# Patient Record
Sex: Male | Born: 1940 | Race: White | Hispanic: No | Marital: Married | Smoking: Never smoker
Health system: Southern US, Community
[De-identification: ages and names within clinical notes are randomized; demographics above are authoritative.]

## PROBLEM LIST (undated history)

## (undated) DIAGNOSIS — G4733 Obstructive sleep apnea (adult) (pediatric): Secondary | ICD-10-CM

## (undated) DIAGNOSIS — J4489 Other specified chronic obstructive pulmonary disease: Secondary | ICD-10-CM

## (undated) DIAGNOSIS — Z95810 Presence of automatic (implantable) cardiac defibrillator: Secondary | ICD-10-CM

## (undated) DIAGNOSIS — M109 Gout, unspecified: Secondary | ICD-10-CM

## (undated) DIAGNOSIS — J45909 Unspecified asthma, uncomplicated: Secondary | ICD-10-CM

## (undated) DIAGNOSIS — M47817 Spondylosis without myelopathy or radiculopathy, lumbosacral region: Secondary | ICD-10-CM

## (undated) DIAGNOSIS — R531 Weakness: Secondary | ICD-10-CM

## (undated) DIAGNOSIS — I214 Non-ST elevation (NSTEMI) myocardial infarction: Secondary | ICD-10-CM

## (undated) DIAGNOSIS — K449 Diaphragmatic hernia without obstruction or gangrene: Secondary | ICD-10-CM

## (undated) DIAGNOSIS — I350 Nonrheumatic aortic (valve) stenosis: Secondary | ICD-10-CM

## (undated) DIAGNOSIS — H409 Unspecified glaucoma: Secondary | ICD-10-CM

## (undated) DIAGNOSIS — I10 Essential (primary) hypertension: Secondary | ICD-10-CM

## (undated) DIAGNOSIS — K279 Peptic ulcer, site unspecified, unspecified as acute or chronic, without hemorrhage or perforation: Secondary | ICD-10-CM

## (undated) DIAGNOSIS — I951 Orthostatic hypotension: Secondary | ICD-10-CM

## (undated) DIAGNOSIS — E662 Morbid (severe) obesity with alveolar hypoventilation: Secondary | ICD-10-CM

## (undated) DIAGNOSIS — M170 Bilateral primary osteoarthritis of knee: Secondary | ICD-10-CM

## (undated) DIAGNOSIS — K219 Gastro-esophageal reflux disease without esophagitis: Secondary | ICD-10-CM

## (undated) DIAGNOSIS — I872 Venous insufficiency (chronic) (peripheral): Secondary | ICD-10-CM

## (undated) DIAGNOSIS — I495 Sick sinus syndrome: Secondary | ICD-10-CM

## (undated) DIAGNOSIS — E669 Obesity, unspecified: Secondary | ICD-10-CM

## (undated) DIAGNOSIS — M2010 Hallux valgus (acquired), unspecified foot: Secondary | ICD-10-CM

## (undated) DIAGNOSIS — G609 Hereditary and idiopathic neuropathy, unspecified: Secondary | ICD-10-CM

## (undated) DIAGNOSIS — Z789 Other specified health status: Secondary | ICD-10-CM

## (undated) DIAGNOSIS — D509 Iron deficiency anemia, unspecified: Secondary | ICD-10-CM

## (undated) DIAGNOSIS — R740 Nonspecific elevation of levels of transaminase and lactic acid dehydrogenase [LDH]: Secondary | ICD-10-CM

## (undated) DIAGNOSIS — Z86010 Personal history of colonic polyps: Secondary | ICD-10-CM

## (undated) DIAGNOSIS — K439 Ventral hernia without obstruction or gangrene: Secondary | ICD-10-CM

## (undated) DIAGNOSIS — G894 Chronic pain syndrome: Secondary | ICD-10-CM

## (undated) DIAGNOSIS — E291 Testicular hypofunction: Secondary | ICD-10-CM

## (undated) DIAGNOSIS — M1990 Unspecified osteoarthritis, unspecified site: Secondary | ICD-10-CM

## (undated) DIAGNOSIS — G629 Polyneuropathy, unspecified: Secondary | ICD-10-CM

## (undated) DIAGNOSIS — I2510 Atherosclerotic heart disease of native coronary artery without angina pectoris: Secondary | ICD-10-CM

## (undated) DIAGNOSIS — Z8669 Personal history of other diseases of the nervous system and sense organs: Secondary | ICD-10-CM

## (undated) DIAGNOSIS — E118 Type 2 diabetes mellitus with unspecified complications: Secondary | ICD-10-CM

## (undated) DIAGNOSIS — M5417 Radiculopathy, lumbosacral region: Secondary | ICD-10-CM

## (undated) DIAGNOSIS — N400 Enlarged prostate without lower urinary tract symptoms: Secondary | ICD-10-CM

## (undated) DIAGNOSIS — J449 Chronic obstructive pulmonary disease, unspecified: Secondary | ICD-10-CM

## (undated) DIAGNOSIS — Z8679 Personal history of other diseases of the circulatory system: Secondary | ICD-10-CM

## (undated) DIAGNOSIS — R5381 Other malaise: Secondary | ICD-10-CM

## (undated) DIAGNOSIS — J189 Pneumonia, unspecified organism: Secondary | ICD-10-CM

## (undated) DIAGNOSIS — N481 Balanitis: Secondary | ICD-10-CM

## (undated) DIAGNOSIS — D696 Thrombocytopenia, unspecified: Secondary | ICD-10-CM

## (undated) DIAGNOSIS — Z9989 Dependence on other enabling machines and devices: Secondary | ICD-10-CM

## (undated) DIAGNOSIS — I5042 Chronic combined systolic (congestive) and diastolic (congestive) heart failure: Secondary | ICD-10-CM

## (undated) DIAGNOSIS — R0602 Shortness of breath: Secondary | ICD-10-CM

## (undated) DIAGNOSIS — E7800 Pure hypercholesterolemia, unspecified: Secondary | ICD-10-CM

## (undated) DIAGNOSIS — D649 Anemia, unspecified: Secondary | ICD-10-CM

## (undated) DIAGNOSIS — I480 Paroxysmal atrial fibrillation: Secondary | ICD-10-CM

## (undated) DIAGNOSIS — K760 Fatty (change of) liver, not elsewhere classified: Secondary | ICD-10-CM

## (undated) HISTORY — PX: CARDIAC CATHETERIZATION: SHX172

## (undated) HISTORY — DX: Bilateral primary osteoarthritis of knee: M17.0

## (undated) HISTORY — DX: Testicular hypofunction: E29.1

## (undated) HISTORY — DX: Balanitis: N48.1

## (undated) HISTORY — DX: Atherosclerotic heart disease of native coronary artery without angina pectoris: I25.10

## (undated) HISTORY — DX: Peptic ulcer, site unspecified, unspecified as acute or chronic, without hemorrhage or perforation: K27.9

## (undated) HISTORY — DX: Other specified chronic obstructive pulmonary disease: J44.89

## (undated) HISTORY — DX: Anemia, unspecified: D64.9

## (undated) HISTORY — PX: LEG SURGERY: SHX1003

## (undated) HISTORY — DX: Orthostatic hypotension: I95.1

## (undated) HISTORY — DX: Obstructive sleep apnea (adult) (pediatric): G47.33

## (undated) HISTORY — PX: KNEE SURGERY: SHX244

## (undated) HISTORY — DX: Personal history of other diseases of the circulatory system: Z86.79

## (undated) HISTORY — DX: Dependence on other enabling machines and devices: Z99.89

## (undated) HISTORY — DX: Pure hypercholesterolemia, unspecified: E78.00

## (undated) HISTORY — DX: Sick sinus syndrome: I49.5

## (undated) HISTORY — DX: Morbid (severe) obesity with alveolar hypoventilation: E66.2

## (undated) HISTORY — DX: Iron deficiency anemia, unspecified: D50.9

## (undated) HISTORY — PX: OTHER SURGICAL HISTORY: SHX169

## (undated) HISTORY — DX: Personal history of other diseases of the nervous system and sense organs: Z86.69

## (undated) HISTORY — DX: Other specified health status: Z78.9

## (undated) HISTORY — DX: Weakness: R53.1

## (undated) HISTORY — DX: Morbid (severe) obesity due to excess calories: E66.01

## (undated) HISTORY — PX: CORONARY ANGIOPLASTY WITH STENT PLACEMENT: SHX49

## (undated) HISTORY — DX: Type 2 diabetes mellitus with unspecified complications: E11.8

## (undated) HISTORY — DX: Hereditary and idiopathic neuropathy, unspecified: G60.9

## (undated) HISTORY — DX: Ventral hernia without obstruction or gangrene: K43.9

## (undated) HISTORY — PX: TONSILLECTOMY: SUR1361

## (undated) HISTORY — PX: TRANSESOPHAGEAL ECHOCARDIOGRAM: SHX273

## (undated) HISTORY — PX: CARDIOVASCULAR STRESS TEST: SHX262

## (undated) HISTORY — DX: Other malaise: R53.81

## (undated) HISTORY — DX: Personal history of colonic polyps: Z86.010

## (undated) HISTORY — DX: Polyneuropathy, unspecified: G62.9

## (undated) HISTORY — PX: COLONOSCOPY: SHX174

## (undated) HISTORY — DX: Chronic combined systolic (congestive) and diastolic (congestive) heart failure: I50.42

## (undated) HISTORY — DX: Chronic pain syndrome: G89.4

## (undated) HISTORY — DX: Chronic obstructive pulmonary disease, unspecified: J44.9

## (undated) HISTORY — DX: Spondylosis without myelopathy or radiculopathy, lumbosacral region: M47.817

## (undated) HISTORY — DX: Unspecified glaucoma: H40.9

## (undated) HISTORY — DX: Obesity, unspecified: E66.9

## (undated) HISTORY — PX: INTRAOCULAR LENS INSERTION: SHX110

## (undated) HISTORY — DX: Radiculopathy, lumbosacral region: M54.17

## (undated) HISTORY — DX: Unspecified osteoarthritis, unspecified site: M19.90

## (undated) HISTORY — PX: EYE SURGERY: SHX253

## (undated) HISTORY — DX: Paroxysmal atrial fibrillation: I48.0

## (undated) HISTORY — PX: VITRECTOMY: SHX106

## (undated) HISTORY — DX: Thrombocytopenia, unspecified: D69.6

## (undated) HISTORY — PX: CHOLECYSTECTOMY: SHX55

## (undated) HISTORY — DX: Gout, unspecified: M10.9

## (undated) HISTORY — DX: Non-ST elevation (NSTEMI) myocardial infarction: I21.4

## (undated) HISTORY — DX: Essential (primary) hypertension: I10

## (undated) HISTORY — DX: Venous insufficiency (chronic) (peripheral): I87.2

## (undated) HISTORY — DX: Diaphragmatic hernia without obstruction or gangrene: K44.9

## (undated) HISTORY — DX: Nonrheumatic aortic (valve) stenosis: I35.0

## (undated) HISTORY — DX: Nonspecific elevation of levels of transaminase and lactic acid dehydrogenase (ldh): R74.0

## (undated) HISTORY — DX: Benign prostatic hyperplasia without lower urinary tract symptoms: N40.0

## (undated) HISTORY — PX: TOE AMPUTATION: SHX809

## (undated) HISTORY — DX: Presence of automatic (implantable) cardiac defibrillator: Z95.810

## (undated) HISTORY — DX: Hallux valgus (acquired), unspecified foot: M20.10

## (undated) HISTORY — PX: HEMORRHOID SURGERY: SHX153

## (undated) HISTORY — PX: TRANSTHORACIC ECHOCARDIOGRAM: SHX275

## (undated) HISTORY — DX: Fatty (change of) liver, not elsewhere classified: K76.0

## (undated) HISTORY — DX: Unspecified asthma, uncomplicated: J45.909

---

## 1998-04-06 ENCOUNTER — Encounter: Payer: Self-pay | Admitting: Emergency Medicine

## 1998-04-06 ENCOUNTER — Inpatient Hospital Stay (HOSPITAL_COMMUNITY): Admission: EM | Admit: 1998-04-06 | Discharge: 1998-04-07 | Payer: Self-pay | Admitting: Emergency Medicine

## 1998-09-21 ENCOUNTER — Encounter: Admission: RE | Admit: 1998-09-21 | Discharge: 1998-12-20 | Payer: Self-pay | Admitting: Anesthesiology

## 1998-11-12 ENCOUNTER — Ambulatory Visit (HOSPITAL_COMMUNITY): Admission: RE | Admit: 1998-11-12 | Discharge: 1998-11-12 | Payer: Self-pay | Admitting: Neurosurgery

## 1998-12-14 ENCOUNTER — Ambulatory Visit: Admission: RE | Admit: 1998-12-14 | Discharge: 1998-12-14 | Payer: Self-pay | Admitting: Internal Medicine

## 1999-01-24 ENCOUNTER — Inpatient Hospital Stay (HOSPITAL_COMMUNITY): Admission: EM | Admit: 1999-01-24 | Discharge: 1999-01-26 | Payer: Self-pay

## 1999-02-09 ENCOUNTER — Ambulatory Visit: Admission: RE | Admit: 1999-02-09 | Discharge: 1999-02-09 | Payer: Self-pay | Admitting: Internal Medicine

## 1999-02-09 ENCOUNTER — Encounter: Payer: Self-pay | Admitting: Internal Medicine

## 1999-02-21 ENCOUNTER — Encounter: Payer: Self-pay | Admitting: Internal Medicine

## 1999-02-21 ENCOUNTER — Ambulatory Visit (HOSPITAL_COMMUNITY): Admission: RE | Admit: 1999-02-21 | Discharge: 1999-02-21 | Payer: Self-pay | Admitting: Internal Medicine

## 1999-03-29 ENCOUNTER — Inpatient Hospital Stay (HOSPITAL_COMMUNITY): Admission: EM | Admit: 1999-03-29 | Discharge: 1999-03-30 | Payer: Self-pay | Admitting: Emergency Medicine

## 1999-03-29 ENCOUNTER — Encounter: Payer: Self-pay | Admitting: Emergency Medicine

## 1999-03-30 ENCOUNTER — Encounter: Payer: Self-pay | Admitting: Emergency Medicine

## 1999-04-11 ENCOUNTER — Encounter: Admission: RE | Admit: 1999-04-11 | Discharge: 1999-07-07 | Payer: Self-pay | Admitting: Anesthesiology

## 1999-06-29 ENCOUNTER — Encounter: Admission: RE | Admit: 1999-06-29 | Discharge: 1999-07-12 | Payer: Self-pay | Admitting: Anesthesiology

## 1999-07-07 ENCOUNTER — Encounter: Admission: RE | Admit: 1999-07-07 | Discharge: 1999-09-16 | Payer: Self-pay | Admitting: Anesthesiology

## 1999-08-17 ENCOUNTER — Ambulatory Visit (HOSPITAL_COMMUNITY): Admission: RE | Admit: 1999-08-17 | Discharge: 1999-08-17 | Payer: Self-pay | Admitting: Internal Medicine

## 1999-09-24 ENCOUNTER — Ambulatory Visit: Admission: RE | Admit: 1999-09-24 | Discharge: 1999-09-24 | Payer: Self-pay | Admitting: Internal Medicine

## 1999-11-30 ENCOUNTER — Observation Stay (HOSPITAL_COMMUNITY): Admission: EM | Admit: 1999-11-30 | Discharge: 1999-12-01 | Payer: Self-pay | Admitting: *Deleted

## 1999-12-14 ENCOUNTER — Encounter: Admission: RE | Admit: 1999-12-14 | Discharge: 2000-03-13 | Payer: Self-pay | Admitting: Anesthesiology

## 2000-04-05 ENCOUNTER — Encounter: Payer: Self-pay | Admitting: Orthopedic Surgery

## 2000-04-05 ENCOUNTER — Encounter: Admission: RE | Admit: 2000-04-05 | Discharge: 2000-04-05 | Payer: Self-pay | Admitting: Orthopedic Surgery

## 2000-04-19 ENCOUNTER — Encounter: Payer: Self-pay | Admitting: Orthopedic Surgery

## 2000-04-19 ENCOUNTER — Encounter: Admission: RE | Admit: 2000-04-19 | Discharge: 2000-04-19 | Payer: Self-pay | Admitting: Orthopedic Surgery

## 2000-04-23 ENCOUNTER — Ambulatory Visit (HOSPITAL_BASED_OUTPATIENT_CLINIC_OR_DEPARTMENT_OTHER): Admission: RE | Admit: 2000-04-23 | Discharge: 2000-04-23 | Payer: Self-pay | Admitting: Orthopedic Surgery

## 2000-06-14 ENCOUNTER — Ambulatory Visit (HOSPITAL_COMMUNITY): Admission: RE | Admit: 2000-06-14 | Discharge: 2000-06-14 | Payer: Self-pay | Admitting: Orthopedic Surgery

## 2000-11-09 ENCOUNTER — Ambulatory Visit (HOSPITAL_COMMUNITY): Admission: AD | Admit: 2000-11-09 | Discharge: 2000-11-10 | Payer: Self-pay | Admitting: Ophthalmology

## 2001-07-03 DIAGNOSIS — I480 Paroxysmal atrial fibrillation: Secondary | ICD-10-CM

## 2001-07-03 HISTORY — DX: Paroxysmal atrial fibrillation: I48.0

## 2001-11-08 ENCOUNTER — Inpatient Hospital Stay (HOSPITAL_COMMUNITY): Admission: EM | Admit: 2001-11-08 | Discharge: 2001-11-09 | Payer: Self-pay | Admitting: Emergency Medicine

## 2001-11-08 ENCOUNTER — Encounter: Payer: Self-pay | Admitting: Emergency Medicine

## 2003-01-12 ENCOUNTER — Encounter: Admission: RE | Admit: 2003-01-12 | Discharge: 2003-01-12 | Payer: Self-pay | Admitting: Orthopedic Surgery

## 2003-01-12 ENCOUNTER — Encounter: Payer: Self-pay | Admitting: Orthopedic Surgery

## 2004-05-10 ENCOUNTER — Ambulatory Visit: Payer: Self-pay | Admitting: Cardiology

## 2004-05-24 ENCOUNTER — Ambulatory Visit: Payer: Self-pay | Admitting: Internal Medicine

## 2004-05-31 ENCOUNTER — Ambulatory Visit: Payer: Self-pay | Admitting: Internal Medicine

## 2004-06-07 ENCOUNTER — Ambulatory Visit: Payer: Self-pay | Admitting: Cardiology

## 2004-06-14 ENCOUNTER — Ambulatory Visit: Payer: Self-pay | Admitting: Cardiology

## 2004-07-05 ENCOUNTER — Ambulatory Visit: Payer: Self-pay | Admitting: *Deleted

## 2004-10-12 ENCOUNTER — Encounter: Payer: Self-pay | Admitting: Cardiology

## 2004-10-25 ENCOUNTER — Ambulatory Visit: Payer: Self-pay | Admitting: Internal Medicine

## 2004-12-23 ENCOUNTER — Encounter: Payer: Self-pay | Admitting: Cardiology

## 2005-01-20 ENCOUNTER — Encounter: Payer: Self-pay | Admitting: Cardiology

## 2005-01-31 ENCOUNTER — Ambulatory Visit: Payer: Self-pay | Admitting: Internal Medicine

## 2005-02-01 ENCOUNTER — Ambulatory Visit: Payer: Self-pay | Admitting: Internal Medicine

## 2005-04-24 ENCOUNTER — Ambulatory Visit: Payer: Self-pay | Admitting: Internal Medicine

## 2005-04-28 ENCOUNTER — Ambulatory Visit: Payer: Self-pay | Admitting: Internal Medicine

## 2005-05-23 ENCOUNTER — Ambulatory Visit: Payer: Self-pay | Admitting: Internal Medicine

## 2005-06-01 ENCOUNTER — Ambulatory Visit: Payer: Self-pay

## 2005-07-03 DIAGNOSIS — K760 Fatty (change of) liver, not elsewhere classified: Secondary | ICD-10-CM

## 2005-07-03 HISTORY — DX: Fatty (change of) liver, not elsewhere classified: K76.0

## 2005-07-04 ENCOUNTER — Ambulatory Visit: Payer: Self-pay | Admitting: Internal Medicine

## 2005-07-11 ENCOUNTER — Ambulatory Visit: Payer: Self-pay | Admitting: Infectious Diseases

## 2005-07-11 ENCOUNTER — Inpatient Hospital Stay (HOSPITAL_COMMUNITY): Admission: EM | Admit: 2005-07-11 | Discharge: 2005-07-15 | Payer: Self-pay | Admitting: Emergency Medicine

## 2005-07-11 ENCOUNTER — Ambulatory Visit: Payer: Self-pay | Admitting: Internal Medicine

## 2005-07-24 ENCOUNTER — Ambulatory Visit: Payer: Self-pay | Admitting: Internal Medicine

## 2005-07-28 ENCOUNTER — Ambulatory Visit: Payer: Self-pay | Admitting: Infectious Diseases

## 2005-08-08 ENCOUNTER — Ambulatory Visit: Payer: Self-pay | Admitting: Infectious Diseases

## 2005-08-08 ENCOUNTER — Ambulatory Visit: Payer: Self-pay | Admitting: Endocrinology

## 2005-08-08 ENCOUNTER — Ambulatory Visit: Payer: Self-pay | Admitting: Cardiology

## 2005-08-08 ENCOUNTER — Inpatient Hospital Stay (HOSPITAL_COMMUNITY): Admission: EM | Admit: 2005-08-08 | Discharge: 2005-08-12 | Payer: Self-pay | Admitting: Emergency Medicine

## 2005-08-09 ENCOUNTER — Encounter: Payer: Self-pay | Admitting: Cardiology

## 2005-08-23 ENCOUNTER — Ambulatory Visit: Payer: Self-pay | Admitting: Internal Medicine

## 2005-08-31 ENCOUNTER — Ambulatory Visit: Payer: Self-pay | Admitting: Internal Medicine

## 2005-09-13 ENCOUNTER — Ambulatory Visit: Payer: Self-pay | Admitting: Internal Medicine

## 2006-02-01 ENCOUNTER — Ambulatory Visit: Payer: Self-pay | Admitting: Internal Medicine

## 2006-02-19 ENCOUNTER — Ambulatory Visit: Payer: Self-pay | Admitting: Internal Medicine

## 2006-04-02 ENCOUNTER — Encounter (INDEPENDENT_AMBULATORY_CARE_PROVIDER_SITE_OTHER): Payer: Self-pay | Admitting: *Deleted

## 2006-04-02 ENCOUNTER — Ambulatory Visit (HOSPITAL_COMMUNITY): Admission: RE | Admit: 2006-04-02 | Discharge: 2006-04-02 | Payer: Self-pay | Admitting: General Surgery

## 2006-04-12 ENCOUNTER — Encounter: Payer: Self-pay | Admitting: Cardiology

## 2006-05-16 ENCOUNTER — Ambulatory Visit (HOSPITAL_COMMUNITY): Admission: RE | Admit: 2006-05-16 | Discharge: 2006-05-17 | Payer: Self-pay | Admitting: Orthopedic Surgery

## 2006-05-25 ENCOUNTER — Ambulatory Visit: Payer: Self-pay | Admitting: Internal Medicine

## 2006-05-25 ENCOUNTER — Ambulatory Visit (HOSPITAL_COMMUNITY): Admission: RE | Admit: 2006-05-25 | Discharge: 2006-05-25 | Payer: Self-pay | Admitting: Internal Medicine

## 2006-05-25 LAB — CONVERTED CEMR LAB
Ketones, ur: NEGATIVE mg/dL
Leukocytes, UA: NEGATIVE
Nitrite: NEGATIVE
Nitrite: NEGATIVE
Specific Gravity, Urine: 1.02 (ref 1.000–1.03)
Total Protein, Urine: NEGATIVE mg/dL
Total Protein, Urine: NEGATIVE mg/dL
Urobilinogen, UA: 0.2 (ref 0.0–1.0)
Urobilinogen, UA: 0.2 (ref 0.0–1.0)
pH: 6.5 (ref 5.0–8.0)

## 2006-07-02 ENCOUNTER — Ambulatory Visit: Payer: Self-pay | Admitting: Internal Medicine

## 2006-08-08 ENCOUNTER — Ambulatory Visit (HOSPITAL_COMMUNITY): Admission: RE | Admit: 2006-08-08 | Discharge: 2006-08-09 | Payer: Self-pay | Admitting: Orthopedic Surgery

## 2006-10-25 ENCOUNTER — Inpatient Hospital Stay (HOSPITAL_COMMUNITY): Admission: EM | Admit: 2006-10-25 | Discharge: 2006-10-28 | Payer: Self-pay | Admitting: Emergency Medicine

## 2006-10-25 ENCOUNTER — Ambulatory Visit: Payer: Self-pay | Admitting: Internal Medicine

## 2006-11-01 ENCOUNTER — Ambulatory Visit: Payer: Self-pay | Admitting: Internal Medicine

## 2007-01-17 ENCOUNTER — Encounter: Payer: Self-pay | Admitting: Internal Medicine

## 2007-01-17 DIAGNOSIS — K219 Gastro-esophageal reflux disease without esophagitis: Secondary | ICD-10-CM | POA: Insufficient documentation

## 2007-01-17 DIAGNOSIS — E669 Obesity, unspecified: Secondary | ICD-10-CM | POA: Insufficient documentation

## 2007-01-17 DIAGNOSIS — I272 Pulmonary hypertension, unspecified: Secondary | ICD-10-CM | POA: Insufficient documentation

## 2007-01-17 DIAGNOSIS — G4733 Obstructive sleep apnea (adult) (pediatric): Secondary | ICD-10-CM | POA: Insufficient documentation

## 2007-01-17 DIAGNOSIS — Z8679 Personal history of other diseases of the circulatory system: Secondary | ICD-10-CM

## 2007-01-17 DIAGNOSIS — J45909 Unspecified asthma, uncomplicated: Secondary | ICD-10-CM | POA: Insufficient documentation

## 2007-01-17 DIAGNOSIS — I83009 Varicose veins of unspecified lower extremity with ulcer of unspecified site: Secondary | ICD-10-CM | POA: Insufficient documentation

## 2007-01-17 DIAGNOSIS — L97909 Non-pressure chronic ulcer of unspecified part of unspecified lower leg with unspecified severity: Secondary | ICD-10-CM | POA: Insufficient documentation

## 2007-01-17 DIAGNOSIS — M2010 Hallux valgus (acquired), unspecified foot: Secondary | ICD-10-CM | POA: Insufficient documentation

## 2007-07-23 ENCOUNTER — Encounter: Payer: Self-pay | Admitting: Internal Medicine

## 2007-10-02 ENCOUNTER — Encounter: Payer: Self-pay | Admitting: Internal Medicine

## 2008-01-09 ENCOUNTER — Inpatient Hospital Stay (HOSPITAL_COMMUNITY): Admission: AD | Admit: 2008-01-09 | Discharge: 2008-01-12 | Payer: Self-pay | Admitting: Orthopedic Surgery

## 2008-01-30 ENCOUNTER — Encounter: Payer: Self-pay | Admitting: Internal Medicine

## 2008-02-26 ENCOUNTER — Encounter: Payer: Self-pay | Admitting: Cardiology

## 2008-02-26 ENCOUNTER — Encounter: Payer: Self-pay | Admitting: Internal Medicine

## 2008-05-21 ENCOUNTER — Telehealth: Payer: Self-pay | Admitting: Internal Medicine

## 2008-05-21 ENCOUNTER — Ambulatory Visit: Payer: Self-pay | Admitting: Internal Medicine

## 2008-06-29 ENCOUNTER — Telehealth: Payer: Self-pay | Admitting: Internal Medicine

## 2008-07-01 ENCOUNTER — Telehealth: Payer: Self-pay | Admitting: Internal Medicine

## 2008-07-08 ENCOUNTER — Telehealth: Payer: Self-pay | Admitting: Internal Medicine

## 2008-07-29 ENCOUNTER — Telehealth: Payer: Self-pay | Admitting: Internal Medicine

## 2008-08-05 ENCOUNTER — Encounter: Payer: Self-pay | Admitting: Internal Medicine

## 2008-08-05 ENCOUNTER — Telehealth (INDEPENDENT_AMBULATORY_CARE_PROVIDER_SITE_OTHER): Payer: Self-pay | Admitting: *Deleted

## 2008-09-03 ENCOUNTER — Encounter: Payer: Self-pay | Admitting: Internal Medicine

## 2008-09-11 ENCOUNTER — Telehealth: Payer: Self-pay | Admitting: Internal Medicine

## 2008-09-14 ENCOUNTER — Encounter: Payer: Self-pay | Admitting: Cardiology

## 2008-09-16 ENCOUNTER — Telehealth: Payer: Self-pay | Admitting: Internal Medicine

## 2008-09-25 ENCOUNTER — Encounter: Payer: Self-pay | Admitting: Cardiology

## 2008-12-17 ENCOUNTER — Encounter: Payer: Self-pay | Admitting: Cardiology

## 2008-12-17 ENCOUNTER — Ambulatory Visit: Payer: Self-pay | Admitting: Internal Medicine

## 2008-12-24 ENCOUNTER — Encounter: Payer: Self-pay | Admitting: Internal Medicine

## 2008-12-25 ENCOUNTER — Telehealth: Payer: Self-pay | Admitting: Internal Medicine

## 2008-12-25 ENCOUNTER — Ambulatory Visit (HOSPITAL_COMMUNITY): Admission: EM | Admit: 2008-12-25 | Discharge: 2008-12-25 | Payer: Self-pay | Admitting: Emergency Medicine

## 2008-12-25 ENCOUNTER — Ambulatory Visit: Payer: Self-pay | Admitting: Internal Medicine

## 2008-12-25 ENCOUNTER — Telehealth (INDEPENDENT_AMBULATORY_CARE_PROVIDER_SITE_OTHER): Payer: Self-pay

## 2009-01-12 DIAGNOSIS — K439 Ventral hernia without obstruction or gangrene: Secondary | ICD-10-CM | POA: Insufficient documentation

## 2009-01-12 DIAGNOSIS — M1990 Unspecified osteoarthritis, unspecified site: Secondary | ICD-10-CM | POA: Insufficient documentation

## 2009-01-12 DIAGNOSIS — Z8679 Personal history of other diseases of the circulatory system: Secondary | ICD-10-CM | POA: Insufficient documentation

## 2009-01-12 DIAGNOSIS — M129 Arthropathy, unspecified: Secondary | ICD-10-CM | POA: Insufficient documentation

## 2009-01-12 DIAGNOSIS — H409 Unspecified glaucoma: Secondary | ICD-10-CM | POA: Insufficient documentation

## 2009-01-12 DIAGNOSIS — IMO0002 Reserved for concepts with insufficient information to code with codable children: Secondary | ICD-10-CM | POA: Insufficient documentation

## 2009-01-12 DIAGNOSIS — I872 Venous insufficiency (chronic) (peripheral): Secondary | ICD-10-CM | POA: Insufficient documentation

## 2009-01-12 DIAGNOSIS — M109 Gout, unspecified: Secondary | ICD-10-CM | POA: Insufficient documentation

## 2009-01-12 DIAGNOSIS — Z87898 Personal history of other specified conditions: Secondary | ICD-10-CM | POA: Insufficient documentation

## 2009-01-12 DIAGNOSIS — J449 Chronic obstructive pulmonary disease, unspecified: Secondary | ICD-10-CM | POA: Insufficient documentation

## 2009-01-12 DIAGNOSIS — Z8669 Personal history of other diseases of the nervous system and sense organs: Secondary | ICD-10-CM | POA: Insufficient documentation

## 2009-01-13 ENCOUNTER — Ambulatory Visit: Payer: Self-pay | Admitting: Cardiology

## 2009-01-13 ENCOUNTER — Encounter (INDEPENDENT_AMBULATORY_CARE_PROVIDER_SITE_OTHER): Payer: Self-pay | Admitting: *Deleted

## 2009-01-13 ENCOUNTER — Encounter: Payer: Self-pay | Admitting: Cardiology

## 2009-01-13 DIAGNOSIS — E7800 Pure hypercholesterolemia, unspecified: Secondary | ICD-10-CM | POA: Insufficient documentation

## 2009-01-13 DIAGNOSIS — R609 Edema, unspecified: Secondary | ICD-10-CM | POA: Insufficient documentation

## 2009-01-13 DIAGNOSIS — I10 Essential (primary) hypertension: Secondary | ICD-10-CM | POA: Insufficient documentation

## 2009-01-22 ENCOUNTER — Encounter: Payer: Self-pay | Admitting: Cardiology

## 2009-01-22 ENCOUNTER — Encounter: Payer: Self-pay | Admitting: Internal Medicine

## 2009-01-27 LAB — CONVERTED CEMR LAB: INR: 3.1 — ABNORMAL HIGH (ref 0.0–1.5)

## 2009-02-01 ENCOUNTER — Telehealth: Payer: Self-pay | Admitting: Cardiology

## 2009-02-22 ENCOUNTER — Telehealth: Payer: Self-pay | Admitting: Internal Medicine

## 2009-02-25 ENCOUNTER — Encounter: Payer: Self-pay | Admitting: Internal Medicine

## 2009-02-25 LAB — CONVERTED CEMR LAB: Prothrombin Time: 27 s — ABNORMAL HIGH (ref 11.6–15.2)

## 2009-03-01 ENCOUNTER — Telehealth: Payer: Self-pay | Admitting: Internal Medicine

## 2009-03-11 ENCOUNTER — Encounter: Payer: Self-pay | Admitting: Internal Medicine

## 2009-03-16 ENCOUNTER — Encounter: Payer: Self-pay | Admitting: Internal Medicine

## 2009-03-17 ENCOUNTER — Encounter: Payer: Self-pay | Admitting: Internal Medicine

## 2009-03-17 LAB — CONVERTED CEMR LAB: Prothrombin Time: 21.4 s — ABNORMAL HIGH (ref 11.6–15.2)

## 2009-03-18 ENCOUNTER — Telehealth: Payer: Self-pay | Admitting: Internal Medicine

## 2009-03-30 ENCOUNTER — Encounter: Payer: Self-pay | Admitting: Internal Medicine

## 2009-03-30 ENCOUNTER — Encounter: Payer: Self-pay | Admitting: Cardiology

## 2009-03-30 ENCOUNTER — Telehealth: Payer: Self-pay | Admitting: Internal Medicine

## 2009-03-30 LAB — CONVERTED CEMR LAB
ALT: 16 units/L (ref 0–53)
Alkaline Phosphatase: 53 units/L (ref 39–117)
Bilirubin, Direct: 0.2 mg/dL (ref 0.0–0.3)
Cholesterol: 112 mg/dL (ref 0–200)
LDL Cholesterol: 30 mg/dL (ref 0–99)
Prothrombin Time: 21.5 s — ABNORMAL HIGH (ref 10.2–14.0)
Total Protein: 6.2 g/dL (ref 6.0–8.3)

## 2009-03-31 ENCOUNTER — Encounter: Payer: Self-pay | Admitting: Internal Medicine

## 2009-04-01 ENCOUNTER — Encounter: Payer: Self-pay | Admitting: Internal Medicine

## 2009-04-27 ENCOUNTER — Encounter: Payer: Self-pay | Admitting: Internal Medicine

## 2009-04-27 LAB — CONVERTED CEMR LAB
INR: 2.42 — ABNORMAL HIGH (ref <1.50)
Prothrombin Time: 20.8 s — ABNORMAL HIGH (ref 10.2–14.0)

## 2009-05-05 ENCOUNTER — Encounter: Admission: RE | Admit: 2009-05-05 | Discharge: 2009-05-05 | Payer: Self-pay | Admitting: Orthopedic Surgery

## 2009-05-20 ENCOUNTER — Encounter: Payer: Self-pay | Admitting: Internal Medicine

## 2009-05-20 LAB — CONVERTED CEMR LAB: INR: 1.37 (ref <1.50)

## 2009-06-01 ENCOUNTER — Telehealth: Payer: Self-pay | Admitting: Internal Medicine

## 2009-06-02 ENCOUNTER — Encounter: Payer: Self-pay | Admitting: Internal Medicine

## 2009-06-02 LAB — CONVERTED CEMR LAB
INR: 2.91 — ABNORMAL HIGH (ref <1.50)
Prothrombin Time: 23.3 s — ABNORMAL HIGH (ref 10.2–14.0)

## 2009-06-05 ENCOUNTER — Telehealth: Payer: Self-pay | Admitting: Internal Medicine

## 2009-06-08 ENCOUNTER — Telehealth: Payer: Self-pay | Admitting: Internal Medicine

## 2009-06-30 ENCOUNTER — Telehealth: Payer: Self-pay | Admitting: Internal Medicine

## 2009-07-06 ENCOUNTER — Encounter: Payer: Self-pay | Admitting: Internal Medicine

## 2009-07-09 ENCOUNTER — Telehealth: Payer: Self-pay | Admitting: Internal Medicine

## 2009-07-17 ENCOUNTER — Encounter: Payer: Self-pay | Admitting: Internal Medicine

## 2009-08-04 ENCOUNTER — Encounter: Payer: Self-pay | Admitting: Internal Medicine

## 2009-08-04 LAB — CONVERTED CEMR LAB: INR: 2.91 — ABNORMAL HIGH (ref <1.50)

## 2009-08-31 ENCOUNTER — Telehealth: Payer: Self-pay | Admitting: Internal Medicine

## 2009-08-31 ENCOUNTER — Encounter: Payer: Self-pay | Admitting: Internal Medicine

## 2009-09-02 ENCOUNTER — Encounter: Payer: Self-pay | Admitting: Internal Medicine

## 2009-09-08 ENCOUNTER — Ambulatory Visit: Payer: Self-pay | Admitting: Internal Medicine

## 2009-09-08 DIAGNOSIS — R0600 Dyspnea, unspecified: Secondary | ICD-10-CM | POA: Insufficient documentation

## 2009-09-08 DIAGNOSIS — R0609 Other forms of dyspnea: Secondary | ICD-10-CM

## 2009-09-10 ENCOUNTER — Encounter: Payer: Self-pay | Admitting: Cardiology

## 2009-09-10 ENCOUNTER — Encounter: Payer: Self-pay | Admitting: Internal Medicine

## 2009-09-11 ENCOUNTER — Encounter: Payer: Self-pay | Admitting: Cardiology

## 2009-09-13 ENCOUNTER — Encounter: Payer: Self-pay | Admitting: Internal Medicine

## 2009-09-23 ENCOUNTER — Encounter: Payer: Self-pay | Admitting: Internal Medicine

## 2009-09-24 LAB — CONVERTED CEMR LAB
INR: 2.03 — ABNORMAL HIGH (ref <1.50)
Prothrombin Time: 18.7 s — ABNORMAL HIGH (ref 10.2–14.0)

## 2009-10-13 ENCOUNTER — Encounter: Payer: Self-pay | Admitting: Cardiology

## 2009-10-13 ENCOUNTER — Ambulatory Visit: Payer: Self-pay | Admitting: Cardiology

## 2009-10-14 ENCOUNTER — Telehealth (INDEPENDENT_AMBULATORY_CARE_PROVIDER_SITE_OTHER): Payer: Self-pay | Admitting: *Deleted

## 2009-10-27 ENCOUNTER — Encounter: Payer: Self-pay | Admitting: Internal Medicine

## 2009-10-27 ENCOUNTER — Telehealth: Payer: Self-pay | Admitting: Internal Medicine

## 2009-10-27 LAB — CONVERTED CEMR LAB
INR: 3.03 — ABNORMAL HIGH
Prothrombin Time: 23.9 s — ABNORMAL HIGH

## 2009-11-01 ENCOUNTER — Encounter (INDEPENDENT_AMBULATORY_CARE_PROVIDER_SITE_OTHER): Payer: Self-pay | Admitting: *Deleted

## 2009-11-25 ENCOUNTER — Encounter: Payer: Self-pay | Admitting: Internal Medicine

## 2009-11-26 ENCOUNTER — Telehealth: Payer: Self-pay | Admitting: Internal Medicine

## 2009-12-02 ENCOUNTER — Encounter: Payer: Self-pay | Admitting: Internal Medicine

## 2009-12-09 ENCOUNTER — Encounter: Payer: Self-pay | Admitting: Internal Medicine

## 2009-12-27 ENCOUNTER — Encounter: Payer: Self-pay | Admitting: Internal Medicine

## 2010-01-05 ENCOUNTER — Telehealth (INDEPENDENT_AMBULATORY_CARE_PROVIDER_SITE_OTHER): Payer: Self-pay | Admitting: *Deleted

## 2010-01-05 ENCOUNTER — Telehealth: Payer: Self-pay | Admitting: Internal Medicine

## 2010-01-05 LAB — CONVERTED CEMR LAB
INR: 1.99 — ABNORMAL HIGH (ref <1.50)
INR: 3.11 — ABNORMAL HIGH (ref <1.50)
Prothrombin Time: 22.3 s — ABNORMAL HIGH (ref 11.6–15.2)
Prothrombin Time: 31.4 s — ABNORMAL HIGH (ref 11.6–15.2)

## 2010-01-13 ENCOUNTER — Ambulatory Visit: Payer: Self-pay | Admitting: Internal Medicine

## 2010-01-13 LAB — CONVERTED CEMR LAB: POC INR: 4.2

## 2010-01-27 ENCOUNTER — Ambulatory Visit: Payer: Self-pay | Admitting: Internal Medicine

## 2010-02-08 ENCOUNTER — Telehealth: Payer: Self-pay | Admitting: Internal Medicine

## 2010-02-14 ENCOUNTER — Telehealth: Payer: Self-pay | Admitting: Internal Medicine

## 2010-02-15 ENCOUNTER — Telehealth: Payer: Self-pay | Admitting: Internal Medicine

## 2010-02-22 ENCOUNTER — Ambulatory Visit: Payer: Self-pay | Admitting: Cardiovascular Disease

## 2010-02-23 ENCOUNTER — Encounter
Admission: RE | Admit: 2010-02-23 | Discharge: 2010-05-24 | Payer: Self-pay | Admitting: Physical Medicine & Rehabilitation

## 2010-02-24 ENCOUNTER — Encounter: Admission: RE | Admit: 2010-02-24 | Discharge: 2010-02-24 | Payer: Self-pay | Admitting: Orthopedic Surgery

## 2010-03-01 ENCOUNTER — Encounter: Payer: Self-pay | Admitting: Cardiology

## 2010-03-01 ENCOUNTER — Ambulatory Visit: Payer: Self-pay | Admitting: Physical Medicine & Rehabilitation

## 2010-03-16 ENCOUNTER — Ambulatory Visit: Payer: Self-pay | Admitting: Internal Medicine

## 2010-03-16 LAB — CONVERTED CEMR LAB: POC INR: 1.4

## 2010-03-18 ENCOUNTER — Ambulatory Visit: Payer: Self-pay | Admitting: Physical Medicine & Rehabilitation

## 2010-03-18 ENCOUNTER — Ambulatory Visit: Payer: Self-pay | Admitting: Cardiology

## 2010-03-18 LAB — CONVERTED CEMR LAB: POC INR: 1

## 2010-03-23 ENCOUNTER — Ambulatory Visit: Payer: Self-pay | Admitting: Cardiology

## 2010-03-25 ENCOUNTER — Ambulatory Visit: Payer: Self-pay | Admitting: Internal Medicine

## 2010-04-06 ENCOUNTER — Ambulatory Visit: Payer: Self-pay | Admitting: Cardiovascular Disease

## 2010-04-07 ENCOUNTER — Ambulatory Visit: Payer: Self-pay | Admitting: Physical Medicine & Rehabilitation

## 2010-04-21 ENCOUNTER — Ambulatory Visit: Payer: Self-pay | Admitting: Physical Medicine & Rehabilitation

## 2010-04-21 ENCOUNTER — Ambulatory Visit: Payer: Self-pay | Admitting: Internal Medicine

## 2010-04-28 ENCOUNTER — Ambulatory Visit: Payer: Self-pay | Admitting: Internal Medicine

## 2010-05-03 ENCOUNTER — Emergency Department (HOSPITAL_COMMUNITY): Admission: EM | Admit: 2010-05-03 | Discharge: 2010-05-03 | Payer: Self-pay | Admitting: Emergency Medicine

## 2010-05-11 ENCOUNTER — Ambulatory Visit: Payer: Self-pay | Admitting: Cardiovascular Disease

## 2010-05-11 LAB — CONVERTED CEMR LAB: POC INR: 3.1

## 2010-05-16 ENCOUNTER — Telehealth: Payer: Self-pay | Admitting: Internal Medicine

## 2010-06-01 ENCOUNTER — Ambulatory Visit: Payer: Self-pay | Admitting: Internal Medicine

## 2010-06-01 ENCOUNTER — Encounter
Admission: RE | Admit: 2010-06-01 | Discharge: 2010-06-30 | Payer: Self-pay | Source: Home / Self Care | Attending: Physical Medicine & Rehabilitation | Admitting: Physical Medicine & Rehabilitation

## 2010-06-09 ENCOUNTER — Encounter: Payer: Self-pay | Admitting: Internal Medicine

## 2010-06-09 ENCOUNTER — Ambulatory Visit: Payer: Self-pay | Admitting: Physical Medicine & Rehabilitation

## 2010-06-22 ENCOUNTER — Ambulatory Visit: Payer: Self-pay | Admitting: Cardiology

## 2010-06-22 LAB — CONVERTED CEMR LAB: POC INR: 2.2

## 2010-06-23 ENCOUNTER — Encounter: Payer: Self-pay | Admitting: Cardiology

## 2010-06-23 ENCOUNTER — Encounter: Payer: Self-pay | Admitting: Internal Medicine

## 2010-07-05 ENCOUNTER — Other Ambulatory Visit: Payer: Self-pay | Admitting: Internal Medicine

## 2010-07-05 ENCOUNTER — Ambulatory Visit
Admission: RE | Admit: 2010-07-05 | Discharge: 2010-07-05 | Payer: Self-pay | Source: Home / Self Care | Attending: Internal Medicine | Admitting: Internal Medicine

## 2010-07-05 LAB — HEMOGLOBIN A1C: Hgb A1c MFr Bld: 5.8 % (ref 4.6–6.5)

## 2010-07-06 ENCOUNTER — Telehealth: Payer: Self-pay | Admitting: Internal Medicine

## 2010-07-08 ENCOUNTER — Encounter: Payer: Self-pay | Admitting: Cardiology

## 2010-07-08 ENCOUNTER — Encounter
Admission: RE | Admit: 2010-07-08 | Discharge: 2010-08-02 | Payer: Self-pay | Source: Home / Self Care | Attending: Physical Medicine & Rehabilitation | Admitting: Physical Medicine & Rehabilitation

## 2010-07-08 ENCOUNTER — Ambulatory Visit
Admission: RE | Admit: 2010-07-08 | Discharge: 2010-07-08 | Payer: Self-pay | Source: Home / Self Care | Attending: Physical Medicine & Rehabilitation | Admitting: Physical Medicine & Rehabilitation

## 2010-07-20 ENCOUNTER — Ambulatory Visit: Admission: RE | Admit: 2010-07-20 | Discharge: 2010-07-20 | Payer: Self-pay | Source: Home / Self Care

## 2010-07-20 LAB — CONVERTED CEMR LAB: POC INR: 3.3

## 2010-07-26 NOTE — Assessment & Plan Note (Addendum)
CHIEF COMPLAINT:  Lower extremity pain.  A 70 year old male who I have been seeing for his multiple chronic pain issues.  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  He has evidence of polyneuropathy as well.  Appears to be axonal in the upper mainly sensory and lower appears to be sensory motor, although limited study in the lowers due to severe edema.  Pain is worse with walking, bending, sitting, standing.  Improves with rest, medications, and injections.  He can walk 5 minutes at a time, can climb steps and drives.  REVIEW OF SYSTEMS:  Positive for limb swelling, numbness, shortness of breath, sleep apnea.  Other medical problems include hypertension, COPD, glaucoma.  SOCIAL HISTORY:  Married.  PHYSICAL EXAMINATION:  VITAL SIGNS:  Blood pressure 143/66, pulse 81, respirations 18, o2 sat 98% on room air. GENERAL:  Mood and affect are appropriate. MUSCULOSKELETAL:  His back has no tenderness to palpation.  He has negative straight leg raising.  He has bilaterally decreased S1 reflexes as well as decreased patellar reflexes.  IMPRESSION: 1. S1 radiculitis, set him up for repeat S1 injections, will need to     be off Coumadin.  Discussed with patient, agrees with plan. 2. Continue his current medications including Ultracet one p.o. t.i.d.     and gabapentin 400 t.i.d.     Charlett Blake, M.D. Electronically Signed    AEK/MedQ D:  07/08/2010 17:06:49  T:  07/09/2010 01:20:52  Job #:  [id number]

## 2010-08-02 NOTE — Progress Notes (Signed)
  Phone Note Refill Request Message from:  Fax from Pharmacy on February 08, 2010 4:40 PM  Refills Requested: Medication #1:  OMEPRAZOLE 10 MG CPDR Take 1 capsule by mouth once a day Initial call taken by: Ami Bullins CMA,  February 08, 2010 4:40 PM    Prescriptions: OMEPRAZOLE 10 MG CPDR (OMEPRAZOLE) Take 1 capsule by mouth once a day  #30 x 4   Entered by:   Ami Bullins CMA   Authorized by:   Neena Rhymes MD   Signed by:   Charlynne Cousins CMA on 02/08/2010   Method used:   Electronically to        [id number] (retail)       [address]       [place], [place]  [address]       Ph: [phone]       Fax: [phone]   RxID:   [id number]

## 2010-08-02 NOTE — Assessment & Plan Note (Signed)
Summary: [facility]   Visit Type:  Follow-up Primary Provider:  Neena Rhymes MD  CC:  Sob.  History of Present Illness: 70 yo male who I have seen in the past for paroxysmal atrial fibrillation, sleep apnea, and pedal edema. Note he did have a cardiac catheterization in March of 1998 that showed normal LV function and a 30-50% right coronary artery. A nuclear study was performed in May of 2004 that showed inferior thinning but no ischemia. His ejection fraction was 57%. Last echocardiogram in February of 2007 showed normal LV function, diastolic dysfunction, trivial mitral regurgitation, probable PFO. Following 2004 he transferred his care to [facility] for convenience; however he now prefers to be followed in [place]. I last saw him in July of 2010. Since then he was admitted to [facility] in March with bronchitis and respiratory insufficiency. He was having right-sided chest pain as well but enzymes were negative and an echocardiogram was said to be grossly normal. He was treated with antibiotics, steroids and bronchodilators with improvement. He now has dyspnea on exertion but much improved. It is relieved with rest. It is not associated with chest pain. There is no orthopnea or PND. He has chronic pedal edema which is unchanged. He is not having syncope. He has a rare "skip" but no sustained palpitations.  Current Medications (verified): 1)  Advair Diskus 250-50 Mcg/dose  Misc (Fluticasone-Salmeterol) .[name].. 1 Puff Two Times A Day 2)  Lasix 40 Mg  Tabs (Furosemide) .[name].. 1 Two Times A Day 3)  Allopurinol 300 Mg  Tabs (Allopurinol) .[name].. 1 Once Daily 4)  Omeprazole 10 Mg Cpdr (Omeprazole) .... Take 1 Capsule By Mouth Once A Day 5)  Crestor 40 Mg Tabs (Rosuvastatin Calcium) .... Take 1 Tablet By Mouth Once A Day 6)  Metoprolol Succinate 200 Mg  Tb24 (Metoprolol Succinate) .[name].. 1 Two Times A Day 7)  Cozaar 50 Mg  Tabs (Losartan Potassium) .[name].. 1 Two Times A Day 8)  Spiriva  Handihaler 18 Mcg  Caps (Tiotropium Bromide Monohydrate) .[name].. 1 Once Daily 9)  Niaspan 1000 Mg  Tbcr (Niacin (Antihyperlipidemic)) .... 2030m Daily 10)  Mobic 15 Mg  Tabs (Meloxicam) ..[name]. 1 Once Daily 11)  Oxycodone Hcl 10 Mg Tabs (Oxycodone Hcl) ..[name]. 1 By Mouth Q 6 As Needed 12)  Omega-3 350 Mg  Caps (Omega-3 Fatty Acids) .... Once Daily 13)  Gabapentin 100 Mg Caps (Gabapentin) .... Take 5 Two Times A Day 14)  Finasteride 5 Mg Tabs (Finasteride) ..[name]. 1 By Mouth Once Daily 15)  Multivitamins   Tabs (Multiple Vitamin) .... Take 1 Tablet By Mouth Once A Day 16)  Vitamin E 600 Unit  Caps (Vitamin E) .... Take 1 Capsule By Mouth Once A Day 17)  Vitamin C 500 Mg  Tabs (Ascorbic Acid) .... Take 1 Tablet By Mouth Once A Day 18)  Albuterol Sulfate (2.5 Mg/321m 0.083% Nebu (Albuterol Sulfate) ...[name] 1 - 2 Puffs As Needed 19)  Cpap .... At Bedtime 20)  Zetia 10 Mg Tabs (Ezetimibe) .... Take One Tablet By Mouth Daily. 21)  Flonase 50 Mcg/act Susp (Fluticasone Propionate) .... As Needed 22)  Furosemide 40 Mg Tabs (Furosemide) .... Take 1 Tablet By Mouth Two Times A Day  Allergies: 1)  ! Vioxx 2)  ! Celebrex 3)  ! Codeine 4)  ! * Meclizine 5)  ! * Temoptic 6)  ! * Alphagan 7)  ! * Colchicine 8)  ! * Xalatan 9)  ! * Azopt 10)  !  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  hemorrhoidectomy  vitrectomy, Length left and right calf muscle replace left eye lens implant  trimmed knee cartilage right knee,  implant lens left eye  traeculectomy left eye cellulitis in legs x 4 Tonsillectomy  Social History: Reviewed history from 01/13/2009 and no changes required. [name] -BS, [employer] MS-engineering science married '67 - 10years/divorced; married '80 - 3 years, divorced; '87 no children - one adopted daughter (estranged) work: retired Animal nutritionist. Tobacco Use - No.  Alcohol Use - no Retired   Review of Systems       Problems with arthralgias but no fevers or chills, productive cough, hemoptysis, dysphasia, odynophagia, melena, hematochezia, dysuria, hematuria, rash, seizure activity, orthopnea, PND,  claudication. Remaining systems are negative.   Vital Signs:  Patient profile:   70 year old male Height:      74 inches Weight:      375 pounds BMI:     48.32 Pulse rate:   102 / minute Pulse rhythm:   irregular Resp:     20 per minute BP sitting:   150 / 70  (right arm) Cuff size:   large  Vitals Entered By: Sidney Ace (October 13, 2009 4:33 PM)  Physical Exam  General:  Well-developed obese in no acute distress.  Skin is warm and dry.  HEENT is normal.  Neck is supple. No thyromegaly.  Chest is clear to auscultation with normal expansion.  Cardiovascular exam is regular rate and rhythm. 2/6 systolic murmur left sternal border. S2 is not diminished. Abdominal exam nontender or distended. No masses palpated. Extremities show 1+ edema. neuro grossly intact    EKG  Procedure date:  09/08/2008  Findings:      sinus rhythm with right bundle branch block  Impression & Recommendations:  Problem # 1:  COUMADIN THERAPY (ICD-V58.61) Managed by primary care. Goal INR 2-3.  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 Lipids and liver monitored by primary care. His updated medication list for this problem includes:    Crestor 40 Mg Tabs (Rosuvastatin calcium) .[name]... Take 1 tablet by mouth once a day    Niaspan 1000 Mg Tbcr (Niacin (antihyperlipidemic)) .[name]... 2032m daily    Zetia 10 Mg Tabs (Ezetimibe) ..[name].. Take one tablet by mouth daily.  Problem # 3:  ESSENTIAL HYPERTENSION, BENIGN (ICD-401.1) Continue present blood pressure medications. Renal function and potassium monitored by primary care. His updated medication list for this problem includes:    Lasix 40 Mg Tabs (Furosemide) ..[name].. 1 two times a day    Metoprolol Succinate 200 Mg Tb24 (Metoprolol succinate) ..[name].. 1 two times a day    Cozaar 50 Mg Tabs (Losartan potassium) ..[name].. 1 two times a day    Furosemide 40 Mg Tabs (Furosemide) ..[name].. Take 1 tablet by mouth two times a day  Problem # 4:  EDEMA (ICD-782.3) Continue present dose of diuretics.  Problem # 5:  ATRIAL FIBRILLATION (ICD-427.31) Patient in  sinus rhythm on physical examination. Continue beta blocker and Coumadin. His updated medication list for this problem includes:    Metoprolol Succinate 200 Mg Tb24 (Metoprolol succinate) .[name]... 1 two times a day  Problem # 6:  COPD (ICD-496)  His updated medication list for this problem includes:    Advair Diskus 250-50 Mcg/dose Misc (Fluticasone-salmeterol) .[name]... 1 puff two times a day    Spiriva Handihaler 18 Mcg Caps (Tiotropium bromide monohydrate) .[name]... 1 once daily    Albuterol Sulfate (2.5 Mg/74m) 0.083% Nebu (Albuterol sulfate) ..[name].. 1 - 2 puffs as needed  Problem # 7:  OBSTRUCTIVE SLEEP APNEA (ICD-327.23)  Problem # 8:  GERD (ICD-530.81)  His updated medication list for this problem includes:    Omeprazole 10 Mg Cpdr (Omeprazole) ..[name].. Take 1 capsule by mouth once a day  Patient Instructions: 1)  Your physician recommends that you schedule a follow-up appointment  in: one year

## 2010-08-02 NOTE — Progress Notes (Signed)
  Faxed ROI over to [facility] to [phone],Recieved records back today gave to [facility]  October 14, 2009 9:41 AM

## 2010-08-02 NOTE — Assessment & Plan Note (Signed)
Summary: discuss wt loss programs/cd   Vital Signs:  Patient profile:   70 year old male Height:      74 inches Weight:      286 pounds BMI:     36.85 O2 Sat:      98 % on Room air Temp:     98.7 degrees F oral Pulse rate:   87 / minute BP sitting:   128 / 70  (left arm) Cuff size:   large  Vitals Entered By: Estell Harpin CMA (March 25, 2010 1:19 PM)  O2 Flow:  Room air CC: Pt here to discuss weight loss program information/ request flu vaccine Is Patient Diabetic? No Pain Assessment Patient in pain? no       Does patient need assistance? Functional Status Self care Ambulation Normal   Primary Care Provider:  Neena Rhymes MD  CC:  Pt here to discuss weight loss program information/ request flu vaccine.  History of Present Illness: Patient presents to discuss wight managment. He has looked into "Opti-fast" and wanted medical counselling before moving ahead.  Current Medications (verified): 1)  Advair Diskus 250-50 Mcg/dose  Misc (Fluticasone-Salmeterol) .[name].. 1 Puff Two Times A Day 2)  Allopurinol 300 Mg  Tabs (Allopurinol) .[name].. 1 Once Daily 3)  Omeprazole 10 Mg Cpdr (Omeprazole) .... Take 1 Capsule By Mouth Once A Day 4)  Crestor 40 Mg Tabs (Rosuvastatin Calcium) .... Take 1 Tablet By Mouth Once A Day 5)  Metoprolol Succinate 200 Mg  Tb24 (Metoprolol Succinate) .[name].. 1 Two Times A Day 6)  Cozaar 50 Mg  Tabs (Losartan Potassium) .[name].. 1 Two Times A Day 7)  Spiriva Handihaler 18 Mcg  Caps (Tiotropium Bromide Monohydrate) .[name].. 1 Once Daily 8)  Niaspan 1000 Mg  Tbcr (Niacin (Antihyperlipidemic)) .... 2030m Daily 9)  Mobic 15 Mg  Tabs (Meloxicam) ..[name]. 1 Once Daily 10)  Oxycodone Hcl 10 Mg Tabs (Oxycodone Hcl) ..[name]. 1 By Mouth Q 6 As Needed 11)  Omega-3 350 Mg  Caps (Omega-3 Fatty Acids) .... Once Daily 12)  Gabapentin 100 Mg Caps (Gabapentin) .... Take 5 Two Times A Day 13)  Finasteride 5 Mg Tabs (Finasteride) ..[name]. 1 By Mouth Once Daily 14)  Multivitamins   Tabs  (Multiple Vitamin) .... Take 1 Tablet By Mouth Once A Day 15)  Vitamin E 600 Unit  Caps (Vitamin E) .... Take 1 Capsule By Mouth Once A Day 16)  Vitamin C 500 Mg  Tabs (Ascorbic Acid) .... Take 1 Tablet By Mouth Once A Day 17)  Albuterol Sulfate (2.5 Mg/339m 0.083% Nebu (Albuterol Sulfate) ...[name] 1 - 2 Puffs As Needed 18)  Cpap .... At Bedtime 19)  Zetia 10 Mg Tabs (Ezetimibe) .... Take One Tablet By Mouth Daily. 20)  Flonase 50 Mcg/act Susp (Fluticasone Propionate) .... As Needed 21)  Furosemide 40 Mg Tabs (Furosemide) .... Take 1 Tablet By Mouth Two Times A Day 22)  Coumadin 5 Mg Tabs (Warfarin Sodium) .... Take As Directed 23)  Tramadol-Acetaminophen 37.5-325 Mg Tabs (Tramadol-Acetaminophen) .... Take 1 Tablet By Mouth Three Times A Day  Allergies (verified): 1)  ! Vioxx 2)  ! Celebrex 3)  ! Codeine 4)  ! * Meclizine 5)  ! * Temoptic 6)  ! * Alphagan 7)  ! * Colchicine 8)  ! * Xalatan 9)  ! * Azopt 10)  ! 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) OBSTRUCTIVE SLEEP APNEA (ICD-327.23) OBESITY (ICD-278.00) GERD (ICD-530.81) ASTHMA (ICD-493.90) OSTEOARTHRITIS (ICD-715.90)  Past Surgical History: Last updated: 01/13/2009  hemorrhoidectomy  vitrectomy, Length left and right calf muscle replace left eye lens implant  trimmed knee cartilage right knee,  implant lens left eye  traeculectomy left eye cellulitis  in legs x 4 Tonsillectomy  Family History: Last updated: Jun 12, 2008 father-deceased @ 24: pulmonary fibrosis from asbetosis, Hypetriglyceridemia mother - deceased @ 28: CAD/MI, HTN Neg- colon or prostate cancer; DM  Social History: Last updated: 01/13/2009 [name]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 MS-engineering science married '67 - 10years/divorced; married '80 - 3 years, divorced; '87 no children - one adopted daughter (estranged) work: retired Animal nutritionist. Tobacco Use - No.  Alcohol Use - no Retired   Review of Systems       The patient complains of dyspnea on exertion and peripheral edema.  The patient denies anorexia, decreased hearing, chest pain, syncope, abdominal pain, severe indigestion/heartburn, muscle weakness, and depression.    Physical Exam  General:  obese white male in no distress Head:  Normocephalic and atraumatic without obvious abnormalities. No apparent alopecia or balding. Eyes:  pupils equal and pupils round.  C&S clear Lungs:  normal respiratory effort.   Heart:  normal rate and regular rhythm.   Abdomen:  obese Msk:  no joint swelling, no joint warmth, and no redness over joints.   Neurologic:  alert & oriented X3 and cranial nerves II-XII intact.   Skin:  turgor normal and color normal.   Psych:  Oriented X3, memory intact for recent and remote, normally interactive, and good eye contact.     Impression & Recommendations:  Problem # 1:  OBESITY (ICD-278.00) Patient presents to discuss weight management. Discussed weight managment program of smart food choices, PORTION SIZE CONTROL - along with meall of a 1000chews and multiple small feedings a day, and minimal exercise.   Plan - recommended against Opti-fast           [employer] - 300lbs; Goal - to loose 1 lb/month           may come by for weekly weigh-in.  (greater than 50% 25 min encounter on counseling and education)  Complete Medication List: 1)  Advair Diskus 250-50 Mcg/dose Misc  (Fluticasone-salmeterol) .[name].. 1 puff two times a day 2)  Allopurinol 300 Mg Tabs (Allopurinol) .[name].. 1 once daily 3)  Omeprazole 10 Mg Cpdr (Omeprazole) .... Take 1 capsule by mouth once a day 4)  Crestor 40 Mg Tabs (Rosuvastatin calcium) .... Take 1 tablet by mouth once a day 5)  Metoprolol Succinate 200 Mg Tb24 (Metoprolol succinate) .[name].. 1 two times a day 6)  Cozaar 50 Mg Tabs (Losartan potassium) .[name].. 1 two times a day 7)  Spiriva Handihaler 18 Mcg Caps (Tiotropium bromide monohydrate) .[name].. 1 once daily 8)  Niaspan 1000 Mg Tbcr (Niacin (antihyperlipidemic)) .... 2061m daily 9)  Mobic 15 Mg Tabs (Meloxicam) ..[name]. 1 once daily 10)  Oxycodone Hcl 10 Mg Tabs (Oxycodone hcl) ..[name]. 1 by mouth q 6 as needed 11)  Omega-3 350 Mg Caps (Omega-3 fatty acids) .... Once daily 12)  Gabapentin 100 Mg Caps (Gabapentin) .... Take 5 two times a day 13)  Finasteride 5 Mg Tabs (Finasteride) .[name].. 1 by mouth once daily 14)  Multivitamins Tabs (Multiple vitamin) .... Take 1 tablet by mouth once a day 15)  Vitamin E 600 Unit Caps (Vitamin e) .... Take 1 capsule by mouth once a day 16)  Vitamin C 500 Mg Tabs (Ascorbic acid) .... Take 1 tablet by mouth once a day 17)  Albuterol Sulfate (2.5 Mg/76m) 0.083% Nebu (Albuterol sulfate) ..[name]. 1 - 2 puffs as needed 18)  Cpap  .... At bedtime 19)  Zetia 10 Mg Tabs (Ezetimibe) .... Take one tablet by mouth daily. 20)  Flonase 50 Mcg/act Susp (Fluticasone propionate) .... As needed 21)  Furosemide 40 Mg Tabs (Furosemide) .... Take 1 tablet by mouth two times a day 22)  Coumadin 5 Mg Tabs (Warfarin sodium) .... Take as directed 23)  Tramadol-acetaminophen 37.5-325 Mg Tabs (Tramadol-acetaminophen) .... Take 1 tablet by mouth three times a day  Other Orders: Flu Vaccine 330yr+ MEDICARE PATIENTS (Q(M7867Administration Flu vaccine - MCR (G([id number] Vaccine Consent Questions     Do you have a history of severe allergic reactions to this vaccine? no    Any prior history of  allergic reactions to egg and/or gelatin? no    Do you have a sensitivity to the preservative Thimersol? no    Do you have a past history of Guillan-Barre Syndrome? no    Do you currently have an acute febrile illness? no    Have you ever had a severe reaction to latex? no    Vaccine information given and explained to patient? yes    Are you currently pregnant? no    Lot Number:[id number]   Exp Date:12/31/2010   Site Given  Left Deltoid IM Flu Vaccine 3y1yr MEDICARE PATIENTS (Q2(E1007dministration Flu vaccine - MCR (G0008)   .lbmedflu

## 2010-08-02 NOTE — Letter (Signed)
Summary: [facility]  [name]   Imported By: Phillis Knack 10/19/2009 09:20:41  _____________________________________________________________________  External Attachment:    Type:   Image     Comment:   External Document

## 2010-08-02 NOTE — Letter (Signed)
Summary: [facility] Medication Hold Clearance   [facility] Medication Hold Clearance   Imported By: Sallee Provencal 03/16/2010 10:55:09  _____________________________________________________________________  External Attachment:    Type:   Image     Comment:   External Document

## 2010-08-02 NOTE — Medication Information (Signed)
Summary: last seen by coumadin in 2006/jml  Anticoagulant Therapy  Managed by: Porfirio Oar, PharmD Referring MD: Stanford Breed MD, Aaron Edelman PCP: Neena Rhymes MD Supervising MD: Caryl Comes MD, Remo Lipps Indication 1: Atrial Fibrillation Lab Used: [facility] [place] Site: [place] INR POC 4.2 INR RANGE 2.0-3.0  Dietary changes: no    Health status changes: no    Bleeding/hemorrhagic complications: yes       Details: has a bleeding ulcer on toe that has been present for 5 years  Recent/future hospitalizations: no    Any changes in medication regimen? no    Recent/future dental: no  Any missed doses?: no       Is patient compliant with meds? yes      Comments: New patient to coumadin clinic.  Managed by another office prior to visit today.  Pt had no questions about coumadin.  Current Medications (verified): 1)  Advair Diskus 250-50 Mcg/dose  Misc (Fluticasone-Salmeterol) .[name].. 1 Puff Two Times A Day 2)  Allopurinol 300 Mg  Tabs (Allopurinol) .[name].. 1 Once Daily 3)  Omeprazole 10 Mg Cpdr (Omeprazole) .... Take 1 Capsule By Mouth Once A Day 4)  Crestor 40 Mg Tabs (Rosuvastatin Calcium) .... Take 1 Tablet By Mouth Once A Day 5)  Metoprolol Succinate 200 Mg  Tb24 (Metoprolol Succinate) .[name].. 1 Two Times A Day 6)  Cozaar 50 Mg  Tabs (Losartan Potassium) .[name].. 1 Two Times A Day 7)  Spiriva Handihaler 18 Mcg  Caps (Tiotropium Bromide Monohydrate) .[name].. 1 Once Daily 8)  Niaspan 1000 Mg  Tbcr (Niacin (Antihyperlipidemic)) .... 2077m Daily 9)  Mobic 15 Mg  Tabs (Meloxicam) ..[name]. 1 Once Daily 10)  Oxycodone Hcl 10 Mg Tabs (Oxycodone Hcl) ..[name]. 1 By Mouth Q 6 As Needed 11)  Omega-3 350 Mg  Caps (Omega-3 Fatty Acids) .... Once Daily 12)  Gabapentin 100 Mg Caps (Gabapentin) .... Take 5 Two Times A Day 13)  Finasteride 5 Mg Tabs (Finasteride) ..[name]. 1 By Mouth Once Daily 14)  Multivitamins   Tabs (Multiple Vitamin) .... Take 1 Tablet By Mouth Once A Day 15)  Vitamin E 600 Unit  Caps  (Vitamin E) .... Take 1 Capsule By Mouth Once A Day 16)  Vitamin C 500 Mg  Tabs (Ascorbic Acid) .... Take 1 Tablet By Mouth Once A Day 17)  Albuterol Sulfate (2.5 Mg/317m 0.083% Nebu (Albuterol Sulfate) ...[name] 1 - 2 Puffs As Needed 18)  Cpap .... At Bedtime 19)  Zetia 10 Mg Tabs (Ezetimibe) .... Take One Tablet By Mouth Daily. 20)  Flonase 50 Mcg/act Susp (Fluticasone Propionate) .... As Needed 21)  Furosemide 40 Mg Tabs (Furosemide) .... Take 1 Tablet By Mouth Two Times A Day 22)  Coumadin 5 Mg Tabs (Warfarin Sodium) .... Take As Directed  Allergies: 1)  ! Vioxx 2)  ! Celebrex 3)  ! Codeine 4)  ! * Meclizine 5)  ! * Temoptic 6)  ! * Alphagan 7)  ! * Colchicine 8)  ! * Xalatan 9)  ! * Azopt 10)  ! Vancomycin  Anticoagulation Management History:      The patient is taking warfarin and comes in today for a routine follow up visit.  Positive risk factors for bleeding include an age of 6552ears or older.  The bleeding index is 'intermediate risk'.  Positive CHADS2 values include History of HTN.  Negative CHADS2 values include Age > 7535ears old.  His last INR was 3.11.  Anticoagulation responsible provider: KlCaryl ComesD,  Remo Lipps.  INR POC: 4.2.  Cuvette Lot#: [id number].  Exp: 03/2011.    Anticoagulation Management Assessment/Plan:      The patient's current anticoagulation dose is Coumadin 5 mg tabs: take as directed.  The target INR is 2.0-3.0.  The next INR is due 01/27/2010.  Anticoagulation instructions were given to patient.  Results were reviewed/authorized by Porfirio Oar, PharmD.  He was notified by Lind Covert.         Current Anticoagulation Instructions: INR 4.2  Hold coumadin today.  Then change to 1 tab on Monday and 1/2 tab all other days.  Re-check INR in 2 weeks.

## 2010-08-02 NOTE — Medication Information (Signed)
Summary: rov/mw  Anticoagulant Therapy  Managed by: Porfirio Oar, PharmD Referring MD: Stanford Breed MD, Aaron Edelman PCP: Neena Rhymes MD Supervising MD: Olevia Perches MD, Forestine Macho Indication 1: Atrial Fibrillation Lab Used: [facility] [place] Site: [place] INR POC 1.9 INR RANGE 2.0-3.0  Dietary changes: no    Health status changes: no    Bleeding/hemorrhagic complications: no    Recent/future hospitalizations: no    Any changes in medication regimen? no    Recent/future dental: no  Any missed doses?: no       Is patient compliant with meds? yes       Allergies: 1)  ! Vioxx 2)  ! Celebrex 3)  ! Codeine 4)  ! * Meclizine 5)  ! * Temoptic 6)  ! * Alphagan 7)  ! * Colchicine 8)  ! * Xalatan 9)  ! * Azopt 10)  ! Vancomycin  Anticoagulation Management History:      The patient is taking warfarin and comes in today for a routine follow up visit.  Positive risk factors for bleeding include an age of 78 years or older.  The bleeding index is 'intermediate risk'.  Positive CHADS2 values include History of HTN.  Negative CHADS2 values include Age > 31 years old.  His last INR was 3.11.  Anticoagulation responsible provider: Olevia Perches MD, Darnell Level.  INR POC: 1.9.  Cuvette Lot#: [id number].  Exp: 04/2011.    Anticoagulation Management Assessment/Plan:      The patient's current anticoagulation dose is Coumadin 5 mg tabs: take as directed.  The target INR is 2.0-3.0.  The next INR is due 04/06/2010.  Anticoagulation instructions were given to patient.  Results were reviewed/authorized by Porfirio Oar, PharmD.  He was notified by Sanda Linger.         Prior Anticoagulation Instructions: INR 1.0  After your injection, take 1 1/2 tablets on the first day that your surgeon tells you can start the Coumadin. Then take 1 tablet on the 2nd day. After that, resume your regular schedule of 1/2 tablet everyday except for 1 tablet on Mondays. Re-check INR next week.   Current Anticoagulation  Instructions: INR 1.9  Take one tablet today, then resume one-half tablet every day except for one tablet on Monday.  Recheck in two weeks.

## 2010-08-02 NOTE — Progress Notes (Signed)
Summary: INR RESULTS  Phone Note From Other Clinic   Summary of Call: PT/ INR, Ok per Dr, repeat in 4 wks. left mess to call office back  Initial call taken by: Charlsie Quest, [place],  July 09, 2009 6:09 PM  Follow-up for Phone Call        Pt informed  Follow-up by: Charlsie Quest, [place],  July 12, 2009 9:32 AM

## 2010-08-02 NOTE — Medication Information (Signed)
Summary: rov/sp  Anticoagulant Therapy  Managed by: Tim [name], Pharmd Referring MD: Stanford Breed MD, Aaron Edelman PCP: Neena Rhymes MD Supervising MD: Tim Cancel MD, Tim [name] Indication 1: Atrial Fibrillation Lab Used: [facility] [place] Site: [place] INR POC 3.1 INR RANGE 2.0-3.0  Dietary changes: no    Health status changes: no    Bleeding/hemorrhagic complications: no    Recent/future hospitalizations: no    Any changes in medication regimen? yes       Details: Started doxycycline last week for severe leg laceration.   Recent/future dental: no  Any missed doses?: no       Is patient compliant with meds? yes       Allergies: 1)  ! Vioxx 2)  ! Celebrex 3)  ! Codeine 4)  ! * Meclizine 5)  ! * Temoptic 6)  ! * Alphagan 7)  ! * Colchicine 8)  ! * Xalatan 9)  ! * Azopt 10)  ! Vancomycin  Anticoagulation Management History:      The patient is taking warfarin and comes in today for a routine follow up visit.  Positive risk factors for bleeding include an age of 21 years or older.  The bleeding index is 'intermediate risk'.  Positive CHADS2 values include History of HTN.  Negative CHADS2 values include Age > 43 years old.  His last INR was 3.11.  Anticoagulation responsible provider: Johnsie Cancel MD, Tim [name].  INR POC: 3.1.  Cuvette Lot#: [id number].  Exp: 06/2011.    Anticoagulation Management Assessment/Plan:      The patient's current anticoagulation dose is Coumadin 5 mg tabs: take as directed.  The target INR is 2.0-3.0.  The next INR is due 06/01/2010.  Anticoagulation instructions were given to patient.  Results were reviewed/authorized by Tim [name], Pharmd.  He was notified by Tim [name] PharmD.         Prior Anticoagulation Instructions: INR 1.7  Take 1 tablet today then resume same dose of 1/2 tablet every day except 1 tablet on Monday.  Recheck INR in 2 weeks.   Current Anticoagulation Instructions: INR 3.1  Today, Wednesday, November 9th, do not take  Coumadin. Then, resume taking Coumadin 0.5 tab (2.5 mg) on all days except Coumadin 1 tab (5 mg) on Mondays. Return to clinic in 3 weeks.

## 2010-08-02 NOTE — Medication Information (Signed)
Summary: rov/cs  Anticoagulant Therapy  Managed by: Tula Nakayama, RN, BSN Referring MD: Stanford Breed MD, Aaron Edelman PCP: Neena Rhymes MD Supervising MD: Angelena Form MD, Harrell Gave Indication 1: Atrial Fibrillation Lab Used: [facility] [place] Site: [place] INR POC 2.7 INR RANGE 2.0-3.0  Dietary changes: no    Health status changes: no    Bleeding/hemorrhagic complications: no    Recent/future hospitalizations: yes       Details: 04/21/2010 - pending epidural injection.  Pt will be off coumadin for 5 days prior starting 04/15/10.    Any changes in medication regimen? no    Recent/future dental: no  Any missed doses?: no       Is patient compliant with meds? yes       Current Medications (verified): 1)  Advair Diskus 250-50 Mcg/dose  Misc (Fluticasone-Salmeterol) .[name].. 1 Puff Two Times A Day 2)  Allopurinol 300 Mg  Tabs (Allopurinol) .[name].. 1 Once Daily 3)  Omeprazole 10 Mg Cpdr (Omeprazole) .... Take 1 Capsule By Mouth Once A Day 4)  Crestor 40 Mg Tabs (Rosuvastatin Calcium) .... Take 1 Tablet By Mouth Once A Day 5)  Metoprolol Succinate 200 Mg  Tb24 (Metoprolol Succinate) .[name].. 1 Two Times A Day 6)  Cozaar 50 Mg  Tabs (Losartan Potassium) .[name].. 1 Two Times A Day 7)  Spiriva Handihaler 18 Mcg  Caps (Tiotropium Bromide Monohydrate) .[name].. 1 Once Daily 8)  Niaspan 1000 Mg  Tbcr (Niacin (Antihyperlipidemic)) .... 2063m Daily 9)  Mobic 15 Mg  Tabs (Meloxicam) ..[name]. 1 Once Daily 10)  Oxycodone Hcl 10 Mg Tabs (Oxycodone Hcl) ..[name]. 1 By Mouth Q 6 As Needed 11)  Omega-3 350 Mg  Caps (Omega-3 Fatty Acids) .... Once Daily 12)  Gabapentin 100 Mg Caps (Gabapentin) .... Take 5 Two Times A Day 13)  Finasteride 5 Mg Tabs (Finasteride) ..[name]. 1 By Mouth Once Daily 14)  Multivitamins   Tabs (Multiple Vitamin) .... Take 1 Tablet By Mouth Once A Day 15)  Vitamin E 600 Unit  Caps (Vitamin E) .... Take 1 Capsule By Mouth Once A Day 16)  Vitamin C 500 Mg  Tabs (Ascorbic Acid) .... Take 1  Tablet By Mouth Once A Day 17)  Albuterol Sulfate (2.5 Mg/362m 0.083% Nebu (Albuterol Sulfate) ...[name] 1 - 2 Puffs As Needed 18)  Cpap .... At Bedtime 19)  Zetia 10 Mg Tabs (Ezetimibe) .... Take One Tablet By Mouth Daily. 20)  Flonase 50 Mcg/act Susp (Fluticasone Propionate) .... As Needed 21)  Furosemide 40 Mg Tabs (Furosemide) .... Take 1 Tablet By Mouth Two Times A Day 22)  Coumadin 5 Mg Tabs (Warfarin Sodium) .... Take As Directed 23)  Tramadol-Acetaminophen 37.5-325 Mg Tabs (Tramadol-Acetaminophen) .... Take 1 Tablet By Mouth Three Times A Day  Allergies: 1)  ! Vioxx 2)  ! Celebrex 3)  ! Codeine 4)  ! * Meclizine 5)  ! * Temoptic 6)  ! * Alphagan 7)  ! * Colchicine 8)  ! * Xalatan 9)  ! * Azopt 10)  ! Vancomycin  Anticoagulation Management History:      The patient is taking warfarin and comes in today for a routine follow up visit.  Positive risk factors for bleeding include an age of [age] or older.  The bleeding index is 'intermediate risk'.  Positive CHADS2 values include History of HTN.  Negative CHADS2 values include Age > [age]ears old.  His last INR was 3.11.  Anticoagulation responsible provider: McAngelena FormD, ChHarrell Gave INR  POC: 2.7.  Cuvette Lot#: [id number].  Exp: 05/2011.    Anticoagulation Management Assessment/Plan:      The patient's current anticoagulation dose is Coumadin 5 mg tabs: take as directed.  The target INR is 2.0-3.0.  The next INR is due 04/21/2010.  Anticoagulation instructions were given to patient.  Results were reviewed/authorized by Tula Nakayama, RN, BSN.  He was notified by Ralene Bathe, PharmD Candidate.         Prior Anticoagulation Instructions: INR 1.9  Take one tablet today, then resume one-half tablet every day except for one tablet on Monday.  Recheck in two weeks.    Current Anticoagulation Instructions: INR 2.7  Continue Coumadin as scheduled: 1/2 tablet every day of the week except 1 tablet on Monday.  Take last dose of Coumadin  on 04/15/2010.  Return to Clinic on 04/21/2010.

## 2010-08-02 NOTE — Medication Information (Signed)
Summary: rov/ln  Anticoagulant Therapy  Managed by: Freddrick March, RN, BSN Referring MD: Stanford Breed MD, Aaron Edelman PCP: Neena Rhymes MD Supervising MD: Haroldine Laws MD, Quillian Quince Indication 1: Atrial Fibrillation Lab Used: [facility] [place] Site: [place] INR POC 2.8 INR RANGE 2.0-3.0  Dietary changes: no    Health status changes: no    Bleeding/hemorrhagic complications: no    Recent/future hospitalizations: no    Any changes in medication regimen? no    Recent/future dental: no  Any missed doses?: no       Is patient compliant with meds? yes       Allergies: 1)  ! Vioxx 2)  ! Celebrex 3)  ! Codeine 4)  ! * Meclizine 5)  ! * Temoptic 6)  ! * Alphagan 7)  ! * Colchicine 8)  ! * Xalatan 9)  ! * Azopt 10)  ! Vancomycin  Anticoagulation Management History:      The patient is taking warfarin and comes in today for a routine follow up visit.  Positive risk factors for bleeding include an age of 71 years or older.  The bleeding index is 'intermediate risk'.  Positive CHADS2 values include History of HTN.  Negative CHADS2 values include Age > 77 years old.  His last INR was 3.11.  Anticoagulation responsible provider: Bensimhon MD, Quillian Quince.  INR POC: 2.8.  Cuvette Lot#: [id number].  Exp: 04/2011.    Anticoagulation Management Assessment/Plan:      The patient's current anticoagulation dose is Coumadin 5 mg tabs: take as directed.  The target INR is 2.0-3.0.  The next INR is due 02/17/2010.  Anticoagulation instructions were given to patient.  Results were reviewed/authorized by Freddrick March, RN, BSN.  He was notified by Freddrick March RN.         Prior Anticoagulation Instructions: INR 4.2  Hold coumadin today.  Then change to 1 tab on Monday and 1/2 tab all other days.  Re-check INR in 2 weeks.  Current Anticoagulation Instructions: INR 2.8  Continue on same dosage 1/2 tablet daily except 1 tablet on Mondays.  Recheck in 3 weeks.

## 2010-08-02 NOTE — Progress Notes (Signed)
----   Converted from flag ---- ---- 01/05/2010 11:37 AM, Lorraine Lax wrote: appt 7/8 @ 11:30  ---- 01/05/2010 11:26 AM, Ophelia Charter wrote: Thanks  ---- 01/05/2010 11:13 AM, Rowe Clack MD wrote: The following orders have been entered for this patient and placed on Admin Hold:  Type:     Referral       Code:   Coumadin clinic Description:   [facility] Referral Order Date:   01/05/2010   Authorized By:   Rowe Clack MD Order #:   [id number] Clinical Notes:   eval and tx - appt this week please - see phone note 01/05/10 - thanks ------------------------------

## 2010-08-02 NOTE — Medication Information (Signed)
Summary: rov/eac  Anticoagulant Therapy  Managed by: Porfirio Oar, PharmD Referring MD: Stanford Breed MD, Aaron Edelman PCP: Neena Rhymes MD Supervising MD: Haroldine Laws MD, Quillian Quince Indication 1: Atrial Fibrillation Lab Used: [facility] [place] Site: [place] INR POC 1.7 INR RANGE 2.0-3.0  Dietary changes: no    Health status changes: no    Bleeding/hemorrhagic complications: no    Recent/future hospitalizations: no    Any changes in medication regimen? no    Recent/future dental: no  Any missed doses?: yes     Details: Restarted Coumadin 1 week ago after spinal injection   Is patient compliant with meds? yes       Allergies: 1)  ! Vioxx 2)  ! Celebrex 3)  ! Codeine 4)  ! * Meclizine 5)  ! * Temoptic 6)  ! * Alphagan 7)  ! * Colchicine 8)  ! * Xalatan 9)  ! * Azopt 10)  ! Vancomycin  Anticoagulation Management History:      The patient is taking warfarin and comes in today for a routine follow up visit.  Positive risk factors for bleeding include an age of 37 years or older.  The bleeding index is 'intermediate risk'.  Positive CHADS2 values include History of HTN.  Negative CHADS2 values include Age > 60 years old.  His last INR was 3.11.  Anticoagulation responsible provider: Bensimhon MD, Quillian Quince.  INR POC: 1.7.  Cuvette Lot#: [id number].  Exp: 06/2011.    Anticoagulation Management Assessment/Plan:      The patient's current anticoagulation dose is Coumadin 5 mg tabs: take as directed.  The target INR is 2.0-3.0.  The next INR is due 05/11/2010.  Anticoagulation instructions were given to patient.  Results were reviewed/authorized by Porfirio Oar, PharmD.  He was notified by Porfirio Oar PharmD.         Prior Anticoagulation Instructions: INR 1.2  After injection, take 1.5 tablets on day one, then take 1 tablet on day 2, then return to normal dosing schedule of 1/2 tablet all days, except 1 tablet on Mondays.  Return to clinic in 1 week.    Current  Anticoagulation Instructions: INR 1.7  Take 1 tablet today then resume same dose of 1/2 tablet every day except 1 tablet on Monday.  Recheck INR in 2 weeks.

## 2010-08-02 NOTE — Letter (Signed)
Summary: [facility]   Imported By: Bubba Hales 10/07/2009 08:42:11  _____________________________________________________________________  External Attachment:    Type:   Image     Comment:   External Document

## 2010-08-02 NOTE — Progress Notes (Signed)
Summary: RESULTS  Phone Note From Other Clinic   Summary of Call: Per MD, hold coumadin x 3 days then resume previous dose. Recheck in 2 wks. left mess to call office back. Initial call taken by: Charlsie Quest, CMA,  Nov 26, 2009 1:10 PM  Follow-up for Phone Call        left mess to call office back........Marland KitchenCharlsie Quest, CMA  Nov 30, 2009 9:40 AM   Additional Follow-up for Phone Call Additional follow up Details #1::        Pt informed  Additional Follow-up by: Charlsie Quest, CMA,  Nov 30, 2009 3:48 PM

## 2010-08-02 NOTE — Medication Information (Signed)
Summary: rov/ewj  Anticoagulant Therapy  Managed by: Porfirio Oar, PharmD Referring MD: Stanford Breed MD, Aaron Edelman PCP: Neena Rhymes MD Supervising MD: Angelena Form MD, Harrell Gave Indication 1: Atrial Fibrillation Lab Used: [facility] [place] Site: [place] INR POC 2.7 INR RANGE 2.0-3.0   Health status changes: yes       Details: infection of unknown origin 2 weeks ago, has appt. with Lugene Hitt next week to further investigate    Recent/future hospitalizations: no    Any changes in medication regimen? yes       Details: infection treated with doxycycline, will finish coure on Aug. 25  Recent/future dental: no  Any missed doses?: no       Is patient compliant with meds? yes       Allergies: 1)  ! Vioxx 2)  ! Celebrex 3)  ! Codeine 4)  ! * Meclizine 5)  ! * Temoptic 6)  ! * Alphagan 7)  ! * Colchicine 8)  ! * Xalatan 9)  ! * Azopt 10)  ! Vancomycin  Anticoagulation Management History:      The patient is taking warfarin and comes in today for a routine follow up visit.  Positive risk factors for bleeding include an age of 21 years or older.  The bleeding index is 'intermediate risk'.  Positive CHADS2 values include History of HTN.  Negative CHADS2 values include Age > 69 years old.  His last INR was 3.11.  Anticoagulation responsible Anaya Bovee: Angelena Form MD, Harrell Gave.  INR POC: 2.7.  Cuvette Lot#: [id number].  Exp: 04/2011.    Anticoagulation Management Assessment/Plan:      The patient's current anticoagulation dose is Coumadin 5 mg tabs: take as directed.  The target INR is 2.0-3.0.  The next INR is due 03/22/2010.  Anticoagulation instructions were given to patient.  Results were reviewed/authorized by Porfirio Oar, PharmD.  He was notified by Aubery Lapping, PharmD Candidate.         Prior Anticoagulation Instructions: INR 2.8  Continue on same dosage 1/2 tablet daily except 1 tablet on Mondays.  Recheck in 3 weeks.    Current Anticoagulation  Instructions: INR 2.7  Continue 1/2 tablet daily except for 1 tablet on Mondays.  Return to clinic in 4 weeks.

## 2010-08-02 NOTE — Consult Note (Signed)
Summary: [facility]  [facility]   Imported By: Marilynne Drivers 12/08/2009 14:49:49  _____________________________________________________________________  External Attachment:    Type:   Image     Comment:   External Document

## 2010-08-02 NOTE — Progress Notes (Signed)
  Phone Note Refill Request   Refills Requested: Medication #1:  MOBIC 15 MG  TABS 1 once daily Initial call taken by: Ami Bullins CMA,  February 14, 2010 1:28 PM    Prescriptions: MOBIC 15 MG  TABS (MELOXICAM) 1 once daily  #90 x 3   Entered by:   Ami Bullins CMA   Authorized by:   Neena Rhymes MD   Signed by:   Charlynne Cousins CMA on 02/14/2010   Method used:   Electronically to        [id number] (retail)       [address]       [place], [place]  [address]       Ph: [phone]       Fax: [phone]   RxID:   [id number]

## 2010-08-02 NOTE — Progress Notes (Signed)
  Phone Note Refill Request Message from:  Fax from Pharmacy on February 15, 2010 11:03 AM  Refills Requested: Medication #1:  OMEPRAZOLE 10 MG CPDR Take 1 capsule by mouth once a day Initial call taken by: Ami Bullins CMA,  February 15, 2010 11:03 AM    Prescriptions: OMEPRAZOLE 10 MG CPDR (OMEPRAZOLE) Take 1 capsule by mouth once a day  #30 x 6   Entered by:   Ami Bullins CMA   Authorized by:   Neena Rhymes MD   Signed by:   Charlynne Cousins CMA on 02/15/2010   Method used:   Electronically to        [id number] (retail)       [address]       [place], [place]  [address]       Ph: [phone]       Fax: [phone]   [place]:   [phone]

## 2010-08-02 NOTE — Progress Notes (Signed)
Summary: RESULTS  Phone Note Other Incoming   Caller: April from [facility] [place] [facility] Summary of Call: lab results: These two were high : PT 23.9 INR 3.03 Initial call taken by: [place],  October 27, 2009 2:55 PM  Follow-up for Phone Call        INR close to goal. Continue present dose coumadin. Repeat lab 3 weeks.  Follow-up by: Neena Rhymes MD,  October 27, 2009 3:31 PM  Additional Follow-up for Phone Call Additional follow up Details #1::        left message on machine to call back to office. Additional Follow-up by: Ernestene Mention,  October 27, 2009 3:52 PM    Additional Follow-up for Phone Call Additional follow up Details #2::    left mess to call office back....................Marland KitchenCharlsie Quest, CMA  October 28, 2009 3:52 PM   left mess to call office back, will mail letter today Follow-up by: Charlsie Quest, [place],  Nov 01, 2009 8:30 AM

## 2010-08-02 NOTE — Progress Notes (Signed)
  Phone Note Refill Request Message from:  Fax from Pharmacy on May 16, 2010 5:17 PM  Refills Requested: Medication #1:  COZAAR 50 MG  TABS 1 two times a day Initial call taken by: Ami Bullins CMA,  May 16, 2010 5:17 PM    Prescriptions: COZAAR 50 MG  TABS (LOSARTAN POTASSIUM) 1 two times a day  #30 x 4   Entered by:   Ami Bullins CMA   Authorized by:   Neena Rhymes MD   Signed by:   Charlynne Cousins CMA on 05/16/2010   Method used:   Electronically to        [id number] (retail)       [address]       [place], [place]  [address]       Ph: [phone]       Fax: [phone]   RxID:   [id number]

## 2010-08-02 NOTE — Letter (Signed)
Summary: Generic Letter  [facility]   [place], [place] [address]   Phone: [phone]  Fax: [phone]    11/01/2009  Tim [name] [address] [place], [place]  [address]  Dear [name],   Your recent INR was close to goal. Please continue your current dose of coumadin and repeat labs in 3 weeks. Feel free to contact our office with any questions.         Sincerely,   Charlsie Quest, CMA ([place]) for Dr Jerilynn Mages. [place]

## 2010-08-02 NOTE — Progress Notes (Signed)
Summary: RESULTS - INR  Phone Note Outgoing Call   Reason for Call: Discuss lab or test results Summary of Call: INR is slightly elevated at 3.2. Continue present meds. Recheck in 2 weeks. Initial call taken by: Neena Rhymes MD,  August 31, 2009 4:38 PM  Follow-up for Phone Call        left mess to call office back.......................Marland KitchenCharlsie Quest, CMA  August 31, 2009 5:47 PM   Additional Follow-up for Phone Call Additional follow up Details #1::        left message on machine to call back to office. Additional Follow-up by: Ernestene Mention,  September 01, 2009 10:46 AM    Additional Follow-up for Phone Call Additional follow up Details #2::    Pt informed  Follow-up by: Charlsie Quest, CMA,  September 02, 2009 5:34 PM

## 2010-08-02 NOTE — Assessment & Plan Note (Signed)
Summary: sob increased/seen by pulm recently/norins pt/SD   Vital Signs:  Patient profile:   70 year old male Height:      74 inches Weight:      394 pounds BMI:     50.77 O2 Sat:      97 % on Room air Temp:     96.5 degrees F oral Pulse rate:   86 / minute Pulse rhythm:   regular Resp:     16 per minute BP sitting:   118 / 84  (left arm) Cuff size:   large  Vitals Entered By: Charlynne Cousins CMA (September 08, 2009 9:44 AM)  Nutrition Counseling: Patient's BMI is greater than 25 and therefore counseled on weight management options.  O2 Flow:  Room air CC: pt here with complaint of SOB x 1 week/ ab   Primary Care Provider:  Neena Rhymes MD  CC:  pt here with complaint of SOB x 1 week/ ab.  History of Present Illness: New to me he complains of one week hx. of nasal congestion, sore throat, and gasping at night with an uncomfortable CPAP machine.  Asthma History    Initial Asthma Severity Rating:    Age range: 12+ years    Symptoms: >2 days/week; not daily    Nighttime Awakenings: 0-2/month    Interferes w/ normal activity: minor limitations    SABA use (not for EIB): >2 days/week but not >1X/day    Asthma Severity Assessment: Mild Persistent   Current Medications (verified): 1)  Advair Diskus 250-50 Mcg/dose  Misc (Fluticasone-Salmeterol) .[name].. 1 Puff Two Times A Day 2)  Lasix 40 Mg  Tabs (Furosemide) .[name].. 1 Two Times A Day 3)  Allopurinol 300 Mg  Tabs (Allopurinol) .[name].. 1 Once Daily 4)  Prilosec 20 Mg  Cpdr (Omeprazole) .[name].. 1 Once Daily 5)  Crestor 40 Mg Tabs (Rosuvastatin Calcium) .... Take 1 Tablet By Mouth Once A Day 6)  Metoprolol Succinate 200 Mg  Tb24 (Metoprolol Succinate) .[name].. 1 Two Times A Day 7)  Cozaar 50 Mg  Tabs (Losartan Potassium) .[name].. 1 Two Times A Day 8)  Spiriva Handihaler 18 Mcg  Caps (Tiotropium Bromide Monohydrate) .[name].. 1 Once Daily 9)  Niaspan 1000 Mg  Tbcr (Niacin (Antihyperlipidemic)) .... 2064m Daily 10)  Mobic 15 Mg  Tabs (Meloxicam) ..[name]. 1  Once Daily 11)  Oxycodone Hcl 10 Mg Tabs (Oxycodone Hcl) ..[name]. 1 By Mouth Q 6 As Needed 12)  Omega-3 350 Mg  Caps (Omega-3 Fatty Acids) .... Once Daily 13)  Gabapentin 600 Mg Tabs (Gabapentin) .... 2 Tablets Two Times A Day 14)  Finasteride 5 Mg Tabs (Finasteride) ..[name]. 1 By Mouth Once Daily 15)  Multivitamins   Tabs (Multiple Vitamin) .... Take 1 Tablet By Mouth Once A Day 16)  Vitamin E 600 Unit  Caps (Vitamin E) .... Take 1 Capsule By Mouth Once A Day 17)  Vitamin C 500 Mg  Tabs (Ascorbic Acid) .... Take 1 Tablet By Mouth Once A Day 18)  Albuterol Sulfate (2.5 Mg/313m 0.083% Nebu (Albuterol Sulfate) ...[name] 1 - 2 Puffs As Needed 19)  Cpap .... At Bedtime 20)  Zetia 10 Mg Tabs (Ezetimibe) .... Take One Tablet By Mouth Daily. 21)  Flonase 50 Mcg/act Susp (Fluticasone Propionate) .... As Needed  Allergies (verified): 1)  ! Vioxx 2)  ! Celebrex 3)  ! Codeine 4)  ! * Meclizine 5)  ! * Temoptic 6)  ! * Alphagan 7)  ! * Colchicine 8)  ! *  Xalatan 9)  ! * Azopt 10)  ! Vancomycin  Past History:  Past Medical History: Reviewed history from 01/13/2009 and no changes required. Current Problems:  CAD (ICD-414.00) Hypertension hyperlipidemia H/o PUD HEART MURMUR, HX OF (ICD-V12.50) ATRIAL FIBRILLATION (ICD-427.31) PULMONARY HYPERTENSION, HX OF (ICD-V12.59) COPD (ICD-496) VENOUS INSUFFICIENCY (ICD-459.81) VENTRAL HERNIA (ICD-553.20) SLEEP APNEA (ICD-780.57) BENIGN PROSTATIC HYPERTROPHY, HX OF (ICD-V13.8) PNEUMONIA (ICD-486) MONONUCLEOSIS (ICD-075) HIATAL HERNIA (ICD-553.3) GOUT (ICD-274.9) GLAUCOMA (ICD-365.9) DEGENERATIVE DISC DISEASE (ICD-722.6) CATARACT, HX OF (ICD-V12.40) BRONCHITIS (ICD-490) ARTHRITIS (ICD-716.90) CELLULITIS AND ABSCESS OF LEG EXCEPT FOOT (ICD-682.6) HALLUX VALGUS, 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  hemorrhoidectomy  vitrectomy, Length left and right calf muscle replace left eye lens implant  trimmed knee cartilage right knee,  implant lens left eye  traeculectomy left eye cellulitis in legs x 4 Tonsillectomy  Family History: Reviewed history from 05/21/2008 and no changes required. father-deceased @ 43: pulmonary fibrosis from asbetosis, Hypetriglyceridemia mother - deceased @ 24: CAD/MI, HTN Neg- colon or prostate cancer; DM  Social History: Reviewed history from 01/13/2009 and no changes required. [name] -BS, [employer] MS-engineering science married '67 - 10years/divorced; married '80 - 3 years, divorced; '87 no children - one adopted daughter (estranged) work: retired Animal nutritionist. Tobacco Use - No.  Alcohol Use - no Retired   Review of Systems       The patient complains of dyspnea on exertion.  The patient denies anorexia, fever, weight loss, vision loss, chest pain, syncope, peripheral edema, prolonged cough, headaches, hemoptysis, abdominal pain, and suspicious skin lesions.   CV:  Complains of difficulty breathing at night, difficulty breathing while lying down, and shortness of breath with exertion; denies bluish discoloration of lips or nails, chest pain or discomfort, fainting, leg cramps with exertion, lightheadness, near fainting, palpitations, swelling of feet, swelling of hands, and weight gain. Resp:  Complains of excessive snoring, hypersomnolence, and shortness of breath; denies chest discomfort, chest pain with inspiration, cough, coughing up blood, morning headaches, pleuritic, sputum productive, and wheezing.  Physical Exam  General:  alert, well-developed, well-nourished, well-hydrated, appropriate dress, cooperative to examination, good hygiene, and overweight-appearing.   Head:  normocephalic, atraumatic, no abnormalities  observed, and no abnormalities palpated.   Ears:  R ear normal and L ear normal.   Nose:  no airflow obstruction, no intranasal foreign body, no nasal polyps, no nasal mucosal lesions, no mucosal friability, no active bleeding or clots, no sinus percussion tenderness, no septum abnormalities, nasal dischargemucosal pallor, and mucosal edema.   Neck:  supple, full ROM, no masses, no thyromegaly, no JVD, and normal carotid upstroke.   Lungs:  normal respiratory effort, no intercostal retractions, no accessory muscle use, normal breath sounds, no dullness, no fremitus, no crackles, and no wheezes.   Heart:  normal rate and regular rhythm.  Grade  1/6 systolic ejection murmur.  no gallop, no rub, no JVD. Abdomen:  soft, non-tender, normal bowel sounds, no distention, no masses, no guarding, no rigidity, no rebound tenderness, no abdominal hernia, no inguinal hernia, no hepatomegaly, and no splenomegaly.   Msk:  normal ROM, no joint tenderness, no joint swelling, and no joint warmth.   Pulses:  R radial decreased, R femoral decreased, R popliteal decreased, R posterior tibial decreased, R dorsalis pedis decreased, L radial decreased, L femoral decreased, L popliteal decreased, L posterior tibial decreased, and  L dorsalis pedis decreased.   Extremities:  1+ left pedal edema and 1+ right pedal edema.   Neurologic:  No cranial nerve deficits noted. Station and gait are normal. Plantar reflexes are down-going bilaterally. DTRs are symmetrical throughout. Sensory, motor and coordinative functions appear intact. Skin:  turgor normal, color normal, no rashes, no suspicious lesions, no ecchymoses, no petechiae, no purpura, no ulcerations, and no edema.   Cervical Nodes:  no anterior cervical adenopathy and no posterior cervical adenopathy.   Axillary Nodes:  no R axillary adenopathy and no L axillary adenopathy.   Psych:  Cognition and judgment appear intact. Alert and cooperative with normal attention span and  concentration. No apparent delusions, illusions, hallucinations Additional Exam:  EKG shows NSR with RBBB that is unchanged from prior EKG's   Impression & Recommendations:  Problem # 1:  DYSPNEA/SHORTNESS OF BREATH (ICD-786.09) Assessment New he has an unchanged EKG today and a previous cath. that was negative for CAD so I don't think this is angina. his nasal mucosa looks allergic so will try systemic steroids. also, will look at Chest xray for pna, edema, mass, etc. i doubt this is pulmonary emboli with no pain or hemoptysis and he's on coumadin. His updated medication list for this problem includes:    Advair Diskus 250-50 Mcg/dose Misc (Fluticasone-salmeterol) .[name]... 1 puff two times a day    Lasix 40 Mg Tabs (Furosemide) .[name]... 1 two times a day    Metoprolol Succinate 200 Mg Tb24 (Metoprolol succinate) .[name]... 1 two times a day    Spiriva Handihaler 18 Mcg Caps (Tiotropium bromide monohydrate) .[name]... 1 once daily    Albuterol Sulfate (2.5 Mg/73m) 0.083% Nebu (Albuterol sulfate) ..[name].. 1 - 2 puffs as needed  Orders: T-2 View CXR (761950DT EKG w/ Interpretation (93000) Admin of Therapeutic Inj  intramuscular or subcutaneous ((26712 Depo- Medrol 456m(J1030) Depo- Medrol [id number] # 2:  ESSENTIAL HYPERTENSION, BENIGN (ICD-401.1) Assessment: Improved  His updated medication list for this problem includes:    Lasix 40 Mg Tabs (Furosemide) ....[name] 1 two times a day    Metoprolol Succinate 200 Mg Tb24 (Metoprolol succinate) ....[name] 1 two times a day    Cozaar 50 Mg Tabs (Losartan potassium) ....[name] 1 two times a day  BP today: 118/84 Prior BP: 140/82 (01/13/2009)  Labs Reviewed: Chol: 112 (03/30/2009)   HDL: 61 (03/30/2009)   LDL: 30 (03/30/2009)   TG: 103 (03/30/2009)  Problem # 3:  ASTHMA (ICD-493.90) Assessment: Unchanged  His updated medication list for this problem includes:    Advair Diskus 250-50 Mcg/dose Misc (Fluticasone-salmeterol) ....[name] 1 puff two times a day     Spiriva Handihaler 18 Mcg Caps (Tiotropium bromide monohydrate) ....[name] 1 once daily    Albuterol Sulfate (2.5 Mg/3ml22m.083% Nebu (Albuterol sulfate) .....[name]1 - 2 puffs as needed  Pulmonary Functions Reviewed: O2 sat: 97 (09/08/2009)  Problem # 4:  HEART MURMUR, HX OF (ICD-V12.50) Assessment: Unchanged i [facility] that shows [facility] of MR  Complete Medication List: 1)  Advair Diskus 250-50 Mcg/dose Misc (Fluticasone-salmeterol) .... [name] puff two times a day 2)  Lasix 40 Mg Tabs (Furosemide) .... [name] two times a day 3)  Allopurinol 300 Mg Tabs (Allopurinol) .... [name] once daily 4)  Prilosec 20 Mg Cpdr (Omeprazole) .... [name] once daily 5)  Crestor 40 Mg Tabs (Rosuvastatin calcium) .... Take 1 tablet by mouth once a day 6)  Metoprolol Succinate 200 Mg Tb24 (Metoprolol succinate) .... Marland KitchenMarland KitchenMarland Kitchen  two times a day 7)  Cozaar 50 Mg Tabs (Losartan potassium) .[name].. 1 two times a day 8)  Spiriva Handihaler 18 Mcg Caps (Tiotropium bromide monohydrate) .[name].. 1 once daily 9)  Niaspan 1000 Mg Tbcr (Niacin (antihyperlipidemic)) .... 2071m daily 10)  Mobic 15 Mg Tabs (Meloxicam) ..[name]. 1 once daily 11)  Oxycodone Hcl 10 Mg Tabs (Oxycodone hcl) ..[name]. 1 by mouth q 6 as needed 12)  Omega-3 350 Mg Caps (Omega-3 fatty acids) .... Once daily 13)  Gabapentin 600 Mg Tabs (Gabapentin) .... 2 tablets two times a day 14)  Finasteride 5 Mg Tabs (Finasteride) ..[name]. 1 by mouth once daily 15)  Multivitamins Tabs (Multiple vitamin) .... Take 1 tablet by mouth once a day 16)  Vitamin E 600 Unit Caps (Vitamin e) .... Take 1 capsule by mouth once a day 17)  Vitamin C 500 Mg Tabs (Ascorbic acid) .... Take 1 tablet by mouth once a day 18)  Albuterol Sulfate (2.5 Mg/348m 0.083% Nebu (Albuterol sulfate) ...[name] 1 - 2 puffs as needed 19)  Cpap  .... At bedtime 20)  Zetia 10 Mg Tabs (Ezetimibe) .... Take one tablet by mouth daily. 21)  Flonase 50 Mcg/act Susp (Fluticasone propionate) .... As needed  Other Orders: Tdap =>  7y100yrM (90(35361dmin 1st Vaccine (90(44315Patient Instructions: 1)  Please schedule a follow-up appointment in 2 weeks. 2)  You need to lose weight. Consider a lower calorie diet and regular exercise.  3)  Get plenty of rest, drink lots of clear liquids, and use Tylenol or Ibuprofen for fever and comfort. Return in 7-10 days if you're not better:sooner if you're feeling worse.   Preventive Care Screening  Last Flu Shot:    Date:  05/21/2009    Results:  given     Medication Administration  Injection # 1:    Medication: Depo- Medrol 71m57m Diagnosis: DYSPNEA/SHORTNESS OF BREATH (ICD-786.09)    Route: IM    Site: R deltoid    Exp Date: 09.2013    Lot #: [id number]    Mfr: Pharmacia    Patient tolerated injection without complications    Given by: SherFelipa Evenerrch  9, 2011 10:50 AM)  Injection # 2:    Medication: Depo- Medrol 40mg35mDiagnosis: DYSPNEA/SHORTNESS OF BREATH (ICD-786.09)    Route: IM    Site: R deltoid    Exp Date: 09.2013    Lot #: [id number]    Mfr: Pharmacia    Patient tolerated injection without complications    Given by: ShereFelipa Evenerch  9, 2011 10:51 AM)  Orders Added: 1)  T-2 View CXR [71020TC] 2)  EKG w/ Interpretation [93000] 3)  Admin of Therapeutic Inj  intramuscular or subcutaneous [96372] 4)  Depo- Medrol 40mg 38m30] 5)  Depo- Medrol 71mg [42m0] 6)  Tdap => 57yrs IM80yr715] [40086]in 1st Vaccine [90471] 8)  Est. Patient Level V [99215] [76195]nizations Administered:  Tetanus Vaccine:    Vaccine Type: Tdap    Site: left deltoid    Mfr: GlaxoSmithKline    Dose: 0.5 ml    Route: IM    Given by: Sherese Felipa Evener Date: 09/25/2011    Lot #: [id number]: 05/21/07 version given September 08, 2009.

## 2010-08-02 NOTE — Medication Information (Signed)
Summary: rov/sl  Anticoagulant Therapy  Managed by: Porfirio Oar, PharmD Referring MD: Stanford Breed MD, Aaron Edelman PCP: Neena Rhymes MD Supervising MD: Haroldine Laws MD, Quillian Quince Indication 1: Atrial Fibrillation Lab Used: [facility] [place] Site: [place] INR POC 2.5 INR RANGE 2.0-3.0  Dietary changes: no    Health status changes: no    Bleeding/hemorrhagic complications: no    Recent/future hospitalizations: no    Any changes in medication regimen? no    Recent/future dental: no  Any missed doses?: no       Is patient compliant with meds? yes       Allergies: 1)  ! Vioxx 2)  ! Celebrex 3)  ! Codeine 4)  ! * Meclizine 5)  ! * Temoptic 6)  ! * Alphagan 7)  ! * Colchicine 8)  ! * Xalatan 9)  ! * Azopt 10)  ! Vancomycin  Anticoagulation Management History:      The patient is taking warfarin and comes in today for a routine follow up visit.  Positive risk factors for bleeding include an age of 71 years or older.  The bleeding index is 'intermediate risk'.  Positive CHADS2 values include History of HTN.  Negative CHADS2 values include Age > 71 years old.  His last INR was 3.11.  Anticoagulation responsible provider: Bensimhon MD, Quillian Quince.  INR POC: 2.5.  Cuvette Lot#: [id number].  Exp: 06/2011.    Anticoagulation Management Assessment/Plan:      The patient's current anticoagulation dose is Coumadin 5 mg tabs: take as directed.  The target INR is 2.0-3.0.  The next INR is due 06/22/2010.  Anticoagulation instructions were given to patient.  Results were reviewed/authorized by Porfirio Oar, PharmD.  He was notified by Porfirio Oar PharmD.         Prior Anticoagulation Instructions: INR 3.1  Today, Wednesday, November 9th, do not take Coumadin. Then, resume taking Coumadin 0.5 tab (2.5 mg) on all days except Coumadin 1 tab (5 mg) on Mondays. Return to clinic in 3 weeks.   Current Anticoagulation Instructions: INR 2.5  Continue same dose of 1/2 tablet every day  except 1 tablet on Monday.  Recheck INR in 3 weeks.

## 2010-08-02 NOTE — Medication Information (Signed)
Summary: rov/sl  Anticoagulant Therapy  Managed by: Margaretha Sheffield, PharmD Referring MD: Stanford Breed MD, Aaron Edelman PCP: Neena Rhymes MD Supervising MD: Haroldine Laws MD, Quillian Quince Indication 1: Atrial Fibrillation Lab Used: [facility] [place] Site: [place] INR POC 1.2 INR RANGE 2.0-3.0  Dietary changes: no    Health status changes: no    Bleeding/hemorrhagic complications: no    Recent/future hospitalizations: no    Any changes in medication regimen? no    Recent/future dental: no  Any missed doses?: yes     Details: Pt has held for prev 5 days for epidural injection  Is patient compliant with meds? yes      Comments: Patient has an epidural scheduled for today and is in to ensure his INR is down to 1.2  or less.    Allergies: 1)  ! Vioxx 2)  ! Celebrex 3)  ! Codeine 4)  ! * Meclizine 5)  ! * Temoptic 6)  ! * Alphagan 7)  ! * Colchicine 8)  ! * Xalatan 9)  ! * Azopt 10)  ! Vancomycin  Anticoagulation Management History:      The patient is taking warfarin and comes in today for a routine follow up visit.  Positive risk factors for bleeding include an age of 56 years or older.  The bleeding index is 'intermediate risk'.  Positive CHADS2 values include History of HTN.  Negative CHADS2 values include Age > 74 years old.  His last INR was 3.11.  Anticoagulation responsible provider: Alesi Zachery MD, Quillian Quince.  INR POC: 1.2.  Cuvette Lot#: [id number].  Exp: 05/2011.    Anticoagulation Management Assessment/Plan:      The patient's current anticoagulation dose is Coumadin 5 mg tabs: take as directed.  The target INR is 2.0-3.0.  The next INR is due 04/28/2010.  Anticoagulation instructions were given to patient.  Results were reviewed/authorized by Margaretha Sheffield, PharmD.  He was notified by Margaretha Sheffield.         Prior Anticoagulation Instructions: INR 2.7  Continue Coumadin as scheduled: 1/2 tablet every day of the week except 1 tablet on Monday.  Take last  dose of Coumadin on 04/15/2010.  Return to Clinic on 04/21/2010.   Current Anticoagulation Instructions: INR 1.2  After injection, take 1.5 tablets on day one, then take 1 tablet on day 2, then return to normal dosing schedule of 1/2 tablet all days, except 1 tablet on Mondays.  Return to clinic in 1 week.

## 2010-08-02 NOTE — Letter (Signed)
Summary: [facility]  [facility]   Imported By: Phillis Knack 09/20/2009 08:14:12  _____________________________________________________________________  External Attachment:    Type:   Image     Comment:   External Document

## 2010-08-02 NOTE — Medication Information (Signed)
Summary: rov/sp  Anticoagulant Therapy  Managed by: Mammie Lorenzo, PharmD Referring MD: Stanford Breed MD, Aaron Edelman PCP: Neena Rhymes MD Supervising MD: Angelena Form MD, Harrell Gave Indication 1: Atrial Fibrillation Lab Used: [facility] Site: [place] INR POC 1.4 INR RANGE 2.0-3.0  Dietary changes: no    Health status changes: yes       Details: having spinal epidural injection tomorrow  Bleeding/hemorrhagic complications: no    Recent/future hospitalizations: no    Any changes in medication regimen? yes       Details: prescribed new pain med, doesnt know what. stopped coumadin 5 days ago.  Recent/future dental: no  Any missed doses?: no       Is patient compliant with meds? yes       Allergies: 1)  ! Vioxx 2)  ! Celebrex 3)  ! Codeine 4)  ! * Meclizine 5)  ! * Temoptic 6)  ! * Alphagan 7)  ! * Colchicine 8)  ! * Xalatan 9)  ! * Azopt 10)  ! Vancomycin  Anticoagulation Management History:      Positive risk factors for bleeding include an age of 39 years or older.  The bleeding index is 'intermediate risk'.  Positive CHADS2 values include History of HTN.  Negative CHADS2 values include Age > 16 years old.  His last INR was 3.11.  Anticoagulation responsible provider: Angelena Form MD, Harrell Gave.  INR POC: 1.4.  Exp: 04/2011.    Anticoagulation Management Assessment/Plan:      The patient's current anticoagulation dose is Coumadin 5 mg tabs: take as directed.  The target INR is 2.0-3.0.  The next INR is due 03/23/2010.  Anticoagulation instructions were given to patient.  Results were reviewed/authorized by Mammie Lorenzo, PharmD.         Prior Anticoagulation Instructions: INR 2.7  Continue 1/2 tablet daily except for 1 tablet on Mondays.  Return to clinic in 4 weeks.  Current Anticoagulation Instructions: INR 1.4  AFter your injection. Start back on regular dose when your surgeon tells you to go back on. We will see you back next week to see if  you're back in range.

## 2010-08-02 NOTE — Medication Information (Signed)
Summary: rov/sp  Anticoagulant Therapy  Managed by: Porfirio Oar, PharmD Referring MD: Stanford Breed MD, Aaron Edelman PCP: Neena Rhymes MD Supervising MD: Ron Parker MD, Dellis Filbert Indication 1: Atrial Fibrillation Lab Used: [facility] [place] Site: [place] INR POC 1.0 INR RANGE 2.0-3.0  Dietary changes: no    Health status changes: no    Bleeding/hemorrhagic complications: no    Recent/future hospitalizations: no    Any changes in medication regimen? no    Recent/future dental: no  Any missed doses?: yes     Details: Is having spinal epidural today, has been off of Coumadin x 1 week.    Comments: INR of 1.4 at last visit was not low enough to do injection. Patient returned today to see if INR was < 1.2 and will go to have injection today.   Allergies: 1)  ! Vioxx 2)  ! Celebrex 3)  ! Codeine 4)  ! * Meclizine 5)  ! * Temoptic 6)  ! * Alphagan 7)  ! * Colchicine 8)  ! * Xalatan 9)  ! * Azopt 10)  ! Vancomycin  Anticoagulation Management History:      The patient is taking warfarin and comes in today for a routine follow up visit.  Positive risk factors for bleeding include an age of 71 years or older.  The bleeding index is 'intermediate risk'.  Positive CHADS2 values include History of HTN.  Negative CHADS2 values include Age > 59 years old.  His last INR was 3.11.  Anticoagulation responsible provider: Ron Parker MD, Dellis Filbert.  INR POC: 1.0.  Cuvette Lot#: [id number].  Exp: 04/2011.    Anticoagulation Management Assessment/Plan:      The patient's current anticoagulation dose is Coumadin 5 mg tabs: take as directed.  The target INR is 2.0-3.0.  The next INR is due 03/23/2010.  Anticoagulation instructions were given to patient.  Results were reviewed/authorized by Porfirio Oar, PharmD.  He was notified by Sharol Harness, PharmD candidate.         Prior Anticoagulation Instructions: INR 1.4  AFter your injection. Start back on regular dose when your surgeon tells you to  go back on. We will see you back next week to see if you're back in range.  Current Anticoagulation Instructions: INR 1.0  After your injection, take 1 1/2 tablets on the first day that your surgeon tells you can start the Coumadin. Then take 1 tablet on the 2nd day. After that, resume your regular schedule of 1/2 tablet everyday except for 1 tablet on Mondays. Re-check INR next week.

## 2010-08-02 NOTE — Progress Notes (Signed)
Summary: Tim [name] pt - INR results  Phone Note Call from Patient   Summary of Call: Pt called b/c he went to [facility], in [place] for INR redraw as usual, but we had not called him back w/results. Per pt, he went to lab 6/9 and 6/27. No results in EMR so I called [facility]. They faxed results and are working on the problem regarding the cross over of info into the chart.  6/9 PT 22.3 INR 1.99  6/27 PT 31.4 INR 3.11  Patient currently takes coumadin 8m on monday and friday and 2.535mon all other days. Does he need to have another INR soon b/c last one was 10 days ago? Patient also would like referral to coumadin clinic. Initial call taken by: SaCharlsie QuestCMPastos January 05, 2010 10:28 AM  Follow-up for Phone Call        labs reviewed - very slightly elevated but i do not rec any dose change for now - referral to coumadin clinic done - should have appt there this week for further mgmt and they will check the repeat INR for [place] thanks Follow-up by: VaRowe ClackD,  January 05, 2010 11:11 AM  Additional Follow-up for Phone Call Additional follow up Details #1::        Pt informed, Referral is in process Additional Follow-up by: SaCharlsie QuestCMA,  January 05, 2010 12:27 PM

## 2010-08-04 ENCOUNTER — Ambulatory Visit: Admit: 2010-08-04 | Payer: Self-pay

## 2010-08-04 ENCOUNTER — Ambulatory Visit: Payer: MEDICARE

## 2010-08-04 ENCOUNTER — Ambulatory Visit: Admit: 2010-08-04 | Payer: Self-pay | Admitting: Physical Medicine & Rehabilitation

## 2010-08-04 ENCOUNTER — Encounter: Payer: MEDICARE | Admitting: Physical Medicine & Rehabilitation

## 2010-08-04 ENCOUNTER — Encounter: Payer: Self-pay | Admitting: Cardiology

## 2010-08-04 ENCOUNTER — Encounter (INDEPENDENT_AMBULATORY_CARE_PROVIDER_SITE_OTHER): Payer: MEDICARE

## 2010-08-04 DIAGNOSIS — Z7901 Long term (current) use of anticoagulants: Secondary | ICD-10-CM

## 2010-08-04 DIAGNOSIS — I4891 Unspecified atrial fibrillation: Secondary | ICD-10-CM

## 2010-08-04 DIAGNOSIS — IMO0002 Reserved for concepts with insufficient information to code with codable children: Secondary | ICD-10-CM

## 2010-08-04 DIAGNOSIS — M533 Sacrococcygeal disorders, not elsewhere classified: Secondary | ICD-10-CM | POA: Insufficient documentation

## 2010-08-04 LAB — CONVERTED CEMR LAB: POC INR: 1.2

## 2010-08-04 NOTE — Medication Information (Signed)
Summary: rov coumadin - [facility]  Anticoagulant Therapy  Managed by: Bonnita Nasuti, PharmD, BCPS, CPP Referring MD: Stanford Breed MD, Aaron Edelman PCP: Neena Rhymes MD Supervising MD: Caryl Comes MD, Remo Lipps Indication 1: Atrial Fibrillation Lab Used: [facility] [place] Site: [place] INR POC 3.3 INR RANGE 2.0-3.0  Dietary changes: no    Health status changes: no    Bleeding/hemorrhagic complications: no    Recent/future hospitalizations: yes       Details: spinal inj  Any changes in medication regimen? no    Recent/future dental: no  Any missed doses?: no       Is patient compliant with meds? yes      Comments: plan to stop Coumadin for spinal inj 2/2 - hold starts 5day prior  Current Medications (verified): 1)  Advair Diskus 250-50 Mcg/dose  Misc (Fluticasone-Salmeterol) .[name].. 1 Puff Two Times A Day 2)  Allopurinol 300 Mg  Tabs (Allopurinol) .[name].. 1 Once Daily 3)  Omeprazole 10 Mg Cpdr (Omeprazole) .... Take 1 Capsule By Mouth Once A Day 4)  Crestor 40 Mg Tabs (Rosuvastatin Calcium) .... Take 1 Tablet By Mouth Once A Day 5)  Metoprolol Succinate 200 Mg  Tb24 (Metoprolol Succinate) .[name].. 1 Two Times A Day 6)  Cozaar 50 Mg  Tabs (Losartan Potassium) .[name].. 1 Two Times A Day 7)  Spiriva Handihaler 18 Mcg  Caps (Tiotropium Bromide Monohydrate) .[name].. 1 Once Daily 8)  Niaspan 1000 Mg  Tbcr (Niacin (Antihyperlipidemic)) .... 2059m Daily 9)  Mobic 15 Mg  Tabs (Meloxicam) ..[name]. 1 Once Daily 10)  Oxycodone Hcl 10 Mg Tabs (Oxycodone Hcl) ..[name]. 1 By Mouth Q 6 As Needed 11)  Omega-3 350 Mg  Caps (Omega-3 Fatty Acids) .... Once Daily 12)  Gabapentin 100 Mg Caps (Gabapentin) .... Take 5 Two Times A Day 13)  Finasteride 5 Mg Tabs (Finasteride) ..[name]. 1 By Mouth Once Daily 14)  Multivitamins   Tabs (Multiple Vitamin) .... Take 1 Tablet By Mouth Once A Day 15)  Vitamin E 600 Unit  Caps (Vitamin E) .... Take 1 Capsule By Mouth Once A Day 16)  Vitamin C 500 Mg  Tabs (Ascorbic Acid) .... Take 1  Tablet By Mouth Once A Day 17)  Albuterol Sulfate (2.5 Mg/322m 0.083% Nebu (Albuterol Sulfate) ...[name] 1 - 2 Puffs As Needed 18)  Cpap .... At Bedtime 19)  Zetia 10 Mg Tabs (Ezetimibe) .... Take One Tablet By Mouth Daily. 20)  Flonase 50 Mcg/act Susp (Fluticasone Propionate) .... As Needed 21)  Furosemide 40 Mg Tabs (Furosemide) .... Take 1 Tablet By Mouth Two Times A Day 22)  Coumadin 5 Mg Tabs (Warfarin Sodium) .... Take As Directed 23)  Tramadol-Acetaminophen 37.5-325 Mg Tabs (Tramadol-Acetaminophen) .... Take 1 Tablet By Mouth Three Times A Day  Allergies: 1)  ! Vioxx 2)  ! Celebrex 3)  ! Codeine 4)  ! * Temoptic 5)  ! * Alphagan 6)  ! * Colchicine 7)  ! * Xalatan 8)  ! * Azopt 9)  ! Vancomycin 10)  ! * Diltiazem  Anticoagulation Management History:      The patient is taking warfarin and comes in today for a routine follow up visit.  Positive risk factors for bleeding include an age of [age]ears or older.  The bleeding index is 'intermediate risk'.  Positive CHADS2 values include History of HTN.  Negative CHADS2 values include Age > [age]ears old.  His last INR was 3.11.  Anticoagulation responsible provider: KlCaryl ComesD, StRemo Lipps  INR POC: 3.3.  Exp: 06/2011.    Anticoagulation Management Assessment/Plan:      The patient's current anticoagulation dose is Coumadin 5 mg tabs: take as directed.  The target INR is 2.0-3.0.  The next INR is due 08/04/2010.  Anticoagulation instructions were given to patient.  Results were reviewed/authorized by Bonnita Nasuti, PharmD, BCPS, CPP.         Prior Anticoagulation Instructions: INR 2.2  Coumadin 26m tabs - take 1/2 tab each day EXCEPT 1 tab on MON  Current Anticoagulation Instructions: INR 3.3  NO COUMADIN TODAY WED 1/18 Coumadin 536mtabs Take 1 tab on MON and 1/2 tab all other days Stop Coumadin 5 days prior to spinal injection

## 2010-08-04 NOTE — Progress Notes (Signed)
Summary: Patient call back informed about labs.  Phone Note Outgoing Call   Reason for Call: Discuss lab or test results Summary of Call: please call patient - A1C 5.8% - NORMAL Thanks Initial call taken by: Neena Rhymes MD,  July 06, 2010 8:43 AM  Follow-up for Phone Call        lmoam for pt to call back Follow-up by: Ami Bullins CMA,  July 06, 2010 8:45 AM  Additional Follow-up for Phone Call Additional follow up Details #1::        lmoam for pt to call back Additional Follow-up by: [employer],  July 08, 2010 8:43 AM    Additional Follow-up for Phone Call Additional follow up Details #2::    Patient returned call and was informed that his A1C is normal per MD. Follow-up by: Fleet Contras,  July 08, 2010 9:24 AM

## 2010-08-04 NOTE — Assessment & Plan Note (Signed)
Summary: PER PT OV--STC   Vital Signs:  Patient profile:   70 year old male Height:      74 inches Weight:      357 pounds BMI:     46.00 O2 Sat:      97 % on Room air Temp:     98.5 degrees F oral Pulse rate:   66 / minute BP sitting:   134 / 68  (left arm) Cuff size:   large  Vitals Entered By: Ami Bullins CMA (July 05, 2010 3:40 PM)  O2 Flow:  Room air CC: ov to discuss labs/ab   Primary Care Provider:  Neena Rhymes MD  CC:  ov to discuss labs/ab.  History of Present Illness: Tim [name] presents to discuss recent lab with elevated serum glucose at 136.  In the interval since his last visit he has lost 23 lbs through reduced calorie intake! He has also returned to the coumadin clinic for monitoring his INR>   He overall feels he has been doing well.   Current Medications (verified): 1)  Advair Diskus 250-50 Mcg/dose  Misc (Fluticasone-Salmeterol) .[name].. 1 Puff Two Times A Day 2)  Allopurinol 300 Mg  Tabs (Allopurinol) .[name].. 1 Once Daily 3)  Omeprazole 10 Mg Cpdr (Omeprazole) .... Take 1 Capsule By Mouth Once A Day 4)  Crestor 40 Mg Tabs (Rosuvastatin Calcium) .... Take 1 Tablet By Mouth Once A Day 5)  Metoprolol Succinate 200 Mg  Tb24 (Metoprolol Succinate) .[name].. 1 Two Times A Day 6)  Cozaar 50 Mg  Tabs (Losartan Potassium) .[name].. 1 Two Times A Day 7)  Spiriva Handihaler 18 Mcg  Caps (Tiotropium Bromide Monohydrate) .[name].. 1 Once Daily 8)  Niaspan 1000 Mg  Tbcr (Niacin (Antihyperlipidemic)) .... 2050m Daily 9)  Mobic 15 Mg  Tabs (Meloxicam) ..[name]. 1 Once Daily 10)  Oxycodone Hcl 10 Mg Tabs (Oxycodone Hcl) ..[name]. 1 By Mouth Q 6 As Needed 11)  Omega-3 350 Mg  Caps (Omega-3 Fatty Acids) .... Once Daily 12)  Gabapentin 100 Mg Caps (Gabapentin) .... Take 5 Two Times A Day 13)  Finasteride 5 Mg Tabs (Finasteride) ..[name]. 1 By Mouth Once Daily 14)  Multivitamins   Tabs (Multiple Vitamin) .... Take 1 Tablet By Mouth Once A Day 15)  Vitamin E 600 Unit  Caps (Vitamin E) .... Take 1  Capsule By Mouth Once A Day 16)  Vitamin C 500 Mg  Tabs (Ascorbic Acid) .... Take 1 Tablet By Mouth Once A Day 17)  Albuterol Sulfate (2.5 Mg/325m 0.083% Nebu (Albuterol Sulfate) ...[name] 1 - 2 Puffs As Needed 18)  Cpap .... At Bedtime 19)  Zetia 10 Mg Tabs (Ezetimibe) .... Take One Tablet By Mouth Daily. 20)  Flonase 50 Mcg/act Susp (Fluticasone Propionate) .... As Needed 21)  Furosemide 40 Mg Tabs (Furosemide) .... Take 1 Tablet By Mouth Two Times A Day 22)  Coumadin 5 Mg Tabs (Warfarin Sodium) .... Take As Directed 23)  Tramadol-Acetaminophen 37.5-325 Mg Tabs (Tramadol-Acetaminophen) .... Take 1 Tablet By Mouth Three Times A Day  Allergies (verified): 1)  ! Vioxx 2)  ! Celebrex 3)  ! Codeine 4)  ! * Temoptic 5)  ! * Alphagan 6)  ! * Colchicine 7)  ! * Xalatan 8)  ! * Azopt 9)  ! Vancomycin 10)  ! 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 Has had 2 private lessons from the [employer] on [employer]. He is currently playing in a band on a regular basis.  Tobacco Use - No.  Alcohol Use - no Retired   Review of Systems  The patient denies anorexia, fever, weight loss, weight gain, chest pain, syncope, peripheral edema, headaches, abdominal pain, incontinence, muscle weakness, depression, and angioedema.    Physical Exam  General:  overweight white male in no distress Head:  normocephalic and no abnormalities observed.   Eyes:  pupils equal and pupils round.   Lungs:  normal respiratory effort and normal breath sounds.   Heart:  normal rate and regular rhythm.   Abdomen:  soft, non-tender, and normal bowel sounds.   Neurologic:  alert & oriented X3, cranial nerves II-XII intact, and gait normal.   Skin:  turgor normal and color normal.   Psych:  Oriented X3, memory intact for recent and remote, good eye contact, and not anxious appearing.     Impression & Recommendations:  Problem # 1:  OTHER ABNORMAL BLOOD CHEMISTRY (ICD-790.6) Patient with srum glucose fsting of 136. He is concerned for diabetes.  Plan - A1C  Orders: TLB-A1C / Hgb A1C (Glycohemoglobin) (83036-A1C)  Addendum - A1C 5.8%  patiet to be called.   Complete Medication List: 1)  Advair Diskus 250-50 Mcg/dose Misc (Fluticasone-salmeterol) .[name].. 1 puff two times a day 2)  Allopurinol 300 Mg Tabs (Allopurinol) .[name].. 1 once daily 3)  Omeprazole 10 Mg Cpdr (Omeprazole) .... Take 1 capsule by mouth once a day 4)  Crestor 40 Mg Tabs (Rosuvastatin calcium) .... Take 1 tablet by mouth once a day 5)  Metoprolol Succinate 200 Mg Tb24 (Metoprolol succinate) .[name].. 1 two times a day 6)  Cozaar 50 Mg Tabs (Losartan potassium) .[name].. 1 two times a  day 7)  Spiriva Handihaler 18 Mcg Caps (Tiotropium bromide monohydrate) .[name].. 1 once daily 8)  Niaspan 1000 Mg Tbcr (Niacin (antihyperlipidemic)) .... 2077m daily 9)  Mobic 15 Mg Tabs (Meloxicam) ..[name]. 1 once daily 10)  Oxycodone Hcl 10 Mg Tabs (Oxycodone hcl) ..[name]. 1 by mouth q 6 as needed 11)  Omega-3 350 Mg Caps (Omega-3 fatty acids) .... Once daily 12)  Gabapentin 100 Mg Caps (Gabapentin) .... Take 5 two times a day 13)  Finasteride 5 Mg Tabs (Finasteride) ..[name]. 1 by mouth once daily 14)  Multivitamins Tabs (Multiple vitamin) .... Take 1 tablet by mouth once a day 15)  Vitamin E 600 Unit Caps (Vitamin e) .... Take 1 capsule by mouth once a day 16)  Vitamin C 500 Mg Tabs (Ascorbic acid) .... Take 1 tablet by mouth once a day 17)  Albuterol Sulfate (  2.5 Mg/12m) 0.083% Nebu (Albuterol sulfate) ..[name]. 1 - 2 puffs as needed 18)  Cpap  .... At bedtime 19)  Zetia 10 Mg Tabs (Ezetimibe) .... Take one tablet by mouth daily. 20)  Flonase 50 Mcg/act Susp (Fluticasone propionate) .... As needed 21)  Furosemide 40 Mg Tabs (Furosemide) .... Take 1 tablet by mouth two times a day 22)  Coumadin 5 Mg Tabs (Warfarin sodium) .... Take as directed 23)  Tramadol-acetaminophen 37.5-325 Mg Tabs (Tramadol-acetaminophen) .... Take 1 tablet by mouth three times a day   Orders Added: 1)  TLB-A1C / Hgb A1C (Glycohemoglobin) [83036-A1C] 2)  Est. Patient Level II [[84132]

## 2010-08-04 NOTE — Letter (Signed)
Summary: [facility]  [name]   Imported By: Phillis Knack 07/19/2010 10:11:52  _____________________________________________________________________  External Attachment:    Type:   Image     Comment:   External Document

## 2010-08-04 NOTE — Medication Information (Signed)
Summary: rov/sp  Anticoagulant Therapy  Managed by: Bonnita Nasuti, PharmD, BCPS, CPP Referring MD: Stanford Breed MD, Aaron Edelman PCP: Neena Rhymes MD Supervising MD: Aundra Dubin MD, Dalton Indication 1: Atrial Fibrillation Lab Used: [facility] [name] Site: [place] INR POC 2.2 INR RANGE 2.0-3.0  Dietary changes: no    Health status changes: no    Bleeding/hemorrhagic complications: no    Recent/future hospitalizations: no    Any changes in medication regimen? no    Recent/future dental: no  Any missed doses?: no       Is patient compliant with meds? yes       Current Medications (verified): 1)  Advair Diskus 250-50 Mcg/dose  Misc (Fluticasone-Salmeterol) .[name].. 1 Puff Two Times A Day 2)  Allopurinol 300 Mg  Tabs (Allopurinol) .[name].. 1 Once Daily 3)  Omeprazole 10 Mg Cpdr (Omeprazole) .... Take 1 Capsule By Mouth Once A Day 4)  Crestor 40 Mg Tabs (Rosuvastatin Calcium) .... Take 1 Tablet By Mouth Once A Day 5)  Metoprolol Succinate 200 Mg  Tb24 (Metoprolol Succinate) .[name].. 1 Two Times A Day 6)  Cozaar 50 Mg  Tabs (Losartan Potassium) .[name].. 1 Two Times A Day 7)  Spiriva Handihaler 18 Mcg  Caps (Tiotropium Bromide Monohydrate) .[name].. 1 Once Daily 8)  Niaspan 1000 Mg  Tbcr (Niacin (Antihyperlipidemic)) .... 2091m Daily 9)  Mobic 15 Mg  Tabs (Meloxicam) ..[name]. 1 Once Daily 10)  Oxycodone Hcl 10 Mg Tabs (Oxycodone Hcl) ..[name]. 1 By Mouth Q 6 As Needed 11)  Omega-3 350 Mg  Caps (Omega-3 Fatty Acids) .... Once Daily 12)  Gabapentin 100 Mg Caps (Gabapentin) .... Take 5 Two Times A Day 13)  Finasteride 5 Mg Tabs (Finasteride) ..[name]. 1 By Mouth Once Daily 14)  Multivitamins   Tabs (Multiple Vitamin) .... Take 1 Tablet By Mouth Once A Day 15)  Vitamin E 600 Unit  Caps (Vitamin E) .... Take 1 Capsule By Mouth Once A Day 16)  Vitamin C 500 Mg  Tabs (Ascorbic Acid) .... Take 1 Tablet By Mouth Once A Day 17)  Albuterol Sulfate (2.5 Mg/39m 0.083% Nebu (Albuterol Sulfate) ...[name] 1 - 2 Puffs As  Needed 18)  Cpap .... At Bedtime 19)  Zetia 10 Mg Tabs (Ezetimibe) .... Take One Tablet By Mouth Daily. 20)  Flonase 50 Mcg/act Susp (Fluticasone Propionate) .... As Needed 21)  Furosemide 40 Mg Tabs (Furosemide) .... Take 1 Tablet By Mouth Two Times A Day 22)  Coumadin 5 Mg Tabs (Warfarin Sodium) .... Take As Directed 23)  Tramadol-Acetaminophen 37.5-325 Mg Tabs (Tramadol-Acetaminophen) .... Take 1 Tablet By Mouth Three Times A Day  Allergies: 1)  ! Vioxx 2)  ! Celebrex 3)  ! Codeine 4)  ! * Temoptic 5)  ! * Alphagan 6)  ! * Colchicine 7)  ! * Xalatan 8)  ! * Azopt 9)  ! Vancomycin 10)  ! * Diltiazem  Anticoagulation Management History:      The patient is taking warfarin and comes in today for a routine follow up visit.  Positive risk factors for bleeding include an age of [age]ears or older.  The bleeding index is 'intermediate risk'.  Positive CHADS2 values include History of HTN.  Negative CHADS2 values include Age > 7543ears old.  His last INR was 3.11.  Anticoagulation responsible provider: McAundra DubinD, Dalton.  INR POC: 2.2.  Cuvette Lot#: [id number] Exp: 06/2011.    Anticoagulation Management Assessment/Plan:      The patient's current  anticoagulation dose is Coumadin 5 mg tabs: take as directed.  The target INR is 2.0-3.0.  The next INR is due 07/20/2010.  Anticoagulation instructions were given to patient.  Results were reviewed/authorized by Bonnita Nasuti, PharmD, BCPS, CPP.         Prior Anticoagulation Instructions: INR 2.5  Continue same dose of 1/2 tablet every day except 1 tablet on Monday.  Recheck INR in 3 weeks.   Current Anticoagulation Instructions: INR 2.2  Coumadin 81m tabs - take 1/2 tab each day EXCEPT 1 tab on MON

## 2010-08-10 ENCOUNTER — Encounter: Payer: Self-pay | Admitting: Internal Medicine

## 2010-08-10 ENCOUNTER — Encounter (INDEPENDENT_AMBULATORY_CARE_PROVIDER_SITE_OTHER): Payer: MEDICARE

## 2010-08-10 DIAGNOSIS — Z7901 Long term (current) use of anticoagulants: Secondary | ICD-10-CM

## 2010-08-10 DIAGNOSIS — I4891 Unspecified atrial fibrillation: Secondary | ICD-10-CM

## 2010-08-10 NOTE — Medication Information (Signed)
Summary: [facility]  Anticoagulant Therapy  Managed by: Porfirio Oar, PharmD Referring MD: Stanford Breed MD, Aaron Edelman PCP: Neena Rhymes MD Supervising MD: Aundra Dubin MD, Grizelda Piscopo Indication 1: Atrial Fibrillation Lab Used: [facility] [place] Site: [place] INR POC 1.2 INR RANGE 2.0-3.0  Dietary changes: no    Health status changes: yes       Details: had GI bug last week that consisted of N/V/D.  Has been back to normal for about 4 days.   Bleeding/hemorrhagic complications: no    Recent/future hospitalizations: no    Any changes in medication regimen? no    Recent/future dental: no  Any missed doses?: yes     Details: Held Coumadin for epidural injection today.   Is patient compliant with meds? yes       Allergies: 1)  ! Vioxx 2)  ! Celebrex 3)  ! Codeine 4)  ! * Temoptic 5)  ! * Alphagan 6)  ! * Colchicine 7)  ! * Xalatan 8)  ! * Azopt 9)  ! Vancomycin 10)  ! * Diltiazem  Anticoagulation Management History:      The patient is taking warfarin and comes in today for a routine follow up visit.  Positive risk factors for bleeding include an age of 71 years or older.  The bleeding index is 'intermediate risk'.  Positive CHADS2 values include History of HTN.  Negative CHADS2 values include Age > 47 years old.  His last INR was 3.11.  Anticoagulation responsible provider: Aundra Dubin MD, Johanthan Kneeland.  INR POC: 1.2.  Exp: 06/2011.    Anticoagulation Management Assessment/Plan:      The patient's current anticoagulation dose is Coumadin 5 mg tabs: take as directed.  The target INR is 2.0-3.0.  The next INR is due 08/10/2010.  Anticoagulation instructions were given to patient.  Results were reviewed/authorized by Porfirio Oar, PharmD.  He was notified by Porfirio Oar PharmD.         Prior Anticoagulation Instructions: INR 3.3  NO COUMADIN TODAY WED 1/18 Coumadin 74m tabs Take 1 tab on MON and 1/2 tab all other days Stop Coumadin 5 days prior to spinal injection   Current  Anticoagulation Instructions: INR 1.2  Take 1 tablet today and tomorrow then resume same dose of 1/2 tablet every day except 1 tablet on Monday.  Recheck INR in 1 week.

## 2010-08-11 ENCOUNTER — Ambulatory Visit: Payer: MEDICARE | Admitting: Physical Medicine & Rehabilitation

## 2010-08-11 ENCOUNTER — Encounter: Payer: MEDICARE | Attending: Physical Medicine & Rehabilitation

## 2010-08-16 DIAGNOSIS — I4891 Unspecified atrial fibrillation: Secondary | ICD-10-CM

## 2010-08-16 DIAGNOSIS — Z7901 Long term (current) use of anticoagulants: Secondary | ICD-10-CM

## 2010-08-18 NOTE — Letter (Signed)
Summary: [facility] Office Visit Note   [facility] Office Visit Note   Imported By: Sallee Provencal 08/08/2010 10:57:22  _____________________________________________________________________  External Attachment:    Type:   Image     Comment:   External Document

## 2010-08-18 NOTE — Medication Information (Signed)
Summary: rov/kh  Anticoagulant Therapy  Managed by: Doran Stabler, PharmD  Referring MD: Stanford Breed MD, Aaron Edelman PCP: Neena Rhymes MD Supervising MD: Caryl Comes MD, Remo Lipps Indication 1: Atrial Fibrillation Lab Used: [facility] [place] Site: [place] INR POC 2.3 INR RANGE 2.0-3.0  Dietary changes: no    Health status changes: no    Bleeding/hemorrhagic complications: no    Recent/future hospitalizations: no    Any changes in medication regimen? no    Recent/future dental: no  Any missed doses?: no       Is patient compliant with meds? yes      Comments: Started back on coumadin Thursday evening. Stopped for epidural injection.   Allergies: 1)  ! Vioxx 2)  ! Celebrex 3)  ! Codeine 4)  ! * Temoptic 5)  ! * Alphagan 6)  ! * Colchicine 7)  ! * Xalatan 8)  ! * Azopt 9)  ! Vancomycin 10)  ! * Diltiazem  Anticoagulation Management History:      The patient is taking warfarin and comes in today for a routine follow up visit.  Positive risk factors for bleeding include an age of 25 years or older.  The bleeding index is 'intermediate risk'.  Positive CHADS2 values include History of HTN.  Negative CHADS2 values include Age > 36 years old.  His last INR was 3.11.  Anticoagulation responsible provider: Caryl Comes MD, Remo Lipps.  INR POC: 2.3.  Cuvette Lot#: [id number].  Exp: 06/2011.    Anticoagulation Management Assessment/Plan:      The patient's current anticoagulation dose is Coumadin 5 mg tabs: take as directed.  The target INR is 2.0-3.0.  The next INR is due 08/31/2010.  Anticoagulation instructions were given to patient.  Results were reviewed/authorized by Doran Stabler, PharmD .         Prior Anticoagulation Instructions: INR 1.2  Take 1 tablet today and tomorrow then resume same dose of 1/2 tablet every day except 1 tablet on Monday.  Recheck INR in 1 week.   Current Anticoagulation Instructions: INR 2.3  Continue to take 1/2 tablet everday except 1  tablet on Mondays. Recheck INR in 3 weeks.

## 2010-08-24 NOTE — Letter (Signed)
Summary: [facility]   Imported By: Sallee Provencal 08/15/2010 16:23:50  _____________________________________________________________________  External Attachment:    Type:   Image     Comment:   External Document

## 2010-08-31 ENCOUNTER — Encounter (INDEPENDENT_AMBULATORY_CARE_PROVIDER_SITE_OTHER): Payer: MEDICARE

## 2010-08-31 ENCOUNTER — Encounter: Payer: Self-pay | Admitting: Cardiovascular Disease

## 2010-08-31 DIAGNOSIS — I4891 Unspecified atrial fibrillation: Secondary | ICD-10-CM

## 2010-08-31 DIAGNOSIS — Z7901 Long term (current) use of anticoagulants: Secondary | ICD-10-CM

## 2010-08-31 LAB — CONVERTED CEMR LAB: POC INR: 3

## 2010-09-08 NOTE — Medication Information (Signed)
Summary: rov/sp  Anticoagulant Therapy  Managed by: Porfirio Oar, PharmD Referring MD: Stanford Breed MD, Aaron Edelman PCP: Neena Rhymes MD Supervising MD: Burt Knack MD, Legrand Como Indication 1: Atrial Fibrillation Lab Used: [facility] [place] Site: [place] INR POC 3.0 INR RANGE 2.0-3.0  Dietary changes: no    Health status changes: no    Bleeding/hemorrhagic complications: no    Recent/future hospitalizations: no    Any changes in medication regimen? no    Recent/future dental: no  Any missed doses?: no       Is patient compliant with meds? yes       Allergies: 1)  ! Vioxx 2)  ! Celebrex 3)  ! Codeine 4)  ! * Temoptic 5)  ! * Alphagan 6)  ! * Colchicine 7)  ! * Xalatan 8)  ! * Azopt 9)  ! Vancomycin 10)  ! * Diltiazem  Anticoagulation Management History:      The patient is taking warfarin and comes in today for a routine follow up visit.  Positive risk factors for bleeding include an age of 70 years or older.  The bleeding index is 'intermediate risk'.  Positive CHADS2 values include History of HTN.  Negative CHADS2 values include Age > 24 years old.  His last INR was 3.11.  Anticoagulation responsible provider: Burt Knack MD, Legrand Como.  INR POC: 3.0.  Cuvette Lot#: [id number].  Exp: 07/2011.    Anticoagulation Management Assessment/Plan:      The patient's current anticoagulation dose is Coumadin 5 mg tabs: take as directed.  The target INR is 2.0-3.0.  The next INR is due 09/28/2010.  Anticoagulation instructions were given to patient.  Results were reviewed/authorized by Porfirio Oar, PharmD.  He was notified by Mliss Sax PharmD Candidate.         Prior Anticoagulation Instructions: INR 2.3  Continue to take 1/2 tablet everday except 1 tablet on Mondays. Recheck INR in 3 weeks.   Current Anticoagulation Instructions: INR 3.0  Continue to take 1/2 tablet daily except on Mondays when you take 1 tablet.  Recheck INR in 4 weeks.

## 2010-09-09 ENCOUNTER — Other Ambulatory Visit: Payer: Self-pay | Admitting: Orthopedic Surgery

## 2010-09-09 ENCOUNTER — Ambulatory Visit (HOSPITAL_COMMUNITY)
Admission: RE | Admit: 2010-09-09 | Discharge: 2010-09-09 | Disposition: A | Discharge status: Home / Self Care | Payer: Medicare Other | Source: Ambulatory Visit | Attending: Orthopedic Surgery | Admitting: Orthopedic Surgery

## 2010-09-09 DIAGNOSIS — N400 Enlarged prostate without lower urinary tract symptoms: Secondary | ICD-10-CM | POA: Insufficient documentation

## 2010-09-09 DIAGNOSIS — E669 Obesity, unspecified: Secondary | ICD-10-CM | POA: Insufficient documentation

## 2010-09-09 DIAGNOSIS — I10 Essential (primary) hypertension: Secondary | ICD-10-CM | POA: Insufficient documentation

## 2010-09-09 DIAGNOSIS — M869 Osteomyelitis, unspecified: Secondary | ICD-10-CM | POA: Insufficient documentation

## 2010-09-09 DIAGNOSIS — L97509 Non-pressure chronic ulcer of other part of unspecified foot with unspecified severity: Secondary | ICD-10-CM | POA: Insufficient documentation

## 2010-09-09 DIAGNOSIS — I4891 Unspecified atrial fibrillation: Secondary | ICD-10-CM | POA: Insufficient documentation

## 2010-09-09 DIAGNOSIS — L02619 Cutaneous abscess of unspecified foot: Secondary | ICD-10-CM | POA: Insufficient documentation

## 2010-09-09 DIAGNOSIS — J4489 Other specified chronic obstructive pulmonary disease: Secondary | ICD-10-CM | POA: Insufficient documentation

## 2010-09-09 DIAGNOSIS — G609 Hereditary and idiopathic neuropathy, unspecified: Secondary | ICD-10-CM | POA: Insufficient documentation

## 2010-09-09 DIAGNOSIS — G4733 Obstructive sleep apnea (adult) (pediatric): Secondary | ICD-10-CM | POA: Insufficient documentation

## 2010-09-09 DIAGNOSIS — M129 Arthropathy, unspecified: Secondary | ICD-10-CM | POA: Insufficient documentation

## 2010-09-09 DIAGNOSIS — L03039 Cellulitis of unspecified toe: Secondary | ICD-10-CM | POA: Insufficient documentation

## 2010-09-09 DIAGNOSIS — J449 Chronic obstructive pulmonary disease, unspecified: Secondary | ICD-10-CM | POA: Insufficient documentation

## 2010-09-09 LAB — CBC
Hemoglobin: 12.5 g/dL — ABNORMAL LOW (ref 13.0–17.0)
MCHC: 33.3 g/dL (ref 30.0–36.0)
WBC: 7.8 10*3/uL (ref 4.0–10.5)

## 2010-09-09 LAB — COMPREHENSIVE METABOLIC PANEL
BUN: 15 mg/dL (ref 6–23)
Calcium: 8.8 mg/dL (ref 8.4–10.5)
Glucose, Bld: 152 mg/dL — ABNORMAL HIGH (ref 70–99)
Sodium: 141 mEq/L (ref 135–145)
Total Protein: 6.4 g/dL (ref 6.0–8.3)

## 2010-09-09 LAB — PROTIME-INR
INR: 2.94 — ABNORMAL HIGH (ref 0.00–1.49)
Prothrombin Time: 30.7 seconds — ABNORMAL HIGH (ref 11.6–15.2)

## 2010-09-18 NOTE — Op Note (Signed)
  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  PROCEDURE:  Left great toe amputation of the MTP joint.  SURGEON:  [name], MD  ANESTHESIA:  General.  ESTIMATED BLOOD LOSS:  Minimal.  ANTIBIOTICS:  2 grams of Kefzol.  DRAINS:  None.  COMPLICATIONS:  None.  TOURNIQUET TIME:  None.  DISPOSITION:  To PACU in stable condition.  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  He has previously undergone a prolonged conservative treatment for salvage of the great toe.  The patient has developed increasing cellulitis, drainage, odor.  Radiographs show lytic changes of the IP joint consistent with osteomyelitis.  Due to progressive ulceration, drainage, and osteomyelitis of the proximal phalanx, the patient presents at this time after failure of conservative care for amputation of the left great toe of the MTP joint.  Risks and benefits were discussed including infection, neurovascular injury, nonhealing of the wound, need for higher-level amputation.  The patient states he understands and wished to proceed at this time.  DESCRIPTION OF PROCEDURE:  The patient was brought to OR room 4 and underwent general anesthetic.  After adequate level of anesthesia was obtained, the patient's left lower extremity was prepped using DuraPrep and draped into a sterile field.  The great toe underwent amputation to the MTP joint.  A fishmouth  incision was made distal to the MTP joint. This was carried down to the joint.  The toe was resected in one block of tissue.  Electrocautery was used for hemostasis.  The wound was irrigated with normal saline.  The incision was closed using 2-0 nylon with a modified vertical mattress suture.  The wound was covered with Adaptic orthopedic sponges, Kerlix and Coban.  A compressive wrap was also applied to his leg.  There was venous stasis insufficiency, brawny edema with pending ulceration.  The patient was extubated and taken to PACU in stable condition.  Prescription for Tylox for pain, postoperative shoe, minimize weightbearing on the left lower extremity. Follow up in the office in 1 week.     [name], MD     MVD/MEDQ  D:  09/09/2010  T:  09/10/2010  Job:  [id number]  Electronically Signed by Meridee Score MD on 09/18/2010 04:32:01 PM

## 2010-09-28 ENCOUNTER — Encounter: Payer: MEDICARE | Admitting: *Deleted

## 2010-09-29 ENCOUNTER — Other Ambulatory Visit: Payer: Self-pay | Admitting: Internal Medicine

## 2010-10-04 ENCOUNTER — Encounter: Payer: MEDICARE | Attending: Physical Medicine & Rehabilitation

## 2010-10-04 ENCOUNTER — Ambulatory Visit: Payer: MEDICARE | Admitting: Physical Medicine & Rehabilitation

## 2010-10-04 DIAGNOSIS — IMO0002 Reserved for concepts with insufficient information to code with codable children: Secondary | ICD-10-CM | POA: Insufficient documentation

## 2010-10-04 DIAGNOSIS — M533 Sacrococcygeal disorders, not elsewhere classified: Secondary | ICD-10-CM | POA: Insufficient documentation

## 2010-10-05 ENCOUNTER — Ambulatory Visit (INDEPENDENT_AMBULATORY_CARE_PROVIDER_SITE_OTHER): Payer: MEDICARE | Admitting: *Deleted

## 2010-10-05 DIAGNOSIS — I4891 Unspecified atrial fibrillation: Secondary | ICD-10-CM

## 2010-10-05 DIAGNOSIS — Z7901 Long term (current) use of anticoagulants: Secondary | ICD-10-CM

## 2010-10-05 LAB — POCT INR: INR: 4.3

## 2010-10-19 ENCOUNTER — Ambulatory Visit (INDEPENDENT_AMBULATORY_CARE_PROVIDER_SITE_OTHER): Payer: MEDICARE | Admitting: *Deleted

## 2010-10-19 DIAGNOSIS — Z7901 Long term (current) use of anticoagulants: Secondary | ICD-10-CM

## 2010-10-19 DIAGNOSIS — I4891 Unspecified atrial fibrillation: Secondary | ICD-10-CM

## 2010-10-19 LAB — POCT INR: INR: 3.1

## 2010-10-25 ENCOUNTER — Encounter: Payer: Self-pay | Admitting: Cardiology

## 2010-10-26 ENCOUNTER — Ambulatory Visit (INDEPENDENT_AMBULATORY_CARE_PROVIDER_SITE_OTHER): Payer: MEDICARE | Admitting: Cardiology

## 2010-10-26 ENCOUNTER — Encounter: Payer: Self-pay | Admitting: Cardiology

## 2010-10-26 VITALS — BP 150/84 | HR 94 | Ht 74.0 in | Wt 339.0 lb

## 2010-10-26 DIAGNOSIS — I4891 Unspecified atrial fibrillation: Secondary | ICD-10-CM

## 2010-10-26 DIAGNOSIS — R609 Edema, unspecified: Secondary | ICD-10-CM

## 2010-10-26 DIAGNOSIS — E7800 Pure hypercholesterolemia, unspecified: Secondary | ICD-10-CM

## 2010-10-26 DIAGNOSIS — Z8679 Personal history of other diseases of the circulatory system: Secondary | ICD-10-CM

## 2010-10-26 DIAGNOSIS — I10 Essential (primary) hypertension: Secondary | ICD-10-CM

## 2010-10-26 NOTE — Assessment & Plan Note (Signed)
Managed by primary care.

## 2010-10-26 NOTE — Assessment & Plan Note (Signed)
Continue diuretic.

## 2010-10-26 NOTE — Assessment & Plan Note (Signed)
Patient remains in sinus rhythm. Continue beta blocker and Coumadin with goal INR 2-3.

## 2010-10-26 NOTE — Assessment & Plan Note (Signed)
Patient sounds to have possible aortic stenosis. Repeat echocardiogram.

## 2010-10-26 NOTE — Patient Instructions (Signed)
Your physician recommends that you schedule a follow-up appointment in: [place] physician has requested that you have an echocardiogram. Echocardiography is a painless test that uses sound waves to create images of your heart. It provides your doctor with information about the size and shape of your heart and how well your heart's chambers and valves are working. This procedure takes approximately one hour. There are no restrictions for this procedure.

## 2010-10-26 NOTE — Progress Notes (Signed)
HPI: Pleasant male for FU of paroxysmal atrial fibrillation, sleep apnea, and pedal edema. Note he did have a cardiac catheterization in March of 1998 that showed normal LV function and a 30-50% right coronary artery. A nuclear study was performed in May of 2004 that showed inferior thinning but no ischemia. His ejection fraction was 57%. Last echocardiogram in March of 2010 showed grossly normal LV function (performed at [facility]). Note following 2004 he transferred his care to [facility] for convenience; but now prefers [facility]. Since I last saw him in April of 2011, the patient denies any dyspnea on exertion, orthopnea, PND,  palpitations, syncope or chest pain. He has chronic pedal edema which is unchanged.   Current Outpatient Prescriptions  Medication Sig Dispense Refill  . albuterol (PROVENTIL) (2.5 MG/3ML) 0.083% nebulizer solution Take 2.5 mg by nebulization every 6 (six) hours as needed.        [name] allopurinol (ZYLOPRIM) 300 MG tablet Take 300 mg by mouth daily.        . Ascorbic Acid (VITAMIN C) 500 MG tablet Take 500 mg by mouth daily.        [name] ezetimibe (ZETIA) 10 MG tablet Take 10 mg by mouth daily.        . finasteride (PROSCAR) 5 MG tablet Take 5 mg by mouth daily.        . fish oil-omega-3 fatty acids 1000 MG capsule Take 2 g by mouth daily.        . fluticasone (FLONASE) 50 MCG/ACT nasal spray 2 sprays by Nasal route as needed.        . Fluticasone-Salmeterol (ADVAIR DISKUS) 250-50 MCG/DOSE AEPB Inhale 1 puff into the lungs every 12 (twelve) hours.        . furosemide (LASIX) 40 MG tablet Take 40 mg by mouth 2 (two) times daily.        [name] gabapentin (NEURONTIN) 100 MG capsule Take 300 mg by mouth 3 (three) times daily.       [name] losartan (COZAAR) 50 MG tablet Take 50 mg by mouth 2 (two) times daily.       . meloxicam (MOBIC) 15 MG tablet Take 15 mg by mouth daily.        . metoprolol (TOPROL-XL) 200 MG 24 hr tablet Take 200 mg by mouth 2 (two) times daily.       . Multiple Vitamin  (MULTIVITAMIN) tablet Take 1 tablet by mouth daily.        . niacin (NIASPAN) 1000 MG CR tablet Take 2,000 mg by mouth at bedtime.        . NON FORMULARY CPAP ventilator at night       . Omega-3 350 MG CAPS Take by mouth daily.        [name] omeprazole (PRILOSEC) 10 MG capsule take 1 capsule by mouth once daily  30 capsule  6  . oxyCODONE (OXYCONTIN) 10 MG 12 hr tablet Take 10 mg by mouth as needed.        . rosuvastatin (CRESTOR) 40 MG tablet Take 40 mg by mouth daily.        [name] tiotropium (SPIRIVA) 18 MCG inhalation capsule Place 18 mcg into inhaler and inhale daily.        . traMADol-acetaminophen (ULTRACET) 37.5-325 MG per tablet Take 1 tablet by mouth as directed.        . vitamin E 200 UNIT capsule Take 200 Units by mouth daily.        [name] warfarin (COUMADIN) 5  MG tablet Take by mouth as directed.           Past Medical History  Diagnosis Date  . HYPERCHOLESTEROLEMIA-PURE   . GOUT   . OBSTRUCTIVE SLEEP APNEA   . Essential hypertension, benign   . VENOUS INSUFFICIENCY   . ASTHMA   . COPD   . VENTRAL HERNIA   . OSTEOARTHRITIS   . CATARACT, HX OF   . PULMONARY HYPERTENSION, HX OF   . Hallux valgus (acquired)   . Obesity, unspecified   . Osteoarthrosis, unspecified whether generalized or localized, unspecified site   . Unspecified glaucoma   . Heart murmur   . Atrial fibrillation     Past Surgical History  Procedure Date  . Hemorrhoid surgery   . Vitrectomy   . Intraocular lens insertion   . Leg surgery   . Knee surgery   . Traeculectomy     History   Social History  . Marital Status: Married    Spouse Name: [name]    Number of Children: [name]  . Years of Education: [name]   Occupational History  . Not on file.   Social History Main Topics  . Smoking status: Never Smoker   . Smokeless tobacco: Not on file  . Alcohol Use: Not on file  . Drug Use: Not on file  . Sexually Active: Not on file   Other Topics Concern  . Not on file   Social History Narrative  . No  narrative on file    ROS: no fevers or chills, productive cough, hemoptysis, dysphasia, odynophagia, melena, hematochezia, dysuria, hematuria, rash, seizure activity, orthopnea, PND, pedal edema, claudication. Remaining systems are negative.  Physical Exam: Well-developed obese in no acute distress.  Skin is warm and dry.  HEENT is normal.  Neck is supple. No thyromegaly.  Chest is clear to auscultation with normal expansion.  Cardiovascular exam is regular rate and rhythm.  Abdominal exam nontender or distended. No masses palpated. Extremities show 1+ edema with chronic skin changes. neuro grossly intact  ECG Sinus rhythm at a rate of 96. Right bundle branch block. Cannot rule out prior inferior infarct.

## 2010-10-26 NOTE — Assessment & Plan Note (Signed)
Blood pressure mildly elevated. I have asked him to follow this at home and he will contact us if his systolic is over 978 or diastolic over 85. We will add additional medicines as needed.

## 2010-10-28 ENCOUNTER — Other Ambulatory Visit: Payer: Self-pay | Admitting: Cardiology

## 2010-11-03 ENCOUNTER — Ambulatory Visit: Payer: MEDICARE | Admitting: Physical Medicine & Rehabilitation

## 2010-11-03 ENCOUNTER — Encounter: Payer: MEDICARE | Admitting: *Deleted

## 2010-11-07 ENCOUNTER — Ambulatory Visit: Payer: MEDICARE | Admitting: Physical Medicine & Rehabilitation

## 2010-11-10 ENCOUNTER — Encounter: Payer: Medicare Other | Attending: Physical Medicine & Rehabilitation

## 2010-11-10 ENCOUNTER — Ambulatory Visit (HOSPITAL_COMMUNITY): Payer: Medicare Other | Attending: Cardiology | Admitting: Radiology

## 2010-11-10 ENCOUNTER — Ambulatory Visit: Payer: Medicare Other | Admitting: Physical Medicine & Rehabilitation

## 2010-11-10 ENCOUNTER — Ambulatory Visit: Payer: MEDICARE | Admitting: Physical Medicine & Rehabilitation

## 2010-11-10 ENCOUNTER — Ambulatory Visit (INDEPENDENT_AMBULATORY_CARE_PROVIDER_SITE_OTHER): Payer: Medicare Other | Admitting: *Deleted

## 2010-11-10 DIAGNOSIS — IMO0002 Reserved for concepts with insufficient information to code with codable children: Secondary | ICD-10-CM

## 2010-11-10 DIAGNOSIS — I4891 Unspecified atrial fibrillation: Secondary | ICD-10-CM

## 2010-11-10 DIAGNOSIS — Z7901 Long term (current) use of anticoagulants: Secondary | ICD-10-CM

## 2010-11-10 DIAGNOSIS — R011 Cardiac murmur, unspecified: Secondary | ICD-10-CM

## 2010-11-10 DIAGNOSIS — M533 Sacrococcygeal disorders, not elsewhere classified: Secondary | ICD-10-CM | POA: Insufficient documentation

## 2010-11-10 MED ORDER — PERFLUTREN PROTEIN A MICROSPH IV SUSP
3.0000 mL | Freq: Once | INTRAVENOUS | Status: AC
  Administered 2010-11-10: 3 mL via INTRAVENOUS

## 2010-11-11 NOTE — Procedures (Signed)
NAME:  Tim [name], Tim [name] NO.:  [id number]  MEDICAL RECORD NO.:  [id number]           PATIENT TYPE:  O  LOCATION:  NINV                         FACILITY:  [facility]  PHYSICIAN:  Charlett Blake, M.D.DATE OF BIRTH:  03-28-1941  DATE OF PROCEDURE:  11/10/2010 DATE OF DISCHARGE:                              OPERATIVE REPORT  PROCEDURE:  Bilateral S1 transforaminal lumbar epidural steroid injection under fluoroscopic guidance.  INDICATIONS:  Lumbar pain.  Pain is only partially responsive to medication management and other conservative care.  Informed consent was obtained after describing risks and benefits of the procedure with the patient.  These include bleeding, bruising, and infection.  He elects to proceed and has given written consent. The patient was placed prone on fluoroscopy table.  Betadine prep, sterile drape.  His INR reviewed, it is 1.1 today.  He has been off Coumadin for 5 days.  A 25-gauge inch and half needle was used to anesthetize skin and subcu tissue with 1% lidocaine x2 mL at each of 2 sites and a 22-gauge, 5-inch spinal needle was inserted first at left S1 foramen under AP and lateral imaging.  Omnipaque 180 demonstrated good nerve root spread followed by injection of 1 mL of 10 mg/mL dexamethasone and 1 mL of 1% MPF lidocaine.  The same procedure was repeated on the right side using same needle injectate and technique.  The patient tolerated the procedure well.  Pre and post injection vitals stable.  Post injection instructions given.  I will see him back in 2 months in followup.     Charlett Blake, M.D. Electronically Signed    AEK/MEDQ  D:  11/10/2010 14:34:50  T:  11/11/2010 00:41:37  Job:  [id number]

## 2010-11-14 ENCOUNTER — Other Ambulatory Visit: Payer: Self-pay | Admitting: Internal Medicine

## 2010-11-16 ENCOUNTER — Encounter: Payer: Medicare Other | Admitting: *Deleted

## 2010-11-18 NOTE — Discharge Summary (Signed)
NAME:  Tim [name], Tim [name] NO.:  [id number]   MEDICAL RECORD NO.:  [id number]          PATIENT TYPE:  INP   LOCATION:  0102                         FACILITY:  [facility]   PHYSICIAN:  Marletta Lor, M.D. LHCDATE OF BIRTH:  Jul 13, 1940   DATE OF ADMISSION:  07/11/2005  DATE OF DISCHARGE:  07/15/2005                                 DISCHARGE SUMMARY   FINAL DIAGNOSIS:  Left lower extremity Streptococcal cellulitis.   ADDITIONAL DIAGNOSES:  1.  Bilateral venous stasis disease.  2.  Bilateral hallux valgus deformities.  3.  Hypertension.  4.  Hyperlipidemia.  5.  Obstructive sleep apnea with pulmonary hypertension.  6.  Morbid obesity.   DISCHARGE MEDICATIONS:  Augmentin 875 mg one b.i.d. for seven additional  days. The patient will resume his preadmission medications. Coumadin 5 mg  four times weekly, 2.5 mg three times weekly on Monday, Wednesday, and  Friday.   ADDITIONAL MEDICATIONS:  1.  Cozaar 50 mg b.i.d.  2.  Metoprolol 200 mg in the a.m. and 100 mg  in the p.m.  3.  Advair one puff b.i.d.  4.  Lasix 40 mg b.i.d.  5.  Allopurinol 200 mg daily.  6.  Aspirin 162 mg daily.  7.  Prilosec 40 mg daily.  8.  Pravachol 40 mg daily.  9.  Provigil 200 mg daily.  10. Niacin 2 gm at bedtime.  11. Proscar 5 mg daily.  12. Spiriva 18 mcg daily.   HISTORY OF PRESENT ILLNESS:  The patient is a 70 year old gentleman with  morbid obesity, chronic venous stasis disease, who presented with increasing  pain, redness, and swelling of the right lower extremity. This has been  associated with fever. The patient was admitted for further evaluation and  treatment of his right lower extremity cellulitis.   LABORATORY DATA/HOSPITAL COURSE:  The patient was admitted to the medical  floor where he was treated with IV Unasyn. After several days and  organization of his temperature and white count this was switched to p.o.  augmentin which he tolerated well. During his hospital  course INRs were  monitored. Initial white count was 19.6 and prior to his discharge this  improved to 7.4. His hospital course was marked by slow but steady  improvement of the discomfort. He had no progression of the area of  cellulitis, but had little improvement in the erythema. Laboratory studies  were unremarkable. Blood sugar was 126 and 117 on two occasions. Hemoglobin  A1c was 5.3. Abdominal ultrasound was unremarkable with mild fatty  infiltration of the liver, with mild hepatosplenomegaly, status post  cholecystectomy.   DISPOSITION:  The patient will be discharged today with office follow-up in  three days. He will be discharged complete with some additional days of  augmentin. He will be discharged on his preadmission medications as  mentioned above. In addition, he will be placed on K-Dur 20 mEq b.i.d. A  repeat CBC and BMET will be obtained at his next office visit.           ______________________________  Marletta Lor, M.D. [facility]  PFK/MEDQ  D:  07/15/2005  T:  07/16/2005  Job:  [id number]

## 2010-11-18 NOTE — Op Note (Signed)
NAME:  Tim [name], Tim [name]               ACCOUNT NO.:  [id number]   MEDICAL RECORD NO.:  [id number]          PATIENT TYPE:  AMB   LOCATION:  SDS                          FACILITY:  [facility]   PHYSICIAN:  [name], MD     DATE OF BIRTH:  03/25/1941   DATE OF PROCEDURE:  05/16/2006  DATE OF DISCHARGE:                                 OPERATIVE REPORT   PREOPERATIVE DIAGNOSES:  1. Clawing, right great toe.  2. Hallux rigidus, right great toe metatarsophalangeal joint.  3. 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   POSTOPERATIVE DIAGNOSES:  1. Clawing, right great toe.  2. Hallux rigidus, right great toe metatarsophalangeal joint.  3. 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   PROCEDURES:  1. Right gastrocnemius recession.  2. Cheilectomy, right great toe metatarsophalangeal joint.  3. Flexor hallux release, right great toe.   SURGEON.:  [name], MD   ANESTHESIA:  General.   ESTIMATED BLOOD LOSS:  Minimal.   ANTIBIOTICS:  1 g of Kefzol.   DRAINS:  None.   COMPLICATIONS:  None.   TOURNIQUET TIME:  None.   DISPOSITION:  To PACU in stable condition.   INDICATION FOR PROCEDURE:  The patient is a 70 year old gentleman with  diabetic insensate neuropathy with a chronic ulcer on the right and left  great toe, who has failed conservative care with pressure unloading and  wound care, ulceration secondary to FHL contracture with clawing of the  right great toe as well as hallux rigidus of the MTP joint and heel cord  contracture secondary to diabetic neuropathy.  Due to the failure of  conservative care, the patient presents at this time for surgical  intervention.  Risks and benefits were discussed including infection,  neurovascular injury, persistent pain, recurrence of the ulcer, nonhealing  of the wound, and need for additional surgery.  The patient states he  understands and wishes  to proceed at this time.   SURGICAL PROCEDURE:  The patient was brought to OR room #4 and underwent a  general anesthetic.  After an adequate level of anesthesia obtained, the  patient's right lower extremity was prepped using DuraPrep and draped into a  sterile field.  A longitudinal 2-inch incision was made 15 cm proximal to  the medial malleolus on the medial aspect of the calf.  This was carried  down to the fascia of the gastroc muscle.  Curved retractors were placed  deep and superficial to the gastrocnemius fascia and the fascia was released  with scissors.  This allowed the patient to achieve dorsiflexion of 30  degrees from dorsiflexion of 0 degrees.  The wound was irrigated with normal  saline and the skin was closed using modified vertical mattress suture with  2-0 nylon.  Attention was then focused over the great toe MTP joint.  The  patient had about 10 degrees of dorsiflexion.  A medial longitudinal  incision was made.  This was carried down to the joint.  A  cheilectomy was  performed at the right great toe metatarsal head.  This allowed 70 degrees  of dorsiflexion.  The wound was irrigated and the wound was closed using  modified vertical mattress with 2-0 nylon.  Attention was then focused on  plantar aspect of the great toe.  A small stab incision was made over the  FHL of the great toe.  A 90-degree retractor was inserted and the tendon was  delivered.  This was incised.  This improved the dorsiflexion of the great  toe.  The chronic ulcer was also debrided of skin and soft tissue.  The  wounds were irrigated with normal saline.  The plantar incision was also  closed with 2-0 nylon.  The wounds were covered with Adaptic orthopedic  sponges, sterile Webril and a Coban dressing from his toes to his knee.  The  patient was extubated and taken to PACU in stable condition.  Planned for  discharge to home.  Follow up in the office in 1 week.      [name], MD   Electronically Signed     MVD/MEDQ  D:  05/16/2006  T:  05/16/2006  Job:  [id number]

## 2010-11-18 NOTE — Op Note (Signed)
NAME:  Tim [name], Tim [name]               ACCOUNT NO.:  [id number]   MEDICAL RECORD NO.:  [id number]          PATIENT TYPE:  AMB   LOCATION:  DAY                          FACILITY:  [facility]   PHYSICIAN:  Timothy E. Rosana Hoes, M.D. DATE OF BIRTH:  Apr 05, 1941   DATE OF PROCEDURE:  04/02/2006  DATE OF DISCHARGE:                                 OPERATIVE REPORT   PREOPERATIVE DIAGNOSIS:  External hemorrhoids.   POSTOPERATIVE DIAGNOSIS:  External hemorrhoids.   PROCEDURE:  Removal external hemorrhoids.   SURGEON:  Timothy E. Rosana Hoes, M.D.   ANESTHESIA:  Local infiltration with IV sedation.   Mr. Brunkhorst is 20.  His current medical history is complicated by sleep  apnea, hypertension, atrial fib, diabetes, chronic obstructive pulmonary  disease and he has external hemorrhoids.  He has been treated in the office  for maximum benefit of conservative management regarding the external  hemorrhoids and the subsequent pruritus ani and the patient still complains.  In fact he insists that these hemorrhoids be removed as they do prevent his  exercise program and so at his request we scheduled this surgery.  He has  been carefully evaluated by anesthesia for his other medical conditions.  A  planned brief IV sedation was planned along with direct infiltration of  local anesthetic.  He agrees and understands.   The patient is taken to the operating room.  He positioned himself supine.  He was gently placed in lithotomy in the stirrups and IV sedation was given.  Perianal area was visualized with some difficulty because of the size of his  buttocks and the single bilobed external fibroepithelial polyp at the  posterior of the anus was injected with local, excised and its wound closed  with a running 3-0 chromic.  No other lesions or abnormalities were noted.  The wound was carefully observed for bleeding.  There was none.  A Gelfoam  gauze and dry sterile dressing applied.  He tolerated it well and anesthesia  was satisfied and the patient was stable.   He and his wife were given written and verbal instructions including  Darvocet for pain and he will be followed in the office.      Timothy E. Rosana Hoes, M.D.  Electronically Signed     TED/MEDQ  D:  04/02/2006  T:  04/03/2006  Job:  [id number]   cc:   Heinz Knuckles. Norins, MD  [address]  [place]  [place] [address]   Mayme Genta, M.D.  Fax: [phone]

## 2010-11-18 NOTE — Discharge Summary (Signed)
[name]  Patient:    Tim [name], Tim [name]                        MRN: [id number] Adm. Date:  [id number] Disc. Date: [id number] Attending:  Charlette Caffey Dictator:   Helayne Seminole, P.A. CC:         Heinz Knuckles. Norins, M.D. [facility]   Discharge Summary  DISCHARGE DIAGNOSES: 1. Heart palpitations. 2. Asthma.  BRIEF ADMISSION HISTORY:  The patient is a 70 year old white male who presented with several episodes of heart palpitations.  The duration was several seconds.  The patient had an episode of darkening vision, weakness, claminess, and sweatiness.  No nausea.  He did have some chest tightness.  No dyspnea, but he did seem to be presyncopal.  No loss of consciousness.  The patient had a similar episode four years ago in 1998.  At that time, he had a catheterization which showed a 30-50% RCA nonobstructive lesion.  The patient does have a history of paroxysmal tachycardia in his childhood and adolescence.  PAST MEDICAL HISTORY:  1. Hypertension.  2. Hypercholesterolemia.  3. Status post lens implant in the left eye on Nov 23, 1999.  4. Glaucoma.  5. 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  6. Hypertension.  7. OSA.  8. Asthma.  9. Gastroesophageal reflux disease. 10. Nonobstructive coronary artery disease, status post adenosine Cardiolite     on March 30, 1999, that was normal.  HOSPITAL COURSE:  CARDIOVASCULAR:  The patient presented with PVCs and heart palpitations with associated chest tightness.  Bigeminy was noted on the monitor.  Enzymes were negative for ischemia.  Cardiac work-up was in 1998 with a catheterization and Cardiolite in 1999 and 2000 that were normal.  The patient was seen by Bruce H. Swords, M.D., who noted that he had atypical symptoms.  The patient was ruled out for MI.  He was scheduled for an outpatient Cardiolite and was scheduled to follow up with Denice Bors. Stanford Breed, M.D., in the office.  DISCHARGE INSTRUCTIONS:   He was instructed to resume his medications as at home.  The patient was to follow up with Legrand Como E. Norins, M.D., and Denice Bors. Stanford Breed, M.D., as needed. DD:  11/14/00 TD:  11/14/00 Job: [id number]

## 2010-11-18 NOTE — Discharge Summary (Signed)
NAME:  Tim [name], Tim [name]               ACCOUNT NO.:  [id number]   MEDICAL RECORD NO.:  [id number]          PATIENT TYPE:  INP   LOCATION:  1536                         [facility]ACILITY[facility]   PHYSICIAN:  Sean A. Loanne Drilling, MD    DATE OF BIRTH:  21-Aug-1940   DATE OF ADMISSION:  10/25/2006  DATE OF DISCHARGE:  10/28/2006                               DISCHARGE SUMMARY   REASON FOR ADMISSION:  Cellulitis.   HISTORY OF PRESENT ILLNESS:  70 year old man admitted by Dr. Loanne Drilling on  October 25, 2006 with cellulitis of the left leg.  Please refer to his  dictated history and physical for details.   HOSPITAL COURSE:  The patient was admitted and treated with antibiotics.  He also was advised to keep his left leg elevated, which he did not do.  He steadily improved, and by October 28, 2006 he was alert, oriented,  ambulatory, eating his usual diet, and was thus discharged home.   His other chronic medical problems were not active during this  hospitalization.   DISCHARGE DIAGNOSES:  1. Cellulitis left leg, better.  2. Other chronic medical problems as noted in the history and      physical.   DISCHARGE MEDICATIONS:  1. Augmentin 875 one tablet twice daily for five more days.  2. Other medications as listed on the history and physical.   DIET:  A heart healthy diet is advised.   ACTIVITY:  Left leg elevation is again advised.   FOLLOW UP:  Follow up with Dr. Linda Hedges within one week and also Dr. Sharol Given  within two weeks.      Sean A. Loanne Drilling, MD  Electronically Signed     SAE/MEDQ  D:  10/28/2006  T:  10/28/2006  Job:  [id number]   cc:   Heinz Knuckles. Norins, MD  [address]  [place] [address]

## 2010-11-18 NOTE — Consult Note (Signed)
[name]  Patient:    Tim [name], Tim [name]                        MRN: [id number] Proc. Date: 12/15/99 Adm. Date:  [id number] Attending:  Nicholaus Bloom CC:         Heinz Knuckles. Norins, M.D. [facility]             [name] Larsson, M.D.                          Consultation Report  FOLLOW-UP EVALUATION  HISTORY OF PRESENT ILLNESS:  [name] comes in for follow-up evaluation of his chronic low back pain on the basis of lumbar degenerative disk disease. Since his previous evaluation, the patient has noted some increased lower back discomfort radiating out into the right buttock region. He states that it is not intolerable but he feels as though things are starting to come back and it has been since October since he had a series of epidural steroid injections and he is asking about a repeat. I advised that it may be a good time to do this. He notes that walking helps his back but sitting at a table or chair or lying in bed more than an hour, lifting and carrying will increase his discomfort.  MEDICATIONS:  The patient brings a very nice spreadsheet of his medications. He is on Cozaar 50 mg 1 a day, Provigil 200 mg per day, Prilosec 1 a day, glucosamine 1000 mg twice a day, furosemide 20 mg a day, allopurinol 300 mg per day, Pravachol 20 mg a day, Proscar 5 mg per day, Serevent and flovent inhalers. He takes Ultram p.r.n.  PHYSICAL EXAMINATION:  VITAL SIGNS:  Blood pressure is 152/55, heart rate 97, respiratory rate 18, O2 saturations 95% and pain level is 3/10.  The patient exhibited tenderness over his right SI joint and right flank region in the region of the facet joint area. Hyperextension and forward flexion did not increase his discomfort. Straight leg raise signs are negative. Deep tendon reflexes were symmetric.  IMPRESSION: 1. Low back pain on the basis of degenerative disk disease which seems to be    increased to a mild extent in severity. 2. Other medical  problems per Dr. Linda Hedges.  DISPOSITION: 1. Continue current medications. 2. Will reschedule in for a series of lumbar epidural steroid injections to    see if we can get him under a little bit better control. He tells me    that his foot ulcer is totally healed. I plan to see him back in followup    next week to begin a series of lumbar epidural steroid injections. DD:  12/15/99 TD:  12/15/99 Job: [id number]

## 2010-11-18 NOTE — H&P (Signed)
[place]. [facility]  Patient:    Tim [name], Tim [name]                        MRN: [id number] Adm. Date:  [id number] Disc. Date: [id number] Attending:  Nicholaus Bloom CC:         Heinz Knuckles. Norins, M.D. [facility] T. Gershon Crane, M.D.  Samuel L. Elonda Husky, M.D.   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  HISTORY OF PRESENT ILLNESS:  This patient had complicated cataract surgery performed on the right eye in December 2001 and in the left eye on Nov 22, 1999.  During the cataract procedure, the left posterior capsule was torn and broken and the cataract implant had to be repositioned.  Following this procedure, the patient has been troubled with chronic and recurrent inflammation with marked floaters and blurred vision.  The patient was seen by Dr. Phineas Douglas, associated with Dr. Rutherford Guys, and was found to have an inflammation and possible vitreous hemorrhage of the left eye.  Examination in my office revealed a visual acuity of 20/100 right eye, 20/60 left eye without correction and improved to 20/15 right eye, 20/20 left eye with correction. Slit lamp examination revealed vitreous touch syndrome with a vitreous strand extending from the posterior vitreous to the corneal incision around the posterior chamber intraocular lens implant, which has been decentered slightly downward and inward.  There was iris attachment also to this vitreous band. The posterior capsule was opened and the implant appeared to be residing in the ciliary sulcus.  Fundus examination revealed a relatively clear vitreous with vitreous fibrillar degeneration and what was felt to be a vitritis.  The retina was attached and no peripheral retinal tears or detachment areas were noted.  The patient was treated with topical  steroids but continued to have chronic uveitis.  This has not cleared despite frequent topical steroids.  It was elected therefore to proceed with a vitrectomy surgery to remove the vitreous adherent to the cornea and iris in hopes of reducing the tendency to intraocular inflammation.  The patient was given oral discussion and printed information concerning the procedure and possible complications.  He signed an informed consent and arrangements were made for his outpatient admission at this time.  PAST MEDICAL HISTORY:  The patient is under the care of Dr. Adella Hare and is said to be in rather poor general health.  The patient takes multiple medications including Cozaar, Prinivil, ______, Lasix, ______, metoprolol, Pravachol, Proscar, Serevent, Flovent, Advair, and aspirin.  There, however, did not appear to be any contraindication to the proposed surgery. Arrangements were therefore made for his outpatient admission.  ALLERGIES:  The patient has a history of glaucoma and was treated with a laser trabeculoplasty in the right eye in July 1999.  The patient stated that he had a reaction or allergy to a GLAUCOMA MEDICATION with asthma and irregular heartbeat.  This probably represented a beta-blocker type glaucoma drop.  PHYSICAL EXAMINATION:  VITAL SIGNS:  As recorded on admission.  Blood pressure 146/66, temperature 97.2, pulse 79, respirations 16.  GENERAL:  The patient is a pleasant, well-nourished, well-developed male in no acute distress.  HEENT:  Ocular exam as noted above.  CHEST:  Lungs clear to percussion and auscultation.  HEART:  Normal sinus rhythm.  No cardiomegaly, no murmurs.  ABDOMEN:  Negative.  EXTREMITIES:  Negative.  ADMISSION DIAGNOSES: 1. 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 2. Chronic uveitis/iritis left eye. 3. Pseudophakia left eye. 4. History of glaucoma with laser trabeculoplasty surgery.  SURGICAL PLAN:  Pars  plana vitrectomy with excision of the vitreous band attached to the iris cornea. DD:  11/09/00 TD:  11/10/00 Job: [id number]

## 2010-11-18 NOTE — Discharge Summary (Signed)
NAME:  Tim [name], Tim [name]               ACCOUNT NO.:  [id number]   MEDICAL RECORD NO.:  [id number]          PATIENT TYPE:  INP   LOCATION:  5409                         FACILITY:  [name]   PHYSICIAN:  Gwendolyn Grant, M.D. LHCDATE OF BIRTH:  01/07/1941   DATE OF ADMISSION:  08/08/2005  DATE OF DISCHARGE:  08/12/2005                                 DISCHARGE SUMMARY   DISCHARGE DIAGNOSIS:  1.  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  2.  Vancomycin drug reaction with fever and rash, resolved.  3.  Moderate obesity.  4.  Obstructive sleep apnea, with pulmonary hypertension. Continue      continuous positive airway pressure at night.  5.  Hypertension.  6.  Asthma.  7.  Paroxysmal atrial fibrillation, on chronic anticoagulation.  8.  History of gout.  9.  Dyslipidemia.  10. Gastroesophageal reflux disease.   DISCHARGE MEDICATIONS:  Augmentin 875 p.o. b.i.d. x10 days (prescription at  patient request, see below).   All other medications are as prior to admission without change, and include:  1.  Advair 500/50 inhalation b.i.d.  2.  Provigil 200 mg p.o. daily.  3.  Lasix 40 mg p.o. b.i.d..  4.  Allopurinol 3 mg p.o. daily.  5.  Proscar 5 mg p.o. daily.  6.  Prilosec 20 mg p.o. b.i.d.  7.  Coumadin, varying doses, continue as prior to admission.  8.  Pravachol 40 mg p.o. at bedtime.  9.  Cozaar 50 mg p.o. b.i.d.  10. Multivitamin plus E plus C each once daily.  11. Spiriva 1 inhalation .  12. Niaspan 2 g p.o. at bedtime.  13. Aspirin 162 mg p.o. daily.  14. Mobic 50 mg p.o. daily.  15. Potassium 20 mEq p.o. b.i.d.  16. Metoprolol 100 mg p.o. b.i.d.   DISPOSITION:  The patient is discharged home in medically stable condition.  Erythema of lower extremities improved.  Drug rash improved.   CONSULTATIONS:  Dr. Janice Coffin of infectious disease.   HOSPITAL FOLLOWUP:  Will be with primary M.D., to be arranged  within the  next 2 weeks by patient. Will return to the emergency room with fever,  increasing redness, or other problems prior to that time.   HOSPITAL COURSE BY PROBLEM:  1.  Recurrent cellulitis secondary to venous stasis. The patient is a      pleasant 70 year old gentleman with multiple medical problems include      obesity with venous stasis who has had recurrent hospitalizations due to      increasing erythema, swelling of pain of his lower extremities,      generally the right greater than left. He has chronic hemosiderin      changes in this case as before within a day or two of completing his      home antibiotic regimen. He had increasing swelling and erythema      concerning for infection. He was referred back to the hospital and again      seen by consultation of Dr. Janice Coffin, who  had seen him previously      at [facility].  He was initially started on Zosyn, and there was      question of contamination of blood culture, but meanwhile vancomycin was      added.  Addition of vancomycin only worsened the picture with fever and      rash, as the blood culture returned with 1/2 bacillus, likely      contaminant. A repeat culture is pending and likely to be negative. At      the time of this dictation,the patient has been off all antibiotics,      Zosyn and vancomycin, for greater than 48 hours, and has had improvement      in his cellulitis-type leg symptoms simply with rest and elevation. This      being said, the patient is reluctant to not take antibiotics, as he says      even at home the antibiotics seem to be what keeps bad erythema and      swelling away, and as such he was given a 10-day prescription for      Augmentin at his request, with  further followup with primary M.D.      __________ question chronic suppression, though this is not recommended      by infectious disease at this time. He was also seen in consultation by      CVTS, who recommended elevation  when possible, the wearing of      compression stockings while up and ambulatory, which the patient agrees      to, and of course weight loss to improve overall venous pressures.  2.  Other medical issues. The patient's other medical issues are as listed.      No changes were made in his regimen except as listed.      Gwendolyn Grant, M.D. [facility]  Electronically Signed     VL/MEDQ  D:  08/12/2005  T:  08/14/2005  Job:  [id number]

## 2010-11-18 NOTE — Procedures (Signed)
[place]. [facility]  Patient:    Tim [name], Tim [name]                        MRN: [id number] Proc. Date: 01/05/00 Adm. Date:  [id number] Attending:  Nicholaus Bloom                           Procedure Report  PROCEDURE:  Lumbar epidural steroid injection.  DIAGNOSIS:  Degenerative disk disease of the lumbar spine.  INTERVAL HISTORY:  The patient has noted marked improvement after his first two epidurals.  He is here for his third.  PHYSICAL EXAMINATION:  VITAL SIGNS:  Blood pressure 139/60, heart rate 77, respiratory rate 12, O2 saturation 97%, pain level is 1 out of 10, temperature is 98.2.  MUSCULOSKELETAL/NEUROLOGIC:  His back shows good healing from his previous injection site.  His neurologic exam is grossly unchanged.  DESCRIPTION OF PROCEDURE:  After informed consent was obtained, the patient was placed in a sitting position and monitored.  His back was prepped with Betadine x 3.  A skin wheal was raised at the L5-S1 interspace with 1% lidocaine.  A 20-gauge Tuohy needle was introduced to the lumbar epidural space to loss of resistance to preservative-free normal saline with great difficulty.  There was no CSF nor blood.  Medrol 80 ml in 7 ml of preservative-free normal saline was gently injected.  The needle was flushed with preservative-free normal saline and removed intact.  DISPOSITION: 1. Resume previous diet. 2. Limitation of activities per instruction sheet. 3. Continue on current medications. 4. Follow up with me in four weeks. DD:  01/05/00 TD:  01/05/00 Job: [id number]

## 2010-11-18 NOTE — Consult Note (Signed)
[facility]  Patient:    Tim [name], Tim [name]                        MRN: [id number] Proc. Date: 02/20/00 Adm. Date:  [id number] Attending:  Nicholaus Bloom CC:         Heinz Knuckles. Norins, M.D. [facility]                          Consultation Report  FOLLOWUP EVALUATION:  Tim [name] comes in for followup evaluation of his chronic low back pain on the basis of degenerative disk disease.  Since his last evaluation, the patient has had massage therapy by Veverly Fells over in [place] and has been to [facility] for physical therapy.  He has read Dr. Ruel Favors book on exercises and has started doing these and ______ massage therapy, he has improved fairly significantly.  He rates his pain at 1/10.  He cannot sit for more than five minutes without the occurrence of his pain and it intensifies as he sits.  Leaning forward increases the discomfort.  He has had some rectal bleeding, which was evaluated by Dr. Heinz Knuckles. Norins and found to be hemorrhoids by sigmoidoscopy.  He continues on several medications including Prilosec, Cozaar, furosemide, Provigil, metoprolol, Pravachol, allopurinol, Proscar, Flovent, Serevent and albuterol.  He is not using the Ultram very frequently.  EXAMINATION  VITAL SIGNS:  Blood pressure 159/69.  Heart rate is 85.  Respiratory rate is 16 with O2 saturation of 96%.  Pain level is 1/10.  NEUROLOGIC:  His neuro-exam revealed deep tendon reflexes to be symmetric in the lower extremities with negative straight leg raise signs.  He does have tenderness at the lumbosacral junction.  His pain is made worse by forward flexion and reduced by hyperextension.  He is developing some edema of his lower extremities and reddish discoloration of the right lower extremity.  IMPRESSION 1. Low back pain on the basis of degenerative disk disease, which is    improving. 2. Stasis dermatitis versus cellulitis to the right lower extremity, for which    he plans  to see Dr. Linda Hedges. 3. Other medical problems per Dr. Adella Hare.  DISPOSITION 1. Continue on current medical regimen. 2. Continue with McKenzie exercises. 3. Continue with physical therapy. 4. Continue with massage therapy p.r.n. 5. Follow up with me in three months or earlier if needed. DD:  02/20/00 TD:  02/21/00 Job: [id number]

## 2010-11-18 NOTE — H&P (Signed)
NAME:  Tim [name], Tim [name]               ACCOUNT NO.:  [id number]   MEDICAL RECORD NO.:  [id number]          PATIENT TYPE:  INP   LOCATION:  0102                         FACILITY:  [facility]   PHYSICIAN:  Sean A. Loanne Drilling, M.D. [facility] - B OF BIRTH:  [birth year]   DATE OF ADMISSION:  08/08/2005  DATE OF DISCHARGE:                                HISTORY & PHYSICAL   REASON FOR ADMISSION:  Cellulitis.   HISTORY OF PRESENT ILLNESS:  A 70 year old man who has been admitted on  three other occasions over the past six months with cellulitis of the legs.  He finished his Augmentin prescription several days ago.  He immediately  developed several days of moderate erythema of the right leg.  He has  associated warmth and pain there.   PAST MEDICAL HISTORY:  1.  Asthma.  2.  GERD.  3.  Hypertension.  4.  Sleep apnea.  5.  Bilateral venous stasis.  6.  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   MEDICATIONS:  1.  Advair 500/50, one puff twice daily.  2.  Provigil 200 mg daily.  3.  Lasix 40 mg twice daily.  4.  Allopurinol 300 mg daily.  5.  Proscar 5 mg daily.  6.  Prilosec 20 mg twice daily.  7.  Coumadin at a varying dosage.  8.  Pravachol 40 mg q.h.s.  9.  Lopressor 200 mg twice daily.  10. Cozaar 50 mg twice daily.  11. Spiriva one puff daily.  12. Niaspan 2,000 mg q.h.s.  13. Aspirin 162 mg daily.  14. Mobic 15 mg daily.  15. Potassium chloride 20 mEq twice daily.  16. Neurontin t.i.d., apparently 300 mg per dose.   SOCIAL HISTORY:  He is retired.  He is married.  His wife is with him today.   FAMILY HISTORY:  Negative for the above.   REVIEW OF SYSTEMS:  He has gained some weight recently, but he denies the  following:  Fever, sore throat, earache, syncope, chest pain, shortness of  breath, nausea, vomiting, rectal bleeding, hematuria, incontinence.   PHYSICAL EXAMINATION:  VITAL SIGNS:  Blood pressure 140/81, heart rate 84,  temperature  is 97.2, the weight is not addressed.  SKIN:  Not diaphoretic.  HEENT:  No proptosis.  No periorbital swelling.  Pharynx:  No erythema.  Mucous membranes are moist.  NECK:  Supple.  CHEST:  Clear to auscultation.  CARDIOVASCULAR:  No JVD.  Regular rate and rhythm.  There is a 3/6 systolic  murmur present.  Pedal pulses can not be appreciated because the patient has  bandages on his feet.  ABDOMEN:  Soft, obese, nontender.  No __________  mass.  No hernia.  The  obesity limits the examination.  EXTREMITIES:  He has chronic venous stasis changes in both legs.  The right  leg distal to the knee has moderate erythema, warmth, and tenderness.  Both  feet are chronically bandaged and these bandages are not removed at this  time .  NEUROLOGIC:  Alert, oriented, does not appear  anxious nor depressed.  Cranial nerves appear to be intact.  Gait is observed in the office to be  normal and he readily moves all four extremities.   IMPRESSION:  1.  Chronic venous insufficiency of the lower extremities.  2.  Recurrent cellulitis due to number one.  3.  Heart murmur is noted.  4.  Hypertension.  5.  Asthma.  6.  Gastroesophageal reflux disease.  7.  Hypertension.  8.  Sleep apnea.  9.  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   PLAN:  1.  Readmit to the hospital.  2.  Blood cultures.  3.  Resume Unasyn __________  .  4.  Consult CVTS.  5.  I discussed code status with the patient, and he requests full code.      However, he would not want to be started nor maintained on artifical      life support measures if there was not a reasonable chance of a      functional recovery.           ______________________________  Jacelyn Pi Loanne Drilling, M.D. [facility]     SAE/MEDQ  D:  08/08/2005  T:  08/08/2005  Job:  [id number]   cc:   Heinz Knuckles. Norins, M.D. [facility]  [address]  [place] [address]

## 2010-11-18 NOTE — Discharge Summary (Signed)
NAME:  Tim [name], Tim [name] NO.:  [id number]   MEDICAL RECORD NO.:  [id number]          PATIENT TYPE:  INP   LOCATION:  5006                         FACILITY:  [facility]   PHYSICIAN:  [name], MD     DATE OF BIRTH:  December 03, 1940   DATE OF ADMISSION:  01/09/2008  DATE OF DISCHARGE:  01/12/2008                               DISCHARGE SUMMARY   DIAGNOSIS:  Abscess cellulitis, left foot.   PROCEDURE:  Admission IV antibiotics and wound care.   Discharged to home in stable condition.   Follow up in the office in 1 week.   HISTORY OF PRESENT ILLNESS:  The patient is a 70 year old gentleman with  diabetic insensate neuropathy with cellulitis of his left foot with  ulceration of the third toe with Wagner grade 1 ulceration.  The patient  presented to the emergency room with the above-mentioned findings and  was admitted for IV antibiotics and possible surgical intervention.  The  patient's hospital course was essentially unremarkable.  He was  admitted, placed on IV Zosyn.  Over the course of his hospital stay, the  cellulitis had resolved in his foot, the ulceration improved, and the  patient was discharged to home on p.o. antibiotics with follow up in the  office in 1 week.   FINAL DIAGNOSIS:  Cellulitis and abscess, left foot with diabetic  insensate neuropathy.  Follow up in 1 week.      [name], MD  Electronically Signed     MVD/MEDQ  D:  02/18/2008  T:  02/19/2008  Job:  [id number]

## 2010-11-18 NOTE — H&P (Signed)
[place]. [facility]  Patient:    JED, KUTCH Visit Number: [id number] MRN: [id number]          Service Type: MED Location: [phone] Attending Physician:  Lily Lovings Dictated by:   Denice Bors. Stanford Breed, M.D. [facility] Admit Date:  11/08/2001 Discharge Date: 11/09/2001                           History and Physical  CHIEF COMPLAINT: This is a 70 year old gentleman, with past medical history of hypertension, hyperlipidemia, asthma, gastroesophageal reflux disease, who presents with new onset atrial fibrillation.  HISTORY OF PRESENT ILLNESS: The patient has had prior cardiac evaluations for chest pain.  He has had catheterization in 1997 that revealed nonobstructive disease.  He apparently had an echocardiogram in our office approximately two to three weeks ago secondary to exertional dyspnea.  The results are pending at the time of this dictation.  Yesterday he was seen at [facility] secondary to "dizziness."  He had an electrocardiogram and a CT of his head that apparently were normal, although the records are not available.  He was [facility] home with a diagnosis of vertigo and his symptoms have improved.  This a.m. the patient awoke with palpitations that were described as a "racing sensation."  There was no associated presyncope, syncope, chest pain, or shortness of breath.  The patient presented to the emergency room and was found to be in atrial fibrillation with rapid ventricular response, and we were asked to further evaluate.  He has had some dyspnea on exertion but he denies any orthopnea or PND.  He has chronic pedal edema.  There has been no exertional chest pain.  PAST MEDICAL HISTORY:  1. Hypertension.  2. Hyperlipidemia.  3. History of asthma.  4. Gastroesophageal reflux disease.  5. BPH.  6. History of gout.  There is no diabetes mellitus.  PAST SURGICAL HISTORY:  1. Status post cholecystectomy.  2.  Tonsillectomy.  3. Prior knee surgery.  4. Multiple eye surgeries.  SOCIAL HISTORY: He does not smoke, nor does not consume alcohol.  He does consume caffeine.  FAMILY HISTORY: Negative for coronary artery disease.  (His mother had a myocardial infarction at age 22).  PRESENT MEDICATIONS:  1. Advair.  2. Cozaar 50 mg p.o. q.d.  3. Provigil 200 mg p.o. q.d.  4. Lasix 40 mg p.o. q.d.  5. Allopurinol 300 mg p.o. q.d.  6. Metoprolol 75 mg p.o. b.i.d.  7. Pravachol 20 mg p.o. q.d.  8. Proscar 5 mg p.o. q.d.  9. Prilosec 40 mg p.o. q.d. 10. Aspirin. 11. Glucosamine chondroitin. 12. Antivert.  ALLERGIES:  1. VIOXX.  2. CELEBREX.  3. COLCHICINE.  4. CODEINE.  REVIEW OF SYSTEMS: He denies any headaches or fevers or chills.  There is no productive cough or hemoptysis.  There is no dysphagia, odynophagia, melena, or hematochezia.  There is no dysuria or hematuria.  There is no rash or seizure activity.  There is no orthopnea or PND, but there is pedal edema. The remainder of the systems are negative.  PHYSICAL EXAMINATION:  VITAL SIGNS: Blood pressure 140/100.  He is afebrile.  His pulse is in the 140 range.  Saturation 96% on room air.  GENERAL: He is well-developed and morbidly obese.  He is in no acute distress.  SKIN: Warm and dry.  He does have chronic skin changes on his lower extremities.  HEENT: Unremarkable, with normal  eyelids.  NECK: Supple with normal upstroke bilaterally and there are no bruits noted. There was no jugular venous distention, no thyromegaly noted.  CHEST: Clear to auscultation with normal expansion.  CARDIOVASCULAR: Tachycardic rate and irregular rhythm.  No murmurs, rubs, or gallops noted.  ABDOMEN: Examination difficulty due to the patients obesity.  There are no masses appreciated and no hepatosplenomegaly noted.  He has no abdominal bruit.  He has 2+ femoral pulses bilaterally and no bruits.  EXTREMITIES: Edema 2+ to the knees  bilaterally.  There are also chronic skin changes with mild erythema.  There are no cords palpated.  He has 2+ dorsalis pedis pulses bilaterally.  NEUROLOGIC: Grossly intact.  LABORATORY DATA: His laboratories are not available at the time of this dictation.  Chest x-ray shows no acute disease.  Electrocardiogram shows atrial fibrillation with rapid ventricular response.  DIAGNOSES:  1. Atrial fibrillation.  2. History of nonobstructive coronary artery disease by catheterization in     1997.  3. Hypertension.  4. Hyperlipidemia.  5. Chronic lower extremity edema.  6. Gastroesophageal reflux disease.  PLAN: The patient presents with atrial fibrillation with a rapid ventricular response of new onset.  It appears to have began in the past 12 hours.  We will admit and begin IV Cardizem in addition to his Lopressor for rate control.  If he does not convert overnight then it would be worthwhile to cardiovert the patient in the morning.  We will obtain records from [name] from his evaluation in the emergency room yesterday.  This will allow [name] to be sure of the timing of his atrial fibrillation as he did have an electrocardiogram and apparently was in sinus rhythm at that time.  We will discontinue his Cozaar with the addition of diltiazem.  We will begin IV heparin for anticoagulation and he will need Coumadin long-term as he does have embolic risk factors of hypertension.  He did have an echocardiogram in our office in the past two weeks and we will obtain those results.  We will also check a TSH. Dictated by:   Denice Bors. Stanford Breed, M.D. [facility] Attending Physician:  Lily Lovings DD:  11/08/01 TD:  11/10/01 Job: [id number]

## 2010-11-18 NOTE — H&P (Signed)
NAME:  Tim [name], DUNNAWAY NO.:  [id number]   MEDICAL RECORD NO.:  [id number]          PATIENT TYPE:  INP   LOCATION:  0101                         FACILITY:  [name]   PHYSICIAN:  Tommy Medal, MD      DATE OF BIRTH:  1941/04/11   DATE OF ADMISSION:  10/25/2006  DATE OF DISCHARGE:                              HISTORY & PHYSICAL   STAT HISTORY AND PHYSICAL   PRIMARY CARE Taryll Reichenberger:  Heinz Knuckles. Norins, MD.   CHIEF COMPLAINT:  Leg pain.   HISTORY OF PRESENT ILLNESS:  [name] is a 70 year old male with  multiple medical problems, who presented with worsening left leg and  foot pain and worsening redness.  He had recent surgery by Dr. Meridee Score at [name] involving left gastrocnemius recession and left great  toe cheilectomy.  He has had somewhat of a bumpy recovery since then,  with difficulty healing of the incision on the left great toe.  He has  been prescribed doxycycline for the past 10 days by Dr. Sharol Given.  Today,  the patient noted that the pain was becoming worse, and he felt that it  was developing significant redness.  The patient has had multiple cases  of cellulitis in the past.  He denies any fever.   Of note, he had an episode while in the emergency department of sharp  abdominal pain, which he had not had prior to his presentation.  He has  obtained relief after IV morphine.   PAST MEDICAL HISTORY:  1. Hypertension.  2. Paroxysmal atrial fibrillation.  3. Hyperlipidemia.  4. Nonobstructive CAD per clinic notes.  5. Asthma.  6. GERD.  7. Lower extremity venous insufficiency with history of cellulitis and      previous ulcers.  8. Osteoarthritis.  9. BPH.  10.Multiple eye surgeries.  11.Glaucoma.  12.Status post cholecystectomy.  13.Status post gastrocnemius recession surgery on both lower      extremities, most recently on the left in February of 2008.  14.Gout.   MEDICATIONS:  1. Coumadin 2.5 mg Monday, Wednesday, Friday, 5 mg on  other days.  2. Cozaar 50 mg p.o. b.i.d.  3. Metoprolol 250 mg p.o. b.i.d.  4. Advair 250/50 one puff b.i.d.  5. Albuterol MDI p.r.n.  6. Flomax 0.4 mg p.o. once a day.  7. Lasix 40 mg p.o. once a day.  8. Allopurinol 300 mg p.o. once a day.  9. Spiriva 18 mcg inhaled once a day.  10.Crestor 20 mg p.o. at bedtime.  11.Proscar 5 mg p.o. once a day.  12.Omeprazole 40 mg p.o. once a day.  13.CPAP at bedtime, 8 cm of pressure.   ALLERGIES AND DRUG INTOLERANCES:  Multiple, including:  1. VANCOMYCIN.  2. VIOXX.  3. CELEBREX.  4. CODEINE.  5. CARDIZEM-CD.  6. COLCHICINE.   SOCIAL HISTORY:  The patient is a nonsmoker and does not drink alcohol.   FAMILY HISTORY:  Mother died at age 52 from an MI, father died at age 21  from pulmonary fibrosis.   REVIEW OF SYSTEMS: Positive for neuropathy in feet, leg  and back pain.  Otherwise, 10 systems negative other than HPI.   PHYSICAL EXAMINATION:  VITAL SIGNS:  Temp 99.1, blood pressure 98/59,  pulse 106, oxygen saturation 97% on room air.  GENERAL:  The patient is breathing comfortably in no apparent distress  and was actually asleep when I initially went to examine him.  HEENT:  Sclerae are anicteric, oropharynx clear.  NECK:  Supple, no masses, no carotid bruits or JVD.  CARDIOVASCULAR:  Regular rate and rhythm, 2/6 systolic ejection murmur  at the right upper sternal border, normal S1 and S2.  CHEST:  Clear to auscultation bilaterally.  ABDOMEN:  Obese, mild discomfort to palpation, nondistended, normal  bowel sounds.  EXTREMITIES:  1+ edema bilaterally in the distal lower extremities.  Significant left lower extremity edema, erythema, and some degree of  warmth.  Bandage in place on the left great toe.  No pus expressed from  incision.  NEUROLOGIC:  Alert and oriented x3, cranial nerves grossly intact,  moving all extremities well.  SKIN:  Pink plaques noted on the right side of his scalp.  Distal left  lower extremity erythema as  noted above.  MUSCULOSKELETAL:  No joint effusions.   LABORATORY VALUES:  White blood cell count 12.6 (87% neutrophils),  hemoglobin 12.6, hematocrit 36, platelets 168.   Sodium 137, potassium 4.0, chloride 102, bicarb 25, BUN 14, creatinine  0.9, platelets 137, calcium 9.3, AST 22, ALT 23, alkaline-phosphatase  57, total bilirubin 1.2, lipase normal.   EKG, which I personally reviewed, shows sinus tachycardia at 102 beats  per minute, otherwise normal.   ASSESSMENT/PLAN:  A 70 year old male with multiple medical comorbidities  as outlined above presenting with left lower extremity cellulitis.  It  is of some concern that he had recent surgery in this region, but it  does not appear to be a postoperative related infection.  He is  hemodynamically stable, and does not have evidence of sepsis at this  time.  The transient abdominal pain that he experienced in the emergency  department is somewhat odd, and currently has improved with intravenous  narcotics.  The etiology is unclear, but his laboratory evaluation as  noted is normal.   1. We will admit to Dr. Adella Hare.  2. We will initiate IV antibiotic therapy with Zosyn, which has been      started in the emergency department.  He had two sets of blood      cultures drawn prior to starting the antibiotic therapy.  3. We will continue his extensive home medical regimen for his      underlying comorbidities, including hypertension, hyperlipidemia,      asthma, and sleep apnea.  We will hold his Coumadin for the time      being until we get his INR back and a definitive plan has been      established.  4. We will consult Dr. Sharol Given, who did his recent left lower extremity      surgery to have him evaluate the incision and weigh in with any      further recommendations.  5. Abdominal pain: transient, etiology unclear, labs normal.  If      recurs/persists, he might need a CT scan. 6. Prophylaxis:  The patient is on a PPI, and is on  anticoagulation      therapy so no further DVT prophylaxis is indicated.      Tommy Medal, MD  Electronically Signed     JJC/MEDQ  D:  10/25/2006  T:  10/26/2006  Job:  [id number]

## 2010-11-18 NOTE — Procedures (Signed)
[facility]  Patient:    Tim [name], Tim [name]                        MRN: [id number] Proc. Date: 12/29/99 Adm. Date:  [id number] Attending:  Nicholaus Bloom CC:         [name] Larsson, M.D.             Heinz Knuckles Norins, M.D. [facility]                           Procedure Report  DIAGNOSIS:  Degenerative disk disease of the lumbar spine.  NAME OF PROCEDURE:  Lumbar epidural steroid injection.  ANESTHESIOLOGIST:  Nicholaus Bloom, M.D.  INTERVAL HISTORY:  The patient has noted overall improvement after the first injection and rates his pain at 2/10.  PHYSICAL EXAMINATION:  VITAL SIGNS:  Blood pressure 148/68.  Heart rate is 88.  Respiratory rate is 20. O2 sat is 95%.  Temperature is 97.9.  GENERAL:  Pain level is 2/10.  BACK AND NEUROLOGIC:  His back shows good healing and his neurological exam is unchanged.  PROCEDURE:  After informed consent was obtained the patient was placed in the sitting position and monitored.  His back was prepped with Betadine times three.  A skin wheel was raised at the L4-5 interspace with 1% lidocaine.  A 20-gauge Tuohy needle was introduced in the lumbar epidural space with loss of resistance to preservative-free normal saline.  There was no CSF nor blood.  Eighty milligrams of Medrol and 10 ml of preservative-free normal saline were gently injected.  The needle was flushed with preservative-free normal saline and removed intact.  POSTPROCEDURE CONDITION:  Stable.  DISCHARGE INSTRUCTIONS: 1. Resume previous diet. 2. Limitation of activities per instruction sheet. 3. Continue on current medications. 4. Follow up with me in one week for repeat injection. DD:  12/29/99 TD:  12/31/99 Job: [id number]

## 2010-11-18 NOTE — Op Note (Signed)
NAME:  Tim [name], Tim [name]               ACCOUNT NO.:  [id number]   MEDICAL RECORD NO.:  [id number]          PATIENT TYPE:  AMB   LOCATION:  SDS                          FACILITY:  [facility]   PHYSICIAN:  [name], MD     DATE OF BIRTH:  1941-06-09   DATE OF PROCEDURE:  08/08/2006  DATE OF DISCHARGE:                               OPERATIVE REPORT   PREOPERATIVE DIAGNOSIS:  Heel cord contracture with hallux rigidus, left  foot.   POSTOPERATIVE DIAGNOSIS:  Heel cord contracture with hallux rigidus,  left foot.   PROCEDURE:  1. Left gastrocnemius recession.  2. Left great toe cheilectomy.   SURGEON:  [name], MD   ANESTHESIA:  General.   ESTIMATED BLOOD LOSS:  Minimal.   ANTIBIOTICS:  1 gram of Kefzol.   DRAINS:  None.   COMPLICATIONS:  None.   TOURNIQUET TIME:  None.   DISPOSITION:  To PACU in stable condition.   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  The patient has failed conservative  care and has persistent ulceration despite conservative treatment and  presents at this time for surgical intervention.  Risks and benefits  were discussed including infection, neurovascular injury, persistent  pain, need for additional surgery.  The patient states he understands  and wished proceed.   DESCRIPTION OF PROCEDURE:  The patient was brought to OR room 5 and  underwent a general anesthetic.  After adequate level of anesthesia  obtained, the patient's left lower extremity was prepped using DuraPrep  and draped into a sterile field.  Attention was first focused on the  gastrocnemius muscle fascial tissue.  A 2 cm incision was made centered  10 cm proximal to the medial malleolus.  Blunt dissection was carried  down to the gastrocnemius fascia.  The gastrocnemius fascia was  released.  This provided dorsiflexion with the knee extended  from  lacking 20 degrees to neutral dorsiflexion to having 20 degrees of  dorsiflexion with a total of 40 degrees of increased dorsiflexion.  The  wound was irrigated normal saline.  Hemostasis was obtained.  The skin  incision was closed using a modified vertical mattress suture with 2-0  nylon.  Attention was then focused on the great toe and medial  longitudinal incision was made over the MTP joint.  This was carried  down, the retinaculum was incised, elevated and a cheilectomy was  performed of the great toe first metatarsal head and the base of the  proximal phalanx.  The wound was irrigated with normal saline.  Hemostasis was obtained, the patient also had improvement of  approximately 40 degrees dorsiflexion of the great toe.  The incision  was closed using modified vertical mattress suture with 2-0 nylon.  The  wounds were covered with Adaptic orthopedic sponges, Webril and a Coban  dressing.  The patient was extubated, taken to PACU in stable condition.  Plan for 23-hour observation.      [name], MD  Electronically  Signed     MVD/MEDQ  D:  08/08/2006  T:  08/08/2006  Job:  [id number]

## 2010-11-18 NOTE — Consult Note (Signed)
NAME:  Tim [name], Tim [name]               ACCOUNT NO.:  [id number]   MEDICAL RECORD NO.:  [id number]          PATIENT TYPE:  OIB   LOCATION:  3707                         FACILITY:  [facility]   PHYSICIAN:  Sigmund I. Gaynelle Arabian, M.D.DATE OF BIRTH:  04/29/1941   DATE OF CONSULTATION:  DATE OF DISCHARGE:  05/17/2006                                   CONSULTATION   SUBJECTIVE:  This is a 70 year old obese white male, newly diagnosed insulin-  dependent diabetic, status post right great toe surgery, admitted for 23-  hour observation.  Postoperatively, the patient had two spontaneous voids,  both of which had gross blood, and the patient has severe dysuria.  His  urine has cleared now but he continues to have dysuria.   PAST HISTORY:  Significant for:  1. Elevated cholesterol.  2. Diabetes.  3. Sleep apnea.  4. Hypertension.   PAST SURGERIES:  1. Cholecystectomy.  2. Hemorrhoidectomy.  3. Tonsillectomy.   MEDICATIONS:  1. Omeprazole 20 mg per day.  2. Coumadin 2.5/5 alternating dosages.  3. Cozaar 30 mg per day.  4. Metoprolol 200 mg a day.  5. Advair 500/50 one puff each day.  6. Albuterol inhaler two puffs p.r.n..  7. Provigil 200 mg in the morning.  8. Lasix 40 mg per day.  9. Allopurinol 300 mg per day.  10.Cozaar 20 mg per day.  11.Niaspan 1000 mg twice a day.  12.Proscar 5 mg per day.   ALLERGIES:  1. VANCOMYCIN.  2. CODEINE.  3. VIOXX.  4. ARICEPT.  5. XALATAN.   SOCIAL HISTORY:  Patient is married x3.  His wife is 73 and he has no  children.   REVIEW OF SYSTEMS:  Significant for dysuria and gross hematuria x2  postoperatively.   PHYSICAL EXAM:  GENERAL:  Shows an obese white male, in no acute distress.  He has a sleep apnea breathing apparatus in place.  VITALS:  His blood pressure is 123/58, pulse 72, respiratory rate 16, weight  350 pounds.  NECK:  Supple, nontender.  CHEST:  Clear to P&A.  ABDOMEN:  Obese, positive bowel sounds.  No organomegaly or masses.  GENITOURINARY:  Shows normal male external genitalia.  The penis and urethra  are normal; the glans is normal.  EXTREMITIES:  No cyanosis or edema.  SKIN:  Normal to inspection and palpation.  PSYCHOLOGIC:  Shows normal orientation to time, person, and place.   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  He has had dysuria and gross  hematuria, and probably has hemorrhagic cystitis.  Note his creatinine is  0.4.  The patient will be given Cipro 500 mg b.i.d., and will return to the  office in two weeks for:  1. Re-evaluation of his urinalysis.  2. NMP-22  3. CT scan with contrast.  4. Flexible cystoscopy.      Sigmund I. Gaynelle Arabian, M.D.  Electronically Signed     SIT/MEDQ  D:  05/17/2006  T:  05/18/2006  Job:  [id number]   cc:   [name], MD

## 2010-11-18 NOTE — Op Note (Signed)
[place]. [facility]  Patient:    Tim [name], Tim [name]                        MRN: [id number] Proc. Date: 11/09/00 Attending:  Garey Ham, M.D. CC:         Heinz Knuckles. Norins, M.D. [name] T. Gershon Crane, M.D.  Samuel L. Elonda Husky, M.D.   Operative Report  PREOPERATIVE DIAGNOSIS:  Vitreous touch syndrome left eye, chronic uveitis/iritis.  POSTOPERATIVE DIAGNOSIS:  Vitreous touch syndrome left eye, chronic uveitis/iritis.  OPERATION:  Posterior vitrectomy through pars plana using vitreous infusion suction cutter, excision of vitreous bands attached to the cornea and iris, repositioning of intraocular lens implant.  SURGEON:  Garey Ham, M.D.  ASSISTANT:  Nurse.  ANESTHESIA:  General.  OPHTHALMOSCOPY:  As previously described.  OPERATIVE PROCEDURE:  After the patient was prepped and draped, a lid speculum was inserted in the left eye.  The eye was turned downward and a superior rectus traction suture placed.  A peritomy was performed adjacent to the limbus from the 9:30 to 4 oclock position.  Three sclerotomy sites were repaired 3.5 mm from the limbus using the MVR blade and 20-gauge needle.  The 4 mm vitreous infusion terminal was secured in place at the 4 oclock position with a 5-0 white mattress Dacron suture.  The tip could be seen projecting into the vitreous cavity.  The fiberoptic light pipe was inserted at the 2 oclock position and the handpiece, the vitreous infusion suction cutter, at the 10 oclock position.  Initially, the upper sclerotomy sites were plugged and attention paid to the anterior segment.  Using the MVR blade, incision was made at the corneal limbus at the 12 oclock position.  The blade was swept across the anterior chamber to the area of attachment of the vitreous band. The band itself was extremely elastic and when the knife was pulled nasally from the temporal incision the band was not initially severed and  stretched across the anterior chamber.  After multiple attempts, the band was severed. Attention was then paid to the posterior segment and the vitreous instruments inserted.  Slow vitreous infusion suction cutting was then begun from an anterior to posterior direction.  The vitreous was fibrinous, especially anterior behind the intraocular lens implant.  This was gradually severed. Using the handpiece, the vitreous infusion suction cutter, the probe was brought into the anterior chamber, and once again the capsule opened very slightly where it was adherent to the iris and all adhesions of the posterior or anterior capsule to the iris were removed.  The vitreous band was again checked to make sure that it was completely severed and excised from the cornea.  The fundus was inspected with the contact lens and with indirect ophthalmoscopy revealing a clear vitreous and attached retina with normal optic nerve, blood vessels, and macula.  Peripheral scleral depression was carried out revealing no peripheral retinal tears or detachment areas.  It was then elected to close.  The corneal incision site at the 12 oclock position was closed with a single 10-0 interrupted radial nylon suture.  The sclerotomy sites were closed with 7-0 interrupted Vicryl sutures.  The conjunctiva was brought forward and closed with a running 7-0 Vicryl suture.  Depo-Garamycin and dexamethasone were injected in the subtenon space inferiorly.  The patient, during the procedure, was given 125 mg of soluble Depo-Medrol to reduce the systemic intraocular inflammation.  Maxitrol ointment was instilled in  the conjunctival cul-de-sac and a light patch and protector shield applied. Duration of procedure one hour.  The patient tolerated the procedure well in general, left the operating room for the recovery room, and subsequently to the 23-hour observation unit. DD:  11/09/00 TD:  11/10/00 Job: [id number]

## 2010-11-18 NOTE — Discharge Summary (Signed)
[place]. [facility]  Patient:    Tim [name], Tim [name]                        MRN: [id number] Adm. Date:  [id number] Disc. 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  HOSPITAL COURSE:  The patient was evaluated preoperatively and felt to be in satisfactory condition for the proposed surgery.  He therefore was taken into the operating room where under general anesthesia a posterior vitrectomy was carried out through the pars plana with also anterior chamber severing and excision of the vitreous which had been adherent to the corneal surface.  the patient tolerated the hour under general anesthesia well and was taken to the recovery room and subsequently to the 23 hour observation unit.  The patient was seen on the evening of surgery and the following morning and felt to be progressing well.  Vital signs stabilized.  The operated eye appeared to have a clear cornea without the corneal touch vitreous adhesion.  The pupil was round and no longer peaked.  The anterior chamber was deep and clear, and the fundus appeared clear as seen through the undilated pupil.  On the following morning, it was felt that the patient had achieved maximal hospital benefit and could be discharged home to be followed in the office.  DISCHARGE INSTRUCTIONS:  The patient has been given a printed list of discharge instructions on the care and use of the operated eye.  DISCHARGE OCULAR MEDICATIONS:  Tobradex ophthalmic solution one drop four times a day and Maxitrol ointment at bedtime.  FOLLOW-UP:  Follow up appointment in my office in 5-7 days.  DISCHARGE CONDITION:  Improved.  DISCHARGE  DIAGNOSES: 1. Vitreous touch syndrome, left eye. 2. Chronic and recurrent iritis, uveitis, left eye. DD:  11/10/00 TD:  11/12/00 Job: [id number]

## 2010-11-18 NOTE — Op Note (Signed)
[place]. [facility]  Patient:    Tim [name], Tim [name]                        MRN: [id number] Proc. Date: 04/23/00 Adm. Date:  [id number] Disc. Date: [id number] Attending:  Nicholaus Bloom                           Operative Report  PREOPERATIVE DIAGNOSIS:  Right knee medial meniscus tear.  POSTOPERATIVE DIAGNOSIS:  Right knee medial and lateral meniscal tears.  Right knee tricompartmental grade III chondromalacia.  PROCEDURE:  Right knee EUA followed by arthroscopic partial medial and lateral meniscectomies.  Right knee tricompartmental chondroplasty.  SURGEON:  Audree Camel. Noemi Chapel, M.D.  ASSISTANT:  Kirstin Adelberger, P.A.  ANESTHESIA:  Local and MAC.  OPERATIVE TIME:  30 minutes.  COMPLICATIONS:  None.  INDICATIONS:  Mr. Spivak is a 70 year old gentleman who has had three to four months of increasing right knee pain with signs and symptoms on MRI documenting a medial meniscus tear that has failed conservative care and is now to undergo arthroscopy.  DESCRIPTION OF PROCEDURE:  Mr. Karge is brought to the operating room on April 23, 2000, after a block had been placed on the holding room.  He was placed on the operating table in the supine position.  His right knee was examined under anesthesia.  Range of motion from 0 to 125 degrees with 2 to 3+ crepitation.  Knee stable ligamentous examination.  There was some very mild erythema distally in his lower legs consistent with venous insufficiency which he takes a fluid pill for, although, he did not take this today.  Because of this, I gave him Ancef 1 gram IV preoperatively for prophylactically and will also treat him on Keflex postoperatively for prophylaxis to make sure he does not get a lower edematous cellulitis.  He has no evidence of cellulitis tody. His right leg was then prepped using sterile Betadine and draped using sterile technique.  Originally through an inferolateral portal, the arthroscope  with a pump attached placed and through an anterior perimedial portal, an arthroscopic probe was placed.  On initial inspection of medial compartment, he was found to have 75% grade III chondromalacia, medial femoral condyle which was thoroughly debrided, medial tibial plateau showed 50% grade III changes which was debrided.  He was found to have a split radial tear of the posterior horn of the medial meniscus of which 30 to 40% was resected back to a stable rim.  The rest of the medial meniscus was intact.  The intercondylar notch was inspected.  Anterior and posterior cruciate ligaments were found to be normal.  Lateral compartment inspected.  He had 20 to 30% grade III chondromalacia, the rest grade II changes.  This was debrided.  He had tearing of 25% of the inner surface of the posterior and lateral horn of the lateral meniscus which was resected back to a stable rim.  Patellofemoral joint inspected.  30 to 40% grade III and the rest grade II chondromalacia which was debrided.  The patella tracked normally.  Moderate synovitis of the medial and lateral gutters were debrided.  Otherwise they were free of pathology.  After this was done, it was felt that all pathology had been satisfactorily addressed.  The instruments were removed.  Portals were closed with 3-0 nylon suture and injected with 0.25% Marcaine with epinephrine and 5 mg of morphine. A sterile dressing  was applied.  The patient was awakened and taken to the recovery room in stable condition.  FOLLOW-UP:  Mr. Moede will be followed as an outpatient on Vicodin, Voltaren, and Keflex 500 mg t.i.d. for prophylaxis.  See him back in the office in a week for sutures out and follow-up. DD:  04/23/00 TD:  04/23/00 Job: [id number]

## 2010-11-18 NOTE — Procedures (Signed)
[name]  Patient:    Tim [name], Tim [name]                        MRN: [id number] Proc. Date: 12/19/99 Adm. Date:  [id number] Attending:  Nicholaus Bloom CC:         [name] Larsson, M.D.             Heinz Knuckles Norins, M.D. [facility]                           Procedure Report  PROCEDURE:  Lumbar epidural steroid injection.  DIAGNOSIS:  Degenerative disk disease of the lumbar spine.  INTERVAL HISTORY:  The patient was seen last week and we elected to go ahead and resume a series of lumbar epidural steroid injections. He presents with his pain 4/10. His neuro symptoms are unchanged.  PHYSICAL EXAMINATION:  VITAL SIGNS:  Blood pressure 152/74, heart rate 87, respiratory rate 16, O2 saturations 95%, pain level is 4/10 and temperature is 97.1.  NEUROLOGIC:  Unchanged from last week.  DESCRIPTION OF PROCEDURE:  After informed consent was obtained, the patient was placed in the sitting position and monitored. The patients back was prepped and draped with Betadine x 3. A skin wheal was raised at the L4-5 interspace with 1 percent lidocaine. A 20 gauge Tuohy needle was introduced to the lumbar epidural space to loss of resistance to preservative free normal saline. There was no cerebrospinal fluid nor blood. 80 mg of Medrol and 10 ml of preservative free normal saline was gently injected. The needle was flushed with preservative free normal saline and removed intact.  CONDITION POST PROCEDURE:  Stable.  DISCHARGE INSTRUCTIONS:  Resume previous diet. Limitations in activities per instruction sheet. Continue on current medications. Follow-up with me next week for a second in a series of lumbar epidural steroid injections. DD:  12/19/99 TD:  12/21/99 Job: [id number]

## 2010-11-18 NOTE — Assessment & Plan Note (Signed)
[place] OFFICE NOTE   NAME:Tim [name], Tim [name]                      MRN:          [id number]  DATE:02/19/2006                            DOB:          06-14-1941    Mr. Boeve is a 70 year old gentleman well-known to me and followed for  multiple medical problems including history of hypertension, nonobstructive  coronary artery disease, dyslipidemia, history of asthma, history of GERD,  history of BPH, history of gout, history of morbid obesity, history of lower  extremity venous insufficiency with recurrent stasis cellulitis ulcer,  history of osteoarthritis,  history of paroxysmal atrial fibrillation,  history of palpitations.   The patient presents today reporting that he has had a history of a UTI  several weeks ago.  This was confirmed by urinalysis and he was treated with  Ciprofloxacin 250 b.i.d. for seven days.  Urinary burning and incontinence  resolved but he has never felt fully well.  He continues to have some  weakness, occasional chills but no documented fevers.   Patient has been followed for palpitations.  He is followed at [place].  He reports he is having increased problems with  isolated PVCs as well as sustained irregular rhythms worse in the evening.  He has had no chest pain or chest discomfort.  He has had mild decreased  exercise tolerance which he attributes to fatigue and malaise secondary to  problem #1 and does not see this as associated with chest discomfort or  cardiovascular conditions.   Patient is followed for respiratory problems as noted.  He has had increased  shortness of breath but no wheezing.  He has had some burning in his lungs.  He has had no cough.  Patient had been participating in pulmonary rehab and  is under the care of [name].  He has had no productive  cough.   NEUROLOGIC:  Patient has had some problems with  disequilibrium and balance  problems.  These have not been severe.  He has had no falls.   CURRENT MEDICATIONS:  1. Advair 500/50 one inhalation b.i.d.  2. Provigil 200 mg daily.  3. Lasix 40 mg b.i.d.  4. Allopurinol 300 mg daily.  5. Proscar 5 mg daily.  6. Prilosec 20 mg b.i.d.  7. Coumadin as directed.  8. Crestor 20 mg q.p.m.  9. Metoprolol 200 mg b.i.d.  10.Cozaar 50 mg b.i.d.  11.Vitamin E.  12.Vitamin C.  13.Multivitamins.  14.Spiriva 18 mcg one inhalation daily.  15.Niaspan 2000 mg daily.  16.Mobic 15 mg daily.  17.Neurontin 600 mg t.i.d.  18.Flonase nasal spray as needed.   CHART REVIEW:  1. Last chest x-ray from August 08, 2005, with mild diffuse      parabronchial thickening and mild COPD.  2. Abdominal ultrasound from July 11, 2005, status post cholecystectomy      with fatty infiltration of the liver noted.  3. CTA pulmonary arteries January 20, 2005, by his pulmonologist showed      prominence of central pulmonary vasculature with peripheral tapering  likely due to pulmonary hypertension.  Patient did wear an event      recorder from [place] in May of 2005, which was      unremarkable.   REVIEW OF SYSTEMS:  Patient has had no documented fevers, had mild chills  but not rigors.  No __________ ENT complaints.  CARDIOVASCULAR:  As above.  RESPIRATORY:  Patient with cough and shortness of breath.  He has had some  thick sputum but not purulent in nature.   PHYSICAL EXAMINATION:  GENERAL APPEARANCE:  This is an obese Caucasian  gentleman who is in no acute distress with no increased work of breathing.  VITAL SIGNS:  Temperature was 97.7, blood pressure 133/69, pulse 77, no  weight recorded.  HEENT:  Grossly normal.  CHEST:  Patient is moving air well.  There are no wheezes, rhonchi or rales.  No accessory musculature is in use.  CARDIOVASCULAR:  There are 2+ radial pulses, precordium is quiet,  He had a  regular rate and rhythm.  I  did not appreciate any sustained arrhythmia.  I  did note occasional PVC.  ABDOMEN:  Obese.  EXTREMITIES:  Patient is wearing support stockings.  He does not seem to  have any significant peripheral edema.   A 12-lead electrocardiogram revealed the patient to have a normal sinus  rhythm with no acute injury.  A two-page rhythm strip was normal with no  PVCs or arrhythmias.   ASSESSMENT/PLAN:  1. Infectious disease:  The patient was treated with Cipro for urinary      tract infection.  He currently is without fever, no rigors but mild      chills.  No evidence of active infection at this time, either from a      genitourinary perspective or respiratory perspective.  Would not      institute antibiotics at this time.  2. Cardiovascular:  Patient with an unremarkable examination including      normal 12-lead electrocardiogram.  Suspect he is having some mild      increased irritability with increased palpitations.  I have no evidence      of any sustained arrhythmias.  No evidence of recurrent paroxysmal      atrial fibrillation.  Plan:  Patient was told he could participate in      all of his normal physical activities as long as he was not hindered by      either pain or significant lightheadedness, dizziness related to      irregular rhythms.  He should follow up with his cardiologist in regard      to any adjustment in medications for better control of his PVCs.  3. Pulmonary:  Patient with known chronic obstructive pulmonary disease      seemingly stable at this time with no abnormal findings on physical      examination.  4. Neurologic:  Patient with mild disequilibrium and balance problem.  His      exam is nonfocal.  There is no indication for CNS imaging.   SUMMARY:  This is a gentleman with multiple medical problems as outlined  above.  His examination today is unremarkable.  I have asked him to follow  up with his pulmonologist and with his cardiologist.                                   Heinz Knuckles. Norins, MD   MEN/MedQ  DD:  02/20/2006  DT:  02/20/2006  Job #:  [id number]   cc:   Holli Humbles, M.D.  Fuller Song, M.D.  Jacklynn Lewis

## 2010-11-18 NOTE — Discharge Summary (Signed)
[place]. [facility]  Patient:    Tim [name], Tim [name] Visit Number: [id number] MRN: [id number]          Service Type: MED Location: [phone] Attending Physician:  Lily Lovings Dictated by:   Davis Gourd, P.A. Admit Date:  11/08/2001 Disc. Date: 11/09/01   CC:         Heinz Knuckles. Norins, M.D. [facility]   Referring Physician Discharge [place]  DATE OF BIRTH:  [birth year]  PROCEDURES:  None.  HISTORY OF PRESENT ILLNESS:  Tim [name] is a 70 year old male with a history of nonobstructive coronary artery disease by catheterization in 1997 and negative stress test, who has a longstanding history of tachypalpitations.  He has no known documented arrhythmia, but he presented on Nov 08, 2001, with atrial fibrillation with a rate in the 150s-160s.  He was admitted for management of arrhythmia and further evaluation.  HOSPITAL COURSE:  The patient was started on a Cardizem drip after getting a bolus.  The patient converted to sinus rhythm and was in sinus rhythm by 2:30 p.m. on Nov 08, 2001. His symptoms had begun somewhere between 7:30 and 8 a.m. on the same day.  The patient was monitored overnight and remained in sinus rhythm.  His Cardizem was changed from IV to p.o.  He had been on Cozaar prior to admission for blood pressure and this was held because of the Cardizem.  The next day, the patient was still in sinus rhythm and had no shortness of breath or chest pain.  He had initially been started on heparin, but with the conversion to sinus rhythm in less than 24 hours and no other significant risk factors, he was stable to be maintained on an aspirin a day instead of Coumadin.  The patient was ambulatory without difficulty and considered stable for discharge on Nov 09, 2001.  CHEST X-RAY:  The heart size is enlarged, but stable and the lungs are clear of acute disease.  LABORATORY DATA:  Hemoglobin 12.2, hematocrit 34.8, WBC 7.3, platelets 171. PTT 69 on  heparin.  Sodium 140, potassium 4.1, chloride 106, CO2 24, BUN 13, creatinine 0.9, glucose 138.  CK-MB and troponin I negative for MI x 2. TSH 2.017.  UA negative.  ACTIVITY:  His activity level is to be as tolerated.  DIET:  He is to stick to a low-fat and low-salt diet.  FOLLOW-UP:  He is to follow up with Legrand Como E. Norins, M.D., as needed.  He is to follow up with Denice Bors. Stanford Breed, M.D., and call for an appointment and see him in a month.  DISCHARGE MEDICATIONS:  1. Lopressor 50 mg one-and-a-half tablets b.i.d. with one tablet additional     q.d. p.r.n. palpitations.  2. Cardizem CD 180 mg q.d.  3. Cozaar is on hold for now.  4. Advair b.i.d.  5. Aspirin 81 mg q.d.  6. Provigil 200 mg q.d.  7. Lasix 40 mg q.d.  8. Zyloprim 300 mg q.d.  9. Pravachol 20 mg two tablets q.d. 10. Proscar 5 mg q.d. Dictated by:   Davis Gourd, P.A. Attending Physician:  Lily Lovings DD:  11/09/01 TD:  11/09/01 Job: [id number]

## 2010-11-23 ENCOUNTER — Ambulatory Visit (INDEPENDENT_AMBULATORY_CARE_PROVIDER_SITE_OTHER): Payer: Medicare Other | Admitting: *Deleted

## 2010-11-23 DIAGNOSIS — Z7901 Long term (current) use of anticoagulants: Secondary | ICD-10-CM

## 2010-11-23 DIAGNOSIS — I4891 Unspecified atrial fibrillation: Secondary | ICD-10-CM

## 2010-11-23 LAB — POCT INR: INR: 1.7

## 2010-11-26 ENCOUNTER — Other Ambulatory Visit: Payer: Self-pay | Admitting: Internal Medicine

## 2010-11-30 ENCOUNTER — Ambulatory Visit (INDEPENDENT_AMBULATORY_CARE_PROVIDER_SITE_OTHER): Payer: Medicare Other | Admitting: *Deleted

## 2010-11-30 DIAGNOSIS — I4891 Unspecified atrial fibrillation: Secondary | ICD-10-CM

## 2010-11-30 DIAGNOSIS — Z7901 Long term (current) use of anticoagulants: Secondary | ICD-10-CM

## 2010-11-30 LAB — POCT INR: INR: 3.5

## 2010-12-06 ENCOUNTER — Ambulatory Visit: Payer: Medicare Other | Admitting: Physical Medicine & Rehabilitation

## 2010-12-06 ENCOUNTER — Encounter: Payer: Medicare Other | Attending: Physical Medicine & Rehabilitation

## 2010-12-06 DIAGNOSIS — IMO0002 Reserved for concepts with insufficient information to code with codable children: Secondary | ICD-10-CM | POA: Insufficient documentation

## 2010-12-06 DIAGNOSIS — E1142 Type 2 diabetes mellitus with diabetic polyneuropathy: Secondary | ICD-10-CM

## 2010-12-06 DIAGNOSIS — M533 Sacrococcygeal disorders, not elsewhere classified: Secondary | ICD-10-CM | POA: Insufficient documentation

## 2010-12-07 NOTE — Assessment & Plan Note (Signed)
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  He has had increasing lateral aspect of the foot pain in the left lower extremity. He had an S1 selective nerve root block on Nov 10, 2010, which resulted in improvement in his little toe pain, but he has noticed some increasing lateral border of the foot pain on the left side, now that he has resumed walking program.  He only walks about 6 minutes at a time at this point, but is trying to increase.  He has had no other new medical problems in the interval time.  He has had no fevers or chills.  Average pain is 5/10, uses oxycodone on a very occasional basis, maybe 10 oxycodone taken over the last 2 months.  His pain is worse with walking, bending, and standing in the left foot.  Improves with rest, medications, injections.  He was evaluated by Dr. Sharol Given who had the patient start some Achilles tendon stretching exercises and has a followup with Dr. Sharol Given next week.  REVIEW OF SYSTEMS:  Positive for numbness in bilateral feet below the knees and some in the hands, trouble walking, as well as limb swelling, shortness of breath, and sleep apnea.  SOCIAL HISTORY:  He is married, lives with his wife.  No alcohol use or illegal drug use.  Nonsmoker.  PHYSICAL EXAMINATION:  VITAL SIGNS:  Blood pressure 157/73, pulse 74, respirations 18, O2 sat 96% on room air. GENERAL:  No acute distress.  Mood and affect appropriate.  Gait is with a cane. MUSCULOSKELETAL:  His left foot has no erythema.  He has chronic bilateral lower extremity edema, wears compression hose.  He has evidence of left great toe amputation with well-healed surgical site. He has no tenderness to palpation over the lateral border of the foot. He has no tenderness of Achilles tendon.  He has good range of motion in the Achilles, although  not quite normal.  He has absent sensation below the knees in bilateral lower extremities to pinprick.  IMPRESSION:  Left lateral foot pain, complex multifactorial patient.  I do not think this represents worsening of the S1 radiculitis.  He still has some relief of his left little toe pain which he noticed after the block.  He certainly could have some progression of his diabetic neuropathy and this could be a symptom of that, however, I would like him to first see Dr. Sharol Given and see if there is any musculoskeletal aspect to this pain and get an x-ray at Dr. Jess Barters office.  If everything checks out well from that respect, we will assume this is a diabetic neuropathy pain and increase his gabapentin 600 t.i.d.  Discussed with patient, agrees with plan.     Charlett Blake, M.D. Electronically Signed    AEK/MedQ D:  12/06/2010 13:40:12  T:  12/07/2010 00:59:22  Job #:  [id number]  cc:   [name], MD Fax: [phone]

## 2010-12-14 ENCOUNTER — Ambulatory Visit (INDEPENDENT_AMBULATORY_CARE_PROVIDER_SITE_OTHER): Payer: Medicare Other | Admitting: *Deleted

## 2010-12-14 ENCOUNTER — Encounter (HOSPITAL_COMMUNITY): Payer: Self-pay | Admitting: Cardiology

## 2010-12-14 DIAGNOSIS — I4891 Unspecified atrial fibrillation: Secondary | ICD-10-CM

## 2010-12-14 DIAGNOSIS — Z7901 Long term (current) use of anticoagulants: Secondary | ICD-10-CM

## 2010-12-14 LAB — POCT INR: INR: 2.2

## 2011-01-11 ENCOUNTER — Ambulatory Visit (INDEPENDENT_AMBULATORY_CARE_PROVIDER_SITE_OTHER): Payer: Medicare Other | Admitting: *Deleted

## 2011-01-11 DIAGNOSIS — I4891 Unspecified atrial fibrillation: Secondary | ICD-10-CM

## 2011-01-11 DIAGNOSIS — Z7901 Long term (current) use of anticoagulants: Secondary | ICD-10-CM

## 2011-01-12 ENCOUNTER — Other Ambulatory Visit: Payer: Self-pay | Admitting: Cardiology

## 2011-02-08 ENCOUNTER — Ambulatory Visit (INDEPENDENT_AMBULATORY_CARE_PROVIDER_SITE_OTHER): Payer: Medicare Other | Admitting: *Deleted

## 2011-02-08 DIAGNOSIS — I4891 Unspecified atrial fibrillation: Secondary | ICD-10-CM

## 2011-02-08 DIAGNOSIS — Z7901 Long term (current) use of anticoagulants: Secondary | ICD-10-CM

## 2011-02-08 LAB — POCT INR: INR: 2.7

## 2011-02-09 ENCOUNTER — Other Ambulatory Visit: Payer: Self-pay | Admitting: Internal Medicine

## 2011-02-17 ENCOUNTER — Other Ambulatory Visit: Payer: Self-pay | Admitting: Internal Medicine

## 2011-02-24 ENCOUNTER — Other Ambulatory Visit: Payer: Self-pay | Admitting: Internal Medicine

## 2011-03-07 ENCOUNTER — Ambulatory Visit: Payer: Medicare Other | Admitting: Physical Medicine & Rehabilitation

## 2011-03-07 ENCOUNTER — Encounter: Payer: Medicare Other | Attending: Physical Medicine & Rehabilitation

## 2011-03-07 DIAGNOSIS — IMO0002 Reserved for concepts with insufficient information to code with codable children: Secondary | ICD-10-CM | POA: Insufficient documentation

## 2011-03-07 DIAGNOSIS — G609 Hereditary and idiopathic neuropathy, unspecified: Secondary | ICD-10-CM

## 2011-03-07 DIAGNOSIS — M47817 Spondylosis without myelopathy or radiculopathy, lumbosacral region: Secondary | ICD-10-CM

## 2011-03-07 DIAGNOSIS — M533 Sacrococcygeal disorders, not elsewhere classified: Secondary | ICD-10-CM | POA: Insufficient documentation

## 2011-03-07 DIAGNOSIS — E1142 Type 2 diabetes mellitus with diabetic polyneuropathy: Secondary | ICD-10-CM

## 2011-03-08 ENCOUNTER — Ambulatory Visit (INDEPENDENT_AMBULATORY_CARE_PROVIDER_SITE_OTHER): Payer: Medicare Other | Admitting: *Deleted

## 2011-03-08 DIAGNOSIS — I4891 Unspecified atrial fibrillation: Secondary | ICD-10-CM

## 2011-03-08 DIAGNOSIS — Z7901 Long term (current) use of anticoagulants: Secondary | ICD-10-CM

## 2011-03-10 ENCOUNTER — Other Ambulatory Visit: Payer: Self-pay | Admitting: Cardiology

## 2011-03-17 ENCOUNTER — Other Ambulatory Visit: Payer: Self-pay | Admitting: Internal Medicine

## 2011-03-22 ENCOUNTER — Ambulatory Visit: Payer: Medicare Other | Attending: Physical Medicine & Rehabilitation | Admitting: Physical Therapy

## 2011-03-22 DIAGNOSIS — R262 Difficulty in walking, not elsewhere classified: Secondary | ICD-10-CM | POA: Insufficient documentation

## 2011-03-22 DIAGNOSIS — R5381 Other malaise: Secondary | ICD-10-CM | POA: Insufficient documentation

## 2011-03-22 DIAGNOSIS — IMO0001 Reserved for inherently not codable concepts without codable children: Secondary | ICD-10-CM | POA: Insufficient documentation

## 2011-03-24 ENCOUNTER — Other Ambulatory Visit: Payer: Self-pay | Admitting: Cardiology

## 2011-03-27 ENCOUNTER — Ambulatory Visit: Payer: Medicare Other | Admitting: Physical Therapy

## 2011-03-30 ENCOUNTER — Ambulatory Visit: Payer: Medicare Other | Admitting: Physical Therapy

## 2011-03-30 LAB — DIFFERENTIAL
Basophils Absolute: 0
Basophils Relative: 0
Eosinophils Absolute: 0.1
Eosinophils Relative: 1
Lymphocytes Relative: 25
Lymphs Abs: 1.7
Monocytes Absolute: 0.7
Monocytes Relative: 10
Neutro Abs: 4.3
Neutrophils Relative %: 63

## 2011-03-30 LAB — CBC
HCT: 36.4 — ABNORMAL LOW
Hemoglobin: 12.5 — ABNORMAL LOW
MCHC: 34.3
MCV: 91.3
Platelets: 146 — ABNORMAL LOW
RBC: 3.99 — ABNORMAL LOW
RDW: 14.1
WBC: 6.8

## 2011-03-30 LAB — PROTIME-INR
INR: 1.6 — ABNORMAL HIGH
INR: 1.7 — ABNORMAL HIGH
Prothrombin Time: 20.2 — ABNORMAL HIGH
Prothrombin Time: 20.8 — ABNORMAL HIGH

## 2011-03-30 LAB — BASIC METABOLIC PANEL
BUN: 16
CO2: 27
Calcium: 9
Chloride: 107
Creatinine, Ser: 0.78
GFR calc Af Amer: >60
GFR calc non Af Amer: >60
Glucose, Bld: 133 — ABNORMAL HIGH
Potassium: 3.6
Sodium: 141

## 2011-04-03 ENCOUNTER — Other Ambulatory Visit (HOSPITAL_COMMUNITY): Payer: Self-pay | Admitting: Orthopedic Surgery

## 2011-04-03 ENCOUNTER — Encounter (HOSPITAL_COMMUNITY)
Admission: RE | Admit: 2011-04-03 | Discharge: 2011-04-03 | Disposition: A | Discharge status: Home / Self Care | Payer: Medicare Other | Source: Ambulatory Visit | Attending: Orthopedic Surgery | Admitting: Orthopedic Surgery

## 2011-04-03 ENCOUNTER — Telehealth: Payer: Self-pay | Admitting: Cardiology

## 2011-04-03 ENCOUNTER — Ambulatory Visit (INDEPENDENT_AMBULATORY_CARE_PROVIDER_SITE_OTHER): Payer: Medicare Other | Admitting: *Deleted

## 2011-04-03 DIAGNOSIS — M205X9 Other deformities of toe(s) (acquired), unspecified foot: Secondary | ICD-10-CM

## 2011-04-03 DIAGNOSIS — Z7901 Long term (current) use of anticoagulants: Secondary | ICD-10-CM

## 2011-04-03 DIAGNOSIS — I4891 Unspecified atrial fibrillation: Secondary | ICD-10-CM

## 2011-04-03 LAB — CBC
HCT: 36 % — ABNORMAL LOW (ref 39.0–52.0)
Hemoglobin: 12.2 g/dL — ABNORMAL LOW (ref 13.0–17.0)
MCH: 30.4 pg (ref 26.0–34.0)
MCHC: 33.9 g/dL (ref 30.0–36.0)

## 2011-04-03 LAB — BASIC METABOLIC PANEL
BUN: 24 mg/dL — ABNORMAL HIGH (ref 6–23)
Chloride: 103 mEq/L (ref 96–112)
GFR calc Af Amer: >90 mL/min (ref >90)
Glucose, Bld: 126 mg/dL — ABNORMAL HIGH (ref 70–99)
Potassium: 4.5 mEq/L (ref 3.5–5.1)

## 2011-04-03 LAB — PROTIME-INR: INR: 2.81 — ABNORMAL HIGH (ref 0.00–1.49)

## 2011-04-03 LAB — POCT INR: INR: 2.9

## 2011-04-03 NOTE — Telephone Encounter (Signed)
[place] faxed to Kristy/[facility] @ [phone]  04/03/11/km

## 2011-04-04 ENCOUNTER — Ambulatory Visit (HOSPITAL_COMMUNITY)
Admission: RE | Admit: 2011-04-04 | Discharge: 2011-04-04 | Disposition: A | Discharge status: Home / Self Care | Payer: Medicare Other | Source: Ambulatory Visit | Attending: Orthopedic Surgery | Admitting: Orthopedic Surgery

## 2011-04-04 ENCOUNTER — Ambulatory Visit: Payer: Medicare Other | Admitting: Physical Medicine & Rehabilitation

## 2011-04-04 DIAGNOSIS — I872 Venous insufficiency (chronic) (peripheral): Secondary | ICD-10-CM | POA: Insufficient documentation

## 2011-04-04 DIAGNOSIS — G4733 Obstructive sleep apnea (adult) (pediatric): Secondary | ICD-10-CM | POA: Insufficient documentation

## 2011-04-04 DIAGNOSIS — G5790 Unspecified mononeuropathy of unspecified lower limb: Secondary | ICD-10-CM | POA: Insufficient documentation

## 2011-04-04 DIAGNOSIS — L97509 Non-pressure chronic ulcer of other part of unspecified foot with unspecified severity: Secondary | ICD-10-CM | POA: Insufficient documentation

## 2011-04-04 DIAGNOSIS — Q667 Congenital pes cavus, unspecified foot: Secondary | ICD-10-CM | POA: Insufficient documentation

## 2011-04-04 DIAGNOSIS — I10 Essential (primary) hypertension: Secondary | ICD-10-CM | POA: Insufficient documentation

## 2011-04-04 DIAGNOSIS — Z01818 Encounter for other preprocedural examination: Secondary | ICD-10-CM | POA: Insufficient documentation

## 2011-04-04 LAB — PROTIME-INR: Prothrombin Time: 28.2 seconds — ABNORMAL HIGH (ref 11.6–15.2)

## 2011-04-05 ENCOUNTER — Encounter: Payer: Medicare Other | Admitting: *Deleted

## 2011-04-07 ENCOUNTER — Other Ambulatory Visit: Payer: Self-pay | Admitting: Cardiology

## 2011-04-11 ENCOUNTER — Ambulatory Visit: Payer: Medicare Other | Admitting: Physical Medicine & Rehabilitation

## 2011-04-19 NOTE — Op Note (Signed)
NAME:  Tim [name], BRANDIS NO.:  [id number]  MEDICAL RECORD NO.:  [id number]  LOCATION:  [place]                         FACILITY:  [facility]  PHYSICIAN:  [name], MD     DATE OF BIRTH:  [birth year]  DATE OF PROCEDURE:  04/04/2011 DATE OF DISCHARGE:  04/04/2011                              OPERATIVE REPORT   PREOPERATIVE DIAGNOSIS:  [name] grade 1 ulcer, insensate neuropathy with fixed clawing of the right great toe with a valgus deformity.  POSTOPERATIVE DIAGNOSIS:  Wagner grade 1 ulcer, insensate neuropathy with fixed clawing of the right great toe with a valgus deformity.  PROCEDURE: 1. Plantar fascial release to the great toe. 2. Fusion of the interphalangeal joint will be fixed clawing of the     right great toe. 3. Debridement of ulcer.  SURGEON:  [name], MD  ANESTHESIA:  General.  ESTIMATED BLOOD LOSS:  Minimal.  ANTIBIOTICS:  Kefzol 2 grams.  DRAINS:  None.  COMPLICATIONS:  None.  TOURNIQUET TIME:  None.  DISPOSITION:  To PACU in stable condition.  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  Due to the deformity and chronic ulceration, the patient wishes to proceed with fusion surgery to minimize his risk of potential amputation of the right great toe.  Risks and benefits were discussed including infection, neurovascular injury, persistent pain, persistent ulceration, and risk for amputation.  The patient states he understands and wished to proceed at this time.  DESCRIPTION OF PROCEDURE:  The patient was brought to OR room 10 and underwent a general anesthetic.  After adequate level of anesthesia was obtained, the patient's right lower extremity was prepped using DuraPrep and draped into a sterile field.  A medial  longitudinal incision was made from the base of the first metatarsal to the middle aspect of the distal phalanx of the great toe.  A valgus osteotomy was performed in an opening wedge to straighten the toe.  Articular cartilage was removed from both the proximal and distal phalanx of the IP joint.  The toe was straightened.  An incision was made proximally over the plantar fascia of the great toe and the plantar fascia was released to allow for dorsiflexion of the great toe.  Two 0.0625 K-wires were used to stabilize the fusion.  C-arm fluoroscopy verified reduction in both AP and lateral planes.  The wounds were irrigated with normal saline and closed with 2-0 nylon.  The wounds were covered with Adaptic orthopedic sponges, ABD dressing, Webril, Kerlix, and Coban.  The patient had a massive venous stasis swelling to the leg and a compressive wrap was applied to the right leg.  We will plan to followup with him in the office in 2 weeks, plan to discharge to home, and prescription for Percocet 10 mg tablets for pain.     [name], MD   MVD/MEDQ  D:  04/04/2011  T:  04/04/2011  Job:  [id number]  Electronically Signed by Beverely Low  Valerian Jewel MD on 04/19/2011 06:26:33 AM

## 2011-04-20 ENCOUNTER — Ambulatory Visit: Payer: Medicare Other | Admitting: Physical Medicine & Rehabilitation

## 2011-04-26 ENCOUNTER — Ambulatory Visit (INDEPENDENT_AMBULATORY_CARE_PROVIDER_SITE_OTHER): Payer: Medicare Other | Admitting: *Deleted

## 2011-04-26 DIAGNOSIS — Z7901 Long term (current) use of anticoagulants: Secondary | ICD-10-CM

## 2011-04-26 DIAGNOSIS — I4891 Unspecified atrial fibrillation: Secondary | ICD-10-CM

## 2011-04-28 ENCOUNTER — Other Ambulatory Visit: Payer: Self-pay | Admitting: Internal Medicine

## 2011-05-02 ENCOUNTER — Encounter: Payer: Medicare Other | Attending: Physical Medicine & Rehabilitation

## 2011-05-02 ENCOUNTER — Ambulatory Visit: Payer: Medicare Other | Admitting: Physical Medicine & Rehabilitation

## 2011-05-02 DIAGNOSIS — M533 Sacrococcygeal disorders, not elsewhere classified: Secondary | ICD-10-CM | POA: Insufficient documentation

## 2011-05-02 DIAGNOSIS — IMO0002 Reserved for concepts with insufficient information to code with codable children: Secondary | ICD-10-CM

## 2011-05-02 DIAGNOSIS — G609 Hereditary and idiopathic neuropathy, unspecified: Secondary | ICD-10-CM

## 2011-05-02 NOTE — Assessment & Plan Note (Signed)
REASON FOR VISIT:  Back pain and lower extremity pain.  HISTORY: Tim [name] returns today, he has a history of a hereditary peripheral neuropathy as well as lumbar spondylosis.  He has responded x1 to S1 transforaminal injection, however, subsequent repeats were not successful.  He has done some type core strengthening exercises, which have been helpful once he gets through the initial discomfort.  He has had no new interval medical history, other than coming out of the cast shoe tomorrow per Tim [name].  He has had no other new medical problems in the interval time.  His pain level currently is 4 up to a 6 at night mainly in the left lower extremity. Numbness, tingling ,weakness are on his review of systems.  PHYSICAL EXAMINATION:  VITAL SIGNS:  Blood pressure 165/68, pulse 80, respirations 16, O2 saturation 96% on room air. GENERAL:  No acute distress, mood affect appropriate.  His back has mild tenderness to palpation in the lumbar paraspinals this is just lateral to the erector spinae muscle group bilaterally, more in the region of the quadratus lumborum muscle.  Straight leg raising test is negative.  He has good quad strength.  His gait shows no evidence toe drag or knee instability.  Mood and affect are appropriate.  IMPRESSION: 1. There is a hereditary neuropathy with nocturnal pain primarily. 2. Lumbar degenerative disk with spondylosis.  PLAN: 1. We will continue his Ultracet 1 p.o. t.i.d.  He will continue     hydrocodone 10 mg at bedtime.  He will continue gabapentin 400 mg     t.i.d. 2. Discussed with the patient, agrees with plan.  He will see my mid-     level provider in 3 months and see me back in 6 months.  He will     resume his balance therapy.     Charlett Blake, M.D. Electronically Signed    AEK/MedQ D:  05/02/2011 14:45:58  T:  05/02/2011 15:41:46  Job #:  [id number]

## 2011-05-03 ENCOUNTER — Other Ambulatory Visit: Payer: Self-pay | Admitting: Internal Medicine

## 2011-05-17 ENCOUNTER — Ambulatory Visit (INDEPENDENT_AMBULATORY_CARE_PROVIDER_SITE_OTHER): Payer: Medicare Other | Admitting: *Deleted

## 2011-05-17 DIAGNOSIS — I4891 Unspecified atrial fibrillation: Secondary | ICD-10-CM

## 2011-05-17 DIAGNOSIS — Z7901 Long term (current) use of anticoagulants: Secondary | ICD-10-CM

## 2011-05-17 LAB — POCT INR: INR: 2.1

## 2011-06-14 ENCOUNTER — Ambulatory Visit (INDEPENDENT_AMBULATORY_CARE_PROVIDER_SITE_OTHER): Payer: Medicare Other | Admitting: *Deleted

## 2011-06-14 DIAGNOSIS — Z7901 Long term (current) use of anticoagulants: Secondary | ICD-10-CM

## 2011-06-14 DIAGNOSIS — I4891 Unspecified atrial fibrillation: Secondary | ICD-10-CM

## 2011-07-12 ENCOUNTER — Encounter: Payer: Medicare Other | Admitting: *Deleted

## 2011-07-19 ENCOUNTER — Encounter: Payer: Medicare Other | Attending: Neurosurgery | Admitting: Neurosurgery

## 2011-07-19 ENCOUNTER — Ambulatory Visit (INDEPENDENT_AMBULATORY_CARE_PROVIDER_SITE_OTHER): Payer: Medicare Other | Admitting: *Deleted

## 2011-07-19 DIAGNOSIS — M545 Low back pain, unspecified: Secondary | ICD-10-CM

## 2011-07-19 DIAGNOSIS — Z7901 Long term (current) use of anticoagulants: Secondary | ICD-10-CM

## 2011-07-19 DIAGNOSIS — I4891 Unspecified atrial fibrillation: Secondary | ICD-10-CM

## 2011-07-19 DIAGNOSIS — M51379 Other intervertebral disc degeneration, lumbosacral region without mention of lumbar back pain or lower extremity pain: Secondary | ICD-10-CM | POA: Insufficient documentation

## 2011-07-19 DIAGNOSIS — M47817 Spondylosis without myelopathy or radiculopathy, lumbosacral region: Secondary | ICD-10-CM

## 2011-07-19 DIAGNOSIS — G608 Other hereditary and idiopathic neuropathies: Secondary | ICD-10-CM | POA: Insufficient documentation

## 2011-07-19 DIAGNOSIS — M5137 Other intervertebral disc degeneration, lumbosacral region: Secondary | ICD-10-CM | POA: Insufficient documentation

## 2011-07-20 NOTE — Assessment & Plan Note (Signed)
HISTORY OF PRESENT ILLNESS:  This is a patient of Dr. Letta Pate, seen for hereditary peripheral neuropathy as well as lumbar spondylosis.  He reports no change in his pain.  He rates it is 2-7.  It is a sharp, dull pain.  General activity level is an 8.  Pain is worse at night.  Sleep patterns are fair.  Walking, bending, standing aggravate.  Rest and medication help.  He walks without assistance.  He uses a cane.  He does climb steps and drive.  Functionally, he is not employed.  Needs help with household duties.  REVIEW OF SYSTEMS:  Notable for difficulties described above as well as some fever, chills, weakness, trouble walking.  No suicidal thoughts or aberrant behaviors.  PAST MEDICAL HISTORY/SOCIAL HISTORY/FAMILY HISTORY:  Unchanged.  PHYSICAL EXAMINATION:  VITAL SIGNS:  His blood pressure is 148/67, pulse 73, respirations 16, O2 sats 96 on room air. NEUROLOGIC:  His motor strength and sensation is intact, although it is diminished in lower extremities.  Constitutionally, he is morbidly obese.  He is alert and oriented x3.  Affect is irritable and gait is unsteady with a cane.  IMPRESSION: 1. Hereditary neuropathy. 2. Lumbar degenerative disk disease, spondylosis.  PLAN:  He will continue his medications as prescribed.  No prescriptions were given today.  His questions were encouraged and answered.  He will come back to clinic in 3 months.     Tim [name] L. Tim [name] Electronically Signed    RLW/MedQ D:  07/19/2011 14:32:37  T:  07/20/2011 06:14:43  Job #:  [id number]

## 2011-08-03 ENCOUNTER — Other Ambulatory Visit: Payer: Self-pay

## 2011-08-03 MED ORDER — LOSARTAN POTASSIUM 50 MG PO TABS: 50.0000 mg | ORAL_TABLET | Freq: Two times a day (BID) | ORAL | Status: DC

## 2011-08-04 ENCOUNTER — Other Ambulatory Visit: Payer: Self-pay | Admitting: Orthopedic Surgery

## 2011-08-04 DIAGNOSIS — M25511 Pain in right shoulder: Secondary | ICD-10-CM

## 2011-08-08 ENCOUNTER — Telehealth: Payer: Self-pay

## 2011-08-08 ENCOUNTER — Other Ambulatory Visit: Payer: Self-pay | Admitting: *Deleted

## 2011-08-08 MED ORDER — ALLOPURINOL 300 MG PO TABS: 300.0000 mg | ORAL_TABLET | Freq: Every day | ORAL | Status: DC

## 2011-08-08 MED ORDER — FINASTERIDE 5 MG PO TABS: 5.0000 mg | ORAL_TABLET | Freq: Every day | ORAL | Status: DC

## 2011-08-08 NOTE — Telephone Encounter (Signed)
Refill request

## 2011-08-09 ENCOUNTER — Ambulatory Visit
Admission: RE | Admit: 2011-08-09 | Discharge: 2011-08-09 | Disposition: A | Discharge status: Home / Self Care | Payer: Medicare Other | Source: Ambulatory Visit | Attending: Orthopedic Surgery | Admitting: Orthopedic Surgery

## 2011-08-09 DIAGNOSIS — M25511 Pain in right shoulder: Secondary | ICD-10-CM

## 2011-08-13 ENCOUNTER — Ambulatory Visit
Admission: RE | Admit: 2011-08-13 | Discharge: 2011-08-13 | Disposition: A | Discharge status: Home / Self Care | Payer: Medicare Other | Source: Ambulatory Visit | Attending: Orthopedic Surgery | Admitting: Orthopedic Surgery

## 2011-08-16 ENCOUNTER — Ambulatory Visit (INDEPENDENT_AMBULATORY_CARE_PROVIDER_SITE_OTHER): Payer: Medicare Other | Admitting: *Deleted

## 2011-08-16 DIAGNOSIS — Z7901 Long term (current) use of anticoagulants: Secondary | ICD-10-CM

## 2011-08-16 DIAGNOSIS — I4891 Unspecified atrial fibrillation: Secondary | ICD-10-CM

## 2011-08-22 ENCOUNTER — Other Ambulatory Visit (HOSPITAL_COMMUNITY): Payer: Self-pay | Admitting: Orthopedic Surgery

## 2011-08-22 ENCOUNTER — Encounter (HOSPITAL_COMMUNITY): Payer: Self-pay | Admitting: Pharmacy Technician

## 2011-08-25 ENCOUNTER — Encounter (HOSPITAL_COMMUNITY): Payer: Self-pay

## 2011-08-25 ENCOUNTER — Ambulatory Visit (HOSPITAL_COMMUNITY)
Admission: RE | Admit: 2011-08-25 | Discharge: 2011-08-25 | Disposition: A | Discharge status: Home / Self Care | Payer: Medicare Other | Source: Ambulatory Visit | Attending: Anesthesiology | Admitting: Anesthesiology

## 2011-08-25 ENCOUNTER — Ambulatory Visit (HOSPITAL_COMMUNITY): Admission: RE | Admit: 2011-08-25 | Payer: Medicare Other | Source: Ambulatory Visit

## 2011-08-25 ENCOUNTER — Encounter (HOSPITAL_COMMUNITY)
Admission: RE | Admit: 2011-08-25 | Discharge: 2011-08-25 | Disposition: A | Discharge status: Home / Self Care | Payer: Medicare Other | Source: Ambulatory Visit | Attending: Orthopedic Surgery | Admitting: Orthopedic Surgery

## 2011-08-25 ENCOUNTER — Other Ambulatory Visit: Payer: Self-pay

## 2011-08-25 DIAGNOSIS — Z01818 Encounter for other preprocedural examination: Secondary | ICD-10-CM | POA: Insufficient documentation

## 2011-08-25 DIAGNOSIS — Z01812 Encounter for preprocedural laboratory examination: Secondary | ICD-10-CM | POA: Insufficient documentation

## 2011-08-25 DIAGNOSIS — I10 Essential (primary) hypertension: Secondary | ICD-10-CM | POA: Insufficient documentation

## 2011-08-25 DIAGNOSIS — Z01810 Encounter for preprocedural cardiovascular examination: Secondary | ICD-10-CM | POA: Insufficient documentation

## 2011-08-25 LAB — COMPREHENSIVE METABOLIC PANEL
ALT: 18 U/L (ref 0–53)
Alkaline Phosphatase: 57 U/L (ref 39–117)
CO2: 29 mEq/L (ref 19–32)
Chloride: 106 mEq/L (ref 96–112)
GFR calc Af Amer: >90 mL/min (ref >90)
Glucose, Bld: 128 mg/dL — ABNORMAL HIGH (ref 70–99)
Potassium: 3.8 mEq/L (ref 3.5–5.1)
Sodium: 142 mEq/L (ref 135–145)
Total Protein: 6.8 g/dL (ref 6.0–8.3)

## 2011-08-25 LAB — CBC
Hemoglobin: 12.7 g/dL — ABNORMAL LOW (ref 13.0–17.0)
MCHC: 34.7 g/dL (ref 30.0–36.0)
RBC: 4.15 MIL/uL — ABNORMAL LOW (ref 4.22–5.81)
WBC: 6.1 10*3/uL (ref 4.0–10.5)

## 2011-08-25 LAB — SURGICAL PCR SCREEN
MRSA, PCR: NEGATIVE
Staphylococcus aureus: NEGATIVE

## 2011-08-25 NOTE — Pre-Procedure Instructions (Addendum)
54 [address]  08/25/2011   Your procedure is scheduled on:  Wednesday 08/30/11   Report to [place] at 1050 AM.  Call this number if you have problems the morning of surgery: [phone]   Remember:   Do not eat food:After Midnight.  May have clear liquids: up to 4 Hours before arrival.  Clear liquids include soda, tea, black coffee, apple or grape juice, broth.  Take these medicines the morning of surgery with A SIP OF WATER: albuterol, allopurinol, proscar, flonase, advair,neurontin, pain pill  , metoprolol, spiriva    Do not wear jewelry, make-up or nail polish.  Do not wear lotions, powders, or perfumes. You may wear deodorant.  Do not shave 48 hours prior to surgery.  Do not bring valuables to the hospital.  Contacts, dentures or bridgework may not be worn into surgery.  Leave suitcase in the car. After surgery it may be brought to your room.  For patients admitted to the hospital, checkout time is 11:00 AM the day of discharge.   Patients discharged the day of surgery will not be allowed to drive home.  Name and phone number of your driver: Lelan Pons  [phone]  Special Instructions: CHG Shower Use Special Wash: 1/2 bottle night before surgery and 1/2 bottle morning of surgery.   Please read over the following fact sheets that you were given: Pain Booklet, MRSA Information and Surgical Site Infection Prevention

## 2011-08-25 NOTE — Progress Notes (Signed)
Pt stated during PAT instructed "not to discontinue coumadin"

## 2011-08-28 NOTE — Progress Notes (Signed)
PT, PTT to be done DOS, pt still taking coumadin

## 2011-08-29 MED ORDER — CEFAZOLIN SODIUM-DEXTROSE 2-3 GM-% IV SOLR
2.0000 g | INTRAVENOUS | Status: AC
  Administered 2011-08-30: 2 g via INTRAVENOUS
  Filled 2011-08-29: qty 50

## 2011-08-30 ENCOUNTER — Other Ambulatory Visit: Payer: Self-pay | Admitting: Internal Medicine

## 2011-08-30 ENCOUNTER — Other Ambulatory Visit: Payer: Self-pay | Admitting: Physical Medicine & Rehabilitation

## 2011-08-30 ENCOUNTER — Ambulatory Visit (HOSPITAL_COMMUNITY): Payer: Medicare Other | Admitting: Certified Registered"

## 2011-08-30 ENCOUNTER — Encounter (HOSPITAL_COMMUNITY): Payer: Self-pay | Admitting: Certified Registered"

## 2011-08-30 ENCOUNTER — Other Ambulatory Visit: Payer: Self-pay | Admitting: *Deleted

## 2011-08-30 ENCOUNTER — Encounter (HOSPITAL_COMMUNITY)
Admission: RE | Disposition: A | Discharge status: Home / Self Care | Payer: Self-pay | Source: Ambulatory Visit | Attending: Orthopedic Surgery

## 2011-08-30 ENCOUNTER — Other Ambulatory Visit: Payer: Self-pay | Admitting: Cardiology

## 2011-08-30 ENCOUNTER — Ambulatory Visit (HOSPITAL_COMMUNITY)
Admission: RE | Admit: 2011-08-30 | Discharge: 2011-08-30 | Disposition: A | Discharge status: Home / Self Care | Payer: Medicare Other | Source: Ambulatory Visit | Attending: Orthopedic Surgery | Admitting: Orthopedic Surgery

## 2011-08-30 ENCOUNTER — Encounter (HOSPITAL_COMMUNITY): Payer: Self-pay | Admitting: *Deleted

## 2011-08-30 DIAGNOSIS — G4733 Obstructive sleep apnea (adult) (pediatric): Secondary | ICD-10-CM | POA: Insufficient documentation

## 2011-08-30 DIAGNOSIS — H409 Unspecified glaucoma: Secondary | ICD-10-CM | POA: Insufficient documentation

## 2011-08-30 DIAGNOSIS — Z79899 Other long term (current) drug therapy: Secondary | ICD-10-CM | POA: Insufficient documentation

## 2011-08-30 DIAGNOSIS — J449 Chronic obstructive pulmonary disease, unspecified: Secondary | ICD-10-CM | POA: Insufficient documentation

## 2011-08-30 DIAGNOSIS — J4489 Other specified chronic obstructive pulmonary disease: Secondary | ICD-10-CM | POA: Insufficient documentation

## 2011-08-30 DIAGNOSIS — M249 Joint derangement, unspecified: Secondary | ICD-10-CM | POA: Insufficient documentation

## 2011-08-30 DIAGNOSIS — E7800 Pure hypercholesterolemia, unspecified: Secondary | ICD-10-CM | POA: Insufficient documentation

## 2011-08-30 DIAGNOSIS — M1990 Unspecified osteoarthritis, unspecified site: Secondary | ICD-10-CM | POA: Insufficient documentation

## 2011-08-30 DIAGNOSIS — I10 Essential (primary) hypertension: Secondary | ICD-10-CM | POA: Insufficient documentation

## 2011-08-30 DIAGNOSIS — I872 Venous insufficiency (chronic) (peripheral): Secondary | ICD-10-CM | POA: Insufficient documentation

## 2011-08-30 DIAGNOSIS — M67919 Unspecified disorder of synovium and tendon, unspecified shoulder: Secondary | ICD-10-CM | POA: Insufficient documentation

## 2011-08-30 DIAGNOSIS — E669 Obesity, unspecified: Secondary | ICD-10-CM | POA: Insufficient documentation

## 2011-08-30 DIAGNOSIS — I4891 Unspecified atrial fibrillation: Secondary | ICD-10-CM | POA: Insufficient documentation

## 2011-08-30 DIAGNOSIS — M109 Gout, unspecified: Secondary | ICD-10-CM | POA: Insufficient documentation

## 2011-08-30 DIAGNOSIS — M719 Bursopathy, unspecified: Secondary | ICD-10-CM | POA: Insufficient documentation

## 2011-08-30 DIAGNOSIS — X58XXXA Exposure to other specified factors, initial encounter: Secondary | ICD-10-CM | POA: Insufficient documentation

## 2011-08-30 DIAGNOSIS — S43439A Superior glenoid labrum lesion of unspecified shoulder, initial encounter: Secondary | ICD-10-CM | POA: Insufficient documentation

## 2011-08-30 DIAGNOSIS — M47817 Spondylosis without myelopathy or radiculopathy, lumbosacral region: Secondary | ICD-10-CM | POA: Insufficient documentation

## 2011-08-30 DIAGNOSIS — G609 Hereditary and idiopathic neuropathy, unspecified: Secondary | ICD-10-CM | POA: Insufficient documentation

## 2011-08-30 HISTORY — PX: SHOULDER ARTHROSCOPY: SHX128

## 2011-08-30 LAB — APTT: aPTT: 38 seconds — ABNORMAL HIGH (ref 24–37)

## 2011-08-30 SURGERY — ARTHROSCOPY, SHOULDER
Anesthesia: General | Site: Shoulder | Laterality: Right | Wound class: Clean

## 2011-08-30 MED ORDER — LACTATED RINGERS IV SOLN
INTRAVENOUS | Status: DC
  Administered 2011-08-30: 12:00:00 via INTRAVENOUS

## 2011-08-30 MED ORDER — BUPIVACAINE HCL (PF) 0.25 % IJ SOLN
INTRAMUSCULAR | Status: DC | PRN
  Administered 2011-08-30: 30 mL

## 2011-08-30 MED ORDER — PROMETHAZINE HCL 25 MG/ML IJ SOLN: 6.2500 mg | INTRAMUSCULAR | Status: DC | PRN

## 2011-08-30 MED ORDER — FENTANYL CITRATE 0.05 MG/ML IJ SOLN
INTRAMUSCULAR | Status: DC | PRN
  Administered 2011-08-30: 50 ug via INTRAVENOUS
  Administered 2011-08-30: 100 ug via INTRAVENOUS

## 2011-08-30 MED ORDER — HYDROMORPHONE HCL PF 1 MG/ML IJ SOLN
INTRAMUSCULAR | Status: AC
  Filled 2011-08-30: qty 1

## 2011-08-30 MED ORDER — OXYCODONE-ACETAMINOPHEN 5-325 MG PO TABS
ORAL_TABLET | ORAL | Status: AC
  Filled 2011-08-30: qty 2

## 2011-08-30 MED ORDER — LIDOCAINE HCL (CARDIAC) 20 MG/ML IV SOLN
INTRAVENOUS | Status: DC | PRN
  Administered 2011-08-30: 20 mg via INTRAVENOUS

## 2011-08-30 MED ORDER — ONDANSETRON HCL 4 MG/2ML IJ SOLN
INTRAMUSCULAR | Status: AC
  Filled 2011-08-30: qty 2

## 2011-08-30 MED ORDER — LACTATED RINGERS IV SOLN
INTRAVENOUS | Status: DC | PRN
  Administered 2011-08-30 (×2): via INTRAVENOUS

## 2011-08-30 MED ORDER — PROPOFOL 10 MG/ML IV EMUL
INTRAVENOUS | Status: DC | PRN
  Administered 2011-08-30: 200 mg via INTRAVENOUS

## 2011-08-30 MED ORDER — MIDAZOLAM HCL 2 MG/2ML IJ SOLN
INTRAMUSCULAR | Status: AC
  Filled 2011-08-30: qty 2

## 2011-08-30 MED ORDER — SODIUM CHLORIDE 0.9 % IR SOLN
Status: DC | PRN
  Administered 2011-08-30: 6000 mL

## 2011-08-30 MED ORDER — HYDROMORPHONE HCL PF 1 MG/ML IJ SOLN
0.2500 mg | INTRAMUSCULAR | Status: DC | PRN
  Administered 2011-08-30 (×4): 0.5 mg via INTRAVENOUS

## 2011-08-30 MED ORDER — SUCCINYLCHOLINE CHLORIDE 20 MG/ML IJ SOLN
INTRAMUSCULAR | Status: DC | PRN
  Administered 2011-08-30: 100 mg via INTRAVENOUS

## 2011-08-30 MED ORDER — OXYCODONE-ACETAMINOPHEN 5-325 MG PO TABS
2.0000 | ORAL_TABLET | Freq: Once | ORAL | Status: AC
  Administered 2011-08-30: 2 via ORAL

## 2011-08-30 MED ORDER — LOSARTAN POTASSIUM 50 MG PO TABS: 50.0000 mg | ORAL_TABLET | Freq: Two times a day (BID) | ORAL | Status: DC

## 2011-08-30 MED ORDER — MEPERIDINE HCL 25 MG/ML IJ SOLN: 6.2500 mg | INTRAMUSCULAR | Status: DC | PRN

## 2011-08-30 MED ORDER — MIDAZOLAM HCL 5 MG/5ML IJ SOLN
INTRAMUSCULAR | Status: DC | PRN
  Administered 2011-08-30: 2 mg via INTRAVENOUS

## 2011-08-30 MED ORDER — FENTANYL CITRATE 0.05 MG/ML IJ SOLN
INTRAMUSCULAR | Status: AC
  Filled 2011-08-30: qty 2

## 2011-08-30 SURGICAL SUPPLY — 34 items
BLADE GREAT WHITE 4.2 (BLADE) ×2 IMPLANT
BUR OVAL 6.0 (BURR) ×2 IMPLANT
CANNULA SHOULDER 7CM (CANNULA) ×2 IMPLANT
CLOTH BEACON ORANGE TIMEOUT ST (SAFETY) ×2 IMPLANT
COVER SURGICAL LIGHT HANDLE (MISCELLANEOUS) ×2 IMPLANT
DRAPE INCISE IOBAN 66X45 STRL (DRAPES) ×1 IMPLANT
DRAPE STERI 35X30 U-POUCH (DRAPES) ×2 IMPLANT
DRAPE U-SHAPE 47X51 STRL (DRAPES) ×2 IMPLANT
DRSG EMULSION OIL 3X3 NADH (GAUZE/BANDAGES/DRESSINGS) ×2 IMPLANT
DRSG PAD ABDOMINAL 8X10 ST (GAUZE/BANDAGES/DRESSINGS) ×4 IMPLANT
DURAPREP 26ML APPLICATOR (WOUND CARE) ×2 IMPLANT
GLOVE BIOGEL PI IND STRL 9 (GLOVE) ×1 IMPLANT
GLOVE BIOGEL PI INDICATOR 9 (GLOVE) ×1
GLOVE SURG ORTHO 9.0 STRL STRW (GLOVE) ×2 IMPLANT
GOWN PREVENTION PLUS XLARGE (GOWN DISPOSABLE) ×2 IMPLANT
GOWN SRG XL XLNG 56XLVL 4 (GOWN DISPOSABLE) ×1 IMPLANT
GOWN STRL NON-REIN XL XLG LVL4 (GOWN DISPOSABLE) ×2
KIT BASIN OR (CUSTOM PROCEDURE TRAY) ×2 IMPLANT
KIT ROOM TURNOVER OR (KITS) ×2 IMPLANT
MANIFOLD NEPTUNE II (INSTRUMENTS) ×2 IMPLANT
NDL SPNL 18GX3.5 QUINCKE PK (NEEDLE) ×1 IMPLANT
NEEDLE SPNL 18GX3.5 QUINCKE PK (NEEDLE) ×2 IMPLANT
NS IRRIG 1000ML POUR BTL (IV SOLUTION) ×2 IMPLANT
PACK SHOULDER (CUSTOM PROCEDURE TRAY) ×2 IMPLANT
PAD ARMBOARD 7.5X6 YLW CONV (MISCELLANEOUS) ×4 IMPLANT
SET ARTHROSCOPY TUBING (MISCELLANEOUS) ×2
SET ARTHROSCOPY TUBING LN (MISCELLANEOUS) ×1 IMPLANT
SLING ARM IMMOBILIZER XL (CAST SUPPLIES) ×2 IMPLANT
SPONGE GAUZE 4X4 12PLY (GAUZE/BANDAGES/DRESSINGS) ×2 IMPLANT
SPONGE LAP 4X18 X RAY DECT (DISPOSABLE) ×2 IMPLANT
SUT ETHILON 2 0 FS 18 (SUTURE) ×2 IMPLANT
TOWEL OR 17X24 6PK STRL BLUE (TOWEL DISPOSABLE) ×4 IMPLANT
WAND 90 DEG TURBOVAC W/CORD (SURGICAL WAND) ×1 IMPLANT
WATER STERILE IRR 1000ML POUR (IV SOLUTION) ×2 IMPLANT

## 2011-08-30 NOTE — Anesthesia Preprocedure Evaluation (Addendum)
Anesthesia Evaluation  Patient identified by MRN, date of birth, ID band Patient awake    Reviewed: Allergy & Precautions, H&P , NPO status , Patient's Chart, lab work & pertinent test results, reviewed documented beta blocker date and time   Airway Mallampati: III TM Distance: >3 FB Neck ROM: Full    Dental No notable dental hx. (+) Teeth Intact   Pulmonary sleep apnea and Continuous Positive Airway Pressure Ventilation , COPD clear to auscultation  Pulmonary exam normal       Cardiovascular hypertension, On Medications and On Home Beta Blockers + dysrhythmias Atrial Fibrillation + Valvular Problems/Murmurs AS Regular Normal+ Systolic murmurs    Neuro/Psych Negative Neurological ROS  Negative Psych ROS   GI/Hepatic Neg liver ROS, GERD-  Medicated and Controlled,  Endo/Other  Morbid obesity  Renal/GU negative Renal ROS  Genitourinary negative   Musculoskeletal   Abdominal (+) obese,   Peds  Hematology negative hematology ROS (+)   Anesthesia Other Findings   Reproductive/Obstetrics negative OB ROS                           Anesthesia Physical Anesthesia Plan  ASA: III  Anesthesia Plan: General   Post-op Pain Management:    Induction: Intravenous  Airway Management Planned: Oral ETT and Video Laryngoscope Planned  Additional Equipment:   Intra-op Plan:   Post-operative Plan: Extubation in OR  Informed Consent: I have reviewed the patients History and Physical, chart, labs and discussed the procedure including the risks, benefits and alternatives for the proposed anesthesia with the patient or authorized representative who has indicated his/her understanding and acceptance.     Plan Discussed with: CRNA and Surgeon  Anesthesia Plan Comments:        Anesthesia Quick Evaluation

## 2011-08-30 NOTE — Transfer of Care (Signed)
Immediate Anesthesia Transfer of Care Note  Patient: Tim [name]  Procedure(s) Performed: Procedure(s) (LRB): ARTHROSCOPY SHOULDER (Right)  Patient Location: PACU  Anesthesia Type: General  Level of Consciousness: awake  Airway & Oxygen Therapy: Patient Spontanous Breathing and Patient connected to nasal cannula oxygen  Post-op Assessment: Report given to PACU RN, Post -op Vital signs reviewed and stable and Patient moving all extremities  Post vital signs: Reviewed and stable  Complications: No apparent anesthesia complications

## 2011-08-30 NOTE — Discharge Instructions (Signed)
Sliding right arm. Okay to use ice on the shoulder. Discontinue dressing in 2 days at which time its okay to shower and get incision wet. Patient is start range of motion exercises tomorrow.

## 2011-08-30 NOTE — Anesthesia Postprocedure Evaluation (Signed)
  Anesthesia Post-op Note  Patient: Tim [name]  Procedure(s) Performed: Procedure(s) (LRB): ARTHROSCOPY SHOULDER (Right)  Patient Location: PACU  Anesthesia Type: General  Level of Consciousness: awake  Airway and Oxygen Therapy: Patient Spontanous Breathing and Patient connected to nasal cannula oxygen  Post-op Pain: mild  Post-op Assessment: Post-op Vital signs reviewed, Patient's Cardiovascular Status Stable, Respiratory Function Stable, Patent Airway and No signs of Nausea or vomiting  Post-op Vital Signs: Reviewed and stable  Complications: No apparent anesthesia complications

## 2011-08-30 NOTE — Progress Notes (Signed)
Pt. Nauseated just prior to d/c.  Sm. Amt of clear emesis.

## 2011-08-30 NOTE — H&P (Signed)
Tim [name] is an 71 y.o. male.   Chief Complaint: Right shoulder pain HPI: Patient is a 71 year old gentleman with chronic right shoulder pain he has pain with activities daily living has failed conservative care and presents at this time for surgical intervention.  Past Medical History  Diagnosis Date  . HYPERCHOLESTEROLEMIA-PURE   . GOUT   . OBSTRUCTIVE SLEEP APNEA   . Essential hypertension, benign   . VENOUS INSUFFICIENCY   . ASTHMA   . COPD   . VENTRAL HERNIA   . OSTEOARTHRITIS   . CATARACT, HX OF   . PULMONARY HYPERTENSION, HX OF   . Hallux valgus (acquired)   . Obesity, unspecified   . Osteoarthrosis, unspecified whether generalized or localized, unspecified site   . Unspecified glaucoma   . Heart murmur   . Atrial fibrillation   . Lumbosacral neuritis   . Unspecified hereditary and idiopathic peripheral neuropathy   . Lumbosacral spondylosis     Past Surgical History  Procedure Date  . Hemorrhoid surgery   . Vitrectomy   . Intraocular lens insertion   . Leg surgery   . Knee surgery   . Traeculectomy     Family History  Problem Relation Age of Onset  . Hypertension Mother   . Coronary artery disease Mother   . Heart attack Mother   . Pulmonary fibrosis Father    Social History:  reports that he has never smoked. He does not have any smokeless tobacco history on file. He reports that he uses illicit drugs (Oxycodone). He reports that he does not drink alcohol.  Allergies:  Allergies  Allergen Reactions  . Brimonidine Tartrate     REACTION: sob  . Brinzolamide     REACTION: sob  . Celecoxib     REACTION: confusion  . Codeine     REACTION: unk  . Colchicine     diarrhea  . Diltiazem     REACTION: leg swelling  . Latanoprost     REACTION: sob  . Rofecoxib     REACTION: leg swelling  . Timolol Maleate     REACTION: asthma exaserbation  . Vancomycin     REACTION: hives/blisters    Medications Prior to Admission  Medication Dose Route  Frequency Provider Last Rate Last Dose  . ceFAZolin (ANCEF) IVPB 2 g/50 mL premix  2 g Intravenous 60 min Pre-Op [name], MD      . lactated ringers infusion   Intravenous Continuous Soledad Gerlach, MD 50 mL/hr at 08/30/11 1205     Medications Prior to Admission  Medication Sig Dispense Refill  . allopurinol (ZYLOPRIM) 300 MG tablet Take 1 tablet (300 mg total) by mouth daily. Need follow up appt  30 tablet  0  . doxycycline (VIBRAMYCIN) 100 MG capsule Take 100 mg by mouth 2 (two) times daily. Take for 7 days. First dose was on 08/28/11      . finasteride (PROSCAR) 5 MG tablet Take 1 tablet (5 mg total) by mouth daily.  30 tablet  0  . furosemide (LASIX) 40 MG tablet take 1 tablet by mouth twice a day  60 tablet  5  . HYDROcodone-acetaminophen (NORCO) 10-325 MG per tablet Take 1 tablet by mouth at bedtime.      . meloxicam (MOBIC) 15 MG tablet TAKE 1 TABLET BY MOUTH DAILY WITH FOOD  90 tablet  3  . Multiple Vitamin (MULTIVITAMIN) tablet Take 1 tablet by mouth daily.        [name]  NIASPAN 1000 MG CR tablet TAKE 1 TABLET BY MOUTH TWICE A DAY  180 tablet  2  . omeprazole (PRILOSEC) 10 MG capsule take 1 capsule by mouth once daily  30 capsule  6  . oxyCODONE (OXYCONTIN) 10 MG 12 hr tablet Take 10 mg by mouth daily as needed. For breakthrough pain      . tiotropium (SPIRIVA) 18 MCG inhalation capsule Place 18 mcg into inhaler and inhale daily.        . vitamin E 200 UNIT capsule Take 200 Units by mouth daily.        [name] ZETIA 10 MG tablet take 1 tablet by mouth once daily  90 tablet  3  . fluticasone (FLONASE) 50 MCG/ACT nasal spray 2 sprays by Nasal route as needed.          Results for orders placed during the hospital encounter of 08/30/11 (from the past 48 hour(s))  PROTIME-INR     Status: Abnormal   Collection Time   08/30/11 10:44 AM      Component Value Range Comment   Prothrombin Time 25.7 (*) 11.6 - 15.2 (seconds)    INR 2.30 (*) 0.00 - 1.49    APTT     Status: Abnormal    Collection Time   08/30/11 10:44 AM      Component Value Range Comment   aPTT 38 (*) 24 - 37 (seconds)    No results found.  Review of Systems  All other systems reviewed and are negative.    Blood pressure 110/50, pulse 66, temperature 98.3 F (36.8 C), temperature source Oral, resp. rate 18, SpO2 94.00%. Physical Exam  On examination patient has pain with Neer and Hawkins impingement test the right shoulder. He has a positive drop arm test. 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  Plan: Plan for right shoulder arthroscopy subacromial decompression and debridement rotator cuff tear debridement SLAP lesion. Risks and benefits were discussed including infection neurovascular injury persistent pain need for additional surgery. Patient states he understands was pursued this time.  Aretha Levi V 08/30/2011, 12:42 PM

## 2011-08-30 NOTE — Preoperative (Signed)
Beta Blockers   Reason not to administer Beta Blockers:Metoprolol at 0900hrs 27 Feb 13

## 2011-08-30 NOTE — Op Note (Signed)
OPERATIVE REPORT  DATE OF SURGERY: 08/30/2011  PATIENT:  Tim [name],  71 y.o. male  PRE-OPERATIVE DIAGNOSIS:  Right shoulder rotator cuff tear  POST-OPERATIVE DIAGNOSIS:  right shoulder rotator cuff tear  PROCEDURE:  Procedure(s): ARTHROSCOPY SHOULDER Subacromial decompression. Debridement rotator cuff tear debridement SLAP lesion partial debridement of biceps tendon tear. Manipulation under anesthesia. Debridement of subscapularis adhesions.  SURGEON:  Surgeon(s): [name], MD  ANESTHESIA:   general  EBL:  Minimal ML  SPECIMEN:  No Specimen  TOURNIQUET:  * No tourniquets in log *  PROCEDURE DETAILS: Patient is a 71 year old gentleman with rotator cuff pathology of his right shoulder he has failed conservative care has pain with activities of daily living pain with trying to sleep at night and presents at this time for right shoulder arthroscopy. Risks and benefits were discussed including infection neurovascular injury persistent pain DVT pulmonary embolus need for additional surgery. Patient states he understands and wished to proceed at this time. Description of procedure patient was brought to or room tendon underwent a general anesthetic. After adequate levels of anesthesia were obtained patient's was placed in the beachchair position and the right upper extremity was prepped using DuraPrep and draped into a sterile field the scope was inserted in the posterior portal and an working portal was established with outside in technique an 18-gauge spinal. Visualization showed partial tearing of the rotator cuff but not no full-thickness tearing. Patient had a SLAP lesion and adhesions his subscapularis he underwent manipulation under anesthesia to take up the adhesions he underwent further debridement of the subscapularis adhesions debridement of the SLAP lesion and debridement of partial-thickness rotator cuff tear. The isthmus removed the scope was then inserted from the  posterior portal the subacromial space and in A portal was established. Visualization showed a type III acromion with significant bursitis. The bursa was resected there was no full-thickness rotator cuff tear he underwent subacromial decompression. The isthmus removed the poles were closed using 2-0 nylon the wounds were covered with Adaptic orthopedic sponges AB dressing and Hypafix tape. Patient was extubated taken to the PACU in stable condition patient requests discharge to home.  PLAN OF CARE: Discharge to home after PACU  PATIENT DISPOSITION:  PACU - hemodynamically stable.   [name], MD 08/30/2011 2:04 PM

## 2011-08-30 NOTE — Progress Notes (Signed)
Nausea has subsided.

## 2011-08-31 ENCOUNTER — Encounter (HOSPITAL_COMMUNITY): Payer: Self-pay | Admitting: Orthopedic Surgery

## 2011-08-31 MED FILL — Morphine Sulfate Inj 4 MG/ML: INTRAMUSCULAR | Qty: 1 | Status: AC

## 2011-09-04 ENCOUNTER — Other Ambulatory Visit: Payer: Self-pay | Admitting: Cardiology

## 2011-09-13 ENCOUNTER — Telehealth: Payer: Self-pay | Admitting: Physical Medicine & Rehabilitation

## 2011-09-13 ENCOUNTER — Other Ambulatory Visit: Payer: Self-pay | Admitting: Physical Medicine & Rehabilitation

## 2011-09-13 NOTE — Telephone Encounter (Signed)
Someone called to approve Norco.  Person calling in did not leave name.  Needs to make sure it was [employer] that called and needs name of person who called to make it legal.

## 2011-09-14 NOTE — Telephone Encounter (Signed)
[name] and left my information with them.

## 2011-09-27 ENCOUNTER — Ambulatory Visit (INDEPENDENT_AMBULATORY_CARE_PROVIDER_SITE_OTHER): Payer: Medicare Other | Admitting: *Deleted

## 2011-09-27 DIAGNOSIS — I4891 Unspecified atrial fibrillation: Secondary | ICD-10-CM

## 2011-09-27 DIAGNOSIS — Z7901 Long term (current) use of anticoagulants: Secondary | ICD-10-CM

## 2011-10-03 ENCOUNTER — Other Ambulatory Visit: Payer: Self-pay | Admitting: *Deleted

## 2011-10-03 MED ORDER — LOSARTAN POTASSIUM 50 MG PO TABS: 50.0000 mg | ORAL_TABLET | Freq: Two times a day (BID) | ORAL | Status: DC

## 2011-10-03 NOTE — Telephone Encounter (Signed)
30-day supply only; overdue for OV

## 2011-10-10 ENCOUNTER — Encounter: Payer: Self-pay | Admitting: Physical Medicine & Rehabilitation

## 2011-10-10 ENCOUNTER — Encounter: Payer: Medicare Other | Attending: Neurosurgery

## 2011-10-10 ENCOUNTER — Ambulatory Visit (HOSPITAL_BASED_OUTPATIENT_CLINIC_OR_DEPARTMENT_OTHER): Payer: Medicare Other | Admitting: Physical Medicine & Rehabilitation

## 2011-10-10 VITALS — BP 150/71 | HR 72 | Resp 16 | Ht 75.0 in | Wt 366.0 lb

## 2011-10-10 DIAGNOSIS — G609 Hereditary and idiopathic neuropathy, unspecified: Secondary | ICD-10-CM | POA: Insufficient documentation

## 2011-10-10 DIAGNOSIS — M5137 Other intervertebral disc degeneration, lumbosacral region: Secondary | ICD-10-CM | POA: Insufficient documentation

## 2011-10-10 DIAGNOSIS — M47817 Spondylosis without myelopathy or radiculopathy, lumbosacral region: Secondary | ICD-10-CM | POA: Insufficient documentation

## 2011-10-10 DIAGNOSIS — G608 Other hereditary and idiopathic neuropathies: Secondary | ICD-10-CM

## 2011-10-10 DIAGNOSIS — M51379 Other intervertebral disc degeneration, lumbosacral region without mention of lumbar back pain or lower extremity pain: Secondary | ICD-10-CM | POA: Insufficient documentation

## 2011-10-10 DIAGNOSIS — IMO0002 Reserved for concepts with insufficient information to code with codable children: Secondary | ICD-10-CM

## 2011-10-10 MED ORDER — HYDROCODONE-ACETAMINOPHEN 7.5-325 MG PO TABS: 2.0000 | ORAL_TABLET | Freq: Every day | ORAL | Status: DC

## 2011-10-10 MED ORDER — TRAMADOL-ACETAMINOPHEN 37.5-325 MG PO TABS: 1.0000 | ORAL_TABLET | Freq: Four times a day (QID) | ORAL | Status: DC | PRN

## 2011-10-10 NOTE — Progress Notes (Signed)
  Subjective:    Patient ID: Tim [name], male    DOB: 05-Nov-1940, 71 y.o.   MRN: [id number]  HPI   Review of Systems     Objective:   Physical Exam        Assessment & Plan:  1. Increase his Ultracet 4 times a day  2. Increase hydrocodone 15 mg at night  3. Return to clinic in 3 months.

## 2011-10-10 NOTE — Patient Instructions (Addendum)
Increase the Ultracet to 4 times a day The hydrocodone dose is changed to 7.5 but you will take 2 tablets at night for a total of 15 mg of hydrocodone

## 2011-10-10 NOTE — Progress Notes (Signed)
  Subjective:    Patient ID: Tim [name], male    DOB: [birth year], 71 y.o.   MRN: [id number]  HPI REASON FOR VISIT: Back pain and lower extremity pain.  HISTORY:  Mr. Chenier returns today, he has a history of a hereditary peripheral  neuropathy as well as lumbar spondylosis. He has responded x1 to S1  transforaminal injection, however, subsequent repeats were not  successful. He has done some type core strengthening exercises, which  have been helpful once he gets through the initial discomfort. The patient has undergone right rotator cuff surgery in February. Pain Inventory Average Pain 7 Pain Right Now 4 My pain is sharp, stabbing and aching  In the last 24 hours, has pain interfered with the following? General activity 6 Relation with others 7 Enjoyment of life 7 What TIME of day is your pain at its worst? Morning and Night Sleep (in general) Fair  Pain is worse with: walking, bending and standing Pain improves with: rest, pacing activities and medication Relief from Meds: 3  Mobility walk without assistance use a cane how many minutes can you walk? 5 ability to climb steps?  yes do you drive?  yes  Function I need assistance with the following:  bathing, toileting, household duties and shopping  Neuro/Psych weakness numbness tingling trouble walking  Prior Studies Any changes since last visit?  no  Physicians involved in your care Any changes since last visit?  no  Review of Systems  Constitutional: Positive for unexpected weight change.  HENT: Negative.   Eyes: Negative.   Respiratory: Positive for apnea and shortness of breath.   Cardiovascular: Negative.   Gastrointestinal: Negative.   Musculoskeletal: Negative.   Skin: Negative.   Neurological: Positive for weakness and numbness.  Hematological: Negative.   Psychiatric/Behavioral: Negative.        Objective:   Physical Exam        Assessment & Plan:

## 2011-10-11 ENCOUNTER — Encounter: Payer: Self-pay | Admitting: Physical Medicine & Rehabilitation

## 2011-10-13 ENCOUNTER — Ambulatory Visit: Payer: Medicare Other | Attending: Orthopedic Surgery | Admitting: Physical Therapy

## 2011-10-13 DIAGNOSIS — M6281 Muscle weakness (generalized): Secondary | ICD-10-CM | POA: Insufficient documentation

## 2011-10-13 DIAGNOSIS — M25519 Pain in unspecified shoulder: Secondary | ICD-10-CM | POA: Insufficient documentation

## 2011-10-13 DIAGNOSIS — IMO0001 Reserved for inherently not codable concepts without codable children: Secondary | ICD-10-CM | POA: Insufficient documentation

## 2011-10-13 DIAGNOSIS — M25619 Stiffness of unspecified shoulder, not elsewhere classified: Secondary | ICD-10-CM | POA: Insufficient documentation

## 2011-10-19 ENCOUNTER — Encounter: Payer: Medicare Other | Admitting: Physical Therapy

## 2011-10-20 ENCOUNTER — Ambulatory Visit: Payer: Medicare Other | Admitting: Physical Therapy

## 2011-10-23 ENCOUNTER — Ambulatory Visit: Payer: Medicare Other | Admitting: Physical Therapy

## 2011-10-26 ENCOUNTER — Ambulatory Visit: Payer: Medicare Other | Admitting: Physical Therapy

## 2011-10-30 ENCOUNTER — Ambulatory Visit: Payer: Medicare Other | Admitting: Physical Therapy

## 2011-10-31 ENCOUNTER — Other Ambulatory Visit: Payer: Self-pay | Admitting: Internal Medicine

## 2011-10-31 ENCOUNTER — Other Ambulatory Visit: Payer: Self-pay | Admitting: Physical Medicine & Rehabilitation

## 2011-11-01 ENCOUNTER — Other Ambulatory Visit: Payer: Self-pay

## 2011-11-01 MED ORDER — METOPROLOL SUCCINATE ER 200 MG PO TB24: 200.0000 mg | ORAL_TABLET | Freq: Two times a day (BID) | ORAL | Status: DC

## 2011-11-01 MED ORDER — LOSARTAN POTASSIUM 50 MG PO TABS: 50.0000 mg | ORAL_TABLET | Freq: Two times a day (BID) | ORAL | Status: DC

## 2011-11-02 ENCOUNTER — Encounter: Payer: Medicare Other | Admitting: Physical Therapy

## 2011-11-06 ENCOUNTER — Encounter: Payer: Medicare Other | Admitting: Physical Therapy

## 2011-11-08 ENCOUNTER — Ambulatory Visit (INDEPENDENT_AMBULATORY_CARE_PROVIDER_SITE_OTHER): Payer: Medicare Other | Admitting: *Deleted

## 2011-11-08 DIAGNOSIS — I4891 Unspecified atrial fibrillation: Secondary | ICD-10-CM

## 2011-11-08 DIAGNOSIS — Z7901 Long term (current) use of anticoagulants: Secondary | ICD-10-CM

## 2011-11-09 ENCOUNTER — Encounter: Payer: Medicare Other | Admitting: Physical Therapy

## 2011-11-09 ENCOUNTER — Other Ambulatory Visit: Payer: Self-pay | Admitting: Internal Medicine

## 2011-11-09 ENCOUNTER — Other Ambulatory Visit: Payer: Self-pay | Admitting: Physical Medicine & Rehabilitation

## 2011-11-13 NOTE — Telephone Encounter (Signed)
Rx error. Med called into pharmacy. sue

## 2011-11-29 ENCOUNTER — Other Ambulatory Visit: Payer: Self-pay | Admitting: Internal Medicine

## 2011-11-29 ENCOUNTER — Other Ambulatory Visit: Payer: Self-pay | Admitting: *Deleted

## 2011-11-29 ENCOUNTER — Other Ambulatory Visit: Payer: Self-pay | Admitting: Physical Medicine & Rehabilitation

## 2011-11-29 MED ORDER — GABAPENTIN 400 MG PO CAPS: 400.0000 mg | ORAL_CAPSULE | Freq: Three times a day (TID) | ORAL | Status: DC

## 2011-12-01 ENCOUNTER — Other Ambulatory Visit: Payer: Self-pay | Admitting: *Deleted

## 2011-12-01 MED ORDER — ALLOPURINOL 300 MG PO TABS: 300.0000 mg | ORAL_TABLET | Freq: Every day | ORAL | Status: DC

## 2011-12-01 MED ORDER — FINASTERIDE 5 MG PO TABS: 5.0000 mg | ORAL_TABLET | Freq: Every day | ORAL | Status: DC

## 2011-12-01 MED ORDER — OMEPRAZOLE 10 MG PO CPDR: 10.0000 mg | DELAYED_RELEASE_CAPSULE | Freq: Every day | ORAL | Status: DC

## 2011-12-01 MED ORDER — METOPROLOL SUCCINATE ER 200 MG PO TB24: 200.0000 mg | ORAL_TABLET | Freq: Two times a day (BID) | ORAL | Status: DC

## 2011-12-01 MED ORDER — LOSARTAN POTASSIUM 50 MG PO TABS: 50.0000 mg | ORAL_TABLET | Freq: Two times a day (BID) | ORAL | Status: DC

## 2011-12-01 NOTE — Telephone Encounter (Signed)
REFILLS ON MEDS SENT TO  PHARMACY

## 2011-12-06 ENCOUNTER — Ambulatory Visit (INDEPENDENT_AMBULATORY_CARE_PROVIDER_SITE_OTHER): Payer: Medicare Other | Admitting: *Deleted

## 2011-12-06 ENCOUNTER — Other Ambulatory Visit: Payer: Self-pay

## 2011-12-06 DIAGNOSIS — I4891 Unspecified atrial fibrillation: Secondary | ICD-10-CM

## 2011-12-06 DIAGNOSIS — Z7901 Long term (current) use of anticoagulants: Secondary | ICD-10-CM

## 2011-12-06 MED ORDER — HYDROCODONE-ACETAMINOPHEN 7.5-325 MG PO TABS: 2.0000 | ORAL_TABLET | Freq: Two times a day (BID) | ORAL | Status: DC | PRN

## 2011-12-30 ENCOUNTER — Other Ambulatory Visit: Payer: Self-pay | Admitting: Internal Medicine

## 2011-12-30 ENCOUNTER — Other Ambulatory Visit: Payer: Self-pay | Admitting: Physical Medicine & Rehabilitation

## 2011-12-30 ENCOUNTER — Other Ambulatory Visit: Payer: Self-pay | Admitting: Cardiology

## 2012-01-03 ENCOUNTER — Ambulatory Visit (INDEPENDENT_AMBULATORY_CARE_PROVIDER_SITE_OTHER): Payer: Medicare Other | Admitting: Pharmacist

## 2012-01-03 DIAGNOSIS — Z7901 Long term (current) use of anticoagulants: Secondary | ICD-10-CM

## 2012-01-03 DIAGNOSIS — I4891 Unspecified atrial fibrillation: Secondary | ICD-10-CM

## 2012-01-03 LAB — POCT INR: INR: 3.3

## 2012-01-05 ENCOUNTER — Other Ambulatory Visit: Payer: Self-pay | Admitting: Physical Medicine & Rehabilitation

## 2012-01-05 ENCOUNTER — Other Ambulatory Visit: Payer: Self-pay | Admitting: Cardiology

## 2012-01-09 ENCOUNTER — Encounter: Payer: Self-pay | Admitting: Physical Medicine and Rehabilitation

## 2012-01-09 ENCOUNTER — Encounter: Payer: Medicare Other | Attending: Physical Medicine & Rehabilitation | Admitting: Physical Medicine and Rehabilitation

## 2012-01-09 VITALS — BP 161/78 | HR 75 | Resp 14 | Ht 75.0 in | Wt 366.0 lb

## 2012-01-09 DIAGNOSIS — G8929 Other chronic pain: Secondary | ICD-10-CM

## 2012-01-09 DIAGNOSIS — M51379 Other intervertebral disc degeneration, lumbosacral region without mention of lumbar back pain or lower extremity pain: Secondary | ICD-10-CM | POA: Insufficient documentation

## 2012-01-09 DIAGNOSIS — M109 Gout, unspecified: Secondary | ICD-10-CM | POA: Insufficient documentation

## 2012-01-09 DIAGNOSIS — J4489 Other specified chronic obstructive pulmonary disease: Secondary | ICD-10-CM | POA: Insufficient documentation

## 2012-01-09 DIAGNOSIS — J449 Chronic obstructive pulmonary disease, unspecified: Secondary | ICD-10-CM | POA: Insufficient documentation

## 2012-01-09 DIAGNOSIS — M545 Low back pain, unspecified: Secondary | ICD-10-CM

## 2012-01-09 DIAGNOSIS — M5137 Other intervertebral disc degeneration, lumbosacral region: Secondary | ICD-10-CM | POA: Insufficient documentation

## 2012-01-09 DIAGNOSIS — G4733 Obstructive sleep apnea (adult) (pediatric): Secondary | ICD-10-CM | POA: Insufficient documentation

## 2012-01-09 DIAGNOSIS — E7800 Pure hypercholesterolemia, unspecified: Secondary | ICD-10-CM | POA: Insufficient documentation

## 2012-01-09 DIAGNOSIS — I10 Essential (primary) hypertension: Secondary | ICD-10-CM | POA: Insufficient documentation

## 2012-01-09 DIAGNOSIS — E669 Obesity, unspecified: Secondary | ICD-10-CM | POA: Insufficient documentation

## 2012-01-09 DIAGNOSIS — M1990 Unspecified osteoarthritis, unspecified site: Secondary | ICD-10-CM | POA: Insufficient documentation

## 2012-01-09 NOTE — Progress Notes (Addendum)
Subjective:    Patient ID: Tim [name], male    DOB: October 07, 1940, 71 y.o.   MRN: [id number]  HPI Tim [name] returns today, he has a history of a hereditary peripheral  neuropathy as well as lumbar spondylosis. He has responded x1 to S1  transforaminal injection, however, subsequent repeats were not  successful. He has done some type core strengthening exercises, which  have been helpful once he gets through the initial discomfort.  The patient has undergone right shoulder surgery in February. He reports that he is not able to walk for exercise right now, because he has open sores on his feet.  He states that he is interested in the TENS unit,which has helped him in the past to relief pain. Pain Inventory Average Pain 4 Pain Right Now 2 My pain is intermittent, sharp, dull, stabbing and aching  In the last 24 hours, has pain interfered with the following? General activity 8 Relation with others 8 Enjoyment of life 7 What TIME of day is your pain at its worst? night Sleep (in general) Poor  Pain is worse with: walking, bending, standing and some activites Pain improves with: rest, pacing activities and medication Relief from Meds: 6  Mobility walk without assistance walk with assistance use a cane how many minutes can you walk? 5 ability to climb steps?  no do you drive?  yes use a wheelchair transfers alone Do you have any goals in this area?  no  Function retired I need assistance with the following:  household duties and shopping Do you have any goals in this area?  no  Neuro/Psych weakness numbness trouble walking dizziness  Prior Studies Any changes since last visit?  no  Physicians involved in your care Any changes since last visit?  no   Family History  Problem Relation Age of Onset  . Hypertension Mother   . Coronary artery disease Mother   . Heart attack Mother   . Pulmonary fibrosis Father    History   Social History  . Marital Status:  Married    Spouse Name: [name]    Number of Children: [name]  . Years of Education: [name]   Social History Main Topics  . Smoking status: Never Smoker   . Smokeless tobacco: None  . Alcohol Use: No  . Drug Use: Yes    Special: Oxycodone  . Sexually Active: None   Other Topics Concern  . None   Social History Narrative  . None   Past Surgical History  Procedure Date  . Hemorrhoid surgery   . Vitrectomy   . Intraocular lens insertion   . Leg surgery   . Knee surgery   . Traeculectomy   . Shoulder arthroscopy 08/30/2011    Procedure: ARTHROSCOPY SHOULDER;  Surgeon: [name], MD;  Location: [facility];  Service: Orthopedics;  Laterality: Right;  Right Shoulder Arthroscopy, Debridement, and Decompression   Past Medical History  Diagnosis Date  . HYPERCHOLESTEROLEMIA-PURE   . GOUT   . OBSTRUCTIVE SLEEP APNEA   . Essential hypertension, benign   . VENOUS INSUFFICIENCY   . ASTHMA   . COPD   . VENTRAL HERNIA   . OSTEOARTHRITIS   . CATARACT, HX OF   . PULMONARY HYPERTENSION, HX OF   . Hallux valgus (acquired)   . Obesity, unspecified   . Osteoarthrosis, unspecified whether generalized or localized, unspecified site   . Unspecified glaucoma   . Heart murmur   . Atrial fibrillation   . Lumbosacral  neuritis   . Unspecified hereditary and idiopathic peripheral neuropathy   . Lumbosacral spondylosis    BP 161/78  Pulse 75  Resp 14  Ht 6' 3"  (1.905 m)  Wt 366 lb (166.017 kg)  BMI 45.75 kg/m2  SpO2 94%      Review of Systems  Constitutional: Positive for fever and chills.  Respiratory: Positive for apnea and shortness of breath.   Musculoskeletal: Positive for back pain, joint swelling and gait problem.  Neurological: Positive for dizziness, weakness and numbness.  Hematological: Bruises/bleeds easily.  All other systems reviewed and are negative.       Objective:   Physical Exam  Constitutional: He is oriented to person, place, and time. He appears well-developed.        Obese walks with a cane  HENT:  Head: Normocephalic.  Neck: Neck supple.  Musculoskeletal: He exhibits tenderness.  Neurological: He is alert and oriented to person, place, and time.  Skin: Skin is warm and dry.  Psychiatric: He has a normal mood and affect.   Symmetric normal motor tone is noted throughout. Normal muscle bulk. Muscle testing reveals 5/5 muscle strength of the upper extremity, and 5/5 of the lower extremity. Full range of motion in upper and lower extremities. ROM of spine is  restricted.  DTR in the upper and lower extremity are present and symmetric 2+. No clonus is noted.  Patient arises from chair with difficulty. Wide based gait with a cane.        Assessment & Plan:  This is a 71 year old  male with 1.DDD L-spine 2.Low back pain 3 s/p Right shoulder surgery  Plan :  Advised patient to strengthen his lower extremity muscles by riding the stationary bike, until he's able to walk again. He has some open sores on his left tibia and between his toes and therefore cannot walk right now.[facility] for this. Ordered a TENS unit with instruction by PT, the patient reported that this has helped him in the past with his pain. Patient was interested in acupuncture. Follow up in 3 month.

## 2012-01-09 NOTE — Patient Instructions (Signed)
Strengthen your leg muscles by riding the stationary bike, try to do a massage with an ice bottle.

## 2012-01-31 ENCOUNTER — Ambulatory Visit (INDEPENDENT_AMBULATORY_CARE_PROVIDER_SITE_OTHER): Payer: Medicare Other | Admitting: *Deleted

## 2012-01-31 DIAGNOSIS — I4891 Unspecified atrial fibrillation: Secondary | ICD-10-CM

## 2012-01-31 DIAGNOSIS — Z7901 Long term (current) use of anticoagulants: Secondary | ICD-10-CM

## 2012-01-31 LAB — POCT INR: INR: 2.5

## 2012-02-01 ENCOUNTER — Other Ambulatory Visit: Payer: Self-pay | Admitting: Physical Medicine & Rehabilitation

## 2012-02-01 ENCOUNTER — Other Ambulatory Visit: Payer: Self-pay | Admitting: Cardiology

## 2012-02-13 ENCOUNTER — Other Ambulatory Visit: Payer: Self-pay | Admitting: Internal Medicine

## 2012-02-29 ENCOUNTER — Ambulatory Visit (INDEPENDENT_AMBULATORY_CARE_PROVIDER_SITE_OTHER): Payer: Medicare Other | Admitting: Pharmacist

## 2012-02-29 DIAGNOSIS — I4891 Unspecified atrial fibrillation: Secondary | ICD-10-CM

## 2012-02-29 DIAGNOSIS — Z7901 Long term (current) use of anticoagulants: Secondary | ICD-10-CM

## 2012-02-29 LAB — POCT INR: INR: 3.4

## 2012-03-01 ENCOUNTER — Other Ambulatory Visit: Payer: Self-pay | Admitting: Physical Medicine & Rehabilitation

## 2012-03-01 ENCOUNTER — Other Ambulatory Visit: Payer: Self-pay | Admitting: Cardiology

## 2012-03-01 ENCOUNTER — Other Ambulatory Visit: Payer: Self-pay | Admitting: Internal Medicine

## 2012-03-01 NOTE — Telephone Encounter (Signed)
Fax Received. Refill Completed. Tim [name] (R.M.A)  Pt needs appointment then refill can be made

## 2012-03-14 ENCOUNTER — Other Ambulatory Visit: Payer: Self-pay | Admitting: Physical Medicine & Rehabilitation

## 2012-03-27 ENCOUNTER — Ambulatory Visit (INDEPENDENT_AMBULATORY_CARE_PROVIDER_SITE_OTHER): Payer: Medicare Other | Admitting: *Deleted

## 2012-03-27 DIAGNOSIS — I4891 Unspecified atrial fibrillation: Secondary | ICD-10-CM

## 2012-03-27 DIAGNOSIS — Z7901 Long term (current) use of anticoagulants: Secondary | ICD-10-CM

## 2012-03-29 ENCOUNTER — Other Ambulatory Visit: Payer: Self-pay | Admitting: Physical Medicine & Rehabilitation

## 2012-03-29 ENCOUNTER — Other Ambulatory Visit: Payer: Self-pay | Admitting: Cardiology

## 2012-03-29 ENCOUNTER — Other Ambulatory Visit: Payer: Self-pay | Admitting: Internal Medicine

## 2012-04-09 ENCOUNTER — Encounter: Payer: Self-pay | Admitting: Physical Medicine and Rehabilitation

## 2012-04-09 ENCOUNTER — Encounter
Payer: Medicare Other | Attending: Physical Medicine and Rehabilitation | Admitting: Physical Medicine and Rehabilitation

## 2012-04-09 ENCOUNTER — Telehealth: Payer: Self-pay | Admitting: Physical Medicine and Rehabilitation

## 2012-04-09 VITALS — BP 163/73 | HR 65 | Resp 18 | Ht 75.0 in | Wt 366.0 lb

## 2012-04-09 DIAGNOSIS — M545 Low back pain, unspecified: Secondary | ICD-10-CM | POA: Insufficient documentation

## 2012-04-09 DIAGNOSIS — G4733 Obstructive sleep apnea (adult) (pediatric): Secondary | ICD-10-CM | POA: Insufficient documentation

## 2012-04-09 DIAGNOSIS — Z9889 Other specified postprocedural states: Secondary | ICD-10-CM | POA: Insufficient documentation

## 2012-04-09 DIAGNOSIS — L989 Disorder of the skin and subcutaneous tissue, unspecified: Secondary | ICD-10-CM | POA: Insufficient documentation

## 2012-04-09 DIAGNOSIS — J4489 Other specified chronic obstructive pulmonary disease: Secondary | ICD-10-CM | POA: Insufficient documentation

## 2012-04-09 DIAGNOSIS — E669 Obesity, unspecified: Secondary | ICD-10-CM | POA: Insufficient documentation

## 2012-04-09 DIAGNOSIS — E7800 Pure hypercholesterolemia, unspecified: Secondary | ICD-10-CM | POA: Insufficient documentation

## 2012-04-09 DIAGNOSIS — J449 Chronic obstructive pulmonary disease, unspecified: Secondary | ICD-10-CM | POA: Insufficient documentation

## 2012-04-09 DIAGNOSIS — I10 Essential (primary) hypertension: Secondary | ICD-10-CM | POA: Insufficient documentation

## 2012-04-09 DIAGNOSIS — M109 Gout, unspecified: Secondary | ICD-10-CM | POA: Insufficient documentation

## 2012-04-09 DIAGNOSIS — M5137 Other intervertebral disc degeneration, lumbosacral region: Secondary | ICD-10-CM | POA: Insufficient documentation

## 2012-04-09 DIAGNOSIS — M549 Dorsalgia, unspecified: Secondary | ICD-10-CM

## 2012-04-09 DIAGNOSIS — G8929 Other chronic pain: Secondary | ICD-10-CM

## 2012-04-09 DIAGNOSIS — G608 Other hereditary and idiopathic neuropathies: Secondary | ICD-10-CM | POA: Insufficient documentation

## 2012-04-09 DIAGNOSIS — M51379 Other intervertebral disc degeneration, lumbosacral region without mention of lumbar back pain or lower extremity pain: Secondary | ICD-10-CM | POA: Insufficient documentation

## 2012-04-09 MED ORDER — OXYCODONE-ACETAMINOPHEN 10-325 MG PO TABS: 1.0000 | ORAL_TABLET | Freq: Three times a day (TID) | ORAL | Status: DC | PRN

## 2012-04-09 MED ORDER — HYDROCODONE-ACETAMINOPHEN 7.5-325 MG PO TABS: 1.0000 | ORAL_TABLET | Freq: Three times a day (TID) | ORAL | Status: DC | PRN

## 2012-04-09 NOTE — Telephone Encounter (Signed)
Patient will need another order for instruction on using Tens Unit in [place]

## 2012-04-09 NOTE — Patient Instructions (Signed)
Continue to ride the stationary bike, try to start walking as tolerated.

## 2012-04-09 NOTE — Progress Notes (Signed)
Subjective:    Patient ID: Tim [name], male    DOB: July 11, 1940, 71 y.o.   MRN: [id number]  HPI Tim [name] returns today, he has a history of a hereditary peripheral  neuropathy as well as lumbar spondylosis. He has responded x1 to S1  transforaminal injection, however, subsequent repeats were not  successful. He is not doing his core strengthening exercises, because they would aggrevate his back pain.  The patient has undergone right shoulder surgery in February. He reports that he is not able to walk for exercise right now, because he has open sores on his feet.  He states that he is still interested in the TENS unit,which has helped him in the past to relief pain, but he never got a call from PT after I ordered it at his last visit.  Pain Inventory Average Pain 8 Pain Right Now 2 My pain is intermittent, constant, sharp, dull and stabbing  In the last 24 hours, has pain interfered with the following? General activity 4 Relation with others 7 Enjoyment of life 7 What TIME of day is your pain at its worst? night Sleep (in general) Poor  Pain is worse with: walking, bending and standing Pain improves with: rest, pacing activities and medication Relief from Meds: 6  Mobility use a cane use a wheelchair transfers alone  Function retired I need assistance with the following:  meal prep, household duties and shopping  Neuro/Psych numbness tingling trouble walking  Prior Studies Any changes since last visit?  no  Physicians involved in your care Any changes since last visit?  no   Family History  Problem Relation Age of Onset  . Hypertension Mother   . Coronary artery disease Mother   . Heart attack Mother   . Pulmonary fibrosis Father    History   Social History  . Marital Status: Married    Spouse Name: [name]    Number of Children: [name]  . Years of Education: [name]   Social History Main Topics  . Smoking status: Never Smoker   . Smokeless tobacco: None    . Alcohol Use: No  . Drug Use: Yes    Special: Oxycodone  . Sexually Active: None   Other Topics Concern  . None   Social History Narrative  . None   Past Surgical History  Procedure Date  . Hemorrhoid surgery   . Vitrectomy   . Intraocular lens insertion   . Leg surgery   . Knee surgery   . Traeculectomy   . Shoulder arthroscopy 08/30/2011    Procedure: ARTHROSCOPY SHOULDER;  Surgeon: [name], MD;  Location: [facility];  Service: Orthopedics;  Laterality: Right;  Right Shoulder Arthroscopy, Debridement, and Decompression   Past Medical History  Diagnosis Date  . HYPERCHOLESTEROLEMIA-PURE   . GOUT   . OBSTRUCTIVE SLEEP APNEA   . Essential hypertension, benign   . VENOUS INSUFFICIENCY   . ASTHMA   . COPD   . VENTRAL HERNIA   . OSTEOARTHRITIS   . CATARACT, HX OF   . PULMONARY HYPERTENSION, HX OF   . Hallux valgus (acquired)   . Obesity, unspecified   . Osteoarthrosis, unspecified whether generalized or localized, unspecified site   . Unspecified glaucoma(365.9)   . Heart murmur   . Atrial fibrillation   . Lumbosacral neuritis   . Unspecified hereditary and idiopathic peripheral neuropathy   . Lumbosacral spondylosis    Ht 6' 3"  (1.905 m)  Wt 366 lb (166.017 kg)  BMI  45.75 kg/m2      Review of Systems  Constitutional: Negative.   HENT: Negative.   Eyes: Negative.   Respiratory: Negative.   Cardiovascular: Negative.   Gastrointestinal: Negative.   Genitourinary: Negative.   Musculoskeletal: Positive for myalgias, back pain, arthralgias and gait problem.  Skin: Negative.   Neurological: Positive for numbness.       Tingling  Hematological: Negative.   Psychiatric/Behavioral: Negative.        Objective:   Physical Exam Constitutional: He is oriented to person, place, and time. He appears well-developed.  Obese walks with a cane  HENT:  Head: Normocephalic.  Neck: Neck supple.  Musculoskeletal: He exhibits tenderness.  Neurological: He is alert  and oriented to person, place, and time.  Skin: Skin is warm and dry.  Psychiatric: He has a normal mood and affect.   Symmetric normal motor tone is noted throughout. Normal muscle bulk. Muscle testing reveals 5/5 muscle strength of the upper extremity, and 5/5 of the lower extremity. Full range of motion in upper and lower extremities. ROM of spine is restricted.  DTR in the upper and lower extremity are present and symmetric 2+. No clonus is noted.  Patient arises from chair with difficulty. Wide based gait with a cane.         Assessment & Plan:  This is a 71 year old male with  1.DDD L-spine  2.Low back pain  3 s/p Right shoulder surgery  Plan :  Advised patient to strengthen his lower extremity muscles by riding the stationary bike, until he's able to walk again. The open sore on his left tibia has healed , the one between his toes is still open, treated by Dr. Jeanett Schlein, and therefore cannot walk right now.Follows up wound care clinic for this. Ordered a TENS unit with instruction by PT, at his last visit, he never got a call back, informed front desk staff, if it is necessary I will order it again.  Refilled his hydrocodone 7.5 # 60, and Oxycodone 10 # 30, for severe breakthrough pain, which he uses very sparingly. Follow up in 3 month.

## 2012-04-10 NOTE — Telephone Encounter (Signed)
Lm with wife advising him to call us regarding his request.  More information needed.

## 2012-04-12 NOTE — Telephone Encounter (Signed)
Lm for patient to return call.

## 2012-04-15 ENCOUNTER — Other Ambulatory Visit: Payer: Self-pay | Admitting: *Deleted

## 2012-04-15 MED ORDER — LOSARTAN POTASSIUM 50 MG PO TABS: 50.0000 mg | ORAL_TABLET | Freq: Two times a day (BID) | ORAL | Status: DC

## 2012-04-15 NOTE — Telephone Encounter (Signed)
losaraten sent to [facility]. For 180 quanity 3 refills

## 2012-04-24 ENCOUNTER — Ambulatory Visit (INDEPENDENT_AMBULATORY_CARE_PROVIDER_SITE_OTHER): Payer: Medicare Other | Admitting: *Deleted

## 2012-04-24 DIAGNOSIS — I4891 Unspecified atrial fibrillation: Secondary | ICD-10-CM

## 2012-04-24 DIAGNOSIS — Z7901 Long term (current) use of anticoagulants: Secondary | ICD-10-CM

## 2012-04-24 LAB — POCT INR: INR: 2.8

## 2012-05-07 ENCOUNTER — Other Ambulatory Visit: Payer: Self-pay | Admitting: Internal Medicine

## 2012-05-14 ENCOUNTER — Other Ambulatory Visit: Payer: Self-pay | Admitting: Internal Medicine

## 2012-05-14 ENCOUNTER — Encounter: Payer: Self-pay | Admitting: Internal Medicine

## 2012-05-15 MED ORDER — METOPROLOL SUCCINATE ER 200 MG PO TB24: 200.0000 mg | ORAL_TABLET | Freq: Every day | ORAL | Status: DC

## 2012-05-16 ENCOUNTER — Other Ambulatory Visit: Payer: Self-pay | Admitting: Physical Medicine and Rehabilitation

## 2012-05-16 ENCOUNTER — Other Ambulatory Visit: Payer: Self-pay | Admitting: Internal Medicine

## 2012-05-16 ENCOUNTER — Other Ambulatory Visit: Payer: Self-pay | Admitting: Cardiology

## 2012-05-16 ENCOUNTER — Other Ambulatory Visit: Payer: Self-pay | Admitting: Physical Medicine & Rehabilitation

## 2012-05-16 ENCOUNTER — Telehealth: Payer: Self-pay

## 2012-05-16 NOTE — Telephone Encounter (Signed)
Clarise Cruz at [facility] called to make sure it was ok to fill hydrocodone since patient is on ultracet.

## 2012-05-16 NOTE — Telephone Encounter (Signed)
Ok to fill both.  Tim [name] informed.

## 2012-05-28 ENCOUNTER — Ambulatory Visit (INDEPENDENT_AMBULATORY_CARE_PROVIDER_SITE_OTHER): Payer: Medicare Other | Admitting: Pharmacist

## 2012-05-28 DIAGNOSIS — Z7901 Long term (current) use of anticoagulants: Secondary | ICD-10-CM

## 2012-05-28 DIAGNOSIS — I4891 Unspecified atrial fibrillation: Secondary | ICD-10-CM

## 2012-05-28 LAB — POCT INR: INR: 2.4

## 2012-06-04 ENCOUNTER — Other Ambulatory Visit: Payer: Self-pay | Admitting: Cardiology

## 2012-06-04 ENCOUNTER — Other Ambulatory Visit: Payer: Self-pay | Admitting: Physical Medicine & Rehabilitation

## 2012-06-05 ENCOUNTER — Other Ambulatory Visit: Payer: Self-pay | Admitting: *Deleted

## 2012-06-05 ENCOUNTER — Other Ambulatory Visit (INDEPENDENT_AMBULATORY_CARE_PROVIDER_SITE_OTHER): Payer: Medicare Other

## 2012-06-05 ENCOUNTER — Encounter: Payer: Self-pay | Admitting: Internal Medicine

## 2012-06-05 ENCOUNTER — Ambulatory Visit (INDEPENDENT_AMBULATORY_CARE_PROVIDER_SITE_OTHER): Payer: Medicare Other | Admitting: Internal Medicine

## 2012-06-05 VITALS — BP 128/64 | HR 93 | Temp 97.5°F | Resp 12 | Ht 73.0 in | Wt 353.1 lb

## 2012-06-05 DIAGNOSIS — G4733 Obstructive sleep apnea (adult) (pediatric): Secondary | ICD-10-CM

## 2012-06-05 DIAGNOSIS — E7800 Pure hypercholesterolemia, unspecified: Secondary | ICD-10-CM

## 2012-06-05 DIAGNOSIS — I10 Essential (primary) hypertension: Secondary | ICD-10-CM

## 2012-06-05 DIAGNOSIS — I872 Venous insufficiency (chronic) (peripheral): Secondary | ICD-10-CM

## 2012-06-05 DIAGNOSIS — G608 Other hereditary and idiopathic neuropathies: Secondary | ICD-10-CM

## 2012-06-05 DIAGNOSIS — I4891 Unspecified atrial fibrillation: Secondary | ICD-10-CM

## 2012-06-05 DIAGNOSIS — I83009 Varicose veins of unspecified lower extremity with ulcer of unspecified site: Secondary | ICD-10-CM

## 2012-06-05 DIAGNOSIS — E669 Obesity, unspecified: Secondary | ICD-10-CM

## 2012-06-05 DIAGNOSIS — M1990 Unspecified osteoarthritis, unspecified site: Secondary | ICD-10-CM

## 2012-06-05 DIAGNOSIS — Z0000 Encounter for general adult medical examination without abnormal findings: Secondary | ICD-10-CM

## 2012-06-05 DIAGNOSIS — M109 Gout, unspecified: Secondary | ICD-10-CM

## 2012-06-05 DIAGNOSIS — L97909 Non-pressure chronic ulcer of unspecified part of unspecified lower leg with unspecified severity: Secondary | ICD-10-CM

## 2012-06-05 LAB — HEPATIC FUNCTION PANEL
Albumin: 4.2 g/dL (ref 3.5–5.2)
Alkaline Phosphatase: 61 U/L (ref 39–117)
Total Bilirubin: 0.9 mg/dL (ref 0.3–1.2)

## 2012-06-05 LAB — LIPID PANEL
Cholesterol: 108 mg/dL (ref 0–200)
HDL: 52.3 mg/dL (ref >39.00)
Triglycerides: 94 mg/dL (ref 0.0–149.0)

## 2012-06-05 LAB — COMPREHENSIVE METABOLIC PANEL
Albumin: 4.2 g/dL (ref 3.5–5.2)
BUN: 14 mg/dL (ref 6–23)
CO2: 32 mEq/L (ref 19–32)
GFR: 99.41 mL/min (ref >60.00)
Glucose, Bld: 127 mg/dL — ABNORMAL HIGH (ref 70–99)
Sodium: 140 mEq/L (ref 135–145)
Total Bilirubin: 0.9 mg/dL (ref 0.3–1.2)
Total Protein: 7.6 g/dL (ref 6.0–8.3)

## 2012-06-05 LAB — HEMOGLOBIN A1C: Hgb A1c MFr Bld: 5.8 % (ref 4.6–6.5)

## 2012-06-05 LAB — TSH: TSH: 1.05 u[IU]/mL (ref 0.35–5.50)

## 2012-06-05 MED ORDER — FUROSEMIDE 40 MG PO TABS: 40.0000 mg | ORAL_TABLET | Freq: Every day | ORAL | Status: DC

## 2012-06-05 MED ORDER — FINASTERIDE 5 MG PO TABS: 5.0000 mg | ORAL_TABLET | Freq: Every day | ORAL | Status: DC

## 2012-06-05 MED ORDER — FUROSEMIDE 40 MG PO TABS: 40.0000 mg | ORAL_TABLET | Freq: Two times a day (BID) | ORAL | Status: DC

## 2012-06-05 MED ORDER — FLUCONAZOLE 100 MG PO TABS: 100.0000 mg | ORAL_TABLET | Freq: Every day | ORAL | Status: DC

## 2012-06-05 MED ORDER — OMEPRAZOLE 10 MG PO CPDR: 10.0000 mg | DELAYED_RELEASE_CAPSULE | Freq: Every day | ORAL | Status: DC

## 2012-06-05 NOTE — Patient Instructions (Addendum)
Good to see you. You seem to be doing pretty well, all things considered.  For yeast infection, including penile, will treat with diflucan 100 mg daily for 14 days and you may extend this an additional 14 days if needed.  Full labs today.  Full report to follow including labs. You may also check your Mychart account

## 2012-06-05 NOTE — Progress Notes (Signed)
Subjective:    Patient ID: Tim [name], male    DOB: 31-Jul-1940, 71 y.o.   MRN: [id number]  HPI The patient is here for annual Medicare wellness examination and management of other chronic and acute problems.  Interval history: arthroscopic surgery right shoulder for rotator cuff repair - doing exercise, improved ROM; doing well with CPAP; peripheral neuropathy persists but there is no pain; continues with rehab - Dr. Ella Bodo; lower extremity ulcers - healed.   He did have a fall today - no LOC, no injury.   The risk factors are reflected in the social history.  The roster of all physicians providing medical care to patient - is listed in the Snapshot section of the chart.  Activities of daily living:  The patient is dpendent in all ADLs for showering/bathing: independent dressing, toileting, feeding as well as independent mobility  Home safety : The patient has smoke detectors in the home. Falls - frequent falls; home is fall safe with grab bars in bathroom. They wear seatbelts.No firearms at home . There is no violence in the home.   There is no risks for hepatitis, STDs or HIV. There is no history of blood transfusion. They have no travel history to infectious disease endemic areas of the world.  The patient has seen their dentist in the last six month. They have seen their eye doctor in the last year. They deny any hearing difficulty and have not had audiologic testing in the last year.  They do not  have excessive sun exposure. Discussed the need for sun protection: hats, long sleeves and use of sunscreen if there is significant sun exposure.   Diet: the importance of a healthy diet is discussed. They do have a healthy diet.  The patient has no regular exercise program.  The benefits of regular aerobic exercise were discussed.  Depression screen: there are no signs or vegative symptoms of depression- irritability, anhedonia, worry about limitations, some self pity. Situational  depression appropriate for circumstance  Cognitive assessment: the patient manages all their financial and personal affairs and is actively engaged.   Past Medical History  Diagnosis Date  . HYPERCHOLESTEROLEMIA-PURE   . GOUT   . OBSTRUCTIVE SLEEP APNEA   . Essential hypertension, benign   . VENOUS INSUFFICIENCY   . ASTHMA   . COPD   . VENTRAL HERNIA   . OSTEOARTHRITIS   . CATARACT, HX OF   . PULMONARY HYPERTENSION, HX OF   . Hallux valgus (acquired)   . Obesity, unspecified   . Osteoarthrosis, unspecified whether generalized or localized, unspecified site   . Unspecified glaucoma(365.9)   . Heart murmur   . Atrial fibrillation   . Lumbosacral neuritis   . Unspecified hereditary and idiopathic peripheral neuropathy   . Lumbosacral spondylosis    Past Surgical History  Procedure Date  . Hemorrhoid surgery   . Vitrectomy   . Intraocular lens insertion   . Leg surgery   . Knee surgery   . Traeculectomy   . Shoulder arthroscopy 08/30/2011    Procedure: ARTHROSCOPY SHOULDER;  Surgeon: [name], MD;  Location: [facility];  Service: Orthopedics;  Laterality: Right;  Right Shoulder Arthroscopy, Debridement, and Decompression   Family History  Problem Relation Age of Onset  . Hypertension Mother   . Coronary artery disease Mother   . Heart attack Mother   . Pulmonary fibrosis Father    History   Social History  . Marital Status: Married    Spouse Name:  [name]    Number of Children: 0  . Years of Education: 20   Occupational History  . engineering     retired   Social History Main Topics  . Smoking status: Never Smoker   . Smokeless tobacco: Never Used  . Alcohol Use: No  . Drug Use: Yes    Special: Oxycodone  . Sexually Active: Not Currently   Other Topics Concern  . Not on file   Social History Narrative   HSG, [name] - BS, [name] - MS-engineering, 2 years on PhD - [employer] [place]. Married - '65 - 77yr/divorced; '76- 3 yrs/divorced; '92 . No  children. 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 ACP/Living Will - Yes CPR; long-term Mechanical ventilation as long as he was able to cognate; ok for long term artificial nutrition. Precondition being able to cognate and not to have too much pain.       Vision, hearing, body mass index were assessed and reviewed.   During the course of the visit the patient was educated and counseled about appropriate screening and preventive services including : fall prevention , diabetes screening, nutrition counseling, colorectal cancer screening, and recommended immunizations.    Review of Systems Constitutional:  Posittive for unexplained fevers but was of brief duration (8 hrs or less), activity change and unexpected weight change.  HEENT:  Negative for hearing loss, ear pain, congestion, neck stiffness and postnasal drip. Negative for sore throat or swallowing problems. Negative for dental complaints.   Eyes: Negative for vision loss or change in visual acuity.  Respiratory: Negative for chest tightness and wheezing. Negative for DOE.   Cardiovascular: Negative for chest pain or palpitations. No decreased exercise tolerance Gastrointestinal: No change in bowel habit. No bloating or gas. No reflux or indigestion. Genitourinary: Negative for urgency, frequency, flank pain and difficulty urinating.  Musculoskeletal: Positive for myalgias, back pain, arthralgias and gait problem.  Neurological: Negative for dizziness, tremors, weakness and headaches.  Hematological: Negative for adenopathy.  Psychiatric/Behavioral: Negative for behavioral problems and dysphoric mood.       Objective:   Physical Exam Filed Vitals:   06/05/12 1335  BP: 128/64  Pulse: 93  Temp: 97.5 F (36.4 C)  Resp: 12   Wt Readings from Last 3 Encounters:  06/05/12 353 lb 1.3 oz (160.156 kg)  04/09/12 366 lb (166.017 kg)  01/09/12 366 lb (166.017 kg)   Gen'l: Well nourished well developed white male  in no acute distress  HEENT: Head: Normocephalic and atraumatic. Right Ear: External ear normal. EAC/TM nl. Left Ear: External ear normal.  EAC/TM nl. Nose: Nose normal. Mouth/Throat: Oropharynx is clear and moist. Dentition - native, in good repair. No buccal or palatal lesions. Posterior pharynx clear. Eyes: Conjunctivae and sclera clear. EOM intact. Pupils are equal, round, and reactive to light. Right eye exhibits no discharge. Left eye exhibits no discharge. Neck: Normal range of motion. Neck supple. No JVD present. No tracheal deviation present. No thyromegaly present.  Cardiovascular: Normal rate, regular rhythm, no gallop, no friction rub, murmur heard RSB (aortic) and LSB (pulmonic).      Quiet precordium. 2+ radial and trace DP pulses . No carotid bruits Pulmonary/Chest: Effort normal. No respiratory distress or increased WOB, no wheezes, no rales. No chest wall deformity or CVAT. Abdomen: Soft. Bowel sounds are normal in all quadrants. He exhibits no distension, no tenderness, no rebound or guarding, No heptosplenomegaly  Genitourinary:  normal scrotum, micropenis-retracted - on exam mild erythema skeletal: Normal  range of motion except right shoulder with about 70% normal flexion, extension, 50% rotation. No redneess, synovial thickening or deformity. Full range of motion preserved about all small, median joints.  Lymphadenopathy:    He has no cervical or supraclavicular adenopathy.  Neurological: He is alert and oriented to person, place, and time. CN II-XII intact. DTRs 2+ and symmetrical biceps, radial and patellar tendons. Cerebellar function normal with no tremor, rigidity, normal gait and station. Sensation not tested with known dense peripheral neuropathy Skin: Skin is warm and dry. No rash noted. Hyperemia distal LE with thickening of skin due to chronic edema. No skin breakdown or ulcerations LE.  Psychiatric: He has a normal mood and affect. His behavior is normal. Thought content  normal.   Lab Results  Component Value Date   WBC 6.1 08/25/2011   HGB 12.7* 08/25/2011   HCT 36.6* 08/25/2011   PLT 147* 08/25/2011   GLUCOSE 127* 06/05/2012   CHOL 108 06/05/2012   TRIG 94.0 06/05/2012   HDL 52.30 06/05/2012   LDLCALC 37 06/05/2012   ALT 20 06/05/2012   ALT 20 06/05/2012   AST 22 06/05/2012   AST 22 06/05/2012   NA 140 06/05/2012   K 4.2 06/05/2012   CL 101 06/05/2012   CREATININE 0.8 06/05/2012   BUN 14 06/05/2012   CO2 32 06/05/2012   TSH 1.05 06/05/2012   INR 2.4 05/28/2012   HGBA1C 5.8 06/05/2012         Assessment & Plan:

## 2012-06-09 DIAGNOSIS — Z0000 Encounter for general adult medical examination without abnormal findings: Secondary | ICD-10-CM | POA: Insufficient documentation

## 2012-06-09 NOTE — Assessment & Plan Note (Signed)
Obesity remains a major health issue and he is aware of the role obesity plays in his other morbid condition.  Plan - weight management: smart food choices, PORTION SIZE CONTROL, regular aerobic calorie burn of some sort.  Target weight  -  280 lbs  Goal -  To loose 1-2 lbs per month (3-5 year project!)

## 2012-06-09 NOTE — Assessment & Plan Note (Signed)
Interval history as noted per HPI: shoulder work, continued management of chronic problems. Limited physical exam without marked abnormalities. Lab results reviewed and reasonable. No record available of colorectal cancer screening. He has aged out of routine prostate caner screening Per ACU 4/13 guidelines. Immunizations current except for shingles vaccine.  In summary - a very pleasant fellow with multiple medical issues but who does appear medically stable at today's exam. He will return in 6 months for routine follow-up.

## 2012-06-09 NOTE — Assessment & Plan Note (Signed)
Chronic skin changes with thickening and hyperemia but looking better than in the past. No evidence of skin breakdown.

## 2012-06-09 NOTE — Assessment & Plan Note (Signed)
Doing well with CPAP. He is followed on a regular basis by Dr. Parke Simmers in [place].

## 2012-06-09 NOTE — Assessment & Plan Note (Signed)
BP Readings from Last 3 Encounters:  06/05/12 128/64  04/09/12 163/73  01/09/12 161/78   Good control today on present regimen.

## 2012-06-09 NOTE — Assessment & Plan Note (Signed)
LDL is very low, HDL is well up into normal range. Liver functions are normal.  Plan  continue present regimen.

## 2012-06-09 NOTE — Assessment & Plan Note (Signed)
Multi-joint involvement. Recently with shoulder arthroscopy. He remains independent in mobility.

## 2012-06-09 NOTE — Assessment & Plan Note (Signed)
Stable rhythm on exam today. Management per Dr. Stanford Breed.

## 2012-06-09 NOTE — Assessment & Plan Note (Signed)
Resolved

## 2012-06-17 ENCOUNTER — Other Ambulatory Visit: Payer: Self-pay | Admitting: *Deleted

## 2012-06-17 MED ORDER — HYDROCODONE-ACETAMINOPHEN 7.5-325 MG PO TABS
1.0000 | ORAL_TABLET | Freq: Three times a day (TID) | ORAL | Status: DC | PRN
Start: 2012-06-17 — End: 2012-07-09

## 2012-06-18 ENCOUNTER — Other Ambulatory Visit: Payer: Self-pay | Admitting: *Deleted

## 2012-06-18 MED ORDER — METOPROLOL SUCCINATE ER 200 MG PO TB24: ORAL_TABLET | ORAL | Status: DC

## 2012-07-06 ENCOUNTER — Other Ambulatory Visit: Payer: Self-pay | Admitting: Internal Medicine

## 2012-07-06 ENCOUNTER — Other Ambulatory Visit: Payer: Self-pay | Admitting: Physical Medicine & Rehabilitation

## 2012-07-06 ENCOUNTER — Other Ambulatory Visit: Payer: Self-pay | Admitting: Cardiology

## 2012-07-08 ENCOUNTER — Other Ambulatory Visit: Payer: Self-pay | Admitting: *Deleted

## 2012-07-08 MED ORDER — FINASTERIDE 5 MG PO TABS: 5.0000 mg | ORAL_TABLET | Freq: Every day | ORAL | Status: DC

## 2012-07-08 MED ORDER — ALLOPURINOL 300 MG PO TABS: 300.0000 mg | ORAL_TABLET | Freq: Every day | ORAL | Status: DC

## 2012-07-09 ENCOUNTER — Encounter: Payer: Self-pay | Admitting: Physical Medicine and Rehabilitation

## 2012-07-09 ENCOUNTER — Encounter
Payer: Medicare Other | Attending: Physical Medicine and Rehabilitation | Admitting: Physical Medicine and Rehabilitation

## 2012-07-09 VITALS — BP 163/75 | HR 62 | Resp 14 | Ht 73.0 in | Wt 352.6 lb

## 2012-07-09 DIAGNOSIS — G8929 Other chronic pain: Secondary | ICD-10-CM

## 2012-07-09 DIAGNOSIS — M51379 Other intervertebral disc degeneration, lumbosacral region without mention of lumbar back pain or lower extremity pain: Secondary | ICD-10-CM | POA: Insufficient documentation

## 2012-07-09 DIAGNOSIS — M545 Low back pain, unspecified: Secondary | ICD-10-CM

## 2012-07-09 DIAGNOSIS — M5136 Other intervertebral disc degeneration, lumbar region: Secondary | ICD-10-CM

## 2012-07-09 DIAGNOSIS — G608 Other hereditary and idiopathic neuropathies: Secondary | ICD-10-CM | POA: Insufficient documentation

## 2012-07-09 DIAGNOSIS — M47817 Spondylosis without myelopathy or radiculopathy, lumbosacral region: Secondary | ICD-10-CM | POA: Insufficient documentation

## 2012-07-09 DIAGNOSIS — M5137 Other intervertebral disc degeneration, lumbosacral region: Secondary | ICD-10-CM

## 2012-07-09 MED ORDER — HYDROCODONE-ACETAMINOPHEN 7.5-325 MG PO TABS: 1.0000 | ORAL_TABLET | Freq: Three times a day (TID) | ORAL | Status: DC | PRN

## 2012-07-09 NOTE — Patient Instructions (Signed)
Continue with loosing weight, continue with the stationary bike and some strengthening exercises as pain permits.

## 2012-07-09 NOTE — Progress Notes (Signed)
Subjective:    Patient ID: Tim [name], male    DOB: February 09, 1941, 72 y.o.   MRN: [id number]  HPI Tim [name] returns today, he has a history of a hereditary peripheral  neuropathy as well as lumbar spondylosis. He has responded x1 to S1  transforaminal injection, however, subsequent repeats were not  successful. He is not doing his core strengthening exercises, because they would aggrevate his back pain.  The patient has undergone right shoulder surgery in February. He states, that he is doing much better concerning his PNP, his back pain has been stable. He would like to taper down his gabapentin.  Pain Inventory Average Pain 2 Pain Right Now 1 My pain is intermittent, sharp and aching  In the last 24 hours, has pain interfered with the following? General activity 7 Relation with others 7 Enjoyment of life 6 What TIME of day is your pain at its worst? all the time Sleep (in general) Fair  Pain is worse with: walking, standing and some activites Pain improves with: medication Relief from Meds: 7  Mobility walk without assistance use a cane how many minutes can you walk? 5 ability to climb steps?  yes do you drive?  yes Do you have any goals in this area?  yes  Function retired I need assistance with the following:  meal prep, household duties and shopping  Neuro/Psych weakness numbness trouble walking dizziness  Prior Studies Any changes since last visit?  no  Physicians involved in your care Any changes since last visit?  no   Family History  Problem Relation Age of Onset  . Hypertension Mother   . Coronary artery disease Mother   . Heart attack Mother   . Pulmonary fibrosis Father    History   Social History  . Marital Status: Married    Spouse Name: [name]    Number of Children: 0  . Years of Education: 20   Occupational History  . engineering     retired   Social History Main Topics  . Smoking status: Never Smoker   . Smokeless tobacco:  Never Used  . Alcohol Use: No  . Drug Use: Yes    Special: Oxycodone  . Sexually Active: Not Currently   Other Topics Concern  . None   Social History Narrative   HSG, [name] - BS, [name] - MS-engineering, 2 years on PhD - [name]. Married - '65 - 63yr/divorced; '76- 3 yrs/divorced; '92 . No children. 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 ACP/Living Will - Yes CPR; long-term Mechanical ventilation as long as he was able to cognate; ok for long term artificial nutrition. Precondition being able to cognate and not to have too much pain.    Past Surgical History  Procedure Date  . Hemorrhoid surgery   . Vitrectomy   . Intraocular lens insertion   . Leg surgery   . Knee surgery   . Traeculectomy   . Shoulder arthroscopy 08/30/2011    Procedure: ARTHROSCOPY SHOULDER;  Surgeon: [name] MD;  Location: [facility]  Service: Orthopedics;  Laterality: Right;  Right Shoulder Arthroscopy, Debridement, and Decompression   Past Medical History  Diagnosis Date  . HYPERCHOLESTEROLEMIA-PURE   . GOUT   . OBSTRUCTIVE SLEEP APNEA   . Essential hypertension, benign   . VENOUS INSUFFICIENCY   . ASTHMA   . COPD   . VENTRAL HERNIA   . OSTEOARTHRITIS   . CATARACT, HX OF   . PULMONARY  HYPERTENSION, HX OF   . Hallux valgus (acquired)   . Obesity, unspecified   . Osteoarthrosis, unspecified whether generalized or localized, unspecified site   . Unspecified glaucoma(365.9)   . Heart murmur   . Atrial fibrillation   . Lumbosacral neuritis   . Unspecified hereditary and idiopathic peripheral neuropathy   . Lumbosacral spondylosis    BP 163/75  Pulse 62  Resp 14  Ht 6' 1"  (1.854 m)  Wt 352 lb 9.6 oz (159.938 kg)  BMI 46.52 kg/m2  SpO2 95%    Review of Systems  Musculoskeletal: Positive for myalgias, back pain, arthralgias and gait problem.  Neurological: Positive for dizziness, weakness and numbness.  All other systems reviewed and are  negative.       Objective:   Physical Exam Constitutional: He is oriented to person, place, and time. He appears well-developed.  Obese walks with a cane  HENT:  Head: Normocephalic.  Neck: Neck supple.  Musculoskeletal: He exhibits tenderness.  Neurological: He is alert and oriented to person, place, and time.  Skin: Skin is warm and dry.  Psychiatric: He has a normal mood and affect.  Symmetric normal motor tone is noted throughout. Normal muscle bulk. Muscle testing reveals 5/5 muscle strength of the upper extremity, and 5/5 of the lower extremity. Full range of motion in upper and lower extremities. ROM of spine is restricted.  DTR in the upper and lower extremity are present and symmetric 2+. No clonus is noted.  Patient arises from chair with difficulty. Wide based gait with a cane.         Assessment & Plan:  This is a 72 year old male with  1.DDD L-spine  2.Low back pain  3 s/p Right shoulder surgery  4. PNP, showed patient some exercises to improve balance. Plan :  Advised patient to strengthen his lower extremity muscles by riding the stationary bike, until he's able to walk again. The open sore on his left tibia has healed , the one between his toes is still open, treated by Dr. Jeanett Schlein, and therefore cannot walk right now.Follows up wound care clinic for this.   Refilled his hydrocodone 7.5 # 60, he still has enough of his Oxycodone 10 , for severe breakthrough pain, which he uses very sparingly.  Patient wants to taper down his Gabapentin if possible, I advised him to start with taking the medication bid for 1 week, and then wean further down if tolerated, by 1 tablet less per week, next the tablet at am, and if at all d/c after another week. Follow up in 3 month.

## 2012-07-18 ENCOUNTER — Ambulatory Visit (INDEPENDENT_AMBULATORY_CARE_PROVIDER_SITE_OTHER): Payer: Medicare Other | Admitting: *Deleted

## 2012-07-18 DIAGNOSIS — I4891 Unspecified atrial fibrillation: Secondary | ICD-10-CM

## 2012-07-18 DIAGNOSIS — Z7901 Long term (current) use of anticoagulants: Secondary | ICD-10-CM

## 2012-07-23 ENCOUNTER — Other Ambulatory Visit: Payer: Self-pay | Admitting: Physical Medicine & Rehabilitation

## 2012-07-31 ENCOUNTER — Other Ambulatory Visit: Payer: Self-pay | Admitting: Cardiology

## 2012-08-01 ENCOUNTER — Ambulatory Visit (INDEPENDENT_AMBULATORY_CARE_PROVIDER_SITE_OTHER): Payer: Medicare Other | Admitting: *Deleted

## 2012-08-01 DIAGNOSIS — I4891 Unspecified atrial fibrillation: Secondary | ICD-10-CM

## 2012-08-01 DIAGNOSIS — Z7901 Long term (current) use of anticoagulants: Secondary | ICD-10-CM

## 2012-08-01 LAB — POCT INR: INR: 3.6

## 2012-08-22 ENCOUNTER — Ambulatory Visit (INDEPENDENT_AMBULATORY_CARE_PROVIDER_SITE_OTHER): Payer: Medicare Other | Admitting: *Deleted

## 2012-08-22 DIAGNOSIS — Z7901 Long term (current) use of anticoagulants: Secondary | ICD-10-CM

## 2012-08-22 DIAGNOSIS — I4891 Unspecified atrial fibrillation: Secondary | ICD-10-CM

## 2012-08-22 LAB — POCT INR: INR: 3.7

## 2012-08-26 ENCOUNTER — Other Ambulatory Visit: Payer: Self-pay | Admitting: Internal Medicine

## 2012-08-26 ENCOUNTER — Other Ambulatory Visit: Payer: Self-pay | Admitting: Physical Medicine and Rehabilitation

## 2012-08-26 ENCOUNTER — Other Ambulatory Visit: Payer: Self-pay | Admitting: Physical Medicine & Rehabilitation

## 2012-08-26 NOTE — Telephone Encounter (Signed)
Med filled.  

## 2012-09-04 ENCOUNTER — Encounter: Payer: Self-pay | Admitting: Internal Medicine

## 2012-09-04 ENCOUNTER — Ambulatory Visit (INDEPENDENT_AMBULATORY_CARE_PROVIDER_SITE_OTHER): Payer: Medicare Other | Admitting: Internal Medicine

## 2012-09-04 VITALS — BP 132/64 | HR 74 | Temp 98.0°F | Ht 73.0 in | Wt 325.5 lb

## 2012-09-04 DIAGNOSIS — R198 Other specified symptoms and signs involving the digestive system and abdomen: Secondary | ICD-10-CM

## 2012-09-04 NOTE — Progress Notes (Signed)
Subjective:    Patient ID: Tim [name], male    DOB: 04/23/1941, 72 y.o.   MRN: [id number]  HPI  Pt presents to the clinic today with c/o alternating constipation and diarrhea. This started 1-2 months ago. He gets in a cycle where he has diarrhea for 3-4 days, takes imodium, he has normal BM's for 2 days and then battles constipation for 3-4, in which he takes fiber supplements. He has no real abdominal pain with this. He has not seen any blood in his stool. He has not had any nausea or vomiting. He has never been told that he has diverticulosis. He has had a normal colonoscopy in the past. He is overdue for his colon screening.  Review of Systems      Past Medical History  Diagnosis Date  . HYPERCHOLESTEROLEMIA-PURE   . GOUT   . OBSTRUCTIVE SLEEP APNEA   . Essential hypertension, benign   . VENOUS INSUFFICIENCY   . ASTHMA   . COPD   . VENTRAL HERNIA   . OSTEOARTHRITIS   . CATARACT, HX OF   . PULMONARY HYPERTENSION, HX OF   . Hallux valgus (acquired)   . Obesity, unspecified   . Osteoarthrosis, unspecified whether generalized or localized, unspecified site   . Unspecified glaucoma(365.9)   . Heart murmur   . Atrial fibrillation   . Lumbosacral neuritis   . Unspecified hereditary and idiopathic peripheral neuropathy   . Lumbosacral spondylosis     Current Outpatient Prescriptions  Medication Sig Dispense Refill  . ADVAIR DISKUS 250-50 MCG/DOSE AEPB inhale 1 dose by mouth twice a day  180 each  0  . allopurinol (ZYLOPRIM) 300 MG tablet Take 1 tablet (300 mg total) by mouth daily.  30 tablet  1  . B Complex-C (B-COMPLEX WITH VITAMIN C) tablet Take 1 tablet by mouth daily.      . CRESTOR 40 MG tablet take 1 tablet by mouth once daily  90 each  4  . finasteride (PROSCAR) 5 MG tablet Take 1 tablet (5 mg total) by mouth daily.  30 tablet  5  . fluconazole (DIFLUCAN) 100 MG tablet Take 1 tablet (100 mg total) by mouth daily.  14 tablet  2  . fluticasone (FLONASE) 50 MCG/ACT  nasal spray 2 sprays by Nasal route as needed.        . furosemide (LASIX) 40 MG tablet take 1 tablet by mouth twice a day  60 tablet  2  . gabapentin (NEURONTIN) 400 MG capsule take 1 capsule by mouth three times a day  90 capsule  2  . HYDROcodone-acetaminophen (NORCO) 7.5-325 MG per tablet take 1 tablet by mouth every 8 hours if needed for pain  60 tablet  0  . losartan (COZAAR) 50 MG tablet Take 1 tablet (50 mg total) by mouth 2 (two) times daily.  180 tablet  3  . metoprolol (TOPROL-XL) 200 MG 24 hr tablet Take 2 tablets by mouth daily.  60 tablet  3  . Multiple Vitamin (MULTIVITAMIN) tablet Take 1 tablet by mouth daily.        [name] NIASPAN 1000 MG CR tablet TAKE 1 TABLET BY MOUTH TWICE A DAY  180 tablet  2  . OMEGA 3 1200 MG CAPS Take 2,400 mg by mouth 2 (two) times daily.      [name] omeprazole (PRILOSEC) 10 MG capsule Take 1 capsule (10 mg total) by mouth daily.  30 capsule  11  . oxyCODONE-acetaminophen (PERCOCET) 10-325 MG per tablet  Take 1 tablet by mouth every 8 (eight) hours as needed.  30 tablet  0  . tiotropium (SPIRIVA) 18 MCG inhalation capsule Place 18 mcg into inhaler and inhale daily.        . traMADol-acetaminophen (ULTRACET) 37.5-325 MG per tablet TAKE 1 TABLET BY MOUTH EVERY 6 HOURS AS NEEDED  120 tablet  2  . vitamin E 200 UNIT capsule Take 200 Units by mouth daily.        [name] warfarin (COUMADIN) 5 MG tablet take as directed  30 tablet  3  . ZETIA 10 MG tablet take 1 tablet by mouth once daily  30 each  12   No current facility-administered medications for this visit.    Allergies  Allergen Reactions  . Brimonidine Tartrate     REACTION: sob  . Brinzolamide     REACTION: sob  . Celecoxib     REACTION: confusion  . Codeine     REACTION: unk  . Colchicine     diarrhea  . Diltiazem     REACTION: leg swelling  . Latanoprost     REACTION: sob  . Rofecoxib     REACTION: leg swelling  . Timolol Maleate     REACTION: asthma exaserbation  . Vancomycin     REACTION:  hives/blisters    Family History  Problem Relation Age of Onset  . Hypertension Mother   . Coronary artery disease Mother   . Heart attack Mother   . Pulmonary fibrosis Father     History   Social History  . Marital Status: Married    Spouse Name: [name]    Number of Children: 0  . Years of Education: 20   Occupational History  . engineering     retired   Social History Main Topics  . Smoking status: Never Smoker   . Smokeless tobacco: Never Used  . Alcohol Use: No  . Drug Use: Yes    Special: Oxycodone  . Sexually Active: Not Currently   Other Topics Concern  . Not on file   Social History Narrative   HSG, [name] - BS, [name] - MS-engineering, 2 years on PhD - [employer]. Married - '65 - 57yr/divorced; '76- 3 yrs/divorced; '92 . No children. 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 ACP/Living Will - Yes CPR; long-term Mechanical ventilation as long as he was able to cognate; ok for long term artificial nutrition. Precondition being able to cognate and not to have too much pain.      Constitutional: Denies fever, malaise, fatigue, headache or abrupt weight changes. t.  Gastrointestinal: Pt reports constipation and diarrhea. Denies abdominal pain, bloating or blood in the stool.    No other specific complaints in a complete review of systems (except as listed in HPI above).  Objective:   Physical Exam  BP 132/64  Pulse 74  Temp(Src) 98 F (36.7 C) (Oral)  Ht 6' 1"  (1.854 m)  Wt 325 lb 8 oz (147.646 kg)  BMI 42.95 kg/m2  SpO2 96% Wt Readings from Last 3 Encounters:  09/04/12 325 lb 8 oz (147.646 kg)  07/09/12 352 lb 9.6 oz (159.938 kg)  06/05/12 353 lb 1.3 oz (160.156 kg)    General: Appears his stated age, obese but well developed, well nourished in NAD.  Cardiovascular: Normal rate and rhythm. S1,S2 noted.  No murmur, rubs or gallops noted. No JVD or BLE edema. No carotid bruits noted. Pulmonary/Chest: Normal effort and  positive vesicular breath  sounds. No respiratory distress. No wheezes, rales or ronchi noted.  Abdomen: Soft and nontender. Normal bowel sounds, no bruits noted. No distention or masses noted. Liver, spleen and kidneys non palpable.  BMET    Component Value Date/Time   NA 140 06/05/2012 1440   K 4.2 06/05/2012 1440   CL 101 06/05/2012 1440   CO2 32 06/05/2012 1440   GLUCOSE 127* 06/05/2012 1440   BUN 14 06/05/2012 1440   CREATININE 0.8 06/05/2012 1440   CALCIUM 9.3 06/05/2012 1440   GFRNONAA >90 08/25/2011 1556   GFRAA >90 08/25/2011 1556    Lipid Panel     Component Value Date/Time   CHOL 108 06/05/2012 1440   TRIG 94.0 06/05/2012 1440   HDL 52.30 06/05/2012 1440   CHOLHDL 2 06/05/2012 1440   VLDL 18.8 06/05/2012 1440   LDLCALC 37 06/05/2012 1440    CBC    Component Value Date/Time   WBC 6.1 08/25/2011 1556   RBC 4.15* 08/25/2011 1556   HGB 12.7* 08/25/2011 1556   HCT 36.6* 08/25/2011 1556   PLT 147* 08/25/2011 1556   MCV 88.2 08/25/2011 1556   MCH 30.6 08/25/2011 1556   MCHC 34.7 08/25/2011 1556   RDW 13.9 08/25/2011 1556   LYMPHSABS 1.7 01/09/2008 1706   MONOABS 0.7 01/09/2008 1706   EOSABS 0.1 01/09/2008 1706   BASOSABS 0.0 01/09/2008 1706    Hgb A1C Lab Results  Component Value Date   HGBA1C 5.8 06/05/2012         Assessment & Plan:   Alternating constipation and diarrhea, new onset with additional workup required:  Will refer to GI for repeat colonoscopy Pt insistent that this is not related to antidiarrheal over use. He states this occurred prior to using the imodium. Continue your fiber until you see GI  RTC if you develop sever abdominal pain or notice that you have blood  In your stool

## 2012-09-04 NOTE — Patient Instructions (Signed)
Constipation, Adult Constipation is when a person has fewer than 3 bowel movements a week; has difficulty having a bowel movement; or has stools that are dry, hard, or larger than normal. As people grow older, constipation is more common. If you try to fix constipation with medicines that make you have a bowel movement (laxatives), the problem may get worse. Long-term laxative use may cause the muscles of the colon to become weak. A low-fiber diet, not taking in enough fluids, and taking certain medicines may make constipation worse. CAUSES   Certain medicines, such as antidepressants, pain medicine, iron supplements, antacids, and water pills.   Certain diseases, such as diabetes, irritable bowel syndrome (IBS), thyroid disease, or depression.   Not drinking enough water.   Not eating enough fiber-rich foods.   Stress or travel.  Lack of physical activity or exercise.  Not going to the restroom when there is the urge to have a bowel movement.  Ignoring the urge to have a bowel movement.  Using laxatives too much. SYMPTOMS   Having fewer than 3 bowel movements a week.   Straining to have a bowel movement.   Having hard, dry, or larger than normal stools.   Feeling full or bloated.   Pain in the lower abdomen.  Not feeling relief after having a bowel movement. DIAGNOSIS  Your caregiver will take a medical history and perform a physical exam. Further testing may be done for severe constipation. Some tests may include:   A barium enema X-ray to examine your rectum, colon, and sometimes, your small intestine.  A sigmoidoscopy to examine your lower colon.  A colonoscopy to examine your entire colon. TREATMENT  Treatment will depend on the severity of your constipation and what is causing it. Some dietary treatments include drinking more fluids and eating more fiber-rich foods. Lifestyle treatments may include regular exercise. If these diet and lifestyle recommendations  do not help, your caregiver may recommend taking over-the-counter laxative medicines to help you have bowel movements. Prescription medicines may be prescribed if over-the-counter medicines do not work.  HOME CARE INSTRUCTIONS   Increase dietary fiber in your diet, such as fruits, vegetables, whole grains, and beans. Limit high-fat and processed sugars in your diet, such as [place] fries, hamburgers, cookies, candies, and soda.   A fiber supplement may be added to your diet if you cannot get enough fiber from foods.   Drink enough fluids to keep your urine clear or pale yellow.   Exercise regularly or as directed by your caregiver.   Go to the restroom when you have the urge to go. Do not hold it.  Only take medicines as directed by your caregiver. Do not take other medicines for constipation without talking to your caregiver first. [place] IF:   You have bright red blood in your stool.   Your constipation lasts for more than 4 days or gets worse.   You have abdominal or rectal pain.   You have thin, pencil-like stools.  You have unexplained weight loss. MAKE SURE YOU:   Understand these instructions.  Will watch your condition.  Will get help right away if you are not doing well or get worse. Document Released: 03/17/2004 Document Revised: 09/11/2011 Document Reviewed: 05/23/2011 [facility] Patient Information 2013 [place]. Constipation, Adult Constipation is when a person has fewer than 3 bowel movements a week; has difficulty having a bowel movement; or has stools that are dry, hard, or larger than normal. As people grow older,  constipation is more common. If you try to fix constipation with medicines that make you have a bowel movement (laxatives), the problem may get worse. Long-term laxative use may cause the muscles of the colon to become weak. A low-fiber diet, not taking in enough fluids, and taking certain medicines may make constipation  worse. CAUSES   Certain medicines, such as antidepressants, pain medicine, iron supplements, antacids, and water pills.   Certain diseases, such as diabetes, irritable bowel syndrome (IBS), thyroid disease, or depression.   Not drinking enough water.   Not eating enough fiber-rich foods.   Stress or travel.  Lack of physical activity or exercise.  Not going to the restroom when there is the urge to have a bowel movement.  Ignoring the urge to have a bowel movement.  Using laxatives too much. SYMPTOMS   Having fewer than 3 bowel movements a week.   Straining to have a bowel movement.   Having hard, dry, or larger than normal stools.   Feeling full or bloated.   Pain in the lower abdomen.  Not feeling relief after having a bowel movement. DIAGNOSIS  Your caregiver will take a medical history and perform a physical exam. Further testing may be done for severe constipation. Some tests may include:   A barium enema X-ray to examine your rectum, colon, and sometimes, your small intestine.  A sigmoidoscopy to examine your lower colon.  A colonoscopy to examine your entire colon. TREATMENT  Treatment will depend on the severity of your constipation and what is causing it. Some dietary treatments include drinking more fluids and eating more fiber-rich foods. Lifestyle treatments may include regular exercise. If these diet and lifestyle recommendations do not help, your caregiver may recommend taking over-the-counter laxative medicines to help you have bowel movements. Prescription medicines may be prescribed if over-the-counter medicines do not work.  HOME CARE INSTRUCTIONS   Increase dietary fiber in your diet, such as fruits, vegetables, whole grains, and beans. Limit high-fat and processed sugars in your diet, such as [place] fries, hamburgers, cookies, candies, and soda.   A fiber supplement may be added to your diet if you cannot get enough fiber from foods.    Drink enough fluids to keep your urine clear or pale yellow.   Exercise regularly or as directed by your caregiver.   Go to the restroom when you have the urge to go. Do not hold it.  Only take medicines as directed by your caregiver. Do not take other medicines for constipation without talking to your caregiver first. [place] IF:   You have bright red blood in your stool.   Your constipation lasts for more than 4 days or gets worse.   You have abdominal or rectal pain.   You have thin, pencil-like stools.  You have unexplained weight loss. MAKE SURE YOU:   Understand these instructions.  Will watch your condition.  Will get help right away if you are not doing well or get worse. Document Released: 03/17/2004 Document Revised: 09/11/2011 Document Reviewed: 05/23/2011 [name] Patient Information 2013 [place]. Diarrhea Infections caused by germs (bacterial) or a virus commonly cause diarrhea. Your caregiver has determined that with time, rest and fluids, the diarrhea should improve. In general, eat normally while drinking more water than usual. Although water may prevent dehydration, it does not contain salt and minerals (electrolytes). Broths, weak tea without caffeine and oral rehydration solutions (ORS) replace fluids and electrolytes. Small amounts of fluids should be taken frequently.  Large amounts at one time may not be tolerated. Plain water may be harmful in infants and the elderly. Oral rehydrating solutions (ORS) are available at pharmacies and grocery stores. ORS replace water and important electrolytes in proper proportions. Sports drinks are not as effective as ORS and may be harmful due to sugars worsening diarrhea.  ORS is especially recommended for use in children with diarrhea. As a general guideline for children, replace any new fluid losses from diarrhea and/or vomiting with ORS as follows:  If your child weighs 22 pounds or  under (10 kg or less), give 60-120 mL ( -  cup or 2 - 4 ounces) of ORS for each episode of diarrheal stool or vomiting episode.  If your child weighs more than 22 pounds (more than 10 kgs), give 120-240 mL ( - 1 cup or 4 - 8 ounces) of ORS for each diarrheal stool or episode of vomiting.  While correcting for dehydration, children should eat normally. However, foods high in sugar should be avoided because this may worsen diarrhea. Large amounts of carbonated soft drinks, juice, gelatin desserts and other highly sugared drinks should be avoided.  After correction of dehydration, other liquids that are appealing to the child may be added. Children should drink small amounts of fluids frequently and fluids should be increased as tolerated. Children should drink enough fluids to keep urine clear or pale yellow.  Adults should eat normally while drinking more fluids than usual. Drink small amounts of fluids frequently and increase as tolerated. Drink enough fluids to keep urine clear or pale yellow. Broths, weak decaffeinated tea, lemon lime soft drinks (allowed to go flat) and ORS replace fluids and electrolytes.  Avoid:  Carbonated drinks.  Juice.  Extremely hot or cold fluids.  Caffeine drinks.  Fatty, greasy foods.  Alcohol.  Tobacco.  Too much intake of anything at one time.  Gelatin desserts.  Probiotics are active cultures of beneficial bacteria. They may lessen the amount and number of diarrheal stools in adults. Probiotics can be found in yogurt with active cultures and in supplements.  Wash hands well to avoid spreading bacteria and virus.  Anti-diarrheal medications are not recommended for infants and children.  Only take over-the-counter or prescription medicines for pain, discomfort or fever as directed by your caregiver. Do not give aspirin to children because it may cause Reye's Syndrome.  For adults, ask your caregiver if you should continue all prescribed and  over-the-counter medicines.  If your caregiver has given you a follow-up appointment, it is very important to keep that appointment. Not keeping the appointment could result in a chronic or permanent injury, and disability. If there is any problem keeping the appointment, you must call back to this facility for assistance. SEEK IMMEDIATE MEDICAL CARE IF:   You or your child is unable to keep fluids down or other symptoms or problems become worse in spite of treatment.  Vomiting or diarrhea develops and becomes persistent.  There is vomiting of blood or bile (green material).  There is blood in the stool or the stools are black and tarry.  There is no urine output in 6-8 hours or there is only a small amount of very dark urine.  Abdominal pain develops, increases or localizes.  You have a fever.  Your baby is older than 3 months with a rectal temperature of 102 F (38.9 C) or higher.  Your baby is 62 months old or younger with a rectal temperature of 100.4 F (38 C)  or higher.  You or your child develops excessive weakness, dizziness, fainting or extreme thirst.  You or your child develops a rash, stiff neck, severe headache or become irritable or sleepy and difficult to awaken. MAKE SURE YOU:   Understand these instructions.  Will watch your condition.  Will get help right away if you are not doing well or get worse. Document Released: 06/09/2002 Document Revised: 09/11/2011 Document Reviewed: 04/26/2009 [facility] Patient Information 2013 [place].

## 2012-09-05 ENCOUNTER — Ambulatory Visit (INDEPENDENT_AMBULATORY_CARE_PROVIDER_SITE_OTHER): Payer: Medicare Other

## 2012-09-05 DIAGNOSIS — I4891 Unspecified atrial fibrillation: Secondary | ICD-10-CM

## 2012-09-05 DIAGNOSIS — Z7901 Long term (current) use of anticoagulants: Secondary | ICD-10-CM

## 2012-09-12 ENCOUNTER — Encounter: Payer: Self-pay | Admitting: Internal Medicine

## 2012-09-26 ENCOUNTER — Ambulatory Visit (INDEPENDENT_AMBULATORY_CARE_PROVIDER_SITE_OTHER): Payer: Medicare Other | Admitting: *Deleted

## 2012-09-26 DIAGNOSIS — I4891 Unspecified atrial fibrillation: Secondary | ICD-10-CM

## 2012-09-26 DIAGNOSIS — Z7901 Long term (current) use of anticoagulants: Secondary | ICD-10-CM

## 2012-09-26 LAB — POCT INR: INR: 3.2

## 2012-10-04 ENCOUNTER — Other Ambulatory Visit: Payer: Self-pay | Admitting: Cardiology

## 2012-10-04 ENCOUNTER — Other Ambulatory Visit: Payer: Self-pay | Admitting: Physical Medicine and Rehabilitation

## 2012-10-04 ENCOUNTER — Telehealth: Payer: Self-pay | Admitting: *Deleted

## 2012-10-04 ENCOUNTER — Ambulatory Visit (INDEPENDENT_AMBULATORY_CARE_PROVIDER_SITE_OTHER): Payer: Medicare Other | Admitting: Internal Medicine

## 2012-10-04 ENCOUNTER — Other Ambulatory Visit: Payer: Self-pay | Admitting: Internal Medicine

## 2012-10-04 ENCOUNTER — Encounter: Payer: Self-pay | Admitting: Internal Medicine

## 2012-10-04 VITALS — BP 134/68 | HR 82 | Ht 73.0 in | Wt 353.0 lb

## 2012-10-04 DIAGNOSIS — Z7901 Long term (current) use of anticoagulants: Secondary | ICD-10-CM

## 2012-10-04 DIAGNOSIS — I4891 Unspecified atrial fibrillation: Secondary | ICD-10-CM

## 2012-10-04 DIAGNOSIS — R194 Change in bowel habit: Secondary | ICD-10-CM

## 2012-10-04 DIAGNOSIS — R198 Other specified symptoms and signs involving the digestive system and abdomen: Secondary | ICD-10-CM

## 2012-10-04 MED ORDER — NA SULFATE-K SULFATE-MG SULF 17.5-3.13-1.6 GM/177ML PO SOLN: 1.0000 | ORAL | Status: DC

## 2012-10-04 NOTE — Patient Instructions (Addendum)
You have been scheduled for a colonoscopy. Please follow written instructions given to you at your visit today.  Please pick up your prep kit at the pharmacy within the next 1-3 days. If you use inhalers (even only as needed), please bring them with you on the day of your procedure.  You will be contaced by our office prior to your procedure for directions on holding your Coumadin/Warfarin.  If you do not hear from our office 1 week prior to your scheduled procedure, please call [phone] to discuss.  Thank you for choosing me and [facility].  [name], M.D., [facility]

## 2012-10-04 NOTE — Telephone Encounter (Signed)
10/04/2012 RE: EMMONS TOTH DOB: [birth year] MRN: [id number]  Dear Dr Stanford Breed,   We have scheduled the above patient for a colonoscopy procedure. Our records show that he is on anticoagulation therapy.   Please advise as to whether the patient may come off his therapy of Coumadin 5 days  prior to the procedure, which is scheduled for 10/30/12.  Please route back to Patti Martinique, [place].   Sincerely,  Patti Martinique

## 2012-10-04 NOTE — Progress Notes (Signed)
  Subjective:    Patient ID: Tim [name], male    DOB: 06-16-1941, 73 y.o.   MRN: [id number]  HPI This elderly wm presents w// a hx of diarrhea more than constipation that began in early 2014. It worsened and then improved. Would go several days with diarrhea and then not move bowels at times. Was regular prior to this. One time he had rectal bleeding with large hard stool.  He is intentionally losing weight by report.  Medications, allergies, past medical history, past surgical history, family history and social history are reviewed and updated in the EMR.    Review of Systems Recent URI with rhinorrhea and cough, weakness - improved but not yet resolved     Objective:   Physical Exam General:  Elderly, obese and in no acute distress Eyes:  anicteric. ENT:   Mouth and posterior pharynx free of lesions.  Neck:   supple w/o thyromegaly or mass.  Lungs: Clear to auscultation bilaterally. Heart:  S1S2, 2/6 HSM murmur,no gallops. Abdomen:  obese soft, non-tender, BS+.  Rectal: deferred Lymph:  no cervical or supraclavicular adenopathy. Extremities:   Brawny edema both LE's with brown pigmentation Skin   see ext, also intertrigo. Neuro:  A&O x 3.  Psych:  appropriate mood and  Affect.   Data Reviewed: Previous EGD PCP note 2007 colonoscopy Dr. Earlean Shawl - hemorrhoid injection and diverticulosis      Assessment & Plan:  Change in bowel habits  Warfarin anticoagulation  Atrial fibrillation  1. Schedule colonoscopy to evaluate 2. Needs to hold warfarin - will doublecheck w/ cardilology for ok -  The risks and benefits as well as alternatives of endoscopic procedure(s) have been discussed and reviewed. All questions answered. The patient agrees to proceed. Increased but rare risk of stroke off warfarin explained  TT:[id number] Norins, MD

## 2012-10-06 NOTE — Telephone Encounter (Signed)
Ok to hold coumadin 5 days prior to procedure and resume after. Kirk Ruths

## 2012-10-07 ENCOUNTER — Encounter: Payer: Self-pay | Admitting: Internal Medicine

## 2012-10-07 NOTE — Telephone Encounter (Signed)
Hydrocodone refilled per electronic request from pharmacy. Phoned in to pharmacy. Has an appt 10/08/12 with Santiago Glad.

## 2012-10-07 NOTE — Telephone Encounter (Signed)
LM for patient to call me back.

## 2012-10-08 ENCOUNTER — Encounter
Payer: Medicare Other | Attending: Physical Medicine and Rehabilitation | Admitting: Physical Medicine and Rehabilitation

## 2012-10-08 ENCOUNTER — Encounter: Payer: Self-pay | Admitting: Physical Medicine and Rehabilitation

## 2012-10-08 VITALS — BP 146/75 | HR 63 | Resp 14 | Ht 73.0 in | Wt 354.0 lb

## 2012-10-08 DIAGNOSIS — Z5181 Encounter for therapeutic drug level monitoring: Secondary | ICD-10-CM

## 2012-10-08 DIAGNOSIS — M545 Low back pain, unspecified: Secondary | ICD-10-CM

## 2012-10-08 DIAGNOSIS — Z79899 Other long term (current) drug therapy: Secondary | ICD-10-CM

## 2012-10-08 DIAGNOSIS — G8929 Other chronic pain: Secondary | ICD-10-CM

## 2012-10-08 DIAGNOSIS — M51379 Other intervertebral disc degeneration, lumbosacral region without mention of lumbar back pain or lower extremity pain: Secondary | ICD-10-CM

## 2012-10-08 DIAGNOSIS — M5137 Other intervertebral disc degeneration, lumbosacral region: Secondary | ICD-10-CM

## 2012-10-08 MED ORDER — OXYCODONE-ACETAMINOPHEN 10-325 MG PO TABS: 1.0000 | ORAL_TABLET | Freq: Three times a day (TID) | ORAL | Status: DC | PRN

## 2012-10-08 NOTE — Telephone Encounter (Signed)
Patient informed and verbalized understanding to hold coumadin 5 days prior to colonoscopy, Approved by Dr. Stanford Breed.

## 2012-10-08 NOTE — Progress Notes (Signed)
Subjective:    Patient ID: Tim [name], male    DOB: 1940-07-23, 72 y.o.   MRN: [id number]  HPI Tim [name] returns today, he has a history of a hereditary peripheral neuropathy as well as lumbar spondylosis. He has responded x1 to S1 transforaminal injection, however, subsequent repeats were not successful.  He is not doing his core strengthening exercises, because they would aggrevate his back pain.    He states, that he is doing much better concerning his PNP, also his back pain has been stable.  Pain Inventory Average Pain 7 Pain Right Now 1 My pain is intermittent, sharp and stabbing  In the last 24 hours, has pain interfered with the following? General activity 7 Relation with others 7 Enjoyment of life 5 What TIME of day is your pain at its worst? night Sleep (in general) Good  Pain is worse with: walking, bending and standing Pain improves with: rest, pacing activities and medication Relief from Meds: 7  Mobility walk without assistance how many minutes can you walk? 5 ability to climb steps?  yes do you drive?  yes  Function retired  Neuro/Psych bowel control problems numbness tingling trouble walking  Prior Studies Any changes since last visit?  no  Physicians involved in your care Any changes since last visit?  no   Family History  Problem Relation Age of Onset  . Hypertension Mother   . Coronary artery disease Mother   . Heart attack Mother   . Pulmonary fibrosis Father     asbestosis   History   Social History  . Marital Status: Married    Spouse Name: [name]    Number of Children: 0  . Years of Education: 20   Occupational History  . engineering     retired   Social History Main Topics  . Smoking status: Never Smoker   . Smokeless tobacco: Never Used  . Alcohol Use: No  . Drug Use: Yes    Special: Oxycodone  . Sexually Active: Not Currently   Other Topics Concern  . None   Social History Narrative   HSG, [name] -  BS, [name] - MS-engineering, 2 years on PhD - [name]. Married - '65 - 41yr/divorced; '76- 3 yrs/divorced; '92 . No children. 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 ACP/Living Will - Yes CPR; long-term Mechanical ventilation as long as he was able to cognate; ok for long term artificial nutrition. Precondition being able to cognate and not to have too much pain.    Past Surgical History  Procedure Laterality Date  . Hemorrhoid surgery    . Vitrectomy    . Intraocular lens insertion Bilateral   . Leg surgery Bilateral   . Knee surgery Right   . Traeculectomy Left     eye  . Shoulder arthroscopy  08/30/2011    Procedure: ARTHROSCOPY SHOULDER;  Surgeon: [name] MD;  [place] [facility]  Service: Orthopedics;  Laterality: Right;  Right Shoulder Arthroscopy, Debridement, and Decompression  . Tonsillectomy    . Colonoscopy     Past Medical History  Diagnosis Date  . HYPERCHOLESTEROLEMIA-PURE   . GOUT   . OBSTRUCTIVE SLEEP APNEA   . Essential hypertension, benign   . VENOUS INSUFFICIENCY   . ASTHMA   . COPD   . VENTRAL HERNIA   . OSTEOARTHRITIS   . CATARACT, HX OF   . PULMONARY HYPERTENSION, HX OF   . Hallux valgus (acquired)   . Obesity,  unspecified   . Osteoarthrosis, unspecified whether generalized or localized, unspecified site   . Unspecified glaucoma(365.9)   . Heart murmur   . Atrial fibrillation   . Lumbosacral neuritis   . Unspecified hereditary and idiopathic peripheral neuropathy   . Lumbosacral spondylosis   . CAD (coronary artery disease)   . PUD (peptic ulcer disease)   . BPH (benign prostatic hypertrophy)   . HH (hiatus hernia)   . Glaucoma   . Narcolepsy    BP 146/75  Pulse 63  Resp 14  Ht 6' 1"  (1.854 m)  Wt 354 lb (160.573 kg)  BMI 46.71 kg/m2  SpO2 97%     Review of Systems  Gastrointestinal: Positive for diarrhea and constipation.  Musculoskeletal: Positive for back pain and gait problem.  Neurological: Positive  for numbness.  All other systems reviewed and are negative.       Objective:   Physical Exam Constitutional: He is oriented to person, place, and time. He appears well-developed.  Obese walks with a cane  HENT:  Head: Normocephalic.  Neck: Neck supple.  Musculoskeletal: He exhibits tenderness.  Neurological: He is alert and oriented to person, place, and time.  Skin: Skin is warm and dry.  Psychiatric: He has a normal mood and affect.  Symmetric normal motor tone is noted throughout. Normal muscle bulk. Muscle testing reveals 5/5 muscle strength of the upper extremity, and 5/5 of the lower extremity. Full range of motion in upper and lower extremities. ROM of spine is restricted.  DTR in the upper and lower extremity are present and symmetric 2+. No clonus is noted.  Patient arises from chair with difficulty. Wide based gait with a cane.         Assessment & Plan:  This is a 72 year old male with  1.DDD L-spine  2.Low back pain  3 s/p Right shoulder surgery , Nov. 2012 4. PNP, showed patient some exercises to improve balance.  Plan :  Advised patient to strengthen his lower extremity muscles by riding the stationary bike, and also to walk as tolerated.  Refilled his Oxycodone 58m, # 30 , for severe breakthrough pain, which he uses very sparingly.  Hydrocodone was refilled yesterday Patient is back on  his Gabapentin, he tried to taper it down, but then his foot pain was to severe. Follow up in 3 month.

## 2012-10-08 NOTE — Patient Instructions (Signed)
Continue with your exercise program, and with your walking as tolerated.

## 2012-10-17 ENCOUNTER — Ambulatory Visit (INDEPENDENT_AMBULATORY_CARE_PROVIDER_SITE_OTHER): Payer: Medicare Other

## 2012-10-17 DIAGNOSIS — I4891 Unspecified atrial fibrillation: Secondary | ICD-10-CM

## 2012-10-17 DIAGNOSIS — Z7901 Long term (current) use of anticoagulants: Secondary | ICD-10-CM

## 2012-10-24 ENCOUNTER — Other Ambulatory Visit: Payer: Self-pay

## 2012-10-24 MED ORDER — METOPROLOL SUCCINATE ER 200 MG PO TB24: ORAL_TABLET | ORAL | Status: DC

## 2012-10-30 ENCOUNTER — Encounter (HOSPITAL_COMMUNITY): Payer: Self-pay | Admitting: *Deleted

## 2012-10-30 ENCOUNTER — Encounter (HOSPITAL_COMMUNITY)
Admission: RE | Disposition: A | Discharge status: Home / Self Care | Payer: Self-pay | Source: Ambulatory Visit | Attending: Internal Medicine

## 2012-10-30 ENCOUNTER — Ambulatory Visit (HOSPITAL_COMMUNITY)
Admission: RE | Admit: 2012-10-30 | Discharge: 2012-10-30 | Disposition: A | Discharge status: Home / Self Care | Payer: Medicare Other | Source: Ambulatory Visit | Attending: Internal Medicine | Admitting: Internal Medicine

## 2012-10-30 DIAGNOSIS — R198 Other specified symptoms and signs involving the digestive system and abdomen: Secondary | ICD-10-CM

## 2012-10-30 DIAGNOSIS — K5730 Diverticulosis of large intestine without perforation or abscess without bleeding: Secondary | ICD-10-CM | POA: Diagnosis present

## 2012-10-30 DIAGNOSIS — K648 Other hemorrhoids: Secondary | ICD-10-CM | POA: Diagnosis present

## 2012-10-30 DIAGNOSIS — D126 Benign neoplasm of colon, unspecified: Secondary | ICD-10-CM

## 2012-10-30 DIAGNOSIS — R197 Diarrhea, unspecified: Secondary | ICD-10-CM | POA: Insufficient documentation

## 2012-10-30 DIAGNOSIS — R194 Change in bowel habit: Secondary | ICD-10-CM | POA: Diagnosis present

## 2012-10-30 DIAGNOSIS — I4891 Unspecified atrial fibrillation: Secondary | ICD-10-CM | POA: Insufficient documentation

## 2012-10-30 DIAGNOSIS — Z860100 Personal history of colon polyps, unspecified: Secondary | ICD-10-CM

## 2012-10-30 DIAGNOSIS — Z7901 Long term (current) use of anticoagulants: Secondary | ICD-10-CM | POA: Insufficient documentation

## 2012-10-30 DIAGNOSIS — E669 Obesity, unspecified: Secondary | ICD-10-CM | POA: Insufficient documentation

## 2012-10-30 DIAGNOSIS — Z6841 Body Mass Index (BMI) 40.0 and over, adult: Secondary | ICD-10-CM | POA: Insufficient documentation

## 2012-10-30 HISTORY — PX: COLONOSCOPY: SHX5424

## 2012-10-30 HISTORY — DX: Personal history of colon polyps, unspecified: Z86.0100

## 2012-10-30 HISTORY — DX: Personal history of colonic polyps: Z86.010

## 2012-10-30 HISTORY — DX: Gastro-esophageal reflux disease without esophagitis: K21.9

## 2012-10-30 SURGERY — COLONOSCOPY
Anesthesia: Moderate Sedation

## 2012-10-30 MED ORDER — FENTANYL CITRATE 0.05 MG/ML IJ SOLN
INTRAMUSCULAR | Status: DC | PRN
  Administered 2012-10-30 (×3): 25 ug via INTRAVENOUS

## 2012-10-30 MED ORDER — MIDAZOLAM HCL 10 MG/2ML IJ SOLN
INTRAMUSCULAR | Status: AC
  Filled 2012-10-30: qty 2

## 2012-10-30 MED ORDER — FENTANYL CITRATE 0.05 MG/ML IJ SOLN
INTRAMUSCULAR | Status: AC
  Filled 2012-10-30: qty 2

## 2012-10-30 MED ORDER — MIDAZOLAM HCL 5 MG/5ML IJ SOLN
INTRAMUSCULAR | Status: DC | PRN
  Administered 2012-10-30 (×3): 2 mg via INTRAVENOUS

## 2012-10-30 NOTE — Op Note (Signed)
[facility] [place], [id number]   COLONOSCOPY PROCEDURE REPORT  PATIENT: Tim [name], Tim [name].  MR#: [id number] BIRTHDATE: 1941-02-27 , 71  yrs. old GENDER: Male ENDOSCOPIST: [name], MD, [facility] PROCEDURE DATE:  10/30/2012 PROCEDURE:   Colonoscopy with biopsy ASA CLASS:   Class III INDICATIONS:Change in bowel habits. MEDICATIONS: Fentanyl 75 mcg IV and Versed 6 mg IV  DESCRIPTION OF PROCEDURE:   After the risks benefits and alternatives of the procedure were thoroughly explained, informed consent was obtained.  A digital rectal exam revealed no abnormalities of the rectum.   The     endoscope was introduced through the anus and advanced to the cecum, which was identified by both the appendix and ileocecal valve. No adverse events experienced.   The quality of the prep was Suprep adequate  The instrument was then slowly withdrawn as the colon was fully examined.      COLON FINDINGS: A diminutive polypoid shaped sessile polyp was found in the transverse colon.  A polypectomy was performed with cold forceps.  The resection was complete and the polyp tissue was completely retrieved.   Moderate diverticulosis was noted in the sigmoid colon.   Small internal hemorrhoids were found. Otherwise normal colonoscopy. Retroflexed views revealed internal hemorrhoids. The time to cecum=7 minutes 0 seconds.  Withdrawal time=17 minutes 0 seconds.  The scope was withdrawn and the procedure completed. COMPLICATIONS: There were no complications.  ENDOSCOPIC IMPRESSION: 1.   Diminutive sessile polyp was found in the transverse colon; polypectomy was performed with cold forceps 2.   Moderate diverticulosis was noted in the sigmoid colon 3.   Small internal hemorrhoids -otherwise normal exam, adequate prep.  RECOMMENDATIONS: 1.  High fiber diet 2.  Timing of repeat colonoscopy will be determined by pathology findings. 3.   Resume warfarin   eSigned:   [name], MD, [facility] 10/30/2012 10:23 AM  cc: Neena Rhymes, MD and The Patient

## 2012-10-30 NOTE — Interval H&P Note (Signed)
History and Physical Interval Note:  10/30/2012 9:10 AM  Tim [name]  has presented today for surgery, with the diagnosis of Bowel habit changes [787.99]  The various methods of treatment have been discussed with the patient and family. After consideration of risks, benefits and other options for treatment, the patient has consented to  Procedure(s): COLONOSCOPY ([name]) as a surgical intervention .  The patient's history has been reviewed, patient examined, no change in status, stable for surgery.  I have reviewed the patient's chart and labs.  Questions were answered to the patient's satisfaction.     Silvano Rusk

## 2012-10-30 NOTE — H&P (View-Only) (Signed)
  Subjective:    Patient ID: Tim [name], male    DOB: [birth year], 72 y.o.   MRN: [id number]  HPI This elderly wm presents w// a hx of diarrhea more than constipation that began in early 2014. It worsened and then improved. Would go several days with diarrhea and then not move bowels at times. Was regular prior to this. One time he had rectal bleeding with large hard stool.  He is intentionally losing weight by report.  Medications, allergies, past medical history, past surgical history, family history and social history are reviewed and updated in the EMR.    Review of Systems Recent URI with rhinorrhea and cough, weakness - improved but not yet resolved     Objective:   Physical Exam General:  Elderly, obese and in no acute distress Eyes:  anicteric. ENT:   Mouth and posterior pharynx free of lesions.  Neck:   supple w/o thyromegaly or mass.  Lungs: Clear to auscultation bilaterally. Heart:  S1S2, 2/6 HSM murmur,no gallops. Abdomen:  obese soft, non-tender, BS+.  Rectal: deferred Lymph:  no cervical or supraclavicular adenopathy. Extremities:   Brawny edema both LE's with brown pigmentation Skin   see ext, also intertrigo. Neuro:  A&O x 3.  Psych:  appropriate mood and  Affect.   Data Reviewed: Previous EGD PCP note 2007 colonoscopy Dr. Earlean Shawl - hemorrhoid injection and diverticulosis      Assessment & Plan:  Change in bowel habits  Warfarin anticoagulation  Atrial fibrillation  1. Schedule colonoscopy to evaluate 2. Needs to hold warfarin - will doublecheck w/ cardilology for ok -  The risks and benefits as well as alternatives of endoscopic procedure(s) have been discussed and reviewed. All questions answered. The patient agrees to proceed. Increased but rare risk of stroke off warfarin explained  AX:ENMMHWK Norins, MD

## 2012-10-31 ENCOUNTER — Encounter: Payer: Self-pay | Admitting: Internal Medicine

## 2012-10-31 ENCOUNTER — Encounter (HOSPITAL_COMMUNITY): Payer: Self-pay | Admitting: Internal Medicine

## 2012-10-31 NOTE — Progress Notes (Signed)
Quick Note:  Diminutive adenoma Consider repeat colonoscopy 2019 ______

## 2012-11-01 ENCOUNTER — Other Ambulatory Visit: Payer: Self-pay | Admitting: Physical Medicine and Rehabilitation

## 2012-11-01 ENCOUNTER — Other Ambulatory Visit: Payer: Self-pay | Admitting: *Deleted

## 2012-11-01 ENCOUNTER — Other Ambulatory Visit: Payer: Self-pay | Admitting: Internal Medicine

## 2012-11-01 MED ORDER — TRAMADOL-ACETAMINOPHEN 37.5-325 MG PO TABS: 1.0000 | ORAL_TABLET | Freq: Four times a day (QID) | ORAL | Status: DC | PRN

## 2012-11-07 ENCOUNTER — Ambulatory Visit (INDEPENDENT_AMBULATORY_CARE_PROVIDER_SITE_OTHER): Payer: Medicare Other | Admitting: *Deleted

## 2012-11-07 DIAGNOSIS — I4891 Unspecified atrial fibrillation: Secondary | ICD-10-CM

## 2012-11-07 DIAGNOSIS — Z7901 Long term (current) use of anticoagulants: Secondary | ICD-10-CM

## 2012-11-07 LAB — POCT INR: INR: 1.2

## 2012-11-14 ENCOUNTER — Ambulatory Visit (INDEPENDENT_AMBULATORY_CARE_PROVIDER_SITE_OTHER): Payer: Medicare Other | Admitting: Pharmacist

## 2012-11-14 DIAGNOSIS — Z7901 Long term (current) use of anticoagulants: Secondary | ICD-10-CM

## 2012-11-14 DIAGNOSIS — I4891 Unspecified atrial fibrillation: Secondary | ICD-10-CM

## 2012-11-14 LAB — POCT INR: INR: 2

## 2012-11-27 ENCOUNTER — Other Ambulatory Visit: Payer: Self-pay | Admitting: Physical Medicine and Rehabilitation

## 2012-11-27 ENCOUNTER — Ambulatory Visit (INDEPENDENT_AMBULATORY_CARE_PROVIDER_SITE_OTHER): Payer: Medicare Other | Admitting: *Deleted

## 2012-11-27 DIAGNOSIS — I4891 Unspecified atrial fibrillation: Secondary | ICD-10-CM

## 2012-11-27 DIAGNOSIS — Z7901 Long term (current) use of anticoagulants: Secondary | ICD-10-CM

## 2012-11-28 ENCOUNTER — Other Ambulatory Visit: Payer: Self-pay | Admitting: *Deleted

## 2012-11-28 ENCOUNTER — Other Ambulatory Visit: Payer: Self-pay

## 2012-11-28 MED ORDER — FUROSEMIDE 40 MG PO TABS: 40.0000 mg | ORAL_TABLET | Freq: Two times a day (BID) | ORAL | Status: DC

## 2012-11-28 MED ORDER — GABAPENTIN 400 MG PO CAPS: 400.0000 mg | ORAL_CAPSULE | Freq: Three times a day (TID) | ORAL | Status: DC

## 2012-12-18 ENCOUNTER — Ambulatory Visit (INDEPENDENT_AMBULATORY_CARE_PROVIDER_SITE_OTHER): Payer: Medicare Other | Admitting: *Deleted

## 2012-12-18 DIAGNOSIS — Z7901 Long term (current) use of anticoagulants: Secondary | ICD-10-CM

## 2012-12-18 DIAGNOSIS — I4891 Unspecified atrial fibrillation: Secondary | ICD-10-CM

## 2012-12-18 LAB — POCT INR: INR: 2

## 2012-12-23 ENCOUNTER — Telehealth: Payer: Self-pay | Admitting: Internal Medicine

## 2012-12-23 ENCOUNTER — Encounter (HOSPITAL_COMMUNITY): Payer: Self-pay | Admitting: *Deleted

## 2012-12-23 ENCOUNTER — Emergency Department (HOSPITAL_COMMUNITY): Payer: Medicare Other

## 2012-12-23 ENCOUNTER — Telehealth: Payer: Self-pay | Admitting: Cardiology

## 2012-12-23 ENCOUNTER — Inpatient Hospital Stay (HOSPITAL_COMMUNITY)
Admission: EM | Admit: 2012-12-23 | Discharge: 2012-12-25 | DRG: 287 | Disposition: A | Discharge status: Home / Self Care | Payer: Medicare Other | Attending: Cardiology | Admitting: Cardiology

## 2012-12-23 DIAGNOSIS — Z8679 Personal history of other diseases of the circulatory system: Secondary | ICD-10-CM

## 2012-12-23 DIAGNOSIS — I200 Unstable angina: Secondary | ICD-10-CM

## 2012-12-23 DIAGNOSIS — E46 Unspecified protein-calorie malnutrition: Secondary | ICD-10-CM

## 2012-12-23 DIAGNOSIS — J449 Chronic obstructive pulmonary disease, unspecified: Secondary | ICD-10-CM | POA: Diagnosis present

## 2012-12-23 DIAGNOSIS — D649 Anemia, unspecified: Secondary | ICD-10-CM

## 2012-12-23 DIAGNOSIS — I350 Nonrheumatic aortic (valve) stenosis: Secondary | ICD-10-CM

## 2012-12-23 DIAGNOSIS — N400 Enlarged prostate without lower urinary tract symptoms: Secondary | ICD-10-CM | POA: Diagnosis present

## 2012-12-23 DIAGNOSIS — K279 Peptic ulcer, site unspecified, unspecified as acute or chronic, without hemorrhage or perforation: Secondary | ICD-10-CM | POA: Diagnosis present

## 2012-12-23 DIAGNOSIS — M1990 Unspecified osteoarthritis, unspecified site: Secondary | ICD-10-CM | POA: Diagnosis present

## 2012-12-23 DIAGNOSIS — E669 Obesity, unspecified: Secondary | ICD-10-CM

## 2012-12-23 DIAGNOSIS — I272 Pulmonary hypertension, unspecified: Secondary | ICD-10-CM

## 2012-12-23 DIAGNOSIS — I359 Nonrheumatic aortic valve disorder, unspecified: Secondary | ICD-10-CM | POA: Diagnosis present

## 2012-12-23 DIAGNOSIS — I4891 Unspecified atrial fibrillation: Secondary | ICD-10-CM

## 2012-12-23 DIAGNOSIS — I872 Venous insufficiency (chronic) (peripheral): Secondary | ICD-10-CM | POA: Diagnosis present

## 2012-12-23 DIAGNOSIS — I2789 Other specified pulmonary heart diseases: Secondary | ICD-10-CM | POA: Diagnosis present

## 2012-12-23 DIAGNOSIS — I10 Essential (primary) hypertension: Secondary | ICD-10-CM

## 2012-12-23 DIAGNOSIS — I2510 Atherosclerotic heart disease of native coronary artery without angina pectoris: Secondary | ICD-10-CM

## 2012-12-23 DIAGNOSIS — M109 Gout, unspecified: Secondary | ICD-10-CM | POA: Diagnosis present

## 2012-12-23 DIAGNOSIS — Z5181 Encounter for therapeutic drug level monitoring: Secondary | ICD-10-CM

## 2012-12-23 DIAGNOSIS — I480 Paroxysmal atrial fibrillation: Secondary | ICD-10-CM

## 2012-12-23 DIAGNOSIS — G608 Other hereditary and idiopathic neuropathies: Secondary | ICD-10-CM | POA: Diagnosis present

## 2012-12-23 DIAGNOSIS — Z7901 Long term (current) use of anticoagulants: Secondary | ICD-10-CM

## 2012-12-23 DIAGNOSIS — Z6841 Body Mass Index (BMI) 40.0 and over, adult: Secondary | ICD-10-CM

## 2012-12-23 DIAGNOSIS — Z9889 Other specified postprocedural states: Secondary | ICD-10-CM

## 2012-12-23 DIAGNOSIS — G47419 Narcolepsy without cataplexy: Secondary | ICD-10-CM | POA: Diagnosis present

## 2012-12-23 DIAGNOSIS — S98139A Complete traumatic amputation of one unspecified lesser toe, initial encounter: Secondary | ICD-10-CM

## 2012-12-23 DIAGNOSIS — G4733 Obstructive sleep apnea (adult) (pediatric): Secondary | ICD-10-CM | POA: Diagnosis present

## 2012-12-23 DIAGNOSIS — J4489 Other specified chronic obstructive pulmonary disease: Secondary | ICD-10-CM | POA: Diagnosis present

## 2012-12-23 DIAGNOSIS — K219 Gastro-esophageal reflux disease without esophagitis: Secondary | ICD-10-CM | POA: Diagnosis present

## 2012-12-23 DIAGNOSIS — E7800 Pure hypercholesterolemia, unspecified: Secondary | ICD-10-CM

## 2012-12-23 DIAGNOSIS — H409 Unspecified glaucoma: Secondary | ICD-10-CM | POA: Diagnosis present

## 2012-12-23 HISTORY — DX: Paroxysmal atrial fibrillation: I48.0

## 2012-12-23 LAB — BASIC METABOLIC PANEL
BUN: 15 mg/dL (ref 6–23)
Chloride: 104 mEq/L (ref 96–112)
Creatinine, Ser: 0.71 mg/dL (ref 0.50–1.35)
GFR calc Af Amer: >90 mL/min (ref >90)
GFR calc non Af Amer: >90 mL/min (ref >90)

## 2012-12-23 LAB — CBC
MCH: 30.2 pg (ref 26.0–34.0)
MCHC: 34.4 g/dL (ref 30.0–36.0)
MCV: 88 fL (ref 78.0–100.0)
MCV: 88.1 fL (ref 78.0–100.0)
Platelets: 164 10*3/uL (ref 150–400)
Platelets: 158 10*3/uL (ref 150–400)
RBC: 3.88 MIL/uL — ABNORMAL LOW (ref 4.22–5.81)
RDW: 13.8 % (ref 11.5–15.5)
RDW: 14 % (ref 11.5–15.5)
WBC: 6.7 10*3/uL (ref 4.0–10.5)

## 2012-12-23 LAB — APTT: aPTT: 33 seconds (ref 24–37)

## 2012-12-23 LAB — PROTIME-INR: Prothrombin Time: 19.6 seconds — ABNORMAL HIGH (ref 11.6–15.2)

## 2012-12-23 LAB — POCT I-STAT TROPONIN I

## 2012-12-23 LAB — CREATININE, SERUM: Creatinine, Ser: 0.73 mg/dL (ref 0.50–1.35)

## 2012-12-23 MED ORDER — ALBUTEROL SULFATE HFA 108 (90 BASE) MCG/ACT IN AERS: 2.0000 | INHALATION_SPRAY | Freq: Four times a day (QID) | RESPIRATORY_TRACT | Status: DC | PRN

## 2012-12-23 MED ORDER — EZETIMIBE 10 MG PO TABS
10.0000 mg | ORAL_TABLET | Freq: Every day | ORAL | Status: DC
  Administered 2012-12-24 – 2012-12-25 (×2): 10 mg via ORAL
  Filled 2012-12-23 (×2): qty 1

## 2012-12-23 MED ORDER — OXYCODONE-ACETAMINOPHEN 10-325 MG PO TABS: 1.0000 | ORAL_TABLET | ORAL | Status: DC | PRN

## 2012-12-23 MED ORDER — ENOXAPARIN SODIUM 40 MG/0.4ML ~~LOC~~ SOLN
40.0000 mg | SUBCUTANEOUS | Status: DC
  Administered 2012-12-23: 40 mg via SUBCUTANEOUS
  Filled 2012-12-23 (×2): qty 0.4

## 2012-12-23 MED ORDER — SODIUM CHLORIDE 0.9 % IJ SOLN
3.0000 mL | Freq: Two times a day (BID) | INTRAMUSCULAR | Status: DC
  Administered 2012-12-24 – 2012-12-25 (×2): 3 mL via INTRAVENOUS

## 2012-12-23 MED ORDER — SODIUM CHLORIDE 0.9 % IV SOLN: 250.0000 mL | INTRAVENOUS | Status: DC | PRN

## 2012-12-23 MED ORDER — SODIUM CHLORIDE 0.9 % IJ SOLN: 3.0000 mL | INTRAMUSCULAR | Status: DC | PRN

## 2012-12-23 MED ORDER — ASPIRIN 81 MG PO CHEW
324.0000 mg | CHEWABLE_TABLET | Freq: Once | ORAL | Status: AC
Start: 2012-12-23 — End: 2012-12-23
  Administered 2012-12-23: 324 mg via ORAL
  Filled 2012-12-23: qty 4

## 2012-12-23 MED ORDER — HYDROCODONE-ACETAMINOPHEN 5-325 MG PO TABS
1.0000 | ORAL_TABLET | Freq: Every day | ORAL | Status: DC
  Administered 2012-12-23 – 2012-12-24 (×2): 1 via ORAL
  Filled 2012-12-23 (×2): qty 1

## 2012-12-23 MED ORDER — LOSARTAN POTASSIUM 50 MG PO TABS
50.0000 mg | ORAL_TABLET | Freq: Two times a day (BID) | ORAL | Status: DC
  Filled 2012-12-23: qty 1

## 2012-12-23 MED ORDER — MOMETASONE FURO-FORMOTEROL FUM 100-5 MCG/ACT IN AERO
2.0000 | INHALATION_SPRAY | Freq: Two times a day (BID) | RESPIRATORY_TRACT | Status: DC
  Administered 2012-12-24 – 2012-12-25 (×3): 2 via RESPIRATORY_TRACT
  Filled 2012-12-23: qty 8.8

## 2012-12-23 MED ORDER — FUROSEMIDE 40 MG PO TABS
40.0000 mg | ORAL_TABLET | Freq: Two times a day (BID) | ORAL | Status: DC
  Administered 2012-12-23: 40 mg via ORAL
  Filled 2012-12-23 (×3): qty 1

## 2012-12-23 MED ORDER — SODIUM CHLORIDE 0.9 % IJ SOLN
3.0000 mL | Freq: Two times a day (BID) | INTRAMUSCULAR | Status: DC
  Administered 2012-12-23 – 2012-12-24 (×2): 3 mL via INTRAVENOUS

## 2012-12-23 MED ORDER — OXYCODONE HCL 5 MG PO TABS
5.0000 mg | ORAL_TABLET | ORAL | Status: DC | PRN
  Administered 2012-12-23 – 2012-12-25 (×5): 5 mg via ORAL
  Filled 2012-12-23 (×4): qty 1

## 2012-12-23 MED ORDER — ALLOPURINOL 300 MG PO TABS
300.0000 mg | ORAL_TABLET | Freq: Every day | ORAL | Status: DC
  Administered 2012-12-24 – 2012-12-25 (×2): 300 mg via ORAL
  Filled 2012-12-23 (×2): qty 1

## 2012-12-23 MED ORDER — OMEGA-3-ACID ETHYL ESTERS 1 G PO CAPS
1.0000 g | ORAL_CAPSULE | Freq: Two times a day (BID) | ORAL | Status: DC
  Administered 2012-12-23 – 2012-12-25 (×4): 1 g via ORAL
  Filled 2012-12-23 (×6): qty 1

## 2012-12-23 MED ORDER — OXYCODONE-ACETAMINOPHEN 5-325 MG PO TABS
1.0000 | ORAL_TABLET | ORAL | Status: DC | PRN
  Administered 2012-12-23 – 2012-12-25 (×5): 1 via ORAL
  Filled 2012-12-23 (×5): qty 1

## 2012-12-23 MED ORDER — ADULT MULTIVITAMIN W/MINERALS CH
1.0000 | ORAL_TABLET | Freq: Every day | ORAL | Status: DC
  Administered 2012-12-24 – 2012-12-25 (×2): 1 via ORAL
  Filled 2012-12-23 (×2): qty 1

## 2012-12-23 MED ORDER — LOSARTAN POTASSIUM 50 MG PO TABS
100.0000 mg | ORAL_TABLET | Freq: Two times a day (BID) | ORAL | Status: DC
  Administered 2012-12-23: 100 mg via ORAL
  Filled 2012-12-23 (×3): qty 2

## 2012-12-23 MED ORDER — POTASSIUM CHLORIDE CRYS ER 20 MEQ PO TBCR
40.0000 meq | EXTENDED_RELEASE_TABLET | Freq: Once | ORAL | Status: AC
  Administered 2012-12-23: 40 meq via ORAL
  Filled 2012-12-23: qty 2

## 2012-12-23 MED ORDER — NITROGLYCERIN 0.4 MG SL SUBL: 0.4000 mg | SUBLINGUAL_TABLET | SUBLINGUAL | Status: DC | PRN

## 2012-12-23 MED ORDER — ALPRAZOLAM 0.25 MG PO TABS: 0.2500 mg | ORAL_TABLET | Freq: Two times a day (BID) | ORAL | Status: DC | PRN

## 2012-12-23 MED ORDER — VITAMIN E 45 MG (100 UNIT) PO CAPS
200.0000 [IU] | ORAL_CAPSULE | Freq: Every day | ORAL | Status: DC
  Administered 2012-12-23 – 2012-12-24 (×2): 200 [IU] via ORAL
  Filled 2012-12-23 (×3): qty 2

## 2012-12-23 MED ORDER — NIACIN ER (ANTIHYPERLIPIDEMIC) 500 MG PO TBCR
1000.0000 mg | EXTENDED_RELEASE_TABLET | Freq: Two times a day (BID) | ORAL | Status: DC
  Administered 2012-12-23 – 2012-12-25 (×4): 1000 mg via ORAL
  Filled 2012-12-23 (×5): qty 2

## 2012-12-23 MED ORDER — ONDANSETRON HCL 4 MG/2ML IJ SOLN: 4.0000 mg | Freq: Four times a day (QID) | INTRAMUSCULAR | Status: DC | PRN

## 2012-12-23 MED ORDER — PHYTONADIONE 5 MG PO TABS
2.5000 mg | ORAL_TABLET | Freq: Once | ORAL | Status: AC
  Administered 2012-12-23: 2.5 mg via ORAL
  Filled 2012-12-23: qty 1

## 2012-12-23 MED ORDER — POTASSIUM CHLORIDE CRYS ER 20 MEQ PO TBCR
20.0000 meq | EXTENDED_RELEASE_TABLET | Freq: Every day | ORAL | Status: DC
  Administered 2012-12-24 – 2012-12-25 (×2): 20 meq via ORAL
  Filled 2012-12-23 (×4): qty 1

## 2012-12-23 MED ORDER — ATORVASTATIN CALCIUM 80 MG PO TABS
80.0000 mg | ORAL_TABLET | Freq: Every day | ORAL | Status: DC
  Administered 2012-12-24: 80 mg via ORAL
  Filled 2012-12-23 (×2): qty 1

## 2012-12-23 MED ORDER — DIAZEPAM 5 MG PO TABS
5.0000 mg | ORAL_TABLET | ORAL | Status: AC
  Administered 2012-12-24: 5 mg via ORAL
  Filled 2012-12-23: qty 1

## 2012-12-23 MED ORDER — METOPROLOL SUCCINATE ER 100 MG PO TB24
200.0000 mg | ORAL_TABLET | Freq: Two times a day (BID) | ORAL | Status: DC
  Administered 2012-12-23 – 2012-12-25 (×4): 200 mg via ORAL
  Filled 2012-12-23 (×5): qty 2

## 2012-12-23 MED ORDER — PANTOPRAZOLE SODIUM 40 MG PO TBEC
40.0000 mg | DELAYED_RELEASE_TABLET | Freq: Every day | ORAL | Status: DC
  Administered 2012-12-23 – 2012-12-25 (×3): 40 mg via ORAL
  Filled 2012-12-23 (×3): qty 1

## 2012-12-23 MED ORDER — B COMPLEX PO TABS: 1.0000 | ORAL_TABLET | Freq: Every day | ORAL | Status: DC

## 2012-12-23 MED ORDER — FINASTERIDE 5 MG PO TABS
5.0000 mg | ORAL_TABLET | Freq: Every day | ORAL | Status: DC
  Administered 2012-12-24 – 2012-12-25 (×2): 5 mg via ORAL
  Filled 2012-12-23 (×2): qty 1

## 2012-12-23 MED ORDER — ACETAMINOPHEN 325 MG PO TABS: 650.0000 mg | ORAL_TABLET | ORAL | Status: DC | PRN

## 2012-12-23 MED ORDER — ZOLPIDEM TARTRATE 5 MG PO TABS: 5.0000 mg | ORAL_TABLET | Freq: Every evening | ORAL | Status: DC | PRN

## 2012-12-23 MED ORDER — SODIUM CHLORIDE 0.9 % IV SOLN: INTRAVENOUS | Status: DC

## 2012-12-23 MED ORDER — ASPIRIN 81 MG PO CHEW
324.0000 mg | CHEWABLE_TABLET | ORAL | Status: AC
  Administered 2012-12-24: 324 mg via ORAL
  Filled 2012-12-23: qty 4

## 2012-12-23 MED ORDER — B COMPLEX-C PO TABS
1.0000 | ORAL_TABLET | Freq: Every day | ORAL | Status: DC
  Administered 2012-12-23 – 2012-12-24 (×2): 1 via ORAL
  Filled 2012-12-23 (×3): qty 1

## 2012-12-23 MED ORDER — VITAMIN C 500 MG PO TABS: 500.0000 mg | ORAL_TABLET | Freq: Every day | ORAL | Status: DC

## 2012-12-23 NOTE — ED Notes (Signed)
MD Eulis Foster at bedside.

## 2012-12-23 NOTE — H&P (Signed)
History and Physical   Patient ID: Tim [name] MRN: [id number], DOB/AGE: Oct 04, 1940 72 y.o. Date of Encounter: 12/23/2012  Primary Physician: Adella Hare, MD Primary Cardiologist: [facility]  Chief Complaint:  Chest pain  HPI: Tim [name] is a 72 y.o. male with a history of non-obstructive CAD by prior cath and PAF. He began having SSCP, described as a pressure with exertion, no radiation about 5 days ago. The symptoms resolve in about 5 minutes as long as he stops the exertion. There are no associated symptoms. He has not had resting symptoms. Today, he was at a rehearsal and had to walk up a hill. He had the same pain he has been having but it was a little worse, about a 4/10. The symptoms again resolved with stopping the exertion. In the ER, he is resting comfortably.  Past Medical History  Diagnosis Date  . HYPERCHOLESTEROLEMIA-PURE   . GOUT   . OBSTRUCTIVE SLEEP APNEA   . Essential hypertension, benign   . VENOUS INSUFFICIENCY   . ASTHMA   . COPD   . VENTRAL HERNIA   . OSTEOARTHRITIS   . CATARACT, HX OF   . PULMONARY HYPERTENSION, HX OF   . Hallux valgus (acquired)   . Obesity, unspecified   . Osteoarthrosis, unspecified whether generalized or localized, unspecified site   . Unspecified glaucoma(365.9)   . Heart murmur   . Atrial fibrillation 2003  . Lumbosacral neuritis   . Unspecified hereditary and idiopathic peripheral neuropathy   . Lumbosacral spondylosis   . CAD (coronary artery disease)   . PUD (peptic ulcer disease)   . BPH (benign prostatic hypertrophy)   . HH (hiatus hernia)   . Glaucoma   . Narcolepsy   . GERD (gastroesophageal reflux disease)   . Personal history of colonic adenoma 10/30/2012    Surgical History:  Past Surgical History  Procedure Laterality Date  . Hemorrhoid surgery    . Vitrectomy    . Intraocular lens insertion Bilateral   . Leg surgery Bilateral   . Knee surgery Right   . Traeculectomy Left     eye  . Shoulder  arthroscopy  08/30/2011    Procedure: ARTHROSCOPY SHOULDER;  Surgeon: [name], MD;  Location: [place];  Service: Orthopedics;  Laterality: Right;  Right Shoulder Arthroscopy, Debridement, and Decompression  . Tonsillectomy    . Colonoscopy    . Cholecystectomy    . Toe amputation    . Colonoscopy [name] 10/30/2012    Procedure: COLONOSCOPY;  Surgeon: [name], MD;  Location: [facility];  Service: Endoscopy;  Laterality: [name];  . Cardiac catheterization  1997    Non-obstructive disease    I have reviewed the patient's current medications. Prior to Admission medications   Medication Sig  albuterol (PROVENTIL HFA;VENTOLIN HFA) 108 (90 BASE) MCG/ACT inhaler Inhale 2 puffs into the lungs 4 (four) times daily as needed for wheezing or shortness of breath.  allopurinol (ZYLOPRIM) 300 MG tablet Take 300 mg by mouth daily.  Ascorbic Acid (VITAMIN C PO) Take 1 tablet by mouth at bedtime.  b complex vitamins tablet Take 1 tablet by mouth at bedtime.  B Complex-C (B-COMPLEX WITH VITAMIN C) tablet Take 1 tablet by mouth at bedtime.   ezetimibe (ZETIA) 10 MG tablet Take 10 mg by mouth daily.  finasteride (PROSCAR) 5 MG tablet Take 1 tablet (5 mg total) by mouth daily.  Fluticasone-Salmeterol (ADVAIR) 250-50 MCG/DOSE AEPB Inhale 1 puff into the lungs every 12 (twelve) hours.  furosemide (LASIX) 40 MG tablet Take 1 tablet (40 mg total) by mouth 2 (two) times daily.  HYDROcodone-acetaminophen (NORCO) 7.5-325 MG per tablet Take 1 tablet by mouth at bedtime.  losartan (COZAAR) 50 MG tablet Take 1 tablet (50 mg total) by mouth 2 (two) times daily.  metoprolol (TOPROL-XL) 200 MG 24 hr tablet Take 200 mg by mouth 2 (two) times daily.  Multiple Vitamin (MULTIVITAMIN WITH MINERALS) TABS Take 1 tablet by mouth daily.  niacin (NIASPAN) 1000 MG CR tablet Take 1,000 mg by mouth 2 (two) times daily.  Omega-3 Fatty Acids (FISH OIL) 1000 MG CAPS Take 2,000 mg by mouth 2 (two) times daily.  omeprazole (PRILOSEC)  10 MG capsule Take 1 capsule (10 mg total) by mouth daily.  oxyCODONE-acetaminophen (PERCOCET) 10-325 MG per tablet Take 1 tablet by mouth every 4 (four) hours as needed for pain.  rosuvastatin (CRESTOR) 40 MG tablet Take 40 mg by mouth at bedtime.  vitamin E 200 UNIT capsule Take 200 Units by mouth at bedtime.   warfarin (COUMADIN) 5 MG tablet Take 2.5 mg by mouth at bedtime.   Allergies:  Allergies  Allergen Reactions  . Brimonidine Tartrate     Shortness of breath  . Brinzolamide     Shortness of breath  . Celecoxib     confusion  . Codeine     Childhood reaction  . Colchicine     diarrhea  . Diltiazem      leg swelling  . Latanoprost     Shortness of breath  . Rofecoxib      leg swelling  . Timolol Maleate     Aggravated asthma  . Vancomycin     hives/blisters   History   Social History  . Marital Status: Married    Spouse Name: [name]    Number of Children: 0  . Years of Education: 20   Occupational History  . engineering     retired   Social History Main Topics  . Smoking status: Never Smoker   . Smokeless tobacco: Never Used  . Alcohol Use: No  . Drug Use: Yes    Special: Oxycodone  . Sexually Active: Not Currently   Other Topics Concern  . Not on file   Social History Narrative   HSG, [name] - BS, [name] - MS-engineering, 2 years on PhD - [place]. Married - '65 - 91yr/divorced; '76- 3 yrs/divorced; '92 . No children. 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 ACP/Living Will - Yes CPR; long-term Mechanical ventilation as long as he was able to cognate; ok for long term artificial nutrition. Precondition being able to cognate and not to have too much pain.     Family History  Problem Relation Age of Onset  . Hypertension Mother   . Coronary artery disease Mother   . Heart attack Mother   . Pulmonary fibrosis Father     asbestosis   Family Status  Relation Status Death Age  . Mother Deceased 776 . Father Deceased  873  Review of Systems: He sleeps in a recliner because of back problems. He has COPD and chronic DOE but this has not changed recently. He has chronic LE edema, a little better recently, possibly secondary to the change in sleeping situation. No recent fevers or chills. Murmur has been there for years. Full 14-point review of systems otherwise negative except as noted above.  Physical Exam: Blood pressure 131/86, pulse 92, temperature 98.3 F (36.8 C),  temperature source Oral, resp. rate 26, SpO2 99.00%. General: Well developed, well nourished,male in no acute distress. Head: Normocephalic, atraumatic, sclera non-icteric, no xanthomas, nares are without discharge. Dentition: good Neck: Bilateral carotid bruits, much more prominent on the left. JVD not definitely elevated but difficult to assess secondary to body habitus. No thyromegally Lungs: Good expansion bilaterally, without wheezes or rhonchi.  Heart: Regular rate and rhythm with S1; decreased intensity of A2.  No S3 or S4. 2-3/6 wheezing, mid-peaking, systolic murmur, best heard at the cardiac base; no rubs, or gallops appreciated. Abdomen: Obese, Soft, non-tender, non-distended with normoactive bowel sounds. No hepatomegaly. No rebound/guarding. No obvious abdominal masses. Msk:  Appears to have generalized weakness for age. S/P left great toe amputation; no spine or costo-vertebral angle tenderness. Extremities: No clubbing or cyanosis. 2+ bilateral edema R>L.  Distal pedal pulses are 2+ in 4 extrem; chronic stasis changes Neuro: Alert and oriented X 3. Moves all extremities spontaneously. No focal deficits noted. Psych:  Responds to questions appropriately with a normal affect. Skin: No rashes or lesions noted; chronic LE bilateral changes.  Labs:   Lab Results  Component Value Date   WBC 6.7 12/23/2012   HGB 12.4* 12/23/2012   HCT 36.0* 12/23/2012   MCV 88.0 12/23/2012   PLT 164 12/23/2012    Recent Labs  12/23/12 1801  INR  1.72*     Recent Labs Lab 12/23/12 1801  NA 143  K 3.4*  CL 104  CO2 29  BUN 15  CREATININE 0.71  CALCIUM 9.4  GLUCOSE 121*   Recent Labs  12/23/12 1817  TROPIPOC 0.01   Lab Results  Component Value Date   CHOL 108 06/05/2012   HDL 52 06/05/2012   LDLCALC 37 06/05/2012   TRIG 94.0 06/05/2012   Radiology/Studies: Dg Chest 12/23/2012   1.  No acute cardiopulmonary process demonstrated. 2.  Cannot exclude developing left lung nodule, although this could be secondary to an overlap of vascular and osseous structures.   Echo: 11/10/2010  mild LVH; nl EF; mild to moderate aortic stenosis; mild to moderate pulmonary hypertension  ECG: 23-Dec-2012 17:34:21 Sinus tachycardia; RBBB; consider prior IMI   ASSESSMENT AND PLAN:  Principal Problem:   Exertional angina - admit, cycle enzymes, cath am. Active Problems:   OBESITY - encourage heart health eating   OBSTRUCTIVE SLEEP APNEA - continue CPAP   Essential hypertension, benign - continue home meds.    Atrial fibrillation - currently SR, follow on telemetry.   VENOUS INSUFFICIENCY  - continue current therapies.   HEART MURMUR, HX OF - no echo in 2 years, no symptoms definitely attributable to valvular disease,    Long term current use of anticoagulant - Hold coumadin and start heparin for INR < 2.0. Recheck in am, hopefully will be low enough for cath in am.   Rosaria Ferries, PA-C 12/23/2012 8:21 PM Beeper [phone]  Cardiology Attending Patient interviewed and examined. Discussed with Rosaria Ferries, PA-C.  Above note annotated and modified based upon my findings.  Blood pressure control has been adequate in recent months, but systolics elevated since arrival in the emergency department. He may need adjustment of his antihypertensive medication. Since symptoms have only occurred with exertion, we will not treat for unstable angina. Symptoms are fairly classic, but he has had 2 catheterizations in the past, albeit with the most  recent occurring 7 years ago, apparently without significant obstructive disease. Risks and benefits of stress testing and cardiac catheterization discussed with patient and his  wife. They agree to proceed with cath, which we will attempt to take tomorrow. Low-dose oral vitamin K given-in the absence of active atrial fibrillation, risk of temporarily discontinuing anticoagulants is minimal.  Jacqulyn Ducking, MD 12/23/2012, 9:14 PM

## 2012-12-23 NOTE — Telephone Encounter (Signed)
New Prob    Pt wanted to let Dr. Stanford Breed know he is about to go to the ER for possible angina.

## 2012-12-23 NOTE — Telephone Encounter (Signed)
Left message for pt to call.

## 2012-12-23 NOTE — Plan of Care (Signed)
Problem: Phase I Progression Outcomes Goal: Other Phase I Outcomes/Goals Outcome: Completed/Met Date Met:  12/23/12 Cardiac catheterization education/pathway initiated

## 2012-12-23 NOTE — Progress Notes (Signed)
RT placed patient on cpap pressure of 8cmH20. Patient is tolerating cpap well at this time.

## 2012-12-23 NOTE — ED Provider Notes (Signed)
History    CSN: [id number] Arrival date & time 12/23/12  1729  First MD Initiated Contact with Tim [name] 12/23/12 1810     Chief Complaint  Tim [name] presents with  . Chest Pain   (Consider location/radiation/quality/duration/timing/severity/associated sxs/prior Treatment) HPI Comments: Tim [name] is a 72 y.o. Male who complains of midsternal chest pain, onset 3 days ago, only with exertion and relieved with rest. The discomfort is "pressure-like". He's never had this previously. He has chronic, ongoing, sweating, and shortness of breath with exertion; both of which are marginally worse with ambulation in the last several days. He denies other symptoms. There are no other modifying factors.  Tim [name] is a 72 y.o. male presenting with chest pain. The history is provided by the Tim [name].  Chest Pain  Past Medical History  Diagnosis Date  . HYPERCHOLESTEROLEMIA-PURE   . GOUT   . OBSTRUCTIVE SLEEP APNEA   . Essential hypertension, benign   . VENOUS INSUFFICIENCY   . ASTHMA   . COPD   . VENTRAL HERNIA   . OSTEOARTHRITIS   . CATARACT, HX OF   . PULMONARY HYPERTENSION, HX OF   . Hallux valgus (acquired)   . Obesity, unspecified   . Osteoarthrosis, unspecified whether generalized or localized, unspecified site   . Unspecified glaucoma(365.9)   . Heart murmur   . Atrial fibrillation 2003  . Lumbosacral neuritis   . Unspecified hereditary and idiopathic peripheral neuropathy   . Lumbosacral spondylosis   . CAD (coronary artery disease)   . PUD (peptic ulcer disease)   . BPH (benign prostatic hypertrophy)   . HH (hiatus hernia)   . Glaucoma   . Narcolepsy   . GERD (gastroesophageal reflux disease)   . Personal history of colonic adenoma 10/30/2012   Past Surgical History  Procedure Laterality Date  . Hemorrhoid surgery    . Vitrectomy    . Intraocular lens insertion Bilateral   . Leg surgery Bilateral   . Knee surgery Right   . Traeculectomy Left     eye  . Shoulder  arthroscopy  08/30/2011    Procedure: ARTHROSCOPY SHOULDER;  Surgeon: [name], MD;  Location: [place];  Service: Orthopedics;  Laterality: Right;  Right Shoulder Arthroscopy, Debridement, and Decompression  . Tonsillectomy    . Colonoscopy    . Cholecystectomy    . Toe amputation    . Colonoscopy [name] 10/30/2012    Procedure: COLONOSCOPY;  Surgeon: [name], MD;  Location: [facility];  Service: Endoscopy;  Laterality: [name];  . Cardiac catheterization  1997    Non-obstructive disease   Family History  Problem Relation Age of Onset  . Hypertension Mother   . Coronary artery disease Mother   . Heart attack Mother   . Pulmonary fibrosis Father     asbestosis   History  Substance Use Topics  . Smoking status: Never Smoker   . Smokeless tobacco: Never Used  . Alcohol Use: No    Review of Systems  Cardiovascular: Positive for chest pain.  All other systems reviewed and are negative.    Allergies  Brimonidine tartrate; Brinzolamide; Celecoxib; Codeine; Colchicine; Diltiazem; Latanoprost; Rofecoxib; Timolol maleate; and Vancomycin  Home Medications   No current outpatient prescriptions on file. BP 149/77  Pulse 91  Temp(Src) 98.3 F (36.8 C) (Oral)  Resp 18  Ht 6' 2"  (1.88 m)  Wt 360 lb 1.6 oz (163.34 kg)  BMI 46.21 kg/m2  SpO2 99% Physical Exam  Nursing note and vitals reviewed.  Constitutional: He is oriented to person, place, and time. He appears well-developed and well-nourished.  Obese elderly, male  HENT:  Head: Normocephalic and atraumatic.  Right Ear: External ear normal.  Left Ear: External ear normal.  Eyes: Conjunctivae and EOM are normal. Pupils are equal, round, and reactive to light.  Neck: Normal range of motion and phonation normal. Neck supple.  Cardiovascular: Normal rate, regular rhythm, normal heart sounds and intact distal pulses.   Pulmonary/Chest: Effort normal and breath sounds normal. He has no wheezes. He has no rales. He exhibits no  tenderness and no bony tenderness.  Abdominal: Soft. Normal appearance. There is no tenderness.  Musculoskeletal: Normal range of motion. He exhibits edema (Bilateral 3+).  Mild erythema both anterior legs; no areas of fluctuance or drainage.  Neurological: He is alert and oriented to person, place, and time. He has normal strength. No cranial nerve deficit or sensory deficit. He exhibits normal muscle tone. Coordination normal.  Skin: Skin is warm, dry and intact.  Psychiatric: He has a normal mood and affect. His behavior is normal. Judgment and thought content normal.    ED Course  Procedures (including critical care time)  Medications  nitroGLYCERIN (NITROSTAT) SL tablet 0.4 mg (not administered)  acetaminophen (TYLENOL) tablet 650 mg (not administered)  ondansetron (ZOFRAN) injection 4 mg (not administered)  ALPRAZolam (XANAX) tablet 0.25 mg (not administered)  sodium chloride 0.9 % injection 3 mL (0 mLs Intravenous Duplicate 4/31/54 0086)  sodium chloride 0.9 % injection 3 mL (not administered)  0.9 %  sodium chloride infusion (not administered)  enoxaparin (LOVENOX) injection 40 mg (40 mg Subcutaneous Given 12/23/12 2209)  zolpidem (AMBIEN) tablet 5 mg (not administered)  vitamin E capsule 200 Units (200 Units Oral Given 12/23/12 2210)  B-complex with vitamin C tablet 1 tablet (1 tablet Oral Given 12/23/12 2231)  pantoprazole (PROTONIX) EC tablet 40 mg (40 mg Oral Given 12/23/12 2210)  finasteride (PROSCAR) tablet 5 mg (not administered)  furosemide (LASIX) tablet 40 mg (40 mg Oral Given 12/23/12 2210)  mometasone-formoterol (DULERA) 100-5 MCG/ACT inhaler 2 puff (2 puffs Inhalation Not Given 12/23/12 2200)  albuterol (PROVENTIL HFA;VENTOLIN HFA) 108 (90 BASE) MCG/ACT inhaler 2 puff (not administered)  allopurinol (ZYLOPRIM) tablet 300 mg (not administered)  HYDROcodone-acetaminophen (NORCO/VICODIN) 5-325 MG per tablet 1 tablet (1 tablet Oral Given 12/23/12 2210)  metoprolol succinate  (TOPROL-XL) 24 hr tablet 200 mg (200 mg Oral Given 12/23/12 2210)  niacin (NIASPAN) CR tablet 1,000 mg (1,000 mg Oral Given 12/23/12 2231)  ezetimibe (ZETIA) tablet 10 mg (not administered)  multivitamin with minerals tablet 1 tablet (1 tablet Oral Not Given 12/23/12 2216)  omega-3 acid ethyl esters (LOVAZA) capsule 1 g (1 g Oral Given 12/23/12 2258)  atorvastatin (LIPITOR) tablet 80 mg (not administered)  sodium chloride 0.9 % injection 3 mL (3 mLs Intravenous Given 12/23/12 2259)  sodium chloride 0.9 % injection 3 mL (not administered)  0.9 %  sodium chloride infusion (not administered)  aspirin chewable tablet 324 mg (not administered)  diazepam (VALIUM) tablet 5 mg (not administered)  0.9 %  sodium chloride infusion (not administered)  oxyCODONE-acetaminophen (PERCOCET/ROXICET) 5-325 MG per tablet 1 tablet (1 tablet Oral Given 12/23/12 2302)    And  oxyCODONE (Oxy IR/ROXICODONE) immediate release tablet 5 mg (5 mg Oral Given 12/23/12 2302)  losartan (COZAAR) tablet 100 mg (100 mg Oral Given 12/23/12 2211)  potassium chloride SA (K-DUR,KLOR-CON) CR tablet 20 mEq (not administered)  aspirin chewable tablet 324 mg (324 mg Oral Given 12/23/12  1933)  phytonadione (VITAMIN K) tablet 2.5 mg (2.5 mg Oral Given 12/23/12 2210)  potassium chloride SA (K-DUR,KLOR-CON) CR tablet 40 mEq (40 mEq Oral Given 12/23/12 2210)    Tim [name] Vitals for the past 24 hrs:  BP Temp Temp src Pulse Resp SpO2 Height Weight  12/23/12 2153 149/77 mmHg 98.3 F (36.8 C) Oral 91 18 99 % 6' 2"  (1.88 m) 360 lb 1.6 oz (163.34 kg)  12/23/12 2100 160/71 mmHg - - 92 - 100 % - -  12/23/12 2032 162/71 mmHg 97.7 F (36.5 C) Axillary 91 14 98 % - -  12/23/12 2030 162/71 mmHg - - 90 - 98 % - -  12/23/12 1815 131/86 mmHg - - 92 26 99 % - -  12/23/12 1759 159/79 mmHg 98.3 F (36.8 C) Oral 92 17 99 % - -  12/23/12 1738 - 97.9 F (36.6 C) Oral - - - - -  12/23/12 1735 140/119 mmHg - - 102 20 98 % - -      Date: 12/23/12  Rate: 102   Rhythm: sinus tachycardia  QRS Axis: normal  PR and QT Intervals: normal  ST/T Wave abnormalities: normal  PR and QRS Conduction Disutrbances:right bundle branch block  Narrative Interpretation:   Old EKG Reviewed: unchanged    7:11 PM-Consult complete with Dr. Lattie Haw. Tim [name] case explained and discussed. He agrees to admit Tim [name] for further evaluation and treatment. Call ended at [place] Performed by: Daleen Bo L Total critical care time: 35 min. Critical care time was exclusive of separately billable procedures and treating other patients. Critical care was necessary to treat or prevent imminent or life-threatening deterioration. Critical care was time spent personally by me on the following activities: development of treatment plan with Tim [name] and/or surrogate as well as nursing, discussions with consultants, evaluation of Tim [name]'s response to treatment, examination of Tim [name], obtaining history from Tim [name] or surrogate, ordering and performing treatments and interventions, ordering and review of laboratory studies, ordering and review of radiographic studies, pulse oximetry and re-evaluation of Tim [name]'s condition.    Labs Reviewed  CBC - Abnormal; Notable for the following:    RBC 4.09 (*)    Hemoglobin 12.4 (*)    HCT 36.0 (*)    All other components within normal limits  BASIC METABOLIC PANEL - Abnormal; Notable for the following:    Potassium 3.4 (*)    Glucose, Bld 121 (*)    All other components within normal limits  PROTIME-INR - Abnormal; Notable for the following:    Prothrombin Time 19.6 (*)    INR 1.72 (*)    All other components within normal limits  CBC - Abnormal; Notable for the following:    RBC 3.88 (*)    Hemoglobin 11.7 (*)    HCT 34.2 (*)    All other components within normal limits  CREATININE, SERUM - Abnormal; Notable for the following:    GFR calc non Af Amer 90 (*)    All other components within normal limits  TROPONIN I   APTT  PROTIME-INR  TROPONIN I  TROPONIN I  COMPREHENSIVE METABOLIC PANEL  HEMOGLOBIN A1C  PROTIME-INR  POCT I-STAT TROPONIN I   Dg Chest 2 View  12/23/2012   *RADIOLOGY REPORT*  Clinical Data: Chest pain.  History of hypertension, atrial fibrillation and COPD.  CHEST - 2 VIEW  Comparison: 08/25/2011 and 04/03/2011.  Findings: The heart size and mediastinal contours are stable.  The lungs are mildly hyperinflated.  A small right  upper lobe granuloma appears unchanged.  There is a questionable new nodular density on the left at the confluence of the third and seventh ribs on the frontal examination.  This could be due to an overlap of vascular and osseous structures and is not visualized in the lateral projection.  There is no confluent airspace opacity or pleural effusion.  Mild degenerative changes in the spine are stable.  IMPRESSION:  1.  No acute cardiopulmonary process demonstrated. 2.  Cannot exclude developing left lung nodule, although this could be secondary to an overlap of vascular and osseous structures. This could be addressed with short-term radiographic follow-up. However, if clinically warranted based on risk factors for lung cancer, chest CT should be considered.   Original Report Authenticated By: Richardean Sale, M.D.   1. Unstable angina   2. Atrial fibrillation   3. Essential hypertension, benign     MDM  Evaluation is consistent with ACS.he is chest pain-free, in the ED, and the troponin is negative. His INR is elevated at 1.7. He was treated with aspirin in the ED. Consultation with cardiology to arrange admission    Nursing Notes Reviewed/ Care Coordinated, and agree without changes. Applicable Imaging Reviewed.  Interpretation of Laboratory Data incorporated into ED treatment   Plan: Admit   Richarda Blade, MD 12/24/12 [phone]

## 2012-12-23 NOTE — Telephone Encounter (Signed)
Patient called to inform Dr. Linda Hedges that he is going to ER.  Possible angina x 3-4 days per [facility] nurse.  [facility] nurse advised him to go to ER.

## 2012-12-23 NOTE — ED Notes (Signed)
Pt states on Thursday started having chest pain with ambulation intermittently.  Pt has never had chest pain with ambulation.  No increase in sob and has history of LE swelling

## 2012-12-23 NOTE — ED Notes (Signed)
Admitting MD at bedside.

## 2012-12-23 NOTE — ED Notes (Signed)
Attempted to call report x 1  

## 2012-12-24 ENCOUNTER — Encounter (HOSPITAL_COMMUNITY)
Admission: EM | Disposition: A | Discharge status: Home / Self Care | Payer: Self-pay | Source: Home / Self Care | Attending: Cardiology

## 2012-12-24 ENCOUNTER — Encounter (HOSPITAL_COMMUNITY): Payer: Self-pay | Admitting: General Practice

## 2012-12-24 DIAGNOSIS — I2510 Atherosclerotic heart disease of native coronary artery without angina pectoris: Secondary | ICD-10-CM

## 2012-12-24 DIAGNOSIS — I350 Nonrheumatic aortic (valve) stenosis: Secondary | ICD-10-CM | POA: Insufficient documentation

## 2012-12-24 HISTORY — PX: LEFT AND RIGHT HEART CATHETERIZATION WITH CORONARY ANGIOGRAM: SHX5449

## 2012-12-24 HISTORY — PX: CARDIAC CATHETERIZATION: SHX172

## 2012-12-24 LAB — HEMOGLOBIN A1C: Hgb A1c MFr Bld: 5.6 % (ref <5.7)

## 2012-12-24 LAB — CBC
HCT: 34.4 % — ABNORMAL LOW (ref 39.0–52.0)
Hemoglobin: 11.9 g/dL — ABNORMAL LOW (ref 13.0–17.0)
MCH: 30.3 pg (ref 26.0–34.0)
MCHC: 34.6 g/dL (ref 30.0–36.0)
MCV: 87.5 fL (ref 78.0–100.0)
Platelets: 138 10*3/uL — ABNORMAL LOW (ref 150–400)
RBC: 3.93 MIL/uL — ABNORMAL LOW (ref 4.22–5.81)
RDW: 13.8 % (ref 11.5–15.5)
WBC: 7.1 10*3/uL (ref 4.0–10.5)

## 2012-12-24 LAB — COMPREHENSIVE METABOLIC PANEL
BUN: 16 mg/dL (ref 6–23)
CO2: 25 mEq/L (ref 19–32)
Chloride: 105 mEq/L (ref 96–112)
Creatinine, Ser: 0.69 mg/dL (ref 0.50–1.35)
GFR calc Af Amer: >90 mL/min (ref >90)
GFR calc non Af Amer: >90 mL/min (ref >90)
Glucose, Bld: 118 mg/dL — ABNORMAL HIGH (ref 70–99)
Total Bilirubin: 0.5 mg/dL (ref 0.3–1.2)

## 2012-12-24 LAB — CREATININE, SERUM
Creatinine, Ser: 0.61 mg/dL (ref 0.50–1.35)
GFR calc Af Amer: >90 mL/min (ref >90)
GFR calc non Af Amer: >90 mL/min (ref >90)

## 2012-12-24 LAB — POCT I-STAT 3, ART BLOOD GAS (G3+)
Bicarbonate: 25.2 meq/L — ABNORMAL HIGH (ref 20.0–24.0)
O2 Saturation: 97 %
TCO2: 26 mmol/L (ref 0–100)
pCO2 arterial: 40.9 mmHg (ref 35.0–45.0)
pH, Arterial: 7.397 (ref 7.350–7.450)
pO2, Arterial: 87 mmHg (ref 80.0–100.0)

## 2012-12-24 LAB — POCT I-STAT 3, VENOUS BLOOD GAS (G3P V)
O2 Saturation: 69 %
pCO2, Ven: 45.7 mmHg (ref 45.0–50.0)
pH, Ven: 7.374 — ABNORMAL HIGH (ref 7.250–7.300)
pO2, Ven: 37 mmHg (ref 30.0–45.0)

## 2012-12-24 LAB — PROTIME-INR
INR: 1.65 — ABNORMAL HIGH (ref 0.00–1.49)
Prothrombin Time: 16.8 seconds — ABNORMAL HIGH (ref 11.6–15.2)

## 2012-12-24 LAB — POCT ACTIVATED CLOTTING TIME: Activated Clotting Time: 206 seconds

## 2012-12-24 SURGERY — LEFT AND RIGHT HEART CATHETERIZATION WITH CORONARY ANGIOGRAM
Anesthesia: LOCAL

## 2012-12-24 MED ORDER — LOSARTAN POTASSIUM 50 MG PO TABS
100.0000 mg | ORAL_TABLET | Freq: Every day | ORAL | Status: DC
  Administered 2012-12-24 – 2012-12-25 (×2): 100 mg via ORAL
  Filled 2012-12-24 (×2): qty 2

## 2012-12-24 MED ORDER — HEPARIN (PORCINE) IN NACL 2-0.9 UNIT/ML-% IJ SOLN
INTRAMUSCULAR | Status: AC
  Filled 2012-12-24: qty 1000

## 2012-12-24 MED ORDER — VERAPAMIL HCL 2.5 MG/ML IV SOLN
INTRAVENOUS | Status: AC
  Filled 2012-12-24: qty 2

## 2012-12-24 MED ORDER — MIDAZOLAM HCL 2 MG/2ML IJ SOLN
INTRAMUSCULAR | Status: AC
  Filled 2012-12-24: qty 2

## 2012-12-24 MED ORDER — FENTANYL CITRATE 0.05 MG/ML IJ SOLN
INTRAMUSCULAR | Status: AC
Start: 2012-12-24 — End: 2012-12-24
  Filled 2012-12-24: qty 2

## 2012-12-24 MED ORDER — OXYCODONE HCL 5 MG PO TABS
ORAL_TABLET | ORAL | Status: AC
  Filled 2012-12-24: qty 1

## 2012-12-24 MED ORDER — SODIUM CHLORIDE 0.9 % IV SOLN: INTRAVENOUS | Status: AC

## 2012-12-24 MED ORDER — ENOXAPARIN SODIUM 40 MG/0.4ML ~~LOC~~ SOLN
40.0000 mg | SUBCUTANEOUS | Status: DC
  Administered 2012-12-25: 40 mg via SUBCUTANEOUS
  Filled 2012-12-24 (×2): qty 0.4

## 2012-12-24 MED ORDER — NITROGLYCERIN 0.2 MG/ML ON CALL CATH LAB
INTRAVENOUS | Status: AC
  Filled 2012-12-24: qty 1

## 2012-12-24 NOTE — Progress Notes (Signed)
   Subjective:  Denies CP or dyspnea   Objective:  Filed Vitals:   12/23/12 2032 12/23/12 2100 12/23/12 2153 12/24/12 0444  BP: 162/71 160/71 149/77 134/62  Pulse: 91 92 91 67  Temp: 97.7 F (36.5 C)  98.3 F (36.8 C) 97.5 F (36.4 C)  TempSrc: Axillary  Oral Oral  Resp: 14  18 18   Height:   6' 2"  (1.88 m)   Weight:   360 lb 1.6 oz (163.34 kg)   SpO2: 98% 100% 99% 99%    Intake/Output from previous day:  Intake/Output Summary (Last 24 hours) at 12/24/12 0729 Last data filed at 12/24/12 0230  Gross per 24 hour  Intake      0 ml  Output    600 ml  Net   -600 ml    Physical Exam: Physical exam: Well-developed obese in no acute distress.  Skin is warm and dry.  HEENT is normal.  Neck is supple.  Chest is clear to auscultation with normal expansion.  Cardiovascular exam is regular rate and rhythm. 3/6 systolic murmur Abdominal exam nontender or distended. No masses palpated. Extremities show chronic skin changes and trace edema. neuro grossly intact    Lab Results: Basic Metabolic Panel:  Recent Labs  12/23/12 1801 12/23/12 2217  NA 143  --   K 3.4*  --   CL 104  --   CO2 29  --   GLUCOSE 121*  --   BUN 15  --   CREATININE 0.71 0.73  CALCIUM 9.4  --    CBC:  Recent Labs  12/23/12 1801 12/23/12 2217  WBC 6.7 6.0  HGB 12.4* 11.7*  HCT 36.0* 34.2*  MCV 88.0 88.1  PLT 164 158   Cardiac Enzymes:  Recent Labs  12/23/12 2216  TROPONINI <0.30     Assessment/Plan:  1 unstable angina-patient symptoms are consistent with new onset angina. Coumadin is on hold and patient received vitamin K yesterday. Await results of today's INR. If less than or equal to 1.6 proceed with cardiac catheterization. The risks and benefits were discussed and the patient agrees to proceed. Continue aspirin and statin. 2 aortic stenosis-patient's last echocardiogram in 2012 showed mild to moderate aortic stenosis. His exam is suggestive of more severe disease. Repeat  echocardiogram. 3 hypertension-change Cozaar to 100 mg by mouth daily. Continue beta blocker. Hold Lasix in anticipation of cardiac catheterization. Follow blood pressure and add Norvasc if needed. 4 paroxysmal atrial fibrillation-the patient remains in sinus rhythm. Continue beta blocker. Resume Coumadin following catheterization. 5 obstructive sleep apnea  Kirk Ruths 12/24/2012, 7:29 AM

## 2012-12-24 NOTE — Interval H&P Note (Signed)
History and Physical Interval Note:  12/24/2012 4:27 PM  Tim [name]  has presented today for surgery, with the diagnosis of chf  The various methods of treatment have been discussed with the patient and family. After consideration of risks, benefits and other options for treatment, the patient has consented to  Procedure(s): LEFT AND RIGHT HEART CATHETERIZATION WITH CORONARY ANGIOGRAM ([name]) as a surgical intervention .  The patient's history has been reviewed, patient examined, no change in status, stable for surgery.  I have reviewed the patient's chart and labs.  Questions were answered to the patient's satisfaction.     Collier Salina [facility] 12/24/2012 4:27 PM

## 2012-12-24 NOTE — Progress Notes (Addendum)
Called MD - pt. INR is 1.73. Plan was to go to Cath lab. Maintaining NPO status until further instructions.

## 2012-12-24 NOTE — Progress Notes (Signed)
Utilization review completed.  

## 2012-12-24 NOTE — CV Procedure (Signed)
   Cardiac Catheterization Procedure Note  Name: Tim [name] MRN: [id number] DOB: [birth year]  Procedure: Right Heart Cath, Left Heart Cath, Selective Coronary Angiography, LV angiography  Indication: 72 yo WM with history of mild to moderate aortic stenosis presents with symptoms of chest pain concerning for unstable angina.   Procedural Details: The right wrist was prepped, draped, and anesthetized with 1% lidocaine. Using the modified Seldinger technique a 5 French sheath was placed in the right radial artery and a 5 French sheath was placed in the right brachial vein. 3 mg IA verapamil was given via the right radial catheter. A Swan-Ganz catheter was used for the right heart catheterization. Standard protocol was followed for recording of right heart pressures and sampling of oxygen saturations. Fick cardiac output was calculated. Standard Judkins catheters were used for selective coronary angiography and left ventriculography. There were no immediate procedural complications. The patient was transferred to the post catheterization recovery area for further monitoring.  Procedural Findings: Hemodynamics RA 17/18 mean 13 mm Hg RV 56/15 mmm Hg PA 51/26 mean 37 mm Hg PCWP 25/27 mean 20 mm Hg LV 165/22 mm Hg AO 142/77 mean 107 mm Hg  Aortic valve mean gradient: 20 mm Hg Aortic valve area: 1.4 cm2, index .51  Oxygen saturations: PA 69% AO 97%  Cardiac Output (Fick) 8.3 L/min  Cardiac Index (Fick) 3.0 L/min/m2   Coronary angiography: Coronary dominance: right  Left mainstem: Normal  Left anterior descending (LAD): Normal.  Left circumflex (LCx): The circumflex gives rise to a moderate first OM then a larger branching OM2. There is mild disease less than 20%.  Right coronary artery (RCA): dominant. 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   Left ventriculography: Left ventricular systolic function is normal, LVEF is estimated at 55-65%, there  is no significant mitral regurgitation   Final Conclusions:   1. Mild nonobstructive CAD 2. Normal LV function. 3. Moderate pulmonary HTN. 4. Moderate aortic stenosis  Recommendations: Medical management.   Collier Salina [facility] 12/24/2012, 5:29 PM

## 2012-12-24 NOTE — H&P (View-Only) (Signed)
   Subjective:  Denies CP or dyspnea   Objective:  Filed Vitals:   12/23/12 2032 12/23/12 2100 12/23/12 2153 12/24/12 0444  BP: 162/71 160/71 149/77 134/62  Pulse: 91 92 91 67  Temp: 97.7 F (36.5 C)  98.3 F (36.8 C) 97.5 F (36.4 C)  TempSrc: Axillary  Oral Oral  Resp: 14  18 18   Height:   6' 2"  (1.88 m)   Weight:   360 lb 1.6 oz (163.34 kg)   SpO2: 98% 100% 99% 99%    Intake/Output from previous day:  Intake/Output Summary (Last 24 hours) at 12/24/12 0729 Last data filed at 12/24/12 0230  Gross per 24 hour  Intake      0 ml  Output    600 ml  Net   -600 ml    Physical Exam: Physical exam: Well-developed obese in no acute distress.  Skin is warm and dry.  HEENT is normal.  Neck is supple.  Chest is clear to auscultation with normal expansion.  Cardiovascular exam is regular rate and rhythm. 3/6 systolic murmur Abdominal exam nontender or distended. No masses palpated. Extremities show chronic skin changes and trace edema. neuro grossly intact    Lab Results: Basic Metabolic Panel:  Recent Labs  12/23/12 1801 12/23/12 2217  NA 143  --   K 3.4*  --   CL 104  --   CO2 29  --   GLUCOSE 121*  --   BUN 15  --   CREATININE 0.71 0.73  CALCIUM 9.4  --    CBC:  Recent Labs  12/23/12 1801 12/23/12 2217  WBC 6.7 6.0  HGB 12.4* 11.7*  HCT 36.0* 34.2*  MCV 88.0 88.1  PLT 164 158   Cardiac Enzymes:  Recent Labs  12/23/12 2216  TROPONINI <0.30     Assessment/Plan:  1 unstable angina-patient symptoms are consistent with new onset angina. Coumadin is on hold and patient received vitamin K yesterday. Await results of today's INR. If less than or equal to 1.6 proceed with cardiac catheterization. The risks and benefits were discussed and the patient agrees to proceed. Continue aspirin and statin. 2 aortic stenosis-patient's last echocardiogram in 2012 showed mild to moderate aortic stenosis. His exam is suggestive of more severe disease. Repeat  echocardiogram. 3 hypertension-change Cozaar to 100 mg by mouth daily. Continue beta blocker. Hold Lasix in anticipation of cardiac catheterization. Follow blood pressure and add Norvasc if needed. 4 paroxysmal atrial fibrillation-the patient remains in sinus rhythm. Continue beta blocker. Resume Coumadin following catheterization. 5 obstructive sleep apnea  Kirk Ruths 12/24/2012, 7:29 AM

## 2012-12-25 ENCOUNTER — Encounter (HOSPITAL_COMMUNITY): Payer: Self-pay

## 2012-12-25 ENCOUNTER — Observation Stay (HOSPITAL_COMMUNITY): Payer: Medicare Other

## 2012-12-25 DIAGNOSIS — R0989 Other specified symptoms and signs involving the circulatory and respiratory systems: Secondary | ICD-10-CM

## 2012-12-25 DIAGNOSIS — I359 Nonrheumatic aortic valve disorder, unspecified: Secondary | ICD-10-CM

## 2012-12-25 DIAGNOSIS — I350 Nonrheumatic aortic (valve) stenosis: Secondary | ICD-10-CM

## 2012-12-25 DIAGNOSIS — I2510 Atherosclerotic heart disease of native coronary artery without angina pectoris: Secondary | ICD-10-CM

## 2012-12-25 DIAGNOSIS — I480 Paroxysmal atrial fibrillation: Secondary | ICD-10-CM

## 2012-12-25 DIAGNOSIS — E46 Unspecified protein-calorie malnutrition: Secondary | ICD-10-CM

## 2012-12-25 LAB — CBC
HCT: 32.8 % — ABNORMAL LOW (ref 39.0–52.0)
Hemoglobin: 11.1 g/dL — ABNORMAL LOW (ref 13.0–17.0)
MCH: 29.9 pg (ref 26.0–34.0)
MCV: 88.4 fL (ref 78.0–100.0)
Platelets: 133 10*3/uL — ABNORMAL LOW (ref 150–400)
RBC: 3.71 MIL/uL — ABNORMAL LOW (ref 4.22–5.81)
WBC: 5.9 10*3/uL (ref 4.0–10.5)

## 2012-12-25 LAB — BASIC METABOLIC PANEL
CO2: 26 mEq/L (ref 19–32)
Chloride: 104 mEq/L (ref 96–112)
Glucose, Bld: 136 mg/dL — ABNORMAL HIGH (ref 70–99)
Potassium: 3.8 mEq/L (ref 3.5–5.1)
Sodium: 138 mEq/L (ref 135–145)

## 2012-12-25 MED ORDER — APIXABAN 5 MG PO TABS: 5.0000 mg | ORAL_TABLET | Freq: Two times a day (BID) | ORAL | Status: DC

## 2012-12-25 MED ORDER — HYDROMORPHONE HCL PF 1 MG/ML IJ SOLN
INTRAMUSCULAR | Status: AC
  Administered 2012-12-25: 1 mg via INTRAVENOUS
  Filled 2012-12-25: qty 1

## 2012-12-25 MED ORDER — LOSARTAN POTASSIUM 100 MG PO TABS: 100.0000 mg | ORAL_TABLET | Freq: Every day | ORAL | Status: DC

## 2012-12-25 MED ORDER — HYDROMORPHONE HCL PF 1 MG/ML IJ SOLN: 1.0000 mg | Freq: Once | INTRAMUSCULAR | Status: AC

## 2012-12-25 MED ORDER — FUROSEMIDE 40 MG PO TABS
40.0000 mg | ORAL_TABLET | Freq: Two times a day (BID) | ORAL | Status: DC
  Administered 2012-12-25: 40 mg via ORAL
  Filled 2012-12-25 (×3): qty 1

## 2012-12-25 MED ORDER — POTASSIUM CHLORIDE CRYS ER 20 MEQ PO TBCR
20.0000 meq | EXTENDED_RELEASE_TABLET | Freq: Every day | ORAL | Status: DC
Start: 2012-12-25 — End: 2013-05-05

## 2012-12-25 NOTE — Progress Notes (Signed)
Echo Lab  2D Echocardiogram completed.  Sher Shampine L Diannia Hogenson, RDCS 12/25/2012 10:40 AM

## 2012-12-25 NOTE — Care Management Note (Signed)
    Page 1 of 1   12/25/2012     4:04:33 PM   CARE MANAGEMENT NOTE 12/25/2012  Patient:  Tim [name], Tim [name]   Account Number:  [id number]  Date Initiated:  12/25/2012  Documentation initiated by:  Marvetta Gibbons  Subjective/Objective Assessment:   Pt admitted with c/p CHF s/p cath     Action/Plan:   PTA pt lived at home with spouse- independent   Anticipated DC Date:  12/25/2012   Anticipated DC Plan:  [place]  CM consult      Choice offered to / List presented to:             Status of service:  Completed, signed off Medicare Important Message given?   (If response is "NO", the following Medicare IM given date fields will be blank) Date Medicare IM given:   Date Additional Medicare IM given:    Discharge Disposition:  HOME/SELF CARE  Per UR Regulation:  Reviewed for med. necessity/level of care/duration of stay  If discussed at [place] of Stay Meetings, dates discussed:    Comments:  12/25/12- 1545- Marvetta Gibbons RN, BSN [phone] Pt for d/c today- to start Eliquis - benefits check completed and per [employer] pt does not have coverage for Eliquis- spoke with pt at bedside regarding potential cost of drug- pt states that he usually ends up in donut hole- offered free 30 day trial but declined and also discussed potential assistance through drug company but pt feels like he most likely would not qualify due to his income- pt declined assistance forms. Explained to pt that if cost of drug too much when his pharmacy runs script- he needs to call his MD to discuss options- pt states he feels like his insurance will cover it when his pharmacy runs the prescription through if not he will f/u with his MD to see about his options- at this time pt states he is prepared to pay out of pocket for drug if needed.

## 2012-12-25 NOTE — Progress Notes (Signed)
*  PRELIMINARY RESULTS* Vascular Ultrasound Carotid Duplex (Doppler) has been completed.   There is no obvious evidence of hemodynamically significant internal carotid artery stenosis >40%. Vertebral arteries are patent with antegrade flow.  12/25/2012 10:33 AM Maudry Mayhew, RVT, RDCS, RDMS

## 2012-12-25 NOTE — Telephone Encounter (Signed)
Pt is currently admitted

## 2012-12-25 NOTE — Progress Notes (Signed)
   Subjective:  Denies CP or dyspnea   Objective:  Filed Vitals:   12/24/12 1654 12/24/12 1949 12/24/12 2131 12/25/12 0610  BP:  127/54  148/65  Pulse: 69 64  62  Temp:  97.5 F (36.4 C)  97.8 F (36.6 C)  TempSrc:      Resp:  20  20  Height:      Weight:      SpO2:  98% 98% 98%    Intake/Output from previous day:  Intake/Output Summary (Last 24 hours) at 12/25/12 0653 Last data filed at 12/24/12 2200  Gross per 24 hour  Intake      0 ml  Output   1400 ml  Net  -1400 ml    Physical Exam: Physical exam: Well-developed obese in no acute distress.  Skin is warm and dry.  HEENT is normal.  Neck is supple.  Chest is clear to auscultation with normal expansion.  Cardiovascular exam is regular rate and rhythm. 3/6 systolic murmur Abdominal exam nontender or distended. No masses palpated. Extremities show chronic skin changes and trace edema. Radial cath site with no hematoma neuro grossly intact    Lab Results: Basic Metabolic Panel:  Recent Labs  12/24/12 0726 12/24/12 2044 12/25/12 0517  NA 140  --  138  K 3.6  --  3.8  CL 105  --  104  CO2 25  --  26  GLUCOSE 118*  --  136*  BUN 16  --  11  CREATININE 0.69 0.61 0.64  CALCIUM 8.5  --  8.5   CBC:  Recent Labs  12/24/12 2044 12/25/12 0517  WBC 7.1 5.9  HGB 11.9* 11.1*  HCT 34.4* 32.8*  MCV 87.5 88.4  PLT 138* 133*   Cardiac Enzymes:  Recent Labs  12/23/12 2216 12/24/12 0726  TROPONINI <0.30 <0.30     Assessment/Plan:  1 unstable angina-Cath results noted; nonobstructive CAD; plan medical therapy; no ASA given need for anticoagulation; continue statin. 2 aortic stenosis-patient's last echocardiogram in 2012 showed mild to moderate aortic stenosis. His exam is suggestive of more severe disease. Await repeat echocardiogram. AS moderate by cath. 3 hypertension-continue Cozaar 100 mg by mouth daily. Continue beta blocker. Resume lasix. Follow blood pressure and add Norvasc if needed. 4  paroxysmal atrial fibrillation-the patient remains in sinus rhythm. Continue beta blocker. DC coumadin. Begin apixaban 5 mg po BID tomorrow AM. Check CBC and BMET 4 weeks. 5 obstructive sleep apnea 6 abnormal chest xray - check noncontrast chest CT. If AS not severe on echo and chest CT ok, DC today; could have carotid dopplers as outpt. FU with me 4 weeks in [place]. FU anemia with primary care. > 30 min PA and physician time D2 Kirk Ruths 12/25/2012, 6:53 AM

## 2012-12-25 NOTE — Discharge Summary (Signed)
See progress notes Brian Crenshaw  

## 2012-12-25 NOTE — Discharge Summary (Signed)
Discharge Summary   Patient ID: Tim [name],  MRN: [id number], DOB/AGE: 73-11-1940 73 y.o.  Admit date: 12/23/2012 Discharge date: 12/25/2012  Primary Physician: Adella Hare, MD Primary Cardiologist: B. Stanford Breed, MD  Discharge Diagnoses Principal Problem:   Chest pain with high risk for cardiac etiology  - Symptoms c/w unstable angina on presentation  - Trop-I WNL x 3  - Diagnostic cardiac catheterization 12/24/12- mild < 20% LCx, prox 30% RCA; LVEF 55-65% , moderate pulmonary HTN, moderate AS  - Medical management recommended Active Problems:   CAD (coronary artery disease), native coronary artery  - Diffuse nonobstructive as noted above  - Two previous cardiac caths with similar findings   Moderate aortic stenosis  - 2D echo 12/25/12: moderate AS (VTI 1.08 cm^2, Vmax 1.11 cm ^2), mild LA dilatation   Abnormal chest x-ray  - Question of left pulmonary nodule  - Not evident on follow-up noncontrast chest CT   Paroxysmal atrial fibrillation  - Coumadin replaced with apixaban 5 mg PO BID to begin tomorrow (12/26/12)   OBESITY   OBSTRUCTIVE SLEEP APNEA  - On CPAP   Essential hypertension, benign  - Losartan increased to 140m daily  - Lasix, BB continued  - BP well-controlled at discharge  - Consider adding Norvasc if further BP control needed in the future (goal < 150/90 per new ACC guidelines)   VENOUS INSUFFICIENCY   GERD   COPD   PULMONARY HYPERTENSION, HX OF   Long term current use of anticoagulant   Protein malnutrition  - Albumin 2.1 on admission     Allergies Allergies  Allergen Reactions  . Brimonidine Tartrate     Shortness of breath  . Brinzolamide     Shortness of breath  . Celecoxib     confusion  . Codeine     Childhood reaction  . Colchicine     diarrhea  . Diltiazem      leg swelling  . Latanoprost     Shortness of breath  . Rofecoxib      leg swelling  . Timolol Maleate     Aggravated asthma  . Vancomycin     hives/blisters     Diagnostic Studies/Procedures  PA/LATERAL CHEST X-RAY - 12/23/12  IMPRESSION:  1. No acute cardiopulmonary process demonstrated.  2. Cannot exclude developing left lung nodule, although this could  be secondary to an overlap of vascular and osseous structures.  This could be addressed with short-term radiographic follow-up.  However, if clinically warranted based on risk factors for lung  cancer, chest CT should be considered.  CARDIAC CATHETERIZATION - 12/24/12  Hemodynamics  RA 17/18 mean 13 mm Hg  RV 56/15 mmm Hg  PA 51/26 mean 37 mm Hg  PCWP 25/27 mean 20 mm Hg  LV 165/22 mm Hg  AO 142/77 mean 107 mm Hg  Aortic valve mean gradient: 20 mm Hg  Aortic valve area: 1.4 cm2, index .51   Oxygen saturations:  PA 69%  AO 97%  Cardiac Output (Fick) 8.3 L/min  Cardiac Index (Fick) 3.0 L/min/m2  Coronary angiography:  Coronary dominance: right  Left mainstem: Normal  Left anterior descending (LAD): Normal.  Left circumflex (LCx): The circumflex gives rise to a moderate first OM then a larger branching OM2. There is mild disease less than 20%.  Right coronary artery (RCA): dominant. 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  Left ventriculography: Left ventricular systolic function is normal, LVEF is estimated at 55-65%, there is no  significant mitral regurgitation   Final Conclusions:  1. Mild nonobstructive CAD  2. Normal LV function.  3. Moderate pulmonary HTN.  4. Moderate aortic stenosis  NONCONTRAST CHEST CT - 12/25/12  IMPRESSION:  1. There are no suspicious pulmonary nodules or masses noted.  2. Prominent coronary artery calcifications.  TRANSTHORACIC ECHOCARDIOGRAM - 12/25/12  - Aortic valve: There was moderate stenosis. Valve area: 1.08cm^2(VTI). Valve area: 1.11cm^2 (Vmax). - Mitral valve: Calcified annulus. Mildly thickened leaflets . - Left atrium: The atrium was mildly dilated.  - Atrial septum: No defect or patent foramen  ovale was identified.  CAROTID DOPPLER ULTRASOUND- PRELIMINARY REPORT - 12/25/12  There is no obvious evidence of hemodynamically significant internal carotid artery stenosis >40%. Vertebral arteries are patent with antegrade flow.  History of Present Illness Tim [name] is a 72 y.o. male who was admitted to [name] on 12/23/12 with the above problem list.   He has a past medical history significant for nonobstructive CAD (per two prior caths, most recently 2007), PAF (on chronic Coumadin anticoagulation), HTN, HLD, obesity, COPD, OSA, resultant pulmonary HTN, GERD and PUD.   He reported experiencing substernal chest pressure without radation, aggravated by exertion five days leading up to admission. The discomfort would resolve after 5 minutes of rest. The day of admission, he was walking up a hill when he experienced a more severe episode (4/10). He thus [facility] ED.   There, EKG revealed NSR and no acute ischemic changes. Initial TnI returned WNL.CXR as above revealed no acute cardiopulmonary process, but did reveal a questionable left lung abnormality- pulmonary nodule could not be ruled out and follow-up chest CT was recommended to further evaluate. Cardiology was consulted and the decision was made to proceed with diagnostic cardiac catheterization the following day. INR was mildly subtherapeutic at 1.72. Coumadin was held, and the patient did receive a dose of vitamin K.   Hospital Course   Two subsequent sets of troponin-I returned WNL. INR the following day was 1.73. This was deemed to be an acceptable range, and the patient was informed, consented and prepped for cardiac catheterization. As above, this revealed nonobstructive CAD, moderate pulmonary HTN and moderate AS. LVEF 55-65%. He tolerated the proceudure well without complications. The decision was made to treat medically.   He remained stable overnight and was evaluated by Dr. Stanford Breed the following  morning. He was noted to have a prominent systolic murmur. Given the patient's history of AS and suspicion of progressive stenosis, a 2D echocardiogram was ordered to evaluate further. As above, this indicated moderate AS. Carotid doppler u/s revealed no hemodynamically significant flow-limiting stenoses. A follow-up noncontrast chest CT revealed no evidence of pulmonary nodules or masses and prominent coronary artery calcifications. The patient was deemed stable for discharge given the findings of these studies.   The patient will be discharged on the medication regimen outlined below. Cozaar was increased to 18m daily. Apixaban will be added to start tomorrow. He will follow-up with Dr. CStanford Breedas scheduled below. Labwork- CBC and BMET will be obtained 01/06/13 prior to follow-up. This information, including supplemental post-cath instructions and activity restrictions, has been clearly outlined in the discharge AVS.   Discharge Vitals:  Blood pressure 119/51, pulse 67, temperature 97.8 F (36.6 C), temperature source Oral, resp. rate 20, height 6' 2"  (1.88 m), weight 163.34 kg (360 lb 1.6 oz), SpO2 100.00%.   Labs: Recent Labs     12/24/12  2044  12/25/12  07619  WBC  7.1  5.9  HGB  11.9*  11.1*  HCT  34.4*  32.8*  MCV  87.5  88.4  PLT  138*  133*   Recent Labs Lab 12/23/12 1801  12/24/12 0726 12/24/12 2044 12/25/12 0517  NA 143  --  140  --  138  K 3.4*  --  3.6  --  3.8  CL 104  --  105  --  104  CO2 29  --  25  --  26  BUN 15  --  16  --  11  CREATININE 0.71  < > 0.69 0.61 0.64  CALCIUM 9.4  --  8.5  --  8.5  PROT  --   --  6.0  --   --   BILITOT  --   --  0.5  --   --   ALKPHOS  --   --  55  --   --   ALT  --   --  16  --   --   AST  --   --  19  --   --   GLUCOSE 121*  --  118*  --  136*  < > = values in this interval not displayed. Recent Labs     12/23/12  2217  HGBA1C  5.6   Recent Labs     12/23/12  2216  12/24/12  0726  TROPONINI  <0.30  <0.30    Disposition:  Discharge Orders   Future Appointments Provider Department Dept Phone   01/02/2013 12:45 PM [facility] [place] [phone]   01/02/2013 1:00 PM Charlett Blake, [facility] [phone]   01/06/2013 8:20 AM [facility] [place] [facility] [employer]) [phone]   01/08/2013 3:45 PM Lelon Perla, MD [facility] [place] [phone]   01/16/2013 4:00 PM [facility] [phone]   Future Orders Complete By Expires     Basic Metabolic Panel (BMET)  10/09/7024 12/25/2013    Questions:      Has the patient fasted?:      CBC  01/06/2013 12/25/2013    Diet - low sodium heart healthy  As directed     Increase activity slowly  As directed           Follow-up Information   Follow up with [facility] On 01/08/2013. (At 3:45 PM for follow-up with Dr. Kirk Ruths. )    Contact information:   [address] [place] [address]       Follow up with [facility] On 01/06/2013. (For labwork. )    Contact information:   [place] [phone]       Follow up with Adella Hare, MD In 1 week. (For post-hospital follow-up. )    Contact information:   [address] [place] [address] [phone]       Discharge Medications:    Medication List    STOP taking these medications       warfarin 5 MG tablet  Commonly known as:  COUMADIN      TAKE these medications       albuterol 108 (90 BASE) MCG/ACT inhaler  Commonly known as:  PROVENTIL HFA;VENTOLIN HFA  Inhale 2 puffs into the lungs 4 (four) times daily as needed for wheezing or shortness of breath.     allopurinol 300 MG tablet  Commonly known  as:  ZYLOPRIM  Take 300 mg by mouth daily.     apixaban 5 MG Tabs tablet  Commonly known as:  ELIQUIS  Take 1 tablet (5 mg total) by mouth 2 (two) times daily.   Start taking on:  12/26/2012     b complex vitamins tablet  Take 1 tablet by mouth at bedtime.     B-complex with vitamin C tablet  Take 1 tablet by mouth at bedtime.     ezetimibe 10 MG tablet  Commonly known as:  ZETIA  Take 10 mg by mouth daily.     finasteride 5 MG tablet  Commonly known as:  PROSCAR  Take 1 tablet (5 mg total) by mouth daily.     Fish Oil 1000 MG Caps  Take 2,000 mg by mouth 2 (two) times daily.     Fluticasone-Salmeterol 250-50 MCG/DOSE Aepb  Commonly known as:  ADVAIR  Inhale 1 puff into the lungs every 12 (twelve) hours.     furosemide 40 MG tablet  Commonly known as:  LASIX  Take 1 tablet (40 mg total) by mouth 2 (two) times daily.     HYDROcodone-acetaminophen 7.5-325 MG per tablet  Commonly known as:  NORCO  Take 1 tablet by mouth at bedtime.     losartan 100 MG tablet  Commonly known as:  COZAAR  Take 1 tablet (100 mg total) by mouth daily.     metoprolol 200 MG 24 hr tablet  Commonly known as:  TOPROL-XL  Take 200 mg by mouth 2 (two) times daily.     multivitamin with minerals Tabs  Take 1 tablet by mouth daily.     niacin 1000 MG CR tablet  Commonly known as:  NIASPAN  Take 1,000 mg by mouth 2 (two) times daily.     omeprazole 10 MG capsule  Commonly known as:  PRILOSEC  Take 1 capsule (10 mg total) by mouth daily.     oxyCODONE-acetaminophen 10-325 MG per tablet  Commonly known as:  PERCOCET  Take 1 tablet by mouth every 4 (four) hours as needed for pain.     potassium chloride SA 20 MEQ tablet  Commonly known as:  K-DUR,KLOR-CON  Take 1 tablet (20 mEq total) by mouth daily.     rosuvastatin 40 MG tablet  Commonly known as:  CRESTOR  Take 40 mg by mouth at bedtime.     VITAMIN C PO  Take 1 tablet by mouth at bedtime.     vitamin E 200 UNIT capsule  Take 200 Units by mouth at bedtime.       Outstanding Labs/Studies: BMET, CBC 01/06/13  Duration of Discharge Encounter: Greater than 30 minutes including physician  time.  Signed, R. Valeria Batman, PA-C 12/25/2012, 12:50 PM

## 2013-01-02 ENCOUNTER — Ambulatory Visit: Payer: Medicare Other | Admitting: Physical Medicine and Rehabilitation

## 2013-01-02 ENCOUNTER — Other Ambulatory Visit: Payer: Self-pay | Admitting: Physical Medicine and Rehabilitation

## 2013-01-02 ENCOUNTER — Ambulatory Visit (INDEPENDENT_AMBULATORY_CARE_PROVIDER_SITE_OTHER): Payer: Medicare Other | Admitting: Cardiology

## 2013-01-02 ENCOUNTER — Ambulatory Visit: Payer: Medicare Other | Admitting: Physical Medicine & Rehabilitation

## 2013-01-02 DIAGNOSIS — Z5181 Encounter for therapeutic drug level monitoring: Secondary | ICD-10-CM

## 2013-01-02 DIAGNOSIS — I4891 Unspecified atrial fibrillation: Secondary | ICD-10-CM

## 2013-01-02 DIAGNOSIS — D649 Anemia, unspecified: Secondary | ICD-10-CM

## 2013-01-02 LAB — CBC WITH DIFFERENTIAL/PLATELET
Eosinophils Relative: 0.7 % (ref 0.0–5.0)
HCT: 40.4 % (ref 39.0–52.0)
Lymphs Abs: 2 10*3/uL (ref 0.7–4.0)
Monocytes Relative: 10.5 % (ref 3.0–12.0)
Platelets: 249 10*3/uL (ref 150.0–400.0)
RBC: 4.38 Mil/uL (ref 4.22–5.81)
WBC: 8.8 10*3/uL (ref 4.5–10.5)

## 2013-01-02 LAB — BASIC METABOLIC PANEL
BUN: 23 mg/dL (ref 6–23)
GFR: 84.63 mL/min (ref >60.00)
Potassium: 4.5 mEq/L (ref 3.5–5.1)
Sodium: 142 mEq/L (ref 135–145)

## 2013-01-06 ENCOUNTER — Other Ambulatory Visit: Payer: Medicare Other

## 2013-01-06 ENCOUNTER — Ambulatory Visit (INDEPENDENT_AMBULATORY_CARE_PROVIDER_SITE_OTHER): Payer: Medicare Other | Admitting: Internal Medicine

## 2013-01-06 ENCOUNTER — Encounter: Payer: Self-pay | Admitting: Internal Medicine

## 2013-01-06 VITALS — BP 138/70 | HR 67 | Temp 97.9°F | Resp 8 | Ht 74.0 in | Wt 349.8 lb

## 2013-01-06 DIAGNOSIS — Z7901 Long term (current) use of anticoagulants: Secondary | ICD-10-CM

## 2013-01-06 DIAGNOSIS — R609 Edema, unspecified: Secondary | ICD-10-CM

## 2013-01-06 DIAGNOSIS — I10 Essential (primary) hypertension: Secondary | ICD-10-CM

## 2013-01-06 DIAGNOSIS — I2510 Atherosclerotic heart disease of native coronary artery without angina pectoris: Secondary | ICD-10-CM

## 2013-01-06 DIAGNOSIS — E669 Obesity, unspecified: Secondary | ICD-10-CM

## 2013-01-06 DIAGNOSIS — J45909 Unspecified asthma, uncomplicated: Secondary | ICD-10-CM

## 2013-01-06 NOTE — Assessment & Plan Note (Signed)
Tolerating oral agent well.   Plan Continue present medication

## 2013-01-06 NOTE — Assessment & Plan Note (Signed)
Body mass index is 44.89 kg/(m^2). See above- under cardiovascular risk modification - target weight 300 lbs a 24 month project.

## 2013-01-06 NOTE — Assessment & Plan Note (Signed)
LE edema is much improved.  Plan - continue with knee high support stockings - over the counter type.

## 2013-01-06 NOTE — Assessment & Plan Note (Signed)
BP Readings from Last 3 Encounters:  01/06/13 138/70  12/25/12 122/66  12/25/12 122/66   Good control on present medications - no changes at this time.

## 2013-01-06 NOTE — Progress Notes (Signed)
Subjective:    Patient ID: Tim [name], male    DOB: 1941-03-19, 72 y.o.   MRN: [id number]  HPI Mr. Heinz was hospitalized June 23-25th for typical chest pain. He had a Cardiac cath which was negative for obstructive disease. He had a 2 D echo which was not read as showing any wall motion abnormality. Cardiac enzymes were negative 3+ times. He was discharged in stable condition and has not had recurrent pain , although he has curtailed his activities until he sees Dr. Stanford Breed in the next several days.  On plain x-ray he did have a pulmonary nodule. Follow CT chest w/o contrast was negative for any nodules but he does have a granuloma which is deemed stable.   Risk modification: cholesterol total 108, HDL 52.3, LDL 37!!!. A1C 5.6%  Weight remains an issue.  Past Medical History  Diagnosis Date  . HYPERCHOLESTEROLEMIA-PURE   . GOUT   . OBSTRUCTIVE SLEEP APNEA   . Essential hypertension, benign   . VENOUS INSUFFICIENCY   . ASTHMA   . COPD   . VENTRAL HERNIA   . OSTEOARTHRITIS   . CATARACT, HX OF   . PULMONARY HYPERTENSION, HX OF   . Hallux valgus (acquired)   . Obesity, unspecified   . Osteoarthrosis, unspecified whether generalized or localized, unspecified site   . Unspecified glaucoma(365.9)   . Moderate aortic stenosis     Per 12/2012 echo- (VTI 1.08 cm^2, Vmax 1.11 cm ^2), mild LA dilatation  . Paroxysmal atrial fibrillation 2003  . Lumbosacral neuritis   . Unspecified hereditary and idiopathic peripheral neuropathy   . Lumbosacral spondylosis   . CAD (coronary artery disease)     Nonobstructive by 12/2012 cath  . PUD (peptic ulcer disease)   . BPH (benign prostatic hypertrophy)   . HH (hiatus hernia)   . Glaucoma   . Narcolepsy   . GERD (gastroesophageal reflux disease)   . Personal history of colonic adenoma 10/30/2012   Past Surgical History  Procedure Laterality Date  . Hemorrhoid surgery    . Vitrectomy    . Intraocular lens insertion Bilateral   . Leg  surgery Bilateral   . Knee surgery Right   . Traeculectomy Left     eye  . Shoulder arthroscopy  08/30/2011    Procedure: ARTHROSCOPY SHOULDER;  Surgeon: [name], MD;  Location: [facility];  Service: Orthopedics;  Laterality: Right;  Right Shoulder Arthroscopy, Debridement, and Decompression  . Tonsillectomy    . Colonoscopy    . Cholecystectomy    . Toe amputation    . Colonoscopy [name] 10/30/2012    Procedure: COLONOSCOPY;  Surgeon: [name], MD;  Location: [facility];  Service: Endoscopy;  Laterality: [name];  . Cardiac catheterization  1997    Non-obstructive disease  . Cardiac catheterization  12/24/2012    mild < 20% LCx, prox 30% RCA; LVEF 55-65% , moderate pulmonary HTN, moderate AS   Family History  Problem Relation Age of Onset  . Hypertension Mother   . Coronary artery disease Mother   . Heart attack Mother   . Pulmonary fibrosis Father     asbestosis   History   Social History  . Marital Status: Married    Spouse Name: [name]    Number of Children: 0  . Years of Education: 20   Occupational History  . engineering     retired   Social History Main Topics  . Smoking status: Never Smoker   . Smokeless tobacco:  Never Used  . Alcohol Use: No  . Drug Use: Yes    Special: Oxycodone  . Sexually Active: Not Currently   Other Topics Concern  . Not on file   Social History Narrative   HSG, [name] - BS, [name] - MS-engineering, 2 years on PhD - [place]. Married - '65 - 4yr/divorced; '76- 3 yrs/divorced; '92 . No children. 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 ACP/Living Will - Yes CPR; long-term Mechanical ventilation as long as he was able to cognate; ok for long term artificial nutrition. Precondition being able to cognate and not to have too much pain.     Current Outpatient Prescriptions on File Prior to Visit  Medication Sig Dispense Refill  . albuterol (PROVENTIL HFA;VENTOLIN HFA) 108 (90 BASE) MCG/ACT inhaler Inhale 2  puffs into the lungs 4 (four) times daily as needed for wheezing or shortness of breath.      . allopurinol (ZYLOPRIM) 300 MG tablet Take 300 mg by mouth daily.      .Marland Kitchenapixaban (ELIQUIS) 5 MG TABS tablet Take 1 tablet (5 mg total) by mouth 2 (two) times daily.  60 tablet  3  . Ascorbic Acid (VITAMIN C PO) Take 1 tablet by mouth at bedtime.      .Marland Kitchenb complex vitamins tablet Take 1 tablet by mouth at bedtime.      . B Complex-C (B-COMPLEX WITH VITAMIN C) tablet Take 1 tablet by mouth at bedtime.       .Marland Kitchenezetimibe (ZETIA) 10 MG tablet Take 10 mg by mouth daily.      . finasteride (PROSCAR) 5 MG tablet Take 1 tablet (5 mg total) by mouth daily.  30 tablet  5  . Fluticasone-Salmeterol (ADVAIR) 250-50 MCG/DOSE AEPB Inhale 1 puff into the lungs every 12 (twelve) hours.      . furosemide (LASIX) 40 MG tablet Take 1 tablet (40 mg total) by mouth 2 (two) times daily.  60 tablet  2  . HYDROcodone-acetaminophen (NORCO) 7.5-325 MG per tablet take 1 tablet by mouth every 8 hours if needed for pain *MUST LAST 30 DAYS PER MD  60 tablet  0  . losartan (COZAAR) 100 MG tablet Take 1 tablet (100 mg total) by mouth daily.  30 tablet  3  . metoprolol (TOPROL-XL) 200 MG 24 hr tablet Take 200 mg by mouth 2 (two) times daily.      . Multiple Vitamin (MULTIVITAMIN WITH MINERALS) TABS Take 1 tablet by mouth daily.      . niacin (NIASPAN) 1000 MG CR tablet Take 1,000 mg by mouth 2 (two) times daily.      . Omega-3 Fatty Acids (FISH OIL) 1000 MG CAPS Take 2,000 mg by mouth 2 (two) times daily.      .Marland Kitchenomeprazole (PRILOSEC) 10 MG capsule Take 1 capsule (10 mg total) by mouth daily.  30 capsule  11  . oxyCODONE-acetaminophen (PERCOCET) 10-325 MG per tablet Take 1 tablet by mouth every 4 (four) hours as needed for pain.      . potassium chloride SA (K-DUR,KLOR-CON) 20 MEQ tablet Take 1 tablet (20 mEq total) by mouth daily.  30 tablet  3  . rosuvastatin (CRESTOR) 40 MG tablet Take 20 mg by mouth 2 (two) times daily.       .  vitamin E 200 UNIT capsule Take 200 Units by mouth at bedtime.        No current facility-administered medications on file prior to visit.  Review of Systems System review is negative for any constitutional, cardiac, pulmonary, GI or neuro symptoms or complaints other than as described in the HPI.     Objective:   Physical Exam Filed Vitals:   01/06/13 1601  BP: 138/70  Pulse: 67  Temp: 97.9 F (36.6 C)  Resp: 8   Wt Readings from Last 3 Encounters:  01/06/13 349 lb 12.8 oz (158.668 kg)  12/23/12 360 lb 1.6 oz (163.34 kg)  12/23/12 360 lb 1.6 oz (163.34 kg)   BP Readings from Last 3 Encounters:  01/06/13 138/70  12/25/12 122/66  12/25/12 122/66   Gen'l- obese white man in no distress HEENT C&S clear Neck - supple Cor 2+ radial pulse, RRR, no murmurs appreciated Pulm - normal respiration, no rales or wheezes Ext - 1-2 pulse LE edema Derm - very mild venous stasis. Neuro - grossly normal       Assessment & Plan:

## 2013-01-06 NOTE — Assessment & Plan Note (Signed)
Stable with no wheezing at today's exam.

## 2013-01-06 NOTE — Assessment & Plan Note (Signed)
Ruled out for acute syndrome: cath with non-obstructive disease; valvular disease stable by echo.  Plan Continue risk modification including weight loss: Diet management: smart food choices, PORTION SIZE CONTROL, regular exercise. Goal - to loose 1-2 lbs.month. Target weight - 300 lbs  Follow up with Dr. Stanford Breed as scheduled.

## 2013-01-07 ENCOUNTER — Other Ambulatory Visit: Payer: Medicare Other | Admitting: Cardiology

## 2013-01-08 ENCOUNTER — Encounter: Payer: Self-pay | Admitting: Cardiology

## 2013-01-08 ENCOUNTER — Ambulatory Visit (INDEPENDENT_AMBULATORY_CARE_PROVIDER_SITE_OTHER): Payer: Medicare Other | Admitting: Cardiology

## 2013-01-08 VITALS — BP 140/80 | HR 68 | Wt 349.0 lb

## 2013-01-08 DIAGNOSIS — I350 Nonrheumatic aortic (valve) stenosis: Secondary | ICD-10-CM

## 2013-01-08 DIAGNOSIS — I10 Essential (primary) hypertension: Secondary | ICD-10-CM

## 2013-01-08 DIAGNOSIS — I359 Nonrheumatic aortic valve disorder, unspecified: Secondary | ICD-10-CM

## 2013-01-08 DIAGNOSIS — I480 Paroxysmal atrial fibrillation: Secondary | ICD-10-CM

## 2013-01-08 DIAGNOSIS — I4891 Unspecified atrial fibrillation: Secondary | ICD-10-CM

## 2013-01-08 NOTE — Assessment & Plan Note (Signed)
Patient remains in sinus rhythm. Continue Toprol. Continue apixaban.

## 2013-01-08 NOTE — Assessment & Plan Note (Signed)
Patient's aortic stenosis is moderate on recent catheterization and echocardiogram. Plan repeat echo in one year.

## 2013-01-08 NOTE — Progress Notes (Signed)
HPI: Pleasant male for FU of paroxysmal atrial fibrillation, sleep apnea, and pedal edema. Admitted with CP June of 2014 and ruled out. Carotid Dopplers in June of 2014 showed no significant stenosis. Cardiac catheterization in June of 2014 showed a pulmonary artery pressure of 51/26 and pulmonary capillary wedge pressure of 20. Aortic valve area of 1.4 cm. There was a 20% circumflex, 30% right coronary artery. There was no disease in the LAD. Ejection fraction 55-65%. Medical therapy recommended. Echocardiogram performed in June 2014 showed moderate aortic stenosis with a mean gradient of 27 mm of mercury. There was mild left atrial enlargement and trace mitral regurgitation. There is no mention in the report of LV function. Since he was [name], he has had one episode of chest pain. He has dyspnea on exertion which is unchanged. No orthopnea, PND or syncope. Chronic pedal edema which is actually improved.   Current Outpatient Prescriptions  Medication Sig Dispense Refill  . albuterol (PROVENTIL HFA;VENTOLIN HFA) 108 (90 BASE) MCG/ACT inhaler Inhale 2 puffs into the lungs 4 (four) times daily as needed for wheezing or shortness of breath.      . allopurinol (ZYLOPRIM) 300 MG tablet Take 300 mg by mouth daily.      [name] apixaban (ELIQUIS) 5 MG TABS tablet Take 1 tablet (5 mg total) by mouth 2 (two) times daily.  60 tablet  3  . Ascorbic Acid (VITAMIN C PO) Take 1 tablet by mouth at bedtime.      . B Complex-C (B-COMPLEX WITH VITAMIN C) tablet Take 1 tablet by mouth at bedtime.       [name] ezetimibe (ZETIA) 10 MG tablet Take 10 mg by mouth daily.      . finasteride (PROSCAR) 5 MG tablet Take 1 tablet (5 mg total) by mouth daily.  30 tablet  5  . Fluticasone-Salmeterol (ADVAIR) 250-50 MCG/DOSE AEPB Inhale 1 puff into the lungs every 12 (twelve) hours.      . furosemide (LASIX) 40 MG tablet Take 1 tablet (40 mg total) by mouth 2 (two) times daily.  60 tablet  2  . HYDROcodone-acetaminophen (NORCO) 7.5-325 MG  per tablet take 1 tablet by mouth every 8 hours if needed for pain *MUST LAST 30 DAYS PER MD  60 tablet  0  . losartan (COZAAR) 100 MG tablet Take 1 tablet (100 mg total) by mouth daily.  30 tablet  3  . metoprolol (TOPROL-XL) 200 MG 24 hr tablet Take 200 mg by mouth 2 (two) times daily.      . Multiple Vitamin (MULTIVITAMIN WITH MINERALS) TABS Take 1 tablet by mouth daily.      . niacin (NIASPAN) 1000 MG CR tablet Take 1,000 mg by mouth 2 (two) times daily.      . Omega-3 Fatty Acids (FISH OIL) 1000 MG CAPS Take 2,000 mg by mouth 2 (two) times daily.      [name] omeprazole (PRILOSEC) 10 MG capsule Take 1 capsule (10 mg total) by mouth daily.  30 capsule  11  . oxyCODONE-acetaminophen (PERCOCET) 10-325 MG per tablet Take 1 tablet by mouth every 4 (four) hours as needed for pain.      . potassium chloride SA (K-DUR,KLOR-CON) 20 MEQ tablet Take 1 tablet (20 mEq total) by mouth daily.  30 tablet  3  . rosuvastatin (CRESTOR) 40 MG tablet Take 20 mg by mouth 2 (two) times daily.       . vitamin E 200 UNIT capsule Take 200 Units by mouth at bedtime.  No current facility-administered medications for this visit.     Past Medical History  Diagnosis Date  . HYPERCHOLESTEROLEMIA-PURE   . GOUT   . OBSTRUCTIVE SLEEP APNEA   . Essential hypertension, benign   . VENOUS INSUFFICIENCY   . ASTHMA   . COPD   . VENTRAL HERNIA   . OSTEOARTHRITIS   . CATARACT, HX OF   . PULMONARY HYPERTENSION, HX OF   . Hallux valgus (acquired)   . Obesity, unspecified   . Osteoarthrosis, unspecified whether generalized or localized, unspecified site   . Unspecified glaucoma(365.9)   . Moderate aortic stenosis     Per 12/2012 echo- (VTI 1.08 cm^2, Vmax 1.11 cm ^2), mild LA dilatation  . Paroxysmal atrial fibrillation 2003  . Lumbosacral neuritis   . Unspecified hereditary and idiopathic peripheral neuropathy   . Lumbosacral spondylosis   . CAD (coronary artery disease)     Nonobstructive by 12/2012 cath  . PUD  (peptic ulcer disease)   . BPH (benign prostatic hypertrophy)   . HH (hiatus hernia)   . Glaucoma   . Narcolepsy   . GERD (gastroesophageal reflux disease)   . Personal history of colonic adenoma 10/30/2012    Past Surgical History  Procedure Laterality Date  . Hemorrhoid surgery    . Vitrectomy    . Intraocular lens insertion Bilateral   . Leg surgery Bilateral   . Knee surgery Right   . Traeculectomy Left     eye  . Shoulder arthroscopy  08/30/2011    Procedure: ARTHROSCOPY SHOULDER;  Surgeon: [name], MD;  Location: [place];  Service: Orthopedics;  Laterality: Right;  Right Shoulder Arthroscopy, Debridement, and Decompression  . Tonsillectomy    . Colonoscopy    . Cholecystectomy    . Toe amputation    . Colonoscopy [name] 10/30/2012    Procedure: COLONOSCOPY;  Surgeon: [name], MD;  Location: [facility];  Service: Endoscopy;  Laterality: [name];  . Cardiac catheterization  1997    Non-obstructive disease  . Cardiac catheterization  12/24/2012    mild < 20% LCx, prox 30% RCA; LVEF 55-65% , moderate pulmonary HTN, moderate AS    History   Social History  . Marital Status: Married    Spouse Name: [name]    Number of Children: 0  . Years of Education: 20   Occupational History  . engineering     retired   Social History Main Topics  . Smoking status: Never Smoker   . Smokeless tobacco: Never Used  . Alcohol Use: No  . Drug Use: Yes    Special: Oxycodone  . Sexually Active: Not Currently   Other Topics Concern  . Not on file   Social History Narrative   HSG, [name] - BS, [name] - MS-engineering, 2 years on PhD - [employer]. Married - '65 - 31yr/divorced; '76- 3 yrs/divorced; '92 . No children. 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 ACP/Living Will - Yes CPR; long-term Mechanical ventilation as long as he was able to cognate; ok for long term artificial nutrition. Precondition being able to cognate and not to have too much  pain.     ROS: no fevers or chills, productive cough, hemoptysis, dysphasia, odynophagia, melena, hematochezia, dysuria, hematuria, rash, seizure activity, orthopnea, PND,  claudication. Remaining systems are negative.  Physical Exam: Well-developed obese in no acute distress.  Skin is warm and dry.  HEENT is normal.  Neck is supple.  Chest is clear to  auscultation with normal expansion.  Cardiovascular exam is regular rate and rhythm. 2/6 systolic murmur left sternal border Abdominal exam nontender or distended. No masses palpated. Extremities show 1+ edema. Chronic skin changes. neuro grossly intact

## 2013-01-08 NOTE — Assessment & Plan Note (Signed)
Blood pressure controlled. Continue present medications. 

## 2013-01-08 NOTE — Patient Instructions (Addendum)
Your physician wants you to follow-up in: 6 MONTHS WITH DR CRENSHAW You will receive a reminder letter in the mail two months in advance. If you don't receive a letter, please call our office to schedule the follow-up appointment.  

## 2013-02-01 ENCOUNTER — Other Ambulatory Visit: Payer: Self-pay | Admitting: Physical Medicine and Rehabilitation

## 2013-02-04 NOTE — Telephone Encounter (Signed)
Hydrocodone refilled per pharmacy electronic request.

## 2013-02-11 ENCOUNTER — Encounter: Payer: Medicare Other | Attending: Physical Medicine & Rehabilitation

## 2013-02-11 ENCOUNTER — Encounter: Payer: Self-pay | Admitting: Physical Medicine & Rehabilitation

## 2013-02-11 ENCOUNTER — Ambulatory Visit (HOSPITAL_BASED_OUTPATIENT_CLINIC_OR_DEPARTMENT_OTHER): Payer: Medicare Other | Admitting: Physical Medicine & Rehabilitation

## 2013-02-11 VITALS — BP 145/74 | HR 78 | Resp 16 | Ht 74.0 in | Wt 351.0 lb

## 2013-02-11 DIAGNOSIS — G4733 Obstructive sleep apnea (adult) (pediatric): Secondary | ICD-10-CM | POA: Insufficient documentation

## 2013-02-11 DIAGNOSIS — I10 Essential (primary) hypertension: Secondary | ICD-10-CM | POA: Insufficient documentation

## 2013-02-11 DIAGNOSIS — Z79899 Other long term (current) drug therapy: Secondary | ICD-10-CM | POA: Insufficient documentation

## 2013-02-11 DIAGNOSIS — J4489 Other specified chronic obstructive pulmonary disease: Secondary | ICD-10-CM | POA: Insufficient documentation

## 2013-02-11 DIAGNOSIS — G608 Other hereditary and idiopathic neuropathies: Secondary | ICD-10-CM

## 2013-02-11 DIAGNOSIS — I2789 Other specified pulmonary heart diseases: Secondary | ICD-10-CM | POA: Insufficient documentation

## 2013-02-11 DIAGNOSIS — M47817 Spondylosis without myelopathy or radiculopathy, lumbosacral region: Secondary | ICD-10-CM | POA: Insufficient documentation

## 2013-02-11 DIAGNOSIS — J449 Chronic obstructive pulmonary disease, unspecified: Secondary | ICD-10-CM | POA: Insufficient documentation

## 2013-02-11 DIAGNOSIS — E7800 Pure hypercholesterolemia, unspecified: Secondary | ICD-10-CM | POA: Insufficient documentation

## 2013-02-11 DIAGNOSIS — K219 Gastro-esophageal reflux disease without esophagitis: Secondary | ICD-10-CM | POA: Insufficient documentation

## 2013-02-11 DIAGNOSIS — G609 Hereditary and idiopathic neuropathy, unspecified: Secondary | ICD-10-CM | POA: Insufficient documentation

## 2013-02-11 DIAGNOSIS — I2510 Atherosclerotic heart disease of native coronary artery without angina pectoris: Secondary | ICD-10-CM | POA: Insufficient documentation

## 2013-02-11 NOTE — Progress Notes (Signed)
Subjective:    Patient ID: Tim [name], male    DOB: 07-28-1940, 72 y.o.   MRN: [id number]  HPI Hospitalized for Chest pain in May.  Normal catherteriszation.  ECHO showed moderate aortic stenosis. No plans for surgery at this time but will monitor. [place] a few weeks ago on wet tile in the kitchen, no injury. Using hydrocodone 15 mg at night on a regular basis. Has had a few occasions when he is used oxycodone at night. Still has a fairly full prescription from June of 2014. Burning pain in foot resolved. Now off of gabapentin. Has a muscular type pain in the left foot which is improving with time. Mainly in  the lateral side of the left foot.  Pain Inventory Average Pain 5 Pain Right Now 2 My pain is intermittent, dull and aching  In the last 24 hours, has pain interfered with the following? General activity 7 Relation with others 5 Enjoyment of life 5 What TIME of day is your pain at its worst? day,evening Sleep (in general) Good  Pain is worse with: walking, bending and standing Pain improves with: rest, pacing activities and medication Relief from Meds: 5  Mobility use a cane ability to climb steps?  no do you drive?  yes Do you have any goals in this area?  no  Function not employed: date last employed na  Neuro/Psych No problems in this area  Prior Studies Any changes since last visit?  no  Physicians involved in your care Any changes since last visit?  no   Family History  Problem Relation Age of Onset  . Hypertension Mother   . Coronary artery disease Mother   . Heart attack Mother   . Pulmonary fibrosis Father     asbestosis   History   Social History  . Marital Status: Married    Spouse Name: [name]    Number of Children: 0  . Years of Education: 20   Occupational History  . engineering     retired   Social History Main Topics  . Smoking status: Never Smoker   . Smokeless tobacco: Never Used  . Alcohol Use: No  . Drug Use: Yes     Special: Oxycodone  . Sexually Active: Not Currently   Other Topics Concern  . None   Social History Narrative   HSG, [name] - BS, [name] - MS-engineering, 2 years on PhD - [name]. Married - '65 - 88yr/divorced; '76- 3 yrs/divorced; '92 . No children. 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 ACP/Living Will - Yes CPR; long-term Mechanical ventilation as long as he was able to cognate; ok for long term artificial nutrition. Precondition being able to cognate and not to have too much pain.    Past Surgical History  Procedure Laterality Date  . Hemorrhoid surgery    . Vitrectomy    . Intraocular lens insertion Bilateral   . Leg surgery Bilateral   . Knee surgery Right   . Traeculectomy Left     eye  . Shoulder arthroscopy  08/30/2011    Procedure: ARTHROSCOPY SHOULDER;  Surgeon: [name] MD;  [place]  Service: Orthopedics;  Laterality: Right;  Right Shoulder Arthroscopy, Debridement, and Decompression  . Tonsillectomy    . Colonoscopy    . Cholecystectomy    . Toe amputation    . Colonoscopy [name] 10/30/2012    Procedure: COLONOSCOPY;  Surgeon: CGatha Mayer MD;  Location: [facility];  Service: Endoscopy;  Laterality: [name];  . Cardiac catheterization  1997    Non-obstructive disease  . Cardiac catheterization  12/24/2012    mild < 20% LCx, prox 30% RCA; LVEF 55-65% , moderate pulmonary HTN, moderate AS   Past Medical History  Diagnosis Date  . HYPERCHOLESTEROLEMIA-PURE   . GOUT   . OBSTRUCTIVE SLEEP APNEA   . Essential hypertension, benign   . VENOUS INSUFFICIENCY   . ASTHMA   . COPD   . VENTRAL HERNIA   . OSTEOARTHRITIS   . CATARACT, HX OF   . PULMONARY HYPERTENSION, HX OF   . Hallux valgus (acquired)   . Obesity, unspecified   . Osteoarthrosis, unspecified whether generalized or localized, unspecified site   . Unspecified glaucoma(365.9)   . Moderate aortic stenosis     Per 12/2012 echo- (VTI 1.08 cm^2, Vmax 1.11 cm  ^2), mild LA dilatation  . Paroxysmal atrial fibrillation 2003  . Lumbosacral neuritis   . Unspecified hereditary and idiopathic peripheral neuropathy   . Lumbosacral spondylosis   . CAD (coronary artery disease)     Nonobstructive by 12/2012 cath  . PUD (peptic ulcer disease)   . BPH (benign prostatic hypertrophy)   . HH (hiatus hernia)   . Glaucoma   . Narcolepsy   . GERD (gastroesophageal reflux disease)   . Personal history of colonic adenoma 10/30/2012   BP 145/74  Pulse 78  Resp 16  Ht 6' 2"  (1.88 m)  Wt 351 lb (159.213 kg)  BMI 45.05 kg/m2  SpO2 96%     Review of Systems  All other systems reviewed and are negative.       Objective:   Physical Exam  Nursing note and vitals reviewed. Constitutional: He is oriented to person, place, and time. He appears well-developed and well-nourished.  Neurological: He is alert and oriented to person, place, and time. He has normal strength. Gait abnormal.  Reflex Scores:      Patellar reflexes are 2+ on the right side and 2+ on the left side.      Achilles reflexes are 0 on the right side and 0 on the left side. Uses cane to ambulate Absent sensation to pinprick below the knees bilaterally. Proprioception absent at the toes present at the ankles Feet inspected. No evidence of skin lesions. Status post left great toe amputation well healed  Psychiatric: He has a normal mood and affect.          Assessment & Plan:  1. Severe peripheral neuropathy however has become less painful despite coming off of gabapentin. Still at high-risk for skin breakdown. Educated patient regarding daily skin inspection. He understands. 2. Lumbar spondylosis. Recommend activity. He does not tolerate a lot of walking but has been able to do stationary bicycling. He is also forgotten some of the exercises that my PA went over with them. He will go over these with her next visit. Continue current medications hydrocodone 50 mg each bedtime no signs  of average drug behavior. In addition he takes occasional oxycodone. 1 prescription of 30 tablets lasts more than 3 months

## 2013-02-19 ENCOUNTER — Other Ambulatory Visit: Payer: Self-pay | Admitting: Physical Medicine and Rehabilitation

## 2013-03-20 ENCOUNTER — Other Ambulatory Visit: Payer: Self-pay | Admitting: Physical Medicine and Rehabilitation

## 2013-03-20 ENCOUNTER — Other Ambulatory Visit: Payer: Self-pay | Admitting: Internal Medicine

## 2013-04-09 ENCOUNTER — Other Ambulatory Visit (HOSPITAL_COMMUNITY): Payer: Self-pay | Admitting: Orthopedic Surgery

## 2013-04-09 NOTE — Pre-Procedure Instructions (Signed)
Tim [name]  04/09/2013   Your procedure is scheduled on:  Friday, October 10th  Report to Main Entrance "A" at 1045 AM.  Call this number if you have problems the morning of surgery: [phone]   Remember:   Do not eat food or drink liquids after midnight.   Take these medicines the morning of surgery with A SIP OF WATER: toprol, advair, prilosec, pain medication if needed, albuterol if needed   Do not wear jewelry.  Do not wear lotions, powders, or perfumes. You may wear deodorant.  Do not shave 48 hours prior to surgery. Men may shave face and neck.  Do not bring valuables to the hospital.  [facility] is not responsible   for any belongings or valuables.               Contacts, dentures or bridgework may not be worn into surgery.  Leave suitcase in the car. After surgery it may be brought to your room.  For patients admitted to the hospital, discharge time is determined by your treatment team.               Patients discharged the day of surgery will not be allowed to drive home.    Special Instructions: Shower using CHG 2 nights before surgery and the night before surgery.  If you shower the day of surgery use CHG.  Use special wash - you have one bottle of CHG for all showers.  You should use approximately 1/3 of the bottle for each shower.   Please read over the following fact sheets that you were given: Pain Booklet, Coughing and Deep Breathing and Surgical Site Infection Prevention

## 2013-04-10 ENCOUNTER — Encounter (HOSPITAL_COMMUNITY): Payer: Self-pay | Admitting: Pharmacy Technician

## 2013-04-10 ENCOUNTER — Encounter (HOSPITAL_COMMUNITY): Payer: Self-pay

## 2013-04-10 ENCOUNTER — Encounter (HOSPITAL_COMMUNITY)
Admission: RE | Admit: 2013-04-10 | Discharge: 2013-04-10 | Disposition: A | Discharge status: Home / Self Care | Payer: Medicare Other | Source: Ambulatory Visit | Attending: Orthopedic Surgery | Admitting: Orthopedic Surgery

## 2013-04-10 HISTORY — DX: Shortness of breath: R06.02

## 2013-04-10 LAB — CBC
HCT: 37.7 % — ABNORMAL LOW (ref 39.0–52.0)
MCH: 31.7 pg (ref 26.0–34.0)
MCHC: 35 g/dL (ref 30.0–36.0)
MCV: 90.6 fL (ref 78.0–100.0)
RDW: 14.2 % (ref 11.5–15.5)

## 2013-04-10 LAB — COMPREHENSIVE METABOLIC PANEL
Albumin: 3.9 g/dL (ref 3.5–5.2)
Alkaline Phosphatase: 59 U/L (ref 39–117)
BUN: 22 mg/dL (ref 6–23)
Calcium: 9.5 mg/dL (ref 8.4–10.5)
Creatinine, Ser: 0.71 mg/dL (ref 0.50–1.35)
GFR calc Af Amer: >90 mL/min (ref >90)
Potassium: 4.6 mEq/L (ref 3.5–5.1)
Total Protein: 7.1 g/dL (ref 6.0–8.3)

## 2013-04-10 LAB — PROTIME-INR
INR: 1.07 (ref 0.00–1.49)
Prothrombin Time: 13.7 seconds (ref 11.6–15.2)

## 2013-04-10 LAB — APTT: aPTT: 30 seconds (ref 24–37)

## 2013-04-10 MED ORDER — DEXTROSE 5 % IV SOLN
3.0000 g | INTRAVENOUS | Status: AC
  Administered 2013-04-11: 3 g via INTRAVENOUS
  Filled 2013-04-10: qty 3000

## 2013-04-11 ENCOUNTER — Encounter (HOSPITAL_COMMUNITY): Payer: Medicare Other | Admitting: Anesthesiology

## 2013-04-11 ENCOUNTER — Encounter (HOSPITAL_COMMUNITY): Payer: Self-pay | Admitting: Anesthesiology

## 2013-04-11 ENCOUNTER — Encounter (HOSPITAL_COMMUNITY)
Admission: RE | Disposition: A | Discharge status: Home / Self Care | Payer: Self-pay | Source: Ambulatory Visit | Attending: Orthopedic Surgery

## 2013-04-11 ENCOUNTER — Ambulatory Visit (HOSPITAL_COMMUNITY)
Admission: RE | Admit: 2013-04-11 | Discharge: 2013-04-11 | Disposition: A | Discharge status: Home / Self Care | Payer: Medicare Other | Source: Ambulatory Visit | Attending: Orthopedic Surgery | Admitting: Orthopedic Surgery

## 2013-04-11 ENCOUNTER — Ambulatory Visit (HOSPITAL_COMMUNITY): Payer: Medicare Other | Admitting: Anesthesiology

## 2013-04-11 DIAGNOSIS — M869 Osteomyelitis, unspecified: Secondary | ICD-10-CM | POA: Insufficient documentation

## 2013-04-11 DIAGNOSIS — E1149 Type 2 diabetes mellitus with other diabetic neurological complication: Secondary | ICD-10-CM | POA: Insufficient documentation

## 2013-04-11 DIAGNOSIS — E1142 Type 2 diabetes mellitus with diabetic polyneuropathy: Secondary | ICD-10-CM | POA: Insufficient documentation

## 2013-04-11 DIAGNOSIS — E1169 Type 2 diabetes mellitus with other specified complication: Secondary | ICD-10-CM | POA: Insufficient documentation

## 2013-04-11 DIAGNOSIS — Z79899 Other long term (current) drug therapy: Secondary | ICD-10-CM | POA: Insufficient documentation

## 2013-04-11 DIAGNOSIS — Z01812 Encounter for preprocedural laboratory examination: Secondary | ICD-10-CM | POA: Insufficient documentation

## 2013-04-11 DIAGNOSIS — I10 Essential (primary) hypertension: Secondary | ICD-10-CM | POA: Insufficient documentation

## 2013-04-11 DIAGNOSIS — J4489 Other specified chronic obstructive pulmonary disease: Secondary | ICD-10-CM | POA: Insufficient documentation

## 2013-04-11 DIAGNOSIS — M86172 Other acute osteomyelitis, left ankle and foot: Secondary | ICD-10-CM

## 2013-04-11 DIAGNOSIS — M9080 Osteopathy in diseases classified elsewhere, unspecified site: Secondary | ICD-10-CM | POA: Insufficient documentation

## 2013-04-11 DIAGNOSIS — I798 Other disorders of arteries, arterioles and capillaries in diseases classified elsewhere: Secondary | ICD-10-CM | POA: Insufficient documentation

## 2013-04-11 DIAGNOSIS — J449 Chronic obstructive pulmonary disease, unspecified: Secondary | ICD-10-CM | POA: Insufficient documentation

## 2013-04-11 DIAGNOSIS — E1159 Type 2 diabetes mellitus with other circulatory complications: Secondary | ICD-10-CM | POA: Insufficient documentation

## 2013-04-11 HISTORY — PX: AMPUTATION: SHX166

## 2013-04-11 SURGERY — AMPUTATION DIGIT
Anesthesia: General | Site: Toe | Laterality: Left | Wound class: Dirty or Infected

## 2013-04-11 MED ORDER — ONDANSETRON HCL 4 MG/2ML IJ SOLN
INTRAMUSCULAR | Status: DC | PRN
  Administered 2013-04-11: 4 mg via INTRAMUSCULAR

## 2013-04-11 MED ORDER — 0.9 % SODIUM CHLORIDE (POUR BTL) OPTIME
TOPICAL | Status: DC | PRN
  Administered 2013-04-11: 1000 mL

## 2013-04-11 MED ORDER — ARTIFICIAL TEARS OP OINT
TOPICAL_OINTMENT | OPHTHALMIC | Status: DC | PRN
  Administered 2013-04-11: 1 via OPHTHALMIC

## 2013-04-11 MED ORDER — LACTATED RINGERS IV SOLN
INTRAVENOUS | Status: DC | PRN
  Administered 2013-04-11: 12:00:00 via INTRAVENOUS

## 2013-04-11 MED ORDER — EPHEDRINE SULFATE 50 MG/ML IJ SOLN
INTRAMUSCULAR | Status: DC | PRN
  Administered 2013-04-11 (×3): 10 mg via INTRAVENOUS

## 2013-04-11 MED ORDER — FENTANYL CITRATE 0.05 MG/ML IJ SOLN
INTRAMUSCULAR | Status: DC | PRN
  Administered 2013-04-11: 25 ug via INTRAVENOUS
  Administered 2013-04-11: 50 ug via INTRAVENOUS

## 2013-04-11 MED ORDER — LACTATED RINGERS IV SOLN
INTRAVENOUS | Status: DC
  Administered 2013-04-11: 12:00:00 via INTRAVENOUS

## 2013-04-11 MED ORDER — PROPOFOL 10 MG/ML IV BOLUS
INTRAVENOUS | Status: DC | PRN
  Administered 2013-04-11: 200 mg via INTRAVENOUS

## 2013-04-11 MED ORDER — LIDOCAINE HCL (CARDIAC) 20 MG/ML IV SOLN
INTRAVENOUS | Status: DC | PRN
  Administered 2013-04-11: 80 mg via INTRAVENOUS

## 2013-04-11 SURGICAL SUPPLY — 40 items
BANDAGE GAUZE ELAST BULKY 4 IN (GAUZE/BANDAGES/DRESSINGS) ×1 IMPLANT
BNDG CMPR 9X4 STRL LF SNTH (GAUZE/BANDAGES/DRESSINGS)
BNDG COHESIVE 4X5 TAN STRL (GAUZE/BANDAGES/DRESSINGS) ×1 IMPLANT
BNDG ESMARK 4X9 LF (GAUZE/BANDAGES/DRESSINGS) IMPLANT
CLOTH BEACON ORANGE TIMEOUT ST (SAFETY) ×1 IMPLANT
COVER SURGICAL LIGHT HANDLE (MISCELLANEOUS) ×2 IMPLANT
DRAPE U-SHAPE 47X51 STRL (DRAPES) ×2 IMPLANT
DRSG ADAPTIC 3X8 NADH LF (GAUZE/BANDAGES/DRESSINGS) IMPLANT
DRSG EMULSION OIL 3X3 NADH (GAUZE/BANDAGES/DRESSINGS) ×1 IMPLANT
DURAPREP 26ML APPLICATOR (WOUND CARE) ×2 IMPLANT
ELECT REM PT RETURN 9FT ADLT (ELECTROSURGICAL) ×2
ELECTRODE REM PT RTRN 9FT ADLT (ELECTROSURGICAL) ×1 IMPLANT
GLOVE BIO SURGEON STRL SZ 6.5 (GLOVE) ×1 IMPLANT
GLOVE BIOGEL PI IND STRL 6.5 (GLOVE) IMPLANT
GLOVE BIOGEL PI IND STRL 7.5 (GLOVE) IMPLANT
GLOVE BIOGEL PI IND STRL 9 (GLOVE) ×1 IMPLANT
GLOVE BIOGEL PI INDICATOR 6.5 (GLOVE) ×1
GLOVE BIOGEL PI INDICATOR 7.5 (GLOVE) ×1
GLOVE BIOGEL PI INDICATOR 9 (GLOVE) ×1
GLOVE ECLIPSE 6.5 STRL STRAW (GLOVE) ×1 IMPLANT
GLOVE SURG ORTHO 9.0 STRL STRW (GLOVE) ×2 IMPLANT
GOWN PREVENTION PLUS XLARGE (GOWN DISPOSABLE) ×3 IMPLANT
GOWN SRG XL XLNG 56XLVL 4 (GOWN DISPOSABLE) ×1 IMPLANT
GOWN STRL NON-REIN LRG LVL3 (GOWN DISPOSABLE) ×1 IMPLANT
GOWN STRL NON-REIN XL XLG LVL4 (GOWN DISPOSABLE)
KIT BASIN OR (CUSTOM PROCEDURE TRAY) ×2 IMPLANT
KIT ROOM TURNOVER OR (KITS) ×2 IMPLANT
MANIFOLD NEPTUNE II (INSTRUMENTS) ×1 IMPLANT
NEEDLE 22X1 1/2 (OR ONLY) (NEEDLE) IMPLANT
NS IRRIG 1000ML POUR BTL (IV SOLUTION) ×2 IMPLANT
PACK ORTHO EXTREMITY (CUSTOM PROCEDURE TRAY) ×2 IMPLANT
PAD ARMBOARD 7.5X6 YLW CONV (MISCELLANEOUS) ×4 IMPLANT
SPONGE GAUZE 4X4 12PLY (GAUZE/BANDAGES/DRESSINGS) ×1 IMPLANT
SUCTION FRAZIER TIP 10 FR DISP (SUCTIONS) IMPLANT
SUT ETHILON 2 0 PSLX (SUTURE) ×1 IMPLANT
SUT ETHILON 3 0 FSL (SUTURE) ×1 IMPLANT
SYR CONTROL 10ML LL (SYRINGE) IMPLANT
TOWEL OR 17X24 6PK STRL BLUE (TOWEL DISPOSABLE) ×2 IMPLANT
TOWEL OR 17X26 10 PK STRL BLUE (TOWEL DISPOSABLE) ×2 IMPLANT
TUBE CONNECTING 12X1/4 (SUCTIONS) IMPLANT

## 2013-04-11 NOTE — Progress Notes (Signed)
Orthopedic Tech Progress Note Patient Details:  Tim [name] 08/19/1940 [id number]  Ortho Devices Type of Ortho Device: Postop shoe/boot Ortho Device/Splint Location: LLE Ortho Device/Splint Interventions: Application;Ordered   [name] 04/11/2013, 2:10 PM

## 2013-04-11 NOTE — Discharge Summary (Signed)
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 Discharge to home in stable condition no prescriptions. Surgical procedure left foot third toe amputation at the MTP joint. Follow up office in one week.

## 2013-04-11 NOTE — Anesthesia Procedure Notes (Signed)
Procedure Name: LMA Insertion Date/Time: 04/11/2013 12:20 PM Performed by: Erik Obey Pre-anesthesia Checklist: Patient identified and Timeout performed Patient Re-evaluated:Patient Re-evaluated prior to inductionOxygen Delivery Method: Circle system utilized Preoxygenation: Pre-oxygenation with 100% oxygen Intubation Type: IV induction Ventilation: Mask ventilation without difficulty, Two handed mask ventilation required and Oral airway inserted - appropriate to patient size LMA: LMA with gastric port inserted LMA Size: 5.0 Number of attempts: 1 Placement Confirmation: positive ETCO2 and breath sounds checked- equal and bilateral Tube secured with: Tape Dental Injury: Teeth and Oropharynx as per pre-operative assessment

## 2013-04-11 NOTE — Anesthesia Preprocedure Evaluation (Signed)
Anesthesia Evaluation  Patient identified by MRN, date of birth, ID band Patient awake    Airway Mallampati: I  Neck ROM: Full    Dental  (+) Teeth Intact   Pulmonary shortness of breath, asthma , sleep apnea , COPD breath sounds clear to auscultation        Cardiovascular hypertension, + CAD and + Peripheral Vascular Disease + dysrhythmias + Valvular Problems/Murmurs AS Rhythm:Regular Rate:Normal  Mod AS by ECHO   Neuro/Psych    GI/Hepatic hiatal hernia, PUD, GERD-  ,  Endo/Other  Morbid obesity  Renal/GU      Musculoskeletal   Abdominal (+) + obese,   Peds  Hematology   Anesthesia Other Findings   Reproductive/Obstetrics                           Anesthesia Physical Anesthesia Plan  ASA: III  Anesthesia Plan: General   Post-op Pain Management:    Induction: Intravenous  Airway Management Planned: LMA  Additional Equipment:   Intra-op Plan:   Post-operative Plan: Extubation in OR  Informed Consent: I have reviewed the patients History and Physical, chart, labs and discussed the procedure including the risks, benefits and alternatives for the proposed anesthesia with the patient or authorized representative who has indicated his/her understanding and acceptance.   Dental advisory given  Plan Discussed with: CRNA and Surgeon  Anesthesia Plan Comments:         Anesthesia Quick Evaluation

## 2013-04-11 NOTE — Preoperative (Addendum)
Beta Blockers   Reason not to administer Beta Blockers:Not Applicable 

## 2013-04-11 NOTE — Op Note (Signed)
OPERATIVE REPORT  DATE OF SURGERY: 04/11/2013  PATIENT:  Tim [name],  72 y.o. 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  SURGEON:  Surgeon(s): [name], MD  ANESTHESIA:   general  EBL:  Minimal ML  SPECIMEN:  Source of Specimen:  Left foot third toe  TOURNIQUET:  * No tourniquets in log *  PROCEDURE DETAILS: Patient is a 72 year old gentleman with diabetic insensate neuropathy with osteomyelitis abscess ulceration left foot third toe he has failed conservative care and presents at this time for surgical intervention. Risks and benefits were discussed including nonhealing of the wound persistent infection need for additional surgery. Patient states he understands and wished to proceed at this time. Description of procedure patient was brought to the operating room and underwent a general anesthetic. After adequate levels of anesthesia were obtained patient's left lower extremity was prepped using DuraPrep and draped into a sterile field. A fishmouth incision was made distal to the MTP joint of the left third toe the toe was amputated through the MTP joint. Electrocautery was used for hemostasis the skin was closed using 2-0 nylon the wound was irrigated with normal saline. The wound was covered with Adaptic orthopedic sponges ABDs dressing Kerlix and Coban. Patient was extubated taken to the PACU in stable condition plan for discharge to home.  PLAN OF CARE: Discharge to home after PACU  PATIENT DISPOSITION:  PACU - hemodynamically stable.   [name], MD 04/11/2013 12:37 PM

## 2013-04-11 NOTE — H&P (Signed)
Tim [name] is an 72 y.o. male.   Chief Complaint: Osteomyelitis abscess left foot third toe HPI: Patient is a 72 year old gentleman with diabetes and peripheral vascular disease. He has had progressive ulceration which has failed conservative care with osteomyelitis of the third toe  Past Medical History  Diagnosis Date  . HYPERCHOLESTEROLEMIA-PURE   . GOUT   . OBSTRUCTIVE SLEEP APNEA   . Essential hypertension, benign   . VENOUS INSUFFICIENCY   . ASTHMA   . COPD   . VENTRAL HERNIA   . OSTEOARTHRITIS   . CATARACT, HX OF   . PULMONARY HYPERTENSION, HX OF   . Hallux valgus (acquired)   . Obesity, unspecified   . Osteoarthrosis, unspecified whether generalized or localized, unspecified site   . Unspecified glaucoma(365.9)   . Moderate aortic stenosis     Per 12/2012 echo- (VTI 1.08 cm^2, Vmax 1.11 cm ^2), mild LA dilatation  . Paroxysmal atrial fibrillation 2003  . Lumbosacral neuritis   . Unspecified hereditary and idiopathic peripheral neuropathy   . Lumbosacral spondylosis   . CAD (coronary artery disease)     Nonobstructive by 12/2012 cath  . PUD (peptic ulcer disease)   . BPH (benign prostatic hypertrophy)   . HH (hiatus hernia)   . Glaucoma   . GERD (gastroesophageal reflux disease)   . Personal history of colonic adenoma 10/30/2012  . Shortness of breath     with exertion    Past Surgical History  Procedure Laterality Date  . Hemorrhoid surgery    . Vitrectomy    . Intraocular lens insertion Bilateral   . Leg surgery Bilateral   . Knee surgery Right   . Traeculectomy Left     eye  . Shoulder arthroscopy  08/30/2011    Procedure: ARTHROSCOPY SHOULDER;  Surgeon: [name], MD;  Location: [place];  Service: Orthopedics;  Laterality: Right;  Right Shoulder Arthroscopy, Debridement, and Decompression  . Tonsillectomy    . Colonoscopy    . Cholecystectomy    . Toe amputation    . Colonoscopy [name] 10/30/2012    Procedure: COLONOSCOPY;  Surgeon: [name],  MD;  Location: [facility];  Service: Endoscopy;  Laterality: [name];  . Cardiac catheterization  1997    Non-obstructive disease  . Cardiac catheterization  12/24/2012    mild < 20% LCx, prox 30% RCA; LVEF 55-65% , moderate pulmonary HTN, moderate AS  . Eye surgery Bilateral cataract    Family History  Problem Relation Age of Onset  . Hypertension Mother   . Coronary artery disease Mother   . Heart attack Mother   . Pulmonary fibrosis Father     asbestosis   Social History:  reports that he has never smoked. He has never used smokeless tobacco. He reports that he uses illicit drugs (Oxycodone). He reports that he does not drink alcohol.  Allergies:  Allergies  Allergen Reactions  . Brimonidine Tartrate     ALPHAGAN-Shortness of breath  . Brinzolamide     AZOPT- Shortness of breath  . Celecoxib     CELLBREX-confusion  . Codeine     Childhood reaction  . Colchicine     diarrhea  . Diltiazem      leg swelling  . Latanoprost     XALATAN- Shortness of breath  . Rofecoxib      VIOXX- leg swelling  . Timolol Maleate     TIMOPTIC- Aggravated asthma  . Vancomycin     hives/blisters    No  prescriptions prior to admission    Results for orders placed during the hospital encounter of 04/10/13 (from the past 48 hour(s))  APTT     Status: None   Collection Time    04/10/13 10:56 AM      Result Value Range   aPTT 30  24 - 37 seconds  CBC     Status: Abnormal   Collection Time    04/10/13 10:56 AM      Result Value Range   WBC 6.9  4.0 - 10.5 K/uL   RBC 4.16 (*) 4.22 - 5.81 MIL/uL   Hemoglobin 13.2  13.0 - 17.0 g/dL   HCT 37.7 (*) 39.0 - 52.0 %   MCV 90.6  78.0 - 100.0 fL   MCH 31.7  26.0 - 34.0 pg   MCHC 35.0  30.0 - 36.0 g/dL   RDW 14.2  11.5 - 15.5 %   Platelets 175  150 - 400 K/uL  COMPREHENSIVE METABOLIC PANEL     Status: Abnormal   Collection Time    04/10/13 10:56 AM      Result Value Range   Sodium 140  135 - 145 mEq/L   Potassium 4.6  3.5 - 5.1 mEq/L    Chloride 101  96 - 112 mEq/L   CO2 29  19 - 32 mEq/L   Glucose, Bld 124 (*) 70 - 99 mg/dL   BUN 22  6 - 23 mg/dL   Creatinine, Ser 0.71  0.50 - 1.35 mg/dL   Calcium 9.5  8.4 - 10.5 mg/dL   Total Protein 7.1  6.0 - 8.3 g/dL   Albumin 3.9  3.5 - 5.2 g/dL   AST 17  0 - 37 U/L   ALT 15  0 - 53 U/L   Alkaline Phosphatase 59  39 - 117 U/L   Total Bilirubin 0.6  0.3 - 1.2 mg/dL   GFR calc non Af Amer >90  >90 mL/min   GFR calc Af Amer >90  >90 mL/min   Comment: (NOTE)     The eGFR has been calculated using the CKD EPI equation.     This calculation has not been validated in all clinical situations.     eGFR's persistently <90 mL/min signify possible Chronic Kidney     Disease.  PROTIME-INR     Status: None   Collection Time    04/10/13 10:56 AM      Result Value Range   Prothrombin Time 13.7  11.6 - 15.2 seconds   INR 1.07  0.00 - 1.49   No results found.  Review of Systems  All other systems reviewed and are negative.    There were no vitals taken for this visit. Physical Exam    Examination patient has a palpable pulse. There is an ulcer which probes to bone with sausage digit swelling of the third toe radiographs confirm osteomyelitis. Assessment/Plan Assessment: Osteomyelitis left foot third toe.  Plan: We'll plan for third toe amputation. Risks and benefits were discussed including nonhealing and risk of infection. Patient states he understands and wished to proceed at this time.  Adele Milson V 04/11/2013, 6:27 AM

## 2013-04-11 NOTE — Transfer of Care (Signed)
Immediate Anesthesia Transfer of Care Note  Patient: Tim [name]  Procedure(s) Performed: Procedure(s) with comments: AMPUTATION DIGIT Left 3rd toe (Left) - Left 3rd toe amputation at MTP  Patient Location: PACU  Anesthesia Type:General  Level of Consciousness: awake, alert  and oriented  Airway & Oxygen Therapy: Patient Spontanous Breathing and Patient connected to nasal cannula oxygen  Post-op Assessment: Report given to PACU RN, Post -op Vital signs reviewed and stable and Patient moving all extremities X 4  Post vital signs: Reviewed and stable  Complications: No apparent anesthesia complications

## 2013-04-11 NOTE — Anesthesia Postprocedure Evaluation (Signed)
  Anesthesia Post-op Note  Patient: Tim [name]  Procedure(s) Performed: Procedure(s) with comments: AMPUTATION DIGIT Left 3rd toe (Left) - Left 3rd toe amputation at [facility]TP  Patient Location: PACU  Anesthesia Type:General  Level of Consciousness: awake  Airway and Oxygen Therapy: Patient Spontanous Breathing  Post-op Pain: none  Post-op Assessment: Post-op Vital signs reviewed  Post-op Vital Signs: stable  Complications: No apparent anesthesia complications

## 2013-04-14 ENCOUNTER — Other Ambulatory Visit: Payer: Self-pay | Admitting: Physical Medicine and Rehabilitation

## 2013-04-14 ENCOUNTER — Other Ambulatory Visit: Payer: Self-pay | Admitting: Internal Medicine

## 2013-04-15 ENCOUNTER — Other Ambulatory Visit: Payer: Self-pay

## 2013-04-15 ENCOUNTER — Encounter (HOSPITAL_COMMUNITY): Payer: Self-pay | Admitting: Orthopedic Surgery

## 2013-04-15 MED ORDER — TRAMADOL-ACETAMINOPHEN 37.5-325 MG PO TABS: ORAL_TABLET | ORAL | Status: DC

## 2013-04-28 ENCOUNTER — Other Ambulatory Visit: Payer: Self-pay | Admitting: Internal Medicine

## 2013-04-28 ENCOUNTER — Telehealth: Payer: Self-pay

## 2013-04-28 ENCOUNTER — Other Ambulatory Visit: Payer: Self-pay | Admitting: Physical Medicine and Rehabilitation

## 2013-04-28 NOTE — Telephone Encounter (Signed)
Pharmacy sent over a Refill request for Gabapentin. Last office note states that patient no longer takes Gabapentin. Denied refill and sent a note to pharmacy if patient needed medication again to make appt to be seen.

## 2013-05-05 ENCOUNTER — Other Ambulatory Visit: Payer: Self-pay

## 2013-05-05 MED ORDER — APIXABAN 5 MG PO TABS: 5.0000 mg | ORAL_TABLET | Freq: Two times a day (BID) | ORAL | Status: DC

## 2013-05-05 MED ORDER — POTASSIUM CHLORIDE CRYS ER 20 MEQ PO TBCR: 20.0000 meq | EXTENDED_RELEASE_TABLET | Freq: Every day | ORAL | Status: DC

## 2013-05-05 MED ORDER — LOSARTAN POTASSIUM 100 MG PO TABS: 50.0000 mg | ORAL_TABLET | Freq: Two times a day (BID) | ORAL | Status: DC

## 2013-05-06 ENCOUNTER — Encounter: Payer: Self-pay | Admitting: Physical Medicine and Rehabilitation

## 2013-05-06 ENCOUNTER — Encounter
Payer: Medicare Other | Attending: Physical Medicine and Rehabilitation | Admitting: Physical Medicine and Rehabilitation

## 2013-05-06 VITALS — BP 136/65 | HR 68 | Resp 16 | Ht 74.0 in | Wt 352.0 lb

## 2013-05-06 DIAGNOSIS — M545 Low back pain, unspecified: Secondary | ICD-10-CM

## 2013-05-06 DIAGNOSIS — M5136 Other intervertebral disc degeneration, lumbar region: Secondary | ICD-10-CM

## 2013-05-06 DIAGNOSIS — Z5181 Encounter for therapeutic drug level monitoring: Secondary | ICD-10-CM

## 2013-05-06 DIAGNOSIS — M79609 Pain in unspecified limb: Secondary | ICD-10-CM | POA: Insufficient documentation

## 2013-05-06 DIAGNOSIS — M51379 Other intervertebral disc degeneration, lumbosacral region without mention of lumbar back pain or lower extremity pain: Secondary | ICD-10-CM | POA: Insufficient documentation

## 2013-05-06 DIAGNOSIS — Z79899 Other long term (current) drug therapy: Secondary | ICD-10-CM

## 2013-05-06 DIAGNOSIS — G8929 Other chronic pain: Secondary | ICD-10-CM

## 2013-05-06 DIAGNOSIS — M5137 Other intervertebral disc degeneration, lumbosacral region: Secondary | ICD-10-CM

## 2013-05-06 MED ORDER — OXYCODONE-ACETAMINOPHEN 10-325 MG PO TABS: 1.0000 | ORAL_TABLET | Freq: Three times a day (TID) | ORAL | Status: DC | PRN

## 2013-05-06 MED ORDER — HYDROCODONE-ACETAMINOPHEN 7.5-325 MG PO TABS: 1.0000 | ORAL_TABLET | Freq: Two times a day (BID) | ORAL | Status: DC | PRN

## 2013-05-06 NOTE — Patient Instructions (Signed)
Continue with staying as active as tolerated

## 2013-05-06 NOTE — Progress Notes (Signed)
Subjective:    Patient ID: Tim [name], male    DOB: 02/27/1941, 72 y.o.   MRN: [id number]  HPI The patient is a 72 year old  male, who presents with lateral left foot pain . The symptoms started a week ago, after his 3rd toe on the left was amputated 04/11/13. The patient complains about moderate to severe pain . Patient denies any radiating symptoms.Taking medications , changing positions alleviate the symptoms. Prolonged walking aggrevates the symptoms. The patient grades his pain as a  8/10. Mr. Fulford  has a history of a hereditary peripheral neuropathy as well as lumbar spondylosis. He has responded x1 to S1 transforaminal injection, however, subsequent repeats were not successful.   He states, that he is doing much better concerning his PNP, also his back pain has been stable.  Pain Inventory Average Pain 2 Pain Right Now 8 My pain is intermittent, sharp, dull and stabbing  In the last 24 hours, has pain interfered with the following? General activity 7 Relation with others 7 Enjoyment of life 6 What TIME of day is your pain at its worst? evening Sleep (in general) Poor  Pain is worse with: walking, bending and standing Pain improves with: rest, pacing activities and medication Relief from Meds: 8  Mobility use a cane how many minutes can you walk? 5 ability to climb steps?  no do you drive?  yes  Function retired I need assistance with the following:  household duties and shopping  Neuro/Psych weakness numbness tingling trouble walking  Prior Studies x-rays  Physicians involved in your care Any changes since last visit?  no   Family History  Problem Relation Age of Onset  . Hypertension Mother   . Coronary artery disease Mother   . Heart attack Mother   . Pulmonary fibrosis Father     asbestosis   History   Social History  . Marital Status: Married    Spouse Name: [name]    Number of Children: 0  . Years of Education: 20   Occupational History   . engineering     retired   Social History Main Topics  . Smoking status: Never Smoker   . Smokeless tobacco: Never Used  . Alcohol Use: No  . Drug Use: Yes    Special: Oxycodone  . Sexual Activity: Not Currently   Other Topics Concern  . None   Social History Narrative   HSG, [name] - BS, [name] - MS-engineering, 2 years on PhD - [name]. Married - '65 - 69yr/divorced; '76- 3 yrs/divorced; '92 . No children. 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 ACP/Living Will - Yes CPR; long-term Mechanical ventilation as long as he was able to cognate; ok for long term artificial nutrition. Precondition being able to cognate and not to have too much pain.    Past Surgical History  Procedure Laterality Date  . Hemorrhoid surgery    . Vitrectomy    . Intraocular lens insertion Bilateral   . Leg surgery Bilateral   . Knee surgery Right   . Traeculectomy Left     eye  . Shoulder arthroscopy  08/30/2011    Procedure: ARTHROSCOPY SHOULDER;  Surgeon: [name] MD;  Location[place] [facility]  Service: Orthopedics;  Laterality: Right;  Right Shoulder Arthroscopy, Debridement, and Decompression  . Tonsillectomy    . Colonoscopy    . Cholecystectomy    . Toe amputation    . Colonoscopy [name] 10/30/2012  Procedure: COLONOSCOPY;  Surgeon: [name], MD;  Location: [facility];  Service: Endoscopy;  Laterality: [name];  . Cardiac catheterization  1997    Non-obstructive disease  . Cardiac catheterization  12/24/2012    mild < 20% LCx, prox 30% RCA; LVEF 55-65% , moderate pulmonary HTN, moderate AS  . Eye surgery Bilateral cataract  . Amputation Left 04/11/2013    Procedure: AMPUTATION DIGIT Left 3rd toe;  Surgeon: [name], MD;  Location: [place] ;  Service: Orthopedics;  Laterality: Left;  Left 3rd toe amputation at [facility]   Past Medical History  Diagnosis Date  . HYPERCHOLESTEROLEMIA-PURE   . GOUT   . OBSTRUCTIVE SLEEP APNEA   . Essential hypertension,  benign   . VENOUS INSUFFICIENCY   . ASTHMA   . COPD   . VENTRAL HERNIA   . OSTEOARTHRITIS   . CATARACT, HX OF   . PULMONARY HYPERTENSION, HX OF   . Hallux valgus (acquired)   . Obesity, unspecified   . Osteoarthrosis, unspecified whether generalized or localized, unspecified site   . Unspecified glaucoma(365.9)   . Moderate aortic stenosis     Per 12/2012 echo- (VTI 1.08 cm^2, Vmax 1.11 cm ^2), mild LA dilatation  . Paroxysmal atrial fibrillation 2003  . Lumbosacral neuritis   . Unspecified hereditary and idiopathic peripheral neuropathy   . Lumbosacral spondylosis   . CAD (coronary artery disease)     Nonobstructive by 12/2012 cath  . PUD (peptic ulcer disease)   . BPH (benign prostatic hypertrophy)   . HH (hiatus hernia)   . Glaucoma   . GERD (gastroesophageal reflux disease)   . Personal history of colonic adenoma 10/30/2012  . Shortness of breath     with exertion   BP 136/65  Pulse 68  Resp 16  Ht 6' 2"  (1.88 m)  Wt 352 lb (159.666 kg)  BMI 45.17 kg/m2  SpO2 97%     Review of Systems  Constitutional: Positive for fever and chills.  Respiratory: Positive for apnea and shortness of breath.   Gastrointestinal: Positive for constipation.  Musculoskeletal: Positive for back pain and gait problem.  Neurological: Positive for weakness and numbness.  All other systems reviewed and are negative.       Objective:   Physical Exam Constitutional: He is oriented to person, place, and time. He appears well-developed.  Obese walks with a cane  HENT:  Head: Normocephalic.  Neck: Neck supple.  Musculoskeletal: He exhibits tenderness.  Neurological: He is alert and oriented to person, place, and time.  Skin: Skin is warm and dry.  Psychiatric: He has a normal mood and affect.  Symmetric normal motor tone is noted throughout. Normal muscle bulk. Muscle testing reveals 5/5 muscle strength of the upper extremity, and 5/5 of the lower extremity. Full range of motion in  upper and lower extremities. ROM of spine is restricted.  DTR in the upper and lower extremity are present and symmetric 2+. No clonus is noted.  Patient arises from chair with difficulty. Wide based gait with a cane.         Assessment & Plan:  This is a 72 year old male with  1.DDD L-spine , showed patient some exercises to strengthen his core muscles without aggrevating his back. 2.Low back pain  3 s/p Right shoulder surgery , Nov. 2012  4. PNP, showed patient some exercises to improve balance.  5. Left foot pain , on top of his 4th and 5th metatarsale, patient walks slightly supinated,  he does not really roll over his for foot , his 1st toe also has been amputated in the past. The pain he is complaining about most likely is muscle skeletal pain from his walking technic . Recommended  Applying Arnika cream on the painful area, advised him not to apply this cream to any open wound !  Plan :  Advised patient to strengthen his lower extremity muscles by riding the stationary bike, and also to walk as tolerated.  Refilled his Oxycodone 22m, # 30 , for severe breakthrough pain, which he uses very sparingly. Refilled his Hydrocodone 10/325 bid, # 60 Patient is back on his Gabapentin 4047mtid, he tried to taper it down, but then his foot pain was to severe.  Follow up in 1 month.

## 2013-05-08 ENCOUNTER — Other Ambulatory Visit: Payer: Self-pay

## 2013-05-23 ENCOUNTER — Other Ambulatory Visit: Payer: Self-pay | Admitting: Internal Medicine

## 2013-06-03 ENCOUNTER — Encounter: Payer: Self-pay | Admitting: Physical Medicine & Rehabilitation

## 2013-06-03 ENCOUNTER — Encounter: Payer: Medicare Other | Attending: Physical Medicine & Rehabilitation

## 2013-06-03 ENCOUNTER — Ambulatory Visit (HOSPITAL_BASED_OUTPATIENT_CLINIC_OR_DEPARTMENT_OTHER): Payer: Medicare Other | Admitting: Physical Medicine & Rehabilitation

## 2013-06-03 VITALS — BP 144/71 | HR 81 | Resp 16 | Ht 74.0 in | Wt 349.0 lb

## 2013-06-03 DIAGNOSIS — M5137 Other intervertebral disc degeneration, lumbosacral region: Secondary | ICD-10-CM | POA: Insufficient documentation

## 2013-06-03 DIAGNOSIS — I70269 Atherosclerosis of native arteries of extremities with gangrene, unspecified extremity: Secondary | ICD-10-CM

## 2013-06-03 DIAGNOSIS — M51379 Other intervertebral disc degeneration, lumbosacral region without mention of lumbar back pain or lower extremity pain: Secondary | ICD-10-CM | POA: Insufficient documentation

## 2013-06-03 DIAGNOSIS — M79609 Pain in unspecified limb: Secondary | ICD-10-CM | POA: Insufficient documentation

## 2013-06-03 DIAGNOSIS — G608 Other hereditary and idiopathic neuropathies: Secondary | ICD-10-CM

## 2013-06-03 MED ORDER — OXYCODONE-ACETAMINOPHEN 10-325 MG PO TABS: 1.0000 | ORAL_TABLET | Freq: Three times a day (TID) | ORAL | Status: DC | PRN

## 2013-06-03 MED ORDER — GABAPENTIN 600 MG PO TABS: 600.0000 mg | ORAL_TABLET | Freq: Three times a day (TID) | ORAL | Status: DC

## 2013-06-03 NOTE — Progress Notes (Signed)
Subjective:    Patient ID: Tim [name], male    DOB: [birth year], 72 y.o.   MRN: [id number]  HPI Left 3rd toe amp,~80month ago Left 4th toe Has dorsal foot pain Has plantar foot pain Has severe intermittent pain Has not seen surgeon since stiches removed Pain Inventory Average Pain 6 Pain Right Now 2 My pain is intermittent, burning, dull and aching  In the last 24 hours, has pain interfered with the following? General activity 7 Relation with others 8 Enjoyment of life 8 What TIME of day is your pain at its worst? daytime Sleep (in general) Fair  Pain is worse with: walking, bending and standing Pain improves with: rest and medication Relief from Meds: n/a  Mobility use a cane how many minutes can you walk? 5 ability to climb steps?  no do you drive?  yes Do you have any goals in this area?  no  Function retired I need assistance with the following:  meal prep, household duties and shopping Do you have any goals in this area?  no  Neuro/Psych trouble walking  Prior Studies Any changes since last visit?  no  Physicians involved in your care Norins, DMammie Russian  Family History  Problem Relation Age of Onset  . Hypertension Mother   . Coronary artery disease Mother   . Heart attack Mother   . Pulmonary fibrosis Father     asbestosis   History   Social History  . Marital Status: Married    Spouse Name: [name]    Number of Children: 0  . Years of Education: 20   Occupational History  . engineering     retired   Social History Main Topics  . Smoking status: Never Smoker   . Smokeless tobacco: Never Used  . Alcohol Use: No  . Drug Use: Yes    Special: Oxycodone  . Sexual Activity: Not Currently   Other Topics Concern  . None   Social History Narrative   HSG, [name] - BS, [name] - MS-engineering, 2 years on PhD - [employer] Married - '65 - 841yrdivorced; '76- 3 yrs/divorced; '92 . No children. 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 ACP/Living Will - Yes CPR; long-term Mechanical ventilation as long as he was able to cognate; ok for long term artificial nutrition. Precondition being able to cognate and not to have too much pain.    Past Surgical History  Procedure Laterality Date  . Hemorrhoid surgery    . Vitrectomy    . Intraocular lens insertion Bilateral   . Leg surgery Bilateral   . Knee surgery Right   . Traeculectomy Left     eye  . Shoulder arthroscopy  08/30/2011    Procedure: ARTHROSCOPY SHOULDER;  Surgeon: MaNewt MinionMD;  [place] [facility] Service: [facility]opedics;  Laterality: Right;  Right Shoulder Arthroscopy, Debridement, and Decompression  . Tonsillectomy    . Colonoscopy    . Cholecystectomy    . Toe amputation    . Colonoscopy [name] 10/30/2012    Procedure: COLONOSCOPY;  Surgeon: CaGatha MayerMD;  Location: [facility];  Service: Endoscopy;  Laterality: [name];  . Cardiac catheterization  1997    Non-obstructive disease  . Cardiac catheterization  12/24/2012    mild < 20% LCx, prox 30% RCA; LVEF 55-65% , moderate pulmonary HTN, moderate AS  . Eye surgery Bilateral cataract  . Amputation Left 04/11/2013    Procedure: AMPUTATION DIGIT Left 3rd  toe;  Surgeon: [name], MD;  Location: [place];  Service: Orthopedics;  Laterality: Left;  Left 3rd toe amputation at [facility]TP   Past Medical History  Diagnosis Date  . HYPERCHOLESTEROLEMIA-PURE   . GOUT   . OBSTRUCTIVE SLEEP APNEA   . Essential hypertension, benign   . VENOUS INSUFFICIENCY   . ASTHMA   . COPD   . VENTRAL HERNIA   . OSTEOARTHRITIS   . CATARACT, HX OF   . PULMONARY HYPERTENSION, HX OF   . Hallux valgus (acquired)   . Obesity, unspecified   . Osteoarthrosis, unspecified whether generalized or localized, unspecified site   . Unspecified glaucoma(365.9)   . Moderate aortic stenosis     Per 12/2012 echo- (VTI 1.08 cm^2, Vmax 1.11 cm ^2), mild LA dilatation  . Paroxysmal atrial fibrillation 2003  .  Lumbosacral neuritis   . Unspecified hereditary and idiopathic peripheral neuropathy   . Lumbosacral spondylosis   . CAD (coronary artery disease)     Nonobstructive by 12/2012 cath  . PUD (peptic ulcer disease)   . BPH (benign prostatic hypertrophy)   . HH (hiatus hernia)   . Glaucoma   . GERD (gastroesophageal reflux disease)   . Personal history of colonic adenoma 10/30/2012  . Shortness of breath     with exertion   BP 144/71  Pulse 81  Resp 16  Ht 6' 2"  (1.88 m)  Wt 349 lb (158.305 kg)  BMI 44.79 kg/m2  SpO2 97%     Review of Systems  Respiratory: Positive for apnea and shortness of breath.   Cardiovascular: Positive for leg swelling.  Gastrointestinal: Positive for diarrhea and constipation.  Musculoskeletal: Positive for back pain and gait problem.  All other systems reviewed and are negative.       Objective:   Physical Exam Left fourth toe nail black and and a hard black and area at the distal tip of the left fourth toe. Nontender to palpation  Decreased sensation in the remaining toes of the left foot. Decreased sensation on the lateral border of the left foot. No skin area or skin lesions noted on the lateral foot or on the dorsum or plantar surface of the foot.       Assessment & Plan:  1.  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

## 2013-06-03 NOTE — Patient Instructions (Signed)
Follow up with orthopedic surgeon regarding left fourth toe

## 2013-06-05 ENCOUNTER — Ambulatory Visit: Payer: Medicare Other | Admitting: Physical Medicine and Rehabilitation

## 2013-06-06 ENCOUNTER — Other Ambulatory Visit: Payer: Self-pay | Admitting: Internal Medicine

## 2013-06-09 ENCOUNTER — Telehealth: Payer: Self-pay

## 2013-06-09 NOTE — Telephone Encounter (Signed)
Patient is concerned about his gabapentin.  His rx says qid but he thought it was only tid.  Please advise.  Ok to leave message

## 2013-06-09 NOTE — Telephone Encounter (Signed)
Left patient a voicemail with new instructions for taking Gabapentin per Dr. Letta Pate.

## 2013-06-09 NOTE — Telephone Encounter (Signed)
Start with 600 mg per os 3 times a day for 1 week and if pain still not under control may increase to 4 times a day

## 2013-06-25 ENCOUNTER — Other Ambulatory Visit: Payer: Self-pay | Admitting: Internal Medicine

## 2013-07-08 ENCOUNTER — Encounter: Payer: Self-pay | Admitting: Physical Medicine & Rehabilitation

## 2013-07-08 ENCOUNTER — Encounter: Payer: Medicare Other | Attending: Physical Medicine & Rehabilitation

## 2013-07-08 ENCOUNTER — Ambulatory Visit (HOSPITAL_BASED_OUTPATIENT_CLINIC_OR_DEPARTMENT_OTHER): Payer: Medicare Other | Admitting: Physical Medicine & Rehabilitation

## 2013-07-08 VITALS — BP 123/60 | HR 75 | Resp 14 | Ht 74.0 in | Wt 354.0 lb

## 2013-07-08 DIAGNOSIS — M5137 Other intervertebral disc degeneration, lumbosacral region: Secondary | ICD-10-CM | POA: Insufficient documentation

## 2013-07-08 DIAGNOSIS — G608 Other hereditary and idiopathic neuropathies: Secondary | ICD-10-CM

## 2013-07-08 DIAGNOSIS — M51379 Other intervertebral disc degeneration, lumbosacral region without mention of lumbar back pain or lower extremity pain: Secondary | ICD-10-CM | POA: Insufficient documentation

## 2013-07-08 DIAGNOSIS — M79609 Pain in unspecified limb: Secondary | ICD-10-CM | POA: Insufficient documentation

## 2013-07-08 MED ORDER — OXYCODONE-ACETAMINOPHEN 10-325 MG PO TABS: 1.0000 | ORAL_TABLET | Freq: Three times a day (TID) | ORAL | Status: DC | PRN

## 2013-07-08 NOTE — Patient Instructions (Signed)
Referral to physical therapy for balance program  Continue oxycodone 3 times a day Continue gabapentin 600 mg 3 times per day Follow up with orthopedics in regards to foot lesion

## 2013-07-08 NOTE — Progress Notes (Signed)
Subjective:    Patient ID: Tim [name], male    DOB: September 22, 1940, 73 y.o.   MRN: [id number]  HPI Left 3rd toe amp,~55month ago  Left 4th toe  Has dorsal foot pain  Has plantar foot pain  Has severe intermittent pain  Has not seen surgeon since stiches removed  Pain Inventory Average Pain 7 Pain Right Now 2 My pain is intermittent, sharp, dull and stabbing  In the last 24 hours, has pain interfered with the following? General activity 9 Relation with others 8 Enjoyment of life 8 What TIME of day is your pain at its worst? evening Sleep (in general) Fair  Pain is worse with: walking, bending and standing Pain improves with: rest, pacing activities, medication and injections Relief from Meds: 7  Mobility use a cane how many minutes can you walk? 5 ability to climb steps?  yes do you drive?  yes  Function retired I need assistance with the following:  meal prep, household duties and shopping  Neuro/Psych bladder control problems weakness numbness tingling trouble walking  Prior Studies Any changes since last visit?  no  Physicians involved in your care Any changes since last visit?  no   Family History  Problem Relation Age of Onset  . Hypertension Mother   . Coronary artery disease Mother   . Heart attack Mother   . Pulmonary fibrosis Father     asbestosis   History   Social History  . Marital Status: Married    Spouse Name: [name]    Number of Children: 0  . Years of Education: 20   Occupational History  . engineering     retired   Social History Main Topics  . Smoking status: Never Smoker   . Smokeless tobacco: Never Used  . Alcohol Use: No  . Drug Use: Yes    Special: Oxycodone  . Sexual Activity: Not Currently   Other Topics Concern  . None   Social History Narrative   HSG, [name] - BS, [name] - MS-engineering, 2 years on PhD - [employer] Married - '65 - 836yrdivorced; '76- 3 yrs/divorced; '92 . No children.  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 ACP/Living Will - Yes CPR; long-term Mechanical ventilation as long as he was able to cognate; ok for long term artificial nutrition. Precondition being able to cognate and not to have too much pain.    Past Surgical History  Procedure Laterality Date  . Hemorrhoid surgery    . Vitrectomy    . Intraocular lens insertion Bilateral   . Leg surgery Bilateral   . Knee surgery Right   . Traeculectomy Left     eye  . Shoulder arthroscopy  08/30/2011    Procedure: ARTHROSCOPY SHOULDER;  Surgeon: MaNewt MinionMD;  [place] [facility] Service: [place]ics;  Laterality: Right;  Right Shoulder Arthroscopy, Debridement, and Decompression  . Tonsillectomy    . Colonoscopy    . Cholecystectomy    . Toe amputation    . Colonoscopy [name] 10/30/2012    Procedure: COLONOSCOPY;  Surgeon: CaGatha MayerMD;  [facility];  Service: Endoscopy;  Laterality: [name];  . Cardiac catheterization  1997    Non-obstructive disease  . Cardiac catheterization  12/24/2012    mild < 20% LCx, prox 30% RCA; LVEF 55-65% , moderate pulmonary HTN, moderate AS  . Eye surgery Bilateral cataract  . Amputation Left 04/11/2013    Procedure: AMPUTATION DIGIT Left 3rd toe;  Surgeon: [name], MD;  Location: [place];  Service: Orthopedics;  Laterality: Left;  Left 3rd toe amputation at [facility]   Past Medical History  Diagnosis Date  . HYPERCHOLESTEROLEMIA-PURE   . GOUT   . OBSTRUCTIVE SLEEP APNEA   . Essential hypertension, benign   . VENOUS INSUFFICIENCY   . ASTHMA   . COPD   . VENTRAL HERNIA   . OSTEOARTHRITIS   . CATARACT, HX OF   . PULMONARY HYPERTENSION, HX OF   . Hallux valgus (acquired)   . Obesity, unspecified   . Osteoarthrosis, unspecified whether generalized or localized, unspecified site   . Unspecified glaucoma(365.9)   . Moderate aortic stenosis     Per 12/2012 echo- (VTI 1.08 cm^2, Vmax 1.11 cm ^2), mild LA dilatation  . Paroxysmal atrial  fibrillation 2003  . Lumbosacral neuritis   . Unspecified hereditary and idiopathic peripheral neuropathy   . Lumbosacral spondylosis   . CAD (coronary artery disease)     Nonobstructive by 12/2012 cath  . PUD (peptic ulcer disease)   . BPH (benign prostatic hypertrophy)   . HH (hiatus hernia)   . Glaucoma   . GERD (gastroesophageal reflux disease)   . Personal history of colonic adenoma 10/30/2012  . Shortness of breath     with exertion   BP 123/60  Pulse 75  Resp 14  Ht 6' 2"  (1.88 m)  Wt 354 lb (160.573 kg)  BMI 45.43 kg/m2  SpO2 97%     Review of Systems  Constitutional: Positive for fever and chills.  Respiratory: Positive for shortness of breath.   Gastrointestinal: Positive for nausea, vomiting and diarrhea.  Genitourinary: Positive for difficulty urinating.  Musculoskeletal: Positive for back pain and gait problem.  Neurological: Positive for weakness and numbness.  All other systems reviewed and are negative.       Objective:   Physical Exam  Absent vibratory sensation at bilateral feet and bilateral ankles to the upper leg area  Decreased proprioception in both feet Decreased light touch sensation both feet Left fourth toe with darkened nail no evidence of erythema or swelling no drainage       Assessment & Plan:  1. 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 Peripheral vascular disease with left fourth toe status post debridement, pt will follow up with his orthopedic foot surgeon. 3.  Reduced balance secondary to peripheral neuropathy refer to PT

## 2013-07-30 ENCOUNTER — Other Ambulatory Visit: Payer: Self-pay | Admitting: Internal Medicine

## 2013-07-30 ENCOUNTER — Other Ambulatory Visit: Payer: Self-pay | Admitting: Cardiology

## 2013-08-05 ENCOUNTER — Encounter: Payer: Self-pay | Admitting: Physical Medicine & Rehabilitation

## 2013-08-05 ENCOUNTER — Encounter: Payer: Medicare Other | Attending: Physical Medicine & Rehabilitation

## 2013-08-05 ENCOUNTER — Ambulatory Visit (HOSPITAL_BASED_OUTPATIENT_CLINIC_OR_DEPARTMENT_OTHER): Payer: Medicare Other | Admitting: Physical Medicine & Rehabilitation

## 2013-08-05 VITALS — BP 133/62 | HR 63 | Resp 16 | Ht 74.0 in | Wt 353.0 lb

## 2013-08-05 DIAGNOSIS — G608 Other hereditary and idiopathic neuropathies: Secondary | ICD-10-CM

## 2013-08-05 DIAGNOSIS — G894 Chronic pain syndrome: Secondary | ICD-10-CM

## 2013-08-05 DIAGNOSIS — M79609 Pain in unspecified limb: Secondary | ICD-10-CM | POA: Insufficient documentation

## 2013-08-05 DIAGNOSIS — M51379 Other intervertebral disc degeneration, lumbosacral region without mention of lumbar back pain or lower extremity pain: Secondary | ICD-10-CM | POA: Insufficient documentation

## 2013-08-05 DIAGNOSIS — M5137 Other intervertebral disc degeneration, lumbosacral region: Secondary | ICD-10-CM | POA: Insufficient documentation

## 2013-08-05 MED ORDER — GABAPENTIN 600 MG PO TABS: 600.0000 mg | ORAL_TABLET | Freq: Three times a day (TID) | ORAL | Status: DC

## 2013-08-05 MED ORDER — OXYCODONE-ACETAMINOPHEN 10-325 MG PO TABS: 1.0000 | ORAL_TABLET | Freq: Three times a day (TID) | ORAL | Status: DC | PRN

## 2013-08-05 MED ORDER — TRAMADOL-ACETAMINOPHEN 37.5-325 MG PO TABS: ORAL_TABLET | ORAL | Status: DC

## 2013-08-05 NOTE — Progress Notes (Signed)
Subjective:    Patient ID: Tim [name], male    DOB: 1940-09-24, 73 y.o.   MRN: [id number]  HPI Pain and reduced from a 01/02/2003 on average this visit. Increase gabapentin from 400-600 mg 3 times per day. Pain Inventory Average Pain 4 Pain Right Now 2 My pain is n/a  In the last 24 hours, has pain interfered with the following? General activity 7 Relation with others 7 Enjoyment of life 3 What TIME of day is your pain at its worst? night Sleep (in general) Fair  Pain is worse with: walking, bending and standing Pain improves with: rest, pacing activities and medication Relief from Meds: 7  Mobility use a cane how many minutes can you walk? 5 ability to climb steps?  no do you drive?  yes  Function retired I need assistance with the following:  household duties and shopping  Neuro/Psych bladder control problems weakness numbness trouble walking  Prior Studies Any changes since last visit?  no  Physicians involved in your care Any changes since last visit?  no   Family History  Problem Relation Age of Onset  . Hypertension Mother   . Coronary artery disease Mother   . Heart attack Mother   . Pulmonary fibrosis Father     asbestosis   History   Social History  . Marital Status: Married    Spouse Name: [name]    Number of Children: 0  . Years of Education: 20   Occupational History  . engineering     retired   Social History Main Topics  . Smoking status: Never Smoker   . Smokeless tobacco: Never Used  . Alcohol Use: No  . Drug Use: Yes    Special: Oxycodone  . Sexual Activity: Not Currently   Other Topics Concern  . None   Social History Narrative   HSG, [name] - BS, [name] - MS-engineering, 2 years on PhD - [name]. Married - '65 - 44yr/divorced; '76- 3 yrs/divorced; '92 . No children. 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 ACP/Living Will - Yes CPR; long-term Mechanical ventilation as long as he  was able to cognate; ok for long term artificial nutrition. Precondition being able to cognate and not to have too much pain.    Past Surgical History  Procedure Laterality Date  . Hemorrhoid surgery    . Vitrectomy    . Intraocular lens insertion Bilateral   . Leg surgery Bilateral   . Knee surgery Right   . Traeculectomy Left     eye  . Shoulder arthroscopy  08/30/2011    Procedure: ARTHROSCOPY SHOULDER;  Surgeon: [name] MD;  [place] [facility]  Service: Orthopedics;  Laterality: Right;  Right Shoulder Arthroscopy, Debridement, and Decompression  . Tonsillectomy    . Colonoscopy    . Cholecystectomy    . Toe amputation    . Colonoscopy [name] 10/30/2012    Procedure: COLONOSCOPY;  Surgeon: CGatha Mayer MD;  [facility];  Service: Endoscopy;  Laterality: [name];  . Cardiac catheterization  1997    Non-obstructive disease  . Cardiac catheterization  12/24/2012    mild < 20% LCx, prox 30% RCA; LVEF 55-65% , moderate pulmonary HTN, moderate AS  . Eye surgery Bilateral cataract  . Amputation Left 04/11/2013    Procedure: AMPUTATION DIGIT Left 3rd toe;  Surgeon: [name] MD;  [place] [facility]  Service: Orthopedics;  Laterality: Left;  Left 3rd toe amputation at  [facility]   Past Medical History  Diagnosis Date  . HYPERCHOLESTEROLEMIA-PURE   . GOUT   . OBSTRUCTIVE SLEEP APNEA   . Essential hypertension, benign   . VENOUS INSUFFICIENCY   . ASTHMA   . COPD   . VENTRAL HERNIA   . OSTEOARTHRITIS   . CATARACT, HX OF   . PULMONARY HYPERTENSION, HX OF   . Hallux valgus (acquired)   . Obesity, unspecified   . Osteoarthrosis, unspecified whether generalized or localized, unspecified site   . Unspecified glaucoma   . Moderate aortic stenosis     Per 12/2012 echo- (VTI 1.08 cm^2, Vmax 1.11 cm ^2), mild LA dilatation  . Paroxysmal atrial fibrillation 2003  . Lumbosacral neuritis   . Unspecified hereditary and idiopathic peripheral neuropathy   . Lumbosacral spondylosis   .  CAD (coronary artery disease)     Nonobstructive by 12/2012 cath  . PUD (peptic ulcer disease)   . BPH (benign prostatic hypertrophy)   . HH (hiatus hernia)   . Glaucoma   . GERD (gastroesophageal reflux disease)   . Personal history of colonic adenoma 10/30/2012  . Shortness of breath     with exertion   BP 133/62  Pulse 63  Resp 16  Ht 6' 2"  (1.88 m)  Wt 353 lb (160.12 kg)  BMI 45.30 kg/m2  SpO2 97%  Opioid Risk Score: 4 Fall Risk Score: High Fall Risk (>13 points) (patient educated handout declined)   Review of Systems  Constitutional: Positive for unexpected weight change.  Gastrointestinal: Positive for diarrhea.  Genitourinary: Positive for difficulty urinating.  Musculoskeletal: Positive for back pain and gait problem.  Neurological: Positive for weakness and numbness.  All other systems reviewed and are negative.       Objective:   Physical Exam  Absent vibratory sensation at bilateral feet and bilateral ankles to the upper leg area  Decreased proprioception in both feet  Decreased light touch sensation both feet  Left fourth toe with darkened nail no evidence of erythema or swelling  No evidence of skin breakdown in both lower extremities. Stasis dermatitis changes in both lower limbs. Sensation normalize is at the tibial tuberosity bilateral      Assessment & Plan:  1. 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 Peripheral vascular disease with left fourth toe status post debridement, pt will follow up with his orthopedic foot surgeon.  3. Reduced balance secondary to peripheral neuropathy refer to PT, waiting to schedule for first evaluation visit

## 2013-08-05 NOTE — Patient Instructions (Signed)
Next appointment in 2 months Oxycodone reduced from 90-75 tablets based on usage pattern Continue current dose of Neurontin 600 mg 4 times per day Continue Ultracet 1 tablet 4 times per day  Scheduled physical therapy evaluation Followup orthopedics as recommended

## 2013-08-06 DIAGNOSIS — G894 Chronic pain syndrome: Secondary | ICD-10-CM | POA: Insufficient documentation

## 2013-08-12 ENCOUNTER — Ambulatory Visit: Payer: Medicare Other | Admitting: Physical Therapy

## 2013-08-15 ENCOUNTER — Other Ambulatory Visit: Payer: Self-pay

## 2013-08-15 MED ORDER — NIACIN ER (ANTIHYPERLIPIDEMIC) 1000 MG PO TBCR: 1000.0000 mg | EXTENDED_RELEASE_TABLET | Freq: Two times a day (BID) | ORAL | Status: DC

## 2013-08-20 ENCOUNTER — Telehealth: Payer: Self-pay | Admitting: *Deleted

## 2013-08-20 NOTE — Telephone Encounter (Signed)
[facility]acin

## 2013-08-21 NOTE — Telephone Encounter (Signed)
Niacin generic does not have to have PA, niaspan brand does need PA, called patients pharmacy to dispense generic medication.

## 2013-08-26 ENCOUNTER — Other Ambulatory Visit: Payer: Self-pay | Admitting: Cardiology

## 2013-08-26 ENCOUNTER — Ambulatory Visit: Payer: Medicare Other | Admitting: Rehabilitative and Restorative Service Providers"

## 2013-09-02 ENCOUNTER — Ambulatory Visit: Payer: Medicare Other | Attending: Physical Medicine & Rehabilitation | Admitting: Physical Therapy

## 2013-09-02 DIAGNOSIS — IMO0001 Reserved for inherently not codable concepts without codable children: Secondary | ICD-10-CM | POA: Insufficient documentation

## 2013-09-02 DIAGNOSIS — R262 Difficulty in walking, not elsewhere classified: Secondary | ICD-10-CM | POA: Insufficient documentation

## 2013-09-02 DIAGNOSIS — M6281 Muscle weakness (generalized): Secondary | ICD-10-CM | POA: Insufficient documentation

## 2013-09-03 ENCOUNTER — Telehealth: Payer: Self-pay

## 2013-09-03 NOTE — Telephone Encounter (Signed)
Left patient a voicemail regarding pharmacy request that was sent to [name] from [facility] for Tramadol. Patient was given a written RX for Tramadol at his 2/3 OV with 3 add'l refills. Pharmacy does not have that RX. I was calling to see if the patient has it.

## 2013-09-09 ENCOUNTER — Ambulatory Visit: Payer: Medicare Other | Admitting: Physical Therapy

## 2013-09-10 ENCOUNTER — Other Ambulatory Visit (INDEPENDENT_AMBULATORY_CARE_PROVIDER_SITE_OTHER): Payer: Medicare Other

## 2013-09-10 ENCOUNTER — Ambulatory Visit (INDEPENDENT_AMBULATORY_CARE_PROVIDER_SITE_OTHER): Payer: Medicare Other | Admitting: Internal Medicine

## 2013-09-10 ENCOUNTER — Encounter: Payer: Self-pay | Admitting: Internal Medicine

## 2013-09-10 VITALS — BP 126/70 | HR 72 | Temp 97.9°F | Ht 74.0 in | Wt 357.6 lb

## 2013-09-10 DIAGNOSIS — I872 Venous insufficiency (chronic) (peripheral): Secondary | ICD-10-CM

## 2013-09-10 DIAGNOSIS — M109 Gout, unspecified: Secondary | ICD-10-CM

## 2013-09-10 DIAGNOSIS — E7800 Pure hypercholesterolemia, unspecified: Secondary | ICD-10-CM

## 2013-09-10 DIAGNOSIS — I2510 Atherosclerotic heart disease of native coronary artery without angina pectoris: Secondary | ICD-10-CM

## 2013-09-10 DIAGNOSIS — I10 Essential (primary) hypertension: Secondary | ICD-10-CM

## 2013-09-10 DIAGNOSIS — Z23 Encounter for immunization: Secondary | ICD-10-CM

## 2013-09-10 DIAGNOSIS — E669 Obesity, unspecified: Secondary | ICD-10-CM

## 2013-09-10 DIAGNOSIS — K219 Gastro-esophageal reflux disease without esophagitis: Secondary | ICD-10-CM

## 2013-09-10 DIAGNOSIS — G4733 Obstructive sleep apnea (adult) (pediatric): Secondary | ICD-10-CM

## 2013-09-10 DIAGNOSIS — Z0000 Encounter for general adult medical examination without abnormal findings: Secondary | ICD-10-CM

## 2013-09-10 LAB — LIPID PANEL
CHOL/HDL RATIO: 2
Cholesterol: 120 mg/dL (ref 0–200)
HDL: 60 mg/dL (ref >39.00)
LDL Cholesterol: 37 mg/dL (ref 0–99)
Triglycerides: 114 mg/dL (ref 0.0–149.0)
VLDL: 22.8 mg/dL (ref 0.0–40.0)

## 2013-09-10 LAB — URIC ACID: URIC ACID, SERUM: 3.5 mg/dL — AB (ref 4.0–7.8)

## 2013-09-10 NOTE — Progress Notes (Signed)
   Subjective:    Patient ID: Tim [name], male    DOB: October 31, 1940, 73 y.o.   MRN: [id number]  HPI The patient is here for annual Medicare wellness examination and management of other chronic and acute problems.  In the interval he had GI eval for irregular bowel habit with GI consult/colonoscopy per Dr. Carlean Purl April '14 ; he was hospitalized for chest pain: had a cath June '14 - no obstructive disease; had 2D eco June '14 - moderate,and progressive, aortic stenosis; amputation 3rd toe left foot 2/2 ulcer October '14. He is seeing Dr. Ella Bodo for rehab and pain management.  At today's visit the only complaint is the on-going diarrhea. He will be seeing Dr. Carlean Purl for follow up Thursday, March 12th.   The risk factors are reflected in the social history.  The roster of all physicians providing medical care to patient - is listed in the Snapshot section of the chart.  Activities of daily living:  The patient is 100% inedpendent in all ADLs: dressing, toileting, feeding as well as independent mobility  Home safety : The patient has smoke detectors in the home. They wear seatbelts. Has firearms at home - not used. There is no violence in the home.   There is no risks for hepatitis, STDs or HIV. There is no   history of blood transfusion. They have no travel history to infectious disease endemic areas of the world.  The patient has seen their dentist in the last six month. They have seen their eye doctor in the last year. They admit hearing difficulty and have not had audiologic testing in the last year.    They do not  have excessive sun exposure. Discussed the need for sun protection: hats, long sleeves and use of sunscreen if there is significant sun exposure.   Diet: the importance of a healthy diet is discussed. They do have a healthy diet.  The patient has a regular exercise program: enrolled in neuro/physical therapy program.  The benefits of regular aerobic exercise were  discussed.  Depression screen: there are no signs or vegative symptoms of depression- irritability, change in appetite, anhedonia, sadness/tearfullness.  Cognitive assessment: the patient manages all their financial and personal affairs and is actively engaged. They could relate day,date,year and events; recalled 3/3 objects at 3 minutes; performed clock-face test normally.  The following portions of the patient's history were reviewed and updated as appropriate: allergies, current medications, past family history, past medical history,  past surgical history, past social history  and problem list.  Vision, hearing, body mass index were assessed and reviewed.   During the course of the visit the patient was educated and counseled about appropriate screening and preventive services including : fall prevention , diabetes screening, nutrition counseling, colorectal cancer screening, and recommended immunizations.    Review of Systems     Objective:   Physical Exam        Assessment & Plan:

## 2013-09-10 NOTE — Progress Notes (Signed)
Pre visit review using our clinic review tool, if applicable. No additional management support is needed unless otherwise documented below in the visit note. 

## 2013-09-10 NOTE — Patient Instructions (Signed)
Thanks for being such a good patient over the years.  Your exam is ok but, as you know, loosing weight remains important. Remember it is all about the portion size.  Labs are current except for lipid and uric acid which will be done today with results posted to MyChart.  Your immunizations are up to date except Prevnar pneumonia vaccine which will be administered today.  You are current with colorectal cancer screening and have aged out of prostate cancer screening.  Dr. Carlean Purl will be in charge of the bowel issues.  Continuing care - Dr. Ricardo Jericho at the [name] office.

## 2013-09-11 ENCOUNTER — Ambulatory Visit (INDEPENDENT_AMBULATORY_CARE_PROVIDER_SITE_OTHER): Payer: Medicare Other | Admitting: Internal Medicine

## 2013-09-11 ENCOUNTER — Encounter: Payer: Self-pay | Admitting: Internal Medicine

## 2013-09-11 VITALS — BP 132/68 | HR 64 | Ht 74.0 in | Wt 355.6 lb

## 2013-09-11 DIAGNOSIS — K589 Irritable bowel syndrome without diarrhea: Secondary | ICD-10-CM | POA: Insufficient documentation

## 2013-09-11 DIAGNOSIS — R198 Other specified symptoms and signs involving the digestive system and abdomen: Secondary | ICD-10-CM

## 2013-09-11 DIAGNOSIS — R194 Change in bowel habit: Secondary | ICD-10-CM

## 2013-09-11 NOTE — Assessment & Plan Note (Signed)
Cause not entirely clear - but given negative colonoscopy and overall pattern suspect IBS. He will try adding a probiotoic to current regimen. If that fails consider round of metronidazole ? Intermittent problems with SIBO Does not seem to be medications

## 2013-09-11 NOTE — Progress Notes (Signed)
         Subjective:    Patient ID: Tim [name], male    DOB: 03/27/1941, 72 y.o.   MRN: [id number]  HPI This is an elderly white man with multiple medical problems seen by me last year with intermittent constipation and diarrhea type problems. Colonoscopy showed diverticulosis as the main finding. He was recommended to try a high-fiber diet. I thought he might be having problems with constipation and unloading of stools. Since that time he says high-fiber diet did not help, Benefiber has not helped. He is using 3 stool softeners a day no longer has constipation problems but he will have days where he starts with a relatively normal stool and then have multiple progressively looser stools. Are associated with explosive gas. Then he'll go 2 or 3 days without defecation. There is no real abdominal pain or cramping problems. No bleeding. He has about 3 or 4 spells of this a month, otherwise he has bowel movements about every 2-3 days. He denies milk or lactose intolerance, sugar substitutes. He does drink L. water which has magnesium sulfate in it. However he was on that before the symptoms began.  Medications, allergies, past medical history, past surgical history, family history and social history are reviewed and updated in the EMR.  Review of Systems As above    Objective:   Physical Exam Obese NAD abd obese soft and nontender    Assessment & Plan:  Change in bowel habits intermittent loose stools Cause not entirely clear - but given negative colonoscopy and overall pattern suspect IBS. He will try adding a probiotoic to current regimen. If that fails consider round of metronidazole ? Intermittent problems with SIBO Does not seem to be medications   IBS (irritable bowel syndrome) His gastrointestinal symptom complex is consistent with this.

## 2013-09-11 NOTE — Patient Instructions (Signed)
Please start a probiotic , take daily.  Follow up with [name] as needed.    I appreciate the opportunity to care for you.

## 2013-09-11 NOTE — Assessment & Plan Note (Signed)
His gastrointestinal symptom complex is consistent with this.

## 2013-09-12 ENCOUNTER — Ambulatory Visit: Payer: Medicare Other | Admitting: Physical Therapy

## 2013-09-13 NOTE — Assessment & Plan Note (Signed)
No recent flares.  Plan Continue allopurinol  Check Uric Acid level  Addendum: uric acid 3.5

## 2013-09-13 NOTE — Assessment & Plan Note (Signed)
No cardiac symptoms reported at today's visit.  Plan Continue present medications and risk reduction.

## 2013-09-13 NOTE — Assessment & Plan Note (Signed)
Discussed the importance of weight management (again) as it pertains to many of his medical problems. He has been working on [employer] issues but has trouble with regular exercise.  Plan Reemphasized need for portion size control and some form of exercise for calorie burn. Goal is to loose 1-2 lbs a month.

## 2013-09-13 NOTE — Assessment & Plan Note (Signed)
Doing better at this time w/o stasis ulcers. He does use support hose.  Plan Continued use of support hose  Weight management

## 2013-09-13 NOTE — Assessment & Plan Note (Signed)
Generally well controlled on present medications.  Plan Weight management  F/u Dr. Carlean Purl for GI

## 2013-09-13 NOTE — Assessment & Plan Note (Signed)
BP Readings from Last 3 Encounters:  09/11/13 132/68  09/10/13 126/70  08/05/13 133/62   Good control on present medications. Will continue the same.

## 2013-09-13 NOTE — Assessment & Plan Note (Signed)
Interval history - many medical problems but not acute exacerbations. Main complaint - loose stools with frequency. Physical exam notable for obesity. Lab reviewed  - October '14 Cmet, CBC normal; Lipids today - excellent with LDL 37. He will be seeing Dr. Carlean Purl who will determine need for colonoscopy. Immunizations - current and given Prevnar today. ACP - reviewed with him: remains full code including intubation if needed.  In summary A nice man with a complex set of medical problems. All told he is medically stable at this time except for issues with loose stools. He will need to return in 6 months for interim follow-up.

## 2013-09-13 NOTE — Assessment & Plan Note (Signed)
Followed in [place]. He is doing well per last correspondence from Dr. Neysa Bonito.

## 2013-09-16 ENCOUNTER — Ambulatory Visit: Payer: Medicare Other | Admitting: Physical Therapy

## 2013-09-17 ENCOUNTER — Telehealth: Payer: Self-pay | Admitting: Family Medicine

## 2013-09-17 NOTE — Telephone Encounter (Signed)
Message copied by Nickola Major on Wed Sep 17, 2013  9:45 AM ------      Message from: Tammi Sou      Created: Wed Sep 17, 2013  9:08 AM      Regarding: RE: Transfer from Dr. Linda Hedges       6 mo: 30 min visit-thx      ----- Message -----         From: Nickola Major         Sent: 09/16/2013   2:36 PM           To: Tammi Sou, MD      Subject: FW: Transfer from Dr. Patience Musca,      This patient is transferring his care to you. He had a medicare wellness visit 09/10/13. When would you like for me to schedule his next visit? Thanks, Diane.       ----- Message -----         From: Sherral Hammers         Sent: 09/11/2013   8:38 AM           To: Nickola Major      Subject: Transfer from Dr. Linda Hedges                                 This is the patient I spoke to you about.  He had his physical yesterday with Dr. Linda Hedges.             ------

## 2013-09-17 NOTE — Telephone Encounter (Signed)
Left message for patient to CB to schedule 6 month checkup with Dr. Anitra Lauth

## 2013-09-19 ENCOUNTER — Ambulatory Visit: Payer: Medicare Other | Admitting: Physical Therapy

## 2013-09-23 ENCOUNTER — Ambulatory Visit: Payer: Medicare Other | Admitting: Physical Therapy

## 2013-09-24 NOTE — Telephone Encounter (Signed)
LM for patient to schedule 6 month checkup in Sept.

## 2013-09-24 NOTE — Telephone Encounter (Signed)
Message copied by Nickola Major on Wed Sep 24, 2013 10:54 AM ------      Message from: Tammi Sou      Created: Wed Sep 17, 2013  9:08 AM      Regarding: RE: Transfer from Dr. Linda Hedges       6 mo: 30 min visit-thx      ----- Message -----         From: Nickola Major         Sent: 09/16/2013   2:36 PM           To: Tammi Sou, MD      Subject: FW: Transfer from Dr. Patience Musca,      This patient is transferring his care to you. He had a medicare wellness visit 09/10/13. When would you like for me to schedule his next visit? Thanks, Diane.       ----- Message -----         From: Sherral Hammers         Sent: 09/11/2013   8:38 AM           To: Nickola Major      Subject: Transfer from Dr. Linda Hedges                                 This is the patient I spoke to you about.  He had his physical yesterday with Dr. Linda Hedges.             ------

## 2013-09-25 ENCOUNTER — Ambulatory Visit: Payer: Medicare Other | Admitting: Physical Therapy

## 2013-09-25 ENCOUNTER — Encounter: Payer: Self-pay | Admitting: Family Medicine

## 2013-09-25 ENCOUNTER — Ambulatory Visit (INDEPENDENT_AMBULATORY_CARE_PROVIDER_SITE_OTHER): Payer: Medicare Other | Admitting: Family Medicine

## 2013-09-25 VITALS — BP 143/83 | HR 61 | Temp 97.4°F | Resp 18 | Ht 73.0 in | Wt 357.0 lb

## 2013-09-25 DIAGNOSIS — L304 Erythema intertrigo: Secondary | ICD-10-CM | POA: Insufficient documentation

## 2013-09-25 DIAGNOSIS — R310 Gross hematuria: Secondary | ICD-10-CM

## 2013-09-25 DIAGNOSIS — L538 Other specified erythematous conditions: Secondary | ICD-10-CM

## 2013-09-25 LAB — CBC WITH DIFFERENTIAL/PLATELET
BASOS ABS: 0 10*3/uL (ref 0.0–0.1)
Basophils Relative: 0.4 % (ref 0.0–3.0)
Eosinophils Absolute: 0.1 10*3/uL (ref 0.0–0.7)
Eosinophils Relative: 1.7 % (ref 0.0–5.0)
HCT: 37.5 % — ABNORMAL LOW (ref 39.0–52.0)
Hemoglobin: 12.5 g/dL — ABNORMAL LOW (ref 13.0–17.0)
LYMPHS ABS: 2.2 10*3/uL (ref 0.7–4.0)
Lymphocytes Relative: 27.2 % (ref 12.0–46.0)
MCHC: 33.5 g/dL (ref 30.0–36.0)
MCV: 92.9 fl (ref 78.0–100.0)
Monocytes Absolute: 0.6 10*3/uL (ref 0.1–1.0)
Monocytes Relative: 7.8 % (ref 3.0–12.0)
NEUTROS PCT: 62.9 % (ref 43.0–77.0)
Neutro Abs: 5 10*3/uL (ref 1.4–7.7)
PLATELETS: 196 10*3/uL (ref 150.0–400.0)
RBC: 4.03 Mil/uL — ABNORMAL LOW (ref 4.22–5.81)
RDW: 14.3 % (ref 11.5–14.6)
WBC: 7.9 10*3/uL (ref 4.5–10.5)

## 2013-09-25 LAB — POCT URINALYSIS DIPSTICK
BILIRUBIN UA: NEGATIVE
Glucose, UA: NEGATIVE
KETONES UA: NEGATIVE
Nitrite, UA: NEGATIVE
PROTEIN UA: NEGATIVE
Spec Grav, UA: >=1.03
Urobilinogen, UA: 0.2
pH, UA: 5.5

## 2013-09-25 LAB — BASIC METABOLIC PANEL
BUN: 15 mg/dL (ref 6–23)
CALCIUM: 9.3 mg/dL (ref 8.4–10.5)
CO2: 28 meq/L (ref 19–32)
CREATININE: 0.8 mg/dL (ref 0.4–1.5)
Chloride: 103 mEq/L (ref 96–112)
GFR: 94.98 mL/min (ref >60.00)
GLUCOSE: 118 mg/dL — AB (ref 70–99)
Potassium: 5 mEq/L (ref 3.5–5.1)
Sodium: 138 mEq/L (ref 135–145)

## 2013-09-25 LAB — PSA: PSA: 0.4 ng/mL (ref 0.10–4.00)

## 2013-09-25 MED ORDER — CLOTRIMAZOLE-BETAMETHASONE 1-0.05 % EX CREA: 1.0000 "application " | TOPICAL_CREAM | Freq: Two times a day (BID) | CUTANEOUS | Status: DC

## 2013-09-25 NOTE — Progress Notes (Signed)
Office Note 09/25/2013  CC:  Chief Complaint  Patient presents with  . Hematuria    two days    HPI:  Tim [name] is a 73 y.o. White male who is here as transfer pt from Dr. Bethann Goo discuss blood in urine.  (Other problems: recurrent rash in abd/breast skin folds getting worse lately. Also, hx of recurrent LE venous stasis with cellulitis--comes on quickly and progresses quickly but usually responds well to doxycycline.  In past, has had rx's prefilled for home use prn and we agreed I'd do this for him in the future as long as he knew to come in if not improving as expected--60 doxycycline usually lasts 1 yr).  CURRENT PROBLEM: Approx 1 yr hx of intermittent, brief gross hematuria--asymptomatic.   Most recently, 2 nights ago he had blood in urine with clots and he has had a few more episodes of gross hematuria since then.  No pain or fever last few days.  However, last week he had a few episodes of mild burning in penis and under scrotal area with urination ( but no blood).   No abd pain, no nausea, no back or flank pain.  Denies hx of prostate infection but has had BPH treated with proscar for approx 20+ yrs.  Denies hx of elevated PSA.  Describes a remote hx of gross hematuria while in hosp and was seen by Burman Freestone -says >15 yrs ago, doesn't recall why but was given a med that turned his urine blue and nothing more came of it.  Has been on eliquis bid lately, and coumadin prior to that for 10 yrs or so for paroxysmal a-fib.  Old records in [facility] EMR were reviewed prior to or during today's visit.  Past Medical History  Diagnosis Date  . HYPERCHOLESTEROLEMIA-PURE   . GOUT   . OBSTRUCTIVE SLEEP APNEA   . Essential hypertension, benign   . VENOUS INSUFFICIENCY   . ASTHMA   . COPD   . VENTRAL HERNIA   . OSTEOARTHRITIS   . CATARACT, HX OF   . PULMONARY HYPERTENSION, HX OF   . Hallux valgus (acquired)   . Obesity, unspecified   . Osteoarthrosis, unspecified whether  generalized or localized, unspecified site   . Unspecified glaucoma   . Moderate aortic stenosis     Per 12/2012 echo- (VTI 1.08 cm^2, Vmax 1.11 cm ^2), mild LA dilatation  . Paroxysmal atrial fibrillation 2003  . Lumbosacral neuritis   . Unspecified hereditary and idiopathic peripheral neuropathy   . Lumbosacral spondylosis   . CAD (coronary artery disease)     Nonobstructive by 12/2012 cath  . PUD (peptic ulcer disease)   . BPH (benign prostatic hypertrophy)   . HH (hiatus hernia)   . Glaucoma   . GERD (gastroesophageal reflux disease)   . Personal history of colonic adenoma 10/30/2012  . Shortness of breath     with exertion    Past Surgical History  Procedure Laterality Date  . Hemorrhoid surgery    . Vitrectomy    . Intraocular lens insertion Bilateral   . Leg surgery Bilateral   . Knee surgery Right   . Traeculectomy Left     eye  . Shoulder arthroscopy  08/30/2011    Procedure: ARTHROSCOPY SHOULDER;  Surgeon: [name], MD;  Location: [place];  Service: Orthopedics;  Laterality: Right;  Right Shoulder Arthroscopy, Debridement, and Decompression  . Tonsillectomy    . Colonoscopy    . Cholecystectomy    .  Toe amputation    . Colonoscopy [name] 10/30/2012    Procedure: COLONOSCOPY;  Surgeon: [name], MD;  Location: [facility];  Service: Endoscopy;  Laterality: [name];  . Cardiac catheterization  1997    Non-obstructive disease  . Cardiac catheterization  12/24/2012    mild < 20% LCx, prox 30% RCA; LVEF 55-65% , moderate pulmonary HTN, moderate AS  . Eye surgery Bilateral cataract  . Amputation Left 04/11/2013    Procedure: AMPUTATION DIGIT Left 3rd toe;  Surgeon: [name], MD;  Location: [place];  Service: Orthopedics;  Laterality: Left;  Left 3rd toe amputation at [facility]    Family History  Problem Relation Age of Onset  . Hypertension Mother   . Coronary artery disease Mother   . Heart attack Mother   . Pulmonary fibrosis Father     asbestosis    History    Social History  . Marital Status: Married    Spouse Name: [name]    Number of Children: 0  . Years of Education: 20   Occupational History  . engineering     retired   Social History Main Topics  . Smoking status: Never Smoker   . Smokeless tobacco: Never Used  . Alcohol Use: No  . Drug Use: Yes    Special: Oxycodone  . Sexual Activity: Not Currently   Other Topics Concern  . Not on file   Social History Narrative   HSG, [name] - BS, [name] - MS-engineering, 2 years on PhD - [employer]. Married - '65 - 44yr/divorced; '76- 3 yrs/divorced; '92 . No children. 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 ACP/Living Will - Yes CPR; long-term Mechanical ventilation as long as he was able to cognate; ok for long term artificial nutrition. Precondition being able to cognate and not to have too much pain.     Outpatient Encounter Prescriptions as of 09/25/2013  Medication Sig  . albuterol (PROVENTIL HFA;VENTOLIN HFA) 108 (90 BASE) MCG/ACT inhaler Inhale 2 puffs into the lungs 4 (four) times daily as needed for wheezing or shortness of breath.  . allopurinol (ZYLOPRIM) 300 MG tablet take 1 tablet by mouth once daily  . apixaban (ELIQUIS) 5 MG TABS tablet Take 1 tablet (5 mg total) by mouth 2 (two) times daily.  .Marland Kitchenezetimibe (ZETIA) 10 MG tablet Take 10 mg by mouth daily.  . finasteride (PROSCAR) 5 MG tablet take 1 tablet by mouth once daily  . Fluticasone-Salmeterol (ADVAIR) 250-50 MCG/DOSE AEPB Inhale 1 puff into the lungs daily as needed (for nasal congestion).   . gabapentin (NEURONTIN) 600 MG tablet Take 1 tablet (600 mg total) by mouth 4 (four) times daily - after meals and at bedtime.  .Marland Kitchenlosartan (COZAAR) 50 MG tablet take 1 tablet by mouth twice a day  . metoprolol (TOPROL-XL) 200 MG 24 hr tablet take 2 tablets by mouth once daily  . Multiple Vitamin (MULTIVITAMIN WITH MINERALS) TABS Take 1 tablet by mouth daily.  . niacin (NIASPAN) 1000 MG CR tablet  Take 1 tablet (1,000 mg total) by mouth 2 (two) times daily.  .Marland Kitchenomeprazole (PRILOSEC) 10 MG capsule take 1 capsule by mouth once daily  . OVER THE COUNTER MEDICATION OTC stool softner, takes 3 daily.  .Marland KitchenoxyCODONE-acetaminophen (PERCOCET) 10-325 MG per tablet Take 1 tablet by mouth every 8 (eight) hours as needed for pain.  . potassium chloride SA (K-DUR,KLOR-CON) 20 MEQ tablet Take 1 tablet (20 mEq total) by mouth daily.  .[name]  rosuvastatin (CRESTOR) 40 MG tablet Take 40 mg by mouth daily.   . Thiamine HCl (VITAMIN B-1) 250 MG tablet Take 250 mg by mouth every evening.  . traMADol-acetaminophen (ULTRACET) 37.5-325 MG per tablet take 1 tablet by mouth every 6 hours if needed for pain - MUST LAST 30 DAYS  . vitamin C (ASCORBIC ACID) 250 MG tablet Take 250 mg by mouth every evening.  . clotrimazole-betamethasone (LOTRISONE) cream Apply 1 application topically 2 (two) times daily.  . fluconazole (DIFLUCAN) 100 MG tablet   . [DISCONTINUED] tiotropium (SPIRIVA) 18 MCG inhalation capsule Place 18 mcg into inhaler and inhale daily.    Allergies  Allergen Reactions  . Brimonidine Tartrate     ALPHAGAN-Shortness of breath  . Brinzolamide     AZOPT- Shortness of breath  . Celecoxib     CELLBREX-confusion  . Codeine     Childhood reaction  . Colchicine     diarrhea  . Diltiazem      leg swelling  . Latanoprost     XALATAN- Shortness of breath  . Rofecoxib      VIOXX- leg swelling  . Timolol Maleate     TIMOPTIC- Aggravated asthma  . Vancomycin     hives/blisters    ROS See HPI.  Also, no CP, no SOB.  No blood in stool.  No nose bleeds. PE; Blood pressure 143/83, pulse 61, temperature 97.4 F (36.3 C), temperature source Temporal, resp. rate 18, height 6' 1"  (1.854 m), weight 357 lb (161.934 kg), SpO2 98.00%. Gen: Alert, well appearing, morbidly obese.  Patient is oriented to person, place, time, and situation. NID:POEU: no injection, icteris, swelling, or exudate.  EOMI, PERRLA. Mouth:  lips without lesion/swelling.  Oral mucosa pink and moist. Oropharynx without erythema, exudate, or swelling.  CV: RRR, soft systolic murmur, distant S1 and S2. Chest is clear, no wheezing or rales. Normal symmetric air entry throughout both lung fields. No chest wall deformities or tenderness. GU: no scrotal or testicular abnormalities.  I could not get his penis out from the suprapubic fat enough for inspection of the glans or shaft.   Rectal exam: negative without mass, lesions or tenderness, PROSTATE EXAM: smooth and symmetric without nodules or tenderness SKIN: diffuse, deep pink macular rash with well demarcated borders and scattered macerated regions --this is present under breast fatty tissue and under abdominal pannus and in groin creases.  Pertinent labs:  UA today: large blood, trace LEU  ASSESSMENT AND PLAN:   Transfer pt:  Gross hematuria No clear etiology from today's history or exam. Will send urine for culture but UTI is doubtful.  Will refer to urology for further evaluation. Check CBC and BMET today. As long as Hb is stable, will keep him on his eliquis.  Intertrigo Widespread, persistent/chronic per pt's report. Describes no significant success with past trials of oral fluconazole. Will rx lotrisone cream to apply bid prn.  Encouraged pt to try to keep the areas dry.   An After Visit Summary was printed and given to the patient.  Return if symptoms worsen or fail to improve.

## 2013-09-25 NOTE — Progress Notes (Signed)
Pre visit review using our clinic review tool, if applicable. No additional management support is needed unless otherwise documented below in the visit note. 

## 2013-09-25 NOTE — Assessment & Plan Note (Signed)
Widespread, persistent/chronic per pt's report. Describes no significant success with past trials of oral fluconazole. Will rx lotrisone cream to apply bid prn.  Encouraged pt to try to keep the areas dry.

## 2013-09-25 NOTE — Assessment & Plan Note (Signed)
No clear etiology from today's history or exam. Will send urine for culture but UTI is doubtful.  Will refer to urology for further evaluation. Check CBC and BMET today. As long as Hb is stable, will keep him on his eliquis.

## 2013-09-26 LAB — URINE CULTURE

## 2013-09-30 ENCOUNTER — Ambulatory Visit: Payer: Medicare Other | Admitting: Physical Therapy

## 2013-10-01 ENCOUNTER — Ambulatory Visit: Payer: Medicare Other | Admitting: Physical Therapy

## 2013-10-01 ENCOUNTER — Other Ambulatory Visit: Payer: Self-pay | Admitting: Cardiology

## 2013-10-01 ENCOUNTER — Other Ambulatory Visit (INDEPENDENT_AMBULATORY_CARE_PROVIDER_SITE_OTHER): Payer: Medicare Other

## 2013-10-01 DIAGNOSIS — D649 Anemia, unspecified: Secondary | ICD-10-CM

## 2013-10-02 ENCOUNTER — Encounter
Payer: Medicare Other | Attending: Physical Medicine and Rehabilitation | Admitting: Physical Medicine and Rehabilitation

## 2013-10-02 ENCOUNTER — Encounter: Payer: Self-pay | Admitting: Physical Medicine and Rehabilitation

## 2013-10-02 ENCOUNTER — Other Ambulatory Visit: Payer: Self-pay | Admitting: Family Medicine

## 2013-10-02 VITALS — BP 129/44 | HR 58 | Resp 14 | Ht 73.0 in | Wt 358.8 lb

## 2013-10-02 DIAGNOSIS — I4891 Unspecified atrial fibrillation: Secondary | ICD-10-CM | POA: Insufficient documentation

## 2013-10-02 DIAGNOSIS — J449 Chronic obstructive pulmonary disease, unspecified: Secondary | ICD-10-CM | POA: Insufficient documentation

## 2013-10-02 DIAGNOSIS — G894 Chronic pain syndrome: Secondary | ICD-10-CM | POA: Insufficient documentation

## 2013-10-02 DIAGNOSIS — M51379 Other intervertebral disc degeneration, lumbosacral region without mention of lumbar back pain or lower extremity pain: Secondary | ICD-10-CM | POA: Insufficient documentation

## 2013-10-02 DIAGNOSIS — E7800 Pure hypercholesterolemia, unspecified: Secondary | ICD-10-CM | POA: Insufficient documentation

## 2013-10-02 DIAGNOSIS — G608 Other hereditary and idiopathic neuropathies: Secondary | ICD-10-CM | POA: Insufficient documentation

## 2013-10-02 DIAGNOSIS — M5137 Other intervertebral disc degeneration, lumbosacral region: Secondary | ICD-10-CM | POA: Insufficient documentation

## 2013-10-02 DIAGNOSIS — J4489 Other specified chronic obstructive pulmonary disease: Secondary | ICD-10-CM | POA: Insufficient documentation

## 2013-10-02 DIAGNOSIS — I2510 Atherosclerotic heart disease of native coronary artery without angina pectoris: Secondary | ICD-10-CM | POA: Insufficient documentation

## 2013-10-02 DIAGNOSIS — M109 Gout, unspecified: Secondary | ICD-10-CM | POA: Insufficient documentation

## 2013-10-02 DIAGNOSIS — G4733 Obstructive sleep apnea (adult) (pediatric): Secondary | ICD-10-CM | POA: Insufficient documentation

## 2013-10-02 DIAGNOSIS — K219 Gastro-esophageal reflux disease without esophagitis: Secondary | ICD-10-CM | POA: Insufficient documentation

## 2013-10-02 DIAGNOSIS — Z79899 Other long term (current) drug therapy: Secondary | ICD-10-CM | POA: Insufficient documentation

## 2013-10-02 DIAGNOSIS — I10 Essential (primary) hypertension: Secondary | ICD-10-CM | POA: Insufficient documentation

## 2013-10-02 LAB — CBC WITH DIFFERENTIAL/PLATELET
Basophils Absolute: 0 10*3/uL (ref 0.0–0.1)
Basophils Relative: 0.4 % (ref 0.0–3.0)
EOS ABS: 0.1 10*3/uL (ref 0.0–0.7)
Eosinophils Relative: 1.9 % (ref 0.0–5.0)
HCT: 38 % — ABNORMAL LOW (ref 39.0–52.0)
Hemoglobin: 12.6 g/dL — ABNORMAL LOW (ref 13.0–17.0)
LYMPHS ABS: 2 10*3/uL (ref 0.7–4.0)
Lymphocytes Relative: 28.1 % (ref 12.0–46.0)
MCHC: 33.1 g/dL (ref 30.0–36.0)
MCV: 92.8 fl (ref 78.0–100.0)
MONO ABS: 0.7 10*3/uL (ref 0.1–1.0)
Monocytes Relative: 9.2 % (ref 3.0–12.0)
Neutro Abs: 4.4 10*3/uL (ref 1.4–7.7)
Neutrophils Relative %: 60.4 % (ref 43.0–77.0)
PLATELETS: 215 10*3/uL (ref 150.0–400.0)
RBC: 4.09 Mil/uL — ABNORMAL LOW (ref 4.22–5.81)
RDW: 14.2 % (ref 11.5–14.6)
WBC: 7.3 10*3/uL (ref 4.5–10.5)

## 2013-10-02 MED ORDER — OXYCODONE-ACETAMINOPHEN 10-325 MG PO TABS: 1.0000 | ORAL_TABLET | Freq: Three times a day (TID) | ORAL | Status: DC | PRN

## 2013-10-02 MED ORDER — AMITRIPTYLINE HCL 10 MG PO TABS: 10.0000 mg | ORAL_TABLET | Freq: Every day | ORAL | Status: DC

## 2013-10-02 MED ORDER — FINASTERIDE 5 MG PO TABS: ORAL_TABLET | ORAL | Status: DC

## 2013-10-02 MED ORDER — LOSARTAN POTASSIUM 50 MG PO TABS: ORAL_TABLET | ORAL | Status: DC

## 2013-10-02 NOTE — Progress Notes (Signed)
Subjective:    Patient ID: Tim [name], male    DOB: [birth year], 73 y.o.   MRN: [id number]  HPI [name] is a 73 year old male with history of hereditary peripheral neuropathy as well as lumbar neuropathy with bilateral foot pain. He denies any back pain at this time but reports that intermittent sharp shooting pain in his feet have worsened. This happens without warning about 4-5 times an hour during the day and lasts for a few seconds. He reports that pain is worse at nights and his wife has reported  him yelling and crying during his sleep. He is concerned about this situation and would like to discuss how to help decrease this pain.  Pain is worse with standing or walking therefore he is unable to exercise. Pressure from shoes causes discomfort and he walks barefoot at home   Pain Inventory Average Pain 9 Pain Right Now 0 My pain is intermittent, sharp and stabbing  In the last 24 hours, has pain interfered with the following? General activity 6 Relation with others 6 Enjoyment of life 6 What TIME of day is your pain at its worst? evening Sleep (in general) Fair  Pain is worse with: walking, bending, standing and some activites Pain improves with: rest, pacing activities, medication and injections Relief from Meds: 6  Mobility use a cane how many minutes can you walk? 5 ability to climb steps?  yes do you drive?  yes  Function retired I need assistance with the following:  household duties and shopping  Neuro/Psych weakness trouble walking  Prior Studies Any changes since last visit?  no  Physicians involved in your care Any changes since last visit?  no   Family History  Problem Relation Age of Onset  . Hypertension Mother   . Coronary artery disease Mother   . Heart attack Mother   . Pulmonary fibrosis Father     asbestosis   History   Social History  . Marital Status: Married    Spouse Name: [name]    Number of Children: 0  . Years of  Education: 20   Occupational History  . engineering     retired   Social History Main Topics  . Smoking status: Never Smoker   . Smokeless tobacco: Never Used  . Alcohol Use: No  . Drug Use: Yes    Special: Oxycodone  . Sexual Activity: Not Currently   Other Topics Concern  . None   Social History Narrative   HSG, [name] - BS, [name] - MS-engineering, 2 years on PhD - [name]. Married - '65 - 5yr/divorced; '76- 3 yrs/divorced; '92 . No children. 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 ACP/Living Will - Yes CPR; long-term Mechanical ventilation as long as he was able to cognate; ok for long term artificial nutrition. Precondition being able to cognate and not to have too much pain.    Past Surgical History  Procedure Laterality Date  . Hemorrhoid surgery    . Vitrectomy    . Intraocular lens insertion Bilateral   . Leg surgery Bilateral   . Knee surgery Right   . Traeculectomy Left     eye  . Shoulder arthroscopy  08/30/2011    Procedure: ARTHROSCOPY SHOULDER;  Surgeon: [name] MD;  [place] [facility]  Service: Orthopedics;  Laterality: Right;  Right Shoulder Arthroscopy, Debridement, and Decompression  . Tonsillectomy    . Colonoscopy    . Cholecystectomy    .  Toe amputation    . Colonoscopy [name] 10/30/2012    Procedure: COLONOSCOPY;  Surgeon: [name], MD;  [facility];  Service: Endoscopy;  Laterality: [name];  . Cardiac catheterization  1997    Non-obstructive disease  . Cardiac catheterization  12/24/2012    mild < 20% LCx, prox 30% RCA; LVEF 55-65% , moderate pulmonary HTN, moderate AS  . Eye surgery Bilateral cataract  . 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 HYPERCHOLESTEROLEMIA-PURE   . GOUT   . OBSTRUCTIVE SLEEP  APNEA   . Essential hypertension, benign   . VENOUS INSUFFICIENCY   . ASTHMA   . COPD   . VENTRAL HERNIA   . OSTEOARTHRITIS   . CATARACT, HX OF   . PULMONARY HYPERTENSION, HX OF   . Hallux valgus (acquired)   . Obesity, unspecified   . Osteoarthrosis, unspecified whether generalized or localized, unspecified site   . Unspecified glaucoma   . Moderate aortic stenosis     Per 12/2012 echo- (VTI 1.08 cm^2, Vmax 1.11 cm ^2), mild LA dilatation  . Paroxysmal atrial fibrillation 2003  . Lumbosacral neuritis   . Unspecified hereditary and idiopathic peripheral neuropathy   . Lumbosacral spondylosis   . CAD (coronary artery disease)     Nonobstructive by 12/2012 cath  . PUD (peptic ulcer disease)   . BPH (benign prostatic hypertrophy)   . HH (hiatus hernia)   . Glaucoma   . GERD (gastroesophageal reflux disease)   . Personal history of colonic adenoma 10/30/2012  . Shortness of breath     with exertion   BP 129/44  Pulse 58  Resp 14  Ht 6' 1"  (1.854 m)  Wt 358 lb 12.8 oz (162.751 kg)  BMI 47.35 kg/m2  SpO2 96%  Opioid Risk Score:   Fall Risk Score:  (educated and handout on fall prevention in the home given at previous appt) Review of Systems  Constitutional: Positive for fever, chills, diaphoresis and unexpected weight change.  Respiratory: Positive for apnea and shortness of breath.   Gastrointestinal: Positive for diarrhea.  Genitourinary: Positive for dysuria.  Musculoskeletal: Positive for gait problem.  All other systems reviewed and are negative.       Objective:   Physical Exam  Nursing note and vitals reviewed. Constitutional: He appears well-developed and well-nourished.  Morbidly obese  HENT:  Head: Normocephalic and atraumatic.  Eyes: Conjunctivae are normal. Pupils are equal, round, and reactive to light.  Neck: Normal range of motion. Neck supple.  Cardiovascular: Normal rate and regular rhythm.   Pulmonary/Chest: Effort normal and breath sounds  normal.  Abdominal: Soft. Bowel sounds are normal.  Musculoskeletal:  reveals 5/5 muscle strength of the upper extremity, and 5/5 of the lower extremity. Full range of motion in upper and lower extremities. ROM of spine is restricted.   Spine and paraspinals non-tender to touch. No clonus noted.    He  arises from chair with  difficulty and walks with wide based rolling gait. Uses cane for AD.      Neurological: He is alert.  Skin: Skin is warm and dry.  Psychiatric: His behavior is normal. Judgment normal. His mood appears anxious. Cognition and memory are normal.          Assessment & Plan:  1. 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 We discussed pressure relief wearing some type of shoes at home to decrease pressure on his feet and discussed consulting a prosthetist for input on good shoes with soft toe box.  Added low dose elavil to help with symptoms. Also recommended trial of  Capsicin creme to feet bid to tid. He is to continue current dose neurontin. He remembers using lyrica in the past but could recall details about it.  Refilled: Percocet 10/325 mg # 75 pills---use one every 8 hours as needed for pain.   2. DDD lumbar spine with LBP: Stable at this time. Will discuss exercise and activity level once pain in foot improved. Continue current pain regimen.

## 2013-10-02 NOTE — Patient Instructions (Addendum)
Zostrix cream(capscain) apply to feet twice a day   Try shoes with soft/microfiber top to decrease pressure on feet---[employer] or [employer]

## 2013-10-07 ENCOUNTER — Ambulatory Visit: Payer: Medicare Other | Admitting: Physical Therapy

## 2013-10-28 ENCOUNTER — Encounter: Payer: Self-pay | Admitting: Family Medicine

## 2013-11-04 ENCOUNTER — Encounter: Payer: Self-pay | Admitting: Registered Nurse

## 2013-11-04 ENCOUNTER — Encounter: Payer: Medicare Other | Attending: Registered Nurse | Admitting: Registered Nurse

## 2013-11-04 ENCOUNTER — Other Ambulatory Visit: Payer: Self-pay | Admitting: Physical Medicine & Rehabilitation

## 2013-11-04 VITALS — BP 136/64 | HR 64 | Resp 16 | Ht 73.0 in | Wt 358.0 lb

## 2013-11-04 DIAGNOSIS — G894 Chronic pain syndrome: Secondary | ICD-10-CM

## 2013-11-04 DIAGNOSIS — G608 Other hereditary and idiopathic neuropathies: Secondary | ICD-10-CM

## 2013-11-04 DIAGNOSIS — Z5181 Encounter for therapeutic drug level monitoring: Secondary | ICD-10-CM | POA: Insufficient documentation

## 2013-11-04 DIAGNOSIS — Z79899 Other long term (current) drug therapy: Secondary | ICD-10-CM

## 2013-11-04 DIAGNOSIS — G8929 Other chronic pain: Secondary | ICD-10-CM | POA: Insufficient documentation

## 2013-11-04 MED ORDER — OXYCODONE-ACETAMINOPHEN 10-325 MG PO TABS: 1.0000 | ORAL_TABLET | Freq: Three times a day (TID) | ORAL | Status: DC | PRN

## 2013-11-04 MED ORDER — GABAPENTIN 600 MG PO TABS: 600.0000 mg | ORAL_TABLET | Freq: Three times a day (TID) | ORAL | Status: DC

## 2013-11-04 MED ORDER — AMITRIPTYLINE HCL 25 MG PO TABS: 25.0000 mg | ORAL_TABLET | Freq: Every day | ORAL | Status: DC

## 2013-11-04 NOTE — Progress Notes (Signed)
Subjective:    Patient ID: Tim [name], male    DOB: Jan 11, 1941, 73 y.o.   MRN: [id number]  HPI:  Tim [name] is a 73 year old male who returns for follow up for chronic pain and medication refill.He says his pain is located in his left foot and at times lower back. He still having the shooting pains during the night, he says they are intermittent, but he did experience it last night. He still having episodes of awakening in the middle of night crying out for relief.He rates his pain 1. His current exercise regime is walking in the house and performing stretching exercises daily.  Last week Thursday on October 30, 2013 he fell on the floor. He doesn't quite remember all the details. He felt like the room was still and he was falling. He went to Orthopedic practice in [place] [facility] [place] on [address]. He is wearing a left knee brace and he received a cortisone injection. He will be following up with Dr. Sharol Given next week. Educated on falss prevention and he verbalizes understanding.   Pain Inventory Average Pain 6 Pain Right Now 1 My pain is sharp and stabbing  In the last 24 hours, has pain interfered with the following? General activity 6 Relation with others 6 Enjoyment of life 6 What TIME of day is your pain at its worst? evening Sleep (in general) Fair  Pain is worse with: walking, bending, standing and some activites Pain improves with: rest, pacing activities, medication and injections Relief from Meds: 6  Mobility use a cane how many minutes can you walk? 5 ability to climb steps?  yes do you drive?  yes  Function retired I need assistance with the following:  household duties and shopping  Neuro/Psych weakness trouble walking  Prior Studies Any changes since last visit?  no  Physicians involved in your care Any changes since last visit?  no   Family History  Problem Relation Age of Onset  . Hypertension Mother   . Coronary artery disease Mother   . Heart attack  Mother   . Pulmonary fibrosis Father     asbestosis   History   Social History  . Marital Status: Married    Spouse Name: [name]    Number of Children: 0  . Years of Education: 20   Occupational History  . engineering     retired   Social History Main Topics  . Smoking status: Never Smoker   . Smokeless tobacco: Never Used  . Alcohol Use: No  . Drug Use: Yes    Special: Oxycodone  . Sexual Activity: Not Currently   Other Topics Concern  . None   Social History Narrative   HSG, [name] - BS, [name] - MS-engineering, 2 years on PhD - [name]. Married - '65 - 7yr/divorced; '76- 3 yrs/divorced; '92 . No children. 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 ACP/Living Will - Yes CPR; long-term Mechanical ventilation as long as he was able to cognate; ok for long term artificial nutrition. Precondition being able to cognate and not to have too much pain.    Past Surgical History  Procedure Laterality Date  . Hemorrhoid surgery    . Vitrectomy    . Intraocular lens insertion Bilateral   . Leg surgery Bilateral   . Knee surgery Right   . Traeculectomy Left     eye  . Shoulder arthroscopy  08/30/2011    Procedure: ARTHROSCOPY SHOULDER;  Surgeon: [name], MD;  Location: [place];  Service: Orthopedics;  Laterality: Right;  Right Shoulder Arthroscopy, Debridement, and Decompression  . Tonsillectomy    . Colonoscopy    . Cholecystectomy    . Toe amputation    . Colonoscopy [name] 10/30/2012    Procedure: COLONOSCOPY;  Surgeon: [name], MD;  [facility];  Service: Endoscopy;  Laterality: [name];  . Cardiac catheterization  1997    Non-obstructive disease  . Cardiac catheterization  12/24/2012    mild < 20% LCx, prox 30% RCA; LVEF 55-65% , moderate pulmonary HTN, moderate AS  . Eye surgery Bilateral cataract  . 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 HYPERCHOLESTEROLEMIA-PURE   . GOUT   . OBSTRUCTIVE SLEEP APNEA   . Essential hypertension, benign   . VENOUS INSUFFICIENCY   . ASTHMA   . COPD   . VENTRAL HERNIA   . OSTEOARTHRITIS   . CATARACT, HX OF   . PULMONARY HYPERTENSION, HX OF   . Hallux valgus (acquired)   . Obesity, unspecified   . Osteoarthrosis, unspecified whether generalized or localized, unspecified site   . Unspecified glaucoma   . Moderate aortic stenosis     Per 12/2012 echo- (VTI 1.08 cm^2, Vmax 1.11 cm ^2), mild LA dilatation  . Paroxysmal atrial fibrillation 2003  . Lumbosacral neuritis   . Unspecified hereditary and idiopathic peripheral neuropathy   . Lumbosacral spondylosis   . CAD (coronary artery disease)     Nonobstructive by 12/2012 cath  . PUD (peptic ulcer disease)   . BPH (benign prostatic hypertrophy)   . HH (hiatus hernia)   . Glaucoma   . GERD (gastroesophageal reflux disease)   . Personal history of colonic adenoma 10/30/2012  . Shortness of breath     with exertion  . Peripheral neuropathy     Heredetary; with chronic neuropathic pain   BP 136/64  Pulse 64  Resp 16  Ht 6' 1"  (1.854 m)  Wt 358 lb (162.388 kg)  BMI 47.24 kg/m2  SpO2 98%  Opioid Risk Score:   Fall Risk Score: High Fall Risk (>13 points) (patient educated handout declined)   Review of Systems  Musculoskeletal: Positive for gait problem.  Neurological: Positive for weakness.  All other systems reviewed and are negative.      Objective:   Physical Exam  Nursing note and vitals reviewed. Constitutional: He is oriented to person, place, and time. He appears well-developed and well-nourished.  Obese  HENT:  Head: Normocephalic and atraumatic.  Neck: Normal range of motion. Neck supple.  Cardiovascular: Normal rate, regular rhythm and normal heart sounds.   Pulmonary/Chest: Effort normal and  breath sounds normal.  Musculoskeletal:  Normal Muscle Bulk: Muscle testing Reveals: Upper Extremities: Muscle Testing 5/5. Right arm decreased ROM 45 degrees. Left Arem Full ROM Back without spinal or paraspinal tenderness. Lower Extremities: Full ROM  Right Lower Extremity. Left Lower Extremity with knee brace intact. Decreased ROM. Left Leg flexion produces pain to the knee. Walks with a cane. Narrow gait. Arises from chair with ease.  Neurological: He is alert and oriented to person, place, and time.  Skin: Skin is warm and dry.  Psychiatric: He has a normal mood and affect.  Assessment & Plan:  1. 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 Will Continue to Monitor. Refilled: Percocet 10/325 mg # 75 pills---use one every 8 hours as needed for pain.  2. DDD lumbar spine with LBP: Stable at this time.  Continue current pain regimen.    20 Minutes of face to face patient care time was spent during this visit. All questions were encouraged and answered.  F/U in 1 month

## 2013-11-05 ENCOUNTER — Other Ambulatory Visit: Payer: Self-pay

## 2013-11-05 ENCOUNTER — Other Ambulatory Visit: Payer: Self-pay | Admitting: Cardiology

## 2013-11-05 MED ORDER — APIXABAN 5 MG PO TABS: 5.0000 mg | ORAL_TABLET | Freq: Two times a day (BID) | ORAL | Status: DC

## 2013-11-05 MED ORDER — METOPROLOL SUCCINATE ER 200 MG PO TB24: ORAL_TABLET | ORAL | Status: DC

## 2013-11-05 MED ORDER — POTASSIUM CHLORIDE CRYS ER 20 MEQ PO TBCR: 20.0000 meq | EXTENDED_RELEASE_TABLET | Freq: Every day | ORAL | Status: DC

## 2013-11-10 NOTE — Progress Notes (Signed)
Urine drug screen from 11/04/2013 was consistent.

## 2013-11-14 ENCOUNTER — Other Ambulatory Visit: Payer: Self-pay

## 2013-11-14 MED ORDER — ROSUVASTATIN CALCIUM 40 MG PO TABS: 40.0000 mg | ORAL_TABLET | Freq: Every day | ORAL | Status: DC

## 2013-11-20 ENCOUNTER — Other Ambulatory Visit: Payer: Self-pay | Admitting: Family Medicine

## 2013-11-20 MED ORDER — ALLOPURINOL 300 MG PO TABS: ORAL_TABLET | ORAL | Status: DC

## 2013-12-04 ENCOUNTER — Encounter: Payer: Medicare Other | Attending: Registered Nurse | Admitting: Registered Nurse

## 2013-12-04 ENCOUNTER — Encounter: Payer: Self-pay | Admitting: Registered Nurse

## 2013-12-04 VITALS — BP 145/67 | HR 88 | Resp 14 | Ht 73.0 in | Wt 362.0 lb

## 2013-12-04 DIAGNOSIS — G608 Other hereditary and idiopathic neuropathies: Secondary | ICD-10-CM

## 2013-12-04 DIAGNOSIS — G8929 Other chronic pain: Secondary | ICD-10-CM

## 2013-12-04 DIAGNOSIS — G894 Chronic pain syndrome: Secondary | ICD-10-CM

## 2013-12-04 DIAGNOSIS — Z5181 Encounter for therapeutic drug level monitoring: Secondary | ICD-10-CM | POA: Insufficient documentation

## 2013-12-04 DIAGNOSIS — Z79899 Other long term (current) drug therapy: Secondary | ICD-10-CM | POA: Insufficient documentation

## 2013-12-04 MED ORDER — TRAMADOL-ACETAMINOPHEN 37.5-325 MG PO TABS: ORAL_TABLET | ORAL | Status: DC

## 2013-12-04 MED ORDER — OXYCODONE-ACETAMINOPHEN 10-325 MG PO TABS: 1.0000 | ORAL_TABLET | Freq: Three times a day (TID) | ORAL | Status: DC | PRN

## 2013-12-04 NOTE — Progress Notes (Signed)
Subjective:    Patient ID: Tim [name], male    DOB: 1940/12/24, 73 y.o.   MRN: [id number]  HPI: Tim [name] is a 73 year old male who returns for follow up for chronic pain and medication refill. He says his pain is located in his lower back and left foot. He rates his pain 7. He doesn't follow any exercise routine at this time. He says he just received his card for silver sneakers he will start at the gym 2-3 times a week.  He had a bout of excruciating pain for 5 days, he does feel better today.   Pain Inventory Average Pain 7 Pain Right Now 7 My pain is intermittent, sharp, stabbing and aching  In the last 24 hours, has pain interfered with the following? General activity 4 Relation with others 7 Enjoyment of life 3 What TIME of day is your pain at its worst? evening Sleep (in general) Fair  Pain is worse with: walking, bending and standing Pain improves with: rest, pacing activities, medication and injections Relief from Meds: 7  Mobility walk with assistance use a cane how many minutes can you walk? 5 ability to climb steps?  no do you drive?  yes Do you have any goals in this area?  no  Function not employed: date last employed 2004 retired I need assistance with the following:  household duties and shopping Do you have any goals in this area?  no  Neuro/Psych bladder control problems weakness  Prior Studies Any changes since last visit?  no  Physicians involved in your care Any changes since last visit?  no   Family History  Problem Relation Age of Onset  . Hypertension Mother   . Coronary artery disease Mother   . Heart attack Mother   . Pulmonary fibrosis Father     asbestosis   History   Social History  . Marital Status: Married    Spouse Name: [name]    Number of Children: 0  . Years of Education: 20   Occupational History  . engineering     retired   Social History Main Topics  . Smoking status: Never Smoker   . Smokeless  tobacco: Never Used  . Alcohol Use: No  . Drug Use: Yes    Special: Oxycodone  . Sexual Activity: Not Currently   Other Topics Concern  . None   Social History Narrative   HSG, [name] - BS, [name] - MS-engineering, 2 years on PhD - [name]. Married - '65 - 72yr/divorced; '76- 3 yrs/divorced; '92 . No children. 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 ACP/Living Will - Yes CPR; long-term Mechanical ventilation as long as he was able to cognate; ok for long term artificial nutrition. Precondition being able to cognate and not to have too much pain.    Past Surgical History  Procedure Laterality Date  . Hemorrhoid surgery    . Vitrectomy    . Intraocular lens insertion Bilateral   . Leg surgery Bilateral   . Knee surgery Right   . Traeculectomy Left     eye  . Shoulder arthroscopy  08/30/2011    Procedure: ARTHROSCOPY SHOULDER;  Surgeon: [name] MD;  [place] [facility]  Service: Orthopedics;  Laterality: Right;  Right Shoulder Arthroscopy, Debridement, and Decompression  . Tonsillectomy    . Colonoscopy    . Cholecystectomy    . Toe amputation    . Colonoscopy [name] 10/30/2012  Procedure: COLONOSCOPY;  Surgeon: [name], MD;  [facility];  Service: Endoscopy;  Laterality: [name];  . Cardiac catheterization  1997    Non-obstructive disease  . Cardiac catheterization  12/24/2012    mild < 20% LCx, prox 30% RCA; LVEF 55-65% , moderate pulmonary HTN, moderate AS  . Eye surgery Bilateral cataract  . Amputation Left 04/11/2013    Procedure: AMPUTATION DIGIT Left 3rd toe;  Surgeon: [name], MD;  Location: [place];  Service: Orthopedics;  Laterality: Left;  Left 3rd toe amputation at [facility]   Past Medical History  Diagnosis Date  . HYPERCHOLESTEROLEMIA-PURE   . GOUT   . OBSTRUCTIVE SLEEP APNEA   . Essential hypertension, benign   . VENOUS INSUFFICIENCY   . ASTHMA   . COPD   . VENTRAL HERNIA   . OSTEOARTHRITIS   . CATARACT, HX  OF   . PULMONARY HYPERTENSION, HX OF   . Hallux valgus (acquired)   . Obesity, unspecified   . Osteoarthrosis, unspecified whether generalized or localized, unspecified site   . Unspecified glaucoma   . Moderate aortic stenosis     Per 12/2012 echo- (VTI 1.08 cm^2, Vmax 1.11 cm ^2), mild LA dilatation  . Paroxysmal atrial fibrillation 2003  . Lumbosacral neuritis   . Unspecified hereditary and idiopathic peripheral neuropathy   . Lumbosacral spondylosis   . CAD (coronary artery disease)     Nonobstructive by 12/2012 cath  . PUD (peptic ulcer disease)   . BPH (benign prostatic hypertrophy)   . HH (hiatus hernia)   . Glaucoma   . GERD (gastroesophageal reflux disease)   . Personal history of colonic adenoma 10/30/2012  . Shortness of breath     with exertion  . Peripheral neuropathy     Heredetary; with chronic neuropathic pain   BP 145/67  Pulse 88  Resp 14  Ht 6' 1"  (1.854 m)  Wt 362 lb (164.202 kg)  BMI 47.77 kg/m2  SpO2 98%  Opioid Risk Score:   Fall Risk Score: Moderate Fall Risk (6-13 points) (pt educated on fall risk, brochure given to pt previously)   Review of Systems  Constitutional: Positive for fever and chills.  Respiratory: Positive for apnea and shortness of breath.   Gastrointestinal: Positive for diarrhea and constipation.  Genitourinary:       Bladder control problems  Neurological: Positive for weakness.  Hematological: Bruises/bleeds easily.  All other systems reviewed and are negative.      Objective:   Physical Exam  Nursing note and vitals reviewed. Constitutional: He is oriented to person, place, and time. He appears well-developed and well-nourished.  HENT:  Head: Normocephalic and atraumatic.  Neck: Normal range of motion. Neck supple.  Cardiovascular: Normal rate and regular rhythm.   Pulmonary/Chest: Effort normal and breath sounds normal.  Musculoskeletal:  Normal Muscle Bulk and Muscle testing Reveals: Upper extremities: Right Arm  with decreased ROM 100 degrees and Muscle strength 5/5. Left Arm Full ROM and Muscle Strength 5/5. Back without spinal or paraspinal tenderness Lower Extremities: Full ROM and Muscle strength 5/5 Arises from chair with ease. Narrow Based Gait uses straight cane for support.   Neurological: He is alert and oriented to person, place, and time.  Skin: Skin is warm and dry.  Psychiatric: He has a normal mood and affect.          Assessment & Plan:  1. 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 Will Continue to Monitor.  Refilled: Percocet 10/325 mg # 75 pills---use one every 8 hours as needed for pain.  And Tramadol refilled 37.5/325mg one tablet every 6 hours as needed #120. Continue with Gabapentin 2. DDD lumbar spine with LBP: Stable at this time. Continue current pain regimen. Encourage increase activity and heat therapy. Start Silver Social research officer, government at [employer].  20 minutes of face to face patient care time was spent during this visit. All questions were encouraged and answered.  F/U in 1 month

## 2013-12-11 ENCOUNTER — Other Ambulatory Visit: Payer: Self-pay | Admitting: Cardiology

## 2013-12-18 ENCOUNTER — Other Ambulatory Visit: Payer: Self-pay

## 2013-12-18 MED ORDER — EZETIMIBE 10 MG PO TABS: ORAL_TABLET | ORAL | Status: DC

## 2014-01-01 ENCOUNTER — Encounter: Payer: Self-pay | Admitting: Registered Nurse

## 2014-01-01 ENCOUNTER — Encounter: Payer: Medicare Other | Attending: Registered Nurse | Admitting: Registered Nurse

## 2014-01-01 VITALS — BP 169/66 | HR 95 | Resp 14 | Ht 74.0 in | Wt 364.0 lb

## 2014-01-01 DIAGNOSIS — Z79899 Other long term (current) drug therapy: Secondary | ICD-10-CM | POA: Diagnosis present

## 2014-01-01 DIAGNOSIS — G8929 Other chronic pain: Secondary | ICD-10-CM

## 2014-01-01 DIAGNOSIS — G894 Chronic pain syndrome: Secondary | ICD-10-CM

## 2014-01-01 DIAGNOSIS — Z5181 Encounter for therapeutic drug level monitoring: Secondary | ICD-10-CM | POA: Diagnosis not present

## 2014-01-01 DIAGNOSIS — G608 Other hereditary and idiopathic neuropathies: Secondary | ICD-10-CM

## 2014-01-01 MED ORDER — AMITRIPTYLINE HCL 25 MG PO TABS: 25.0000 mg | ORAL_TABLET | Freq: Every day | ORAL | Status: DC

## 2014-01-01 MED ORDER — OXYCODONE-ACETAMINOPHEN 10-325 MG PO TABS: 1.0000 | ORAL_TABLET | Freq: Three times a day (TID) | ORAL | Status: DC | PRN

## 2014-01-01 NOTE — Progress Notes (Signed)
Subjective:    Patient ID: Tim [name], male    DOB: 13-Oct-1940, 73 y.o.   MRN: [id number]  HPI:Tim [name] is a 73 year old male who returns for follow up for chronic pain and medication refill. He says his pain is located in his lower back and left foot. He rates his pain 8. His current exercise regime is walking to the mailbox daily. He wasn't able to start the silver sneakers due to intensity of pain and his stamina. He has been encouraged to start chair exercises and this has been demonstrated he verbalizes understanding. He states he had two falls since last month. The first occurrences he doesn't recall the event. Last week he was sitting on a bench at church, he was getting up and lost his balance or footing and landed on his knees. He didn't seek medical attention.       Pain Inventory Average Pain 3 Pain Right Now 8 My pain is sharp, dull and stabbing  In the last 24 hours, has pain interfered with the following? General activity 8 Relation with others 8 Enjoyment of life 6 What TIME of day is your pain at its worst? evening, night Sleep (in general) Fair  Pain is worse with: walking, bending, standing and some activites Pain improves with: rest, pacing activities, medication and injections Relief from Meds: 6  Mobility walk with assistance use a cane how many minutes can you walk? 4 ability to climb steps?  yes do you drive?  yes  Function not employed: date last employed 2003 I need assistance with the following:  meal prep, household duties and shopping  Neuro/Psych bowel control problems weakness numbness tremor trouble walking  Prior Studies Any changes since last visit?  no  Physicians involved in your care Any changes since last visit?  no   Family History  Problem Relation Age of Onset  . Hypertension Mother   . Coronary artery disease Mother   . Heart attack Mother   . Pulmonary fibrosis Father     asbestosis   History    Social History  . Marital Status: Married    Spouse Name: [name]    Number of Children: 0  . Years of Education: 20   Occupational History  . engineering     retired   Social History Main Topics  . Smoking status: Never Smoker   . Smokeless tobacco: Never Used  . Alcohol Use: No  . Drug Use: Yes    Special: Oxycodone  . Sexual Activity: Not Currently   Other Topics Concern  . None   Social History Narrative   HSG, [name] - BS, [name] - MS-engineering, 2 years on PhD - [name]. Married - '65 - 23yr/divorced; '76- 3 yrs/divorced; '92 . No children. 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 ACP/Living Will - Yes CPR; long-term Mechanical ventilation as long as he was able to cognate; ok for long term artificial nutrition. Precondition being able to cognate and not to have too much pain.    Past Surgical History  Procedure Laterality Date  . Hemorrhoid surgery    . Vitrectomy    . Intraocular lens insertion Bilateral   . Leg surgery Bilateral   . Knee surgery Right   . Traeculectomy Left     eye  . Shoulder arthroscopy  08/30/2011    Procedure: ARTHROSCOPY SHOULDER;  Surgeon: [name] MD;  [place] [facility]  Service: Orthopedics;  Laterality:  Right;  Right Shoulder Arthroscopy, Debridement, and Decompression  . Tonsillectomy    . Colonoscopy    . Cholecystectomy    . Toe amputation    . Colonoscopy [name] 10/30/2012    Procedure: COLONOSCOPY;  Surgeon: [name], MD;  [facility];  Service: Endoscopy;  Laterality: [name];  . Cardiac catheterization  1997    Non-obstructive disease  . Cardiac catheterization  12/24/2012    mild < 20% LCx, prox 30% RCA; LVEF 55-65% , moderate pulmonary HTN, moderate AS  . Eye surgery Bilateral cataract  . 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 HYPERCHOLESTEROLEMIA-PURE   . GOUT   . OBSTRUCTIVE SLEEP APNEA   . Essential hypertension, benign   . VENOUS INSUFFICIENCY   . ASTHMA   . COPD   . VENTRAL HERNIA   . OSTEOARTHRITIS   . CATARACT, HX OF   . PULMONARY HYPERTENSION, HX OF   . Hallux valgus (acquired)   . Obesity, unspecified   . Osteoarthrosis, unspecified whether generalized or localized, unspecified site   . Unspecified glaucoma   . Moderate aortic stenosis     Per 12/2012 echo- (VTI 1.08 cm^2, Vmax 1.11 cm ^2), mild LA dilatation  . Paroxysmal atrial fibrillation 2003  . Lumbosacral neuritis   . Unspecified hereditary and idiopathic peripheral neuropathy   . Lumbosacral spondylosis   . CAD (coronary artery disease)     Nonobstructive by 12/2012 cath  . PUD (peptic ulcer disease)   . BPH (benign prostatic hypertrophy)   . HH (hiatus hernia)   . Glaucoma   . GERD (gastroesophageal reflux disease)   . Personal history of colonic adenoma 10/30/2012  . Shortness of breath     with exertion  . Peripheral neuropathy     Heredetary; with chronic neuropathic pain   BP 169/66  Pulse 95  Resp 14  Ht 6' 2"  (1.88 m)  Wt 364 lb (165.109 kg)  BMI 46.71 kg/m2  SpO2 95%  Opioid Risk Score:   Fall Risk Score: High Fall Risk (>13 points) (pt educated on fall risk, brochure given to pt previously)   Review of Systems  Constitutional: Positive for unexpected weight change.  Respiratory: Positive for shortness of breath.   Genitourinary:       Bladder control problems  Musculoskeletal: Positive for back pain and gait problem.  Skin: Positive for rash.  Neurological: Positive for tremors, weakness and numbness.  All other systems reviewed and are negative.      Objective:   Physical Exam  Nursing note and vitals reviewed. Constitutional: He is oriented to person, place, and time. He appears well-developed and well-nourished.  Obese  HENT:  Head: Normocephalic and  atraumatic.  Neck: Normal range of motion. Neck supple.  Cardiovascular: Normal rate and regular rhythm.   Pulmonary/Chest: Effort normal and breath sounds normal.  Musculoskeletal:  Normal Muscle Bulk and Muscle Testing Reveals: Upper extremities: Left Full ROM and Muscle Strength 5/5 Right Arm Decreased ROM 90 degrees Muscle Strength 5/5. Back without spinal or paraspinal tenderness noted Lower extremities: Full ROM and Muscle strength 5/5 Arises from Chair with ease Narrow Based gait Uses Straight cane for support   Neurological: He is alert and oriented to person, place, and time.  Skin: Skin is warm  and dry.  Psychiatric: He has a normal mood and affect.          Assessment & Plan:  1. 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 Will Continue to Monitor.  Refilled: Percocet 10/325 mg # 75 pills---use one every 8 hours as needed for pain and Tramadol  37.5/325mg one tablet every 6 hours as needed #120. Continue with Gabapentin. 2. DDD lumbar spine with LBP: Stable at this time. Continue current pain regimen. Encourage increase activity and heat therapy.  3. Insomnia: Elavil increased to 75 mg HS, trial period for a week. He was instructed to call if he was able to tolerate. Also educated on adverse reactions an was instructed to call. He verbalizes understanding.  20 minutes of face to face patient care time was spent during this visit. All questions were encouraged and answered.  F/U in 1 month

## 2014-01-01 NOTE — Patient Instructions (Signed)
Patient has been taking amitriptyline 25 mg tablets (2) for insomnia as discussed last month. Not effective. He will try three tablets at bedtime. He was instructed to call with any adverse reaction.

## 2014-01-05 ENCOUNTER — Telehealth: Payer: Self-pay | Admitting: Cardiology

## 2014-01-05 DIAGNOSIS — I359 Nonrheumatic aortic valve disorder, unspecified: Secondary | ICD-10-CM

## 2014-01-05 NOTE — Telephone Encounter (Signed)
Spoke with pt, for the last 3 weeks he has noticed increased in SOB. He reports he is not able to walk the same distance as before d/t SOB and leg pain. He reports it seems to be getting worse. He is concerned this maybe related to his aortic valve. Pt scheduled for echo and then follow up to discuss with dr Stanford Breed. Pt agreed with this plan.

## 2014-01-05 NOTE — Telephone Encounter (Signed)
New problem    Pt need to speak to nurse concerning his heart valve being replaced and he isn't feeling well. Please call pt.

## 2014-01-05 NOTE — Telephone Encounter (Signed)
New problem   Pt need to speak to nurse about his heart valve being replaced. Pt not feeling well. Please call pt.

## 2014-01-06 ENCOUNTER — Other Ambulatory Visit: Payer: Self-pay | Admitting: Cardiology

## 2014-01-06 ENCOUNTER — Telehealth: Payer: Self-pay | Admitting: Family Medicine

## 2014-01-06 MED ORDER — APIXABAN 5 MG PO TABS: 5.0000 mg | ORAL_TABLET | Freq: Two times a day (BID) | ORAL | Status: DC

## 2014-01-06 NOTE — Telephone Encounter (Signed)
Eliquis RF sent to pharmacy.

## 2014-01-06 NOTE — Telephone Encounter (Signed)
Patient requesting eliquis.  Patient only seen for acute visit.  Please advise refill.

## 2014-01-07 ENCOUNTER — Ambulatory Visit (HOSPITAL_COMMUNITY)
Admission: RE | Admit: 2014-01-07 | Discharge: 2014-01-07 | Disposition: A | Discharge status: Home / Self Care | Payer: Medicare Other | Source: Ambulatory Visit | Attending: Cardiology | Admitting: Cardiology

## 2014-01-07 DIAGNOSIS — I359 Nonrheumatic aortic valve disorder, unspecified: Secondary | ICD-10-CM | POA: Insufficient documentation

## 2014-01-07 NOTE — Progress Notes (Signed)
2D Echo Performed 01/07/2014    Marygrace Drought, RCS

## 2014-01-14 ENCOUNTER — Encounter: Payer: Self-pay | Admitting: Cardiology

## 2014-01-14 ENCOUNTER — Ambulatory Visit (INDEPENDENT_AMBULATORY_CARE_PROVIDER_SITE_OTHER): Payer: Medicare Other | Admitting: Cardiology

## 2014-01-14 VITALS — BP 130/84 | HR 79 | Wt 364.0 lb

## 2014-01-14 DIAGNOSIS — I2510 Atherosclerotic heart disease of native coronary artery without angina pectoris: Secondary | ICD-10-CM

## 2014-01-14 DIAGNOSIS — R0609 Other forms of dyspnea: Secondary | ICD-10-CM

## 2014-01-14 DIAGNOSIS — R0600 Dyspnea, unspecified: Secondary | ICD-10-CM

## 2014-01-14 DIAGNOSIS — R0989 Other specified symptoms and signs involving the circulatory and respiratory systems: Secondary | ICD-10-CM

## 2014-01-14 MED ORDER — FUROSEMIDE 20 MG PO TABS: ORAL_TABLET | ORAL | Status: DC

## 2014-01-14 NOTE — Progress Notes (Signed)
HPI: FU paroxysmal atrial fibrillation, sleep apnea, and pedal edema. Admitted with CP June of 2014 and ruled out. Carotid Dopplers in June of 2014 showed no significant stenosis. Cardiac catheterization in June of 2014 showed a pulmonary artery pressure of 51/26 and pulmonary capillary wedge pressure of 20. Aortic valve area of 1.4 cm. There was a 20% circumflex, 30% right coronary artery. There was no disease in the LAD. Ejection fraction 55-65%. Medical therapy recommended. Echocardiogram performed in June 2014 showed moderate aortic stenosis with a mean gradient of 27 mm of mercury. There was mild left atrial enlargement and trace mitral regurgitation. There is no mention in the report of LV function. Echocardiogram repeated in July 2015. Ejection fraction normal. Grade 1 diastolic dysfunction. Moderate aortic stenosis with a mean gradient of 28 mm of mercury. Trace aortic insufficiency. Mild biatrial enlargement and mild right ventricular enlargement. Since he was last seen He describes increased dyspnea on exertion for 1 month. No orthopnea, PND or increased pedal edema. No chest pain. He has fallen 3 times but denies syncope.   Current Outpatient Prescriptions  Medication Sig Dispense Refill  . albuterol (PROVENTIL HFA;VENTOLIN HFA) 108 (90 BASE) MCG/ACT inhaler Inhale 2 puffs into the lungs 4 (four) times daily as needed for wheezing or shortness of breath.      . allopurinol (ZYLOPRIM) 300 MG tablet take 1 tablet by mouth once daily  30 tablet  3  . amitriptyline (ELAVIL) 75 MG tablet Take 75 mg by mouth at bedtime.      [name] apixaban (ELIQUIS) 5 MG TABS tablet Take 1 tablet (5 mg total) by mouth 2 (two) times daily.  60 tablet  6  . clotrimazole-betamethasone (LOTRISONE) cream Apply 1 application topically 2 (two) times daily.  45 g  3  . finasteride (PROSCAR) 5 MG tablet take 1 tablet by mouth once daily  90 tablet  1  . fluconazole (DIFLUCAN) 100 MG tablet       .  Fluticasone-Salmeterol (ADVAIR) 250-50 MCG/DOSE AEPB Inhale 1 puff into the lungs daily as needed (for nasal congestion).       . gabapentin (NEURONTIN) 600 MG tablet Take 1 tablet (600 mg total) by mouth 4 (four) times daily - after meals and at bedtime.  120 tablet  2  . losartan (COZAAR) 50 MG tablet take 1 tablet by mouth twice a day  180 tablet  1  . metoprolol (TOPROL-XL) 200 MG 24 hr tablet take 2 tablets by mouth once daily  60 tablet  3  . Multiple Vitamin (MULTIVITAMIN WITH MINERALS) TABS Take 1 tablet by mouth daily.      . niacin (NIASPAN) 1000 MG CR tablet Take 1 tablet (1,000 mg total) by mouth 2 (two) times daily.  60 tablet  3  . Niacin CR 1000 MG TBCR take 1 tablet by mouth twice a day  15 tablet  0  . omeprazole (PRILOSEC) 10 MG capsule take 1 capsule by mouth once daily  30 capsule  11  . OVER THE COUNTER MEDICATION OTC stool softner, takes 3 daily.      [name] oxyCODONE-acetaminophen (PERCOCET) 10-325 MG per tablet Take 1 tablet by mouth every 8 (eight) hours as needed for pain.  75 tablet  0  . potassium chloride SA (K-DUR,KLOR-CON) 20 MEQ tablet Take 1 tablet (20 mEq total) by mouth daily.  30 tablet  2  . rosuvastatin (CRESTOR) 40 MG tablet Take 1 tablet (40 mg total) by mouth daily.  90 tablet  3  . Thiamine HCl (VITAMIN B-1) 250 MG tablet Take 250 mg by mouth every evening.      . traMADol-acetaminophen (ULTRACET) 37.5-325 MG per tablet take 1 tablet by mouth every 6 hours if needed for pain - MUST LAST 30 DAYS  120 tablet  3  . vitamin C (ASCORBIC ACID) 250 MG tablet Take 250 mg by mouth every evening.      [name] ZETIA 10 MG tablet take 1 tablet by mouth once daily *MUST HAVE APPOINTMENT FOR FURTHER REFILLS  15 tablet  0   No current facility-administered medications for this visit.     Past Medical History  Diagnosis Date  . HYPERCHOLESTEROLEMIA-PURE   . GOUT   . OBSTRUCTIVE SLEEP APNEA   . Essential hypertension, benign   . VENOUS INSUFFICIENCY   . ASTHMA   . COPD   .  VENTRAL HERNIA   . OSTEOARTHRITIS   . CATARACT, HX OF   . PULMONARY HYPERTENSION, HX OF   . Hallux valgus (acquired)   . Obesity, unspecified   . Osteoarthrosis, unspecified whether generalized or localized, unspecified site   . Unspecified glaucoma   . Moderate aortic stenosis     Per 12/2012 echo- (VTI 1.08 cm^2, Vmax 1.11 cm ^2), mild LA dilatation  . Paroxysmal atrial fibrillation 2003  . Lumbosacral neuritis   . Unspecified hereditary and idiopathic peripheral neuropathy   . Lumbosacral spondylosis   . CAD (coronary artery disease)     Nonobstructive by 12/2012 cath  . PUD (peptic ulcer disease)   . BPH (benign prostatic hypertrophy)   . HH (hiatus hernia)   . Glaucoma   . GERD (gastroesophageal reflux disease)   . Personal history of colonic adenoma 10/30/2012  . Shortness of breath     with exertion  . Peripheral neuropathy     Heredetary; with chronic neuropathic pain    Past Surgical History  Procedure Laterality Date  . Hemorrhoid surgery    . Vitrectomy    . Intraocular lens insertion Bilateral   . Leg surgery Bilateral   . Knee surgery Right   . Traeculectomy Left     eye  . Shoulder arthroscopy  08/30/2011    Procedure: ARTHROSCOPY SHOULDER;  Surgeon: [name], MD;  Location: [place];  Service: Orthopedics;  Laterality: Right;  Right Shoulder Arthroscopy, Debridement, and Decompression  . Tonsillectomy    . Colonoscopy    . Cholecystectomy    . Toe amputation    . Colonoscopy [name] 10/30/2012    Procedure: COLONOSCOPY;  Surgeon: [name], MD;  [facility];  Service: Endoscopy;  Laterality: [name];  . Cardiac catheterization  1997    Non-obstructive disease  . Cardiac catheterization  12/24/2012    mild < 20% LCx, prox 30% RCA; LVEF 55-65% , moderate pulmonary HTN, moderate AS  . Eye surgery Bilateral cataract  . 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 Marital Status: Married    Spouse Name: [name]    Number of Children: 0  . Years of Education: 20   Occupational History  . engineering     retired   Social History Main Topics  . Smoking status: Never Smoker   . Smokeless tobacco: Never Used  . Alcohol  Use: No  . Drug Use: Yes    Special: Oxycodone  . Sexual Activity: Not Currently   Other Topics Concern  . Not on file   Social History Narrative   HSG, [name] - BS, [name] - MS-engineering, 2 years on PhD - [place]. Married - '65 - 51yr/divorced; '76- 3 yrs/divorced; '92 . No children. 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 ACP/Living Will - Yes CPR; long-term Mechanical ventilation as long as he was able to cognate; ok for long term artificial nutrition. Precondition being able to cognate and not to have too much pain.     ROS: no fevers or chills, productive cough, hemoptysis, dysphasia, odynophagia, melena, hematochezia, dysuria, hematuria, rash, seizure activity, orthopnea, PND, pedal edema, claudication. Remaining systems are negative.  Physical Exam: Well-developed morbidly obese in no acute distress.  Skin is warm and dry.  HEENT is normal.  Neck is supple.  Chest is clear to auscultation with normal expansion.  Cardiovascular exam is regular rate and rhythm. 2/6 systolic mumur LSB Abdominal exam nontender or distended. No masses palpated. Extremities show trace edema; Chronic skin changes. neuro grossly intact  ECG Sinus rhythm at a rate of 79. Right bundle branch block.

## 2014-01-14 NOTE — Assessment & Plan Note (Signed)
Echocardiogram shows moderate aortic stenosis. Will repeat in one year.

## 2014-01-14 NOTE — Assessment & Plan Note (Signed)
Blood pressure controlled. Continue present medications. 

## 2014-01-14 NOTE — Assessment & Plan Note (Signed)
Patient has multiple reasons to be short of breath. These include deconditioning, obesity hypoventilation syndrome, obstructive sleep apnea and diastolic congestive heart failure. He discontinued his Lasix on his own 6 months ago. He is not particularly volume overloaded on examination but exam is difficult. His pulmonary capillary wedge pressure was increased at time of previous catheterization. Resume Lasix 60 mg daily. Check potassium, renal function and BNP today and again in one week. Continue CPAP. He is scheduled to see pulmonary in 2 days for further insight. Note his aortic stenosis remains moderate on echocardiogram and unchanged compared to one year ago.

## 2014-01-14 NOTE — Patient Instructions (Signed)
Your physician recommends that you schedule a follow-up appointment in: [place]  START FUROSEMIDE 20 MG TAKE 3 TABLETS ONCE DAILY  Your physician recommends that you return HAVE LAB [place]

## 2014-01-14 NOTE — Assessment & Plan Note (Signed)
Patient remains in sinus rhythm. Continue beta blocker. Continue apixaban. He has fallen recently. If he falls again I would favor discontinuing apixaban and treating with ASA. Check renal function and hemoglobin.

## 2014-01-15 ENCOUNTER — Other Ambulatory Visit: Payer: Self-pay | Admitting: *Deleted

## 2014-01-15 LAB — CBC
HEMATOCRIT: 36.9 % — AB (ref 39.0–52.0)
HEMOGLOBIN: 12.6 g/dL — AB (ref 13.0–17.0)
MCH: 31.2 pg (ref 26.0–34.0)
MCHC: 34.1 g/dL (ref 30.0–36.0)
MCV: 91.3 fL (ref 78.0–100.0)
Platelets: 153 10*3/uL (ref 150–400)
RBC: 4.04 MIL/uL — ABNORMAL LOW (ref 4.22–5.81)
RDW: 15.3 % (ref 11.5–15.5)
WBC: 7.6 10*3/uL (ref 4.0–10.5)

## 2014-01-15 LAB — BASIC METABOLIC PANEL WITH GFR
BUN: 21 mg/dL (ref 6–23)
CALCIUM: 9 mg/dL (ref 8.4–10.5)
CO2: 29 meq/L (ref 19–32)
Chloride: 102 mEq/L (ref 96–112)
Creat: 0.96 mg/dL (ref 0.50–1.35)
GFR, Est Non African American: 78 mL/min
GLUCOSE: 91 mg/dL (ref 70–99)
Potassium: 4 mEq/L (ref 3.5–5.3)
Sodium: 142 mEq/L (ref 135–145)

## 2014-01-15 LAB — BRAIN NATRIURETIC PEPTIDE: Brain Natriuretic Peptide: 32 pg/mL (ref 0.0–100.0)

## 2014-01-15 MED ORDER — POTASSIUM CHLORIDE CRYS ER 20 MEQ PO TBCR: 20.0000 meq | EXTENDED_RELEASE_TABLET | Freq: Every day | ORAL | Status: DC

## 2014-01-20 ENCOUNTER — Ambulatory Visit: Payer: Medicare Other | Admitting: Internal Medicine

## 2014-01-20 ENCOUNTER — Other Ambulatory Visit: Payer: Self-pay | Admitting: Cardiology

## 2014-01-22 ENCOUNTER — Ambulatory Visit (INDEPENDENT_AMBULATORY_CARE_PROVIDER_SITE_OTHER): Payer: Medicare Other | Admitting: Family Medicine

## 2014-01-22 ENCOUNTER — Encounter: Payer: Self-pay | Admitting: Family Medicine

## 2014-01-22 VITALS — BP 126/76 | HR 74 | Temp 96.7°F | Resp 18 | Ht 73.0 in | Wt 361.0 lb

## 2014-01-22 DIAGNOSIS — R1011 Right upper quadrant pain: Secondary | ICD-10-CM | POA: Insufficient documentation

## 2014-01-22 NOTE — Assessment & Plan Note (Signed)
Cannot do NSAIDs due to pt being on anticoagulant already. Encouraged rest, heat application, watchful waiting. Signs/symptoms to call or return for were reviewed and pt expressed understanding.

## 2014-01-22 NOTE — Progress Notes (Signed)
Pre visit review using our clinic review tool, if applicable. No additional management support is needed unless otherwise documented below in the visit note. 

## 2014-01-22 NOTE — Progress Notes (Signed)
OFFICE VISIT  01/22/2014   CC:  Chief Complaint  Patient presents with  . Abdominal Pain    R sided, started yesterday  . Fall    pt reports leg weakness causing falls ( if time allows )   HPI:    Patient is a 73 y.o. Caucasian male who presents for right upper abd pain that started yesterday soon after getting out of bed.  It has been constant since onset, gets worse with standing/bending , etc.  No nausea or bloating or change in bowel habits.  No bruising in the area.  No rash.  He recalls no trauma or strain or overuse.  The pain is sharp and intense. The pain does not radiate.  Still eating and drinking normally.  No urinary c/o.  NO fevers.   Has not had this kind of pain before.  He says he feels a slight bulge in the area of pain.  Past Medical History  Diagnosis Date  . HYPERCHOLESTEROLEMIA-PURE   . GOUT   . OBSTRUCTIVE SLEEP APNEA   . Essential hypertension, benign   . VENOUS INSUFFICIENCY   . ASTHMA   . COPD   . VENTRAL HERNIA   . OSTEOARTHRITIS   . CATARACT, HX OF   . PULMONARY HYPERTENSION, HX OF   . Hallux valgus (acquired)   . Obesity, unspecified   . Osteoarthrosis, unspecified whether generalized or localized, unspecified site   . Unspecified glaucoma   . Moderate aortic stenosis     Per 12/2012 echo- (VTI 1.08 cm^2, Vmax 1.11 cm ^2), mild LA dilatation  . Paroxysmal atrial fibrillation 2003  . Lumbosacral neuritis   . Unspecified hereditary and idiopathic peripheral neuropathy   . Lumbosacral spondylosis   . CAD (coronary artery disease)     Nonobstructive by 12/2012 cath  . PUD (peptic ulcer disease)   . BPH (benign prostatic hypertrophy)   . HH (hiatus hernia)   . Glaucoma   . GERD (gastroesophageal reflux disease)   . Personal history of colonic adenoma 10/30/2012  . Shortness of breath     with exertion  . Peripheral neuropathy     Heredetary; with chronic neuropathic pain    Past Surgical History  Procedure Laterality Date  . Hemorrhoid  surgery    . Vitrectomy    . Intraocular lens insertion Bilateral   . Leg surgery Bilateral   . Knee surgery Right   . Traeculectomy Left     eye  . Shoulder arthroscopy  08/30/2011    Procedure: ARTHROSCOPY SHOULDER;  Surgeon: [name], MD;  Location: [facility];  Service: Orthopedics;  Laterality: Right;  Right Shoulder Arthroscopy, Debridement, and Decompression  . Tonsillectomy    . Colonoscopy    . Cholecystectomy    . Toe amputation    . Colonoscopy [name] 10/30/2012    Procedure: COLONOSCOPY;  Surgeon: [name], MD;  Location: [facility];  Service: Endoscopy;  Laterality: [name];  . Cardiac catheterization  1997    Non-obstructive disease  . Cardiac catheterization  12/24/2012    mild < 20% LCx, prox 30% RCA; LVEF 55-65% , moderate pulmonary HTN, moderate AS  . Eye surgery Bilateral cataract  . Amputation Left 04/11/2013    Procedure: AMPUTATION DIGIT Left 3rd toe;  Surgeon: [name], MD;  Location: [facility];  Service: Orthopedics;  Laterality: Left;  Left 3rd toe amputation at [facility]    Outpatient Prescriptions Prior to Visit  Medication Sig Dispense Refill  . albuterol (PROVENTIL  HFA;VENTOLIN HFA) 108 (90 BASE) MCG/ACT inhaler Inhale 2 puffs into the lungs 4 (four) times daily as needed for wheezing or shortness of breath.      . allopurinol (ZYLOPRIM) 300 MG tablet take 1 tablet by mouth once daily  30 tablet  3  . amitriptyline (ELAVIL) 75 MG tablet Take 75 mg by mouth at bedtime.      [name] apixaban (ELIQUIS) 5 MG TABS tablet Take 1 tablet (5 mg total) by mouth 2 (two) times daily.  60 tablet  6  . clotrimazole-betamethasone (LOTRISONE) cream Apply 1 application topically 2 (two) times daily.  45 g  3  . finasteride (PROSCAR) 5 MG tablet take 1 tablet by mouth once daily  90 tablet  1  . Fluticasone-Salmeterol (ADVAIR) 250-50 MCG/DOSE AEPB Inhale 1 puff into the lungs daily as needed (for nasal congestion).       . furosemide (LASIX) 20 MG tablet Take 3 tablets once daily  90  tablet  12  . gabapentin (NEURONTIN) 600 MG tablet Take 1 tablet (600 mg total) by mouth 4 (four) times daily - after meals and at bedtime.  120 tablet  2  . losartan (COZAAR) 50 MG tablet take 1 tablet by mouth twice a day  180 tablet  1  . metoprolol (TOPROL-XL) 200 MG 24 hr tablet take 2 tablets by mouth once daily  60 tablet  3  . Multiple Vitamin (MULTIVITAMIN WITH MINERALS) TABS Take 1 tablet by mouth daily.      . niacin (NIASPAN) 1000 MG CR tablet Take 1 tablet (1,000 mg total) by mouth 2 (two) times daily.  60 tablet  3  . Niacin CR 1000 MG TBCR take 1 tablet by mouth twice a day  60 tablet  5  . omeprazole (PRILOSEC) 10 MG capsule take 1 capsule by mouth once daily  30 capsule  11  . OVER THE COUNTER MEDICATION OTC stool softner, takes 3 daily.      [name] oxyCODONE-acetaminophen (PERCOCET) 10-325 MG per tablet Take 1 tablet by mouth every 8 (eight) hours as needed for pain.  75 tablet  0  . potassium chloride SA (K-DUR,KLOR-CON) 20 MEQ tablet Take 1 tablet (20 mEq total) by mouth daily.  90 tablet  3  . rosuvastatin (CRESTOR) 40 MG tablet Take 1 tablet (40 mg total) by mouth daily.  90 tablet  3  . Thiamine HCl (VITAMIN B-1) 250 MG tablet Take 250 mg by mouth every evening.      . traMADol-acetaminophen (ULTRACET) 37.5-325 MG per tablet take 1 tablet by mouth every 6 hours if needed for pain - MUST LAST 30 DAYS  120 tablet  3  . vitamin C (ASCORBIC ACID) 250 MG tablet Take 250 mg by mouth every evening.      [name] ZETIA 10 MG tablet take 1 tablet by mouth once daily  30 tablet  5  . fluconazole (DIFLUCAN) 100 MG tablet        No facility-administered medications prior to visit.    Allergies  Allergen Reactions  . Brimonidine Tartrate     ALPHAGAN-Shortness of breath  . Brinzolamide     AZOPT- Shortness of breath  . Celecoxib     CELLBREX-confusion  . Codeine     Childhood reaction  . Colchicine     diarrhea  . Diltiazem      leg swelling  . Latanoprost     XALATAN- Shortness of  breath  . Rofecoxib  VIOXX- leg swelling  . Timolol Maleate     TIMOPTIC- Aggravated asthma  . Vancomycin     hives/blisters    ROS As per HPI  PE: Blood pressure 126/76, pulse 74, temperature 96.7 F (35.9 C), temperature source Temporal, resp. rate 18, height 6' 1"  (1.854 m), weight 361 lb (163.749 kg), SpO2 97.00%. Gen: Alert, well appearing, morbidly obese WM in NAD.  Patient is oriented to person, place, time, and situation. IZT:IWPY: no injection, icteris, swelling, or exudate.  EOMI, PERRLA. Mouth: lips without lesion/swelling.  Oral mucosa pink and moist. Oropharynx without erythema, exudate, or swelling.  CV: RRR, no m/r/g.   LUNGS: CTA bilat, nonlabored resps, good aeration in all lung fields. ABD: soft mild/mod TTP focally in right mid to right upper abd---his abd is very rotund but nondistended and it is soft and I cannot feel a mass or bulge.  Diastasis recti vs ventral hernia is noted with abd crunch maneuver.  The area of tenderness is also more tender when he does his abd crunch maneuver.  No bruit.  BS normal.   LABS:  None today  IMPRESSION AND PLAN:  Abdominal wall pain in right upper quadrant Cannot do NSAIDs due to pt being on anticoagulant already. Encouraged rest, heat application, watchful waiting. Signs/symptoms to call or return for were reviewed and pt expressed understanding.    He may f/u at his convenience to discuss recent problem with LE weakness and falls (says about 2 mo ago he noted that over a 2 wk period he had progressive trouble with feeling of bilateral leg weakness with ambulation as well as SOB with ambulation.  He says he has seen his pulm and cardiologist since this and they both could not find anything. He denies and new meds during this time.  He says this is a big change for him).  2 wks f/u abd wall pain.

## 2014-01-23 ENCOUNTER — Encounter: Payer: Self-pay | Admitting: Cardiology

## 2014-01-23 NOTE — Telephone Encounter (Signed)
Returned call to patient no answer.LMTC. 

## 2014-01-23 NOTE — Telephone Encounter (Signed)
Patient is returning call from [facility], please call and advise.

## 2014-01-28 ENCOUNTER — Encounter: Payer: Self-pay | Admitting: Family Medicine

## 2014-01-28 ENCOUNTER — Ambulatory Visit (INDEPENDENT_AMBULATORY_CARE_PROVIDER_SITE_OTHER): Payer: Medicare Other | Admitting: Family Medicine

## 2014-01-28 VITALS — BP 78/53 | HR 71 | Temp 97.8°F | Resp 20 | Ht 73.0 in | Wt 360.0 lb

## 2014-01-28 DIAGNOSIS — R209 Unspecified disturbances of skin sensation: Secondary | ICD-10-CM

## 2014-01-28 DIAGNOSIS — M79609 Pain in unspecified limb: Secondary | ICD-10-CM

## 2014-01-28 DIAGNOSIS — R531 Weakness: Principal | ICD-10-CM

## 2014-01-28 DIAGNOSIS — M79652 Pain in left thigh: Secondary | ICD-10-CM

## 2014-01-28 DIAGNOSIS — R5381 Other malaise: Secondary | ICD-10-CM

## 2014-01-28 DIAGNOSIS — I10 Essential (primary) hypertension: Secondary | ICD-10-CM

## 2014-01-28 DIAGNOSIS — M79651 Pain in right thigh: Secondary | ICD-10-CM

## 2014-01-28 DIAGNOSIS — R5383 Other fatigue: Secondary | ICD-10-CM

## 2014-01-28 DIAGNOSIS — R29898 Other symptoms and signs involving the musculoskeletal system: Secondary | ICD-10-CM

## 2014-01-28 DIAGNOSIS — R202 Paresthesia of skin: Secondary | ICD-10-CM

## 2014-01-28 NOTE — Progress Notes (Signed)
Pre visit review using our clinic review tool, if applicable. No additional management support is needed unless otherwise documented below in the visit note. 

## 2014-01-29 ENCOUNTER — Encounter: Payer: Self-pay | Admitting: Registered Nurse

## 2014-01-29 ENCOUNTER — Encounter (HOSPITAL_BASED_OUTPATIENT_CLINIC_OR_DEPARTMENT_OTHER): Payer: Medicare Other | Admitting: Registered Nurse

## 2014-01-29 VITALS — BP 131/40 | HR 77 | Resp 14 | Ht 74.0 in | Wt 364.0 lb

## 2014-01-29 DIAGNOSIS — G8929 Other chronic pain: Secondary | ICD-10-CM

## 2014-01-29 DIAGNOSIS — G608 Other hereditary and idiopathic neuropathies: Secondary | ICD-10-CM

## 2014-01-29 DIAGNOSIS — G894 Chronic pain syndrome: Secondary | ICD-10-CM

## 2014-01-29 DIAGNOSIS — Z5181 Encounter for therapeutic drug level monitoring: Secondary | ICD-10-CM | POA: Diagnosis not present

## 2014-01-29 DIAGNOSIS — Z79899 Other long term (current) drug therapy: Secondary | ICD-10-CM

## 2014-01-29 LAB — SEDIMENTATION RATE: Sed Rate: 36 mm/hr — ABNORMAL HIGH (ref 0–16)

## 2014-01-29 LAB — COMPREHENSIVE METABOLIC PANEL
ALBUMIN: 3.7 g/dL (ref 3.5–5.2)
ALK PHOS: 54 U/L (ref 39–117)
ALT: 18 U/L (ref 0–53)
AST: 23 U/L (ref 0–37)
BILIRUBIN TOTAL: 0.7 mg/dL (ref 0.2–1.2)
BUN: 15 mg/dL (ref 6–23)
CO2: 29 mEq/L (ref 19–32)
Calcium: 9.4 mg/dL (ref 8.4–10.5)
Chloride: 103 mEq/L (ref 96–112)
Creatinine, Ser: 0.8 mg/dL (ref 0.4–1.5)
GFR: 98.96 mL/min (ref >60.00)
Glucose, Bld: 130 mg/dL — ABNORMAL HIGH (ref 70–99)
POTASSIUM: 4.9 meq/L (ref 3.5–5.1)
Sodium: 138 mEq/L (ref 135–145)
TOTAL PROTEIN: 6.3 g/dL (ref 6.0–8.3)

## 2014-01-29 LAB — CK: Total CK: 58 U/L (ref 7–232)

## 2014-01-29 MED ORDER — OXYCODONE-ACETAMINOPHEN 10-325 MG PO TABS: 1.0000 | ORAL_TABLET | Freq: Three times a day (TID) | ORAL | Status: DC | PRN

## 2014-01-29 NOTE — Progress Notes (Signed)
OFFICE VISIT  01/30/2014  CC:  Chief Complaint  Patient presents with  . Extremity Weakness  . Dizziness   HPI:    Patient is a 73 y.o. Caucasian male who presents for recent problem with LE weakness and falls (says about 2 mo ago he noted that over a 2 wk period he had progressive trouble with the feeling of bilateral leg weakness with ambulation as well as SOB with ambulation. He says he has seen his pulm and cardiologist since this and they both could not find anything.  He denies and new meds during this time. He says this is a big change for him.  Of note, the abd wall pain he was here for 2 weeks ago has completely resolved.  I have reviewed his pulmonologists notes from his recent eval [facility], 01/16/14): PFTs unchanged from prior, CXR showed stable COPD changes with probable bullous dz in apices, with no acute dz but a small pulm nodule was noted.  A f/u CT of chest was done yesterday and it showed NO nodule, no acute abnormality.  Their assessment was stable COPD and OSA.. I reviewed his cardiologist's recent evaluation from 01/14/14: stable AS (moderate) on repeat echo, diastolic dysfxn, EKG showed NSR with RBBB, impression was also stable HTN and PAF.  No volume overload on exam but he was put back on lasix 65m qd to see if this helped and pt reports he could tolerate only 3d of this due to frequent urination causing incontinence issues--so he has stopped this med.  Denies feeling and shoulder girdle, neck, or truncal weakness. He reports an increase in his chronic LBP that coincided with the feeling of subjective LE weakness + quadriceps pain bilat --all with ambulation.  At rest, he has no leg pains.  Back pain and quad pain worse with standing/walking, not relieved by leaning over.  Denies gluteal or hamstring pain.  Denies new paresthesias.  He has chronic numbness in both feet, with some intermittent pain in feet, L>R, and this has long been attributed to a  previously diagnosed peripheral neuropathy ("hereditary").  Denies saddle anesthesia or loss of bowel or bladder control.   Currently reports that his SOB with walking has been less of a problem of late compared to the feeling of leg weakness.  Denies chest pain.  He does have some occ brief dizziness/presyncopal episodes upon standing up.  This, combined with the feeling of clumsiness in legs lately has led to at least 1 fall but w/out injury.   Normally, his bp runs >120s/70s and HR 80s.     Past Medical History  Diagnosis Date  . HYPERCHOLESTEROLEMIA-PURE   . GOUT   . OBSTRUCTIVE SLEEP APNEA   . Essential hypertension, benign   . VENOUS INSUFFICIENCY   . ASTHMA   . COPD   . VENTRAL HERNIA   . OSTEOARTHRITIS   . CATARACT, HX OF   . PULMONARY HYPERTENSION, HX OF   . Hallux valgus (acquired)   . Obesity, unspecified   . Osteoarthrosis, unspecified whether generalized or localized, unspecified site   . Unspecified glaucoma   . Moderate aortic stenosis     Per 12/2012 echo- (VTI 1.08 cm^2, Vmax 1.11 cm ^2), mild LA dilatation  . Paroxysmal atrial fibrillation 2003  . Lumbosacral neuritis   . Unspecified hereditary and idiopathic peripheral neuropathy approx age 73   bilat LE's, ? left arm, too.  Feet became progressively numb + left foot pain intermittently.  . Lumbosacral spondylosis   .  CAD (coronary artery disease)     Nonobstructive by 12/2012 cath  . PUD (peptic ulcer disease)   . BPH (benign prostatic hypertrophy)   . HH (hiatus hernia)   . Glaucoma   . GERD (gastroesophageal reflux disease)   . Personal history of colonic adenoma 10/30/2012  . Shortness of breath     with exertion  . Peripheral neuropathy     Heredetary; with chronic neuropathic pain    Past Surgical History  Procedure Laterality Date  . Hemorrhoid surgery    . Vitrectomy    . Intraocular lens insertion Bilateral   . Leg surgery Bilateral   . Knee surgery Right   . Traeculectomy Left     eye   . Shoulder arthroscopy  08/30/2011    Procedure: ARTHROSCOPY SHOULDER;  Surgeon: [name], MD;  Location: [place];  Service: Orthopedics;  Laterality: Right;  Right Shoulder Arthroscopy, Debridement, and Decompression  . Tonsillectomy    . Colonoscopy    . Cholecystectomy    . Toe amputation    . Colonoscopy [name] 10/30/2012    Procedure: COLONOSCOPY;  Surgeon: [name], MD;  Location: [facility];  Service: Endoscopy;  Laterality: [name];  . Cardiac catheterization  1997    Non-obstructive disease  . Cardiac catheterization  12/24/2012    mild < 20% LCx, prox 30% RCA; LVEF 55-65% , moderate pulmonary HTN, moderate AS  . Eye surgery Bilateral cataract  . Amputation Left 04/11/2013    Procedure: AMPUTATION DIGIT Left 3rd toe;  Surgeon: [name], MD;  Location: [place];  Service: Orthopedics;  Laterality: Left;  Left 3rd toe amputation at [facility]    Outpatient Prescriptions Prior to Visit  Medication Sig Dispense Refill  . albuterol (PROVENTIL HFA;VENTOLIN HFA) 108 (90 BASE) MCG/ACT inhaler Inhale 2 puffs into the lungs 4 (four) times daily as needed for wheezing or shortness of breath.      . allopurinol (ZYLOPRIM) 300 MG tablet take 1 tablet by mouth once daily  30 tablet  3  . amitriptyline (ELAVIL) 75 MG tablet Take 75 mg by mouth at bedtime.      [name] apixaban (ELIQUIS) 5 MG TABS tablet Take 1 tablet (5 mg total) by mouth 2 (two) times daily.  60 tablet  6  . clotrimazole-betamethasone (LOTRISONE) cream Apply 1 application topically 2 (two) times daily.  45 g  3  . finasteride (PROSCAR) 5 MG tablet take 1 tablet by mouth once daily  90 tablet  1  . Fluticasone-Salmeterol (ADVAIR) 250-50 MCG/DOSE AEPB Inhale 1 puff into the lungs daily as needed (for nasal congestion).       . furosemide (LASIX) 20 MG tablet Take 3 tablets once daily  90 tablet  12  . gabapentin (NEURONTIN) 600 MG tablet Take 1 tablet (600 mg total) by mouth 4 (four) times daily - after meals and at bedtime.  120 tablet   2  . Multiple Vitamin (MULTIVITAMIN WITH MINERALS) TABS Take 1 tablet by mouth daily.      . niacin (NIASPAN) 1000 MG CR tablet Take 1 tablet (1,000 mg total) by mouth 2 (two) times daily.  60 tablet  3  . Niacin CR 1000 MG TBCR take 1 tablet by mouth twice a day  60 tablet  5  . omeprazole (PRILOSEC) 10 MG capsule take 1 capsule by mouth once daily  30 capsule  11  . OVER THE COUNTER MEDICATION OTC stool softner, takes 3 daily.      [name]  potassium chloride SA (K-DUR,KLOR-CON) 20 MEQ tablet Take 1 tablet (20 mEq total) by mouth daily.  90 tablet  3  . rosuvastatin (CRESTOR) 40 MG tablet Take 1 tablet (40 mg total) by mouth daily.  90 tablet  3  . Thiamine HCl (VITAMIN B-1) 250 MG tablet Take 250 mg by mouth every evening.      . traMADol-acetaminophen (ULTRACET) 37.5-325 MG per tablet take 1 tablet by mouth every 6 hours if needed for pain - MUST LAST 30 DAYS  120 tablet  3  . vitamin C (ASCORBIC ACID) 250 MG tablet Take 250 mg by mouth every evening.      [name] ZETIA 10 MG tablet take 1 tablet by mouth once daily  30 tablet  5  . losartan (COZAAR) 50 MG tablet take 1 tablet by mouth twice a day  180 tablet  1  . metoprolol (TOPROL-XL) 200 MG 24 hr tablet take 2 tablets by mouth once daily  60 tablet  3  . oxyCODONE-acetaminophen (PERCOCET) 10-325 MG per tablet Take 1 tablet by mouth every 8 (eight) hours as needed for pain.  75 tablet  0   No facility-administered medications prior to visit.    Allergies  Allergen Reactions  . Brimonidine Tartrate     ALPHAGAN-Shortness of breath  . Brinzolamide     AZOPT- Shortness of breath  . Celecoxib     CELLBREX-confusion  . Codeine     Childhood reaction  . Colchicine     diarrhea  . Diltiazem      leg swelling  . Latanoprost     XALATAN- Shortness of breath  . Rofecoxib      VIOXX- leg swelling  . Timolol Maleate     TIMOPTIC- Aggravated asthma  . Vancomycin     hives/blisters    ROS As per HPI  PE: Blood pressure 78/53, pulse 71,  temperature 97.8 F (36.6 C), temperature source Temporal, resp. rate 20, height _0  (1.854 m), weight 360 lb (163.295 kg), SpO2 94.00%. Gen: Alert, well appearing.  Patient is oriented to person, place, time, and situation. He did not get dizzy when he stood up to get onto the exam table.  Had to use both arms to push up off of his chair to stand. He barely could step on exam table step and use leg to get himself onto step.  After our visit, he came out of the room walking normally--slow, then stated he felt dizzy and wobbled a bit.  He put his hand on the wall and we then sat him in a chair.  It took about 30 sec for him to feel back to normal.  No CP or palpitations or SOB during this time. AFFECT: a bit flat.  Lucid thought and speech. BHA:LPFX: no injection, icteris, swelling, or exudate.  EOMI, PERRLA. Mouth: lips without lesion/swelling.  Oral mucosa pink and moist. Oropharynx without erythema, exudate, or swelling.  Neck - No masses or thyromegaly or limitation in range of motion CV: RRR, no m/r/g.   LUNGS: CTA bilat, nonlabored resps, good aeration in all lung fields. ABD: soft, NT/ND EXT: no clubbing or cyanosis.  1-2 + pitting edema bilat. Neuro: CN 2-12 intact bilaterally, strength 5/5 in proximal and distal upper extremities and lower extremities bilaterally.  No sensory deficits.  No tremor.  No disdiadochokinesis.  I could not elicit any DTRs in UE's or LE's.  No pronator drift. Gait: slow, small steps.  LABS:  None today  IMPRESSION  AND PLAN:  Paresthesias with subjective weakness Plus thigh pain. This involves both legs, is subacute, and is in the context of a patient who already has a documented peripheral neuropathy. I think he will ultimately need to see a neurologist.  Exam did not have remarkable objective findings, although I did not do a sensory exam. I want to look at an MRI L spine w/out contrast to f/u his history of DDD/spondylosis with spinal nerve impingement.   His current symptoms could represent a progression of this condition to considerable spinal stenosis. Will also check CPK, CMET, ESR, Vit B12 level.  Essential hypertension, benign His bp is low today. He has had a couple of episodes of orthostatic dizziness. I will decrease his toprol XL to 112m qd and have him hold his losartan until further notice. No more diuretic at this time.   An After Visit Summary was printed and given to the patient.  FOLLOW UP: Return for pt already has appt--[place] [facility] [place] [facility] [place]

## 2014-01-29 NOTE — Progress Notes (Signed)
Subjective:    Patient ID: Tim [name], male    DOB: July 08, 1940, 73 y.o.   MRN: [id number]  HPI:Tim [name] is a 73 year old male who returns for follow up for chronic pain and medication refill. His pain usually is located in his lower back and left foot. At this time he is pain free He rates his pain 0. He is living a sedentary lifestyle. He states: he doesn't have the stamina and he is SOB with the least exertion. His PCP has sent him to the cardiologist and [place] he went to Dr. Ernestine Conrad office and he was hypotensive. His Cozaar was discontinued and metoprolol dose was changed. Dr. Ernestine Conrad following. He has been encouraged to start chair exercises and this has been demonstrated he verbalizes understanding.   Pain Inventory Average Pain 3 Pain Right Now 0 My pain is intermittent, sharp, dull, stabbing and aching  In the last 24 hours, has pain interfered with the following? General activity 7 Relation with others 9 Enjoyment of life 7 What TIME of day is your pain at its worst? evening, night Sleep (in general) Good  Pain is worse with: walking, standing and some activites Pain improves with: rest, medication and injections Relief from Meds: 7  Mobility walk with assistance use a cane how many minutes can you walk? 3-5 ability to climb steps?  yes do you drive?  yes use a wheelchair transfers alone  Function retired  Neuro/Psych bladder control problems tremor trouble walking dizziness  Prior Studies Any changes since last visit?  yes x-rays CT/MRI  Physicians involved in your care Any changes since last visit?  no   Family History  Problem Relation Age of Onset  . Hypertension Mother   . Coronary artery disease Mother   . Heart attack Mother   . Pulmonary fibrosis Father     asbestosis   History   Social History  . Marital Status: Married    Spouse Name: [name]    Number of Children: 0  . Years of Education: 20    Occupational History  . engineering     retired   Social History Main Topics  . Smoking status: Never Smoker   . Smokeless tobacco: Never Used  . Alcohol Use: No  . Drug Use: Yes    Special: Oxycodone  . Sexual Activity: Not Currently   Other Topics Concern  . None   Social History Narrative   HSG, [name] - BS, [name] - MS-engineering, 2 years on PhD - [name]. Married - '65 - 37yr/divorced; '76- 3 yrs/divorced; '92 . No children. 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 ACP/Living Will - Yes CPR; long-term Mechanical ventilation as long as he was able to cognate; ok for long term artificial nutrition. Precondition being able to cognate and not to have too much pain.    Past Surgical History  Procedure Laterality Date  . Hemorrhoid surgery    . Vitrectomy    . Intraocular lens insertion Bilateral   . Leg surgery Bilateral   . Knee surgery Right   . Traeculectomy Left     eye  . Shoulder arthroscopy  08/30/2011    Procedure: ARTHROSCOPY SHOULDER;  Surgeon: [name] MD;  Location: [facility]  Service: Orthopedics;  Laterality: Right;  Right Shoulder Arthroscopy, Debridement, and Decompression  . Tonsillectomy    . Colonoscopy    . Cholecystectomy    . Toe amputation    .  Colonoscopy [name] 10/30/2012    Procedure: COLONOSCOPY;  Surgeon: [name], MD;  Location: [facility];  Service: Endoscopy;  Laterality: [name];  . Cardiac catheterization  1997    Non-obstructive disease  . Cardiac catheterization  12/24/2012    mild < 20% LCx, prox 30% RCA; LVEF 55-65% , moderate pulmonary HTN, moderate AS  . Eye surgery Bilateral cataract  . Amputation Left 04/11/2013    Procedure: AMPUTATION DIGIT Left 3rd toe;  Surgeon: [name], MD;  Location: [facility];  Service: Orthopedics;  Laterality: Left;  Left 3rd toe amputation at [facility]   Past Medical History  Diagnosis Date  . HYPERCHOLESTEROLEMIA-PURE   . GOUT   . OBSTRUCTIVE SLEEP APNEA   .  Essential hypertension, benign   . VENOUS INSUFFICIENCY   . ASTHMA   . COPD   . VENTRAL HERNIA   . OSTEOARTHRITIS   . CATARACT, HX OF   . PULMONARY HYPERTENSION, HX OF   . Hallux valgus (acquired)   . Obesity, unspecified   . Osteoarthrosis, unspecified whether generalized or localized, unspecified site   . Unspecified glaucoma   . Moderate aortic stenosis     Per 12/2012 echo- (VTI 1.08 cm^2, Vmax 1.11 cm ^2), mild LA dilatation  . Paroxysmal atrial fibrillation 2003  . Lumbosacral neuritis   . Unspecified hereditary and idiopathic peripheral neuropathy approx age 44    bilat LE's, ? left arm, too.  Feet became progressively numb + left foot pain intermittently.  . Lumbosacral spondylosis   . CAD (coronary artery disease)     Nonobstructive by 12/2012 cath  . PUD (peptic ulcer disease)   . BPH (benign prostatic hypertrophy)   . HH (hiatus hernia)   . Glaucoma   . GERD (gastroesophageal reflux disease)   . Personal history of colonic adenoma 10/30/2012  . Shortness of breath     with exertion  . Peripheral neuropathy     Heredetary; with chronic neuropathic pain   BP 131/40  Pulse 77  Resp 14  Ht 6' 2"  (1.88 m)  Wt 364 lb (165.109 kg)  BMI 46.71 kg/m2  SpO2 96%  Opioid Risk Score:   Fall Risk Score: High Fall Risk (>13 points) (pt educated on fall risk, brochure given to pt previously)    Review of Systems  Constitutional: Positive for fever, chills, diaphoresis and unexpected weight change.  Respiratory: Positive for apnea and shortness of breath.   Gastrointestinal: Positive for abdominal pain, diarrhea and constipation.  Genitourinary:       Bladder control problems  Musculoskeletal: Positive for gait problem.  Skin: Positive for rash.  Neurological: Positive for dizziness and tremors.  Hematological: Bruises/bleeds easily.  All other systems reviewed and are negative.      Objective:   Physical Exam  Nursing note and vitals reviewed. Constitutional: He  is oriented to person, place, and time. He appears well-developed and well-nourished.  HENT:  Head: Normocephalic and atraumatic.  Neck: Normal range of motion. Neck supple.  Cardiovascular: Normal rate, regular rhythm and normal heart sounds.   Pulmonary/Chest: Effort normal and breath sounds normal.  Musculoskeletal:  Normal Muscle Bulk and Muscle testing Reveals: Upper Extremities: Decreased ROM on the Right 90 Degrees and muscle strength 5/5. Left Full ROM and Muscle Strength 5/5. Back without spinal or paraspinal tenderness. Lower Extremities: Full ROM and Muscle Strength 5/5 Arises from chair with ease  Narrow based gait   Neurological: He is alert and oriented to person, place, and time.  Skin: Skin is warm and dry.  Psychiatric: He has a normal mood and affect.          Assessment & Plan:  1. 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 Will Continue to Monitor.  Refilled: Percocet 10/325 mg # 75 pills---use one every 8 hours as needed for pain and Continue Tramadol 37.5/325mg one tablet every 6 hours as needed. Continue with Gabapentin.  2. DDD lumbar spine with LBP: Stable at this time. Continue current pain regimen. Encourage increase activity and heat therapy.  3. Insomnia: Continue Elavil.  20 minutes of face to face patient care time was spent during this visit. All questions were encouraged and answered.   F/U in 1 month

## 2014-01-30 DIAGNOSIS — R202 Paresthesia of skin: Secondary | ICD-10-CM | POA: Insufficient documentation

## 2014-01-30 DIAGNOSIS — R531 Weakness: Principal | ICD-10-CM

## 2014-01-30 DIAGNOSIS — M79651 Pain in right thigh: Secondary | ICD-10-CM | POA: Insufficient documentation

## 2014-01-30 DIAGNOSIS — M79652 Pain in left thigh: Secondary | ICD-10-CM

## 2014-01-30 LAB — TSH: TSH: 2.36 u[IU]/mL (ref 0.35–4.50)

## 2014-01-30 LAB — VITAMIN B12: Vitamin B-12: 680 pg/mL (ref 211–911)

## 2014-01-30 NOTE — Telephone Encounter (Signed)
This encounter was created in error - please disregard.

## 2014-01-30 NOTE — Assessment & Plan Note (Signed)
His bp is low today. He has had a couple of episodes of orthostatic dizziness. I will decrease his toprol XL to 179m qd and have him hold his losartan until further notice. No more diuretic at this time.

## 2014-01-30 NOTE — Assessment & Plan Note (Addendum)
Plus thigh pain. This involves both legs, is subacute, and is in the context of a patient who already has a documented peripheral neuropathy. I think he will ultimately need to see a neurologist.  Exam did not have remarkable objective findings, although I did not do a sensory exam. I want to look at an MRI L spine w/out contrast to f/u his history of DDD/spondylosis with spinal nerve impingement.  His current symptoms could represent a progression of this condition to considerable spinal stenosis. Will also check CPK, CMET, ESR, Vit B12 level.

## 2014-01-31 ENCOUNTER — Ambulatory Visit (HOSPITAL_BASED_OUTPATIENT_CLINIC_OR_DEPARTMENT_OTHER)
Admission: RE | Admit: 2014-01-31 | Discharge: 2014-01-31 | Disposition: A | Discharge status: Home / Self Care | Payer: Medicare Other | Source: Ambulatory Visit | Attending: Family Medicine | Admitting: Family Medicine

## 2014-01-31 DIAGNOSIS — M48061 Spinal stenosis, lumbar region without neurogenic claudication: Secondary | ICD-10-CM | POA: Insufficient documentation

## 2014-01-31 DIAGNOSIS — M545 Low back pain, unspecified: Secondary | ICD-10-CM | POA: Diagnosis present

## 2014-01-31 DIAGNOSIS — M79651 Pain in right thigh: Secondary | ICD-10-CM

## 2014-01-31 DIAGNOSIS — R209 Unspecified disturbances of skin sensation: Secondary | ICD-10-CM | POA: Diagnosis present

## 2014-01-31 DIAGNOSIS — M79609 Pain in unspecified limb: Secondary | ICD-10-CM | POA: Diagnosis present

## 2014-01-31 DIAGNOSIS — R202 Paresthesia of skin: Secondary | ICD-10-CM

## 2014-01-31 DIAGNOSIS — R29898 Other symptoms and signs involving the musculoskeletal system: Secondary | ICD-10-CM

## 2014-01-31 DIAGNOSIS — R531 Weakness: Secondary | ICD-10-CM

## 2014-01-31 DIAGNOSIS — M79652 Pain in left thigh: Secondary | ICD-10-CM

## 2014-02-05 ENCOUNTER — Ambulatory Visit: Payer: Medicare Other | Admitting: Family Medicine

## 2014-02-09 ENCOUNTER — Telehealth: Payer: Self-pay

## 2014-02-09 NOTE — Telephone Encounter (Signed)
Tim [name] called and left his blood pressure readings.   Sun am 120/72  Pm 138/75   Mon am 138/82  Tues am 123/68   Wed am 124/70  Thurs am 119/77    Pm 140/64  Fri am 131/70

## 2014-02-09 NOTE — Telephone Encounter (Signed)
These look great. I recommend he continue to stay off the losartan and continue taking only 100 mg of his toprol once a day-thx

## 2014-02-10 ENCOUNTER — Telehealth: Payer: Self-pay

## 2014-02-11 ENCOUNTER — Other Ambulatory Visit: Payer: Self-pay | Admitting: Registered Nurse

## 2014-02-11 ENCOUNTER — Other Ambulatory Visit: Payer: Self-pay | Admitting: Family Medicine

## 2014-02-11 MED ORDER — POTASSIUM CHLORIDE CRYS ER 20 MEQ PO TBCR: 20.0000 meq | EXTENDED_RELEASE_TABLET | Freq: Every day | ORAL | Status: DC

## 2014-02-11 MED ORDER — FINASTERIDE 5 MG PO TABS: ORAL_TABLET | ORAL | Status: DC

## 2014-02-13 NOTE — Telephone Encounter (Signed)
Spoke with pt, advised message from Dr Anitra Lauth. Pt understood.

## 2014-02-17 ENCOUNTER — Other Ambulatory Visit: Payer: Self-pay | Admitting: Family Medicine

## 2014-02-17 MED ORDER — POTASSIUM CHLORIDE CRYS ER 20 MEQ PO TBCR: 20.0000 meq | EXTENDED_RELEASE_TABLET | Freq: Every day | ORAL | Status: DC

## 2014-02-18 ENCOUNTER — Ambulatory Visit (INDEPENDENT_AMBULATORY_CARE_PROVIDER_SITE_OTHER): Payer: Medicare Other | Admitting: Family Medicine

## 2014-02-18 ENCOUNTER — Encounter: Payer: Self-pay | Admitting: Family Medicine

## 2014-02-18 ENCOUNTER — Telehealth: Payer: Self-pay | Admitting: Family Medicine

## 2014-02-18 VITALS — BP 165/83 | HR 67 | Temp 97.3°F | Ht 73.0 in | Wt 364.0 lb

## 2014-02-18 DIAGNOSIS — I951 Orthostatic hypotension: Secondary | ICD-10-CM

## 2014-02-18 DIAGNOSIS — I959 Hypotension, unspecified: Secondary | ICD-10-CM

## 2014-02-18 DIAGNOSIS — R202 Paresthesia of skin: Secondary | ICD-10-CM

## 2014-02-18 DIAGNOSIS — R5383 Other fatigue: Secondary | ICD-10-CM

## 2014-02-18 DIAGNOSIS — I9589 Other hypotension: Secondary | ICD-10-CM

## 2014-02-18 DIAGNOSIS — R5381 Other malaise: Secondary | ICD-10-CM

## 2014-02-18 DIAGNOSIS — R209 Unspecified disturbances of skin sensation: Secondary | ICD-10-CM

## 2014-02-18 DIAGNOSIS — R531 Weakness: Principal | ICD-10-CM

## 2014-02-18 DIAGNOSIS — I10 Essential (primary) hypertension: Secondary | ICD-10-CM

## 2014-02-18 NOTE — Progress Notes (Signed)
OFFICE NOTE  02/18/2014  CC:  Chief Complaint  Patient presents with  . Follow-up   HPI: Patient is a 73 y.o. Caucasian male who is here for 3 wk f/u bp--hx of low bp problems recently and I decreased his toprol XL to 131m qd and stopped his losartan.  A bit of confusion resulted in him d/c'ing his crestor for the last week instead of the losartan and during this week he felt weakness, dizziness/lightheadedness return.  Prior to that, when off of the correct meds, he says he had been feeling much improved regarding energy level and dizziness/lightheadedness.  He still has not been set up to see a neurologist, unfortunately.  Pertinent PMH:  Past medical, surgical, social, and family history reviewed and no changes are noted since last office visit.  MEDS: Not taking lasix currently Outpatient Prescriptions Prior to Visit  Medication Sig Dispense Refill  . albuterol (PROVENTIL HFA;VENTOLIN HFA) 108 (90 BASE) MCG/ACT inhaler Inhale 2 puffs into the lungs 4 (four) times daily as needed for wheezing or shortness of breath.      . allopurinol (ZYLOPRIM) 300 MG tablet take 1 tablet by mouth once daily  30 tablet  3  . amitriptyline (ELAVIL) 75 MG tablet Take 75 mg by mouth at bedtime.      .Marland Kitchenapixaban (ELIQUIS) 5 MG TABS tablet Take 1 tablet (5 mg total) by mouth 2 (two) times daily.  60 tablet  6  . clotrimazole-betamethasone (LOTRISONE) cream Apply 1 application topically 2 (two) times daily.  45 g  3  . finasteride (PROSCAR) 5 MG tablet take 1 tablet by mouth once daily  90 tablet  1  . Fluticasone-Salmeterol (ADVAIR) 250-50 MCG/DOSE AEPB Inhale 1 puff into the lungs daily as needed (for nasal congestion).       . furosemide (LASIX) 20 MG tablet Take 3 tablets once daily  90 tablet  12  . gabapentin (NEURONTIN) 600 MG tablet take 1 tablet by mouth four times a day AFTER MEALS AND AT BEDTIME  120 tablet  2  . metoprolol (TOPROL-XL) 200 MG 24 hr tablet take 1 tablet by mouth once daily      .  Multiple Vitamin (MULTIVITAMIN WITH MINERALS) TABS Take 1 tablet by mouth daily.      . niacin (NIASPAN) 1000 MG CR tablet Take 1 tablet (1,000 mg total) by mouth 2 (two) times daily.  60 tablet  3  . Niacin CR 1000 MG TBCR take 1 tablet by mouth twice a day  60 tablet  5  . omeprazole (PRILOSEC) 10 MG capsule take 1 capsule by mouth once daily  30 capsule  11  . OVER THE COUNTER MEDICATION OTC stool softner, takes 3 daily.      .Marland KitchenoxyCODONE-acetaminophen (PERCOCET) 10-325 MG per tablet Take 1 tablet by mouth every 8 (eight) hours as needed for pain.  75 tablet  0  . potassium chloride SA (K-DUR,KLOR-CON) 20 MEQ tablet Take 1 tablet (20 mEq total) by mouth daily.  90 tablet  1  . rosuvastatin (CRESTOR) 40 MG tablet Take 1 tablet (40 mg total) by mouth daily.  90 tablet  3  . Thiamine HCl (VITAMIN B-1) 250 MG tablet Take 250 mg by mouth every evening.      . traMADol-acetaminophen (ULTRACET) 37.5-325 MG per tablet take 1 tablet by mouth every 6 hours if needed for pain - MUST LAST 30 DAYS  120 tablet  3  . vitamin C (ASCORBIC ACID) 250 MG tablet  Take 250 mg by mouth every evening.      [name] ZETIA 10 MG tablet take 1 tablet by mouth once daily  30 tablet  5   No facility-administered medications prior to visit.    PE: Blood pressure 165/83, pulse 67, temperature 97.3 F (36.3 C), temperature source Temporal, height 6' 1"  (1.854 m), weight 364 lb (165.109 kg), SpO2 98.00%. Gen: Alert, well appearing.  Patient is oriented to person, place, time, and situation. CV: RRR, soft systolic murmur, S1 and S2 distant, rate about 70. LUNGS: CTA bilat,nonlabored resps.  Mild prolongation of exp phase but no coughing. EXT: puffy non pitting edema for the most part, flaky pinkish dermatitis but no erythema or tenderness. 1+ bilat pitting edema.  IMPRESSION AND PLAN:  1) HTN: recently having symptomatic low bp--this has been improved since lowering Toprol XL to 140m qd and stopping his losartan.  Recheck in  office in 1 mo to review bp's and HR's and see how he's feeling.  May have to add back low-dose losartan but will allow syst to be in 160s and diast to be in 90s.    2) LE weakness, diffuse: unclear etiology.  Seems more subjective than objective. Recent L spine MRI brings into question the possibility of atypical sx's of his lumbar spondylosis causing this. I neglected to enter neurology referral when I said I would after he got his MRI, so I have entered this referral today.  FOLLOW UP: 1 mo, f/u bp/hr/energy

## 2014-02-18 NOTE — Progress Notes (Signed)
Pre visit review using our clinic review tool, if applicable. No additional management support is needed unless otherwise documented below in the visit note. 

## 2014-02-18 NOTE — Telephone Encounter (Signed)
Relevant patient education assigned to patient using Emmi. ° °

## 2014-02-25 ENCOUNTER — Encounter: Payer: Self-pay | Admitting: Registered Nurse

## 2014-02-25 ENCOUNTER — Encounter: Payer: Medicare Other | Attending: Registered Nurse | Admitting: Registered Nurse

## 2014-02-25 VITALS — BP 159/60 | HR 89 | Resp 14 | Ht 74.0 in | Wt 360.0 lb

## 2014-02-25 DIAGNOSIS — Z5181 Encounter for therapeutic drug level monitoring: Secondary | ICD-10-CM | POA: Insufficient documentation

## 2014-02-25 DIAGNOSIS — G894 Chronic pain syndrome: Secondary | ICD-10-CM

## 2014-02-25 DIAGNOSIS — G8929 Other chronic pain: Secondary | ICD-10-CM | POA: Diagnosis present

## 2014-02-25 DIAGNOSIS — Z79899 Other long term (current) drug therapy: Secondary | ICD-10-CM | POA: Diagnosis present

## 2014-02-25 DIAGNOSIS — G608 Other hereditary and idiopathic neuropathies: Secondary | ICD-10-CM

## 2014-02-25 MED ORDER — AMITRIPTYLINE HCL 100 MG PO TABS: 100.0000 mg | ORAL_TABLET | Freq: Every day | ORAL | Status: DC

## 2014-02-25 MED ORDER — OXYCODONE-ACETAMINOPHEN 10-325 MG PO TABS: 1.0000 | ORAL_TABLET | Freq: Three times a day (TID) | ORAL | Status: DC | PRN

## 2014-02-25 NOTE — Progress Notes (Signed)
Subjective:    Patient ID: Tim [name], male    DOB: 1941-06-24, 73 y.o.   MRN: [id number]  HPI: [name] is a 73 year old male who returns for follow up for chronic pain and medication refill. His pain is located in his left foot. He rates his pain 3. His current exercise regime is walking daily.  He says most of his pain is during the night.Also complaining of Insomnia.   Pain Inventory Average Pain 8 Pain Right Now 3 My pain is sharp, stabbing and other  In the last 24 hours, has pain interfered with the following? General activity 7 Relation with others 8 Enjoyment of life 5 What TIME of day is your pain at its worst? night Sleep (in general) Poor  Pain is worse with: walking, bending, standing and some activites Pain improves with: rest, pacing activities and medication Relief from Meds: 6  Mobility walk with assistance use a cane how many minutes can you walk? 5 ability to climb steps?  no do you drive?  yes transfers alone  Function not employed: date last employed 2003 retired I need assistance with the following:  meal prep, household duties and shopping  Neuro/Psych weakness numbness trouble walking  Prior Studies Any changes since last visit?  yes x-rays CT/MRI  Physicians involved in your care Any changes since last visit?  yes Primary care Dr. Ricardo Jericho   Family History  Problem Relation Age of Onset  . Hypertension Mother   . Coronary artery disease Mother   . Heart attack Mother   . Pulmonary fibrosis Father     asbestosis   History   Social History  . Marital Status: Married    Spouse Name: [name]    Number of Children: 0  . Years of Education: 20   Occupational History  . engineering     retired   Social History Main Topics  . Smoking status: Never Smoker   . Smokeless tobacco: Never Used  . Alcohol Use: No  . Drug Use: Yes    Special: Oxycodone  . Sexual Activity: Not Currently   Other Topics Concern    . None   Social History Narrative   HSG, [name] - BS, [name] - MS-engineering, 2 years on PhD - [name]. Married - '65 - 70yr/divorced; '76- 3 yrs/divorced; '92 . No children. 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 ACP/Living Will - Yes CPR; long-term Mechanical ventilation as long as he was able to cognate; ok for long term artificial nutrition. Precondition being able to cognate and not to have too much pain.    Past Surgical History  Procedure Laterality Date  . Hemorrhoid surgery    . Vitrectomy    . Intraocular lens insertion Bilateral   . Leg surgery Bilateral   . Knee surgery Right   . Traeculectomy Left     eye  . Shoulder arthroscopy  08/30/2011    Procedure: ARTHROSCOPY SHOULDER;  Surgeon: [name] MD;  [facility]  Service: Orthopedics;  Laterality: Right;  Right Shoulder Arthroscopy, Debridement, and Decompression  . Tonsillectomy    . Colonoscopy    . Cholecystectomy    . Toe amputation    . Colonoscopy [name] 10/30/2012    Procedure: COLONOSCOPY;  Surgeon: CGatha Mayer MD;  Location: [facility];  Service: Endoscopy;  Laterality: [name];  . Cardiac catheterization  1997    Non-obstructive disease  . Cardiac catheterization  12/24/2012    mild < 20% LCx, prox 30% RCA; LVEF 55-65% , moderate pulmonary HTN, moderate AS  . Eye surgery Bilateral cataract  . 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 HYPERCHOLESTEROLEMIA-PURE   . GOUT   . OBSTRUCTIVE SLEEP APNEA   . Essential hypertension, benign   . VENOUS INSUFFICIENCY   . ASTHMA   . COPD   . VENTRAL HERNIA   . OSTEOARTHRITIS   . CATARACT, HX OF   . PULMONARY HYPERTENSION, HX OF   . Hallux valgus (acquired)   . Obesity, unspecified   . Osteoarthrosis, unspecified whether  generalized or localized, unspecified site   . Unspecified glaucoma   . Moderate aortic stenosis     Per 12/2012 echo- (VTI 1.08 cm^2, Vmax 1.11 cm ^2), mild LA dilatation  . Paroxysmal atrial fibrillation 2003  . Lumbosacral neuritis   . Unspecified hereditary and idiopathic peripheral neuropathy approx age 79    bilat LE's, ? left arm, too.  Feet became progressively numb + left foot pain intermittently.  . Lumbosacral spondylosis   . CAD (coronary artery disease)     Nonobstructive by 12/2012 cath  . PUD (peptic ulcer disease)   . BPH (benign prostatic hypertrophy)   . HH (hiatus hernia)   . Glaucoma   . GERD (gastroesophageal reflux disease)   . Personal history of colonic adenoma 10/30/2012  . Shortness of breath     with exertion  . Peripheral neuropathy     Heredetary; with chronic neuropathic pain   BP 159/60  Pulse 89  Resp 14  Ht 6' 2"  (1.88 m)  Wt 360 lb (163.295 kg)  BMI 46.20 kg/m2  SpO2 92%  Opioid Risk Score:   Fall Risk Score: Moderate Fall Risk (6-13 points) (pt educated declined handout)    Review of Systems  Constitutional: Positive for chills and unexpected weight change.  Respiratory: Positive for apnea and shortness of breath.   Gastrointestinal: Positive for vomiting and diarrhea.  Musculoskeletal: Positive for back pain and gait problem.  Neurological: Positive for weakness and numbness.  All other systems reviewed and are negative.      Objective:   Physical Exam  Nursing note and vitals reviewed. Constitutional: He is oriented to person, place, and time. He appears well-developed and well-nourished.  HENT:  Head: Normocephalic and atraumatic.  Neck: Normal range of motion. Neck supple.  Cardiovascular: Normal rate and regular rhythm.   Pulmonary/Chest: Effort normal and breath sounds normal.  Musculoskeletal:  Normal Muscle Bulk and Muscle testing Reveals:  Upper extremities: Full ROM and Muscle Strength 5/5 On the Right and Left  4/5 Lower Extremities: Full ROM and Muscle strength 5/5 Arises from chair with ease Narrow based gait Uses straight cane for support  Neurological: He is alert and oriented to person, place, and time.  Skin: Skin is warm and dry.  Psychiatric: He has a normal mood and affect.          Assessment & Plan:  1. 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 Will Continue to Monitor.  Refilled: Percocet 10/325 mg Increased to  # 80 pills---use one every 8 hours as needed for pain and Continue Tramadol 37.5/325mg one tablet every 6 hours as needed. Continue with Gabapentin.  2.  DDD lumbar spine with LBP: Stable at this time. Continue current pain regimen. Encourage increase activity and heat therapy.  3. Insomnia: Increased Elavil.   20 minutes of face to face patient care time was spent during this visit. All questions were encouraged and answered.   F/U in 1 month

## 2014-02-27 ENCOUNTER — Other Ambulatory Visit: Payer: Self-pay | Admitting: Family Medicine

## 2014-02-27 MED ORDER — LOSARTAN POTASSIUM 50 MG PO TABS: 50.0000 mg | ORAL_TABLET | Freq: Every day | ORAL | Status: DC

## 2014-02-27 MED ORDER — POTASSIUM CHLORIDE CRYS ER 20 MEQ PO TBCR: 20.0000 meq | EXTENDED_RELEASE_TABLET | Freq: Every day | ORAL | Status: DC

## 2014-03-04 ENCOUNTER — Other Ambulatory Visit: Payer: Self-pay | Admitting: Family Medicine

## 2014-03-04 DIAGNOSIS — R0600 Dyspnea, unspecified: Secondary | ICD-10-CM

## 2014-03-04 DIAGNOSIS — I2510 Atherosclerotic heart disease of native coronary artery without angina pectoris: Secondary | ICD-10-CM

## 2014-03-04 MED ORDER — POTASSIUM CHLORIDE CRYS ER 20 MEQ PO TBCR: 20.0000 meq | EXTENDED_RELEASE_TABLET | Freq: Every day | ORAL | Status: DC

## 2014-03-10 ENCOUNTER — Other Ambulatory Visit: Payer: Self-pay | Admitting: Family Medicine

## 2014-03-10 MED ORDER — POTASSIUM CHLORIDE CRYS ER 20 MEQ PO TBCR: 20.0000 meq | EXTENDED_RELEASE_TABLET | Freq: Every day | ORAL | Status: DC

## 2014-03-11 ENCOUNTER — Ambulatory Visit (INDEPENDENT_AMBULATORY_CARE_PROVIDER_SITE_OTHER): Payer: Medicare Other | Admitting: Cardiology

## 2014-03-11 ENCOUNTER — Encounter: Payer: Self-pay | Admitting: Cardiology

## 2014-03-11 VITALS — BP 110/60 | HR 83 | Ht 74.0 in | Wt 336.1 lb

## 2014-03-11 DIAGNOSIS — I350 Nonrheumatic aortic (valve) stenosis: Secondary | ICD-10-CM

## 2014-03-11 DIAGNOSIS — R0989 Other specified symptoms and signs involving the circulatory and respiratory systems: Secondary | ICD-10-CM

## 2014-03-11 DIAGNOSIS — R0609 Other forms of dyspnea: Secondary | ICD-10-CM

## 2014-03-11 DIAGNOSIS — I4891 Unspecified atrial fibrillation: Secondary | ICD-10-CM

## 2014-03-11 DIAGNOSIS — E7800 Pure hypercholesterolemia, unspecified: Secondary | ICD-10-CM

## 2014-03-11 DIAGNOSIS — I480 Paroxysmal atrial fibrillation: Secondary | ICD-10-CM

## 2014-03-11 DIAGNOSIS — I10 Essential (primary) hypertension: Secondary | ICD-10-CM

## 2014-03-11 DIAGNOSIS — I359 Nonrheumatic aortic valve disorder, unspecified: Secondary | ICD-10-CM

## 2014-03-11 MED ORDER — METOPROLOL SUCCINATE ER 100 MG PO TB24: 100.0000 mg | ORAL_TABLET | Freq: Every day | ORAL | Status: DC

## 2014-03-11 NOTE — Assessment & Plan Note (Addendum)
Patient is complaining of orthostatic symptoms. His blood pressure lying today is 133/78 with a pulse of 69. Standing he is 110/60 with a pulse of 83. Lasix has been discontinued. Decreased Toprol to 50 mg daily. Hopefully his symptoms will improve with the above measures.

## 2014-03-11 NOTE — Assessment & Plan Note (Signed)
Continue statin. Not on aspirin given need for anticoagulation.

## 2014-03-11 NOTE — Assessment & Plan Note (Signed)
Moderate on most recent echocardiogram.

## 2014-03-11 NOTE — Assessment & Plan Note (Signed)
Continue statin. 

## 2014-03-11 NOTE — Assessment & Plan Note (Signed)
Patient has multiple reasons to be short of breath. These include deconditioning, obesity hypoventilation syndrome, obstructive sleep apnea and diastolic congestive heart failure. He is not volume overloaded on examination. Continue off of Lasix given orthostatic symptoms. Continue CPAP. Note his aortic stenosis remains moderate on echocardiogram and unchanged compared to one year ago.

## 2014-03-11 NOTE — Patient Instructions (Signed)
DECREASE METOPROLOL TO 100 MG DAILY; NEW RX WAS SENT IN TODAY  PLEASE FOLLOW UP WITH DR. CRENSHAW ON 04/22/14 AT 3 PM

## 2014-03-11 NOTE — Assessment & Plan Note (Signed)
Continue beta blocker and apixaban.

## 2014-03-11 NOTE — Progress Notes (Signed)
HPI: FU paroxysmal atrial fibrillation, sleep apnea, and pedal edema. Admitted with CP June of 2014 and ruled out. Carotid Dopplers in June of 2014 showed no significant stenosis. Cardiac catheterization in June of 2014 showed a pulmonary artery pressure of 51/26 and pulmonary capillary wedge pressure of 20. Aortic valve area of 1.4 cm. There was a 20% circumflex, 30% right coronary artery. There was no disease in the LAD. Ejection fraction 55-65%. Medical therapy recommended. Echocardiogram performed in June 2014 showed moderate aortic stenosis with a mean gradient of 27 mm of mercury. There was mild left atrial enlargement and trace mitral regurgitation. There is no mention in the report of LV function. Echocardiogram repeated in July 2015. Ejection fraction normal. Grade 1 diastolic dysfunction. Moderate aortic stenosis with a mean gradient of 28 mm of mercury. Trace aortic insufficiency. Mild biatrial enlargement and mild right ventricular enlargement. Chest CT July 2015 showed no acute disease. At last office visit he was complaining of increased dyspnea. We resumed his Lasix. Since last seen, He initially was improving. However he discontinued his Lasix after 3 days because of incontinence. He apparently was seen at his primary care physician's office and was having low blood pressure with associated dizziness. His Toprol was decreased and his Cozaar was discontinued. He continues to describe spells of dizziness. These are predominantly with changing positions but occasionally without. Last 2 minutes to 10 minutes. No associated chest pain or palpitations. No syncope. He describes weakness on exertion and dyspnea on exertion.   Current Outpatient Prescriptions  Medication Sig Dispense Refill  . albuterol (PROVENTIL HFA;VENTOLIN HFA) 108 (90 BASE) MCG/ACT inhaler Inhale 2 puffs into the lungs 4 (four) times daily as needed for wheezing or shortness of breath.      . allopurinol (ZYLOPRIM) 300  MG tablet take 1 tablet by mouth once daily  30 tablet  3  . amitriptyline (ELAVIL) 100 MG tablet Take 1 tablet (100 mg total) by mouth at bedtime.  30 tablet  2  . apixaban (ELIQUIS) 5 MG TABS tablet Take 1 tablet (5 mg total) by mouth 2 (two) times daily.  60 tablet  6  . clotrimazole-betamethasone (LOTRISONE) cream Apply 1 application topically 2 (two) times daily.  45 g  3  . finasteride (PROSCAR) 5 MG tablet take 1 tablet by mouth once daily  90 tablet  1  . Fluticasone-Salmeterol (ADVAIR) 250-50 MCG/DOSE AEPB Inhale 1 puff into the lungs daily as needed (for nasal congestion).       . furosemide (LASIX) 20 MG tablet Take 3 tablets once daily  90 tablet  12  . gabapentin (NEURONTIN) 600 MG tablet take 1 tablet by mouth four times a day AFTER MEALS AND AT BEDTIME  120 tablet  2  . metoprolol (TOPROL-XL) 200 MG 24 hr tablet take 1 tablet by mouth once daily      . Multiple Vitamin (MULTIVITAMIN WITH MINERALS) TABS Take 1 tablet by mouth daily.      . niacin (NIASPAN) 1000 MG CR tablet Take 1 tablet (1,000 mg total) by mouth 2 (two) times daily.  60 tablet  3  . omeprazole (PRILOSEC) 10 MG capsule take 1 capsule by mouth once daily  30 capsule  11  . OVER THE COUNTER MEDICATION OTC stool softner, takes 3 daily.      [name] oxyCODONE-acetaminophen (PERCOCET) 10-325 MG per tablet Take 1 tablet by mouth every 8 (eight) hours as needed for pain.  80 tablet  0  .  potassium chloride SA (K-DUR,KLOR-CON) 20 MEQ tablet Take 1 tablet (20 mEq total) by mouth daily.  90 tablet  1  . rosuvastatin (CRESTOR) 40 MG tablet Take 1 tablet (40 mg total) by mouth daily.  90 tablet  3  . Thiamine HCl (VITAMIN B-1) 250 MG tablet Take 250 mg by mouth every evening.      . traMADol-acetaminophen (ULTRACET) 37.5-325 MG per tablet take 1 tablet by mouth every 6 hours if needed for pain - MUST LAST 30 DAYS  120 tablet  3  . vitamin C (ASCORBIC ACID) 250 MG tablet Take 250 mg by mouth every evening.      [name] ZETIA 10 MG tablet  take 1 tablet by mouth once daily  30 tablet  5   No current facility-administered medications for this visit.     Past Medical History  Diagnosis Date  . HYPERCHOLESTEROLEMIA-PURE   . GOUT   . OBSTRUCTIVE SLEEP APNEA   . Essential hypertension, benign   . VENOUS INSUFFICIENCY   . ASTHMA   . COPD   . VENTRAL HERNIA   . OSTEOARTHRITIS   . CATARACT, HX OF   . PULMONARY HYPERTENSION, HX OF   . Hallux valgus (acquired)   . Obesity, unspecified   . Osteoarthrosis, unspecified whether generalized or localized, unspecified site   . Unspecified glaucoma   . Moderate aortic stenosis     Per 12/2012 echo- (VTI 1.08 cm^2, Vmax 1.11 cm ^2), mild LA dilatation  . Paroxysmal atrial fibrillation 2003  . Lumbosacral neuritis   . Unspecified hereditary and idiopathic peripheral neuropathy approx age 73    bilat LE's, ? left arm, too.  Feet became progressively numb + left foot pain intermittently.  . Lumbosacral spondylosis   . CAD (coronary artery disease)     Nonobstructive by 12/2012 cath  . PUD (peptic ulcer disease)   . BPH (benign prostatic hypertrophy)   . HH (hiatus hernia)   . Glaucoma   . GERD (gastroesophageal reflux disease)   . Personal history of colonic adenoma 10/30/2012  . Shortness of breath     with exertion  . Peripheral neuropathy     Heredetary; with chronic neuropathic pain    Past Surgical History  Procedure Laterality Date  . Hemorrhoid surgery    . Vitrectomy    . Intraocular lens insertion Bilateral   . Leg surgery Bilateral   . Knee surgery Right   . Traeculectomy Left     eye  . Shoulder arthroscopy  08/30/2011    Procedure: ARTHROSCOPY SHOULDER;  Surgeon: [name], MD;  Location: [place];  Service: Orthopedics;  Laterality: Right;  Right Shoulder Arthroscopy, Debridement, and Decompression  . Tonsillectomy    . Colonoscopy    . Cholecystectomy    . Toe amputation    . Colonoscopy [name] 10/30/2012    Procedure: COLONOSCOPY;  Surgeon: [name], MD;  Location: [facility];  Service: Endoscopy;  Laterality: [name];  . Cardiac catheterization  1997    Non-obstructive disease  . Cardiac catheterization  12/24/2012    mild < 20% LCx, prox 30% RCA; LVEF 55-65% , moderate pulmonary HTN, moderate AS  . Eye surgery Bilateral cataract  . Amputation Left 04/11/2013    Procedure: AMPUTATION DIGIT Left 3rd toe;  Surgeon: [name], MD;  Location: [place];  Service: Orthopedics;  Laterality: Left;  Left 3rd toe amputation at [facility]    History   Social History  . Marital Status: Married  Spouse Name: [name]    Number of Children: 0  . Years of Education: 20   Occupational History  . engineering     retired   Social History Main Topics  . Smoking status: Never Smoker   . Smokeless tobacco: Never Used  . Alcohol Use: No  . Drug Use: Yes    Special: Oxycodone  . Sexual Activity: Not Currently   Other Topics Concern  . Not on file   Social History Narrative   HSG, [name] - BS, [name] - MS-engineering, 2 years on PhD - [employer]. Married - '65 - 72yr/divorced; '76- 3 yrs/divorced; '92 . No children. 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 ACP/Living Will - Yes CPR; long-term Mechanical ventilation as long as he was able to cognate; ok for long term artificial nutrition. Precondition being able to cognate and not to have too much pain.     ROS: no fevers or chills, productive cough, hemoptysis, dysphasia, odynophagia, melena, hematochezia, dysuria, hematuria, rash, seizure activity, orthopnea, PND, pedal edema, claudication. Remaining systems are negative.  Physical Exam: Well-developed morbidly obese in no acute distress.  Skin is warm and dry.  HEENT is normal.  Neck is supple.  Chest is clear to auscultation with normal expansion.  Cardiovascular exam is regular rate and rhythm. 2/6 systolic murmur Abdominal exam nontender or distended. No masses palpated. Extremities show no  edema. neuro grossly intact

## 2014-03-19 ENCOUNTER — Ambulatory Visit (INDEPENDENT_AMBULATORY_CARE_PROVIDER_SITE_OTHER): Payer: Medicare Other | Admitting: Family Medicine

## 2014-03-19 ENCOUNTER — Encounter: Payer: Self-pay | Admitting: Family Medicine

## 2014-03-19 VITALS — BP 121/78 | HR 82 | Temp 97.8°F | Resp 18 | Ht 73.0 in | Wt 355.0 lb

## 2014-03-19 DIAGNOSIS — Z23 Encounter for immunization: Secondary | ICD-10-CM

## 2014-03-19 MED ORDER — CLOTRIMAZOLE-BETAMETHASONE 1-0.05 % EX CREA: 1.0000 "application " | TOPICAL_CREAM | Freq: Two times a day (BID) | CUTANEOUS | Status: DC

## 2014-03-19 MED ORDER — TEMAZEPAM 15 MG PO CAPS: ORAL_CAPSULE | ORAL | Status: DC

## 2014-03-19 MED ORDER — METOPROLOL SUCCINATE ER 50 MG PO TB24: 50.0000 mg | ORAL_TABLET | Freq: Every day | ORAL | Status: DC

## 2014-03-19 NOTE — Progress Notes (Signed)
OFFICE NOTE  03/19/2014  CC:  Chief Complaint  Patient presents with  . Follow-up  . Dizziness     HPI: Patient is a 73 y.o. Caucasian male who is here for 1 mo f/u HTN, orthostatic sx's, generalized weakness in LE's--chronic. Last visit I referred him to a neurologist after I did an MRI and it showed lumbar spondylosis.  I wanted their opinion on whether or not his LE sx's could possibly be coming from this problem: this visit is set for October.  His heart MD recently decreased his toprol from 200 to 100 qd and pt says he doesn't think this has changed his dizziness sx's any.  Chronic night-time left foot pain for which he has taken oxycodone long term via Dr. Letta Pate.  Also on gabapentin and amitriptyline for this.  Says the left foot pain keeps him up at night.    Pertinent PMH:  Past medical, surgical, social, and family history reviewed and no changes are noted since last office visit.  MEDS:  Outpatient Prescriptions Prior to Visit  Medication Sig Dispense Refill  . albuterol (PROVENTIL HFA;VENTOLIN HFA) 108 (90 BASE) MCG/ACT inhaler Inhale 2 puffs into the lungs 4 (four) times daily as needed for wheezing or shortness of breath.      . allopurinol (ZYLOPRIM) 300 MG tablet take 1 tablet by mouth once daily  30 tablet  3  . amitriptyline (ELAVIL) 100 MG tablet Take 1 tablet (100 mg total) by mouth at bedtime.  30 tablet  2  . apixaban (ELIQUIS) 5 MG TABS tablet Take 1 tablet (5 mg total) by mouth 2 (two) times daily.  60 tablet  6  . clotrimazole-betamethasone (LOTRISONE) cream Apply 1 application topically 2 (two) times daily.  45 g  3  . finasteride (PROSCAR) 5 MG tablet take 1 tablet by mouth once daily  90 tablet  1  . Fluticasone-Salmeterol (ADVAIR) 250-50 MCG/DOSE AEPB Inhale 1 puff into the lungs daily as needed (for nasal congestion).       . gabapentin (NEURONTIN) 600 MG tablet take 1 tablet by mouth four times a day AFTER MEALS AND AT BEDTIME  120 tablet  2  .  metoprolol (TOPROL-XL) 100 MG 24 hr tablet Take 1 tablet (100 mg total) by mouth daily. take 1 tablet by mouth once daily  30 tablet  11  . Multiple Vitamin (MULTIVITAMIN WITH MINERALS) TABS Take 1 tablet by mouth daily.      . niacin (NIASPAN) 1000 MG CR tablet Take 1 tablet (1,000 mg total) by mouth 2 (two) times daily.  60 tablet  3  . omeprazole (PRILOSEC) 10 MG capsule take 1 capsule by mouth once daily  30 capsule  11  . OVER THE COUNTER MEDICATION OTC stool softner, takes 3 daily.      [name] oxyCODONE-acetaminophen (PERCOCET) 10-325 MG per tablet Take 1 tablet by mouth every 8 (eight) hours as needed for pain.  80 tablet  0  . potassium chloride SA (K-DUR,KLOR-CON) 20 MEQ tablet Take 1 tablet (20 mEq total) by mouth daily.  90 tablet  1  . rosuvastatin (CRESTOR) 40 MG tablet Take 1 tablet (40 mg total) by mouth daily.  90 tablet  3  . Thiamine HCl (VITAMIN B-1) 250 MG tablet Take 250 mg by mouth every evening.      . traMADol-acetaminophen (ULTRACET) 37.5-325 MG per tablet take 1 tablet by mouth every 6 hours if needed for pain - MUST LAST 30 DAYS  120 tablet  3  . vitamin C (ASCORBIC ACID) 250 MG tablet Take 250 mg by mouth every evening.      [name] ZETIA 10 MG tablet take 1 tablet by mouth once daily  30 tablet  5  . furosemide (LASIX) 20 MG tablet Take 3 tablets once daily  90 tablet  12   No facility-administered medications prior to visit.    PE: Blood pressure 121/78, pulse 82, temperature 97.8 F (36.6 C), temperature source Temporal, resp. rate 18, height 6' 1"  (1.854 m), weight 355 lb (161.027 kg), SpO2 98.00%. Gen: Alert, well appearing.  Patient is oriented to person, place, time, and situation. AFFECT: pleasant, lucid thought and speech. CV: RRR, distant S1 and S2, question of soft systolic murmur at base. Chest is clear, no wheezing or rales. Normal symmetric air entry throughout both lung fields. No chest wall deformities or tenderness. EXT: bilat doughy-type edema with chronic  venous stasis pigment changes in anterior tibial regions, 1 + pitting edema bilat.   IMPRESSION AND PLAN:  1) Dizziness, orthostatic.   Change Toprol XL from 130m qd to 530mqd.  2) Chronic LE generalized weakness: unchanged. Neuro eval pending.  3) Chronic pain syndrome: primarily left foot is giving him problems with insomnia: ambien caused bad dreams in the past.  No other sleep aid has been tried per pt. Decided on trial of restoril today: start low and titrate up for effect (start with 1/2 of 1535mab qhs and increase by 1/2 tab q3d until effect is appreciated--max of 2 tabs qhs).  Therapeutic expectations and side effect profile of medication discussed today.  Patient's questions answered. (Initial restoril rx shredded today b/c of error on dispensation #--I put 30 instead of 60).  4) Prev health care: Flu vaccine IM today.  FOLLOW UP:  2 mo, f/u dizziness and LE weakness

## 2014-03-19 NOTE — Patient Instructions (Signed)
Restoril start with 1/2 of 54m tab qhs and increase by 1/2 tab q3d until effect is appreciated--max of 2 tabs qhs).

## 2014-03-19 NOTE — Progress Notes (Signed)
Pre visit review using our clinic review tool, if applicable. No additional management support is needed unless otherwise documented below in the visit note. 

## 2014-03-27 ENCOUNTER — Ambulatory Visit (HOSPITAL_BASED_OUTPATIENT_CLINIC_OR_DEPARTMENT_OTHER): Payer: Medicare Other | Admitting: Physical Medicine & Rehabilitation

## 2014-03-27 ENCOUNTER — Emergency Department (HOSPITAL_COMMUNITY)
Admission: EM | Admit: 2014-03-27 | Discharge: 2014-03-27 | Disposition: A | Discharge status: Home / Self Care | Payer: Medicare Other | Attending: Emergency Medicine | Admitting: Emergency Medicine

## 2014-03-27 ENCOUNTER — Encounter (HOSPITAL_COMMUNITY): Payer: Self-pay | Admitting: Emergency Medicine

## 2014-03-27 ENCOUNTER — Encounter: Payer: Medicare Other | Attending: Registered Nurse

## 2014-03-27 ENCOUNTER — Encounter: Payer: Self-pay | Admitting: Physical Medicine & Rehabilitation

## 2014-03-27 VITALS — BP 151/72 | HR 96 | Resp 16 | Wt 355.0 lb

## 2014-03-27 DIAGNOSIS — Z8669 Personal history of other diseases of the nervous system and sense organs: Secondary | ICD-10-CM | POA: Insufficient documentation

## 2014-03-27 DIAGNOSIS — I2510 Atherosclerotic heart disease of native coronary artery without angina pectoris: Secondary | ICD-10-CM | POA: Insufficient documentation

## 2014-03-27 DIAGNOSIS — I951 Orthostatic hypotension: Secondary | ICD-10-CM | POA: Diagnosis not present

## 2014-03-27 DIAGNOSIS — S98139A Complete traumatic amputation of one unspecified lesser toe, initial encounter: Secondary | ICD-10-CM | POA: Insufficient documentation

## 2014-03-27 DIAGNOSIS — N400 Enlarged prostate without lower urinary tract symptoms: Secondary | ICD-10-CM | POA: Insufficient documentation

## 2014-03-27 DIAGNOSIS — G8929 Other chronic pain: Secondary | ICD-10-CM | POA: Diagnosis present

## 2014-03-27 DIAGNOSIS — Z5181 Encounter for therapeutic drug level monitoring: Secondary | ICD-10-CM | POA: Diagnosis not present

## 2014-03-27 DIAGNOSIS — M7989 Other specified soft tissue disorders: Secondary | ICD-10-CM | POA: Insufficient documentation

## 2014-03-27 DIAGNOSIS — Z8711 Personal history of peptic ulcer disease: Secondary | ICD-10-CM | POA: Insufficient documentation

## 2014-03-27 DIAGNOSIS — Z860100 Personal history of colon polyps, unspecified: Secondary | ICD-10-CM | POA: Insufficient documentation

## 2014-03-27 DIAGNOSIS — G4733 Obstructive sleep apnea (adult) (pediatric): Secondary | ICD-10-CM | POA: Insufficient documentation

## 2014-03-27 DIAGNOSIS — IMO0002 Reserved for concepts with insufficient information to code with codable children: Secondary | ICD-10-CM | POA: Diagnosis not present

## 2014-03-27 DIAGNOSIS — M1990 Unspecified osteoarthritis, unspecified site: Secondary | ICD-10-CM | POA: Insufficient documentation

## 2014-03-27 DIAGNOSIS — Z7902 Long term (current) use of antithrombotics/antiplatelets: Secondary | ICD-10-CM | POA: Insufficient documentation

## 2014-03-27 DIAGNOSIS — M5137 Other intervertebral disc degeneration, lumbosacral region: Secondary | ICD-10-CM

## 2014-03-27 DIAGNOSIS — I10 Essential (primary) hypertension: Secondary | ICD-10-CM | POA: Insufficient documentation

## 2014-03-27 DIAGNOSIS — I959 Hypotension, unspecified: Secondary | ICD-10-CM | POA: Insufficient documentation

## 2014-03-27 DIAGNOSIS — E669 Obesity, unspecified: Secondary | ICD-10-CM | POA: Diagnosis not present

## 2014-03-27 DIAGNOSIS — G609 Hereditary and idiopathic neuropathy, unspecified: Secondary | ICD-10-CM | POA: Diagnosis not present

## 2014-03-27 DIAGNOSIS — Z79899 Other long term (current) drug therapy: Secondary | ICD-10-CM | POA: Insufficient documentation

## 2014-03-27 DIAGNOSIS — E7800 Pure hypercholesterolemia, unspecified: Secondary | ICD-10-CM | POA: Insufficient documentation

## 2014-03-27 DIAGNOSIS — J4489 Other specified chronic obstructive pulmonary disease: Secondary | ICD-10-CM | POA: Insufficient documentation

## 2014-03-27 DIAGNOSIS — I4891 Unspecified atrial fibrillation: Secondary | ICD-10-CM | POA: Diagnosis not present

## 2014-03-27 DIAGNOSIS — R5383 Other fatigue: Secondary | ICD-10-CM | POA: Diagnosis not present

## 2014-03-27 DIAGNOSIS — M5136 Other intervertebral disc degeneration, lumbar region: Secondary | ICD-10-CM

## 2014-03-27 DIAGNOSIS — R011 Cardiac murmur, unspecified: Secondary | ICD-10-CM | POA: Insufficient documentation

## 2014-03-27 DIAGNOSIS — Z9889 Other specified postprocedural states: Secondary | ICD-10-CM | POA: Diagnosis not present

## 2014-03-27 DIAGNOSIS — M47817 Spondylosis without myelopathy or radiculopathy, lumbosacral region: Secondary | ICD-10-CM | POA: Insufficient documentation

## 2014-03-27 DIAGNOSIS — G608 Other hereditary and idiopathic neuropathies: Secondary | ICD-10-CM

## 2014-03-27 DIAGNOSIS — R5381 Other malaise: Secondary | ICD-10-CM | POA: Insufficient documentation

## 2014-03-27 DIAGNOSIS — J449 Chronic obstructive pulmonary disease, unspecified: Secondary | ICD-10-CM | POA: Insufficient documentation

## 2014-03-27 LAB — CBC WITH DIFFERENTIAL/PLATELET
BASOS PCT: 0 % (ref 0–1)
Basophils Absolute: 0 10*3/uL (ref 0.0–0.1)
EOS ABS: 0.1 10*3/uL (ref 0.0–0.7)
Eosinophils Relative: 1 % (ref 0–5)
HCT: 38.3 % — ABNORMAL LOW (ref 39.0–52.0)
Hemoglobin: 12.8 g/dL — ABNORMAL LOW (ref 13.0–17.0)
Lymphocytes Relative: 20 % (ref 12–46)
Lymphs Abs: 1.7 10*3/uL (ref 0.7–4.0)
MCH: 30.1 pg (ref 26.0–34.0)
MCHC: 33.4 g/dL (ref 30.0–36.0)
MCV: 90.1 fL (ref 78.0–100.0)
Monocytes Absolute: 1 10*3/uL (ref 0.1–1.0)
Monocytes Relative: 12 % (ref 3–12)
NEUTROS PCT: 67 % (ref 43–77)
Neutro Abs: 5.9 10*3/uL (ref 1.7–7.7)
Platelets: 160 10*3/uL (ref 150–400)
RBC: 4.25 MIL/uL (ref 4.22–5.81)
RDW: 13.3 % (ref 11.5–15.5)
WBC: 8.7 10*3/uL (ref 4.0–10.5)

## 2014-03-27 LAB — COMPREHENSIVE METABOLIC PANEL
ALBUMIN: 3.8 g/dL (ref 3.5–5.2)
ALT: 36 U/L (ref 0–53)
ANION GAP: 13 (ref 5–15)
AST: 31 U/L (ref 0–37)
Alkaline Phosphatase: 74 U/L (ref 39–117)
BUN: 18 mg/dL (ref 6–23)
CO2: 26 mEq/L (ref 19–32)
Calcium: 9.4 mg/dL (ref 8.4–10.5)
Chloride: 99 mEq/L (ref 96–112)
Creatinine, Ser: 0.81 mg/dL (ref 0.50–1.35)
GFR calc Af Amer: >90 mL/min (ref >90)
GFR calc non Af Amer: 85 mL/min — ABNORMAL LOW (ref >90)
Glucose, Bld: 122 mg/dL — ABNORMAL HIGH (ref 70–99)
POTASSIUM: 4.3 meq/L (ref 3.7–5.3)
Sodium: 138 mEq/L (ref 137–147)
Total Bilirubin: 0.5 mg/dL (ref 0.3–1.2)
Total Protein: 7.2 g/dL (ref 6.0–8.3)

## 2014-03-27 MED ORDER — TRAMADOL-ACETAMINOPHEN 37.5-325 MG PO TABS: ORAL_TABLET | ORAL | Status: DC

## 2014-03-27 MED ORDER — OXYCODONE-ACETAMINOPHEN 10-325 MG PO TABS: 1.0000 | ORAL_TABLET | Freq: Three times a day (TID) | ORAL | Status: DC | PRN

## 2014-03-27 NOTE — Progress Notes (Signed)
Subjective:    Patient ID: Tim [name], male    DOB: January 12, 1941, 73 y.o.   MRN: [id number]  HPI Feeling dizzy he has felt this way throughout the day. Has had intermittent problems over the last week or so. Only medication change recently was increasing amitriptyline to 100 mg about 4 weeks ago, had been on 75 mg prior to that time without side effects.  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 General activity 3 Relation with others 6 Enjoyment of life 5 What TIME of day is your pain at its worst? night Sleep (in general) Fair  Pain is worse with: walking, bending and standing Pain improves with: rest, pacing activities, medication and injections Relief from Meds: 6  Mobility use a cane use a walker how many minutes can you walk? 5 ability to climb steps?  no do you drive?  yes  Function I need assistance with the following:  meal prep, household duties and shopping  Neuro/Psych weakness tremor trouble walking dizziness confusion  Prior Studies Any changes since last visit?  no  Physicians involved in your care Any changes since last visit?  no   Family History  Problem Relation Age of Onset  . Hypertension Mother   . Coronary artery disease Mother   . Heart attack Mother   . Pulmonary fibrosis Father     asbestosis   History   Social History  . Marital Status: Married    Spouse Name: [name]    Number of Children: 0  . Years of Education: 20   Occupational History  . engineering     retired   Social History Main Topics  . Smoking status: Never Smoker   . Smokeless tobacco: Never Used  . Alcohol Use: No  . Drug Use: Yes    Special: Oxycodone  . Sexual  Activity: Not Currently   Other Topics Concern  . None   Social History Narrative   HSG, [name] - BS, [name] - MS-engineering, 2 years on PhD - [name]. Married - '65 - 35yr/divorced; '76- 3 yrs/divorced; '92 . No children. 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 ACP/Living Will - Yes CPR; long-term Mechanical ventilation as long as he was able to cognate; ok for long term artificial nutrition. Precondition being able to cognate and not to have too much pain.    Past Surgical History  Procedure Laterality Date  . Hemorrhoid surgery    . Vitrectomy    . Intraocular lens insertion Bilateral   . Leg surgery Bilateral   . Knee surgery Right   . Traeculectomy Left     eye  . Shoulder arthroscopy  08/30/2011    Procedure: ARTHROSCOPY SHOULDER;  Surgeon: [name] MD;  [place]  Service: Orthopedics;  Laterality: Right;  Right Shoulder Arthroscopy, Debridement, and Decompression  . Tonsillectomy    . Colonoscopy    . Cholecystectomy    . Toe amputation    . Colonoscopy [name] 10/30/2012    Procedure: COLONOSCOPY;  Surgeon: CGatha Mayer MD;  Location: [facility];  Service: Endoscopy;  Laterality: [name];  . Cardiac catheterization  1997    Non-obstructive disease  .  Cardiac catheterization  12/24/2012    mild < 20% LCx, prox 30% RCA; LVEF 55-65% , moderate pulmonary HTN, moderate AS  . Eye surgery Bilateral cataract  . 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 HYPERCHOLESTEROLEMIA-PURE   . GOUT   . OBSTRUCTIVE SLEEP APNEA   . Essential hypertension, benign   . VENOUS INSUFFICIENCY   . ASTHMA   . COPD   . VENTRAL HERNIA   . OSTEOARTHRITIS   . CATARACT, HX OF   . PULMONARY HYPERTENSION, HX OF   . Hallux valgus (acquired)   . Obesity,  unspecified   . Osteoarthrosis, unspecified whether generalized or localized, unspecified site   . Unspecified glaucoma   . Moderate aortic stenosis     Per 12/2012 echo- (VTI 1.08 cm^2, Vmax 1.11 cm ^2), mild LA dilatation  . Paroxysmal atrial fibrillation 2003  . Lumbosacral neuritis   . Unspecified hereditary and idiopathic peripheral neuropathy approx age 70    bilat LE's, ? left arm, too.  Feet became progressively numb + left foot pain intermittently.  . Lumbosacral spondylosis   . CAD (coronary artery disease)     Nonobstructive by 12/2012 cath  . PUD (peptic ulcer disease)   . BPH (benign prostatic hypertrophy)   . HH (hiatus hernia)   . Glaucoma   . GERD (gastroesophageal reflux disease)   . Personal history of colonic adenoma 10/30/2012  . Shortness of breath     with exertion  . Peripheral neuropathy     Heredetary; with chronic neuropathic pain   BP 151/72  Pulse 96  Resp 16  Wt 355 lb (161.027 kg)  SpO2 99%  Opioid Risk Score:   Fall Risk Score: Moderate Fall Risk (6-13 points) (previously educated and declined handout)  Review of Systems  Respiratory: Positive for apnea and shortness of breath.   Musculoskeletal: Positive for gait problem.  Neurological: Positive for dizziness, tremors and weakness.  Psychiatric/Behavioral: Positive for confusion.  All other systems reviewed and are negative.      Objective:   Physical Exam General no acute distress Mood and affect are appropriate When patient went from sit to stand he complained of dizziness and had to sit down again.  Lumbar spine has no tenderness palpation thoracic spine without tenderness palpation  Lower extremities strength 4/5 and hip flexors knee extensors 4 minus in the ankle dorsiflexor plantar flexor  Blood pressure sitting 144/70 down to 112/60 standing       Assessment & Plan:  In 1. 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 Do not think he should get behind the wheel  Will call for transport to [facility] emergency department for further evaluation. This may be a medication side effects, he has not had autonomic neuropathy as part of his hereditary neuropathy up to this point at least. No new blood pressure medications but has had increased dose of amitriptyline about a month ago

## 2014-03-27 NOTE — Discharge Instructions (Signed)
Orthostatic Hypotension Orthostatic hypotension is a sudden drop in blood pressure. It happens when you quickly stand up from a seated or lying position. You may feel dizzy or light-headed. This can last for just a few seconds or for up to a few minutes. It is usually not a serious problem. However, if this happens frequently or gets worse, it can be a sign of something more serious. CAUSES  Different things can cause orthostatic hypotension, including:   Loss of body fluids (dehydration).  Medicines that lower blood pressure.  Sudden changes in posture, such as standing up quickly after you have been sitting or lying down.  Taking too much of your medicine. SIGNS AND SYMPTOMS   Light-headedness or dizziness.   Fainting or near-fainting.   A fast heart rate.   Weakness.   Feeling tired (fatigue).  DIAGNOSIS  Your health care provider may do several things to help diagnose your condition and identify the cause. These may include:   Taking a medical history and doing a physical exam.  Checking your blood pressure. Your health care provider will check your blood pressure when you are:  Lying down.  Sitting.  Standing.  Using tilt table testing. In this test, you lie down on a table that moves from a lying position to a standing position. You will be strapped onto the table. This test monitors your blood pressure and heart rate when you are in different positions. TREATMENT  Treatment will vary depending on the cause. Possible treatments include:   Changing the dosage of your medicines.  Wearing compression stockings on your lower legs.  Standing up slowly after sitting or lying down.  Eating more salt.  Eating frequent, small meals.  In some cases, getting IV fluids.  Taking medicine to enhance fluid retention. HOME CARE INSTRUCTIONS  Only take over-the-counter or prescription medicines as directed by your health care provider.  Follow your health care  provider's instructions for changing the dosage of your current medicines.  Do not stop or adjust your medicine on your own.  Stand up slowly after sitting or lying down. This allows your body to adjust to the different position.  Wear compression stockings as directed.  Eat extra salt as directed.  Do not add extra salt to your diet unless directed to by your health care provider.  Eat frequent, small meals.  Avoid standing suddenly after eating.  Avoid hot showers or excessive heat as directed by your health care provider.  Keep all follow-up appointments. SEEK MEDICAL CARE IF:  You continue to feel dizzy or light-headed after standing.  You feel groggy or confused.  You feel cold, clammy, or sick to your stomach (nauseous).  You have blurred vision.  You feel short of breath. SEEK IMMEDIATE MEDICAL CARE IF:   You faint after standing.  You have chest pain.  You have difficulty breathing.   You lose feeling or movement in your arms or legs.   You have slurred speech or difficulty talking, or you are unable to talk.  MAKE SURE YOU:   Understand these instructions.  Will watch your condition.  Will get help right away if you are not doing well or get worse. Document Released: 06/09/2002 Document Revised: 06/24/2013 Document Reviewed: 04/11/2013 [facility] Patient Information 2015 [place], [place]. This information is not intended to replace advice given to you by your health care provider. Make sure you discuss any questions you have with your health care provider.  YOUR SYMPTOMS ARE LIKELY A SIDE EFFECT  OF YOUR MEDICATION FOR HIGH BLOOD PRESSURE. FOLLOW-UP WITH YOUR CARDIOLOGIST TO DISCUSS ADDITIONAL CHANGES TO YOUR CURRENT TREATMENT REGIMEN FOR HYPERTENSION.

## 2014-03-27 NOTE — ED Notes (Signed)
Pt unable to void at this time. 

## 2014-03-27 NOTE — ED Notes (Signed)
Per EMS-  From urgent care center. 2 weeks ago had BP medication changed-dose lowered d/t decreased BP. (2nd time BP meds were changed). Started having dizziness upon standing 2 weeks ago. BP 147/90 upon sitting, 112/60 upon standing. For EMS- BP 120/70 sitting and 100/60 standing. HR 68-95 regular RR 20. GCS 15. Denies dizziness at this time. No SOB, chest pain. Has hx weakness. No change in weakness recently, only increased dizziness.

## 2014-03-27 NOTE — ED Provider Notes (Signed)
CSN: [id number]     Arrival date & time 03/27/14  1632 History   First MD Initiated Contact with Patient 03/27/14 1712     Chief Complaint  Patient presents with  . Hypotensive      (Consider location/radiation/quality/duration/timing/severity/associated sxs/prior Treatment) HPI Comments: Patient reports long-standing issue with near-syncope, has been evaluated by his PCP and cardiology in the past.  Recent exacerbation of frequency of near-syncopal episodes.  Is current on toprol for hypertension, dosage has been reduced and is currently at 50 mg per day.    Patient seen at rehab today.  Due to orthostasis, patient sent to ED for further evaluation.  Patient is a 73 y.o. male presenting with near-syncope.  Near Syncope This is a recurrent problem. The current episode started more than 1 month ago. The problem occurs 2 to 4 times per day. The problem has been unchanged. Associated symptoms include weakness. Pertinent negatives include no abdominal pain, chest pain, headaches, nausea, visual change or vomiting. The symptoms are aggravated by standing and walking. He has tried lying down for the symptoms. The treatment provided mild relief.    Past Medical History  Diagnosis Date  . HYPERCHOLESTEROLEMIA-PURE   . GOUT   . OBSTRUCTIVE SLEEP APNEA   . Essential hypertension, benign   . VENOUS INSUFFICIENCY   . ASTHMA   . COPD   . VENTRAL HERNIA   . OSTEOARTHRITIS   . CATARACT, HX OF   . PULMONARY HYPERTENSION, HX OF   . Hallux valgus (acquired)   . Obesity, unspecified   . Osteoarthrosis, unspecified whether generalized or localized, unspecified site   . Unspecified glaucoma   . Moderate aortic stenosis     Per 12/2012 echo- (VTI 1.08 cm^2, Vmax 1.11 cm ^2), mild LA dilatation  . Paroxysmal atrial fibrillation 2003  . Lumbosacral neuritis   . Unspecified hereditary and idiopathic peripheral neuropathy approx age 44    bilat LE's, ? left arm, too.  Feet became progressively numb +  left foot pain intermittently.  . Lumbosacral spondylosis   . CAD (coronary artery disease)     Nonobstructive by 12/2012 cath  . PUD (peptic ulcer disease)   . BPH (benign prostatic hypertrophy)   . HH (hiatus hernia)   . Glaucoma   . GERD (gastroesophageal reflux disease)   . Personal history of colonic adenoma 10/30/2012  . Shortness of breath     with exertion  . Peripheral neuropathy     [place]red[place]; with chronic neuropathic pain   Past Surgical History  Procedure Laterality Date  . Hemorrhoid surgery    . Vitrectomy    . Intraocular lens insertion Bilateral   . Leg surgery Bilateral   . Knee surgery Right   . Traeculectomy Left     eye  . Shoulder arthroscopy  08/30/2011    Procedure: ARTHROSCOPY SHOULDER;  Surgeon: [name], MD;  Location: [facility];  Service: Orthopedics;  Laterality: Right;  Right Shoulder Arthroscopy, Debridement, and Decompression  . Tonsillectomy    . Colonoscopy    . Cholecystectomy    . Toe amputation    . Colonoscopy [name] 10/30/2012    Procedure: COLONOSCOPY;  Surgeon: [name], MD;  Location: [facility];  Service: Endoscopy;  Laterality: [name];  . Cardiac catheterization  1997    Non-obstructive disease  . Cardiac catheterization  12/24/2012    mild < 20% LCx, prox 30% RCA; LVEF 55-65% , moderate pulmonary HTN, moderate AS  . Eye surgery Bilateral cataract  .  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 Hypertension Mother   . Coronary artery disease Mother   . Heart attack Mother   . Pulmonary fibrosis Father     asbestosis   History  Substance Use Topics  . Smoking status: Never Smoker   . Smokeless tobacco: Never Used  . Alcohol Use: No    Review of Systems  Respiratory: Negative for shortness of breath.   Cardiovascular:  Positive for leg swelling and near-syncope. Negative for chest pain and palpitations.  Gastrointestinal: Negative for nausea, vomiting, abdominal pain and blood in stool.  Neurological: Positive for dizziness and weakness. Negative for headaches.  All other systems reviewed and are negative.     Allergies  Brimonidine tartrate; Brinzolamide; Celecoxib; Codeine; Colchicine; Diltiazem; Latanoprost; Rofecoxib; Timolol maleate; and Vancomycin  Home Medications   Prior to Admission medications   Medication Sig Start Date End Date Taking? Authorizing Provider  allopurinol (ZYLOPRIM) 300 MG tablet take 1 tablet by mouth once daily 11/20/13  Yes Tammi Sou, MD  amitriptyline (ELAVIL) 100 MG tablet Take 100 mg by mouth at bedtime.   Yes Historical Provider, MD  apixaban (ELIQUIS) 5 MG TABS tablet Take 1 tablet (5 mg total) by mouth 2 (two) times daily. 01/06/14  Yes Tammi Sou, MD  clotrimazole-betamethasone (LOTRISONE) cream Apply 1 application topically 2 (two) times daily. 03/19/14  Yes Tammi Sou, MD  finasteride (PROSCAR) 5 MG tablet take 1 tablet by mouth once daily 02/11/14  Yes Tammi Sou, MD  Fluticasone-Salmeterol (ADVAIR) 250-50 MCG/DOSE AEPB Inhale 1 puff into the lungs daily as needed (for nasal congestion).    Yes Historical Provider, MD  gabapentin (NEURONTIN) 600 MG tablet Take 600 mg by mouth 4 (four) times daily.   Yes Historical Provider, MD  metoprolol succinate (TOPROL-XL) 50 MG 24 hr tablet Take 1 tablet (50 mg total) by mouth daily. Take with or immediately following a meal. 03/19/14  Yes Tammi Sou, MD  Multiple Vitamin (MULTIVITAMIN WITH MINERALS) TABS Take 1 tablet by mouth daily.   Yes Historical Provider, MD  niacin (NIASPAN) 1000 MG CR tablet Take 1 tablet (1,000 mg total) by mouth 2 (two) times daily. 08/15/13  Yes Lelon Perla, MD  omeprazole (PRILOSEC) 10 MG capsule take 1 capsule by mouth once daily 06/25/13  Yes Neena Rhymes, MD  OVER  THE COUNTER MEDICATION 1 capsule 3 (three) times daily. Stool [place]   Yes Historical Provider, MD  oxyCODONE-acetaminophen (PERCOCET) 10-325 MG per tablet Take 1 tablet by mouth 2 (two) times daily.   Yes Historical Provider, MD  potassium chloride SA (K-DUR,KLOR-CON) 20 MEQ tablet Take 10 mEq by mouth 2 (two) times daily.   Yes Historical Provider, MD  rosuvastatin (CRESTOR) 40 MG tablet Take 1 tablet (40 mg total) by mouth daily. 11/14/13  Yes Lelon Perla, MD  Thiamine HCl (VITAMIN B-1) 250 MG tablet Take 250 mg by mouth every evening.   Yes Historical Provider, MD  traMADol-acetaminophen (ULTRACET) 37.5-325 MG per tablet Take 1 tablet by mouth every 6 (six) hours as needed for moderate pain or severe pain (back & foot pain).   Yes Historical Provider, MD  vitamin C (ASCORBIC ACID) 250 MG tablet Take 250 mg by mouth every evening.  Yes Historical Provider, MD  ZETIA 10 MG tablet take 1 tablet by mouth once daily   Yes Lelon Perla, MD  albuterol (PROVENTIL HFA;VENTOLIN HFA) 108 (90 BASE) MCG/ACT inhaler Inhale 2 puffs into the lungs 4 (four) times daily as needed for wheezing or shortness of breath.    Historical Provider, MD  temazepam (RESTORIL) 15 MG capsule 1-2 tabs po qhs prn insomnia 03/19/14   Tammi Sou, MD   BP 140/59  Pulse 85  Temp(Src) 98 F (36.7 C) (Oral)  Resp 14  SpO2 100% Physical Exam  Nursing note and vitals reviewed. Constitutional: He is oriented to person, place, and time. He appears well-developed and well-nourished.  HENT:  Head: Normocephalic.  Eyes: Pupils are equal, round, and reactive to light.  Neck: Neck supple.  Cardiovascular: Normal rate.   Murmur heard. Pulmonary/Chest: Effort normal and breath sounds normal. No respiratory distress.  Abdominal: Soft. Bowel sounds are normal.  Musculoskeletal: He exhibits edema. He exhibits no tenderness.  Lymphadenopathy:    He has no cervical adenopathy.  Neurological: He is alert and oriented to  person, place, and time. No cranial nerve deficit.  Skin: Skin is warm and dry.  Psychiatric: He has a normal mood and affect.    ED Course  Procedures (including critical care time) Labs Review Labs Reviewed  CBC WITH DIFFERENTIAL  COMPREHENSIVE METABOLIC PANEL  URINALYSIS, ROUTINE W REFLEX MICROSCOPIC    Imaging Review No results found.   EKG Interpretation None       Lab results reviewed.  Stable anemia.  Patient discussed with and seen by Dr. Wilson Singer.   MDM   Final diagnoses:  None    Postural hypotension, likely due to anti-hypertensive medication.  Recent reduction in toprol dose.  Patient to follow-up with cardiologist and PCP to discuss further reduction in blood pressure medication.    Norman Herrlich, NP 03/28/14 [phone]

## 2014-03-27 NOTE — ED Notes (Signed)
Pt states that he has had troubles with dizziness over the past several months. Pt states doctors have lowered his BP medications because of hypotensive episodes. Pt states that doctors lowered metoprolol from 52m to 231mabout a week ago. After lowering the medication, pt states that the dizziness has worsened. Pt denies syncopal episodes, but states that over the last few days, "several minutes after he stands up he has felt like passing out." Pt VSS, NAD, on bedside cardiac monitor.

## 2014-03-27 NOTE — Patient Instructions (Addendum)
Amitriptyline discontinue

## 2014-03-27 NOTE — ED Notes (Signed)
Bed: BH05 Expected date:  Expected time:  Means of arrival:  Comments: EMS-hypotensive

## 2014-04-02 ENCOUNTER — Encounter: Payer: Self-pay | Admitting: Neurology

## 2014-04-02 ENCOUNTER — Ambulatory Visit (INDEPENDENT_AMBULATORY_CARE_PROVIDER_SITE_OTHER): Payer: Medicare Other | Admitting: Neurology

## 2014-04-02 VITALS — BP 124/70 | HR 102 | Ht 74.0 in | Wt 354.3 lb

## 2014-04-02 DIAGNOSIS — M4806 Spinal stenosis, lumbar region: Secondary | ICD-10-CM

## 2014-04-02 DIAGNOSIS — I951 Orthostatic hypotension: Secondary | ICD-10-CM

## 2014-04-02 DIAGNOSIS — M545 Low back pain, unspecified: Secondary | ICD-10-CM

## 2014-04-02 DIAGNOSIS — G8929 Other chronic pain: Secondary | ICD-10-CM

## 2014-04-02 DIAGNOSIS — M48061 Spinal stenosis, lumbar region without neurogenic claudication: Secondary | ICD-10-CM

## 2014-04-02 DIAGNOSIS — G609 Hereditary and idiopathic neuropathy, unspecified: Secondary | ICD-10-CM

## 2014-04-02 NOTE — Patient Instructions (Addendum)
1.  Start physical therapy for leg strengthing and balance therapy 2.  Check blood work today 3.  Referral to [facility] 4.  Follow-up with your primary care doctor or cardiology regarding further blood pressure medication adjustment for lightheadedness 5.  Return to clinic in 72-month  [facility]  Preventing Falls in the [place] common, often dreaded events in the lives of older people. Aside from the obvious injuries and even death that may result, falls can cause wide-ranging consequences including loss of independence, mental decline, decreased activity, and mobility. Younger people are also at risk of falling, especially those with chronic illnesses and fatigue.  Ways to reduce the risk for falling:  * Examine diet and medications. Warm foods and alcohol dilate blood vessels, which can lead to dizziness when standing. Sleep aids, antidepressants, and pain medications can also increase the likelihood of a fall.  * Get a vison exam. Poor vision, cataracts, and glaucoma increase the chances of falling.  * Check foot gear. Shoes should fit snugly and have a sturdy, nonskid sole and broad, low heel.  * Participate in a physician-approved exercise program to build and maintain muscle strength and improve balance and coordination.  * Increase vitamin D intake. Vitamin D improves muscle strength and increases the amount of calcium the body is able to absorb and deposit in bones.  How to prevent falls from common hazards:  * Floors - Remove all loose wires, cords, and throw rugs. Minimize clutter. Make sure rugs are anchored and smooth. Keep furniture in its usual place.  * Chairs - Use chairs with straight backs, armrests, and firm seats. Add firm cushions to existing pieces to add height.  * Bathroom - Install grab bars and non-skid tape in the tub or shower. Use a bathtub transfer bench or a shower chair with a back support. Use an elevated toilet seat and/or safety rails  to assist standing from a low surface. Do not use towel racks or bathroom tissue holders to help you stand.  * Lighting - Make sure halls, stairways, and entrances are well-lit. Install a night light in your bathroom or hallway. Make sure there is a light switch at the top and bottom of the staircase. Turn lights on if you get up in the middle of the night. Make sure lamps or light switches are within reach of the bed if you have to get up during the night.  * Kitchen - Install non-skid rubber mats near the sink and stove. Clean spills immediately. Store frequently used utensils, pots, and pans between waist and eye level. This helps prevent reaching and bending. Sit when getting things out of the lower cupboards.  * Living room / BVidorfurniture with wide spaces in between, giving enough room to move around. Establish a route through the living room that gives you something to hold onto as you walk.  * Stairs - Make sure treads, rails, and rugs are secure. Install a rail on both sides of the stairs. If stairs are a threat, it might be helpful to arrange most of your activities on the lower level to reduce the number of times you must climb the stairs.  * Entrances and doorways - Install metal handles on the walls adjacent to the doorknobs of all doors to make it more secure as you travel through the doorway.  Tips for maintaining balance:  * Keep at least one hand free at all times Try using a backpack  or fanny pack to hold things rather than carrying them in your hands. Never carry objects in both hands when walking as this interferes with keeping your balance.  * Attempt to swing both arms from front to back while walking. This might require a conscious effort if Parkinson's disease has diminished your movement. It will, however, help you to maintain balance and posture, and reduce fatigue.  * Consciously lift your feet off the ground when walking. Shuffling and dragging of the feet is a  common culprit in losing your balance.  * When trying to navigate turns, use a "U" technique of facing forward and making a wide turn, rather than pivoting sharply.  * Try to stand with your feet shoulder-length apart. When your feet are close together for any length of time, you increase your risk of losing your balance and falling.  * Do one thing at a time. Do not try to walk and accomplish another task, such as reading or looking around. The decrease in your automatic reflexes complicates motor function, so the less distraction, the better.  * Do not wear rubber or gripping soled shoes, they might "catch" on the floor and cause tripping.  * Move slowly when changing positions. Use deliberate, concentrated movements and, if needed, use a grab bar or walking aid. Count fifteen (15) seconds after standing to begin walking.  * If balance is a continuous problem, you might want to consider a walking aid such as a cane, walking stick, or walker. Once you have mastered walking with help, you may be ready to try it again on your own.  This information is provided by [facility] and is not intended to replace the medical advice of your physician or other health care providers. Please consult your physician or other health care providers for advice regarding your specific medical condition.

## 2014-04-02 NOTE — Progress Notes (Signed)
[facility] Clinic Note - Initial Visit   Date: 04/02/2014  Tim [name] MRN: [id number] DOB: 07-May-1941   Dear Dr. Anitra Lauth:  Thank you for your kind referral of Tim [name] for consultation of bilateral leg weakness. Although his history is well known to you, please allow [name] to reiterate it for the purpose of our medical record. The patient was accompanied to the clinic by self.   History of Present Illness: Tim [name] is a 73 y.o. right-handed Caucasian male with history of paroxsymal atrial fibrillation (anticoagulation stopped in  due to falls), moderate aortic stenosis, OSA on CPAP,  IBS, CAD, hyperlipidemia, hypertension, chronic low back pain and idiopathic peripheral neuropathy presenting for evaluation of proximal leg weakness and falls.    Starting around early 2014, he started having fall which initially only occurred about four times per year but started falling monthly over the past 12-month.  He noticed that he was unable to stand or walk as much as he was previously able to.  He is limited by doing additional walking or standing because of a combination of lightheadedness, shortness of breath, and proximal leg weakness.  Symptoms have slowly been worsening especially over the past 2 months.  He has 4 steps to get into his house and has difficulty even climbing this.    Also during the past 664-month he has been having problems with low blood pressure and his medications are being tapered.  He was transferred to the emergency department from his appointment with Dr. KiDianna Limboor chronic pain because his blood pressure was low.  His PCP and cardiologist has been reducing his blood pressure medications (reduced toprol and stopped cozaar and lasix).  Lightheadedness is worse when making positional changes and lasts 77m66m- 3m55mnd occurs every other day.   He has had peripheral neuropathy for the past 25 years, since early 1990.  He has  burning pain and numbness of the which started in the toes and gradually involved the level of the knees and over the past 5 years, it has also involved his hands.  He stopped playing the clarinet because of inability to feel it.  He has tried a number of medications and now is seeing Pain Management.  Currently, he takes neurontin 600mg577m, oxyocodone, and ultram.  Amitriptyline was recently stopped due to concern of contributing to his blood pressure.   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  Of not, he has a long history of chronic low back pain and has received epidural injections with no lasting benefit and has been evaluated by neurosurgery for lumbar spondylosis and foraminal stenosis in the past but was not a surgical candidate.       Past Medical History  Diagnosis Date  . HYPERCHOLESTEROLEMIA-PURE   . GOUT   . OBSTRUCTIVE SLEEP APNEA   . Essential hypertension, benign   . VENOUS INSUFFICIENCY   . ASTHMA   . COPD   . VENTRAL HERNIA   . OSTEOARTHRITIS   . CATARACT, HX OF   . PULMONARY HYPERTENSION, HX OF   . Hallux valgus (acquired)   . Obesity, unspecified   . Osteoarthrosis, unspecified whether generalized or localized, unspecified site   . Unspecified glaucoma   . Moderate aortic stenosis     Per 12/2012 echo- (VTI 1.08 cm^2, Vmax 1.11 cm ^2), mild LA dilatation  . Paroxysmal atrial fibrillation 2003  . Lumbosacral neuritis   .  Unspecified hereditary and idiopathic peripheral neuropathy approx age 89    bilat LE's, ? left arm, too.  Feet became progressively numb + left foot pain intermittently.  . Lumbosacral spondylosis   . CAD (coronary artery disease)     Nonobstructive by 12/2012 cath  . PUD (peptic ulcer disease)   . BPH (benign prostatic hypertrophy)   . HH (hiatus hernia)   . Glaucoma   . GERD (gastroesophageal reflux disease)   . Personal history of colonic adenoma 10/30/2012  . Shortness of breath       with exertion  . Peripheral neuropathy     Heredetary; with chronic neuropathic pain    Past Surgical History  Procedure Laterality Date  . Hemorrhoid surgery    . Vitrectomy    . Intraocular lens insertion Bilateral   . Leg surgery Bilateral   . Knee surgery Right   . Traeculectomy Left     eye  . Shoulder arthroscopy  08/30/2011    Procedure: ARTHROSCOPY SHOULDER;  Surgeon: [name], MD;  Location: [facility];  Service: Orthopedics;  Laterality: Right;  Right Shoulder Arthroscopy, Debridement, and Decompression  . Tonsillectomy    . Colonoscopy    . Cholecystectomy    . Toe amputation    . Colonoscopy [name] 10/30/2012    Procedure: COLONOSCOPY;  Surgeon: [name], MD;  Location: [facility];  Service: Endoscopy;  Laterality: [name];  . Cardiac catheterization  1997    Non-obstructive disease  . Cardiac catheterization  12/24/2012    mild < 20% LCx, prox 30% RCA; LVEF 55-65% , moderate pulmonary HTN, moderate AS  . Eye surgery Bilateral cataract  . Amputation Left 04/11/2013    Procedure: AMPUTATION DIGIT Left 3rd toe;  Surgeon: [name], MD;  Location: [facility];  Service: Orthopedics;  Laterality: Left;  Left 3rd toe amputation at [facility]     Medications:  Current Outpatient Prescriptions on File Prior to Visit  Medication Sig Dispense Refill  . albuterol (PROVENTIL HFA;VENTOLIN HFA) 108 (90 BASE) MCG/ACT inhaler Inhale 2 puffs into the lungs 4 (four) times daily as needed for wheezing or shortness of breath.      . clotrimazole-betamethasone (LOTRISONE) cream Apply 1 application topically 2 (two) times daily.  45 g  6  . finasteride (PROSCAR) 5 MG tablet take 1 tablet by mouth once daily  90 tablet  1  . Fluticasone-Salmeterol (ADVAIR) 250-50 MCG/DOSE AEPB Inhale 1 puff into the lungs daily as needed (for nasal congestion).       . gabapentin (NEURONTIN) 600 MG tablet Take 600 mg by mouth 4 (four) times daily.      . metoprolol succinate (TOPROL-XL) 50 MG 24 hr tablet Take  1 tablet (50 mg total) by mouth daily. Take with or immediately following a meal.  30 tablet  3   No current facility-administered medications on file prior to visit.    Allergies:  Allergies  Allergen Reactions  . Brimonidine Tartrate     ALPHAGAN-Shortness of breath  . Brinzolamide     AZOPT- Shortness of breath  . Celecoxib     CELLBREX-confusion  . Codeine     Childhood reaction  . Colchicine     diarrhea  . Diltiazem      leg swelling  . Latanoprost     XALATAN- Shortness of breath  . Rofecoxib      VIOXX- leg swelling  . Timolol Maleate     TIMOPTIC- Aggravated asthma  . Vancomycin  hives/blisters    Family History: Family History  Problem Relation Age of Onset  . Hypertension Mother   . Coronary artery disease Mother   . Heart attack Mother   . Pulmonary fibrosis Father     asbestosis  . Neuropathy Mother     Social History: History   Social History  . Marital Status: Married    Spouse Name: [name]    Number of Children: 0  . Years of Education: 20   Occupational History  . engineering     retired   Social History Main Topics  . Smoking status: Never Smoker   . Smokeless tobacco: Never Used  . Alcohol Use: No  . Drug Use: No  . Sexual Activity: Not Currently   Other Topics Concern  . Not on file   Social History Narrative   HSG, [name] - BS, [name] - MS-engineering, 2 years on PhD - [place]. Married - '65 - 54yr/divorced; '76- 3 yrs/divorced; '92 . No children. 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 ACP/Living Will - Yes CPR; long-term Mechanical ventilation as long as he was able to cognate; ok for long term artificial nutrition. Precondition being able to cognate and not to have too much pain.     Review of Systems:  CONSTITUTIONAL: No fevers, chills, night sweats, or weight loss.   EYES: No visual changes or eye pain ENT: No hearing changes.  No history of nose bleeds.   RESPIRATORY: No cough,  wheezing +shortness of breath.   CARDIOVASCULAR: Negative for chest pain, and palpitations.   GI: Negative for abdominal discomfort, blood in stools or black stools.  No recent change in bowel habits.   GU:  No history of incontinence.   MUSCLOSKELETAL: +history of joint pain or swelling.  No myalgias.   SKIN: +for lesions, rash, and itching.   HEMATOLOGY/ONCOLOGY: Negative for prolonged bleeding, bruising easily, and swollen nodes.  .   ENDOCRINE: Negative for cold or heat intolerance, polydipsia or goiter.   PSYCH:  No depression or anxiety symptoms.   NEURO: As Above.   Vital Signs:  BP 124/70  Pulse 102  Ht 6' 2"  (1.88 m)  Wt 354 lb 5 oz (160.715 kg)  BMI 45.47 kg/m2  SpO2 95%       BP  HR Supine     138/88 87 Sitting     134/84 88 Standing  124/70 102   General Medical Exam:   General:  Well appearing, comfortable.   Eyes/ENT: see cranial nerve examination.   Neck: No masses appreciated.  Full range of motion without tenderness.  No carotid bruits. Respiratory:  Clear to auscultation, good air entry bilaterally.   Cardiac:  Regular rate and rhythm, no murmur.    Extremities:  No deformities, edema, or skin discoloration. Good capillary refill.   Skin:  Venostasis skin changes of the legs, ecchymosis noted over the left knee   Neurological Exam: MENTAL STATUS including orientation to time, place, person, recent and remote memory, attention span and concentration, language, and fund of knowledge is normal.  Speech is not dysarthric.  CRANIAL NERVES: II:  No visual field defects.  Unremarkable fundi.   III-IV-VI: Pupils equal round and reactive to light.  Normal conjugate, extra-ocular eye movements in all directions of gaze.  No nystagmus.  Left ptosis without worsening with upgaze.   V:  Normal facial sensation.     VII:  Normal facial symmetry and movements.   VIII:  Normal  hearing and vestibular function.   IX-X:  Normal palatal movement.   XI:  Normal shoulder shrug  and head rotation.   XII:  Normal tongue strength and range of motion, no deviation or fasciculation.  MOTOR:  No atrophy, fasciculations or abnormal movements.  No pronator drift.  Tone is normal.   Neck flexion 5/5   Right Upper Extremity:    Left Upper Extremity:    Deltoid  5/5   Deltoid  5/5   Biceps  5/5   Biceps  5/5   Triceps  5/5   Triceps  5/5   Wrist extensors  5/5   Wrist extensors  5/5   Wrist flexors  5/5   Wrist flexors  5/5   Finger extensors  5/5   Finger extensors  5/5   Finger flexors  5/5   Finger flexors  5/5   Dorsal interossei  5/5   Dorsal interossei  5/5   Abductor pollicis  5/5   Abductor pollicis  5/5   Tone (Ashworth scale)  0  Tone (Ashworth scale)  0   Right Lower Extremity:    Left Lower Extremity:    Hip flexors  5-/5   Hip flexors  5-/5   Hip extensors  5/5   Hip extensors  5/5   Knee flexors  5/5   Knee flexors  5/5   Knee extensors  5/5   Knee extensors  5/5   Dorsiflexors  5/5   Dorsiflexors  5/5   Plantarflexors  5/5   Plantarflexors  5/5   Toe extensors  5/5   Toe extensors  5/5   Toe flexors  5/5   Toe flexors  5/5   Tone (Ashworth scale)  0  Tone (Ashworth scale)  0   MSRs:  Right                                                                 Left brachioradialis 2+  brachioradialis 2+  biceps 2+  biceps 2+  triceps 2+  triceps 2+  patellar 0  Patellar 1+  ankle jerk 0  ankle jerk 0  Hoffman no  Hoffman no  plantar response mute  plantar response mute   SENSORY: Sensation is absent to all modalities distal to mid-calf bilaterally and temperature is diminished at the finger tips. Romberg's sign present.   COORDINATION/GAIT: Normal finger-to- nose-finger. Intact rapid alternating movements bilaterally. Unable to rise from a chair without using arms.  Gait is slightly ataxic and wide-based, but appears stable. Unable to perform tandem and stressed gait.   Data: MRI lumbar spine 01/31/2014; 1. Mild left subarticular and foraminal  narrowing at L2-3 is similar to the prior study.  2. Progressive mild to moderate so reticular and foraminal stenosis bilaterally at L3-4 has progressed, left greater than right.  3. Progressive subarticular stenosis at L4-5 with similar appearance of foraminal narrowing bilaterally. Stenosis is worse on the right.  4. Minimal right subarticular and foraminal narrowing on the right at L5-S1.   Lab Results  Component Value Date   TSH 2.36 01/28/2014   Lab Results  Component Value Date   VITAMINB12 680 01/28/2014   Lab Results  Component Value Date   HGBA1C 5.6 12/23/2012   Lab Results  Component  Value Date   CKTOTAL 58 01/28/2014   TROPONINI <0.30 12/24/2012      IMPRESSION: 1.  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  Reviewed MRI lumbar spine with patient and explained that there is evidence of foraminal stenosis, he reports previously seeking neurosurgical opinion about 20 years ago, but was not deemed a surgical candidate.  He is interested in having a second opinion. For completeness and given constellation of dyspnea and mild right ptosis on exam, will evaluate for myasthenia although my suspicion for this is low. Normal CK makes myopathy unlikely and I do not feel that NCS/EMG will add additional information In the meantime, I recommend physical therapy for leg strengthening  2.  Idiopathic peripheral neuropathy, moderately severe conforming to length-dependent pattern  - Extensive discussion regarding the pathogenesis, etiology, management, and natural course of neuropathy.   - Neuropathy tends to be slowly progressive, especially if a treatable etiology is not identified.    - Labs including vitamin B12, HbA1c, and TSH is normal.  I will screen for other treatable causes for neuropathy  - He is established with pain management for neuropathic pain.  Currently taking neurontin 632m QID and oxyocodone  3.   Orthostatic hypotension  - Most likely due to over-treatment with blood pressure, however dysautonomia from neuropathy is also likely contributing  - Recommended compression stockings, however patient reports do to skin ulcerations in the past he cannot use it  - There has been no alteration of consciousness or syncope and he has remained awake through all of his falls  4.  History of atrial fibrillation, off anticoagulation due to falls  PLAN/RECOMMENDATIONS:  1.  Check MG panel, copper, ceruloplasmin, SPEP/UPEP with [facility] 2.  Start physical therapy for leg strengthing and balance therapy 3.  [facility] 4.  Follow-up with your primary care doctor or cardiology regarding further blood pressure medication adjustment for lightheadedness 5.  Return to clinic in 333-month The duration of this appointment visit was 60 minutes of face-to-face time with the patient.  Greater than 50% of this time was spent in counseling, explanation of diagnosis, planning of further management, and coordination of care.   Thank you for allowing me to participate in patient's care.  If I can answer any additional questions, I would be pleased to do so.    Sincerely,    Audrena Talaga K. PaPosey ProntoDO

## 2014-04-02 NOTE — Telephone Encounter (Signed)
Close  

## 2014-04-02 NOTE — ED Provider Notes (Signed)
Medical screening examination/treatment/procedure(s) were conducted as a shared visit with non-physician practitioner(s) and myself.  I personally evaluated the patient during the encounter.   EKG Interpretation   Date/Time:  Friday March 27 2014 17:33:32 EDT Ventricular Rate:  82 PR Interval:  162 QRS Duration: 170 QT Interval:  407 QTC Calculation: 475 R Axis:   58 Text Interpretation:  Sinus rhythm Right bundle branch block ED PHYSICIAN  INTERPRETATION AVAILABLE IN [facility] Confirmed by TEST, Record  ([id number]) on 03/29/2014 8:43:22 AM     Patient with persistent near syncopal episodes. Etiology is not completely clear to me. He may potentially have some type of autonomic dysfunction. Workup today has been fairly unremarkable. I agree that he needs neurological evaluation and reports an upcoming appointment. Not convinced that this is purely medication related. I feel he is stable for discharge at this time. Reiterated precautions in terms of driving, being at height, quick movements etc.  Virgel Manifold, MD 04/02/14 [phone]

## 2014-04-04 LAB — COPPER, SERUM: Copper: 81 ug/dL (ref 70–175)

## 2014-04-06 LAB — SPEP & IFE WITH QIG
ALBUMIN ELP: 56.8 % (ref 55.8–66.1)
ALPHA-2-GLOBULIN: 11.2 % (ref 7.1–11.8)
Alpha-1-Globulin: 6.3 % — ABNORMAL HIGH (ref 2.9–4.9)
BETA 2: 5.9 % (ref 3.2–6.5)
Beta Globulin: 7.5 % — ABNORMAL HIGH (ref 4.7–7.2)
Gamma Globulin: 12.3 % (ref 11.1–18.8)
IgA: 378 mg/dL (ref 68–379)
IgG (Immunoglobin G), Serum: 750 mg/dL (ref 650–1600)
IgM, Serum: 35 mg/dL — ABNORMAL LOW (ref 41–251)
M-Spike, %: 0.18 g/dL
Total Protein, Serum Electrophoresis: 6.6 g/dL (ref 6.0–8.3)

## 2014-04-06 LAB — UIFE/LIGHT CHAINS/TP QN, 24-HR UR
ALBUMIN, U: DETECTED
Total Protein, Urine: 15 mg/dL (ref 5–25)

## 2014-04-06 LAB — CERULOPLASMIN: CERULOPLASMIN: 21 mg/dL (ref 18–36)

## 2014-04-09 LAB — MYASTHENIA GRAVIS PANEL 2
ACETYLCHOLINE REC MOD AB: 10 %
Acetylcholine Rec Binding: <0.3 nmol/L
Aceytlcholine Rec Bloc Ab: <15 % of inhibition (ref <15)

## 2014-04-14 ENCOUNTER — Telehealth: Payer: Self-pay | Admitting: Neurology

## 2014-04-14 NOTE — Telephone Encounter (Signed)
I attempted to contact patient via phone today regarding the results of his labs, however there was no answer so a message was left for the patient to return my call.   Tim Wigen K. Posey Pronto, DO

## 2014-04-21 ENCOUNTER — Telehealth: Payer: Self-pay | Admitting: Neurology

## 2014-04-21 NOTE — Telephone Encounter (Signed)
Pt returning a call to [place]. Please call back at [phone] / Gayleen Orem.

## 2014-04-22 ENCOUNTER — Ambulatory Visit (INDEPENDENT_AMBULATORY_CARE_PROVIDER_SITE_OTHER): Payer: Medicare Other | Admitting: Cardiology

## 2014-04-22 ENCOUNTER — Other Ambulatory Visit: Payer: Self-pay | Admitting: *Deleted

## 2014-04-22 ENCOUNTER — Encounter: Payer: Self-pay | Admitting: Cardiology

## 2014-04-22 VITALS — BP 148/80 | HR 91 | Ht 74.0 in | Wt 343.0 lb

## 2014-04-22 DIAGNOSIS — E7800 Pure hypercholesterolemia, unspecified: Secondary | ICD-10-CM

## 2014-04-22 DIAGNOSIS — I2510 Atherosclerotic heart disease of native coronary artery without angina pectoris: Secondary | ICD-10-CM

## 2014-04-22 DIAGNOSIS — E669 Obesity, unspecified: Secondary | ICD-10-CM

## 2014-04-22 DIAGNOSIS — I10 Essential (primary) hypertension: Secondary | ICD-10-CM

## 2014-04-22 DIAGNOSIS — R29898 Other symptoms and signs involving the musculoskeletal system: Secondary | ICD-10-CM

## 2014-04-22 DIAGNOSIS — I350 Nonrheumatic aortic (valve) stenosis: Secondary | ICD-10-CM

## 2014-04-22 DIAGNOSIS — I480 Paroxysmal atrial fibrillation: Secondary | ICD-10-CM

## 2014-04-22 NOTE — Assessment & Plan Note (Signed)
Blood pressure controlled. Continue present medications. His orthostatic symptoms have resolved with reduction of blood pressure medications.

## 2014-04-22 NOTE — Assessment & Plan Note (Signed)
Patient remains in sinus rhythm. Continue beta blocker. He discontinued apixaban because of frequent falls. I will add aspirin. We will reconsider this at next office visit if he does not have further falls in the future. He has fallen 4 times in the past 6 months.

## 2014-04-22 NOTE — Assessment & Plan Note (Signed)
Symptoms have improved. Patient has multiple reasons to be short of breath. These include deconditioning, obesity hypoventilation syndrome, obstructive sleep apnea and diastolic congestive heart failure. He is not volume overloaded on examination. Continue off of Lasix given orthostatic symptoms. Continue CPAP. Note his aortic stenosis remains moderate on echocardiogram and unchanged compared to one year ago.

## 2014-04-22 NOTE — Assessment & Plan Note (Signed)
Moderate on most recent echo.

## 2014-04-22 NOTE — Assessment & Plan Note (Signed)
Continue statin. 

## 2014-04-22 NOTE — Telephone Encounter (Signed)
Called patient back and left message that I will mail lab orders to him and he can come by and have labs done in November.

## 2014-04-22 NOTE — Patient Instructions (Signed)
Your physician wants you to follow-up in: 6 MONTHS WITH DR CRENSHAW You will receive a reminder letter in the mail two months in advance. If you don't receive a letter, please call our office to schedule the follow-up appointment.  

## 2014-04-22 NOTE — Assessment & Plan Note (Signed)
Add aspirin. Continue statin.

## 2014-04-22 NOTE — Assessment & Plan Note (Signed)
Patient counseled on Weight loss.

## 2014-04-22 NOTE — Progress Notes (Signed)
HPI: FU paroxysmal atrial fibrillation, sleep apnea, and pedal edema. Admitted with CP June of 2014 and ruled out. Carotid Dopplers in June of 2014 showed no significant stenosis. Cardiac catheterization in June of 2014 showed a pulmonary artery pressure of 51/26 and pulmonary capillary wedge pressure of 20. Aortic valve area of 1.4 cm. There was a 20% circumflex, 30% right coronary artery. There was no disease in the LAD. Ejection fraction 55-65%. Medical therapy recommended. Echocardiogram performed in June 2014 showed moderate aortic stenosis with a mean gradient of 27 mm of mercury. Echocardiogram repeated in July 2015. Ejection fraction normal. Grade 1 diastolic dysfunction. Moderate aortic stenosis with a mean gradient of 28 mm of mercury. Trace aortic insufficiency. Mild biatrial enlargement and mild right ventricular enlargement. Chest CT July 2015 showed no acute disease. At previous office visit he was complaining of increased dyspnea. We resumed his Lasix. He initially was improving. However he discontinued his Lasix after 3 days because of incontinence. He apparently was seen at his primary care physician's office and was having low blood pressure with associated dizziness. His Toprol was decreased and his Cozaar was discontinued. He continued to complain of orthostatic symptoms and at last office visit we decreased his Toprol further. Since that time, His dizziness with standing has resolved. He denies dyspnea, chest pain or syncope. He did fall twice and has discontinued his apixaban. He feels much better than previous.  Current Outpatient Prescriptions  Medication Sig Dispense Refill  . albuterol (PROVENTIL HFA;VENTOLIN HFA) 108 (90 BASE) MCG/ACT inhaler Inhale 2 puffs into the lungs 4 (four) times daily as needed for wheezing or shortness of breath.      . allopurinol (ZYLOPRIM) 300 MG tablet take 1 tablet by mouth with meals      . clotrimazole-betamethasone (LOTRISONE) cream Apply  1 application topically 2 (two) times daily.  45 g  6  . finasteride (PROSCAR) 5 MG tablet take 1 tablet by mouth once daily  90 tablet  1  . Fluticasone-Salmeterol (ADVAIR) 250-50 MCG/DOSE AEPB Inhale 1 puff into the lungs daily as needed (for nasal congestion).       . gabapentin (NEURONTIN) 600 MG tablet Take 600 mg by mouth 4 (four) times daily.      . metoprolol succinate (TOPROL-XL) 50 MG 24 hr tablet Take 1 tablet (50 mg total) by mouth daily. Take with or immediately following a meal.  30 tablet  3  . Niacin CR 1000 MG TBCR Take 1,000 mg by mouth 2 (two) times daily.       . Probiotic Product (PROBIOTIC DAILY PO) Take by mouth.       No current facility-administered medications for this visit.     Past Medical History  Diagnosis Date  . HYPERCHOLESTEROLEMIA-PURE   . GOUT   . OBSTRUCTIVE SLEEP APNEA   . Essential hypertension, benign   . VENOUS INSUFFICIENCY   . ASTHMA   . COPD   . VENTRAL HERNIA   . OSTEOARTHRITIS   . CATARACT, HX OF   . PULMONARY HYPERTENSION, HX OF   . Hallux valgus (acquired)   . Obesity, unspecified   . Osteoarthrosis, unspecified whether generalized or localized, unspecified site   . Unspecified glaucoma   . Moderate aortic stenosis     Per 12/2012 echo- (VTI 1.08 cm^2, Vmax 1.11 cm ^2), mild LA dilatation  . Paroxysmal atrial fibrillation 2003  . Lumbosacral neuritis   . Unspecified hereditary and idiopathic peripheral neuropathy approx age 41  bilat LE's, ? left arm, too.  Feet became progressively numb + left foot pain intermittently.  . Lumbosacral spondylosis   . CAD (coronary artery disease)     Nonobstructive by 12/2012 cath  . PUD (peptic ulcer disease)   . BPH (benign prostatic hypertrophy)   . HH (hiatus hernia)   . Glaucoma   . GERD (gastroesophageal reflux disease)   . Personal history of colonic adenoma 10/30/2012  . Shortness of breath     with exertion  . Peripheral neuropathy     Heredetary; with chronic neuropathic pain     Past Surgical History  Procedure Laterality Date  . Hemorrhoid surgery    . Vitrectomy    . Intraocular lens insertion Bilateral   . Leg surgery Bilateral   . Knee surgery Right   . Traeculectomy Left     eye  . Shoulder arthroscopy  08/30/2011    Procedure: ARTHROSCOPY SHOULDER;  Surgeon: [name], MD;  Location: [place];  Service: Orthopedics;  Laterality: Right;  Right Shoulder Arthroscopy, Debridement, and Decompression  . Tonsillectomy    . Colonoscopy    . Cholecystectomy    . Toe amputation    . Colonoscopy [name] 10/30/2012    Procedure: COLONOSCOPY;  Surgeon: [name], MD;  Location: [facility];  Service: Endoscopy;  Laterality: [name];  . Cardiac catheterization  1997    Non-obstructive disease  . Cardiac catheterization  12/24/2012    mild < 20% LCx, prox 30% RCA; LVEF 55-65% , moderate pulmonary HTN, moderate AS  . Eye surgery Bilateral cataract  . Amputation Left 04/11/2013    Procedure: AMPUTATION DIGIT Left 3rd toe;  Surgeon: [name], MD;  Location: [place];  Service: Orthopedics;  Laterality: Left;  Left 3rd toe amputation at [facility]    History   Social History  . Marital Status: Married    Spouse Name: [name]    Number of Children: 0  . Years of Education: 20   Occupational History  . engineering     retired   Social History Main Topics  . Smoking status: Never Smoker   . Smokeless tobacco: Never Used  . Alcohol Use: No  . Drug Use: No  . Sexual Activity: Not Currently   Other Topics Concern  . Not on file   Social History Narrative   HSG, [name] - BS, [name] - MS-engineering, 2 years on PhD - [place]. Married - '65 - 50yr/divorced; '76- 3 yrs/divorced; '92 . No children. 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 ACP/Living Will - Yes CPR; long-term Mechanical ventilation as long as he was able to cognate; ok for long term artificial nutrition. Precondition being able to cognate and not to have too much pain.      ROS: no fevers or chills, productive cough, hemoptysis, dysphasia, odynophagia, melena, hematochezia, dysuria, hematuria, rash, seizure activity, orthopnea, PND, claudication. Remaining systems are negative.  Physical Exam: Well-developed morbidly obese in no acute distress.  Skin is warm and dry.  HEENT is normal.  Neck is supple.  Chest is clear to auscultation with normal expansion.  Cardiovascular exam is regular rate and rhythm. 2/6 systolic murmur Abdominal exam nontender or distended. No masses palpated. Extremities show trace to 1+ edema. neuro grossly intact  ECG 03/27/2014-sinus rhythm, right bundle branch block.

## 2014-04-29 ENCOUNTER — Other Ambulatory Visit: Payer: Self-pay | Admitting: Family Medicine

## 2014-04-29 MED ORDER — ALLOPURINOL 300 MG PO TABS: 300.0000 mg | ORAL_TABLET | Freq: Every day | ORAL | Status: DC

## 2014-05-05 ENCOUNTER — Other Ambulatory Visit: Payer: Self-pay | Admitting: *Deleted

## 2014-05-05 DIAGNOSIS — G609 Hereditary and idiopathic neuropathy, unspecified: Secondary | ICD-10-CM

## 2014-05-05 DIAGNOSIS — R29898 Other symptoms and signs involving the musculoskeletal system: Secondary | ICD-10-CM

## 2014-05-15 ENCOUNTER — Ambulatory Visit (HOSPITAL_BASED_OUTPATIENT_CLINIC_OR_DEPARTMENT_OTHER): Payer: Medicare Other | Admitting: Physical Medicine & Rehabilitation

## 2014-05-15 ENCOUNTER — Encounter: Payer: Medicare Other | Attending: Registered Nurse

## 2014-05-15 ENCOUNTER — Encounter: Payer: Self-pay | Admitting: Physical Medicine & Rehabilitation

## 2014-05-15 VITALS — BP 164/71 | HR 82 | Resp 16 | Wt 355.0 lb

## 2014-05-15 DIAGNOSIS — M545 Low back pain, unspecified: Secondary | ICD-10-CM

## 2014-05-15 DIAGNOSIS — M109 Gout, unspecified: Secondary | ICD-10-CM | POA: Diagnosis not present

## 2014-05-15 DIAGNOSIS — I2510 Atherosclerotic heart disease of native coronary artery without angina pectoris: Secondary | ICD-10-CM | POA: Diagnosis not present

## 2014-05-15 DIAGNOSIS — M1990 Unspecified osteoarthritis, unspecified site: Secondary | ICD-10-CM | POA: Insufficient documentation

## 2014-05-15 DIAGNOSIS — K219 Gastro-esophageal reflux disease without esophagitis: Secondary | ICD-10-CM | POA: Insufficient documentation

## 2014-05-15 DIAGNOSIS — G8929 Other chronic pain: Secondary | ICD-10-CM

## 2014-05-15 DIAGNOSIS — E780 Pure hypercholesterolemia: Secondary | ICD-10-CM | POA: Insufficient documentation

## 2014-05-15 DIAGNOSIS — G608 Other hereditary and idiopathic neuropathies: Secondary | ICD-10-CM

## 2014-05-15 DIAGNOSIS — G609 Hereditary and idiopathic neuropathy, unspecified: Secondary | ICD-10-CM | POA: Diagnosis not present

## 2014-05-15 DIAGNOSIS — J449 Chronic obstructive pulmonary disease, unspecified: Secondary | ICD-10-CM | POA: Insufficient documentation

## 2014-05-15 DIAGNOSIS — M79672 Pain in left foot: Secondary | ICD-10-CM | POA: Diagnosis present

## 2014-05-15 MED ORDER — OXYCODONE-ACETAMINOPHEN 10-325 MG PO TABS: 1.0000 | ORAL_TABLET | Freq: Three times a day (TID) | ORAL | Status: DC | PRN

## 2014-05-15 NOTE — Patient Instructions (Signed)
May occasionally take up to 3 tablets per day

## 2014-05-15 NOTE — Progress Notes (Signed)
Subjective:    Patient ID: Tim [name], male    DOB: Jan 03, 1941, 73 y.o.   MRN: [id number]  HPI Sees neurology- Dr. Posey Pronto  Will see neurosurgery for evaluation Left foot pain went away for about 3-1/2 weeks but then returned more severe than before Pain Inventory Average Pain 9 Pain Right Now 2 My pain is intermittent, sharp and dull  In the last 24 hours, has pain interfered with the following? General activity 8 Relation with others 8 Enjoyment of life 6 What TIME of day is your pain at its worst? night Sleep (in general) Fair  Pain is worse with: walking, bending, standing and some activites Pain improves with: rest, pacing activities, medication and injections Relief from Meds: 5  Mobility use a cane how many minutes can you walk? 3-5 ability to climb steps?  yes do you drive?  yes  Function retired I need assistance with the following:  meal prep, household duties and shopping  Neuro/Psych weakness tremor trouble walking dizziness  Prior Studies Any changes since last visit?  no  Physicians involved in your care Any changes since last visit?  no   Family History  Problem Relation Age of Onset  . Hypertension Mother   . Coronary artery disease Mother   . Heart attack Mother   . Pulmonary fibrosis Father     asbestosis  . Neuropathy Mother    History   Social History  . Marital Status: Married    Spouse Name: [name]    Number of Children: 0  . Years of Education: 20   Occupational History  . engineering     retired   Social History Main Topics  . Smoking status: Never Smoker   . Smokeless tobacco: Never Used  . Alcohol Use: No  . Drug Use: No  . Sexual Activity: Not Currently   Other Topics Concern  . None   Social History Narrative   HSG, [name] - BS, [name] - MS-engineering, 2 years on PhD - [name]. Married - '65 - 13yr/divorced; '76- 3 yrs/divorced; '92 . No children. 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 ACP/Living Will - Yes CPR; long-term Mechanical ventilation as long as he was able to cognate; ok for long term artificial nutrition. Precondition being able to cognate and not to have too much pain.    Past Surgical History  Procedure Laterality Date  . Hemorrhoid surgery    . Vitrectomy    . Intraocular lens insertion Bilateral   . Leg surgery Bilateral   . Knee surgery Right   . Traeculectomy Left     eye  . Shoulder arthroscopy  08/30/2011    Procedure: ARTHROSCOPY SHOULDER;  Surgeon: [name] MD;  Location[place] [facility]  Service: Orthopedics;  Laterality: Right;  Right Shoulder Arthroscopy, Debridement, and Decompression  . Tonsillectomy    . Colonoscopy    . Cholecystectomy    . Toe amputation    . Colonoscopy [name] 10/30/2012    Procedure: COLONOSCOPY;  Surgeon: CGatha Mayer MD;  [facility];  Service: Endoscopy;  Laterality: [name];  . Cardiac catheterization  1997    Non-obstructive disease  . Cardiac catheterization  12/24/2012    mild < 20% LCx, prox 30% RCA; LVEF 55-65% , moderate pulmonary HTN, moderate AS  . Eye surgery Bilateral cataract  . Amputation Left 04/11/2013    Procedure: AMPUTATION DIGIT Left 3rd toe;  Surgeon: [name] MD;  [facility];  Service: Orthopedics;  Laterality: Left;  Left 3rd toe amputation at [facility]   Past Medical History  Diagnosis Date  . HYPERCHOLESTEROLEMIA-PURE   . GOUT   . OBSTRUCTIVE SLEEP APNEA   . Essential hypertension, benign   . VENOUS INSUFFICIENCY   . ASTHMA   . COPD   . VENTRAL HERNIA   . OSTEOARTHRITIS   . CATARACT, HX OF   . PULMONARY HYPERTENSION, HX OF   . Hallux valgus (acquired)   . Obesity, unspecified   . Osteoarthrosis, unspecified whether generalized or localized, unspecified site   . Unspecified glaucoma   . Moderate aortic stenosis     Per 12/2012 echo- (VTI 1.08 cm^2, Vmax 1.11 cm ^2), mild LA dilatation  . Paroxysmal atrial fibrillation 2003  . Lumbosacral neuritis   .  Unspecified hereditary and idiopathic peripheral neuropathy approx age 47    bilat LE's, ? left arm, too.  Feet became progressively numb + left foot pain intermittently.  . Lumbosacral spondylosis   . CAD (coronary artery disease)     Nonobstructive by 12/2012 cath  . PUD (peptic ulcer disease)   . BPH (benign prostatic hypertrophy)   . HH (hiatus hernia)   . Glaucoma   . GERD (gastroesophageal reflux disease)   . Personal history of colonic adenoma 10/30/2012  . Shortness of breath     with exertion  . Peripheral neuropathy     Heredetary; with chronic neuropathic pain   BP 164/71 mmHg  Pulse 82  Resp 16  Wt 355 lb (161.027 kg)  SpO2 99%  Opioid Risk Score:   Fall Risk Score: Moderate Fall Risk (6-13 points) (previoulsy educated and declined handout) Review of Systems  Respiratory: Positive for apnea and shortness of breath.   Musculoskeletal: Positive for gait problem.  Skin: Positive for rash.  Neurological: Positive for dizziness, tremors and weakness.  All other systems reviewed and are negative.      Objective:   Physical Exam  Constitutional: He is oriented to person, place, and time.  Neurological: He is alert and oriented to person, place, and time.  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 We'll continue his current dosage of oxycodone 10 mg twice a day with occasional 3 times a day dosing on bad days. Continue gabapentin 600 mg 4 times a day prescribed by another physician  Patient will follow up with neurosurgery in terms of his back. He previously had mixed results with S1 epidural injections, No clear-cut radicular symptoms. Multiple medical comorbidities  Return to clinic one month for nurse practitioner follow-up

## 2014-05-20 ENCOUNTER — Ambulatory Visit (INDEPENDENT_AMBULATORY_CARE_PROVIDER_SITE_OTHER): Payer: Medicare Other | Admitting: Family Medicine

## 2014-05-20 VITALS — BP 139/83 | HR 83 | Temp 97.8°F | Resp 18 | Ht 73.0 in | Wt 356.0 lb

## 2014-05-20 DIAGNOSIS — R202 Paresthesia of skin: Secondary | ICD-10-CM

## 2014-05-20 DIAGNOSIS — R531 Weakness: Principal | ICD-10-CM

## 2014-05-20 DIAGNOSIS — R42 Dizziness and giddiness: Secondary | ICD-10-CM

## 2014-05-20 NOTE — Progress Notes (Signed)
Pre visit review using our clinic review tool, if applicable. No additional management support is needed unless otherwise documented below in the visit note. 

## 2014-05-20 NOTE — Progress Notes (Signed)
OFFICE VISIT  05/24/2014   CC:  Chief Complaint  Patient presents with  . Follow-up   HPI:    Patient is a 73 y.o. Caucasian male who presents for 2 mo f/u bilat LE generalized weakness and recurrent orthostatic dizziness.  Review of bp/hr taken with wrist cuff from 11/6 to 11/18 show highly variable numbers but none higher than 008 systolic or 84 diastolic.  HR 60-91.   Avg syst 132, avg diast 65, avg HR 71.  Lowest bp 104/46.  He has not been bothered nearly as much with low bp/orthostatic sx's since I last saw him.  He was seen by neurologist and the impression was that his LE weakness was mostly from lumbar foraminal stenosis with nerve impingement (L2-S1).  Neurosurgery referral was made, PT referral was made, some labs were done.  His SPEP was mildly abnl and there is plan to repeat this later this month. He has appt with Dr. Trenton Gammon 06/02/14. He has no new complaints or questions today.    ROS: no wheezing (albut use x 2 since I last saw him)   Past Medical History  Diagnosis Date  . HYPERCHOLESTEROLEMIA-PURE   . GOUT   . OBSTRUCTIVE SLEEP APNEA   . Essential hypertension, benign   . VENOUS INSUFFICIENCY   . ASTHMA   . COPD   . VENTRAL HERNIA   . OSTEOARTHRITIS   . CATARACT, HX OF   . PULMONARY HYPERTENSION, HX OF   . Hallux valgus (acquired)   . Obesity, unspecified   . Osteoarthrosis, unspecified whether generalized or localized, unspecified site   . Unspecified glaucoma   . Moderate aortic stenosis     Per 12/2012 echo- (VTI 1.08 cm^2, Vmax 1.11 cm ^2), mild LA dilatation  . Paroxysmal atrial fibrillation 2003  . Lumbosacral neuritis   . Unspecified hereditary and idiopathic peripheral neuropathy approx age 27    bilat LE's, ? left arm, too.  Feet became progressively numb + left foot pain intermittently.  . Lumbosacral spondylosis   . CAD (coronary artery disease)     Nonobstructive by 12/2012 cath  . PUD (peptic ulcer disease)   . BPH (benign prostatic  hypertrophy)   . HH (hiatus hernia)   . Glaucoma   . GERD (gastroesophageal reflux disease)   . Personal history of colonic adenoma 10/30/2012  . Shortness of breath     with exertion  . Peripheral neuropathy     Heredetary; with chronic neuropathic pain    Past Surgical History  Procedure Laterality Date  . Hemorrhoid surgery    . Vitrectomy    . Intraocular lens insertion Bilateral   . Leg surgery Bilateral   . Knee surgery Right   . Traeculectomy Left     eye  . Shoulder arthroscopy  08/30/2011    Procedure: ARTHROSCOPY SHOULDER;  Surgeon: [name], MD;  Location: [facility];  Service: Orthopedics;  Laterality: Right;  Right Shoulder Arthroscopy, Debridement, and Decompression  . Tonsillectomy    . Colonoscopy    . Cholecystectomy    . Toe amputation    . Colonoscopy [name] 10/30/2012    Procedure: COLONOSCOPY;  Surgeon: [name], MD;  Location: [facility];  Service: Endoscopy;  Laterality: [name];  . Cardiac catheterization  1997    Non-obstructive disease  . Cardiac catheterization  12/24/2012    mild < 20% LCx, prox 30% RCA; LVEF 55-65% , moderate pulmonary HTN, moderate AS  . Eye surgery Bilateral cataract  . Amputation Left  04/11/2013    Procedure: AMPUTATION DIGIT Left 3rd toe;  Surgeon: [name], MD;  Location: [facility];  Service: Orthopedics;  Laterality: Left;  Left 3rd toe amputation at [facility]    Outpatient Prescriptions Prior to Visit  Medication Sig Dispense Refill  . albuterol (PROVENTIL HFA;VENTOLIN HFA) 108 (90 BASE) MCG/ACT inhaler Inhale 2 puffs into the lungs 4 (four) times daily as needed for wheezing or shortness of breath.    . allopurinol (ZYLOPRIM) 300 MG tablet Take 1 tablet (300 mg total) by mouth daily. take 1 tablet by mouth with meals 30 tablet 3  . clotrimazole-betamethasone (LOTRISONE) cream Apply 1 application topically 2 (two) times daily. 45 g 6  . finasteride (PROSCAR) 5 MG tablet take 1 tablet by mouth once daily 90 tablet 1  .  Fluticasone-Salmeterol (ADVAIR) 250-50 MCG/DOSE AEPB Inhale 1 puff into the lungs daily as needed (for nasal congestion).     . gabapentin (NEURONTIN) 600 MG tablet Take 600 mg by mouth 4 (four) times daily.    . metoprolol succinate (TOPROL-XL) 50 MG 24 hr tablet Take 1 tablet (50 mg total) by mouth daily. Take with or immediately following a meal. 30 tablet 3  . Niacin CR 1000 MG TBCR Take 1,000 mg by mouth 2 (two) times daily.     [name] oxyCODONE-acetaminophen (PERCOCET) 10-325 MG per tablet Take 1 tablet by mouth every 8 (eight) hours as needed for pain. 1 month supply 70 tablet 0  . Probiotic Product (PROBIOTIC DAILY PO) Take by mouth.    . rosuvastatin (CRESTOR) 40 MG tablet Take 40 mg by mouth daily.     No facility-administered medications prior to visit.    Allergies  Allergen Reactions  . Brimonidine Tartrate     ALPHAGAN-Shortness of breath  . Brinzolamide     AZOPT- Shortness of breath  . Celecoxib     CELLBREX-confusion  . Codeine     Childhood reaction  . Colchicine     diarrhea  . Diltiazem      leg swelling  . Latanoprost     XALATAN- Shortness of breath  . Rofecoxib      VIOXX- leg swelling  . Timolol Maleate     TIMOPTIC- Aggravated asthma  . Vancomycin     hives/blisters    ROS As per HPI  PE: Blood pressure 139/83, pulse 83, temperature 97.8 F (36.6 C), temperature source Temporal, resp. rate 18, height 6' 1"  (1.854 m), weight 356 lb (161.481 kg), SpO2 100 %. Gen: Alert, well appearing, morbidly obese-appearing.  Patient is oriented to person, place, time, and situation. TMY:TRZN: no injection, icteris, swelling, or exudate.  EOMI, PERRLA. Mouth: lips without lesion/swelling.  Oral mucosa pink and moist. Oropharynx without erythema, exudate, or swelling.  CV: RRR, S1 and S2 distant, soft systolic flow murmur. Chest is clear, no wheezing or rales. Normal symmetric air entry throughout both lung fields. No chest wall deformities or tenderness.  LABS:   None today Recent:   Chemistry      Component Value Date/Time   NA 138 03/27/2014 1729   K 4.3 03/27/2014 1729   CL 99 03/27/2014 1729   CO2 26 03/27/2014 1729   BUN 18 03/27/2014 1729   CREATININE 0.81 03/27/2014 1729   CREATININE 0.96 01/14/2014 1656      Component Value Date/Time   CALCIUM 9.4 03/27/2014 1729   ALKPHOS 74 03/27/2014 1729   AST 31 03/27/2014 1729   ALT 36 03/27/2014 1729   BILITOT 0.5  03/27/2014 1729       IMPRESSION AND PLAN:  Paresthesias with subjective weakness He has a multitude of neurologic symptoms, some coming from a hereditary peripheral neuropathy, some coming from lumbar spondylosis with nerve impingement.  He has appropriate pain mgmt in place for his PN and has neurosurgeon appt set to get further opinion on his lumbar spine situation.   Postural dizziness I think this has proven to be mostly a orthostatic hypotension problem, as it has gradually gotten to the point of nearly 90 % resolution as we've down titrated his bp meds (esp beta blocker).  Possibly may have some autonomic dysfunction as part of his hereditary PN. Reassured pt, no med changes today.   Spent 30 min with pt today, with >50% of this time spent in counseling and care coordination regarding the above problems.  An After Visit Summary was printed and given to the patient.  FOLLOW UP: Return in about 4 months (around 09/18/2014) for 30 min f/u chronic med illness/labs--morning appt, fasting.

## 2014-05-24 ENCOUNTER — Encounter: Payer: Self-pay | Admitting: Family Medicine

## 2014-05-24 DIAGNOSIS — R42 Dizziness and giddiness: Secondary | ICD-10-CM | POA: Insufficient documentation

## 2014-05-24 NOTE — Assessment & Plan Note (Signed)
I think this has proven to be mostly a orthostatic hypotension problem, as it has gradually gotten to the point of nearly 90 % resolution as we've down titrated his bp meds (esp beta blocker).  Possibly may have some autonomic dysfunction as part of his hereditary PN. Reassured pt, no med changes today.

## 2014-05-24 NOTE — Assessment & Plan Note (Signed)
He has a multitude of neurologic symptoms, some coming from a hereditary peripheral neuropathy, some coming from lumbar spondylosis with nerve impingement.  He has appropriate pain mgmt in place for his PN and has neurosurgeon appt set to get further opinion on his lumbar spine situation.

## 2014-06-02 ENCOUNTER — Other Ambulatory Visit: Payer: Self-pay | Admitting: Neurology

## 2014-06-03 ENCOUNTER — Other Ambulatory Visit: Payer: Self-pay | Admitting: Registered Nurse

## 2014-06-05 LAB — UIFE/LIGHT CHAINS/TP QN, 24-HR UR
Albumin, U: DETECTED
Alpha 1, Urine: DETECTED — AB
Alpha 2, Urine: DETECTED — AB
Beta, Urine: DETECTED — AB
Gamma Globulin, Urine: DETECTED — AB
Total Protein, Urine: 31 mg/dL — ABNORMAL HIGH (ref 5–25)

## 2014-06-08 ENCOUNTER — Telehealth: Payer: Self-pay | Admitting: Neurology

## 2014-06-08 ENCOUNTER — Other Ambulatory Visit: Payer: Self-pay | Admitting: Family Medicine

## 2014-06-08 MED ORDER — POTASSIUM CHLORIDE CRYS ER 20 MEQ PO TBCR: 20.0000 meq | EXTENDED_RELEASE_TABLET | Freq: Every day | ORAL | Status: DC

## 2014-06-08 NOTE — Telephone Encounter (Signed)
Pt called wanting to speak to a nurse regarding a referral to physical therapy. Please call pt # [phone]

## 2014-06-08 NOTE — Telephone Encounter (Signed)
Patient called checking on the status of his PT.  I called over to PT and they do have his referral but are behind.  I informed patient that I would have them call him soon.

## 2014-06-09 ENCOUNTER — Telehealth: Payer: Self-pay | Admitting: Neurology

## 2014-06-09 LAB — SPEP & IFE WITH QIG
ALBUMIN ELP: 57.3 % (ref 55.8–66.1)
ALPHA-1-GLOBULIN: 4.7 % (ref 2.9–4.9)
ALPHA-2-GLOBULIN: 11 % (ref 7.1–11.8)
BETA GLOBULIN: 6.7 % (ref 4.7–7.2)
Beta 2: 7.7 % — ABNORMAL HIGH (ref 3.2–6.5)
Gamma Globulin: 12.6 % (ref 11.1–18.8)
IGA: 402 mg/dL — AB (ref 68–379)
IgG (Immunoglobin G), Serum: 708 mg/dL (ref 650–1600)
IgM, Serum: 35 mg/dL — ABNORMAL LOW (ref 41–251)
M-Spike, %: 0.16 g/dL
TOTAL PROTEIN, SERUM ELECTROPHOR: 6.8 g/dL (ref 6.0–8.3)

## 2014-06-09 NOTE — Telephone Encounter (Signed)
Called to discuss SPEP/UPEP with IFE results, but there was no answer.  Message left to return my call.  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 There has been no interval change in his labs.  Low likelihood that this is causing neuropathy, but can recommend hematology evaluation to be sure a treatable neuropathy is not being missed.  Donika K. Posey Pronto, DO

## 2014-06-11 ENCOUNTER — Encounter (HOSPITAL_COMMUNITY): Payer: Self-pay | Admitting: Cardiology

## 2014-06-16 ENCOUNTER — Encounter: Payer: Medicare Other | Attending: Registered Nurse | Admitting: Registered Nurse

## 2014-06-16 DIAGNOSIS — M109 Gout, unspecified: Secondary | ICD-10-CM | POA: Insufficient documentation

## 2014-06-16 DIAGNOSIS — K219 Gastro-esophageal reflux disease without esophagitis: Secondary | ICD-10-CM | POA: Insufficient documentation

## 2014-06-16 DIAGNOSIS — E780 Pure hypercholesterolemia: Secondary | ICD-10-CM | POA: Insufficient documentation

## 2014-06-16 DIAGNOSIS — M1990 Unspecified osteoarthritis, unspecified site: Secondary | ICD-10-CM | POA: Insufficient documentation

## 2014-06-16 DIAGNOSIS — G609 Hereditary and idiopathic neuropathy, unspecified: Secondary | ICD-10-CM | POA: Insufficient documentation

## 2014-06-16 DIAGNOSIS — I2510 Atherosclerotic heart disease of native coronary artery without angina pectoris: Secondary | ICD-10-CM | POA: Insufficient documentation

## 2014-06-16 DIAGNOSIS — J449 Chronic obstructive pulmonary disease, unspecified: Secondary | ICD-10-CM | POA: Insufficient documentation

## 2014-07-08 ENCOUNTER — Encounter: Payer: Self-pay | Admitting: Family Medicine

## 2014-07-10 ENCOUNTER — Other Ambulatory Visit: Payer: Self-pay | Admitting: Registered Nurse

## 2014-07-10 ENCOUNTER — Other Ambulatory Visit: Payer: Self-pay | Admitting: Family Medicine

## 2014-07-10 MED ORDER — OMEPRAZOLE 10 MG PO CPDR: 10.0000 mg | DELAYED_RELEASE_CAPSULE | Freq: Every day | ORAL | Status: DC

## 2014-07-14 ENCOUNTER — Encounter: Payer: Self-pay | Admitting: Neurology

## 2014-07-14 ENCOUNTER — Ambulatory Visit (INDEPENDENT_AMBULATORY_CARE_PROVIDER_SITE_OTHER): Payer: Medicare Other | Admitting: Neurology

## 2014-07-14 VITALS — BP 148/80 | HR 82 | Ht 72.0 in | Wt 353.4 lb

## 2014-07-14 DIAGNOSIS — M792 Neuralgia and neuritis, unspecified: Secondary | ICD-10-CM

## 2014-07-14 DIAGNOSIS — M48061 Spinal stenosis, lumbar region without neurogenic claudication: Secondary | ICD-10-CM

## 2014-07-14 DIAGNOSIS — M4806 Spinal stenosis, lumbar region: Secondary | ICD-10-CM

## 2014-07-14 DIAGNOSIS — G609 Hereditary and idiopathic neuropathy, unspecified: Secondary | ICD-10-CM

## 2014-07-14 DIAGNOSIS — G3184 Mild cognitive impairment, so stated: Secondary | ICD-10-CM

## 2014-07-14 DIAGNOSIS — R29898 Other symptoms and signs involving the musculoskeletal system: Secondary | ICD-10-CM

## 2014-07-14 NOTE — Patient Instructions (Addendum)
1.  Referral to podiatry  2.  Consider adding cymbalta or venlafaxine 3.  Start physical therapy for leg strengthing and balance therapy 4. Neuropsychiatric testing 5.  Return to clinic in 62-month

## 2014-07-14 NOTE — Progress Notes (Signed)
Follow-up Visit   Date: 07/14/2014    Tim [name] MRN: [id number] DOB: [birth year]   Interim History: Tim [name] is a 74 y.o. right-handed Caucasian male with history of paroxsymal atrial fibrillation (anticoagulation stopped in due to falls), moderate aortic stenosis, OSA on CPAP, IBS, CAD, hyperlipidemia, hypertension, chronic low back pain and idiopathic peripheral neuropathy returning for evaluation of proximal leg weakness and neuropathy and new complaints of memory problems.   History of present illness: Starting around early 2014, he started having fall which initially only occurred about four times per year but started falling monthly over the past 76-month. He noticed that he was unable to stand or walk as much as he was previously able to. He is limited by doing additional walking or standing because of a combination of lightheadedness, shortness of breath, and proximal leg weakness. Symptoms have slowly been worsening especially over the past 2 months. He has 4 steps to get into his house and has difficulty even climbing this.   Also during the past 672-month he has been having problems with low blood pressure and his medications are being tapered. He was transferred to the emergency department from his appointment with Dr. KiDianna Limboor chronic pain because his blood pressure was low. His PCP and cardiologist has been reducing his blood pressure medications (reduced toprol and stopped cozaar and lasix). Lightheadedness is worse when making positional changes and lasts 24m17m- 12m84mnd occurs every other day.   He has had peripheral neuropathy for the past 25 years, since early 1990. He has burning pain and numbness of the which started in the toes and gradually involved the level of the knees and over the past 5 years, it has also involved his hands. He stopped playing the clarinet because of inability to feel it. He has tried a number of medications and now  is seeing Pain Management. Currently, he takes neurontin 600mg20m, oxyocodone, and ultram. Amitriptyline was recently stopped due to concern of contributing to his blood pressure. 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 Of not, he has a long history of chronic low back pain and has received epidural injections with no lasting benefit and has been evaluated by neurosurgery for lumbar spondylosis and foraminal stenosis in the past but was not a surgical candidate.   UPDATE 07/14/2014:  He was seen by Dr. PooleTrenton Gammonhe did not feel that he surgery was indicated.  His primary issue is his left foot pain is getting worse.  About 2-weeks ago, he had a spell of no pain at all.  Since then, he is taking more oxyocodone and unable to sleep at night.  His pain is sudden, sharp, and lancinating.  It is very brief lasting one a few seconds, but will reoccur.  Standing and sitting upright helps. No interval falls. Unfortunately, he did not hear back from PT, so has not had any gait training or leg strengthening.  He has noticed that he is able to walk farther without getting quadriceps soreness.    He is also complaining of short-term memory problems, word-finding problems, and processing has been more difficulty.  He not having difficulty with driving, no MVA, and no financial problems.   Medications:  Current Outpatient Prescriptions on File Prior to Visit  Medication Sig Dispense Refill  . albuterol (PROVENTIL HFA;VENTOLIN HFA) 108 (90 BASE) MCG/ACT inhaler Inhale 2 puffs into the lungs 4 (four) times daily  as needed for wheezing or shortness of breath.    . allopurinol (ZYLOPRIM) 300 MG tablet Take 1 tablet (300 mg total) by mouth daily. take 1 tablet by mouth with meals 30 tablet 3  . B Complex-C (B-COMPLEX WITH VITAMIN C) tablet Take 1 tablet by mouth daily.    . clotrimazole-betamethasone (LOTRISONE) cream Apply 1 application topically 2 (two)  times daily. 45 g 6  . finasteride (PROSCAR) 5 MG tablet take 1 tablet by mouth once daily 90 tablet 1  . Fluticasone-Salmeterol (ADVAIR) 250-50 MCG/DOSE AEPB Inhale 1 puff into the lungs daily as needed (for nasal congestion).     . gabapentin (NEURONTIN) 600 MG tablet Take 600 mg by mouth 4 (four) times daily.    [name] gabapentin (NEURONTIN) 600 MG tablet take 1 tablet by mouth four times a day after meals and at bedtime 120 tablet 0  . metoprolol succinate (TOPROL-XL) 50 MG 24 hr tablet Take 1 tablet (50 mg total) by mouth daily. Take with or immediately following a meal. 30 tablet 3  . Multiple Vitamin (MULTIVITAMIN) capsule Take 1 capsule by mouth daily.    . Niacin CR 1000 MG TBCR Take 1,000 mg by mouth 2 (two) times daily.     [name] omeprazole (PRILOSEC) 10 MG capsule Take 1 capsule (10 mg total) by mouth daily. 30 capsule 3  . oxyCODONE-acetaminophen (PERCOCET) 10-325 MG per tablet Take 1 tablet by mouth every 8 (eight) hours as needed for pain. 1 month supply 70 tablet 0  . potassium chloride SA (K-DUR,KLOR-CON) 20 MEQ tablet Take 1 tablet (20 mEq total) by mouth daily. 90 tablet 1  . Probiotic Product (PROBIOTIC DAILY PO) Take by mouth.    . rosuvastatin (CRESTOR) 40 MG tablet Take 40 mg by mouth daily.    . traMADol-acetaminophen (ULTRACET) 37.5-325 MG per tablet Take by mouth 4 (four) times daily.  0  . VITAMIN D, CHOLECALCIFEROL, PO Take by mouth.    [name] ZETIA 10 MG tablet Take 10 mg by mouth daily.  0   No current facility-administered medications on file prior to visit.    Allergies:  Allergies  Allergen Reactions  . Brimonidine Tartrate     ALPHAGAN-Shortness of breath  . Brinzolamide     AZOPT- Shortness of breath  . Celecoxib     CELLBREX-confusion  . Codeine     Childhood reaction  . Colchicine     diarrhea  . Diltiazem      leg swelling  . Latanoprost     XALATAN- Shortness of breath  . Rofecoxib      VIOXX- leg swelling  . Timolol Maleate     TIMOPTIC- Aggravated  asthma  . Vancomycin     hives/blisters    Review of Systems:  CONSTITUTIONAL: No fevers, chills, night sweats, or weight loss.  EYES: No visual changes or eye pain ENT: No hearing changes.  No history of nose bleeds.   RESPIRATORY: No cough, wheezing and shortness of breath.   CARDIOVASCULAR: Negative for chest pain, and palpitations.   GI: Negative for abdominal discomfort, blood in stools or black stools.  No recent change in bowel habits.   GU:  No history of incontinence.   MUSCLOSKELETAL: +history of joint pain or swelling.  No myalgias.   SKIN: Negative for lesions, rash, and itching.   ENDOCRINE: Negative for cold or heat intolerance, polydipsia or goiter.   PSYCH:  No depression or anxiety symptoms.   NEURO: As Above.   Vital Signs:  BP 148/80 mmHg  Pulse 82  Ht 6' (1.829 m)  Wt 353 lb 6 oz (160.29 kg)  BMI 47.92 kg/m2  SpO2 98%  Neurological Exam: Montreal Cognitive Assessment  07/14/2014  Visuospatial/ Executive (0/5) 4  Naming (0/3) 3  Attention: Read list of digits (0/2) 2  Attention: Read list of letters (0/1) 1  Attention: Serial 7 subtraction starting at 100 (0/3) 1  Language: Repeat phrase (0/2) 2  Language : Fluency (0/1) 1  Abstraction (0/2) 2  Delayed Recall (0/5) 1  Orientation (0/6) 5  Total 22  Adjusted Score (based on education) 22    CRANIAL NERVES:   Pupils equal round and reactive to light.  Normal conjugate, extra-ocular eye movements in all directions of gaze.  Left ptosis (old).  Face is symmetric. Palate elevates symmetrically.  Tongue is midline.  MOTOR:  Motor strength is 5/5 in all extremities, except 5-/5 RLE.   Tone is normal.    MSRs:  Reflexes are 2+/4 in the upper extremities, absent reflexes in the legs.   SENSORY:  Sensation is absent to all modalities distal to mid-calf bilaterally and temperature is diminished at the finger tips. Romberg's sign present.   COORDINATION/GAIT:  Gait is unsteady with scissoring  quality.   Data: MRI lumbar spine 01/31/2014; 1. Mild left subarticular and foraminal narrowing at L2-3 is similar to the prior study.  2. Progressive mild to moderate so reticular and foraminal stenosis bilaterally at L3-4 has progressed, left greater than right.  3. Progressive subarticular stenosis at L4-5 with similar appearance of foraminal narrowing bilaterally. Stenosis is worse on the right.  4. Minimal right subarticular and foraminal narrowing on the right at L5-S1.   Lab Results  Component Value Date   TSH 2.36 01/28/2014   Lab Results  Component Value Date   VITAMINB12 680 01/28/2014   Lab Results  Component Value Date   HGBA1C 5.6 12/23/2012   Lab Results  Component Value Date   CKTOTAL 58 01/28/2014   TROPONINI <0.30 12/24/2012   SPEP/UPEP with IFE 06/02/2014:  +monoclonal protein, IgA lambda Las 04/02/2014:  MG panel - negative  IMPRESSION: 1. 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   - Slightly improved  - Not a surgical candidate  - Conservative therapies with PT, pain management  2. Idiopathic peripheral neuropathy, moderately severe conforming to length-dependent pattern  - Follow-up with pain management    - Currently taking neurontin 64m QID and oxyocodone   - Options include adding Cymbalta or venlafaxine   - He has previously tried:  TCAs, topical ointments (lidocaine, capsaicin)  - Complains of left localized foot pain with was pain-free for 2 weeks and requesting further w/u since this is atypical for neuropathy, so will send referral to podiatry for structural evaluation of the foot  3. Mild cognitive impairment - new problem  - Possibly medication effect since he is on a number of medications with cognitive side effects vs early dementia  - MOCA 22/30 today  - Neuropsychiatry testing  4.  Orthostatic hypotension (medication induced) - resolved  5. History  of atrial fibrillation, off anticoagulation due to falls   PLAN/RECOMMENDATIONS:  1.  Referral to podiatry  2.  Consider adding cymbalta or venlafaxine - he would like to discuss with Dr. KLetta Pate3.  Start physical therapy for balance therapy 4. Neuropsychiatric testing 5.  Return to clinic in 311-month  The duration of this appointment visit was 40 minutes  of face-to-face time with the patient.  Greater than 50% of this time was spent in counseling, explanation of diagnosis, planning of further management, and coordination of care.   Thank you for allowing me to participate in patient's care.  If I can answer any additional questions, I would be pleased to do so.    Sincerely,    Donika K. Posey Pronto, DO

## 2014-07-16 ENCOUNTER — Ambulatory Visit (HOSPITAL_BASED_OUTPATIENT_CLINIC_OR_DEPARTMENT_OTHER): Payer: Medicare Other | Admitting: Physical Medicine & Rehabilitation

## 2014-07-16 ENCOUNTER — Encounter: Payer: Self-pay | Admitting: Physical Medicine & Rehabilitation

## 2014-07-16 ENCOUNTER — Other Ambulatory Visit: Payer: Self-pay | Admitting: Physical Medicine & Rehabilitation

## 2014-07-16 ENCOUNTER — Encounter: Payer: Medicare Other | Attending: Registered Nurse

## 2014-07-16 VITALS — BP 152/58 | HR 88 | Resp 14

## 2014-07-16 DIAGNOSIS — I2510 Atherosclerotic heart disease of native coronary artery without angina pectoris: Secondary | ICD-10-CM | POA: Insufficient documentation

## 2014-07-16 DIAGNOSIS — Z79899 Other long term (current) drug therapy: Secondary | ICD-10-CM

## 2014-07-16 DIAGNOSIS — K219 Gastro-esophageal reflux disease without esophagitis: Secondary | ICD-10-CM | POA: Insufficient documentation

## 2014-07-16 DIAGNOSIS — J449 Chronic obstructive pulmonary disease, unspecified: Secondary | ICD-10-CM | POA: Insufficient documentation

## 2014-07-16 DIAGNOSIS — E780 Pure hypercholesterolemia: Secondary | ICD-10-CM | POA: Diagnosis not present

## 2014-07-16 DIAGNOSIS — M545 Low back pain, unspecified: Secondary | ICD-10-CM

## 2014-07-16 DIAGNOSIS — G8929 Other chronic pain: Secondary | ICD-10-CM

## 2014-07-16 DIAGNOSIS — M48062 Spinal stenosis, lumbar region with neurogenic claudication: Secondary | ICD-10-CM

## 2014-07-16 DIAGNOSIS — G609 Hereditary and idiopathic neuropathy, unspecified: Secondary | ICD-10-CM | POA: Insufficient documentation

## 2014-07-16 DIAGNOSIS — M109 Gout, unspecified: Secondary | ICD-10-CM | POA: Diagnosis not present

## 2014-07-16 DIAGNOSIS — M1990 Unspecified osteoarthritis, unspecified site: Secondary | ICD-10-CM | POA: Diagnosis not present

## 2014-07-16 DIAGNOSIS — M79672 Pain in left foot: Secondary | ICD-10-CM | POA: Diagnosis present

## 2014-07-16 DIAGNOSIS — Z5181 Encounter for therapeutic drug level monitoring: Secondary | ICD-10-CM

## 2014-07-16 DIAGNOSIS — M4806 Spinal stenosis, lumbar region: Secondary | ICD-10-CM

## 2014-07-16 DIAGNOSIS — G608 Other hereditary and idiopathic neuropathies: Secondary | ICD-10-CM

## 2014-07-16 MED ORDER — OXYCODONE-ACETAMINOPHEN 10-325 MG PO TABS: 1.0000 | ORAL_TABLET | Freq: Three times a day (TID) | ORAL | Status: DC | PRN

## 2014-07-16 NOTE — Progress Notes (Signed)
Subjective:    Patient ID: Tim [name], male    DOB: 1941-04-30, 74 y.o.   MRN: [id number]  HPI The patient returns todayAfter last visit in November 2015. He is seeing a neurologist for cognitive impairment. Neurology felt that pain was likely lumbar stenosis related although also placed peripheral neuropathy in the problem list. This was because the patient has some waxing and waning of symptoms.Also the patient is undergoing neuropsychiatry evaluation. In addition neurology suggested a trial of Cymbalta or venlafaxine for the neuropathy related pain. We discussed with the patient that this would be in order to reduce either the  Gabapentin or oxycodone.  Patient had recent MRI lumbar spine showed mild to moderate multilevel stenosis. Symptoms are primarily left lower extremity in terms of pain to the little toe.  He previously had several epidurals left S1. The first one was helpful the other ones were not.  Pain Inventory Average Pain 9 Pain Right Now 1 My pain is intermittent, sharp and stabbing  In the last 24 hours, has pain interfered with the following? General activity 7 Relation with others 7 Enjoyment of life 5 What TIME of day is your pain at its worst? evening  And night Sleep (in general) Poor  Pain is worse with: walking, bending, standing and some activites Pain improves with: rest, pacing activities and medication Relief from Meds: 8  Mobility walk without assistance use a cane how many minutes can you walk? 7 ability to climb steps?  yes do you drive?  yes  Function disabled: date disabled .  Neuro/Psych weakness numbness tremor trouble walking dizziness  Prior Studies Any changes since last visit?  no  Physicians involved in your care Any changes since last visit?  no   Family History  Problem Relation Age of Onset  . Hypertension Mother   . Coronary artery disease Mother   . Heart attack Mother   . Pulmonary fibrosis Father    asbestosis  . Neuropathy Mother    History   Social History  . Marital Status: Married    Spouse Name: [name]    Number of Children: 0  . Years of Education: 20   Occupational History  . engineering     retired   Social History Main Topics  . Smoking status: Never Smoker   . Smokeless tobacco: Never Used  . Alcohol Use: No  . Drug Use: No  . Sexual Activity: Not Currently   Other Topics Concern  . None   Social History Narrative   HSG, [name] - BS, [name] - MS-engineering, 2 years on PhD - [name]. Married - '65 - 46yr/divorced; '76- 3 yrs/divorced; '92 . No children. 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 ACP/Living Will - Yes CPR; long-term Mechanical ventilation as long as he was able to cognate; ok for long term artificial nutrition. Precondition being able to cognate and not to have too much pain.    Past Surgical History  Procedure Laterality Date  . Hemorrhoid surgery    . Vitrectomy    . Intraocular lens insertion Bilateral   . Leg surgery Bilateral   . Knee surgery Right   . Traeculectomy Left     eye  . Shoulder arthroscopy  08/30/2011    Procedure: ARTHROSCOPY SHOULDER;  Surgeon: [name] MD;  [place] [facility]  Service: Orthopedics;  Laterality: Right;  Right Shoulder Arthroscopy, Debridement, and Decompression  . Tonsillectomy    . Colonoscopy    .  Cholecystectomy    . 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  R big toe surg due to osteoarth  . Colonoscopy [name] 10/30/2012    Procedure: COLONOSCOPY;  Surgeon: [name], MD;  Location: [facility];  Service: Endoscopy;  Laterality: [name];  . Cardiac catheterization  1997    Non-obstructive disease  . Cardiac catheterization  12/24/2012    mild < 20% LCx, prox 30% RCA; LVEF 55-65% , moderate pulmonary HTN, moderate AS  . Eye surgery Bilateral cataract  . Amputation Left 04/11/2013    Procedure: AMPUTATION DIGIT Left 3rd toe;  Surgeon: [name], MD;   Location: [facility];  Service: Orthopedics;  Laterality: Left;  Left 3rd toe amputation at [facility]  . Left and right heart catheterization with coronary angiogram [name] 12/24/2012    Procedure: LEFT AND RIGHT HEART CATHETERIZATION WITH CORONARY ANGIOGRAM;  Surgeon: [name], MD;  Location: [facility] C[facility];  Service: Cardiovascular;  Laterality: [name];   Past Medical History  Diagnosis Date  . HYPERCHOLESTEROLEMIA-PURE   . GOUT   . OSA on CPAP     8 cm H2O  . Essential hypertension, benign   . VENOUS INSUFFICIENCY   . ASTHMA   . COPD   . VENTRAL HERNIA   . OSTEOARTHRITIS   . CATARACT, HX OF   . PULMONARY HYPERTENSION, HX OF   . Hallux valgus (acquired)   . Obesity, unspecified   . Osteoarthrosis, unspecified whether generalized or localized, unspecified site   . Unspecified glaucoma   . Moderate aortic stenosis     Per 12/2012 echo- (VTI 1.08 cm^2, Vmax 1.11 cm ^2), mild LA dilatation  . Paroxysmal atrial fibrillation 2003  . Lumbosacral neuritis   . Unspecified hereditary and idiopathic peripheral neuropathy approx age 73    bilat LE's, ? left arm, too.  Feet became progressively numb + left foot pain intermittently.  . Lumbosacral spondylosis   . CAD (coronary artery disease)     Nonobstructive by 12/2012 cath  . PUD (peptic ulcer disease)   . BPH (benign prostatic hypertrophy)   . HH (hiatus hernia)   . Glaucoma   . GERD (gastroesophageal reflux disease)   . Personal history of colonic adenoma 10/30/2012  . Shortness of breath     with exertion  . Peripheral neuropathy     Heredetary; with chronic neuropathic pain   BP 152/58 mmHg  Pulse 88  Resp 14  SpO2 100%  Opioid Risk Score:   Fall Risk Score: Moderate Fall Risk (6-13 points)  Review of Systems  HENT: Negative.   Eyes: Negative.   Respiratory: Negative.   Cardiovascular: Negative.   Gastrointestinal: Negative.   Endocrine: Negative.   Genitourinary: Negative.   Musculoskeletal: Positive for myalgias, back pain and  arthralgias.  Skin: Negative.   Allergic/Immunologic: Negative.   Neurological: Positive for dizziness, tremors, weakness and numbness.       Trouble walking  Hematological: Negative.   Psychiatric/Behavioral: Negative.        Objective:   Physical Exam  Constitutional: He is oriented to person, place, and time.  Neurological: He is alert and oriented to person, place, and time.  Psychiatric: He has a normal mood and affect.  Nursing note and vitals reviewed.  Gen. Morbid obesity                        Extremities:Status post left great toe amputation well-healed. Has intrinsic atrophy at both  feet. No open skin lesions. Absent sensation below the ankle bilateral lower extremities. Has decreased left L3 and intact right L3 dermatomal sensory loss to pinprick. Lumbar spine has limited range of motion Mood and affect without evidence of lability or agitation Gait is with a cane     Assessment & Plan:  1. Lower extremity pain as primary complaint left little toe pain. Also has numbness in the distal legs bilaterally. 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 He also has pain that fluctuates with positioning which may be more related to his lumbar spinal stenosis. Radiographically has spinal stenosis does not appear to be severe. His most significant level radiographically is L3-L4 on the left side. The other levels are graded more in the mild range With more significant stenosis on the right side versus the left side. We'll start with a left L3-L4 transforaminal lumbar epidural injection.  Continuing on oxycodone10 mg by mouth 2-3 times a day Continuing on gabapentin 600 mg by mouth 4 times a day Both these medications may cause sedation and may contribute to his cognitive dysfunction. Will plan on adding either duloxetine 30 mg next visit or venlafaxine 37.5 mg next visit and  try reducing His oxycodone and/or gabapentin dose.  Discussed with patient agrees with plan

## 2014-07-16 NOTE — Patient Instructions (Signed)
We'll plan on left L3-L4 transforaminal injection. I believe that your pain is likely multifactorial.  We'll continue oxycodone as is for now. May be able to reduce this if you get a good relief with either Effexor or Cymbalta. Gabapentin can also cause cognitive impairment and that is another medication we can try reducing in the future

## 2014-07-17 LAB — PMP ALCOHOL METABOLITE (ETG): Ethyl Glucuronide (EtG): NEGATIVE ng/mL

## 2014-07-22 LAB — OXYCODONE, URINE (LC/MS-MS)
NOROXYCODONE, UR: 354 ng/mL (ref <50)
Oxycodone, ur: 61 ng/mL (ref <50)
Oxymorphone: 526 ng/mL (ref <50)

## 2014-07-22 LAB — TRAMADOL, URINE
N-DESMETHYL-CIS-TRAMADOL: 7107 ng/mL (ref <100)
Tramadol, Urine: 11085 ng/mL (ref <100)

## 2014-07-23 LAB — PRESCRIPTION MONITORING PROFILE (SOLSTAS)
Amphetamine/Meth: NEGATIVE ng/mL
BENZODIAZEPINE SCREEN, URINE: NEGATIVE ng/mL
Barbiturate Screen, Urine: NEGATIVE ng/mL
Buprenorphine, Urine: NEGATIVE ng/mL
CANNABINOID SCRN UR: NEGATIVE ng/mL
CARISOPRODOL, URINE: NEGATIVE ng/mL
COCAINE METABOLITES: NEGATIVE ng/mL
Creatinine, Urine: 208.82 mg/dL (ref >20.0)
ECSTASY: NEGATIVE ng/mL
FENTANYL URINE: NEGATIVE ng/mL
MEPERIDINE UR: NEGATIVE ng/mL
Methadone Screen, Urine: NEGATIVE ng/mL
NITRITES URINE, INITIAL: NEGATIVE ug/mL
Opiate Screen, Urine: NEGATIVE ng/mL
PH URINE, INITIAL: 6.6 pH (ref 4.5–8.9)
PROPOXYPHENE: NEGATIVE ng/mL
Tapentadol, urine: NEGATIVE ng/mL
ZOLPIDEM, URINE: NEGATIVE ng/mL

## 2014-07-27 NOTE — Progress Notes (Signed)
Urine drug screen for this encounter is consistent for prescribed medication.  He had run out of oxycodone and had taken tramadol from an old rx

## 2014-07-28 ENCOUNTER — Ambulatory Visit: Payer: Self-pay | Admitting: Physical Medicine & Rehabilitation

## 2014-08-03 DIAGNOSIS — Z789 Other specified health status: Secondary | ICD-10-CM

## 2014-08-03 HISTORY — DX: Other specified health status: Z78.9

## 2014-08-04 ENCOUNTER — Other Ambulatory Visit: Payer: Self-pay | Admitting: Family Medicine

## 2014-08-04 ENCOUNTER — Other Ambulatory Visit: Payer: Self-pay | Admitting: Registered Nurse

## 2014-08-04 MED ORDER — METOPROLOL SUCCINATE ER 50 MG PO TB24: 50.0000 mg | ORAL_TABLET | Freq: Every day | ORAL | Status: DC

## 2014-08-05 ENCOUNTER — Ambulatory Visit: Payer: Medicare Other

## 2014-08-13 ENCOUNTER — Ambulatory Visit: Payer: Medicare Other

## 2014-08-13 ENCOUNTER — Encounter: Payer: Medicare Other | Admitting: Registered Nurse

## 2014-08-13 ENCOUNTER — Other Ambulatory Visit: Payer: Self-pay

## 2014-08-13 MED ORDER — NIACIN ER 1000 MG PO TBCR: 1000.0000 mg | EXTENDED_RELEASE_TABLET | Freq: Two times a day (BID) | ORAL | Status: DC

## 2014-08-17 ENCOUNTER — Other Ambulatory Visit: Payer: Self-pay | Admitting: Nurse Practitioner

## 2014-08-17 MED ORDER — EZETIMIBE 10 MG PO TABS: 10.0000 mg | ORAL_TABLET | Freq: Every day | ORAL | Status: DC

## 2014-08-18 ENCOUNTER — Encounter: Payer: Medicare Other | Attending: Registered Nurse | Admitting: Registered Nurse

## 2014-08-18 ENCOUNTER — Ambulatory Visit: Payer: Self-pay | Admitting: Registered Nurse

## 2014-08-18 ENCOUNTER — Encounter: Payer: Self-pay | Admitting: Registered Nurse

## 2014-08-18 VITALS — BP 154/59 | HR 72 | Resp 14

## 2014-08-18 DIAGNOSIS — Z79899 Other long term (current) drug therapy: Secondary | ICD-10-CM

## 2014-08-18 DIAGNOSIS — I2510 Atherosclerotic heart disease of native coronary artery without angina pectoris: Secondary | ICD-10-CM | POA: Diagnosis not present

## 2014-08-18 DIAGNOSIS — G8929 Other chronic pain: Secondary | ICD-10-CM

## 2014-08-18 DIAGNOSIS — J449 Chronic obstructive pulmonary disease, unspecified: Secondary | ICD-10-CM | POA: Insufficient documentation

## 2014-08-18 DIAGNOSIS — Z5181 Encounter for therapeutic drug level monitoring: Secondary | ICD-10-CM

## 2014-08-18 DIAGNOSIS — G608 Other hereditary and idiopathic neuropathies: Secondary | ICD-10-CM

## 2014-08-18 DIAGNOSIS — K219 Gastro-esophageal reflux disease without esophagitis: Secondary | ICD-10-CM | POA: Insufficient documentation

## 2014-08-18 DIAGNOSIS — M545 Low back pain, unspecified: Secondary | ICD-10-CM

## 2014-08-18 DIAGNOSIS — M4806 Spinal stenosis, lumbar region: Secondary | ICD-10-CM

## 2014-08-18 DIAGNOSIS — M109 Gout, unspecified: Secondary | ICD-10-CM | POA: Insufficient documentation

## 2014-08-18 DIAGNOSIS — G609 Hereditary and idiopathic neuropathy, unspecified: Secondary | ICD-10-CM | POA: Diagnosis not present

## 2014-08-18 DIAGNOSIS — E780 Pure hypercholesterolemia: Secondary | ICD-10-CM | POA: Diagnosis not present

## 2014-08-18 DIAGNOSIS — M1990 Unspecified osteoarthritis, unspecified site: Secondary | ICD-10-CM | POA: Diagnosis not present

## 2014-08-18 DIAGNOSIS — M48062 Spinal stenosis, lumbar region with neurogenic claudication: Secondary | ICD-10-CM

## 2014-08-18 DIAGNOSIS — M79672 Pain in left foot: Secondary | ICD-10-CM | POA: Diagnosis present

## 2014-08-18 MED ORDER — OXYCODONE-ACETAMINOPHEN 10-325 MG PO TABS: 1.0000 | ORAL_TABLET | Freq: Three times a day (TID) | ORAL | Status: DC | PRN

## 2014-08-18 NOTE — Progress Notes (Signed)
Subjective:    Patient ID: Tim [name], male    DOB: 09-Jan-1941, 74 y.o.   MRN: [id number]  HPI: [name] is a 74 year old male who returns for follow up for chronic pain and medication refill. His pain is located in his lower back.He rates his pain 3. His current exercise regime is walking daily 10 minutes. He states" since he's using his TENS Unit nightly he's no longer having pain in his feet.  Since he's no longer having neuropathy pain, he doesn't want to start the Cymbalta or Effexor. Will continue with current medication regime and monitor. He verbalizes understanding. He went for Cognitive testing will find out  results on 08/20/14. His neurologist Dr. Posey Pronto referred him.  Pain Inventory Average Pain 3 Pain Right Now 3 My pain is sharp and stabbing  In the last 24 hours, has pain interfered with the following? General activity 8 Relation with others 7 Enjoyment of life 6 What TIME of day is your pain at its worst? evening, night Sleep (in general) Poor  Pain is worse with: walking, bending, standing and some activites Pain improves with: rest, pacing activities, medication and TENS Relief from Meds: 6  Mobility use a cane how many minutes can you walk? 10 ability to climb steps?  yes do you drive?  yes  Function not employed: date last employed . retired I need assistance with the following:  meal prep, household duties and shopping  Neuro/Psych weakness numbness tremor tingling trouble walking  Prior Studies Any changes since last visit?  no  Physicians involved in your care Any changes since last visit?  no   Family History  Problem Relation Age of Onset  . Hypertension Mother   . Coronary artery disease Mother   . Heart attack Mother   . Pulmonary fibrosis Father     asbestosis  . Neuropathy Mother    History   Social History  . Marital Status: Married    Spouse Name: [name]  . Number of Children: 0  . Years of Education: 20    Occupational History  . engineering     retired   Social History Main Topics  . Smoking status: Never Smoker   . Smokeless tobacco: Never Used  . Alcohol Use: No  . Drug Use: No  . Sexual Activity: Not Currently   Other Topics Concern  . None   Social History Narrative   HSG, [name] - BS, [name] - MS-engineering, 2 years on PhD - [name]. Married - '65 - 78yr/divorced; '76- 3 yrs/divorced; '92 . No children. 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 ACP/Living Will - Yes CPR; long-term Mechanical ventilation as long as he was able to cognate; ok for long term artificial nutrition. Precondition being able to cognate and not to have too much pain.    Past Surgical History  Procedure Laterality Date  . Hemorrhoid surgery    . Vitrectomy    . Intraocular lens insertion Bilateral   . Leg surgery Bilateral   . Knee surgery Right   . Traeculectomy Left     eye  . Shoulder arthroscopy  08/30/2011    Procedure: ARTHROSCOPY SHOULDER;  Surgeon: [name] MD;  Location: [facility]  Service: Orthopedics;  Laterality: Right;  Right Shoulder Arthroscopy, Debridement, and Decompression  . Tonsillectomy    . Colonoscopy    . Cholecystectomy    . Toe amputation Left     due  to osteomyelitis.  R big toe surg due to osteoarth  . Colonoscopy [name] 10/30/2012    Procedure: COLONOSCOPY;  Surgeon: [name], MD;  Location: [facility];  Service: Endoscopy;  Laterality: [name];  . Cardiac catheterization  1997    Non-obstructive disease  . Cardiac catheterization  12/24/2012    mild < 20% LCx, prox 30% RCA; LVEF 55-65% , moderate pulmonary HTN, moderate AS  . Eye surgery Bilateral cataract  . Amputation Left 04/11/2013    Procedure: AMPUTATION DIGIT Left 3rd toe;  Surgeon: [name], MD;  Location: [facility];  Service: Orthopedics;  Laterality: Left;  Left 3rd toe amputation at [facility]  . Left and right heart catheterization with coronary angiogram [name] 12/24/2012     Procedure: LEFT AND RIGHT HEART CATHETERIZATION WITH CORONARY ANGIOGRAM;  Surgeon: [name], MD;  [facility]: [facility] C[facility];  Service: Cardiovascular;  Laterality: [name];   Past Medical History  Diagnosis Date  . HYPERCHOLESTEROLEMIA-PURE   . GOUT   . OSA on CPAP     8 cm H2O  . Essential hypertension, benign   . VENOUS INSUFFICIENCY   . ASTHMA   . COPD   . VENTRAL HERNIA   . OSTEOARTHRITIS   . CATARACT, HX OF   . PULMONARY HYPERTENSION, HX OF   . Hallux valgus (acquired)   . Obesity, unspecified   . Osteoarthrosis, unspecified whether generalized or localized, unspecified site   . Unspecified glaucoma   . Moderate aortic stenosis     Per 12/2012 echo- (VTI 1.08 cm^2, Vmax 1.11 cm ^2), mild LA dilatation  . Paroxysmal atrial fibrillation 2003  . Lumbosacral neuritis   . Unspecified hereditary and idiopathic peripheral neuropathy approx age 6    bilat LE's, ? left arm, too.  Feet became progressively numb + left foot pain intermittently.  . Lumbosacral spondylosis   . CAD (coronary artery disease)     Nonobstructive by 12/2012 cath  . PUD (peptic ulcer disease)   . BPH (benign prostatic hypertrophy)   . HH (hiatus hernia)   . Glaucoma   . GERD (gastroesophageal reflux disease)   . Personal history of colonic adenoma 10/30/2012  . Shortness of breath     with exertion  . Peripheral neuropathy     Heredetary; with chronic neuropathic pain   BP 154/59 mmHg  Pulse 72  Resp 14  SpO2 96%  Opioid Risk Score:   Fall Risk Score: Moderate Fall Risk (6-13 points) (pt starts physical therapy in a couple of weeks, declined safety pamphlet during todays visit)  Review of Systems  Constitutional:       Weight gain  Respiratory: Positive for apnea and shortness of breath.   Cardiovascular: Positive for leg swelling.  Gastrointestinal: Positive for constipation.  Musculoskeletal: Positive for gait problem.  Neurological: Positive for tremors, weakness and numbness.        Tingling  All other systems reviewed and are negative.      Objective:   Physical Exam  Constitutional: He is oriented to person, place, and time. He appears well-developed and well-nourished.  HENT:  Head: Normocephalic and atraumatic.  Neck: Normal range of motion. Neck supple.  Cardiovascular: Normal rate and regular rhythm.   Pulmonary/Chest: Effort normal and breath sounds normal.  Musculoskeletal:  Normal Muscle Bulk and Muscle Testing Reveals: Upper Extremities: Right: Decreased ROM 45 Degrees and Muscle strength 5/5 Left: Full ROM and Muscle strength 5/5 Thoracic Paraspinal Tenderness: T-3- T-5 Lumbar Paraspinal tenderness: L-3- L-4 Lower  Extremities: Full ROM and Muscle strength 5/5 Arises from chair with ease/ Using straight cane for support  Neurological: He is alert and oriented to person, place, and time.  Skin: Skin is warm and dry.  Psychiatric: He has a normal mood and affect.  Nursing note and vitals reviewed.         Assessment & Plan:  1. 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  Refilled: Percocet 10/325 mg ---use one every 8 hours as needed for pain #70. Continue with Gabapentin.  2. DDD lumbar spine with LBP: Neurology Following. Continue with activity and heat therapy.   20 minutes of face to face patient care time was spent during this visit. All questions were encouraged and answered.   F/U in 1 month

## 2014-08-21 ENCOUNTER — Telehealth: Payer: Self-pay | Admitting: Cardiology

## 2014-08-21 MED ORDER — NIACIN ER 1000 MG PO TBCR: 1000.0000 mg | EXTENDED_RELEASE_TABLET | Freq: Two times a day (BID) | ORAL | Status: DC

## 2014-08-21 NOTE — Telephone Encounter (Signed)
Left message for patient, new refill sent to the pharmacy.

## 2014-08-21 NOTE — Telephone Encounter (Signed)
New message      Pt has been taking niaspan 1019m bid but his new presc was 10059mdaily.  Has the dosage changed?

## 2014-08-25 ENCOUNTER — Encounter: Payer: Self-pay | Admitting: Physical Therapy

## 2014-08-25 ENCOUNTER — Ambulatory Visit: Payer: Medicare Other | Attending: Family Medicine | Admitting: Physical Therapy

## 2014-08-25 DIAGNOSIS — Z7409 Other reduced mobility: Secondary | ICD-10-CM

## 2014-08-25 DIAGNOSIS — R6889 Other general symptoms and signs: Secondary | ICD-10-CM

## 2014-08-25 DIAGNOSIS — R269 Unspecified abnormalities of gait and mobility: Secondary | ICD-10-CM | POA: Insufficient documentation

## 2014-08-25 DIAGNOSIS — G629 Polyneuropathy, unspecified: Secondary | ICD-10-CM | POA: Insufficient documentation

## 2014-08-25 DIAGNOSIS — R29898 Other symptoms and signs involving the musculoskeletal system: Secondary | ICD-10-CM

## 2014-08-25 DIAGNOSIS — R531 Weakness: Secondary | ICD-10-CM

## 2014-08-26 ENCOUNTER — Encounter: Payer: Self-pay | Admitting: Physical Therapy

## 2014-08-26 NOTE — Therapy (Signed)
[facility] [address] [place], [place], [address] Phone: [phone]   Fax:  [phone]  Physical Therapy Evaluation  Patient Details  Name: Tim [name] MRN: [id number] Date of Birth: 1940/12/04 Referring Provider:  Tammi Sou, MD  Encounter Date: 08/25/2014      PT End of Session - 08/26/14 0745    Visit Number 1  G1   Number of Visits 9   Date for PT Re-Evaluation 09/24/14   Authorization Type [employer] Medicare   Authorization Time Period 08-25-14 - 10-24-14   PT Start Time 1405   PT Stop Time 1445   PT Time Calculation (min) 40 min      Past Medical History  Diagnosis Date  . HYPERCHOLESTEROLEMIA-PURE   . GOUT   . OSA on CPAP     8 cm H2O  . Essential hypertension, benign   . VENOUS INSUFFICIENCY   . ASTHMA   . COPD   . VENTRAL HERNIA   . OSTEOARTHRITIS   . CATARACT, HX OF   . PULMONARY HYPERTENSION, HX OF   . Hallux valgus (acquired)   . Obesity, unspecified   . Osteoarthrosis, unspecified whether generalized or localized, unspecified site   . Unspecified glaucoma   . Moderate aortic stenosis     Per 12/2012 echo- (VTI 1.08 cm^2, Vmax 1.11 cm ^2), mild LA dilatation  . Paroxysmal atrial fibrillation 2003  . Lumbosacral neuritis   . Unspecified hereditary and idiopathic peripheral neuropathy approx age 52    bilat LE's, ? left arm, too.  Feet became progressively numb + left foot pain intermittently.  . Lumbosacral spondylosis   . CAD (coronary artery disease)     Nonobstructive by 12/2012 cath  . PUD (peptic ulcer disease)   . BPH (benign prostatic hypertrophy)   . HH (hiatus hernia)   . Glaucoma   . GERD (gastroesophageal reflux disease)   . Personal history of colonic adenoma 10/30/2012  . Shortness of breath     with exertion  . Peripheral neuropathy     Heredetary; with chronic neuropathic pain    Past Surgical History  Procedure Laterality Date  . Hemorrhoid surgery    .  Vitrectomy    . Intraocular lens insertion Bilateral   . Leg surgery Bilateral   . Knee surgery Right   . Traeculectomy Left     eye  . Shoulder arthroscopy  08/30/2011    Procedure: ARTHROSCOPY SHOULDER;  Surgeon: [name], MD;  Location: [facility];  Service: Orthopedics;  Laterality: Right;  Right Shoulder Arthroscopy, Debridement, and Decompression  . Tonsillectomy    . Colonoscopy    . Cholecystectomy    . 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  R big toe surg due to osteoarth  . Colonoscopy [name] 10/30/2012    Procedure: COLONOSCOPY;  Surgeon: [name], MD;  Location: [facility];  Service: Endoscopy;  Laterality: [name];  . Cardiac catheterization  1997    Non-obstructive disease  . Cardiac catheterization  12/24/2012    mild < 20% LCx, prox 30% RCA; LVEF 55-65% , moderate pulmonary HTN, moderate AS  . Eye surgery Bilateral cataract  . Amputation Left 04/11/2013    Procedure: AMPUTATION DIGIT Left 3rd toe;  Surgeon: [name], MD;  Location: [facility];  Service: Orthopedics;  Laterality: Left;  Left 3rd toe amputation at [facility]  . Left and right heart catheterization with coronary angiogram [name] 12/24/2012  Procedure: LEFT AND RIGHT HEART CATHETERIZATION WITH CORONARY ANGIOGRAM;  Surgeon: [name], MD;  Location: [name] CATH LAB;  Service: Cardiovascular;  Laterality: [name];    There were no vitals taken for this visit.  Visit Diagnosis:  Abnormality of gait - Plan: PT plan of care cert/re-cert  Bilateral leg weakness - Plan: PT plan of care cert/re-cert  Decreased strength, endurance, and mobility - Plan: PT plan of care cert/re-cert      Subjective Assessment - 08/26/14 0739    Symptoms Pt. reports bil. LE weakness for about 2 years; reports bil. LE neuropathy diagnosed about 30 years ago but not symptomatic at that time; has noticed numbness in both feet about 10-15 years ago;    Pertinent History R shoulder sx 2 years ago;  back injury 40 yrs ago; has used cane  since about 10 yrs ago    How long can you stand comfortably? about 1 minute   How long can you walk comfortably? 10 minutes   Patient Stated Goals increase leg strength   Currently in Pain? No/denies          [facility] PT Assessment - 08/26/14 0001    Assessment   Medical Diagnosis Peripheral Neuropathy   Onset Date --  several years ago; June 2015 for decline in mobility   Balance Screen   Has the patient fallen in the past 6 months No   Has the patient had a decrease in activity level because of a fear of falling?  Yes   Prior Function   Level of Independence Independent with homemaking with ambulation   Leisure pt. plays in 2 bands   Ambulation/Gait   Ambulation/Gait Yes   Ambulation/Gait Assistance 6: Modified independent (Device/Increase time)   Ambulation Distance (Feet) 100 Feet   Assistive device None   Ambulation Surface Level   Gait velocity 2.54  with cane   Berg Balance Test   Sit to Stand Able to stand without using hands and stabilize independently   Standing Unsupported Able to stand safely 2 minutes   Sitting with Back Unsupported but Feet Supported on Floor or Stool Able to sit safely and securely 2 minutes   Stand to Sit Sits safely with minimal use of hands   Transfers Able to transfer safely, minor use of hands   Standing Unsupported with Eyes Closed Able to stand 10 seconds with supervision   Standing Ubsupported with Feet Together Able to place feet together independently and stand for 1 minute with supervision   From Standing, Reach Forward with Outstretched Arm Can reach confidently >25 cm (10")   From Standing Position, Pick up Object from Floor Unable to try/needs assist to keep balance   From Standing Position, Turn to Look Behind Over each Shoulder Turn sideways only but maintains balance   Turn 360 Degrees Able to turn 360 degrees safely but slowly   Standing Unsupported, Alternately Place Feet on Step/Stool Able to complete >2 steps/needs minimal  assist   Standing Unsupported, One Foot in Front Able to plae foot ahead of the other independently and hold 30 seconds   Standing on One Leg Able to lift leg independently and hold 5-10 seconds   Total Score 41   Timed Up and Go Test   Normal TUG (seconds) 11.87  with cane                            PT Short Term Goals - 08/26/14  0752    PT SHORT TERM GOAL #1   Title same as LTG's           PT Long Term Goals - 08-27-14 0804    PT LONG TERM GOAL #1   Title Pt. will increase Berg balance test score to >/= 47/56 to decr. fall risk.   Baseline 41/56            target date 09-24-14   Time 4   Period Weeks   Status New   PT LONG TERM GOAL #2   Title Increase endurance/activity tolerance so pt. able to ambulate 5" with cane without requiring a seated rest period.   Baseline 09-24-14   Time 4   Period Weeks   Status New   PT LONG TERM GOAL #3   Title Pt. 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 gait velocity to >/= 2.9 ft/sec without cane for incr. gait efficiency.   Baseline 09-24-14   Time 4   Period Weeks   Status New   PT LONG TERM GOAL #5   Title Independent in HEP for balance and strengthening.   Baseline 09-24-14   Time 4   Period Weeks   Status New               Plan - 2014/08/27 0804    Pt will benefit from skilled therapeutic intervention in order to improve on the following deficits Difficulty walking;Decreased activity tolerance;Decreased endurance;Impaired sensation;Obesity;Decreased balance;Decreased mobility;Decreased strength   Rehab Potential Good   PT Frequency 2x / week   PT Duration 4 weeks   PT Treatment/Interventions ADLs/Self Care Home Management;Therapeutic activities;Patient/family education;Therapeutic exercise;Gait training;Balance training;Stair training;Neuromuscular  re-education;Functional mobility training   PT Next Visit Plan HEP for bil. LE strengthening and balance exercises   Consulted and Agree with Plan of Care Patient          G-Codes - 08/27/2014 0819    Functional Assessment Tool Used TUG 11.87 with cane:  gait velocity 2.54 ft/sec with cane; Berg score 41/56   Functional Limitation Mobility: Walking and moving around   Mobility: Walking and Moving Around Current Status [phone]) At least 20 percent but less than 40 percent impaired, limited or restricted   Mobility: Walking and Moving Around Goal Status [phone]) At least 1 percent but less than 20 percent impaired, limited or restricted       Problem List Patient Active Problem List   Diagnosis Date Noted  . Postural dizziness 05/24/2014  . Paresthesias with subjective weakness 01/30/2014  . Bilateral thigh pain 01/30/2014  . Abdominal wall pain in right upper quadrant 01/22/2014  . Gross hematuria 09/25/2013  . Intertrigo 09/25/2013  . IBS (irritable bowel syndrome) 09/11/2013  . Chronic pain syndrome 08/06/2013  . CAD (coronary artery disease), native coronary artery 12/25/2012  . Paroxysmal atrial fibrillation 12/25/2012  . Moderate aortic stenosis 12/25/2012  . Aortic stenosis 12/24/2012  . Personal history of colonic adenoma 10/30/2012  . Diverticulosis of colon (without mention of hemorrhage) 10/30/2012  . Internal hemorrhoids 10/30/2012  . Change in bowel habits intermittent loose stools 10/30/2012  . Routine health maintenance 06/09/2012  . Hereditary peripheral neuropathy 10/10/2011  . Long term current use of anticoagulant - apixiban 08/16/2010  . DYSPNEA/SHORTNESS OF BREATH 09/08/2009  . HYPERCHOLESTEROLEMIA-PURE 01/13/2009  . Essential  hypertension, benign 01/13/2009  . EDEMA 01/13/2009  . GOUT 01/12/2009  . GLAUCOMA 01/12/2009  . VENOUS INSUFFICIENCY 01/12/2009  . COPD 01/12/2009  . VENTRAL HERNIA 01/12/2009  . OSTEOARTHRITIS 01/12/2009  . ARTHRITIS 01/12/2009   . DEGENERATIVE DISC DISEASE 01/12/2009  . CATARACT, HX OF 01/12/2009  . HEART MURMUR, HX OF 01/12/2009  . BENIGN PROSTATIC HYPERTROPHY, HX OF 01/12/2009  . Obesity 01/17/2007  . OBSTRUCTIVE SLEEP APNEA 01/17/2007  . ASTHMA 01/17/2007  . GERD 01/17/2007  . HALLUX VALGUS, ACQUIRED 01/17/2007  . PULMONARY HYPERTENSION, HX OF 01/17/2007    Alda Lea, PT 08/26/2014, 8:25 AM  [facility] [address] [place], [place], [address] Phone: [phone]   Fax:  [phone]

## 2014-08-27 ENCOUNTER — Encounter: Payer: Self-pay | Admitting: Family Medicine

## 2014-08-31 ENCOUNTER — Ambulatory Visit: Payer: Self-pay | Admitting: Cardiology

## 2014-08-31 DIAGNOSIS — Z7901 Long term (current) use of anticoagulants: Secondary | ICD-10-CM

## 2014-09-01 ENCOUNTER — Ambulatory Visit: Payer: Medicare Other | Attending: Family Medicine | Admitting: Physical Therapy

## 2014-09-01 DIAGNOSIS — G629 Polyneuropathy, unspecified: Secondary | ICD-10-CM | POA: Diagnosis not present

## 2014-09-01 DIAGNOSIS — R29898 Other symptoms and signs involving the musculoskeletal system: Secondary | ICD-10-CM

## 2014-09-01 DIAGNOSIS — R269 Unspecified abnormalities of gait and mobility: Secondary | ICD-10-CM | POA: Insufficient documentation

## 2014-09-01 NOTE — Patient Instructions (Addendum)
Backward Walking   Hold counter for support.  Walk backward, toes of each foot coming down first. Take long, even strides. Make sure you have a clear pathway with no obstructions when you do this.   Copyright  VHI. All rights reserved.  Strengthening: Knee Flexion (Standing)   With support, bend right knee as far as possible. Repeat 10 times per set. Do 2 sessions per day.  http://orth.exer.us/628   Copyright  VHI. All rights reserved.  Hip Extension (Standing)   Stand with support. Squeeze pelvic floor and hold. Move leg backward with straight knee.  Alternate legs. Repeat 10 times. Do 2 times a day.    Copyright  VHI. All rights reserved.  Side-Stepping   Hold counter for support.  Walk to left side. Take even steps, leading with same foot. Make sure each foot lifts off the floor. Repeat in opposite direction. Repeat for 8' x 6. Do 2 sessions per day.   Copyright  VHI. All rights reserved.  Tandem Stance   Hold counter for support.  Right foot in front of left, heel touching toe both feet "straight ahead". Stand on Foot Triangle of Support with both feet. Balance in this position 10 seconds. Do with left foot in front of right.  Repeat 3 times on each leg.  Copyright  VHI. All rights reserved.  SINGLE LIMB STANCE   Hold counter for support.  Stance: single leg on floor. Raise leg. Hold 10 seconds. Repeat with other leg. Repeat 3 times on each leg.  Do 2 sets per day.  Copyright  VHI. All rights reserved.  "I love a Psychiatrist for support.  March in place alternating legs as high as you can. Repeat 10 times. Do 2 sessions per day.  http://gt2.exer.us/344   Copyright  VHI. All rights reserved.  ABDUCTION: Standing (Active)   Stand, feet flat. Lift right leg out to side. Repeat on other leg.   Complete 10 repetitions. Perform 2 sessions per day.  http://gtsc.exer.us/110   Copyright  VHI. All rights reserved.

## 2014-09-01 NOTE — Therapy (Signed)
[facility] [address] [place], [place], [address] Phone: [phone]   Fax:  [phone]  Physical Therapy Treatment  Patient Details  Name: Tim [name] MRN: [id number] Date of Birth: 09/07/1940 Referring Provider:  Tammi Sou, MD  Encounter Date: 09/01/2014      PT End of Session - 09/01/14 1713    Visit Number 2   Number of Visits 9   Date for PT Re-Evaluation 09/24/14   Authorization Type [employer] Medicare   Authorization Time Period 08-25-14 - 10-24-14   PT Start Time 1405   PT Stop Time 1448   PT Time Calculation (min) 43 min   Activity Tolerance Patient limited by fatigue  needed several seated rest breaks   Behavior During Therapy [facility] for tasks assessed/performed      Past Medical History  Diagnosis Date  . HYPERCHOLESTEROLEMIA-PURE   . GOUT   . OSA on CPAP     8 cm H2O  . Essential hypertension, benign   . VENOUS INSUFFICIENCY   . ASTHMA   . COPD   . VENTRAL HERNIA   . OSTEOARTHRITIS   . CATARACT, HX OF   . PULMONARY HYPERTENSION, HX OF   . Hallux valgus (acquired)   . Obesity, unspecified   . Osteoarthrosis, unspecified whether generalized or localized, unspecified site   . Unspecified glaucoma   . Moderate aortic stenosis     Per 12/2012 echo- (VTI 1.08 cm^2, Vmax 1.11 cm ^2), mild LA dilatation  . Paroxysmal atrial fibrillation 2003  . Lumbosacral neuritis   . Unspecified hereditary and idiopathic peripheral neuropathy approx age 74    bilat LE's, ? left arm, too.  Feet became progressively numb + left foot pain intermittently.  . Lumbosacral spondylosis   . CAD (coronary artery disease)     Nonobstructive by 12/2012 cath  . PUD (peptic ulcer disease)   . BPH (benign prostatic hypertrophy)   . HH (hiatus hernia)   . Glaucoma   . GERD (gastroesophageal reflux disease)   . Personal history of colonic adenoma 10/30/73  . Shortness of breath     with exertion  . Peripheral  neuropathy     Heredetary; with chronic neuropathic pain  . Normal memory function 08/2014    Neuropsychological testing ([facility]): no cognitive impairment or sign of neurodegenerative disorder.  Likely has adjustment d/o with mixed anxiety/depressed features and may benefit from low dose SNRI.      Past Surgical History  Procedure Laterality Date  . Hemorrhoid surgery    . Vitrectomy    . Intraocular lens insertion Bilateral   . Leg surgery Bilateral   . Knee surgery Right   . Traeculectomy Left     eye  . Shoulder arthroscopy  08/30/2011    Procedure: ARTHROSCOPY SHOULDER;  Surgeon: [name], MD;  Location: [facility];  Service: Orthopedics;  Laterality: Right;  Right Shoulder Arthroscopy, Debridement, and Decompression  . Tonsillectomy    . Colonoscopy    . Cholecystectomy    . 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  R big toe surg due to osteoarth  . Colonoscopy [name] 10/30/2012    Procedure: COLONOSCOPY;  Surgeon: [name], MD;  Location: [facility];  Service: Endoscopy;  Laterality: [name];  . Cardiac catheterization  1997    Non-obstructive disease  . Cardiac catheterization  12/24/2012    mild < 20% LCx, prox 30% RCA; LVEF 55-65% , moderate pulmonary HTN,  moderate AS  . Eye surgery Bilateral cataract  . Amputation Left 04/11/2013    Procedure: AMPUTATION DIGIT Left 3rd toe;  Surgeon: [name], MD;  Location: [facility];  Service: Orthopedics;  Laterality: Left;  Left 3rd toe amputation at [facility]  . Left and right heart catheterization with coronary angiogram [name] 12/24/2012    Procedure: LEFT AND RIGHT HEART CATHETERIZATION WITH CORONARY ANGIOGRAM;  Surgeon: [name], MD;  Location: [facility] CATH LAB;  Service: Cardiovascular;  Laterality: [name];    There were no vitals taken for this visit.  Visit Diagnosis:  Bilateral leg weakness      Subjective Assessment - 09/01/14 1705    Symptoms Pt denies changes since last visit.  Does report that he has  episodes of orthostatic hypotension but will let us know when he feels it occuring.   Currently in Pain? No/denies                    [facility] Adult PT Treatment/Exercise - 09/01/14 0001    Knee/Hip Exercises: Standing   Other Standing Knee Exercises Standing at counter for bil hip abd, hip extension, marching, hamstring curl all x 10 reps alternating LE's   Other Standing Knee Exercises Standing at counter for side stepping, forward/backward walking, SLS, Tandem stance.                PT Education - 09/01/14 1713    Education provided Yes   Education Details HEP   Person(s) Educated Patient   Methods Explanation;Demonstration;Handout   Comprehension Verbalized understanding;Need further instruction          PT Short Term Goals - 08/26/14 0752    PT SHORT TERM GOAL #1   Title same as LTG's           PT Long Term Goals - 08/26/14 0804    PT LONG TERM GOAL #1   Title Pt. will increase Berg balance test score to >/= 47/56 to decr. fall risk.   Baseline 41/56            target date 09-24-14   Time 4   Period Weeks   Status New   PT LONG TERM GOAL #2   Title Increase endurance/activity tolerance so pt. able to ambulate 5" with cane without requiring a seated rest period.   Baseline 09-24-14   Time 4   Period Weeks   Status New   PT LONG TERM GOAL #3   Title Pt. 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 gait velocity to >/= 2.9 ft/sec without cane for incr. gait efficiency.   Baseline 09-24-14   Time 4   Period Weeks   Status New   PT LONG TERM GOAL #5   Title Independent in HEP for balance and strengthening.   Baseline 09-24-14   Time 4   Period Weeks   Status New               Plan - 09/01/14 1714    Clinical Impression Statement Initiated HEP.  Pt required 2 seated rest breaks during session but only near  end of session.  Continue PT per POC>   Pt will benefit from skilled therapeutic intervention in order to improve on the following deficits Difficulty walking;Decreased activity tolerance;Decreased endurance;Impaired sensation;Obesity;Decreased balance;Decreased  mobility;Decreased strength   Rehab Potential Good   PT Frequency 2x / week   PT Duration 4 weeks   PT Treatment/Interventions ADLs/Self Care Home Management;Therapeutic activities;Patient/family education;Therapeutic exercise;Gait training;Balance training;Stair training;Neuromuscular re-education;Functional mobility training   PT Next Visit Plan Review HEP.  Scifit for endurance.   Consulted and Agree with Plan of Care Patient        Problem List Patient Active Problem List   Diagnosis Date Noted  . Postural dizziness 05/24/2014  . Paresthesias with subjective weakness 01/30/2014  . Bilateral thigh pain 01/30/2014  . Abdominal wall pain in right upper quadrant 01/22/2014  . Gross hematuria 09/25/2013  . Intertrigo 09/25/2013  . IBS (irritable bowel syndrome) 09/11/2013  . Chronic pain syndrome 08/06/2013  . CAD (coronary artery disease), native coronary artery 12/25/2012  . Paroxysmal atrial fibrillation 12/25/2012  . Moderate aortic stenosis 12/25/2012  . Aortic stenosis 12/24/2012  . Personal history of colonic adenoma 10/30/2012  . Diverticulosis of colon (without mention of hemorrhage) 10/30/2012  . Internal hemorrhoids 10/30/2012  . Change in bowel habits intermittent loose stools 10/30/2012  . Routine health maintenance 06/09/2012  . Hereditary peripheral neuropathy 10/10/2011  . Long term current use of anticoagulant - apixiban 08/16/2010  . DYSPNEA/SHORTNESS OF BREATH 09/08/2009  . HYPERCHOLESTEROLEMIA-PURE 01/13/2009  . Essential hypertension, benign 01/13/2009  . EDEMA 01/13/2009  . GOUT 01/12/2009  . GLAUCOMA 01/12/2009  . VENOUS INSUFFICIENCY 01/12/2009  . COPD 01/12/2009  . VENTRAL HERNIA 01/12/2009   . OSTEOARTHRITIS 01/12/2009  . ARTHRITIS 01/12/2009  . DEGENERATIVE DISC DISEASE 01/12/2009  . CATARACT, HX OF 01/12/2009  . HEART MURMUR, HX OF 01/12/2009  . BENIGN PROSTATIC HYPERTROPHY, HX OF 01/12/2009  . Obesity 01/17/2007  . OBSTRUCTIVE SLEEP APNEA 01/17/2007  . ASTHMA 01/17/2007  . GERD 01/17/2007  . HALLUX VALGUS, ACQUIRED 01/17/2007  . PULMONARY HYPERTENSION, HX OF 01/17/2007    [place], [place] 09/01/2014, 5:19 PM  [facility] [address] [place], [place], [address] Phone: [phone]   Fax:  [place], [place] 09/01/2014 5:19 PM Phone: [phone] Fax: [phone]

## 2014-09-03 ENCOUNTER — Other Ambulatory Visit: Payer: Self-pay | Admitting: Family Medicine

## 2014-09-03 MED ORDER — ALLOPURINOL 300 MG PO TABS: 300.0000 mg | ORAL_TABLET | Freq: Every day | ORAL | Status: DC

## 2014-09-08 ENCOUNTER — Ambulatory Visit: Payer: Medicare Other | Admitting: Physical Therapy

## 2014-09-08 DIAGNOSIS — R269 Unspecified abnormalities of gait and mobility: Secondary | ICD-10-CM | POA: Diagnosis not present

## 2014-09-08 DIAGNOSIS — R29898 Other symptoms and signs involving the musculoskeletal system: Secondary | ICD-10-CM

## 2014-09-09 ENCOUNTER — Encounter: Payer: Self-pay | Admitting: Physical Therapy

## 2014-09-09 NOTE — Therapy (Signed)
[facility] [address] [place], [place], [address] Phone: [phone]   Fax:  [phone]  Physical Therapy Treatment  Patient Details  Name: Tim [name] MRN: [id number] Date of Birth: 1940-12-11 Referring Provider:  Tammi Sou, MD  Encounter Date: 09/08/2014      PT End of Session - 09/09/14 1640    Visit Number 3  G3   Number of Visits 9   Date for PT Re-Evaluation 09/24/14   Authorization Type [employer] Medicare   Authorization Time Period 08-25-14 - 10-24-14   PT Start Time 5400   PT Stop Time 1423   PT Time Calculation (min) 1368 min      Past Medical History  Diagnosis Date  . HYPERCHOLESTEROLEMIA-PURE   . GOUT   . OSA on CPAP     8 cm H2O  . Essential hypertension, benign   . VENOUS INSUFFICIENCY   . ASTHMA   . COPD   . VENTRAL HERNIA   . OSTEOARTHRITIS   . CATARACT, HX OF   . PULMONARY HYPERTENSION, HX OF   . Hallux valgus (acquired)   . Obesity, unspecified   . Osteoarthrosis, unspecified whether generalized or localized, unspecified site   . Unspecified glaucoma   . Moderate aortic stenosis     Per 12/2012 echo- (VTI 1.08 cm^2, Vmax 1.11 cm ^2), mild LA dilatation  . Paroxysmal atrial fibrillation 2003  . Lumbosacral neuritis   . Unspecified hereditary and idiopathic peripheral neuropathy approx age 74    bilat LE's, ? left arm, too.  Feet became progressively numb + left foot pain intermittently.  . Lumbosacral spondylosis   . CAD (coronary artery disease)     Nonobstructive by 12/2012 cath  . PUD (peptic ulcer disease)   . BPH (benign prostatic hypertrophy)   . HH (hiatus hernia)   . Glaucoma   . GERD (gastroesophageal reflux disease)   . Personal history of colonic adenoma 10/30/2012  . Shortness of breath     with exertion  . Peripheral neuropathy     Heredetary; with chronic neuropathic pain  . Normal memory function 75/2016    Neuropsychological testing ([facility]): no cognitive impairment or sign of neurodegenerative disorder.  Likely has adjustment d/o with mixed anxiety/depressed features and may benefit from low dose SNRI.      Past Surgical History  Procedure Laterality Date  . Hemorrhoid surgery    . Vitrectomy    . Intraocular lens insertion Bilateral   . Leg surgery Bilateral   . Knee surgery Right   . Traeculectomy Left     eye  . Shoulder arthroscopy  08/30/2011    Procedure: ARTHROSCOPY SHOULDER;  Surgeon: [name], MD;  Location: [facility];  Service: Orthopedics;  Laterality: Right;  Right Shoulder Arthroscopy, Debridement, and Decompression  . Tonsillectomy    . Colonoscopy    . Cholecystectomy    . 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  R big toe surg due to osteoarth  . Colonoscopy [name] 10/30/2012    Procedure: COLONOSCOPY;  Surgeon: [name], MD;  Location: [facility];  Service: Endoscopy;  Laterality: [name];  . Cardiac catheterization  1997    Non-obstructive disease  . Cardiac catheterization  12/24/2012    mild < 20% LCx, prox 30% RCA; LVEF 55-65% , moderate pulmonary HTN, moderate AS  . Eye surgery Bilateral cataract  . Amputation Left 04/11/2013    Procedure: AMPUTATION DIGIT Left 3rd  toe;  Surgeon: [name], MD;  Location: [facility];  Service: Orthopedics;  Laterality: Left;  Left 3rd toe amputation at [facility]  . Left and right heart catheterization with coronary angiogram [name] 12/24/2012    Procedure: LEFT AND RIGHT HEART CATHETERIZATION WITH CORONARY ANGIOGRAM;  Surgeon: [name], MD;  [facility] CATH LAB;  Service: Cardiovascular;  Laterality: [name];    There were no vitals taken for this visit.  Visit Diagnosis:  Bilateral leg weakness  Abnormality of gait      Subjective Assessment - 09/09/14 1553    Symptoms Pt. states he has been sore since last session and has not been able to do HEP twice a day yet; states he is slowly trying to work through it   Pertinent History R shoulder  sx 2 years ago;  back injury 40 yrs ago; has used cane since about 10 yrs ago    How long can you stand comfortably? about 1 minute   How long can you walk comfortably? 10 minutes   Patient Stated Goals increase leg strength   Currently in Pain? No/denies  soreness but no pain                    [facility] Adult PT Treatment/Exercise - 09/09/14 0001    Lumbar Exercises: Standing   Other Standing Lumbar Exercises 3 # weight used on bil. LE's - standing hip flexion, extension and abduction 10 reps  each; marching in place with 3#weigth on leg 10 reps each   Lumbar Exercises: Seated   Sit to Stand 10 reps   Knee/Hip Exercises: Standing   Functional Squat 10 reps  sanding by hi/low mat table   Ankle Exercises: Aerobic   Stationary Bike scifit level 2.3 x 10"   Ankle Exercises: Standing   Toe Raise 10 reps   Other Standing Ankle Exercises attempted heel raises in standing bil. LE's - pt. unable to do     TherEx: seated bil. Knee flexion with red theraband x 10 reps; LAQ seated bil. LE's with blue theraband x 10 reps each  NeuroRe-ed: single limb stance activities at counter with UE support prn - touching balance bubbles with CGA - straight Ahead and diagonal crossovers Standing on blue foam in corner without UE support with CGA - with head turns to challenge balance sidestepping inside bars 10' x 4 reps; stepping over and back of balance beam with UE support prn with SBA        PT Education - 09/09/14 1638    Education provided Yes   Education Details recommended pt. decrease #of reps with HEP and try to perform 2x/day to focus on endurance/improved activity tolerance   Person(s) Educated Patient   Methods Explanation   Comprehension Verbalized understanding          PT Short Term Goals - 08/26/14 0752    PT SHORT TERM GOAL #1   Title same as LTG's           PT Long Term Goals - 08/26/14 0804    PT LONG TERM GOAL #1   Title Pt. will increase Berg balance test  score to >/= 47/56 to decr. fall risk.   Baseline 41/56            target date 09-24-14   Time 4   Period Weeks   Status New   PT LONG TERM GOAL #2   Title Increase endurance/activity tolerance so pt. able to ambulate 5" with  cane without requiring a seated rest period.   Baseline 09-24-14   Time 4   Period Weeks   Status New   PT LONG TERM GOAL #3   Title Pt. 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 gait velocity to >/= 2.9 ft/sec without cane for incr. gait efficiency.   Baseline 09-24-14   Time 4   Period Weeks   Status New   PT LONG TERM GOAL #5   Title Independent in HEP for balance and strengthening.   Baseline 09-24-14   Time 4   Period Weeks   Status New               Plan - 09/09/14 1640    Clinical Impression Statement Pt. c/o R knee pain with R single limb stance with weight-bearing during LLE standing exercises; pt. able to rest in seated position and able to resume exercise; pt. has decr. endurance and activity tolerance, requiring frequent short seated rest breaks    Pt will benefit from skilled therapeutic intervention in order to improve on the following deficits Difficulty walking;Decreased activity tolerance;Decreased endurance;Impaired sensation;Obesity;Decreased balance;Decreased mobility;Decreased strength   Rehab Potential Good   PT Frequency 2x / week   PT Duration 4 weeks   PT Treatment/Interventions ADLs/Self Care Home Management;Therapeutic activities;Patient/family education;Therapeutic exercise;Gait training;Balance training;Stair training;Neuromuscular re-education;Functional mobility training   PT Next Visit Plan cont with LE strengthening exercises, scifit, and balance training   Consulted and Agree with Plan of Care Patient        Problem List Patient Active Problem List   Diagnosis Date Noted  .  Postural dizziness 05/24/2014  . Paresthesias with subjective weakness 01/30/2014  . Bilateral thigh pain 01/30/2014  . Abdominal wall pain in right upper quadrant 01/22/2014  . Gross hematuria 09/25/2013  . Intertrigo 09/25/2013  . IBS (irritable bowel syndrome) 09/11/2013  . Chronic pain syndrome 08/06/2013  . CAD (coronary artery disease), native coronary artery 12/25/2012  . Paroxysmal atrial fibrillation 12/25/2012  . Moderate aortic stenosis 12/25/2012  . Aortic stenosis 12/24/2012  . Personal history of colonic adenoma 10/30/2012  . Diverticulosis of colon (without mention of hemorrhage) 10/30/2012  . Internal hemorrhoids 10/30/2012  . Change in bowel habits intermittent loose stools 10/30/2012  . Routine health maintenance 06/09/2012  . Hereditary peripheral neuropathy 10/10/2011  . Long term current use of anticoagulant - apixiban 08/16/2010  . DYSPNEA/SHORTNESS OF BREATH 09/08/2009  . HYPERCHOLESTEROLEMIA-PURE 01/13/2009  . Essential hypertension, benign 01/13/2009  . EDEMA 01/13/2009  . GOUT 01/12/2009  . GLAUCOMA 01/12/2009  . VENOUS INSUFFICIENCY 01/12/2009  . COPD 01/12/2009  . VENTRAL HERNIA 01/12/2009  . OSTEOARTHRITIS 01/12/2009  . ARTHRITIS 01/12/2009  . DEGENERATIVE DISC DISEASE 01/12/2009  . CATARACT, HX OF 01/12/2009  . HEART MURMUR, HX OF 01/12/2009  . BENIGN PROSTATIC HYPERTROPHY, HX OF 01/12/2009  . Obesity 01/17/2007  . OBSTRUCTIVE SLEEP APNEA 01/17/2007  . ASTHMA 01/17/2007  . GERD 01/17/2007  . HALLUX VALGUS, ACQUIRED 01/17/2007  . PULMONARY HYPERTENSION, HX OF 01/17/2007    Alda Lea, PT 09/09/2014, 4:45 PM  [facility] [address] [place], [place], [address] Phone: [phone]   Fax:  [phone]

## 2014-09-10 ENCOUNTER — Ambulatory Visit: Payer: Medicare Other | Admitting: *Deleted

## 2014-09-15 ENCOUNTER — Ambulatory Visit: Payer: Medicare Other | Admitting: Physical Therapy

## 2014-09-15 DIAGNOSIS — R269 Unspecified abnormalities of gait and mobility: Secondary | ICD-10-CM

## 2014-09-15 DIAGNOSIS — R29898 Other symptoms and signs involving the musculoskeletal system: Secondary | ICD-10-CM

## 2014-09-16 ENCOUNTER — Encounter: Payer: Self-pay | Admitting: Registered Nurse

## 2014-09-16 ENCOUNTER — Encounter: Payer: Self-pay | Admitting: Physical Therapy

## 2014-09-16 ENCOUNTER — Encounter: Payer: Medicare Other | Attending: Registered Nurse | Admitting: Registered Nurse

## 2014-09-16 VITALS — BP 162/77 | HR 87 | Resp 14

## 2014-09-16 DIAGNOSIS — G609 Hereditary and idiopathic neuropathy, unspecified: Secondary | ICD-10-CM | POA: Diagnosis not present

## 2014-09-16 DIAGNOSIS — M79672 Pain in left foot: Secondary | ICD-10-CM | POA: Diagnosis present

## 2014-09-16 DIAGNOSIS — M1990 Unspecified osteoarthritis, unspecified site: Secondary | ICD-10-CM | POA: Insufficient documentation

## 2014-09-16 DIAGNOSIS — Z5181 Encounter for therapeutic drug level monitoring: Secondary | ICD-10-CM

## 2014-09-16 DIAGNOSIS — M545 Low back pain, unspecified: Secondary | ICD-10-CM

## 2014-09-16 DIAGNOSIS — J449 Chronic obstructive pulmonary disease, unspecified: Secondary | ICD-10-CM | POA: Insufficient documentation

## 2014-09-16 DIAGNOSIS — M109 Gout, unspecified: Secondary | ICD-10-CM | POA: Insufficient documentation

## 2014-09-16 DIAGNOSIS — M4806 Spinal stenosis, lumbar region: Secondary | ICD-10-CM

## 2014-09-16 DIAGNOSIS — I2510 Atherosclerotic heart disease of native coronary artery without angina pectoris: Secondary | ICD-10-CM | POA: Insufficient documentation

## 2014-09-16 DIAGNOSIS — G8929 Other chronic pain: Secondary | ICD-10-CM

## 2014-09-16 DIAGNOSIS — E780 Pure hypercholesterolemia: Secondary | ICD-10-CM | POA: Insufficient documentation

## 2014-09-16 DIAGNOSIS — G608 Other hereditary and idiopathic neuropathies: Secondary | ICD-10-CM

## 2014-09-16 DIAGNOSIS — K219 Gastro-esophageal reflux disease without esophagitis: Secondary | ICD-10-CM | POA: Insufficient documentation

## 2014-09-16 DIAGNOSIS — M48062 Spinal stenosis, lumbar region with neurogenic claudication: Secondary | ICD-10-CM

## 2014-09-16 DIAGNOSIS — Z79899 Other long term (current) drug therapy: Secondary | ICD-10-CM

## 2014-09-16 MED ORDER — OXYCODONE-ACETAMINOPHEN 10-325 MG PO TABS: 1.0000 | ORAL_TABLET | Freq: Three times a day (TID) | ORAL | Status: DC | PRN

## 2014-09-16 NOTE — Progress Notes (Signed)
Subjective:    Patient ID: Tim [name], male    DOB: 04/08/1941, 74 y.o.   MRN: [id number]  HPI: [name] is a 74 year old male who returns for follow up for chronic pain and medication refill. His pain is located in his lower back and bilateral feet.He rates his pain 3. His current exercise regime is attending physical therapy twice a week, also performing stretching exericises daily. He's using his TENS Unit nightly.[name] He arrived to office hypertensive blood pressure re-checked 141/66.  Pain Inventory Average Pain 4 Pain Right Now 3 My pain is intermittent, constant, sharp, dull and aching  In the last 24 hours, has pain interfered with the following? General activity 7 Relation with others 8 Enjoyment of life 5 What TIME of day is your pain at its worst? evening, night  Sleep (in general) Good  Pain is worse with: walking, bending, standing and some activites Pain improves with: rest, therapy/exercise, pacing activities, medication and TENS Relief from Meds: 6  Mobility walk with assistance use a cane how many minutes can you walk? 7 ability to climb steps?  yes do you drive?  yes  Function Do you have any goals in this area?  no  Neuro/Psych weakness numbness tremor trouble walking dizziness  Prior Studies Any changes since last visit?  no  Physicians involved in your care Any changes since last visit?  no   Family History  Problem Relation Age of Onset  . Hypertension Mother   . Coronary artery disease Mother   . Heart attack Mother   . Pulmonary fibrosis Father     asbestosis  . Neuropathy Mother    History   Social History  . Marital Status: Married    Spouse Name: [name]  . Number of Children: 0  . Years of Education: 20   Occupational History  . engineering     retired   Social History Main Topics  . Smoking status: Never Smoker   . Smokeless tobacco: Never Used  . Alcohol Use: No  . Drug Use: No  . Sexual Activity: Not  Currently   Other Topics Concern  . None   Social History Narrative   HSG, [name] - BS, [name] - MS-engineering, 2 years on PhD - [name]. Married - '65 - 98yr/divorced; '76- 3 yrs/divorced; '92 . No children. 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 ACP/Living Will - Yes CPR; long-term Mechanical ventilation as long as he was able to cognate; ok for long term artificial nutrition. Precondition being able to cognate and not to have too much pain.    Past Surgical History  Procedure Laterality Date  . Hemorrhoid surgery    . Vitrectomy    . Intraocular lens insertion Bilateral   . Leg surgery Bilateral   . Knee surgery Right   . Traeculectomy Left     eye  . Shoulder arthroscopy  08/30/2011    Procedure: ARTHROSCOPY SHOULDER;  Surgeon: [name] MD;  [facility] [facility]  Service: Orthopedics;  Laterality: Right;  Right Shoulder Arthroscopy, Debridement, and Decompression  . Tonsillectomy    . Colonoscopy    . Cholecystectomy    . 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  R big toe surg due to osteoarth  . Colonoscopy [name] 10/30/2012    Procedure: COLONOSCOPY;  Surgeon: CGatha Mayer MD;  Location: [facility];  Service: Endoscopy;  Laterality: [name];  . Cardiac catheterization  1997    Non-obstructive disease  . Cardiac catheterization  12/24/2012    mild < 20% LCx, prox 30% RCA; LVEF 55-65% , moderate pulmonary HTN, moderate AS  . Eye surgery Bilateral cataract  . Amputation Left 04/11/2013    Procedure: AMPUTATION DIGIT Left 3rd toe;  Surgeon: [name], MD;  Location: [facility];  Service: Orthopedics;  Laterality: Left;  Left 3rd toe amputation at [facility]  . Left and right heart catheterization with coronary angiogram [name] 12/24/2012    Procedure: LEFT AND RIGHT HEART CATHETERIZATION WITH CORONARY ANGIOGRAM;  Surgeon: [name], MD;  Location: [facility] CATH LAB;  Service: Cardiovascular;  Laterality: [name];   Past Medical History  Diagnosis Date    . HYPERCHOLESTEROLEMIA-PURE   . GOUT   . OSA on CPAP     8 cm H2O  . Essential hypertension, benign   . VENOUS INSUFFICIENCY   . ASTHMA   . COPD   . VENTRAL HERNIA   . OSTEOARTHRITIS   . CATARACT, HX OF   . PULMONARY HYPERTENSION, HX OF   . Hallux valgus (acquired)   . Obesity, unspecified   . Osteoarthrosis, unspecified whether generalized or localized, unspecified site   . Unspecified glaucoma   . Moderate aortic stenosis     Per 12/2012 echo- (VTI 1.08 cm^2, Vmax 1.11 cm ^2), mild LA dilatation  . Paroxysmal atrial fibrillation 2003  . Lumbosacral neuritis   . Unspecified hereditary and idiopathic peripheral neuropathy approx age 39    bilat LE's, ? left arm, too.  Feet became progressively numb + left foot pain intermittently.  . Lumbosacral spondylosis   . CAD (coronary artery disease)     Nonobstructive by 12/2012 cath  . PUD (peptic ulcer disease)   . BPH (benign prostatic hypertrophy)   . HH (hiatus hernia)   . Glaucoma   . GERD (gastroesophageal reflux disease)   . Personal history of colonic adenoma 10/30/2012  . Shortness of breath     with exertion  . Peripheral neuropathy     Heredetary; with chronic neuropathic pain  . Normal memory function 08/2014    Neuropsychological testing ([facility]): no cognitive impairment or sign of neurodegenerative disorder.  Likely has adjustment d/o with mixed anxiety/depressed features and may benefit from low dose SNRI.     There were no vitals taken for this visit.  Opioid Risk Score:   Fall Risk Score: Moderate Fall Risk (6-13 points)  Review of Systems  Musculoskeletal: Positive for gait problem.  Neurological: Positive for dizziness, tremors, weakness and numbness.  All other systems reviewed and are negative.      Objective:   Physical Exam  Constitutional: He is oriented to person, place, and time. He appears well-developed and well-nourished.  Morbid Obesity  HENT:  Head: Normocephalic and  atraumatic.  Neck: Normal range of motion. Neck supple.  Cardiovascular: Normal rate and regular rhythm.   Pulmonary/Chest: Effort normal and breath sounds normal.  Musculoskeletal:  Normal Muscle Bulk and Muscle Testing Reveals: Upper Extremities: Right: Decreased ROM 45 Degrees and  Muscle Strength 5/5 Left: Full ROM and Muscle strength 5/5 Back without spinal or paraspinal tenderness Lower Extremities: Full ROM and Muscle strength 5/5 Arises from chair with ease Narrow Based gait  Neurological: He is alert and oriented to person, place, and time.  Skin: Skin is warm and dry.  Psychiatric: He has a normal mood and affect.  Nursing note and vitals reviewed.  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  Refilled: Percocet 10/325 mg ---use one every 8 hours as needed for pain #70. Continue with Gabapentin.  2. DDD lumbar spine with LBP: Neurology Following. Continue with activity and heat therapy.   20 minutes of face to face patient care time was spent during this visit. All questions were encouraged and answered.   F/U in 1 month

## 2014-09-16 NOTE — Therapy (Signed)
[facility] [address] [place], [place], [address] Phone: [phone]   Fax:  [phone]  Physical Therapy Treatment  Patient Details  Name: Tim [name] MRN: [id number] Date of Birth: 15-Jul-1940 Referring Provider:  Tammi Sou, MD  Encounter Date: 09/15/2014      PT End of Session - 09/16/14 2318    Visit Number 4  G4   Number of Visits 9   Date for PT Re-Evaluation 09/24/14   Authorization Type [employer] Medicare   Authorization Time Period 08-25-14 - 10-24-14   PT Start Time 1607   PT Stop Time 1504   PT Time Calculation (min) 59 min      Past Medical History  Diagnosis Date  . HYPERCHOLESTEROLEMIA-PURE   . GOUT   . OSA on CPAP     8 cm H2O  . Essential hypertension, benign   . VENOUS INSUFFICIENCY   . ASTHMA   . COPD   . VENTRAL HERNIA   . OSTEOARTHRITIS   . CATARACT, HX OF   . PULMONARY HYPERTENSION, HX OF   . Hallux valgus (acquired)   . Obesity, unspecified   . Osteoarthrosis, unspecified whether generalized or localized, unspecified site   . Unspecified glaucoma   . Moderate aortic stenosis     Per 12/2012 echo- (VTI 1.08 cm^2, Vmax 1.11 cm ^2), mild LA dilatation  . Paroxysmal atrial fibrillation 2003  . Lumbosacral neuritis   . Unspecified hereditary and idiopathic peripheral neuropathy approx age 12    bilat LE's, ? left arm, too.  Feet became progressively numb + left foot pain intermittently.  . Lumbosacral spondylosis   . CAD (coronary artery disease)     Nonobstructive by 12/2012 cath  . PUD (peptic ulcer disease)   . BPH (benign prostatic hypertrophy)   . HH (hiatus hernia)   . Glaucoma   . GERD (gastroesophageal reflux disease)   . Personal history of colonic adenoma 10/30/2012  . Shortness of breath     with exertion  . Peripheral neuropathy     Heredetary; with chronic neuropathic pain  . Normal memory function 08/2014    Neuropsychological testing ([facility]): no cognitive impairment or sign of neurodegenerative disorder.  Likely has adjustment d/o with mixed anxiety/depressed features and may benefit from low dose SNRI.      Past Surgical History  Procedure Laterality Date  . Hemorrhoid surgery    . Vitrectomy    . Intraocular lens insertion Bilateral   . Leg surgery Bilateral   . Knee surgery Right   . Traeculectomy Left     eye  . Shoulder arthroscopy  08/30/2011    Procedure: ARTHROSCOPY SHOULDER;  Surgeon: [name], MD;  Location: [facility];  Service: Orthopedics;  Laterality: Right;  Right Shoulder Arthroscopy, Debridement, and Decompression  . Tonsillectomy    . Colonoscopy    . Cholecystectomy    . 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  R big toe surg due to osteoarth  . Colonoscopy [name] 10/30/2012    Procedure: COLONOSCOPY;  Surgeon: [name], MD;  Location: [facility];  Service: Endoscopy;  Laterality: [name];  . Cardiac catheterization  1997    Non-obstructive disease  . Cardiac catheterization  12/24/2012    mild < 20% LCx, prox 30% RCA; LVEF 55-65% , moderate pulmonary HTN, moderate AS  . Eye surgery Bilateral cataract  . Amputation Left 04/11/2013    Procedure: AMPUTATION DIGIT Left 3rd  toe;  Surgeon: [name], MD;  Location: [facility];  Service: Orthopedics;  Laterality: Left;  Left 3rd toe amputation at [facility]  . Left and right heart catheterization with coronary angiogram [name] 12/24/2012    Procedure: LEFT AND RIGHT HEART CATHETERIZATION WITH CORONARY ANGIOGRAM;  Surgeon: [name], MD;  Location: [facility] CATH LAB;  Service: Cardiovascular;  Laterality: [name];    There were no vitals filed for this visit.  Visit Diagnosis:  Bilateral leg weakness  Abnormality of gait      Subjective Assessment - 09/16/14 2314    Symptoms pt. states he is very weak today - but is going to try to do all exercises; has not done HEP 2x/day yet   Pertinent History R shoulder sx 2 years ago;  back injury 40 yrs  ago; has used cane since about 10 yrs ago    Patient Stated Goals increase leg strength   Currently in Pain? No/denies                       [facility] Adult PT Treatment/Exercise - 09/16/14 0001    Lumbar Exercises: Standing   Other Standing Lumbar Exercises 3 # weight used on bil. LE's - standing hip flexion, extension and abduction 10 reps  each; marching in place with 3#weigth on leg 10 reps each   Lumbar Exercises: Seated   Sit to Stand 10 reps   Knee/Hip Exercises: Standing   Heel Raises 10 reps   Knee Flexion Both;10 reps  green theraband - seated position   Functional Squat 10 reps  sanding by hi/low mat table   Ankle Exercises: Aerobic   Stationary Bike scifit level 2.4 x 20 "   Ankle Exercises: Standing   Other Standing Ankle Exercises attempted heel raises in standing bil. LE's - pt. unable to do     TherEx; LAQ's with green theraband x 10 reps each x 3 sec hold; marching with 3# weight on each leg with UE support  NeuroRe-ed: stepping over and back of balance beam with UE support x 10 reps each leg;  Alternate tap ups to 4" step X 10 reps each with UE support; sidestepping inside bars with UE support prn; standing unsupported inside bars  with CGA             PT Short Term Goals - 08/26/14 0752    PT SHORT TERM GOAL #1   Title same as LTG's           PT Long Term Goals - 08/26/14 0804    PT LONG TERM GOAL #1   Title Pt. will increase Berg balance test score to >/= 47/56 to decr. fall risk.   Baseline 41/56            target date 09-24-14   Time 4   Period Weeks   Status New   PT LONG TERM GOAL #2   Title Increase endurance/activity tolerance so pt. able to ambulate 5" with cane without requiring a seated rest period.   Baseline 09-24-14   Time 4   Period Weeks   Status New   PT LONG TERM GOAL #3   Title Pt. 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 gait velocity to >/= 2.9 ft/sec without cane for  incr. gait efficiency.   Baseline 09-24-14   Time 4   Period Weeks   Status New   PT LONG TERM GOAL #5   Title Independent in HEP for balance and strengthening.   Baseline 09-24-14   Time 4   Period Weeks   Status New               Plan - 09/16/14 2319    Clinical Impression Statement Pt. c/o R knee pain with R single limb stance activities - able to rest in seated position and cont. with exercise; activity tolerance is increasing   Pt will benefit from skilled therapeutic intervention in order to improve on the following deficits Difficulty walking;Decreased activity tolerance;Decreased endurance;Impaired sensation;Obesity;Decreased balance;Decreased mobility;Decreased strength   Rehab Potential Good   PT Frequency 2x / week   PT Duration 4 weeks   PT Treatment/Interventions ADLs/Self Care Home Management;Therapeutic activities;Patient/family education;Therapeutic exercise;Gait training;Balance training;Stair training;Neuromuscular re-education;Functional mobility training   PT Next Visit Plan cont with LE strengthening exercises, scifit, and balance training   PT Home Exercise Plan see above   Consulted and Agree with Plan of Care Patient        Problem List Patient Active Problem List   Diagnosis Date Noted  . Postural dizziness 05/24/2014  . Paresthesias with subjective weakness 01/30/2014  . Bilateral thigh pain 01/30/2014  . Abdominal wall pain in right upper quadrant 01/22/2014  . Gross hematuria 09/25/2013  . Intertrigo 09/25/2013  . IBS (irritable bowel syndrome) 09/11/2013  . Chronic pain syndrome 08/06/2013  . CAD (coronary artery disease), native coronary artery 12/25/2012  . Paroxysmal atrial fibrillation 12/25/2012  . Moderate aortic stenosis 12/25/2012  . Aortic stenosis 12/24/2012  . Personal history of colonic adenoma 10/30/2012  . Diverticulosis of  colon (without mention of hemorrhage) 10/30/2012  . Internal hemorrhoids 10/30/2012  . Change in bowel habits intermittent loose stools 10/30/2012  . Routine health maintenance 06/09/2012  . Hereditary peripheral neuropathy 10/10/2011  . Long term current use of anticoagulant - apixiban 08/16/2010  . DYSPNEA/SHORTNESS OF BREATH 09/08/2009  . HYPERCHOLESTEROLEMIA-PURE 01/13/2009  . Essential hypertension, benign 01/13/2009  . EDEMA 01/13/2009  . GOUT 01/12/2009  . GLAUCOMA 01/12/2009  . VENOUS INSUFFICIENCY 01/12/2009  . COPD 01/12/2009  . VENTRAL HERNIA 01/12/2009  . OSTEOARTHRITIS 01/12/2009  . ARTHRITIS 01/12/2009  . DEGENERATIVE DISC DISEASE 01/12/2009  . CATARACT, HX OF 01/12/2009  . HEART MURMUR, HX OF 01/12/2009  . BENIGN PROSTATIC HYPERTROPHY, HX OF 01/12/2009  . Obesity 01/17/2007  . OBSTRUCTIVE SLEEP APNEA 01/17/2007  . ASTHMA 01/17/2007  . GERD 01/17/2007  . HALLUX VALGUS, ACQUIRED 01/17/2007  . PULMONARY HYPERTENSION, HX OF 01/17/2007    Alda Lea, PT 09/16/2014, 11:23 PM  [facility] [address] [place], [place], [address] Phone: [phone]   Fax:  [phone]

## 2014-09-17 ENCOUNTER — Telehealth: Payer: Self-pay | Admitting: Family Medicine

## 2014-09-17 ENCOUNTER — Ambulatory Visit: Payer: Medicare Other | Admitting: Family Medicine

## 2014-09-17 NOTE — Telephone Encounter (Signed)
Patient called in to cancel his appointment here today due to an infected toe. He is very concerned as that he has had to have toes amputated in past due to infection. Patient asked me to call [facility] to make an emergency appointment for him. He will be going at 11:00 today to see Dr. Jacqulyn Bath. No further action is needed by our office since no referral was required.

## 2014-09-17 NOTE — Telephone Encounter (Signed)
Thanks, good job.

## 2014-09-17 NOTE — Telephone Encounter (Signed)
FYI

## 2014-09-22 ENCOUNTER — Ambulatory Visit: Payer: Medicare Other | Admitting: Physical Therapy

## 2014-09-22 DIAGNOSIS — R29898 Other symptoms and signs involving the musculoskeletal system: Secondary | ICD-10-CM

## 2014-09-22 DIAGNOSIS — R269 Unspecified abnormalities of gait and mobility: Secondary | ICD-10-CM | POA: Diagnosis not present

## 2014-09-23 ENCOUNTER — Encounter: Payer: Self-pay | Admitting: Physical Therapy

## 2014-09-23 NOTE — Therapy (Signed)
[facility] [address] [place], [place], [address] Phone: [phone]   Fax:  [phone]  Physical Therapy Treatment  Patient Details  Name: Tim [name] MRN: [id number] Date of Birth: 1940/07/13 Referring Provider:  Tammi Sou, MD  Encounter Date: 09/22/2014      PT End of Session - 09/23/14 1352    Visit Number 5  G5   Number of Visits 9   Date for PT Re-Evaluation 09/24/14   Authorization Type U[employer] Medicare   Authorization Time Period 08-25-14 - 10-24-14   PT Start Time 1401   PT Stop Time 1446   PT Time Calculation (min) 45 min      Past Medical History  Diagnosis Date  . HYPERCHOLESTEROLEMIA-PURE   . GOUT   . OSA on CPAP     8 cm H2O  . Essential hypertension, benign   . VENOUS INSUFFICIENCY   . ASTHMA   . COPD   . VENTRAL HERNIA   . OSTEOARTHRITIS   . CATARACT, HX OF   . PULMONARY HYPERTENSION, HX OF   . Hallux valgus (acquired)   . Obesity, unspecified   . Osteoarthrosis, unspecified whether generalized or localized, unspecified site   . Unspecified glaucoma   . Moderate aortic stenosis     Per 12/2012 echo- (VTI 1.08 cm^2, Vmax 1.11 cm ^2), mild LA dilatation  . Paroxysmal atrial fibrillation 2003  . Lumbosacral neuritis   . Unspecified hereditary and idiopathic peripheral neuropathy approx age 66    bilat LE's, ? left arm, too.  Feet became progressively numb + left foot pain intermittently.  . Lumbosacral spondylosis   . CAD (coronary artery disease)     Nonobstructive by 12/2012 cath  . PUD (peptic ulcer disease)   . BPH (benign prostatic hypertrophy)   . HH (hiatus hernia)   . Glaucoma   . GERD (gastroesophageal reflux disease)   . Personal history of colonic adenoma 10/30/2012  . Shortness of breath     with exertion  . Peripheral neuropathy     Heredetary; with chronic neuropathic pain  . Normal memory function 08/2014    Neuropsychological testing ([facility]): no cognitive impairment or sign of neurodegenerative disorder.  Likely has adjustment d/o with mixed anxiety/depressed features and may benefit from low dose SNRI.      Past Surgical History  Procedure Laterality Date  . Hemorrhoid surgery    . Vitrectomy    . Intraocular lens insertion Bilateral   . Leg surgery Bilateral   . Knee surgery Right   . Traeculectomy Left     eye  . Shoulder arthroscopy  08/30/2011    Procedure: ARTHROSCOPY SHOULDER;  Surgeon: [name], MD;  Location: [facility];  Service: Orthopedics;  Laterality: Right;  Right Shoulder Arthroscopy, Debridement, and Decompression  . Tonsillectomy    . Colonoscopy    . Cholecystectomy    . 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  R big toe surg due to osteoarth  . Colonoscopy [name] 10/30/2012    Procedure: COLONOSCOPY;  Surgeon: [name], MD;  Location: [facility];  Service: Endoscopy;  Laterality: [name];  . Cardiac catheterization  1997    Non-obstructive disease  . Cardiac catheterization  12/24/2012    mild < 20% LCx, prox 30% RCA; LVEF 55-65% , moderate pulmonary HTN, moderate AS  . Eye surgery Bilateral cataract  . Amputation Left 04/11/2013    Procedure: AMPUTATION DIGIT Left 3rd  toe;  Surgeon: [name], MD;  Location: [facility];  Service: Orthopedics;  Laterality: Left;  Left 3rd toe amputation at [facility]  . Left and right heart catheterization with coronary angiogram [name] 12/24/2012    Procedure: LEFT AND RIGHT HEART CATHETERIZATION WITH CORONARY ANGIOGRAM;  Surgeon: [name], MD;  Location: [facility] CATH LAB;  Service: Cardiovascular;  Laterality: [name];    There were no vitals filed for this visit.  Visit Diagnosis:  Bilateral leg weakness  Abnormality of gait      Subjective Assessment - 09/23/14 1345    Symptoms Pt. reports he is having pain in right hip (thinks it is bursitis as he has had this before); wants to modify exercises and decrease frequency until this pain resolves    Pertinent History R shoulder sx 2 years ago;  back injury 40 yrs ago; has used cane since about 10 yrs ago    How long can you stand comfortably? about 1 minute   How long can you walk comfortably? 10 minutes   Patient Stated Goals increase leg strength   Currently in Pain? Yes   Pain Score 5    Pain Location Hip   Pain Orientation Right   Pain Descriptors / Indicators Discomfort;Sharp;Throbbing;Tender   Pain Type Acute pain   Pain Onset In the past 7 days   Pain Frequency Intermittent                       [facility] Adult PT Treatment/Exercise - 09/23/14 0001    Lumbar Exercises: Aerobic   Stationary Bike Nustep level 6 x 5" with UE's and LE's   Lumbar Exercises: Supine   Bridge 10 reps   Straight Leg Raise 10 reps  RLE and LLE   Other Supine Lumbar Exercises pelvic tilt - 5 sec hold x 10 reps   Other Supine Lumbar Exercises sidelying hip abduction 10 reps no weight   Knee/Hip Exercises: Seated   Long Arc Quad AROM;Both;1 set;10 reps   Other Seated Knee Exercises seated marching with 3# weight on each leg   Knee/Hip Exercises: Supine   Bridges 10 reps   Ankle Exercises: Seated   Heel Raises 10 reps  bil. LE's wtih 3#weight   Toe Raise 10 reps  bil. LE's with 3# weight                  PT Short Term Goals - 08/26/14 0752    PT SHORT TERM GOAL #1   Title same as LTG's           PT Long Term Goals - 08/26/14 0804    PT LONG TERM GOAL #1   Title Pt. will increase Berg balance test score to >/= 47/56 to decr. fall risk.   Baseline 41/56            target date 09-24-14   Time 4   Period Weeks   Status New   PT LONG TERM GOAL #2   Title Increase endurance/activity tolerance so pt. able to ambulate 5" with cane without requiring a seated rest period.   Baseline 09-24-14   Time 4   Period Weeks   Status New   PT LONG TERM GOAL #3   Title Pt. 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 gait velocity to >/= 2.9 ft/sec without cane for incr. gait efficiency.   Baseline 09-24-14   Time 4   Period Weeks   Status New   PT LONG TERM GOAL #5   Title Independent in HEP for balance and strengthening.   Baseline 09-24-14   Time 4   Period Weeks   Status New               Plan - 09/23/14 1353    Clinical Impression Statement Pt. requested not to perform standing exercises today due to right hip pain which he thinks is bursitis; tolerated exercises in supine well   Pt will benefit from skilled therapeutic intervention in order to improve on the following deficits Difficulty walking;Decreased activity tolerance;Decreased endurance;Impaired sensation;Obesity;Decreased balance;Decreased mobility;Decreased strength   Rehab Potential Good   PT Frequency 2x / week   PT Duration 4 weeks   PT Treatment/Interventions ADLs/Self Care Home Management;Therapeutic activities;Patient/family education;Therapeutic exercise;Gait training;Balance training;Stair training;Neuromuscular re-education;Functional mobility training   PT Next Visit Plan cont with LE strengthening exercises, scifit, and balance training- depending on c/o hip pain which pt. thinks is bursitis   PT Home Exercise Plan see above   Consulted and Agree with Plan of Care Patient        Problem List Patient Active Problem List   Diagnosis Date Noted  . Postural dizziness 05/24/2014  . Paresthesias with subjective weakness 01/30/2014  . Bilateral thigh pain 01/30/2014  . Abdominal wall pain in right upper quadrant 01/22/2014  . Gross hematuria 09/25/2013  . Intertrigo 09/25/2013  . IBS (irritable bowel syndrome) 09/11/2013  . Chronic pain syndrome 08/06/2013  . CAD (coronary artery disease), native coronary artery 12/25/2012  . Paroxysmal atrial fibrillation 12/25/2012  . Moderate aortic stenosis 12/25/2012  . Aortic stenosis 12/24/2012  .  Personal history of colonic adenoma 10/30/2012  . Diverticulosis of colon (without mention of hemorrhage) 10/30/2012  . Internal hemorrhoids 10/30/2012  . Change in bowel habits intermittent loose stools 10/30/2012  . Routine health maintenance 06/09/2012  . Hereditary peripheral neuropathy 10/10/2011  . Long term current use of anticoagulant - apixiban 08/16/2010  . DYSPNEA/SHORTNESS OF BREATH 09/08/2009  . HYPERCHOLESTEROLEMIA-PURE 01/13/2009  . Essential hypertension, benign 01/13/2009  . EDEMA 01/13/2009  . GOUT 01/12/2009  . GLAUCOMA 01/12/2009  . VENOUS INSUFFICIENCY 01/12/2009  . COPD 01/12/2009  . VENTRAL HERNIA 01/12/2009  . OSTEOARTHRITIS 01/12/2009  . ARTHRITIS 01/12/2009  . DEGENERATIVE DISC DISEASE 01/12/2009  . CATARACT, HX OF 01/12/2009  . HEART MURMUR, HX OF 01/12/2009  . BENIGN PROSTATIC HYPERTROPHY, HX OF 01/12/2009  . Obesity 01/17/2007  . OBSTRUCTIVE SLEEP APNEA 01/17/2007  . ASTHMA 01/17/2007  . GERD 01/17/2007  . HALLUX VALGUS, ACQUIRED 01/17/2007  . PULMONARY HYPERTENSION, HX OF 01/17/2007    Alda Lea, PT 09/23/2014, 1:57 PM  [facility] [address] [place], [place], [address] Phone: [phone]   Fax:  [phone]

## 2014-09-24 ENCOUNTER — Ambulatory Visit: Payer: Medicare Other | Admitting: Physical Therapy

## 2014-09-29 ENCOUNTER — Ambulatory Visit: Payer: Medicare Other | Admitting: Physical Therapy

## 2014-10-01 ENCOUNTER — Ambulatory Visit: Payer: Medicare Other | Admitting: *Deleted

## 2014-10-06 ENCOUNTER — Ambulatory Visit: Payer: Medicare Other | Attending: Family Medicine | Admitting: Physical Therapy

## 2014-10-06 DIAGNOSIS — Z7409 Other reduced mobility: Secondary | ICD-10-CM

## 2014-10-06 DIAGNOSIS — R269 Unspecified abnormalities of gait and mobility: Secondary | ICD-10-CM | POA: Diagnosis not present

## 2014-10-06 DIAGNOSIS — G629 Polyneuropathy, unspecified: Secondary | ICD-10-CM | POA: Diagnosis not present

## 2014-10-06 DIAGNOSIS — R531 Weakness: Secondary | ICD-10-CM

## 2014-10-07 ENCOUNTER — Encounter: Payer: Self-pay | Admitting: Physical Therapy

## 2014-10-07 ENCOUNTER — Encounter: Payer: Self-pay | Admitting: Family Medicine

## 2014-10-07 ENCOUNTER — Ambulatory Visit (INDEPENDENT_AMBULATORY_CARE_PROVIDER_SITE_OTHER): Payer: Medicare Other | Admitting: Family Medicine

## 2014-10-07 VITALS — BP 120/74 | HR 65 | Temp 98.7°F | Ht 73.0 in | Wt 360.0 lb

## 2014-10-07 DIAGNOSIS — E662 Morbid (severe) obesity with alveolar hypoventilation: Secondary | ICD-10-CM

## 2014-10-07 DIAGNOSIS — Z125 Encounter for screening for malignant neoplasm of prostate: Secondary | ICD-10-CM | POA: Diagnosis not present

## 2014-10-07 DIAGNOSIS — F329 Major depressive disorder, single episode, unspecified: Secondary | ICD-10-CM

## 2014-10-07 DIAGNOSIS — R0609 Other forms of dyspnea: Secondary | ICD-10-CM | POA: Diagnosis not present

## 2014-10-07 DIAGNOSIS — Z9889 Other specified postprocedural states: Secondary | ICD-10-CM

## 2014-10-07 DIAGNOSIS — I10 Essential (primary) hypertension: Secondary | ICD-10-CM

## 2014-10-07 DIAGNOSIS — F418 Other specified anxiety disorders: Secondary | ICD-10-CM | POA: Diagnosis not present

## 2014-10-07 DIAGNOSIS — F419 Anxiety disorder, unspecified: Secondary | ICD-10-CM

## 2014-10-07 DIAGNOSIS — E782 Mixed hyperlipidemia: Secondary | ICD-10-CM

## 2014-10-07 DIAGNOSIS — F32A Depression, unspecified: Secondary | ICD-10-CM

## 2014-10-07 DIAGNOSIS — R0600 Dyspnea, unspecified: Secondary | ICD-10-CM

## 2014-10-07 LAB — COMPREHENSIVE METABOLIC PANEL
ALK PHOS: 66 U/L (ref 39–117)
ALT: 13 U/L (ref 0–53)
AST: 15 U/L (ref 0–37)
Albumin: 3.9 g/dL (ref 3.5–5.2)
BILIRUBIN TOTAL: 0.5 mg/dL (ref 0.2–1.2)
BUN: 18 mg/dL (ref 6–23)
CALCIUM: 9.1 mg/dL (ref 8.4–10.5)
CO2: 31 mEq/L (ref 19–32)
Chloride: 104 mEq/L (ref 96–112)
Creatinine, Ser: 0.73 mg/dL (ref 0.40–1.50)
GFR: 111.37 mL/min (ref >60.00)
Glucose, Bld: 121 mg/dL — ABNORMAL HIGH (ref 70–99)
Potassium: 4.5 mEq/L (ref 3.5–5.1)
Sodium: 141 mEq/L (ref 135–145)
TOTAL PROTEIN: 6.6 g/dL (ref 6.0–8.3)

## 2014-10-07 LAB — PSA, MEDICARE: PSA: 0.33 ng/ml (ref 0.10–4.00)

## 2014-10-07 LAB — LIPID PANEL
CHOLESTEROL: 115 mg/dL (ref 0–200)
HDL: 61.1 mg/dL (ref >39.00)
LDL Cholesterol: 39 mg/dL (ref 0–99)
NonHDL: 53.9
TRIGLYCERIDES: 75 mg/dL (ref 0.0–149.0)
Total CHOL/HDL Ratio: 2
VLDL: 15 mg/dL (ref 0.0–40.0)

## 2014-10-07 MED ORDER — DULOXETINE HCL 30 MG PO CPEP: 30.0000 mg | ORAL_CAPSULE | Freq: Every day | ORAL | Status: DC

## 2014-10-07 MED ORDER — DULOXETINE HCL 30 MG PO CPEP: ORAL_CAPSULE | ORAL | Status: DC

## 2014-10-07 NOTE — Therapy (Signed)
[facility] [address] [place], [place], [address] Phone: [phone]   Fax:  [phone]  Physical Therapy Treatment  Patient Details  Name: Tim [name] MRN: [id number] Date of Birth: [birth year] Referring Provider:  Tammi Sou, MD  Encounter Date: 10/06/2014      PT End of Session - 10/07/14 1141    Visit Number 6   Number of Visits 9   Date for PT Re-Evaluation 09/24/14   Authorization Type UHC Medicare   Authorization Time Period 08-25-14 - 10-24-14   PT Start Time 1406   PT Stop Time 1450   PT Time Calculation (min) 44 min   Activity Tolerance Patient limited by fatigue   Behavior During Therapy [facility] for tasks assessed/performed      Past Medical History  Diagnosis Date  . HYPERCHOLESTEROLEMIA-PURE   . GOUT   . OSA on CPAP     8 cm H2O  . Essential hypertension, benign   . VENOUS INSUFFICIENCY   . ASTHMA   . COPD   . VENTRAL HERNIA   . OSTEOARTHRITIS   . CATARACT, HX OF   . PULMONARY HYPERTENSION, HX OF   . Hallux valgus (acquired)   . Obesity, unspecified   . Osteoarthrosis, unspecified whether generalized or localized, unspecified site   . Unspecified glaucoma   . Moderate aortic stenosis     Per 12/2012 echo- (VTI 1.08 cm^2, Vmax 1.11 cm ^2), mild LA dilatation  . Paroxysmal atrial fibrillation 2003  . Lumbosacral neuritis   . Unspecified hereditary and idiopathic peripheral neuropathy approx age 65    bilat LE's, ? left arm, too.  Feet became progressively numb + left foot pain intermittently.  . Lumbosacral spondylosis   . CAD (coronary artery disease)     Nonobstructive by 12/2012 cath  . PUD (peptic ulcer disease)   . BPH (benign prostatic hypertrophy)   . HH (hiatus hernia)   . Glaucoma   . GERD (gastroesophageal reflux disease)   . Personal history of colonic adenoma 10/30/2012  . Shortness of breath     with exertion  . Peripheral neuropathy     Heredetary; with chronic  neuropathic pain  . Normal memory function 08/2014    Neuropsychological testing ([facility]): no cognitive impairment or sign of neurodegenerative disorder.  Likely has adjustment d/o with mixed anxiety/depressed features and may benefit from low dose SNRI.      Past Surgical History  Procedure Laterality Date  . Hemorrhoid surgery    . Vitrectomy    . Intraocular lens insertion Bilateral   . Leg surgery Bilateral   . Knee surgery Right   . Traeculectomy Left     eye  . Shoulder arthroscopy  08/30/2011    Procedure: ARTHROSCOPY SHOULDER;  Surgeon: [name], MD;  Location: [facility];  Service: Orthopedics;  Laterality: Right;  Right Shoulder Arthroscopy, Debridement, and Decompression  . Tonsillectomy    . Colonoscopy    . Cholecystectomy    . 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  R big toe surg due to osteoarth  . Colonoscopy [name] 10/30/2012    Procedure: COLONOSCOPY;  Surgeon: [name], MD;  Location: [facility];  Service: Endoscopy;  Laterality: [name];  . Cardiac catheterization  1997    Non-obstructive disease  . Cardiac catheterization  12/24/2012    mild < 20% LCx, prox 30% RCA; LVEF 55-65% , moderate pulmonary HTN, moderate AS  . Eye surgery  Bilateral cataract  . Amputation Left 04/11/2013    Procedure: AMPUTATION DIGIT Left 3rd toe;  Surgeon: [name], MD;  Location: [facility];  Service: Orthopedics;  Laterality: Left;  Left 3rd toe amputation at [facility]  . Left and right heart catheterization with coronary angiogram [name] 12/24/2012    Procedure: LEFT AND RIGHT HEART CATHETERIZATION WITH CORONARY ANGIOGRAM;  Surgeon: [name], MD;  Location: [name] CATH LAB;  Service: Cardiovascular;  Laterality: [name];    There were no vitals filed for this visit.  Visit Diagnosis:  Decreased strength, endurance, and mobility      Subjective Assessment - 10/07/14 1138    Subjective Pt reports pain in hip improved overall along with back pain.  Feels he is getting  stronger.   Pertinent History R shoulder sx 2 years ago;  back injury 40 yrs ago; has used cane since about 10 yrs ago    How long can you stand comfortably? about 1 minute   How long can you walk comfortably? 10 minutes   Patient Stated Goals increase leg strength   Currently in Pain? No/denies                       [facility] Adult PT Treatment/Exercise - 10/07/14 1139    Ambulation/Gait   Ambulation/Gait Yes   Ambulation/Gait Assistance 6: Modified independent (Device/Increase time)   Ambulation Distance (Feet) --  3 1/2 minutes   Assistive device None   Gait Pattern Wide base of support   Ambulation Surface Level;Indoor   Gait velocity 3.59 ft/sec no device   Berg Balance Test   Sit to Stand Able to stand without using hands and stabilize independently   Standing Unsupported Able to stand safely 2 minutes   Sitting with Back Unsupported but Feet Supported on Floor or Stool Able to sit safely and securely 2 minutes   Stand to Sit Sits safely with minimal use of hands   Transfers Able to transfer safely, minor use of hands   Standing Unsupported with Eyes Closed Able to stand 10 seconds safely   Standing Ubsupported with Feet Together Able to place feet together independently and stand 1 minute safely   From Standing, Reach Forward with Outstretched Arm Can reach confidently >25 cm (10")   From Standing Position, Pick up Object from Floor Able to pick up shoe safely and easily   From Standing Position, Turn to Look Behind Over each Shoulder Turn sideways only but maintains balance   Turn 360 Degrees Able to turn 360 degrees safely in 4 seconds or less   Standing Unsupported, Alternately Place Feet on Step/Stool Able to complete >2 steps/needs minimal assist   Standing Unsupported, One Foot in Front Able to plae foot ahead of the other independently and hold 30 seconds   Standing on One Leg Tries to lift leg/unable to hold 3 seconds but remains standing independently    Total Score 47   Lumbar Exercises: Supine   Bridge 10 reps;5 seconds   Bridge Limitations c/o inital pain and cramping but resolves after 3 reps   Other Supine Lumbar Exercises pelvic tilt - 5 sec hold x 10 reps                PT Education - 10/07/14 1140    Education provided Yes   Education Details Progress toward goals   Person(s) Educated Patient   Methods Explanation   Comprehension Verbalized understanding  PT Short Term Goals - 08/26/14 0752    PT SHORT TERM GOAL #1   Title same as LTG's           PT Long Term Goals - 08/26/14 0804    PT LONG TERM GOAL #1   Title Pt. will increase Berg balance test score to >/= 47/56 to decr. fall risk.   Baseline 41/56            target date 09-24-14   Time 4   Period Weeks   Status New   PT LONG TERM GOAL #2   Title Increase endurance/activity tolerance so pt. able to ambulate 5" with cane without requiring a seated rest period.   Baseline 09-24-14   Time 4   Period Weeks   Status New   PT LONG TERM GOAL #3   Title Pt. 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 gait velocity to >/= 2.9 ft/sec without cane for incr. gait efficiency.   Baseline 09-24-14   Time 4   Period Weeks   Status New   PT LONG TERM GOAL #5   Title Independent in HEP for balance and strengthening.   Baseline 09-24-14   Time 4   Period Weeks   Status New               Plan - 10/07/14 1141    Clinical Impression Statement Pt met goal #1,4,5.  Goal #2 unmet secondary to pain in hip.  Goal #3 n/a.  Per Guido Sander, PT, plan to renew pt for 4 more weeks of PT.   Pt will benefit from skilled therapeutic intervention in order to improve on the following deficits Difficulty walking;Decreased activity tolerance;Decreased endurance;Impaired sensation;Obesity;Decreased balance;Decreased  mobility;Decreased strength   Rehab Potential Good   PT Frequency 2x / week   PT Duration 4 weeks   PT Treatment/Interventions ADLs/Self Care Home Management;Therapeutic activities;Patient/family education;Therapeutic exercise;Gait training;Balance training;Stair training;Neuromuscular re-education;Functional mobility training   PT Next Visit Plan Renewal per [name], PT.  Continue strengthening, endurance and balance.   Consulted and Agree with Plan of Care Patient        Problem List Patient Active Problem List   Diagnosis Date Noted  . Postural dizziness 05/24/2014  . Paresthesias with subjective weakness 01/30/2014  . Bilateral thigh pain 01/30/2014  . Abdominal wall pain in right upper quadrant 01/22/2014  . Gross hematuria 09/25/2013  . Intertrigo 09/25/2013  . IBS (irritable bowel syndrome) 09/11/2013  . Chronic pain syndrome 08/06/2013  . CAD (coronary artery disease), native coronary artery 12/25/2012  . Paroxysmal atrial fibrillation 12/25/2012  . Moderate aortic stenosis 12/25/2012  . Aortic stenosis 12/24/2012  . Personal history of colonic adenoma 10/30/2012  . Diverticulosis of colon (without mention of hemorrhage) 10/30/2012  . Internal hemorrhoids 10/30/2012  . Change in bowel habits intermittent loose stools 10/30/2012  . Routine health maintenance 06/09/2012  . Hereditary peripheral neuropathy 10/10/2011  . Long term current use of anticoagulant - apixiban 08/16/2010  . Dyspnea on exertion 09/08/2009  . HYPERCHOLESTEROLEMIA-PURE 01/13/2009  . Essential hypertension, benign 01/13/2009  . EDEMA 01/13/2009  . GOUT 01/12/2009  . GLAUCOMA 01/12/2009  . VENOUS INSUFFICIENCY 01/12/2009  . COPD (chronic obstructive pulmonary disease) 01/12/2009  . VENTRAL HERNIA 01/12/2009  . OSTEOARTHRITIS 01/12/2009  . ARTHRITIS 01/12/2009  .  DEGENERATIVE DISC DISEASE 01/12/2009  . CATARACT, HX OF 01/12/2009  . HEART MURMUR, HX OF 01/12/2009  . BENIGN PROSTATIC  HYPERTROPHY, HX OF 01/12/2009  . Obesity 01/17/2007  . OBSTRUCTIVE SLEEP APNEA 01/17/2007  . ASTHMA 01/17/2007  . GERD 01/17/2007  . HALLUX VALGUS, ACQUIRED 01/17/2007  . PULMONARY HYPERTENSION, HX OF 01/17/2007    Narda Bonds 10/07/2014, 11:45 AM  [facility] [address] [place], [place], [address] Phone: [phone]   Fax:  [place], [place] 10/07/2014 11:45 AM Phone: [phone] Fax: [phone]

## 2014-10-07 NOTE — Progress Notes (Signed)
Pre visit review using our clinic review tool, if applicable. No additional management support is needed unless otherwise documented below in the visit note. 

## 2014-10-07 NOTE — Progress Notes (Signed)
OFFICE VISIT  10/07/2014   CC: Chronic probs f/u  HPI:    Patient is a 74 y.o. Caucasian male who presents for 4 mo f/u chronic DOE (multifactorial), long hx of orthostatic dizziness, long hx of bilat LE generalized weakness (multifactorial), and obesity hypoventilation syndrome.  Says breathing is stable, not having much in the way of orthostatic dizziness at all lately, and his LE generalized weakness and balance are improving with neuro PT.  Most recently he got neuro eval mainly for some cognitive/memory complaints and they suggested he get formal neuropsych testing. This was done and their impression was that he had no primary neurological memory/cognitive problem but his sx's were likely secondary to chronic anxiety and depression, a low dose of SNRI was suggested. I talked to him about this today and he is agreeable to trial of med.  He is happy to inform me that he has been using a TENS unit on low back and it has GREATLY reduced his left little toe chronic pain.  He tried cutting back on pain meds but was unable to, so he is happy with current combo of meds + TENS unit.  He still sees pain mgmt specialist. TENS unit also helping him sleep much better (12-14 hours at a time).  He continues with PT to build strength and balance in LE's and this is helping his back as well.  He is fasting for labs today, also wants PSA/DRE for prostate cancer screening. He also says he got some tendons cut on left foot toes yesterday by his orthopedist for hammertoes that were starting to get ulcers on dorsal surface again.  He was given a rx by ortho for specialized (wide/deep) shoes and he says I may have to complete some paperwork for this.  Past Medical History  Diagnosis Date  . HYPERCHOLESTEROLEMIA-PURE   . GOUT   . OSA on CPAP     8 cm H2O  . Essential hypertension, benign   . VENOUS INSUFFICIENCY   . ASTHMA   . COPD   . VENTRAL HERNIA   . OSTEOARTHRITIS   . CATARACT, HX OF   .  PULMONARY HYPERTENSION, HX OF   . Hallux valgus (acquired)   . Obesity, unspecified   . Osteoarthrosis, unspecified whether generalized or localized, unspecified site   . Unspecified glaucoma   . Moderate aortic stenosis     Per 12/2012 echo- (VTI 1.08 cm^2, Vmax 1.11 cm ^2), mild LA dilatation  . Paroxysmal atrial fibrillation 2003  . Lumbosacral neuritis   . Unspecified hereditary and idiopathic peripheral neuropathy approx age 67    bilat LE's, ? left arm, too.  Feet became progressively numb + left foot pain intermittently.  . Lumbosacral spondylosis   . CAD (coronary artery disease)     Nonobstructive by 12/2012 cath  . PUD (peptic ulcer disease)   . BPH (benign prostatic hypertrophy)   . HH (hiatus hernia)   . Glaucoma   . GERD (gastroesophageal reflux disease)   . Personal history of colonic adenoma 10/30/2012  . Shortness of breath     with exertion  . Peripheral neuropathy     Heredetary; with chronic neuropathic pain  . Normal memory function 08/2014    Neuropsychological testing ([facility]): no cognitive impairment or sign of neurodegenerative disorder.  Likely has adjustment d/o with mixed anxiety/depressed features and may benefit from low dose SNRI.      Past Surgical History  Procedure Laterality Date  . Hemorrhoid surgery    .  Vitrectomy    . Intraocular lens insertion Bilateral   . Leg surgery Bilateral   . Knee surgery Right   . Traeculectomy Left     eye  . Shoulder arthroscopy  08/30/2011    Procedure: ARTHROSCOPY SHOULDER;  Surgeon: [name], MD;  Location: [place];  Service: Orthopedics;  Laterality: Right;  Right Shoulder Arthroscopy, Debridement, and Decompression  . Tonsillectomy    . Colonoscopy    . Cholecystectomy    . 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  R big toe surg due to osteoarth  . Colonoscopy [name] 10/30/2012    Procedure: COLONOSCOPY;  Surgeon: [name], MD;  Location: [facility];  Service: Endoscopy;   Laterality: [name];  . Cardiac catheterization  1997    Non-obstructive disease  . Cardiac catheterization  12/24/2012    mild < 20% LCx, prox 30% RCA; LVEF 55-65% , moderate pulmonary HTN, moderate AS  . Eye surgery Bilateral cataract  . Amputation Left 04/11/2013    Procedure: AMPUTATION DIGIT Left 3rd toe;  Surgeon: [name], MD;  Location: [place];  Service: Orthopedics;  Laterality: Left;  Left 3rd toe amputation at [facility]  . Left and right heart catheterization with coronary angiogram [name] 12/24/2012    Procedure: LEFT AND RIGHT HEART CATHETERIZATION WITH CORONARY ANGIOGRAM;  Surgeon: [name], MD;  [facility];  Service: Cardiovascular;  Laterality: [name];    Outpatient Prescriptions Prior to Visit  Medication Sig Dispense Refill  . albuterol (PROVENTIL HFA;VENTOLIN HFA) 108 (90 BASE) MCG/ACT inhaler Inhale 2 puffs into the lungs 4 (four) times daily as needed for wheezing or shortness of breath.    . allopurinol (ZYLOPRIM) 300 MG tablet Take 1 tablet (300 mg total) by mouth daily. take 1 tablet by mouth with meals 30 tablet 3  . aspirin EC 81 MG tablet Take 81 mg by mouth daily.    . B Complex-C (B-COMPLEX WITH VITAMIN C) tablet Take 1 tablet by mouth daily.    . clotrimazole-betamethasone (LOTRISONE) cream Apply 1 application topically 2 (two) times daily. 45 g 6  . ezetimibe (ZETIA) 10 MG tablet Take 1 tablet (10 mg total) by mouth daily. 30 tablet 2  . finasteride (PROSCAR) 5 MG tablet take 1 tablet by mouth once daily 90 tablet 1  . Fluticasone-Salmeterol (ADVAIR) 250-50 MCG/DOSE AEPB Inhale 1 puff into the lungs daily as needed (for nasal congestion).     . gabapentin (NEURONTIN) 600 MG tablet take 1 tablet by mouth four times a day after meals and at bedtime 120 tablet 1  . metoprolol succinate (TOPROL-XL) 50 MG 24 hr tablet Take 1 tablet (50 mg total) by mouth daily. Take with or immediately following a meal. 30 tablet 3  . Multiple Vitamin (MULTIVITAMIN) capsule Take 1  capsule by mouth daily.    . Niacin CR 1000 MG TBCR Take 1 tablet (1,000 mg total) by mouth 2 (two) times daily. 180 each 3  . omeprazole (PRILOSEC) 10 MG capsule Take 1 capsule (10 mg total) by mouth daily. 30 capsule 3  . oxyCODONE-acetaminophen (PERCOCET) 10-325 MG per tablet Take 1 tablet by mouth every 8 (eight) hours as needed for pain. 1 month supply 70 tablet 0  . potassium chloride SA (K-DUR,KLOR-CON) 20 MEQ tablet Take 1 tablet (20 mEq total) by mouth daily. 90 tablet 1  . Probiotic Product (PROBIOTIC DAILY PO) Take by mouth.    . rosuvastatin (CRESTOR) 40 MG tablet Take  40 mg by mouth daily.    [name] TEMAZEPAM PO Take by mouth.    . traMADol-acetaminophen (ULTRACET) 37.5-325 MG per tablet Take by mouth 4 (four) times daily.  0  . VITAMIN D, CHOLECALCIFEROL, PO Take by mouth.     No facility-administered medications prior to visit.    Allergies  Allergen Reactions  . Brimonidine Tartrate     ALPHAGAN-Shortness of breath  . Brinzolamide     AZOPT- Shortness of breath  . Celecoxib     CELLBREX-confusion  . Codeine     Childhood reaction  . Colchicine     diarrhea  . Diltiazem      leg swelling  . Latanoprost     XALATAN- Shortness of breath  . Rofecoxib      VIOXX- leg swelling  . Timolol Maleate     TIMOPTIC- Aggravated asthma  . Vancomycin     hives/blisters    ROS As per HPI  PE: Blood pressure 120/74, pulse 65, temperature 98.7 F (37.1 C), temperature source Oral, height 6' 1"  (1.854 m), weight 360 lb (163.295 kg), SpO2 98 %. Gen: Alert, well appearing.  Patient is oriented to person, place, time, and situation. CV: RRR, soft systolic murmur, S1 and S2 intact, no diastolic murmur.  No rub or gallop. LUNGS: CTA bilat, nonlabored respsnegative without mass, lesions or tenderness, PROSTATE EXAM: smooth and symmetric without nodules or tendernessEXT: 1+ bilat pitting edema, with additional brawny, lymphedema type soft tissue swelling in both LLs.  Left foot is  wrapped in ACE bandage and he is wearing a post-op shoe. Rectal exam: negative without mass, lesions or tenderness, PROSTATE EXAM: smooth and symmetric without nodules or tenderness.  LABS:   Lab Results  Component Value Date   HGBA1C 5.6 12/23/2012    Lab Results  Component Value Date   TSH 2.36 01/28/2014   Lab Results  Component Value Date   WBC 8.7 03/27/2014   HGB 12.8* 03/27/2014   HCT 38.3* 03/27/2014   MCV 90.1 03/27/2014   PLT 160 03/27/2014   Lab Results  Component Value Date   CREATININE 0.81 03/27/2014   BUN 18 03/27/2014   NA 138 03/27/2014   K 4.3 03/27/2014   CL 99 03/27/2014   CO2 26 03/27/2014   Lab Results  Component Value Date   ALT 36 03/27/2014   AST 31 03/27/2014   ALKPHOS 74 03/27/2014   BILITOT 0.5 03/27/2014   Lab Results  Component Value Date   CHOL 120 09/10/2013   Lab Results  Component Value Date   HDL 60.00 09/10/2013   Lab Results  Component Value Date   LDLCALC 37 09/10/2013   Lab Results  Component Value Date   TRIG 114.0 09/10/2013   Lab Results  Component Value Date   CHOLHDL 2 09/10/2013   Lab Results  Component Value Date   PSA 0.40 09/25/2013    IMPRESSION AND PLAN:  1) DOE; chronic, stable. Multifactorial.  2) Chronic generalized LE weakness + balance problems: multifactorial. Improving with neuroPT.  3) Anxiety and depression, leading to mild memory/cognitive impairment. Reviewed his neuropsych testing results and recommendations. I have started cymbalta today at 53m qd x 2 wks, then increase to 619mqd.  4) Hyperlipidemia, mixed.  The current medical regimen is effective;  continue present plan and medications. FLP today, CMET today.  5) HTN; The current medical regimen is effective;  continue present plan and medications. Lytes/cr today.  6) Prostate ca screening: DRE normal today.  PSA drawn today.  7) Left foot/toes surgery yesterday: will do necessary paperwork so he can get the custom  shoes that the orthopedist rx'd--whenever I am sent the paperwork.  An After Visit Summary was printed and given to the patient.  FOLLOW UP: Return in about 4 weeks (around 11/04/2014) for f/u anxiety/depression.

## 2014-10-07 NOTE — Patient Instructions (Signed)
Take duloxetine (generic for cymbalta) 21m cap once a day for 14d, then increase to 2 caps once a day.

## 2014-10-09 ENCOUNTER — Encounter: Payer: Self-pay | Admitting: Family Medicine

## 2014-10-13 ENCOUNTER — Ambulatory Visit: Payer: Medicare Other | Admitting: Physical Therapy

## 2014-10-13 ENCOUNTER — Ambulatory Visit: Payer: Medicare Other | Admitting: Neurology

## 2014-10-13 DIAGNOSIS — R29898 Other symptoms and signs involving the musculoskeletal system: Secondary | ICD-10-CM

## 2014-10-13 DIAGNOSIS — R269 Unspecified abnormalities of gait and mobility: Secondary | ICD-10-CM

## 2014-10-13 DIAGNOSIS — R6889 Other general symptoms and signs: Secondary | ICD-10-CM

## 2014-10-13 DIAGNOSIS — Z029 Encounter for administrative examinations, unspecified: Secondary | ICD-10-CM

## 2014-10-13 DIAGNOSIS — Z7409 Other reduced mobility: Secondary | ICD-10-CM

## 2014-10-13 DIAGNOSIS — R531 Weakness: Secondary | ICD-10-CM

## 2014-10-14 ENCOUNTER — Other Ambulatory Visit: Payer: Self-pay | Admitting: Registered Nurse

## 2014-10-14 ENCOUNTER — Other Ambulatory Visit: Payer: Self-pay | Admitting: Physical Medicine & Rehabilitation

## 2014-10-14 ENCOUNTER — Encounter: Payer: Self-pay | Admitting: Registered Nurse

## 2014-10-14 ENCOUNTER — Encounter: Payer: Self-pay | Admitting: Neurology

## 2014-10-14 ENCOUNTER — Encounter: Payer: Self-pay | Admitting: Physical Therapy

## 2014-10-14 ENCOUNTER — Encounter: Payer: Medicare Other | Attending: Registered Nurse | Admitting: Registered Nurse

## 2014-10-14 DIAGNOSIS — M4806 Spinal stenosis, lumbar region: Secondary | ICD-10-CM | POA: Diagnosis not present

## 2014-10-14 DIAGNOSIS — M545 Low back pain, unspecified: Secondary | ICD-10-CM

## 2014-10-14 DIAGNOSIS — G8929 Other chronic pain: Secondary | ICD-10-CM

## 2014-10-14 DIAGNOSIS — K219 Gastro-esophageal reflux disease without esophagitis: Secondary | ICD-10-CM | POA: Diagnosis not present

## 2014-10-14 DIAGNOSIS — E780 Pure hypercholesterolemia: Secondary | ICD-10-CM | POA: Diagnosis not present

## 2014-10-14 DIAGNOSIS — M1990 Unspecified osteoarthritis, unspecified site: Secondary | ICD-10-CM | POA: Diagnosis not present

## 2014-10-14 DIAGNOSIS — G608 Other hereditary and idiopathic neuropathies: Secondary | ICD-10-CM

## 2014-10-14 DIAGNOSIS — M109 Gout, unspecified: Secondary | ICD-10-CM | POA: Insufficient documentation

## 2014-10-14 DIAGNOSIS — G609 Hereditary and idiopathic neuropathy, unspecified: Secondary | ICD-10-CM | POA: Diagnosis not present

## 2014-10-14 DIAGNOSIS — Z5181 Encounter for therapeutic drug level monitoring: Secondary | ICD-10-CM | POA: Diagnosis not present

## 2014-10-14 DIAGNOSIS — M79672 Pain in left foot: Secondary | ICD-10-CM | POA: Diagnosis present

## 2014-10-14 DIAGNOSIS — J449 Chronic obstructive pulmonary disease, unspecified: Secondary | ICD-10-CM | POA: Diagnosis not present

## 2014-10-14 DIAGNOSIS — I2510 Atherosclerotic heart disease of native coronary artery without angina pectoris: Secondary | ICD-10-CM | POA: Insufficient documentation

## 2014-10-14 DIAGNOSIS — Z79899 Other long term (current) drug therapy: Secondary | ICD-10-CM | POA: Diagnosis not present

## 2014-10-14 DIAGNOSIS — M48062 Spinal stenosis, lumbar region with neurogenic claudication: Secondary | ICD-10-CM

## 2014-10-14 MED ORDER — OXYCODONE-ACETAMINOPHEN 10-325 MG PO TABS: 1.0000 | ORAL_TABLET | Freq: Three times a day (TID) | ORAL | Status: DC | PRN

## 2014-10-14 NOTE — Progress Notes (Signed)
Subjective:    Patient ID: Tim [name], male    DOB: Jun 26, 1941, 74 y.o.   MRN: [id number]  HPI: [name] is a 74 year old male who returns for follow up for chronic pain and medication refill. His pain is located in his lower back.He rates his pain 3. His current exercise regime is attending physical therapy once a week, also performing stretching exericises daily. He's using his TENS Unit nightly, which has decreased his pain and has improved his insomnia.  He requested to decrease his percocet to 60 tablets which will be prescribed.  Pain Inventory Average Pain 2 Pain Right Now 3 My pain is intermittent, constant, sharp, burning, dull, stabbing and aching  In the last 24 hours, has pain interfered with the following? General activity 5 Relation with others 5 Enjoyment of life 4 What TIME of day is your pain at its worst? night Sleep (in general) Fair  Pain is worse with: walking, bending, standing and some activites Pain improves with: rest, pacing activities, medication, TENS and injections Relief from Meds: 7  Mobility walk with assistance use a cane how many minutes can you walk? 7 ability to climb steps?  yes do you drive?  yes Do you have any goals in this area?  no  Function retired I need assistance with the following:  household duties and shopping Do you have any goals in this area?  no  Neuro/Psych weakness numbness tremor trouble walking  Prior Studies Any changes since last visit?  no  Physicians involved in your care Any changes since last visit?  no   Family History  Problem Relation Age of Onset  . Hypertension Mother   . Coronary artery disease Mother   . Heart attack Mother   . Pulmonary fibrosis Father     asbestosis  . Neuropathy Mother    History   Social History  . Marital Status: Married    Spouse Name: [name]  . Number of Children: 0  . Years of Education: 20   Occupational History  . engineering     retired     Social History Main Topics  . Smoking status: Never Smoker   . Smokeless tobacco: Never Used  . Alcohol Use: No  . Drug Use: No  . Sexual Activity: Not Currently   Other Topics Concern  . None   Social History Narrative   HSG, [name] - BS, [name] - MS-engineering, 2 years on PhD - [name]. Married - '65 - 53yr/divorced; '76- 3 yrs/divorced; '92 . No children. 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 ACP/Living Will - Yes CPR; long-term Mechanical ventilation as long as he was able to cognate; ok for long term artificial nutrition. Precondition being able to cognate and not to have too much pain.    Past Surgical History  Procedure Laterality Date  . Hemorrhoid surgery    . Vitrectomy    . Intraocular lens insertion Bilateral   . Leg surgery Bilateral   . Knee surgery Right   . Traeculectomy Left     eye  . Shoulder arthroscopy  08/30/2011    Procedure: ARTHROSCOPY SHOULDER;  Surgeon: [name] MD;  [place]  Service: Orthopedics;  Laterality: Right;  Right Shoulder Arthroscopy, Debridement, and Decompression  . Tonsillectomy    . Colonoscopy    . Cholecystectomy    . 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  R big toe  surg due to osteoarth  . Colonoscopy [name] 10/30/2012    Procedure: COLONOSCOPY;  Surgeon: [name], MD;  Location: [facility];  Service: Endoscopy;  Laterality: [name];  . Cardiac catheterization  1997    Non-obstructive disease  . Cardiac catheterization  12/24/2012    mild < 20% LCx, prox 30% RCA; LVEF 55-65% , moderate pulmonary HTN, moderate AS  . Eye surgery Bilateral cataract  . Amputation Left 04/11/2013    Procedure: AMPUTATION DIGIT Left 3rd toe;  Surgeon: [name], MD;  Location: [place];  Service: Orthopedics;  Laterality: Left;  Left 3rd toe amputation at [facility]  . Left and right heart catheterization with coronary angiogram [name] 12/24/2012    Procedure: LEFT AND RIGHT HEART CATHETERIZATION WITH  CORONARY ANGIOGRAM;  Surgeon: [name], MD;  Location: [facility] CATH LAB;  Service: Cardiovascular;  Laterality: [name];   Past Medical History  Diagnosis Date  . HYPERCHOLESTEROLEMIA-PURE   . GOUT   . OSA on CPAP     8 cm H2O  . Essential hypertension, benign   . VENOUS INSUFFICIENCY   . ASTHMA   . COPD   . VENTRAL HERNIA   . OSTEOARTHRITIS   . CATARACT, HX OF   . PULMONARY HYPERTENSION, HX OF   . Hallux valgus (acquired)   . Obesity, unspecified   . Osteoarthrosis, unspecified whether generalized or localized, unspecified site   . Unspecified glaucoma   . Moderate aortic stenosis     Per 12/2012 echo- (VTI 1.08 cm^2, Vmax 1.11 cm ^2), mild LA dilatation  . Paroxysmal atrial fibrillation 2003  . Lumbosacral neuritis   . Unspecified hereditary and idiopathic peripheral neuropathy approx age 47    bilat LE's, ? left arm, too.  Feet became progressively numb + left foot pain intermittently.  . Lumbosacral spondylosis   . CAD (coronary artery disease)     Nonobstructive by 12/2012 cath  . PUD (peptic ulcer disease)   . BPH (benign prostatic hypertrophy)   . HH (hiatus hernia)   . Glaucoma   . GERD (gastroesophageal reflux disease)   . Personal history of colonic adenoma 10/30/2012  . Shortness of breath     with exertion  . Peripheral neuropathy     Heredetary; with chronic neuropathic pain  . Normal memory function 08/2014    Neuropsychological testing ([facility]): no cognitive impairment or sign of neurodegenerative disorder.  Likely has adjustment d/o with mixed anxiety/depressed features and may benefit from low dose SNRI.    . IFG (impaired fasting glucose)     needs HbA1c repeat at f/u 11/04/14   There were no vitals taken for this visit.  Opioid Risk Score:   Fall Risk Score: Moderate Fall Risk (6-13 points) (patient previously educated)`1  Depression screen PHQ 2/9  Depression screen PHQ 2/9 09/16/2014  Decreased Interest 1  Down, Depressed, Hopeless  0  PHQ - 2 Score 1  Altered sleeping 2  Tired, decreased energy 2  Change in appetite 1  Feeling bad or failure about yourself  0  Trouble concentrating 0  Moving slowly or fidgety/restless 0  Suicidal thoughts 0  PHQ-9 Score 6     Review of Systems  Respiratory: Positive for apnea and shortness of breath.   Cardiovascular: Positive for leg swelling.  Musculoskeletal: Positive for gait problem.  Neurological: Positive for tremors, weakness and numbness.  All other systems reviewed and are negative.      Objective:   Physical Exam  Constitutional: He is  oriented to person, place, and time. He appears well-developed and well-nourished.  HENT:  Head: Normocephalic and atraumatic.  Neck: Normal range of motion. Neck supple.  Cardiovascular: Normal rate and regular rhythm.   Pulmonary/Chest: Effort normal and breath sounds normal.  Musculoskeletal:  Normal Muscle Bulk and Muscle Testing Reveals: Upper Extremities: Right: Decreased ROM 45 degrees and Muscle strength 5/5 Left: Full ROM and Muscle Strength 5/5 Back without spinal or paraspinal tenderness Lower Extremities: Full ROM and Muscle Strength 5/5 Arises from chair with ease/ Using straight cane for support Narrow Based Gait   Neurological: He is alert and oriented to person, place, and time.  Skin: Skin is warm and dry.  Psychiatric: He has a normal mood and affect.  Nursing note and vitals reviewed.         Assessment & Plan:  1. 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  Refilled: Percocet 10/325 mg ---use one every 8 hours as needed for pain. Decreased to #60. Continue with Gabapentin.  2. DDD lumbar spine with LBP: Neurology Following. Continue with activity and heat therapy.   20 minutes of face to face patient care time was spent during this visit. All questions were encouraged and answered.   F/U in 1 month

## 2014-10-14 NOTE — Therapy (Signed)
[facility] [address] [place], [place], [address] Phone: [phone]   Fax:  [phone]  Physical Therapy Treatment  Patient Details  Name: Tim [name] MRN: [id number] Date of Birth: [birth year] Referring Provider:  Tammi Sou, MD  Encounter Date: 10/13/2014      PT End of Session - 10/14/14 0942    Visit Number 7  G7   Number of Visits 13   Date for PT Re-Evaluation 11/11/14   Authorization Type [employer] Medicare   Authorization Time Period 10-13-14 - 12-12-14   PT Start Time 1104   PT Stop Time 1148   PT Time Calculation (min) 44 min      Past Medical History  Diagnosis Date  . HYPERCHOLESTEROLEMIA-PURE   . GOUT   . OSA on CPAP     8 cm H2O  . Essential hypertension, benign   . VENOUS INSUFFICIENCY   . ASTHMA   . COPD   . VENTRAL HERNIA   . OSTEOARTHRITIS   . CATARACT, HX OF   . PULMONARY HYPERTENSION, HX OF   . Hallux valgus (acquired)   . Obesity, unspecified   . Osteoarthrosis, unspecified whether generalized or localized, unspecified site   . Unspecified glaucoma   . Moderate aortic stenosis     Per 12/2012 echo- (VTI 1.08 cm^2, Vmax 1.11 cm ^2), mild LA dilatation  . Paroxysmal atrial fibrillation 2003  . Lumbosacral neuritis   . Unspecified hereditary and idiopathic peripheral neuropathy approx age 74    bilat LE's, ? left arm, too.  Feet became progressively numb + left foot pain intermittently.  . Lumbosacral spondylosis   . CAD (coronary artery disease)     Nonobstructive by 12/2012 cath  . PUD (peptic ulcer disease)   . BPH (benign prostatic hypertrophy)   . HH (hiatus hernia)   . Glaucoma   . GERD (gastroesophageal reflux disease)   . Personal history of colonic adenoma 10/30/2012  . Shortness of breath     with exertion  . Peripheral neuropathy     Heredetary; with chronic neuropathic pain  . Normal memory function 08/2014    Neuropsychological testing ([facility]): no cognitive impairment or sign of neurodegenerative disorder.  Likely has adjustment d/o with mixed anxiety/depressed features and may benefit from low dose SNRI.    . IFG (impaired fasting glucose)     needs HbA1c repeat at f/u 11/04/14    Past Surgical History  Procedure Laterality Date  . Hemorrhoid surgery    . Vitrectomy    . Intraocular lens insertion Bilateral   . Leg surgery Bilateral   . Knee surgery Right   . Traeculectomy Left     eye  . Shoulder arthroscopy  08/30/2011    Procedure: ARTHROSCOPY SHOULDER;  Surgeon: [name], MD;  Location: [facility];  Service: Orthopedics;  Laterality: Right;  Right Shoulder Arthroscopy, Debridement, and Decompression  . Tonsillectomy    . Colonoscopy    . Cholecystectomy    . 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  R big toe surg due to osteoarth  . Colonoscopy [name] 10/30/2012    Procedure: COLONOSCOPY;  Surgeon: [name], MD;  Location: [facility];  Service: Endoscopy;  Laterality: [name];  . Cardiac catheterization  1997    Non-obstructive disease  . Cardiac catheterization  12/24/2012    mild < 20% LCx, prox 30% RCA; LVEF 55-65% , moderate pulmonary HTN, moderate AS  . Eye  surgery Bilateral cataract  . Amputation Left 04/11/2013    Procedure: AMPUTATION DIGIT Left 3rd toe;  Surgeon: [name], MD;  Location: [facility];  Service: Orthopedics;  Laterality: Left;  Left 3rd toe amputation at [facility]  . Left and right heart catheterization with coronary angiogram [name] 12/24/2012    Procedure: LEFT AND RIGHT HEART CATHETERIZATION WITH CORONARY ANGIOGRAM;  Surgeon: [name], MD;  Location: [name] CATH LAB;  Service: Cardiovascular;  Laterality: [name];    There were no vitals filed for this visit.  Visit Diagnosis:  Decreased strength, endurance, and mobility - Plan: PT plan of care cert/re-cert  Abnormality of gait - Plan: PT plan of care cert/re-cert  Bilateral leg weakness - Plan: PT plan of care cert/re-cert       Subjective Assessment - 10/14/14 0937    Subjective Pt. reports he had 5 days after 2 previous PT session that he could not walk well - states that bursitis resolved after that treatment but he was unable to walk steady   Pertinent History R shoulder sx 2 years ago;  back injury 40 yrs ago; has used cane since about 10 yrs ago    How long can you stand comfortably? about 1 minute   How long can you walk comfortably? 10 minutes   Patient Stated Goals increase leg strength   Currently in Pain? No/denies                       [facility] Adult PT Treatment/Exercise - 10/14/14 0001    Ambulation/Gait   Ambulation/Gait Yes   Ambulation/Gait Assistance 5: Supervision   Ambulation Distance (Feet) 240 Feet   Assistive device --  carried cane   Gait Pattern Wide base of support   Ambulation Surface Level;Indoor   Lumbar Exercises: Standing   Other Standing Lumbar Exercises 3#weigth used on bil. LE's for hip flexion, extension, and abduction x 10 reps each   Lumbar Exercises: Seated   Long Arc Quad on Chair Both;1 set;10 reps  3# used on bil. LE's   Knee/Hip Exercises: Aerobic   Stationary Bike scifit level 1.3 with UE's and LE's x 5"     Neuro Re-ed; single limb stance activities inside bars with UE support prn with SBA             PT Short Term Goals - 08/26/14 0752    PT SHORT TERM GOAL #1   Title same as LTG's           PT Long Term Goals - 10/14/14 0947    PT LONG TERM GOAL #1   Title Pt. will increase Berg balance test score to >/= 47/56 to decr. fall risk.   Baseline met 10-06-14 with score 47/56   Status Achieved   PT LONG TERM GOAL #2   Title Increase endurance/activity tolerance so pt. able to ambulate 5" with cane without requiring a seated rest period.   Baseline not met - pt. amb. 3 1/2 mins prior to needing seated rest period - 10-06-14   Time 4   Period Weeks   Status On-going   PT LONG TERM GOAL #3   Title Pt. 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 gait velocity to >/=  2.9 ft/sec without cane for incr. gait efficiency.   Status On-going   PT LONG TERM GOAL #5   Title Independent in HEP for balance and strengthening.   Status Achieved   Additional Long Term Goals   Additional Long Term Goals Yes   PT LONG TERM GOAL #6   Title Increase Berg score to >/= 50/56 to decr. fall risk   Baseline 47/56 on 10-06-14   Time 4   Period Weeks   Status New               Plan - 10/14/14 4431    Clinical Impression Statement Pt. slowly progressing towards goals -limited by activity tolerance and c/o R hip pain iwth prolonged standing   Pt will benefit from skilled therapeutic intervention in order to improve on the following deficits Difficulty walking;Decreased activity tolerance;Decreased endurance;Impaired sensation;Obesity;Decreased balance;Decreased mobility;Decreased strength   Rehab Potential Good   PT Frequency 2x / week   PT Duration 4 weeks   PT Treatment/Interventions ADLs/Self Care Home Management;Therapeutic activities;Patient/family education;Therapeutic exercise;Gait training;Balance training;Stair training;Neuromuscular re-education;Functional mobility training   PT Next Visit Plan cont gait and strengthening exercises   PT Home Exercise Plan see above   Consulted and Agree with Plan of Care Patient        Problem List Patient Active Problem List   Diagnosis Date Noted  . Postural dizziness 05/24/2014  . Paresthesias with subjective weakness 01/30/2014  . Bilateral thigh pain 01/30/2014  . Abdominal wall pain in right upper quadrant 01/22/2014  . Gross hematuria 09/25/2013  . Intertrigo 09/25/2013  . IBS (irritable bowel syndrome) 09/11/2013  . Chronic pain syndrome 08/06/2013  . CAD (coronary artery disease), native coronary  artery 12/25/2012  . Paroxysmal atrial fibrillation 12/25/2012  . Moderate aortic stenosis 12/25/2012  . Aortic stenosis 12/24/2012  . Personal history of colonic adenoma 10/30/2012  . Diverticulosis of colon (without mention of hemorrhage) 10/30/2012  . Internal hemorrhoids 10/30/2012  . Change in bowel habits intermittent loose stools 10/30/2012  . Routine health maintenance 06/09/2012  . Hereditary peripheral neuropathy 10/10/2011  . Long term current use of anticoagulant - apixiban 08/16/2010  . Dyspnea on exertion 09/08/2009  . HYPERCHOLESTEROLEMIA-PURE 01/13/2009  . Essential hypertension, benign 01/13/2009  . EDEMA 01/13/2009  . GOUT 01/12/2009  . GLAUCOMA 01/12/2009  . VENOUS INSUFFICIENCY 01/12/2009  . COPD (chronic obstructive pulmonary disease) 01/12/2009  . VENTRAL HERNIA 01/12/2009  . OSTEOARTHRITIS 01/12/2009  . ARTHRITIS 01/12/2009  . DEGENERATIVE DISC DISEASE 01/12/2009  . CATARACT, HX OF 01/12/2009  . HEART MURMUR, HX OF 01/12/2009  . BENIGN PROSTATIC HYPERTROPHY, HX OF 01/12/2009  . Obesity 01/17/2007  . OBSTRUCTIVE SLEEP APNEA 01/17/2007  . ASTHMA 01/17/2007  . GERD 01/17/2007  . HALLUX VALGUS, ACQUIRED 01/17/2007  . PULMONARY HYPERTENSION, HX OF 01/17/2007    Alda Lea, PT 10/14/2014, 9:59 AM  [facility] [address] [place], [place], [address] Phone: [phone]   Fax:  [phone]

## 2014-10-15 LAB — PMP ALCOHOL METABOLITE (ETG): ETGU: NEGATIVE ng/mL

## 2014-10-16 LAB — OPIATES/OPIOIDS (LC/MS-MS)
Codeine Urine: NEGATIVE ng/mL (ref <50)
HYDROCODONE: NEGATIVE ng/mL (ref <50)
Hydromorphone: NEGATIVE ng/mL (ref <50)
MORPHINE: NEGATIVE ng/mL (ref <50)
NORHYDROCODONE, UR: NEGATIVE ng/mL (ref <50)
Noroxycodone, Ur: 7425 ng/mL (ref <50)
Oxycodone, ur: 1534 ng/mL (ref <50)
Oxymorphone: 959 ng/mL (ref <50)

## 2014-10-16 LAB — TRAMADOL, URINE
N-DESMETHYL-CIS-TRAMADOL: 3287 ng/mL (ref <100)
Tramadol, Urine: 9762 ng/mL (ref <100)

## 2014-10-16 LAB — OXYCODONE, URINE (LC/MS-MS)
NOROXYCODONE, UR: 7425 ng/mL (ref <50)
Oxycodone, ur: 1534 ng/mL (ref <50)
Oxymorphone: 959 ng/mL (ref <50)

## 2014-10-17 LAB — PRESCRIPTION MONITORING PROFILE (SOLSTAS)
Amphetamine/Meth: NEGATIVE ng/mL
BENZODIAZEPINE SCREEN, URINE: NEGATIVE ng/mL
Barbiturate Screen, Urine: NEGATIVE ng/mL
Buprenorphine, Urine: NEGATIVE ng/mL
CANNABINOID SCRN UR: NEGATIVE ng/mL
CARISOPRODOL, URINE: NEGATIVE ng/mL
CREATININE, URINE: 231.8 mg/dL (ref >20.0)
Cocaine Metabolites: NEGATIVE ng/mL
Fentanyl, Ur: NEGATIVE ng/mL
MDMA URINE: NEGATIVE ng/mL
METHADONE SCREEN, URINE: NEGATIVE ng/mL
Meperidine, Ur: NEGATIVE ng/mL
NITRITES URINE, INITIAL: NEGATIVE ug/mL
PH URINE, INITIAL: 5.6 pH (ref 4.5–8.9)
Propoxyphene: NEGATIVE ng/mL
TAPENTADOLUR: NEGATIVE ng/mL
ZOLPIDEM, URINE: NEGATIVE ng/mL

## 2014-10-20 ENCOUNTER — Ambulatory Visit: Payer: Medicare Other | Admitting: Physical Therapy

## 2014-10-21 NOTE — Progress Notes (Signed)
Urine drug screen for this encounter is consistent for prescribed medication 

## 2014-10-27 ENCOUNTER — Ambulatory Visit: Payer: Medicare Other | Admitting: Physical Therapy

## 2014-11-03 ENCOUNTER — Ambulatory Visit: Payer: Medicare Other | Admitting: Physical Therapy

## 2014-11-04 ENCOUNTER — Encounter: Payer: Self-pay | Admitting: Family Medicine

## 2014-11-04 ENCOUNTER — Ambulatory Visit (INDEPENDENT_AMBULATORY_CARE_PROVIDER_SITE_OTHER): Payer: Medicare Other | Admitting: Family Medicine

## 2014-11-04 VITALS — BP 142/76 | HR 83 | Temp 98.4°F | Resp 16 | Wt 355.0 lb

## 2014-11-04 DIAGNOSIS — I10 Essential (primary) hypertension: Secondary | ICD-10-CM | POA: Diagnosis not present

## 2014-11-04 DIAGNOSIS — R7301 Impaired fasting glucose: Secondary | ICD-10-CM | POA: Diagnosis not present

## 2014-11-04 NOTE — Patient Instructions (Signed)
Take one 39m cymbalta (duloxetine) cap once a day for 7d, then take a 381mcap once every other day for 5 doses, then stop this med.

## 2014-11-04 NOTE — Progress Notes (Signed)
OFFICE NOTE  11/04/2014  CC:  Chief Complaint  Patient presents with  . Follow-up   HPI: Patient is a 74 y.o. Caucasian male who is here for 1 mo f/u depression and anxiety that is believed to be causing mild memory/cognitive impairment (neuropsych testing has been done). I started him on cymbalta last visit and he has titrated it as instructed to 7m qd dosing. Labs last visit were stable except glucose was 121 and we were unable to add on a hbA1c.  He is fasting for repeat blood work today.  Denies any change in mood/anxiety or cognitive function on cymbalta.  Has been at 618mqd dose for 3 wks now.  Question of mild nausea but this has only been the last 3 d.  No vomitng.  No diarrhea. Has drastic fluctuations in ability to sleep: sometimes 16-18 hours of sleep, then has to go a day or two with little sleep.  Appetite is "too good".  These things have been chronic/unchanged since getting on cymbalta.  He reports 100% compliance with his CPAP. Denies any past periods of euphoria or manic/hypomanic periods. No racing thoughts.  Denies feeling sad/down, denies feeling hopeless, denies irritability.  No SI or HI. Says he is usually cheerful, has a positive thought pattern most of the time.  No problems with concentration or focus.   He feels a bit skeptical of the neurologist's synopsis (and the neuropsych testing) of his problem: does not really feel like he has unusual anxiety or depression.  He is an introvert.   He says he has always been this way, has only a few friends and seems to be happy with this.  He does not feel like he is isolating himself.  He is a woArchitectural technologistenjoys playing a lot still.  We discussed this today and he opened up and loosened up with me in a way he has not done in the past.    Of note, he says his bp checks at home show average systolic in 13824Mdiastolics never >9>35  Pertinent PMH:  Past medical, surgical, social, and family history reviewed and no  changes are noted since last office visit.  MEDS:  Outpatient Prescriptions Prior to Visit  Medication Sig Dispense Refill  . albuterol (PROVENTIL HFA;VENTOLIN HFA) 108 (90 BASE) MCG/ACT inhaler Inhale 2 puffs into the lungs 4 (four) times daily as needed for wheezing or shortness of breath.    . allopurinol (ZYLOPRIM) 300 MG tablet Take 1 tablet (300 mg total) by mouth daily. take 1 tablet by mouth with meals 30 tablet 3  . aspirin EC 81 MG tablet Take 81 mg by mouth daily.    . B Complex-C (B-COMPLEX WITH VITAMIN C) tablet Take 1 tablet by mouth daily.    . DULoxetine (CYMBALTA) 30 MG capsule 2 caps po qd 60 capsule 1  . ezetimibe (ZETIA) 10 MG tablet Take 1 tablet (10 mg total) by mouth daily. 30 tablet 2  . finasteride (PROSCAR) 5 MG tablet take 1 tablet by mouth once daily 90 tablet 1  . Fluticasone-Salmeterol (ADVAIR) 250-50 MCG/DOSE AEPB Inhale 1 puff into the lungs daily as needed (for nasal congestion).     . gabapentin (NEURONTIN) 600 MG tablet take 1 tablet by mouth four times a day AFTER MEALS AND AT BEDTIME 120 tablet 1  . metoprolol succinate (TOPROL-XL) 50 MG 24 hr tablet Take 1 tablet (50 mg total) by mouth daily. Take with or immediately following a meal. 30 tablet  3  . Multiple Vitamin (MULTIVITAMIN) capsule Take 1 capsule by mouth daily.    . Niacin CR 1000 MG TBCR Take 1 tablet (1,000 mg total) by mouth 2 (two) times daily. 180 each 3  . omeprazole (PRILOSEC) 10 MG capsule Take 1 capsule (10 mg total) by mouth daily. 30 capsule 3  . oxyCODONE-acetaminophen (PERCOCET) 10-325 MG per tablet Take 1 tablet by mouth every 8 (eight) hours as needed for pain. 1 month supply 60 tablet 0  . potassium chloride SA (K-DUR,KLOR-CON) 20 MEQ tablet Take 1 tablet (20 mEq total) by mouth daily. 90 tablet 1  . Probiotic Product (PROBIOTIC DAILY PO) Take by mouth.    . rosuvastatin (CRESTOR) 40 MG tablet Take 40 mg by mouth daily.    [name] TEMAZEPAM PO Take by mouth.    . traMADol-acetaminophen  (ULTRACET) 37.5-325 MG per tablet take 1 tablet by mouth every 6 hours if needed for pain 120 tablet 5  . VITAMIN D, CHOLECALCIFEROL, PO Take by mouth.    . clotrimazole-betamethasone (LOTRISONE) cream Apply 1 application topically 2 (two) times daily. (Patient not taking: Reported on 11/04/2014) 45 g 6   No facility-administered medications prior to visit.    PE: Blood pressure 142/76, pulse 83, temperature 98.4 F (36.9 C), temperature source Temporal, resp. rate 16, weight 355 lb (161.027 kg), SpO2 96 %. Gen: Alert, well appearing.  Patient is oriented to person, place, time, and situation. AFFECT: pleasant, lucid thought and speech. No further exam today.   Lab Results  Component Value Date   HGBA1C 5.6 12/23/2012     Chemistry      Component Value Date/Time   NA 141 10/07/2014 1056   K 4.5 10/07/2014 1056   CL 104 10/07/2014 1056   CO2 31 10/07/2014 1056   BUN 18 10/07/2014 1056   CREATININE 0.73 10/07/2014 1056   CREATININE 0.96 01/14/2014 1656      Component Value Date/Time   CALCIUM 9.1 10/07/2014 1056   ALKPHOS 66 10/07/2014 1056   AST 15 10/07/2014 1056   ALT 13 10/07/2014 1056   BILITOT 0.5 10/07/2014 1056     Lab Results  Component Value Date   CHOL 115 10/07/2014   HDL 61.10 10/07/2014   LDLCALC 39 10/07/2014   TRIG 75.0 10/07/2014   CHOLHDL 2 10/07/2014    IMPRESSION AND PLAN:  1) Mild memory impairment/mild cognitive impairment: In retrospect, and in extensive discussion with him in the office today, I don't think Mr. Caraveo is suffering from either depression or anxiety. He has a quiet, introverted manner about him and reports being this way his entire life and does not feel like he has any problem from a psychological/mood standpoint.   Will ween him off cymbalta: 74m qd x 7d, then 352mqod x 5 doses, then stop. Call if any probs with this.  2) IFG; has had several year history of this.   Needs repeat of fasting glucose today AND will do HbA1c  today.  3) HTN; well controlled.  Continue current meds. Last lytes/cr stable 1 mo ago.  Spent 30 min with pt today, with >50% of this time spent in counseling and care coordination regarding the above problems.  An After Visit Summary was printed and given to the patient.  FOLLOW UP: 4 mo

## 2014-11-04 NOTE — Progress Notes (Signed)
Pre visit review using our clinic review tool, if applicable. No additional management support is needed unless otherwise documented below in the visit note. 

## 2014-11-05 ENCOUNTER — Ambulatory Visit: Payer: Medicare Other | Admitting: Physical Therapy

## 2014-11-05 LAB — BASIC METABOLIC PANEL
BUN: 16 mg/dL (ref 6–23)
CHLORIDE: 103 meq/L (ref 96–112)
CO2: 31 mEq/L (ref 19–32)
Calcium: 10 mg/dL (ref 8.4–10.5)
Creatinine, Ser: 0.75 mg/dL (ref 0.40–1.50)
GFR: 107.92 mL/min (ref >60.00)
Glucose, Bld: 123 mg/dL — ABNORMAL HIGH (ref 70–99)
Potassium: 4.6 mEq/L (ref 3.5–5.1)
SODIUM: 140 meq/L (ref 135–145)

## 2014-11-05 LAB — HEMOGLOBIN A1C: Hgb A1c MFr Bld: 5.7 % (ref 4.6–6.5)

## 2014-11-06 ENCOUNTER — Encounter: Payer: Self-pay | Admitting: Family Medicine

## 2014-11-10 ENCOUNTER — Ambulatory Visit: Payer: Medicare Other | Admitting: Physical Therapy

## 2014-11-11 ENCOUNTER — Encounter: Payer: Self-pay | Admitting: Registered Nurse

## 2014-11-11 ENCOUNTER — Encounter: Payer: Medicare Other | Attending: Registered Nurse | Admitting: Registered Nurse

## 2014-11-11 VITALS — BP 165/67 | HR 84 | Resp 14

## 2014-11-11 DIAGNOSIS — M109 Gout, unspecified: Secondary | ICD-10-CM | POA: Diagnosis not present

## 2014-11-11 DIAGNOSIS — E780 Pure hypercholesterolemia: Secondary | ICD-10-CM | POA: Insufficient documentation

## 2014-11-11 DIAGNOSIS — J449 Chronic obstructive pulmonary disease, unspecified: Secondary | ICD-10-CM | POA: Insufficient documentation

## 2014-11-11 DIAGNOSIS — M79672 Pain in left foot: Secondary | ICD-10-CM | POA: Diagnosis present

## 2014-11-11 DIAGNOSIS — M1990 Unspecified osteoarthritis, unspecified site: Secondary | ICD-10-CM | POA: Diagnosis not present

## 2014-11-11 DIAGNOSIS — K219 Gastro-esophageal reflux disease without esophagitis: Secondary | ICD-10-CM | POA: Diagnosis not present

## 2014-11-11 DIAGNOSIS — M545 Low back pain, unspecified: Secondary | ICD-10-CM

## 2014-11-11 DIAGNOSIS — G608 Other hereditary and idiopathic neuropathies: Secondary | ICD-10-CM

## 2014-11-11 DIAGNOSIS — I2510 Atherosclerotic heart disease of native coronary artery without angina pectoris: Secondary | ICD-10-CM | POA: Diagnosis not present

## 2014-11-11 DIAGNOSIS — Z5181 Encounter for therapeutic drug level monitoring: Secondary | ICD-10-CM | POA: Diagnosis not present

## 2014-11-11 DIAGNOSIS — Z79899 Other long term (current) drug therapy: Secondary | ICD-10-CM | POA: Diagnosis not present

## 2014-11-11 DIAGNOSIS — G609 Hereditary and idiopathic neuropathy, unspecified: Secondary | ICD-10-CM | POA: Insufficient documentation

## 2014-11-11 DIAGNOSIS — G8929 Other chronic pain: Secondary | ICD-10-CM

## 2014-11-11 MED ORDER — OXYCODONE-ACETAMINOPHEN 10-325 MG PO TABS: 1.0000 | ORAL_TABLET | Freq: Three times a day (TID) | ORAL | Status: DC | PRN

## 2014-11-11 MED ORDER — GABAPENTIN 600 MG PO TABS: ORAL_TABLET | ORAL | Status: DC

## 2014-11-11 NOTE — Progress Notes (Signed)
Subjective:    Patient ID: Tim [name], male    DOB: 22-Apr-1941, 74 y.o.   MRN: [id number]  HPI: Tim [name] is a 74 year old male who returns for follow up for chronic pain and medication refill. He says his pain is located in his lower back and left foot laterally.He rates his pain 2. His current exercise regime is walking daily for 8 minutes.He's using his TENS Unit nightly, which has decreased his pain and has improved his insomnia. Arrived hypertensive blood pressure checked 144/63, his PCP following. His anti-hypertensive's were decreased due to hypotension.  Pain Inventory Average Pain 5 Pain Right Now 2 My pain is intermittent, sharp, dull, stabbing and aching  In the last 24 hours, has pain interfered with the following? General activity 7 Relation with others 3 Enjoyment of life 6 What TIME of day is your pain at its worst? evening Sleep (in general) Poor  Pain is worse with: walking, bending, standing and some activites Pain improves with: rest, pacing activities, medication and TENS Relief from Meds: 6  Mobility walk with assistance use a cane how many minutes can you walk? 7 ability to climb steps?  yes do you drive?  yes  Function retired I need assistance with the following:  meal prep, household duties and shopping  Neuro/Psych weakness numbness tremor trouble walking  Prior Studies Any changes since last visit?  no  Physicians involved in your care Any changes since last visit?  no   Family History  Problem Relation Age of Onset  . Hypertension Mother   . Coronary artery disease Mother   . Heart attack Mother   . Pulmonary fibrosis Father     asbestosis  . Neuropathy Mother    History   Social History  . Marital Status: Married    Spouse Name: [name]  . Number of Children: 0  . Years of Education: 20   Occupational History  . engineering     retired   Social History Main Topics  . Smoking status: Never Smoker   .  Smokeless tobacco: Never Used  . Alcohol Use: No  . Drug Use: No  . Sexual Activity: Not Currently   Other Topics Concern  . None   Social History Narrative   HSG, [name] - BS, [name] - MS-engineering, 2 years on PhD - [name]. Married - '65 - 61yr/divorced; '76- 3 yrs/divorced; '92 . No children. 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 ACP/Living Will - Yes CPR; long-term Mechanical ventilation as long as he was able to cognate; ok for long term artificial nutrition. Precondition being able to cognate and not to have too much pain.    Past Surgical History  Procedure Laterality Date  . Hemorrhoid surgery    . Vitrectomy    . Intraocular lens insertion Bilateral   . Leg surgery Bilateral   . Knee surgery Right   . Traeculectomy Left     eye  . Shoulder arthroscopy  08/30/2011    Procedure: ARTHROSCOPY SHOULDER;  Surgeon: [name] MD;  Location: [facility]  Service: Orthopedics;  Laterality: Right;  Right Shoulder Arthroscopy, Debridement, and Decompression  . Tonsillectomy    . Colonoscopy    . Cholecystectomy    . 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  R big toe surg due to osteoarth  . Colonoscopy [name] 10/30/2012    Procedure: COLONOSCOPY;  Surgeon: CGatha Mayer MD;  Location: [facility];  Service: Endoscopy;  Laterality: [name];  . Cardiac catheterization  1997    Non-obstructive disease  . Cardiac catheterization  12/24/2012    mild < 20% LCx, prox 30% RCA; LVEF 55-65% , moderate pulmonary HTN, moderate AS  . Eye surgery Bilateral cataract  . Amputation Left 04/11/2013    Procedure: AMPUTATION DIGIT Left 3rd toe;  Surgeon: [name], MD;  Location: [facility];  Service: Orthopedics;  Laterality: Left;  Left 3rd toe amputation at [facility]  . Left and right heart catheterization with coronary angiogram [name] 12/24/2012    Procedure: LEFT AND RIGHT HEART CATHETERIZATION WITH CORONARY ANGIOGRAM;  Surgeon: [name], MD;  Location: [facility] CATH  LAB;  Service: Cardiovascular;  Laterality: [name];   Past Medical History  Diagnosis Date  . HYPERCHOLESTEROLEMIA-PURE   . GOUT   . OSA on CPAP     8 cm H2O  . Essential hypertension, benign   . VENOUS INSUFFICIENCY   . ASTHMA   . COPD   . VENTRAL HERNIA   . OSTEOARTHRITIS   . CATARACT, HX OF   . PULMONARY HYPERTENSION, HX OF   . Hallux valgus (acquired)   . Obesity, unspecified   . Osteoarthrosis, unspecified whether generalized or localized, unspecified site   . Unspecified glaucoma   . Moderate aortic stenosis     Per 12/2012 echo- (VTI 1.08 cm^2, Vmax 1.11 cm ^2), mild LA dilatation  . Paroxysmal atrial fibrillation 2003  . Lumbosacral neuritis   . Unspecified hereditary and idiopathic peripheral neuropathy approx age 62    bilat LE's, ? left arm, too.  Feet became progressively numb + left foot pain intermittently.  . Lumbosacral spondylosis   . CAD (coronary artery disease)     Nonobstructive by 12/2012 cath  . PUD (peptic ulcer disease)   . BPH (benign prostatic hypertrophy)   . HH (hiatus hernia)   . Glaucoma   . GERD (gastroesophageal reflux disease)   . Personal history of colonic adenoma 10/30/2012  . Shortness of breath     with exertion  . Peripheral neuropathy     Heredetary; with chronic neuropathic pain  . Normal memory function 08/2014    Neuropsychological testing ([facility]): no cognitive impairment or sign of neurodegenerative disorder.  Likely has adjustment d/o with mixed anxiety/depressed features and may benefit from low dose SNRI.    . IFG (impaired fasting glucose)     HbA1c's consistently <5.8%   BP 165/67 mmHg  Pulse 84  Resp 14  SpO2 96%  Opioid Risk Score:   Fall Risk Score: Moderate Fall Risk (6-13 points)`1  Depression screen PHQ 2/9  Depression screen PHQ 2/9 09/16/2014  Decreased Interest 1  Down, Depressed, Hopeless 0  PHQ - 2 Score 1  Altered sleeping 2  Tired, decreased energy 2  Change in appetite 1  Feeling  bad or failure about yourself  0  Trouble concentrating 0  Moving slowly or fidgety/restless 0  Suicidal thoughts 0  PHQ-9 Score 6     Review of Systems  Neurological: Positive for tremors, weakness and numbness.  All other systems reviewed and are negative.      Objective:   Physical Exam  Constitutional: He is oriented to person, place, and time. He appears well-developed and well-nourished.  HENT:  Head: Normocephalic and atraumatic.  Neck: Normal range of motion. Neck supple.  Pulmonary/Chest: Effort normal and breath sounds normal.  Musculoskeletal:  Normal Muscle Bulk and Muscle Testing Reveals: Upper  Extremities: Right: Decreased ROM 45 Degrees and Muscle strength 4/5 Left: Full ROM and Muscle strength 5/5 Right Spine of Scapula Tenderness Thoracic Paraspinal Tenderness: T-10- T-12 Lower Extremities: Full ROM and Muscle strength 5/5 Arises from chair with ease Narrow Based Gait  Neurological: He is alert and oriented to person, place, and time.  Skin: Skin is warm and dry.  Psychiatric: He has a normal mood and affect.  Nursing note and vitals reviewed.         Assessment & Plan:  1. 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  Refilled: Percocet 10/325 mg ---use one every 8 hours as needed for pain.#60. Continue with Gabapentin.  2. DDD lumbar spine with LBP: Neurology Following. Continue with activity and heat therapy.   20 minutes of face to face patient care time was spent during this visit. All questions were encouraged and answered.   F/U in 1 month

## 2014-11-12 ENCOUNTER — Ambulatory Visit: Payer: Medicare Other | Admitting: Physical Therapy

## 2014-11-17 ENCOUNTER — Ambulatory Visit: Payer: Medicare Other | Admitting: Physical Therapy

## 2014-11-19 ENCOUNTER — Other Ambulatory Visit: Payer: Self-pay | Admitting: *Deleted

## 2014-11-19 ENCOUNTER — Other Ambulatory Visit: Payer: Self-pay | Admitting: Cardiology

## 2014-11-19 ENCOUNTER — Ambulatory Visit: Payer: Medicare Other | Admitting: Physical Therapy

## 2014-11-19 MED ORDER — OMEPRAZOLE 10 MG PO CPDR
10.0000 mg | DELAYED_RELEASE_CAPSULE | Freq: Every day | ORAL | Status: DC
Start: 2014-11-19 — End: 2015-08-25

## 2014-11-19 MED ORDER — FINASTERIDE 5 MG PO TABS: ORAL_TABLET | ORAL | Status: DC

## 2014-11-19 NOTE — Telephone Encounter (Signed)
Fax from [facility]) requesting refill for Omeprazole 4m take 1 cap daily. LOV 11/04/14, up coming ov 03/10/15, last written: 07/10/14 w/3 #30. Rx sent for #30 w/ 6RF.

## 2014-11-19 NOTE — Telephone Encounter (Signed)
Fax from [facility]) requesting refill for finasteride 51m take 1 tab daily. LOV 11/04/14, up coming ov 03/10/15, last written:  02/09/15 w/ 1RF #90. Rx sent for #90 w/ 1 RF.

## 2014-12-02 ENCOUNTER — Ambulatory Visit (INDEPENDENT_AMBULATORY_CARE_PROVIDER_SITE_OTHER): Payer: Medicare Other | Admitting: Cardiology

## 2014-12-02 ENCOUNTER — Encounter: Payer: Self-pay | Admitting: Cardiology

## 2014-12-02 VITALS — BP 152/68 | HR 77 | Ht 73.0 in | Wt 340.1 lb

## 2014-12-02 DIAGNOSIS — I4891 Unspecified atrial fibrillation: Secondary | ICD-10-CM

## 2014-12-02 DIAGNOSIS — I359 Nonrheumatic aortic valve disorder, unspecified: Secondary | ICD-10-CM | POA: Diagnosis not present

## 2014-12-02 DIAGNOSIS — I2510 Atherosclerotic heart disease of native coronary artery without angina pectoris: Secondary | ICD-10-CM

## 2014-12-02 MED ORDER — METOPROLOL SUCCINATE ER 25 MG PO TB24: 25.0000 mg | ORAL_TABLET | Freq: Every day | ORAL | Status: DC

## 2014-12-02 MED ORDER — APIXABAN 5 MG PO TABS: 5.0000 mg | ORAL_TABLET | Freq: Two times a day (BID) | ORAL | Status: DC

## 2014-12-02 MED ORDER — METOPROLOL SUCCINATE ER 50 MG PO TB24: ORAL_TABLET | ORAL | Status: DC

## 2014-12-02 NOTE — Assessment & Plan Note (Signed)
Blood pressure elevated. Increase Toprol to 75 mg daily and follow.

## 2014-12-02 NOTE — Assessment & Plan Note (Signed)
Continue statin. 

## 2014-12-02 NOTE — Progress Notes (Signed)
HPI: FU paroxysmal atrial fibrillation, sleep apnea, and pedal edema. Carotid Dopplers in June of 2014 showed no significant stenosis. Cardiac catheterization in June of 2014 showed a pulmonary artery pressure of 51/26 and pulmonary capillary wedge pressure of 20. Aortic valve area of 1.4 cm. There was a 20% circumflex, 30% right coronary artery. There was no disease in the LAD. Ejection fraction 55-65%. Medical therapy recommended.  Echocardiogram repeated in July 2015. Ejection fraction normal. Grade 1 diastolic dysfunction. Moderate aortic stenosis with a mean gradient of 28 mm of mercury. Trace aortic insufficiency. Mild biatrial enlargement and mild right ventricular enlargement. Chest CT July 2015 showed no acute disease. Since I last saw him, he has mild dyspnea on exertion but much improved. No significant chest pain. No syncope. He has not had any further falls.  Current Outpatient Prescriptions  Medication Sig Dispense Refill  . albuterol (PROVENTIL HFA;VENTOLIN HFA) 108 (90 BASE) MCG/ACT inhaler Inhale 2 puffs into the lungs 4 (four) times daily as needed for wheezing or shortness of breath.    . allopurinol (ZYLOPRIM) 300 MG tablet Take 1 tablet (300 mg total) by mouth daily. take 1 tablet by mouth with meals 30 tablet 3  . B Complex-C (B-COMPLEX WITH VITAMIN C) tablet Take 1 tablet by mouth daily.    . clotrimazole-betamethasone (LOTRISONE) cream Apply 1 application topically 2 (two) times daily. 45 g 6  . CRESTOR 40 MG tablet take 1 tablet by mouth once daily 90 tablet 0  . ezetimibe (ZETIA) 10 MG tablet Take 1 tablet (10 mg total) by mouth daily. 30 tablet 2  . finasteride (PROSCAR) 5 MG tablet take 1 tablet by mouth once daily 90 tablet 1  . fluticasone (FLONASE) 50 MCG/ACT nasal spray Place 2 sprays into both nostrils as needed for allergies or rhinitis.    . Fluticasone-Salmeterol (ADVAIR) 250-50 MCG/DOSE AEPB Inhale 1 puff into the lungs daily as needed (for nasal  congestion).     . gabapentin (NEURONTIN) 600 MG tablet take 1 tablet by mouth four times a day AFTER MEALS AND AT BEDTIME 120 tablet 1  . metoprolol succinate (TOPROL-XL) 50 MG 24 hr tablet Take 1 tablet (50 mg total) by mouth daily. Take with or immediately following a meal. 30 tablet 3  . Multiple Vitamin (MULTIVITAMIN) capsule Take 1 capsule by mouth daily.    . Niacin CR 1000 MG TBCR Take 1 tablet (1,000 mg total) by mouth 2 (two) times daily. 180 each 3  . omeprazole (PRILOSEC) 10 MG capsule Take 1 capsule (10 mg total) by mouth daily. 30 capsule 6  . oxyCODONE-acetaminophen (PERCOCET) 10-325 MG per tablet Take 1 tablet by mouth every 8 (eight) hours as needed for pain. 1 month supply 60 tablet 0  . potassium chloride SA (K-DUR,KLOR-CON) 20 MEQ tablet Take 1 tablet (20 mEq total) by mouth daily. 90 tablet 1  . Probiotic Product (PROBIOTIC DAILY PO) Take by mouth as needed.     . rosuvastatin (CRESTOR) 40 MG tablet Take 40 mg by mouth daily.    [name] TEMAZEPAM PO Take 15 mg by mouth as needed.     . traMADol-acetaminophen (ULTRACET) 37.5-325 MG per tablet take 1 tablet by mouth every 6 hours if needed for pain 120 tablet 5  . VITAMIN D, CHOLECALCIFEROL, PO Take by mouth.    [name] aspirin EC 81 MG tablet Take 81 mg by mouth daily.     No current facility-administered medications for this visit.  Past Medical History  Diagnosis Date  . HYPERCHOLESTEROLEMIA-PURE   . GOUT   . OSA on CPAP     8 cm H2O  . Essential hypertension, benign   . VENOUS INSUFFICIENCY   . ASTHMA   . COPD   . VENTRAL HERNIA   . OSTEOARTHRITIS   . CATARACT, HX OF   . PULMONARY HYPERTENSION, HX OF   . Hallux valgus (acquired)   . Obesity, unspecified   . Osteoarthrosis, unspecified whether generalized or localized, unspecified site   . Unspecified glaucoma   . Moderate aortic stenosis     Per 12/2012 echo- (VTI 1.08 cm^2, Vmax 1.11 cm ^2), mild LA dilatation  . Paroxysmal atrial fibrillation 2003  .  Lumbosacral neuritis   . Unspecified hereditary and idiopathic peripheral neuropathy approx age 59    bilat LE's, ? left arm, too.  Feet became progressively numb + left foot pain intermittently.  . Lumbosacral spondylosis   . CAD (coronary artery disease)     Nonobstructive by 12/2012 cath  . PUD (peptic ulcer disease)   . BPH (benign prostatic hypertrophy)   . HH (hiatus hernia)   . Glaucoma   . GERD (gastroesophageal reflux disease)   . Personal history of colonic adenoma 10/30/2012  . Shortness of breath     with exertion  . Peripheral neuropathy     Heredetary; with chronic neuropathic pain  . Normal memory function 08/2014    Neuropsychological testing ([facility]): no cognitive impairment or sign of neurodegenerative disorder.  Likely has adjustment d/o with mixed anxiety/depressed features and may benefit from low dose SNRI.    . IFG (impaired fasting glucose)     HbA1c's consistently <5.8%    Past Surgical History  Procedure Laterality Date  . Hemorrhoid surgery    . Vitrectomy    . Intraocular lens insertion Bilateral   . Leg surgery Bilateral   . Knee surgery Right   . Traeculectomy Left     eye  . Shoulder arthroscopy  08/30/2011    Procedure: ARTHROSCOPY SHOULDER;  Surgeon: [name], MD;  Location: [facility];  Service: Orthopedics;  Laterality: Right;  Right Shoulder Arthroscopy, Debridement, and Decompression  . Tonsillectomy    . Colonoscopy    . Cholecystectomy    . 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  R big toe surg due to osteoarth  . Colonoscopy [name] 10/30/2012    Procedure: COLONOSCOPY;  Surgeon: [name], MD;  Location: [facility];  Service: Endoscopy;  Laterality: [name];  . Cardiac catheterization  1997    Non-obstructive disease  . Cardiac catheterization  12/24/2012    mild < 20% LCx, prox 30% RCA; LVEF 55-65% , moderate pulmonary HTN, moderate AS  . Eye surgery Bilateral cataract  . Amputation Left 04/11/2013     Procedure: AMPUTATION DIGIT Left 3rd toe;  Surgeon: [name], MD;  Location: [facility];  Service: Orthopedics;  Laterality: Left;  Left 3rd toe amputation at [facility]  . Left and right heart catheterization with coronary angiogram [name] 12/24/2012    Procedure: LEFT AND RIGHT HEART CATHETERIZATION WITH CORONARY ANGIOGRAM;  Surgeon: [name], MD;  Location: [facility] CATH LAB;  Service: Cardiovascular;  Laterality: [name];    History   Social History  . Marital Status: Married    Spouse Name: [name]  . Number of Children: 0  . Years of Education: 20   Occupational History  . engineering     retired  Social History Main Topics  . Smoking status: Never Smoker   . Smokeless tobacco: Never Used  . Alcohol Use: No  . Drug Use: No  . Sexual Activity: Not Currently   Other Topics Concern  . Not on file   Social History Narrative   HSG, [name] - BS, [name] - MS-engineering, 2 years on PhD - [place]. Married - '65 - 94yr/divorced; '76- 3 yrs/divorced; '92 . No children. 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 ACP/Living Will - Yes CPR; long-term Mechanical ventilation as long as he was able to cognate; ok for long term artificial nutrition. Precondition being able to cognate and not to have too much pain.     ROS: no fevers or chills, productive cough, hemoptysis, dysphasia, odynophagia, melena, hematochezia, dysuria, hematuria, rash, seizure activity, orthopnea, PND, pedal edema, claudication. Remaining systems are negative.  Physical Exam: Well-developed obese in no acute distress.  Skin is warm and dry.  HEENT is normal.  Neck is supple.  Chest is clear to auscultation with normal expansion.  Cardiovascular exam is regular rate and rhythm. 3/6 systolic murmur left sternal border. Abdominal exam nontender or distended. No masses palpated. Extremities show trace edema and chronic skin changes. neuro grossly intact  ECG sinus rhythm at a rate of 77, right  bundle branch block.

## 2014-12-02 NOTE — Assessment & Plan Note (Signed)
Repeat echocardiogram in July.

## 2014-12-02 NOTE — Assessment & Plan Note (Signed)
Symptoms have improved. Patient has multiple reasons to be short of breath. These include deconditioning, obesity hypoventilation syndrome, obstructive sleep apnea and diastolic congestive heart failure. He is not volume overloaded on examination. Continue off of Lasix given orthostatic symptoms. Continue CPAP.

## 2014-12-02 NOTE — Assessment & Plan Note (Signed)
Patient remains in sinus rhythm. Continue beta blocker.he has not had any further falls. Discontinue aspirin. Begin apixaban 5 mg BID. Check hemoglobin and renal function in 4 weeks.

## 2014-12-02 NOTE — Patient Instructions (Signed)
Your physician wants you to follow-up in: [place] will receive a reminder letter in the mail two months in advance. If you don't receive a letter, please call our office to schedule the follow-up appointment.   STOP ASPIRIN  START ELIQUIS 5 MG TWICE DAILY  Your physician recommends that you return for lab work in: [place] METOPROLOL SUCC TO 75 MG ONCE DAILY= 1 OF THE 50 MG TABLETS AND 1 OF THE 25 MG TABLETS ONCE DAILY  Your physician has requested that you have an echocardiogram. Echocardiography is a painless test that uses sound waves to create images of your heart. It provides your doctor with information about the size and shape of your heart and how well your heart's chambers and valves are working. This procedure takes approximately one hour. There are no restrictions for this procedure.

## 2014-12-04 ENCOUNTER — Encounter: Payer: Self-pay | Admitting: Family Medicine

## 2014-12-09 ENCOUNTER — Encounter: Payer: Self-pay | Admitting: Registered Nurse

## 2014-12-09 ENCOUNTER — Encounter: Payer: Medicare Other | Attending: Registered Nurse | Admitting: Registered Nurse

## 2014-12-09 VITALS — BP 138/56 | HR 87 | Resp 16

## 2014-12-09 DIAGNOSIS — G608 Other hereditary and idiopathic neuropathies: Secondary | ICD-10-CM

## 2014-12-09 DIAGNOSIS — J449 Chronic obstructive pulmonary disease, unspecified: Secondary | ICD-10-CM | POA: Diagnosis not present

## 2014-12-09 DIAGNOSIS — K219 Gastro-esophageal reflux disease without esophagitis: Secondary | ICD-10-CM | POA: Diagnosis not present

## 2014-12-09 DIAGNOSIS — M545 Low back pain, unspecified: Secondary | ICD-10-CM

## 2014-12-09 DIAGNOSIS — Z79899 Other long term (current) drug therapy: Secondary | ICD-10-CM | POA: Diagnosis not present

## 2014-12-09 DIAGNOSIS — G609 Hereditary and idiopathic neuropathy, unspecified: Secondary | ICD-10-CM

## 2014-12-09 DIAGNOSIS — E780 Pure hypercholesterolemia: Secondary | ICD-10-CM | POA: Diagnosis not present

## 2014-12-09 DIAGNOSIS — M1990 Unspecified osteoarthritis, unspecified site: Secondary | ICD-10-CM | POA: Insufficient documentation

## 2014-12-09 DIAGNOSIS — Z5181 Encounter for therapeutic drug level monitoring: Secondary | ICD-10-CM

## 2014-12-09 DIAGNOSIS — G8929 Other chronic pain: Secondary | ICD-10-CM

## 2014-12-09 DIAGNOSIS — M79672 Pain in left foot: Secondary | ICD-10-CM | POA: Diagnosis present

## 2014-12-09 DIAGNOSIS — I2510 Atherosclerotic heart disease of native coronary artery without angina pectoris: Secondary | ICD-10-CM | POA: Insufficient documentation

## 2014-12-09 DIAGNOSIS — M109 Gout, unspecified: Secondary | ICD-10-CM | POA: Diagnosis not present

## 2014-12-09 MED ORDER — OXYCODONE-ACETAMINOPHEN 10-325 MG PO TABS: 1.0000 | ORAL_TABLET | Freq: Three times a day (TID) | ORAL | Status: DC | PRN

## 2014-12-09 NOTE — Progress Notes (Signed)
Subjective:    Patient ID: Tim [name], male    DOB: 1941-02-06, 74 y.o.   MRN: [id number]  HPI: [name] is a 74 year old male who returns for follow up for chronic pain and medication refill. He says his pain is located in his lower back and bilateral feet left greater than right.He rates his pain yesterday as 0. His current exercise regime is walking daily for 8 minutes and performing leg strengthening exercises .He's using his TENS Unit nightly, which has not been as effective with  decreasing his pain.  Pain Inventory Average Pain 5 Pain Right Now 0 My pain is sharp, dull, stabbing, tingling and aching  In the last 24 hours, has pain interfered with the following? General activity 8 Relation with others 6 Enjoyment of life 5 What TIME of day is your pain at its worst? night Sleep (in general) NA  Pain is worse with: walking, bending, standing and some activites Pain improves with: rest, pacing activities, medication and TENS Relief from Meds: 6  Mobility walk without assistance use a cane use a walker how many minutes can you walk? 6 ability to climb steps?  yes do you drive?  yes  Function I need assistance with the following:  meal prep, household duties and shopping  Neuro/Psych weakness numbness tremor tingling trouble walking spasms  Prior Studies Any changes since last visit?  no  Physicians involved in your care Any changes since last visit?  no   Family History  Problem Relation Age of Onset  . Hypertension Mother   . Coronary artery disease Mother   . Heart attack Mother   . Pulmonary fibrosis Father     asbestosis  . Neuropathy Mother    History   Social History  . Marital Status: Married    Spouse Name: [name]  . Number of Children: 0  . Years of Education: 20   Occupational History  . engineering     retired   Social History Main Topics  . Smoking status: Never Smoker   . Smokeless tobacco: Never Used  . Alcohol  Use: No  . Drug Use: No  . Sexual Activity: Not Currently   Other Topics Concern  . 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 Married - '65 - 26yr/divorced; '76- 3 yrs/divorced; '92 . No children. 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 ACP/Living Will - Yes CPR; long-term Mechanical ventilation as long as he was able to cognate; ok for long term artificial nutrition. Precondition being able to cognate and not to have too much pain.    Past Surgical History  Procedure Laterality Date  . Hemorrhoid surgery    . Vitrectomy    . Intraocular lens insertion Bilateral   . Leg surgery Bilateral   . Knee surgery Right   . Traeculectomy Left     eye  . Shoulder arthroscopy  08/30/2011    Procedure: ARTHROSCOPY SHOULDER;  Surgeon: Tim Minion MD;  Location: [facility]  Service: Orthopedics;  Laterality: Right;  Right Shoulder Arthroscopy, Debridement, and Decompression  . Tonsillectomy    . Colonoscopy    . Cholecystectomy    . 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  R big toe surg due to osteoarth  . Colonoscopy [name] 10/30/2012    Procedure: COLONOSCOPY;  Surgeon: CGatha Mayer MD;  Location: [facility];  Service: Endoscopy;  Laterality: [name];  . Cardiac catheterization  1997    Non-obstructive disease  . Cardiac catheterization  12/24/2012    mild < 20% LCx, prox 30% RCA; LVEF 55-65% , moderate pulmonary HTN, moderate AS  . Eye surgery Bilateral cataract  . Amputation Left 04/11/2013    Procedure: AMPUTATION DIGIT Left 3rd toe;  Surgeon: Tim Minion, MD;  Location: [facility];  Service: Orthopedics;  Laterality: Left;  Left 3rd toe amputation at [facility]  . Left and right heart catheterization with coronary angiogram [name] 12/24/2012    Procedure: LEFT AND RIGHT HEART CATHETERIZATION WITH CORONARY ANGIOGRAM;  Surgeon: Tim M Martinique, MD;  Location: [facility] C[facility] LAB;  Service: Cardiovascular;   Laterality: [name];   Past Medical History  Diagnosis Date  . HYPERCHOLESTEROLEMIA-PURE   . GOUT   . OSA on CPAP     8 cm H2O  . Essential hypertension, benign   . VENOUS INSUFFICIENCY   . ASTHMA   . COPD   . VENTRAL HERNIA   . OSTEOARTHRITIS   . CATARACT, HX OF   . PULMONARY HYPERTENSION, HX OF   . Hallux valgus (acquired)   . Obesity, unspecified   . Osteoarthrosis, unspecified whether generalized or localized, unspecified site   . Unspecified glaucoma   . Moderate aortic stenosis     Per 12/2012 echo- (VTI 1.08 cm^2, Vmax 1.11 cm ^2), mild LA dilatation  . Paroxysmal atrial fibrillation 2003    Off anticoag for a while due to falls.  Then apixaban started 12/2014.  . Lumbosacral neuritis   . Unspecified hereditary and idiopathic peripheral neuropathy approx age 7    bilat LE's, ? left arm, too.  Feet became progressively numb + left foot pain intermittently.  . Lumbosacral spondylosis   . CAD (coronary artery disease)     Nonobstructive by 12/2012 cath  . PUD (peptic ulcer disease)   . BPH (benign prostatic hypertrophy)   . HH (hiatus hernia)   . Glaucoma   . GERD (gastroesophageal reflux disease)   . Personal history of colonic adenoma 10/30/2012  . Shortness of breath     with exertion  . Peripheral neuropathy     Heredetary; with chronic neuropathic pain  . Normal memory function 08/2014    Neuropsychological testing ([facility]): no cognitive impairment or sign of neurodegenerative disorder.  Likely has adjustment d/o with mixed anxiety/depressed features and may benefit from low dose SNRI.    . IFG (impaired fasting glucose)     HbA1c's consistently <5.8%   BP 138/56 mmHg  Pulse 87  Resp 16  SpO2 97%  Opioid Risk Score:   Fall Risk Score: Moderate Fall Risk (6-13 points) (previously educated and given handout)`1  Depression screen PHQ 2/9  Depression screen PHQ 2/9 09/16/2014  Decreased Interest 1  Down, Depressed, Hopeless 0  PHQ - 2 Score 1   Altered sleeping 2  Tired, decreased energy 2  Change in appetite 1  Feeling bad or failure about yourself  0  Trouble concentrating 0  Moving slowly or fidgety/restless 0  Suicidal thoughts 0  PHQ-9 Score 6     Review of Systems  Musculoskeletal: Positive for gait problem.       Spasms  Neurological: Positive for tremors, weakness and numbness.  All other systems reviewed and are negative.      Objective:   Physical Exam  Constitutional: He is oriented to person, place, and time. He appears well-developed and well-nourished.  HENT:  Head: Normocephalic and atraumatic.  Neck: Normal range of motion. Neck supple.  Cardiovascular: Normal rate and regular rhythm.   Pulmonary/Chest: Effort normal and breath sounds normal.  Musculoskeletal:  Normal Muscle Bulk and Muscle Testing Reveals: Upper Extremities: Right Decreased ROM 45 Degrees and Muscle Strength 5/5 Left: Full ROM and Muscle Strength 5/5 Back without spinal or paraspinal tenderness Lower Extremities: Full ROM and Muscle Strength 5/5 Arises from chair with ease/ Using Cadillac walker for support    Neurological: He is alert and oriented to person, place, and time.  Skin: Skin is warm and dry.  Psychiatric: He has a normal mood and affect.  Nursing note and vitals reviewed.         Assessment & Plan:  1. 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  Refilled: Percocet 10/325 mg ---use one every 8 hours as needed for pain.#60. Continue with Gabapentin.  2. DDD lumbar spine with LBP: Neurology Following. Continue with activity and heat therapy.   20 minutes of face to face patient care time was spent during this visit. All questions were encouraged and answered.   F/U in 1 month

## 2014-12-22 ENCOUNTER — Other Ambulatory Visit: Payer: Self-pay | Admitting: Physical Medicine & Rehabilitation

## 2014-12-23 ENCOUNTER — Other Ambulatory Visit: Payer: Self-pay | Admitting: *Deleted

## 2014-12-23 MED ORDER — EZETIMIBE 10 MG PO TABS: 10.0000 mg | ORAL_TABLET | Freq: Every day | ORAL | Status: DC

## 2014-12-28 ENCOUNTER — Encounter: Payer: Self-pay | Admitting: Nurse Practitioner

## 2014-12-28 ENCOUNTER — Ambulatory Visit (INDEPENDENT_AMBULATORY_CARE_PROVIDER_SITE_OTHER): Payer: Medicare Other | Admitting: Nurse Practitioner

## 2014-12-28 VITALS — BP 145/78 | HR 74 | Temp 97.4°F | Resp 18 | Ht 73.0 in | Wt 343.0 lb

## 2014-12-28 DIAGNOSIS — B370 Candidal stomatitis: Secondary | ICD-10-CM

## 2014-12-28 LAB — CBC
HEMATOCRIT: 38.8 % — AB (ref 39.0–52.0)
HEMOGLOBIN: 12.7 g/dL — AB (ref 13.0–17.0)
MCH: 28.5 pg (ref 26.0–34.0)
MCHC: 32.7 g/dL (ref 30.0–36.0)
MCV: 87 fL (ref 78.0–100.0)
MPV: 9.2 fL (ref 8.6–12.4)
PLATELETS: 179 10*3/uL (ref 150–400)
RBC: 4.46 MIL/uL (ref 4.22–5.81)
RDW: 14.8 % (ref 11.5–15.5)
WBC: 11 10*3/uL — ABNORMAL HIGH (ref 4.0–10.5)

## 2014-12-28 MED ORDER — NYSTATIN 100000 UNIT/ML MT SUSP: 5.0000 mL | Freq: Four times a day (QID) | OROMUCOSAL | Status: DC

## 2014-12-28 NOTE — Progress Notes (Signed)
Pre visit review using our clinic review tool, if applicable. No additional management support is needed unless otherwise documented below in the visit note. 

## 2014-12-28 NOTE — Patient Instructions (Signed)
Start oral medicine. Hold in mouth for 1 minute if able, before swallowing. Use as directed for 7 days. Continue to rinse mouth after using inhalers. Use benzocine throat lozenge for comfort if desires. Start probiotic. Take daily for 3 months. This can help prevent yeast infections.  Get Align, culterelle, or phillips brands.   Thrush, Adult  Ritta Slot, also called oral candidiasis, is a fungal infection that develops in the mouth and throat and on the tongue. It causes white patches to form on the mouth and tongue. Ritta Slot is most common in older adults, but it can occur at any age.  Many cases of thrush are mild, but this infection can also be more serious. Ritta Slot can be a recurring problem for people who have chronic illnesses or who take medicines that limit the body's ability to fight infection. Because these people have difficulty fighting infections, the fungus that causes thrush can spread throughout the body. This can cause life-threatening blood or organ infections. CAUSES  Ritta Slot is usually caused by a yeast called Candida albicans. This fungus is normally present in small amounts in the mouth and on other mucous membranes. It usually causes no harm. However, when conditions are present that allow the fungus to grow uncontrolled, it invades surrounding tissues and becomes an infection. Less often, other Candida species can also lead to thrush.  RISK FACTORS Ritta Slot is more likely to develop in the following people:  People with an impaired ability to fight infection (weakened immune system).   Older adults.   People with HIV.   People with diabetes.   People with dry mouth (xerostomia).   Pregnant women.   People with poor dental care, especially those who have false teeth.   People who use antibiotic medicines.  SIGNS AND SYMPTOMS  Ritta Slot can be a mild infection that causes no symptoms. If symptoms develop, they may include:   A burning feeling in the mouth and throat.  This can occur at the start of a thrush infection.   White patches that adhere to the mouth and tongue. The tissue around the patches may be red, raw, and painful. If rubbed (during tooth brushing, for example), the patches and the tissue of the mouth may bleed easily.   A bad taste in the mouth or difficulty tasting foods.   Cottony feeling in the mouth.   Pain during eating and swallowing. DIAGNOSIS  Your health care provider can usually diagnose thrush by looking in your mouth and asking you questions about your health.  TREATMENT  Medicines that help prevent the growth of fungi (antifungals) are the standard treatment for thrush. These medicines are either applied directly to the affected area (topical) or swallowed (oral). The treatment will depend on the severity of the condition.  Mild Thrush Mild cases of thrush may clear up with the use of an antifungal mouth rinse or lozenges. Treatment usually lasts about 14 days.  Moderate to Severe Thrush  More severe thrush infections that have spread to the esophagus are treated with an oral antifungal medicine. A topical antifungal medicine may also be used.   For some severe infections, a treatment period longer than 14 days may be needed.   Oral antifungal medicines are almost never used during pregnancy because the fetus may be harmed. However, if a pregnant woman has a rare, severe thrush infection that has spread to her blood, oral antifungal medicines may be used. In this case, the risk of harm to the mother and fetus from the  severe thrush infection may be greater than the risk posed by the use of antifungal medicines.  Persistent or Recurrent Thrush For cases of thrush that do not go away or keep coming back, treatment may involve the following:   Treatment may be needed twice as long as the symptoms last.   Treatment will include both oral and topical antifungal medicines.   People with weakened immune systems can take  an antifungal medicine on a continuous basis to prevent thrush infections.  It is important to treat conditions that make you more likely to get thrush, such as diabetes or HIV.  HOME CARE INSTRUCTIONS   Only take over-the-counter or prescription medicine as directed by your health care provider. Talk to your health care provider about an over-the-counter medicine called gentian violet, which kills bacteria and fungi.   Eat plain, unflavored yogurt as directed by your health care provider. Check the label to make sure the yogurt contains live cultures. This yogurt can help healthy bacteria grow in the mouth that can stop the growth of the fungus that causes thrush.   Try these measures to help reduce the discomfort of thrush:   Drink cold liquids such as water or iced tea.   Try flavored ice treats or frozen juices.   Eat foods that are easy to swallow, such as gelatin, ice cream, or custard.   If the patches in your mouth are painful, try drinking from a straw.   Rinse your mouth several times a day with a warm saltwater rinse. You can make the saltwater mixture with 1 tsp (6 g) of salt in 8 fl oz (0.2 L) of warm water.   If you wear dentures, remove the dentures before going to bed, brush them vigorously, and soak them in a cleaning solution as directed by your health care provider.   Women who are breastfeeding should clean their nipples with an antifungal medicine as directed by their health care provider. Dry the nipples after breastfeeding. Applying lanolin-containing body lotion may help relieve nipple soreness.  SEEK MEDICAL CARE IF:  Your symptoms are getting worse or are not improving within 7 days of starting treatment.   You have symptoms of spreading infection, such as white patches on the skin outside of the mouth.   You are nursing and you have redness, burning, or pain in the nipples that is not relieved with treatment.  MAKE SURE YOU:  Understand these  instructions.  Will watch your condition.  Will get help right away if you are not doing well or get worse. Document Released: 03/14/2004 Document Revised: 04/09/2013 Document Reviewed: 01/20/2013 [facility] Patient Information 2015 [place], [place]. This information is not intended to replace advice given to you by your health care provider. Make sure you discuss any questions you have with your health care provider.

## 2014-12-28 NOTE — Progress Notes (Signed)
   Subjective:    Patient ID: Tim [name], male    DOB: [birth year], 74 y.o.   MRN: [id number]  Sore Throat  This is a new problem. The current episode started in the past 7 days. The problem has been unchanged. Neither side of throat is experiencing more pain than the other. There has been no fever. The pain is moderate. Associated symptoms include trouble swallowing ("uncomfortable to swallow). Pertinent negatives include no abdominal pain, congestion, coughing, diarrhea, ear pain, headaches, hoarse voice or shortness of breath.      Review of Systems  HENT: Positive for trouble swallowing ("uncomfortable to swallow). Negative for congestion, ear pain and hoarse voice.   Respiratory: Negative for cough and shortness of breath.   Gastrointestinal: Negative for abdominal pain and diarrhea.  Neurological: Negative for headaches.       Objective:   Physical Exam  Constitutional: He is oriented to person, place, and time. He appears well-developed and well-nourished. No distress.  HENT:  Head: Normocephalic and atraumatic.  Right Ear: External ear normal.  Left Ear: External ear normal.  Uvula swollen, white exudate on posterior side of uvula. Tonsils absent.  Eyes: Conjunctivae are normal. Right eye exhibits no discharge. Left eye exhibits no discharge.  Neck: Normal range of motion. Neck supple. No thyromegaly present.  Cardiovascular: Normal rate.   Pulmonary/Chest: Effort normal.  Lymphadenopathy:    He has no cervical adenopathy.  Neurological: He is alert and oriented to person, place, and time.  Skin: Skin is warm and dry.  Psychiatric: He has a normal mood and affect. His behavior is normal. Thought content normal.  Vitals reviewed.         Assessment & Plan:  1. Oral thrush Likely due to inhaled steroids. DD: viral pharyngitis - nystatin (MYCOSTATIN) 100000 UNIT/ML suspension; Take 5 mLs (500,000 Units total) by mouth 4 (four) times daily.  Dispense: 180 mL;  Refill: 0 See intrsutctions F/u prn

## 2014-12-29 LAB — BASIC METABOLIC PANEL WITH GFR
BUN: 18 mg/dL (ref 6–23)
CALCIUM: 9.5 mg/dL (ref 8.4–10.5)
CO2: 28 meq/L (ref 19–32)
Chloride: 105 mEq/L (ref 96–112)
Creat: 0.79 mg/dL (ref 0.50–1.35)
GFR, Est African American: >89 mL/min
GFR, Est Non African American: 88 mL/min
Glucose, Bld: 128 mg/dL — ABNORMAL HIGH (ref 70–99)
Potassium: 4.6 mEq/L (ref 3.5–5.3)
Sodium: 139 mEq/L (ref 135–145)

## 2015-01-12 ENCOUNTER — Ambulatory Visit (HOSPITAL_BASED_OUTPATIENT_CLINIC_OR_DEPARTMENT_OTHER): Payer: Medicare Other | Admitting: Physical Medicine & Rehabilitation

## 2015-01-12 ENCOUNTER — Encounter: Payer: Medicare Other | Attending: Registered Nurse

## 2015-01-12 ENCOUNTER — Encounter: Payer: Self-pay | Admitting: Physical Medicine & Rehabilitation

## 2015-01-12 VITALS — BP 144/84 | HR 74 | Resp 14

## 2015-01-12 DIAGNOSIS — J449 Chronic obstructive pulmonary disease, unspecified: Secondary | ICD-10-CM | POA: Diagnosis not present

## 2015-01-12 DIAGNOSIS — I2510 Atherosclerotic heart disease of native coronary artery without angina pectoris: Secondary | ICD-10-CM | POA: Insufficient documentation

## 2015-01-12 DIAGNOSIS — G609 Hereditary and idiopathic neuropathy, unspecified: Secondary | ICD-10-CM | POA: Insufficient documentation

## 2015-01-12 DIAGNOSIS — G894 Chronic pain syndrome: Secondary | ICD-10-CM

## 2015-01-12 DIAGNOSIS — K219 Gastro-esophageal reflux disease without esophagitis: Secondary | ICD-10-CM | POA: Insufficient documentation

## 2015-01-12 DIAGNOSIS — M4806 Spinal stenosis, lumbar region: Secondary | ICD-10-CM | POA: Diagnosis not present

## 2015-01-12 DIAGNOSIS — E780 Pure hypercholesterolemia: Secondary | ICD-10-CM | POA: Diagnosis not present

## 2015-01-12 DIAGNOSIS — M109 Gout, unspecified: Secondary | ICD-10-CM | POA: Diagnosis not present

## 2015-01-12 DIAGNOSIS — M48062 Spinal stenosis, lumbar region with neurogenic claudication: Secondary | ICD-10-CM

## 2015-01-12 DIAGNOSIS — M1990 Unspecified osteoarthritis, unspecified site: Secondary | ICD-10-CM | POA: Insufficient documentation

## 2015-01-12 DIAGNOSIS — M79672 Pain in left foot: Secondary | ICD-10-CM | POA: Diagnosis present

## 2015-01-12 MED ORDER — MORPHINE SULFATE ER 15 MG PO TBCR: 15.0000 mg | EXTENDED_RELEASE_TABLET | Freq: Two times a day (BID) | ORAL | Status: DC

## 2015-01-12 MED ORDER — GABAPENTIN 600 MG PO TABS: ORAL_TABLET | ORAL | Status: DC

## 2015-01-12 NOTE — Patient Instructions (Signed)
Morphine in place of oxycodone

## 2015-01-12 NOTE — Progress Notes (Signed)
Subjective:    Patient ID: Tim [name], male    DOB: [birth year], 74 y.o.   MRN: [id number]  HPI  F/u Neuropsych testing without dementia Severe problems with fine motor dexterity ( has hx of peripheral neuropathy)  No falls Lower ext discomfort, Worse in the feet than in the legs or thighs No upper extremity pain.  Ambulates with a cane, independent with self-care but does have trouble getting his socks and shoes on and off Pain Inventory Average Pain 3 Pain Right Now 1 My pain is constant, sharp, stabbing and tingling  In the last 24 hours, has pain interfered with the following? General activity 7 Relation with others 7 Enjoyment of life 6 What TIME of day is your pain at its worst? night Sleep (in general) Poor  Pain is worse with: walking, bending, standing and some activites Pain improves with: rest, pacing activities, medication, TENS and injections Relief from Meds: 5  Mobility walk without assistance use a cane how many minutes can you walk? 5 ability to climb steps?  yes do you drive?  no  Function retired  Neuro/Psych weakness numbness tremor tingling trouble walking  Prior Studies Any changes since last visit?  no  Physicians involved in your care Any changes since last visit?  no   Family History  Problem Relation Age of Onset  . Hypertension Mother   . Coronary artery disease Mother   . Heart attack Mother   . Pulmonary fibrosis Father     asbestosis  . Neuropathy Mother    History   Social History  . Marital Status: Married    Spouse Name: [name]  . Number of Children: 0  . Years of Education: 20   Occupational History  . engineering     retired   Social History Main Topics  . Smoking status: Never Smoker   . Smokeless tobacco: Never Used  . Alcohol Use: No  . Drug Use: No  . Sexual Activity: Not Currently   Other Topics Concern  . None   Social History Narrative   HSG, [name] - BS, [name] -  MS-engineering, 2 years on PhD - [name]. Married - '65 - 81yr/divorced; '76- 3 yrs/divorced; '92 . No children. 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 ACP/Living Will - Yes CPR; long-term Mechanical ventilation as long as he was able to cognate; ok for long term artificial nutrition. Precondition being able to cognate and not to have too much pain.    Past Surgical History  Procedure Laterality Date  . Hemorrhoid surgery    . Vitrectomy    . Intraocular lens insertion Bilateral   . Leg surgery Bilateral   . Knee surgery Right   . Traeculectomy Left     eye  . Shoulder arthroscopy  08/30/2011    Procedure: ARTHROSCOPY SHOULDER;  Surgeon: [name] MD;  [place] [facility]  Service: Orthopedics;  Laterality: Right;  Right Shoulder Arthroscopy, Debridement, and Decompression  . Tonsillectomy    . Colonoscopy    . Cholecystectomy    . 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  R big toe surg due to osteoarth  . Colonoscopy [name] 10/30/2012    Procedure: COLONOSCOPY;  Surgeon: CGatha Mayer MD;  Location: [facility];  Service: Endoscopy;  Laterality: [name];  . Cardiac catheterization  1997    Non-obstructive disease  . Cardiac catheterization  12/24/2012    mild < 20% LCx, prox 30%  RCA; LVEF 55-65% , moderate pulmonary HTN, moderate AS  . Eye surgery Bilateral cataract  . Amputation Left 04/11/2013    Procedure: AMPUTATION DIGIT Left 3rd toe;  Surgeon: [name], MD;  Location: [facility];  Service: Orthopedics;  Laterality: Left;  Left 3rd toe amputation at [facility]  . Left and right heart catheterization with coronary angiogram [name] 12/24/2012    Procedure: LEFT AND RIGHT HEART CATHETERIZATION WITH CORONARY ANGIOGRAM;  Surgeon: [name], MD;  Location: [facility] C[facility] LAB;  Service: Cardiovascular;  Laterality: [name];   Past Medical History  Diagnosis Date  . HYPERCHOLESTEROLEMIA-PURE   . GOUT   . OSA on CPAP     8 cm H2O  . Essential hypertension, benign   .  VENOUS INSUFFICIENCY   . ASTHMA   . COPD   . VENTRAL HERNIA   . OSTEOARTHRITIS   . CATARACT, HX OF   . PULMONARY HYPERTENSION, HX OF   . Hallux valgus (acquired)   . Obesity, unspecified   . Osteoarthrosis, unspecified whether generalized or localized, unspecified site   . Unspecified glaucoma   . Moderate aortic stenosis     Per 12/2012 echo- (VTI 1.08 cm^2, Vmax 1.11 cm ^2), mild LA dilatation  . Paroxysmal atrial fibrillation 2003    Off anticoag for a while due to falls.  Then apixaban started 12/2014.  . Lumbosacral neuritis   . Unspecified hereditary and idiopathic peripheral neuropathy approx age 37    bilat LE's, ? left arm, too.  Feet became progressively numb + left foot pain intermittently.  . Lumbosacral spondylosis   . CAD (coronary artery disease)     Nonobstructive by 12/2012 cath  . PUD (peptic ulcer disease)   . BPH (benign prostatic hypertrophy)   . HH (hiatus hernia)   . Glaucoma   . GERD (gastroesophageal reflux disease)   . Personal history of colonic adenoma 10/30/2012  . Shortness of breath     with exertion  . Peripheral neuropathy     Heredetary; with chronic neuropathic pain  . Normal memory function 08/2014    Neuropsychological testing ([facility]): no cognitive impairment or sign of neurodegenerative disorder.  Likely has adjustment d/o with mixed anxiety/depressed features and may benefit from low dose SNRI.    . IFG (impaired fasting glucose)     HbA1c's consistently <5.8%   BP 144/84 mmHg  Pulse 74  Resp 14  SpO2 97%  Opioid Risk Score:   Fall Risk Score: Moderate Fall Risk (6-13 points)`1  Depression screen PHQ 2/9  Depression screen PHQ 2/9 09/16/2014  Decreased Interest 1  Down, Depressed, Hopeless 0  PHQ - 2 Score 1  Altered sleeping 2  Tired, decreased energy 2  Change in appetite 1  Feeling bad or failure about yourself  0  Trouble concentrating 0  Moving slowly or fidgety/restless 0  Suicidal thoughts 0  PHQ-9  Score 6      Review of Systems  Constitutional:       Night sweats  HENT: Negative.   Eyes: Negative.   Respiratory: Positive for shortness of breath.        Sleep apnea W/CPAP  Cardiovascular: Positive for leg swelling.  Gastrointestinal: Negative.   Endocrine: Negative.   Genitourinary: Negative.   Skin: Positive for rash.  Allergic/Immunologic: Negative.   Neurological: Positive for tremors, weakness and numbness.       Tingling, trouble walking  Hematological: Negative.   Psychiatric/Behavioral: Negative.  Objective:   Physical Exam  Constitutional: He is oriented to person, place, and time. He appears well-developed and well-nourished.  HENT:  Head: Normocephalic and atraumatic.  Eyes: Conjunctivae and EOM are normal. Pupils are equal, round, and reactive to light.  Neurological: He is alert and oriented to person, place, and time.  Psychiatric: He has a normal mood and affect. His behavior is normal.  Nursing note and vitals reviewed.   Morbidly obese male in no acute distress Sensation is absent to pinprick below the knees bilaterally  Absent proprioception at the toes bilaterally Intact proprioception at the ankle bilaterally Musculoskeletal: Status post left hallux amputation 3 minus ankle dorsiflexion and plantar flexion as well as toe flexion and extension bilaterally 5/5 knee extension 4/5 hip extension bilaterally Knees show no evidence of effusion Hands have normal sensation to pinprick he is able to do finger to thumb opposition with good dexterity.      Assessment & Plan:  1. Lower extremity pain as primary complaint left little toe pain. Also has numbness in the distal legs bilaterally. His numbness is most likely related to his peripheral neuropathy , His pain is likely multifactorial combination between the peripheral neuropathy as well as the spinal stenosis Overall I feel the peripheral neuropathy is more severe as compared with spinal  stenosis therefore I do not think any further interventional procedures are likely to be helpful at this time.  Continuing on oxycodone10 mg by mouth 2 times a day Continuing on gabapentin 600 mg by mouth 4 times a day Tramadol 37.5 mg QID  Discussed with patient agrees with plan Monthly visits with nurse practitioner. Every six-month visits with M.D.

## 2015-01-14 ENCOUNTER — Ambulatory Visit (HOSPITAL_COMMUNITY)
Admission: RE | Admit: 2015-01-14 | Discharge: 2015-01-14 | Disposition: A | Discharge status: Home / Self Care | Payer: Medicare Other | Source: Ambulatory Visit | Attending: Cardiology | Admitting: Cardiology

## 2015-01-14 DIAGNOSIS — I517 Cardiomegaly: Secondary | ICD-10-CM | POA: Insufficient documentation

## 2015-01-14 DIAGNOSIS — I700 Atherosclerosis of aorta: Secondary | ICD-10-CM | POA: Insufficient documentation

## 2015-01-14 DIAGNOSIS — I340 Nonrheumatic mitral (valve) insufficiency: Secondary | ICD-10-CM | POA: Insufficient documentation

## 2015-01-14 DIAGNOSIS — I359 Nonrheumatic aortic valve disorder, unspecified: Secondary | ICD-10-CM

## 2015-01-14 DIAGNOSIS — I358 Other nonrheumatic aortic valve disorders: Secondary | ICD-10-CM | POA: Diagnosis present

## 2015-01-22 ENCOUNTER — Other Ambulatory Visit: Payer: Self-pay | Admitting: *Deleted

## 2015-01-22 MED ORDER — ALLOPURINOL 300 MG PO TABS: 300.0000 mg | ORAL_TABLET | Freq: Every day | ORAL | Status: DC

## 2015-01-22 NOTE — Telephone Encounter (Signed)
RF request for allopurinol.  LOV: 11/04/14 Next ov: 03/10/15 Last written: 09/03/14 #30 w/ 3RF

## 2015-02-11 ENCOUNTER — Ambulatory Visit: Payer: Medicare Other | Admitting: Physical Medicine & Rehabilitation

## 2015-02-16 ENCOUNTER — Ambulatory Visit (HOSPITAL_BASED_OUTPATIENT_CLINIC_OR_DEPARTMENT_OTHER): Payer: Medicare Other | Admitting: Physical Medicine & Rehabilitation

## 2015-02-16 ENCOUNTER — Encounter: Payer: Self-pay | Admitting: Physical Medicine & Rehabilitation

## 2015-02-16 ENCOUNTER — Encounter: Payer: Medicare Other | Attending: Registered Nurse

## 2015-02-16 VITALS — BP 166/68 | HR 101

## 2015-02-16 DIAGNOSIS — G894 Chronic pain syndrome: Secondary | ICD-10-CM | POA: Diagnosis not present

## 2015-02-16 DIAGNOSIS — M109 Gout, unspecified: Secondary | ICD-10-CM | POA: Insufficient documentation

## 2015-02-16 DIAGNOSIS — E780 Pure hypercholesterolemia: Secondary | ICD-10-CM | POA: Diagnosis not present

## 2015-02-16 DIAGNOSIS — Z5181 Encounter for therapeutic drug level monitoring: Secondary | ICD-10-CM

## 2015-02-16 DIAGNOSIS — I2510 Atherosclerotic heart disease of native coronary artery without angina pectoris: Secondary | ICD-10-CM | POA: Diagnosis not present

## 2015-02-16 DIAGNOSIS — M79672 Pain in left foot: Secondary | ICD-10-CM | POA: Diagnosis present

## 2015-02-16 DIAGNOSIS — J449 Chronic obstructive pulmonary disease, unspecified: Secondary | ICD-10-CM | POA: Insufficient documentation

## 2015-02-16 DIAGNOSIS — M4806 Spinal stenosis, lumbar region: Secondary | ICD-10-CM

## 2015-02-16 DIAGNOSIS — Z79899 Other long term (current) drug therapy: Secondary | ICD-10-CM | POA: Diagnosis not present

## 2015-02-16 DIAGNOSIS — K219 Gastro-esophageal reflux disease without esophagitis: Secondary | ICD-10-CM | POA: Diagnosis not present

## 2015-02-16 DIAGNOSIS — M1990 Unspecified osteoarthritis, unspecified site: Secondary | ICD-10-CM | POA: Diagnosis not present

## 2015-02-16 DIAGNOSIS — G609 Hereditary and idiopathic neuropathy, unspecified: Secondary | ICD-10-CM | POA: Diagnosis not present

## 2015-02-16 DIAGNOSIS — M48061 Spinal stenosis, lumbar region without neurogenic claudication: Secondary | ICD-10-CM | POA: Insufficient documentation

## 2015-02-16 DIAGNOSIS — M48062 Spinal stenosis, lumbar region with neurogenic claudication: Secondary | ICD-10-CM

## 2015-02-16 MED ORDER — OXYCODONE HCL 10 MG PO TABS: 10.0000 mg | ORAL_TABLET | Freq: Three times a day (TID) | ORAL | Status: DC

## 2015-02-16 MED ORDER — GABAPENTIN 600 MG PO TABS: ORAL_TABLET | ORAL | Status: DC

## 2015-02-16 NOTE — Patient Instructions (Signed)
Spinal Cord Stimulation

## 2015-02-16 NOTE — Progress Notes (Signed)
Subjective:    Patient ID: Tim [name], male    DOB: 01/09/1941, 74 y.o.   MRN: [id number]  HPI Chief complaint is Worsening left lower extremity pain. 74 year old male with history of idiopathic peripheral neuropathy. He has insensate feet. He has had a left great toe amputation. He states that he is awakened at night by pain.No new trauma. No skin lesions. Because of this he was switched from oxycodone 10 g twice a day 2 morphine sulfate extended release 15 mg twice a day he states that this medication was not helpful and he's had a bad month. He does not feel he has any new issues going on just to his Chronic painHas not been related to the same degree as with oxycodone. He remains on gabapentin 600 mg 4 times a day Pain Inventory Average Pain 9 Pain Right Now 3 My pain is sharp, dull, stabbing and aching  In the last 24 hours, has pain interfered with the following? General activity 7 Relation with others 7 Enjoyment of life 4 What TIME of day is your pain at its worst? night Sleep (in general) NA  Pain is worse with: walking, bending, standing and some activites Pain improves with: rest, pacing activities and medication Relief from Meds: 3  Mobility walk without assistance use a cane use a walker how many minutes can you walk? 5 min ability to climb steps?  yes do you drive?  yes  Function retired  Neuro/Psych weakness numbness tremor trouble walking  Prior Studies Any changes since last visit?  no na  Physicians involved in your care Any changes since last visit?  no   Family History  Problem Relation Age of Onset  . Hypertension Mother   . Coronary artery disease Mother   . Heart attack Mother   . Pulmonary fibrosis Father     asbestosis  . Neuropathy Mother    Social History   Social History  . Marital Status: Married    Spouse Name: [name]  . Number of Children: 0  . Years of Education: 20   Occupational History  . engineering    retired   Social History Main Topics  . Smoking status: Never Smoker   . Smokeless tobacco: Never Used  . Alcohol Use: No  . Drug Use: No  . Sexual Activity: Not Currently   Other Topics Concern  . None   Social History Narrative   HSG, [name] - BS, [name] - MS-engineering, 2 years on PhD - [name]. Married - '65 - 32yr/divorced; '76- 3 yrs/divorced; '92 . No children. 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 ACP/Living Will - Yes CPR; long-term Mechanical ventilation as long as he was able to cognate; ok for long term artificial nutrition. Precondition being able to cognate and not to have too much pain.    Past Surgical History  Procedure Laterality Date  . Hemorrhoid surgery    . Vitrectomy    . Intraocular lens insertion Bilateral   . Leg surgery Bilateral   . Knee surgery Right   . Traeculectomy Left     eye  . Shoulder arthroscopy  08/30/2011    Procedure: ARTHROSCOPY SHOULDER;  Surgeon: [name] MD;  [place] [facility]  Service: Orthopedics;  Laterality: Right;  Right Shoulder Arthroscopy, Debridement, and Decompression  . Tonsillectomy    . Colonoscopy    . Cholecystectomy    . 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  R big toe surg due to osteoarth  . Colonoscopy [name] 10/30/2012    Procedure: COLONOSCOPY;  Surgeon: [name], MD;  Location: [facility];  Service: Endoscopy;  Laterality: [name];  . Cardiac catheterization  1997    Non-obstructive disease  . Cardiac catheterization  12/24/2012    mild < 20% LCx, prox 30% RCA; LVEF 55-65% , moderate pulmonary HTN, moderate AS  . Eye surgery Bilateral cataract  . Amputation Left 04/11/2013    Procedure: AMPUTATION DIGIT Left 3rd toe;  Surgeon: [name], MD;  Location: [place];  Service: Orthopedics;  Laterality: Left;  Left 3rd toe amputation at [facility]  . Left and right heart catheterization with coronary angiogram [name] 12/24/2012    Procedure: LEFT AND RIGHT HEART  CATHETERIZATION WITH CORONARY ANGIOGRAM;  Surgeon: [name], MD;  Location[name] C[facility] LAB;  Service: Cardiovascular;  Laterality: [name];   Past Medical History  Diagnosis Date  . HYPERCHOLESTEROLEMIA-PURE   . GOUT   . OSA on CPAP     8 cm H2O  . Essential hypertension, benign   . VENOUS INSUFFICIENCY   . ASTHMA   . COPD   . VENTRAL HERNIA   . OSTEOARTHRITIS   . CATARACT, HX OF   . PULMONARY HYPERTENSION, HX OF   . Hallux valgus (acquired)   . Obesity, unspecified   . Osteoarthrosis, unspecified whether generalized or localized, unspecified site   . Unspecified glaucoma   . Moderate aortic stenosis     Per 12/2012 echo- (VTI 1.08 cm^2, Vmax 1.11 cm ^2), mild LA dilatation  . Paroxysmal atrial fibrillation 2003    Off anticoag for a while due to falls.  Then apixaban started 12/2014.  . Lumbosacral neuritis   . Unspecified hereditary and idiopathic peripheral neuropathy approx age 38    bilat LE's, ? left arm, too.  Feet became progressively numb + left foot pain intermittently.  . Lumbosacral spondylosis   . CAD (coronary artery disease)     Nonobstructive by 12/2012 cath  . PUD (peptic ulcer disease)   . BPH (benign prostatic hypertrophy)   . HH (hiatus hernia)   . Glaucoma   . GERD (gastroesophageal reflux disease)   . Personal history of colonic adenoma 10/30/2012  . Shortness of breath     with exertion  . Peripheral neuropathy     Heredetary; with chronic neuropathic pain  . Normal memory function 08/2014    Neuropsychological testing ([facility]): no cognitive impairment or sign of neurodegenerative disorder.  Likely has adjustment d/o with mixed anxiety/depressed features and may benefit from low dose SNRI.    . IFG (impaired fasting glucose)     HbA1c's consistently <5.8%   BP 166/68 mmHg  Pulse 101  SpO2 96%  Opioid Risk Score:   Fall Risk Score:  `1  Depression screen PHQ 2/9  Depression screen [facility] 2/9 02/16/2015 09/16/2014  Decreased  Interest 0 1  Down, Depressed, Hopeless 0 0  PHQ - 2 Score 0 1  Altered sleeping - 2  Tired, decreased energy - 2  Change in appetite - 1  Feeling bad or failure about yourself  - 0  Trouble concentrating - 0  Moving slowly or fidgety/restless - 0  Suicidal thoughts - 0  PHQ-9 Score - 6     Review of Systems  Respiratory: Positive for apnea and shortness of breath.   Gastrointestinal: Positive for constipation.  Skin: Positive for rash.       Night sweats  Objective:   Physical Exam  Constitutional: He is oriented to person, place, and time. He appears well-developed and well-nourished.  obese  HENT:  Head: Normocephalic and atraumatic.  Eyes: Conjunctivae and EOM are normal. Pupils are equal, round, and reactive to light.  Neurological: He is alert and oriented to person, place, and time. He has normal strength. Gait abnormal.  Ambulates with a cane no evidence of total drag or knee instability  He has no sensation to proprioception in the toes.. He does have proprioceptive sense at the ankle He has 4/5 strength in bilateral ankle dorsiflexors 5 at the knee flexors and knee extensors. He has reduced sensation to light touch as well as pinprick below the knees bilaterally Left foot has evidence of great toe amputation. No other skin breakdown noted  Psychiatric: He has a normal mood and affect.  Nursing note and vitals reviewed.         Assessment & Plan:  1. Lower extremity pain as primary complaint left little toe pain. We discussed other treatment options including nerve blocks as well as spinal cord stimulation. We discussed that nerve blocks can be done this clinic however spinal cord stimulation would require a referral His numbness is most likely related to his peripheral neuropathy , His pain is likely multifactorial combination between the peripheral neuropathy as well as the spinal stenosis Overall I feel the peripheral neuropathy is more severe as  compared with spinal stenosis therefore I do not think any further interventional procedures are likely to be helpful at this time.  Will discontinue morphine sulfate and go back to oxycodone10 mg by mouth But increased to 3 times a day,  This would be an increase of 30 tablets per month. We discussed this and the patient then stated that he thinks he needs more than that. We discussed that this will be the next increment. We'll need to get a urine drug screen as well. Patient has concerns that his wife is going to the hospital and [place] sleeping in a hotel while she gets her treatment and he wants more medicine for that time. He will call us when a surgery date is established. He can also let us know how the 3 time a day dosing is working for him. Continuing on gabapentin 600 mg by mouth 4 times a day  Continue opioid monitoring program. This consists of regular clinic visits, examinations, urine drug screen, pill counts as well as use of [place] controlled substance reporting System.Patient has not had any signs of aberrant drug behavior however today he did ask for more medications even after increased dose was offered. Therefore UDS will be performed  Over half of the 25 min visit was spent counseling and coordinating care.   Monthly visits with nurse practitioner. Every six-month visits with M.D.

## 2015-02-17 ENCOUNTER — Other Ambulatory Visit: Payer: Self-pay | Admitting: Physical Medicine & Rehabilitation

## 2015-02-18 LAB — PMP ALCOHOL METABOLITE (ETG): Ethyl Glucuronide (EtG): NEGATIVE ng/mL

## 2015-02-20 LAB — OPIATES/OPIOIDS (LC/MS-MS)
Codeine Urine: NEGATIVE ng/mL (ref <50)
HYDROCODONE: NEGATIVE ng/mL (ref <50)
Hydromorphone: NEGATIVE ng/mL — AB (ref <50)
Morphine Urine: 11378 ng/mL (ref <50)
NORHYDROCODONE, UR: NEGATIVE ng/mL (ref <50)
NOROXYCODONE, UR: 2230 ng/mL (ref <50)
OXYCODONE, UR: 584 ng/mL (ref <50)
Oxymorphone: 673 ng/mL (ref <50)

## 2015-02-20 LAB — TRAMADOL, URINE
N-DESMETHYL-CIS-TRAMADOL: 3358 ng/mL — AB (ref <100)
Tramadol, Urine: 17991 ng/mL — AB (ref <100)

## 2015-02-20 LAB — OXYCODONE, URINE (LC/MS-MS)
Noroxycodone, Ur: 2230 ng/mL (ref <50)
OXYCODONE, UR: 584 ng/mL (ref <50)
Oxymorphone: 673 ng/mL (ref <50)

## 2015-02-22 LAB — PRESCRIPTION MONITORING PROFILE (SOLSTAS)
AMPHETAMINE/METH: NEGATIVE ng/mL
BARBITURATE SCREEN, URINE: NEGATIVE ng/mL
BUPRENORPHINE, URINE: NEGATIVE ng/mL
Benzodiazepine Screen, Urine: NEGATIVE ng/mL
CANNABINOID SCRN UR: NEGATIVE ng/mL
Carisoprodol, Urine: NEGATIVE ng/mL
Cocaine Metabolites: NEGATIVE ng/mL
Creatinine, Urine: 120.96 mg/dL (ref >20.0)
FENTANYL URINE: NEGATIVE ng/mL
MDMA URINE: NEGATIVE ng/mL
Meperidine, Ur: NEGATIVE ng/mL
Methadone Screen, Urine: NEGATIVE ng/mL
NITRITES URINE, INITIAL: NEGATIVE ug/mL
PROPOXYPHENE: NEGATIVE ng/mL
TAPENTADOLUR: NEGATIVE ng/mL
ZOLPIDEM, URINE: NEGATIVE ng/mL
pH, Initial: 5.4 pH (ref 4.5–8.9)

## 2015-03-03 ENCOUNTER — Other Ambulatory Visit: Payer: Self-pay | Admitting: Physical Medicine & Rehabilitation

## 2015-03-03 ENCOUNTER — Other Ambulatory Visit: Payer: Self-pay | Admitting: Cardiology

## 2015-03-10 ENCOUNTER — Ambulatory Visit: Payer: Medicare Other | Admitting: Family Medicine

## 2015-03-10 NOTE — Progress Notes (Signed)
Urine drug screen for this encounter is consistent for prescribed medication 

## 2015-03-16 ENCOUNTER — Encounter: Payer: Medicare Other | Attending: Registered Nurse | Admitting: Registered Nurse

## 2015-03-16 ENCOUNTER — Encounter: Payer: Self-pay | Admitting: Registered Nurse

## 2015-03-16 VITALS — BP 133/62 | HR 80

## 2015-03-16 DIAGNOSIS — M109 Gout, unspecified: Secondary | ICD-10-CM | POA: Insufficient documentation

## 2015-03-16 DIAGNOSIS — G609 Hereditary and idiopathic neuropathy, unspecified: Secondary | ICD-10-CM | POA: Diagnosis not present

## 2015-03-16 DIAGNOSIS — M79672 Pain in left foot: Secondary | ICD-10-CM | POA: Diagnosis present

## 2015-03-16 DIAGNOSIS — Z5181 Encounter for therapeutic drug level monitoring: Secondary | ICD-10-CM

## 2015-03-16 DIAGNOSIS — K219 Gastro-esophageal reflux disease without esophagitis: Secondary | ICD-10-CM | POA: Insufficient documentation

## 2015-03-16 DIAGNOSIS — G894 Chronic pain syndrome: Secondary | ICD-10-CM | POA: Diagnosis not present

## 2015-03-16 DIAGNOSIS — I2510 Atherosclerotic heart disease of native coronary artery without angina pectoris: Secondary | ICD-10-CM | POA: Diagnosis not present

## 2015-03-16 DIAGNOSIS — M1990 Unspecified osteoarthritis, unspecified site: Secondary | ICD-10-CM | POA: Diagnosis not present

## 2015-03-16 DIAGNOSIS — Z79899 Other long term (current) drug therapy: Secondary | ICD-10-CM

## 2015-03-16 DIAGNOSIS — M48062 Spinal stenosis, lumbar region with neurogenic claudication: Secondary | ICD-10-CM

## 2015-03-16 DIAGNOSIS — K5909 Other constipation: Secondary | ICD-10-CM

## 2015-03-16 DIAGNOSIS — J449 Chronic obstructive pulmonary disease, unspecified: Secondary | ICD-10-CM | POA: Diagnosis not present

## 2015-03-16 DIAGNOSIS — E780 Pure hypercholesterolemia: Secondary | ICD-10-CM | POA: Diagnosis not present

## 2015-03-16 DIAGNOSIS — M4806 Spinal stenosis, lumbar region: Secondary | ICD-10-CM | POA: Diagnosis not present

## 2015-03-16 MED ORDER — OXYCODONE HCL 10 MG PO TABS: 10.0000 mg | ORAL_TABLET | Freq: Three times a day (TID) | ORAL | Status: DC

## 2015-03-16 MED ORDER — BISACODYL 5 MG PO TBEC: 5.0000 mg | DELAYED_RELEASE_TABLET | Freq: Every day | ORAL | Status: DC | PRN

## 2015-03-16 NOTE — Progress Notes (Signed)
Subjective:    Patient ID: Tim [name], male    DOB: [birth year], 74 y.o.   MRN: [id number]  HPI: Tim [name] is a 74 year old male who returns for follow up for chronic pain and medication refill. He says his pain is located in his lower back and left foot.He rates his pain 2. His current exercise regime is walking daily for 5-10 minutes daily. He agrees to have Spinal Cord Stimulation referral being placed to Dr. [name] at [facility].   Pain Inventory Average Pain 9 Pain Right Now 2 My pain is sharp, dull, stabbing and aching  In the last 24 hours, has pain interfered with the following? General activity 8 Relation with others 8 Enjoyment of life 3 What TIME of day is your pain at its worst? evening, night Sleep (in general) Poor  Pain is worse with: walking, bending, standing and some activites Pain improves with: rest, pacing activities and medication Relief from Meds: 7  Mobility walk without assistance use a cane how many minutes can you walk? 5 ability to climb steps?  yes do you drive?  yes  Function not employed: date last employed 2002 retired I need assistance with the following:  meal prep, household duties and shopping  Neuro/Psych weakness numbness tremor trouble walking  Prior Studies Any changes since last visit?  no  Physicians involved in your care Any changes since last visit?  no   Family History  Problem Relation Age of Onset  . Hypertension Mother   . Coronary artery disease Mother   . Heart attack Mother   . Pulmonary fibrosis Father     asbestosis  . Neuropathy Mother    Social History   Social History  . Marital Status: Married    Spouse Name: [name]  . Number of Children: 0  . Years of Education: 20   Occupational History  . engineering     retired   Social History Main Topics  . Smoking status: Never Smoker   . Smokeless tobacco: Never Used  . Alcohol Use: No  . Drug Use: No  . Sexual Activity: Not  Currently   Other Topics Concern  . None   Social History Narrative   HSG, [name] - BS, [name] - MS-engineering, 2 years on PhD - [name]. Married - '65 - 3yr/divorced; '76- 3 yrs/divorced; '92 . No children. 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 ACP/Living Will - Yes CPR; long-term Mechanical ventilation as long as he was able to cognate; ok for long term artificial nutrition. Precondition being able to cognate and not to have too much pain.    Past Surgical History  Procedure Laterality Date  . Hemorrhoid surgery    . Vitrectomy    . Intraocular lens insertion Bilateral   . Leg surgery Bilateral   . Knee surgery Right   . Traeculectomy Left     eye  . Shoulder arthroscopy  08/30/2011    Procedure: ARTHROSCOPY SHOULDER;  Surgeon: [name] MD;  [place]  Service: Orthopedics;  Laterality: Right;  Right Shoulder Arthroscopy, Debridement, and Decompression  . Tonsillectomy    . Colonoscopy    . Cholecystectomy    . 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  R big toe surg due to osteoarth  . Colonoscopy [name] 10/30/2012    Procedure: COLONOSCOPY;  Surgeon: CGatha Mayer MD;  Location: [facility];  Service: Endoscopy;  Laterality:  [name];  . Cardiac catheterization  1997    Non-obstructive disease  . Cardiac catheterization  12/24/2012    mild < 20% LCx, prox 30% RCA; LVEF 55-65% , moderate pulmonary HTN, moderate AS  . Eye surgery Bilateral cataract  . Amputation Left 04/11/2013    Procedure: AMPUTATION DIGIT Left 3rd toe;  Surgeon: [name], MD;  Location: [place];  Service: Orthopedics;  Laterality: Left;  Left 3rd toe amputation at [facility]  . Left and right heart catheterization with coronary angiogram [name] 12/24/2012    Procedure: LEFT AND RIGHT HEART CATHETERIZATION WITH CORONARY ANGIOGRAM;  Surgeon: [name], MD;  Location: [facility] CATH LAB;  Service: Cardiovascular;  Laterality: [name];   Past Medical History  Diagnosis Date    . HYPERCHOLESTEROLEMIA-PURE   . GOUT   . OSA on CPAP     8 cm H2O  . Essential hypertension, benign   . VENOUS INSUFFICIENCY   . ASTHMA   . COPD   . VENTRAL HERNIA   . OSTEOARTHRITIS   . CATARACT, HX OF   . PULMONARY HYPERTENSION, HX OF   . Hallux valgus (acquired)   . Obesity, unspecified   . Osteoarthrosis, unspecified whether generalized or localized, unspecified site   . Unspecified glaucoma   . Moderate aortic stenosis     Per 12/2012 echo- (VTI 1.08 cm^2, Vmax 1.11 cm ^2), mild LA dilatation  . Paroxysmal atrial fibrillation 2003    Off anticoag for a while due to falls.  Then apixaban started 12/2014.  . Lumbosacral neuritis   . Unspecified hereditary and idiopathic peripheral neuropathy approx age 89    bilat LE's, ? left arm, too.  Feet became progressively numb + left foot pain intermittently.  . Lumbosacral spondylosis   . CAD (coronary artery disease)     Nonobstructive by 12/2012 cath  . PUD (peptic ulcer disease)   . BPH (benign prostatic hypertrophy)   . HH (hiatus hernia)   . Glaucoma   . GERD (gastroesophageal reflux disease)   . Personal history of colonic adenoma 10/30/2012  . Shortness of breath     with exertion  . Peripheral neuropathy     Heredetary; with chronic neuropathic pain  . Normal memory function 08/2014    Neuropsychological testing ([facility]): no cognitive impairment or sign of neurodegenerative disorder.  Likely has adjustment d/o with mixed anxiety/depressed features and may benefit from low dose SNRI.    . IFG (impaired fasting glucose)     HbA1c's consistently <5.8%   BP 133/62 mmHg  Pulse 80  SpO2 96%  Opioid Risk Score:   Fall Risk Score:  `1  Depression screen PHQ 2/9  Depression screen [facility] 2/9 03/16/2015 02/16/2015 09/16/2014  Decreased Interest 0 0 1  Down, Depressed, Hopeless 0 0 0  PHQ - 2 Score 0 0 1  Altered sleeping - - 2  Tired, decreased energy - - 2  Change in appetite - - 1  Feeling bad or  failure about yourself  - - 0  Trouble concentrating - - 0  Moving slowly or fidgety/restless - - 0  Suicidal thoughts - - 0  PHQ-9 Score - - 6     Review of Systems  Constitutional: Positive for fever and chills.       Night sweats  Respiratory: Positive for apnea and shortness of breath.   Gastrointestinal: Positive for constipation.  All other systems reviewed and are negative.      Objective:   Physical Exam  Constitutional: He is oriented to person, place, and time. He appears well-developed and well-nourished.  HENT:  Head: Normocephalic and atraumatic.  Neck: Normal range of motion. Neck supple.  Cardiovascular: Normal rate and regular rhythm.   Pulmonary/Chest: Effort normal and breath sounds normal.  Musculoskeletal:  Normal Muscle Bulk and Muscle Testing Reveals: Upper Extremities: Right: Decreased ROM 45 Degrees and Muscle Strength 5/5 Left: Full ROM and Muscle Strength 5/5 Back without spinal or (paraspinal tenderness Lower Extremities: Full ROM and Muscle Strength 5/5 Arises from chair slowly using straight cane for support Narrow Based Gait  Neurological: He is alert and oriented to person, place, and time.  Skin: Skin is warm and dry.  Psychiatric: He has a normal mood and affect.  Nursing note and vitals reviewed.         Assessment & Plan:  1. 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  Refilled:  Oxycodone 10 mg ---use one every 8 hours as needed for pain.#90. Continue with Gabapentin.  RX: Referral for Spinal Cord Stimulation. Dr. [facility] 2. DDD lumbar spine with LBP: Neurology Following. Continue with activity and heat therapy.   20 minutes of face to face patient care time was spent during this visit. All questions were encouraged and answered.   F/U in 1 month

## 2015-03-22 ENCOUNTER — Encounter: Payer: Self-pay | Admitting: Family Medicine

## 2015-03-22 ENCOUNTER — Ambulatory Visit (INDEPENDENT_AMBULATORY_CARE_PROVIDER_SITE_OTHER): Payer: Medicare Other | Admitting: Family Medicine

## 2015-03-22 ENCOUNTER — Encounter: Payer: Medicare Other | Admitting: Family Medicine

## 2015-03-22 VITALS — BP 138/84 | HR 62 | Temp 98.1°F | Resp 16 | Wt 363.0 lb

## 2015-03-22 DIAGNOSIS — R7301 Impaired fasting glucose: Secondary | ICD-10-CM

## 2015-03-22 DIAGNOSIS — G3184 Mild cognitive impairment, so stated: Secondary | ICD-10-CM

## 2015-03-22 DIAGNOSIS — I10 Essential (primary) hypertension: Secondary | ICD-10-CM | POA: Diagnosis not present

## 2015-03-22 DIAGNOSIS — Z23 Encounter for immunization: Secondary | ICD-10-CM

## 2015-03-22 LAB — POCT GLYCOSYLATED HEMOGLOBIN (HGB A1C): HEMOGLOBIN A1C: 6.1

## 2015-03-22 MED ORDER — METFORMIN HCL 500 MG PO TABS
500.0000 mg | ORAL_TABLET | Freq: Two times a day (BID) | ORAL | Status: DC
Start: 2015-03-22 — End: 2015-09-30

## 2015-03-22 NOTE — Progress Notes (Signed)
OFFICE VISIT  03/22/2015   CC:  Chief Complaint  Patient presents with  . Follow-up   HPI:    Patient is a 74 y.o. Caucasian male who presents for 4 mo f/u HTN, IFG, and mild memory/cognitive impairment. He is eating less carbs since last visit. Home bp average 140/74 last 1 week and this is same as he gets when he checks it more sporadically.  HR avg 68.  This is despite being in quite a bit of pain in feet due to his PN.  He feels like his memory and cognition is the same as when I last saw him 4 mo ago.  He had no trouble weening off of the cymbalta we had him on at that time.  ROS: no fevers, no cough, no URI sx's, no melena or hematochezia    Past Medical History  Diagnosis Date  . HYPERCHOLESTEROLEMIA-PURE   . GOUT   . OSA on CPAP     8 cm H2O  . Essential hypertension, benign   . VENOUS INSUFFICIENCY   . ASTHMA   . COPD   . VENTRAL HERNIA   . OSTEOARTHRITIS   . CATARACT, HX OF   . PULMONARY HYPERTENSION, HX OF   . Hallux valgus (acquired)   . Obesity, unspecified   . Osteoarthrosis, unspecified whether generalized or localized, unspecified site   . Unspecified glaucoma   . Moderate aortic stenosis     Per 12/2012 echo- (VTI 1.08 cm^2, Vmax 1.11 cm ^2), mild LA dilatation  . Paroxysmal atrial fibrillation 2003    Off anticoag for a while due to falls.  Then apixaban started 12/2014.  . Lumbosacral neuritis   . Unspecified hereditary and idiopathic peripheral neuropathy approx age 74    bilat LE's, ? left arm, too.  Feet became progressively numb + left foot pain intermittently.  . Lumbosacral spondylosis   . CAD (coronary artery disease)     Nonobstructive by 12/2012 cath  . PUD (peptic ulcer disease)   . BPH (benign prostatic hypertrophy)   . HH (hiatus hernia)   . Glaucoma   . GERD (gastroesophageal reflux disease)   . Personal history of colonic adenoma 10/30/2012  . Shortness of breath     with exertion  . Peripheral neuropathy     Heredetary; with  chronic neuropathic pain (Dr. Ella Bodo)  . Normal memory function 08/2014    Neuropsychological testing ([facility]): no cognitive impairment or sign of neurodegenerative disorder.  Likely has adjustment d/o with mixed anxiety/depressed features and may benefit from low dose SNRI.    . IFG (impaired fasting glucose)     HbA1c's consistently <5.8%    Past Surgical History  Procedure Laterality Date  . Hemorrhoid surgery    . Vitrectomy    . Intraocular lens insertion Bilateral   . Leg surgery Bilateral   . Knee surgery Right   . Traeculectomy Left     eye  . Shoulder arthroscopy  08/30/2011    Procedure: ARTHROSCOPY SHOULDER;  Surgeon: [name], MD;  Location: [place];  Service: Orthopedics;  Laterality: Right;  Right Shoulder Arthroscopy, Debridement, and Decompression  . Tonsillectomy    . Colonoscopy    . Cholecystectomy    . 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  R big toe surg due to osteoarth  . Colonoscopy [name] 10/30/2012    Procedure: COLONOSCOPY;  Surgeon: [name], MD;  Location: [facility];  Service: Endoscopy;  Laterality: [name];  .  Cardiac catheterization  1997    Non-obstructive disease  . Cardiac catheterization  12/24/2012    mild < 20% LCx, prox 30% RCA; LVEF 55-65% , moderate pulmonary HTN, moderate AS  . Eye surgery Bilateral cataract  . Amputation Left 04/11/2013    Procedure: AMPUTATION DIGIT Left 3rd toe;  Surgeon: [name], MD;  Location: [place];  Service: Orthopedics;  Laterality: Left;  Left 3rd toe amputation at [facility]  . Left and right heart catheterization with coronary angiogram [name] 12/24/2012    Procedure: LEFT AND RIGHT HEART CATHETERIZATION WITH CORONARY ANGIOGRAM;  Surgeon: [name], MD;  Location[facility] CATH LAB;  Service: Cardiovascular;  Laterality: [name];  . Transthoracic echocardiogram  01/2015    No signif change in aortic stenosis (moderate).  Severe LVH w/small LV cavity, EF 60-65%, grade I diast dysfxn.     Outpatient Prescriptions Prior to Visit  Medication Sig Dispense Refill  . albuterol (PROVENTIL HFA;VENTOLIN HFA) 108 (90 BASE) MCG/ACT inhaler Inhale 2 puffs into the lungs 4 (four) times daily as needed for wheezing or shortness of breath.    . allopurinol (ZYLOPRIM) 300 MG tablet Take 1 tablet (300 mg total) by mouth daily. take 1 tablet by mouth with meals 30 tablet 6  . apixaban (ELIQUIS) 5 MG TABS tablet Take 1 tablet (5 mg total) by mouth 2 (two) times daily. 90 tablet 2  . B Complex-C (B-COMPLEX WITH VITAMIN C) tablet Take 1 tablet by mouth daily.    . bisacodyl (BISACODYL) 5 MG EC tablet Take 1 tablet (5 mg total) by mouth daily as needed for moderate constipation. 30 tablet 0  . clotrimazole-betamethasone (LOTRISONE) cream Apply 1 application topically 2 (two) times daily. 45 g 6  . CRESTOR 40 MG tablet take 1 tablet by mouth once daily 90 tablet 0  . ezetimibe (ZETIA) 10 MG tablet Take 1 tablet (10 mg total) by mouth daily. 30 tablet 4  . finasteride (PROSCAR) 5 MG tablet take 1 tablet by mouth once daily 90 tablet 1  . fluticasone (FLONASE) 50 MCG/ACT nasal spray Place 2 sprays into both nostrils as needed for allergies or rhinitis.    . Fluticasone-Salmeterol (ADVAIR) 250-50 MCG/DOSE AEPB Inhale 1 puff into the lungs daily as needed (for nasal congestion).     . gabapentin (NEURONTIN) 600 MG tablet take 1 tablet by mouth four times a day AFTER MEALS AND AT BEDTIME 120 tablet 1  . metoprolol succinate (TOPROL XL) 25 MG 24 hr tablet Take 1 tablet (25 mg total) by mouth daily. 90 tablet 3  . metoprolol succinate (TOPROL-XL) 50 MG 24 hr tablet Take with or immediately following a meal. 90 tablet 3  . Multiple Vitamin (MULTIVITAMIN) capsule Take 1 capsule by mouth daily.    . Niacin CR 1000 MG TBCR Take 1 tablet (1,000 mg total) by mouth 2 (two) times daily. 180 each 3  . nystatin (MYCOSTATIN) 100000 UNIT/ML suspension Take 5 mLs (500,000 Units total) by mouth 4 (four) times daily.  180 mL 0  . omeprazole (PRILOSEC) 10 MG capsule Take 1 capsule (10 mg total) by mouth daily. 30 capsule 6  . Oxycodone HCl 10 MG TABS Take 1 tablet (10 mg total) by mouth 3 (three) times daily. 90 tablet 0  . potassium chloride SA (K-DUR,KLOR-CON) 20 MEQ tablet Take 1 tablet (20 mEq total) by mouth daily. 90 tablet 1  . Probiotic Product (PROBIOTIC DAILY PO) Take by mouth as needed.     . rosuvastatin (CRESTOR)  40 MG tablet Take 40 mg by mouth daily.    [name] TEMAZEPAM PO Take 15 mg by mouth as needed.     . traMADol-acetaminophen (ULTRACET) 37.5-325 MG per tablet take 1 tablet by mouth every 6 hours if needed for pain 120 tablet 1  . VITAMIN D, CHOLECALCIFEROL, PO Take by mouth.     No facility-administered medications prior to visit.    Allergies  Allergen Reactions  . Brimonidine Tartrate     ALPHAGAN-Shortness of breath  . Brinzolamide     AZOPT- Shortness of breath  . Celecoxib     CELLBREX-confusion  . Codeine     Childhood reaction  . Colchicine     diarrhea  . Diltiazem      leg swelling  . Latanoprost     XALATAN- Shortness of breath  . Rofecoxib      VIOXX- leg swelling  . Timolol Maleate     TIMOPTIC- Aggravated asthma  . Vancomycin     hives/blisters    ROS As per HPI  PE: Blood pressure 138/84, pulse 62, temperature 98.1 F (36.7 C), temperature source Oral, resp. rate 16, weight 363 lb (164.656 kg), SpO2 96 %. repeat bp today 138/84 Gen: Alert, well appearing.  Patient is oriented to person, place, time, and situation. AFFECT: pleasant, lucid thought and speech. No further exam today.  LABS:  Lab Results  Component Value Date   HGBA1C 6.1 03/22/2015     Chemistry      Component Value Date/Time   NA 139 12/28/2014 1129   K 4.6 12/28/2014 1129   CL 105 12/28/2014 1129   CO2 28 12/28/2014 1129   BUN 18 12/28/2014 1129   CREATININE 0.79 12/28/2014 1129   CREATININE 0.75 11/04/2014 1527      Component Value Date/Time   CALCIUM 9.5 12/28/2014 1129    ALKPHOS 66 10/07/2014 1056   AST 15 10/07/2014 1056   ALT 13 10/07/2014 1056   BILITOT 0.5 10/07/2014 1056     Lab Results  Component Value Date   CHOL 115 10/07/2014   HDL 61.10 10/07/2014   LDLCALC 39 10/07/2014   TRIG 75.0 10/07/2014   CHOLHDL 2 10/07/2014   Lab Results  Component Value Date   WBC 11.0* 12/28/2014   HGB 12.7* 12/28/2014   HCT 38.8* 12/28/2014   MCV 87.0 12/28/2014   PLT 179 12/28/2014    IMPRESSION AND PLAN:  1) IFG: HbA1c POCT today was 6.1%, up from 5.7% 4 mo ago. Discussed options with pt: he declines nutritionist, saying he has done this before and he is aware of what he needs to do but just can't do it. He is in favor of starting metformin 524m bid so i rx'd this today (his GFR is in the 80s).  2) HTN;The current medical regimen is effective;  continue present plan and medications.  3) Mild cognitive impairment: this is stable.  No sign of depression.   Did well with ween off of cymbalta.  Continue to monitor for decline.  No meds at this time.  4) Prev health care: High dose flu vaccine IM today.  An After Visit Summary was printed and given to the patient.  FOLLOW UP: Return in about 4 months (around 07/22/2015) for routine chronic illness f/u (30 min).

## 2015-03-22 NOTE — Progress Notes (Deleted)
OFFICE VISIT  03/22/2015   CC: No chief complaint on file.  HPI:    Patient is a 74 y.o. Caucasian male who presents for 4 mo f/u HTN, IFG, hyperlipidemia. His Toprol was increased to 25m qd about 3 months ago by his cardiologist. He was also started on apixaban 549mbid at that time for his PAF (ASA was d/c'd).  Past Medical History  Diagnosis Date  . HYPERCHOLESTEROLEMIA-PURE   . GOUT   . OSA on CPAP     8 cm H2O  . Essential hypertension, benign   . VENOUS INSUFFICIENCY   . ASTHMA   . COPD   . VENTRAL HERNIA   . OSTEOARTHRITIS   . CATARACT, HX OF   . PULMONARY HYPERTENSION, HX OF   . Hallux valgus (acquired)   . Obesity, unspecified   . Osteoarthrosis, unspecified whether generalized or localized, unspecified site   . Unspecified glaucoma   . Moderate aortic stenosis     Per 12/2012 echo- (VTI 1.08 cm^2, Vmax 1.11 cm ^2), mild LA dilatation  . Paroxysmal atrial fibrillation 2003    Off anticoag for a while due to falls.  Then apixaban started 12/2014.  . Lumbosacral neuritis   . Unspecified hereditary and idiopathic peripheral neuropathy approx age 74  bilat LE's, ? left arm, too.  Feet became progressively numb + left foot pain intermittently.  . Lumbosacral spondylosis   . CAD (coronary artery disease)     Nonobstructive by 12/2012 cath  . PUD (peptic ulcer disease)   . BPH (benign prostatic hypertrophy)   . HH (hiatus hernia)   . Glaucoma   . GERD (gastroesophageal reflux disease)   . Personal history of colonic adenoma 10/30/2012  . Shortness of breath     with exertion  . Peripheral neuropathy     Heredetary; with chronic neuropathic pain  . Normal memory function 08/2014    Neuropsychological testing ([facility]): no cognitive impairment or sign of neurodegenerative disorder.  Likely has adjustment d/o with mixed anxiety/depressed features and may benefit from low dose SNRI.    . IFG (impaired fasting glucose)     HbA1c's consistently <5.8%     Past Surgical History  Procedure Laterality Date  . Hemorrhoid surgery    . Vitrectomy    . Intraocular lens insertion Bilateral   . Leg surgery Bilateral   . Knee surgery Right   . Traeculectomy Left     eye  . Shoulder arthroscopy  08/30/2011    Procedure: ARTHROSCOPY SHOULDER;  Surgeon: MaNewt MinionMD;  [facility] [facility] Service: [place]ics;  Laterality: Right;  Right Shoulder Arthroscopy, Debridement, and Decompression  . Tonsillectomy    . Colonoscopy    . Cholecystectomy    . 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  R big toe surg due to osteoarth  . Colonoscopy [name] 10/30/2012    Procedure: COLONOSCOPY;  Surgeon: CaGatha MayerMD;  Location: [facility];  Service: Endoscopy;  Laterality: [name];  . Cardiac catheterization  1997    Non-obstructive disease  . Cardiac catheterization  12/24/2012    mild < 20% LCx, prox 30% RCA; LVEF 55-65% , moderate pulmonary HTN, moderate AS  . Eye surgery Bilateral cataract  . Amputation Left 04/11/2013    Procedure: AMPUTATION DIGIT Left 3rd toe;  Surgeon: MaNewt MinionMD;  [facility]: [facility];  Laterality: Left;  Left 3rd toe amputation at [facility]  . Left and  right heart catheterization with coronary angiogram [name] 12/24/2012    Procedure: LEFT AND RIGHT HEART CATHETERIZATION WITH CORONARY ANGIOGRAM;  Surgeon: [name], MD;  Location: [facility] CATH LAB;  Service: Cardiovascular;  Laterality: [name];    Outpatient Prescriptions Prior to Visit  Medication Sig Dispense Refill  . albuterol (PROVENTIL HFA;VENTOLIN HFA) 108 (90 BASE) MCG/ACT inhaler Inhale 2 puffs into the lungs 4 (four) times daily as needed for wheezing or shortness of breath.    . allopurinol (ZYLOPRIM) 300 MG tablet Take 1 tablet (300 mg total) by mouth daily. take 1 tablet by mouth with meals 30 tablet 6  . apixaban (ELIQUIS) 5 MG TABS tablet Take 1 tablet (5 mg total) by mouth 2 (two) times daily. 90 tablet 2  . B Complex-C (B-COMPLEX WITH VITAMIN C)  tablet Take 1 tablet by mouth daily.    . bisacodyl (BISACODYL) 5 MG EC tablet Take 1 tablet (5 mg total) by mouth daily as needed for moderate constipation. 30 tablet 0  . clotrimazole-betamethasone (LOTRISONE) cream Apply 1 application topically 2 (two) times daily. 45 g 6  . CRESTOR 40 MG tablet take 1 tablet by mouth once daily 90 tablet 0  . ezetimibe (ZETIA) 10 MG tablet Take 1 tablet (10 mg total) by mouth daily. 30 tablet 4  . finasteride (PROSCAR) 5 MG tablet take 1 tablet by mouth once daily 90 tablet 1  . fluticasone (FLONASE) 50 MCG/ACT nasal spray Place 2 sprays into both nostrils as needed for allergies or rhinitis.    . Fluticasone-Salmeterol (ADVAIR) 250-50 MCG/DOSE AEPB Inhale 1 puff into the lungs daily as needed (for nasal congestion).     . gabapentin (NEURONTIN) 600 MG tablet take 1 tablet by mouth four times a day AFTER MEALS AND AT BEDTIME 120 tablet 1  . metoprolol succinate (TOPROL XL) 25 MG 24 hr tablet Take 1 tablet (25 mg total) by mouth daily. 90 tablet 3  . metoprolol succinate (TOPROL-XL) 50 MG 24 hr tablet Take with or immediately following a meal. 90 tablet 3  . Multiple Vitamin (MULTIVITAMIN) capsule Take 1 capsule by mouth daily.    . Niacin CR 1000 MG TBCR Take 1 tablet (1,000 mg total) by mouth 2 (two) times daily. 180 each 3  . nystatin (MYCOSTATIN) 100000 UNIT/ML suspension Take 5 mLs (500,000 Units total) by mouth 4 (four) times daily. 180 mL 0  . omeprazole (PRILOSEC) 10 MG capsule Take 1 capsule (10 mg total) by mouth daily. 30 capsule 6  . Oxycodone HCl 10 MG TABS Take 1 tablet (10 mg total) by mouth 3 (three) times daily. 90 tablet 0  . potassium chloride SA (K-DUR,KLOR-CON) 20 MEQ tablet Take 1 tablet (20 mEq total) by mouth daily. 90 tablet 1  . Probiotic Product (PROBIOTIC DAILY PO) Take by mouth as needed.     . rosuvastatin (CRESTOR) 40 MG tablet Take 40 mg by mouth daily.    [name] TEMAZEPAM PO Take 15 mg by mouth as needed.     .  traMADol-acetaminophen (ULTRACET) 37.5-325 MG per tablet take 1 tablet by mouth every 6 hours if needed for pain 120 tablet 1  . VITAMIN D, CHOLECALCIFEROL, PO Take by mouth.     No facility-administered medications prior to visit.    Allergies  Allergen Reactions  . Brimonidine Tartrate     ALPHAGAN-Shortness of breath  . Brinzolamide     AZOPT- Shortness of breath  . Celecoxib     CELLBREX-confusion  . Codeine  Childhood reaction  . Colchicine     diarrhea  . Diltiazem      leg swelling  . Latanoprost     XALATAN- Shortness of breath  . Rofecoxib      VIOXX- leg swelling  . Timolol Maleate     TIMOPTIC- Aggravated asthma  . Vancomycin     hives/blisters    ROS As per HPI  PE: There were no vitals taken for this visit. ***  LABS:  Lab Results  Component Value Date   HGBA1C 5.7 11/04/2014   Lab Results  Component Value Date   CHOL 115 10/07/2014   HDL 61.10 10/07/2014   LDLCALC 39 10/07/2014   TRIG 75.0 10/07/2014   CHOLHDL 2 10/07/2014     Chemistry      Component Value Date/Time   NA 139 12/28/2014 1129   K 4.6 12/28/2014 1129   CL 105 12/28/2014 1129   CO2 28 12/28/2014 1129   BUN 18 12/28/2014 1129   CREATININE 0.79 12/28/2014 1129   CREATININE 0.75 11/04/2014 1527      Component Value Date/Time   CALCIUM 9.5 12/28/2014 1129   ALKPHOS 66 10/07/2014 1056   AST 15 10/07/2014 1056   ALT 13 10/07/2014 1056   BILITOT 0.5 10/07/2014 1056     Lab Results  Component Value Date   TSH 2.36 01/28/2014   Lab Results  Component Value Date   WBC 11.0* 12/28/2014   HGB 12.7* 12/28/2014   HCT 38.8* 12/28/2014   MCV 87.0 12/28/2014   PLT 179 12/28/2014     IMPRESSION AND PLAN:  No problem-specific assessment & plan notes found for this encounter.   FOLLOW UP: No Follow-up on file.

## 2015-03-22 NOTE — Progress Notes (Signed)
Pre visit review using our clinic review tool, if applicable. No additional management support is needed unless otherwise documented below in the visit note. 

## 2015-04-06 ENCOUNTER — Other Ambulatory Visit: Payer: Self-pay | Admitting: *Deleted

## 2015-04-06 ENCOUNTER — Other Ambulatory Visit: Payer: Self-pay | Admitting: Physical Medicine & Rehabilitation

## 2015-04-06 DIAGNOSIS — I4891 Unspecified atrial fibrillation: Secondary | ICD-10-CM

## 2015-04-06 MED ORDER — APIXABAN 5 MG PO TABS: 5.0000 mg | ORAL_TABLET | Freq: Two times a day (BID) | ORAL | Status: DC

## 2015-04-06 MED ORDER — POTASSIUM CHLORIDE CRYS ER 20 MEQ PO TBCR
20.0000 meq | EXTENDED_RELEASE_TABLET | Freq: Every day | ORAL | Status: DC
Start: 2015-04-06 — End: 2015-10-10

## 2015-04-06 MED ORDER — NIACIN ER 1000 MG PO TBCR: 1000.0000 mg | EXTENDED_RELEASE_TABLET | Freq: Two times a day (BID) | ORAL | Status: DC

## 2015-04-06 NOTE — Telephone Encounter (Signed)
RF request for nystatin LOV: 03/22/15 Next ov: 07/22/15 Last written: 12/28/14 #180 w/ 0RF Please advise. Thanks.

## 2015-04-06 NOTE — Telephone Encounter (Signed)
RF request for potassium LOV: 03/22/15 Next ov: 07/22/15 Last written: 06/08/14 #90 w/ 1RF

## 2015-04-13 ENCOUNTER — Encounter: Payer: Self-pay | Admitting: Registered Nurse

## 2015-04-13 ENCOUNTER — Encounter: Payer: Medicare Other | Attending: Registered Nurse | Admitting: Registered Nurse

## 2015-04-13 VITALS — BP 153/59 | HR 66

## 2015-04-13 DIAGNOSIS — M79672 Pain in left foot: Secondary | ICD-10-CM | POA: Diagnosis present

## 2015-04-13 DIAGNOSIS — M1990 Unspecified osteoarthritis, unspecified site: Secondary | ICD-10-CM | POA: Insufficient documentation

## 2015-04-13 DIAGNOSIS — Z5181 Encounter for therapeutic drug level monitoring: Secondary | ICD-10-CM | POA: Diagnosis not present

## 2015-04-13 DIAGNOSIS — I2510 Atherosclerotic heart disease of native coronary artery without angina pectoris: Secondary | ICD-10-CM | POA: Diagnosis not present

## 2015-04-13 DIAGNOSIS — G894 Chronic pain syndrome: Secondary | ICD-10-CM | POA: Diagnosis not present

## 2015-04-13 DIAGNOSIS — J449 Chronic obstructive pulmonary disease, unspecified: Secondary | ICD-10-CM | POA: Diagnosis not present

## 2015-04-13 DIAGNOSIS — M4806 Spinal stenosis, lumbar region: Secondary | ICD-10-CM | POA: Diagnosis not present

## 2015-04-13 DIAGNOSIS — M48062 Spinal stenosis, lumbar region with neurogenic claudication: Secondary | ICD-10-CM

## 2015-04-13 DIAGNOSIS — E7800 Pure hypercholesterolemia, unspecified: Secondary | ICD-10-CM | POA: Insufficient documentation

## 2015-04-13 DIAGNOSIS — K219 Gastro-esophageal reflux disease without esophagitis: Secondary | ICD-10-CM | POA: Diagnosis not present

## 2015-04-13 DIAGNOSIS — M109 Gout, unspecified: Secondary | ICD-10-CM | POA: Diagnosis not present

## 2015-04-13 DIAGNOSIS — G609 Hereditary and idiopathic neuropathy, unspecified: Secondary | ICD-10-CM | POA: Diagnosis not present

## 2015-04-13 DIAGNOSIS — Z79899 Other long term (current) drug therapy: Secondary | ICD-10-CM

## 2015-04-13 MED ORDER — OXYCODONE HCL 10 MG PO TABS: 10.0000 mg | ORAL_TABLET | Freq: Three times a day (TID) | ORAL | Status: DC

## 2015-04-13 MED ORDER — TRAMADOL-ACETAMINOPHEN 37.5-325 MG PO TABS: ORAL_TABLET | ORAL | Status: DC

## 2015-04-13 NOTE — Progress Notes (Signed)
Subjective:    Patient ID: Tim [name], male    DOB: [birth year], 74 y.o.   MRN: [id number]  HPI: Mr. BRYTON ROMAGNOLI is a 74 year old male who returns for follow up for chronic pain and medication refill. He says his pain is located in his lower back radiating into his right lower extremity anteriorly.He rates his pain 3. His current exercise regime is walking daily for 5-10 minutes daily. Also states he's doing more chores in the home since his wife had surgery. He asked for Tramadol 90 day supply this was given, other Tramadol prescription discarded.  Pain Inventory Average Pain 7 Pain Right Now 3 My pain is intermittent, sharp, dull, stabbing and aching  In the last 24 hours, has pain interfered with the following? General activity 8 Relation with others 5 Enjoyment of life 8 What TIME of day is your pain at its worst? evening and night Sleep (in general) Poor  Pain is worse with: walking, bending, standing and some activites Pain improves with: rest, pacing activities, medication, TENS and injections Relief from Meds: 6  Mobility walk without assistance use a cane how many minutes can you walk? 5 ability to climb steps?  no do you drive?  yes  Function retired I need assistance with the following:  household duties and shopping  Neuro/Psych weakness numbness tremor tingling trouble walking  Prior Studies Any changes since last visit?  no  Physicians involved in your care Any changes since last visit?  no   Family History  Problem Relation Age of Onset  . Hypertension Mother   . Coronary artery disease Mother   . Heart attack Mother   . Pulmonary fibrosis Father     asbestosis  . Neuropathy Mother    Social History   Social History  . Marital Status: Married    Spouse Name: [name]  . Number of Children: 0  . Years of Education: 20   Occupational History  . engineering     retired   Social History Main Topics  . Smoking status: Never Smoker     . Smokeless tobacco: Never Used  . Alcohol Use: No  . Drug Use: No  . Sexual Activity: Not Currently   Other Topics Concern  . None   Social History Narrative   HSG, [name] - BS, [name] - MS-engineering, 2 years on PhD - [name]. Married - '65 - 15yr/divorced; '76- 3 yrs/divorced; '92 . No children. 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 ACP/Living Will - Yes CPR; long-term Mechanical ventilation as long as he was able to cognate; ok for long term artificial nutrition. Precondition being able to cognate and not to have too much pain.    Past Surgical History  Procedure Laterality Date  . Hemorrhoid surgery    . Vitrectomy    . Intraocular lens insertion Bilateral   . Leg surgery Bilateral   . Knee surgery Right   . Traeculectomy Left     eye  . Shoulder arthroscopy  08/30/2011    Procedure: ARTHROSCOPY SHOULDER;  Surgeon: [name] MD;  Location[place] [facility]  Service: Orthopedics;  Laterality: Right;  Right Shoulder Arthroscopy, Debridement, and Decompression  . Tonsillectomy    . Colonoscopy    . Cholecystectomy    . 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  R big toe surg due to osteoarth  . Colonoscopy [name] 10/30/2012    Procedure: COLONOSCOPY;  Surgeon:  [name], MD;  Location: [facility];  Service: Endoscopy;  Laterality: [name];  . Cardiac catheterization  1997    Non-obstructive disease  . Cardiac catheterization  12/24/2012    mild < 20% LCx, prox 30% RCA; LVEF 55-65% , moderate pulmonary HTN, moderate AS  . Eye surgery Bilateral cataract  . Amputation Left 04/11/2013    Procedure: AMPUTATION DIGIT Left 3rd toe;  Surgeon: [name], MD;  Location: [place];  Service: Orthopedics;  Laterality: Left;  Left 3rd toe amputation at [facility]  . Left and right heart catheterization with coronary angiogram [name] 12/24/2012    Procedure: LEFT AND RIGHT HEART CATHETERIZATION WITH CORONARY ANGIOGRAM;  Surgeon: [name], MD;  Location: [facility]  CATH LAB;  Service: Cardiovascular;  Laterality: [name];  . Transthoracic echocardiogram  01/2015    No signif change in aortic stenosis (moderate).  Severe LVH w/small LV cavity, EF 60-65%, grade I diast dysfxn.   Past Medical History  Diagnosis Date  . HYPERCHOLESTEROLEMIA-PURE   . GOUT   . OSA on CPAP     8 cm H2O  . Essential hypertension, benign   . VENOUS INSUFFICIENCY   . ASTHMA   . COPD   . VENTRAL HERNIA   . OSTEOARTHRITIS   . CATARACT, HX OF   . PULMONARY HYPERTENSION, HX OF   . Hallux valgus (acquired)   . Obesity, unspecified   . Osteoarthrosis, unspecified whether generalized or localized, unspecified site   . Unspecified glaucoma   . Moderate aortic stenosis     Per 12/2012 echo- (VTI 1.08 cm^2, Vmax 1.11 cm ^2), mild LA dilatation  . Paroxysmal atrial fibrillation ([place]) 2003    Off anticoag for a while due to falls.  Then apixaban started 12/2014.  . Lumbosacral neuritis   . Unspecified hereditary and idiopathic peripheral neuropathy approx age 30    bilat LE's, ? left arm, too.  Feet became progressively numb + left foot pain intermittently.  . Lumbosacral spondylosis   . CAD (coronary artery disease)     Nonobstructive by 12/2012 cath  . PUD (peptic ulcer disease)   . BPH (benign prostatic hypertrophy)   . HH (hiatus hernia)   . Glaucoma   . GERD (gastroesophageal reflux disease)   . Personal history of colonic adenoma 10/30/2012  . Shortness of breath     with exertion  . Peripheral neuropathy ([place])     Heredetary; with chronic neuropathic pain (Dr. Ella Bodo)  . Normal memory function 08/2014    Neuropsychological testing ([facility]): no cognitive impairment or sign of neurodegenerative disorder.  Likely has adjustment d/o with mixed anxiety/depressed features and may benefit from low dose SNRI.    . IFG (impaired fasting glucose)     HbA1c jumped from 5.7% to 6.1% 03/2015---started metformin at that time.   BP 153/59 mmHg  Pulse 66  SpO2  98%  Opioid Risk Score:   Fall Risk Score:  `1  Depression screen PHQ 2/9  Depression screen [name] 2/9 03/16/2015 02/16/2015 09/16/2014  Decreased Interest 0 0 1  Down, Depressed, Hopeless 0 0 0  PHQ - 2 Score 0 0 1  Altered sleeping - - 2  Tired, decreased energy - - 2  Change in appetite - - 1  Feeling bad or failure about yourself  - - 0  Trouble concentrating - - 0  Moving slowly or fidgety/restless - - 0  Suicidal thoughts - - 0  PHQ-9 Score - - 6  Review of Systems  Constitutional: Positive for chills and diaphoresis.  Respiratory: Positive for apnea and shortness of breath.   Cardiovascular: Positive for leg swelling.  Gastrointestinal: Positive for nausea.  Musculoskeletal: Positive for gait problem.  Skin: Positive for rash.  Neurological: Positive for tremors, weakness and numbness.       Tingling  All other systems reviewed and are negative.      Objective:   Physical Exam  Constitutional: He is oriented to person, place, and time. He appears well-developed and well-nourished.  HENT:  Head: Normocephalic and atraumatic.  Neck: Normal range of motion. Neck supple.  Cardiovascular: Normal rate and regular rhythm.   Pulmonary/Chest: Effort normal and breath sounds normal.  Musculoskeletal:  Normal Muscle Bulk and Muscle Testing Reveals:  Upper Extremities: Right: Decreased ROM 90 Degrees and Muscle Strength 5/5 Left: Full ROM and Muscle Strength 5/5 Lumbar Paraspinal Tenderness: L-4- L-5 Mainly Right Side Lower Extremities: Full ROM and Muscle Strength 5/5 Arises from chair slowly using straight cane for support Narrow Based Gait  Neurological: He is alert and oriented to person, place, and time.  Skin: Skin is warm and dry.  Psychiatric: He has a normal mood and affect.  Nursing note and vitals reviewed.         Assessment & Plan:  1. 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  Refilled:  Oxycodone 10 mg ---use one every 8 hours as needed for pain.#90. Continue with Gabapentin.  2. DDD lumbar spine with LBP: Neurology Following. Continue with activity and heat therapy.   20 minutes of face to face patient care time was spent during this visit. All questions were encouraged and answered.   F/U in 1 month

## 2015-05-11 ENCOUNTER — Encounter: Payer: Self-pay | Admitting: Physical Medicine & Rehabilitation

## 2015-05-11 ENCOUNTER — Ambulatory Visit (HOSPITAL_BASED_OUTPATIENT_CLINIC_OR_DEPARTMENT_OTHER): Payer: Medicare Other | Admitting: Physical Medicine & Rehabilitation

## 2015-05-11 ENCOUNTER — Encounter: Payer: Medicare Other | Attending: Registered Nurse

## 2015-05-11 VITALS — BP 142/69 | HR 85

## 2015-05-11 DIAGNOSIS — G609 Hereditary and idiopathic neuropathy, unspecified: Secondary | ICD-10-CM | POA: Insufficient documentation

## 2015-05-11 DIAGNOSIS — K219 Gastro-esophageal reflux disease without esophagitis: Secondary | ICD-10-CM | POA: Diagnosis not present

## 2015-05-11 DIAGNOSIS — M4806 Spinal stenosis, lumbar region: Secondary | ICD-10-CM | POA: Diagnosis not present

## 2015-05-11 DIAGNOSIS — I2510 Atherosclerotic heart disease of native coronary artery without angina pectoris: Secondary | ICD-10-CM | POA: Insufficient documentation

## 2015-05-11 DIAGNOSIS — J449 Chronic obstructive pulmonary disease, unspecified: Secondary | ICD-10-CM | POA: Insufficient documentation

## 2015-05-11 DIAGNOSIS — M1990 Unspecified osteoarthritis, unspecified site: Secondary | ICD-10-CM | POA: Insufficient documentation

## 2015-05-11 DIAGNOSIS — E7800 Pure hypercholesterolemia, unspecified: Secondary | ICD-10-CM | POA: Insufficient documentation

## 2015-05-11 DIAGNOSIS — M79672 Pain in left foot: Secondary | ICD-10-CM | POA: Diagnosis present

## 2015-05-11 DIAGNOSIS — M109 Gout, unspecified: Secondary | ICD-10-CM | POA: Insufficient documentation

## 2015-05-11 DIAGNOSIS — M48061 Spinal stenosis, lumbar region without neurogenic claudication: Secondary | ICD-10-CM

## 2015-05-11 MED ORDER — OXYCODONE HCL 10 MG PO TABS: 10.0000 mg | ORAL_TABLET | Freq: Three times a day (TID) | ORAL | Status: DC

## 2015-05-11 NOTE — Progress Notes (Signed)
Subjective:    Patient ID: Tim [name], male    DOB: Aug 28, 1940, 74 y.o.   MRN: [id number]  HPI Patient has been seen by Dr. [name] to evaluate for spinal cord stimulator trial. He is awaiting psychology evaluation. Patient has been more active recently. His wife has had spine surgery and he is doing more around the house. He is having back pain as well as pain into his Left lower extremity, left foot, lateral aspect.  He has noted as he has become more active that his back pain is more of an issue. His wife was doing a lot of things for him which he is now forced to do. He has been increased to 3 times a day and oxycodone in August and still has periods during the day where his pain is uncontrolled.  He is still taking gabapentin 600 g 4 times per day. He has not had any problems with sedation   Pain Inventory Average Pain 7 Pain Right Now 8 My pain is intermittent, constant, sharp, dull, stabbing and aching  In the last 24 hours, has pain interfered with the following? General activity 8 Relation with others 9 Enjoyment of life 6 What TIME of day is your pain at its worst? night Sleep (in general) Poor  Pain is worse with: walking, bending, standing and some activites Pain improves with: rest, pacing activities, medication and TENS Relief from Meds: 6  Mobility walk with assistance use a cane use a walker how many minutes can you walk? 3 ability to climb steps?  yes do you drive?  yes  Function retired I need assistance with the following:  meal prep, household duties and shopping  Neuro/Psych weakness numbness tremor tingling trouble walking  Prior Studies Any changes since last visit?  no  Physicians involved in your care Any changes since last visit?  yes   Family History  Problem Relation Age of Onset  . Hypertension Mother   . Coronary artery disease Mother   . Heart attack Mother   . Pulmonary fibrosis Father     asbestosis  . Neuropathy  Mother    Social History   Social History  . Marital Status: Married    Spouse Name: [name]  . Number of Children: 0  . Years of Education: 20   Occupational History  . engineering     retired   Social History Main Topics  . Smoking status: Never Smoker   . Smokeless tobacco: Never Used  . Alcohol Use: No  . Drug Use: No  . Sexual Activity: Not Currently   Other Topics Concern  . None   Social History Narrative   HSG, [name] - BS, [name] - MS-engineering, 2 years on PhD - [name]. Married - '65 - 46yr/divorced; '76- 3 yrs/divorced; '92 . No children. 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 ACP/Living Will - Yes CPR; long-term Mechanical ventilation as long as he was able to cognate; ok for long term artificial nutrition. Precondition being able to cognate and not to have too much pain.    Past Surgical History  Procedure Laterality Date  . Hemorrhoid surgery    . Vitrectomy    . Intraocular lens insertion Bilateral   . Leg surgery Bilateral   . Knee surgery Right   . Traeculectomy Left     eye  . Shoulder arthroscopy  08/30/2011    Procedure: ARTHROSCOPY SHOULDER;  Surgeon: [name] MD;  [place] [facility]  Service: Orthopedics;  Laterality: Right;  Right Shoulder Arthroscopy, Debridement, and Decompression  . Tonsillectomy    . Colonoscopy    . Cholecystectomy    . 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  R big toe surg due to osteoarth  . Colonoscopy [name] 10/30/2012    Procedure: COLONOSCOPY;  Surgeon: [name], MD;  [facility];  Service: Endoscopy;  Laterality: [name];  . Cardiac catheterization  1997    Non-obstructive disease  . Cardiac catheterization  12/24/2012    mild < 20% LCx, prox 30% RCA; LVEF 55-65% , moderate pulmonary HTN, moderate AS  . Eye surgery Bilateral cataract  . Amputation Left 04/11/2013    Procedure: AMPUTATION DIGIT Left 3rd toe;  Surgeon: [name], MD;  Location: [place];   Service: Orthopedics;  Laterality: Left;  Left 3rd toe amputation at [facility]  . Left and right heart catheterization with coronary angiogram [name] 12/24/2012    Procedure: LEFT AND RIGHT HEART CATHETERIZATION WITH CORONARY ANGIOGRAM;  Surgeon: [name], MD;  Location: [facility] CATH LAB;  Service: Cardiovascular;  Laterality: [name];  . Transthoracic echocardiogram  01/2015    No signif change in aortic stenosis (moderate).  Severe LVH w/small LV cavity, EF 60-65%, grade I diast dysfxn.   Past Medical History  Diagnosis Date  . HYPERCHOLESTEROLEMIA-PURE   . GOUT   . OSA on CPAP     8 cm H2O  . Essential hypertension, benign   . VENOUS INSUFFICIENCY   . ASTHMA   . COPD   . VENTRAL HERNIA   . OSTEOARTHRITIS   . CATARACT, HX OF   . PULMONARY HYPERTENSION, HX OF   . Hallux valgus (acquired)   . Obesity, unspecified   . Osteoarthrosis, unspecified whether generalized or localized, unspecified site   . Unspecified glaucoma   . Moderate aortic stenosis     Per 12/2012 echo- (VTI 1.08 cm^2, Vmax 1.11 cm ^2), mild LA dilatation  . Paroxysmal atrial fibrillation ([place]) 2003    Off anticoag for a while due to falls.  Then apixaban started 12/2014.  . Lumbosacral neuritis   . Unspecified hereditary and idiopathic peripheral neuropathy approx age 52    bilat LE's, ? left arm, too.  Feet became progressively numb + left foot pain intermittently.  . Lumbosacral spondylosis   . CAD (coronary artery disease)     Nonobstructive by 12/2012 cath  . PUD (peptic ulcer disease)   . BPH (benign prostatic hypertrophy)   . HH (hiatus hernia)   . Glaucoma   . GERD (gastroesophageal reflux disease)   . Personal history of colonic adenoma 10/30/2012  . Shortness of breath     with exertion  . Peripheral neuropathy ([place])     Heredetary; with chronic neuropathic pain (Dr. Ella Bodo)  . Normal memory function 08/2014    Neuropsychological testing ([facility]): no cognitive impairment or sign of  neurodegenerative disorder.  Likely has adjustment d/o with mixed anxiety/depressed features and may benefit from low dose SNRI.    . IFG (impaired fasting glucose)     HbA1c jumped from 5.7% to 6.1% 03/2015---started metformin at that time.   BP 142/69 mmHg  Pulse 85  SpO2 96%  Opioid Risk Score:   Fall Risk Score:  `1  Depression screen PHQ 2/9  Depression screen [facility] 2/9 03/16/2015 02/16/2015 09/16/2014  Decreased Interest 0 0 1  Down, Depressed, Hopeless 0 0 0  PHQ - 2 Score 0 0 1  Altered sleeping - - 2  Tired, decreased energy - - 2  Change in appetite - - 1  Feeling bad or failure about yourself  - - 0  Trouble concentrating - - 0  Moving slowly or fidgety/restless - - 0  Suicidal thoughts - - 0  PHQ-9 Score - - 6     Review of Systems  Constitutional: Positive for fever, chills and diaphoresis.  Respiratory: Positive for apnea and shortness of breath.   Skin: Positive for rash.  Neurological: Positive for tremors and numbness.       Tingling Gait Instability  All other systems reviewed and are negative.      Objective:   Physical Exam  Constitutional: He appears well-developed.  MO  HENT:  Head: Normocephalic and atraumatic.  Eyes: Conjunctivae and EOM are normal. Pupils are equal, round, and reactive to light.  Neurological:  Decreased vibratory sensation to the knees in both lower limbs Intact vibratory sensation bilateral thumbs Motor strength is 5/5 bilateral hip flexion and knee extension and ankle dorsiflexion Ambulation is with a cane  Nursing note and vitals reviewed.  Gait is slow and wide-based with short step length       Assessment & Plan:  1. Lumbar spinal stenosis as well as Idiopathic peripheral neuropathy. He has fairly consistent left S1 radicular discomfort although his symptoms very in intensity day-to-day. He will be undergoing spinal cord stimulation trial  For his back pain will increase oxycodone 10 mg to 4 times a day, Once his  wife recovers from surgery the plan would be to start weaning back down to 3 times a day or even twice a day Continue gabapentin 600 mg 4 times a day Nurse practitioner visit one month Continue opioid monitoring program. This consists of regular clinic visits, examinations, urine drug screen, pill counts as well as use of [place] controlled substance reporting System. Over half of the 25 min visit was spent counseling and coordinating care.

## 2015-05-19 ENCOUNTER — Ambulatory Visit (INDEPENDENT_AMBULATORY_CARE_PROVIDER_SITE_OTHER): Payer: Medicare Other | Admitting: Family Medicine

## 2015-05-19 ENCOUNTER — Encounter: Payer: Self-pay | Admitting: Family Medicine

## 2015-05-19 VITALS — BP 145/76 | HR 79 | Temp 98.3°F | Resp 16 | Wt 361.0 lb

## 2015-05-19 DIAGNOSIS — J069 Acute upper respiratory infection, unspecified: Secondary | ICD-10-CM

## 2015-05-19 DIAGNOSIS — B309 Viral conjunctivitis, unspecified: Secondary | ICD-10-CM | POA: Diagnosis not present

## 2015-05-19 NOTE — Progress Notes (Signed)
OFFICE NOTE  05/19/2015  CC:  Chief Complaint  Patient presents with  . URI    x 10 days   HPI: Patient is a 74 y.o. Caucasian male who is here for respiratory symptoms.   Onset about 10d ago, nasal congestion/mucous clear/yellow/green, soon with PND and some ST. Had a couple days of temp up to 100.1 but none in the last week or so.  No cough.  No wheezing. He sense of chest discomfort when walking "similar to breathing cold air".    Eyes crusted over yesterday upon awakening.  Also noted again today. His URI sx's are otherwise improved lately.  Some appetite loss.  No HA or face pain.  Zycam and nasalcrom started and ST went away.  Pertinent PMH:  Past medical, surgical, social, and family history reviewed and no changes are noted since last office visit.  MEDS:  Outpatient Prescriptions Prior to Visit  Medication Sig Dispense Refill  . albuterol (PROVENTIL HFA;VENTOLIN HFA) 108 (90 BASE) MCG/ACT inhaler Inhale 2 puffs into the lungs 4 (four) times daily as needed for wheezing or shortness of breath.    . allopurinol (ZYLOPRIM) 300 MG tablet Take 1 tablet (300 mg total) by mouth daily. take 1 tablet by mouth with meals 30 tablet 6  . apixaban (ELIQUIS) 5 MG TABS tablet Take 1 tablet (5 mg total) by mouth 2 (two) times daily. 90 tablet 1  . B Complex-C (B-COMPLEX WITH VITAMIN C) tablet Take 1 tablet by mouth daily.    . clotrimazole-betamethasone (LOTRISONE) cream Apply 1 application topically 2 (two) times daily. 45 g 6  . CRESTOR 40 MG tablet take 1 tablet by mouth once daily 90 tablet 0  . ezetimibe (ZETIA) 10 MG tablet Take 1 tablet (10 mg total) by mouth daily. 30 tablet 4  . finasteride (PROSCAR) 5 MG tablet take 1 tablet by mouth once daily 90 tablet 1  . fluticasone (FLONASE) 50 MCG/ACT nasal spray Place 2 sprays into both nostrils as needed for allergies or rhinitis.    . Fluticasone-Salmeterol (ADVAIR) 250-50 MCG/DOSE AEPB Inhale 1 puff into the lungs daily as needed  (for nasal congestion).     . gabapentin (NEURONTIN) 600 MG tablet take 1 tablet by mouth four times a day with meals 120 tablet 3  . metFORMIN (GLUCOPHAGE) 500 MG tablet Take 1 tablet (500 mg total) by mouth 2 (two) times daily with a meal. 60 tablet 4  . metoprolol succinate (TOPROL XL) 25 MG 24 hr tablet Take 1 tablet (25 mg total) by mouth daily. 90 tablet 3  . metoprolol succinate (TOPROL-XL) 50 MG 24 hr tablet Take with or immediately following a meal. 90 tablet 3  . Multiple Vitamin (MULTIVITAMIN) capsule Take 1 capsule by mouth daily.    . Niacin CR 1000 MG TBCR Take 1 tablet (1,000 mg total) by mouth 2 (two) times daily. 180 each 3  . omeprazole (PRILOSEC) 10 MG capsule Take 1 capsule (10 mg total) by mouth daily. 30 capsule 6  . Oxycodone HCl 10 MG TABS Take 1 tablet (10 mg total) by mouth 3 (three) times daily. 120 tablet 0  . potassium chloride SA (K-DUR,KLOR-CON) 20 MEQ tablet Take 1 tablet (20 mEq total) by mouth daily. 90 tablet 1  . TEMAZEPAM PO Take 15 mg by mouth as needed.     . traMADol-acetaminophen (ULTRACET) 37.5-325 MG tablet take 1 tablet by mouth every 6 hours if needed for pain 360 tablet 1  . VITAMIN D, CHOLECALCIFEROL,  PO Take by mouth.    . Probiotic Product (PROBIOTIC DAILY PO) Take by mouth as needed.      No facility-administered medications prior to visit.    PE: Blood pressure 145/76, pulse 79, temperature 98.3 F (36.8 C), temperature source Oral, resp. rate 16, weight 361 lb (163.749 kg), SpO2 93 %. VS: noted--normal. Gen: alert, NAD, NONTOXIC APPEARING. HEENT: eyes with minimal L>R bulbar conjunctival  Injection.  A few dry yellowish crusts are noted around lid egdes.  No swelling or active drainage/exudate.  No injection of the palpebral conjunctiva.  No hordeolum or chalazion.  Ears: EACs clear, TMs with normal light reflex and landmarks.  Nose: Clear rhinorrhea, with some dried, crusty exudate adherent to mildly injected mucosa.  No purulent d/c.  No  paranasal sinus TTP.  No facial swelling.  Throat and mouth without focal lesion.  No pharyngial swelling, erythema, or exudate.   Neck: supple, no LAD.   LUNGS: CTA bilat, nonlabored resps.   CV: RRR, no m/r/g. EXT: no c/c/e SKIN: no rash    IMPRESSION AND PLAN: Viral URI with viral conjunctivitis. Discussed dx's with pt, reassured him of the requirement of only symptomatic care--lid soaks and/or warm compresses to eyes 1-2 times a day.  No antibiotic drops or other meds necessary. Signs/symptoms to call or return for were reviewed and pt expressed understanding.  An After Visit Summary was printed and given to the patient.  FOLLOW UP: prn

## 2015-05-19 NOTE — Progress Notes (Signed)
Pre visit review using our clinic review tool, if applicable. No additional management support is needed unless otherwise documented below in the visit note. 

## 2015-05-19 NOTE — Patient Instructions (Signed)
Viral Conjunctivitis Viral conjunctivitis is an inflammation of the clear membrane that covers the white part of your eye and the inner surface of your eyelid (conjunctiva). The inflammation is caused by a viral infection. The blood vessels in the conjunctiva become inflamed, causing the eye to become red or pink, and often itchy. Viral conjunctivitis can easily be passed from one person to another (contagious). CAUSES  Viral conjunctivitis is caused by a virus. A virus is a type of contagious germ. It can be spread by touching objects that have been contaminated with the virus, such as doorknobs or towels.  SYMPTOMS  Symptoms of viral conjunctivitis may include:   Eye redness.  Tearing or watery eyes.  Itchy eyes.  Burning feeling in the eyes.  Clear drainage from the eye.  Swollen eyelids.  A gritty feeling in the eye.  Light sensitivity. DIAGNOSIS  Viral conjunctivitis may be diagnosed with a medical history and physical exam. If you have discharge from your eye, the discharge may be tested to rule out other causes of conjunctivitis.  TREATMENT  Viral conjunctivitis does not respond to medicines that kill bacteria (antibiotics). Treatment for viral conjunctivitis is directed at stopping a bacterial infection from developing in addition to the viral infection. Treatment also aims to relieve your symptoms, such as itching. This may be done with antihistamine drops or other eye medicines. HOME CARE INSTRUCTIONS  Take medicines only as directed by your health care provider.  Avoid touching or rubbing your eyes.  Apply a warm, clean washcloth to your eye for 10-20 minutes, 3-4 times per day.  If you wear contact lenses, do not wear them until the inflammation is gone and your health care provider says it is safe to wear them again. Ask your health care provider how to sterilize or replace your contact lenses before using them again. Wear glasses until you can resume wearing  contacts.  Avoid wearing eye makeup until the inflammation is gone. Throw away any old eye cosmetics that may be contaminated.  Change or wash your pillowcase every day.  Do not share towels or washcloths. This may spread the infection.  Wash your hands often with soap and water. Use paper towels to dry your hands.  Gently wipe away any drainage from your eye with a warm, wet washcloth or a cotton ball.  Be very careful to avoid touching the edge of the eyelid with the eye drop bottle or ointment tube when applying medicines to the affected eye. This will stop you from spreading the infection to the other eye or to other people. SEEK MEDICAL CARE IF:   Your symptoms do not improve with treatment.  You have increased pain.  Your vision becomes blurry.  You have a fever.  You have facial pain, redness, or swelling.  You have new symptoms.  Your symptoms get worse.   This information is not intended to replace advice given to you by your health care provider. Make sure you discuss any questions you have with your health care provider.   Document Released: 09/09/2002 Document Revised: 12/11/2005 Document Reviewed: 03/31/2014 Elsevier Interactive Patient Education 2016 Elsevier Inc.  

## 2015-05-26 ENCOUNTER — Telehealth: Payer: Self-pay | Admitting: *Deleted

## 2015-05-26 MED ORDER — DOXYCYCLINE HYCLATE 100 MG PO TABS: 100.0000 mg | ORAL_TABLET | Freq: Two times a day (BID) | ORAL | Status: DC

## 2015-05-26 NOTE — Telephone Encounter (Signed)
Pt LMOM on 05/26/15 at 2:19pm. He stated that he needs a Rx for Doxy 138m BID sent to his pharmacy for cellulitis. He stated that he has had this a few times and was told by Dr. MAnitra Lauththat he could just call for refill. Please advise. Thanks.

## 2015-05-26 NOTE — Telephone Encounter (Signed)
Pt advised and voiced understanding.   

## 2015-05-26 NOTE — Telephone Encounter (Signed)
OK, doxy rx sent as per pt request.

## 2015-06-08 ENCOUNTER — Encounter: Payer: Medicare Other | Attending: Registered Nurse

## 2015-06-08 ENCOUNTER — Encounter: Payer: Self-pay | Admitting: Physical Medicine & Rehabilitation

## 2015-06-08 ENCOUNTER — Ambulatory Visit (HOSPITAL_BASED_OUTPATIENT_CLINIC_OR_DEPARTMENT_OTHER): Payer: Medicare Other | Admitting: Physical Medicine & Rehabilitation

## 2015-06-08 VITALS — BP 150/72 | HR 67

## 2015-06-08 DIAGNOSIS — E7800 Pure hypercholesterolemia, unspecified: Secondary | ICD-10-CM | POA: Diagnosis not present

## 2015-06-08 DIAGNOSIS — G894 Chronic pain syndrome: Secondary | ICD-10-CM

## 2015-06-08 DIAGNOSIS — M109 Gout, unspecified: Secondary | ICD-10-CM | POA: Diagnosis not present

## 2015-06-08 DIAGNOSIS — G609 Hereditary and idiopathic neuropathy, unspecified: Secondary | ICD-10-CM | POA: Diagnosis not present

## 2015-06-08 DIAGNOSIS — M4806 Spinal stenosis, lumbar region: Secondary | ICD-10-CM | POA: Diagnosis not present

## 2015-06-08 DIAGNOSIS — M1990 Unspecified osteoarthritis, unspecified site: Secondary | ICD-10-CM | POA: Diagnosis not present

## 2015-06-08 DIAGNOSIS — Z89412 Acquired absence of left great toe: Secondary | ICD-10-CM | POA: Diagnosis not present

## 2015-06-08 DIAGNOSIS — Z5181 Encounter for therapeutic drug level monitoring: Secondary | ICD-10-CM

## 2015-06-08 DIAGNOSIS — M48061 Spinal stenosis, lumbar region without neurogenic claudication: Secondary | ICD-10-CM

## 2015-06-08 DIAGNOSIS — I2510 Atherosclerotic heart disease of native coronary artery without angina pectoris: Secondary | ICD-10-CM | POA: Insufficient documentation

## 2015-06-08 DIAGNOSIS — K219 Gastro-esophageal reflux disease without esophagitis: Secondary | ICD-10-CM | POA: Diagnosis not present

## 2015-06-08 DIAGNOSIS — J449 Chronic obstructive pulmonary disease, unspecified: Secondary | ICD-10-CM | POA: Insufficient documentation

## 2015-06-08 DIAGNOSIS — M79672 Pain in left foot: Secondary | ICD-10-CM | POA: Diagnosis present

## 2015-06-08 MED ORDER — OXYCODONE HCL 10 MG PO TABS: 10.0000 mg | ORAL_TABLET | Freq: Three times a day (TID) | ORAL | Status: DC

## 2015-06-08 NOTE — Progress Notes (Signed)
Subjective:    Patient ID: Tim [name], male    DOB: [birth year], 74 y.o.   MRN: [id number]  HPI Patient is going to see psychology tomorrow morning as a prescreening for the spinal cord stimulation trial. Not doing as much physical assistance for his wife. She is now recovering from surgery. Patient was bumped up from 3 oxycodone 10 mg tablets per day to 4 oxycodone 10 mg tablets per day last month. He is now reduced his dose to 3 tablets a day once again. His pain is back to his usual baseline. Patient is wearing a Velcro cast shoe on his left foot. This does help with his neuropathy pain.  Pain Inventory Average Pain 3 Pain Right Now 3 My pain is intermittent, sharp, dull, stabbing and aching  In the last 24 hours, has pain interfered with the following? General activity 8 Relation with others 6 Enjoyment of life 6 What TIME of day is your pain at its worst? night Sleep (in general) Poor  Pain is worse with: walking, bending, standing and some activites Pain improves with: rest, pacing activities, medication and TENS Relief from Meds: NA  Mobility walk with assistance use a cane how many minutes can you walk? 5 ability to climb steps?  yes do you drive?  yes  Function retired I need assistance with the following:  household duties and shopping  Neuro/Psych weakness numbness tremor tingling trouble walking  Prior Studies Any changes since last visit?  no  Physicians involved in your care Any changes since last visit?  no   Family History  Problem Relation Age of Onset  . Hypertension Mother   . Coronary artery disease Mother   . Heart attack Mother   . Pulmonary fibrosis Father     asbestosis  . Neuropathy Mother    Social History   Social History  . Marital Status: Married    Spouse Name: [name]  . Number of Children: 0  . Years of Education: 20   Occupational History  . engineering     retired   Social History Main Topics  . Smoking  status: Never Smoker   . Smokeless tobacco: Never Used  . Alcohol Use: No  . Drug Use: No  . Sexual Activity: Not Currently   Other Topics Concern  . None   Social History Narrative   HSG, [name] - BS, [name] - MS-engineering, 2 years on PhD - [name]. Married - '65 - 47yr/divorced; '76- 3 yrs/divorced; '92 . No children. 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 ACP/Living Will - Yes CPR; long-term Mechanical ventilation as long as he was able to cognate; ok for long term artificial nutrition. Precondition being able to cognate and not to have too much pain.    Past Surgical History  Procedure Laterality Date  . Hemorrhoid surgery    . Vitrectomy    . Intraocular lens insertion Bilateral   . Leg surgery Bilateral   . Knee surgery Right   . Traeculectomy Left     eye  . Shoulder arthroscopy  08/30/2011    Procedure: ARTHROSCOPY SHOULDER;  Surgeon: [name] MD;  [place] [facility]  Service: Orthopedics;  Laterality: Right;  Right Shoulder Arthroscopy, Debridement, and Decompression  . Tonsillectomy    . Colonoscopy    . Cholecystectomy    . 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  R big toe surg due to osteoarth  . Colonoscopy [name]  10/30/2012    Procedure: COLONOSCOPY;  Surgeon: [name], MD;  [facility];  Service: Endoscopy;  Laterality: [name];  . Cardiac catheterization  1997    Non-obstructive disease  . Cardiac catheterization  12/24/2012    mild < 20% LCx, prox 30% RCA; LVEF 55-65% , moderate pulmonary HTN, moderate AS  . Eye surgery Bilateral cataract  . Amputation Left 04/11/2013    Procedure: AMPUTATION DIGIT Left 3rd toe;  Surgeon: [name], MD;  Location: [place];  Service: Orthopedics;  Laterality: Left;  Left 3rd toe amputation at [facility]  . Left and right heart catheterization with coronary angiogram [name] 12/24/2012    Procedure: LEFT AND RIGHT HEART CATHETERIZATION WITH CORONARY ANGIOGRAM;  Surgeon: Peter M  Martinique, MD;  Location: [facility] CATH LAB;  Service: Cardiovascular;  Laterality: [name];  . Transthoracic echocardiogram  01/2015    No signif change in aortic stenosis (moderate).  Severe LVH w/small LV cavity, EF 60-65%, grade I diast dysfxn.   Past Medical History  Diagnosis Date  . HYPERCHOLESTEROLEMIA-PURE   . GOUT   . OSA on CPAP     8 cm H2O  . Essential hypertension, benign   . VENOUS INSUFFICIENCY   . ASTHMA   . COPD   . VENTRAL HERNIA   . OSTEOARTHRITIS   . CATARACT, HX OF   . PULMONARY HYPERTENSION, HX OF   . Hallux valgus (acquired)   . Obesity, unspecified   . Osteoarthrosis, unspecified whether generalized or localized, unspecified site   . Unspecified glaucoma   . Moderate aortic stenosis     Per 12/2012 echo- (VTI 1.08 cm^2, Vmax 1.11 cm ^2), mild LA dilatation  . Paroxysmal atrial fibrillation ([place]) 2003    Off anticoag for a while due to falls.  Then apixaban started 12/2014.  . Lumbosacral neuritis   . Unspecified hereditary and idiopathic peripheral neuropathy approx age 48    bilat LE's, ? left arm, too.  Feet became progressively numb + left foot pain intermittently.  Pt may be trying a spinal stimulator (as of 05/2015)  . Lumbosacral spondylosis   . CAD (coronary artery disease)     Nonobstructive by 12/2012 cath  . PUD (peptic ulcer disease)   . BPH (benign prostatic hypertrophy)   . HH (hiatus hernia)   . Glaucoma   . GERD (gastroesophageal reflux disease)   . Personal history of colonic adenoma 10/30/2012  . Shortness of breath     with exertion  . Peripheral neuropathy ([place])     Heredetary; with chronic neuropathic pain (Dr. Ella Bodo)  . Normal memory function 08/2014    Neuropsychological testing ([facility]): no cognitive impairment or sign of neurodegenerative disorder.  Likely has adjustment d/o with mixed anxiety/depressed features and may benefit from low dose SNRI.    . IFG (impaired fasting glucose)     HbA1c jumped from 5.7% to 6.1%  03/2015---started metformin at that time.  . Obesity hypoventilation syndrome ([facility])    BP 150/72 mmHg  Pulse 67  SpO2 98%  Opioid Risk Score:   Fall Risk Score:  `1  Depression screen PHQ 2/9  Depression screen [facility] 2/9 03/16/2015 02/16/2015 09/16/2014  Decreased Interest 0 0 1  Down, Depressed, Hopeless 0 0 0  PHQ - 2 Score 0 0 1  Altered sleeping - - 2  Tired, decreased energy - - 2  Change in appetite - - 1  Feeling bad or failure about yourself  - - 0  Trouble concentrating - -  0  Moving slowly or fidgety/restless - - 0  Suicidal thoughts - - 0  PHQ-9 Score - - 6     Review of Systems  Constitutional: Positive for unexpected weight change.  Musculoskeletal: Positive for gait problem.  Skin: Positive for rash.  Neurological: Positive for tremors, weakness and numbness.       Tingling  All other systems reviewed and are negative.      Objective:   Physical Exam Lumbar spine with reduced range of motion 50% flexion next 25% extension and lateral bending.  No tenderness palpation lumbar paraspinal muscles. Negative straight leg raising Right shoe is diabetic shoe  Patient has good strength bilateral hip flexors knee extensors ankle dorsiflexor and plantar flexor  Left leg with stasis dermatitis changes 2+ edema. 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 Neuropathic pain as well as chronic lumbar pain we'll continue oxycodone 10 mg 3 times per day as well as Gabapentin 661m. 4 times per day Will follow up with Dr. NErnestina Patchesfor spinal cord stimulator trial depending on result of psychology evaluation.  Follow-up with nurse practitioner one month.

## 2015-06-08 NOTE — Addendum Note (Signed)
Addended by: Gerald Leitz A on: 06/08/2015 04:05 PM   Modules accepted: Orders

## 2015-06-09 ENCOUNTER — Other Ambulatory Visit: Payer: Self-pay | Admitting: Physical Medicine & Rehabilitation

## 2015-06-10 LAB — PMP ALCOHOL METABOLITE (ETG): ETGU: NEGATIVE ng/mL

## 2015-06-11 ENCOUNTER — Other Ambulatory Visit: Payer: Self-pay | Admitting: Cardiology

## 2015-06-11 ENCOUNTER — Other Ambulatory Visit: Payer: Self-pay | Admitting: *Deleted

## 2015-06-11 MED ORDER — FINASTERIDE 5 MG PO TABS: ORAL_TABLET | ORAL | Status: DC

## 2015-06-11 NOTE — Telephone Encounter (Signed)
RF request for finasteride LOV: 03/22/15 Next ov: 07/22/15 Last written: 11/19/14 #90 w/ 1RF

## 2015-06-13 LAB — TRAMADOL, URINE
N-DESMETHYL-CIS-TRAMADOL: 12068 ng/mL (ref <100)
Tramadol, Urine: 34072 ng/mL (ref <100)

## 2015-06-13 LAB — OPIATES/OPIOIDS (LC/MS-MS)
Codeine Urine: NEGATIVE ng/mL (ref <50)
Hydrocodone: NEGATIVE ng/mL (ref <50)
Hydromorphone: NEGATIVE ng/mL (ref <50)
Morphine Urine: NEGATIVE ng/mL (ref <50)
Norhydrocodone, Ur: NEGATIVE ng/mL (ref <50)
Noroxycodone, Ur: 4779 ng/mL (ref <50)
OXYCODONE, UR: 5997 ng/mL (ref <50)
OXYMORPHONE, URINE: 3750 ng/mL (ref <50)

## 2015-06-13 LAB — OXYCODONE, URINE (LC/MS-MS)
NOROXYCODONE, UR: 4779 ng/mL (ref <50)
OXYCODONE, UR: 5997 ng/mL (ref <50)
OXYMORPHONE, URINE: 3750 ng/mL (ref <50)

## 2015-06-15 LAB — PRESCRIPTION MONITORING PROFILE (SOLSTAS)
Amphetamine/Meth: NEGATIVE ng/mL
BENZODIAZEPINE SCREEN, URINE: NEGATIVE ng/mL
Barbiturate Screen, Urine: NEGATIVE ng/mL
Buprenorphine, Urine: NEGATIVE ng/mL
CANNABINOID SCRN UR: NEGATIVE ng/mL
COCAINE METABOLITES: NEGATIVE ng/mL
CREATININE, URINE: 171.17 mg/dL (ref >20.0)
Carisoprodol, Urine: NEGATIVE ng/mL
FENTANYL URINE: NEGATIVE ng/mL
MDMA URINE: NEGATIVE ng/mL
MEPERIDINE UR: NEGATIVE ng/mL
METHADONE SCREEN, URINE: NEGATIVE ng/mL
Nitrites, Initial: NEGATIVE ug/mL
Propoxyphene: NEGATIVE ng/mL
Tapentadol, urine: NEGATIVE ng/mL
Zolpidem, Urine: NEGATIVE ng/mL
pH, Initial: 5.3 pH (ref 4.5–8.9)

## 2015-06-16 NOTE — Progress Notes (Signed)
Urine drug screen for this encounter is consistent for prescribed medication 

## 2015-07-04 DIAGNOSIS — E291 Testicular hypofunction: Secondary | ICD-10-CM

## 2015-07-04 HISTORY — DX: Testicular hypofunction: E29.1

## 2015-07-06 ENCOUNTER — Encounter: Payer: Medicare Other | Admitting: Registered Nurse

## 2015-07-10 ENCOUNTER — Other Ambulatory Visit: Payer: Self-pay | Admitting: Family Medicine

## 2015-07-13 ENCOUNTER — Encounter: Payer: Self-pay | Admitting: Cardiology

## 2015-07-15 ENCOUNTER — Encounter: Payer: Medicare Other | Attending: Registered Nurse | Admitting: Registered Nurse

## 2015-07-15 ENCOUNTER — Encounter: Payer: Self-pay | Admitting: Registered Nurse

## 2015-07-15 VITALS — BP 153/57 | HR 87

## 2015-07-15 DIAGNOSIS — M109 Gout, unspecified: Secondary | ICD-10-CM | POA: Diagnosis not present

## 2015-07-15 DIAGNOSIS — G894 Chronic pain syndrome: Secondary | ICD-10-CM | POA: Diagnosis not present

## 2015-07-15 DIAGNOSIS — E7800 Pure hypercholesterolemia, unspecified: Secondary | ICD-10-CM | POA: Insufficient documentation

## 2015-07-15 DIAGNOSIS — I2510 Atherosclerotic heart disease of native coronary artery without angina pectoris: Secondary | ICD-10-CM | POA: Diagnosis not present

## 2015-07-15 DIAGNOSIS — M48061 Spinal stenosis, lumbar region without neurogenic claudication: Secondary | ICD-10-CM

## 2015-07-15 DIAGNOSIS — M25562 Pain in left knee: Secondary | ICD-10-CM

## 2015-07-15 DIAGNOSIS — J449 Chronic obstructive pulmonary disease, unspecified: Secondary | ICD-10-CM | POA: Diagnosis not present

## 2015-07-15 DIAGNOSIS — M1990 Unspecified osteoarthritis, unspecified site: Secondary | ICD-10-CM | POA: Diagnosis not present

## 2015-07-15 DIAGNOSIS — M545 Low back pain, unspecified: Secondary | ICD-10-CM

## 2015-07-15 DIAGNOSIS — M4806 Spinal stenosis, lumbar region: Secondary | ICD-10-CM

## 2015-07-15 DIAGNOSIS — M25561 Pain in right knee: Secondary | ICD-10-CM

## 2015-07-15 DIAGNOSIS — K219 Gastro-esophageal reflux disease without esophagitis: Secondary | ICD-10-CM | POA: Diagnosis not present

## 2015-07-15 DIAGNOSIS — G609 Hereditary and idiopathic neuropathy, unspecified: Secondary | ICD-10-CM | POA: Diagnosis not present

## 2015-07-15 DIAGNOSIS — M79672 Pain in left foot: Secondary | ICD-10-CM | POA: Diagnosis present

## 2015-07-15 DIAGNOSIS — G8929 Other chronic pain: Secondary | ICD-10-CM

## 2015-07-15 MED ORDER — OXYCODONE HCL 10 MG PO TABS: 10.0000 mg | ORAL_TABLET | Freq: Four times a day (QID) | ORAL | Status: DC | PRN

## 2015-07-15 NOTE — Progress Notes (Signed)
Subjective:    Patient ID: Tim [name], male    DOB: 1940-08-02, 75 y.o.   MRN: [id number]  HPI: Tim [name] is a 75 year old male who returns for follow up for chronic pain and medication refill. He says his pain is located in his lower back radiating into his right lower extremity anteriorly and bilateral knees left greater than right. Also states his pain has intensified in his left foot we will increase his oxycodone frequency this month and re-evaluated next month he verbalizes understanding.He rates his pain 7. His current exercise regime is walking daily for 5-10 minutes daily.  Also states he fell on 07/04/2015 he was walking in his home when his right leg gave out and he landed on his right side. He was able to pick himself up.  Pain Inventory Average Pain 7 Pain Right Now 7 My pain is intermittent, sharp, dull, stabbing, tingling and aching  In the last 24 hours, has pain interfered with the following? General activity 8 Relation with others 6 Enjoyment of life 7 What TIME of day is your pain at its worst? evening Sleep (in general) Poor  Pain is worse with: walking, bending, standing and some activites Pain improves with: rest, pacing activities and medication Relief from Meds: 4  Mobility use a cane how many minutes can you walk? 5 ability to climb steps?  yes do you drive?  yes  Function retired I need assistance with the following:  household duties and shopping  Neuro/Psych weakness numbness tremor tingling trouble walking  Prior Studies Any changes since last visit?  no  Physicians involved in your care Any changes since last visit?  no   Family History  Problem Relation Age of Onset  . Hypertension Mother   . Coronary artery disease Mother   . Heart attack Mother   . Pulmonary fibrosis Father     asbestosis  . Neuropathy Mother    Social History   Social History  . Marital Status: Married    Spouse Name: [name]  . Number of  Children: 0  . Years of Education: 20   Occupational History  . engineering     retired   Social History Main Topics  . Smoking status: Never Smoker   . Smokeless tobacco: Never Used  . Alcohol Use: No  . Drug Use: No  . Sexual Activity: Not Currently   Other Topics Concern  . None   Social History Narrative   HSG, [name] - BS, [name] - MS-engineering, 2 years on PhD - [name]. Married - '65 - 8yr/divorced; '76- 3 yrs/divorced; '92 . No children. 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 ACP/Living Will - Yes CPR; long-term Mechanical ventilation as long as he was able to cognate; ok for long term artificial nutrition. Precondition being able to cognate and not to have too much pain.    Past Surgical History  Procedure Laterality Date  . Hemorrhoid surgery    . Vitrectomy    . Intraocular lens insertion Bilateral   . Leg surgery Bilateral   . Knee surgery Right   . Traeculectomy Left     eye  . Shoulder arthroscopy  08/30/2011    Procedure: ARTHROSCOPY SHOULDER;  Surgeon: [name] MD;  Location: [facility]  Service: Orthopedics;  Laterality: Right;  Right Shoulder Arthroscopy, Debridement, and Decompression  . Tonsillectomy    . Colonoscopy    . Cholecystectomy    .  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  R big toe surg due to osteoarth  . Colonoscopy [name] 10/30/2012    Procedure: COLONOSCOPY;  Surgeon: [name], MD;  [facility]: [facility];  Service: Endoscopy;  Laterality: [name];  . Cardiac catheterization  1997    Non-obstructive disease  . Cardiac catheterization  12/24/2012    mild < 20% LCx, prox 30% RCA; LVEF 55-65% , moderate pulmonary HTN, moderate AS  . Eye surgery Bilateral cataract  . Amputation Left 04/11/2013    Procedure: AMPUTATION DIGIT Left 3rd toe;  Surgeon: [name], MD;  Location: [place];  Service: Orthopedics;  Laterality: Left;  Left 3rd toe amputation at [facility]  . Left and right heart catheterization  with coronary angiogram [name] 12/24/2012    Procedure: LEFT AND RIGHT HEART CATHETERIZATION WITH CORONARY ANGIOGRAM;  Surgeon: [name], MD;  Location: [facility] CATH LAB;  Service: Cardiovascular;  Laterality: [name];  . Transthoracic echocardiogram  01/2015    No signif change in aortic stenosis (moderate).  Severe LVH w/small LV cavity, EF 60-65%, grade I diast dysfxn.   Past Medical History  Diagnosis Date  . HYPERCHOLESTEROLEMIA-PURE   . GOUT   . OSA on CPAP     8 cm H2O  . Essential hypertension, benign   . VENOUS INSUFFICIENCY   . ASTHMA   . COPD   . VENTRAL HERNIA   . OSTEOARTHRITIS   . CATARACT, HX OF   . PULMONARY HYPERTENSION, HX OF   . Hallux valgus (acquired)   . Obesity, unspecified   . Osteoarthrosis, unspecified whether generalized or localized, unspecified site   . Unspecified glaucoma   . Moderate aortic stenosis     Per 12/2012 echo- (VTI 1.08 cm^2, Vmax 1.11 cm ^2), mild LA dilatation  . Paroxysmal atrial fibrillation ([place]) 2003    Off anticoag for a while due to falls.  Then apixaban started 12/2014.  . Lumbosacral neuritis   . Unspecified hereditary and idiopathic peripheral neuropathy approx age 58    bilat LE's, ? left arm, too.  Feet became progressively numb + left foot pain intermittently.  Pt may be trying a spinal stimulator (as of 05/2015)  . Lumbosacral spondylosis   . CAD (coronary artery disease)     Nonobstructive by 12/2012 cath  . PUD (peptic ulcer disease)   . BPH (benign prostatic hypertrophy)   . HH (hiatus hernia)   . Glaucoma   . GERD (gastroesophageal reflux disease)   . Personal history of colonic adenoma 10/30/2012  . Shortness of breath     with exertion  . Peripheral neuropathy ([place])     Heredetary; with chronic neuropathic pain (Dr. Ella Bodo)  . Normal memory function 08/2014    Neuropsychological testing ([facility]): no cognitive impairment or sign of neurodegenerative disorder.  Likely has adjustment d/o with mixed  anxiety/depressed features and may benefit from low dose SNRI.    . IFG (impaired fasting glucose)     HbA1c jumped from 5.7% to 6.1% 03/2015---started metformin at that time.  . Obesity hypoventilation syndrome (H[facility])    BP 153/57 mmHg  Pulse 87  SpO2 95%  Opioid Risk Score:   Fall Risk Score:  `1  Depression screen PHQ 2/9  Depression screen [facility] 2/9 07/15/2015 03/16/2015 02/16/2015 09/16/2014  Decreased Interest 0 0 0 1  Down, Depressed, Hopeless 0 0 0 0  PHQ - 2 Score 0 0 0 1  Altered sleeping - - - 2  Tired, decreased energy - - - 2  Change in appetite - - - 1  Feeling bad or failure about yourself  - - - 0  Trouble concentrating - - - 0  Moving slowly or fidgety/restless - - - 0  Suicidal thoughts - - - 0  PHQ-9 Score - - - 6     Review of Systems  Constitutional: Positive for unexpected weight change.  Respiratory: Positive for apnea and shortness of breath.        URI  Cardiovascular: Positive for leg swelling.  Skin: Positive for rash.  All other systems reviewed and are negative.      Objective:   Physical Exam  Constitutional: He is oriented to person, place, and time. He appears well-developed and well-nourished.  HENT:  Head: Normocephalic and atraumatic.  Neck: Normal range of motion. Neck supple.  Cardiovascular: Normal rate and regular rhythm.   Pulmonary/Chest: Effort normal and breath sounds normal.  Musculoskeletal:  Normal Muscle Bulk and Muscle Testing Reveals: Upper Extremities: Right:Decreased ROM 45 Degrees and Muscle Strength 5/5 Left: Full ROM and Muscle Strength 5/5 Back without spinal or paraspinal tenderness Lower Extremities: Full ROM and Muscle Strength 5/5 Left Patella with resolving ecchymosis Tenderness with Palpation WearingLeft open shoe Arises from chair slowly Antalgic Gait  Neurological: He is alert and oriented to person, place, and time.  Skin: Skin is warm and dry.  Psychiatric: He has a normal mood and affect.  Nursing  note and vitals reviewed.         Assessment & Plan:  1. 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  Refilled: Increased:Oxycodone 10 mg ---use one every 6 hours as needed for pain.#120 will re-evaluate next month. Continue with Gabapentin.  2. DDD lumbar spine with LBP: Neurology Following. Continue with activity and heat therapy.   20 minutes of face to face patient care time was spent during this visit. All questions were encouraged and answered.   F/U in 1 month

## 2015-07-22 ENCOUNTER — Ambulatory Visit (INDEPENDENT_AMBULATORY_CARE_PROVIDER_SITE_OTHER): Payer: Medicare Other | Admitting: Family Medicine

## 2015-07-22 ENCOUNTER — Encounter: Payer: Self-pay | Admitting: Family Medicine

## 2015-07-22 ENCOUNTER — Other Ambulatory Visit: Payer: Self-pay | Admitting: Cardiology

## 2015-07-22 ENCOUNTER — Other Ambulatory Visit: Payer: Self-pay | Admitting: Family Medicine

## 2015-07-22 VITALS — BP 120/75 | HR 68 | Temp 98.0°F | Resp 16 | Wt 357.8 lb

## 2015-07-22 DIAGNOSIS — J069 Acute upper respiratory infection, unspecified: Secondary | ICD-10-CM | POA: Diagnosis not present

## 2015-07-22 DIAGNOSIS — I10 Essential (primary) hypertension: Secondary | ICD-10-CM | POA: Diagnosis not present

## 2015-07-22 DIAGNOSIS — R0600 Dyspnea, unspecified: Secondary | ICD-10-CM

## 2015-07-22 DIAGNOSIS — E8881 Metabolic syndrome: Secondary | ICD-10-CM

## 2015-07-22 DIAGNOSIS — R0609 Other forms of dyspnea: Secondary | ICD-10-CM

## 2015-07-22 DIAGNOSIS — B9789 Other viral agents as the cause of diseases classified elsewhere: Secondary | ICD-10-CM

## 2015-07-22 NOTE — Progress Notes (Signed)
Pre visit review using our clinic review tool, if applicable. No additional management support is needed unless otherwise documented below in the visit note. 

## 2015-07-22 NOTE — Telephone Encounter (Signed)
Rx request sent to pharmacy.  

## 2015-07-22 NOTE — Progress Notes (Signed)
OFFICE VISIT  07/22/2015   CC:  Chief Complaint  Patient presents with  . Follow-up    Pt is not fasting.    HPI:    Patient is a 75 y.o. Caucasian male who presents for 4 mo f/u chronic/active medical issues, primarily: insulin resistance, chronic multifactorial DOE, HTN. Still a lot of nasal congestion with PND, cough/mucous/phlegm since he says he got a URI a couple months ago.  All sx's improving but very gradually.  No fevers. No change in his baseline DOE.  R knee briefly gave way around new year's and he fell onto floor in his home, hurt L knee some and twisted R ankle.  In past few days his walking is returning to normal, not requiring a walker for assistance--so his injuries seem to be resolving.  He has only been able to take the metformin about 5-10% of the days since last visit b/c of trouble with timing the med with meals, now taking irregardless of meals and is consistently taking it but only in the last few days.  Having trouble with sleeping due to chronic pain in foot--neuropathy.  Looking forward to trial of spinal stimulator in the next couple of weeks.  Sleep aids don't help.  He tends to undertreat his pain/underuse oxycodone b/c he is afraid of it, but this results in sub-optimal pain control and subsequent sleep problems.  Compliant with bp/hr meds, no home bp monitoring.  Past Medical History  Diagnosis Date  . HYPERCHOLESTEROLEMIA-PURE   . GOUT   . OSA on CPAP     8 cm H2O  . Essential hypertension, benign   . VENOUS INSUFFICIENCY   . ASTHMA   . COPD   . VENTRAL HERNIA   . OSTEOARTHRITIS   . CATARACT, HX OF   . PULMONARY HYPERTENSION, HX OF   . Hallux valgus (acquired)   . Obesity, unspecified   . Osteoarthrosis, unspecified whether generalized or localized, unspecified site   . Unspecified glaucoma   . Moderate aortic stenosis     Per 12/2012 echo- (VTI 1.08 cm^2, Vmax 1.11 cm ^2), mild LA dilatation  . Paroxysmal atrial fibrillation ([place]) 2003   Off anticoag for a while due to falls.  Then apixaban started 12/2014.  . Lumbosacral neuritis   . Unspecified hereditary and idiopathic peripheral neuropathy approx age 24    bilat LE's, ? left arm, too.  Feet became progressively numb + left foot pain intermittently.  Pt may be trying a spinal stimulator (as of 05/2015)  . Lumbosacral spondylosis   . CAD (coronary artery disease)     Nonobstructive by 12/2012 cath  . PUD (peptic ulcer disease)   . BPH (benign prostatic hypertrophy)   . HH (hiatus hernia)   . Glaucoma   . GERD (gastroesophageal reflux disease)   . Personal history of colonic adenoma 10/30/2012  . Shortness of breath     with exertion  . Peripheral neuropathy ([place])     Heredetary; with chronic neuropathic pain (Dr. Ella Bodo)  . Normal memory function 08/2014    Neuropsychological testing ([facility]): no cognitive impairment or sign of neurodegenerative disorder.  Likely has adjustment d/o with mixed anxiety/depressed features and may benefit from low dose SNRI.    . IFG (impaired fasting glucose)     HbA1c jumped from 5.7% to 6.1% 03/2015---started metformin at that time.  . Obesity hypoventilation syndrome [facility])     Past Surgical History  Procedure Laterality Date  . Hemorrhoid surgery    .  Vitrectomy    . Intraocular lens insertion Bilateral   . Leg surgery Bilateral   . Knee surgery Right   . Traeculectomy Left     eye  . Shoulder arthroscopy  08/30/2011    Procedure: ARTHROSCOPY SHOULDER;  Surgeon: [name], MD;  Location: [place];  Service: Orthopedics;  Laterality: Right;  Right Shoulder Arthroscopy, Debridement, and Decompression  . Tonsillectomy    . Colonoscopy    . Cholecystectomy    . 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  R big toe surg due to osteoarth  . Colonoscopy [name] 10/30/2012    Procedure: COLONOSCOPY;  Surgeon: [name], MD;  [facility];  Service: Endoscopy;  Laterality: [name];  . Cardiac  catheterization  1997    Non-obstructive disease  . Cardiac catheterization  12/24/2012    mild < 20% LCx, prox 30% RCA; LVEF 55-65% , moderate pulmonary HTN, moderate AS  . Eye surgery Bilateral cataract  . Amputation Left 04/11/2013    Procedure: AMPUTATION DIGIT Left 3rd toe;  Surgeon: [name], MD;  Location: [place];  Service: Orthopedics;  Laterality: Left;  Left 3rd toe amputation at [facility]  . Left and right heart catheterization with coronary angiogram [name] 12/24/2012    Procedure: LEFT AND RIGHT HEART CATHETERIZATION WITH CORONARY ANGIOGRAM;  Surgeon: [name], MD;  Location: [name] CATH LAB;  Service: Cardiovascular;  Laterality: [name];  . Transthoracic echocardiogram  01/2015    No signif change in aortic stenosis (moderate).  Severe LVH w/small LV cavity, EF 60-65%, grade I diast dysfxn.    Outpatient Prescriptions Prior to Visit  Medication Sig Dispense Refill  . albuterol (PROVENTIL HFA;VENTOLIN HFA) 108 (90 BASE) MCG/ACT inhaler Inhale 2 puffs into the lungs 4 (four) times daily as needed for wheezing or shortness of breath.    . allopurinol (ZYLOPRIM) 300 MG tablet Take 1 tablet (300 mg total) by mouth daily. take 1 tablet by mouth with meals 30 tablet 6  . apixaban (ELIQUIS) 5 MG TABS tablet Take 1 tablet (5 mg total) by mouth 2 (two) times daily. 90 tablet 1  . B Complex-C (B-COMPLEX WITH VITAMIN C) tablet Take 1 tablet by mouth daily.    . clotrimazole-betamethasone (LOTRISONE) cream Apply 1 application topically 2 (two) times daily. 45 g 6  . finasteride (PROSCAR) 5 MG tablet take 1 tablet by mouth once daily 90 tablet 1  . fluticasone (FLONASE) 50 MCG/ACT nasal spray Place 2 sprays into both nostrils as needed for allergies or rhinitis.    . Fluticasone-Salmeterol (ADVAIR) 250-50 MCG/DOSE AEPB Inhale 1 puff into the lungs daily as needed (for nasal congestion).     . gabapentin (NEURONTIN) 600 MG tablet take 1 tablet by mouth four times a day with meals 120 tablet 3  .  metFORMIN (GLUCOPHAGE) 500 MG tablet Take 1 tablet (500 mg total) by mouth 2 (two) times daily with a meal. 60 tablet 4  . metoprolol succinate (TOPROL XL) 25 MG 24 hr tablet Take 1 tablet (25 mg total) by mouth daily. 90 tablet 3  . metoprolol succinate (TOPROL-XL) 50 MG 24 hr tablet Take with or immediately following a meal. 90 tablet 3  . Multiple Vitamin (MULTIVITAMIN) capsule Take 1 capsule by mouth daily.    . Niacin CR 1000 MG TBCR Take 1 tablet (1,000 mg total) by mouth 2 (two) times daily. 180 each 3  . omeprazole (PRILOSEC) 10 MG capsule Take 1 capsule (10 mg  total) by mouth daily. 30 capsule 6  . Oxycodone HCl 10 MG TABS Take 1 tablet (10 mg total) by mouth 4 (four) times daily as needed. 120 tablet 0  . potassium chloride SA (K-DUR,KLOR-CON) 20 MEQ tablet Take 1 tablet (20 mEq total) by mouth daily. 90 tablet 1  . rosuvastatin (CRESTOR) 40 MG tablet take 1 tablet by mouth once daily 90 tablet 0  . TEMAZEPAM PO Take 15 mg by mouth as needed.     . traMADol-acetaminophen (ULTRACET) 37.5-325 MG tablet take 1 tablet by mouth every 6 hours if needed for pain 360 tablet 1  . VITAMIN D, CHOLECALCIFEROL, PO Take by mouth.    [name] ZETIA 10 MG tablet take 1 tablet by mouth once daily 30 tablet 0  . doxycycline (VIBRA-TABS) 100 MG tablet Take 1 tablet (100 mg total) by mouth 2 (two) times daily. (Patient not taking: Reported on 07/22/2015) 20 tablet 0  . niacin (NIASPAN) 1000 MG CR tablet Take 1 tablet by mouth as directed. Reported on 07/22/2015  0   No facility-administered medications prior to visit.    Allergies  Allergen Reactions  . Brimonidine Tartrate     ALPHAGAN-Shortness of breath  . Brinzolamide     AZOPT- Shortness of breath  . Celecoxib     CELLBREX-confusion  . Codeine     Childhood reaction  . Colchicine     diarrhea  . Diltiazem      leg swelling  . Latanoprost     XALATAN- Shortness of breath  . Rofecoxib      VIOXX- leg swelling  . Timolol Maleate     TIMOPTIC-  Aggravated asthma  . Vancomycin     hives/blisters    ROS As per HPI  PE: Blood pressure 120/75, pulse 68, temperature 98 F (36.7 C), temperature source Oral, resp. rate 16, weight 357 lb 12 oz (162.274 kg), SpO2 97 %. Gen: Alert, well appearing.  Patient is oriented to person, place, time, and situation. FKC:LEXN: no injection, icteris, swelling, or exudate.  EOMI, PERRLA. Mouth: lips without lesion/swelling.  Oral mucosa pink and moist. Oropharynx without erythema, exudate, or swelling.  CV: RRR, 2/6 systolic murmur, no diastolic murmur, no rub or gallop. Chest is clear, no wheezing or rales. Normal symmetric air entry throughout both lung fields. No chest wall deformities or tenderness. EXT: trace to 1+ edema RLL, and 1-2+ pitting edema in LLL.  LABS:  Lab Results  Component Value Date   TSH 2.36 01/28/2014   Lab Results  Component Value Date   WBC 11.0* 12/28/2014   HGB 12.7* 12/28/2014   HCT 38.8* 12/28/2014   MCV 87.0 12/28/2014   PLT 179 12/28/2014   Lab Results  Component Value Date   CREATININE 0.79 12/28/2014   BUN 18 12/28/2014   NA 139 12/28/2014   K 4.6 12/28/2014   CL 105 12/28/2014   CO2 28 12/28/2014   Lab Results  Component Value Date   ALT 13 10/07/2014   AST 15 10/07/2014   ALKPHOS 66 10/07/2014   BILITOT 0.5 10/07/2014   Lab Results  Component Value Date   CHOL 115 10/07/2014   Lab Results  Component Value Date   HDL 61.10 10/07/2014   Lab Results  Component Value Date   LDLCALC 39 10/07/2014   Lab Results  Component Value Date   TRIG 75.0 10/07/2014   Lab Results  Component Value Date   CHOLHDL 2 10/07/2014   Lab Results  Component Value  Date   PSA 0.33 10/07/2014   PSA 0.40 09/25/2013   Lab Results  Component Value Date   HGBA1C 6.1 03/22/2015   IMPRESSION AND PLAN:  1) HTN; The current medical regimen is effective;  continue present plan and medications.  2) Insulin resistance: not taking metformin with any  regularity until just recently. We'll recheck A1c next o/v in 4 mo.  3) Chronic multifactorial DOE (deconditioning, OHS, OSA, chronic diastolic CHF): stable.  Continue all cardiac and pulm meds + CPAP and wt loss efforts. Emphasized dieting/portion control as esp important for wt loss since his chronic pain will not allow him to exercise any.  4) URI with cough/acute bronchitis: slowly resolving.  No meds needed at this time. This plan was discussed with pt and he agrees.  In fact, he is not even wanting to take any symptomatic care at this time for his respiratory symptoms. Signs/symptoms to call or return for were reviewed and pt expressed understanding.  An After Visit Summary was printed and given to the patient.  FOLLOW UP: Return in about 4 months (around 11/19/2015) for annual CPE (fasting).

## 2015-07-23 ENCOUNTER — Telehealth: Payer: Self-pay | Admitting: Cardiology

## 2015-07-23 NOTE — Telephone Encounter (Signed)
Spoke with pt, script is fine, he misunderstood what they told him.

## 2015-07-23 NOTE — Telephone Encounter (Signed)
Pt called in stating that when he went to go pick up his Zetia yesterday, the pharmacy told him that they could not refill this due to the doctor. Please advise pt and what the issue is.   Thanks

## 2015-07-31 ENCOUNTER — Other Ambulatory Visit: Payer: Self-pay | Admitting: Registered Nurse

## 2015-08-09 ENCOUNTER — Telehealth: Payer: Self-pay | Admitting: *Deleted

## 2015-08-09 NOTE — Telephone Encounter (Signed)
Tim [name] is calling about his Ultram being rejected by our office.  I explained to him that our records indicate he was given a 3 month supply on 04/15/15 with one additional refill which should be good through March.  He will check on this.

## 2015-08-10 ENCOUNTER — Telehealth: Payer: Self-pay | Admitting: *Deleted

## 2015-08-10 NOTE — Telephone Encounter (Signed)
Pt is having a temporary placement of spinal cord stimulator and they want okay for pt to hold his eliquis for 2 days prior to procedure. Will forward for dr Stanford Breed review

## 2015-08-10 NOTE — Telephone Encounter (Signed)
Ok to hold eliquis 2 days prior to procedure and resume after when ok with surgeons. Kirk Ruths

## 2015-08-12 NOTE — Telephone Encounter (Signed)
This note was faxed to the number provided

## 2015-08-17 ENCOUNTER — Ambulatory Visit: Payer: Medicare Other | Admitting: Registered Nurse

## 2015-08-18 ENCOUNTER — Encounter: Payer: Medicare Other | Admitting: Registered Nurse

## 2015-08-19 ENCOUNTER — Encounter: Payer: Medicare Other | Attending: Registered Nurse | Admitting: Registered Nurse

## 2015-08-19 ENCOUNTER — Encounter: Payer: Self-pay | Admitting: Registered Nurse

## 2015-08-19 VITALS — BP 162/76 | HR 73 | Resp 14

## 2015-08-19 DIAGNOSIS — M109 Gout, unspecified: Secondary | ICD-10-CM | POA: Insufficient documentation

## 2015-08-19 DIAGNOSIS — G894 Chronic pain syndrome: Secondary | ICD-10-CM | POA: Diagnosis not present

## 2015-08-19 DIAGNOSIS — Z79899 Other long term (current) drug therapy: Secondary | ICD-10-CM

## 2015-08-19 DIAGNOSIS — Z5181 Encounter for therapeutic drug level monitoring: Secondary | ICD-10-CM

## 2015-08-19 DIAGNOSIS — M48061 Spinal stenosis, lumbar region without neurogenic claudication: Secondary | ICD-10-CM

## 2015-08-19 DIAGNOSIS — G609 Hereditary and idiopathic neuropathy, unspecified: Secondary | ICD-10-CM | POA: Insufficient documentation

## 2015-08-19 DIAGNOSIS — E7800 Pure hypercholesterolemia, unspecified: Secondary | ICD-10-CM | POA: Diagnosis not present

## 2015-08-19 DIAGNOSIS — I2510 Atherosclerotic heart disease of native coronary artery without angina pectoris: Secondary | ICD-10-CM | POA: Insufficient documentation

## 2015-08-19 DIAGNOSIS — K219 Gastro-esophageal reflux disease without esophagitis: Secondary | ICD-10-CM | POA: Insufficient documentation

## 2015-08-19 DIAGNOSIS — M79672 Pain in left foot: Secondary | ICD-10-CM | POA: Diagnosis present

## 2015-08-19 DIAGNOSIS — M1990 Unspecified osteoarthritis, unspecified site: Secondary | ICD-10-CM | POA: Diagnosis not present

## 2015-08-19 DIAGNOSIS — J449 Chronic obstructive pulmonary disease, unspecified: Secondary | ICD-10-CM | POA: Insufficient documentation

## 2015-08-19 DIAGNOSIS — M4806 Spinal stenosis, lumbar region: Secondary | ICD-10-CM

## 2015-08-19 MED ORDER — OXYCODONE HCL 10 MG PO TABS: 10.0000 mg | ORAL_TABLET | Freq: Four times a day (QID) | ORAL | Status: DC | PRN

## 2015-08-19 NOTE — Progress Notes (Signed)
Subjective:    Patient ID: Tim [name], male    DOB: 1941/05/13, 75 y.o.   MRN: [id number]  HPI: Tim [name] is a 75 year old male who returns for follow up for chronic pain and medication refill. He says his pain is located in his lower back He rates his pain 4. His current exercise regime is walking daily for 5-10 minutes daily.  S/P Trial Spinal Stimulator Placed on 08/18/15 by Dr. [name]. Steri-strips intact.Tim [name] states "he had a good night's sleep and was pain free.  Pain Inventory Average Pain 8 Pain Right Now 4 My pain is intermittent, sharp, dull, stabbing, tingling and aching  In the last 24 hours, has pain interfered with the following? General activity 8 Relation with others 8 Enjoyment of life 4 What TIME of day is your pain at its worst? night Sleep (in general) Poor  Pain is worse with: walking, bending, standing and some activites Pain improves with: rest, pacing activities, medication and TENS Relief from Meds: 7  Mobility walk without assistance walk with assistance use a cane how many minutes can you walk? 5 ability to climb steps?  no do you drive?  yes  Function retired I need assistance with the following:  household duties and shopping  Neuro/Psych weakness numbness tremor tingling trouble walking  Prior Studies Any changes since last visit?  no  Physicians involved in your care Any changes since last visit?  no   Family History  Problem Relation Age of Onset  . Hypertension Mother   . Coronary artery disease Mother   . Heart attack Mother   . Pulmonary fibrosis Father     asbestosis  . Neuropathy Mother    Social History   Social History  . Marital Status: Married    Spouse Name: [name]  . Number of Children: 0  . Years of Education: 20   Occupational History  . engineering     retired   Social History Main Topics  . Smoking status: Never Smoker   . Smokeless tobacco: Never Used  . Alcohol Use: No  . Drug  Use: No  . Sexual Activity: Not Currently   Other Topics Concern  . None   Social History Narrative   HSG, [name] - BS, [name] - MS-engineering, 2 years on PhD - [name]. Married - '65 - 53yr/divorced; '76- 3 yrs/divorced; '92 . No children. 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 ACP/Living Will - Yes CPR; long-term Mechanical ventilation as long as he was able to cognate; ok for long term artificial nutrition. Precondition being able to cognate and not to have too much pain.    Past Surgical History  Procedure Laterality Date  . Hemorrhoid surgery    . Vitrectomy    . Intraocular lens insertion Bilateral   . Leg surgery Bilateral   . Knee surgery Right   . Traeculectomy Left     eye  . Shoulder arthroscopy  08/30/2011    Procedure: ARTHROSCOPY SHOULDER;  Surgeon: [name] MD;  Location: [facility]  Service: Orthopedics;  Laterality: Right;  Right Shoulder Arthroscopy, Debridement, and Decompression  . Tonsillectomy    . Colonoscopy    . Cholecystectomy    . 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  R big toe surg due to osteoarth  . Colonoscopy [name] 10/30/2012    Procedure: COLONOSCOPY;  Surgeon: CGatha Mayer MD;  Location: [facility];  Service: Endoscopy;  Laterality: [name];  . Cardiac catheterization  1997    Non-obstructive disease  . Cardiac catheterization  12/24/2012    mild < 20% LCx, prox 30% RCA; LVEF 55-65% , moderate pulmonary HTN, moderate AS  . Eye surgery Bilateral cataract  . Amputation Left 04/11/2013    Procedure: AMPUTATION DIGIT Left 3rd toe;  Surgeon: [name], MD;  Location: [place];  Service: Orthopedics;  Laterality: Left;  Left 3rd toe amputation at [facility]  . Left and right heart catheterization with coronary angiogram [name] 12/24/2012    Procedure: LEFT AND RIGHT HEART CATHETERIZATION WITH CORONARY ANGIOGRAM;  Surgeon: [name], MD;  Location: [facility] CATH [facility];  Service: Cardiovascular;  Laterality: [name];  .  Transthoracic echocardiogram  01/2015    No signif change in aortic stenosis (moderate).  Severe LVH w/small LV cavity, EF 60-65%, grade I diast dysfxn.   Past Medical History  Diagnosis Date  . HYPERCHOLESTEROLEMIA-PURE   . GOUT   . OSA on CPAP     8 cm H2O  . Essential hypertension, benign   . VENOUS INSUFFICIENCY   . ASTHMA   . COPD   . VENTRAL HERNIA   . OSTEOARTHRITIS   . CATARACT, HX OF   . PULMONARY HYPERTENSION, HX OF   . Hallux valgus (acquired)   . Obesity, unspecified   . Osteoarthrosis, unspecified whether generalized or localized, unspecified site   . Unspecified glaucoma   . Moderate aortic stenosis     Per 12/2012 echo- (VTI 1.08 cm^2, Vmax 1.11 cm ^2), mild LA dilatation  . Paroxysmal atrial fibrillation ([place]) 2003    Off anticoag for a while due to falls.  Then apixaban started 12/2014.  . Lumbosacral neuritis   . Unspecified hereditary and idiopathic peripheral neuropathy approx age 35    bilat LE's, ? left arm, too.  Feet became progressively numb + left foot pain intermittently.  Pt may be trying a spinal stimulator (as of 05/2015)  . Lumbosacral spondylosis   . CAD (coronary artery disease)     Nonobstructive by 12/2012 cath  . PUD (peptic ulcer disease)   . BPH (benign prostatic hypertrophy)   . HH (hiatus hernia)   . Glaucoma   . GERD (gastroesophageal reflux disease)   . Personal history of colonic adenoma 10/30/2012  . Shortness of breath     with exertion  . Peripheral neuropathy ([place])     [place]redetary; with chronic neuropathic pain (Dr. Ella Bodo)  . Normal memory function 08/2014    Neuropsychological testing ([facility]): no cognitive impairment or sign of neurodegenerative disorder.  Likely has adjustment d/o with mixed anxiety/depressed features and may benefit from low dose SNRI.    . IFG (impaired fasting glucose)     HbA1c jumped from 5.7% to 6.1% 03/2015---started metformin at that time.  . Obesity hypoventilation syndrome (H[facility])      BP 162/76 mmHg  Pulse 73  Resp 14  SpO2 97%  Opioid Risk Score:   Fall Risk Score:  `1  Depression screen PHQ 2/9  Depression screen [facility] 2/9 07/15/2015 03/16/2015 02/16/2015 09/16/2014  Decreased Interest 0 0 0 1  Down, Depressed, Hopeless 0 0 0 0  PHQ - 2 Score 0 0 0 1  Altered sleeping - - - 2  Tired, decreased energy - - - 2  Change in appetite - - - 1  Feeling bad or failure about yourself  - - - 0  Trouble concentrating - - - 0  Moving slowly or fidgety/restless - - - 0  Suicidal thoughts - - - 0  PHQ-9 Score - - - 6     Review of Systems  Constitutional: Positive for diaphoresis.  Respiratory: Positive for apnea and shortness of breath.   Cardiovascular: Positive for leg swelling.  Gastrointestinal: Positive for nausea.  Endocrine:       High blood sugar  All other systems reviewed and are negative.      Objective:   Physical Exam  Constitutional: He is oriented to person, place, and time. He appears well-developed and well-nourished.  HENT:  Head: Normocephalic and atraumatic.  Neck: Normal range of motion. Neck supple.  Cardiovascular: Normal rate and regular rhythm.   Pulmonary/Chest: Effort normal and breath sounds normal.  Musculoskeletal:  Normal Muscle Bulk and Muscle Testing Reveals: Upper Extremities: Right:Decreased ROM 45 Degrees and Muscle Strength 5/5 Left: Full ROM and Muscle Strength 5/5 Back without spinal or paraspinal tenderness Lower Extremities: Full ROM and Muscle Strength 5/5 Arises from chair with ease Narrow Based gait   Neurological: He is alert and oriented to person, place, and time.  Skin: Skin is warm and dry.  Psychiatric: He has a normal mood and affect.  Nursing note and vitals reviewed.         Assessment & Plan:  1. 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  Refilled:Oxycodone 10 mg ---use one every 6 hours as needed for pain.#120. Continue with  Gabapentin.  2. DDD lumbar spine with LBP: Neurology Following. Continue with activity and heat therapy.  S/P Trial Spinal Stimulator Placed 08/18/15 via Dr. [name].  20 minutes of face to face patient care time was spent during this visit. All questions were encouraged and answered.   F/U in 1 month

## 2015-08-25 ENCOUNTER — Other Ambulatory Visit: Payer: Self-pay | Admitting: Family Medicine

## 2015-08-26 ENCOUNTER — Other Ambulatory Visit: Payer: Self-pay | Admitting: Anesthesiology

## 2015-08-26 NOTE — Telephone Encounter (Signed)
RF request for omeprazole LOV: 07/22/15 Next ov: 11/19/15 Last written: 11/19/15 #30 w/ 6RF

## 2015-08-31 ENCOUNTER — Other Ambulatory Visit: Payer: Self-pay | Admitting: Physical Medicine & Rehabilitation

## 2015-08-31 ENCOUNTER — Other Ambulatory Visit: Payer: Self-pay | Admitting: Family Medicine

## 2015-09-01 NOTE — Telephone Encounter (Signed)
RF request for allopurinol LOV: 07/22/15 Next ov: 11/19/15 Last written: 01/22/15 #30 w/ 6RF  Please advise. Thanks.

## 2015-09-02 ENCOUNTER — Other Ambulatory Visit: Payer: Self-pay | Admitting: Pharmacist Clinician (PhC)/ Clinical Pharmacy Specialist

## 2015-09-02 DIAGNOSIS — I4891 Unspecified atrial fibrillation: Secondary | ICD-10-CM

## 2015-09-02 MED ORDER — APIXABAN 5 MG PO TABS: 5.0000 mg | ORAL_TABLET | Freq: Two times a day (BID) | ORAL | Status: DC

## 2015-09-03 ENCOUNTER — Encounter (HOSPITAL_COMMUNITY): Payer: Self-pay

## 2015-09-03 ENCOUNTER — Encounter (HOSPITAL_COMMUNITY)
Admission: RE | Admit: 2015-09-03 | Discharge: 2015-09-03 | Disposition: A | Discharge status: Home / Self Care | Payer: Medicare Other | Source: Ambulatory Visit | Attending: Anesthesiology | Admitting: Anesthesiology

## 2015-09-03 DIAGNOSIS — H409 Unspecified glaucoma: Secondary | ICD-10-CM | POA: Diagnosis not present

## 2015-09-03 DIAGNOSIS — I272 Other secondary pulmonary hypertension: Secondary | ICD-10-CM | POA: Diagnosis not present

## 2015-09-03 DIAGNOSIS — K219 Gastro-esophageal reflux disease without esophagitis: Secondary | ICD-10-CM | POA: Insufficient documentation

## 2015-09-03 DIAGNOSIS — G8929 Other chronic pain: Secondary | ICD-10-CM | POA: Insufficient documentation

## 2015-09-03 DIAGNOSIS — Z79899 Other long term (current) drug therapy: Secondary | ICD-10-CM | POA: Diagnosis not present

## 2015-09-03 DIAGNOSIS — I480 Paroxysmal atrial fibrillation: Secondary | ICD-10-CM | POA: Diagnosis not present

## 2015-09-03 DIAGNOSIS — Z01812 Encounter for preprocedural laboratory examination: Secondary | ICD-10-CM | POA: Insufficient documentation

## 2015-09-03 DIAGNOSIS — M545 Low back pain: Secondary | ICD-10-CM | POA: Diagnosis not present

## 2015-09-03 DIAGNOSIS — I2510 Atherosclerotic heart disease of native coronary artery without angina pectoris: Secondary | ICD-10-CM | POA: Diagnosis not present

## 2015-09-03 DIAGNOSIS — I350 Nonrheumatic aortic (valve) stenosis: Secondary | ICD-10-CM | POA: Diagnosis not present

## 2015-09-03 DIAGNOSIS — Z01818 Encounter for other preprocedural examination: Secondary | ICD-10-CM | POA: Insufficient documentation

## 2015-09-03 DIAGNOSIS — J45909 Unspecified asthma, uncomplicated: Secondary | ICD-10-CM | POA: Insufficient documentation

## 2015-09-03 DIAGNOSIS — Z7902 Long term (current) use of antithrombotics/antiplatelets: Secondary | ICD-10-CM | POA: Diagnosis not present

## 2015-09-03 DIAGNOSIS — E785 Hyperlipidemia, unspecified: Secondary | ICD-10-CM | POA: Insufficient documentation

## 2015-09-03 DIAGNOSIS — I10 Essential (primary) hypertension: Secondary | ICD-10-CM | POA: Insufficient documentation

## 2015-09-03 DIAGNOSIS — G4733 Obstructive sleep apnea (adult) (pediatric): Secondary | ICD-10-CM | POA: Diagnosis not present

## 2015-09-03 DIAGNOSIS — Z7984 Long term (current) use of oral hypoglycemic drugs: Secondary | ICD-10-CM | POA: Insufficient documentation

## 2015-09-03 DIAGNOSIS — Z89422 Acquired absence of other left toe(s): Secondary | ICD-10-CM | POA: Diagnosis not present

## 2015-09-03 LAB — CBC
HCT: 37.1 % — ABNORMAL LOW (ref 39.0–52.0)
Hemoglobin: 12.6 g/dL — ABNORMAL LOW (ref 13.0–17.0)
MCH: 30 pg (ref 26.0–34.0)
MCHC: 34 g/dL (ref 30.0–36.0)
MCV: 88.3 fL (ref 78.0–100.0)
Platelets: 146 K/uL — ABNORMAL LOW (ref 150–400)
RBC: 4.2 MIL/uL — ABNORMAL LOW (ref 4.22–5.81)
RDW: 14.3 % (ref 11.5–15.5)
WBC: 8.2 K/uL (ref 4.0–10.5)

## 2015-09-03 LAB — SURGICAL PCR SCREEN
MRSA, PCR: NEGATIVE
Staphylococcus aureus: POSITIVE — AB

## 2015-09-03 NOTE — Progress Notes (Signed)
Mupirocin Ointment Rx called into [facility] in [place] for positive PCR of Staph. Pt notified and voiced understanding.

## 2015-09-03 NOTE — Progress Notes (Signed)
Sleep study over 10 yrs ago in  [place].  Setting is 8  Will be stopping his Eloquis on March 7th per MD"s order.  Dr. Stanford Breed is cardio.  Currently denies any cardiac issues. PCP is Dr. Ernestine [facility] in [place]. Pulmonary is Dr. Parke Simmers in [place] Doesn't check his blood sugar.  Has no machine.  But HGB A1C back in Sept was 6.1

## 2015-09-03 NOTE — Pre-Procedure Instructions (Signed)
Skip Litke Reller  09/03/2015      [facility], [place] [address] Phone: [phone] Fax: [phone]    Your procedure is scheduled on March 10th, Friday   Report to [facility] Admitting at 7:30 AM             (Posted surgery time 9:37 am - 11:13 am)   Call this number if you have problems the morning of surgery:  [phone]   Remember:  Do not eat food or drink liquids after midnight Thursday.   Take these medicines the morning of surgery with A SIP OF WATER : Proscar, Gabapentin, Metoprolol, Omeprazole, pain medication.              Please use your inhalers the morning of surgery.               4-5 days prior to surgery, STOP taking any herbal medications, vitamins, supplements, anti-inflammatories.              DO NOT TAKE your diabetes medication the MORNING OF SURGERY   Do not wear jewelry - NO rings or watches.  Do not wear lotions or colognes.    You may NOT wear deodorant the day of surgery.              Men may shave face and neck.  Do not bring valuables to the hospital.  [name] is not responsible for any belongings or valuables.  Contacts, dentures or bridgework may not be worn into surgery.  Leave your suitcase in the car.  After surgery it may be brought to your room. For patients admitted to the hospital, discharge time will be determined by your treatment team.  Patients discharged the day of surgery will not be allowed to drive home.   Name and phone number of your driver:     Please read over the following fact sheets that you were given. Pain Booklet and Surgical Site Infection Prevention

## 2015-09-04 LAB — HEMOGLOBIN A1C
HEMOGLOBIN A1C: 6 % — AB (ref 4.8–5.6)
Mean Plasma Glucose: 126 mg/dL

## 2015-09-07 NOTE — Progress Notes (Addendum)
Anesthesia Chart Review:  Pt is a 75 year old male scheduled for lumbar spinal cord stimulator insertion on 09/10/2015 with Dr. Maryjean Ka.   Cardiologist is Dr. Kirk Ruths who is aware of upcoming procedure and ok'd stopping eliquis. PCP is Dr. Shawnie Dapper.   PMH includes:  CAD (nonobstructive on 2014 cath), PAF, aortic stenosis, pulmonary HTN, OSA (on CPAP), hyperlipidemia, HTN, asthma, glaucoma, impaired fasting glucose, GERD. Never smoker. BMI 47. 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 S/p R shoulder arthroscopy 08/30/11.   Medications include: albuterol, eliquis, zetia, advair, metformin, metoprolol, prilosec, potassium, crestor. Pt to stop eliquis 09/07/15.   Preoperative labs reviewed.  HgbA1c 6.0. CMET not obtained for some reason. Will get DOS.   EKG 12/02/14: NSR. RBBB.   Echo 01/14/15:  - Left ventricle: The cavity size is small. Wall thickness was increased in a pattern of severe LVH. Systolic function was normal. The estimated ejection fraction was in the range of 60% to 65%. Doppler parameters are consistent with abnormal left ventricular relaxation (grade 1 diastolic dysfunction). The E/e&' ratio is >15, suggesting elevated LV filling pressure. - Aortic valve: Heavily calcified aortic valve -the is at least moderate to severe aortic stenosis. Mean gradient (S): 28 mm Hg. Peak gradient (S): 47 mm Hg. Valve area (VTI): 1.01 cm^2. Valve area (Vmax): 1.05 cm^2. Valve area (Vmean): 0.96 cm^2. - Aorta: Aortic root dimension: 40 mm (ED). - Aortic root: The aortic root is mildly dilated. - Mitral valve: Moderately calcified annulus. Mild regurgitation. Valve area by pressure half-time: 1.62 cm^2. Valve area by continuity equation (using LVOT flow): 1.78 cm^2. - Left atrium: Moderately dilated at 44 ml/m2. - Right atrium: The atrium was mildly dilated. - Inferior vena cava: The vessel was normal in size. The respirophasic diameter changes were in the normal range (= 50%), consistent with normal  central venous pressure. - Impressions: Compared to the prior study in 2015, there is little change inthe degree of aortic stenosis which is moderate.  Cardiac cath 12/24/12:  1. Mild nonobstructive CAD (Om 20%, RCA tandem 30%) 2. Normal LV function. 3. Moderate pulmonary HTN. 4. Moderate aortic stenosis  If labs acceptable DOS, I anticipate pt can proceed with surgery as scheduled.   Willeen Cass, FNP-BC [name]/Anesthesiology Phone: [phone] 09/07/2015 2:20 PM

## 2015-09-08 ENCOUNTER — Other Ambulatory Visit: Payer: Self-pay | Admitting: Registered Nurse

## 2015-09-09 MED ORDER — DEXTROSE 5 % IV SOLN
3.0000 g | INTRAVENOUS | Status: AC
  Administered 2015-09-10: 3 g via INTRAVENOUS
  Filled 2015-09-09: qty 3000

## 2015-09-09 NOTE — H&P (Signed)
Tim [name] is an 75 y.o. male.   Chief Complaint: back pain, diabetic peripheral neuropathy HPI: 75 year old right-handed gentleman, referred to me by Dr. Nonnie Done for consideration of permanent spinal cord stem later implant.  [name] reports a long-standing history of diabetic neuropathy, but currently is quite painful in both his feet, left worse than right, and radiates to about the level of his tibia tubercle.  He is starting to get some neuropathic symptoms in his fingertips.  He does not check his blood sugars every day, but reports when it does get checked it's usually at the high end of normal.  He reports his A1c is typically within more normal limits.  Other notable medical history includes paroxysmal A. fib for which he is currently anticoagulated with L Quist.  He is morbidly obese, has obstructive sleep apnea.  He uses C Papp without any oxygen bleed in, AP at 8.   Secondary pain sources is back, without constant radiculopathy but some intermittent radicular symptoms.  Both his peripheral neuropathy, and his back pain of the evaluated by a neurologist, who referred him for neurosurgical consultation.  He saw my partner Dr. Annette Stable in 2015, and with multilevel, moderate disease was not deemed to be a good surgical candidate.  In terms of managing his back pain, patient is trialed some physical therapy, injections, medication management.  He ultimately trialed a spinal cord stimulator.  He reports near complete relief of his peripheral neuropathic symptoms, and only modest relief of his back pain.  He reports that with some lifestyle modifications he feels like he could tolerate his back pain but felt a great deal of relief from his peripheral neuropathic symptoms.  With a total overview of better than 50% pain relief, the patient's considered to be a good candidate for permanent implantation, and after [name] is referred him to me for such.   Past Medical History  Diagnosis Date   . HYPERCHOLESTEROLEMIA-PURE   . GOUT   . OSA on CPAP     8 cm H2O  . Essential hypertension, benign   . VENOUS INSUFFICIENCY   . ASTHMA   . COPD   . VENTRAL HERNIA   . OSTEOARTHRITIS   . CATARACT, HX OF   . PULMONARY HYPERTENSION, HX OF   . Hallux valgus (acquired)   . Obesity, unspecified   . Osteoarthrosis, unspecified whether generalized or localized, unspecified site   . Unspecified glaucoma   . Moderate aortic stenosis     Per 12/2012 echo- (VTI 1.08 cm^2, Vmax 1.11 cm ^2), mild LA dilatation  . Paroxysmal atrial fibrillation ([place]) 2003    Off anticoag for a while due to falls.  Then apixaban started 12/2014.  . Lumbosacral neuritis   . Unspecified hereditary and idiopathic peripheral neuropathy approx age 89    bilat LE's, ? left arm, too.  Feet became progressively numb + left foot pain intermittently.  Pt may be trying a spinal stimulator (as of 05/2015)  . Lumbosacral spondylosis   . CAD (coronary artery disease)     Nonobstructive by 12/2012 cath  . PUD (peptic ulcer disease)   . BPH (benign prostatic hypertrophy)   . HH (hiatus hernia)   . Glaucoma   . GERD (gastroesophageal reflux disease)   . Personal history of colonic adenoma 10/30/2012  . Shortness of breath     with exertion  . Peripheral neuropathy ([place])     [place]; with chronic neuropathic pain (Dr. Ella Bodo)  . Normal memory function  08/2014    Neuropsychological testing ([facility]): no cognitive impairment or sign of neurodegenerative disorder.  Likely has adjustment d/o with mixed anxiety/depressed features and may benefit from low dose SNRI.    . IFG (impaired fasting glucose)     HbA1c jumped from 5.7% to 6.1% 03/2015---started metformin at that time.  . Obesity hypoventilation syndrome [facility])     Past Surgical History  Procedure Laterality Date  . Hemorrhoid surgery    . Vitrectomy    . Intraocular lens insertion Bilateral   . Leg surgery Bilateral     lenghtening   . Knee  surgery Right   . Traeculectomy Left     eye  . Shoulder arthroscopy  08/30/2011    Procedure: ARTHROSCOPY SHOULDER;  Surgeon: [name], MD;  Location: [facility];  Service: Orthopedics;  Laterality: Right;  Right Shoulder Arthroscopy, Debridement, and Decompression  . Tonsillectomy    . Colonoscopy    . Cholecystectomy    . 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  R big toe surg due to osteoarth  . Colonoscopy [name] 10/30/2012    Procedure: COLONOSCOPY;  Surgeon: [name], MD;  Location: [facility];  Service: Endoscopy;  Laterality: [name];  . Cardiac catheterization  1997    Non-obstructive disease  . Cardiac catheterization  12/24/2012    mild < 20% LCx, prox 30% RCA; LVEF 55-65% , moderate pulmonary HTN, moderate AS  . Eye surgery Bilateral cataract  . Amputation Left 04/11/2013    Procedure: AMPUTATION DIGIT Left 3rd toe;  Surgeon: [name], MD;  Location: [facility];  Service: Orthopedics;  Laterality: Left;  Left 3rd toe amputation at [facility]  . Left and right heart catheterization with coronary angiogram [name] 12/24/2012    Procedure: LEFT AND RIGHT HEART CATHETERIZATION WITH CORONARY ANGIOGRAM;  Surgeon: [name], MD;  Location: [facility] C[facility] LAB;  Service: Cardiovascular;  Laterality: [name];  . Transthoracic echocardiogram  01/2015    No signif change in aortic stenosis (moderate).  Severe LVH w/small LV cavity, EF 60-65%, grade I diast dysfxn.    Family History  Problem Relation Age of Onset  . Hypertension Mother   . Coronary artery disease Mother   . Heart attack Mother   . Pulmonary fibrosis Father     asbestosis  . Neuropathy Mother    Social History:  reports that he has never smoked. He has never used smokeless tobacco. He reports that he does not drink alcohol or use illicit drugs.  Allergies:  Allergies  Allergen Reactions  . Brimonidine Tartrate Shortness Of Breath    Alphagan-Shortness of breath  . Brinzolamide Shortness Of Breath    AZOPT- Shortness of  breath  . Latanoprost Shortness Of Breath    XALATAN- Shortness of breath  . Timolol Maleate Shortness Of Breath and Other (See Comments)    TIMOPTIC- Aggravated asthma  . Diltiazem Swelling     leg swelling  . Rofecoxib Swelling     VIOXX- leg swelling  . Vancomycin Hives and Other (See Comments)    Possible "Red Man Syndrome"? > hives/blisters  . Codeine Other (See Comments)    Childhood reaction  . Celecoxib Other (See Comments)    CELLBREX-confusion  . Colchicine Diarrhea    diarrhea    Medications Prior to Admission  Medication Sig Dispense Refill  . albuterol (PROVENTIL HFA;VENTOLIN HFA) 108 (90 BASE) MCG/ACT inhaler Inhale 2 puffs into the lungs 4 (four) times daily as needed for  wheezing or shortness of breath.    . allopurinol (ZYLOPRIM) 300 MG tablet take 1 tablet by mouth once daily with food 30 tablet 11  . apixaban (ELIQUIS) 5 MG TABS tablet Take 1 tablet (5 mg total) by mouth 2 (two) times daily. 180 tablet 1  . B Complex-C (B-COMPLEX WITH VITAMIN C) tablet Take 1 tablet by mouth daily.    . clotrimazole-betamethasone (LOTRISONE) cream Apply 1 application topically 2 (two) times daily. 45 g 6  . ezetimibe (ZETIA) 10 MG tablet Take 1 tablet (10 mg total) by mouth daily. Please schedule appointment for refills. 30 tablet 0  . finasteride (PROSCAR) 5 MG tablet take 1 tablet by mouth once daily 90 tablet 1  . fluticasone (FLONASE) 50 MCG/ACT nasal spray Place 2 sprays into both nostrils as needed for allergies or rhinitis.    . Fluticasone-Salmeterol (ADVAIR) 250-50 MCG/DOSE AEPB Inhale 1 puff into the lungs daily as needed (for nasal congestion).     . gabapentin (NEURONTIN) 600 MG tablet take 1 tablet by mouth four times a day 120 tablet 3  . metFORMIN (GLUCOPHAGE) 500 MG tablet Take 1 tablet (500 mg total) by mouth 2 (two) times daily with a meal. 60 tablet 4  . metoprolol succinate (TOPROL XL) 25 MG 24 hr tablet Take 1 tablet (25 mg total) by mouth daily. 90 tablet 3  .  Multiple Vitamin (MULTIVITAMIN) capsule Take 1 capsule by mouth daily.    . Niacin CR 1000 MG TBCR Take 1 tablet (1,000 mg total) by mouth 2 (two) times daily. 180 each 3  . omeprazole (PRILOSEC) 10 MG capsule take 1 capsule by mouth once daily 30 capsule 11  . Oxycodone HCl 10 MG TABS Take 1 tablet (10 mg total) by mouth 4 (four) times daily as needed. 120 tablet 0  . potassium chloride SA (K-DUR,KLOR-CON) 20 MEQ tablet Take 1 tablet (20 mEq total) by mouth daily. 90 tablet 1  . rosuvastatin (CRESTOR) 40 MG tablet take 1 tablet by mouth once daily 90 tablet 0  . traMADol-acetaminophen (ULTRACET) 37.5-325 MG tablet take 1 tablet by mouth every 6 hours if needed for pain 120 tablet 0  . VITAMIN D, CHOLECALCIFEROL, PO Take 1 tablet by mouth daily.     . metoprolol succinate (TOPROL-XL) 50 MG 24 hr tablet Take with or immediately following a meal. (Patient not taking: Reported on 09/02/2015) 90 tablet 3  . temazepam (RESTORIL) 15 MG capsule Take 15 mg by mouth at bedtime as needed for sleep.      Results for orders placed or performed during the hospital encounter of 09/10/15 (from the past 48 hour(s))  Comprehensive metabolic panel     Status: Abnormal   Collection Time: 09/10/15  7:35 AM  Result Value Ref Range   Sodium 140 135 - 145 mmol/L   Potassium 4.3 3.5 - 5.1 mmol/L   Chloride 102 101 - 111 mmol/L   CO2 23 22 - 32 mmol/L   Glucose, Bld 124 (H) 65 - 99 mg/dL   BUN 19 6 - 20 mg/dL   Creatinine, Ser 0.77 0.61 - 1.24 mg/dL   Calcium 9.6 8.9 - 10.3 mg/dL   Total Protein 6.7 6.5 - 8.1 g/dL   Albumin 3.9 3.5 - 5.0 g/dL   AST 23 15 - 41 U/L   ALT 21 17 - 63 U/L   Alkaline Phosphatase 64 38 - 126 U/L   Total Bilirubin 0.8 0.3 - 1.2 mg/dL   GFR calc non Af  Amer >60 >60 mL/min   GFR calc Af Amer >60 >60 mL/min    Comment: (NOTE) The eGFR has been calculated using the CKD EPI equation. This calculation has not been validated in all clinical situations. eGFR's persistently <60 mL/min  signify possible Chronic Kidney Disease.    Anion gap 15 5 - 15  Glucose, capillary     Status: Abnormal   Collection Time: 09/10/15  7:52 AM  Result Value Ref Range   Glucose-Capillary 116 (H) 65 - 99 mg/dL   No results found.  Review of Systems  Constitutional: Negative.   HENT: Negative.   Eyes: Negative.   Respiratory: Negative.   Cardiovascular: Negative.   Gastrointestinal: Negative.   Genitourinary: Negative.   Musculoskeletal: Positive for back pain and joint pain. Negative for myalgias, falls and neck pain.  Skin: Negative.   Neurological: Negative.   Endo/Heme/Allergies: Negative.   Psychiatric/Behavioral: Negative.     Blood pressure 133/42, pulse 71, temperature 98.4 F (36.9 C), temperature source Oral, resp. rate 18, height 6' 1"  (1.854 m), weight 158.759 kg (350 lb), SpO2 96 %. Physical Exam  Constitutional: He is oriented to person, place, and time. He appears well-developed and well-nourished.  HENT:  Head: Normocephalic and atraumatic.  Eyes: Conjunctivae and EOM are normal. Pupils are equal, round, and reactive to light.  Cardiovascular: Normal rate and regular rhythm.   Respiratory: Effort normal.  Musculoskeletal: Normal range of motion.  Neurological: He is alert and oriented to person, place, and time.  Skin: Skin is warm and dry.  Psychiatric: He has a normal mood and affect. Thought content normal.     Assessment/Plan Diabetic peripheral neuropathy Lumbar spondylosis, associated back pain Chronic pain  PLAN: SCS permanent, [employer]  Bonna Gains, MD 09/10/2015, 9:26 AM

## 2015-09-10 ENCOUNTER — Ambulatory Visit (HOSPITAL_COMMUNITY): Payer: Medicare Other | Admitting: Emergency Medicine

## 2015-09-10 ENCOUNTER — Ambulatory Visit (HOSPITAL_COMMUNITY): Payer: Medicare Other

## 2015-09-10 ENCOUNTER — Ambulatory Visit (HOSPITAL_COMMUNITY)
Admission: RE | Admit: 2015-09-10 | Discharge: 2015-09-10 | Disposition: A | Discharge status: Home / Self Care | Payer: Medicare Other | Source: Ambulatory Visit | Attending: Anesthesiology | Admitting: Anesthesiology

## 2015-09-10 ENCOUNTER — Ambulatory Visit (HOSPITAL_COMMUNITY): Payer: Medicare Other | Admitting: Anesthesiology

## 2015-09-10 ENCOUNTER — Encounter (HOSPITAL_COMMUNITY): Payer: Self-pay | Admitting: *Deleted

## 2015-09-10 ENCOUNTER — Encounter (HOSPITAL_COMMUNITY)
Admission: RE | Disposition: A | Discharge status: Home / Self Care | Payer: Self-pay | Source: Ambulatory Visit | Attending: Anesthesiology

## 2015-09-10 DIAGNOSIS — Z7901 Long term (current) use of anticoagulants: Secondary | ICD-10-CM | POA: Insufficient documentation

## 2015-09-10 DIAGNOSIS — G8929 Other chronic pain: Secondary | ICD-10-CM | POA: Insufficient documentation

## 2015-09-10 DIAGNOSIS — Z6841 Body Mass Index (BMI) 40.0 and over, adult: Secondary | ICD-10-CM | POA: Diagnosis not present

## 2015-09-10 DIAGNOSIS — Z89411 Acquired absence of right great toe: Secondary | ICD-10-CM | POA: Insufficient documentation

## 2015-09-10 DIAGNOSIS — Z89422 Acquired absence of other left toe(s): Secondary | ICD-10-CM | POA: Diagnosis not present

## 2015-09-10 DIAGNOSIS — M545 Low back pain: Secondary | ICD-10-CM | POA: Insufficient documentation

## 2015-09-10 DIAGNOSIS — Z7984 Long term (current) use of oral hypoglycemic drugs: Secondary | ICD-10-CM | POA: Diagnosis not present

## 2015-09-10 DIAGNOSIS — M4726 Other spondylosis with radiculopathy, lumbar region: Secondary | ICD-10-CM | POA: Insufficient documentation

## 2015-09-10 DIAGNOSIS — Z79899 Other long term (current) drug therapy: Secondary | ICD-10-CM | POA: Diagnosis not present

## 2015-09-10 DIAGNOSIS — E662 Morbid (severe) obesity with alveolar hypoventilation: Secondary | ICD-10-CM | POA: Insufficient documentation

## 2015-09-10 DIAGNOSIS — E7800 Pure hypercholesterolemia, unspecified: Secondary | ICD-10-CM | POA: Insufficient documentation

## 2015-09-10 DIAGNOSIS — I2510 Atherosclerotic heart disease of native coronary artery without angina pectoris: Secondary | ICD-10-CM | POA: Insufficient documentation

## 2015-09-10 DIAGNOSIS — I480 Paroxysmal atrial fibrillation: Secondary | ICD-10-CM | POA: Diagnosis not present

## 2015-09-10 DIAGNOSIS — M109 Gout, unspecified: Secondary | ICD-10-CM | POA: Diagnosis not present

## 2015-09-10 DIAGNOSIS — K219 Gastro-esophageal reflux disease without esophagitis: Secondary | ICD-10-CM | POA: Diagnosis not present

## 2015-09-10 DIAGNOSIS — I10 Essential (primary) hypertension: Secondary | ICD-10-CM | POA: Diagnosis not present

## 2015-09-10 DIAGNOSIS — N400 Enlarged prostate without lower urinary tract symptoms: Secondary | ICD-10-CM | POA: Diagnosis not present

## 2015-09-10 DIAGNOSIS — Z419 Encounter for procedure for purposes other than remedying health state, unspecified: Secondary | ICD-10-CM

## 2015-09-10 DIAGNOSIS — E1142 Type 2 diabetes mellitus with diabetic polyneuropathy: Secondary | ICD-10-CM | POA: Insufficient documentation

## 2015-09-10 DIAGNOSIS — J45909 Unspecified asthma, uncomplicated: Secondary | ICD-10-CM | POA: Insufficient documentation

## 2015-09-10 DIAGNOSIS — J449 Chronic obstructive pulmonary disease, unspecified: Secondary | ICD-10-CM | POA: Insufficient documentation

## 2015-09-10 DIAGNOSIS — I4891 Unspecified atrial fibrillation: Secondary | ICD-10-CM

## 2015-09-10 HISTORY — PX: SPINAL CORD STIMULATOR INSERTION: SHX5378

## 2015-09-10 LAB — GLUCOSE, CAPILLARY
Glucose-Capillary: 116 mg/dL — ABNORMAL HIGH (ref 65–99)
Glucose-Capillary: 117 mg/dL — ABNORMAL HIGH (ref 65–99)

## 2015-09-10 LAB — COMPREHENSIVE METABOLIC PANEL
ALBUMIN: 3.9 g/dL (ref 3.5–5.0)
ALT: 21 U/L (ref 17–63)
AST: 23 U/L (ref 15–41)
Alkaline Phosphatase: 64 U/L (ref 38–126)
Anion gap: 15 (ref 5–15)
BUN: 19 mg/dL (ref 6–20)
CHLORIDE: 102 mmol/L (ref 101–111)
CO2: 23 mmol/L (ref 22–32)
CREATININE: 0.77 mg/dL (ref 0.61–1.24)
Calcium: 9.6 mg/dL (ref 8.9–10.3)
GFR calc non Af Amer: >60 mL/min (ref >60)
GLUCOSE: 124 mg/dL — AB (ref 65–99)
Potassium: 4.3 mmol/L (ref 3.5–5.1)
SODIUM: 140 mmol/L (ref 135–145)
Total Bilirubin: 0.8 mg/dL (ref 0.3–1.2)
Total Protein: 6.7 g/dL (ref 6.5–8.1)

## 2015-09-10 SURGERY — INSERTION, SPINAL CORD STIMULATOR, LUMBAR
Anesthesia: Monitor Anesthesia Care

## 2015-09-10 MED ORDER — OXYCODONE HCL 10 MG PO TABS: 10.0000 mg | ORAL_TABLET | ORAL | Status: DC | PRN

## 2015-09-10 MED ORDER — LACTATED RINGERS IV SOLN
INTRAVENOUS | Status: DC | PRN
  Administered 2015-09-10 (×2): via INTRAVENOUS

## 2015-09-10 MED ORDER — 0.9 % SODIUM CHLORIDE (POUR BTL) OPTIME
TOPICAL | Status: DC | PRN
  Administered 2015-09-10: 1000 mL

## 2015-09-10 MED ORDER — FENTANYL CITRATE (PF) 250 MCG/5ML IJ SOLN
INTRAMUSCULAR | Status: DC | PRN
  Administered 2015-09-10 (×2): 50 ug via INTRAVENOUS

## 2015-09-10 MED ORDER — PROPOFOL 500 MG/50ML IV EMUL
INTRAVENOUS | Status: DC | PRN
  Administered 2015-09-10: 75 ug/kg/min via INTRAVENOUS

## 2015-09-10 MED ORDER — CEPHALEXIN 500 MG PO CAPS: 500.0000 mg | ORAL_CAPSULE | Freq: Three times a day (TID) | ORAL | Status: DC

## 2015-09-10 MED ORDER — APIXABAN 5 MG PO TABS: 5.0000 mg | ORAL_TABLET | Freq: Two times a day (BID) | ORAL | Status: DC

## 2015-09-10 MED ORDER — SODIUM CHLORIDE 0.9 % IR SOLN
Status: DC | PRN
  Administered 2015-09-10: 10:00:00

## 2015-09-10 MED ORDER — FENTANYL CITRATE (PF) 250 MCG/5ML IJ SOLN
INTRAMUSCULAR | Status: AC
  Filled 2015-09-10: qty 5

## 2015-09-10 MED ORDER — PROPOFOL 10 MG/ML IV BOLUS
INTRAVENOUS | Status: DC | PRN
  Administered 2015-09-10 (×2): 10 mg via INTRAVENOUS

## 2015-09-10 MED ORDER — BUPIVACAINE-EPINEPHRINE (PF) 0.5% -1:200000 IJ SOLN
INTRAMUSCULAR | Status: DC | PRN
  Administered 2015-09-10: 20 mL via PERINEURAL
  Administered 2015-09-10: 30 mL via PERINEURAL

## 2015-09-10 MED ORDER — PROPOFOL 10 MG/ML IV BOLUS
INTRAVENOUS | Status: AC
  Filled 2015-09-10: qty 20

## 2015-09-10 SURGICAL SUPPLY — 69 items
ADH SKN CLS APL DERMABOND .7 (GAUZE/BANDAGES/DRESSINGS)
ANCH LD 4 SETX2 CLIK X (Stimulator) ×1 IMPLANT
ANCHOR CLIK X NEURO (Stimulator) ×1 IMPLANT
APL SKNCLS STERI-STRIP NONHPOA (GAUZE/BANDAGES/DRESSINGS)
BAG DECANTER FOR FLEXI CONT (MISCELLANEOUS) ×2 IMPLANT
BENZOIN TINCTURE PRP APPL 2/3 (GAUZE/BANDAGES/DRESSINGS) IMPLANT
BINDER ABDOMINAL 12 ML 46-62 (SOFTGOODS) ×2 IMPLANT
BLADE CLIPPER SURG (BLADE) IMPLANT
CABLE/EXTENSION OR 1X16 61 (CABLE) ×2 IMPLANT
CHLORAPREP W/TINT 26ML (MISCELLANEOUS) ×2 IMPLANT
CLIP TI WIDE RED SMALL 6 (CLIP) IMPLANT
DERMABOND ADVANCED (GAUZE/BANDAGES/DRESSINGS)
DERMABOND ADVANCED .7 DNX12 (GAUZE/BANDAGES/DRESSINGS) IMPLANT
DRAPE C-ARM 42X72 X-RAY (DRAPES) ×2 IMPLANT
DRAPE C-ARMOR (DRAPES) ×2 IMPLANT
DRAPE LAPAROTOMY 100X72X124 (DRAPES) ×2 IMPLANT
DRAPE POUCH INSTRU U-SHP 10X18 (DRAPES) ×2 IMPLANT
DRAPE SURG 17X23 STRL (DRAPES) ×2 IMPLANT
DRSG OPSITE POSTOP 3X4 (GAUZE/BANDAGES/DRESSINGS) ×1 IMPLANT
DRSG OPSITE POSTOP 4X6 (GAUZE/BANDAGES/DRESSINGS) ×1 IMPLANT
ELECT REM PT RETURN 9FT ADLT (ELECTROSURGICAL) ×2
ELECTRODE REM PT RTRN 9FT ADLT (ELECTROSURGICAL) ×1 IMPLANT
GAUZE SPONGE 4X4 16PLY XRAY LF (GAUZE/BANDAGES/DRESSINGS) ×2 IMPLANT
GLOVE BIOGEL PI IND STRL 7.5 (GLOVE) ×1 IMPLANT
GLOVE BIOGEL PI INDICATOR 7.5 (GLOVE) ×1
GLOVE ECLIPSE 7.5 STRL STRAW (GLOVE) ×2 IMPLANT
GLOVE EXAM NITRILE LRG STRL (GLOVE) IMPLANT
GLOVE EXAM NITRILE MD LF STRL (GLOVE) IMPLANT
GLOVE EXAM NITRILE XL STR (GLOVE) IMPLANT
GLOVE EXAM NITRILE XS STR PU (GLOVE) IMPLANT
GLOVE INDICATOR 7.0 STRL GRN (GLOVE) ×2 IMPLANT
GOWN STRL REUS W/ TWL LRG LVL3 (GOWN DISPOSABLE) IMPLANT
GOWN STRL REUS W/ TWL XL LVL3 (GOWN DISPOSABLE) IMPLANT
GOWN STRL REUS W/TWL 2XL LVL3 (GOWN DISPOSABLE) IMPLANT
GOWN STRL REUS W/TWL LRG LVL3 (GOWN DISPOSABLE) ×2
GOWN STRL REUS W/TWL XL LVL3 (GOWN DISPOSABLE)
IPG PRECISION SPECTRA (Stimulator) ×1 IMPLANT
KIT BASIN OR (CUSTOM PROCEDURE TRAY) ×2 IMPLANT
KIT CHARGING (KITS) ×1
KIT CHARGING PRECISION NEURO (KITS) IMPLANT
KIT PAT PROGRAM FREELINK (KITS) IMPLANT
KIT ROOM TURNOVER OR (KITS) ×2 IMPLANT
KIT SPLITTER 30CM 2X8 (Stimulator) ×2 IMPLANT
LEAD KIT CONTACT INFINION 16 (Stimulator) ×2 IMPLANT
LIQUID BAND (GAUZE/BANDAGES/DRESSINGS) ×3 IMPLANT
NDL 18GX1X1/2 (RX/OR ONLY) (NEEDLE) IMPLANT
NDL HYPO 25X1 1.5 SAFETY (NEEDLE) ×1 IMPLANT
NEEDLE 18GX1X1/2 (RX/OR ONLY) (NEEDLE) IMPLANT
NEEDLE HYPO 25X1 1.5 SAFETY (NEEDLE) ×2 IMPLANT
NS IRRIG 1000ML POUR BTL (IV SOLUTION) ×2 IMPLANT
PACK LAMINECTOMY NEURO (CUSTOM PROCEDURE TRAY) ×2 IMPLANT
PAD ARMBOARD 7.5X6 YLW CONV (MISCELLANEOUS) ×2 IMPLANT
REMOTE CONTROL KIT (KITS) ×2
SPONGE LAP 4X18 X RAY DECT (DISPOSABLE) ×2 IMPLANT
SPONGE SURGIFOAM ABS GEL SZ50 (HEMOSTASIS) IMPLANT
STAPLER SKIN PROX WIDE 3.9 (STAPLE) ×2 IMPLANT
STRIP CLOSURE SKIN 1/2X4 (GAUZE/BANDAGES/DRESSINGS) IMPLANT
SUT MNCRL AB 4-0 PS2 18 (SUTURE) ×1 IMPLANT
SUT SILK 0 (SUTURE) ×2
SUT SILK 0 MO-6 18XCR BRD 8 (SUTURE) ×1 IMPLANT
SUT SILK 0 TIES 10X30 (SUTURE) IMPLANT
SUT SILK 2 0 TIES 10X30 (SUTURE) IMPLANT
SUT VIC AB 2-0 CP2 18 (SUTURE) ×4 IMPLANT
SYR EPIDURAL 5ML GLASS (SYRINGE) ×2 IMPLANT
SYRINGE 10CC LL (SYRINGE) ×1 IMPLANT
TOWEL OR 17X24 6PK STRL BLUE (TOWEL DISPOSABLE) ×2 IMPLANT
TOWEL OR 17X26 10 PK STRL BLUE (TOWEL DISPOSABLE) ×2 IMPLANT
WATER STERILE IRR 1000ML POUR (IV SOLUTION) ×2 IMPLANT
YANKAUER SUCT BULB TIP NO VENT (SUCTIONS) ×2 IMPLANT

## 2015-09-10 NOTE — Anesthesia Preprocedure Evaluation (Signed)
Anesthesia Evaluation    Reviewed: Allergy & Precautions, NPO status , Patient's Chart, lab work & pertinent test results  Airway Mallampati: I   Neck ROM: Full    Dental  (+) Teeth Intact   Pulmonary shortness of breath, asthma , sleep apnea , COPD,    breath sounds clear to auscultation       Cardiovascular hypertension, Pt. on medications and Pt. on home beta blockers + CAD and + Peripheral Vascular Disease  + dysrhythmias Atrial Fibrillation + Valvular Problems/Murmurs AS  Rhythm:Regular Rate:Normal  Mod AS by ECHO Study Conclusions  - Left ventricle: The cavity size is small. Wall thickness was increased in a pattern of severe LVH. Systolic function was normal. The estimated ejection fraction was in the range of 60% to 65%. Doppler parameters are consistent with abnormal left ventricular relaxation (grade 1 diastolic dysfunction). The E/e&' ratio is >15, suggesting elevated LV filling pressure. - Aortic valve: Heavily calcified aortic valve -the is at least moderate to severe aortic stenosis. Mean gradient (S): 28 mm Hg. Peak gradient (S): 47 mm Hg. Valve area (VTI): 1.01 cm^2. Valve area (Vmax): 1.05 cm^2. Valve area (Vmean): 0.96 cm^2. - Aorta: Aortic root dimension: 40 mm (ED). - Aortic root: The aortic root is mildly dilated. - Mitral valve: Moderately calcified annulus. Mild regurgitation. Valve area by pressure half-time: 1.62 cm^2. Valve area by continuity equation (using LVOT flow): 1.78 cm^2.    Neuro/Psych negative psych ROS   GI/Hepatic Neg liver ROS, hiatal hernia, PUD, GERD  ,  Endo/Other  Morbid obesity  Renal/GU negative Renal ROS     Musculoskeletal Osteo   Abdominal (+) - obese,   Peds  Hematology   Anesthesia Other Findings   Reproductive/Obstetrics                             Anesthesia Physical  Anesthesia Plan  ASA: III  Anesthesia Plan: MAC   Post-op  Pain Management:    Induction:   Airway Management Planned: Simple Face Mask  Additional Equipment:   Intra-op Plan:   Post-operative Plan:   Informed Consent:   Plan Discussed with: CRNA and Surgeon  Anesthesia Plan Comments:         Anesthesia Quick Evaluation

## 2015-09-10 NOTE — Transfer of Care (Signed)
Immediate Anesthesia Transfer of Care Note  Patient: Tim [name]  Procedure(s) Performed: Procedure(s): LUMBAR SPINAL CORD STIMULATOR INSERTION ([name])  Patient Location: PACU  Anesthesia Type:MAC  Level of Consciousness: awake, alert  and oriented  Airway & Oxygen Therapy: Patient Spontanous Breathing and Patient connected to face mask oxygen  Post-op Assessment: Report given to RN, Post -op Vital signs reviewed and stable and Patient moving all extremities X 4  Post vital signs: Reviewed and stable  Last Vitals:  Filed Vitals:   09/10/15 0756  BP: 133/42  Pulse: 71  Temp: 36.9 C  Resp: 18    Complications: No apparent anesthesia complications

## 2015-09-10 NOTE — Op Note (Signed)
PREOP DX: 1) lumbago related to multilevel spondylosis  2) lumbar radiculopathy  3) peripheral neuropathy  4) chronic pain  POSTOP DX: 1) lumbago related to multilevel spondylosis  2) lumbar radiculopathy  3) diabetic peripheral neuropathy  4) chronic pain PROCEDURES PERFORMED:1) intraop fluoro 2) placement of 2 16 contact [employer] Infinion leads 3) placement of Spectra SCS generator  SURGEON:Gemini Beaumier  ASSISTANT: NONE  ANESTHESIA: MAC  EBL: <20cc  DESCRIPTION OF PROCEDURE: After a discussion of risks, benefits and alternatives, informed consent was obtained. The patient was taken to the OR, turned prone onto a Jackson table, all pressure points padded, SCD's placed, and an adequate plane of anesthesia induced. A timeout was taken to verify the correct patient, position, personnel, availability of appropriate equipment, and administration of perioperative antibiotics.  The thoracic and lumbar areas were widely prepped with chloraprep and draped into a sterile field. Fluoroscopy was used to plan a right paramedian incision at the L1-L3 levels, and an incision made with a 10 blade and carried down to the dorsolumbar fascia with the bovie and blunt dissection. Retractors were placed and a 14g [employer] tuohy needle placed into the epidural space at the T12-L1 interspace using biplanar fluoro and loss-of-resistance technique. The needle was aspirated without any return of fluid. A [employer] INFINION lead was introduced and under live AP fluoro advanced until the distal-most contact overlay the mid portion of the  T8 vertebral body shadow with the rest of the contacts distributed over the T9 and T10 vertebral bodies in a position just right of anatomic midline. A second Infinion lead was placed just left of anatomic midline in the same levels using the  same technique. The patient was awakened and the leads tested; impedances were good, and the patient reported good coverage with amplitudes in the 6-9 mA range. 0 silk sutures were placed in the fascia adjacent to the needles. The needles and stylets were removed under fluoroscopy with no lead migration noted. Leads were then fixed to the fascia by chest tube-type fixation into position with the sutures; repeat images were obtained to verify that there had been no lead migration.  The incision was inspected and hemostasis obtained with the bipolar cautery.  Attention was then turned to creation of a subcutaneous pocket. At the right flank, a 3 cm incision was made with a 10 blade and using the bovie and blunt dissection a pocket of size appropriate to place a SCS generator. The pocket was trialed, and found to be of adequate size. The pocket was inspected for hemostasis, which was found to be excellent. Using reverse seldinger technique, the leads were tunneled to the pocket site, and the leads inserted into the SCS generator. Impedances were checked, and all found to be excellent. The leads were then all fixed into position with a self-torquing wrench. The wiring was all carefully coiled, placed behind the generator and placed in the pocket.  Both incisions were copiously irrigated with bacitracin-containing irrigation. The lumbar incision was closed in 2 deep layers of interrupted 2-0 vicryl and the skin closed with staples. The pocket incision was closed with a deeper layer of 2-0 vicryl interrupted sutures, and the skin closed with staples. Sterile dressings were applied. Needle, sponge, and instrument counts were correct x2 at the end of the case.  The patient was then carefully awakened from anesthesia, turned supine, an abdominal binder placed, and the patient taken to the recovery room where he underwent complex spinal cord stimulator programming.  COMPLICATIONS:  NONE  CONDITION: Stable  throughout the course of the procedure and immediately afterward  DISPOSITION: discharge to home, with antibiotics and pain medicine. Discussed care with the patient and family member. Followup in clinic will be scheduled in 10-14 days.

## 2015-09-10 NOTE — Discharge Instructions (Signed)
Dr. Maryjean Ka Post-Op Orders   Ice Pack - 20 minutes on (in a pillow case), and 20 minutes off. Wear the ice pack UNDER the binder.  Follow up in office, they will call you for an appointment in 10 days to 2 weeks.  Increase activity gradually.    No lifting anything heavier than a gallon of milk (10 pounds) until seen in the office.  Advance diet slowly as tolerated.  Dressing care:  Keep dressing dry for 3 days, and on Post-op day 4, may shower.  Call for fever, drainage, and redness.  No swimming or bathing in a bathtub (do not get into standing water).  RESTART ELIQUIS  09/15/2015

## 2015-09-10 NOTE — Anesthesia Postprocedure Evaluation (Signed)
Anesthesia Post Note  Patient: Tim [name]  Procedure(s) Performed: Procedure(s) (LRB): LUMBAR SPINAL CORD STIMULATOR INSERTION ([name])  Patient location during evaluation: PACU Anesthesia Type: MAC Level of consciousness: awake and alert Pain management: pain level controlled Vital Signs Assessment: post-procedure vital signs reviewed and stable Respiratory status: spontaneous breathing and respiratory function stable Cardiovascular status: stable Anesthetic complications: no    Last Vitals:  Filed Vitals:   09/10/15 1147 09/10/15 1201  BP: 141/60 147/62  Pulse: 67 70  Temp: 36.4 C   Resp: 21 8    Last Pain: There were no vitals filed for this visit.               Elah Avellino DANIEL

## 2015-09-13 ENCOUNTER — Encounter (HOSPITAL_COMMUNITY): Payer: Self-pay | Admitting: Anesthesiology

## 2015-09-16 ENCOUNTER — Encounter: Payer: Medicare Other | Admitting: Registered Nurse

## 2015-09-20 ENCOUNTER — Other Ambulatory Visit: Payer: Self-pay

## 2015-09-20 MED ORDER — ROSUVASTATIN CALCIUM 40 MG PO TABS: 40.0000 mg | ORAL_TABLET | Freq: Every day | ORAL | Status: DC

## 2015-09-23 ENCOUNTER — Encounter: Payer: Self-pay | Admitting: Registered Nurse

## 2015-09-23 ENCOUNTER — Encounter: Payer: Medicare Other | Attending: Registered Nurse | Admitting: Registered Nurse

## 2015-09-23 VITALS — BP 155/69 | HR 73

## 2015-09-23 DIAGNOSIS — G8929 Other chronic pain: Secondary | ICD-10-CM

## 2015-09-23 DIAGNOSIS — K219 Gastro-esophageal reflux disease without esophagitis: Secondary | ICD-10-CM | POA: Diagnosis not present

## 2015-09-23 DIAGNOSIS — M109 Gout, unspecified: Secondary | ICD-10-CM | POA: Insufficient documentation

## 2015-09-23 DIAGNOSIS — G894 Chronic pain syndrome: Secondary | ICD-10-CM | POA: Diagnosis not present

## 2015-09-23 DIAGNOSIS — Z5181 Encounter for therapeutic drug level monitoring: Secondary | ICD-10-CM

## 2015-09-23 DIAGNOSIS — M545 Low back pain, unspecified: Secondary | ICD-10-CM

## 2015-09-23 DIAGNOSIS — E7800 Pure hypercholesterolemia, unspecified: Secondary | ICD-10-CM | POA: Diagnosis not present

## 2015-09-23 DIAGNOSIS — M79672 Pain in left foot: Secondary | ICD-10-CM | POA: Diagnosis present

## 2015-09-23 DIAGNOSIS — I2510 Atherosclerotic heart disease of native coronary artery without angina pectoris: Secondary | ICD-10-CM | POA: Diagnosis not present

## 2015-09-23 DIAGNOSIS — M4806 Spinal stenosis, lumbar region: Secondary | ICD-10-CM

## 2015-09-23 DIAGNOSIS — G609 Hereditary and idiopathic neuropathy, unspecified: Secondary | ICD-10-CM | POA: Diagnosis not present

## 2015-09-23 DIAGNOSIS — J449 Chronic obstructive pulmonary disease, unspecified: Secondary | ICD-10-CM | POA: Insufficient documentation

## 2015-09-23 DIAGNOSIS — M48061 Spinal stenosis, lumbar region without neurogenic claudication: Secondary | ICD-10-CM

## 2015-09-23 DIAGNOSIS — Z79899 Other long term (current) drug therapy: Secondary | ICD-10-CM

## 2015-09-23 DIAGNOSIS — M1990 Unspecified osteoarthritis, unspecified site: Secondary | ICD-10-CM | POA: Insufficient documentation

## 2015-09-23 MED ORDER — OXYCODONE HCL 10 MG PO TABS: 10.0000 mg | ORAL_TABLET | Freq: Four times a day (QID) | ORAL | Status: DC | PRN

## 2015-09-23 NOTE — Progress Notes (Signed)
Subjective:    Patient ID: Tim [name], male    DOB: May 13, 1941, 75 y.o.   MRN: [id number]  HPI: [name] is a 75 year old male who returns for follow up for chronic pain and medication refill. He states his pain is located in his lower back and left foot He rates his pain 2. His current exercise regime is walking daily for 3- 5 minutes daily.   S/P Permanent Spinal Stimulator Placed on 09/10/15 by Dr. Maryjean Ka, he's receiving moderate relief of pain he states.  Pain Inventory Average Pain 5 Pain Right Now 2 My pain is intermittent, sharp, dull, stabbing, tingling and aching  In the last 24 hours, has pain interfered with the following? General activity 4 Relation with others 7 Enjoyment of life 3 What TIME of day is your pain at its worst? night Sleep (in general) Fair  Pain is worse with: walking, bending, standing and some activites Pain improves with: rest, pacing activities, medication, TENS, injections and spinal cord stimulator Relief from Meds: na  Mobility use a cane how many minutes can you walk? 5 ability to climb steps?  yes do you drive?  yes  Function retired I need assistance with the following:  household duties and shopping  Neuro/Psych weakness tremor tingling trouble walking  Prior Studies Any changes since last visit?  no  Physicians involved in your care Any changes since last visit?  yes Neurosurgeon Harkins---placed spinal cord stimulator 2 weeks ago   Family History  Problem Relation Age of Onset  . Hypertension Mother   . Coronary artery disease Mother   . Heart attack Mother   . Pulmonary fibrosis Father     asbestosis  . Neuropathy Mother    Social History   Social History  . Marital Status: Married    Spouse Name: [name]  . Number of Children: 0  . Years of Education: 20   Occupational History  . engineering     retired   Social History Main Topics  . Smoking status: Never Smoker   . Smokeless tobacco:  Never Used  . Alcohol Use: No  . Drug Use: No  . Sexual Activity: Not Currently   Other Topics Concern  . None   Social History Narrative   HSG, [name] - BS, [name] - MS-engineering, 2 years on PhD - [name]. Married - '65 - 23yr/divorced; '76- 3 yrs/divorced; '92 . No children. 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 ACP/Living Will - Yes CPR; long-term Mechanical ventilation as long as he was able to cognate; ok for long term artificial nutrition. Precondition being able to cognate and not to have too much pain.    Past Surgical History  Procedure Laterality Date  . Hemorrhoid surgery    . Vitrectomy    . Intraocular lens insertion Bilateral   . Leg surgery Bilateral     lenghtening   . Knee surgery Right   . Traeculectomy Left     eye  . Shoulder arthroscopy  08/30/2011    Procedure: ARTHROSCOPY SHOULDER;  Surgeon: [name] MD;  [place] [facility]  Service: Orthopedics;  Laterality: Right;  Right Shoulder Arthroscopy, Debridement, and Decompression  . Tonsillectomy    . Colonoscopy    . Cholecystectomy    . 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  R big toe surg due to osteoarth  . Colonoscopy [name] 10/30/2012    Procedure: COLONOSCOPY;  Surgeon:  [name], MD;  Location: [facility];  Service: Endoscopy;  Laterality: [name];  . Cardiac catheterization  1997    Non-obstructive disease  . Cardiac catheterization  12/24/2012    mild < 20% LCx, prox 30% RCA; LVEF 55-65% , moderate pulmonary HTN, moderate AS  . Eye surgery Bilateral cataract  . Amputation Left 04/11/2013    Procedure: AMPUTATION DIGIT Left 3rd toe;  Surgeon: [name], MD;  Location: [facility];  Service: Orthopedics;  Laterality: Left;  Left 3rd toe amputation at [facility]  . Left and right heart catheterization with coronary angiogram [name] 12/24/2012    Procedure: LEFT AND RIGHT HEART CATHETERIZATION WITH CORONARY ANGIOGRAM;  Surgeon: [name], MD;  Location: [facility] CATH  LAB;  Service: Cardiovascular;  Laterality: [name];  . Transthoracic echocardiogram  01/2015    No signif change in aortic stenosis (moderate).  Severe LVH w/small LV cavity, EF 60-65%, grade I diast dysfxn.  [name] Spinal cord stimulator insertion [name] 09/10/2015    Procedure: LUMBAR SPINAL CORD STIMULATOR INSERTION;  Surgeon: [name], MD;  Location: [facility];  Service: Neurosurgery;  Laterality: [name];   Past Medical History  Diagnosis Date  . HYPERCHOLESTEROLEMIA-PURE   . GOUT   . OSA on CPAP     8 cm H2O  . Essential hypertension, benign   . VENOUS INSUFFICIENCY   . ASTHMA   . COPD   . VENTRAL HERNIA   . OSTEOARTHRITIS   . CATARACT, HX OF   . PULMONARY HYPERTENSION, HX OF   . Hallux valgus (acquired)   . Obesity, unspecified   . Osteoarthrosis, unspecified whether generalized or localized, unspecified site   . Unspecified glaucoma   . Moderate aortic stenosis     Per 12/2012 echo- (VTI 1.08 cm^2, Vmax 1.11 cm ^2), mild LA dilatation  . Paroxysmal atrial fibrillation ([place]) 2003    Off anticoag for a while due to falls.  Then apixaban started 12/2014.  . Lumbosacral neuritis   . Unspecified hereditary and idiopathic peripheral neuropathy approx age 80    bilat LE's, ? left arm, too.  Feet became progressively numb + left foot pain intermittently.  Pt may be trying a spinal stimulator (as of 05/2015)  . Lumbosacral spondylosis   . CAD (coronary artery disease)     Nonobstructive by 12/2012 cath  . PUD (peptic ulcer disease)   . BPH (benign prostatic hypertrophy)   . HH (hiatus hernia)   . Glaucoma   . GERD (gastroesophageal reflux disease)   . Personal history of colonic adenoma 10/30/2012  . Shortness of breath     with exertion  . Peripheral neuropathy ([place])     Heredetary; with chronic neuropathic pain (Dr. Ella Bodo)  . Normal memory function 08/2014    Neuropsychological testing ([facility]): no cognitive impairment or sign of neurodegenerative disorder.   Likely has adjustment d/o with mixed anxiety/depressed features and may benefit from low dose SNRI.    . IFG (impaired fasting glucose)     HbA1c jumped from 5.7% to 6.1% 03/2015---started metformin at that time.  . Obesity hypoventilation syndrome (HCC)    BP 155/69 mmHg  Pulse 73  SpO2 96%  Opioid Risk Score:   Fall Risk Score:  `1  Depression screen PHQ 2/9  Depression screen [facility] 2/9 09/23/2015 07/15/2015 03/16/2015 02/16/2015 09/16/2014  Decreased Interest 0 0 0 0 1  Down, Depressed, Hopeless 0 0 0 0 0  PHQ - 2 Score 0 0 0 0 1  Altered sleeping - - - - 2  Tired, decreased energy - - - - 2  Change in appetite - - - - 1  Feeling bad or failure about yourself  - - - - 0  Trouble concentrating - - - - 0  Moving slowly or fidgety/restless - - - - 0  Suicidal thoughts - - - - 0  PHQ-9 Score - - - - 6     Review of Systems  Respiratory: Positive for apnea and shortness of breath.   Cardiovascular: Positive for leg swelling.  Gastrointestinal: Positive for constipation.  Genitourinary: Positive for difficulty urinating.  All other systems reviewed and are negative.      Objective:   Physical Exam  Constitutional: He is oriented to person, place, and time. He appears well-developed and well-nourished.  HENT:  Head: Normocephalic and atraumatic.  Neck: Normal range of motion. Neck supple.  Cardiovascular: Normal rate and regular rhythm.   Pulmonary/Chest: Effort normal and breath sounds normal.  Musculoskeletal:  Normal Muscle Bulk and Muscle Testing Reveals: Upper Extremities: Right: Decreased ROM 90 Degrees and Muscle Strength 4/5 Left: Full ROM and Muscle Strength 5/5 Back without spinal tenderness Lower Extremities: Full ROM and Muscle Strength 5/5 Arises from chair slowly using straight cane for support Narrow Based gait  Neurological: He is alert and oriented to person, place, and time.  Skin: Skin is warm and dry.  Psychiatric: He has a normal mood and affect.    Nursing note and vitals reviewed.         Assessment & Plan:  1. 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  Refilled:Oxycodone 10 mg ---use one every 6 hours as needed for pain.#120. Continue with Gabapentin.  2. DDD lumbar spine with LBP: Neurology Following. Continue with activity and heat therapy.  S/P Permanent  Spinal Stimulator Placed 09/10/15 via Dr. Maryjean Ka.  20 minutes of face to face patient care time was spent during this visit. All questions were encouraged and answered.   F/U in 1 month

## 2015-09-30 ENCOUNTER — Other Ambulatory Visit: Payer: Self-pay | Admitting: Family Medicine

## 2015-09-30 NOTE — Telephone Encounter (Signed)
RF request for metformin LOV: 07/22/15 Next ov: 11/19/15 Last written: 03/22/15 #60 w/ 4RF

## 2015-10-10 ENCOUNTER — Other Ambulatory Visit: Payer: Self-pay | Admitting: Family Medicine

## 2015-10-10 ENCOUNTER — Other Ambulatory Visit: Payer: Self-pay | Admitting: Physical Medicine & Rehabilitation

## 2015-10-11 ENCOUNTER — Other Ambulatory Visit: Payer: Self-pay

## 2015-10-11 MED ORDER — EZETIMIBE 10 MG PO TABS: 10.0000 mg | ORAL_TABLET | Freq: Every day | ORAL | Status: DC

## 2015-10-11 NOTE — Telephone Encounter (Signed)
RF request for potassium LOV: 07/22/15 Next ov: 11/19/15 Last written: 04/06/15 #90 w/ 1RF

## 2015-10-21 ENCOUNTER — Other Ambulatory Visit: Payer: Self-pay

## 2015-10-21 MED ORDER — ROSUVASTATIN CALCIUM 40 MG PO TABS: 40.0000 mg | ORAL_TABLET | Freq: Every day | ORAL | Status: DC

## 2015-10-28 ENCOUNTER — Encounter: Payer: Medicare Other | Attending: Registered Nurse | Admitting: Registered Nurse

## 2015-10-28 ENCOUNTER — Encounter: Payer: Self-pay | Admitting: Registered Nurse

## 2015-10-28 VITALS — BP 135/53 | HR 63 | Resp 14

## 2015-10-28 DIAGNOSIS — E7800 Pure hypercholesterolemia, unspecified: Secondary | ICD-10-CM | POA: Insufficient documentation

## 2015-10-28 DIAGNOSIS — M109 Gout, unspecified: Secondary | ICD-10-CM | POA: Insufficient documentation

## 2015-10-28 DIAGNOSIS — K219 Gastro-esophageal reflux disease without esophagitis: Secondary | ICD-10-CM | POA: Insufficient documentation

## 2015-10-28 DIAGNOSIS — I2510 Atherosclerotic heart disease of native coronary artery without angina pectoris: Secondary | ICD-10-CM | POA: Insufficient documentation

## 2015-10-28 DIAGNOSIS — G609 Hereditary and idiopathic neuropathy, unspecified: Secondary | ICD-10-CM

## 2015-10-28 DIAGNOSIS — Z5181 Encounter for therapeutic drug level monitoring: Secondary | ICD-10-CM | POA: Diagnosis not present

## 2015-10-28 DIAGNOSIS — G894 Chronic pain syndrome: Secondary | ICD-10-CM

## 2015-10-28 DIAGNOSIS — Z79899 Other long term (current) drug therapy: Secondary | ICD-10-CM

## 2015-10-28 DIAGNOSIS — M4806 Spinal stenosis, lumbar region: Secondary | ICD-10-CM

## 2015-10-28 DIAGNOSIS — J449 Chronic obstructive pulmonary disease, unspecified: Secondary | ICD-10-CM | POA: Diagnosis not present

## 2015-10-28 DIAGNOSIS — M1990 Unspecified osteoarthritis, unspecified site: Secondary | ICD-10-CM | POA: Insufficient documentation

## 2015-10-28 DIAGNOSIS — M48061 Spinal stenosis, lumbar region without neurogenic claudication: Secondary | ICD-10-CM

## 2015-10-28 DIAGNOSIS — M79672 Pain in left foot: Secondary | ICD-10-CM | POA: Diagnosis present

## 2015-10-28 MED ORDER — OXYCODONE HCL 10 MG PO TABS: 10.0000 mg | ORAL_TABLET | Freq: Four times a day (QID) | ORAL | Status: DC | PRN

## 2015-10-28 NOTE — Progress Notes (Signed)
Subjective:    Patient ID: Tim [name], male    DOB: [birth year], 75 y.o.   MRN: [id number]  HPI: [name] is a 75 year old male who returns for follow up for chronic pain and medication refill. He states his pain is located in his lower back and left foot. He rates his pain 1. His current exercise regime is walking daily for 5 minutes daily.   S/P Permanent Spinal Stimulator Placed on 09/10/15 by Dr. Maryjean Ka, he's receiving moderate relief of pain he states.  Pain Inventory Average Pain 6 Pain Right Now 1 My pain is intermittent, sharp, dull, stabbing, tingling and aching  In the last 24 hours, has pain interfered with the following? General activity 4 Relation with others 4 Enjoyment of life 3 What TIME of day is your pain at its worst? evening, night  Sleep (in general) Fair  Pain is worse with: walking, bending, standing and some activites Pain improves with: rest, pacing activities, medication, TENS and injections Relief from Meds: 8  Mobility walk with assistance use a cane how many minutes can you walk? 5 ability to climb steps?  yes do you drive?  yes  Function retired I need assistance with the following:  household duties and shopping  Neuro/Psych weakness numbness tremor tingling trouble walking  Prior Studies Any changes since last visit?  no  Physicians involved in your care Any changes since last visit?  no   Family History  Problem Relation Age of Onset  . Hypertension Mother   . Coronary artery disease Mother   . Heart attack Mother   . Pulmonary fibrosis Father     asbestosis  . Neuropathy Mother    Social History   Social History  . Marital Status: Married    Spouse Name: [name]  . Number of Children: 0  . Years of Education: 20   Occupational History  . engineering     retired   Social History Main Topics  . Smoking status: Never Smoker   . Smokeless tobacco: Never Used  . Alcohol Use: No  . Drug Use: No  .  Sexual Activity: Not Currently   Other Topics Concern  . None   Social History Narrative   HSG, [name] - BS, [name] - MS-engineering, 2 years on PhD - [name]. Married - '65 - 82yr/divorced; '76- 3 yrs/divorced; '92 . No children. 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 ACP/Living Will - Yes CPR; long-term Mechanical ventilation as long as he was able to cognate; ok for long term artificial nutrition. Precondition being able to cognate and not to have too much pain.    Past Surgical History  Procedure Laterality Date  . Hemorrhoid surgery    . Vitrectomy    . Intraocular lens insertion Bilateral   . Leg surgery Bilateral     lenghtening   . Knee surgery Right   . Traeculectomy Left     eye  . Shoulder arthroscopy  08/30/2011    Procedure: ARTHROSCOPY SHOULDER;  Surgeon: [name] MD;  [place] [facility]  Service: Orthopedics;  Laterality: Right;  Right Shoulder Arthroscopy, Debridement, and Decompression  . Tonsillectomy    . Colonoscopy    . Cholecystectomy    . 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  R big toe surg due to osteoarth  . Colonoscopy [name] 10/30/2012    Procedure: COLONOSCOPY;  Surgeon: CGatha Mayer MD;  Location:  [facility];  Service: Endoscopy;  Laterality: [name];  . Cardiac catheterization  1997    Non-obstructive disease  . Cardiac catheterization  12/24/2012    mild < 20% LCx, prox 30% RCA; LVEF 55-65% , moderate pulmonary HTN, moderate AS  . Eye surgery Bilateral cataract  . Amputation Left 04/11/2013    Procedure: AMPUTATION DIGIT Left 3rd toe;  Surgeon: [name], MD;  Location: [facility];  Service: Orthopedics;  Laterality: Left;  Left 3rd toe amputation at [facility]  . Left and right heart catheterization with coronary angiogram [name] 12/24/2012    Procedure: LEFT AND RIGHT HEART CATHETERIZATION WITH CORONARY ANGIOGRAM;  Surgeon: [name], MD;  [facility] C[facility] LAB;  Service: Cardiovascular;  Laterality: [name];  .  Transthoracic echocardiogram  01/2015    No signif change in aortic stenosis (moderate).  Severe LVH w/small LV cavity, EF 60-65%, grade I diast dysfxn.  [name] Spinal cord stimulator insertion [name] 09/10/2015    Procedure: LUMBAR SPINAL CORD STIMULATOR INSERTION;  Surgeon: [name], MD;  Location: [facility];  Service: Neurosurgery;  Laterality: [name];   Past Medical History  Diagnosis Date  . HYPERCHOLESTEROLEMIA-PURE   . GOUT   . OSA on CPAP     8 cm H2O  . Essential hypertension, benign   . VENOUS INSUFFICIENCY   . ASTHMA   . COPD   . VENTRAL HERNIA   . OSTEOARTHRITIS   . CATARACT, HX OF   . PULMONARY HYPERTENSION, HX OF   . Hallux valgus (acquired)   . Obesity, unspecified   . Osteoarthrosis, unspecified whether generalized or localized, unspecified site   . Unspecified glaucoma   . Moderate aortic stenosis     Per 12/2012 echo- (VTI 1.08 cm^2, Vmax 1.11 cm ^2), mild LA dilatation  . Paroxysmal atrial fibrillation ([place]) 2003    Off anticoag for a while due to falls.  Then apixaban started 12/2014.  . Lumbosacral neuritis   . Unspecified hereditary and idiopathic peripheral neuropathy approx age 31    bilat LE's, ? left arm, too.  Feet became progressively numb + left foot pain intermittently.  Pt may be trying a spinal stimulator (as of 05/2015)  . Lumbosacral spondylosis   . CAD (coronary artery disease)     Nonobstructive by 12/2012 cath  . PUD (peptic ulcer disease)   . BPH (benign prostatic hypertrophy)   . HH (hiatus hernia)   . Glaucoma   . GERD (gastroesophageal reflux disease)   . Personal history of colonic adenoma 10/30/2012  . Shortness of breath     with exertion  . Peripheral neuropathy ([place])     Heredetary; with chronic neuropathic pain (Dr. Ella Bodo)  . Normal memory function 08/2014    Neuropsychological testing ([facility]): no cognitive impairment or sign of neurodegenerative disorder.  Likely has adjustment d/o with mixed anxiety/depressed  features and may benefit from low dose SNRI.    . IFG (impaired fasting glucose)     HbA1c jumped from 5.7% to 6.1% 03/2015---started metformin at that time.  . Obesity hypoventilation syndrome (HCC)    BP 175/72 mmHg  Pulse 68  Resp 14  SpO2 98%  Opioid Risk Score:   Fall Risk Score:  `1  Depression screen PHQ 2/9  Depression screen [name] 2/9 09/23/2015 07/15/2015 03/16/2015 02/16/2015 09/16/2014  Decreased Interest 0 0 0 0 1  Down, Depressed, Hopeless 0 0 0 0 0  PHQ - 2 Score 0 0 0 0 1  Altered sleeping - - - -  2  Tired, decreased energy - - - - 2  Change in appetite - - - - 1  Feeling bad or failure about yourself  - - - - 0  Trouble concentrating - - - - 0  Moving slowly or fidgety/restless - - - - 0  Suicidal thoughts - - - - 0  PHQ-9 Score - - - - 6     Review of Systems  Respiratory: Positive for apnea and shortness of breath.   Cardiovascular: Positive for leg swelling.  All other systems reviewed and are negative.      Objective:   Physical Exam  Constitutional: He is oriented to person, place, and time. He appears well-developed and well-nourished.  HENT:  Head: Normocephalic and atraumatic.  Neck: Normal range of motion. Neck supple.  Cardiovascular: Normal rate and regular rhythm.   Pulmonary/Chest: Effort normal and breath sounds normal.  Musculoskeletal:  Normal Muscle Bulk and Muscle Testing Reveals:  Upper Extremities: Right: Decreased ROM: 45 Degrees and Muscle Strength 5/5 Left: Full ROM and Muscle Strength 5/5 Back without spinal tenderness Lower Extremities: Full ROM and Muscle Strength 5/5 Arises from chair with ease using straight cane for support Narrow Based gait    Neurological: He is alert and oriented to person, place, and time.  Skin: Skin is warm and dry.  Psychiatric: He has a normal mood and affect.  Nursing note and vitals reviewed.         Assessment & Plan:  1. 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  Refilled:Oxycodone 10 mg ---use one every 6 hours as needed for pain.#120. Continue with Gabapentin We will continue the opioid monitoring program, this consists of regular clinic visits, examinations, urine drug screen, pill counts as well as use of [place] Controlled Substance Reporting System.  Mr. Hammitt spinal stimulator being adjusted, he's not ready to decrease his oxycodone at this time.  We will re-evaluate next month. 2. DDD lumbar spine with LBP: Neurology Following. Continue with activity and heat therapy.  S/P Permanent Spinal Stimulator Placed 09/10/15 via Dr. Maryjean Ka.  20 minutes of face to face patient care time was spent during this visit. All questions were encouraged and answered.   F/U in 1 month

## 2015-11-03 LAB — TOXASSURE SELECT,+ANTIDEPR,UR

## 2015-11-04 ENCOUNTER — Other Ambulatory Visit: Payer: Self-pay | Admitting: Family Medicine

## 2015-11-04 NOTE — Telephone Encounter (Signed)
Will authorize RF of lotrisone cream.

## 2015-11-04 NOTE — Telephone Encounter (Signed)
RF request for lotrisone LOV: 07/22/15 Next ov: 11/19/15 Last written: 03/19/14 #45g w/ 6Rf  Please advise. Thanks.

## 2015-11-09 NOTE — Progress Notes (Signed)
Urine drug screen for this encounter is consistent for prescribed medication 

## 2015-11-15 ENCOUNTER — Other Ambulatory Visit: Payer: Self-pay | Admitting: *Deleted

## 2015-11-15 MED ORDER — EZETIMIBE 10 MG PO TABS: 10.0000 mg | ORAL_TABLET | Freq: Every day | ORAL | Status: DC

## 2015-11-17 ENCOUNTER — Telehealth: Payer: Self-pay | Admitting: Cardiology

## 2015-11-17 MED ORDER — ROSUVASTATIN CALCIUM 40 MG PO TABS: 40.0000 mg | ORAL_TABLET | Freq: Every day | ORAL | Status: DC

## 2015-11-17 MED ORDER — EZETIMIBE 10 MG PO TABS: 10.0000 mg | ORAL_TABLET | Freq: Every day | ORAL | Status: DC

## 2015-11-17 NOTE — Telephone Encounter (Signed)
Refill sent to the pharmacy electronically.  

## 2015-11-17 NOTE — Telephone Encounter (Signed)
New Message   *STAT* If patient is at the pharmacy, call can be transferred to refill team.   1. Which medications need to be refilled? (please list name of each medication and dose if known)   ezetimibe (ZETIA) 10 MG tablet Crestor   2. Which pharmacy/location (including street and city if local pharmacy) is medication to be sent to? [facility] [address]   3. Do they need a 30 day or 90 day supply? Not sure  Appt made for 01/05/2016 please refill

## 2015-11-19 ENCOUNTER — Encounter: Payer: Medicare Other | Admitting: Family Medicine

## 2015-11-23 ENCOUNTER — Encounter: Payer: Self-pay | Admitting: Registered Nurse

## 2015-11-23 ENCOUNTER — Ambulatory Visit (INDEPENDENT_AMBULATORY_CARE_PROVIDER_SITE_OTHER): Payer: Medicare Other | Admitting: Family Medicine

## 2015-11-23 ENCOUNTER — Encounter: Payer: Self-pay | Admitting: Family Medicine

## 2015-11-23 ENCOUNTER — Encounter: Payer: Medicare Other | Attending: Registered Nurse | Admitting: Registered Nurse

## 2015-11-23 VITALS — BP 154/67 | HR 77 | Resp 14

## 2015-11-23 VITALS — BP 129/66 | HR 76 | Temp 97.9°F | Resp 16 | Ht 72.5 in | Wt 351.5 lb

## 2015-11-23 DIAGNOSIS — G894 Chronic pain syndrome: Secondary | ICD-10-CM

## 2015-11-23 DIAGNOSIS — Z0000 Encounter for general adult medical examination without abnormal findings: Secondary | ICD-10-CM

## 2015-11-23 DIAGNOSIS — R7301 Impaired fasting glucose: Secondary | ICD-10-CM | POA: Diagnosis not present

## 2015-11-23 DIAGNOSIS — J449 Chronic obstructive pulmonary disease, unspecified: Secondary | ICD-10-CM | POA: Insufficient documentation

## 2015-11-23 DIAGNOSIS — I2510 Atherosclerotic heart disease of native coronary artery without angina pectoris: Secondary | ICD-10-CM | POA: Insufficient documentation

## 2015-11-23 DIAGNOSIS — M79672 Pain in left foot: Secondary | ICD-10-CM | POA: Diagnosis present

## 2015-11-23 DIAGNOSIS — E7800 Pure hypercholesterolemia, unspecified: Secondary | ICD-10-CM | POA: Diagnosis not present

## 2015-11-23 DIAGNOSIS — M4806 Spinal stenosis, lumbar region: Secondary | ICD-10-CM

## 2015-11-23 DIAGNOSIS — M1990 Unspecified osteoarthritis, unspecified site: Secondary | ICD-10-CM | POA: Insufficient documentation

## 2015-11-23 DIAGNOSIS — K219 Gastro-esophageal reflux disease without esophagitis: Secondary | ICD-10-CM | POA: Diagnosis not present

## 2015-11-23 DIAGNOSIS — M545 Low back pain, unspecified: Secondary | ICD-10-CM

## 2015-11-23 DIAGNOSIS — H9193 Unspecified hearing loss, bilateral: Secondary | ICD-10-CM | POA: Diagnosis not present

## 2015-11-23 DIAGNOSIS — G609 Hereditary and idiopathic neuropathy, unspecified: Secondary | ICD-10-CM | POA: Diagnosis not present

## 2015-11-23 DIAGNOSIS — Z79899 Other long term (current) drug therapy: Secondary | ICD-10-CM

## 2015-11-23 DIAGNOSIS — Z125 Encounter for screening for malignant neoplasm of prostate: Secondary | ICD-10-CM

## 2015-11-23 DIAGNOSIS — M109 Gout, unspecified: Secondary | ICD-10-CM | POA: Insufficient documentation

## 2015-11-23 DIAGNOSIS — M48061 Spinal stenosis, lumbar region without neurogenic claudication: Secondary | ICD-10-CM

## 2015-11-23 DIAGNOSIS — Z5181 Encounter for therapeutic drug level monitoring: Secondary | ICD-10-CM

## 2015-11-23 DIAGNOSIS — G8929 Other chronic pain: Secondary | ICD-10-CM

## 2015-11-23 LAB — LIPID PANEL
Cholesterol: 120 mg/dL (ref 0–200)
HDL: 49.1 mg/dL (ref >39.00)
LDL Cholesterol: 51 mg/dL (ref 0–99)
NONHDL: 70.69
Total CHOL/HDL Ratio: 2
Triglycerides: 100 mg/dL (ref 0.0–149.0)
VLDL: 20 mg/dL (ref 0.0–40.0)

## 2015-11-23 LAB — COMPREHENSIVE METABOLIC PANEL
ALK PHOS: 55 U/L (ref 39–117)
ALT: 18 U/L (ref 0–53)
AST: 18 U/L (ref 0–37)
Albumin: 4.3 g/dL (ref 3.5–5.2)
BILIRUBIN TOTAL: 0.6 mg/dL (ref 0.2–1.2)
BUN: 18 mg/dL (ref 6–23)
CO2: 27 mEq/L (ref 19–32)
CREATININE: 0.66 mg/dL (ref 0.40–1.50)
Calcium: 9.6 mg/dL (ref 8.4–10.5)
Chloride: 105 mEq/L (ref 96–112)
GFR: 124.73 mL/min (ref >60.00)
GLUCOSE: 117 mg/dL — AB (ref 70–99)
Potassium: 4.3 mEq/L (ref 3.5–5.1)
SODIUM: 140 meq/L (ref 135–145)
TOTAL PROTEIN: 6.6 g/dL (ref 6.0–8.3)

## 2015-11-23 LAB — CBC WITH DIFFERENTIAL/PLATELET
BASOS ABS: 0 10*3/uL (ref 0.0–0.1)
BASOS PCT: 0.4 % (ref 0.0–3.0)
Eosinophils Absolute: 0.1 10*3/uL (ref 0.0–0.7)
Eosinophils Relative: 1.2 % (ref 0.0–5.0)
HCT: 39.6 % (ref 39.0–52.0)
Hemoglobin: 13.1 g/dL (ref 13.0–17.0)
LYMPHS ABS: 2.3 10*3/uL (ref 0.7–4.0)
Lymphocytes Relative: 30.8 % (ref 12.0–46.0)
MCHC: 33 g/dL (ref 30.0–36.0)
MCV: 89.7 fl (ref 78.0–100.0)
MONOS PCT: 8.5 % (ref 3.0–12.0)
Monocytes Absolute: 0.6 10*3/uL (ref 0.1–1.0)
NEUTROS ABS: 4.4 10*3/uL (ref 1.4–7.7)
NEUTROS PCT: 59.1 % (ref 43.0–77.0)
PLATELETS: 168 10*3/uL (ref 150.0–400.0)
RBC: 4.42 Mil/uL (ref 4.22–5.81)
RDW: 15.3 % (ref 11.5–15.5)
WBC: 7.4 10*3/uL (ref 4.0–10.5)

## 2015-11-23 LAB — PSA, MEDICARE: PSA: 0.23 ng/ml (ref 0.10–4.00)

## 2015-11-23 MED ORDER — OXYCODONE HCL 10 MG PO TABS: 10.0000 mg | ORAL_TABLET | Freq: Four times a day (QID) | ORAL | Status: DC | PRN

## 2015-11-23 NOTE — Progress Notes (Signed)
Pre visit review using our clinic review tool, if applicable. No additional management support is needed unless otherwise documented below in the visit note. 

## 2015-11-23 NOTE — Progress Notes (Signed)
Subjective:    Patient ID: Tim [name], male    DOB: December 13, 1940, 75 y.o.   MRN: [id number]  HPI: [name] is a 75 year old male who returns for follow up for chronic pain and medication refill. He states his pain is located in his lower back and left foot. He rates his pain 3. Also states the spinal stimulator has helped his pain with some relief, he still requiring to take the oxycodone QID at this time. We will continue current medication this month and re-assessed next month with Dr. Letta Pate. He verbalizes understanding. His current exercise regime is walking daily for 5 minutes daily.   S/P Permanent Spinal Stimulator Placed on 09/10/15 by Dr. Maryjean Ka, he's receiving moderate relief of pain he states.  Pain Inventory Average Pain 1 Pain Right Now 3 My pain is intermittent, sharp, dull, stabbing, tingling and aching  In the last 24 hours, has pain interfered with the following? General activity 8 Relation with others 8 Enjoyment of life 4 What TIME of day is your pain at its worst? night Sleep (in general) Poor  Pain is worse with: walking, bending, standing and some activites Pain improves with: rest, pacing activities, medication and TENS Relief from Meds: 7  Mobility use a cane how many minutes can you walk? 5 ability to climb steps?  yes do you drive?  yes  Function retired I need assistance with the following:  meal prep, household duties and shopping  Neuro/Psych weakness numbness tremor tingling trouble walking dizziness  Prior Studies Any changes since last visit?  no  Physicians involved in your care Any changes since last visit?  no   Family History  Problem Relation Age of Onset  . Hypertension Mother   . Coronary artery disease Mother   . Heart attack Mother   . Pulmonary fibrosis Father     asbestosis  . Neuropathy Mother    Social History   Social History  . Marital Status: Married    Spouse Name: [name]  . Number of  Children: 0  . Years of Education: 20   Occupational History  . engineering     retired   Social History Main Topics  . Smoking status: Never Smoker   . Smokeless tobacco: Never Used  . Alcohol Use: No  . Drug Use: No  . Sexual Activity: Not Currently   Other Topics Concern  . None   Social History Narrative   HSG, [name] - BS, [name] - MS-engineering, 2 years on PhD - [name]. Married - '65 - 61yr/divorced; '76- 3 yrs/divorced; '92 . No children. 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 ACP/Living Will - Yes CPR; long-term Mechanical ventilation as long as he was able to cognate; ok for long term artificial nutrition. Precondition being able to cognate and not to have too much pain.    Past Surgical History  Procedure Laterality Date  . Hemorrhoid surgery    . Vitrectomy    . Intraocular lens insertion Bilateral   . Leg surgery Bilateral     lenghtening   . Knee surgery Right   . Traeculectomy Left     eye  . Shoulder arthroscopy  08/30/2011    Procedure: ARTHROSCOPY SHOULDER;  Surgeon: [name] MD;  [facility]  Service: Orthopedics;  Laterality: Right;  Right Shoulder Arthroscopy, Debridement, and Decompression  . Tonsillectomy    . Colonoscopy    . Cholecystectomy    .  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  R big toe surg due to osteoarth  . Colonoscopy [name] 10/30/2012    Procedure: COLONOSCOPY;  Surgeon: [name], MD;  [facility]: [facility];  Service: Endoscopy;  Laterality: [name];  . Cardiac catheterization  1997    Non-obstructive disease  . Cardiac catheterization  12/24/2012    mild < 20% LCx, prox 30% RCA; LVEF 55-65% , moderate pulmonary HTN, moderate AS  . Eye surgery Bilateral cataract  . Amputation Left 04/11/2013    Procedure: AMPUTATION DIGIT Left 3rd toe;  Surgeon: [name], MD;  Location: [facility];  Service: Orthopedics;  Laterality: Left;  Left 3rd toe amputation at [facility]  . Left and right heart  catheterization with coronary angiogram [name] 12/24/2012    Procedure: LEFT AND RIGHT HEART CATHETERIZATION WITH CORONARY ANGIOGRAM;  Surgeon: [name], MD;  Location: [name] CATH LAB;  Service: Cardiovascular;  Laterality: [name];  . Transthoracic echocardiogram  01/2015    No signif change in aortic stenosis (moderate).  Severe LVH w/small LV cavity, EF 60-65%, grade I diast dysfxn.  [name] Spinal cord stimulator insertion [name] 09/10/2015    Procedure: LUMBAR SPINAL CORD STIMULATOR INSERTION;  Surgeon: [name], MD;  Location: [place] NEURO ORS;  Service: Neurosurgery;  Laterality: [name];   Past Medical History  Diagnosis Date  . HYPERCHOLESTEROLEMIA-PURE   . GOUT   . OSA on CPAP     8 cm H2O  . Essential hypertension, benign   . VENOUS INSUFFICIENCY   . ASTHMA   . COPD   . VENTRAL HERNIA   . OSTEOARTHRITIS   . CATARACT, HX OF   . PULMONARY HYPERTENSION, HX OF   . Hallux valgus (acquired)   . Obesity, unspecified   . Osteoarthrosis, unspecified whether generalized or localized, unspecified site   . Unspecified glaucoma   . Moderate aortic stenosis     Per 12/2012 echo- (VTI 1.08 cm^2, Vmax 1.11 cm ^2), mild LA dilatation  . Paroxysmal atrial fibrillation ([place]) 2003    Off anticoag for a while due to falls.  Then apixaban started 12/2014.  . Lumbosacral neuritis   . Unspecified hereditary and idiopathic peripheral neuropathy approx age 58    bilat LE's, ? left arm, too.  Feet became progressively numb + left foot pain intermittently.  Pt may be trying a spinal stimulator (as of 05/2015)  . Lumbosacral spondylosis   . CAD (coronary artery disease)     Nonobstructive by 12/2012 cath  . PUD (peptic ulcer disease)   . BPH (benign prostatic hypertrophy)   . HH (hiatus hernia)   . Glaucoma   . GERD (gastroesophageal reflux disease)   . Personal history of colonic adenoma 10/30/2012    Diminutive adenoma, consider repeat 2019 per GI  . Shortness of breath     with exertion  . Peripheral  neuropathy ([place])     Heredetary; with chronic neuropathic pain (Dr. Ella Bodo)  . Normal memory function 08/2014    Neuropsychological testing ([facility]): no cognitive impairment or sign of neurodegenerative disorder.  Likely has adjustment d/o with mixed anxiety/depressed features and may benefit from low dose SNRI.    . IFG (impaired fasting glucose)     HbA1c jumped from 5.7% to 6.1% 03/2015---started metformin at that time.  . Obesity hypoventilation syndrome (HCC)    BP 154/67 mmHg  Pulse 77  Resp 14  SpO2 98%  Opioid Risk Score:   Fall Risk Score:  `1  Depression screen PHQ 2/9  Depression screen [facility] 2/9 09/23/2015 07/15/2015 03/16/2015 02/16/2015 09/16/2014  Decreased Interest 0 0 0 0 1  Down, Depressed, Hopeless 0 0 0 0 0  PHQ - 2 Score 0 0 0 0 1  Altered sleeping - - - - 2  Tired, decreased energy - - - - 2  Change in appetite - - - - 1  Feeling bad or failure about yourself  - - - - 0  Trouble concentrating - - - - 0  Moving slowly or fidgety/restless - - - - 0  Suicidal thoughts - - - - 0  PHQ-9 Score - - - - 6     Review of Systems  Musculoskeletal: Positive for gait problem.  Neurological: Positive for dizziness, tremors, weakness and numbness.       Tingling   All other systems reviewed and are negative.      Objective:   Physical Exam  Constitutional: He is oriented to person, place, and time. He appears well-developed and well-nourished.  HENT:  Head: Normocephalic and atraumatic.  Neck: Normal range of motion. Neck supple.  Cardiovascular: Normal rate and regular rhythm.   Pulmonary/Chest: Effort normal and breath sounds normal.  Musculoskeletal:  Normal Muscle Bulk and Muscle Testing Reveals: Upper Extremities: Right: Decreased ROM 45 Degrees and Muscle Strength 5/5 Left: Full ROM and Muscle Strength 5/5 Thoracic paraspinal Tenderness: Right: T-1- T-3 Back without spinal tenderness Lower Extremities: Full ROM and Muscle Strength  5/5 Arises from chair slowly using straight cane for support Narrow Based Gait   Neurological: He is alert and oriented to person, place, and time.  Skin: Skin is warm and dry.  Psychiatric: He has a normal mood and affect.  Nursing note and vitals reviewed.         Assessment & Plan:  1. 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  Refilled:Oxycodone 10 mg ---use one every 6 hours as needed for pain.#120. Continue with Gabapentin We will continue the opioid monitoring program, this consists of regular clinic visits, examinations, urine drug screen, pill counts as well as use of [place] Controlled Substance Reporting System. 2. DDD lumbar spine with LBP: Neurology Following. Continue with activity and heat therapy.  S/P Permanent Spinal Stimulator Placed 09/10/15 via Dr. Maryjean Ka.  20 minutes of face to face patient care time was spent during this visit. All questions were encouraged and answered.   F/U in 1 month

## 2015-11-23 NOTE — Progress Notes (Signed)
Office Note 11/23/2015  CC:  Chief Complaint  Patient presents with  . Annual Exam    Pt is fasting.    HPI:  Tim [name] is a 75 y.o. White male who is here for annual health maintenance exam. Spinal cord stimulator in now, is getting some things fine tuned and keeps following up with his pain md: results have been mixed.  He says he has to turn the stimulator up so far that it somewhat weakens his legs.  We discussed prostate cancer screening and decided this will be his final screening. I reviewed his home bp log today and avg syst is 115-120, avg diast about 60.  Has been having more probs with hearing the last year, understanding discussions on television. 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 He is a Therapist, nutritional and wants good hearing aids but is looking at 7-8 thousand dollars. He asks for referral to another audiology office for a second opinion.   Past Medical History  Diagnosis Date  . HYPERCHOLESTEROLEMIA-PURE   . GOUT   . OSA on CPAP     8 cm H2O  . Essential hypertension, benign   . VENOUS INSUFFICIENCY   . ASTHMA   . COPD   . VENTRAL HERNIA   . OSTEOARTHRITIS   . CATARACT, HX OF   . PULMONARY HYPERTENSION, HX OF   . Hallux valgus (acquired)   . Obesity, unspecified   . Osteoarthrosis, unspecified whether generalized or localized, unspecified site   . Unspecified glaucoma   . Moderate aortic stenosis     Per 12/2012 echo- (VTI 1.08 cm^2, Vmax 1.11 cm ^2), mild LA dilatation  . Paroxysmal atrial fibrillation ([place]) 2003    Off anticoag for a while due to falls.  Then apixaban started 12/2014.  . Lumbosacral neuritis   . Unspecified hereditary and idiopathic peripheral neuropathy approx age 28    bilat LE's, ? left arm, too.  Feet became progressively numb + left foot pain intermittently.  Pt may be trying a spinal stimulator (as of 05/2015)  . Lumbosacral spondylosis   . CAD (coronary artery disease)      Nonobstructive by 12/2012 cath  . PUD (peptic ulcer disease)   . BPH (benign prostatic hypertrophy)   . HH (hiatus hernia)   . Glaucoma   . GERD (gastroesophageal reflux disease)   . Personal history of colonic adenoma 10/30/2012    Diminutive adenoma, consider repeat 2019 per GI  . Shortness of breath     with exertion  . Peripheral neuropathy ([place])     Heredetary; with chronic neuropathic pain (Dr. Ella Bodo)  . Normal memory function 08/2014    Neuropsychological testing ([facility]): no cognitive impairment or sign of neurodegenerative disorder.  Likely has adjustment d/o with mixed anxiety/depressed features and may benefit from low dose SNRI.    . IFG (impaired fasting glucose)     HbA1c jumped from 5.7% to 6.1% 03/2015---started metformin at that time.  . Obesity hypoventilation syndrome [facility])     Past Surgical History  Procedure Laterality Date  . Hemorrhoid surgery    . Vitrectomy    . Intraocular lens insertion Bilateral   . Leg surgery Bilateral     lenghtening   . Knee surgery Right   . Traeculectomy Left     eye  . Shoulder arthroscopy  08/30/2011    Procedure: ARTHROSCOPY SHOULDER;  Surgeon: [name], MD;  Location: [place];  Service: Orthopedics;  Laterality: Right;  Right Shoulder Arthroscopy, Debridement, and Decompression  . Tonsillectomy    . Colonoscopy    . Cholecystectomy    . 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  R big toe surg due to osteoarth  . Colonoscopy [name] 10/30/2012    Procedure: COLONOSCOPY;  Surgeon: [name], MD;  [facility];  Service: Endoscopy;  Laterality: [name];  . Cardiac catheterization  1997    Non-obstructive disease  . Cardiac catheterization  12/24/2012    mild < 20% LCx, prox 30% RCA; LVEF 55-65% , moderate pulmonary HTN, moderate AS  . Eye surgery Bilateral cataract  . Amputation Left 04/11/2013    Procedure: AMPUTATION DIGIT Left 3rd toe;  Surgeon: [name], MD;  Location: [place];   Service: Orthopedics;  Laterality: Left;  Left 3rd toe amputation at [facility]  . Left and right heart catheterization with coronary angiogram [name] 12/24/2012    Procedure: LEFT AND RIGHT HEART CATHETERIZATION WITH CORONARY ANGIOGRAM;  Surgeon: [name], MD;  Location: [facility] CATH LAB;  Service: Cardiovascular;  Laterality: [name];  . Transthoracic echocardiogram  01/2015    No signif change in aortic stenosis (moderate).  Severe LVH w/small LV cavity, EF 60-65%, grade I diast dysfxn.  [name] Spinal cord stimulator insertion [name] 09/10/2015    Procedure: LUMBAR SPINAL CORD STIMULATOR INSERTION;  Surgeon: [name], MD;  Location: [place] NEUR[facility];  Service: Neurosurgery;  Laterality: [name];    Family History  Problem Relation Age of Onset  . Hypertension Mother   . Coronary artery disease Mother   . Heart attack Mother   . Pulmonary fibrosis Father     asbestosis  . Neuropathy Mother     Social History   Social History  . Marital Status: Married    Spouse Name: [name]  . Number of Children: 0  . Years of Education: 20   Occupational History  . engineering     retired   Social History Main Topics  . Smoking status: Never Smoker   . Smokeless tobacco: Never Used  . Alcohol Use: No  . Drug Use: No  . Sexual Activity: Not Currently   Other Topics Concern  . Not on file   Social History Narrative   HSG, [name] - BS, [name] - MS-engineering, 2 years on PhD - [employer]. Married - '65 - 88yr/divorced; '76- 3 yrs/divorced; '92 . No children. 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 ACP/Living Will - Yes CPR; long-term Mechanical ventilation as long as he was able to cognate; ok for long term artificial nutrition. Precondition being able to cognate and not to have too much pain.     Outpatient Prescriptions Prior to Visit  Medication Sig Dispense Refill  . albuterol (PROVENTIL HFA;VENTOLIN HFA) 108 (90 BASE) MCG/ACT inhaler Inhale 2 puffs into the lungs 4 (four) times  daily as needed for wheezing or shortness of breath.    . allopurinol (ZYLOPRIM) 300 MG tablet take 1 tablet by mouth once daily with food 30 tablet 11  . apixaban (ELIQUIS) 5 MG TABS tablet Take 1 tablet (5 mg total) by mouth 2 (two) times daily. 180 tablet 1  . B Complex-C (B-COMPLEX WITH VITAMIN C) tablet Take 1 tablet by mouth daily.    . clotrimazole-betamethasone (LOTRISONE) cream apply to affected area twice a day 45 g 5  . ezetimibe (ZETIA) 10 MG tablet Take 1 tablet (10 mg total) by mouth  daily. 90 tablet 0  . finasteride (PROSCAR) 5 MG tablet take 1 tablet by mouth once daily 90 tablet 1  . fluticasone (FLONASE) 50 MCG/ACT nasal spray Place 2 sprays into both nostrils as needed for allergies or rhinitis.    . Fluticasone-Salmeterol (ADVAIR) 250-50 MCG/DOSE AEPB Inhale 1 puff into the lungs daily as needed (for nasal congestion).     . gabapentin (NEURONTIN) 600 MG tablet take 1 tablet by mouth four times a day 120 tablet 3  . metFORMIN (GLUCOPHAGE) 500 MG tablet take 1 tablet by mouth twice a day with food 60 tablet 6  . metoprolol succinate (TOPROL XL) 25 MG 24 hr tablet Take 1 tablet (25 mg total) by mouth daily. (Patient taking differently: Take 25 mg by mouth daily. To be taken with 50 mg for total of 75 mg daily) 90 tablet 3  . metoprolol succinate (TOPROL-XL) 50 MG 24 hr tablet Take 1 tablet by mouth daily. To be taken with 25 mg for total of 75 mg daily  0  . Multiple Vitamin (MULTIVITAMIN) capsule Take 1 capsule by mouth daily.    . Niacin CR 1000 MG TBCR Take 1 tablet (1,000 mg total) by mouth 2 (two) times daily. 180 each 3  . omeprazole (PRILOSEC) 10 MG capsule take 1 capsule by mouth once daily 30 capsule 11  . Oxycodone HCl 10 MG TABS Take 1 tablet (10 mg total) by mouth every 6 (six) hours as needed. 120 tablet 0  . potassium chloride SA (K-DUR,KLOR-CON) 20 MEQ tablet take 1 tablet by mouth once daily 90 tablet 1  . rosuvastatin (CRESTOR) 40 MG tablet Take 1 tablet (40 mg  total) by mouth daily. 90 tablet 0  . traMADol-acetaminophen (ULTRACET) 37.5-325 MG tablet take 1 tablet by mouth every 6 hours if needed for pain 120 tablet 3  . VITAMIN D, CHOLECALCIFEROL, PO Take 1 tablet by mouth daily.      No facility-administered medications prior to visit.    Allergies  Allergen Reactions  . Brimonidine Tartrate Shortness Of Breath    Alphagan-Shortness of breath  . Brinzolamide Shortness Of Breath    AZOPT- Shortness of breath  . Latanoprost Shortness Of Breath    XALATAN- Shortness of breath  . Timolol Maleate Shortness Of Breath and Other (See Comments)    TIMOPTIC- Aggravated asthma  . Diltiazem Swelling     leg swelling  . Rofecoxib Swelling     VIOXX- leg swelling  . Vancomycin Hives and Other (See Comments)    Possible "Red Man Syndrome"? > hives/blisters  . Codeine Other (See Comments)    Childhood reaction  . Celecoxib Other (See Comments)    CELLBREX-confusion  . Colchicine Diarrhea    diarrhea    ROS Review of Systems  Constitutional: Negative for fever, chills, appetite change and fatigue.  HENT: Positive for hearing loss (chronic). Negative for congestion, dental problem, ear pain and sore throat.   Eyes: Negative for discharge, redness and visual disturbance.  Respiratory: Positive for shortness of breath (chronic DOE). Negative for cough, chest tightness and wheezing.   Cardiovascular: Negative for chest pain, palpitations and leg swelling.  Gastrointestinal: Negative for nausea, vomiting, abdominal pain, diarrhea and blood in stool.  Genitourinary: Negative for dysuria, urgency, frequency, hematuria, flank pain and difficulty urinating.  Musculoskeletal: Negative for myalgias, back pain, joint swelling, arthralgias and neck stiffness.  Skin: Negative for pallor and rash.  Neurological: Negative for dizziness, speech difficulty, weakness and headaches.  Chronic burning feet pain from his periph neuropathy  Hematological:  Negative for adenopathy. Does not bruise/bleed easily.  Psychiatric/Behavioral: Negative for confusion and sleep disturbance. The patient is not nervous/anxious.     PE; Blood pressure 129/66, pulse 76, temperature 97.9 F (36.6 C), temperature source Oral, resp. rate 16, height 6' 0.5" (1.842 m), weight 351 lb 8 oz (159.439 kg), SpO2 97 %. Gen: Alert, well appearing.  Patient is oriented to person, place, time, and situation. AFFECT: pleasant, lucid thought and speech. ENT: Ears: EACs clear, normal epithelium.  TMs with good light reflex and landmarks bilaterally.  Eyes: no injection, icteris, swelling, or exudate.  EOMI, PERRLA. Nose: no drainage or turbinate edema/swelling.  No injection or focal lesion.  Mouth: lips without lesion/swelling.  Oral mucosa pink and moist.  Dentition intact and without obvious caries or gingival swelling.  Oropharynx without erythema, exudate, or swelling.  Neck: supple/nontender.  No LAD, mass, or TM.  Carotid pulses 2+ bilaterally, without bruits.  I can hear his heart murmur transmitting up into the carotids bilat. CV: RRR, 7-0/6 systolic murmur.  No r/g.   LUNGS: CTA bilat, nonlabored resps, good aeration in all lung fields. ABD: soft, NT, ND, BS normal.  No hepatospenomegaly or mass.  No bruits. EXT: no clubbing or cyanosis.  2+ pitting edema bilat, with pretibial flaking but no erythema or skin breakdown. Musculoskeletal: no joint swelling, erythema, warmth, or tenderness.  ROM of all joints intact. Skin - no sores or suspicious lesions or rashes or color changes Rectal exam: negative without mass, lesions or tenderness, PROSTATE EXAM: smooth and symmetric without nodules or tenderness.   Pertinent labs:  Lab Results  Component Value Date   TSH 2.36 01/28/2014   Lab Results  Component Value Date   WBC 8.2 09/03/2015   HGB 12.6* 09/03/2015   HCT 37.1* 09/03/2015   MCV 88.3 09/03/2015   PLT 146* 09/03/2015   Lab Results  Component Value Date    CREATININE 0.77 09/10/2015   BUN 19 09/10/2015   NA 140 09/10/2015   K 4.3 09/10/2015   CL 102 09/10/2015   CO2 23 09/10/2015   Lab Results  Component Value Date   ALT 21 09/10/2015   AST 23 09/10/2015   ALKPHOS 64 09/10/2015   BILITOT 0.8 09/10/2015   Lab Results  Component Value Date   CHOL 115 10/07/2014   Lab Results  Component Value Date   HDL 61.10 10/07/2014   Lab Results  Component Value Date   LDLCALC 39 10/07/2014   Lab Results  Component Value Date   TRIG 75.0 10/07/2014   Lab Results  Component Value Date   CHOLHDL 2 10/07/2014   Lab Results  Component Value Date   PSA 0.33 10/07/2014   PSA 0.40 09/25/2013   Lab Results  Component Value Date   HGBA1C 6.0* 09/03/2015   Lab Results  Component Value Date   TSH 2.36 01/28/2014   ASSESSMENT AND PLAN:   Health maintenance exam: Reviewed age and gender appropriate health maintenance issues (prudent diet, regular exercise, health risks of tobacco and excessive alcohol, use of seatbelts, fire alarms in home, use of sunscreen).  Also reviewed age and gender appropriate health screening as well as vaccine recommendations. No vaccines due. Colon cancer screening: consider repeat colonoscopy 2019--per GI MD recommendations. Protate cancer screening: after today, pt chooses to do no further prostate cancer screening. Hearing impairment: referred pt to [facility] for second opinion on hearing and hearing aids.  FOLLOW UP:  Return in about 4 months (around 03/25/2016) for routine chronic illness f/u (30 min): also, lab visit for POCT HbA1c after 12/04/15.  Signed:  Crissie Sickles, MD           11/23/2015

## 2015-12-01 ENCOUNTER — Telehealth: Payer: Self-pay

## 2015-12-05 ENCOUNTER — Other Ambulatory Visit: Payer: Self-pay | Admitting: Family Medicine

## 2015-12-07 ENCOUNTER — Other Ambulatory Visit: Payer: Self-pay

## 2015-12-07 DIAGNOSIS — I4891 Unspecified atrial fibrillation: Secondary | ICD-10-CM

## 2015-12-07 MED ORDER — METOPROLOL SUCCINATE ER 25 MG PO TB24: 25.0000 mg | ORAL_TABLET | Freq: Every day | ORAL | Status: DC

## 2015-12-14 ENCOUNTER — Other Ambulatory Visit (INDEPENDENT_AMBULATORY_CARE_PROVIDER_SITE_OTHER): Payer: Medicare Other

## 2015-12-14 DIAGNOSIS — R7301 Impaired fasting glucose: Secondary | ICD-10-CM

## 2015-12-14 LAB — POCT GLYCOSYLATED HEMOGLOBIN (HGB A1C): HEMOGLOBIN A1C: 5.4

## 2015-12-23 ENCOUNTER — Encounter: Payer: Self-pay | Admitting: Physical Medicine & Rehabilitation

## 2015-12-23 ENCOUNTER — Ambulatory Visit (HOSPITAL_BASED_OUTPATIENT_CLINIC_OR_DEPARTMENT_OTHER): Payer: Medicare Other | Admitting: Physical Medicine & Rehabilitation

## 2015-12-23 ENCOUNTER — Encounter: Payer: Medicare Other | Attending: Registered Nurse

## 2015-12-23 VITALS — BP 121/77 | HR 65

## 2015-12-23 DIAGNOSIS — G609 Hereditary and idiopathic neuropathy, unspecified: Secondary | ICD-10-CM | POA: Insufficient documentation

## 2015-12-23 DIAGNOSIS — M109 Gout, unspecified: Secondary | ICD-10-CM | POA: Insufficient documentation

## 2015-12-23 DIAGNOSIS — I2510 Atherosclerotic heart disease of native coronary artery without angina pectoris: Secondary | ICD-10-CM | POA: Insufficient documentation

## 2015-12-23 DIAGNOSIS — K219 Gastro-esophageal reflux disease without esophagitis: Secondary | ICD-10-CM | POA: Diagnosis not present

## 2015-12-23 DIAGNOSIS — M1990 Unspecified osteoarthritis, unspecified site: Secondary | ICD-10-CM | POA: Diagnosis not present

## 2015-12-23 DIAGNOSIS — J449 Chronic obstructive pulmonary disease, unspecified: Secondary | ICD-10-CM | POA: Insufficient documentation

## 2015-12-23 DIAGNOSIS — E7800 Pure hypercholesterolemia, unspecified: Secondary | ICD-10-CM | POA: Diagnosis not present

## 2015-12-23 DIAGNOSIS — M79672 Pain in left foot: Secondary | ICD-10-CM | POA: Diagnosis present

## 2015-12-23 MED ORDER — TAPENTADOL HCL 75 MG PO TABS: 75.0000 mg | ORAL_TABLET | Freq: Four times a day (QID) | ORAL | Status: DC | PRN

## 2015-12-23 NOTE — Patient Instructions (Addendum)
Tapentadol extended-release tablets What is this medicine? TAPENTADOL (ta PEN ta dol) is a pain reliever. It is used to treat moderate to severe pain that lasts for more than a few days. It is also used to treat nerve pain caused by diabetes. This medicine may be used for other purposes; ask your health care provider or pharmacist if you have questions. What should I tell my health care provider before I take this medicine? They need to know if you have any of these conditions: -Addison's disease -gallbladder disease -head injury -history of a drug or alcohol abuse problem -if you often drink alcohol -kidney disease -liver disease -low blood pressure -lung or breathing disease, like asthma -mental illness -prostate disease -seizures -stomach or intestine problems -thyroid disease -an unusual or allergic reaction to tapentadol, other medicines, foods, dyes, or preservatives -pregnant or trying to get pregnant -breast-feeding How should I use this medicine? Take this medicine by mouth with a glass of water. Do not cut, crush, or chew this medicine. Do not take a tablet that is not whole. A broken or crushed tablet can be very dangerous. You may get too much medicine. Swallow only one tablet at a time. Do not wet, soak, or lick the tablet before you take it. You can take it with or without food. If it upsets your stomach, take it with food. Follow the directions on the prescription label. Take your medicine at regular intervals. Do not take it more often than directed. Do not stop taking except on your doctor's advice. A special MedGuide will be given to you by the pharmacist with each prescription and refill. Be sure to read this information carefully each time. Talk to your pediatrician regarding the use of this medicine in children. Special care may be needed. Overdosage: If you think you have taken too much of this medicine contact a poison control center or emergency room at once. NOTE:  This medicine is only for you. Do not share this medicine with others. What if I miss a dose? If you miss a dose, take it as soon as you can. If it is almost time for your next dose, take only that dose. Do not take double or extra doses. What may interact with this medicine? Do not take this medicine with any of the following medications: -alcohol or any product that contains alcohol -MAOIs like Carbex, Eldepryl, Marplan, Nardil, and Parnate This medicine may also interact with the following medications: -antihistamines for allergy, cough, and cold -atropine -buprenorphine -certain medicines for bladder problems like oxybutynin, tolterodine -certain medicines for depression, anxiety, or psychotic disturbances -certain medicines for migraine headache like almotriptan, eletriptan, frovatriptan, naratriptan, rizatriptan, sumatriptan, zolmitriptan -certain medicines for Parkinson's disease like benztropine, trihexyphenidyl -certain medicines for stomach problems like dicyclomine, hyoscyamine -certain medicines for travel sickness like scopolamine -ipratropium -medicines for seizures like carbamazepine, phenobarbital, phenytoin -medicines for sleep -muscle relaxants -narcotic medicines (opiates) for pain -phenothiazines like chlorpromazine, mesoridazine, prochlorperazine, thioridazine This list may not describe all possible interactions. Give your health care provider a list of all the medicines, herbs, non-prescription drugs, or dietary supplements you use. Also tell them if you smoke, drink alcohol, or use illegal drugs. Some items may interact with your medicine. What should I watch for while using this medicine? Tell your doctor or health care professional if your pain does not go away, if it gets worse, or if you have new or a different type of pain. You may develop tolerance to the medicine. Tolerance means that  you will need a higher dose of the medicine for pain relief. Tolerance is  normal and is expected if you take the medicine for a long time. Do not suddenly stop taking your medicine because you may develop a severe reaction. Your body becomes used to the medicine. This does NOT mean you are addicted. Addiction is a behavior related to getting and using a drug for a non-medical reason. If you have pain, you have a medical reason to take pain medicine. Your doctor will tell you how much medicine to take. If your doctor wants you to stop the medicine, the dose will be slowly lowered over time to avoid any side effects. You may get drowsy or dizzy. Do not drive, use machinery, or do anything that needs mental alertness until you know how this medicine affects you. Do not stand or sit up quickly, especially if you are an older patient. This reduces the risk of dizzy or fainting spells. Do not drink, eat, or take anything that may contain alcohol. Alcohol makes this medicine work incorrectly. You may have very dangerous side effects if you take the medicine with any alcohol. There are different types of narcotic medicines (opiates) for pain. If you take more than one type at the same time, you may have more side effects. Give your health care provider a list of all medicines you use. Your doctor will tell you how much medicine to take. Do not take more medicine than directed. Call emergency for help if you have problems breathing. This medicine will cause constipation. Try to have a bowel movement at least every 2 to 3 days. If you do not have a bowel movement for 3 days, call your doctor or health care professional. What side effects may I notice from receiving this medicine? Side effects that you should report to your doctor or health care professional as soon as possible: -allergic reactions like skin rash, itching or hives, swelling of the face, lips, or tongue -anxious -breathing problems -changes in vision -confusion -fast, irregular heartbeat -feeling faint or lightheaded,  falls -hallucinations -muscle spasms -seizures -trouble passing urine or change in the amount of urine -unusually weak or tired Side effects that usually do not require medical attention (Report these to your doctor or health care professional if they continue or are bothersome.): -changes in emotions or moods -constipation -headache -nausea, vomiting -sweating -upset stomach This list may not describe all possible side effects. Call your doctor for medical advice about side effects. You may report side effects to FDA at 1-800-FDA-1088. Where should I keep my medicine? Keep out of the reach of children. This medicine can be abused. Keep your medicine in a safe place to protect it from theft. Do not share this medicine with anyone. Selling or giving away this medicine is dangerous and against the law. Store at room temperature between 15 and 30 degrees C (59 and 86 degrees F). Protect from moisture. This medicine may cause accidental overdose and death if it is taken by other adults, children, or pets. Flush any unused medicine down the toilet to reduce the chance of harm. Do not use the medicine after the expiration date. NOTE: This sheet is a summary. It may not cover all possible information. If you have questions about this medicine, talk to your doctor, pharmacist, or health care provider.    2016, Elsevier/Gold Standard. (2013-01-15 16:38:44)  We discussed stopping the tramadol while taking the Nucynta

## 2015-12-23 NOTE — Progress Notes (Signed)
Subjective:    Patient ID: Tim [name], male    DOB: [birth year], 75 y.o.   MRN: [id number]  HPI   Has undergone spinal cord stimulation trial as well as permanent placement in March 2017. Patient had very good results with the trial stimulation. Overall doing better after the permanent however it is not as effective in relieving the sharp pain that interferes with sleep as the trial stimulation. Has had follow-up with [facility].  Pain Inventory Average Pain 8 Pain Right Now 4 My pain is sharp, dull, stabbing, tingling and aching  In the last 24 hours, has pain interfered with the following? General activity 8 Relation with others 5 Enjoyment of life 6 What TIME of day is your pain at its worst? night and evening Sleep (in general) Poor  Pain is worse with: walking, bending, standing and some activites Pain improves with: rest, pacing activities, medication and injections Relief from Meds: 6  Mobility use a cane how many minutes can you walk? 3 ability to climb steps?  no do you drive?  yes  Function retired I need assistance with the following:  household duties and shopping  Neuro/Psych weakness numbness tremor tingling trouble walking  Prior Studies Any changes since last visit?  no  Physicians involved in your care Any changes since last visit?  no   Family History  Problem Relation Age of Onset  . Hypertension Mother   . Coronary artery disease Mother   . Heart attack Mother   . Pulmonary fibrosis Father     asbestosis  . Neuropathy Mother    Social History   Social History  . Marital Status: Married    Spouse Name: [name]  . Number of Children: 0  . Years of Education: 20   Occupational History  . engineering     retired   Social History Main Topics  . Smoking status: Never Smoker   . Smokeless tobacco: Never Used  . Alcohol Use: No  . Drug Use: No  . Sexual Activity: Not Currently   Other Topics Concern  .  None   Social History Narrative   HSG, [name] - BS, [name] - MS-engineering, 2 years on PhD - [name]. Married - '65 - 24yr/divorced; '76- 3 yrs/divorced; '92 . No children. 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 ACP/Living Will - Yes CPR; long-term Mechanical ventilation as long as he was able to cognate; ok for long term artificial nutrition. Precondition being able to cognate and not to have too much pain.    Past Surgical History  Procedure Laterality Date  . Hemorrhoid surgery    . Vitrectomy    . Intraocular lens insertion Bilateral   . Leg surgery Bilateral     lenghtening   . Knee surgery Right   . Traeculectomy Left     eye  . Shoulder arthroscopy  08/30/2011    Procedure: ARTHROSCOPY SHOULDER;  Surgeon: [name] MD;  [place] [facility]  Service: Orthopedics;  Laterality: Right;  Right Shoulder Arthroscopy, Debridement, and Decompression  . Tonsillectomy    . Colonoscopy    . Cholecystectomy    . 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  R big toe surg due to osteoarth  . Colonoscopy [name] 10/30/2012    Procedure: COLONOSCOPY;  Surgeon: CGatha Mayer MD;  Location: [facility];  Service: Endoscopy;  Laterality: [name];  . Cardiac catheterization  1997    Non-obstructive  disease  . Cardiac catheterization  12/24/2012    mild < 20% LCx, prox 30% RCA; LVEF 55-65% , moderate pulmonary HTN, moderate AS  . Eye surgery Bilateral cataract  . Amputation Left 04/11/2013    Procedure: AMPUTATION DIGIT Left 3rd toe;  Surgeon: [name], MD;  Location: [place];  Service: Orthopedics;  Laterality: Left;  Left 3rd toe amputation at [facility]  . Left and right heart catheterization with coronary angiogram [name] 12/24/2012    Procedure: LEFT AND RIGHT HEART CATHETERIZATION WITH CORONARY ANGIOGRAM;  Surgeon: [name], MD;  Location: [facility];  Service: Cardiovascular;  Laterality: [name];  . Transthoracic echocardiogram  01/2015    No signif change in  aortic stenosis (moderate).  Severe LVH w/small LV cavity, EF 60-65%, grade I diast dysfxn.  [name] Spinal cord stimulator insertion [name] 09/10/2015    Procedure: LUMBAR SPINAL CORD STIMULATOR INSERTION;  Surgeon: [name], MD;  Location: [facility];  Service: Neurosurgery;  Laterality: [name];   Past Medical History  Diagnosis Date  . HYPERCHOLESTEROLEMIA-PURE   . GOUT   . OSA on CPAP     8 cm H2O  . Essential hypertension, benign   . VENOUS INSUFFICIENCY   . ASTHMA   . COPD   . VENTRAL HERNIA   . OSTEOARTHRITIS   . CATARACT, HX OF   . PULMONARY HYPERTENSION, HX OF   . Hallux valgus (acquired)   . Obesity, unspecified   . Osteoarthrosis, unspecified whether generalized or localized, unspecified site   . Unspecified glaucoma   . Moderate aortic stenosis     Per 12/2012 echo- (VTI 1.08 cm^2, Vmax 1.11 cm ^2), mild LA dilatation  . Paroxysmal atrial fibrillation ([place]) 2003    Off anticoag for a while due to falls.  Then apixaban started 12/2014.  . Lumbosacral neuritis   . Unspecified hereditary and idiopathic peripheral neuropathy approx age 32    bilat LE's, ? left arm, too.  Feet became progressively numb + left foot pain intermittently.  Pt may be trying a spinal stimulator (as of 05/2015)  . Lumbosacral spondylosis   . CAD (coronary artery disease)     Nonobstructive by 12/2012 cath  . PUD (peptic ulcer disease)   . BPH (benign prostatic hypertrophy)   . HH (hiatus hernia)   . Glaucoma   . GERD (gastroesophageal reflux disease)   . Personal history of colonic adenoma 10/30/2012    Diminutive adenoma, consider repeat 2019 per GI  . Shortness of breath     with exertion  . Peripheral neuropathy ([place])     [facility]; with chronic neuropathic pain (Dr. Ella Bodo)  . Normal memory function 08/2014    Neuropsychological testing ([facility]): no cognitive impairment or sign of neurodegenerative disorder.  Likely has adjustment d/o with mixed anxiety/depressed features  and may benefit from low dose SNRI.    . IFG (impaired fasting glucose)     HbA1c jumped from 5.7% to 6.1% 03/2015---started metformin at that time.  . Obesity hypoventilation syndrome (H[facility])    BP 121/77 mmHg  Pulse 65  SpO2 96%  Opioid Risk Score:   Fall Risk Score:  `1  Depression screen PHQ 2/9  Depression screen [name] 2/9 09/23/2015 07/15/2015 03/16/2015 02/16/2015 09/16/2014  Decreased Interest 0 0 0 0 1  Down, Depressed, Hopeless 0 0 0 0 0  PHQ - 2 Score 0 0 0 0 1  Altered sleeping - - - - 2  Tired, decreased energy - - - -  2  Change in appetite - - - - 1  Feeling bad or failure about yourself  - - - - 0  Trouble concentrating - - - - 0  Moving slowly or fidgety/restless - - - - 0  Suicidal thoughts - - - - 0  PHQ-9 Score - - - - 6     Review of Systems  HENT: Negative.   Eyes: Negative.   Respiratory: Negative.   Cardiovascular: Negative.   Gastrointestinal: Negative.   Endocrine: Negative.   Genitourinary: Negative.   Musculoskeletal: Positive for back pain, arthralgias and gait problem.  Skin: Negative.   Allergic/Immunologic: Negative.   Neurological: Positive for weakness and numbness.  Hematological: Negative.   Psychiatric/Behavioral: Negative.        Objective:   Physical Exam  Constitutional: He appears well-developed and well-nourished.  HENT:  Head: Normocephalic and atraumatic.  Eyes: Conjunctivae are normal. Pupils are equal, round, and reactive to light.  Neck: Normal range of motion.  Musculoskeletal:       Lumbar back: He exhibits deformity. He exhibits no tenderness.  Strength is 4+ bilateral hip flexor and extensor ankle dorsiflexors Spinal cord stimulator battery at the gluteus medius area right  Iliac crest  Neurological: He displays no atrophy. Gait abnormal.  Negative straight leg raising test  Gait is using a cane evidence of true drag or knee instability  Psychiatric: His speech is normal. Judgment and thought content normal. His  affect is blunt. He is slowed. Cognition and memory are normal.  Nursing note and vitals reviewed.         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 Given the primary problem is neuropathic and he is already on  A large dose of gabapentin would recommend  Stopping oxycodone  10 mg 4 times a day and starting Nucynta 75 mg 4 times a day. We discussed the low probability of  Withdrawal reaction since we are substituting one opioid from another. He had questions about addiction versus dependence and we discussed this. He had an episode where he could not take his pain medicine for 24 hours and had some withdrawal symptoms. We discussed that this does not imply addiction but dependence.  2. Chronic low back pain as well as some radicular discomfort. He's undergone spinal cord stimulation which was helpful for his neuropathic pain  Particularly during the trial phase but with the permanent implant does not seem to be as effective. He will follow up with the [employer] and try to work out stimulation parameters. He will follow up with the implanter Dr. Maryjean Ka  Return to clinic one month with nurse practitioner it is possible his Nucynta may need to be increased to 100 mg Because he tends to take only 1 tablet of pain medicine in the morning and 3 tablets at night would hesitate in switching to extended release uunless the dosage can be adjusted such that he takes only 1 extended release tablet in the morning and 2 and the evening  Over half of the 25 min visit was spent counseling and coordinating care.

## 2015-12-24 NOTE — Telephone Encounter (Signed)
Patient is on the list for [employer] 2017 and may be a good candidate for an AWV in 2017. Please let me know if/when appt is scheduled.   

## 2015-12-26 ENCOUNTER — Other Ambulatory Visit: Payer: Self-pay | Admitting: Physical Medicine & Rehabilitation

## 2015-12-27 ENCOUNTER — Other Ambulatory Visit: Payer: Self-pay

## 2015-12-27 MED ORDER — METOPROLOL SUCCINATE ER 50 MG PO TB24: 50.0000 mg | ORAL_TABLET | Freq: Every day | ORAL | Status: DC

## 2015-12-28 NOTE — Progress Notes (Signed)
HPI:  FU paroxysmal atrial fibrillation, sleep apnea, and pedal edema. Carotid Dopplers in June of 2014 showed no significant stenosis. Cardiac catheterization in June of 2014 showed pulmonary artery pressure of 51/26 and pulmonary capillary wedge pressure of 20. Aortic valve area of 1.4 cm. There was a 20% circumflex, 30% right coronary artery. There was no disease in the LAD. Ejection fraction 55-65%. Medical therapy recommended. Chest CT July 2015 showed no acute disease. Last echocardiogram July 2016 showed normal LV systolic function, severe left ventricular hypertrophy, moderate to severe aortic stenosis with mean gradient 28 mmHg, mild mitral regurgitation and biatrial enlargement. Since I last saw him, He does have dyspnea on exertion but no orthopnea or PND. No chest pain or syncope. Chronic mild pedal edema unchanged.  Current Outpatient Prescriptions  Medication Sig Dispense Refill  . albuterol (PROVENTIL HFA;VENTOLIN HFA) 108 (90 BASE) MCG/ACT inhaler Inhale 2 puffs into the lungs 4 (four) times daily as needed for wheezing or shortness of breath.    . allopurinol (ZYLOPRIM) 300 MG tablet take 1 tablet by mouth once daily with food 30 tablet 11  . apixaban (ELIQUIS) 5 MG TABS tablet Take 1 tablet (5 mg total) by mouth 2 (two) times daily. 180 tablet 1  . B Complex-C (B-COMPLEX WITH VITAMIN C) tablet Take 1 tablet by mouth daily.    . clotrimazole-betamethasone (LOTRISONE) cream apply to affected area twice a day 45 g 5  . ezetimibe (ZETIA) 10 MG tablet Take 1 tablet (10 mg total) by mouth daily. 90 tablet 0  . finasteride (PROSCAR) 5 MG tablet take 1 tablet by mouth once daily 90 tablet 1  . fluticasone (FLONASE) 50 MCG/ACT nasal spray Place 2 sprays into both nostrils as needed for allergies or rhinitis.    . Fluticasone-Salmeterol (ADVAIR) 250-50 MCG/DOSE AEPB Inhale 1 puff into the lungs daily as needed (for nasal congestion).     . gabapentin (NEURONTIN) 600 MG tablet take 1  tablet by mouth four times a day 120 tablet 3  . metFORMIN (GLUCOPHAGE) 500 MG tablet take 1 tablet by mouth twice a day with food 60 tablet 6  . metoprolol succinate (TOPROL XL) 25 MG 24 hr tablet Take 1 tablet (25 mg total) by mouth daily. To be taken with 50 mg for total of 75 mg daily 90 tablet 0  . metoprolol succinate (TOPROL-XL) 50 MG 24 hr tablet Take 1 tablet (50 mg total) by mouth daily. To be taken with 25 mg for total of 75 mg daily 30 tablet 6  . Multiple Vitamin (MULTIVITAMIN) capsule Take 1 capsule by mouth daily.    . Niacin CR 1000 MG TBCR Take 1 tablet (1,000 mg total) by mouth 2 (two) times daily. 180 each 3  . omeprazole (PRILOSEC) 10 MG capsule take 1 capsule by mouth once daily 30 capsule 11  . Oxycodone HCl 10 MG TABS Take 1 tablet (10 mg total) by mouth every 6 (six) hours as needed. 120 tablet 0  . potassium chloride SA (K-DUR,KLOR-CON) 20 MEQ tablet take 1 tablet by mouth once daily 90 tablet 1  . rosuvastatin (CRESTOR) 40 MG tablet Take 1 tablet (40 mg total) by mouth daily. 90 tablet 0  . traMADol-acetaminophen (ULTRACET) 37.5-325 MG tablet take 1 tablet by mouth every 6 hours if needed for pain 120 tablet 3  . VITAMIN D, CHOLECALCIFEROL, PO Take 1 tablet by mouth daily.      No current facility-administered medications for this visit.  Past Medical History  Diagnosis Date  . HYPERCHOLESTEROLEMIA-PURE   . GOUT   . OSA on CPAP     8 cm H2O  . Essential hypertension, benign   . VENOUS INSUFFICIENCY   . ASTHMA   . COPD   . VENTRAL HERNIA   . OSTEOARTHRITIS   . CATARACT, HX OF   . PULMONARY HYPERTENSION, HX OF   . Hallux valgus (acquired)   . Obesity, unspecified   . Osteoarthrosis, unspecified whether generalized or localized, unspecified site   . Unspecified glaucoma   . Moderate aortic stenosis     Per 12/2012 echo- (VTI 1.08 cm^2, Vmax 1.11 cm ^2), mild LA dilatation  . Paroxysmal atrial fibrillation ([place]) 2003    Off anticoag for a while due to  falls.  Then apixaban started 12/2014.  . Lumbosacral neuritis   . Unspecified hereditary and idiopathic peripheral neuropathy approx age 19    bilat LE's, ? left arm, too.  Feet became progressively numb + left foot pain intermittently.  Pt may be trying a spinal stimulator (as of 05/2015)  . Lumbosacral spondylosis   . CAD (coronary artery disease)     Nonobstructive by 12/2012 cath  . PUD (peptic ulcer disease)   . BPH (benign prostatic hypertrophy)   . HH (hiatus hernia)   . Glaucoma   . GERD (gastroesophageal reflux disease)   . Personal history of colonic adenoma 10/30/2012    Diminutive adenoma, consider repeat 2019 per GI  . Shortness of breath     with exertion  . Peripheral neuropathy ([place])     Heredetary; with chronic neuropathic pain (Dr. Ella Bodo)  . Normal memory function 08/2014    Neuropsychological testing ([facility]): no cognitive impairment or sign of neurodegenerative disorder.  Likely has adjustment d/o with mixed anxiety/depressed features and may benefit from low dose SNRI.    . IFG (impaired fasting glucose)     HbA1c jumped from 5.7% to 6.1% 03/2015---started metformin at that time.  . Obesity hypoventilation syndrome [facility])     Past Surgical History  Procedure Laterality Date  . Hemorrhoid surgery    . Vitrectomy    . Intraocular lens insertion Bilateral   . Leg surgery Bilateral     lenghtening   . Knee surgery Right   . Traeculectomy Left     eye  . Shoulder arthroscopy  08/30/2011    Procedure: ARTHROSCOPY SHOULDER;  Surgeon: [name], MD;  Location: [place];  Service: Orthopedics;  Laterality: Right;  Right Shoulder Arthroscopy, Debridement, and Decompression  . Tonsillectomy    . Colonoscopy    . Cholecystectomy    . 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  R big toe surg due to osteoarth  . Colonoscopy [name] 10/30/2012    Procedure: COLONOSCOPY;  Surgeon: [name], MD;  Location: [facility];  Service: Endoscopy;   Laterality: [name];  . Cardiac catheterization  1997    Non-obstructive disease  . Cardiac catheterization  12/24/2012    mild < 20% LCx, prox 30% RCA; LVEF 55-65% , moderate pulmonary HTN, moderate AS  . Eye surgery Bilateral cataract  . Amputation Left 04/11/2013    Procedure: AMPUTATION DIGIT Left 3rd toe;  Surgeon: [name], MD;  Location: [place];  Service: Orthopedics;  Laterality: Left;  Left 3rd toe amputation at [facility]  . Left and right heart catheterization with coronary angiogram [name] 12/24/2012    Procedure: LEFT AND RIGHT HEART CATHETERIZATION WITH CORONARY  Cyril Loosen;  Surgeon: [name], MD;  Location: [name] CATH LAB;  Service: Cardiovascular;  Laterality: [name];  . Transthoracic echocardiogram  01/2015    No signif change in aortic stenosis (moderate).  Severe LVH w/small LV cavity, EF 60-65%, grade I diast dysfxn.  [name] Spinal cord stimulator insertion [name] 09/10/2015    Procedure: LUMBAR SPINAL CORD STIMULATOR INSERTION;  Surgeon: [name], MD;  Location: [place] NEURO ORS;  Service: Neurosurgery;  Laterality: [name];    Social History   Social History  . Marital Status: Married    Spouse Name: [name]  . Number of Children: 0  . Years of Education: 20   Occupational History  . engineering     retired   Social History Main Topics  . Smoking status: Never Smoker   . Smokeless tobacco: Never Used  . Alcohol Use: No  . Drug Use: No  . Sexual Activity: Not Currently   Other Topics Concern  . Not on file   Social History Narrative   HSG, [name] - BS, [name] - MS-engineering, 2 years on PhD - [employer]. Married - '65 - 7yr/divorced; '76- 3 yrs/divorced; '92 . No children. 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 ACP/Living Will - Yes CPR; long-term Mechanical ventilation as long as he was able to cognate; ok for long term artificial nutrition. Precondition being able to cognate and not to have too much pain.     Family History  Problem Relation  Age of Onset  . Hypertension Mother   . Coronary artery disease Mother   . Heart attack Mother   . Pulmonary fibrosis Father     asbestosis  . Neuropathy Mother     ROS: Chronic back pain and peripheral neuropathy but no fevers or chills, productive cough, hemoptysis, dysphasia, odynophagia, melena, hematochezia, dysuria, hematuria, rash, seizure activity, orthopnea, PND, claudication. Remaining systems are negative.  Physical Exam: Well-developed obese in no acute distress.  Skin is warm and dry.  HEENT is normal.  Neck is supple.  Chest is clear to auscultation with normal expansion.  Cardiovascular exam is regular rate and rhythm. 3/6 systolic murmur left sternal border. S2 is preserved. Abdominal exam nontender or distended. No masses palpated. Extremities show 1+ edema and chronic skin changes neuro grossly intact  ECG Sinus rhythm at a rate of 70. Right bundle branch block.  Assessment and plan 1 Aortic stenosis-repeat echocardiogram.  2 paroxysmal atrial fibrillation-continue metoprolol. Continue apixaban. 3 hyperlipidemia-continue statin. 4 hypertension-blood pressure controlled. Continue present medications. 5 coronary artery disease-continue statin. No aspirin given need for anticoagulation. 6 dyspnea-multifactorial including deconditioning, obesity hypoventilation syndrome,Obstructive sleep apnea and diastolic congestive heart failure.  BKirk Ruths MD

## 2015-12-31 ENCOUNTER — Telehealth: Payer: Self-pay | Admitting: *Deleted

## 2015-12-31 MED ORDER — OXYCODONE HCL 10 MG PO TABS: 10.0000 mg | ORAL_TABLET | Freq: Four times a day (QID) | ORAL | Status: DC | PRN

## 2015-12-31 NOTE — Telephone Encounter (Signed)
Nucynta discontinued due to cost. Resume Oxycodone. Accordoing to [place] Oxycodone was picked up on 12/09/15. [name] will pick up his prescription. He has a follow up appointment on 01/20/16 Spoke with [name] he is aware and verbalizes understanding.

## 2015-12-31 NOTE — Telephone Encounter (Signed)
Patient left a message stating that he cannot afford to pay for Nucynta 75 mg. I ran a prior auth to see if that was the problem. Prior authorization was approved however patients cost is still $291.00 a month.  Dr. Letta Pate is on vacation, so what should we do....Marland KitchenMarland Kitchen

## 2016-01-05 ENCOUNTER — Encounter: Payer: Self-pay | Admitting: Cardiology

## 2016-01-05 ENCOUNTER — Ambulatory Visit (INDEPENDENT_AMBULATORY_CARE_PROVIDER_SITE_OTHER): Payer: Medicare Other | Admitting: Cardiology

## 2016-01-05 VITALS — BP 132/68 | HR 70 | Ht 72.5 in | Wt 339.4 lb

## 2016-01-05 DIAGNOSIS — I5032 Chronic diastolic (congestive) heart failure: Secondary | ICD-10-CM

## 2016-01-05 DIAGNOSIS — I350 Nonrheumatic aortic (valve) stenosis: Secondary | ICD-10-CM

## 2016-01-05 DIAGNOSIS — I10 Essential (primary) hypertension: Secondary | ICD-10-CM

## 2016-01-05 DIAGNOSIS — E785 Hyperlipidemia, unspecified: Secondary | ICD-10-CM | POA: Diagnosis not present

## 2016-01-05 DIAGNOSIS — I2510 Atherosclerotic heart disease of native coronary artery without angina pectoris: Secondary | ICD-10-CM

## 2016-01-05 DIAGNOSIS — I480 Paroxysmal atrial fibrillation: Secondary | ICD-10-CM | POA: Diagnosis not present

## 2016-01-05 DIAGNOSIS — I359 Nonrheumatic aortic valve disorder, unspecified: Secondary | ICD-10-CM

## 2016-01-05 NOTE — Patient Instructions (Signed)
Medication Instructions:   NO CHANGE  Testing/Procedures:  Your physician has requested that you have an echocardiogram. Echocardiography is a painless test that uses sound waves to create images of your heart. It provides your doctor with information about the size and shape of your heart and how well your heart's chambers and valves are working. This procedure takes approximately one hour. There are no restrictions for this procedure.    Follow-Up:  Your physician wants you to follow-up in: [place] will receive a reminder letter in the mail two months in advance. If you don't receive a letter, please call our office to schedule the follow-up appointment.

## 2016-01-19 ENCOUNTER — Ambulatory Visit (HOSPITAL_BASED_OUTPATIENT_CLINIC_OR_DEPARTMENT_OTHER): Payer: Medicare Other

## 2016-01-20 ENCOUNTER — Ambulatory Visit: Payer: Medicare Other | Admitting: Registered Nurse

## 2016-01-27 ENCOUNTER — Ambulatory Visit (HOSPITAL_BASED_OUTPATIENT_CLINIC_OR_DEPARTMENT_OTHER)
Admission: RE | Admit: 2016-01-27 | Discharge: 2016-01-27 | Disposition: A | Discharge status: Home / Self Care | Payer: Medicare Other | Source: Ambulatory Visit | Attending: Cardiology | Admitting: Cardiology

## 2016-01-27 DIAGNOSIS — I350 Nonrheumatic aortic (valve) stenosis: Secondary | ICD-10-CM | POA: Diagnosis not present

## 2016-01-27 DIAGNOSIS — I050 Rheumatic mitral stenosis: Secondary | ICD-10-CM | POA: Diagnosis not present

## 2016-01-27 DIAGNOSIS — I059 Rheumatic mitral valve disease, unspecified: Secondary | ICD-10-CM | POA: Insufficient documentation

## 2016-01-27 DIAGNOSIS — I517 Cardiomegaly: Secondary | ICD-10-CM | POA: Diagnosis not present

## 2016-01-27 DIAGNOSIS — I352 Nonrheumatic aortic (valve) stenosis with insufficiency: Secondary | ICD-10-CM | POA: Insufficient documentation

## 2016-01-27 MED ORDER — PERFLUTREN LIPID MICROSPHERE
1.0000 mL | INTRAVENOUS | Status: AC | PRN
  Administered 2016-01-27: 2 mL via INTRAVENOUS
  Filled 2016-01-27: qty 10

## 2016-01-27 MED FILL — Perflutren Lipid Microsphere IV Susp 1.1 MG/ML: INTRAVENOUS | Qty: 10 | Status: AC

## 2016-01-27 NOTE — Progress Notes (Signed)
Echocardiogram 2D Echocardiogram has been performed with definity.  Aggie Cosier 01/27/2016, 3:26 PM

## 2016-01-27 NOTE — ED Notes (Signed)
Pt tolerated procedure well.  No adverse reactions noted to [facility].

## 2016-01-28 ENCOUNTER — Encounter: Payer: Medicare Other | Attending: Registered Nurse | Admitting: Registered Nurse

## 2016-01-28 ENCOUNTER — Encounter: Payer: Self-pay | Admitting: Registered Nurse

## 2016-01-28 VITALS — BP 162/92 | HR 76

## 2016-01-28 DIAGNOSIS — Z79899 Other long term (current) drug therapy: Secondary | ICD-10-CM

## 2016-01-28 DIAGNOSIS — G894 Chronic pain syndrome: Secondary | ICD-10-CM | POA: Diagnosis not present

## 2016-01-28 DIAGNOSIS — G609 Hereditary and idiopathic neuropathy, unspecified: Secondary | ICD-10-CM | POA: Diagnosis not present

## 2016-01-28 DIAGNOSIS — M545 Low back pain, unspecified: Secondary | ICD-10-CM

## 2016-01-28 DIAGNOSIS — M109 Gout, unspecified: Secondary | ICD-10-CM | POA: Insufficient documentation

## 2016-01-28 DIAGNOSIS — M1990 Unspecified osteoarthritis, unspecified site: Secondary | ICD-10-CM | POA: Diagnosis not present

## 2016-01-28 DIAGNOSIS — J449 Chronic obstructive pulmonary disease, unspecified: Secondary | ICD-10-CM | POA: Diagnosis not present

## 2016-01-28 DIAGNOSIS — M4806 Spinal stenosis, lumbar region: Secondary | ICD-10-CM | POA: Diagnosis not present

## 2016-01-28 DIAGNOSIS — G8929 Other chronic pain: Secondary | ICD-10-CM

## 2016-01-28 DIAGNOSIS — E7800 Pure hypercholesterolemia, unspecified: Secondary | ICD-10-CM | POA: Diagnosis not present

## 2016-01-28 DIAGNOSIS — M48061 Spinal stenosis, lumbar region without neurogenic claudication: Secondary | ICD-10-CM

## 2016-01-28 DIAGNOSIS — Z5181 Encounter for therapeutic drug level monitoring: Secondary | ICD-10-CM

## 2016-01-28 DIAGNOSIS — I2510 Atherosclerotic heart disease of native coronary artery without angina pectoris: Secondary | ICD-10-CM | POA: Insufficient documentation

## 2016-01-28 DIAGNOSIS — K219 Gastro-esophageal reflux disease without esophagitis: Secondary | ICD-10-CM | POA: Insufficient documentation

## 2016-01-28 DIAGNOSIS — M79672 Pain in left foot: Secondary | ICD-10-CM | POA: Diagnosis present

## 2016-01-28 MED ORDER — TAPENTADOL HCL 100 MG PO TABS: 100.0000 mg | ORAL_TABLET | Freq: Four times a day (QID) | ORAL | 0 refills | Status: DC | PRN

## 2016-01-28 NOTE — Progress Notes (Signed)
Subjective:    Patient ID: Tim [name], male    DOB: 1941-06-09, 75 y.o.   MRN: [id number]  HPI:  Tim [name] is a 76 year old male who returns for follow up for chronic pain and medication refill. He states his pain is located in his lower back and left foot. He rates his pain 0. His current exercise regime is walking daily for 5 minutes daily. Also states the Nucynta is not relieving his pain, we will increase the Nucynta to 100 mg per Dr. Letta Pate recommendations, he verbalizes understanding. Instructed to complete his prescriptions of Nucynta 75 mg, he verbalizes understanding.   S/P Permanent Spinal Stimulator Placed on 09/10/15 by Dr. Maryjean Ka, he's receiving moderate relief of pain he states.   Pain Inventory Average Pain 8 Pain Right Now 0 My pain is intermittent, sharp, stabbing and tingling  In the last 24 hours, has pain interfered with the following? General activity 7 Relation with others 7 Enjoyment of life 3 What TIME of day is your pain at its worst? night Sleep (in general) Poor  Pain is worse with: walking, bending, standing and some activites Pain improves with: rest, pacing activities and medication Relief from Meds: ?  Mobility walk with assistance use a cane how many minutes can you walk? 5 ability to climb steps?  yes do you drive?  yes  Function retired I need assistance with the following:  household duties and shopping  Neuro/Psych weakness numbness tremor tingling trouble walking dizziness  Prior Studies Any changes since last visit?  no  Physicians involved in your care Any changes since last visit?  no   Family History  Problem Relation Age of Onset  . Hypertension Mother   . Coronary artery disease Mother   . Heart attack Mother   . Pulmonary fibrosis Father     asbestosis  . Neuropathy Mother    Social History   Social History  . Marital status: Married    Spouse name: [name]  . Number of children: 0  . Years  of education: 20   Occupational History  . engineering     retired   Social History Main Topics  . Smoking status: Never Smoker  . Smokeless tobacco: Never Used  . Alcohol use No  . Drug use: No  . Sexual activity: Not Currently   Other Topics Concern  . None   Social History Narrative   HSG, [name] - BS, [name] - MS-engineering, 2 years on PhD - [name]. Married - '65 - 33yr/divorced; '76- 3 yrs/divorced; '92 . No children. 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 ACP/Living Will - Yes CPR; long-term Mechanical ventilation as long as he was able to cognate; ok for long term artificial nutrition. Precondition being able to cognate and not to have too much pain.    Past Surgical History:  Procedure Laterality Date  . AMPUTATION Left 04/11/2013   Procedure: AMPUTATION DIGIT Left 3rd toe;  Surgeon: [name] MD;  [facility] [facility]  Service: Orthopedics;  Laterality: Left;  Left 3rd toe amputation at [facility]  . CARDIAC CATHETERIZATION  1997   Non-obstructive disease  . CARDIAC CATHETERIZATION  12/24/2012   mild < 20% LCx, prox 30% RCA; LVEF 55-65% , moderate pulmonary HTN, moderate AS  . CHOLECYSTECTOMY    . COLONOSCOPY    . COLONOSCOPY [name] 10/30/2012   Procedure: COLONOSCOPY;  Surgeon: CGatha Mayer MD;  [facility];  Service:  Endoscopy;  Laterality: [name];  . EYE SURGERY Bilateral cataract  . HEMORRHOID SURGERY    . INTRAOCULAR LENS INSERTION Bilateral   . KNEE SURGERY Right   . LEFT AND RIGHT HEART CATHETERIZATION WITH CORONARY ANGIOGRAM [name] 12/24/2012   Procedure: LEFT AND RIGHT HEART CATHETERIZATION WITH CORONARY ANGIOGRAM;  Surgeon: [name], MD;  Location: [facility] CATH LAB;  Service: Cardiovascular;  Laterality: [name];  . LEG SURGERY Bilateral    lenghtening   . SHOULDER ARTHROSCOPY  08/30/2011   Procedure: ARTHROSCOPY SHOULDER;  Surgeon: [name], MD;  Location: [place];  Service: Orthopedics;  Laterality: Right;  Right  Shoulder Arthroscopy, Debridement, and Decompression  . SPINAL CORD STIMULATOR INSERTION [name] 09/10/2015   Procedure: LUMBAR SPINAL CORD STIMULATOR INSERTION;  Surgeon: [name], MD;  Location: [place] NEURO [facility]S;  Service: Neurosurgery;  Laterality: [name];  . 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  R big toe surg due to osteoarth  . TONSILLECTOMY    . traeculectomy Left    eye  . TRANSTHORACIC ECHOCARDIOGRAM  01/2015   No signif change in aortic stenosis (moderate).  Severe LVH w/small LV cavity, EF 60-65%, grade I diast dysfxn.  [name] VITRECTOMY     Past Medical History:  Diagnosis Date  . ASTHMA   . BPH (benign prostatic hypertrophy)   . CAD (coronary artery disease)    Nonobstructive by 12/2012 cath  . CATARACT, HX OF   . COPD   . Essential hypertension, benign   . GERD (gastroesophageal reflux disease)   . Glaucoma   . GOUT   . Hallux valgus (acquired)   . HH (hiatus hernia)   . HYPERCHOLESTEROLEMIA-PURE   . IFG (impaired fasting glucose)    HbA1c jumped from 5.7% to 6.1% 03/2015---started metformin at that time.  . Lumbosacral neuritis   . Lumbosacral spondylosis   . Moderate aortic stenosis    Per 12/2012 echo- (VTI 1.08 cm^2, Vmax 1.11 cm ^2), mild LA dilatation  . Normal memory function 08/2014   Neuropsychological testing ([facility]): no cognitive impairment or sign of neurodegenerative disorder.  Likely has adjustment d/o with mixed anxiety/depressed features and may benefit from low dose SNRI.    [name] Obesity hypoventilation syndrome ([place])   . Obesity, unspecified   . OSA on CPAP    8 cm H2O  . OSTEOARTHRITIS   . Osteoarthrosis, unspecified whether generalized or localized, unspecified site   . Paroxysmal atrial fibrillation ([place]) 2003   Off anticoag for a while due to falls.  Then apixaban started 12/2014.  [name] Peripheral neuropathy ([place])    Heredetary; with chronic neuropathic pain (Dr. Ella Bodo)  . Personal history of colonic adenoma 10/30/2012    Diminutive adenoma, consider repeat 2019 per GI  . PUD (peptic ulcer disease)   . PULMONARY HYPERTENSION, HX OF   . Shortness of breath    with exertion  . Unspecified glaucoma   . Unspecified hereditary and idiopathic peripheral neuropathy approx age 57   bilat LE's, ? left arm, too.  Feet became progressively numb + left foot pain intermittently.  Pt may be trying a spinal stimulator (as of 05/2015)  . VENOUS INSUFFICIENCY   . VENTRAL HERNIA    BP (!) 162/92 (BP Location: Right Wrist, Patient Position: Sitting, Cuff Size: Normal)   Pulse 76   SpO2 96%   Opioid Risk Score:   Fall Risk Score:  `1  Depression screen [name] 2/9  Depression screen [name] 2/9 09/23/2015 07/15/2015 03/16/2015 02/16/2015 09/16/2014  Decreased Interest 0 0 0 0 1  Down, Depressed, Hopeless 0 0 0 0 0  PHQ - 2 Score 0 0 0 0 1  Altered sleeping - - - - 2  Tired, decreased energy - - - - 2  Change in appetite - - - - 1  Feeling bad or failure about yourself  - - - - 0  Trouble concentrating - - - - 0  Moving slowly or fidgety/restless - - - - 0  Suicidal thoughts - - - - 0  PHQ-9 Score - - - - 6  Some recent data might be hidden    Review of Systems  Constitutional: Positive for unexpected weight change.  Eyes: Negative.   Respiratory: Positive for apnea and shortness of breath.   Cardiovascular: Positive for leg swelling.  Gastrointestinal: Positive for constipation.  Endocrine:       High blood sugar  Genitourinary: Negative.   Musculoskeletal: Positive for back pain and gait problem.  Skin: Positive for rash.  Allergic/Immunologic: Negative.   Neurological: Positive for dizziness, tremors, weakness and numbness.       Tingling  Psychiatric/Behavioral: Negative.   All other systems reviewed and are negative.      Objective:   Physical Exam  Constitutional: He is oriented to person, place, and time. He appears well-developed and well-nourished.  HENT:  Head: Normocephalic and atraumatic.  Neck:  Normal range of motion. Neck supple.  Cardiovascular: Normal rate and regular rhythm.   Pulmonary/Chest: Effort normal and breath sounds normal.  Musculoskeletal:  Normal Muscle Bulk and Muscle Testing Reveals: Upper Extremities: Right Decreased ROM 45 Degrees and Muscle Strength 5/5 Left: Full ROM and Muscle Strength 5/5 Lumbar Paraspinal Tenderness L-3-L-4 Mainly Right Side Lower Extremities: Full ROM and Muscle Strength 5/5 Arises from table slowly, using straight cane Narrow Based Gait  Neurological: He is alert and oriented to person, place, and time.  Skin: Skin is warm and dry.  Psychiatric: He has a normal mood and affect.  Nursing note and vitals reviewed.         Assessment & Plan:  1. 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  Refilled: Increased Nucynta 100 mg ---use one every 6 hours as needed for pain.#120. Continue with Gabapentin We will continue the opioid monitoring program, this consists of regular clinic visits, examinations, urine drug screen, pill counts as well as use of [place] Controlled Substance Reporting System. 2. DDD lumbar spine with LBP: Neurology Following. Continue with activity and heat therapy.  S/P Permanent Spinal Stimulator Placed 09/10/15 via Dr. Maryjean Ka.  20 minutes of face to face patient care time was spent during this visit. All questions were encouraged and answered.   F/U in 1 month

## 2016-01-31 ENCOUNTER — Ambulatory Visit: Payer: Medicare Other | Admitting: Family Medicine

## 2016-02-09 ENCOUNTER — Encounter: Payer: Self-pay | Admitting: Family Medicine

## 2016-02-09 ENCOUNTER — Ambulatory Visit (INDEPENDENT_AMBULATORY_CARE_PROVIDER_SITE_OTHER): Payer: Medicare Other | Admitting: Family Medicine

## 2016-02-09 ENCOUNTER — Telehealth: Payer: Self-pay | Admitting: Physical Medicine & Rehabilitation

## 2016-02-09 VITALS — BP 141/72 | HR 75 | Temp 98.2°F | Resp 16 | Ht 72.5 in | Wt 343.5 lb

## 2016-02-09 DIAGNOSIS — F329 Major depressive disorder, single episode, unspecified: Secondary | ICD-10-CM

## 2016-02-09 DIAGNOSIS — R5383 Other fatigue: Secondary | ICD-10-CM

## 2016-02-09 DIAGNOSIS — L03116 Cellulitis of left lower limb: Secondary | ICD-10-CM

## 2016-02-09 DIAGNOSIS — F32A Depression, unspecified: Secondary | ICD-10-CM

## 2016-02-09 NOTE — Progress Notes (Signed)
OFFICE VISIT  02/09/2016   CC:  Chief Complaint  Patient presents with  . Hospitalization Follow-up    HPI:    Patient is a 75 y.o. Caucasian male who presents for f/u hospitalization for left leg cellulitis (from foot up to mid thigh) at [facility].  Reviewed hosp records today. Admitted 7/17, discharged to home 7/19 on clindamycin and finished about 2 wk course. He saw his orthopedist one day after d/c and then again a week later.  He improved very nicely. He says he feels fine now.    Describes a vague feeling of "not feeling right" for about the last month or so.  Says it started before a change in pain med to nucynta.  He had some SOB with dose escalation attempt on nucynta so he went back to oxycodone and tramadol about 6 d/a.  He describes generalized weakness, poor sleep.  Compliant with CPAP.  Recent pulm f/u was reassuring per pt report today, as was cardiology f/u (echo stable).  Pain often inhibits his ability to sleep.  Feels a bit "jangly" or restlessness in body almost like he is on caffeine, as well as slightly worsening UE tremor.  Mild HA a couple of times last week. He does not check his glucoses at home any.  His visual acuity is worse ONLY in R eye. Denies depressed mood but then admits he cried on 3 occasions during arguments with his wife recently, which he has never done, also gets tearful when he thinks about his medical situation and how it is unlikely to improve any.  He admits to feeling like any excess burden.  Denies palpitations.  Some mild cognitive mistakes like forgetting to write down when he took a med (?).   He reports that he monitors his oxygen at periods throughout the day and it is staying above 95%.   Past Medical History:  Diagnosis Date  . ASTHMA   . BPH (benign prostatic hypertrophy)   . CAD (coronary artery disease)    Nonobstructive by 12/2012 cath  . CATARACT, HX OF   . COPD   . Essential hypertension, benign    . GERD (gastroesophageal reflux disease)   . Glaucoma   . GOUT   . Hallux valgus (acquired)   . HH (hiatus hernia)   . HYPERCHOLESTEROLEMIA-PURE   . IFG (impaired fasting glucose)    HbA1c jumped from 5.7% to 6.1% 03/2015---started metformin at that time.  . Lumbosacral neuritis   . Lumbosacral spondylosis   . Moderate aortic stenosis    Per 12/2012 echo- (VTI 1.08 cm^2, Vmax 1.11 cm ^2), mild LA dilatation  . Normal memory function 08/2014   Neuropsychological testing ([facility]): no cognitive impairment or sign of neurodegenerative disorder.  Likely has adjustment d/o with mixed anxiety/depressed features and may benefit from low dose SNRI.    [name] Obesity hypoventilation syndrome ([place])   . Obesity, unspecified   . OSA on CPAP    8 cm H2O  . OSTEOARTHRITIS   . Osteoarthrosis, unspecified whether generalized or localized, unspecified site   . Paroxysmal atrial fibrillation ([place]) 2003   Off anticoag for a while due to falls.  Then apixaban started 12/2014.  [name] Peripheral neuropathy ([place])    Heredetary; with chronic neuropathic pain (Dr. Ella Bodo)  . Personal history of colonic adenoma 10/30/2012   Diminutive adenoma, consider repeat 2019 per GI  . PUD (peptic ulcer disease)   . PULMONARY HYPERTENSION, HX OF   . Shortness  of breath    with exertion  . Unspecified glaucoma   . Unspecified hereditary and idiopathic peripheral neuropathy approx age 54   bilat LE's, ? left arm, too.  Feet became progressively numb + left foot pain intermittently.  Pt may be trying a spinal stimulator (as of 05/2015)  . VENOUS INSUFFICIENCY   . VENTRAL HERNIA     Past Surgical History:  Procedure Laterality Date  . AMPUTATION Left 04/11/2013   Procedure: AMPUTATION DIGIT Left 3rd toe;  Surgeon: [name], MD;  Location: [place];  Service: Orthopedics;  Laterality: Left;  Left 3rd toe amputation at [facility]  . CARDIAC CATHETERIZATION  1997   Non-obstructive disease  . CARDIAC  CATHETERIZATION  12/24/2012   mild < 20% LCx, prox 30% RCA; LVEF 55-65% , moderate pulmonary HTN, moderate AS  . CHOLECYSTECTOMY    . COLONOSCOPY    . COLONOSCOPY [name] 10/30/2012   Procedure: COLONOSCOPY;  Surgeon: [name], MD;  Location: [facility];  Service: Endoscopy;  Laterality: [name];  . EYE SURGERY Bilateral cataract  . HEMORRHOID SURGERY    . INTRAOCULAR LENS INSERTION Bilateral   . KNEE SURGERY Right   . LEFT AND RIGHT HEART CATHETERIZATION WITH CORONARY ANGIOGRAM [name] 12/24/2012   Procedure: LEFT AND RIGHT HEART CATHETERIZATION WITH CORONARY ANGIOGRAM;  Surgeon: [name], MD;  Location: [facility] CATH LAB;  Service: Cardiovascular;  Laterality: [name];  . LEG SURGERY Bilateral    lenghtening   . SHOULDER ARTHROSCOPY  08/30/2011   Procedure: ARTHROSCOPY SHOULDER;  Surgeon: [name], MD;  Location: [place];  Service: Orthopedics;  Laterality: Right;  Right Shoulder Arthroscopy, Debridement, and Decompression  . SPINAL CORD STIMULATOR INSERTION [name] 09/10/2015   Procedure: LUMBAR SPINAL CORD STIMULATOR INSERTION;  Surgeon: [name], MD;  Location: [place] [facility];  Service: Neurosurgery;  Laterality: [name];  . 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  R big toe surg due to osteoarth  . TONSILLECTOMY    . traeculectomy Left    eye  . TRANSTHORACIC ECHOCARDIOGRAM  01/2015   No signif change in aortic stenosis (moderate).  Severe LVH w/small LV cavity, EF 60-65%, grade I diast dysfxn.  [name] VITRECTOMY      Outpatient Medications Prior to Visit  Medication Sig Dispense Refill  . albuterol (PROVENTIL HFA;VENTOLIN HFA) 108 (90 BASE) MCG/ACT inhaler Inhale 2 puffs into the lungs 4 (four) times daily as needed for wheezing or shortness of breath.    . allopurinol (ZYLOPRIM) 300 MG tablet take 1 tablet by mouth once daily with food 30 tablet 11  . apixaban (ELIQUIS) 5 MG TABS tablet Take 1 tablet (5 mg total) by mouth 2 (two) times daily. 180 tablet 1  . B Complex-C (B-COMPLEX WITH  VITAMIN C) tablet Take 1 tablet by mouth daily.    . clotrimazole-betamethasone (LOTRISONE) cream apply to affected area twice a day 45 g 5  . ezetimibe (ZETIA) 10 MG tablet Take 1 tablet (10 mg total) by mouth daily. 90 tablet 0  . finasteride (PROSCAR) 5 MG tablet take 1 tablet by mouth once daily 90 tablet 1  . fluticasone (FLONASE) 50 MCG/ACT nasal spray Place 2 sprays into both nostrils as needed for allergies or rhinitis.    . Fluticasone-Salmeterol (ADVAIR) 250-50 MCG/DOSE AEPB Inhale 1 puff into the lungs daily as needed (for nasal congestion).     . gabapentin (NEURONTIN) 600 MG tablet take 1 tablet by mouth four times a day 120 tablet 3  .  metFORMIN (GLUCOPHAGE) 500 MG tablet take 1 tablet by mouth twice a day with food 60 tablet 6  . metoprolol succinate (TOPROL XL) 25 MG 24 hr tablet Take 1 tablet (25 mg total) by mouth daily. To be taken with 50 mg for total of 75 mg daily 90 tablet 0  . metoprolol succinate (TOPROL-XL) 50 MG 24 hr tablet Take 1 tablet (50 mg total) by mouth daily. To be taken with 25 mg for total of 75 mg daily 30 tablet 6  . Multiple Vitamin (MULTIVITAMIN) capsule Take 1 capsule by mouth daily.    . Niacin CR 1000 MG TBCR Take 1 tablet (1,000 mg total) by mouth 2 (two) times daily. 180 each 3  . omeprazole (PRILOSEC) 10 MG capsule take 1 capsule by mouth once daily 30 capsule 11  . potassium chloride SA (K-DUR,KLOR-CON) 20 MEQ tablet take 1 tablet by mouth once daily 90 tablet 1  . rosuvastatin (CRESTOR) 40 MG tablet Take 1 tablet (40 mg total) by mouth daily. 90 tablet 0  . VITAMIN D, CHOLECALCIFEROL, PO Take 1 tablet by mouth daily.     . Tapentadol HCl 100 MG TABS Take 1 tablet (100 mg total) by mouth every 6 (six) hours as needed for moderate pain. (Patient not taking: Reported on 02/09/2016) 120 tablet 0   No facility-administered medications prior to visit.     Allergies  Allergen Reactions  . Brimonidine Tartrate Shortness Of Breath    Alphagan-Shortness  of breath  . Brinzolamide Shortness Of Breath    AZOPT- Shortness of breath  . Latanoprost Shortness Of Breath    XALATAN- Shortness of breath  . Nucynta [Tapentadol] Shortness Of Breath  . Timolol Maleate Shortness Of Breath and Other (See Comments)    TIMOPTIC- Aggravated asthma  . Diltiazem Swelling     leg swelling  . Rofecoxib Swelling     VIOXX- leg swelling  . Vancomycin Hives and Other (See Comments)    Possible "Red Man Syndrome"? > hives/blisters  . Codeine Other (See Comments)    Childhood reaction  . Celecoxib Other (See Comments)    CELLBREX-confusion  . Colchicine Diarrhea    diarrhea    ROS As per HPI  PE: Blood pressure (!) 141/72, pulse 75, temperature 98.2 F (36.8 C), temperature source Oral, resp. rate 16, height 6' 0.5" (1.842 m), weight (!) 343 lb 8 oz (155.8 kg), SpO2 97 %. Gen: Alert, well appearing.  Patient is oriented to person, place, time, and situation. AFFECT: pleasant, lucid thought and speech. WYO:VZCH: no injection, icteris, swelling, or exudate.  EOMI, PERRLA. Mouth: lips without lesion/swelling.  Oral mucosa pink and moist. Oropharynx without erythema, exudate, or swelling.  CV: RRR, soft systolic murmur.  No rub or gallop. Chest is clear, no wheezing or rales. Normal symmetric air entry throughout both lung fields. No chest wall deformities or tenderness. Neuro: CN 2-12 intact bilaterally, strength 5/5 in proximal and distal upper extremities and lower extremities bilaterally.    No tremor.  FNF normal bilat.  No resting tremor.  No ataxia.  Upper extremity and lower extremity DTRs symmetric.  Visual fields intact bilaterally.   LABS:  Lab Results  Component Value Date   VITAMINB12 680 01/28/2014    Lab Results  Component Value Date   TSH 2.36 01/28/2014   Lab Results  Component Value Date   WBC 7.4 11/23/2015   HGB 13.1 11/23/2015   HCT 39.6 11/23/2015   MCV 89.7 11/23/2015   PLT 168.0  11/23/2015   Lab Results  Component  Value Date   CREATININE 0.66 11/23/2015   BUN 18 11/23/2015   NA 140 11/23/2015   K 4.3 11/23/2015   CL 105 11/23/2015   CO2 27 11/23/2015   Lab Results  Component Value Date   ALT 18 11/23/2015   AST 18 11/23/2015   ALKPHOS 55 11/23/2015   BILITOT 0.6 11/23/2015   Lab Results  Component Value Date   CHOL 120 11/23/2015   Lab Results  Component Value Date   HDL 49.10 11/23/2015   Lab Results  Component Value Date   LDLCALC 51 11/23/2015   Lab Results  Component Value Date   TRIG 100.0 11/23/2015   Lab Results  Component Value Date   CHOLHDL 2 11/23/2015   Lab Results  Component Value Date   PSA 0.23 11/23/2015   PSA 0.33 10/07/2014   PSA 0.40 09/25/2013   Lab Results  Component Value Date   HGBA1C 5.4 12/14/2015    IMPRESSION AND PLAN:  1) Cellulitis, L leg--resolved.  2) Fatigue; with some vague physical symptoms, + some emotional sx's suggestive of depression. Exam unremarkable.  Recent f/u with cardiologist and pulmonologist reassuring per pt report. Will do basic lab w/u and if nothing significant turns up to explain his symptoms then he said he is open to a trial of an antidepressant.  Spent 45 min with pt today, with >50% of this time spent in counseling and care coordination regarding the above problems.  FOLLOW UP: Return in about 4 weeks (around 03/08/2016) for f/u fatigue.  Signed:  Crissie Sickles, MD           02/09/2016

## 2016-02-09 NOTE — Telephone Encounter (Signed)
Patient has stopped taking Nucynta because the change from 75 mg 4 x day to 100 mg 4 x day made it difficult for him to breathe, he ha mild COPD.  He would like to go back to 10 mg of Oxycodone 4 x day.  Please call patient if he can come pick up a prescription.

## 2016-02-09 NOTE — Addendum Note (Signed)
Addended by: Onalee Hua on: 02/09/2016 04:52 PM   Modules accepted: Orders

## 2016-02-09 NOTE — Progress Notes (Signed)
Pre visit review using our clinic review tool, if applicable. No additional management support is needed unless otherwise documented below in the visit note. 

## 2016-02-09 NOTE — Telephone Encounter (Signed)
May change back to Oxycodone 56m QID

## 2016-02-10 ENCOUNTER — Telehealth: Payer: Self-pay | Admitting: *Deleted

## 2016-02-10 MED ORDER — OXYCODONE HCL 10 MG PO TABS: 10.0000 mg | ORAL_TABLET | Freq: Four times a day (QID) | ORAL | 0 refills | Status: DC | PRN

## 2016-02-10 NOTE — Telephone Encounter (Signed)
Tim [name] notified by name identified VM  that his oxycodone Rx is available to pick up, however he will need to bring back the Nucynta for [name] to destroy when he picks up the new Rx.

## 2016-02-10 NOTE — Telephone Encounter (Signed)
Reviewed Dr. Letta Pate recommendation, Oxycodone will be prescribed and Nucynta discontinued. Placed a call to Tim [name], no answer. Left message to return the call.

## 2016-02-10 NOTE — Telephone Encounter (Signed)
error 

## 2016-02-11 LAB — CBC WITH DIFFERENTIAL/PLATELET
Basophils Absolute: 0 cells/uL (ref 0–200)
Basophils Relative: 0 %
EOS PCT: 5 %
Eosinophils Absolute: 430 cells/uL (ref 15–500)
HCT: 40.5 % (ref 38.5–50.0)
HEMOGLOBIN: 13.3 g/dL (ref 13.2–17.1)
LYMPHS ABS: 2236 {cells}/uL (ref 850–3900)
Lymphocytes Relative: 26 %
MCH: 29.5 pg (ref 27.0–33.0)
MCHC: 32.8 g/dL (ref 32.0–36.0)
MCV: 89.8 fL (ref 80.0–100.0)
MONOS PCT: 8 %
MPV: 9.5 fL (ref 7.5–12.5)
Monocytes Absolute: 688 cells/uL (ref 200–950)
NEUTROS ABS: 5246 {cells}/uL (ref 1500–7800)
Neutrophils Relative %: 61 %
PLATELETS: 190 10*3/uL (ref 140–400)
RBC: 4.51 MIL/uL (ref 4.20–5.80)
RDW: 14.8 % (ref 11.0–15.0)
WBC: 8.6 10*3/uL (ref 3.8–10.8)

## 2016-02-11 LAB — HEPATITIS C ANTIBODY: HCV AB: NEGATIVE

## 2016-02-11 LAB — TSH: TSH: 1.67 m[IU]/L (ref 0.40–4.50)

## 2016-02-11 LAB — COMPREHENSIVE METABOLIC PANEL
ALK PHOS: 109 U/L (ref 40–115)
ALT: 124 U/L — AB (ref 9–46)
AST: 91 U/L — AB (ref 10–35)
Albumin: 3.9 g/dL (ref 3.6–5.1)
BUN: 18 mg/dL (ref 7–25)
CALCIUM: 9.2 mg/dL (ref 8.6–10.3)
CO2: 25 mmol/L (ref 20–31)
Chloride: 106 mmol/L (ref 98–110)
Creat: 0.71 mg/dL (ref 0.70–1.18)
GLUCOSE: 97 mg/dL (ref 65–99)
POTASSIUM: 4.8 mmol/L (ref 3.5–5.3)
Sodium: 142 mmol/L (ref 135–146)
Total Bilirubin: 0.5 mg/dL (ref 0.2–1.2)
Total Protein: 6.5 g/dL (ref 6.1–8.1)

## 2016-02-11 LAB — SEDIMENTATION RATE: SED RATE: 20 mm/h (ref 0–20)

## 2016-02-11 LAB — TESTOSTERONE TOTAL,FREE,BIO, MALES
Albumin: 3.9 g/dL (ref 3.6–5.1)
SEX HORMONE BINDING: 75 nmol/L (ref 22–77)
TESTOSTERONE FREE: 6.9 pg/mL — AB (ref 47.0–244.0)
Testosterone, Bioavailable: 12.3 ng/dL — ABNORMAL LOW (ref 130.5–681.7)
Testosterone: 113 ng/dL — ABNORMAL LOW (ref 250–827)

## 2016-02-11 LAB — HEPATITIS B SURFACE ANTIGEN: HEP B S AG: NEGATIVE

## 2016-02-13 ENCOUNTER — Encounter: Payer: Self-pay | Admitting: Family Medicine

## 2016-02-13 ENCOUNTER — Other Ambulatory Visit: Payer: Self-pay | Admitting: Family Medicine

## 2016-02-13 DIAGNOSIS — E291 Testicular hypofunction: Secondary | ICD-10-CM

## 2016-02-15 ENCOUNTER — Other Ambulatory Visit: Payer: Self-pay | Admitting: *Deleted

## 2016-02-15 DIAGNOSIS — E291 Testicular hypofunction: Secondary | ICD-10-CM

## 2016-02-22 ENCOUNTER — Other Ambulatory Visit: Payer: Self-pay | Admitting: Family Medicine

## 2016-02-23 LAB — TESTOSTERONE TOTAL,FREE,BIO, MALES
Albumin: 3.8 g/dL (ref 3.6–5.1)
SEX HORMONE BINDING: 79 nmol/L — AB (ref 22–77)
TESTOSTERONE BIOAVAILABLE: 11 ng/dL — AB (ref 130.5–681.7)
Testosterone, Free: 6.3 pg/mL — ABNORMAL LOW (ref 47.0–244.0)
Testosterone: 108 ng/dL — ABNORMAL LOW (ref 250–827)

## 2016-02-23 LAB — PROLACTIN: PROLACTIN: 15.2 ng/mL (ref 2.0–18.0)

## 2016-02-23 LAB — LUTEINIZING HORMONE: LH: 28.2 m[IU]/mL — AB (ref 1.6–15.2)

## 2016-02-23 LAB — CORTISOL: CORTISOL PLASMA: 14.4 ug/dL

## 2016-02-24 ENCOUNTER — Encounter: Payer: Self-pay | Admitting: Family Medicine

## 2016-02-28 ENCOUNTER — Encounter: Payer: Self-pay | Admitting: Family Medicine

## 2016-02-28 ENCOUNTER — Other Ambulatory Visit: Payer: Self-pay | Admitting: Family Medicine

## 2016-02-28 MED ORDER — TESTOSTERONE CYPIONATE 200 MG/ML IM SOLN: 200.0000 mg | INTRAMUSCULAR | 0 refills | Status: DC

## 2016-03-02 ENCOUNTER — Encounter: Payer: Medicare Other | Attending: Registered Nurse | Admitting: Registered Nurse

## 2016-03-02 ENCOUNTER — Encounter: Payer: Self-pay | Admitting: Registered Nurse

## 2016-03-02 ENCOUNTER — Other Ambulatory Visit: Payer: Self-pay | Admitting: Cardiology

## 2016-03-02 VITALS — BP 128/75 | HR 62 | Resp 17

## 2016-03-02 DIAGNOSIS — J449 Chronic obstructive pulmonary disease, unspecified: Secondary | ICD-10-CM | POA: Insufficient documentation

## 2016-03-02 DIAGNOSIS — M545 Low back pain, unspecified: Secondary | ICD-10-CM

## 2016-03-02 DIAGNOSIS — M1990 Unspecified osteoarthritis, unspecified site: Secondary | ICD-10-CM | POA: Insufficient documentation

## 2016-03-02 DIAGNOSIS — M109 Gout, unspecified: Secondary | ICD-10-CM | POA: Diagnosis not present

## 2016-03-02 DIAGNOSIS — Z79899 Other long term (current) drug therapy: Secondary | ICD-10-CM

## 2016-03-02 DIAGNOSIS — M48061 Spinal stenosis, lumbar region without neurogenic claudication: Secondary | ICD-10-CM

## 2016-03-02 DIAGNOSIS — E7800 Pure hypercholesterolemia, unspecified: Secondary | ICD-10-CM | POA: Diagnosis not present

## 2016-03-02 DIAGNOSIS — G609 Hereditary and idiopathic neuropathy, unspecified: Secondary | ICD-10-CM | POA: Diagnosis not present

## 2016-03-02 DIAGNOSIS — I2510 Atherosclerotic heart disease of native coronary artery without angina pectoris: Secondary | ICD-10-CM | POA: Insufficient documentation

## 2016-03-02 DIAGNOSIS — G8929 Other chronic pain: Secondary | ICD-10-CM

## 2016-03-02 DIAGNOSIS — M4806 Spinal stenosis, lumbar region: Secondary | ICD-10-CM | POA: Diagnosis not present

## 2016-03-02 DIAGNOSIS — I4891 Unspecified atrial fibrillation: Secondary | ICD-10-CM

## 2016-03-02 DIAGNOSIS — G894 Chronic pain syndrome: Secondary | ICD-10-CM

## 2016-03-02 DIAGNOSIS — K219 Gastro-esophageal reflux disease without esophagitis: Secondary | ICD-10-CM | POA: Diagnosis not present

## 2016-03-02 DIAGNOSIS — Z5181 Encounter for therapeutic drug level monitoring: Secondary | ICD-10-CM

## 2016-03-02 DIAGNOSIS — M79672 Pain in left foot: Secondary | ICD-10-CM | POA: Diagnosis present

## 2016-03-02 MED ORDER — OXYCODONE HCL 10 MG PO TABS: 10.0000 mg | ORAL_TABLET | Freq: Four times a day (QID) | ORAL | 0 refills | Status: DC | PRN

## 2016-03-02 NOTE — Addendum Note (Signed)
Addended by: Caro Hight on: 03/02/2016 03:43 PM   Modules accepted: Orders

## 2016-03-02 NOTE — Progress Notes (Signed)
Subjective:    Patient ID: Tim [name], male    DOB: [birth year], 75 y.o.   MRN: [id number]  HPI: Tim [name] is a 75 year old male who returns for follow up for chronic pain and medication refill. He states his pain is located in his lower back and left foot. He rates his pain 2. His current exercise regime is walking daily for 5 minutes daily.  [name] returned the [place], it was destroyed per office policy.  S/P Permanent Spinal Stimulator Placed on 09/10/15 by Dr. Maryjean Ka, he's receiving moderate relief of pain he states.   Pain Inventory Average Pain 8 Pain Right Now 2 My pain is sharp, stabbing and tingling  In the last 24 hours, has pain interfered with the following? General activity 7 Relation with others 7 Enjoyment of life 3 What TIME of day is your pain at its worst? night Sleep (in general) Poor  Pain is worse with: walking, bending and standing Pain improves with: rest, pacing activities, medication, TENS and injections Relief from Meds: 7  Mobility use a cane how many minutes can you walk? 3 ability to climb steps?  yes do you drive?  yes  Function retired I need assistance with the following:  household duties and shopping  Neuro/Psych weakness numbness tremor tingling trouble walking  Prior Studies Any changes since last visit?  no  Physicians involved in your care Any changes since last visit?  no   Family History  Problem Relation Age of Onset  . Hypertension Mother   . Coronary artery disease Mother   . Heart attack Mother   . Neuropathy Mother   . Pulmonary fibrosis Father     asbestosis   Social History   Social History  . Marital status: Married    Spouse name: [name]  . Number of children: 0  . Years of education: 20   Occupational History  . engineering     retired   Social History Main Topics  . Smoking status: Never Smoker  . Smokeless tobacco: Never Used  . Alcohol use No  . Drug use: No  . Sexual  activity: Not Currently   Other Topics Concern  . None   Social History Narrative   HSG, [name] - BS, [name] - MS-engineering, 2 years on PhD - [name]. Married - '65 - 33yr/divorced; '76- 3 yrs/divorced; '92 . No children. 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 ACP/Living Will - Yes CPR; long-term Mechanical ventilation as long as he was able to cognate; ok for long term artificial nutrition. Precondition being able to cognate and not to have too much pain.    Past Surgical History:  Procedure Laterality Date  . AMPUTATION Left 04/11/2013   Procedure: AMPUTATION DIGIT Left 3rd toe;  Surgeon: [name] MD;  [place]  Service: Orthopedics;  Laterality: Left;  Left 3rd toe amputation at [facility]  . CARDIAC CATHETERIZATION  1997   Non-obstructive disease  . CARDIAC CATHETERIZATION  12/24/2012   mild < 20% LCx, prox 30% RCA; LVEF 55-65% , moderate pulmonary HTN, moderate AS  . CHOLECYSTECTOMY    . COLONOSCOPY    . COLONOSCOPY [name] 10/30/2012   Procedure: COLONOSCOPY;  Surgeon: CGatha Mayer MD;  Location: [facility];  Service: Endoscopy;  Laterality: [name];  . EYE SURGERY Bilateral cataract  . HEMORRHOID SURGERY    . INTRAOCULAR LENS INSERTION Bilateral   . KNEE SURGERY Right   .  LEFT AND RIGHT HEART CATHETERIZATION WITH CORONARY ANGIOGRAM [name] 12/24/2012   Procedure: LEFT AND RIGHT HEART CATHETERIZATION WITH CORONARY ANGIOGRAM;  Surgeon: [name], MD;  [facility];  Service: Cardiovascular;  Laterality: [name];  . LEG SURGERY Bilateral    lenghtening   . SHOULDER ARTHROSCOPY  08/30/2011   Procedure: ARTHROSCOPY SHOULDER;  Surgeon: [name], MD;  Location: [name];  Service: Orthopedics;  Laterality: Right;  Right Shoulder Arthroscopy, Debridement, and Decompression  . SPINAL CORD STIMULATOR INSERTION [name] 09/10/2015   Procedure: LUMBAR SPINAL CORD STIMULATOR INSERTION;  Surgeon: [name], MD;  Location: [place] [facility];  Service:  Neurosurgery;  Laterality: [name];  . 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  R big toe surg due to osteoarth  . TONSILLECTOMY    . traeculectomy Left    eye  . TRANSTHORACIC ECHOCARDIOGRAM  01/2015; 01/2016   No signif change in aortic stenosis (moderate).  Severe LVH w/small LV cavity, EF 60-65%, grade I diast dysfxn.  Tim [name] VITRECTOMY     Past Medical History:  Diagnosis Date  . ASTHMA   . BPH (benign prostatic hypertrophy)   . CAD (coronary artery disease)    Nonobstructive by 12/2012 cath  . CATARACT, HX OF   . COPD   . Elevated transaminase level 02/2016   Suspect due to fatty liver (documented on u/s 2007). Plan repeat labs 03/2016.  Tim [name] Essential hypertension, benign   . Fatty liver 2007   u/s showed fatty liver with hepatosplenomegaly  . GERD (gastroesophageal reflux disease)   . Glaucoma   . GOUT   . Hallux valgus (acquired)   . HH (hiatus hernia)   . HYPERCHOLESTEROLEMIA-PURE   . Hypogonadism male   . IFG (impaired fasting glucose)    HbA1c jumped from 5.7% to 6.1% 03/2015---started metformin at that time.  . Lumbosacral neuritis   . Lumbosacral spondylosis   . Moderate aortic stenosis    Per 12/2012 echo- (VTI 1.08 cm^2, Vmax 1.11 cm ^2), mild LA dilatation  . Normal memory function 08/2014   Neuropsychological testing ([facility]): no cognitive impairment or sign of neurodegenerative disorder.  Likely has adjustment d/o with mixed anxiety/depressed features and may benefit from low dose SNRI.    Tim [name] Obesity hypoventilation syndrome ([place])   . Obesity, unspecified   . OSA on CPAP    8 cm H2O  . OSTEOARTHRITIS   . Osteoarthrosis, unspecified whether generalized or localized, unspecified site   . Paroxysmal atrial fibrillation ([name]) 2003   Off anticoag for a while due to falls.  Then apixaban started 12/2014.  Tim [name] Peripheral neuropathy ([place])    Heredetary; with chronic neuropathic pain (Dr. Ella [name])  . Personal history of colonic adenoma 10/30/2012    Diminutive adenoma, consider repeat 2019 per GI  . PUD (peptic ulcer disease)   . PULMONARY HYPERTENSION, HX OF   . Secondary male hypogonadism 2017  . Shortness of breath    with exertion  . Unspecified glaucoma   . Unspecified hereditary and idiopathic peripheral neuropathy approx age 60   bilat LE's, ? left arm, too.  Feet became progressively numb + left foot pain intermittently.  Pt may be trying a spinal stimulator (as of 05/2015)  . VENOUS INSUFFICIENCY   . VENTRAL HERNIA    There were no vitals taken for this visit.  Opioid Risk Score:   Fall Risk Score:  `1  Depression screen [facility] 2/9  Depression screen [facility] 2/9 09/23/2015 07/15/2015 03/16/2015 02/16/2015  09/16/2014  Decreased Interest 0 0 0 0 1  Down, Depressed, Hopeless 0 0 0 0 0  PHQ - 2 Score 0 0 0 0 1  Altered sleeping - - - - 2  Tired, decreased energy - - - - 2  Change in appetite - - - - 1  Feeling bad or failure about yourself  - - - - 0  Trouble concentrating - - - - 0  Moving slowly or fidgety/restless - - - - 0  Suicidal thoughts - - - - 0  PHQ-9 Score - - - - 6  Some recent data might be hidden    Review of Systems  Musculoskeletal: Positive for gait problem.  Neurological: Positive for tremors, weakness and numbness.       Tingling   All other systems reviewed and are negative.      Objective:   Physical Exam  Constitutional: He is oriented to person, place, and time. He appears well-developed and well-nourished.  HENT:  Head: Normocephalic and atraumatic.  Neck: Normal range of motion. Neck supple.  Cardiovascular: Normal rate and regular rhythm.   Pulmonary/Chest: Effort normal and breath sounds normal.  Musculoskeletal:  Normal Muscle Bulk and Muscle Testing Reveals: Upper Extremities: Right: Decreased ROM 45 Degrees and Muscle Strength 5/5  Left: Full ROM and Muscle Strength 5/5 Back without spinal tenderness noted Lower Extremities: Full ROM and Muscle Strength 5/5 Arises  From chair with  ease narrow based gait   Neurological: He is alert and oriented to person, place, and time.  Skin: Skin is warm and dry.  Psychiatric: He has a normal mood and affect.  Nursing note and vitals reviewed.         Assessment & Plan:  1. 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 Continue Gabapentin Refilled: Oxycodone 10 mg one tablet QID#120.  We will continue the opioid monitoring program, this consists of regular clinic visits, examinations, urine drug screen, pill counts as well as use of [place] Controlled Substance Reporting System. 2. DDD lumbar spine with LBP: Neurology Following. Continue with activity and heat therapy.  S/P Permanent Spinal Stimulator Placed 09/10/15 via Dr. Maryjean Ka.  20 minutes of face to face patient care time was spent during this visit. All questions were encouraged and answered.   F/U in 1 month

## 2016-03-03 ENCOUNTER — Ambulatory Visit (INDEPENDENT_AMBULATORY_CARE_PROVIDER_SITE_OTHER): Payer: Medicare Other | Admitting: Family Medicine

## 2016-03-03 DIAGNOSIS — E291 Testicular hypofunction: Secondary | ICD-10-CM

## 2016-03-03 DIAGNOSIS — D649 Anemia, unspecified: Secondary | ICD-10-CM

## 2016-03-03 HISTORY — DX: Anemia, unspecified: D64.9

## 2016-03-03 MED ORDER — TESTOSTERONE CYPIONATE 200 MG/ML IM SOLN
200.0000 mg | INTRAMUSCULAR | Status: DC
  Administered 2016-03-03 – 2016-03-20 (×2): 200 mg via INTRAMUSCULAR

## 2016-03-09 HISTORY — PX: TRANSESOPHAGEAL ECHOCARDIOGRAM: SHX273

## 2016-03-09 HISTORY — PX: OTHER SURGICAL HISTORY: SHX169

## 2016-03-12 LAB — TOXASSURE SELECT,+ANTIDEPR,UR

## 2016-03-14 ENCOUNTER — Telehealth: Payer: Self-pay | Admitting: Physical Medicine & Rehabilitation

## 2016-03-14 MED ORDER — TRAMADOL-ACETAMINOPHEN 37.5-325 MG PO TABS: 1.0000 | ORAL_TABLET | Freq: Four times a day (QID) | ORAL | 0 refills | Status: DC | PRN

## 2016-03-14 NOTE — Telephone Encounter (Signed)
Patient needs a refill on Tramadol.  Please call patient when this is done.

## 2016-03-14 NOTE — Telephone Encounter (Signed)
rx refilled.

## 2016-03-14 NOTE — Progress Notes (Signed)
Urine drug screen for this encounter is consistent for prescribed medications.   

## 2016-03-17 ENCOUNTER — Other Ambulatory Visit: Payer: Self-pay | Admitting: *Deleted

## 2016-03-17 ENCOUNTER — Ambulatory Visit: Payer: Medicare Other

## 2016-03-17 MED ORDER — TRAMADOL-ACETAMINOPHEN 37.5-325 MG PO TABS: 1.0000 | ORAL_TABLET | Freq: Four times a day (QID) | ORAL | 1 refills | Status: DC | PRN

## 2016-03-20 ENCOUNTER — Encounter: Payer: Self-pay | Admitting: Family Medicine

## 2016-03-20 ENCOUNTER — Ambulatory Visit (INDEPENDENT_AMBULATORY_CARE_PROVIDER_SITE_OTHER): Payer: Medicare Other | Admitting: Family Medicine

## 2016-03-20 VITALS — BP 136/81 | HR 74 | Temp 98.0°F | Resp 18 | Wt 317.0 lb

## 2016-03-20 DIAGNOSIS — I214 Non-ST elevation (NSTEMI) myocardial infarction: Secondary | ICD-10-CM

## 2016-03-20 DIAGNOSIS — R7301 Impaired fasting glucose: Secondary | ICD-10-CM | POA: Diagnosis not present

## 2016-03-20 DIAGNOSIS — E291 Testicular hypofunction: Secondary | ICD-10-CM | POA: Diagnosis not present

## 2016-03-20 DIAGNOSIS — I2510 Atherosclerotic heart disease of native coronary artery without angina pectoris: Secondary | ICD-10-CM

## 2016-03-20 DIAGNOSIS — R0609 Other forms of dyspnea: Secondary | ICD-10-CM

## 2016-03-20 DIAGNOSIS — I480 Paroxysmal atrial fibrillation: Secondary | ICD-10-CM

## 2016-03-20 DIAGNOSIS — R0600 Dyspnea, unspecified: Secondary | ICD-10-CM

## 2016-03-20 DIAGNOSIS — Z79899 Other long term (current) drug therapy: Secondary | ICD-10-CM | POA: Diagnosis not present

## 2016-03-20 DIAGNOSIS — I350 Nonrheumatic aortic (valve) stenosis: Secondary | ICD-10-CM

## 2016-03-20 DIAGNOSIS — R7401 Elevation of levels of liver transaminase levels: Secondary | ICD-10-CM

## 2016-03-20 DIAGNOSIS — R740 Nonspecific elevation of levels of transaminase and lactic acid dehydrogenase [LDH]: Secondary | ICD-10-CM

## 2016-03-20 HISTORY — DX: Non-ST elevation (NSTEMI) myocardial infarction: I21.4

## 2016-03-20 NOTE — Progress Notes (Signed)
Pre visit review using our clinic review tool, if applicable. No additional management support is needed unless otherwise documented below in the visit note. 

## 2016-03-20 NOTE — Progress Notes (Signed)
OFFICE VISIT  03/20/2016   CC:  Chief Complaint  Patient presents with  . Hospitalization Follow-up    stent placed   HPI:    Patient is a 75 y.o. Caucasian male who presents for hospital f/u.  I have no records at this time. Discharge date was 03/11/16. Had chest pain, ended up having to get a stent placed. Has plans to go back into hosp [name]) in a couple weeks to get noninvasive aortic valve repair done.  Brylinta was started bid. He remains on eliquis.  He says his chronic DOE is unchanged. No CP since getting stent placed.  He says he will be changing cardiologists to the one of the ones he currently sees via [facility]--this is due to convenience.  Past Medical History:  Diagnosis Date  . ASTHMA   . BPH (benign prostatic hypertrophy)   . CAD (coronary artery disease)    Nonobstructive by 12/2012 cath  . CATARACT, HX OF   . COPD   . Elevated transaminase level 02/2016   Suspect due to fatty liver (documented on u/s 2007). Plan repeat labs 03/2016.  [name] Essential hypertension, benign   . Fatty liver 2007   u/s showed fatty liver with hepatosplenomegaly  . GERD (gastroesophageal reflux disease)   . Glaucoma   . GOUT   . Hallux valgus (acquired)   . HH (hiatus hernia)   . HYPERCHOLESTEROLEMIA-PURE   . Hypogonadism male   . IFG (impaired fasting glucose)    HbA1c jumped from 5.7% to 6.1% 03/2015---started metformin at that time.  . Lumbosacral neuritis   . Lumbosacral spondylosis   . Moderate aortic stenosis    Per 12/2012 echo- (VTI 1.08 cm^2, Vmax 1.11 cm ^2), mild LA dilatation  . Normal memory function 08/2014   Neuropsychological testing ([facility]): no cognitive impairment or sign of neurodegenerative disorder.  Likely has adjustment d/o with mixed anxiety/depressed features and may benefit from low dose SNRI.    [name] Obesity hypoventilation syndrome ([place])   . Obesity, unspecified   . OSA on CPAP    8 cm H2O  . OSTEOARTHRITIS   . Osteoarthrosis,  unspecified whether generalized or localized, unspecified site   . Paroxysmal atrial fibrillation ([place]) 2003   Off anticoag for a while due to falls.  Then apixaban started 12/2014.  [name] Peripheral neuropathy ([place])    Heredetary; with chronic neuropathic pain (Dr. Ella Bodo)  . Personal history of colonic adenoma 10/30/2012   Diminutive adenoma, consider repeat 2019 per GI  . PUD (peptic ulcer disease)   . PULMONARY HYPERTENSION, HX OF   . Secondary male hypogonadism 2017  . Shortness of breath    with exertion  . Unspecified glaucoma   . Unspecified hereditary and idiopathic peripheral neuropathy approx age 64   bilat LE's, ? left arm, too.  Feet became progressively numb + left foot pain intermittently.  Pt may be trying a spinal stimulator (as of 05/2015)  . VENOUS INSUFFICIENCY   . VENTRAL HERNIA     Past Surgical History:  Procedure Laterality Date  . AMPUTATION Left 04/11/2013   Procedure: AMPUTATION DIGIT Left 3rd toe;  Surgeon: [name], MD;  Location: [place];  Service: Orthopedics;  Laterality: Left;  Left 3rd toe amputation at [facility]  . CARDIAC CATHETERIZATION  1997   Non-obstructive disease  . CARDIAC CATHETERIZATION  12/24/2012   mild < 20% LCx, prox 30% RCA; LVEF 55-65% , moderate pulmonary HTN, moderate AS  . CHOLECYSTECTOMY    . COLONOSCOPY    .  COLONOSCOPY [name] 10/30/2012   Procedure: COLONOSCOPY;  Surgeon: [name], MD;  [facility];  Service: Endoscopy;  Laterality: [name];  . EYE SURGERY Bilateral cataract  . HEMORRHOID SURGERY    . INTRAOCULAR LENS INSERTION Bilateral   . KNEE SURGERY Right   . LEFT AND RIGHT HEART CATHETERIZATION WITH CORONARY ANGIOGRAM [name] 12/24/2012   Procedure: LEFT AND RIGHT HEART CATHETERIZATION WITH CORONARY ANGIOGRAM;  Surgeon: [name], MD;  Location: [facility] CATH LAB;  Service: Cardiovascular;  Laterality: [name];  . LEG SURGERY Bilateral    lenghtening   . SHOULDER ARTHROSCOPY  08/30/2011   Procedure: ARTHROSCOPY SHOULDER;   Surgeon: [name], MD;  Location: [place];  Service: Orthopedics;  Laterality: Right;  Right Shoulder Arthroscopy, Debridement, and Decompression  . SPINAL CORD STIMULATOR INSERTION [name] 09/10/2015   Procedure: LUMBAR SPINAL CORD STIMULATOR INSERTION;  Surgeon: [name], MD;  Location: [place] NEURO ORS;  Service: Neurosurgery;  Laterality: [name];  . 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  R big toe surg due to osteoarth  . TONSILLECTOMY    . traeculectomy Left    eye  . TRANSTHORACIC ECHOCARDIOGRAM  01/2015; 01/2016   No signif change in aortic stenosis (moderate).  Severe LVH w/small LV cavity, EF 60-65%, grade I diast dysfxn.  [name] VITRECTOMY      Outpatient Medications Prior to Visit  Medication Sig Dispense Refill  . albuterol (PROVENTIL HFA;VENTOLIN HFA) 108 (90 BASE) MCG/ACT inhaler Inhale 2 puffs into the lungs 4 (four) times daily as needed for wheezing or shortness of breath.    . allopurinol (ZYLOPRIM) 300 MG tablet take 1 tablet by mouth once daily with food 30 tablet 11  . apixaban (ELIQUIS) 5 MG TABS tablet Take 1 tablet (5 mg total) by mouth 2 (two) times daily. 180 tablet 1  . B Complex-C (B-COMPLEX WITH VITAMIN C) tablet Take 1 tablet by mouth daily.    . clotrimazole-betamethasone (LOTRISONE) cream apply to affected area twice a day 45 g 5  . ezetimibe (ZETIA) 10 MG tablet Take 1 tablet (10 mg total) by mouth daily. 90 tablet 1  . finasteride (PROSCAR) 5 MG tablet take 1 tablet by mouth once daily 90 tablet 1  . fluticasone (FLONASE) 50 MCG/ACT nasal spray Place 2 sprays into both nostrils as needed for allergies or rhinitis.    . Fluticasone-Salmeterol (ADVAIR) 250-50 MCG/DOSE AEPB Inhale 1 puff into the lungs daily as needed (for nasal congestion).     . gabapentin (NEURONTIN) 600 MG tablet take 1 tablet by mouth four times a day 120 tablet 3  . metFORMIN (GLUCOPHAGE) 500 MG tablet take 1 tablet by mouth twice a day with food 60 tablet 6  . metoprolol succinate  (TOPROL-XL) 25 MG 24 hr tablet Take 1 tablet (25 mg total) by mouth daily. 90 tablet 1  . metoprolol succinate (TOPROL-XL) 50 MG 24 hr tablet Take 1 tablet (50 mg total) by mouth daily. To be taken with 25 mg for total of 75 mg daily 30 tablet 6  . Multiple Vitamin (MULTIVITAMIN) capsule Take 1 capsule by mouth daily.    . Niacin CR 1000 MG TBCR Take 1 tablet (1,000 mg total) by mouth 2 (two) times daily. 180 each 3  . omeprazole (PRILOSEC) 10 MG capsule take 1 capsule by mouth once daily 30 capsule 11  . Oxycodone HCl 10 MG TABS Take 1 tablet (10 mg total) by mouth 4 (four) times daily as needed. 120 tablet  0  . potassium chloride SA (K-DUR,KLOR-CON) 20 MEQ tablet take 1 tablet by mouth once daily 90 tablet 1  . rosuvastatin (CRESTOR) 40 MG tablet Take 1 tablet (40 mg total) by mouth daily. 90 tablet 1  . testosterone cypionate (DEPOTESTOSTERONE CYPIONATE) 200 MG/ML injection Inject 1 mL (200 mg total) into the muscle every 14 (fourteen) days. 10 mL 0  . traMADol-acetaminophen (ULTRACET) 37.5-325 MG tablet Take 1 tablet by mouth 4 (four) times daily as needed. 120 tablet 1  . VITAMIN D, CHOLECALCIFEROL, PO Take 1 tablet by mouth daily.      Facility-Administered Medications Prior to Visit  Medication Dose Route Frequency Provider Last Rate Last Dose  . testosterone cypionate (DEPOTESTOSTERONE CYPIONATE) injection 200 mg  200 mg Intramuscular Q14 Days Tammi Sou, MD   200 mg at 03/20/16 1652    Allergies  Allergen Reactions  . Brimonidine Tartrate Shortness Of Breath    Alphagan-Shortness of breath  . Brimonidine Tartrate Palpitations  . Brinzolamide Shortness Of Breath    AZOPT- Shortness of breath  . Latanoprost Shortness Of Breath    XALATAN- Shortness of breath  . Nucynta [Tapentadol] Shortness Of Breath  . Sulfa Antibiotics Palpitations  . Timolol Maleate Shortness Of Breath and Other (See Comments)    TIMOPTIC- Aggravated asthma  . Cardizem  [Diltiazem Hcl] Swelling  .  Diltiazem Swelling     leg swelling  . Rofecoxib Swelling     VIOXX- leg swelling  . Vancomycin Hives and Other (See Comments)    Possible "Red Man Syndrome"? > hives/blisters  . Codeine Other (See Comments)    Childhood reaction  . Celecoxib Other (See Comments)    CELLBREX-confusion  . Colchicine Diarrhea    diarrhea    ROS As per HPI  PE: Blood pressure 136/81, pulse 74, temperature 98 F (36.7 C), temperature source Oral, resp. rate 18, weight (!) 317 lb (143.8 kg), SpO2 96 %. Gen: Alert, well appearing.  Patient is oriented to person, place, time, and situation. AFFECT: pleasant, lucid thought and speech. CV: RRR, 2/6 systolic murmur heard best at cardiac base.  No r/g.   LUNGS: CTA bilat, nonlabored resps, good aeration in all lung fields.   LABS:    Chemistry      Component Value Date/Time   NA 142 02/09/2016 1333   K 4.8 02/09/2016 1333   CL 106 02/09/2016 1333   CO2 25 02/09/2016 1333   BUN 18 02/09/2016 1333   CREATININE 0.71 02/09/2016 1333      Component Value Date/Time   CALCIUM 9.2 02/09/2016 1333   ALKPHOS 109 02/09/2016 1333   AST 91 (H) 02/09/2016 1333   ALT 124 (H) 02/09/2016 1333   BILITOT 0.5 02/09/2016 1333     Lab Results  Component Value Date   WBC 8.6 02/09/2016   HGB 13.3 02/09/2016   HCT 40.5 02/09/2016   MCV 89.8 02/09/2016   PLT 190 02/09/2016   Lab Results  Component Value Date   HGBA1C 5.4 12/14/2015   Lab Results  Component Value Date   TSH 1.67 02/09/2016    IMPRESSION AND PLAN:  1) CAD; s/p recent hospitalization for CP, got stent placed and was d/c'd home on Brilinta 60m bid. Will obtain records.  Pt doing well since; no CP. Check CBC baseline since he's now on brilinta AND eliquis.  2) Aortic stenosis; hx of moderate severity on last echo 01/2016.  He is set up for noninvasive aortic valve repair per his report  today.  Need to get hosp records.  He'll be changing over to [facility] for convenience.  3)  Chronic DOE: multifactorial.  Hopefully getting aortic valve repair will help this some.  4) Hypogonadism: just started testosterone replacement recently--due for injection today so nurse gave this to him while he was here: 226m IM. Hopefully his chronic fatigue will improve over time on this med.  5) IFG: last A1c 3 mo ago was 5.4%.  Will wait another 3 mo before getting another A1c. Continue metformin.  6) PAF: on eliquis 5102mbid.  No sign of bleeding.  7) Elevated LFTs: 02/09/16.  Viral Hep screen neg.  Repeat hepatic panel today to follow.  An After Visit Summary was printed and given to the patient.  FOLLOW UP: Return in about 3 months (around 06/19/2016) for routine chronic illness f/u (30 min).  Signed:  PhCrissie SicklesMD           03/20/2016

## 2016-03-21 ENCOUNTER — Encounter: Payer: Self-pay | Admitting: Family Medicine

## 2016-03-21 LAB — CBC WITH DIFFERENTIAL/PLATELET
BASOS PCT: 0.6 % (ref 0.0–3.0)
Basophils Absolute: 0 10*3/uL (ref 0.0–0.1)
EOS PCT: 1.3 % (ref 0.0–5.0)
Eosinophils Absolute: 0.1 10*3/uL (ref 0.0–0.7)
HCT: 37.6 % — ABNORMAL LOW (ref 39.0–52.0)
HEMOGLOBIN: 12.3 g/dL — AB (ref 13.0–17.0)
LYMPHS ABS: 1.7 10*3/uL (ref 0.7–4.0)
Lymphocytes Relative: 22.1 % (ref 12.0–46.0)
MCHC: 32.7 g/dL (ref 30.0–36.0)
MCV: 91.4 fl (ref 78.0–100.0)
MONOS PCT: 10.2 % (ref 3.0–12.0)
Monocytes Absolute: 0.8 10*3/uL (ref 0.1–1.0)
Neutro Abs: 5 10*3/uL (ref 1.4–7.7)
Neutrophils Relative %: 65.8 % (ref 43.0–77.0)
Platelets: 268 10*3/uL (ref 150.0–400.0)
RBC: 4.11 Mil/uL — ABNORMAL LOW (ref 4.22–5.81)
RDW: 16.2 % — ABNORMAL HIGH (ref 11.5–15.5)
WBC: 7.6 10*3/uL (ref 4.0–10.5)

## 2016-03-21 LAB — HEPATIC FUNCTION PANEL
ALBUMIN: 3.8 g/dL (ref 3.5–5.2)
ALT: 15 U/L (ref 0–53)
AST: 14 U/L (ref 0–37)
Alkaline Phosphatase: 83 U/L (ref 39–117)
Bilirubin, Direct: 0.2 mg/dL (ref 0.0–0.3)
Total Bilirubin: 0.6 mg/dL (ref 0.2–1.2)
Total Protein: 6.7 g/dL (ref 6.0–8.3)

## 2016-03-22 ENCOUNTER — Encounter: Payer: Self-pay | Admitting: Family Medicine

## 2016-03-22 ENCOUNTER — Other Ambulatory Visit (INDEPENDENT_AMBULATORY_CARE_PROVIDER_SITE_OTHER): Payer: Medicare Other

## 2016-03-22 DIAGNOSIS — D649 Anemia, unspecified: Secondary | ICD-10-CM

## 2016-03-22 LAB — IBC PANEL
Iron: 85 ug/dL (ref 42–165)
SATURATION RATIOS: 31.3 % (ref 20.0–50.0)
Transferrin: 194 mg/dL — ABNORMAL LOW (ref 212.0–360.0)

## 2016-03-23 ENCOUNTER — Encounter: Payer: Self-pay | Admitting: Family Medicine

## 2016-03-24 ENCOUNTER — Ambulatory Visit: Payer: Medicare Other | Admitting: Family Medicine

## 2016-03-27 ENCOUNTER — Telehealth: Payer: Self-pay | Admitting: Family Medicine

## 2016-03-27 NOTE — Telephone Encounter (Signed)
Patient checking to see when he needs his A1C checked. He is not sure if they ran it for his recent hospital admit to [name].

## 2016-03-27 NOTE — Telephone Encounter (Signed)
Appt made for lab.

## 2016-03-29 ENCOUNTER — Telehealth: Payer: Self-pay

## 2016-03-29 MED ORDER — DULOXETINE HCL 30 MG PO CPEP
30.0000 mg | ORAL_CAPSULE | Freq: Every day | ORAL | 1 refills | Status: DC
Start: 2016-03-29 — End: 2016-05-24

## 2016-03-29 NOTE — Telephone Encounter (Signed)
Generic cymbalta eRx'd for pt. F/u depression appt 4-6 wks.-thx

## 2016-03-29 NOTE — Telephone Encounter (Signed)
Patient called inquiring about an antidepressant following his lab results.  He mentioned that it was discussed at last visit and was wondering if something would be called in for him?

## 2016-03-30 ENCOUNTER — Encounter: Payer: Medicare Other | Attending: Registered Nurse | Admitting: Registered Nurse

## 2016-03-30 ENCOUNTER — Encounter: Payer: Self-pay | Admitting: Registered Nurse

## 2016-03-30 VITALS — BP 145/71 | HR 66

## 2016-03-30 DIAGNOSIS — G8929 Other chronic pain: Secondary | ICD-10-CM

## 2016-03-30 DIAGNOSIS — M4806 Spinal stenosis, lumbar region: Secondary | ICD-10-CM | POA: Diagnosis not present

## 2016-03-30 DIAGNOSIS — M109 Gout, unspecified: Secondary | ICD-10-CM | POA: Insufficient documentation

## 2016-03-30 DIAGNOSIS — G894 Chronic pain syndrome: Secondary | ICD-10-CM | POA: Diagnosis not present

## 2016-03-30 DIAGNOSIS — M545 Low back pain, unspecified: Secondary | ICD-10-CM

## 2016-03-30 DIAGNOSIS — E7800 Pure hypercholesterolemia, unspecified: Secondary | ICD-10-CM | POA: Insufficient documentation

## 2016-03-30 DIAGNOSIS — M1990 Unspecified osteoarthritis, unspecified site: Secondary | ICD-10-CM | POA: Diagnosis not present

## 2016-03-30 DIAGNOSIS — K219 Gastro-esophageal reflux disease without esophagitis: Secondary | ICD-10-CM | POA: Insufficient documentation

## 2016-03-30 DIAGNOSIS — Z5181 Encounter for therapeutic drug level monitoring: Secondary | ICD-10-CM

## 2016-03-30 DIAGNOSIS — M48061 Spinal stenosis, lumbar region without neurogenic claudication: Secondary | ICD-10-CM

## 2016-03-30 DIAGNOSIS — J449 Chronic obstructive pulmonary disease, unspecified: Secondary | ICD-10-CM | POA: Diagnosis not present

## 2016-03-30 DIAGNOSIS — Z79899 Other long term (current) drug therapy: Secondary | ICD-10-CM

## 2016-03-30 DIAGNOSIS — G609 Hereditary and idiopathic neuropathy, unspecified: Secondary | ICD-10-CM

## 2016-03-30 DIAGNOSIS — M79672 Pain in left foot: Secondary | ICD-10-CM | POA: Diagnosis present

## 2016-03-30 DIAGNOSIS — I2510 Atherosclerotic heart disease of native coronary artery without angina pectoris: Secondary | ICD-10-CM | POA: Insufficient documentation

## 2016-03-30 MED ORDER — OXYCODONE HCL 10 MG PO TABS: 10.0000 mg | ORAL_TABLET | Freq: Four times a day (QID) | ORAL | 0 refills | Status: DC | PRN

## 2016-03-30 NOTE — Progress Notes (Signed)
Subjective:    Patient ID: Tim [name], male    DOB: 04-28-1941, 75 y.o.   MRN: [id number]  HPI: Tim [name] is a 75year old male who returns for follow up for chronic pain and medication refill. He states his pain is located in his lower back. He rates his pain 5. His current exercise regime is walking daily for 5 minutes daily.   Tim [name] was admitted to [facility] for chest pain from 03/06/2016 and discharge 03/11/2016. He had positive cardiac enzymes. Seen by cardiology and underwent cardiac catheterization. Severe AS and single vessel disease of RCA. He was seen by cardiothoracic surgery for further evaluation Ultimately cardiology performed PCI of RCA with bare metal stent. He was placed on Brilinta. Also states he is schedule for AVR on 04/11/2016.  Pain Inventory Average Pain 6 Pain Right Now 5 My pain is intermittent, sharp, stabbing, tingling and aching  In the last 24 hours, has pain interfered with the following? General activity 7 Relation with others 7 Enjoyment of life 4 What TIME of day is your pain at its worst? night Sleep (in general) Poor  Pain is worse with: walking, bending, standing and some activites Pain improves with: rest, pacing activities, medication, TENS and injections Relief from Meds: 6  Mobility use a cane how many minutes can you walk? 0 ability to climb steps?  no do you drive?  yes Do you have any goals in this area?  no  Function retired I need assistance with the following:  household duties and shopping Do you have any goals in this area?  no  Neuro/Psych weakness numbness tremor tingling depression  Prior Studies Any changes since last visit?  no  Physicians involved in your care Any changes since last visit?  no   Family History  Problem Relation Age of Onset  . Hypertension Mother   . Coronary artery disease Mother   . Heart attack Mother   . Neuropathy Mother   . Pulmonary  fibrosis Father     asbestosis   Social History   Social History  . Marital status: Married    Spouse name: [name]  . Number of children: 0  . Years of education: 20   Occupational History  . engineering     retired   Social History Main Topics  . Smoking status: Never Smoker  . Smokeless tobacco: Never Used  . Alcohol use No  . Drug use: No  . Sexual activity: Not Currently   Other Topics Concern  . Not on file   Social History Narrative   HSG, [name] - BS, [name] - MS-engineering, 2 years on PhD - [employer]. Married - '65 - 11yr/divorced; '76- 3 yrs/divorced; '92 . No children. 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 ACP/Living Will - Yes CPR; long-term Mechanical ventilation as long as he was able to cognate; ok for long term artificial nutrition. Precondition being able to cognate and not to have too much pain.    Past Surgical History:  Procedure Laterality Date  . AMPUTATION Left 04/11/2013   Procedure: AMPUTATION DIGIT Left 3rd toe;  Surgeon: [name] MD;  [place]  Service: Orthopedics;  Laterality: Left;  Left 3rd toe amputation at [facility]  . CARDIAC CATHETERIZATION  1997   Non-obstructive disease  . CARDIAC CATHETERIZATION  12/24/2012   mild < 20% LCx, prox 30% RCA; LVEF 55-65% , moderate pulmonary HTN, moderate AS  .  Carotid dopplers  03/09/2016   [facility]: no hemodynamically significant stenosis on either side.  . CHOLECYSTECTOMY    . COLONOSCOPY    . COLONOSCOPY [name] 10/30/2012   Procedure: COLONOSCOPY;  Surgeon: [name], MD;  [facility];  Service: Endoscopy;  Laterality: [name];  . CORONARY ANGIOPLASTY WITH STENT PLACEMENT  03/2016   [facility]: BMS to RCA-pt was placed on Brilinta.  [name] EYE SURGERY Bilateral cataract  . HEMORRHOID SURGERY    . INTRAOCULAR LENS INSERTION Bilateral   . KNEE SURGERY Right   . LEFT AND RIGHT HEART CATHETERIZATION WITH CORONARY ANGIOGRAM [name] 12/24/2012   Procedure: LEFT AND  RIGHT HEART CATHETERIZATION WITH CORONARY ANGIOGRAM;  Surgeon: [name], MD;  Location: [name] CATH LAB;  Service: Cardiovascular;  Laterality: [name];  . LEG SURGERY Bilateral    lenghtening   . SHOULDER ARTHROSCOPY  08/30/2011   Procedure: ARTHROSCOPY SHOULDER;  Surgeon: [name], MD;  Location: [place];  Service: Orthopedics;  Laterality: Right;  Right Shoulder Arthroscopy, Debridement, and Decompression  . SPINAL CORD STIMULATOR INSERTION [name] 09/10/2015   Procedure: LUMBAR SPINAL CORD STIMULATOR INSERTION;  Surgeon: [name], MD;  Location: [place] NEURO ORS;  Service: Neurosurgery;  Laterality: [name];  . 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  R big toe surg due to osteoarth  . TONSILLECTOMY    . traeculectomy Left    eye  . TRANSESOPHAGEAL ECHOCARDIOGRAM  03/09/2016   [facility]: EF 55-60%, PFO seen with bi-directional shunting, no thrombus in appendage.  . TRANSTHORACIC ECHOCARDIOGRAM  01/2015; 01/2016   No signif change in aortic stenosis (moderate).  Severe LVH w/small LV cavity, EF 60-65%, grade I diast dysfxn.  [name] VITRECTOMY     Past Medical History:  Diagnosis Date  . ASTHMA   . BPH (benign prostatic hypertrophy)   . CAD (coronary artery disease)    Nonobstructive by 12/2012 cath  . CATARACT, HX OF   . COPD   . Elevated transaminase level 02/2016   Suspect due to fatty liver (documented on u/s 2007). Plan repeat labs 03/2016.  [name] Essential hypertension, benign   . Fatty liver 2007   u/s showed fatty liver with hepatosplenomegaly  . GERD (gastroesophageal reflux disease)   . Glaucoma   . GOUT   . Hallux valgus (acquired)   . HH (hiatus hernia)   . HYPERCHOLESTEROLEMIA-PURE   . Hypogonadism male   . IFG (impaired fasting glucose)    HbA1c jumped from 5.7% to 6.1% 03/2015---started metformin at that time.  . Lumbosacral neuritis   . Lumbosacral spondylosis   . Moderate aortic stenosis    Per 12/2012 echo- (VTI 1.08 cm^2, Vmax 1.11 cm ^2), mild LA dilatation  . Normal  memory function 08/2014   Neuropsychological testing ([facility]): no cognitive impairment or sign of neurodegenerative disorder.  Likely has adjustment d/o with mixed anxiety/depressed features and may benefit from low dose SNRI.    [name] Normocytic anemia 03/2016   Mild-pt needs ferritin and vit B12 level checked (as of 03/22/16)  . Obesity hypoventilation syndrome ([place])   . Obesity, unspecified   . OSA on CPAP    8 cm H2O  . OSTEOARTHRITIS   . Osteoarthrosis, unspecified whether generalized or localized, unspecified site   . Paroxysmal atrial fibrillation ([place]) 2003   Off anticoag for a while due to falls.  Then apixaban started 12/2014.  [name] Peripheral neuropathy ([place])    Heredetary; with chronic neuropathic pain (Dr. Ella Bodo)  . Personal history  of colonic adenoma 10/30/2012   Diminutive adenoma, consider repeat 2019 per GI  . PUD (peptic ulcer disease)   . PULMONARY HYPERTENSION, HX OF   . Secondary male hypogonadism 2017  . Severe aortic stenosis    by cath by [facility] 03/2016; CT surgery saw him and plans are in place for TAVR as of 03/20/16.  [name] Shortness of breath    with exertion  . Unspecified glaucoma   . Unspecified hereditary and idiopathic peripheral neuropathy approx age 31   bilat LE's, ? left arm, too.  Feet became progressively numb + left foot pain intermittently.  Pt may be trying a spinal stimulator (as of 05/2015)  . VENOUS INSUFFICIENCY   . VENTRAL HERNIA    There were no vitals taken for this visit.  Opioid Risk Score:   Fall Risk Score:  `1  Depression screen PHQ 2/9  Depression screen [facility] 2/9 09/23/2015 07/15/2015 03/16/2015 02/16/2015 09/16/2014  Decreased Interest 0 0 0 0 1  Down, Depressed, Hopeless 0 0 0 0 0  PHQ - 2 Score 0 0 0 0 1  Altered sleeping - - - - 2  Tired, decreased energy - - - - 2  Change in appetite - - - - 1  Feeling bad or failure about yourself  - - - - 0  Trouble concentrating - - - - 0  Moving slowly or fidgety/restless - -  - - 0  Suicidal thoughts - - - - 0  PHQ-9 Score - - - - 6  Some recent data might be hidden     Review of Systems  Constitutional: Positive for unexpected weight change.  Respiratory: Positive for apnea and shortness of breath.   Musculoskeletal: Positive for gait problem and joint swelling.  Skin: Positive for color change and rash.  Neurological: Positive for tremors, weakness and numbness.       Tingling  Hematological: Bruises/bleeds easily.  All other systems reviewed and are negative.      Objective:   Physical Exam  Constitutional: He is oriented to person, place, and time. He appears well-developed and well-nourished.  HENT:  Head: Normocephalic and atraumatic.  Neck: Normal range of motion. Neck supple.  Cardiovascular: Normal rate and regular rhythm.   Pulmonary/Chest: Effort normal and breath sounds normal.  Musculoskeletal:  Normal Muscle Bulk and Muscle Testing Reveals: Upper Extremities: Left:Full ROM and Muscle Strength 5/5 Right: Decreased ROM 90 Degrees and Muscle Strength 5/5 Back without spinal tenderness Lower Extremities: Full ROM and Muscle Strength 5/5 Arises from table slowly Narrow Based Gait   Neurological: He is alert and oriented to person, place, and time.  Skin: Skin is warm and dry.  Psychiatric: He has a normal mood and affect.  Nursing note and vitals reviewed.         Assessment & Plan:  1. 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 Continue Gabapentin Refilled:Oxycodone 10 mg one tablet QID#120.  We will continue the opioid monitoring program, this consists of regular clinic visits, examinations, urine drug screen, pill counts as well as use of [place] Controlled Substance Reporting System. 2. DDD lumbar spine with LBP: Neurology Following. Continue with activity and heat therapy.  S/P Permanent Spinal Stimulator Placed 09/10/15 via Dr. Maryjean Ka.  20 minutes of  face to face patient care time was spent during this visit. All questions were encouraged and answered.   F/U in 1 month

## 2016-03-30 NOTE — Telephone Encounter (Signed)
Patient notified and will schedule follow up appointment.

## 2016-03-31 ENCOUNTER — Ambulatory Visit: Payer: Medicare Other

## 2016-03-31 ENCOUNTER — Ambulatory Visit (INDEPENDENT_AMBULATORY_CARE_PROVIDER_SITE_OTHER): Payer: Medicare Other

## 2016-03-31 ENCOUNTER — Ambulatory Visit (INDEPENDENT_AMBULATORY_CARE_PROVIDER_SITE_OTHER): Payer: Medicare Other | Admitting: Family Medicine

## 2016-03-31 VITALS — BP 145/80 | HR 85 | Temp 97.5°F | Resp 20

## 2016-03-31 DIAGNOSIS — R0602 Shortness of breath: Secondary | ICD-10-CM | POA: Diagnosis not present

## 2016-03-31 DIAGNOSIS — I2510 Atherosclerotic heart disease of native coronary artery without angina pectoris: Secondary | ICD-10-CM

## 2016-03-31 DIAGNOSIS — Z23 Encounter for immunization: Secondary | ICD-10-CM

## 2016-03-31 DIAGNOSIS — E291 Testicular hypofunction: Secondary | ICD-10-CM

## 2016-03-31 DIAGNOSIS — I2583 Coronary atherosclerosis due to lipid rich plaque: Secondary | ICD-10-CM

## 2016-03-31 MED ORDER — TESTOSTERONE CYPIONATE 200 MG/ML IM SOLN
200.0000 mg | INTRAMUSCULAR | Status: DC
  Administered 2016-03-31: 200 mg via INTRAMUSCULAR

## 2016-03-31 NOTE — Progress Notes (Signed)
Pt came in for testost shot today and nurse noted he was SOB. He says he has felt a significant increase in his chronic SOB since last night.  Denies chest tightness or pain. Denies change in LE's lately.  Past Medical History:  Diagnosis Date  . ASTHMA   . BPH (benign prostatic hypertrophy)   . CAD (coronary artery disease)    Nonobstructive by 12/2012 cath  . CATARACT, HX OF   . COPD   . Elevated transaminase level 02/2016   Suspect due to fatty liver (documented on u/s 2007). Plan repeat labs 03/2016.  [name] Essential hypertension, benign   . Fatty liver 2007   u/s showed fatty liver with hepatosplenomegaly  . GERD (gastroesophageal reflux disease)   . Glaucoma   . GOUT   . Hallux valgus (acquired)   . HH (hiatus hernia)   . HYPERCHOLESTEROLEMIA-PURE   . Hypogonadism male   . IFG (impaired fasting glucose)    HbA1c jumped from 5.7% to 6.1% 03/2015---started metformin at that time.  . Lumbosacral neuritis   . Lumbosacral spondylosis   . Moderate aortic stenosis    Per 12/2012 echo- (VTI 1.08 cm^2, Vmax 1.11 cm ^2), mild LA dilatation  . Normal memory function 08/2014   Neuropsychological testing ([facility]): no cognitive impairment or sign of neurodegenerative disorder.  Likely has adjustment d/o with mixed anxiety/depressed features and may benefit from low dose SNRI.    [name] Normocytic anemia 03/2016   Mild-pt needs ferritin and vit B12 level checked (as of 03/22/16)  . Obesity hypoventilation syndrome ([place])   . Obesity, unspecified   . OSA on CPAP    8 cm H2O  . OSTEOARTHRITIS   . Osteoarthrosis, unspecified whether generalized or localized, unspecified site   . Paroxysmal atrial fibrillation ([place]) 2003   Off anticoag for a while due to falls.  Then apixaban started 12/2014.  [name] Peripheral neuropathy ([place])    Heredetary; with chronic neuropathic pain (Dr. Ella Bodo)  . Personal history of colonic adenoma 10/30/2012   Diminutive adenoma, consider repeat 2019 per GI   . PUD (peptic ulcer disease)   . PULMONARY HYPERTENSION, HX OF   . Secondary male hypogonadism 2017  . Severe aortic stenosis    by cath by [facility] 03/2016; CT surgery saw him and plans are in place for TAVR as of 03/20/16.  [name] Shortness of breath    with exertion  . Unspecified glaucoma   . Unspecified hereditary and idiopathic peripheral neuropathy approx age 64   bilat LE's, ? left arm, too.  Feet became progressively numb + left foot pain intermittently.  Pt may be trying a spinal stimulator (as of 05/2015)  . VENOUS INSUFFICIENCY   . VENTRAL HERNIA    Past Surgical History:  Procedure Laterality Date  . AMPUTATION Left 04/11/2013   Procedure: AMPUTATION DIGIT Left 3rd toe;  Surgeon: [name], MD;  Location: [place];  Service: Orthopedics;  Laterality: Left;  Left 3rd toe amputation at [facility]  . CARDIAC CATHETERIZATION  1997   Non-obstructive disease  . CARDIAC CATHETERIZATION  12/24/2012   mild < 20% LCx, prox 30% RCA; LVEF 55-65% , moderate pulmonary HTN, moderate AS  . Carotid dopplers  03/09/2016   [facility]: no hemodynamically significant stenosis on either side.  . CHOLECYSTECTOMY    . COLONOSCOPY    . COLONOSCOPY [name] 10/30/2012   Procedure: COLONOSCOPY;  Surgeon: [name], MD;  Location: [facility];  Service: Endoscopy;  Laterality: [name];  . CORONARY  ANGIOPLASTY WITH STENT PLACEMENT  03/2016   [facility]: BMS to RCA-pt was placed on Brilinta.  [name] EYE SURGERY Bilateral cataract  . HEMORRHOID SURGERY    . INTRAOCULAR LENS INSERTION Bilateral   . KNEE SURGERY Right   . LEFT AND RIGHT HEART CATHETERIZATION WITH CORONARY ANGIOGRAM [name] 12/24/2012   Procedure: LEFT AND RIGHT HEART CATHETERIZATION WITH CORONARY ANGIOGRAM;  Surgeon: [name], MD;  [facility];  Service: Cardiovascular;  Laterality: [name];  . LEG SURGERY Bilateral    lenghtening   . SHOULDER ARTHROSCOPY  08/30/2011   Procedure: ARTHROSCOPY SHOULDER;  Surgeon: [name], MD;  Location: [place];   Service: Orthopedics;  Laterality: Right;  Right Shoulder Arthroscopy, Debridement, and Decompression  . SPINAL CORD STIMULATOR INSERTION [name] 09/10/2015   Procedure: LUMBAR SPINAL CORD STIMULATOR INSERTION;  Surgeon: [name], MD;  Location: [place] [facility];  Service: Neurosurgery;  Laterality: [name];  . 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  R big toe surg due to osteoarth  . TONSILLECTOMY    . traeculectomy Left    eye  . TRANSESOPHAGEAL ECHOCARDIOGRAM  03/09/2016   [facility]: EF 55-60%, PFO seen with bi-directional shunting, no thrombus in appendage.  . TRANSTHORACIC ECHOCARDIOGRAM  01/2015; 01/2016   No signif change in aortic stenosis (moderate).  Severe LVH w/small LV cavity, EF 60-65%, grade I diast dysfxn.  [name] VITRECTOMY      BP (!) 145/80 (BP Location: Left Arm, Patient Position: Sitting, Cuff Size: Large)   Pulse 85   Temp 97.5 F (36.4 C) (Oral)   Resp 20   SpO2 98%  Gen: alert, oriented x 4, mild resp distress. CV: RRR, no m/r/g.   LUNGS: CTA bilat, nonlabored resps, good aeration in all lung fields.  A/P: Acute on chronic SOB. Pt has CAD with preserved EF, with a stent placed 03/2016. He did not drive himself here today. I recommended he have his wife take him straight to the nearest ED for further evaluation.  An After Visit Summary was printed and given to the patient.  Signed:  Crissie Sickles, MD           03/31/2016  F/u prn.

## 2016-03-31 NOTE — Progress Notes (Signed)
Pre visit review using our clinic review tool, if applicable. No additional management support is needed unless otherwise documented below in the visit note. 

## 2016-04-11 HISTORY — PX: TRANSCATHETER AORTIC VALVE REPLACEMENT, TRANSFEMORAL: SHX6400

## 2016-04-13 HISTORY — PX: PACEMAKER PLACEMENT: SHX43

## 2016-04-25 ENCOUNTER — Other Ambulatory Visit: Payer: Self-pay | Admitting: Family Medicine

## 2016-05-01 ENCOUNTER — Ambulatory Visit (INDEPENDENT_AMBULATORY_CARE_PROVIDER_SITE_OTHER): Payer: Medicare Other | Admitting: Family Medicine

## 2016-05-01 ENCOUNTER — Telehealth: Payer: Self-pay

## 2016-05-01 ENCOUNTER — Encounter: Payer: Self-pay | Admitting: Family Medicine

## 2016-05-01 VITALS — BP 129/70 | HR 64 | Temp 96.9°F | Resp 18 | Wt 338.4 lb

## 2016-05-01 DIAGNOSIS — E876 Hypokalemia: Secondary | ICD-10-CM | POA: Diagnosis not present

## 2016-05-01 DIAGNOSIS — D696 Thrombocytopenia, unspecified: Secondary | ICD-10-CM | POA: Diagnosis not present

## 2016-05-01 DIAGNOSIS — Z950 Presence of cardiac pacemaker: Secondary | ICD-10-CM

## 2016-05-01 DIAGNOSIS — I447 Left bundle-branch block, unspecified: Secondary | ICD-10-CM

## 2016-05-01 DIAGNOSIS — R002 Palpitations: Secondary | ICD-10-CM

## 2016-05-01 DIAGNOSIS — Z953 Presence of xenogenic heart valve: Secondary | ICD-10-CM

## 2016-05-01 DIAGNOSIS — I480 Paroxysmal atrial fibrillation: Secondary | ICD-10-CM

## 2016-05-01 DIAGNOSIS — I440 Atrioventricular block, first degree: Secondary | ICD-10-CM

## 2016-05-01 DIAGNOSIS — I2510 Atherosclerotic heart disease of native coronary artery without angina pectoris: Secondary | ICD-10-CM

## 2016-05-01 DIAGNOSIS — E291 Testicular hypofunction: Secondary | ICD-10-CM

## 2016-05-01 LAB — CBC WITH DIFFERENTIAL/PLATELET
BASOS PCT: 0.5 % (ref 0.0–3.0)
Basophils Absolute: 0 10*3/uL (ref 0.0–0.1)
EOS ABS: 0.3 10*3/uL (ref 0.0–0.7)
EOS PCT: 3.5 % (ref 0.0–5.0)
HEMATOCRIT: 40 % (ref 39.0–52.0)
HEMOGLOBIN: 13.1 g/dL (ref 13.0–17.0)
LYMPHS PCT: 23.2 % (ref 12.0–46.0)
Lymphs Abs: 2.1 10*3/uL (ref 0.7–4.0)
MCHC: 32.7 g/dL (ref 30.0–36.0)
MCV: 90.1 fl (ref 78.0–100.0)
MONO ABS: 0.9 10*3/uL (ref 0.1–1.0)
Monocytes Relative: 9.9 % (ref 3.0–12.0)
NEUTROS ABS: 5.7 10*3/uL (ref 1.4–7.7)
Neutrophils Relative %: 62.9 % (ref 43.0–77.0)
PLATELETS: 214 10*3/uL (ref 150.0–400.0)
RBC: 4.44 Mil/uL (ref 4.22–5.81)
RDW: 16.2 % — AB (ref 11.5–15.5)
WBC: 9.1 10*3/uL (ref 4.0–10.5)

## 2016-05-01 LAB — BASIC METABOLIC PANEL
BUN: 16 mg/dL (ref 6–23)
CALCIUM: 9.5 mg/dL (ref 8.4–10.5)
CO2: 34 mEq/L — ABNORMAL HIGH (ref 19–32)
Chloride: 102 mEq/L (ref 96–112)
Creatinine, Ser: 0.82 mg/dL (ref 0.40–1.50)
GFR: 96.98 mL/min (ref >60.00)
GLUCOSE: 101 mg/dL — AB (ref 70–99)
POTASSIUM: 4.1 meq/L (ref 3.5–5.1)
Sodium: 146 mEq/L — ABNORMAL HIGH (ref 135–145)

## 2016-05-01 MED ORDER — TESTOSTERONE CYPIONATE 200 MG/ML IM SOLN
200.0000 mg | Freq: Once | INTRAMUSCULAR | Status: AC
  Administered 2016-05-01: 200 mg via INTRAMUSCULAR

## 2016-05-01 NOTE — Progress Notes (Signed)
Pre visit review using our clinic review tool, if applicable. No additional management support is needed unless otherwise documented below in the visit note. 

## 2016-05-01 NOTE — Progress Notes (Signed)
OFFICE VISIT  05/02/2016   CC:  Chief Complaint  Patient presents with  . Hospitalization Follow-up    Heart valve replace , pacemaker placed   HPI:    Patient is a 75 y.o. Caucasian male who presents for f/u recent hospitalization/procedures at [name]. About 3 weeks ago he was admitted for TAVR--bioprosthetic.  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   In review of records it appears his post op course was mildly complicated by some decompensated diastolic HF, hypokalemia, and very mild thrombocytopenia.  He is doing some rehab at home guided by self knowledge from past pulm rehab---he says he is physically unable to make it to the rehab place at the hospital.  He declines any HH referral at this time for home health PT.   His surgery has helped his DOE: he finds he can walk further distances now before getting SOB, and it takes shorter time to recover when SOB.  He has only occ palpitations that are short lived and denies sensation of heart racing.  No CP, presyncope, or syncope. He was d/c'd home on ASA and eliquis (continued) but his brilinta was d/c'd. He is eating and drinking well.    Today after he got put in the exam room he began to feel hot and clammy but when I opened the door and let circulation in this went away.  He denies nausea, CP, palpitations, SOB, or tremulousness. He last ate about 5 hours ago--two pieces of toast.  He is due for his q 2 week testosterone injection today.  He says he cannot tell if this med is helping any in the time he has been taking it (a couple of months now).   Past Medical History:  Diagnosis Date  . ASTHMA   . BPH (benign prostatic hypertrophy)   . CAD (coronary artery disease)    Nonobstructive by 12/2012 cath  . CATARACT, HX OF   . COPD   . Elevated transaminase level 02/2016   Suspect due to fatty liver (documented on u/s 2007). Plan  repeat labs 03/2016.  [name] Essential hypertension, benign   . Fatty liver 2007   u/s showed fatty liver with hepatosplenomegaly  . GERD (gastroesophageal reflux disease)   . Glaucoma   . GOUT   . Hallux valgus (acquired)   . HH (hiatus hernia)   . HYPERCHOLESTEROLEMIA-PURE   . Hypogonadism male   . IFG (impaired fasting glucose)    HbA1c jumped from 5.7% to 6.1% 03/2015---started metformin at that time.  . Lumbosacral neuritis   . Lumbosacral spondylosis   . Moderate aortic stenosis    Per 12/2012 echo- (VTI 1.08 cm^2, Vmax 1.11 cm ^2), mild LA dilatation  . Normal memory function 08/2014   Neuropsychological testing ([facility]): no cognitive impairment or sign of neurodegenerative disorder.  Likely has adjustment d/o with mixed anxiety/depressed features and may benefit from low dose SNRI.    [name] Normocytic anemia 03/2016   Mild-pt needs ferritin and vit B12 level checked (as of 03/22/16)  . Obesity hypoventilation syndrome ([place])   . Obesity, unspecified   . OSA on CPAP    8 cm H2O  . OSTEOARTHRITIS   . Osteoarthrosis, unspecified whether generalized or localized, unspecified site   . Paroxysmal atrial fibrillation ([place]) 2003   Off anticoag for a while due to falls.  Then apixaban started 12/2014.  [name] Peripheral neuropathy ([place])  Heredetary; with chronic neuropathic pain (Dr. Ella Bodo)  . Personal history of colonic adenoma 10/30/2012   Diminutive adenoma, consider repeat 2019 per GI  . PUD (peptic ulcer disease)   . PULMONARY HYPERTENSION, HX OF   . Secondary male hypogonadism 2017  . Severe aortic stenosis    by cath by [facility] 03/2016; CT surgery saw him and plans are in place for TAVR as of 03/20/16.  [name] Shortness of breath    with exertion  . Unspecified glaucoma(365.9)   . Unspecified hereditary and idiopathic peripheral neuropathy approx age 62   bilat LE's, ? left arm, too.  Feet became progressively numb + left foot pain intermittently.  Pt may be trying a spinal  stimulator (as of 05/2015)  . VENOUS INSUFFICIENCY   . VENTRAL HERNIA     Past Surgical History:  Procedure Laterality Date  . AMPUTATION Left 04/11/2013   Procedure: AMPUTATION DIGIT Left 3rd toe;  Surgeon: [name], MD;  Location: [place];  Service: Orthopedics;  Laterality: Left;  Left 3rd toe amputation at [facility]  . CARDIAC CATHETERIZATION  1997   Non-obstructive disease  . CARDIAC CATHETERIZATION  12/24/2012   mild < 20% LCx, prox 30% RCA; LVEF 55-65% , moderate pulmonary HTN, moderate AS  . CARDIAC VALVE REPLACEMENT    . Carotid dopplers  03/09/2016   [facility]: no hemodynamically significant stenosis on either side.  . CHOLECYSTECTOMY    . COLONOSCOPY    . COLONOSCOPY [name] 10/30/2012   Procedure: COLONOSCOPY;  Surgeon: [name], MD;  [facility];  Service: Endoscopy;  Laterality: [name];  . CORONARY ANGIOPLASTY WITH STENT PLACEMENT  03/2016   [facility]: BMS to RCA-pt was placed on Brilinta.  [name] EYE SURGERY Bilateral cataract  . HEMORRHOID SURGERY    . INTRAOCULAR LENS INSERTION Bilateral   . KNEE SURGERY Right   . LEFT AND RIGHT HEART CATHETERIZATION WITH CORONARY ANGIOGRAM [name] 12/24/2012   Procedure: LEFT AND RIGHT HEART CATHETERIZATION WITH CORONARY ANGIOGRAM;  Surgeon: [name], MD;  Location: [facility] CATH LAB;  Service: Cardiovascular;  Laterality: [name];  . LEG SURGERY Bilateral    lenghtening   . PACEMAKER PLACEMENT    . SHOULDER ARTHROSCOPY  08/30/2011   Procedure: ARTHROSCOPY SHOULDER;  Surgeon: [name], MD;  Location: [place];  Service: Orthopedics;  Laterality: Right;  Right Shoulder Arthroscopy, Debridement, and Decompression  . SPINAL CORD STIMULATOR INSERTION [name] 09/10/2015   Procedure: LUMBAR SPINAL CORD STIMULATOR INSERTION;  Surgeon: [name], MD;  Location: [place] [facility];  Service: Neurosurgery;  Laterality: [name];  . 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  R big toe surg due to osteoarth  . TONSILLECTOMY    . traeculectomy Left    eye  .  TRANSESOPHAGEAL ECHOCARDIOGRAM  03/09/2016   [facility]: EF 55-60%, PFO seen with bi-directional shunting, no thrombus in appendage.  . TRANSTHORACIC ECHOCARDIOGRAM  01/2015; 01/2016   No signif change in aortic stenosis (moderate).  Severe LVH w/small LV cavity, EF 60-65%, grade I diast dysfxn.  [name] VITRECTOMY      Outpatient Medications Prior to Visit  Medication Sig Dispense Refill  . albuterol (PROVENTIL HFA;VENTOLIN HFA) 108 (90 BASE) MCG/ACT inhaler Inhale 2 puffs into the lungs 4 (four) times daily as needed for wheezing or shortness of breath.    . allopurinol (ZYLOPRIM) 300 MG tablet take 1 tablet by mouth once daily with food 30 tablet 11  . apixaban (ELIQUIS) 5 MG TABS tablet Take 1 tablet (  5 mg total) by mouth 2 (two) times daily. 180 tablet 1  . B Complex-C (B-COMPLEX WITH VITAMIN C) tablet Take 1 tablet by mouth daily.    . clotrimazole-betamethasone (LOTRISONE) cream apply to affected area twice a day 45 g 5  . DULoxetine (CYMBALTA) 30 MG capsule Take 1 capsule (30 mg total) by mouth daily. 30 capsule 1  . ezetimibe (ZETIA) 10 MG tablet Take 1 tablet (10 mg total) by mouth daily. 90 tablet 1  . finasteride (PROSCAR) 5 MG tablet take 1 tablet by mouth once daily 90 tablet 1  . fluticasone (FLONASE) 50 MCG/ACT nasal spray Place 2 sprays into both nostrils as needed for allergies or rhinitis.    . Fluticasone-Salmeterol (ADVAIR) 250-50 MCG/DOSE AEPB Inhale 1 puff into the lungs daily as needed (for nasal congestion).     . gabapentin (NEURONTIN) 600 MG tablet take 1 tablet by mouth four times a day 120 tablet 3  . metFORMIN (GLUCOPHAGE) 500 MG tablet take 1 tablet by mouth twice a day with food 60 tablet 6  . metoprolol succinate (TOPROL-XL) 25 MG 24 hr tablet Take 1 tablet (25 mg total) by mouth daily. 90 tablet 1  . metoprolol succinate (TOPROL-XL) 50 MG 24 hr tablet Take 1 tablet (50 mg total) by mouth daily. To be taken with 25 mg for total of 75 mg daily 30 tablet 6  . Multiple  Vitamin (MULTIVITAMIN) capsule Take 1 capsule by mouth daily.    . Niacin CR 1000 MG TBCR Take 1 tablet (1,000 mg total) by mouth 2 (two) times daily. 180 each 3  . omeprazole (PRILOSEC) 10 MG capsule take 1 capsule by mouth once daily 30 capsule 11  . Oxycodone HCl 10 MG TABS Take 1 tablet (10 mg total) by mouth 4 (four) times daily as needed. 120 tablet 0  . potassium chloride SA (K-DUR,KLOR-CON) 20 MEQ tablet take 1 tablet by mouth once daily 90 tablet 1  . rosuvastatin (CRESTOR) 40 MG tablet Take 1 tablet (40 mg total) by mouth daily. 90 tablet 1  . traMADol-acetaminophen (ULTRACET) 37.5-325 MG tablet Take 1 tablet by mouth 4 (four) times daily as needed. 120 tablet 1  . VITAMIN D, CHOLECALCIFEROL, PO Take 1 tablet by mouth daily.     . niacin (NIASPAN) 1000 MG CR tablet take 1 tablet by mouth twice a day 180 tablet 3  . testosterone cypionate (DEPOTESTOSTERONE CYPIONATE) 200 MG/ML injection Inject 1 mL (200 mg total) into the muscle every 14 (fourteen) days. 10 mL 0  . ticagrelor (BRILINTA) 90 MG TABS tablet Take by mouth.     Facility-Administered Medications Prior to Visit  Medication Dose Route Frequency Provider Last Rate Last Dose  . testosterone cypionate (DEPOTESTOSTERONE CYPIONATE) injection 200 mg  200 mg Intramuscular Q14 Days Tammi Sou, MD   200 mg at 03/31/16 1336  . testosterone cypionate (DEPOTESTOSTERONE CYPIONATE) injection 200 mg  200 mg Intramuscular Q14 Days Tammi Sou, MD   200 mg at 03/20/16 1652    Allergies  Allergen Reactions  . Brimonidine Tartrate Shortness Of Breath    Alphagan-Shortness of breath  . Brimonidine Tartrate Palpitations  . Brinzolamide Shortness Of Breath    AZOPT- Shortness of breath  . Latanoprost Shortness Of Breath    XALATAN- Shortness of breath  . Nucynta [Tapentadol] Shortness Of Breath  . Sulfa Antibiotics Palpitations  . Timolol Maleate Shortness Of Breath and Other (See Comments)    TIMOPTIC- Aggravated asthma  .  Cardizem  [  Diltiazem Hcl] Swelling  . Diltiazem Swelling     leg swelling  . Rofecoxib Swelling     VIOXX- leg swelling  . Vancomycin Hives and Other (See Comments)    Possible "Red Man Syndrome"? > hives/blisters  . Codeine Other (See Comments)    Childhood reaction  . Celecoxib Other (See Comments)    CELLBREX-confusion  . Colchicine Diarrhea    diarrhea    ROS As per HPI  PE: Blood pressure 129/70, pulse 64, temperature (!) 96.9 F (36.1 C), resp. rate 18, weight (!) 338 lb 6.4 oz (153.5 kg), SpO2 97 %. Gen: Alert, well appearing.  Patient is oriented to person, place, time, and situation. AFFECT: pleasant, lucid thought and speech. CV: RRR, distant S1 and S2, occ ectopy can be heard.  I don't apprciate any m/r/g. Chest is clear, no wheezing or rales. Normal symmetric air entry throughout both lung fields. No chest wall deformities or tenderness. ABD: soft, NT/ND EXT: 1+ bilat LE pitting edema (R>L slightly)  LABS:  12 lead EKG: sinus rhythm, 1st deg AV block, LBBB, he has a paced beat when he has a pause.  IMPRESSION AND PLAN:  1) s/p TAVR: last echo in hosp showed prosthetic aortic valve peak and mean gradients normal.  Bioprosthetic valve in aortic position appeared well seated and functioned normally. He has symptomatic improvement in his DOE since getting the valve.  Continue home self-PT.  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     3) PAF; He still feels occ, fleeting palpitations that are clinically inconsequential at this time.  Continue toprol xl at 71m per day total + eliquis.  4) CAD: he is s/p BMS to RAD in recent past.   Brilinta has been d/c'd.  Continue ASA 834mqd, crestor, zetia, toprol xl, niacin. Working on [employer] 5) Hypogonadism: unclear if any benefit from  testost at this time.   Continue this med for now. 200 mg IM given in office today.  6)  Thrombocytopenia: in recent hospitalization his platelets got into the low 100s---follow today with CBC.  7) Hypokalemia: noted in hospitalization: check BMET today.  An After Visit Summary was printed and given to the patient.  FOLLOW UP: Return in about 6 weeks (around 06/12/2016) for routine chronic illness f/u.  Signed:  PhCrissie SicklesMD           05/02/2016

## 2016-05-01 NOTE — Telephone Encounter (Signed)
Patient called on 10/27 requesting a new appointment the appointment he had on 96/8 conflicts with his cardiologist appointment, he recently got a heart valve replaced a week ago and would like to talk with Zella Ball. His appointment has been cancelled and patient has been notified to give [name] a call back to reschdule.

## 2016-05-02 ENCOUNTER — Encounter: Payer: Self-pay | Admitting: Family Medicine

## 2016-05-03 ENCOUNTER — Telehealth: Payer: Self-pay | Admitting: Registered Nurse

## 2016-05-03 ENCOUNTER — Encounter: Payer: Medicare Other | Admitting: Registered Nurse

## 2016-05-03 DIAGNOSIS — I5042 Chronic combined systolic (congestive) and diastolic (congestive) heart failure: Secondary | ICD-10-CM

## 2016-05-03 HISTORY — DX: Chronic combined systolic (congestive) and diastolic (congestive) heart failure: I50.42

## 2016-05-03 NOTE — Telephone Encounter (Signed)
Return Tim [name] call, He was discharged from hospital on 04/17/2016 S/P TAVR and valve replacement.  He picked up his Oxycodone on 04/26/2016, according to [facility]. He will be scheduled with Dr. Letta Pate on 05/22/16 at 1:00 p.m. He verbalizes understanding.

## 2016-05-05 ENCOUNTER — Telehealth: Payer: Self-pay | Admitting: Family Medicine

## 2016-05-05 NOTE — Telephone Encounter (Signed)
Patient scheduled a hospital fu 05/15/16. They did not find anything wrong with him, he had become so weak at home that he fell back in to a chair. Patient states he feels better now. No CB requested. I will request records from [facility].

## 2016-05-06 NOTE — Telephone Encounter (Signed)
Noted  

## 2016-05-15 ENCOUNTER — Ambulatory Visit: Payer: Medicare Other | Admitting: Family Medicine

## 2016-05-18 ENCOUNTER — Encounter: Payer: Self-pay | Admitting: Family Medicine

## 2016-05-19 ENCOUNTER — Telehealth: Payer: Self-pay | Admitting: Family Medicine

## 2016-05-19 NOTE — Telephone Encounter (Signed)
Noted  

## 2016-05-19 NOTE — Telephone Encounter (Signed)
States she went to patient's home per referral from hospital for home health care. Patient denied services due to his problems stemming from back pain not CHL.

## 2016-05-22 ENCOUNTER — Encounter: Payer: Medicare Other | Attending: Registered Nurse

## 2016-05-22 ENCOUNTER — Encounter: Payer: Self-pay | Admitting: Physical Medicine & Rehabilitation

## 2016-05-22 ENCOUNTER — Ambulatory Visit (HOSPITAL_BASED_OUTPATIENT_CLINIC_OR_DEPARTMENT_OTHER): Payer: Medicare Other | Admitting: Physical Medicine & Rehabilitation

## 2016-05-22 VITALS — BP 146/79 | HR 78 | Resp 16

## 2016-05-22 DIAGNOSIS — K219 Gastro-esophageal reflux disease without esophagitis: Secondary | ICD-10-CM | POA: Diagnosis not present

## 2016-05-22 DIAGNOSIS — J449 Chronic obstructive pulmonary disease, unspecified: Secondary | ICD-10-CM | POA: Insufficient documentation

## 2016-05-22 DIAGNOSIS — M48062 Spinal stenosis, lumbar region with neurogenic claudication: Secondary | ICD-10-CM | POA: Diagnosis not present

## 2016-05-22 DIAGNOSIS — G609 Hereditary and idiopathic neuropathy, unspecified: Secondary | ICD-10-CM | POA: Insufficient documentation

## 2016-05-22 DIAGNOSIS — I2510 Atherosclerotic heart disease of native coronary artery without angina pectoris: Secondary | ICD-10-CM | POA: Diagnosis not present

## 2016-05-22 DIAGNOSIS — Z89412 Acquired absence of left great toe: Secondary | ICD-10-CM

## 2016-05-22 DIAGNOSIS — M109 Gout, unspecified: Secondary | ICD-10-CM | POA: Insufficient documentation

## 2016-05-22 DIAGNOSIS — M79672 Pain in left foot: Secondary | ICD-10-CM | POA: Diagnosis present

## 2016-05-22 DIAGNOSIS — M1990 Unspecified osteoarthritis, unspecified site: Secondary | ICD-10-CM | POA: Insufficient documentation

## 2016-05-22 DIAGNOSIS — E7800 Pure hypercholesterolemia, unspecified: Secondary | ICD-10-CM | POA: Insufficient documentation

## 2016-05-22 MED ORDER — TRAMADOL-ACETAMINOPHEN 37.5-325 MG PO TABS: 1.0000 | ORAL_TABLET | Freq: Four times a day (QID) | ORAL | 5 refills | Status: DC | PRN

## 2016-05-22 MED ORDER — OXYCODONE HCL 10 MG PO TABS: 10.0000 mg | ORAL_TABLET | Freq: Four times a day (QID) | ORAL | 0 refills | Status: DC | PRN

## 2016-05-22 NOTE — Progress Notes (Signed)
Subjective:    Patient ID:  Tim [name], male    DOB: [birth year], 75 y.o.   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  During trial had 100% pain relief, 3 d trial Permanent lead not quite as effective  Still on oxycodone 81m QID Gabapentin6020mQID Ultracet 1 po QID   Pain Inventory Average Pain 4 Pain Right Now 1 My pain is intermittent, sharp, dull, stabbing, tingling and aching  In the last 24 hours, has pain interfered with the following? General activity 7 Relation with others 5 Enjoyment of life 3 What TIME of day is your pain at its worst? night Sleep (in general) Fair  Pain is worse with: walking, bending, standing and some activites Pain improves with: rest, pacing activities, medication, TENS and injections Relief from Meds: n/a  Mobility use a cane how many minutes can you walk? 8 ability to climb steps?  yes do you drive?  yes  Function retired  Neuro/Psych weakness numbness tremor tingling trouble walking  Prior Studies Any changes since last visit?  yes  Physicians involved in your care Any changes since last visit?  no   Family History  Problem Relation Age of Onset  . Hypertension Mother   . Coronary artery disease Mother   . Heart attack Mother   . Neuropathy Mother   . Pulmonary fibrosis Father     asbestosis   Social History   Social History  . Marital status: Married    Spouse name: [name]  . Number of children: 0  . Years of education: 20   Occupational History  . engineering     retired   Social History Main Topics  . Smoking status: Never Smoker  . Smokeless tobacco: Never Used  . Alcohol use No  . Drug use: No  . Sexual activity: Not Currently   Other Topics Concern  . None   Social  History Narrative   HSG, [name] - BS, [name] - MS-engineering, 2 years on PhD [place] - '[place] - 8y32yrivorced; '76- 3 yrs/divorced; '92 . No children. 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 ACP/Living Will - Yes CPR; long-term Mechanical ventilation as long as he was able to cognate; ok for long term artificial nutrition. Precondition being able to cognate and not to have too much pain.    Past Surgical History:  Procedure Laterality Date  . AMPUTATION Left 04/11/2013   Procedure: AMPUTATION DIGIT Left 3rd toe;  Surgeon: MarNewt MinionD;  [facility] [place];  Laterality: Left;  Left 3rd toe amputation at [facility]  . CARDIAC CATHETERIZATION  1997; 03/10/16   1997 Non-obstructive disease.  03/2016 BMS to RCA, with 25% pDiag dz, o/w normal cors per cath 03/07/16.  . CMarland KitchenRDIAC CATHETERIZATION  12/24/2012   mild < 20% LCx, prox 30% RCA; LVEF 55-65% , moderate pulmonary HTN, moderate AS  . CARDIAC VALVE REPLACEMENT    . Carotid dopplers  03/09/2016   [facility]: no hemodynamically significant stenosis on either side.  . CHOLECYSTECTOMY    . COLONOSCOPY    . COLONOSCOPY [name] 10/30/2012   Procedure: COLONOSCOPY;  Surgeon: CarGatha MayerD;  [facility];  Service: Endoscopy;  Laterality: [name];  . CORONARY  ANGIOPLASTY WITH STENT PLACEMENT  03/2016   [facility]: BMS to RCA-pt was placed on Brilinta.  [name] EYE SURGERY Bilateral cataract  . HEMORRHOID SURGERY    . INTRAOCULAR LENS INSERTION Bilateral   . KNEE SURGERY Right   . LEFT AND RIGHT HEART CATHETERIZATION WITH CORONARY ANGIOGRAM [name] 12/24/2012   Procedure: LEFT AND RIGHT HEART CATHETERIZATION WITH CORONARY ANGIOGRAM;  Surgeon: [name], MD;  Location: [facility] C[facility] LAB;  Service: Cardiovascular;  Laterality: [name];  . LEG SURGERY Bilateral    lenghtening   . PACEMAKER PLACEMENT    . SHOULDER ARTHROSCOPY  08/30/2011   Procedure: ARTHROSCOPY SHOULDER;  Surgeon: [name], MD;  Location:  [place];  Service: Orthopedics;  Laterality: Right;  Right Shoulder Arthroscopy, Debridement, and Decompression  . SPINAL CORD STIMULATOR INSERTION [name] 09/10/2015   Procedure: LUMBAR SPINAL CORD STIMULATOR INSERTION;  Surgeon: [name], MD;  Location: [place] [facility];  Service: Neurosurgery;  Laterality: [name];  . 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  R big toe surg due to osteoarth  . TONSILLECTOMY    . traeculectomy Left    eye  . TRANSESOPHAGEAL ECHOCARDIOGRAM  03/09/2016   [facility]: EF 55-60%, PFO seen with bi-directional shunting, no thrombus in appendage.  . TRANSTHORACIC ECHOCARDIOGRAM  01/2015; 01/2016   No signif change in aortic stenosis (moderate).  Severe LVH w/small LV cavity, EF 60-65%, grade I diast dysfxn.  [name] VITRECTOMY     Past Medical History:  Diagnosis Date  . ASTHMA   . BPH (benign prostatic hypertrophy)   . CAD (coronary artery disease)    Nonobstructive by 12/2012 cath; then 03/2016 he required BMS to RCA ([facility])  . CATARACT, HX OF   . COPD   . Elevated transaminase level 02/2016   Suspect due to fatty liver (documented on u/s 2007). Plan repeat labs 03/2016.  [name] Essential hypertension, benign   . Fatty liver 2007   u/s showed fatty liver with hepatosplenomegaly  . GERD (gastroesophageal reflux disease)   . Glaucoma   . GOUT   . Hallux valgus (acquired)   . HH (hiatus hernia)   . HYPERCHOLESTEROLEMIA-PURE   . Hypogonadism male   . IFG (impaired fasting glucose)    HbA1c jumped from 5.7% to 6.1% 03/2015---started metformin at that time.  . Lumbosacral neuritis   . Lumbosacral spondylosis   . Normal memory function 08/2014   Neuropsychological testing ([facility]): no cognitive impairment or sign of neurodegenerative disorder.  Likely has adjustment d/o with mixed anxiety/depressed features and may benefit from low dose SNRI.    [name] Normocytic anemia 03/2016   Mild-pt needs ferritin and vit B12 level checked (as of 03/22/16)  . NSTEMI (non-ST  elevated myocardial infarction) ([place]) 03/20/2016   BMS to RCA  . Obesity hypoventilation syndrome ([place])   . Obesity, unspecified   . OSA on CPAP    8 cm H2O  . OSTEOARTHRITIS   . Osteoarthrosis, unspecified whether generalized or localized, unspecified site   . Paroxysmal atrial fibrillation ([place]) 2003   Off anticoag for a while due to falls.  Then apixaban started 12/2014.  [name] Peripheral neuropathy ([place])    [place]; with chronic neuropathic pain (Dr. Ella Bodo)  . Personal history of colonic adenoma 10/30/2012   Diminutive adenoma, consider repeat 2019 per GI  . PUD (peptic ulcer disease)   . PULMONARY HYPERTENSION, HX OF   . Secondary male hypogonadism 2017  . Severe aortic stenosis    by cath by [facility]  03/2016; CT surgery saw him and did TAVR 04/11/16 ([facility])  . Shortness of breath    with exertion  . Unspecified glaucoma(365.9)   . Unspecified hereditary and idiopathic peripheral neuropathy approx age 1   bilat LE's, ? left arm, too.  Feet became progressively numb + left foot pain intermittently.  Pt may be trying a spinal stimulator (as of 05/2015)  . VENOUS INSUFFICIENCY   . VENTRAL HERNIA    BP (!) 146/79   Pulse 78   Resp 16   SpO2 93%   Opioid Risk Score:   Fall Risk Score:  `1  Depression screen PHQ 2/9  Depression screen [facility] 2/9 09/23/2015 07/15/2015 03/16/2015 02/16/2015 09/16/2014  Decreased Interest 0 0 0 0 1  Down, Depressed, Hopeless 0 0 0 0 0  PHQ - 2 Score 0 0 0 0 1  Altered sleeping - - - - 2  Tired, decreased energy - - - - 2  Change in appetite - - - - 1  Feeling bad or failure about yourself  - - - - 0  Trouble concentrating - - - - 0  Moving slowly or fidgety/restless - - - - 0  Suicidal thoughts - - - - 0  PHQ-9 Score - - - - 6  Some recent data might be hidden    Review of Systems  Constitutional: Positive for unexpected weight change.  HENT: Negative.   Eyes: Negative.   Respiratory: Positive for apnea, shortness of breath and wheezing.     Cardiovascular: Negative.   Gastrointestinal: Negative.   Endocrine: Negative.   Genitourinary: Positive for decreased urine volume.  Musculoskeletal:       Limb swelling   Skin: Positive for rash.  Allergic/Immunologic: Negative.   Neurological: Negative.   Hematological: Bruises/bleeds easily.  Psychiatric/Behavioral: Negative.   All other systems reviewed and are negative.      Objective:   Physical Exam  Constitutional: He is oriented to person, place, and time. He appears well-developed.  Obese  HENT:  Head: Normocephalic and atraumatic.  Eyes: Conjunctivae and EOM are normal. Pupils are equal, round, and reactive to light.  Neurological: He is alert and oriented to person, place, and time.  Psychiatric: He has a normal mood and affect.  Nursing note and vitals reviewed. Negative straight leg raising bilaterally. 5/5 strength bilateral hip flexor, knee extensor, ankle dorsiflexor. Gait gait is with a cane. He has increased base of support. His back has limited range of motion 25% flexion, extension, lateral bending and rotation.          Assessment & Plan:  1. Lumbar spinal stenosis, with neurogenic claudication as well as a heredity neuropathy. He responded well to spinal cord stimulator placement Hopefully, he can wean down on his narcotic analgesics, which is one of his goals. Currently he is on 10 mg of oxycodone 4 times per day. Next goal would be to reduce to 3 times per day. Follow-up with nurse practitioner, 1 month

## 2016-05-24 ENCOUNTER — Encounter: Payer: Self-pay | Admitting: Family Medicine

## 2016-05-24 ENCOUNTER — Ambulatory Visit (INDEPENDENT_AMBULATORY_CARE_PROVIDER_SITE_OTHER): Payer: Medicare Other | Admitting: Family Medicine

## 2016-05-24 VITALS — BP 143/83 | HR 89 | Temp 98.4°F | Resp 16 | Wt 334.2 lb

## 2016-05-24 DIAGNOSIS — R7303 Prediabetes: Secondary | ICD-10-CM | POA: Diagnosis not present

## 2016-05-24 DIAGNOSIS — I5042 Chronic combined systolic (congestive) and diastolic (congestive) heart failure: Secondary | ICD-10-CM

## 2016-05-24 DIAGNOSIS — Z953 Presence of xenogenic heart valve: Secondary | ICD-10-CM | POA: Diagnosis not present

## 2016-05-24 DIAGNOSIS — D649 Anemia, unspecified: Secondary | ICD-10-CM | POA: Diagnosis not present

## 2016-05-24 LAB — VITAMIN B12: Vitamin B-12: 429 pg/mL (ref 211–911)

## 2016-05-24 LAB — IRON: Iron: 59 ug/dL (ref 42–165)

## 2016-05-24 LAB — HEMOGLOBIN A1C: Hgb A1c MFr Bld: 5.4 % (ref 4.6–6.5)

## 2016-05-24 LAB — FERRITIN: Ferritin: 44.8 ng/mL (ref 22.0–322.0)

## 2016-05-24 NOTE — Progress Notes (Signed)
OFFICE VISIT  05/24/2016   CC:  Chief Complaint  Patient presents with  . Hospitalization Follow-up   HPI:    Patient is a 75 y.o. Caucasian male who presents for follow up IFG, normocytic anemia, obesity. He has CAD, PAF, and is s/p TAVR and is followed by [facility].  He also has obesity hypoventilation syndrome. His primary limitation over the course of the last year has certainly been his DOE, as well as his chronic pain syndrome.  His DOE improved s/p TAVR about 1 mo ago.  However, it appears he had to be re-hospitalized since that time (no records available yet)---and I have an echo report from 05/18/16 that showed LVEF 50-55%, septal motion c/w conduction abnormality, grade I DD, bioprosthetic AV well seated and functioning normally.  He is being treated medically for mild chronic CHF (demadex 40 mg qd), limiting sodium, trying to lose wt.  A myocardial perfusion scan 05/11/16 showed no ischemia, a scar at apex, with global hypokinesis and EF 36%.  He feels like his DOE is MUCH improved compared to pre TAVR: he estimates he can walk 12 X the amount he could pre-operatively.  He tried the cymbalta 50m qd for at least 6 weeks.   He felt no improvement in his mild/intermittent depressive sx's.    Past Medical History:  Diagnosis Date  . ASTHMA   . BPH (benign prostatic hypertrophy)   . CAD (coronary artery disease)    Nonobstructive by 12/2012 cath; then 03/2016 he required BMS to RCA ([facility])  . CATARACT, HX OF   . COPD   . Elevated transaminase level 02/2016   Suspect due to fatty liver (documented on u/s 2007). Plan repeat labs 03/2016.  .Marland KitchenEssential hypertension, benign   . Fatty liver 2007   u/s showed fatty liver with hepatosplenomegaly  . GERD (gastroesophageal reflux disease)   . Glaucoma   . GOUT   . Hallux valgus (acquired)   . HH (hiatus hernia)   . HYPERCHOLESTEROLEMIA-PURE   . Hypogonadism male   . IFG (impaired fasting glucose)    HbA1c jumped from 5.7%  to 6.1% 03/2015---started metformin at that time.  . Lumbosacral neuritis   . Lumbosacral spondylosis    Lumbar spinal stenosis with neurogenic claudication--contributes to his chronic pain syndrome  . Normal memory function 08/2014   Neuropsychological testing ([facility]): no cognitive impairment or sign of neurodegenerative disorder.  Likely has adjustment d/o with mixed anxiety/depressed features and may benefit from low dose SNRI.    .Marland KitchenNormocytic anemia 03/2016   Mild-pt needs ferritin and vit B12 level checked (as of 03/22/16)  . [place]STEMI (non-ST elevated [place] infarction) [place] 03/20[place]   BMS to RCA  . [place]   . Obesity, unspecified   . OSA on CPAP    8 cm H2O  . OSTEOARTHRITIS   . Osteoarthrosis, unspecified whether generalized or localized, unspecified site   . [place]   Off anticoag for a while due to falls.  Then apixaban started 12/2014.  .Mar[place]Peripheral neuropathy ([place]    [place]; with chronic neuropathic pain (Dr. KElla Bodo  . Personal history of colonic adenoma 10/30/2012   Diminutive adenoma, consider repeat 2019 per GI  . PUD (peptic ulcer disease)   . PULMONARY HYPERTENSION, HX OF   . Secondary male hypogonadism 2017  . 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 Shortness  of breath    with exertion  . Unspecified glaucoma(365.9)   . Unspecified hereditary and idiopathic peripheral neuropathy approx age 51   bilat LE's, ? left arm, too.  Feet became progressively numb + left foot pain intermittently.  Pt may be trying a spinal stimulator (as of 05/2015)  . VENOUS INSUFFICIENCY   . VENTRAL HERNIA     Past Surgical History:  Procedure Laterality Date  . AMPUTATION Left 04/11/2013   Procedure: AMPUTATION DIGIT Left 3rd toe;  Surgeon: [name], MD;  Location: [place];  Service: Orthopedics;  Laterality: Left;  Left 3rd  toe amputation at [facility]  . CARDIAC CATHETERIZATION  1997; 03/10/16   1997 Non-obstructive disease.  03/2016 BMS to RCA, with 25% pDiag dz, o/w normal cors per cath 03/07/16.  [name] CARDIAC CATHETERIZATION  12/24/2012   mild < 20% LCx, prox 30% RCA; LVEF 55-65% , moderate pulmonary HTN, moderate AS  . CARDIAC VALVE REPLACEMENT    . Carotid dopplers  03/09/2016   [facility]: no hemodynamically significant stenosis on either side.  . CHOLECYSTECTOMY    . COLONOSCOPY    . COLONOSCOPY [name] 10/30/2012   Procedure: COLONOSCOPY;  Surgeon: [name], MD;  [facility];  Service: Endoscopy;  Laterality: [name];  . CORONARY ANGIOPLASTY WITH STENT PLACEMENT  03/2016   [facility]: BMS to RCA-pt was placed on Brilinta.  [name] EYE SURGERY Bilateral cataract  . HEMORRHOID SURGERY    . INTRAOCULAR LENS INSERTION Bilateral   . KNEE SURGERY Right   . LEFT AND RIGHT HEART CATHETERIZATION WITH CORONARY ANGIOGRAM [name] 12/24/2012   Procedure: LEFT AND RIGHT HEART CATHETERIZATION WITH CORONARY ANGIOGRAM;  Surgeon: [name], MD;  [facility] LAB;  Service: Cardiovascular;  Laterality: [name];  . LEG SURGERY Bilateral    lenghtening   . PACEMAKER PLACEMENT    . SHOULDER ARTHROSCOPY  08/30/2011   Procedure: ARTHROSCOPY SHOULDER;  Surgeon: [name], MD;  Location: [place];  Service: Orthopedics;  Laterality: Right;  Right Shoulder Arthroscopy, Debridement, and Decompression  . SPINAL CORD STIMULATOR INSERTION [name] 09/10/2015   Procedure: LUMBAR SPINAL CORD STIMULATOR INSERTION;  Surgeon: [name], MD;  Location: [place] [facility];  Service: Neurosurgery;  Laterality: [name];  . 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  R big toe surg due to osteoarth  . TONSILLECTOMY    . traeculectomy Left    eye  . TRANSESOPHAGEAL ECHOCARDIOGRAM  03/09/2016   [facility]: EF 55-60%, PFO seen with bi-directional shunting, no thrombus in appendage.  . TRANSTHORACIC ECHOCARDIOGRAM  01/2015; 01/2016   No signif change in aortic stenosis  (moderate).  Severe LVH w/small LV cavity, EF 60-65%, grade I diast dysfxn.  [name] VITRECTOMY      Outpatient Medications Prior to Visit  Medication Sig Dispense Refill  . albuterol (PROVENTIL HFA;VENTOLIN HFA) 108 (90 BASE) MCG/ACT inhaler Inhale 2 puffs into the lungs 4 (four) times daily as needed for wheezing or shortness of breath.    . allopurinol (ZYLOPRIM) 300 MG tablet take 1 tablet by mouth once daily with food 30 tablet 11  . apixaban (ELIQUIS) 5 MG TABS tablet Take 1 tablet (5 mg total) by mouth 2 (two) times daily. 180 tablet 1  . aspirin EC 81 MG tablet Take by mouth.    . B Complex-C (B-COMPLEX WITH VITAMIN C) tablet Take 1 tablet by mouth daily.    . clotrimazole-betamethasone (LOTRISONE) cream apply to affected area twice a day 45 g 5  .  ezetimibe (ZETIA) 10 MG tablet Take 1 tablet (10 mg total) by mouth daily. 90 tablet 1  . finasteride (PROSCAR) 5 MG tablet take 1 tablet by mouth once daily 90 tablet 1  . fluticasone (FLONASE) 50 MCG/ACT nasal spray Place 2 sprays into both nostrils as needed for allergies or rhinitis.    . Fluticasone-Salmeterol (ADVAIR) 250-50 MCG/DOSE AEPB Inhale 1 puff into the lungs daily as needed (for nasal congestion).     . gabapentin (NEURONTIN) 600 MG tablet take 1 tablet by mouth four times a day 120 tablet 3  . metFORMIN (GLUCOPHAGE) 500 MG tablet take 1 tablet by mouth twice a day with food 60 tablet 6  . metoprolol succinate (TOPROL-XL) 25 MG 24 hr tablet Take 1 tablet (25 mg total) by mouth daily. 90 tablet 1  . metoprolol succinate (TOPROL-XL) 50 MG 24 hr tablet Take 1 tablet (50 mg total) by mouth daily. To be taken with 25 mg for total of 75 mg daily 30 tablet 6  . Multiple Vitamin (MULTIVITAMIN) capsule Take 1 capsule by mouth daily.    . Niacin CR 1000 MG TBCR Take 1 tablet (1,000 mg total) by mouth 2 (two) times daily. 180 each 3  . omeprazole (PRILOSEC) 10 MG capsule take 1 capsule by mouth once daily 30 capsule 11  . Oxycodone HCl 10 MG  TABS Take 1 tablet (10 mg total) by mouth 4 (four) times daily as needed. 120 tablet 0  . potassium chloride SA (K-DUR,KLOR-CON) 20 MEQ tablet take 1 tablet by mouth once daily 90 tablet 1  . rosuvastatin (CRESTOR) 40 MG tablet Take 1 tablet (40 mg total) by mouth daily. 90 tablet 1  . torsemide (DEMADEX) 20 MG tablet Take 40 mg by mouth daily.  0  . traMADol-acetaminophen (ULTRACET) 37.5-325 MG tablet Take 1 tablet by mouth 4 (four) times daily as needed. 120 tablet 5  . VITAMIN D, CHOLECALCIFEROL, PO Take 1 tablet by mouth daily.     . DULoxetine (CYMBALTA) 30 MG capsule Take 1 capsule (30 mg total) by mouth daily. (Patient not taking: Reported on 05/24/2016) 30 capsule 1  . furosemide (LASIX) 40 MG tablet Take 40 mg by mouth daily.  0  . testosterone cypionate (DEPOTESTOSTERONE CYPIONATE) injection 200 mg      No facility-administered medications prior to visit.     Allergies  Allergen Reactions  . Brimonidine Tartrate Shortness Of Breath    Alphagan-Shortness of breath  . Brimonidine Tartrate Palpitations  . Brinzolamide Shortness Of Breath    AZOPT- Shortness of breath  . Latanoprost Shortness Of Breath    XALATAN- Shortness of breath  . Nucynta [Tapentadol] Shortness Of Breath  . Sulfa Antibiotics Palpitations  . Timolol Maleate Shortness Of Breath and Other (See Comments)    TIMOPTIC- Aggravated asthma  . Cardizem  [Diltiazem Hcl] Swelling  . Diltiazem Swelling     leg swelling  . Rofecoxib Swelling     VIOXX- leg swelling  . Vancomycin Hives and Other (See Comments)    Possible "Red Man Syndrome"? > hives/blisters  . Codeine Other (See Comments)    Childhood reaction  . Celecoxib Other (See Comments)    CELLBREX-confusion  . Colchicine Diarrhea    diarrhea  . Tape Rash    ROS As per HPI  PE: Blood pressure (!) 143/83, pulse 89, temperature 98.4 F (36.9 C), temperature source Oral, resp. rate 16, weight (!) 334 lb 4 oz (151.6 kg), SpO2 97 %. Gen: Alert, well  appearing.  Patient is oriented to person, place, time, and situation. AFFECT: pleasant, lucid thought and speech. CV: RRR, distant S1 and S2, no audible m/r/g Chest is clear, no wheezing or rales. Normal symmetric air entry throughout both lung fields. No chest wall deformities or tenderness. EXT: pt had graded compression stockings on so I could not assess LE edema today  LABS:  Lab Results  Component Value Date   VITAMINB12 680 01/28/2014    Lab Results  Component Value Date   HGBA1C 5.4 12/14/2015     Chemistry      Component Value Date/Time   NA 146 (H) 05/01/2016 1209   K 4.1 05/01/2016 1209   CL 102 05/01/2016 1209   CO2 34 (H) 05/01/2016 1209   BUN 16 05/01/2016 1209   CREATININE 0.82 05/01/2016 1209   CREATININE 0.71 02/09/2016 1333      Component Value Date/Time   CALCIUM 9.5 05/01/2016 1209   ALKPHOS 83 03/20/2016 1615   AST 14 03/20/2016 1615   ALT 15 03/20/2016 1615   BILITOT 0.6 03/20/2016 1615     Lab Results  Component Value Date   WBC 9.1 05/01/2016   HGB 13.1 05/01/2016   HCT 40.0 05/01/2016   MCV 90.1 05/01/2016   PLT 214.0 05/01/2016   Lab Results  Component Value Date   CHOL 120 11/23/2015   HDL 49.10 11/23/2015   LDLCALC 51 11/23/2015   TRIG 100.0 11/23/2015   CHOLHDL 2 11/23/2015    IMPRESSION AND PLAN:  1) Mild chronic systolic and diastolic CHF: no sign of fluid overload today. The current medical regimen is effective;  continue present plan and medications. Recent check of lytes/cr 05/14/16 via [facility] were reviewed and were normal.  2) s/p TAVR: recent echo showed this to be well seated and functioning normally, normal gradients. He is followed by cardiologist with [place].  3) Prediabetes: on metformin.  Recheck HbA1c today.  4) Normocytic anemia, mild: could be from frequent blood draws over the last year. Will check ferritin, iron panel, and vit B12 level. Last CBC with [facility] showed Hb 11.5 with normal MCV on  05/14/16.  An After Visit Summary was printed and given to the patient.  FOLLOW UP: Return in about 3 months (around 08/24/2016) for routine chronic illness f/u (30 min).  Signed:  Crissie Sickles, MD           05/24/2016

## 2016-05-24 NOTE — Progress Notes (Signed)
Pre visit review using our clinic review tool, if applicable. No additional management support is needed unless otherwise documented below in the visit note. 

## 2016-05-25 LAB — IRON AND TIBC
%SAT: 16 % (ref 15–60)
Iron: 54 ug/dL (ref 50–180)
TIBC: 334 ug/dL (ref 250–425)
UIBC: 280 ug/dL (ref 125–400)

## 2016-05-29 ENCOUNTER — Other Ambulatory Visit: Payer: Self-pay | Admitting: Registered Nurse

## 2016-06-09 ENCOUNTER — Other Ambulatory Visit: Payer: Self-pay | Admitting: Family Medicine

## 2016-06-12 ENCOUNTER — Other Ambulatory Visit: Payer: Medicare Other

## 2016-06-12 NOTE — Telephone Encounter (Signed)
RF request for metformin LOV: 05/24/16 Next ov: 06/12/16 Last written: 09/30/15 #60 w/ 6RF  Pt reported on 05/04/16 that he was no longer taking the duloxetine.

## 2016-06-19 ENCOUNTER — Other Ambulatory Visit: Payer: Self-pay | Admitting: Family Medicine

## 2016-06-19 NOTE — Telephone Encounter (Signed)
RF request for torsemide LOV: 05/24/16 Next ov: 08/24/16 Last written: unknown

## 2016-06-20 ENCOUNTER — Encounter: Payer: Self-pay | Admitting: Physical Medicine & Rehabilitation

## 2016-06-20 ENCOUNTER — Encounter: Payer: Medicare Other | Attending: Registered Nurse

## 2016-06-20 ENCOUNTER — Ambulatory Visit (HOSPITAL_BASED_OUTPATIENT_CLINIC_OR_DEPARTMENT_OTHER): Payer: Medicare Other | Admitting: Physical Medicine & Rehabilitation

## 2016-06-20 VITALS — BP 140/82 | HR 69

## 2016-06-20 DIAGNOSIS — G5792 Unspecified mononeuropathy of left lower limb: Secondary | ICD-10-CM

## 2016-06-20 DIAGNOSIS — K219 Gastro-esophageal reflux disease without esophagitis: Secondary | ICD-10-CM | POA: Insufficient documentation

## 2016-06-20 DIAGNOSIS — E7800 Pure hypercholesterolemia, unspecified: Secondary | ICD-10-CM | POA: Diagnosis not present

## 2016-06-20 DIAGNOSIS — I2510 Atherosclerotic heart disease of native coronary artery without angina pectoris: Secondary | ICD-10-CM | POA: Insufficient documentation

## 2016-06-20 DIAGNOSIS — J449 Chronic obstructive pulmonary disease, unspecified: Secondary | ICD-10-CM | POA: Diagnosis not present

## 2016-06-20 DIAGNOSIS — M1990 Unspecified osteoarthritis, unspecified site: Secondary | ICD-10-CM | POA: Insufficient documentation

## 2016-06-20 DIAGNOSIS — M109 Gout, unspecified: Secondary | ICD-10-CM | POA: Diagnosis not present

## 2016-06-20 DIAGNOSIS — M48062 Spinal stenosis, lumbar region with neurogenic claudication: Secondary | ICD-10-CM

## 2016-06-20 DIAGNOSIS — G609 Hereditary and idiopathic neuropathy, unspecified: Secondary | ICD-10-CM | POA: Insufficient documentation

## 2016-06-20 DIAGNOSIS — M79672 Pain in left foot: Secondary | ICD-10-CM | POA: Diagnosis present

## 2016-06-20 DIAGNOSIS — Z5181 Encounter for therapeutic drug level monitoring: Secondary | ICD-10-CM

## 2016-06-20 DIAGNOSIS — M792 Neuralgia and neuritis, unspecified: Secondary | ICD-10-CM

## 2016-06-20 DIAGNOSIS — M48061 Spinal stenosis, lumbar region without neurogenic claudication: Secondary | ICD-10-CM | POA: Diagnosis not present

## 2016-06-20 DIAGNOSIS — Z79899 Other long term (current) drug therapy: Secondary | ICD-10-CM

## 2016-06-20 MED ORDER — OXYCODONE HCL 10 MG PO TABS: 10.0000 mg | ORAL_TABLET | Freq: Four times a day (QID) | ORAL | 0 refills | Status: DC

## 2016-06-20 NOTE — Patient Instructions (Signed)
Please try stopping the Ultracet, if he had significant increase in your pain, you may resume. Would drop one dose per day, wait several days and drop another dose if there is no significant increase in pain

## 2016-06-20 NOTE — Progress Notes (Signed)
Subjective:    Patient ID: Tim [name], male    DOB: 02-24-1941, 75 y.o.   MRN: [id number]  HPI Still using SCS, optimizing stimulation patterns, [place] scientific rep is assisting Current medications Oxycodone 10 mg 4 times a day Gabapentin 600 mg 4 times a day Ultracet 1 tablet 4 times a day These are all taken at the same time. About half the days. The patient can skip the 5 AM dose, patient has not tried holding the Ultracet  Patient's back pain is the main limiting factor with mobility during the day. Patient's neuropathic foot pain is keeping him up at night, left foot only  Patient does stationary bicycle every other day for 20 minutes for cardio. Patient alternates bicycle exercise with walking 1400 feet every other day  Uses cane both inside and outside the house. Cooking, limited by standing tolerance, can sit and do meal preparation  Pain Inventory Average Pain 5 Pain Right Now 2 My pain is intermittent, sharp, dull and aching  In the last 24 hours, has pain interfered with the following? General activity 3 Relation with others 3 Enjoyment of life 3 What TIME of day is your pain at its worst? all Sleep (in general) Fair  Pain is worse with: standing Pain improves with: rest and medication Relief from Meds: 6  Mobility walk without assistance use a cane ability to climb steps?  yes do you drive?  yes  Function retired I need assistance with the following:  dressing, bathing, meal prep, household duties and shopping  Neuro/Psych weakness numbness trouble walking  Prior Studies Any changes since last visit?  no  Physicians involved in your care Any changes since last visit?  no   Family History  Problem Relation Age of Onset  . Hypertension Mother   . Coronary artery disease Mother   . Heart attack Mother   . Neuropathy Mother   . Pulmonary fibrosis Father     asbestosis   Social History   Social History  . Marital status: Married      Spouse name: [name]  . Number of children: 0  . Years of education: 20   Occupational History  . engineering     retired   Social History Main Topics  . Smoking status: Never Smoker  . Smokeless tobacco: Never Used  . Alcohol use No  . Drug use: No  . Sexual activity: Not Currently   Other Topics Concern  . Not on file   Social History Narrative   HSG, [name] - BS, [name] - MS-engineering, 2 years on PhD - [employer]. Married - '65 - 8yr/divorced; '76- 3 yrs/divorced; '92 . No children. 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 ACP/Living Will - Yes CPR; long-term Mechanical ventilation as long as he was able to cognate; ok for long term artificial nutrition. Precondition being able to cognate and not to have too much pain.    Past Surgical History:  Procedure Laterality Date  . AMPUTATION Left 04/11/2013   Procedure: AMPUTATION DIGIT Left 3rd toe;  Surgeon: [name] MD;  [place] [facility]  Service: Orthopedics;  Laterality: Left;  Left 3rd toe amputation at [facility]  . CARDIAC CATHETERIZATION  1997; 03/10/16   1997 Non-obstructive disease.  03/2016 BMS to RCA, with 25% pDiag dz, o/w normal cors per cath 03/07/16.  .Marland KitchenCARDIAC CATHETERIZATION  12/24/2012   mild < 20% LCx, prox 30% RCA; LVEF 55-65% , moderate pulmonary HTN, moderate AS  .  CARDIAC VALVE REPLACEMENT    . CARDIOVASCULAR STRESS TEST  05/11/16 ([facility])   Myocardial perfusion imaging:  No ischemia; scar in apex, global hypokinesis, EF 36%.  . Carotid dopplers  03/09/2016   [facility]: no hemodynamically significant stenosis on either side.  . CHOLECYSTECTOMY    . COLONOSCOPY    . COLONOSCOPY [name] 10/30/2012   Procedure: COLONOSCOPY;  Surgeon: [name], MD;  [facility];  Service: Endoscopy;  Laterality: [name];  . CORONARY ANGIOPLASTY WITH STENT PLACEMENT  03/2016   [facility]: BMS to RCA-pt was placed on Brilinta.  [name] EYE SURGERY Bilateral cataract  . HEMORRHOID SURGERY    .  INTRAOCULAR LENS INSERTION Bilateral   . KNEE SURGERY Right   . LEFT AND RIGHT HEART CATHETERIZATION WITH CORONARY ANGIOGRAM [name] 12/24/2012   Procedure: LEFT AND RIGHT HEART CATHETERIZATION WITH CORONARY ANGIOGRAM;  Surgeon: [name], MD;  Location: [facility] LAB;  Service: Cardiovascular;  Laterality: [name];  . LEG SURGERY Bilateral    lenghtening   . PACEMAKER PLACEMENT    . SHOULDER ARTHROSCOPY  08/30/2011   Procedure: ARTHROSCOPY SHOULDER;  Surgeon: [name], MD;  Location: [place];  Service: Orthopedics;  Laterality: Right;  Right Shoulder Arthroscopy, Debridement, and Decompression  . SPINAL CORD STIMULATOR INSERTION [name] 09/10/2015   Procedure: LUMBAR SPINAL CORD STIMULATOR INSERTION;  Surgeon: [name], MD;  Location: [place] [facility];  Service: Neurosurgery;  Laterality: [name];  . 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  R big toe surg due to osteoarth  . TONSILLECTOMY    . traeculectomy Left    eye  . TRANSESOPHAGEAL ECHOCARDIOGRAM  03/09/2016   [facility]: EF 55-60%, PFO seen with bi-directional shunting, no thrombus in appendage.  . TRANSTHORACIC ECHOCARDIOGRAM  01/2015; 01/2016; 05/18/16   01/2015 No signif change in aortic stenosis (moderate).  01/2016 Severe LVH w/small LV cavity, EF 60-65%, grade I diast dysfxn.  05/2016 (s/p TAVR): EF 50-55%, grd I DD, biopros AV good.  [name] VITRECTOMY     Past Medical History:  Diagnosis Date  . ASTHMA   . BPH (benign prostatic hypertrophy)   . CAD (coronary artery disease)    Nonobstructive by 12/2012 cath; then 03/2016 he required BMS to RCA ([facility])  . CATARACT, HX OF   . Chronic combined systolic and diastolic CHF (congestive heart failure) ([place]) 05/2016   Mild; [facility]  . COPD   . Elevated transaminase level 02/2016   Suspect due to fatty liver (documented on u/s 2007). Plan repeat labs 03/2016.  .Marland KitchenEssential hypertension, benign   . Fatty liver 2007   u/s showed fatty liver with  hepatosplenomegaly  . GERD (gastroesophageal reflux disease)   . Glaucoma   . GOUT   . Hallux valgus (acquired)   . HH (hiatus hernia)   . HYPERCHOLESTEROLEMIA-PURE   . Hypogonadism male   . IFG (impaired fasting glucose)    HbA1c jumped from 5.7% to 6.1% 03/2015---started metformin at that time.  . Lumbosacral neuritis   . Lumbosacral spondylosis    Lumbar spinal stenosis with neurogenic claudication--contributes to his chronic pain syndrome  . Normal memory function 08/2014   Neuropsychological testing ([facility]): no cognitive impairment or sign of neurodegenerative disorder.  Likely has adjustment d/o with mixed anxiety/depressed features and may benefit from low dose SNRI.    .Marland KitchenNormocytic anemia 03/2016   Mild-pt needs ferritin and vit B12 level checked (as of 03/22/16)  . NSTEMI (non-ST  elevated myocardial infarction) ([place]) 03/20/2016   BMS to RCA  . Obesity hypoventilation syndrome ([place])   . Obesity, unspecified   . OSA on CPAP    8 cm H2O  . OSTEOARTHRITIS   . Osteoarthrosis, unspecified whether generalized or localized, unspecified site   . Paroxysmal atrial fibrillation ([place]) 2003   Off anticoag for a while due to falls.  Then apixaban started 12/2014.  [name] Peripheral neuropathy ([place])    Heredetary; with chronic neuropathic pain (Dr. Ella Bodo)  . Personal history of colonic adenoma 10/30/2012   Diminutive adenoma, consider repeat 2019 per GI  . PUD (peptic ulcer disease)   . PULMONARY HYPERTENSION, HX OF   . Secondary male hypogonadism 2017  . 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 Shortness of breath    with exertion: much improved s/p TAVR and treatment for CHF.  [name] Unspecified glaucoma(365.9)   . Unspecified hereditary and idiopathic peripheral neuropathy approx age 47   bilat LE's, ? left arm, too.  Feet became progressively numb + left foot pain intermittently.  Pt may be trying a spinal  stimulator (as of 05/2015)  . VENOUS INSUFFICIENCY   . VENTRAL HERNIA    There were no vitals taken for this visit.  Opioid Risk Score:   Fall Risk Score:  `1  Depression screen PHQ 2/9  Depression screen [facility] 2/9 09/23/2015 07/15/2015 03/16/2015 02/16/2015 09/16/2014  Decreased Interest 0 0 0 0 1  Down, Depressed, Hopeless 0 0 0 0 0  PHQ - 2 Score 0 0 0 0 1  Altered sleeping - - - - 2  Tired, decreased energy - - - - 2  Change in appetite - - - - 1  Feeling bad or failure about yourself  - - - - 0  Trouble concentrating - - - - 0  Moving slowly or fidgety/restless - - - - 0  Suicidal thoughts - - - - 0  PHQ-9 Score - - - - 6  Some recent data might be hidden   Review of Systems  HENT: Negative.   Eyes: Negative.   Respiratory: Negative.   Cardiovascular: Negative.   Gastrointestinal: Negative.   Endocrine: Negative.   Genitourinary: Negative.   Musculoskeletal: Positive for back pain and gait problem.  Skin: Negative.   Allergic/Immunologic: Negative.   Neurological: Positive for weakness and numbness.  Hematological: Negative.   Psychiatric/Behavioral: Negative.   All other systems reviewed and are negative.      Objective:   Physical Exam  Constitutional: He is oriented to person, place, and time. He appears well-developed.  obese  HENT:  Head: Normocephalic and atraumatic.  Eyes: Conjunctivae and EOM are normal. Pupils are equal, round, and reactive to light.  Neurological: He is alert and oriented to person, place, and time.  Skin: Skin is warm and dry.  Psychiatric: He has a normal mood and affect.  Nursing note and vitals reviewed.  tenderness around the L4 paraspinal area. Lumbar spine range of motion limited to approximately 50% flexion, 25% extension, 25%, lateral bending Left foot, status post left great toe amputation, sensation to light touch is absent below the knee. Skin over left foot shows no evidence of breakdown. No hypersensitivity to  touch       Assessment & Plan:  1.  Lumbar spinal stenosis  Neuropathic foot pain on the left. Decreased sensation below the knee. Improvement in overall pain after  spinal cord stimulation placement. We will asked patient to wean the Ultracet and monitor pain response. If there is no significant increase in pain, next month may be able to reduce on the number of oxycodone tablets per month. Follow-up with nurse practitioner, 1 month.  2. Neuropathic pain left foot, he also has diabetic neuropathy causing numbness in both feet. Continue gabapentin 600 mg 4 times a day

## 2016-06-28 LAB — TOXASSURE SELECT,+ANTIDEPR,UR

## 2016-06-29 ENCOUNTER — Encounter: Payer: Self-pay | Admitting: Family Medicine

## 2016-07-03 DIAGNOSIS — D696 Thrombocytopenia, unspecified: Secondary | ICD-10-CM

## 2016-07-03 HISTORY — DX: Thrombocytopenia, unspecified: D69.6

## 2016-07-04 NOTE — Progress Notes (Signed)
Urine drug screen for this encounter is consistent for prescribed medication 

## 2016-07-07 ENCOUNTER — Other Ambulatory Visit: Payer: Self-pay | Admitting: Family Medicine

## 2016-07-07 NOTE — Telephone Encounter (Signed)
RF request for finasteride LOV: 05/24/17 Next ov: 08/24/16  Last written: 12/06/15 #90 w/ 1RF

## 2016-07-18 ENCOUNTER — Encounter: Payer: Medicare Other | Attending: Registered Nurse | Admitting: Registered Nurse

## 2016-07-18 ENCOUNTER — Encounter: Payer: Self-pay | Admitting: Registered Nurse

## 2016-07-18 VITALS — BP 125/71 | HR 72 | Resp 14

## 2016-07-18 DIAGNOSIS — E7800 Pure hypercholesterolemia, unspecified: Secondary | ICD-10-CM | POA: Diagnosis not present

## 2016-07-18 DIAGNOSIS — G5792 Unspecified mononeuropathy of left lower limb: Secondary | ICD-10-CM

## 2016-07-18 DIAGNOSIS — M1990 Unspecified osteoarthritis, unspecified site: Secondary | ICD-10-CM | POA: Diagnosis not present

## 2016-07-18 DIAGNOSIS — K219 Gastro-esophageal reflux disease without esophagitis: Secondary | ICD-10-CM | POA: Diagnosis not present

## 2016-07-18 DIAGNOSIS — G894 Chronic pain syndrome: Secondary | ICD-10-CM | POA: Diagnosis not present

## 2016-07-18 DIAGNOSIS — M109 Gout, unspecified: Secondary | ICD-10-CM | POA: Insufficient documentation

## 2016-07-18 DIAGNOSIS — M48062 Spinal stenosis, lumbar region with neurogenic claudication: Secondary | ICD-10-CM

## 2016-07-18 DIAGNOSIS — I2510 Atherosclerotic heart disease of native coronary artery without angina pectoris: Secondary | ICD-10-CM | POA: Diagnosis not present

## 2016-07-18 DIAGNOSIS — Z5181 Encounter for therapeutic drug level monitoring: Secondary | ICD-10-CM

## 2016-07-18 DIAGNOSIS — M792 Neuralgia and neuritis, unspecified: Secondary | ICD-10-CM

## 2016-07-18 DIAGNOSIS — Z79899 Other long term (current) drug therapy: Secondary | ICD-10-CM

## 2016-07-18 DIAGNOSIS — G609 Hereditary and idiopathic neuropathy, unspecified: Secondary | ICD-10-CM | POA: Diagnosis not present

## 2016-07-18 DIAGNOSIS — J449 Chronic obstructive pulmonary disease, unspecified: Secondary | ICD-10-CM | POA: Insufficient documentation

## 2016-07-18 DIAGNOSIS — M79672 Pain in left foot: Secondary | ICD-10-CM | POA: Diagnosis present

## 2016-07-18 MED ORDER — OXYCODONE HCL 10 MG PO TABS: 10.0000 mg | ORAL_TABLET | Freq: Four times a day (QID) | ORAL | 0 refills | Status: DC

## 2016-07-18 NOTE — Progress Notes (Signed)
Subjective:    Patient ID: Tim [name], male    DOB: 06-27-1941, 76 y.o.   MRN: [id number]  HPI: Tim [name] is a 76year old male who returns for follow up appointment for chronic pain and medication refill. He states his pain is located in his lower back and bilateral buttocks. He rates his pain 5. His current exercise regime is walking daily for 5 minutes daily.  Also states on January 2nd, he walking in his bathroom and his right knee gave out and he fell and landed against the bath tub, his wife was able to assist him in getting up. He didn't seek medical attention. Prior to the fall he was able to decrease the Ultracet to two tablets a day, since the fall he had to resume taking the Ultracet 4 times a day. He will resume the titration after his acute pain is relieved he states.   Tim [name] forgot his medication, according to [facility] he picked up his Oxycodone 06/27/16, educated on the narcotic policy he verbalizes understanding.   Pain Inventory Average Pain 3 Pain Right Now 5 My pain is sharp, dull, stabbing and aching  In the last 24 hours, has pain interfered with the following? General activity 8 Relation with others 8 Enjoyment of life 5 What TIME of day is your pain at its worst? night Sleep (in general) Fair  Pain is worse with: walking, bending, standing and some activites Pain improves with: rest, pacing activities, medication, TENS and injections Relief from Meds: 6  Mobility walk with assistance use a cane do you drive?  yes  Function retired I need assistance with the following:  meal prep, household duties and shopping  Neuro/Psych weakness numbness tremor tingling trouble walking  Prior Studies Any changes since last visit?  no  Physicians involved in your care Any changes since last visit?  no   Family History  Problem Relation Age of Onset  . Hypertension Mother   . Coronary artery disease Mother   . Heart attack Mother   .  Neuropathy Mother   . Pulmonary fibrosis Father     asbestosis   Social History   Social History  . Marital status: Married    Spouse name: [name]  . Number of children: 0  . Years of education: 20   Occupational History  . engineering     retired   Social History Main Topics  . Smoking status: Never Smoker  . Smokeless tobacco: Never Used  . Alcohol use No  . Drug use: No  . Sexual activity: Not Currently   Other Topics Concern  . None   Social History Narrative   HSG, [name] - BS, [name] - MS-engineering, 2 years on PhD - [name]. Married - '65 - 71yr/divorced; '76- 3 yrs/divorced; '92 . No children. 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 ACP/Living Will - Yes CPR; long-term Mechanical ventilation as long as he was able to cognate; ok for long term artificial nutrition. Precondition being able to cognate and not to have too much pain.    Past Surgical History:  Procedure Laterality Date  . 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 CARDIAC CATHETERIZATION  1997; 03/10/16   1997 Non-obstructive disease.  03/2016 BMS to RCA, with 25% pDiag dz, o/w normal  cors per cath 03/07/16.  [name] CARDIAC CATHETERIZATION  12/24/2012   mild < 20% LCx, prox 30% RCA; LVEF 55-65% , moderate pulmonary HTN, moderate AS  . CARDIOVASCULAR STRESS TEST  05/11/16 ([facility])   Myocardial perfusion imaging:  No ischemia; scar in apex, global hypokinesis, EF 36%.  . Carotid dopplers  03/09/2016   [facility]: no hemodynamically significant stenosis on either side.  . CHOLECYSTECTOMY    . COLONOSCOPY    . COLONOSCOPY [name] 10/30/2012   Procedure: COLONOSCOPY;  Surgeon: [name], MD;  [facility];  Service: Endoscopy;  Laterality: [name];  . CORONARY ANGIOPLASTY WITH STENT PLACEMENT  03/2016   [facility]: BMS to RCA-pt was  placed on Brilinta.  [name] EYE SURGERY Bilateral cataract  . HEMORRHOID SURGERY    . INTRAOCULAR LENS INSERTION Bilateral   . KNEE SURGERY Right   . LEFT AND RIGHT HEART CATHETERIZATION WITH CORONARY ANGIOGRAM [name] 12/24/2012   Procedure: LEFT AND RIGHT HEART CATHETERIZATION WITH CORONARY ANGIOGRAM;  Surgeon: [name], MD;  [facility];  Service: Cardiovascular;  Laterality: [name];  . LEG SURGERY Bilateral    lenghtening   . PACEMAKER PLACEMENT  04/13/2016   2nd deg HB after TAVR, pt had DC MDT PPM placed.  [name] SHOULDER ARTHROSCOPY  08/30/2011   Procedure: ARTHROSCOPY SHOULDER;  Surgeon: [name], MD;  Location: [place];  Service: Orthopedics;  Laterality: Right;  Right Shoulder Arthroscopy, Debridement, and Decompression  . SPINAL CORD STIMULATOR INSERTION [name] 09/10/2015   Procedure: LUMBAR SPINAL CORD STIMULATOR INSERTION;  Surgeon: [name], MD;  Location: [place] [facility];  Service: Neurosurgery;  Laterality: [name];  . 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  R big toe surg due to osteoarth  . TONSILLECTOMY    . traeculectomy Left    eye  . TRANSCATHETER AORTIC VALVE REPLACEMENT, TRANSFEMORAL  04/11/2016  . TRANSESOPHAGEAL ECHOCARDIOGRAM  03/09/2016   [facility]: EF 55-60%, PFO seen with bi-directional shunting, no thrombus in appendage.  . TRANSTHORACIC ECHOCARDIOGRAM  01/2015; 01/2016; 05/18/16   01/2015 No signif change in aortic stenosis (moderate).  01/2016 Severe LVH w/small LV cavity, EF 60-65%, grade I diast dysfxn.  05/2016 (s/p TAVR): EF 50-55%, grd I DD, biopros AV good.  [name] VITRECTOMY     Past Medical History:  Diagnosis Date  . ASTHMA   . BPH (benign prostatic hypertrophy)   . CAD (coronary artery disease)    Nonobstructive by 12/2012 cath; then 03/2016 he required BMS to RCA ([facility])  . CATARACT, HX OF   . Chronic combined systolic and diastolic CHF (congestive heart failure) ([place]) 05/2016   Mild; [facility]  . COPD   .  Elevated transaminase level 02/2016   Suspect due to fatty liver (documented on u/s 2007). Plan repeat labs 03/2016.  .Marland KitchenEssential hypertension, benign   . Fatty liver 2007   u/s showed fatty liver with hepatosplenomegaly  . GERD (gastroesophageal reflux disease)   . Glaucoma   . GOUT   . Hallux valgus (acquired)   . HH (hiatus hernia)   . HYPERCHOLESTEROLEMIA-PURE   . Hypogonadism male   . IFG (impaired fasting glucose)    HbA1c jumped from 5.7% to 6.1% 03/2015---started metformin at that time.  . Lumbosacral neuritis   . Lumbosacral spondylosis    Lumbar spinal stenosis with neurogenic claudication--contributes to his chronic pain syndrome  . Normal memory function 08/2014   Neuropsychological testing ([facility]): no cognitive impairment or  sign of neurodegenerative disorder.  Likely has adjustment d/o with mixed anxiety/depressed features and may benefit from low dose SNRI.    [name] Normocytic anemia 03/2016   Mild-pt needs ferritin and vit B12 level checked (as of 03/22/16)  . NSTEMI (non-ST elevated myocardial infarction) ([place]) 03/20/2016   BMS to RCA  . Obesity hypoventilation syndrome ([place])   . Obesity, unspecified   . OSA on CPAP    8 cm H2O  . OSTEOARTHRITIS   . Osteoarthrosis, unspecified whether generalized or localized, unspecified site   . Paroxysmal atrial fibrillation ([place]) 2003    (? chronic?) Off anticoag for a while due to falls.  Then apixaban started 12/2014.  [name] Peripheral neuropathy ([place])    Heredetary; with chronic neuropathic pain (Dr. Ella Bodo)  . Personal history of colonic adenoma 10/30/2012   Diminutive adenoma, consider repeat 2019 per GI  . PUD (peptic ulcer disease)   . PULMONARY HYPERTENSION, HX OF   . Secondary male hypogonadism 2017  . 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 Shortness of breath    with exertion: much improved s/p TAVR and treatment for CHF.  [name] Unspecified  glaucoma(365.9)   . Unspecified hereditary and idiopathic peripheral neuropathy approx age 37   bilat LE's, ? left arm, too.  Feet became progressively numb + left foot pain intermittently.  Pt may be trying a spinal stimulator (as of 05/2015)  . VENOUS INSUFFICIENCY   . VENTRAL HERNIA    BP 125/71 (BP Location: Left Wrist, Patient Position: Sitting, Cuff Size: Large)   Pulse 72   Resp 14   SpO2 95%   Opioid Risk Score:   Fall Risk Score:  `1  Depression screen PHQ 2/9  Depression screen [name] 2/9 09/23/2015 07/15/2015 03/16/2015 02/16/2015 09/16/2014  Decreased Interest 0 0 0 0 1  Down, Depressed, Hopeless 0 0 0 0 0  PHQ - 2 Score 0 0 0 0 1  Altered sleeping - - - - 2  Tired, decreased energy - - - - 2  Change in appetite - - - - 1  Feeling bad or failure about yourself  - - - - 0  Trouble concentrating - - - - 0  Moving slowly or fidgety/restless - - - - 0  Suicidal thoughts - - - - 0  PHQ-9 Score - - - - 6  Some recent data might be hidden    Review of Systems  HENT: Negative.   Eyes: Negative.   Respiratory: Negative.   Cardiovascular: Negative.   Gastrointestinal: Negative.   Endocrine: Negative.   Genitourinary: Negative.   Musculoskeletal: Positive for back pain and gait problem.  Skin: Negative.   Allergic/Immunologic: Negative.   Neurological: Positive for tremors, weakness and numbness.       Tingling  Hematological: Negative.   Psychiatric/Behavioral: Negative.   All other systems reviewed and are negative.      Objective:   Physical Exam  Constitutional: He is oriented to person, place, and time. He appears well-developed and well-nourished.  HENT:  Head: Normocephalic and atraumatic.  Neck: Normal range of motion. Neck supple.  Cardiovascular: Normal rate and regular rhythm.   Pulmonary/Chest: Effort normal and breath sounds normal.  Musculoskeletal:  Normal Muscle Bulk and Muscle Testing Reveals: Upper Extremities: Right: Decreased ROM 45 Degrees and  Muscle Strength 5/5 Left: Full ROM and Muscle Strength 5/5 Thoracic Paraspinal Tenderness: T-7-T-9 Lower Extremities: Full ROM  and Muscle Strength 5/5 Arises from Chair slowly using straight cane for support Narrow Based Gait  Neurological: He is alert and oriented to person, place, and time.  Skin: Skin is warm and dry.  Psychiatric: He has a normal mood and affect.  Nursing note and vitals reviewed.         Assessment & Plan:  1. 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 Continue Gabapentin Refilled:Oxycodone 10 mg one tablet QID#120.  We will continue the opioid monitoring program, this consists of regular clinic visits, examinations, urine drug screen, pill counts as well as use of [place] Controlled Substance Reporting System. 2. DDD lumbar spine with LBP: Neurology Following. Continue with activity and heat therapy.  S/P Permanent Spinal Stimulator Placed 09/10/15 via Dr. Maryjean Ka, with improvement of pain.  20 minutes of face to face patient care time was spent during this visit. All questions were encouraged and answered.   F/U in 1 month

## 2016-08-09 ENCOUNTER — Other Ambulatory Visit: Payer: Self-pay | Admitting: Family Medicine

## 2016-08-22 ENCOUNTER — Encounter: Payer: Self-pay | Admitting: Registered Nurse

## 2016-08-22 ENCOUNTER — Telehealth: Payer: Self-pay | Admitting: Registered Nurse

## 2016-08-22 ENCOUNTER — Encounter: Payer: Medicare Other | Attending: Registered Nurse | Admitting: Registered Nurse

## 2016-08-22 VITALS — BP 127/78 | HR 67

## 2016-08-22 DIAGNOSIS — G609 Hereditary and idiopathic neuropathy, unspecified: Secondary | ICD-10-CM | POA: Diagnosis not present

## 2016-08-22 DIAGNOSIS — G894 Chronic pain syndrome: Secondary | ICD-10-CM | POA: Diagnosis not present

## 2016-08-22 DIAGNOSIS — Z5181 Encounter for therapeutic drug level monitoring: Secondary | ICD-10-CM

## 2016-08-22 DIAGNOSIS — M79672 Pain in left foot: Secondary | ICD-10-CM | POA: Diagnosis present

## 2016-08-22 DIAGNOSIS — M1990 Unspecified osteoarthritis, unspecified site: Secondary | ICD-10-CM | POA: Diagnosis not present

## 2016-08-22 DIAGNOSIS — M109 Gout, unspecified: Secondary | ICD-10-CM | POA: Diagnosis not present

## 2016-08-22 DIAGNOSIS — Z79899 Other long term (current) drug therapy: Secondary | ICD-10-CM

## 2016-08-22 DIAGNOSIS — J449 Chronic obstructive pulmonary disease, unspecified: Secondary | ICD-10-CM | POA: Insufficient documentation

## 2016-08-22 DIAGNOSIS — G5792 Unspecified mononeuropathy of left lower limb: Secondary | ICD-10-CM | POA: Diagnosis not present

## 2016-08-22 DIAGNOSIS — G5791 Unspecified mononeuropathy of right lower limb: Secondary | ICD-10-CM

## 2016-08-22 DIAGNOSIS — E7800 Pure hypercholesterolemia, unspecified: Secondary | ICD-10-CM | POA: Diagnosis not present

## 2016-08-22 DIAGNOSIS — M48062 Spinal stenosis, lumbar region with neurogenic claudication: Secondary | ICD-10-CM

## 2016-08-22 DIAGNOSIS — K219 Gastro-esophageal reflux disease without esophagitis: Secondary | ICD-10-CM | POA: Insufficient documentation

## 2016-08-22 DIAGNOSIS — M792 Neuralgia and neuritis, unspecified: Secondary | ICD-10-CM

## 2016-08-22 DIAGNOSIS — I2510 Atherosclerotic heart disease of native coronary artery without angina pectoris: Secondary | ICD-10-CM | POA: Diagnosis not present

## 2016-08-22 MED ORDER — OXYCODONE HCL 10 MG PO TABS: 10.0000 mg | ORAL_TABLET | Freq: Four times a day (QID) | ORAL | 0 refills | Status: DC

## 2016-08-22 NOTE — Progress Notes (Signed)
Subjective:    Patient ID: Tim [name], male    DOB: [birth year], 76 y.o.   MRN: [id number]  HPI:  Tim [name] is a 76year old male who returns for follow up appointment for chronic pain and medication refill. He states his pain is located in his lower back and bilateral feet L>R. Also states the last few days his pain has increased in tensity in his bilateral feet, with tingling pain. He rates his pain 2. His current exercise regime is walking daily for 5 minutes daily.  Continue weaning of Tramadol, he verbalizes understanding.  Pain Inventory Average Pain 7 Pain Right Now 2 My pain is intermittent, sharp, burning, dull, stabbing, tingling and aching  In the last 24 hours, has pain interfered with the following? General activity 8 Relation with others 4 Enjoyment of life 7 What TIME of day is your pain at its worst? all Sleep (in general) Poor  Pain is worse with: walking, bending, standing and some activites Pain improves with: rest, pacing activities, medication, TENS, injections and spinal cord stimulators  Relief from Meds: 5  Mobility use a cane ability to climb steps?  yes do you drive?  yes  Function retired I need assistance with the following:  household duties and shopping  Neuro/Psych weakness numbness tremor tingling trouble walking dizziness  Prior Studies Any changes since last visit?  no  Physicians involved in your care Any changes since last visit?  no   Family History  Problem Relation Age of Onset  . Hypertension Mother   . Coronary artery disease Mother   . Heart attack Mother   . Neuropathy Mother   . Pulmonary fibrosis Father     asbestosis   Social History   Social History  . Marital status: Married    Spouse name: [name]  . Number of children: 0  . Years of education: 20   Occupational History  . engineering     retired   Social History Main Topics  . Smoking status: Never Smoker  . Smokeless tobacco: Never  Used  . Alcohol use No  . Drug use: No  . Sexual activity: Not Currently   Other Topics Concern  . Not on file   Social History Narrative   HSG, [name] - BS, [name] - MS-engineering, 2 years on PhD - [place]. Married - '65 - 94yr/divorced; '76- 3 yrs/divorced; '92 . No children. 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 ACP/Living Will - Yes CPR; long-term Mechanical ventilation as long as he was able to cognate; ok for long term artificial nutrition. Precondition being able to cognate and not to have too much pain.    Past Surgical History:  Procedure Laterality Date  . AMPUTATION Left 04/11/2013   Procedure: AMPUTATION DIGIT Left 3rd toe;  Surgeon: [name] MD;  [place] [facility]  Service: Orthopedics;  Laterality: Left;  Left 3rd toe amputation at [facility]  . CARDIAC CATHETERIZATION  1997; 03/10/16   1997 Non-obstructive disease.  03/2016 BMS to RCA, with 25% pDiag dz, o/w normal cors per cath 03/07/16.  .Marland KitchenCARDIAC CATHETERIZATION  12/24/2012   mild < 20% LCx, prox 30% RCA; LVEF 55-65% , moderate pulmonary HTN, moderate AS  . CARDIOVASCULAR STRESS TEST  05/11/16 ([facility])   Myocardial perfusion imaging:  No ischemia; scar in apex, global hypokinesis, EF 36%.  . Carotid dopplers  03/09/2016   [facility]: no hemodynamically significant stenosis on either side.  . CHOLECYSTECTOMY    .  COLONOSCOPY    . COLONOSCOPY [name] 10/30/2012   Procedure: COLONOSCOPY;  Surgeon: [name], MD;  [facility];  Service: Endoscopy;  Laterality: [name];  . CORONARY ANGIOPLASTY WITH STENT PLACEMENT  03/2016   [facility]: BMS to RCA-pt was placed on Brilinta.  [name] EYE SURGERY Bilateral cataract  . HEMORRHOID SURGERY    . INTRAOCULAR LENS INSERTION Bilateral   . KNEE SURGERY Right   . LEFT AND RIGHT HEART CATHETERIZATION WITH CORONARY ANGIOGRAM [name] 12/24/2012   Procedure: LEFT AND RIGHT HEART CATHETERIZATION WITH CORONARY ANGIOGRAM;  Surgeon: [name], MD;  Location: [facility]  CATH LAB;  Service: Cardiovascular;  Laterality: [name];  . LEG SURGERY Bilateral    lenghtening   . PACEMAKER PLACEMENT  04/13/2016   2nd deg HB after TAVR, pt had DC MDT PPM placed.  [name] SHOULDER ARTHROSCOPY  08/30/2011   Procedure: ARTHROSCOPY SHOULDER;  Surgeon: [name], MD;  Location: [facility];  Service: Orthopedics;  Laterality: Right;  Right Shoulder Arthroscopy, Debridement, and Decompression  . SPINAL CORD STIMULATOR INSERTION [name] 09/10/2015   Procedure: LUMBAR SPINAL CORD STIMULATOR INSERTION;  Surgeon: [name], MD;  Location: [facility];  Service: Neurosurgery;  Laterality: [name];  . 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  R big toe surg due to osteoarth  . TONSILLECTOMY    . traeculectomy Left    eye  . TRANSCATHETER AORTIC VALVE REPLACEMENT, TRANSFEMORAL  04/11/2016  . TRANSESOPHAGEAL ECHOCARDIOGRAM  03/09/2016   [facility]: EF 55-60%, PFO seen with bi-directional shunting, no thrombus in appendage.  . TRANSTHORACIC ECHOCARDIOGRAM  01/2015; 01/2016; 05/18/16   01/2015 No signif change in aortic stenosis (moderate).  01/2016 Severe LVH w/small LV cavity, EF 60-65%, grade I diast dysfxn.  05/2016 (s/p TAVR): EF 50-55%, grd I DD, biopros AV good.  [name] VITRECTOMY     Past Medical History:  Diagnosis Date  . ASTHMA   . BPH (benign prostatic hypertrophy)   . CAD (coronary artery disease)    Nonobstructive by 12/2012 cath; then 03/2016 he required BMS to RCA ([facility])  . CATARACT, HX OF   . Chronic combined systolic and diastolic CHF (congestive heart failure) ([place]) 05/2016   Mild; [facility]  . COPD   . Elevated transaminase level 02/2016   Suspect due to fatty liver (documented on u/s 2007). Plan repeat labs 03/2016.  .Marland KitchenEssential hypertension, benign   . Fatty liver 2007   u/s showed fatty liver with hepatosplenomegaly  . GERD (gastroesophageal reflux disease)   . Glaucoma   . GOUT   . Hallux valgus (acquired)   . HH (hiatus hernia)    . HYPERCHOLESTEROLEMIA-PURE   . Hypogonadism male   . IFG (impaired fasting glucose)    HbA1c jumped from 5.7% to 6.1% 03/2015---started metformin at that time.  . Lumbosacral neuritis   . Lumbosacral spondylosis    Lumbar spinal stenosis with neurogenic claudication--contributes to his chronic pain syndrome  . Normal memory function 08/2014   Neuropsychological testing ([facility]): no cognitive impairment or sign of neurodegenerative disorder.  Likely has adjustment d/o with mixed anxiety/depressed features and may benefit from low dose SNRI.    .Marland KitchenNormocytic anemia 03/2016   Mild-pt needs ferritin and vit B12 level checked (as of 03/22/16)  . NSTEMI (non-ST elevated myocardial infarction) ([facility] 03/20/2016   BMS to RCA  . Obesity hypoventilation syndrome ([place]   . Obesity, unspecified   . OSA on CPAP  8 cm H2O  . OSTEOARTHRITIS   . Osteoarthrosis, unspecified whether generalized or localized, unspecified site   . Paroxysmal atrial fibrillation ([place]) 2003    (? chronic?) Off anticoag for a while due to falls.  Then apixaban started 12/2014.  [name] Peripheral neuropathy ([place])    [place]; with chronic neuropathic pain (Dr. Ella Bodo)  . Personal history of colonic adenoma 10/30/2012   Diminutive adenoma, consider repeat 2019 per GI  . PUD (peptic ulcer disease)   . PULMONARY HYPERTENSION, HX OF   . Secondary male hypogonadism 2017  . 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 Shortness of breath    with exertion: much improved s/p TAVR and treatment for CHF.  [name] Unspecified glaucoma(365.9)   . Unspecified hereditary and idiopathic peripheral neuropathy approx age 65   bilat LE's, ? left arm, too.  Feet became progressively numb + left foot pain intermittently.  Pt may be trying a spinal stimulator (as of 05/2015)  . VENOUS INSUFFICIENCY   . VENTRAL HERNIA    There were no vitals taken for this  visit.  Opioid Risk Score:   Fall Risk Score:  `1  Depression screen PHQ 2/9  Depression screen [facility] 2/9 09/23/2015 07/15/2015 03/16/2015 02/16/2015 09/16/2014  Decreased Interest 0 0 0 0 1  Down, Depressed, Hopeless 0 0 0 0 0  PHQ - 2 Score 0 0 0 0 1  Altered sleeping - - - - 2  Tired, decreased energy - - - - 2  Change in appetite - - - - 1  Feeling bad or failure about yourself  - - - - 0  Trouble concentrating - - - - 0  Moving slowly or fidgety/restless - - - - 0  Suicidal thoughts - - - - 0  PHQ-9 Score - - - - 6  Some recent data might be hidden    Review of Systems  Constitutional: Negative.   HENT: Negative.   Eyes: Negative.   Respiratory: Positive for apnea and shortness of breath.   Cardiovascular: Negative.   Gastrointestinal: Positive for constipation.  Endocrine: Negative.   Genitourinary: Negative.   Musculoskeletal: Positive for gait problem.  Skin: Positive for rash.  Allergic/Immunologic: Negative.   Hematological: Bruises/bleeds easily.  Psychiatric/Behavioral: Positive for confusion.  All other systems reviewed and are negative.      Objective:   Physical Exam  Constitutional: He is oriented to person, place, and time. He appears well-developed and well-nourished.  HENT:  Head: Normocephalic and atraumatic.  Neck: Normal range of motion. Neck supple.  Cardiovascular: Normal rate and regular rhythm.   Pulmonary/Chest: Effort normal and breath sounds normal.  Musculoskeletal:  Normal Muscle Bulk and Muscle Testing Reveals: Upper Extremities: Right: Decreased ROM 45 Degrees and Muscle Strength 4/5 Left: Full ROM and Muscle Strength 5/5 Lower Extremities: Full ROM and Muscle Strength 5/5 Arises from chair slowly using cane for support Narrow Based Gait  Neurological: He is alert and oriented to person, place, and time.  Skin: Skin is warm and dry.  Psychiatric: He has a normal mood and affect.  Nursing note and vitals reviewed.          Assessment & Plan:  1. 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 Continue Gabapentin. O2/20/2018 Refilled:Oxycodone 10 mg one tablet QID#120.  We will continue the opioid monitoring program, this consists of regular clinic visits, examinations, urine  drug screen, pill counts as well as use of [place] Controlled Substance Reporting System. 2. DDD lumbar spine with LBP: Neurology Following. Continue with activity and heat therapy. 08/22/2016 S/P Permanent Spinal Stimulator Placed 09/10/15 via Dr. Maryjean Ka, with improvement of pain. 09/19/2016  20 minutes of face to face patient care time was spent during this visit. All questions were encouraged and answered.  F/U in 1 month

## 2016-08-22 NOTE — Telephone Encounter (Signed)
On 08/23/2015 the [place] was reviewed no conflict was seen on the [place] with multiple prescribers. [name] has a signed narcotic contract with our office. If there were any discrepancies this would have been reported to his physician.

## 2016-08-23 ENCOUNTER — Other Ambulatory Visit: Payer: Self-pay | Admitting: Cardiology

## 2016-08-23 ENCOUNTER — Other Ambulatory Visit: Payer: Self-pay | Admitting: Family Medicine

## 2016-08-24 ENCOUNTER — Ambulatory Visit: Payer: Medicare Other | Admitting: Family Medicine

## 2016-08-24 NOTE — Telephone Encounter (Signed)
[facility] [name]  RF request for metoprolol LOV: 05/24/16 Next ov: 08/25/16 Last written: 12/26/15 #30 w/ 6RF

## 2016-08-25 ENCOUNTER — Ambulatory Visit (INDEPENDENT_AMBULATORY_CARE_PROVIDER_SITE_OTHER): Payer: Medicare Other | Admitting: Family Medicine

## 2016-08-25 ENCOUNTER — Encounter: Payer: Self-pay | Admitting: Family Medicine

## 2016-08-25 VITALS — BP 128/76 | HR 94 | Temp 97.8°F | Resp 16 | Wt 312.0 lb

## 2016-08-25 DIAGNOSIS — Z7901 Long term (current) use of anticoagulants: Secondary | ICD-10-CM | POA: Diagnosis not present

## 2016-08-25 DIAGNOSIS — E662 Morbid (severe) obesity with alveolar hypoventilation: Secondary | ICD-10-CM | POA: Diagnosis not present

## 2016-08-25 DIAGNOSIS — R42 Dizziness and giddiness: Secondary | ICD-10-CM | POA: Diagnosis not present

## 2016-08-25 DIAGNOSIS — I10 Essential (primary) hypertension: Secondary | ICD-10-CM

## 2016-08-25 DIAGNOSIS — R509 Fever, unspecified: Secondary | ICD-10-CM | POA: Diagnosis not present

## 2016-08-25 DIAGNOSIS — R55 Syncope and collapse: Secondary | ICD-10-CM | POA: Diagnosis not present

## 2016-08-25 DIAGNOSIS — R350 Frequency of micturition: Secondary | ICD-10-CM

## 2016-08-25 DIAGNOSIS — R7303 Prediabetes: Secondary | ICD-10-CM | POA: Diagnosis not present

## 2016-08-25 LAB — CBC WITH DIFFERENTIAL/PLATELET
Basophils Absolute: 0 10*3/uL (ref 0.0–0.1)
Basophils Relative: 0.4 % (ref 0.0–3.0)
EOS PCT: 2 % (ref 0.0–5.0)
Eosinophils Absolute: 0.2 10*3/uL (ref 0.0–0.7)
HCT: 38.9 % — ABNORMAL LOW (ref 39.0–52.0)
Hemoglobin: 13.4 g/dL (ref 13.0–17.0)
LYMPHS ABS: 2.2 10*3/uL (ref 0.7–4.0)
Lymphocytes Relative: 24.6 % (ref 12.0–46.0)
MCHC: 34.5 g/dL (ref 30.0–36.0)
MCV: 89.1 fl (ref 78.0–100.0)
MONO ABS: 0.9 10*3/uL (ref 0.1–1.0)
MONOS PCT: 9.7 % (ref 3.0–12.0)
NEUTROS ABS: 5.6 10*3/uL (ref 1.4–7.7)
NEUTROS PCT: 63.3 % (ref 43.0–77.0)
PLATELETS: 149 10*3/uL — AB (ref 150.0–400.0)
RBC: 4.37 Mil/uL (ref 4.22–5.81)
RDW: 15.5 % (ref 11.5–15.5)
WBC: 8.8 10*3/uL (ref 4.0–10.5)

## 2016-08-25 LAB — POCT URINALYSIS DIPSTICK
BILIRUBIN UA: NEGATIVE
Glucose, UA: NEGATIVE
KETONES UA: NEGATIVE
Leukocytes, UA: NEGATIVE
Nitrite, UA: NEGATIVE
PH UA: 5
Protein, UA: NEGATIVE
RBC UA: NEGATIVE
SPEC GRAV UA: 1.01
Urobilinogen, UA: 0.2

## 2016-08-25 LAB — BASIC METABOLIC PANEL
BUN: 30 mg/dL — AB (ref 6–23)
CHLORIDE: 100 meq/L (ref 96–112)
CO2: 34 mEq/L — ABNORMAL HIGH (ref 19–32)
CREATININE: 1.07 mg/dL (ref 0.40–1.50)
Calcium: 9.6 mg/dL (ref 8.4–10.5)
GFR: 71.27 mL/min (ref >60.00)
GLUCOSE: 163 mg/dL — AB (ref 70–99)
Potassium: 4.2 mEq/L (ref 3.5–5.1)
Sodium: 142 mEq/L (ref 135–145)

## 2016-08-25 LAB — HEMOGLOBIN A1C: Hgb A1c MFr Bld: 6.1 % (ref 4.6–6.5)

## 2016-08-25 LAB — GLUCOSE, POCT (MANUAL RESULT ENTRY): POC GLUCOSE: 173 mg/dL — AB (ref 70–99)

## 2016-08-25 MED ORDER — CADEXOMER IODINE 0.9 % EX GEL: 1.0000 "application " | Freq: Every day | CUTANEOUS | 3 refills | Status: DC | PRN

## 2016-08-25 NOTE — Progress Notes (Addendum)
OFFICE VISIT  08/25/2016   CC:  Chief Complaint  Patient presents with  . Follow-up   HPI:    Patient is a 76 y.o. Caucasian male who presents for 3 mo f/u IFG, obesity hypoventilation syndrome, HTN. 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  Has lost 50 lbs purposefully since 07/2015.  Prediabetes:  No home glucose monitoring.  No polyuria or polydipsia.  Metformin--compliant.  HTN: No recent monitoring.  Occ bp check 120/70s.  Obesity hypoventilation: hasn't been as active lately due to having increase in dizziness/presyncope with walking distances more than 20 feet or so.  When tried to restart, he got a small blister on L great toe.   When walking he denies SOB when he begins to feel lightheaded, also denies CP or feeling palpitations. Says at a recent cardiology f/u 07/2016) his pacer was reprogrammed in some way but I could not understand his description of how today.  ROS: 2 d/a he got fever to 101 and felt like he was urinating more frequently than his usual.  He then says the next day he felt back to normal.  No cough.  No CP.  No melena or hematochezia or gross hematuria.   Past Medical History:  Diagnosis Date  . ASTHMA   . BPH (benign prostatic hypertrophy)   . CAD (coronary artery disease)    Nonobstructive by 12/2012 cath; then 03/2016 he required BMS to RCA ([facility])  . CATARACT, HX OF   . Chronic combined systolic and diastolic CHF (congestive heart failure) ([place]) 05/2016   Mild; [facility]ade[facility] 35m [facility]d [facility]  . COPD   . Elevated transaminase level 02/2016   Suspect due to fatty liver (documented on u/s 2007). Plan repeat labs 03/2016.  .Marland KitchenEssential hypertension, benign   . Fatty liver 2007   u/s showed fatty liver with hepatosplenomegaly  . GERD (gastroesophageal reflux disease)   . Glaucoma   . GOUT   . Hallux valgus (acquired)   . HH (hiatus hernia)   . HYPERCHOLESTEROLEMIA-PURE   . Hypogonadism  male   . IFG (impaired fasting glucose)    HbA1c jumped from 5.7% to 6.1% 03/2015---started metformin at that time.  . Lumbosacral neuritis   . Lumbosacral spondylosis    Lumbar spinal stenosis with neurogenic claudication--contributes to his chronic pain syndrome  . Normal memory function 08/2014   Neuropsychological testing ([facility]): no cognitive impairment or sign of neurodegenerative disorder.  Likely has adjustment d/o with mixed anxiety/depressed features and may benefit from low dose SNRI.    .Marland KitchenNormocytic anemia 03/2016   Mild-pt needs ferritin and vit B12 level checked (as of 03/22/16)  . [place]STEMI (non[place]ST elevated myocardial infarction) [place] 03/20[place]2016   BMS to RCA  . Ob[place] hypoventilation syndrome ([place]   . Obesity, unspecified   . OSA on CPAP    8 cm H2O  . OSTEOARTHRITIS   . Osteoarthrosis, unspecified whether generalized or localized, unspecified site   . Paroxysmal atrial fibrillation ([place]    (? chronic?) Off anticoag for a while due to falls.  Then apixaban started 12/2014.  .Marland KitchenPeripheral neuropathy ([place]    [place]; with chronic neuropathic pain (Dr. KElla Bodo  . Personal history of colonic adenoma 10/30/2012   Diminutive adenoma, consider repeat 2019 per GI  . PUD (peptic ulcer disease)   . PULMONARY HYPERTENSION, HX OF   . Secondary male hypogonadism 2017  . Severe aortic stenosis  by cath by [facility] 03/2016; CT surgery saw him and did TAVR 04/11/16 ([facility])  . Shortness of breath    with exertion: much improved s/p TAVR and treatment for CHF.  [name] Unspecified glaucoma(365.9)   . Unspecified hereditary and idiopathic peripheral neuropathy approx age 68   bilat LE's, ? left arm, too.  Feet became progressively numb + left foot pain intermittently.  Pt may be trying a spinal stimulator (as of 05/2015)  . VENOUS INSUFFICIENCY   . VENTRAL HERNIA     Past Surgical History:  Procedure Laterality Date  . AMPUTATION Left 04/11/2013   Procedure:  AMPUTATION DIGIT Left 3rd toe;  Surgeon: [name], MD;  Location: [place];  Service: Orthopedics;  Laterality: Left;  Left 3rd toe amputation at [facility]  . CARDIAC CATHETERIZATION  1997; 03/10/16   1997 Non-obstructive disease.  03/2016 BMS to RCA, with 25% pDiag dz, o/w normal cors per cath 03/07/16.  [name] CARDIAC CATHETERIZATION  12/24/2012   mild < 20% LCx, prox 30% RCA; LVEF 55-65% , moderate pulmonary HTN, moderate AS  . CARDIOVASCULAR STRESS TEST  05/11/16 ([facility])   Myocardial perfusion imaging:  No ischemia; scar in apex, global hypokinesis, EF 36%.  . Carotid dopplers  03/09/2016   [facility]: no hemodynamically significant stenosis on either side.  . CHOLECYSTECTOMY    . COLONOSCOPY    . COLONOSCOPY [name] 10/30/2012   Procedure: COLONOSCOPY;  Surgeon: [name], MD;  Location: [facility];  Service: Endoscopy;  Laterality: [name];  . CORONARY ANGIOPLASTY WITH STENT PLACEMENT  03/2016   [facility]: BMS to RCA-pt was placed on Brilinta.  [name] EYE SURGERY Bilateral cataract  . HEMORRHOID SURGERY    . INTRAOCULAR LENS INSERTION Bilateral   . KNEE SURGERY Right   . LEFT AND RIGHT HEART CATHETERIZATION WITH CORONARY ANGIOGRAM [name] 12/24/2012   Procedure: LEFT AND RIGHT HEART CATHETERIZATION WITH CORONARY ANGIOGRAM;  Surgeon: [name], MD;  [name] C[facility] LAB;  Service: Cardiovascular;  Laterality: [name];  . LEG SURGERY Bilateral    lenghtening   . PACEMAKER PLACEMENT  04/13/2016   2nd deg HB after TAVR, pt had DC MDT PPM placed.  [name] SHOULDER ARTHROSCOPY  08/30/2011   Procedure: ARTHROSCOPY SHOULDER;  Surgeon: [name], MD;  Location: [place];  Service: Orthopedics;  Laterality: Right;  Right Shoulder Arthroscopy, Debridement, and Decompression  . SPINAL CORD STIMULATOR INSERTION [name] 09/10/2015   Procedure: LUMBAR SPINAL CORD STIMULATOR INSERTION;  Surgeon: [name], MD;  Location: [place] [facility];  Service: Neurosurgery;  Laterality: [name];  . 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  R big toe  surg due to osteoarth  . TONSILLECTOMY    . traeculectomy Left    eye  . TRANSCATHETER AORTIC VALVE REPLACEMENT, TRANSFEMORAL  04/11/2016  . TRANSESOPHAGEAL ECHOCARDIOGRAM  03/09/2016   [facility]: EF 55-60%, PFO seen with bi-directional shunting, no thrombus in appendage.  . TRANSTHORACIC ECHOCARDIOGRAM  01/2015; 01/2016; 05/18/16   01/2015 No signif change in aortic stenosis (moderate).  01/2016 Severe LVH w/small LV cavity, EF 60-65%, grade I diast dysfxn.  05/2016 (s/p TAVR): EF 50-55%, grd I DD, biopros AV good.  [name] VITRECTOMY      Outpatient Medications Prior to Visit  Medication Sig Dispense Refill  . albuterol (PROVENTIL HFA;VENTOLIN HFA) 108 (90 BASE) MCG/ACT inhaler Inhale 2 puffs into the lungs 4 (four) times daily as needed for wheezing or shortness of breath.    . allopurinol (ZYLOPRIM) 300 MG tablet take 1  tablet by mouth once daily with food 30 tablet 11  . apixaban (ELIQUIS) 5 MG TABS tablet Take 1 tablet (5 mg total) by mouth 2 (two) times daily. 180 tablet 1  . aspirin EC 81 MG tablet Take by mouth.    . B Complex-C (B-COMPLEX WITH VITAMIN C) tablet Take 1 tablet by mouth daily.    . clotrimazole-betamethasone (LOTRISONE) cream apply to affected area twice a day 45 g 5  . ezetimibe (ZETIA) 10 MG tablet Take 1 tablet (10 mg total) by mouth daily. 90 tablet 1  . finasteride (PROSCAR) 5 MG tablet take 1 tablet by mouth once daily 90 tablet 1  . fluticasone (FLONASE) 50 MCG/ACT nasal spray Place 2 sprays into both nostrils as needed for allergies or rhinitis.    . Fluticasone-Salmeterol (ADVAIR) 250-50 MCG/DOSE AEPB Inhale 1 puff into the lungs daily as needed (for nasal congestion).     . gabapentin (NEURONTIN) 600 MG tablet take 1 tablet by mouth four times a day 120 tablet 3  . metFORMIN (GLUCOPHAGE) 500 MG tablet take 1 tablet by mouth twice a day with food 60 tablet 6  . metoprolol succinate (TOPROL-XL) 25 MG 24 hr tablet Take 1 tablet (25 mg total) by mouth daily. 90 tablet 1  .  metoprolol succinate (TOPROL-XL) 50 MG 24 hr tablet take 1 tablet by mouth once daily ALONG WITH 25 MG TO EQUAL 75MG 30 tablet 6  . Multiple Vitamin (MULTIVITAMIN) capsule Take 1 capsule by mouth daily.    . Niacin CR 1000 MG TBCR Take 1 tablet (1,000 mg total) by mouth 2 (two) times daily. 180 each 3  . omeprazole (PRILOSEC) 10 MG capsule take 1 capsule by mouth once daily 30 capsule 11  . Oxycodone HCl 10 MG TABS Take 1 tablet (10 mg total) by mouth every 6 (six) hours. 120 tablet 0  . potassium chloride SA (K-DUR,KLOR-CON) 20 MEQ tablet take 1 tablet by mouth once daily 90 tablet 1  . rosuvastatin (CRESTOR) 40 MG tablet Take 1 tablet (40 mg total) by mouth daily. 90 tablet 1  . torsemide (DEMADEX) 20 MG tablet take 2 tablets by mouth once daily 60 tablet 3  . traMADol-acetaminophen (ULTRACET) 37.5-325 MG tablet Take 1 tablet by mouth 4 (four) times daily as needed. (Patient taking differently: Take 1 tablet by mouth 2 (two) times daily. ) 120 tablet 5  . VITAMIN D, CHOLECALCIFEROL, PO Take 1 tablet by mouth daily.      No facility-administered medications prior to visit.     Allergies  Allergen Reactions  . Brimonidine Tartrate Shortness Of Breath    Alphagan-Shortness of breath  . Brimonidine Tartrate Palpitations  . Brinzolamide Shortness Of Breath    AZOPT- Shortness of breath  . Latanoprost Shortness Of Breath    XALATAN- Shortness of breath  . Nucynta [Tapentadol] Shortness Of Breath  . Sulfa Antibiotics Palpitations  . Timolol Maleate Shortness Of Breath and Other (See Comments)    TIMOPTIC- Aggravated asthma  . Cardizem  [Diltiazem Hcl] Swelling  . Diltiazem Swelling     leg swelling  . Rofecoxib Swelling     VIOXX- leg swelling  . Vancomycin Hives and Other (See Comments)    Possible "Red Man Syndrome"? > hives/blisters  . Codeine Other (See Comments)    Childhood reaction  . Celecoxib Other (See Comments)    CELLBREX-confusion  . Colchicine Diarrhea    diarrhea  .  Tape Rash    ROS As per HPI  PE: Blood pressure 128/76, pulse 94, temperature 97.8 F (36.6 C), temperature source Oral, resp. rate 16, weight (!) 312 lb (141.5 kg), SpO2 97 %. Gen: Alert, well appearing.  Patient is oriented to person, place, time, and situation. AFFECT: pleasant, lucid thought and speech. CV: S1 and S2 slightly distant, regular, rate around 95-100 at rest.  No rub or gallop.  He did have a soft systolic murmur. LUNGS: CTA bilat, nonlabored resps. EXT: 1+ pitting edema bilat. Right great toe: medial aspect with dry, pink pressure sore, non-tender and no surrounding erythema.  No swelling.  No fluctuance.  LABS:   CC UA today: NORMAL.  Lab Results  Component Value Date   TSH 1.67 02/09/2016   Lab Results  Component Value Date   WBC 9.1 05/01/2016   HGB 13.1 05/01/2016   HCT 40.0 05/01/2016   MCV 90.1 05/01/2016   PLT 214.0 05/01/2016   Lab Results  Component Value Date   CREATININE 0.82 05/01/2016   BUN 16 05/01/2016   NA 146 (H) 05/01/2016   K 4.1 05/01/2016   CL 102 05/01/2016   CO2 34 (H) 05/01/2016   Lab Results  Component Value Date   ALT 15 03/20/2016   AST 14 03/20/2016   ALKPHOS 83 03/20/2016   BILITOT 0.6 03/20/2016   Lab Results  Component Value Date   CHOL 120 11/23/2015   Lab Results  Component Value Date   HDL 49.10 11/23/2015   Lab Results  Component Value Date   LDLCALC 51 11/23/2015   Lab Results  Component Value Date   TRIG 100.0 11/23/2015   Lab Results  Component Value Date   CHOLHDL 2 11/23/2015   Lab Results  Component Value Date   PSA 0.23 11/23/2015   PSA 0.33 10/07/2014   PSA 0.40 09/25/2013   Lab Results  Component Value Date   HGBA1C 5.4 05/24/2016   IMPRESSION AND PLAN:  1) Dizziness/presyncope with ambulation: today in office when walking the 30 feet from exam room to lab he felt lightheaded and slightly presyncopal.  This sensation passed with 30 seconds of rest.  Glucose was 173 at that  time.  HR 100-105 and rhythm regular.  He denied CP or SOB.  I then walked with him another 25 feet or so with pulse ox attached to him and his oxygen stayed 96-98% and HR 110 max.  He did not seem SOB but he did get lightheaded again.  He rested briefly and sx's resolved.  We discussed the option of calling EMS for transport to ED for further evaluation, but he said he would rather go home and see how he felt the rest of the afternoon and if he didn't feel improved any then he'd call [id number]/[email or web address].  Since he felt absolutely no dizziness when sitting still, I felt ok with him driving himself home.  His wife was called and alerted that she would need to help him with wheelchair when he gets home.   No change in meds recommended today. Check CBC, BMET, and HbA1c today.  2) IFG/Prediabetes: great control last 2 HbA1c's.  Recheck this today, and decrease metformin to 518m qd, and recheck A1c in 3-6 mo.  3) HTN: The current medical regimen is effective;  continue present plan and medications.  4) Obesity hypoventilation syndrome: no hypoxia at rest or with ambulation here today. Wt loss is good but he says he still doesn't think this has helped his chronic DOE.  5) Recent 1d fever and increased  urinary frequency: UA today normal.  Will send urine for c/s for completeness. If WBC count comes back elevated, will likely start abx for possible prostatitis. Suspect he more likely has a viral syndrome, and this actually may be contributing to the dizziness with walking that he's been having worse lately.  An After Visit Summary was printed and given to the patient.  Spent 50 min with pt today, with >50% of this time spent in counseling and care coordination regarding the above problems, particularly #1.[name]  FOLLOW UP: Return for 10-14d (30 min) f/u dizziness/presyncope.  Signed:  Crissie Sickles, MD           08/25/2016  ADDENDUM 08/25/16 at 4:45 pm:  HbA1c 6.1%, so I advised him to continue metformin 500 mg BID  instead of cutting back to 1 tab per day. Also, BUN/Cr up; recommended he decrease his torsemide (demadex) to ONE 32m tab once daily. Recheck BMET at next f/u in 10-14 d.--PM

## 2016-08-27 LAB — URINE CULTURE: ORGANISM ID, BACTERIA: NO GROWTH

## 2016-09-01 ENCOUNTER — Ambulatory Visit (INDEPENDENT_AMBULATORY_CARE_PROVIDER_SITE_OTHER): Payer: Medicare Other | Admitting: Family Medicine

## 2016-09-01 ENCOUNTER — Encounter: Payer: Self-pay | Admitting: Family Medicine

## 2016-09-01 VITALS — BP 144/74 | HR 71 | Temp 98.2°F | Resp 16 | Ht 71.0 in | Wt 317.2 lb

## 2016-09-01 DIAGNOSIS — R7989 Other specified abnormal findings of blood chemistry: Secondary | ICD-10-CM

## 2016-09-01 DIAGNOSIS — E878 Other disorders of electrolyte and fluid balance, not elsewhere classified: Secondary | ICD-10-CM

## 2016-09-01 LAB — BASIC METABOLIC PANEL
BUN: 24 mg/dL — AB (ref 6–23)
CO2: 28 mEq/L (ref 19–32)
CREATININE: 0.93 mg/dL (ref 0.40–1.50)
Calcium: 9.5 mg/dL (ref 8.4–10.5)
Chloride: 102 mEq/L (ref 96–112)
GFR: 83.79 mL/min (ref >60.00)
GLUCOSE: 140 mg/dL — AB (ref 70–99)
Potassium: 4.8 mEq/L (ref 3.5–5.1)
Sodium: 139 mEq/L (ref 135–145)

## 2016-09-01 NOTE — Progress Notes (Signed)
OFFICE VISIT  09/01/2016   CC:  Chief Complaint  Patient presents with  . Follow-up    Dizziness/presyncope   HPI:    Patient is a 76 y.o. Caucasian male who presents for 1 week f/u postural dizziness with presyncope that he was having last visit. His labs indicated he may have been mildly dehydrated (BUN/Cr elevated above his baseline).  I decreased his demadex to one 19m tab qd instead of two.  He did reduce his demadex to 20 mg qd.   He feels slightly dizzy and lightheaded now.  No further significant worsening/severe episodes like he had in the office last week.  Things improved when he went home that day.  No vertigo.  No new hearing problems. Currently he feels like he is at his baseline for these sx's: it was much worse prior to his TAVR last fall.   Sitting in room now: mildly lightheaded, when he gets up and walks it gets worse.  No falls. He has stopped driving.  He has used his stationary bike daily and only felt like he had to rest extra one day. No palpitations  No chest pain. No headaches.  No SOB.  ROS: No focal weakness, no tremors, no vision abnormality, no slurred speech or swallowing problems. No paresthesias.  +Generalized weakness (chronic).   Past Medical History:  Diagnosis Date  . ASTHMA   . BPH (benign prostatic hypertrophy)   . CAD (coronary artery disease)    Nonobstructive by 12/2012 cath; then 03/2016 he required BMS to RCA ([facility])  . CATARACT, HX OF   . Chronic combined systolic and diastolic CHF (congestive heart failure) ([place] 05/2016   Mild; [facility]  . COPD   . Elevated transaminase level 02/2016   Suspect due to fatty liver (documented on u/s 2007). Plan repeat labs 03/2016.  . Marland Kitchenssential hypertension, benign   . Fatty liver 2007   u/s showed fatty liver with hepatosplenomegaly  . GERD (gastroesophageal reflux disease)   . Glaucoma   . GOUT   . Hallux valgus (acquired)   . HH (hiatus hernia)    . HYPERCHOLESTEROLEMIA-PURE   . Hypogonadism male   . IFG (impaired fasting glucose)    HbA1c jumped from 5.7% to 6.1% 03/2015---started metformin at that time.  . Lumbosacral neuritis   . Lumbosacral spondylosis    Lumbar spinal stenosis with neurogenic claudication--contributes to his chronic pain syndrome  . Normal memory function 08/2014   Neuropsychological testing ([facility]): no cognitive impairment or sign of neurodegenerative disorder.  Likely has adjustment d/o with mixed anxiety/depressed features and may benefit from low dose SNRI.    . Marland Kitchenormocytic anemia 03/2016   Mild-pt needs ferritin and vit B12 level checked (as of 03/22/16)  . NSTEMI (non-ST elevated myocardial infarction) ([place]/18/2017   BMS to RCA  . Obesity hypoventilation syndrome ([place]  . Obesity, unspecified   . OSA on CPAP    8 cm H2O  . OSTEOARTHRITIS   . Osteoarthrosis, unspecified whether generalized or localized, unspecified site   . Paroxysmal atrial fibrillation ([place]    (? chronic?) Off anticoag for a while due to falls.  Then apixaban started 12/2014.  . Marland Kitcheneripheral neuropathy ([facility]   [facility]; with chronic neuropathic pain (Dr. KiElla Bodo . Personal history of colonic adenoma 10/30/2012   Diminutive adenoma, consider repeat 2019 per GI  . PUD (peptic ulcer disease)   . PULMONARY HYPERTENSION, HX OF   .  Secondary male hypogonadism 2017  . 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 Shortness of breath    with exertion: much improved s/p TAVR and treatment for CHF.  [name] Unspecified glaucoma(365.9)   . Unspecified hereditary and idiopathic peripheral neuropathy approx age 26   bilat LE's, ? left arm, too.  Feet became progressively numb + left foot pain intermittently.  Pt may be trying a spinal stimulator (as of 05/2015)  . VENOUS INSUFFICIENCY   . VENTRAL HERNIA     Past Surgical History:  Procedure Laterality  Date  . AMPUTATION Left 04/11/2013   Procedure: AMPUTATION DIGIT Left 3rd toe;  Surgeon: [name], MD;  [facility];  Service: Orthopedics;  Laterality: Left;  Left 3rd toe amputation at [facility]  . CARDIAC CATHETERIZATION  1997; 03/10/16   1997 Non-obstructive disease.  03/2016 BMS to RCA, with 25% pDiag dz, o/w normal cors per cath 03/07/16.  [name] CARDIAC CATHETERIZATION  12/24/2012   mild < 20% LCx, prox 30% RCA; LVEF 55-65% , moderate pulmonary HTN, moderate AS  . CARDIOVASCULAR STRESS TEST  05/11/16 ([facility])   Myocardial perfusion imaging:  No ischemia; scar in apex, global hypokinesis, EF 36%.  . Carotid dopplers  03/09/2016   [facility]: no hemodynamically significant stenosis on either side.  . CHOLECYSTECTOMY    . COLONOSCOPY    . COLONOSCOPY [name] 10/30/2012   Procedure: COLONOSCOPY;  Surgeon: [name], MD;  Location: [facility];  Service: Endoscopy;  Laterality: [name];  . CORONARY ANGIOPLASTY WITH STENT PLACEMENT  03/2016   [facility]: BMS to RCA-pt was placed on Brilinta.  [name] EYE SURGERY Bilateral cataract  . HEMORRHOID SURGERY    . INTRAOCULAR LENS INSERTION Bilateral   . KNEE SURGERY Right   . LEFT AND RIGHT HEART CATHETERIZATION WITH CORONARY ANGIOGRAM [name] 12/24/2012   Procedure: LEFT AND RIGHT HEART CATHETERIZATION WITH CORONARY ANGIOGRAM;  Surgeon: [name], MD;  Location: [facility] CATH LAB;  Service: Cardiovascular;  Laterality: [name];  . LEG SURGERY Bilateral    lenghtening   . PACEMAKER PLACEMENT  04/13/2016   2nd deg HB after TAVR, pt had DC MDT PPM placed.  [name] SHOULDER ARTHROSCOPY  08/30/2011   Procedure: ARTHROSCOPY SHOULDER;  Surgeon: [name], MD;  Location: [place];  Service: Orthopedics;  Laterality: Right;  Right Shoulder Arthroscopy, Debridement, and Decompression  . SPINAL CORD STIMULATOR INSERTION [name] 09/10/2015   Procedure: LUMBAR SPINAL CORD STIMULATOR INSERTION;  Surgeon: [name], MD;  Location: [place] [facility];  Service: Neurosurgery;  Laterality: [name];  . 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  R big toe surg due to osteoarth  . TONSILLECTOMY    . traeculectomy Left    eye  . TRANSCATHETER AORTIC VALVE REPLACEMENT, TRANSFEMORAL  04/11/2016  . TRANSESOPHAGEAL ECHOCARDIOGRAM  03/09/2016   [facility]: EF 55-60%, PFO seen with bi-directional shunting, no thrombus in appendage.  . TRANSTHORACIC ECHOCARDIOGRAM  01/2015; 01/2016; 05/18/16   01/2015 No signif change in aortic stenosis (moderate).  01/2016 Severe LVH w/small LV cavity, EF 60-65%, grade I diast dysfxn.  05/2016 (s/p TAVR): EF 50-55%, grd I DD, biopros AV good.  [name] VITRECTOMY      Outpatient Medications Prior to Visit  Medication Sig Dispense Refill  . albuterol (PROVENTIL HFA;VENTOLIN HFA) 108 (90 BASE) MCG/ACT inhaler Inhale 2 puffs into the lungs 4 (four) times daily as needed for wheezing or shortness of  breath.    . allopurinol (ZYLOPRIM) 300 MG tablet take 1 tablet by mouth once daily with food 30 tablet 11  . apixaban (ELIQUIS) 5 MG TABS tablet Take 1 tablet (5 mg total) by mouth 2 (two) times daily. 180 tablet 1  . aspirin EC 81 MG tablet Take by mouth.    . B Complex-C (B-COMPLEX WITH VITAMIN C) tablet Take 1 tablet by mouth daily.    . cadexomer iodine (IODOSORB) 0.9 % gel Apply 1 application topically daily as needed for wound care. 10 g 3  . clotrimazole-betamethasone (LOTRISONE) cream apply to affected area twice a day 45 g 5  . ezetimibe (ZETIA) 10 MG tablet Take 1 tablet (10 mg total) by mouth daily. 90 tablet 1  . finasteride (PROSCAR) 5 MG tablet take 1 tablet by mouth once daily 90 tablet 1  . fluticasone (FLONASE) 50 MCG/ACT nasal spray Place 2 sprays into both nostrils as needed for allergies or rhinitis.    . Fluticasone-Salmeterol (ADVAIR) 250-50 MCG/DOSE AEPB Inhale 1 puff into the lungs daily as needed (for nasal congestion).     . gabapentin (NEURONTIN) 600 MG tablet take 1 tablet by mouth four times a day 120 tablet 3  . metFORMIN (GLUCOPHAGE) 500 MG tablet take 1  tablet by mouth twice a day with food 60 tablet 6  . metoprolol succinate (TOPROL-XL) 25 MG 24 hr tablet Take 1 tablet (25 mg total) by mouth daily. 90 tablet 1  . metoprolol succinate (TOPROL-XL) 50 MG 24 hr tablet take 1 tablet by mouth once daily ALONG WITH 25 MG TO EQUAL 75MG 30 tablet 6  . Multiple Vitamin (MULTIVITAMIN) capsule Take 1 capsule by mouth daily.    . Niacin CR 1000 MG TBCR Take 1 tablet (1,000 mg total) by mouth 2 (two) times daily. 180 each 3  . omeprazole (PRILOSEC) 10 MG capsule take 1 capsule by mouth once daily 30 capsule 11  . Oxycodone HCl 10 MG TABS Take 1 tablet (10 mg total) by mouth every 6 (six) hours. 120 tablet 0  . potassium chloride SA (K-DUR,KLOR-CON) 20 MEQ tablet take 1 tablet by mouth once daily 90 tablet 1  . rosuvastatin (CRESTOR) 40 MG tablet Take 1 tablet (40 mg total) by mouth daily. 90 tablet 1  . torsemide (DEMADEX) 20 MG tablet take 2 tablets by mouth once daily 60 tablet 3  . traMADol-acetaminophen (ULTRACET) 37.5-325 MG tablet Take 1 tablet by mouth 4 (four) times daily as needed. (Patient taking differently: Take 1 tablet by mouth 2 (two) times daily. ) 120 tablet 5  . VITAMIN D, CHOLECALCIFEROL, PO Take 1 tablet by mouth daily.      No facility-administered medications prior to visit.     Allergies  Allergen Reactions  . Brimonidine Tartrate Shortness Of Breath    Alphagan-Shortness of breath  . Brimonidine Tartrate Palpitations  . Brinzolamide Shortness Of Breath    AZOPT- Shortness of breath  . Latanoprost Shortness Of Breath    XALATAN- Shortness of breath  . Nucynta [Tapentadol] Shortness Of Breath  . Sulfa Antibiotics Palpitations  . Timolol Maleate Shortness Of Breath and Other (See Comments)    TIMOPTIC- Aggravated asthma  . Cardizem  [Diltiazem Hcl] Swelling  . Diltiazem Swelling     leg swelling  . Rofecoxib Swelling     VIOXX- leg swelling  . Vancomycin Hives and Other (See Comments)    Possible "Red Man Syndrome"? >  hives/blisters  . Codeine Other (See Comments)  Childhood reaction  . Celecoxib Other (See Comments)    CELLBREX-confusion  . Colchicine Diarrhea    diarrhea  . Tape Rash    ROS As per HPI  PE: Blood pressure (!) 144/74, pulse 71, temperature 98.2 F (36.8 C), temperature source Oral, resp. rate 16, height 5' 11"  (1.803 m), weight (!) 317 lb 4 oz (143.9 kg), SpO2 97 %.Room air.  Body mass index is 44.25 kg/m. Repeat bp sitting 99/60, P 78.  Standing after 1 min: 116/67, P 78. Gen: Alert, well appearing.  Patient is oriented to person, place, time, and situation. AFFECT: pleasant but flat affect, lucid thought and speech. CV: RRR, 80 by me, soft systolic murmur that is loudest at aortic area.  No rub or gallop. LUNGS: CTA bilat, nonlabored resps.  Exp phase is mildly prolonged. EXT: no clubbing or cyanosis, with trace to 1+ pitting edema in both LL's. Neuro: CN 2-12 intact bilaterally, strength 5/5 in proximal and distal upper extremities and lower extremities bilaterally.  No tremor.  FNF normal  No pronator drift.   LABS:    Chemistry      Component Value Date/Time   NA 139 09/01/2016 1354   K 4.8 09/01/2016 1354   CL 102 09/01/2016 1354   CO2 28 09/01/2016 1354   BUN 24 (H) 09/01/2016 1354   CREATININE 0.93 09/01/2016 1354   CREATININE 0.71 02/09/2016 1333      Component Value Date/Time   CALCIUM 9.5 09/01/2016 1354   ALKPHOS 83 03/20/2016 1615   AST 14 03/20/2016 1615   ALT 15 03/20/2016 1615   BILITOT 0.6 03/20/2016 1615     Lab Results  Component Value Date   WBC 8.8 08/25/2016   HGB 13.4 08/25/2016   HCT 38.9 (L) 08/25/2016   MCV 89.1 08/25/2016   PLT 149.0 (L) 08/25/2016   IMPRESSION AND PLAN:  Chronic disequilibrium sensation: worse with ambulation. He does not have anything acute.  He is back at his "baseline".  He may have had some benefit from decreasing his demadex from 40 to 19m qd. No clear acute CV or pulmonary problem.  Last visit I walked  with him in the halls as much as he could tolerate and he did not desaturate.  Today his orthostatics were negative--did not correlate with his symptoms.   Will ask neurologist to see him for further evaluation and management ([facility], so we'll try to arrange for him to get back to see her).  An After Visit Summary was printed and given to the patient.  Spent 35 min with pt today, with >50% of this time spent in counseling and care coordination regarding the above problems.  FOLLOW UP: Return in about 2 months (around 11/01/2016) for routine chronic illness f/u.  Signed:  PCrissie Sickles MD           09/01/2016

## 2016-09-01 NOTE — Progress Notes (Signed)
Pre visit review using our clinic review tool, if applicable. No additional management support is needed unless otherwise documented below in the visit note. 

## 2016-09-08 ENCOUNTER — Telehealth: Payer: Self-pay | Admitting: Family Medicine

## 2016-09-08 MED ORDER — CADEXOMER IODINE 0.9 % EX GEL: 1.0000 "application " | Freq: Every day | CUTANEOUS | 3 refills | Status: DC | PRN

## 2016-09-08 NOTE — Telephone Encounter (Signed)
Patient is requesting a new prescription be placed for cadexomer iodine (IODOSORB) 0.9 % gel.  There are no more refills.  He would also like to know if his referral has been put in to see Dr. Posey Pronto for Neurology.  (strong preference for early afternoon just in case [facility] will schedule the neurology appt for him.)  Thank you,  -LL

## 2016-09-08 NOTE — Telephone Encounter (Signed)
After speaking to pharmacy.  Rx was labeled as "decline" because patient did not contact pharmacy until 09/06/16 (which is why the pharmacy didn't even realize he had an [facility] tech took a lot of time even locating this RX ) and patient will need new RX in order for them to fill it, unfortunately, this is not a medication they have in stock and it will have to be ordered and will be at the pharmacy 09/11/16 after noon at the earliest.   Also, spoke to [facility] and was told that since patient was seen 02/19/2015 that he is still considered a patient there and would not need a new referral.  That being said, receptionist did tell me that Dr Posey Pronto is now scheduling for early afternoons, as requested by patient, in July.    Left detailed message on pt's phone regarding above documented information, okay per DPR.  I also advised him to just contact [facility] neuro to schedule his next appointment.

## 2016-09-08 NOTE — Telephone Encounter (Signed)
OK.  I eRx'd this med to his pharmacy again so they could order it for him.-thx

## 2016-09-25 ENCOUNTER — Ambulatory Visit (INDEPENDENT_AMBULATORY_CARE_PROVIDER_SITE_OTHER): Payer: Medicare Other | Admitting: Family

## 2016-09-25 ENCOUNTER — Encounter (INDEPENDENT_AMBULATORY_CARE_PROVIDER_SITE_OTHER): Payer: Self-pay | Admitting: Orthopedic Surgery

## 2016-09-25 DIAGNOSIS — I872 Venous insufficiency (chronic) (peripheral): Secondary | ICD-10-CM

## 2016-09-25 DIAGNOSIS — S81811A Laceration without foreign body, right lower leg, initial encounter: Secondary | ICD-10-CM | POA: Diagnosis not present

## 2016-09-25 NOTE — Progress Notes (Signed)
Office Visit Note   Patient: Tim [name]           Date of Birth: [birth year]           MRN: [id number] Visit Date: 09/25/2016              Requested by: Tammi Sou, MD [id number] [place], [place] [address] PCP: Tammi Sou, MD  Chief Complaint  Patient presents with  . Right Leg - Pain    HPI: The patient is a 76 sexual gentleman who presents today for evaluation of a skin tear to his right leg. He had cut his anterior shin on a piece of metal. Initially presents to the know about emergency room in [place]. There he had Steri-Strips applied did have a tetanus shot. Was given a 10 day course of doxycycline as well. Today he states he is cleansing daily and applying Bactroban dressing. Does wear his CircAid compression over top of this. Does have some bloody drainage with changing the dressings  Assessment & Plan: Visit Diagnoses:  1. Skin tear of right lower leg without complication, initial encounter   2. Venous (peripheral) insufficiency     Plan: Will have him continue with his daily wound care. Continue with compression garments. He'll follow-up in 2 more weeks to check the progress of his wound healing. Stated that don't feel he needs a refill on his doxycycline at this time  Follow-Up Instructions: Return in about 2 weeks (around 10/09/2016).   Ortho Exam Physical Exam  Constitutional: Appears well-developed.  Head: Normocephalic.  Eyes: EOM are normal.  Neck: Normal range of motion.  Cardiovascular: Normal rate.   Pulmonary/Chest: Effort normal.  Neurological: Is alert.  Skin: Skin is warm.  Psychiatric: Has a normal mood and affect. Right lower extremity with brawny skin color changes. Does have pitting edema despite CircAid wear. There is no cellulitis does have some erythema. Steri-Strips remain intact we'll leave these until they fall off on their own. There is scant bloody drainage no purulence no odor no surrounding maceration no  surrounding cellulitis.   Imaging: No results found.  Labs: Lab Results  Component Value Date   HGBA1C 6.1 08/25/2016   HGBA1C 5.4 05/24/2016   HGBA1C 5.4 12/14/2015   ESRSEDRATE 20 02/09/2016   ESRSEDRATE 36 (H) 01/28/2014   LABURIC 3.5 (L) 09/10/2013   LABORGA NO GROWTH 08/25/2016    Orders:  No orders of the defined types were placed in this encounter.  No orders of the defined types were placed in this encounter.    Procedures: No procedures performed  Clinical Data: No additional findings.  ROS: Review of Systems  Constitutional: Negative for chills and fever.  Cardiovascular: Positive for leg swelling.  Skin: Positive for wound. Negative for color change and rash.    Objective: Vital Signs: There were no vitals taken for this visit.  Specialty Comments:  No specialty comments available.  PMFS History: Patient Active Problem List   Diagnosis Date Noted  . Neuropathic pain of foot, left 06/20/2016  . 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 Status post amputation of left great toe ([place]) 06/08/2015  . Spinal stenosis of lumbar region 02/16/2015  . Hereditary and idiopathic peripheral neuropathy 01/12/2015  . Oral thrush 12/28/2014  . Postural dizziness 05/24/2014  . Paresthesias with subjective weakness 01/30/2014  . Bilateral thigh pain 01/30/2014  . Abdominal wall pain in right upper quadrant 01/22/2014  . Gross hematuria 09/25/2013  . Intertrigo 09/25/2013  .  IBS (irritable bowel syndrome) 09/11/2013  . Chronic pain syndrome 08/06/2013  . CAD (coronary artery disease), native coronary artery 12/25/2012  . Paroxysmal atrial fibrillation ([place]) 12/25/2012  . Moderate aortic stenosis 12/25/2012  . Aortic stenosis 12/24/2012  . Personal history of colonic adenoma 10/30/2012  . Diverticulosis of colon (without mention of hemorrhage) 10/30/2012  . Internal hemorrhoids 10/30/2012  . Change in bowel habits intermittent loose stools 10/30/2012  . Routine health  maintenance 06/09/2012  . Hereditary peripheral neuropathy 10/10/2011  . Long term current use of anticoagulant - apixiban 08/16/2010  . Dyspnea on exertion 09/08/2009  . HYPERCHOLESTEROLEMIA-PURE 01/13/2009  . Essential hypertension, benign 01/13/2009  . EDEMA 01/13/2009  . GOUT 01/12/2009  . GLAUCOMA 01/12/2009  . Venous (peripheral) insufficiency 01/12/2009  . COPD (chronic obstructive pulmonary disease) ([place]) 01/12/2009  . VENTRAL HERNIA 01/12/2009  . OSTEOARTHRITIS 01/12/2009  . ARTHRITIS 01/12/2009  . DEGENERATIVE DISC DISEASE 01/12/2009  . CATARACT, HX OF 01/12/2009  . HEART MURMUR, HX OF 01/12/2009  . BENIGN PROSTATIC HYPERTROPHY, HX OF 01/12/2009  . Obesity 01/17/2007  . OBSTRUCTIVE SLEEP APNEA 01/17/2007  . ASTHMA 01/17/2007  . GERD 01/17/2007  . HALLUX VALGUS, ACQUIRED 01/17/2007  . PULMONARY HYPERTENSION, HX OF 01/17/2007   Past Medical History:  Diagnosis Date  . ASTHMA   . BPH (benign prostatic hypertrophy)   . CAD (coronary artery disease)    Nonobstructive by 12/2012 cath; then 03/2016 he required BMS to RCA ([facility])  . CATARACT, HX OF   . Chronic combined systolic and diastolic CHF (congestive heart failure) ([place]) 05/2016   Mild; [facility]  . COPD   . Elevated transaminase level 02/2016   Suspect due to fatty liver (documented on u/s 2007). Plan repeat labs 03/2016.  .Marland KitchenEssential hypertension, benign   . Fatty liver 2007   u/s showed fatty liver with hepatosplenomegaly  . GERD (gastroesophageal reflux disease)   . Glaucoma   . GOUT   . Hallux valgus (acquired)   . HH (hiatus hernia)   . HYPERCHOLESTEROLEMIA-PURE   . Hypogonadism male   . IFG (impaired fasting glucose)    HbA1c jumped from 5.7% to 6.1% 03/2015---started metformin at that time.  . Lumbosacral neuritis   . Lumbosacral spondylosis    Lumbar spinal stenosis with neurogenic claudication--contributes to his chronic pain syndrome  . Normal memory  function 08/2014   Neuropsychological testing ([facility]): no cognitive impairment or sign of neurodegenerative disorder.  Likely has adjustment d/o with mixed anxiety/depressed features and may benefit from low dose SNRI.    .Marland KitchenNormocytic anemia 03/2016   Mild-pt needs ferritin and vit B12 level checked (as of 03/22/16)  . NSTEMI (non-ST elevated myocardial infarction) ([place] 03/20/2016   BMS to RCA  . Obesity hypoventilation syndrome ([place]   . Obesity, unspecified   . OSA on CPAP    8 cm H2O  . OSTEOARTHRITIS   . Osteoarthrosis, unspecified whether generalized or localized, unspecified site   . Paroxysmal atrial fibrillation ([place] 2003    (? chronic?) Off anticoag for a while due to falls.  Then apixaban started 12/2014.  .Marland KitchenPeripheral neuropathy ([place]    Heredetary; with chronic neuropathic pain (Dr. KElla Bodo  . Personal history of colonic adenoma 10/30/2012   Diminutive adenoma, consider repeat 2019 per GI  . PUD (peptic ulcer disease)   . PULMONARY HYPERTENSION, HX OF   . Secondary male hypogonadism 2017  . Severe aortic stenosis    by cath by  [facility] 03/2016; CT surgery saw him and did TAVR 04/11/16 ([facility])  . Shortness of breath    with exertion: much improved s/p TAVR and treatment for CHF.  [name] Unspecified glaucoma(365.9)   . Unspecified hereditary and idiopathic peripheral neuropathy approx age 73   bilat LE's, ? left arm, too.  Feet became progressively numb + left foot pain intermittently.  Pt may be trying a spinal stimulator (as of 05/2015)  . VENOUS INSUFFICIENCY   . VENTRAL HERNIA     Family History  Problem Relation Age of Onset  . Hypertension Mother   . Coronary artery disease Mother   . Heart attack Mother   . Neuropathy Mother   . Pulmonary fibrosis Father     asbestosis    Past Surgical History:  Procedure Laterality Date  . AMPUTATION Left 04/11/2013   Procedure: AMPUTATION DIGIT Left 3rd toe;  Surgeon: [name], MD;  Location: [place];   Service: Orthopedics;  Laterality: Left;  Left 3rd toe amputation at [facility]  . CARDIAC CATHETERIZATION  1997; 03/10/16   1997 Non-obstructive disease.  03/2016 BMS to RCA, with 25% pDiag dz, o/w normal cors per cath 03/07/16.  [name] CARDIAC CATHETERIZATION  12/24/2012   mild < 20% LCx, prox 30% RCA; LVEF 55-65% , moderate pulmonary HTN, moderate AS  . CARDIOVASCULAR STRESS TEST  05/11/16 ([facility])   Myocardial perfusion imaging:  No ischemia; scar in apex, global hypokinesis, EF 36%.  . Carotid dopplers  03/09/2016   [facility]: no hemodynamically significant stenosis on either side.  . CHOLECYSTECTOMY    . COLONOSCOPY    . COLONOSCOPY [name] 10/30/2012   Procedure: COLONOSCOPY;  Surgeon: [name], MD;  [facility];  Service: Endoscopy;  Laterality: [name];  . CORONARY ANGIOPLASTY WITH STENT PLACEMENT  03/2016   [facility]: BMS to RCA-pt was placed on Brilinta.  [name] EYE SURGERY Bilateral cataract  . HEMORRHOID SURGERY    . INTRAOCULAR LENS INSERTION Bilateral   . KNEE SURGERY Right   . LEFT AND RIGHT HEART CATHETERIZATION WITH CORONARY ANGIOGRAM [name] 12/24/2012   Procedure: LEFT AND RIGHT HEART CATHETERIZATION WITH CORONARY ANGIOGRAM;  Surgeon: [name], MD;  Location[name] CATH LAB;  Service: Cardiovascular;  Laterality: [name];  . LEG SURGERY Bilateral    lenghtening   . PACEMAKER PLACEMENT  04/13/2016   2nd deg HB after TAVR, pt had DC MDT PPM placed.  [name] SHOULDER ARTHROSCOPY  08/30/2011   Procedure: ARTHROSCOPY SHOULDER;  Surgeon: [name], MD;  Location: [place];  Service: Orthopedics;  Laterality: Right;  Right Shoulder Arthroscopy, Debridement, and Decompression  . SPINAL CORD STIMULATOR INSERTION [name] 09/10/2015   Procedure: LUMBAR SPINAL CORD STIMULATOR INSERTION;  Surgeon: [name], MD;  Location: [place] NEURO ORS;  Service: Neurosurgery;  Laterality: [name];  . 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  R big toe surg due to osteoarth  . TONSILLECTOMY    . traeculectomy Left    eye  .  TRANSCATHETER AORTIC VALVE REPLACEMENT, TRANSFEMORAL  04/11/2016  . TRANSESOPHAGEAL ECHOCARDIOGRAM  03/09/2016   [facility]: EF 55-60%, PFO seen with bi-directional shunting, no thrombus in appendage.  . TRANSTHORACIC ECHOCARDIOGRAM  01/2015; 01/2016; 05/18/16   01/2015 No signif change in aortic stenosis (moderate).  01/2016 Severe LVH w/small LV cavity, EF 60-65%, grade I diast dysfxn.  05/2016 (s/p TAVR): EF 50-55%, grd I DD, biopros AV good.  [name] VITRECTOMY     Social History   Occupational History  . Engineer, production  retired   Social History Main Topics  . Smoking status: Never Smoker  . Smokeless tobacco: Never Used  . Alcohol use No  . Drug use: No  . Sexual activity: Not Currently

## 2016-09-26 ENCOUNTER — Encounter: Payer: Self-pay | Admitting: Registered Nurse

## 2016-09-26 ENCOUNTER — Encounter: Payer: Medicare Other | Attending: Registered Nurse | Admitting: Registered Nurse

## 2016-09-26 VITALS — BP 159/76 | HR 67 | Resp 14

## 2016-09-26 DIAGNOSIS — G894 Chronic pain syndrome: Secondary | ICD-10-CM

## 2016-09-26 DIAGNOSIS — M109 Gout, unspecified: Secondary | ICD-10-CM | POA: Insufficient documentation

## 2016-09-26 DIAGNOSIS — J449 Chronic obstructive pulmonary disease, unspecified: Secondary | ICD-10-CM | POA: Diagnosis not present

## 2016-09-26 DIAGNOSIS — G609 Hereditary and idiopathic neuropathy, unspecified: Secondary | ICD-10-CM | POA: Diagnosis not present

## 2016-09-26 DIAGNOSIS — Z79899 Other long term (current) drug therapy: Secondary | ICD-10-CM | POA: Diagnosis not present

## 2016-09-26 DIAGNOSIS — M1990 Unspecified osteoarthritis, unspecified site: Secondary | ICD-10-CM | POA: Insufficient documentation

## 2016-09-26 DIAGNOSIS — M48062 Spinal stenosis, lumbar region with neurogenic claudication: Secondary | ICD-10-CM | POA: Diagnosis not present

## 2016-09-26 DIAGNOSIS — E7800 Pure hypercholesterolemia, unspecified: Secondary | ICD-10-CM | POA: Insufficient documentation

## 2016-09-26 DIAGNOSIS — K219 Gastro-esophageal reflux disease without esophagitis: Secondary | ICD-10-CM | POA: Insufficient documentation

## 2016-09-26 DIAGNOSIS — Z5181 Encounter for therapeutic drug level monitoring: Secondary | ICD-10-CM | POA: Diagnosis not present

## 2016-09-26 DIAGNOSIS — M79672 Pain in left foot: Secondary | ICD-10-CM | POA: Diagnosis present

## 2016-09-26 DIAGNOSIS — M792 Neuralgia and neuritis, unspecified: Secondary | ICD-10-CM

## 2016-09-26 DIAGNOSIS — G5791 Unspecified mononeuropathy of right lower limb: Secondary | ICD-10-CM | POA: Diagnosis not present

## 2016-09-26 DIAGNOSIS — I2510 Atherosclerotic heart disease of native coronary artery without angina pectoris: Secondary | ICD-10-CM | POA: Diagnosis not present

## 2016-09-26 DIAGNOSIS — G5792 Unspecified mononeuropathy of left lower limb: Secondary | ICD-10-CM

## 2016-09-26 MED ORDER — OXYCODONE HCL 10 MG PO TABS: 10.0000 mg | ORAL_TABLET | Freq: Four times a day (QID) | ORAL | 0 refills | Status: DC

## 2016-09-26 NOTE — Progress Notes (Signed)
Subjective:    Patient ID: Tim [name], male    DOB: 09-17-1940, 76 y.o.   MRN: [id number]  HPI: [name] is a 76year old male who returns for follow upappointmentfor chronic pain and medication refill. He states his pain is located in his lower back and left foot. He rates his pain 4. His current exercise regime is walking daily for 5 minutes daily and using statinary recumbent bicycle for 20 minutes. [name] was [facility] to [facility] on 09/15/2016 and discharged on 09/18/2016 for chest pain, had a stress test and prescribed Isosorbide.    Pain Inventory Average Pain 5 Pain Right Now 4 My pain is intermittent, sharp, burning, dull, stabbing, tingling and aching  In the last 24 hours, has pain interfered with the following? General activity 7 Relation with others 3 Enjoyment of life 5 What TIME of day is your pain at its worst? evening Sleep (in general) Poor  Pain is worse with: walking, bending, standing and some activites Pain improves with: rest, pacing activities, medication, TENS, injections and spinal cord stimulator Relief from Meds: 7  Mobility walk with assistance use a cane how many minutes can you walk? 5  ability to climb steps?  yes do you drive?  yes  Function retired I need assistance with the following:  household duties and shopping  Neuro/Psych weakness numbness tremor trouble walking dizziness  Prior Studies Any changes since last visit?  no  Physicians involved in your care Any changes since last visit?  no   Family History  Problem Relation Age of Onset  . Hypertension Mother   . Coronary artery disease Mother   . Heart attack Mother   . Neuropathy Mother   . Pulmonary fibrosis Father     asbestosis   Social History   Social History  . Marital status: Married    Spouse name: [name]  . Number of children: 0  . Years of education: 20   Occupational History  . engineering     retired   Social History Main  Topics  . Smoking status: Never Smoker  . Smokeless tobacco: Never Used  . Alcohol use No  . Drug use: No  . Sexual activity: Not Currently   Other Topics Concern  . None   Social History Narrative   HSG, [name] - BS, [name] - MS-engineering, 2 years on PhD - [name]. Married - '65 - 56yr/divorced; '76- 3 yrs/divorced; '92 . No children. 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 ACP/Living Will - Yes CPR; long-term Mechanical ventilation as long as he was able to cognate; ok for long term artificial nutrition. Precondition being able to cognate and not to have too much pain.    Past Surgical History:  Procedure Laterality Date  . AMPUTATION Left 04/11/2013   Procedure: AMPUTATION DIGIT Left 3rd toe;  Surgeon: [name] MD;  [place]  Service: Orthopedics;  Laterality: Left;  Left 3rd toe amputation at [facility]  . CARDIAC CATHETERIZATION  1997; 03/10/16   1997 Non-obstructive disease.  03/2016 BMS to RCA, with 25% pDiag dz, o/w normal cors per cath 03/07/16.  .Marland KitchenCARDIAC CATHETERIZATION  12/24/2012   mild < 20% LCx, prox 30% RCA; LVEF 55-65% , moderate pulmonary HTN, moderate AS  . CARDIOVASCULAR STRESS TEST  05/11/16 ([facility])   Myocardial perfusion imaging:  No ischemia; scar in apex, global hypokinesis, EF 36%.  . Carotid dopplers  03/09/2016   [facility]: no  hemodynamically significant stenosis on either side.  . CHOLECYSTECTOMY    . COLONOSCOPY    . COLONOSCOPY [name] 10/30/2012   Procedure: COLONOSCOPY;  Surgeon: [name], MD;  [facility];  Service: Endoscopy;  Laterality: [name];  . CORONARY ANGIOPLASTY WITH STENT PLACEMENT  03/2016   [facility]: BMS to RCA-pt was placed on Brilinta.  [name] EYE SURGERY Bilateral cataract  . HEMORRHOID SURGERY    . INTRAOCULAR LENS INSERTION Bilateral   . KNEE SURGERY Right   . LEFT AND RIGHT HEART CATHETERIZATION WITH CORONARY ANGIOGRAM [name] 12/24/2012   Procedure: LEFT AND RIGHT HEART CATHETERIZATION WITH  CORONARY ANGIOGRAM;  Surgeon: [name], MD;  Location[facility] LAB;  Service: Cardiovascular;  Laterality: [name];  . LEG SURGERY Bilateral    lenghtening   . PACEMAKER PLACEMENT  04/13/2016   2nd deg HB after TAVR, pt had DC MDT PPM placed.  [name] SHOULDER ARTHROSCOPY  08/30/2011   Procedure: ARTHROSCOPY SHOULDER;  Surgeon: [name], MD;  Location: [place];  Service: Orthopedics;  Laterality: Right;  Right Shoulder Arthroscopy, Debridement, and Decompression  . SPINAL CORD STIMULATOR INSERTION [name] 09/10/2015   Procedure: LUMBAR SPINAL CORD STIMULATOR INSERTION;  Surgeon: [name], MD;  Location: [place] NEURO ORS;  Service: Neurosurgery;  Laterality: [name];  . 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  R big toe surg due to osteoarth  . TONSILLECTOMY    . traeculectomy Left    eye  . TRANSCATHETER AORTIC VALVE REPLACEMENT, TRANSFEMORAL  04/11/2016  . TRANSESOPHAGEAL ECHOCARDIOGRAM  03/09/2016   [facility]: EF 55-60%, PFO seen with bi-directional shunting, no thrombus in appendage.  . TRANSTHORACIC ECHOCARDIOGRAM  01/2015; 01/2016; 05/18/16   01/2015 No signif change in aortic stenosis (moderate).  01/2016 Severe LVH w/small LV cavity, EF 60-65%, grade I diast dysfxn.  05/2016 (s/p TAVR): EF 50-55%, grd I DD, biopros AV good.  [name] VITRECTOMY     Past Medical History:  Diagnosis Date  . ASTHMA   . BPH (benign prostatic hypertrophy)   . CAD (coronary artery disease)    Nonobstructive by 12/2012 cath; then 03/2016 he required BMS to RCA ([facility])  . CATARACT, HX OF   . Chronic combined systolic and diastolic CHF (congestive heart failure) ([place]) 05/2016   Mild; [facility] 68[name]  . COPD   . Elevated transaminase level 02/2016   Suspect due to fatty liver (documented on u/s 2007). Plan repeat labs 03/2016.  .Marland KitchenEssential hypertension, benign   . Fatty liver 2007   u/s showed fatty liver with hepatosplenomegaly  . GERD (gastroesophageal reflux disease)   . Glaucoma    . GOUT   . Hallux valgus (acquired)   . HH (hiatus hernia)   . HYPERCHOLESTEROLEMIA-PURE   . Hypogonadism male   . IFG (impaired fasting glucose)    HbA1c jumped from 5.7% to 6.1% 03/2015---started metformin at that time.  . Lumbosacral neuritis   . Lumbosacral spondylosis    Lumbar spinal stenosis with neurogenic claudication--contributes to his chronic pain syndrome  . Normal memory function 08/2014   Neuropsychological testing ([facility]): no cognitive impairment or sign of neurodegenerative disorder.  Likely has adjustment d/o with mixed anxiety/depressed features and may benefit from low dose SNRI.    .Marland KitchenNormocytic anemia 03/2016   Mild-pt needs ferritin and vit B12 level checked (as of 03/22/16)  . NSTEMI (non-ST elevated myocardial infarction) ([place] 03/20/2016   BMS to RCA  . Obesity hypoventilation syndrome ([place]   .  Obesity, unspecified   . OSA on CPAP    8 cm H2O  . OSTEOARTHRITIS   . Osteoarthrosis, unspecified whether generalized or localized, unspecified site   . Paroxysmal atrial fibrillation ([place]) 2003    (? chronic?) Off anticoag for a while due to falls.  Then apixaban started 12/2014.  [name] Peripheral neuropathy ([place])    Heredetary; with chronic neuropathic pain (Dr. Ella Bodo)  . Personal history of colonic adenoma 10/30/2012   Diminutive adenoma, consider repeat 2019 per GI  . PUD (peptic ulcer disease)   . PULMONARY HYPERTENSION, HX OF   . Secondary male hypogonadism 2017  . 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 Shortness of breath    with exertion: much improved s/p TAVR and treatment for CHF.  [name] Unspecified glaucoma(365.9)   . Unspecified hereditary and idiopathic peripheral neuropathy approx age 83   bilat LE's, ? left arm, too.  Feet became progressively numb + left foot pain intermittently.  Pt may be trying a spinal stimulator (as of 05/2015)  . VENOUS INSUFFICIENCY   . VENTRAL  HERNIA    BP (!) 159/76 (BP Location: Left Wrist, Patient Position: Sitting, Cuff Size: Normal)   Pulse 67   Resp 14   SpO2 96%   Opioid Risk Score:   Fall Risk Score:  `1  Depression screen PHQ 2/9  Depression screen [facility] 2/9 09/23/2015 07/15/2015 03/16/2015 02/16/2015 09/16/2014  Decreased Interest 0 0 0 0 1  Down, Depressed, Hopeless 0 0 0 0 0  PHQ - 2 Score 0 0 0 0 1  Altered sleeping - - - - 2  Tired, decreased energy - - - - 2  Change in appetite - - - - 1  Feeling bad or failure about yourself  - - - - 0  Trouble concentrating - - - - 0  Moving slowly or fidgety/restless - - - - 0  Suicidal thoughts - - - - 0  PHQ-9 Score - - - - 6  Some recent data might be hidden   2 Review of Systems  HENT: Negative.   Eyes: Negative.   Respiratory: Negative.   Cardiovascular: Negative.   Gastrointestinal: Negative.   Endocrine: Negative.   Genitourinary: Negative.   Musculoskeletal: Positive for back pain and gait problem.  Skin: Negative.   Allergic/Immunologic: Negative.   Neurological: Positive for dizziness, tremors, weakness and numbness.  Hematological: Negative.   Psychiatric/Behavioral: Negative.   All other systems reviewed and are negative.      Objective:   Physical Exam  Constitutional: He is oriented to person, place, and time. He appears well-developed and well-nourished.  HENT:  Head: Normocephalic and atraumatic.  Neck: Normal range of motion. Neck supple.  Cardiovascular: Normal rate and regular rhythm.   Pulmonary/Chest: Effort normal.  Musculoskeletal:  Normal Muscle Bulk and Muscle Testing Reveals: Upper Extremities: Right: Decreased ROM 45 Degrees and Muscle Strength 5/5 Left: Full ROM and Muscle Strength 5/5 Thoracic Paraspinal: T-3-T-5 Lower Extremities: Full ROM and Muscle Strength 5/5 Arises from chair slowly uses straight cane for support Antalgic Gait   Neurological: He is alert and oriented to person, place, and time.  Skin: Skin is warm  and dry.  Psychiatric: He has a normal mood and affect.  Nursing note and vitals reviewed.         Assessment & Plan:  1. Hereditary peripheral neuropathy with chronic neuropathic pain: Using Tens Unit:  Continue to Monitor. Continue Gabapentin. O3/27/2018 Refilled:Oxycodone 10 mg one tablet QID#120.  We will continue the opioid monitoring program, this consists of regular clinic visits, examinations, urine drug screen, pill counts as well as use of [place] Controlled Substance Reporting System. 2. DDD lumbar spine with LBP: Neurology Following. Continue with activity and heat therapy. 09/26/2016 S/P Permanent Spinal Stimulator Placed 09/10/15 via Dr. Maryjean Ka, with improvement of pain. 09/26/2016  20 minutes of face to face patient care time was spent during this visit. All questions were encouraged and answered.  F/U in 1 month

## 2016-09-29 ENCOUNTER — Other Ambulatory Visit: Payer: Self-pay | Admitting: Family Medicine

## 2016-09-29 ENCOUNTER — Other Ambulatory Visit: Payer: Self-pay | Admitting: Cardiology

## 2016-09-29 DIAGNOSIS — I4891 Unspecified atrial fibrillation: Secondary | ICD-10-CM

## 2016-10-04 ENCOUNTER — Ambulatory Visit (INDEPENDENT_AMBULATORY_CARE_PROVIDER_SITE_OTHER): Payer: Medicare Other | Admitting: Neurology

## 2016-10-04 ENCOUNTER — Encounter: Payer: Self-pay | Admitting: Neurology

## 2016-10-04 VITALS — BP 140/78 | HR 86 | Ht 71.0 in | Wt 325.2 lb

## 2016-10-04 DIAGNOSIS — R42 Dizziness and giddiness: Secondary | ICD-10-CM | POA: Diagnosis not present

## 2016-10-04 NOTE — Patient Instructions (Addendum)
Please continue to use compression stockings Discuss with your cardiologist about management options for orthostasis as we have to carefully balance which medications can be used with heart failure

## 2016-10-04 NOTE — Progress Notes (Signed)
Follow-up Visit   Date: 10/04/16    Tim [name] MRN: [id number] DOB: May 11, 1941   Interim History: Tim [name] is a 76 y.o. right-handed Caucasian male with history of paroxsymal atrial fibrillation (anticoagulation stopped in due to falls), moderate aortic stenosis, OSA on CPAP, IBS, CAD, hyperlipidemia, hypertension, chronic low back pain and idiopathic peripheral neuropathy returning for evaluation of episodic lightheadedness.  He was last seen in January 2016 for proximal leg weakness, neuropathy, and memory problems.   History of present illness: Starting around early 2014, he started having fall which initially only occurred about four times per year but started falling monthly over the past 67-month. He noticed that he was unable to stand or walk as much as he was previously able to. He is limited by doing additional walking or standing because of a combination of lightheadedness, shortness of breath, and proximal leg weakness. Symptoms have slowly been worsening especially over the past 2 months. He has 4 steps to get into his house and has difficulty even climbing this.   Also during the past 667-month he has been having problems with low blood pressure and his medications are being tapered. He was transferred to the emergency department from his appointment with Dr. KiDianna Limboor chronic pain because his blood pressure was low. His PCP and cardiologist has been reducing his blood pressure medications (reduced toprol and stopped cozaar and lasix). Lightheadedness is worse when making positional changes and lasts 72m35m- 61m75mnd occurs every other day.   He has had peripheral neuropathy for the past 25 years, since early 1990. He has burning pain and numbness of the which started in the toes and gradually involved the level of the knees and over the past 5 years, it has also involved his hands. He stopped playing the clarinet because of inability to feel it. He  has tried a number of medications and now is seeing Pain Management. Currently, he takes neurontin 600mg56m, oxyocodone, and ultram. Amitriptyline was recently stopped due to concern of contributing to his blood pressure. 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 Of not, he has a long history of chronic low back pain and has received epidural injections with no lasting benefit and has been evaluated by neurosurgery for lumbar spondylosis and foraminal stenosis in the past but was not a surgical candidate.   UPDATE 07/14/2014:  He was seen by Dr. PooleTrenton Gammonhe did not feel that he surgery was indicated.  His primary issue is his left foot pain is getting worse.  About 2-weeks ago, he had a spell of no pain at all.  Since then, he is taking more oxyocodone and unable to sleep at night.  His pain is sudden, sharp, and lancinating.  It is very brief lasting one a few seconds, but will reoccur.  Standing and sitting upright helps.  He has noticed that he is able to walk farther without getting quadriceps soreness.    He is also complaining of short-term memory problems, word-finding problems, and processing has been more difficulty.  He not having difficulty with driving, no MVA, and no financial problems.  UPDATE 10/04/2016:  He is here today because of complaints of lightheadedness which started in September 2017.  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 Since this time, his lightheadedness and  chest pain improved.  However, about a 1-2 months ago, he again started having lightheadedness and exertional dyspnea.  His torsemide was reduced to 60m which helped some of his lightheadedness.  Symptoms are similar to what he experienced in 2014 which was attributed to orthostatic hypotension and improved with medication changes.   He also has  spinal cord stimulator in 207 which has helped his neuropathic pain.  He denies any significant worsening of neuropathy or leg weakness.  He is able to was unassisted and uses a cane only as needed.    Medications:  Current Outpatient Prescriptions on File Prior to Visit  Medication Sig Dispense Refill  . albuterol (PROVENTIL HFA;VENTOLIN HFA) 108 (90 BASE) MCG/ACT inhaler Inhale 2 puffs into the lungs 4 (four) times daily as needed for wheezing or shortness of breath.    . allopurinol (ZYLOPRIM) 300 MG tablet take 1 tablet by mouth once daily with food 30 tablet 11  . apixaban (ELIQUIS) 5 MG TABS tablet Take 1 tablet by mouth twice daily, need MD appt for further refills 180 tablet 0  . aspirin EC 81 MG tablet Take by mouth.    . B Complex-C (B-COMPLEX WITH VITAMIN C) tablet Take 1 tablet by mouth daily.    . cadexomer iodine (IODOSORB) 0.9 % gel Apply 1 application topically daily as needed for wound care. 10 g 3  . clotrimazole-betamethasone (LOTRISONE) cream apply to affected area twice a day 45 g 5  . ezetimibe (ZETIA) 10 MG tablet Take 1 tablet (10 mg total) by mouth daily. 90 tablet 1  . finasteride (PROSCAR) 5 MG tablet take 1 tablet by mouth once daily 90 tablet 1  . fluticasone (FLONASE) 50 MCG/ACT nasal spray Place 2 sprays into both nostrils as needed for allergies or rhinitis.    . Fluticasone-Salmeterol (ADVAIR) 250-50 MCG/DOSE AEPB Inhale 1 puff into the lungs daily as needed (for nasal congestion).     . gabapentin (NEURONTIN) 600 MG tablet take 1 tablet by mouth four times a day 120 tablet 3  . metFORMIN (GLUCOPHAGE) 500 MG tablet take 1 tablet by mouth twice a day with food 60 tablet 6  . metoprolol succinate (TOPROL-XL) 25 MG 24 hr tablet Take 1 tablet (25 mg total) by mouth daily. 90 tablet 1  . metoprolol succinate (TOPROL-XL) 50 MG 24 hr tablet take 1 tablet by mouth once daily ALONG WITH 25 MG TO EQUAL 75MG 30 tablet 6  . Multiple Vitamin (MULTIVITAMIN) capsule Take 1  capsule by mouth daily.    . Niacin CR 1000 MG TBCR Take 1 tablet (1,000 mg total) by mouth 2 (two) times daily. 180 each 3  . omeprazole (PRILOSEC) 10 MG capsule take 1 capsule by mouth once daily 30 capsule 11  . Oxycodone HCl 10 MG TABS Take 1 tablet (10 mg total) by mouth every 6 (six) hours. 120 tablet 0  . potassium chloride SA (K-DUR,KLOR-CON) 20 MEQ tablet take 1 tablet by mouth once daily 90 tablet 1  . rosuvastatin (CRESTOR) 40 MG tablet Take 1 tablet (40 mg total) by mouth daily. 90 tablet 0  . torsemide (DEMADEX) 20 MG tablet take 2 tablets by mouth once daily 60 tablet 3  . traMADol-acetaminophen (ULTRACET) 37.5-325 MG tablet Take 1 tablet by mouth 4 (four) times daily as needed. (Patient taking differently: Take 1 tablet by mouth 2 (two) times daily. ) 120 tablet 5  . VITAMIN D, CHOLECALCIFEROL, PO Take 1 tablet by mouth daily.  No current facility-administered medications on file prior to visit.     Allergies:  Allergies  Allergen Reactions  . Brimonidine Tartrate Shortness Of Breath    Alphagan-Shortness of breath  . Brimonidine Tartrate Palpitations  . Brinzolamide Shortness Of Breath    AZOPT- Shortness of breath  . Latanoprost Shortness Of Breath    XALATAN- Shortness of breath  . Nucynta [Tapentadol] Shortness Of Breath  . Sulfa Antibiotics Palpitations  . Timolol Maleate Shortness Of Breath and Other (See Comments)    TIMOPTIC- Aggravated asthma  . Cardizem  [Diltiazem Hcl] Swelling  . Diltiazem Swelling     leg swelling  . Rofecoxib Swelling     VIOXX- leg swelling  . Vancomycin Hives and Other (See Comments)    Possible "Red Man Syndrome"? > hives/blisters  . Codeine Other (See Comments)    Childhood reaction  . Celecoxib Other (See Comments)    CELLBREX-confusion  . Colchicine Diarrhea    diarrhea  . Tape Rash    Review of Systems:  CONSTITUTIONAL: No fevers, chills, night sweats, or weight loss.  EYES: No visual changes or eye pain ENT: No  hearing changes.  No history of nose bleeds.   RESPIRATORY: No cough, wheezing and shortness of breath.   CARDIOVASCULAR: Negative for chest pain, and palpitations.   GI: Negative for abdominal discomfort, blood in stools or black stools.  No recent change in bowel habits.   GU:  No history of incontinence.   MUSCLOSKELETAL: +history of joint pain or swelling.  No myalgias.   SKIN: Negative for lesions, rash, and itching.   ENDOCRINE: Negative for cold or heat intolerance, polydipsia or goiter.   PSYCH:  No depression or anxiety symptoms.   NEURO: As Above.   Vital Signs:  BP 140/78   Pulse 86   Ht 5' 11"  (1.803 m)   Wt (!) 325 lb 3 oz (147.5 kg)   BMI 45.35 kg/m  Orthostatic VS for the past 24 hrs:  BP- Lying Pulse- Lying BP- Sitting Pulse- Sitting BP- Standing at 0 minutes Pulse- Standing at 0 minutes  10/04/16 1154 150/80 83 140/78 86 130/76 93   CRANIAL NERVES:   Pupils equal round and reactive to light.  Normal conjugate, extra-ocular eye movements in all directions of gaze.  Left ptosis (old).  Face is symmetric. Palate elevates symmetrically.  Tongue is midline.  MOTOR:  Motor strength is 5/5 in all extremities.   Tone is normal.    MSRs:  Reflexes are 2+/4 in the upper extremities, absent reflexes in the legs.  SENSORY:  Sensation is absent to vibration distal to knees bilaterally. Romberg's sign present.   COORDINATION/GAIT:  Gait is wide-based due to body habitus, unassisted and stable.    Data: MRI lumbar spine 01/31/2014; 1. Mild left subarticular and foraminal narrowing at L2-3 is similar to the prior study.  2. Progressive mild to moderate so reticular and foraminal stenosis bilaterally at L3-4 has progressed, left greater than right.  3. Progressive subarticular stenosis at L4-5 with similar appearance of foraminal narrowing bilaterally. Stenosis is worse on the right.  4. Minimal right subarticular and foraminal narrowing on the right at L5-S1.   Lab Results    Component Value Date   TSH 1.67 02/09/2016   Lab Results  Component Value Date   HGBA1C 6.1 08/25/2016   SPEP/UPEP with IFE 06/02/2014:  +monoclonal protein, IgA lambda Las 04/02/2014:  MG panel - negative  IMPRESSION: 1. Orthostatic hypotension causing lightheadedness.  There was  a 20 point drop in SBP on his orthostatic vital signs. Symptoms are most likely medication-induced and less likely due dysautonomia especially as there is a normal compensatory increase in heart rate. With his cardiac comorbidities, I would like him to see his cardiologist to determine what medication options are safest.  This is complex case as florinef would cause volume retention and is not recommended with CHF and midodrine has the risk of supine hypertension. Mestinon is a safer, but often less effective, alternative. If this symptoms can be controlled with adjusting his current medications, we may be able to avoid treating his orthostasis with medications all together.   In the meantime, I encouraged him to be compliant with his compression stockings and to make positional changes slowly.   2.  Multilevel lumbosacral foraminal stenosis - stable.  He is not a surgical candidate and clinically does not show evidence of weakness.  3. Idiopathic peripheral neuropathy, moderately severe conforming to length-dependent pattern, followed by pain management. s/p spinal cord stimulator.    The duration of this appointment visit was 25 minutes of face-to-face time with the patient.  Greater than 50% of this time was spent in counseling, explanation of diagnosis, planning of further management, and coordination of care.   Thank you for allowing me to participate in patient's care.  If I can answer any additional questions, I would be pleased to do so.    Sincerely,    Collyn Selk K. Posey Pronto, DO

## 2016-10-09 ENCOUNTER — Encounter (INDEPENDENT_AMBULATORY_CARE_PROVIDER_SITE_OTHER): Payer: Self-pay | Admitting: Orthopedic Surgery

## 2016-10-09 ENCOUNTER — Ambulatory Visit (INDEPENDENT_AMBULATORY_CARE_PROVIDER_SITE_OTHER): Payer: Medicare Other | Admitting: Orthopedic Surgery

## 2016-10-09 DIAGNOSIS — I87331 Chronic venous hypertension (idiopathic) with ulcer and inflammation of right lower extremity: Secondary | ICD-10-CM | POA: Diagnosis not present

## 2016-10-09 DIAGNOSIS — L97919 Non-pressure chronic ulcer of unspecified part of right lower leg with unspecified severity: Principal | ICD-10-CM

## 2016-10-09 MED ORDER — MUPIROCIN 2 % EX OINT: 1.0000 "application " | TOPICAL_OINTMENT | Freq: Two times a day (BID) | CUTANEOUS | 3 refills | Status: DC

## 2016-10-09 NOTE — Progress Notes (Signed)
Office Visit Note   Patient: Tim [name]           Date of Birth: 04-06-1941           MRN: [id number] Visit Date: 10/09/2016              Requested by: Tammi Sou, MD [id number] [place], [place] [address] PCP: Tammi Sou, MD  Chief Complaint  Patient presents with  . Right Leg - Follow-up      HPI: Patient is 28 showed gentleman with venous stasis insufficiency ulceration right lower extremity. Patient stop wearing the CircAid compression wrap he states that even on the looses setting it was uncomfortable proximally. Patient is been using Bactroban but states that he has run out.  Assessment & Plan: Visit Diagnoses:  1. 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   Ortho Exam  [name], oriented, no adenopathy, well-dressed, normal affect, normal respiratory effort. Patient has an antalgic gait. Examination he has brawny skin color changes to the entire right lower extremity pitting edema up to the tibial tubercle he has to venous stasis ulcers anteriorly that are possibly 10 mm in diameter 1 mm deep there is good healthy granulation tissue no cellulitis no odor no drainage no signs of infection.  Imaging: No results found.  Labs: Lab Results  Component Value Date   HGBA1C 6.1 08/25/2016   HGBA1C 5.4 05/24/2016   HGBA1C 5.4 12/14/2015   ESRSEDRATE 20 02/09/2016   ESRSEDRATE 36 (H) 01/28/2014   LABURIC 3.5 (L) 09/10/2013   LABORGA NO GROWTH 08/25/2016    Orders:  No orders of the defined types were placed in this encounter.  Meds ordered this encounter  Medications  . mupirocin ointment (BACTROBAN) 2 %    Sig: Apply 1 application topically 2 (two) times daily. Apply to the affected area 2 times a day   Dispense:  22 g    Refill:  3     Procedures: No procedures performed  Clinical Data: No additional findings.  ROS:  All other systems negative, except as noted in the HPI. Review of Systems  Objective: Vital Signs: There were no vitals taken for this visit.  Specialty Comments:  No specialty comments available.  PMFS History: Patient Active Problem List   Diagnosis Date Noted  . Neuropathic pain of foot, left 06/20/2016  . 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 Status post amputation of left great toe ([place]) 06/08/2015  . Spinal stenosis of lumbar region 02/16/2015  . Hereditary and idiopathic peripheral neuropathy 01/12/2015  . Oral thrush 12/28/2014  . Postural dizziness 05/24/2014  . Paresthesias with subjective weakness 01/30/2014  . Bilateral thigh pain 01/30/2014  . Abdominal wall pain in right upper quadrant 01/22/2014  . Gross hematuria 09/25/2013  . Intertrigo 09/25/2013  . IBS (irritable bowel syndrome) 09/11/2013  . Chronic pain syndrome 08/06/2013  . CAD (coronary artery disease), native coronary artery 12/25/2012  . Paroxysmal atrial fibrillation ([place]) 12/25/2012  . Moderate aortic stenosis 12/25/2012  . Aortic stenosis 12/24/2012  . Personal history of colonic adenoma 10/30/2012  . Diverticulosis of colon (without mention of hemorrhage) 10/30/2012  . Internal hemorrhoids 10/30/2012  . Change in bowel habits intermittent loose stools 10/30/2012  . Routine health maintenance 06/09/2012  .  Hereditary peripheral neuropathy 10/10/2011  . Long term current use of anticoagulant - apixiban 08/16/2010  . Dyspnea on exertion 09/08/2009  . HYPERCHOLESTEROLEMIA-PURE 01/13/2009  . Essential hypertension, benign 01/13/2009  . EDEMA 01/13/2009  . GOUT 01/12/2009  . GLAUCOMA 01/12/2009  . Venous (peripheral) insufficiency 01/12/2009  . COPD (chronic obstructive pulmonary disease) ([place]) 01/12/2009  . VENTRAL HERNIA 01/12/2009  . OSTEOARTHRITIS 01/12/2009  . ARTHRITIS  01/12/2009  . DEGENERATIVE DISC DISEASE 01/12/2009  . CATARACT, HX OF 01/12/2009  . HEART MURMUR, HX OF 01/12/2009  . BENIGN PROSTATIC HYPERTROPHY, HX OF 01/12/2009  . Obesity 01/17/2007  . OBSTRUCTIVE SLEEP APNEA 01/17/2007  . ASTHMA 01/17/2007  . GERD 01/17/2007  . HALLUX VALGUS, ACQUIRED 01/17/2007  . PULMONARY HYPERTENSION, HX OF 01/17/2007   Past Medical History:  Diagnosis Date  . ASTHMA   . BPH (benign prostatic hypertrophy)   . CAD (coronary artery disease)    Nonobstructive by 12/2012 cath; then 03/2016 he required BMS to RCA ([facility])  . CATARACT, HX OF   . Chronic combined systolic and diastolic CHF (congestive heart failure) ([place]) 05/2016   Mild; [facility]  . COPD   . Elevated transaminase level 02/2016   Suspect due to fatty liver (documented on u/s 2007). Plan repeat labs 03/2016.  .Marland KitchenEssential hypertension, benign   . Fatty liver 2007   u/s showed fatty liver with hepatosplenomegaly  . GERD (gastroesophageal reflux disease)   . Glaucoma   . GOUT   . Hallux valgus (acquired)   . HH (hiatus hernia)   . HYPERCHOLESTEROLEMIA-PURE   . Hypogonadism male   . IFG (impaired fasting glucose)    HbA1c jumped from 5.7% to 6.1% 03/2015---started metformin at that time.  . Lumbosacral neuritis   . Lumbosacral spondylosis    Lumbar spinal stenosis with neurogenic claudication--contributes to his chronic pain syndrome  . Normal memory function 08/2014   Neuropsychological testing ([facility]): no cognitive impairment or sign of neurodegenerative disorder.  Likely has adjustment d/o with mixed anxiety/depressed features and may benefit from low dose SNRI.    .Marland KitchenNormocytic anemia 03/2016   Mild-pt needs ferritin and vit B12 level checked (as of 03/22/16)  . NSTEMI (non-ST elevated myocardial infarction) ([place] 03/20/2016   BMS to RCA  . Obesity hypoventilation syndrome ([place]   . Obesity, unspecified   . Orthostatic  hypotension   . OSA on CPAP    8 cm H2O  . OSTEOARTHRITIS   . Osteoarthrosis, unspecified whether generalized or localized, unspecified site   . Paroxysmal atrial fibrillation ([place] 2003    (? chronic?) Off anticoag for a while due to falls.  Then apixaban started 12/2014.  .Marland KitchenPeripheral neuropathy ([place]    [place]red[place]; with chronic neuropathic pain (Dr. KElla Bodo  . Personal history of colonic adenoma 10/30/2012   Diminutive adenoma, consider repeat 2019 per GI  . PUD (peptic ulcer disease)   . PULMONARY HYPERTENSION, HX OF   . Secondary male hypogonadism 2017  . 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 Shortness of breath    with exertion: much improved s/p TAVR and treatment for CHF.  .Marland KitchenUnspecified glaucoma(365.9)   . Unspecified hereditary and idiopathic peripheral neuropathy approx age 89  bilat LE's, ? left arm, too.  Feet became progressively numb + left foot pain intermittently.  Pt may be trying a spinal stimulator (as of 05/2015)  .  VENOUS INSUFFICIENCY   . VENTRAL HERNIA     Family History  Problem Relation Age of Onset  . Hypertension Mother   . Coronary artery disease Mother   . Heart attack Mother   . Neuropathy Mother   . Pulmonary fibrosis Father     asbestosis    Past Surgical History:  Procedure Laterality Date  . AMPUTATION Left 04/11/2013   Procedure: AMPUTATION DIGIT Left 3rd toe;  Surgeon: [name], MD;  Location: [place];  Service: Orthopedics;  Laterality: Left;  Left 3rd toe amputation at [facility]  . CARDIAC CATHETERIZATION  1997; 03/10/16   1997 Non-obstructive disease.  03/2016 BMS to RCA, with 25% pDiag dz, o/w normal cors per cath 03/07/16.  [name] CARDIAC CATHETERIZATION  12/24/2012   mild < 20% LCx, prox 30% RCA; LVEF 55-65% , moderate pulmonary HTN, moderate AS  . CARDIOVASCULAR STRESS TEST  05/11/16 ([facility])   Myocardial perfusion imaging:  No ischemia; scar in apex, global hypokinesis, EF 36%.    . Carotid dopplers  03/09/2016   [facility]: no hemodynamically significant stenosis on either side.  . CHOLECYSTECTOMY    . COLONOSCOPY    . COLONOSCOPY [name] 10/30/2012   Procedure: COLONOSCOPY;  Surgeon: [name], MD;  Location: [facility];  Service: Endoscopy;  Laterality: [name];  . CORONARY ANGIOPLASTY WITH STENT PLACEMENT  03/2016   [facility]: BMS to RCA-pt was placed on Brilinta.  [name] EYE SURGERY Bilateral cataract  . HEMORRHOID SURGERY    . INTRAOCULAR LENS INSERTION Bilateral   . KNEE SURGERY Right   . LEFT AND RIGHT HEART CATHETERIZATION WITH CORONARY ANGIOGRAM [name] 12/24/2012   Procedure: LEFT AND RIGHT HEART CATHETERIZATION WITH CORONARY ANGIOGRAM;  Surgeon: [name], MD;  Location[facility] CATH LAB;  Service: Cardiovascular;  Laterality: [name];  . LEG SURGERY Bilateral    lenghtening   . PACEMAKER PLACEMENT  04/13/2016   2nd deg HB after TAVR, pt had DC MDT PPM placed.  [name] SHOULDER ARTHROSCOPY  08/30/2011   Procedure: ARTHROSCOPY SHOULDER;  Surgeon: [name], MD;  Location: [facility];  Service: Orthopedics;  Laterality: Right;  Right Shoulder Arthroscopy, Debridement, and Decompression  . SPINAL CORD STIMULATOR INSERTION [name] 09/10/2015   Procedure: LUMBAR SPINAL CORD STIMULATOR INSERTION;  Surgeon: [name], MD;  Location: [place] NEURO ORS;  Service: Neurosurgery;  Laterality: [name];  . 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  R big toe surg due to osteoarth  . TONSILLECTOMY    . traeculectomy Left    eye  . TRANSCATHETER AORTIC VALVE REPLACEMENT, TRANSFEMORAL  04/11/2016  . TRANSESOPHAGEAL ECHOCARDIOGRAM  03/09/2016   [facility]: EF 55-60%, PFO seen with bi-directional shunting, no thrombus in appendage.  . TRANSTHORACIC ECHOCARDIOGRAM  01/2015; 01/2016; 05/18/16   01/2015 No signif change in aortic stenosis (moderate).  01/2016 Severe LVH w/small LV cavity, EF 60-65%, grade I diast dysfxn.  05/2016 (s/p TAVR): EF 50-55%, grd I DD, biopros AV good.  [name] VITRECTOMY     Social History    Occupational History  . engineering     retired   Social History Main Topics  . Smoking status: Never Smoker  . Smokeless tobacco: Never Used  . Alcohol use No  . Drug use: No  . Sexual activity: Not Currently

## 2016-10-11 ENCOUNTER — Encounter: Payer: Self-pay | Admitting: Family Medicine

## 2016-10-12 ENCOUNTER — Other Ambulatory Visit: Payer: Self-pay | Admitting: Cardiology

## 2016-10-12 DIAGNOSIS — I4891 Unspecified atrial fibrillation: Secondary | ICD-10-CM

## 2016-10-17 ENCOUNTER — Ambulatory Visit (INDEPENDENT_AMBULATORY_CARE_PROVIDER_SITE_OTHER): Payer: Medicare Other | Admitting: Orthopedic Surgery

## 2016-10-17 ENCOUNTER — Encounter (INDEPENDENT_AMBULATORY_CARE_PROVIDER_SITE_OTHER): Payer: Self-pay | Admitting: Orthopedic Surgery

## 2016-10-17 VITALS — Ht 71.0 in | Wt 325.0 lb

## 2016-10-17 DIAGNOSIS — I87331 Chronic venous hypertension (idiopathic) with ulcer and inflammation of right lower extremity: Secondary | ICD-10-CM | POA: Diagnosis not present

## 2016-10-17 DIAGNOSIS — L97919 Non-pressure chronic ulcer of unspecified part of right lower leg with unspecified severity: Principal | ICD-10-CM

## 2016-10-17 NOTE — Progress Notes (Signed)
Office Visit Note   Patient: Tim [name]           Date of Birth: 08-17-1940           MRN: [id number] Visit Date: 10/17/2016              Requested by: Tammi Sou, MD [address] [place], [place] [address] PCP: Tammi Sou, MD  Chief Complaint  Patient presents with  . Right Leg - Follow-up      HPI: Patient is a 76 year old gentleman with venous stasis insufficiency right lower extremity with ulceration and dermatitis. Patient has been using a CircAid compression wrap. Patient presents complaining of increasing discoloration and bruising in his leg. Patient denies any fever or chills denies an odor  Assessment & Plan: Visit Diagnoses:  1. 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 Patient will continue with elevation follow-up on Monday to evaluate for placing him back in the compression garment.  Follow-Up Instructions: Return in about 1 week (around 10/24/2016).   Ortho Exam  [name], oriented, no adenopathy, well-dressed, normal affect, normal respiratory effort. Patient has an antalgic gait. Examination patient does have venous stasis swelling in the right lower extremity. The ulcers are superficial there is significant amount of brawny skin color changes and appears to be a subdermal small hematoma. The skin is slightly fluctuant there is no pain to palpation there is no cellulitis. The calf is nontender to palpation no evidence of a DVT dorsiflexion of the ankle is nonpainful. There is no odor no drainage.  Imaging: No results found.  Labs: Lab Results  Component Value Date   HGBA1C 6.1 08/25/2016   HGBA1C 5.4 05/24/2016   HGBA1C 5.4 12/14/2015   ESRSEDRATE 20 02/09/2016   ESRSEDRATE 36 (H) 01/28/2014   LABURIC 3.5 (L) 09/10/2013   LABORGA NO GROWTH 08/25/2016    Orders:  No orders of the defined types were placed in this  encounter.  No orders of the defined types were placed in this encounter.    Procedures: No procedures performed  Clinical Data: No additional findings.  ROS:  All other systems negative, except as noted in the HPI. Review of Systems  Objective: Vital Signs: Ht 5' 11"  (1.803 m)   Wt (!) 325 lb (147.4 kg)   BMI 45.33 kg/m   Specialty Comments:  No specialty comments available.  PMFS History: Patient Active Problem List   Diagnosis Date Noted  . Neuropathic pain of foot, left 06/20/2016  . 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 Status post amputation of left great toe ([place]) 06/08/2015  . Spinal stenosis of lumbar region 02/16/2015  . Hereditary and idiopathic peripheral neuropathy 01/12/2015  . Oral thrush 12/28/2014  . Postural dizziness 05/24/2014  . Paresthesias with subjective weakness 01/30/2014  . Bilateral thigh pain 01/30/2014  . Abdominal wall pain in right upper quadrant 01/22/2014  . Gross hematuria 09/25/2013  . Intertrigo 09/25/2013  . IBS (irritable bowel syndrome) 09/11/2013  . Chronic pain syndrome 08/06/2013  . CAD (coronary artery disease), native coronary artery 12/25/2012  . Paroxysmal atrial fibrillation ([place]) 12/25/2012  . Moderate aortic stenosis 12/25/2012  . Aortic stenosis 12/24/2012  . Personal history of colonic adenoma 10/30/2012  . Diverticulosis of colon (without mention of hemorrhage) 10/30/2012  . Internal hemorrhoids 10/30/2012  . Change in bowel habits intermittent loose stools 10/30/2012  . Routine health maintenance  06/09/2012  . Hereditary peripheral neuropathy 10/10/2011  . Long term current use of anticoagulant - apixiban 08/16/2010  . Dyspnea on exertion 09/08/2009  . HYPERCHOLESTEROLEMIA-PURE 01/13/2009  . Essential hypertension, benign 01/13/2009  . EDEMA 01/13/2009  . GOUT 01/12/2009  . GLAUCOMA 01/12/2009  . Venous (peripheral) insufficiency 01/12/2009  . COPD (chronic obstructive pulmonary disease) ([place]) 01/12/2009  .  VENTRAL HERNIA 01/12/2009  . OSTEOARTHRITIS 01/12/2009  . ARTHRITIS 01/12/2009  . DEGENERATIVE DISC DISEASE 01/12/2009  . CATARACT, HX OF 01/12/2009  . HEART MURMUR, HX OF 01/12/2009  . BENIGN PROSTATIC HYPERTROPHY, HX OF 01/12/2009  . Obesity 01/17/2007  . OBSTRUCTIVE SLEEP APNEA 01/17/2007  . ASTHMA 01/17/2007  . GERD 01/17/2007  . HALLUX VALGUS, ACQUIRED 01/17/2007  . PULMONARY HYPERTENSION, HX OF 01/17/2007   Past Medical History:  Diagnosis Date  . ASTHMA   . BPH (benign prostatic hypertrophy)   . CAD (coronary artery disease)    Nonobstructive by 12/2012 cath; then 03/2016 he required BMS to RCA ([facility])  . CATARACT, HX OF   . Chronic combined systolic and diastolic CHF (congestive heart failure) ([place]) 05/2016   Mild; [facility]  . COPD   . Elevated transaminase level 02/2016   Suspect due to fatty liver (documented on u/s 2007). Plan repeat labs 03/2016.  .Marland KitchenEssential hypertension, benign   . Fatty liver 2007   u/s showed fatty liver with hepatosplenomegaly  . GERD (gastroesophageal reflux disease)   . Glaucoma   . GOUT   . Hallux valgus (acquired)   . HH (hiatus hernia)   . HYPERCHOLESTEROLEMIA-PURE   . Hypogonadism male   . IFG (impaired fasting glucose)    HbA1c jumped from 5.7% to 6.1% 03/2015---started metformin at that time.  . Lumbosacral neuritis   . Lumbosacral spondylosis    Lumbar spinal stenosis with neurogenic claudication--contributes to his chronic pain syndrome  . Normal memory function 08/2014   Neuropsychological testing ([facility]): no cognitive impairment or sign of neurodegenerative disorder.  Likely has adjustment d/o with mixed anxiety/depressed features and may benefit from low dose SNRI.    .Marland KitchenNormocytic anemia 03/2016   Mild-pt needs ferritin and vit B12 level checked (as of 03/22/16)  . NSTEMI (non-ST elevated myocardial infarction) ([place] 03/20/2016   BMS to RCA  . Obesity  hypoventilation syndrome ([place]   . Obesity, unspecified   . Orthostatic hypotension   . OSA on CPAP    8 cm H2O  . OSTEOARTHRITIS   . Osteoarthrosis, unspecified whether generalized or localized, unspecified site   . Paroxysmal atrial fibrillation ([place] 2003    (? chronic?) Off anticoag for a while due to falls.  Then apixaban started 12/2014.  .Marland KitchenPeripheral neuropathy    Heredetary; with chronic neuropathic pain (Dr. KElla Bodo  . Personal history of colonic adenoma 10/30/2012   Diminutive adenoma, consider repeat 2019 per GI  . PUD (peptic ulcer disease)   . PULMONARY HYPERTENSION, HX OF   . Secondary male hypogonadism 2017  . 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 Shortness of breath    with exertion: much improved s/p TAVR and treatment for CHF.  .Marland KitchenUnspecified glaucoma(365.9)   . Unspecified hereditary and idiopathic peripheral neuropathy approx age 76  bilat LE's, ? left arm, too.  Feet became progressively numb + left foot pain intermittently.  Pt may be trying a spinal stimulator (as of  05/2015)  . VENOUS INSUFFICIENCY    Being followed by Dr. Sharol Given as of 10/2016 for two R LL venous stasis ulcers  . VENTRAL HERNIA     Family History  Problem Relation Age of Onset  . Hypertension Mother   . Coronary artery disease Mother   . Heart attack Mother   . Neuropathy Mother   . Pulmonary fibrosis Father     asbestosis    Past Surgical History:  Procedure Laterality Date  . AMPUTATION Left 04/11/2013   Procedure: AMPUTATION DIGIT Left 3rd toe;  Surgeon: [name], MD;  Location: [facility];  Service: Orthopedics;  Laterality: Left;  Left 3rd toe amputation at [facility]  . CARDIAC CATHETERIZATION  1997; 03/10/16   1997 Non-obstructive disease.  03/2016 BMS to RCA, with 25% pDiag dz, o/w normal cors per cath 03/07/16.  [name] CARDIAC CATHETERIZATION  12/24/2012   mild < 20% LCx, prox 30% RCA; LVEF 55-65% , moderate pulmonary HTN,  moderate AS  . CARDIOVASCULAR STRESS TEST  05/11/16 ([facility])   Myocardial perfusion imaging:  No ischemia; scar in apex, global hypokinesis, EF 36%.  . Carotid dopplers  03/09/2016   [facility]: no hemodynamically significant stenosis on either side.  . CHOLECYSTECTOMY    . COLONOSCOPY    . COLONOSCOPY [name] 10/30/2012   Procedure: COLONOSCOPY;  Surgeon: [name], MD;  [facility];  Service: Endoscopy;  Laterality: [name];  . CORONARY ANGIOPLASTY WITH STENT PLACEMENT  03/2016   [facility]: BMS to RCA-pt was placed on Brilinta.  [name] EYE SURGERY Bilateral cataract  . HEMORRHOID SURGERY    . INTRAOCULAR LENS INSERTION Bilateral   . KNEE SURGERY Right   . LEFT AND RIGHT HEART CATHETERIZATION WITH CORONARY ANGIOGRAM [name] 12/24/2012   Procedure: LEFT AND RIGHT HEART CATHETERIZATION WITH CORONARY ANGIOGRAM;  Surgeon: [name], MD;  [facility];  Service: Cardiovascular;  Laterality: [name];  . LEG SURGERY Bilateral    lenghtening   . PACEMAKER PLACEMENT  04/13/2016   2nd deg HB after TAVR, pt had DC MDT PPM placed.  [name] SHOULDER ARTHROSCOPY  08/30/2011   Procedure: ARTHROSCOPY SHOULDER;  Surgeon: [name], MD;  Location: [facility];  Service: Orthopedics;  Laterality: Right;  Right Shoulder Arthroscopy, Debridement, and Decompression  . SPINAL CORD STIMULATOR INSERTION [name] 09/10/2015   Procedure: LUMBAR SPINAL CORD STIMULATOR INSERTION;  Surgeon: [name], MD;  Location: [facility]URO [facility]S;  Service: Neurosurgery;  Laterality: [name];  . 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  R big toe surg due to osteoarth  . TONSILLECTOMY    . traeculectomy Left    eye  . TRANSCATHETER AORTIC VALVE REPLACEMENT, TRANSFEMORAL  04/11/2016  . TRANSESOPHAGEAL ECHOCARDIOGRAM  03/09/2016   [facility]: EF 55-60%, PFO seen with bi-directional shunting, no thrombus in appendage.  . TRANSTHORACIC ECHOCARDIOGRAM  01/2015; 01/2016; 05/18/16   01/2015 No signif change in aortic stenosis (moderate).  01/2016 Severe  LVH w/small LV cavity, EF 60-65%, grade I diast dysfxn.  05/2016 (s/p TAVR): EF 50-55%, grd I DD, biopros AV good.  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 Smoking status: Never Smoker  . Smokeless tobacco: Never Used  . Alcohol use No  . Drug use: No  . Sexual activity: Not Currently

## 2016-10-19 ENCOUNTER — Other Ambulatory Visit: Payer: Self-pay | Admitting: Cardiology

## 2016-10-19 NOTE — Telephone Encounter (Signed)
REFILL 

## 2016-10-23 ENCOUNTER — Ambulatory Visit (INDEPENDENT_AMBULATORY_CARE_PROVIDER_SITE_OTHER): Payer: Medicare Other | Admitting: Orthopedic Surgery

## 2016-10-23 ENCOUNTER — Encounter (INDEPENDENT_AMBULATORY_CARE_PROVIDER_SITE_OTHER): Payer: Self-pay | Admitting: Orthopedic Surgery

## 2016-10-23 VITALS — Ht 71.0 in | Wt 325.0 lb

## 2016-10-23 DIAGNOSIS — S81812D Laceration without foreign body, left lower leg, subsequent encounter: Secondary | ICD-10-CM

## 2016-10-23 DIAGNOSIS — I872 Venous insufficiency (chronic) (peripheral): Secondary | ICD-10-CM

## 2016-10-23 NOTE — Progress Notes (Signed)
Office Visit Note   Patient: Tim [name]           Date of Birth: 05/14/1941           MRN: [id number] Visit Date: 10/23/2016              Requested by: Tammi Sou, MD [address] [place], [place] [address] PCP: Tammi Sou, MD  Chief Complaint  Patient presents with  . Right Leg - Follow-up    Calf ulceration and compression wrap application       HPI: The patient is a 76 year old gentleman Seen in follow-up for venous stasis swelling and skin tear to the right lower extremity. Has been in a Dynaflex wrap with wrestling gauze of the ulcer for the last week. She has grade wrinkling of the skin. Is pleased with his progress.   Assessment & Plan: Visit Diagnoses:  1. Venous (peripheral) insufficiency   2. Noninfected skin tear of left lower extremity, subsequent encounter     Plan:  Will reapply the Dynaflex and vaseline gauze. Follow up in one more week. Anticipate return to compression garments in 1 more week.  Follow-Up Instructions: Return in about 1 week (around 10/30/2016).   Ortho Exam  [name], oriented, no adenopathy, well-dressed, normal affect, normal respiratory effort. Right lower extremity with brawny skin color changes. Does have a proximal shin skin tear. This is 15 mm in diameter. There is no drainage. Do eshave good wrinkling of skin right lower extremity. No erythema. No weeping. Scattered ecchymosis.   Imaging: No results found.  Labs: Lab Results  Component Value Date   HGBA1C 6.1 08/25/2016   HGBA1C 5.4 05/24/2016   HGBA1C 5.4 12/14/2015   ESRSEDRATE 20 02/09/2016   ESRSEDRATE 36 (H) 01/28/2014   LABURIC 3.5 (L) 09/10/2013   LABORGA NO GROWTH 08/25/2016    Orders:  No orders of the defined types were placed in this encounter.  No orders of the defined types were placed in this encounter.    Procedures: No procedures performed  Clinical Data: No additional findings.  ROS:  All other systems negative,  except as noted in the HPI. Review of Systems  Constitutional: Negative for chills and fever.  Cardiovascular: Positive for leg swelling.  Skin: Positive for wound.    Objective: Vital Signs: Ht 5' 11"  (1.803 m)   Wt (!) 325 lb (147.4 kg)   BMI 45.33 kg/m   Specialty Comments:  No specialty comments available.  PMFS History: Patient Active Problem List   Diagnosis Date Noted  . Neuropathic pain of foot, left 06/20/2016  . 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 Status post amputation of left great toe ([place]) 06/08/2015  . Spinal stenosis of lumbar region 02/16/2015  . Hereditary and idiopathic peripheral neuropathy 01/12/2015  . Oral thrush 12/28/2014  . Postural dizziness 05/24/2014  . Paresthesias with subjective weakness 01/30/2014  . Bilateral thigh pain 01/30/2014  . Abdominal wall pain in right upper quadrant 01/22/2014  . Gross hematuria 09/25/2013  . Intertrigo 09/25/2013  . IBS (irritable bowel syndrome) 09/11/2013  . Chronic pain syndrome 08/06/2013  . CAD (coronary artery disease), native coronary artery 12/25/2012  . Paroxysmal atrial fibrillation ([place]) 12/25/2012  . Moderate aortic stenosis 12/25/2012  . Aortic stenosis 12/24/2012  . Personal history of colonic adenoma 10/30/2012  . Diverticulosis of colon (without mention of hemorrhage) 10/30/2012  . Internal hemorrhoids 10/30/2012  . Change in bowel habits intermittent loose stools 10/30/2012  .  Routine health maintenance 06/09/2012  . Hereditary peripheral neuropathy 10/10/2011  . Long term current use of anticoagulant - apixiban 08/16/2010  . Dyspnea on exertion 09/08/2009  . HYPERCHOLESTEROLEMIA-PURE 01/13/2009  . Essential hypertension, benign 01/13/2009  . EDEMA 01/13/2009  . GOUT 01/12/2009  . GLAUCOMA 01/12/2009  . Venous (peripheral) insufficiency 01/12/2009  . COPD (chronic obstructive pulmonary disease) ([place]) 01/12/2009  . VENTRAL HERNIA 01/12/2009  . OSTEOARTHRITIS 01/12/2009  . ARTHRITIS  01/12/2009  . DEGENERATIVE DISC DISEASE 01/12/2009  . CATARACT, HX OF 01/12/2009  . HEART MURMUR, HX OF 01/12/2009  . BENIGN PROSTATIC HYPERTROPHY, HX OF 01/12/2009  . Obesity 01/17/2007  . OBSTRUCTIVE SLEEP APNEA 01/17/2007  . ASTHMA 01/17/2007  . GERD 01/17/2007  . HALLUX VALGUS, ACQUIRED 01/17/2007  . PULMONARY HYPERTENSION, HX OF 01/17/2007   Past Medical History:  Diagnosis Date  . ASTHMA   . BPH (benign prostatic hypertrophy)   . CAD (coronary artery disease)    Nonobstructive by 12/2012 cath; then 03/2016 he required BMS to RCA ([facility])  . CATARACT, HX OF   . Chronic combined systolic and diastolic CHF (congestive heart failure) ([place]) 05/2016   Mild; [facility]  . COPD   . Elevated transaminase level 02/2016   Suspect due to fatty liver (documented on u/s 2007). Plan repeat labs 03/2016.  .Marland KitchenEssential hypertension, benign   . Fatty liver 2007   u/s showed fatty liver with hepatosplenomegaly  . GERD (gastroesophageal reflux disease)   . Glaucoma   . GOUT   . Hallux valgus (acquired)   . HH (hiatus hernia)   . HYPERCHOLESTEROLEMIA-PURE   . Hypogonadism male   . IFG (impaired fasting glucose)    HbA1c jumped from 5.7% to 6.1% 03/2015---started metformin at that time.  . Lumbosacral neuritis   . Lumbosacral spondylosis    Lumbar spinal stenosis with neurogenic claudication--contributes to his chronic pain syndrome  . Normal memory function 08/2014   Neuropsychological testing ([facility]): no cognitive impairment or sign of neurodegenerative disorder.  Likely has adjustment d/o with mixed anxiety/depressed features and may benefit from low dose SNRI.    .Marland KitchenNormocytic anemia 03/2016   Mild-pt needs ferritin and vit B12 level checked (as of 03/22/16)  . NSTEMI (non-ST elevated myocardial infarction) ([place] 03/20/2016   BMS to RCA  . Obesity hypoventilation syndrome ([place]   . Obesity, unspecified   . Orthostatic  hypotension   . OSA on CPAP    8 cm H2O  . OSTEOARTHRITIS   . Osteoarthrosis, unspecified whether generalized or localized, unspecified site   . Paroxysmal atrial fibrillation ([place] 2003    (? chronic?) Off anticoag for a while due to falls.  Then apixaban started 12/2014.  .Marland KitchenPeripheral neuropathy    Heredetary; with chronic neuropathic pain (Dr. KElla Bodo  . Personal history of colonic adenoma 10/30/2012   Diminutive adenoma, consider repeat 2019 per GI  . PUD (peptic ulcer disease)   . PULMONARY HYPERTENSION, HX OF   . Secondary male hypogonadism 2017  . 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 Shortness of breath    with exertion: much improved s/p TAVR and treatment for CHF.  .Marland KitchenUnspecified glaucoma(365.9)   . Unspecified hereditary and idiopathic peripheral neuropathy approx age 89  bilat LE's, ? left arm, too.  Feet became progressively numb + left foot pain intermittently.  Pt may be trying a spinal  stimulator (as of 05/2015)  . VENOUS INSUFFICIENCY    Being followed by Dr. Sharol Given as of 10/2016 for two R LL venous stasis ulcers  . VENTRAL HERNIA     Family History  Problem Relation Age of Onset  . Hypertension Mother   . Coronary artery disease Mother   . Heart attack Mother   . Neuropathy Mother   . Pulmonary fibrosis Father     asbestosis    Past Surgical History:  Procedure Laterality Date  . AMPUTATION Left 04/11/2013   Procedure: AMPUTATION DIGIT Left 3rd toe;  Surgeon: [name], MD;  Location: [facility];  Service: Orthopedics;  Laterality: Left;  Left 3rd toe amputation at [facility]  . CARDIAC CATHETERIZATION  1997; 03/10/16   1997 Non-obstructive disease.  03/2016 BMS to RCA, with 25% pDiag dz, o/w normal cors per cath 03/07/16.  [name] CARDIAC CATHETERIZATION  12/24/2012   mild < 20% LCx, prox 30% RCA; LVEF 55-65% , moderate pulmonary HTN, moderate AS  . CARDIOVASCULAR STRESS TEST  05/11/16 ([facility])   Myocardial  perfusion imaging:  No ischemia; scar in apex, global hypokinesis, EF 36%.  . Carotid dopplers  03/09/2016   [facility]: no hemodynamically significant stenosis on either side.  . CHOLECYSTECTOMY    . COLONOSCOPY    . COLONOSCOPY [name] 10/30/2012   Procedure: COLONOSCOPY;  Surgeon: [name], MD;  [facility];  Service: Endoscopy;  Laterality: [name];  . CORONARY ANGIOPLASTY WITH STENT PLACEMENT  03/2016   [facility]: BMS to RCA-pt was placed on Brilinta.  [name] EYE SURGERY Bilateral cataract  . HEMORRHOID SURGERY    . INTRAOCULAR LENS INSERTION Bilateral   . KNEE SURGERY Right   . LEFT AND RIGHT HEART CATHETERIZATION WITH CORONARY ANGIOGRAM [name] 12/24/2012   Procedure: LEFT AND RIGHT HEART CATHETERIZATION WITH CORONARY ANGIOGRAM;  Surgeon: [name], MD;  Location: [facility] CATH LAB;  Service: Cardiovascular;  Laterality: [name];  . LEG SURGERY Bilateral    lenghtening   . PACEMAKER PLACEMENT  04/13/2016   2nd deg HB after TAVR, pt had DC MDT PPM placed.  [name] SHOULDER ARTHROSCOPY  08/30/2011   Procedure: ARTHROSCOPY SHOULDER;  Surgeon: [name], MD;  Location: [facility];  Service: Orthopedics;  Laterality: Right;  Right Shoulder Arthroscopy, Debridement, and Decompression  . SPINAL CORD STIMULATOR INSERTION [name] 09/10/2015   Procedure: LUMBAR SPINAL CORD STIMULATOR INSERTION;  Surgeon: [name], MD;  Location: [facility];  Service: Neurosurgery;  Laterality: [name];  . 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  R big toe surg due to osteoarth  . TONSILLECTOMY    . traeculectomy Left    eye  . TRANSCATHETER AORTIC VALVE REPLACEMENT, TRANSFEMORAL  04/11/2016  . TRANSESOPHAGEAL ECHOCARDIOGRAM  03/09/2016   [facility]: EF 55-60%, PFO seen with bi-directional shunting, no thrombus in appendage.  . TRANSTHORACIC ECHOCARDIOGRAM  01/2015; 01/2016; 05/18/16   01/2015 No signif change in aortic stenosis (moderate).  01/2016 Severe LVH w/small LV cavity, EF 60-65%, grade I diast dysfxn.  05/2016 (s/p TAVR):  EF 50-55%, grd I DD, biopros AV good.  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 Smoking status: Never Smoker  . Smokeless tobacco: Never Used  . Alcohol use No  . Drug use: No  . Sexual activity: Not Currently

## 2016-10-24 ENCOUNTER — Encounter: Payer: Medicare Other | Attending: Registered Nurse | Admitting: Registered Nurse

## 2016-10-24 ENCOUNTER — Encounter: Payer: Self-pay | Admitting: Registered Nurse

## 2016-10-24 VITALS — BP 143/82 | HR 85

## 2016-10-24 DIAGNOSIS — K219 Gastro-esophageal reflux disease without esophagitis: Secondary | ICD-10-CM | POA: Diagnosis not present

## 2016-10-24 DIAGNOSIS — J449 Chronic obstructive pulmonary disease, unspecified: Secondary | ICD-10-CM | POA: Diagnosis not present

## 2016-10-24 DIAGNOSIS — M792 Neuralgia and neuritis, unspecified: Secondary | ICD-10-CM

## 2016-10-24 DIAGNOSIS — Z5181 Encounter for therapeutic drug level monitoring: Secondary | ICD-10-CM

## 2016-10-24 DIAGNOSIS — G609 Hereditary and idiopathic neuropathy, unspecified: Secondary | ICD-10-CM | POA: Diagnosis not present

## 2016-10-24 DIAGNOSIS — M109 Gout, unspecified: Secondary | ICD-10-CM | POA: Diagnosis not present

## 2016-10-24 DIAGNOSIS — G894 Chronic pain syndrome: Secondary | ICD-10-CM

## 2016-10-24 DIAGNOSIS — M1990 Unspecified osteoarthritis, unspecified site: Secondary | ICD-10-CM | POA: Insufficient documentation

## 2016-10-24 DIAGNOSIS — M48062 Spinal stenosis, lumbar region with neurogenic claudication: Secondary | ICD-10-CM | POA: Diagnosis not present

## 2016-10-24 DIAGNOSIS — I2510 Atherosclerotic heart disease of native coronary artery without angina pectoris: Secondary | ICD-10-CM | POA: Insufficient documentation

## 2016-10-24 DIAGNOSIS — M79672 Pain in left foot: Secondary | ICD-10-CM | POA: Diagnosis present

## 2016-10-24 DIAGNOSIS — G5792 Unspecified mononeuropathy of left lower limb: Secondary | ICD-10-CM

## 2016-10-24 DIAGNOSIS — Z79899 Other long term (current) drug therapy: Secondary | ICD-10-CM | POA: Diagnosis not present

## 2016-10-24 DIAGNOSIS — E7800 Pure hypercholesterolemia, unspecified: Secondary | ICD-10-CM | POA: Insufficient documentation

## 2016-10-24 MED ORDER — OXYCODONE HCL 10 MG PO TABS: 10.0000 mg | ORAL_TABLET | Freq: Four times a day (QID) | ORAL | 0 refills | Status: DC

## 2016-10-24 NOTE — Progress Notes (Signed)
Subjective:    Patient ID: Tim [name], male    DOB: 1940/10/17, 76 y.o.   MRN: [id number]  HPI: Tim [name] is a 76year old male who returns for follow upappointmentfor chronic pain and medication refill. He states his pain is located in his lower back and left foot. He rates his pain 7. His current exercise regime is walking daily for 3 minutes daily. Tim [name] states he walked into a piece of metal three weeks ago, Dr. Sharol Given following, he has a right lower extremity Dynaflex wrap.  His last UDS was on 06/20/16, it was consistent, UDS ordered today.   Pain Inventory Average Pain 4 Pain Right Now 7 My pain is intermittent, sharp, burning, dull, stabbing, tingling and aching  In the last 24 hours, has pain interfered with the following? General activity 7 Relation with others 9 Enjoyment of life 5 What TIME of day is your pain at its worst? night Sleep (in general) Poor  Pain is worse with: walking, bending, standing and some activites Pain improves with: rest, pacing activities, medication, TENS and injections Relief from Meds: 7  Mobility use a cane how many minutes can you walk? 3 ability to climb steps?  yes do you drive?  yes  Function retired I need assistance with the following:  household duties and shopping  Neuro/Psych weakness numbness tremor tingling trouble walking dizziness  Prior Studies Any changes since last visit?  no  Physicians involved in your care Any changes since last visit?  no   Family History  Problem Relation Age of Onset  . Hypertension Mother   . Coronary artery disease Mother   . Heart attack Mother   . Neuropathy Mother   . Pulmonary fibrosis Father     asbestosis   Social History   Social History  . Marital status: Married    Spouse name: [name]  . Number of children: 0  . Years of education: 20   Occupational History  . engineering     retired   Social History Main Topics  . Smoking status: Never  Smoker  . Smokeless tobacco: Never Used  . Alcohol use No  . Drug use: No  . Sexual activity: Not Currently   Other Topics Concern  . None   Social History Narrative   HSG, [name] - BS, [name] - MS-engineering, 2 years on PhD - [name]. Married - '65 - 59yr/divorced; '76- 3 yrs/divorced; '92 . No children. 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 ACP/Living Will - Yes CPR; long-term Mechanical ventilation as long as he was able to cognate; ok for long term artificial nutrition. Precondition being able to cognate and not to have too much pain.    Past Surgical History:  Procedure Laterality Date  . AMPUTATION Left 04/11/2013   Procedure: AMPUTATION DIGIT Left 3rd toe;  Surgeon: [name] MD;  [place]  Service: Orthopedics;  Laterality: Left;  Left 3rd toe amputation at [facility]  . CARDIAC CATHETERIZATION  1997; 03/10/16   1997 Non-obstructive disease.  03/2016 BMS to RCA, with 25% pDiag dz, o/w normal cors per cath 03/07/16.  .Marland KitchenCARDIAC CATHETERIZATION  12/24/2012   mild < 20% LCx, prox 30% RCA; LVEF 55-65% , moderate pulmonary HTN, moderate AS  . CARDIOVASCULAR STRESS TEST  05/11/16 ([facility])   Myocardial perfusion imaging:  No ischemia; scar in apex, global hypokinesis, EF 36%.  . Carotid dopplers  03/09/2016   [facility]: no hemodynamically  significant stenosis on either side.  . CHOLECYSTECTOMY    . COLONOSCOPY    . COLONOSCOPY [name] 10/30/2012   Procedure: COLONOSCOPY;  Surgeon: [name], MD;  [facility];  Service: Endoscopy;  Laterality: [name];  . CORONARY ANGIOPLASTY WITH STENT PLACEMENT  03/2016   [facility]: BMS to RCA-pt was placed on Brilinta.  [name] EYE SURGERY Bilateral cataract  . HEMORRHOID SURGERY    . INTRAOCULAR LENS INSERTION Bilateral   . KNEE SURGERY Right   . LEFT AND RIGHT HEART CATHETERIZATION WITH CORONARY ANGIOGRAM [name] 12/24/2012   Procedure: LEFT AND RIGHT HEART CATHETERIZATION WITH CORONARY ANGIOGRAM;  Surgeon: Peter M  Martinique, MD;  Location[name] C[facility] LAB;  Service: Cardiovascular;  Laterality: [name];  . LEG SURGERY Bilateral    lenghtening   . PACEMAKER PLACEMENT  04/13/2016   2nd deg HB after TAVR, pt had DC MDT PPM placed.  [name] SHOULDER ARTHROSCOPY  08/30/2011   Procedure: ARTHROSCOPY SHOULDER;  Surgeon: [name], MD;  Location: [place];  Service: Orthopedics;  Laterality: Right;  Right Shoulder Arthroscopy, Debridement, and Decompression  . SPINAL CORD STIMULATOR INSERTION [name] 09/10/2015   Procedure: LUMBAR SPINAL CORD STIMULATOR INSERTION;  Surgeon: [name], MD;  Location: [place] [facility];  Service: Neurosurgery;  Laterality: [name];  . 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  R big toe surg due to osteoarth  . TONSILLECTOMY    . traeculectomy Left    eye  . TRANSCATHETER AORTIC VALVE REPLACEMENT, TRANSFEMORAL  04/11/2016  . TRANSESOPHAGEAL ECHOCARDIOGRAM  03/09/2016   [facility]: EF 55-60%, PFO seen with bi-directional shunting, no thrombus in appendage.  . TRANSTHORACIC ECHOCARDIOGRAM  01/2015; 01/2016; 05/18/16   01/2015 No signif change in aortic stenosis (moderate).  01/2016 Severe LVH w/small LV cavity, EF 60-65%, grade I diast dysfxn.  05/2016 (s/p TAVR): EF 50-55%, grd I DD, biopros AV good.  [name] VITRECTOMY     Past Medical History:  Diagnosis Date  . ASTHMA   . BPH (benign prostatic hypertrophy)   . CAD (coronary artery disease)    Nonobstructive by 12/2012 cath; then 03/2016 he required BMS to RCA ([facility])  . CATARACT, HX OF   . Chronic combined systolic and diastolic CHF (congestive heart failure) ([place]) 05/2016   Mild; [facility]  . COPD   . Elevated transaminase level 02/2016   Suspect due to fatty liver (documented on u/s 2007). Plan repeat labs 03/2016.  .Marland KitchenEssential hypertension, benign   . Fatty liver 2007   u/s showed fatty liver with hepatosplenomegaly  . GERD (gastroesophageal reflux disease)   . Glaucoma   . GOUT   . Hallux valgus  (acquired)   . HH (hiatus hernia)   . HYPERCHOLESTEROLEMIA-PURE   . Hypogonadism male   . IFG (impaired fasting glucose)    HbA1c jumped from 5.7% to 6.1% 03/2015---started metformin at that time.  . Lumbosacral neuritis   . Lumbosacral spondylosis    Lumbar spinal stenosis with neurogenic claudication--contributes to his chronic pain syndrome  . Normal memory function 08/2014   Neuropsychological testing ([facility]): no cognitive impairment or sign of neurodegenerative disorder.  Likely has adjustment d/o with mixed anxiety/depressed features and may benefit from low dose SNRI.    .Marland KitchenNormocytic anemia 03/2016   Mild-pt needs ferritin and vit B12 level checked (as of 03/22/16)  . NSTEMI (non-ST elevated myocardial infarction) ([place] 03/20/2016   BMS to RCA  . Obesity hypoventilation syndrome ([place]   .  Obesity, unspecified   . Orthostatic hypotension   . OSA on CPAP    8 cm H2O  . OSTEOARTHRITIS   . Osteoarthrosis, unspecified whether generalized or localized, unspecified site   . Paroxysmal atrial fibrillation ([place]) 2003    (? chronic?) Off anticoag for a while due to falls.  Then apixaban started 12/2014.  [name] Peripheral neuropathy    Heredetary; with chronic neuropathic pain (Dr. Ella Bodo)  . Personal history of colonic adenoma 10/30/2012   Diminutive adenoma, consider repeat 2019 per GI  . PUD (peptic ulcer disease)   . PULMONARY HYPERTENSION, HX OF   . Secondary male hypogonadism 2017  . 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 Shortness of breath    with exertion: much improved s/p TAVR and treatment for CHF.  [name] Unspecified glaucoma(365.9)   . Unspecified hereditary and idiopathic peripheral neuropathy approx age 79   bilat LE's, ? left arm, too.  Feet became progressively numb + left foot pain intermittently.  Pt may be trying a spinal stimulator (as of 05/2015)  . VENOUS INSUFFICIENCY    Being  followed by Dr. Sharol Given as of 10/2016 for two R LL venous stasis ulcers  . VENTRAL HERNIA    BP (!) 143/82 (BP Location: Left Arm, Patient Position: Sitting, Cuff Size: Large)   Pulse 85   SpO2 96%   Opioid Risk Score:   Fall Risk Score:  `1  Depression screen PHQ 2/9  Depression screen [name] 2/9 09/23/2015 07/15/2015 03/16/2015 02/16/2015 09/16/2014  Decreased Interest 0 0 0 0 1  Down, Depressed, Hopeless 0 0 0 0 0  PHQ - 2 Score 0 0 0 0 1  Altered sleeping - - - - 2  Tired, decreased energy - - - - 2  Change in appetite - - - - 1  Feeling bad or failure about yourself  - - - - 0  Trouble concentrating - - - - 0  Moving slowly or fidgety/restless - - - - 0  Suicidal thoughts - - - - 0  PHQ-9 Score - - - - 6  Some recent data might be hidden    Review of Systems  HENT: Negative.   Eyes: Negative.   Respiratory: Negative.   Cardiovascular: Negative.   Gastrointestinal: Negative.   Endocrine: Negative.   Genitourinary: Negative.   Musculoskeletal: Positive for gait problem.  Skin: Negative.   Neurological: Positive for dizziness, tremors, weakness and numbness.       Tingling  Hematological: Negative.   Psychiatric/Behavioral: Negative.   All other systems reviewed and are negative.      Objective:   Physical Exam  Constitutional: He is oriented to person, place, and time. He appears well-developed and well-nourished.  HENT:  Head: Normocephalic and atraumatic.  Neck: Normal range of motion. Neck supple.  Cardiovascular: Normal rate and regular rhythm.   Pulmonary/Chest: Effort normal and breath sounds normal.  Musculoskeletal:  Normal Muscle Bulk and Muscle Testing reveals: Upper Extremities: Right: Decreased ROM 90 Degrees and Muscle Strength 4/5 Left: Full ROM and Muscle Strength 5/5 Thoracic Paraspinal Tenderness: T-3-T-7 Mainly Right Side Lower Extremities: Full ROM and Muscle Strength 5/5 Right Leg Dressing  Neurological: He is alert and oriented to person, place,  and time.  Skin: Skin is warm and dry.  Psychiatric: He has a normal mood and affect.  Nursing note and vitals reviewed.  Assessment & Plan:  1. 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 Continue Gabapentin. O4/24/2018 Refilled:Oxycodone 10 mg one tablet QID#120.  We will continue the opioid monitoring program, this consists of regular clinic visits, examinations, urine drug screen, pill counts as well as use of [place] Controlled Substance Reporting System. 2. DDD lumbar spine with LBP: Neurology Following. Continue with activity and heat therapy. 10/24/2016 S/P Permanent Spinal Stimulator Placed 09/10/15 via Dr. Maryjean Ka, with improvement of pain. 10/24/2016  20 minutes of face to face patient care time was spent during this visit. All questions were encouraged and answered.  F/U in 1 month

## 2016-10-25 ENCOUNTER — Other Ambulatory Visit: Payer: Self-pay | Admitting: Physical Medicine & Rehabilitation

## 2016-10-27 ENCOUNTER — Telehealth: Payer: Self-pay | Admitting: *Deleted

## 2016-10-27 LAB — TOXASSURE SELECT,+ANTIDEPR,UR

## 2016-10-27 NOTE — Telephone Encounter (Signed)
Urine drug screen for this encounter is consistent for prescribed medication. Oxycodone and tramadol are both prescribed and expected to be present.

## 2016-10-30 ENCOUNTER — Encounter (INDEPENDENT_AMBULATORY_CARE_PROVIDER_SITE_OTHER): Payer: Self-pay | Admitting: Orthopedic Surgery

## 2016-10-30 ENCOUNTER — Ambulatory Visit (INDEPENDENT_AMBULATORY_CARE_PROVIDER_SITE_OTHER): Payer: Medicare Other | Admitting: Family

## 2016-10-30 VITALS — Ht 71.0 in | Wt 325.0 lb

## 2016-10-30 DIAGNOSIS — S81811D Laceration without foreign body, right lower leg, subsequent encounter: Secondary | ICD-10-CM

## 2016-10-30 DIAGNOSIS — I872 Venous insufficiency (chronic) (peripheral): Secondary | ICD-10-CM

## 2016-10-30 NOTE — Progress Notes (Signed)
Office Visit Note   Patient: Tim [name]           Date of Birth: 02/08/1941           MRN: [id number] Visit Date: 10/30/2016              Requested by: Tammi Sou, MD [id number] [place], [place] [address] PCP: Tammi Sou, MD  Chief Complaint  Patient presents with  . Right Leg - Follow-up    VSI RLE      HPI: The patient is a 76 year old gentleman seen in follow-up for venous stasis swelling and skin tear to the right lower extremity. Has been in a Dynaflex wrap with Vaseline gauze over the ulcer for the last week. He has great wrinkling of the skin. Is pleased with his progress.   Assessment & Plan: Visit Diagnoses:  1. Venous (peripheral) insufficiency   2. Noninfected skin tear of right lower extremity, subsequent encounter     Plan:  He will return to his Circaid compression wraps. Follow up in office as needed.  Follow-Up Instructions: Return if symptoms worsen or fail to improve.   Ortho Exam  [name], oriented, no adenopathy, well-dressed, normal affect, normal respiratory effort. Right lower extremity with brawny skin color changes. Proximal shin skin tear is well healed. There is no drainage. Does have good wrinkling of skin right lower extremity. No erythema. No weeping. Scattered ecchymosis.   Imaging: No results found.  Labs: Lab Results  Component Value Date   HGBA1C 6.1 08/25/2016   HGBA1C 5.4 05/24/2016   HGBA1C 5.4 12/14/2015   ESRSEDRATE 20 02/09/2016   ESRSEDRATE 36 (H) 01/28/2014   LABURIC 3.5 (L) 09/10/2013   LABORGA NO GROWTH 08/25/2016    Orders:  No orders of the defined types were placed in this encounter.  No orders of the defined types were placed in this encounter.    Procedures: No procedures performed  Clinical Data: No additional findings.  ROS:  All other systems negative, except as noted in the HPI. Review of Systems  Constitutional: Negative for chills and fever.  Cardiovascular:  Positive for leg swelling.  Skin: Positive for color change. Negative for wound.    Objective: Vital Signs: Ht 5' 11"  (1.803 m)   Wt (!) 325 lb (147.4 kg)   BMI 45.33 kg/m   Specialty Comments:  No specialty comments available.  PMFS History: Patient Active Problem List   Diagnosis Date Noted  . Neuropathic pain of foot, left 06/20/2016  . 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 Status post amputation of left great toe ([place]) 06/08/2015  . Spinal stenosis of lumbar region 02/16/2015  . Hereditary and idiopathic peripheral neuropathy 01/12/2015  . Oral thrush 12/28/2014  . Postural dizziness 05/24/2014  . Paresthesias with subjective weakness 01/30/2014  . Bilateral thigh pain 01/30/2014  . Abdominal wall pain in right upper quadrant 01/22/2014  . Gross hematuria 09/25/2013  . Intertrigo 09/25/2013  . IBS (irritable bowel syndrome) 09/11/2013  . Chronic pain syndrome 08/06/2013  . CAD (coronary artery disease), native coronary artery 12/25/2012  . Paroxysmal atrial fibrillation ([place]) 12/25/2012  . Moderate aortic stenosis 12/25/2012  . Aortic stenosis 12/24/2012  . Personal history of colonic adenoma 10/30/2012  . Diverticulosis of colon (without mention of hemorrhage) 10/30/2012  . Internal hemorrhoids 10/30/2012  . Change in bowel habits intermittent loose stools 10/30/2012  . Routine health maintenance 06/09/2012  . Hereditary peripheral neuropathy 10/10/2011  . Long  term current use of anticoagulant - apixiban 08/16/2010  . Dyspnea on exertion 09/08/2009  . HYPERCHOLESTEROLEMIA-PURE 01/13/2009  . Essential hypertension, benign 01/13/2009  . EDEMA 01/13/2009  . GOUT 01/12/2009  . GLAUCOMA 01/12/2009  . Venous (peripheral) insufficiency 01/12/2009  . COPD (chronic obstructive pulmonary disease) ([place]) 01/12/2009  . VENTRAL HERNIA 01/12/2009  . OSTEOARTHRITIS 01/12/2009  . ARTHRITIS 01/12/2009  . DEGENERATIVE DISC DISEASE 01/12/2009  . CATARACT, HX OF 01/12/2009  . HEART  MURMUR, HX OF 01/12/2009  . BENIGN PROSTATIC HYPERTROPHY, HX OF 01/12/2009  . Obesity 01/17/2007  . OBSTRUCTIVE SLEEP APNEA 01/17/2007  . ASTHMA 01/17/2007  . GERD 01/17/2007  . HALLUX VALGUS, ACQUIRED 01/17/2007  . PULMONARY HYPERTENSION, HX OF 01/17/2007   Past Medical History:  Diagnosis Date  . ASTHMA   . BPH (benign prostatic hypertrophy)   . CAD (coronary artery disease)    Nonobstructive by 12/2012 cath; then 03/2016 he required BMS to RCA ([facility])  . CATARACT, HX OF   . Chronic combined systolic and diastolic CHF (congestive heart failure) ([place]) 05/2016   Mild; [facility]ade[facility] 39m [facility]d [facility]  . COPD   . Elevated transaminase level 02/2016   Suspect due to fatty liver (documented on u/s 2007). Plan repeat labs 03/2016.  .Marland KitchenEssential hypertension, benign   . Fatty liver 2007   u/s showed fatty liver with hepatosplenomegaly  . GERD (gastroesophageal reflux disease)   . Glaucoma   . GOUT   . Hallux valgus (acquired)   . HH (hiatus hernia)   . HYPERCHOLESTEROLEMIA-PURE   . Hypogonadism male   . IFG (impaired fasting glucose)    HbA1c jumped from 5.7% to 6.1% 03/2015---started metformin at that time.  . Lumbosacral neuritis   . Lumbosacral spondylosis    Lumbar spinal stenosis with neurogenic claudication--contributes to his chronic pain syndrome  . Normal memory function 08/2014   Neuropsychological testing ([facility]): no cognitive impairment or sign of neurodegenerative disorder.  Likely has adjustment d/o with mixed anxiety/depressed features and may benefit from low dose SNRI.    .Marland KitchenNormocytic anemia 03/2016   Mild-pt needs ferritin and vit B12 level checked (as of 03/22/16)  . NSTEMI (non-ST elevated myocardial infarction) ([place] 03/20/2016   BMS to RCA  . Obesity hypoventilation syndrome ([place]   . Obesity, unspecified   . Orthostatic hypotension   . OSA on CPAP    8 cm H2O  . OSTEOARTHRITIS   . Osteoarthrosis, unspecified whether  generalized or localized, unspecified site   . Paroxysmal atrial fibrillation ([place] 2003    (? chronic?) Off anticoag for a while due to falls.  Then apixaban started 12/2014.  .Marland KitchenPeripheral neuropathy    Heredetary; with chronic neuropathic pain (Dr. KElla Bodo  . Personal history of colonic adenoma 10/30/2012   Diminutive adenoma, consider repeat 2019 per GI  . PUD (peptic ulcer disease)   . PULMONARY HYPERTENSION, HX OF   . Secondary male hypogonadism 2017  . 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 Shortness of breath    with exertion: much improved s/p TAVR and treatment for CHF.  .Marland KitchenUnspecified glaucoma(365.9)   . Unspecified hereditary and idiopathic peripheral neuropathy approx age 76  bilat LE's, ? left arm, too.  Feet became progressively numb + left foot pain intermittently.  Pt may be trying a spinal stimulator (as of 05/2015)  . VENOUS INSUFFICIENCY    Being followed  by Dr. Sharol Given as of 10/2016 for two R LL venous stasis ulcers  . VENTRAL HERNIA     Family History  Problem Relation Age of Onset  . Hypertension Mother   . Coronary artery disease Mother   . Heart attack Mother   . Neuropathy Mother   . Pulmonary fibrosis Father     asbestosis    Past Surgical History:  Procedure Laterality Date  . AMPUTATION Left 04/11/2013   Procedure: AMPUTATION DIGIT Left 3rd toe;  Surgeon: [name], MD;  Location: [place];  Service: Orthopedics;  Laterality: Left;  Left 3rd toe amputation at [facility]  . CARDIAC CATHETERIZATION  1997; 03/10/16   1997 Non-obstructive disease.  03/2016 BMS to RCA, with 25% pDiag dz, o/w normal cors per cath 03/07/16.  [name] CARDIAC CATHETERIZATION  12/24/2012   mild < 20% LCx, prox 30% RCA; LVEF 55-65% , moderate pulmonary HTN, moderate AS  . CARDIOVASCULAR STRESS TEST  05/11/16 ([facility])   Myocardial perfusion imaging:  No ischemia; scar in apex, global hypokinesis, EF 36%.  . Carotid dopplers  03/09/2016    [facility]: no hemodynamically significant stenosis on either side.  . CHOLECYSTECTOMY    . COLONOSCOPY    . COLONOSCOPY [name] 10/30/2012   Procedure: COLONOSCOPY;  Surgeon: [name], MD;  [facility];  Service: Endoscopy;  Laterality: [name];  . CORONARY ANGIOPLASTY WITH STENT PLACEMENT  03/2016   [facility]: BMS to RCA-pt was placed on Brilinta.  [name] EYE SURGERY Bilateral cataract  . HEMORRHOID SURGERY    . INTRAOCULAR LENS INSERTION Bilateral   . KNEE SURGERY Right   . LEFT AND RIGHT HEART CATHETERIZATION WITH CORONARY ANGIOGRAM [name] 12/24/2012   Procedure: LEFT AND RIGHT HEART CATHETERIZATION WITH CORONARY ANGIOGRAM;  Surgeon: [name], MD;  Location[facility] C[facility] LAB;  Service: Cardiovascular;  Laterality: [name];  . LEG SURGERY Bilateral    lenghtening   . PACEMAKER PLACEMENT  04/13/2016   2nd deg HB after TAVR, pt had DC MDT PPM placed.  [name] SHOULDER ARTHROSCOPY  08/30/2011   Procedure: ARTHROSCOPY SHOULDER;  Surgeon: [name], MD;  Location[place];  Service: Orthopedics;  Laterality: Right;  Right Shoulder Arthroscopy, Debridement, and Decompression  . SPINAL CORD STIMULATOR INSERTION [name] 09/10/2015   Procedure: LUMBAR SPINAL CORD STIMULATOR INSERTION;  Surgeon: [name], MD;  Location: [facility];  Service: Neurosurgery;  Laterality: [name];  . 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  R big toe surg due to osteoarth  . TONSILLECTOMY    . traeculectomy Left    eye  . TRANSCATHETER AORTIC VALVE REPLACEMENT, TRANSFEMORAL  04/11/2016  . TRANSESOPHAGEAL ECHOCARDIOGRAM  03/09/2016   [facility]: EF 55-60%, PFO seen with bi-directional shunting, no thrombus in appendage.  . TRANSTHORACIC ECHOCARDIOGRAM  01/2015; 01/2016; 05/18/16   01/2015 No signif change in aortic stenosis (moderate).  01/2016 Severe LVH w/small LV cavity, EF 60-65%, grade I diast dysfxn.  05/2016 (s/p TAVR): EF 50-55%, grd I DD, biopros AV good.  [name] VITRECTOMY     Social History   Occupational History  .  engineering     retired   Social History Main Topics  . Smoking status: Never Smoker  . Smokeless tobacco: Never Used  . Alcohol use No  . Drug use: No  . Sexual activity: Not Currently

## 2016-10-31 ENCOUNTER — Encounter: Payer: Self-pay | Admitting: Family Medicine

## 2016-11-24 ENCOUNTER — Telehealth: Payer: Self-pay | Admitting: Registered Nurse

## 2016-11-24 NOTE — Telephone Encounter (Signed)
On 11/24/2016 the  [place] was reviewed no conflict was seen on the [place] with multiple prescribers. [name] has a signed narcotic contract with our office. If there were any discrepancies this would have been reported to his physician.

## 2016-11-28 ENCOUNTER — Encounter: Payer: Self-pay | Admitting: Registered Nurse

## 2016-11-28 ENCOUNTER — Encounter: Payer: Medicare Other | Attending: Registered Nurse | Admitting: Registered Nurse

## 2016-11-28 VITALS — BP 146/83 | HR 88 | Resp 14

## 2016-11-28 DIAGNOSIS — K219 Gastro-esophageal reflux disease without esophagitis: Secondary | ICD-10-CM | POA: Insufficient documentation

## 2016-11-28 DIAGNOSIS — G5792 Unspecified mononeuropathy of left lower limb: Secondary | ICD-10-CM

## 2016-11-28 DIAGNOSIS — Z79899 Other long term (current) drug therapy: Secondary | ICD-10-CM | POA: Diagnosis not present

## 2016-11-28 DIAGNOSIS — I2510 Atherosclerotic heart disease of native coronary artery without angina pectoris: Secondary | ICD-10-CM | POA: Diagnosis not present

## 2016-11-28 DIAGNOSIS — M79672 Pain in left foot: Secondary | ICD-10-CM | POA: Diagnosis present

## 2016-11-28 DIAGNOSIS — M48062 Spinal stenosis, lumbar region with neurogenic claudication: Secondary | ICD-10-CM

## 2016-11-28 DIAGNOSIS — E7800 Pure hypercholesterolemia, unspecified: Secondary | ICD-10-CM | POA: Diagnosis not present

## 2016-11-28 DIAGNOSIS — G894 Chronic pain syndrome: Secondary | ICD-10-CM | POA: Diagnosis not present

## 2016-11-28 DIAGNOSIS — G609 Hereditary and idiopathic neuropathy, unspecified: Secondary | ICD-10-CM | POA: Diagnosis not present

## 2016-11-28 DIAGNOSIS — Z5181 Encounter for therapeutic drug level monitoring: Secondary | ICD-10-CM

## 2016-11-28 DIAGNOSIS — M109 Gout, unspecified: Secondary | ICD-10-CM | POA: Diagnosis not present

## 2016-11-28 DIAGNOSIS — J449 Chronic obstructive pulmonary disease, unspecified: Secondary | ICD-10-CM | POA: Insufficient documentation

## 2016-11-28 DIAGNOSIS — M1990 Unspecified osteoarthritis, unspecified site: Secondary | ICD-10-CM | POA: Insufficient documentation

## 2016-11-28 DIAGNOSIS — M792 Neuralgia and neuritis, unspecified: Secondary | ICD-10-CM

## 2016-11-28 MED ORDER — TRAMADOL-ACETAMINOPHEN 37.5-325 MG PO TABS: 1.0000 | ORAL_TABLET | Freq: Three times a day (TID) | ORAL | 3 refills | Status: DC | PRN

## 2016-11-28 MED ORDER — OXYCODONE HCL 10 MG PO TABS: 10.0000 mg | ORAL_TABLET | Freq: Four times a day (QID) | ORAL | 0 refills | Status: DC

## 2016-11-28 NOTE — Progress Notes (Signed)
Subjective:    Patient ID: Tim [name], male    DOB: 12-17-1940, 76 y.o.   MRN: [id number]  HPI: Tim [name] is a 76year old male who returns for follow upappointmentfor chronic pain and medication refill. He states his pain is located in his mid- back and left foot.He rates his pain 1. His current exercise regime is walking.  Tim [name] has titrated Tramadol to TID Prn as needed, instructed to try to decrease Oxycodone to TID as needed, he verbalizes understanding. Instructed to monitor his pain, he verbalizes understanding.   His last UDS was on 10/24/2016 it was consistent.   Pain Inventory Average Pain 6 Pain Right Now 1 My pain is intermittent, sharp, burning, dull, stabbing, tingling and aching  In the last 24 hours, has pain interfered with the following? General activity 7 Relation with others 7 Enjoyment of life 3 What TIME of day is your pain at its worst? night Sleep (in general) Poor  Pain is worse with: walking, bending, standing and some activites Pain improves with: rest, pacing activities and medication Relief from Meds: 6  Mobility walk with assistance use a cane how many minutes can you walk? 3 do you drive?  yes  Function retired I need assistance with the following:  household duties and shopping  Neuro/Psych weakness numbness tremor trouble walking  Prior Studies Any changes since last visit?  no  Physicians involved in your care Any changes since last visit?  no   Family History  Problem Relation Age of Onset  . Hypertension Mother   . Coronary artery disease Mother   . Heart attack Mother   . Neuropathy Mother   . Pulmonary fibrosis Father        asbestosis   Social History   Social History  . Marital status: Married    Spouse name: [name]  . Number of children: 0  . Years of education: 20   Occupational History  . engineering     retired   Social History Main Topics  . Smoking status: Never Smoker  .  Smokeless tobacco: Never Used  . Alcohol use No  . Drug use: No  . Sexual activity: Not Currently   Other Topics Concern  . None   Social History Narrative   HSG, [name] - BS, [name] - MS-engineering, 2 years on PhD - [name]. Married - '65 - 73yr/divorced; '76- 3 yrs/divorced; '92 . No children. 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 ACP/Living Will - Yes CPR; long-term Mechanical ventilation as long as he was able to cognate; ok for long term artificial nutrition. Precondition being able to cognate and not to have too much pain.    Past Surgical History:  Procedure Laterality Date  . AMPUTATION Left 04/11/2013   Procedure: AMPUTATION DIGIT Left 3rd toe;  Surgeon: [name] MD;  [place]  Service: Orthopedics;  Laterality: Left;  Left 3rd toe amputation at [facility]  . CARDIAC CATHETERIZATION  1997; 03/10/16   1997 Non-obstructive disease.  03/2016 BMS to RCA, with 25% pDiag dz, o/w normal cors per cath 03/07/16.  .Marland KitchenCARDIAC CATHETERIZATION  12/24/2012   mild < 20% LCx, prox 30% RCA; LVEF 55-65% , moderate pulmonary HTN, moderate AS  . CARDIOVASCULAR STRESS TEST  05/11/16 ([facility])   Myocardial perfusion imaging:  No ischemia; scar in apex, global hypokinesis, EF 36%.  . Carotid dopplers  03/09/2016   [facility]: no hemodynamically significant stenosis on either  side.  . CHOLECYSTECTOMY    . COLONOSCOPY    . COLONOSCOPY [name] 10/30/2012   Procedure: COLONOSCOPY;  Surgeon: [name], MD;  [facility];  Service: Endoscopy;  Laterality: [name];  . CORONARY ANGIOPLASTY WITH STENT PLACEMENT  03/2016   [facility]: BMS to RCA-pt was placed on Brilinta.  [name] EYE SURGERY Bilateral cataract  . HEMORRHOID SURGERY    . INTRAOCULAR LENS INSERTION Bilateral   . KNEE SURGERY Right   . LEFT AND RIGHT HEART CATHETERIZATION WITH CORONARY ANGIOGRAM [name] 12/24/2012   Procedure: LEFT AND RIGHT HEART CATHETERIZATION WITH CORONARY ANGIOGRAM;  Surgeon: [name],  MD;  Location[facility] CATH LAB;  Service: Cardiovascular;  Laterality: [name];  . LEG SURGERY Bilateral    lenghtening   . PACEMAKER PLACEMENT  04/13/2016   2nd deg HB after TAVR, pt had DC MDT PPM placed.  [name] SHOULDER ARTHROSCOPY  08/30/2011   Procedure: ARTHROSCOPY SHOULDER;  Surgeon: [name], MD;  Location: [place];  Service: Orthopedics;  Laterality: Right;  Right Shoulder Arthroscopy, Debridement, and Decompression  . SPINAL CORD STIMULATOR INSERTION [name] 09/10/2015   Procedure: LUMBAR SPINAL CORD STIMULATOR INSERTION;  Surgeon: [name], MD;  Location: [place] [facility]URO [facility]S;  Service: Neurosurgery;  Laterality: [name];  . 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  R big toe surg due to osteoarth  . TONSILLECTOMY    . traeculectomy Left    eye  . TRANSCATHETER AORTIC VALVE REPLACEMENT, TRANSFEMORAL  04/11/2016  . TRANSESOPHAGEAL ECHOCARDIOGRAM  03/09/2016   [facility]: EF 55-60%, PFO seen with bi-directional shunting, no thrombus in appendage.  . TRANSTHORACIC ECHOCARDIOGRAM  01/2015; 01/2016; 05/18/16   01/2015 No signif change in aortic stenosis (moderate).  01/2016 Severe LVH w/small LV cavity, EF 60-65%, grade I diast dysfxn.  05/2016 (s/p TAVR): EF 50-55%, grd I DD, biopros AV good.  [name] VITRECTOMY     Past Medical History:  Diagnosis Date  . ASTHMA   . BPH (benign prostatic hypertrophy)   . CAD (coronary artery disease)    Nonobstructive by 12/2012 cath; then 03/2016 he required BMS to RCA ([facility])  . CATARACT, HX OF   . Chronic combined systolic and diastolic CHF (congestive heart failure) ([place]) 05/2016   Mild; [facility] 61[facility]  . COPD   . Elevated transaminase level 02/2016   Suspect due to fatty liver (documented on u/s 2007). Plan repeat labs 03/2016.  .Marland KitchenEssential hypertension, benign   . Fatty liver 2007   u/s showed fatty liver with hepatosplenomegaly  . GERD (gastroesophageal reflux disease)   . Glaucoma   . GOUT   . Hallux valgus (acquired)   .  HH (hiatus hernia)   . HYPERCHOLESTEROLEMIA-PURE   . Hypogonadism male   . IFG (impaired fasting glucose)    HbA1c jumped from 5.7% to 6.1% 03/2015---started metformin at that time.  . Lumbosacral neuritis   . Lumbosacral spondylosis    Lumbar spinal stenosis with neurogenic claudication--contributes to his chronic pain syndrome  . Normal memory function 08/2014   Neuropsychological testing ([facility]): no cognitive impairment or sign of neurodegenerative disorder.  Likely has adjustment d/o with mixed anxiety/depressed features and may benefit from low dose SNRI.    .Marland KitchenNormocytic anemia 03/2016   Mild-pt needs ferritin and vit B12 level checked (as of 03/22/16)  . NSTEMI (non-ST elevated myocardial infarction) ([facility] 03/20/2016   BMS to RCA  . Obesity hypoventilation syndrome ([facility]   . Obesity, unspecified   .  Orthostatic hypotension   . OSA on CPAP    8 cm H2O  . OSTEOARTHRITIS   . Osteoarthrosis, unspecified whether generalized or localized, unspecified site   . Paroxysmal atrial fibrillation ([place]) 2003    (? chronic?) Off anticoag for a while due to falls.  Then apixaban started 12/2014.  [name] Peripheral neuropathy    Heredetary; with chronic neuropathic pain (Dr. Ella Bodo)  . Personal history of colonic adenoma 10/30/2012   Diminutive adenoma, consider repeat 2019 per GI  . PUD (peptic ulcer disease)   . PULMONARY HYPERTENSION, HX OF   . Secondary male hypogonadism 2017  . 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 Shortness of breath    with exertion: much improved s/p TAVR and treatment for CHF.  [name] Unspecified glaucoma(365.9)   . Unspecified hereditary and idiopathic peripheral neuropathy approx age 89   bilat LE's, ? left arm, too.  Feet became progressively numb + left foot pain intermittently.  Pt may be trying a spinal stimulator (as of 05/2015)  . VENOUS INSUFFICIENCY    Being followed by Dr. Sharol Given as  of 10/2016 for two R LL venous stasis ulcers/skin tears.  Healed as of 10/30/16 f/u with Dr. Sharol Given.  . VENTRAL HERNIA    BP (!) 146/83 (BP Location: Left Arm, Patient Position: Sitting, Cuff Size: Large)   Pulse 88   Resp 14   SpO2 94%   Opioid Risk Score:   Fall Risk Score:  `1  Depression screen PHQ 2/9  Depression screen [facility] 2/9 09/23/2015 07/15/2015 03/16/2015 02/16/2015 09/16/2014  Decreased Interest 0 0 0 0 1  Down, Depressed, Hopeless 0 0 0 0 0  PHQ - 2 Score 0 0 0 0 1  Altered sleeping - - - - 2  Tired, decreased energy - - - - 2  Change in appetite - - - - 1  Feeling bad or failure about yourself  - - - - 0  Trouble concentrating - - - - 0  Moving slowly or fidgety/restless - - - - 0  Suicidal thoughts - - - - 0  PHQ-9 Score - - - - 6  Some recent data might be hidden    Review of Systems  HENT: Negative.   Eyes: Negative.   Respiratory: Negative.   Cardiovascular: Negative.   Gastrointestinal: Negative.   Endocrine: Negative.   Genitourinary: Negative.   Musculoskeletal: Positive for back pain and gait problem.  Skin: Negative.   Allergic/Immunologic: Negative.   Neurological: Positive for tremors, weakness and numbness.  Hematological: Negative.   Psychiatric/Behavioral: Negative.   All other systems reviewed and are negative.      Objective:   Physical Exam  Constitutional: He is oriented to person, place, and time. He appears well-developed and well-nourished.  HENT:  Head: Normocephalic and atraumatic.  Neck: Normal range of motion. Neck supple.  Cardiovascular: Normal rate and regular rhythm.   Pulmonary/Chest: Effort normal and breath sounds normal.  Musculoskeletal:  Normal Muscle Bulk and Muscle Testing Reveals: Upper Extremities:Right: Decreased ROM  45 Degrees and Muscle Strength 5/5 Left:  Full ROM and Muscle Strength 5/5 Back without spinal tenderness noted Lower Extremities: Full ROM and Muscle Strength 5/5 Bilateral Velcro Wraps  Intact Arises from Table Slowly using Straight cane for Support Narrow Based Gait  Neurological: He is alert and oriented to person, place, and time.  Skin: Skin is warm and dry.  Psychiatric:  He has a normal mood and affect.  Nursing note and vitals reviewed.         Assessment & Plan:  1. 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 Continue Gabapentin and Tramadol. 11/28/2016 Refilled:Oxycodone 10 mg one tablet QID#120.  We will continue the opioid monitoring program, this consists of regular clinic visits, examinations, urine drug screen, pill counts as well as use of [place] Controlled Substance Reporting System. 2. DDD lumbar spine with LBP: Neurology Following. Continue with activity and heat therapy. 11/28/2016 S/P Permanent Spinal Stimulator Placed 09/10/15 via Dr. Maryjean Ka, with improvement of pain. 11/28/2016  20 minutes of face to face patient care time was spent during this visit. All questions were encouraged and answered.   F/U in 1 month

## 2016-12-17 ENCOUNTER — Other Ambulatory Visit: Payer: Self-pay | Admitting: Family Medicine

## 2016-12-18 NOTE — Telephone Encounter (Signed)
[facility] [address]  RF request for isosorbide LOV: 08/25/16 Next ov: None Last written: unknown  Please advise. Thanks.

## 2016-12-26 ENCOUNTER — Encounter: Payer: Medicare Other | Attending: Registered Nurse

## 2016-12-26 ENCOUNTER — Encounter: Payer: Self-pay | Admitting: Physical Medicine & Rehabilitation

## 2016-12-26 ENCOUNTER — Ambulatory Visit (HOSPITAL_BASED_OUTPATIENT_CLINIC_OR_DEPARTMENT_OTHER): Payer: Medicare Other | Admitting: Physical Medicine & Rehabilitation

## 2016-12-26 ENCOUNTER — Other Ambulatory Visit: Payer: Self-pay | Admitting: Family Medicine

## 2016-12-26 VITALS — BP 144/72 | HR 69 | Resp 14

## 2016-12-26 DIAGNOSIS — I2510 Atherosclerotic heart disease of native coronary artery without angina pectoris: Secondary | ICD-10-CM | POA: Diagnosis not present

## 2016-12-26 DIAGNOSIS — Z79899 Other long term (current) drug therapy: Secondary | ICD-10-CM

## 2016-12-26 DIAGNOSIS — J449 Chronic obstructive pulmonary disease, unspecified: Secondary | ICD-10-CM | POA: Diagnosis not present

## 2016-12-26 DIAGNOSIS — K219 Gastro-esophageal reflux disease without esophagitis: Secondary | ICD-10-CM | POA: Insufficient documentation

## 2016-12-26 DIAGNOSIS — Z5181 Encounter for therapeutic drug level monitoring: Secondary | ICD-10-CM | POA: Diagnosis not present

## 2016-12-26 DIAGNOSIS — M1990 Unspecified osteoarthritis, unspecified site: Secondary | ICD-10-CM | POA: Diagnosis not present

## 2016-12-26 DIAGNOSIS — E1140 Type 2 diabetes mellitus with diabetic neuropathy, unspecified: Secondary | ICD-10-CM

## 2016-12-26 DIAGNOSIS — G894 Chronic pain syndrome: Secondary | ICD-10-CM

## 2016-12-26 DIAGNOSIS — G609 Hereditary and idiopathic neuropathy, unspecified: Secondary | ICD-10-CM | POA: Diagnosis not present

## 2016-12-26 DIAGNOSIS — E7800 Pure hypercholesterolemia, unspecified: Secondary | ICD-10-CM | POA: Diagnosis not present

## 2016-12-26 DIAGNOSIS — M79672 Pain in left foot: Secondary | ICD-10-CM | POA: Diagnosis present

## 2016-12-26 DIAGNOSIS — M109 Gout, unspecified: Secondary | ICD-10-CM | POA: Diagnosis not present

## 2016-12-26 MED ORDER — TRAMADOL-ACETAMINOPHEN 37.5-325 MG PO TABS: 1.0000 | ORAL_TABLET | Freq: Three times a day (TID) | ORAL | 3 refills | Status: DC

## 2016-12-26 MED ORDER — OXYCODONE HCL 10 MG PO TABS: 10.0000 mg | ORAL_TABLET | Freq: Four times a day (QID) | ORAL | 0 refills | Status: DC

## 2016-12-26 MED ORDER — TORSEMIDE 20 MG PO TABS: 20.0000 mg | ORAL_TABLET | Freq: Every day | ORAL | 3 refills | Status: DC

## 2016-12-26 NOTE — Telephone Encounter (Signed)
[facility] [place].  RF request for torsemide LOV: 08/25/16 Next ov: None Last written: 06/19/16 #60 w/ 3RF  It was documented in pts lov to decrease to one 21m tablet torsemide daily. Will change sig to reflect this change.

## 2016-12-26 NOTE — Progress Notes (Signed)
Subjective:    Patient ID: Tim [name], male    DOB: 1941-02-08, 76 y.o.   MRN: [id number]  HPI  76 year old male with history of  peripheral neuropathy as well as lumbar spinal stenosis who has been complaining of chronic lower extremity pain for several years. Has undergone spinal cord stimulator placement by Dr. Lovenia Shuck.   Neuropathy pain in feet taking Oxycodone and gabapentin 3 times a day for the last 3 weeks  Was on 4 tablets a day prior to that time  Pacemaker visit one month ago with cardiology   TAVR Apr 11, 2016  Pain Inventory Average Pain 6 Pain Right Now 3 My pain is intermittent, constant, sharp, burning, dull, stabbing, tingling and aching  In the last 24 hours, has pain interfered with the following? General activity 8 Relation with others 6 Enjoyment of life 7 What TIME of day is your pain at its worst? night Sleep (in general) Fair  Pain is worse with: walking, bending, standing and some activites Pain improves with: rest, pacing activities, medication, TENS and injections Relief from Meds: 6  Mobility walk with assistance use a cane how many minutes can you walk? 5 ability to climb steps?  yes do you drive?  yes  Function retired I need assistance with the following:  meal prep, household duties and shopping Do you have any goals in this area?  no  Neuro/Psych weakness numbness tremor tingling trouble walking  Prior Studies Any changes since last visit?  no  Physicians involved in your care Any changes since last visit?  no   Family History  Problem Relation Age of Onset  . Hypertension Mother   . Coronary artery disease Mother   . Heart attack Mother   . Neuropathy Mother   . Pulmonary fibrosis Father        asbestosis   Social History   Social History  . Marital status: Married    Spouse name: [name]  . Number of children: 0  . Years of education: 20   Occupational History  . engineering     retired   Social History  Main Topics  . Smoking status: Never Smoker  . Smokeless tobacco: Never Used  . Alcohol use No  . Drug use: No  . Sexual activity: Not Currently   Other Topics Concern  . None   Social History Narrative   HSG, [name] - BS, [name] - MS-engineering, 2 years on PhD - [name]. Married - '65 - 81yr/divorced; '76- 3 yrs/divorced; '92 . No children. 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 ACP/Living Will - Yes CPR; long-term Mechanical ventilation as long as he was able to cognate; ok for long term artificial nutrition. Precondition being able to cognate and not to have too much pain.    Past Surgical History:  Procedure Laterality Date  . AMPUTATION Left 04/11/2013   Procedure: AMPUTATION DIGIT Left 3rd toe;  Surgeon: [name] MD;  [place]: [place]  Service: Orthopedics;  Laterality: Left;  Left 3rd toe amputation at [facility]  . CARDIAC CATHETERIZATION  1997; 03/10/16   1997 Non-obstructive disease.  03/2016 BMS to RCA, with 25% pDiag dz, o/w normal cors per cath 03/07/16.  .Marland KitchenCARDIAC CATHETERIZATION  12/24/2012   mild < 20% LCx, prox 30% RCA; LVEF 55-65% , moderate pulmonary HTN, moderate AS  . CARDIOVASCULAR STRESS TEST  05/11/16 ([facility])   Myocardial perfusion imaging:  No ischemia; scar in apex, global hypokinesis, EF  36%.  . Carotid dopplers  03/09/2016   [facility]: no hemodynamically significant stenosis on either side.  . CHOLECYSTECTOMY    . COLONOSCOPY    . COLONOSCOPY [name] 10/30/2012   Procedure: COLONOSCOPY;  Surgeon: [name], MD;  [facility];  Service: Endoscopy;  Laterality: [name];  . CORONARY ANGIOPLASTY WITH STENT PLACEMENT  03/2016   [facility]: BMS to RCA-pt was placed on Brilinta.  [name] EYE SURGERY Bilateral cataract  . HEMORRHOID SURGERY    . INTRAOCULAR LENS INSERTION Bilateral   . KNEE SURGERY Right   . LEFT AND RIGHT HEART CATHETERIZATION WITH CORONARY ANGIOGRAM [name] 12/24/2012   Procedure: LEFT AND RIGHT HEART CATHETERIZATION WITH  CORONARY ANGIOGRAM;  Surgeon: [name], MD;  Location: [facility] C[facility] LAB;  Service: Cardiovascular;  Laterality: [name];  . LEG SURGERY Bilateral    lenghtening   . PACEMAKER PLACEMENT  04/13/2016   2nd deg HB after TAVR, pt had DC MDT PPM placed.  [name] SHOULDER ARTHROSCOPY  08/30/2011   Procedure: ARTHROSCOPY SHOULDER;  Surgeon: [name], MD;  Location: [place];  Service: Orthopedics;  Laterality: Right;  Right Shoulder Arthroscopy, Debridement, and Decompression  . SPINAL CORD STIMULATOR INSERTION [name] 09/10/2015   Procedure: LUMBAR SPINAL CORD STIMULATOR INSERTION;  Surgeon: [name], MD;  Location: [place] [facility]O [facility];  Service: Neurosurgery;  Laterality: [name];  . 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  R big toe surg due to osteoarth  . TONSILLECTOMY    . traeculectomy Left    eye  . TRANSCATHETER AORTIC VALVE REPLACEMENT, TRANSFEMORAL  04/11/2016  . TRANSESOPHAGEAL ECHOCARDIOGRAM  03/09/2016   [facility]: EF 55-60%, PFO seen with bi-directional shunting, no thrombus in appendage.  . TRANSTHORACIC ECHOCARDIOGRAM  01/2015; 01/2016; 05/18/16   01/2015 No signif change in aortic stenosis (moderate).  01/2016 Severe LVH w/small LV cavity, EF 60-65%, grade I diast dysfxn.  05/2016 (s/p TAVR): EF 50-55%, grd I DD, biopros AV good.  [name] VITRECTOMY     Past Medical History:  Diagnosis Date  . ASTHMA   . BPH (benign prostatic hypertrophy)   . CAD (coronary artery disease)    Nonobstructive by 12/2012 cath; then 03/2016 he required BMS to RCA ([facility])  . CATARACT, HX OF   . Chronic combined systolic and diastolic CHF (congestive heart failure) ([place]) 05/2016   Mild; [facility]  . COPD   . Elevated transaminase level 02/2016   Suspect due to fatty liver (documented on u/s 2007). Plan repeat labs 03/2016.  .Marland KitchenEssential hypertension, benign   . Fatty liver 2007   u/s showed fatty liver with hepatosplenomegaly  . GERD (gastroesophageal reflux disease)   . Glaucoma    . GOUT   . Hallux valgus (acquired)   . HH (hiatus hernia)   . HYPERCHOLESTEROLEMIA-PURE   . Hypogonadism male   . IFG (impaired fasting glucose)    HbA1c jumped from 5.7% to 6.1% 03/2015---started metformin at that time.  . Lumbosacral neuritis   . Lumbosacral spondylosis    Lumbar spinal stenosis with neurogenic claudication--contributes to his chronic pain syndrome  . Normal memory function 08/2014   Neuropsychological testing ([facility]): no cognitive impairment or sign of neurodegenerative disorder.  Likely has adjustment d/o with mixed anxiety/depressed features and may benefit from low dose SNRI.    .Marland KitchenNormocytic anemia 03/2016   Mild-pt needs ferritin and vit B12 level checked (as of 03/22/16)  . NSTEMI (non-ST elevated myocardial infarction) ([facility] 03/20/2016  BMS to RCA  . Obesity hypoventilation syndrome ([place])   . Obesity, unspecified   . Orthostatic hypotension   . OSA on CPAP    8 cm H2O  . OSTEOARTHRITIS   . Osteoarthrosis, unspecified whether generalized or localized, unspecified site   . Paroxysmal atrial fibrillation ([place]) 2003    (? chronic?) Off anticoag for a while due to falls.  Then apixaban started 12/2014.  [name] Peripheral neuropathy    Heredetary; with chronic neuropathic pain (Dr. Ella Bodo)  . Personal history of colonic adenoma 10/30/2012   Diminutive adenoma, consider repeat 2019 per GI  . PUD (peptic ulcer disease)   . PULMONARY HYPERTENSION, HX OF   . Secondary male hypogonadism 2017  . 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 Shortness of breath    with exertion: much improved s/p TAVR and treatment for CHF.  [name] Unspecified glaucoma(365.9)   . Unspecified hereditary and idiopathic peripheral neuropathy approx age 55   bilat LE's, ? left arm, too.  Feet became progressively numb + left foot pain intermittently.  Pt may be trying a spinal stimulator (as of 05/2015)  . VENOUS  INSUFFICIENCY    Being followed by Dr. Sharol Given as of 10/2016 for two R LL venous stasis ulcers/skin tears.  Healed as of 10/30/16 f/u with Dr. Sharol Given.  . VENTRAL HERNIA    BP (!) 144/72 (BP Location: Right Arm, Patient Position: Sitting, Cuff Size: Large)   Pulse 69   Resp 14   SpO2 96%   Opioid Risk Score:   Fall Risk Score:  `1  Depression screen PHQ 2/9  Depression screen [facility] 2/9 09/23/2015 07/15/2015 03/16/2015 02/16/2015 09/16/2014  Decreased Interest 0 0 0 0 1  Down, Depressed, Hopeless 0 0 0 0 0  PHQ - 2 Score 0 0 0 0 1  Altered sleeping - - - - 2  Tired, decreased energy - - - - 2  Change in appetite - - - - 1  Feeling bad or failure about yourself  - - - - 0  Trouble concentrating - - - - 0  Moving slowly or fidgety/restless - - - - 0  Suicidal thoughts - - - - 0  PHQ-9 Score - - - - 6  Some recent data might be hidden    Review of Systems  HENT: Negative.   Eyes: Negative.   Respiratory: Negative.   Cardiovascular: Negative.   Gastrointestinal: Negative.   Endocrine: Negative.   Genitourinary: Negative.   Musculoskeletal: Positive for back pain and gait problem.  Skin: Negative.   Allergic/Immunologic: Negative.   Neurological: Positive for tremors, weakness and numbness.       Tingling  Hematological: Negative.   Psychiatric/Behavioral: Negative.        Objective:   Physical Exam  Constitutional: He is oriented to person, place, and time. He appears well-developed and well-nourished.  HENT:  Head: Normocephalic and atraumatic.  Eyes: Conjunctivae and EOM are normal. Pupils are equal, round, and reactive to light.  Musculoskeletal:  Lumbar range of motion 45 flexion, degrees extension, 10 lateral bending. Mild tenderness palpation lumbar paraspinal muscles  Neurological: He is alert and oriented to person, place, and time.  Psychiatric: He has a normal mood and affect.  Nursing note and vitals reviewed.  Status post great toe amputation, left side, skin  shows no evidence of breakdown in the feet. There is stasis dermatitis changes bilateral pretibial  area. No pain with hip or knee range of motion. He has proprioception at the ankles and knees but not at the toes Absent light touch and pinprick sensation below the knees bilaterally       Assessment & Plan:  1. Chronic neuropathic pain, bilateral lower extremities. Continue gabapentin 600 mg 4 times per day Oxycodone 10 mg 4 times per day  Continue opioid monitoring program. This consists of regular clinic visits, examinations, urine drug screen, pill counts as well as use of [place] controlled substance reporting System. Last UDS 10/24/2016. Oxycodone and metabolites listed as unexpected. However, this is a chronic medication.  Nurse practitioner. Visit 1 month

## 2017-01-02 ENCOUNTER — Other Ambulatory Visit: Payer: Self-pay

## 2017-01-02 ENCOUNTER — Other Ambulatory Visit: Payer: Self-pay | Admitting: Cardiology

## 2017-01-02 DIAGNOSIS — I4891 Unspecified atrial fibrillation: Secondary | ICD-10-CM

## 2017-01-02 LAB — DRUG TOX MONITOR 1 W/CONF, ORAL FLD
Amphetamines: NEGATIVE ng/mL (ref <10)
BENZODIAZEPINES: NEGATIVE ng/mL (ref <0.50)
Barbiturates: NEGATIVE ng/mL (ref <10)
Buprenorphine: NEGATIVE ng/mL (ref <0.025)
COCAINE: NEGATIVE ng/mL (ref <2.5)
Codeine: NEGATIVE ng/mL (ref <2.5)
DIHYDROCODEINE: NEGATIVE ng/mL (ref <2.5)
FENTANYL: NEGATIVE ng/mL (ref <0.10)
HYDROCODONE: NEGATIVE ng/mL (ref <2.5)
Heroin Metabolite: NEGATIVE ng/mL (ref <1.0)
Hydromorphone: NEGATIVE ng/mL (ref <2.5)
MARIJUANA: NEGATIVE ng/mL (ref <2.5)
MDMA: NEGATIVE ng/mL (ref <10)
MEPROBAMATE: NEGATIVE ng/mL (ref <2.5)
METHADONE: NEGATIVE ng/mL (ref <5.0)
MORPHINE: NEGATIVE ng/mL (ref <2.5)
Meperidine: NEGATIVE ng/mL (ref <5.0)
NORHYDROCODONE: NEGATIVE ng/mL (ref <2.5)
Nicotine Metabolite: NEGATIVE ng/mL (ref <5.0)
Noroxycodone: 11 ng/mL — ABNORMAL HIGH (ref <2.5)
Opiates: POSITIVE ng/mL — AB (ref <2.5)
Oxycodone: 52 ng/mL — ABNORMAL HIGH (ref <2.5)
Oxymorphone: NEGATIVE ng/mL (ref <2.5)
PROPOXYPHENE: NEGATIVE ng/mL (ref <5.0)
Phencyclidine: NEGATIVE ng/mL (ref <10)
TAPENTADOL: NEGATIVE ng/mL (ref <5.0)
TRAMADOL: POSITIVE ng/mL — AB (ref <5.0)
Tramadol: >500 ng/mL — ABNORMAL HIGH (ref <5.0)
Zolpidem: NEGATIVE ng/mL (ref <5.0)

## 2017-01-02 LAB — DRUG TOX ALC METAB W/CON, ORAL FLD: ALCOHOL METABOLITE: NEGATIVE ng/mL (ref <25)

## 2017-01-09 ENCOUNTER — Telehealth: Payer: Self-pay | Admitting: *Deleted

## 2017-01-09 NOTE — Telephone Encounter (Signed)
Oral swab drug screen for this encounter is consistent for prescribed medications.

## 2017-01-18 ENCOUNTER — Other Ambulatory Visit: Payer: Self-pay | Admitting: Cardiology

## 2017-01-18 ENCOUNTER — Other Ambulatory Visit: Payer: Self-pay | Admitting: Family Medicine

## 2017-01-18 NOTE — Telephone Encounter (Signed)
REFILL 

## 2017-01-19 NOTE — Telephone Encounter (Signed)
[name]   RF request for finasteride LOV: 08/25/16 Next ov: None Last written: 07/07/16 #90 w/ 1RF  Will send Rx for #90 w/ 0RF. Pt is due for f/u [facility], needs office visit for more refills.

## 2017-01-22 NOTE — Telephone Encounter (Signed)
Left message for pt to call back  °

## 2017-01-22 NOTE — Telephone Encounter (Signed)
Patient notified and verbalized understanding. He scheduled an appointment with Diane for follow up and wellness visit with Health coach.

## 2017-01-23 ENCOUNTER — Other Ambulatory Visit: Payer: Self-pay | Admitting: Registered Nurse

## 2017-01-24 NOTE — Telephone Encounter (Signed)
Pulled [facility] for patient, last fill date and last script writing date different, called patient to verify if he had additional paper script, no answer, left message for him to call us back

## 2017-01-24 NOTE — Telephone Encounter (Signed)
Called a second time to speak with Tim [name] but no answer. Left message for him to call us to clarify why he needs rx if the 12/26/16 Rx does not show filled in [place].

## 2017-01-25 ENCOUNTER — Encounter: Payer: Medicare Other | Attending: Registered Nurse | Admitting: Registered Nurse

## 2017-01-25 ENCOUNTER — Encounter: Payer: Self-pay | Admitting: Registered Nurse

## 2017-01-25 VITALS — BP 141/86 | HR 82

## 2017-01-25 DIAGNOSIS — G609 Hereditary and idiopathic neuropathy, unspecified: Secondary | ICD-10-CM | POA: Insufficient documentation

## 2017-01-25 DIAGNOSIS — E1140 Type 2 diabetes mellitus with diabetic neuropathy, unspecified: Secondary | ICD-10-CM

## 2017-01-25 DIAGNOSIS — Z5181 Encounter for therapeutic drug level monitoring: Secondary | ICD-10-CM

## 2017-01-25 DIAGNOSIS — G894 Chronic pain syndrome: Secondary | ICD-10-CM

## 2017-01-25 DIAGNOSIS — I2510 Atherosclerotic heart disease of native coronary artery without angina pectoris: Secondary | ICD-10-CM | POA: Diagnosis not present

## 2017-01-25 DIAGNOSIS — K219 Gastro-esophageal reflux disease without esophagitis: Secondary | ICD-10-CM | POA: Insufficient documentation

## 2017-01-25 DIAGNOSIS — M1990 Unspecified osteoarthritis, unspecified site: Secondary | ICD-10-CM | POA: Insufficient documentation

## 2017-01-25 DIAGNOSIS — M48062 Spinal stenosis, lumbar region with neurogenic claudication: Secondary | ICD-10-CM | POA: Diagnosis not present

## 2017-01-25 DIAGNOSIS — Z79899 Other long term (current) drug therapy: Secondary | ICD-10-CM

## 2017-01-25 DIAGNOSIS — E7800 Pure hypercholesterolemia, unspecified: Secondary | ICD-10-CM | POA: Insufficient documentation

## 2017-01-25 DIAGNOSIS — M79672 Pain in left foot: Secondary | ICD-10-CM | POA: Diagnosis present

## 2017-01-25 DIAGNOSIS — J449 Chronic obstructive pulmonary disease, unspecified: Secondary | ICD-10-CM | POA: Insufficient documentation

## 2017-01-25 DIAGNOSIS — M109 Gout, unspecified: Secondary | ICD-10-CM | POA: Insufficient documentation

## 2017-01-25 MED ORDER — GABAPENTIN 600 MG PO TABS: 600.0000 mg | ORAL_TABLET | Freq: Four times a day (QID) | ORAL | 3 refills | Status: DC

## 2017-01-25 NOTE — Telephone Encounter (Signed)
Patient took already written script to pharmacy today

## 2017-01-25 NOTE — Progress Notes (Signed)
Subjective:    Patient ID: Tim [name], male    DOB: 09-15-1940, 76 y.o.   MRN: [id number]  HPI: Tim [name] is a 76year old male who returns for follow upappointmentfor chronic pain and medication refill. He states his pain is located in his lower back and left foot.He rates his pain 4. His current exercise regime is walking and stationary bike for 30 minutes every other day.   His last UDS was on 12/26/2016 it was consistent.   Pain Inventory Average Pain 6 Pain Right Now 4 My pain is intermittent, sharp, dull, stabbing, tingling and aching  In the last 24 hours, has pain interfered with the following? General activity 3 Relation with others 7 Enjoyment of life 7 What TIME of day is your pain at its worst? night Sleep (in general) Poor  Pain is worse with: walking, bending, standing and some activites Pain improves with: rest, pacing activities, medication, TENS and injections Relief from Meds: 4  Mobility use a cane how many minutes can you walk? 3 ability to climb steps?  yes do you drive?  yes  Function retired I need assistance with the following:  household duties and shopping  Neuro/Psych weakness numbness tremor tingling trouble walking  Prior Studies Any changes since last visit?  no  Physicians involved in your care Any changes since last visit?  no   Family History  Problem Relation Age of Onset  . Hypertension Mother   . Coronary artery disease Mother   . Heart attack Mother   . Neuropathy Mother   . Pulmonary fibrosis Father        asbestosis   Social History   Social History  . Marital status: Married    Spouse name: [name]  . Number of children: 0  . Years of education: 20   Occupational History  . engineering     retired   Social History Main Topics  . Smoking status: Never Smoker  . Smokeless tobacco: Never Used  . Alcohol use No  . Drug use: No  . Sexual activity: Not Currently   Other Topics Concern  .  None   Social History Narrative   HSG, [name] - BS, [name] - MS-engineering, 2 years on PhD - [name]. Married - '65 - 2yr/divorced; '76- 3 yrs/divorced; '92 . No children. 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 ACP/Living Will - Yes CPR; long-term Mechanical ventilation as long as he was able to cognate; ok for long term artificial nutrition. Precondition being able to cognate and not to have too much pain.    Past Surgical History:  Procedure Laterality Date  . AMPUTATION Left 04/11/2013   Procedure: AMPUTATION DIGIT Left 3rd toe;  Surgeon: [name] MD;  [facility]  Service: Orthopedics;  Laterality: Left;  Left 3rd toe amputation at [facility]  . CARDIAC CATHETERIZATION  1997; 03/10/16   1997 Non-obstructive disease.  03/2016 BMS to RCA, with 25% pDiag dz, o/w normal cors per cath 03/07/16.  .Marland KitchenCARDIAC CATHETERIZATION  12/24/2012   mild < 20% LCx, prox 30% RCA; LVEF 55-65% , moderate pulmonary HTN, moderate AS  . CARDIOVASCULAR STRESS TEST  05/11/16 ([facility])   Myocardial perfusion imaging:  No ischemia; scar in apex, global hypokinesis, EF 36%.  . Carotid dopplers  03/09/2016   [facility]: no hemodynamically significant stenosis on either side.  . CHOLECYSTECTOMY    . COLONOSCOPY    . COLONOSCOPY [name] 10/30/2012  Procedure: COLONOSCOPY;  Surgeon: [name], MD;  [facility];  Service: Endoscopy;  Laterality: [name];  . CORONARY ANGIOPLASTY WITH STENT PLACEMENT  03/2016   [facility]: BMS to RCA-pt was placed on Brilinta.  [name] EYE SURGERY Bilateral cataract  . HEMORRHOID SURGERY    . INTRAOCULAR LENS INSERTION Bilateral   . KNEE SURGERY Right   . LEFT AND RIGHT HEART CATHETERIZATION WITH CORONARY ANGIOGRAM [name] 12/24/2012   Procedure: LEFT AND RIGHT HEART CATHETERIZATION WITH CORONARY ANGIOGRAM;  Surgeon: [name], MD;  Location: [facility] C[facility] LAB;  Service: Cardiovascular;  Laterality: [name];  . LEG SURGERY Bilateral    lenghtening   . PACEMAKER  PLACEMENT  04/13/2016   2nd deg HB after TAVR, pt had DC MDT PPM placed.  [name] SHOULDER ARTHROSCOPY  08/30/2011   Procedure: ARTHROSCOPY SHOULDER;  Surgeon: [name], MD;  Location: [place];  Service: Orthopedics;  Laterality: Right;  Right Shoulder Arthroscopy, Debridement, and Decompression  . SPINAL CORD STIMULATOR INSERTION [name] 09/10/2015   Procedure: LUMBAR SPINAL CORD STIMULATOR INSERTION;  Surgeon: [name], MD;  Location: [place] NEURO ORS;  Service: Neurosurgery;  Laterality: [name];  . 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  R big toe surg due to osteoarth  . TONSILLECTOMY    . traeculectomy Left    eye  . TRANSCATHETER AORTIC VALVE REPLACEMENT, TRANSFEMORAL  04/11/2016  . TRANSESOPHAGEAL ECHOCARDIOGRAM  03/09/2016   [facility]: EF 55-60%, PFO seen with bi-directional shunting, no thrombus in appendage.  . TRANSTHORACIC ECHOCARDIOGRAM  01/2015; 01/2016; 05/18/16   01/2015 No signif change in aortic stenosis (moderate).  01/2016 Severe LVH w/small LV cavity, EF 60-65%, grade I diast dysfxn.  05/2016 (s/p TAVR): EF 50-55%, grd I DD, biopros AV good.  [name] VITRECTOMY     Past Medical History:  Diagnosis Date  . ASTHMA   . BPH (benign prostatic hypertrophy)   . CAD (coronary artery disease)    Nonobstructive by 12/2012 cath; then 03/2016 he required BMS to RCA ([facility])  . CATARACT, HX OF   . Chronic combined systolic and diastolic CHF (congestive heart failure) ([place]) 05/2016   Mild; [facility]  . COPD   . Elevated transaminase level 02/2016   Suspect due to fatty liver (documented on u/s 2007). Plan repeat labs 03/2016.  .Marland KitchenEssential hypertension, benign   . Fatty liver 2007   u/s showed fatty liver with hepatosplenomegaly  . GERD (gastroesophageal reflux disease)   . Glaucoma   . GOUT   . Hallux valgus (acquired)   . HH (hiatus hernia)   . HYPERCHOLESTEROLEMIA-PURE   . Hypogonadism male   . IFG (impaired fasting glucose)    HbA1c jumped from  5.7% to 6.1% 03/2015---started metformin at that time.  . Lumbosacral neuritis   . Lumbosacral spondylosis    Lumbar spinal stenosis with neurogenic claudication--contributes to his chronic pain syndrome  . Normal memory function 08/2014   Neuropsychological testing ([facility]): no cognitive impairment or sign of neurodegenerative disorder.  Likely has adjustment d/o with mixed anxiety/depressed features and may benefit from low dose SNRI.    .Marland KitchenNormocytic anemia 03/2016   Mild-pt needs ferritin and vit B12 level checked (as of 03/22/16)  . NSTEMI (non-ST elevated myocardial infarction) ([place] 03/20/2016   BMS to RCA  . Obesity hypoventilation syndrome ([place]   . Obesity, unspecified   . Orthostatic hypotension   . OSA on CPAP    8 cm H2O  .  OSTEOARTHRITIS   . Osteoarthrosis, unspecified whether generalized or localized, unspecified site   . Paroxysmal atrial fibrillation ([place]) 2003    (? chronic?) Off anticoag for a while due to falls.  Then apixaban started 12/2014.  [name] Peripheral neuropathy    Heredetary; with chronic neuropathic pain (Dr. Ella Bodo)  . Personal history of colonic adenoma 10/30/2012   Diminutive adenoma, consider repeat 2019 per GI  . PUD (peptic ulcer disease)   . PULMONARY HYPERTENSION, HX OF   . Secondary male hypogonadism 2017  . 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 Shortness of breath    with exertion: much improved s/p TAVR and treatment for CHF.  [name] Unspecified glaucoma(365.9)   . Unspecified hereditary and idiopathic peripheral neuropathy approx age 50   bilat LE's, ? left arm, too.  Feet became progressively numb + left foot pain intermittently.  Pt may be trying a spinal stimulator (as of 05/2015)  . VENOUS INSUFFICIENCY    Being followed by Dr. Sharol Given as of 10/2016 for two R LL venous stasis ulcers/skin tears.  Healed as of 10/30/16 f/u with Dr. Sharol Given.  . VENTRAL HERNIA    BP (!)  141/86   Pulse 82   SpO2 96%   Opioid Risk Score:  1 Fall Risk Score:  `1  Depression screen PHQ 2/9  Depression screen [facility] 2/9 01/25/2017 09/23/2015 07/15/2015 03/16/2015 02/16/2015 09/16/2014  Decreased Interest 0 0 0 0 0 1  Down, Depressed, Hopeless 0 0 0 0 0 0  PHQ - 2 Score 0 0 0 0 0 1  Altered sleeping - - - - - 2  Tired, decreased energy - - - - - 2  Change in appetite - - - - - 1  Feeling bad or failure about yourself  - - - - - 0  Trouble concentrating - - - - - 0  Moving slowly or fidgety/restless - - - - - 0  Suicidal thoughts - - - - - 0  PHQ-9 Score - - - - - 6  Some recent data might be hidden    Review of Systems  HENT: Negative.   Eyes: Negative.   Respiratory: Negative.   Cardiovascular: Negative.   Gastrointestinal: Negative.   Endocrine: Negative.   Genitourinary: Negative.   Musculoskeletal: Positive for gait problem.  Skin: Negative.   Neurological: Positive for tremors, weakness and numbness.       Tingling  Hematological: Negative.   Psychiatric/Behavioral: Negative.   All other systems reviewed and are negative.      Objective:   Physical Exam  Constitutional: He is oriented to person, place, and time. He appears well-developed and well-nourished.  HENT:  Head: Normocephalic and atraumatic.  Neck: Normal range of motion. Neck supple.  Cardiovascular: Normal rate and regular rhythm.   Pulmonary/Chest: Effort normal and breath sounds normal.  Musculoskeletal:  Normal Muscle Bulk and Muscle Testing Reveals: Upper Extremities: Right: Decreased ROM 45 Degrees and Muscle Strength 5/5 Left: Full ROM and Muscle Strength 5/5 Thoracic Paraspinal Tenderness: T-7-T-9 Mainly Left Side Lower Extremities: Full ROM and Muscle Strength 5/5 Arises from chair slowly using straight cane for support Narrow Based Gait  Neurological: He is alert and oriented to person, place, and time.  Skin: Skin is warm and dry.  Psychiatric: He has a normal mood and affect.    Nursing note and vitals reviewed.  Assessment & Plan:  1. 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 Continue Gabapentin and Tramadol. 01/25/2017 Continue:Oxycodone 10 mg one tablet QID#120.  We will continue the opioid monitoring program, this consists of regular clinic visits, examinations, urine drug screen, pill counts as well as use of [place] Controlled Substance Reporting System. 2. DDD lumbar spine with LBP: Neurology Following. Continue with activity and heat therapy. 01/25/2017 S/P Permanent Spinal Stimulator Placed 09/10/15 via Dr. Maryjean Ka, with improvement of pain.  20 minutes of face to face patient care time was spent during this visit. All questions were encouraged and answered.   F/U in 1 month

## 2017-02-03 ENCOUNTER — Other Ambulatory Visit: Payer: Self-pay | Admitting: Cardiology

## 2017-02-05 NOTE — Telephone Encounter (Signed)
REFILL 

## 2017-02-05 NOTE — Progress Notes (Deleted)
Subjective:   Tim [name] is a 76 y.o. male who presents for Medicare Annual/Subsequent preventive examination.  Review of Systems:  No ROS.  Medicare Wellness Visit. Additional risk factors are reflected in the social history.    Sleep patterns:  Home Safety/Smoke Alarms: Feels safe in home. Smoke alarms in place.  Living environment; residence and Firearm Safety:  [place] Safety/Bike Helmet: Wears seat belt.   Counseling:   Eye Exam-  Dental-  Male:   CCS-Colonoscopy 10/30/2012, polyp. Recall 5 years.     PSA-  Lab Results  Component Value Date   PSA 0.23 11/23/2015   PSA 0.33 10/07/2014   PSA 0.40 09/25/2013       Objective:    Vitals: There were no vitals taken for this visit.  There is no height or weight on file to calculate BMI.  Tobacco History  Smoking Status  . Never Smoker  Smokeless Tobacco  . Never Used     Counseling given: Not Answered   Past Medical History:  Diagnosis Date  . ASTHMA   . BPH (benign prostatic hypertrophy)   . CAD (coronary artery disease)    Nonobstructive by 12/2012 cath; then 03/2016 he required BMS to RCA ([facility])  . CATARACT, HX OF   . Chronic combined systolic and diastolic CHF (congestive heart failure) ([place]) 05/2016   Mild; [facility]  . COPD   . Elevated transaminase level 02/2016   Suspect due to fatty liver (documented on u/s 2007). Plan repeat labs 03/2016.  .Marland KitchenEssential hypertension, benign   . Fatty liver 2007   u/s showed fatty liver with hepatosplenomegaly  . GERD (gastroesophageal reflux disease)   . Glaucoma   . GOUT   . Hallux valgus (acquired)   . HH (hiatus hernia)   . HYPERCHOLESTEROLEMIA-PURE   . Hypogonadism male   . IFG (impaired fasting glucose)    HbA1c jumped from 5.7% to 6.1% 03/2015---started metformin at that time.  . Lumbosacral neuritis   . Lumbosacral spondylosis    Lumbar spinal stenosis with neurogenic claudication--contributes to his  chronic pain syndrome  . Normal memory function 08/2014   Neuropsychological testing ([facility]): no cognitive impairment or sign of neurodegenerative disorder.  Likely has adjustment d/o with mixed anxiety/depressed features and may benefit from low dose SNRI.    .Marland KitchenNormocytic anemia 03/2016   Mild-pt needs ferritin and vit B12 level checked (as of 03/22/16)  . NSTEMI (non-ST elevated myocardial infarction) ([place] 03/20/2016   BMS to RCA  . Obesity hypoventilation syndrome ([place]   . Obesity, unspecified   . Orthostatic hypotension   . OSA on CPAP    8 cm H2O  . OSTEOARTHRITIS   . Osteoarthrosis, unspecified whether generalized or localized, unspecified site   . Paroxysmal atrial fibrillation ([place] 2003    (? chronic?) Off anticoag for a while due to falls.  Then apixaban started 12/2014.  .Marland KitchenPeripheral neuropathy    Heredetary; with chronic neuropathic pain (Dr. KElla Bodo  . Personal history of colonic adenoma 10/30/2012   Diminutive adenoma, consider repeat 2019 per GI  . PUD (peptic ulcer disease)   . PULMONARY HYPERTENSION, HX OF   . Secondary male hypogonadism 2017  . 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 Shortness of breath    with exertion: much improved s/p TAVR and treatment for CHF.  .Marland KitchenUnspecified glaucoma(365.9)   .  Unspecified hereditary and idiopathic peripheral neuropathy approx age 7   bilat LE's, ? left arm, too.  Feet became progressively numb + left foot pain intermittently.  Pt may be trying a spinal stimulator (as of 05/2015)  . VENOUS INSUFFICIENCY    Being followed by Dr. Sharol Given as of 10/2016 for two R LL venous stasis ulcers/skin tears.  Healed as of 10/30/16 f/u with Dr. Sharol Given.  . VENTRAL HERNIA    Past Surgical History:  Procedure Laterality Date  . AMPUTATION Left 04/11/2013   Procedure: AMPUTATION DIGIT Left 3rd toe;  Surgeon: [name], MD;  Location: [place];  Service:  Orthopedics;  Laterality: Left;  Left 3rd toe amputation at [facility]  . CARDIAC CATHETERIZATION  1997; 03/10/16   1997 Non-obstructive disease.  03/2016 BMS to RCA, with 25% pDiag dz, o/w normal cors per cath 03/07/16.  [name] CARDIAC CATHETERIZATION  12/24/2012   mild < 20% LCx, prox 30% RCA; LVEF 55-65% , moderate pulmonary HTN, moderate AS  . CARDIOVASCULAR STRESS TEST  05/11/16 ([facility])   Myocardial perfusion imaging:  No ischemia; scar in apex, global hypokinesis, EF 36%.  . Carotid dopplers  03/09/2016   [facility]: no hemodynamically significant stenosis on either side.  . CHOLECYSTECTOMY    . COLONOSCOPY    . COLONOSCOPY [name] 10/30/2012   Procedure: COLONOSCOPY;  Surgeon: [name], MD;  [facility];  Service: Endoscopy;  Laterality: [name];  . CORONARY ANGIOPLASTY WITH STENT PLACEMENT  03/2016   [facility]: BMS to RCA-pt was placed on Brilinta.  [name] EYE SURGERY Bilateral cataract  . HEMORRHOID SURGERY    . INTRAOCULAR LENS INSERTION Bilateral   . KNEE SURGERY Right   . LEFT AND RIGHT HEART CATHETERIZATION WITH CORONARY ANGIOGRAM [name] 12/24/2012   Procedure: LEFT AND RIGHT HEART CATHETERIZATION WITH CORONARY ANGIOGRAM;  Surgeon: [name], MD;  Location: [facility] CATH LAB;  Service: Cardiovascular;  Laterality: [name];  . LEG SURGERY Bilateral    lenghtening   . PACEMAKER PLACEMENT  04/13/2016   2nd deg HB after TAVR, pt had DC MDT PPM placed.  [name] SHOULDER ARTHROSCOPY  08/30/2011   Procedure: ARTHROSCOPY SHOULDER;  Surgeon: [name], MD;  Location: [place];  Service: Orthopedics;  Laterality: Right;  Right Shoulder Arthroscopy, Debridement, and Decompression  . SPINAL CORD STIMULATOR INSERTION [name] 09/10/2015   Procedure: LUMBAR SPINAL CORD STIMULATOR INSERTION;  Surgeon: [name], MD;  Location: [place] [facility]S;  Service: Neurosurgery;  Laterality: [name];  . 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  R big toe surg due to osteoarth  . TONSILLECTOMY    . traeculectomy Left    eye  .  TRANSCATHETER AORTIC VALVE REPLACEMENT, TRANSFEMORAL  04/11/2016  . TRANSESOPHAGEAL ECHOCARDIOGRAM  03/09/2016   [facility]: EF 55-60%, PFO seen with bi-directional shunting, no thrombus in appendage.  . TRANSTHORACIC ECHOCARDIOGRAM  01/2015; 01/2016; 05/18/16   01/2015 No signif change in aortic stenosis (moderate).  01/2016 Severe LVH w/small LV cavity, EF 60-65%, grade I diast dysfxn.  05/2016 (s/p TAVR): EF 50-55%, grd I DD, biopros AV good.  [name] VITRECTOMY     Family History  Problem Relation Age of Onset  . Hypertension Mother   . Coronary artery disease Mother   . Heart attack Mother   . Neuropathy Mother   . Pulmonary fibrosis Father        asbestosis   History  Sexual Activity  . Sexual activity: Not Currently    Outpatient Encounter Prescriptions as of  02/06/2017  Medication Sig  . albuterol (PROVENTIL HFA;VENTOLIN HFA) 108 (90 BASE) MCG/ACT inhaler Inhale 2 puffs into the lungs 4 (four) times daily as needed for wheezing or shortness of breath.  . allopurinol (ZYLOPRIM) 300 MG tablet take 1 tablet by mouth once daily with food  . apixaban (ELIQUIS) 5 MG TABS tablet Take 1 tablet (5 mg total) by mouth 2 (two) times daily.  [name] aspirin EC 81 MG tablet Take by mouth.  . B Complex-C (B-COMPLEX WITH VITAMIN C) tablet Take 1 tablet by mouth daily.  . cadexomer iodine (IODOSORB) 0.9 % gel Apply 1 application topically daily as needed for wound care.  . clotrimazole-betamethasone (LOTRISONE) cream apply to affected area twice a day  . ezetimibe (ZETIA) 10 MG tablet take 1 tablet by mouth once daily  . finasteride (PROSCAR) 5 MG tablet take 1 tablet by mouth once daily  . fluticasone (FLONASE) 50 MCG/ACT nasal spray Place 2 sprays into both nostrils as needed for allergies or rhinitis.  . Fluticasone-Salmeterol (ADVAIR) 250-50 MCG/DOSE AEPB Inhale 1 puff into the lungs daily as needed (for nasal congestion).   . gabapentin (NEURONTIN) 600 MG tablet Take 1 tablet (600 mg total) by mouth 4 (four)  times daily.  . isosorbide mononitrate (IMDUR) 60 MG 24 hr tablet take 1 tablet by mouth once daily  . metFORMIN (GLUCOPHAGE) 500 MG tablet take 1 tablet by mouth twice a day with food  . metoprolol succinate (TOPROL-XL) 25 MG 24 hr tablet take 1 tablet by mouth daily  . metoprolol succinate (TOPROL-XL) 50 MG 24 hr tablet take 1 tablet by mouth once daily ALONG WITH 25 MG TO EQUAL 75MG  . Multiple Vitamin (MULTIVITAMIN) capsule Take 1 capsule by mouth daily.  . mupirocin ointment (BACTROBAN) 2 % Apply 1 application topically 2 (two) times daily. Apply to the affected area 2 times a day  . Niacin CR 1000 MG TBCR Take 1 tablet (1,000 mg total) by mouth 2 (two) times daily.  [name] omeprazole (PRILOSEC) 10 MG capsule take 1 capsule by mouth once daily  . Oxycodone HCl 10 MG TABS Take 1 tablet (10 mg total) by mouth every 6 (six) hours.  . potassium chloride SA (K-DUR,KLOR-CON) 20 MEQ tablet take 1 tablet by mouth once daily  . rosuvastatin (CRESTOR) 40 MG tablet take 1 tablet by mouth once daily  . torsemide (DEMADEX) 20 MG tablet Take 1 tablet (20 mg total) by mouth daily.  . traMADol-acetaminophen (ULTRACET) 37.5-325 MG tablet Take 1 tablet by mouth 3 (three) times daily.  [name] VITAMIN D, CHOLECALCIFEROL, PO Take 1 tablet by mouth daily.    No facility-administered encounter medications on file as of 02/06/2017.     Activities of Daily Living No flowsheet data found.  Patient Care Team: Tammi Sou, MD as PCP - General (Family Medicine) [name], MD (Orthopedic Surgery) Letta Pate Luanna Salk, MD (Physical Medicine and Rehabilitation) Holli Humbles, MD (Pulmonary Disease) Bond, Tracie Harrier, MD (Ophthalmology) Alda Berthold, DO as Consulting Physician (Neurology)   Assessment:    Physical assessment deferred to PCP.  Exercise Activities and Dietary recommendations   Diet (meal preparation, eat out, water intake, caffeinated beverages, dairy products, fruits and vegetables):    Breakfast: Lunch:  Dinner:      Goals    None     Fall Risk Fall Risk  01/25/2017 12/26/2016 11/28/2016 10/04/2016 09/26/2016  Falls in the past year? No No No No No  Comment - - - - -  Number falls in past yr: - - - - -  Injury with Fall? - - - - -  [place] for fall due to : - - - - -  Follow up - - - - -  Comment - - - - -   Depression Screen PHQ 2/9 Scores 01/25/2017 09/23/2015 07/15/2015 03/16/2015  PHQ - 2 Score 0 0 0 0  PHQ- 9 Score - - - -    Cognitive Function   Montreal Cognitive Assessment  07/14/2014  Visuospatial/ Executive (0/5) 4  Naming (0/3) 3  Attention: Read list of digits (0/2) 2  Attention: Read list of letters (0/1) 1  Attention: Serial 7 subtraction starting at 100 (0/3) 1  Language: Repeat phrase (0/2) 2  Language : Fluency (0/1) 1  Abstraction (0/2) 2  Delayed Recall (0/5) 1  Orientation (0/6) 5  Total 22  Adjusted Score (based on education) 22      Immunization History  Administered Date(s) Administered  . Influenza Whole 05/21/2008, 05/21/2009, 03/25/2010, 05/17/2012  . Influenza, High Dose Seasonal PF 03/22/2015, 03/31/2016  . Influenza,inj,Quad PF,36+ Mos 03/19/2014  . Pneumococcal Conjugate-13 09/10/2013  . Pneumococcal Polysaccharide-23 06/06/2004, 01/03/2008  . Td 08/06/1995, 09/08/2009  . Tdap 09/03/2009  . Zoster 05/17/2012   Screening Tests Health Maintenance  Topic Date Due  . FOOT EXAM  11/24/1950  . OPHTHALMOLOGY EXAM  11/24/1950  . URINE MICROALBUMIN  11/24/1950  . INFLUENZA VACCINE  01/31/2017  . HEMOGLOBIN A1C  02/22/2017  . TETANUS/TDAP  09/09/2019  . PNA vac Low Risk Adult  Completed      Plan:     I have personally reviewed and noted the following in the patient's chart:   . Medical and social history . Use of alcohol, tobacco or illicit drugs  . Current medications and supplements . Functional ability and status . Nutritional status . Physical activity . Advanced directives . List of  other physicians . Hospitalizations, surgeries, and ER visits in previous 12 months . Vitals . Screenings to include cognitive, depression, and falls . Referrals and appointments  In addition, I have reviewed and discussed with patient certain preventive protocols, quality metrics, and best practice recommendations. A written personalized care plan for preventive services as well as general preventive health recommendations were provided to patient.     Gerilyn Nestle, RN  02/05/2017

## 2017-02-06 ENCOUNTER — Encounter: Payer: Self-pay | Admitting: Family Medicine

## 2017-02-06 ENCOUNTER — Ambulatory Visit: Payer: Medicare Other

## 2017-02-06 ENCOUNTER — Ambulatory Visit (INDEPENDENT_AMBULATORY_CARE_PROVIDER_SITE_OTHER): Payer: Medicare Other | Admitting: Family Medicine

## 2017-02-06 VITALS — BP 135/71 | HR 82 | Temp 98.2°F | Resp 16 | Wt 314.8 lb

## 2017-02-06 DIAGNOSIS — I10 Essential (primary) hypertension: Secondary | ICD-10-CM | POA: Diagnosis not present

## 2017-02-06 DIAGNOSIS — Z0000 Encounter for general adult medical examination without abnormal findings: Secondary | ICD-10-CM

## 2017-02-06 DIAGNOSIS — Z23 Encounter for immunization: Secondary | ICD-10-CM

## 2017-02-06 DIAGNOSIS — E7800 Pure hypercholesterolemia, unspecified: Secondary | ICD-10-CM | POA: Diagnosis not present

## 2017-02-06 DIAGNOSIS — R7303 Prediabetes: Secondary | ICD-10-CM

## 2017-02-06 MED ORDER — ZOSTER VAC RECOMB ADJUVANTED 50 MCG/0.5ML IM SUSR: 0.5000 mL | Freq: Once | INTRAMUSCULAR | 1 refills | Status: AC

## 2017-02-06 NOTE — Progress Notes (Signed)
Subjective:   Tim [name] is a 76 y.o. male who presents for Medicare Annual/Subsequent preventive examination.  Review of Systems:  No ROS.  Medicare Wellness Visit. Additional risk factors are reflected in the social history.  Cardiac Risk Factors include: obesity (BMI >30kg/m2);sedentary lifestyle;family history of premature cardiovascular disease;advanced age (>43mn, >>26women);male gender;hypertension;dyslipidemia   Sleep patterns: Sleeps 8 hours, uses CPAP. States that 2-3 nights/week not sleeping d/t pain.   Home Safety/Smoke Alarms: Feels safe in home. Smoke alarms in place.  Living environment; residence and Firearm Safety: Lives with wife in 2 story home.  Seat Belt Safety/Bike Helmet: Wears seat belt.   Counseling:   Eye Exam-Last exam 2016, pt reports glaucoma. Dr. BEdilia [name] Dental-Last exam 12/2016, every 6 months. [place]    Male:   CCS-Colonoscopy 2014, polyps. Recall 5 years.   PSA-  Lab Results  Component Value Date   PSA 0.23 11/23/2015   PSA 0.33 10/07/2014   PSA 0.40 09/25/2013       Objective:    Vitals: BP 135/71 (BP Location: Left Arm, Patient Position: Sitting, Cuff Size: Large)   Pulse 82   Temp 98.2 F (36.8 C) (Oral)   Resp 16   Wt (!) 314 lb 12 oz (142.8 kg)   SpO2 97%   BMI 43.90 kg/m   Body mass index is 43.9 kg/m.  Tobacco History  Smoking Status  . Never Smoker  Smokeless Tobacco  . Never Used     Counseling [name]: Not Answered   Past Medical History:  Diagnosis Date  . ASTHMA   . BPH (benign prostatic hypertrophy)   . CAD (coronary artery disease)    Nonobstructive by 12/2012 cath; then 03/2016 he required BMS to RCA ([facility]).  In-stent restenosis on cath 11/01/16, baloon angioplasty successful--pt to take plavix (no ASA), + eliquis (for PAF).  .Marland KitchenCATARACT, HX OF   . Chronic combined systolic and diastolic CHF (congestive heart failure) ([place] 05/2016   Mild; [facility]  [facility]  . COPD   . Elevated transaminase level 02/2016   Suspect due to fatty liver (documented on u/s 2007). Plan repeat labs 03/2016.  . Marland Kitchenssential hypertension, benign   . Fatty liver 2007   u/s showed fatty liver with hepatosplenomegaly  . GERD (gastroesophageal reflux disease)   . Glaucoma   . GOUT   . Hallux valgus (acquired)   . HH (hiatus hernia)   . HYPERCHOLESTEROLEMIA-PURE   . Hypogonadism male   . IFG (impaired fasting glucose)    HbA1c jumped from 5.7% to 6.1% 03/2015---started metformin at that time.  . Lumbosacral neuritis   . Lumbosacral spondylosis    Lumbar spinal stenosis with neurogenic claudication--contributes to his chronic pain syndrome  . Normal memory function 08/2014   Neuropsychological testing ([facility]): no cognitive impairment or sign of neurodegenerative disorder.  Likely has adjustment d/o with mixed anxiety/depressed features and may benefit from low dose SNRI.    . Marland Kitchenormocytic anemia 03/2016   Mild-pt needs ferritin and vit B12 level checked (as of 03/22/16)  . NSTEMI (non-ST elevated myocardial infarction) ([place]/18/2017   BMS to RCA  . Obesity hypoventilation syndrome ([place]  . Obesity, unspecified   . Orthostatic hypotension   . OSA on CPAP    8 cm H2O  . OSTEOARTHRITIS   . Osteoarthrosis, unspecified whether generalized or localized, unspecified site   . Paroxysmal atrial fibrillation ([place]    (? chronic?) Off  anticoag for a while due to falls.  Then apixaban started 12/2014.  [name] Peripheral neuropathy    [facility]; with chronic neuropathic pain (Dr. Ella [name])  . Personal history of colonic adenoma 10/30/2012   Diminutive adenoma, consider repeat 2019 per GI  . PUD (peptic ulcer disease)   . PULMONARY HYPERTENSION, HX OF   . Secondary male hypogonadism 2017  . 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 Shortness of breath    with exertion: much improved s/p  TAVR and treatment for CHF.  [name] Unspecified glaucoma(365.9)   . Unspecified hereditary and idiopathic peripheral neuropathy approx age 13   bilat LE's, ? left arm, too.  Feet became progressively numb + left foot pain intermittently.  Pt may be trying a spinal stimulator (as of 05/2015)  . VENOUS INSUFFICIENCY    Being followed by Dr. Sharol [name] as of 10/2016 for two R LL venous stasis ulcers/skin tears.  Healed as of 10/30/16 f/u with Dr. Sharol [name].  . VENTRAL HERNIA    Past Surgical History:  Procedure Laterality Date  . AMPUTATION Left 04/11/2013   Procedure: AMPUTATION DIGIT Left 3rd toe;  Surgeon: Tim Minion, MD;  Location: [facility];  Service: Orthopedics;  Laterality: Left;  Left 3rd toe amputation at [facility]  . CARDIAC CATHETERIZATION  1997; 03/10/16   1997 Non-obstructive disease.  03/2016 BMS to RCA, with 25% pDiag dz, o/w normal cors per cath 03/07/16.  Cath 11/01/16: in stent restenosis, successful baloon angioplasty.  [name] CARDIAC CATHETERIZATION  12/24/2012   mild < 20% LCx, prox 30% RCA; LVEF 55-65% , moderate pulmonary HTN, moderate AS  . CARDIOVASCULAR STRESS TEST  05/11/16 ([facility])   Myocardial perfusion imaging:  No ischemia; scar in apex, global hypokinesis, EF 36%.  . Carotid dopplers  03/09/2016   [facility]: no hemodynamically significant stenosis on either side.  . CHOLECYSTECTOMY    . COLONOSCOPY    . COLONOSCOPY [name] 10/30/2012   Procedure: COLONOSCOPY;  Surgeon: Tim Mayer, MD;  Location: [facility];  Service: Endoscopy;  Laterality: [name];  . CORONARY ANGIOPLASTY WITH STENT PLACEMENT  03/2016   [facility]: BMS to RCA-pt was placed on Brilinta.  [name] EYE SURGERY Bilateral cataract  . HEMORRHOID SURGERY    . INTRAOCULAR LENS INSERTION Bilateral   . KNEE SURGERY Right   . LEFT AND RIGHT HEART CATHETERIZATION WITH CORONARY ANGIOGRAM [name] 12/24/2012   Procedure: LEFT AND RIGHT HEART CATHETERIZATION WITH CORONARY ANGIOGRAM;  Surgeon: Tim M Martinique, MD;  Location[facility] C[facility];  Service: Cardiovascular;   Laterality: [name];  . LEG SURGERY Bilateral    lenghtening   . PACEMAKER PLACEMENT  04/13/2016   2nd deg HB after TAVR, pt had DC MDT PPM placed.  [name] SHOULDER ARTHROSCOPY  08/30/2011   Procedure: ARTHROSCOPY SHOULDER;  Surgeon: Tim Minion, MD;  Location: [facility];  Service: Orthopedics;  Laterality: Right;  Right Shoulder Arthroscopy, Debridement, and Decompression  . SPINAL CORD STIMULATOR INSERTION [name] 09/10/2015   Procedure: LUMBAR SPINAL CORD STIMULATOR INSERTION;  Surgeon: [name], MD;  Location: [facility];  Service: Neurosurgery;  Laterality: [name];  . 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  R big toe surg due to osteoarth  . TONSILLECTOMY    . traeculectomy Left    eye  . TRANSCATHETER AORTIC VALVE REPLACEMENT, TRANSFEMORAL  04/11/2016  . TRANSESOPHAGEAL ECHOCARDIOGRAM  03/09/2016   [facility]: EF 55-60%, PFO seen with bi-directional  shunting, no thrombus in appendage.  . TRANSTHORACIC ECHOCARDIOGRAM  01/2015; 01/2016; 05/18/16   01/2015 No signif change in aortic stenosis (moderate).  01/2016 Severe LVH w/small LV cavity, EF 60-65%, grade I diast dysfxn.  05/2016 (s/p TAVR): EF 50-55%, grd I DD, biopros AV good.  [name] VITRECTOMY     Family History  Problem Relation Age of Onset  . Hypertension Mother   . Coronary artery disease Mother   . Heart attack Mother   . Neuropathy Mother   . Pulmonary fibrosis Father        asbestosis   History  Sexual Activity  . Sexual activity: Not Currently    Outpatient Encounter Prescriptions as of 02/06/2017  Medication Sig  . albuterol (PROVENTIL HFA;VENTOLIN HFA) 108 (90 BASE) MCG/ACT inhaler Inhale 2 puffs into the lungs 4 (four) times daily as needed for wheezing or shortness of breath.  . allopurinol (ZYLOPRIM) 300 MG tablet take 1 tablet by mouth once daily with food  . apixaban (ELIQUIS) 5 MG TABS tablet Take 1 tablet (5 mg total) by mouth 2 (two) times daily.  [name] aspirin EC 81 MG tablet Take by mouth.  . B Complex-C (B-COMPLEX WITH  VITAMIN C) tablet Take 1 tablet by mouth daily.  . cadexomer iodine (IODOSORB) 0.9 % gel Apply 1 application topically daily as needed for wound care.  . clotrimazole-betamethasone (LOTRISONE) cream apply to affected area twice a day  . ezetimibe (ZETIA) 10 MG tablet take 1 tablet by mouth once daily  . finasteride (PROSCAR) 5 MG tablet take 1 tablet by mouth once daily  . fluticasone (FLONASE) 50 MCG/ACT nasal spray Place 2 sprays into both nostrils as needed for allergies or rhinitis.  . Fluticasone-Salmeterol (ADVAIR) 250-50 MCG/DOSE AEPB Inhale 1 puff into the lungs daily as needed (for nasal congestion).   . gabapentin (NEURONTIN) 600 MG tablet Take 1 tablet (600 mg total) by mouth 4 (four) times daily.  . isosorbide mononitrate (IMDUR) 60 MG 24 hr tablet take 1 tablet by mouth once daily  . metFORMIN (GLUCOPHAGE) 500 MG tablet take 1 tablet by mouth twice a day with food  . metoprolol succinate (TOPROL-XL) 25 MG 24 hr tablet take 1 tablet by mouth daily  . metoprolol succinate (TOPROL-XL) 50 MG 24 hr tablet take 1 tablet by mouth once daily ALONG WITH 25 MG TO EQUAL 75MG  . Multiple Vitamin (MULTIVITAMIN) capsule Take 1 capsule by mouth daily.  . mupirocin ointment (BACTROBAN) 2 % Apply 1 application topically 2 (two) times daily. Apply to the affected area 2 times a day  . Niacin CR 1000 MG TBCR Take 1 tablet (1,000 mg total) by mouth 2 (two) times daily.  [name] omeprazole (PRILOSEC) 10 MG capsule take 1 capsule by mouth once daily  . Oxycodone HCl 10 MG TABS Take 1 tablet (10 mg total) by mouth every 6 (six) hours.  . potassium chloride SA (K-DUR,KLOR-CON) 20 MEQ tablet take 1 tablet by mouth once daily  . rosuvastatin (CRESTOR) 40 MG tablet take 1 tablet by mouth once daily  . torsemide (DEMADEX) 20 MG tablet Take 1 tablet (20 mg total) by mouth daily.  . traMADol-acetaminophen (ULTRACET) 37.5-325 MG tablet Take 1 tablet by mouth 3 (three) times daily.  [name] VITAMIN D, CHOLECALCIFEROL, PO Take  1 tablet by mouth daily.   [name] Zoster Vac Recomb Adjuvanted [facility]) injection Inject 0.5 mLs into the muscle once.   No facility-administered encounter medications on file as of 02/06/2017.     Activities  of Daily Living In your present state of health, do you have any difficulty performing the following activities: 02/06/2017  Hearing? N  Vision? N  Difficulty concentrating or making decisions? N  Walking or climbing stairs? Y  Comment Uses rail; walks with cane majority of time  Dressing or bathing? N  Doing errands, shopping? Y  Preparing Food and eating ? N  Using the Toilet? N  In the past six months, have you accidently leaked urine? N  Do you have problems with loss of bowel control? N  Managing your Medications? N  Managing your Finances? N  Housekeeping or managing your Housekeeping? N  Some recent data might be hidden    Patient Care Team: Tammi Sou, MD as PCP - General (Family Medicine) Tim Minion, MD (Orthopedic Surgery) Letta Pate Luanna Salk, MD (Physical Medicine and Rehabilitation) Holli Humbles, MD (Pulmonary Disease) Bond, Tracie Harrier, MD (Ophthalmology) Alda Berthold, DO as Consulting Physician (Neurology) Crissie Reese., MD as Consulting Physician (Cardiology) Hart Carwin, MD as Referring Physician (Cardiology)   Assessment:    Physical assessment deferred to PCP.  Exercise Activities and Dietary recommendations Current Exercise Habits: The patient does not participate in regular exercise at present, Exercise limited by: orthopedic condition(s);neurologic condition(s)   Diet (meal preparation, eat out, water intake, caffeinated beverages, dairy products, fruits and vegetables): Drinks water.   Breakfast/Lunch: Oatmeal with raisins Dinner: sandwich, frozen dinners.   Goals      Patient Stated   . Patient states (pt-stated)          Maintain current weight.       Fall Risk Fall Risk  02/06/2017 01/25/2017 12/26/2016 11/28/2016  10/04/2016  Falls in the past year? No No No No No  Comment - - - - -  Number falls in past yr: - - - - -  Injury with Fall? - - - - -  Comment - - - - -  Risk for fall due to : - - - - -  Follow up - - - - -  Comment - - - - -   Depression Screen PHQ 2/9 Scores 02/06/2017 01/25/2017 09/23/2015 07/15/2015  PHQ - 2 Score 0 0 0 0  PHQ- 9 Score - - - -    Cognitive Function MMSE - Mini Mental State Exam 02/06/2017  Orientation to time 4  Orientation to Place 5  Registration 3  Attention/ Calculation 3  Recall 3  Language- name 2 objects 2  Language- repeat 1  Language- follow 3 step command 3  Language- read & follow direction 1  Write a sentence 1  Copy design 1  Total score 27   Montreal Cognitive Assessment  07/14/2014  Visuospatial/ Executive (0/5) 4  Naming (0/3) 3  Attention: Read list of digits (0/2) 2  Attention: Read list of letters (0/1) 1  Attention: Serial 7 subtraction starting at 100 (0/3) 1  Language: Repeat phrase (0/2) 2  Language : Fluency (0/1) 1  Abstraction (0/2) 2  Delayed Recall (0/5) 1  Orientation (0/6) 5  Total 22  Adjusted Score (based on education) 22       Immunization History  Administered Date(s) Administered  . Influenza Whole 05/21/2008, 05/21/2009, 03/25/2010, 05/17/2012  . Influenza, High Dose Seasonal PF 03/22/2015, 03/31/2016  . Influenza,inj,Quad PF,36+ Mos 03/19/2014  . Pneumococcal Conjugate-13 09/10/2013  . Pneumococcal Polysaccharide-23 06/06/2004, 01/03/2008  . Td 08/06/1995, 09/08/2009  . Tdap 09/03/2009  . Zoster 05/17/2012   Shingrix Vaccine Rx  sent to pharmacy  Screening Tests Health Maintenance  Topic Date Due  . FOOT EXAM  11/24/1950  . OPHTHALMOLOGY EXAM  11/24/1950  . URINE MICROALBUMIN  11/24/1950  . INFLUENZA VACCINE  01/31/2017  . HEMOGLOBIN A1C  02/22/2017  . TETANUS/TDAP  09/09/2019  . PNA vac Low Risk Adult  Completed      Plan:    Bring a copy of your advance directives to your next office visit.     Continue doing brain stimulating activities (puzzles, reading, adult coloring books, staying active) to keep memory sharp.   Shingrix vaccine at pharmacy.    I have personally reviewed and noted the following in the patient's chart:   . Medical and social history . Use of alcohol, tobacco or illicit drugs  . Current medications and supplements . Functional ability and status . Nutritional status . Physical activity . Advanced directives . List of other physicians . Hospitalizations, surgeries, and ER visits in previous 12 months . Vitals . Screenings to include cognitive, depression, and falls . Referrals and appointments  In addition, I have reviewed and discussed with patient certain preventive protocols, quality metrics, and best practice recommendations. A written personalized care plan for preventive services as well as general preventive health recommendations were provided to patient.     Gerilyn Nestle, RN  02/06/2017

## 2017-02-06 NOTE — Progress Notes (Signed)
AWV reviewed and agree.  Signed:  Crissie Sickles, MD           02/06/2017

## 2017-02-06 NOTE — Progress Notes (Signed)
OFFICE VISIT  02/06/2017   CC:  Chief Complaint  Patient presents with  . Follow-up    RCI, pt is not fasting.   . Medicare Wellness   HPI:    Patient is a 76 y.o. Caucasian male who presents for f/u prediabetes, HTN, HLD.  No home glucose checks, compliant with metformin.  Trying to make some dietary changes c/w diabetic diet. Reports long hx of complete numbness in feet from toes to half way up calf. Also, burning pain: hx of hereditary PN---chronic pain mgmt.  HTN: no recent home monitoring. At MD offices 130s/70s.  HLD; taking crestor daily, no side effects.  ROS: no polyuria or polydipsia, no palpitations.  He has chronic, stable DOE.   NO myalgias. Past Medical History:  Diagnosis Date  . ASTHMA   . BPH (benign prostatic hypertrophy)   . CAD (coronary artery disease)    Nonobstructive by 12/2012 cath; then 03/2016 he required BMS to RCA ([facility]).  In-stent restenosis on cath 11/01/16, baloon angioplasty successful--pt to take plavix (no ASA), + eliquis (for PAF).  [name] CATARACT, HX OF   . Chronic combined systolic and diastolic CHF (congestive heart failure) ([place]) 05/2016   Mild; [facility]  . COPD   . Elevated transaminase level 02/2016   Suspect due to fatty liver (documented on u/s 2007). Plan repeat labs 03/2016.  .Marland KitchenEssential hypertension, benign   . Fatty liver 2007   u/s showed fatty liver with hepatosplenomegaly  . GERD (gastroesophageal reflux disease)   . Glaucoma   . GOUT   . Hallux valgus (acquired)   . HH (hiatus hernia)   . HYPERCHOLESTEROLEMIA-PURE   . Hypogonadism male   . IFG (impaired fasting glucose)    HbA1c jumped from 5.7% to 6.1% 03/2015---started metformin at that time.  . Lumbosacral neuritis   . Lumbosacral spondylosis    Lumbar spinal stenosis with neurogenic claudication--contributes to his chronic pain syndrome  . Normal memory function 08/2014   Neuropsychological testing ([facility]): no cognitive impairment or sign of neurodegenerative disorder.  Likely has adjustment d/o with mixed anxiety/depressed features and may benefit from low dose SNRI.    .Marland KitchenNormocytic anemia 03/2016   Mild-pt needs ferritin and vit B12 level checked (as of 03/22/16)  . NSTEMI (non-ST elevated myocardial infarction) ([place] 03/20/2016   BMS to RCA  . 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 Obesity, unspecified   . Orthostatic hypotension   . OSA on CPAP    8 cm H2O  . OSTEOARTHRITIS   . Osteoarthrosis, unspecified whether generalized or localized, unspecified site   . Paroxysmal atrial fibrillation ([place]    (? chronic?) Off anticoag for a while due to falls.  Then apixaban started 12/2014.  .Marland KitchenPeripheral neuropathy    Heredetary; with chronic neuropathic pain (Dr. KElla Bodo  . Personal history of colonic adenoma 10/30/2012   Diminutive adenoma, consider repeat 2019 per GI  . PUD (peptic ulcer disease)   . PULMONARY HYPERTENSION, HX OF   . Secondary male hypogonadism 2017  . 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 Shortness of breath    with exertion: much improved s/p TAVR and treatment for CHF.  .Marland KitchenUnspecified glaucoma(365.9)   . Unspecified hereditary and idiopathic peripheral neuropathy approx age 76  bilat LE's, ? left arm, too.  Feet became progressively numb + left foot pain  intermittently.  Pt may be trying a spinal stimulator (as of 05/2015)  . VENOUS INSUFFICIENCY    Being followed by Dr. Sharol Given as of 10/2016 for two R LL venous stasis ulcers/skin tears.  Healed as of 10/30/16 f/u with Dr. Sharol Given.  . VENTRAL HERNIA     Past Surgical History:  Procedure Laterality Date  . AMPUTATION Left 04/11/2013   Procedure: AMPUTATION DIGIT Left 3rd toe;  Surgeon: [name], MD;  Location: [place];  Service: Orthopedics;  Laterality: Left;  Left 3rd toe amputation at [facility]  . CARDIAC CATHETERIZATION  1997;  03/10/16   1997 Non-obstructive disease.  03/2016 BMS to RCA, with 25% pDiag dz, o/w normal cors per cath 03/07/16.  Cath 11/01/16: in stent restenosis, successful baloon angioplasty.  [name] CARDIAC CATHETERIZATION  12/24/2012   mild < 20% LCx, prox 30% RCA; LVEF 55-65% , moderate pulmonary HTN, moderate AS  . CARDIOVASCULAR STRESS TEST  05/11/16 ([facility])   Myocardial perfusion imaging:  No ischemia; scar in apex, global hypokinesis, EF 36%.  . Carotid dopplers  03/09/2016   [facility]: no hemodynamically significant stenosis on either side.  . CHOLECYSTECTOMY    . COLONOSCOPY    . COLONOSCOPY [name] 10/30/2012   Procedure: COLONOSCOPY;  Surgeon: [name], MD;  Location: [facility];  Service: Endoscopy;  Laterality: [name];  . CORONARY ANGIOPLASTY WITH STENT PLACEMENT  03/2016   [facility]: BMS to RCA-pt was placed on Brilinta.  [name] EYE SURGERY Bilateral cataract  . HEMORRHOID SURGERY    . INTRAOCULAR LENS INSERTION Bilateral   . KNEE SURGERY Right   . LEFT AND RIGHT HEART CATHETERIZATION WITH CORONARY ANGIOGRAM [name] 12/24/2012   Procedure: LEFT AND RIGHT HEART CATHETERIZATION WITH CORONARY ANGIOGRAM;  Surgeon: [name], MD;  Location: [facility] CATH [facility];  Service: Cardiovascular;  Laterality: [name];  . LEG SURGERY Bilateral    lenghtening   . PACEMAKER PLACEMENT  04/13/2016   2nd deg HB after TAVR, pt had DC MDT PPM placed.  [name] SHOULDER ARTHROSCOPY  08/30/2011   Procedure: ARTHROSCOPY SHOULDER;  Surgeon: [name], MD;  Location: [place];  Service: Orthopedics;  Laterality: Right;  Right Shoulder Arthroscopy, Debridement, and Decompression  . SPINAL CORD STIMULATOR INSERTION [name] 09/10/2015   Procedure: LUMBAR SPINAL CORD STIMULATOR INSERTION;  Surgeon: [name], MD;  Location: [place] [facility];  Service: Neurosurgery;  Laterality: [name];  . 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  R big toe surg due to osteoarth  . TONSILLECTOMY    . traeculectomy Left    eye  . TRANSCATHETER AORTIC VALVE REPLACEMENT,  TRANSFEMORAL  04/11/2016  . TRANSESOPHAGEAL ECHOCARDIOGRAM  03/09/2016   [facility]: EF 55-60%, PFO seen with bi-directional shunting, no thrombus in appendage.  . TRANSTHORACIC ECHOCARDIOGRAM  01/2015; 01/2016; 05/18/16   01/2015 No signif change in aortic stenosis (moderate).  01/2016 Severe LVH w/small LV cavity, EF 60-65%, grade I diast dysfxn.  05/2016 (s/p TAVR): EF 50-55%, grd I DD, biopros AV good.  [name] VITRECTOMY      Outpatient Medications Prior to Visit  Medication Sig Dispense Refill  . albuterol (PROVENTIL HFA;VENTOLIN HFA) 108 (90 BASE) MCG/ACT inhaler Inhale 2 puffs into the lungs 4 (four) times daily as needed for wheezing or shortness of breath.    . allopurinol (ZYLOPRIM) 300 MG tablet take 1 tablet by mouth once daily with food 30 tablet 11  . apixaban (ELIQUIS) 5 MG TABS tablet Take 1 tablet (5 mg total) by mouth 2 (two) times  daily. 60 tablet 0  . aspirin EC 81 MG tablet Take by mouth.    . B Complex-C (B-COMPLEX WITH VITAMIN C) tablet Take 1 tablet by mouth daily.    . cadexomer iodine (IODOSORB) 0.9 % gel Apply 1 application topically daily as needed for wound care. 10 g 3  . clotrimazole-betamethasone (LOTRISONE) cream apply to affected area twice a day 45 g 5  . ezetimibe (ZETIA) 10 MG tablet take 1 tablet by mouth once daily 90 tablet 3  . finasteride (PROSCAR) 5 MG tablet take 1 tablet by mouth once daily 90 tablet 0  . fluticasone (FLONASE) 50 MCG/ACT nasal spray Place 2 sprays into both nostrils as needed for allergies or rhinitis.    . Fluticasone-Salmeterol (ADVAIR) 250-50 MCG/DOSE AEPB Inhale 1 puff into the lungs daily as needed (for nasal congestion).     . gabapentin (NEURONTIN) 600 MG tablet Take 1 tablet (600 mg total) by mouth 4 (four) times daily. 120 tablet 3  . isosorbide mononitrate (IMDUR) 60 MG 24 hr tablet take 1 tablet by mouth once daily 30 tablet 6  . metFORMIN (GLUCOPHAGE) 500 MG tablet take 1 tablet by mouth twice a day with food 60 tablet 6  .  metoprolol succinate (TOPROL-XL) 25 MG 24 hr tablet take 1 tablet by mouth daily 90 tablet 1  . metoprolol succinate (TOPROL-XL) 50 MG 24 hr tablet take 1 tablet by mouth once daily ALONG WITH 25 MG TO EQUAL 75MG 30 tablet 6  . Multiple Vitamin (MULTIVITAMIN) capsule Take 1 capsule by mouth daily.    . mupirocin ointment (BACTROBAN) 2 % Apply 1 application topically 2 (two) times daily. Apply to the affected area 2 times a day 22 g 3  . Niacin CR 1000 MG TBCR Take 1 tablet (1,000 mg total) by mouth 2 (two) times daily. 180 each 3  . omeprazole (PRILOSEC) 10 MG capsule take 1 capsule by mouth once daily 30 capsule 11  . Oxycodone HCl 10 MG TABS Take 1 tablet (10 mg total) by mouth every 6 (six) hours. 120 tablet 0  . potassium chloride SA (K-DUR,KLOR-CON) 20 MEQ tablet take 1 tablet by mouth once daily 90 tablet 1  . rosuvastatin (CRESTOR) 40 MG tablet take 1 tablet by mouth once daily 15 tablet 0  . torsemide (DEMADEX) 20 MG tablet Take 1 tablet (20 mg total) by mouth daily. 30 tablet 3  . traMADol-acetaminophen (ULTRACET) 37.5-325 MG tablet Take 1 tablet by mouth 3 (three) times daily. 90 tablet 3  . VITAMIN D, CHOLECALCIFEROL, PO Take 1 tablet by mouth daily.      No facility-administered medications prior to visit.     Allergies  Allergen Reactions  . Brimonidine Tartrate Shortness Of Breath    Alphagan-Shortness of breath  . Brimonidine Tartrate Palpitations  . Brinzolamide Shortness Of Breath    AZOPT- Shortness of breath  . Latanoprost Shortness Of Breath    XALATAN- Shortness of breath  . Nucynta [Tapentadol] Shortness Of Breath  . Sulfa Antibiotics Palpitations  . Timolol Maleate Shortness Of Breath and Other (See Comments)    TIMOPTIC- Aggravated asthma  . Cardizem  [Diltiazem Hcl] Swelling  . Diltiazem Swelling     leg swelling  . Rofecoxib Swelling     VIOXX- leg swelling  . Vancomycin Hives and Other (See Comments)    Possible "Red Man Syndrome"? > hives/blisters  .  Codeine Other (See Comments)    Childhood reaction  . Celecoxib Other (See Comments)  CELLBREX-confusion  . Colchicine Diarrhea    diarrhea  . Tape Rash    ROS As per HPI  PE: Blood pressure 135/71, pulse 82, temperature 98.2 F (36.8 C), temperature source Oral, resp. rate 16, weight (!) 314 lb 12 oz (142.8 kg), SpO2 97 %. Gen: Alert, well appearing.  Patient is oriented to person, place, time, and situation. AFFECT: pleasant, lucid thought and speech. CV: irreg irreg, distant S1 and S2, ? Systolic murmur-soft.  No rub. LUNGS: CTA bilat, nonlabored resps. EXT; bilat 1+ pitting edema from ankles to midway up LL's.  No erythema or tenderness. Foot exam -  no swelling, tenderness or vascular lesions. Some scattered thin callouses.  Color and temperature is normal. He has NO sensation to light touch and monofilament testing on either foot.  Peripheral pulses are not palpable due to excessive fatty tissue and nonpitting edema in ankles and dorsum of foot. Toenails are mildly thickened.  He is s/p ambutation of two toes on L foot.   LABS:  Lab Results  Component Value Date   TSH 1.67 02/09/2016   Lab Results  Component Value Date   WBC 8.8 08/25/2016   HGB 13.4 08/25/2016   HCT 38.9 (L) 08/25/2016   MCV 89.1 08/25/2016   PLT 149.0 (L) 08/25/2016   Lab Results  Component Value Date   CREATININE 0.93 09/01/2016   BUN 24 (H) 09/01/2016   NA 139 09/01/2016   K 4.8 09/01/2016   CL 102 09/01/2016   CO2 28 09/01/2016   Lab Results  Component Value Date   ALT 15 03/20/2016   AST 14 03/20/2016   ALKPHOS 83 03/20/2016   BILITOT 0.6 03/20/2016   Lab Results  Component Value Date   CHOL 120 11/23/2015   Lab Results  Component Value Date   HDL 49.10 11/23/2015   Lab Results  Component Value Date   LDLCALC 51 11/23/2015   Lab Results  Component Value Date   TRIG 100.0 11/23/2015   Lab Results  Component Value Date   CHOLHDL 2 11/23/2015   Lab Results  Component  Value Date   PSA 0.23 11/23/2015   PSA 0.33 10/07/2014   PSA 0.40 09/25/2013   Lab Results  Component Value Date   HGBA1C 6.1 08/25/2016    IMPRESSION AND PLAN:  1) Prediabetes: doing well on metformin 500 mg bid. Check HbA1c when he returns for fasting labs. Feet exam today: no sensation due to his hereditary PN.   Will get urine microalb/cr when he returns for fasting labs--he is unable to provide a urine specimen today.  2) HTN: The current medical regimen is effective;  continue present plan and medications. Lytes/cr when returns for fasting labs.  3) HLD: tolerating statin.  Return for FLP at earliest convenience.  An After Visit Summary was printed and given to the patient.  FOLLOW UP: Return in about 6 months (around 08/09/2017) for annual CPE (fasting).  Also, needs fasting lab visit at his earliest convenience..  Signed:  Crissie Sickles, MD           02/06/2017

## 2017-02-06 NOTE — Patient Instructions (Signed)
Bring a copy of your advance directives to your next office visit.   Continue doing brain stimulating activities (puzzles, reading, adult coloring books, staying active) to keep memory sharp.   Shingrix vaccine at pharmacy.    Health Maintenance, Male A healthy lifestyle and preventive care is important for your health and wellness. Ask your health care provider about what schedule of regular examinations is right for you. What should I know about weight and diet? Eat a Healthy Diet  Eat plenty of vegetables, fruits, whole grains, low-fat dairy products, and lean protein.  Do not eat a lot of foods high in solid fats, added sugars, or salt.  Maintain a Healthy Weight Regular exercise can help you achieve or maintain a healthy weight. You should:  Do at least 150 minutes of exercise each week. The exercise should increase your heart rate and make you sweat (moderate-intensity exercise).  Do strength-training exercises at least twice a week.  Watch Your Levels of Cholesterol and Blood Lipids  Have your blood tested for lipids and cholesterol every 5 years starting at 77 years of age. If you are at high risk for heart disease, you should start having your blood tested when you are 77 years old. You may need to have your cholesterol levels checked more often if: ? Your lipid or cholesterol levels are high. ? You are older than 77 years of age. ? You are at high risk for heart disease.  What should I know about cancer screening? Many types of cancers can be detected early and may often be prevented. Lung Cancer  You should be screened every year for lung cancer if: ? You are a current smoker who has smoked for at least 30 years. ? You are a former smoker who has quit within the past 15 years.  Talk to your health care provider about your screening options, when you should start screening, and how often you should be screened.  Colorectal Cancer  Routine colorectal cancer  screening usually begins at 77 years of age and should be repeated every 5-10 years until you are 76 years old. You may need to be screened more often if early forms of precancerous polyps or small growths are found. Your health care provider may recommend screening at an earlier age if you have risk factors for colon cancer.  Your health care provider may recommend using home test kits to check for hidden blood in the stool.  A small camera at the end of a tube can be used to examine your colon (sigmoidoscopy or colonoscopy). This checks for the earliest forms of colorectal cancer.  Prostate and Testicular Cancer  Depending on your age and overall health, your health care provider may do certain tests to screen for prostate and testicular cancer.  Talk to your health care provider about any symptoms or concerns you have about testicular or prostate cancer.  Skin Cancer  Check your skin from head to toe regularly.  Tell your health care provider about any new moles or changes in moles, especially if: ? There is a change in a mole's size, shape, or color. ? You have a mole that is larger than a pencil eraser.  Always use sunscreen. Apply sunscreen liberally and repeat throughout the day.  Protect yourself by wearing long sleeves, pants, a wide-brimmed hat, and sunglasses when outside.  What should I know about heart disease, diabetes, and high blood pressure?  If you are 78-76 years of age, have your blood pressure  checked every 3-5 years. If you are 63 years of age or older, have your blood pressure checked every year. You should have your blood pressure measured twice-once when you are at a hospital or clinic, and once when you are not at a hospital or clinic. Record the average of the two measurements. To check your blood pressure when you are not at a hospital or clinic, you can use: ? An automated blood pressure machine at a pharmacy. ? A home blood pressure monitor.  Talk to your  health care provider about your target blood pressure.  If you are between 5-32 years old, ask your health care provider if you should take aspirin to prevent heart disease.  Have regular diabetes screenings by checking your fasting blood sugar level. ? If you are at a normal weight and have a low risk for diabetes, have this test once every three years after the age of 63. ? If you are overweight and have a high risk for diabetes, consider being tested at a younger age or more often.  A one-time screening for abdominal aortic aneurysm (AAA) by ultrasound is recommended for men aged 22-75 years who are current or former smokers. What should I know about preventing infection? Hepatitis B If you have a higher risk for hepatitis B, you should be screened for this virus. Talk with your health care provider to find out if you are at risk for hepatitis B infection. Hepatitis C Blood testing is recommended for:  Everyone born from [birth year] through 1966.  Anyone with known risk factors for hepatitis C.  Sexually Transmitted Diseases (STDs)  You should be screened each year for STDs including gonorrhea and chlamydia if: ? You are sexually active and are younger than 77 years of age. ? You are older than 77 years of age and your health care provider tells you that you are at risk for this type of infection. ? Your sexual activity has changed since you were last screened and you are at an increased risk for chlamydia or gonorrhea. Ask your health care provider if you are at risk.  Talk with your health care provider about whether you are at high risk of being infected with HIV. Your health care provider may recommend a prescription medicine to help prevent HIV infection.  What else can I do?  Schedule regular health, dental, and eye exams.  Stay current with your vaccines (immunizations).  Do not use any tobacco products, such as cigarettes, chewing tobacco, and e-cigarettes. If you need help  quitting, ask your health care provider.  Limit alcohol intake to no more than 2 drinks per day. One drink equals 12 ounces of beer, 5 ounces of wine, or 1 ounces of hard liquor.  Do not use street drugs.  Do not share needles.  Ask your health care provider for help if you need support or information about quitting drugs.  Tell your health care provider if you often feel depressed.  Tell your health care provider if you have ever been abused or do not feel safe at home. This information is not intended to replace advice given to you by your health care provider. Make sure you discuss any questions you have with your health care provider. Document Released: 12/16/2007 Document Revised: 02/16/2016 Document Reviewed: 03/23/2015 Elsevier Interactive Patient Education  [employer].

## 2017-02-07 ENCOUNTER — Other Ambulatory Visit: Payer: Self-pay | Admitting: Cardiology

## 2017-02-07 DIAGNOSIS — I4891 Unspecified atrial fibrillation: Secondary | ICD-10-CM

## 2017-02-08 ENCOUNTER — Encounter: Payer: Self-pay | Admitting: Pharmacist

## 2017-02-08 NOTE — Telephone Encounter (Signed)
This encounter was created in error - please disregard.

## 2017-02-09 ENCOUNTER — Other Ambulatory Visit (INDEPENDENT_AMBULATORY_CARE_PROVIDER_SITE_OTHER): Payer: Medicare Other

## 2017-02-09 DIAGNOSIS — I10 Essential (primary) hypertension: Secondary | ICD-10-CM

## 2017-02-09 DIAGNOSIS — R7303 Prediabetes: Secondary | ICD-10-CM

## 2017-02-09 DIAGNOSIS — E7800 Pure hypercholesterolemia, unspecified: Secondary | ICD-10-CM

## 2017-02-09 LAB — BASIC METABOLIC PANEL
BUN: 25 mg/dL — ABNORMAL HIGH (ref 6–23)
CHLORIDE: 103 meq/L (ref 96–112)
CO2: 34 meq/L — AB (ref 19–32)
Calcium: 9.4 mg/dL (ref 8.4–10.5)
Creatinine, Ser: 0.92 mg/dL (ref 0.40–1.50)
GFR: 84.74 mL/min (ref >60.00)
Glucose, Bld: 106 mg/dL — ABNORMAL HIGH (ref 70–99)
Potassium: 4.4 mEq/L (ref 3.5–5.1)
SODIUM: 143 meq/L (ref 135–145)

## 2017-02-09 LAB — LIPID PANEL
Cholesterol: 122 mg/dL (ref 0–200)
HDL: 55.3 mg/dL (ref >39.00)
LDL Cholesterol: 38 mg/dL (ref 0–99)
NONHDL: 66.51
Total CHOL/HDL Ratio: 2
Triglycerides: 142 mg/dL (ref 0.0–149.0)
VLDL: 28.4 mg/dL (ref 0.0–40.0)

## 2017-02-09 LAB — HEMOGLOBIN A1C: Hgb A1c MFr Bld: 6 % (ref 4.6–6.5)

## 2017-02-09 LAB — MICROALBUMIN / CREATININE URINE RATIO
CREATININE, U: 80.5 mg/dL
MICROALB/CREAT RATIO: 0.9 mg/g (ref 0.0–30.0)
Microalb, Ur: <0.7 mg/dL (ref 0.0–1.9)

## 2017-02-18 ENCOUNTER — Other Ambulatory Visit: Payer: Self-pay | Admitting: Family Medicine

## 2017-02-19 NOTE — Telephone Encounter (Signed)
[place].

## 2017-02-22 ENCOUNTER — Encounter: Payer: Medicare Other | Attending: Registered Nurse | Admitting: Registered Nurse

## 2017-02-22 ENCOUNTER — Encounter: Payer: Self-pay | Admitting: Registered Nurse

## 2017-02-22 ENCOUNTER — Other Ambulatory Visit: Payer: Self-pay | Admitting: Family Medicine

## 2017-02-22 VITALS — BP 121/64 | HR 73

## 2017-02-22 DIAGNOSIS — M109 Gout, unspecified: Secondary | ICD-10-CM | POA: Insufficient documentation

## 2017-02-22 DIAGNOSIS — Z79899 Other long term (current) drug therapy: Secondary | ICD-10-CM

## 2017-02-22 DIAGNOSIS — E7800 Pure hypercholesterolemia, unspecified: Secondary | ICD-10-CM | POA: Diagnosis not present

## 2017-02-22 DIAGNOSIS — K219 Gastro-esophageal reflux disease without esophagitis: Secondary | ICD-10-CM | POA: Insufficient documentation

## 2017-02-22 DIAGNOSIS — G894 Chronic pain syndrome: Secondary | ICD-10-CM | POA: Diagnosis not present

## 2017-02-22 DIAGNOSIS — E1140 Type 2 diabetes mellitus with diabetic neuropathy, unspecified: Secondary | ICD-10-CM

## 2017-02-22 DIAGNOSIS — I2510 Atherosclerotic heart disease of native coronary artery without angina pectoris: Secondary | ICD-10-CM | POA: Insufficient documentation

## 2017-02-22 DIAGNOSIS — M48062 Spinal stenosis, lumbar region with neurogenic claudication: Secondary | ICD-10-CM

## 2017-02-22 DIAGNOSIS — M1990 Unspecified osteoarthritis, unspecified site: Secondary | ICD-10-CM | POA: Diagnosis not present

## 2017-02-22 DIAGNOSIS — Z5181 Encounter for therapeutic drug level monitoring: Secondary | ICD-10-CM

## 2017-02-22 DIAGNOSIS — G609 Hereditary and idiopathic neuropathy, unspecified: Secondary | ICD-10-CM | POA: Insufficient documentation

## 2017-02-22 DIAGNOSIS — J449 Chronic obstructive pulmonary disease, unspecified: Secondary | ICD-10-CM | POA: Diagnosis not present

## 2017-02-22 DIAGNOSIS — M79672 Pain in left foot: Secondary | ICD-10-CM | POA: Diagnosis present

## 2017-02-22 MED ORDER — OXYCODONE HCL 10 MG PO TABS: 10.0000 mg | ORAL_TABLET | Freq: Four times a day (QID) | ORAL | 0 refills | Status: DC

## 2017-02-22 NOTE — Progress Notes (Signed)
Subjective:    Patient ID: Tim [name], male    DOB: January 10, 1941, 76 y.o.   MRN: [id number]  HPI:  [name] is a 76year old male who returns for follow upappointmentfor chronic pain and medication refill. He states his pain is located in his lower back and left foot.He rates his pain 8. His current exercise regime is walking and using the stationary bike for 30 minutes every other day.   His last UDS was on 12/26/2016 it was consistent.   Pain Inventory Average Pain 4 Pain Right Now 8 My pain is sharp, dull, stabbing, tingling and aching  In the last 24 hours, has pain interfered with the following? General activity 8 Relation with others 3 Enjoyment of life 6 What TIME of day is your pain at its worst? night Sleep (in general) Poor  Pain is worse with: walking, bending, inactivity, standing and some activites Pain improves with: rest, pacing activities, medication, TENS and injections Relief from Meds: 6  Mobility walk without assistance use a cane  Function retired  Neuro/Psych weakness numbness tremor tingling trouble walking spasms  Prior Studies Any changes since last visit?  no  Physicians involved in your care Any changes since last visit?  no   Family History  Problem Relation Age of Onset  . Hypertension Mother   . Coronary artery disease Mother   . Heart attack Mother   . Neuropathy Mother   . Pulmonary fibrosis Father        asbestosis   Social History   Social History  . Marital status: Married    Spouse name: [name]  . Number of children: 0  . Years of education: 20   Occupational History  . engineering     retired   Social History Main Topics  . Smoking status: Never Smoker  . Smokeless tobacco: Never Used  . Alcohol use No  . Drug use: No  . Sexual activity: Not Currently   Other Topics Concern  . Not on file   Social History Narrative   HSG, [name] - BS, [name] - MS-engineering, 2 years on PhD -  [place] [place]. Married - '65 - 61yr/divorced; '76- 3 yrs/divorced; '92 . No children. 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 ACP/Living Will - Yes CPR; long-term Mechanical ventilation as long as he was able to cognate; ok for long term artificial nutrition. Precondition being able to cognate and not to have too much pain.    Past Surgical History:  Procedure Laterality Date  . AMPUTATION Left 04/11/2013   Procedure: AMPUTATION DIGIT Left 3rd toe;  Surgeon: [name] MD;  [facility] [facility]  Service: Orthopedics;  Laterality: Left;  Left 3rd toe amputation at [facility]  . CARDIAC CATHETERIZATION  1997; 03/10/16   1997 Non-obstructive disease.  03/2016 BMS to RCA, with 25% pDiag dz, o/w normal cors per cath 03/07/16.  Cath 11/01/16: in stent restenosis, successful baloon angioplasty.  .Marland KitchenCARDIAC CATHETERIZATION  12/24/2012   mild < 20% LCx, prox 30% RCA; LVEF 55-65% , moderate pulmonary HTN, moderate AS  . CARDIOVASCULAR STRESS TEST  05/11/16 ([facility])   Myocardial perfusion imaging:  No ischemia; scar in apex, global hypokinesis, EF 36%.  . Carotid dopplers  03/09/2016   [facility]: no hemodynamically significant stenosis on either side.  . CHOLECYSTECTOMY    . COLONOSCOPY    . COLONOSCOPY [name] 10/30/2012   Procedure: COLONOSCOPY;  Surgeon: CGatha Mayer MD;  [facility];  Service: Endoscopy;  Laterality: [name];  . CORONARY ANGIOPLASTY WITH STENT PLACEMENT  03/2016   [facility]: BMS to RCA-pt was placed on Brilinta.  [name] EYE SURGERY Bilateral cataract  . HEMORRHOID SURGERY    . INTRAOCULAR LENS INSERTION Bilateral   . KNEE SURGERY Right   . LEFT AND RIGHT HEART CATHETERIZATION WITH CORONARY ANGIOGRAM [name] 12/24/2012   Procedure: LEFT AND RIGHT HEART CATHETERIZATION WITH CORONARY ANGIOGRAM;  Surgeon: [name], MD;  Location: [facility] CATH LAB;  Service: Cardiovascular;  Laterality: [name];  . LEG SURGERY Bilateral    lenghtening   . PACEMAKER PLACEMENT  04/13/2016   2nd deg HB after  TAVR, pt had DC MDT PPM placed.  [name] SHOULDER ARTHROSCOPY  08/30/2011   Procedure: ARTHROSCOPY SHOULDER;  Surgeon: [name], MD;  Location: [place];  Service: Orthopedics;  Laterality: Right;  Right Shoulder Arthroscopy, Debridement, and Decompression  . SPINAL CORD STIMULATOR INSERTION [name] 09/10/2015   Procedure: LUMBAR SPINAL CORD STIMULATOR INSERTION;  Surgeon: [name], MD;  Location: [place] [facility]URO ORS;  Service: Neurosurgery;  Laterality: [name];  . 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  R big toe surg due to osteoarth  . TONSILLECTOMY    . traeculectomy Left    eye  . TRANSCATHETER AORTIC VALVE REPLACEMENT, TRANSFEMORAL  04/11/2016  . TRANSESOPHAGEAL ECHOCARDIOGRAM  03/09/2016   [facility]: EF 55-60%, PFO seen with bi-directional shunting, no thrombus in appendage.  . TRANSTHORACIC ECHOCARDIOGRAM  01/2015; 01/2016; 05/18/16   01/2015 No signif change in aortic stenosis (moderate).  01/2016 Severe LVH w/small LV cavity, EF 60-65%, grade I diast dysfxn.  05/2016 (s/p TAVR): EF 50-55%, grd I DD, biopros AV good.  [name] VITRECTOMY     Past Medical History:  Diagnosis Date  . ASTHMA   . BPH (benign prostatic hypertrophy)   . CAD (coronary artery disease)    Nonobstructive by 12/2012 cath; then 03/2016 he required BMS to RCA ([facility]).  In-stent restenosis on cath 11/01/16, baloon angioplasty successful--pt to take plavix (no ASA), + eliquis (for PAF).  [name] CATARACT, HX OF   . Chronic combined systolic and diastolic CHF (congestive heart failure) ([place]) 05/2016   Mild; [facility]ade[facility] 64m [name]  . COPD   . Elevated transaminase level 02/2016   Suspect due to fatty liver (documented on u/s 2007). Plan repeat labs 03/2016.  .Marland KitchenEssential hypertension, benign   . Fatty liver 2007   u/s showed fatty liver with hepatosplenomegaly  . GERD (gastroesophageal reflux disease)   . Glaucoma   . GOUT   . Hallux valgus (acquired)   . HH (hiatus hernia)   . HYPERCHOLESTEROLEMIA-PURE   .  Hypogonadism male   . IFG (impaired fasting glucose)    HbA1c jumped from 5.7% to 6.1% 03/2015---started metformin at that time.  . Lumbosacral neuritis   . Lumbosacral spondylosis    Lumbar spinal stenosis with neurogenic claudication--contributes to his chronic pain syndrome  . Normal memory function 08/2014   Neuropsychological testing ([facility]): no cognitive impairment or sign of neurodegenerative disorder.  Likely has adjustment d/o with mixed anxiety/depressed features and may benefit from low dose SNRI.    .Marland KitchenNormocytic anemia 03/2016   Mild-pt needs ferritin and vit B12 level checked (as of 03/22/16)  . NSTEMI (non-ST elevated myocardial infarction) ([facility] 03/20/2016   BMS to RCA  . Obesity hypoventilation syndrome ([facility]   . Obesity, unspecified   . Orthostatic hypotension   . OSA on CPAP  8 cm H2O  . OSTEOARTHRITIS   . Osteoarthrosis, unspecified whether generalized or localized, unspecified site   . Paroxysmal atrial fibrillation ([place]) 2003    (? chronic?) Off anticoag for a while due to falls.  Then apixaban started 12/2014.  [name] Peripheral neuropathy    Heredetary; with chronic neuropathic pain (Dr. Ella Bodo)  . Personal history of colonic adenoma 10/30/2012   Diminutive adenoma, consider repeat 2019 per GI  . PUD (peptic ulcer disease)   . PULMONARY HYPERTENSION, HX OF   . Secondary male hypogonadism 2017  . 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 Shortness of breath    with exertion: much improved s/p TAVR and treatment for CHF.  [name] Unspecified glaucoma(365.9)   . Unspecified hereditary and idiopathic peripheral neuropathy approx age 58   bilat LE's, ? left arm, too.  Feet became progressively numb + left foot pain intermittently.  Pt may be trying a spinal stimulator (as of 05/2015)  . VENOUS INSUFFICIENCY    Being followed by Dr. Sharol Given as of 10/2016 for two R LL venous stasis ulcers/skin  tears.  Healed as of 10/30/16 f/u with Dr. Sharol Given.  . VENTRAL HERNIA    There were no vitals taken for this visit.  Opioid Risk Score:   Fall Risk Score:  `1  Depression screen PHQ 2/9  Depression screen [facility] 2/9 02/06/2017 01/25/2017 09/23/2015 07/15/2015 03/16/2015 02/16/2015 09/16/2014  Decreased Interest 0 0 0 0 0 0 1  Down, Depressed, Hopeless 0 0 0 0 0 0 0  PHQ - 2 Score 0 0 0 0 0 0 1  Altered sleeping - - - - - - 2  Tired, decreased energy - - - - - - 2  Change in appetite - - - - - - 1  Feeling bad or failure about yourself  - - - - - - 0  Trouble concentrating - - - - - - 0  Moving slowly or fidgety/restless - - - - - - 0  Suicidal thoughts - - - - - - 0  PHQ-9 Score - - - - - - 6  Some recent data might be hidden     Review of Systems  Constitutional: Negative.   HENT: Negative.   Eyes: Negative.   Respiratory: Positive for apnea and shortness of breath.   Cardiovascular: Negative.   Gastrointestinal: Positive for constipation.  Endocrine: Negative.   Genitourinary: Negative.   Musculoskeletal: Positive for joint swelling.  Skin: Negative.   Allergic/Immunologic: Negative.   Neurological: Negative.   Hematological: Negative.   Psychiatric/Behavioral: Negative.   All other systems reviewed and are negative.      Objective:   Physical Exam  Constitutional: He is oriented to person, place, and time. He appears well-developed and well-nourished.  HENT:  Head: Normocephalic and atraumatic.  Neck: Normal range of motion. Neck supple.  Cardiovascular: Normal rate and regular rhythm.   Pulmonary/Chest: Effort normal and breath sounds normal.  Musculoskeletal:  Normal Muscle Bulk and Muscle Testing Reveals:  Upper Extremities: Right: Decreased ROM 45 Degrees and Muscle Strength 4/5 Left: Full ROM and Muscle Strength 5/5 Back without spinal tenderness noted Lower Extremities: Full ROM and Muscle Strength 5/5 Arises from chair slowly using straight cane for  support Narrow Based gait    Neurological: He is alert and oriented to person, place, and time.  Skin: Skin is warm and dry.  Psychiatric: He has  a normal mood and affect.  Nursing note and vitals reviewed.         Assessment & Plan:  1. 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 Continue Gabapentin and Tramadol. 02/22/2017 Continue:Oxycodone 10 mg one tablet QID#120.  We will continue the opioid monitoring program, this consists of regular clinic visits, examinations, urine drug screen, pill counts as well as use of [place] Controlled Substance Reporting System. 2. DDD lumbar spine with LBP: Neurology Following. Continue with activity and heat therapy. 02/22/2017 S/P Permanent Spinal Stimulator Placed 09/10/15 via Dr. Maryjean Ka.  20 minutes of face to face patient care time was spent during this visit. All questions were encouraged and answered.  F/U in 1 month

## 2017-02-23 ENCOUNTER — Telehealth: Payer: Self-pay | Admitting: Registered Nurse

## 2017-02-23 NOTE — Telephone Encounter (Signed)
On 02/23/2017 the  [place] was reviewed no conflict was seen on the [place] with multiple prescribers. Tim [name] has a signed narcotic contract with our office. If there were any discrepancies this would have been reported to his physician.

## 2017-02-23 NOTE — Telephone Encounter (Signed)
[address]

## 2017-03-01 LAB — DRUG TOX MONITOR 1 W/CONF, ORAL FLD
AMPHETAMINES: NEGATIVE ng/mL (ref <10)
BARBITURATES: NEGATIVE ng/mL (ref <10)
Benzodiazepines: NEGATIVE ng/mL (ref <0.50)
Buprenorphine: NEGATIVE ng/mL (ref <0.025)
CODEINE: NEGATIVE ng/mL (ref <2.5)
Cocaine: NEGATIVE ng/mL (ref <2.5)
Dihydrocodeine: NEGATIVE ng/mL (ref <2.5)
Fentanyl: NEGATIVE ng/mL (ref <0.10)
HEROIN METABOLITE: NEGATIVE ng/mL (ref <1.0)
Hydrocodone: NEGATIVE ng/mL (ref <2.5)
Hydromorphone: NEGATIVE ng/mL (ref <2.5)
MDMA: NEGATIVE ng/mL (ref <10)
MEPERIDINE: NEGATIVE ng/mL (ref <5.0)
Marijuana: NEGATIVE ng/mL (ref <2.5)
Meprobamate: NEGATIVE ng/mL (ref <2.5)
Methadone: NEGATIVE ng/mL (ref <5.0)
Morphine: NEGATIVE ng/mL (ref <2.5)
NICOTINE METABOLITE: NEGATIVE ng/mL (ref <5.0)
Norhydrocodone: NEGATIVE ng/mL (ref <2.5)
Noroxycodone: 28.5 ng/mL — ABNORMAL HIGH (ref <2.5)
OPIATES: POSITIVE ng/mL — AB (ref <2.5)
OXYCODONE: 126.4 ng/mL — AB (ref <2.5)
OXYMORPHONE: NEGATIVE ng/mL (ref <2.5)
PHENCYCLIDINE: NEGATIVE ng/mL (ref <10)
Propoxyphene: NEGATIVE ng/mL (ref <5.0)
Tapentadol: NEGATIVE ng/mL (ref <5.0)
Tramadol: >500 ng/mL — ABNORMAL HIGH (ref <5.0)
Tramadol: POSITIVE ng/mL — AB (ref <5.0)
ZOLPIDEM: NEGATIVE ng/mL (ref <5.0)

## 2017-03-01 LAB — DRUG TOX ALC METAB W/CON, ORAL FLD: Alcohol Metabolite: NEGATIVE ng/mL (ref <25)

## 2017-03-06 ENCOUNTER — Telehealth: Payer: Self-pay | Admitting: *Deleted

## 2017-03-06 NOTE — Telephone Encounter (Signed)
Oral swab drug screen was consistent for prescribed medications.  ?

## 2017-03-20 ENCOUNTER — Other Ambulatory Visit: Payer: Self-pay | Admitting: Physical Medicine & Rehabilitation

## 2017-03-22 ENCOUNTER — Encounter: Payer: Self-pay | Admitting: Registered Nurse

## 2017-03-22 ENCOUNTER — Encounter: Payer: Medicare Other | Attending: Registered Nurse | Admitting: Registered Nurse

## 2017-03-22 VITALS — BP 115/75 | HR 76

## 2017-03-22 DIAGNOSIS — J449 Chronic obstructive pulmonary disease, unspecified: Secondary | ICD-10-CM | POA: Diagnosis not present

## 2017-03-22 DIAGNOSIS — E7800 Pure hypercholesterolemia, unspecified: Secondary | ICD-10-CM | POA: Insufficient documentation

## 2017-03-22 DIAGNOSIS — I2510 Atherosclerotic heart disease of native coronary artery without angina pectoris: Secondary | ICD-10-CM | POA: Diagnosis not present

## 2017-03-22 DIAGNOSIS — E1140 Type 2 diabetes mellitus with diabetic neuropathy, unspecified: Secondary | ICD-10-CM

## 2017-03-22 DIAGNOSIS — Z5181 Encounter for therapeutic drug level monitoring: Secondary | ICD-10-CM | POA: Diagnosis not present

## 2017-03-22 DIAGNOSIS — M1990 Unspecified osteoarthritis, unspecified site: Secondary | ICD-10-CM | POA: Diagnosis not present

## 2017-03-22 DIAGNOSIS — G894 Chronic pain syndrome: Secondary | ICD-10-CM | POA: Diagnosis not present

## 2017-03-22 DIAGNOSIS — K219 Gastro-esophageal reflux disease without esophagitis: Secondary | ICD-10-CM | POA: Diagnosis not present

## 2017-03-22 DIAGNOSIS — Z79899 Other long term (current) drug therapy: Secondary | ICD-10-CM

## 2017-03-22 DIAGNOSIS — G609 Hereditary and idiopathic neuropathy, unspecified: Secondary | ICD-10-CM | POA: Insufficient documentation

## 2017-03-22 DIAGNOSIS — M109 Gout, unspecified: Secondary | ICD-10-CM | POA: Diagnosis not present

## 2017-03-22 DIAGNOSIS — M79672 Pain in left foot: Secondary | ICD-10-CM | POA: Diagnosis present

## 2017-03-22 MED ORDER — OXYCODONE HCL 10 MG PO TABS: 10.0000 mg | ORAL_TABLET | Freq: Four times a day (QID) | ORAL | 0 refills | Status: DC

## 2017-03-22 MED ORDER — TRAMADOL-ACETAMINOPHEN 37.5-325 MG PO TABS: 1.0000 | ORAL_TABLET | Freq: Three times a day (TID) | ORAL | 3 refills | Status: DC | PRN

## 2017-03-22 NOTE — Progress Notes (Signed)
Subjective:    Patient ID: Tim [name], male    DOB: 01/04/1941, 76 y.o.   MRN: [id number]  HPI:  [name] is a 76year old male who returns for follow upappointmentfor chronic pain and medication refill. He states his pain is located in his lower back and left footHe rates his pain 4. His current exercise regime is walking and using the stationary bike for 30 minutes three days a week.   Oral Swab was on 02/22/2017 it was consistent.   Pain Inventory Average Pain 7 Pain Right Now 4 My pain is sharp, burning, dull, stabbing, tingling and aching  In the last 24 hours, has pain interfered with the following? General activity 7 Relation with others 7 Enjoyment of life 3 What TIME of day is your pain at its worst? night Sleep (in general) Fair  Pain is worse with: walking, bending, standing and some activites Pain improves with: rest, pacing activities, medication, TENS and injections Relief from Meds: 6  Mobility walk with assistance use a cane how many minutes can you walk? 5 ability to climb steps?  yes do you drive?  yes  Function retired I need assistance with the following:  household duties and shopping  Neuro/Psych weakness numbness tremor tingling trouble walking  Prior Studies Any changes since last visit?  no  Physicians involved in your care Any changes since last visit?  no   Family History  Problem Relation Age of Onset  . Hypertension Mother   . Coronary artery disease Mother   . Heart attack Mother   . Neuropathy Mother   . Pulmonary fibrosis Father        asbestosis   Social History   Social History  . Marital status: Married    Spouse name: [name]  . Number of children: 0  . Years of education: 20   Occupational History  . engineering     retired   Social History Main Topics  . Smoking status: Never Smoker  . Smokeless tobacco: Never Used  . Alcohol use No  . Drug use: No  . Sexual activity: Not Currently    Other Topics Concern  . None   Social History Narrative   HSG, [name] - BS, [name] - MS-engineering, 2 years on PhD - [name]. Married - '65 - 57yr/divorced; '76- 3 yrs/divorced; '92 . No children. 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 ACP/Living Will - Yes CPR; long-term Mechanical ventilation as long as he was able to cognate; ok for long term artificial nutrition. Precondition being able to cognate and not to have too much pain.    Past Surgical History:  Procedure Laterality Date  . AMPUTATION Left 04/11/2013   Procedure: AMPUTATION DIGIT Left 3rd toe;  Surgeon: [name] MD;  [facility] [facility]  Service: Orthopedics;  Laterality: Left;  Left 3rd toe amputation at [facility]  . CARDIAC CATHETERIZATION  1997; 03/10/16   1997 Non-obstructive disease.  03/2016 BMS to RCA, with 25% pDiag dz, o/w normal cors per cath 03/07/16.  Cath 11/01/16: in stent restenosis, successful baloon angioplasty.  .Marland KitchenCARDIAC CATHETERIZATION  12/24/2012   mild < 20% LCx, prox 30% RCA; LVEF 55-65% , moderate pulmonary HTN, moderate AS  . CARDIOVASCULAR STRESS TEST  05/11/16 ([facility])   Myocardial perfusion imaging:  No ischemia; scar in apex, global hypokinesis, EF 36%.  . Carotid dopplers  03/09/2016   [facility]: no hemodynamically significant stenosis on either side.  .[name]  CHOLECYSTECTOMY    . COLONOSCOPY    . COLONOSCOPY [name] 10/30/2012   Procedure: COLONOSCOPY;  Surgeon: [name], MD;  Location[facility];  Service: Endoscopy;  Laterality: [name];  . CORONARY ANGIOPLASTY WITH STENT PLACEMENT  03/2016   [facility]: BMS to RCA-pt was placed on Brilinta.  [name] EYE SURGERY Bilateral cataract  . HEMORRHOID SURGERY    . INTRAOCULAR LENS INSERTION Bilateral   . KNEE SURGERY Right   . LEFT AND RIGHT HEART CATHETERIZATION WITH CORONARY ANGIOGRAM [name] 12/24/2012   Procedure: LEFT AND RIGHT HEART CATHETERIZATION WITH CORONARY ANGIOGRAM;  Surgeon: [name], MD;  Location: [facility] CATH LAB;  Service:  Cardiovascular;  Laterality: [name];  . LEG SURGERY Bilateral    lenghtening   . PACEMAKER PLACEMENT  04/13/2016   2nd deg HB after TAVR, pt had DC MDT PPM placed.  [name] SHOULDER ARTHROSCOPY  08/30/2011   Procedure: ARTHROSCOPY SHOULDER;  Surgeon: [name], MD;  Location: [employer];  Service: Orthopedics;  Laterality: Right;  Right Shoulder Arthroscopy, Debridement, and Decompression  . SPINAL CORD STIMULATOR INSERTION [name] 09/10/2015   Procedure: LUMBAR SPINAL CORD STIMULATOR INSERTION;  Surgeon: [name], MD;  Location: [place] NEURO ORS;  Service: Neurosurgery;  Laterality: [name];  . 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  R big toe surg due to osteoarth  . TONSILLECTOMY    . traeculectomy Left    eye  . TRANSCATHETER AORTIC VALVE REPLACEMENT, TRANSFEMORAL  04/11/2016  . TRANSESOPHAGEAL ECHOCARDIOGRAM  03/09/2016   [facility]: EF 55-60%, PFO seen with bi-directional shunting, no thrombus in appendage.  . TRANSTHORACIC ECHOCARDIOGRAM  01/2015; 01/2016; 05/18/16   01/2015 No signif change in aortic stenosis (moderate).  01/2016 Severe LVH w/small LV cavity, EF 60-65%, grade I diast dysfxn.  05/2016 (s/p TAVR): EF 50-55%, grd I DD, biopros AV good.  [name] VITRECTOMY     Past Medical History:  Diagnosis Date  . ASTHMA   . BPH (benign prostatic hypertrophy)   . CAD (coronary artery disease)    Nonobstructive by 12/2012 cath; then 03/2016 he required BMS to RCA ([facility]).  In-stent restenosis on cath 11/01/16, baloon angioplasty successful--pt to take plavix (no ASA), + eliquis (for PAF).  [name] CATARACT, HX OF   . Chronic combined systolic and diastolic CHF (congestive heart failure) ([place]) 05/2016   Mild; [facility]  . COPD   . Elevated transaminase level 02/2016   Suspect due to fatty liver (documented on u/s 2007). Plan repeat labs 03/2016.  .Marland KitchenEssential hypertension, benign   . Fatty liver 2007   u/s showed fatty liver with hepatosplenomegaly  . GERD  (gastroesophageal reflux disease)   . Glaucoma   . GOUT   . Hallux valgus (acquired)   . HH (hiatus hernia)   . HYPERCHOLESTEROLEMIA-PURE   . Hypogonadism male   . IFG (impaired fasting glucose)    HbA1c jumped from 5.7% to 6.1% 03/2015---started metformin at that time.  . Lumbosacral neuritis   . Lumbosacral spondylosis    Lumbar spinal stenosis with neurogenic claudication--contributes to his chronic pain syndrome  . Normal memory function 08/2014   Neuropsychological testing ([facility]): no cognitive impairment or sign of neurodegenerative disorder.  Likely has adjustment d/o with mixed anxiety/depressed features and may benefit from low dose SNRI.    .Marland KitchenNormocytic anemia 03/2016   Mild-pt needs ferritin and vit B12 level checked (as of 03/22/16)  . NSTEMI (non-ST elevated myocardial infarction) ([facility] 03/20/2016  BMS to RCA  . Obesity hypoventilation syndrome ([place])   . Obesity, unspecified   . Orthostatic hypotension   . OSA on CPAP    8 cm H2O  . OSTEOARTHRITIS   . Osteoarthrosis, unspecified whether generalized or localized, unspecified site   . Paroxysmal atrial fibrillation ([place]) 2003    (? chronic?) Off anticoag for a while due to falls.  Then apixaban started 12/2014.  [name] Peripheral neuropathy    Heredetary; with chronic neuropathic pain (Dr. Ella Bodo)  . Personal history of colonic adenoma 10/30/2012   Diminutive adenoma, consider repeat 2019 per GI  . PUD (peptic ulcer disease)   . PULMONARY HYPERTENSION, HX OF   . Secondary male hypogonadism 2017  . 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 Shortness of breath    with exertion: much improved s/p TAVR and treatment for CHF.  [name] Unspecified glaucoma(365.9)   . Unspecified hereditary and idiopathic peripheral neuropathy approx age 68   bilat LE's, ? left arm, too.  Feet became progressively numb + left foot pain intermittently.  Pt may be trying a  spinal stimulator (as of 05/2015)  . VENOUS INSUFFICIENCY    Being followed by Dr. Sharol Given as of 10/2016 for two R LL venous stasis ulcers/skin tears.  Healed as of 10/30/16 f/u with Dr. Sharol Given.  . VENTRAL HERNIA    BP 115/75 (BP Location: Left Arm, Patient Position: Sitting, Cuff Size: Large)   Pulse 76   SpO2 96%   Opioid Risk Score:   Fall Risk Score:  `1  Depression screen PHQ 2/9  Depression screen [name] 2/9 02/06/2017 01/25/2017 09/23/2015 07/15/2015 03/16/2015 02/16/2015 09/16/2014  Decreased Interest 0 0 0 0 0 0 1  Down, Depressed, Hopeless 0 0 0 0 0 0 0  PHQ - 2 Score 0 0 0 0 0 0 1  Altered sleeping - - - - - - 2  Tired, decreased energy - - - - - - 2  Change in appetite - - - - - - 1  Feeling bad or failure about yourself  - - - - - - 0  Trouble concentrating - - - - - - 0  Moving slowly or fidgety/restless - - - - - - 0  Suicidal thoughts - - - - - - 0  PHQ-9 Score - - - - - - 6  Some recent data might be hidden     Review of Systems  HENT: Negative.   Eyes: Negative.   Respiratory: Positive for apnea and shortness of breath.   Cardiovascular: Negative.   Gastrointestinal: Positive for constipation.  Endocrine: Negative.   Genitourinary: Negative.   Musculoskeletal: Positive for arthralgias, back pain, gait problem and joint swelling.  Skin: Negative.   Allergic/Immunologic: Negative.   Neurological: Positive for tremors, weakness and numbness.       Tingling  Hematological: Negative.   Psychiatric/Behavioral: Negative.   All other systems reviewed and are negative.      Objective:   Physical Exam  Constitutional: He is oriented to person, place, and time. He appears well-developed and well-nourished.  HENT:  Head: Normocephalic and atraumatic.  Neck: Normal range of motion. Neck supple.  Cardiovascular: Normal rate and regular rhythm.   Pulmonary/Chest: Effort normal and breath sounds normal.  Musculoskeletal:  Normal Muscle Bulk and Muscle Testing Reveals:    Upper Extremities: Right: Decreased ROM 45 Degrees and Muscle Strength 4/5 Left:Full  ROM and Muscle Strength 5/5 Back without spinal tenderness noted Lower Extremities: Full ROM and Muscle Strength 5/5 Arises from chair slowly using straight cane for support Narrow Based gait    Neurological: He is alert and oriented to person, place, and time.  Skin: Skin is warm and dry.  Psychiatric: He has a normal mood and affect.  Nursing note and vitals reviewed.         Assessment & Plan:  1. Diabetic neuropathy with chronic neuropathic pain: Using Tens Unit: Continue to Monitor. Continue Gabapentin and encouraged to keep pain journal and start weaning  Tramadol to BID as needed for pain. 03/22/2017 Continue:Oxycodone 10 mg one tablet QID#120.  We will continue the opioid monitoring program, this consists of regular clinic visits, examinations, urine drug screen, pill counts as well as use of [place] Controlled Substance Reporting System. 2. DDD lumbar spine with LBP: Neurology Following. No complaints of back pain today. Continue to increase activity as tolerated and heat therapy. 03/22/2017 S/P Permanent Spinal Stimulator Placed 09/10/15 via Dr. Maryjean Ka.  20 minutes of face to face patient care time was spent during this visit. All questions were encouraged and answered.  F/U in 1 month

## 2017-04-04 ENCOUNTER — Other Ambulatory Visit: Payer: Self-pay | Admitting: Family Medicine

## 2017-04-16 ENCOUNTER — Encounter: Payer: Self-pay | Admitting: Family Medicine

## 2017-04-16 ENCOUNTER — Telehealth: Payer: Self-pay | Admitting: Family Medicine

## 2017-04-16 MED ORDER — METOPROLOL SUCCINATE ER 50 MG PO TB24: ORAL_TABLET | ORAL | 1 refills | Status: DC

## 2017-04-16 NOTE — Telephone Encounter (Signed)
Patient states pharmacy has been attempting to get refill of metoprolol succinate (TOPROL-XL) 50 MG 24 hr tablet since last week.  He is requesting a call back with status of refill request.  He also needs to know if he has had his flu vaccine this year or not.  Pharmacy:  [facility] [place], [address] [place] [phone] (Phone) [phone] (Fax)

## 2017-04-16 NOTE — Telephone Encounter (Signed)
SW pt and Rx sent to [facility]. Advised pt that he has not had flu shot this year. He stated that he will get on at the pharmacy.

## 2017-04-25 ENCOUNTER — Other Ambulatory Visit: Payer: Self-pay | Admitting: Family Medicine

## 2017-04-26 ENCOUNTER — Encounter: Payer: Self-pay | Admitting: Registered Nurse

## 2017-04-26 ENCOUNTER — Encounter: Payer: Medicare Other | Attending: Registered Nurse | Admitting: Registered Nurse

## 2017-04-26 VITALS — BP 125/75 | HR 69 | Resp 14

## 2017-04-26 DIAGNOSIS — G609 Hereditary and idiopathic neuropathy, unspecified: Secondary | ICD-10-CM | POA: Insufficient documentation

## 2017-04-26 DIAGNOSIS — M48062 Spinal stenosis, lumbar region with neurogenic claudication: Secondary | ICD-10-CM

## 2017-04-26 DIAGNOSIS — E7800 Pure hypercholesterolemia, unspecified: Secondary | ICD-10-CM | POA: Insufficient documentation

## 2017-04-26 DIAGNOSIS — K219 Gastro-esophageal reflux disease without esophagitis: Secondary | ICD-10-CM | POA: Diagnosis not present

## 2017-04-26 DIAGNOSIS — M792 Neuralgia and neuritis, unspecified: Secondary | ICD-10-CM

## 2017-04-26 DIAGNOSIS — M1990 Unspecified osteoarthritis, unspecified site: Secondary | ICD-10-CM | POA: Diagnosis not present

## 2017-04-26 DIAGNOSIS — G894 Chronic pain syndrome: Secondary | ICD-10-CM

## 2017-04-26 DIAGNOSIS — G5792 Unspecified mononeuropathy of left lower limb: Secondary | ICD-10-CM

## 2017-04-26 DIAGNOSIS — Z5181 Encounter for therapeutic drug level monitoring: Secondary | ICD-10-CM | POA: Diagnosis not present

## 2017-04-26 DIAGNOSIS — J449 Chronic obstructive pulmonary disease, unspecified: Secondary | ICD-10-CM | POA: Insufficient documentation

## 2017-04-26 DIAGNOSIS — I2510 Atherosclerotic heart disease of native coronary artery without angina pectoris: Secondary | ICD-10-CM | POA: Diagnosis not present

## 2017-04-26 DIAGNOSIS — M109 Gout, unspecified: Secondary | ICD-10-CM | POA: Insufficient documentation

## 2017-04-26 DIAGNOSIS — E1140 Type 2 diabetes mellitus with diabetic neuropathy, unspecified: Secondary | ICD-10-CM

## 2017-04-26 DIAGNOSIS — M79672 Pain in left foot: Secondary | ICD-10-CM | POA: Diagnosis present

## 2017-04-26 DIAGNOSIS — Z79899 Other long term (current) drug therapy: Secondary | ICD-10-CM

## 2017-04-26 MED ORDER — OXYCODONE HCL 10 MG PO TABS: 10.0000 mg | ORAL_TABLET | Freq: Three times a day (TID) | ORAL | 0 refills | Status: DC | PRN

## 2017-04-26 NOTE — Progress Notes (Signed)
Subjective:    Patient ID: Tim [name], male    DOB: [birth year], 76 y.o.   MRN: [id number]  HPI:  [name] is a 76year old male who returns for follow upappointmentfor chronic pain and medication refill. He states his pain is located in his lower back and left footHe rates his pain 5. His current exercise regime is walking and using the stationary bike for 30 minutes QOD.   [name] was able to decrease his Oxycodone to TID as needed for the month of September. We will continue with weaning, Oxycodone will be decrease from 120 tablets to 100 tablets, he voiced concern about having pain during the night. If his pain is controlled we will decrease to 90 tablets next month. He verbalizes understanding. Encouraged to continue with weaning of Tramadol goal BID as needed, he verbalizes understanding.   [name] Morphine equivalent is  71.25 MME.    [name] is scheduled for Left Herat Catherization at [facility] with Dr. Radford Pax on 04/27/2017.   Oral Swab was on 02/22/2017 it was consistent. Oral Swab performed today.   Pain Inventory Average Pain 3 Pain Right Now 5 My pain is sharp, burning, dull, stabbing, tingling and aching  In the last 24 hours, has pain interfered with the following? General activity 7 Relation with others 8 Enjoyment of life 3 What TIME of day is your pain at its worst? night Sleep (in general) Fair  Pain is worse with: walking, bending, standing and some activites Pain improves with: rest, pacing activities, medication, TENS and injections Relief from Meds: 7  Mobility walk with assistance use a cane how many minutes can you walk? 5 ability to climb steps?  no do you drive?  yes  Function retired I need assistance with the following:  household duties and shopping  Neuro/Psych weakness numbness tremor tingling trouble walking spasms  Prior Studies Any changes since last visit?  no  Physicians involved in your care Any  changes since last visit?  no   Family History  Problem Relation Age of Onset  . Hypertension Mother   . Coronary artery disease Mother   . Heart attack Mother   . Neuropathy Mother   . Pulmonary fibrosis Father        asbestosis   Social History   Social History  . Marital status: Married    Spouse name: [name]  . Number of children: 0  . Years of education: 20   Occupational History  . engineering     retired   Social History Main Topics  . Smoking status: Never Smoker  . Smokeless tobacco: Never Used  . Alcohol use No  . Drug use: No  . Sexual activity: Not Currently   Other Topics Concern  . None   Social History Narrative   HSG, [name] - BS, [name] - MS-engineering, 2 years on PhD - [name]. Married - '65 - 34yr/divorced; '76- 3 yrs/divorced; '92 . No children. 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 ACP/Living Will - Yes CPR; long-term Mechanical ventilation as long as he was able to cognate; ok for long term artificial nutrition. Precondition being able to cognate and not to have too much pain.    Past Surgical History:  Procedure Laterality Date  . AMPUTATION Left 04/11/2013   Procedure: AMPUTATION DIGIT Left 3rd toe;  Surgeon: [name] MD;  [place]  Service: Orthopedics;  Laterality: Left;  Left 3rd  toe amputation at [facility]  . CARDIAC CATHETERIZATION  1997; 03/10/16   1997 Non-obstructive disease.  03/2016 BMS to RCA, with 25% pDiag dz, o/w normal cors per cath 03/07/16.  Cath 11/01/16: in stent restenosis, successful baloon angioplasty.  [name] CARDIAC CATHETERIZATION  12/24/2012   mild < 20% LCx, prox 30% RCA; LVEF 55-65% , moderate pulmonary HTN, moderate AS  . CARDIOVASCULAR STRESS TEST  05/11/16 ([facility])   Myocardial perfusion imaging:  No ischemia; scar in apex, global hypokinesis, EF 36%.  . Carotid dopplers  03/09/2016   [facility]: no hemodynamically significant stenosis on either side.  . CHOLECYSTECTOMY    .  COLONOSCOPY    . COLONOSCOPY [name] 10/30/2012   Procedure: COLONOSCOPY;  Surgeon: [name], MD;  Location: [facility];  Service: Endoscopy;  Laterality: [name];  . CORONARY ANGIOPLASTY WITH STENT PLACEMENT  03/2016   [facility]: BMS to RCA-pt was placed on Brilinta.  [name] EYE SURGERY Bilateral cataract  . HEMORRHOID SURGERY    . INTRAOCULAR LENS INSERTION Bilateral   . KNEE SURGERY Right   . LEFT AND RIGHT HEART CATHETERIZATION WITH CORONARY ANGIOGRAM [name] 12/24/2012   Procedure: LEFT AND RIGHT HEART CATHETERIZATION WITH CORONARY ANGIOGRAM;  Surgeon: [name], MD;  Location: [facility] CATH LAB;  Service: Cardiovascular;  Laterality: [name];  . LEG SURGERY Bilateral    lenghtening   . PACEMAKER PLACEMENT  04/13/2016   2nd deg HB after TAVR, pt had DC MDT PPM placed.  [name] SHOULDER ARTHROSCOPY  08/30/2011   Procedure: ARTHROSCOPY SHOULDER;  Surgeon: [name], MD;  Location: [place];  Service: Orthopedics;  Laterality: Right;  Right Shoulder Arthroscopy, Debridement, and Decompression  . SPINAL CORD STIMULATOR INSERTION [name] 09/10/2015   Procedure: LUMBAR SPINAL CORD STIMULATOR INSERTION;  Surgeon: [name], MD;  Location: [place] NEURO ORS;  Service: Neurosurgery;  Laterality: [name];  . 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  R big toe surg due to osteoarth  . TONSILLECTOMY    . traeculectomy Left    eye  . TRANSCATHETER AORTIC VALVE REPLACEMENT, TRANSFEMORAL  04/11/2016  . TRANSESOPHAGEAL ECHOCARDIOGRAM  03/09/2016   [facility]: EF 55-60%, PFO seen with bi-directional shunting, no thrombus in appendage.  . TRANSTHORACIC ECHOCARDIOGRAM  01/2015; 01/2016; 05/18/16   01/2015 No signif change in aortic stenosis (moderate).  01/2016 Severe LVH w/small LV cavity, EF 60-65%, grade I diast dysfxn.  05/2016 (s/p TAVR): EF 50-55%, grd I DD, biopros AV good.  [name] VITRECTOMY     Past Medical History:  Diagnosis Date  . ASTHMA   . BPH (benign prostatic hypertrophy)   . CAD (coronary artery disease)     Nonobstructive by 12/2012 cath; then 03/2016 he required BMS to RCA ([facility]).  In-stent restenosis on cath 11/01/16, baloon angioplasty successful--pt to take plavix (no ASA), + eliquis (for PAF).  [name] CATARACT, HX OF   . Chronic combined systolic and diastolic CHF (congestive heart failure) ([place]) 05/2016   Mild; [facility]  . COPD   . Elevated transaminase level 02/2016   Suspect due to fatty liver (documented on u/s 2007). Plan repeat labs 03/2016.  .Marland KitchenEssential hypertension, benign   . Fatty liver 2007   u/s showed fatty liver with hepatosplenomegaly  . GERD (gastroesophageal reflux disease)   . Glaucoma   . GOUT   . Hallux valgus (acquired)   . HH (hiatus hernia)   . HYPERCHOLESTEROLEMIA-PURE   . Hypogonadism male   . IFG (impaired fasting  glucose)    HbA1c jumped from 5.7% to 6.1% 03/2015---started metformin at that time.  . Lumbosacral neuritis   . Lumbosacral spondylosis    Lumbar spinal stenosis with neurogenic claudication--contributes to his chronic pain syndrome  . Normal memory function 08/2014   Neuropsychological testing ([facility]): no cognitive impairment or sign of neurodegenerative disorder.  Likely has adjustment d/o with mixed anxiety/depressed features and may benefit from low dose SNRI.    [name] Normocytic anemia 03/2016   Mild-pt needs ferritin and vit B12 level checked (as of 03/22/16)  . NSTEMI (non-ST elevated myocardial infarction) ([place]) 03/20/2016   BMS to RCA  . Obesity hypoventilation syndrome ([place])   . Obesity, unspecified   . Orthostatic hypotension   . OSA on CPAP    8 cm H2O  . OSTEOARTHRITIS   . Osteoarthrosis, unspecified whether generalized or localized, unspecified site   . Paroxysmal atrial fibrillation ([place]) 2003    (? chronic?) Off anticoag for a while due to falls.  Then apixaban started 12/2014.  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 Ella Bodo)  .  Personal history of colonic adenoma 10/30/2012   Diminutive adenoma, consider repeat 2019 per GI  . PUD (peptic ulcer disease)   . PULMONARY HYPERTENSION, HX OF   . Secondary male hypogonadism 2017  . 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 Shortness of breath    with exertion: much improved s/p TAVR and treatment for CHF.  [name] Unspecified glaucoma(365.9)   . Unspecified hereditary and idiopathic peripheral neuropathy approx age 49   bilat LE's, ? left arm, too.  Feet became progressively numb + left foot pain intermittently.  Pt may be trying a spinal stimulator (as of 05/2015)  . VENOUS INSUFFICIENCY    Being followed by Dr. Sharol Given as of 10/2016 for two R LL venous stasis ulcers/skin tears.  Healed as of 10/30/16 f/u with Dr. Sharol Given.  . VENTRAL HERNIA    BP 125/75 (BP Location: Left Wrist, Patient Position: Sitting, Cuff Size: Normal)   Pulse 69   Resp 14   SpO2 94%   Opioid Risk Score:   Fall Risk Score:  `1  Depression screen PHQ 2/9  Depression screen [facility] 2/9 02/06/2017 01/25/2017 09/23/2015 07/15/2015 03/16/2015 02/16/2015 09/16/2014  Decreased Interest 0 0 0 0 0 0 1  Down, Depressed, Hopeless 0 0 0 0 0 0 0  PHQ - 2 Score 0 0 0 0 0 0 1  Altered sleeping - - - - - - 2  Tired, decreased energy - - - - - - 2  Change in appetite - - - - - - 1  Feeling bad or failure about yourself  - - - - - - 0  Trouble concentrating - - - - - - 0  Moving slowly or fidgety/restless - - - - - - 0  Suicidal thoughts - - - - - - 0  PHQ-9 Score - - - - - - 6  Some recent data might be hidden     Review of Systems  HENT: Negative.   Eyes: Negative.   Respiratory: Positive for apnea and shortness of breath.   Cardiovascular: Positive for leg swelling.  Gastrointestinal: Positive for constipation.  Endocrine: Negative.        High blood sugar  Genitourinary: Negative.   Musculoskeletal: Positive for arthralgias, back pain, gait problem and  joint swelling.  Skin: Negative.   Allergic/Immunologic: Negative.   Neurological: Positive for tremors, weakness and numbness.       Tingling  Hematological: Bruises/bleeds easily.  Psychiatric/Behavioral: Negative.   All other systems reviewed and are negative.      Objective:   Physical Exam  Constitutional: He is oriented to person, place, and time. He appears well-developed and well-nourished.  HENT:  Head: Normocephalic and atraumatic.  Neck: Normal range of motion. Neck supple.  Cardiovascular: Normal rate and regular rhythm.   Pulmonary/Chest: Effort normal and breath sounds normal.  Musculoskeletal:  Normal Muscle Bulk and Muscle Testing Reveals:  Upper Extremities: Right: Decreased ROM 45 Degrees and Muscle Strength 4/5 Left:Full  ROM and Muscle Strength 5/5 Back without spinal tenderness noted Lower Extremities: Full ROM and Muscle Strength 5/5 Arises from chair slowly using straight cane for support Narrow Based gait    Neurological: He is alert and oriented to person, place, and time.  Skin: Skin is warm and dry.  Psychiatric: He has a normal mood and affect.  Nursing note and vitals reviewed.         Assessment & Plan:  1. Diabetic neuropathy with chronic neuropathic pain: Using Tens Unit: Continue to Monitor. Continue Gabapentin and encouraged to keep pain journal and start weaning  Tramadol to BID as needed for pain. 04/26/2017 Continue:Oxycodone 10 mg one tablet QID#120.  We will continue the opioid monitoring program, this consists of regular clinic visits, examinations, urine drug screen, pill counts as well as use of [place] Controlled Substance Reporting System. 2. DDD lumbar spine with LBP: Continue to increase activity as tolerated and heat therapy. 04/26/2017 S/P Permanent Spinal Stimulator Placed 09/10/15 via Dr. Maryjean Ka.  20 minutes of face to face patient care time was spent during this visit. All questions were encouraged and  answered.  F/U in 1 month

## 2017-04-28 LAB — BASIC METABOLIC PANEL
BUN: 17 (ref 4–21)
CREATININE: 0.7 (ref 0.6–1.3)
Glucose: 130
POTASSIUM: 4.2 (ref 3.4–5.3)
Sodium: 145 (ref 137–147)

## 2017-04-28 LAB — HEPATIC FUNCTION PANEL
ALT: 22 (ref 10–40)
AST: 24 (ref 14–40)
Alkaline Phosphatase: 64 (ref 25–125)
Bilirubin, Total: 1.2

## 2017-04-28 LAB — CBC AND DIFFERENTIAL
HCT: 34 — AB (ref 41–53)
HEMOGLOBIN: 11.1 — AB (ref 13.5–17.5)
Platelets: 89 — AB (ref 150–399)
WBC: 5.1

## 2017-05-01 ENCOUNTER — Other Ambulatory Visit: Payer: Self-pay | Admitting: Cardiology

## 2017-05-01 DIAGNOSIS — I4891 Unspecified atrial fibrillation: Secondary | ICD-10-CM

## 2017-05-01 LAB — DRUG TOX MONITOR 1 W/CONF, ORAL FLD
AMPHETAMINES: NEGATIVE ng/mL (ref <10)
BARBITURATES: NEGATIVE ng/mL (ref <10)
BENZODIAZEPINES: NEGATIVE ng/mL (ref <0.50)
Buprenorphine: NEGATIVE ng/mL (ref <0.10)
COCAINE: NEGATIVE ng/mL (ref <5.0)
Codeine: NEGATIVE ng/mL (ref <2.5)
Dihydrocodeine: NEGATIVE ng/mL (ref <2.5)
Fentanyl: NEGATIVE ng/mL (ref <0.10)
HYDROCODONE: NEGATIVE ng/mL (ref <2.5)
HYDROMORPHONE: NEGATIVE ng/mL (ref <2.5)
Heroin Metabolite: NEGATIVE ng/mL (ref <1.0)
MARIJUANA: NEGATIVE ng/mL (ref <2.5)
MDMA: NEGATIVE ng/mL (ref <10)
MEPROBAMATE: NEGATIVE ng/mL (ref <2.5)
MORPHINE: NEGATIVE ng/mL (ref <2.5)
Methadone: NEGATIVE ng/mL (ref <5.0)
NOROXYCODONE: NEGATIVE ng/mL (ref <2.5)
Nicotine Metabolite: NEGATIVE ng/mL (ref <5.0)
Norhydrocodone: NEGATIVE ng/mL (ref <2.5)
OPIATES: POSITIVE ng/mL — AB (ref <2.5)
Oxycodone: 18.8 ng/mL — ABNORMAL HIGH (ref <2.5)
Oxymorphone: NEGATIVE ng/mL (ref <2.5)
PHENCYCLIDINE: NEGATIVE ng/mL (ref <10)
TAPENTADOL: NEGATIVE ng/mL (ref <5.0)
TRAMADOL: 139.6 ng/mL — AB (ref <5.0)
Tramadol: POSITIVE ng/mL — AB (ref <5.0)
ZOLPIDEM: NEGATIVE ng/mL (ref <5.0)

## 2017-05-01 LAB — DRUG TOX ALC METAB W/CON, ORAL FLD: Alcohol Metabolite: NEGATIVE ng/mL (ref <25)

## 2017-05-03 ENCOUNTER — Telehealth: Payer: Self-pay | Admitting: *Deleted

## 2017-05-03 NOTE — Telephone Encounter (Signed)
Oral swab drug screen was consistent for prescribed medications.  ?

## 2017-05-06 ENCOUNTER — Other Ambulatory Visit: Payer: Self-pay | Admitting: Cardiology

## 2017-05-06 DIAGNOSIS — I4891 Unspecified atrial fibrillation: Secondary | ICD-10-CM

## 2017-05-07 ENCOUNTER — Other Ambulatory Visit: Payer: Self-pay | Admitting: Cardiology

## 2017-05-07 DIAGNOSIS — I4891 Unspecified atrial fibrillation: Secondary | ICD-10-CM

## 2017-05-22 ENCOUNTER — Encounter: Payer: Medicare Other | Attending: Registered Nurse

## 2017-05-22 ENCOUNTER — Ambulatory Visit (HOSPITAL_BASED_OUTPATIENT_CLINIC_OR_DEPARTMENT_OTHER): Payer: Medicare Other | Admitting: Physical Medicine & Rehabilitation

## 2017-05-22 ENCOUNTER — Other Ambulatory Visit: Payer: Self-pay

## 2017-05-22 ENCOUNTER — Encounter: Payer: Self-pay | Admitting: Physical Medicine & Rehabilitation

## 2017-05-22 VITALS — BP 117/78 | HR 72

## 2017-05-22 DIAGNOSIS — M79672 Pain in left foot: Secondary | ICD-10-CM | POA: Diagnosis present

## 2017-05-22 DIAGNOSIS — M109 Gout, unspecified: Secondary | ICD-10-CM | POA: Insufficient documentation

## 2017-05-22 DIAGNOSIS — G609 Hereditary and idiopathic neuropathy, unspecified: Secondary | ICD-10-CM | POA: Insufficient documentation

## 2017-05-22 DIAGNOSIS — J449 Chronic obstructive pulmonary disease, unspecified: Secondary | ICD-10-CM | POA: Diagnosis not present

## 2017-05-22 DIAGNOSIS — E7800 Pure hypercholesterolemia, unspecified: Secondary | ICD-10-CM | POA: Insufficient documentation

## 2017-05-22 DIAGNOSIS — I2510 Atherosclerotic heart disease of native coronary artery without angina pectoris: Secondary | ICD-10-CM | POA: Diagnosis not present

## 2017-05-22 DIAGNOSIS — M48062 Spinal stenosis, lumbar region with neurogenic claudication: Secondary | ICD-10-CM

## 2017-05-22 DIAGNOSIS — M1990 Unspecified osteoarthritis, unspecified site: Secondary | ICD-10-CM | POA: Insufficient documentation

## 2017-05-22 DIAGNOSIS — K219 Gastro-esophageal reflux disease without esophagitis: Secondary | ICD-10-CM | POA: Diagnosis not present

## 2017-05-22 MED ORDER — OXYCODONE HCL 10 MG PO TABS: 10.0000 mg | ORAL_TABLET | Freq: Three times a day (TID) | ORAL | 0 refills | Status: DC | PRN

## 2017-05-22 NOTE — Patient Instructions (Signed)
If you are consistently taking 3 tabs a day of oxy IR, may reduce to 90 tabs per month

## 2017-05-22 NOTE — Progress Notes (Signed)
Subjective:    Patient ID: Tim [name], male    DOB: 12-30-1940, 77 y.o.   MRN: [id number]  HPI  Chief complaint remains left greater than right foot pain as well as back pain.  Overall he is doing a little bit better and is trying to cut back on his oxycodone.  He has been taking 10 mg 4 times per day and now has cut down to 3 times a day.  Interval medical history hospitalization in October for angina which required a placement of a right coronary artery drug-eluting stent  SCS 15% pain reduced Gabapentin 680m QID Oxycodone 1106mTID  Mod I dressing and mobility with cane outside the home only  Pain improved, wt loss of 60lb in 1 year Walks only in home Recumbent stationary bike 30 min every other day Reviewed PMP aware website including Narc score and overdose risk. Pain Inventory Average Pain 6 Pain Right Now 4 My pain is sharp, burning, dull, stabbing, tingling and aching  In the last 24 hours, has pain interfered with the following? General activity 7 Relation with others 7 Enjoyment of life 4 What TIME of day is your pain at its worst? night Sleep (in general) Poor  Pain is worse with: walking, bending, standing and some activites Pain improves with: rest, pacing activities, medication and TENS Relief from Meds: 6  Mobility use a cane ability to climb steps?  no do you drive?  yes  Function retired I need assistance with the following:  household duties and shopping  Neuro/Psych No problems in this area  Prior Studies Any changes since last visit?  no  Physicians involved in your care Any changes since last visit?  no   Family History  Problem Relation Age of Onset  . Hypertension Mother   . Coronary artery disease Mother   . Heart attack Mother   . Neuropathy Mother   . Pulmonary fibrosis Father        asbestosis   Social History   Socioeconomic History  . Marital status: Married    Spouse name: Not on file  . Number of children: 0    . Years of education: 2018. Highest education level: Not on file  Social Needs  . Financial resource strain: Not on file  . Food insecurity - worry: Not on file  . Food insecurity - inability: Not on file  . Transportation needs - medical: Not on file  . Transportation needs - non-medical: Not on file  Occupational History  . Occupation: enEngineer, production  Comment: retired  Tobacco Use  . Smoking status: Never Smoker  . Smokeless tobacco: Never Used  Substance and Sexual Activity  . Alcohol use: No    Alcohol/week: 0.0 oz  . Drug use: No  . Sexual activity: Not Currently  Other Topics Concern  . Not on file  Social History Narrative   HSG, [name] - BS, [name] - MS-engineering, 2 years on PhD [place] - [place] - 8y33yrivorced; '76- 3 yrs/divorced; '92 . No children. 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 ACP/Living Will - Yes CPR; long-term Mechanical ventilation as long as he was able to cognate; ok for long term artificial nutrition. Precondition being able to cognate and not to have too much pain.    Past Surgical History:  Procedure Laterality Date  . AMPUTATION DIGIT Left 3rd toe Left 04/11/2013   Performed by DudNewt MinionD at [facility]  ARTHROSCOPY SHOULDER Right 08/30/2011   Performed by [name], MD at [facility]  . CARDIAC CATHETERIZATION  1997; 03/10/16   1997 Non-obstructive disease.  03/2016 BMS to RCA, with 25% pDiag dz, o/w normal cors per cath 03/07/16.  Cath 11/01/16: in stent restenosis, successful baloon angioplasty.  [name] CARDIAC CATHETERIZATION  12/24/2012   mild < 20% LCx, prox 30% RCA; LVEF 55-65% , moderate pulmonary HTN, moderate AS  . CARDIOVASCULAR STRESS TEST  05/11/16 ([facility])   Myocardial perfusion imaging:  No ischemia; scar in apex, global hypokinesis, EF 36%.  . Carotid dopplers  03/09/2016   [facility]: no hemodynamically significant stenosis on either side.  . CHOLECYSTECTOMY    . COLONOSCOPY    . COLONOSCOPY [name]  10/30/2012   Performed by [name], MD at [facility]  . CORONARY ANGIOPLASTY WITH STENT PLACEMENT  03/2016   [facility]: BMS to RCA-pt was placed on Brilinta.  [name] EYE SURGERY Bilateral cataract  . HEMORRHOID SURGERY    . INTRAOCULAR LENS INSERTION Bilateral   . KNEE SURGERY Right   . LEFT AND RIGHT HEART CATHETERIZATION WITH CORONARY ANGIOGRAM [name] 12/24/2012   Performed by Martinique, Peter M, MD at [facility] CATH LAB  . LEG SURGERY Bilateral    lenghtening   . LUMBAR SPINAL CORD STIMULATOR INSERTION [name] 09/10/2015   Performed by [name], MD at [name] NEURO ORS  . PACEMAKER PLACEMENT  04/13/2016   2nd deg HB after TAVR, pt had DC MDT PPM placed.  . 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  R big toe surg due to osteoarth  . TONSILLECTOMY    . traeculectomy Left    eye  . TRANSCATHETER AORTIC VALVE REPLACEMENT, TRANSFEMORAL  04/11/2016  . TRANSESOPHAGEAL ECHOCARDIOGRAM  03/09/2016   [facility]: EF 55-60%, PFO seen with bi-directional shunting, no thrombus in appendage.  . TRANSTHORACIC ECHOCARDIOGRAM  01/2015; 01/2016; 05/18/16   01/2015 No signif change in aortic stenosis (moderate).  01/2016 Severe LVH w/small LV cavity, EF 60-65%, grade I diast dysfxn.  05/2016 (s/p TAVR): EF 50-55%, grd I DD, biopros AV good.  [name] VITRECTOMY     Past Medical History:  Diagnosis Date  . ASTHMA   . BPH (benign prostatic hypertrophy)   . CAD (coronary artery disease)    Nonobstructive by 12/2012 cath; then 03/2016 he required BMS to RCA ([facility]).  In-stent restenosis on cath 11/01/16, baloon angioplasty successful--pt to take plavix (no ASA), + eliquis (for PAF).  [name] CATARACT, HX OF   . Chronic combined systolic and diastolic CHF (congestive heart failure) ([place]) 05/2016   Mild; [facility]  . COPD   . Elevated transaminase level 02/2016   Suspect due to fatty liver (documented on u/s 2007). Plan repeat labs 03/2016.  .Marland KitchenEssential hypertension, benign   . Fatty liver 2007     u/s showed fatty liver with hepatosplenomegaly  . GERD (gastroesophageal reflux disease)   . Glaucoma   . GOUT   . Hallux valgus (acquired)   . HH (hiatus hernia)   . HYPERCHOLESTEROLEMIA-PURE   . Hypogonadism male   . IFG (impaired fasting glucose)    HbA1c jumped from 5.7% to 6.1% 03/2015---started metformin at that time.  . Lumbosacral neuritis   . Lumbosacral spondylosis    Lumbar spinal stenosis with neurogenic claudication--contributes to his chronic pain syndrome  . Normal memory function 08/2014   Neuropsychological testing ([facility]): no cognitive impairment or sign of neurodegenerative disorder.  Likely has adjustment d/o with mixed anxiety/depressed features and may benefit from low dose SNRI.    [name] Normocytic anemia 03/2016   Mild-pt needs ferritin and vit B12 level checked (as of 03/22/16)  . NSTEMI (non-ST elevated myocardial infarction) ([place]) 03/20/2016   BMS to RCA  . Obesity hypoventilation syndrome ([place])   . Obesity, unspecified   . Orthostatic hypotension   . OSA on CPAP    8 cm H2O  . OSTEOARTHRITIS   . Osteoarthrosis, unspecified whether generalized or localized, unspecified site   . Paroxysmal atrial fibrillation ([place]) 2003    (? chronic?) Off anticoag for a while due to falls.  Then apixaban started 12/2014.  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 Ella Bodo)  . Personal history of colonic adenoma 10/30/2012   Diminutive adenoma, consider repeat 2019 per GI  . PUD (peptic ulcer disease)   . PULMONARY HYPERTENSION, HX OF   . Secondary male hypogonadism 2017  . 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 Shortness of breath    with exertion: much improved s/p TAVR and treatment for CHF.  [name] Unspecified glaucoma(365.9)   . Unspecified hereditary and idiopathic peripheral neuropathy approx age 69   bilat LE's, ? left arm, too.  Feet became  progressively numb + left foot pain intermittently.  Pt may be trying a spinal stimulator (as of 05/2015)  . VENOUS INSUFFICIENCY    Being followed by Dr. Sharol Given as of 10/2016 for two R LL venous stasis ulcers/skin tears.  Healed as of 10/30/16 f/u with Dr. Sharol Given.  . VENTRAL HERNIA    There were no vitals taken for this visit.  Opioid Risk Score:   Fall Risk Score:  `1  Depression screen PHQ 2/9  Depression screen [facility] 2/9 05/22/2017 02/06/2017 01/25/2017 09/23/2015 07/15/2015 03/16/2015 02/16/2015  Decreased Interest 0 0 0 0 0 0 0  Down, Depressed, Hopeless 0 0 0 0 0 0 0  PHQ - 2 Score 0 0 0 0 0 0 0  Altered sleeping - - - - - - -  Tired, decreased energy - - - - - - -  Change in appetite - - - - - - -  Feeling bad or failure about yourself  - - - - - - -  Trouble concentrating - - - - - - -  Moving slowly or fidgety/restless - - - - - - -  Suicidal thoughts - - - - - - -  PHQ-9 Score - - - - - - -  Some recent data might be hidden     Review of Systems  Constitutional: Negative.   HENT: Negative.   Eyes: Negative.   Respiratory: Negative.   Cardiovascular: Negative.   Gastrointestinal: Negative.   Endocrine: Negative.   Genitourinary: Negative.   Musculoskeletal: Negative.   Skin: Negative.   Allergic/Immunologic: Negative.   Neurological: Negative.   Hematological: Negative.   Psychiatric/Behavioral: Negative.        Objective:   Physical Exam  Constitutional: He is oriented to person, place, and time. He appears well-developed and well-nourished. No distress.  HENT:  Head: Normocephalic and atraumatic.  Eyes: Conjunctivae and EOM are normal. Pupils are equal, round, and reactive to light.  Neck: Normal range of motion.  Musculoskeletal:  No pain with knee or ankle range of motion He has limited range of motion of the lumbar spine with  flexion extension lateral bending and rotation Ambulates with a cane short step length wide base support.  Neurological: He is alert and  oriented to person, place, and time.  Motor strength is 5/5 bilateral hip flexor knee extensor  Skin: Skin is warm and dry. He is not diaphoretic.  Psychiatric: Judgment and thought content normal. His affect is blunt. His speech is delayed. He is slowed. Cognition and memory are normal.  Nursing note and vitals reviewed.  Extremities demonstrate stasis dermatitis bilateral pretibial area minimal pretibial edema       Assessment & Plan:  1.  Lumbar spinal stenosis he has chronic radicular symptoms. He has chronic low back pain Continue gabapentin 600 mg 4 times daily Continue oxycodone 10 mg 3 times daily, if he is able to consistently use 10 mg 3 times daily I would reduce his next prescription to 90 tablets/month.  He will follow-up with nurse practitioner next month. He will follow-up with Dr. Maryjean Ka regarding spinal cord stimulation

## 2017-05-27 ENCOUNTER — Encounter: Payer: Self-pay | Admitting: Family Medicine

## 2017-06-01 ENCOUNTER — Other Ambulatory Visit: Payer: Self-pay | Admitting: Family Medicine

## 2017-06-18 ENCOUNTER — Other Ambulatory Visit: Payer: Self-pay | Admitting: Family Medicine

## 2017-06-22 ENCOUNTER — Encounter: Payer: Medicare Other | Attending: Registered Nurse | Admitting: Registered Nurse

## 2017-06-22 ENCOUNTER — Encounter: Payer: Self-pay | Admitting: Registered Nurse

## 2017-06-22 VITALS — BP 119/76 | HR 74

## 2017-06-22 DIAGNOSIS — M48062 Spinal stenosis, lumbar region with neurogenic claudication: Secondary | ICD-10-CM

## 2017-06-22 DIAGNOSIS — J449 Chronic obstructive pulmonary disease, unspecified: Secondary | ICD-10-CM | POA: Diagnosis not present

## 2017-06-22 DIAGNOSIS — G894 Chronic pain syndrome: Secondary | ICD-10-CM

## 2017-06-22 DIAGNOSIS — M109 Gout, unspecified: Secondary | ICD-10-CM | POA: Diagnosis not present

## 2017-06-22 DIAGNOSIS — E7800 Pure hypercholesterolemia, unspecified: Secondary | ICD-10-CM | POA: Diagnosis not present

## 2017-06-22 DIAGNOSIS — G609 Hereditary and idiopathic neuropathy, unspecified: Secondary | ICD-10-CM | POA: Diagnosis not present

## 2017-06-22 DIAGNOSIS — M1990 Unspecified osteoarthritis, unspecified site: Secondary | ICD-10-CM | POA: Insufficient documentation

## 2017-06-22 DIAGNOSIS — Z79899 Other long term (current) drug therapy: Secondary | ICD-10-CM

## 2017-06-22 DIAGNOSIS — I2510 Atherosclerotic heart disease of native coronary artery without angina pectoris: Secondary | ICD-10-CM | POA: Insufficient documentation

## 2017-06-22 DIAGNOSIS — K219 Gastro-esophageal reflux disease without esophagitis: Secondary | ICD-10-CM | POA: Insufficient documentation

## 2017-06-22 DIAGNOSIS — Z5181 Encounter for therapeutic drug level monitoring: Secondary | ICD-10-CM

## 2017-06-22 DIAGNOSIS — M79672 Pain in left foot: Secondary | ICD-10-CM | POA: Diagnosis present

## 2017-06-22 MED ORDER — OXYCODONE HCL 10 MG PO TABS: 10.0000 mg | ORAL_TABLET | Freq: Three times a day (TID) | ORAL | 0 refills | Status: DC | PRN

## 2017-06-22 NOTE — Patient Instructions (Signed)
Increase Gabapentin to 5 times a day and call office in a week, to evaluate medication changes.  [phone]

## 2017-06-22 NOTE — Progress Notes (Signed)
Subjective:    Patient ID: Tim [name], male    DOB: 04-Apr-1941, 76 y.o.   MRN: [id number]  HPI:  Tim [name] is a 76year old male who returns for follow upappointmentfor chronic pain and medication refill. He states his pain is located in his lower back and left footHe rates his pain 4. His current exercise regime is walking and using the stationary bike for 30 minutes QOD.   Tim [name] reports his pain increases in intensity  during the night, we will increase his gabapentin. He was instructed to call office in a week to evaluate medication changes, he verbalizes understanding.   Tim [name] Morphine equivalent is 45.45  MME.     Oral Swab was performed on 04/26/2017 it was consistent.   Pain Inventory Average Pain 8 Pain Right Now 4 My pain is intermittent, sharp, burning, dull, stabbing, tingling and aching  In the last 24 hours, has pain interfered with the following? General activity 8 Relation with others 3 Enjoyment of life 5 What TIME of day is your pain at its worst? night Sleep (in general) Poor  Pain is worse with: walking, bending, standing and some activites Pain improves with: rest, pacing activities, medication, TENS and injections Relief from Meds: 6  Mobility walk with assistance use a cane how many minutes can you walk? 5 ability to climb steps?  no do you drive?  yes  Function retired I need assistance with the following:  household duties and shopping  Neuro/Psych weakness numbness tremor tingling trouble walking  Prior Studies Any changes since last visit?  no  Physicians involved in your care Any changes since last visit?  no   Family History  Problem Relation Age of Onset  . Hypertension Mother   . Coronary artery disease Mother   . Heart attack Mother   . Neuropathy Mother   . Pulmonary fibrosis Father        asbestosis   Social History   Socioeconomic History  . Marital status: Married    Spouse name: Not on  file  . Number of children: 0  . Years of education: 51  . Highest education level: Not on file  Social Needs  . Financial resource strain: Not on file  . Food insecurity - worry: Not on file  . Food insecurity - inability: Not on file  . Transportation needs - medical: Not on file  . Transportation needs - non-medical: Not on file  Occupational History  . Occupation: Engineer, production    Comment: retired  Tobacco Use  . Smoking status: Never Smoker  . Smokeless tobacco: Never Used  Substance and Sexual Activity  . Alcohol use: No    Alcohol/week: 0.0 oz  . Drug use: No  . Sexual activity: Not Currently  Other Topics Concern  . Not on file  Social History Narrative   HSG, [name] - BS, [name] - MS-engineering, 2 years on PhD - [place]. Married - '65 - 30yr/divorced; '76- 3 yrs/divorced; '92 . No children. 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 ACP/Living Will - Yes CPR; long-term Mechanical ventilation as long as he was able to cognate; ok for long term artificial nutrition. Precondition being able to cognate and not to have too much pain.    Past Surgical History:  Procedure Laterality Date  . AMPUTATION Left 04/11/2013   Procedure: AMPUTATION DIGIT Left 3rd toe;  Surgeon: [name] MD;  [place] [facility]  Service:  Orthopedics;  Laterality: Left;  Left 3rd toe amputation at [facility]  . CARDIAC CATHETERIZATION  1997; 03/10/16   1997 Non-obstructive disease.  03/2016 BMS to RCA, with 25% pDiag dz, o/w normal cors per cath 03/07/16.  Cath 11/01/16: in stent restenosis, successful baloon angioplasty.  [name] CARDIAC CATHETERIZATION  12/24/2012   mild < 20% LCx, prox 30% RCA; LVEF 55-65% , moderate pulmonary HTN, moderate AS  . CARDIOVASCULAR STRESS TEST  05/11/16 ([facility])   Myocardial perfusion imaging:  No ischemia; scar in apex, global hypokinesis, EF 36%.  . Carotid dopplers  03/09/2016   [facility]: no hemodynamically significant stenosis on either side.  .  CHOLECYSTECTOMY    . COLONOSCOPY    . COLONOSCOPY [name] 10/30/2012   Procedure: COLONOSCOPY;  Surgeon: [name], MD;  Location: [facility];  Service: Endoscopy;  Laterality: [name];  . CORONARY ANGIOPLASTY WITH STENT PLACEMENT  03/2016; 04/2017   2017-[facility]: BMS to RCA-pt was placed on Brilinta.  04/2017: DES to RCA.  [name] EYE SURGERY Bilateral cataract  . HEMORRHOID SURGERY    . INTRAOCULAR LENS INSERTION Bilateral   . KNEE SURGERY Right   . LEFT AND RIGHT HEART CATHETERIZATION WITH CORONARY ANGIOGRAM [name] 12/24/2012   Procedure: LEFT AND RIGHT HEART CATHETERIZATION WITH CORONARY ANGIOGRAM;  Surgeon: [name], MD;  Location: [facility] CATH LAB;  Service: Cardiovascular;  Laterality: [name];  . LEG SURGERY Bilateral    lenghtening   . PACEMAKER PLACEMENT  04/13/2016   2nd deg HB after TAVR, pt had DC MDT PPM placed.  [name] SHOULDER ARTHROSCOPY  08/30/2011   Procedure: ARTHROSCOPY SHOULDER;  Surgeon: [name], MD;  Location: [place];  Service: Orthopedics;  Laterality: Right;  Right Shoulder Arthroscopy, Debridement, and Decompression  . SPINAL CORD STIMULATOR INSERTION [name] 09/10/2015   Procedure: LUMBAR SPINAL CORD STIMULATOR INSERTION;  Surgeon: [name], MD;  Location: [place] NEURO ORS;  Service: Neurosurgery;  Laterality: [name];  . 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  R big toe surg due to osteoarth  . TONSILLECTOMY    . traeculectomy Left    eye  . TRANSCATHETER AORTIC VALVE REPLACEMENT, TRANSFEMORAL  04/11/2016  . TRANSESOPHAGEAL ECHOCARDIOGRAM  03/09/2016   [facility]: EF 55-60%, PFO seen with bi-directional shunting, no thrombus in appendage.  . TRANSTHORACIC ECHOCARDIOGRAM  01/2015; 01/2016; 05/18/16   01/2015 No signif change in aortic stenosis (moderate).  01/2016 Severe LVH w/small LV cavity, EF 60-65%, grade I diast dysfxn.  05/2016 (s/p TAVR): EF 50-55%, grd I DD, biopros AV good.  [name] VITRECTOMY     Past Medical History:  Diagnosis Date  . ASTHMA   . BPH (benign prostatic  hypertrophy)   . CAD (coronary artery disease)    Nonobstructive by 12/2012 cath; then 03/2016 he required BMS to RCA ([facility]).  In-stent restenosis on cath 11/01/16, baloon angioplasty successful--pt to take plavix (no ASA), + eliquis (for PAF).  [name] CATARACT, HX OF   . Chronic combined systolic and diastolic CHF (congestive heart failure) ([place]) 05/2016   Mild; [facility]  . COPD   . Elevated transaminase level 02/2016   Suspect due to fatty liver (documented on u/s 2007). Plan repeat labs 03/2016.  .Marland KitchenEssential hypertension, benign   . Fatty liver 2007   u/s showed fatty liver with hepatosplenomegaly  . GERD (gastroesophageal reflux disease)   . Glaucoma   . GOUT   . Hallux valgus (acquired)   . HH (hiatus hernia)   .  HYPERCHOLESTEROLEMIA-PURE   . Hypogonadism male   . IFG (impaired fasting glucose)    HbA1c jumped from 5.7% to 6.1% 03/2015---started metformin at that time.  . Lumbosacral neuritis   . Lumbosacral spondylosis    Lumbar spinal stenosis with neurogenic claudication--contributes to his chronic pain syndrome  . Normal memory function 08/2014   Neuropsychological testing ([facility]): no cognitive impairment or sign of neurodegenerative disorder.  Likely has adjustment d/o with mixed anxiety/depressed features and may benefit from low dose SNRI.    [name] Normocytic anemia 03/2016   Mild-pt needs ferritin and vit B12 level checked (as of 03/22/16)  . NSTEMI (non-ST elevated myocardial infarction) ([place]) 03/20/2016   BMS to RCA  . Obesity hypoventilation syndrome ([place])   . Obesity, unspecified   . Orthostatic hypotension   . OSA on CPAP    8 cm H2O  . OSTEOARTHRITIS   . Osteoarthrosis, unspecified whether generalized or localized, unspecified site   . Paroxysmal atrial fibrillation ([place]) 2003    (? chronic?) Off anticoag for a while due to falls.  Then apixaban started 12/2014.  [name] Peripheral neuropathy    DPN (+[facility];  with chronic neuropathic pain--Dr. Ella Bodo)  . Personal history of colonic adenoma 10/30/2012   Diminutive adenoma, consider repeat 2019 per GI  . PUD (peptic ulcer disease)   . PULMONARY HYPERTENSION, HX OF   . Secondary male hypogonadism 2017  . 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 Shortness of breath    with exertion: much improved s/p TAVR and treatment for CHF.  [name] Unspecified glaucoma(365.9)   . Unspecified hereditary and idiopathic peripheral neuropathy approx age 16   bilat LE's, ? left arm, too.  Feet became progressively numb + left foot pain intermittently.  Pt may be trying a spinal stimulator (as of 05/2015)  . VENOUS INSUFFICIENCY    Being followed by Dr. Sharol Given as of 10/2016 for two R LL venous stasis ulcers/skin tears.  Healed as of 10/30/16 f/u with Dr. Sharol Given.  . VENTRAL HERNIA    There were no vitals taken for this visit.  Opioid Risk Score:   Fall Risk Score:  `1  Depression screen PHQ 2/9  Depression screen [facility] 2/9 05/22/2017 02/06/2017 01/25/2017 09/23/2015 07/15/2015 03/16/2015 02/16/2015  Decreased Interest 0 0 0 0 0 0 0  Down, Depressed, Hopeless 0 0 0 0 0 0 0  PHQ - 2 Score 0 0 0 0 0 0 0  Altered sleeping - - - - - - -  Tired, decreased energy - - - - - - -  Change in appetite - - - - - - -  Feeling bad or failure about yourself  - - - - - - -  Trouble concentrating - - - - - - -  Moving slowly or fidgety/restless - - - - - - -  Suicidal thoughts - - - - - - -  PHQ-9 Score - - - - - - -  Some recent data might be hidden     Review of Systems  HENT: Negative.   Eyes: Negative.   Respiratory: Positive for apnea and shortness of breath.   Cardiovascular: Positive for leg swelling.  Endocrine: Negative.        High blood sugar  Genitourinary: Negative.   Musculoskeletal: Positive for arthralgias, back pain, gait problem and joint swelling.  Skin: Negative.   Allergic/Immunologic: Negative.  Neurological: Positive for tremors, weakness and numbness.       Tingling  Hematological: Bruises/bleeds easily.  Psychiatric/Behavioral: Negative.   All other systems reviewed and are negative.      Objective:   Physical Exam  Constitutional: He is oriented to person, place, and time. He appears well-developed and well-nourished.  HENT:  Head: Normocephalic and atraumatic.  Neck: Normal range of motion. Neck supple.  Cardiovascular: Normal rate and regular rhythm.  Pulmonary/Chest: Effort normal and breath sounds normal.  Musculoskeletal:  Normal Muscle Bulk and Muscle Testing Reveals:  Upper Extremities: Right: Decreased ROM 45 Degrees and Muscle Strength 4/5 Left:Full  ROM and Muscle Strength 5/5 Lumbar Paraspinal Tenderness: L-3-L-5 Lower Extremities: Full ROM and Muscle Strength 5/5 Arises from chair slowly using straight cane for support Narrow Based gait    Neurological: He is alert and oriented to person, place, and time.  Skin: Skin is warm and dry.  Psychiatric: He has a normal mood and affect.  Nursing note and vitals reviewed.         Assessment & Plan:  1. Diabetic neuropathy with chronic neuropathic pain: Using Tens Unit: Continue to Monitor. Increase Gabapentin, call office in a week to evaluate medication change. Continue with weaning of Tramadol and encouraged to keep pain journal. 06/22/2017 Continue:Oxycodone 10 mg one tablet every 8 hours as needed , may take an extra tablet when pain is severe #100.  We will continue the opioid monitoring program, this consists of regular clinic visits, examinations, urine drug screen, pill counts as well as use of [place] Controlled Substance Reporting System. 2. DDD lumbar spine with LBP: Continue to increase activity as tolerated and heat therapy. 06/22/2017 S/P Permanent Spinal Stimulator Placed 09/10/15 via Dr. Maryjean Ka.  20 minutes of face to face patient care time was spent during this visit. All  questions were encouraged and answered.  F/U in 1 month

## 2017-06-24 ENCOUNTER — Encounter: Payer: Self-pay | Admitting: Family Medicine

## 2017-07-13 ENCOUNTER — Encounter: Payer: Self-pay | Admitting: Family Medicine

## 2017-07-16 ENCOUNTER — Other Ambulatory Visit: Payer: Self-pay | Admitting: Physical Medicine & Rehabilitation

## 2017-07-17 ENCOUNTER — Encounter: Payer: Self-pay | Admitting: Family Medicine

## 2017-07-18 ENCOUNTER — Encounter: Payer: Self-pay | Admitting: Registered Nurse

## 2017-07-18 ENCOUNTER — Encounter: Payer: Medicare Other | Attending: Registered Nurse | Admitting: Registered Nurse

## 2017-07-18 ENCOUNTER — Other Ambulatory Visit: Payer: Self-pay

## 2017-07-18 VITALS — BP 135/78 | HR 72

## 2017-07-18 DIAGNOSIS — M25561 Pain in right knee: Secondary | ICD-10-CM

## 2017-07-18 DIAGNOSIS — M109 Gout, unspecified: Secondary | ICD-10-CM | POA: Diagnosis not present

## 2017-07-18 DIAGNOSIS — E7800 Pure hypercholesterolemia, unspecified: Secondary | ICD-10-CM | POA: Insufficient documentation

## 2017-07-18 DIAGNOSIS — J449 Chronic obstructive pulmonary disease, unspecified: Secondary | ICD-10-CM | POA: Insufficient documentation

## 2017-07-18 DIAGNOSIS — G894 Chronic pain syndrome: Secondary | ICD-10-CM

## 2017-07-18 DIAGNOSIS — G8929 Other chronic pain: Secondary | ICD-10-CM | POA: Diagnosis not present

## 2017-07-18 DIAGNOSIS — Z79899 Other long term (current) drug therapy: Secondary | ICD-10-CM

## 2017-07-18 DIAGNOSIS — K219 Gastro-esophageal reflux disease without esophagitis: Secondary | ICD-10-CM | POA: Insufficient documentation

## 2017-07-18 DIAGNOSIS — M79672 Pain in left foot: Secondary | ICD-10-CM | POA: Diagnosis present

## 2017-07-18 DIAGNOSIS — M48062 Spinal stenosis, lumbar region with neurogenic claudication: Secondary | ICD-10-CM | POA: Diagnosis not present

## 2017-07-18 DIAGNOSIS — M1990 Unspecified osteoarthritis, unspecified site: Secondary | ICD-10-CM | POA: Diagnosis not present

## 2017-07-18 DIAGNOSIS — Z5181 Encounter for therapeutic drug level monitoring: Secondary | ICD-10-CM | POA: Diagnosis not present

## 2017-07-18 DIAGNOSIS — G609 Hereditary and idiopathic neuropathy, unspecified: Secondary | ICD-10-CM | POA: Diagnosis not present

## 2017-07-18 DIAGNOSIS — M25562 Pain in left knee: Secondary | ICD-10-CM | POA: Diagnosis not present

## 2017-07-18 DIAGNOSIS — I2510 Atherosclerotic heart disease of native coronary artery without angina pectoris: Secondary | ICD-10-CM | POA: Insufficient documentation

## 2017-07-18 MED ORDER — OXYCODONE HCL 10 MG PO TABS: 10.0000 mg | ORAL_TABLET | Freq: Three times a day (TID) | ORAL | 0 refills | Status: DC | PRN

## 2017-07-18 MED ORDER — GABAPENTIN 800 MG PO TABS: 800.0000 mg | ORAL_TABLET | Freq: Four times a day (QID) | ORAL | 0 refills | Status: DC

## 2017-07-18 MED ORDER — GABAPENTIN 600 MG PO TABS: 600.0000 mg | ORAL_TABLET | Freq: Four times a day (QID) | ORAL | 2 refills | Status: DC

## 2017-07-18 NOTE — Progress Notes (Signed)
Subjective:    Patient ID: Tim [name], male    DOB: [birth year], 77 y.o.   MRN: [id number]  HPI:  [name] is a 77year old male who returns for follow upappointmentfor chronic pain and medication refill. He states his pain is located in his lower back, bilateral knees L>R with activity and left foot pain. [name] reports increase intensity of nerve pain in left foot, he increased his gabapentin to 5 times with no relief. We will prescribe gabapentin 800 mg QID, he verbalizes understanding.He rates his pain 4. His current exercise regime is walking, also states he is not using the stationary bike due to increase intensity of knee pain he reports.    [name] Morphine equivalent is 57.54  MME.     Oral Swab was performed on 04/26/2017 it was consistent.   Pain Inventory Average Pain 7 Pain Right Now 4 My pain is intermittent, sharp, burning, dull, stabbing, tingling and aching  In the last 24 hours, has pain interfered with the following? General activity 7 Relation with others 9 Enjoyment of life 4 What TIME of day is your pain at its worst? night Sleep (in general) Poor  Pain is worse with: walking, bending, standing and some activites Pain improves with: rest, pacing activities, medication, TENS and injections Relief from Meds: 6  Mobility walk with assistance use a cane how many minutes can you walk? [name] ability to climb steps?  no do you drive?  yes  Function retired I need assistance with the following:  household duties and shopping  Neuro/Psych weakness numbness tremor tingling spasms  Prior Studies Any changes since last visit?  no  Physicians involved in your care Any changes since last visit?  no   Family History  Problem Relation Age of Onset  . Hypertension Mother   . Coronary artery disease Mother   . Heart attack Mother   . Neuropathy Mother   . Pulmonary fibrosis Father        asbestosis   Social History    Socioeconomic History  . Marital status: Married    Spouse name: None  . Number of children: 0  . Years of education: 21  . Highest education level: None  Social Needs  . Financial resource strain: None  . Food insecurity - worry: None  . Food insecurity - inability: None  . Transportation needs - medical: None  . Transportation needs - non-medical: None  Occupational History  . Occupation: Engineer, production    Comment: retired  Tobacco Use  . Smoking status: Never Smoker  . Smokeless tobacco: Never Used  Substance and Sexual Activity  . Alcohol use: No    Alcohol/week: 0.0 oz  . Drug use: No  . Sexual activity: Not Currently  Other Topics Concern  . None  Social History Narrative   HSG, [name] - BS, [name] - MS-engineering, 2 years on PhD - [name]. Married - '65 - 34yr/divorced; '76- 3 yrs/divorced; '92 . No children. 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 ACP/Living Will - Yes CPR; long-term Mechanical ventilation as long as he was able to cognate; ok for long term artificial nutrition. Precondition being able to cognate and not to have too much pain.    Past Surgical History:  Procedure Laterality Date  . AMPUTATION Left 04/11/2013   Procedure: AMPUTATION DIGIT Left 3rd toe;  Surgeon: [name] MD;  Location: [facility]  Service: Orthopedics;  Laterality: Left;  Left 3rd toe amputation at [facility]  . CARDIAC CATHETERIZATION  1997; 03/10/16   1997 Non-obstructive disease.  03/2016 BMS to RCA, with 25% pDiag dz, o/w normal cors per cath 03/07/16.  Cath 11/01/16: in stent restenosis, successful baloon angioplasty.  [name] CARDIAC CATHETERIZATION  12/24/2012   mild < 20% LCx, prox 30% RCA; LVEF 55-65% , moderate pulmonary HTN, moderate AS  . CARDIOVASCULAR STRESS TEST  05/11/16 ([facility])   Myocardial perfusion imaging:  No ischemia; scar in apex, global hypokinesis, EF 36%.  . Carotid dopplers  03/09/2016   [facility]: no hemodynamically significant stenosis on  either side.  . CHOLECYSTECTOMY    . COLONOSCOPY    . COLONOSCOPY [name] 10/30/2012   Procedure: COLONOSCOPY;  Surgeon: [name], MD;  Location: [facility];  Service: Endoscopy;  Laterality: [name];  . CORONARY ANGIOPLASTY WITH STENT PLACEMENT  03/2016; 04/2017   2017-[facility]: BMS to RCA-pt was placed on Brilinta.  04/2017: DES to RCA.  [name] EYE SURGERY Bilateral cataract  . HEMORRHOID SURGERY    . INTRAOCULAR LENS INSERTION Bilateral   . KNEE SURGERY Right   . LEFT AND RIGHT HEART CATHETERIZATION WITH CORONARY ANGIOGRAM [name] 12/24/2012   Procedure: LEFT AND RIGHT HEART CATHETERIZATION WITH CORONARY ANGIOGRAM;  Surgeon: [name], MD;  Location: [facility];  Service: Cardiovascular;  Laterality: [name];  . LEG SURGERY Bilateral    lenghtening   . PACEMAKER PLACEMENT  04/13/2016   2nd deg HB after TAVR, pt had DC MDT PPM placed.  [name] SHOULDER ARTHROSCOPY  08/30/2011   Procedure: ARTHROSCOPY SHOULDER;  Surgeon: [name], MD;  Location: [facility];  Service: Orthopedics;  Laterality: Right;  Right Shoulder Arthroscopy, Debridement, and Decompression  . SPINAL CORD STIMULATOR INSERTION [name] 09/10/2015   Procedure: LUMBAR SPINAL CORD STIMULATOR INSERTION;  Surgeon: [name], MD;  Location: [facility];  Service: Neurosurgery;  Laterality: [name];  . 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  R big toe surg due to osteoarth  . TONSILLECTOMY    . traeculectomy Left    eye  . TRANSCATHETER AORTIC VALVE REPLACEMENT, TRANSFEMORAL  04/11/2016  . TRANSESOPHAGEAL ECHOCARDIOGRAM  03/09/2016   [facility]: EF 55-60%, PFO seen with bi-directional shunting, no thrombus in appendage.  . TRANSTHORACIC ECHOCARDIOGRAM  01/2015; 01/2016; 05/18/16; 09/18/16   01/2015 No signif change in aortic stenosis (moderate).  01/2016 Severe LVH w/small LV cavity, EF 60-65%, grade I diast dysfxn.  05/2016 (s/p TAVR): EF 50-55%, grd I DD, biopros AV good.  08/2016--EF 50-55%, LV septal motion c/w conduction abnl, grd I DD,mild  MS,bioprosth aortic valve well seated, w/trace aortic regurg  . VITRECTOMY     Past Medical History:  Diagnosis Date  . ASTHMA   . BPH (benign prostatic hypertrophy)   . CAD (coronary artery disease)    Nonobstructive by 12/2012 cath; then 03/2016 he required BMS to RCA ([facility]).  In-stent restenosis on cath 11/01/16, baloon angioplasty successful--pt to take plavix (no ASA), + eliquis (for PAF).  [name] CATARACT, HX OF   . Chronic combined systolic and diastolic CHF (congestive heart failure) ([place]) 05/2016   Mild; [facility] 68[facility]  . Chronic pain syndrome    Lumbar DDD; chronic neuropathic pain (DM); has spinal stimulator and sees pain mgmt MD  . COPD   . Diabetes mellitus type 2 with complications (HCC)    [id number] from 5.7% to 6.1% 03/2015---started metformin at that time.  DM 2 dx by fasting gluc  criteria 2018.  Has chronic neuropath pain  . Elevated transaminase level 02/2016   Suspect due to fatty liver (documented on u/s 2007). Plan repeat labs 03/2016.  [name] Essential hypertension, benign   . Fatty liver 2007   u/s showed fatty liver with hepatosplenomegaly  . GERD (gastroesophageal reflux disease)   . Glaucoma   . GOUT   . Hallux valgus (acquired)   . HH (hiatus hernia)   . HYPERCHOLESTEROLEMIA-PURE   . Hypogonadism male   . Lumbosacral neuritis   . Lumbosacral spondylosis    Lumbar spinal stenosis with neurogenic claudication--contributes to his chronic pain syndrome  . Normal memory function 08/2014   Neuropsychological testing ([facility]): no cognitive impairment or sign of neurodegenerative disorder.  Likely has adjustment d/o with mixed anxiety/depressed features and may benefit from low dose SNRI.    [name] Normocytic anemia 03/2016   Mild-pt needs ferritin and vit B12 level checked (as of 03/22/16)  . NSTEMI (non-ST elevated myocardial infarction) ([place]) 03/20/2016   BMS to RCA  . Obesity hypoventilation syndrome ([place])   .  Obesity, unspecified   . Orthostatic hypotension   . OSA on CPAP    8 cm H2O  . OSTEOARTHRITIS   . Osteoarthrosis, unspecified whether generalized or localized, unspecified site   . Paroxysmal atrial fibrillation ([place]) 2003    (? chronic?) Off anticoag for a while due to falls.  Then apixaban started 12/2014.  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 Ella Bodo)  . Personal history of colonic adenoma 10/30/2012   Diminutive adenoma, consider repeat 2019 per GI  . PUD (peptic ulcer disease)   . PULMONARY HYPERTENSION, HX OF   . Secondary male hypogonadism 2017  . 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 Shortness of breath    with exertion: much improved s/p TAVR and treatment for CHF.  [name] Unspecified glaucoma(365.9)   . Unspecified hereditary and idiopathic peripheral neuropathy approx age 77   bilat LE's, ? left arm, too.  Feet became progressively numb + left foot pain intermittently.  Pt may be trying a spinal stimulator (as of 05/2015)  . VENOUS INSUFFICIENCY    Being followed by Dr. Sharol Given as of 10/2016 for two R LL venous stasis ulcers/skin tears.  Healed as of 10/30/16 f/u with Dr. Sharol Given.  . VENTRAL HERNIA    BP 135/78   Pulse 72   SpO2 97%   Opioid Risk Score:  1 Fall Risk Score:  `1  Depression screen PHQ 2/9  Depression screen [facility] 2/9 07/18/2017 05/22/2017 02/06/2017 01/25/2017 09/23/2015 07/15/2015 03/16/2015  Decreased Interest 0 0 0 0 0 0 0  Down, Depressed, Hopeless 0 0 0 0 0 0 0  PHQ - 2 Score 0 0 0 0 0 0 0  Altered sleeping - - - - - - -  Tired, decreased energy - - - - - - -  Change in appetite - - - - - - -  Feeling bad or failure about yourself  - - - - - - -  Trouble concentrating - - - - - - -  Moving slowly or fidgety/restless - - - - - - -  Suicidal thoughts - - - - - - -  PHQ-9 Score - - - - - - -  Some recent data might be hidden     Review of Systems    Constitutional: Positive  for diaphoresis.  HENT: Negative.   Eyes: Negative.   Respiratory: Positive for apnea and shortness of breath.   Cardiovascular: Positive for leg swelling.  Gastrointestinal: Positive for constipation.  Endocrine: Negative.        High blood sugar  Genitourinary: Negative.   Musculoskeletal: Positive for arthralgias, back pain, gait problem and joint swelling.  Skin: Positive for rash.  Allergic/Immunologic: Negative.   Neurological: Positive for tremors, weakness and numbness.       Tingling  Hematological: Bruises/bleeds easily.  Psychiatric/Behavioral: Negative.   All other systems reviewed and are negative.      Objective:   Physical Exam  Constitutional: He is oriented to person, place, and time. He appears well-developed and well-nourished.  HENT:  Head: Normocephalic and atraumatic.  Neck: Normal range of motion. Neck supple.  Cardiovascular: Normal rate and regular rhythm.  Pulmonary/Chest: Effort normal and breath sounds normal.  Musculoskeletal:  Normal Muscle Bulk and Muscle testing reveals: Upper Extremities: Right: Decreased ROM 45 Degrees and Muscle Strength 4/5  Left: Full ROM and Muscle Strength 5/5 Thoracic Paraspinal Tenderness: T-7-T-9 Lumbar Paraspinal Tenderness: L-3-L-5 Lower Extremities: Full ROM and Muscle Strength 5/5 Left Lower Extremity Flexion Produces Pain into Patella Arises from chair slowly using straight cane for support Antalgic gait    Neurological: He is alert and oriented to person, place, and time.  Skin: Skin is warm and dry.  Psychiatric: He has a normal mood and affect.  Nursing note and vitals reviewed.         Assessment & Plan:  1. Diabetic neuropathy with chronic neuropathic pain: Using Tens Unit: Increase Gabapentin 800 mg QID. 07/18/2017 Continue:Oxycodone 10 mg one tablet every 8 hours as needed , may take an extra tablet when pain is severe #100.  We will continue the opioid monitoring  program, this consists of regular clinic visits, examinations, urine drug screen, pill counts as well as use of [place] Controlled Substance Reporting System. 2. DDD lumbar spine with LBP: Continue to increase activity as tolerated and heat therapy. 07/18/2017 S/P Permanent Spinal Stimulator Placed 09/10/15 via Dr. Maryjean Ka. 3. Bilateral Chronic  Knee Pain: Continue to Monitor. 07/18/2017.  20 minutes of face to face patient care time was spent during this visit. All questions were encouraged and answered.  F/U in 1 month

## 2017-07-19 ENCOUNTER — Ambulatory Visit: Payer: Medicare Other | Admitting: Registered Nurse

## 2017-08-08 ENCOUNTER — Other Ambulatory Visit: Payer: Self-pay | Admitting: Family Medicine

## 2017-08-09 ENCOUNTER — Ambulatory Visit (INDEPENDENT_AMBULATORY_CARE_PROVIDER_SITE_OTHER): Payer: Medicare Other | Admitting: Family Medicine

## 2017-08-09 ENCOUNTER — Encounter: Payer: Self-pay | Admitting: Family Medicine

## 2017-08-09 ENCOUNTER — Other Ambulatory Visit: Payer: Self-pay | Admitting: Family Medicine

## 2017-08-09 VITALS — BP 92/56 | HR 70 | Temp 98.1°F | Resp 16 | Ht 71.5 in | Wt 323.8 lb

## 2017-08-09 DIAGNOSIS — L989 Disorder of the skin and subcutaneous tissue, unspecified: Secondary | ICD-10-CM | POA: Diagnosis not present

## 2017-08-09 DIAGNOSIS — I10 Essential (primary) hypertension: Secondary | ICD-10-CM

## 2017-08-09 DIAGNOSIS — E118 Type 2 diabetes mellitus with unspecified complications: Secondary | ICD-10-CM

## 2017-08-09 DIAGNOSIS — E7800 Pure hypercholesterolemia, unspecified: Secondary | ICD-10-CM | POA: Diagnosis not present

## 2017-08-09 DIAGNOSIS — D696 Thrombocytopenia, unspecified: Secondary | ICD-10-CM

## 2017-08-09 DIAGNOSIS — Z0001 Encounter for general adult medical examination with abnormal findings: Secondary | ICD-10-CM

## 2017-08-09 DIAGNOSIS — D649 Anemia, unspecified: Secondary | ICD-10-CM | POA: Diagnosis not present

## 2017-08-09 DIAGNOSIS — I5022 Chronic systolic (congestive) heart failure: Secondary | ICD-10-CM

## 2017-08-09 DIAGNOSIS — Z0000 Encounter for general adult medical examination without abnormal findings: Secondary | ICD-10-CM

## 2017-08-09 LAB — CBC WITH DIFFERENTIAL/PLATELET
Basophils Absolute: 0 10*3/uL (ref 0.0–0.1)
Basophils Relative: 0.7 % (ref 0.0–3.0)
Eosinophils Absolute: 0.1 10*3/uL (ref 0.0–0.7)
Eosinophils Relative: 2 % (ref 0.0–5.0)
HCT: 36.5 % — ABNORMAL LOW (ref 39.0–52.0)
HEMOGLOBIN: 12.4 g/dL — AB (ref 13.0–17.0)
LYMPHS ABS: 2 10*3/uL (ref 0.7–4.0)
Lymphocytes Relative: 32.6 % (ref 12.0–46.0)
MCHC: 33.9 g/dL (ref 30.0–36.0)
MCV: 91.4 fl (ref 78.0–100.0)
MONO ABS: 0.8 10*3/uL (ref 0.1–1.0)
Monocytes Relative: 13.3 % — ABNORMAL HIGH (ref 3.0–12.0)
NEUTROS PCT: 51.4 % (ref 43.0–77.0)
Neutro Abs: 3.1 10*3/uL (ref 1.4–7.7)
Platelets: 116 10*3/uL — ABNORMAL LOW (ref 150.0–400.0)
RBC: 3.99 Mil/uL — AB (ref 4.22–5.81)
RDW: 15.1 % (ref 11.5–15.5)
WBC: 6 10*3/uL (ref 4.0–10.5)

## 2017-08-09 LAB — LIPID PANEL
CHOLESTEROL: 108 mg/dL (ref 0–200)
HDL: 56.3 mg/dL (ref >39.00)
LDL CALC: 39 mg/dL (ref 0–99)
NonHDL: 51.36
Total CHOL/HDL Ratio: 2
Triglycerides: 63 mg/dL (ref 0.0–149.0)
VLDL: 12.6 mg/dL (ref 0.0–40.0)

## 2017-08-09 LAB — COMPREHENSIVE METABOLIC PANEL
ALBUMIN: 3.8 g/dL (ref 3.5–5.2)
ALK PHOS: 57 U/L (ref 39–117)
ALT: 23 U/L (ref 0–53)
AST: 23 U/L (ref 0–37)
BUN: 23 mg/dL (ref 6–23)
CALCIUM: 8.9 mg/dL (ref 8.4–10.5)
CHLORIDE: 102 meq/L (ref 96–112)
CO2: 29 mEq/L (ref 19–32)
CREATININE: 0.82 mg/dL (ref 0.40–1.50)
GFR: 96.65 mL/min (ref >60.00)
Glucose, Bld: 133 mg/dL — ABNORMAL HIGH (ref 70–99)
POTASSIUM: 4.2 meq/L (ref 3.5–5.1)
Sodium: 139 mEq/L (ref 135–145)
TOTAL PROTEIN: 6.2 g/dL (ref 6.0–8.3)
Total Bilirubin: 0.6 mg/dL (ref 0.2–1.2)

## 2017-08-09 LAB — HEMOGLOBIN A1C: HEMOGLOBIN A1C: 6.1 % (ref 4.6–6.5)

## 2017-08-09 NOTE — Patient Instructions (Signed)

## 2017-08-09 NOTE — Progress Notes (Signed)
Office Note 08/09/2017  CC:  Chief Complaint  Patient presents with  . Annual Exam    Pt is fasting.     HPI:  Tim [name] is a 77 y.o. White male who is here for annual health maintenance exam. Having some lightheadedness and a bit of fatigue from not drinking ANY fluids since last night---b/c fasting for labs today.  He did not know it was ok to drink water.  He has continued to take all meds as usual.  Pain/neuropathy update: improved pain control of late, gets regular f/u with phys med and rehab provider.  Cardiology update: reviewed most recent f/u note in EMR (07/19/17). NYHA class III CHF, Nov 2018 TTE showed EF 35%---down. He was taken off lisinopril and put on entresto-- started on 07/22/17.  Plan for f/u echo March 2019. No CP in last few months.  CAD stable.  S/p TAVR--stable.  Pulm update: good 07/2017, spirometry improved.  Has skin c/o: 6 mo hx of round pink lesion about the size of a penny, a bit of superficial crustiness. Every now and then itches a lot, but most of the time doesn't. Clotrimazole/betamethasone applications has not helped x 2 weeks. No tenderness/pain at the area.  Has a couple of similar, smaller skin lesions on upper chest and R leg.  Past Medical History:  Diagnosis Date  . ASTHMA   . BPH (benign prostatic hypertrophy)   . CAD (coronary artery disease)    Nonobstructive by 12/2012 cath; then 03/2016 he required BMS to RCA ([facility]).  In-stent restenosis on cath 11/01/16, baloon angioplasty successful--pt to take plavix (no ASA), + eliquis (for PAF).  [name] CATARACT, HX OF   . Chronic combined systolic and diastolic CHF (congestive heart failure) ([place]) 05/2016   Mild; [facility]  . Chronic pain syndrome    Lumbar DDD; chronic neuropathic pain (DM); has spinal stimulator and sees pain mgmt MD  . COPD   . Diabetes mellitus type 2 with complications (HCC)    [id number] from 5.7% to 6.1% 03/2015---started  metformin at that time.  DM 2 dx by fasting gluc criteria 2018.  Has chronic neuropath pain  . Elevated transaminase level 02/2016   Suspect due to fatty liver (documented on u/s 2007). Plan repeat labs 03/2016.  .Marland KitchenEssential hypertension, benign   . Fatty liver 2007   u/s showed fatty liver with hepatosplenomegaly  . GERD (gastroesophageal reflux disease)   . Glaucoma   . GOUT   . Hallux valgus (acquired)   . HH (hiatus hernia)   . HYPERCHOLESTEROLEMIA-PURE   . Hypogonadism male   . Lumbosacral neuritis   . Lumbosacral spondylosis    Lumbar spinal stenosis with neurogenic claudication--contributes to his chronic pain syndrome  . Normal memory function 08/2014   Neuropsychological testing ([facility]): no cognitive impairment or sign of neurodegenerative disorder.  Likely has adjustment d/o with mixed anxiety/depressed features and may benefit from low dose SNRI.    .Marland KitchenNormocytic anemia 03/2016   Mild-pt needs ferritin and vit B12 level checked (as of 03/22/16)  . NSTEMI (non-ST elevated myocardial infarction) ([place] 03/20/2016   BMS to RCA  . Obesity hypoventilation syndrome ([facility]   . Obesity, unspecified   . Orthostatic hypotension   . OSA on CPAP    8 cm H2O  . OSTEOARTHRITIS   . Osteoarthrosis, unspecified whether generalized or localized, unspecified site   . Paroxysmal atrial fibrillation ([place]    (? chronic?)  Off anticoag for a while due to falls.  Then apixaban started 12/2014.  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 Ella Bodo)  . Personal history of colonic adenoma 10/30/2012   Diminutive adenoma, consider repeat 2019 per GI  . PUD (peptic ulcer disease)   . PULMONARY HYPERTENSION, HX OF   . Secondary male hypogonadism 2017  . 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 Shortness of breath    with exertion: much improved s/p TAVR and treatment for CHF.  [name]  Unspecified glaucoma(365.9)   . Unspecified hereditary and idiopathic peripheral neuropathy approx age 86   bilat LE's, ? left arm, too.  Feet became progressively numb + left foot pain intermittently.  Pt may be trying a spinal stimulator (as of 05/2015)  . VENOUS INSUFFICIENCY    Being followed by Dr. Sharol Given as of 10/2016 for two R LL venous stasis ulcers/skin tears.  Healed as of 10/30/16 f/u with Dr. Sharol Given.  . VENTRAL HERNIA     Past Surgical History:  Procedure Laterality Date  . AMPUTATION Left 04/11/2013   Procedure: AMPUTATION DIGIT Left 3rd toe;  Surgeon: [name], MD;  Location: [facility];  Service: Orthopedics;  Laterality: Left;  Left 3rd toe amputation at [facility]  . CARDIAC CATHETERIZATION  1997; 03/10/16   1997 Non-obstructive disease.  03/2016 BMS to RCA, with 25% pDiag dz, o/w normal cors per cath 03/07/16.  Cath 11/01/16: in stent restenosis, successful baloon angioplasty.  [name] CARDIAC CATHETERIZATION  12/24/2012   mild < 20% LCx, prox 30% RCA; LVEF 55-65% , moderate pulmonary HTN, moderate AS  . CARDIOVASCULAR STRESS TEST  05/11/16 ([facility])   Myocardial perfusion imaging:  No ischemia; scar in apex, global hypokinesis, EF 36%.  . Carotid dopplers  03/09/2016   [facility]: no hemodynamically significant stenosis on either side.  . CHOLECYSTECTOMY    . COLONOSCOPY    . COLONOSCOPY [name] 10/30/2012   Procedure: COLONOSCOPY;  Surgeon: [name], MD;  Location: [facility];  Service: Endoscopy;  Laterality: [name];  . CORONARY ANGIOPLASTY WITH STENT PLACEMENT  03/2016; 04/2017   2017-[facility]: BMS to RCA-pt was placed on Brilinta.  04/2017: DES to RCA.  [name] EYE SURGERY Bilateral cataract  . HEMORRHOID SURGERY    . INTRAOCULAR LENS INSERTION Bilateral   . KNEE SURGERY Right   . LEFT AND RIGHT HEART CATHETERIZATION WITH CORONARY ANGIOGRAM [name] 12/24/2012   Procedure: LEFT AND RIGHT HEART CATHETERIZATION WITH CORONARY ANGIOGRAM;  Surgeon: [name], MD;  [facility];  Service:  Cardiovascular;  Laterality: [name];  . LEG SURGERY Bilateral    lenghtening   . PACEMAKER PLACEMENT  04/13/2016   2nd deg HB after TAVR, pt had DC MDT PPM placed.  [name] SHOULDER ARTHROSCOPY  08/30/2011   Procedure: ARTHROSCOPY SHOULDER;  Surgeon: [name], MD;  Location: [facility];  Service: Orthopedics;  Laterality: Right;  Right Shoulder Arthroscopy, Debridement, and Decompression  . SPINAL CORD STIMULATOR INSERTION [name] 09/10/2015   Procedure: LUMBAR SPINAL CORD STIMULATOR INSERTION;  Surgeon: [name], MD;  Location: [facility];  Service: Neurosurgery;  Laterality: [name];  . 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  R big toe surg due to osteoarth  . TONSILLECTOMY    . traeculectomy Left    eye  . TRANSCATHETER AORTIC VALVE REPLACEMENT, TRANSFEMORAL  04/11/2016  . TRANSESOPHAGEAL ECHOCARDIOGRAM  03/09/2016  [facility]: EF 55-60%, PFO seen with bi-directional shunting, no thrombus in appendage.  . TRANSTHORACIC ECHOCARDIOGRAM  01/2015; 01/2016; 05/18/16; 09/18/16, 05/2017   01/2015 No signif change in aortic stenosis (moderate).  01/2016 Severe LVH w/small LV cavity, EF 60-65%, grade I diast dysfxn.  05/2016 (s/p TAVR): EF 50-55%, grd I DD, biopros AV good.  08/2016--EF 50-55%, LV septal motion c/w conduction abnl, grd I DD,mild MS,bioprosth aortic valve well seated, w/trace aortic regurg. 05/2017 TTE EF 35%.  [name] VITRECTOMY      Family History  Problem Relation Age of Onset  . Hypertension Mother   . Coronary artery disease Mother   . Heart attack Mother   . Neuropathy Mother   . Pulmonary fibrosis Father        asbestosis    Social History   Socioeconomic History  . Marital status: Married    Spouse name: Not on file  . Number of children: 0  . Years of education: 54  . Highest education level: Not on file  Social Needs  . Financial resource strain: Not on file  . Food insecurity - worry: Not on file  . Food insecurity - inability: Not on file  . Transportation needs - medical:  Not on file  . Transportation needs - non-medical: Not on file  Occupational History  . Occupation: Engineer, production    Comment: retired  Tobacco Use  . Smoking status: Never Smoker  . Smokeless tobacco: Never Used  Substance and Sexual Activity  . Alcohol use: No    Alcohol/week: 0.0 oz  . Drug use: No  . Sexual activity: Not Currently  Other Topics Concern  . 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 Married - '65 - 25yr/divorced; '76- 3 yrs/divorced; '92 . No children. 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 ACP/Living Will - Yes CPR; long-term Mechanical ventilation as long as he was able to cognate; ok for long term artificial nutrition. Precondition being able to cognate and not to have too much pain.     Outpatient Medications Prior to Visit  Medication Sig Dispense Refill  . albuterol (PROVENTIL HFA;VENTOLIN HFA) 108 (90 BASE) MCG/ACT inhaler Inhale 2 puffs into the lungs 4 (four) times daily as needed for wheezing or shortness of breath.    . allopurinol (ZYLOPRIM) 300 MG tablet take 1 tablet by mouth once daily with food 30 tablet 11  . apixaban (ELIQUIS) 5 MG TABS tablet Take 1 tablet (5 mg total) by mouth 2 (two) times daily. 60 tablet 0  . B Complex-C (B-COMPLEX WITH VITAMIN C) tablet Take 1 tablet by mouth daily.    . cadexomer iodine (IODOSORB) 0.9 % gel Apply 1 application topically daily as needed for wound care. 10 g 3  . clotrimazole-betamethasone (LOTRISONE) cream apply to affected area twice a day 45 g 5  . ezetimibe (ZETIA) 10 MG tablet take 1 tablet by mouth once daily 90 tablet 3  . finasteride (PROSCAR) 5 MG tablet TAKE 1 TABLET BY MOUTH ONCE DAILY 90 tablet 1  . fluticasone (FLONASE) 50 MCG/ACT nasal spray Place 2 sprays into both nostrils as needed for allergies or rhinitis.    . Fluticasone-Salmeterol (ADVAIR) 250-50 MCG/DOSE AEPB Inhale 1 puff into the lungs  daily as needed (for nasal congestion).     . gabapentin (NEURONTIN) 800 MG tablet Take 1 tablet (800 mg total) by mouth 4 (four) times  daily. 120 tablet 0  . metFORMIN (GLUCOPHAGE) 500 MG tablet TAKE 1 TABLET BY MOUTH TWICE DAILY WITH FOOD 60 tablet 5  . metoprolol succinate (TOPROL-XL) 25 MG 24 hr tablet TAKE 1 TABLET BY MOUTH DAILY. 90 tablet 0  . metoprolol succinate (TOPROL-XL) 50 MG 24 hr tablet take 1 tablet by mouth once daily ALONG WITH 25 MG TO EQUAL 75MG 90 tablet 1  . Multiple Vitamin (MULTIVITAMIN) capsule Take 1 capsule by mouth daily.    . mupirocin ointment (BACTROBAN) 2 % Apply 1 application topically 2 (two) times daily. Apply to the affected area 2 times a day 22 g 3  . niacin (NIASPAN) 1000 MG CR tablet TAKE 1 TABLET BY MOUTH TWICE DAILY 180 tablet 1  . omeprazole (PRILOSEC) 10 MG capsule take 1 capsule by mouth once daily 30 capsule 11  . Oxycodone HCl 10 MG TABS Take 1 tablet (10 mg total) by mouth every 8 (eight) hours as needed. May take an extra tablet when pain is severe 100 tablet 0  . potassium chloride SA (K-DUR,KLOR-CON) 20 MEQ tablet TAKE 1 TABLET BY MOUTH ONCE DAILY 90 tablet 0  . rosuvastatin (CRESTOR) 40 MG tablet take 1 tablet by mouth once daily 15 tablet 0  . sacubitril-valsartan (ENTRESTO) 24-26 MG Take 1 tablet by mouth 2 (two) times daily.    [name] torsemide (DEMADEX) 20 MG tablet TAKE 1 TABLET BY MOUTH ONCE DAILY 30 tablet 5  . traMADol-acetaminophen (ULTRACET) 37.5-325 MG tablet Take 1 tablet by mouth 3 (three) times daily as needed. 90 tablet 3  . VITAMIN D, CHOLECALCIFEROL, PO Take 1 tablet by mouth daily.     . isosorbide mononitrate (IMDUR) 60 MG 24 hr tablet take 1 tablet by mouth once daily 30 tablet 6  . Niacin CR 1000 MG TBCR Take 1 tablet (1,000 mg total) by mouth 2 (two) times daily. (Patient not taking: Reported on 08/09/2017) 180 each 3   No facility-administered medications prior to visit.     Allergies  Allergen Reactions  . Brimonidine  Tartrate Shortness Of Breath    Alphagan-Shortness of breath  . Brimonidine Tartrate Palpitations  . Brinzolamide Shortness Of Breath    AZOPT- Shortness of breath  . Latanoprost Shortness Of Breath    XALATAN- Shortness of breath  . Nucynta [Tapentadol] Shortness Of Breath  . Sulfa Antibiotics Palpitations  . Timolol Maleate Shortness Of Breath and Other (See Comments)    TIMOPTIC- Aggravated asthma  . Cardizem  [Diltiazem Hcl] Swelling  . Diltiazem Swelling     leg swelling  . Rofecoxib Swelling     VIOXX- leg swelling  . Vancomycin Hives and Other (See Comments)    Possible "Red Man Syndrome"? > hives/blisters  . Codeine Other (See Comments)    Childhood reaction  . Celecoxib Other (See Comments)    CELLBREX-confusion  . Colchicine Diarrhea    diarrhea  . Tape Rash    ROS Review of Systems  Constitutional: Negative for appetite change, chills, fatigue and fever.  HENT: Negative for congestion, dental problem, ear pain and sore throat.   Eyes: Negative for discharge, redness and visual disturbance.  Respiratory: Positive for shortness of breath (chronic, with exertion). Negative for cough, chest tightness and wheezing.   Cardiovascular: Negative for chest pain, palpitations and leg swelling.  Gastrointestinal: Negative for abdominal pain, blood in stool, diarrhea, nausea and vomiting.  Genitourinary: Negative for difficulty urinating, dysuria, flank pain, frequency, hematuria and urgency.  Musculoskeletal: Positive for back pain (chronic  LBP) and myalgias (both knees-"I'm gonna have to get a recheck with my orthopedist"). Negative for arthralgias, joint swelling and neck stiffness.  Skin: Negative for pallor and rash.  Neurological: Positive for light-headedness (intermittent today). Negative for dizziness, speech difficulty, weakness and headaches.  Hematological: Negative for adenopathy. Does not bruise/bleed easily.  Psychiatric/Behavioral: Negative for confusion and  sleep disturbance. The patient is not nervous/anxious.     PE; Blood pressure (!) 92/56, pulse 70, temperature 98.1 F (36.7 C), temperature source Oral, resp. rate 16, height 5' 11.5" (1.816 m), weight (!) 323 lb 12 oz (146.9 kg), SpO2 96 %. Body mass index is 44.52 kg/m.  Gen: Alert, well appearing, morbidly obese WM in NAD.  Patient is oriented to person, place, time, and situation.   Has significant difficulty getting onto exam table.   AFFECT: pleasant, lucid thought and speech. ENT: Ears: EACs clear, normal epithelium.  TMs with good light reflex and landmarks bilaterally.  Eyes: no injection, icteris, swelling, or exudate.  EOMI, PERRLA. Nose: no drainage or turbinate edema/swelling.  No injection or focal lesion.  Mouth: lips without lesion/swelling.  Oral mucosa pink and moist.  Dentition intact and without obvious caries or gingival swelling.  Oropharynx without erythema, exudate, or swelling.  Neck: supple/nontender.  No LAD, mass, or TM.  Carotid pulses 2+ bilaterally, without bruits. CV: RRR (S1 and S2 distant), soft systolic murmur, no r/g.   LUNGS: CTA bilat, nonlabored resps, good aeration in all lung fields. ABD: soft, NT, ND, BS normal.  No hepatospenomegaly or mass.  No bruits. EXT: no clubbing or cyanosis.  Trace to 1+ pitting edema bilat LLs. Musculoskeletal: no joint swelling, erythema, warmth, or tenderness.  Skin - no pallor or jaundice. Left wrist medial aspect with pink, round, macule with well demarcated borders, slightly irregular surface/superficial desquamation.  +Blanchable with pressure. Remainder of skin not examined due to pt's difficulty getting into a gown and moving in general, + acute back spasms while in exam room.   Pertinent labs:  Lab Results  Component Value Date   TSH 1.67 02/09/2016   Lab Results  Component Value Date   WBC 5.1 04/28/2017   HGB 11.1 (A) 04/28/2017   HCT 34 (A) 04/28/2017   MCV 89.1 08/25/2016   PLT 89 (A) 04/28/2017    Lab Results  Component Value Date   IRON 59 05/24/2016   IRON 54 05/24/2016   TIBC 334 05/24/2016   FERRITIN 44.8 05/24/2016   Lab Results  Component Value Date   CREATININE 0.7 04/28/2017   BUN 17 04/28/2017   NA 145 04/28/2017   K 4.2 04/28/2017   CL 103 02/09/2017   CO2 34 (H) 02/09/2017   Lab Results  Component Value Date   ALT 22 04/28/2017   AST 24 04/28/2017   ALKPHOS 64 04/28/2017   BILITOT 0.6 03/20/2016   Lab Results  Component Value Date   CHOL 122 02/09/2017   Lab Results  Component Value Date   HDL 55.30 02/09/2017   Lab Results  Component Value Date   LDLCALC 38 02/09/2017   Lab Results  Component Value Date   TRIG 142.0 02/09/2017   Lab Results  Component Value Date   CHOLHDL 2 02/09/2017   Lab Results  Component Value Date   PSA 0.23 11/23/2015   PSA 0.33 10/07/2014   PSA 0.40 09/25/2013   Lab Results  Component Value Date   HGBA1C 6.0 02/09/2017    ASSESSMENT AND PLAN:   1) Health  maintenance exam: Reviewed age and gender appropriate health maintenance issues (prudent diet, regular exercise, health risks of tobacco and excessive alcohol, use of seatbelts, fire alarms in home, use of sunscreen).  Also reviewed age and gender appropriate health screening as well as vaccine recommendations. Vaccines: Flu--UTD (04/02/18)    Shingrix--rx printed for pt today. Labs: CMET, FLP, A1c, CBC. Prostate ca screening: n/a anymore due to pt age. Colon ca screening: next colonoscopy due 2024, if pt chooses to pursue this.   2) Chronic systolic HF, NYHA class III sx's: cardiologist recently switched him from lisinopril to entresto. He'll f/u with cards around 08/19/17 and get BMET checked. F/u echo planned 08/2017.   Per cardiologist's note, if he continues to have left ventricular dysfunction then he will require upgrade to a Biventricular pacemaker and ICD.  3) Skin lesions: ? Tinea corporis (also looks somewhat like a fixed drug eruption--but the  only recent med started was entresto, and these skin lesions were present well before starting entresto).   Recommended he d/c clotrimazole-betamethasone cream and switch to otc lamisil cream bid. If not improved in two weeks at office recheck, consider derm referral vs biopsy (he is on eliquis and plavix).  An After Visit Summary was printed and given to the patient.   FOLLOW UP:  Return in about 3 months (around 11/06/2017) for routine chronic illness f/u (fasting not necessary).  Signed:  Crissie Sickles, MD           08/09/2017

## 2017-08-10 ENCOUNTER — Encounter: Payer: Self-pay | Admitting: Family Medicine

## 2017-08-14 ENCOUNTER — Other Ambulatory Visit: Payer: Self-pay | Admitting: Family Medicine

## 2017-08-14 ENCOUNTER — Other Ambulatory Visit: Payer: Self-pay | Admitting: Registered Nurse

## 2017-08-14 DIAGNOSIS — K760 Fatty (change of) liver, not elsewhere classified: Secondary | ICD-10-CM

## 2017-08-14 DIAGNOSIS — R162 Hepatomegaly with splenomegaly, not elsewhere classified: Secondary | ICD-10-CM

## 2017-08-20 ENCOUNTER — Encounter: Payer: Medicare Other | Attending: Registered Nurse | Admitting: Registered Nurse

## 2017-08-20 ENCOUNTER — Encounter: Payer: Self-pay | Admitting: Registered Nurse

## 2017-08-20 VITALS — BP 133/76 | HR 90

## 2017-08-20 DIAGNOSIS — I2510 Atherosclerotic heart disease of native coronary artery without angina pectoris: Secondary | ICD-10-CM | POA: Diagnosis not present

## 2017-08-20 DIAGNOSIS — M48062 Spinal stenosis, lumbar region with neurogenic claudication: Secondary | ICD-10-CM | POA: Diagnosis not present

## 2017-08-20 DIAGNOSIS — G609 Hereditary and idiopathic neuropathy, unspecified: Secondary | ICD-10-CM | POA: Diagnosis not present

## 2017-08-20 DIAGNOSIS — Z5181 Encounter for therapeutic drug level monitoring: Secondary | ICD-10-CM | POA: Diagnosis not present

## 2017-08-20 DIAGNOSIS — E7800 Pure hypercholesterolemia, unspecified: Secondary | ICD-10-CM | POA: Insufficient documentation

## 2017-08-20 DIAGNOSIS — M109 Gout, unspecified: Secondary | ICD-10-CM | POA: Diagnosis not present

## 2017-08-20 DIAGNOSIS — M1990 Unspecified osteoarthritis, unspecified site: Secondary | ICD-10-CM | POA: Insufficient documentation

## 2017-08-20 DIAGNOSIS — K219 Gastro-esophageal reflux disease without esophagitis: Secondary | ICD-10-CM | POA: Insufficient documentation

## 2017-08-20 DIAGNOSIS — Z79899 Other long term (current) drug therapy: Secondary | ICD-10-CM | POA: Diagnosis not present

## 2017-08-20 DIAGNOSIS — J449 Chronic obstructive pulmonary disease, unspecified: Secondary | ICD-10-CM | POA: Insufficient documentation

## 2017-08-20 DIAGNOSIS — G894 Chronic pain syndrome: Secondary | ICD-10-CM | POA: Diagnosis not present

## 2017-08-20 DIAGNOSIS — M79672 Pain in left foot: Secondary | ICD-10-CM | POA: Diagnosis present

## 2017-08-20 MED ORDER — OXYCODONE HCL 10 MG PO TABS: 10.0000 mg | ORAL_TABLET | Freq: Three times a day (TID) | ORAL | 0 refills | Status: DC | PRN

## 2017-08-20 MED ORDER — TRAMADOL-ACETAMINOPHEN 37.5-325 MG PO TABS: 1.0000 | ORAL_TABLET | Freq: Three times a day (TID) | ORAL | 0 refills | Status: DC | PRN

## 2017-08-20 MED ORDER — GABAPENTIN 800 MG PO TABS: 800.0000 mg | ORAL_TABLET | Freq: Four times a day (QID) | ORAL | 0 refills | Status: DC

## 2017-08-20 NOTE — Progress Notes (Signed)
Subjective:    Patient ID: Tim [name], male    DOB: 15-Aug-1940, 77 y.o.   MRN: [id number]  HPI:  Tim [name] is a 77year old male who returns for follow upappointmentfor chronic pain and medication refill. He states his pain is located in his lower back, bilateral knees L>R and left foot. Also reports the increase in gabapentin has helped his neuropathic pain. He rates his pain 6. His current exercise regime is walking. He hasn't been able to follow his usual exercise regime due to increase intensity of knee pain, also states he will be following up with his orthopedist.  Tim [name] Morphine equivalent is 55.08  MME.     Oral Swab was performed on 04/26/2017 it was consistent.   Pain Inventory Average Pain 4 Pain Right Now 6 My pain is sharp, burning, dull, stabbing, tingling and aching  In the last 24 hours, has pain interfered with the following? General activity 8 Relation with others 10 Enjoyment of life 3 What TIME of day is your pain at its worst? night Sleep (in general) Poor  Pain is worse with: walking, bending, standing and some activites Pain improves with: rest, pacing activities, medication, TENS and injections Relief from Meds: 7  Mobility walk with assistance use a cane how many minutes can you walk? 3 ability to climb steps?  no do you drive?  yes  Function retired I need assistance with the following:  meal prep, household duties and shopping  Neuro/Psych weakness numbness tremor tingling trouble walking spasms dizziness  Prior Studies Any changes since last visit?  no  Physicians involved in your care Any changes since last visit?  no   Family History  Problem Relation Age of Onset  . Hypertension Mother   . Coronary artery disease Mother   . Heart attack Mother   . Neuropathy Mother   . Pulmonary fibrosis Father        asbestosis   Social History   Socioeconomic History  . Marital status: Married    Spouse name:  None  . Number of children: 0  . Years of education: 78  . Highest education level: None  Social Needs  . Financial resource strain: None  . Food insecurity - worry: None  . Food insecurity - inability: None  . Transportation needs - medical: None  . Transportation needs - non-medical: None  Occupational History  . Occupation: Engineer, production    Comment: retired  Tobacco Use  . Smoking status: Never Smoker  . Smokeless tobacco: Never Used  Substance and Sexual Activity  . Alcohol use: No    Alcohol/week: 0.0 oz  . Drug use: No  . Sexual activity: Not Currently  Other Topics Concern  . None  Social History Narrative   HSG, [name] - BS, [name] - MS-engineering, 2 years on PhD - [name]. Married - '65 - 48yr/divorced; '76- 3 yrs/divorced; '92 . No children. 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 ACP/Living Will - Yes CPR; long-term Mechanical ventilation as long as he was able to cognate; ok for long term artificial nutrition. Precondition being able to cognate and not to have too much pain.    Past Surgical History:  Procedure Laterality Date  . AMPUTATION Left 04/11/2013   Procedure: AMPUTATION DIGIT Left 3rd toe;  Surgeon: [name] MD;  [place]  Service: Orthopedics;  Laterality: Left;  Left 3rd toe amputation at [facility]  . CARDIAC CATHETERIZATION  1997; 03/10/16   1997 Non-obstructive disease.  03/2016 BMS to RCA, with 25% pDiag dz, o/w normal cors per cath 03/07/16.  Cath 11/01/16: in stent restenosis, successful baloon angioplasty.  [name] CARDIAC CATHETERIZATION  12/24/2012   mild < 20% LCx, prox 30% RCA; LVEF 55-65% , moderate pulmonary HTN, moderate AS  . CARDIOVASCULAR STRESS TEST  05/11/16 ([facility])   Myocardial perfusion imaging:  No ischemia; scar in apex, global hypokinesis, EF 36%.  . Carotid dopplers  03/09/2016   [facility]: no hemodynamically significant stenosis on either side.  . CHOLECYSTECTOMY    . COLONOSCOPY    . COLONOSCOPY [name]  10/30/2012   Procedure: COLONOSCOPY;  Surgeon: [name], MD;  Location: [facility];  Service: Endoscopy;  Laterality: [name];  . CORONARY ANGIOPLASTY WITH STENT PLACEMENT  03/2016; 04/2017   2017-[facility]: BMS to RCA-pt was placed on Brilinta.  04/2017: DES to RCA.  [name] EYE SURGERY Bilateral cataract  . HEMORRHOID SURGERY    . INTRAOCULAR LENS INSERTION Bilateral   . KNEE SURGERY Right   . LEFT AND RIGHT HEART CATHETERIZATION WITH CORONARY ANGIOGRAM [name] 12/24/2012   Procedure: LEFT AND RIGHT HEART CATHETERIZATION WITH CORONARY ANGIOGRAM;  Surgeon: [name], MD;  Location: [facility] CATH LAB;  Service: Cardiovascular;  Laterality: [name];  . LEG SURGERY Bilateral    lenghtening   . PACEMAKER PLACEMENT  04/13/2016   2nd deg HB after TAVR, pt had DC MDT PPM placed.  [name] SHOULDER ARTHROSCOPY  08/30/2011   Procedure: ARTHROSCOPY SHOULDER;  Surgeon: [name], MD;  Location: [place];  Service: Orthopedics;  Laterality: Right;  Right Shoulder Arthroscopy, Debridement, and Decompression  . SPINAL CORD STIMULATOR INSERTION [name] 09/10/2015   Procedure: LUMBAR SPINAL CORD STIMULATOR INSERTION;  Surgeon: [name], MD;  Location: [place] [facility];  Service: Neurosurgery;  Laterality: [name];  . 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  R big toe surg due to osteoarth  . TONSILLECTOMY    . traeculectomy Left    eye  . TRANSCATHETER AORTIC VALVE REPLACEMENT, TRANSFEMORAL  04/11/2016  . TRANSESOPHAGEAL ECHOCARDIOGRAM  03/09/2016   [facility]: EF 55-60%, PFO seen with bi-directional shunting, no thrombus in appendage.  . TRANSTHORACIC ECHOCARDIOGRAM  01/2015; 01/2016; 05/18/16; 09/18/16, 05/2017   01/2015 No signif change in aortic stenosis (moderate).  01/2016 Severe LVH w/small LV cavity, EF 60-65%, grade I diast dysfxn.  05/2016 (s/p TAVR): EF 50-55%, grd I DD, biopros AV good.  08/2016--EF 50-55%, LV septal motion c/w conduction abnl, grd I DD,mild MS,bioprosth aortic valve well seated, w/trace aortic regurg. 05/2017  TTE EF 35%.  [name] VITRECTOMY     Past Medical History:  Diagnosis Date  . ASTHMA   . BPH (benign prostatic hypertrophy)   . CAD (coronary artery disease)    Nonobstructive by 12/2012 cath; then 03/2016 he required BMS to RCA ([facility]).  In-stent restenosis on cath 11/01/16, baloon angioplasty successful--pt to take plavix (no ASA), + eliquis (for PAF).  [name] CATARACT, HX OF   . Chronic combined systolic and diastolic CHF (congestive heart failure) ([place]) 05/2016   Mild; [facility]  . Chronic pain syndrome    Lumbar DDD; chronic neuropathic pain (DM); has spinal stimulator and sees pain mgmt MD  . COPD   . Diabetes mellitus type 2 with complications (HCC)    HBTD1Vjumped from 5.7% to 6.1% 03/2015---started metformin at that time.  DM 2 dx by fasting gluc criteria 2018.  Has chronic neuropath  pain  . Elevated transaminase level 02/2016   Suspect due to fatty liver (documented on u/s 2007). Plan repeat labs 03/2016.  [name] Essential hypertension, benign   . Fatty liver 2007   u/s showed fatty liver with hepatosplenomegaly  . GERD (gastroesophageal reflux disease)   . Glaucoma   . GOUT   . Hallux valgus (acquired)   . HH (hiatus hernia)   . HYPERCHOLESTEROLEMIA-PURE   . Hypogonadism male   . Lumbosacral neuritis   . Lumbosacral spondylosis    Lumbar spinal stenosis with neurogenic claudication--contributes to his chronic pain syndrome  . Normal memory function 08/2014   Neuropsychological testing ([facility]): no cognitive impairment or sign of neurodegenerative disorder.  Likely has adjustment d/o with mixed anxiety/depressed features and may benefit from low dose SNRI.    [name] Normocytic anemia 03/2016   Mild-pt needs ferritin and vit B12 level checked (as of 03/22/16)  . NSTEMI (non-ST elevated myocardial infarction) ([place]) 03/20/2016   BMS to RCA  . Obesity hypoventilation syndrome ([place])   . Obesity, unspecified   . Orthostatic hypotension     . OSA on CPAP    8 cm H2O  . OSTEOARTHRITIS   . Osteoarthrosis, unspecified whether generalized or localized, unspecified site   . Paroxysmal atrial fibrillation ([place]) 2003    (? chronic?) Off anticoag for a while due to falls.  Then apixaban started 12/2014.  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 Ella Bodo)  . Personal history of colonic adenoma 10/30/2012   Diminutive adenoma, consider repeat 2019 per GI  . PUD (peptic ulcer disease)   . PULMONARY HYPERTENSION, HX OF   . Secondary male hypogonadism 2017  . 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 Shortness of breath    with exertion: much improved s/p TAVR and treatment for CHF.  [name] Thrombocytopenia ([place]) 2018   HSM on 2007 abd u/s---suspect some mild splenic sequestration chronically.  [name] Unspecified glaucoma(365.9)   . Unspecified hereditary and idiopathic peripheral neuropathy approx age 45   bilat LE's, ? left arm, too.  Feet became progressively numb + left foot pain intermittently.  Pt may be trying a spinal stimulator (as of 05/2015)  . VENOUS INSUFFICIENCY    Being followed by Dr. Sharol Given as of 10/2016 for two R LL venous stasis ulcers/skin tears.  Healed as of 10/30/16 f/u with Dr. Sharol Given.  . VENTRAL HERNIA    BP 133/76   Pulse 90   SpO2 97%   Opioid Risk Score:  1 Fall Risk Score:  `1  Depression screen PHQ 2/9  Depression screen [facility] 2/9 08/20/2017 07/18/2017 05/22/2017 02/06/2017 01/25/2017 09/23/2015 07/15/2015  Decreased Interest 0 0 0 0 0 0 0  Down, Depressed, Hopeless 0 0 0 0 0 0 0  PHQ - 2 Score 0 0 0 0 0 0 0  Altered sleeping - - - - - - -  Tired, decreased energy - - - - - - -  Change in appetite - - - - - - -  Feeling bad or failure about yourself  - - - - - - -  Trouble concentrating - - - - - - -  Moving slowly or fidgety/restless - - - - - - -  Suicidal thoughts - - - - - - -  PHQ-9 Score - - - - - - -  Some  recent data might be  hidden     Review of Systems  Constitutional: Positive for diaphoresis.  HENT: Negative.   Eyes: Negative.   Respiratory: Positive for apnea and shortness of breath.   Cardiovascular: Positive for leg swelling.  Gastrointestinal: Positive for constipation.  Endocrine: Negative.        High blood sugar  Genitourinary: Negative.   Musculoskeletal: Positive for arthralgias, back pain, gait problem and joint swelling.  Skin: Positive for rash.  Allergic/Immunologic: Negative.   Neurological: Positive for tremors, weakness and numbness.       Tingling  Hematological: Bruises/bleeds easily.  Psychiatric/Behavioral: Negative.   All other systems reviewed and are negative.      Objective:   Physical Exam  Constitutional: He is oriented to person, place, and time. He appears well-developed and well-nourished.  HENT:  Head: Normocephalic and atraumatic.  Neck: Normal range of motion. Neck supple.  Cardiovascular: Normal rate and regular rhythm.  Pulmonary/Chest: Effort normal and breath sounds normal.  Musculoskeletal:  Normal Muscle Bulk and Muscle Testing reveals: Upper Extremities: Right: Decreased ROM 45 Degrees and Muscle Strength 4/5 Left: Full ROM and Muscle Strength 5/5 Thoracic Paraspinal Tenderness: T-1-T-3 T-7-T-9 Back without Paraspinal Tenderness Lower Extremities: Full l ROM and Muscle Strength 5/5 Arises from chair slowly using straight cane for support Narrow Based  Gait    Neurological: He is alert and oriented to person, place, and time.  Skin: Skin is warm and dry.  Psychiatric: He has a normal mood and affect.  Vitals reviewed.         Assessment & Plan:  1. Diabetic neuropathy with chronic neuropathic pain: Using Tens Unit: Continue  Gabapentin 800 mg QID. 08/20/2017 Refilled:Oxycodone 10 mg one tablet every 8 hours as needed , may take an extra tablet when pain is severe #100 and Tramadol 50 mg TID as needed.  We will continue  with the slow weaning of Tramadol, he verbalizes understanding, encouraged to continue with Pain Journal.  We will continue the opioid monitoring program, this consists of regular clinic visits, examinations, urine drug screen, pill counts as well as use of [place] Controlled Substance Reporting System. 2. DDD lumbar spine with LBP: Continue to increase activity as tolerated and heat therapy. 08/20/2017 S/P Permanent Spinal Stimulator Placed 09/10/15 via Dr. Maryjean Ka. 3. Bilateral Chronic  Knee Pain: Orthopedist Following.Continue to Monitor. 08/20/2017.  20 minutes of face to face patient care time was spent during this visit. All questions were encouraged and answered.  F/U in 1 month

## 2017-08-21 ENCOUNTER — Other Ambulatory Visit: Payer: Self-pay | Admitting: Cardiology

## 2017-08-21 DIAGNOSIS — I4891 Unspecified atrial fibrillation: Secondary | ICD-10-CM

## 2017-08-21 NOTE — Telephone Encounter (Signed)
Called patient and he states he sees Dr. Clovis Riley with [facility] and has notified his pharmacy of this. Called [facility] pharmacy and advised they change the following Rx(s) to this MD: crestor, metoprolol succinate, zetia, eliquis. Pharmacy staff can only change his metoprolol succinate to Dr. Radford Pax as this is the only medication needing to be refilled. They suggested that patient contact his new cardiologist and ask him to prescribe these medications.   Left message for patient that he should contact Dr. Radford Pax and request that he refill the stated Rx(s)

## 2017-08-22 ENCOUNTER — Encounter: Payer: Self-pay | Admitting: Family Medicine

## 2017-08-22 ENCOUNTER — Ambulatory Visit (INDEPENDENT_AMBULATORY_CARE_PROVIDER_SITE_OTHER): Payer: Medicare Other

## 2017-08-22 DIAGNOSIS — Z9049 Acquired absence of other specified parts of digestive tract: Secondary | ICD-10-CM

## 2017-08-22 DIAGNOSIS — K760 Fatty (change of) liver, not elsewhere classified: Secondary | ICD-10-CM

## 2017-08-22 DIAGNOSIS — R162 Hepatomegaly with splenomegaly, not elsewhere classified: Secondary | ICD-10-CM

## 2017-08-23 ENCOUNTER — Ambulatory Visit: Payer: Medicare Other | Admitting: Family Medicine

## 2017-08-29 ENCOUNTER — Ambulatory Visit: Payer: Medicare Other | Admitting: Family Medicine

## 2017-09-03 ENCOUNTER — Encounter: Payer: Self-pay | Admitting: Family Medicine

## 2017-09-04 ENCOUNTER — Ambulatory Visit: Payer: Medicare Other | Admitting: Family Medicine

## 2017-09-07 ENCOUNTER — Ambulatory Visit: Payer: Medicare Other | Admitting: Family Medicine

## 2017-09-07 ENCOUNTER — Encounter: Payer: Self-pay | Admitting: Family Medicine

## 2017-09-07 VITALS — BP 139/81 | HR 73 | Temp 97.7°F | Resp 16 | Ht 71.0 in | Wt 322.0 lb

## 2017-09-07 DIAGNOSIS — R21 Rash and other nonspecific skin eruption: Secondary | ICD-10-CM | POA: Diagnosis not present

## 2017-09-07 NOTE — Progress Notes (Signed)
OFFICE VISIT  09/07/2017   CC:  Chief Complaint  Patient presents with  . Abrasion    left forearm   HPI:    Patient is a 77 y.o. Caucasian male who presents for 1 mo f/u rash. Applying lamisil once daily for 1 mo now---L forearm and smaller leg lesions (x 2) have improved minimally.  Occ itch but no pain.  Past Medical History:  Diagnosis Date  . ASTHMA   . BPH (benign prostatic hypertrophy)   . CAD (coronary artery disease)    Nonobstructive by 12/2012 cath; then 03/2016 he required BMS to RCA ([facility]).  In-stent restenosis on cath 11/01/16, baloon angioplasty successful--pt to take plavix (no ASA), + eliquis (for PAF).  [name] CATARACT, HX OF   . Chronic combined systolic and diastolic CHF (congestive heart failure) ([place]) 05/2016   Mild; [facility]  . Chronic pain syndrome    Lumbar DDD; chronic neuropathic pain (DM); has spinal stimulator and sees pain mgmt MD  . COPD   . Diabetes mellitus type 2 with complications (HCC)    [id number] from 5.7% to 6.1% 03/2015---started metformin at that time.  DM 2 dx by fasting gluc criteria 2018.  Has chronic neuropath pain  . Elevated transaminase level 02/2016   Suspect due to fatty liver (documented on u/s 2007). Plan repeat labs 03/2016.  .Marland KitchenEssential hypertension, benign   . Fatty liver 2007   2007 u/s showed fatty liver with hepatosplenomegaly.  2019 repeat u/s->fatty liver but no cirrhosis or hepatosplenomegaly.  .Marland KitchenGERD (gastroesophageal reflux disease)   . Glaucoma   . GOUT   . Hallux valgus (acquired)   . HH (hiatus hernia)   . HYPERCHOLESTEROLEMIA-PURE   . Hypogonadism male   . Lumbosacral neuritis   . Lumbosacral spondylosis    Lumbar spinal stenosis with neurogenic claudication--contributes to his chronic pain syndrome  . Normal memory function 08/2014   Neuropsychological testing ([facility]): no cognitive impairment or sign of neurodegenerative disorder.  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    .Marland KitchenNormocytic anemia 03/2016   Mild-pt needs ferritin and vit B12 level checked (as of 03/22/16)  . NSTEMI (non-ST elevated myocardial infarction) ([place] 03/[place]/2016   BMS to RCA  . Obesity hypoventilation syndrome ([facility] [place]   . Obesity, unspecified   . Orthostatic hypotension   . OSA on CPAP    8 cm H2O  . OSTEOARTHRITIS   . Paroxysmal atrial fibrillation ([place] 2003    (? chronic?) Off anticoag for a while due to falls.  Then apixaban started 12/2014.  .Marland KitchenPeripheral neuropathy    DPN (+[facility]; with chronic neuropathic pain--Dr. KElla Bodo  . Personal history of colonic adenoma 10/30/2012   Diminutive adenoma, consider repeat 2019 per GI  . Primary osteoarthritis of both knees    Bone on bone of medial compartments, + signif patellofemoral arth bilat.  . PUD (peptic ulcer disease)   . PULMONARY HYPERTENSION, HX OF   . Secondary male hypogonadism 2017  . 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 Shortness of breath    with exertion: much improved s/p TAVR and treatment for CHF.  .Marland KitchenThrombocytopenia ([place] 2018   HSM on 2007 abd u/s---suspect some mild splenic sequestration chronically.  .Marland KitchenUnspecified glaucoma(365.9)   . Unspecified hereditary and idiopathic peripheral neuropathy approx age 77  bilat LE's, ? left arm, too.  Feet became progressively numb + left foot pain intermittently.  Pt may be trying a spinal stimulator (as of 05/2015)  . VENOUS INSUFFICIENCY    Being followed by Dr. Sharol Given as of 10/2016 for two R LL venous stasis ulcers/skin tears.  Healed as of 10/30/16 f/u with Dr. Sharol Given.  . VENTRAL HERNIA     Past Surgical History:  Procedure Laterality Date  . AMPUTATION Left 04/11/2013   Procedure: AMPUTATION DIGIT Left 3rd toe;  Surgeon: [name], MD;  Location: [facility];  Service: Orthopedics;  Laterality: Left;  Left 3rd toe  amputation at [facility]  . CARDIAC CATHETERIZATION  1997; 03/10/16   1997 Non-obstructive disease.  03/2016 BMS to RCA, with 25% pDiag dz, o/w normal cors per cath 03/07/16.  Cath 11/01/16: in stent restenosis, successful baloon angioplasty.  [name] CARDIAC CATHETERIZATION  12/24/2012   mild < 20% LCx, prox 30% RCA; LVEF 55-65% , moderate pulmonary HTN, moderate AS  . CARDIOVASCULAR STRESS TEST  05/11/16 ([facility])   Myocardial perfusion imaging:  No ischemia; scar in apex, global hypokinesis, EF 36%.  . Carotid dopplers  03/09/2016   [facility]: no hemodynamically significant stenosis on either side.  . CHOLECYSTECTOMY    . COLONOSCOPY    . COLONOSCOPY [name] 10/30/2012   Procedure: COLONOSCOPY;  Surgeon: [name], MD;  Location: [facility];  Service: Endoscopy;  Laterality: [name];  . CORONARY ANGIOPLASTY WITH STENT PLACEMENT  03/2016; 04/2017   2017-[facility]: BMS to RCA-pt was placed on Brilinta.  04/2017: DES to RCA.  [name] EYE SURGERY Bilateral cataract  . HEMORRHOID SURGERY    . INTRAOCULAR LENS INSERTION Bilateral   . KNEE SURGERY Right   . LEFT AND RIGHT HEART CATHETERIZATION WITH CORONARY ANGIOGRAM [name] 12/24/2012   Procedure: LEFT AND RIGHT HEART CATHETERIZATION WITH CORONARY ANGIOGRAM;  Surgeon: [name], MD;  Location: [name] CATH LAB;  Service: Cardiovascular;  Laterality: [name];  . LEG SURGERY Bilateral    lenghtening   . PACEMAKER PLACEMENT  04/13/2016   2nd deg HB after TAVR, pt had DC MDT PPM placed.  [name] SHOULDER ARTHROSCOPY  08/30/2011   Procedure: ARTHROSCOPY SHOULDER;  Surgeon: [name], MD;  Location: [facility];  Service: Orthopedics;  Laterality: Right;  Right Shoulder Arthroscopy, Debridement, and Decompression  . SPINAL CORD STIMULATOR INSERTION [name] 09/10/2015   Procedure: LUMBAR SPINAL CORD STIMULATOR INSERTION;  Surgeon: [name], MD;  Location: [facility];  Service: Neurosurgery;  Laterality: [name];  . 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  R big toe surg due to osteoarth  .  TONSILLECTOMY    . traeculectomy Left    eye  . TRANSCATHETER AORTIC VALVE REPLACEMENT, TRANSFEMORAL  04/11/2016  . TRANSESOPHAGEAL ECHOCARDIOGRAM  03/09/2016   [facility]: EF 55-60%, PFO seen with bi-directional shunting, no thrombus in appendage.  . TRANSTHORACIC ECHOCARDIOGRAM  01/2015; 01/2016; 05/18/16; 09/18/16, 05/2017   01/2015 No signif change in aortic stenosis (moderate).  01/2016 Severe LVH w/small LV cavity, EF 60-65%, grade I diast dysfxn.  05/2016 (s/p TAVR): EF 50-55%, grd I DD, biopros AV good.  08/2016--EF 50-55%, LV septal motion c/w conduction abnl, grd I DD,mild MS,bioprosth aortic valve well seated, w/trace aortic regurg. 05/2017 TTE EF 35%.  [name] VITRECTOMY      Outpatient Medications Prior to Visit  Medication Sig Dispense Refill  . albuterol (PROVENTIL HFA;VENTOLIN HFA) 108 (90 BASE) MCG/ACT inhaler Inhale 2 puffs into the lungs 4 (four) times daily as  needed for wheezing or shortness of breath.    . allopurinol (ZYLOPRIM) 300 MG tablet take 1 tablet by mouth once daily with food 30 tablet 11  . apixaban (ELIQUIS) 5 MG TABS tablet Take 1 tablet (5 mg total) by mouth 2 (two) times daily. 60 tablet 0  . B Complex-C (B-COMPLEX WITH VITAMIN C) tablet Take 1 tablet by mouth daily.    . cadexomer iodine (IODOSORB) 0.9 % gel Apply 1 application topically daily as needed for wound care. 10 g 3  . clotrimazole-betamethasone (LOTRISONE) cream apply to affected area twice a day 45 g 5  . ezetimibe (ZETIA) 10 MG tablet take 1 tablet by mouth once daily 90 tablet 3  . finasteride (PROSCAR) 5 MG tablet TAKE 1 TABLET BY MOUTH ONCE DAILY 90 tablet 1  . fluticasone (FLONASE) 50 MCG/ACT nasal spray Place 2 sprays into both nostrils as needed for allergies or rhinitis.    . Fluticasone-Salmeterol (ADVAIR) 250-50 MCG/DOSE AEPB Inhale 1 puff into the lungs daily as needed (for nasal congestion).     . gabapentin (NEURONTIN) 800 MG tablet Take 1 tablet (800 mg total) by mouth 4 (four) times daily. 360  tablet 0  . metFORMIN (GLUCOPHAGE) 500 MG tablet TAKE 1 TABLET BY MOUTH TWICE DAILY WITH FOOD 60 tablet 5  . metoprolol succinate (TOPROL-XL) 50 MG 24 hr tablet take 1 tablet by mouth once daily ALONG WITH 25 MG TO EQUAL 75MG 90 tablet 1  . Multiple Vitamin (MULTIVITAMIN) capsule Take 1 capsule by mouth daily.    . mupirocin ointment (BACTROBAN) 2 % Apply 1 application topically 2 (two) times daily. Apply to the affected area 2 times a day 22 g 3  . niacin (NIASPAN) 1000 MG CR tablet TAKE 1 TABLET BY MOUTH TWICE DAILY 180 tablet 1  . nitroGLYCERIN (NITROSTAT) 0.4 MG SL tablet     . omeprazole (PRILOSEC) 10 MG capsule take 1 capsule by mouth once daily 30 capsule 11  . Oxycodone HCl 10 MG TABS Take 1 tablet (10 mg total) by mouth every 8 (eight) hours as needed. May take an extra tablet when pain is severe 100 tablet 0  . potassium chloride SA (K-DUR,KLOR-CON) 20 MEQ tablet TAKE 1 TABLET BY MOUTH ONCE DAILY 90 tablet 0  . potassium chloride SA (K-DUR,KLOR-CON) 20 MEQ tablet TAKE 1 TABLET BY MOUTH ONCE DAILY 90 tablet 1  . rosuvastatin (CRESTOR) 40 MG tablet take 1 tablet by mouth once daily 15 tablet 0  . sacubitril-valsartan (ENTRESTO) 24-26 MG Take 1 tablet by mouth 2 (two) times daily.    [name] torsemide (DEMADEX) 20 MG tablet TAKE 1 TABLET BY MOUTH ONCE DAILY 30 tablet 5  . traMADol-acetaminophen (ULTRACET) 37.5-325 MG tablet Take 1 tablet by mouth 3 (three) times daily as needed. 90 tablet 0  . VITAMIN D, CHOLECALCIFEROL, PO Take 1 tablet by mouth daily.     . metoprolol succinate (TOPROL-XL) 25 MG 24 hr tablet TAKE 1 TABLET BY MOUTH DAILY. (Patient not taking: Reported on 09/07/2017) 90 tablet 0   No facility-administered medications prior to visit.     Allergies  Allergen Reactions  . Brimonidine Tartrate Shortness Of Breath    Alphagan-Shortness of breath  . Brimonidine Tartrate Palpitations  . Brinzolamide Shortness Of Breath    AZOPT- Shortness of breath  . Latanoprost Shortness Of  Breath    XALATAN- Shortness of breath  . Nucynta [Tapentadol] Shortness Of Breath  . Sulfa Antibiotics Palpitations  . Timolol Maleate Shortness Of  Breath and Other (See Comments)    TIMOPTIC- Aggravated asthma  . Cardizem  [Diltiazem Hcl] Swelling  . Diltiazem Swelling     leg swelling  . Rofecoxib Swelling     VIOXX- leg swelling  . Vancomycin Hives and Other (See Comments)    Possible "Red Man Syndrome"? > hives/blisters  . Codeine Other (See Comments)    Childhood reaction  . Celecoxib Other (See Comments)    CELLBREX-confusion  . Colchicine Diarrhea    diarrhea  . Tape Rash    ROS As per HPI  PE: Blood pressure 139/81, pulse 73, temperature 97.7 F (36.5 C), temperature source Oral, resp. rate 16, height 5' 11"  (1.803 m), weight (!) 322 lb (146.1 kg), SpO2 100 %. Gen: Alert, well appearing.  Patient is oriented to person, place, time, and situation. AFFECT: pleasant, lucid thought and speech. SKIN: left wrist volar surface with dime-sized deep-pink, round macule with superficial flaking of borders.  No tenderness.  No nodularity.  LABS:  none  IMPRESSION AND PLAN:  Skin lesion: left arm, unknown diagnosis.  This did not respond sufficiently to lamisil otc cream for the last 1 mo. At this point it really does not resemble tinea much at all. Question fixed drug eruption, ? Atypical psoriasis lesion.   I would do punch biopsy but am very hesitant b/c of the location of the lesion AND pt is on eliquis. Pt to call with dermatologist info to refer to.  An After Visit Summary was printed and given to the patient.  FOLLOW UP: Return for keep appt already scheduled for 10/2017..  Signed:  Crissie Sickles, MD           09/07/2017

## 2017-09-07 NOTE — Patient Instructions (Signed)
Call our office to give specific information about which dermatologist you would like to be referred to.

## 2017-09-17 ENCOUNTER — Encounter: Payer: Self-pay | Admitting: Registered Nurse

## 2017-09-17 ENCOUNTER — Encounter: Payer: Medicare Other | Attending: Registered Nurse | Admitting: Registered Nurse

## 2017-09-17 ENCOUNTER — Other Ambulatory Visit: Payer: Self-pay

## 2017-09-17 VITALS — BP 124/75 | HR 81

## 2017-09-17 DIAGNOSIS — E7800 Pure hypercholesterolemia, unspecified: Secondary | ICD-10-CM | POA: Insufficient documentation

## 2017-09-17 DIAGNOSIS — G609 Hereditary and idiopathic neuropathy, unspecified: Secondary | ICD-10-CM

## 2017-09-17 DIAGNOSIS — G8929 Other chronic pain: Secondary | ICD-10-CM

## 2017-09-17 DIAGNOSIS — K219 Gastro-esophageal reflux disease without esophagitis: Secondary | ICD-10-CM | POA: Diagnosis not present

## 2017-09-17 DIAGNOSIS — Z79891 Long term (current) use of opiate analgesic: Secondary | ICD-10-CM | POA: Diagnosis not present

## 2017-09-17 DIAGNOSIS — M25561 Pain in right knee: Secondary | ICD-10-CM | POA: Diagnosis not present

## 2017-09-17 DIAGNOSIS — M79672 Pain in left foot: Secondary | ICD-10-CM | POA: Diagnosis present

## 2017-09-17 DIAGNOSIS — M48062 Spinal stenosis, lumbar region with neurogenic claudication: Secondary | ICD-10-CM | POA: Diagnosis not present

## 2017-09-17 DIAGNOSIS — M109 Gout, unspecified: Secondary | ICD-10-CM | POA: Insufficient documentation

## 2017-09-17 DIAGNOSIS — I2510 Atherosclerotic heart disease of native coronary artery without angina pectoris: Secondary | ICD-10-CM | POA: Insufficient documentation

## 2017-09-17 DIAGNOSIS — M25562 Pain in left knee: Secondary | ICD-10-CM

## 2017-09-17 DIAGNOSIS — M1990 Unspecified osteoarthritis, unspecified site: Secondary | ICD-10-CM | POA: Insufficient documentation

## 2017-09-17 DIAGNOSIS — G894 Chronic pain syndrome: Secondary | ICD-10-CM

## 2017-09-17 DIAGNOSIS — Z79899 Other long term (current) drug therapy: Secondary | ICD-10-CM | POA: Diagnosis not present

## 2017-09-17 DIAGNOSIS — Z5181 Encounter for therapeutic drug level monitoring: Secondary | ICD-10-CM | POA: Diagnosis not present

## 2017-09-17 DIAGNOSIS — J449 Chronic obstructive pulmonary disease, unspecified: Secondary | ICD-10-CM | POA: Diagnosis not present

## 2017-09-17 MED ORDER — TRAMADOL-ACETAMINOPHEN 37.5-325 MG PO TABS: 1.0000 | ORAL_TABLET | Freq: Three times a day (TID) | ORAL | 3 refills | Status: DC | PRN

## 2017-09-17 MED ORDER — OXYCODONE HCL 10 MG PO TABS: 10.0000 mg | ORAL_TABLET | Freq: Three times a day (TID) | ORAL | 0 refills | Status: DC | PRN

## 2017-09-17 NOTE — Progress Notes (Signed)
Subjective:    Patient ID: Tim [name], male    DOB: Jun 14, 1941, 77 y.o.   MRN: [id number]  HPI:  [name] is a 77year old male who returns for follow upappointmentfor chronic pain and medication refill. He states his pain is located in his lower back, bilateral knee pain and left foot with tingling and burning pain. He rates his pain 6. His current exercise regime is walking.  Tim [name] Morphine equivalent is 54.89 MME.     Oral Swab was performed on 04/26/2017 it was consistent. UDS ordered today.   Pain Inventory Average Pain 4 Pain Right Now 6 My pain is sharp, burning, dull, stabbing, tingling and aching  In the last 24 hours, has pain interfered with the following? General activity 3 Relation with others 3 Enjoyment of life 2 What TIME of day is your pain at its worst? night Sleep (in general) Poor  Pain is worse with: walking, bending, standing and some activites Pain improves with: rest, pacing activities, medication, TENS and injections Relief from Meds: 7  Mobility walk with assistance use a cane how many minutes can you walk? 5 ability to climb steps?  no do you drive?  yes  Function retired I need assistance with the following:  meal prep, household duties and shopping  Neuro/Psych weakness numbness tremor tingling trouble walking spasms dizziness  Prior Studies Any changes since last visit?  no  Physicians involved in your care Any changes since last visit?  no   Family History  Problem Relation Age of Onset  . Hypertension Mother   . Coronary artery disease Mother   . Heart attack Mother   . Neuropathy Mother   . Pulmonary fibrosis Father        asbestosis   Social History   Socioeconomic History  . Marital status: Married    Spouse name: Not on file  . Number of children: 0  . Years of education: 8  . Highest education level: Not on file  Social Needs  . Financial resource strain: Not on file  . Food  insecurity - worry: Not on file  . Food insecurity - inability: Not on file  . Transportation needs - medical: Not on file  . Transportation needs - non-medical: Not on file  Occupational History  . Occupation: Engineer, production    Comment: retired  Tobacco Use  . Smoking status: Never Smoker  . Smokeless tobacco: Never Used  Substance and Sexual Activity  . Alcohol use: No    Alcohol/week: 0.0 oz  . Drug use: No  . Sexual activity: Not Currently  Other Topics Concern  . Not on file  Social History Narrative   HSG, [name] - BS, [name] - MS-engineering, 2 years on PhD - [employer]. Married - '65 - 8yr/divorced; '76- 3 yrs/divorced; '92 . No children. 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 ACP/Living Will - Yes CPR; long-term Mechanical ventilation as long as he was able to cognate; ok for long term artificial nutrition. Precondition being able to cognate and not to have too much pain.    Past Surgical History:  Procedure Laterality Date  . AMPUTATION Left 04/11/2013   Procedure: AMPUTATION DIGIT Left 3rd toe;  Surgeon: [name] MD;  Location: [facility]  Service: Orthopedics;  Laterality: Left;  Left 3rd toe amputation at [facility]  . CARDIAC CATHETERIZATION  1997; 03/10/16   1997 Non-obstructive disease.  03/2016 BMS to RCA, with 25% pDiag  dz, o/w normal cors per cath 03/07/16.  Cath 11/01/16: in stent restenosis, successful baloon angioplasty.  [name] CARDIAC CATHETERIZATION  12/24/2012   mild < 20% LCx, prox 30% RCA; LVEF 55-65% , moderate pulmonary HTN, moderate AS  . CARDIOVASCULAR STRESS TEST  05/11/16 ([facility])   Myocardial perfusion imaging:  No ischemia; scar in apex, global hypokinesis, EF 36%.  . Carotid dopplers  03/09/2016   [facility]: no hemodynamically significant stenosis on either side.  . CHOLECYSTECTOMY    . COLONOSCOPY    . COLONOSCOPY [name] 10/30/2012   Procedure: COLONOSCOPY;  Surgeon: [name], MD;  Location: [facility];  Service: Endoscopy;   Laterality: [name];  . CORONARY ANGIOPLASTY WITH STENT PLACEMENT  03/2016; 04/2017   2017-[facility]: BMS to RCA-pt was placed on Brilinta.  04/2017: DES to RCA.  [name] EYE SURGERY Bilateral cataract  . HEMORRHOID SURGERY    . INTRAOCULAR LENS INSERTION Bilateral   . KNEE SURGERY Right   . LEFT AND RIGHT HEART CATHETERIZATION WITH CORONARY ANGIOGRAM [name] 12/24/2012   Procedure: LEFT AND RIGHT HEART CATHETERIZATION WITH CORONARY ANGIOGRAM;  Surgeon: [name], MD;  Location: [name] CATH LAB;  Service: Cardiovascular;  Laterality: [name];  . LEG SURGERY Bilateral    lenghtening   . PACEMAKER PLACEMENT  04/13/2016   2nd deg HB after TAVR, pt had DC MDT PPM placed.  [name] SHOULDER ARTHROSCOPY  08/30/2011   Procedure: ARTHROSCOPY SHOULDER;  Surgeon: [name], MD;  Location: [place];  Service: Orthopedics;  Laterality: Right;  Right Shoulder Arthroscopy, Debridement, and Decompression  . SPINAL CORD STIMULATOR INSERTION [name] 09/10/2015   Procedure: LUMBAR SPINAL CORD STIMULATOR INSERTION;  Surgeon: [name], MD;  Location: [place] [facility];  Service: Neurosurgery;  Laterality: [name];  . 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  R big toe surg due to osteoarth  . TONSILLECTOMY    . traeculectomy Left    eye  . TRANSCATHETER AORTIC VALVE REPLACEMENT, TRANSFEMORAL  04/11/2016  . TRANSESOPHAGEAL ECHOCARDIOGRAM  03/09/2016   [facility]: EF 55-60%, PFO seen with bi-directional shunting, no thrombus in appendage.  . TRANSTHORACIC ECHOCARDIOGRAM  01/2015; 01/2016; 05/18/16; 09/18/16, 05/2017   01/2015 No signif change in aortic stenosis (moderate).  01/2016 Severe LVH w/small LV cavity, EF 60-65%, grade I diast dysfxn.  05/2016 (s/p TAVR): EF 50-55%, grd I DD, biopros AV good.  08/2016--EF 50-55%, LV septal motion c/w conduction abnl, grd I DD,mild MS,bioprosth aortic valve well seated, w/trace aortic regurg. 05/2017 TTE EF 35%.  [name] VITRECTOMY     Past Medical History:  Diagnosis Date  . ASTHMA   . BPH (benign prostatic  hypertrophy)   . CAD (coronary artery disease)    Nonobstructive by 12/2012 cath; then 03/2016 he required BMS to RCA ([facility]).  In-stent restenosis on cath 11/01/16, baloon angioplasty successful--pt to take plavix (no ASA), + eliquis (for PAF).  [name] CATARACT, HX OF   . Chronic combined systolic and diastolic CHF (congestive heart failure) ([place]) 05/2016   Mild[facility]  . Chronic pain syndrome    Lumbar DDD; chronic neuropathic pain (DM); has spinal stimulator and sees pain mgmt MD  . COPD   . Diabetes mellitus type 2 with complications (HCC)    [id number] from 5.7% to 6.1% 03/2015---started metformin at that time.  DM 2 dx by fasting gluc criteria 2018.  Has chronic neuropath pain  . Elevated transaminase level 02/2016   Suspect due to fatty liver (documented  on u/s 2007). Plan repeat labs 03/2016.  [name] Essential hypertension, benign   . Fatty liver 2007   2007 u/s showed fatty liver with hepatosplenomegaly.  2019 repeat u/s->fatty liver but no cirrhosis or hepatosplenomegaly.  [name] GERD (gastroesophageal reflux disease)   . Glaucoma   . GOUT   . Hallux valgus (acquired)   . HH (hiatus hernia)   . HYPERCHOLESTEROLEMIA-PURE   . Hypogonadism male   . Lumbosacral neuritis   . Lumbosacral spondylosis    Lumbar spinal stenosis with neurogenic claudication--contributes to his chronic pain syndrome  . Normal memory function 08/2014   Neuropsychological testing ([facility]): no cognitive impairment or sign of neurodegenerative disorder.  Likely has adjustment d/o with mixed anxiety/depressed features and may benefit from low dose SNRI.    [name] Normocytic anemia 03/2016   Mild-pt needs ferritin and vit B12 level checked (as of 03/22/16)  . NSTEMI (non-ST elevated myocardial infarction) ([place]) 03/20/2016   BMS to RCA  . Obesity hypoventilation syndrome ([place])   . Obesity, unspecified   . Orthostatic hypotension   . OSA on CPAP    8 cm H2O  .  OSTEOARTHRITIS   . Paroxysmal atrial fibrillation ([place]) 2003    (? chronic?) Off anticoag for a while due to falls.  Then apixaban started 12/2014.  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 Ella Bodo)  . Personal history of colonic adenoma 10/30/2012   Diminutive adenoma, consider repeat 2019 per GI  . Primary osteoarthritis of both knees    Bone on bone of medial compartments, + signif patellofemoral arth bilat.  . PUD (peptic ulcer disease)   . PULMONARY HYPERTENSION, HX OF   . Secondary male hypogonadism 2017  . 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 Shortness of breath    with exertion: much improved s/p TAVR and treatment for CHF.  [name] Thrombocytopenia ([place]) 2018   HSM on 2007 abd u/s---suspect some mild splenic sequestration chronically.  [name] Unspecified glaucoma(365.9)   . Unspecified hereditary and idiopathic peripheral neuropathy approx age 70   bilat LE's, ? left arm, too.  Feet became progressively numb + left foot pain intermittently.  Pt may be trying a spinal stimulator (as of 05/2015)  . VENOUS INSUFFICIENCY    Being followed by Dr. Sharol Given as of 10/2016 for two R LL venous stasis ulcers/skin tears.  Healed as of 10/30/16 f/u with Dr. Sharol Given.  . VENTRAL HERNIA    BP 124/75   Pulse 81   SpO2 94%   Opioid Risk Score:  1 Fall Risk Score:  `1  Depression screen PHQ 2/9  Depression screen [facility] 2/9 09/17/2017 08/20/2017 07/18/2017 05/22/2017 02/06/2017 01/25/2017 09/23/2015  Decreased Interest 0 0 0 0 0 0 0  Down, Depressed, Hopeless 0 0 0 0 0 0 0  PHQ - 2 Score 0 0 0 0 0 0 0  Altered sleeping - - - - - - -  Tired, decreased energy - - - - - - -  Change in appetite - - - - - - -  Feeling bad or failure about yourself  - - - - - - -  Trouble concentrating - - - - - - -  Moving slowly or fidgety/restless - - - - - - -  Suicidal thoughts - - - - - - -  PHQ-9 Score - - - - - - -  Some  recent data might be hidden     Review of Systems  HENT: Negative.   Eyes: Negative.   Respiratory: Negative.   Cardiovascular: Negative.   Gastrointestinal: Negative.   Endocrine: Negative.        High blood sugar  Genitourinary: Negative.   Musculoskeletal: Positive for arthralgias, back pain, gait problem and joint swelling.  Skin: Negative.   Allergic/Immunologic: Negative.   Neurological: Positive for tremors, weakness and numbness.       Tingling  Hematological: Bruises/bleeds easily.  Psychiatric/Behavioral: Negative.   All other systems reviewed and are negative.      Objective:   Physical Exam  Constitutional: He is oriented to person, place, and time. He appears well-developed and well-nourished.  HENT:  Head: Normocephalic and atraumatic.  Neck: Normal range of motion. Neck supple.  Cardiovascular: Normal rate and regular rhythm.  Pulmonary/Chest: Effort normal and breath sounds normal.  Musculoskeletal:  Normal Muscle Bulk and Muscle Testing reveals: Upper Extremities: Right: DecreasedROM 45 Degrees and Muscle Strength 4/5 Left: Full ROM and Muscle Strength 5/5 Thoracic Paraspinal Tenderness: T-1-T-3 Lower Extremities: Full ROM and Muscle Strength 5/5 Arises from chair slowly using straight cane for support Narrow Based  Gait    Neurological: He is alert and oriented to person, place, and time.  Skin: Skin is warm and dry.  Psychiatric: He has a normal mood and affect.  Nursing note and vitals reviewed.         Assessment & Plan:  1. Diabetic neuropathy with chronic neuropathic pain: Using Tens Unit: Continue  Gabapentin 800 mg QID. 09/17/2017 Refilled:Oxycodone 10 mg one tablet every 8 hours as needed , may take an extra tablet when pain is severe #100 and Tramadol 37.5/325 mg TID as needed.  We will continue with the slow weaning of Tramadol, he verbalizes understanding, encouraged to continue with Pain Journal.  We will continue the opioid  monitoring program, this consists of regular clinic visits, examinations, urine drug screen, pill counts as well as use of [place] Controlled Substance Reporting System. 2. DDD lumbar spine with LBP: Continue to increase activity as tolerated and heat therapy. 09/17/2017 S/P Permanent Spinal Stimulator Placed 09/10/15 via Dr. Maryjean Ka. 3. Bilateral Chronic  Knee Pain: Orthopedist Following.Continue to Monitor. 09/17/2017.  20 minutes of face to face patient care time was spent during this visit. All questions were encouraged and answered.  F/U in 1 month

## 2017-09-21 ENCOUNTER — Encounter: Payer: Self-pay | Admitting: Family Medicine

## 2017-09-22 LAB — TOXASSURE SELECT,+ANTIDEPR,UR

## 2017-09-26 ENCOUNTER — Telehealth: Payer: Self-pay | Admitting: *Deleted

## 2017-09-26 NOTE — Telephone Encounter (Signed)
Urine drug screen for this encounter is consistent for prescribed medication 

## 2017-10-02 ENCOUNTER — Encounter: Payer: Self-pay | Admitting: Family Medicine

## 2017-10-06 ENCOUNTER — Other Ambulatory Visit: Payer: Self-pay | Admitting: Family Medicine

## 2017-10-16 ENCOUNTER — Encounter: Payer: Self-pay | Admitting: Registered Nurse

## 2017-10-16 ENCOUNTER — Encounter: Payer: Medicare Other | Attending: Registered Nurse | Admitting: Registered Nurse

## 2017-10-16 VITALS — BP 96/63 | HR 67 | Ht 72.0 in | Wt 322.0 lb

## 2017-10-16 DIAGNOSIS — M48062 Spinal stenosis, lumbar region with neurogenic claudication: Secondary | ICD-10-CM

## 2017-10-16 DIAGNOSIS — J449 Chronic obstructive pulmonary disease, unspecified: Secondary | ICD-10-CM | POA: Diagnosis not present

## 2017-10-16 DIAGNOSIS — E7800 Pure hypercholesterolemia, unspecified: Secondary | ICD-10-CM | POA: Insufficient documentation

## 2017-10-16 DIAGNOSIS — G8929 Other chronic pain: Secondary | ICD-10-CM

## 2017-10-16 DIAGNOSIS — K219 Gastro-esophageal reflux disease without esophagitis: Secondary | ICD-10-CM | POA: Insufficient documentation

## 2017-10-16 DIAGNOSIS — I2510 Atherosclerotic heart disease of native coronary artery without angina pectoris: Secondary | ICD-10-CM | POA: Insufficient documentation

## 2017-10-16 DIAGNOSIS — Z5181 Encounter for therapeutic drug level monitoring: Secondary | ICD-10-CM | POA: Diagnosis not present

## 2017-10-16 DIAGNOSIS — M25562 Pain in left knee: Secondary | ICD-10-CM | POA: Diagnosis not present

## 2017-10-16 DIAGNOSIS — G609 Hereditary and idiopathic neuropathy, unspecified: Secondary | ICD-10-CM

## 2017-10-16 DIAGNOSIS — M25561 Pain in right knee: Secondary | ICD-10-CM

## 2017-10-16 DIAGNOSIS — Z79891 Long term (current) use of opiate analgesic: Secondary | ICD-10-CM

## 2017-10-16 DIAGNOSIS — M109 Gout, unspecified: Secondary | ICD-10-CM | POA: Diagnosis not present

## 2017-10-16 DIAGNOSIS — G894 Chronic pain syndrome: Secondary | ICD-10-CM | POA: Diagnosis not present

## 2017-10-16 DIAGNOSIS — M1990 Unspecified osteoarthritis, unspecified site: Secondary | ICD-10-CM | POA: Insufficient documentation

## 2017-10-16 DIAGNOSIS — M79672 Pain in left foot: Secondary | ICD-10-CM | POA: Diagnosis present

## 2017-10-16 MED ORDER — GABAPENTIN 800 MG PO TABS: 800.0000 mg | ORAL_TABLET | Freq: Four times a day (QID) | ORAL | 2 refills | Status: DC

## 2017-10-16 MED ORDER — OXYCODONE HCL 10 MG PO TABS: 10.0000 mg | ORAL_TABLET | Freq: Three times a day (TID) | ORAL | 0 refills | Status: DC | PRN

## 2017-10-16 NOTE — Progress Notes (Signed)
Subjective:    Patient ID: Tim [name], male    DOB: 09-Jan-1941, 76 y.o.   MRN: [id number]  HPI: [name] is a 77 year old male who returns for follow up appointment for chronic pain and medication refill. He states his pain is located in his lower back. He rates his pain 3. His current exercise regime is walking.   [name] states he's scheduled to have a defibrillator placed on Nov 07, 2017 at [facility].   [name] Morphine equivalent is 61.64 MME.  His last UDS was Performed on 09/17/2017 it was consistent.   Pain Inventory Average Pain 6 Pain Right Now 3 My pain is intermittent, sharp, burning, dull, stabbing, tingling and aching  In the last 24 hours, has pain interfered with the following? General activity 4 Relation with others 7 Enjoyment of life 3 What TIME of day is your pain at its worst? . Sleep (in general) Poor  Pain is worse with: walking, bending, standing and some activites Pain improves with: rest, pacing activities, medication, TENS and injections Relief from Meds: 7  Mobility use a cane ability to climb steps?  no do you drive?  yes  Function retired  Neuro/Psych weakness numbness tremor tingling trouble walking  Prior Studies Any changes since last visit?  no  Physicians involved in your care Any changes since last visit?  no   Family History  Problem Relation Age of Onset  . Hypertension Mother   . Coronary artery disease Mother   . Heart attack Mother   . Neuropathy Mother   . Pulmonary fibrosis Father        asbestosis   Social History   Socioeconomic History  . Marital status: Married    Spouse name: Not on file  . Number of children: 0  . Years of education: 75  . Highest education level: Not on file  Occupational History  . Occupation: Engineer, production    Comment: retired  Scientific laboratory technician  . Financial resource strain: Not on file  . Food insecurity:    Worry: Not on file    Inability: Not on  file  . Transportation needs:    Medical: Not on file    Non-medical: Not on file  Tobacco Use  . Smoking status: Never Smoker  . Smokeless tobacco: Never Used  Substance and Sexual Activity  . Alcohol use: No    Alcohol/week: 0.0 oz  . Drug use: No    Types: Oxycodone  . Sexual activity: Not Currently  Lifestyle  . Physical activity:    Days per week: Not on file    Minutes per session: Not on file  . Stress: Not on file  Relationships  . Social connections:    Talks on phone: Not on file    Gets together: Not on file    Attends religious service: Not on file    Active member of club or organization: Not on file    Attends meetings of clubs or organizations: Not on file    Relationship status: Not on file  Other Topics Concern  . Not on file  Social History Narrative   HSG, [name] - BS, [name] - MS-engineering, 2 years on PhD - [place]. Married - '65 - 28yr/divorced; '76- 3 yrs/divorced; '92 . No children. 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 ACP/Living Will - Yes CPR; long-term Mechanical ventilation as long as he was able to cognate; ok for long  term artificial nutrition. Precondition being able to cognate and not to have too much pain.    Past Surgical History:  Procedure Laterality Date  . AMPUTATION Left 04/11/2013   Procedure: AMPUTATION DIGIT Left 3rd toe;  Surgeon: [name], MD;  Location: [place];  Service: Orthopedics;  Laterality: Left;  Left 3rd toe amputation at [facility]  . CARDIAC CATHETERIZATION  1997; 03/10/16   1997 Non-obstructive disease.  03/2016 BMS to RCA, with 25% pDiag dz, o/w normal cors per cath 03/07/16.  Cath 11/01/16: in stent restenosis, successful baloon angioplasty.  [name] CARDIAC CATHETERIZATION  12/24/2012   mild < 20% LCx, prox 30% RCA; LVEF 55-65% , moderate pulmonary HTN, moderate AS  . CARDIOVASCULAR STRESS TEST  05/11/16 ([facility])   Myocardial perfusion imaging:  No ischemia; scar in apex, global hypokinesis, EF  36%.  . Carotid dopplers  03/09/2016   [facility]: no hemodynamically significant stenosis on either side.  . CHOLECYSTECTOMY    . COLONOSCOPY    . COLONOSCOPY [name] 10/30/2012   Procedure: COLONOSCOPY;  Surgeon: [name], MD;  Location: [facility];  Service: Endoscopy;  Laterality: [name];  . CORONARY ANGIOPLASTY WITH STENT PLACEMENT  03/2016; 04/2017   2017-[facility]: BMS to RCA-pt was placed on Brilinta.  04/2017: DES to RCA.  [name] EYE SURGERY Bilateral cataract  . HEMORRHOID SURGERY    . INTRAOCULAR LENS INSERTION Bilateral   . KNEE SURGERY Right   . LEFT AND RIGHT HEART CATHETERIZATION WITH CORONARY ANGIOGRAM [name] 12/24/2012   Procedure: LEFT AND RIGHT HEART CATHETERIZATION WITH CORONARY ANGIOGRAM;  Surgeon: [name], MD;  Location: [facility] CATH LAB;  Service: Cardiovascular;  Laterality: [name];  . LEG SURGERY Bilateral    lenghtening   . PACEMAKER PLACEMENT  04/13/2016   2nd deg HB after TAVR, pt had DC MDT PPM placed.  [name] SHOULDER ARTHROSCOPY  08/30/2011   Procedure: ARTHROSCOPY SHOULDER;  Surgeon: [name], MD;  Location: [place];  Service: Orthopedics;  Laterality: Right;  Right Shoulder Arthroscopy, Debridement, and Decompression  . SPINAL CORD STIMULATOR INSERTION [name] 09/10/2015   Procedure: LUMBAR SPINAL CORD STIMULATOR INSERTION;  Surgeon: [name], MD;  Location: [place] NEURO ORS;  Service: Neurosurgery;  Laterality: [name];  . 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  R big toe surg due to osteoarth  . TONSILLECTOMY    . traeculectomy Left    eye  . TRANSCATHETER AORTIC VALVE REPLACEMENT, TRANSFEMORAL  04/11/2016  . TRANSESOPHAGEAL ECHOCARDIOGRAM  03/09/2016   [facility]: EF 55-60%, PFO seen with bi-directional shunting, no thrombus in appendage.  . TRANSTHORACIC ECHOCARDIOGRAM  01/2015; 01/2016; 05/18/16; 09/18/16, 05/2017, 08/2017   01/2015 No signif change in aortic stenosis (moderate).  01/2016 Severe LVH w/small LV cavity, EF 60-65%, grade I diast dysfxn.  05/2016 (s/p TAVR): EF  50-55%, grd I DD, biopros AV good.  08/2016--EF 50-55%, LV septal motion c/w conduction abnl, grd I DD,mild MS,bioprosth aortic valve well seated, w/trace AR. 05/2017 TTE EF 35%. 08/2017-EF 35%, mod diff hypokin LV, grd I DD, biopros AV good.  [name] VITRECTOMY     Past Medical History:  Diagnosis Date  . ASTHMA   . BPH (benign prostatic hypertrophy)   . CAD (coronary artery disease)    Nonobstructive by 12/2012 cath; then 03/2016 he required BMS to RCA ([facility]).  In-stent restenosis on cath 11/01/16, baloon angioplasty successful--pt to take plavix (no ASA), + eliquis (for PAF).  [name] CATARACT, HX OF   . Chronic combined systolic and diastolic CHF (  congestive heart failure) ([place]) 05/2016   Ischemic CM.  [name] Chronic pain syndrome    Lumbar DDD; chronic neuropathic pain (DM); has spinal stimulator and sees pain mgmt MD  . COPD   . Diabetes mellitus type 2 with complications (HCC)    ZOX0R jumped from 5.7% to 6.1% 03/2015---started metformin at that time.  DM 2 dx by fasting gluc criteria 2018.  Has chronic neuropath pain  . Elevated transaminase level 02/2016   Suspect due to fatty liver (documented on u/s 2007). Plan repeat labs 03/2016.  [name] Essential hypertension, benign   . Fatty liver 2007   2007 u/s showed fatty liver with hepatosplenomegaly.  2019 repeat u/s->fatty liver but no cirrhosis or hepatosplenomegaly.  [name] GERD (gastroesophageal reflux disease)   . Glaucoma   . GOUT   . Hallux valgus (acquired)   . HH (hiatus hernia)   . HYPERCHOLESTEROLEMIA-PURE   . Hypogonadism male   . Lumbosacral neuritis   . Lumbosacral spondylosis    Lumbar spinal stenosis with neurogenic claudication--contributes to his chronic pain syndrome  . Normal memory function 08/2014   Neuropsychological testing ([facility]): no cognitive impairment or sign of neurodegenerative disorder.  Likely has adjustment d/o with mixed anxiety/depressed features and may benefit from low dose SNRI.    [name] Normocytic anemia  03/2016   Mild-pt needs ferritin and vit B12 level checked (as of 03/22/16)  . NSTEMI (non-ST elevated myocardial infarction) ([place]) 03/20/2016   BMS to RCA  . Obesity hypoventilation syndrome ([place])   . Obesity, unspecified   . Orthostatic hypotension   . OSA on CPAP    8 cm H2O  . OSTEOARTHRITIS   . Paroxysmal atrial fibrillation ([place]) 2003    (? chronic?) Off anticoag for a while due to falls.  Then apixaban started 12/2014.  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 Ella Bodo)  . Personal history of colonic adenoma 10/30/2012   Diminutive adenoma, consider repeat 2019 per GI  . Primary osteoarthritis of both knees    Bone on bone of medial compartments, + signif patellofemoral arth bilat.--supartz inj series started 09/12/17  . PUD (peptic ulcer disease)   . PULMONARY HYPERTENSION, HX OF   . Secondary male hypogonadism 2017  . 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 Shortness of breath    with exertion: much improved s/p TAVR and treatment for CHF.  . Sick sinus syndrome (HCC)    PPM placed  . Thrombocytopenia ([place]) 2018   HSM on 2007 abd u/s---suspect some mild splenic sequestration chronically.  [name] Unspecified glaucoma(365.9)   . Unspecified hereditary and idiopathic peripheral neuropathy approx age 58   bilat LE's, ? left arm, too.  Feet became progressively numb + left foot pain intermittently.  Pt may be trying a spinal stimulator (as of 05/2015)  . VENOUS INSUFFICIENCY    Being followed by Dr. Sharol Given as of 10/2016 for two R LL venous stasis ulcers/skin tears.  Healed as of 10/30/16 f/u with Dr. Sharol Given.  . VENTRAL HERNIA    There were no vitals taken for this visit.  Opioid Risk Score:   Fall Risk Score:  `1  Depression screen PHQ 2/9  Depression screen [facility] 2/9 09/17/2017 08/20/2017 07/18/2017 05/22/2017 02/06/2017 01/25/2017 09/23/2015  Decreased Interest 0 0 0 0 0 0 0  Down,  Depressed, Hopeless 0 0 0 0 0 0 0  PHQ - 2  Score 0 0 0 0 0 0 0  Altered sleeping - - - - - - -  Tired, decreased energy - - - - - - -  Change in appetite - - - - - - -  Feeling bad or failure about yourself  - - - - - - -  Trouble concentrating - - - - - - -  Moving slowly or fidgety/restless - - - - - - -  Suicidal thoughts - - - - - - -  PHQ-9 Score - - - - - - -  Some recent data might be hidden     Review of Systems  Constitutional: Negative.   HENT: Negative.   Eyes: Negative.   Respiratory: Positive for apnea and shortness of breath.   Cardiovascular: Positive for leg swelling.  Gastrointestinal: Negative.   Endocrine: Negative.   Genitourinary: Negative.   Musculoskeletal: Positive for gait problem and joint swelling.  Skin: Positive for rash.  Allergic/Immunologic: Negative.   Hematological: Bruises/bleeds easily.  Psychiatric/Behavioral: Negative.   All other systems reviewed and are negative.      Objective:   Physical Exam  Constitutional: He is oriented to person, place, and time. He appears well-developed and well-nourished.  HENT:  Head: Normocephalic and atraumatic.  Neck: Normal range of motion. Neck supple.  Cardiovascular: Normal rate and regular rhythm.  Pulmonary/Chest: Effort normal and breath sounds normal.  Musculoskeletal:  Normal Muscle Bulk and Muscle Testing Reveals: Upper Extremities: Right: Decreased ROM 45 Degrees and Muscle Strength 4/5 Left: Full ROM and Muscle Strength 5/5 Bilateral AC Joint Tenderness Thoracic Paraspinal Tenderness: T-3-T-4 T-7-T-9 Lower Extremities: Full ROM and Muscle Strength 5/5 Arises from Chair slowly using straight cane for support Narrow Based Gait  Neurological: He is alert and oriented to person, place, and time.  Skin: Skin is warm and dry.  Psychiatric: He has a normal mood and affect.  Nursing note and vitals reviewed.         Assessment & Plan:  1. Diabetic neuropathy with chronic neuropathic  pain: Using Tens Unit: Continue current medication with  Gabapentin 800 mg QID. 10/16/2017 Refilled:Oxycodone 10 mg one tablet every 8 hours as needed , may take an extra tablet when pain is severe #100 and Tramadol 37.5/325 mg TID as needed.  We will continue with the slow weaning of Tramadol, he verbalizes understanding, encouraged to continue with Pain Journal.  We will continue the opioid monitoring program, this consists of regular clinic visits, examinations, urine drug screen, pill counts as well as use of [place] Controlled Substance Reporting System. 2. DDD lumbar spine with LBP: Continue to increase activity as tolerated and heat therapy. 10/16/2017 S/P Permanent Spinal Stimulator Placed 09/10/15 via Dr. Maryjean Ka. 3. Bilateral Chronic  Knee Pain: Orthopedist Following.Continue to Monitor. 10/16/2017.  20 minutes of face to face patient care time was spent during this visit. All questions were encouraged and answered.  F/U in 1 month

## 2017-10-18 ENCOUNTER — Other Ambulatory Visit: Payer: Self-pay | Admitting: Family Medicine

## 2017-10-22 ENCOUNTER — Other Ambulatory Visit: Payer: Self-pay | Admitting: Family Medicine

## 2017-10-25 ENCOUNTER — Encounter: Payer: Self-pay | Admitting: Internal Medicine

## 2017-11-03 ENCOUNTER — Other Ambulatory Visit: Payer: Self-pay | Admitting: Family Medicine

## 2017-11-07 ENCOUNTER — Ambulatory Visit: Payer: Medicare Other | Admitting: Family Medicine

## 2017-11-07 DIAGNOSIS — Z95810 Presence of automatic (implantable) cardiac defibrillator: Secondary | ICD-10-CM

## 2017-11-07 HISTORY — PX: CARDIAC DEFIBRILLATOR PLACEMENT: SHX171

## 2017-11-07 HISTORY — DX: Presence of automatic (implantable) cardiac defibrillator: Z95.810

## 2017-11-14 ENCOUNTER — Telehealth: Payer: Self-pay | Admitting: *Deleted

## 2017-11-14 NOTE — Telephone Encounter (Signed)
Copied from [place] [phone]. Topic: Inquiry >> Nov 13, 2017  1:41 PM Arletha Grippe wrote: Reason for CRM: pt called -  Pt needs to have another handicap form filled out - his card is expiring.  He needs to have a form printed out, he has lost the form that came in the mail.  Cb is [phone]  >> Nov 14, 2017 10:40 AM Onalee Hua, CMA wrote: Done. Put in mail to pt (per pts request). Copy made for chart. >> Nov 14, 2017 10:40 AM Onalee Hua, CMA wrote: Pt advised and voiced understanding.

## 2017-11-19 ENCOUNTER — Other Ambulatory Visit: Payer: Self-pay | Admitting: Family Medicine

## 2017-11-19 NOTE — Telephone Encounter (Signed)
Copied from [place] [id number]. Topic: Quick Communication - See Telephone Encounter >> Nov 19, 2017  2:33 PM Conception Chancy, NT wrote: CRM for notification. See Telephone encounter for: 11/19/17.  Patient is needing a refill on potassium chloride SA (K-DUR,KLOR-CON) 20 MEQ tablet. He states he needs a new RX he is switching [facility] [place] [id number]: [phone]: [phone]

## 2017-11-20 MED ORDER — POTASSIUM CHLORIDE CRYS ER 20 MEQ PO TBCR: 20.0000 meq | EXTENDED_RELEASE_TABLET | Freq: Every day | ORAL | 1 refills | Status: DC

## 2017-11-20 NOTE — Telephone Encounter (Signed)
Potassium  LOV: 08/09/17 Last Refill: PCP: Dr Ricardo Jericho Pharmacy: [facility] [address]

## 2017-11-20 NOTE — Telephone Encounter (Signed)
Please advise. Thanks.  

## 2017-11-22 ENCOUNTER — Encounter: Payer: Self-pay | Admitting: Registered Nurse

## 2017-11-22 ENCOUNTER — Encounter: Payer: Medicare Other | Attending: Registered Nurse | Admitting: Registered Nurse

## 2017-11-22 VITALS — BP 135/65 | HR 61 | Resp 14 | Ht 72.0 in | Wt 329.0 lb

## 2017-11-22 DIAGNOSIS — J449 Chronic obstructive pulmonary disease, unspecified: Secondary | ICD-10-CM | POA: Diagnosis not present

## 2017-11-22 DIAGNOSIS — M48062 Spinal stenosis, lumbar region with neurogenic claudication: Secondary | ICD-10-CM

## 2017-11-22 DIAGNOSIS — E7800 Pure hypercholesterolemia, unspecified: Secondary | ICD-10-CM | POA: Insufficient documentation

## 2017-11-22 DIAGNOSIS — G8929 Other chronic pain: Secondary | ICD-10-CM

## 2017-11-22 DIAGNOSIS — K219 Gastro-esophageal reflux disease without esophagitis: Secondary | ICD-10-CM | POA: Diagnosis not present

## 2017-11-22 DIAGNOSIS — M792 Neuralgia and neuritis, unspecified: Secondary | ICD-10-CM

## 2017-11-22 DIAGNOSIS — M25562 Pain in left knee: Secondary | ICD-10-CM | POA: Diagnosis not present

## 2017-11-22 DIAGNOSIS — M1990 Unspecified osteoarthritis, unspecified site: Secondary | ICD-10-CM | POA: Diagnosis not present

## 2017-11-22 DIAGNOSIS — G609 Hereditary and idiopathic neuropathy, unspecified: Secondary | ICD-10-CM | POA: Diagnosis not present

## 2017-11-22 DIAGNOSIS — Z79891 Long term (current) use of opiate analgesic: Secondary | ICD-10-CM | POA: Diagnosis not present

## 2017-11-22 DIAGNOSIS — M109 Gout, unspecified: Secondary | ICD-10-CM | POA: Diagnosis not present

## 2017-11-22 DIAGNOSIS — M25561 Pain in right knee: Secondary | ICD-10-CM

## 2017-11-22 DIAGNOSIS — Z5181 Encounter for therapeutic drug level monitoring: Secondary | ICD-10-CM

## 2017-11-22 DIAGNOSIS — M79672 Pain in left foot: Secondary | ICD-10-CM | POA: Diagnosis present

## 2017-11-22 DIAGNOSIS — I2510 Atherosclerotic heart disease of native coronary artery without angina pectoris: Secondary | ICD-10-CM | POA: Diagnosis not present

## 2017-11-22 DIAGNOSIS — G894 Chronic pain syndrome: Secondary | ICD-10-CM | POA: Diagnosis not present

## 2017-11-22 MED ORDER — TRAMADOL-ACETAMINOPHEN 37.5-325 MG PO TABS: 1.0000 | ORAL_TABLET | Freq: Three times a day (TID) | ORAL | 3 refills | Status: DC | PRN

## 2017-11-22 MED ORDER — GABAPENTIN 800 MG PO TABS: 800.0000 mg | ORAL_TABLET | Freq: Four times a day (QID) | ORAL | 2 refills | Status: DC

## 2017-11-22 NOTE — Patient Instructions (Signed)
Please call office after June 5th: To Discuss Pain Log.   Keep a Log on Pain medication

## 2017-11-22 NOTE — Progress Notes (Signed)
Subjective:    Patient ID: Tim [name], male    DOB: [birth year], 77 y.o.   MRN: [id number]  HPI: Tim [name] is a 77 year old male who returns for follow up appointment for chronic pain and medication refill. He states his pain is located in his lower back and left foot. Also states the last few weeks his pain has increased in intensity in his left foot. He rates his pain 4. His current exercise regime is walking.   Tim [name] was instructed to keep a pain and medication log, and call office in two weeks, he verbalizes understanding.   Tim [name] is 60.00 MME.  Last UDS was Performed on 09/17/2017, it was consistent.    Pain Inventory Average Pain 8 Pain Right Now 4 My pain is intermittent, sharp, dull, stabbing, tingling and aching  In the last 24 hours, has pain interfered with the following? General activity 8 Relation with others 2 Enjoyment of life 8 What TIME of day is your pain at its worst? night Sleep (in general) Poor  Pain is worse with: walking, bending, standing and some activites Pain improves with: rest, pacing activities and medication Relief from Meds: 4  Mobility walk with assistance use a cane how many minutes can you walk? 3 ability to climb steps?  no  Function retired I need assistance with the following:  meal prep, household duties and shopping  Neuro/Psych weakness numbness tremor tingling  Prior Studies Any changes since last visit?  no  Physicians involved in your care Any changes since last visit?  no   Family History  Problem Relation Age of Onset  . Hypertension Mother   . Coronary artery disease Mother   . Heart attack Mother   . Neuropathy Mother   . Pulmonary fibrosis Father        asbestosis   Social History   Socioeconomic History  . Marital status: Married    Spouse name: Not on file  . Number of children: 0  . Years of education: 66  . Highest education level: Not on file    Occupational History  . Occupation: Engineer, production    Comment: retired  Scientific laboratory technician  . Financial resource strain: Not on file  . Food insecurity:    Worry: Not on file    Inability: Not on file  . Transportation needs:    Medical: Not on file    Non-medical: Not on file  Tobacco Use  . Smoking status: Never Smoker  . Smokeless tobacco: Never Used  Substance and Sexual Activity  . Alcohol use: No    Alcohol/week: 0.0 oz  . Drug use: No    Types: Oxycodone  . Sexual activity: Not Currently  Lifestyle  . Physical activity:    Days per week: Not on file    Minutes per session: Not on file  . Stress: Not on file  Relationships  . Social connections:    Talks on phone: Not on file    Gets together: Not on file    Attends religious service: Not on file    Active member of club or organization: Not on file    Attends meetings of clubs or organizations: Not on file    Relationship status: Not on file  Other Topics Concern  . Not on file  Social History Narrative   HSG, [name] - BS, [name] - MS-engineering, 2 years on PhD - [employer]. Married - '65 -  29yr/divorced; '76- 3 yrs/divorced; '92 . No children. 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 ACP/Living Will - Yes CPR; long-term Mechanical ventilation as long as he was able to cognate; ok for long term artificial nutrition. Precondition being able to cognate and not to have too much pain.    Past Surgical History:  Procedure Laterality Date  . AMPUTATION Left 04/11/2013   Procedure: AMPUTATION DIGIT Left 3rd toe;  Surgeon: [name] MD;  [facility]  Service: Orthopedics;  Laterality: Left;  Left 3rd toe amputation at [facility]  . CARDIAC CATHETERIZATION  1997; 03/10/16   1997 Non-obstructive disease.  03/2016 BMS to RCA, with 25% pDiag dz, o/w normal cors per cath 03/07/16.  Cath 11/01/16: in stent restenosis, successful baloon angioplasty.  .Marland KitchenCARDIAC CATHETERIZATION  12/24/2012   mild < 20% LCx,  prox 30% RCA; LVEF 55-65% , moderate pulmonary HTN, moderate AS  . CARDIOVASCULAR STRESS TEST  05/11/16 ([facility])   Myocardial perfusion imaging:  No ischemia; scar in apex, global hypokinesis, EF 36%.  . Carotid dopplers  03/09/2016   [facility]: no hemodynamically significant stenosis on either side.  . CHOLECYSTECTOMY    . COLONOSCOPY    . COLONOSCOPY [name] 10/30/2012   Procedure: COLONOSCOPY;  Surgeon: CGatha Mayer MD;  Location: [facility];  Service: Endoscopy;  Laterality: [name];  . CORONARY ANGIOPLASTY WITH STENT PLACEMENT  03/2016; 04/2017   2017-[facility]: BMS to RCA-pt was placed on Brilinta.  04/2017: DES to RCA.  .Marland KitchenEYE SURGERY Bilateral cataract  . HEMORRHOID SURGERY    . INTRAOCULAR LENS INSERTION Bilateral   . KNEE SURGERY Right   . LEFT AND RIGHT HEART CATHETERIZATION WITH CORONARY ANGIOGRAM [name] 12/24/2012   Procedure: LEFT AND RIGHT HEART CATHETERIZATION WITH CORONARY ANGIOGRAM;  Surgeon: Peter M JMartinique MD;  [facility];  Service: Cardiovascular;  Laterality: [name];  . LEG SURGERY Bilateral    lenghtening   . PACEMAKER PLACEMENT  04/13/2016   2nd deg HB after TAVR, pt had DC MDT PPM placed.  .Marland KitchenSHOULDER ARTHROSCOPY  08/30/2011   Procedure: ARTHROSCOPY SHOULDER;  Surgeon: [name] MD;  [place] [facility]  Service: Orthopedics;  Laterality: Right;  Right Shoulder Arthroscopy, Debridement, and Decompression  . SPINAL CORD STIMULATOR INSERTION [name] 09/10/2015   Procedure: LUMBAR SPINAL CORD STIMULATOR INSERTION;  Surgeon: PClydell Hakim MD;  [facility];  Service: Neurosurgery;  Laterality: [name];  . 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  R big toe surg due to osteoarth  . TONSILLECTOMY    . traeculectomy Left    eye  . TRANSCATHETER AORTIC VALVE REPLACEMENT, TRANSFEMORAL  04/11/2016  . TRANSESOPHAGEAL ECHOCARDIOGRAM  03/09/2016   [facility]: EF 55-60%, PFO seen with bi-directional shunting, no thrombus in appendage.  . TRANSTHORACIC ECHOCARDIOGRAM  01/2015;  01/2016; 05/18/16; 09/18/16, 05/2017, 08/2017   01/2015 No signif change in aortic stenosis (moderate).  01/2016 Severe LVH w/small LV cavity, EF 60-65%, grade I diast dysfxn.  05/2016 (s/p TAVR): EF 50-55%, grd I DD, biopros AV good.  08/2016--EF 50-55%, LV septal motion c/w conduction abnl, grd I DD,mild MS,bioprosth aortic valve well seated, w/trace AR. 05/2017 TTE EF 35%. 08/2017-EF 35%, mod diff hypokin LV, grd I DD, biopros AV good.  .Marland KitchenVITRECTOMY     Past Medical History:  Diagnosis Date  . ASTHMA   . BPH (benign prostatic hypertrophy)   . CAD (coronary artery disease)    Nonobstructive by 12/2012 cath; then 03/2016 he required  BMS to RCA ([facility]).  In-stent restenosis on cath 11/01/16, baloon angioplasty successful--pt to take plavix (no ASA), + eliquis (for PAF).  [name] CATARACT, HX OF   . Chronic combined systolic and diastolic CHF (congestive heart failure) ([place]) 05/2016   Ischemic CM.  [name] Chronic pain syndrome    Lumbar DDD; chronic neuropathic pain (DM); has spinal stimulator and sees pain mgmt MD  . COPD   . Diabetes mellitus type 2 with complications (HCC)    DQQ2W jumped from 5.7% to 6.1% 03/2015---started metformin at that time.  DM 2 dx by fasting gluc criteria 2018.  Has chronic neuropath pain  . Elevated transaminase level 02/2016   Suspect due to fatty liver (documented on u/s 2007). Plan repeat labs 03/2016.  [name] Essential hypertension, benign   . Fatty liver 2007   2007 u/s showed fatty liver with hepatosplenomegaly.  2019 repeat u/s->fatty liver but no cirrhosis or hepatosplenomegaly.  [name] GERD (gastroesophageal reflux disease)   . Glaucoma   . GOUT   . Hallux valgus (acquired)   . HH (hiatus hernia)   . HYPERCHOLESTEROLEMIA-PURE   . Hypogonadism male   . Lumbosacral neuritis   . Lumbosacral spondylosis    Lumbar spinal stenosis with neurogenic claudication--contributes to his chronic pain syndrome  . Normal memory function 08/2014   Neuropsychological testing ([facility]): no cognitive impairment or sign of neurodegenerative disorder.  Likely has adjustment d/o with mixed anxiety/depressed features and may benefit from low dose SNRI.    [name] Normocytic anemia 03/2016   Mild-pt needs ferritin and vit B12 level checked (as of 03/22/16)  . NSTEMI (non-ST elevated myocardial infarction) ([place]) 03/20/2016   BMS to RCA  . Obesity hypoventilation syndrome ([place])   . Obesity, unspecified   . Orthostatic hypotension   . OSA on CPAP    8 cm H2O  . OSTEOARTHRITIS   . Paroxysmal atrial fibrillation ([place]) 2003    (? chronic?) Off anticoag for a while due to falls.  Then apixaban started 12/2014.  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 Ella Bodo)  . Personal history of colonic adenoma 10/30/2012   Diminutive adenoma, consider repeat 2019 per GI  . Primary osteoarthritis of both knees    Bone on bone of medial compartments, + signif patellofemoral arth bilat.--supartz inj series started 09/12/17  . PUD (peptic ulcer disease)   . PULMONARY HYPERTENSION, HX OF   . Secondary male hypogonadism 2017  . 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 Shortness of breath    with exertion: much improved s/p TAVR and treatment for CHF.  . Sick sinus syndrome (HCC)    PPM placed  . Thrombocytopenia ([place]) 2018   HSM on 2007 abd u/s---suspect some mild splenic sequestration chronically.  [name] Unspecified glaucoma(365.9)   . Unspecified hereditary and idiopathic peripheral neuropathy approx age 68   bilat LE's, ? left arm, too.  Feet became progressively numb + left foot pain intermittently.  Pt may be trying a spinal stimulator (as of 05/2015)  . VENOUS INSUFFICIENCY    Being followed by Dr. Sharol Given as of 10/2016 for two R LL venous stasis ulcers/skin tears.  Healed as of 10/30/16 f/u with Dr. Sharol Given.  . VENTRAL HERNIA    BP 135/65 (BP Location: Left Wrist, Patient Position:  Sitting, Cuff Size: Normal)   Pulse 61   Resp 14   Ht 6' (1.829  m)   Wt (!) 329 lb (149.2 kg)   SpO2 97%   BMI 44.62 kg/m   Opioid Risk Score:   Fall Risk Score:  `1  Depression screen PHQ 2/9  Depression screen [facility] 2/9 09/17/2017 08/20/2017 07/18/2017 05/22/2017 02/06/2017 01/25/2017 09/23/2015  Decreased Interest 0 0 0 0 0 0 0  Down, Depressed, Hopeless 0 0 0 0 0 0 0  PHQ - 2 Score 0 0 0 0 0 0 0  Altered sleeping - - - - - - -  Tired, decreased energy - - - - - - -  Change in appetite - - - - - - -  Feeling bad or failure about yourself  - - - - - - -  Trouble concentrating - - - - - - -  Moving slowly or fidgety/restless - - - - - - -  Suicidal thoughts - - - - - - -  PHQ-9 Score - - - - - - -  Some recent data might be hidden    Review of Systems  Constitutional: Positive for diaphoresis.  HENT: Negative.   Eyes: Negative.   Respiratory: Positive for apnea.   Cardiovascular: Negative.   Gastrointestinal: Negative.   Endocrine: Negative.   Musculoskeletal: Negative.   Skin: Positive for rash.  Allergic/Immunologic: Negative.   Neurological: Positive for tremors, weakness and numbness.       Tingling   Hematological: Bruises/bleeds easily.  Psychiatric/Behavioral: Negative.        Objective:   Physical Exam  Constitutional: He is oriented to person, place, and time. He appears well-developed and well-nourished.  HENT:  Head: Normocephalic and atraumatic.  Neck: Normal range of motion. Neck supple.  Musculoskeletal:  Normal Muscle Bulk and Muscle Testing Reveals: Upper Extremities: Right: Decreased ROM: 90 Degrees and Muscle Strength 4/5 Left: S/P Pacer was instructed not to raise his arm above heart level Muscle Strength 5/5 Back without spinal tenderness noted Lower Extremities: Full ROM and Muscle Strength 5/5 Arises from chair slowly using cane for support Narrow Based Gait   Neurological: He is alert and oriented to person, place, and time.  Skin: Skin  is warm and dry.  Psychiatric: He has a normal mood and affect.  Nursing note and vitals reviewed.         Assessment & Plan:  1. Diabetic neuropathy with chronic neuropathic pain: Using Tens Unit: Continue current medication with Gabapentin 800 mg QID. 11/22/2017 No Script Given: Oxycodone 10 mg one tablet every 8 hours as needed , may take an extra tablet when pain is severe #100 and Tramadol 37.5/325mg TID as needed.  We will continue with the slow weaning of Tramadol, he verbalizes understanding, encouraged to continue with Pain Journal.  We will continue the opioid monitoring program, this consists of regular clinic visits, examinations, urine drug screen, pill counts as well as use of [place] Controlled Substance Reporting System. 2. DDD lumbar spine with LBP: Continue to increase activity as tolerated and heat therapy. 11/22/2017 S/P Permanent Spinal Stimulator Placed 09/10/15 via Dr. Maryjean Ka. 3. Bilateral Chronic Knee Pain: Orthopedist Following.Continue to Monitor. 11/22/2017.  20 minutes of face to face patient care time was spent during this visit. All questions were encouraged and answered.  F/U in 1 month

## 2017-11-28 ENCOUNTER — Encounter: Payer: Self-pay | Admitting: Family Medicine

## 2017-11-28 ENCOUNTER — Ambulatory Visit: Payer: Medicare Other | Admitting: Family Medicine

## 2017-11-28 VITALS — BP 96/61 | HR 61 | Temp 99.0°F | Resp 16 | Ht 72.0 in | Wt 325.1 lb

## 2017-11-28 DIAGNOSIS — I10 Essential (primary) hypertension: Secondary | ICD-10-CM

## 2017-11-28 DIAGNOSIS — E118 Type 2 diabetes mellitus with unspecified complications: Secondary | ICD-10-CM | POA: Diagnosis not present

## 2017-11-28 DIAGNOSIS — R5382 Chronic fatigue, unspecified: Secondary | ICD-10-CM

## 2017-11-28 DIAGNOSIS — I5042 Chronic combined systolic (congestive) and diastolic (congestive) heart failure: Secondary | ICD-10-CM

## 2017-11-28 DIAGNOSIS — D693 Immune thrombocytopenic purpura: Secondary | ICD-10-CM

## 2017-11-28 DIAGNOSIS — Z7901 Long term (current) use of anticoagulants: Secondary | ICD-10-CM | POA: Diagnosis not present

## 2017-11-28 LAB — BASIC METABOLIC PANEL
BUN: 28 mg/dL — AB (ref 6–23)
CALCIUM: 9 mg/dL (ref 8.4–10.5)
CO2: 31 mEq/L (ref 19–32)
CREATININE: 0.9 mg/dL (ref 0.40–1.50)
Chloride: 102 mEq/L (ref 96–112)
GFR: 86.74 mL/min (ref >60.00)
GLUCOSE: 140 mg/dL — AB (ref 70–99)
Potassium: 4.2 mEq/L (ref 3.5–5.1)
Sodium: 140 mEq/L (ref 135–145)

## 2017-11-28 LAB — CBC
HCT: 36.5 % — ABNORMAL LOW (ref 39.0–52.0)
HEMOGLOBIN: 12.4 g/dL — AB (ref 13.0–17.0)
MCHC: 33.9 g/dL (ref 30.0–36.0)
MCV: 93.2 fl (ref 78.0–100.0)
Platelets: 116 10*3/uL — ABNORMAL LOW (ref 150.0–400.0)
RBC: 3.92 Mil/uL — ABNORMAL LOW (ref 4.22–5.81)
RDW: 14.4 % (ref 11.5–15.5)
WBC: 7.4 10*3/uL (ref 4.0–10.5)

## 2017-11-28 LAB — HEMOGLOBIN A1C: Hgb A1c MFr Bld: 5.7 % (ref 4.6–6.5)

## 2017-11-28 LAB — CK: CK TOTAL: 30 U/L (ref 7–232)

## 2017-11-28 NOTE — Progress Notes (Signed)
OFFICE VISIT  11/28/2017   CC:  Chief Complaint  Patient presents with  . Follow-up    RCI, pt is not fasting.   HPI:    Patient is a 77 y.o. Caucasian male who presents for 3 mo f/u DM 2, HTN, HLD.  Of note, he has CAD with hx of MI -->with chronic combined syst and diast HF, has Biv pacer/IC for low EF and sick sinus syndrome, is on eliquis for PAF--->followed by [facility] cardiology.  He has cardiology f/u 12/31/17. Also has COPD, obesity hypoventilation syndrome, and OSA.   He is chronically fatigued due to these dx's, polypharmacy, and morbid obesity/deconditioning. He is using his CPAP.  He has to get up q2h at night to urinate.  Chronic neuropathic pain has him up all night unable to sleep until about 8 AM.  Denies depressed mood.  DM 2: eats decent diabetic diet.  No home glucose monitoring.  Compliant with metformin.  HTN: usually 100-120s over 8s.  HR usually 60. Rare dizziness (brief) upon going from sitting to standing position.  No recent falls.  HLD: taking 60m rosuva and 136mzetia.    ROS: no fevers, no cough.  No CP.  Eating and drinking fine.  No melena or hematochezia or nose bleeds or gross hematuria. LL swelling "pretty good".  Past Medical History:  Diagnosis Date  . ASTHMA   . BPH (benign prostatic hypertrophy)   . CAD (coronary artery disease)    Nonobstructive by 12/2012 cath; then 03/2016 he required BMS to RCA ([facility]).  In-stent restenosis on cath 11/01/16, baloon angioplasty successful--pt to take plavix (no ASA), + eliquis (for PAF).  . Marland KitchenATARACT, HX OF   . Chronic combined systolic and diastolic CHF (congestive heart failure) (HC[facility]11/2017   Ischemic CM.  . Marland Kitchenhronic pain syndrome    Lumbar DDD; chronic neuropathic pain (DM); has spinal stimulator and sees pain mgmt MD  . COPD   . Diabetes mellitus type 2 with complications (HCC)    HbSPQ3Rumped from 5.7% to 6.1% 03/2015---started metformin at that time.  DM 2 dx by fasting gluc criteria 2018.   Has chronic neuropath pain  . Elevated transaminase level 02/2016   Suspect due to fatty liver (documented on u/s 2007). Plan repeat labs 03/2016.  . Marland Kitchenssential hypertension, benign   . Fatty liver 2007   2007 u/s showed fatty liver with hepatosplenomegaly.  2019 repeat u/s->fatty liver but no cirrhosis or hepatosplenomegaly.  . Marland KitchenERD (gastroesophageal reflux disease)   . Glaucoma   . GOUT   . Hallux valgus (acquired)   . HH (hiatus hernia)   . HYPERCHOLESTEROLEMIA-PURE   . Hypogonadism male   . Lumbosacral neuritis   . Lumbosacral spondylosis    Lumbar spinal stenosis with neurogenic claudication--contributes to his chronic pain syndrome  . Normal memory function 08/2014   Neuropsychological testing ([facility]): no cognitive impairment or sign of neurodegenerative disorder.  Likely has adjustment d/o with mixed anxiety/depressed features and may benefit from low dose SNRI.    . Marland Kitchenormocytic anemia 03/2016   Mild-pt needs ferritin and vit B12 level checked (as of 03/22/16)  . NSTEMI (non-ST elevated myocardial infarction) ([facility]CK[facility]09/18/2017   BMS to RCA  . Obesity hypoventilation syndrome (HCD[place]  . Obesity, unspecified   . Orthostatic hypotension   . OSA on CPAP    8 cm H2O  . OSTEOARTHRITIS   . Paroxysmal atrial fibrillation (HCAbbeville2003    (? chronic?) Off anticoag for a while  due to falls.  Then apixaban started 12/2014.  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 Ella Bodo)  . Personal history of colonic adenoma 10/30/2012   Diminutive adenoma, consider repeat 2019 per GI  . Primary osteoarthritis of both knees    Bone on bone of medial compartments, + signif patellofemoral arth bilat.--supartz inj series started 09/12/17  . PUD (peptic ulcer disease)   . PULMONARY HYPERTENSION, HX OF   . Secondary male hypogonadism 2017  . 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  Shortness of breath    with exertion: much improved s/p TAVR and treatment for CHF.  . Sick sinus syndrome (HCC)    PPM placed  . Thrombocytopenia ([place]) 2018   HSM on 2007 abd u/s---suspect some mild splenic sequestration chronically.  [name] Unspecified glaucoma(365.9)   . Unspecified hereditary and idiopathic peripheral neuropathy approx age 25   bilat LE's, ? left arm, too.  Feet became progressively numb + left foot pain intermittently.  Pt may be trying a spinal stimulator (as of 05/2015)  . VENOUS INSUFFICIENCY    Being followed by Dr. Sharol Given as of 10/2016 for two R LL venous stasis ulcers/skin tears.  Healed as of 10/30/16 f/u with Dr. Sharol Given.  . VENTRAL HERNIA     Past Surgical History:  Procedure Laterality Date  . AMPUTATION Left 04/11/2013   Procedure: AMPUTATION DIGIT Left 3rd toe;  Surgeon: [name], MD;  Location: [place];  Service: Orthopedics;  Laterality: Left;  Left 3rd toe amputation at [facility]  . CARDIAC CATHETERIZATION  1997; 03/10/16   1997 Non-obstructive disease.  03/2016 BMS to RCA, with 25% pDiag dz, o/w normal cors per cath 03/07/16.  Cath 11/01/16: in stent restenosis, successful baloon angioplasty.  [name] CARDIAC CATHETERIZATION  12/24/2012   mild < 20% LCx, prox 30% RCA; LVEF 55-65% , moderate pulmonary HTN, moderate AS  . CARDIOVASCULAR STRESS TEST  05/11/16 ([facility])   Myocardial perfusion imaging:  No ischemia; scar in apex, global hypokinesis, EF 36%.  . Carotid dopplers  03/09/2016   [facility]: no hemodynamically significant stenosis on either side.  . CHOLECYSTECTOMY    . COLONOSCOPY    . COLONOSCOPY [name] 10/30/2012   Procedure: COLONOSCOPY;  Surgeon: [name], MD;  Location: [facility];  Service: Endoscopy;  Laterality: [name];  . CORONARY ANGIOPLASTY WITH STENT PLACEMENT  03/2016; 04/2017   2017-[facility]: BMS to RCA-pt was placed on Brilinta.  04/2017: DES to RCA.  [name] EYE SURGERY Bilateral cataract  . HEMORRHOID SURGERY    . INTRAOCULAR LENS INSERTION Bilateral   . KNEE  SURGERY Right   . LEFT AND RIGHT HEART CATHETERIZATION WITH CORONARY ANGIOGRAM [name] 12/24/2012   Procedure: LEFT AND RIGHT HEART CATHETERIZATION WITH CORONARY ANGIOGRAM;  Surgeon: [name], MD;  Location: [facility] CATH LAB;  Service: Cardiovascular;  Laterality: [name];  . LEG SURGERY Bilateral    lenghtening   . PACEMAKER PLACEMENT  04/13/2016   2nd deg HB after TAVR, pt had DC MDT PPM placed.  [name] SHOULDER ARTHROSCOPY  08/30/2011   Procedure: ARTHROSCOPY SHOULDER;  Surgeon: [name], MD;  Location: [place];  Service: Orthopedics;  Laterality: Right;  Right Shoulder Arthroscopy, Debridement, and Decompression  . SPINAL CORD STIMULATOR INSERTION [name] 09/10/2015   Procedure: LUMBAR SPINAL CORD STIMULATOR INSERTION;  Surgeon: [name], MD;  Location: [place] NEURO ORS;  Service: Neurosurgery;  Laterality: [name];  .  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  R big toe surg due to osteoarth  . TONSILLECTOMY    . traeculectomy Left    eye  . TRANSCATHETER AORTIC VALVE REPLACEMENT, TRANSFEMORAL  04/11/2016  . TRANSESOPHAGEAL ECHOCARDIOGRAM  03/09/2016   [facility]: EF 55-60%, PFO seen with bi-directional shunting, no thrombus in appendage.  . TRANSTHORACIC ECHOCARDIOGRAM  01/2015; 01/2016; 05/18/16; 09/18/16, 05/2017, 08/2017   01/2015 No signif change in aortic stenosis (moderate).  01/2016 Severe LVH w/small LV cavity, EF 60-65%, grade I diast dysfxn.  05/2016 (s/p TAVR): EF 50-55%, grd I DD, biopros AV good.  08/2016--EF 50-55%, LV septal motion c/w conduction abnl, grd I DD,mild MS,bioprosth aortic valve well seated, w/trace AR. 05/2017 TTE EF 35%. 08/2017-EF 35%, mod diff hypokin LV, grd I DD, biopros AV good.  [name] VITRECTOMY      Outpatient Medications Prior to Visit  Medication Sig Dispense Refill  . albuterol (PROVENTIL HFA;VENTOLIN HFA) 108 (90 BASE) MCG/ACT inhaler Inhale 2 puffs into the lungs 4 (four) times daily as needed for wheezing or shortness of breath.    . allopurinol (ZYLOPRIM) 300 MG tablet  TAKE 1 TABLET BY MOUTH ONCE DAILY WITH FOOD 30 tablet 0  . apixaban (ELIQUIS) 5 MG TABS tablet Take 1 tablet (5 mg total) by mouth 2 (two) times daily. 60 tablet 0  . aspirin EC 81 MG tablet Take 1 tablet by mouth daily.    . B Complex-C (B-COMPLEX WITH VITAMIN C) tablet Take 1 tablet by mouth daily.    . cadexomer iodine (IODOSORB) 0.9 % gel Apply 1 application topically daily as needed for wound care. 10 g 3  . clotrimazole-betamethasone (LOTRISONE) cream apply to affected area twice a day 45 g 5  . ezetimibe (ZETIA) 10 MG tablet take 1 tablet by mouth once daily 90 tablet 3  . finasteride (PROSCAR) 5 MG tablet TAKE 1 TABLET BY MOUTH ONCE DAILY 90 tablet 1  . fluticasone (FLONASE) 50 MCG/ACT nasal spray Place 2 sprays into both nostrils as needed for allergies or rhinitis.    . Fluticasone-Salmeterol (ADVAIR) 250-50 MCG/DOSE AEPB Inhale 1 puff into the lungs daily as needed (for nasal congestion).     . gabapentin (NEURONTIN) 800 MG tablet Take 1 tablet (800 mg total) by mouth 4 (four) times daily. 360 tablet 2  . metFORMIN (GLUCOPHAGE) 500 MG tablet TAKE 1 TABLET BY MOUTH TWICE DAILY WITH FOOD 60 tablet 5  . metoprolol succinate (TOPROL-XL) 25 MG 24 hr tablet Take 1 tablet by mouth daily. Along w/ 37m to equal 730mdaily    . metoprolol succinate (TOPROL-XL) 50 MG 24 hr tablet Take 50 mg by mouth daily. Take with or immediately following a meal.    . Multiple Vitamin (MULTIVITAMIN) capsule Take 1 capsule by mouth daily.    . mupirocin ointment (BACTROBAN) 2 % Apply 1 application topically 2 (two) times daily. Apply to the affected area 2 times a day 22 g 3  . niacin (NIASPAN) 1000 MG CR tablet TAKE 1 TABLET BY MOUTH TWICE DAILY 180 tablet 1  . nitroGLYCERIN (NITROSTAT) 0.4 MG SL tablet     . omeprazole (PRILOSEC) 10 MG capsule TAKE 1 CAPSULE BY MOUTH ONCE DAILY 30 capsule 5  . Oxycodone HCl 10 MG TABS Take 1 tablet (10 mg total) by mouth every 8 (eight) hours as needed. May take an extra  tablet when pain is severe 100 tablet 0  . potassium chloride SA (K-DUR,KLOR-CON) 20 MEQ tablet Take  1 tablet (20 mEq total) by mouth daily. 90 tablet 1  . rosuvastatin (CRESTOR) 40 MG tablet take 1 tablet by mouth once daily 15 tablet 0  . sacubitril-valsartan (ENTRESTO) 24-26 MG Take 1 tablet by mouth 2 (two) times daily.    [name] torsemide (DEMADEX) 20 MG tablet TAKE 1 TABLET BY MOUTH ONCE DAILY (Patient taking differently: TAKE 2 TABLETS  BY MOUTH ONCE DAILY) 30 tablet 5  . traMADol-acetaminophen (ULTRACET) 37.5-325 MG tablet Take 1 tablet by mouth 3 (three) times daily as needed. 90 tablet 3  . VITAMIN D, CHOLECALCIFEROL, PO Take 1 tablet by mouth daily.      No facility-administered medications prior to visit.     Allergies  Allergen Reactions  . Brimonidine Tartrate Shortness Of Breath    Alphagan-Shortness of breath  . Brimonidine Tartrate Palpitations  . Brinzolamide Shortness Of Breath    AZOPT- Shortness of breath  . Latanoprost Shortness Of Breath    XALATAN- Shortness of breath  . Nucynta [Tapentadol] Shortness Of Breath  . Sulfa Antibiotics Palpitations  . Timolol Maleate Shortness Of Breath and Other (See Comments)    TIMOPTIC- Aggravated asthma  . Cardizem  [Diltiazem Hcl] Swelling  . Diltiazem Swelling     leg swelling  . Rofecoxib Swelling     VIOXX- leg swelling  . Vancomycin Hives and Other (See Comments)    Possible "Red Man Syndrome"? > hives/blisters  . Codeine Other (See Comments)    Childhood reaction  . Celecoxib Other (See Comments)    CELLBREX-confusion  . Colchicine Diarrhea    diarrhea  . Tape Rash    ROS As per HPI  PE: Blood pressure 96/61, pulse 61, temperature 99 F (37.2 C), temperature source Oral, resp. rate 16, height 6' (1.829 m), weight (!) 325 lb 2 oz (147.5 kg), SpO2 97 %. Gen: Alert, well appearing.  Patient is oriented to person, place, time, and situation. AFFECT: pleasant but flat affect, lucid thought and speech. CV: RRR,  distant S1 and S2, no audible murmur or rub. Chest is clear, no wheezing or rales. Normal symmetric air entry throughout both lung fields. No chest wall deformities or tenderness. EXT: trace pitting edema.  He has brawny discoloration with fibrotic skin changes in anterior tibial regions bilat.   LABS:   Lab Results  Component Value Date   LABURIC 3.5 (L) 09/10/2013   Lab Results  Component Value Date   TSH 1.67 02/09/2016   Lab Results  Component Value Date   WBC 6.0 08/09/2017   HGB 12.4 (L) 08/09/2017   HCT 36.5 (L) 08/09/2017   MCV 91.4 08/09/2017   PLT 116.0 (L) 08/09/2017   Lab Results  Component Value Date   IRON 59 05/24/2016   IRON 54 05/24/2016   TIBC 334 05/24/2016   FERRITIN 44.8 05/24/2016    Lab Results  Component Value Date   CREATININE 0.82 08/09/2017   BUN 23 08/09/2017   NA 139 08/09/2017   K 4.2 08/09/2017   CL 102 08/09/2017   CO2 29 08/09/2017   Lab Results  Component Value Date   ALT 23 08/09/2017   AST 23 08/09/2017   ALKPHOS 57 08/09/2017   BILITOT 0.6 08/09/2017   Lab Results  Component Value Date   CHOL 108 08/09/2017   Lab Results  Component Value Date   HDL 56.30 08/09/2017   Lab Results  Component Value Date   LDLCALC 39 08/09/2017   Lab Results  Component Value Date  TRIG 63.0 08/09/2017   Lab Results  Component Value Date   CHOLHDL 2 08/09/2017   Lab Results  Component Value Date   PSA 0.23 11/23/2015   PSA 0.33 10/07/2014   PSA 0.40 09/25/2013   Lab Results  Component Value Date   HGBA1C 6.1 08/09/2017    IMPRESSION AND PLAN:  1) Chronic fatigue: deconditioning, chronic poor sleep, multiple cv/pulm comorbidities + chronic pain syndrome, + polypharmacy.  Will monitor CBC, BMET, CPK, and a1c today.  2) DM 2: historically well controlled. HbA1c today.  If A1c still < 6.5%, will have him d/c metformin.  3) HTN: bp low normal, but he is essentially asymptomatic, and he needs to be on the current cv meds  to maximize function of his heart.  4) HLD: tolerating high dose statin + zetia.  Lipid panel 3 mo ago excellent.  Will repeat this in 3 mo.  An After Visit Summary was printed and given to the patient.  FOLLOW UP: Return in about 3 months (around 02/28/2018) for routine chronic illness f/u.  Signed:  Crissie Sickles, MD           11/28/2017

## 2017-12-05 ENCOUNTER — Telehealth: Payer: Self-pay | Admitting: Family Medicine

## 2017-12-05 NOTE — Telephone Encounter (Signed)
Copied from [place] [id number]. Topic: Quick Communication - Rx Refill/Question >> Dec 05, 2017 10:31 AM Boyd Kerbs wrote: Medication:   allopurinol (ZYLOPRIM) 300 MG tablet         Has the patient contacted their pharmacy? No. (Agent: If no, request that the patient contact the pharmacy for the refill.) (Agent: If yes, when and what did the pharmacy advise?)  Preferred Pharmacy (with phone number or street name):  [id number] [address] [place]Phone: [phone]: [phone] a 24 hour pharmacy; exact hours not known    Agent: Please be advised that RX refills may take up to 3 business days. We ask that you follow-up with your pharmacy.

## 2017-12-06 MED ORDER — ALLOPURINOL 300 MG PO TABS: ORAL_TABLET | ORAL | 0 refills | Status: DC

## 2017-12-13 ENCOUNTER — Telehealth: Payer: Self-pay

## 2017-12-13 NOTE — Telephone Encounter (Signed)
Patient called with a message for Zella Ball, called him back and he stated only wanted to speak with Zella Ball and will call back next week when she returns.

## 2017-12-17 NOTE — Telephone Encounter (Signed)
Return Mr. Mapel call, no answer. Left message to return the call.

## 2017-12-20 ENCOUNTER — Other Ambulatory Visit: Payer: Self-pay | Admitting: Family Medicine

## 2017-12-20 ENCOUNTER — Encounter: Payer: Self-pay | Admitting: Registered Nurse

## 2017-12-20 ENCOUNTER — Encounter: Payer: Medicare Other | Attending: Registered Nurse | Admitting: Registered Nurse

## 2017-12-20 VITALS — BP 137/72 | HR 61 | Resp 14 | Ht 72.0 in | Wt 325.0 lb

## 2017-12-20 DIAGNOSIS — Z79891 Long term (current) use of opiate analgesic: Secondary | ICD-10-CM | POA: Diagnosis not present

## 2017-12-20 DIAGNOSIS — G609 Hereditary and idiopathic neuropathy, unspecified: Secondary | ICD-10-CM | POA: Diagnosis not present

## 2017-12-20 DIAGNOSIS — G8929 Other chronic pain: Secondary | ICD-10-CM | POA: Diagnosis not present

## 2017-12-20 DIAGNOSIS — J449 Chronic obstructive pulmonary disease, unspecified: Secondary | ICD-10-CM | POA: Insufficient documentation

## 2017-12-20 DIAGNOSIS — M109 Gout, unspecified: Secondary | ICD-10-CM | POA: Insufficient documentation

## 2017-12-20 DIAGNOSIS — M79672 Pain in left foot: Secondary | ICD-10-CM | POA: Diagnosis present

## 2017-12-20 DIAGNOSIS — E7800 Pure hypercholesterolemia, unspecified: Secondary | ICD-10-CM | POA: Insufficient documentation

## 2017-12-20 DIAGNOSIS — M792 Neuralgia and neuritis, unspecified: Secondary | ICD-10-CM

## 2017-12-20 DIAGNOSIS — I2510 Atherosclerotic heart disease of native coronary artery without angina pectoris: Secondary | ICD-10-CM | POA: Diagnosis not present

## 2017-12-20 DIAGNOSIS — M48062 Spinal stenosis, lumbar region with neurogenic claudication: Secondary | ICD-10-CM | POA: Diagnosis not present

## 2017-12-20 DIAGNOSIS — K219 Gastro-esophageal reflux disease without esophagitis: Secondary | ICD-10-CM | POA: Insufficient documentation

## 2017-12-20 DIAGNOSIS — M25562 Pain in left knee: Secondary | ICD-10-CM

## 2017-12-20 DIAGNOSIS — M1990 Unspecified osteoarthritis, unspecified site: Secondary | ICD-10-CM | POA: Diagnosis not present

## 2017-12-20 DIAGNOSIS — G894 Chronic pain syndrome: Secondary | ICD-10-CM | POA: Diagnosis not present

## 2017-12-20 DIAGNOSIS — Z5181 Encounter for therapeutic drug level monitoring: Secondary | ICD-10-CM

## 2017-12-20 DIAGNOSIS — M25561 Pain in right knee: Secondary | ICD-10-CM

## 2017-12-20 MED ORDER — OXYCODONE HCL 10 MG PO TABS: 10.0000 mg | ORAL_TABLET | Freq: Three times a day (TID) | ORAL | 0 refills | Status: DC | PRN

## 2017-12-20 MED ORDER — NIACIN ER (ANTIHYPERLIPIDEMIC) 1000 MG PO TBCR: 1000.0000 mg | EXTENDED_RELEASE_TABLET | Freq: Two times a day (BID) | ORAL | 1 refills | Status: DC

## 2017-12-20 NOTE — Telephone Encounter (Signed)
LOV  11/28/17 Dr. Anitra Lauth Last refill  06/04/17

## 2017-12-20 NOTE — Progress Notes (Signed)
Subjective:    Patient ID: Tim [name], male    DOB: 04/27/1941, 77 y.o.   MRN: [id number]  HPI: Tim [name] is a 77 year old male who returns for follow up appointment for chronic pain and medication refill. He states his pain is located in his lower back and left foot pain with tingling and numbness.Tim [name] reports his neuropathic pain increases in intensity at night. In the past he was prescribed Nucynta had to be discontinued due to cost.  He rates his pain 3. His current exercise regime is walking.   Tim [name] Morphine Equivalent 61.25 MME. Last UDS was Performed on 09/17/2017, it was consistent.    Pain Inventory Average Pain 4 Pain Right Now 3 My pain is sharp, burning, dull, stabbing, tingling and aching  In the last 24 hours, has pain interfered with the following? General activity 3 Relation with others 9 Enjoyment of life 3 What TIME of day is your pain at its worst? night Sleep (in general) Poor  Pain is worse with: walking, bending, standing and some activites Pain improves with: rest, pacing activities and medication Relief from Meds: 8  Mobility walk with assistance use a cane ability to climb steps?  no do you drive?  yes  Function retired I need assistance with the following:  household duties and shopping  Neuro/Psych weakness numbness tremor trouble walking  Prior Studies Any changes since last visit?  no  Physicians involved in your care Any changes since last visit?  no   Family History  Problem Relation Age of Onset  . Hypertension Mother   . Coronary artery disease Mother   . Heart attack Mother   . Neuropathy Mother   . Pulmonary fibrosis Father        asbestosis   Social History   Socioeconomic History  . Marital status: Married    Spouse name: Not on file  . Number of children: 0  . Years of education: 64  . Highest education level: Not on file  Occupational History  . Occupation: Engineer, production    Comment:  retired  Scientific laboratory technician  . Financial resource strain: Not on file  . Food insecurity:    Worry: Not on file    Inability: Not on file  . Transportation needs:    Medical: Not on file    Non-medical: Not on file  Tobacco Use  . Smoking status: Never Smoker  . Smokeless tobacco: Never Used  Substance and Sexual Activity  . Alcohol use: No    Alcohol/week: 0.0 oz  . Drug use: No    Types: Oxycodone  . Sexual activity: Not Currently  Lifestyle  . Physical activity:    Days per week: Not on file    Minutes per session: Not on file  . Stress: Not on file  Relationships  . Social connections:    Talks on phone: Not on file    Gets together: Not on file    Attends religious service: Not on file    Active member of club or organization: Not on file    Attends meetings of clubs or organizations: Not on file    Relationship status: Not on file  Other Topics Concern  . Not on file  Social History Narrative   HSG, [name] - BS, [name] - MS-engineering, 2 years on PhD - [place]. Married - '65 - 35yr/divorced; '76- 3 yrs/divorced; '92 . No children. 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 ACP/Living Will - Yes CPR; long-term Mechanical ventilation as long as he was able to cognate; ok for long term artificial nutrition. Precondition being able to cognate and not to have too much pain.    Past Surgical History:  Procedure Laterality Date  . AMPUTATION Left 04/11/2013   Procedure: AMPUTATION DIGIT Left 3rd toe;  Surgeon: [name], MD;  Location: [facility];  Service: Orthopedics;  Laterality: Left;  Left 3rd toe amputation at [facility]  . CARDIAC CATHETERIZATION  1997; 03/10/16   1997 Non-obstructive disease.  03/2016 BMS to RCA, with 25% pDiag dz, o/w normal cors per cath 03/07/16.  Cath 11/01/16: in stent restenosis, successful baloon angioplasty.  [name] CARDIAC CATHETERIZATION  12/24/2012   mild < 20% LCx, prox 30% RCA; LVEF 55-65% , moderate pulmonary HTN, moderate AS    . CARDIOVASCULAR STRESS TEST  05/11/16 ([facility])   Myocardial perfusion imaging:  No ischemia; scar in apex, global hypokinesis, EF 36%.  . Carotid dopplers  03/09/2016   [facility]: no hemodynamically significant stenosis on either side.  . CHOLECYSTECTOMY    . COLONOSCOPY    . COLONOSCOPY [name] 10/30/2012   Procedure: COLONOSCOPY;  Surgeon: [name], MD;  Location: [facility];  Service: Endoscopy;  Laterality: [name];  . CORONARY ANGIOPLASTY WITH STENT PLACEMENT  03/2016; 04/2017   2017-[facility]: BMS to RCA-pt was placed on Brilinta.  04/2017: DES to RCA.  [name] EYE SURGERY Bilateral cataract  . HEMORRHOID SURGERY    . INTRAOCULAR LENS INSERTION Bilateral   . KNEE SURGERY Right   . LEFT AND RIGHT HEART CATHETERIZATION WITH CORONARY ANGIOGRAM [name] 12/24/2012   Procedure: LEFT AND RIGHT HEART CATHETERIZATION WITH CORONARY ANGIOGRAM;  Surgeon: [name], MD;  Location: [facility] CATH LAB;  Service: Cardiovascular;  Laterality: [name];  . LEG SURGERY Bilateral    lenghtening   . PACEMAKER PLACEMENT  04/13/2016   2nd deg HB after TAVR, pt had DC MDT PPM placed.  [name] SHOULDER ARTHROSCOPY  08/30/2011   Procedure: ARTHROSCOPY SHOULDER;  Surgeon: [name], MD;  Location: [facility];  Service: Orthopedics;  Laterality: Right;  Right Shoulder Arthroscopy, Debridement, and Decompression  . SPINAL CORD STIMULATOR INSERTION [name] 09/10/2015   Procedure: LUMBAR SPINAL CORD STIMULATOR INSERTION;  Surgeon: [name], MD;  Location: [place] [facility]UR[facility];  Service: Neurosurgery;  Laterality: [name];  . 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  R big toe surg due to osteoarth  . TONSILLECTOMY    . traeculectomy Left    eye  . TRANSCATHETER AORTIC VALVE REPLACEMENT, TRANSFEMORAL  04/11/2016  . TRANSESOPHAGEAL ECHOCARDIOGRAM  03/09/2016   [facility]: EF 55-60%, PFO seen with bi-directional shunting, no thrombus in appendage.  . TRANSTHORACIC ECHOCARDIOGRAM  01/2015; 01/2016; 05/18/16; 09/18/16, 05/2017, 08/2017   01/2015 No signif  change in aortic stenosis (moderate).  01/2016 Severe LVH w/small LV cavity, EF 60-65%, grade I diast dysfxn.  05/2016 (s/p TAVR): EF 50-55%, grd I DD, biopros AV good.  08/2016--EF 50-55%, LV septal motion c/w conduction abnl, grd I DD,mild MS,bioprosth aortic valve well seated, w/trace AR. 05/2017 TTE EF 35%. 08/2017-EF 35%, mod diff hypokin LV, grd I DD, biopros AV good.  [name] VITRECTOMY     Past Medical History:  Diagnosis Date  . ASTHMA   . BPH (benign prostatic hypertrophy)   . CAD (coronary artery disease)    Nonobstructive by 12/2012 cath; then 03/2016 he required BMS to RCA ([facility]).  In-stent restenosis on cath 11/01/16, baloon angioplasty successful--pt  to take plavix (no ASA), + eliquis (for PAF).  [name] CATARACT, HX OF   . Chronic combined systolic and diastolic CHF (congestive heart failure) ([place]) 05/2016   Ischemic CM.  [name] Chronic pain syndrome    Lumbar DDD; chronic neuropathic pain (DM); has spinal stimulator and sees pain mgmt MD  . COPD   . Diabetes mellitus type 2 with complications (HCC)    YWV3X jumped from 5.7% to 6.1% 03/2015---started metformin at that time.  DM 2 dx by fasting gluc criteria 2018.  Has chronic neuropath pain  . Elevated transaminase level 02/2016   Suspect due to fatty liver (documented on u/s 2007). Plan repeat labs 03/2016.  [name] Essential hypertension, benign   . Fatty liver 2007   2007 u/s showed fatty liver with hepatosplenomegaly.  2019 repeat u/s->fatty liver but no cirrhosis or hepatosplenomegaly.  [name] GERD (gastroesophageal reflux disease)   . Glaucoma   . GOUT   . Hallux valgus (acquired)   . HH (hiatus hernia)   . HYPERCHOLESTEROLEMIA-PURE   . Hypogonadism male   . Lumbosacral neuritis   . Lumbosacral spondylosis    Lumbar spinal stenosis with neurogenic claudication--contributes to his chronic pain syndrome  . Normal memory function 08/2014   Neuropsychological testing ([facility]): no cognitive impairment or sign of neurodegenerative  disorder.  Likely has adjustment d/o with mixed anxiety/depressed features and may benefit from low dose SNRI.    [name] Normocytic anemia 03/2016   Mild-pt needs ferritin and vit B12 level checked (as of 03/22/16)  . NSTEMI (non-ST elevated myocardial infarction) ([place]) 03/20/2016   BMS to RCA  . Obesity hypoventilation syndrome ([place])   . Obesity, unspecified   . Orthostatic hypotension   . OSA on CPAP    8 cm H2O  . OSTEOARTHRITIS   . Paroxysmal atrial fibrillation ([place]) 2003    (? chronic?) Off anticoag for a while due to falls.  Then apixaban started 12/2014.  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 Ella Bodo)  . Personal history of colonic adenoma 10/30/2012   Diminutive adenoma, consider repeat 2019 per GI  . Primary osteoarthritis of both knees    Bone on bone of medial compartments, + signif patellofemoral arth bilat.--supartz inj series started 09/12/17  . PUD (peptic ulcer disease)   . PULMONARY HYPERTENSION, HX OF   . Secondary male hypogonadism 2017  . 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 Shortness of breath    with exertion: much improved s/p TAVR and treatment for CHF.  . Sick sinus syndrome (HCC)    PPM placed  . Thrombocytopenia ([place]) 2018   HSM on 2007 abd u/s---suspect some mild splenic sequestration chronically.  [name] Unspecified glaucoma(365.9)   . Unspecified hereditary and idiopathic peripheral neuropathy approx age 55   bilat LE's, ? left arm, too.  Feet became progressively numb + left foot pain intermittently.  Pt may be trying a spinal stimulator (as of 05/2015)  . VENOUS INSUFFICIENCY    Being followed by Dr. Sharol Given as of 10/2016 for two R LL venous stasis ulcers/skin tears.  Healed as of 10/30/16 f/u with Dr. Sharol Given.  . VENTRAL HERNIA    BP 137/72 (BP Location: Left Wrist, Patient Position: Sitting, Cuff Size: Large)   Pulse 61   Resp 14   Ht 6' (1.829 m)   Wt  (!) 325 lb (147.4 kg)   SpO2 96%  BMI 44.08 kg/m   Opioid Risk Score:   Fall Risk Score:  `1  Depression screen PHQ 2/9  Depression screen [facility] 2/9 09/17/2017 08/20/2017 07/18/2017 05/22/2017 02/06/2017 01/25/2017 09/23/2015  Decreased Interest 0 0 0 0 0 0 0  Down, Depressed, Hopeless 0 0 0 0 0 0 0  PHQ - 2 Score 0 0 0 0 0 0 0  Altered sleeping - - - - - - -  Tired, decreased energy - - - - - - -  Change in appetite - - - - - - -  Feeling bad or failure about yourself  - - - - - - -  Trouble concentrating - - - - - - -  Moving slowly or fidgety/restless - - - - - - -  Suicidal thoughts - - - - - - -  PHQ-9 Score - - - - - - -  Some recent data might be hidden    Review of Systems  Constitutional: Negative.   HENT: Negative.   Eyes: Negative.   Respiratory: Positive for apnea.   Cardiovascular: Positive for leg swelling.  Gastrointestinal: Positive for constipation.  Endocrine: Negative.   Musculoskeletal: Positive for back pain and gait problem.  Skin: Positive for rash.  Allergic/Immunologic: Negative.   Neurological: Positive for tremors, weakness and numbness.  Hematological: Bruises/bleeds easily.  All other systems reviewed and are negative.      Objective:   Physical Exam  Constitutional: He is oriented to person, place, and time. He appears well-developed and well-nourished.  HENT:  Head: Normocephalic and atraumatic.  Neck: Normal range of motion. Neck supple.  Cervical Paraspinal Tenderness: C-5-C-6  Cardiovascular: Normal rate and regular rhythm.  Pulmonary/Chest: Effort normal and breath sounds normal.  Musculoskeletal:  Normal Muscle Bulk and Muscle Testing Reveals: Upper Extremities: Right: Decreased ROM 45 Degrees and Muscle Strength 5/5 Left: Full ROM and Muscle Strength 5.5 Thoracic Paraspinal Tenderness: T-1-T-1 Lower Extremities: Full ROM and Muscle Strength 5/5 Arises from Table with ease Narrow Based gait   Neurological: He is alert and  oriented to person, place, and time.  Skin: Skin is warm and dry.  Psychiatric: He has a normal mood and affect.  Nursing note and vitals reviewed.         Assessment & Plan:  1. Diabetic neuropathy with chronic neuropathic pain: Using Tens Unit: Continuecurrent medication withGabapentin 800 mg QID. 12/20/2017 Refilled: Oxycodone 10 mg one tablet every 8 hours as needed , may take an extra tablet when pain is severe #100 and Tramadol 37.5/325mg TID as needed.  We will continue with the slow weaning of Tramadol, he verbalizes understanding, encouraged to continue with Pain Journal.  We will continue the opioid monitoring program, this consists of regular clinic visits, examinations, urine drug screen, pill counts as well as use of [place] Controlled Substance Reporting System. 2. DDD lumbar spine with LBP: Continue to increase activity as tolerated and heat therapy. 12/20/2017 S/P Permanent Spinal Stimulator Placed 09/10/15 via Dr. Maryjean Ka. 3. Bilateral Chronic Knee Pain: Orthopedist Following.Continue to Monitor. 12/20/2017.  20 minutes of face to face patient care time was spent during this visit. All questions were encouraged and answered.  F/U in 1 month

## 2017-12-20 NOTE — Telephone Encounter (Signed)
Copied from [place] [id number]. Topic: Quick Communication - Rx Refill/Question >> Dec 20, 2017  8:33 AM Alfredia Ferguson R wrote: Medication: niacin (NIASPAN) 1000 MG CR tablet   Has the patient contacted their pharmacy?Yes (Agent: If no, request that the patient contact the pharmacy for the refill.) (Agent: If yes, when and what did the pharmacy advise?)  Preferred Pharmacy (with phone number or street name): [id number] [id number]: [phone]: [phone]   Agent: Please be advised that RX refills may take up to 3 business days. We ask that you follow-up with your pharmacy.

## 2018-01-03 ENCOUNTER — Other Ambulatory Visit: Payer: Self-pay | Admitting: Family Medicine

## 2018-01-09 NOTE — Telephone Encounter (Signed)
Erx'd this med just now.

## 2018-01-11 ENCOUNTER — Telehealth: Payer: Self-pay | Admitting: Family Medicine

## 2018-01-11 NOTE — Telephone Encounter (Signed)
Call to patient- he states he actually has 2 refills on his bottle- he is going to talk to pharmacy. I am going to forward a message to the office because this medication is not on his list and it does need to be on the list. *Patient states Dr Anitra Lauth filled the last Rx- Imdur 60 mg 24 hr tablet once daily. (Needs to be on med list.)*

## 2018-01-11 NOTE — Telephone Encounter (Signed)
Okay to add Imdur back to pts medication list?   Please advise. Thanks.

## 2018-01-11 NOTE — Telephone Encounter (Signed)
Copied from [place] [id number]. Topic: Quick Communication - See Telephone Encounter >> Jan 11, 2018 10:30 AM Mylinda Latina, NT wrote: CRM for notification. See Telephone encounter for: 01/11/18. Patient called and states he needs a refill of his isosorbide mononitrate (IMDUR) 60 MG 24 hr tablet. This med is not on is current medication list   [phone](Phone) [phone](Fax)

## 2018-01-11 NOTE — Telephone Encounter (Signed)
Yes

## 2018-01-11 NOTE — Telephone Encounter (Signed)
Medication has been added to pts med list.

## 2018-01-14 ENCOUNTER — Other Ambulatory Visit: Payer: Self-pay | Admitting: *Deleted

## 2018-01-14 MED ORDER — ISOSORBIDE MONONITRATE ER 60 MG PO TB24: 60.0000 mg | ORAL_TABLET | Freq: Every day | ORAL | 1 refills | Status: DC

## 2018-01-14 NOTE — Telephone Encounter (Signed)
Received RF request for 90 day supply.

## 2018-01-15 ENCOUNTER — Ambulatory Visit (HOSPITAL_BASED_OUTPATIENT_CLINIC_OR_DEPARTMENT_OTHER): Payer: Medicare Other | Admitting: Physical Medicine & Rehabilitation

## 2018-01-15 ENCOUNTER — Other Ambulatory Visit: Payer: Self-pay

## 2018-01-15 ENCOUNTER — Encounter: Payer: Medicare Other | Attending: Registered Nurse

## 2018-01-15 VITALS — BP 134/73 | HR 74 | Ht 72.0 in | Wt 343.4 lb

## 2018-01-15 DIAGNOSIS — M109 Gout, unspecified: Secondary | ICD-10-CM | POA: Insufficient documentation

## 2018-01-15 DIAGNOSIS — J449 Chronic obstructive pulmonary disease, unspecified: Secondary | ICD-10-CM | POA: Insufficient documentation

## 2018-01-15 DIAGNOSIS — I2510 Atherosclerotic heart disease of native coronary artery without angina pectoris: Secondary | ICD-10-CM | POA: Insufficient documentation

## 2018-01-15 DIAGNOSIS — E7800 Pure hypercholesterolemia, unspecified: Secondary | ICD-10-CM | POA: Diagnosis not present

## 2018-01-15 DIAGNOSIS — M79672 Pain in left foot: Secondary | ICD-10-CM | POA: Diagnosis present

## 2018-01-15 DIAGNOSIS — M792 Neuralgia and neuritis, unspecified: Secondary | ICD-10-CM

## 2018-01-15 DIAGNOSIS — M1990 Unspecified osteoarthritis, unspecified site: Secondary | ICD-10-CM | POA: Diagnosis not present

## 2018-01-15 DIAGNOSIS — K219 Gastro-esophageal reflux disease without esophagitis: Secondary | ICD-10-CM | POA: Insufficient documentation

## 2018-01-15 DIAGNOSIS — G609 Hereditary and idiopathic neuropathy, unspecified: Secondary | ICD-10-CM | POA: Diagnosis not present

## 2018-01-15 MED ORDER — OXYCODONE HCL ER 30 MG PO T12A: 30.0000 mg | EXTENDED_RELEASE_TABLET | Freq: Every day | ORAL | 0 refills | Status: DC

## 2018-01-15 NOTE — Patient Instructions (Signed)
Please take tramadol if the oxycontin is not effective

## 2018-01-15 NOTE — Progress Notes (Signed)
Subjective:    Patient ID: Tim [name], male    DOB: 11-05-1940, 77 y.o.   MRN: [id number]  HPI 77 year old male with history of chronic left lower extremity pain.  He has had some improvement after S1 injections but these were short-lived.  In addition he is been diagnosed with peripheral neuropathy.  He is prediabetic according to patient however looking at his chart at one point he was on metformin.  Most recent hemoglobin A1c was 6.1. He has undergone spinal cord stimulation trial which was excellent for relieving his pain however permanent stimulator has given him minimal relief and no longer uses it. He wakes up at night twice to take pain medication.  He states up much of the night because of his left lower extremity pain. He has tried any symptom in the past but did not tolerate from a shortness of breath standpoint.  Also at 100 mg 4 times daily it was not helping his pain much. He continues to take gabapentin 800 mg 4 times per day  Pain Inventory Average Pain 8 Pain Right Now 3 My pain is sharp, burning, dull, stabbing, tingling and aching  In the last 24 hours, has pain interfered with the following? General activity 7 Relation with others 8 Enjoyment of life 3 What TIME of day is your pain at its worst? night Sleep (in general) Poor  Pain is worse with: walking, bending, standing and some activites Pain improves with: rest, pacing activities and medication Relief from Meds: 6  Mobility use a cane how many minutes can you walk? 3 ability to climb steps?  no do you drive?  yes  Function retired I need assistance with the following:  household duties and shopping  Neuro/Psych weakness numbness tremor tingling  Prior Studies Any changes since last visit?  no  Physicians involved in your care Any changes since last visit?  no   Family History  Problem Relation Age of Onset  . Hypertension Mother   . Coronary artery disease Mother   . Heart attack  Mother   . Neuropathy Mother   . Pulmonary fibrosis Father        asbestosis   Social History   Socioeconomic History  . Marital status: Married    Spouse name: Not on file  . Number of children: 0  . Years of education: 26  . Highest education level: Not on file  Occupational History  . Occupation: Engineer, production    Comment: retired  Scientific laboratory technician  . Financial resource strain: Not on file  . Food insecurity:    Worry: Not on file    Inability: Not on file  . Transportation needs:    Medical: Not on file    Non-medical: Not on file  Tobacco Use  . Smoking status: Never Smoker  . Smokeless tobacco: Never Used  Substance and Sexual Activity  . Alcohol use: No    Alcohol/week: 0.0 oz  . Drug use: No    Types: Oxycodone  . Sexual activity: Not Currently  Lifestyle  . Physical activity:    Days per week: Not on file    Minutes per session: Not on file  . Stress: Not on file  Relationships  . Social connections:    Talks on phone: Not on file    Gets together: Not on file    Attends religious service: Not on file    Active member of club or organization: Not on file    Attends meetings of  clubs or organizations: Not on file    Relationship status: Not on file  Other Topics Concern  . Not on file  Social History Narrative   HSG, [name] - BS, [name] - MS-engineering, 2 years on PhD - [place]. Married - '65 - 1yr/divorced; '76- 3 yrs/divorced; '92 . No children. 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 ACP/Living Will - Yes CPR; long-term Mechanical ventilation as long as he was able to cognate; ok for long term artificial nutrition. Precondition being able to cognate and not to have too much pain.    Past Surgical History:  Procedure Laterality Date  . AMPUTATION Left 04/11/2013   Procedure: AMPUTATION DIGIT Left 3rd toe;  Surgeon: [name] MD;  [facility]  Service: Orthopedics;  Laterality: Left;  Left 3rd toe amputation at  [facility]  . CARDIAC CATHETERIZATION  1997; 03/10/16   1997 Non-obstructive disease.  03/2016 BMS to RCA, with 25% pDiag dz, o/w normal cors per cath 03/07/16.  Cath 11/01/16: in stent restenosis, successful baloon angioplasty.  .Marland KitchenCARDIAC CATHETERIZATION  12/24/2012   mild < 20% LCx, prox 30% RCA; LVEF 55-65% , moderate pulmonary HTN, moderate AS  . CARDIOVASCULAR STRESS TEST  05/11/16 ([facility])   Myocardial perfusion imaging:  No ischemia; scar in apex, global hypokinesis, EF 36%.  . Carotid dopplers  03/09/2016   [facility]: no hemodynamically significant stenosis on either side.  . CHOLECYSTECTOMY    . COLONOSCOPY    . COLONOSCOPY [name] 10/30/2012   Procedure: COLONOSCOPY;  Surgeon: CGatha Mayer MD;  Location: [facility];  Service: Endoscopy;  Laterality: [name];  . CORONARY ANGIOPLASTY WITH STENT PLACEMENT  03/2016; 04/2017   2017-[facility]: BMS to RCA-pt was placed on Brilinta.  04/2017: DES to RCA.  .Marland KitchenEYE SURGERY Bilateral cataract  . HEMORRHOID SURGERY    . INTRAOCULAR LENS INSERTION Bilateral   . KNEE SURGERY Right   . LEFT AND RIGHT HEART CATHETERIZATION WITH CORONARY ANGIOGRAM [name] 12/24/2012   Procedure: LEFT AND RIGHT HEART CATHETERIZATION WITH CORONARY ANGIOGRAM;  Surgeon: Peter M JMartinique MD;  [facility];  Service: Cardiovascular;  Laterality: [name];  . LEG SURGERY Bilateral    lenghtening   . PACEMAKER PLACEMENT  04/13/2016   2nd deg HB after TAVR, pt had DC MDT PPM placed.  .Marland KitchenSHOULDER ARTHROSCOPY  08/30/2011   Procedure: ARTHROSCOPY SHOULDER;  Surgeon: [name] MD;  [facility]  Service: Orthopedics;  Laterality: Right;  Right Shoulder Arthroscopy, Debridement, and Decompression  . SPINAL CORD STIMULATOR INSERTION [name] 09/10/2015   Procedure: LUMBAR SPINAL CORD STIMULATOR INSERTION;  Surgeon: PClydell Hakim MD;  [facility];  Service: Neurosurgery;  Laterality: [name];  . 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  R big toe surg due to osteoarth  . TONSILLECTOMY    .  traeculectomy Left    eye  . TRANSCATHETER AORTIC VALVE REPLACEMENT, TRANSFEMORAL  04/11/2016  . TRANSESOPHAGEAL ECHOCARDIOGRAM  03/09/2016   [facility]: EF 55-60%, PFO seen with bi-directional shunting, no thrombus in appendage.  . TRANSTHORACIC ECHOCARDIOGRAM  01/2015; 01/2016; 05/18/16; 09/18/16, 05/2017, 08/2017   01/2015 No signif change in aortic stenosis (moderate).  01/2016 Severe LVH w/small LV cavity, EF 60-65%, grade I diast dysfxn.  05/2016 (s/p TAVR): EF 50-55%, grd I DD, biopros AV good.  08/2016--EF 50-55%, LV septal motion c/w conduction abnl, grd I DD,mild MS,bioprosth aortic valve well seated, w/trace AR. 05/2017 TTE EF 35%. 08/2017-EF 35%, mod diff  hypokin LV, grd I DD, biopros AV good.  [name] VITRECTOMY     Past Medical History:  Diagnosis Date  . ASTHMA   . BPH (benign prostatic hypertrophy)   . CAD (coronary artery disease)    Nonobstructive by 12/2012 cath; then 03/2016 he required BMS to RCA ([facility]).  In-stent restenosis on cath 11/01/16, baloon angioplasty successful--pt to take plavix (no ASA), + eliquis (for PAF).  [name] CATARACT, HX OF   . Chronic combined systolic and diastolic CHF (congestive heart failure) ([place]) 05/2016   Ischemic CM.  [name] Chronic pain syndrome    Lumbar DDD; chronic neuropathic pain (DM); has spinal stimulator and sees pain mgmt MD  . COPD   . Diabetes mellitus type 2 with complications (HCC)    QJF3L jumped from 5.7% to 6.1% 03/2015---started metformin at that time.  DM 2 dx by fasting gluc criteria 2018.  Has chronic neuropath pain  . Elevated transaminase level 02/2016   Suspect due to fatty liver (documented on u/s 2007). Plan repeat labs 03/2016.  [name] Essential hypertension, benign   . Fatty liver 2007   2007 u/s showed fatty liver with hepatosplenomegaly.  2019 repeat u/s->fatty liver but no cirrhosis or hepatosplenomegaly.  [name] GERD (gastroesophageal reflux disease)   . Glaucoma   . GOUT   . Hallux valgus (acquired)   . HH (hiatus hernia)   .  HYPERCHOLESTEROLEMIA-PURE   . Hypogonadism male   . Lumbosacral neuritis   . Lumbosacral spondylosis    Lumbar spinal stenosis with neurogenic claudication--contributes to his chronic pain syndrome  . Normal memory function 08/2014   Neuropsychological testing ([facility]): no cognitive impairment or sign of neurodegenerative disorder.  Likely has adjustment d/o with mixed anxiety/depressed features and may benefit from low dose SNRI.    [name] Normocytic anemia 03/2016   Mild-pt needs ferritin and vit B12 level checked (as of 03/22/16)  . NSTEMI (non-ST elevated myocardial infarction) ([place]) 03/20/2016   BMS to RCA  . Obesity hypoventilation syndrome ([place])   . Obesity, unspecified   . Orthostatic hypotension   . OSA on CPAP    8 cm H2O  . OSTEOARTHRITIS   . Paroxysmal atrial fibrillation ([place]) 2003    (? chronic?) Off anticoag for a while due to falls.  Then apixaban started 12/2014.  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 Ella Bodo)  . Personal history of colonic adenoma 10/30/2012   Diminutive adenoma, consider repeat 2019 per GI  . Primary osteoarthritis of both knees    Bone on bone of medial compartments, + signif patellofemoral arth bilat.--supartz inj series started 09/12/17  . PUD (peptic ulcer disease)   . PULMONARY HYPERTENSION, HX OF   . Secondary male hypogonadism 2017  . 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 Shortness of breath    with exertion: much improved s/p TAVR and treatment for CHF.  . Sick sinus syndrome (HCC)    PPM placed  . Thrombocytopenia ([place]) 2018   HSM on 2007 abd u/s---suspect some mild splenic sequestration chronically.  [name] Unspecified glaucoma(365.9)   . Unspecified hereditary and idiopathic peripheral neuropathy approx age 8   bilat LE's, ? left arm, too.  Feet became progressively numb + left foot pain intermittently.  Pt may be  trying a spinal stimulator (as of 05/2015)  . VENOUS INSUFFICIENCY    Being followed by Dr. Sharol Given as of 10/2016  for two R LL venous stasis ulcers/skin tears.  Healed as of 10/30/16 f/u with Dr. Sharol Given.  . VENTRAL HERNIA    BP 134/73   Pulse 74   Ht 6' (1.829 m)   Wt (!) 343 lb 6.4 oz (155.8 kg)   SpO2 95%   BMI 46.57 kg/m   Opioid Risk Score:   Fall Risk Score:  `1  Depression screen PHQ 2/9  Depression screen [facility] 2/9 01/15/2018 09/17/2017 08/20/2017 07/18/2017 05/22/2017 02/06/2017 01/25/2017  Decreased Interest 0 0 0 0 0 0 0  Down, Depressed, Hopeless 0 0 0 0 0 0 0  PHQ - 2 Score 0 0 0 0 0 0 0  Altered sleeping - - - - - - -  Tired, decreased energy - - - - - - -  Change in appetite - - - - - - -  Feeling bad or failure about yourself  - - - - - - -  Trouble concentrating - - - - - - -  Moving slowly or fidgety/restless - - - - - - -  Suicidal thoughts - - - - - - -  PHQ-9 Score - - - - - - -  Some recent data might be hidden    Review of Systems  Constitutional: Negative.   HENT: Negative.   Eyes: Negative.   Respiratory: Negative.   Cardiovascular: Negative.   Gastrointestinal: Negative.   Endocrine: Negative.   Genitourinary: Negative.   Musculoskeletal: Negative.   Skin: Negative.   Allergic/Immunologic: Negative.   Neurological: Negative.   Hematological: Negative.   Psychiatric/Behavioral: Negative.   All other systems reviewed and are negative.      Objective:   Physical Exam  Constitutional: He is oriented to person, place, and time. He appears well-developed.  HENT:  Head: Normocephalic and atraumatic.  Eyes: Pupils are equal, round, and reactive to light. EOM are normal.  Neck: Normal range of motion. Neck supple.  Neurological: He is alert and oriented to person, place, and time.  Skin: Skin is warm and dry.  Psychiatric: He has a normal mood and affect.  Nursing note and vitals reviewed.  Obese male no acute distress Mood and affect are  appropriate Lower extremity strength is 5/5 bilateral hip flexor knee extensor ankle dorsiflexor His back is no tenderness palpation in the lumbar paraspinals. He is limited lumbar spine range of motion of approximately 25% flexion extension lateral bending and rotation.  His gait is without evidence of toe drag or knee instability.  No limitation of the range of motion or with ankle range of motion.     Assessment & Plan:  1.  Chronic neuropathic pain which has been difficult to treat.  He has tried Nucynta.  He is on maximum dose of gabapentin.  He is failed spinal cord stimulator trials. His pain does wake him up at night.  We have discussed alternatives to his current regimen.  Will trial OxyContin 30 mg nightly.  If he wakes up he can use the tramadol. Other options would be second opinion for spinal cord stimulation such as [facility] in [place] Dr. Mechele Dawley or Dr. Darral Dash May consider buprenorphine as an alternative to oxycodone

## 2018-01-25 ENCOUNTER — Encounter: Payer: Self-pay | Admitting: Family Medicine

## 2018-02-07 ENCOUNTER — Encounter: Payer: Self-pay | Admitting: Registered Nurse

## 2018-02-07 ENCOUNTER — Encounter: Payer: Medicare Other | Attending: Registered Nurse | Admitting: Registered Nurse

## 2018-02-07 VITALS — BP 136/77 | HR 76 | Resp 14

## 2018-02-07 DIAGNOSIS — M109 Gout, unspecified: Secondary | ICD-10-CM | POA: Insufficient documentation

## 2018-02-07 DIAGNOSIS — M79672 Pain in left foot: Secondary | ICD-10-CM | POA: Diagnosis present

## 2018-02-07 DIAGNOSIS — I2510 Atherosclerotic heart disease of native coronary artery without angina pectoris: Secondary | ICD-10-CM | POA: Insufficient documentation

## 2018-02-07 DIAGNOSIS — E1140 Type 2 diabetes mellitus with diabetic neuropathy, unspecified: Secondary | ICD-10-CM

## 2018-02-07 DIAGNOSIS — Z5181 Encounter for therapeutic drug level monitoring: Secondary | ICD-10-CM | POA: Diagnosis not present

## 2018-02-07 DIAGNOSIS — K219 Gastro-esophageal reflux disease without esophagitis: Secondary | ICD-10-CM | POA: Insufficient documentation

## 2018-02-07 DIAGNOSIS — J449 Chronic obstructive pulmonary disease, unspecified: Secondary | ICD-10-CM | POA: Insufficient documentation

## 2018-02-07 DIAGNOSIS — M48062 Spinal stenosis, lumbar region with neurogenic claudication: Secondary | ICD-10-CM | POA: Diagnosis not present

## 2018-02-07 DIAGNOSIS — E7800 Pure hypercholesterolemia, unspecified: Secondary | ICD-10-CM | POA: Insufficient documentation

## 2018-02-07 DIAGNOSIS — G609 Hereditary and idiopathic neuropathy, unspecified: Secondary | ICD-10-CM | POA: Insufficient documentation

## 2018-02-07 DIAGNOSIS — M1990 Unspecified osteoarthritis, unspecified site: Secondary | ICD-10-CM | POA: Diagnosis not present

## 2018-02-07 DIAGNOSIS — G894 Chronic pain syndrome: Secondary | ICD-10-CM | POA: Diagnosis not present

## 2018-02-07 DIAGNOSIS — Z79891 Long term (current) use of opiate analgesic: Secondary | ICD-10-CM

## 2018-02-07 MED ORDER — OXYCODONE HCL ER 30 MG PO T12A: 30.0000 mg | EXTENDED_RELEASE_TABLET | Freq: Every day | ORAL | 0 refills | Status: DC

## 2018-02-07 MED ORDER — TRAMADOL-ACETAMINOPHEN 37.5-325 MG PO TABS: 1.0000 | ORAL_TABLET | Freq: Three times a day (TID) | ORAL | 3 refills | Status: DC | PRN

## 2018-02-07 NOTE — Progress Notes (Signed)
Subjective:    Patient ID: Tim [name], male    DOB: 22-Apr-1941, 77 y.o.   MRN: [id number]  HPI: Tim [name] is a 77 year old male who returns for follow up appointment for chronic pain and medication refill. He states his pain is located in his lower back and left foot. He rates his pain 6. His current exercise regime is walking.  Mr. Goerner states his pain has been managed with the Oxycontin and Tramadol for break through pain.   Mr. Ganoe Morphine Equivalent is 56.25 MME. Last UDS was Performed on 09/17/2017, it was consistent.   Pain Inventory Average Pain 3 Pain Right Now 6 My pain is sharp, burning, dull, stabbing, tingling and aching  In the last 24 hours, has pain interfered with the following? General activity 8 Relation with others 8 Enjoyment of life 3 What TIME of day is your pain at its worst? night Sleep (in general) Poor  Pain is worse with: walking, bending, standing and unsure Pain improves with: rest, pacing activities and medication Relief from Meds: 7  Mobility walk with assistance use a cane ability to climb steps?  no do you drive?  yes  Function retired I need assistance with the following:  meal prep, household duties and shopping  Neuro/Psych weakness numbness tremor tingling trouble walking spasms  Prior Studies Any changes since last visit?  no  Physicians involved in your care Any changes since last visit?  no   Family History  Problem Relation Age of Onset  . Hypertension Mother   . Coronary artery disease Mother   . Heart attack Mother   . Neuropathy Mother   . Pulmonary fibrosis Father        asbestosis   Social History   Socioeconomic History  . Marital status: Married    Spouse name: Not on file  . Number of children: 0  . Years of education: 68  . Highest education level: Not on file  Occupational History  . Occupation: Engineer, production    Comment: retired  Scientific laboratory technician  . Financial resource strain: Not  on file  . Food insecurity:    Worry: Not on file    Inability: Not on file  . Transportation needs:    Medical: Not on file    Non-medical: Not on file  Tobacco Use  . Smoking status: Never Smoker  . Smokeless tobacco: Never Used  Substance and Sexual Activity  . Alcohol use: No    Alcohol/week: 0.0 standard drinks  . Drug use: No    Types: Oxycodone  . Sexual activity: Not Currently  Lifestyle  . Physical activity:    Days per week: Not on file    Minutes per session: Not on file  . Stress: Not on file  Relationships  . Social connections:    Talks on phone: Not on file    Gets together: Not on file    Attends religious service: Not on file    Active member of club or organization: Not on file    Attends meetings of clubs or organizations: Not on file    Relationship status: Not on file  Other Topics Concern  . Not on file  Social History Narrative   HSG, [name] - BS, [name] - MS-engineering, 2 years on PhD - [employer]. Married - '65 - 76yr/divorced; '76- 3 yrs/divorced; '92 . No children. 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 ACP/Living Will - Yes CPR;  long-term Mechanical ventilation as long as he was able to cognate; ok for long term artificial nutrition. Precondition being able to cognate and not to have too much pain.    Past Surgical History:  Procedure Laterality Date  . AMPUTATION Left 04/11/2013   Procedure: AMPUTATION DIGIT Left 3rd toe;  Surgeon: [name], MD;  Location: [facility];  Service: Orthopedics;  Laterality: Left;  Left 3rd toe amputation at [facility]  . CARDIAC CATHETERIZATION  1997; 03/10/16   1997 Non-obstructive disease.  03/2016 BMS to RCA, with 25% pDiag dz, o/w normal cors per cath 03/07/16.  Cath 11/01/16: in stent restenosis, successful baloon angioplasty.  [name] CARDIAC CATHETERIZATION  12/24/2012   mild < 20% LCx, prox 30% RCA; LVEF 55-65% , moderate pulmonary HTN, moderate AS  . CARDIOVASCULAR STRESS TEST  05/11/16  ([facility])   Myocardial perfusion imaging:  No ischemia; scar in apex, global hypokinesis, EF 36%.  . Carotid dopplers  03/09/2016   [facility]: no hemodynamically significant stenosis on either side.  . CHOLECYSTECTOMY    . COLONOSCOPY    . COLONOSCOPY [name] 10/30/2012   Procedure: COLONOSCOPY;  Surgeon: [name], MD;  Location: [facility];  Service: Endoscopy;  Laterality: [name];  . CORONARY ANGIOPLASTY WITH STENT PLACEMENT  03/2016; 04/2017   2017-[facility]: BMS to RCA-pt was placed on Brilinta.  04/2017: DES to RCA.  [name] EYE SURGERY Bilateral cataract  . HEMORRHOID SURGERY    . INTRAOCULAR LENS INSERTION Bilateral   . KNEE SURGERY Right   . LEFT AND RIGHT HEART CATHETERIZATION WITH CORONARY ANGIOGRAM [name] 12/24/2012   Procedure: LEFT AND RIGHT HEART CATHETERIZATION WITH CORONARY ANGIOGRAM;  Surgeon: [name], MD;  Location: [facility] CATH LAB;  Service: Cardiovascular;  Laterality: [name];  . LEG SURGERY Bilateral    lenghtening   . PACEMAKER PLACEMENT  04/13/2016   2nd deg HB after TAVR, pt had DC MDT PPM placed.  [name] SHOULDER ARTHROSCOPY  08/30/2011   Procedure: ARTHROSCOPY SHOULDER;  Surgeon: [name], MD;  Location: [facility];  Service: Orthopedics;  Laterality: Right;  Right Shoulder Arthroscopy, Debridement, and Decompression  . SPINAL CORD STIMULATOR INSERTION [name] 09/10/2015   Procedure: LUMBAR SPINAL CORD STIMULATOR INSERTION;  Surgeon: [name], MD;  Location: [facility];  Service: Neurosurgery;  Laterality: [name];  . 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  R big toe surg due to osteoarth  . TONSILLECTOMY    . traeculectomy Left    eye  . TRANSCATHETER AORTIC VALVE REPLACEMENT, TRANSFEMORAL  04/11/2016  . TRANSESOPHAGEAL ECHOCARDIOGRAM  03/09/2016   [facility]: EF 55-60%, PFO seen with bi-directional shunting, no thrombus in appendage.  . TRANSTHORACIC ECHOCARDIOGRAM  01/2015; 01/2016; 05/18/16; 09/18/16, 05/2017, 08/2017   01/2015 No signif change in aortic stenosis (moderate).  01/2016  Severe LVH w/small LV cavity, EF 60-65%, grade I diast dysfxn.  05/2016 (s/p TAVR): EF 50-55%, grd I DD, biopros AV good.  08/2016--EF 50-55%, LV septal motion c/w conduction abnl, grd I DD,mild MS,bioprosth aortic valve well seated, w/trace AR. 05/2017 TTE EF 35%. 08/2017-EF 35%, mod diff hypokin LV, grd I DD, biopros AV good.  [name] VITRECTOMY     Past Medical History:  Diagnosis Date  . ASTHMA   . BPH (benign prostatic hypertrophy)   . CAD (coronary artery disease)    Nonobstructive by 12/2012 cath; then 03/2016 he required BMS to RCA ([facility]).  In-stent restenosis on cath 11/01/16, baloon angioplasty successful--pt to take plavix (no ASA), + eliquis (for PAF).  [name]  CATARACT, HX OF   . Chronic combined systolic and diastolic CHF (congestive heart failure) ([place]) 05/2016   Ischemic CM.  [name] Chronic pain syndrome    Lumbar DDD; chronic neuropathic pain (DM); has spinal stimulator and sees pain mgmt MD  . COPD   . Diabetes mellitus type 2 with complications (HCC)    HAL9F jumped from 5.7% to 6.1% 03/2015---started metformin at that time.  DM 2 dx by fasting gluc criteria 2018.  Has chronic neuropath pain  . Elevated transaminase level 02/2016   Suspect due to fatty liver (documented on u/s 2007). Plan repeat labs 03/2016.  [name] Essential hypertension, benign   . Fatty liver 2007   2007 u/s showed fatty liver with hepatosplenomegaly.  2019 repeat u/s->fatty liver but no cirrhosis or hepatosplenomegaly.  [name] GERD (gastroesophageal reflux disease)   . Glaucoma   . GOUT   . Hallux valgus (acquired)   . HH (hiatus hernia)   . HYPERCHOLESTEROLEMIA-PURE   . Hypogonadism male   . Lumbosacral neuritis   . Lumbosacral spondylosis    Lumbar spinal stenosis with neurogenic claudication--contributes to his chronic pain syndrome  . Normal memory function 08/2014   Neuropsychological testing ([facility]): no cognitive impairment or sign of neurodegenerative disorder.  Likely has adjustment d/o with mixed  anxiety/depressed features and may benefit from low dose SNRI.    [name] Normocytic anemia 03/2016   Mild-pt needs ferritin and vit B12 level checked (as of 03/22/16)  . NSTEMI (non-ST elevated myocardial infarction) ([place]) 03/20/2016   BMS to RCA  . Obesity hypoventilation syndrome ([place])   . Obesity, unspecified   . Orthostatic hypotension   . OSA on CPAP    8 cm H2O  . OSTEOARTHRITIS   . Paroxysmal atrial fibrillation ([place]) 2003    (? chronic?) Off anticoag for a while due to falls.  Then apixaban started 12/2014.  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 Ella Bodo): neuropathic pain->diff to treat, failed nucynta, failed spinal stimul trial, oxycontin hs + tramadol + gabap as of 12/2017 f/u Dr. Letta Pate.  . Personal history of colonic adenoma 10/30/2012   Diminutive adenoma, consider repeat 2019 per GI  . Primary osteoarthritis of both knees    Bone on bone of medial compartments, + signif patellofemoral arth bilat.--supartz inj series started 09/12/17  . PUD (peptic ulcer disease)   . PULMONARY HYPERTENSION, HX OF   . Secondary male hypogonadism 2017  . 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 Shortness of breath    with exertion: much improved s/p TAVR and treatment for CHF.  . Sick sinus syndrome (HCC)    PPM placed  . Thrombocytopenia ([place]) 2018   HSM on 2007 abd u/s---suspect some mild splenic sequestration chronically.  [name] Unspecified glaucoma(365.9)   . Unspecified hereditary and idiopathic peripheral neuropathy approx age 33   bilat LE's, ? left arm, too.  Feet became progressively numb + left foot pain intermittently.  Pt may be trying a spinal stimulator (as of 05/2015)  . VENOUS INSUFFICIENCY    Being followed by Dr. Sharol Given as of 10/2016 for two R LL venous stasis ulcers/skin tears.  Healed as of 10/30/16 f/u with Dr. Sharol Given.  . VENTRAL HERNIA    BP 136/77 (BP Location: Left Arm,  Patient Position: Sitting, Cuff Size: Large)   Pulse 76   Resp 14   SpO2 96%   Opioid  Risk Score:   Fall Risk Score:  `1  Depression screen PHQ 2/9  Depression screen [facility] 2/9 01/15/2018 09/17/2017 08/20/2017 07/18/2017 05/22/2017 02/06/2017 01/25/2017  Decreased Interest 0 0 0 0 0 0 0  Down, Depressed, Hopeless 0 0 0 0 0 0 0  PHQ - 2 Score 0 0 0 0 0 0 0  Altered sleeping - - - - - - -  Tired, decreased energy - - - - - - -  Change in appetite - - - - - - -  Feeling bad or failure about yourself  - - - - - - -  Trouble concentrating - - - - - - -  Moving slowly or fidgety/restless - - - - - - -  Suicidal thoughts - - - - - - -  PHQ-9 Score - - - - - - -  Some recent data might be hidden    Review of Systems  Constitutional: Negative.   HENT: Negative.   Eyes: Negative.   Respiratory: Positive for apnea.   Cardiovascular: Positive for leg swelling.  Gastrointestinal: Negative.   Endocrine: Negative.   Genitourinary: Negative.   Musculoskeletal: Positive for arthralgias, back pain and gait problem.       Foot pain Spasms   Skin: Negative.   Allergic/Immunologic: Negative.   Neurological: Positive for tremors and numbness.       Tingling  Hematological: Bruises/bleeds easily.  Psychiatric/Behavioral: Negative.   All other systems reviewed and are negative.      Objective:   Physical Exam  Constitutional: He is oriented to person, place, and time. He appears well-developed and well-nourished.  HENT:  Head: Normocephalic and atraumatic.  Neck: Normal range of motion. Neck supple.  Cardiovascular: Normal rate and regular rhythm.  Pulmonary/Chest: Effort normal and breath sounds normal.  Musculoskeletal:  Normal Muscle Bulk and Muscle Testing Reveals: Upper Extremities:Right: Decreased ROM 45 Degrees and Muscle Strength 5/5 Left: Full ROM and Muscle Strength 5/5 Lower Extremities: Full ROM and Muscle Strength 5/5 Arises From chair slowly, using cane for support Narrow  Based Gait   Neurological: He is alert and oriented to person, place, and time.  Skin: Skin is warm and dry.  Psychiatric: He has a normal mood and affect. His behavior is normal.  Nursing note and vitals reviewed.         Assessment & Plan:  1. Diabetic neuropathy with chronic neuropathic pain: Using Tens Unit: Continuecurrent medication withGabapentin 800 mg QID. 02/07/2018 Refilled:Oxycontin 30 mg one tablet at bedtime #30  and Tramadol 37.5/325mg TID as needed.  We will continue the opioid monitoring program, this consists of regular clinic visits, examinations, urine drug screen, pill counts as well as use of [place] Controlled Substance Reporting System. 2. DDD lumbar spine with LBP: Continue to increase activity as tolerated and heat therapy. 02/07/2018 S/P Permanent Spinal Stimulator Placed 09/10/15 via Dr. Maryjean Ka. 3. Bilateral Chronic Knee Pain: No complaints Today. Orthopedist Following.Continue to Monitor. 02/07/2018.  20 minutes of face to face patient care time was spent during this visit. All questions were encouraged and answered.  F/U in 1 month

## 2018-02-07 NOTE — Patient Instructions (Signed)
Please Call Zella Ball on 08/19th or 08/20 regarding medication  Change.

## 2018-02-11 ENCOUNTER — Ambulatory Visit: Payer: Medicare Other

## 2018-02-12 ENCOUNTER — Ambulatory Visit (INDEPENDENT_AMBULATORY_CARE_PROVIDER_SITE_OTHER): Payer: Medicare Other

## 2018-02-12 ENCOUNTER — Encounter: Payer: Self-pay | Admitting: Family Medicine

## 2018-02-12 ENCOUNTER — Ambulatory Visit: Payer: Medicare Other | Admitting: Family Medicine

## 2018-02-12 VITALS — BP 126/76 | HR 79 | Temp 98.2°F | Resp 16 | Ht 72.0 in | Wt 351.0 lb

## 2018-02-12 DIAGNOSIS — Z0000 Encounter for general adult medical examination without abnormal findings: Secondary | ICD-10-CM | POA: Diagnosis not present

## 2018-02-12 DIAGNOSIS — R309 Painful micturition, unspecified: Secondary | ICD-10-CM

## 2018-02-12 DIAGNOSIS — R8290 Unspecified abnormal findings in urine: Secondary | ICD-10-CM

## 2018-02-12 LAB — POCT URINALYSIS DIPSTICK
Bilirubin, UA: NEGATIVE
Glucose, UA: NEGATIVE
KETONES UA: NEGATIVE
Nitrite, UA: NEGATIVE
Protein, UA: POSITIVE — AB
Spec Grav, UA: >=1.03 — AB (ref 1.010–1.025)
Urobilinogen, UA: 0.2 E.U./dL
pH, UA: 6 (ref 5.0–8.0)

## 2018-02-12 MED ORDER — CEPHALEXIN 500 MG PO CAPS: 500.0000 mg | ORAL_CAPSULE | Freq: Four times a day (QID) | ORAL | 0 refills | Status: DC

## 2018-02-12 NOTE — Progress Notes (Signed)
Tim [name] , 07-06-1940, 77 y.o., male MRN: [id number] Patient Care Team    Relationship Specialty Notifications Start End  Anitra Lauth, Adrian Blackwater, MD PCP - General Family Medicine  09/25/13    Comment: Dr. Linda Hedges transfer  [name], MD  Orthopedic Surgery  06/05/12   Charlett Blake, MD  Physical Medicine and Rehabilitation  06/05/12   Holli Humbles, MD Consulting Physician Pulmonary Disease  06/05/12    Comment: [facility]  Alda Berthold, DO Consulting Physician Neurology  09/01/16   Crissie Reese., MD Consulting Physician Cardiology  02/06/17   Hart Carwin, MD Referring Physician Cardiology  02/06/17   Almedia Balls, MD Consulting Physician Orthopedic Surgery  09/03/17   Rondel Oh, MD Referring Physician Ophthalmology  11/28/17     Chief Complaint  Patient presents with  . Dysuria     Subjective: Pt presents for an OV with complaints of dysuria of 1 week duration.  Associated symptoms include burning with urination, mild urinary incontinence, and frequency. He also endorses mild nausea that started today. He denies fever, chills, or vomit. He is eating and drinking his normal. He has never had a UTI before or kidney stones. He denies flank pain.   Depression screen [facility] 2/9 01/15/2018 09/17/2017 08/20/2017 07/18/2017 05/22/2017  Decreased Interest 0 0 0 0 0  Down, Depressed, Hopeless 0 0 0 0 0  PHQ - 2 Score 0 0 0 0 0  Altered sleeping - - - - -  Tired, decreased energy - - - - -  Change in appetite - - - - -  Feeling bad or failure about yourself  - - - - -  Trouble concentrating - - - - -  Moving slowly or fidgety/restless - - - - -  Suicidal thoughts - - - - -  PHQ-9 Score - - - - -  Some recent data might be hidden    Allergies  Allergen Reactions  . Brimonidine Tartrate Shortness Of Breath    Alphagan-Shortness of breath  . Brimonidine Tartrate Palpitations  . Brinzolamide Shortness Of Breath    AZOPT- Shortness of breath  .  Latanoprost Shortness Of Breath    XALATAN- Shortness of breath  . Nucynta [Tapentadol] Shortness Of Breath  . Sulfa Antibiotics Palpitations  . Timolol Maleate Shortness Of Breath and Other (See Comments)    TIMOPTIC- Aggravated asthma  . Cardizem  [Diltiazem Hcl] Swelling  . Diltiazem Swelling     leg swelling  . Rofecoxib Swelling     VIOXX- leg swelling  . Vancomycin Hives and Other (See Comments)    Possible "Red Man Syndrome"? > hives/blisters  . Codeine Other (See Comments)    Childhood reaction  . Celecoxib Other (See Comments)    CELLBREX-confusion  . Colchicine Diarrhea    diarrhea  . Tape Rash   Social History   Tobacco Use  . Smoking status: Never Smoker  . Smokeless tobacco: Never Used  Substance Use Topics  . Alcohol use: No    Alcohol/week: 0.0 standard drinks   Past Medical History:  Diagnosis Date  . ASTHMA   . BPH (benign prostatic hypertrophy)   . CAD (coronary artery disease)    Nonobstructive by 12/2012 cath; then 03/2016 he required BMS to RCA ([facility]).  In-stent restenosis on cath 11/01/16, baloon angioplasty successful--pt to take plavix (no ASA), + eliquis (for PAF).  [name] CATARACT, HX OF   . Chronic combined systolic and diastolic CHF (  congestive heart failure) ([place]) 05/2016   Ischemic CM.  [name] Chronic pain syndrome    Lumbar DDD; chronic neuropathic pain (DM); has spinal stimulator and sees pain mgmt MD  . COPD   . Diabetes mellitus type 2 with complications (HCC)    GUR4Y jumped from 5.7% to 6.1% 03/2015---started metformin at that time.  DM 2 dx by fasting gluc criteria 2018.  Has chronic neuropath pain  . Elevated transaminase level 02/2016   Suspect due to fatty liver (documented on u/s 2007). Plan repeat labs 03/2016.  [name] Essential hypertension, benign   . Fatty liver 2007   2007 u/s showed fatty liver with hepatosplenomegaly.  2019 repeat u/s->fatty liver but no cirrhosis or hepatosplenomegaly.  [name] GERD (gastroesophageal reflux disease)   .  Glaucoma   . GOUT   . Hallux valgus (acquired)   . HH (hiatus hernia)   . HYPERCHOLESTEROLEMIA-PURE   . Hypogonadism male   . Lumbosacral neuritis   . Lumbosacral spondylosis    Lumbar spinal stenosis with neurogenic claudication--contributes to his chronic pain syndrome  . Normal memory function 08/2014   Neuropsychological testing ([facility]): no cognitive impairment or sign of neurodegenerative disorder.  Likely has adjustment d/o with mixed anxiety/depressed features and may benefit from low dose SNRI.    [name] Normocytic anemia 03/2016   Mild-pt needs ferritin and vit B12 level checked (as of 03/22/16)  . NSTEMI (non-ST elevated myocardial infarction) ([place]) 03/20/2016   BMS to RCA  . Obesity hypoventilation syndrome ([place])   . Obesity, unspecified   . Orthostatic hypotension   . OSA on CPAP    8 cm H2O  . OSTEOARTHRITIS   . Paroxysmal atrial fibrillation ([place]) 2003    (? chronic?) Off anticoag for a while due to falls.  Then apixaban started 12/2014.  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 Ella Bodo): neuropathic pain->diff to treat, failed nucynta, failed spinal stimul trial, oxycontin hs + tramadol + gabap as of 12/2017 f/u Dr. Letta Pate.  . Personal history of colonic adenoma 10/30/2012   Diminutive adenoma, consider repeat 2019 per GI  . Primary osteoarthritis of both knees    Bone on bone of medial compartments, + signif patellofemoral arth bilat.--supartz inj series started 09/12/17  . PUD (peptic ulcer disease)   . PULMONARY HYPERTENSION, HX OF   . Secondary male hypogonadism 2017  . 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 Shortness of breath    with exertion: much improved s/p TAVR and treatment for CHF.  . Sick sinus syndrome (HCC)    PPM placed  . Thrombocytopenia ([place]) 2018   HSM on 2007 abd u/s---suspect some mild splenic sequestration chronically.  [name]  Unspecified glaucoma(365.9)   . Unspecified hereditary and idiopathic peripheral neuropathy approx age 53   bilat LE's, ? left arm, too.  Feet became progressively numb + left foot pain intermittently.  Pt may be trying a spinal stimulator (as of 05/2015)  . VENOUS INSUFFICIENCY    Being followed by Dr. Sharol Given as of 10/2016 for two R LL venous stasis ulcers/skin tears.  Healed as of 10/30/16 f/u with Dr. Sharol Given.  . VENTRAL HERNIA    Past Surgical History:  Procedure Laterality Date  . AMPUTATION Left 04/11/2013   Procedure: AMPUTATION DIGIT Left 3rd toe;  Surgeon: [name], MD;  Location: [place];  Service: Orthopedics;  Laterality: Left;  Left 3rd  toe amputation at [facility]  . CARDIAC CATHETERIZATION  1997; 03/10/16   1997 Non-obstructive disease.  03/2016 BMS to RCA, with 25% pDiag dz, o/w normal cors per cath 03/07/16.  Cath 11/01/16: in stent restenosis, successful baloon angioplasty.  [name] CARDIAC CATHETERIZATION  12/24/2012   mild < 20% LCx, prox 30% RCA; LVEF 55-65% , moderate pulmonary HTN, moderate AS  . CARDIOVASCULAR STRESS TEST  05/11/16 ([facility])   Myocardial perfusion imaging:  No ischemia; scar in apex, global hypokinesis, EF 36%.  . Carotid dopplers  03/09/2016   [facility]: no hemodynamically significant stenosis on either side.  . CHOLECYSTECTOMY    . COLONOSCOPY    . COLONOSCOPY [name] 10/30/2012   Procedure: COLONOSCOPY;  Surgeon: [name], MD;  Location: [facility];  Service: Endoscopy;  Laterality: [name];  . CORONARY ANGIOPLASTY WITH STENT PLACEMENT  03/2016; 04/2017   2017-[facility]: BMS to RCA-pt was placed on Brilinta.  04/2017: DES to RCA.  [name] EYE SURGERY Bilateral cataract  . HEMORRHOID SURGERY    . INTRAOCULAR LENS INSERTION Bilateral   . KNEE SURGERY Right   . LEFT AND RIGHT HEART CATHETERIZATION WITH CORONARY ANGIOGRAM [name] 12/24/2012   Procedure: LEFT AND RIGHT HEART CATHETERIZATION WITH CORONARY ANGIOGRAM;  Surgeon: [name], MD;  Location: [name] C[facility];  Service:  Cardiovascular;  Laterality: [name];  . LEG SURGERY Bilateral    lenghtening   . PACEMAKER PLACEMENT  04/13/2016   2nd deg HB after TAVR, pt had DC MDT PPM placed.  [name] SHOULDER ARTHROSCOPY  08/30/2011   Procedure: ARTHROSCOPY SHOULDER;  Surgeon: [name], MD;  Location: [place];  Service: Orthopedics;  Laterality: Right;  Right Shoulder Arthroscopy, Debridement, and Decompression  . SPINAL CORD STIMULATOR INSERTION [name] 09/10/2015   Procedure: LUMBAR SPINAL CORD STIMULATOR INSERTION;  Surgeon: [name], MD;  Location: [place]URO [facility]S;  Service: Neurosurgery;  Laterality: [name];  . 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  R big toe surg due to osteoarth  . TONSILLECTOMY    . traeculectomy Left    eye  . TRANSCATHETER AORTIC VALVE REPLACEMENT, TRANSFEMORAL  04/11/2016  . TRANSESOPHAGEAL ECHOCARDIOGRAM  03/09/2016   [facility]: EF 55-60%, PFO seen with bi-directional shunting, no thrombus in appendage.  . TRANSTHORACIC ECHOCARDIOGRAM  01/2015; 01/2016; 05/18/16; 09/18/16, 05/2017, 08/2017   01/2015 No signif change in aortic stenosis (moderate).  01/2016 Severe LVH w/small LV cavity, EF 60-65%, grade I diast dysfxn.  05/2016 (s/p TAVR): EF 50-55%, grd I DD, biopros AV good.  08/2016--EF 50-55%, LV septal motion c/w conduction abnl, grd I DD,mild MS,bioprosth aortic valve well seated, w/trace AR. 05/2017 TTE EF 35%. 08/2017-EF 35%, mod diff hypokin LV, grd I DD, biopros AV good.  [name] VITRECTOMY     Family History  Problem Relation Age of Onset  . Hypertension Mother   . Coronary artery disease Mother   . Heart attack Mother   . Neuropathy Mother   . Pulmonary fibrosis Father        asbestosis   Allergies as of 02/12/2018      Reactions   Brimonidine Tartrate Shortness Of Breath   Alphagan-Shortness of breath   Brimonidine Tartrate Palpitations   Brinzolamide Shortness Of Breath   AZOPT- Shortness of breath   Latanoprost Shortness Of Breath   XALATAN- Shortness of breath   Nucynta [tapentadol]  Shortness Of Breath   Sulfa Antibiotics Palpitations   Timolol Maleate Shortness Of Breath, Other (See Comments)   TIMOPTIC- Aggravated asthma   Cardizem  [  diltiazem Hcl] Swelling   Diltiazem Swelling    leg swelling   Rofecoxib Swelling    VIOXX- leg swelling   Vancomycin Hives, Other (See Comments)   Possible "Red Man Syndrome"? > hives/blisters   Codeine Other (See Comments)   Childhood reaction   Celecoxib Other (See Comments)   CELLBREX-confusion   Colchicine Diarrhea   diarrhea   Tape Rash      Medication List        Accurate as of 02/12/18  1:37 PM. Always use your most recent med list.          albuterol 108 (90 Base) MCG/ACT inhaler Commonly known as:  PROVENTIL HFA;VENTOLIN HFA Inhale 2 puffs into the lungs 4 (four) times daily as needed for wheezing or shortness of breath.   allopurinol 300 MG tablet Commonly known as:  ZYLOPRIM TAKE 1 TABLET BY MOUTH ONCE DAILY WITH FOOD   apixaban 5 MG Tabs tablet Commonly known as:  ELIQUIS Take 1 tablet (5 mg total) by mouth 2 (two) times daily.   aspirin EC 81 MG tablet Take 1 tablet by mouth daily.   B-complex with vitamin C tablet Take 1 tablet by mouth daily.   cadexomer iodine 0.9 % gel Commonly known as:  IODOSORB Apply 1 application topically daily as needed for wound care.   clotrimazole-betamethasone cream Commonly known as:  LOTRISONE apply to affected area twice a day   ezetimibe 10 MG tablet Commonly known as:  ZETIA take 1 tablet by mouth once daily   finasteride 5 MG tablet Commonly known as:  PROSCAR TAKE 1 TABLET BY MOUTH ONCE DAILY   fluticasone 50 MCG/ACT nasal spray Commonly known as:  FLONASE Place 2 sprays into both nostrils as needed for allergies or rhinitis.   Fluticasone-Salmeterol 250-50 MCG/DOSE Aepb Commonly known as:  ADVAIR Inhale 1 puff into the lungs daily as needed (for nasal congestion).   gabapentin 800 MG tablet Commonly known as:  NEURONTIN Take 1 tablet (800 mg  total) by mouth 4 (four) times daily.   isosorbide mononitrate 60 MG 24 hr tablet Commonly known as:  IMDUR Take 1 tablet (60 mg total) by mouth daily.   metFORMIN 500 MG tablet Commonly known as:  GLUCOPHAGE TAKE 1 TABLET BY MOUTH TWICE DAILY WITH FOOD   metoprolol succinate 50 MG 24 hr tablet Commonly known as:  TOPROL-XL Take 50 mg by mouth daily. Take with or immediately following a meal.   metoprolol succinate 25 MG 24 hr tablet Commonly known as:  TOPROL-XL Take 1 tablet by mouth daily. Along w/ 62m to equal 749mdaily   multivitamin capsule Take 1 capsule by mouth daily.   mupirocin ointment 2 % Commonly known as:  BACTROBAN Apply 1 application topically 2 (two) times daily. Apply to the affected area 2 times a day   niacin 1000 MG CR tablet Commonly known as:  NIASPAN Take 1 tablet (1,000 mg total) by mouth 2 (two) times daily.   nitroGLYCERIN 0.4 MG SL tablet Commonly known as:  NITROSTAT   omeprazole 10 MG capsule Commonly known as:  PRILOSEC TAKE 1 CAPSULE BY MOUTH ONCE DAILY   oxyCODONE 30 MG 12 hr tablet Take 1 tablet (30 mg total) by mouth at bedtime.   potassium chloride SA 20 MEQ tablet Commonly known as:  K-DUR,KLOR-CON Take 1 tablet (20 mEq total) by mouth daily.   rosuvastatin 40 MG tablet Commonly known as:  CRESTOR take 1 tablet by mouth once daily   sacubitril-valsartan  24-26 MG Commonly known as:  ENTRESTO Take 1 tablet by mouth 2 (two) times daily.   torsemide 20 MG tablet Commonly known as:  DEMADEX TAKE 1 TABLET BY MOUTH ONCE DAILY   traMADol-acetaminophen 37.5-325 MG tablet Commonly known as:  ULTRACET Take 1 tablet by mouth 3 (three) times daily as needed.   VITAMIN D (CHOLECALCIFEROL) PO Take 1 tablet by mouth daily.       All past medical history, surgical history, allergies, family history, immunizations andmedications were updated in the EMR today and reviewed under the history and medication portions of their EMR.      ROS: Negative, with the exception of above mentioned in HPI   Objective:  BP 126/76 (BP Location: Left Arm, Patient Position: Sitting, Cuff Size: Large)   Pulse 79   Temp 98.2 F (36.8 C) (Oral)   Resp 16   Ht 6' (1.829 m)   Wt (!) 351 lb (159.2 kg)   SpO2 96%   BMI 47.60 kg/m  Body mass index is 47.6 kg/m. Gen: Afebrile. No acute distress. Nontoxic in appearance, well developed, well nourished. Obese, wheel chaired male.  HENT: AT. Declo.MMM Eyes:Pupils Equal Round Reactive to light, Extraocular movements intact,  Conjunctiva without redness, discharge or icterus. CV: RRR 2/6 SM present Chest: CTAB, no wheeze or crackles.  Abd: Soft. obese. NTND. BS present.  MSK: no cva tenderness bilateral.  Skin: no rashes, purpura or petechiae.  Neuro:PERLA. EOMi. Alert. Oriented x3   No exam data present No results found. Results for orders placed or performed in visit on 02/12/18 (from the past 24 hour(s))  POCT urinalysis dipstick     Status: Abnormal   Collection Time: 02/12/18  1:35 PM  Result Value Ref Range   Color, UA yellow    Clarity, UA sl. cloudy    Glucose, UA Negative Negative   Bilirubin, UA Negative    Ketones, UA Negative    Spec Grav, UA >=1.030 (A) 1.010 - 1.025   Blood, UA 2+    pH, UA 6.0 5.0 - 8.0   Protein, UA Positive (A) Negative   Urobilinogen, UA 0.2 0.2 or 1.0 E.U./dL   Nitrite, UA Negative    Leukocytes, UA Large (3+) (A) Negative   Appearance     Odor      Assessment/Plan: JADORE VEALS is a 77 y.o. male present for OV for  Painful urination/abnormal urine.  - urine appears infected today + leuks and blood. Sent for culture.  - Hydrate. Start keflex qid- prescribed.  - POCT urinalysis dipstick - Urine Culture - f/u 1 week if not improved, sooner if worsening. ED/emergent symptoms discussed.    Reviewed expectations re: course of current medical issues.  Discussed self-management of symptoms.  Outlined signs and symptoms indicating  need for more acute intervention.  Patient verbalized understanding and all questions were answered.  Patient received an After-Visit Summary.  mod 25 used since he is seeing health coach after provider acute visit   Orders Placed This Encounter  Procedures  . POCT urinalysis dipstick     Note is dictated utilizing voice recognition software. Although note has been proof read prior to signing, occasional typographical errors still can be missed. If any questions arise, please do not hesitate to call for verification.   electronically signed by:  Howard Pouch, DO  [place]

## 2018-02-12 NOTE — Patient Instructions (Addendum)
Hydrate.  Start keflex today, and take every 6 hours for 7 days  We will call you with urine culture results once resolved in a few days.   The urine collected today, does appear infectious.  If you develop fever or so nauseated you can not take antibiotic--> go to ED, this would me a more serious infection.    Urinary Tract Infection, Adult A urinary tract infection (UTI) is an infection of any part of the urinary tract. The urinary tract includes the:  Kidneys.  Ureters.  Bladder.  Urethra.  These organs make, store, and get rid of pee (urine) in the body. Follow these instructions at home:  Take over-the-counter and prescription medicines only as told by your doctor.  If you were prescribed an antibiotic medicine, take it as told by your doctor. Do not stop taking the antibiotic even if you start to feel better.  Avoid the following drinks: ? Alcohol. ? Caffeine. ? Tea. ? Carbonated drinks.  Drink enough fluid to keep your pee clear or pale yellow.  Keep all follow-up visits as told by your doctor. This is important.  Make sure to: ? Empty your bladder often and completely. Do not to hold pee for long periods of time. ? Empty your bladder before and after sex. ? Wipe from front to back after a bowel movement if you are male. Use each tissue one time when you wipe. Contact a doctor if:  You have back pain.  You have a fever.  You feel sick to your stomach (nauseous).  You throw up (vomit).  Your symptoms do not get better after 3 days.  Your symptoms go away and then come back. Get help right away if:  You have very bad back pain.  You have very bad lower belly (abdominal) pain.  You are throwing up and cannot keep down any medicines or water. This information is not intended to replace advice given to you by your health care provider. Make sure you discuss any questions you have with your health care provider. Document Released: 12/06/2007 Document  Revised: 11/25/2015 Document Reviewed: 05/10/2015 Elsevier Interactive Patient Education  [employer].

## 2018-02-12 NOTE — Progress Notes (Addendum)
Subjective:   Tim [name] is a 77 y.o. male who presents for Medicare Annual/Subsequent preventive examination.  Review of Systems:  No ROS.  Medicare Wellness Visit. Additional risk factors are reflected in the social history.  Cardiac Risk Factors include: advanced age (>77mn, >>2women);diabetes mellitus;dyslipidemia;hypertension;male gender;obesity (BMI >30kg/m2);family history of premature cardiovascular disease;sedentary lifestyle   Sleep patterns: Sleeps poorly d/t neuropathy pain. Uses CPAP. Home Safety/Smoke Alarms: Feels safe in home. Smoke alarms in place.  Living environment; residence and Firearm Safety:Lives with wife in 2 story home. Does not access upstairs.  Seat Belt Safety/Bike Helmet: Wears seat belt.   Male:   CCS-Colonoscopy 2014, polyps. Recall 5 years.      PSA-  Lab Results  Component Value Date   PSA 0.23 11/23/2015   PSA 0.33 10/07/2014   PSA 0.40 09/25/2013       Objective:    Vitals: BP 126/76 (BP Location: Left Arm, Patient Position: Sitting, Cuff Size: Large) Comment: VS obtained by SEtheleen Sia LPN  Pulse 79   Temp 98.2 F (36.8 C) (Oral)   Resp 16   Ht 6' (1.829 m)   Wt (!) 351 lb (159.2 kg)   SpO2 96%   BMI 47.60 kg/m   Body mass index is 47.6 kg/m.  Advanced Directives 02/12/2018 04/26/2017 03/22/2017 02/22/2017 02/06/2017 01/25/2017 12/26/2016  Does Patient Have a Medical Advance Directive? Yes Yes Yes Yes Yes Yes Yes  Type of Advance Directive Living will;Healthcare Power of [place]HaynesLiving will HTexolaLiving will [place]ountain ViewLiving will HSt. HelenaLiving will [place]overLiving will HCoaltonLiving will  Does patient want to make changes to medical advance directive? - - - - - - -  Copy of HF[place]xtonin Chart? Yes No - copy requested No - copy requested No - copy requested No - copy  requested No - copy requested -  Would patient like information on creating a medical advance directive? - - - - - - -    Tobacco Social History   Tobacco Use  Smoking Status Never Smoker  Smokeless Tobacco Never Used     Counseling given: Not Answered    Past Medical History:  Diagnosis Date  . ASTHMA   . BPH (benign prostatic hypertrophy)   . CAD (coronary artery disease)    Nonobstructive by 12/2012 cath; then 03/2016 he required BMS to RCA ([facility]).  In-stent restenosis on cath 11/01/16, baloon angioplasty successful--pt to take plavix (no ASA), + eliquis (for PAF).  .Marland KitchenCATARACT, HX OF   . Chronic combined systolic and diastolic CHF (congestive heart failure) ([place] 05/2016   Ischemic CM.  .Marland KitchenChronic pain syndrome    Lumbar DDD; chronic neuropathic pain (DM); has spinal stimulator and sees pain mgmt MD  . COPD   . Diabetes mellitus type 2 with complications (HCC)    [id number] from 5.7% to 6.1% 03/2015---started metformin at that time.  DM 2 dx by fasting gluc criteria 2018.  Has chronic neuropath pain  . Elevated transaminase level 02/2016   Suspect due to fatty liver (documented on u/s 2007). Plan repeat labs 03/2016.  .Marland KitchenEssential hypertension, benign   . Fatty liver 2007   2007 u/s showed fatty liver with hepatosplenomegaly.  2019 repeat u/s->fatty liver but no cirrhosis or hepatosplenomegaly.  .Marland KitchenGERD (gastroesophageal reflux disease)   . Glaucoma   . GOUT   . Hallux valgus (acquired)   .  HH (hiatus hernia)   . HYPERCHOLESTEROLEMIA-PURE   . Hypogonadism male   . Lumbosacral neuritis   . Lumbosacral spondylosis    Lumbar spinal stenosis with neurogenic claudication--contributes to his chronic pain syndrome  . Normal memory function 08/2014   Neuropsychological testing ([facility]): no cognitive impairment or sign of neurodegenerative disorder.  Likely has adjustment d/o with mixed anxiety/depressed features and may benefit from low dose SNRI.    [name]  Normocytic anemia 03/2016   Mild-pt needs ferritin and vit B12 level checked (as of 03/22/16)  . NSTEMI (non-ST elevated myocardial infarction) ([place]) 03/20/2016   BMS to RCA  . Obesity hypoventilation syndrome ([place])   . Obesity, unspecified   . Orthostatic hypotension   . OSA on CPAP    8 cm H2O  . OSTEOARTHRITIS   . Paroxysmal atrial fibrillation ([place]) 2003    (? chronic?) Off anticoag for a while due to falls.  Then apixaban started 12/2014.  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 Ella Bodo): neuropathic pain->diff to treat, failed nucynta, failed spinal stimul trial, oxycontin hs + tramadol + gabap as of 12/2017 f/u Dr. Letta Pate.  . Personal history of colonic adenoma 10/30/2012   Diminutive adenoma, consider repeat 2019 per GI  . Presence of cardiac defibrillator 11/07/2017  . Primary osteoarthritis of both knees    Bone on bone of medial compartments, + signif patellofemoral arth bilat.--supartz inj series started 09/12/17  . PUD (peptic ulcer disease)   . PULMONARY HYPERTENSION, HX OF   . Secondary male hypogonadism 2017  . 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 Shortness of breath    with exertion: much improved s/p TAVR and treatment for CHF.  . Sick sinus syndrome (HCC)    PPM placed  . Thrombocytopenia ([place]) 2018   HSM on 2007 abd u/s---suspect some mild splenic sequestration chronically.  [name] Unspecified glaucoma(365.9)   . Unspecified hereditary and idiopathic peripheral neuropathy approx age 20   bilat LE's, ? left arm, too.  Feet became progressively numb + left foot pain intermittently.  Pt may be trying a spinal stimulator (as of 05/2015)  . VENOUS INSUFFICIENCY    Being followed by Dr. Sharol Given as of 10/2016 for two R LL venous stasis ulcers/skin tears.  Healed as of 10/30/16 f/u with Dr. Sharol Given.  . VENTRAL HERNIA    Past Surgical History:  Procedure Laterality Date  .  AMPUTATION Left 04/11/2013   Procedure: AMPUTATION DIGIT Left 3rd toe;  Surgeon: [name], MD;  Location: [facility];  Service: Orthopedics;  Laterality: Left;  Left 3rd toe amputation at [facility]  . CARDIAC CATHETERIZATION  1997; 03/10/16   1997 Non-obstructive disease.  03/2016 BMS to RCA, with 25% pDiag dz, o/w normal cors per cath 03/07/16.  Cath 11/01/16: in stent restenosis, successful baloon angioplasty.  [name] CARDIAC CATHETERIZATION  12/24/2012   mild < 20% LCx, prox 30% RCA; LVEF 55-65% , moderate pulmonary HTN, moderate AS  . CARDIAC DEFIBRILLATOR PLACEMENT  11/07/2017   [facility] MRI Quad CRT defibrillator  . CARDIOVASCULAR STRESS TEST  05/11/16 ([facility])   Myocardial perfusion imaging:  No ischemia; scar in apex, global hypokinesis, EF 36%.  . Carotid dopplers  03/09/2016   [facility]: no hemodynamically significant stenosis on either side.  . CHOLECYSTECTOMY    . COLONOSCOPY    . COLONOSCOPY [name] 10/30/2012   Procedure: COLONOSCOPY;  Surgeon: Ofilia Neas  Carlean Purl, MD;  Location: [facility];  Service: Endoscopy;  Laterality: [name];  . CORONARY ANGIOPLASTY WITH STENT PLACEMENT  03/2016; 04/2017   2017-[facility]: BMS to RCA-pt was placed on Brilinta.  04/2017: DES to RCA.  [name] EYE SURGERY Bilateral cataract  . HEMORRHOID SURGERY    . INTRAOCULAR LENS INSERTION Bilateral   . KNEE SURGERY Right   . LEFT AND RIGHT HEART CATHETERIZATION WITH CORONARY ANGIOGRAM [name] 12/24/2012   Procedure: LEFT AND RIGHT HEART CATHETERIZATION WITH CORONARY ANGIOGRAM;  Surgeon: [name], MD;  Location: [facility];  Service: Cardiovascular;  Laterality: [name];  . LEG SURGERY Bilateral    lenghtening   . PACEMAKER PLACEMENT  04/13/2016   2nd deg HB after TAVR, pt had DC MDT PPM placed.  [name] SHOULDER ARTHROSCOPY  08/30/2011   Procedure: ARTHROSCOPY SHOULDER;  Surgeon: [name], MD;  Location: [facility];  Service: Orthopedics;  Laterality: Right;  Right Shoulder Arthroscopy, Debridement, and Decompression  . SPINAL CORD STIMULATOR  INSERTION [name] 09/10/2015   Procedure: LUMBAR SPINAL CORD STIMULATOR INSERTION;  Surgeon: [name], MD;  Location: [facility];  Service: Neurosurgery;  Laterality: [name];  . 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  R big toe surg due to osteoarth  . TONSILLECTOMY    . traeculectomy Left    eye  . TRANSCATHETER AORTIC VALVE REPLACEMENT, TRANSFEMORAL  04/11/2016  . TRANSESOPHAGEAL ECHOCARDIOGRAM  03/09/2016   [facility]: EF 55-60%, PFO seen with bi-directional shunting, no thrombus in appendage.  . TRANSTHORACIC ECHOCARDIOGRAM  01/2015; 01/2016; 05/18/16; 09/18/16, 05/2017, 08/2017   01/2015 No signif change in aortic stenosis (moderate).  01/2016 Severe LVH w/small LV cavity, EF 60-65%, grade I diast dysfxn.  05/2016 (s/p TAVR): EF 50-55%, grd I DD, biopros AV good.  08/2016--EF 50-55%, LV septal motion c/w conduction abnl, grd I DD,mild MS,bioprosth aortic valve well seated, w/trace AR. 05/2017 TTE EF 35%. 08/2017-EF 35%, mod diff hypokin LV, grd I DD, biopros AV good.  [name] VITRECTOMY     Family History  Problem Relation Age of Onset  . Hypertension Mother   . Coronary artery disease Mother   . Heart attack Mother   . Neuropathy Mother   . Pulmonary fibrosis Father        asbestosis   Social History   Socioeconomic History  . Marital status: Married    Spouse name: Not on file  . Number of children: 0  . Years of education: 17  . Highest education level: Not on file  Occupational History  . Occupation: Engineer, production    Comment: retired  Scientific laboratory technician  . Financial resource strain: Not on file  . Food insecurity:    Worry: Not on file    Inability: Not on file  . Transportation needs:    Medical: Not on file    Non-medical: Not on file  Tobacco Use  . Smoking status: Never Smoker  . Smokeless tobacco: Never Used  Substance and Sexual Activity  . Alcohol use: No    Alcohol/week: 0.0 standard drinks  . Drug use: No    Types: Oxycodone  . Sexual activity: Not Currently  Lifestyle    . Physical activity:    Days per week: Not on file    Minutes per session: Not on file  . Stress: Not on file  Relationships  . Social connections:    Talks on phone: Not on file    Gets together: Not on file    Attends religious service:  Not on file    Active member of club or organization: Not on file    Attends meetings of clubs or organizations: Not on file    Relationship status: Not on file  Other Topics Concern  . Not on file  Social History Narrative   HSG, [name] - BS, [name] - MS-engineering, 2 years on PhD - [place]. Married - '65 - 42yr/divorced; '76- 3 yrs/divorced; '92 . No children. 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 ACP/Living Will - Yes CPR; long-term Mechanical ventilation as long as he was able to cognate; ok for long term artificial nutrition. Precondition being able to cognate and not to have too much pain.     Outpatient Encounter Medications as of 02/12/2018  Medication Sig  . albuterol (PROVENTIL HFA;VENTOLIN HFA) 108 (90 BASE) MCG/ACT inhaler Inhale 2 puffs into the lungs 4 (four) times daily as needed for wheezing or shortness of breath.  . allopurinol (ZYLOPRIM) 300 MG tablet TAKE 1 TABLET BY MOUTH ONCE DAILY WITH FOOD  . apixaban (ELIQUIS) 5 MG TABS tablet Take 1 tablet (5 mg total) by mouth 2 (two) times daily.  .Marland Kitchenaspirin EC 81 MG tablet Take 1 tablet by mouth daily.  . B Complex-C (B-COMPLEX WITH VITAMIN C) tablet Take 1 tablet by mouth daily.  . cadexomer iodine (IODOSORB) 0.9 % gel Apply 1 application topically daily as needed for wound care.  . clotrimazole-betamethasone (LOTRISONE) cream apply to affected area twice a day  . ezetimibe (ZETIA) 10 MG tablet take 1 tablet by mouth once daily  . finasteride (PROSCAR) 5 MG tablet TAKE 1 TABLET BY MOUTH ONCE DAILY  . fluticasone (FLONASE) 50 MCG/ACT nasal spray Place 2 sprays into both nostrils as needed for allergies or rhinitis.  . Fluticasone-Salmeterol (ADVAIR)  250-50 MCG/DOSE AEPB Inhale 1 puff into the lungs daily as needed (for nasal congestion).   . gabapentin (NEURONTIN) 800 MG tablet Take 1 tablet (800 mg total) by mouth 4 (four) times daily.  . isosorbide mononitrate (IMDUR) 60 MG 24 hr tablet Take 1 tablet (60 mg total) by mouth daily.  . metFORMIN (GLUCOPHAGE) 500 MG tablet TAKE 1 TABLET BY MOUTH TWICE DAILY WITH FOOD  . metoprolol succinate (TOPROL-XL) 25 MG 24 hr tablet Take 1 tablet by mouth daily. Along w/ 536mto equal 7512maily  . metoprolol succinate (TOPROL-XL) 50 MG 24 hr tablet Take 50 mg by mouth daily. Take with or immediately following a meal.  . Multiple Vitamin (MULTIVITAMIN) capsule Take 1 capsule by mouth daily.  . mupirocin ointment (BACTROBAN) 2 % Apply 1 application topically 2 (two) times daily. Apply to the affected area 2 times a day  . niacin (NIASPAN) 1000 MG CR tablet Take 1 tablet (1,000 mg total) by mouth 2 (two) times daily.  . nitroGLYCERIN (NITROSTAT) 0.4 MG SL tablet   . omeprazole (PRILOSEC) 10 MG capsule TAKE 1 CAPSULE BY MOUTH ONCE DAILY  . oxyCODONE (OXYCONTIN) 30 MG 12 hr tablet Take 1 tablet (30 mg total) by mouth at bedtime.  . potassium chloride SA (K-DUR,KLOR-CON) 20 MEQ tablet Take 1 tablet (20 mEq total) by mouth daily.  . rosuvastatin (CRESTOR) 40 MG tablet take 1 tablet by mouth once daily  . sacubitril-valsartan (ENTRESTO) 24-26 MG Take 1 tablet by mouth 2 (two) times daily.  . tMarland Kitchenrsemide (DEMADEX) 20 MG tablet TAKE 1 TABLET BY MOUTH ONCE DAILY (Patient taking differently: TAKE 2 TABLETS  BY MOUTH ONCE DAILY)  . traMADol-acetaminophen (ULTRACET)  37.5-325 MG tablet Take 1 tablet by mouth 3 (three) times daily as needed.  [name] VITAMIN D, CHOLECALCIFEROL, PO Take 1 tablet by mouth daily.    No facility-administered encounter medications on file as of 02/12/2018.     Activities of Daily Living In your present state of health, do you have any difficulty performing the following activities: 02/12/2018    Hearing? N  Vision? N  Difficulty concentrating or making decisions? N  Walking or climbing stairs? Y  Dressing or bathing? N  Doing errands, shopping? N  Preparing Food and eating ? N  Using the Toilet? N  In the past six months, have you accidently leaked urine? N  Do you have problems with loss of bowel control? N  Managing your Medications? N  Managing your Finances? N  Housekeeping or managing your Housekeeping? N  Some recent data might be hidden    Patient Care Team: Tammi Sou, MD as PCP - General (Family Medicine) [name], MD (Orthopedic Surgery) Kirsteins, Luanna Salk, MD (Physical Medicine and Rehabilitation) Holli Humbles, MD as Consulting Physician (Pulmonary Disease) Alda Berthold, DO as Consulting Physician (Neurology) Crissie Reese., MD as Consulting Physician (Cardiology) Hart Carwin, MD as Referring Physician (Cardiology) Almedia Balls, MD as Consulting Physician (Orthopedic Surgery) Bond, Tracie Harrier, MD as Referring Physician (Ophthalmology)   Assessment:   This is a routine wellness examination for Tim [name].  Exercise Activities and Dietary recommendations Current Exercise Habits: The patient does not participate in regular exercise at present, Exercise limited by: orthopedic condition(s);neurologic condition(s)   Diet (meal preparation, eat out, water intake, caffeinated beverages, dairy products, fruits and vegetables): Drinks water and diet coke.   Breakfast: oatmeal and fruit Lunch: sandwich (chicken salad) Dinner: bread/butter; protein; vegetables Plenty of fruit/vegetables.        Chair/Bed exercises emailed to patient via EMMI portal.   Goals      Patient Stated   . Patient states (pt-stated)     Maintain current weight.       Other   . Patient Stated     Survive.        Fall Risk Fall Risk  02/12/2018 02/07/2018 01/15/2018 12/20/2017 11/22/2017  Falls in the past year? No No No No No  Comment - - - - -  Number  falls in past yr: - - - - -  Injury with Fall? - - - - -  Comment - - - - -  Risk for fall due to : - - - - -  Follow up - - - - -  Comment - - - - -    Depression Screen PHQ 2/9 Scores 02/12/2018 01/15/2018 09/17/2017 08/20/2017  PHQ - 2 Score 0 0 0 0  PHQ- 9 Score - - - -    Cognitive Function MMSE - Mini Mental State Exam 02/12/2018 02/06/2017  Not completed: Refused -  Orientation to time - 4  Orientation to Place - 5  Registration - 3  Attention/ Calculation - 3  Recall - 3  Language- name 2 objects - 2  Language- repeat - 1  Language- follow 3 step command - 3  Language- read & follow direction - 1  Write a sentence - 1  Copy design - 1  Total score - 27   Montreal Cognitive Assessment  07/14/2014  Visuospatial/ Executive (0/5) 4  Naming (0/3) 3  Attention: Read list of digits (0/2) 2  Attention: Read list of letters (0/1) 1  Attention: Serial 7 subtraction starting at 100 (0/3) 1  Language: Repeat phrase (0/2) 2  Language : Fluency (0/1) 1  Abstraction (0/2) 2  Delayed Recall (0/5) 1  Orientation (0/6) 5  Total 22  Adjusted Score (based on education) 22      Immunization History  Administered Date(s) Administered  . Influenza Whole 05/21/2008, 05/21/2009, 03/25/2010, 05/17/2012  . Influenza, High Dose Seasonal PF 03/22/2015, 03/31/2016, 04/28/2017  . Influenza, Seasonal, Injecte, Preservative Fre 03/19/2014  . Influenza,inj,Quad PF,6+ Mos 03/19/2014  . Pneumococcal Conjugate-13 09/10/2013  . Pneumococcal Polysaccharide-23 06/06/2004, 01/03/2008  . Td 08/06/1995, 09/08/2009  . Tdap 09/03/2009, 09/19/2016  . Zoster 05/17/2012    Screening Tests Health Maintenance  Topic Date Due  . INFLUENZA VACCINE  01/31/2018  . URINE MICROALBUMIN  02/09/2018  . HEMOGLOBIN A1C  05/31/2018  . OPHTHALMOLOGY EXAM  07/03/2018  . FOOT EXAM  01/16/2019  . TETANUS/TDAP  09/20/2026  . PNA vac Low Risk Adult  Completed        Plan:    Continue doing brain stimulating  activities (puzzles, reading, adult coloring books, staying active) to keep memory sharp.   I have personally reviewed and noted the following in the patient's chart:   . Medical and social history . Use of alcohol, tobacco or illicit drugs  . Current medications and supplements . Functional ability and status . Nutritional status . Physical activity . Advanced directives . List of other physicians . Hospitalizations, surgeries, and ER visits in previous 12 months . Vitals . Screenings to include cognitive, depression, and falls . Referrals and appointments  In addition, I have reviewed and discussed with patient certain preventive protocols, quality metrics, and best practice recommendations. A written personalized care plan for preventive services as well as general preventive health recommendations were provided to patient.     Gerilyn Nestle, RN  02/12/2018  Pt will call for F/U with PCP  Medical screening examination/treatment/procedure(s) were performed by non-physician practitioner and as supervising physician I was immediately available for consultation/collaboration.  I agree with above assessment and plan.  Electronically Signed by: Howard Pouch, DO  primary [place]

## 2018-02-12 NOTE — Patient Instructions (Addendum)
Continue doing brain stimulating activities (puzzles, reading, adult coloring books, staying active) to keep memory sharp.   Health Maintenance, Male A healthy lifestyle and preventive care is important for your health and wellness. Ask your health care provider about what schedule of regular examinations is right for you. What should I know about weight and diet? Eat a Healthy Diet  Eat plenty of vegetables, fruits, whole grains, low-fat dairy products, and lean protein.  Do not eat a lot of foods high in solid fats, added sugars, or salt.  Maintain a Healthy Weight Regular exercise can help you achieve or maintain a healthy weight. You should:  Do at least 150 minutes of exercise each week. The exercise should increase your heart rate and make you sweat (moderate-intensity exercise).  Do strength-training exercises at least twice a week.  Watch Your Levels of Cholesterol and Blood Lipids  Have your blood tested for lipids and cholesterol every 5 years starting at 78 years of age. If you are at high risk for heart disease, you should start having your blood tested when you are 78 years old. You may need to have your cholesterol levels checked more often if: ? Your lipid or cholesterol levels are high. ? You are older than 78 years of age. ? You are at high risk for heart disease.  What should I know about cancer screening? Many types of cancers can be detected early and may often be prevented. Lung Cancer  You should be screened every year for lung cancer if: ? You are a current smoker who has smoked for at least 30 years. ? You are a former smoker who has quit within the past 15 years.  Talk to your health care provider about your screening options, when you should start screening, and how often you should be screened.  Colorectal Cancer  Routine colorectal cancer screening usually begins at 78 years of age and should be repeated every 5-10 years until you are 78 years old. You  may need to be screened more often if early forms of precancerous polyps or small growths are found. Your health care provider may recommend screening at an earlier age if you have risk factors for colon cancer.  Your health care provider may recommend using home test kits to check for hidden blood in the stool.  A small camera at the end of a tube can be used to examine your colon (sigmoidoscopy or colonoscopy). This checks for the earliest forms of colorectal cancer.  Prostate and Testicular Cancer  Depending on your age and overall health, your health care provider may do certain tests to screen for prostate and testicular cancer.  Talk to your health care provider about any symptoms or concerns you have about testicular or prostate cancer.  Skin Cancer  Check your skin from head to toe regularly.  Tell your health care provider about any new moles or changes in moles, especially if: ? There is a change in a mole's size, shape, or color. ? You have a mole that is larger than a pencil eraser.  Always use sunscreen. Apply sunscreen liberally and repeat throughout the day.  Protect yourself by wearing long sleeves, pants, a wide-brimmed hat, and sunglasses when outside.  What should I know about heart disease, diabetes, and high blood pressure?  If you are 36-55 years of age, have your blood pressure checked every 3-5 years. If you are 31 years of age or older, have your blood pressure checked every year. You  should have your blood pressure measured twice-once when you are at a hospital or clinic, and once when you are not at a hospital or clinic. Record the average of the two measurements. To check your blood pressure when you are not at a hospital or clinic, you can use: ? An automated blood pressure machine at a pharmacy. ? A home blood pressure monitor.  Talk to your health care provider about your target blood pressure.  If you are between 7-78 years old, ask your health care  provider if you should take aspirin to prevent heart disease.  Have regular diabetes screenings by checking your fasting blood sugar level. ? If you are at a normal weight and have a low risk for diabetes, have this test once every three years after the age of 78. ? If you are overweight and have a high risk for diabetes, consider being tested at a younger age or more often.  A one-time screening for abdominal aortic aneurysm (AAA) by ultrasound is recommended for men aged 34-75 years who are current or former smokers. What should I know about preventing infection? Hepatitis B If you have a higher risk for hepatitis B, you should be screened for this virus. Talk with your health care provider to find out if you are at risk for hepatitis B infection. Hepatitis C Blood testing is recommended for:  Everyone born from [birth year] through 1965.  Anyone with known risk factors for hepatitis C.  Sexually Transmitted Diseases (STDs)  You should be screened each year for STDs including gonorrhea and chlamydia if: ? You are sexually active and are younger than 78 years of age. ? You are older than 78 years of age and your health care provider tells you that you are at risk for this type of infection. ? Your sexual activity has changed since you were last screened and you are at an increased risk for chlamydia or gonorrhea. Ask your health care provider if you are at risk.  Talk with your health care provider about whether you are at high risk of being infected with HIV. Your health care provider may recommend a prescription medicine to help prevent HIV infection.  What else can I do?  Schedule regular health, dental, and eye exams.  Stay current with your vaccines (immunizations).  Do not use any tobacco products, such as cigarettes, chewing tobacco, and e-cigarettes. If you need help quitting, ask your health care provider.  Limit alcohol intake to no more than 2 drinks per day. One drink equals 12  ounces of beer, 5 ounces of wine, or 1 ounces of hard liquor.  Do not use street drugs.  Do not share needles.  Ask your health care provider for help if you need support or information about quitting drugs.  Tell your health care provider if you often feel depressed.  Tell your health care provider if you have ever been abused or do not feel safe at home. This information is not intended to replace advice given to you by your health care provider. Make sure you discuss any questions you have with your health care provider. Document Released: 12/16/2007 Document Revised: 02/16/2016 Document Reviewed: 03/23/2015 Elsevier Interactive Patient Education  [employer].

## 2018-02-13 NOTE — Progress Notes (Signed)
AWV reviewed and agree. Signed:  Crissie Sickles, MD           02/13/2018

## 2018-02-14 LAB — URINE CULTURE
MICRO NUMBER:: 90959787
SPECIMEN QUALITY: ADEQUATE

## 2018-02-15 ENCOUNTER — Encounter: Payer: Medicare Other | Admitting: Family Medicine

## 2018-03-06 ENCOUNTER — Other Ambulatory Visit: Payer: Self-pay | Admitting: Family Medicine

## 2018-03-18 ENCOUNTER — Other Ambulatory Visit: Payer: Self-pay | Admitting: *Deleted

## 2018-03-18 MED ORDER — FINASTERIDE 5 MG PO TABS: 5.0000 mg | ORAL_TABLET | Freq: Every day | ORAL | 0 refills | Status: DC

## 2018-03-20 ENCOUNTER — Other Ambulatory Visit: Payer: Self-pay

## 2018-03-20 ENCOUNTER — Encounter: Payer: Medicare Other | Attending: Registered Nurse | Admitting: Registered Nurse

## 2018-03-20 ENCOUNTER — Encounter: Payer: Self-pay | Admitting: Registered Nurse

## 2018-03-20 ENCOUNTER — Other Ambulatory Visit: Payer: Self-pay | Admitting: Registered Nurse

## 2018-03-20 VITALS — BP 121/79 | HR 62 | Ht 72.0 in | Wt 344.6 lb

## 2018-03-20 DIAGNOSIS — Z79891 Long term (current) use of opiate analgesic: Secondary | ICD-10-CM | POA: Diagnosis not present

## 2018-03-20 DIAGNOSIS — I2510 Atherosclerotic heart disease of native coronary artery without angina pectoris: Secondary | ICD-10-CM | POA: Diagnosis not present

## 2018-03-20 DIAGNOSIS — M109 Gout, unspecified: Secondary | ICD-10-CM | POA: Diagnosis not present

## 2018-03-20 DIAGNOSIS — M792 Neuralgia and neuritis, unspecified: Secondary | ICD-10-CM

## 2018-03-20 DIAGNOSIS — K219 Gastro-esophageal reflux disease without esophagitis: Secondary | ICD-10-CM | POA: Insufficient documentation

## 2018-03-20 DIAGNOSIS — G894 Chronic pain syndrome: Secondary | ICD-10-CM | POA: Diagnosis not present

## 2018-03-20 DIAGNOSIS — G609 Hereditary and idiopathic neuropathy, unspecified: Secondary | ICD-10-CM | POA: Insufficient documentation

## 2018-03-20 DIAGNOSIS — E7800 Pure hypercholesterolemia, unspecified: Secondary | ICD-10-CM | POA: Diagnosis not present

## 2018-03-20 DIAGNOSIS — Z5181 Encounter for therapeutic drug level monitoring: Secondary | ICD-10-CM | POA: Diagnosis not present

## 2018-03-20 DIAGNOSIS — M48062 Spinal stenosis, lumbar region with neurogenic claudication: Secondary | ICD-10-CM | POA: Diagnosis not present

## 2018-03-20 DIAGNOSIS — M1990 Unspecified osteoarthritis, unspecified site: Secondary | ICD-10-CM | POA: Diagnosis not present

## 2018-03-20 DIAGNOSIS — J449 Chronic obstructive pulmonary disease, unspecified: Secondary | ICD-10-CM | POA: Insufficient documentation

## 2018-03-20 DIAGNOSIS — M79672 Pain in left foot: Secondary | ICD-10-CM | POA: Diagnosis present

## 2018-03-20 MED ORDER — OXYCODONE HCL ER 30 MG PO T12A: 30.0000 mg | EXTENDED_RELEASE_TABLET | Freq: Every day | ORAL | 0 refills | Status: DC

## 2018-03-20 NOTE — Progress Notes (Signed)
Subjective:    Patient ID: Tim [name], male    DOB: 1941-06-12, 77 y.o.   MRN: [id number]  HPI: Tim [name] is a 77 year old male who returns for follow up appointment for chronic pain and medication refill. He states his pain is located in his lower back and bilateral feet L>R. He rates his pain 3. His current exercise regime is walking.   Mr. Masden Morphine Equivalent is 56.25 MME. Last UDS was Performed on 09/17/2017, it was consistent. UDS ordered today.   Pain Inventory Average Pain 7 Pain Right Now 3 My pain is sharp, burning, dull, stabbing, tingling and aching  In the last 24 hours, has pain interfered with the following? General activity 8 Relation with others 5 Enjoyment of life 3 What TIME of day is your pain at its worst? night Sleep (in general) Poor  Pain is worse with: walking, bending, standing and some activites Pain improves with: rest, pacing activities, medication and injections Relief from Meds: 7  Mobility use a cane how many minutes can you walk? 3 ability to climb steps?  no do you drive?  yes  Function retired I need assistance with the following:  meal prep, household duties and shopping  Neuro/Psych weakness numbness tremor tingling trouble walking  Prior Studies Any changes since last visit?  no  Physicians involved in your care Any changes since last visit?  no   Family History  Problem Relation Age of Onset  . Hypertension Mother   . Coronary artery disease Mother   . Heart attack Mother   . Neuropathy Mother   . Pulmonary fibrosis Father        asbestosis   Social History   Socioeconomic History  . Marital status: Married    Spouse name: Not on file  . Number of children: 0  . Years of education: 44  . Highest education level: Not on file  Occupational History  . Occupation: Engineer, production    Comment: retired  Scientific laboratory technician  . Financial resource strain: Not on file  . Food insecurity:    Worry: Not on  file    Inability: Not on file  . Transportation needs:    Medical: Not on file    Non-medical: Not on file  Tobacco Use  . Smoking status: Never Smoker  . Smokeless tobacco: Never Used  Substance and Sexual Activity  . Alcohol use: No    Alcohol/week: 0.0 standard drinks  . Drug use: No    Types: Oxycodone  . Sexual activity: Not Currently  Lifestyle  . Physical activity:    Days per week: Not on file    Minutes per session: Not on file  . Stress: Not on file  Relationships  . Social connections:    Talks on phone: Not on file    Gets together: Not on file    Attends religious service: Not on file    Active member of club or organization: Not on file    Attends meetings of clubs or organizations: Not on file    Relationship status: Not on file  Other Topics Concern  . Not on file  Social History Narrative   HSG, [name] - BS, [name] - MS-engineering, 2 years on PhD - [place]. Married - '65 - 49yr/divorced; '76- 3 yrs/divorced; '92 . No children. 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 ACP/Living Will - Yes CPR; long-term Mechanical ventilation as long as he was able  to cognate; ok for long term artificial nutrition. Precondition being able to cognate and not to have too much pain.    Past Surgical History:  Procedure Laterality Date  . AMPUTATION Left 04/11/2013   Procedure: AMPUTATION DIGIT Left 3rd toe;  Surgeon: [name], MD;  Location: [facility];  Service: Orthopedics;  Laterality: Left;  Left 3rd toe amputation at [facility]  . CARDIAC CATHETERIZATION  1997; 03/10/16   1997 Non-obstructive disease.  03/2016 BMS to RCA, with 25% pDiag dz, o/w normal cors per cath 03/07/16.  Cath 11/01/16: in stent restenosis, successful baloon angioplasty.  [name] CARDIAC CATHETERIZATION  12/24/2012   mild < 20% LCx, prox 30% RCA; LVEF 55-65% , moderate pulmonary HTN, moderate AS  . CARDIAC DEFIBRILLATOR PLACEMENT  11/07/2017   [facility] MRI Quad CRT defibrillator  .  CARDIOVASCULAR STRESS TEST  05/11/16 ([facility])   Myocardial perfusion imaging:  No ischemia; scar in apex, global hypokinesis, EF 36%.  . Carotid dopplers  03/09/2016   [facility]: no hemodynamically significant stenosis on either side.  . CHOLECYSTECTOMY    . COLONOSCOPY    . COLONOSCOPY [name] 10/30/2012   Procedure: COLONOSCOPY;  Surgeon: [name], MD;  Location: [facility];  Service: Endoscopy;  Laterality: [name];  . CORONARY ANGIOPLASTY WITH STENT PLACEMENT  03/2016; 04/2017   2017-[facility]: BMS to RCA-pt was placed on Brilinta.  04/2017: DES to RCA.  [name] EYE SURGERY Bilateral cataract  . HEMORRHOID SURGERY    . INTRAOCULAR LENS INSERTION Bilateral   . KNEE SURGERY Right   . LEFT AND RIGHT HEART CATHETERIZATION WITH CORONARY ANGIOGRAM [name] 12/24/2012   Procedure: LEFT AND RIGHT HEART CATHETERIZATION WITH CORONARY ANGIOGRAM;  Surgeon: [name], MD;  Location: [facility] CATH LAB;  Service: Cardiovascular;  Laterality: [name];  . LEG SURGERY Bilateral    lenghtening   . PACEMAKER PLACEMENT  04/13/2016   2nd deg HB after TAVR, pt had DC MDT PPM placed.  [name] SHOULDER ARTHROSCOPY  08/30/2011   Procedure: ARTHROSCOPY SHOULDER;  Surgeon: [name], MD;  Location: [facility];  Service: Orthopedics;  Laterality: Right;  Right Shoulder Arthroscopy, Debridement, and Decompression  . SPINAL CORD STIMULATOR INSERTION [name] 09/10/2015   Procedure: LUMBAR SPINAL CORD STIMULATOR INSERTION;  Surgeon: [name], MD;  Location: [facility];  Service: Neurosurgery;  Laterality: [name];  . 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  R big toe surg due to osteoarth  . TONSILLECTOMY    . traeculectomy Left    eye  . TRANSCATHETER AORTIC VALVE REPLACEMENT, TRANSFEMORAL  04/11/2016  . TRANSESOPHAGEAL ECHOCARDIOGRAM  03/09/2016   [facility]: EF 55-60%, PFO seen with bi-directional shunting, no thrombus in appendage.  . TRANSTHORACIC ECHOCARDIOGRAM  01/2015; 01/2016; 05/18/16; 09/18/16, 05/2017, 08/2017   01/2015 No signif change in  aortic stenosis (moderate).  01/2016 Severe LVH w/small LV cavity, EF 60-65%, grade I diast dysfxn.  05/2016 (s/p TAVR): EF 50-55%, grd I DD, biopros AV good.  08/2016--EF 50-55%, LV septal motion c/w conduction abnl, grd I DD,mild MS,bioprosth aortic valve well seated, w/trace AR. 05/2017 TTE EF 35%. 08/2017-EF 35%, mod diff hypokin LV, grd I DD, biopros AV good.  [name] VITRECTOMY     Past Medical History:  Diagnosis Date  . ASTHMA   . BPH (benign prostatic hypertrophy)   . CAD (coronary artery disease)    Nonobstructive by 12/2012 cath; then 03/2016 he required BMS to RCA ([facility]).  In-stent restenosis on cath 11/01/16, baloon angioplasty successful--pt to take plavix (no  ASA), + eliquis (for PAF).  [name] CATARACT, HX OF   . Chronic combined systolic and diastolic CHF (congestive heart failure) ([place]) 05/2016   Ischemic CM.  [name] Chronic pain syndrome    Lumbar DDD; chronic neuropathic pain (DM); has spinal stimulator and sees pain mgmt MD  . COPD   . Diabetes mellitus type 2 with complications (HCC)    ZOX0R jumped from 5.7% to 6.1% 03/2015---started metformin at that time.  DM 2 dx by fasting gluc criteria 2018.  Has chronic neuropath pain  . Elevated transaminase level 02/2016   Suspect due to fatty liver (documented on u/s 2007). Plan repeat labs 03/2016.  [name] Essential hypertension, benign   . Fatty liver 2007   2007 u/s showed fatty liver with hepatosplenomegaly.  2019 repeat u/s->fatty liver but no cirrhosis or hepatosplenomegaly.  [name] GERD (gastroesophageal reflux disease)   . Glaucoma   . GOUT   . Hallux valgus (acquired)   . HH (hiatus hernia)   . HYPERCHOLESTEROLEMIA-PURE   . Hypogonadism male   . Lumbosacral neuritis   . Lumbosacral spondylosis    Lumbar spinal stenosis with neurogenic claudication--contributes to his chronic pain syndrome  . Normal memory function 08/2014   Neuropsychological testing ([facility]): no cognitive impairment or sign of neurodegenerative disorder.   Likely has adjustment d/o with mixed anxiety/depressed features and may benefit from low dose SNRI.    [name] Normocytic anemia 03/2016   Mild-pt needs ferritin and vit B12 level checked (as of 03/22/16)  . NSTEMI (non-ST elevated myocardial infarction) ([place]) 03/20/2016   BMS to RCA  . Obesity hypoventilation syndrome ([place])   . Obesity, unspecified   . Orthostatic hypotension   . OSA on CPAP    8 cm H2O  . OSTEOARTHRITIS   . Paroxysmal atrial fibrillation ([place]) 2003    (? chronic?) Off anticoag for a while due to falls.  Then apixaban started 12/2014.  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 Ella Bodo): neuropathic pain->diff to treat, failed nucynta, failed spinal stimul trial, oxycontin hs + tramadol + gabap as of 12/2017 f/u Dr. Letta Pate.  . Personal history of colonic adenoma 10/30/2012   Diminutive adenoma, consider repeat 2019 per GI  . Presence of cardiac defibrillator 11/07/2017  . Primary osteoarthritis of both knees    Bone on bone of medial compartments, + signif patellofemoral arth bilat.--supartz inj series started 09/12/17  . PUD (peptic ulcer disease)   . PULMONARY HYPERTENSION, HX OF   . Secondary male hypogonadism 2017  . 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 Shortness of breath    with exertion: much improved s/p TAVR and treatment for CHF.  . Sick sinus syndrome (HCC)    PPM placed  . Thrombocytopenia ([place]) 2018   HSM on 2007 abd u/s---suspect some mild splenic sequestration chronically.  [name] Unspecified glaucoma(365.9)   . Unspecified hereditary and idiopathic peripheral neuropathy approx age 24   bilat LE's, ? left arm, too.  Feet became progressively numb + left foot pain intermittently.  Pt may be trying a spinal stimulator (as of 05/2015)  . VENOUS INSUFFICIENCY    Being followed by Dr. Sharol Given as of 10/2016 for two R LL venous stasis ulcers/skin tears.  Healed as of  10/30/16 f/u with Dr. Sharol Given.  . VENTRAL HERNIA    BP 121/79   Pulse 62   Ht 6' (1.829 m)  Wt (!) 344 lb 9.6 oz (156.3 kg)   SpO2 96%   BMI 46.74 kg/m   Opioid Risk Score:   Fall Risk Score:  `1  Depression screen PHQ 2/9  Depression screen [facility] 2/9 03/20/2018 02/12/2018 01/15/2018 09/17/2017 08/20/2017 07/18/2017 05/22/2017  Decreased Interest 0 0 0 0 0 0 0  Down, Depressed, Hopeless 0 0 0 0 0 0 0  PHQ - 2 Score 0 0 0 0 0 0 0  Altered sleeping - - - - - - -  Tired, decreased energy - - - - - - -  Change in appetite - - - - - - -  Feeling bad or failure about yourself  - - - - - - -  Trouble concentrating - - - - - - -  Moving slowly or fidgety/restless - - - - - - -  Suicidal thoughts - - - - - - -  PHQ-9 Score - - - - - - -  Some recent data might be hidden    Review of Systems  Constitutional: Positive for unexpected weight change.  HENT: Negative.   Eyes: Negative.   Respiratory: Positive for apnea and shortness of breath.   Cardiovascular: Positive for leg swelling.  Endocrine: Negative.   Genitourinary: Positive for dysuria.  Musculoskeletal: Positive for gait problem.  Skin: Negative.   Allergic/Immunologic: Negative.   Neurological: Positive for tremors, weakness and numbness.       Tingling  Hematological: Bruises/bleeds easily.  Psychiatric/Behavioral: Negative.   All other systems reviewed and are negative.      Objective:   Physical Exam  Constitutional: He is oriented to person, place, and time. He appears well-developed and well-nourished.  HENT:  Head: Normocephalic and atraumatic.  Neck: Normal range of motion. Neck supple.  Cardiovascular: Normal rate and regular rhythm.  Pulmonary/Chest: Effort normal and breath sounds normal.  Musculoskeletal:  Normal Muscle Bulk and Muscle Testing Reveals: Upper Extremities: Right: Decreased ROM 45 Degrees and Muscle Strength 4/5 Lower Extremities: Full ROM and Muscle Strength 5/5 Arises from Chair slowly using  cane for support Narrow Based Gait  Neurological: He is alert and oriented to person, place, and time.  Skin: Skin is warm and dry.  Psychiatric: He has a normal mood and affect. His behavior is normal.  Nursing note and vitals reviewed.         Assessment & Plan:  1. Diabetic neuropathy with chronic neuropathic pain: Using Tens Unit: Continuecurrent medication withGabapentin 800 mg QID. 03/20/2018 Refilled:Oxycontin 30 mg one tablet at bedtime #30  and Continue:  Tramadol 37.5/325mg TID as needed.  We will continue the opioid monitoring program, this consists of regular clinic visits, examinations, urine drug screen, pill counts as well as use of [place] Controlled Substance Reporting System. 2. DDD lumbar spine with LBP: Continue to increase activity as tolerated and heat therapy. 03/20/2018 S/P Permanent Spinal Stimulator Placed 09/10/15 via Dr. Maryjean Ka. 3. Bilateral Chronic Knee Pain: No complaints Today. Orthopedist Following.Continue to Monitor. 03/20/2018.  20 minutes of face to face patient care time was spent during this visit. All questions were encouraged and answered.  F/U in 1 month

## 2018-03-25 LAB — TOXASSURE SELECT,+ANTIDEPR,UR

## 2018-04-02 ENCOUNTER — Telehealth: Payer: Self-pay | Admitting: *Deleted

## 2018-04-02 NOTE — Telephone Encounter (Signed)
Urine drug screen for this encounter is consistent for prescribed medication.  Last dose of tramadol was several days before the test.

## 2018-04-17 ENCOUNTER — Encounter: Payer: Self-pay | Admitting: Registered Nurse

## 2018-04-17 ENCOUNTER — Encounter: Payer: Medicare Other | Attending: Registered Nurse | Admitting: Registered Nurse

## 2018-04-17 VITALS — BP 135/70 | HR 64 | Resp 14 | Ht 72.0 in | Wt 358.0 lb

## 2018-04-17 DIAGNOSIS — I2510 Atherosclerotic heart disease of native coronary artery without angina pectoris: Secondary | ICD-10-CM | POA: Diagnosis not present

## 2018-04-17 DIAGNOSIS — M1990 Unspecified osteoarthritis, unspecified site: Secondary | ICD-10-CM | POA: Insufficient documentation

## 2018-04-17 DIAGNOSIS — K219 Gastro-esophageal reflux disease without esophagitis: Secondary | ICD-10-CM | POA: Diagnosis not present

## 2018-04-17 DIAGNOSIS — M109 Gout, unspecified: Secondary | ICD-10-CM | POA: Diagnosis not present

## 2018-04-17 DIAGNOSIS — G894 Chronic pain syndrome: Secondary | ICD-10-CM | POA: Diagnosis not present

## 2018-04-17 DIAGNOSIS — Z5181 Encounter for therapeutic drug level monitoring: Secondary | ICD-10-CM | POA: Diagnosis not present

## 2018-04-17 DIAGNOSIS — M48062 Spinal stenosis, lumbar region with neurogenic claudication: Secondary | ICD-10-CM

## 2018-04-17 DIAGNOSIS — G8929 Other chronic pain: Secondary | ICD-10-CM

## 2018-04-17 DIAGNOSIS — G609 Hereditary and idiopathic neuropathy, unspecified: Secondary | ICD-10-CM | POA: Diagnosis not present

## 2018-04-17 DIAGNOSIS — M79672 Pain in left foot: Secondary | ICD-10-CM | POA: Diagnosis present

## 2018-04-17 DIAGNOSIS — E7800 Pure hypercholesterolemia, unspecified: Secondary | ICD-10-CM | POA: Insufficient documentation

## 2018-04-17 DIAGNOSIS — M25561 Pain in right knee: Secondary | ICD-10-CM

## 2018-04-17 DIAGNOSIS — J449 Chronic obstructive pulmonary disease, unspecified: Secondary | ICD-10-CM | POA: Diagnosis not present

## 2018-04-17 DIAGNOSIS — L97919 Non-pressure chronic ulcer of unspecified part of right lower leg with unspecified severity: Secondary | ICD-10-CM

## 2018-04-17 DIAGNOSIS — M25562 Pain in left knee: Secondary | ICD-10-CM

## 2018-04-17 DIAGNOSIS — I87331 Chronic venous hypertension (idiopathic) with ulcer and inflammation of right lower extremity: Secondary | ICD-10-CM | POA: Insufficient documentation

## 2018-04-17 DIAGNOSIS — Z79891 Long term (current) use of opiate analgesic: Secondary | ICD-10-CM

## 2018-04-17 MED ORDER — OXYCODONE HCL ER 30 MG PO T12A: 30.0000 mg | EXTENDED_RELEASE_TABLET | Freq: Every day | ORAL | 0 refills | Status: DC

## 2018-04-17 NOTE — Progress Notes (Signed)
Subjective:    Patient ID: Tim [name], male    DOB: [birth year], 77 y.o.   MRN: [id number]  HPI: Tim [name] is a 77 year old male who returns for follow up appointment for chronic pain and medication refill. He states his pain is located in his lower back, bilateral knees and  bilateral feet L>R. He rates his pain  4. His current exercise regime is walking.   Mr. [place] Morphine equivalent is 45.00 MME. Last UDS was Performed on 03/20/2018, it was consistent.    Pain Inventory Average Pain 7 Pain Right Now 4 My pain is intermittent, sharp, burning, dull, stabbing, tingling and aching  In the last 24 hours, has pain interfered with the following? General activity 7 Relation with others 6 Enjoyment of life 5 What TIME of day is your pain at its worst? night Sleep (in general) Poor  Pain is worse with: walking, bending, standing and some activites Pain improves with: rest, pacing activities and medication Relief from Meds: 8  Mobility walk with assistance use a cane how many minutes can you walk? 3 ability to climb steps?  no do you drive?  yes  Function retired I need assistance with the following:  meal prep, household duties and shopping  Neuro/Psych weakness numbness tremor tingling trouble walking  Prior Studies Any changes since last visit?  no  Physicians involved in your care Any changes since last visit?  no   Family History  Problem Relation Age of Onset  . Hypertension Mother   . Coronary artery disease Mother   . Heart attack Mother   . Neuropathy Mother   . Pulmonary fibrosis Father        asbestosis   Social History   Socioeconomic History  . Marital status: Married    Spouse name: Not on file  . Number of children: 0  . Years of education: 76  . Highest education level: Not on file  Occupational History  . Occupation: Engineer, production    Comment: retired  Scientific laboratory technician  . Financial resource strain: Not on file  . Food  insecurity:    Worry: Not on file    Inability: Not on file  . Transportation needs:    Medical: Not on file    Non-medical: Not on file  Tobacco Use  . Smoking status: Never Smoker  . Smokeless tobacco: Never Used  Substance and Sexual Activity  . Alcohol use: No    Alcohol/week: 0.0 standard drinks  . Drug use: No    Types: Oxycodone  . Sexual activity: Not Currently  Lifestyle  . Physical activity:    Days per week: Not on file    Minutes per session: Not on file  . Stress: Not on file  Relationships  . Social connections:    Talks on phone: Not on file    Gets together: Not on file    Attends religious service: Not on file    Active member of club or organization: Not on file    Attends meetings of clubs or organizations: Not on file    Relationship status: Not on file  Other Topics Concern  . Not on file  Social History Narrative   HSG, [name] - BS, [name] - MS-engineering, 2 years on PhD - [place]. Married - '65 - 72yr/divorced; '76- 3 yrs/divorced; '92 . No children. 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 ACP/Living Will - Yes CPR; long-term Mechanical ventilation as  long as he was able to cognate; ok for long term artificial nutrition. Precondition being able to cognate and not to have too much pain.    Past Surgical History:  Procedure Laterality Date  . AMPUTATION Left 04/11/2013   Procedure: AMPUTATION DIGIT Left 3rd toe;  Surgeon: [name], MD;  Location: [facility];  Service: Orthopedics;  Laterality: Left;  Left 3rd toe amputation at [facility]  . CARDIAC CATHETERIZATION  1997; 03/10/16   1997 Non-obstructive disease.  03/2016 BMS to RCA, with 25% pDiag dz, o/w normal cors per cath 03/07/16.  Cath 11/01/16: in stent restenosis, successful baloon angioplasty.  [name] CARDIAC CATHETERIZATION  12/24/2012   mild < 20% LCx, prox 30% RCA; LVEF 55-65% , moderate pulmonary HTN, moderate AS  . CARDIAC DEFIBRILLATOR PLACEMENT  11/07/2017   [facility]  Quad CRT defibrillator  . CARDIOVASCULAR STRESS TEST  05/11/16 ([facility])   Myocardial perfusion imaging:  No ischemia; scar in apex, global hypokinesis, EF 36%.  . Carotid dopplers  03/09/2016   [facility]: no hemodynamically significant stenosis on either side.  . CHOLECYSTECTOMY    . COLONOSCOPY    . COLONOSCOPY [name] 10/30/2012   Procedure: COLONOSCOPY;  Surgeon: [name], MD;  Location: [facility];  Service: Endoscopy;  Laterality: [name];  . CORONARY ANGIOPLASTY WITH STENT PLACEMENT  03/2016; 04/2017   2017-[facility]: BMS to RCA-pt was placed on Brilinta.  04/2017: DES to RCA.  [name] EYE SURGERY Bilateral cataract  . HEMORRHOID SURGERY    . INTRAOCULAR LENS INSERTION Bilateral   . KNEE SURGERY Right   . LEFT AND RIGHT HEART CATHETERIZATION WITH CORONARY ANGIOGRAM [name] 12/24/2012   Procedure: LEFT AND RIGHT HEART CATHETERIZATION WITH CORONARY ANGIOGRAM;  Surgeon: [name], MD;  Location: [facility] CATH LAB;  Service: Cardiovascular;  Laterality: [name];  . LEG SURGERY Bilateral    lenghtening   . PACEMAKER PLACEMENT  04/13/2016   2nd deg HB after TAVR, pt had DC MDT PPM placed.  [name] SHOULDER ARTHROSCOPY  08/30/2011   Procedure: ARTHROSCOPY SHOULDER;  Surgeon: [name], MD;  Location: [place] [facility];  Service: Orthopedics;  Laterality: Right;  Right Shoulder Arthroscopy, Debridement, and Decompression  . SPINAL CORD STIMULATOR INSERTION [name] 09/10/2015   Procedure: LUMBAR SPINAL CORD STIMULATOR INSERTION;  Surgeon: [name], MD;  Location: [place] [facility];  Service: Neurosurgery;  Laterality: [name];  . 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  R big toe surg due to osteoarth  . TONSILLECTOMY    . traeculectomy Left    eye  . TRANSCATHETER AORTIC VALVE REPLACEMENT, TRANSFEMORAL  04/11/2016  . TRANSESOPHAGEAL ECHOCARDIOGRAM  03/09/2016   [facility]: EF 55-60%, PFO seen with bi-directional shunting, no thrombus in appendage.  . TRANSTHORACIC ECHOCARDIOGRAM  01/2015; 01/2016; 05/18/16; 09/18/16, 05/2017, 08/2017    01/2015 No signif change in aortic stenosis (moderate).  01/2016 Severe LVH w/small LV cavity, EF 60-65%, grade I diast dysfxn.  05/2016 (s/p TAVR): EF 50-55%, grd I DD, biopros AV good.  08/2016--EF 50-55%, LV septal motion c/w conduction abnl, grd I DD,mild MS,bioprosth aortic valve well seated, w/trace AR. 05/2017 TTE EF 35%. 08/2017-EF 35%, mod diff hypokin LV, grd I DD, biopros AV good.  [name] VITRECTOMY     Past Medical History:  Diagnosis Date  . ASTHMA   . BPH (benign prostatic hypertrophy)   . CAD (coronary artery disease)    Nonobstructive by 12/2012 cath; then 03/2016 he required BMS to RCA ([facility]).  In-stent restenosis on cath 11/01/16, baloon angioplasty  successful--pt to take plavix (no ASA), + eliquis (for PAF).  [name] CATARACT, HX OF   . Chronic combined systolic and diastolic CHF (congestive heart failure) ([place]) 05/2016   Ischemic CM.  [name] Chronic pain syndrome    Lumbar DDD; chronic neuropathic pain (DM); has spinal stimulator and sees pain mgmt MD  . COPD   . Diabetes mellitus type 2 with complications (HCC)    IWP8K jumped from 5.7% to 6.1% 03/2015---started metformin at that time.  DM 2 dx by fasting gluc criteria 2018.  Has chronic neuropath pain  . Elevated transaminase level 02/2016   Suspect due to fatty liver (documented on u/s 2007). Plan repeat labs 03/2016.  [name] Essential hypertension, benign   . Fatty liver 2007   2007 u/s showed fatty liver with hepatosplenomegaly.  2019 repeat u/s->fatty liver but no cirrhosis or hepatosplenomegaly.  [name] GERD (gastroesophageal reflux disease)   . Glaucoma   . GOUT   . Hallux valgus (acquired)   . HH (hiatus hernia)   . HYPERCHOLESTEROLEMIA-PURE   . Hypogonadism male   . Lumbosacral neuritis   . Lumbosacral spondylosis    Lumbar spinal stenosis with neurogenic claudication--contributes to his chronic pain syndrome  . Normal memory function 08/2014   Neuropsychological testing ([facility]): no cognitive impairment or sign of  neurodegenerative disorder.  Likely has adjustment d/o with mixed anxiety/depressed features and may benefit from low dose SNRI.    [name] Normocytic anemia 03/2016   Mild-pt needs ferritin and vit B12 level checked (as of 03/22/16)  . NSTEMI (non-ST elevated myocardial infarction) ([place]) 03/20/2016   BMS to RCA  . Obesity hypoventilation syndrome ([place])   . Obesity, unspecified   . Orthostatic hypotension   . OSA on CPAP    8 cm H2O  . OSTEOARTHRITIS   . Paroxysmal atrial fibrillation ([place]) 2003    (? chronic?) Off anticoag for a while due to falls.  Then apixaban started 12/2014.  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 Ella Bodo): neuropathic pain->diff to treat, failed nucynta, failed spinal stimul trial, oxycontin hs + tramadol + gabap as of 12/2017 f/u Dr. Letta Pate.  . Personal history of colonic adenoma 10/30/2012   Diminutive adenoma, consider repeat 2019 per GI  . Presence of cardiac defibrillator 11/07/2017  . Primary osteoarthritis of both knees    Bone on bone of medial compartments, + signif patellofemoral arth bilat.--supartz inj series started 09/12/17  . PUD (peptic ulcer disease)   . PULMONARY HYPERTENSION, HX OF   . Secondary male hypogonadism 2017  . 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 Shortness of breath    with exertion: much improved s/p TAVR and treatment for CHF.  . Sick sinus syndrome (HCC)    PPM placed  . Thrombocytopenia ([place]) 2018   HSM on 2007 abd u/s---suspect some mild splenic sequestration chronically.  [name] Unspecified glaucoma(365.9)   . Unspecified hereditary and idiopathic peripheral neuropathy approx age 48   bilat LE's, ? left arm, too.  Feet became progressively numb + left foot pain intermittently.  Pt may be trying a spinal stimulator (as of 05/2015)  . VENOUS INSUFFICIENCY    Being followed by Dr. Sharol Given as of 10/2016 for two R LL venous stasis  ulcers/skin tears.  Healed as of 10/30/16 f/u with Dr. Sharol Given.  . VENTRAL HERNIA    BP 135/70 (BP Location: Right Wrist, Patient Position:  Sitting, Cuff Size: Normal)   Pulse 64   Resp 14   Ht 6' (1.829 m)   Wt (!) 358 lb (162.4 kg)   SpO2 97%   BMI 48.55 kg/m   Opioid Risk Score:   Fall Risk Score:  `1  Depression screen PHQ 2/9  Depression screen [facility] 2/9 03/20/2018 02/12/2018 01/15/2018 09/17/2017 08/20/2017 07/18/2017 05/22/2017  Decreased Interest 0 0 0 0 0 0 0  Down, Depressed, Hopeless 0 0 0 0 0 0 0  PHQ - 2 Score 0 0 0 0 0 0 0  Altered sleeping - - - - - - -  Tired, decreased energy - - - - - - -  Change in appetite - - - - - - -  Feeling bad or failure about yourself  - - - - - - -  Trouble concentrating - - - - - - -  Moving slowly or fidgety/restless - - - - - - -  Suicidal thoughts - - - - - - -  PHQ-9 Score - - - - - - -  Some recent data might be hidden    Review of Systems  Constitutional: Negative.   HENT: Negative.   Eyes: Negative.   Respiratory: Negative.   Cardiovascular: Negative.   Gastrointestinal: Negative.   Endocrine: Negative.   Genitourinary: Negative.   Musculoskeletal: Positive for back pain and gait problem.  Skin: Positive for wound.  Allergic/Immunologic: Negative.   Neurological: Positive for tremors, weakness and numbness.       Tingling  Hematological: Bruises/bleeds easily.  All other systems reviewed and are negative.      Objective:   Physical Exam  Constitutional: He is oriented to person, place, and time. He appears well-developed and well-nourished.  Morbid Obesity  HENT:  Head: Normocephalic and atraumatic.  Neck: Normal range of motion. Neck supple.  Cardiovascular: Normal rate and regular rhythm.  Pulmonary/Chest: Effort normal and breath sounds normal.  Musculoskeletal:  Normal Muscle Bulk and Muscle Testing Reveals: Upper Extremities: Right: Decreased ROM 45 Degrees and Muscle Strength 5/5 Left: Full ROM and Muscle  Strength 5/5 Back without spinal tenderness Lower Extremities: Full ROM and Muscle Strength 5/5 Arises from chair slowly Narrow Based gait  Neurological: He is alert and oriented to person, place, and time.  Skin: Skin is warm and dry.  Nursing note and vitals reviewed.         Assessment & Plan:  1. Diabetic neuropathy with chronic neuropathic pain: Using Tens Unit: Continuecurrent medication withGabapentin 800 mg QID. 04/17/2018 Refilled:Oxycontin 30 mg one tabletat bedtime #30and Continue:  Tramadol 37.5/325mg TID as needed.  We will continue the opioid monitoring program, this consists of regular clinic visits, examinations, urine drug screen, pill counts as well as use of [place] Controlled Substance Reporting System. 2. DDD lumbar spine with LBP: Continue to increase activity as tolerated and heat therapy. 04/17/2018 S/P Permanent Spinal Stimulator Placed 09/10/15 via Dr. Maryjean Ka. 3. Bilateral Chronic Knee Pain:Orthopedist Following.Continue to Monitor. 04/18/2018.  20 minutes of face to face patient care time was spent during this visit. All questions were encouraged and answered.  F/U in 1 month

## 2018-04-18 ENCOUNTER — Encounter: Payer: Self-pay | Admitting: Family Medicine

## 2018-05-20 ENCOUNTER — Encounter: Payer: Medicare Other | Attending: Registered Nurse

## 2018-05-20 ENCOUNTER — Encounter: Payer: Self-pay | Admitting: Physical Medicine & Rehabilitation

## 2018-05-20 ENCOUNTER — Ambulatory Visit (HOSPITAL_BASED_OUTPATIENT_CLINIC_OR_DEPARTMENT_OTHER): Payer: Medicare Other | Admitting: Physical Medicine & Rehabilitation

## 2018-05-20 VITALS — BP 121/72 | HR 68 | Ht 72.0 in | Wt 340.0 lb

## 2018-05-20 DIAGNOSIS — M109 Gout, unspecified: Secondary | ICD-10-CM | POA: Diagnosis not present

## 2018-05-20 DIAGNOSIS — G609 Hereditary and idiopathic neuropathy, unspecified: Secondary | ICD-10-CM

## 2018-05-20 DIAGNOSIS — I2510 Atherosclerotic heart disease of native coronary artery without angina pectoris: Secondary | ICD-10-CM | POA: Diagnosis not present

## 2018-05-20 DIAGNOSIS — K219 Gastro-esophageal reflux disease without esophagitis: Secondary | ICD-10-CM | POA: Insufficient documentation

## 2018-05-20 DIAGNOSIS — M1990 Unspecified osteoarthritis, unspecified site: Secondary | ICD-10-CM | POA: Insufficient documentation

## 2018-05-20 DIAGNOSIS — J449 Chronic obstructive pulmonary disease, unspecified: Secondary | ICD-10-CM | POA: Diagnosis not present

## 2018-05-20 DIAGNOSIS — M48062 Spinal stenosis, lumbar region with neurogenic claudication: Secondary | ICD-10-CM

## 2018-05-20 DIAGNOSIS — E7800 Pure hypercholesterolemia, unspecified: Secondary | ICD-10-CM | POA: Insufficient documentation

## 2018-05-20 DIAGNOSIS — M79672 Pain in left foot: Secondary | ICD-10-CM | POA: Diagnosis present

## 2018-05-20 MED ORDER — TRAMADOL-ACETAMINOPHEN 37.5-325 MG PO TABS: 1.0000 | ORAL_TABLET | Freq: Three times a day (TID) | ORAL | 3 refills | Status: DC | PRN

## 2018-05-20 MED ORDER — NALOXONE HCL 4 MG/0.1ML NA LIQD: NASAL | 1 refills | Status: DC

## 2018-05-20 MED ORDER — OXYCODONE HCL ER 30 MG PO T12A: 30.0000 mg | EXTENDED_RELEASE_TABLET | Freq: Every day | ORAL | 0 refills | Status: DC

## 2018-05-20 NOTE — Patient Instructions (Addendum)
BELBUCA (Buprenorphine film) mcg buccal film one at night

## 2018-05-20 NOTE — Progress Notes (Addendum)
Subjective:    Patient ID: Tim [name], male    DOB: 1940-12-17, 77 y.o.   MRN: [id number]  HPI Right lateral flank pain started after an exercise where he was lying prone doing leg curls Patient states that he also has pain when he lifts up his legs and lift up his arms this pain is at the lower aspect of his rib cage on the right side. He has no new numbness or tingling in his arms or legs.  No neck pain. He continues to have his neuropathy pain.  He continues on gabapentin high dose as well as oxycodone extended release 30 mg at night and Ultracet at 1 tablet 3 times per day We discussed other medication options including buprenorphine  Pain Inventory Average Pain 4 Pain Right Now 5 My pain is intermittent, sharp, burning, dull, stabbing, tingling and aching  In the last 24 hours, has pain interfered with the following? General activity 8 Relation with others 3 Enjoyment of life 5 What TIME of day is your pain at its worst? night Sleep (in general) Poor  Pain is worse with: walking, bending, standing and some activites Pain improves with: rest, pacing activities and medication Relief from Meds: 5  Mobility Do you have any goals in this area?  no  Function Do you have any goals in this area?  no  Neuro/Psych weakness numbness tremor tingling trouble walking spasms  Prior Studies Any changes since last visit?  no  Physicians involved in your care Any changes since last visit?  no   Family History  Problem Relation Age of Onset  . Hypertension Mother   . Coronary artery disease Mother   . Heart attack Mother   . Neuropathy Mother   . Pulmonary fibrosis Father        asbestosis   Social History   Socioeconomic History  . Marital status: Married    Spouse name: Not on file  . Number of children: 0  . Years of education: 69  . Highest education level: Not on file  Occupational History  . Occupation: Engineer, production    Comment: retired  Scientific laboratory technician    . Financial resource strain: Not on file  . Food insecurity:    Worry: Not on file    Inability: Not on file  . Transportation needs:    Medical: Not on file    Non-medical: Not on file  Tobacco Use  . Smoking status: Never Smoker  . Smokeless tobacco: Never Used  Substance and Sexual Activity  . Alcohol use: No    Alcohol/week: 0.0 standard drinks  . Drug use: No    Types: Oxycodone  . Sexual activity: Not Currently  Lifestyle  . Physical activity:    Days per week: Not on file    Minutes per session: Not on file  . Stress: Not on file  Relationships  . Social connections:    Talks on phone: Not on file    Gets together: Not on file    Attends religious service: Not on file    Active member of club or organization: Not on file    Attends meetings of clubs or organizations: Not on file    Relationship status: Not on file  Other Topics Concern  . Not on file  Social History Narrative   HSG, [name] - BS, [name] - MS-engineering, 2 years on PhD - [place]. Married - '65 - 12yr/divorced; '76- 3 yrs/divorced; '92 . No children.  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 Lives with wife. ACP/Living Will - Yes CPR; long-term Mechanical ventilation as long as he was able to cognate; ok for long term artificial nutrition. Precondition being able to cognate and not to have too much pain.    Past Surgical History:  Procedure Laterality Date  . AMPUTATION Left 04/11/2013   Procedure: AMPUTATION DIGIT Left 3rd toe;  Surgeon: [name], MD;  Location: [place];  Service: Orthopedics;  Laterality: Left;  Left 3rd toe amputation at [facility]  . CARDIAC CATHETERIZATION  1997; 03/10/16   1997 Non-obstructive disease.  03/2016 BMS to RCA, with 25% pDiag dz, o/w normal cors per cath 03/07/16.  Cath 11/01/16: in stent restenosis, successful baloon angioplasty.  [name] CARDIAC CATHETERIZATION  12/24/2012   mild < 20% LCx, prox 30% RCA; LVEF 55-65% , moderate pulmonary HTN, moderate AS  . CARDIAC  DEFIBRILLATOR PLACEMENT  11/07/2017   [facility] MRI Quad CRT defibrillator  . CARDIOVASCULAR STRESS TEST  05/11/16 ([facility])   Myocardial perfusion imaging:  No ischemia; scar in apex, global hypokinesis, EF 36%.  . Carotid dopplers  03/09/2016   [facility]: no hemodynamically significant stenosis on either side.  . CHOLECYSTECTOMY    . COLONOSCOPY    . COLONOSCOPY [name] 10/30/2012   Procedure: COLONOSCOPY;  Surgeon: [name], MD;  Location: [facility];  Service: Endoscopy;  Laterality: [name];  . CORONARY ANGIOPLASTY WITH STENT PLACEMENT  03/2016; 04/2017   2017-[facility]: BMS to RCA-pt was placed on Brilinta.  04/2017: DES to RCA.  [name] EYE SURGERY Bilateral cataract  . HEMORRHOID SURGERY    . INTRAOCULAR LENS INSERTION Bilateral   . KNEE SURGERY Right   . LEFT AND RIGHT HEART CATHETERIZATION WITH CORONARY ANGIOGRAM [name] 12/24/2012   Procedure: LEFT AND RIGHT HEART CATHETERIZATION WITH CORONARY ANGIOGRAM;  Surgeon: [name], MD;  Location[facility] CATH LAB;  Service: Cardiovascular;  Laterality: [name];  . LEG SURGERY Bilateral    lenghtening   . PACEMAKER PLACEMENT  04/13/2016   2nd deg HB after TAVR, pt had DC MDT PPM placed.  [name] SHOULDER ARTHROSCOPY  08/30/2011   Procedure: ARTHROSCOPY SHOULDER;  Surgeon: [name], MD;  Location: [place];  Service: Orthopedics;  Laterality: Right;  Right Shoulder Arthroscopy, Debridement, and Decompression  . SPINAL CORD STIMULATOR INSERTION [name] 09/10/2015   Procedure: LUMBAR SPINAL CORD STIMULATOR INSERTION;  Surgeon: [name], MD;  Location: [place] [facility];  Service: Neurosurgery;  Laterality: [name];  . 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  R big toe surg due to osteoarth  . TONSILLECTOMY    . traeculectomy Left    eye  . TRANSCATHETER AORTIC VALVE REPLACEMENT, TRANSFEMORAL  04/11/2016  . TRANSESOPHAGEAL ECHOCARDIOGRAM  03/09/2016   [facility]: EF 55-60%, PFO seen with bi-directional shunting, no thrombus in appendage.  . TRANSTHORACIC ECHOCARDIOGRAM   01/2015; 01/2016; 05/18/16; 09/18/16, 05/2017, 08/2017   01/2015 No signif change in aortic stenosis (moderate).  01/2016 Severe LVH w/small LV cavity, EF 60-65%, grade I diast dysfxn.  05/2016 (s/p TAVR): EF 50-55%, grd I DD, biopros AV good.  08/2016--EF 50-55%, LV septal motion c/w conduction abnl, grd I DD,mild MS,bioprosth aortic valve well seated, w/trace AR. 05/2017 TTE EF 35%. 08/2017-EF 35%, mod diff hypokin LV, grd I DD, biopros AV good.  [name] VITRECTOMY     Past Medical History:  Diagnosis Date  . ASTHMA   . BPH (benign prostatic hypertrophy)   . CAD (coronary artery disease)    Nonobstructive by  12/2012 cath; then 03/2016 he required BMS to RCA ([facility]).  In-stent restenosis on cath 11/01/16, baloon angioplasty successful--pt to take plavix (no ASA), + eliquis (for PAF).  [name] CATARACT, HX OF   . Chronic combined systolic and diastolic CHF (congestive heart failure) ([place]) 05/2016   Ischemic CM.  [name] Chronic pain syndrome    Lumbar DDD; chronic neuropathic pain (DM); has spinal stimulator and sees pain mgmt MD  . COPD   . Diabetes mellitus type 2 with complications (HCC)    BHA1P jumped from 5.7% to 6.1% 03/2015---started metformin at that time.  DM 2 dx by fasting gluc criteria 2018.  Has chronic neuropath pain  . Elevated transaminase level 02/2016   Suspect due to fatty liver (documented on u/s 2007). Plan repeat labs 03/2016.  [name] Essential hypertension, benign   . Fatty liver 2007   2007 u/s showed fatty liver with hepatosplenomegaly.  2019 repeat u/s->fatty liver but no cirrhosis or hepatosplenomegaly.  [name] GERD (gastroesophageal reflux disease)   . Glaucoma   . GOUT   . Hallux valgus (acquired)   . HH (hiatus hernia)   . HYPERCHOLESTEROLEMIA-PURE   . Hypogonadism male   . Lumbosacral neuritis   . Lumbosacral spondylosis    Lumbar spinal stenosis with neurogenic claudication--contributes to his chronic pain syndrome  . Normal memory function 08/2014   Neuropsychological testing ([place]  Neuro[facility]): no cognitive impairment or sign of neurodegenerative disorder.  Likely has adjustment d/o with mixed anxiety/depressed features and may benefit from low dose SNRI.    [name] Normocytic anemia 03/2016   Mild-pt needs ferritin and vit B12 level checked (as of 03/22/16)  . NSTEMI (non-ST elevated myocardial infarction) ([place]) 03/20/2016   BMS to RCA  . Obesity hypoventilation syndrome ([place])   . Obesity, unspecified   . Orthostatic hypotension   . OSA on CPAP    8 cm H2O  . OSTEOARTHRITIS   . Paroxysmal atrial fibrillation ([place]) 2003    (? chronic?) Off anticoag for a while due to falls.  Then apixaban started 12/2014.  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 Ella Bodo): neuropathic pain->diff to treat, failed nucynta, failed spinal stimul trial, oxycontin hs + tramadol + gabap as of 12/2017 f/u Dr. Letta Pate.  . Personal history of colonic adenoma 10/30/2012   Diminutive adenoma, consider repeat 2019 per GI  . Presence of cardiac defibrillator 11/07/2017  . Primary osteoarthritis of both knees    Bone on bone of medial compartments, + signif patellofemoral arth bilat.--supartz inj series started 09/12/17  . PUD (peptic ulcer disease)   . PULMONARY HYPERTENSION, HX OF   . Secondary male hypogonadism 2017  . 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 Shortness of breath    with exertion: much improved s/p TAVR and treatment for CHF.  . Sick sinus syndrome (HCC)    PPM placed  . Thrombocytopenia ([place]) 2018   HSM on 2007 abd u/s---suspect some mild splenic sequestration chronically.  [name] Unspecified glaucoma(365.9)   . Unspecified hereditary and idiopathic peripheral neuropathy approx age 71   bilat LE's, ? left arm, too.  Feet became progressively numb + left foot pain intermittently.  Pt may be trying a spinal stimulator (as of 05/2015)  . VENOUS INSUFFICIENCY    Being followed by  Dr. Sharol Given as of 10/2016 for two R LL venous stasis ulcers/skin tears.  Healed as of 10/30/16 f/u  with Dr. Sharol Given.  . VENTRAL HERNIA    There were no vitals taken for this visit.  Opioid Risk Score:   Fall Risk Score:  `1  Depression screen PHQ 2/9  Depression screen [facility] 2/9 03/20/2018 02/12/2018 01/15/2018 09/17/2017 08/20/2017 07/18/2017 05/22/2017  Decreased Interest 0 0 0 0 0 0 0  Down, Depressed, Hopeless 0 0 0 0 0 0 0  PHQ - 2 Score 0 0 0 0 0 0 0  Altered sleeping - - - - - - -  Tired, decreased energy - - - - - - -  Change in appetite - - - - - - -  Feeling bad or failure about yourself  - - - - - - -  Trouble concentrating - - - - - - -  Moving slowly or fidgety/restless - - - - - - -  Suicidal thoughts - - - - - - -  PHQ-9 Score - - - - - - -  Some recent data might be hidden     Review of Systems  Constitutional: Negative.   HENT: Negative.   Eyes: Negative.   Respiratory: Negative.   Cardiovascular: Negative.   Gastrointestinal: Negative.   Endocrine: Negative.   Genitourinary: Negative.   Musculoskeletal: Positive for arthralgias, gait problem and myalgias.  Skin: Negative.   Allergic/Immunologic: Negative.   Neurological: Positive for tremors, weakness and numbness.  Hematological: Negative.   Psychiatric/Behavioral: Negative.   All other systems reviewed and are negative.      Objective:   Physical Exam Nursing notes reviewed   obese male in no acute distress Lumbar spine has no tenderness palpation he is decreased lumbar range of motion 25% flexion extension lateral bending and rotation Negative straight leg raising Motor strength is 4/5 bilateral hip flexor knee extensor ankle dorsiflexors bilaterally. Mood and affect are flat Ambulates with a cane no evidence of toe drag or knee instability.      Assessment & Plan:  1.  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  He has had some improvement with S1 ESI however this has been temporary.  He underwent  spinal cord stimulation trial which was very successful however with implantation he did not achieve the same success and now he is no longer using the system.  He states that he is tried multiple adjustments to his neuromodulation but to no avail.  We discussed that he can seek a second opinion and potentially may get more relief with another system. He declines this at the current time We discussed other long-acting narcotic analgesics for his pain.  We specifically discussed buprenorphine transbuccal film, appropriate dosing would be somewhere between 150 and 450 mcg/day.  He can in fact use this twice a day and likely be able to get rid of the Ultracet assuming that he tolerates it from a side effects profile.  He would like to speak to his pharmacist about the potential costs of this treatment  Indication for chronic opioid: see above Medication and dose: Oxycontin 53m Qhs, Ultracet  1 po TID # pills per month: 30, .90 Last UDS date: 03/20/18 appropriate Opioid Treatment Agreement signed (Y/N): y Opioid Treatment Agreement last reviewed with patient:Oct 2019   NWawonareviewed this encounter (include red flags):

## 2018-05-22 ENCOUNTER — Encounter: Payer: Self-pay | Admitting: Family Medicine

## 2018-05-28 ENCOUNTER — Other Ambulatory Visit: Payer: Self-pay | Admitting: Family Medicine

## 2018-05-28 NOTE — Telephone Encounter (Signed)
RF request for niacin LOV: 02/12/18 Next ov: 06/06/18 Last written: 12/20/17 #180 w/ 1RF  Please advise. Thanks.

## 2018-05-29 ENCOUNTER — Other Ambulatory Visit: Payer: Self-pay | Admitting: Registered Nurse

## 2018-06-06 ENCOUNTER — Encounter: Payer: Self-pay | Admitting: Family Medicine

## 2018-06-06 ENCOUNTER — Ambulatory Visit: Payer: Medicare Other | Admitting: Family Medicine

## 2018-06-06 VITALS — BP 118/68 | HR 70 | Temp 98.2°F | Resp 16 | Ht 72.0 in | Wt 360.0 lb

## 2018-06-06 DIAGNOSIS — E118 Type 2 diabetes mellitus with unspecified complications: Secondary | ICD-10-CM

## 2018-06-06 DIAGNOSIS — K7581 Nonalcoholic steatohepatitis (NASH): Secondary | ICD-10-CM | POA: Diagnosis not present

## 2018-06-06 DIAGNOSIS — D696 Thrombocytopenia, unspecified: Secondary | ICD-10-CM

## 2018-06-06 DIAGNOSIS — D649 Anemia, unspecified: Secondary | ICD-10-CM

## 2018-06-06 DIAGNOSIS — R300 Dysuria: Secondary | ICD-10-CM

## 2018-06-06 MED ORDER — CEPHALEXIN 500 MG PO CAPS: 500.0000 mg | ORAL_CAPSULE | Freq: Four times a day (QID) | ORAL | 0 refills | Status: DC

## 2018-06-06 NOTE — Progress Notes (Signed)
OFFICE VISIT  06/06/2018   CC:  Chief Complaint  Tim [name] presents with  . Follow-up    RCI, pt is not fasting.     HPI:    Tim [name] is a 77 y.o. Caucasian male who presents for 6 mo f/u DM 2, NASH, and chronic normocytic anemia.  He has an extensive cardiac history, for which he is followed by [facility] (Dr. Radford Pax) [place]-->CAD w/stents, PAF/sick sinus syndrome, chronic combined systolic and diastolic HF, and hx of severe aortic stenosis (now s/p TAVR). Also has OHS, OSA, and COPD, for which he is followed by [facility] [place].  He has had a longterm history of morbid obesity, chronic pain syndrome, orthostatic hypotension/dizziness, and peripheral edema.   Interim Hx:  Started getting dysuria 10d ago, +urgency more than usual.  No frequency.  No hematuria.  No urine odor. No abd or flank pain.  No nausea or fever.  Had UTI about 6 mo ago and he says symptoms resolved very well when he was rx'd keflex.  DM; doesn;t monitor glucose at home. I took him off metformin last time I saw him.  He admits to poor diet lately b/c of inability to fix food for himself (too much pain in legs/back lately so he can't stand for long periods) very well so he is forced to eat a lot of takeout and canned food/frozen dinners, etc. No polyuria or polydipsia.  EDEMA: swelling in legs and feet is "better than it has been for years" as is the DOE. This is ever since his pacer was changed to biventricular with ICD.  Was doing resistance training and the next morning he felt upper R abd wall pain.  It is gradually improving. Icy hot, lidocaine patch, and hot/cold no help.  Already taking narcotics for other pain.  ROS: no CP, no dizziness, no palpitations, no focal weakness.   He walks lately using a walker but often times has periods where he can walk with only a cane.  Past Medical History:  Diagnosis Date  . ASTHMA   . BPH (benign prostatic hypertrophy)   . CAD (coronary  artery disease)    Nonobstructive by 12/2012 cath; then 03/2016 he required BMS to RCA ([facility]).  In-stent restenosis on cath 11/01/16, baloon angioplasty successful--pt to take plavix (no ASA), + eliquis (for PAF).  [name] CATARACT, HX OF   . Chronic combined systolic and diastolic CHF (congestive heart failure) ([place]) 05/2016   Ischemic CM.  04/2018 EF 40-45%, grd I DD.  [name] Chronic pain syndrome    Lumbar DDD; chronic neuropathic pain (DM); has spinal stimulator and sees pain mgmt MD  . COPD   . Diabetes mellitus type 2 with complications (HCC)    TKP5W jumped from 5.7% to 6.1% 03/2015---started metformin at that time.  DM 2 dx by fasting gluc criteria 2018.  Has chronic neuropath pain  . Elevated transaminase level 02/2016   Suspect due to fatty liver (documented on u/s 2007). Plan repeat labs 03/2016.  [name] Essential hypertension, benign   . Fatty liver 2007   2007 u/s showed fatty liver with hepatosplenomegaly.  2019 repeat u/s->fatty liver but no cirrhosis or hepatosplenomegaly.  [name] GERD (gastroesophageal reflux disease)   . Glaucoma   . GOUT   . Hallux valgus (acquired)   . HH (hiatus hernia)   . HYPERCHOLESTEROLEMIA-PURE   . Hypogonadism male   . Lumbosacral neuritis   . Lumbosacral spondylosis    Lumbar spinal stenosis with neurogenic claudication--contributes  to his chronic pain syndrome  . Normal memory function 08/2014   Neuropsychological testing ([facility]): no cognitive impairment or sign of neurodegenerative disorder.  Likely has adjustment d/o with mixed anxiety/depressed features and may benefit from low dose SNRI.    [name] Normocytic anemia 03/2016   Mild-pt needs ferritin and vit B12 level checked (as of 03/22/16)  . NSTEMI (non-ST elevated myocardial infarction) ([place]) 03/20/2016   BMS to RCA  . Obesity hypoventilation syndrome ([place])   . Obesity, unspecified   . Orthostatic hypotension   . OSA on CPAP    8 cm H2O  . OSTEOARTHRITIS   . Paroxysmal atrial fibrillation  ([place]) 2003    (? chronic?) Off anticoag for a while due to falls.  Then apixaban started 12/2014.  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 Ella Bodo): neuropathic pain->diff to treat, failed nucynta, failed spinal stimul trial, oxycontin hs + tramadol + gabap as of 12/2017 f/u Dr. Letta Pate.  . Personal history of colonic adenoma 10/30/2012   Diminutive adenoma, consider repeat 2019 per GI  . Presence of cardiac defibrillator 11/07/2017  . Primary osteoarthritis of both knees    Bone on bone of medial compartments, + signif patellofemoral arth bilat.--supartz inj series started 09/12/17  . PUD (peptic ulcer disease)   . PULMONARY HYPERTENSION, HX OF   . Secondary male hypogonadism 2017  . 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 Shortness of breath    with exertion: much improved s/p TAVR and treatment for CHF.  . Sick sinus syndrome (HCC)    PPM placed  . Thrombocytopenia ([place]) 2018   HSM on 2007 abd u/s---suspect some mild splenic sequestration chronically.  [name] Unspecified glaucoma(365.9)   . Unspecified hereditary and idiopathic peripheral neuropathy approx age 56   bilat LE's, ? left arm, too.  Feet became progressively numb + left foot pain intermittently.  Pt may be trying a spinal stimulator (as of 05/2015)  . VENOUS INSUFFICIENCY    Being followed by Dr. Sharol Given as of 10/2016 for two R LL venous stasis ulcers/skin tears.  Healed as of 10/30/16 f/u with Dr. Sharol Given.  . VENTRAL HERNIA     Past Surgical History:  Procedure Laterality Date  . AMPUTATION Left 04/11/2013   Procedure: AMPUTATION DIGIT Left 3rd toe;  Surgeon: [name], MD;  Location: [place];  Service: Orthopedics;  Laterality: Left;  Left 3rd toe amputation at [facility]  . CARDIAC CATHETERIZATION  1997; 03/10/16   1997 Non-obstructive disease.  03/2016 BMS to RCA, with 25% pDiag dz, o/w normal cors per cath 03/07/16.  Cath 11/01/16: in  stent restenosis, successful baloon angioplasty.  [name] CARDIAC CATHETERIZATION  12/24/2012   mild < 20% LCx, prox 30% RCA; LVEF 55-65% , moderate pulmonary HTN, moderate AS  . CARDIAC DEFIBRILLATOR PLACEMENT  11/07/2017   [facility] MRI Quad CRT defibrillator  . CARDIOVASCULAR STRESS TEST  05/11/16 ([facility])   Myocardial perfusion imaging:  No ischemia; scar in apex, global hypokinesis, EF 36%.  . Carotid dopplers  03/09/2016   [facility]: no hemodynamically significant stenosis on either side.  . CHOLECYSTECTOMY    . COLONOSCOPY    . COLONOSCOPY [name] 10/30/2012   Procedure: COLONOSCOPY;  Surgeon: [name], MD;  Location: [facility];  Service: Endoscopy;  Laterality: [name];  . CORONARY ANGIOPLASTY WITH STENT PLACEMENT  03/2016; 04/2017   2017-[facility]: BMS to RCA-pt was placed  on Brilinta.  04/2017: DES to RCA.  [name] EYE SURGERY Bilateral cataract  . HEMORRHOID SURGERY    . INTRAOCULAR LENS INSERTION Bilateral   . KNEE SURGERY Right   . LEFT AND RIGHT HEART CATHETERIZATION WITH CORONARY ANGIOGRAM [name] 12/24/2012   Procedure: LEFT AND RIGHT HEART CATHETERIZATION WITH CORONARY ANGIOGRAM;  Surgeon: [name], MD;  Location: [facility];  Service: Cardiovascular;  Laterality: [name];  . LEG SURGERY Bilateral    lenghtening   . PACEMAKER PLACEMENT  04/13/2016   2nd deg HB after TAVR, pt had DC MDT PPM placed.  [name] SHOULDER ARTHROSCOPY  08/30/2011   Procedure: ARTHROSCOPY SHOULDER;  Surgeon: [name], MD;  Location: [place];  Service: Orthopedics;  Laterality: Right;  Right Shoulder Arthroscopy, Debridement, and Decompression  . SPINAL CORD STIMULATOR INSERTION [name] 09/10/2015   Procedure: LUMBAR SPINAL CORD STIMULATOR INSERTION;  Surgeon: [name], MD;  Location: [place] [facility];  Service: Neurosurgery;  Laterality: [name];  . 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  R big toe surg due to osteoarth  . TONSILLECTOMY    . traeculectomy Left    eye  . TRANSCATHETER AORTIC VALVE REPLACEMENT, TRANSFEMORAL   04/11/2016  . TRANSESOPHAGEAL ECHOCARDIOGRAM  03/09/2016   [facility]: EF 55-60%, PFO seen with bi-directional shunting, no thrombus in appendage.  . TRANSTHORACIC ECHOCARDIOGRAM  01/2015; 01/2016; 05/18/16; 09/18/16, 05/2017, 08/2017   01/2015 No signif change in aortic stenosis (moderate).  01/2016 Severe LVH w/small LV cavity, EF 60-65%, grade I diast dysfxn.  05/2016 (s/p TAVR): EF 50-55%, grd I DD, biopros AV good.  08/2016--EF 50-55%, LV septal motion c/w conduction abnl, grd I DD,mild MS,bioprosth aortic valve well seated, w/trace AR. 05/2017 TTE EF 35%. 08/2017-EF 35%, mod diff hypokin LV, grd I DD, biopros AV good.   . TRANSTHORACIC ECHOCARDIOGRAM     04/2018: EF 40-45%, mod diffuse LV hypokinesis, grd I DD, bioprosth AV well seated, no AS or AR.  [name] VITRECTOMY      Outpatient Medications Prior to Visit  Medication Sig Dispense Refill  . albuterol (PROVENTIL HFA;VENTOLIN HFA) 108 (90 BASE) MCG/ACT inhaler Inhale 2 puffs into the lungs 4 (four) times daily as needed for wheezing or shortness of breath.    . allopurinol (ZYLOPRIM) 300 MG tablet TAKE 1 TABLET BY MOUTH ONCE DAILY WITH FOOD 90 tablet 3  . apixaban (ELIQUIS) 5 MG TABS tablet Take 1 tablet (5 mg total) by mouth 2 (two) times daily. 60 tablet 0  . aspirin EC 81 MG tablet Take 1 tablet by mouth daily.    . B Complex-C (B-COMPLEX WITH VITAMIN C) tablet Take 1 tablet by mouth daily.    . clotrimazole-betamethasone (LOTRISONE) cream apply to affected area twice a day 45 g 5  . ezetimibe (ZETIA) 10 MG tablet take 1 tablet by mouth once daily 90 tablet 3  . finasteride (PROSCAR) 5 MG tablet Take 1 tablet (5 mg total) by mouth daily. Needs office visit prior to any additional refills 30 tablet 0  . fluticasone (FLONASE) 50 MCG/ACT nasal spray Place 2 sprays into both nostrils as needed for allergies or rhinitis.    . Fluticasone-Salmeterol (ADVAIR) 250-50 MCG/DOSE AEPB Inhale 1 puff into the lungs daily as needed (for nasal congestion).     .  gabapentin (NEURONTIN) 800 MG tablet Take 1 tablet (800 mg total) by mouth 4 (four) times daily. 360 tablet 2  . isosorbide mononitrate (IMDUR) 60 MG 24 hr tablet Take 1 tablet (60  mg total) by mouth daily. 90 tablet 1  . metoprolol succinate (TOPROL-XL) 25 MG 24 hr tablet Take 1 tablet by mouth daily. Along w/ 51m to equal 713mdaily    . metoprolol succinate (TOPROL-XL) 50 MG 24 hr tablet Take 50 mg by mouth daily. Take with or immediately following a meal.    . Multiple Vitamin (MULTIVITAMIN) capsule Take 1 capsule by mouth daily.    . mupirocin ointment (BACTROBAN) 2 % Apply 1 application topically 2 (two) times daily. Apply to the affected area 2 times a day 22 g 3  . naloxone (NARCAN) nasal spray 4 mg/0.1 mL Take for overdose of your oxycodone 1 kit 1  . niacin (NIASPAN) 1000 MG CR tablet TAKE 1 TABLET BY MOUTH TWICE A DAY 180 tablet 1  . nitroGLYCERIN (NITROSTAT) 0.4 MG SL tablet     . omeprazole (PRILOSEC) 10 MG capsule TAKE 1 CAPSULE BY MOUTH EVERY DAY 30 capsule 4  . oxyCODONE (OXYCONTIN) 30 MG 12 hr tablet Take 1 tablet (30 mg total) by mouth at bedtime. 30 each 0  . potassium chloride SA (K-DUR,KLOR-CON) 20 MEQ tablet Take 1 tablet (20 mEq total) by mouth daily. 90 tablet 1  . rosuvastatin (CRESTOR) 40 MG tablet take 1 tablet by mouth once daily 15 tablet 0  . sacubitril-valsartan (ENTRESTO) 24-26 MG Take 1 tablet by mouth 2 (two) times daily.    . traMADol-acetaminophen (ULTRACET) 37.5-325 MG tablet Take 1 tablet by mouth 3 (three) times daily as needed. 90 tablet 3  . VITAMIN D, CHOLECALCIFEROL, PO Take 1 tablet by mouth daily.     . cadexomer iodine (IODOSORB) 0.9 % gel Apply 1 application topically daily as needed for wound care. (Tim [name] not taking: Reported on 06/06/2018) 10 g 3  . cephALEXin (KEFLEX) 500 MG capsule Take 1 capsule (500 mg total) by mouth 4 (four) times daily. (Tim [name] not taking: Reported on 06/06/2018) 28 capsule 0  . metFORMIN (GLUCOPHAGE) 500 MG tablet TAKE 1  TABLET BY MOUTH TWICE DAILY WITH FOOD (Tim [name] not taking: Reported on 06/06/2018) 60 tablet 5  . torsemide (DEMADEX) 20 MG tablet TAKE 1 TABLET BY MOUTH ONCE DAILY (Tim [name] not taking: Reported on 06/06/2018) 30 tablet 5  . traMADol-acetaminophen (ULTRACET) 37.5-325 MG tablet TAKE 1 TABLET BY MOUTH THREE TIMES A DAY AS NEEDED (Tim [name] not taking: Reported on 06/06/2018) 90 tablet 0   No facility-administered medications prior to visit.     Allergies  Allergen Reactions  . Brimonidine Tartrate Shortness Of Breath    Alphagan-Shortness of breath  . Brimonidine Tartrate Palpitations  . Brinzolamide Shortness Of Breath    AZOPT- Shortness of breath  . Latanoprost Shortness Of Breath    XALATAN- Shortness of breath  . Nucynta [Tapentadol] Shortness Of Breath  . Sulfa Antibiotics Palpitations  . Timolol Maleate Shortness Of Breath and Other (See Comments)    TIMOPTIC- Aggravated asthma  . Cardizem  [Diltiazem Hcl] Swelling  . Diltiazem Swelling     leg swelling  . Rofecoxib Swelling     VIOXX- leg swelling  . Vancomycin Hives and Other (See Comments)    Possible "Red Man Syndrome"? > hives/blisters  . Codeine Other (See Comments)    Childhood reaction  . Celecoxib Other (See Comments)    CELLBREX-confusion  . Colchicine Diarrhea    diarrhea  . Tape Rash    ROS As per HPI  PE: Blood pressure 118/68, pulse 70, temperature 98.2 F (36.8 C), temperature source Oral, resp. rate 16,  height 6' (1.829 m), weight (!) 360 lb (163.3 kg), SpO2 97 %. Gen: Alert, well appearing.  Tim [name] is oriented to person, place, time, and situation. AFFECT: pleasant, lucid thought and speech. CV: RRR, no m/r/g.   LUNGS: CTA bilat, nonlabored resps, good aeration in all lung fields. EXT: no clubbing or cyanosis.  1-2+ bilat LL pitting edema.    LABS:  Lab Results  Component Value Date   TSH 1.67 02/09/2016   Lab Results  Component Value Date   WBC 7.4 11/28/2017   HGB 12.4 (L) 11/28/2017   HCT  36.5 (L) 11/28/2017   MCV 93.2 11/28/2017   PLT 116.0 (L) 11/28/2017   Lab Results  Component Value Date   IRON 59 05/24/2016   IRON 54 05/24/2016   TIBC 334 05/24/2016   FERRITIN 44.8 05/24/2016   Lab Results  Component Value Date   VITAMINB12 429 05/24/2016    Lab Results  Component Value Date   CREATININE 0.90 11/28/2017   BUN 28 (H) 11/28/2017   NA 140 11/28/2017   K 4.2 11/28/2017   CL 102 11/28/2017   CO2 31 11/28/2017   Lab Results  Component Value Date   ALT 23 08/09/2017   AST 23 08/09/2017   ALKPHOS 57 08/09/2017   BILITOT 0.6 08/09/2017   Lab Results  Component Value Date   CHOL 108 08/09/2017   Lab Results  Component Value Date   HDL 56.30 08/09/2017   Lab Results  Component Value Date   LDLCALC 39 08/09/2017   Lab Results  Component Value Date   TRIG 63.0 08/09/2017   Lab Results  Component Value Date   CHOLHDL 2 08/09/2017   Lab Results  Component Value Date   PSA 0.23 11/23/2015   PSA 0.33 10/07/2014   PSA 0.40 09/25/2013   Lab Results  Component Value Date   HGBA1C 5.7 11/28/2017   CC UA today:-->unfortunately this did not get done before pt left office  IMPRESSION AND PLAN:  1) DM 2;  Poor diet lately.  He is sedentary but this is not new. No home gluc monitoring. HbA1c today.  2) NASH and morbid obesity: unfortunately his diet is pretty poor lately AND due to chronic pain and obesity he has VERY minimal activity level.   Most recent abd u/s was this year and it showed no HSM or cirrhotic changes.   CMET today.    3) Chronic normocytic anemia: anemia of chronic dz plus a fair amount of phlebotomy on regular basis for lab tests. CBC today.  4) Chronic DOE: multifactorial, improved.  He associates this with getting his RV pacer removed and a biventricular pacer/ICD placed. His LE edema is also much improved.  5) UTI: send urine for c/s. Keflex 500 qid x 5d eRx'd.  Of note, wt gain of 10-15 lbs over the last 3 wks is  due to excessive calories and NOT due to volume overload.  An After Visit Summary was printed and given to the Tim [name].  FOLLOW UP: Return in about 6 months (around 12/06/2018) for annual CPE (fasting).  Signed:  Crissie Sickles, MD           06/06/2018

## 2018-06-07 LAB — COMPREHENSIVE METABOLIC PANEL
ALBUMIN: 4.1 g/dL (ref 3.5–5.2)
ALK PHOS: 84 U/L (ref 39–117)
ALT: 14 U/L (ref 0–53)
AST: 19 U/L (ref 0–37)
BILIRUBIN TOTAL: 0.5 mg/dL (ref 0.2–1.2)
BUN: 17 mg/dL (ref 6–23)
CALCIUM: 9.3 mg/dL (ref 8.4–10.5)
CO2: 28 mEq/L (ref 19–32)
Chloride: 107 mEq/L (ref 96–112)
Creatinine, Ser: 0.88 mg/dL (ref 0.40–1.50)
GFR: 88.9 mL/min (ref >60.00)
GLUCOSE: 122 mg/dL — AB (ref 70–99)
Potassium: 4.6 mEq/L (ref 3.5–5.1)
Sodium: 143 mEq/L (ref 135–145)
TOTAL PROTEIN: 6.5 g/dL (ref 6.0–8.3)

## 2018-06-07 LAB — URINE CULTURE
MICRO NUMBER:: 91459678
SPECIMEN QUALITY:: ADEQUATE

## 2018-06-07 LAB — CBC
HCT: 39.2 % (ref 39.0–52.0)
Hemoglobin: 13 g/dL (ref 13.0–17.0)
MCHC: 33.2 g/dL (ref 30.0–36.0)
MCV: 93.5 fl (ref 78.0–100.0)
PLATELETS: 122 10*3/uL — AB (ref 150.0–400.0)
RBC: 4.19 Mil/uL — AB (ref 4.22–5.81)
RDW: 15 % (ref 11.5–15.5)
WBC: 7.6 10*3/uL (ref 4.0–10.5)

## 2018-06-07 LAB — MICROALBUMIN / CREATININE URINE RATIO
Creatinine,U: 129.4 mg/dL
Microalb Creat Ratio: 4.4 mg/g (ref 0.0–30.0)
Microalb, Ur: 5.7 mg/dL — ABNORMAL HIGH (ref 0.0–1.9)

## 2018-06-07 LAB — HEMOGLOBIN A1C: Hgb A1c MFr Bld: 6 % (ref 4.6–6.5)

## 2018-06-19 ENCOUNTER — Encounter: Payer: Self-pay | Admitting: Registered Nurse

## 2018-06-19 ENCOUNTER — Encounter: Payer: Medicare Other | Attending: Registered Nurse | Admitting: Registered Nurse

## 2018-06-19 VITALS — BP 145/81 | HR 78 | Resp 14 | Ht 72.0 in | Wt 360.0 lb

## 2018-06-19 DIAGNOSIS — M109 Gout, unspecified: Secondary | ICD-10-CM | POA: Insufficient documentation

## 2018-06-19 DIAGNOSIS — M25561 Pain in right knee: Secondary | ICD-10-CM

## 2018-06-19 DIAGNOSIS — J449 Chronic obstructive pulmonary disease, unspecified: Secondary | ICD-10-CM | POA: Insufficient documentation

## 2018-06-19 DIAGNOSIS — E7800 Pure hypercholesterolemia, unspecified: Secondary | ICD-10-CM | POA: Insufficient documentation

## 2018-06-19 DIAGNOSIS — M48062 Spinal stenosis, lumbar region with neurogenic claudication: Secondary | ICD-10-CM

## 2018-06-19 DIAGNOSIS — G894 Chronic pain syndrome: Secondary | ICD-10-CM

## 2018-06-19 DIAGNOSIS — M1990 Unspecified osteoarthritis, unspecified site: Secondary | ICD-10-CM | POA: Insufficient documentation

## 2018-06-19 DIAGNOSIS — K219 Gastro-esophageal reflux disease without esophagitis: Secondary | ICD-10-CM | POA: Diagnosis not present

## 2018-06-19 DIAGNOSIS — I2510 Atherosclerotic heart disease of native coronary artery without angina pectoris: Secondary | ICD-10-CM | POA: Diagnosis not present

## 2018-06-19 DIAGNOSIS — Z5181 Encounter for therapeutic drug level monitoring: Secondary | ICD-10-CM | POA: Diagnosis not present

## 2018-06-19 DIAGNOSIS — G609 Hereditary and idiopathic neuropathy, unspecified: Secondary | ICD-10-CM | POA: Diagnosis not present

## 2018-06-19 DIAGNOSIS — G8929 Other chronic pain: Secondary | ICD-10-CM

## 2018-06-19 DIAGNOSIS — Z79891 Long term (current) use of opiate analgesic: Secondary | ICD-10-CM | POA: Diagnosis not present

## 2018-06-19 DIAGNOSIS — M25562 Pain in left knee: Secondary | ICD-10-CM

## 2018-06-19 DIAGNOSIS — M792 Neuralgia and neuritis, unspecified: Secondary | ICD-10-CM

## 2018-06-19 DIAGNOSIS — M79672 Pain in left foot: Secondary | ICD-10-CM | POA: Diagnosis present

## 2018-06-19 MED ORDER — OXYCODONE HCL ER 30 MG PO T12A: 30.0000 mg | EXTENDED_RELEASE_TABLET | Freq: Every day | ORAL | 0 refills | Status: DC

## 2018-06-19 NOTE — Progress Notes (Signed)
Subjective:    Patient ID: Tim [name], male    DOB: [birth year], 77 y.o.   MRN: [id number]  HPI: Tim [name] is a 77 y.o. male who returns for follow up appointment for chronic pain and medication refill. He states his pain is located in his lower back, bilateral knee pain and bilateral feet with numbness and tingling L>R. He rates his pain 4. His current exercise regime is walking.  Also reports a few weeks ago while exercising he had a muscle strian and it is now resolving, he states.  Tim [name] Morphine equivalent is 56.25 MME.  Last UDS was Performed on 03/20/2018, it was consistent.  Pain Inventory Average Pain 7 Pain Right Now 4 My pain is intermittent, sharp, burning, dull, stabbing, tingling and aching  In the last 24 hours, has pain interfered with the following? General activity 6 Relation with others 3 Enjoyment of life 4 What TIME of day is your pain at its worst? night Sleep (in general) Poor  Pain is worse with: walking and standing Pain improves with: medication Relief from Meds: 8  Mobility walk with assistance use a cane ability to climb steps?  no do you drive?  yes  Function retired  Neuro/Psych weakness numbness tremor tingling trouble walking  Prior Studies Any changes since last visit?  no  Physicians involved in your care Any changes since last visit?  no   Family History  Problem Relation Age of Onset  . Hypertension Mother   . Coronary artery disease Mother   . Heart attack Mother   . Neuropathy Mother   . Pulmonary fibrosis Father        asbestosis   Social History   Socioeconomic History  . Marital status: Married    Spouse name: Not on file  . Number of children: 0  . Years of education: 81  . Highest education level: Not on file  Occupational History  . Occupation: Engineer, production    Comment: retired  Scientific laboratory technician  . Financial resource strain: Not on file  . Food insecurity:    Worry: Not on file    Inability:  Not on file  . Transportation needs:    Medical: Not on file    Non-medical: Not on file  Tobacco Use  . Smoking status: Never Smoker  . Smokeless tobacco: Never Used  Substance and Sexual Activity  . Alcohol use: No    Alcohol/week: 0.0 standard drinks  . Drug use: No    Types: Oxycodone  . Sexual activity: Not Currently  Lifestyle  . Physical activity:    Days per week: Not on file    Minutes per session: Not on file  . Stress: Not on file  Relationships  . Social connections:    Talks on phone: Not on file    Gets together: Not on file    Attends religious service: Not on file    Active member of club or organization: Not on file    Attends meetings of clubs or organizations: Not on file    Relationship status: Not on file  Other Topics Concern  . Not on file  Social History Narrative   HSG, [name] - BS, [name] - MS-engineering, 2 years on PhD - [place]. Married - '65 - 92yr/divorced; '76- 3 yrs/divorced; '92 . No children. 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 ACP/Living Will - Yes CPR; long-term Mechanical ventilation as long as he was able to cognate;  ok for long term artificial nutrition. Precondition being able to cognate and not to have too much pain.    Past Surgical History:  Procedure Laterality Date  . AMPUTATION Left 04/11/2013   Procedure: AMPUTATION DIGIT Left 3rd toe;  Surgeon: [name], MD;  Location: [facility];  Service: Orthopedics;  Laterality: Left;  Left 3rd toe amputation at [facility]  . CARDIAC CATHETERIZATION  1997; 03/10/16   1997 Non-obstructive disease.  03/2016 BMS to RCA, with 25% pDiag dz, o/w normal cors per cath 03/07/16.  Cath 11/01/16: in stent restenosis, successful baloon angioplasty.  [name] CARDIAC CATHETERIZATION  12/24/2012   mild < 20% LCx, prox 30% RCA; LVEF 55-65% , moderate pulmonary HTN, moderate AS  . CARDIAC DEFIBRILLATOR PLACEMENT  11/07/2017   [facility] MRI Quad CRT defibrillator  . CARDIOVASCULAR STRESS  TEST  05/11/16 ([facility])   Myocardial perfusion imaging:  No ischemia; scar in apex, global hypokinesis, EF 36%.  . Carotid dopplers  03/09/2016   [facility]: no hemodynamically significant stenosis on either side.  . CHOLECYSTECTOMY    . COLONOSCOPY    . COLONOSCOPY [name] 10/30/2012   Procedure: COLONOSCOPY;  Surgeon: [name], MD;  Location: [facility];  Service: Endoscopy;  Laterality: [name];  . CORONARY ANGIOPLASTY WITH STENT PLACEMENT  03/2016; 04/2017   2017-[facility]: BMS to RCA-pt was placed on Brilinta.  04/2017: DES to RCA.  [name] EYE SURGERY Bilateral cataract  . HEMORRHOID SURGERY    . INTRAOCULAR LENS INSERTION Bilateral   . KNEE SURGERY Right   . LEFT AND RIGHT HEART CATHETERIZATION WITH CORONARY ANGIOGRAM [name] 12/24/2012   Procedure: LEFT AND RIGHT HEART CATHETERIZATION WITH CORONARY ANGIOGRAM;  Surgeon: [name], MD;  Location[facility];  Service: Cardiovascular;  Laterality: [name];  . LEG SURGERY Bilateral    lenghtening   . PACEMAKER PLACEMENT  04/13/2016   2nd deg HB after TAVR, pt had DC MDT PPM placed.  [name] SHOULDER ARTHROSCOPY  08/30/2011   Procedure: ARTHROSCOPY SHOULDER;  Surgeon: [name], MD;  Location: [facility];  Service: Orthopedics;  Laterality: Right;  Right Shoulder Arthroscopy, Debridement, and Decompression  . SPINAL CORD STIMULATOR INSERTION [name] 09/10/2015   Procedure: LUMBAR SPINAL CORD STIMULATOR INSERTION;  Surgeon: [name], MD;  Location: [facility];  Service: Neurosurgery;  Laterality: [name];  . 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  R big toe surg due to osteoarth  . TONSILLECTOMY    . traeculectomy Left    eye  . TRANSCATHETER AORTIC VALVE REPLACEMENT, TRANSFEMORAL  04/11/2016  . TRANSESOPHAGEAL ECHOCARDIOGRAM  03/09/2016   [facility]: EF 55-60%, PFO seen with bi-directional shunting, no thrombus in appendage.  . TRANSTHORACIC ECHOCARDIOGRAM  01/2015; 01/2016; 05/18/16; 09/18/16, 05/2017, 08/2017   01/2015 No signif change in aortic stenosis  (moderate).  01/2016 Severe LVH w/small LV cavity, EF 60-65%, grade I diast dysfxn.  05/2016 (s/p TAVR): EF 50-55%, grd I DD, biopros AV good.  08/2016--EF 50-55%, LV septal motion c/w conduction abnl, grd I DD,mild MS,bioprosth aortic valve well seated, w/trace AR. 05/2017 TTE EF 35%. 08/2017-EF 35%, mod diff hypokin LV, grd I DD, biopros AV good.   . TRANSTHORACIC ECHOCARDIOGRAM     04/2018: EF 40-45%, mod diffuse LV hypokinesis, grd I DD, bioprosth AV well seated, no AS or AR.  [name] VITRECTOMY     Past Medical History:  Diagnosis Date  . ASTHMA   . BPH (benign prostatic hypertrophy)   . CAD (coronary artery disease)  Nonobstructive by 12/2012 cath; then 03/2016 he required BMS to RCA ([facility]).  In-stent restenosis on cath 11/01/16, baloon angioplasty successful--pt to take plavix (no ASA), + eliquis (for PAF).  [name] CATARACT, HX OF   . Chronic combined systolic and diastolic CHF (congestive heart failure) ([place]) 05/2016   Ischemic CM.  04/2018 EF 40-45%, grd I DD.  [name] Chronic pain syndrome    Lumbar DDD; chronic neuropathic pain (DM); has spinal stimulator and sees pain mgmt MD  . COPD   . Diabetes mellitus type 2 with complications (HCC)    FBP1W jumped from 5.7% to 6.1% 03/2015---started metformin at that time.  DM 2 dx by fasting gluc criteria 2018.  Has chronic neuropath pain  . Elevated transaminase level 02/2016   Suspect due to fatty liver (documented on u/s 2007). Plan repeat labs 03/2016.  [name] Essential hypertension, benign   . Fatty liver 2007   2007 u/s showed fatty liver with hepatosplenomegaly.  2019 repeat u/s->fatty liver but no cirrhosis or hepatosplenomegaly.  [name] GERD (gastroesophageal reflux disease)   . Glaucoma   . GOUT   . Hallux valgus (acquired)   . HH (hiatus hernia)   . HYPERCHOLESTEROLEMIA-PURE   . Hypogonadism male   . Lumbosacral neuritis   . Lumbosacral spondylosis    Lumbar spinal stenosis with neurogenic claudication--contributes to his chronic pain syndrome  .  Normal memory function 08/2014   Neuropsychological testing ([facility]): no cognitive impairment or sign of neurodegenerative disorder.  Likely has adjustment d/o with mixed anxiety/depressed features and may benefit from low dose SNRI.    [name] Normocytic anemia 03/2016   Mild-pt needs ferritin and vit B12 level checked (as of 03/22/16)  . NSTEMI (non-ST elevated myocardial infarction) ([place]) 03/20/2016   BMS to RCA  . Obesity hypoventilation syndrome ([place])   . Obesity, unspecified   . Orthostatic hypotension   . OSA on CPAP    8 cm H2O  . OSTEOARTHRITIS   . Paroxysmal atrial fibrillation ([place]) 2003    (? chronic?) Off anticoag for a while due to falls.  Then apixaban started 12/2014.  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 Ella Bodo): neuropathic pain->diff to treat, failed nucynta, failed spinal stimul trial, oxycontin hs + tramadol + gabap as of 12/2017 f/u Dr. Letta Pate.  . Personal history of colonic adenoma 10/30/2012   Diminutive adenoma, consider repeat 2019 per GI  . Presence of cardiac defibrillator 11/07/2017  . Primary osteoarthritis of both knees    Bone on bone of medial compartments, + signif patellofemoral arth bilat.--supartz inj series started 09/12/17  . PUD (peptic ulcer disease)   . PULMONARY HYPERTENSION, HX OF   . Secondary male hypogonadism 2017  . 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 Shortness of breath    with exertion: much improved s/p TAVR and treatment for CHF.  . Sick sinus syndrome (HCC)    PPM placed  . Thrombocytopenia ([place]) 2018   HSM on 2007 abd u/s---suspect some mild splenic sequestration chronically.  [name] Unspecified glaucoma(365.9)   . Unspecified hereditary and idiopathic peripheral neuropathy approx age 26   bilat LE's, ? left arm, too.  Feet became progressively numb + left foot pain intermittently.  Pt may be trying a spinal  stimulator (as of 05/2015)  . VENOUS INSUFFICIENCY    Being followed by Dr. Sharol Given as of 10/2016 for two R LL venous  stasis ulcers/skin tears.  Healed as of 10/30/16 f/u with Dr. Sharol Given.  . VENTRAL HERNIA    BP (!) 145/81   Pulse 78   Resp 14   Ht 6' (1.829 m) Comment: previous  Wt (!) 360 lb (163.3 kg) Comment: previous  SpO2 98%   BMI 48.82 kg/m   Opioid Risk Score:   Fall Risk Score:  `1  Depression screen PHQ 2/9  Depression screen [facility] 2/9 03/20/2018 02/12/2018 01/15/2018 09/17/2017 08/20/2017 07/18/2017 05/22/2017  Decreased Interest 0 0 0 0 0 0 0  Down, Depressed, Hopeless 0 0 0 0 0 0 0  PHQ - 2 Score 0 0 0 0 0 0 0  Altered sleeping - - - - - - -  Tired, decreased energy - - - - - - -  Change in appetite - - - - - - -  Feeling bad or failure about yourself  - - - - - - -  Trouble concentrating - - - - - - -  Moving slowly or fidgety/restless - - - - - - -  Suicidal thoughts - - - - - - -  PHQ-9 Score - - - - - - -  Some recent data might be hidden    Review of Systems  Constitutional: Negative.   HENT: Negative.   Eyes: Negative.   Respiratory: Negative.   Cardiovascular: Negative.   Gastrointestinal: Positive for constipation.  Endocrine: Negative.   Genitourinary: Negative.   Musculoskeletal: Positive for arthralgias, back pain and gait problem.  Skin: Negative.   Allergic/Immunologic: Negative.   Neurological: Positive for tremors, weakness and numbness.       Tingling  Hematological: Negative.   Psychiatric/Behavioral: Negative.   All other systems reviewed and are negative.      Objective:   Physical Exam Vitals signs and nursing note reviewed.  Constitutional:      Appearance: Normal appearance. He is obese.  Neck:     Musculoskeletal: Normal range of motion and neck supple.  Cardiovascular:     Rate and Rhythm: Normal rate and regular rhythm.  Pulmonary:     Breath sounds: Normal breath sounds.  Musculoskeletal:     Comments: Normal Muscle Bulk and  Muscle Testing Reveals:  Upper Extremities: Decreased ROM 45 Degrees and Muscle Strength 5/5 Back without spinal tenderness noted   Lower Extremities: Full ROM and Muscle Strength 5/5 Arises from Table slowly using cane for support Narrow Based Gait   Neurological:     Mental Status: He is alert and oriented to person, place, and time.  Psychiatric:        Mood and Affect: Mood normal.        Behavior: Behavior normal.           Assessment & Plan:  1. Diabetic neuropathy with chronic neuropathic pain: Using Tens Unit: Continuecurrent medication withGabapentin 800 mg QID. 06/19/2018 Refilled:Oxycontin 30 mg one tabletat bedtime #30andContinue:Tramadol 37.5/325mg TID as needed.  We will continue the opioid monitoring program, this consists of regular clinic visits, examinations, urine drug screen, pill counts as well as use of [place] Controlled Substance Reporting System. 2. DDD lumbar spine with LBP: Continue to increase activity as tolerated and heat therapy. 06/19/2018 S/P Permanent Spinal Stimulator Placed 09/10/15 via Dr. Maryjean Ka. 3. Bilateral Chronic Knee Pain:Orthopedist Following.Continue to Monitor. 06/19/2018.  20 minutes of face to face patient care time was spent during this visit. All questions were encouraged and answered.  F/U in 1 month

## 2018-06-30 ENCOUNTER — Other Ambulatory Visit: Payer: Self-pay | Admitting: Family Medicine

## 2018-07-23 ENCOUNTER — Other Ambulatory Visit: Payer: Self-pay

## 2018-07-23 ENCOUNTER — Encounter: Payer: Self-pay | Admitting: Registered Nurse

## 2018-07-23 ENCOUNTER — Encounter: Payer: Medicare Other | Attending: Registered Nurse | Admitting: Registered Nurse

## 2018-07-23 VITALS — BP 114/72 | HR 65 | Ht 72.0 in | Wt 347.0 lb

## 2018-07-23 DIAGNOSIS — M792 Neuralgia and neuritis, unspecified: Secondary | ICD-10-CM

## 2018-07-23 DIAGNOSIS — J449 Chronic obstructive pulmonary disease, unspecified: Secondary | ICD-10-CM | POA: Insufficient documentation

## 2018-07-23 DIAGNOSIS — M109 Gout, unspecified: Secondary | ICD-10-CM | POA: Insufficient documentation

## 2018-07-23 DIAGNOSIS — I2510 Atherosclerotic heart disease of native coronary artery without angina pectoris: Secondary | ICD-10-CM | POA: Diagnosis not present

## 2018-07-23 DIAGNOSIS — M1990 Unspecified osteoarthritis, unspecified site: Secondary | ICD-10-CM | POA: Insufficient documentation

## 2018-07-23 DIAGNOSIS — G609 Hereditary and idiopathic neuropathy, unspecified: Secondary | ICD-10-CM | POA: Insufficient documentation

## 2018-07-23 DIAGNOSIS — Z5181 Encounter for therapeutic drug level monitoring: Secondary | ICD-10-CM

## 2018-07-23 DIAGNOSIS — E7800 Pure hypercholesterolemia, unspecified: Secondary | ICD-10-CM | POA: Diagnosis not present

## 2018-07-23 DIAGNOSIS — M79672 Pain in left foot: Secondary | ICD-10-CM | POA: Diagnosis present

## 2018-07-23 DIAGNOSIS — K219 Gastro-esophageal reflux disease without esophagitis: Secondary | ICD-10-CM | POA: Diagnosis not present

## 2018-07-23 DIAGNOSIS — M48062 Spinal stenosis, lumbar region with neurogenic claudication: Secondary | ICD-10-CM | POA: Diagnosis not present

## 2018-07-23 DIAGNOSIS — Z79891 Long term (current) use of opiate analgesic: Secondary | ICD-10-CM

## 2018-07-23 DIAGNOSIS — G894 Chronic pain syndrome: Secondary | ICD-10-CM | POA: Diagnosis not present

## 2018-07-23 MED ORDER — TRAMADOL-ACETAMINOPHEN 37.5-325 MG PO TABS: 1.0000 | ORAL_TABLET | Freq: Three times a day (TID) | ORAL | 3 refills | Status: DC | PRN

## 2018-07-23 MED ORDER — OXYCODONE HCL ER 30 MG PO T12A: 30.0000 mg | EXTENDED_RELEASE_TABLET | Freq: Every day | ORAL | 0 refills | Status: DC

## 2018-07-23 NOTE — Progress Notes (Signed)
Subjective:    Patient ID: Tim [name], male    DOB: [birth year], 78 y.o.   MRN: [id number]  HPI: Tim [name] is a 78 y.o. male who returns for follow up appointment for chronic pain and medication refill. He states his pain is located in his lower back and bilateral feet pain with tingling and burning L>R. He rates his  Pain 4. His current exercise regime is walking.  [name] equivalent is 56.25   MME. Last UDS was Performed on 03/20/2018, it was consistent. UDS ordered for today.   Pain Inventory Average Pain 7 Pain Right Now 4 My pain is sharp, burning, dull, stabbing, tingling and aching  In the last 24 hours, has pain interfered with the following? General activity 8 Relation with others 9 Enjoyment of life 3 What TIME of day is your pain at its worst? night Sleep (in general) Poor  Pain is worse with: walking, sitting, standing and some activites Pain improves with: rest, pacing activities and medication Relief from Meds: 5  Mobility use a cane how many minutes can you walk? 2 ability to climb steps?  no do you drive?  yes  Function retired I need assistance with the following:  bathing, meal prep, household duties and shopping  Neuro/Psych weakness numbness tremor tingling trouble walking spasms  Prior Studies Any changes since last visit?  no  Physicians involved in your care Any changes since last visit?  no   Family History  Problem Relation Age of Onset  . Hypertension Mother   . Coronary artery disease Mother   . Heart attack Mother   . Neuropathy Mother   . Pulmonary fibrosis Father        asbestosis   Social History   Socioeconomic History  . Marital status: Married    Spouse name: Not on file  . Number of children: 0  . Years of education: 47  . Highest education level: Not on file  Occupational History  . Occupation: Engineer, production    Comment: retired  Scientific laboratory technician  . Financial resource strain: Not on file  . Food  insecurity:    Worry: Not on file    Inability: Not on file  . Transportation needs:    Medical: Not on file    Non-medical: Not on file  Tobacco Use  . Smoking status: Never Smoker  . Smokeless tobacco: Never Used  Substance and Sexual Activity  . Alcohol use: No    Alcohol/week: 0.0 standard drinks  . Drug use: No    Types: Oxycodone  . Sexual activity: Not Currently  Lifestyle  . Physical activity:    Days per week: Not on file    Minutes per session: Not on file  . Stress: Not on file  Relationships  . Social connections:    Talks on phone: Not on file    Gets together: Not on file    Attends religious service: Not on file    Active member of club or organization: Not on file    Attends meetings of clubs or organizations: Not on file    Relationship status: Not on file  Other Topics Concern  . Not on file  Social History Narrative   HSG, [name] - BS, [name] - MS-engineering, 2 years on PhD - [employer]. Married - '65 - 42yr/divorced; '76- 3 yrs/divorced; '92 . No children. 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 ACP/Living Will - Yes CPR; long-term Mechanical ventilation  as long as he was able to cognate; ok for long term artificial nutrition. Precondition being able to cognate and not to have too much pain.    Past Surgical History:  Procedure Laterality Date  . AMPUTATION Left 04/11/2013   Procedure: AMPUTATION DIGIT Left 3rd toe;  Surgeon: [name], MD;  Location: [facility];  Service: Orthopedics;  Laterality: Left;  Left 3rd toe amputation at [facility]  . CARDIAC CATHETERIZATION  1997; 03/10/16   1997 Non-obstructive disease.  03/2016 BMS to RCA, with 25% pDiag dz, o/w normal cors per cath 03/07/16.  Cath 11/01/16: in stent restenosis, successful baloon angioplasty.  [name] CARDIAC CATHETERIZATION  12/24/2012   mild < 20% LCx, prox 30% RCA; LVEF 55-65% , moderate pulmonary HTN, moderate AS  . CARDIAC DEFIBRILLATOR PLACEMENT  11/07/2017   [facility]  Quad CRT defibrillator  . CARDIOVASCULAR STRESS TEST  05/11/16 ([facility])   Myocardial perfusion imaging:  No ischemia; scar in apex, global hypokinesis, EF 36%.  . Carotid dopplers  03/09/2016   [facility]: no hemodynamically significant stenosis on either side.  . CHOLECYSTECTOMY    . COLONOSCOPY    . COLONOSCOPY [name] 10/30/2012   Procedure: COLONOSCOPY;  Surgeon: [name], MD;  Location: [facility];  Service: Endoscopy;  Laterality: [name];  . CORONARY ANGIOPLASTY WITH STENT PLACEMENT  03/2016; 04/2017   2017-[facility]: BMS to RCA-pt was placed on Brilinta.  04/2017: DES to RCA.  [name] EYE SURGERY Bilateral cataract  . HEMORRHOID SURGERY    . INTRAOCULAR LENS INSERTION Bilateral   . KNEE SURGERY Right   . LEFT AND RIGHT HEART CATHETERIZATION WITH CORONARY ANGIOGRAM [name] 12/24/2012   Procedure: LEFT AND RIGHT HEART CATHETERIZATION WITH CORONARY ANGIOGRAM;  Surgeon: [name], MD;  Location: [facility] CATH LAB;  Service: Cardiovascular;  Laterality: [name];  . LEG SURGERY Bilateral    lenghtening   . PACEMAKER PLACEMENT  04/13/2016   2nd deg HB after TAVR, pt had DC MDT PPM placed.  [name] SHOULDER ARTHROSCOPY  08/30/2011   Procedure: ARTHROSCOPY SHOULDER;  Surgeon: [name], MD;  Location: [facility];  Service: Orthopedics;  Laterality: Right;  Right Shoulder Arthroscopy, Debridement, and Decompression  . SPINAL CORD STIMULATOR INSERTION [name] 09/10/2015   Procedure: LUMBAR SPINAL CORD STIMULATOR INSERTION;  Surgeon: [name], MD;  Location: [place] [facility];  Service: Neurosurgery;  Laterality: [name];  . 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  R big toe surg due to osteoarth  . TONSILLECTOMY    . traeculectomy Left    eye  . TRANSCATHETER AORTIC VALVE REPLACEMENT, TRANSFEMORAL  04/11/2016  . TRANSESOPHAGEAL ECHOCARDIOGRAM  03/09/2016   [facility]: EF 55-60%, PFO seen with bi-directional shunting, no thrombus in appendage.  . TRANSTHORACIC ECHOCARDIOGRAM  01/2015; 01/2016; 05/18/16; 09/18/16, 05/2017, 08/2017    01/2015 No signif change in aortic stenosis (moderate).  01/2016 Severe LVH w/small LV cavity, EF 60-65%, grade I diast dysfxn.  05/2016 (s/p TAVR): EF 50-55%, grd I DD, biopros AV good.  08/2016--EF 50-55%, LV septal motion c/w conduction abnl, grd I DD,mild MS,bioprosth aortic valve well seated, w/trace AR. 05/2017 TTE EF 35%. 08/2017-EF 35%, mod diff hypokin LV, grd I DD, biopros AV good.   . TRANSTHORACIC ECHOCARDIOGRAM     04/2018: EF 40-45%, mod diffuse LV hypokinesis, grd I DD, bioprosth AV well seated, no AS or AR.  [name] VITRECTOMY     Past Medical History:  Diagnosis Date  . ASTHMA   . BPH (benign prostatic hypertrophy)   .  CAD (coronary artery disease)    Nonobstructive by 12/2012 cath; then 03/2016 he required BMS to RCA ([facility]).  In-stent restenosis on cath 11/01/16, baloon angioplasty successful--pt to take plavix (no ASA), + eliquis (for PAF).  [name] CATARACT, HX OF   . Chronic combined systolic and diastolic CHF (congestive heart failure) ([place]) 05/2016   Ischemic CM.  04/2018 EF 40-45%, grd I DD.  [name] Chronic pain syndrome    Lumbar DDD; chronic neuropathic pain (DM); has spinal stimulator and sees pain mgmt MD  . COPD   . Diabetes mellitus type 2 with complications (HCC)    YQI3K jumped from 5.7% to 6.1% 03/2015---started metformin at that time.  DM 2 dx by fasting gluc criteria 2018.  Has chronic neuropath pain  . Elevated transaminase level 02/2016   Suspect due to fatty liver (documented on u/s 2007). Plan repeat labs 03/2016.  [name] Essential hypertension, benign   . Fatty liver 2007   2007 u/s showed fatty liver with hepatosplenomegaly.  2019 repeat u/s->fatty liver but no cirrhosis or hepatosplenomegaly.  [name] GERD (gastroesophageal reflux disease)   . Glaucoma   . GOUT   . Hallux valgus (acquired)   . HH (hiatus hernia)   . HYPERCHOLESTEROLEMIA-PURE   . Hypogonadism male   . Lumbosacral neuritis   . Lumbosacral spondylosis    Lumbar spinal stenosis with neurogenic  claudication--contributes to his chronic pain syndrome  . Normal memory function 08/2014   Neuropsychological testing ([facility]): no cognitive impairment or sign of neurodegenerative disorder.  Likely has adjustment d/o with mixed anxiety/depressed features and may benefit from low dose SNRI.    [name] Normocytic anemia 03/2016   Mild-pt needs ferritin and vit B12 level checked (as of 03/22/16)  . NSTEMI (non-ST elevated myocardial infarction) ([place]) 03/20/2016   BMS to RCA  . Obesity hypoventilation syndrome ([place])   . Obesity, unspecified   . Orthostatic hypotension   . OSA on CPAP    8 cm H2O  . OSTEOARTHRITIS   . Paroxysmal atrial fibrillation ([place]) 2003    (? chronic?) Off anticoag for a while due to falls.  Then apixaban started 12/2014.  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 Ella Bodo): neuropathic pain->diff to treat, failed nucynta, failed spinal stimul trial, oxycontin hs + tramadol + gabap as of 12/2017 f/u Dr. Letta Pate.  . Personal history of colonic adenoma 10/30/2012   Diminutive adenoma, consider repeat 2019 per GI  . Presence of cardiac defibrillator 11/07/2017  . Primary osteoarthritis of both knees    Bone on bone of medial compartments, + signif patellofemoral arth bilat.--supartz inj series started 09/12/17  . PUD (peptic ulcer disease)   . PULMONARY HYPERTENSION, HX OF   . Secondary male hypogonadism 2017  . 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 Shortness of breath    with exertion: much improved s/p TAVR and treatment for CHF.  . Sick sinus syndrome (HCC)    PPM placed  . Thrombocytopenia ([place]) 2018   HSM on 2007 abd u/s---suspect some mild splenic sequestration chronically.  [name] Unspecified glaucoma(365.9)   . Unspecified hereditary and idiopathic peripheral neuropathy approx age 57   bilat LE's, ? left arm, too.  Feet became progressively numb +  left foot pain intermittently.  Pt may be trying a spinal stimulator (as of 05/2015)  . VENOUS INSUFFICIENCY    Being followed by Dr. Sharol Given as  of 10/2016 for two R LL venous stasis ulcers/skin tears.  Healed as of 10/30/16 f/u with Dr. Sharol Given.  . VENTRAL HERNIA    BP 114/72   Pulse 65   Ht 6' (1.829 m)   Wt (!) 347 lb (157.4 kg)   SpO2 96%   BMI 47.06 kg/m   Opioid Risk Score:   Fall Risk Score:  `1  Depression screen PHQ 2/9  Depression screen [facility] 2/9 03/20/2018 02/12/2018 01/15/2018 09/17/2017 08/20/2017 07/18/2017 05/22/2017  Decreased Interest 0 0 0 0 0 0 0  Down, Depressed, Hopeless 0 0 0 0 0 0 0  PHQ - 2 Score 0 0 0 0 0 0 0  Altered sleeping - - - - - - -  Tired, decreased energy - - - - - - -  Change in appetite - - - - - - -  Feeling bad or failure about yourself  - - - - - - -  Trouble concentrating - - - - - - -  Moving slowly or fidgety/restless - - - - - - -  Suicidal thoughts - - - - - - -  PHQ-9 Score - - - - - - -  Some recent data might be hidden   Review of Systems  Constitutional: Positive for unexpected weight change.  HENT: Negative.   Eyes: Negative.   Respiratory: Negative.   Cardiovascular: Negative.   Gastrointestinal: Negative.   Endocrine: Negative.   Genitourinary: Negative.   Musculoskeletal: Negative.   Skin: Negative.   Allergic/Immunologic: Negative.   Neurological: Negative.   Hematological: Negative.   Psychiatric/Behavioral: Negative.   All other systems reviewed and are negative.      Objective:   Physical Exam Constitutional:      Appearance: Normal appearance. He is obese.  Neck:     Musculoskeletal: Normal range of motion and neck supple.  Cardiovascular:     Rate and Rhythm: Normal rate and regular rhythm.     Pulses: Normal pulses.     Heart sounds: Normal heart sounds.  Pulmonary:     Effort: Pulmonary effort is normal.     Breath sounds: Normal breath sounds.  Musculoskeletal:     Comments: Normal Muscle Bulk and Muscle  Testing Reveals:  Upper Extremities: Right: Decreased ROM 90 Degrees  and Muscle Strength 4/5  Left: Full ROM and Muscle Strength 5/5 Lumbar Paraspinal Tenderness: L-4-L-5 Mainly Left Side Lower Extremities: Full ROM and Muscle Strength 5/5 Arises from Table slowly using cane for support Narrow Based Gait   Skin:    General: Skin is warm and dry.  Neurological:     Mental Status: He is alert and oriented to person, place, and time.  Psychiatric:        Mood and Affect: Mood normal.        Behavior: Behavior normal.           Assessment & Plan:  1. Diabetic neuropathy with chronic neuropathic pain: Using Tens Unit: Continuecurrent medication withGabapentin 800 mg QID.07/23/2018 Refilled:Oxycontin 30 mg one tabletat bedtime #30andContinue:Tramadol 37.5/325mg TID as needed.  We will continue the opioid monitoring program, this consists of regular clinic visits, examinations, urine drug screen, pill counts as well as use of [place] Controlled Substance Reporting System. 2. DDD lumbar spine with LBP: Continue to increase activity as tolerated and heat therapy.07/23/2018 S/P Permanent Spinal Stimulator Placed 09/10/15 via Dr. Maryjean Ka. 3. Bilateral Chronic Knee Pain:Orthopedist Following.Continue to Monitor. 07/23/2018.  20 minutes of face to face patient  care time was spent during this visit. All questions were encouraged and answered.  F/U in 1 month

## 2018-07-28 LAB — TOXASSURE SELECT,+ANTIDEPR,UR

## 2018-07-30 ENCOUNTER — Telehealth: Payer: Self-pay | Admitting: *Deleted

## 2018-07-30 NOTE — Telephone Encounter (Signed)
Urine drug screen for this encounter is consistent for prescribed medication oxycodone.  Tramadol was reported taken the night before but it or its metabolites are not found in his urine test.

## 2018-08-20 ENCOUNTER — Encounter: Payer: Medicare Other | Attending: Registered Nurse | Admitting: Registered Nurse

## 2018-08-20 ENCOUNTER — Encounter: Payer: Self-pay | Admitting: Registered Nurse

## 2018-08-20 VITALS — BP 105/61 | HR 61 | Resp 14 | Ht 72.0 in | Wt 353.0 lb

## 2018-08-20 DIAGNOSIS — K219 Gastro-esophageal reflux disease without esophagitis: Secondary | ICD-10-CM | POA: Insufficient documentation

## 2018-08-20 DIAGNOSIS — M109 Gout, unspecified: Secondary | ICD-10-CM | POA: Diagnosis not present

## 2018-08-20 DIAGNOSIS — Z79891 Long term (current) use of opiate analgesic: Secondary | ICD-10-CM

## 2018-08-20 DIAGNOSIS — G609 Hereditary and idiopathic neuropathy, unspecified: Secondary | ICD-10-CM | POA: Diagnosis not present

## 2018-08-20 DIAGNOSIS — E7800 Pure hypercholesterolemia, unspecified: Secondary | ICD-10-CM | POA: Insufficient documentation

## 2018-08-20 DIAGNOSIS — M792 Neuralgia and neuritis, unspecified: Secondary | ICD-10-CM

## 2018-08-20 DIAGNOSIS — M79672 Pain in left foot: Secondary | ICD-10-CM | POA: Diagnosis present

## 2018-08-20 DIAGNOSIS — J449 Chronic obstructive pulmonary disease, unspecified: Secondary | ICD-10-CM | POA: Diagnosis not present

## 2018-08-20 DIAGNOSIS — Z5181 Encounter for therapeutic drug level monitoring: Secondary | ICD-10-CM | POA: Diagnosis not present

## 2018-08-20 DIAGNOSIS — G894 Chronic pain syndrome: Secondary | ICD-10-CM | POA: Diagnosis not present

## 2018-08-20 DIAGNOSIS — M1990 Unspecified osteoarthritis, unspecified site: Secondary | ICD-10-CM | POA: Diagnosis not present

## 2018-08-20 DIAGNOSIS — M48062 Spinal stenosis, lumbar region with neurogenic claudication: Secondary | ICD-10-CM

## 2018-08-20 DIAGNOSIS — I2510 Atherosclerotic heart disease of native coronary artery without angina pectoris: Secondary | ICD-10-CM | POA: Insufficient documentation

## 2018-08-20 MED ORDER — GABAPENTIN 800 MG PO TABS: 800.0000 mg | ORAL_TABLET | Freq: Four times a day (QID) | ORAL | 2 refills | Status: DC

## 2018-08-20 MED ORDER — OXYCODONE HCL 10 MG PO TABS: 10.0000 mg | ORAL_TABLET | Freq: Three times a day (TID) | ORAL | 0 refills | Status: DC | PRN

## 2018-08-20 NOTE — Progress Notes (Signed)
Subjective:    Patient ID: Tim [name], male    DOB: Oct 08, 1940, 78 y.o.   MRN: [id number]  HPI: Tim [name] is a 78 y.o. male who returns for follow up appointment for chronic pain and medication refill. He states his pain is located in his lower back and left foot. She rates her pain 6. Her current exercise regime is walking, performing stretching exercises and using his Bow-flex three days a week for 10 minutes.   Mr. Kantner reports increase breakthrough pain during the daytime, Mr. Lovie Macadamia asked to return to his Oxycodone. He will continue with Oxycontin at [facility] and we will resume Oxycodone next month, he verbalizes understanding.   Mr. Mormile Morphine equivalent is 56.25 MME. Last UDS was Performed on 07/23/2018, it was consistent.     Pain Inventory Average Pain 8 Pain Right Now 6 My pain is intermittent, sharp, burning, dull, stabbing, tingling and aching  In the last 24 hours, has pain interfered with the following? General activity 8 Relation with others 5 Enjoyment of life 7 What TIME of day is your pain at its worst? night Sleep (in general) Poor  Pain is worse with: walking, bending, standing and some activites Pain improves with: rest, pacing activities and medication Relief from Meds: 6  Mobility walk with assistance use a cane ability to climb steps?  no do you drive?  yes  Function retired I need assistance with the following:  bathing, household duties and shopping  Neuro/Psych weakness numbness tremor tingling trouble walking  Prior Studies Any changes since last visit?  no  Physicians involved in your care Any changes since last visit?  no   Family History  Problem Relation Age of Onset  . Hypertension Mother   . Coronary artery disease Mother   . Heart attack Mother   . Neuropathy Mother   . Pulmonary fibrosis Father        asbestosis   Social History   Socioeconomic History  . Marital status: Married    Spouse name: Not on  file  . Number of children: 0  . Years of education: 32  . Highest education level: Not on file  Occupational History  . Occupation: Engineer, production    Comment: retired  Scientific laboratory technician  . Financial resource strain: Not on file  . Food insecurity:    Worry: Not on file    Inability: Not on file  . Transportation needs:    Medical: Not on file    Non-medical: Not on file  Tobacco Use  . Smoking status: Never Smoker  . Smokeless tobacco: Never Used  Substance and Sexual Activity  . Alcohol use: No    Alcohol/week: 0.0 standard drinks  . Drug use: No    Types: Oxycodone  . Sexual activity: Not Currently  Lifestyle  . Physical activity:    Days per week: Not on file    Minutes per session: Not on file  . Stress: Not on file  Relationships  . Social connections:    Talks on phone: Not on file    Gets together: Not on file    Attends religious service: Not on file    Active member of club or organization: Not on file    Attends meetings of clubs or organizations: Not on file    Relationship status: Not on file  Other Topics Concern  . Not on file  Social History Narrative   HSG, [name] - BS, [place] - MS-engineering, 2  years on PhD - [employer]. Married - '65 - 76yr/divorced; '76- 3 yrs/divorced; '92 . No children. 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 ACP/Living Will - Yes CPR; long-term Mechanical ventilation as long as he was able to cognate; ok for long term artificial nutrition. Precondition being able to cognate and not to have too much pain.    Past Surgical History:  Procedure Laterality Date  . AMPUTATION Left 04/11/2013   Procedure: AMPUTATION DIGIT Left 3rd toe;  Surgeon: [name] MD;  [facility]  Service: Orthopedics;  Laterality: Left;  Left 3rd toe amputation at [facility]  . CARDIAC CATHETERIZATION  1997; 03/10/16   1997 Non-obstructive disease.  03/2016 BMS to RCA, with 25% pDiag dz, o/w normal cors per cath 03/07/16.  Cath 11/01/16:  in stent restenosis, successful baloon angioplasty.  .Marland KitchenCARDIAC CATHETERIZATION  12/24/2012   mild < 20% LCx, prox 30% RCA; LVEF 55-65% , moderate pulmonary HTN, moderate AS  . CARDIAC DEFIBRILLATOR PLACEMENT  11/07/2017   [facility] MRI Quad CRT defibrillator  . CARDIOVASCULAR STRESS TEST  05/11/16 ([facility])   Myocardial perfusion imaging:  No ischemia; scar in apex, global hypokinesis, EF 36%.  . Carotid dopplers  03/09/2016   [facility]: no hemodynamically significant stenosis on either side.  . CHOLECYSTECTOMY    . COLONOSCOPY    . COLONOSCOPY [name] 10/30/2012   Procedure: COLONOSCOPY;  Surgeon: CGatha Mayer MD;  Location: [facility];  Service: Endoscopy;  Laterality: [name];  . CORONARY ANGIOPLASTY WITH STENT PLACEMENT  03/2016; 04/2017   2017-[facility]: BMS to RCA-pt was placed on Brilinta.  04/2017: DES to RCA.  .Marland KitchenEYE SURGERY Bilateral cataract  . HEMORRHOID SURGERY    . INTRAOCULAR LENS INSERTION Bilateral   . KNEE SURGERY Right   . LEFT AND RIGHT HEART CATHETERIZATION WITH CORONARY ANGIOGRAM [name] 12/24/2012   Procedure: LEFT AND RIGHT HEART CATHETERIZATION WITH CORONARY ANGIOGRAM;  Surgeon: Peter M JMartinique MD;  [facility];  Service: Cardiovascular;  Laterality: [name];  . LEG SURGERY Bilateral    lenghtening   . PACEMAKER PLACEMENT  04/13/2016   2nd deg HB after TAVR, pt had DC MDT PPM placed.  .Marland KitchenSHOULDER ARTHROSCOPY  08/30/2011   Procedure: ARTHROSCOPY SHOULDER;  Surgeon: [name] MD;  [facility]  Service: Orthopedics;  Laterality: Right;  Right Shoulder Arthroscopy, Debridement, and Decompression  . SPINAL CORD STIMULATOR INSERTION [name] 09/10/2015   Procedure: LUMBAR SPINAL CORD STIMULATOR INSERTION;  Surgeon: PClydell Hakim MD;  [facility];  Service: Neurosurgery;  Laterality: [name];  . 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  R big toe surg due to osteoarth  . TONSILLECTOMY    . traeculectomy Left    eye  . TRANSCATHETER AORTIC VALVE REPLACEMENT,  TRANSFEMORAL  04/11/2016  . TRANSESOPHAGEAL ECHOCARDIOGRAM  03/09/2016   [facility]: EF 55-60%, PFO seen with bi-directional shunting, no thrombus in appendage.  . TRANSTHORACIC ECHOCARDIOGRAM  01/2015; 01/2016; 05/18/16; 09/18/16, 05/2017, 08/2017   01/2015 No signif change in aortic stenosis (moderate).  01/2016 Severe LVH w/small LV cavity, EF 60-65%, grade I diast dysfxn.  05/2016 (s/p TAVR): EF 50-55%, grd I DD, biopros AV good.  08/2016--EF 50-55%, LV septal motion c/w conduction abnl, grd I DD,mild MS,bioprosth aortic valve well seated, w/trace AR. 05/2017 TTE EF 35%. 08/2017-EF 35%, mod diff hypokin LV, grd I DD, biopros AV good.   . TRANSTHORACIC ECHOCARDIOGRAM     04/2018: EF 40-45%, mod diffuse LV hypokinesis, grd  I DD, bioprosth AV well seated, no AS or AR.  [name] VITRECTOMY     Past Medical History:  Diagnosis Date  . ASTHMA   . BPH (benign prostatic hypertrophy)   . CAD (coronary artery disease)    Nonobstructive by 12/2012 cath; then 03/2016 he required BMS to RCA ([facility]).  In-stent restenosis on cath 11/01/16, baloon angioplasty successful--pt to take plavix (no ASA), + eliquis (for PAF).  [name] CATARACT, HX OF   . Chronic combined systolic and diastolic CHF (congestive heart failure) ([place]) 05/2016   Ischemic CM.  04/2018 EF 40-45%, grd I DD.  [name] Chronic pain syndrome    Lumbar DDD; chronic neuropathic pain (DM); has spinal stimulator and sees pain mgmt MD  . COPD   . Diabetes mellitus type 2 with complications (HCC)    ONG2X jumped from 5.7% to 6.1% 03/2015---started metformin at that time.  DM 2 dx by fasting gluc criteria 2018.  Has chronic neuropath pain  . Elevated transaminase level 02/2016   Suspect due to fatty liver (documented on u/s 2007). Plan repeat labs 03/2016.  [name] Essential hypertension, benign   . Fatty liver 2007   2007 u/s showed fatty liver with hepatosplenomegaly.  2019 repeat u/s->fatty liver but no cirrhosis or hepatosplenomegaly.  [name] GERD (gastroesophageal reflux disease)     . Glaucoma   . GOUT   . Hallux valgus (acquired)   . HH (hiatus hernia)   . HYPERCHOLESTEROLEMIA-PURE   . Hypogonadism male   . Lumbosacral neuritis   . Lumbosacral spondylosis    Lumbar spinal stenosis with neurogenic claudication--contributes to his chronic pain syndrome  . Normal memory function 08/2014   Neuropsychological testing ([facility]): no cognitive impairment or sign of neurodegenerative disorder.  Likely has adjustment d/o with mixed anxiety/depressed features and may benefit from low dose SNRI.    [name] Normocytic anemia 03/2016   Mild-pt needs ferritin and vit B12 level checked (as of 03/22/16)  . NSTEMI (non-ST elevated myocardial infarction) ([place]) 03/20/2016   BMS to RCA  . Obesity hypoventilation syndrome ([place])   . Obesity, unspecified   . Orthostatic hypotension   . OSA on CPAP    8 cm H2O  . OSTEOARTHRITIS   . Paroxysmal atrial fibrillation ([place]) 2003    (? chronic?) Off anticoag for a while due to falls.  Then apixaban started 12/2014.  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 Ella Bodo): neuropathic pain->diff to treat, failed nucynta, failed spinal stimul trial, oxycontin hs + tramadol + gabap as of 12/2017 f/u Dr. Letta Pate.  . Personal history of colonic adenoma 10/30/2012   Diminutive adenoma, consider repeat 2019 per GI  . Presence of cardiac defibrillator 11/07/2017  . Primary osteoarthritis of both knees    Bone on bone of medial compartments, + signif patellofemoral arth bilat.--supartz inj series started 09/12/17  . PUD (peptic ulcer disease)   . PULMONARY HYPERTENSION, HX OF   . Secondary male hypogonadism 2017  . 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 Shortness of breath    with exertion: much improved s/p TAVR and treatment for CHF.  . Sick sinus syndrome (HCC)    PPM placed  . Thrombocytopenia ([place]) 2018   HSM on 2007 abd  u/s---suspect some mild splenic sequestration chronically.  [name] Unspecified glaucoma(365.9)   . Unspecified hereditary and idiopathic peripheral neuropathy approx age 27   bilat LE's, ?  left arm, too.  Feet became progressively numb + left foot pain intermittently.  Pt may be trying a spinal stimulator (as of 05/2015)  . VENOUS INSUFFICIENCY    Being followed by Dr. Sharol Given as of 10/2016 for two R LL venous stasis ulcers/skin tears.  Healed as of 10/30/16 f/u with Dr. Sharol Given.  . VENTRAL HERNIA    BP 105/61 (BP Location: Left Wrist, Patient Position: Sitting, Cuff Size: Normal)   Pulse 61   Resp 14   Ht 6' (1.829 m)   Wt (!) 353 lb (160.1 kg)   SpO2 97%   BMI 47.88 kg/m   Opioid Risk Score:   Fall Risk Score:  `1  Depression screen PHQ 2/9  Depression screen [facility] 2/9 03/20/2018 02/12/2018 01/15/2018 09/17/2017 08/20/2017 07/18/2017 05/22/2017  Decreased Interest 0 0 0 0 0 0 0  Down, Depressed, Hopeless 0 0 0 0 0 0 0  PHQ - 2 Score 0 0 0 0 0 0 0  Altered sleeping - - - - - - -  Tired, decreased energy - - - - - - -  Change in appetite - - - - - - -  Feeling bad or failure about yourself  - - - - - - -  Trouble concentrating - - - - - - -  Moving slowly or fidgety/restless - - - - - - -  Suicidal thoughts - - - - - - -  PHQ-9 Score - - - - - - -  Some recent data might be hidden    Review of Systems  Constitutional: Negative.   HENT: Negative.   Eyes: Negative.   Cardiovascular: Negative.   Gastrointestinal: Negative.   Endocrine: Negative.   Genitourinary: Negative.   Musculoskeletal: Positive for arthralgias, back pain and gait problem.  Skin: Negative.   Allergic/Immunologic: Negative.   Neurological: Positive for tremors, weakness and numbness.       Tingling  Psychiatric/Behavioral: Negative.   All other systems reviewed and are negative.      Objective:   Physical Exam Vitals signs and nursing note reviewed.  Constitutional:      Appearance: Normal appearance.  Neck:      Musculoskeletal: Normal range of motion and neck supple.  Cardiovascular:     Rate and Rhythm: Normal rate and regular rhythm.     Pulses: Normal pulses.     Heart sounds: Normal heart sounds.  Pulmonary:     Effort: Pulmonary effort is normal.     Breath sounds: Normal breath sounds.  Musculoskeletal:     Comments: Normal Muscle Bulk and Muscle Testing Reveals:  Upper Extremities: Full  ROM and Muscle Strength 5/5 Back without spinal tenderness noted Lower Extremities: Full ROM and Muscle Strength 5/5 Arises from Table slowly using cane for support Narrow Based Gait   Skin:    General: Skin is warm and dry.  Neurological:     Mental Status: He is alert and oriented to person, place, and time.  Psychiatric:        Mood and Affect: Mood normal.        Behavior: Behavior normal.           Assessment & Plan:  1. Diabetic neuropathy with chronic neuropathic pain: Using Tens Unit: Continuecurrent medication withGabapentin 800 mg QID.08/20/2017 ContinueOxycontin 30 mg one tabletat bedtime #30 this month. Next Month we will change therapy to:  RX: Oxycodone 10 mg one tablet three times daily as needed may take an extra tablet when  pain is sever only 10 days a month #100 andContinue:Tramadol 37.5/325mg TID as needed.  We will continue the opioid monitoring program, this consists of regular clinic visits, examinations, urine drug screen, pill counts as well as use of [place] Controlled Substance Reporting System. 2. DDD lumbar spine with LBP: Continue to increase activity as tolerated and heat therapy.08/20/2018. S/P Permanent Spinal Stimulator Placed 09/10/15 via Dr. Maryjean Ka. 3. Bilateral Chronic Knee Pain: No complaints Today. Orthopedist Following.Continue to Monitor. 08/20/2018.  20 minutes of face to face patient care time was spent during this visit. All questions were encouraged and answered.  F/U in 1 month

## 2018-08-21 ENCOUNTER — Other Ambulatory Visit: Payer: Self-pay | Admitting: Family Medicine

## 2018-08-21 NOTE — Telephone Encounter (Signed)
RF request for Potassium LOV: 06/06/18 Next ov: none Last written: 11/20/17 #90 w/ 1RF  Please advise, thanks.

## 2018-09-16 ENCOUNTER — Other Ambulatory Visit: Payer: Self-pay

## 2018-09-16 ENCOUNTER — Encounter: Payer: Self-pay | Admitting: Family Medicine

## 2018-09-16 ENCOUNTER — Ambulatory Visit: Payer: Medicare Other | Admitting: Family Medicine

## 2018-09-16 VITALS — BP 112/66 | HR 83 | Temp 97.8°F | Resp 14 | Ht 72.0 in | Wt 335.2 lb

## 2018-09-16 DIAGNOSIS — I5042 Chronic combined systolic (congestive) and diastolic (congestive) heart failure: Secondary | ICD-10-CM | POA: Diagnosis not present

## 2018-09-16 DIAGNOSIS — R6889 Other general symptoms and signs: Secondary | ICD-10-CM

## 2018-09-16 DIAGNOSIS — R42 Dizziness and giddiness: Secondary | ICD-10-CM

## 2018-09-16 DIAGNOSIS — E118 Type 2 diabetes mellitus with unspecified complications: Secondary | ICD-10-CM

## 2018-09-16 DIAGNOSIS — R609 Edema, unspecified: Secondary | ICD-10-CM

## 2018-09-16 DIAGNOSIS — E7800 Pure hypercholesterolemia, unspecified: Secondary | ICD-10-CM

## 2018-09-16 DIAGNOSIS — R600 Localized edema: Secondary | ICD-10-CM

## 2018-09-16 NOTE — Progress Notes (Signed)
OFFICE VISIT  09/16/2018   CC:  Chief Complaint  Patient presents with  . Follow-up    RCI, pt is fasting   HPI:    Patient is a 78 y.o. Caucasian male who presents for 3 mo f/u DM 2, HLD, and peripheral edema.  Of note, he has an extensive cardiac history, for which he is followed by [name] (Dr. Radford Pax) [place]-->CAD w/stents, PAF/, chronic combined systolic and diastolic HF, and hx of severe aortic stenosis (now s/p TAVR). He was recently upgraded to a biventricular ICD for primary prevention.   Also has OHS, OSA, and COPD, for which he is followed by [facility] [place].  He has had a longterm history of morbid obesity, chronic pain syndrome (has pain mgmt MD), orthostatic hypotension/dizziness, and peripheral edema.  INTERIM HX:  Was feeling fine up until the last couple days: having recurrent orthostatic dizziness, HA, cold all the time. No fever.  Past several weeks has been drinking much less than he used to, says he is not thirsty. Eating plenty of food.   He had been off his torsemide for a while at the direction of his cardiologist b/c fluid balance good. Increased LE swelling last 4-5 days so he restarted 67m torsemide bid.   No chest pain.  Having more difficulty breathing intermittently-for approx the last week or so.  Says pulse ox high 90s on RA lately. No melena/hematochezia.  Stools used to be hard/inconsistent but now brown, soft, formed and twice a day. No falls. According to our scales, he has lost 18 lbs in the last 26 days but his wt 26 days ago is suspected to be inaccurate.   DM: "fairly good" per pt, but he did not elaborate b/c he wanted to talk more about the problem above.  HLD: taking statin fine. Past Medical History:  Diagnosis Date  . ASTHMA   . BPH (benign prostatic hypertrophy)   . CAD (coronary artery disease)    Nonobstructive by 12/2012 cath; then 03/2016 he required BMS to RCA ([facility]).  In-stent restenosis on cath  11/01/16, baloon angioplasty successful--pt to take plavix (no ASA), + eliquis (for PAF).  .Marland KitchenCATARACT, HX OF   . Chronic combined systolic and diastolic CHF (congestive heart failure) ([place] 05/2016   Ischemic CM.  04/2018 EF 40-45%, grd I DD.  .Marland KitchenChronic pain syndrome    Lumbar DDD; chronic neuropathic pain (DM); has spinal stimulator and sees pain mgmt MD  . COPD   . Diabetes mellitus type 2 with complications (HCC)    [id number] from 5.7% to 6.1% 03/2015---started metformin at that time.  DM 2 dx by fasting gluc criteria 2018.  Has chronic neuropath pain  . Elevated transaminase level 02/2016   Suspect due to fatty liver (documented on u/s 2007). Plan repeat labs 03/2016.  .Marland KitchenEssential hypertension, benign   . Fatty liver 2007   2007 u/s showed fatty liver with hepatosplenomegaly.  2019 repeat u/s->fatty liver but no cirrhosis or hepatosplenomegaly.  .Marland KitchenGERD (gastroesophageal reflux disease)   . Glaucoma   . GOUT   . Hallux valgus (acquired)   . HH (hiatus hernia)   . HYPERCHOLESTEROLEMIA-PURE   . Hypogonadism male   . Lumbosacral neuritis   . Lumbosacral spondylosis    Lumbar spinal stenosis with neurogenic claudication--contributes to his chronic pain syndrome  . Normal memory function 08/2014   Neuropsychological testing ([facility]): no cognitive impairment or sign of neurodegenerative disorder.  Likely has adjustment d/o  with mixed anxiety/depressed features and may benefit from low dose SNRI.    [name] Normocytic anemia 03/2016   Mild-pt needs ferritin and vit B12 level checked (as of 03/22/16)  . NSTEMI (non-ST elevated myocardial infarction) ([place]) 03/20/2016   BMS to RCA  . Obesity hypoventilation syndrome ([place])   . Obesity, unspecified   . Orthostatic hypotension   . OSA on CPAP    8 cm H2O  . OSTEOARTHRITIS   . Paroxysmal atrial fibrillation ([place]) 2003    (? chronic?) Off anticoag for a while due to falls.  Then apixaban started 12/2014.  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 Ella Bodo): neuropathic pain->diff to treat, failed nucynta, failed spinal stimul trial, oxycontin hs + tramadol + gabap as of 12/2017 f/u Dr. Letta Pate.  . Personal history of colonic adenoma 10/30/2012   Diminutive adenoma, consider repeat 2019 per GI  . Presence of cardiac defibrillator 11/07/2017  . Primary osteoarthritis of both knees    Bone on bone of medial compartments, + signif patellofemoral arth bilat.--supartz inj series started 09/12/17  . PUD (peptic ulcer disease)   . PULMONARY HYPERTENSION, HX OF   . Secondary male hypogonadism 2017  . 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 Shortness of breath    with exertion: much improved s/p TAVR and treatment for CHF.  . Sick sinus syndrome (HCC)    PPM placed  . Thrombocytopenia ([place]) 2018   HSM on 2007 abd u/s---suspect some mild splenic sequestration chronically.  [name] Unspecified glaucoma(365.9)   . Unspecified hereditary and idiopathic peripheral neuropathy approx age 53   bilat LE's, ? left arm, too.  Feet became progressively numb + left foot pain intermittently.  Pt may be trying a spinal stimulator (as of 05/2015)  . VENOUS INSUFFICIENCY    Being followed by Dr. Sharol Given as of 10/2016 for two R LL venous stasis ulcers/skin tears.  Healed as of 10/30/16 f/u with Dr. Sharol Given.  . VENTRAL HERNIA     Past Surgical History:  Procedure Laterality Date  . AMPUTATION Left 04/11/2013   Procedure: AMPUTATION DIGIT Left 3rd toe;  Surgeon: [name], MD;  Location: [facility];  Service: Orthopedics;  Laterality: Left;  Left 3rd toe amputation at [facility]  . CARDIAC CATHETERIZATION  1997; 03/10/16   1997 Non-obstructive disease.  03/2016 BMS to RCA, with 25% pDiag dz, o/w normal cors per cath 03/07/16.  Cath 11/01/16: in stent restenosis, successful baloon angioplasty.  [name] CARDIAC CATHETERIZATION  12/24/2012   mild < 20% LCx, prox 30% RCA; LVEF  55-65% , moderate pulmonary HTN, moderate AS  . CARDIAC DEFIBRILLATOR PLACEMENT  11/07/2017   [facility] MRI Quad CRT defibrillator  . CARDIOVASCULAR STRESS TEST  05/11/16 ([facility])   Myocardial perfusion imaging:  No ischemia; scar in apex, global hypokinesis, EF 36%.  . Carotid dopplers  03/09/2016   [facility]: no hemodynamically significant stenosis on either side.  . CHOLECYSTECTOMY    . COLONOSCOPY    . COLONOSCOPY [name] 10/30/2012   Procedure: COLONOSCOPY;  Surgeon: [name], MD;  Location: [facility];  Service: Endoscopy;  Laterality: [name];  . CORONARY ANGIOPLASTY WITH STENT PLACEMENT  03/2016; 04/2017   2017-[facility]: BMS to RCA-pt was placed on Brilinta.  04/2017: DES to RCA.  [name] EYE SURGERY Bilateral cataract  . HEMORRHOID SURGERY    . INTRAOCULAR LENS INSERTION Bilateral   . KNEE SURGERY  Right   . LEFT AND RIGHT HEART CATHETERIZATION WITH CORONARY ANGIOGRAM [name] 12/24/2012   Procedure: LEFT AND RIGHT HEART CATHETERIZATION WITH CORONARY ANGIOGRAM;  Surgeon: [name], MD;  Location[name];  Service: Cardiovascular;  Laterality: [name];  . LEG SURGERY Bilateral    lenghtening   . PACEMAKER PLACEMENT  04/13/2016   2nd deg HB after TAVR, pt had DC MDT PPM placed.  [name] SHOULDER ARTHROSCOPY  08/30/2011   Procedure: ARTHROSCOPY SHOULDER;  Surgeon: [name], MD;  Location: [facility];  Service: Orthopedics;  Laterality: Right;  Right Shoulder Arthroscopy, Debridement, and Decompression  . SPINAL CORD STIMULATOR INSERTION [name] 09/10/2015   Procedure: LUMBAR SPINAL CORD STIMULATOR INSERTION;  Surgeon: [name], MD;  Location: [facility];  Service: Neurosurgery;  Laterality: [name];  . 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  R big toe surg due to osteoarth  . TONSILLECTOMY    . traeculectomy Left    eye  . TRANSCATHETER AORTIC VALVE REPLACEMENT, TRANSFEMORAL  04/11/2016  . TRANSESOPHAGEAL ECHOCARDIOGRAM  03/09/2016   [facility]: EF 55-60%, PFO seen with bi-directional shunting, no  thrombus in appendage.  . TRANSTHORACIC ECHOCARDIOGRAM  01/2015; 01/2016; 05/18/16; 09/18/16, 05/2017, 08/2017   01/2015 No signif change in aortic stenosis (moderate).  01/2016 Severe LVH w/small LV cavity, EF 60-65%, grade I diast dysfxn.  05/2016 (s/p TAVR): EF 50-55%, grd I DD, biopros AV good.  08/2016--EF 50-55%, LV septal motion c/w conduction abnl, grd I DD,mild MS,bioprosth aortic valve well seated, w/trace AR. 05/2017 TTE EF 35%. 08/2017-EF 35%, mod diff hypokin LV, grd I DD, biopros AV good.   . TRANSTHORACIC ECHOCARDIOGRAM     04/2018: EF 40-45%, mod diffuse LV hypokinesis, grd I DD, bioprosth AV well seated, no AS or AR.  [name] VITRECTOMY      Outpatient Medications Prior to Visit  Medication Sig Dispense Refill  . albuterol (PROVENTIL HFA;VENTOLIN HFA) 108 (90 BASE) MCG/ACT inhaler Inhale 2 puffs into the lungs 4 (four) times daily as needed for wheezing or shortness of breath.    . allopurinol (ZYLOPRIM) 300 MG tablet TAKE 1 TABLET BY MOUTH ONCE DAILY WITH FOOD 90 tablet 3  . apixaban (ELIQUIS) 5 MG TABS tablet Take 1 tablet (5 mg total) by mouth 2 (two) times daily. 60 tablet 0  . aspirin EC 81 MG tablet Take 1 tablet by mouth daily.    . B Complex-C (B-COMPLEX WITH VITAMIN C) tablet Take 1 tablet by mouth daily.    . clotrimazole-betamethasone (LOTRISONE) cream apply to affected area twice a day 45 g 5  . ezetimibe (ZETIA) 10 MG tablet take 1 tablet by mouth once daily 90 tablet 3  . finasteride (PROSCAR) 5 MG tablet Take 1 tablet (5 mg total) by mouth daily. 90 tablet 1  . fluticasone (FLONASE) 50 MCG/ACT nasal spray Place 2 sprays into both nostrils as needed for allergies or rhinitis.    . Fluticasone-Salmeterol (ADVAIR) 250-50 MCG/DOSE AEPB Inhale 1 puff into the lungs daily as needed (for nasal congestion).     . gabapentin (NEURONTIN) 800 MG tablet Take 1 tablet (800 mg total) by mouth 4 (four) times daily. 360 tablet 2  . isosorbide mononitrate (IMDUR) 60 MG 24 hr tablet TAKE 1  TABLET BY MOUTH EVERY DAY 90 tablet 1  . KLOR-CON M20 20 MEQ tablet TAKE 1 TABLET EVERY DAY 90 tablet 1  . losartan (COZAAR) 50 MG tablet     . metoprolol succinate (TOPROL-XL) 25 MG  24 hr tablet Take 1 tablet by mouth daily. Along w/ 68m to equal 717mdaily    . metoprolol succinate (TOPROL-XL) 50 MG 24 hr tablet Take 50 mg by mouth daily. Take with or immediately following a meal.    . Multiple Vitamin (MULTIVITAMIN) capsule Take 1 capsule by mouth daily.    . mupirocin ointment (BACTROBAN) 2 % Apply 1 application topically 2 (two) times daily. Apply to the affected area 2 times a day 22 g 3  . naloxone (NARCAN) nasal spray 4 mg/0.1 mL Take for overdose of your oxycodone 1 kit 1  . niacin (NIASPAN) 1000 MG CR tablet TAKE 1 TABLET BY MOUTH TWICE A DAY 180 tablet 1  . nitroGLYCERIN (NITROSTAT) 0.4 MG SL tablet     . omeprazole (PRILOSEC) 10 MG capsule TAKE 1 CAPSULE BY MOUTH EVERY DAY 90 capsule 1  . Oxycodone HCl 10 MG TABS Take 1 tablet (10 mg total) by mouth 3 (three) times daily as needed. May take an extra tablet when pain is severe 10 days out the month. 100 tablet 0  . rosuvastatin (CRESTOR) 40 MG tablet take 1 tablet by mouth once daily 15 tablet 0  . sacubitril-valsartan (ENTRESTO) 24-26 MG Take 1 tablet by mouth 2 (two) times daily.    . Marland Kitchenorsemide (DEMADEX) 20 MG tablet 20 mg daily as needed    . traMADol-acetaminophen (ULTRACET) 37.5-325 MG tablet Take 1 tablet by mouth 3 (three) times daily as needed. 90 tablet 3  . VITAMIN D, CHOLECALCIFEROL, PO Take 1 tablet by mouth daily.     . cephALEXin (KEFLEX) 500 MG capsule Take 1 capsule (500 mg total) by mouth 4 (four) times daily. (Patient not taking: Reported on 09/16/2018) 20 capsule 0   No facility-administered medications prior to visit.     Allergies  Allergen Reactions  . Brimonidine Tartrate Shortness Of Breath    Alphagan-Shortness of breath  . Brimonidine Tartrate Palpitations  . Brinzolamide Shortness Of Breath    AZOPT-  Shortness of breath  . Latanoprost Shortness Of Breath    XALATAN- Shortness of breath  . Nucynta [Tapentadol] Shortness Of Breath  . Sulfa Antibiotics Palpitations  . Timolol Maleate Shortness Of Breath and Other (See Comments)    TIMOPTIC- Aggravated asthma  . Cardizem  [Diltiazem Hcl] Swelling  . Diltiazem Swelling     leg swelling  . Rofecoxib Swelling     VIOXX- leg swelling  . Vancomycin Hives and Other (See Comments)    Possible "Red Man Syndrome"? > hives/blisters  . Codeine Other (See Comments)    Childhood reaction  . Celecoxib Other (See Comments)    CELLBREX-confusion  . Colchicine Diarrhea    diarrhea  . Tape Rash    ROS As per HPI  PE: Blood pressure 112/66, pulse 83, temperature 97.8 F (36.6 C), temperature source Oral, resp. rate 14, height 6' (1.829 m), weight (!) 335 lb 3.2 oz (152 kg), SpO2 97 %. Gen: Alert, tired-appearing.  Patient is oriented to person, place, time, and situation. AFFECT: pleasant, lucid thought and speech. ENZOX:WRUEno injection, icteris, swelling, or exudate.  EOMI, PERRLA. Mouth: lips without lesion/swelling.  Oral mucosa pink and moist. Oropharynx without erythema, exudate, or swelling.  NECK: no LAD, mass, or carotid bruit. CV: RRR, soft systolic murmur, without rub or gallop. Chest is clear, no wheezing or rales. Normal symmetric air entry throughout both lung fields. No chest wall deformities or tenderness. ABD: soft, NT/ND EXT: no clubbing or cyanosis.  1+  pitting edema in pretibial areas bilat.     LABS:  Lab Results  Component Value Date   TSH 1.67 02/09/2016   Lab Results  Component Value Date   WBC 7.6 06/06/2018   HGB 13.0 06/06/2018   HCT 39.2 06/06/2018   MCV 93.5 06/06/2018   PLT 122.0 (L) 06/06/2018   Lab Results  Component Value Date   CREATININE 0.88 06/06/2018   BUN 17 06/06/2018   NA 143 06/06/2018   K 4.6 06/06/2018   CL 107 06/06/2018   CO2 28 06/06/2018   Lab Results  Component Value Date    ALT 14 06/06/2018   AST 19 06/06/2018   ALKPHOS 84 06/06/2018   BILITOT 0.5 06/06/2018   Lab Results  Component Value Date   CHOL 108 08/09/2017   Lab Results  Component Value Date   HDL 56.30 08/09/2017   Lab Results  Component Value Date   LDLCALC 39 08/09/2017   Lab Results  Component Value Date   TRIG 63.0 08/09/2017   Lab Results  Component Value Date   CHOLHDL 2 08/09/2017   Lab Results  Component Value Date   PSA 0.23 11/23/2015   PSA 0.33 10/07/2014   PSA 0.40 09/25/2013   Lab Results  Component Value Date   HGBA1C 6.0 06/06/2018     IMPRESSION AND PLAN:  1) Orthostatic dizziness (very common/recurrent problem for him): likely hypovolemic (poor fluid intake lately, recent 5 days of taking torsemide, a bit of increased stooling for the last few weeks but NOT diarrhea until in the office today).   Plan: cut use of dulcolax in half, stop torsemide for now, increase fluid intake to approx 2.5 L per day (low Na and low sugar if possible), and check CBC, BMET, TSH, T3 total, and free T4.  2) DM 2: historically well controlled. HbA1c today. Lytes/Cr today.  3) HLD: tolerating statin.  Not fasting. An After Visit Summary was printed and given to the patient.  4) Peripheral edema: stable at this time.  An After Visit Summary was printed and given to the patient.  FOLLOW UP: Return in about 1 week (around 09/23/2018) for f/u dizziness/fatigue.  Signed:  Crissie Sickles, MD           09/16/2018

## 2018-09-17 LAB — T4, FREE: Free T4: 0.83 ng/dL (ref 0.60–1.60)

## 2018-09-17 LAB — CBC
HEMATOCRIT: 38.3 % — AB (ref 39.0–52.0)
Hemoglobin: 12.9 g/dL — ABNORMAL LOW (ref 13.0–17.0)
MCHC: 33.7 g/dL (ref 30.0–36.0)
MCV: 92.7 fl (ref 78.0–100.0)
Platelets: 126 10*3/uL — ABNORMAL LOW (ref 150.0–400.0)
RBC: 4.13 Mil/uL — ABNORMAL LOW (ref 4.22–5.81)
RDW: 14.9 % (ref 11.5–15.5)
WBC: 10 10*3/uL (ref 4.0–10.5)

## 2018-09-17 LAB — EXTRA LAV TOP TUBE

## 2018-09-17 LAB — BASIC METABOLIC PANEL
BUN: 16 mg/dL (ref 6–23)
CHLORIDE: 105 meq/L (ref 96–112)
CO2: 29 mEq/L (ref 19–32)
Calcium: 9.3 mg/dL (ref 8.4–10.5)
Creatinine, Ser: 0.86 mg/dL (ref 0.40–1.50)
GFR: 85.83 mL/min (ref >60.00)
Glucose, Bld: 137 mg/dL — ABNORMAL HIGH (ref 70–99)
Potassium: 3.9 mEq/L (ref 3.5–5.1)
Sodium: 144 mEq/L (ref 135–145)

## 2018-09-17 LAB — HEMOGLOBIN A1C: Hgb A1c MFr Bld: 6.1 % (ref 4.6–6.5)

## 2018-09-17 LAB — T3: T3, Total: 89 ng/dL (ref 76–181)

## 2018-09-17 LAB — TSH: TSH: 0.96 u[IU]/mL (ref 0.35–4.50)

## 2018-09-19 ENCOUNTER — Encounter: Payer: Self-pay | Admitting: *Deleted

## 2018-09-20 ENCOUNTER — Encounter: Payer: Medicare Other | Attending: Registered Nurse | Admitting: Registered Nurse

## 2018-09-20 ENCOUNTER — Encounter: Payer: Self-pay | Admitting: Registered Nurse

## 2018-09-20 ENCOUNTER — Other Ambulatory Visit: Payer: Self-pay

## 2018-09-20 VITALS — BP 114/73 | HR 68 | Temp 99.5°F | Ht 72.0 in | Wt 341.4 lb

## 2018-09-20 DIAGNOSIS — M792 Neuralgia and neuritis, unspecified: Secondary | ICD-10-CM

## 2018-09-20 DIAGNOSIS — J449 Chronic obstructive pulmonary disease, unspecified: Secondary | ICD-10-CM | POA: Diagnosis not present

## 2018-09-20 DIAGNOSIS — I2510 Atherosclerotic heart disease of native coronary artery without angina pectoris: Secondary | ICD-10-CM | POA: Insufficient documentation

## 2018-09-20 DIAGNOSIS — G609 Hereditary and idiopathic neuropathy, unspecified: Secondary | ICD-10-CM | POA: Insufficient documentation

## 2018-09-20 DIAGNOSIS — M109 Gout, unspecified: Secondary | ICD-10-CM | POA: Diagnosis not present

## 2018-09-20 DIAGNOSIS — G894 Chronic pain syndrome: Secondary | ICD-10-CM

## 2018-09-20 DIAGNOSIS — K219 Gastro-esophageal reflux disease without esophagitis: Secondary | ICD-10-CM | POA: Insufficient documentation

## 2018-09-20 DIAGNOSIS — Z79891 Long term (current) use of opiate analgesic: Secondary | ICD-10-CM

## 2018-09-20 DIAGNOSIS — M48062 Spinal stenosis, lumbar region with neurogenic claudication: Secondary | ICD-10-CM | POA: Diagnosis not present

## 2018-09-20 DIAGNOSIS — E7800 Pure hypercholesterolemia, unspecified: Secondary | ICD-10-CM | POA: Diagnosis not present

## 2018-09-20 DIAGNOSIS — Z5181 Encounter for therapeutic drug level monitoring: Secondary | ICD-10-CM

## 2018-09-20 DIAGNOSIS — M1990 Unspecified osteoarthritis, unspecified site: Secondary | ICD-10-CM | POA: Insufficient documentation

## 2018-09-20 DIAGNOSIS — M79672 Pain in left foot: Secondary | ICD-10-CM | POA: Diagnosis present

## 2018-09-20 MED ORDER — OXYCODONE HCL 10 MG PO TABS: 10.0000 mg | ORAL_TABLET | Freq: Three times a day (TID) | ORAL | 0 refills | Status: DC | PRN

## 2018-09-20 NOTE — Progress Notes (Signed)
Subjective:    Patient ID: Tim [name], male    DOB: [birth year], 78 y.o.   MRN: [id number]  HPI: Tim [name] is a 78 y.o. male who returns for follow up appointment for chronic pain and medication refill. He states his pain is located in his lower back and left foot. He rates his pain 6. His current exercise regime is walking.  Tim [name] Morphine equivalent is 50.00 MME. Last UDS was Performed on 07/23/2018, it was consistent.   Pain Inventory Average Pain 8 Pain Right Now 6 My pain is intermittent, sharp, burning, dull, stabbing, tingling and aching  In the last 24 hours, has pain interfered with the following? General activity 8 Relation with others 4 Enjoyment of life 6 What TIME of day is your pain at its worst? night Sleep (in general) Poor  Pain is worse with: walking, bending, standing and some activites Pain improves with: pacing activities and medication Relief from Meds: 6  Mobility use a cane use a walker how many minutes can you walk? 2 ability to climb steps?  no do you drive?  yes  Function retired I need assistance with the following:  meal prep, household duties and shopping  Neuro/Psych bladder control problems weakness numbness tremor tingling trouble walking dizziness  Prior Studies Any changes since last visit?  no  Physicians involved in your care Any changes since last visit?  no   Family History  Problem Relation Age of Onset  . Hypertension Mother   . Coronary artery disease Mother   . Heart attack Mother   . Neuropathy Mother   . Pulmonary fibrosis Father        asbestosis   Social History   Socioeconomic History  . Marital status: Married    Spouse name: Not on file  . Number of children: 0  . Years of education: 39  . Highest education level: Not on file  Occupational History  . Occupation: Engineer, production    Comment: retired  Scientific laboratory technician  . Financial resource strain: Not on file  . Food insecurity:    Worry:  Not on file    Inability: Not on file  . Transportation needs:    Medical: Not on file    Non-medical: Not on file  Tobacco Use  . Smoking status: Never Smoker  . Smokeless tobacco: Never Used  Substance and Sexual Activity  . Alcohol use: No    Alcohol/week: 0.0 standard drinks  . Drug use: No    Types: Oxycodone  . Sexual activity: Not Currently  Lifestyle  . Physical activity:    Days per week: Not on file    Minutes per session: Not on file  . Stress: Not on file  Relationships  . Social connections:    Talks on phone: Not on file    Gets together: Not on file    Attends religious service: Not on file    Active member of club or organization: Not on file    Attends meetings of clubs or organizations: Not on file    Relationship status: Not on file  Other Topics Concern  . Not on file  Social History Narrative   HSG, [name] - BS, [name] - MS-engineering, 2 years on PhD - [place]. Married - '65 - 55yr/divorced; '76- 3 yrs/divorced; '92 . No children. 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 ACP/Living Will - Yes CPR; long-term Mechanical ventilation as long as he was able to  cognate; ok for long term artificial nutrition. Precondition being able to cognate and not to have too much pain.    Past Surgical History:  Procedure Laterality Date  . AMPUTATION Left 04/11/2013   Procedure: AMPUTATION DIGIT Left 3rd toe;  Surgeon: [name], MD;  Location: [facility];  Service: Orthopedics;  Laterality: Left;  Left 3rd toe amputation at [facility]  . CARDIAC CATHETERIZATION  1997; 03/10/16   1997 Non-obstructive disease.  03/2016 BMS to RCA, with 25% pDiag dz, o/w normal cors per cath 03/07/16.  Cath 11/01/16: in stent restenosis, successful baloon angioplasty.  [name] CARDIAC CATHETERIZATION  12/24/2012   mild < 20% LCx, prox 30% RCA; LVEF 55-65% , moderate pulmonary HTN, moderate AS  . CARDIAC DEFIBRILLATOR PLACEMENT  11/07/2017   [facility] MRI Quad CRT defibrillator   . CARDIOVASCULAR STRESS TEST  05/11/16 ([facility])   Myocardial perfusion imaging:  No ischemia; scar in apex, global hypokinesis, EF 36%.  . Carotid dopplers  03/09/2016   [facility]: no hemodynamically significant stenosis on either side.  . CHOLECYSTECTOMY    . COLONOSCOPY    . COLONOSCOPY [name] 10/30/2012   Procedure: COLONOSCOPY;  Surgeon: [name], MD;  Location: [facility];  Service: Endoscopy;  Laterality: [name];  . CORONARY ANGIOPLASTY WITH STENT PLACEMENT  03/2016; 04/2017   2017-[facility]: BMS to RCA-pt was placed on Brilinta.  04/2017: DES to RCA.  [name] EYE SURGERY Bilateral cataract  . HEMORRHOID SURGERY    . INTRAOCULAR LENS INSERTION Bilateral   . KNEE SURGERY Right   . LEFT AND RIGHT HEART CATHETERIZATION WITH CORONARY ANGIOGRAM [name] 12/24/2012   Procedure: LEFT AND RIGHT HEART CATHETERIZATION WITH CORONARY ANGIOGRAM;  Surgeon: [name], MD;  Location: [facility] CATH LAB;  Service: Cardiovascular;  Laterality: [name];  . LEG SURGERY Bilateral    lenghtening   . PACEMAKER PLACEMENT  04/13/2016   2nd deg HB after TAVR, pt had DC MDT PPM placed.  [name] SHOULDER ARTHROSCOPY  08/30/2011   Procedure: ARTHROSCOPY SHOULDER;  Surgeon: [name], MD;  Location: [place];  Service: Orthopedics;  Laterality: Right;  Right Shoulder Arthroscopy, Debridement, and Decompression  . SPINAL CORD STIMULATOR INSERTION [name] 09/10/2015   Procedure: LUMBAR SPINAL CORD STIMULATOR INSERTION;  Surgeon: [name], MD;  Location: [place] [facility];  Service: Neurosurgery;  Laterality: [name];  . 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  R big toe surg due to osteoarth  . TONSILLECTOMY    . traeculectomy Left    eye  . TRANSCATHETER AORTIC VALVE REPLACEMENT, TRANSFEMORAL  04/11/2016  . TRANSESOPHAGEAL ECHOCARDIOGRAM  03/09/2016   [facility]: EF 55-60%, PFO seen with bi-directional shunting, no thrombus in appendage.  . TRANSTHORACIC ECHOCARDIOGRAM  01/2015; 01/2016; 05/18/16; 09/18/16, 05/2017, 08/2017   01/2015 No signif change  in aortic stenosis (moderate).  01/2016 Severe LVH w/small LV cavity, EF 60-65%, grade I diast dysfxn.  05/2016 (s/p TAVR): EF 50-55%, grd I DD, biopros AV good.  08/2016--EF 50-55%, LV septal motion c/w conduction abnl, grd I DD,mild MS,bioprosth aortic valve well seated, w/trace AR. 05/2017 TTE EF 35%. 08/2017-EF 35%, mod diff hypokin LV, grd I DD, biopros AV good.   . TRANSTHORACIC ECHOCARDIOGRAM     04/2018: EF 40-45%, mod diffuse LV hypokinesis, grd I DD, bioprosth AV well seated, no AS or AR.  [name] VITRECTOMY     Past Medical History:  Diagnosis Date  . ASTHMA   . BPH (benign prostatic hypertrophy)   . CAD (coronary artery disease)  Nonobstructive by 12/2012 cath; then 03/2016 he required BMS to RCA ([facility]).  In-stent restenosis on cath 11/01/16, baloon angioplasty successful--pt to take plavix (no ASA), + eliquis (for PAF).  [name] CATARACT, HX OF   . Chronic combined systolic and diastolic CHF (congestive heart failure) ([place]) 05/2016   Ischemic CM.  04/2018 EF 40-45%, grd I DD.  [name] Chronic pain syndrome    Lumbar DDD; chronic neuropathic pain (DM); has spinal stimulator and sees pain mgmt MD  . COPD   . Diabetes mellitus type 2 with complications (HCC)    GYB6L jumped from 5.7% to 6.1% 03/2015---started metformin at that time.  DM 2 dx by fasting gluc criteria 2018.  Has chronic neuropath pain  . Elevated transaminase level 02/2016   Suspect due to fatty liver (documented on u/s 2007). Plan repeat labs 03/2016.  [name] Essential hypertension, benign   . Fatty liver 2007   2007 u/s showed fatty liver with hepatosplenomegaly.  2019 repeat u/s->fatty liver but no cirrhosis or hepatosplenomegaly.  [name] GERD (gastroesophageal reflux disease)   . Glaucoma   . GOUT   . Hallux valgus (acquired)   . HH (hiatus hernia)   . HYPERCHOLESTEROLEMIA-PURE   . Hypogonadism male   . Lumbosacral neuritis   . Lumbosacral spondylosis    Lumbar spinal stenosis with neurogenic claudication--contributes to his chronic  pain syndrome  . Normal memory function 08/2014   Neuropsychological testing ([facility]): no cognitive impairment or sign of neurodegenerative disorder.  Likely has adjustment d/o with mixed anxiety/depressed features and may benefit from low dose SNRI.    [name] Normocytic anemia 03/2016   Mild-pt needs ferritin and vit B12 level checked (as of 03/22/16)  . NSTEMI (non-ST elevated myocardial infarction) ([place]) 03/20/2016   BMS to RCA  . Obesity hypoventilation syndrome ([place])   . Obesity, unspecified   . Orthostatic hypotension   . OSA on CPAP    8 cm H2O  . OSTEOARTHRITIS   . Paroxysmal atrial fibrillation ([place]) 2003    (? chronic?) Off anticoag for a while due to falls.  Then apixaban started 12/2014.  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 Ella Bodo): neuropathic pain->diff to treat, failed nucynta, failed spinal stimul trial, oxycontin hs + tramadol + gabap as of 12/2017 f/u Dr. Letta Pate.  . Personal history of colonic adenoma 10/30/2012   Diminutive adenoma, consider repeat 2019 per GI  . Presence of cardiac defibrillator 11/07/2017  . Primary osteoarthritis of both knees    Bone on bone of medial compartments, + signif patellofemoral arth bilat.--supartz inj series started 09/12/17  . PUD (peptic ulcer disease)   . PULMONARY HYPERTENSION, HX OF   . Secondary male hypogonadism 2017  . 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 Shortness of breath    with exertion: much improved s/p TAVR and treatment for CHF.  . Sick sinus syndrome (HCC)    PPM placed  . Thrombocytopenia ([place]) 2018   HSM on 2007 abd u/s---suspect some mild splenic sequestration chronically.  [name] Unspecified glaucoma(365.9)   . Unspecified hereditary and idiopathic peripheral neuropathy approx age 89   bilat LE's, ? left arm, too.  Feet became progressively numb + left foot pain intermittently.  Pt may be  trying a spinal stimulator (as of 05/2015)  . VENOUS INSUFFICIENCY    Being followed by Dr. Sharol Given as of 10/2016 for two R LL venous  stasis ulcers/skin tears.  Healed as of 10/30/16 f/u with Dr. Sharol Given.  . VENTRAL HERNIA    BP 114/73   Pulse 68   Ht 6' (1.829 m)   Wt (!) 341 lb 6.4 oz (154.9 kg)   SpO2 94%   BMI 46.30 kg/m   Opioid Risk Score:   Fall Risk Score:  `1  Depression screen PHQ 2/9  Depression screen [facility] 2/9 09/20/2018 03/20/2018 02/12/2018 01/15/2018 09/17/2017 08/20/2017 07/18/2017  Decreased Interest 0 0 0 0 0 0 0  Down, Depressed, Hopeless 0 0 0 0 0 0 0  PHQ - 2 Score 0 0 0 0 0 0 0  Altered sleeping - - - - - - -  Tired, decreased energy - - - - - - -  Change in appetite - - - - - - -  Feeling bad or failure about yourself  - - - - - - -  Trouble concentrating - - - - - - -  Moving slowly or fidgety/restless - - - - - - -  Suicidal thoughts - - - - - - -  PHQ-9 Score - - - - - - -  Some recent data might be hidden    Review of Systems  Constitutional: Positive for fever.  HENT: Negative.   Eyes: Negative.   Respiratory: Negative.   Cardiovascular: Negative.   Gastrointestinal: Positive for diarrhea.  Endocrine: Negative.   Genitourinary: Negative.   Musculoskeletal: Negative.   Skin: Negative.   Allergic/Immunologic: Negative.   Neurological: Negative.   Hematological: Negative.   Psychiatric/Behavioral: Negative.   All other systems reviewed and are negative.      Objective:   Physical Exam Constitutional:      Appearance: Normal appearance. He is obese.  Neck:     Musculoskeletal: Normal range of motion and neck supple.  Cardiovascular:     Rate and Rhythm: Normal rate and regular rhythm.     Pulses: Normal pulses.     Heart sounds: Normal heart sounds.  Pulmonary:     Effort: Pulmonary effort is normal.     Breath sounds: Normal breath sounds.  Musculoskeletal:     Comments: Normal Muscle Bulk and Muscle Testing Reveals:  Upper Extremities:  Right: Decreased ROM 90 Degrees and Muscle Strength 5/5 Left: Full ROM and Muscle Strength 5/5  Back without spinal tenderness noted  Lower Extremities: Full ROM and Muscle Strength 5/5 Arises from Table Slowly using walker for support Narrow Based Gait   Skin:    General: Skin is warm and dry.  Neurological:     Mental Status: He is alert and oriented to person, place, and time.  Psychiatric:        Mood and Affect: Mood normal.        Behavior: Behavior normal.           Assessment & Plan:  1. Diabetic neuropathy with chronic neuropathic pain: Using Tens Unit: Continuecurrent medication withGabapentin 800 mg QID.09/20/2018 Refilled: Oxycodone 10 mg one tablet three times daily as needed may take an extra tablet when pain is sever only 10 days a month #100 andContinue:Tramadol 37.5/325mg TID as needed.  We will continue the opioid monitoring program, this consists of regular clinic visits, examinations, urine drug screen, pill counts as well as use of [place] Controlled Substance Reporting System. 2. DDD lumbar spine with LBP: Continue to increase activity as tolerated and heat therapy.09/20/2018. S/P Permanent Spinal Stimulator Placed 09/10/15 via Dr. Maryjean Ka. 3. Bilateral Chronic Knee  Pain: No complaints Today. Orthopedist Following.Continue to Monitor. 09/20/2018.  20 minutes of face to face patient care time was spent during this visit. All questions were encouraged and answered.  F/U in 1 month

## 2018-09-23 ENCOUNTER — Ambulatory Visit: Payer: Medicare Other | Admitting: Family Medicine

## 2018-09-23 ENCOUNTER — Encounter: Payer: Self-pay | Admitting: Family Medicine

## 2018-09-23 ENCOUNTER — Other Ambulatory Visit: Payer: Self-pay

## 2018-09-23 VITALS — BP 102/64 | HR 79 | Temp 98.3°F | Resp 14 | Ht 72.0 in | Wt 342.0 lb

## 2018-09-23 DIAGNOSIS — R42 Dizziness and giddiness: Secondary | ICD-10-CM | POA: Diagnosis not present

## 2018-09-23 NOTE — Progress Notes (Signed)
OFFICE VISIT  09/23/2018   CC:  Chief Complaint  Patient presents with  . Follow-up    dizziness, fatigue   HPI:    Patient is a 78 y.o. Caucasian male who presents for 1 week follow up orthostatic dizziness. CBC, lytes/cr, thyroid labs, and A1c all normal last visit.  I advised him to stop taking the torsemide that he had restarted the previous 5d and encouraged good intake of clear fluids.  He followed the above instructions and feels better. We reviewed his labs done last visit. No bp monitoring at home since I last saw him.  Still with some brief periods of lightheadedness upon waking up.  Feels better quicker.  He did wake up one night with SOB and wheezing. He used the albuterol inhaler and this helped w/in 1/2 hour and it hasn't recurred. Temp at home is normal.    No new complaints.  Past Medical History:  Diagnosis Date  . ASTHMA   . BPH (benign prostatic hypertrophy)   . CAD (coronary artery disease)    Nonobstructive by 12/2012 cath; then 03/2016 he required BMS to RCA ([facility]).  In-stent restenosis on cath 11/01/16, baloon angioplasty successful--pt to take plavix (no ASA), + eliquis (for PAF).  [name] CATARACT, HX OF   . Chronic combined systolic and diastolic CHF (congestive heart failure) ([place]) 05/2016   Ischemic CM.  04/2018 EF 40-45%, grd I DD.  [name] Chronic pain syndrome    Lumbar DDD; chronic neuropathic pain (DM); has spinal stimulator and sees pain mgmt MD  . COPD   . Diabetes mellitus type 2 with complications (HCC)    GNF6O jumped from 5.7% to 6.1% 03/2015---started metformin at that time.  DM 2 dx by fasting gluc criteria 2018.  Has chronic neuropath pain  . Elevated transaminase level 02/2016   Suspect due to fatty liver (documented on u/s 2007). Plan repeat labs 03/2016.  [name] Essential hypertension, benign   . Fatty liver 2007   2007 u/s showed fatty liver with hepatosplenomegaly.  2019 repeat u/s->fatty liver but no cirrhosis or hepatosplenomegaly.  [name] GERD  (gastroesophageal reflux disease)   . Glaucoma   . GOUT   . Hallux valgus (acquired)   . HH (hiatus hernia)   . HYPERCHOLESTEROLEMIA-PURE   . Hypogonadism male   . Lumbosacral neuritis   . Lumbosacral spondylosis    Lumbar spinal stenosis with neurogenic claudication--contributes to his chronic pain syndrome  . Normal memory function 08/2014   Neuropsychological testing ([facility]): no cognitive impairment or sign of neurodegenerative disorder.  Likely has adjustment d/o with mixed anxiety/depressed features and may benefit from low dose SNRI.    [name] Normocytic anemia 03/2016   Mild-pt needs ferritin and vit B12 level checked (as of 03/22/16)  . NSTEMI (non-ST elevated myocardial infarction) ([place]) 03/20/2016   BMS to RCA  . Obesity hypoventilation syndrome ([place])   . Obesity, unspecified   . Orthostatic hypotension   . OSA on CPAP    8 cm H2O  . OSTEOARTHRITIS   . Paroxysmal atrial fibrillation ([place]) 2003    (? chronic?) Off anticoag for a while due to falls.  Then apixaban started 12/2014.  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 Ella Bodo): neuropathic pain->diff to treat, failed nucynta, failed spinal stimul trial, oxycontin hs + tramadol + gabap as of 12/2017 f/u Dr. Letta Pate.  . Personal history of colonic adenoma 10/30/2012   Diminutive adenoma, consider repeat 2019 per GI  . Presence of  cardiac defibrillator 11/07/2017  . Primary osteoarthritis of both knees    Bone on bone of medial compartments, + signif patellofemoral arth bilat.--supartz inj series started 09/12/17  . PUD (peptic ulcer disease)   . PULMONARY HYPERTENSION, HX OF   . Secondary male hypogonadism 2017  . 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 Shortness of breath    with exertion: much improved s/p TAVR and treatment for CHF.  . Sick sinus syndrome (HCC)    PPM placed  . Thrombocytopenia ([place])  2018   HSM on 2007 abd u/s---suspect some mild splenic sequestration chronically.  [name] Unspecified glaucoma(365.9)   . Unspecified hereditary and idiopathic peripheral neuropathy approx age 53   bilat LE's, ? left arm, too.  Feet became progressively numb + left foot pain intermittently.  Pt may be trying a spinal stimulator (as of 05/2015)  . VENOUS INSUFFICIENCY    Being followed by Dr. Sharol Given as of 10/2016 for two R LL venous stasis ulcers/skin tears.  Healed as of 10/30/16 f/u with Dr. Sharol Given.  . VENTRAL HERNIA     Past Surgical History:  Procedure Laterality Date  . AMPUTATION Left 04/11/2013   Procedure: AMPUTATION DIGIT Left 3rd toe;  Surgeon: [name], MD;  Location: [facility];  Service: Orthopedics;  Laterality: Left;  Left 3rd toe amputation at [facility]  . CARDIAC CATHETERIZATION  1997; 03/10/16   1997 Non-obstructive disease.  03/2016 BMS to RCA, with 25% pDiag dz, o/w normal cors per cath 03/07/16.  Cath 11/01/16: in stent restenosis, successful baloon angioplasty.  [name] CARDIAC CATHETERIZATION  12/24/2012   mild < 20% LCx, prox 30% RCA; LVEF 55-65% , moderate pulmonary HTN, moderate AS  . CARDIAC DEFIBRILLATOR PLACEMENT  11/07/2017   [facility] MRI Quad CRT defibrillator  . CARDIOVASCULAR STRESS TEST  05/11/16 ([facility])   Myocardial perfusion imaging:  No ischemia; scar in apex, global hypokinesis, EF 36%.  . Carotid dopplers  03/09/2016   [facility]: no hemodynamically significant stenosis on either side.  . CHOLECYSTECTOMY    . COLONOSCOPY    . COLONOSCOPY [name] 10/30/2012   Procedure: COLONOSCOPY;  Surgeon: [name], MD;  Location: [facility];  Service: Endoscopy;  Laterality: [name];  . CORONARY ANGIOPLASTY WITH STENT PLACEMENT  03/2016; 04/2017   2017-[facility]: BMS to RCA-pt was placed on Brilinta.  04/2017: DES to RCA.  [name] EYE SURGERY Bilateral cataract  . HEMORRHOID SURGERY    . INTRAOCULAR LENS INSERTION Bilateral   . KNEE SURGERY Right   . LEFT AND RIGHT HEART CATHETERIZATION WITH CORONARY  ANGIOGRAM [name] 12/24/2012   Procedure: LEFT AND RIGHT HEART CATHETERIZATION WITH CORONARY ANGIOGRAM;  Surgeon: [name], MD;  Location: [facility] CATH LAB;  Service: Cardiovascular;  Laterality: [name];  . LEG SURGERY Bilateral    lenghtening   . PACEMAKER PLACEMENT  04/13/2016   2nd deg HB after TAVR, pt had DC MDT PPM placed.  [name] SHOULDER ARTHROSCOPY  08/30/2011   Procedure: ARTHROSCOPY SHOULDER;  Surgeon: [name], MD;  Location: [facility];  Service: Orthopedics;  Laterality: Right;  Right Shoulder Arthroscopy, Debridement, and Decompression  . SPINAL CORD STIMULATOR INSERTION [name] 09/10/2015   Procedure: LUMBAR SPINAL CORD STIMULATOR INSERTION;  Surgeon: [name], MD;  Location: [facility];  Service: Neurosurgery;  Laterality: [name];  . 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  R big toe surg due to osteoarth  . TONSILLECTOMY    .  traeculectomy Left    eye  . TRANSCATHETER AORTIC VALVE REPLACEMENT, TRANSFEMORAL  04/11/2016  . TRANSESOPHAGEAL ECHOCARDIOGRAM  03/09/2016   [facility]: EF 55-60%, PFO seen with bi-directional shunting, no thrombus in appendage.  . TRANSTHORACIC ECHOCARDIOGRAM  01/2015; 01/2016; 05/18/16; 09/18/16, 05/2017, 08/2017   01/2015 No signif change in aortic stenosis (moderate).  01/2016 Severe LVH w/small LV cavity, EF 60-65%, grade I diast dysfxn.  05/2016 (s/p TAVR): EF 50-55%, grd I DD, biopros AV good.  08/2016--EF 50-55%, LV septal motion c/w conduction abnl, grd I DD,mild MS,bioprosth aortic valve well seated, w/trace AR. 05/2017 TTE EF 35%. 08/2017-EF 35%, mod diff hypokin LV, grd I DD, biopros AV good.   . TRANSTHORACIC ECHOCARDIOGRAM     04/2018: EF 40-45%, mod diffuse LV hypokinesis, grd I DD, bioprosth AV well seated, no AS or AR.  [name] VITRECTOMY      Outpatient Medications Prior to Visit  Medication Sig Dispense Refill  . albuterol (PROVENTIL HFA;VENTOLIN HFA) 108 (90 BASE) MCG/ACT inhaler Inhale 2 puffs into the lungs 4 (four) times daily as needed for wheezing or  shortness of breath.    . allopurinol (ZYLOPRIM) 300 MG tablet TAKE 1 TABLET BY MOUTH ONCE DAILY WITH FOOD 90 tablet 3  . apixaban (ELIQUIS) 5 MG TABS tablet Take 1 tablet (5 mg total) by mouth 2 (two) times daily. 60 tablet 0  . aspirin EC 81 MG tablet Take 1 tablet by mouth daily.    . B Complex-C (B-COMPLEX WITH VITAMIN C) tablet Take 1 tablet by mouth daily.    . clotrimazole-betamethasone (LOTRISONE) cream apply to affected area twice a day 45 g 5  . ezetimibe (ZETIA) 10 MG tablet take 1 tablet by mouth once daily 90 tablet 3  . finasteride (PROSCAR) 5 MG tablet Take 1 tablet (5 mg total) by mouth daily. 90 tablet 1  . fluticasone (FLONASE) 50 MCG/ACT nasal spray Place 2 sprays into both nostrils as needed for allergies or rhinitis.    . Fluticasone-Salmeterol (ADVAIR) 250-50 MCG/DOSE AEPB Inhale 1 puff into the lungs daily as needed (for nasal congestion).     . gabapentin (NEURONTIN) 800 MG tablet Take 1 tablet (800 mg total) by mouth 4 (four) times daily. 360 tablet 2  . isosorbide mononitrate (IMDUR) 60 MG 24 hr tablet TAKE 1 TABLET BY MOUTH EVERY DAY 90 tablet 1  . KLOR-CON M20 20 MEQ tablet TAKE 1 TABLET EVERY DAY 90 tablet 1  . losartan (COZAAR) 50 MG tablet     . metoprolol succinate (TOPROL-XL) 25 MG 24 hr tablet Take 1 tablet by mouth daily. Along w/ 56m to equal 770mdaily    . metoprolol succinate (TOPROL-XL) 50 MG 24 hr tablet Take 50 mg by mouth daily. Take with or immediately following a meal.    . Multiple Vitamin (MULTIVITAMIN) capsule Take 1 capsule by mouth daily.    . mupirocin ointment (BACTROBAN) 2 % Apply 1 application topically 2 (two) times daily. Apply to the affected area 2 times a day 22 g 3  . naloxone (NARCAN) nasal spray 4 mg/0.1 mL Take for overdose of your oxycodone 1 kit 1  . niacin (NIASPAN) 1000 MG CR tablet TAKE 1 TABLET BY MOUTH TWICE A DAY 180 tablet 1  . nitroGLYCERIN (NITROSTAT) 0.4 MG SL tablet     . omeprazole (PRILOSEC) 10 MG capsule TAKE 1  CAPSULE BY MOUTH EVERY DAY 90 capsule 1  . Oxycodone HCl 10 MG TABS Take 1 tablet (10 mg total) by  mouth 3 (three) times daily as needed. May take an extra tablet when pain is severe 10 days out the month. 100 tablet 0  . rosuvastatin (CRESTOR) 40 MG tablet take 1 tablet by mouth once daily 15 tablet 0  . sacubitril-valsartan (ENTRESTO) 24-26 MG Take 1 tablet by mouth 2 (two) times daily.    [name] torsemide (DEMADEX) 20 MG tablet 20 mg daily as needed    . traMADol-acetaminophen (ULTRACET) 37.5-325 MG tablet Take 1 tablet by mouth 3 (three) times daily as needed. 90 tablet 3  . VITAMIN D, CHOLECALCIFEROL, PO Take 1 tablet by mouth daily.     . cephALEXin (KEFLEX) 500 MG capsule Take 1 capsule (500 mg total) by mouth 4 (four) times daily. 20 capsule 0   No facility-administered medications prior to visit.     Allergies  Allergen Reactions  . Brimonidine Tartrate Shortness Of Breath    Alphagan-Shortness of breath  . Brimonidine Tartrate Palpitations  . Brinzolamide Shortness Of Breath    AZOPT- Shortness of breath  . Latanoprost Shortness Of Breath    XALATAN- Shortness of breath  . Nucynta [Tapentadol] Shortness Of Breath  . Sulfa Antibiotics Palpitations  . Timolol Maleate Shortness Of Breath and Other (See Comments)    TIMOPTIC- Aggravated asthma  . Cardizem  [Diltiazem Hcl] Swelling  . Diltiazem Swelling     leg swelling  . Rofecoxib Swelling     VIOXX- leg swelling  . Vancomycin Hives and Other (See Comments)    Possible "Red Man Syndrome"? > hives/blisters  . Codeine Other (See Comments)    Childhood reaction  . Celecoxib Other (See Comments)    CELLBREX-confusion  . Colchicine Diarrhea    diarrhea  . Tape Rash    ROS As per HPI  PE: Blood pressure 102/64, pulse 79, temperature 98.3 F (36.8 C), temperature source Oral, resp. rate 14, height 6' (1.829 m), weight (!) 342 lb (155.1 kg), SpO2 97 %. Gen: Alert, tired- appearing.  NAD/nontoxic.  Patient is oriented to  person, place, time, and situation. AFFECT: pleasant, lucid thought and speech. AXK:PVVZ: no injection, icteris, swelling, or exudate.  EOMI, PERRLA. Mouth: lips without lesion/swelling.  Oral mucosa pink and moist. Oropharynx without erythema, exudate, or swelling.  CV: RRR with some ectopy, soft systolic murmur.  No rub/gallop. EXT: diffuse doughy, nonpitting edema in entire LL bilat, with superficial peeling and some scattered scabs. 1+ Pitting edema in ankles and tops of feet.  LABS:   Lab Results  Component Value Date   WBC 10.0 09/16/2018   HGB 12.9 (L) 09/16/2018   HCT 38.3 (L) 09/16/2018   MCV 92.7 09/16/2018   PLT 126.0 (L) 09/16/2018   Lab Results  Component Value Date   CREATININE 0.86 09/16/2018   BUN 16 09/16/2018   NA 144 09/16/2018   K 3.9 09/16/2018   CL 105 09/16/2018   CO2 29 09/16/2018   Lab Results  Component Value Date   ALT 14 06/06/2018   AST 19 06/06/2018   ALKPHOS 84 06/06/2018   BILITOT 0.5 06/06/2018   Lab Results  Component Value Date   CHOL 108 08/09/2017   Lab Results  Component Value Date   HDL 56.30 08/09/2017   Lab Results  Component Value Date   LDLCALC 39 08/09/2017   Lab Results  Component Value Date   TRIG 63.0 08/09/2017   Lab Results  Component Value Date   CHOLHDL 2 08/09/2017   Lab Results  Component Value Date  PSA 0.23 11/23/2015   PSA 0.33 10/07/2014   PSA 0.40 09/25/2013   Lab Results  Component Value Date   TSH 0.96 09/16/2018   T3TOTAL 89 09/16/2018   Lab Results  Component Value Date   HGBA1C 6.1 09/16/2018    IMPRESSION AND PLAN:  1) Orthostatic dizziness, acute on chronic: he is doing better since getting a bit better hydrated. He is sitting at home at his computer a lot more in the last couple weeks and this has probably led to increased LL edema, and this had prompted him to take torsemide -->thereby drying him out some and causing a worsening of his chronic orthostatic dizziness. No changes  today.  An After Visit Summary was printed and given to the patient.  FOLLOW UP: Return in about 3 months (around 12/24/2018) for routine chronic illness f/u.  Signed:  Crissie Sickles, MD           09/23/2018

## 2018-10-01 ENCOUNTER — Telehealth: Payer: Medicare Other | Admitting: Physician Assistant

## 2018-10-01 DIAGNOSIS — R300 Dysuria: Secondary | ICD-10-CM

## 2018-10-01 NOTE — Progress Notes (Signed)
Based on what you shared with me, I feel your condition warrants further evaluation and I recommend that you be seen for a face to face office visit.     NOTE: If you entered your credit card information for this eVisit, you will not be charged. You may see a "hold" on your card for the $35 but that hold will drop off and you will not have a charge processed.  If you are having a true medical emergency please call 911.  If you need an urgent face to face visit, [facility] has four urgent care centers for your convenience.    PLEASE NOTE: THE [facility] LOCATIONS AND URGENT CARE CLINICS DO NOT HAVE THE TESTING FOR CORONAVIRUS COVID19 AVAILABLE.  IF YOU FEEL YOU NEED THIS TEST YOU MUST GO TO A TRIAGE LOCATION AT [facility] ?  [email or web address] to reserve your spot online an avoid wait times  [name] [address] [place], [place] [address] 8 am to 8 pm Monday-Friday 10 am to 4 pm Saturday-Sunday *Across the street from [employer]  [place], [address] 8 am to 5 pm Monday-Friday * In the [facility] on the [facility]   The following sites will take your insurance:  [facility] a Provider at this Location  [place], [place] [address] 10 am to 8 pm Monday-Friday 12 pm to 8 pm [facility] a Provider at this Location  [place], [place], [place] [address] 8 am to 8 pm Monday-Friday 9 am to 6 pm Saturday 11 am to 6 pm Sunday   [facility]  [phone] Get Driving Directions  [address] [place], [place] [address] 8 am to 8 pm Monday-Friday 8 am to 4 pm Saturday-Sunday   Your e-visit answers were reviewed by a board  certified advanced clinical practitioner to complete your personal care plan.  Thank you for using e-Visits.  ===View-only below this line===   ----- Message -----    From: Haynes Hoehn    Sent: 10/01/2018  3:59 PM EDT      To: E-Visit Mailing List Subject: E-Visit Submission: Urinary Problems  E-Visit Submission: Urinary Problems --------------------------------  Question: Which of the following are you experiencing? Answer:   Pain while passing urine  Question: When you have pain when passing urine, which of these apply? Answer:   I have a burning sensation  Question: Are you able to pass urine? Answer:   Yes, I can pass urine.  Question: How long have you had pain or difficulty passing urine? Answer:   More than one week  Question: Do you have a fever? Answer:   No, I do not have a fever  Question: Do you have any of the following? Answer:   I have none of these problems  Question: Do you have an exaggerated sensation of the need to pass urine? Answer:   No, the sensation is normal  Question: Do you have the urge to urinate more of less frequently than normal? Answer:   More frequently  Question: What does your urine look like? Answer:   It is clear  Question: Do you have any of the following? Answer:   None of the above  Question: Do you  have any of the following? Answer:   No discharge  Question: Do you have any sores on your genitals? Answer:   No  Question: Do you have any history of kidney dysfunction or kidney problems? Answer:   No  Question: Within the past 3 months, have you had any surgery on your kidneys or bladder, or have you had a tube inserted to collect your urine? Answer:   No, I have never had either  Question: Have you had similar symptoms in the past? Answer:   Yes, I have had similar symptoms more than once before  Question: If you had similar symptoms in the past, did any of the following work? Answer:   Pills for urine  infection  Question: Please list any additional comments  Answer:   This has also been the subject of 2 out of the last 4 visits to Tim [name] & partner Tim [name]?). Each time the antibiotic they prescribed took care of the symptoms within 24 hours.  Question: Please list your medication allergies that you may have ? (If 'none' , please list as 'none') Answer:   vancomycin, vioxx, celebrex, codeine, diltiazem ER, colchisine, nearly all meds tried for glaucoma (axopt, timoptic, xalatan, alphagan, though trusopt and alphagan P seem to be safe)

## 2018-10-03 ENCOUNTER — Other Ambulatory Visit: Payer: Self-pay

## 2018-10-03 ENCOUNTER — Ambulatory Visit (INDEPENDENT_AMBULATORY_CARE_PROVIDER_SITE_OTHER): Payer: Medicare Other | Admitting: Family Medicine

## 2018-10-03 ENCOUNTER — Encounter: Payer: Self-pay | Admitting: Family Medicine

## 2018-10-03 VITALS — BP 115/56 | HR 67 | Temp 97.8°F | Wt 333.0 lb

## 2018-10-03 DIAGNOSIS — R42 Dizziness and giddiness: Secondary | ICD-10-CM

## 2018-10-03 DIAGNOSIS — N3000 Acute cystitis without hematuria: Secondary | ICD-10-CM

## 2018-10-03 MED ORDER — CIPROFLOXACIN HCL 500 MG PO TABS: 500.0000 mg | ORAL_TABLET | Freq: Two times a day (BID) | ORAL | 0 refills | Status: AC

## 2018-10-03 NOTE — Progress Notes (Signed)
Virtual Visit via Video Note  I connected with pt on 10/03/18 at 11:15 AM EDT by telephone b/c video enabled telemedicine application had technical difficulties.  I verified that I am speaking with the correct person using two identifiers.  Location patient: home Location provider:work office Persons participating in the virtual visit: patient, provider  I discussed the limitations of evaluation and management by telemedicine and the availability of in person appointments. The patient expressed understanding and agreed to proceed.   HPI: "I think a urinary infection is back" Dysuria started about 1 week ago, was mild and now stronger pain with urination x 2d, hard to tell about urgency or frequency given his prn use of torsemide---he's not really sure. Stream drips longer than usual, as per his sx's with most recent two UTIs. No nausea or abd pain or CVA /flank pain. No gross blood in urine. No fevers.  He is circumcised.    ROS: See pertinent positives and negatives per HPI.  Past Medical History:  Diagnosis Date  . ASTHMA   . BPH (benign prostatic hypertrophy)   . CAD (coronary artery disease)    Nonobstructive by 12/2012 cath; then 03/2016 he required BMS to RCA ([facility]).  In-stent restenosis on cath 11/01/16, baloon angioplasty successful--pt to take plavix (no ASA), + eliquis (for PAF).  [name] CATARACT, HX OF   . Chronic combined systolic and diastolic CHF (congestive heart failure) ([place]) 05/2016   Ischemic CM.  04/2018 EF 40-45%, grd I DD.  [name] Chronic pain syndrome    Lumbar DDD; chronic neuropathic pain (DM); has spinal stimulator and sees pain mgmt MD  . COPD   . Diabetes mellitus type 2 with complications (HCC)    GNF6O jumped from 5.7% to 6.1% 03/2015---started metformin at that time.  DM 2 dx by fasting gluc criteria 2018.  Has chronic neuropath pain  . Elevated transaminase level 02/2016   Suspect due to fatty liver (documented on u/s 2007). Plan repeat labs 03/2016.  [name]  Essential hypertension, benign   . Fatty liver 2007   2007 u/s showed fatty liver with hepatosplenomegaly.  2019 repeat u/s->fatty liver but no cirrhosis or hepatosplenomegaly.  [name] GERD (gastroesophageal reflux disease)   . Glaucoma   . GOUT   . Hallux valgus (acquired)   . HH (hiatus hernia)   . HYPERCHOLESTEROLEMIA-PURE   . Hypogonadism male   . Lumbosacral neuritis   . Lumbosacral spondylosis    Lumbar spinal stenosis with neurogenic claudication--contributes to his chronic pain syndrome  . Normal memory function 08/2014   Neuropsychological testing ([facility]): no cognitive impairment or sign of neurodegenerative disorder.  Likely has adjustment d/o with mixed anxiety/depressed features and may benefit from low dose SNRI.    [name] Normocytic anemia 03/2016   Mild-pt needs ferritin and vit B12 level checked (as of 03/22/16)  . NSTEMI (non-ST elevated myocardial infarction) ([place]) 03/20/2016   BMS to RCA  . Obesity hypoventilation syndrome ([place])   . Obesity, unspecified   . Orthostatic hypotension   . OSA on CPAP    8 cm H2O  . OSTEOARTHRITIS   . Paroxysmal atrial fibrillation ([place]) 2003    (? chronic?) Off anticoag for a while due to falls.  Then apixaban started 12/2014.  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 Ella Bodo): neuropathic pain->diff to treat, failed nucynta, failed spinal stimul trial, oxycontin hs + tramadol + gabap as of 12/2017 f/u Dr. Letta Pate.  . Personal history of colonic adenoma 10/30/2012  Diminutive adenoma, consider repeat 2019 per GI  . Presence of cardiac defibrillator 11/07/2017  . Primary osteoarthritis of both knees    Bone on bone of medial compartments, + signif patellofemoral arth bilat.--supartz inj series started 09/12/17  . PUD (peptic ulcer disease)   . PULMONARY HYPERTENSION, HX OF   . Secondary male hypogonadism 2017  . Severe aortic stenosis    by cath by [facility] 03/2016; CT surgery saw him and  did TAVR 04/11/16 ([facility])  . Shortness of breath    with exertion: much improved s/p TAVR and treatment for CHF.  . Sick sinus syndrome (HCC)    PPM placed  . Thrombocytopenia ([place]) 2018   HSM on 2007 abd u/s---suspect some mild splenic sequestration chronically.  [name] Unspecified glaucoma(365.9)   . Unspecified hereditary and idiopathic peripheral neuropathy approx age 74   bilat LE's, ? left arm, too.  Feet became progressively numb + left foot pain intermittently.  Pt may be trying a spinal stimulator (as of 05/2015)  . VENOUS INSUFFICIENCY    Being followed by Dr. Sharol Given as of 10/2016 for two R LL venous stasis ulcers/skin tears.  Healed as of 10/30/16 f/u with Dr. Sharol Given.  . VENTRAL HERNIA     Past Surgical History:  Procedure Laterality Date  . AMPUTATION Left 04/11/2013   Procedure: AMPUTATION DIGIT Left 3rd toe;  Surgeon: [name], MD;  Location: [facility];  Service: Orthopedics;  Laterality: Left;  Left 3rd toe amputation at [facility]  . CARDIAC CATHETERIZATION  1997; 03/10/16   1997 Non-obstructive disease.  03/2016 BMS to RCA, with 25% pDiag dz, o/w normal cors per cath 03/07/16.  Cath 11/01/16: in stent restenosis, successful baloon angioplasty.  [name] CARDIAC CATHETERIZATION  12/24/2012   mild < 20% LCx, prox 30% RCA; LVEF 55-65% , moderate pulmonary HTN, moderate AS  . CARDIAC DEFIBRILLATOR PLACEMENT  11/07/2017   [facility] MRI Quad CRT defibrillator  . CARDIOVASCULAR STRESS TEST  05/11/16 ([facility])   Myocardial perfusion imaging:  No ischemia; scar in apex, global hypokinesis, EF 36%.  . Carotid dopplers  03/09/2016   [facility]: no hemodynamically significant stenosis on either side.  . CHOLECYSTECTOMY    . COLONOSCOPY    . COLONOSCOPY [name] 10/30/2012   Procedure: COLONOSCOPY;  Surgeon: [name], MD;  Location: [facility];  Service: Endoscopy;  Laterality: [name];  . CORONARY ANGIOPLASTY WITH STENT PLACEMENT  03/2016; 04/2017   2017-[facility]: BMS to RCA-pt was placed on Brilinta.  04/2017: DES to  RCA.  [name] EYE SURGERY Bilateral cataract  . HEMORRHOID SURGERY    . INTRAOCULAR LENS INSERTION Bilateral   . KNEE SURGERY Right   . LEFT AND RIGHT HEART CATHETERIZATION WITH CORONARY ANGIOGRAM [name] 12/24/2012   Procedure: LEFT AND RIGHT HEART CATHETERIZATION WITH CORONARY ANGIOGRAM;  Surgeon: [name], MD;  Location: [name] CATH LAB;  Service: Cardiovascular;  Laterality: [name];  . LEG SURGERY Bilateral    lenghtening   . PACEMAKER PLACEMENT  04/13/2016   2nd deg HB after TAVR, pt had DC MDT PPM placed.  [name] SHOULDER ARTHROSCOPY  08/30/2011   Procedure: ARTHROSCOPY SHOULDER;  Surgeon: [name], MD;  Location: [facility];  Service: Orthopedics;  Laterality: Right;  Right Shoulder Arthroscopy, Debridement, and Decompression  . SPINAL CORD STIMULATOR INSERTION [name] 09/10/2015   Procedure: LUMBAR SPINAL CORD STIMULATOR INSERTION;  Surgeon: [name], MD;  Location: [facility];  Service: Neurosurgery;  Laterality: [name];  . 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  R big toe surg due to osteoarth  . TONSILLECTOMY    . traeculectomy Left    eye  . TRANSCATHETER AORTIC VALVE REPLACEMENT, TRANSFEMORAL  04/11/2016  . TRANSESOPHAGEAL ECHOCARDIOGRAM  03/09/2016   [facility]: EF 55-60%, PFO seen with bi-directional shunting, no thrombus in appendage.  . TRANSTHORACIC ECHOCARDIOGRAM  01/2015; 01/2016; 05/18/16; 09/18/16, 05/2017, 08/2017   01/2015 No signif change in aortic stenosis (moderate).  01/2016 Severe LVH w/small LV cavity, EF 60-65%, grade I diast dysfxn.  05/2016 (s/p TAVR): EF 50-55%, grd I DD, biopros AV good.  08/2016--EF 50-55%, LV septal motion c/w conduction abnl, grd I DD,mild MS,bioprosth aortic valve well seated, w/trace AR. 05/2017 TTE EF 35%. 08/2017-EF 35%, mod diff hypokin LV, grd I DD, biopros AV good.   . TRANSTHORACIC ECHOCARDIOGRAM     04/2018: EF 40-45%, mod diffuse LV hypokinesis, grd I DD, bioprosth AV well seated, no AS or AR.  [name] VITRECTOMY       Current Outpatient Medications:   .  albuterol (PROVENTIL HFA;VENTOLIN HFA) 108 (90 BASE) MCG/ACT inhaler, Inhale 2 puffs into the lungs 4 (four) times daily as needed for wheezing or shortness of breath., Disp: , Rfl:  .  allopurinol (ZYLOPRIM) 300 MG tablet, TAKE 1 TABLET BY MOUTH ONCE DAILY WITH FOOD, Disp: 90 tablet, Rfl: 3 .  apixaban (ELIQUIS) 5 MG TABS tablet, Take 1 tablet (5 mg total) by mouth 2 (two) times daily., Disp: 60 tablet, Rfl: 0 .  aspirin EC 81 MG tablet, Take 1 tablet by mouth daily., Disp: , Rfl:  .  B Complex-C (B-COMPLEX WITH VITAMIN C) tablet, Take 1 tablet by mouth daily., Disp: , Rfl:  .  clotrimazole-betamethasone (LOTRISONE) cream, apply to affected area twice a day, Disp: 45 g, Rfl: 5 .  ezetimibe (ZETIA) 10 MG tablet, take 1 tablet by mouth once daily, Disp: 90 tablet, Rfl: 3 .  finasteride (PROSCAR) 5 MG tablet, Take 1 tablet (5 mg total) by mouth daily., Disp: 90 tablet, Rfl: 1 .  fluticasone (FLONASE) 50 MCG/ACT nasal spray, Place 2 sprays into both nostrils as needed for allergies or rhinitis., Disp: , Rfl:  .  Fluticasone-Salmeterol (ADVAIR) 250-50 MCG/DOSE AEPB, Inhale 1 puff into the lungs daily as needed (for nasal congestion). , Disp: , Rfl:  .  gabapentin (NEURONTIN) 800 MG tablet, Take 1 tablet (800 mg total) by mouth 4 (four) times daily., Disp: 360 tablet, Rfl: 2 .  isosorbide mononitrate (IMDUR) 60 MG 24 hr tablet, TAKE 1 TABLET BY MOUTH EVERY DAY (Patient taking differently: 2 (two) times daily. ** DO NOT CRUSH **), Disp: 90 tablet, Rfl: 1 .  KLOR-CON M20 20 MEQ tablet, TAKE 1 TABLET EVERY DAY, Disp: 90 tablet, Rfl: 1 .  losartan (COZAAR) 50 MG tablet, , Disp: , Rfl:  .  metoprolol succinate (TOPROL-XL) 25 MG 24 hr tablet, Take 1 tablet by mouth daily. Along w/ 65m to equal 753mdaily, Disp: , Rfl:  .  metoprolol succinate (TOPROL-XL) 50 MG 24 hr tablet, Take 50 mg by mouth daily. Take with or immediately following a meal., Disp: , Rfl:  .  Multiple Vitamin (MULTIVITAMIN) capsule, Take  1 capsule by mouth daily., Disp: , Rfl:  .  mupirocin ointment (BACTROBAN) 2 %, Apply 1 application topically 2 (two) times daily. Apply to the affected area 2 times a day, Disp: 22 g, Rfl: 3 .  niacin (NIASPAN) 1000 MG CR tablet, TAKE 1 TABLET BY MOUTH TWICE A DAY, Disp: 180 tablet, Rfl: 1 .  nitroGLYCERIN (NITROSTAT) 0.4 MG SL tablet, , Disp: , Rfl:  .  omeprazole (PRILOSEC) 10 MG capsule, TAKE 1 CAPSULE BY MOUTH EVERY DAY, Disp: 90 capsule, Rfl: 1 .  Oxycodone HCl 10 MG TABS, Take 1 tablet (10 mg total) by mouth 3 (three) times daily as needed. May take an extra tablet when pain is severe 10 days out the month., Disp: 100 tablet, Rfl: 0 .  rosuvastatin (CRESTOR) 40 MG tablet, take 1 tablet by mouth once daily, Disp: 15 tablet, Rfl: 0 .  sacubitril-valsartan (ENTRESTO) 24-26 MG, Take 1 tablet by mouth 2 (two) times daily., Disp: , Rfl:  .  torsemide (DEMADEX) 20 MG tablet, 20 mg daily as needed, Disp: , Rfl:  .  traMADol-acetaminophen (ULTRACET) 37.5-325 MG tablet, Take 1 tablet by mouth 3 (three) times daily as needed., Disp: 90 tablet, Rfl: 3 .  VITAMIN D, CHOLECALCIFEROL, PO, Take 1 tablet by mouth daily. , Disp: , Rfl:  .  naloxone (NARCAN) nasal spray 4 mg/0.1 mL, Take for overdose of your oxycodone (Patient not taking: Reported on 10/03/2018), Disp: 1 kit, Rfl: 1  EXAM:  VITALS per patient if applicable: BP (!) 497/53 (BP Location: Left Wrist, Patient Position: Sitting, Cuff Size: Large)   Pulse 67   Temp 97.8 F (36.6 C) (Oral)   Wt (!) 333 lb (151 kg)   SpO2 99%   BMI 45.16 kg/m   Gen: Alert, NAD by voice/interaction on phone.  Patient is oriented to person, place, time, and situation. Lucid thought and speech.  LAB: none  ASSESSMENT AND PLAN:  Discussed the following assessment and plan:  1) UTI sx's: will treat based on sx's (plus these sx's match exactly with sx's of his prior UTI's (has had 3 in approx the last 18 mo). Last clx we have showed e coli resistant to sulfa  and amox. Will tx today with cipro 500 mg bid x 5d. No urine specimen at this time b/c would like pt to NOT have to risk exposure to covid 19 by coming in to give urine specimen.  However, if not improving with cipro in a few days then he'll have to give urine sample to send for ua and c/s.  2) Orthostatic dizziness; soft bp's.  Of note, he gave updated report of orthostatic dizziness-->slightly improved since I saw him last, "not a problem". Home bp's ranging from 76-138 syst over 00-51 diastolic.   I discussed the assessment and treatment plan with the patient. The patient was provided an opportunity to ask questions and all were answered. The patient agreed with the plan and demonstrated an understanding of the instructions.   The patient was advised to call back or seek an in-person evaluation if the symptoms worsen or if the condition fails to improve as anticipated.  F/u: prn/if not improving  Signed:  Crissie Sickles, MD           10/03/2018

## 2018-10-09 ENCOUNTER — Telehealth: Payer: Self-pay | Admitting: Family Medicine

## 2018-10-09 DIAGNOSIS — N3001 Acute cystitis with hematuria: Secondary | ICD-10-CM

## 2018-10-09 NOTE — Telephone Encounter (Signed)
Patient states antibiotic Rx'd 10/03/18 is not helping. Please advise.

## 2018-10-10 ENCOUNTER — Other Ambulatory Visit: Payer: Medicare Other

## 2018-10-10 ENCOUNTER — Other Ambulatory Visit: Payer: Self-pay

## 2018-10-10 DIAGNOSIS — N3001 Acute cystitis with hematuria: Secondary | ICD-10-CM

## 2018-10-10 MED ORDER — CEFDINIR 300 MG PO CAPS: 300.0000 mg | ORAL_CAPSULE | Freq: Two times a day (BID) | ORAL | 0 refills | Status: DC

## 2018-10-10 NOTE — Telephone Encounter (Signed)
He needs to give a urine sample somewhere for UA and culture for dx of acute cystitis without hematuria. I'll send in new antibiotic now.

## 2018-10-10 NOTE — Telephone Encounter (Signed)
Called pt and he states he is still having pain with urination and cloudy urine but yellow. Denies fever. Pt still has another dose of abx to take tonight. Pt states he would like to go to [place] if he has to give a urine sample.   Please advise

## 2018-10-10 NOTE — Telephone Encounter (Signed)
Pt was called and given information. Per Estill Bamberg pt can go to [place] location to get urine test. Pt agreed. Orders placed.

## 2018-10-11 LAB — URINALYSIS W MICROSCOPIC + REFLEX CULTURE
Bacteria, UA: NONE SEEN /HPF
Bilirubin Urine: NEGATIVE
Glucose, UA: NEGATIVE
Hgb urine dipstick: NEGATIVE
Hyaline Cast: NONE SEEN /LPF
Leukocyte Esterase: NEGATIVE
Nitrites, Initial: NEGATIVE
Protein, ur: NEGATIVE
Specific Gravity, Urine: 1.03 (ref 1.001–1.03)
pH: 5.5 (ref 5.0–8.0)

## 2018-10-11 LAB — NO CULTURE INDICATED

## 2018-10-21 ENCOUNTER — Encounter: Payer: Self-pay | Admitting: Registered Nurse

## 2018-10-21 ENCOUNTER — Other Ambulatory Visit: Payer: Self-pay

## 2018-10-21 ENCOUNTER — Encounter: Payer: Medicare Other | Attending: Registered Nurse | Admitting: Registered Nurse

## 2018-10-21 VITALS — BP 111/56 | HR 72 | Temp 97.8°F | Ht 72.0 in | Wt 327.0 lb

## 2018-10-21 DIAGNOSIS — M25561 Pain in right knee: Secondary | ICD-10-CM | POA: Diagnosis not present

## 2018-10-21 DIAGNOSIS — G8929 Other chronic pain: Secondary | ICD-10-CM

## 2018-10-21 DIAGNOSIS — G609 Hereditary and idiopathic neuropathy, unspecified: Secondary | ICD-10-CM | POA: Insufficient documentation

## 2018-10-21 DIAGNOSIS — M792 Neuralgia and neuritis, unspecified: Secondary | ICD-10-CM

## 2018-10-21 DIAGNOSIS — M62830 Muscle spasm of back: Secondary | ICD-10-CM

## 2018-10-21 DIAGNOSIS — M48062 Spinal stenosis, lumbar region with neurogenic claudication: Secondary | ICD-10-CM

## 2018-10-21 DIAGNOSIS — E1140 Type 2 diabetes mellitus with diabetic neuropathy, unspecified: Secondary | ICD-10-CM | POA: Diagnosis not present

## 2018-10-21 DIAGNOSIS — J449 Chronic obstructive pulmonary disease, unspecified: Secondary | ICD-10-CM | POA: Insufficient documentation

## 2018-10-21 DIAGNOSIS — I2510 Atherosclerotic heart disease of native coronary artery without angina pectoris: Secondary | ICD-10-CM | POA: Insufficient documentation

## 2018-10-21 DIAGNOSIS — M109 Gout, unspecified: Secondary | ICD-10-CM | POA: Insufficient documentation

## 2018-10-21 DIAGNOSIS — K219 Gastro-esophageal reflux disease without esophagitis: Secondary | ICD-10-CM | POA: Insufficient documentation

## 2018-10-21 DIAGNOSIS — M25562 Pain in left knee: Secondary | ICD-10-CM

## 2018-10-21 DIAGNOSIS — E7800 Pure hypercholesterolemia, unspecified: Secondary | ICD-10-CM | POA: Insufficient documentation

## 2018-10-21 DIAGNOSIS — Z5181 Encounter for therapeutic drug level monitoring: Secondary | ICD-10-CM

## 2018-10-21 DIAGNOSIS — M1990 Unspecified osteoarthritis, unspecified site: Secondary | ICD-10-CM | POA: Insufficient documentation

## 2018-10-21 DIAGNOSIS — G894 Chronic pain syndrome: Secondary | ICD-10-CM

## 2018-10-21 MED ORDER — OXYCODONE HCL 10 MG PO TABS: 10.0000 mg | ORAL_TABLET | Freq: Three times a day (TID) | ORAL | 0 refills | Status: DC | PRN

## 2018-10-21 MED ORDER — CYCLOBENZAPRINE HCL 5 MG PO TABS: ORAL_TABLET | ORAL | 1 refills | Status: DC

## 2018-10-21 NOTE — Progress Notes (Signed)
Subjective:    Patient ID: Tim [name], male    DOB: [birth year], 78 y.o.   MRN: [id number]  HPI: Tim [name] is a 78 y.o. male his appointment was changed, due to national recommendations of social distancing due to [place] 19, an audio/video telehealth visit is felt to be most appropriate for this patient at this time.  See Chart message from today for the patient's consent to telehealth from [facility].     He states his pain is located in his lower back and left foot pain. Also reports increase intensity and frequency of muscle spasm in his mid-back, medication list reviewed. We will prescribed flexeril, he verbalizes understanding. Instructed to call office at the end of week to evaluate medication rates pain His  current exercise regime is walking short distances.   [name] Morphine equivalent is 61.25 MME.  Last UDS was Performed on 07/23/2018, it was consistent for oxycodone.   Jasmine December CMA asked the health and History Questions. This provider and Kennon Rounds  verified we were  speaking with the correct person using two identifiers.   Pain Inventory Average Pain 10 Pain Right Now 8 My pain is sharp, dull, tingling and aching  In the last 24 hours, has pain interfered with the following? General activity 8 Relation with others 8 Enjoyment of life 8 What TIME of day is your pain at its worst? all Sleep (in general) Poor  Pain is worse with: walking, bending, sitting and some activites Pain improves with: rest, pacing activities and medication Relief from Meds: 7  Mobility use a cane how many minutes can you walk? 3 ability to climb steps?  yes do you drive?  yes  Function retired I need assistance with the following:  household duties and shopping  Neuro/Psych bladder control problems weakness numbness tingling trouble walking spasms dizziness  Prior Studies Any changes since last visit?  no  Physicians involved  in your care Any changes since last visit?  no   Family History  Problem Relation Age of Onset  . Hypertension Mother   . Coronary artery disease Mother   . Heart attack Mother   . Neuropathy Mother   . Pulmonary fibrosis Father        asbestosis   Social History   Socioeconomic History  . Marital status: Married    Spouse name: Not on file  . Number of children: 0  . Years of education: 26  . Highest education level: Not on file  Occupational History  . Occupation: Engineer, production    Comment: retired  Scientific laboratory technician  . Financial resource strain: Not on file  . Food insecurity:    Worry: Not on file    Inability: Not on file  . Transportation needs:    Medical: Not on file    Non-medical: Not on file  Tobacco Use  . Smoking status: Never Smoker  . Smokeless tobacco: Never Used  Substance and Sexual Activity  . Alcohol use: No    Alcohol/week: 0.0 standard drinks  . Drug use: No    Types: Oxycodone  . Sexual activity: Not Currently  Lifestyle  . Physical activity:    Days per week: Not on file    Minutes per session: Not on file  . Stress: Not on file  Relationships  . Social connections:    Talks on phone: Not on file    Gets together: Not on file    Attends religious  service: Not on file    Active member of club or organization: Not on file    Attends meetings of clubs or organizations: Not on file    Relationship status: Not on file  Other Topics Concern  . Not on file  Social History Narrative   HSG, Tim [name] - BS, Tim [name] - MS-engineering, 2 years on PhD - Tim [name]. Married - '65 - 18yr/divorced; '76- 3 yrs/divorced; '92 . No children. 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 Tim [name]/Living Will - Yes CPR; long-term Mechanical ventilation as long as he was able to cognate; ok for long term artificial nutrition. Precondition being able to cognate and not to have too much pain.    Past Surgical History:  Procedure  Laterality Date  . AMPUTATION Left 04/11/2013   Procedure: AMPUTATION DIGIT Left 3rd toe;  Surgeon: [name] MD;  [facility]  Service: Orthopedics;  Laterality: Left;  Left 3rd toe amputation at [facility]  . CARDIAC CATHETERIZATION  1997; 03/10/16   1997 Non-obstructive disease.  03/2016 BMS to RCA, with 25% pDiag dz, o/w normal cors per cath 03/07/16.  Cath 11/01/16: in stent restenosis, successful baloon angioplasty.  .Tim KitchenCARDIAC CATHETERIZATION  12/24/2012   mild < 20% LCx, prox 30% RCA; LVEF 55-65% , moderate pulmonary HTN, moderate AS  . CARDIAC DEFIBRILLATOR PLACEMENT  11/07/2017   [facility] MRI Quad CRT defibrillator  . CARDIOVASCULAR STRESS TEST  05/11/16 ([facility])   Myocardial perfusion imaging:  No ischemia; scar in apex, global hypokinesis, EF 36%.  . Carotid dopplers  03/09/2016   [facility]: no hemodynamically significant stenosis on either side.  . CHOLECYSTECTOMY    . COLONOSCOPY    . COLONOSCOPY [name] 10/30/2012   Procedure: COLONOSCOPY;  Surgeon: CGatha Mayer MD;  Location: [facility];  Service: Endoscopy;  Laterality: [name];  . CORONARY ANGIOPLASTY WITH STENT PLACEMENT  03/2016; 04/2017   2017-[facility]: BMS to RCA-pt was placed on Brilinta.  04/2017: DES to RCA.  .Tim KitchenEYE SURGERY Bilateral cataract  . HEMORRHOID SURGERY    . INTRAOCULAR LENS INSERTION Bilateral   . KNEE SURGERY Right   . LEFT AND RIGHT HEART CATHETERIZATION WITH CORONARY ANGIOGRAM [name] 12/24/2012   Procedure: LEFT AND RIGHT HEART CATHETERIZATION WITH CORONARY ANGIOGRAM;  Surgeon: Peter M JMartinique MD;  [facility];  Service: Cardiovascular;  Laterality: [name];  . LEG SURGERY Bilateral    lenghtening   . PACEMAKER PLACEMENT  04/13/2016   2nd deg HB after TAVR, pt had DC MDT PPM placed.  .Tim KitchenSHOULDER ARTHROSCOPY  08/30/2011   Procedure: ARTHROSCOPY SHOULDER;  Surgeon: [name] MD;  [facility]  Service: Orthopedics;  Laterality: Right;  Right Shoulder Arthroscopy, Debridement, and Decompression  . SPINAL CORD  STIMULATOR INSERTION [name] 09/10/2015   Procedure: LUMBAR SPINAL CORD STIMULATOR INSERTION;  Surgeon: PClydell Hakim MD;  [facility];  Service: Neurosurgery;  Laterality: [name];  . 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  R big toe surg due to osteoarth  . TONSILLECTOMY    . traeculectomy Left    eye  . TRANSCATHETER AORTIC VALVE REPLACEMENT, TRANSFEMORAL  04/11/2016  . TRANSESOPHAGEAL ECHOCARDIOGRAM  03/09/2016   [facility]: EF 55-60%, PFO seen with bi-directional shunting, no thrombus in appendage.  . TRANSTHORACIC ECHOCARDIOGRAM  01/2015; 01/2016; 05/18/16; 09/18/16, 05/2017, 08/2017   01/2015 No signif change in aortic stenosis (moderate).  01/2016 Severe LVH w/small LV cavity, EF 60-65%, grade  I diast dysfxn.  05/2016 (s/p TAVR): EF 50-55%, grd I DD, biopros AV good.  08/2016--EF 50-55%, LV septal motion c/w conduction abnl, grd I DD,mild MS,bioprosth aortic valve well seated, w/trace AR. 05/2017 TTE EF 35%. 08/2017-EF 35%, mod diff hypokin LV, grd I DD, biopros AV good.   . TRANSTHORACIC ECHOCARDIOGRAM     04/2018: EF 40-45%, mod diffuse LV hypokinesis, grd I DD, bioprosth AV well seated, no AS or AR.  Tim [name] VITRECTOMY     Past Medical History:  Diagnosis Date  . ASTHMA   . BPH (benign prostatic hypertrophy)   . CAD (coronary artery disease)    Nonobstructive by 12/2012 cath; then 03/2016 he required BMS to RCA ([facility]).  In-stent restenosis on cath 11/01/16, baloon angioplasty successful--pt to take plavix (no ASA), + eliquis (for PAF).  Tim [name] CATARACT, HX OF   . Chronic combined systolic and diastolic CHF (congestive heart failure) ([place]) 05/2016   Ischemic CM.  04/2018 EF 40-45%, grd I DD.  Tim [name] Chronic pain syndrome    Lumbar DDD; chronic neuropathic pain (DM); has spinal stimulator and sees pain mgmt MD  . COPD   . Diabetes mellitus type 2 with complications (HCC)    BHA1P jumped from 5.7% to 6.1% 03/2015---started metformin at that time.  DM 2 dx by fasting gluc criteria 2018.  Has chronic  neuropath pain  . Elevated transaminase level 02/2016   Suspect due to fatty liver (documented on u/s 2007). Plan repeat labs 03/2016.  Tim [name] Essential hypertension, benign   . Fatty liver 2007   2007 u/s showed fatty liver with hepatosplenomegaly.  2019 repeat u/s->fatty liver but no cirrhosis or hepatosplenomegaly.  Tim [name] GERD (gastroesophageal reflux disease)   . Glaucoma   . GOUT   . Hallux valgus (acquired)   . HH (hiatus hernia)   . HYPERCHOLESTEROLEMIA-PURE   . Hypogonadism male   . Lumbosacral neuritis   . Lumbosacral spondylosis    Lumbar spinal stenosis with neurogenic claudication--contributes to his chronic pain syndrome  . Normal memory function 08/2014   Neuropsychological testing ([facility]): no cognitive impairment or sign of neurodegenerative disorder.  Likely has adjustment d/o with mixed anxiety/depressed features and may benefit from low dose SNRI.    Tim [name] Normocytic anemia 03/2016   Mild-pt needs ferritin and vit B12 level checked (as of 03/22/16)  . NSTEMI (non-ST elevated myocardial infarction) ([place]) 03/20/2016   BMS to RCA  . Obesity hypoventilation syndrome ([place])   . Obesity, unspecified   . Orthostatic hypotension   . OSA on CPAP    8 cm H2O  . OSTEOARTHRITIS   . Paroxysmal atrial fibrillation ([place]) 2003    (? chronic?) Off anticoag for a while due to falls.  Then apixaban started 12/2014.  Tim [name] Peripheral neuropathy    DPN (+[facility]; with chronic neuropathic pain--Dr. Ella Bodo): neuropathic pain->diff to treat, failed nucynta, failed spinal stimul trial, oxycontin hs + tramadol + gabap as of 12/2017 f/u Dr. Letta Pate.  . Personal history of colonic adenoma 10/30/2012   Diminutive adenoma, consider repeat 2019 per GI  . Presence of cardiac defibrillator 11/07/2017  . Primary osteoarthritis of both knees    Bone on bone of medial compartments, + signif patellofemoral arth bilat.--supartz inj series started 09/12/17  . PUD (peptic ulcer disease)   .  PULMONARY HYPERTENSION, HX OF   . Secondary male hypogonadism 2017  . 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  Shortness of breath    with exertion: much improved s/p TAVR and treatment for CHF.  . Sick sinus syndrome (HCC)    PPM placed  . Thrombocytopenia ([place]) 2018   HSM on 2007 abd u/s---suspect some mild splenic sequestration chronically.  Tim [name] Unspecified glaucoma(365.9)   . Unspecified hereditary and idiopathic peripheral neuropathy approx age 44   bilat LE's, ? left arm, too.  Feet became progressively numb + left foot pain intermittently.  Pt may be trying a spinal stimulator (as of 05/2015)  . VENOUS INSUFFICIENCY    Being followed by Dr. Sharol Given as of 10/2016 for two R LL venous stasis ulcers/skin tears.  Healed as of 10/30/16 f/u with Dr. Sharol Given.  . VENTRAL HERNIA    BP (!) 111/56   Pulse 72   Temp 97.8 F (36.6 C)   Ht 6' (1.829 m)   Wt (!) 327 lb (148.3 kg)   SpO2 95%   BMI 44.35 kg/m   Opioid Risk Score:   Fall Risk Score:  `1  Depression screen PHQ 2/9  Depression screen [facility] 2/9 09/20/2018 03/20/2018 02/12/2018 01/15/2018 09/17/2017 08/20/2017 07/18/2017  Decreased Interest 0 0 0 0 0 0 0  Down, Depressed, Hopeless 0 0 0 0 0 0 0  PHQ - 2 Score 0 0 0 0 0 0 0  Altered sleeping - - - - - - -  Tired, decreased energy - - - - - - -  Change in appetite - - - - - - -  Feeling bad or failure about yourself  - - - - - - -  Trouble concentrating - - - - - - -  Moving slowly or fidgety/restless - - - - - - -  Suicidal thoughts - - - - - - -  PHQ-9 Score - - - - - - -  Some recent data might be hidden    Review of Systems  Constitutional: Positive for unexpected weight change.  HENT: Negative.   Eyes: Negative.   Respiratory: Negative.   Cardiovascular: Negative.   Gastrointestinal: Negative.   Endocrine: Negative.   Genitourinary: Positive for dysuria.  Musculoskeletal: Positive for back pain and neck pain.   Skin: Negative.   Allergic/Immunologic: Negative.   Neurological: Positive for dizziness, tremors, weakness, numbness and headaches.       Tingling  Psychiatric/Behavioral: Negative.   All other systems reviewed and are negative.      Objective:   Physical Exam Vitals signs and nursing note reviewed.  Musculoskeletal:     Comments: No Physical Exam: Virtual Visit  Neurological:     Mental Status: He is oriented to person, place, and time.           Assessment & Plan:  1. Diabetic neuropathy with chronic neuropathic pain: Using Tens Unit: Continuecurrent medication withGabapentin 800 mg QID.10/21/2018 Refilled: Oxycodone 10 mg one tablet three times daily as needed may take an extra tablet when pain is sever only 10 days a month #100andContinue:Tramadol 37.5/325mg TID as needed.  We will continue the opioid monitoring program, this consists of regular clinic visits, examinations, urine drug screen, pill counts as well as use of [place] Controlled Substance Reporting System. 2. DDD lumbar spine with LBP: Continue to increase activity as tolerated and heat therapy.10/21/2018. S/P Permanent Spinal Stimulator Placed 09/10/15 via Dr. Maryjean Ka. 3. Bilateral Chronic Knee Pain: No complaints Today.Orthopedist Following.Continue to Monitor.10/21/2018.  F/U in 1 month  [place] Location of patient:In his Home Location of provider: Office Established patient  Time spent on call: 15 minutes

## 2018-10-31 ENCOUNTER — Other Ambulatory Visit: Payer: Self-pay

## 2018-10-31 MED ORDER — NIACIN ER (ANTIHYPERLIPIDEMIC) 1000 MG PO TBCR: 1000.0000 mg | EXTENDED_RELEASE_TABLET | Freq: Two times a day (BID) | ORAL | 1 refills | Status: DC

## 2018-11-18 ENCOUNTER — Encounter: Payer: Self-pay | Admitting: Registered Nurse

## 2018-11-18 ENCOUNTER — Encounter: Payer: Medicare Other | Attending: Registered Nurse | Admitting: Registered Nurse

## 2018-11-18 ENCOUNTER — Other Ambulatory Visit: Payer: Self-pay

## 2018-11-18 VITALS — BP 111/52 | HR 66 | Resp 14 | Ht 72.0 in | Wt 332.0 lb

## 2018-11-18 DIAGNOSIS — K219 Gastro-esophageal reflux disease without esophagitis: Secondary | ICD-10-CM | POA: Insufficient documentation

## 2018-11-18 DIAGNOSIS — G894 Chronic pain syndrome: Secondary | ICD-10-CM

## 2018-11-18 DIAGNOSIS — M48062 Spinal stenosis, lumbar region with neurogenic claudication: Secondary | ICD-10-CM

## 2018-11-18 DIAGNOSIS — J449 Chronic obstructive pulmonary disease, unspecified: Secondary | ICD-10-CM | POA: Insufficient documentation

## 2018-11-18 DIAGNOSIS — M792 Neuralgia and neuritis, unspecified: Secondary | ICD-10-CM

## 2018-11-18 DIAGNOSIS — I2510 Atherosclerotic heart disease of native coronary artery without angina pectoris: Secondary | ICD-10-CM | POA: Insufficient documentation

## 2018-11-18 DIAGNOSIS — E1140 Type 2 diabetes mellitus with diabetic neuropathy, unspecified: Secondary | ICD-10-CM | POA: Diagnosis not present

## 2018-11-18 DIAGNOSIS — G609 Hereditary and idiopathic neuropathy, unspecified: Secondary | ICD-10-CM | POA: Insufficient documentation

## 2018-11-18 DIAGNOSIS — M62830 Muscle spasm of back: Secondary | ICD-10-CM | POA: Diagnosis not present

## 2018-11-18 DIAGNOSIS — M109 Gout, unspecified: Secondary | ICD-10-CM | POA: Insufficient documentation

## 2018-11-18 DIAGNOSIS — M1990 Unspecified osteoarthritis, unspecified site: Secondary | ICD-10-CM | POA: Insufficient documentation

## 2018-11-18 DIAGNOSIS — Z5181 Encounter for therapeutic drug level monitoring: Secondary | ICD-10-CM

## 2018-11-18 DIAGNOSIS — E7800 Pure hypercholesterolemia, unspecified: Secondary | ICD-10-CM | POA: Insufficient documentation

## 2018-11-18 MED ORDER — OXYCODONE HCL 10 MG PO TABS: 10.0000 mg | ORAL_TABLET | Freq: Three times a day (TID) | ORAL | 0 refills | Status: DC | PRN

## 2018-11-18 MED ORDER — TRAMADOL-ACETAMINOPHEN 37.5-325 MG PO TABS: 1.0000 | ORAL_TABLET | Freq: Three times a day (TID) | ORAL | 3 refills | Status: DC | PRN

## 2018-11-18 NOTE — Progress Notes (Signed)
Subjective:    Patient ID: Tim [name], male    DOB: [birth year], 78 y.o.   MRN: [id number]  HPI: Tim [name] is a 78 y.o. male his appointment was changed, due to national recommendations of social distancing due to [place] 19, an audio/video telehealth visit is felt to be most appropriate for this patient at this time.  See Chart message from today for the patient's consent to telehealth from [place].     He  States his pain is located in his lower back and left foot. He rates his pain 3. His  current exercise regime is walking.  Mr. Bonsell Morphine equivalent is 50.00 MME.  Last UDS was Performed on 07/23/2018, it was consistent.   Delana Meyer CMA asked the Health and History Questions. This provider and Mancel Parsons verified we were  speaking with the correct person using two identifiers.  Pain Inventory Average Pain 8 Pain Right Now 3 My pain is intermittent, sharp, burning, dull, stabbing, tingling and aching  In the last 24 hours, has pain interfered with the following? General activity 4 Relation with others 7 Enjoyment of life  What TIME of day is your pain at its worst? daytime, night Sleep (in general) Poor  Pain is worse with: walking, inactivity, standing and some activites Pain improves with: rest, pacing activities and medication Relief from Meds: 7  Mobility walk with assistance use a cane how many minutes can you walk? 3 ability to climb steps?  no do you drive?  yes  Function retired  Neuro/Psych weakness numbness tremor tingling trouble walking  Prior Studies Any changes since last visit?  no  Physicians involved in your care Any changes since last visit?  no   Family History  Problem Relation Age of Onset  . Hypertension Mother   . Coronary artery disease Mother   . Heart attack Mother   . Neuropathy Mother   . Pulmonary fibrosis Father        asbestosis   Social History    Socioeconomic History  . Marital status: Married    Spouse name: Not on file  . Number of children: 0  . Years of education: 48  . Highest education level: Not on file  Occupational History  . Occupation: Engineer, production    Comment: retired  Scientific laboratory technician  . Financial resource strain: Not on file  . Food insecurity:    Worry: Not on file    Inability: Not on file  . Transportation needs:    Medical: Not on file    Non-medical: Not on file  Tobacco Use  . Smoking status: Never Smoker  . Smokeless tobacco: Never Used  Substance and Sexual Activity  . Alcohol use: No    Alcohol/week: 0.0 standard drinks  . Drug use: No    Types: Oxycodone  . Sexual activity: Not Currently  Lifestyle  . Physical activity:    Days per week: Not on file    Minutes per session: Not on file  . Stress: Not on file  Relationships  . Social connections:    Talks on phone: Not on file    Gets together: Not on file    Attends religious service: Not on file    Active member of club or organization: Not on file    Attends meetings of clubs or organizations: Not on file    Relationship status: Not on file  Other Topics Concern  . Not on file  Social History Narrative   HSG, Tim [name] - BS, [name] - MS-engineering, 2 years on PhD - [name]. Married - '65 - 81yr/divorced; '76- 3 yrs/divorced; '92 . No children. 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 ACP/Living Will - Yes CPR; long-term Mechanical ventilation as long as he was able to cognate; ok for long term artificial nutrition. Precondition being able to cognate and not to have too much pain.    Past Surgical History:  Procedure Laterality Date  . AMPUTATION Left 04/11/2013   Procedure: AMPUTATION DIGIT Left 3rd toe;  Surgeon: [name] MD;  Location: [facility]  Service: Orthopedics;  Laterality: Left;  Left 3rd toe amputation at [facility]  . CARDIAC CATHETERIZATION  1997; 03/10/16   1997 Non-obstructive  disease.  03/2016 BMS to RCA, with 25% pDiag dz, o/w normal cors per cath 03/07/16.  Cath 11/01/16: in stent restenosis, successful baloon angioplasty.  .Marland KitchenCARDIAC CATHETERIZATION  12/24/2012   mild < 20% LCx, prox 30% RCA; LVEF 55-65% , moderate pulmonary HTN, moderate AS  . CARDIAC DEFIBRILLATOR PLACEMENT  11/07/2017   [facility] MRI Quad CRT defibrillator  . CARDIOVASCULAR STRESS TEST  05/11/16 ([facility])   Myocardial perfusion imaging:  No ischemia; scar in apex, global hypokinesis, EF 36%.  . Carotid dopplers  03/09/2016   [facility]: no hemodynamically significant stenosis on either side.  . CHOLECYSTECTOMY    . COLONOSCOPY    . COLONOSCOPY [name] 10/30/2012   Procedure: COLONOSCOPY;  Surgeon: CGatha Mayer MD;  Location: [facility];  Service: Endoscopy;  Laterality: [name];  . CORONARY ANGIOPLASTY WITH STENT PLACEMENT  03/2016; 04/2017   2017-[facility]: BMS to RCA-pt was placed on Brilinta.  04/2017: DES to RCA.  .Marland KitchenEYE SURGERY Bilateral cataract  . HEMORRHOID SURGERY    . INTRAOCULAR LENS INSERTION Bilateral   . KNEE SURGERY Right   . LEFT AND RIGHT HEART CATHETERIZATION WITH CORONARY ANGIOGRAM [name] 12/24/2012   Procedure: LEFT AND RIGHT HEART CATHETERIZATION WITH CORONARY ANGIOGRAM;  Surgeon: Peter M JMartinique MD;  [facility];  Service: Cardiovascular;  Laterality: [name];  . LEG SURGERY Bilateral    lenghtening   . PACEMAKER PLACEMENT  04/13/2016   2nd deg HB after TAVR, pt had DC MDT PPM placed.  .Marland KitchenSHOULDER ARTHROSCOPY  08/30/2011   Procedure: ARTHROSCOPY SHOULDER;  Surgeon: [name] MD;  Location[facility]  Service: Orthopedics;  Laterality: Right;  Right Shoulder Arthroscopy, Debridement, and Decompression  . SPINAL CORD STIMULATOR INSERTION [name] 09/10/2015   Procedure: LUMBAR SPINAL CORD STIMULATOR INSERTION;  Surgeon: PClydell Hakim MD;  [facility];  Service: Neurosurgery;  Laterality: [name];  . 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  R big toe surg due to osteoarth  .  TONSILLECTOMY    . traeculectomy Left    eye  . TRANSCATHETER AORTIC VALVE REPLACEMENT, TRANSFEMORAL  04/11/2016  . TRANSESOPHAGEAL ECHOCARDIOGRAM  03/09/2016   [facility]: EF 55-60%, PFO seen with bi-directional shunting, no thrombus in appendage.  . TRANSTHORACIC ECHOCARDIOGRAM  01/2015; 01/2016; 05/18/16; 09/18/16, 05/2017, 08/2017   01/2015 No signif change in aortic stenosis (moderate).  01/2016 Severe LVH w/small LV cavity, EF 60-65%, grade I diast dysfxn.  05/2016 (s/p TAVR): EF 50-55%, grd I DD, biopros AV good.  08/2016--EF 50-55%, LV septal motion c/w conduction abnl, grd I DD,mild MS,bioprosth aortic valve well seated, w/trace AR. 05/2017 TTE EF 35%. 08/2017-EF 35%, mod diff hypokin LV, grd I  DD, biopros AV good.   . TRANSTHORACIC ECHOCARDIOGRAM     04/2018: EF 40-45%, mod diffuse LV hypokinesis, grd I DD, bioprosth AV well seated, no AS or AR.  [name] VITRECTOMY     Past Medical History:  Diagnosis Date  . ASTHMA   . BPH (benign prostatic hypertrophy)   . CAD (coronary artery disease)    Nonobstructive by 12/2012 cath; then 03/2016 he required BMS to RCA ([facility]).  In-stent restenosis on cath 11/01/16, baloon angioplasty successful--pt to take plavix (no ASA), + eliquis (for PAF).  [name] CATARACT, HX OF   . Chronic combined systolic and diastolic CHF (congestive heart failure) ([employer]) 05/2016   Ischemic CM.  04/2018 EF 40-45%, grd I DD.  [name] Chronic pain syndrome    Lumbar DDD; chronic neuropathic pain (DM); has spinal stimulator and sees pain mgmt MD  . COPD   . Diabetes mellitus type 2 with complications (HCC)    JTT0V jumped from 5.7% to 6.1% 03/2015---started metformin at that time.  DM 2 dx by fasting gluc criteria 2018.  Has chronic neuropath pain  . Elevated transaminase level 02/2016   Suspect due to fatty liver (documented on u/s 2007). Plan repeat labs 03/2016.  [name] Essential hypertension, benign   . Fatty liver 2007   2007 u/s showed fatty liver with hepatosplenomegaly.  2019 repeat u/s->fatty  liver but no cirrhosis or hepatosplenomegaly.  [name] GERD (gastroesophageal reflux disease)   . Glaucoma   . GOUT   . Hallux valgus (acquired)   . HH (hiatus hernia)   . HYPERCHOLESTEROLEMIA-PURE   . Hypogonadism male   . Lumbosacral neuritis   . Lumbosacral spondylosis    Lumbar spinal stenosis with neurogenic claudication--contributes to his chronic pain syndrome  . Normal memory function 08/2014   Neuropsychological testing ([facility]): no cognitive impairment or sign of neurodegenerative disorder.  Likely has adjustment d/o with mixed anxiety/depressed features and may benefit from low dose SNRI.    [name] Normocytic anemia 03/2016   Mild-pt needs ferritin and vit B12 level checked (as of 03/22/16)  . NSTEMI (non-ST elevated myocardial infarction) ([place]) 03/20/2016   BMS to RCA  . Obesity hypoventilation syndrome ([place])   . Obesity, unspecified   . Orthostatic hypotension   . OSA on CPAP    8 cm H2O  . OSTEOARTHRITIS   . Paroxysmal atrial fibrillation ([place]) 2003    (? chronic?) Off anticoag for a while due to falls.  Then apixaban started 12/2014.  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 Ella Bodo): neuropathic pain->diff to treat, failed nucynta, failed spinal stimul trial, oxycontin hs + tramadol + gabap as of 12/2017 f/u Dr. Letta Pate.  . Personal history of colonic adenoma 10/30/2012   Diminutive adenoma, consider repeat 2019 per GI  . Presence of cardiac defibrillator 11/07/2017  . Primary osteoarthritis of both knees    Bone on bone of medial compartments, + signif patellofemoral arth bilat.--supartz inj series started 09/12/17  . PUD (peptic ulcer disease)   . PULMONARY HYPERTENSION, HX OF   . Secondary male hypogonadism 2017  . 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 Shortness of breath    with exertion: much improved s/p TAVR and treatment for CHF.  . Sick sinus  syndrome (HCC)    PPM placed  . Thrombocytopenia ([place]) 2018   HSM on 2007 abd u/s---suspect some mild splenic sequestration chronically.  [name]  Unspecified glaucoma(365.9)   . Unspecified hereditary and idiopathic peripheral neuropathy approx age 37   bilat LE's, ? left arm, too.  Feet became progressively numb + left foot pain intermittently.  Pt may be trying a spinal stimulator (as of 05/2015)  . VENOUS INSUFFICIENCY    Being followed by Dr. Sharol Given as of 10/2016 for two R LL venous stasis ulcers/skin tears.  Healed as of 10/30/16 f/u with Dr. Sharol Given.  . VENTRAL HERNIA    BP (!) 111/52   Pulse 66   Resp 14   Ht 6' (1.829 m)   Wt (!) 332 lb (150.6 kg)   SpO2 97%   BMI 45.03 kg/m   Opioid Risk Score:   Fall Risk Score:  `1  Depression screen PHQ 2/9  Depression screen [facility] 2/9 09/20/2018 03/20/2018 02/12/2018 01/15/2018 09/17/2017 08/20/2017 07/18/2017  Decreased Interest 0 0 0 0 0 0 0  Down, Depressed, Hopeless 0 0 0 0 0 0 0  PHQ - 2 Score 0 0 0 0 0 0 0  Altered sleeping - - - - - - -  Tired, decreased energy - - - - - - -  Change in appetite - - - - - - -  Feeling bad or failure about yourself  - - - - - - -  Trouble concentrating - - - - - - -  Moving slowly or fidgety/restless - - - - - - -  Suicidal thoughts - - - - - - -  PHQ-9 Score - - - - - - -  Some recent data might be hidden    Review of Systems  Constitutional: Negative.   HENT: Negative.   Eyes: Negative.   Respiratory: Negative.   Cardiovascular: Negative.   Gastrointestinal: Negative.   Endocrine: Negative.   Genitourinary: Negative.   Musculoskeletal: Positive for arthralgias, back pain and gait problem.  Skin: Negative.   Allergic/Immunologic: Negative.   Neurological: Positive for tremors, weakness and numbness.       Tingling   Hematological: Negative.   Psychiatric/Behavioral: Negative.   All other systems reviewed and are negative.      Objective:   Physical Exam Vitals signs and nursing note  reviewed.  Musculoskeletal:     Comments: No Physical Exam Performed: Virtual Visit  Neurological:     Mental Status: He is oriented to person, place, and time.           Assessment & Plan:  1. Diabetic neuropathy with chronic neuropathic pain: Using Tens Unit: Continuecurrent medication withGabapentin 800 mg QID.11/18/2018 Refilled: Oxycodone 10 mg one tablet three times daily as needed may take an extra tablet when pain is sever only 10 days a month #100andContinue:Tramadol 37.5/325mg TID as needed.  We will continue the opioid monitoring program, this consists of regular clinic visits, examinations, urine drug screen, pill counts as well as use of [place] Controlled Substance Reporting System. 2. DDD lumbar spine with LBP: Continue to increase activity as tolerated and heat therapy.11/18/2018. S/P Permanent Spinal Stimulator Placed 09/10/15 via Dr. Maryjean Ka. 3. Bilateral Chronic Knee Pain: No complaints Today.Orthopedist Following.Continue to Monitor.11/18/2018.  F/U in 1 month  [employer] Location of patient: In his Home Location of provider: Office Established patient Time spent on call: 10 Minutes

## 2018-11-22 ENCOUNTER — Encounter: Payer: Self-pay | Admitting: Family Medicine

## 2018-12-12 ENCOUNTER — Other Ambulatory Visit: Payer: Self-pay | Admitting: Family Medicine

## 2018-12-17 ENCOUNTER — Other Ambulatory Visit: Payer: Self-pay | Admitting: Family Medicine

## 2018-12-19 ENCOUNTER — Encounter: Payer: Medicare Other | Attending: Registered Nurse | Admitting: Physical Medicine & Rehabilitation

## 2018-12-19 ENCOUNTER — Other Ambulatory Visit: Payer: Self-pay

## 2018-12-19 ENCOUNTER — Encounter: Payer: Self-pay | Admitting: Physical Medicine & Rehabilitation

## 2018-12-19 VITALS — BP 111/60 | HR 71 | Temp 98.2°F | Ht 72.0 in | Wt 322.0 lb

## 2018-12-19 DIAGNOSIS — M1990 Unspecified osteoarthritis, unspecified site: Secondary | ICD-10-CM | POA: Insufficient documentation

## 2018-12-19 DIAGNOSIS — M48062 Spinal stenosis, lumbar region with neurogenic claudication: Secondary | ICD-10-CM

## 2018-12-19 DIAGNOSIS — M109 Gout, unspecified: Secondary | ICD-10-CM | POA: Insufficient documentation

## 2018-12-19 DIAGNOSIS — G609 Hereditary and idiopathic neuropathy, unspecified: Secondary | ICD-10-CM | POA: Diagnosis not present

## 2018-12-19 DIAGNOSIS — E7800 Pure hypercholesterolemia, unspecified: Secondary | ICD-10-CM | POA: Insufficient documentation

## 2018-12-19 DIAGNOSIS — K219 Gastro-esophageal reflux disease without esophagitis: Secondary | ICD-10-CM | POA: Insufficient documentation

## 2018-12-19 DIAGNOSIS — J449 Chronic obstructive pulmonary disease, unspecified: Secondary | ICD-10-CM | POA: Insufficient documentation

## 2018-12-19 DIAGNOSIS — I2510 Atherosclerotic heart disease of native coronary artery without angina pectoris: Secondary | ICD-10-CM | POA: Insufficient documentation

## 2018-12-19 NOTE — Patient Instructions (Signed)
Please call us if you need a refill of the gabapentin.  Also please call us if there is any exacerbation of your low back pain.

## 2018-12-19 NOTE — Progress Notes (Signed)
Subjective:    Patient ID: Tim [name], male    DOB: 1940-08-30, 78 y.o.   MRN: [id number] Attempted WebEx visit but patient could not join the meeting. Instead performed phone visit due to COVID restrictions and the patient's elevated COVID 19 risk of complication score: 57 HPI 78 year old male with history of a hereditary peripheral  neuropathy as well as lumbar spondylosis. He has responded x1 to S1  transforaminal injection, however, subsequent repeats were not  successful. He has done some type core strengthening exercises but this has not been consistently performed and patient feels that sometimes exercise generally increases his back pain. The patient states that his back has actually been doing quite well and he has weaned himself off of both oxycodone and tramadol. He did this slowly and did not experience any withdrawal reaction. He feels like his back pain is about the same on or off these medications. He continues to have lower extremity pain. He has undergone spinal cord stimulation trial which was successful however the implantation of the permanent device was not.  We discussed that in the future he may want to consider another system and discussed this with Dr. Maryjean Ka.  The patient states that due to the COVID restrictions he would like to hold off on any type of invasive procedure. Pain Inventory Average Pain 6 Pain Right Now 7 My pain is intermittent, dull and aching  In the last 24 hours, has pain interfered with the following? General activity 0 Relation with others 0 Enjoyment of life 0 What TIME of day is your pain at its worst? night Sleep (in general) Poor  Pain is worse with: walking, bending, standing and some activites Pain improves with: rest, pacing activities and medication Relief from Meds: 3  Mobility use a cane ability to climb steps?  no do you drive?  yes  Function retired I need assistance with the following:  household duties and  shopping  Neuro/Psych weakness numbness tremor tingling trouble walking  Prior Studies Any changes since last visit?  no  Physicians involved in your care Any changes since last visit?  no   Family History  Problem Relation Age of Onset  . Hypertension Mother   . Coronary artery disease Mother   . Heart attack Mother   . Neuropathy Mother   . Pulmonary fibrosis Father        asbestosis   Social History   Socioeconomic History  . Marital status: Married    Spouse name: Not on file  . Number of children: 0  . Years of education: 62  . Highest education level: Not on file  Occupational History  . Occupation: Engineer, production    Comment: retired  Scientific laboratory technician  . Financial resource strain: Not on file  . Food insecurity    Worry: Not on file    Inability: Not on file  . Transportation needs    Medical: Not on file    Non-medical: Not on file  Tobacco Use  . Smoking status: Never Smoker  . Smokeless tobacco: Never Used  Substance and Sexual Activity  . Alcohol use: No    Alcohol/week: 0.0 standard drinks  . Drug use: No    Types: Oxycodone  . Sexual activity: Not Currently  Lifestyle  . Physical activity    Days per week: Not on file    Minutes per session: Not on file  . Stress: Not on file  Relationships  . Social Herbalist on  phone: Not on file    Gets together: Not on file    Attends religious service: Not on file    Active member of club or organization: Not on file    Attends meetings of clubs or organizations: Not on file    Relationship status: Not on file  Other Topics Concern  . Not on file  Social History Narrative   HSG, [name] - BS, [name] - MS-engineering, 2 years on PhD - [place]. Married - '65 - 35yr/divorced; '76- 3 yrs/divorced; '92 . No children. 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 ACP/Living Will - Yes CPR; long-term Mechanical ventilation as long as he was able to  cognate; ok for long term artificial nutrition. Precondition being able to cognate and not to have too much pain.    Past Surgical History:  Procedure Laterality Date  . AMPUTATION Left 04/11/2013   Procedure: AMPUTATION DIGIT Left 3rd toe;  Surgeon: [name] MD;  Location: [facility]  Service: Orthopedics;  Laterality: Left;  Left 3rd toe amputation at [facility]  . CARDIAC CATHETERIZATION  1997; 03/10/16   1997 Non-obstructive disease.  03/2016 BMS to RCA, with 25% pDiag dz, o/w normal cors per cath 03/07/16.  Cath 11/01/16: in stent restenosis, successful baloon angioplasty.  .Marland KitchenCARDIAC CATHETERIZATION  12/24/2012   mild < 20% LCx, prox 30% RCA; LVEF 55-65% , moderate pulmonary HTN, moderate AS  . CARDIAC DEFIBRILLATOR PLACEMENT  11/07/2017   [facility] MRI Quad CRT defibrillator  . CARDIOVASCULAR STRESS TEST  05/11/16 ([facility])   Myocardial perfusion imaging:  No ischemia; scar in apex, global hypokinesis, EF 36%.  . Carotid dopplers  03/09/2016   [facility]: no hemodynamically significant stenosis on either side.  . CHOLECYSTECTOMY    . COLONOSCOPY    . COLONOSCOPY [name] 10/30/2012   Procedure: COLONOSCOPY;  Surgeon: CGatha Mayer MD;  Location: [facility];  Service: Endoscopy;  Laterality: [name];  . CORONARY ANGIOPLASTY WITH STENT PLACEMENT  03/2016; 04/2017   2017-[facility]: BMS to RCA-pt was placed on Brilinta.  04/2017: DES to RCA.  .Marland KitchenEYE SURGERY Bilateral cataract  . HEMORRHOID SURGERY    . INTRAOCULAR LENS INSERTION Bilateral   . KNEE SURGERY Right   . LEFT AND RIGHT HEART CATHETERIZATION WITH CORONARY ANGIOGRAM [name] 12/24/2012   Procedure: LEFT AND RIGHT HEART CATHETERIZATION WITH CORONARY ANGIOGRAM;  Surgeon: Peter M JMartinique MD;  [facility];  Service: Cardiovascular;  Laterality: [name];  . LEG SURGERY Bilateral    lenghtening   . PACEMAKER PLACEMENT  04/13/2016   2nd deg HB after TAVR, pt had DC MDT PPM placed.  .Marland KitchenSHOULDER ARTHROSCOPY  08/30/2011   Procedure: ARTHROSCOPY SHOULDER;  Surgeon:  [name] MD;  [facility]  Service: Orthopedics;  Laterality: Right;  Right Shoulder Arthroscopy, Debridement, and Decompression  . SPINAL CORD STIMULATOR INSERTION [name] 09/10/2015   Procedure: LUMBAR SPINAL CORD STIMULATOR INSERTION;  Surgeon: PClydell Hakim MD;  [facility];  Service: Neurosurgery;  Laterality: [name];  . 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  R big toe surg due to osteoarth  . TONSILLECTOMY    . traeculectomy Left    eye  . TRANSCATHETER AORTIC VALVE REPLACEMENT, TRANSFEMORAL  04/11/2016  . TRANSESOPHAGEAL ECHOCARDIOGRAM  03/09/2016   [facility]: EF 55-60%, PFO seen with bi-directional shunting, no thrombus in appendage.  . TRANSTHORACIC ECHOCARDIOGRAM  01/2015; 01/2016; 05/18/16; 09/18/16, 05/2017, 08/2017   01/2015  No signif change in aortic stenosis (moderate).  01/2016 Severe LVH w/small LV cavity, EF 60-65%, grade I diast dysfxn.  05/2016 (s/p TAVR): EF 50-55%, grd I DD, biopros AV good.  08/2016--EF 50-55%, LV septal motion c/w conduction abnl, grd I DD,mild MS,bioprosth aortic valve well seated, w/trace AR. 05/2017 TTE EF 35%. 08/2017-EF 35%, mod diff hypokin LV, grd I DD, biopros AV good.   . TRANSTHORACIC ECHOCARDIOGRAM     04/2018: EF 40-45%, mod diffuse LV hypokinesis, grd I DD, bioprosth AV well seated, no AS or AR.  [name] VITRECTOMY     Past Medical History:  Diagnosis Date  . ASTHMA   . BPH (benign prostatic hypertrophy)   . CAD (coronary artery disease)    Nonobstructive by 12/2012 cath; then 03/2016 he required BMS to RCA ([facility]).  In-stent restenosis on cath 11/01/16, baloon angioplasty successful--pt to take plavix (no ASA), + eliquis (for PAF).  [name] CATARACT, HX OF   . Chronic combined systolic and diastolic CHF (congestive heart failure) ([place]) 05/2016   Ischemic CM.  04/2018 EF 40-45%, grd I DD.  [name] Chronic pain syndrome    Lumbar DDD; chronic neuropathic pain (DM); has spinal stimulator and sees pain mgmt MD  . COPD   . Diabetes mellitus type 2  with complications (HCC)    ZOX0R jumped from 5.7% to 6.1% 03/2015---started metformin at that time.  DM 2 dx by fasting gluc criteria 2018.  Has chronic neuropath pain  . Elevated transaminase level 02/2016   Suspect due to fatty liver (documented on u/s 2007). Plan repeat labs 03/2016.  [name] Essential hypertension, benign   . Fatty liver 2007   2007 u/s showed fatty liver with hepatosplenomegaly.  2019 repeat u/s->fatty liver but no cirrhosis or hepatosplenomegaly.  [name] GERD (gastroesophageal reflux disease)   . Glaucoma   . GOUT   . Hallux valgus (acquired)   . HH (hiatus hernia)   . HYPERCHOLESTEROLEMIA-PURE   . Hypogonadism male   . Lumbosacral neuritis   . Lumbosacral spondylosis    Lumbar spinal stenosis with neurogenic claudication--contributes to his chronic pain syndrome  . Normal memory function 08/2014   Neuropsychological testing ([facility]): no cognitive impairment or sign of neurodegenerative disorder.  Likely has adjustment d/o with mixed anxiety/depressed features and may benefit from low dose SNRI.    [name] Normocytic anemia 03/2016   Mild-pt needs ferritin and vit B12 level checked (as of 03/22/16)  . NSTEMI (non-ST elevated myocardial infarction) ([place]) 03/20/2016   BMS to RCA  . Obesity hypoventilation syndrome ([place])   . Obesity, unspecified   . Orthostatic hypotension   . OSA on CPAP    8 cm H2O  . OSTEOARTHRITIS   . Paroxysmal atrial fibrillation ([place]) 2003    (? chronic?) Off anticoag for a while due to falls.  Then apixaban started 12/2014.  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 Ella Bodo): neuropathic pain->diff to treat, failed nucynta, failed spinal stimul trial, oxycontin hs + tramadol + gabap as of 12/2017 f/u Dr. Letta Pate.  . Personal history of colonic adenoma 10/30/2012   Diminutive adenoma, consider repeat 2019 per GI  . Presence of cardiac defibrillator 11/07/2017  . Primary osteoarthritis of both knees     Bone on bone of medial compartments, + signif patellofemoral arth bilat.--supartz inj series started 09/12/17  . PUD (peptic ulcer disease)   . PULMONARY HYPERTENSION, HX OF   . Secondary male hypogonadism 2017  . Severe aortic stenosis  by cath by [facility] 03/2016; CT surgery saw him and did TAVR 04/11/16 ([facility])  . Shortness of breath    with exertion: much improved s/p TAVR and treatment for CHF.  . Sick sinus syndrome (HCC)    PPM placed  . Thrombocytopenia ([place]) 2018   HSM on 2007 abd u/s---suspect some mild splenic sequestration chronically.  [name] Unspecified glaucoma(365.9)   . Unspecified hereditary and idiopathic peripheral neuropathy approx age 8   bilat LE's, ? left arm, too.  Feet became progressively numb + left foot pain intermittently.  Pt may be trying a spinal stimulator (as of 05/2015)  . VENOUS INSUFFICIENCY    Being followed by Dr. Sharol Given as of 10/2016 for two R LL venous stasis ulcers/skin tears.  Healed as of 10/30/16 f/u with Dr. Sharol Given.  . VENTRAL HERNIA    There were no vitals taken for this visit.  Opioid Risk Score:   Fall Risk Score:  `1  Depression screen PHQ 2/9  Depression screen [facility] 2/9 09/20/2018 03/20/2018 02/12/2018 01/15/2018 09/17/2017 08/20/2017 07/18/2017  Decreased Interest 0 0 0 0 0 0 0  Down, Depressed, Hopeless 0 0 0 0 0 0 0  PHQ - 2 Score 0 0 0 0 0 0 0  Altered sleeping - - - - - - -  Tired, decreased energy - - - - - - -  Change in appetite - - - - - - -  Feeling bad or failure about yourself  - - - - - - -  Trouble concentrating - - - - - - -  Moving slowly or fidgety/restless - - - - - - -  Suicidal thoughts - - - - - - -  PHQ-9 Score - - - - - - -  Some recent data might be hidden     Review of Systems  Constitutional: Negative.   HENT: Negative.   Eyes: Negative.   Respiratory: Negative.   Cardiovascular: Negative.   Gastrointestinal: Negative.   Endocrine: Negative.   Genitourinary: Negative.   Musculoskeletal: Positive for  arthralgias, back pain, gait problem and myalgias.  Skin: Negative.   Allergic/Immunologic: Negative.   Neurological: Positive for tremors, weakness and numbness.  Hematological: Negative.   Psychiatric/Behavioral: Negative.   All other systems reviewed and are negative.      Objective:   Physical Exam Neurological:     Mental Status: He is alert and oriented to person, place, and time. Mental status is at baseline.     Cranial Nerves: No dysarthria.  Psychiatric:        Mood and Affect: Mood normal.        Behavior: Behavior normal.        Thought Content: Thought content normal.    Remainder examination limited secondary to phone visit       Assessment & Plan:  1.  Chronic low back pain with lumbar spondylosis.  He has done quite well and has weaned himself off his narcotic analgesics with no increase in his chronic low back pain He would like to stay off these medications as possible.  He does have both tramadol and oxycodone at home close to 1 month supply which she could use on a as needed basis but thus far has not taken any narcotic pain medications in 2 weeks.  2.  Chronic bilateral lower extremity pain he has a history of peripheral neuropathy of unknown etiology likely hereditary.  He has undergone permanent spinal cord stimulator implant after a successful trial  but did not have much success with his system.  He plans to follow-up with Dr. Maryjean Ka once coronavirus restrictions are lifted. In the meantime he will reduce his gabapentin from 800 4 times daily to 800 3 times daily. We discussed that we should still see him on a yearly basis to prescribe this medication and he can certainly call us on a as needed basis.  Duration of phone visit 13 minutes

## 2018-12-25 ENCOUNTER — Encounter: Payer: Self-pay | Admitting: Family Medicine

## 2018-12-25 ENCOUNTER — Ambulatory Visit: Payer: Medicare Other | Admitting: Family Medicine

## 2018-12-25 ENCOUNTER — Other Ambulatory Visit: Payer: Self-pay

## 2018-12-25 VITALS — BP 104/69 | HR 75 | Temp 97.3°F | Resp 14 | Ht 72.0 in | Wt 338.2 lb

## 2018-12-25 DIAGNOSIS — E7800 Pure hypercholesterolemia, unspecified: Secondary | ICD-10-CM

## 2018-12-25 DIAGNOSIS — G894 Chronic pain syndrome: Secondary | ICD-10-CM

## 2018-12-25 DIAGNOSIS — I5042 Chronic combined systolic (congestive) and diastolic (congestive) heart failure: Secondary | ICD-10-CM | POA: Diagnosis not present

## 2018-12-25 DIAGNOSIS — R635 Abnormal weight gain: Secondary | ICD-10-CM | POA: Diagnosis not present

## 2018-12-25 DIAGNOSIS — N3000 Acute cystitis without hematuria: Secondary | ICD-10-CM

## 2018-12-25 DIAGNOSIS — E119 Type 2 diabetes mellitus without complications: Secondary | ICD-10-CM

## 2018-12-25 LAB — LIPID PANEL
Cholesterol: 107 mg/dL (ref 0–200)
HDL: 62.7 mg/dL (ref >39.00)
LDL Cholesterol: 29 mg/dL (ref 0–99)
NonHDL: 43.84
Total CHOL/HDL Ratio: 2
Triglycerides: 72 mg/dL (ref 0.0–149.0)
VLDL: 14.4 mg/dL (ref 0.0–40.0)

## 2018-12-25 LAB — POCT URINALYSIS DIPSTICK
Bilirubin, UA: NEGATIVE
Blood, UA: NEGATIVE
Glucose, UA: NEGATIVE
Ketones, UA: NEGATIVE
Nitrite, UA: NEGATIVE
Protein, UA: POSITIVE — AB
Spec Grav, UA: 1.02 (ref 1.010–1.025)
Urobilinogen, UA: 0.2 E.U./dL
pH, UA: 6 (ref 5.0–8.0)

## 2018-12-25 LAB — BASIC METABOLIC PANEL
BUN: 16 mg/dL (ref 6–23)
CO2: 29 mEq/L (ref 19–32)
Calcium: 8.8 mg/dL (ref 8.4–10.5)
Chloride: 102 mEq/L (ref 96–112)
Creatinine, Ser: 0.75 mg/dL (ref 0.40–1.50)
GFR: 100.44 mL/min (ref >60.00)
Glucose, Bld: 115 mg/dL — ABNORMAL HIGH (ref 70–99)
Potassium: 4.3 mEq/L (ref 3.5–5.1)
Sodium: 140 mEq/L (ref 135–145)

## 2018-12-25 MED ORDER — CEFDINIR 300 MG PO CAPS: 300.0000 mg | ORAL_CAPSULE | Freq: Two times a day (BID) | ORAL | 0 refills | Status: AC

## 2018-12-25 NOTE — Progress Notes (Signed)
OFFICE VISIT  12/25/2018   CC:  Chief Complaint  Patient presents with  . Follow-up    RCI, pt is fasting   HPI:    Patient is a 78 y.o. Caucasian male who presents for 3 mo f/u DM 2, HLD, and peripheral edema.  Of note, he has an extensive cardiac history, for which he is followed by [facility] (Dr. Radford Pax) in [place]-->CAD w/stents, PAF/, chronic combined systolic and diastolic HF, and hx of severe aortic stenosis (now s/p TAVR). He was recently upgraded to a biventricular ICD for primary prevention.   Also has OHS, OSA, and COPD, for which he is followed by [name] [place].  He has had a longterm history of morbid obesity, chronic pain syndrome (has pain mgmt MD), orthostatic hypotension/dizziness, and peripheral edema.   Back not bad anymore so he has weened himself off of all opioids + tramadol and asks that this be mentioned in the chart and also these meds taken off his med list. Bilat LL PN pain progressive despite all treatments.  He plans on having a spinal cord stimulator replaced with one made by a different manufacturer in the future. I'll assume responsibility of gabapentin rx'ing from here on out.  Feeling fine otherwise.  Not checking any glucoses at home.  Sticking with diabetic diet about 50% of the time.  Takes his statin daily w/out side effect.  He has not seen his specialists since I last saw him for routine f/u 3 mo ago. He weighs himself at home, most recently 1 week ago (naked) and wt was 321 lbs. He does not feel like he is fluid overloaded.  Right leg a little swollen today and he intended on taking torsemide tonight.  He has not taken this med in about 2 months. Doesn't feel like abdomen is distended, feels no new DOE.  No SOB at rest. No coughing or wheezing or fevers.  He does have periods of weeks at a time in which he eats/drinks poorly, then follows this with weeks of eating and drinking.  The last 2 wks he has been eating and  drinking well. He does try to minimize sodium intake.  He does have UTI sx's: has had a few weeks of intermittent burning at tip of penis when he urinates.  Possibly some increased urgency.  Has trouble stopping his urine stream. Last 3-4 days worsening sx's.  He is circumcized. At baseline he has some decreased force of urinary stream but no problem starting urination, no significant nocturia but possibly some daytime frequency.  Denies prostate pain.  Feels normal/complete bladder emptying.  Past Medical History:  Diagnosis Date  . ASTHMA   . BPH (benign prostatic hypertrophy)   . CAD (coronary artery disease)    Nonobstructive by 12/2012 cath; then 03/2016 he required BMS to RCA ([facility]).  In-stent restenosis on cath 11/01/16, baloon angioplasty successful--pt to take plavix (no ASA), + eliquis (for PAF).  [name] CATARACT, HX OF   . Chronic combined systolic and diastolic CHF (congestive heart failure) ([place]) 05/2016   Ischemic CM.  04/2018 EF 40-45%, grd I DD.  [name] Chronic pain syndrome    Lumbar DDD; chronic neuropathic pain (DM); has spinal stimulator and sees pain mgmt MD  . COPD   . Diabetes mellitus type 2 with complications (HCC)    ERD4Y jumped from 5.7% to 6.1% 03/2015---started metformin at that time.  DM 2 dx by fasting gluc criteria 2018.  Has chronic neuropath pain  .  Elevated transaminase level 02/2016   Suspect due to fatty liver (documented on u/s 2007). Plan repeat labs 03/2016.  [name] Essential hypertension, benign   . Fatty liver 2007   2007 u/s showed fatty liver with hepatosplenomegaly.  2019 repeat u/s->fatty liver but no cirrhosis or hepatosplenomegaly.  [name] GERD (gastroesophageal reflux disease)   . Glaucoma   . GOUT   . Hallux valgus (acquired)   . HH (hiatus hernia)   . HYPERCHOLESTEROLEMIA-PURE   . Hypogonadism male   . Lumbosacral neuritis   . Lumbosacral spondylosis    Lumbar spinal stenosis with neurogenic claudication--contributes to his chronic pain syndrome  .  Normal memory function 08/2014   Neuropsychological testing ([facility]): no cognitive impairment or sign of neurodegenerative disorder.  Likely has adjustment d/o with mixed anxiety/depressed features and may benefit from low dose SNRI.    [name] Normocytic anemia 03/2016   Mild-pt needs ferritin and vit B12 level checked (as of 03/22/16)  . NSTEMI (non-ST elevated myocardial infarction) ([place]) 03/20/2016   BMS to RCA  . Obesity hypoventilation syndrome ([place])   . Obesity, unspecified   . Orthostatic hypotension   . OSA on CPAP    8 cm H2O  . OSTEOARTHRITIS   . Paroxysmal atrial fibrillation ([place]) 2003    (? chronic?) Off anticoag for a while due to falls.  Then apixaban started 12/2014.  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 Ella Bodo): neuropathic pain->diff to treat, failed nucynta, failed spinal stimul trial, oxycontin hs + tramadol + gabap as of 12/2017 f/u Dr. Letta Pate.  . Personal history of colonic adenoma 10/30/2012   Diminutive adenoma, consider repeat 2019 per GI  . Presence of cardiac defibrillator 11/07/2017  . Primary osteoarthritis of both knees    Bone on bone of medial compartments, + signif patellofemoral arth bilat.--supartz inj series started 09/12/17  . PUD (peptic ulcer disease)   . PULMONARY HYPERTENSION, HX OF   . Secondary male hypogonadism 2017  . 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 Shortness of breath    with exertion: much improved s/p TAVR and treatment for CHF.  . Sick sinus syndrome (HCC)    PPM placed  . Thrombocytopenia ([place]) 2018   HSM on 2007 abd u/s---suspect some mild splenic sequestration chronically.  [name] Unspecified glaucoma(365.9)   . Unspecified hereditary and idiopathic peripheral neuropathy approx age 55   bilat LE's, ? left arm, too.  Feet became progressively numb + left foot pain intermittently.  Pt may be trying a spinal  stimulator (as of 05/2015)  . VENOUS INSUFFICIENCY    Being followed by Dr. Sharol Given as of 10/2016 for two R LL venous stasis ulcers/skin tears.  Healed as of 10/30/16 f/u with Dr. Sharol Given.  . VENTRAL HERNIA     Past Surgical History:  Procedure Laterality Date  . AMPUTATION Left 04/11/2013   Procedure: AMPUTATION DIGIT Left 3rd toe;  Surgeon: [name], MD;  Location: [facility];  Service: Orthopedics;  Laterality: Left;  Left 3rd toe amputation at [facility]  . CARDIAC CATHETERIZATION  1997; 03/10/16   1997 Non-obstructive disease.  03/2016 BMS to RCA, with 25% pDiag dz, o/w normal cors per cath 03/07/16.  Cath 11/01/16: in stent restenosis, successful baloon angioplasty.  [name] CARDIAC CATHETERIZATION  12/24/2012   mild < 20% LCx, prox 30% RCA; LVEF 55-65% , moderate pulmonary HTN, moderate AS  . CARDIAC  DEFIBRILLATOR PLACEMENT  11/07/2017   [facility] MRI Quad CRT defibrillator  . CARDIOVASCULAR STRESS TEST  05/11/16 ([facility])   Myocardial perfusion imaging:  No ischemia; scar in apex, global hypokinesis, EF 36%.  . Carotid dopplers  03/09/2016   [facility]: no hemodynamically significant stenosis on either side.  . CHOLECYSTECTOMY    . COLONOSCOPY    . COLONOSCOPY [name] 10/30/2012   Procedure: COLONOSCOPY;  Surgeon: [name], MD;  Location: [facility];  Service: Endoscopy;  Laterality: [name];  . CORONARY ANGIOPLASTY WITH STENT PLACEMENT  03/2016; 04/2017   2017-[facility]: BMS to RCA-pt was placed on Brilinta.  04/2017: DES to RCA.  [name] EYE SURGERY Bilateral cataract  . HEMORRHOID SURGERY    . INTRAOCULAR LENS INSERTION Bilateral   . KNEE SURGERY Right   . LEFT AND RIGHT HEART CATHETERIZATION WITH CORONARY ANGIOGRAM [name] 12/24/2012   Procedure: LEFT AND RIGHT HEART CATHETERIZATION WITH CORONARY ANGIOGRAM;  Surgeon: [name], MD;  Location: [facility];  Service: Cardiovascular;  Laterality: [name];  . LEG SURGERY Bilateral    lenghtening   . PACEMAKER PLACEMENT  04/13/2016   2nd deg HB after TAVR, pt had DC MDT PPM  placed.  [name] SHOULDER ARTHROSCOPY  08/30/2011   Procedure: ARTHROSCOPY SHOULDER;  Surgeon: [name], MD;  Location: [facility];  Service: Orthopedics;  Laterality: Right;  Right Shoulder Arthroscopy, Debridement, and Decompression  . SPINAL CORD STIMULATOR INSERTION [name] 09/10/2015   Procedure: LUMBAR SPINAL CORD STIMULATOR INSERTION;  Surgeon: [name], MD;  Location: [facility];  Service: Neurosurgery;  Laterality: [name];  . 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  R big toe surg due to osteoarth  . TONSILLECTOMY    . traeculectomy Left    eye  . TRANSCATHETER AORTIC VALVE REPLACEMENT, TRANSFEMORAL  04/11/2016  . TRANSESOPHAGEAL ECHOCARDIOGRAM  03/09/2016   [facility]: EF 55-60%, PFO seen with bi-directional shunting, no thrombus in appendage.  . TRANSTHORACIC ECHOCARDIOGRAM  01/2015; 01/2016; 05/18/16; 09/18/16, 05/2017, 08/2017   01/2015 No signif change in aortic stenosis (moderate).  01/2016 Severe LVH w/small LV cavity, EF 60-65%, grade I diast dysfxn.  05/2016 (s/p TAVR): EF 50-55%, grd I DD, biopros AV good.  08/2016--EF 50-55%, LV septal motion c/w conduction abnl, grd I DD,mild MS,bioprosth aortic valve well seated, w/trace AR. 05/2017 TTE EF 35%. 08/2017-EF 35%, mod diff hypokin LV, grd I DD, biopros AV good.   . TRANSTHORACIC ECHOCARDIOGRAM     04/2018: EF 40-45%, mod diffuse LV hypokinesis, grd I DD, bioprosth AV well seated, no AS or AR.  [name] VITRECTOMY     Of note: pt is NOT taking losartan listed below Outpatient Medications Prior to Visit  Medication Sig Dispense Refill  . albuterol (PROVENTIL HFA;VENTOLIN HFA) 108 (90 BASE) MCG/ACT inhaler Inhale 2 puffs into the lungs 4 (four) times daily as needed for wheezing or shortness of breath.    . allopurinol (ZYLOPRIM) 300 MG tablet TAKE 1 TABLET BY MOUTH ONCE DAILY WITH FOOD 90 tablet 1  . apixaban (ELIQUIS) 5 MG TABS tablet Take 1 tablet (5 mg total) by mouth 2 (two) times daily. 60 tablet 0  . B Complex-C (B-COMPLEX WITH VITAMIN C)  tablet Take 1 tablet by mouth daily. Take 1 tablet daily, 240m    . clotrimazole-betamethasone (LOTRISONE) cream apply to affected area twice a day 45 g 5  . ezetimibe (ZETIA) 10 MG tablet take 1 tablet by mouth once daily 90 tablet 3  . finasteride (PROSCAR) 5 MG  tablet Take 1 tablet (5 mg total) by mouth daily. 90 tablet 1  . fluticasone (FLONASE) 50 MCG/ACT nasal spray Place 2 sprays into both nostrils as needed for allergies or rhinitis.    . Fluticasone-Salmeterol (ADVAIR) 250-50 MCG/DOSE AEPB Inhale 1 puff into the lungs daily as needed (for nasal congestion).     . gabapentin (NEURONTIN) 800 MG tablet Take 1 tablet (800 mg total) by mouth 4 (four) times daily. 360 tablet 2  . isosorbide mononitrate (IMDUR) 60 MG 24 hr tablet TAKE 1 TABLET BY MOUTH EVERY DAY (Patient taking differently: 2 (two) times daily. ** DO NOT CRUSH **) 90 tablet 1  . KLOR-CON M20 20 MEQ tablet TAKE 1 TABLET EVERY DAY 90 tablet 1  . metoprolol succinate (TOPROL-XL) 25 MG 24 hr tablet Take 1 tablet by mouth daily. Along w/ 74m to equal 740mdaily    . metoprolol succinate (TOPROL-XL) 50 MG 24 hr tablet Take 50 mg by mouth daily. Take with or immediately following a meal.    . Multiple Vitamin (MULTIVITAMIN) capsule Take 1 capsule by mouth daily.    . niacin (NIASPAN) 1000 MG CR tablet Take 1 tablet (1,000 mg total) by mouth 2 (two) times daily. 180 tablet 1  . omeprazole (PRILOSEC) 10 MG capsule TAKE 1 CAPSULE BY MOUTH EVERY DAY 90 capsule 1  . rosuvastatin (CRESTOR) 40 MG tablet take 1 tablet by mouth once daily 15 tablet 0  . sacubitril-valsartan (ENTRESTO) 24-26 MG Take 1 tablet by mouth 2 (two) times daily.    . Marland KitchenITAMIN D, CHOLECALCIFEROL, PO Take 1 tablet by mouth daily.     . cyclobenzaprine (FLEXERIL) 5 MG tablet Take 0.5 tablet to 1 tablet at bedtime  for muscle spasm. (Patient not taking: Reported on 12/25/2018) 30 tablet 1  . losartan (COZAAR) 50 MG tablet     . mupirocin ointment (BACTROBAN) 2 % Apply 1  application topically 2 (two) times daily. Apply to the affected area 2 times a day (Patient not taking: Reported on 12/25/2018) 22 g 3  . naloxone (NARCAN) nasal spray 4 mg/0.1 mL Take for overdose of your oxycodone (Patient not taking: Reported on 12/25/2018) 1 kit 1  . nitroGLYCERIN (NITROSTAT) 0.4 MG SL tablet     . Oxycodone HCl 10 MG TABS Take 1 tablet (10 mg total) by mouth 3 (three) times daily as needed. May take an extra tablet when pain is severe 10 days out the month. (Patient not taking: Reported on 12/25/2018) 100 tablet 0  . torsemide (DEMADEX) 20 MG tablet 20 mg daily as needed    . traMADol-acetaminophen (ULTRACET) 37.5-325 MG tablet Take 1 tablet by mouth 3 (three) times daily as needed. (Patient not taking: Reported on 12/25/2018) 90 tablet 3   No facility-administered medications prior to visit.     Allergies  Allergen Reactions  . Brimonidine Tartrate Shortness Of Breath    Alphagan-Shortness of breath  . Brimonidine Tartrate Palpitations  . Brinzolamide Shortness Of Breath    AZOPT- Shortness of breath  . Latanoprost Shortness Of Breath    XALATAN- Shortness of breath  . Nucynta [Tapentadol] Shortness Of Breath  . Sulfa Antibiotics Palpitations  . Timolol Maleate Shortness Of Breath and Other (See Comments)    TIMOPTIC- Aggravated asthma  . Cardizem  [Diltiazem Hcl] Swelling  . Diltiazem Swelling     leg swelling  . Rofecoxib Swelling     VIOXX- leg swelling  . Vancomycin Hives and Other (See Comments)    Possible "  Red Man Syndrome"? > hives/blisters  . Codeine Other (See Comments)    Childhood reaction  . Celecoxib Other (See Comments)    CELLBREX-confusion  . Colchicine Diarrhea    diarrhea  . Tape Rash    ROS As per HPI  PE: Blood pressure 104/69, pulse 75, temperature (!) 97.3 F (36.3 C), temperature source Temporal, resp. rate 14, height 6' (1.829 m), weight (!) 338 lb 3.2 oz (153.4 kg), SpO2 97 %. Gen: Alert, well appearing.  Patient is oriented  to person, place, time, and situation. AFFECT: pleasant, lucid thought and speech. SVX:BLTJ: no injection, icteris, swelling, or exudate.  EOMI, PERRLA. Mouth: lips without lesion/swelling.  Oral mucosa pink and moist. Oropharynx without erythema, exudate, or swelling.  CV: RRR, no m/r/g.   LUNGS: CTA bilat, nonlabored resps, good aeration in all lung fields. ABD: soft, NT EXT: 2+ pitting edema L LL and 3+ pitting edema R LL.  Skin intact. GU: redundant adipose tissue such that his penis was completely retracted and I could not palpate it or view it. LABS:  Lab Results  Component Value Date   TSH 0.96 09/16/2018     Chemistry      Component Value Date/Time   NA 144 09/16/2018 1607   NA 145 04/28/2017   K 3.9 09/16/2018 1607   CL 105 09/16/2018 1607   CO2 29 09/16/2018 1607   BUN 16 09/16/2018 1607   BUN 17 04/28/2017 0515   CREATININE 0.86 09/16/2018 1607   CREATININE 0.71 02/09/2016 1333   GLU 130 04/28/2017 0515      Component Value Date/Time   CALCIUM 9.3 09/16/2018 1607   ALKPHOS 84 06/06/2018 1459   AST 19 06/06/2018 1459   ALT 14 06/06/2018 1459   BILITOT 0.5 06/06/2018 1459     Lab Results  Component Value Date   HGBA1C 6.1 09/16/2018   Lab Results  Component Value Date   CHOL 108 08/09/2017   HDL 56.30 08/09/2017   LDLCALC 39 08/09/2017   TRIG 63.0 08/09/2017   CHOLHDL 2 08/09/2017   Lab Results  Component Value Date   WBC 10.0 09/16/2018   HGB 12.9 (L) 09/16/2018   HCT 38.3 (L) 09/16/2018   MCV 92.7 09/16/2018   PLT 126.0 (L) 09/16/2018   Dipstick CC UA today: 2+ leukocytes, otherwise normal  IMPRESSION AND PLAN:  1) UTI: mostly tip of the penis burning. Send urine for c/s. Start cefdinir 359m bid x 7d. No signif symptoms of BPH to suggest that obstructive sx's are causing increased risk of recurrent UTI.  2) Chronic combined systolic and diastolic HF: his wt suggests volume overload.  Fortunately he doesn't feel like he is retaining fluid.   Some of his wt gain lately has been nutritional/caloric. I recommended he take torsemide when he gets home today and again tomorrow. BMET today.  3) HLD: tolerating statin. FLP today.  4) DM 2, diet controlled.  Last a1c 3 mo ago 6.1%.  Plan repeat a1c next f/u in 3 mo.  5) Chronic pain syndrome: he has taken himself off of opioids b/c his back problem is "not that bad" anymore. He'll remain on gabapentin for his neuropathic pain, and I'll take over rx'ing responsibilities for this med.  An After Visit Summary was printed and given to the patient.  FOLLOW UP: Return in about 3 months (around 03/27/2019) for routine chronic illness f/u.  Signed:  PCrissie Sickles MD           12/25/2018

## 2018-12-25 NOTE — Patient Instructions (Signed)
Take 63m torsemide today and tomorrow evening as well.  I sent cefdinir antibiotic to your pharmacy.

## 2018-12-26 LAB — URINE CULTURE
MICRO NUMBER:: 604064
Result:: NO GROWTH
SPECIMEN QUALITY:: ADEQUATE

## 2018-12-30 ENCOUNTER — Telehealth: Payer: Self-pay | Admitting: Family Medicine

## 2018-12-30 NOTE — Telephone Encounter (Signed)
Patient forgot to mention during his last appt that he needs his A1C checked. Please enter order. Thanks

## 2018-12-30 NOTE — Telephone Encounter (Signed)
LM for pt to returncall

## 2019-01-01 NOTE — Telephone Encounter (Signed)
Pt was advised A1c will be checked at next follow up visit in September.

## 2019-01-10 ENCOUNTER — Other Ambulatory Visit: Payer: Self-pay

## 2019-01-10 MED ORDER — ISOSORBIDE MONONITRATE ER 60 MG PO TB24: 60.0000 mg | ORAL_TABLET | Freq: Every day | ORAL | 1 refills | Status: DC

## 2019-02-03 ENCOUNTER — Other Ambulatory Visit: Payer: Self-pay | Admitting: Family Medicine

## 2019-03-27 ENCOUNTER — Ambulatory Visit: Payer: Medicare Other | Admitting: Family Medicine

## 2019-03-27 ENCOUNTER — Encounter: Payer: Self-pay | Admitting: Family Medicine

## 2019-03-27 ENCOUNTER — Other Ambulatory Visit: Payer: Self-pay

## 2019-03-27 VITALS — BP 126/80 | HR 70 | Temp 97.9°F | Resp 16 | Ht 72.0 in | Wt 340.5 lb

## 2019-03-27 DIAGNOSIS — I480 Paroxysmal atrial fibrillation: Secondary | ICD-10-CM

## 2019-03-27 DIAGNOSIS — I5042 Chronic combined systolic (congestive) and diastolic (congestive) heart failure: Secondary | ICD-10-CM

## 2019-03-27 DIAGNOSIS — I10 Essential (primary) hypertension: Secondary | ICD-10-CM | POA: Diagnosis not present

## 2019-03-27 DIAGNOSIS — D649 Anemia, unspecified: Secondary | ICD-10-CM | POA: Diagnosis not present

## 2019-03-27 DIAGNOSIS — Z7901 Long term (current) use of anticoagulants: Secondary | ICD-10-CM | POA: Diagnosis not present

## 2019-03-27 DIAGNOSIS — E118 Type 2 diabetes mellitus with unspecified complications: Secondary | ICD-10-CM | POA: Diagnosis not present

## 2019-03-27 DIAGNOSIS — Z23 Encounter for immunization: Secondary | ICD-10-CM | POA: Diagnosis not present

## 2019-03-27 DIAGNOSIS — E782 Mixed hyperlipidemia: Secondary | ICD-10-CM

## 2019-03-27 DIAGNOSIS — D696 Thrombocytopenia, unspecified: Secondary | ICD-10-CM

## 2019-03-27 NOTE — Progress Notes (Signed)
OFFICE NOTE  03/27/2019  CC:  Chief Complaint  Patient presents with  . Follow-up    RCI    HPI:   Patient is a 78 y.o. Caucasian male who is here for 3 mo f/u DM 2, HLD, and peripheral edema.  Of note, he has an extensive cardiac history, for which he is followed by [facility] (Dr. Radford Pax) [place]-->CAD w/stents, PAF/, chronic combined systolic and diastolic HF, and hx of severe aortic stenosis (now s/p TAVR). He was recently upgraded to a biventricular ICD for primary prevention.   Also has OHS, OSA, and COPD, for which he is followed by [facility] [place].  He has had a longterm history of morbid obesity, chronic pain syndrome(has pain mgmt MD), orthostatic hypotension/dizziness, and peripheral edema.  Interim hx:  DM: not checking glucoses. His feet are completely numb, wife checks feet regularly.  NO footware in house. HTN: home bps normal at home.  HLD: rosuvastatin, zetia, niacin  LE edema:  Has had to take torsemide on occasion for increased LE swelling and Increased wt: 2 days of taking it then weeks in between. Has custom made compression wrap-around stockings on both legs today.  Unable to examine LL's or feet today.  ROS: no CP, no SOB, no wheezing, no cough, no dizziness, no HAs, no rashes, no melena/hematochezia.  No polyuria or polydipsia.  No myalgias or arthralgias.   Past Medical History:  Diagnosis Date  . ASTHMA   . BPH (benign prostatic hypertrophy)   . CAD (coronary artery disease)    Nonobstructive by 12/2012 cath; then 03/2016 he required BMS to RCA ([facility]).  In-stent restenosis on cath 11/01/16, baloon angioplasty successful--pt to take plavix (no ASA), + eliquis (for PAF).  [name] CATARACT, HX OF   . Chronic combined systolic and diastolic CHF (congestive heart failure) ([place]) 05/2016   Ischemic CM.  04/2018 EF 40-45%, grd I DD.  [name] Chronic pain syndrome    Lumbar DDD; chronic neuropathic pain (DM); has spinal stimulator and sees  pain mgmt MD  . COPD   . Diabetes mellitus type 2 with complications (HCC)    XTK2I jumped from 5.7% to 6.1% 03/2015---started metformin at that time.  DM 2 dx by fasting gluc criteria 2018.  Has chronic neuropath pain  . Elevated transaminase level 02/2016   Suspect due to fatty liver (documented on u/s 2007). Plan repeat labs 03/2016.  [name] Essential hypertension, benign   . Fatty liver 2007   2007 u/s showed fatty liver with hepatosplenomegaly.  2019 repeat u/s->fatty liver but no cirrhosis or hepatosplenomegaly.  [name] GERD (gastroesophageal reflux disease)   . Glaucoma   . GOUT   . Hallux valgus (acquired)   . HH (hiatus hernia)   . HYPERCHOLESTEROLEMIA-PURE   . Hypogonadism male   . Lumbosacral neuritis   . Lumbosacral spondylosis    Lumbar spinal stenosis with neurogenic claudication--contributes to his chronic pain syndrome  . Normal memory function 08/2014   Neuropsychological testing ([facility]): no cognitive impairment or sign of neurodegenerative disorder.  Likely has adjustment d/o with mixed anxiety/depressed features and may benefit from low dose SNRI.    [name] Normocytic anemia 03/2016   Mild-pt needs ferritin and vit B12 level checked (as of 03/22/16)  . NSTEMI (non-ST elevated myocardial infarction) ([place]) 03/20/2016   BMS to RCA  . Obesity hypoventilation syndrome ([place])   . Obesity, unspecified   . Orthostatic hypotension   . OSA on CPAP    8 cm  H2O  . OSTEOARTHRITIS   . Paroxysmal atrial fibrillation ([place]) 2003    (? chronic?) Off anticoag for a while due to falls.  Then apixaban started 12/2014.  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 Ella Bodo): neuropathic pain->diff to treat, failed nucynta, failed spinal stimul trial, oxycontin hs + tramadol + gabap as of 12/2017 f/u Dr. Letta Pate.  . Personal history of colonic adenoma 10/30/2012   Diminutive adenoma, consider repeat 2019 per GI  . Presence of cardiac defibrillator  11/07/2017  . Primary osteoarthritis of both knees    Bone on bone of medial compartments, + signif patellofemoral arth bilat.--supartz inj series started 09/12/17  . PUD (peptic ulcer disease)   . PULMONARY HYPERTENSION, HX OF   . Secondary male hypogonadism 2017  . 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 Shortness of breath    with exertion: much improved s/p TAVR and treatment for CHF.  . Sick sinus syndrome (HCC)    PPM placed  . Thrombocytopenia ([place]) 2018   HSM on 2007 abd u/s---suspect some mild splenic sequestration chronically.  [name] Unspecified glaucoma(365.9)   . Unspecified hereditary and idiopathic peripheral neuropathy approx age 38   bilat LE's, ? left arm, too.  Feet became progressively numb + left foot pain intermittently.  Pt may be trying a spinal stimulator (as of 05/2015)  . VENOUS INSUFFICIENCY    Being followed by Dr. Sharol Given as of 10/2016 for two R LL venous stasis ulcers/skin tears.  Healed as of 10/30/16 f/u with Dr. Sharol Given.  . VENTRAL HERNIA     Past Surgical History:  Procedure Laterality Date  . AMPUTATION Left 04/11/2013   Procedure: AMPUTATION DIGIT Left 3rd toe;  Surgeon: [name], MD;  Location: [facility];  Service: Orthopedics;  Laterality: Left;  Left 3rd toe amputation at [facility]  . CARDIAC CATHETERIZATION  1997; 03/10/16   1997 Non-obstructive disease.  03/2016 BMS to RCA, with 25% pDiag dz, o/w normal cors per cath 03/07/16.  Cath 11/01/16: in stent restenosis, successful baloon angioplasty.  [name] CARDIAC CATHETERIZATION  12/24/2012   mild < 20% LCx, prox 30% RCA; LVEF 55-65% , moderate pulmonary HTN, moderate AS  . CARDIAC DEFIBRILLATOR PLACEMENT  11/07/2017   [facility] MRI Quad CRT defibrillator  . CARDIOVASCULAR STRESS TEST  05/11/16 ([facility])   Myocardial perfusion imaging:  No ischemia; scar in apex, global hypokinesis, EF 36%.  . Carotid dopplers  03/09/2016   [facility]: no hemodynamically significant  stenosis on either side.  . CHOLECYSTECTOMY    . COLONOSCOPY    . COLONOSCOPY [name] 10/30/2012   Procedure: COLONOSCOPY;  Surgeon: [name], MD;  Location: [facility];  Service: Endoscopy;  Laterality: [name];  . CORONARY ANGIOPLASTY WITH STENT PLACEMENT  03/2016; 04/2017   2017-[facility]: BMS to RCA-pt was placed on Brilinta.  04/2017: DES to RCA.  [name] EYE SURGERY Bilateral cataract  . HEMORRHOID SURGERY    . INTRAOCULAR LENS INSERTION Bilateral   . KNEE SURGERY Right   . LEFT AND RIGHT HEART CATHETERIZATION WITH CORONARY ANGIOGRAM [name] 12/24/2012   Procedure: LEFT AND RIGHT HEART CATHETERIZATION WITH CORONARY ANGIOGRAM;  Surgeon: [name], MD;  Location: [facility];  Service: Cardiovascular;  Laterality: [name];  . LEG SURGERY Bilateral    lenghtening   . PACEMAKER PLACEMENT  04/13/2016   2nd deg HB after TAVR, pt had DC MDT PPM placed.  [name]  SHOULDER ARTHROSCOPY  08/30/2011   Procedure: ARTHROSCOPY SHOULDER;  Surgeon: [name], MD;  Location: [facility];  Service: Orthopedics;  Laterality: Right;  Right Shoulder Arthroscopy, Debridement, and Decompression  . SPINAL CORD STIMULATOR INSERTION [name] 09/10/2015   Procedure: LUMBAR SPINAL CORD STIMULATOR INSERTION;  Surgeon: [name], MD;  Location: [facility];  Service: Neurosurgery;  Laterality: [name];  . 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  R big toe surg due to osteoarth  . TONSILLECTOMY    . traeculectomy Left    eye  . TRANSCATHETER AORTIC VALVE REPLACEMENT, TRANSFEMORAL  04/11/2016  . TRANSESOPHAGEAL ECHOCARDIOGRAM  03/09/2016   [facility]: EF 55-60%, PFO seen with bi-directional shunting, no thrombus in appendage.  . TRANSTHORACIC ECHOCARDIOGRAM  01/2015; 01/2016; 05/18/16; 09/18/16, 05/2017, 08/2017   01/2015 No signif change in aortic stenosis (moderate).  01/2016 Severe LVH w/small LV cavity, EF 60-65%, grade I diast dysfxn.  05/2016 (s/p TAVR): EF 50-55%, grd I DD, biopros AV good.  08/2016--EF 50-55%, LV septal motion c/w  conduction abnl, grd I DD,mild MS,bioprosth aortic valve well seated, w/trace AR. 05/2017 TTE EF 35%. 08/2017-EF 35%, mod diff hypokin LV, grd I DD, biopros AV good.   . TRANSTHORACIC ECHOCARDIOGRAM     04/2018: EF 40-45%, mod diffuse LV hypokinesis, grd I DD, bioprosth AV well seated, no AS or AR.  [name] VITRECTOMY      MEDS;   Outpatient Medications Prior to Visit  Medication Sig Dispense Refill  . albuterol (PROVENTIL HFA;VENTOLIN HFA) 108 (90 BASE) MCG/ACT inhaler Inhale 2 puffs into the lungs 4 (four) times daily as needed for wheezing or shortness of breath.    . allopurinol (ZYLOPRIM) 300 MG tablet TAKE 1 TABLET BY MOUTH ONCE DAILY WITH FOOD 90 tablet 1  . apixaban (ELIQUIS) 5 MG TABS tablet Take 1 tablet (5 mg total) by mouth 2 (two) times daily. 60 tablet 0  . ASPIRIN LOW DOSE 81 MG EC tablet Take 81 mg by mouth daily.    . B Complex-C (B-COMPLEX WITH VITAMIN C) tablet Take 1 tablet by mouth daily. Take 1 tablet daily, 217m    . ezetimibe (ZETIA) 10 MG tablet take 1 tablet by mouth once daily 90 tablet 3  . finasteride (PROSCAR) 5 MG tablet TAKE 1 TABLET BY MOUTH EVERY DAY 90 tablet 0  . fluticasone (FLONASE) 50 MCG/ACT nasal spray Place 2 sprays into both nostrils as needed for allergies or rhinitis.    . Fluticasone-Salmeterol (ADVAIR DISKUS) 250-50 MCG/DOSE AEPB TAKE 1 PUFF BY MOUTH TWICE A DAY    . Fluticasone-Salmeterol (ADVAIR) 250-50 MCG/DOSE AEPB Inhale 1 puff into the lungs daily as needed (for nasal congestion).     . gabapentin (NEURONTIN) 800 MG tablet Take 1 tablet (800 mg total) by mouth 4 (four) times daily. 360 tablet 2  . isosorbide mononitrate (IMDUR) 60 MG 24 hr tablet Take 1 tablet (60 mg total) by mouth daily. 90 tablet 1  . KLOR-CON M20 20 MEQ tablet TAKE 1 TABLET BY MOUTH EVERY DAY 90 tablet 0  . metoprolol succinate (TOPROL-XL) 25 MG 24 hr tablet Take 1 tablet by mouth daily. Along w/ 536mto equal 7518maily    . metoprolol succinate (TOPROL-XL) 50 MG 24 hr tablet  Take 50 mg by mouth daily. Take with or immediately following a meal.    . Multiple Vitamin (MULTIVITAMIN) capsule Take 1 capsule by mouth daily.    . niacin (NIASPAN) 1000 MG CR tablet Take 1 tablet (  1,000 mg total) by mouth 2 (two) times daily. 180 tablet 1  . omeprazole (PRILOSEC) 10 MG capsule TAKE 1 CAPSULE BY MOUTH EVERY DAY 90 capsule 0  . rosuvastatin (CRESTOR) 40 MG tablet take 1 tablet by mouth once daily 15 tablet 0  . sacubitril-valsartan (ENTRESTO) 24-26 MG Take 1 tablet by mouth 2 (two) times daily.    [name] torsemide (DEMADEX) 20 MG tablet 20 mg daily as needed    . VITAMIN D, CHOLECALCIFEROL, PO Take 1 tablet by mouth daily.     . clotrimazole-betamethasone (LOTRISONE) cream apply to affected area twice a day (Patient not taking: Reported on 03/27/2019) 45 g 5  . cyclobenzaprine (FLEXERIL) 5 MG tablet Take 0.5 tablet to 1 tablet at bedtime  for muscle spasm. (Patient not taking: Reported on 12/25/2018) 30 tablet 1  . mupirocin ointment (BACTROBAN) 2 % Apply 1 application topically 2 (two) times daily. Apply to the affected area 2 times a day (Patient not taking: Reported on 12/25/2018) 22 g 3  . nitroGLYCERIN (NITROSTAT) 0.4 MG SL tablet      No facility-administered medications prior to visit.     PE: Blood pressure 126/80, pulse 70, temperature 97.9 F (36.6 C), temperature source Temporal, resp. rate 16, height 6' (1.829 m), weight (!) 340 lb 8 oz (154.4 kg), SpO2 97 %. Gen: Alert, well appearing, morbidly obese.  Uses a rolling walker with a seat..  Patient is oriented to person, place, time, and situation. AFFECT: pleasant but fairly blunt facial expression as per his usual., lucid thought and speech. CV: RRR, rate about 60, 1-2/6 syst murmur w/out rub/gallop.  Chest is clear, no wheezing or rales. Normal symmetric air entry throughout both lung fields. No chest wall deformities or tenderness. ABD: soft, NT, rotund but non-distented.   EXT: wrapped with compression stockings so  could not be examined. SkIN: no pallor, rash, or jaundice   LABS:  Lab Results  Component Value Date   VITAMINB12 429 05/24/2016    Lab Results  Component Value Date   TSH 0.96 09/16/2018   Lab Results  Component Value Date   WBC 10.0 09/16/2018   HGB 12.9 (L) 09/16/2018   HCT 38.3 (L) 09/16/2018   MCV 92.7 09/16/2018   PLT 126.0 (L) 09/16/2018   Lab Results  Component Value Date   IRON 59 05/24/2016   IRON 54 05/24/2016   TIBC 334 05/24/2016   FERRITIN 44.8 05/24/2016    Lab Results  Component Value Date   CREATININE 0.75 12/25/2018   BUN 16 12/25/2018   NA 140 12/25/2018   K 4.3 12/25/2018   CL 102 12/25/2018   CO2 29 12/25/2018   Lab Results  Component Value Date   ALT 14 06/06/2018   AST 19 06/06/2018   ALKPHOS 84 06/06/2018   BILITOT 0.5 06/06/2018   Lab Results  Component Value Date   CHOL 107 12/25/2018   Lab Results  Component Value Date   HDL 62.70 12/25/2018   Lab Results  Component Value Date   LDLCALC 29 12/25/2018   Lab Results  Component Value Date   TRIG 72.0 12/25/2018   Lab Results  Component Value Date   CHOLHDL 2 12/25/2018   Lab Results  Component Value Date   PSA 0.23 11/23/2015   PSA 0.33 10/07/2014   PSA 0.40 09/25/2013   Lab Results  Component Value Date   HGBA1C 6.1 09/16/2018   IMPRESSION AND PLAN:  1) DM 2, diet controlled. Due for A1c  today.  Lytes/cr today. Feet exam due but could not do this today b/c LL's and feet extensively wrapped with compression stockings. Flu shot today.  2) HTN: The current medical regimen is effective;  continue present plan and medications. BMET today.  3) HLD: tolerating statin, zetia, and niacin. FLP 3 mo ago EXCELLENT. Hepatic panel today. Plan recheck FLP at next f/u for CPE in 4 mo.  4) Chronic LE edema: combo of venous insufficiency, chronic combined syst/diast HF, and OHS. Could not examine this today but he is pleased with his compression wraps. Only  occasionally has to take torsemide.  5) PAF, has biventricular pacer/ICD-->sounds paced today.  He is on xarelto (as well as ASA for his CAD): also with mild hx of normocytic anemia and mild thrombocytopenia that is likely related to some splenic sequestration. Check CBC today.  An After Visit Summary was printed and given to the patient.  FOLLOW UP:  No follow-ups on file.  Signed:  Crissie Sickles, MD           03/27/2019

## 2019-03-28 LAB — COMPREHENSIVE METABOLIC PANEL
ALT: 15 U/L (ref 0–53)
AST: 20 U/L (ref 0–37)
Albumin: 3.6 g/dL (ref 3.5–5.2)
Alkaline Phosphatase: 61 U/L (ref 39–117)
BUN: 12 mg/dL (ref 6–23)
CO2: 26 mEq/L (ref 19–32)
Calcium: 9 mg/dL (ref 8.4–10.5)
Chloride: 105 mEq/L (ref 96–112)
Creatinine, Ser: 0.67 mg/dL (ref 0.40–1.50)
GFR: 114.33 mL/min (ref >60.00)
Glucose, Bld: 111 mg/dL — ABNORMAL HIGH (ref 70–99)
Potassium: 3.8 mEq/L (ref 3.5–5.1)
Sodium: 141 mEq/L (ref 135–145)
Total Bilirubin: 0.6 mg/dL (ref 0.2–1.2)
Total Protein: 5.7 g/dL — ABNORMAL LOW (ref 6.0–8.3)

## 2019-03-28 LAB — CBC
HCT: 35.4 % — ABNORMAL LOW (ref 39.0–52.0)
Hemoglobin: 12 g/dL — ABNORMAL LOW (ref 13.0–17.0)
MCHC: 33.8 g/dL (ref 30.0–36.0)
MCV: 89.6 fl (ref 78.0–100.0)
Platelets: 118 10*3/uL — ABNORMAL LOW (ref 150.0–400.0)
RBC: 3.95 Mil/uL — ABNORMAL LOW (ref 4.22–5.81)
RDW: 14.5 % (ref 11.5–15.5)
WBC: 7.8 10*3/uL (ref 4.0–10.5)

## 2019-03-28 LAB — HEMOGLOBIN A1C: Hgb A1c MFr Bld: 6.1 % (ref 4.6–6.5)

## 2019-03-31 ENCOUNTER — Telehealth: Payer: Self-pay | Admitting: Family Medicine

## 2019-03-31 NOTE — Telephone Encounter (Signed)
A user error has taken place: encounter opened in error, closed for administrative reasons.

## 2019-04-21 ENCOUNTER — Other Ambulatory Visit: Payer: Self-pay | Admitting: Family Medicine

## 2019-04-21 ENCOUNTER — Other Ambulatory Visit: Payer: Self-pay | Admitting: Registered Nurse

## 2019-04-21 NOTE — Telephone Encounter (Signed)
RF request for Niacin LOV:03/27/19 Next ov: advised to f/u 4 mo. Fasting CPE Last written: 10/31/18 (180,1)  Please advise, thanks.

## 2019-05-05 ENCOUNTER — Other Ambulatory Visit: Payer: Self-pay | Admitting: Family Medicine

## 2019-05-06 ENCOUNTER — Other Ambulatory Visit: Payer: Self-pay | Admitting: Family Medicine

## 2019-05-28 ENCOUNTER — Other Ambulatory Visit: Payer: Self-pay

## 2019-05-28 DIAGNOSIS — Z20822 Contact with and (suspected) exposure to covid-19: Secondary | ICD-10-CM

## 2019-05-30 LAB — NOVEL CORONAVIRUS, NAA: SARS-CoV-2, NAA: NOT DETECTED

## 2019-06-12 ENCOUNTER — Other Ambulatory Visit: Payer: Self-pay | Admitting: Family Medicine

## 2019-07-09 MED ORDER — ISOSORBIDE MONONITRATE ER 30 MG PO TB24
60.00 | ORAL_TABLET | ORAL | Status: DC
Start: 2019-07-10 — End: 2019-07-09

## 2019-07-09 MED ORDER — ALLOPURINOL 300 MG PO TABS
300.00 | ORAL_TABLET | ORAL | Status: DC
Start: 2019-07-09 — End: 2019-07-09

## 2019-07-09 MED ORDER — FLUTICASONE PROPIONATE 50 MCG/ACT NA SUSP
2.00 | NASAL | Status: DC
Start: ? — End: 2019-07-09

## 2019-07-09 MED ORDER — PHENAZOPYRIDINE HCL 100 MG PO TABS
100.00 | ORAL_TABLET | ORAL | Status: DC
Start: ? — End: 2019-07-09

## 2019-07-09 MED ORDER — ROSUVASTATIN CALCIUM 40 MG PO TABS
40.00 | ORAL_TABLET | ORAL | Status: DC
Start: 2019-07-10 — End: 2019-07-09

## 2019-07-09 MED ORDER — TRAMADOL HCL 50 MG PO TABS
50.00 | ORAL_TABLET | ORAL | Status: DC
Start: ? — End: 2019-07-09

## 2019-07-09 MED ORDER — EZETIMIBE 10 MG PO TABS
10.00 | ORAL_TABLET | ORAL | Status: DC
Start: 2019-07-09 — End: 2019-07-09

## 2019-07-09 MED ORDER — PANTOPRAZOLE SODIUM 20 MG PO TBEC
20.00 | DELAYED_RELEASE_TABLET | ORAL | Status: DC
Start: 2019-07-09 — End: 2019-07-09

## 2019-07-09 MED ORDER — NIACIN ER (ANTIHYPERLIPIDEMIC) 500 MG PO TBCR
500.00 | EXTENDED_RELEASE_TABLET | ORAL | Status: DC
Start: 2019-07-09 — End: 2019-07-09

## 2019-07-09 MED ORDER — ONDANSETRON HCL 4 MG/2ML IJ SOLN
4.00 | INTRAMUSCULAR | Status: DC
Start: ? — End: 2019-07-09

## 2019-07-09 MED ORDER — FINASTERIDE 5 MG PO TABS
5.00 | ORAL_TABLET | ORAL | Status: DC
Start: 2019-07-10 — End: 2019-07-09

## 2019-07-09 MED ORDER — GABAPENTIN 400 MG PO CAPS
800.00 | ORAL_CAPSULE | ORAL | Status: DC
Start: 2019-07-09 — End: 2019-07-09

## 2019-07-09 MED ORDER — CLOTRIMAZOLE 1 % EX CREA
TOPICAL_CREAM | CUTANEOUS | Status: DC
Start: ? — End: 2019-07-09

## 2019-07-09 MED ORDER — GENERIC EXTERNAL MEDICATION
2.00 | Status: DC
Start: 2019-07-10 — End: 2019-07-09

## 2019-07-09 MED ORDER — APIXABAN 5 MG PO TABS
5.00 | ORAL_TABLET | ORAL | Status: DC
Start: 2019-07-09 — End: 2019-07-09

## 2019-07-09 MED ORDER — ACETAMINOPHEN 325 MG PO TABS
650.00 | ORAL_TABLET | ORAL | Status: DC
Start: ? — End: 2019-07-09

## 2019-07-09 MED ORDER — GENERIC EXTERNAL MEDICATION
75.00 | Status: DC
Start: 2019-07-09 — End: 2019-07-09

## 2019-07-09 MED ORDER — GENERIC EXTERNAL MEDICATION
Status: DC
Start: ? — End: 2019-07-09

## 2019-07-09 MED ORDER — SACUBITRIL-VALSARTAN 24-26 MG PO TABS
1.00 | ORAL_TABLET | ORAL | Status: DC
Start: 2019-07-09 — End: 2019-07-09

## 2019-07-09 MED ORDER — ASPIRIN 81 MG PO TBEC
81.00 | DELAYED_RELEASE_TABLET | ORAL | Status: DC
Start: 2019-07-09 — End: 2019-07-09

## 2019-07-09 MED ORDER — CLOTRIMAZOLE-BETAMETHASONE 1-0.05 % EX CREA
1.00 | TOPICAL_CREAM | CUTANEOUS | Status: DC
Start: ? — End: 2019-07-09

## 2019-07-09 MED ORDER — NITROGLYCERIN 0.4 MG SL SUBL
0.40 | SUBLINGUAL_TABLET | SUBLINGUAL | Status: DC
Start: ? — End: 2019-07-09

## 2019-07-09 MED ORDER — ALPRAZOLAM 0.25 MG PO TABS
0.25 | ORAL_TABLET | ORAL | Status: DC
Start: ? — End: 2019-07-09

## 2019-07-09 MED ORDER — MORPHINE SULFATE (PF) 2 MG/ML IV SOLN
2.00 | INTRAVENOUS | Status: DC
Start: ? — End: 2019-07-09

## 2019-07-09 MED ORDER — POTASSIUM CHLORIDE CRYS ER 20 MEQ PO TBCR
20.00 | EXTENDED_RELEASE_TABLET | ORAL | Status: DC
Start: 2019-07-10 — End: 2019-07-09

## 2019-07-09 MED ORDER — BUDESONIDE-FORMOTEROL FUMARATE 80-4.5 MCG/ACT IN AERO
2.00 | INHALATION_SPRAY | RESPIRATORY_TRACT | Status: DC
Start: 2019-07-09 — End: 2019-07-09

## 2019-07-13 ENCOUNTER — Other Ambulatory Visit: Payer: Self-pay | Admitting: Family Medicine

## 2019-08-13 ENCOUNTER — Other Ambulatory Visit: Payer: Self-pay

## 2019-08-13 ENCOUNTER — Emergency Department (HOSPITAL_COMMUNITY)
Admission: EM | Admit: 2019-08-13 | Discharge: 2019-08-14 | Disposition: A | Discharge status: Home / Self Care | Payer: Medicare Other | Attending: Emergency Medicine | Admitting: Emergency Medicine

## 2019-08-13 ENCOUNTER — Encounter (HOSPITAL_COMMUNITY): Payer: Self-pay | Admitting: *Deleted

## 2019-08-13 ENCOUNTER — Emergency Department (HOSPITAL_COMMUNITY): Payer: Medicare Other

## 2019-08-13 DIAGNOSIS — J45909 Unspecified asthma, uncomplicated: Secondary | ICD-10-CM | POA: Insufficient documentation

## 2019-08-13 DIAGNOSIS — L97421 Non-pressure chronic ulcer of left heel and midfoot limited to breakdown of skin: Secondary | ICD-10-CM | POA: Insufficient documentation

## 2019-08-13 DIAGNOSIS — Z79899 Other long term (current) drug therapy: Secondary | ICD-10-CM | POA: Diagnosis not present

## 2019-08-13 DIAGNOSIS — E11621 Type 2 diabetes mellitus with foot ulcer: Secondary | ICD-10-CM | POA: Diagnosis not present

## 2019-08-13 DIAGNOSIS — E13621 Other specified diabetes mellitus with foot ulcer: Secondary | ICD-10-CM

## 2019-08-13 DIAGNOSIS — J449 Chronic obstructive pulmonary disease, unspecified: Secondary | ICD-10-CM | POA: Diagnosis not present

## 2019-08-13 DIAGNOSIS — Z7901 Long term (current) use of anticoagulants: Secondary | ICD-10-CM | POA: Insufficient documentation

## 2019-08-13 DIAGNOSIS — L988 Other specified disorders of the skin and subcutaneous tissue: Secondary | ICD-10-CM | POA: Diagnosis present

## 2019-08-13 LAB — CBC WITH DIFFERENTIAL/PLATELET
Abs Immature Granulocytes: 0.03 10*3/uL (ref 0.00–0.07)
Basophils Absolute: 0.1 10*3/uL (ref 0.0–0.1)
Basophils Relative: 1 %
Eosinophils Absolute: 0.1 10*3/uL (ref 0.0–0.5)
Eosinophils Relative: 1 %
HCT: 39.3 % (ref 39.0–52.0)
Hemoglobin: 13 g/dL (ref 13.0–17.0)
Immature Granulocytes: 0 %
Lymphocytes Relative: 19 %
Lymphs Abs: 1.5 10*3/uL (ref 0.7–4.0)
MCH: 30.1 pg (ref 26.0–34.0)
MCHC: 33.1 g/dL (ref 30.0–36.0)
MCV: 91 fL (ref 80.0–100.0)
Monocytes Absolute: 1 10*3/uL (ref 0.1–1.0)
Monocytes Relative: 13 %
Neutro Abs: 5.1 10*3/uL (ref 1.7–7.7)
Neutrophils Relative %: 66 %
Platelets: 170 10*3/uL (ref 150–400)
RBC: 4.32 MIL/uL (ref 4.22–5.81)
RDW: 13.5 % (ref 11.5–15.5)
WBC: 7.8 10*3/uL (ref 4.0–10.5)
nRBC: 0 % (ref 0.0–0.2)

## 2019-08-13 LAB — COMPREHENSIVE METABOLIC PANEL
ALT: 20 U/L (ref 0–44)
AST: 25 U/L (ref 15–41)
Albumin: 4 g/dL (ref 3.5–5.0)
Alkaline Phosphatase: 75 U/L (ref 38–126)
Anion gap: 12 (ref 5–15)
BUN: 22 mg/dL (ref 8–23)
CO2: 22 mmol/L (ref 22–32)
Calcium: 9.3 mg/dL (ref 8.9–10.3)
Chloride: 104 mmol/L (ref 98–111)
Creatinine, Ser: 0.76 mg/dL (ref 0.61–1.24)
GFR calc Af Amer: >60 mL/min (ref >60)
GFR calc non Af Amer: >60 mL/min (ref >60)
Glucose, Bld: 142 mg/dL — ABNORMAL HIGH (ref 70–99)
Potassium: 4.4 mmol/L (ref 3.5–5.1)
Sodium: 138 mmol/L (ref 135–145)
Total Bilirubin: 0.4 mg/dL (ref 0.3–1.2)
Total Protein: 7.5 g/dL (ref 6.5–8.1)

## 2019-08-13 MED ORDER — SODIUM CHLORIDE 0.9% FLUSH: 3.0000 mL | Freq: Once | INTRAVENOUS | Status: DC

## 2019-08-13 NOTE — ED Triage Notes (Signed)
Pt with wound to the bottom of his foot that he just noticed today. Hx of neuropathy. (Foot is warm to touch, area of necrosis to the plantar surface of the left foot)

## 2019-08-13 NOTE — ED Notes (Signed)
Please contact Kaye Mitro (Wife) when pt is roomed [phone].

## 2019-08-14 MED ORDER — SODIUM CHLORIDE 0.9 % IV SOLN
1.5000 g | Freq: Once | INTRAVENOUS | Status: DC
  Filled 2019-08-14: qty 4

## 2019-08-14 MED ORDER — DOXYCYCLINE HYCLATE 100 MG PO CAPS: 100.0000 mg | ORAL_CAPSULE | Freq: Two times a day (BID) | ORAL | 0 refills | Status: DC

## 2019-08-14 MED ORDER — DOXYCYCLINE HYCLATE 100 MG PO TABS
100.0000 mg | ORAL_TABLET | Freq: Once | ORAL | Status: AC
  Administered 2019-08-14: 100 mg via ORAL
  Filled 2019-08-14: qty 1

## 2019-08-14 MED ORDER — AMOXICILLIN-POT CLAVULANATE 875-125 MG PO TABS
1.0000 | ORAL_TABLET | Freq: Once | ORAL | Status: AC
  Administered 2019-08-14: 1 via ORAL
  Filled 2019-08-14: qty 1

## 2019-08-14 MED ORDER — AMOXICILLIN-POT CLAVULANATE 875-125 MG PO TABS: 1.0000 | ORAL_TABLET | Freq: Two times a day (BID) | ORAL | 0 refills | Status: DC

## 2019-08-14 NOTE — ED Notes (Signed)
Pt called out for restroom assistance.  Offered pt a urinal but pt advised he is incontinent and wears a diaper, which would make it difficult to use a urinal.  Pt asked to walk to a nearby restroom.  Placed a non-slip sock on pt's left foot (pt still has shoe and sock on his right foot) and pt was able to walk to restroom using his cane.  Pt's gait was steady.  Pt will obtain urine sample while using the restroom. Instructed pt to pull call-cord if he needs assistance, and he agreed.

## 2019-08-14 NOTE — ED Notes (Signed)
Attempted IV x3

## 2019-08-14 NOTE — ED Provider Notes (Signed)
[facility] DEPT Provider Note   CSN: [id number] Arrival date & time: 08/13/19  2111     History Chief Complaint  Patient presents with  . Wound Check    Tim [name] is a 79 y.o. male.  Patient presents to the emergency department for evaluation of a wound on the bottom of his foot.  Patient reports that he has a history of diabetic foot ulcers and infections, has had multiple toe amputations in the past.  He reports that he has not had problems for a couple of years but has not really been checking his feet.  He noticed the wound on the bottom portion of his left foot tonight.  There has not been any drainage.  No fever.        Past Medical History:  Diagnosis Date  . ASTHMA   . BPH (benign prostatic hypertrophy)   . CAD (coronary artery disease)    Nonobstructive by 12/2012 cath; then 03/2016 he required BMS to RCA ([facility]).  In-stent restenosis on cath 11/01/16, baloon angioplasty successful--pt to take plavix (no ASA), + eliquis (for PAF).  [name] CATARACT, HX OF   . Chronic combined systolic and diastolic CHF (congestive heart failure) ([place]) 05/2016   Ischemic CM.  04/2018 EF 40-45%, grd I DD.  [name] Chronic pain syndrome    Lumbar DDD; chronic neuropathic pain (DM); has spinal stimulator and sees pain mgmt MD  . COPD   . Diabetes mellitus type 2 with complications (HCC)    GYJ8H jumped from 5.7% to 6.1% 03/2015---started metformin at that time.  DM 2 dx by fasting gluc criteria 2018.  Has chronic neuropath pain  . Elevated transaminase level 02/2016   Suspect due to fatty liver (documented on u/s 2007). Plan repeat labs 03/2016.  [name] Essential hypertension, benign   . Fatty liver 2007   2007 u/s showed fatty liver with hepatosplenomegaly.  2019 repeat u/s->fatty liver but no cirrhosis or hepatosplenomegaly.  [name] GERD (gastroesophageal reflux disease)   . Glaucoma   . GOUT   . Hallux valgus (acquired)   . HH (hiatus hernia)   .  HYPERCHOLESTEROLEMIA-PURE   . Hypogonadism male   . Lumbosacral neuritis   . Lumbosacral spondylosis    Lumbar spinal stenosis with neurogenic claudication--contributes to his chronic pain syndrome  . Normal memory function 08/2014   Neuropsychological testing ([facility]): no cognitive impairment or sign of neurodegenerative disorder.  Likely has adjustment d/o with mixed anxiety/depressed features and may benefit from low dose SNRI.    [name] Normocytic anemia 03/2016   Mild-pt needs ferritin and vit B12 level checked (as of 03/22/16)  . NSTEMI (non-ST elevated myocardial infarction) ([place]) 03/20/2016   BMS to RCA  . Obesity hypoventilation syndrome ([place])   . Obesity, unspecified   . Orthostatic hypotension   . OSA on CPAP    8 cm H2O  . OSTEOARTHRITIS   . Paroxysmal atrial fibrillation ([place]) 2003    (? chronic?) Off anticoag for a while due to falls.  Then apixaban started 12/2014.  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 Ella Bodo): neuropathic pain->diff to treat, failed nucynta, failed spinal stimul trial, oxycontin hs + tramadol + gabap as of 12/2017 f/u Dr. Letta Pate.  . Personal history of colonic adenoma 10/30/2012   Diminutive adenoma, consider repeat 2019 per GI  . Presence of cardiac defibrillator 11/07/2017  . Primary osteoarthritis of both knees    Bone on bone of medial compartments, +  signif patellofemoral arth bilat.--supartz inj series started 09/12/17  . PUD (peptic ulcer disease)   . PULMONARY HYPERTENSION, HX OF   . Secondary male hypogonadism 2017  . 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 Shortness of breath    with exertion: much improved s/p TAVR and treatment for CHF.  . Sick sinus syndrome (HCC)    PPM placed  . Thrombocytopenia ([place]) 2018   HSM on 2007 abd u/s---suspect some mild splenic sequestration chronically.  [name] Unspecified glaucoma(365.9)    . Unspecified hereditary and idiopathic peripheral neuropathy approx age 7   bilat LE's, ? left arm, too.  Feet became progressively numb + left foot pain intermittently.  Pt may be trying a spinal stimulator (as of 05/2015)  . VENOUS INSUFFICIENCY    Being followed by Dr. Sharol Given as of 10/2016 for two R LL venous stasis ulcers/skin tears.  Healed as of 10/30/16 f/u with Dr. Sharol Given.  . VENTRAL HERNIA     Patient Active Problem List   Diagnosis Date Noted  . 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 Diabetic neuropathy, painful ([place]) 12/26/2016  . Neuropathic pain of foot, left 06/20/2016  . 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 Status post amputation of left great toe ([place]) 06/08/2015  . Spinal stenosis of lumbar region 02/16/2015  . Hereditary and idiopathic peripheral neuropathy 01/12/2015  . Oral thrush 12/28/2014  . Postural dizziness 05/24/2014  . Paresthesias with subjective weakness 01/30/2014  . Bilateral thigh pain 01/30/2014  . Abdominal wall pain in right upper quadrant 01/22/2014  . Gross hematuria 09/25/2013  . Intertrigo 09/25/2013  . IBS (irritable bowel syndrome) 09/11/2013  . Chronic pain syndrome 08/06/2013  . CAD (coronary artery disease), native coronary artery 12/25/2012  . Paroxysmal atrial fibrillation ([place]) 12/25/2012  . Moderate aortic stenosis 12/25/2012  . Aortic stenosis 12/24/2012  . Personal history of colonic adenoma 10/30/2012  . Diverticulosis of colon (without mention of hemorrhage) 10/30/2012  . Internal hemorrhoids 10/30/2012  . Change in bowel habits intermittent loose stools 10/30/2012  . Routine health maintenance 06/09/2012  . Hereditary peripheral neuropathy 10/10/2011  . Long term current use of anticoagulant - apixiban 08/16/2010  . Dyspnea on exertion 09/08/2009  . HYPERCHOLESTEROLEMIA-PURE 01/13/2009  . Essential hypertension, benign 01/13/2009  . EDEMA 01/13/2009  . GOUT  01/12/2009  . GLAUCOMA 01/12/2009  . Venous (peripheral) insufficiency 01/12/2009  . COPD (chronic obstructive pulmonary disease) ([place]) 01/12/2009  . VENTRAL HERNIA 01/12/2009  . OSTEOARTHRITIS 01/12/2009  . ARTHRITIS 01/12/2009  . DEGENERATIVE DISC DISEASE 01/12/2009  . CATARACT, HX OF 01/12/2009  . HEART MURMUR, HX OF 01/12/2009  . BENIGN PROSTATIC HYPERTROPHY, HX OF 01/12/2009  . Obesity 01/17/2007  . OBSTRUCTIVE SLEEP APNEA 01/17/2007  . ASTHMA 01/17/2007  . GERD 01/17/2007  . HALLUX VALGUS, ACQUIRED 01/17/2007  . PULMONARY HYPERTENSION, HX OF 01/17/2007    Past Surgical History:  Procedure Laterality Date  . AMPUTATION Left 04/11/2013   Procedure: AMPUTATION DIGIT Left 3rd toe;  Surgeon: [name], MD;  Location: [facility];  Service: Orthopedics;  Laterality: Left;  Left 3rd toe amputation at [facility]  . CARDIAC CATHETERIZATION  1997; 03/10/16   1997 Non-obstructive disease.  03/2016 BMS to RCA, with 25% pDiag dz, o/w normal cors per cath 03/07/16.  Cath 11/01/16: in stent restenosis, successful baloon angioplasty.  [name] CARDIAC CATHETERIZATION  12/24/2012   mild <  20% LCx, prox 30% RCA; LVEF 55-65% , moderate pulmonary HTN, moderate AS  . CARDIAC DEFIBRILLATOR PLACEMENT  11/07/2017   [facility] MRI Quad CRT defibrillator  . CARDIOVASCULAR STRESS TEST  05/11/16 ([facility])   Myocardial perfusion imaging:  No ischemia; scar in apex, global hypokinesis, EF 36%.  . Carotid dopplers  03/09/2016   [facility]: no hemodynamically significant stenosis on either side.  . CHOLECYSTECTOMY    . COLONOSCOPY    . COLONOSCOPY [name] 10/30/2012   Procedure: COLONOSCOPY;  Surgeon: [name], MD;  Location: [facility];  Service: Endoscopy;  Laterality: [name];  . CORONARY ANGIOPLASTY WITH STENT PLACEMENT  03/2016; 04/2017   2017-[facility]: BMS to RCA-pt was placed on Brilinta.  04/2017: DES to RCA.  [name] EYE SURGERY Bilateral cataract  . HEMORRHOID SURGERY    . INTRAOCULAR LENS INSERTION Bilateral   . KNEE SURGERY Right    . LEFT AND RIGHT HEART CATHETERIZATION WITH CORONARY ANGIOGRAM [name] 12/24/2012   Procedure: LEFT AND RIGHT HEART CATHETERIZATION WITH CORONARY ANGIOGRAM;  Surgeon: [name], MD;  Location: [facility] CATH LAB;  Service: Cardiovascular;  Laterality: [name];  . LEG SURGERY Bilateral    lenghtening   . PACEMAKER PLACEMENT  04/13/2016   2nd deg HB after TAVR, pt had DC MDT PPM placed.  [name] SHOULDER ARTHROSCOPY  08/30/2011   Procedure: ARTHROSCOPY SHOULDER;  Surgeon: [name], MD;  Location: [facility];  Service: Orthopedics;  Laterality: Right;  Right Shoulder Arthroscopy, Debridement, and Decompression  . SPINAL CORD STIMULATOR INSERTION [name] 09/10/2015   Procedure: LUMBAR SPINAL CORD STIMULATOR INSERTION;  Surgeon: [name], MD;  Location: [facility] NEURO ORS;  Service: Neurosurgery;  Laterality: [name];  . 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  R big toe surg due to osteoarth  . TONSILLECTOMY    . traeculectomy Left    eye  . TRANSCATHETER AORTIC VALVE REPLACEMENT, TRANSFEMORAL  04/11/2016  . TRANSESOPHAGEAL ECHOCARDIOGRAM  03/09/2016   [facility]: EF 55-60%, PFO seen with bi-directional shunting, no thrombus in appendage.  . TRANSTHORACIC ECHOCARDIOGRAM  01/2015; 01/2016; 05/18/16; 09/18/16, 05/2017, 08/2017   01/2015 No signif change in aortic stenosis (moderate).  01/2016 Severe LVH w/small LV cavity, EF 60-65%, grade I diast dysfxn.  05/2016 (s/p TAVR): EF 50-55%, grd I DD, biopros AV good.  08/2016--EF 50-55%, LV septal motion c/w conduction abnl, grd I DD,mild MS,bioprosth aortic valve well seated, w/trace AR. 05/2017 TTE EF 35%. 08/2017-EF 35%, mod diff hypokin LV, grd I DD, biopros AV good.   . TRANSTHORACIC ECHOCARDIOGRAM     04/2018: EF 40-45%, mod diffuse LV hypokinesis, grd I DD, bioprosth AV well seated, no AS or AR.  [name] VITRECTOMY         Family History  Problem Relation Age of Onset  . Hypertension Mother   . Coronary artery disease Mother   . Heart attack Mother   . Neuropathy Mother   .  Pulmonary fibrosis Father        asbestosis    Social History   Tobacco Use  . Smoking status: Never Smoker  . Smokeless tobacco: Never Used  Substance Use Topics  . Alcohol use: No    Alcohol/week: 0.0 standard drinks  . Drug use: No    Types: Oxycodone    Home Medications Prior to Admission medications   Medication Sig Start Date End Date Taking? Authorizing Provider  albuterol (PROVENTIL HFA;VENTOLIN HFA) 108 (90 BASE) MCG/ACT inhaler Inhale 2 puffs into the lungs 4 (four) times daily as  needed for wheezing or shortness of breath.   Yes [provider]  allopurinol (ZYLOPRIM) 300 MG tablet TAKE 1 TABLET BY MOUTH ONCE DAILY WITH FOOD Patient taking differently: Take 300 mg by mouth daily.  06/12/19  Yes McGowen, Adrian Blackwater, MD  apixaban (ELIQUIS) 5 MG TABS tablet Take 1 tablet (5 mg total) by mouth 2 (two) times daily. 01/02/17  Yes Lelon Perla, MD  ASPIRIN LOW DOSE 81 MG EC tablet Take 81 mg by mouth daily. 02/03/19  Yes [provider]  B Complex-C (B-COMPLEX WITH VITAMIN C) tablet Take 1 tablet by mouth daily. Take 1 tablet daily, 285m   Yes [provider]  clotrimazole-betamethasone (LOTRISONE) cream apply to affected area twice a day Patient taking differently: Apply 1 application topically 2 (two) times daily as needed (affected area).  11/04/15  Yes McGowen, PAdrian Blackwater MD  ezetimibe (ZETIA) 10 MG tablet take 1 tablet by mouth once daily 01/18/17  Yes Crenshaw, BDenice Bors MD  finasteride (PROSCAR) 5 MG tablet TAKE 1 TABLET BY MOUTH EVERY DAY 05/06/19  Yes McGowen, PAdrian Blackwater MD  fluticasone (FLONASE) 50 MCG/ACT nasal spray Place 2 sprays into both nostrils as needed for allergies or rhinitis.   Yes [provider]  Fluticasone-Salmeterol (ADVAIR DISKUS) 250-50 MCG/DOSE AEPB Inhale 1 puff into the lungs 2 (two) times daily.  03/19/19  Yes [provider]  gabapentin (NEURONTIN) 800 MG tablet TAKE 1 TABLET (800 MG TOTAL) BY MOUTH 4 (FOUR)  TIMES DAILY. 04/21/19  Yes Kirsteins, ALuanna Salk MD  isosorbide mononitrate (IMDUR) 60 MG 24 hr tablet TAKE 1 TABLET BY MOUTH EVERY DAY Patient taking differently: Take 60 mg by mouth 2 (two) times daily.  07/14/19  Yes McGowen, PAdrian Blackwater MD  metoprolol succinate (TOPROL-XL) 25 MG 24 hr tablet Take 1 tablet by mouth daily. Along w/ 563mto equal 7593maily   Yes [provider]  metoprolol succinate (TOPROL-XL) 50 MG 24 hr tablet Take 50 mg by mouth daily. Take with or immediately following a meal.   Yes [provider]  Multiple Vitamin (MULTIVITAMIN) capsule Take 1 capsule by mouth daily.   Yes [provider]  niacin (NIASPAN) 1000 MG CR tablet TAKE 1 TABLET BY MOUTH TWICE A DAY Patient taking differently: Take 1,000 mg by mouth 2 (two) times daily.  04/21/19  Yes McGowen, PhiAdrian BlackwaterD  nitroGLYCERIN (NITROSTAT) 0.4 MG SL tablet  09/04/17  Yes [provider]  omeprazole (PRILOSEC) 10 MG capsule TAKE 1 CAPSULE BY MOUTH EVERY DAY Patient taking differently: Take 10 mg by mouth daily.  05/05/19  Yes McGowen, PhiAdrian BlackwaterD  rosuvastatin (CRESTOR) 40 MG tablet take 1 tablet by mouth once daily 02/05/17  Yes Crenshaw, BriDenice BorsD  sacubitril-valsartan (ENTRESTO) 24-26 MG Take 1 tablet by mouth 2 (two) times daily. 07/19/17  Yes [provider]  torsemide (DEMADEX) 20 MG tablet Take 20 mg by mouth daily as needed (fluid).  07/26/18  Yes [provider]  VITAMIN D, CHOLECALCIFEROL, PO Take 1 tablet by mouth daily.    Yes [provider]  cyclobenzaprine (FLEXERIL) 5 MG tablet Take 0.5 tablet to 1 tablet at bedtime  for muscle spasm. Patient not taking: Reported on 12/25/2018 10/21/18   ThoBayard HuggerP  Fluticasone-Salmeterol (ADVAIR) 250-50 MCG/DOSE AEPB Inhale 1 puff into the lungs daily as needed (for nasal congestion).     [provider]  KLOR-CON M20 20 MEQ tablet TAKE 1 TABLET BY MOUTH  EVERY DAY Patient not taking: Reported on  08/14/2019 05/06/19   Tammi Sou, MD  mupirocin ointment (BACTROBAN) 2 % Apply 1 application topically 2 (two) times daily. Apply to the affected area 2 times a day Patient not taking: Reported on 12/25/2018 10/09/16   [name], MD    Allergies    Brimonidine tartrate, Brimonidine tartrate, Brinzolamide, Latanoprost, Nucynta [tapentadol], Sulfa antibiotics, Timolol maleate, Cardizem  [diltiazem hcl], Diltiazem, Rofecoxib, Vancomycin, Codeine, Celecoxib, Colchicine, and Tape  Review of Systems   Review of Systems  Skin: Positive for wound.  All other systems reviewed and are negative.   Physical Exam Updated Vital Signs BP 127/87   Pulse 73   Temp 98.2 F (36.8 C) (Oral)   Resp (!) 21   SpO2 99%   Physical Exam Vitals and nursing note reviewed.  Constitutional:      General: He is not in acute distress.    Appearance: Normal appearance. He is well-developed.  HENT:     Head: Normocephalic and atraumatic.     Right Ear: Hearing normal.     Left Ear: Hearing normal.     Nose: Nose normal.  Eyes:     Conjunctiva/sclera: Conjunctivae normal.     Pupils: Pupils are equal, round, and reactive to light.  Cardiovascular:     Rate and Rhythm: Regular rhythm.     Heart sounds: S1 normal and S2 normal. No murmur. No friction rub. No gallop.   Pulmonary:     Effort: Pulmonary effort is normal. No respiratory distress.     Breath sounds: Normal breath sounds.  Chest:     Chest wall: No tenderness.  Abdominal:     General: Bowel sounds are normal.     Palpations: Abdomen is soft.     Tenderness: There is no abdominal tenderness. There is no guarding or rebound. Negative signs include Murphy's sign and McBurney's sign.     Hernia: No hernia is present.  Musculoskeletal:        General: Normal range of motion.     Cervical back: Normal range of motion and neck supple.  Skin:    General: Skin is warm and dry.     Findings: Erythema present. No rash.     Comments: 1 cm  ulceration with surrounding callus formation plantar aspect of left foot over MTP joints, very slight surrounding erythema without induration.  No fluctuance.  No drainage.  Neurological:     Mental Status: He is alert and oriented to person, place, and time.     GCS: GCS eye subscore is 4. GCS verbal subscore is 5. GCS motor subscore is 6.     Cranial Nerves: No cranial nerve deficit.     Sensory: No sensory deficit.     Coordination: Coordination normal.  Psychiatric:        Speech: Speech normal.        Behavior: Behavior normal.        Thought Content: Thought content normal.     ED Results / Procedures / Treatments   Labs (all labs ordered are listed, but only abnormal results are displayed) Labs Reviewed  COMPREHENSIVE METABOLIC PANEL - Abnormal; Notable for the following components:      Result Value   Glucose, Bld 142 (*)    All other components within normal limits  CBC WITH DIFFERENTIAL/PLATELET    EKG None  Radiology DG Foot Complete Left  Result Date: 08/13/2019 CLINICAL DATA:  Wound to plantar surface of  foot. EXAM: LEFT FOOT - COMPLETE 3+ VIEW COMPARISON:  None. 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 No acute bony abnormality. Specifically, no fracture, subluxation, or dislocation. Soft tissue gas noted along the plantar distal surface. No radiographic changes of osteomyelitis. IMPRESSION: No radiographic changes of osteomyelitis. Electronically Signed   By: Rolm Baptise M.D.   On: 08/13/2019 21:45    Procedures Procedures (including critical care time)  Medications Ordered in ED Medications  sodium chloride flush (NS) 0.9 % injection 3 mL (has no administration in time range)    ED Course  I have reviewed the triage vital signs and the nursing notes.  Pertinent labs & imaging results that were available during my care of the patient were reviewed by me and considered in my medical decision making (see chart for  details).    MDM Rules/Calculators/A&P                      Patient presents with what appears to be a chronic ulceration of the foot.  He feels that the wound just popped up, but it appears to have been there for some time.  There is some very slight surrounding erythema but no drainage.  Lab work is normal.  No significant leukocytosis.  X-ray does not show any gas formation and no signs of osteomyelitis.  Patient is followed by Dr. Sharol Given.  Will initiate antibiotics and have him follow-up as an outpatient.  He does not appear to have any significant infection that would require hospitalization at this time.  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

## 2019-08-19 ENCOUNTER — Ambulatory Visit (INDEPENDENT_AMBULATORY_CARE_PROVIDER_SITE_OTHER): Payer: Medicare Other | Admitting: Physician Assistant

## 2019-08-19 ENCOUNTER — Encounter: Payer: Self-pay | Admitting: Physician Assistant

## 2019-08-19 ENCOUNTER — Other Ambulatory Visit: Payer: Self-pay

## 2019-08-19 VITALS — Ht 72.0 in | Wt 340.0 lb

## 2019-08-19 DIAGNOSIS — L97521 Non-pressure chronic ulcer of other part of left foot limited to breakdown of skin: Secondary | ICD-10-CM

## 2019-08-19 MED ORDER — CADEXOMER IODINE 0.9 % EX GEL: 1.0000 "application " | Freq: Every day | CUTANEOUS | 3 refills | Status: DC | PRN

## 2019-08-19 NOTE — Progress Notes (Signed)
Office Visit Note   Patient: Tim [name]           Date of Birth: May 02, 1941           MRN: [id number] Visit Date: 08/19/2019              Requested by: Tammi Sou, MD [address] [place],  [place] [address] PCP: Tammi Sou, MD  Chief Complaint  Patient presents with  . Left Foot - Wound Check      HPI: This is a pleasant 79 year old gentleman who presents today with a left forefoot ulcer.  He has a history of neuropathy but has a relatively low hemoglobin A1c.  He was seen in the emergency room recently because of a noticed nickel sized ulceration beneath the second metatarsal head.  X-rays were done and he was placed on doxycycline and Augmentin.  He also has been doing daily dressing changes.  Assessment & Plan: Visit Diagnoses: No diagnosis found.  Plan: I had a discussion with him he will continue to use Iodosorb dressings and should avoid placing weight in this area as much as possible.  I would like for him to come back and see Dr. Sharol Given next week and I suspect a more extensive debridement may be done at that time.  Follow-Up Instructions: No follow-ups on file.   Ortho Exam  [name], oriented, no adenopathy, well-dressed, normal affect, normal respiratory effort. He has a 3 x 3 round ulcer beneath the second metatarsal head there is some gray fibrinous tissue that I did clean out there is no surrounding cellulitis no fluctuance.  X-rays taken in the emergency room did not demonstrate any evidence of osteomyelitis there was some gas right in the area superficially of where the current ulcer is.  He did have a pulse that was weakly palpable and was biphasic by Doppler after obtaining verbal consent I did trim back this callused area around the ulcer to soft healthy tissue.  However I do think a more extensive debridement may be needed.  Patient discussed if he had to have further amputations what this might be.  I told him that I did not think  this was necessary at this time but he could discuss this with Dr. Sharol Given  Imaging: No results found. No images are attached to the encounter.  Labs: Lab Results  Component Value Date   HGBA1C 6.1 03/27/2019   HGBA1C 6.1 09/16/2018   HGBA1C 6.0 06/06/2018   ESRSEDRATE 20 02/09/2016   ESRSEDRATE 36 (H) 01/28/2014   LABURIC 3.5 (L) 09/10/2013   LABORGA NO GROWTH 08/25/2016     Lab Results  Component Value Date   ALBUMIN 4.0 08/13/2019   ALBUMIN 3.6 03/27/2019   ALBUMIN 4.1 06/06/2018   LABURIC 3.5 (L) 09/10/2013    No results found for: MG No results found for: VD25OH  No results found for: PREALBUMIN CBC EXTENDED Latest Ref Rng & Units 08/13/2019 03/27/2019 09/16/2018  WBC 4.0 - 10.5 K/uL 7.8 7.8 10.0  RBC 4.22 - 5.81 MIL/uL 4.32 3.95(L) 4.13(L)  HGB 13.0 - 17.0 g/dL 13.0 12.0(L) 12.9(L)  HCT 39.0 - 52.0 % 39.3 35.4(L) 38.3(L)  PLT 150 - 400 K/uL 170 118.0(L) 126.0(L)  NEUTROABS 1.7 - 7.7 K/uL 5.1 - -  LYMPHSABS 0.7 - 4.0 K/uL 1.5 - -     Body mass index is 46.11 kg/m.  Orders:  No orders of the defined types were placed in this encounter.  No orders of the defined types were placed in this encounter.    Procedures: No procedures performed  Clinical Data: No additional findings.  ROS:  All other systems negative, except as noted in the HPI. Review of Systems  Objective: Vital Signs: Ht 6' (1.829 m)   Wt (!) 340 lb (154.2 kg)   BMI 46.11 kg/m   Specialty Comments:  No specialty comments available.  PMFS History: Patient Active Problem List   Diagnosis Date Noted  . 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 Diabetic neuropathy, painful ([place]) 12/26/2016  . Neuropathic pain of foot, left 06/20/2016  . 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 Status post amputation of left great toe ([place]) 06/08/2015  . Spinal stenosis of lumbar region 02/16/2015  . Hereditary and idiopathic peripheral  neuropathy 01/12/2015  . Oral thrush 12/28/2014  . Postural dizziness 05/24/2014  . Paresthesias with subjective weakness 01/30/2014  . Bilateral thigh pain 01/30/2014  . Abdominal wall pain in right upper quadrant 01/22/2014  . Gross hematuria 09/25/2013  . Intertrigo 09/25/2013  . IBS (irritable bowel syndrome) 09/11/2013  . Chronic pain syndrome 08/06/2013  . CAD (coronary artery disease), native coronary artery 12/25/2012  . Paroxysmal atrial fibrillation ([place]) 12/25/2012  . Moderate aortic stenosis 12/25/2012  . Aortic stenosis 12/24/2012  . Personal history of colonic adenoma 10/30/2012  . Diverticulosis of colon (without mention of hemorrhage) 10/30/2012  . Internal hemorrhoids 10/30/2012  . Change in bowel habits intermittent loose stools 10/30/2012  . Routine health maintenance 06/09/2012  . Hereditary peripheral neuropathy 10/10/2011  . Long term current use of anticoagulant - apixiban 08/16/2010  . Dyspnea on exertion 09/08/2009  . HYPERCHOLESTEROLEMIA-PURE 01/13/2009  . Essential hypertension, benign 01/13/2009  . EDEMA 01/13/2009  . GOUT 01/12/2009  . GLAUCOMA 01/12/2009  . Venous (peripheral) insufficiency 01/12/2009  . COPD (chronic obstructive pulmonary disease) ([place]) 01/12/2009  . VENTRAL HERNIA 01/12/2009  . OSTEOARTHRITIS 01/12/2009  . ARTHRITIS 01/12/2009  . DEGENERATIVE DISC DISEASE 01/12/2009  . CATARACT, HX OF 01/12/2009  . HEART MURMUR, HX OF 01/12/2009  . BENIGN PROSTATIC HYPERTROPHY, HX OF 01/12/2009  . Obesity 01/17/2007  . OBSTRUCTIVE SLEEP APNEA 01/17/2007  . ASTHMA 01/17/2007  . GERD 01/17/2007  . HALLUX VALGUS, ACQUIRED 01/17/2007  . PULMONARY HYPERTENSION, HX OF 01/17/2007   Past Medical History:  Diagnosis Date  . ASTHMA   . BPH (benign prostatic hypertrophy)   . CAD (coronary artery disease)    Nonobstructive by 12/2012 cath; then 03/2016 he required BMS to RCA ([facility]).  In-stent restenosis on cath 11/01/16, baloon angioplasty  successful--pt to take plavix (no ASA), + eliquis (for PAF).  [name] CATARACT, HX OF   . Chronic combined systolic and diastolic CHF (congestive heart failure) ([place]) 05/2016   Ischemic CM.  04/2018 EF 40-45%, grd I DD.  [name] Chronic pain syndrome    Lumbar DDD; chronic neuropathic pain (DM); has spinal stimulator and sees pain mgmt MD  . COPD   . Diabetes mellitus type 2 with complications (HCC)    FWY6V jumped from 5.7% to 6.1% 03/2015---started metformin at that time.  DM 2 dx by fasting gluc criteria 2018.  Has chronic neuropath pain  . Elevated transaminase level 02/2016   Suspect due to fatty liver (documented on u/s 2007). Plan repeat labs 03/2016.  [name] Essential hypertension, benign   . Fatty liver 2007   2007 u/s showed fatty liver with hepatosplenomegaly.  2019 repeat u/s->fatty liver but no cirrhosis  or hepatosplenomegaly.  [name] GERD (gastroesophageal reflux disease)   . Glaucoma   . GOUT   . Hallux valgus (acquired)   . HH (hiatus hernia)   . HYPERCHOLESTEROLEMIA-PURE   . Hypogonadism male   . Lumbosacral neuritis   . Lumbosacral spondylosis    Lumbar spinal stenosis with neurogenic claudication--contributes to his chronic pain syndrome  . Normal memory function 08/2014   Neuropsychological testing ([facility]): no cognitive impairment or sign of neurodegenerative disorder.  Likely has adjustment d/o with mixed anxiety/depressed features and may benefit from low dose SNRI.    [name] Normocytic anemia 03/2016   Mild-pt needs ferritin and vit B12 level checked (as of 03/22/16)  . NSTEMI (non-ST elevated myocardial infarction) ([place]) 03/20/2016   BMS to RCA  . Obesity hypoventilation syndrome ([place])   . Obesity, unspecified   . Orthostatic hypotension   . OSA on CPAP    8 cm H2O  . OSTEOARTHRITIS   . Paroxysmal atrial fibrillation ([place]) 2003    (? chronic?) Off anticoag for a while due to falls.  Then apixaban started 12/2014.  [name] Peripheral neuropathy    DPN (+[facility]; with  chronic neuropathic pain--Dr. Ella Bodo): neuropathic pain->diff to treat, failed nucynta, failed spinal stimul trial, oxycontin hs + tramadol + gabap as of 12/2017 f/u Dr. Letta Pate.  . Personal history of colonic adenoma 10/30/2012   Diminutive adenoma, consider repeat 2019 per GI  . Presence of cardiac defibrillator 11/07/2017  . Primary osteoarthritis of both knees    Bone on bone of medial compartments, + signif patellofemoral arth bilat.--supartz inj series started 09/12/17  . PUD (peptic ulcer disease)   . PULMONARY HYPERTENSION, HX OF   . Secondary male hypogonadism 2017  . 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 Shortness of breath    with exertion: much improved s/p TAVR and treatment for CHF.  . Sick sinus syndrome (HCC)    PPM placed  . Thrombocytopenia ([place]) 2018   HSM on 2007 abd u/s---suspect some mild splenic sequestration chronically.  [name] Unspecified glaucoma(365.9)   . Unspecified hereditary and idiopathic peripheral neuropathy approx age 23   bilat LE's, ? left arm, too.  Feet became progressively numb + left foot pain intermittently.  Pt may be trying a spinal stimulator (as of 05/2015)  . VENOUS INSUFFICIENCY    Being followed by Dr. Sharol Given as of 10/2016 for two R LL venous stasis ulcers/skin tears.  Healed as of 10/30/16 f/u with Dr. Sharol Given.  . VENTRAL HERNIA     Family History  Problem Relation Age of Onset  . Hypertension Mother   . Coronary artery disease Mother   . Heart attack Mother   . Neuropathy Mother   . Pulmonary fibrosis Father        asbestosis    Past Surgical History:  Procedure Laterality Date  . AMPUTATION Left 04/11/2013   Procedure: AMPUTATION DIGIT Left 3rd toe;  Surgeon: [name], MD;  Location: [facility];  Service: Orthopedics;  Laterality: Left;  Left 3rd toe amputation at [facility]  . CARDIAC CATHETERIZATION  1997; 03/10/16   1997 Non-obstructive disease.  03/2016 BMS to RCA, with 25%  pDiag dz, o/w normal cors per cath 03/07/16.  Cath 11/01/16: in stent restenosis, successful baloon angioplasty.  [name] CARDIAC CATHETERIZATION  12/24/2012   mild < 20% LCx, prox 30% RCA; LVEF 55-65% , moderate pulmonary HTN, moderate AS  . CARDIAC  DEFIBRILLATOR PLACEMENT  11/07/2017   [facility] MRI Quad CRT defibrillator  . CARDIOVASCULAR STRESS TEST  05/11/16 ([facility])   Myocardial perfusion imaging:  No ischemia; scar in apex, global hypokinesis, EF 36%.  . Carotid dopplers  03/09/2016   [facility]: no hemodynamically significant stenosis on either side.  . CHOLECYSTECTOMY    . COLONOSCOPY    . COLONOSCOPY [name] 10/30/2012   Procedure: COLONOSCOPY;  Surgeon: [name], MD;  Location: [facility];  Service: Endoscopy;  Laterality: [name];  . CORONARY ANGIOPLASTY WITH STENT PLACEMENT  03/2016; 04/2017   2017-[facility]: BMS to RCA-pt was placed on Brilinta.  04/2017: DES to RCA.  [name] EYE SURGERY Bilateral cataract  . HEMORRHOID SURGERY    . INTRAOCULAR LENS INSERTION Bilateral   . KNEE SURGERY Right   . LEFT AND RIGHT HEART CATHETERIZATION WITH CORONARY ANGIOGRAM [name] 12/24/2012   Procedure: LEFT AND RIGHT HEART CATHETERIZATION WITH CORONARY ANGIOGRAM;  Surgeon: [name], MD;  Location[facility] CATH LAB;  Service: Cardiovascular;  Laterality: [name];  . LEG SURGERY Bilateral    lenghtening   . PACEMAKER PLACEMENT  04/13/2016   2nd deg HB after TAVR, pt had DC MDT PPM placed.  [name] SHOULDER ARTHROSCOPY  08/30/2011   Procedure: ARTHROSCOPY SHOULDER;  Surgeon: [name], MD;  Location: [facility];  Service: Orthopedics;  Laterality: Right;  Right Shoulder Arthroscopy, Debridement, and Decompression  . SPINAL CORD STIMULATOR INSERTION [name] 09/10/2015   Procedure: LUMBAR SPINAL CORD STIMULATOR INSERTION;  Surgeon: [name], MD;  Location: [facility];  Service: Neurosurgery;  Laterality: [name];  . 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  R big toe surg due to osteoarth  . TONSILLECTOMY    . traeculectomy Left     eye  . TRANSCATHETER AORTIC VALVE REPLACEMENT, TRANSFEMORAL  04/11/2016  . TRANSESOPHAGEAL ECHOCARDIOGRAM  03/09/2016   [facility]: EF 55-60%, PFO seen with bi-directional shunting, no thrombus in appendage.  . TRANSTHORACIC ECHOCARDIOGRAM  01/2015; 01/2016; 05/18/16; 09/18/16, 05/2017, 08/2017   01/2015 No signif change in aortic stenosis (moderate).  01/2016 Severe LVH w/small LV cavity, EF 60-65%, grade I diast dysfxn.  05/2016 (s/p TAVR): EF 50-55%, grd I DD, biopros AV good.  08/2016--EF 50-55%, LV septal motion c/w conduction abnl, grd I DD,mild MS,bioprosth aortic valve well seated, w/trace AR. 05/2017 TTE EF 35%. 08/2017-EF 35%, mod diff hypokin LV, grd I DD, biopros AV good.   . TRANSTHORACIC ECHOCARDIOGRAM     04/2018: EF 40-45%, mod diffuse LV hypokinesis, grd I DD, bioprosth AV well seated, no AS or AR.  [name] VITRECTOMY     Social History   Occupational History  . Occupation: Engineer, production    Comment: retired  Tobacco Use  . Smoking status: Never Smoker  . Smokeless tobacco: Never Used  Substance and Sexual Activity  . Alcohol use: No    Alcohol/week: 0.0 standard drinks  . Drug use: No    Types: Oxycodone  . Sexual activity: Not Currently

## 2019-08-26 ENCOUNTER — Other Ambulatory Visit: Payer: Self-pay

## 2019-08-26 ENCOUNTER — Ambulatory Visit (INDEPENDENT_AMBULATORY_CARE_PROVIDER_SITE_OTHER): Payer: Medicare Other | Admitting: Orthopedic Surgery

## 2019-08-26 ENCOUNTER — Ambulatory Visit: Payer: Self-pay

## 2019-08-26 ENCOUNTER — Encounter: Payer: Self-pay | Admitting: Orthopedic Surgery

## 2019-08-26 VITALS — Ht 72.0 in | Wt 340.0 lb

## 2019-08-26 DIAGNOSIS — L97521 Non-pressure chronic ulcer of other part of left foot limited to breakdown of skin: Secondary | ICD-10-CM | POA: Diagnosis not present

## 2019-08-26 DIAGNOSIS — L97524 Non-pressure chronic ulcer of other part of left foot with necrosis of bone: Secondary | ICD-10-CM

## 2019-08-26 DIAGNOSIS — M869 Osteomyelitis, unspecified: Secondary | ICD-10-CM

## 2019-08-27 ENCOUNTER — Other Ambulatory Visit: Payer: Self-pay | Admitting: Physician Assistant

## 2019-08-27 ENCOUNTER — Encounter: Payer: Self-pay | Admitting: Orthopedic Surgery

## 2019-08-27 NOTE — Progress Notes (Signed)
Office Visit Note   Patient: Tim [name]           Date of Birth: 1940-11-14           MRN: [id number] Visit Date: 08/26/2019              Requested by: Tammi Sou, MD [address] [place],  [place] [address] PCP: Tammi Sou, MD  Chief Complaint  [name]      HPI: Patient is a 79 year old gentleman who was seen for ulceration beneath the second metatarsal head left foot.  Patient complains of odor and drainage.  He is status post a great toe amputation.  Assessment & Plan: Visit Diagnoses:  1. Non-pressure chronic ulcer of other part of left foot limited to breakdown of skin ([place])   2. 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 Non-pressure chronic ulcer of other part of left foot with necrosis of bone ([place])     Plan: Discussed with the patient the fact that the ulcer probes to bone with involvement of the base of the proximal phalanx and the second metatarsal head and with previous great toe amputation his best option would be to proceed with a transmetatarsal amputation.  Risks and benefits were discussed including risk of the wound not healing need for additional surgery.  Patient states he understands wished to proceed at this time plan for outpatient left transmetatarsal amputation on Friday.  Follow-Up Instructions: Return in about 2 weeks (around 09/09/2019).   Ortho Exam  [name], oriented, no adenopathy, well-dressed, normal affect, normal respiratory effort. Examination patient has a good dorsalis pedis pulse he has a necrotic ulcer beneath the second metatarsal head.  There is no ascending cellulitis he does have venous swelling.  After informed consent a 10 blade knife was used to debride the skin and soft tissue back to bleeding viable granulation tissue silver nitrate was used for hemostasis the silver nitrate Q-tip probes all the way down to the MTP joint.  A sterile dressing was  applied.  Imaging: XR Foot Complete Left  Result Date: 08/27/2019 Three-view radiographs of the left foot shows silver nitrate that extends down to the MTP joint second toe with destructive bony changes  No images are attached to the encounter.  Labs: Lab Results  Component Value Date   HGBA1C 6.1 03/27/2019   HGBA1C 6.1 09/16/2018   HGBA1C 6.0 06/06/2018   ESRSEDRATE 20 02/09/2016   ESRSEDRATE 36 (H) 01/28/2014   LABURIC 3.5 (L) 09/10/2013   LABORGA NO GROWTH 08/25/2016     Lab Results  Component Value Date   ALBUMIN 4.0 08/13/2019   ALBUMIN 3.6 03/27/2019   ALBUMIN 4.1 06/06/2018   LABURIC 3.5 (L) 09/10/2013    No results found for: MG No results found for: VD25OH  No results found for: PREALBUMIN CBC EXTENDED Latest Ref Rng & Units 08/13/2019 03/27/2019 09/16/2018  WBC 4.0 - 10.5 K/uL 7.8 7.8 10.0  RBC 4.22 - 5.81 MIL/uL 4.32 3.95(L) 4.13(L)  HGB 13.0 - 17.0 g/dL 13.0 12.0(L) 12.9(L)  HCT 39.0 - 52.0 % 39.3 35.4(L) 38.3(L)  PLT 150 - 400 K/uL 170 118.0(L) 126.0(L)  NEUTROABS 1.7 - 7.7 K/uL 5.1 - -  LYMPHSABS 0.7 - 4.0 K/uL 1.5 - -     Body mass index is 46.11 kg/m.  Orders:  Orders Placed This Encounter  Procedures  . XR Foot Complete Left  No orders of the defined types were placed in this encounter.    Procedures: No procedures performed  Clinical Data: No additional findings.  ROS:  All other systems negative, except as noted in the HPI. Review of Systems  Objective: Vital Signs: Ht 6' (1.829 m)   Wt (!) 340 lb (154.2 kg)   BMI 46.11 kg/m   Specialty Comments:  No specialty comments available.  PMFS History: Patient Active Problem List   Diagnosis Date Noted  . 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 Diabetic neuropathy, painful ([place]) 12/26/2016  . Neuropathic pain of foot, left 06/20/2016  . 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 Status post amputation of left  great toe ([place]) 06/08/2015  . Spinal stenosis of lumbar region 02/16/2015  . Hereditary and idiopathic peripheral neuropathy 01/12/2015  . Oral thrush 12/28/2014  . Postural dizziness 05/24/2014  . Paresthesias with subjective weakness 01/30/2014  . Bilateral thigh pain 01/30/2014  . Abdominal wall pain in right upper quadrant 01/22/2014  . Gross hematuria 09/25/2013  . Intertrigo 09/25/2013  . IBS (irritable bowel syndrome) 09/11/2013  . Chronic pain syndrome 08/06/2013  . CAD (coronary artery disease), native coronary artery 12/25/2012  . Paroxysmal atrial fibrillation ([place]) 12/25/2012  . Moderate aortic stenosis 12/25/2012  . Aortic stenosis 12/24/2012  . Personal history of colonic adenoma 10/30/2012  . Diverticulosis of colon (without mention of hemorrhage) 10/30/2012  . Internal hemorrhoids 10/30/2012  . Change in bowel habits intermittent loose stools 10/30/2012  . Routine health maintenance 06/09/2012  . Hereditary peripheral neuropathy 10/10/2011  . Long term current use of anticoagulant - apixiban 08/16/2010  . Dyspnea on exertion 09/08/2009  . HYPERCHOLESTEROLEMIA-PURE 01/13/2009  . Essential hypertension, benign 01/13/2009  . EDEMA 01/13/2009  . GOUT 01/12/2009  . GLAUCOMA 01/12/2009  . Venous (peripheral) insufficiency 01/12/2009  . COPD (chronic obstructive pulmonary disease) ([place]) 01/12/2009  . VENTRAL HERNIA 01/12/2009  . OSTEOARTHRITIS 01/12/2009  . ARTHRITIS 01/12/2009  . DEGENERATIVE DISC DISEASE 01/12/2009  . CATARACT, HX OF 01/12/2009  . HEART MURMUR, HX OF 01/12/2009  . BENIGN PROSTATIC HYPERTROPHY, HX OF 01/12/2009  . Obesity 01/17/2007  . OBSTRUCTIVE SLEEP APNEA 01/17/2007  . ASTHMA 01/17/2007  . GERD 01/17/2007  . HALLUX VALGUS, ACQUIRED 01/17/2007  . PULMONARY HYPERTENSION, HX OF 01/17/2007   Past Medical History:  Diagnosis Date  . ASTHMA   . BPH (benign prostatic hypertrophy)   . CAD (coronary artery disease)    Nonobstructive by 12/2012  cath; then 03/2016 he required BMS to RCA ([facility]).  In-stent restenosis on cath 11/01/16, baloon angioplasty successful--pt to take plavix (no ASA), + eliquis (for PAF).  [name] CATARACT, HX OF   . Chronic combined systolic and diastolic CHF (congestive heart failure) ([place]) 05/2016   Ischemic CM.  04/2018 EF 40-45%, grd I DD.  [name] Chronic pain syndrome    Lumbar DDD; chronic neuropathic pain (DM); has spinal stimulator and sees pain mgmt MD  . COPD   . Diabetes mellitus type 2 with complications (HCC)    NGE9B jumped from 5.7% to 6.1% 03/2015---started metformin at that time.  DM 2 dx by fasting gluc criteria 2018.  Has chronic neuropath pain  . Elevated transaminase level 02/2016   Suspect due to fatty liver (documented on u/s 2007). Plan repeat labs 03/2016.  [name] Essential hypertension, benign   . Fatty liver 2007   2007 u/s showed fatty liver with hepatosplenomegaly.  2019 repeat u/s->fatty liver but no cirrhosis  or hepatosplenomegaly.  [name] GERD (gastroesophageal reflux disease)   . Glaucoma   . GOUT   . Hallux valgus (acquired)   . HH (hiatus hernia)   . HYPERCHOLESTEROLEMIA-PURE   . Hypogonadism male   . Lumbosacral neuritis   . Lumbosacral spondylosis    Lumbar spinal stenosis with neurogenic claudication--contributes to his chronic pain syndrome  . Normal memory function 08/2014   Neuropsychological testing ([facility]): no cognitive impairment or sign of neurodegenerative disorder.  Likely has adjustment d/o with mixed anxiety/depressed features and may benefit from low dose SNRI.    [name] Normocytic anemia 03/2016   Mild-pt needs ferritin and vit B12 level checked (as of 03/22/16)  . NSTEMI (non-ST elevated myocardial infarction) ([place]) 03/20/2016   BMS to RCA  . Obesity hypoventilation syndrome ([place])   . Obesity, unspecified   . Orthostatic hypotension   . OSA on CPAP    8 cm H2O  . OSTEOARTHRITIS   . Paroxysmal atrial fibrillation ([place]) 2003    (? chronic?) Off anticoag for a  while due to falls.  Then apixaban started 12/2014.  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 Ella Bodo): neuropathic pain->diff to treat, failed nucynta, failed spinal stimul trial, oxycontin hs + tramadol + gabap as of 12/2017 f/u Dr. Letta Pate.  . Personal history of colonic adenoma 10/30/2012   Diminutive adenoma, consider repeat 2019 per GI  . Presence of cardiac defibrillator 11/07/2017  . Primary osteoarthritis of both knees    Bone on bone of medial compartments, + signif patellofemoral arth bilat.--supartz inj series started 09/12/17  . PUD (peptic ulcer disease)   . PULMONARY HYPERTENSION, HX OF   . Secondary male hypogonadism 2017  . 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 Shortness of breath    with exertion: much improved s/p TAVR and treatment for CHF.  . Sick sinus syndrome (HCC)    PPM placed  . Thrombocytopenia ([place]) 2018   HSM on 2007 abd u/s---suspect some mild splenic sequestration chronically.  [name] Unspecified glaucoma(365.9)   . Unspecified hereditary and idiopathic peripheral neuropathy approx age 60   bilat LE's, ? left arm, too.  Feet became progressively numb + left foot pain intermittently.  Pt may be trying a spinal stimulator (as of 05/2015)  . VENOUS INSUFFICIENCY    Being followed by Dr. Sharol Given as of 10/2016 for two R LL venous stasis ulcers/skin tears.  Healed as of 10/30/16 f/u with Dr. Sharol Given.  . VENTRAL HERNIA     Family History  Problem Relation Age of Onset  . Hypertension Mother   . Coronary artery disease Mother   . Heart attack Mother   . Neuropathy Mother   . Pulmonary fibrosis Father        asbestosis    Past Surgical History:  Procedure Laterality Date  . AMPUTATION Left 04/11/2013   Procedure: AMPUTATION DIGIT Left 3rd toe;  Surgeon: [name], MD;  Location: [facility];  Service: Orthopedics;  Laterality: Left;  Left 3rd toe amputation at MTP  .  CARDIAC CATHETERIZATION  1997; 03/10/16   1997 Non-obstructive disease.  03/2016 BMS to RCA, with 25% pDiag dz, o/w normal cors per cath 03/07/16.  Cath 11/01/16: in stent restenosis, successful baloon angioplasty.  [name] CARDIAC CATHETERIZATION  12/24/2012   mild < 20% LCx, prox 30% RCA; LVEF 55-65% , moderate pulmonary HTN, moderate AS  . CARDIAC  DEFIBRILLATOR PLACEMENT  11/07/2017   [facility] MRI Quad CRT defibrillator  . CARDIOVASCULAR STRESS TEST  05/11/16 ([facility])   Myocardial perfusion imaging:  No ischemia; scar in apex, global hypokinesis, EF 36%.  . Carotid dopplers  03/09/2016   [facility]: no hemodynamically significant stenosis on either side.  . CHOLECYSTECTOMY    . COLONOSCOPY    . COLONOSCOPY [name] 10/30/2012   Procedure: COLONOSCOPY;  Surgeon: [name], MD;  Location: [facility];  Service: Endoscopy;  Laterality: [name];  . CORONARY ANGIOPLASTY WITH STENT PLACEMENT  03/2016; 04/2017   2017-[facility]: BMS to RCA-pt was placed on Brilinta.  04/2017: DES to RCA.  [name] EYE SURGERY Bilateral cataract  . HEMORRHOID SURGERY    . INTRAOCULAR LENS INSERTION Bilateral   . KNEE SURGERY Right   . LEFT AND RIGHT HEART CATHETERIZATION WITH CORONARY ANGIOGRAM [name] 12/24/2012   Procedure: LEFT AND RIGHT HEART CATHETERIZATION WITH CORONARY ANGIOGRAM;  Surgeon: [name], MD;  Location: [facility] C[facility];  Service: Cardiovascular;  Laterality: [name];  . LEG SURGERY Bilateral    lenghtening   . PACEMAKER PLACEMENT  04/13/2016   2nd deg HB after TAVR, pt had DC MDT PPM placed.  [name] SHOULDER ARTHROSCOPY  08/30/2011   Procedure: ARTHROSCOPY SHOULDER;  Surgeon: [name], MD;  Location: [facility];  Service: Orthopedics;  Laterality: Right;  Right Shoulder Arthroscopy, Debridement, and Decompression  . SPINAL CORD STIMULATOR INSERTION [name] 09/10/2015   Procedure: LUMBAR SPINAL CORD STIMULATOR INSERTION;  Surgeon: [name], MD;  Location: [facility];  Service: Neurosurgery;  Laterality: [name];  . TOE AMPUTATION Left    due  to osteomyelitis.  R big toe surg due to osteoarth  . TONSILLECTOMY    . traeculectomy Left    eye  . TRANSCATHETER AORTIC VALVE REPLACEMENT, TRANSFEMORAL  04/11/2016  . TRANSESOPHAGEAL ECHOCARDIOGRAM  03/09/2016   [facility]: EF 55-60%, PFO seen with bi-directional shunting, no thrombus in appendage.  . TRANSTHORACIC ECHOCARDIOGRAM  01/2015; 01/2016; 05/18/16; 09/18/16, 05/2017, 08/2017   01/2015 No signif change in aortic stenosis (moderate).  01/2016 Severe LVH w/small LV cavity, EF 60-65%, grade I diast dysfxn.  05/2016 (s/p TAVR): EF 50-55%, grd I DD, biopros AV good.  08/2016--EF 50-55%, LV septal motion c/w conduction abnl, grd I DD,mild MS,bioprosth aortic valve well seated, w/trace AR. 05/2017 TTE EF 35%. 08/2017-EF 35%, mod diff hypokin LV, grd I DD, biopros AV good.   . TRANSTHORACIC ECHOCARDIOGRAM     04/2018: EF 40-45%, mod diffuse LV hypokinesis, grd I DD, bioprosth AV well seated, no AS or AR.  [name] VITRECTOMY     Social History   Occupational History  . Occupation: Engineer, production    Comment: retired  Tobacco Use  . Smoking status: Never Smoker  . Smokeless tobacco: Never Used  Substance and Sexual Activity  . Alcohol use: No    Alcohol/week: 0.0 standard drinks  . Drug use: No    Types: Oxycodone  . Sexual activity: Not Currently

## 2019-08-28 ENCOUNTER — Other Ambulatory Visit (HOSPITAL_COMMUNITY)
Admission: RE | Admit: 2019-08-28 | Discharge: 2019-08-28 | Disposition: A | Discharge status: Home / Self Care | Payer: Medicare Other | Source: Ambulatory Visit | Attending: Orthopedic Surgery | Admitting: Orthopedic Surgery

## 2019-08-28 ENCOUNTER — Other Ambulatory Visit: Payer: Self-pay

## 2019-08-28 ENCOUNTER — Other Ambulatory Visit: Payer: Self-pay | Admitting: Family Medicine

## 2019-08-28 ENCOUNTER — Encounter (HOSPITAL_COMMUNITY): Payer: Self-pay | Admitting: Orthopedic Surgery

## 2019-08-28 DIAGNOSIS — Z20822 Contact with and (suspected) exposure to covid-19: Secondary | ICD-10-CM | POA: Diagnosis not present

## 2019-08-28 DIAGNOSIS — Z01812 Encounter for preprocedural laboratory examination: Secondary | ICD-10-CM | POA: Insufficient documentation

## 2019-08-28 LAB — SARS CORONAVIRUS 2 (TAT 6-24 HRS): SARS Coronavirus 2: NEGATIVE

## 2019-08-28 NOTE — Anesthesia Preprocedure Evaluation (Addendum)
Anesthesia Evaluation  Patient identified by MRN, date of birth, ID band Patient awake    Reviewed: Allergy & Precautions, NPO status , Patient's Chart, lab work & pertinent test results, reviewed documented beta blocker date and time   Airway Mallampati: II  TM Distance: >3 FB Neck ROM: Full    Dental  (+) Edentulous Upper, Dental Advisory Given   Pulmonary shortness of breath, asthma , sleep apnea and Continuous Positive Airway Pressure Ventilation , COPD,  COPD inhaler,  H/o pulm htn  PFTs 07/30/18 ([facility]): FEV1  2.47 liters Final  Comment: 86%  FVC  3.57 liters Final  Comment: 80%  FEV1/FVC  69 % Final  Comment: 69%  DLCO 32.9 ml/mmHg sec Final  Comment: 108%  Impression: Mild obstruction. Please see official interpretation   Pulmonary exam normal breath sounds clear to auscultation       Cardiovascular hypertension, Pt. on home beta blockers and Pt. on medications + CAD, + Past MI, + Cardiac Stents and +CHF  Normal cardiovascular exam+ dysrhythmias Atrial Fibrillation + pacemaker + Cardiac Defibrillator + Valvular Problems/Murmurs (s/p TAVR 2017) AS  Rhythm:Regular Rate:Normal  Nuclear stress test 07/09/19 ([facility]): IMPRESSION: No evidence of inducible ischemia. Calculated EF is 48%.   s/p BMS RCA 03/10/16; s/p balloon angioplasty RCA for ISR 11/01/16; s/p DES RCA for ISR 04/27/17   Neuro/Psych negative neurological ROS  negative psych ROS   GI/Hepatic Neg liver ROS, hiatal hernia, PUD, GERD  Medicated,  Endo/Other  diabetes, Type 2Morbid obesity (BMI 46)  Renal/GU negative Renal ROS  negative genitourinary   Musculoskeletal  (+) Arthritis ,   Abdominal   Peds  Hematology  (+) Blood dyscrasia (on eliquis), ,   Anesthesia Other Findings   Reproductive/Obstetrics                           Anesthesia Physical Anesthesia Plan  ASA: IV  Anesthesia Plan: MAC and Regional    Post-op Pain Management:  Regional for Post-op pain   Induction: Intravenous  PONV Risk Score and Plan: 1 and Propofol infusion and Treatment may vary due to age or medical condition  Airway Management Planned: Natural Airway  Additional Equipment:   Intra-op Plan:   Post-operative Plan:   Informed Consent: I have reviewed the patients History and Physical, chart, labs and discussed the procedure including the risks, benefits and alternatives for the proposed anesthesia with the patient or authorized representative who has indicated his/her understanding and acceptance.     Dental advisory given  Plan Discussed with: CRNA  Anesthesia Plan Comments: ( )      Anesthesia Quick Evaluation

## 2019-08-28 NOTE — Progress Notes (Signed)
Spoke with pt for pre-op call. Pt has an extensive cardiac history with CAD, Mitral valve repair, a-fib, sick sinus syndrome and has an ICD. Pt's cardiologist is Dr. Clovis Riley. Pt denies any recent chest pain or sob. Pt is a type 2 diabetic. Last A1C was 6.1 on 07/09/19. He states he does not have a meter to check his blood sugar, pt is on no medications for Diabetes.  Covid test done today. Pt states he's been in quarantine since the test was done and understands to continue to be in quarantine until he gets to the hospital in the AM.  Instructed pt to not eat food after midnight tonight, but may have clear liquids until 7:30 AM tomorrow. Pt to drink 10 ounces of water just before 7:30 AM. Pt voiced understanding.  Chart sent for review by Anesthesia PA

## 2019-08-28 NOTE — Progress Notes (Addendum)
Anesthesia Chart Review: SAME DAY WORK-UP    Case: [id number] Date/Time: 08/29/19 1020   Procedure: LEFT TRANSMETATARSAL AMPUTATION (Left )   Anesthesia type: Choice   Pre-op diagnosis: Osteomyelitis Left Foot   Location: [facility] OR ROOM 04 / [place] [facility]   Surgeons: [name], MD      DISCUSSION: Patient is a 79 year old male scheduled for the above procedure.  History includes never smoker, severe AS (s/p TAVR 04/11/16), chronic combined systolic and diastolic CHF, ischemic cardiomyopathy, exertional dyspnea (improved post-TAVR), CAD (s/p BMS RCA 03/10/16; s/p balloon angioplasty RCA for ISR 11/01/16; s/p DES RCA for ISR 04/27/17), PAF, sick sinus syndrome (s/p Medtronic PPM 04/13/16, upgraded to [employer] ICD Medtronic Model # O4399763 Serial # [id number] 11/07/17 for primary prevention due to ischemic cardiomyopathy), pulmonary hypertension, HTN (with history of orthostatic hypotension), hypercholesterolemia, OSA (CPAP), obesity hypoventilation syndrome, venous insufficiency, asthma, COPD, glaucoma, DM2, peripheral neuropathy, PAD (s/p great toe amputation), BPH, hiatal hernia, GERD, fatty liver/elevated transaminase (2017), thrombocytopenia, normocytic anemia, chronic pain syndrome, thoracic spinal cord stimulator.  08/28/19 preoperative COVID-19 test is in process. Anesthesia team to evaluate on the day of surgery. He is on Eliquis. He had a non-ischemic stress test, EF 48% last month. His last echo was in 2019 (TAVR 2017) and by notes showed EF of 40-45%, otherwise no additional echo findings documented. Fax and call request to [facility] for copy of echo report, but not yet received.    UPDATE 08/29/19 2:54 PM: Echo report just received from [facility]. See below.   VS:  Blood Pressure 148/82 08/15/2019 11:43 AM EST   Pulse 86 08/15/2019 11:43 AM EST   Weight 150.6 kg (332 lb) 08/15/2019 11:43 AM EST   Height 182.9 cm (6') 08/15/2019 11:43 AM EST   Body Mass Index 45.03 08/15/2019 11:43 AM  EST     PROVIDERS: Tammi Sou, MD his PCP Clovis Riley, MD is cardiologist ([facility]). Last visit 08/15/19 with Homero Fellers, NP following hospitalization for chest pain, ruled out ACS, with negative nuclear stress test. His PPM was upgraded by EP cardiologist Gabriel Carina, MD on 11/07/17. Tora Perches, MD is pulmonologist [facility], see [facility]). Last visit 07/30/18 with Augusto Gamble, PA-C.   LABS: Labs as of 08/13/19 include: Lab Results  Component Value Date   WBC 7.8 08/13/2019   HGB 13.0 08/13/2019   HCT 39.3 08/13/2019   PLT 170 08/13/2019   GLUCOSE 142 (H) 08/13/2019   ALT 20 08/13/2019   AST 25 08/13/2019   NA 138 08/13/2019   K 4.4 08/13/2019   CL 104 08/13/2019   CREATININE 0.76 08/13/2019   BUN 22 08/13/2019   CO2 22 08/13/2019   A1c 6.1% 03/27/19.   PFTs 07/30/18 ([facility]): FEV1  2.47 liters Final  Comment: 86%  FVC  3.57 liters Final  Comment: 80%  FEV1/FVC  69 % Final  Comment: 69%  DLCO 32.9 ml/mmHg sec Final  Comment: 108%  Impression: Mild obstruction. Please see official interpretation.    IMAGES: CTA Pulmonary 07/08/19 ([facility]): IMPRESSION: 1. No pulmonary embolism. 2. No acute findings. 3. Evaluation of small pulmonary vessels is not optimal due to respiratory motion artifact  CXR 07/08/19 ([facility]) FINDINGS: Left chest wall 3-lead AICD is grossly unchanged. Stable cardiac and mediastinal contours. No pleural effusion or pneumothorax. The lungs are clear. Prominent pulmonary vasculature centrally. Thoracic spinal cord stimulator. IMPRESSION: Mild vascular congrestion.   EKG: 07/08/19 [facility]): Sinus tachycardia at 113 bpm Right  superior axis deviation Nonspecific intraventricular block Cannot rule out Septal infarct , age undetermined Abnormal ECG When compared with ECG of 06-Nov-2017 12:28, Sinus rhythm has replaced Electronic ventricular pacemaker Vent. rate has increased BY 46 BPM Ciro Backer (1997) on 12/03/1306 6:57:84 PM certifies that he/she has reviewed the ECG tracing and confirms the independent interpretation is correct.   CV: Nuclear stress test 07/09/19 ([facility]): IMPRESSION: No evidence of inducible ischemia. Calculated EF is 48%.    Echo 04/18/18 ([facility]):  Summary: Study was technically difficult.  The study was technically limited. The left ventricle is normal in size. There is mild concentric left ventricular hypertrophy. There is moderate diffuse hypokinesis of the left ventricle. The left ventricular ejection fraction is moderately reduced (40-45%). Grade 1 mild diastolic dysfunction; abnormal relaxation pattern. There is a defibrillator lead in the right ventricle. The left atrium is mildly dilated. Bioprosthetic valve in the aortic position appears well-seated and functions normally. Prosthetic aortic valve peak and/or mean gradients are normal.   Cardiac cath 04/27/17 ([facility]): PROCEDURES PERFORMED:  1. Coronary angiography 2. Percutaneous coronary intervention of RCA FINDINGS: Coronary Angiography 1. Left Main - Normal 2. Left anterior descending artery - 25% mid 3. Diagonals - 50% proximal 4. Left Circumflex - Normal 5. Obtuse Marginals - Normal 6. Right Coronary Artery - 99% proximal in-stent restenosis, 50% mid, 50% distal 7. Posterior Descending Artery - Normal Additional comments on angiography: Right Dominance Hemodynamics 1. Aortic Pressure - 105/60 mmHg CONCLUSIONS:  1. Successful transradial cardiac catheterization 2. Obstructive one-vessel coronary artery disease involving the RCA with in-stent restenosis of the bare-metal stent, status post drug-eluting stent implantation with lesion reduction from 99% to 0% RECOMMENDATIONS: The patient will need to remain on plavix and eliquis at discharge.   Carotid US 03/08/16 ([facility]): IMPRESSION: 1. No hemodynamically significant stenosis on either side.  2. Both vertebral  arteries are patent with antegrade flow.   RHC (pre-TAVR) 03/07/16 ([facility]) Hemodynamic data AT REST: 1. Right atrial pressure mean of 5. 2. Right ventricular pressure 30/6. 3. Pulmonary artery pressure 32/15 with a mean of 20. 4. Pulmonary capillary wedge pressure 8. 5. Left ventricular pressure 151/15. 6. Aortic pressure 105/60.  7. Pulmonary artery saturation 70.2%. 8. Aortic saturation 94.7%. 9. Calculated cardiac output of 6.8 L/minute by Fick with an index of 2.5 L/minute/m2. 10. Aortic valve area 0.9 cm2. 11. Mean aortic valve gradient of 43 mmHg. 12. Comments on hemodynamic date - None    Past Medical History:  Diagnosis Date  . AICD (automatic cardioverter/defibrillator) present   . ASTHMA   . BPH (benign prostatic hypertrophy)   . CAD (coronary artery disease)    Nonobstructive by 12/2012 cath; then 03/2016 he required BMS to RCA ([facility]).  In-stent restenosis on cath 11/01/16, baloon angioplasty successful--pt to take plavix (no ASA), + eliquis (for PAF).  [name] CATARACT, HX OF   . Chronic combined systolic and diastolic CHF (congestive heart failure) ([place]) 05/2016   Ischemic CM.  04/2018 EF 40-45%, grd I DD.  [name] Chronic pain syndrome    Lumbar DDD; chronic neuropathic pain (DM); has spinal stimulator and sees pain mgmt MD  . COPD   . Diabetes mellitus type 2 with complications (HCC)    ONG2X jumped from 5.7% to 6.1% 03/2015---started metformin at that time.  DM 2 dx by fasting gluc criteria 2018.  Has chronic neuropath pain  . Elevated transaminase level 02/2016   Suspect due to fatty liver (documented on u/s 2007).  Plan repeat labs 03/2016.  [name] Essential hypertension, benign   . Fatty liver 2007   2007 u/s showed fatty liver with hepatosplenomegaly.  2019 repeat u/s->fatty liver but no cirrhosis or hepatosplenomegaly.  [name] GERD (gastroesophageal reflux disease)   . Glaucoma   . GOUT   . Hallux valgus (acquired)   . HH (hiatus hernia)   . HYPERCHOLESTEROLEMIA-PURE   .  Hypogonadism male   . Lumbosacral neuritis   . Lumbosacral spondylosis    Lumbar spinal stenosis with neurogenic claudication--contributes to his chronic pain syndrome  . Normal memory function 08/2014   Neuropsychological testing ([facility]): no cognitive impairment or sign of neurodegenerative disorder.  Likely has adjustment d/o with mixed anxiety/depressed features and may benefit from low dose SNRI.    [name] Normocytic anemia 03/2016   Mild-pt needs ferritin and vit B12 level checked (as of 03/22/16)  . NSTEMI (non-ST elevated myocardial infarction) ([place]) 03/20/2016   BMS to RCA  . Obesity hypoventilation syndrome ([place])   . Obesity, unspecified   . Orthostatic hypotension   . OSA on CPAP    8 cm H2O  . OSTEOARTHRITIS   . Paroxysmal atrial fibrillation ([place]) 2003    (? chronic?) Off anticoag for a while due to falls.  Then apixaban started 12/2014.  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 Ella Bodo): neuropathic pain->diff to treat, failed nucynta, failed spinal stimul trial, oxycontin hs + tramadol + gabap as of 12/2017 f/u Dr. Letta Pate.  . Personal history of colonic adenoma 10/30/2012   Diminutive adenoma, consider repeat 2019 per GI  . Presence of cardiac defibrillator 11/07/2017  . Primary osteoarthritis of both knees    Bone on bone of medial compartments, + signif patellofemoral arth bilat.--supartz inj series started 09/12/17  . PUD (peptic ulcer disease)   . PULMONARY HYPERTENSION, HX OF   . Secondary male hypogonadism 2017  . 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 Shortness of breath    with exertion: much improved s/p TAVR and treatment for CHF.  . Sick sinus syndrome (HCC)    PPM placed  . Thrombocytopenia ([place]) 2018   HSM on 2007 abd u/s---suspect some mild splenic sequestration chronically.  [name] Unspecified glaucoma(365.9)   . Unspecified hereditary and  idiopathic peripheral neuropathy approx age 21   bilat LE's, ? left arm, too.  Feet became progressively numb + left foot pain intermittently.  Pt may be trying a spinal stimulator (as of 05/2015)  . VENOUS INSUFFICIENCY    Being followed by Dr. Sharol Given as of 10/2016 for two R LL venous stasis ulcers/skin tears.  Healed as of 10/30/16 f/u with Dr. Sharol Given.  . VENTRAL HERNIA     Past Surgical History:  Procedure Laterality Date  . AMPUTATION Left 04/11/2013   Procedure: AMPUTATION DIGIT Left 3rd toe;  Surgeon: [name], MD;  Location: [place];  Service: Orthopedics;  Laterality: Left;  Left 3rd toe amputation at [facility]  . CARDIAC CATHETERIZATION  1997; 03/10/16   1997 Non-obstructive disease.  03/2016 BMS to RCA, with 25% pDiag dz, o/w normal cors per cath 03/07/16.  Cath 11/01/16: in stent restenosis, successful baloon angioplasty.  [name] CARDIAC CATHETERIZATION  12/24/2012   mild < 20% LCx, prox 30% RCA; LVEF 55-65% , moderate pulmonary HTN, moderate AS  . CARDIAC DEFIBRILLATOR PLACEMENT  11/07/2017   [place] MRI Quad CRT defibrillator  . CARDIOVASCULAR STRESS  TEST  05/11/16 ([facility])   Myocardial perfusion imaging:  No ischemia; scar in apex, global hypokinesis, EF 36%.  . Carotid dopplers  03/09/2016   [facility]: no hemodynamically significant stenosis on either side.  . CHOLECYSTECTOMY    . COLONOSCOPY    . COLONOSCOPY [name] 10/30/2012   Procedure: COLONOSCOPY;  Surgeon: [name], MD;  Location: [facility];  Service: Endoscopy;  Laterality: [name];  . CORONARY ANGIOPLASTY WITH STENT PLACEMENT  03/2016; 04/2017   2017-[facility]: BMS to RCA-pt was placed on Brilinta.  04/2017: DES to RCA.  [name] EYE SURGERY Bilateral cataract  . HEMORRHOID SURGERY    . INTRAOCULAR LENS INSERTION Bilateral   . KNEE SURGERY Right   . LEFT AND RIGHT HEART CATHETERIZATION WITH CORONARY ANGIOGRAM [name] 12/24/2012   Procedure: LEFT AND RIGHT HEART CATHETERIZATION WITH CORONARY ANGIOGRAM;  Surgeon: [name], MD;  Location: [facility] C[facility]  [facility];  Service: Cardiovascular;  Laterality: [name];  . LEG SURGERY Bilateral    lenghtening   . PACEMAKER PLACEMENT  04/13/2016   2nd deg HB after TAVR, pt had DC MDT PPM placed.  [name] SHOULDER ARTHROSCOPY  08/30/2011   Procedure: ARTHROSCOPY SHOULDER;  Surgeon: [name], MD;  Location: [place];  Service: Orthopedics;  Laterality: Right;  Right Shoulder Arthroscopy, Debridement, and Decompression  . SPINAL CORD STIMULATOR INSERTION [name] 09/10/2015   Procedure: LUMBAR SPINAL CORD STIMULATOR INSERTION;  Surgeon: [name], MD;  Location: [place] [facility];  Service: Neurosurgery;  Laterality: [name];  . 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  R big toe surg due to osteoarth  . TONSILLECTOMY    . traeculectomy Left    eye  . TRANSCATHETER AORTIC VALVE REPLACEMENT, TRANSFEMORAL  04/11/2016  . TRANSESOPHAGEAL ECHOCARDIOGRAM  03/09/2016   [facility]: EF 55-60%, PFO seen with bi-directional shunting, no thrombus in appendage.  . TRANSTHORACIC ECHOCARDIOGRAM  01/2015; 01/2016; 05/18/16; 09/18/16, 05/2017, 08/2017   01/2015 No signif change in aortic stenosis (moderate).  01/2016 Severe LVH w/small LV cavity, EF 60-65%, grade I diast dysfxn.  05/2016 (s/p TAVR): EF 50-55%, grd I DD, biopros AV good.  08/2016--EF 50-55%, LV septal motion c/w conduction abnl, grd I DD,mild MS,bioprosth aortic valve well seated, w/trace AR. 05/2017 TTE EF 35%. 08/2017-EF 35%, mod diff hypokin LV, grd I DD, biopros AV good.   . TRANSTHORACIC ECHOCARDIOGRAM     04/2018: EF 40-45%, mod diffuse LV hypokinesis, grd I DD, bioprosth AV well seated, no AS or AR.  [name] VITRECTOMY      MEDICATIONS: No current facility-administered medications for this encounter.   [name] albuterol (PROVENTIL HFA;VENTOLIN HFA) 108 (90 BASE) MCG/ACT inhaler  . allopurinol (ZYLOPRIM) 300 MG tablet  . amoxicillin-clavulanate (AUGMENTIN) 875-125 MG tablet  . apixaban (ELIQUIS) 5 MG TABS tablet  . ASPIRIN LOW DOSE 81 MG EC tablet  . B Complex-C (B-COMPLEX WITH VITAMIN  C) tablet  . cadexomer iodine (IODOSORB) 0.9 % gel  . clotrimazole-betamethasone (LOTRISONE) cream  . cyclobenzaprine (FLEXERIL) 5 MG tablet  . doxycycline (VIBRAMYCIN) 100 MG capsule  . ezetimibe (ZETIA) 10 MG tablet  . finasteride (PROSCAR) 5 MG tablet  . fluticasone (FLONASE) 50 MCG/ACT nasal spray  . Fluticasone-Salmeterol (ADVAIR DISKUS) 250-50 MCG/DOSE AEPB  . gabapentin (NEURONTIN) 800 MG tablet  . isosorbide mononitrate (IMDUR) 60 MG 24 hr tablet  . KLOR-CON M20 20 MEQ tablet  . metoprolol succinate (TOPROL-XL) 25 MG 24 hr tablet  . metoprolol succinate (TOPROL-XL) 50 MG 24 hr tablet  . Multiple Vitamin (MULTIVITAMIN)  capsule  . mupirocin ointment (BACTROBAN) 2 %  . niacin (NIASPAN) 1000 MG CR tablet  . nitroGLYCERIN (NITROSTAT) 0.4 MG SL tablet  . omeprazole (PRILOSEC) 10 MG capsule  . rosuvastatin (CRESTOR) 40 MG tablet  . sacubitril-valsartan (ENTRESTO) 24-26 MG  . torsemide (DEMADEX) 20 MG tablet  . VITAMIN D, CHOLECALCIFEROL, PO    Myra Gianotti, PA-C Surgical Short Stay/Anesthesiology [facility] Phone [phone] [facility] Phone [phone] 08/28/2019 4:53 PM

## 2019-08-28 NOTE — Telephone Encounter (Signed)
Pt was due for visit last month but is having surgery tomorrow, okay to fill medication?

## 2019-08-29 ENCOUNTER — Ambulatory Visit (HOSPITAL_COMMUNITY)
Admission: RE | Admit: 2019-08-29 | Discharge: 2019-08-29 | Disposition: A | Discharge status: Home / Self Care | Payer: Medicare Other | Attending: Orthopedic Surgery | Admitting: Orthopedic Surgery

## 2019-08-29 ENCOUNTER — Encounter (HOSPITAL_COMMUNITY): Payer: Self-pay | Admitting: Orthopedic Surgery

## 2019-08-29 ENCOUNTER — Encounter (HOSPITAL_COMMUNITY)
Admission: RE | Disposition: A | Discharge status: Home / Self Care | Payer: Self-pay | Source: Home / Self Care | Attending: Orthopedic Surgery

## 2019-08-29 ENCOUNTER — Ambulatory Visit (HOSPITAL_COMMUNITY): Payer: Medicare Other | Admitting: Vascular Surgery

## 2019-08-29 ENCOUNTER — Other Ambulatory Visit: Payer: Self-pay

## 2019-08-29 DIAGNOSIS — M109 Gout, unspecified: Secondary | ICD-10-CM | POA: Diagnosis not present

## 2019-08-29 DIAGNOSIS — Z6841 Body Mass Index (BMI) 40.0 and over, adult: Secondary | ICD-10-CM | POA: Diagnosis not present

## 2019-08-29 DIAGNOSIS — H42 Glaucoma in diseases classified elsewhere: Secondary | ICD-10-CM | POA: Diagnosis not present

## 2019-08-29 DIAGNOSIS — I480 Paroxysmal atrial fibrillation: Secondary | ICD-10-CM | POA: Diagnosis not present

## 2019-08-29 DIAGNOSIS — M86272 Subacute osteomyelitis, left ankle and foot: Secondary | ICD-10-CM | POA: Diagnosis not present

## 2019-08-29 DIAGNOSIS — Z881 Allergy status to other antibiotic agents status: Secondary | ICD-10-CM | POA: Insufficient documentation

## 2019-08-29 DIAGNOSIS — E1169 Type 2 diabetes mellitus with other specified complication: Secondary | ICD-10-CM | POA: Insufficient documentation

## 2019-08-29 DIAGNOSIS — Z95810 Presence of automatic (implantable) cardiac defibrillator: Secondary | ICD-10-CM | POA: Diagnosis not present

## 2019-08-29 DIAGNOSIS — M869 Osteomyelitis, unspecified: Secondary | ICD-10-CM | POA: Diagnosis not present

## 2019-08-29 DIAGNOSIS — G894 Chronic pain syndrome: Secondary | ICD-10-CM | POA: Diagnosis not present

## 2019-08-29 DIAGNOSIS — E662 Morbid (severe) obesity with alveolar hypoventilation: Secondary | ICD-10-CM | POA: Diagnosis not present

## 2019-08-29 DIAGNOSIS — Z882 Allergy status to sulfonamides status: Secondary | ICD-10-CM | POA: Insufficient documentation

## 2019-08-29 DIAGNOSIS — I5042 Chronic combined systolic (congestive) and diastolic (congestive) heart failure: Secondary | ICD-10-CM | POA: Insufficient documentation

## 2019-08-29 DIAGNOSIS — J449 Chronic obstructive pulmonary disease, unspecified: Secondary | ICD-10-CM | POA: Diagnosis not present

## 2019-08-29 DIAGNOSIS — Z888 Allergy status to other drugs, medicaments and biological substances status: Secondary | ICD-10-CM | POA: Insufficient documentation

## 2019-08-29 DIAGNOSIS — M170 Bilateral primary osteoarthritis of knee: Secondary | ICD-10-CM | POA: Insufficient documentation

## 2019-08-29 DIAGNOSIS — Z955 Presence of coronary angioplasty implant and graft: Secondary | ICD-10-CM | POA: Insufficient documentation

## 2019-08-29 DIAGNOSIS — Z89412 Acquired absence of left great toe: Secondary | ICD-10-CM | POA: Diagnosis not present

## 2019-08-29 DIAGNOSIS — I2510 Atherosclerotic heart disease of native coronary artery without angina pectoris: Secondary | ICD-10-CM | POA: Diagnosis not present

## 2019-08-29 DIAGNOSIS — I110 Hypertensive heart disease with heart failure: Secondary | ICD-10-CM | POA: Insufficient documentation

## 2019-08-29 DIAGNOSIS — I872 Venous insufficiency (chronic) (peripheral): Secondary | ICD-10-CM | POA: Insufficient documentation

## 2019-08-29 DIAGNOSIS — E1139 Type 2 diabetes mellitus with other diabetic ophthalmic complication: Secondary | ICD-10-CM | POA: Diagnosis not present

## 2019-08-29 DIAGNOSIS — K219 Gastro-esophageal reflux disease without esophagitis: Secondary | ICD-10-CM | POA: Insufficient documentation

## 2019-08-29 DIAGNOSIS — Z885 Allergy status to narcotic agent status: Secondary | ICD-10-CM | POA: Diagnosis not present

## 2019-08-29 DIAGNOSIS — E1142 Type 2 diabetes mellitus with diabetic polyneuropathy: Secondary | ICD-10-CM | POA: Diagnosis not present

## 2019-08-29 DIAGNOSIS — Z952 Presence of prosthetic heart valve: Secondary | ICD-10-CM | POA: Insufficient documentation

## 2019-08-29 DIAGNOSIS — I252 Old myocardial infarction: Secondary | ICD-10-CM | POA: Diagnosis not present

## 2019-08-29 DIAGNOSIS — N400 Enlarged prostate without lower urinary tract symptoms: Secondary | ICD-10-CM | POA: Insufficient documentation

## 2019-08-29 DIAGNOSIS — E7800 Pure hypercholesterolemia, unspecified: Secondary | ICD-10-CM | POA: Diagnosis not present

## 2019-08-29 HISTORY — DX: Presence of automatic (implantable) cardiac defibrillator: Z95.810

## 2019-08-29 HISTORY — DX: Pneumonia, unspecified organism: J18.9

## 2019-08-29 HISTORY — PX: AMPUTATION: SHX166

## 2019-08-29 HISTORY — DX: Unspecified asthma, uncomplicated: J45.909

## 2019-08-29 LAB — COMPREHENSIVE METABOLIC PANEL
ALT: 26 U/L (ref 0–44)
AST: 31 U/L (ref 15–41)
Albumin: 3.5 g/dL (ref 3.5–5.0)
Alkaline Phosphatase: 65 U/L (ref 38–126)
Anion gap: 12 (ref 5–15)
BUN: 20 mg/dL (ref 8–23)
CO2: 24 mmol/L (ref 22–32)
Calcium: 8.9 mg/dL (ref 8.9–10.3)
Chloride: 99 mmol/L (ref 98–111)
Creatinine, Ser: 1.07 mg/dL (ref 0.61–1.24)
GFR calc Af Amer: >60 mL/min (ref >60)
GFR calc non Af Amer: >60 mL/min (ref >60)
Glucose, Bld: 138 mg/dL — ABNORMAL HIGH (ref 70–99)
Potassium: 4 mmol/L (ref 3.5–5.1)
Sodium: 135 mmol/L (ref 135–145)
Total Bilirubin: 1.1 mg/dL (ref 0.3–1.2)
Total Protein: 6.7 g/dL (ref 6.5–8.1)

## 2019-08-29 LAB — PROTIME-INR
INR: 1.6 — ABNORMAL HIGH (ref 0.8–1.2)
Prothrombin Time: 19 seconds — ABNORMAL HIGH (ref 11.4–15.2)

## 2019-08-29 LAB — CBC
HCT: 38.5 % — ABNORMAL LOW (ref 39.0–52.0)
Hemoglobin: 12.3 g/dL — ABNORMAL LOW (ref 13.0–17.0)
MCH: 30.4 pg (ref 26.0–34.0)
MCHC: 31.9 g/dL (ref 30.0–36.0)
MCV: 95.3 fL (ref 80.0–100.0)
Platelets: 134 10*3/uL — ABNORMAL LOW (ref 150–400)
RBC: 4.04 MIL/uL — ABNORMAL LOW (ref 4.22–5.81)
RDW: 13.9 % (ref 11.5–15.5)
WBC: 11 10*3/uL — ABNORMAL HIGH (ref 4.0–10.5)
nRBC: 0 % (ref 0.0–0.2)

## 2019-08-29 LAB — GLUCOSE, CAPILLARY
Glucose-Capillary: 125 mg/dL — ABNORMAL HIGH (ref 70–99)
Glucose-Capillary: 139 mg/dL — ABNORMAL HIGH (ref 70–99)
Glucose-Capillary: 146 mg/dL — ABNORMAL HIGH (ref 70–99)

## 2019-08-29 SURGERY — AMPUTATION, FOOT, RAY
Anesthesia: Monitor Anesthesia Care | Laterality: Left

## 2019-08-29 MED ORDER — PROPOFOL 500 MG/50ML IV EMUL
INTRAVENOUS | Status: DC | PRN
  Administered 2019-08-29: 50 ug/kg/min via INTRAVENOUS

## 2019-08-29 MED ORDER — EPHEDRINE 5 MG/ML INJ
INTRAVENOUS | Status: AC
  Filled 2019-08-29: qty 20

## 2019-08-29 MED ORDER — MIDAZOLAM HCL 2 MG/2ML IJ SOLN: 1.0000 mg | Freq: Once | INTRAMUSCULAR | Status: AC

## 2019-08-29 MED ORDER — 0.9 % SODIUM CHLORIDE (POUR BTL) OPTIME
TOPICAL | Status: DC | PRN
  Administered 2019-08-29: 1000 mL

## 2019-08-29 MED ORDER — CHLORHEXIDINE GLUCONATE 4 % EX LIQD: 60.0000 mL | Freq: Once | CUTANEOUS | Status: DC

## 2019-08-29 MED ORDER — OXYCODONE-ACETAMINOPHEN 5-325 MG PO TABS: 1.0000 | ORAL_TABLET | ORAL | 0 refills | Status: DC | PRN

## 2019-08-29 MED ORDER — ROPIVACAINE HCL 5 MG/ML IJ SOLN
INTRAMUSCULAR | Status: DC | PRN
  Administered 2019-08-29: 30 mL via PERINEURAL

## 2019-08-29 MED ORDER — FENTANYL CITRATE (PF) 250 MCG/5ML IJ SOLN
INTRAMUSCULAR | Status: DC | PRN
  Administered 2019-08-29: 50 ug via INTRAVENOUS

## 2019-08-29 MED ORDER — ACETAMINOPHEN 500 MG PO TABS
1000.0000 mg | ORAL_TABLET | Freq: Once | ORAL | Status: AC
  Administered 2019-08-29: 11:00:00 1000 mg via ORAL
  Filled 2019-08-29: qty 2

## 2019-08-29 MED ORDER — LACTATED RINGERS IV SOLN: INTRAVENOUS | Status: DC

## 2019-08-29 MED ORDER — EPHEDRINE 5 MG/ML INJ
INTRAVENOUS | Status: AC
  Filled 2019-08-29: qty 10

## 2019-08-29 MED ORDER — DEXAMETHASONE SODIUM PHOSPHATE 10 MG/ML IJ SOLN
INTRAMUSCULAR | Status: DC | PRN
  Administered 2019-08-29: 5 mg via INTRAVENOUS

## 2019-08-29 MED ORDER — FENTANYL CITRATE (PF) 250 MCG/5ML IJ SOLN
INTRAMUSCULAR | Status: AC
  Filled 2019-08-29: qty 5

## 2019-08-29 MED ORDER — DEXTROSE 5 % IV SOLN
3.0000 g | INTRAVENOUS | Status: AC
  Administered 2019-08-29: 3 g via INTRAVENOUS
  Filled 2019-08-29 (×2): qty 3000

## 2019-08-29 MED ORDER — ONDANSETRON HCL 4 MG/2ML IJ SOLN
INTRAMUSCULAR | Status: DC | PRN
  Administered 2019-08-29: 4 mg via INTRAVENOUS

## 2019-08-29 MED ORDER — DEXAMETHASONE SODIUM PHOSPHATE 10 MG/ML IJ SOLN
INTRAMUSCULAR | Status: DC | PRN
  Administered 2019-08-29: 5 mg

## 2019-08-29 MED ORDER — FENTANYL CITRATE (PF) 100 MCG/2ML IJ SOLN: 25.0000 ug | Freq: Once | INTRAMUSCULAR | Status: AC

## 2019-08-29 MED ORDER — FENTANYL CITRATE (PF) 100 MCG/2ML IJ SOLN
INTRAMUSCULAR | Status: AC
  Administered 2019-08-29: 25 ug via INTRAVENOUS
  Filled 2019-08-29: qty 2

## 2019-08-29 MED ORDER — FENTANYL CITRATE (PF) 100 MCG/2ML IJ SOLN: 25.0000 ug | INTRAMUSCULAR | Status: DC | PRN

## 2019-08-29 MED ORDER — PROPOFOL 10 MG/ML IV BOLUS
INTRAVENOUS | Status: DC | PRN
  Administered 2019-08-29: 20 mg via INTRAVENOUS
  Administered 2019-08-29: 30 mg via INTRAVENOUS

## 2019-08-29 MED ORDER — MIDAZOLAM HCL 2 MG/2ML IJ SOLN
INTRAMUSCULAR | Status: AC
  Administered 2019-08-29: 1 mg via INTRAVENOUS
  Filled 2019-08-29: qty 2

## 2019-08-29 SURGICAL SUPPLY — 34 items
BLADE SAW SGTL MED 73X18.5 STR (BLADE) IMPLANT
BLADE SURG 21 STRL SS (BLADE) ×2 IMPLANT
BNDG COHESIVE 4X5 TAN STRL (GAUZE/BANDAGES/DRESSINGS) ×1 IMPLANT
BNDG COHESIVE 6X5 TAN NS LF (GAUZE/BANDAGES/DRESSINGS) ×1 IMPLANT
BNDG COHESIVE 6X5 TAN STRL LF (GAUZE/BANDAGES/DRESSINGS) ×1 IMPLANT
BNDG GAUZE ELAST 4 BULKY (GAUZE/BANDAGES/DRESSINGS) ×1 IMPLANT
COVER SURGICAL LIGHT HANDLE (MISCELLANEOUS) ×3 IMPLANT
COVER WAND RF STERILE (DRAPES) ×1 IMPLANT
DRAPE INCISE IOBAN 66X45 STRL (DRAPES) ×1 IMPLANT
DRAPE U-SHAPE 47X51 STRL (DRAPES) ×3 IMPLANT
DRSG ADAPTIC 3X8 NADH LF (GAUZE/BANDAGES/DRESSINGS) ×1 IMPLANT
DRSG PAD ABDOMINAL 8X10 ST (GAUZE/BANDAGES/DRESSINGS) ×2 IMPLANT
DURAPREP 26ML APPLICATOR (WOUND CARE) ×2 IMPLANT
ELECT REM PT RETURN 9FT ADLT (ELECTROSURGICAL) ×2
ELECTRODE REM PT RTRN 9FT ADLT (ELECTROSURGICAL) ×1 IMPLANT
GAUZE SPONGE 4X4 12PLY STRL (GAUZE/BANDAGES/DRESSINGS) ×1 IMPLANT
GLOVE BIOGEL PI IND STRL 9 (GLOVE) ×1 IMPLANT
GLOVE BIOGEL PI INDICATOR 9 (GLOVE) ×1
GLOVE SURG ORTHO 9.0 STRL STRW (GLOVE) ×2 IMPLANT
GOWN STRL REUS W/ TWL XL LVL3 (GOWN DISPOSABLE) ×2 IMPLANT
GOWN STRL REUS W/TWL XL LVL3 (GOWN DISPOSABLE) ×4
KIT BASIN OR (CUSTOM PROCEDURE TRAY) ×2 IMPLANT
KIT DRSG PREVENA PLUS 7DAY 125 (MISCELLANEOUS) ×1 IMPLANT
KIT PREVENA INCISION MGT 13 (CANNISTER) ×1 IMPLANT
KIT TURNOVER KIT B (KITS) ×2 IMPLANT
NS IRRIG 1000ML POUR BTL (IV SOLUTION) ×2 IMPLANT
PACK ORTHO EXTREMITY (CUSTOM PROCEDURE TRAY) ×2 IMPLANT
PAD ARMBOARD 7.5X6 YLW CONV (MISCELLANEOUS) ×4 IMPLANT
SPONGE LAP 18X18 RF (DISPOSABLE) ×1 IMPLANT
STOCKINETTE IMPERVIOUS LG (DRAPES) IMPLANT
SUT ETHILON 2 0 PSLX (SUTURE) ×4 IMPLANT
TOWEL GREEN STERILE (TOWEL DISPOSABLE) ×2 IMPLANT
TUBE CONNECTING 12X1/4 (SUCTIONS) ×2 IMPLANT
YANKAUER SUCT BULB TIP NO VENT (SUCTIONS) ×2 IMPLANT

## 2019-08-29 NOTE — Op Note (Signed)
08/29/2019  1:37 PM  PATIENT:  Tim [name]    PRE-OPERATIVE DIAGNOSIS:  Osteomyelitis Left Foot  POST-OPERATIVE DIAGNOSIS:  Same  PROCEDURE:  LEFT TRANSMETATARSAL AMPUTATION Application of Prevena wound VAC 13 cm  SURGEON:  [name], MD  PHYSICIAN ASSISTANT:None ANESTHESIA:   General  PREOPERATIVE INDICATIONS:  [name] is a  79 y.o. male with a diagnosis of Osteomyelitis Left Foot who failed conservative measures and elected for surgical management.    The risks benefits and alternatives were discussed with the patient preoperatively including but not limited to the risks of infection, bleeding, nerve injury, cardiopulmonary complications, the need for revision surgery, among others, and the patient was willing to proceed.  OPERATIVE IMPLANTS: Praveena wound VAC 13 cm  @ENCIMAGES @  OPERATIVE FINDINGS: Good petechial bleeding no infection at the level of amputation  OPERATIVE PROCEDURE: Patient was brought the operating room after undergoing a regional block after adequate levels anesthesia were obtained patient's left lower extremity was prepped using DuraPrep draped into a sterile field a timeout was called.  A fishmouth incision was made just proximal to the ulcerative tissue.  An oscillating saw was used to perform a transmetatarsal amputation.  Electrocautery was used for hemostasis the blood vessels were calcified.  The wound was irrigated with normal saline.  The incision was closed using 2-0 nylon.  A Prevena wound VAC was applied this had a good suction fit patient's leg was wrapped with compression Coban for the venous insufficiency.  Patient was taken the PACU in stable condition.   DISCHARGE PLANNING:  Antibiotic duration: Preoperative antibiotics  Weightbearing: Touchdown weightbearing on the left  Pain medication: Prescription for Percocet  Dressing care/ Wound VAC: Continue wound VAC for 1 week  Ambulatory devices: Walker or crutches  Discharge  to: Home.  Follow-up: In the office 1 week post operative.

## 2019-08-29 NOTE — Anesthesia Procedure Notes (Signed)
Anesthesia Regional Block: Popliteal block   Pre-Anesthetic Checklist: ,, timeout performed, Correct Patient, Correct Site, Correct Laterality, Correct Procedure, Correct Position, site marked, Risks and benefits discussed,  Surgical consent,  Pre-op evaluation,  At surgeon's request and post-op pain management  Laterality: Left  Prep: Maximum Sterile Barrier Precautions used, chloraprep       Needles:  Injection technique: Single-shot  Needle Type: Echogenic Stimulator Needle     Needle Length: 9cm  Needle Gauge: 22     Additional Needles:   Procedures:,,,, ultrasound used (permanent image in chart),,,,  Narrative:  Start time: 08/29/2019 11:03 AM End time: 08/29/2019 11:13 AM Injection made incrementally with aspirations every 5 mL.  Performed by: Personally  Anesthesiologist: Freddrick March, MD  Additional Notes: Monitors applied. No increased pain on injection. No increased resistance to injection. Injection made in 5cc increments. Good needle visualization. Patient tolerated procedure well.

## 2019-08-29 NOTE — Progress Notes (Signed)
Orthopedic Tech Progress Note Patient Details:  [name] [birth year] [id number]  Ortho Devices Type of Ortho Device: Postop shoe/boot       [place] Pink 08/29/2019, 2:21 PM

## 2019-08-29 NOTE — H&P (Signed)
Tim [name] is an 79 y.o. male.   Chief Complaint: Left Foot osteomyelitis HPI:  Patient is a 79 year old gentleman who was seen for ulceration beneath the second metatarsal head left foot.  Patient complains of odor and drainage.  He is status post a great toe amputation.   Past Medical History:  Diagnosis Date  . AICD (automatic cardioverter/defibrillator) present   . ASTHMA   . Asthma    as a child  . BPH (benign prostatic hypertrophy)   . CAD (coronary artery disease)    Nonobstructive by 12/2012 cath; then 03/2016 he required BMS to RCA ([facility]).  In-stent restenosis on cath 11/01/16, baloon angioplasty successful--pt to take plavix (no ASA), + eliquis (for PAF).  [name] CATARACT, HX OF   . Chronic combined systolic and diastolic CHF (congestive heart failure) ([place]) 05/2016   Ischemic CM.  04/2018 EF 40-45%, grd I DD.  [name] Chronic pain syndrome    Lumbar DDD; chronic neuropathic pain (DM); has spinal stimulator and sees pain mgmt MD  . COPD   . Diabetes mellitus type 2 with complications (HCC)    [id number] jumped from 5.7% to 6.1% 03/2015---started metformin at that time.  DM 2 dx by fasting gluc criteria 2018.  Has chronic neuropath pain  . Elevated transaminase level 02/2016   Suspect due to fatty liver (documented on u/s 2007). Plan repeat labs 03/2016.  [name] Essential hypertension, benign   . Fatty liver 2007   2007 u/s showed fatty liver with hepatosplenomegaly.  2019 repeat u/s->fatty liver but no cirrhosis or hepatosplenomegaly.  [name] GERD (gastroesophageal reflux disease)   . Glaucoma   . GOUT   . Hallux valgus (acquired)   . HH (hiatus hernia)   . HYPERCHOLESTEROLEMIA-PURE   . Hypogonadism male   . Lumbosacral neuritis   . Lumbosacral spondylosis    Lumbar spinal stenosis with neurogenic claudication--contributes to his chronic pain syndrome  . Normal memory function 08/2014   Neuropsychological testing ([facility]): no cognitive impairment or sign of  neurodegenerative disorder.  Likely has adjustment d/o with mixed anxiety/depressed features and may benefit from low dose SNRI.    [name] Normocytic anemia 03/2016   Mild-pt needs ferritin and vit B12 level checked (as of 03/22/16)  . NSTEMI (non-ST elevated myocardial infarction) ([place]) 03/20/2016   BMS to RCA  . Obesity hypoventilation syndrome ([place])   . Obesity, unspecified   . Orthostatic hypotension   . OSA on CPAP    8 cm H2O  . OSTEOARTHRITIS   . Paroxysmal atrial fibrillation ([place]) 2003    (? chronic?) Off anticoag for a while due to falls.  Then apixaban started 12/2014.  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 Ella Bodo): neuropathic pain->diff to treat, failed nucynta, failed spinal stimul trial, oxycontin hs + tramadol + gabap as of 12/2017 f/u Dr. Letta Pate.  . Personal history of colonic adenoma 10/30/2012   Diminutive adenoma, consider repeat 2019 per GI  . Pneumonia   . Presence of cardiac defibrillator 11/07/2017  . Primary osteoarthritis of both knees    Bone on bone of medial compartments, + signif patellofemoral arth bilat.--supartz inj series started 09/12/17  . PUD (peptic ulcer disease)   . PULMONARY HYPERTENSION, HX OF   . Secondary male hypogonadism 2017  . 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 Shortness of breath    with exertion: much improved s/p TAVR  and treatment for CHF.  . Sick sinus syndrome (HCC)    PPM placed  . Thrombocytopenia ([place]) 2018   HSM on 2007 abd u/s---suspect some mild splenic sequestration chronically.  [name] Unspecified glaucoma(365.9)   . Unspecified hereditary and idiopathic peripheral neuropathy approx age 82   bilat LE's, ? left arm, too.  Feet became progressively numb + left foot pain intermittently.  Pt may be trying a spinal stimulator (as of 05/2015)  . VENOUS INSUFFICIENCY    Being followed by Dr. Sharol Given as of 10/2016 for two R LL  venous stasis ulcers/skin tears.  Healed as of 10/30/16 f/u with Dr. Sharol Given.  . VENTRAL HERNIA     Past Surgical History:  Procedure Laterality Date  . AMPUTATION Left 04/11/2013   Procedure: AMPUTATION DIGIT Left 3rd toe;  Surgeon: [name], MD;  Location: [place];  Service: Orthopedics;  Laterality: Left;  Left 3rd toe amputation at [facility]  . CARDIAC CATHETERIZATION  1997; 03/10/16   1997 Non-obstructive disease.  03/2016 BMS to RCA, with 25% pDiag dz, o/w normal cors per cath 03/07/16.  Cath 11/01/16: in stent restenosis, successful baloon angioplasty.  [name] CARDIAC CATHETERIZATION  12/24/2012   mild < 20% LCx, prox 30% RCA; LVEF 55-65% , moderate pulmonary HTN, moderate AS  . CARDIAC DEFIBRILLATOR PLACEMENT  11/07/2017   [facility] MRI Quad CRT defibrillator  . CARDIOVASCULAR STRESS TEST  05/11/16 ([facility])   Myocardial perfusion imaging:  No ischemia; scar in apex, global hypokinesis, EF 36%.  . Carotid dopplers  03/09/2016   [facility]: no hemodynamically significant stenosis on either side.  . CHOLECYSTECTOMY    . COLONOSCOPY    . COLONOSCOPY [name] 10/30/2012   Procedure: COLONOSCOPY;  Surgeon: [name], MD;  Location: [facility];  Service: Endoscopy;  Laterality: [name];  . CORONARY ANGIOPLASTY WITH STENT PLACEMENT  03/2016; 04/2017   2017-[facility]: BMS to RCA-pt was placed on Brilinta.  04/2017: DES to RCA.  [name] EYE SURGERY Bilateral cataract  . HEMORRHOID SURGERY    . INTRAOCULAR LENS INSERTION Bilateral   . KNEE SURGERY Right   . LEFT AND RIGHT HEART CATHETERIZATION WITH CORONARY ANGIOGRAM [name] 12/24/2012   Procedure: LEFT AND RIGHT HEART CATHETERIZATION WITH CORONARY ANGIOGRAM;  Surgeon: [name], MD;  Location: [facility];  Service: Cardiovascular;  Laterality: [name];  . LEG SURGERY Bilateral    lenghtening   . PACEMAKER PLACEMENT  04/13/2016   2nd deg HB after TAVR, pt had DC MDT PPM placed.  [name] SHOULDER ARTHROSCOPY  08/30/2011   Procedure: ARTHROSCOPY SHOULDER;  Surgeon: [name], MD;   Location: [place];  Service: Orthopedics;  Laterality: Right;  Right Shoulder Arthroscopy, Debridement, and Decompression  . SPINAL CORD STIMULATOR INSERTION [name] 09/10/2015   Procedure: LUMBAR SPINAL CORD STIMULATOR INSERTION;  Surgeon: [name], MD;  Location: [facility] [facility];  Service: Neurosurgery;  Laterality: [name];  . 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  R big toe surg due to osteoarth  . TONSILLECTOMY    . traeculectomy Left    eye  . TRANSCATHETER AORTIC VALVE REPLACEMENT, TRANSFEMORAL  04/11/2016  . TRANSESOPHAGEAL ECHOCARDIOGRAM  03/09/2016   [facility]: EF 55-60%, PFO seen with bi-directional shunting, no thrombus in appendage.  . TRANSTHORACIC ECHOCARDIOGRAM  01/2015; 01/2016; 05/18/16; 09/18/16, 05/2017, 08/2017   01/2015 No signif change in aortic stenosis (moderate).  01/2016 Severe LVH w/small LV cavity, EF 60-65%, grade I diast dysfxn.  05/2016 (s/p TAVR): EF 50-55%, grd I DD, biopros AV  good.  08/2016--EF 50-55%, LV septal motion c/w conduction abnl, grd I DD,mild MS,bioprosth aortic valve well seated, w/trace AR. 05/2017 TTE EF 35%. 08/2017-EF 35%, mod diff hypokin LV, grd I DD, biopros AV good.   . TRANSTHORACIC ECHOCARDIOGRAM     04/2018: EF 40-45%, mod diffuse LV hypokinesis, grd I DD, bioprosth AV well seated, no AS or AR.  [name] VITRECTOMY      Family History  Problem Relation Age of Onset  . Hypertension Mother   . Coronary artery disease Mother   . Heart attack Mother   . Neuropathy Mother   . Pulmonary fibrosis Father        asbestosis   Social History:  reports that he has never smoked. He has never used smokeless tobacco. He reports that he does not drink alcohol or use drugs.  Allergies:  Allergies  Allergen Reactions  . Brimonidine Tartrate Shortness Of Breath    Alphagan-Shortness of breath  . Brimonidine Tartrate Palpitations  . Brinzolamide Shortness Of Breath    AZOPT- Shortness of breath  . Latanoprost Shortness Of Breath    XALATAN- Shortness of breath   . Nucynta [Tapentadol] Shortness Of Breath  . Sulfa Antibiotics Palpitations  . Timolol Maleate Shortness Of Breath and Other (See Comments)    TIMOPTIC- Aggravated asthma  . Cardizem  [Diltiazem Hcl] Swelling  . Diltiazem Swelling     leg swelling  . Rofecoxib Swelling     VIOXX- leg swelling  . Vancomycin Hives and Other (See Comments)    Possible "Red Man Syndrome"? > hives/blisters  . Codeine Other (See Comments)    Childhood reaction  . Celecoxib Other (See Comments)    CELLBREX-confusion  . Colchicine Diarrhea    diarrhea  . Tape Rash    No medications prior to admission.    Results for orders placed or performed during the hospital encounter of 08/28/19 (from the past 48 hour(s))  SARS CORONAVIRUS 2 (TAT 6-24 HRS) Nasopharyngeal Nasopharyngeal Swab     Status: None   Collection Time: 08/28/19  1:05 PM   Specimen: Nasopharyngeal Swab  Result Value Ref Range   SARS Coronavirus 2 NEGATIVE NEGATIVE    Comment: (NOTE) SARS-CoV-2 target nucleic acids are NOT DETECTED. The SARS-CoV-2 RNA is generally detectable in upper and lower respiratory specimens during the acute phase of infection. Negative results do not preclude SARS-CoV-2 infection, do not rule out co-infections with other pathogens, and should not be used as the sole basis for treatment or other patient management decisions. Negative results must be combined with clinical observations, patient history, and epidemiological information. The expected result is Negative. Fact Sheet for Patients: SugarRoll.be Fact Sheet for Healthcare Providers: https://www.woods-mathews.com/ This test is not yet approved or cleared by the [place] FDA and  has been authorized for detection and/or diagnosis of SARS-CoV-2 by FDA under an Emergency Use Authorization (EUA). This EUA will remain  in effect (meaning this test can be used) for the duration of the COVID-19 declaration under  Section 56 4(b)(1) of the Act, 21 U.S.C. section 360bbb-3(b)(1), unless the authorization is terminated or revoked sooner. Performed at [facility], [place] [address], [place], [place] [address]    No results found.  Review of Systems  All other systems reviewed and are negative.   There were no vitals taken for this visit. Physical Exam   [name], oriented, no adenopathy, well-dressed, normal affect, normal respiratory effort. Examination patient has a good dorsalis pedis pulse he has  a necrotic ulcer beneath the second metatarsal head.  There is no ascending cellulitis he does have venous swelling.  After informed consent a 10 blade knife was used to debride the skin and soft tissue back to bleeding viable granulation tissue silver nitrate was used for hemostasis the silver nitrate Q-tip probes all the way down to the MTP joint.  A sterile dressing was applied Lungs Clear. Cor: Regular rate and Rhythm Assessment/Plan Plan: Discussed with the patient the fact that the ulcer probes to bone with involvement of the base of the proximal phalanx and the second metatarsal head and with previous great toe amputation his best option would be to proceed with a transmetatarsal amputation.  Risks and benefits were discussed including risk of the wound not healing need for additional surgery.  Patient states he understands wished to proceed at this time plan for outpatient left transmetatarsal amputation on Friday.  Bevely Palmer Jaidin Ugarte, PA 08/29/2019, 6:49 AM

## 2019-08-29 NOTE — Transfer of Care (Signed)
Immediate Anesthesia Transfer of Care Note  Patient: Tim [name]  Procedure(s) Performed: LEFT TRANSMETATARSAL AMPUTATION (Left )  Patient Location: PACU  Anesthesia Type:MAC and Regional  Level of Consciousness: drowsy and patient cooperative  Airway & Oxygen Therapy: Patient Spontanous Breathing and Patient connected to face mask oxygen  Post-op Assessment: Report given to RN, Post -op Vital signs reviewed and stable and Patient moving all extremities X 4  Post vital signs: Reviewed and stable  Last Vitals:  Vitals Value Taken Time  BP 126/63 08/29/19 1356  Temp 36.3 C 08/29/19 1355  Pulse 72 08/29/19 1358  Resp 9 08/29/19 1358  SpO2 96 % 08/29/19 1358  Vitals shown include unvalidated device data.  Last Pain:  Vitals:   08/29/19 1120  TempSrc:   PainSc: 0-No pain         Complications: No apparent anesthesia complications

## 2019-08-30 ENCOUNTER — Encounter (HOSPITAL_COMMUNITY): Payer: Self-pay | Admitting: *Deleted

## 2019-08-30 ENCOUNTER — Emergency Department (HOSPITAL_COMMUNITY)
Admission: EM | Admit: 2019-08-30 | Discharge: 2019-08-30 | Disposition: A | Discharge status: Home / Self Care | Payer: Medicare Other | Attending: Emergency Medicine | Admitting: Emergency Medicine

## 2019-08-30 ENCOUNTER — Other Ambulatory Visit: Payer: Self-pay

## 2019-08-30 DIAGNOSIS — E119 Type 2 diabetes mellitus without complications: Secondary | ICD-10-CM | POA: Diagnosis not present

## 2019-08-30 DIAGNOSIS — Z95810 Presence of automatic (implantable) cardiac defibrillator: Secondary | ICD-10-CM | POA: Diagnosis not present

## 2019-08-30 DIAGNOSIS — Z5189 Encounter for other specified aftercare: Secondary | ICD-10-CM

## 2019-08-30 DIAGNOSIS — I10 Essential (primary) hypertension: Secondary | ICD-10-CM | POA: Insufficient documentation

## 2019-08-30 DIAGNOSIS — I2510 Atherosclerotic heart disease of native coronary artery without angina pectoris: Secondary | ICD-10-CM | POA: Diagnosis not present

## 2019-08-30 DIAGNOSIS — Z79899 Other long term (current) drug therapy: Secondary | ICD-10-CM | POA: Diagnosis not present

## 2019-08-30 DIAGNOSIS — Z4801 Encounter for change or removal of surgical wound dressing: Secondary | ICD-10-CM | POA: Diagnosis present

## 2019-08-30 DIAGNOSIS — Z7982 Long term (current) use of aspirin: Secondary | ICD-10-CM | POA: Diagnosis not present

## 2019-08-30 DIAGNOSIS — J449 Chronic obstructive pulmonary disease, unspecified: Secondary | ICD-10-CM | POA: Diagnosis not present

## 2019-08-30 DIAGNOSIS — Z7901 Long term (current) use of anticoagulants: Secondary | ICD-10-CM | POA: Insufficient documentation

## 2019-08-30 DIAGNOSIS — Z7984 Long term (current) use of oral hypoglycemic drugs: Secondary | ICD-10-CM | POA: Insufficient documentation

## 2019-08-30 NOTE — ED Notes (Signed)
5N RNs completed wound vac dressing change. New post-op shoe applied as previous one was saturated with blood.

## 2019-08-30 NOTE — ED Triage Notes (Signed)
The pt had ht lower portion of his  Lt foot including his toes this past Thursday by dr duda  He has had an ulcer  Today the foor has been bleeding

## 2019-08-30 NOTE — ED Notes (Signed)
Pt sitting in wheelchair. Refused to get in the bed.

## 2019-08-30 NOTE — ED Notes (Signed)
RN spoke w/ 5N charge nurse regarding wound vac dressing change. 5N charge stated they would call back after shift change.

## 2019-08-30 NOTE — Discharge Instructions (Signed)
Please be sure to follow-up with your physician.  Return here for concerning changes.

## 2019-08-30 NOTE — ED Provider Notes (Signed)
[facility] EMERGENCY DEPARTMENT Provider Note   CSN: [id number] Arrival date & time: 08/30/19  1614     History Chief Complaint  Patient presents with  . Wound Check    Tim [name] is a 79 y.o. male.  Patient presents for a wound check.  He recently had a transmetatarsal amputation done by Dr. Sharol Given and has a wound VAC in place.  He said that yesterday evening the wound VAC dressing ripped and came apart.  It is been oozing blood since then.  He denies any fevers no other known injuries.        Past Medical History:  Diagnosis Date  . AICD (automatic cardioverter/defibrillator) present   . ASTHMA   . Asthma    as a child  . BPH (benign prostatic hypertrophy)   . CAD (coronary artery disease)    Nonobstructive by 12/2012 cath; then 03/2016 he required BMS to RCA ([facility]).  In-stent restenosis on cath 11/01/16, baloon angioplasty successful--pt to take plavix (no ASA), + eliquis (for PAF).  [name] CATARACT, HX OF   . Chronic combined systolic and diastolic CHF (congestive heart failure) ([place]) 05/2016   Ischemic CM.  04/2018 EF 40-45%, grd I DD.  [name] Chronic pain syndrome    Lumbar DDD; chronic neuropathic pain (DM); has spinal stimulator and sees pain mgmt MD  . COPD   . Diabetes mellitus type 2 with complications (HCC)    LKJ1P jumped from 5.7% to 6.1% 03/2015---started metformin at that time.  DM 2 dx by fasting gluc criteria 2018.  Has chronic neuropath pain  . Elevated transaminase level 02/2016   Suspect due to fatty liver (documented on u/s 2007). Plan repeat labs 03/2016.  [name] Essential hypertension, benign   . Fatty liver 2007   2007 u/s showed fatty liver with hepatosplenomegaly.  2019 repeat u/s->fatty liver but no cirrhosis or hepatosplenomegaly.  [name] GERD (gastroesophageal reflux disease)   . Glaucoma   . GOUT   . Hallux valgus (acquired)   . HH (hiatus hernia)   . HYPERCHOLESTEROLEMIA-PURE   . Hypogonadism male   . Lumbosacral neuritis   .  Lumbosacral spondylosis    Lumbar spinal stenosis with neurogenic claudication--contributes to his chronic pain syndrome  . Normal memory function 08/2014   Neuropsychological testing ([facility]): no cognitive impairment or sign of neurodegenerative disorder.  Likely has adjustment d/o with mixed anxiety/depressed features and may benefit from low dose SNRI.    [name] Normocytic anemia 03/2016   Mild-pt needs ferritin and vit B12 level checked (as of 03/22/16)  . NSTEMI (non-ST elevated myocardial infarction) ([place]) 03/20/2016   BMS to RCA  . Obesity hypoventilation syndrome ([place])   . Obesity, unspecified   . Orthostatic hypotension   . OSA on CPAP    8 cm H2O  . OSTEOARTHRITIS   . Paroxysmal atrial fibrillation ([place]) 2003    (? chronic?) Off anticoag for a while due to falls.  Then apixaban started 12/2014.  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 Ella Bodo): neuropathic pain->diff to treat, failed nucynta, failed spinal stimul trial, oxycontin hs + tramadol + gabap as of 12/2017 f/u Dr. Letta Pate.  . Personal history of colonic adenoma 10/30/2012   Diminutive adenoma, consider repeat 2019 per GI  . Pneumonia   . Presence of cardiac defibrillator 11/07/2017  . Primary osteoarthritis of both knees    Bone on bone of medial compartments, + signif patellofemoral arth bilat.--supartz inj series started 09/12/17  .  PUD (peptic ulcer disease)   . PULMONARY HYPERTENSION, HX OF   . Secondary male hypogonadism 2017  . 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 Shortness of breath    with exertion: much improved s/p TAVR and treatment for CHF.  . Sick sinus syndrome (HCC)    PPM placed  . Thrombocytopenia ([place]) 2018   HSM on 2007 abd u/s---suspect some mild splenic sequestration chronically.  [name] Unspecified glaucoma(365.9)   . Unspecified hereditary and idiopathic peripheral neuropathy  approx age 68   bilat LE's, ? left arm, too.  Feet became progressively numb + left foot pain intermittently.  Pt may be trying a spinal stimulator (as of 05/2015)  . VENOUS INSUFFICIENCY    Being followed by Dr. Sharol Given as of 10/2016 for two R LL venous stasis ulcers/skin tears.  Healed as of 10/30/16 f/u with Dr. Sharol Given.  . VENTRAL HERNIA     Patient Active Problem List   Diagnosis Date Noted  . Subacute osteomyelitis, left ankle and foot ([place])   . 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 Diabetic neuropathy, painful ([place]) 12/26/2016  . Neuropathic pain of foot, left 06/20/2016  . 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 Status post amputation of left great toe ([place]) 06/08/2015  . Spinal stenosis of lumbar region 02/16/2015  . Hereditary and idiopathic peripheral neuropathy 01/12/2015  . Oral thrush 12/28/2014  . Postural dizziness 05/24/2014  . Paresthesias with subjective weakness 01/30/2014  . Bilateral thigh pain 01/30/2014  . Abdominal wall pain in right upper quadrant 01/22/2014  . Gross hematuria 09/25/2013  . Intertrigo 09/25/2013  . IBS (irritable bowel syndrome) 09/11/2013  . Chronic pain syndrome 08/06/2013  . CAD (coronary artery disease), native coronary artery 12/25/2012  . Paroxysmal atrial fibrillation ([place]) 12/25/2012  . Moderate aortic stenosis 12/25/2012  . Aortic stenosis 12/24/2012  . Personal history of colonic adenoma 10/30/2012  . Diverticulosis of colon (without mention of hemorrhage) 10/30/2012  . Internal hemorrhoids 10/30/2012  . Change in bowel habits intermittent loose stools 10/30/2012  . Routine health maintenance 06/09/2012  . Hereditary peripheral neuropathy 10/10/2011  . Long term current use of anticoagulant - apixiban 08/16/2010  . Dyspnea on exertion 09/08/2009  . HYPERCHOLESTEROLEMIA-PURE 01/13/2009  . Essential hypertension, benign 01/13/2009  . EDEMA 01/13/2009  . GOUT  01/12/2009  . GLAUCOMA 01/12/2009  . Venous (peripheral) insufficiency 01/12/2009  . COPD (chronic obstructive pulmonary disease) ([place]) 01/12/2009  . VENTRAL HERNIA 01/12/2009  . OSTEOARTHRITIS 01/12/2009  . ARTHRITIS 01/12/2009  . DEGENERATIVE DISC DISEASE 01/12/2009  . CATARACT, HX OF 01/12/2009  . HEART MURMUR, HX OF 01/12/2009  . BENIGN PROSTATIC HYPERTROPHY, HX OF 01/12/2009  . Obesity 01/17/2007  . OBSTRUCTIVE SLEEP APNEA 01/17/2007  . ASTHMA 01/17/2007  . GERD 01/17/2007  . HALLUX VALGUS, ACQUIRED 01/17/2007  . PULMONARY HYPERTENSION, HX OF 01/17/2007    Past Surgical History:  Procedure Laterality Date  . AMPUTATION Left 04/11/2013   Procedure: AMPUTATION DIGIT Left 3rd toe;  Surgeon: [name], MD;  Location: [facility];  Service: Orthopedics;  Laterality: Left;  Left 3rd toe amputation at [facility]  . CARDIAC CATHETERIZATION  1997; 03/10/16   1997 Non-obstructive disease.  03/2016 BMS to RCA, with 25% pDiag dz, o/w normal cors per cath 03/07/16.  Cath 11/01/16: in stent restenosis, successful baloon angioplasty.  [name] CARDIAC CATHETERIZATION  12/24/2012   mild <  20% LCx, prox 30% RCA; LVEF 55-65% , moderate pulmonary HTN, moderate AS  . CARDIAC DEFIBRILLATOR PLACEMENT  11/07/2017   [facility] MRI Quad CRT defibrillator  . CARDIOVASCULAR STRESS TEST  05/11/16 ([facility])   Myocardial perfusion imaging:  No ischemia; scar in apex, global hypokinesis, EF 36%.  . Carotid dopplers  03/09/2016   [facility]: no hemodynamically significant stenosis on either side.  . CHOLECYSTECTOMY    . COLONOSCOPY    . COLONOSCOPY [name] 10/30/2012   Procedure: COLONOSCOPY;  Surgeon: [name], MD;  Location: [facility];  Service: Endoscopy;  Laterality: [name];  . CORONARY ANGIOPLASTY WITH STENT PLACEMENT  03/2016; 04/2017   2017-[facility]: BMS to RCA-pt was placed on Brilinta.  04/2017: DES to RCA.  [name] EYE SURGERY Bilateral cataract  . HEMORRHOID SURGERY    . INTRAOCULAR LENS INSERTION Bilateral   . KNEE SURGERY Right    . LEFT AND RIGHT HEART CATHETERIZATION WITH CORONARY ANGIOGRAM [name] 12/24/2012   Procedure: LEFT AND RIGHT HEART CATHETERIZATION WITH CORONARY ANGIOGRAM;  Surgeon: [name], MD;  Location: [name] CATH LAB;  Service: Cardiovascular;  Laterality: [name];  . LEG SURGERY Bilateral    lenghtening   . PACEMAKER PLACEMENT  04/13/2016   2nd deg HB after TAVR, pt had DC MDT PPM placed.  [name] SHOULDER ARTHROSCOPY  08/30/2011   Procedure: ARTHROSCOPY SHOULDER;  Surgeon: [name], MD;  Location: [facility];  Service: Orthopedics;  Laterality: Right;  Right Shoulder Arthroscopy, Debridement, and Decompression  . SPINAL CORD STIMULATOR INSERTION [name] 09/10/2015   Procedure: LUMBAR SPINAL CORD STIMULATOR INSERTION;  Surgeon: [name], MD;  Location: [facility];  Service: Neurosurgery;  Laterality: [name];  . 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  R big toe surg due to osteoarth  . TONSILLECTOMY    . traeculectomy Left    eye  . TRANSCATHETER AORTIC VALVE REPLACEMENT, TRANSFEMORAL  04/11/2016  . TRANSESOPHAGEAL ECHOCARDIOGRAM  03/09/2016   [facility]: EF 55-60%, PFO seen with bi-directional shunting, no thrombus in appendage.  . TRANSTHORACIC ECHOCARDIOGRAM  01/2015; 01/2016; 05/18/16; 09/18/16, 05/2017, 08/2017   01/2015 No signif change in aortic stenosis (moderate).  01/2016 Severe LVH w/small LV cavity, EF 60-65%, grade I diast dysfxn.  05/2016 (s/p TAVR): EF 50-55%, grd I DD, biopros AV good.  08/2016--EF 50-55%, LV septal motion c/w conduction abnl, grd I DD,mild MS,bioprosth aortic valve well seated, w/trace AR. 05/2017 TTE EF 35%. 08/2017-EF 35%, mod diff hypokin LV, grd I DD, biopros AV good.   . TRANSTHORACIC ECHOCARDIOGRAM     04/2018: EF 40-45%, mod diffuse LV hypokinesis, grd I DD, bioprosth AV well seated, no AS or AR.  [name] VITRECTOMY         Family History  Problem Relation Age of Onset  . Hypertension Mother   . Coronary artery disease Mother   . Heart attack Mother   . Neuropathy Mother   .  Pulmonary fibrosis Father        asbestosis    Social History   Tobacco Use  . Smoking status: Never Smoker  . Smokeless tobacco: Never Used  Substance Use Topics  . Alcohol use: No    Alcohol/week: 0.0 standard drinks  . Drug use: No    Types: Oxycodone    Home Medications Prior to Admission medications   Medication Sig Start Date End Date Taking? Authorizing Provider  albuterol (PROVENTIL HFA;VENTOLIN HFA) 108 (90 BASE) MCG/ACT inhaler Inhale 2 puffs into the lungs 4 (four) times daily as  needed for wheezing or shortness of breath.    [provider]  allopurinol (ZYLOPRIM) 300 MG tablet TAKE 1 TABLET BY MOUTH ONCE DAILY WITH FOOD Patient taking differently: Take 300 mg by mouth daily.  06/12/19   McGowen, Adrian Blackwater, MD  apixaban (ELIQUIS) 5 MG TABS tablet Take 1 tablet (5 mg total) by mouth 2 (two) times daily. 01/02/17   Lelon Perla, MD  ASPIRIN LOW DOSE 81 MG EC tablet Take 81 mg by mouth daily. 02/03/19   [provider]  B Complex-C (B-COMPLEX WITH VITAMIN C) tablet Take 1 tablet by mouth daily. Take 1 tablet daily, 276m    [provider]  cadexomer iodine (IODOSORB) 0.9 % gel Apply 1 application topically daily as needed for wound care. Apply to the affected area daily plus dry dressing 08/19/19   Persons, MBevely Palmer PUtah clotrimazole-betamethasone (LOTRISONE) cream apply to affected area twice a day Patient taking differently: Apply 1 application topically 2 (two) times daily as needed (affected area).  11/04/15   McGowen, PAdrian Blackwater MD  cyclobenzaprine (FLEXERIL) 5 MG tablet Take 0.5 tablet to 1 tablet at bedtime  for muscle spasm. 10/21/18   TBayard Hugger NP  doxycycline (VIBRAMYCIN) 100 MG capsule Take 1 capsule (100 mg total) by mouth 2 (two) times daily. 08/14/19   POrpah Greek MD  ezetimibe (ZETIA) 10 MG tablet take 1 tablet by mouth once daily 01/18/17   CLelon Perla MD  finasteride (PROSCAR) 5 MG tablet TAKE 1 TABLET BY MOUTH  EVERY DAY 05/06/19   McGowen, PAdrian Blackwater MD  fluticasone (FLONASE) 50 MCG/ACT nasal spray Place 2 sprays into both nostrils as needed for allergies or rhinitis.    [provider]  Fluticasone-Salmeterol (ADVAIR DISKUS) 250-50 MCG/DOSE AEPB Inhale 1 puff into the lungs 2 (two) times daily.  03/19/19   [provider]  gabapentin (NEURONTIN) 800 MG tablet TAKE 1 TABLET (800 MG TOTAL) BY MOUTH 4 (FOUR) TIMES DAILY. 04/21/19   Kirsteins, ALuanna Salk MD  isosorbide mononitrate (IMDUR) 60 MG 24 hr tablet TAKE 1 TABLET BY MOUTH EVERY DAY 08/28/19   McGowen, PAdrian Blackwater MD  KLOR-CON M20 20 MEQ tablet TAKE 1 TABLET BY MOUTH EVERY DAY 05/06/19   McGowen, PAdrian Blackwater MD  metoprolol succinate (TOPROL-XL) 25 MG 24 hr tablet Take 25 mg by mouth daily. Along w/ 550mto equal 7549maily    [provider]  metoprolol succinate (TOPROL-XL) 50 MG 24 hr tablet Take 50 mg by mouth daily. Take with or immediately following a meal.    [provider]  Multiple Vitamin (MULTIVITAMIN) capsule Take 1 capsule by mouth daily.    [provider]  niacin (NIASPAN) 1000 MG CR tablet TAKE 1 TABLET BY MOUTH TWICE A DAY Patient taking differently: Take 1,000 mg by mouth 2 (two) times daily.  04/21/19   McGowen, PhiAdrian BlackwaterD  nitroGLYCERIN (NITROSTAT) 0.4 MG SL tablet Place 0.4 mg under the tongue every 5 (five) minutes as needed for chest pain.  09/04/17   [provider]  omeprazole (PRILOSEC) 10 MG capsule TAKE 1 CAPSULE BY MOUTH EVERY DAY Patient taking differently: Take 10 mg by mouth daily.  05/05/19   McGowen, PhiAdrian BlackwaterD  oxyCODONE-acetaminophen (PERCOCET) 5-325 MG tablet Take 1 tablet by mouth every 4 (four) hours as needed. 08/29/19 08/28/20  Persons, MarBevely PalmerA  rosuvastatin (CRESTOR) 40 MG tablet take 1 tablet by mouth once daily 02/05/17   CreKirk Ruths  S, MD  sacubitril-valsartan (ENTRESTO) 24-26 MG Take 1 tablet by mouth 2 (two) times daily. 07/19/17   [provider]   torsemide (DEMADEX) 20 MG tablet Take 20 mg by mouth daily as needed (fluid).  07/26/18   [provider]  VITAMIN D, CHOLECALCIFEROL, PO Take 1 tablet by mouth daily.     [provider]    Allergies    Brimonidine tartrate, Brimonidine tartrate, Brinzolamide, Latanoprost, Nucynta [tapentadol], Sulfa antibiotics, Timolol maleate, Cardizem  [diltiazem hcl], Diltiazem, Rofecoxib, Vancomycin, Codeine, Celecoxib, Colchicine, and Tape  Review of Systems   Review of Systems  Constitutional: Negative for fever.  Musculoskeletal: Positive for arthralgias.  Skin: Positive for wound.       Bleeding   All other systems reviewed and are negative.   Physical Exam Updated Vital Signs BP (!) 146/71 (BP Location: Right Wrist)   Pulse 70   Temp 98.6 F (37 C) (Skin)   Resp 20   Wt (!) 154.2 kg   SpO2 98%   BMI 46.11 kg/m   Physical Exam HENT:     Mouth/Throat:     Mouth: Mucous membranes are moist.  Cardiovascular:     Rate and Rhythm: Normal rate.  Pulmonary:     Effort: Pulmonary effort is normal.  Musculoskeletal:     Comments: Patient's wound VAC covering has torn and the wound VAC has come off.  There is some oozing of blood through the wound.  The sutures appear intact.  There does not appear to be any signs of infection.  Skin:    General: Skin is warm and dry.  Neurological:     General: No focal deficit present.     Mental Status: He is alert.     ED Results / Procedures / Treatments   Labs (all labs ordered are listed, but only abnormal results are displayed) Labs Reviewed - No data to display  EKG None  Radiology No results found.  Procedures Procedures (including critical care time)  Medications Ordered in ED Medications - No data to display  ED Course  I have reviewed the triage vital signs and the nursing notes.  Pertinent labs & imaging results that were available during my care of the patient were reviewed by me and considered in my  medical decision making (see chart for details).    MDM Rules/Calculators/A&P                      Patient presents after his wound VAC broke.  His wound looks okay with the sutures intact.  No active bleeding.  We are attempting to replace the wound VAC and he will be discharged following that. Final Clinical Impression(s) / ED Diagnoses Final diagnoses:  Visit for wound check    Rx / DC Orders ED Discharge Orders    None       Malvin Johns, MD 08/30/19 2059

## 2019-08-31 NOTE — Anesthesia Postprocedure Evaluation (Signed)
Anesthesia Post Note  Patient: Tim [name]  Procedure(s) Performed: LEFT TRANSMETATARSAL AMPUTATION (Left )     Patient location during evaluation: PACU Anesthesia Type: Regional Level of consciousness: awake and alert Pain management: pain level controlled Vital Signs Assessment: post-procedure vital signs reviewed and stable Respiratory status: spontaneous breathing, nonlabored ventilation, respiratory function stable and patient connected to nasal cannula oxygen Cardiovascular status: stable and blood pressure returned to baseline Postop Assessment: no apparent nausea or vomiting Anesthetic complications: no    Last Vitals:  Vitals:   08/29/19 1425 08/29/19 1440  BP: (!) 114/59 (!) 114/53  Pulse: 72 68  Resp: 11 16  Temp:    SpO2: 94% 95%    Last Pain:  Vitals:   08/29/19 1425  TempSrc:   PainSc: 0-No pain                 Joline Encalada L Arryanna Holquin

## 2019-09-01 ENCOUNTER — Ambulatory Visit (INDEPENDENT_AMBULATORY_CARE_PROVIDER_SITE_OTHER): Payer: Medicare Other | Admitting: Physician Assistant

## 2019-09-01 ENCOUNTER — Other Ambulatory Visit: Payer: Self-pay

## 2019-09-01 VITALS — Ht 72.0 in | Wt 339.0 lb

## 2019-09-01 DIAGNOSIS — N390 Urinary tract infection, site not specified: Secondary | ICD-10-CM

## 2019-09-01 DIAGNOSIS — M86272 Subacute osteomyelitis, left ankle and foot: Secondary | ICD-10-CM

## 2019-09-01 DIAGNOSIS — Q2112 Patent foramen ovale: Secondary | ICD-10-CM

## 2019-09-01 DIAGNOSIS — Q211 Atrial septal defect: Secondary | ICD-10-CM

## 2019-09-01 DIAGNOSIS — R7881 Bacteremia: Secondary | ICD-10-CM

## 2019-09-01 DIAGNOSIS — B952 Enterococcus as the cause of diseases classified elsewhere: Secondary | ICD-10-CM

## 2019-09-01 HISTORY — DX: Atrial septal defect: Q21.1

## 2019-09-01 HISTORY — DX: Patent foramen ovale: Q21.12

## 2019-09-01 HISTORY — DX: Bacteremia: R78.81

## 2019-09-01 HISTORY — DX: Enterococcus as the cause of diseases classified elsewhere: B95.2

## 2019-09-01 HISTORY — DX: Urinary tract infection, site not specified: N39.0

## 2019-09-01 NOTE — Progress Notes (Signed)
Office Visit Note   Patient: Tim [name]           Date of Birth: [birth year]           MRN: [id number] Visit Date: 09/01/2019              Requested by: Tammi Sou, MD [address] [place],  [place] [address] PCP: Tammi Sou, MD  Chief Complaint  Patient presents with  . Left Foot - Routine Post Op    08/29/19 left transmet amputation       HPI: This is a pleasant gentleman who is 3 days status post left transmetatarsal amputation.  He was seen in the emergency room over the weekend because the wound VAC had ripped off when he stumbled on it.  The emergency room tried to reapply it but this did not work.  It has been covered with a wrap he is also complaining that his current postop shoe does not seem to fit properly  Assessment & Plan: Visit Diagnoses: No diagnosis found.  Plan: He will start doing dry dressing changes.  He will follow-up in 1 week.  We will try to provide him with a better fitting shoe today.  Follow-Up Instructions: No follow-ups on file.   Ortho Exam  [name], oriented, no adenopathy, well-dressed, normal affect, normal respiratory effort. Focused examination demonstrates healing wound.  Well approximated wound edges moderate amount of soft tissue swelling there is good bloody drainage.  No signs of necrosis pulses are intact  Imaging: No results found. No images are attached to the encounter.  Labs: Lab Results  Component Value Date   HGBA1C 6.1 03/27/2019   HGBA1C 6.1 09/16/2018   HGBA1C 6.0 06/06/2018   ESRSEDRATE 20 02/09/2016   ESRSEDRATE 36 (H) 01/28/2014   LABURIC 3.5 (L) 09/10/2013   LABORGA NO GROWTH 08/25/2016     Lab Results  Component Value Date   ALBUMIN 3.5 08/29/2019   ALBUMIN 4.0 08/13/2019   ALBUMIN 3.6 03/27/2019   LABURIC 3.5 (L) 09/10/2013    No results found for: MG No results found for: VD25OH  No results found for: PREALBUMIN CBC EXTENDED Latest Ref Rng & Units 08/29/2019  08/13/2019 03/27/2019  WBC 4.0 - 10.5 K/uL 11.0(H) 7.8 7.8  RBC 4.22 - 5.81 MIL/uL 4.04(L) 4.32 3.95(L)  HGB 13.0 - 17.0 g/dL 12.3(L) 13.0 12.0(L)  HCT 39.0 - 52.0 % 38.5(L) 39.3 35.4(L)  PLT 150 - 400 K/uL 134(L) 170 118.0(L)  NEUTROABS 1.7 - 7.7 K/uL - 5.1 -  LYMPHSABS 0.7 - 4.0 K/uL - 1.5 -     Body mass index is 45.98 kg/m.  Orders:  No orders of the defined types were placed in this encounter.  No orders of the defined types were placed in this encounter.    Procedures: No procedures performed  Clinical Data: No additional findings.  ROS:  All other systems negative, except as noted in the HPI. Review of Systems  Objective: Vital Signs: Ht 6' (1.829 m)   Wt (!) 339 lb (153.8 kg)   BMI 45.98 kg/m   Specialty Comments:  No specialty comments available.  PMFS History: Patient Active Problem List   Diagnosis Date Noted  . Subacute osteomyelitis, left ankle and foot ([place])   . 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 Diabetic neuropathy, painful ([place]) 12/26/2016  . Neuropathic pain of foot, left 06/20/2016  . Insulin resistance  07/22/2015  . Status post amputation of left great toe ([place]) 06/08/2015  . Spinal stenosis of lumbar region 02/16/2015  . Hereditary and idiopathic peripheral neuropathy 01/12/2015  . Oral thrush 12/28/2014  . Postural dizziness 05/24/2014  . Paresthesias with subjective weakness 01/30/2014  . Bilateral thigh pain 01/30/2014  . Abdominal wall pain in right upper quadrant 01/22/2014  . Gross hematuria 09/25/2013  . Intertrigo 09/25/2013  . IBS (irritable bowel syndrome) 09/11/2013  . Chronic pain syndrome 08/06/2013  . CAD (coronary artery disease), native coronary artery 12/25/2012  . Paroxysmal atrial fibrillation ([place]) 12/25/2012  . Moderate aortic stenosis 12/25/2012  . Aortic stenosis 12/24/2012  . Personal history of colonic adenoma 10/30/2012  .  Diverticulosis of colon (without mention of hemorrhage) 10/30/2012  . Internal hemorrhoids 10/30/2012  . Change in bowel habits intermittent loose stools 10/30/2012  . Routine health maintenance 06/09/2012  . Hereditary peripheral neuropathy 10/10/2011  . Long term current use of anticoagulant - apixiban 08/16/2010  . Dyspnea on exertion 09/08/2009  . HYPERCHOLESTEROLEMIA-PURE 01/13/2009  . Essential hypertension, benign 01/13/2009  . EDEMA 01/13/2009  . GOUT 01/12/2009  . GLAUCOMA 01/12/2009  . Venous (peripheral) insufficiency 01/12/2009  . COPD (chronic obstructive pulmonary disease) ([place]) 01/12/2009  . VENTRAL HERNIA 01/12/2009  . OSTEOARTHRITIS 01/12/2009  . ARTHRITIS 01/12/2009  . DEGENERATIVE DISC DISEASE 01/12/2009  . CATARACT, HX OF 01/12/2009  . HEART MURMUR, HX OF 01/12/2009  . BENIGN PROSTATIC HYPERTROPHY, HX OF 01/12/2009  . Obesity 01/17/2007  . OBSTRUCTIVE SLEEP APNEA 01/17/2007  . ASTHMA 01/17/2007  . GERD 01/17/2007  . HALLUX VALGUS, ACQUIRED 01/17/2007  . PULMONARY HYPERTENSION, HX OF 01/17/2007   Past Medical History:  Diagnosis Date  . AICD (automatic cardioverter/defibrillator) present   . ASTHMA   . Asthma    as a child  . BPH (benign prostatic hypertrophy)   . CAD (coronary artery disease)    Nonobstructive by 12/2012 cath; then 03/2016 he required BMS to RCA ([facility]).  In-stent restenosis on cath 11/01/16, baloon angioplasty successful--pt to take plavix (no ASA), + eliquis (for PAF).  [name] CATARACT, HX OF   . Chronic combined systolic and diastolic CHF (congestive heart failure) ([place]) 05/2016   Ischemic CM.  04/2018 EF 40-45%, grd I DD.  [name] Chronic pain syndrome    Lumbar DDD; chronic neuropathic pain (DM); has spinal stimulator and sees pain mgmt MD  . COPD   . Diabetes mellitus type 2 with complications (HCC)    [id number] jumped from 5.7% to 6.1% 03/2015---started metformin at that time.  DM 2 dx by fasting gluc criteria 2018.  Has chronic neuropath pain  .  Elevated transaminase level 02/2016   Suspect due to fatty liver (documented on u/s 2007). Plan repeat labs 03/2016.  [name] Essential hypertension, benign   . Fatty liver 2007   2007 u/s showed fatty liver with hepatosplenomegaly.  2019 repeat u/s->fatty liver but no cirrhosis or hepatosplenomegaly.  [name] GERD (gastroesophageal reflux disease)   . Glaucoma   . GOUT   . Hallux valgus (acquired)   . HH (hiatus hernia)   . HYPERCHOLESTEROLEMIA-PURE   . Hypogonadism male   . Lumbosacral neuritis   . Lumbosacral spondylosis    Lumbar spinal stenosis with neurogenic claudication--contributes to his chronic pain syndrome  . Normal memory function 08/2014   Neuropsychological testing ([facility]): no cognitive impairment or sign of neurodegenerative disorder.  Likely has adjustment d/o with mixed anxiety/depressed features and may benefit from low dose SNRI.    [name]  Normocytic anemia 03/2016   Mild-pt needs ferritin and vit B12 level checked (as of 03/22/16)  . NSTEMI (non-ST elevated myocardial infarction) ([place]) 03/20/2016   BMS to RCA  . Obesity hypoventilation syndrome ([place])   . Obesity, unspecified   . Orthostatic hypotension   . OSA on CPAP    8 cm H2O  . OSTEOARTHRITIS   . Paroxysmal atrial fibrillation ([place]) 2003    (? chronic?) Off anticoag for a while due to falls.  Then apixaban started 12/2014.  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 Ella Bodo): neuropathic pain->diff to treat, failed nucynta, failed spinal stimul trial, oxycontin hs + tramadol + gabap as of 12/2017 f/u Dr. Letta Pate.  . Personal history of colonic adenoma 10/30/2012   Diminutive adenoma, consider repeat 2019 per GI  . Pneumonia   . Presence of cardiac defibrillator 11/07/2017  . Primary osteoarthritis of both knees    Bone on bone of medial compartments, + signif patellofemoral arth bilat.--supartz inj series started 09/12/17  . PUD (peptic ulcer disease)   . PULMONARY  HYPERTENSION, HX OF   . Secondary male hypogonadism 2017  . 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 Shortness of breath    with exertion: much improved s/p TAVR and treatment for CHF.  . Sick sinus syndrome (HCC)    PPM placed  . Thrombocytopenia ([place]) 2018   HSM on 2007 abd u/s---suspect some mild splenic sequestration chronically.  [name] Unspecified glaucoma(365.9)   . Unspecified hereditary and idiopathic peripheral neuropathy approx age 37   bilat LE's, ? left arm, too.  Feet became progressively numb + left foot pain intermittently.  Pt may be trying a spinal stimulator (as of 05/2015)  . VENOUS INSUFFICIENCY    Being followed by Dr. Sharol Given as of 10/2016 for two R LL venous stasis ulcers/skin tears.  Healed as of 10/30/16 f/u with Dr. Sharol Given.  . VENTRAL HERNIA     Family History  Problem Relation Age of Onset  . Hypertension Mother   . Coronary artery disease Mother   . Heart attack Mother   . Neuropathy Mother   . Pulmonary fibrosis Father        asbestosis    Past Surgical History:  Procedure Laterality Date  . AMPUTATION Left 04/11/2013   Procedure: AMPUTATION DIGIT Left 3rd toe;  Surgeon: [name], MD;  Location: [facility];  Service: Orthopedics;  Laterality: Left;  Left 3rd toe amputation at [facility]  . AMPUTATION Left 08/29/2019   Procedure: LEFT TRANSMETATARSAL AMPUTATION;  Surgeon: [name], MD;  Location: [facility];  Service: Orthopedics;  Laterality: Left;  . CARDIAC CATHETERIZATION  1997; 03/10/16   1997 Non-obstructive disease.  03/2016 BMS to RCA, with 25% pDiag dz, o/w normal cors per cath 03/07/16.  Cath 11/01/16: in stent restenosis, successful baloon angioplasty.  [name] CARDIAC CATHETERIZATION  12/24/2012   mild < 20% LCx, prox 30% RCA; LVEF 55-65% , moderate pulmonary HTN, moderate AS  . CARDIAC DEFIBRILLATOR PLACEMENT  11/07/2017   [facility] MRI Quad CRT defibrillator  . CARDIOVASCULAR STRESS TEST  05/11/16 ([facility])    Myocardial perfusion imaging:  No ischemia; scar in apex, global hypokinesis, EF 36%.  . Carotid dopplers  03/09/2016   [facility]: no hemodynamically significant stenosis on either side.  . CHOLECYSTECTOMY    . COLONOSCOPY    . COLONOSCOPY [name] 10/30/2012   Procedure: COLONOSCOPY;  Surgeon: [name], MD;  Location: [facility];  Service: Endoscopy;  Laterality: [name];  . CORONARY ANGIOPLASTY WITH STENT PLACEMENT  03/2016; 04/2017   2017-[facility]: BMS to RCA-pt was placed on Brilinta.  04/2017: DES to RCA.  [name] EYE SURGERY Bilateral cataract  . HEMORRHOID SURGERY    . INTRAOCULAR LENS INSERTION Bilateral   . KNEE SURGERY Right   . LEFT AND RIGHT HEART CATHETERIZATION WITH CORONARY ANGIOGRAM [name] 12/24/2012   Procedure: LEFT AND RIGHT HEART CATHETERIZATION WITH CORONARY ANGIOGRAM;  Surgeon: [name], MD;  Location: [facility];  Service: Cardiovascular;  Laterality: [name];  . LEG SURGERY Bilateral    lenghtening   . PACEMAKER PLACEMENT  04/13/2016   2nd deg HB after TAVR, pt had DC MDT PPM placed.  [name] SHOULDER ARTHROSCOPY  08/30/2011   Procedure: ARTHROSCOPY SHOULDER;  Surgeon: [name], MD;  Location: [facility];  Service: Orthopedics;  Laterality: Right;  Right Shoulder Arthroscopy, Debridement, and Decompression  . SPINAL CORD STIMULATOR INSERTION [name] 09/10/2015   Procedure: LUMBAR SPINAL CORD STIMULATOR INSERTION;  Surgeon: [name], MD;  Location: [facility];  Service: Neurosurgery;  Laterality: [name];  . 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  R big toe surg due to osteoarth  . TONSILLECTOMY    . traeculectomy Left    eye  . TRANSCATHETER AORTIC VALVE REPLACEMENT, TRANSFEMORAL  04/11/2016  . TRANSESOPHAGEAL ECHOCARDIOGRAM  03/09/2016   [facility]: EF 55-60%, PFO seen with bi-directional shunting, no thrombus in appendage.  . TRANSTHORACIC ECHOCARDIOGRAM  01/2015; 01/2016; 05/18/16; 09/18/16, 05/2017, 08/2017   01/2015 No signif change in aortic stenosis (moderate).  01/2016 Severe LVH  w/small LV cavity, EF 60-65%, grade I diast dysfxn.  05/2016 (s/p TAVR): EF 50-55%, grd I DD, biopros AV good.  08/2016--EF 50-55%, LV septal motion c/w conduction abnl, grd I DD,mild MS,bioprosth aortic valve well seated, w/trace AR. 05/2017 TTE EF 35%. 08/2017-EF 35%, mod diff hypokin LV, grd I DD, biopros AV good.   . TRANSTHORACIC ECHOCARDIOGRAM     04/2018: EF 40-45%, mod diffuse LV hypokinesis, grd I DD, bioprosth AV well seated, no AS or AR.  [name] VITRECTOMY     Social History   Occupational History  . Occupation: Engineer, production    Comment: retired  Tobacco Use  . Smoking status: Never Smoker  . Smokeless tobacco: Never Used  Substance and Sexual Activity  . Alcohol use: No    Alcohol/week: 0.0 standard drinks  . Drug use: No    Types: Oxycodone  . Sexual activity: Not Currently

## 2019-09-05 ENCOUNTER — Telehealth: Payer: Self-pay

## 2019-09-05 ENCOUNTER — Other Ambulatory Visit: Payer: Self-pay

## 2019-09-05 ENCOUNTER — Other Ambulatory Visit: Payer: Self-pay | Admitting: Family Medicine

## 2019-09-05 MED ORDER — FINASTERIDE 5 MG PO TABS: 5.0000 mg | ORAL_TABLET | Freq: Every day | ORAL | 0 refills | Status: DC

## 2019-09-05 NOTE — Telephone Encounter (Signed)
Rx sent. Left detailed message, okay per dpr

## 2019-09-05 NOTE — Telephone Encounter (Signed)
Patient request finasteride (PROSCAR) 5 MG tablet sent to [facility]

## 2019-09-08 ENCOUNTER — Inpatient Hospital Stay: Payer: Medicare Other | Admitting: Orthopedic Surgery

## 2019-09-09 ENCOUNTER — Other Ambulatory Visit: Payer: Self-pay

## 2019-09-09 ENCOUNTER — Encounter (HOSPITAL_COMMUNITY): Payer: Self-pay | Admitting: Emergency Medicine

## 2019-09-09 ENCOUNTER — Emergency Department (HOSPITAL_COMMUNITY)
Admission: EM | Admit: 2019-09-09 | Discharge: 2019-09-09 | Disposition: A | Discharge status: Home / Self Care | Payer: Medicare Other | Source: Home / Self Care | Attending: Emergency Medicine | Admitting: Emergency Medicine

## 2019-09-09 ENCOUNTER — Emergency Department (HOSPITAL_COMMUNITY): Payer: Medicare Other

## 2019-09-09 DIAGNOSIS — R339 Retention of urine, unspecified: Secondary | ICD-10-CM | POA: Insufficient documentation

## 2019-09-09 DIAGNOSIS — N401 Enlarged prostate with lower urinary tract symptoms: Secondary | ICD-10-CM | POA: Insufficient documentation

## 2019-09-09 DIAGNOSIS — N471 Phimosis: Secondary | ICD-10-CM | POA: Insufficient documentation

## 2019-09-09 DIAGNOSIS — Z79899 Other long term (current) drug therapy: Secondary | ICD-10-CM | POA: Insufficient documentation

## 2019-09-09 DIAGNOSIS — I2510 Atherosclerotic heart disease of native coronary artery without angina pectoris: Secondary | ICD-10-CM | POA: Insufficient documentation

## 2019-09-09 DIAGNOSIS — J449 Chronic obstructive pulmonary disease, unspecified: Secondary | ICD-10-CM | POA: Insufficient documentation

## 2019-09-09 DIAGNOSIS — I110 Hypertensive heart disease with heart failure: Secondary | ICD-10-CM | POA: Insufficient documentation

## 2019-09-09 DIAGNOSIS — T83518A Infection and inflammatory reaction due to other urinary catheter, initial encounter: Secondary | ICD-10-CM | POA: Diagnosis not present

## 2019-09-09 DIAGNOSIS — Z950 Presence of cardiac pacemaker: Secondary | ICD-10-CM | POA: Insufficient documentation

## 2019-09-09 DIAGNOSIS — Z7901 Long term (current) use of anticoagulants: Secondary | ICD-10-CM | POA: Insufficient documentation

## 2019-09-09 DIAGNOSIS — N481 Balanitis: Secondary | ICD-10-CM | POA: Insufficient documentation

## 2019-09-09 DIAGNOSIS — A419 Sepsis, unspecified organism: Secondary | ICD-10-CM | POA: Diagnosis not present

## 2019-09-09 DIAGNOSIS — I5042 Chronic combined systolic (congestive) and diastolic (congestive) heart failure: Secondary | ICD-10-CM | POA: Insufficient documentation

## 2019-09-09 DIAGNOSIS — Z95810 Presence of automatic (implantable) cardiac defibrillator: Secondary | ICD-10-CM | POA: Insufficient documentation

## 2019-09-09 LAB — URINALYSIS, ROUTINE W REFLEX MICROSCOPIC
Bilirubin Urine: NEGATIVE
Glucose, UA: NEGATIVE mg/dL
Ketones, ur: NEGATIVE mg/dL
Nitrite: NEGATIVE
Protein, ur: 100 mg/dL — AB
RBC / HPF: >50 RBC/hpf — ABNORMAL HIGH (ref 0–5)
Specific Gravity, Urine: 1.013 (ref 1.005–1.030)
WBC, UA: >50 WBC/hpf — ABNORMAL HIGH (ref 0–5)
pH: 6 (ref 5.0–8.0)

## 2019-09-09 LAB — BASIC METABOLIC PANEL
Anion gap: 10 (ref 5–15)
BUN: 15 mg/dL (ref 8–23)
CO2: 22 mmol/L (ref 22–32)
Calcium: 8.9 mg/dL (ref 8.9–10.3)
Chloride: 107 mmol/L (ref 98–111)
Creatinine, Ser: 1.07 mg/dL (ref 0.61–1.24)
GFR calc Af Amer: >60 mL/min (ref >60)
GFR calc non Af Amer: >60 mL/min (ref >60)
Glucose, Bld: 158 mg/dL — ABNORMAL HIGH (ref 70–99)
Potassium: 4.4 mmol/L (ref 3.5–5.1)
Sodium: 139 mmol/L (ref 135–145)

## 2019-09-09 LAB — CBC
HCT: 38.9 % — ABNORMAL LOW (ref 39.0–52.0)
Hemoglobin: 12.4 g/dL — ABNORMAL LOW (ref 13.0–17.0)
MCH: 30 pg (ref 26.0–34.0)
MCHC: 31.9 g/dL (ref 30.0–36.0)
MCV: 94 fL (ref 80.0–100.0)
Platelets: 155 10*3/uL (ref 150–400)
RBC: 4.14 MIL/uL — ABNORMAL LOW (ref 4.22–5.81)
RDW: 14.6 % (ref 11.5–15.5)
WBC: 9.4 10*3/uL (ref 4.0–10.5)
nRBC: 0 % (ref 0.0–0.2)

## 2019-09-09 MED ORDER — ONDANSETRON HCL 4 MG/2ML IJ SOLN
4.0000 mg | Freq: Once | INTRAMUSCULAR | Status: AC
  Administered 2019-09-09: 4 mg via INTRAVENOUS
  Filled 2019-09-09: qty 2

## 2019-09-09 MED ORDER — DICYCLOMINE HCL 10 MG/ML IM SOLN
20.0000 mg | Freq: Once | INTRAMUSCULAR | Status: AC
  Administered 2019-09-09: 20 mg via INTRAMUSCULAR
  Filled 2019-09-09: qty 2

## 2019-09-09 MED ORDER — HYDROMORPHONE HCL 1 MG/ML IJ SOLN
0.5000 mg | Freq: Once | INTRAMUSCULAR | Status: AC
  Administered 2019-09-09: 0.5 mg via INTRAVENOUS
  Filled 2019-09-09: qty 1

## 2019-09-09 MED ORDER — FLUCONAZOLE 100 MG PO TABS: 100.0000 mg | ORAL_TABLET | Freq: Two times a day (BID) | ORAL | 0 refills | Status: DC

## 2019-09-09 MED ORDER — NYSTATIN 100000 UNIT/GM EX POWD: 1.0000 "application " | Freq: Three times a day (TID) | CUTANEOUS | 0 refills | Status: DC

## 2019-09-09 MED ORDER — FLUCONAZOLE 200 MG PO TABS
200.0000 mg | ORAL_TABLET | Freq: Once | ORAL | Status: AC
  Administered 2019-09-09: 200 mg via ORAL
  Filled 2019-09-09: qty 1

## 2019-09-09 MED ORDER — FLUCONAZOLE 200 MG PO TABS: 200.0000 mg | ORAL_TABLET | Freq: Every day | ORAL | 0 refills | Status: DC

## 2019-09-09 MED ORDER — FLUCONAZOLE 100 MG PO TABS: 100.0000 mg | ORAL_TABLET | Freq: Once | ORAL | Status: DC

## 2019-09-09 MED ORDER — MORPHINE SULFATE (PF) 4 MG/ML IV SOLN: 4.0000 mg | Freq: Once | INTRAVENOUS | Status: DC

## 2019-09-09 MED ORDER — IOHEXOL 300 MG/ML  SOLN
100.0000 mL | Freq: Once | INTRAMUSCULAR | Status: AC | PRN
  Administered 2019-09-09: 100 mL via INTRAVENOUS

## 2019-09-09 MED ORDER — HYDROMORPHONE HCL 1 MG/ML IJ SOLN
1.0000 mg | Freq: Once | INTRAMUSCULAR | Status: AC
  Administered 2019-09-09: 1 mg via INTRAVENOUS
  Filled 2019-09-09: qty 1

## 2019-09-09 MED ORDER — NYSTATIN 100000 UNIT/GM EX POWD
Freq: Once | CUTANEOUS | Status: AC
  Filled 2019-09-09: qty 15

## 2019-09-09 MED ORDER — FENTANYL CITRATE (PF) 100 MCG/2ML IJ SOLN
100.0000 ug | Freq: Once | INTRAMUSCULAR | Status: AC
  Administered 2019-09-09: 100 ug via INTRAVENOUS
  Filled 2019-09-09: qty 2

## 2019-09-09 MED ORDER — LIDOCAINE HCL URETHRAL/MUCOSAL 2 % EX GEL
1.0000 "application " | Freq: Once | CUTANEOUS | Status: AC
  Administered 2019-09-09: 1 via TOPICAL
  Filled 2019-09-09: qty 20

## 2019-09-09 MED ORDER — OXYCODONE-ACETAMINOPHEN 5-325 MG PO TABS
1.0000 | ORAL_TABLET | Freq: Once | ORAL | Status: AC
  Administered 2019-09-09: 1 via ORAL
  Filled 2019-09-09 (×2): qty 1

## 2019-09-09 NOTE — ED Triage Notes (Signed)
Pt added that he has been blacking out when standing and almost passing out.

## 2019-09-09 NOTE — ED Notes (Signed)
D/C in/out cath and foley catheter d/t urology consult

## 2019-09-09 NOTE — ED Notes (Signed)
fluconazole given per [facility]. Name/DOB verified with pt. Pt switched to leg bag. Foley care reinforced

## 2019-09-09 NOTE — ED Notes (Signed)
Pt turned, cleaned, and repositioned. New linens provided

## 2019-09-09 NOTE — ED Notes (Signed)
Patient verbalizes understanding of discharge instructions and foley care. Pt verbalizes understanding of follow up care and prescription medications. Opportunity for questioning and answers were provided. All questions answered completely.  PIV removed, catheter intact. Site dressed with gauze and tape. Armband removed by staff, pt discharged from ED. Wheeled from ED and picked up by wife

## 2019-09-09 NOTE — ED Provider Notes (Signed)
Care assumed from [place] at shift change, please see his note for full details, but in brief Tim [name] is a 79 y.o. male who presents with severe penile pain that began this morning.  Patient is morbidly obese with buried penis with erythema evidence of surrounding candidal infection.  Lab work with hyperglycemia at 158, no other electrolyte derangements and no leukocytosis.  Unable to obtain urinalysis patient has not been able to urinate since 5 AM. CT ordered to rule out deeper space infection or obstructive uropathy, pending at shift change.  Physical Exam  BP 122/65   Pulse 84   Temp 98 F (36.7 C) (Oral)   Resp 14   Ht 6' (1.829 m)   Wt (!) 150.6 kg   SpO2 99%   BMI 45.03 kg/m   Physical Exam Vitals and nursing note reviewed. Exam conducted with a chaperone present.  Constitutional:      General: He is not in acute distress.    Appearance: He is well-developed. He is obese. He is not diaphoretic.     Comments: Morbidly obese, uncomfortable but in no acute distress.  HENT:     Head: Normocephalic and atraumatic.  Eyes:     General:        Right eye: No discharge.        Left eye: No discharge.  Pulmonary:     Effort: Pulmonary effort is normal. No respiratory distress.  Genitourinary:    Comments: On exam patient has erythema and skin breakdown of the scrotum and tissue surrounding the penis.  Patient with buried penis, unable to focally visualize the glans of the penis, there is a small opening it is unclear whether this is an opening in the foreskin or the urethra due to patient's anatomy and obesity.  There is white discharge present concerning for yeast infection.  The area is very tender to palpation.  No tenderness or swelling present over the scrotum. Skin:    General: Skin is warm and dry.  Neurological:     Mental Status: He is alert.     Coordination: Coordination normal.  Psychiatric:        Behavior: Behavior normal.     ED Course/Procedures    Labs Reviewed  BASIC METABOLIC PANEL - Abnormal; Notable for the following components:      Result Value   Glucose, Bld 158 (*)    All other components within normal limits  CBC - Abnormal; Notable for the following components:   RBC 4.14 (*)    Hemoglobin 12.4 (*)    HCT 38.9 (*)    All other components within normal limits  URINALYSIS, ROUTINE W REFLEX MICROSCOPIC - Abnormal; Notable for the following components:   APPearance TURBID (*)    Hgb urine dipstick LARGE (*)    Protein, ur 100 (*)    Leukocytes,Ua MODERATE (*)    RBC / HPF >50 (*)    WBC, UA >50 (*)    Bacteria, UA FEW (*)    All other components within normal limits  URINE CULTURE   CT Abdomen Pelvis W Contrast  Result Date: 09/09/2019 CLINICAL DATA:  Dysuria, acute generalized abdominal pain. EXAM: CT ABDOMEN AND PELVIS WITH CONTRAST TECHNIQUE: Multidetector CT imaging of the abdomen and pelvis was performed using the standard protocol following bolus administration of intravenous contrast. CONTRAST:  156m OMNIPAQUE IOHEXOL 300 MG/ML  SOLN COMPARISON:  None. FINDINGS: Lower chest: No acute abnormality. Hepatobiliary: No focal liver abnormality is seen.  Status post cholecystectomy. No biliary dilatation. Pancreas: Unremarkable. No pancreatic ductal dilatation or surrounding inflammatory changes. Spleen: Normal in size without focal abnormality. Adrenals/Urinary Tract: Adrenal glands are unremarkable. Kidneys are normal, without renal calculi, focal lesion, or hydronephrosis. Bladder is unremarkable. Stomach/Bowel: Stomach is within normal limits. Appendix appears normal. No evidence of bowel wall thickening, distention, or inflammatory changes. Sigmoid diverticulosis is noted without inflammation. Vascular/Lymphatic: Aortic atherosclerosis. No enlarged abdominal or pelvic lymph nodes. Reproductive: Prostate is unremarkable. Other: No abdominal wall hernia or abnormality. No abdominopelvic ascites. Musculoskeletal: No acute or  significant osseous findings. IMPRESSION: 1. Sigmoid diverticulosis without inflammation. 2. Aortic atherosclerosis. 3. No acute abnormality seen in the abdomen or pelvis. Aortic Atherosclerosis (ICD10-I70.0). Electronically Signed   By: Marijo Conception M.D.   On: 09/09/2019 16:13    Procedures  MDM   Patient with penile pain and evidence of likely candidal balanitis, patient has been unable to urinate throughout his ED stay.  CT scan with enlarged prostate but no evidence of obstructive uropathy, distended bladder noted on CT.  Pain medicine given to help with discomfort.   Nursing staff made multiple attempts to place catheter for urine, feel that patient would benefit from Foley catheter as I think balanitis is so severe that he will not be able to urinate until inflammation improves.  Nursing staff also tried coud.  Will consult urology.  Case discussed with Dr. Tresa Moore with urology who will come and see patient in place Foley request that urology cart be placed at bedside.  Discussed this plan with the patient who is in agreement.  Dr. Tresa Moore at bedside and after using flexible cystoscope able to visualize meatus in place 18 [place] catheter with bloody urine returned.  Patient is on Eliquis but bleeding is controlled he is asked the patient to hold his Eliquis over the next few days which he thinks is reasonable as he is only on this for prevention, no recent stroke or cardiac stent placement.  Patient has a buried penis with severe phimosis and will need elective redo circumcision, but in the meantime would like patient to be treated with 7 days of p.o. Diflucan for fungal balanitis, will leave Foley in place and he will arrange outpatient urology follow-up for the patient.  Patient reports now that Foley is in place his pain is significantly improved.  Diflucan prescribed and first dose given here in the ED we will also prescribe nystatin powder for surrounding erythematous skin  breakdown to help reduce moisture.   Called to bedside by nursing staff for some bleeding around the Foley, this has already clotted off there is no active bleeding, blood cleaned away.  Patient provided with gauze, and instructed that if he notes any bleeding around the catheter site to apply gauze and hold pressure.  This should improve as patient holds his blood thinner.  I discussed appropriate catheter care and strict return precautions with the patient he expresses understanding and agreement with plan.  Discharged home in good condition.  Final diagnoses:  Phimosis  Balanitis  Urinary retention    ED Discharge Orders         Ordered    fluconazole (DIFLUCAN) 100 MG tablet  2 times daily,   Status:  Discontinued     09/09/19 2029    nystatin (MYCOSTATIN/NYSTOP) powder  3 times daily     09/09/19 2029    fluconazole (DIFLUCAN) 200 MG tablet  Daily     09/09/19 2057  Jacqlyn Larsen, PA-C 09/10/19 0015    Quintella Reichert, MD 09/12/19 [phone]

## 2019-09-09 NOTE — ED Notes (Addendum)
Urology at bedside, able to insert coude foley. 200 cc Bloody urine output noted.

## 2019-09-09 NOTE — ED Provider Notes (Signed)
[facility] EMERGENCY DEPARTMENT Provider Note   CSN: [id number] Arrival date & time: 09/09/19  1038     History Chief Complaint  Patient presents with  . Urinary Tract Infection    Tim [name] is a 79 y.o. male.  HPI Patient presents to the emergency department with pain in the penis and groin region.  The patient states that it started at around 5 this morning but got worse throughout the day.  The patient states that movement and palpation makes the pain worse.  Patient states that urination is very uncomfortable.  The patient states that he has to wear diapers due to incontinence issues.  The patient denies chest pain, shortness of breath, headache,blurred vision, neck pain, fever, cough, weakness, numbness, dizziness, anorexia, edema, abdominal pain, nausea, vomiting, diarrhea, rash, back pain, dysuria, hematemesis, bloody stool, near syncope, or syncope.    Past Medical History:  Diagnosis Date  . AICD (automatic cardioverter/defibrillator) present   . ASTHMA   . Asthma    as a child  . BPH (benign prostatic hypertrophy)   . CAD (coronary artery disease)    Nonobstructive by 12/2012 cath; then 03/2016 he required BMS to RCA ([facility]).  In-stent restenosis on cath 11/01/16, baloon angioplasty successful--pt to take plavix (no ASA), + eliquis (for PAF).  [name] CATARACT, HX OF   . Chronic combined systolic and diastolic CHF (congestive heart failure) ([place]) 05/2016   Ischemic CM.  04/2018 EF 40-45%, grd I DD.  [name] Chronic pain syndrome    Lumbar DDD; chronic neuropathic pain (DM); has spinal stimulator and sees pain mgmt MD  . COPD   . Diabetes mellitus type 2 with complications (HCC)    NWG9F jumped from 5.7% to 6.1% 03/2015---started metformin at that time.  DM 2 dx by fasting gluc criteria 2018.  Has chronic neuropath pain  . Elevated transaminase level 02/2016   Suspect due to fatty liver (documented on u/s 2007). Plan repeat labs 03/2016.  [name] Essential  hypertension, benign   . Fatty liver 2007   2007 u/s showed fatty liver with hepatosplenomegaly.  2019 repeat u/s->fatty liver but no cirrhosis or hepatosplenomegaly.  [name] GERD (gastroesophageal reflux disease)   . Glaucoma   . GOUT   . Hallux valgus (acquired)   . HH (hiatus hernia)   . HYPERCHOLESTEROLEMIA-PURE   . Hypogonadism male   . Lumbosacral neuritis   . Lumbosacral spondylosis    Lumbar spinal stenosis with neurogenic claudication--contributes to his chronic pain syndrome  . Normal memory function 08/2014   Neuropsychological testing ([facility]): no cognitive impairment or sign of neurodegenerative disorder.  Likely has adjustment d/o with mixed anxiety/depressed features and may benefit from low dose SNRI.    [name] Normocytic anemia 03/2016   Mild-pt needs ferritin and vit B12 level checked (as of 03/22/16)  . NSTEMI (non-ST elevated myocardial infarction) ([place]) 03/20/2016   BMS to RCA  . Obesity hypoventilation syndrome ([place])   . Obesity, unspecified   . Orthostatic hypotension   . OSA on CPAP    8 cm H2O  . OSTEOARTHRITIS   . Paroxysmal atrial fibrillation ([place]) 2003    (? chronic?) Off anticoag for a while due to falls.  Then apixaban started 12/2014.  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 Ella Bodo): neuropathic pain->diff to treat, failed nucynta, failed spinal stimul trial, oxycontin hs + tramadol + gabap as of 12/2017 f/u Dr. Letta Pate.  . Personal history of colonic adenoma 10/30/2012  Diminutive adenoma, consider repeat 2019 per GI  . Pneumonia   . Presence of cardiac defibrillator 11/07/2017  . Primary osteoarthritis of both knees    Bone on bone of medial compartments, + signif patellofemoral arth bilat.--supartz inj series started 09/12/17  . PUD (peptic ulcer disease)   . PULMONARY HYPERTENSION, HX OF   . Secondary male hypogonadism 2017  . Severe aortic stenosis    by cath by [facility] 03/2016; CT surgery saw  him and did TAVR 04/11/16 ([facility])  . Shortness of breath    with exertion: much improved s/p TAVR and treatment for CHF.  . Sick sinus syndrome (HCC)    PPM placed  . Thrombocytopenia ([place]) 2018   HSM on 2007 abd u/s---suspect some mild splenic sequestration chronically.  [name] Unspecified glaucoma(365.9)   . Unspecified hereditary and idiopathic peripheral neuropathy approx age 64   bilat LE's, ? left arm, too.  Feet became progressively numb + left foot pain intermittently.  Pt may be trying a spinal stimulator (as of 05/2015)  . VENOUS INSUFFICIENCY    Being followed by Dr. Sharol Given as of 10/2016 for two R LL venous stasis ulcers/skin tears.  Healed as of 10/30/16 f/u with Dr. Sharol Given.  . VENTRAL HERNIA     Patient Active Problem List   Diagnosis Date Noted  . Subacute osteomyelitis, left ankle and foot ([place])   . 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 Diabetic neuropathy, painful ([place]) 12/26/2016  . Neuropathic pain of foot, left 06/20/2016  . 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 Status post amputation of left great toe ([place]) 06/08/2015  . Spinal stenosis of lumbar region 02/16/2015  . Hereditary and idiopathic peripheral neuropathy 01/12/2015  . Oral thrush 12/28/2014  . Postural dizziness 05/24/2014  . Paresthesias with subjective weakness 01/30/2014  . Bilateral thigh pain 01/30/2014  . Abdominal wall pain in right upper quadrant 01/22/2014  . Gross hematuria 09/25/2013  . Intertrigo 09/25/2013  . IBS (irritable bowel syndrome) 09/11/2013  . Chronic pain syndrome 08/06/2013  . CAD (coronary artery disease), native coronary artery 12/25/2012  . Paroxysmal atrial fibrillation ([place]) 12/25/2012  . Moderate aortic stenosis 12/25/2012  . Aortic stenosis 12/24/2012  . Personal history of colonic adenoma 10/30/2012  . Diverticulosis of colon (without mention of hemorrhage) 10/30/2012  . Internal hemorrhoids 10/30/2012  .  Change in bowel habits intermittent loose stools 10/30/2012  . Routine health maintenance 06/09/2012  . Hereditary peripheral neuropathy 10/10/2011  . Long term current use of anticoagulant - apixiban 08/16/2010  . Dyspnea on exertion 09/08/2009  . HYPERCHOLESTEROLEMIA-PURE 01/13/2009  . Essential hypertension, benign 01/13/2009  . EDEMA 01/13/2009  . GOUT 01/12/2009  . GLAUCOMA 01/12/2009  . Venous (peripheral) insufficiency 01/12/2009  . COPD (chronic obstructive pulmonary disease) ([place]) 01/12/2009  . VENTRAL HERNIA 01/12/2009  . OSTEOARTHRITIS 01/12/2009  . ARTHRITIS 01/12/2009  . DEGENERATIVE DISC DISEASE 01/12/2009  . CATARACT, HX OF 01/12/2009  . HEART MURMUR, HX OF 01/12/2009  . BENIGN PROSTATIC HYPERTROPHY, HX OF 01/12/2009  . Obesity 01/17/2007  . OBSTRUCTIVE SLEEP APNEA 01/17/2007  . ASTHMA 01/17/2007  . GERD 01/17/2007  . HALLUX VALGUS, ACQUIRED 01/17/2007  . PULMONARY HYPERTENSION, HX OF 01/17/2007    Past Surgical History:  Procedure Laterality Date  . AMPUTATION Left 04/11/2013   Procedure: AMPUTATION DIGIT Left 3rd toe;  Surgeon: [name], MD;  Location: [facility];  Service: Orthopedics;  Laterality: Left;  Left 3rd toe amputation at [facility]  .  AMPUTATION Left 08/29/2019   Procedure: LEFT TRANSMETATARSAL AMPUTATION;  Surgeon: [name], MD;  Location: [facility];  Service: Orthopedics;  Laterality: Left;  . CARDIAC CATHETERIZATION  1997; 03/10/16   1997 Non-obstructive disease.  03/2016 BMS to RCA, with 25% pDiag dz, o/w normal cors per cath 03/07/16.  Cath 11/01/16: in stent restenosis, successful baloon angioplasty.  [name] CARDIAC CATHETERIZATION  12/24/2012   mild < 20% LCx, prox 30% RCA; LVEF 55-65% , moderate pulmonary HTN, moderate AS  . CARDIAC DEFIBRILLATOR PLACEMENT  11/07/2017   [facility] MRI Quad CRT defibrillator  . CARDIOVASCULAR STRESS TEST  05/11/16 ([facility])   Myocardial perfusion imaging:  No ischemia; scar in apex, global hypokinesis, EF 36%.  . Carotid dopplers   03/09/2016   [facility]: no hemodynamically significant stenosis on either side.  . CHOLECYSTECTOMY    . COLONOSCOPY    . COLONOSCOPY [name] 10/30/2012   Procedure: COLONOSCOPY;  Surgeon: [name], MD;  Location: [facility];  Service: Endoscopy;  Laterality: [name];  . CORONARY ANGIOPLASTY WITH STENT PLACEMENT  03/2016; 04/2017   2017-[facility]: BMS to RCA-pt was placed on Brilinta.  04/2017: DES to RCA.  [name] EYE SURGERY Bilateral cataract  . HEMORRHOID SURGERY    . INTRAOCULAR LENS INSERTION Bilateral   . KNEE SURGERY Right   . LEFT AND RIGHT HEART CATHETERIZATION WITH CORONARY ANGIOGRAM [name] 12/24/2012   Procedure: LEFT AND RIGHT HEART CATHETERIZATION WITH CORONARY ANGIOGRAM;  Surgeon: [name], MD;  [facility];  Service: Cardiovascular;  Laterality: [name];  . LEG SURGERY Bilateral    lenghtening   . PACEMAKER PLACEMENT  04/13/2016   2nd deg HB after TAVR, pt had DC MDT PPM placed.  [name] SHOULDER ARTHROSCOPY  08/30/2011   Procedure: ARTHROSCOPY SHOULDER;  Surgeon: [name], MD;  Location: [facility];  Service: Orthopedics;  Laterality: Right;  Right Shoulder Arthroscopy, Debridement, and Decompression  . SPINAL CORD STIMULATOR INSERTION [name] 09/10/2015   Procedure: LUMBAR SPINAL CORD STIMULATOR INSERTION;  Surgeon: [name], MD;  Location: [facility];  Service: Neurosurgery;  Laterality: [name];  . 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  R big toe surg due to osteoarth  . TONSILLECTOMY    . traeculectomy Left    eye  . TRANSCATHETER AORTIC VALVE REPLACEMENT, TRANSFEMORAL  04/11/2016  . TRANSESOPHAGEAL ECHOCARDIOGRAM  03/09/2016   [facility]: EF 55-60%, PFO seen with bi-directional shunting, no thrombus in appendage.  . TRANSTHORACIC ECHOCARDIOGRAM  01/2015; 01/2016; 05/18/16; 09/18/16, 05/2017, 08/2017   01/2015 No signif change in aortic stenosis (moderate).  01/2016 Severe LVH w/small LV cavity, EF 60-65%, grade I diast dysfxn.  05/2016 (s/p TAVR): EF 50-55%, grd I DD, biopros AV  good.  08/2016--EF 50-55%, LV septal motion c/w conduction abnl, grd I DD,mild MS,bioprosth aortic valve well seated, w/trace AR. 05/2017 TTE EF 35%. 08/2017-EF 35%, mod diff hypokin LV, grd I DD, biopros AV good.   . TRANSTHORACIC ECHOCARDIOGRAM     04/2018: EF 40-45%, mod diffuse LV hypokinesis, grd I DD, bioprosth AV well seated, no AS or AR.  [name] VITRECTOMY         Family History  Problem Relation Age of Onset  . Hypertension Mother   . Coronary artery disease Mother   . Heart attack Mother   . Neuropathy Mother   . Pulmonary fibrosis Father        asbestosis    Social History   Tobacco Use  . Smoking status: Never Smoker  .  Smokeless tobacco: Never Used  Substance Use Topics  . Alcohol use: No    Alcohol/week: 0.0 standard drinks  . Drug use: No    Types: Oxycodone    Home Medications Prior to Admission medications   Medication Sig Start Date End Date Taking? Authorizing Provider  albuterol (PROVENTIL HFA;VENTOLIN HFA) 108 (90 BASE) MCG/ACT inhaler Inhale 2 puffs into the lungs 4 (four) times daily as needed for wheezing or shortness of breath.   Yes [provider]  allopurinol (ZYLOPRIM) 300 MG tablet TAKE 1 TABLET BY MOUTH ONCE DAILY WITH FOOD Patient taking differently: Take 300 mg by mouth daily.  06/12/19  Yes McGowen, Adrian Blackwater, MD  apixaban (ELIQUIS) 5 MG TABS tablet Take 1 tablet (5 mg total) by mouth 2 (two) times daily. 01/02/17  Yes Lelon Perla, MD  ASPIRIN LOW DOSE 81 MG EC tablet Take 81 mg by mouth daily. 02/03/19  Yes [provider]  B Complex-C (B-COMPLEX WITH VITAMIN C) tablet Take 1 tablet by mouth daily. Take 1 tablet daily, 247m   Yes [provider]  cyclobenzaprine (FLEXERIL) 5 MG tablet Take 0.5 tablet to 1 tablet at bedtime  for muscle spasm. 10/21/18  Yes TBayard Hugger NP  ezetimibe (ZETIA) 10 MG tablet take 1 tablet by mouth once daily 01/18/17  Yes Crenshaw, BDenice Bors MD  finasteride (PROSCAR) 5 MG tablet TAKE 1  TABLET BY MOUTH EVERY DAY Patient taking differently: Take 5 mg by mouth daily.  09/08/19  Yes McGowen, PAdrian Blackwater MD  fluticasone (FLONASE) 50 MCG/ACT nasal spray Place 2 sprays into both nostrils as needed for allergies or rhinitis.   Yes [provider]  gabapentin (NEURONTIN) 800 MG tablet TAKE 1 TABLET (800 MG TOTAL) BY MOUTH 4 (FOUR) TIMES DAILY. 04/21/19  Yes Kirsteins, ALuanna Salk MD  isosorbide mononitrate (IMDUR) 60 MG 24 hr tablet TAKE 1 TABLET BY MOUTH EVERY DAY Patient taking differently: Take 60 mg by mouth daily.  08/28/19  Yes McGowen, PAdrian Blackwater MD  metoprolol succinate (TOPROL-XL) 25 MG 24 hr tablet Take 25 mg by mouth daily. Along w/ 545mto equal 7532maily   Yes [provider]  metoprolol succinate (TOPROL-XL) 50 MG 24 hr tablet Take 50 mg by mouth daily. Take along with the 17m20mblet to make total dose of 75mg27mes [provider]  Multiple Vitamin (MULTIVITAMIN) capsule Take 1 capsule by mouth daily.   Yes [provider]  niacin (NIASPAN) 1000 MG CR tablet TAKE 1 TABLET BY MOUTH TWICE A DAY Patient taking differently: Take 1,000 mg by mouth 2 (two) times daily.  04/21/19  Yes McGowen, PhiliAdrian Blackwater nitroGLYCERIN (NITROSTAT) 0.4 MG SL tablet Place 0.4 mg under the tongue every 5 (five) minutes as needed for chest pain.  09/04/17  Yes [provider]  omeprazole (PRILOSEC) 10 MG capsule TAKE 1 CAPSULE BY MOUTH EVERY DAY Patient taking differently: Take 10 mg by mouth daily.  05/05/19  Yes McGowen, PhiliAdrian Blackwater rosuvastatin (CRESTOR) 40 MG tablet take 1 tablet by mouth once daily 02/05/17  Yes Crenshaw, BrianDenice Bors sacubitril-valsartan (ENTRESTO) 24-26 MG Take 1 tablet by mouth 2 (two) times daily. 07/19/17  Yes [provider]  VITAMIN D, CHOLECALCIFEROL, PO Take 1 tablet by mouth daily.    Yes [provider]  cadexomer iodine (IODOSORB) 0.9 % gel Apply 1 application topically daily as needed for wound care. Apply to the  affected area daily  plus dry dressing Patient not taking: Reported on 09/09/2019 08/19/19   Persons, Bevely Palmer, [place]  clotrimazole-betamethasone (LOTRISONE) cream apply to affected area twice a day Patient not taking: No sig reported 11/04/15   McGowen, Adrian Blackwater, MD  doxycycline (VIBRAMYCIN) 100 MG capsule Take 1 capsule (100 mg total) by mouth 2 (two) times daily. Patient not taking: Reported on 09/09/2019 08/14/19   Orpah Greek, MD  finasteride (PROSCAR) 5 MG tablet Take 1 tablet (5 mg total) by mouth daily. Patient not taking: Reported on 09/09/2019 09/05/19   Tammi Sou, MD  KLOR-CON M20 20 MEQ tablet TAKE 1 TABLET BY MOUTH EVERY DAY Patient not taking: Reported on 09/09/2019 09/08/19   Tammi Sou, MD  oxyCODONE-acetaminophen (PERCOCET) 5-325 MG tablet Take 1 tablet by mouth every 4 (four) hours as needed. Patient not taking: Reported on 09/09/2019 08/29/19 08/28/20  Persons, Bevely Palmer, [place]    Allergies    Brimonidine tartrate, Brimonidine tartrate, Brinzolamide, Latanoprost, Nucynta [tapentadol], Sulfa antibiotics, Timolol maleate, Cardizem  [diltiazem hcl], Diltiazem, Rofecoxib, Vancomycin, Codeine, Celecoxib, Colchicine, and Tape  Review of Systems   Review of Systems All other systems negative except as documented in the HPI. All pertinent positives and negatives as reviewed in the HPI. Physical Exam Updated Vital Signs BP 122/65   Pulse 84   Temp 98 F (36.7 C) (Oral)   Resp 14   Ht 6' (1.829 m)   Wt (!) 150.6 kg   SpO2 99%   BMI 45.03 kg/m   Physical Exam Vitals and nursing note reviewed.  Constitutional:      General: He is not in acute distress.    Appearance: He is well-developed.  HENT:     Head: Normocephalic and atraumatic.  Eyes:     Pupils: Pupils are equal, round, and reactive to light.  Cardiovascular:     Rate and Rhythm: Normal rate and regular rhythm.     Heart sounds: Normal heart sounds. No murmur. No friction rub. No gallop.   Pulmonary:      Effort: Pulmonary effort is normal. No respiratory distress.     Breath sounds: Normal breath sounds. No wheezing.  Abdominal:     General: Bowel sounds are normal. There is no distension.     Palpations: Abdomen is soft.     Tenderness: There is no abdominal tenderness.  Genitourinary:   Musculoskeletal:     Cervical back: Normal range of motion and neck supple.  Skin:    General: Skin is warm and dry.     Capillary Refill: Capillary refill takes less than 2 seconds.     Findings: No erythema or rash.  Neurological:     Mental Status: He is alert and oriented to person, place, and time.     Motor: No abnormal muscle tone.     Coordination: Coordination normal.  Psychiatric:        Behavior: Behavior normal.     ED Results / Procedures / Treatments   Labs (all labs ordered are listed, but only abnormal results are displayed) Labs Reviewed  BASIC METABOLIC PANEL - Abnormal; Notable for the following components:      Result Value   Glucose, Bld 158 (*)    All other components within normal limits  CBC - Abnormal; Notable for the following components:   RBC 4.14 (*)    Hemoglobin 12.4 (*)    HCT 38.9 (*)    All other components within normal limits  URINALYSIS, ROUTINE W REFLEX  MICROSCOPIC    EKG EKG Interpretation  Date/Time:  Tuesday September 09 2019 10:49:20 EST Ventricular Rate:  86 PR Interval:  144 QRS Duration: 146 QT Interval:  392 QTC Calculation: 469 R Axis:   -76 Text Interpretation: Atrial-sensed ventricular-paced rhythm Biventricular pacemaker detected Abnormal ECG Confirmed by Pattricia Boss [phone]) on 09/09/2019 12:37:43 PM   Radiology No results found.  Procedures Procedures (including critical care time)  Medications Ordered in ED Medications  nystatin (MYCOSTATIN/NYSTOP) topical powder (has no administration in time range)  lidocaine (XYLOCAINE) 2 % jelly 1 application (has no administration in time range)  ondansetron (ZOFRAN) injection 4 mg  (has no administration in time range)  fentaNYL (SUBLIMAZE) injection 100 mcg (has no administration in time range)  oxyCODONE-acetaminophen (PERCOCET/ROXICET) 5-325 MG per tablet 1 tablet (1 tablet Oral Given 09/09/19 1418)  iohexol (OMNIPAQUE) 300 MG/ML solution 100 mL (100 mLs Intravenous Contrast Given 09/09/19 1541)    ED Course  I have reviewed the triage vital signs and the nursing notes.  Pertinent labs & imaging results that were available during my care of the patient were reviewed by me and considered in my medical decision making (see chart for details).    MDM Rules/Calculators/A&P                      Patient will have CT scan imaging to further assess this area due to the fact that he is obese and does have diabetes to ensure there is no necrotizing process causing any issue.  There is a lot of irritation of the skin at the outside of the penile region. Final Clinical Impression(s) / ED Diagnoses Final diagnoses:  None    Rx / DC Orders ED Discharge Orders    None       Dalia Heading, PA-C 09/09/19 1616    Pattricia Boss, MD 09/10/19 1120

## 2019-09-09 NOTE — Consult Note (Addendum)
Reason for Consult:Severe Phimosis with Balanitis, Urinary Retention,  Referring Physician: Quintella Reichert MD  Tim [name] is an 79 y.o. male.   HPI:   1 - Severe Phimosis with Balanitis - morbidly obese borderline diabetic with h/o curc many years ago, but buried penis and non-visible glans x years. Dribbles urine into diapers at baseline. New skin erythema and groin-penile pain 3/20201.  2 - Urinary Retention / Large Prostate - Small dribbling urine x mos 09/2019. 100gm prostate by ellipsoid calculation 2021. 456m residual.   PMH sig for morbid obesity, borederline DM2, toe and partial foot amputation, HTN, Arrythmia / Defibrillator / Eliquus (follows Dr TRadford Paxwith [facility]). He is retired eArt gallery manager/ pScientist, research (physical sciences)having studies at [employer] Most of career with [place] then finally [place] before retirement. His PCP is Dr. MAnitra Lauth  Today "Tim [name] is seen for urgent consultation for above. No fevers. CT with large prostate and distended bladder. UA without significant infectious parameters.   Past Medical History:  Diagnosis Date  . AICD (automatic cardioverter/defibrillator) present   . ASTHMA   . Asthma    as a child  . BPH (benign prostatic hypertrophy)   . CAD (coronary artery disease)    Nonobstructive by 12/2012 cath; then 03/2016 he required BMS to RCA ([facility]).  In-stent restenosis on cath 11/01/16, baloon angioplasty successful--pt to take plavix (no ASA), + eliquis (for PAF).  .Marland KitchenCATARACT, HX OF   . Chronic combined systolic and diastolic CHF (congestive heart failure) ([place] 05/2016   Ischemic CM.  04/2018 EF 40-45%, grd I DD.  .Marland KitchenChronic pain syndrome    Lumbar DDD; chronic neuropathic pain (DM); has spinal stimulator and sees pain mgmt MD  . COPD   . Diabetes mellitus type 2 with complications (HCC)    [id number] from 5.7% to 6.1% 03/2015---started metformin at that time.  DM 2 dx by fasting gluc criteria 2018.  Has chronic  neuropath pain  . Elevated transaminase level 02/2016   Suspect due to fatty liver (documented on u/s 2007). Plan repeat labs 03/2016.  .Marland KitchenEssential hypertension, benign   . Fatty liver 2007   2007 u/s showed fatty liver with hepatosplenomegaly.  2019 repeat u/s->fatty liver but no cirrhosis or hepatosplenomegaly.  .Marland KitchenGERD (gastroesophageal reflux disease)   . Glaucoma   . GOUT   . Hallux valgus (acquired)   . HH (hiatus hernia)   . HYPERCHOLESTEROLEMIA-PURE   . Hypogonadism male   . Lumbosacral neuritis   . Lumbosacral spondylosis    Lumbar spinal stenosis with neurogenic claudication--contributes to his chronic pain syndrome  . Normal memory function 08/2014   Neuropsychological testing ([facility]): no cognitive impairment or sign of neurodegenerative disorder.  Likely has adjustment d/o with mixed anxiety/depressed features and may benefit from low dose SNRI.    .Marland KitchenNormocytic anemia 03/2016   Mild-pt needs ferritin and vit B12 level checked (as of 03/22/16)  . NSTEMI (non-ST elevated myocardial infarction) ([place] 03/20/2016   BMS to RCA  . Obesity hypoventilation syndrome ([place]   . Obesity, unspecified   . Orthostatic hypotension   . OSA on CPAP    8 cm H2O  . OSTEOARTHRITIS   . Paroxysmal atrial fibrillation ([place] 2003    (? chronic?) Off anticoag for a while due to falls.  Then apixaban started 12/2014.  .Marland KitchenPeripheral neuropathy    DPN (+[place]; with chronic neuropathic pain--Dr. KElla Bodo: neuropathic pain->diff to treat, failed nucynta, failed spinal stimul trial,  oxycontin hs + tramadol + gabap as of 12/2017 f/u Dr. Letta Pate.  . Personal history of colonic adenoma 10/30/2012   Diminutive adenoma, consider repeat 2019 per GI  . Pneumonia   . Presence of cardiac defibrillator 11/07/2017  . Primary osteoarthritis of both knees    Bone on bone of medial compartments, + signif patellofemoral arth bilat.--supartz inj series started 09/12/17  . PUD (peptic ulcer  disease)   . PULMONARY HYPERTENSION, HX OF   . Secondary male hypogonadism 2017  . 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 Shortness of breath    with exertion: much improved s/p TAVR and treatment for CHF.  . Sick sinus syndrome (HCC)    PPM placed  . Thrombocytopenia ([place]) 2018   HSM on 2007 abd u/s---suspect some mild splenic sequestration chronically.  [name] Unspecified glaucoma(365.9)   . Unspecified hereditary and idiopathic peripheral neuropathy approx age 40   bilat LE's, ? left arm, too.  Feet became progressively numb + left foot pain intermittently.  Pt may be trying a spinal stimulator (as of 05/2015)  . VENOUS INSUFFICIENCY    Being followed by Dr. Sharol Given as of 10/2016 for two R LL venous stasis ulcers/skin tears.  Healed as of 10/30/16 f/u with Dr. Sharol Given.  . VENTRAL HERNIA     Past Surgical History:  Procedure Laterality Date  . AMPUTATION Left 04/11/2013   Procedure: AMPUTATION DIGIT Left 3rd toe;  Surgeon: [name], MD;  Location: [facility];  Service: Orthopedics;  Laterality: Left;  Left 3rd toe amputation at [facility]  . AMPUTATION Left 08/29/2019   Procedure: LEFT TRANSMETATARSAL AMPUTATION;  Surgeon: [name], MD;  Location: [facility];  Service: Orthopedics;  Laterality: Left;  . CARDIAC CATHETERIZATION  1997; 03/10/16   1997 Non-obstructive disease.  03/2016 BMS to RCA, with 25% pDiag dz, o/w normal cors per cath 03/07/16.  Cath 11/01/16: in stent restenosis, successful baloon angioplasty.  [name] CARDIAC CATHETERIZATION  12/24/2012   mild < 20% LCx, prox 30% RCA; LVEF 55-65% , moderate pulmonary HTN, moderate AS  . CARDIAC DEFIBRILLATOR PLACEMENT  11/07/2017   [facility] MRI Quad CRT defibrillator  . CARDIOVASCULAR STRESS TEST  05/11/16 ([facility])   Myocardial perfusion imaging:  No ischemia; scar in apex, global hypokinesis, EF 36%.  . Carotid dopplers  03/09/2016   [facility]: no hemodynamically significant stenosis on either side.   . CHOLECYSTECTOMY    . COLONOSCOPY    . COLONOSCOPY [name] 10/30/2012   Procedure: COLONOSCOPY;  Surgeon: [name], MD;  Location: [facility];  Service: Endoscopy;  Laterality: [name];  . CORONARY ANGIOPLASTY WITH STENT PLACEMENT  03/2016; 04/2017   2017-[facility]: BMS to RCA-pt was placed on Brilinta.  04/2017: DES to RCA.  [name] EYE SURGERY Bilateral cataract  . HEMORRHOID SURGERY    . INTRAOCULAR LENS INSERTION Bilateral   . KNEE SURGERY Right   . LEFT AND RIGHT HEART CATHETERIZATION WITH CORONARY ANGIOGRAM [name] 12/24/2012   Procedure: LEFT AND RIGHT HEART CATHETERIZATION WITH CORONARY ANGIOGRAM;  Surgeon: [name], MD;  Location: [facility] CATH LAB;  Service: Cardiovascular;  Laterality: [name];  . LEG SURGERY Bilateral    lenghtening   . PACEMAKER PLACEMENT  04/13/2016   2nd deg HB after TAVR, pt had DC MDT PPM placed.  [name] SHOULDER ARTHROSCOPY  08/30/2011   Procedure: ARTHROSCOPY SHOULDER;  Surgeon: [name], MD;  Location: [facility];  Service: Orthopedics;  Laterality: Right;  Right Shoulder Arthroscopy, Debridement, and Decompression  . SPINAL CORD STIMULATOR INSERTION [name] 09/10/2015   Procedure: LUMBAR SPINAL CORD STIMULATOR INSERTION;  Surgeon: [name], MD;  Location: [place] NEURO ORS;  Service: Neurosurgery;  Laterality: [name];  . 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  R big toe surg due to osteoarth  . TONSILLECTOMY    . traeculectomy Left    eye  . TRANSCATHETER AORTIC VALVE REPLACEMENT, TRANSFEMORAL  04/11/2016  . TRANSESOPHAGEAL ECHOCARDIOGRAM  03/09/2016   [facility]: EF 55-60%, PFO seen with bi-directional shunting, no thrombus in appendage.  . TRANSTHORACIC ECHOCARDIOGRAM  01/2015; 01/2016; 05/18/16; 09/18/16, 05/2017, 08/2017   01/2015 No signif change in aortic stenosis (moderate).  01/2016 Severe LVH w/small LV cavity, EF 60-65%, grade I diast dysfxn.  05/2016 (s/p TAVR): EF 50-55%, grd I DD, biopros AV good.  08/2016--EF 50-55%, LV septal motion c/w conduction abnl, grd I DD,mild  MS,bioprosth aortic valve well seated, w/trace AR. 05/2017 TTE EF 35%. 08/2017-EF 35%, mod diff hypokin LV, grd I DD, biopros AV good.   . TRANSTHORACIC ECHOCARDIOGRAM     04/2018: EF 40-45%, mod diffuse LV hypokinesis, grd I DD, bioprosth AV well seated, no AS or AR.  [name] VITRECTOMY      Family History  Problem Relation Age of Onset  . Hypertension Mother   . Coronary artery disease Mother   . Heart attack Mother   . Neuropathy Mother   . Pulmonary fibrosis Father        asbestosis    Social History:  reports that he has never smoked. He has never used smokeless tobacco. He reports that he does not drink alcohol or use drugs.  Allergies:  Allergies  Allergen Reactions  . Brimonidine Tartrate Shortness Of Breath    Alphagan-Shortness of breath  . Brimonidine Tartrate Palpitations  . Brinzolamide Shortness Of Breath    AZOPT- Shortness of breath  . Latanoprost Shortness Of Breath    XALATAN- Shortness of breath  . Nucynta [Tapentadol] Shortness Of Breath  . Sulfa Antibiotics Palpitations  . Timolol Maleate Shortness Of Breath and Other (See Comments)    TIMOPTIC- Aggravated asthma  . Cardizem  [Diltiazem Hcl] Swelling  . Diltiazem Swelling     leg swelling  . Rofecoxib Swelling     VIOXX- leg swelling  . Vancomycin Hives and Other (See Comments)    Possible "Red Man Syndrome"? > hives/blisters  . Codeine Other (See Comments)    Childhood reaction  . Celecoxib Other (See Comments)    CELLBREX-confusion  . Colchicine Diarrhea    diarrhea  . Tape Rash    Medications: I have reviewed the patient's current medications.  Results for orders placed or performed during the hospital encounter of 09/09/19 (from the past 48 hour(s))  Basic metabolic panel     Status: Abnormal   Collection Time: 09/09/19 11:02 AM  Result Value Ref Range   Sodium 139 135 - 145 mmol/L   Potassium 4.4 3.5 - 5.1 mmol/L   Chloride 107 98 - 111 mmol/L   CO2 22 22 - 32 mmol/L   Glucose, Bld 158 (H)  70 - 99 mg/dL    Comment: Glucose reference range applies only to samples taken after fasting for at least 8 hours.   BUN 15 8 - 23 mg/dL   Creatinine, Ser 1.07 0.61 - 1.24 mg/dL   Calcium 8.9 8.9 - 10.3 mg/dL   GFR calc non Af Amer >60 >60 mL/min  GFR calc Af Amer >60 >60 mL/min   Anion gap 10 5 - 15    Comment: Performed at [facility] [address], [place], [place] [address]  CBC     Status: Abnormal   Collection Time: 09/09/19 11:02 AM  Result Value Ref Range   WBC 9.4 4.0 - 10.5 K/uL   RBC 4.14 (L) 4.22 - 5.81 MIL/uL   Hemoglobin 12.4 (L) 13.0 - 17.0 g/dL   HCT 38.9 (L) 39.0 - 52.0 %   MCV 94.0 80.0 - 100.0 fL   MCH 30.0 26.0 - 34.0 pg   MCHC 31.9 30.0 - 36.0 g/dL   RDW 14.6 11.5 - 15.5 %   Platelets 155 150 - 400 K/uL   nRBC 0.0 0.0 - 0.2 %    Comment: Performed at [facility], [place] [address], [place], [place] [address]  Urinalysis, Routine w reflex microscopic     Status: Abnormal   Collection Time: 09/09/19  5:44 PM  Result Value Ref Range   Color, Urine YELLOW YELLOW   APPearance TURBID (A) CLEAR   Specific Gravity, Urine 1.013 1.005 - 1.030   pH 6.0 5.0 - 8.0   Glucose, UA NEGATIVE NEGATIVE mg/dL   Hgb urine dipstick LARGE (A) NEGATIVE   Bilirubin Urine NEGATIVE NEGATIVE   Ketones, ur NEGATIVE NEGATIVE mg/dL   Protein, ur 100 (A) NEGATIVE mg/dL   Nitrite NEGATIVE NEGATIVE   Leukocytes,Ua MODERATE (A) NEGATIVE   RBC / HPF >50 (H) 0 - 5 RBC/hpf   WBC, UA >50 (H) 0 - 5 WBC/hpf   Bacteria, UA FEW (A) NONE SEEN   Squamous Epithelial / LPF 6-10 0 - 5   WBC Clumps PRESENT    Mucus PRESENT     Comment: Performed at [facility], [place] [address], [place], [place] [address]   *Note: Due to a large number of results and/or encounters for the requested time period, some results have not been displayed. A complete set of results can be found in Results Review.    CT Abdomen Pelvis W Contrast  Result Date: 09/09/2019 CLINICAL DATA:  Dysuria, acute  generalized abdominal pain. EXAM: CT ABDOMEN AND PELVIS WITH CONTRAST TECHNIQUE: Multidetector CT imaging of the abdomen and pelvis was performed using the standard protocol following bolus administration of intravenous contrast. CONTRAST:  170m OMNIPAQUE IOHEXOL 300 MG/ML  SOLN COMPARISON:  None. FINDINGS: Lower chest: No acute abnormality. Hepatobiliary: No focal liver abnormality is seen. Status post cholecystectomy. No biliary dilatation. Pancreas: Unremarkable. No pancreatic ductal dilatation or surrounding inflammatory changes. Spleen: Normal in size without focal abnormality. Adrenals/Urinary Tract: Adrenal glands are unremarkable. Kidneys are normal, without renal calculi, focal lesion, or hydronephrosis. Bladder is unremarkable. Stomach/Bowel: Stomach is within normal limits. Appendix appears normal. No evidence of bowel wall thickening, distention, or inflammatory changes. Sigmoid diverticulosis is noted without inflammation. Vascular/Lymphatic: Aortic atherosclerosis. No enlarged abdominal or pelvic lymph nodes. Reproductive: Prostate is unremarkable. Other: No abdominal wall hernia or abnormality. No abdominopelvic ascites. Musculoskeletal: No acute or significant osseous findings. IMPRESSION: 1. Sigmoid diverticulosis without inflammation. 2. Aortic atherosclerosis. 3. No acute abnormality seen in the abdomen or pelvis. Aortic Atherosclerosis (ICD10-I70.0). Electronically Signed   By: JMarijo ConceptionM.D.   On: 09/09/2019 16:13    Review of Systems  Constitutional: Positive for fatigue.  HENT: Negative.   Eyes: Negative.   Respiratory: Negative.   Cardiovascular: Negative.   Endocrine: Negative.   Genitourinary: Positive for difficulty urinating, discharge,  dysuria, frequency, penile pain and penile swelling.  Allergic/Immunologic: Negative.   Neurological: Negative.   Hematological: Negative.    Blood pressure (!) 123/97, pulse 86, temperature 98 F (36.7 C), temperature source Oral,  resp. rate 12, height 6' (1.829 m), weight (!) 150.6 kg, SpO2 95 %. Physical Exam  Constitutional: He appears well-developed.  Pleasant, morbidliy obese  HENT:  Head: Normocephalic.  Eyes: Pupils are equal, round, and reactive to light.  Cardiovascular: Normal rate.  Respiratory: Effort normal.  GI:  Morbid trncal obesity limits sensitivity of exam.   Genitourinary:    Genitourinary Comments: Pinpoint forskin opening, glans not visualized. Overlying sking erythema with satellite lesions c/w fungal balanitis.    Musculoskeletal:     Cervical back: Normal range of motion.     Comments: recnet LE partial foot amputation site with dressing in place.   Neurological: He is alert.  Skin: Skin is warm.   Using aseptic betadine prep of forskin, forceps used to gently spread to approx 53F. Attempt made to pass 18 coude catheter, but meatus not palpabted / visualized. Next 26F flexible cystoscope used and meatus visualized and 0.38 sensor wire advanced to presumed level or bladder. 53F council catheter then placed to bladder as verrified by 450 cc yellow urine. 10cc sterile water placed in   Assessment/Plan:  1 - Severe Phimosis with Balanitis - likely due to buried penis and cycles of balanitis causing scarring. Given blood thinner use, pt temporized with modest dilattion enough to see glans / place catheter only today. He would benefit from elective penile release / re-do circ in elective setting. Rec 7 days PO diflucan for fungal balanitis.   2 - Urinary Retention / Large Prostate - unclear if retention from mor phimosis v. Prostate level. Either way, catheter placed today. Rec leave in place until able to get operative circ / release. Likey start finasteride + tamsulosin in outpatinet setting.  He will likelyi have gross hematuria with catheter. He will hold blood thinners for now as this is for prevention only (no h/o CVA, no recnet DES per pt).  We will arrange follow up in outpatient  setting.   Alexis Frock 09/09/2019, 9:20 PM

## 2019-09-09 NOTE — ED Notes (Signed)
Pt noted to have bleeding around foley insertion site. PA notified and at bedside

## 2019-09-09 NOTE — ED Notes (Signed)
Attempted to place foley. Pt urethra is swollen and unable to advance catheter. Used 3 different sized without success. During procedure pt did produce a small amount of urine, urine dribbled out. Provider aware.

## 2019-09-09 NOTE — ED Triage Notes (Signed)
Pt reports extremely painful urination starting this morning. Recent toe amputation to left foot.

## 2019-09-09 NOTE — Discharge Instructions (Addendum)
Please take Diflucan twice daily as directed.  You can also use nystatin powder to help with red painful areas over the skin around your penis and scrotum.  Please read the information provided to help take care of your catheter.  Dr. Tresa Moore with urology will arrange follow-up for you in his office.  Return for worsening pain, fevers, urine not passing through the catheter or any new or concerning symptoms.

## 2019-09-11 ENCOUNTER — Inpatient Hospital Stay: Payer: Medicare Other | Admitting: Orthopedic Surgery

## 2019-09-11 ENCOUNTER — Other Ambulatory Visit: Payer: Self-pay

## 2019-09-11 ENCOUNTER — Inpatient Hospital Stay (HOSPITAL_COMMUNITY)
Admission: EM | Admit: 2019-09-11 | Discharge: 2019-09-17 | DRG: 698 | Disposition: A | Discharge status: Home Health | Payer: Medicare Other | Attending: Family Medicine | Admitting: Family Medicine

## 2019-09-11 ENCOUNTER — Emergency Department (HOSPITAL_COMMUNITY): Payer: Medicare Other

## 2019-09-11 DIAGNOSIS — R310 Gross hematuria: Secondary | ICD-10-CM | POA: Diagnosis present

## 2019-09-11 DIAGNOSIS — N481 Balanitis: Secondary | ICD-10-CM | POA: Diagnosis present

## 2019-09-11 DIAGNOSIS — Z95810 Presence of automatic (implantable) cardiac defibrillator: Secondary | ICD-10-CM | POA: Diagnosis present

## 2019-09-11 DIAGNOSIS — D6959 Other secondary thrombocytopenia: Secondary | ICD-10-CM | POA: Diagnosis present

## 2019-09-11 DIAGNOSIS — E669 Obesity, unspecified: Secondary | ICD-10-CM | POA: Diagnosis present

## 2019-09-11 DIAGNOSIS — Z885 Allergy status to narcotic agent status: Secondary | ICD-10-CM

## 2019-09-11 DIAGNOSIS — N3289 Other specified disorders of bladder: Secondary | ICD-10-CM | POA: Diagnosis not present

## 2019-09-11 DIAGNOSIS — E785 Hyperlipidemia, unspecified: Secondary | ICD-10-CM | POA: Diagnosis present

## 2019-09-11 DIAGNOSIS — N471 Phimosis: Secondary | ICD-10-CM | POA: Diagnosis present

## 2019-09-11 DIAGNOSIS — Z955 Presence of coronary angioplasty implant and graft: Secondary | ICD-10-CM

## 2019-09-11 DIAGNOSIS — E1142 Type 2 diabetes mellitus with diabetic polyneuropathy: Secondary | ICD-10-CM | POA: Diagnosis present

## 2019-09-11 DIAGNOSIS — R7881 Bacteremia: Secondary | ICD-10-CM | POA: Diagnosis present

## 2019-09-11 DIAGNOSIS — I110 Hypertensive heart disease with heart failure: Secondary | ICD-10-CM | POA: Diagnosis present

## 2019-09-11 DIAGNOSIS — Z6841 Body Mass Index (BMI) 40.0 and over, adult: Secondary | ICD-10-CM

## 2019-09-11 DIAGNOSIS — I2510 Atherosclerotic heart disease of native coronary artery without angina pectoris: Secondary | ICD-10-CM | POA: Diagnosis present

## 2019-09-11 DIAGNOSIS — K760 Fatty (change of) liver, not elsewhere classified: Secondary | ICD-10-CM | POA: Diagnosis present

## 2019-09-11 DIAGNOSIS — E662 Morbid (severe) obesity with alveolar hypoventilation: Secondary | ICD-10-CM | POA: Diagnosis present

## 2019-09-11 DIAGNOSIS — T83518A Infection and inflammatory reaction due to other urinary catheter, initial encounter: Principal | ICD-10-CM | POA: Diagnosis present

## 2019-09-11 DIAGNOSIS — I495 Sick sinus syndrome: Secondary | ICD-10-CM | POA: Diagnosis present

## 2019-09-11 DIAGNOSIS — Z9049 Acquired absence of other specified parts of digestive tract: Secondary | ICD-10-CM

## 2019-09-11 DIAGNOSIS — I878 Other specified disorders of veins: Secondary | ICD-10-CM | POA: Diagnosis present

## 2019-09-11 DIAGNOSIS — Z9109 Other allergy status, other than to drugs and biological substances: Secondary | ICD-10-CM

## 2019-09-11 DIAGNOSIS — Z89432 Acquired absence of left foot: Secondary | ICD-10-CM | POA: Diagnosis present

## 2019-09-11 DIAGNOSIS — R338 Other retention of urine: Secondary | ICD-10-CM | POA: Diagnosis present

## 2019-09-11 DIAGNOSIS — Z4502 Encounter for adjustment and management of automatic implantable cardiac defibrillator: Secondary | ICD-10-CM

## 2019-09-11 DIAGNOSIS — D649 Anemia, unspecified: Secondary | ICD-10-CM | POA: Diagnosis present

## 2019-09-11 DIAGNOSIS — Z882 Allergy status to sulfonamides status: Secondary | ICD-10-CM

## 2019-09-11 DIAGNOSIS — L03119 Cellulitis of unspecified part of limb: Secondary | ICD-10-CM | POA: Diagnosis present

## 2019-09-11 DIAGNOSIS — Y846 Urinary catheterization as the cause of abnormal reaction of the patient, or of later complication, without mention of misadventure at the time of the procedure: Secondary | ICD-10-CM | POA: Diagnosis present

## 2019-09-11 DIAGNOSIS — H9190 Unspecified hearing loss, unspecified ear: Secondary | ICD-10-CM | POA: Diagnosis present

## 2019-09-11 DIAGNOSIS — A419 Sepsis, unspecified organism: Secondary | ICD-10-CM | POA: Diagnosis present

## 2019-09-11 DIAGNOSIS — Z952 Presence of prosthetic heart valve: Secondary | ICD-10-CM

## 2019-09-11 DIAGNOSIS — Z20822 Contact with and (suspected) exposure to covid-19: Secondary | ICD-10-CM | POA: Diagnosis present

## 2019-09-11 DIAGNOSIS — E1140 Type 2 diabetes mellitus with diabetic neuropathy, unspecified: Secondary | ICD-10-CM | POA: Diagnosis present

## 2019-09-11 DIAGNOSIS — Z881 Allergy status to other antibiotic agents status: Secondary | ICD-10-CM

## 2019-09-11 DIAGNOSIS — G4733 Obstructive sleep apnea (adult) (pediatric): Secondary | ICD-10-CM | POA: Diagnosis present

## 2019-09-11 DIAGNOSIS — E7800 Pure hypercholesterolemia, unspecified: Secondary | ICD-10-CM | POA: Diagnosis present

## 2019-09-11 DIAGNOSIS — I350 Nonrheumatic aortic (valve) stenosis: Secondary | ICD-10-CM

## 2019-09-11 DIAGNOSIS — I872 Venous insufficiency (chronic) (peripheral): Secondary | ICD-10-CM | POA: Diagnosis present

## 2019-09-11 DIAGNOSIS — K219 Gastro-esophageal reflux disease without esophagitis: Secondary | ICD-10-CM | POA: Diagnosis present

## 2019-09-11 DIAGNOSIS — J449 Chronic obstructive pulmonary disease, unspecified: Secondary | ICD-10-CM | POA: Diagnosis present

## 2019-09-11 DIAGNOSIS — Z7982 Long term (current) use of aspirin: Secondary | ICD-10-CM

## 2019-09-11 DIAGNOSIS — G894 Chronic pain syndrome: Secondary | ICD-10-CM | POA: Diagnosis present

## 2019-09-11 DIAGNOSIS — Q5564 Hidden penis: Secondary | ICD-10-CM

## 2019-09-11 DIAGNOSIS — I5042 Chronic combined systolic (congestive) and diastolic (congestive) heart failure: Secondary | ICD-10-CM | POA: Diagnosis present

## 2019-09-11 DIAGNOSIS — Z91048 Other nonmedicinal substance allergy status: Secondary | ICD-10-CM

## 2019-09-11 DIAGNOSIS — I480 Paroxysmal atrial fibrillation: Secondary | ICD-10-CM | POA: Diagnosis present

## 2019-09-11 DIAGNOSIS — I10 Essential (primary) hypertension: Secondary | ICD-10-CM | POA: Diagnosis present

## 2019-09-11 DIAGNOSIS — L8990 Pressure ulcer of unspecified site, unspecified stage: Secondary | ICD-10-CM | POA: Diagnosis present

## 2019-09-11 DIAGNOSIS — A4181 Sepsis due to Enterococcus: Secondary | ICD-10-CM | POA: Diagnosis present

## 2019-09-11 DIAGNOSIS — Z950 Presence of cardiac pacemaker: Secondary | ICD-10-CM

## 2019-09-11 DIAGNOSIS — Z8249 Family history of ischemic heart disease and other diseases of the circulatory system: Secondary | ICD-10-CM

## 2019-09-11 DIAGNOSIS — E1136 Type 2 diabetes mellitus with diabetic cataract: Secondary | ICD-10-CM | POA: Diagnosis present

## 2019-09-11 DIAGNOSIS — B952 Enterococcus as the cause of diseases classified elsewhere: Secondary | ICD-10-CM | POA: Diagnosis present

## 2019-09-11 DIAGNOSIS — K5730 Diverticulosis of large intestine without perforation or abscess without bleeding: Secondary | ICD-10-CM | POA: Diagnosis present

## 2019-09-11 DIAGNOSIS — Z7901 Long term (current) use of anticoagulants: Secondary | ICD-10-CM

## 2019-09-11 DIAGNOSIS — R339 Retention of urine, unspecified: Secondary | ICD-10-CM | POA: Diagnosis present

## 2019-09-11 DIAGNOSIS — N401 Enlarged prostate with lower urinary tract symptoms: Secondary | ICD-10-CM | POA: Diagnosis present

## 2019-09-11 DIAGNOSIS — Z79899 Other long term (current) drug therapy: Secondary | ICD-10-CM

## 2019-09-11 DIAGNOSIS — M869 Osteomyelitis, unspecified: Secondary | ICD-10-CM | POA: Diagnosis present

## 2019-09-11 DIAGNOSIS — E876 Hypokalemia: Secondary | ICD-10-CM | POA: Diagnosis present

## 2019-09-11 DIAGNOSIS — B999 Unspecified infectious disease: Secondary | ICD-10-CM

## 2019-09-11 DIAGNOSIS — N390 Urinary tract infection, site not specified: Secondary | ICD-10-CM

## 2019-09-11 DIAGNOSIS — Z8711 Personal history of peptic ulcer disease: Secondary | ICD-10-CM

## 2019-09-11 DIAGNOSIS — Z888 Allergy status to other drugs, medicaments and biological substances status: Secondary | ICD-10-CM

## 2019-09-11 MED ORDER — PIPERACILLIN-TAZOBACTAM 3.375 G IVPB 30 MIN
3.3750 g | Freq: Once | INTRAVENOUS | Status: AC
  Administered 2019-09-12: 3.375 g via INTRAVENOUS
  Filled 2019-09-11: qty 50

## 2019-09-11 MED ORDER — SODIUM CHLORIDE 0.9 % IV SOLN
750.0000 mg | Freq: Once | INTRAVENOUS | Status: AC
  Administered 2019-09-12: 750 mg via INTRAVENOUS
  Filled 2019-09-11: qty 15

## 2019-09-11 MED ORDER — PIPERACILLIN-TAZOBACTAM 3.375 G IVPB
3.3750 g | Freq: Three times a day (TID) | INTRAVENOUS | Status: DC
  Administered 2019-09-12 – 2019-09-13 (×4): 3.375 g via INTRAVENOUS
  Filled 2019-09-11 (×4): qty 50

## 2019-09-11 NOTE — ED Triage Notes (Addendum)
PER EMS: Patient is coming from home with c/o medication error. Patient has been taking fluconazole instead of the antibiotic he was meant to be taking for the past two days. Primary care doctor told the patient to be seen here. Pt has a catheter bag with blood in it which is normal according to the wife. Neuropathy in feet causing pain.  EMS VITALS: BP 130/100 HR 87 SPO2 99% RA TEMP 98.5

## 2019-09-11 NOTE — Progress Notes (Signed)
A consult was received from an ED physician for Daptomycin/zosyn per pharmacy dosing.  The patient's profile has been reviewed for ht/wt/allergies/indication/available labs.   A one time order has been placed for Daptomycin 714m iv x1, Zosyn 3.375g IV Q8H infused over 4hrs.  Further antibiotics/pharmacy consults should be ordered by admitting physician if indicated.                       Thank you, GNani SkillernCrowford 09/11/2019  11:58 PM

## 2019-09-12 ENCOUNTER — Encounter (HOSPITAL_COMMUNITY): Payer: Self-pay | Admitting: Internal Medicine

## 2019-09-12 ENCOUNTER — Inpatient Hospital Stay (HOSPITAL_COMMUNITY): Payer: Medicare Other

## 2019-09-12 ENCOUNTER — Ambulatory Visit: Payer: Medicare Other | Admitting: Family

## 2019-09-12 DIAGNOSIS — M869 Osteomyelitis, unspecified: Secondary | ICD-10-CM | POA: Diagnosis present

## 2019-09-12 DIAGNOSIS — N390 Urinary tract infection, site not specified: Secondary | ICD-10-CM

## 2019-09-12 DIAGNOSIS — N481 Balanitis: Secondary | ICD-10-CM | POA: Diagnosis present

## 2019-09-12 DIAGNOSIS — Z881 Allergy status to other antibiotic agents status: Secondary | ICD-10-CM | POA: Diagnosis not present

## 2019-09-12 DIAGNOSIS — I480 Paroxysmal atrial fibrillation: Secondary | ICD-10-CM | POA: Diagnosis present

## 2019-09-12 DIAGNOSIS — I340 Nonrheumatic mitral (valve) insufficiency: Secondary | ICD-10-CM | POA: Diagnosis not present

## 2019-09-12 DIAGNOSIS — Z89432 Acquired absence of left foot: Secondary | ICD-10-CM | POA: Diagnosis not present

## 2019-09-12 DIAGNOSIS — E1136 Type 2 diabetes mellitus with diabetic cataract: Secondary | ICD-10-CM | POA: Diagnosis present

## 2019-09-12 DIAGNOSIS — R7881 Bacteremia: Secondary | ICD-10-CM | POA: Diagnosis not present

## 2019-09-12 DIAGNOSIS — Z952 Presence of prosthetic heart valve: Secondary | ICD-10-CM | POA: Diagnosis not present

## 2019-09-12 DIAGNOSIS — L03119 Cellulitis of unspecified part of limb: Secondary | ICD-10-CM | POA: Diagnosis present

## 2019-09-12 DIAGNOSIS — B952 Enterococcus as the cause of diseases classified elsewhere: Secondary | ICD-10-CM

## 2019-09-12 DIAGNOSIS — R6510 Systemic inflammatory response syndrome (SIRS) of non-infectious origin without acute organ dysfunction: Secondary | ICD-10-CM | POA: Insufficient documentation

## 2019-09-12 DIAGNOSIS — Z20822 Contact with and (suspected) exposure to covid-19: Secondary | ICD-10-CM | POA: Diagnosis present

## 2019-09-12 DIAGNOSIS — Z955 Presence of coronary angioplasty implant and graft: Secondary | ICD-10-CM | POA: Diagnosis not present

## 2019-09-12 DIAGNOSIS — R310 Gross hematuria: Secondary | ICD-10-CM | POA: Diagnosis not present

## 2019-09-12 DIAGNOSIS — E7800 Pure hypercholesterolemia, unspecified: Secondary | ICD-10-CM | POA: Diagnosis present

## 2019-09-12 DIAGNOSIS — T83518A Infection and inflammatory reaction due to other urinary catheter, initial encounter: Secondary | ICD-10-CM | POA: Diagnosis present

## 2019-09-12 DIAGNOSIS — D6959 Other secondary thrombocytopenia: Secondary | ICD-10-CM | POA: Diagnosis present

## 2019-09-12 DIAGNOSIS — A4181 Sepsis due to Enterococcus: Secondary | ICD-10-CM | POA: Diagnosis present

## 2019-09-12 DIAGNOSIS — N471 Phimosis: Secondary | ICD-10-CM | POA: Diagnosis present

## 2019-09-12 DIAGNOSIS — Z9049 Acquired absence of other specified parts of digestive tract: Secondary | ICD-10-CM | POA: Diagnosis not present

## 2019-09-12 DIAGNOSIS — D649 Anemia, unspecified: Secondary | ICD-10-CM | POA: Diagnosis present

## 2019-09-12 DIAGNOSIS — Z950 Presence of cardiac pacemaker: Secondary | ICD-10-CM | POA: Diagnosis not present

## 2019-09-12 DIAGNOSIS — A419 Sepsis, unspecified organism: Secondary | ICD-10-CM | POA: Diagnosis present

## 2019-09-12 DIAGNOSIS — E785 Hyperlipidemia, unspecified: Secondary | ICD-10-CM | POA: Diagnosis present

## 2019-09-12 DIAGNOSIS — R319 Hematuria, unspecified: Secondary | ICD-10-CM | POA: Diagnosis not present

## 2019-09-12 DIAGNOSIS — I2510 Atherosclerotic heart disease of native coronary artery without angina pectoris: Secondary | ICD-10-CM | POA: Diagnosis present

## 2019-09-12 DIAGNOSIS — H9190 Unspecified hearing loss, unspecified ear: Secondary | ICD-10-CM | POA: Diagnosis present

## 2019-09-12 DIAGNOSIS — E662 Morbid (severe) obesity with alveolar hypoventilation: Secondary | ICD-10-CM | POA: Diagnosis present

## 2019-09-12 DIAGNOSIS — Z6841 Body Mass Index (BMI) 40.0 and over, adult: Secondary | ICD-10-CM | POA: Diagnosis not present

## 2019-09-12 DIAGNOSIS — E1142 Type 2 diabetes mellitus with diabetic polyneuropathy: Secondary | ICD-10-CM | POA: Diagnosis present

## 2019-09-12 DIAGNOSIS — I872 Venous insufficiency (chronic) (peripheral): Secondary | ICD-10-CM | POA: Diagnosis not present

## 2019-09-12 DIAGNOSIS — Y846 Urinary catheterization as the cause of abnormal reaction of the patient, or of later complication, without mention of misadventure at the time of the procedure: Secondary | ICD-10-CM | POA: Diagnosis present

## 2019-09-12 DIAGNOSIS — I5042 Chronic combined systolic (congestive) and diastolic (congestive) heart failure: Secondary | ICD-10-CM | POA: Diagnosis present

## 2019-09-12 DIAGNOSIS — Z95810 Presence of automatic (implantable) cardiac defibrillator: Secondary | ICD-10-CM | POA: Diagnosis not present

## 2019-09-12 DIAGNOSIS — I10 Essential (primary) hypertension: Secondary | ICD-10-CM | POA: Diagnosis not present

## 2019-09-12 LAB — BLOOD CULTURE ID PANEL (REFLEXED)

## 2019-09-12 LAB — CBC WITH DIFFERENTIAL/PLATELET
Abs Immature Granulocytes: 0.05 10*3/uL (ref 0.00–0.07)
Abs Immature Granulocytes: 0.06 10*3/uL (ref 0.00–0.07)
Basophils Absolute: 0 10*3/uL (ref 0.0–0.1)
Basophils Absolute: 0 10*3/uL (ref 0.0–0.1)
Basophils Relative: 0 %
Basophils Relative: 0 %
Eosinophils Absolute: 0 10*3/uL (ref 0.0–0.5)
Eosinophils Absolute: 0 10*3/uL (ref 0.0–0.5)
Eosinophils Relative: 0 %
Eosinophils Relative: 0 %
HCT: 31.3 % — ABNORMAL LOW (ref 39.0–52.0)
HCT: 33.9 % — ABNORMAL LOW (ref 39.0–52.0)
Hemoglobin: 10.1 g/dL — ABNORMAL LOW (ref 13.0–17.0)
Hemoglobin: 10.8 g/dL — ABNORMAL LOW (ref 13.0–17.0)
Immature Granulocytes: 0 %
Immature Granulocytes: 0 %
Lymphocytes Relative: 5 %
Lymphocytes Relative: 6 %
Lymphs Abs: 0.7 10*3/uL (ref 0.7–4.0)
Lymphs Abs: 0.7 10*3/uL (ref 0.7–4.0)
MCH: 30.5 pg (ref 26.0–34.0)
MCH: 30.7 pg (ref 26.0–34.0)
MCHC: 31.9 g/dL (ref 30.0–36.0)
MCHC: 32.3 g/dL (ref 30.0–36.0)
MCV: 95.1 fL (ref 80.0–100.0)
MCV: 95.8 fL (ref 80.0–100.0)
Monocytes Absolute: 1.3 10*3/uL — ABNORMAL HIGH (ref 0.1–1.0)
Monocytes Absolute: 1.5 10*3/uL — ABNORMAL HIGH (ref 0.1–1.0)
Monocytes Relative: 11 %
Monocytes Relative: 11 %
Neutro Abs: 10 10*3/uL — ABNORMAL HIGH (ref 1.7–7.7)
Neutro Abs: 11.6 10*3/uL — ABNORMAL HIGH (ref 1.7–7.7)
Neutrophils Relative %: 83 %
Neutrophils Relative %: 84 %
Platelets: 117 10*3/uL — ABNORMAL LOW (ref 150–400)
Platelets: 127 10*3/uL — ABNORMAL LOW (ref 150–400)
RBC: 3.29 MIL/uL — ABNORMAL LOW (ref 4.22–5.81)
RBC: 3.54 MIL/uL — ABNORMAL LOW (ref 4.22–5.81)
RDW: 14.6 % (ref 11.5–15.5)
RDW: 14.7 % (ref 11.5–15.5)
WBC: 12 10*3/uL — ABNORMAL HIGH (ref 4.0–10.5)
WBC: 13.9 10*3/uL — ABNORMAL HIGH (ref 4.0–10.5)
nRBC: 0 % (ref 0.0–0.2)
nRBC: 0 % (ref 0.0–0.2)

## 2019-09-12 LAB — URINALYSIS, ROUTINE W REFLEX MICROSCOPIC
RBC / HPF: >50 RBC/hpf — ABNORMAL HIGH (ref 0–5)
WBC, UA: >50 WBC/hpf — ABNORMAL HIGH (ref 0–5)

## 2019-09-12 LAB — COMPREHENSIVE METABOLIC PANEL
ALT: 101 U/L — ABNORMAL HIGH (ref 0–44)
ALT: 90 U/L — ABNORMAL HIGH (ref 0–44)
AST: 110 U/L — ABNORMAL HIGH (ref 15–41)
AST: 127 U/L — ABNORMAL HIGH (ref 15–41)
Albumin: 2.8 g/dL — ABNORMAL LOW (ref 3.5–5.0)
Albumin: 3 g/dL — ABNORMAL LOW (ref 3.5–5.0)
Alkaline Phosphatase: 120 U/L (ref 38–126)
Alkaline Phosphatase: 133 U/L — ABNORMAL HIGH (ref 38–126)
Anion gap: 11 (ref 5–15)
Anion gap: 9 (ref 5–15)
BUN: 19 mg/dL (ref 8–23)
BUN: 19 mg/dL (ref 8–23)
CO2: 24 mmol/L (ref 22–32)
CO2: 27 mmol/L (ref 22–32)
Calcium: 8.2 mg/dL — ABNORMAL LOW (ref 8.9–10.3)
Calcium: 8.5 mg/dL — ABNORMAL LOW (ref 8.9–10.3)
Chloride: 102 mmol/L (ref 98–111)
Chloride: 103 mmol/L (ref 98–111)
Creatinine, Ser: 1.02 mg/dL (ref 0.61–1.24)
Creatinine, Ser: 1.08 mg/dL (ref 0.61–1.24)
GFR calc Af Amer: >60 mL/min (ref >60)
GFR calc Af Amer: >60 mL/min (ref >60)
GFR calc non Af Amer: >60 mL/min (ref >60)
GFR calc non Af Amer: >60 mL/min (ref >60)
Glucose, Bld: 141 mg/dL — ABNORMAL HIGH (ref 70–99)
Glucose, Bld: 146 mg/dL — ABNORMAL HIGH (ref 70–99)
Potassium: 3.7 mmol/L (ref 3.5–5.1)
Potassium: 4.2 mmol/L (ref 3.5–5.1)
Sodium: 138 mmol/L (ref 135–145)
Sodium: 138 mmol/L (ref 135–145)
Total Bilirubin: 1 mg/dL (ref 0.3–1.2)
Total Bilirubin: 1 mg/dL (ref 0.3–1.2)
Total Protein: 5.8 g/dL — ABNORMAL LOW (ref 6.5–8.1)
Total Protein: 6.3 g/dL — ABNORMAL LOW (ref 6.5–8.1)

## 2019-09-12 LAB — GLUCOSE, CAPILLARY: Glucose-Capillary: 106 mg/dL — ABNORMAL HIGH (ref 70–99)

## 2019-09-12 LAB — MRSA PCR SCREENING: MRSA by PCR: NEGATIVE

## 2019-09-12 LAB — LACTIC ACID, PLASMA
Lactic Acid, Venous: 0.7 mmol/L (ref 0.5–1.9)
Lactic Acid, Venous: 0.9 mmol/L (ref 0.5–1.9)
Lactic Acid, Venous: 1.3 mmol/L (ref 0.5–1.9)

## 2019-09-12 LAB — CBG MONITORING, ED
Glucose-Capillary: 102 mg/dL — ABNORMAL HIGH (ref 70–99)
Glucose-Capillary: 122 mg/dL — ABNORMAL HIGH (ref 70–99)
Glucose-Capillary: 113 mg/dL — ABNORMAL HIGH (ref 70–99)

## 2019-09-12 LAB — PROCALCITONIN: Procalcitonin: 0.21 ng/mL

## 2019-09-12 LAB — SARS CORONAVIRUS 2 (TAT 6-24 HRS): SARS Coronavirus 2: NEGATIVE

## 2019-09-12 LAB — URINE CULTURE: Culture: >=100000 — AB

## 2019-09-12 LAB — PROTIME-INR
INR: 1.2 (ref 0.8–1.2)
Prothrombin Time: 15.3 seconds — ABNORMAL HIGH (ref 11.4–15.2)

## 2019-09-12 LAB — APTT: aPTT: 31 seconds (ref 24–36)

## 2019-09-12 MED ORDER — ONDANSETRON HCL 4 MG/2ML IJ SOLN: 4.0000 mg | Freq: Four times a day (QID) | INTRAMUSCULAR | Status: DC | PRN

## 2019-09-12 MED ORDER — INSULIN ASPART 100 UNIT/ML ~~LOC~~ SOLN
0.0000 [IU] | Freq: Three times a day (TID) | SUBCUTANEOUS | Status: DC
  Administered 2019-09-12 – 2019-09-14 (×4): 1 [IU] via SUBCUTANEOUS
  Administered 2019-09-15: 2 [IU] via SUBCUTANEOUS
  Administered 2019-09-16 – 2019-09-17 (×2): 1 [IU] via SUBCUTANEOUS
  Filled 2019-09-12: qty 0.09

## 2019-09-12 MED ORDER — ACETAMINOPHEN 325 MG PO TABS
650.0000 mg | ORAL_TABLET | Freq: Once | ORAL | Status: AC
  Administered 2019-09-12: 650 mg via ORAL
  Filled 2019-09-12: qty 2

## 2019-09-12 MED ORDER — CYCLOBENZAPRINE HCL 5 MG PO TABS
5.0000 mg | ORAL_TABLET | Freq: Every day | ORAL | Status: DC
  Administered 2019-09-12 – 2019-09-16 (×5): 5 mg via ORAL
  Filled 2019-09-12 (×5): qty 1

## 2019-09-12 MED ORDER — MORPHINE SULFATE (PF) 4 MG/ML IV SOLN
4.0000 mg | Freq: Once | INTRAVENOUS | Status: AC
  Administered 2019-09-12: 4 mg via INTRAVENOUS
  Filled 2019-09-12: qty 1

## 2019-09-12 MED ORDER — GABAPENTIN 400 MG PO CAPS
800.0000 mg | ORAL_CAPSULE | Freq: Four times a day (QID) | ORAL | Status: DC
  Administered 2019-09-12 – 2019-09-17 (×22): 800 mg via ORAL
  Filled 2019-09-12 (×22): qty 2

## 2019-09-12 MED ORDER — ROSUVASTATIN CALCIUM 20 MG PO TABS
40.0000 mg | ORAL_TABLET | Freq: Every day | ORAL | Status: DC
  Administered 2019-09-12 – 2019-09-17 (×6): 40 mg via ORAL
  Filled 2019-09-12 (×6): qty 2

## 2019-09-12 MED ORDER — METOPROLOL SUCCINATE ER 50 MG PO TB24: 50.0000 mg | ORAL_TABLET | Freq: Every day | ORAL | Status: DC

## 2019-09-12 MED ORDER — FINASTERIDE 5 MG PO TABS
5.0000 mg | ORAL_TABLET | Freq: Every day | ORAL | Status: DC
  Administered 2019-09-12 – 2019-09-17 (×6): 5 mg via ORAL
  Filled 2019-09-12 (×6): qty 1

## 2019-09-12 MED ORDER — PANTOPRAZOLE SODIUM 40 MG PO TBEC
40.0000 mg | DELAYED_RELEASE_TABLET | Freq: Every day | ORAL | Status: DC
  Administered 2019-09-12 – 2019-09-17 (×6): 40 mg via ORAL
  Filled 2019-09-12 (×6): qty 1

## 2019-09-12 MED ORDER — OXYBUTYNIN CHLORIDE 5 MG PO TABS
5.0000 mg | ORAL_TABLET | Freq: Three times a day (TID) | ORAL | Status: DC | PRN
  Administered 2019-09-12: 5 mg via ORAL
  Filled 2019-09-12: qty 1

## 2019-09-12 MED ORDER — ACETAMINOPHEN 325 MG PO TABS
650.0000 mg | ORAL_TABLET | Freq: Four times a day (QID) | ORAL | Status: DC | PRN
  Administered 2019-09-12 – 2019-09-13 (×2): 650 mg via ORAL
  Filled 2019-09-12 (×2): qty 2

## 2019-09-12 MED ORDER — METOPROLOL SUCCINATE ER 25 MG PO TB24
75.0000 mg | ORAL_TABLET | Freq: Every day | ORAL | Status: DC
  Filled 2019-09-12: qty 3

## 2019-09-12 MED ORDER — SODIUM CHLORIDE 0.9 % IV BOLUS
1000.0000 mL | Freq: Once | INTRAVENOUS | Status: AC
  Administered 2019-09-12: 1000 mL via INTRAVENOUS

## 2019-09-12 MED ORDER — ONDANSETRON HCL 4 MG PO TABS: 4.0000 mg | ORAL_TABLET | Freq: Four times a day (QID) | ORAL | Status: DC | PRN

## 2019-09-12 MED ORDER — SODIUM CHLORIDE 0.9 % IV BOLUS
500.0000 mL | Freq: Once | INTRAVENOUS | Status: AC
  Administered 2019-09-12: 500 mL via INTRAVENOUS

## 2019-09-12 MED ORDER — NIACIN ER (ANTIHYPERLIPIDEMIC) 500 MG PO TBCR
1000.0000 mg | EXTENDED_RELEASE_TABLET | Freq: Two times a day (BID) | ORAL | Status: DC
  Administered 2019-09-12 – 2019-09-17 (×10): 1000 mg via ORAL
  Filled 2019-09-12 (×15): qty 2

## 2019-09-12 MED ORDER — ORAL CARE MOUTH RINSE: 15.0000 mL | Freq: Two times a day (BID) | OROMUCOSAL | Status: DC

## 2019-09-12 MED ORDER — ALLOPURINOL 300 MG PO TABS
300.0000 mg | ORAL_TABLET | Freq: Every day | ORAL | Status: DC
  Administered 2019-09-12 – 2019-09-17 (×6): 300 mg via ORAL
  Filled 2019-09-12 (×5): qty 1
  Filled 2019-09-12: qty 3

## 2019-09-12 MED ORDER — LINEZOLID 600 MG/300ML IV SOLN
600.0000 mg | Freq: Two times a day (BID) | INTRAVENOUS | Status: DC
  Administered 2019-09-13 (×2): 600 mg via INTRAVENOUS
  Filled 2019-09-12 (×2): qty 300

## 2019-09-12 MED ORDER — ALBUTEROL SULFATE HFA 108 (90 BASE) MCG/ACT IN AERS: 2.0000 | INHALATION_SPRAY | Freq: Four times a day (QID) | RESPIRATORY_TRACT | Status: DC | PRN

## 2019-09-12 MED ORDER — NITROGLYCERIN 0.4 MG SL SUBL: 0.4000 mg | SUBLINGUAL_TABLET | SUBLINGUAL | Status: DC | PRN

## 2019-09-12 MED ORDER — ALBUTEROL SULFATE (2.5 MG/3ML) 0.083% IN NEBU: 2.5000 mg | INHALATION_SOLUTION | Freq: Four times a day (QID) | RESPIRATORY_TRACT | Status: DC | PRN

## 2019-09-12 MED ORDER — CHLORHEXIDINE GLUCONATE CLOTH 2 % EX PADS
6.0000 | MEDICATED_PAD | Freq: Every day | CUTANEOUS | Status: DC
  Administered 2019-09-12 – 2019-09-16 (×5): 6 via TOPICAL

## 2019-09-12 MED ORDER — ACETAMINOPHEN 650 MG RE SUPP: 650.0000 mg | Freq: Four times a day (QID) | RECTAL | Status: DC | PRN

## 2019-09-12 MED ORDER — EZETIMIBE 10 MG PO TABS
10.0000 mg | ORAL_TABLET | Freq: Every day | ORAL | Status: DC
  Administered 2019-09-12 – 2019-09-17 (×6): 10 mg via ORAL
  Filled 2019-09-12 (×6): qty 1

## 2019-09-12 NOTE — ED Notes (Addendum)
"  Can we get an order to bolus pt base on his 30cc/kg>>... for sepsis protocol". Message from sepsis tracking relayed to Hal Hope, MD.

## 2019-09-12 NOTE — ED Notes (Signed)
Pt yelling out from pain. MD contacted and pain medication requested.

## 2019-09-12 NOTE — Progress Notes (Signed)
Renal bladder US reviewed. Tip of catheter was not in the bladder.  A 0.38 sensor wire was placed through the 18Fr council tip catheter was removed.  A 20Fr council tip foley was then placed over the wire and into the bladder without difficulty.  >500cc dark brown urine drained into drainage bag.  No clots seen.  -continue foley to gravity drainage -hold anticoagulation -urology to reasses in AM -may need hand irrigation by RN prn

## 2019-09-12 NOTE — Progress Notes (Signed)
PHARMACY - PHYSICIAN COMMUNICATION CRITICAL VALUE ALERT - BLOOD CULTURE IDENTIFICATION (BCID)  Tim [name] is an 79 y.o. male who presented to [facility] on 09/11/2019 with a chief complaint of fever  Assessment:  presents with urinary tract infection with venous stasis swelling of both lower extremities status post left transmetatarsal amputation 2-1/2 weeks ago.     Name of physician (or Provider) Contacted: Dr. Algis Liming  Current antibiotics: linezolid and Zosyn   Changes to prescribed antibiotics recommended:  Patient is on recommended antibiotics - No changes needed  No results found. However, due to the size of the patient record, not all encounters were searched. Please check Results Review for a complete set of results.    Royetta Asal, PharmD, BCPS 09/12/2019 5:41 PM

## 2019-09-12 NOTE — H&P (Signed)
History and Physical    Tim [name] YDX:[id number] DOB: 02-12-1941 DOA: 09/11/2019  PCP: Tammi Sou, MD  Patient coming from: Home.  Chief Complaint: Fever and medication issues.  HPI: Tim [name] is a 79 y.o. male with history of CAD status post stenting, cardiomyopathy, status post TAVR, diabetes mellitus had a left transmetatarsal amputation by Dr. Sharol Given on August 29, 2019 for osteomyelitis and also had come to the ER on September 09, 2019 with concerns of decreased urination and pain for which patient was seen by Dr. Tresa Moore urologist and was found to have balanitis and had urinary catheter placed and plan was to have further procedure done next week for which patient's apixaban is on hold was prescribed antibiotic and patient was taking fluconazole for last 2 days.  Per patient's wife patient started having fever and chills yesterday with temperature of 104 F and patient was confused.  Given the symptoms patient presents to the ER.  Denies any nausea vomiting diarrhea chest pain or shortness of breath.  ED Course: In the ER patient blood pressures in the low normal and lactic acid was normal.  Procalcitonin was 0.21 WBC 13.9 hemoglobin 10.8.  X-ray of the left foot does not show any osteomyelitis chest x-ray unremarkable.  UA is consistent with UTI and recent urine culture showed Enterococcus.  Patient was started on daptomycin since patient was allergic to vancomycin and also on Zosyn after blood cultures obtained.  On my exam patient is alert awake oriented to time place and person.  Stated blood pressure usually runs low.  Review of Systems: As per HPI, rest all negative.   Past Medical History:  Diagnosis Date  . AICD (automatic cardioverter/defibrillator) present   . ASTHMA   . Asthma    as a child  . BPH (benign prostatic hypertrophy)   . CAD (coronary artery disease)    Nonobstructive by 12/2012 cath; then 03/2016 he required BMS to RCA ([facility]).  In-stent restenosis on  cath 11/01/16, baloon angioplasty successful--pt to take plavix (no ASA), + eliquis (for PAF).  [name] CATARACT, HX OF   . Chronic combined systolic and diastolic CHF (congestive heart failure) ([place]) 05/2016   Ischemic CM.  04/2018 EF 40-45%, grd I DD.  [name] Chronic pain syndrome    Lumbar DDD; chronic neuropathic pain (DM); has spinal stimulator and sees pain mgmt MD  . COPD   . Diabetes mellitus type 2 with complications (HCC)    HMC9O jumped from 5.7% to 6.1% 03/2015---started metformin at that time.  DM 2 dx by fasting gluc criteria 2018.  Has chronic neuropath pain  . Elevated transaminase level 02/2016   Suspect due to fatty liver (documented on u/s 2007). Plan repeat labs 03/2016.  [name] Essential hypertension, benign   . Fatty liver 2007   2007 u/s showed fatty liver with hepatosplenomegaly.  2019 repeat u/s->fatty liver but no cirrhosis or hepatosplenomegaly.  [name] GERD (gastroesophageal reflux disease)   . Glaucoma   . GOUT   . Hallux valgus (acquired)   . HH (hiatus hernia)   . HYPERCHOLESTEROLEMIA-PURE   . Hypogonadism male   . Lumbosacral neuritis   . Lumbosacral spondylosis    Lumbar spinal stenosis with neurogenic claudication--contributes to his chronic pain syndrome  . Normal memory function 08/2014   Neuropsychological testing ([facility]): no cognitive impairment or sign of neurodegenerative disorder.  Likely has adjustment d/o with mixed anxiety/depressed features and may benefit from low dose SNRI.    [name] Normocytic anemia  03/2016   Mild-pt needs ferritin and vit B12 level checked (as of 03/22/16)  . NSTEMI (non-ST elevated myocardial infarction) ([place]) 03/20/2016   BMS to RCA  . Obesity hypoventilation syndrome ([place])   . Obesity, unspecified   . Orthostatic hypotension   . OSA on CPAP    8 cm H2O  . OSTEOARTHRITIS   . Paroxysmal atrial fibrillation ([place]) 2003    (? chronic?) Off anticoag for a while due to falls.  Then apixaban started 12/2014.  [name] Peripheral  neuropathy    DPN (+[facility]; with chronic neuropathic pain--Dr. Ella Bodo): neuropathic pain->diff to treat, failed nucynta, failed spinal stimul trial, oxycontin hs + tramadol + gabap as of 12/2017 f/u Dr. Letta Pate.  . Personal history of colonic adenoma 10/30/2012   Diminutive adenoma, consider repeat 2019 per GI  . Pneumonia   . Presence of cardiac defibrillator 11/07/2017  . Primary osteoarthritis of both knees    Bone on bone of medial compartments, + signif patellofemoral arth bilat.--supartz inj series started 09/12/17  . PUD (peptic ulcer disease)   . PULMONARY HYPERTENSION, HX OF   . Secondary male hypogonadism 2017  . 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 Shortness of breath    with exertion: much improved s/p TAVR and treatment for CHF.  . Sick sinus syndrome (HCC)    PPM placed  . Thrombocytopenia ([place]) 2018   HSM on 2007 abd u/s---suspect some mild splenic sequestration chronically.  [name] Unspecified glaucoma(365.9)   . Unspecified hereditary and idiopathic peripheral neuropathy approx age 32   bilat LE's, ? left arm, too.  Feet became progressively numb + left foot pain intermittently.  Pt may be trying a spinal stimulator (as of 05/2015)  . VENOUS INSUFFICIENCY    Being followed by Dr. Sharol Given as of 10/2016 for two R LL venous stasis ulcers/skin tears.  Healed as of 10/30/16 f/u with Dr. Sharol Given.  . VENTRAL HERNIA     Past Surgical History:  Procedure Laterality Date  . AMPUTATION Left 04/11/2013   Procedure: AMPUTATION DIGIT Left 3rd toe;  Surgeon: [name], MD;  Location: [place];  Service: Orthopedics;  Laterality: Left;  Left 3rd toe amputation at [facility]  . AMPUTATION Left 08/29/2019   Procedure: LEFT TRANSMETATARSAL AMPUTATION;  Surgeon: [name], MD;  Location: [place];  Service: Orthopedics;  Laterality: Left;  . CARDIAC CATHETERIZATION  1997; 03/10/16   1997 Non-obstructive disease.  03/2016 BMS to RCA,  with 25% pDiag dz, o/w normal cors per cath 03/07/16.  Cath 11/01/16: in stent restenosis, successful baloon angioplasty.  [name] CARDIAC CATHETERIZATION  12/24/2012   mild < 20% LCx, prox 30% RCA; LVEF 55-65% , moderate pulmonary HTN, moderate AS  . CARDIAC DEFIBRILLATOR PLACEMENT  11/07/2017   [facility] MRI Quad CRT defibrillator  . CARDIOVASCULAR STRESS TEST  05/11/16 ([facility])   Myocardial perfusion imaging:  No ischemia; scar in apex, global hypokinesis, EF 36%.  . Carotid dopplers  03/09/2016   [facility]: no hemodynamically significant stenosis on either side.  . CHOLECYSTECTOMY    . COLONOSCOPY    . COLONOSCOPY [name] 10/30/2012   Procedure: COLONOSCOPY;  Surgeon: [name], MD;  Location: [facility];  Service: Endoscopy;  Laterality: [name];  . CORONARY ANGIOPLASTY WITH STENT PLACEMENT  03/2016; 04/2017   2017-[facility]: BMS to RCA-pt was placed on Brilinta.  04/2017: DES to RCA.  [name] EYE SURGERY Bilateral cataract  . HEMORRHOID  SURGERY    . INTRAOCULAR LENS INSERTION Bilateral   . KNEE SURGERY Right   . LEFT AND RIGHT HEART CATHETERIZATION WITH CORONARY ANGIOGRAM [name] 12/24/2012   Procedure: LEFT AND RIGHT HEART CATHETERIZATION WITH CORONARY ANGIOGRAM;  Surgeon: [name], MD;  Location: [facility] CATH LAB;  Service: Cardiovascular;  Laterality: [name];  . LEG SURGERY Bilateral    lenghtening   . PACEMAKER PLACEMENT  04/13/2016   2nd deg HB after TAVR, pt had DC MDT PPM placed.  [name] SHOULDER ARTHROSCOPY  08/30/2011   Procedure: ARTHROSCOPY SHOULDER;  Surgeon: [name], MD;  Location: [place];  Service: Orthopedics;  Laterality: Right;  Right Shoulder Arthroscopy, Debridement, and Decompression  . SPINAL CORD STIMULATOR INSERTION [name] 09/10/2015   Procedure: LUMBAR SPINAL CORD STIMULATOR INSERTION;  Surgeon: [name], MD;  Location: [place] [facility];  Service: Neurosurgery;  Laterality: [name];  . 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  R big toe surg due to osteoarth  . TONSILLECTOMY    . traeculectomy  Left    eye  . TRANSCATHETER AORTIC VALVE REPLACEMENT, TRANSFEMORAL  04/11/2016  . TRANSESOPHAGEAL ECHOCARDIOGRAM  03/09/2016   [facility]: EF 55-60%, PFO seen with bi-directional shunting, no thrombus in appendage.  . TRANSTHORACIC ECHOCARDIOGRAM  01/2015; 01/2016; 05/18/16; 09/18/16, 05/2017, 08/2017   01/2015 No signif change in aortic stenosis (moderate).  01/2016 Severe LVH w/small LV cavity, EF 60-65%, grade I diast dysfxn.  05/2016 (s/p TAVR): EF 50-55%, grd I DD, biopros AV good.  08/2016--EF 50-55%, LV septal motion c/w conduction abnl, grd I DD,mild MS,bioprosth aortic valve well seated, w/trace AR. 05/2017 TTE EF 35%. 08/2017-EF 35%, mod diff hypokin LV, grd I DD, biopros AV good.   . TRANSTHORACIC ECHOCARDIOGRAM     04/2018: EF 40-45%, mod diffuse LV hypokinesis, grd I DD, bioprosth AV well seated, no AS or AR.  [name] VITRECTOMY       reports that he has never smoked. He has never used smokeless tobacco. He reports that he does not drink alcohol or use drugs.  Allergies  Allergen Reactions  . Brimonidine Tartrate Shortness Of Breath    Alphagan-Shortness of breath  . Brimonidine Tartrate Palpitations  . Brinzolamide Shortness Of Breath    AZOPT- Shortness of breath  . Latanoprost Shortness Of Breath    XALATAN- Shortness of breath  . Nucynta [Tapentadol] Shortness Of Breath  . Sulfa Antibiotics Palpitations  . Timolol Maleate Shortness Of Breath and Other (See Comments)    TIMOPTIC- Aggravated asthma  . Cardizem  [Diltiazem Hcl] Swelling  . Diltiazem Swelling     leg swelling  . Rofecoxib Swelling     VIOXX- leg swelling  . Vancomycin Hives and Other (See Comments)    Possible "Red Man Syndrome"? > hives/blisters  . Codeine Other (See Comments)    Childhood reaction  . Celecoxib Other (See Comments)    CELLBREX-confusion  . Colchicine Diarrhea    diarrhea  . Tape Rash    Family History  Problem Relation Age of Onset  . Hypertension Mother   . Coronary artery disease Mother    . Heart attack Mother   . Neuropathy Mother   . Pulmonary fibrosis Father        asbestosis    Prior to Admission medications   Medication Sig Start Date End Date Taking? Authorizing Provider  albuterol (PROVENTIL HFA;VENTOLIN HFA) 108 (90 BASE) MCG/ACT inhaler Inhale 2 puffs into the lungs 4 (four) times daily as needed for wheezing or  shortness of breath.   Yes [provider]  allopurinol (ZYLOPRIM) 300 MG tablet TAKE 1 TABLET BY MOUTH ONCE DAILY WITH FOOD Patient taking differently: Take 300 mg by mouth daily.  06/12/19  Yes McGowen, Adrian Blackwater, MD  apixaban (ELIQUIS) 5 MG TABS tablet Take 1 tablet (5 mg total) by mouth 2 (two) times daily. 01/02/17  Yes Lelon Perla, MD  ASPIRIN LOW DOSE 81 MG EC tablet Take 81 mg by mouth daily. 02/03/19  Yes [provider]  B Complex-C (B-COMPLEX WITH VITAMIN C) tablet Take 1 tablet by mouth daily. Take 1 tablet daily, 263m   Yes [provider]  cyclobenzaprine (FLEXERIL) 5 MG tablet Take 0.5 tablet to 1 tablet at bedtime  for muscle spasm. Patient taking differently: Take 5 mg by mouth at bedtime. Take 0.5 tablet to 1 tablet at bedtime  for muscle spasm. 10/21/18  Yes TBayard Hugger NP  ezetimibe (ZETIA) 10 MG tablet take 1 tablet by mouth once daily Patient taking differently: Take 10 mg by mouth daily.  01/18/17  Yes CLelon Perla MD  finasteride (PROSCAR) 5 MG tablet TAKE 1 TABLET BY MOUTH EVERY DAY Patient taking differently: Take 5 mg by mouth daily.  09/08/19  Yes McGowen, PAdrian Blackwater MD  fluconazole (DIFLUCAN) 200 MG tablet Take 1 tablet (200 mg total) by mouth daily for 7 days. 09/09/19 09/16/19 Yes FJacqlyn Larsen PA-C  fluticasone (FLONASE) 50 MCG/ACT nasal spray Place 2 sprays into both nostrils as needed for allergies or rhinitis.   Yes [provider]  gabapentin (NEURONTIN) 800 MG tablet TAKE 1 TABLET (800 MG TOTAL) BY MOUTH 4 (FOUR) TIMES DAILY. 04/21/19  Yes Kirsteins, ALuanna Salk MD  isosorbide  mononitrate (IMDUR) 60 MG 24 hr tablet TAKE 1 TABLET BY MOUTH EVERY DAY Patient taking differently: Take 60 mg by mouth daily.  08/28/19  Yes McGowen, PAdrian Blackwater MD  metoprolol succinate (TOPROL-XL) 25 MG 24 hr tablet Take 25 mg by mouth daily. Along w/ 553mto equal 7540maily   Yes [provider]  metoprolol succinate (TOPROL-XL) 50 MG 24 hr tablet Take 50 mg by mouth daily. Take along with the 36m49mblet to make total dose of 75mg2mes [provider]  Multiple Vitamin (MULTIVITAMIN) capsule Take 1 capsule by mouth daily.   Yes [provider]  niacin (NIASPAN) 1000 MG CR tablet TAKE 1 TABLET BY MOUTH TWICE A DAY Patient taking differently: Take 1,000 mg by mouth 2 (two) times daily.  04/21/19  Yes McGowen, PhiliAdrian Blackwater nitroGLYCERIN (NITROSTAT) 0.4 MG SL tablet Place 0.4 mg under the tongue every 5 (five) minutes as needed for chest pain.  09/04/17  Yes [provider]  nystatin (MYCOSTATIN/NYSTOP) powder Apply 1 application topically 3 (three) times daily. 09/09/19  Yes Ford,Jacqlyn LarsenC  omeprazole (PRILOSEC) 10 MG capsule TAKE 1 CAPSULE BY MOUTH EVERY DAY Patient taking differently: Take 10 mg by mouth daily.  05/05/19  Yes McGowen, PhiliAdrian Blackwater rosuvastatin (CRESTOR) 40 MG tablet take 1 tablet by mouth once daily Patient taking differently: Take 40 mg by mouth daily.  02/05/17  Yes CrensLelon Perla sacubitril-valsartan (ENTRESTO) 24-26 MG Take 1 tablet by mouth 2 (two) times daily. 07/19/17  Yes [provider]  VITAMIN D, CHOLECALCIFEROL, PO Take 1 tablet by mouth daily.    Yes [provider]  cadexomer iodine (IODOSORB) 0.9 % gel Apply 1 application topically daily as  needed for wound care. Apply to the affected area daily plus dry dressing Patient not taking: Reported on 09/09/2019 08/19/19   Persons, Bevely Palmer, PA  clotrimazole-betamethasone (LOTRISONE) cream apply to affected area twice a day Patient not taking: No sig reported  11/04/15   McGowen, Adrian Blackwater, MD  doxycycline (VIBRAMYCIN) 100 MG capsule Take 1 capsule (100 mg total) by mouth 2 (two) times daily. Patient not taking: Reported on 09/09/2019 08/14/19   Orpah Greek, MD  finasteride (PROSCAR) 5 MG tablet Take 1 tablet (5 mg total) by mouth daily. Patient not taking: Reported on 09/09/2019 09/05/19   Tammi Sou, MD  KLOR-CON M20 20 MEQ tablet TAKE 1 TABLET BY MOUTH EVERY DAY Patient not taking: Reported on 09/09/2019 09/08/19   Tammi Sou, MD  oxyCODONE-acetaminophen (PERCOCET) 5-325 MG tablet Take 1 tablet by mouth every 4 (four) hours as needed. Patient not taking: Reported on 09/09/2019 08/29/19 08/28/20  Persons, Bevely Palmer, [place]    Physical Exam: Constitutional: Moderately built and nourished. Vitals:   09/12/19 0330 09/12/19 0400 09/12/19 0426 09/12/19 0430  BP: (!) 104/46 98/61  (!) 107/55  Pulse: 97 94  93  Resp: 18 17  16   Temp:   100 F (37.8 C)   TempSrc:   Oral   SpO2: 95% 93%  95%  Weight:      Height:       Eyes: Anicteric no pallor. ENMT: No discharge from the ears eyes nose or mouth. Neck: No masses.  No neck rigidity. Respiratory: No rhonchi or crepitations. Cardiovascular: S1-S2 heard. Abdomen: Soft nontender bowel sound present. Musculoskeletal: No edema. Skin: No foot mild erythema. Neurologic: Alert awake oriented time place and person.  Moves all extremities. Psychiatric: Appears normal.   Labs on Admission: I have personally reviewed following labs and imaging studies  CBC: Recent Labs  Lab 09/09/19 1102 09/11/19 2343  WBC 9.4 13.9*  NEUTROABS  --  11.6*  HGB 12.4* 10.8*  HCT 38.9* 33.9*  MCV 94.0 95.8  PLT 155 709*   Basic Metabolic Panel: Recent Labs  Lab 09/09/19 1102 09/11/19 2343  NA 139 138  K 4.4 4.2  CL 107 102  CO2 22 27  GLUCOSE 158* 146*  BUN 15 19  CREATININE 1.07 1.08  CALCIUM 8.9 8.5*   GFR: Estimated Creatinine Clearance: 83.8 mL/min (by C-G formula based on SCr of 1.08  mg/dL). Liver Function Tests: Recent Labs  Lab 09/11/19 2343  AST 127*  ALT 101*  ALKPHOS 133*  BILITOT 1.0  PROT 6.3*  ALBUMIN 3.0*   No results for input(s): LIPASE, AMYLASE in the last 168 hours. No results for input(s): AMMONIA in the last 168 hours. Coagulation Profile: Recent Labs  Lab 09/11/19 2343  INR 1.2   Cardiac Enzymes: No results for input(s): CKTOTAL, CKMB, CKMBINDEX, TROPONINI in the last 168 hours. BNP (last 3 results) No results for input(s): PROBNP in the last 8760 hours. HbA1C: No results for input(s): HGBA1C in the last 72 hours. CBG: No results for input(s): GLUCAP in the last 168 hours. Lipid Profile: No results for input(s): CHOL, HDL, LDLCALC, TRIG, CHOLHDL, LDLDIRECT in the last 72 hours. Thyroid Function Tests: No results for input(s): TSH, T4TOTAL, FREET4, T3FREE, THYROIDAB in the last 72 hours. Anemia Panel: No results for input(s): VITAMINB12, FOLATE, FERRITIN, TIBC, IRON, RETICCTPCT in the last 72 hours. Urine analysis:    Component Value Date/Time   COLORURINE RED (A) 09/11/2019 2338   APPEARANCEUR TURBID (A) 09/11/2019 2338  LABSPEC  09/11/2019 2338    TEST NOT REPORTED DUE TO COLOR INTERFERENCE OF URINE PIGMENT   PHURINE  09/11/2019 2338    TEST NOT REPORTED DUE TO COLOR INTERFERENCE OF URINE PIGMENT   GLUCOSEU (A) 09/11/2019 2338    TEST NOT REPORTED DUE TO COLOR INTERFERENCE OF URINE PIGMENT   GLUCOSEU NEGATIVE 05/25/2006 1353   HGBUR (A) 09/11/2019 2338    TEST NOT REPORTED DUE TO COLOR INTERFERENCE OF URINE PIGMENT   BILIRUBINUR (A) 09/11/2019 2338    TEST NOT REPORTED DUE TO COLOR INTERFERENCE OF URINE PIGMENT   BILIRUBINUR negative 12/25/2018 1408   KETONESUR (A) 09/11/2019 2338    TEST NOT REPORTED DUE TO COLOR INTERFERENCE OF URINE PIGMENT   PROTEINUR (A) 09/11/2019 2338    TEST NOT REPORTED DUE TO COLOR INTERFERENCE OF URINE PIGMENT   UROBILINOGEN 0.2 12/25/2018 1408   UROBILINOGEN 0.2 mg/dL 05/25/2006 1353   NITRITE  (A) 09/11/2019 2338    TEST NOT REPORTED DUE TO COLOR INTERFERENCE OF URINE PIGMENT   LEUKOCYTESUR (A) 09/11/2019 2338    TEST NOT REPORTED DUE TO COLOR INTERFERENCE OF URINE PIGMENT   Sepsis Labs: @LABRCNTIP (procalcitonin:4,lacticidven:4) ) Recent Results (from the past 240 hour(s))  Urine culture     Status: Abnormal (Preliminary result)   Collection Time: 09/09/19  5:32 PM   Specimen: Urine, Clean Catch  Result Value Ref Range Status   Specimen Description URINE, CLEAN CATCH  Final   Special Requests NONE  Final   Culture (A)  Final    >=100,000 COLONIES/mL ENTEROCOCCUS FAECALIS CULTURE REINCUBATED FOR BETTER GROWTH Performed at [facility], [place] [address] [place], [place] [address]    Report Status PENDING  Incomplete   Organism ID, Bacteria ENTEROCOCCUS FAECALIS (A)  Final      Susceptibility   Enterococcus faecalis - MIC*    AMPICILLIN <=2 SENSITIVE Sensitive     NITROFURANTOIN <=16 SENSITIVE Sensitive     VANCOMYCIN 1 SENSITIVE Sensitive     * >=100,000 COLONIES/mL ENTEROCOCCUS FAECALIS     Radiological Exams on Admission: DG Chest Port 1 View  Result Date: 09/12/2019 CLINICAL DATA:  Fever. EXAM: PORTABLE CHEST 1 VIEW COMPARISON:  December 23, 2012 FINDINGS: The heart size is enlarged. 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 There is no pneumothorax. There is prominence of the perihilar regions bilaterally which is nonspecific. There is no focal infiltrate. IMPRESSION: 1. No definite acute cardiopulmonary process. 2. Prominent hila bilaterally. This is nonspecific but may be secondary to pulmonary artery hypertension. An outpatient two-view chest x-ray is recommended for further evaluation. Electronically Signed   By: Constance Holster M.D.   On: 09/12/2019 00:26   DG Foot Complete Left  Result Date: 09/12/2019 CLINICAL DATA:  Infection. EXAM: LEFT FOOT - COMPLETE 3+ VIEW COMPARISON:  08/26/2019 FINDINGS: The patient  is status post prior transmetatarsal amputation of the left foot. There is some surrounding soft tissue swelling. There is no radiographic evidence for osteomyelitis. There are no pockets of subcutaneous gas. There is a stable foreign body projecting within the plantar soft tissues at the level of the midfoot. There is a moderate-sized plantar calcaneal spur. IMPRESSION: 1. Postsurgical changes as detailed above without evidence for osteomyelitis. 2. Nonspecific soft tissue swelling is noted about the foot. 3. Stable foreign body in the plantar soft tissues as detailed above. Electronically Signed   By: Constance Holster M.D.   On: 09/12/2019 00:27  Assessment/Plan Principal Problem:   Sepsis ([place]) Active Problems:   Essential hypertension, benign   COPD (chronic obstructive pulmonary disease) (HCC)   CAD (coronary artery disease), native coronary artery   Paroxysmal atrial fibrillation (HCC)   Moderate aortic stenosis   Acute lower UTI    1. Sepsis secondary to UTI and possible cellulitis of left stump after transmetatarsal amputation for recent osteomyelitis.  Recent urine cultures showing Enterococcus.  Discussed with pharmacy since patient is allergic to vancomycin patient was placed on daptomycin further doses for which we need ID consult.  Patient is also on Zosyn.  Follow cultures.  Received fluids.  Further fluids will be cautiously given since patient has history of cardiomyopathy.  Lactic acid normal.  Patient has indwelling Foley catheter at presentation.  Since patient had confusion with fever with low normal blood pressure all consistent with septic picture.  If patient is left stump after transmetatarsal amputation shows worsening of the erythema or patient has persistent fever may have to do CT of the left stump. 2. History of cardiomyopathy last EF measured in 2019 per notes in care everywhere was 40 to 45%.  Status post pacemaker placement and ICD.  Presently holding Entresto  and Imdur due to blood pressure being in the low normal.  We will continue beta-blockers with holding parameters. 3. History of hypertension see number to be regarding antihypertensives. 4. Paroxysmal atrial fibrillation presently rate controlled.  On beta-blockers with holding parameters if blood pressures in the low normal.  Apixaban was on hold for possible procedure by urologist next week for his balanitis/phimosis. 5. Anemia appears to be chronic.  Note that patient does have mild hematuria since the Foley catheter was placed follow CBC. 6. Diabetes mellitus type 2 we will keep patient on sliding scale coverage. 7. Status post TAVR. 8. CAD status post stenting denies any chest pain.  Since patient does have some hematuria I am holding off aspirin for now and make sure there is no further worsening of hemoglobin.  Given the septic picture patient will need close monitoring and inpatient status.   DVT prophylaxis: SCDs for now since patient is planned to have procedure as outpatient avoiding anticoagulation. Code Status: Full code. Family Communication: Patient's wife. Disposition Plan: Home. Consults called: None. Admission status: Inpatient.   Rise Patience MD [facility] Pager [phone].  If 7PM-7AM, please contact night-coverage [email or web address] Password [facility]  09/12/2019, 4:55 AM

## 2019-09-12 NOTE — Consult Note (Signed)
ORTHOPAEDIC CONSULTATION  REQUESTING PHYSICIAN: Modena Jansky, MD  Chief Complaint: Urinary tract infection 2-1/2 weeks status post left transmetatarsal amputation.  HPI: Tim [name] is a 79 y.o. male who presents with urinary tract infection with venous stasis swelling of both lower extremities status post left transmetatarsal amputation 2-1/2 weeks ago.  Past Medical History:  Diagnosis Date  . AICD (automatic cardioverter/defibrillator) present   . ASTHMA   . Asthma    as a child  . BPH (benign prostatic hypertrophy)   . CAD (coronary artery disease)    Nonobstructive by 12/2012 cath; then 03/2016 he required BMS to RCA ([facility]).  In-stent restenosis on cath 11/01/16, baloon angioplasty successful--pt to take plavix (no ASA), + eliquis (for PAF).  [name] CATARACT, HX OF   . Chronic combined systolic and diastolic CHF (congestive heart failure) ([place]) 05/2016   Ischemic CM.  04/2018 EF 40-45%, grd I DD.  [name] Chronic pain syndrome    Lumbar DDD; chronic neuropathic pain (DM); has spinal stimulator and sees pain mgmt MD  . COPD   . Diabetes mellitus type 2 with complications (HCC)    [id number] jumped from 5.7% to 6.1% 03/2015---started metformin at that time.  DM 2 dx by fasting gluc criteria 2018.  Has chronic neuropath pain  . Elevated transaminase level 02/2016   Suspect due to fatty liver (documented on u/s 2007). Plan repeat labs 03/2016.  [name] Essential hypertension, benign   . Fatty liver 2007   2007 u/s showed fatty liver with hepatosplenomegaly.  2019 repeat u/s->fatty liver but no cirrhosis or hepatosplenomegaly.  [name] GERD (gastroesophageal reflux disease)   . Glaucoma   . GOUT   . Hallux valgus (acquired)   . HH (hiatus hernia)   . HYPERCHOLESTEROLEMIA-PURE   . Hypogonadism male   . Lumbosacral neuritis   . Lumbosacral spondylosis    Lumbar spinal stenosis with neurogenic claudication--contributes to his chronic pain syndrome  . Normal memory function 08/2014    Neuropsychological testing ([facility]): no cognitive impairment or sign of neurodegenerative disorder.  Likely has adjustment d/o with mixed anxiety/depressed features and may benefit from low dose SNRI.    [name] Normocytic anemia 03/2016   Mild-pt needs ferritin and vit B12 level checked (as of 03/22/16)  . NSTEMI (non-ST elevated myocardial infarction) ([place]) 03/20/2016   BMS to RCA  . Obesity hypoventilation syndrome ([place])   . Obesity, unspecified   . Orthostatic hypotension   . OSA on CPAP    8 cm H2O  . OSTEOARTHRITIS   . Paroxysmal atrial fibrillation ([place]) 2003    (? chronic?) Off anticoag for a while due to falls.  Then apixaban started 12/2014.  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 Ella Bodo): neuropathic pain->diff to treat, failed nucynta, failed spinal stimul trial, oxycontin hs + tramadol + gabap as of 12/2017 f/u Dr. Letta Pate.  . Personal history of colonic adenoma 10/30/2012   Diminutive adenoma, consider repeat 2019 per GI  . Pneumonia   . Presence of cardiac defibrillator 11/07/2017  . Primary osteoarthritis of both knees    Bone on bone of medial compartments, + signif patellofemoral arth bilat.--supartz inj series started 09/12/17  . PUD (peptic ulcer disease)   . PULMONARY HYPERTENSION, HX OF   . Secondary male hypogonadism 2017  . 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 Shortness of breath    with  exertion: much improved s/p TAVR and treatment for CHF.  . Sick sinus syndrome (HCC)    PPM placed  . Thrombocytopenia ([place]) 2018   HSM on 2007 abd u/s---suspect some mild splenic sequestration chronically.  [name] Unspecified glaucoma(365.9)   . Unspecified hereditary and idiopathic peripheral neuropathy approx age 22   bilat LE's, ? left arm, too.  Feet became progressively numb + left foot pain intermittently.  Pt may be trying a spinal stimulator (as of  05/2015)  . VENOUS INSUFFICIENCY    Being followed by Dr. Sharol Given as of 10/2016 for two R LL venous stasis ulcers/skin tears.  Healed as of 10/30/16 f/u with Dr. Sharol Given.  . VENTRAL HERNIA    Past Surgical History:  Procedure Laterality Date  . AMPUTATION Left 04/11/2013   Procedure: AMPUTATION DIGIT Left 3rd toe;  Surgeon: [name], MD;  Location: [facility];  Service: Orthopedics;  Laterality: Left;  Left 3rd toe amputation at [facility]  . AMPUTATION Left 08/29/2019   Procedure: LEFT TRANSMETATARSAL AMPUTATION;  Surgeon: [name], MD;  Location: [facility];  Service: Orthopedics;  Laterality: Left;  . CARDIAC CATHETERIZATION  1997; 03/10/16   1997 Non-obstructive disease.  03/2016 BMS to RCA, with 25% pDiag dz, o/w normal cors per cath 03/07/16.  Cath 11/01/16: in stent restenosis, successful baloon angioplasty.  [name] CARDIAC CATHETERIZATION  12/24/2012   mild < 20% LCx, prox 30% RCA; LVEF 55-65% , moderate pulmonary HTN, moderate AS  . CARDIAC DEFIBRILLATOR PLACEMENT  11/07/2017   [facility] MRI Quad CRT defibrillator  . CARDIOVASCULAR STRESS TEST  05/11/16 ([facility])   Myocardial perfusion imaging:  No ischemia; scar in apex, global hypokinesis, EF 36%.  . Carotid dopplers  03/09/2016   [facility]: no hemodynamically significant stenosis on either side.  . CHOLECYSTECTOMY    . COLONOSCOPY    . COLONOSCOPY [name] 10/30/2012   Procedure: COLONOSCOPY;  Surgeon: [name], MD;  Location: [facility];  Service: Endoscopy;  Laterality: [name];  . CORONARY ANGIOPLASTY WITH STENT PLACEMENT  03/2016; 04/2017   2017-[facility]: BMS to RCA-pt was placed on Brilinta.  04/2017: DES to RCA.  [name] EYE SURGERY Bilateral cataract  . HEMORRHOID SURGERY    . INTRAOCULAR LENS INSERTION Bilateral   . KNEE SURGERY Right   . LEFT AND RIGHT HEART CATHETERIZATION WITH CORONARY ANGIOGRAM [name] 12/24/2012   Procedure: LEFT AND RIGHT HEART CATHETERIZATION WITH CORONARY ANGIOGRAM;  Surgeon: [name], MD;  Location: [facility] CATH LAB;  Service:  Cardiovascular;  Laterality: [name];  . LEG SURGERY Bilateral    lenghtening   . PACEMAKER PLACEMENT  04/13/2016   2nd deg HB after TAVR, pt had DC MDT PPM placed.  [name] SHOULDER ARTHROSCOPY  08/30/2011   Procedure: ARTHROSCOPY SHOULDER;  Surgeon: [name], MD;  Location: [facility];  Service: Orthopedics;  Laterality: Right;  Right Shoulder Arthroscopy, Debridement, and Decompression  . SPINAL CORD STIMULATOR INSERTION [name] 09/10/2015   Procedure: LUMBAR SPINAL CORD STIMULATOR INSERTION;  Surgeon: [name], MD;  Location: [facility];  Service: Neurosurgery;  Laterality: [name];  . 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  R big toe surg due to osteoarth  . TONSILLECTOMY    . traeculectomy Left    eye  . TRANSCATHETER AORTIC VALVE REPLACEMENT, TRANSFEMORAL  04/11/2016  . TRANSESOPHAGEAL ECHOCARDIOGRAM  03/09/2016   [facility]: EF 55-60%, PFO seen with bi-directional shunting, no thrombus in appendage.  . TRANSTHORACIC ECHOCARDIOGRAM  01/2015; 01/2016; 05/18/16; 09/18/16, 05/2017, 08/2017   01/2015  No signif change in aortic stenosis (moderate).  01/2016 Severe LVH w/small LV cavity, EF 60-65%, grade I diast dysfxn.  05/2016 (s/p TAVR): EF 50-55%, grd I DD, biopros AV good.  08/2016--EF 50-55%, LV septal motion c/w conduction abnl, grd I DD,mild MS,bioprosth aortic valve well seated, w/trace AR. 05/2017 TTE EF 35%. 08/2017-EF 35%, mod diff hypokin LV, grd I DD, biopros AV good.   . TRANSTHORACIC ECHOCARDIOGRAM     04/2018: EF 40-45%, mod diffuse LV hypokinesis, grd I DD, bioprosth AV well seated, no AS or AR.  [name] VITRECTOMY     Social History   Socioeconomic History  . Marital status: Married    Spouse name: Not on file  . Number of children: 0  . Years of education: 4  . Highest education level: Not on file  Occupational History  . Occupation: Engineer, production    Comment: retired  Tobacco Use  . Smoking status: Never Smoker  . Smokeless tobacco: Never Used  Substance and Sexual Activity  . Alcohol  use: No    Alcohol/week: 0.0 standard drinks  . Drug use: No    Types: Oxycodone  . Sexual activity: Not Currently  Other Topics Concern  . Not on file  Social History Narrative   HSG, [name] - BS, [name] - MS-engineering, 2 years on PhD - [employer]. Married - '65 - 43yr/divorced; '76- 3 yrs/divorced; '92 . No children. 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 ACP/Living Will - Yes CPR; long-term Mechanical ventilation as long as he was able to cognate; ok for long term artificial nutrition. Precondition being able to cognate and not to have too much pain.    Social Determinants of Health   Financial Resource Strain:   . Difficulty of Paying Living Expenses:   Food Insecurity:   . Worried About RCharity fundraiserin the Last Year:   . RArboriculturistin the Last Year:   Transportation Needs:   . LFilm/video editor(Medical):   .Marland KitchenLack of Transportation (Non-Medical):   Physical Activity:   . Days of Exercise per Week:   . Minutes of Exercise per Session:   Stress:   . Feeling of Stress :   Social Connections:   . Frequency of Communication with Friends and Family:   . Frequency of Social Gatherings with Friends and Family:   . Attends Religious Services:   . Active Member of Clubs or Organizations:   . Attends CArchivistMeetings:   .Marland KitchenMarital Status:    Family History  Problem Relation Age of Onset  . Hypertension Mother   . Coronary artery disease Mother   . Heart attack Mother   . Neuropathy Mother   . Pulmonary fibrosis Father        asbestosis   - negative except otherwise stated in the family history section Allergies  Allergen Reactions  . Brimonidine Tartrate Shortness Of Breath    Alphagan-Shortness of breath  . Brimonidine Tartrate Palpitations  . Brinzolamide Shortness Of Breath    AZOPT- Shortness of breath  . Latanoprost Shortness Of Breath    XALATAN- Shortness of breath  . Nucynta  [Tapentadol] Shortness Of Breath  . Sulfa Antibiotics Palpitations  . Timolol Maleate Shortness Of Breath and Other (See Comments)    TIMOPTIC- Aggravated asthma  . Cardizem  [Diltiazem Hcl] Swelling  . Diltiazem Swelling     leg swelling  . Rofecoxib Swelling  VIOXX- leg swelling  . Vancomycin Hives and Other (See Comments)    Possible "Red Man Syndrome"? > hives/blisters  . Codeine Other (See Comments)    Childhood reaction  . Celecoxib Other (See Comments)    CELLBREX-confusion  . Colchicine Diarrhea    diarrhea  . Tape Rash   Prior to Admission medications   Medication Sig Start Date End Date Taking? Authorizing Provider  albuterol (PROVENTIL HFA;VENTOLIN HFA) 108 (90 BASE) MCG/ACT inhaler Inhale 2 puffs into the lungs 4 (four) times daily as needed for wheezing or shortness of breath.   Yes [provider]  allopurinol (ZYLOPRIM) 300 MG tablet TAKE 1 TABLET BY MOUTH ONCE DAILY WITH FOOD Patient taking differently: Take 300 mg by mouth daily.  06/12/19  Yes McGowen, Adrian Blackwater, MD  apixaban (ELIQUIS) 5 MG TABS tablet Take 1 tablet (5 mg total) by mouth 2 (two) times daily. 01/02/17  Yes Lelon Perla, MD  ASPIRIN LOW DOSE 81 MG EC tablet Take 81 mg by mouth daily. 02/03/19  Yes [provider]  B Complex-C (B-COMPLEX WITH VITAMIN C) tablet Take 1 tablet by mouth daily. Take 1 tablet daily, 258m   Yes [provider]  cyclobenzaprine (FLEXERIL) 5 MG tablet Take 0.5 tablet to 1 tablet at bedtime  for muscle spasm. Patient taking differently: Take 5 mg by mouth at bedtime. Take 0.5 tablet to 1 tablet at bedtime  for muscle spasm. 10/21/18  Yes TBayard Hugger NP  ezetimibe (ZETIA) 10 MG tablet take 1 tablet by mouth once daily Patient taking differently: Take 10 mg by mouth daily.  01/18/17  Yes CLelon Perla MD  finasteride (PROSCAR) 5 MG tablet TAKE 1 TABLET BY MOUTH EVERY DAY Patient taking differently: Take 5 mg by mouth daily.  09/08/19  Yes  McGowen, PAdrian Blackwater MD  fluconazole (DIFLUCAN) 200 MG tablet Take 1 tablet (200 mg total) by mouth daily for 7 days. 09/09/19 09/16/19 Yes FJacqlyn Larsen PA-C  fluticasone (FLONASE) 50 MCG/ACT nasal spray Place 2 sprays into both nostrils as needed for allergies or rhinitis.   Yes [provider]  gabapentin (NEURONTIN) 800 MG tablet TAKE 1 TABLET (800 MG TOTAL) BY MOUTH 4 (FOUR) TIMES DAILY. 04/21/19  Yes Kirsteins, ALuanna Salk MD  isosorbide mononitrate (IMDUR) 60 MG 24 hr tablet TAKE 1 TABLET BY MOUTH EVERY DAY Patient taking differently: Take 60 mg by mouth daily.  08/28/19  Yes McGowen, PAdrian Blackwater MD  metoprolol succinate (TOPROL-XL) 25 MG 24 hr tablet Take 25 mg by mouth daily. Along w/ 57mto equal 7578maily   Yes [provider]  metoprolol succinate (TOPROL-XL) 50 MG 24 hr tablet Take 50 mg by mouth daily. Take along with the 71m54mblet to make total dose of 75mg83mes [provider]  Multiple Vitamin (MULTIVITAMIN) capsule Take 1 capsule by mouth daily.   Yes [provider]  niacin (NIASPAN) 1000 MG CR tablet TAKE 1 TABLET BY MOUTH TWICE A DAY Patient taking differently: Take 1,000 mg by mouth 2 (two) times daily.  04/21/19  Yes McGowen, PhiliAdrian Blackwater nitroGLYCERIN (NITROSTAT) 0.4 MG SL tablet Place 0.4 mg under the tongue every 5 (five) minutes as needed for chest pain.  09/04/17  Yes [provider]  nystatin (MYCOSTATIN/NYSTOP) powder Apply 1 application topically 3 (three) times daily. 09/09/19  Yes Ford,Jacqlyn LarsenC  omeprazole (PRILOSEC) 10 MG capsule TAKE 1 CAPSULE BY MOUTH EVERY DAY Patient taking differently:  Take 10 mg by mouth daily.  05/05/19  Yes McGowen, Adrian Blackwater, MD  rosuvastatin (CRESTOR) 40 MG tablet take 1 tablet by mouth once daily Patient taking differently: Take 40 mg by mouth daily.  02/05/17  Yes Lelon Perla, MD  sacubitril-valsartan (ENTRESTO) 24-26 MG Take 1 tablet by mouth 2 (two) times daily. 07/19/17  Yes [provider]  VITAMIN D, CHOLECALCIFEROL, PO Take 1 tablet by mouth daily.    Yes [provider]  cadexomer iodine (IODOSORB) 0.9 % gel Apply 1 application topically daily as needed for wound care. Apply to the affected area daily plus dry dressing Patient not taking: Reported on 09/09/2019 08/19/19   Persons, Bevely Palmer, PA  clotrimazole-betamethasone (LOTRISONE) cream apply to affected area twice a day Patient not taking: No sig reported 11/04/15   McGowen, Adrian Blackwater, MD  doxycycline (VIBRAMYCIN) 100 MG capsule Take 1 capsule (100 mg total) by mouth 2 (two) times daily. Patient not taking: Reported on 09/09/2019 08/14/19   Orpah Greek, MD  finasteride (PROSCAR) 5 MG tablet Take 1 tablet (5 mg total) by mouth daily. Patient not taking: Reported on 09/09/2019 09/05/19   Tammi Sou, MD  KLOR-CON M20 20 MEQ tablet TAKE 1 TABLET BY MOUTH EVERY DAY Patient not taking: Reported on 09/09/2019 09/08/19   Tammi Sou, MD  oxyCODONE-acetaminophen (PERCOCET) 5-325 MG tablet Take 1 tablet by mouth every 4 (four) hours as needed. Patient not taking: Reported on 09/09/2019 08/29/19 08/28/20  Persons, Bevely Palmer, [place]   US RENAL  Result Date: 09/12/2019 CLINICAL DATA:  79 year old male with gross hematuria. EXAM: RENAL / URINARY TRACT ULTRASOUND COMPLETE COMPARISON:  09/09/2019 CT FINDINGS: Right Kidney: Renal measurements: 10.6 x 5.6 x 5.7 cm = volume: 177 mL . Echogenicity within normal limits. No mass or hydronephrosis visualized. Left Kidney: Renal measurements: 13.8 x 5.1 x 5.5 cm = volume: 202 mL. Echogenicity within normal limits. No mass or hydronephrosis visualized. Bladder: No gross bladder abnormalities are noted. Bladder volume is 566 cc. A Foley catheter tip is not identified within the bladder. Other: None. IMPRESSION: 1. Unremarkable kidneys. 2. Foley catheter tip is not identified within the bladder and tip may lie within the urethra. Correlate clinically. No gross bladder abnormality.  Electronically Signed   By: Margarette Canada M.D.   On: 09/12/2019 13:41   DG Chest Port 1 View  Result Date: 09/12/2019 CLINICAL DATA:  Fever. EXAM: PORTABLE CHEST 1 VIEW COMPARISON:  December 23, 2012 FINDINGS: The heart size is enlarged. 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 There is no pneumothorax. There is prominence of the perihilar regions bilaterally which is nonspecific. There is no focal infiltrate. IMPRESSION: 1. No definite acute cardiopulmonary process. 2. Prominent hila bilaterally. This is nonspecific but may be secondary to pulmonary artery hypertension. An outpatient two-view chest x-ray is recommended for further evaluation. Electronically Signed   By: Constance Holster M.D.   On: 09/12/2019 00:26   DG Foot Complete Left  Result Date: 09/12/2019 CLINICAL DATA:  Infection. EXAM: LEFT FOOT - COMPLETE 3+ VIEW COMPARISON:  08/26/2019 FINDINGS: The patient is status post prior transmetatarsal amputation of the left foot. There is some surrounding soft tissue swelling. There is no radiographic evidence for osteomyelitis. There are no pockets of subcutaneous gas. There is a stable foreign body projecting within the plantar soft tissues at the level of the midfoot. There is a moderate-sized plantar calcaneal spur. IMPRESSION: 1. Postsurgical  changes as detailed above without evidence for osteomyelitis. 2. Nonspecific soft tissue swelling is noted about the foot. 3. Stable foreign body in the plantar soft tissues as detailed above. Electronically Signed   By: Constance Holster M.D.   On: 09/12/2019 00:27   - pertinent xrays, CT, MRI studies were reviewed and independently interpreted  Positive ROS: All other systems have been reviewed and were otherwise negative with the exception of those mentioned in the HPI and as above.  Physical Exam: General: Alert, no acute distress Psychiatric: Patient is competent for consent with normal mood and  affect Lymphatic: No axillary or cervical lymphadenopathy Cardiovascular: No pedal edema Respiratory: No cyanosis, no use of accessory musculature GI: No organomegaly, abdomen is soft and non-tender    Images:  @ENCIMAGES @  Labs:  Lab Results  Component Value Date   HGBA1C 6.1 03/27/2019   HGBA1C 6.1 09/16/2018   HGBA1C 6.0 06/06/2018   ESRSEDRATE 20 02/09/2016   ESRSEDRATE 36 (H) 01/28/2014   LABURIC 3.5 (L) 09/10/2013   REPTSTATUS PENDING 09/11/2019   CULT PENDING 09/11/2019   LABORGA ENTEROCOCCUS FAECALIS (A) 09/09/2019    Lab Results  Component Value Date   ALBUMIN 2.8 (L) 09/12/2019   ALBUMIN 3.0 (L) 09/11/2019   ALBUMIN 3.5 08/29/2019   LABURIC 3.5 (L) 09/10/2013    Neurologic: Patient does not have protective sensation bilateral lower extremities.   MUSCULOSKELETAL:   Skin: Examination patient has brawny skin color changes in both lower extremities with pitting edema both legs are warm but there is no cellulitis no odor no drainage from the legs.  Examination of the left foot there is some mild ischemic changes along the incision of the transmetatarsal amputation however there is no cellulitis no drainage no wound dehiscence no signs of infection.  There is no tenderness to palpation in the foot or calf no clinical signs of DVT or infection.  Albumin 2.8  Assessment: Venous stasis insufficiency both legs without ulcers with healing left transmetatarsal amputation.  Plan: Plan: I will request a stump shrinker from [facility] to be applied to the left lower extremity and this should be worn around-the-clock as a sock.  Thank you for the consult and the opportunity to see Tim [name], [place] [phone] 3:26 PM

## 2019-09-12 NOTE — ED Notes (Signed)
Pt moved to a hospital bed and a recliner was placed in the room for his wife.

## 2019-09-12 NOTE — Sepsis Progress Note (Signed)
Notified bedside nurse of need to order fluid bolus.

## 2019-09-12 NOTE — Progress Notes (Addendum)
PROGRESS NOTE   Tim [name]  EXB:[id number]    DOB: [birth year]    DOA: 09/11/2019  PCP: Tammi Sou, MD   I have briefly reviewed patients previous medical records in [facility].  Chief Complaint:   Chief Complaint  Patient presents with  . Code Sepsis    Brief Narrative:  79 year old married male, ambulates with the help of a cane, extensive PMH including CAD status post PCI, cardiomyopathy, chronic combined CHF, TAVR, A. fib on Eliquis currently on hold for upcoming procedure, PPM/AICD, follows with cardiologist with [facility], type II DM with peripheral neuropathy, chronic pain for which he sees pain MD, OSA on nightly CPAP, COPD/asthma not on home oxygen, HTN, GERD, HLD, s/p left transmetatarsal amputation for osteomyelitis by Dr. Sharol Given on 08/29/2019, seen in ED on 09/09/2019 for decreased urination and pain, seen by Dr. Tresa Moore, urology, diagnosed with balanitis, urinary catheter placed and supposed to have a procedure next week presented to the ED with complaints of high fevers (104 F), chills and confusion. Admitted for sepsis due to suspected UTI, balanitis and left TMA stump infection/cellulitis. Treated with IV fluids and empiric antibiotics. Will consult orthopedics and urology.  Assessment & Plan:  Principal Problem:   Sepsis ([place]) Active Problems:   Essential hypertension, benign   Venous insufficiency of both lower extremities   COPD (chronic obstructive pulmonary disease) (HCC)   CAD (coronary artery disease), native coronary artery   Paroxysmal atrial fibrillation (HCC)   Moderate aortic stenosis   Acute lower UTI   Sepsis due to complicated UTI, balanitis and suspected left transmetatarsal amputation stump cellulitis: Recent urine culture confirms Enterococcus sensitive to vancomycin and ampicillin. Patient is allergic to vancomycin and hence he received a dose of daptomycin x1 in the ED and Zosyn. Cautiously bolused with IV fluids given history of CHF.  Soft blood pressures have improved. Lactate normal. Patient has indwelling urinary catheter placed 48 hours prior to this admission.  As per communication with pharmacy and infectious disease MD on call, changed antibiotics to linezolid instead of daptomycin, continue Zosyn.  Addendum: I just received a call from the pharmacy stating that 1 in 4 blood cultures positive for gram-positive cocci in chains.  Patient already on linezolid which will cover.  Acute urinary retention/Enterococcus complicated UTI/balanitis/phimosis: Patient and spouse were informed by urologist on call last night that "wrong medicines" were sent for him during ED visit. Spouse showed bottles of fluconazole and nystatin. Now on linezolid for Enterococcus UTI.  Urology consultation appreciated.  Communicated with MD multiple times.  She advised holding off on anticoagulation/IV heparin drip due to blood clots in Foley catheter and concern for bleeding.  Thereby IV heparin not started.  Left TMA stump cellulitis: Underwent TMA by Dr. Sharol Given on 08/29/2019. Was supposed to follow-up with him today but is hospitalized.  Dr. Farley Ly input appreciated.  Plans to order a strong shrinker.  No abscess and x-ray without osteomyelitis.  Chronic combined systolic and diastolic CHF/PPM/AICD/TAVR: Follows with Dr. Radford Pax, cardiology at [facility]. LVEF in 2019 40-45%. S/p PPM and AICD. Holding [place], Imdur and beta-blockers due to initial hypotension. Consider resuming beta-blockers when BP allows. Will discuss with urology regarding need for preop cardiac clearance.  Essential hypertension: Antihypertensives on hold due to initial hypotension.  Paroxysmal atrial fibrillation: Currently in sinus rhythm. Holding beta-blockers due to hypotension. Apixaban on hold since 09/09/2019 for upcoming urology procedure next week. Will discuss with urology. Start IV heparin infusion in the interim.  Anemia:  Suspect chronic disease.  Stable.  Thrombocytopenia: Due to acute infection. Follow CBCs daily.  Type II DM with peripheral neuropathy: Continue SSI and adjust insulins as needed. Continue gabapentin for significant neuropathy pain.  CAD s/p PCI: No anginal symptoms. Aspirin on hold due to some hematuria.  Body mass index is 45.03 kg/m. Morbid obesity  Mild transaminitis: Unclear etiology. Follow CMP in a.m.   DVT prophylaxis: SCDs.  Code Status: Full Family Communication: Discussed in detail with patient spouse at bedside. Disposition:  . Patient came from: Home.           . Anticipated d/c place: Home . Barriers to d/c: Sepsis   Consultants:   Urology Orthopedics  Procedures:   Has indwelling Foley catheter  Antimicrobials:   Daptomycin Zosyn   Subjective:  Significant peripheral neuropathy pain. Also some pain in his penis area. No chest pain or dyspnea. Patient and spouse received the first dose of Pfizer COVID-19 vaccine on 09/03/2019. Fevers better. Wants to eat something.  Objective:   Vitals:   09/12/19 1600 09/12/19 1630 09/12/19 1648 09/12/19 1700  BP: 114/60 (!) 120/57  (!) 105/45  Pulse: 85 81    Resp: 18 17  15   Temp:   98.8 F (37.1 C)   TempSrc:   Oral   SpO2: 94% 92%    Weight:      Height:        General exam: Elderly male, moderately built and morbidly obese lying uncomfortably supine in bed due to pain. No respiratory distress. Respiratory system: Clear to auscultation. Respiratory effort normal. Cardiovascular system: S1 & S2 heard, RRR. No JVD, murmurs, rubs, gallops or clicks. Trace bilateral ankle edema: Telemetry personally reviewed: Sinus rhythm with BBB morphology. V paced rhythm. Gastrointestinal system: Abdomen is nondistended/obese, soft and nontender. No organomegaly or masses felt. Normal bowel sounds heard. GU: Hesitant to allow exam due to pain. Mild erythema around his scrotal sac and penis which is difficult to visualize, buried in his scrotum, has  Foley catheter with minimal purulent drainage at tip of penis Central nervous system: Alert and oriented. No focal neurological deficits. Extremities: Symmetric 5 x 5 power. Trace bilateral leg edema. Chronic scarring and hyperpigmentation of bilateral lower legs. Left TMA site with minimal erythema and bloody serosanguineous drainage without purulent drainage or fluctuation or tenderness. Skin: No rashes, lesions or ulcers Psychiatry: Judgement and insight appear normal. Mood & affect appropriate.     Data Reviewed:   I have personally reviewed following labs and imaging studies   CBC: Recent Labs  Lab 09/09/19 1102 09/11/19 2343 09/12/19 0454  WBC 9.4 13.9* 12.0*  NEUTROABS  --  11.6* 10.0*  HGB 12.4* 10.8* 10.1*  HCT 38.9* 33.9* 31.3*  MCV 94.0 95.8 95.1  PLT 155 127* 117*    Basic Metabolic Panel: Recent Labs  Lab 09/09/19 1102 09/11/19 2343 09/12/19 0454  NA 139 138 138  K 4.4 4.2 3.7  CL 107 102 103  CO2 22 27 24   GLUCOSE 158* 146* 141*  BUN 15 19 19   CREATININE 1.07 1.08 1.02  CALCIUM 8.9 8.5* 8.2*    Liver Function Tests: Recent Labs  Lab 09/11/19 2343 09/12/19 0454  AST 127* 110*  ALT 101* 90*  ALKPHOS 133* 120  BILITOT 1.0 1.0  PROT 6.3* 5.8*  ALBUMIN 3.0* 2.8*    CBG: Recent Labs  Lab 09/12/19 0844 09/12/19 1210 09/12/19 1719  GLUCAP 102* 122* 113*    Microbiology Studies:   Recent Results (from  the past 240 hour(s))  Urine culture     Status: Abnormal   Collection Time: 09/09/19  5:32 PM   Specimen: Urine, Clean Catch  Result Value Ref Range Status   Specimen Description URINE, CLEAN CATCH  Final   Special Requests   Final    NONE Performed at [facility], [address] [place], [place] [address]    Culture (A)  Final    >=100,000 COLONIES/mL ENTEROCOCCUS FAECALIS >=100,000 COLONIES/mL AEROCOCCUS URINAE    Report Status 09/12/2019 FINAL  Final   Organism ID, Bacteria ENTEROCOCCUS FAECALIS (A)  Final       Susceptibility   Enterococcus faecalis - MIC*    AMPICILLIN <=2 SENSITIVE Sensitive     NITROFURANTOIN <=16 SENSITIVE Sensitive     VANCOMYCIN 1 SENSITIVE Sensitive     * >=100,000 COLONIES/mL ENTEROCOCCUS FAECALIS  Blood Culture (routine x 2)     Status: None (Preliminary result)   Collection Time: 09/11/19 11:38 PM   Specimen: BLOOD  Result Value Ref Range Status   Specimen Description   Final    BLOOD RIGHT ANTECUBITAL Performed at [facility] [address], [place], [place] [address]    Special Requests   Final    BOTTLES DRAWN AEROBIC AND ANAEROBIC Blood Culture adequate volume Performed at [facility] [address] [place], [place] [address]    Culture  Setup Time   Final    IN BOTH AEROBIC AND ANAEROBIC BOTTLES GRAM POSITIVE COCCI IN CHAINS CRITICAL RESULT CALLED TO, READ BACK BY AND VERIFIED WITHGuadlupe [facility] 09/12/19 1712 JDW Performed at [facility], [address], [place], [place] [address]    Culture PENDING  Incomplete   Report Status PENDING  Incomplete  Blood Culture (routine x 2)     Status: None (Preliminary result)   Collection Time: 09/11/19 11:43 PM   Specimen: BLOOD RIGHT FOREARM  Result Value Ref Range Status   Specimen Description   Final    BLOOD RIGHT FOREARM Performed at [facility], [place] [address] [place], [place] [address]    Special Requests   Final    BOTTLES DRAWN AEROBIC ONLY Blood Culture results may not be optimal due to an inadequate volume of blood received in culture bottles Performed at [facility] [address] [place], [place] [address]    Culture PENDING  Incomplete   Report Status PENDING  Incomplete  SARS CORONAVIRUS 2 (TAT 6-24 HRS) Nasopharyngeal Nasopharyngeal Swab     Status: None   Collection Time: 09/12/19  1:44 AM   Specimen: Nasopharyngeal Swab  Result Value Ref Range Status   SARS Coronavirus 2 NEGATIVE NEGATIVE Final    Comment:  (NOTE) SARS-CoV-2 target nucleic acids are NOT DETECTED. The SARS-CoV-2 RNA is generally detectable in upper and lower respiratory specimens during the acute phase of infection. Negative results do not preclude SARS-CoV-2 infection, do not rule out co-infections with other pathogens, and should not be used as the sole basis for treatment or other patient management decisions. Negative results must be combined with clinical observations, patient history, and epidemiological information. The expected result is Negative. Fact Sheet for Patients: SugarRoll.be Fact Sheet for Healthcare Providers: https://www.woods-mathews.com/ This test is not yet approved or cleared by the [place] FDA and  has been authorized for detection and/or diagnosis of SARS-CoV-2 by FDA under an Emergency Use Authorization (EUA). This EUA will remain  in effect (meaning this test  can be used) for the duration of the COVID-19 declaration under Section 56 4(b)(1) of the Act, 21 U.S.C. section 360bbb-3(b)(1), unless the authorization is terminated or revoked sooner. Performed at [facility], [place] [address] [place], [place] [address]      Radiology Studies:  US RENAL  Result Date: 2019/09/16 CLINICAL DATA:  79 year old male with gross hematuria. EXAM: RENAL / URINARY TRACT ULTRASOUND COMPLETE COMPARISON:  09/09/2019 CT FINDINGS: Right Kidney: Renal measurements: 10.6 x 5.6 x 5.7 cm = volume: 177 mL . Echogenicity within normal limits. No mass or hydronephrosis visualized. Left Kidney: Renal measurements: 13.8 x 5.1 x 5.5 cm = volume: 202 mL. Echogenicity within normal limits. No mass or hydronephrosis visualized. Bladder: No gross bladder abnormalities are noted. Bladder volume is 566 cc. A Foley catheter tip is not identified within the bladder. Other: None. IMPRESSION: 1. Unremarkable kidneys. 2. Foley catheter tip is not identified within the bladder and tip may  lie within the urethra. Correlate clinically. No gross bladder abnormality. Electronically Signed   By: Margarette Canada M.D.   On: 09/16/2019 13:41   DG Chest Port 1 View  Result Date: 09/16/19 CLINICAL DATA:  Fever. EXAM: PORTABLE CHEST 1 VIEW COMPARISON:  December 23, 2012 FINDINGS: The heart size is enlarged. 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 There is no pneumothorax. There is prominence of the perihilar regions bilaterally which is nonspecific. There is no focal infiltrate. IMPRESSION: 1. No definite acute cardiopulmonary process. 2. Prominent hila bilaterally. This is nonspecific but may be secondary to pulmonary artery hypertension. An outpatient two-view chest x-ray is recommended for further evaluation. Electronically Signed   By: Constance Holster M.D.   On: 09-16-19 00:26   DG Foot Complete Left  Result Date: September 16, 2019 CLINICAL DATA:  Infection. EXAM: LEFT FOOT - COMPLETE 3+ VIEW COMPARISON:  08/26/2019 FINDINGS: The patient is status post prior transmetatarsal amputation of the left foot. There is some surrounding soft tissue swelling. There is no radiographic evidence for osteomyelitis. There are no pockets of subcutaneous gas. There is a stable foreign body projecting within the plantar soft tissues at the level of the midfoot. There is a moderate-sized plantar calcaneal spur. IMPRESSION: 1. Postsurgical changes as detailed above without evidence for osteomyelitis. 2. Nonspecific soft tissue swelling is noted about the foot. 3. Stable foreign body in the plantar soft tissues as detailed above. Electronically Signed   By: Constance Holster M.D.   On: 09-16-19 00:27     Scheduled Meds:   . allopurinol  300 mg Oral Daily  . cyclobenzaprine  5 mg Oral QHS  . ezetimibe  10 mg Oral Daily  . finasteride  5 mg Oral Daily  . gabapentin  800 mg Oral QID  . insulin aspart  0-9 Units Subcutaneous TID WC  . niacin  1,000 mg Oral BID  .  pantoprazole  40 mg Oral Daily  . rosuvastatin  40 mg Oral Daily    Continuous Infusions:   . linezolid (ZYVOX) IV    . piperacillin-tazobactam (ZOSYN)  IV 3.375 g (Sep 16, 2019 1338)     LOS: 0 days     Vernell Leep, MD, [place], [facility]. [facility]    To contact the attending provider between 7A-7P or the covering provider during after hours 7P-7A, please log into the web site [email or web address] and access using universal [place] password for that web site. If you do not have the password, please call the  hospital operator.  09/12/2019, 5:39 PM

## 2019-09-12 NOTE — ED Notes (Signed)
Pt provided with meal tray. Pt requesting not to eat at this time. Pts wife provided with [place] sandwich, graham crackers, applesauce and diet coke.

## 2019-09-12 NOTE — ED Notes (Signed)
MD aware of pts pressures being low.

## 2019-09-12 NOTE — Consult Note (Signed)
I have been asked to see the patient by Dr. Vernell Leep, for evaluation and management of foley catheter and hematuria.  HPI: 79 yo man with morbid obesity and buried penis with recent ER encounter for severe phimosis/fungal balanitis which required foley placement over wire on 09/09/19 by Dr. Tresa Moore.  He now returns to ED with fever to 104 last night at home and hematuria.  Patient has an Corporate investment banker tip foley in place.  He has a large prostate by CT volume 100g.     Review of systems: A 12 point comprehensive review of systems was obtained and is negative unless otherwise stated in the history of present illness.  Patient Active Problem List   Diagnosis Date Noted  . SIRS (systemic inflammatory response syndrome) ([place]) 09/12/2019  . Sepsis ([place]) 09/12/2019  . Acute lower UTI 09/12/2019  . Cellulitis of foot   . Subacute osteomyelitis, left ankle and foot ([place])   . 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 Diabetic neuropathy, painful ([place]) 12/26/2016  . Neuropathic pain of foot, left 06/20/2016  . 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 Status post amputation of left great toe ([place]) 06/08/2015  . Spinal stenosis of lumbar region 02/16/2015  . Hereditary and idiopathic peripheral neuropathy 01/12/2015  . Oral thrush 12/28/2014  . Postural dizziness 05/24/2014  . Paresthesias with subjective weakness 01/30/2014  . Bilateral thigh pain 01/30/2014  . Abdominal wall pain in right upper quadrant 01/22/2014  . Gross hematuria 09/25/2013  . Intertrigo 09/25/2013  . IBS (irritable bowel syndrome) 09/11/2013  . Chronic pain syndrome 08/06/2013  . CAD (coronary artery disease), native coronary artery 12/25/2012  . Paroxysmal atrial fibrillation ([place]) 12/25/2012  . Moderate aortic stenosis 12/25/2012  . Aortic stenosis 12/24/2012  . Personal history of colonic adenoma 10/30/2012  . Diverticulosis of colon (without mention of  hemorrhage) 10/30/2012  . Internal hemorrhoids 10/30/2012  . Change in bowel habits intermittent loose stools 10/30/2012  . Routine health maintenance 06/09/2012  . Hereditary peripheral neuropathy 10/10/2011  . Long term current use of anticoagulant - apixiban 08/16/2010  . Dyspnea on exertion 09/08/2009  . HYPERCHOLESTEROLEMIA-PURE 01/13/2009  . Essential hypertension, benign 01/13/2009  . EDEMA 01/13/2009  . GOUT 01/12/2009  . GLAUCOMA 01/12/2009  . Venous (peripheral) insufficiency 01/12/2009  . COPD (chronic obstructive pulmonary disease) ([place]) 01/12/2009  . VENTRAL HERNIA 01/12/2009  . OSTEOARTHRITIS 01/12/2009  . ARTHRITIS 01/12/2009  . DEGENERATIVE DISC DISEASE 01/12/2009  . CATARACT, HX OF 01/12/2009  . HEART MURMUR, HX OF 01/12/2009  . BENIGN PROSTATIC HYPERTROPHY, HX OF 01/12/2009  . Obesity 01/17/2007  . OBSTRUCTIVE SLEEP APNEA 01/17/2007  . ASTHMA 01/17/2007  . GERD 01/17/2007  . HALLUX VALGUS, ACQUIRED 01/17/2007  . PULMONARY HYPERTENSION, HX OF 01/17/2007    No current facility-administered medications on file prior to encounter.   Current Outpatient Medications on File Prior to Encounter  Medication Sig Dispense Refill  . albuterol (PROVENTIL HFA;VENTOLIN HFA) 108 (90 BASE) MCG/ACT inhaler Inhale 2 puffs into the lungs 4 (four) times daily as needed for wheezing or shortness of breath.    . allopurinol (ZYLOPRIM) 300 MG tablet TAKE 1 TABLET BY MOUTH ONCE DAILY WITH FOOD (Patient taking differently: Take 300 mg by mouth daily. ) 90 tablet 1  . apixaban (ELIQUIS) 5 MG TABS tablet Take 1 tablet (5 mg total) by mouth 2 (two) times daily. 60 tablet 0  . ASPIRIN LOW DOSE 81 MG EC tablet Take 81 mg  by mouth daily.    . B Complex-C (B-COMPLEX WITH VITAMIN C) tablet Take 1 tablet by mouth daily. Take 1 tablet daily, 230m    . cyclobenzaprine (FLEXERIL) 5 MG tablet Take 0.5 tablet to 1 tablet at bedtime  for muscle spasm. (Patient taking differently: Take 5 mg by mouth at  bedtime. Take 0.5 tablet to 1 tablet at bedtime  for muscle spasm.) 30 tablet 1  . ezetimibe (ZETIA) 10 MG tablet take 1 tablet by mouth once daily (Patient taking differently: Take 10 mg by mouth daily. ) 90 tablet 3  . finasteride (PROSCAR) 5 MG tablet TAKE 1 TABLET BY MOUTH EVERY DAY (Patient taking differently: Take 5 mg by mouth daily. ) 90 tablet 0  . fluconazole (DIFLUCAN) 200 MG tablet Take 1 tablet (200 mg total) by mouth daily for 7 days. 7 tablet 0  . fluticasone (FLONASE) 50 MCG/ACT nasal spray Place 2 sprays into both nostrils as needed for allergies or rhinitis.    .Marland Kitchengabapentin (NEURONTIN) 800 MG tablet TAKE 1 TABLET (800 MG TOTAL) BY MOUTH 4 (FOUR) TIMES DAILY. 360 tablet 2  . isosorbide mononitrate (IMDUR) 60 MG 24 hr tablet TAKE 1 TABLET BY MOUTH EVERY DAY (Patient taking differently: Take 60 mg by mouth daily. ) 90 tablet 3  . metoprolol succinate (TOPROL-XL) 25 MG 24 hr tablet Take 25 mg by mouth daily. Along w/ 52mto equal 7571maily    . metoprolol succinate (TOPROL-XL) 50 MG 24 hr tablet Take 50 mg by mouth daily. Take along with the 26m17mblet to make total dose of 75mg36m. Multiple Vitamin (MULTIVITAMIN) capsule Take 1 capsule by mouth daily.    . niacin (NIASPAN) 1000 MG CR tablet TAKE 1 TABLET BY MOUTH TWICE A DAY (Patient taking differently: Take 1,000 mg by mouth 2 (two) times daily. ) 180 tablet 1  . nitroGLYCERIN (NITROSTAT) 0.4 MG SL tablet Place 0.4 mg under the tongue every 5 (five) minutes as needed for chest pain.     . nysMarland Kitchenatin (MYCOSTATIN/NYSTOP) powder Apply 1 application topically 3 (three) times daily. 15 g 0  . omeprazole (PRILOSEC) 10 MG capsule TAKE 1 CAPSULE BY MOUTH EVERY DAY (Patient taking differently: Take 10 mg by mouth daily. ) 90 capsule 0  . rosuvastatin (CRESTOR) 40 MG tablet take 1 tablet by mouth once daily (Patient taking differently: Take 40 mg by mouth daily. ) 15 tablet 0  . sacubitril-valsartan (ENTRESTO) 24-26 MG Take 1 tablet by mouth 2  (two) times daily.    . VITMarland KitchenMIN D, CHOLECALCIFEROL, PO Take 1 tablet by mouth daily.     . cadexomer iodine (IODOSORB) 0.9 % gel Apply 1 application topically daily as needed for wound care. Apply to the affected area daily plus dry dressing (Patient not taking: Reported on 09/09/2019) 40 g 3  . clotrimazole-betamethasone (LOTRISONE) cream apply to affected area twice a day (Patient not taking: No sig reported) 45 g 5  . doxycycline (VIBRAMYCIN) 100 MG capsule Take 1 capsule (100 mg total) by mouth 2 (two) times daily. (Patient not taking: Reported on 09/09/2019) 20 capsule 0  . finasteride (PROSCAR) 5 MG tablet Take 1 tablet (5 mg total) by mouth daily. (Patient not taking: Reported on 09/09/2019) 90 tablet 0  . KLOR-CON M20 20 MEQ tablet TAKE 1 TABLET BY MOUTH EVERY DAY (Patient not taking: Reported on 09/09/2019) 90 tablet 0  . oxyCODONE-acetaminophen (PERCOCET) 5-325 MG tablet Take 1 tablet by mouth every 4 (four)  hours as needed. (Patient not taking: Reported on 09/09/2019) 30 tablet 0    Past Medical History:  Diagnosis Date  . AICD (automatic cardioverter/defibrillator) present   . ASTHMA   . Asthma    as a child  . BPH (benign prostatic hypertrophy)   . CAD (coronary artery disease)    Nonobstructive by 12/2012 cath; then 03/2016 he required BMS to RCA ([facility]).  In-stent restenosis on cath 11/01/16, baloon angioplasty successful--pt to take plavix (no ASA), + eliquis (for PAF).  [name] CATARACT, HX OF   . Chronic combined systolic and diastolic CHF (congestive heart failure) ([place]) 05/2016   Ischemic CM.  04/2018 EF 40-45%, grd I DD.  [name] Chronic pain syndrome    Lumbar DDD; chronic neuropathic pain (DM); has spinal stimulator and sees pain mgmt MD  . COPD   . Diabetes mellitus type 2 with complications (HCC)    [id number] jumped from 5.7% to 6.1% 03/2015---started metformin at that time.  DM 2 dx by fasting gluc criteria 2018.  Has chronic neuropath pain  . Elevated transaminase level 02/2016   Suspect  due to fatty liver (documented on u/s 2007). Plan repeat labs 03/2016.  [name] Essential hypertension, benign   . Fatty liver 2007   2007 u/s showed fatty liver with hepatosplenomegaly.  2019 repeat u/s->fatty liver but no cirrhosis or hepatosplenomegaly.  [name] GERD (gastroesophageal reflux disease)   . Glaucoma   . GOUT   . Hallux valgus (acquired)   . HH (hiatus hernia)   . HYPERCHOLESTEROLEMIA-PURE   . Hypogonadism male   . Lumbosacral neuritis   . Lumbosacral spondylosis    Lumbar spinal stenosis with neurogenic claudication--contributes to his chronic pain syndrome  . Normal memory function 08/2014   Neuropsychological testing ([facility]): no cognitive impairment or sign of neurodegenerative disorder.  Likely has adjustment d/o with mixed anxiety/depressed features and may benefit from low dose SNRI.    [name] Normocytic anemia 03/2016   Mild-pt needs ferritin and vit B12 level checked (as of 03/22/16)  . NSTEMI (non-ST elevated myocardial infarction) ([place]) 03/20/2016   BMS to RCA  . Obesity hypoventilation syndrome ([place])   . Obesity, unspecified   . Orthostatic hypotension   . OSA on CPAP    8 cm H2O  . OSTEOARTHRITIS   . Paroxysmal atrial fibrillation ([place]) 2003    (? chronic?) Off anticoag for a while due to falls.  Then apixaban started 12/2014.  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 Ella Bodo): neuropathic pain->diff to treat, failed nucynta, failed spinal stimul trial, oxycontin hs + tramadol + gabap as of 12/2017 f/u Dr. Letta Pate.  . Personal history of colonic adenoma 10/30/2012   Diminutive adenoma, consider repeat 2019 per GI  . Pneumonia   . Presence of cardiac defibrillator 11/07/2017  . Primary osteoarthritis of both knees    Bone on bone of medial compartments, + signif patellofemoral arth bilat.--supartz inj series started 09/12/17  . PUD (peptic ulcer disease)   . PULMONARY HYPERTENSION, HX OF   . Secondary male  hypogonadism 2017  . 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 Shortness of breath    with exertion: much improved s/p TAVR and treatment for CHF.  . Sick sinus syndrome (HCC)    PPM placed  . Thrombocytopenia ([place]) 2018   HSM on 2007 abd u/s---suspect some mild splenic sequestration chronically.  [name] Unspecified glaucoma(365.9)   .  Unspecified hereditary and idiopathic peripheral neuropathy approx age 69   bilat LE's, ? left arm, too.  Feet became progressively numb + left foot pain intermittently.  Pt may be trying a spinal stimulator (as of 05/2015)  . VENOUS INSUFFICIENCY    Being followed by Dr. Sharol Given as of 10/2016 for two R LL venous stasis ulcers/skin tears.  Healed as of 10/30/16 f/u with Dr. Sharol Given.  . VENTRAL HERNIA     Past Surgical History:  Procedure Laterality Date  . AMPUTATION Left 04/11/2013   Procedure: AMPUTATION DIGIT Left 3rd toe;  Surgeon: [name], MD;  Location: [facility];  Service: Orthopedics;  Laterality: Left;  Left 3rd toe amputation at [facility]  . AMPUTATION Left 08/29/2019   Procedure: LEFT TRANSMETATARSAL AMPUTATION;  Surgeon: [name], MD;  Location: [facility];  Service: Orthopedics;  Laterality: Left;  . CARDIAC CATHETERIZATION  1997; 03/10/16   1997 Non-obstructive disease.  03/2016 BMS to RCA, with 25% pDiag dz, o/w normal cors per cath 03/07/16.  Cath 11/01/16: in stent restenosis, successful baloon angioplasty.  [name] CARDIAC CATHETERIZATION  12/24/2012   mild < 20% LCx, prox 30% RCA; LVEF 55-65% , moderate pulmonary HTN, moderate AS  . CARDIAC DEFIBRILLATOR PLACEMENT  11/07/2017   [facility] MRI Quad CRT defibrillator  . CARDIOVASCULAR STRESS TEST  05/11/16 ([facility])   Myocardial perfusion imaging:  No ischemia; scar in apex, global hypokinesis, EF 36%.  . Carotid dopplers  03/09/2016   [facility]: no hemodynamically significant stenosis on either side.  . CHOLECYSTECTOMY    . COLONOSCOPY    . COLONOSCOPY [name]  10/30/2012   Procedure: COLONOSCOPY;  Surgeon: [name], MD;  Location: [facility];  Service: Endoscopy;  Laterality: [name];  . CORONARY ANGIOPLASTY WITH STENT PLACEMENT  03/2016; 04/2017   2017-[facility]: BMS to RCA-pt was placed on Brilinta.  04/2017: DES to RCA.  [name] EYE SURGERY Bilateral cataract  . HEMORRHOID SURGERY    . INTRAOCULAR LENS INSERTION Bilateral   . KNEE SURGERY Right   . LEFT AND RIGHT HEART CATHETERIZATION WITH CORONARY ANGIOGRAM [name] 12/24/2012   Procedure: LEFT AND RIGHT HEART CATHETERIZATION WITH CORONARY ANGIOGRAM;  Surgeon: [name], MD;  Location: [facility];  Service: Cardiovascular;  Laterality: [name];  . LEG SURGERY Bilateral    lenghtening   . PACEMAKER PLACEMENT  04/13/2016   2nd deg HB after TAVR, pt had DC MDT PPM placed.  [name] SHOULDER ARTHROSCOPY  08/30/2011   Procedure: ARTHROSCOPY SHOULDER;  Surgeon: [name], MD;  Location: [facility];  Service: Orthopedics;  Laterality: Right;  Right Shoulder Arthroscopy, Debridement, and Decompression  . SPINAL CORD STIMULATOR INSERTION [name] 09/10/2015   Procedure: LUMBAR SPINAL CORD STIMULATOR INSERTION;  Surgeon: [name], MD;  Location: [facility];  Service: Neurosurgery;  Laterality: [name];  . 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  R big toe surg due to osteoarth  . TONSILLECTOMY    . traeculectomy Left    eye  . TRANSCATHETER AORTIC VALVE REPLACEMENT, TRANSFEMORAL  04/11/2016  . TRANSESOPHAGEAL ECHOCARDIOGRAM  03/09/2016   [facility]: EF 55-60%, PFO seen with bi-directional shunting, no thrombus in appendage.  . TRANSTHORACIC ECHOCARDIOGRAM  01/2015; 01/2016; 05/18/16; 09/18/16, 05/2017, 08/2017   01/2015 No signif change in aortic stenosis (moderate).  01/2016 Severe LVH w/small LV cavity, EF 60-65%, grade I diast dysfxn.  05/2016 (s/p TAVR): EF 50-55%, grd I DD, biopros AV good.  08/2016--EF 50-55%, LV septal motion c/w conduction abnl, grd I DD,mild  MS,bioprosth aortic valve well seated, w/trace AR. 05/2017 TTE  EF 35%. 08/2017-EF 35%, mod diff hypokin LV, grd I DD, biopros AV good.   . TRANSTHORACIC ECHOCARDIOGRAM     04/2018: EF 40-45%, mod diffuse LV hypokinesis, grd I DD, bioprosth AV well seated, no AS or AR.  [name] VITRECTOMY      Social History   Tobacco Use  . Smoking status: Never Smoker  . Smokeless tobacco: Never Used  Substance Use Topics  . Alcohol use: No    Alcohol/week: 0.0 standard drinks  . Drug use: No    Types: Oxycodone    Family History  Problem Relation Age of Onset  . Hypertension Mother   . Coronary artery disease Mother   . Heart attack Mother   . Neuropathy Mother   . Pulmonary fibrosis Father        asbestosis    PE: Vitals:   09/12/19 1000 09/12/19 1030 09/12/19 1100 09/12/19 1130  BP: (!) 104/54 (!) 116/96 (!) 108/47 (!) 108/58  Pulse: 91 (!) 56 95 (!) 102  Resp: 19 (!) 22 17 13   Temp:      TempSrc:      SpO2: 98% 92% 94% 100%  Weight:      Height:       Patient moaning in bed, morbidly obese Atraumatic normocephalic head No increased work of breathing Regular sinus rhythm/rate Abdomen is soft, nontender, nondistended GU buried penis, thick white discharge around foley, 18Fr council tip in place with clot at junction of foley and tubing, mild erythema of scrotal skin but no palpable fluctuance or induration, testis soft/descended b/l Grossly neurologically intact LE partial amputation with incision c/d/i   Recent Labs    09/11/19 2343 09/12/19 0454  WBC 13.9* 12.0*  HGB 10.8* 10.1*  HCT 33.9* 31.3*   Recent Labs    09/11/19 2343 09/12/19 0454  NA 138 138  K 4.2 3.7  CL 102 103  CO2 27 24  GLUCOSE 146* 141*  BUN 19 19  CREATININE 1.08 1.02  CALCIUM 8.5* 8.2*   Recent Labs    09/11/19 2343  INR 1.2    Imaging: CT Abd/Pelvis 09/09/19 CLINICAL DATA:  Dysuria, acute generalized abdominal pain.  EXAM: CT ABDOMEN AND PELVIS WITH CONTRAST  TECHNIQUE: Multidetector CT imaging of the abdomen and pelvis was performed using  the standard protocol following bolus administration of intravenous contrast.  CONTRAST:  165m OMNIPAQUE IOHEXOL 300 MG/ML  SOLN  COMPARISON:  None.  FINDINGS: Lower chest: No acute abnormality.  Hepatobiliary: No focal liver abnormality is seen. Status post cholecystectomy. No biliary dilatation.  Pancreas: Unremarkable. No pancreatic ductal dilatation or surrounding inflammatory changes.  Spleen: Normal in size without focal abnormality.  Adrenals/Urinary Tract: Adrenal glands are unremarkable. Kidneys are normal, without renal calculi, focal lesion, or hydronephrosis. Bladder is unremarkable.  Stomach/Bowel: Stomach is within normal limits. Appendix appears normal. No evidence of bowel wall thickening, distention, or inflammatory changes. Sigmoid diverticulosis is noted without inflammation.  Vascular/Lymphatic: Aortic atherosclerosis. No enlarged abdominal or pelvic lymph nodes.  Reproductive: Prostate is unremarkable.  Other: No abdominal wall hernia or abnormality. No abdominopelvic ascites.  Musculoskeletal: No acute or significant osseous findings.  IMPRESSION: 1. Sigmoid diverticulosis without inflammation. 2. Aortic atherosclerosis. 3. No acute abnormality seen in the abdomen or pelvis  A/P: 79yo man with buried penis resulting in phimosis and balanitis also with urinary retention likely secondary to BPH currently managed by 18Fr council tip foley.    -18Fr council  tip foley was irrigated and return of clear yellow urine was seen; however, it was poorly tolerated by patient due to bladder spasms -oxybutynin 72m PO TID PRN bladder cramping  -recommend renal bladder [place] evaluate for clot burden in bladder -hold anticoaguation for now as patient has clots in tubing and he is not tolerated irrigation very well   Thank you for involving me in this patient's care, I will continue to follow along. Please page with any further questions or  concerns. Shellsea Borunda D Miaisabella Bacorn

## 2019-09-12 NOTE — ED Notes (Signed)
Pts wife called out stating the pt was choking. Upon going into the room, pt was alert and oriented, in NAD. Pts o2 sat on 2L was 98% at the time. When asked about event pt states I dont know what happen I feel fine now. Pts wife states " I think he was sleeping and got choked because he lying flat" Pts HOB elevated. Pts linen changed, pericare performed, gown changed. Pt repositioned in bed.

## 2019-09-12 NOTE — ED Notes (Signed)
Called respiratory at this time for CPAP.

## 2019-09-12 NOTE — ED Notes (Signed)
Hal Hope, MD made aware of patients blood pressure.

## 2019-09-12 NOTE — ED Notes (Signed)
Pt placed on 2L o2 via [place]. RA sat dropping to 88%.

## 2019-09-12 NOTE — ED Notes (Signed)
Pt provided with meal tray at this time.

## 2019-09-12 NOTE — ED Provider Notes (Signed)
[facility] DEPT Provider Note   CSN: [id number] Arrival date & time: 09/11/19  2317     History Chief Complaint  Patient presents with  . Code Sepsis    Tim [name] is a 79 y.o. male.  Patient brought to the emergency department by EMS under unclear circumstances.  EMS report that they were called to the house because he had accidentally been taking fluconazole instead of an antibiotic for the last 2 days.  His primary doctor apparently told him to go to the ER.  Upon arrival, patient is mildly confused but this is apparently his baseline.  His only complaint is intermittent sharp stabbing pains in his feet from "neuropathy".        Past Medical History:  Diagnosis Date  . AICD (automatic cardioverter/defibrillator) present   . ASTHMA   . Asthma    as a child  . BPH (benign prostatic hypertrophy)   . CAD (coronary artery disease)    Nonobstructive by 12/2012 cath; then 03/2016 he required BMS to RCA ([facility]).  In-stent restenosis on cath 11/01/16, baloon angioplasty successful--pt to take plavix (no ASA), + eliquis (for PAF).  [name] CATARACT, HX OF   . Chronic combined systolic and diastolic CHF (congestive heart failure) ([place]) 05/2016   Ischemic CM.  04/2018 EF 40-45%, grd I DD.  [name] Chronic pain syndrome    Lumbar DDD; chronic neuropathic pain (DM); has spinal stimulator and sees pain mgmt MD  . COPD   . Diabetes mellitus type 2 with complications (HCC)    KKX3G jumped from 5.7% to 6.1% 03/2015---started metformin at that time.  DM 2 dx by fasting gluc criteria 2018.  Has chronic neuropath pain  . Elevated transaminase level 02/2016   Suspect due to fatty liver (documented on u/s 2007). Plan repeat labs 03/2016.  [name] Essential hypertension, benign   . Fatty liver 2007   2007 u/s showed fatty liver with hepatosplenomegaly.  2019 repeat u/s->fatty liver but no cirrhosis or hepatosplenomegaly.  [name] GERD (gastroesophageal reflux disease)   . Glaucoma    . GOUT   . Hallux valgus (acquired)   . HH (hiatus hernia)   . HYPERCHOLESTEROLEMIA-PURE   . Hypogonadism male   . Lumbosacral neuritis   . Lumbosacral spondylosis    Lumbar spinal stenosis with neurogenic claudication--contributes to his chronic pain syndrome  . Normal memory function 08/2014   Neuropsychological testing ([facility]): no cognitive impairment or sign of neurodegenerative disorder.  Likely has adjustment d/o with mixed anxiety/depressed features and may benefit from low dose SNRI.    [name] Normocytic anemia 03/2016   Mild-pt needs ferritin and vit B12 level checked (as of 03/22/16)  . NSTEMI (non-ST elevated myocardial infarction) ([place]) 03/20/2016   BMS to RCA  . Obesity hypoventilation syndrome ([place])   . Obesity, unspecified   . Orthostatic hypotension   . OSA on CPAP    8 cm H2O  . OSTEOARTHRITIS   . Paroxysmal atrial fibrillation ([place]) 2003    (? chronic?) Off anticoag for a while due to falls.  Then apixaban started 12/2014.  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 Ella Bodo): neuropathic pain->diff to treat, failed nucynta, failed spinal stimul trial, oxycontin hs + tramadol + gabap as of 12/2017 f/u Dr. Letta Pate.  . Personal history of colonic adenoma 10/30/2012   Diminutive adenoma, consider repeat 2019 per GI  . Pneumonia   . Presence of cardiac defibrillator 11/07/2017  . Primary osteoarthritis of  both knees    Bone on bone of medial compartments, + signif patellofemoral arth bilat.--supartz inj series started 09/12/17  . PUD (peptic ulcer disease)   . PULMONARY HYPERTENSION, HX OF   . Secondary male hypogonadism 2017  . 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 Shortness of breath    with exertion: much improved s/p TAVR and treatment for CHF.  . Sick sinus syndrome (HCC)    PPM placed  . Thrombocytopenia ([place]) 2018   HSM on 2007 abd  u/s---suspect some mild splenic sequestration chronically.  [name] Unspecified glaucoma(365.9)   . Unspecified hereditary and idiopathic peripheral neuropathy approx age 60   bilat LE's, ? left arm, too.  Feet became progressively numb + left foot pain intermittently.  Pt may be trying a spinal stimulator (as of 05/2015)  . VENOUS INSUFFICIENCY    Being followed by Dr. Sharol Given as of 10/2016 for two R LL venous stasis ulcers/skin tears.  Healed as of 10/30/16 f/u with Dr. Sharol Given.  . VENTRAL HERNIA     Patient Active Problem List   Diagnosis Date Noted  . Subacute osteomyelitis, left ankle and foot ([place])   . 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 Diabetic neuropathy, painful ([place]) 12/26/2016  . Neuropathic pain of foot, left 06/20/2016  . 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 Status post amputation of left great toe ([place]) 06/08/2015  . Spinal stenosis of lumbar region 02/16/2015  . Hereditary and idiopathic peripheral neuropathy 01/12/2015  . Oral thrush 12/28/2014  . Postural dizziness 05/24/2014  . Paresthesias with subjective weakness 01/30/2014  . Bilateral thigh pain 01/30/2014  . Abdominal wall pain in right upper quadrant 01/22/2014  . Gross hematuria 09/25/2013  . Intertrigo 09/25/2013  . IBS (irritable bowel syndrome) 09/11/2013  . Chronic pain syndrome 08/06/2013  . CAD (coronary artery disease), native coronary artery 12/25/2012  . Paroxysmal atrial fibrillation ([place]) 12/25/2012  . Moderate aortic stenosis 12/25/2012  . Aortic stenosis 12/24/2012  . Personal history of colonic adenoma 10/30/2012  . Diverticulosis of colon (without mention of hemorrhage) 10/30/2012  . Internal hemorrhoids 10/30/2012  . Change in bowel habits intermittent loose stools 10/30/2012  . Routine health maintenance 06/09/2012  . Hereditary peripheral neuropathy 10/10/2011  . Long term current use of anticoagulant - apixiban 08/16/2010  .  Dyspnea on exertion 09/08/2009  . HYPERCHOLESTEROLEMIA-PURE 01/13/2009  . Essential hypertension, benign 01/13/2009  . EDEMA 01/13/2009  . GOUT 01/12/2009  . GLAUCOMA 01/12/2009  . Venous (peripheral) insufficiency 01/12/2009  . COPD (chronic obstructive pulmonary disease) ([place]) 01/12/2009  . VENTRAL HERNIA 01/12/2009  . OSTEOARTHRITIS 01/12/2009  . ARTHRITIS 01/12/2009  . DEGENERATIVE DISC DISEASE 01/12/2009  . CATARACT, HX OF 01/12/2009  . HEART MURMUR, HX OF 01/12/2009  . BENIGN PROSTATIC HYPERTROPHY, HX OF 01/12/2009  . Obesity 01/17/2007  . OBSTRUCTIVE SLEEP APNEA 01/17/2007  . ASTHMA 01/17/2007  . GERD 01/17/2007  . HALLUX VALGUS, ACQUIRED 01/17/2007  . PULMONARY HYPERTENSION, HX OF 01/17/2007    Past Surgical History:  Procedure Laterality Date  . AMPUTATION Left 04/11/2013   Procedure: AMPUTATION DIGIT Left 3rd toe;  Surgeon: [name], MD;  Location: [facility];  Service: Orthopedics;  Laterality: Left;  Left 3rd toe amputation at [facility]  . AMPUTATION Left 08/29/2019   Procedure: LEFT TRANSMETATARSAL AMPUTATION;  Surgeon: [name], MD;  Location: [facility];  Service: Orthopedics;  Laterality: Left;  . CARDIAC CATHETERIZATION  1997; 03/10/16   1997 Non-obstructive disease.  03/2016 BMS to RCA, with 25% pDiag dz, o/w normal cors per cath 03/07/16.  Cath 11/01/16: in stent restenosis, successful baloon angioplasty.  [name] CARDIAC CATHETERIZATION  12/24/2012   mild < 20% LCx, prox 30% RCA; LVEF 55-65% , moderate pulmonary HTN, moderate AS  . CARDIAC DEFIBRILLATOR PLACEMENT  11/07/2017   [facility] MRI Quad CRT defibrillator  . CARDIOVASCULAR STRESS TEST  05/11/16 ([facility])   Myocardial perfusion imaging:  No ischemia; scar in apex, global hypokinesis, EF 36%.  . Carotid dopplers  03/09/2016   [facility]: no hemodynamically significant stenosis on either side.  . CHOLECYSTECTOMY    . COLONOSCOPY    . COLONOSCOPY [name] 10/30/2012   Procedure: COLONOSCOPY;  Surgeon: [name], MD;  Location: [facility]  ENDOSCOPY;  Service: Endoscopy;  Laterality: [name];  . CORONARY ANGIOPLASTY WITH STENT PLACEMENT  03/2016; 04/2017   2017-[facility]: BMS to RCA-pt was placed on Brilinta.  04/2017: DES to RCA.  [name] EYE SURGERY Bilateral cataract  . HEMORRHOID SURGERY    . INTRAOCULAR LENS INSERTION Bilateral   . KNEE SURGERY Right   . LEFT AND RIGHT HEART CATHETERIZATION WITH CORONARY ANGIOGRAM [name] 12/24/2012   Procedure: LEFT AND RIGHT HEART CATHETERIZATION WITH CORONARY ANGIOGRAM;  Surgeon: [name], MD;  Location[facility] CATH LAB;  Service: Cardiovascular;  Laterality: [name];  . LEG SURGERY Bilateral    lenghtening   . PACEMAKER PLACEMENT  04/13/2016   2nd deg HB after TAVR, pt had DC MDT PPM placed.  [name] SHOULDER ARTHROSCOPY  08/30/2011   Procedure: ARTHROSCOPY SHOULDER;  Surgeon: [name], MD;  Location: [facility];  Service: Orthopedics;  Laterality: Right;  Right Shoulder Arthroscopy, Debridement, and Decompression  . SPINAL CORD STIMULATOR INSERTION [name] 09/10/2015   Procedure: LUMBAR SPINAL CORD STIMULATOR INSERTION;  Surgeon: [name], MD;  Location: [facility] NEURO ORS;  Service: Neurosurgery;  Laterality: [name];  . 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  R big toe surg due to osteoarth  . TONSILLECTOMY    . traeculectomy Left    eye  . TRANSCATHETER AORTIC VALVE REPLACEMENT, TRANSFEMORAL  04/11/2016  . TRANSESOPHAGEAL ECHOCARDIOGRAM  03/09/2016   [facility]: EF 55-60%, PFO seen with bi-directional shunting, no thrombus in appendage.  . TRANSTHORACIC ECHOCARDIOGRAM  01/2015; 01/2016; 05/18/16; 09/18/16, 05/2017, 08/2017   01/2015 No signif change in aortic stenosis (moderate).  01/2016 Severe LVH w/small LV cavity, EF 60-65%, grade I diast dysfxn.  05/2016 (s/p TAVR): EF 50-55%, grd I DD, biopros AV good.  08/2016--EF 50-55%, LV septal motion c/w conduction abnl, grd I DD,mild MS,bioprosth aortic valve well seated, w/trace AR. 05/2017 TTE EF 35%. 08/2017-EF 35%, mod diff hypokin LV, grd I DD, biopros AV good.   .  TRANSTHORACIC ECHOCARDIOGRAM     04/2018: EF 40-45%, mod diffuse LV hypokinesis, grd I DD, bioprosth AV well seated, no AS or AR.  [name] VITRECTOMY         Family History  Problem Relation Age of Onset  . Hypertension Mother   . Coronary artery disease Mother   . Heart attack Mother   . Neuropathy Mother   . Pulmonary fibrosis Father        asbestosis    Social History   Tobacco Use  . Smoking status: Never Smoker  . Smokeless tobacco: Never Used  Substance Use Topics  . Alcohol use: No    Alcohol/week: 0.0 standard drinks  . Drug  use: No    Types: Oxycodone    Home Medications Prior to Admission medications   Medication Sig Start Date End Date Taking? Authorizing Provider  albuterol (PROVENTIL HFA;VENTOLIN HFA) 108 (90 BASE) MCG/ACT inhaler Inhale 2 puffs into the lungs 4 (four) times daily as needed for wheezing or shortness of breath.   Yes [provider]  allopurinol (ZYLOPRIM) 300 MG tablet TAKE 1 TABLET BY MOUTH ONCE DAILY WITH FOOD Patient taking differently: Take 300 mg by mouth daily.  06/12/19  Yes McGowen, Adrian Blackwater, MD  apixaban (ELIQUIS) 5 MG TABS tablet Take 1 tablet (5 mg total) by mouth 2 (two) times daily. 01/02/17  Yes Lelon Perla, MD  ASPIRIN LOW DOSE 81 MG EC tablet Take 81 mg by mouth daily. 02/03/19  Yes [provider]  B Complex-C (B-COMPLEX WITH VITAMIN C) tablet Take 1 tablet by mouth daily. Take 1 tablet daily, 230m   Yes [provider]  cyclobenzaprine (FLEXERIL) 5 MG tablet Take 0.5 tablet to 1 tablet at bedtime  for muscle spasm. Patient taking differently: Take 5 mg by mouth at bedtime. Take 0.5 tablet to 1 tablet at bedtime  for muscle spasm. 10/21/18  Yes TBayard Hugger NP  ezetimibe (ZETIA) 10 MG tablet take 1 tablet by mouth once daily Patient taking differently: Take 10 mg by mouth daily.  01/18/17  Yes CLelon Perla MD  finasteride (PROSCAR) 5 MG tablet TAKE 1 TABLET BY MOUTH EVERY DAY Patient taking  differently: Take 5 mg by mouth daily.  09/08/19  Yes McGowen, PAdrian Blackwater MD  fluconazole (DIFLUCAN) 200 MG tablet Take 1 tablet (200 mg total) by mouth daily for 7 days. 09/09/19 09/16/19 Yes FJacqlyn Larsen PA-C  fluticasone (FLONASE) 50 MCG/ACT nasal spray Place 2 sprays into both nostrils as needed for allergies or rhinitis.   Yes [provider]  gabapentin (NEURONTIN) 800 MG tablet TAKE 1 TABLET (800 MG TOTAL) BY MOUTH 4 (FOUR) TIMES DAILY. 04/21/19  Yes Kirsteins, ALuanna Salk MD  isosorbide mononitrate (IMDUR) 60 MG 24 hr tablet TAKE 1 TABLET BY MOUTH EVERY DAY Patient taking differently: Take 60 mg by mouth daily.  08/28/19  Yes McGowen, PAdrian Blackwater MD  metoprolol succinate (TOPROL-XL) 25 MG 24 hr tablet Take 25 mg by mouth daily. Along w/ 532mto equal 7547maily   Yes [provider]  metoprolol succinate (TOPROL-XL) 50 MG 24 hr tablet Take 50 mg by mouth daily. Take along with the 38m5mblet to make total dose of 75mg49mes [provider]  Multiple Vitamin (MULTIVITAMIN) capsule Take 1 capsule by mouth daily.   Yes [provider]  niacin (NIASPAN) 1000 MG CR tablet TAKE 1 TABLET BY MOUTH TWICE A DAY Patient taking differently: Take 1,000 mg by mouth 2 (two) times daily.  04/21/19  Yes McGowen, PhiliAdrian Blackwater nitroGLYCERIN (NITROSTAT) 0.4 MG SL tablet Place 0.4 mg under the tongue every 5 (five) minutes as needed for chest pain.  09/04/17  Yes [provider]  nystatin (MYCOSTATIN/NYSTOP) powder Apply 1 application topically 3 (three) times daily. 09/09/19  Yes Ford,Jacqlyn LarsenC  omeprazole (PRILOSEC) 10 MG capsule TAKE 1 CAPSULE BY MOUTH EVERY DAY Patient taking differently: Take 10 mg by mouth daily.  05/05/19  Yes McGowen, PhiliAdrian Blackwater rosuvastatin (CRESTOR) 40 MG tablet take 1 tablet by mouth once daily Patient taking differently: Take 40 mg by mouth daily.  02/05/17  Yes CrensLelon Perla  MD  sacubitril-valsartan (ENTRESTO) 24-26 MG Take 1 tablet by  mouth 2 (two) times daily. 07/19/17  Yes [provider]  VITAMIN D, CHOLECALCIFEROL, PO Take 1 tablet by mouth daily.    Yes [provider]  cadexomer iodine (IODOSORB) 0.9 % gel Apply 1 application topically daily as needed for wound care. Apply to the affected area daily plus dry dressing Patient not taking: Reported on 09/09/2019 08/19/19   Persons, Bevely Palmer, PA  clotrimazole-betamethasone (LOTRISONE) cream apply to affected area twice a day Patient not taking: No sig reported 11/04/15   McGowen, Adrian Blackwater, MD  doxycycline (VIBRAMYCIN) 100 MG capsule Take 1 capsule (100 mg total) by mouth 2 (two) times daily. Patient not taking: Reported on 09/09/2019 08/14/19   Orpah Greek, MD  finasteride (PROSCAR) 5 MG tablet Take 1 tablet (5 mg total) by mouth daily. Patient not taking: Reported on 09/09/2019 09/05/19   Tammi Sou, MD  KLOR-CON M20 20 MEQ tablet TAKE 1 TABLET BY MOUTH EVERY DAY Patient not taking: Reported on 09/09/2019 09/08/19   Tammi Sou, MD  oxyCODONE-acetaminophen (PERCOCET) 5-325 MG tablet Take 1 tablet by mouth every 4 (four) hours as needed. Patient not taking: Reported on 09/09/2019 08/29/19 08/28/20  Persons, Bevely Palmer, [place]    Allergies    Brimonidine tartrate, Brimonidine tartrate, Brinzolamide, Latanoprost, Nucynta [tapentadol], Sulfa antibiotics, Timolol maleate, Cardizem  [diltiazem hcl], Diltiazem, Rofecoxib, Vancomycin, Codeine, Celecoxib, Colchicine, and Tape  Review of Systems   Review of Systems  Unable to perform ROS: Mental status change    Physical Exam Updated Vital Signs BP (!) 117/59   Pulse 98   Temp (!) 103 F (39.4 C) (Oral)   Resp (!) 21   Ht 6' (1.829 m)   Wt (!) 150.6 kg   SpO2 99%   BMI 45.03 kg/m   Physical Exam Vitals and nursing note reviewed.  Constitutional:      General: He is not in acute distress.    Appearance: Normal appearance. He is well-developed. He is obese.  HENT:     Head: Normocephalic and  atraumatic.     Right Ear: Hearing normal.     Left Ear: Hearing normal.     Nose: Nose normal.  Eyes:     Conjunctiva/sclera: Conjunctivae normal.     Pupils: Pupils are equal, round, and reactive to light.  Cardiovascular:     Rate and Rhythm: Regular rhythm. Tachycardia present.     Heart sounds: S1 normal and S2 normal. No murmur. No friction rub. No gallop.   Pulmonary:     Effort: Pulmonary effort is normal. No respiratory distress.     Breath sounds: Normal breath sounds.  Chest:     Chest wall: No tenderness.  Abdominal:     General: Bowel sounds are normal.     Palpations: Abdomen is soft.     Tenderness: There is no abdominal tenderness. There is no guarding or rebound. Negative signs include Murphy's sign and McBurney's sign.     Hernia: No hernia is present.  Genitourinary:    Comments: Foley catheter in place, grossly bloody urine seen in Foley Musculoskeletal:        General: Normal range of motion.     Cervical back: Normal range of motion and neck supple.     Comments: Recent transmetatarsal amputation left foot with dressing in place -sutures intact and no drainage  Skin:    General: Skin is warm and dry.  Findings: Erythema (And warmth around surgical incision left foot) present. No rash.  Neurological:     Mental Status: He is alert.     GCS: GCS eye subscore is 4. GCS verbal subscore is 5. GCS motor subscore is 6.     Cranial Nerves: No cranial nerve deficit.     Sensory: No sensory deficit.     Coordination: Coordination normal.  Psychiatric:        Speech: Speech normal.        Behavior: Behavior normal.        Thought Content: Thought content normal.       ED Results / Procedures / Treatments   Labs (all labs ordered are listed, but only abnormal results are displayed) Labs Reviewed  CBC WITH DIFFERENTIAL/PLATELET - Abnormal; Notable for the following components:      Result Value   WBC 13.9 (*)    RBC 3.54 (*)    Hemoglobin 10.8 (*)     HCT 33.9 (*)    Platelets 127 (*)    Neutro Abs 11.6 (*)    Monocytes Absolute 1.5 (*)    All other components within normal limits  PROTIME-INR - Abnormal; Notable for the following components:   Prothrombin Time 15.3 (*)    All other components within normal limits  URINALYSIS, ROUTINE W REFLEX MICROSCOPIC - Abnormal; Notable for the following components:   Color, Urine RED (*)    APPearance TURBID (*)    Glucose, UA   (*)    Value: TEST NOT REPORTED DUE TO COLOR INTERFERENCE OF URINE PIGMENT   Hgb urine dipstick   (*)    Value: TEST NOT REPORTED DUE TO COLOR INTERFERENCE OF URINE PIGMENT   Bilirubin Urine   (*)    Value: TEST NOT REPORTED DUE TO COLOR INTERFERENCE OF URINE PIGMENT   Ketones, ur   (*)    Value: TEST NOT REPORTED DUE TO COLOR INTERFERENCE OF URINE PIGMENT   Protein, ur   (*)    Value: TEST NOT REPORTED DUE TO COLOR INTERFERENCE OF URINE PIGMENT   Nitrite   (*)    Value: TEST NOT REPORTED DUE TO COLOR INTERFERENCE OF URINE PIGMENT   Leukocytes,Ua   (*)    Value: TEST NOT REPORTED DUE TO COLOR INTERFERENCE OF URINE PIGMENT   RBC / HPF >50 (*)    WBC, UA >50 (*)    Bacteria, UA MANY (*)    All other components within normal limits  CULTURE, BLOOD (ROUTINE X 2)  CULTURE, BLOOD (ROUTINE X 2)  URINE CULTURE  APTT  LACTIC ACID, PLASMA  LACTIC ACID, PLASMA  COMPREHENSIVE METABOLIC PANEL  PROCALCITONIN    EKG None ED ECG REPORT   Date: 09/12/2019  Rate: 100  Rhythm: atrial sensed ventricular pacing   Old EKG Reviewed: unchanged  I have personally reviewed the EKG tracing and disagree with the computerized printout as noted.  Radiology DG Chest Port 1 View  Result Date: 09/12/2019 CLINICAL DATA:  Fever. EXAM: PORTABLE CHEST 1 VIEW COMPARISON:  December 23, 2012 FINDINGS: The heart size is enlarged. 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 There is no pneumothorax. There is prominence of the perihilar regions  bilaterally which is nonspecific. There is no focal infiltrate. IMPRESSION: 1. No definite acute cardiopulmonary process. 2. Prominent hila bilaterally. This is nonspecific but may be secondary to pulmonary artery hypertension. An outpatient two-view chest x-ray is recommended for further evaluation.  Electronically Signed   By: Constance Holster M.D.   On: 09/12/2019 00:26   DG Foot Complete Left  Result Date: 09/12/2019 CLINICAL DATA:  Infection. EXAM: LEFT FOOT - COMPLETE 3+ VIEW COMPARISON:  08/26/2019 FINDINGS: The patient is status post prior transmetatarsal amputation of the left foot. There is some surrounding soft tissue swelling. There is no radiographic evidence for osteomyelitis. There are no pockets of subcutaneous gas. There is a stable foreign body projecting within the plantar soft tissues at the level of the midfoot. There is a moderate-sized plantar calcaneal spur. IMPRESSION: 1. Postsurgical changes as detailed above without evidence for osteomyelitis. 2. Nonspecific soft tissue swelling is noted about the foot. 3. Stable foreign body in the plantar soft tissues as detailed above. Electronically Signed   By: Constance Holster M.D.   On: 09/12/2019 00:27    Procedures Procedures (including critical care time)  Medications Ordered in ED Medications  DAPTOmycin (CUBICIN) 750 mg in sodium chloride 0.9 % IVPB (750 mg Intravenous New Bag/Given 09/12/19 0038)  piperacillin-tazobactam (ZOSYN) IVPB 3.375 g (has no administration in time range)  piperacillin-tazobactam (ZOSYN) IVPB 3.375 g (3.375 g Intravenous New Bag/Given 09/12/19 0004)    ED Course  I have reviewed the triage vital signs and the nursing notes.  Pertinent labs & imaging results that were available during my care of the patient were reviewed by me and considered in my medical decision making (see chart for details).    MDM Rules/Calculators/A&P                      Patient presents in the ER with unclear complaints  initially.  He reportedly was told by his primary doctor to come to the ER because he had been mismanaging his medications.  Upon arrival to the ER, however, patient found to be febrile.  Reviewing his records reveals that he was diagnosed with UTI 2 days ago.  Culture has grown Enterococcus.  Additionally, his surgical incision site of the left foot has erythema without drainage.  Antibiotics per pharmacy consult (Zosyn and daptomycin) -patient with known vancomycin allergy.  CRITICAL CARE Performed by: Orpah Greek   Total critical care time: 35 minutes  Critical care time was exclusive of separately billable procedures and treating other patients.  Critical care was necessary to treat or prevent imminent or life-threatening deterioration.  Critical care was time spent personally by me on the following activities: development of treatment plan with patient and/or surrogate as well as nursing, discussions with consultants, evaluation of patient's response to treatment, examination of patient, obtaining history from patient or surrogate, ordering and performing treatments and interventions, ordering and review of laboratory studies, ordering and review of radiographic studies, pulse oximetry and re-evaluation of patient's condition.   Final Clinical Impression(s) / ED Diagnoses Final diagnoses:  Infection  Enterococcus UTI  Cellulitis of foot    Rx / DC Orders ED Discharge Orders    None       Karnisha Lefebre, Gwenyth Allegra, MD 09/12/19 0041

## 2019-09-13 ENCOUNTER — Inpatient Hospital Stay (HOSPITAL_COMMUNITY): Payer: Medicare Other

## 2019-09-13 DIAGNOSIS — Z885 Allergy status to narcotic agent status: Secondary | ICD-10-CM

## 2019-09-13 DIAGNOSIS — N390 Urinary tract infection, site not specified: Secondary | ICD-10-CM

## 2019-09-13 DIAGNOSIS — R310 Gross hematuria: Secondary | ICD-10-CM

## 2019-09-13 DIAGNOSIS — G4733 Obstructive sleep apnea (adult) (pediatric): Secondary | ICD-10-CM

## 2019-09-13 DIAGNOSIS — Z91048 Other nonmedicinal substance allergy status: Secondary | ICD-10-CM

## 2019-09-13 DIAGNOSIS — L8990 Pressure ulcer of unspecified site, unspecified stage: Secondary | ICD-10-CM | POA: Diagnosis present

## 2019-09-13 DIAGNOSIS — N471 Phimosis: Secondary | ICD-10-CM

## 2019-09-13 DIAGNOSIS — B952 Enterococcus as the cause of diseases classified elsewhere: Secondary | ICD-10-CM

## 2019-09-13 DIAGNOSIS — I480 Paroxysmal atrial fibrillation: Secondary | ICD-10-CM

## 2019-09-13 DIAGNOSIS — R7881 Bacteremia: Secondary | ICD-10-CM

## 2019-09-13 DIAGNOSIS — I10 Essential (primary) hypertension: Secondary | ICD-10-CM

## 2019-09-13 DIAGNOSIS — Z952 Presence of prosthetic heart valve: Secondary | ICD-10-CM

## 2019-09-13 DIAGNOSIS — Z89422 Acquired absence of other left toe(s): Secondary | ICD-10-CM

## 2019-09-13 DIAGNOSIS — Z95810 Presence of automatic (implantable) cardiac defibrillator: Secondary | ICD-10-CM

## 2019-09-13 DIAGNOSIS — Z888 Allergy status to other drugs, medicaments and biological substances status: Secondary | ICD-10-CM

## 2019-09-13 DIAGNOSIS — Z960 Presence of urogenital implants: Secondary | ICD-10-CM

## 2019-09-13 DIAGNOSIS — Z881 Allergy status to other antibiotic agents status: Secondary | ICD-10-CM

## 2019-09-13 DIAGNOSIS — I872 Venous insufficiency (chronic) (peripheral): Secondary | ICD-10-CM

## 2019-09-13 DIAGNOSIS — R339 Retention of urine, unspecified: Secondary | ICD-10-CM | POA: Diagnosis present

## 2019-09-13 DIAGNOSIS — Z89432 Acquired absence of left foot: Secondary | ICD-10-CM

## 2019-09-13 LAB — COMPREHENSIVE METABOLIC PANEL
ALT: 183 U/L — ABNORMAL HIGH (ref 0–44)
AST: 268 U/L — ABNORMAL HIGH (ref 15–41)
Albumin: 2.9 g/dL — ABNORMAL LOW (ref 3.5–5.0)
Alkaline Phosphatase: 181 U/L — ABNORMAL HIGH (ref 38–126)
Anion gap: 9 (ref 5–15)
BUN: 15 mg/dL (ref 8–23)
CO2: 25 mmol/L (ref 22–32)
Calcium: 8.4 mg/dL — ABNORMAL LOW (ref 8.9–10.3)
Chloride: 104 mmol/L (ref 98–111)
Creatinine, Ser: 0.8 mg/dL (ref 0.61–1.24)
GFR calc Af Amer: >60 mL/min (ref >60)
GFR calc non Af Amer: >60 mL/min (ref >60)
Glucose, Bld: 108 mg/dL — ABNORMAL HIGH (ref 70–99)
Potassium: 3.3 mmol/L — ABNORMAL LOW (ref 3.5–5.1)
Sodium: 138 mmol/L (ref 135–145)
Total Bilirubin: 1 mg/dL (ref 0.3–1.2)
Total Protein: 5.7 g/dL — ABNORMAL LOW (ref 6.5–8.1)

## 2019-09-13 LAB — CBC
HCT: 30.9 % — ABNORMAL LOW (ref 39.0–52.0)
Hemoglobin: 10.1 g/dL — ABNORMAL LOW (ref 13.0–17.0)
MCH: 30.5 pg (ref 26.0–34.0)
MCHC: 32.7 g/dL (ref 30.0–36.0)
MCV: 93.4 fL (ref 80.0–100.0)
Platelets: 109 10*3/uL — ABNORMAL LOW (ref 150–400)
RBC: 3.31 MIL/uL — ABNORMAL LOW (ref 4.22–5.81)
RDW: 14.4 % (ref 11.5–15.5)
WBC: 5.9 10*3/uL (ref 4.0–10.5)
nRBC: 0 % (ref 0.0–0.2)

## 2019-09-13 LAB — URINE CULTURE: Culture: >=100000 — AB

## 2019-09-13 LAB — GLUCOSE, CAPILLARY
Glucose-Capillary: 93 mg/dL (ref 70–99)
Glucose-Capillary: 109 mg/dL — ABNORMAL HIGH (ref 70–99)
Glucose-Capillary: 124 mg/dL — ABNORMAL HIGH (ref 70–99)
Glucose-Capillary: 122 mg/dL — ABNORMAL HIGH (ref 70–99)

## 2019-09-13 LAB — ECHOCARDIOGRAM COMPLETE
Height: 72 in
Weight: 5298.09 oz

## 2019-09-13 MED ORDER — POTASSIUM CHLORIDE CRYS ER 20 MEQ PO TBCR
40.0000 meq | EXTENDED_RELEASE_TABLET | Freq: Once | ORAL | Status: AC
  Administered 2019-09-13: 40 meq via ORAL
  Filled 2019-09-13: qty 2

## 2019-09-13 MED ORDER — PERFLUTREN LIPID MICROSPHERE
1.0000 mL | INTRAVENOUS | Status: AC | PRN
  Administered 2019-09-13: 4 mL via INTRAVENOUS
  Filled 2019-09-13: qty 10

## 2019-09-13 MED ORDER — SODIUM CHLORIDE 0.9 % IV SOLN
2.0000 g | INTRAVENOUS | Status: DC
  Administered 2019-09-13 – 2019-09-17 (×24): 2 g via INTRAVENOUS
  Filled 2019-09-13 (×9): qty 2
  Filled 2019-09-13: qty 2000
  Filled 2019-09-13 (×2): qty 2
  Filled 2019-09-13 (×5): qty 2000
  Filled 2019-09-13 (×2): qty 2
  Filled 2019-09-13 (×2): qty 2000
  Filled 2019-09-13 (×8): qty 2

## 2019-09-13 MED ORDER — FLUCONAZOLE 100 MG PO TABS
100.0000 mg | ORAL_TABLET | Freq: Every day | ORAL | Status: DC
  Administered 2019-09-13 – 2019-09-17 (×5): 100 mg via ORAL
  Filled 2019-09-13 (×5): qty 1

## 2019-09-13 MED ORDER — ORAL CARE MOUTH RINSE
15.0000 mL | Freq: Two times a day (BID) | OROMUCOSAL | Status: DC
  Administered 2019-09-13 – 2019-09-17 (×6): 15 mL via OROMUCOSAL

## 2019-09-13 MED ORDER — CHLORHEXIDINE GLUCONATE 0.12 % MT SOLN
15.0000 mL | Freq: Two times a day (BID) | OROMUCOSAL | Status: DC
  Administered 2019-09-13 – 2019-09-17 (×8): 15 mL via OROMUCOSAL
  Filled 2019-09-13 (×9): qty 15

## 2019-09-13 MED ORDER — METOPROLOL SUCCINATE ER 50 MG PO TB24
50.0000 mg | ORAL_TABLET | Freq: Every day | ORAL | Status: DC
  Administered 2019-09-13 – 2019-09-17 (×5): 50 mg via ORAL
  Filled 2019-09-13 (×4): qty 1
  Filled 2019-09-13: qty 2

## 2019-09-13 NOTE — Progress Notes (Signed)
Patient ID: OVILA LEPAGE, male   DOB: [birth year], 79 y.o.   MRN: [id number]         [facility]    Date of Admission:  09/11/2019   Total days of antibiotics 3        Day 1 ampicillin               Reason for Consult: Automatic consultation for enterococcal bacteremia     Assessment: He developed enterococcal bacteremia complicating enterococcal UTI and recent Foley catheter placement.  He is improving on broad empiric antibiotic therapy.  He is at increased risk of endocarditis given that he underwent TAVR in 2017 and also has an AICD.  I will obtain repeat blood cultures and a TTE.  Plan: 1. Ampicillin 2. Repeat blood cultures 3. Transthoracic echocardiogram 4. Dr. Tommy Medal will follow up on Monday, 09/15/2019  Principal Problem:   Enterococcal bacteremia Active Problems:   Sepsis ([place])   Acute lower UTI   AICD (automatic cardioverter/defibrillator) present   S/P TAVR (transcatheter aortic valve replacement)   Obesity   OBSTRUCTIVE SLEEP APNEA   Essential hypertension, benign   Venous insufficiency of both lower extremities   COPD (chronic obstructive pulmonary disease) (HCC)   CAD (coronary artery disease), native coronary artery   Paroxysmal atrial fibrillation (HCC)   Moderate aortic stenosis   Diabetic neuropathy, painful ([place])   History of transmetatarsal amputation of left foot (HCC)   Pressure injury of skin   Urinary retention   Phimosis   Scheduled Meds: . allopurinol  300 mg Oral Daily  . chlorhexidine  15 mL Mouth Rinse BID  . Chlorhexidine Gluconate Cloth  6 each Topical Daily  . cyclobenzaprine  5 mg Oral QHS  . ezetimibe  10 mg Oral Daily  . finasteride  5 mg Oral Daily  . gabapentin  800 mg Oral QID  . insulin aspart  0-9 Units Subcutaneous TID WC  . mouth rinse  15 mL Mouth Rinse q12n4p  . niacin  1,000 mg Oral BID  . pantoprazole  40 mg Oral Daily  . rosuvastatin  40 mg Oral Daily   Continuous Infusions: .  ampicillin (OMNIPEN) IV     PRN Meds:.acetaminophen **OR** acetaminophen, albuterol, nitroGLYCERIN, ondansetron **OR** ondansetron (ZOFRAN) IV, oxybutynin  HPI: KENLEE MALER is a 79 y.o. male with multiple medical problems who developed acute urinary retention and dysuria leading him to come to the hospital on 09/09/2019.  It was discovered that he had severe phimosis and probable fungal balanitis.  A Foley catheter was placed.  Urinalysis revealed pyuria and microscopic hematuria.  He was discharged on oral fluconazole.  Urine culture grew Enterococcus and Aerococcus.  He developed high fever and confusion leading to admission on 09/11/2019.  Urine and blood cultures are growing Enterococcus.   Review of Systems: Review of Systems  Constitutional: Positive for chills, fever and malaise/fatigue. Negative for diaphoresis and weight loss.  Respiratory: Negative for cough, sputum production and shortness of breath.   Cardiovascular: Negative for chest pain.  Gastrointestinal: Negative for abdominal pain, diarrhea, nausea and vomiting.  Genitourinary: Positive for dysuria.  Musculoskeletal: Positive for back pain. Negative for joint pain.  Skin: Negative for rash.  Neurological: Negative for headaches.  Psychiatric/Behavioral: Positive for hallucinations.    Past Medical History:  Diagnosis Date  . AICD (automatic cardioverter/defibrillator) present   . ASTHMA   . Asthma    as a child  . BPH (benign prostatic hypertrophy)   .  CAD (coronary artery disease)    Nonobstructive by 12/2012 cath; then 03/2016 he required BMS to RCA ([facility]).  In-stent restenosis on cath 11/01/16, baloon angioplasty successful--pt to take plavix (no ASA), + eliquis (for PAF).  [name] CATARACT, HX OF   . Chronic combined systolic and diastolic CHF (congestive heart failure) ([place]) 05/2016   Ischemic CM.  04/2018 EF 40-45%, grd I DD.  [name] Chronic pain syndrome    Lumbar DDD; chronic neuropathic pain (DM); has spinal  stimulator and sees pain mgmt MD  . COPD   . Diabetes mellitus type 2 with complications (HCC)    POE4M jumped from 5.7% to 6.1% 03/2015---started metformin at that time.  DM 2 dx by fasting gluc criteria 2018.  Has chronic neuropath pain  . Elevated transaminase level 02/2016   Suspect due to fatty liver (documented on u/s 2007). Plan repeat labs 03/2016.  [name] Essential hypertension, benign   . Fatty liver 2007   2007 u/s showed fatty liver with hepatosplenomegaly.  2019 repeat u/s->fatty liver but no cirrhosis or hepatosplenomegaly.  [name] GERD (gastroesophageal reflux disease)   . Glaucoma   . GOUT   . Hallux valgus (acquired)   . HH (hiatus hernia)   . HYPERCHOLESTEROLEMIA-PURE   . Hypogonadism male   . Lumbosacral neuritis   . Lumbosacral spondylosis    Lumbar spinal stenosis with neurogenic claudication--contributes to his chronic pain syndrome  . Normal memory function 08/2014   Neuropsychological testing ([facility]): no cognitive impairment or sign of neurodegenerative disorder.  Likely has adjustment d/o with mixed anxiety/depressed features and may benefit from low dose SNRI.    [name] Normocytic anemia 03/2016   Mild-pt needs ferritin and vit B12 level checked (as of 03/22/16)  . NSTEMI (non-ST elevated myocardial infarction) ([place]) 03/20/2016   BMS to RCA  . Obesity hypoventilation syndrome ([place])   . Obesity, unspecified   . Orthostatic hypotension   . OSA on CPAP    8 cm H2O  . OSTEOARTHRITIS   . Paroxysmal atrial fibrillation ([place]) 2003    (? chronic?) Off anticoag for a while due to falls.  Then apixaban started 12/2014.  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 Ella Bodo): neuropathic pain->diff to treat, failed nucynta, failed spinal stimul trial, oxycontin hs + tramadol + gabap as of 12/2017 f/u Dr. Letta Pate.  . Personal history of colonic adenoma 10/30/2012   Diminutive adenoma, consider repeat 2019 per GI  . Pneumonia   .  Presence of cardiac defibrillator 11/07/2017  . Primary osteoarthritis of both knees    Bone on bone of medial compartments, + signif patellofemoral arth bilat.--supartz inj series started 09/12/17  . PUD (peptic ulcer disease)   . PULMONARY HYPERTENSION, HX OF   . Secondary male hypogonadism 2017  . 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 Shortness of breath    with exertion: much improved s/p TAVR and treatment for CHF.  . Sick sinus syndrome (HCC)    PPM placed  . Thrombocytopenia ([place]) 2018   HSM on 2007 abd u/s---suspect some mild splenic sequestration chronically.  [name] Unspecified glaucoma(365.9)   . Unspecified hereditary and idiopathic peripheral neuropathy approx age 54   bilat LE's, ? left arm, too.  Feet became progressively numb + left foot pain intermittently.  Pt may be trying a spinal stimulator (as of 05/2015)  . VENOUS INSUFFICIENCY    Being followed  by Dr. Sharol Given as of 10/2016 for two R LL venous stasis ulcers/skin tears.  Healed as of 10/30/16 f/u with Dr. Sharol Given.  . VENTRAL HERNIA     Social History   Tobacco Use  . Smoking status: Never Smoker  . Smokeless tobacco: Never Used  Substance Use Topics  . Alcohol use: No    Alcohol/week: 0.0 standard drinks  . Drug use: No    Types: Oxycodone    Family History  Problem Relation Age of Onset  . Hypertension Mother   . Coronary artery disease Mother   . Heart attack Mother   . Neuropathy Mother   . Pulmonary fibrosis Father        asbestosis   Allergies  Allergen Reactions  . Brimonidine Tartrate Shortness Of Breath    Alphagan-Shortness of breath  . Brimonidine Tartrate Palpitations  . Brinzolamide Shortness Of Breath    AZOPT- Shortness of breath  . Latanoprost Shortness Of Breath    XALATAN- Shortness of breath  . Nucynta [Tapentadol] Shortness Of Breath  . Sulfa Antibiotics Palpitations  . Timolol Maleate Shortness Of Breath and Other (See  Comments)    TIMOPTIC- Aggravated asthma  . Cardizem  [Diltiazem Hcl] Swelling  . Diltiazem Swelling     leg swelling  . Rofecoxib Swelling     VIOXX- leg swelling  . Vancomycin Hives and Other (See Comments)    Possible "Red Man Syndrome"? > hives/blisters  . Codeine Other (See Comments)    Childhood reaction  . Celecoxib Other (See Comments)    CELLBREX-confusion  . Colchicine Diarrhea    diarrhea  . Tape Rash    OBJECTIVE: Blood pressure (!) 156/72, pulse 91, temperature 97.6 F (36.4 C), temperature source Axillary, resp. rate 18, height 6' (1.829 m), weight (!) 150.2 kg, SpO2 100 %.  Physical Exam Constitutional:      Comments: Is resting quietly in bed.  He says that he is feeling better "physically" but is bothered by bad dreams and hallucinations.  Eyes:     Conjunctiva/sclera: Conjunctivae normal.  Cardiovascular:     Rate and Rhythm: Normal rate and regular rhythm.     Heart sounds: No murmur.     Comments: He has very distant heart sounds. Pulmonary:     Effort: Pulmonary effort is normal.     Breath sounds: Normal breath sounds.  Chest:    Abdominal:     Palpations: Abdomen is soft.     Tenderness: There is no abdominal tenderness. There is no right CVA tenderness or left CVA tenderness.  Musculoskeletal:     Right lower leg: Edema present.     Left lower leg: Edema present.     Comments: He is status post left transmetatarsal amputation without evidence of active infection.  He has chronic venous stasis of his lower extremities.  Skin:    Findings: No rash.  Neurological:     General: No focal deficit present.  Psychiatric:        Mood and Affect: Mood normal.     Lab Results Lab Results  Component Value Date   WBC 5.9 09/13/2019   HGB 10.1 (L) 09/13/2019   HCT 30.9 (L) 09/13/2019   MCV 93.4 09/13/2019   PLT 109 (L) 09/13/2019    Lab Results  Component Value Date   CREATININE 0.80 09/13/2019   BUN 15 09/13/2019   NA 138 09/13/2019   K  3.3 (L) 09/13/2019   CL 104 09/13/2019   CO2 25 09/13/2019  Lab Results  Component Value Date   ALT 183 (H) 09/13/2019   AST 268 (H) 09/13/2019   ALKPHOS 181 (H) 09/13/2019   BILITOT 1.0 09/13/2019     Microbiology: Recent Results (from the past 240 hour(s))  Urine culture     Status: Abnormal   Collection Time: 09/09/19  5:32 PM   Specimen: Urine, Clean Catch  Result Value Ref Range Status   Specimen Description URINE, CLEAN CATCH  Final   Special Requests   Final    NONE Performed at [facility], [place] [address], [place], [place] [address]    Culture (A)  Final    >=100,000 COLONIES/mL ENTEROCOCCUS FAECALIS >=100,000 COLONIES/mL AEROCOCCUS URINAE    Report Status 09/12/2019 FINAL  Final   Organism ID, Bacteria ENTEROCOCCUS FAECALIS (A)  Final      Susceptibility   Enterococcus faecalis - MIC*    AMPICILLIN <=2 SENSITIVE Sensitive     NITROFURANTOIN <=16 SENSITIVE Sensitive     VANCOMYCIN 1 SENSITIVE Sensitive     * >=100,000 COLONIES/mL ENTEROCOCCUS FAECALIS  Blood Culture (routine x 2)     Status: None (Preliminary result)   Collection Time: 09/11/19 11:38 PM   Specimen: BLOOD  Result Value Ref Range Status   Specimen Description   Final    BLOOD RIGHT ANTECUBITAL Performed at [place] [address] [place], [place] [address]    Special Requests   Final    BOTTLES DRAWN AEROBIC AND ANAEROBIC Blood Culture adequate volume Performed at [place] [address] [place], [place] [address]    Culture  Setup Time   Final    IN BOTH AEROBIC AND ANAEROBIC BOTTLES GRAM POSITIVE COCCI IN CHAINS CRITICAL RESULT CALLED TO, READ BACK BY AND VERIFIED WITHGuadlupe [name] 09/12/19 1712 JDW Performed at [facility], [address] [place], [place] [address]    Culture GRAM POSITIVE COCCI  Final   Report Status PENDING  Incomplete  Urine culture     Status: Abnormal   Collection Time: 09/11/19 11:38 PM    Specimen: In/Out Cath Urine  Result Value Ref Range Status   Specimen Description   Final    IN/OUT CATH URINE Performed at [facility], [place] [address] [place], [place] [address]    Special Requests   Final    NONE Performed at [facility], [place] [address], [place], [place] [address]    Culture >=100,000 COLONIES/mL ENTEROCOCCUS FAECALIS (A)  Final   Report Status 09/13/2019 FINAL  Final   Organism ID, Bacteria ENTEROCOCCUS FAECALIS (A)  Final      Susceptibility   Enterococcus faecalis - MIC*    AMPICILLIN <=2 SENSITIVE Sensitive     NITROFURANTOIN <=16 SENSITIVE Sensitive     VANCOMYCIN 1 SENSITIVE Sensitive     * >=100,000 COLONIES/mL ENTEROCOCCUS FAECALIS  Blood Culture (routine x 2)     Status: None (Preliminary result)   Collection Time: 09/11/19 11:43 PM   Specimen: BLOOD RIGHT FOREARM  Result Value Ref Range Status   Specimen Description   Final    BLOOD RIGHT FOREARM Performed at [facility], [place] [address], [place],  [address]    Special Requests   Final    BOTTLES DRAWN AEROBIC ONLY Blood Culture results may not be optimal due to an inadequate volume of blood received in culture bottles Performed at [place] [address] [place], [place]  [address]    Culture  Setup Time   Final    AEROBIC BOTTLE ONLY GRAM POSITIVE COCCI IN CHAINS CRITICAL RESULT CALLED TO, READ BACK BY AND VERIFIED WITH: Guadlupe Spanish PHARMD 09/12/19 2100 Performed at [facility], [place] [address], [place], [place] [address]    Culture GRAM POSITIVE COCCI  Final   Report Status PENDING  Incomplete  Blood Culture ID Panel (Reflexed)     Status: Abnormal   Collection Time: 09/11/19 11:43 PM  Result Value Ref Range Status   Enterococcus species DETECTED (A) NOT DETECTED Final    Comment: CRITICAL RESULT CALLED TO, READ BACK BY AND VERIFIED WITH: N GLOGOVAC PHARMD 09/12/19 2100 JDW    Vancomycin resistance NOT DETECTED  NOT DETECTED Final   Listeria monocytogenes NOT DETECTED NOT DETECTED Final   Staphylococcus species NOT DETECTED NOT DETECTED Final   Staphylococcus aureus (BCID) NOT DETECTED NOT DETECTED Final   Streptococcus species NOT DETECTED NOT DETECTED Final   Streptococcus agalactiae NOT DETECTED NOT DETECTED Final   Streptococcus pneumoniae NOT DETECTED NOT DETECTED Final   Streptococcus pyogenes NOT DETECTED NOT DETECTED Final   Acinetobacter baumannii NOT DETECTED NOT DETECTED Final   Enterobacteriaceae species NOT DETECTED NOT DETECTED Final   Enterobacter cloacae complex NOT DETECTED NOT DETECTED Final   Escherichia coli NOT DETECTED NOT DETECTED Final   Klebsiella oxytoca NOT DETECTED NOT DETECTED Final   Klebsiella pneumoniae NOT DETECTED NOT DETECTED Final   Proteus species NOT DETECTED NOT DETECTED Final   Serratia marcescens NOT DETECTED NOT DETECTED Final   Haemophilus influenzae NOT DETECTED NOT DETECTED Final   Neisseria meningitidis NOT DETECTED NOT DETECTED Final   Pseudomonas aeruginosa NOT DETECTED NOT DETECTED Final   Candida albicans NOT DETECTED NOT DETECTED Final   Candida glabrata NOT DETECTED NOT DETECTED Final   Candida krusei NOT DETECTED NOT DETECTED Final   Candida parapsilosis NOT DETECTED NOT DETECTED Final   Candida tropicalis NOT DETECTED NOT DETECTED Final    Comment: Performed at [facility], [place]. [address] [place], [place] [address]  SARS CORONAVIRUS 2 (TAT 6-24 HRS) Nasopharyngeal Nasopharyngeal Swab     Status: None   Collection Time: 09/12/19  1:44 AM   Specimen: Nasopharyngeal Swab  Result Value Ref Range Status   SARS Coronavirus 2 NEGATIVE NEGATIVE Final    Comment: (NOTE) SARS-CoV-2 target nucleic acids are NOT DETECTED. The SARS-CoV-2 RNA is generally detectable in upper and lower respiratory specimens during the acute phase of infection. Negative results do not preclude SARS-CoV-2 infection, do not rule out co-infections with other  pathogens, and should not be used as the sole basis for treatment or other patient management decisions. Negative results must be combined with clinical observations, patient history, and epidemiological information. The expected result is Negative. Fact Sheet for Patients: SugarRoll.be Fact Sheet for Healthcare Providers: https://www.woods-mathews.com/ This test is not yet approved or cleared by the [place] FDA and  has been authorized for detection and/or diagnosis of SARS-CoV-2 by FDA under an Emergency Use Authorization (EUA). This EUA will remain  in effect (meaning this test can be used) for the duration of the COVID-19 declaration under Section 56 4(b)(1) of the Act, 21 U.S.C. section 360bbb-3(b)(1), unless the authorization is terminated or revoked sooner. Performed at [facility], [place] [address] [place], [place] [address]   MRSA PCR Screening     Status: None   Collection Time: 09/12/19 10:03 PM   Specimen: Nasal Mucosa; Nasopharyngeal  Result Value Ref Range Status   MRSA by PCR NEGATIVE NEGATIVE Final    Comment:        The GeneXpert MRSA Assay (FDA approved for NASAL specimens only), is one component of a comprehensive MRSA colonization surveillance program. It is not intended to diagnose MRSA infection nor to guide or monitor treatment for MRSA infections. Performed at [facility], [place] [address], [place], [place] [address]     Michel Bickers, [place] [facility] [place] [phone] pager   [phone] cell 09/13/2019, 11:30 AM

## 2019-09-13 NOTE — Plan of Care (Signed)
  Problem: Education: Goal: Knowledge of General Education information will improve Description: Including pain rating scale, medication(s)/side effects and non-pharmacologic comfort measures Outcome: Progressing   Problem: Clinical Measurements: Goal: Respiratory complications will improve Outcome: Progressing   Problem: Nutrition: Goal: Adequate nutrition will be maintained Outcome: Progressing   Problem: Coping: Goal: Level of anxiety will decrease Outcome: Progressing   Problem: Safety: Goal: Ability to remain free from injury will improve Outcome: Progressing

## 2019-09-13 NOTE — Progress Notes (Signed)
Urology Inpatient Progress Report  Infection [B99.9] Cellulitis of foot [T46.568] Hematuria, gross [R31.0] SIRS (systemic inflammatory response syndrome) ([place]) [R65.10] Enterococcus UTI [N39.0, B95.2] Sepsis ([place]) [A41.9]        Intv/Subj: Patient admitted to stepdown from ER last night.  Urine and blood cultures positive for Enterococcus.  Patient's Foley was changed over a wire yesterday and is draining well.  Principal Problem:   Sepsis ([place]) Active Problems:   Essential hypertension, benign   Venous insufficiency of both lower extremities   COPD (chronic obstructive pulmonary disease) (HCC)   CAD (coronary artery disease), native coronary artery   Paroxysmal atrial fibrillation (HCC)   Moderate aortic stenosis   Acute lower UTI   Pressure injury of skin  Current Facility-Administered Medications  Medication Dose Route Frequency Provider Last Rate Last Admin  . acetaminophen (TYLENOL) tablet 650 mg  650 mg Oral Q6H PRN Rise Patience, MD   650 mg at 09/13/19 1275   Or  . acetaminophen (TYLENOL) suppository 650 mg  650 mg Rectal Q6H PRN Rise Patience, MD      . albuterol (PROVENTIL) (2.5 MG/3ML) 0.083% nebulizer solution 2.5 mg  2.5 mg Nebulization Q6H PRN Rise Patience, MD      . allopurinol (ZYLOPRIM) tablet 300 mg  300 mg Oral Daily Rise Patience, MD   300 mg at 09/12/19 1008  . chlorhexidine (PERIDEX) 0.12 % solution 15 mL  15 mL Mouth Rinse BID Hongalgi, Anand D, MD      . Chlorhexidine Gluconate Cloth 2 % PADS 6 each  6 each Topical Daily Modena Jansky, MD   6 each at 09/12/19 2113  . cyclobenzaprine (FLEXERIL) tablet 5 mg  5 mg Oral QHS Rise Patience, MD   5 mg at 09/12/19 2132  . ezetimibe (ZETIA) tablet 10 mg  10 mg Oral Daily Rise Patience, MD   10 mg at 09/12/19 1008  . finasteride (PROSCAR) tablet 5 mg  5 mg Oral Daily Rise Patience, MD   5 mg at 09/12/19 1008  . gabapentin (NEURONTIN) capsule 800 mg  800 mg  Oral QID Rise Patience, MD   800 mg at 09/12/19 2132  . insulin aspart (novoLOG) injection 0-9 Units  0-9 Units Subcutaneous TID WC Rise Patience, MD   1 Units at 09/12/19 1340  . linezolid (ZYVOX) IVPB 600 mg  600 mg Intravenous Q12H Modena Jansky, MD   Stopped at 09/13/19 0247  . MEDLINE mouth rinse  15 mL Mouth Rinse q12n4p Hongalgi, Anand D, MD      . niacin (NIASPAN) CR tablet 1,000 mg  1,000 mg Oral BID Rise Patience, MD   Stopped at 09/12/19 2200  . nitroGLYCERIN (NITROSTAT) SL tablet 0.4 mg  0.4 mg Sublingual Q5 min PRN Rise Patience, MD      . ondansetron [facility]) tablet 4 mg  4 mg Oral Q6H PRN Rise Patience, MD       Or  . ondansetron [facility]) injection 4 mg  4 mg Intravenous Q6H PRN Rise Patience, MD      . oxybutynin (DITROPAN) tablet 5 mg  5 mg Oral Q8H PRN Jacalyn Lefevre D, MD   5 mg at 09/12/19 1156  . pantoprazole (PROTONIX) EC tablet 40 mg  40 mg Oral Daily Rise Patience, MD   40 mg at 09/12/19 1009  . piperacillin-tazobactam (ZOSYN) IVPB 3.375 g  3.375 g Intravenous Q8H Rise Patience, MD 12.5  mL/hr at 09/13/19 0612 Rate Verify at 09/13/19 0612  . rosuvastatin (CRESTOR) tablet 40 mg  40 mg Oral Daily Rise Patience, MD   40 mg at 09/12/19 1008     Objective: Vital: Vitals:   09/13/19 0400 09/13/19 0613 09/13/19 0700 09/13/19 0800  BP:   (!) 125/28   Pulse:      Resp:   19   Temp: (!) 101 F (38.3 C)   97.6 F (36.4 C)  TempSrc: Axillary   Axillary  SpO2:      Weight:  (!) 150.2 kg    Height:       I/Os: I/O last 3 completed shifts: In: 1776.3 [IV Piggyback:1776.3] Out: 9735 [Urine:1350]  Physical Exam:  General: Patient is in no apparent distress Lungs: Normal respiratory effort, chest expands symmetrically. GI: The abdomen is soft and nontender without mass. Foley: draining dark tea colored urine, no clots  Ext: lower extremities symmetric  Lab Results: Recent Labs    09/11/19 2343  09/12/19 0454 09/13/19 0633  WBC 13.9* 12.0* 5.9  HGB 10.8* 10.1* 10.1*  HCT 33.9* 31.3* 30.9*   Recent Labs    09/11/19 2343 09/12/19 0454 09/13/19 0633  NA 138 138 138  K 4.2 3.7 3.3*  CL 102 103 104  CO2 27 24 25   GLUCOSE 146* 141* 108*  BUN 19 19 15   CREATININE 1.08 1.02 0.80  CALCIUM 8.5* 8.2* 8.4*   Recent Labs    09/11/19 2343  INR 1.2   No results for input(s): LABURIN in the last 72 hours. Results for orders placed or performed during the hospital encounter of 09/11/19  Blood Culture (routine x 2)     Status: None (Preliminary result)   Collection Time: 09/11/19 11:38 PM   Specimen: BLOOD  Result Value Ref Range Status   Specimen Description   Final    BLOOD RIGHT ANTECUBITAL Performed at [facility] [address] [place], [place] [address]    Special Requests   Final    BOTTLES DRAWN AEROBIC AND ANAEROBIC Blood Culture adequate volume Performed at [facility] [address], [place], [place] [address]    Culture  Setup Time   Final    IN BOTH AEROBIC AND ANAEROBIC BOTTLES GRAM POSITIVE COCCI IN CHAINS CRITICAL RESULT CALLED TO, READ BACK BY AND VERIFIED WITHGuadlupe Spanish [facility] 09/12/19 1712 JDW Performed at [facility], [address] [place], [place] [address]    Culture GRAM POSITIVE COCCI  Final   Report Status PENDING  Incomplete  Urine culture     Status: Abnormal (Preliminary result)   Collection Time: 09/11/19 11:38 PM   Specimen: In/Out Cath Urine  Result Value Ref Range Status   Specimen Description   Final    IN/OUT CATH URINE Performed at [facility], [place] [address], [place], [place] [address]    Special Requests   Final    NONE Performed at [facility], [place] [address] [place], [place] [address]    Culture (A)  Final    >=100,000 COLONIES/mL GRAM POSITIVE COCCI IDENTIFICATION AND SUSCEPTIBILITIES TO FOLLOW Performed at [facility],  [place] [address], [place], [place] [address]    Report Status PENDING  Incomplete  Blood Culture (routine x 2)     Status: None (Preliminary result)   Collection Time: 09/11/19 11:43 PM   Specimen: BLOOD RIGHT FOREARM  Result Value Ref Range Status   Specimen Description  Final    BLOOD RIGHT FOREARM Performed at [facility], [place] [address] [place], [place] [address]    Special Requests   Final    BOTTLES DRAWN AEROBIC ONLY Blood Culture results may not be optimal due to an inadequate volume of blood received in culture bottles Performed at [facility] [address], [place], [place] [address]    Culture  Setup Time   Final    AEROBIC BOTTLE ONLY GRAM POSITIVE COCCI IN CHAINS CRITICAL RESULT CALLED TO, READ BACK BY AND VERIFIED WITHGuadlupe [facility] 09/12/19 2100 Performed at [facility], [place] [address], [place], [place] [address]    Culture GRAM POSITIVE COCCI  Final   Report Status PENDING  Incomplete  Blood Culture ID Panel (Reflexed)     Status: Abnormal   Collection Time: 09/11/19 11:43 PM  Result Value Ref Range Status   Enterococcus species DETECTED (A) NOT DETECTED Final    Comment: CRITICAL RESULT CALLED TO, READ BACK BY AND VERIFIED WITH: N GLOGOVAC PHARMD 09/12/19 2100 JDW    Vancomycin resistance NOT DETECTED NOT DETECTED Final   Listeria monocytogenes NOT DETECTED NOT DETECTED Final   Staphylococcus species NOT DETECTED NOT DETECTED Final   Staphylococcus aureus (BCID) NOT DETECTED NOT DETECTED Final   Streptococcus species NOT DETECTED NOT DETECTED Final   Streptococcus agalactiae NOT DETECTED NOT DETECTED Final   Streptococcus pneumoniae NOT DETECTED NOT DETECTED Final   Streptococcus pyogenes NOT DETECTED NOT DETECTED Final   Acinetobacter baumannii NOT DETECTED NOT DETECTED Final   Enterobacteriaceae species NOT DETECTED NOT DETECTED Final   Enterobacter cloacae complex NOT DETECTED NOT DETECTED Final   Escherichia coli NOT  DETECTED NOT DETECTED Final   Klebsiella oxytoca NOT DETECTED NOT DETECTED Final   Klebsiella pneumoniae NOT DETECTED NOT DETECTED Final   Proteus species NOT DETECTED NOT DETECTED Final   Serratia marcescens NOT DETECTED NOT DETECTED Final   Haemophilus influenzae NOT DETECTED NOT DETECTED Final   Neisseria meningitidis NOT DETECTED NOT DETECTED Final   Pseudomonas aeruginosa NOT DETECTED NOT DETECTED Final   Candida albicans NOT DETECTED NOT DETECTED Final   Candida glabrata NOT DETECTED NOT DETECTED Final   Candida krusei NOT DETECTED NOT DETECTED Final   Candida parapsilosis NOT DETECTED NOT DETECTED Final   Candida tropicalis NOT DETECTED NOT DETECTED Final    Comment: Performed at [facility], [place]. [address], [place], [place] [address]  SARS CORONAVIRUS 2 (TAT 6-24 HRS) Nasopharyngeal Nasopharyngeal Swab     Status: None   Collection Time: 09/12/19  1:44 AM   Specimen: Nasopharyngeal Swab  Result Value Ref Range Status   SARS Coronavirus 2 NEGATIVE NEGATIVE Final    Comment: (NOTE) SARS-CoV-2 target nucleic acids are NOT DETECTED. The SARS-CoV-2 RNA is generally detectable in upper and lower respiratory specimens during the acute phase of infection. Negative results do not preclude SARS-CoV-2 infection, do not rule out co-infections with other pathogens, and should not be used as the sole basis for treatment or other patient management decisions. Negative results must be combined with clinical observations, patient history, and epidemiological information. The expected result is Negative. Fact Sheet for Patients: SugarRoll.be Fact Sheet for Healthcare Providers: https://www.woods-mathews.com/ This test is not yet approved or cleared by the [place] FDA and  has been authorized for detection and/or diagnosis of SARS-CoV-2 by FDA under an Emergency Use Authorization (EUA). This EUA will remain  in effect (meaning this test  can be  used) for the duration of the COVID-19 declaration under Section 56 4(b)(1) of the Act, 21 U.S.C. section 360bbb-3(b)(1), unless the authorization is terminated or revoked sooner. Performed at [facility], [place] [address] [place], [place] [address]   MRSA PCR Screening     Status: None   Collection Time: 09/12/19 10:03 PM   Specimen: Nasal Mucosa; Nasopharyngeal  Result Value Ref Range Status   MRSA by PCR NEGATIVE NEGATIVE Final    Comment:        The GeneXpert MRSA Assay (FDA approved for NASAL specimens only), is one component of a comprehensive MRSA colonization surveillance program. It is not intended to diagnose MRSA infection nor to guide or monitor treatment for MRSA infections. Performed at [name], [place] [address] [place], [place] [address]    *Note: Due to a large number of results and/or encounters for the requested time period, some results have not been displayed. A complete set of results can be found in Results Review.    Studies/Results: US RENAL  Result Date: 09/12/2019 CLINICAL DATA:  79 year old male with gross hematuria. EXAM: RENAL / URINARY TRACT ULTRASOUND COMPLETE COMPARISON:  09/09/2019 CT FINDINGS: Right Kidney: Renal measurements: 10.6 x 5.6 x 5.7 cm = volume: 177 mL . Echogenicity within normal limits. No mass or hydronephrosis visualized. Left Kidney: Renal measurements: 13.8 x 5.1 x 5.5 cm = volume: 202 mL. Echogenicity within normal limits. No mass or hydronephrosis visualized. Bladder: No gross bladder abnormalities are noted. Bladder volume is 566 cc. A Foley catheter tip is not identified within the bladder. Other: None. IMPRESSION: 1. Unremarkable kidneys. 2. Foley catheter tip is not identified within the bladder and tip may lie within the urethra. Correlate clinically. No gross bladder abnormality. Electronically Signed   By: Margarette Canada M.D.   On: 09/12/2019 13:41   DG Chest Port 1 View  Result Date:  09/12/2019 CLINICAL DATA:  Fever. EXAM: PORTABLE CHEST 1 VIEW COMPARISON:  December 23, 2012 FINDINGS: The heart size is enlarged. 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 There is no pneumothorax. There is prominence of the perihilar regions bilaterally which is nonspecific. There is no focal infiltrate. IMPRESSION: 1. No definite acute cardiopulmonary process. 2. Prominent hila bilaterally. This is nonspecific but may be secondary to pulmonary artery hypertension. An outpatient two-view chest x-ray is recommended for further evaluation. Electronically Signed   By: Constance Holster M.D.   On: 09/12/2019 00:26   DG Foot Complete Left  Result Date: 09/12/2019 CLINICAL DATA:  Infection. EXAM: LEFT FOOT - COMPLETE 3+ VIEW COMPARISON:  08/26/2019 FINDINGS: The patient is status post prior transmetatarsal amputation of the left foot. There is some surrounding soft tissue swelling. There is no radiographic evidence for osteomyelitis. There are no pockets of subcutaneous gas. There is a stable foreign body projecting within the plantar soft tissues at the level of the midfoot. There is a moderate-sized plantar calcaneal spur. IMPRESSION: 1. Postsurgical changes as detailed above without evidence for osteomyelitis. 2. Nonspecific soft tissue swelling is noted about the foot. 3. Stable foreign body in the plantar soft tissues as detailed above. Electronically Signed   By: Constance Holster M.D.   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  Plan: -Patient to be treated for fungal balanitis with Diflucan and antibiotics tailored to Enterococcus  based on urine and blood cultures -Continue Foley catheter and patient to follow-up as an outpatient with Dr. Tresa Moore to discuss surgical revision -Hold anticoagulation and urology to reassess urine tomorrow   Jacalyn Lefevre, MD Urology 09/13/2019, 9:04 AM

## 2019-09-13 NOTE — Progress Notes (Addendum)
PROGRESS NOTE   Tim [name]  JTT:[id number]    DOB: 05/19/1941    DOA: 09/11/2019  PCP: Tammi Sou, MD   I have briefly reviewed patients previous medical records in [facility].  Chief Complaint:   Chief Complaint  Patient presents with  . Code Sepsis    Brief Narrative:  79 year old married male, ambulates with the help of a cane, extensive PMH including CAD status post PCI, cardiomyopathy, chronic combined CHF, TAVR, A. fib on Eliquis currently on hold for upcoming procedure, PPM/AICD, follows with cardiologist with [facility], type II DM with peripheral neuropathy, chronic pain for which he sees pain MD, OSA on nightly CPAP, COPD/asthma not on home oxygen, HTN, GERD, HLD, s/p left transmetatarsal amputation for osteomyelitis by Dr. Sharol Given on 08/29/2019, seen in ED on 09/09/2019 for decreased urination and pain, seen by Dr. Tresa Moore, urology, diagnosed with balanitis, urinary catheter placed and supposed to have a procedure next week presented to the ED with complaints of high fevers (104 F), chills and confusion. Admitted for sepsis due to enterococcal bacteremia complicating enterococcal UTI, balanitis and left TMA stump infection/cellulitis. Treated with IV fluids and empiric antibiotics.  Orthopedics, urology and ID consulted.  Improved.  Transferred from stepdown to telemetry on 3/13.  Assessment & Plan:  Principal Problem:   Enterococcal bacteremia Active Problems:   Obesity   OBSTRUCTIVE SLEEP APNEA   Essential hypertension, benign   Venous insufficiency of both lower extremities   COPD (chronic obstructive pulmonary disease) (HCC)   CAD (coronary artery disease), native coronary artery   Paroxysmal atrial fibrillation (HCC)   Moderate aortic stenosis   Diabetic neuropathy, painful (HCC)   Sepsis ([place])   Acute lower UTI   History of transmetatarsal amputation of left foot (HCC)   Pressure injury of skin   AICD (automatic cardioverter/defibrillator) present  S/P TAVR (transcatheter aortic valve replacement)   Urinary retention   Phimosis   Sepsis due to enterococcal bacteremia complicating enterococcal UTI and recent Foley catheter placement: Recent urine culture confirms Enterococcus sensitive to vancomycin and ampicillin.  Blood culture x2 and urine culture from 3/11 confirm Enterococcus faecalis.  Patient allergic to vancomycin.  This admission he received a dose of daptomycin x1 in ED, after communicating with ID he was then placed on linezolid and Zosyn.  Cautious with IV fluids on admission due to history of CHF.  Lactate normal.  Foley catheter was blocked and changed on admission by urology.  Based on automatic consultation for enterococcal bacteremia, ID seen and have narrowed antibiotics to IV ampicillin.  Given history of TAVR, follow TTE for vegetations and surveillance blood cultures from 3/13.  Acute urinary retention/fungal balanitis/phimosis: Urology consultation and follow-up appreciated.  Foley catheter changed this admission on 3/12.  Recommending fluconazole for balanitis.  Also recommending holding anticoagulation for an additional day due to hematuria.  S/p recent left TMA for osteomyelitis: Underwent TMA by Dr. Sharol Given on 08/29/2019. Dr. Farley Ly input appreciated.  Now has a stump shrinker.  No abscess and x-ray without osteomyelitis.  At this time after reviewing Ortho input and discussing with ID, this is not felt to be a source of infection or cellulitis.  Chronic combined systolic and diastolic CHF/PPM/AICD/TAVR: Follows with Dr. Radford Pax, cardiology at [facility]. LVEF in 2019 40-45%. S/p PPM and AICD. Holding [place], Imdur and beta-blockers due to initial hypotension.  Will resume beta-blockers now that his blood pressures are better and at times even high.  I discussed with neurologist at bedside  this morning and they recommended holding of anticoagulation for another 24 hours.  Essential hypertension: Resume  beta-blockers.  Hold the rest for now.  Paroxysmal atrial fibrillation: Currently in sinus rhythm.  Restart beta-blockers. Apixaban on hold since 09/09/2019 for upcoming urology procedure next week.  Anticoagulation discussion as above  Anemia: Suspect chronic disease. Stable.  Thrombocytopenia: Due to acute infection.  Slowly drifting down.  Follow CBCs daily.  Type II DM with peripheral neuropathy: Continue SSI and adjust insulins as needed. Continue gabapentin for significant neuropathy pain.  Reasonable inpatient control.  CAD s/p PCI: No anginal symptoms. Aspirin on hold due to some hematuria.  Body mass index is 44.91 kg/m. Morbid obesity  Mild transaminitis: Unclear etiology.  Slightly worse today.  No GI symptoms.  May be related to sepsis.  If worsen then may consider RUQ ultrasound.  Follow CMP in a.m.  OSA on CPAP: Continue.  ??  Hallucinations: History unclear.  Monitor closely  Bilateral lower extremity chronic venous stasis  Hypokalemia: Replace and follow.   DVT prophylaxis: SCDs.  Code Status: Full Family Communication: Discussed in detail with patient spouse at bedside on 3/12.  None at bedside today.  Unable to reach spouse via phone, left VM message. Disposition:  . Patient came from: Home.           . Anticipated d/c place: Home . Barriers to d/c: Sepsis   Consultants:   Urology Orthopedics ID  Procedures:   Has indwelling Foley catheter, changed by urology on 3/12  Antimicrobials:   Zosyn, linezolid-discontinued.  Daptomycin x1 dose. IV ampicillin.   Subjective:  Patient states that physically he feels much better.  Chronic low back pain without worsening.  No pain in legs reported.  Denies dyspnea or chest pain.  Indicates that he may have had some hallucinations versus dreaming, not sure.  As per RN, no acute issues noted.  Objective:   Vitals:   09/13/19 0700 09/13/19 0800 09/13/19 0900 09/13/19 1200  BP: (!) 125/28 (!) 121/35 (!) 156/72    Pulse:  80 91   Resp: 19 13 18    Temp:  97.6 F (36.4 C)  98.2 F (36.8 C)  TempSrc:  Axillary  Oral  SpO2:  100% 100%   Weight:      Height:        General exam: Elderly male, moderately built and morbidly obese lying comfortably propped up in bed with nasal CPAP on.  Looks much better than he did yesterday. Respiratory system: Clear to auscultation.  No increased work of breathing. Cardiovascular system: S1 & S2 heard, RRR. No JVD, murmurs, rubs, gallops or clicks. Trace bilateral ankle edema: Telemetry personally reviewed: Sinus rhythm/V paced. Gastrointestinal system: Abdomen is nondistended/obese, soft and nontender. No organomegaly or masses felt. Normal bowel sounds heard. GU: As examined on 3/12: Mild erythema around his scrotal sac and penis which is difficult to visualize, buried in his scrotum.  Now has new Foley catheter with dark urine in bag.  Central nervous system: Alert and oriented. No focal neurological deficits. Extremities: Symmetric 5 x 5 power. Trace bilateral leg edema. Chronic scarring and hyperpigmentation of bilateral lower legs consistent with chronic venous stasis. Left TMA site now has a stump shrinker Skin: No rashes, lesions or ulcers Psychiatry: Judgement and insight appear normal. Mood & affect appropriate.     Data Reviewed:   I have personally reviewed following labs and imaging studies   CBC: Recent Labs  Lab 09/11/19 2343 09/12/19 0454 09/13/19 9201  WBC 13.9* 12.0* 5.9  NEUTROABS 11.6* 10.0*  --   HGB 10.8* 10.1* 10.1*  HCT 33.9* 31.3* 30.9*  MCV 95.8 95.1 93.4  PLT 127* 117* 109*    Basic Metabolic Panel: Recent Labs  Lab 09/11/19 2343 09/12/19 0454 09/13/19 0633  NA 138 138 138  K 4.2 3.7 3.3*  CL 102 103 104  CO2 27 24 25   GLUCOSE 146* 141* 108*  BUN 19 19 15   CREATININE 1.08 1.02 0.80  CALCIUM 8.5* 8.2* 8.4*    Liver Function Tests: Recent Labs  Lab 09/11/19 2343 09/12/19 0454 09/13/19 0633  AST 127* 110* 268*   ALT 101* 90* 183*  ALKPHOS 133* 120 181*  BILITOT 1.0 1.0 1.0  PROT 6.3* 5.8* 5.7*  ALBUMIN 3.0* 2.8* 2.9*    CBG: Recent Labs  Lab 09/12/19 2157 09/13/19 0737 09/13/19 1122  GLUCAP 106* 93 109*    Microbiology Studies:   Recent Results (from the past 240 hour(s))  Urine culture     Status: Abnormal   Collection Time: 09/09/19  5:32 PM   Specimen: Urine, Clean Catch  Result Value Ref Range Status   Specimen Description URINE, CLEAN CATCH  Final   Special Requests   Final    NONE Performed at [facility], [place] [address], [place], [place] [address]    Culture (A)  Final    >=100,000 COLONIES/mL ENTEROCOCCUS FAECALIS >=100,000 COLONIES/mL AEROCOCCUS URINAE    Report Status 09/12/2019 FINAL  Final   Organism ID, Bacteria ENTEROCOCCUS FAECALIS (A)  Final      Susceptibility   Enterococcus faecalis - MIC*    AMPICILLIN <=2 SENSITIVE Sensitive     NITROFURANTOIN <=16 SENSITIVE Sensitive     VANCOMYCIN 1 SENSITIVE Sensitive     * >=100,000 COLONIES/mL ENTEROCOCCUS FAECALIS  Blood Culture (routine x 2)     Status: Abnormal (Preliminary result)   Collection Time: 09/11/19 11:38 PM   Specimen: BLOOD  Result Value Ref Range Status   Specimen Description   Final    BLOOD RIGHT ANTECUBITAL Performed at [facility] [address], [place], [place] [address]    Special Requests   Final    BOTTLES DRAWN AEROBIC AND ANAEROBIC Blood Culture adequate volume Performed at [place] [address] [place], [place] [address]    Culture  Setup Time   Final    IN BOTH AEROBIC AND ANAEROBIC BOTTLES GRAM POSITIVE COCCI IN CHAINS CRITICAL RESULT CALLED TO, READ BACK BY AND VERIFIED WITHGuadlupe [name] 09/12/19 1712 JDW Performed at [facility], [address] [place], [place] [address]    Culture ENTEROCOCCUS FAECALIS (A)  Final   Report Status PENDING  Incomplete  Urine culture     Status: Abnormal   Collection Time:  09/11/19 11:38 PM   Specimen: In/Out Cath Urine  Result Value Ref Range Status   Specimen Description   Final    IN/OUT CATH URINE Performed at [facility] [place] [address], [place], [place] [address]    Special Requests   Final    NONE Performed at [name], [place] [address] [place], [place] [address]    Culture >=100,000 COLONIES/mL ENTEROCOCCUS FAECALIS (A)  Final   Report Status 09/13/2019 FINAL  Final   Organism ID, Bacteria ENTEROCOCCUS FAECALIS (A)  Final      Susceptibility   Enterococcus faecalis - MIC*    AMPICILLIN <=2 SENSITIVE Sensitive  NITROFURANTOIN <=16 SENSITIVE Sensitive     VANCOMYCIN 1 SENSITIVE Sensitive     * >=100,000 COLONIES/mL ENTEROCOCCUS FAECALIS  Blood Culture (routine x 2)     Status: Abnormal (Preliminary result)   Collection Time: 09/11/19 11:43 PM   Specimen: BLOOD RIGHT FOREARM  Result Value Ref Range Status   Specimen Description   Final    BLOOD RIGHT FOREARM Performed at [facility], [place] [address], [place], [place] [address]    Special Requests   Final    BOTTLES DRAWN AEROBIC ONLY Blood Culture results may not be optimal due to an inadequate volume of blood received in culture bottles Performed at [facility] [address], [place], [place] [address]    Culture  Setup Time   Final    AEROBIC BOTTLE ONLY GRAM POSITIVE COCCI IN CHAINS CRITICAL RESULT CALLED TO, READ BACK BY AND VERIFIED WITHGuadlupe [facility] 09/12/19 2100 Performed at [facility], [place] [address], [place], [place] [address]    Culture ENTEROCOCCUS FAECALIS (A)  Final   Report Status PENDING  Incomplete  Blood Culture ID Panel (Reflexed)     Status: Abnormal   Collection Time: 09/11/19 11:43 PM  Result Value Ref Range Status   Enterococcus species DETECTED (A) NOT DETECTED Final    Comment: CRITICAL RESULT CALLED TO, READ BACK BY AND VERIFIED WITH: N GLOGOVAC PHARMD 09/12/19 2100 JDW     Vancomycin resistance NOT DETECTED NOT DETECTED Final   Listeria monocytogenes NOT DETECTED NOT DETECTED Final   Staphylococcus species NOT DETECTED NOT DETECTED Final   Staphylococcus aureus (BCID) NOT DETECTED NOT DETECTED Final   Streptococcus species NOT DETECTED NOT DETECTED Final   Streptococcus agalactiae NOT DETECTED NOT DETECTED Final   Streptococcus pneumoniae NOT DETECTED NOT DETECTED Final   Streptococcus pyogenes NOT DETECTED NOT DETECTED Final   Acinetobacter baumannii NOT DETECTED NOT DETECTED Final   Enterobacteriaceae species NOT DETECTED NOT DETECTED Final   Enterobacter cloacae complex NOT DETECTED NOT DETECTED Final   Escherichia coli NOT DETECTED NOT DETECTED Final   Klebsiella oxytoca NOT DETECTED NOT DETECTED Final   Klebsiella pneumoniae NOT DETECTED NOT DETECTED Final   Proteus species NOT DETECTED NOT DETECTED Final   Serratia marcescens NOT DETECTED NOT DETECTED Final   Haemophilus influenzae NOT DETECTED NOT DETECTED Final   Neisseria meningitidis NOT DETECTED NOT DETECTED Final   Pseudomonas aeruginosa NOT DETECTED NOT DETECTED Final   Candida albicans NOT DETECTED NOT DETECTED Final   Candida glabrata NOT DETECTED NOT DETECTED Final   Candida krusei NOT DETECTED NOT DETECTED Final   Candida parapsilosis NOT DETECTED NOT DETECTED Final   Candida tropicalis NOT DETECTED NOT DETECTED Final    Comment: Performed at [facility], [place]. [address] [place], [place] [address]  SARS CORONAVIRUS 2 (TAT 6-24 HRS) Nasopharyngeal Nasopharyngeal Swab     Status: None   Collection Time: 09/12/19  1:44 AM   Specimen: Nasopharyngeal Swab  Result Value Ref Range Status   SARS Coronavirus 2 NEGATIVE NEGATIVE Final    Comment: (NOTE) SARS-CoV-2 target nucleic acids are NOT DETECTED. The SARS-CoV-2 RNA is generally detectable in upper and lower respiratory specimens during the acute phase of infection. Negative results do not preclude SARS-CoV-2 infection, do not rule  out co-infections with other pathogens, and should not be used as the sole basis for treatment or other patient management decisions. Negative results must be combined with clinical observations, patient history, and epidemiological information.  The expected result is Negative. Fact Sheet for Patients: SugarRoll.be Fact Sheet for Healthcare Providers: https://www.woods-mathews.com/ This test is not yet approved or cleared by the [place] FDA and  has been authorized for detection and/or diagnosis of SARS-CoV-2 by FDA under an Emergency Use Authorization (EUA). This EUA will remain  in effect (meaning this test can be used) for the duration of the COVID-19 declaration under Section 56 4(b)(1) of the Act, 21 U.S.C. section 360bbb-3(b)(1), unless the authorization is terminated or revoked sooner. Performed at [facility], [place] [address], [place], [place] [address]   MRSA PCR Screening     Status: None   Collection Time: 09/12/19 10:03 PM   Specimen: Nasal Mucosa; Nasopharyngeal  Result Value Ref Range Status   MRSA by PCR NEGATIVE NEGATIVE Final    Comment:        The GeneXpert MRSA Assay (FDA approved for NASAL specimens only), is one component of a comprehensive MRSA colonization surveillance program. It is not intended to diagnose MRSA infection nor to guide or monitor treatment for MRSA infections. Performed at [facility], [place] [address] [place], [place] [address]      Radiology Studies:  No results found.   Scheduled Meds:   . allopurinol  300 mg Oral Daily  . chlorhexidine  15 mL Mouth Rinse BID  . Chlorhexidine Gluconate Cloth  6 each Topical Daily  . cyclobenzaprine  5 mg Oral QHS  . ezetimibe  10 mg Oral Daily  . finasteride  5 mg Oral Daily  . gabapentin  800 mg Oral QID  . insulin aspart  0-9 Units Subcutaneous TID WC  . mouth rinse  15 mL Mouth Rinse q12n4p  . niacin  1,000 mg  Oral BID  . pantoprazole  40 mg Oral Daily  . rosuvastatin  40 mg Oral Daily    Continuous Infusions:   . ampicillin (OMNIPEN) IV Stopped (09/13/19 1308)     LOS: 1 day     Vernell Leep, MD, [place], [facility]. [facility]    To contact the attending provider between 7A-7P or the covering provider during after hours 7P-7A, please log into the web site [email or web address] and access using universal [place] password for that web site. If you do not have the password, please call the hospital operator.  09/13/2019, 3:29 PM

## 2019-09-13 NOTE — Progress Notes (Signed)
Echocardiogram 2D Echocardiogram has been performed.  Oneal Deputy Ronie Fleeger 09/13/2019, 4:02 PM

## 2019-09-14 LAB — GLUCOSE, CAPILLARY
Glucose-Capillary: 102 mg/dL — ABNORMAL HIGH (ref 70–99)
Glucose-Capillary: 142 mg/dL — ABNORMAL HIGH (ref 70–99)
Glucose-Capillary: 125 mg/dL — ABNORMAL HIGH (ref 70–99)

## 2019-09-14 LAB — COMPREHENSIVE METABOLIC PANEL
ALT: 153 U/L — ABNORMAL HIGH (ref 0–44)
AST: 178 U/L — ABNORMAL HIGH (ref 15–41)
Albumin: 2.4 g/dL — ABNORMAL LOW (ref 3.5–5.0)
Alkaline Phosphatase: 137 U/L — ABNORMAL HIGH (ref 38–126)
Anion gap: 8 (ref 5–15)
BUN: 16 mg/dL (ref 8–23)
CO2: 26 mmol/L (ref 22–32)
Calcium: 8.1 mg/dL — ABNORMAL LOW (ref 8.9–10.3)
Chloride: 105 mmol/L (ref 98–111)
Creatinine, Ser: 0.8 mg/dL (ref 0.61–1.24)
GFR calc Af Amer: >60 mL/min (ref >60)
GFR calc non Af Amer: >60 mL/min (ref >60)
Glucose, Bld: 110 mg/dL — ABNORMAL HIGH (ref 70–99)
Potassium: 4.1 mmol/L (ref 3.5–5.1)
Sodium: 139 mmol/L (ref 135–145)
Total Bilirubin: 1.1 mg/dL (ref 0.3–1.2)
Total Protein: 5.3 g/dL — ABNORMAL LOW (ref 6.5–8.1)

## 2019-09-14 LAB — CBC
HCT: 28.8 % — ABNORMAL LOW (ref 39.0–52.0)
Hemoglobin: 9.8 g/dL — ABNORMAL LOW (ref 13.0–17.0)
MCH: 30.6 pg (ref 26.0–34.0)
MCHC: 34 g/dL (ref 30.0–36.0)
MCV: 90 fL (ref 80.0–100.0)
Platelets: 86 10*3/uL — ABNORMAL LOW (ref 150–400)
RBC: 3.2 MIL/uL — ABNORMAL LOW (ref 4.22–5.81)
RDW: 14.2 % (ref 11.5–15.5)
WBC: 6 10*3/uL (ref 4.0–10.5)
nRBC: 0 % (ref 0.0–0.2)

## 2019-09-14 MED ORDER — ISOSORBIDE MONONITRATE ER 60 MG PO TB24
60.0000 mg | ORAL_TABLET | Freq: Every day | ORAL | Status: DC
  Administered 2019-09-14 – 2019-09-17 (×4): 60 mg via ORAL
  Filled 2019-09-14 (×4): qty 1

## 2019-09-14 MED ORDER — APIXABAN 5 MG PO TABS
5.0000 mg | ORAL_TABLET | Freq: Two times a day (BID) | ORAL | Status: DC
  Administered 2019-09-14 – 2019-09-17 (×7): 5 mg via ORAL
  Filled 2019-09-14 (×7): qty 1

## 2019-09-14 MED ORDER — SACUBITRIL-VALSARTAN 24-26 MG PO TABS
1.0000 | ORAL_TABLET | Freq: Two times a day (BID) | ORAL | Status: DC
  Administered 2019-09-14 – 2019-09-17 (×7): 1 via ORAL
  Filled 2019-09-14 (×8): qty 1

## 2019-09-14 MED ORDER — ASPIRIN 81 MG PO TBEC
81.0000 mg | DELAYED_RELEASE_TABLET | Freq: Every day | ORAL | Status: DC
  Administered 2019-09-14: 81 mg via ORAL
  Filled 2019-09-14 (×3): qty 1

## 2019-09-14 NOTE — Progress Notes (Addendum)
PROGRESS NOTE   Tim [name]  QIH:[id number]    DOB: 27-Oct-1940    DOA: 09/11/2019  PCP: Tammi Sou, MD   I have briefly reviewed patients previous medical records in [facility].  Chief Complaint:   Chief Complaint  Patient presents with  . Code Sepsis    Brief Narrative:  79 year old married male, ambulates with the help of a cane, extensive PMH including CAD status post PCI, cardiomyopathy, chronic combined CHF, TAVR, A. fib on Eliquis currently on hold for upcoming procedure, PPM/AICD, follows with cardiologist with [facility], type II DM with peripheral neuropathy, chronic pain for which he sees pain MD, OSA on nightly CPAP, COPD/asthma not on home oxygen, HTN, GERD, HLD, s/p left transmetatarsal amputation for osteomyelitis by Dr. Sharol Given on 08/29/2019, seen in ED on 09/09/2019 for decreased urination and pain, seen by Dr. Tresa Moore, urology, diagnosed with balanitis, urinary catheter placed and supposed to have a procedure next week presented to the ED with complaints of high fevers (104 F), chills and confusion. Admitted for sepsis due to enterococcal bacteremia complicating enterococcal UTI, fungal balanitis. Treated with IV fluids and empiric antibiotics.  Orthopedics, urology and ID consulted.  Improved.  Transferred from stepdown to telemetry on 3/13.  Continues to improve.  Assessment & Plan:  Principal Problem:   Enterococcal bacteremia Active Problems:   Obesity   OBSTRUCTIVE SLEEP APNEA   Essential hypertension, benign   Venous insufficiency of both lower extremities   COPD (chronic obstructive pulmonary disease) (HCC)   CAD (coronary artery disease), native coronary artery   Paroxysmal atrial fibrillation (HCC)   Moderate aortic stenosis   Diabetic neuropathy, painful (HCC)   Sepsis ([place])   Acute lower UTI   History of transmetatarsal amputation of left foot (HCC)   Pressure injury of skin   AICD (automatic cardioverter/defibrillator) present   S/P TAVR  (transcatheter aortic valve replacement)   Urinary retention   Phimosis   Sepsis due to enterococcal bacteremia complicating enterococcal UTI and recent Foley catheter placement: Recent urine culture confirms Enterococcus sensitive to vancomycin and ampicillin.  Blood culture x2 and urine culture from 3/11 confirm Enterococcus faecalis.  Patient allergic to vancomycin.  This admission he received a dose of daptomycin x1 in ED, after communicating with ID he was then placed on linezolid and Zosyn.  Cautious with IV fluids on admission due to history of CHF.  Lactate normal.  Foley catheter was blocked and changed on admission by urology.  Based on automatic consultation for enterococcal bacteremia, ID seen and have narrowed antibiotics to IV ampicillin.  Given history of TAVR, follow TTE for vegetations and surveillance blood cultures from 3/13.  TTE: Poor quality to detect vegetations, recommended TEE-await ID follow-up if we need to do this versus treat empirically.  Low-grade fever/100 F overnight.  Surveillance blood cultures x2 from 3/13: Pending.  Sepsis physiology resolved.  Addendum: I discussed in detail with Dr. Megan Salon, ID on 3/14.  Given that patient has TAVR and AICD, he recommends proceeding with TEE to rule out vegetations.  Cardiology consulted.  Updated patient and spouse  Acute urinary retention/fungal balanitis/phimosis: Urology consultation and follow-up appreciated.  Foley catheter changed this admission on 3/12.  Recommending fluconazole for balanitis.  Anticoagulation was held until 3/14 due to hematuria and possible need for instrumentation.  As per urology follow-up today, okay to resume anticoagulation.  No gross hematuria in bag.  Outpatient follow-up with Dr. Tresa Moore, Urology.  Continue Proscar and Oxybutynin.  S/p recent left  TMA for osteomyelitis: Underwent TMA by Dr. Sharol Given on 08/29/2019. Dr. Farley Ly input appreciated.  Now has a stump shrinker.  No abscess and x-ray  without osteomyelitis.  At this time after reviewing Ortho input and discussing with ID, this is not felt to be a source of infection or cellulitis.  Chronic combined systolic and diastolic CHF/PPM/AICD/TAVR: Follows with Dr. Radford Pax, Cardiology at [facility]. LVEF in 2019 40-45%. S/p PPM and AICD.  Held [place], Imdur and beta-blockers due to initial hypotension.  Beta-blockers resumed yesterday.  Blood pressures okay, resume prior home dose of Entresto and Imdur.  Essential hypertension: Continue beta-blockers, Entresto and Imdur.  Paroxysmal atrial fibrillation: Currently in sinus rhythm.  Apixaban was on hold since 3/9.  As discussed with Urology, resumed apixaban 3/14.  Continue beta-blockers.  Anemia: Suspect chronic disease. Stable.  Thrombocytopenia: Due to acute infection.  Slowly drifting down.  Continue to follow CBC daily.  Type II DM with peripheral neuropathy: Continue SSI and adjust insulins as needed. Continue gabapentin for significant neuropathy pain.  Reasonable inpatient control.  CAD s/p PCI: No anginal symptoms. Aspirin was on hold due to hematuria, now that gross hematuria has resolved, resume.  Body mass index is 44.91 kg/m. Morbid obesity  Mild transaminitis: Unclear etiology.  Slightly worse today.  No GI symptoms.  May be related to sepsis.  If worsen then may consider RUQ ultrasound.  LFTs slightly better, continue to trend.  OSA on CPAP: Continue.  ??  Hallucinations: History unclear.  Monitor closely.  Seems to have resolved.  Bilateral lower extremity chronic venous stasis  Hypokalemia: Replaced.  Dyslipidemia: Continue statins and Niaspan.   DVT prophylaxis: SCDs.  Apixaban. Code Status: Full Family Communication: Discussed in detail with patient spouse at bedside on 3/14.  None at bedside today.  Unable to reach spouse via phone, left VM message. Disposition:  . Patient came from: Home.           . Anticipated d/c place: Home.  Requested PT  evaluation. . Barriers to d/c: Sepsis, improving.  Possible discharge in the next 48 hours.   Consultants:   Urology Orthopedics ID  Procedures:   Has indwelling Foley catheter, changed by urology on 3/12  Antimicrobials:   Zosyn, linezolid-discontinued.  Daptomycin x1 dose. IV ampicillin.   Subjective:  Feels better overall.  Low-grade fever overnight.  Has not been out of bed and wishes to get out of bed.  Denies pain, dyspnea, chest pain.  As per RN, no acute issues noted.  Objective:   Vitals:   09/13/19 1737 09/13/19 2029 09/13/19 2315 09/14/19 0515  BP: (!) 145/58 131/63  112/80  Pulse: 81 80 82 79  Resp:  18  18  Temp: 99 F (37.2 C) 100 F (37.8 C)  98.9 F (37.2 C)  TempSrc: Oral Oral  Oral  SpO2: 98% 96% 96% 98%  Weight:      Height:        General exam: Elderly male, moderately built and morbidly obese lying comfortably propped up in bed without distress.  Looks much improved compared to 48 hours ago. Respiratory system: Clear to auscultation.  No increased work of breathing. Cardiovascular system: S1 and S2 heard, RRR.  No JVD, murmurs or gallops.  Trace bilateral leg edema.  Telemetry personally reviewed: Appears to be in atrial sensed, V paced rhythm. Gastrointestinal system: Abdomen is nondistended/obese, soft and nontender. No organomegaly or masses felt. Normal bowel sounds heard. GU: As examined on 3/12: Mild erythema around his  scrotal sac and penis which is difficult to visualize, buried in his scrotum.  Now has new Foley catheter with clear straw-colored urine in the bag without gross hematuria. Central nervous system: Alert and oriented. No focal neurological deficits. Extremities: Symmetric 5 x 5 power. Trace bilateral leg edema. Chronic scarring and hyperpigmentation of bilateral lower legs consistent with chronic venous stasis. Left TMA site now has a stump shrinker Skin: No rashes, lesions or ulcers Psychiatry: Judgement and insight appear  normal. Mood & affect appropriate.     Data Reviewed:   I have personally reviewed following labs and imaging studies   CBC: Recent Labs  Lab 09/11/19 2343 09/11/19 2343 09/12/19 0454 09/13/19 0633 09/14/19 0529  WBC 13.9*   < > 12.0* 5.9 6.0  NEUTROABS 11.6*  --  10.0*  --   --   HGB 10.8*   < > 10.1* 10.1* 9.8*  HCT 33.9*   < > 31.3* 30.9* 28.8*  MCV 95.8   < > 95.1 93.4 90.0  PLT 127*   < > 117* 109* 86*   < > = values in this interval not displayed.    Basic Metabolic Panel: Recent Labs  Lab 09/12/19 0454 09/13/19 0633 09/14/19 0529  NA 138 138 139  K 3.7 3.3* 4.1  CL 103 104 105  CO2 24 25 26   GLUCOSE 141* 108* 110*  BUN 19 15 16   CREATININE 1.02 0.80 0.80  CALCIUM 8.2* 8.4* 8.1*    Liver Function Tests: Recent Labs  Lab 09/12/19 0454 09/13/19 0633 09/14/19 0529  AST 110* 268* 178*  ALT 90* 183* 153*  ALKPHOS 120 181* 137*  BILITOT 1.0 1.0 1.1  PROT 5.8* 5.7* 5.3*  ALBUMIN 2.8* 2.9* 2.4*    CBG: Recent Labs  Lab 09/13/19 2031 09/14/19 0729 09/14/19 1110  GLUCAP 122* 102* 142*    Microbiology Studies:   Recent Results (from the past 240 hour(s))  Urine culture     Status: Abnormal   Collection Time: 09/09/19  5:32 PM   Specimen: Urine, Clean Catch  Result Value Ref Range Status   Specimen Description URINE, CLEAN CATCH  Final   Special Requests   Final    NONE Performed at [facility], [place] [address] [place], [place] [address]    Culture (A)  Final    >=100,000 COLONIES/mL ENTEROCOCCUS FAECALIS >=100,000 COLONIES/mL AEROCOCCUS URINAE    Report Status 09/12/2019 FINAL  Final   Organism ID, Bacteria ENTEROCOCCUS FAECALIS (A)  Final      Susceptibility   Enterococcus faecalis - MIC*    AMPICILLIN <=2 SENSITIVE Sensitive     NITROFURANTOIN <=16 SENSITIVE Sensitive     VANCOMYCIN 1 SENSITIVE Sensitive     * >=100,000 COLONIES/mL ENTEROCOCCUS FAECALIS  Blood Culture (routine x 2)     Status: Abnormal (Preliminary result)    Collection Time: 09/11/19 11:38 PM   Specimen: BLOOD  Result Value Ref Range Status   Specimen Description   Final    BLOOD RIGHT ANTECUBITAL Performed at [place] [address], [place], [place] [address]    Special Requests   Final    BOTTLES DRAWN AEROBIC AND ANAEROBIC Blood Culture adequate volume Performed at [place] [address] [place], [place] [address]    Culture  Setup Time   Final    IN BOTH AEROBIC AND ANAEROBIC BOTTLES GRAM POSITIVE COCCI IN CHAINS CRITICAL RESULT CALLED TO, READ BACK BY AND VERIFIED WITH: N GLOGOVAC  [facility] 09/12/19 1712 JDW Performed at [place] [address] [place], [place] [address]    Culture ENTEROCOCCUS FAECALIS (A)  Final   Report Status PENDING  Incomplete  Urine culture     Status: Abnormal   Collection Time: 09/11/19 11:38 PM   Specimen: In/Out Cath Urine  Result Value Ref Range Status   Specimen Description   Final    IN/OUT CATH URINE Performed at [place] [address], [place], [place] [address]    Special Requests   Final    NONE Performed at [name], [place] [address] [place], [place] [address]    Culture >=100,000 COLONIES/mL ENTEROCOCCUS FAECALIS (A)  Final   Report Status 09/13/2019 FINAL  Final   Organism ID, Bacteria ENTEROCOCCUS FAECALIS (A)  Final      Susceptibility   Enterococcus faecalis - MIC*    AMPICILLIN <=2 SENSITIVE Sensitive     NITROFURANTOIN <=16 SENSITIVE Sensitive     VANCOMYCIN 1 SENSITIVE Sensitive     * >=100,000 COLONIES/mL ENTEROCOCCUS FAECALIS  Blood Culture (routine x 2)     Status: Abnormal (Preliminary result)   Collection Time: 09/11/19 11:43 PM   Specimen: BLOOD RIGHT FOREARM  Result Value Ref Range Status   Specimen Description   Final    BLOOD RIGHT FOREARM Performed at [facility], [place] [address] [place], [place] [address]    Special Requests   Final    BOTTLES DRAWN  AEROBIC ONLY Blood Culture results may not be optimal due to an inadequate volume of blood received in culture bottles Performed at [place] [address] [place], [place] [address]    Culture  Setup Time   Final    AEROBIC BOTTLE ONLY GRAM POSITIVE COCCI IN CHAINS CRITICAL RESULT CALLED TO, READ BACK BY AND VERIFIED WITHGuadlupe [name] 09/12/19 2100 Performed at [facility], [place] [address], [place], [place] [address]    Culture ENTEROCOCCUS FAECALIS (A)  Final   Report Status PENDING  Incomplete  Blood Culture ID Panel (Reflexed)     Status: Abnormal   Collection Time: 09/11/19 11:43 PM  Result Value Ref Range Status   Enterococcus species DETECTED (A) NOT DETECTED Final    Comment: CRITICAL RESULT CALLED TO, READ BACK BY AND VERIFIED WITH: N GLOGOVAC PHARMD 09/12/19 2100 JDW    Vancomycin resistance NOT DETECTED NOT DETECTED Final   Listeria monocytogenes NOT DETECTED NOT DETECTED Final   Staphylococcus species NOT DETECTED NOT DETECTED Final   Staphylococcus aureus (BCID) NOT DETECTED NOT DETECTED Final   Streptococcus species NOT DETECTED NOT DETECTED Final   Streptococcus agalactiae NOT DETECTED NOT DETECTED Final   Streptococcus pneumoniae NOT DETECTED NOT DETECTED Final   Streptococcus pyogenes NOT DETECTED NOT DETECTED Final   Acinetobacter baumannii NOT DETECTED NOT DETECTED Final   Enterobacteriaceae species NOT DETECTED NOT DETECTED Final   Enterobacter cloacae complex NOT DETECTED NOT DETECTED Final   Escherichia coli NOT DETECTED NOT DETECTED Final   Klebsiella oxytoca NOT DETECTED NOT DETECTED Final   Klebsiella pneumoniae NOT DETECTED NOT DETECTED Final   Proteus species NOT DETECTED NOT DETECTED Final   Serratia marcescens NOT DETECTED NOT DETECTED Final   Haemophilus influenzae NOT DETECTED NOT DETECTED Final   Neisseria meningitidis NOT DETECTED NOT DETECTED Final   Pseudomonas aeruginosa NOT DETECTED NOT DETECTED Final    Candida albicans NOT DETECTED NOT DETECTED Final   Candida glabrata NOT DETECTED  NOT DETECTED Final   Candida krusei NOT DETECTED NOT DETECTED Final   Candida parapsilosis NOT DETECTED NOT DETECTED Final   Candida tropicalis NOT DETECTED NOT DETECTED Final    Comment: Performed at [facility], [address], [place], [place] [address]  SARS CORONAVIRUS 2 (TAT 6-24 HRS) Nasopharyngeal Nasopharyngeal Swab     Status: None   Collection Time: 09/12/19  1:44 AM   Specimen: Nasopharyngeal Swab  Result Value Ref Range Status   SARS Coronavirus 2 NEGATIVE NEGATIVE Final    Comment: (NOTE) SARS-CoV-2 target nucleic acids are NOT DETECTED. The SARS-CoV-2 RNA is generally detectable in upper and lower respiratory specimens during the acute phase of infection. Negative results do not preclude SARS-CoV-2 infection, do not rule out co-infections with other pathogens, and should not be used as the sole basis for treatment or other patient management decisions. Negative results must be combined with clinical observations, patient history, and epidemiological information. The expected result is Negative. Fact Sheet for Patients: SugarRoll.be Fact Sheet for Healthcare Providers: https://www.woods-mathews.com/ This test is not yet approved or cleared by the [place] FDA and  has been authorized for detection and/or diagnosis of SARS-CoV-2 by FDA under an Emergency Use Authorization (EUA). This EUA will remain  in effect (meaning this test can be used) for the duration of the COVID-19 declaration under Section 56 4(b)(1) of the Act, 21 U.S.C. section 360bbb-3(b)(1), unless the authorization is terminated or revoked sooner. Performed at [facility], [place] [address] [place], [place] [address]   MRSA PCR Screening     Status: None   Collection Time: 09/12/19 10:03 PM   Specimen: Nasal Mucosa; Nasopharyngeal  Result Value Ref Range Status    MRSA by PCR NEGATIVE NEGATIVE Final    Comment:        The GeneXpert MRSA Assay (FDA approved for NASAL specimens only), is one component of a comprehensive MRSA colonization surveillance program. It is not intended to diagnose MRSA infection nor to guide or monitor treatment for MRSA infections. Performed at [name], [place] [address] [place], [place] [address]      Radiology Studies:  ECHOCARDIOGRAM COMPLETE  Result Date: 09/13/2019    ECHOCARDIOGRAM REPORT   Patient Name:   Tim [name] Date of Exam: 09/13/2019 Medical Rec #:  [id number]       Height:       72.0 in Accession #:    [id number]      Weight:       331.1 lb Date of Birth:  11/04/1940       BSA:          2.640 m Patient Age:    45 years        BP:           156/72 mmHg Patient Gender: M               HR:           76 bpm. Exam Location:  Inpatient Procedure: 2D Echo, Color Doppler, Cardiac Doppler and Intracardiac            Opacification Agent Indications:    Bacteremia R78.81  History:        Patient has prior history of Echocardiogram examinations, most                 recent 04/18/2018. CAD, Defibrillator, COPD, Arrythmias:Atrial  Fibrillation; Risk Factors:Sleep Apnea, Hypertension, Diabetes                 and Dyslipidemia. 16m Sapien S3 TAVR placed on 04/11/16.                 Aortic Valve: 29 mm Edwards Sapien prosthetic, stented (TAVR)                 valve is present in the aortic position. Procedure Date:                 04/11/16.  Sonographer:    ERaquel SarnaSenior RDCS Referring Phys: [name] CAMPBELL  Sonographer Comments: Technically difficult study due to poor echo windows, suboptimal parasternal window and suboptimal apical window. IMPRESSIONS  1. Image quality suboptimal for the detection of valvular vegetations. Recommend TEE if clinically indicated.  2. Left ventricular ejection fraction, by estimation, is 60 to 65%. The left ventricle has normal function. Left ventricular  endocardial border not optimally defined to evaluate regional wall motion. There is mild asymmetric left ventricular hypertrophy. Left ventricular diastolic parameters are consistent with Grade I diastolic dysfunction (impaired relaxation). Elevated left ventricular end-diastolic pressure.  3. Right ventricular systolic function was not well visualized. The right ventricular size is not well visualized. There is normal pulmonary artery systolic pressure.  4. The mitral valve is abnormal. Trivial mitral valve regurgitation. No evidence of mitral stenosis.  5. The aortic valve has been repaired/replaced. Aortic valve regurgitation is trivial. No aortic stenosis is present. There is a 29 mm Edwards Sapien prosthetic (TAVR) valve present in the aortic position. Procedure Date: 04/11/16. Aortic valve mean gradient measures 18.0 mmHg.  6. The inferior vena cava is normal in size with greater than 50% respiratory variability, suggesting right atrial pressure of 3 mmHg. FINDINGS  Left Ventricle: Left ventricular ejection fraction, by estimation, is 60 to 65%. The left ventricle has normal function. Left ventricular endocardial border not optimally defined to evaluate regional wall motion. The left ventricular internal cavity size was normal in size. There is mild asymmetric left ventricular hypertrophy. Left ventricular diastolic parameters are consistent with Grade I diastolic dysfunction (impaired relaxation). Elevated left ventricular end-diastolic pressure. Right Ventricle: The right ventricular size is not well visualized. Right vetricular wall thickness was not assessed. Right ventricular systolic function was not well visualized. There is normal pulmonary artery systolic pressure. The tricuspid regurgitant velocity is 1.94 m/s, and with an assumed right atrial pressure of 3 mmHg, the estimated right ventricular systolic pressure is 146.5mmHg. Left Atrium: Left atrial size was not well visualized. Right Atrium: Right  atrial size was not well visualized. Pericardium: There is no evidence of pericardial effusion. Mitral Valve: Mobile subvavular calcification not significantly changed from echocardiogram 01/27/2016 on side by side comparison. The mitral valve is abnormal. Normal mobility of the mitral valve leaflets. Moderate to severe mitral annular calcification.  Trivial mitral valve regurgitation. No evidence of mitral valve stenosis. MV peak gradient, 10.4 mmHg. The mean mitral valve gradient is 3.0 mmHg with average heart rate of 79 bpm. Tricuspid Valve: The tricuspid valve is not well visualized. Tricuspid valve regurgitation is not demonstrated. No evidence of tricuspid stenosis. Aortic Valve: The aortic valve has been repaired/replaced. Aortic valve regurgitation is trivial. No aortic stenosis is present. Aortic valve mean gradient measures 18.0 mmHg. Aortic valve peak gradient measures 30.0 mmHg. Aortic valve area, by VTI measures 1.12 cm. There is a 29 mm Edwards Sapien prosthetic, stented (TAVR) valve present in the  aortic position. Procedure Date: 04/11/16. Pulmonic Valve: The pulmonic valve was not well visualized. Pulmonic valve regurgitation is not visualized. No evidence of pulmonic stenosis. Aorta: The aortic root was not well visualized. Venous: The inferior vena cava is normal in size with greater than 50% respiratory variability, suggesting right atrial pressure of 3 mmHg. IAS/Shunts: No atrial level shunt detected by color flow Doppler.  LEFT VENTRICLE PLAX 2D LVIDd:         5.20 cm  Diastology LVIDs:         3.90 cm  LV e' lateral:   5.98 cm/s LV PW:         1.10 cm  LV E/e' lateral: 17.7 LV IVS:        0.80 cm  LV e' medial:    6.20 cm/s LVOT diam:     2.20 cm  LV E/e' medial:  17.1 LV SV:         56 LV SV Index:   21 LVOT Area:     3.80 cm  RIGHT VENTRICLE RV S prime:     10.80 cm/s TAPSE (M-mode): 2.0 cm LEFT ATRIUM            Index       RIGHT ATRIUM           Index LA diam:      4.20 cm  1.59 cm/m  RA  Area:     23.80 cm LA Vol (A4C): 141.0 ml 53.41 ml/m RA Volume:   73.50 ml  27.84 ml/m  AORTIC VALVE AV Area (Vmax):    1.10 cm AV Area (Vmean):   1.12 cm AV Area (VTI):     1.12 cm AV Vmax:           274.00 cm/s AV Vmean:          203.000 cm/s AV VTI:            0.500 m AV Peak Grad:      30.0 mmHg AV Mean Grad:      18.0 mmHg LVOT Vmax:         79.20 cm/s LVOT Vmean:        60.000 cm/s LVOT VTI:          0.147 m LVOT/AV VTI ratio: 0.29 MITRAL VALVE                TRICUSPID VALVE MV Area (PHT): 2.73 cm     TR Peak grad:   15.1 mmHg MV Peak grad:  10.4 mmHg    TR Vmax:        194.00 cm/s MV Mean grad:  3.0 mmHg MV Vmax:       1.61 m/s     SHUNTS MV Vmean:      104.0 cm/s   Systemic VTI:  0.15 m MV Decel Time: 278 msec     Systemic Diam: 2.20 cm MV E velocity: 106.00 cm/s MV A velocity: 149.00 cm/s MV E/A ratio:  0.71 Cherlynn Kaiser MD Electronically signed by Cherlynn Kaiser MD Signature Date/Time: 09/13/2019/9:18:49 PM    Final      Scheduled Meds:   . allopurinol  300 mg Oral Daily  . chlorhexidine  15 mL Mouth Rinse BID  . Chlorhexidine Gluconate Cloth  6 each Topical Daily  . cyclobenzaprine  5 mg Oral QHS  . ezetimibe  10 mg Oral Daily  . finasteride  5 mg Oral Daily  . fluconazole  100 mg Oral Daily  .  gabapentin  800 mg Oral QID  . insulin aspart  0-9 Units Subcutaneous TID WC  . mouth rinse  15 mL Mouth Rinse q12n4p  . metoprolol succinate  50 mg Oral Daily  . niacin  1,000 mg Oral BID  . pantoprazole  40 mg Oral Daily  . rosuvastatin  40 mg Oral Daily    Continuous Infusions:   . ampicillin (OMNIPEN) IV 2 g (09/14/19 1151)     LOS: 2 days     Vernell Leep, MD, [place], [facility]. [facility]    To contact the attending provider between 7A-7P or the covering provider during after hours 7P-7A, please log into the web site [email or web address] and access using universal [place] password for that web site. If you do not have the password, please call the hospital  operator.  09/14/2019, 12:08 PM

## 2019-09-14 NOTE — Progress Notes (Signed)
Urology Inpatient Progress Report  Infection [B99.9] Cellulitis of foot [A57.903] Hematuria, gross [R31.0] SIRS (systemic inflammatory response syndrome) (HCC) [R65.10] Enterococcus UTI [N39.0, B95.2] Sepsis ([place]) [A41.9]        Intv/Subj: No acute events overnight.  Patient feeling significantly better.  No issues with catheter overnight.  Principal Problem:   Enterococcal bacteremia Active Problems:   Obesity   OBSTRUCTIVE SLEEP APNEA   Essential hypertension, benign   Venous insufficiency of both lower extremities   COPD (chronic obstructive pulmonary disease) (HCC)   CAD (coronary artery disease), native coronary artery   Paroxysmal atrial fibrillation (HCC)   Moderate aortic stenosis   Diabetic neuropathy, painful (HCC)   Sepsis ([place])   Acute lower UTI   History of transmetatarsal amputation of left foot (HCC)   Pressure injury of skin   AICD (automatic cardioverter/defibrillator) present   S/P TAVR (transcatheter aortic valve replacement)   Urinary retention   Phimosis  Current Facility-Administered Medications  Medication Dose Route Frequency Provider Last Rate Last Admin  . acetaminophen (TYLENOL) tablet 650 mg  650 mg Oral Q6H PRN Modena Jansky, MD   650 mg at 09/13/19 8333   Or  . acetaminophen (TYLENOL) suppository 650 mg  650 mg Rectal Q6H PRN Hongalgi, Anand D, MD      . albuterol (PROVENTIL) (2.5 MG/3ML) 0.083% nebulizer solution 2.5 mg  2.5 mg Nebulization Q6H PRN Hongalgi, Anand D, MD      . allopurinol (ZYLOPRIM) tablet 300 mg  300 mg Oral Daily Vernell Leep D, MD   300 mg at 09/13/19 0930  . ampicillin (OMNIPEN) 2 g in sodium chloride 0.9 % 100 mL IVPB  2 g Intravenous Q4H Modena Jansky, MD 300 mL/hr at 09/14/19 0804 2 g at 09/14/19 0804  . chlorhexidine (PERIDEX) 0.12 % solution 15 mL  15 mL Mouth Rinse BID Modena Jansky, MD   15 mL at 09/13/19 2132  . Chlorhexidine Gluconate Cloth 2 % PADS 6 each  6 each Topical Daily Modena Jansky, MD   6 each at 09/13/19 0932  . cyclobenzaprine (FLEXERIL) tablet 5 mg  5 mg Oral QHS Modena Jansky, MD   5 mg at 09/13/19 2132  . ezetimibe (ZETIA) tablet 10 mg  10 mg Oral Daily Modena Jansky, MD   10 mg at 09/13/19 0930  . finasteride (PROSCAR) tablet 5 mg  5 mg Oral Daily Modena Jansky, MD   5 mg at 09/13/19 8329  . fluconazole (DIFLUCAN) tablet 100 mg  100 mg Oral Daily Modena Jansky, MD   100 mg at 09/13/19 1708  . gabapentin (NEURONTIN) capsule 800 mg  800 mg Oral QID Modena Jansky, MD   800 mg at 09/13/19 2131  . insulin aspart (novoLOG) injection 0-9 Units  0-9 Units Subcutaneous TID WC Modena Jansky, MD   1 Units at 09/13/19 1709  . MEDLINE mouth rinse  15 mL Mouth Rinse q12n4p Modena Jansky, MD   15 mL at 09/13/19 1249  . metoprolol succinate (TOPROL-XL) 24 hr tablet 50 mg  50 mg Oral Daily Modena Jansky, MD   50 mg at 09/13/19 1708  . niacin (NIASPAN) CR tablet 1,000 mg  1,000 mg Oral BID Modena Jansky, MD   1,000 mg at 09/13/19 2312  . nitroGLYCERIN (NITROSTAT) SL tablet 0.4 mg  0.4 mg Sublingual Q5 min PRN Hongalgi, Anand D, MD      . ondansetron (ZOFRAN) tablet 4 mg  4 mg Oral Q6H PRN Hongalgi, Lenis Dickinson, MD       Or  . ondansetron (ZOFRAN) injection 4 mg  4 mg Intravenous Q6H PRN Hongalgi, Anand D, MD      . oxybutynin (DITROPAN) tablet 5 mg  5 mg Oral Q8H PRN Modena Jansky, MD   5 mg at 09/12/19 1156  . pantoprazole (PROTONIX) EC tablet 40 mg  40 mg Oral Daily Modena Jansky, MD   40 mg at 09/13/19 0930  . rosuvastatin (CRESTOR) tablet 40 mg  40 mg Oral Daily Vernell Leep D, MD   40 mg at 09/13/19 0930     Objective: Vital: Vitals:   09/13/19 1737 09/13/19 2029 09/13/19 2315 09/14/19 0515  BP: (!) 145/58 131/63  112/80  Pulse: 81 80 82 79  Resp:  18  18  Temp: 99 F (37.2 C) 100 F (37.8 C)  98.9 F (37.2 C)  TempSrc: Oral Oral  Oral  SpO2: 98% 96% 96% 98%  Weight:      Height:       I/Os: I/O last 3 completed  shifts: In: 1605.8 [P.O.:360; IV Piggyback:1245.8] Out: 1125 [Urine:1125]  Physical Exam:  General: Patient is in no apparent distress; patient sitting up in bed eating and appears to be feeling significantly better than the last 2 days Lungs: Normal respiratory effort, chest expands symmetrically. GI:  The abdomen is soft and nontender without mass. Foley: 20Fr council tip foley draining clear yellow urine without clot Ext: lower extremities symmetric  Lab Results: Recent Labs    09/12/19 0454 09/13/19 0633 09/14/19 0529  WBC 12.0* 5.9 6.0  HGB 10.1* 10.1* 9.8*  HCT 31.3* 30.9* 28.8*   Recent Labs    09/12/19 0454 09/13/19 0633 09/14/19 0529  NA 138 138 139  K 3.7 3.3* 4.1  CL 103 104 105  CO2 24 25 26   GLUCOSE 141* 108* 110*  BUN 19 15 16   CREATININE 1.02 0.80 0.80  CALCIUM 8.2* 8.4* 8.1*   Recent Labs    09/11/19 2343  INR 1.2   No results for input(s): LABURIN in the last 72 hours. Results for orders placed or performed during the hospital encounter of 09/11/19  Blood Culture (routine x 2)     Status: Abnormal (Preliminary result)   Collection Time: 09/11/19 11:38 PM   Specimen: BLOOD  Result Value Ref Range Status   Specimen Description   Final    BLOOD RIGHT ANTECUBITAL Performed at [facility] [address] [place], [place] [address]    Special Requests   Final    BOTTLES DRAWN AEROBIC AND ANAEROBIC Blood Culture adequate volume Performed at [facility] [address] [place], [place] [address]    Culture  Setup Time   Final    IN BOTH AEROBIC AND ANAEROBIC BOTTLES GRAM POSITIVE COCCI IN CHAINS CRITICAL RESULT CALLED TO, READ BACK BY AND VERIFIED WITHGuadlupe Spanish [facility] 09/12/19 1712 JDW Performed at [facility], [address] [place], [place] [address]    Culture ENTEROCOCCUS FAECALIS (A)  Final   Report Status PENDING  Incomplete  Urine culture     Status: Abnormal   Collection Time: 09/11/19  11:38 PM   Specimen: In/Out Cath Urine  Result Value Ref Range Status   Specimen Description   Final    IN/OUT CATH URINE Performed at [facility] [address], [place], [place] [address]    Special Requests  Final    NONE Performed at [facility], [place] [address] [place], [place] [address]    Culture >=100,000 COLONIES/mL ENTEROCOCCUS FAECALIS (A)  Final   Report Status 09/13/2019 FINAL  Final   Organism ID, Bacteria ENTEROCOCCUS FAECALIS (A)  Final      Susceptibility   Enterococcus faecalis - MIC*    AMPICILLIN <=2 SENSITIVE Sensitive     NITROFURANTOIN <=16 SENSITIVE Sensitive     VANCOMYCIN 1 SENSITIVE Sensitive     * >=100,000 COLONIES/mL ENTEROCOCCUS FAECALIS  Blood Culture (routine x 2)     Status: Abnormal (Preliminary result)   Collection Time: 09/11/19 11:43 PM   Specimen: BLOOD RIGHT FOREARM  Result Value Ref Range Status   Specimen Description   Final    BLOOD RIGHT FOREARM Performed at [facility], [place] [address], [place], [place] [address]    Special Requests   Final    BOTTLES DRAWN AEROBIC ONLY Blood Culture results may not be optimal due to an inadequate volume of blood received in culture bottles Performed at [facility] [address] [place], [place] [address]    Culture  Setup Time   Final    AEROBIC BOTTLE ONLY GRAM POSITIVE COCCI IN CHAINS CRITICAL RESULT CALLED TO, READ BACK BY AND VERIFIED WITHGuadlupe Spanish [facility] 09/12/19 2100 Performed at [facility], [place] [address], [place], [place] [address]    Culture ENTEROCOCCUS FAECALIS (A)  Final   Report Status PENDING  Incomplete  Blood Culture ID Panel (Reflexed)     Status: Abnormal   Collection Time: 09/11/19 11:43 PM  Result Value Ref Range Status   Enterococcus species DETECTED (A) NOT DETECTED Final    Comment: CRITICAL RESULT CALLED TO, READ BACK BY AND VERIFIED WITH: N GLOGOVAC PHARMD 09/12/19 2100 JDW    Vancomycin  resistance NOT DETECTED NOT DETECTED Final   Listeria monocytogenes NOT DETECTED NOT DETECTED Final   Staphylococcus species NOT DETECTED NOT DETECTED Final   Staphylococcus aureus (BCID) NOT DETECTED NOT DETECTED Final   Streptococcus species NOT DETECTED NOT DETECTED Final   Streptococcus agalactiae NOT DETECTED NOT DETECTED Final   Streptococcus pneumoniae NOT DETECTED NOT DETECTED Final   Streptococcus pyogenes NOT DETECTED NOT DETECTED Final   Acinetobacter baumannii NOT DETECTED NOT DETECTED Final   Enterobacteriaceae species NOT DETECTED NOT DETECTED Final   Enterobacter cloacae complex NOT DETECTED NOT DETECTED Final   Escherichia coli NOT DETECTED NOT DETECTED Final   Klebsiella oxytoca NOT DETECTED NOT DETECTED Final   Klebsiella pneumoniae NOT DETECTED NOT DETECTED Final   Proteus species NOT DETECTED NOT DETECTED Final   Serratia marcescens NOT DETECTED NOT DETECTED Final   Haemophilus influenzae NOT DETECTED NOT DETECTED Final   Neisseria meningitidis NOT DETECTED NOT DETECTED Final   Pseudomonas aeruginosa NOT DETECTED NOT DETECTED Final   Candida albicans NOT DETECTED NOT DETECTED Final   Candida glabrata NOT DETECTED NOT DETECTED Final   Candida krusei NOT DETECTED NOT DETECTED Final   Candida parapsilosis NOT DETECTED NOT DETECTED Final   Candida tropicalis NOT DETECTED NOT DETECTED Final    Comment: Performed at [facility], [place]. [address], [place], [place] [address]  SARS CORONAVIRUS 2 (TAT 6-24 HRS) Nasopharyngeal Nasopharyngeal Swab     Status: None   Collection Time: 09/12/19  1:44 AM   Specimen: Nasopharyngeal Swab  Result Value Ref Range Status   SARS Coronavirus 2 NEGATIVE NEGATIVE Final    Comment: (NOTE)  SARS-CoV-2 target nucleic acids are NOT DETECTED. The SARS-CoV-2 RNA is generally detectable in upper and lower respiratory specimens during the acute phase of infection. Negative results do not preclude SARS-CoV-2 infection, do not rule  out co-infections with other pathogens, and should not be used as the sole basis for treatment or other patient management decisions. Negative results must be combined with clinical observations, patient history, and epidemiological information. The expected result is Negative. Fact Sheet for Patients: SugarRoll.be Fact Sheet for Healthcare Providers: https://www.woods-mathews.com/ This test is not yet approved or cleared by the [place] FDA and  has been authorized for detection and/or diagnosis of SARS-CoV-2 by FDA under an Emergency Use Authorization (EUA). This EUA will remain  in effect (meaning this test can be used) for the duration of the COVID-19 declaration under Section 56 4(b)(1) of the Act, 21 U.S.C. section 360bbb-3(b)(1), unless the authorization is terminated or revoked sooner. Performed at [facility], [place] [address] [place], [place] [address]   MRSA PCR Screening     Status: None   Collection Time: 09/12/19 10:03 PM   Specimen: Nasal Mucosa; Nasopharyngeal  Result Value Ref Range Status   MRSA by PCR NEGATIVE NEGATIVE Final    Comment:        The GeneXpert MRSA Assay (FDA approved for NASAL specimens only), is one component of a comprehensive MRSA colonization surveillance program. It is not intended to diagnose MRSA infection nor to guide or monitor treatment for MRSA infections. Performed at [facility], [place] [address], [place], [place] [address]    *Note: Due to a large number of results and/or encounters for the requested time period, some results have not been displayed. A complete set of results can be found in Results Review.    Studies/Results: US RENAL  Result Date: 09/12/2019 CLINICAL DATA:  79 year old male with gross hematuria. EXAM: RENAL / URINARY TRACT ULTRASOUND COMPLETE COMPARISON:  09/09/2019 CT FINDINGS: Right Kidney: Renal measurements: 10.6 x 5.6 x 5.7 cm =  volume: 177 mL . Echogenicity within normal limits. No mass or hydronephrosis visualized. Left Kidney: Renal measurements: 13.8 x 5.1 x 5.5 cm = volume: 202 mL. Echogenicity within normal limits. No mass or hydronephrosis visualized. Bladder: No gross bladder abnormalities are noted. Bladder volume is 566 cc. A Foley catheter tip is not identified within the bladder. Other: None. IMPRESSION: 1. Unremarkable kidneys. 2. Foley catheter tip is not identified within the bladder and tip may lie within the urethra. Correlate clinically. No gross bladder abnormality. Electronically Signed   By: Margarette Canada M.D.   On: 09/12/2019 13:41   ECHOCARDIOGRAM COMPLETE  Result Date: 09/13/2019    ECHOCARDIOGRAM REPORT   Patient Name:   Tim [name] Date of Exam: 09/13/2019 Medical Rec #:  [id number]       Height:       72.0 in Accession #:    [id number]      Weight:       331.1 lb Date of Birth:  02-21-1941       BSA:          2.640 m Patient Age:    36 years        BP:           156/72 mmHg Patient Gender: M               HR:           76 bpm. Exam Location:  Inpatient Procedure: 2D Echo,  Color Doppler, Cardiac Doppler and Intracardiac            Opacification Agent Indications:    Bacteremia R78.81  History:        Patient has prior history of Echocardiogram examinations, most                 recent 04/18/2018. CAD, Defibrillator, COPD, Arrythmias:Atrial                 Fibrillation; Risk Factors:Sleep Apnea, Hypertension, Diabetes                 and Dyslipidemia. 64m Sapien S3 TAVR placed on 04/11/16.                 Aortic Valve: 29 mm [place] Sapien prosthetic, stented (TAVR)                 valve is present in the aortic position. Procedure Date:                 04/11/16.  Sonographer:    ERaquel SarnaSenior RDCS Referring Phys: [name] CAMPBELL  Sonographer Comments: Technically difficult study due to poor echo windows, suboptimal parasternal window and suboptimal apical window. IMPRESSIONS  1. Image quality suboptimal for  the detection of valvular vegetations. Recommend TEE if clinically indicated.  2. Left ventricular ejection fraction, by estimation, is 60 to 65%. The left ventricle has normal function. Left ventricular endocardial border not optimally defined to evaluate regional wall motion. There is mild asymmetric left ventricular hypertrophy. Left ventricular diastolic parameters are consistent with Grade I diastolic dysfunction (impaired relaxation). Elevated left ventricular end-diastolic pressure.  3. Right ventricular systolic function was not well visualized. The right ventricular size is not well visualized. There is normal pulmonary artery systolic pressure.  4. The mitral valve is abnormal. Trivial mitral valve regurgitation. No evidence of mitral stenosis.  5. The aortic valve has been repaired/replaced. Aortic valve regurgitation is trivial. No aortic stenosis is present. There is a 29 mm [place] Sapien prosthetic (TAVR) valve present in the aortic position. Procedure Date: 04/11/16. Aortic valve mean gradient measures 18.0 mmHg.  6. The inferior vena cava is normal in size with greater than 50% respiratory variability, suggesting right atrial pressure of 3 mmHg. FINDINGS  Left Ventricle: Left ventricular ejection fraction, by estimation, is 60 to 65%. The left ventricle has normal function. Left ventricular endocardial border not optimally defined to evaluate regional wall motion. The left ventricular internal cavity size was normal in size. There is mild asymmetric left ventricular hypertrophy. Left ventricular diastolic parameters are consistent with Grade I diastolic dysfunction (impaired relaxation). Elevated left ventricular end-diastolic pressure. Right Ventricle: The right ventricular size is not well visualized. Right vetricular wall thickness was not assessed. Right ventricular systolic function was not well visualized. There is normal pulmonary artery systolic pressure. The tricuspid regurgitant velocity  is 1.94 m/s, and with an assumed right atrial pressure of 3 mmHg, the estimated right ventricular systolic pressure is 154.9mmHg. Left Atrium: Left atrial size was not well visualized. Right Atrium: Right atrial size was not well visualized. Pericardium: There is no evidence of pericardial effusion. Mitral Valve: Mobile subvavular calcification not significantly changed from echocardiogram 01/27/2016 on side by side comparison. The mitral valve is abnormal. Normal mobility of the mitral valve leaflets. Moderate to severe mitral annular calcification.  Trivial mitral valve regurgitation. No evidence of mitral valve stenosis. MV peak gradient, 10.4 mmHg. The mean mitral valve gradient is 3.0 mmHg with  average heart rate of 79 bpm. Tricuspid Valve: The tricuspid valve is not well visualized. Tricuspid valve regurgitation is not demonstrated. No evidence of tricuspid stenosis. Aortic Valve: The aortic valve has been repaired/replaced. Aortic valve regurgitation is trivial. No aortic stenosis is present. Aortic valve mean gradient measures 18.0 mmHg. Aortic valve peak gradient measures 30.0 mmHg. Aortic valve area, by VTI measures 1.12 cm. 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 Procedure Date: 04/11/16. Pulmonic Valve: The pulmonic valve was not well visualized. Pulmonic valve regurgitation is not visualized. No evidence of pulmonic stenosis. Aorta: The aortic root was not well visualized. Venous: The inferior vena cava is normal in size with greater than 50% respiratory variability, suggesting right atrial pressure of 3 mmHg. IAS/Shunts: No atrial level shunt detected by color flow Doppler.  LEFT VENTRICLE PLAX 2D LVIDd:         5.20 cm  Diastology LVIDs:         3.90 cm  LV e' lateral:   5.98 cm/s LV PW:         1.10 cm  LV E/e' lateral: 17.7 LV IVS:        0.80 cm  LV e' medial:    6.20 cm/s LVOT diam:     2.20 cm  LV E/e' medial:  17.1 LV SV:         56 LV SV  Index:   21 LVOT Area:     3.80 cm  RIGHT VENTRICLE RV S prime:     10.80 cm/s TAPSE (M-mode): 2.0 cm LEFT ATRIUM            Index       RIGHT ATRIUM           Index LA diam:      4.20 cm  1.59 cm/m  RA Area:     23.80 cm LA Vol (A4C): 141.0 ml 53.41 ml/m RA Volume:   73.50 ml  27.84 ml/m  AORTIC VALVE AV Area (Vmax):    1.10 cm AV Area (Vmean):   1.12 cm AV Area (VTI):     1.12 cm AV Vmax:           274.00 cm/s AV Vmean:          203.000 cm/s AV VTI:            0.500 m AV Peak Grad:      30.0 mmHg AV Mean Grad:      18.0 mmHg LVOT Vmax:         79.20 cm/s LVOT Vmean:        60.000 cm/s LVOT VTI:          0.147 m LVOT/AV VTI ratio: 0.29 MITRAL VALVE                TRICUSPID VALVE MV Area (PHT): 2.73 cm     TR Peak grad:   15.1 mmHg MV Peak grad:  10.4 mmHg    TR Vmax:        194.00 cm/s MV Mean grad:  3.0 mmHg MV Vmax:       1.61 m/s     SHUNTS MV Vmean:      104.0 cm/s   Systemic VTI:  0.15 m MV Decel Time: 278 msec     Systemic Diam: 2.20 cm MV E velocity: 106.00 cm/s MV A velocity: 149.00 cm/s MV E/A ratio:  0.71 Cherlynn Kaiser MD Electronically signed by  Cherlynn Kaiser MD Signature Date/Time: 09/13/2019/9:18:49 PM    Final     Assessment: 79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  Plan: -Patient to be treated for fungal balanitis with Diflucan and antibiotics tailored to Enterococcus based on urine and blood cultures -Continue Foley catheter and patient to follow-up as an outpatient with Dr. Tresa Moore to discuss surgical revision -Urine is clear and may resume anticoagulation  Tim Lefevre, MD Urology 09/14/2019, 9:17 AM

## 2019-09-14 NOTE — Progress Notes (Signed)
ANTICOAGULATION CONSULT NOTE - Initial Consult  Pharmacy Consult for Eliquis Indication: Afib  Allergies  Allergen Reactions  . Brimonidine Tartrate Shortness Of Breath    Alphagan-Shortness of breath  . Brimonidine Tartrate Palpitations  . Brinzolamide Shortness Of Breath    AZOPT- Shortness of breath  . Latanoprost Shortness Of Breath    XALATAN- Shortness of breath  . Nucynta [Tapentadol] Shortness Of Breath  . Sulfa Antibiotics Palpitations  . Timolol Maleate Shortness Of Breath and Other (See Comments)    TIMOPTIC- Aggravated asthma  . Cardizem  [Diltiazem Hcl] Swelling  . Diltiazem Swelling     leg swelling  . Rofecoxib Swelling     VIOXX- leg swelling  . Vancomycin Hives and Other (See Comments)    Possible "Red Man Syndrome"? > hives/blisters  . Codeine Other (See Comments)    Childhood reaction  . Celecoxib Other (See Comments)    CELLBREX-confusion  . Colchicine Diarrhea    diarrhea  . Tape Rash    Patient Measurements: Height: 6' (182.9 cm) Weight: (!) 331 lb 2.1 oz (150.2 kg) IBW/kg (Calculated) : 77.6  Vital Signs: Temp: 98.9 F (37.2 C) (03/14 0515) Temp Source: Oral (03/14 0515) BP: 112/80 (03/14 0515) Pulse Rate: 79 (03/14 0515)  Labs: Recent Labs    09/11/19 2343 09/11/19 2343 09/12/19 0454 09/12/19 0454 09/13/19 0633 09/14/19 0529  HGB 10.8*   < > 10.1*   < > 10.1* 9.8*  HCT 33.9*   < > 31.3*  --  30.9* 28.8*  PLT 127*   < > 117*  --  109* 86*  APTT 31  --   --   --   --   --   LABPROT 15.3*  --   --   --   --   --   INR 1.2  --   --   --   --   --   CREATININE 1.08   < > 1.02  --  0.80 0.80   < > = values in this interval not displayed.    Estimated Creatinine Clearance: 112.9 mL/min (by C-G formula based on SCr of 0.8 mg/dL).   Medical History: Past Medical History:  Diagnosis Date  . AICD (automatic cardioverter/defibrillator) present   . ASTHMA   . Asthma    as a child  . BPH (benign prostatic hypertrophy)   . CAD  (coronary artery disease)    Nonobstructive by 12/2012 cath; then 03/2016 he required BMS to RCA ([facility]).  In-stent restenosis on cath 11/01/16, baloon angioplasty successful--pt to take plavix (no ASA), + eliquis (for PAF).  [name] CATARACT, HX OF   . Chronic combined systolic and diastolic CHF (congestive heart failure) ([place]) 05/2016   Ischemic CM.  04/2018 EF 40-45%, grd I DD.  [name] Chronic pain syndrome    Lumbar DDD; chronic neuropathic pain (DM); has spinal stimulator and sees pain mgmt MD  . COPD   . Diabetes mellitus type 2 with complications (HCC)    IOX7D jumped from 5.7% to 6.1% 03/2015---started metformin at that time.  DM 2 dx by fasting gluc criteria 2018.  Has chronic neuropath pain  . Elevated transaminase level 02/2016   Suspect due to fatty liver (documented on u/s 2007). Plan repeat labs 03/2016.  [name] Essential hypertension, benign   . Fatty liver 2007   2007 u/s showed fatty liver with hepatosplenomegaly.  2019 repeat u/s->fatty liver but no cirrhosis or hepatosplenomegaly.  [name] GERD (gastroesophageal reflux disease)   . Glaucoma   .  GOUT   . Hallux valgus (acquired)   . HH (hiatus hernia)   . HYPERCHOLESTEROLEMIA-PURE   . Hypogonadism male   . Lumbosacral neuritis   . Lumbosacral spondylosis    Lumbar spinal stenosis with neurogenic claudication--contributes to his chronic pain syndrome  . Normal memory function 08/2014   Neuropsychological testing ([facility]): no cognitive impairment or sign of neurodegenerative disorder.  Likely has adjustment d/o with mixed anxiety/depressed features and may benefit from low dose SNRI.    [name] Normocytic anemia 03/2016   Mild-pt needs ferritin and vit B12 level checked (as of 03/22/16)  . NSTEMI (non-ST elevated myocardial infarction) ([place]) 03/20/2016   BMS to RCA  . Obesity hypoventilation syndrome ([place])   . Obesity, unspecified   . Orthostatic hypotension   . OSA on CPAP    8 cm H2O  . OSTEOARTHRITIS   . Paroxysmal atrial  fibrillation ([place]) 2003    (? chronic?) Off anticoag for a while due to falls.  Then apixaban started 12/2014.  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 Ella Bodo): neuropathic pain->diff to treat, failed nucynta, failed spinal stimul trial, oxycontin hs + tramadol + gabap as of 12/2017 f/u Dr. Letta Pate.  . Personal history of colonic adenoma 10/30/2012   Diminutive adenoma, consider repeat 2019 per GI  . Pneumonia   . Presence of cardiac defibrillator 11/07/2017  . Primary osteoarthritis of both knees    Bone on bone of medial compartments, + signif patellofemoral arth bilat.--supartz inj series started 09/12/17  . PUD (peptic ulcer disease)   . PULMONARY HYPERTENSION, HX OF   . Secondary male hypogonadism 2017  . 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 Shortness of breath    with exertion: much improved s/p TAVR and treatment for CHF.  . Sick sinus syndrome (HCC)    PPM placed  . Thrombocytopenia ([place]) 2018   HSM on 2007 abd u/s---suspect some mild splenic sequestration chronically.  [name] Unspecified glaucoma(365.9)   . Unspecified hereditary and idiopathic peripheral neuropathy approx age 57   bilat LE's, ? left arm, too.  Feet became progressively numb + left foot pain intermittently.  Pt may be trying a spinal stimulator (as of 05/2015)  . VENOUS INSUFFICIENCY    Being followed by Dr. Sharol Given as of 10/2016 for two R LL venous stasis ulcers/skin tears.  Healed as of 10/30/16 f/u with Dr. Sharol Given.  . VENTRAL HERNIA     Medications:  Medications Prior to Admission  Medication Sig Dispense Refill Last Dose  . albuterol (PROVENTIL HFA;VENTOLIN HFA) 108 (90 BASE) MCG/ACT inhaler Inhale 2 puffs into the lungs 4 (four) times daily as needed for wheezing or shortness of breath.   unk  . allopurinol (ZYLOPRIM) 300 MG tablet TAKE 1 TABLET BY MOUTH ONCE DAILY WITH FOOD (Patient taking differently: Take  300 mg by mouth daily. ) 90 tablet 1 09/11/2019 at Unknown time  . apixaban (ELIQUIS) 5 MG TABS tablet Take 1 tablet (5 mg total) by mouth 2 (two) times daily. 60 tablet 0 09/11/2019 at 2000  . ASPIRIN LOW DOSE 81 MG EC tablet Take 81 mg by mouth daily.   09/11/2019 at Unknown time  . B Complex-C (B-COMPLEX WITH VITAMIN C) tablet Take 1 tablet by mouth daily. Take 1 tablet daily, 232m   09/11/2019 at Unknown time  . cyclobenzaprine (FLEXERIL) 5 MG tablet Take 0.5 tablet to 1  tablet at bedtime  for muscle spasm. (Patient taking differently: Take 5 mg by mouth at bedtime. Take 0.5 tablet to 1 tablet at bedtime  for muscle spasm.) 30 tablet 1 09/10/2019  . ezetimibe (ZETIA) 10 MG tablet take 1 tablet by mouth once daily (Patient taking differently: Take 10 mg by mouth daily. ) 90 tablet 3 09/11/2019 at Unknown time  . finasteride (PROSCAR) 5 MG tablet TAKE 1 TABLET BY MOUTH EVERY DAY (Patient taking differently: Take 5 mg by mouth daily. ) 90 tablet 0 09/11/2019 at Unknown time  . fluconazole (DIFLUCAN) 200 MG tablet Take 1 tablet (200 mg total) by mouth daily for 7 days. 7 tablet 0 09/11/2019 at Unknown time  . fluticasone (FLONASE) 50 MCG/ACT nasal spray Place 2 sprays into both nostrils as needed for allergies or rhinitis.   Past Month at Unknown time  . gabapentin (NEURONTIN) 800 MG tablet TAKE 1 TABLET (800 MG TOTAL) BY MOUTH 4 (FOUR) TIMES DAILY. 360 tablet 2 09/11/2019 at Unknown time  . isosorbide mononitrate (IMDUR) 60 MG 24 hr tablet TAKE 1 TABLET BY MOUTH EVERY DAY (Patient taking differently: Take 60 mg by mouth daily. ) 90 tablet 3 09/11/2019 at Unknown time  . metoprolol succinate (TOPROL-XL) 25 MG 24 hr tablet Take 25 mg by mouth daily. Along w/ 31m to equal 732mdaily   09/11/2019 at 1100  . metoprolol succinate (TOPROL-XL) 50 MG 24 hr tablet Take 50 mg by mouth daily. Take along with the 2598mablet to make total dose of 57m16m3/05/2020 at 1100  . Multiple Vitamin (MULTIVITAMIN) capsule Take 1  capsule by mouth daily.   09/11/2019  . niacin (NIASPAN) 1000 MG CR tablet TAKE 1 TABLET BY MOUTH TWICE A DAY (Patient taking differently: Take 1,000 mg by mouth 2 (two) times daily. ) 180 tablet 1 09/11/2019 at Unknown time  . nitroGLYCERIN (NITROSTAT) 0.4 MG SL tablet Place 0.4 mg under the tongue every 5 (five) minutes as needed for chest pain.    unk  . nystatin (MYCOSTATIN/NYSTOP) powder Apply 1 application topically 3 (three) times daily. 15 g 0 09/11/2019 at Unknown time  . omeprazole (PRILOSEC) 10 MG capsule TAKE 1 CAPSULE BY MOUTH EVERY DAY (Patient taking differently: Take 10 mg by mouth daily. ) 90 capsule 0 09/11/2019 at Unknown time  . rosuvastatin (CRESTOR) 40 MG tablet take 1 tablet by mouth once daily (Patient taking differently: Take 40 mg by mouth daily. ) 15 tablet 0 09/11/2019 at Unknown time  . sacubitril-valsartan (ENTRESTO) 24-26 MG Take 1 tablet by mouth 2 (two) times daily.   09/11/2019 at Unknown time  . VITAMIN D, CHOLECALCIFEROL, PO Take 1 tablet by mouth daily.    09/11/2019 at Unknown time  . KLOR-CON M20 20 MEQ tablet TAKE 1 TABLET BY MOUTH EVERY DAY (Patient not taking: Reported on 09/09/2019) 90 tablet 0 Not Taking at Unknown time   Scheduled:  . allopurinol  300 mg Oral Daily  . apixaban  5 mg Oral BID  . aspirin  81 mg Oral Daily  . chlorhexidine  15 mL Mouth Rinse BID  . Chlorhexidine Gluconate Cloth  6 each Topical Daily  . cyclobenzaprine  5 mg Oral QHS  . ezetimibe  10 mg Oral Daily  . finasteride  5 mg Oral Daily  . fluconazole  100 mg Oral Daily  . gabapentin  800 mg Oral QID  . insulin aspart  0-9 Units Subcutaneous TID WC  . isosorbide mononitrate  60  mg Oral Daily  . mouth rinse  15 mL Mouth Rinse q12n4p  . metoprolol succinate  50 mg Oral Daily  . niacin  1,000 mg Oral BID  . pantoprazole  40 mg Oral Daily  . rosuvastatin  40 mg Oral Daily  . sacubitril-valsartan  1 tablet Oral BID   Infusions:  . ampicillin (OMNIPEN) IV 2 g (09/14/19 1151)     Assessment: 40 yoM with PMH PAF on Eliquis, CAD s/p PCI on ASA 81 mg, combined CHF, TAVR, ICD, DM2, admitted for urologic infection with hematuria. Eliquis held; last dose 3/11 per med rec. Pharmacy consulted to resume Eliquis 3/14.  Today, 09/14/2019:  Hgb & Plt both slightly low on admission, today continuing to drop  SCr stable back to baseline  No further hematuria  Goal of Therapy:  Prevention of thromboembolism   Plan:   Resume Eliquis 5 mg PO bid  Continue aspirin  Reuel Boom, PharmD, BCPS [phone] 09/14/2019, 12:48 PM

## 2019-09-15 ENCOUNTER — Encounter (HOSPITAL_COMMUNITY): Payer: Self-pay | Admitting: Internal Medicine

## 2019-09-15 DIAGNOSIS — Z89412 Acquired absence of left great toe: Secondary | ICD-10-CM

## 2019-09-15 DIAGNOSIS — Z4502 Encounter for adjustment and management of automatic implantable cardiac defibrillator: Secondary | ICD-10-CM

## 2019-09-15 LAB — CULTURE, BLOOD (ROUTINE X 2): Special Requests: ADEQUATE

## 2019-09-15 LAB — CBC
HCT: 29.3 % — ABNORMAL LOW (ref 39.0–52.0)
Hemoglobin: 9.4 g/dL — ABNORMAL LOW (ref 13.0–17.0)
MCH: 30 pg (ref 26.0–34.0)
MCHC: 32.1 g/dL (ref 30.0–36.0)
MCV: 93.6 fL (ref 80.0–100.0)
Platelets: 124 10*3/uL — ABNORMAL LOW (ref 150–400)
RBC: 3.13 MIL/uL — ABNORMAL LOW (ref 4.22–5.81)
RDW: 14.5 % (ref 11.5–15.5)
WBC: 4.4 10*3/uL (ref 4.0–10.5)
nRBC: 0 % (ref 0.0–0.2)

## 2019-09-15 LAB — COMPREHENSIVE METABOLIC PANEL
ALT: 123 U/L — ABNORMAL HIGH (ref 0–44)
AST: 105 U/L — ABNORMAL HIGH (ref 15–41)
Albumin: 2.6 g/dL — ABNORMAL LOW (ref 3.5–5.0)
Alkaline Phosphatase: 134 U/L — ABNORMAL HIGH (ref 38–126)
Anion gap: 9 (ref 5–15)
BUN: 16 mg/dL (ref 8–23)
CO2: 25 mmol/L (ref 22–32)
Calcium: 8.2 mg/dL — ABNORMAL LOW (ref 8.9–10.3)
Chloride: 104 mmol/L (ref 98–111)
Creatinine, Ser: 0.76 mg/dL (ref 0.61–1.24)
GFR calc Af Amer: >60 mL/min (ref >60)
GFR calc non Af Amer: >60 mL/min (ref >60)
Glucose, Bld: 132 mg/dL — ABNORMAL HIGH (ref 70–99)
Potassium: 3.3 mmol/L — ABNORMAL LOW (ref 3.5–5.1)
Sodium: 138 mmol/L (ref 135–145)
Total Bilirubin: 0.8 mg/dL (ref 0.3–1.2)
Total Protein: 5.5 g/dL — ABNORMAL LOW (ref 6.5–8.1)

## 2019-09-15 LAB — GLUCOSE, CAPILLARY
Glucose-Capillary: 105 mg/dL — ABNORMAL HIGH (ref 70–99)
Glucose-Capillary: 160 mg/dL — ABNORMAL HIGH (ref 70–99)
Glucose-Capillary: 118 mg/dL — ABNORMAL HIGH (ref 70–99)
Glucose-Capillary: 139 mg/dL — ABNORMAL HIGH (ref 70–99)

## 2019-09-15 MED ORDER — FLUTICASONE PROPIONATE 50 MCG/ACT NA SUSP
2.0000 | NASAL | Status: DC | PRN
  Administered 2019-09-15 – 2019-09-16 (×2): 2 via NASAL
  Filled 2019-09-15 (×2): qty 16

## 2019-09-15 MED ORDER — SODIUM CHLORIDE 0.9 % IV SOLN: INTRAVENOUS | Status: DC

## 2019-09-15 MED ORDER — ASPIRIN EC 81 MG PO TBEC
81.0000 mg | DELAYED_RELEASE_TABLET | Freq: Every day | ORAL | Status: DC
  Administered 2019-09-15 – 2019-09-17 (×3): 81 mg via ORAL
  Filled 2019-09-15 (×3): qty 1

## 2019-09-15 MED ORDER — POTASSIUM CHLORIDE 10 MEQ/100ML IV SOLN
10.0000 meq | INTRAVENOUS | Status: AC
  Administered 2019-09-15 (×3): 10 meq via INTRAVENOUS
  Filled 2019-09-15 (×3): qty 100

## 2019-09-15 NOTE — Progress Notes (Signed)
Subjective: No new complaints   Antibiotics:  Anti-infectives (From admission, onward)   Start     Dose/Rate Route Frequency Ordered Stop   09/13/19 1600  fluconazole (DIFLUCAN) tablet 100 mg     100 mg Oral Daily 09/13/19 1551 09/20/19 0959   09/13/19 1200  ampicillin (OMNIPEN) 2 g in sodium chloride 0.9 % 100 mL IVPB     2 g 300 mL/hr over 20 Minutes Intravenous Every 4 hours 09/13/19 1104     09/12/19 2200  linezolid (ZYVOX) IVPB 600 mg  Status:  Discontinued     600 mg 300 mL/hr over 60 Minutes Intravenous Every 12 hours 09/12/19 1214 09/13/19 1104   09/12/19 0600  piperacillin-tazobactam (ZOSYN) IVPB 3.375 g  Status:  Discontinued     3.375 g 12.5 mL/hr over 240 Minutes Intravenous Every 8 hours 09/11/19 2354 09/13/19 1104   09/12/19 0000  DAPTOmycin (CUBICIN) 750 mg in sodium chloride 0.9 % IVPB     750 mg 230 mL/hr over 30 Minutes Intravenous Once 09/11/19 2353 09/12/19 0131   09/12/19 0000  piperacillin-tazobactam (ZOSYN) IVPB 3.375 g     3.375 g 100 mL/hr over 30 Minutes Intravenous  Once 09/11/19 2354 09/12/19 0122      Medications: Scheduled Meds: . allopurinol  300 mg Oral Daily  . apixaban  5 mg Oral BID  . aspirin EC  81 mg Oral Daily  . chlorhexidine  15 mL Mouth Rinse BID  . Chlorhexidine Gluconate Cloth  6 each Topical Daily  . cyclobenzaprine  5 mg Oral QHS  . ezetimibe  10 mg Oral Daily  . finasteride  5 mg Oral Daily  . fluconazole  100 mg Oral Daily  . gabapentin  800 mg Oral QID  . insulin aspart  0-9 Units Subcutaneous TID WC  . isosorbide mononitrate  60 mg Oral Daily  . mouth rinse  15 mL Mouth Rinse q12n4p  . metoprolol succinate  50 mg Oral Daily  . niacin  1,000 mg Oral BID  . pantoprazole  40 mg Oral Daily  . rosuvastatin  40 mg Oral Daily  . sacubitril-valsartan  1 tablet Oral BID   Continuous Infusions: . [START ON 09/16/2019] sodium chloride    . ampicillin (OMNIPEN) IV 2 g (09/15/19 1357)   PRN Meds:.acetaminophen  **OR** acetaminophen, albuterol, nitroGLYCERIN, ondansetron **OR** ondansetron (ZOFRAN) IV, oxybutynin    Objective: Weight change:   Intake/Output Summary (Last 24 hours) at 09/15/2019 1445 Last data filed at 09/15/2019 0800 Gross per 24 hour  Intake 0 ml  Output 450 ml  Net -450 ml   Blood pressure (!) 164/60, pulse 75, temperature 98.2 F (36.8 C), temperature source Oral, resp. rate 18, height 6' (1.829 m), weight (!) 150.2 kg, SpO2 96 %. Temp:  [98.2 F (36.8 C)-98.6 F (37 C)] 98.2 F (36.8 C) (03/15 1233) Pulse Rate:  [66-77] 75 (03/15 1233) Resp:  [16-18] 18 (03/15 0459) BP: (112-164)/(55-62) 164/60 (03/15 1233) SpO2:  [96 %-97 %] 96 % (03/15 1233)  Physical Exam: General: Alert and awake, oriented x3, not in any acute distress. HEENT: anicteric sclera, EOMI CVS regular rate, normal  Pacer site is clean Chest: , no wheezing, no respiratory distress Abdomen: soft non-distended,  Extremities:   Left amputation site 09/15/2019:      Right foot 09/15/2019:       Neuro: nonfocal  CBC:    BMET Recent Labs    09/14/19 0529 09/15/19 0452  NA 139  138  K 4.1 3.3*  CL 105 104  CO2 26 25  GLUCOSE 110* 132*  BUN 16 16  CREATININE 0.80 0.76  CALCIUM 8.1* 8.2*     Liver Panel  Recent Labs    09/14/19 0529 09/15/19 0452  PROT 5.3* 5.5*  ALBUMIN 2.4* 2.6*  AST 178* 105*  ALT 153* 123*  ALKPHOS 137* 134*  BILITOT 1.1 0.8       Sedimentation Rate No results for input(s): ESRSEDRATE in the last 72 hours. C-Reactive Protein No results for input(s): CRP in the last 72 hours.  Micro Results: Recent Results (from the past 720 hour(s))  SARS CORONAVIRUS 2 (TAT 6-24 HRS) Nasopharyngeal Nasopharyngeal Swab     Status: None   Collection Time: 08/28/19  1:05 PM   Specimen: Nasopharyngeal Swab  Result Value Ref Range Status   SARS Coronavirus 2 NEGATIVE NEGATIVE Final    Comment: (NOTE) SARS-CoV-2 target nucleic acids are NOT DETECTED. The  SARS-CoV-2 RNA is generally detectable in upper and lower respiratory specimens during the acute phase of infection. Negative results do not preclude SARS-CoV-2 infection, do not rule out co-infections with other pathogens, and should not be used as the sole basis for treatment or other patient management decisions. Negative results must be combined with clinical observations, patient history, and epidemiological information. The expected result is Negative. Fact Sheet for Patients: SugarRoll.be Fact Sheet for Healthcare Providers: https://www.woods-mathews.com/ This test is not yet approved or cleared by the [place] FDA and  has been authorized for detection and/or diagnosis of SARS-CoV-2 by FDA under an Emergency Use Authorization (EUA). This EUA will remain  in effect (meaning this test can be used) for the duration of the COVID-19 declaration under Section 56 4(b)(1) of the Act, 21 U.S.C. section 360bbb-3(b)(1), unless the authorization is terminated or revoked sooner. Performed at [facility], [place] [address] [place], [place] [address]   Urine culture     Status: Abnormal   Collection Time: 09/09/19  5:32 PM   Specimen: Urine, Clean Catch  Result Value Ref Range Status   Specimen Description URINE, CLEAN CATCH  Final   Special Requests   Final    NONE Performed at [facility], [place] [address] [place], [place] [address]    Culture (A)  Final    >=100,000 COLONIES/mL ENTEROCOCCUS FAECALIS >=100,000 COLONIES/mL AEROCOCCUS URINAE    Report Status 09/12/2019 FINAL  Final   Organism ID, Bacteria ENTEROCOCCUS FAECALIS (A)  Final      Susceptibility   Enterococcus faecalis - MIC*    AMPICILLIN <=2 SENSITIVE Sensitive     NITROFURANTOIN <=16 SENSITIVE Sensitive     VANCOMYCIN 1 SENSITIVE Sensitive     * >=100,000 COLONIES/mL ENTEROCOCCUS FAECALIS  Blood Culture (routine x 2)     Status: Abnormal   Collection Time:  09/11/19 11:38 PM   Specimen: BLOOD  Result Value Ref Range Status   Specimen Description   Final    BLOOD RIGHT ANTECUBITAL Performed at [facility] [address] [place], [place] [address]    Special Requests   Final    BOTTLES DRAWN AEROBIC AND ANAEROBIC Blood Culture adequate volume Performed at [place] [address], [place], [place] [address]    Culture  Setup Time   Final    IN BOTH AEROBIC AND ANAEROBIC BOTTLES GRAM POSITIVE COCCI IN CHAINS CRITICAL RESULT CALLED TO, READ BACK BY AND VERIFIED WITH: N GLOGOVAC PHARMD 09/12/19 1712 JDW  Culture (A)  Final    ENTEROCOCCUS FAECALIS SUSCEPTIBILITIES PERFORMED ON PREVIOUS CULTURE WITHIN THE LAST 5 DAYS. Performed at [facility], [place] [address] [place], [place] [address]    Report Status 09/15/2019 FINAL  Final  Urine culture     Status: Abnormal   Collection Time: 09/11/19 11:38 PM   Specimen: In/Out Cath Urine  Result Value Ref Range Status   Specimen Description   Final    IN/OUT CATH URINE Performed at [facility] [address], [place], [place] [address]    Special Requests   Final    NONE Performed at [name], [place] [address], [place], [place] [address]    Culture >=100,000 COLONIES/mL ENTEROCOCCUS FAECALIS (A)  Final   Report Status 09/13/2019 FINAL  Final   Organism ID, Bacteria ENTEROCOCCUS FAECALIS (A)  Final      Susceptibility   Enterococcus faecalis - MIC*    AMPICILLIN <=2 SENSITIVE Sensitive     NITROFURANTOIN <=16 SENSITIVE Sensitive     VANCOMYCIN 1 SENSITIVE Sensitive     * >=100,000 COLONIES/mL ENTEROCOCCUS FAECALIS  Blood Culture (routine x 2)     Status: Abnormal   Collection Time: 09/11/19 11:43 PM   Specimen: BLOOD RIGHT FOREARM  Result Value Ref Range Status   Specimen Description   Final    BLOOD RIGHT FOREARM Performed at [facility], [place] [address], [place], [place] [address]     Special Requests   Final    BOTTLES DRAWN AEROBIC ONLY Blood Culture results may not be optimal due to an inadequate volume of blood received in culture bottles Performed at [facility] [address], [place], [place] [address]    Culture  Setup Time   Final    AEROBIC BOTTLE ONLY GRAM POSITIVE COCCI IN CHAINS CRITICAL RESULT CALLED TO, READ BACK BY AND VERIFIED WITHGuadlupe [facility] 09/12/19 2100 Performed at [facility], [place] [address] [place], [place] [address]    Culture ENTEROCOCCUS FAECALIS (A)  Final   Report Status 09/15/2019 FINAL  Final   Organism ID, Bacteria ENTEROCOCCUS FAECALIS  Final      Susceptibility   Enterococcus faecalis - MIC*    AMPICILLIN <=2 SENSITIVE Sensitive     VANCOMYCIN 1 SENSITIVE Sensitive     GENTAMICIN SYNERGY SENSITIVE Sensitive     * ENTEROCOCCUS FAECALIS  Blood Culture ID Panel (Reflexed)     Status: Abnormal   Collection Time: 09/11/19 11:43 PM  Result Value Ref Range Status   Enterococcus species DETECTED (A) NOT DETECTED Final    Comment: CRITICAL RESULT CALLED TO, READ BACK BY AND VERIFIED WITH: N GLOGOVAC PHARMD 09/12/19 2100 JDW    Vancomycin resistance NOT DETECTED NOT DETECTED Final   Listeria monocytogenes NOT DETECTED NOT DETECTED Final   Staphylococcus species NOT DETECTED NOT DETECTED Final   Staphylococcus aureus (BCID) NOT DETECTED NOT DETECTED Final   Streptococcus species NOT DETECTED NOT DETECTED Final   Streptococcus agalactiae NOT DETECTED NOT DETECTED Final   Streptococcus pneumoniae NOT DETECTED NOT DETECTED Final   Streptococcus pyogenes NOT DETECTED NOT DETECTED Final   Acinetobacter baumannii NOT DETECTED NOT DETECTED Final   Enterobacteriaceae species NOT DETECTED NOT DETECTED Final   Enterobacter cloacae complex NOT DETECTED NOT DETECTED Final   Escherichia coli NOT DETECTED NOT DETECTED Final   Klebsiella oxytoca NOT DETECTED NOT DETECTED Final   Klebsiella pneumoniae NOT DETECTED NOT  DETECTED Final  Proteus species NOT DETECTED NOT DETECTED Final   Serratia marcescens NOT DETECTED NOT DETECTED Final   Haemophilus influenzae NOT DETECTED NOT DETECTED Final   Neisseria meningitidis NOT DETECTED NOT DETECTED Final   Pseudomonas aeruginosa NOT DETECTED NOT DETECTED Final   Candida albicans NOT DETECTED NOT DETECTED Final   Candida glabrata NOT DETECTED NOT DETECTED Final   Candida krusei NOT DETECTED NOT DETECTED Final   Candida parapsilosis NOT DETECTED NOT DETECTED Final   Candida tropicalis NOT DETECTED NOT DETECTED Final    Comment: Performed at [facility], [place] [address], [place], [place] [address]  SARS CORONAVIRUS 2 (TAT 6-24 HRS) Nasopharyngeal Nasopharyngeal Swab     Status: None   Collection Time: 09/12/19  1:44 AM   Specimen: Nasopharyngeal Swab  Result Value Ref Range Status   SARS Coronavirus 2 NEGATIVE NEGATIVE Final    Comment: (NOTE) SARS-CoV-2 target nucleic acids are NOT DETECTED. The SARS-CoV-2 RNA is generally detectable in upper and lower respiratory specimens during the acute phase of infection. Negative results do not preclude SARS-CoV-2 infection, do not rule out co-infections with other pathogens, and should not be used as the sole basis for treatment or other patient management decisions. Negative results must be combined with clinical observations, patient history, and epidemiological information. The expected result is Negative. Fact Sheet for Patients: SugarRoll.be Fact Sheet for Healthcare Providers: https://www.woods-mathews.com/ This test is not yet approved or cleared by the [place] FDA and  has been authorized for detection and/or diagnosis of SARS-CoV-2 by FDA under an Emergency Use Authorization (EUA). This EUA will remain  in effect (meaning this test can be used) for the duration of the COVID-19 declaration under Section 56 4(b)(1) of the Act, 21 U.S.C. section  360bbb-3(b)(1), unless the authorization is terminated or revoked sooner. Performed at [facility], [place] [address], [place], [place] [address]   MRSA PCR Screening     Status: None   Collection Time: 09/12/19 10:03 PM   Specimen: Nasal Mucosa; Nasopharyngeal  Result Value Ref Range Status   MRSA by PCR NEGATIVE NEGATIVE Final    Comment:        The GeneXpert MRSA Assay (FDA approved for NASAL specimens only), is one component of a comprehensive MRSA colonization surveillance program. It is not intended to diagnose MRSA infection nor to guide or monitor treatment for MRSA infections. Performed at [facility], [place] [address], [place], [place] [address]   Culture, blood (routine x 2)     Status: None (Preliminary result)   Collection Time: 09/13/19 11:43 AM   Specimen: BLOOD  Result Value Ref Range Status   Specimen Description BLOOD RIGHT HAND  Final   Special Requests   Final    BOTTLES DRAWN AEROBIC AND ANAEROBIC Blood Culture adequate volume Performed at [facility] [address] [place], [place] [address]    Culture NO GROWTH 2 DAYS  Final   Report Status PENDING  Incomplete  Culture, blood (routine x 2)     Status: None (Preliminary result)   Collection Time: 09/13/19 11:55 AM   Specimen: BLOOD  Result Value Ref Range Status   Specimen Description BLOOD RIGHT ANTECUBITAL  Final   Special Requests   Final    BOTTLES DRAWN AEROBIC AND ANAEROBIC Blood Culture adequate volume Performed at [facility] [address], [place],  [address]    Culture NO GROWTH 2 DAYS  Final   Report Status PENDING  Incomplete    Studies/Results: ECHOCARDIOGRAM COMPLETE  Result Date: 09/13/2019    ECHOCARDIOGRAM REPORT   Patient Name:   Tim [name] Date of Exam: 09/13/2019 Medical Rec #:  [id number]       Height:       72.0 in Accession #:    [id number]      Weight:       331.1 lb Date of Birth:  [birth year]        BSA:          2.640 m Patient Age:    89 years        BP:           156/72 mmHg Patient Gender: M               HR:           76 bpm. Exam Location:  Inpatient Procedure: 2D Echo, Color Doppler, Cardiac Doppler and Intracardiac            Opacification Agent Indications:    Bacteremia R78.81  History:        Patient has prior history of Echocardiogram examinations, most                 recent 04/18/2018. CAD, Defibrillator, COPD, Arrythmias:Atrial                 Fibrillation; Risk Factors:Sleep Apnea, Hypertension, Diabetes                 and Dyslipidemia. 27m Sapien S3 TAVR placed on 04/11/16.                 Aortic Valve: 29 mm Edwards Sapien prosthetic, stented (TAVR)                 valve is present in the aortic position. Procedure Date:                 04/11/16.  Sonographer:    ERaquel SarnaSenior RDCS Referring Phys: [name] CAMPBELL  Sonographer Comments: Technically difficult study due to poor echo windows, suboptimal parasternal window and suboptimal apical window. IMPRESSIONS  1. Image quality suboptimal for the detection of valvular vegetations. Recommend TEE if clinically indicated.  2. Left ventricular ejection fraction, by estimation, is 60 to 65%. The left ventricle has normal function. Left ventricular endocardial border not optimally defined to evaluate regional wall motion. There is mild asymmetric left ventricular hypertrophy. Left ventricular diastolic parameters are consistent with Grade I diastolic dysfunction (impaired relaxation). Elevated left ventricular end-diastolic pressure.  3. Right ventricular systolic function was not well visualized. The right ventricular size is not well visualized. There is normal pulmonary artery systolic pressure.  4. The mitral valve is abnormal. Trivial mitral valve regurgitation. No evidence of mitral stenosis.  5. The aortic valve has been repaired/replaced. Aortic valve regurgitation is trivial. No aortic stenosis is present. There is a 29 mm Edwards  Sapien prosthetic (TAVR) valve present in the aortic position. Procedure Date: 04/11/16. Aortic valve mean gradient measures 18.0 mmHg.  6. The inferior vena cava is normal in size with greater than 50% respiratory variability, suggesting right atrial pressure of 3 mmHg. FINDINGS  Left Ventricle: Left ventricular ejection fraction, by estimation, is 60 to 65%. The left ventricle has normal function. Left ventricular endocardial border not optimally defined to evaluate regional wall motion. The left ventricular internal cavity size was normal in size. There is mild asymmetric left ventricular hypertrophy. Left ventricular diastolic parameters are  consistent with Grade I diastolic dysfunction (impaired relaxation). Elevated left ventricular end-diastolic pressure. Right Ventricle: The right ventricular size is not well visualized. Right vetricular wall thickness was not assessed. Right ventricular systolic function was not well visualized. There is normal pulmonary artery systolic pressure. The tricuspid regurgitant velocity is 1.94 m/s, and with an assumed right atrial pressure of 3 mmHg, the estimated right ventricular systolic pressure is 69.6 mmHg. Left Atrium: Left atrial size was not well visualized. Right Atrium: Right atrial size was not well visualized. Pericardium: There is no evidence of pericardial effusion. Mitral Valve: Mobile subvavular calcification not significantly changed from echocardiogram 01/27/2016 on side by side comparison. The mitral valve is abnormal. Normal mobility of the mitral valve leaflets. Moderate to severe mitral annular calcification.  Trivial mitral valve regurgitation. No evidence of mitral valve stenosis. MV peak gradient, 10.4 mmHg. The mean mitral valve gradient is 3.0 mmHg with average heart rate of 79 bpm. Tricuspid Valve: The tricuspid valve is not well visualized. Tricuspid valve regurgitation is not demonstrated. No evidence of tricuspid stenosis. Aortic Valve: The aortic  valve has been repaired/replaced. Aortic valve regurgitation is trivial. No aortic stenosis is present. Aortic valve mean gradient measures 18.0 mmHg. Aortic valve peak gradient measures 30.0 mmHg. Aortic valve area, by VTI measures 1.12 cm. 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 Procedure Date: 04/11/16. Pulmonic Valve: The pulmonic valve was not well visualized. Pulmonic valve regurgitation is not visualized. No evidence of pulmonic stenosis. Aorta: The aortic root was not well visualized. Venous: The inferior vena cava is normal in size with greater than 50% respiratory variability, suggesting right atrial pressure of 3 mmHg. IAS/Shunts: No atrial level shunt detected by color flow Doppler.  LEFT VENTRICLE PLAX 2D LVIDd:         5.20 cm  Diastology LVIDs:         3.90 cm  LV e' lateral:   5.98 cm/s LV PW:         1.10 cm  LV E/e' lateral: 17.7 LV IVS:        0.80 cm  LV e' medial:    6.20 cm/s LVOT diam:     2.20 cm  LV E/e' medial:  17.1 LV SV:         56 LV SV Index:   21 LVOT Area:     3.80 cm  RIGHT VENTRICLE RV S prime:     10.80 cm/s TAPSE (M-mode): 2.0 cm LEFT ATRIUM            Index       RIGHT ATRIUM           Index LA diam:      4.20 cm  1.59 cm/m  RA Area:     23.80 cm LA Vol (A4C): 141.0 ml 53.41 ml/m RA Volume:   73.50 ml  27.84 ml/m  AORTIC VALVE AV Area (Vmax):    1.10 cm AV Area (Vmean):   1.12 cm AV Area (VTI):     1.12 cm AV Vmax:           274.00 cm/s AV Vmean:          203.000 cm/s AV VTI:            0.500 m AV Peak Grad:      30.0 mmHg AV Mean Grad:      18.0 mmHg LVOT Vmax:  79.20 cm/s LVOT Vmean:        60.000 cm/s LVOT VTI:          0.147 m LVOT/AV VTI ratio: 0.29 MITRAL VALVE                TRICUSPID VALVE MV Area (PHT): 2.73 cm     TR Peak grad:   15.1 mmHg MV Peak grad:  10.4 mmHg    TR Vmax:        194.00 cm/s MV Mean grad:  3.0 mmHg MV Vmax:       1.61 m/s     SHUNTS MV Vmean:      104.0 cm/s   Systemic VTI:   0.15 m MV Decel Time: 278 msec     Systemic Diam: 2.20 cm MV E velocity: 106.00 cm/s MV A velocity: 149.00 cm/s MV E/A ratio:  0.71 Cherlynn Kaiser MD Electronically signed by Cherlynn Kaiser MD Signature Date/Time: 09/13/2019/9:18:49 PM    Final       Assessment/Plan:  INTERVAL HISTORY: TTE unrevealing   Principal Problem:   Enterococcal bacteremia Active Problems:   Obesity   OBSTRUCTIVE SLEEP APNEA   Essential hypertension, benign   Venous insufficiency of both lower extremities   COPD (chronic obstructive pulmonary disease) (HCC)   CAD (coronary artery disease), native coronary artery   Paroxysmal atrial fibrillation (HCC)   Moderate aortic stenosis   Diabetic neuropathy, painful (HCC)   Sepsis ([place])   Acute lower UTI   History of transmetatarsal amputation of left foot (HCC)   Pressure injury of skin   AICD (automatic cardioverter/defibrillator) present   S/P TAVR (transcatheter aortic valve replacement)   Urinary retention   Phimosis    Tim [name] is a 79 y.o. male with  AMP sensitive enterococcal bacteremia from complicated urinary tract infection who also has an AICD and a TAVR.  Transthoracic echocardiogram is unrevealing but he does need a transesophageal echocardiogram given his enterococcal bacteremia and the presence of his device and artificial valve  He also recently underwent transmetatarsal amputation by Dr. Sharol Given for severe infection in his left foot.  #1 ampicillin sensitive enterococcal bacteremia: I am glad he is getting a transesophageal echocardiogram and hopefully his leads are clean and no evidence of endocarditis.  If this is a case I would treat him in 2 weeks with IV ampicillin.  I would then bring him back to our clinic and check surveillance blood cultures  2.  Transmetatarsal amputation site: Is imperative that this be kept clean and that he manage infections prudently particularly to protect his AICD   LOS: 3 days   Alcide Evener 09/15/2019, 2:45 PM

## 2019-09-15 NOTE — Progress Notes (Signed)
RN instructed to contact [name] to get information on when patient is to get TEE done. Page sent.

## 2019-09-15 NOTE — Progress Notes (Signed)
    [facility] has been requested to perform a transesophageal echocardiogram on Tim [name] for bacteremia.  After careful review of history and examination, the risks and benefits of transesophageal echocardiogram have been explained including risks of esophageal damage, perforation (1:10,000 risk), bleeding, pharyngeal hematoma as well as other potential complications associated with conscious sedation including aspiration, arrhythmia, respiratory failure and death. Alternatives to treatment were discussed, questions were answered. Patient is willing to proceed.   Nancylee Gaines Ninfa Meeker, PA-C  09/15/2019 9:50 AM

## 2019-09-15 NOTE — Evaluation (Addendum)
Physical Therapy Evaluation Patient Details Name: Tim [name] MRN: [id number] DOB: June 03, 1941 Today's Date: 09/15/2019   History of Present Illness  79 yo male admitted with sepsis. Recent L foot transmet amputation by Dr Sharol Given 08/29/19. Hx of NSTEMI, AICD, CAD, COPD, SSS, pacemaker, DM, obesity, gout, chronic pain, Afib, osteomyelitis.  Clinical Impression  On eval, pt required Min assist to stand x 2 from recliner with RW. Pt stood ~15-20 seconds each time. He c/o UE fatigue-pt requested PT adjust walker to shorter height x 2. Educated pt on proper height adjustment for walker (for his height) but he insisted on having walker height lowered more. Unable to attempt any ambulation 2* pt did not have post op shoe in room. Per Dr Jess Barters op note d/c instructions, pt is to be TDWB with post op shoe and assistive device. Pt seemed unaware of weight bearing restrictions. He is 2 weeks post op and he has very likely been ambulating WBAT around his home. Educated pt on weightbearing precautions and demonstrated proper technique on today. Will continue to follow and progress activity as tolerated. Will update d/c recommendations as necessary.   Addendum: Received secure chat msg from Dr Sharol Given- pt can be WBAT now.     Follow Up Recommendations Home health PT;Supervision/Assistance - 24 hour    Equipment Recommendations  None recommended by PT    Recommendations for Other Services       Precautions / Restrictions Precautions Precautions: Fall Precaution Comments: post op shoe L foot Restrictions Weight Bearing Restrictions: Yes Other Position/Activity Restrictions: per Dr Sharol Given op note 08/29/19-TDWB with post op shoe and assistive device Addendum: Per Dr Sharol Given (secure chat), pt can be WBAT now      Mobility  Bed Mobility               General bed mobility comments: oob in recliner (pt chose to say in chair overnight)  Transfers Overall transfer level: Needs assistance Equipment used:  Rolling walker (2 wheeled) Transfers: Sit to/from Stand Sit to Stand: Min assist         General transfer comment: Sit to stand x 2. VCs for WBing precautions.  Ambulation/Gait             General Gait Details: Brief standing trials ~15-20 seconds. Deferred ambulation 2* pt did not have post op shoe in room. Performed pre-gait task: Standing with L hip flexion x 10. Pt c/o bil UE fatigue.  Stairs            Wheelchair Mobility    Modified Rankin (Stroke Patients Only)       Balance Overall balance assessment: Needs assistance         Standing balance support: Bilateral upper extremity supported Standing balance-Leahy Scale: Poor                               Pertinent Vitals/Pain Pain Assessment: No/denies pain    Home Living Family/patient expects to be discharged to:: Private residence Living Arrangements: Spouse/significant other   Type of Home: House Home Access: Stairs to enter Entrance Stairs-Rails: Right Entrance Stairs-Number of Steps: 4 Home Layout: Able to live on main level with bedroom/bathroom Home Equipment: Walker - 2 wheels;Wheelchair - manual;Cane - single point;Walker - 4 wheels      Prior Function Level of Independence: Needs assistance   Gait / Transfers Assistance Needed: using RW for ambulation     Comments: using RW since  most recent foot surgery 2/26     Hand Dominance        Extremity/Trunk Assessment   Upper Extremity Assessment Upper Extremity Assessment: Overall WFL for tasks assessed    Lower Extremity Assessment Lower Extremity Assessment: Generalized weakness    Cervical / Trunk Assessment Cervical / Trunk Assessment: Normal  Communication   Communication: HOH  Cognition Arousal/Alertness: Awake/alert Behavior During Therapy: WFL for tasks assessed/performed Overall Cognitive Status: Within Functional Limits for tasks assessed                                         General Comments      Exercises General Exercises - Lower Extremity Hip Flexion/Marching: AROM;Left;10 reps;Standing   Assessment/Plan    PT Assessment Patient needs continued PT services  PT Problem List Decreased strength;Decreased mobility;Decreased balance;Decreased activity tolerance;Decreased knowledge of use of DME;Decreased knowledge of precautions       PT Treatment Interventions DME instruction;Gait training;Therapeutic activities;Therapeutic exercise;Patient/family education;Balance training;Functional mobility training    PT Goals (Current goals can be found in the Care Plan section)  Acute Rehab PT Goals Patient Stated Goal: none stated PT Goal Formulation: With patient Time For Goal Achievement: 09/29/19 Potential to Achieve Goals: Good    Frequency Min 3X/week   Barriers to discharge        Co-evaluation               AM-PAC PT "6 Clicks" Mobility  Outcome Measure Help needed turning from your back to your side while in a flat bed without using bedrails?: A Lot Help needed moving from lying on your back to sitting on the side of a flat bed without using bedrails?: A Lot Help needed moving to and from a bed to a chair (including a wheelchair)?: A Little Help needed standing up from a chair using your arms (e.g., wheelchair or bedside chair)?: A Little Help needed to walk in hospital room?: A Lot Help needed climbing 3-5 steps with a railing? : A Lot 6 Click Score: 14    End of Session   Activity Tolerance: Patient tolerated treatment well Patient left: in chair;with call bell/phone within reach Nurse Communication: Weight bearing status(need for post op shoe for ambulation) PT Visit Diagnosis: Muscle weakness (generalized) (M62.81);Difficulty in walking, not elsewhere classified (R26.2)    Time: 8177-1165 PT Time Calculation (min) (ACUTE ONLY): 23 min   Charges:   PT Evaluation $PT Eval Moderate Complexity: 1 Mod             Hope Brandenburger  P, PT Acute Rehabilitation

## 2019-09-15 NOTE — TOC Progression Note (Signed)
Transition of Care [facility]) - Progression Note    Patient Details  Name: Tim [name] MRN: [id number] Date of Birth: 02-13-1941  [facility] CM/SW Contact  Nimah Uphoff, Juliann Pulse, RN Phone Number: 09/15/2019, 4:04 PM  Clinical Narrative: For tee in am-await recc from ID. May need long term iv abx. HHPT-will await if needs HHRN to check w/agencies.      Expected Discharge Plan: [place] Barriers to Discharge: Continued Medical Work up  Expected Discharge Plan and Services Expected Discharge Plan: [place]   Discharge Planning Services: CM Consult   Living arrangements for the past 2 months: Single Family Home Expected Discharge Date: (unknown)                                     Social Determinants of Health (SDOH) Interventions    Readmission Risk Interventions No flowsheet data found.

## 2019-09-15 NOTE — Progress Notes (Signed)
PROGRESS NOTE   Tim [name]  TFT:[id number]    DOB: 1940/09/20    DOA: 09/11/2019  PCP: Tammi Sou, MD   I have briefly reviewed patients previous medical records in [facility].  Chief Complaint:   Chief Complaint  Patient presents with  . Code Sepsis    Brief Narrative:  79 year old married male, ambulates with the help of a cane, extensive PMH including CAD status post PCI, cardiomyopathy, chronic combined CHF, TAVR, A. fib on Eliquis currently on hold for upcoming procedure, PPM/AICD, follows with cardiologist with [facility], type II DM with peripheral neuropathy, chronic pain for which he sees pain MD, OSA on nightly CPAP, COPD/asthma not on home oxygen, HTN, GERD, HLD, s/p left transmetatarsal amputation for osteomyelitis by Dr. Sharol Given on 08/29/2019, seen in ED on 09/09/2019 for decreased urination and pain, seen by Dr. Tresa Moore, urology, diagnosed with balanitis, urinary catheter placed and supposed to have a procedure next week presented to the ED with complaints of high fevers (104 F), chills and confusion. Admitted for sepsis due to enterococcal bacteremia complicating enterococcal UTI, fungal balanitis. Treated with IV fluids and empiric antibiotics.  Orthopedics, urology and ID consulted.  Improved.  Transferred from stepdown to telemetry on 3/13.  Continues to improve.  TEE hopefully for 3/16.  Assessment & Plan:  Principal Problem:   Enterococcal bacteremia Active Problems:   Obesity   OBSTRUCTIVE SLEEP APNEA   Essential hypertension, benign   Venous insufficiency of both lower extremities   COPD (chronic obstructive pulmonary disease) (HCC)   CAD (coronary artery disease), native coronary artery   Paroxysmal atrial fibrillation (HCC)   Moderate aortic stenosis   Diabetic neuropathy, painful (HCC)   Sepsis ([place])   Acute lower UTI   History of transmetatarsal amputation of left foot (HCC)   Pressure injury of skin   AICD (automatic  cardioverter/defibrillator) present   S/P TAVR (transcatheter aortic valve replacement)   Urinary retention   Phimosis   Sepsis due to enterococcal bacteremia complicating enterococcal UTI and recent Foley catheter placement: Recent urine culture confirms Enterococcus sensitive to vancomycin and ampicillin.  Blood culture x2 and urine culture from 3/11 confirm Enterococcus faecalis.  Patient allergic to vancomycin.  This admission he received a dose of daptomycin x1 in ED, after communicating with ID he was then placed on linezolid and Zosyn.  Cautious with IV fluids on admission due to history of CHF.  Lactate normal.  Foley catheter was blocked and changed on admission by urology.  Based on automatic consultation for enterococcal bacteremia, ID seen and have narrowed antibiotics to IV ampicillin.  Given history of TAVR, follow TTE for vegetations and surveillance blood cultures from 3/13.  TTE: Poor quality to detect vegetations, recommended TEE-await ID follow-up if we need to do this versus treat empirically.  Low-grade fever/100 F overnight.  Surveillance blood cultures x2 from 3/13: Negative to date.  Sepsis physiology resolved. I discussed in detail with Dr. Megan Salon, ID on 3/14.  Given that patient has TAVR and AICD, he recommends proceeding with TEE to rule out vegetations.  I had consulted cardiology yesterday for potential TEE today but for some reason it did not make it to their list.  Discussed with cardiology again and hopefully for TEE 3/16.  Overall doing better.  Sepsis resolved.  Acute urinary retention/fungal balanitis/phimosis: Urology consultation and follow-up appreciated.  Foley catheter changed this admission on 3/12.  Recommending fluconazole for balanitis.  Anticoagulation was held until 3/14 due to hematuria and  possible need for instrumentation.  After Urology clearance, resumed prior home dose of Eliquis on 3/14, mild hematuria in bag but monitor closely.  Outpatient follow-up  with Dr. Tresa Moore, Urology.  Continue Proscar and Oxybutynin.  S/p recent left TMA for osteomyelitis: Underwent TMA by Dr. Sharol Given on 08/29/2019. Dr. Farley Ly input appreciated.  Now has a stump shrinker.  No abscess and x-ray without osteomyelitis.  At this time after reviewing Ortho input and discussing with ID, this is not felt to be a source of infection or cellulitis.  PT evaluated and recommend home health PT at discharge.  Chronic combined systolic and diastolic CHF/PPM/AICD/TAVR: Follows with Dr. Radford Pax, Cardiology at [facility]. LVEF in 2019 40-45%. S/p PPM and AICD.  Continue beta-blockers, Entresto and Imdur.   Essential hypertension: Continue beta-blockers, Entresto and Imdur.  Paroxysmal atrial fibrillation: Currently in sinus rhythm.  Apixaban was on hold since 3/9.  , resumed apixaban 3/14.  Continue beta-blockers.  Anemia: Suspect chronic disease. Stable.  Thrombocytopenia: Due to acute infection.  Improving.  Continue to follow CBC daily.  Type II DM with peripheral neuropathy: Continue SSI and adjust insulins as needed. Continue gabapentin for significant neuropathy pain.  Reasonable inpatient control.  CAD s/p PCI: No anginal symptoms.  Continue aspirin.  Body mass index is 44.91 kg/m. Morbid obesity  Mild transaminitis: Unclear etiology.  No GI symptoms.  May be related to sepsis.  If worsen then may consider RUQ ultrasound.  LFTs slowly trending drawn, continue to follow.  OSA on CPAP: Continue.  ??  Hallucinations: History unclear.  Monitor closely.  Resolved without recurrence.  Bilateral lower extremity chronic venous stasis  Hypokalemia: Replaced today.  Follow BMP.  Dyslipidemia: Continue statins and Niaspan.   DVT prophylaxis: SCDs.  Apixaban. Code Status: Full Family Communication: Discussed in detail with patient spouse via phone on 3/14.  None at bedside today.   Disposition:  . Patient came from: Home.           . Anticipated d/c place: Home.   Requested PT evaluation. . Barriers to d/c: Pending TEE to rule out vegetations and final recommendations by ID regarding need for PICC line placement and prolonged IV antibiotics for enterococcal bacteremia.   Consultants:   Urology Orthopedics ID  Procedures:   Has indwelling Foley catheter, changed by urology on 3/12  Antimicrobials:   Zosyn, linezolid-discontinued.  Daptomycin x1 dose. IV ampicillin.   Subjective:  Slept on reclining chair all night.  Has a air overlay mattress which is uncomfortable and hence did not sleep in bed.  Asking for a regular bed.  Denies any other complaints.  No dyspnea or pain.  As per RN, no acute issues reported.  Objective:   Vitals:   09/15/19 0459 09/15/19 1050 09/15/19 1132 09/15/19 1233  BP: 128/62 (!) 154/60 (!) 154/60 (!) 164/60  Pulse: 71 77 76 75  Resp: 18     Temp: 98.2 F (36.8 C)   98.2 F (36.8 C)  TempSrc: Oral   Oral  SpO2: 96%   96%  Weight:      Height:        General exam: Elderly male, moderately built and morbidly obese sitting comfortably in reclining chair with nasal CPAP still on this morning. Respiratory system: Clear to auscultation.  No increased work of breathing. Cardiovascular system: S1 and S2 heard, RRR.  No JVD, murmurs or gallops.  Trace bilateral leg edema.  Telemetry personally reviewed: Appears to be in atrial sensed, V paced rhythm.  Stable without change. Gastrointestinal system: Abdomen is nondistended/obese, soft and nontender. No organomegaly or masses felt. Normal bowel sounds heard. GU: As examined on 3/12: Mild erythema around his scrotal sac and penis which is difficult to visualize, buried in his scrotum.  Now has new Foley catheter with somewhat brown-colored urine suggestive of some hematuria but urine in tubing is straw-colored without gross hematuria. Central nervous system: Alert and oriented. No focal neurological deficits. Extremities: Symmetric 5 x 5 power. Trace bilateral leg edema.  Chronic scarring and hyperpigmentation of bilateral lower legs consistent with chronic venous stasis. Left TMA site now has a stump shrinker Skin: No rashes, lesions or ulcers Psychiatry: Judgement and insight appear normal. Mood & affect appropriate.     Data Reviewed:   I have personally reviewed following labs and imaging studies   CBC: Recent Labs  Lab 09/11/19 2343 09/11/19 2343 09/12/19 0454 09/12/19 0454 09/13/19 0633 09/14/19 0529 09/15/19 0452  WBC 13.9*   < > 12.0*   < > 5.9 6.0 4.4  NEUTROABS 11.6*  --  10.0*  --   --   --   --   HGB 10.8*   < > 10.1*   < > 10.1* 9.8* 9.4*  HCT 33.9*   < > 31.3*   < > 30.9* 28.8* 29.3*  MCV 95.8   < > 95.1   < > 93.4 90.0 93.6  PLT 127*   < > 117*   < > 109* 86* 124*   < > = values in this interval not displayed.    Basic Metabolic Panel: Recent Labs  Lab 09/13/19 0633 09/14/19 0529 09/15/19 0452  NA 138 139 138  K 3.3* 4.1 3.3*  CL 104 105 104  CO2 25 26 25   GLUCOSE 108* 110* 132*  BUN 15 16 16   CREATININE 0.80 0.80 0.76  CALCIUM 8.4* 8.1* 8.2*    Liver Function Tests: Recent Labs  Lab 09/13/19 0633 09/14/19 0529 09/15/19 0452  AST 268* 178* 105*  ALT 183* 153* 123*  ALKPHOS 181* 137* 134*  BILITOT 1.0 1.1 0.8  PROT 5.7* 5.3* 5.5*  ALBUMIN 2.9* 2.4* 2.6*    CBG: Recent Labs  Lab 09/14/19 1630 09/15/19 0738 09/15/19 1155  GLUCAP 125* 105* 160*    Microbiology Studies:   Recent Results (from the past 240 hour(s))  Urine culture     Status: Abnormal   Collection Time: 09/09/19  5:32 PM   Specimen: Urine, Clean Catch  Result Value Ref Range Status   Specimen Description URINE, CLEAN CATCH  Final   Special Requests   Final    NONE Performed at [facility], [place] [address] [place], [place] [address]    Culture (A)  Final    >=100,000 COLONIES/mL ENTEROCOCCUS FAECALIS >=100,000 COLONIES/mL AEROCOCCUS URINAE    Report Status 09/12/2019 FINAL  Final   Organism ID, Bacteria ENTEROCOCCUS  FAECALIS (A)  Final      Susceptibility   Enterococcus faecalis - MIC*    AMPICILLIN <=2 SENSITIVE Sensitive     NITROFURANTOIN <=16 SENSITIVE Sensitive     VANCOMYCIN 1 SENSITIVE Sensitive     * >=100,000 COLONIES/mL ENTEROCOCCUS FAECALIS  Blood Culture (routine x 2)     Status: Abnormal (Preliminary result)   Collection Time: 09/11/19 11:38 PM   Specimen: BLOOD  Result Value Ref Range Status   Specimen Description   Final    BLOOD RIGHT ANTECUBITAL Performed at [facility] [address] [place],  [place] [address]    Special Requests   Final    BOTTLES DRAWN AEROBIC AND ANAEROBIC Blood Culture adequate volume Performed at [place] [address], [place], [place] [address]    Culture  Setup Time   Final    IN BOTH AEROBIC AND ANAEROBIC BOTTLES GRAM POSITIVE COCCI IN CHAINS CRITICAL RESULT CALLED TO, READ BACK BY AND VERIFIED WITHGuadlupe [place] [facility] 09/12/19 1712 JDW Performed at [facility], [address] [place], [place] [address]    Culture ENTEROCOCCUS FAECALIS (A)  Final   Report Status PENDING  Incomplete  Urine culture     Status: Abnormal   Collection Time: 09/11/19 11:38 PM   Specimen: In/Out Cath Urine  Result Value Ref Range Status   Specimen Description   Final    IN/OUT CATH URINE Performed at [place] [address], [place], [place] [address]    Special Requests   Final    NONE Performed at [facility], [place] [address] [place], [place] [address]    Culture >=100,000 COLONIES/mL ENTEROCOCCUS FAECALIS (A)  Final   Report Status 09/13/2019 FINAL  Final   Organism ID, Bacteria ENTEROCOCCUS FAECALIS (A)  Final      Susceptibility   Enterococcus faecalis - MIC*    AMPICILLIN <=2 SENSITIVE Sensitive     NITROFURANTOIN <=16 SENSITIVE Sensitive     VANCOMYCIN 1 SENSITIVE Sensitive     * >=100,000 COLONIES/mL ENTEROCOCCUS FAECALIS  Blood Culture (routine x 2)      Status: Abnormal (Preliminary result)   Collection Time: 09/11/19 11:43 PM   Specimen: BLOOD RIGHT FOREARM  Result Value Ref Range Status   Specimen Description   Final    BLOOD RIGHT FOREARM Performed at [facility], [place] [address], [place], [place] [address]    Special Requests   Final    BOTTLES DRAWN AEROBIC ONLY Blood Culture results may not be optimal due to an inadequate volume of blood received in culture bottles Performed at [place] [address] [place], [place] [address]    Culture  Setup Time   Final    AEROBIC BOTTLE ONLY GRAM POSITIVE COCCI IN CHAINS CRITICAL RESULT CALLED TO, READ BACK BY AND VERIFIED WITHGuadlupe Spanish [facility] 09/12/19 2100 Performed at [facility], [place] [address] [place],  [address]    Culture ENTEROCOCCUS FAECALIS (A)  Final   Report Status PENDING  Incomplete  Blood Culture ID Panel (Reflexed)     Status: Abnormal   Collection Time: 09/11/19 11:43 PM  Result Value Ref Range Status   Enterococcus species DETECTED (A) NOT DETECTED Final    Comment: CRITICAL RESULT CALLED TO, READ BACK BY AND VERIFIED WITH: N GLOGOVAC PHARMD 09/12/19 2100 JDW    Vancomycin resistance NOT DETECTED NOT DETECTED Final   Listeria monocytogenes NOT DETECTED NOT DETECTED Final   Staphylococcus species NOT DETECTED NOT DETECTED Final   Staphylococcus aureus (BCID) NOT DETECTED NOT DETECTED Final   Streptococcus species NOT DETECTED NOT DETECTED Final   Streptococcus agalactiae NOT DETECTED NOT DETECTED Final   Streptococcus pneumoniae NOT DETECTED NOT DETECTED Final   Streptococcus pyogenes NOT DETECTED NOT DETECTED Final   Acinetobacter baumannii NOT DETECTED NOT DETECTED Final   Enterobacteriaceae species NOT DETECTED NOT DETECTED Final   Enterobacter cloacae complex NOT DETECTED NOT DETECTED Final   Escherichia coli NOT DETECTED NOT DETECTED Final   Klebsiella oxytoca NOT DETECTED NOT DETECTED  Final   Klebsiella pneumoniae  NOT DETECTED NOT DETECTED Final   Proteus species NOT DETECTED NOT DETECTED Final   Serratia marcescens NOT DETECTED NOT DETECTED Final   Haemophilus influenzae NOT DETECTED NOT DETECTED Final   Neisseria meningitidis NOT DETECTED NOT DETECTED Final   Pseudomonas aeruginosa NOT DETECTED NOT DETECTED Final   Candida albicans NOT DETECTED NOT DETECTED Final   Candida glabrata NOT DETECTED NOT DETECTED Final   Candida krusei NOT DETECTED NOT DETECTED Final   Candida parapsilosis NOT DETECTED NOT DETECTED Final   Candida tropicalis NOT DETECTED NOT DETECTED Final    Comment: Performed at [facility], [place] [address], [place], [place] [address]  SARS CORONAVIRUS 2 (TAT 6-24 HRS) Nasopharyngeal Nasopharyngeal Swab     Status: None   Collection Time: 09/12/19  1:44 AM   Specimen: Nasopharyngeal Swab  Result Value Ref Range Status   SARS Coronavirus 2 NEGATIVE NEGATIVE Final    Comment: (NOTE) SARS-CoV-2 target nucleic acids are NOT DETECTED. The SARS-CoV-2 RNA is generally detectable in upper and lower respiratory specimens during the acute phase of infection. Negative results do not preclude SARS-CoV-2 infection, do not rule out co-infections with other pathogens, and should not be used as the sole basis for treatment or other patient management decisions. Negative results must be combined with clinical observations, patient history, and epidemiological information. The expected result is Negative. Fact Sheet for Patients: SugarRoll.be Fact Sheet for Healthcare Providers: https://www.woods-mathews.com/ This test is not yet approved or cleared by the [place] FDA and  has been authorized for detection and/or diagnosis of SARS-CoV-2 by FDA under an Emergency Use Authorization (EUA). This EUA will remain  in effect (meaning this test can be used) for the duration of the COVID-19 declaration under Section 56 4(b)(1) of the Act, 21  U.S.C. section 360bbb-3(b)(1), unless the authorization is terminated or revoked sooner. Performed at [facility], [place] [address], [place], [place] [address]   MRSA PCR Screening     Status: None   Collection Time: 09/12/19 10:03 PM   Specimen: Nasal Mucosa; Nasopharyngeal  Result Value Ref Range Status   MRSA by PCR NEGATIVE NEGATIVE Final    Comment:        The GeneXpert MRSA Assay (FDA approved for NASAL specimens only), is one component of a comprehensive MRSA colonization surveillance program. It is not intended to diagnose MRSA infection nor to guide or monitor treatment for MRSA infections. Performed at [facility], [place] [address], [place], [place] [address]   Culture, blood (routine x 2)     Status: None (Preliminary result)   Collection Time: 09/13/19 11:43 AM   Specimen: BLOOD  Result Value Ref Range Status   Specimen Description BLOOD RIGHT HAND  Final   Special Requests   Final    BOTTLES DRAWN AEROBIC AND ANAEROBIC Blood Culture adequate volume Performed at [place] [address] [place], [place] [address]    Culture NO GROWTH 2 DAYS  Final   Report Status PENDING  Incomplete  Culture, blood (routine x 2)     Status: None (Preliminary result)   Collection Time: 09/13/19 11:55 AM   Specimen: BLOOD  Result Value Ref Range Status   Specimen Description BLOOD RIGHT ANTECUBITAL  Final   Special Requests   Final    BOTTLES DRAWN AEROBIC AND ANAEROBIC Blood Culture adequate volume Performed at [facility] [address], [place], [place] [address]  Culture NO GROWTH 2 DAYS  Final   Report Status PENDING  Incomplete     Radiology Studies:  No results found.   Scheduled Meds:   . allopurinol  300 mg Oral Daily  . apixaban  5 mg Oral BID  . aspirin EC  81 mg Oral Daily  . chlorhexidine  15 mL Mouth Rinse BID  . Chlorhexidine Gluconate Cloth  6 each Topical Daily  . cyclobenzaprine   5 mg Oral QHS  . ezetimibe  10 mg Oral Daily  . finasteride  5 mg Oral Daily  . fluconazole  100 mg Oral Daily  . gabapentin  800 mg Oral QID  . insulin aspart  0-9 Units Subcutaneous TID WC  . isosorbide mononitrate  60 mg Oral Daily  . mouth rinse  15 mL Mouth Rinse q12n4p  . metoprolol succinate  50 mg Oral Daily  . niacin  1,000 mg Oral BID  . pantoprazole  40 mg Oral Daily  . rosuvastatin  40 mg Oral Daily  . sacubitril-valsartan  1 tablet Oral BID    Continuous Infusions:   . [START ON 09/16/2019] sodium chloride    . ampicillin (OMNIPEN) IV 2 g (09/15/19 0829)  . potassium chloride 10 mEq (09/15/19 1235)     LOS: 3 days     Vernell Leep, MD, [place], [facility]. [facility]    To contact the attending provider between 7A-7P or the covering provider during after hours 7P-7A, please log into the web site [email or web address] and access using universal [place] password for that web site. If you do not have the password, please call the hospital operator.  09/15/2019, 1:06 PM

## 2019-09-15 NOTE — Care Management Important Message (Signed)
Important Message  Patient Details IM Letter given to Dessa Phi RN Case Manager to present to the Patient Name: Tim [name] MRN: [id number] Date of Birth: 05-28-1941   Medicare Important Message Given:  Yes     Kerin Salen 09/15/2019, 12:30 PM

## 2019-09-16 ENCOUNTER — Inpatient Hospital Stay (HOSPITAL_COMMUNITY): Payer: Medicare Other

## 2019-09-16 ENCOUNTER — Encounter (HOSPITAL_COMMUNITY)
Admission: EM | Disposition: A | Discharge status: Home Health | Payer: Self-pay | Source: Home / Self Care | Attending: Internal Medicine

## 2019-09-16 ENCOUNTER — Inpatient Hospital Stay (HOSPITAL_COMMUNITY): Payer: Medicare Other | Admitting: Anesthesiology

## 2019-09-16 ENCOUNTER — Encounter (HOSPITAL_COMMUNITY): Payer: Self-pay | Admitting: Internal Medicine

## 2019-09-16 DIAGNOSIS — I340 Nonrheumatic mitral (valve) insufficiency: Secondary | ICD-10-CM

## 2019-09-16 DIAGNOSIS — R7881 Bacteremia: Secondary | ICD-10-CM

## 2019-09-16 HISTORY — PX: TEE WITHOUT CARDIOVERSION: SHX5443

## 2019-09-16 LAB — COMPREHENSIVE METABOLIC PANEL
ALT: 99 U/L — ABNORMAL HIGH (ref 0–44)
AST: 90 U/L — ABNORMAL HIGH (ref 15–41)
Albumin: 2.5 g/dL — ABNORMAL LOW (ref 3.5–5.0)
Alkaline Phosphatase: 111 U/L (ref 38–126)
Anion gap: 7 (ref 5–15)
BUN: 13 mg/dL (ref 8–23)
CO2: 23 mmol/L (ref 22–32)
Calcium: 8.2 mg/dL — ABNORMAL LOW (ref 8.9–10.3)
Chloride: 107 mmol/L (ref 98–111)
Creatinine, Ser: 0.81 mg/dL (ref 0.61–1.24)
GFR calc Af Amer: >60 mL/min (ref >60)
GFR calc non Af Amer: >60 mL/min (ref >60)
Glucose, Bld: 130 mg/dL — ABNORMAL HIGH (ref 70–99)
Potassium: 4.5 mmol/L (ref 3.5–5.1)
Sodium: 137 mmol/L (ref 135–145)
Total Bilirubin: 0.5 mg/dL (ref 0.3–1.2)
Total Protein: 5.3 g/dL — ABNORMAL LOW (ref 6.5–8.1)

## 2019-09-16 LAB — GLUCOSE, CAPILLARY
Glucose-Capillary: 128 mg/dL — ABNORMAL HIGH (ref 70–99)
Glucose-Capillary: 119 mg/dL — ABNORMAL HIGH (ref 70–99)
Glucose-Capillary: 119 mg/dL — ABNORMAL HIGH (ref 70–99)

## 2019-09-16 LAB — CBC
HCT: 23.6 % — ABNORMAL LOW (ref 39.0–52.0)
Hemoglobin: 8.4 g/dL — ABNORMAL LOW (ref 13.0–17.0)
MCH: 31 pg (ref 26.0–34.0)
MCHC: 35.6 g/dL (ref 30.0–36.0)
MCV: 87.1 fL (ref 80.0–100.0)
Platelets: 117 10*3/uL — ABNORMAL LOW (ref 150–400)
RBC: 2.71 MIL/uL — ABNORMAL LOW (ref 4.22–5.81)
RDW: 14.1 % (ref 11.5–15.5)
WBC: 4.1 10*3/uL (ref 4.0–10.5)
nRBC: 0 % (ref 0.0–0.2)

## 2019-09-16 SURGERY — ECHOCARDIOGRAM, TRANSESOPHAGEAL
Anesthesia: Monitor Anesthesia Care

## 2019-09-16 MED ORDER — DEXMEDETOMIDINE HCL 200 MCG/2ML IV SOLN
INTRAVENOUS | Status: DC | PRN
  Administered 2019-09-16: 8 ug via INTRAVENOUS
  Administered 2019-09-16: 12 ug via INTRAVENOUS

## 2019-09-16 MED ORDER — PHENYLEPHRINE 40 MCG/ML (10ML) SYRINGE FOR IV PUSH (FOR BLOOD PRESSURE SUPPORT)
PREFILLED_SYRINGE | INTRAVENOUS | Status: DC | PRN
  Administered 2019-09-16 (×5): 80 ug via INTRAVENOUS

## 2019-09-16 MED ORDER — PROPOFOL 10 MG/ML IV BOLUS
INTRAVENOUS | Status: DC | PRN
  Administered 2019-09-16: 75 ug/kg/min via INTRAVENOUS
  Administered 2019-09-16: 20 mg via INTRAVENOUS

## 2019-09-16 MED ORDER — BUTAMBEN-TETRACAINE-BENZOCAINE 2-2-14 % EX AERO
INHALATION_SPRAY | CUTANEOUS | Status: DC | PRN
  Administered 2019-09-16: 2 via TOPICAL

## 2019-09-16 NOTE — Progress Notes (Signed)
PROGRESS NOTE    Tim [name]  HMC:[id number] DOB: 1940-12-03 DOA: 09/11/2019 PCP: Tammi Sou, MD   Brief Narrative:  79 year old with history of CAD status post PCI, combined CHF, cardiomyopathy, TAVR, A. fib on Eliquis, AICD/pacemaker in place, DM 2 with peripheral neuropathy, chronic pain, OSA on CPAP, COPD/asthma, HTN, GERD, HLD status post recent left TMA for osteomyelitis 2/26 admitted to the ER for balanitis, CAUTI complicated by enterococcal bacteremia.  Orthopedic, neurology, cardiology and ID consulted.  TEE is negative for any evidence of endocarditis.   Assessment & Plan:   Principal Problem:   Enterococcal bacteremia Active Problems:   Obesity   OBSTRUCTIVE SLEEP APNEA   Essential hypertension, benign   Venous insufficiency of both lower extremities   COPD (chronic obstructive pulmonary disease) (HCC)   CAD (coronary artery disease), native coronary artery   Paroxysmal atrial fibrillation (HCC)   Moderate aortic stenosis   Diabetic neuropathy, painful (HCC)   Sepsis ([facility])   Enterococcus UTI   History of transmetatarsal amputation of left foot (HCC)   Pressure injury of skin   AICD (automatic cardioverter/defibrillator) present   S/P TAVR (transcatheter aortic valve replacement)   Urinary retention   Phimosis   ICD (implantable cardioverter-defibrillator) battery depletion  Sepsis secondary to enterococcal bacteremia CAUTI Fungal balanitis/phimosis with acute urinary retention -Continue to follow culture data.  Initial cultures grew Enterococcus faecalis. -Initially on daptomycin and then transition to linezolid and Zosyn, now on IV ampicillin.  If TEE is negative, will plan to continue IV ampicillin for at least 2 weeks. -Foley catheter changed. -Given cardiac history with TAVR/AICD -ID recommending TEE= Neg for Endocarditis  -Continue fluconazole for balanitis -Continue Proscar, oxybutynin -Outpatient urology follow-up.  Mild  transaminitis -Improving.  Holding off on RUQ ultrasound  Bilateral mild hearing deficit -Tells me this sounds a little muffled at times since coming to the hospital.  I reviewed his medication list and also discussed the case with pharmacist, no other medications standout that would cause hearing issues.  He is taking 81 mg aspirin daily but no overdose. Perhaps only to go to outpatient Audiologist or ENT.  Status post left TMA for osteomyelitis 08/29/2019 -Stump shrinker in place.  Ortho does not think this is the source of infection -Continue supportive care.  Seen by PT-home health  History of CAD status post PCI -Continue aspirin.  Currently chest pain-free.  Combined CHF, EF 45% status post PPM/AICD -Appears to be euvolemic at this time.  Follows with cardiology at [facility] isosorbide mononitrate, Entresto and beta-blocker  Paroxysmal atrial fibrillation -Eliquis on hold anticipation for procedure  DM 2 with peripheral neuropathy -On gabapentin.  Insulin sliding scale Accu-Chek  History of OSA on CPAP  Hyperlipidemia -Continue home regimen-Crestor.   DVT prophylaxis: Eliquis on hold Code Status: Full code Family Communication:  Wife at Bedside  Disposition Plan:   Patient From= Home   Patient Anticipated D/C place= Home   Barriers= discharge home once cleared by infectious disease   Subjective: Tolerated TEE.  Tells me since his hospital admission he has been having some hearing deficit where he has trouble understanding clear speech.  No other complaints.  No recent medication changes.  He has been on aspirin 81 mg daily for some time.  Review of Systems Otherwise negative except as per HPI, including: General: Denies fever, chills, night sweats or unintended weight loss. Resp: Denies cough, wheezing, shortness of breath. Cardiac: Denies chest pain, palpitations, orthopnea, paroxysmal nocturnal dyspnea. GI: Denies abdominal pain,  nausea, vomiting, diarrhea  or constipation GU: Denies dysuria, frequency, hesitancy or incontinence MS: Denies muscle aches, joint pain or swelling Neuro: Denies headache, neurologic deficits (focal weakness, numbness, tingling), abnormal gait Psych: Denies anxiety, depression, SI/HI/AVH Skin: Denies new rashes or lesions ID: Denies sick contacts, exotic exposures, travel  Examination:  General exam: Appears calm and comfortable  Respiratory system: Clear to auscultation. Respiratory effort normal. Cardiovascular system: S1 & S2 heard, RRR. No JVD, murmurs, rubs, gallops or clicks. No pedal edema. Gastrointestinal system: Abdomen is nondistended, soft and nontender. No organomegaly or masses felt. Normal bowel sounds heard. Central nervous system: Alert and oriented. No focal neurological deficits. Extremities: Symmetric 5 x 5 power. Skin: No rashes, lesions or ulcers Psychiatry: Judgement and insight appear normal. Mood & affect appropriate.     Objective: Vitals:   09/16/19 0157 09/16/19 0500 09/16/19 0616 09/16/19 0621  BP: 140/60   133/63  Pulse: 74   82  Resp: 18   18  Temp: 97.8 F (36.6 C)   98 F (36.7 C)  TempSrc: Oral   Oral  SpO2: 99%   100%  Weight:  (!) 155.1 kg (!) 154.4 kg   Height:        Intake/Output Summary (Last 24 hours) at 09/16/2019 0751 Last data filed at 09/16/2019 0616 Gross per 24 hour  Intake 1782.62 ml  Output 1500 ml  Net 282.62 ml   Filed Weights   09/13/19 0613 09/16/19 0500 09/16/19 0616  Weight: (!) 150.2 kg (!) 155.1 kg (!) 154.4 kg     Data Reviewed:   CBC: Recent Labs  Lab 09/11/19 2343 09/11/19 2343 09/12/19 0454 09/13/19 8295 09/14/19 0529 09/15/19 0452 09/16/19 0607  WBC 13.9*   < > 12.0* 5.9 6.0 4.4 4.1  NEUTROABS 11.6*  --  10.0*  --   --   --   --   HGB 10.8*   < > 10.1* 10.1* 9.8* 9.4* 8.4*  HCT 33.9*   < > 31.3* 30.9* 28.8* 29.3* 23.6*  MCV 95.8   < > 95.1 93.4 90.0 93.6 87.1  PLT 127*   < > 117* 109* 86* 124* 117*   < > = values in  this interval not displayed.   Basic Metabolic Panel: Recent Labs  Lab 09/12/19 0454 09/13/19 0633 09/14/19 0529 09/15/19 0452 09/16/19 0607  NA 138 138 139 138 137  K 3.7 3.3* 4.1 3.3* 4.5  CL 103 104 105 104 107  CO2 24 25 26 25 23   GLUCOSE 141* 108* 110* 132* 130*  BUN 19 15 16 16 13   CREATININE 1.02 0.80 0.80 0.76 0.81  CALCIUM 8.2* 8.4* 8.1* 8.2* 8.2*   GFR: Estimated Creatinine Clearance: 113.3 mL/min (by C-G formula based on SCr of 0.81 mg/dL). Liver Function Tests: Recent Labs  Lab 09/12/19 0454 09/13/19 6213 09/14/19 0529 09/15/19 0452 09/16/19 0607  AST 110* 268* 178* 105* 90*  ALT 90* 183* 153* 123* 99*  ALKPHOS 120 181* 137* 134* 111  BILITOT 1.0 1.0 1.1 0.8 0.5  PROT 5.8* 5.7* 5.3* 5.5* 5.3*  ALBUMIN 2.8* 2.9* 2.4* 2.6* 2.5*   No results for input(s): LIPASE, AMYLASE in the last 168 hours. No results for input(s): AMMONIA in the last 168 hours. Coagulation Profile: Recent Labs  Lab 09/11/19 2343  INR 1.2   Cardiac Enzymes: No results for input(s): CKTOTAL, CKMB, CKMBINDEX, TROPONINI in the last 168 hours. BNP (last 3 results) No results for input(s): PROBNP in the last 8760 hours. HbA1C: No  results for input(s): HGBA1C in the last 72 hours. CBG: Recent Labs  Lab 09/14/19 1630 09/15/19 0738 09/15/19 1155 09/15/19 1633 09/15/19 2124  GLUCAP 125* 105* 160* 118* 139*   Lipid Profile: No results for input(s): CHOL, HDL, LDLCALC, TRIG, CHOLHDL, LDLDIRECT in the last 72 hours. Thyroid Function Tests: No results for input(s): TSH, T4TOTAL, FREET4, T3FREE, THYROIDAB in the last 72 hours. Anemia Panel: No results for input(s): VITAMINB12, FOLATE, FERRITIN, TIBC, IRON, RETICCTPCT in the last 72 hours. Sepsis Labs: Recent Labs  Lab 09/11/19 2343 09/12/19 0001 09/12/19 0133 09/12/19 0508  PROCALCITON 0.21  --   --   --   LATICACIDVEN  --  1.3 0.9 0.7    Recent Results (from the past 240 hour(s))  Urine culture     Status: Abnormal    Collection Time: 09/09/19  5:32 PM   Specimen: Urine, Clean Catch  Result Value Ref Range Status   Specimen Description URINE, CLEAN CATCH  Final   Special Requests   Final    NONE Performed at [facility], [place] [address], [place], [place] [address]    Culture (A)  Final    >=100,000 COLONIES/mL ENTEROCOCCUS FAECALIS >=100,000 COLONIES/mL AEROCOCCUS URINAE    Report Status 09/12/2019 FINAL  Final   Organism ID, Bacteria ENTEROCOCCUS FAECALIS (A)  Final      Susceptibility   Enterococcus faecalis - MIC*    AMPICILLIN <=2 SENSITIVE Sensitive     NITROFURANTOIN <=16 SENSITIVE Sensitive     VANCOMYCIN 1 SENSITIVE Sensitive     * >=100,000 COLONIES/mL ENTEROCOCCUS FAECALIS  Blood Culture (routine x 2)     Status: Abnormal   Collection Time: 09/11/19 11:38 PM   Specimen: BLOOD  Result Value Ref Range Status   Specimen Description   Final    BLOOD RIGHT ANTECUBITAL Performed at [facility] [address] [place], [place] [address]    Special Requests   Final    BOTTLES DRAWN AEROBIC AND ANAEROBIC Blood Culture adequate volume Performed at [facility] [address] [place], [place] [address]    Culture  Setup Time   Final    IN BOTH AEROBIC AND ANAEROBIC BOTTLES GRAM POSITIVE COCCI IN CHAINS CRITICAL RESULT CALLED TO, READ BACK BY AND VERIFIED WITH: N GLOGOVAC PHARMD 09/12/19 1712 JDW    Culture (A)  Final    ENTEROCOCCUS FAECALIS SUSCEPTIBILITIES PERFORMED ON PREVIOUS CULTURE WITHIN THE LAST 5 DAYS. Performed at [facility], [place] [address], [place], [place] [address]    Report Status 09/15/2019 FINAL  Final  Urine culture     Status: Abnormal   Collection Time: 09/11/19 11:38 PM   Specimen: In/Out Cath Urine  Result Value Ref Range Status   Specimen Description   Final    IN/OUT CATH URINE Performed at [facility] [address], [place], [place] [address]    Special Requests   Final     NONE Performed at [name], [place] [address], [place],  [address]    Culture >=100,000 COLONIES/mL ENTEROCOCCUS FAECALIS (A)  Final   Report Status 09/13/2019 FINAL  Final   Organism ID, Bacteria ENTEROCOCCUS FAECALIS (A)  Final      Susceptibility   Enterococcus faecalis - MIC*    AMPICILLIN <=2 SENSITIVE Sensitive     NITROFURANTOIN <=16 SENSITIVE Sensitive     VANCOMYCIN 1 SENSITIVE Sensitive     * >=100,000 COLONIES/mL ENTEROCOCCUS FAECALIS  Blood Culture (  routine x 2)     Status: Abnormal   Collection Time: 09/11/19 11:43 PM   Specimen: BLOOD RIGHT FOREARM  Result Value Ref Range Status   Specimen Description   Final    BLOOD RIGHT FOREARM Performed at [facility], [address], [place], [place] [address]    Special Requests   Final    BOTTLES DRAWN AEROBIC ONLY Blood Culture results may not be optimal due to an inadequate volume of blood received in culture bottles Performed at [facility] [address], [place], [place] [address]    Culture  Setup Time   Final    AEROBIC BOTTLE ONLY GRAM POSITIVE COCCI IN CHAINS CRITICAL RESULT CALLED TO, READ BACK BY AND VERIFIED WITHGuadlupe [facility] 09/12/19 2100 Performed at [facility], [place] [address] [place],  [address]    Culture ENTEROCOCCUS FAECALIS (A)  Final   Report Status 09/15/2019 FINAL  Final   Organism ID, Bacteria ENTEROCOCCUS FAECALIS  Final      Susceptibility   Enterococcus faecalis - MIC*    AMPICILLIN <=2 SENSITIVE Sensitive     VANCOMYCIN 1 SENSITIVE Sensitive     GENTAMICIN SYNERGY SENSITIVE Sensitive     * ENTEROCOCCUS FAECALIS  Blood Culture ID Panel (Reflexed)     Status: Abnormal   Collection Time: 09/11/19 11:43 PM  Result Value Ref Range Status   Enterococcus species DETECTED (A) NOT DETECTED Final    Comment: CRITICAL RESULT CALLED TO, READ BACK BY AND VERIFIED WITH: N GLOGOVAC PHARMD 09/12/19 2100 JDW    Vancomycin resistance  NOT DETECTED NOT DETECTED Final   Listeria monocytogenes NOT DETECTED NOT DETECTED Final   Staphylococcus species NOT DETECTED NOT DETECTED Final   Staphylococcus aureus (BCID) NOT DETECTED NOT DETECTED Final   Streptococcus species NOT DETECTED NOT DETECTED Final   Streptococcus agalactiae NOT DETECTED NOT DETECTED Final   Streptococcus pneumoniae NOT DETECTED NOT DETECTED Final   Streptococcus pyogenes NOT DETECTED NOT DETECTED Final   Acinetobacter baumannii NOT DETECTED NOT DETECTED Final   Enterobacteriaceae species NOT DETECTED NOT DETECTED Final   Enterobacter cloacae complex NOT DETECTED NOT DETECTED Final   Escherichia coli NOT DETECTED NOT DETECTED Final   Klebsiella oxytoca NOT DETECTED NOT DETECTED Final   Klebsiella pneumoniae NOT DETECTED NOT DETECTED Final   Proteus species NOT DETECTED NOT DETECTED Final   Serratia marcescens NOT DETECTED NOT DETECTED Final   Haemophilus influenzae NOT DETECTED NOT DETECTED Final   Neisseria meningitidis NOT DETECTED NOT DETECTED Final   Pseudomonas aeruginosa NOT DETECTED NOT DETECTED Final   Candida albicans NOT DETECTED NOT DETECTED Final   Candida glabrata NOT DETECTED NOT DETECTED Final   Candida krusei NOT DETECTED NOT DETECTED Final   Candida parapsilosis NOT DETECTED NOT DETECTED Final   Candida tropicalis NOT DETECTED NOT DETECTED Final    Comment: Performed at [facility], [address] [place], [place] [address]  SARS CORONAVIRUS 2 (TAT 6-24 HRS) Nasopharyngeal Nasopharyngeal Swab     Status: None   Collection Time: 09/12/19  1:44 AM   Specimen: Nasopharyngeal Swab  Result Value Ref Range Status   SARS Coronavirus 2 NEGATIVE NEGATIVE Final    Comment: (NOTE) SARS-CoV-2 target nucleic acids are NOT DETECTED. The SARS-CoV-2 RNA is generally detectable in upper and lower respiratory specimens during the acute phase of infection. Negative results do not preclude SARS-CoV-2 infection, do not rule out co-infections  with other pathogens, and should not  be used as the sole basis for treatment or other patient management decisions. Negative results must be combined with clinical observations, patient history, and epidemiological information. The expected result is Negative. Fact Sheet for Patients: SugarRoll.be Fact Sheet for Healthcare Providers: https://www.woods-mathews.com/ This test is not yet approved or cleared by the [place] FDA and  has been authorized for detection and/or diagnosis of SARS-CoV-2 by FDA under an Emergency Use Authorization (EUA). This EUA will remain  in effect (meaning this test can be used) for the duration of the COVID-19 declaration under Section 56 4(b)(1) of the Act, 21 U.S.C. section 360bbb-3(b)(1), unless the authorization is terminated or revoked sooner. Performed at [facility], [place] [address], [place], [place] [address]   MRSA PCR Screening     Status: None   Collection Time: 09/12/19 10:03 PM   Specimen: Nasal Mucosa; Nasopharyngeal  Result Value Ref Range Status   MRSA by PCR NEGATIVE NEGATIVE Final    Comment:        The GeneXpert MRSA Assay (FDA approved for NASAL specimens only), is one component of a comprehensive MRSA colonization surveillance program. It is not intended to diagnose MRSA infection nor to guide or monitor treatment for MRSA infections. Performed at [facility], [place] [address] [place], [place] [address]   Culture, blood (routine x 2)     Status: None (Preliminary result)   Collection Time: 09/13/19 11:43 AM   Specimen: BLOOD  Result Value Ref Range Status   Specimen Description   Final    BLOOD RIGHT HAND Performed at [facility] [address], [place], [place] [address]    Special Requests   Final    BOTTLES DRAWN AEROBIC AND ANAEROBIC Blood Culture adequate volume Performed at [facility] [address]  [place], [place] [address]    Culture   Final    NO GROWTH 3 DAYS Performed at [facility], [place] [address] [place], [place] [address]    Report Status PENDING  Incomplete  Culture, blood (routine x 2)     Status: None (Preliminary result)   Collection Time: 09/13/19 11:55 AM   Specimen: BLOOD  Result Value Ref Range Status   Specimen Description   Final    BLOOD RIGHT ANTECUBITAL Performed at [facility] [address] [place], [place] [address]    Special Requests   Final    BOTTLES DRAWN AEROBIC AND ANAEROBIC Blood Culture adequate volume Performed at [facility] [address] [place], [place] [address]    Culture   Final    NO GROWTH 3 DAYS Performed at [facility], [place] [address], [place],  [address]    Report Status PENDING  Incomplete         Radiology Studies: No results found.      Scheduled Meds: . allopurinol  300 mg Oral Daily  . apixaban  5 mg Oral BID  . aspirin EC  81 mg Oral Daily  . chlorhexidine  15 mL Mouth Rinse BID  . Chlorhexidine Gluconate Cloth  6 each Topical Daily  . cyclobenzaprine  5 mg Oral QHS  . ezetimibe  10 mg Oral Daily  . finasteride  5 mg Oral Daily  . fluconazole  100 mg Oral Daily  . gabapentin  800 mg Oral QID  . insulin aspart  0-9 Units Subcutaneous TID WC  . isosorbide mononitrate  60 mg Oral Daily  .  mouth rinse  15 mL Mouth Rinse q12n4p  . metoprolol succinate  50 mg Oral Daily  . niacin  1,000 mg Oral BID  . pantoprazole  40 mg Oral Daily  . rosuvastatin  40 mg Oral Daily  . sacubitril-valsartan  1 tablet Oral BID   Continuous Infusions: . sodium chloride 20 mL/hr at 09/16/19 0200  . ampicillin (OMNIPEN) IV 2 g (09/16/19 0550)     LOS: 4 days   Time spent= 25 mins    Kahlen Morais Arsenio Loader, MD [facility]  If 7PM-7AM, please contact night-coverage  09/16/2019, 7:51 AM

## 2019-09-16 NOTE — Anesthesia Procedure Notes (Signed)
Procedure Name: MAC Date/Time: 09/16/2019 1:23 PM Performed by: Wilburn Cornelia, CRNA Pre-anesthesia Checklist: Patient identified, Emergency Drugs available, Suction available, Patient being monitored and Timeout performed Oxygen Delivery Method: Nasal cannula Placement Confirmation: positive ETCO2 and breath sounds checked- equal and bilateral Dental Injury: Teeth and Oropharynx as per pre-operative assessment

## 2019-09-16 NOTE — Transfer of Care (Signed)
Immediate Anesthesia Transfer of Care Note  Patient: Tim [name]  Procedure(s) Performed: TRANSESOPHAGEAL ECHOCARDIOGRAM (TEE) ([name] )  Patient Location: Endoscopy Unit  Anesthesia Type:MAC  Level of Consciousness: awake and alert   Airway & Oxygen Therapy: Patient Spontanous Breathing and Patient connected to nasal cannula oxygen  Post-op Assessment: Report given to RN and Post -op Vital signs reviewed and stable  Post vital signs: Reviewed and stable  Last Vitals:  Vitals Value Taken Time  BP 95/55 09/16/19 1411  Temp 36.9 C 09/16/19 1407  Pulse 65 09/16/19 1413  Resp 16 09/16/19 1413  SpO2 98 % 09/16/19 1413  Vitals shown include unvalidated device data.  Last Pain:  Vitals:   09/16/19 1407  TempSrc: Temporal  PainSc: Asleep         Complications: No apparent anesthesia complications

## 2019-09-16 NOTE — CV Procedure (Signed)
Brief TEE Notes  LVEF >55% Mild MR and TR TAVR present with mildly elevated gradients.  Mean gradient 14 mmHg, though the angle was sub-optimal for assessment.  Likely underestimated but appears stable from prior. Thickening of the anterior mitral valve leaflet unchanged from 2017.   Moderate mitral annular calcification noted. No LA/LAA thrombus Small PFO No evidence of endocarditis  For additional details see full report.  Tim [name] C. Oval Linsey, MD, [facility] 09/16/2019 2:06 PM

## 2019-09-16 NOTE — H&P (Signed)
Tim [name] is a 79 y.o. male who has presented today for surgery, with the diagnosis of bacteremia.  The various methods of treatment have been discussed with the patient and family. After consideration of risks, benefits and other options for treatment, the patient has consented to  Procedure(s): TRANSESOPHAGEAL ECHOCARDIOGRAM (TEE) ([name]) as a surgical intervention .  The patient's history has been reviewed, patient examined, no change in status, stable for surgery.  I have reviewed the patient's chart and labs.  Questions were answered to the patient's satisfaction.    [name] C. Oval Linsey, MD, [facility]  09/16/2019 12:48 PM

## 2019-09-16 NOTE — Anesthesia Preprocedure Evaluation (Signed)
Anesthesia Evaluation  Patient identified by MRN, date of birth, ID band Patient awake    Reviewed: Allergy & Precautions, NPO status , Patient's Chart, lab work & pertinent test results  History of Anesthesia Complications Negative for: history of anesthetic complications  Airway Mallampati: II  TM Distance: >3 FB Neck ROM: Full    Dental  (+) Teeth Intact   Pulmonary asthma , sleep apnea and Continuous Positive Airway Pressure Ventilation , COPD,    Pulmonary exam normal        Cardiovascular hypertension, + CAD, + Past MI, + Cardiac Stents and +CHF  Normal cardiovascular exam+ dysrhythmias Atrial Fibrillation + Cardiac Defibrillator + Valvular Problems/Murmurs (s/p TAVR 2017) AS      Neuro/Psych negative neurological ROS  negative psych ROS   GI/Hepatic Neg liver ROS, hiatal hernia, PUD, GERD  ,  Endo/Other  diabetes, Type 2Morbid obesity  Renal/GU negative Renal ROS  negative genitourinary   Musculoskeletal  (+) Arthritis ,   Abdominal (+) + obese,   Peds  Hematology  (+) anemia ,   Anesthesia Other Findings  bacteremia  Reproductive/Obstetrics                             Anesthesia Physical Anesthesia Plan  ASA: III  Anesthesia Plan: MAC   Post-op Pain Management:    Induction: Intravenous  PONV Risk Score and Plan: 1 and Propofol infusion, TIVA and Treatment may vary due to age or medical condition  Airway Management Planned: Natural Airway, Nasal Cannula and Simple Face Mask  Additional Equipment: None  Intra-op Plan:   Post-operative Plan:   Informed Consent: I have reviewed the patients History and Physical, chart, labs and discussed the procedure including the risks, benefits and alternatives for the proposed anesthesia with the patient or authorized representative who has indicated his/her understanding and acceptance.       Plan Discussed with:    Anesthesia Plan Comments:         Anesthesia Quick Evaluation

## 2019-09-16 NOTE — Progress Notes (Signed)
  Echocardiogram Echocardiogram Transesophageal has been performed.  Alson Mcpheeters A Jermya Dowding 09/16/2019, 2:22 PM

## 2019-09-16 NOTE — Anesthesia Postprocedure Evaluation (Signed)
Anesthesia Post Note  Patient: Tim [name]  Procedure(s) Performed: TRANSESOPHAGEAL ECHOCARDIOGRAM (TEE) ([name] )     Patient location during evaluation: Endoscopy Anesthesia Type: MAC Level of consciousness: awake and alert Pain management: pain level controlled Vital Signs Assessment: post-procedure vital signs reviewed and stable Respiratory status: spontaneous breathing, nonlabored ventilation and respiratory function stable Cardiovascular status: blood pressure returned to baseline and stable Postop Assessment: no apparent nausea or vomiting Anesthetic complications: no    Last Vitals:  Vitals:   09/16/19 1430 09/16/19 1506  BP: (!) 101/47 140/64  Pulse:  70  Resp:  18  Temp:  36.8 C  SpO2:  98%    Last Pain:  Vitals:   09/16/19 1506  TempSrc: Oral  PainSc:                  Lidia Collum

## 2019-09-17 ENCOUNTER — Inpatient Hospital Stay: Payer: Self-pay

## 2019-09-17 DIAGNOSIS — Z951 Presence of aortocoronary bypass graft: Secondary | ICD-10-CM

## 2019-09-17 DIAGNOSIS — R319 Hematuria, unspecified: Secondary | ICD-10-CM

## 2019-09-17 LAB — BASIC METABOLIC PANEL
Anion gap: 7 (ref 5–15)
BUN: 12 mg/dL (ref 8–23)
CO2: 27 mmol/L (ref 22–32)
Calcium: 8.4 mg/dL — ABNORMAL LOW (ref 8.9–10.3)
Chloride: 109 mmol/L (ref 98–111)
Creatinine, Ser: 0.72 mg/dL (ref 0.61–1.24)
GFR calc Af Amer: >60 mL/min (ref >60)
GFR calc non Af Amer: >60 mL/min (ref >60)
Glucose, Bld: 120 mg/dL — ABNORMAL HIGH (ref 70–99)
Potassium: 3.4 mmol/L — ABNORMAL LOW (ref 3.5–5.1)
Sodium: 143 mmol/L (ref 135–145)

## 2019-09-17 LAB — GLUCOSE, CAPILLARY
Glucose-Capillary: 114 mg/dL — ABNORMAL HIGH (ref 70–99)
Glucose-Capillary: 115 mg/dL — ABNORMAL HIGH (ref 70–99)
Glucose-Capillary: 113 mg/dL — ABNORMAL HIGH (ref 70–99)
Glucose-Capillary: 131 mg/dL — ABNORMAL HIGH (ref 70–99)

## 2019-09-17 LAB — CBC
HCT: 28.3 % — ABNORMAL LOW (ref 39.0–52.0)
Hemoglobin: 9.1 g/dL — ABNORMAL LOW (ref 13.0–17.0)
MCH: 29.9 pg (ref 26.0–34.0)
MCHC: 32.2 g/dL (ref 30.0–36.0)
MCV: 93.1 fL (ref 80.0–100.0)
Platelets: 132 10*3/uL — ABNORMAL LOW (ref 150–400)
RBC: 3.04 MIL/uL — ABNORMAL LOW (ref 4.22–5.81)
RDW: 14.2 % (ref 11.5–15.5)
WBC: 4.5 10*3/uL (ref 4.0–10.5)
nRBC: 0 % (ref 0.0–0.2)

## 2019-09-17 LAB — MAGNESIUM: Magnesium: 1.8 mg/dL (ref 1.7–2.4)

## 2019-09-17 MED ORDER — AMPICILLIN IV (FOR PTA / DISCHARGE USE ONLY): 12.0000 g | INTRAVENOUS | 0 refills | Status: AC

## 2019-09-17 MED ORDER — FLUCONAZOLE 100 MG PO TABS: 100.0000 mg | ORAL_TABLET | Freq: Every day | ORAL | 0 refills | Status: AC

## 2019-09-17 MED ORDER — SODIUM CHLORIDE 0.9% FLUSH: 10.0000 mL | INTRAVENOUS | Status: DC | PRN

## 2019-09-17 MED ORDER — SODIUM CHLORIDE 0.9 % IV SOLN
INTRAVENOUS | Status: DC | PRN
  Administered 2019-09-17: 250 mL via INTRAVENOUS

## 2019-09-17 NOTE — TOC Transition Note (Signed)
Transition of Care [facility]) - CM/SW Discharge Note   Patient Details  Name: Tim [name] MRN: [id number] Date of Birth: Dec 26, 1940  Transition of Care [facility]) CM/SW Contact:  Dessa Phi, RN Phone Number: 09/17/2019, 4:33 PM   Clinical Narrative: Spoke to patient/wpouse/[facility]am[facility] on phone about current d/c plan-all in agreement for d/c today-Pam w/iv abx initial instruction will come to hospital @ 5-5:30p for teaching-patient states he has used the pump before-therefore he & spouse is comforatble with no HHRN coming to home tomorrow they will visit on Friday, med to be ready by 8p for home administration. [facility] will make home visit 1x week for labs,picc flush,ongoing instruction if needed.  No further CM needs.    Final next level of care: [place] Barriers to Discharge: No Barriers Identified   Patient Goals and CMS Choice Patient states their goals for this hospitalization and ongoing recovery are:: go home CMS Medicare.gov Compare Post Acute Care list provided to:: Patient Choice offered to / list presented to : Patient  Discharge Placement                       Discharge Plan and Services   Discharge Planning Services: CM Consult                      HH Arranged: RN, IV Antibiotics HH Agency: [employer]([facility]-ongoing instruction iv abx.) Date HH Agency Contacted: 09/17/19 Time [place]: [id number] Representative spoke with at [place]: Pam-iv abx supplies,med  Social Determinants of Health ([place]) Interventions     Readmission Risk Interventions No flowsheet data found.

## 2019-09-17 NOTE — Progress Notes (Signed)
Subjective:  He is having some problems with hematuria today   Antibiotics:  Anti-infectives (From admission, onward)   Start     Dose/Rate Route Frequency Ordered Stop   09/13/19 1600  fluconazole (DIFLUCAN) tablet 100 mg     100 mg Oral Daily 09/13/19 1551 09/20/19 0959   09/13/19 1200  ampicillin (OMNIPEN) 2 g in sodium chloride 0.9 % 100 mL IVPB     2 g 300 mL/hr over 20 Minutes Intravenous Every 4 hours 09/13/19 1104     09/12/19 2200  linezolid (ZYVOX) IVPB 600 mg  Status:  Discontinued     600 mg 300 mL/hr over 60 Minutes Intravenous Every 12 hours 09/12/19 1214 09/13/19 1104   09/12/19 0600  piperacillin-tazobactam (ZOSYN) IVPB 3.375 g  Status:  Discontinued     3.375 g 12.5 mL/hr over 240 Minutes Intravenous Every 8 hours 09/11/19 2354 09/13/19 1104   09/12/19 0000  DAPTOmycin (CUBICIN) 750 mg in sodium chloride 0.9 % IVPB     750 mg 230 mL/hr over 30 Minutes Intravenous Once 09/11/19 2353 09/12/19 0131   09/12/19 0000  piperacillin-tazobactam (ZOSYN) IVPB 3.375 g     3.375 g 100 mL/hr over 30 Minutes Intravenous  Once 09/11/19 2354 09/12/19 0122      Medications: Scheduled Meds: . allopurinol  300 mg Oral Daily  . apixaban  5 mg Oral BID  . aspirin EC  81 mg Oral Daily  . chlorhexidine  15 mL Mouth Rinse BID  . Chlorhexidine Gluconate Cloth  6 each Topical Daily  . cyclobenzaprine  5 mg Oral QHS  . ezetimibe  10 mg Oral Daily  . finasteride  5 mg Oral Daily  . fluconazole  100 mg Oral Daily  . gabapentin  800 mg Oral QID  . insulin aspart  0-9 Units Subcutaneous TID WC  . isosorbide mononitrate  60 mg Oral Daily  . mouth rinse  15 mL Mouth Rinse q12n4p  . metoprolol succinate  50 mg Oral Daily  . niacin  1,000 mg Oral BID  . pantoprazole  40 mg Oral Daily  . rosuvastatin  40 mg Oral Daily  . sacubitril-valsartan  1 tablet Oral BID   Continuous Infusions: . sodium chloride 10 mL/hr at 09/17/19 0600  . ampicillin (OMNIPEN) IV 2 g (09/17/19  1006)   PRN Meds:.sodium chloride, acetaminophen **OR** acetaminophen, albuterol, fluticasone, nitroGLYCERIN, ondansetron **OR** ondansetron (ZOFRAN) IV, oxybutynin    Objective: Weight change: -1.7 kg  Intake/Output Summary (Last 24 hours) at 09/17/2019 1451 Last data filed at 09/17/2019 1312 Gross per 24 hour  Intake 534.93 ml  Output 1100 ml  Net -565.07 ml   Blood pressure 139/73, pulse 72, temperature 97.8 F (36.6 C), temperature source Oral, resp. rate 18, height 6' (1.829 m), weight (!) 153.4 kg, SpO2 98 %. Temp:  [97.6 F (36.4 C)-98.5 F (36.9 C)] 97.8 F (36.6 C) (03/17 0946) Pulse Rate:  [62-74] 72 (03/17 1311) Resp:  [18-22] 18 (03/17 1311) BP: (121-142)/(51-77) 139/73 (03/17 1311) SpO2:  [98 %-100 %] 98 % (03/17 1311) Weight:  [153.4 kg] 153.4 kg (03/17 0500)  Physical Exam: General: Alert and awake, oriented x3, not in any acute distress. HEENT: anicteric sclera, EOMI CVS regular rate, normal  Pacer site is clean Chest: , no wheezing, no respiratory distress Abdomen: soft non-distended,  Extremities:   Left amputation site 09/15/2019:      Right foot 09/15/2019:       Neuro: nonfocal  CBC:    BMET Recent Labs    09/16/19 0607 09/17/19 0456  NA 137 143  K 4.5 3.4*  CL 107 109  CO2 23 27  GLUCOSE 130* 120*  BUN 13 12  CREATININE 0.81 0.72  CALCIUM 8.2* 8.4*     Liver Panel  Recent Labs    09/15/19 0452 09/16/19 0607  PROT 5.5* 5.3*  ALBUMIN 2.6* 2.5*  AST 105* 90*  ALT 123* 99*  ALKPHOS 134* 111  BILITOT 0.8 0.5       Sedimentation Rate No results for input(s): ESRSEDRATE in the last 72 hours. C-Reactive Protein No results for input(s): CRP in the last 72 hours.  Micro Results: Recent Results (from the past 720 hour(s))  SARS CORONAVIRUS 2 (TAT 6-24 HRS) Nasopharyngeal Nasopharyngeal Swab     Status: None   Collection Time: 08/28/19  1:05 PM   Specimen: Nasopharyngeal Swab  Result Value Ref Range Status   SARS  Coronavirus 2 NEGATIVE NEGATIVE Final    Comment: (NOTE) SARS-CoV-2 target nucleic acids are NOT DETECTED. The SARS-CoV-2 RNA is generally detectable in upper and lower respiratory specimens during the acute phase of infection. Negative results do not preclude SARS-CoV-2 infection, do not rule out co-infections with other pathogens, and should not be used as the sole basis for treatment or other patient management decisions. Negative results must be combined with clinical observations, patient history, and epidemiological information. The expected result is Negative. Fact Sheet for Patients: SugarRoll.be Fact Sheet for Healthcare Providers: https://www.woods-mathews.com/ This test is not yet approved or cleared by the [place] FDA and  has been authorized for detection and/or diagnosis of SARS-CoV-2 by FDA under an Emergency Use Authorization (EUA). This EUA will remain  in effect (meaning this test can be used) for the duration of the COVID-19 declaration under Section 56 4(b)(1) of the Act, 21 U.S.C. section 360bbb-3(b)(1), unless the authorization is terminated or revoked sooner. Performed at [facility], [place] [address], [place], [place] [address]   Urine culture     Status: Abnormal   Collection Time: 09/09/19  5:32 PM   Specimen: Urine, Clean Catch  Result Value Ref Range Status   Specimen Description URINE, CLEAN CATCH  Final   Special Requests   Final    NONE Performed at [facility], [place] [address] [place], [place] [address]    Culture (A)  Final    >=100,000 COLONIES/mL ENTEROCOCCUS FAECALIS >=100,000 COLONIES/mL AEROCOCCUS URINAE    Report Status 09/12/2019 FINAL  Final   Organism ID, Bacteria ENTEROCOCCUS FAECALIS (A)  Final      Susceptibility   Enterococcus faecalis - MIC*    AMPICILLIN <=2 SENSITIVE Sensitive     NITROFURANTOIN <=16 SENSITIVE Sensitive     VANCOMYCIN 1 SENSITIVE Sensitive     *  >=100,000 COLONIES/mL ENTEROCOCCUS FAECALIS  Blood Culture (routine x 2)     Status: Abnormal   Collection Time: 09/11/19 11:38 PM   Specimen: BLOOD  Result Value Ref Range Status   Specimen Description   Final    BLOOD RIGHT ANTECUBITAL Performed at [facility] [address] [place], [place] [address]    Special Requests   Final    BOTTLES DRAWN AEROBIC AND ANAEROBIC Blood Culture adequate volume Performed at [facility] [address] [place], [place] [address]    Culture  Setup Time   Final    IN BOTH AEROBIC AND ANAEROBIC BOTTLES GRAM POSITIVE COCCI IN  CHAINS CRITICAL RESULT CALLED TO, READ BACK BY AND VERIFIED WITH: N GLOGOVAC PHARMD 09/12/19 1712 JDW    Culture (A)  Final    ENTEROCOCCUS FAECALIS SUSCEPTIBILITIES PERFORMED ON PREVIOUS CULTURE WITHIN THE LAST 5 DAYS. Performed at [facility], [place] [address], [place], [place] [address]    Report Status 09/15/2019 FINAL  Final  Urine culture     Status: Abnormal   Collection Time: 09/11/19 11:38 PM   Specimen: In/Out Cath Urine  Result Value Ref Range Status   Specimen Description   Final    IN/OUT CATH URINE Performed at [facility] [address] [place], [place] [address]    Special Requests   Final    NONE Performed at [facility], [place] [address], [place], [place] [address]    Culture >=100,000 COLONIES/mL ENTEROCOCCUS FAECALIS (A)  Final   Report Status 09/13/2019 FINAL  Final   Organism ID, Bacteria ENTEROCOCCUS FAECALIS (A)  Final      Susceptibility   Enterococcus faecalis - MIC*    AMPICILLIN <=2 SENSITIVE Sensitive     NITROFURANTOIN <=16 SENSITIVE Sensitive     VANCOMYCIN 1 SENSITIVE Sensitive     * >=100,000 COLONIES/mL ENTEROCOCCUS FAECALIS  Blood Culture (routine x 2)     Status: Abnormal   Collection Time: 09/11/19 11:43 PM   Specimen: BLOOD RIGHT FOREARM  Result Value Ref Range Status   Specimen Description    Final    BLOOD RIGHT FOREARM Performed at [facility], [place] [address] [place], [place] [address]    Special Requests   Final    BOTTLES DRAWN AEROBIC ONLY Blood Culture results may not be optimal due to an inadequate volume of blood received in culture bottles Performed at [facility] [address], [place], [place] [address]    Culture  Setup Time   Final    AEROBIC BOTTLE ONLY GRAM POSITIVE COCCI IN CHAINS CRITICAL RESULT CALLED TO, READ BACK BY AND VERIFIED WITHGuadlupe [facility] 09/12/19 2100 Performed at [facility], [place] [address], [place], [place] [address]    Culture ENTEROCOCCUS FAECALIS (A)  Final   Report Status 09/15/2019 FINAL  Final   Organism ID, Bacteria ENTEROCOCCUS FAECALIS  Final      Susceptibility   Enterococcus faecalis - MIC*    AMPICILLIN <=2 SENSITIVE Sensitive     VANCOMYCIN 1 SENSITIVE Sensitive     GENTAMICIN SYNERGY SENSITIVE Sensitive     * ENTEROCOCCUS FAECALIS  Blood Culture ID Panel (Reflexed)     Status: Abnormal   Collection Time: 09/11/19 11:43 PM  Result Value Ref Range Status   Enterococcus species DETECTED (A) NOT DETECTED Final    Comment: CRITICAL RESULT CALLED TO, READ BACK BY AND VERIFIED WITH: N GLOGOVAC PHARMD 09/12/19 2100 JDW    Vancomycin resistance NOT DETECTED NOT DETECTED Final   Listeria monocytogenes NOT DETECTED NOT DETECTED Final   Staphylococcus species NOT DETECTED NOT DETECTED Final   Staphylococcus aureus (BCID) NOT DETECTED NOT DETECTED Final   Streptococcus species NOT DETECTED NOT DETECTED Final   Streptococcus agalactiae NOT DETECTED NOT DETECTED Final   Streptococcus pneumoniae NOT DETECTED NOT DETECTED Final   Streptococcus pyogenes NOT DETECTED NOT DETECTED Final   Acinetobacter baumannii NOT DETECTED NOT DETECTED Final   Enterobacteriaceae species NOT DETECTED NOT DETECTED Final   Enterobacter cloacae complex NOT DETECTED NOT DETECTED Final   Escherichia coli NOT DETECTED NOT  DETECTED Final  Klebsiella oxytoca NOT DETECTED NOT DETECTED Final   Klebsiella pneumoniae NOT DETECTED NOT DETECTED Final   Proteus species NOT DETECTED NOT DETECTED Final   Serratia marcescens NOT DETECTED NOT DETECTED Final   Haemophilus influenzae NOT DETECTED NOT DETECTED Final   Neisseria meningitidis NOT DETECTED NOT DETECTED Final   Pseudomonas aeruginosa NOT DETECTED NOT DETECTED Final   Candida albicans NOT DETECTED NOT DETECTED Final   Candida glabrata NOT DETECTED NOT DETECTED Final   Candida krusei NOT DETECTED NOT DETECTED Final   Candida parapsilosis NOT DETECTED NOT DETECTED Final   Candida tropicalis NOT DETECTED NOT DETECTED Final    Comment: Performed at [facility], [place] [address], [place], [place] [address]  SARS CORONAVIRUS 2 (TAT 6-24 HRS) Nasopharyngeal Nasopharyngeal Swab     Status: None   Collection Time: 09/12/19  1:44 AM   Specimen: Nasopharyngeal Swab  Result Value Ref Range Status   SARS Coronavirus 2 NEGATIVE NEGATIVE Final    Comment: (NOTE) SARS-CoV-2 target nucleic acids are NOT DETECTED. The SARS-CoV-2 RNA is generally detectable in upper and lower respiratory specimens during the acute phase of infection. Negative results do not preclude SARS-CoV-2 infection, do not rule out co-infections with other pathogens, and should not be used as the sole basis for treatment or other patient management decisions. Negative results must be combined with clinical observations, patient history, and epidemiological information. The expected result is Negative. Fact Sheet for Patients: SugarRoll.be Fact Sheet for Healthcare Providers: https://www.woods-mathews.com/ This test is not yet approved or cleared by the [place] FDA and  has been authorized for detection and/or diagnosis of SARS-CoV-2 by FDA under an Emergency Use Authorization (EUA). This EUA will remain  in effect (meaning this test can be used)  for the duration of the COVID-19 declaration under Section 56 4(b)(1) of the Act, 21 U.S.C. section 360bbb-3(b)(1), unless the authorization is terminated or revoked sooner. Performed at [facility], [place] [address] [place], [place] [address]   MRSA PCR Screening     Status: None   Collection Time: 09/12/19 10:03 PM   Specimen: Nasal Mucosa; Nasopharyngeal  Result Value Ref Range Status   MRSA by PCR NEGATIVE NEGATIVE Final    Comment:        The GeneXpert MRSA Assay (FDA approved for NASAL specimens only), is one component of a comprehensive MRSA colonization surveillance program. It is not intended to diagnose MRSA infection nor to guide or monitor treatment for MRSA infections. Performed at [name], [place] [address], [place], [place] [address]   Culture, blood (routine x 2)     Status: None (Preliminary result)   Collection Time: 09/13/19 11:43 AM   Specimen: BLOOD  Result Value Ref Range Status   Specimen Description   Final    BLOOD RIGHT HAND Performed at [facility] [address] [place], [place] [address]    Special Requests   Final    BOTTLES DRAWN AEROBIC AND ANAEROBIC Blood Culture adequate volume Performed at [facility] [address] [place], [place] [address]    Culture   Final    NO GROWTH 4 DAYS Performed at [facility], [place] [address], [place], [place] [address]    Report Status PENDING  Incomplete  Culture, blood (routine x 2)     Status: None (Preliminary result)   Collection Time: 09/13/19 11:55 AM   Specimen: BLOOD  Result Value Ref Range Status   Specimen  Description   Final    BLOOD RIGHT ANTECUBITAL Performed at [facility] [address], [place], [place] [address]    Special Requests   Final    BOTTLES DRAWN AEROBIC AND ANAEROBIC Blood Culture adequate volume Performed at [place] [address] [place],  [place] [address]    Culture   Final    NO GROWTH 4 DAYS Performed at [facility], [place] [address], [place], [place] [address]    Report Status PENDING  Incomplete    Studies/Results: ECHO TEE  Result Date: 09/16/2019    TRANSESOPHOGEAL ECHO REPORT   Patient Name:   Tim [name] Date of Exam: 09/16/2019 Medical Rec #:  [id number]       Height:       72.0 in Accession #:    [id number]      Weight:       340.4 lb Date of Birth:  18-Dec-1940       BSA:          2.671 m Patient Age:    79 years        BP:           113/39 mmHg Patient Gender: M               HR:           64 bpm. Exam Location:  Inpatient Procedure: Transesophageal Echo, Cardiac Doppler and Color Doppler Indications:     Bacteremia  History:         Patient has prior history of Echocardiogram examinations, most                  recent 09/13/2019. CAD, AICD (automatic                  cardioverter/defibrillator) present, COPD, Moderate aortic                  stenosis, Arrythmias:Atrial Fibrillation; Risk                  Factors:Diabetes, Sleep Apnea and Hypertension. S/P TAVR                  (transcatheter aortic valve replacement).  Sonographer:     Vikki Ports Turrentine Referring Phys:  [id number] [place] Diagnosing Phys: Skeet Latch MD PROCEDURE: The transesophogeal probe was passed without difficulty through the esophogus of the patient. Sedation performed by different physician. The patient was monitored while under deep sedation. Anesthestetic sedation was provided intravenously by Anesthesiology: 436m of Propofol. The patient's vital signs; including heart rate, blood pressure, and oxygen saturation; remained stable throughout the procedure. The patient developed no complications during the procedure. IMPRESSIONS  1. Left ventricular ejection fraction, by estimation, is 60 to 65%. The left ventricle has normal function. The left ventricle has no regional wall motion abnormalities.  2. Right ventricular systolic function is  normal. The right ventricular size is normal.  3. No left atrial/left atrial appendage thrombus was detected.  4. The mitral valve is normal in structure. Mild mitral valve regurgitation. No evidence of mitral stenosis.  5. TAVR gradient mildly elevated. Mean gradient 14 mmHg. This is likely underestimated due to the angle of detection. TTE data is more accurate. The aortic valve has been repaired/replaced. Aortic valve regurgitation is not visualized. Aortic valve mean  gradient measures 14.0 mmHg. Aortic valve Vmax measures 2.49 m/s.  6. The inferior vena cava is normal in  size with greater than 50% respiratory variability, suggesting right atrial pressure of 3 mmHg.  7. Evidence of atrial level shunting detected by color flow Doppler. There is a small patent foramen ovale with predominantly left to right shunting across the atrial septum. Conclusion(s)/Recommendation(s): No evidence of vegetation/infective endocarditis on this transesophageal echocardiogram. Findings are concerning for an interatrial shunt as detailed above. FINDINGS  Left Ventricle: Left ventricular ejection fraction, by estimation, is 60 to 65%. The left ventricle has normal function. The left ventricle has no regional wall motion abnormalities. The left ventricular internal cavity size was normal in size. There is  no left ventricular hypertrophy. Right Ventricle: The right ventricular size is normal. No increase in right ventricular wall thickness. Right ventricular systolic function is normal. Left Atrium: Left atrial size was normal in size. No left atrial/left atrial appendage thrombus was detected. Right Atrium: Right atrial size was normal in size. Pericardium: There is no evidence of pericardial effusion. Mitral Valve: Mobile density noted on the anterior mitral valve. This is unchanged from echo 01/2016. The mitral valve is normal in structure. There is mild thickening of the mitral valve leaflet(s). There is mild calcification of the  mitral valve leaflet(s). Normal mobility of the mitral valve leaflets. Mild mitral annular calcification. Mild mitral valve regurgitation. No evidence of mitral valve stenosis. Tricuspid Valve: The tricuspid valve is normal in structure. Tricuspid valve regurgitation is not demonstrated. No evidence of tricuspid stenosis. Aortic Valve: TAVR gradient mildly elevated. Mean gradient 14 mmHg. This is likely underestimated due to the angle of detection. TTE data is more accurate. The aortic valve has been repaired/replaced. Aortic valve regurgitation is not visualized. Aortic valve mean gradient measures 14.0 mmHg. Aortic valve peak gradient measures 24.8 mmHg. 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 Pulmonic Valve: The pulmonic valve was normal in structure. Pulmonic valve regurgitation is not visualized. No evidence of pulmonic stenosis. Aorta: The aortic root is normal in size and structure. Venous: The inferior vena cava is normal in size with greater than 50% respiratory variability, suggesting right atrial pressure of 3 mmHg. IAS/Shunts: Evidence of atrial level shunting detected by color flow Doppler. A small patent foramen ovale is detected with predominantly left to right shunting across the atrial septum.  AORTIC VALVE AV Vmax:      249.00 cm/s AV Vmean:     169.000 cm/s AV VTI:       0.411 m AV Peak Grad: 24.8 mmHg AV Mean Grad: 14.0 mmHg Skeet Latch MD Electronically signed by Skeet Latch MD Signature Date/Time: 09/16/2019/5:07:05 PM    Final    [name] EKG SITE RITE  Result Date: 09/17/2019 If Site Rite image not attached, placement could not be confirmed due to current cardiac rhythm.     Assessment/Plan:  INTERVAL HISTORY: TEE negative   Principal Problem:   Enterococcal bacteremia Active Problems:   Obesity   OBSTRUCTIVE SLEEP APNEA   Essential hypertension, benign   Venous insufficiency of both lower extremities   COPD  (chronic obstructive pulmonary disease) (HCC)   CAD (coronary artery disease), native coronary artery   Paroxysmal atrial fibrillation (HCC)   Moderate aortic stenosis   Diabetic neuropathy, painful (HCC)   Sepsis ([place])   Enterococcus UTI   History of transmetatarsal amputation of left foot (HCC)   Pressure injury of skin   AICD (automatic cardioverter/defibrillator) present   S/P TAVR (transcatheter aortic valve replacement)   Urinary retention   Phimosis  ICD (implantable cardioverter-defibrillator) battery depletion    Tim [name] is a 79 y.o. male with  AMP sensitive enterococcal bacteremia from complicated urinary tract infection who also has an AICD and a TAVR.  Transthoracic echocardiogram is unrevealing but he does need a transesophageal echocardiogram given his enterococcal bacteremia and the presence of his device and artificial valve  He also recently underwent transmetatarsal amputation by Dr. Sharol Given for severe infection in his left foot.  #1 ampicillin sensitive enterococcal bacteremia.  Transesophageal echocardiogram is clean.  We will have him complete 2 weeks of ampicillin but convert to a continuous infusion fusion when he goes home.  I will then check surveillance cultures 2 or more weeks after he has completed antimicrobial therapy  2.  Transmetatarsal amputation site: Is imperative that this be kept clean and that he manage infections prudently particularly to protect his AICD  Diagnosis: Ampicillin sensitive enterococcal bacteremia  Culture Result: Enterococcus faecalis  Allergies  Allergen Reactions  . Brimonidine Tartrate Shortness Of Breath    Alphagan-Shortness of breath  . Brimonidine Tartrate Palpitations  . Brinzolamide Shortness Of Breath    AZOPT- Shortness of breath  . Latanoprost Shortness Of Breath    XALATAN- Shortness of breath  . Nucynta [Tapentadol] Shortness Of Breath  . Sulfa Antibiotics Palpitations  . Timolol Maleate Shortness Of  Breath and Other (See Comments)    TIMOPTIC- Aggravated asthma  . Cardizem  [Diltiazem Hcl] Swelling  . Diltiazem Swelling     leg swelling  . Rofecoxib Swelling     VIOXX- leg swelling  . Vancomycin Hives and Other (See Comments)    Possible "Red Man Syndrome"? > hives/blisters  . Codeine Other (See Comments)    Childhood reaction  . Celecoxib Other (See Comments)    CELLBREX-confusion  . Colchicine Diarrhea    diarrhea  . Tape Rash    OPAT Orders Discharge antibiotics:  Ampicillin via continuous infusion  Duration: 2 weeks End Date:  September 26, 2019  [facility] Care Per Protocol:    Labs  weekly while on IV antibiotics: _x_ CBC with differential _x_ BMP w GFR/CMP   _x_ Please pull PIC at completion of IV antibiotics __ Please leave PIC in place until doctor has seen patient or been notified  Fax weekly labs to [phone]   Haynes Hoehn has an appointment on April 21st, 2021 at 2:45 PM with Dr. Drucilla Schmidt  The [facility] is located in the [facility] at  [place] in [place].  [address], which is located to the left of the elevators.  Phone: [phone]  Fax: [phone]  [email or web address]  He should arrive 15 minutes prior to his appointment.  We will sign off for now please call with further questions.      LOS: 5 days   Alcide Evener 09/17/2019, 2:51 PM

## 2019-09-17 NOTE — Discharge Summary (Signed)
Physician Discharge Summary  Tim [name] MLY:[id number] DOB: 01/21/1941 DOA: 09/11/2019  PCP: Tammi Sou, MD  Admit date: 09/11/2019 Discharge date: 09/17/2019  Admitted From: Home Disposition: Home  Recommendations for Outpatient Follow-up:  1. Follow up with PCP in 1 week 2. Follow up with infectious disease for bacteremia 3. Follow up with urology for balantitis and foley catheter 4. Follow up with outpatient cardiology for abnormal Transesophageal Echocardiogram  5. Lab work per antibiotic protocol with home health 6. Please follow up on the following pending results: Final result for blood culture  Home Health: RN (patient declines PT) Equipment/Devices: Antibiotics  Discharge Condition: Stable CODE STATUS: Full code Diet recommendation: Heart healthy   Brief/Interim Summary:  Admission HPI written by Rise Patience, MD   Chief Complaint: Fever and medication issues.  HPI: Tim [name] is a 79 y.o. male with history of CAD status post stenting, cardiomyopathy, status post TAVR, diabetes mellitus had a left transmetatarsal amputation by Dr. Sharol Given on August 29, 2019 for osteomyelitis and also had come to the ER on September 09, 2019 with concerns of decreased urination and pain for which patient was seen by Dr. Tresa Moore urologist and was found to have balanitis and had urinary catheter placed and plan was to have further procedure done next week for which patient's apixaban is on hold was prescribed antibiotic and patient was taking fluconazole for last 2 days.  Per patient's wife patient started having fever and chills yesterday with temperature of 104 F and patient was confused.  Given the symptoms patient presents to the ER.  Denies any nausea vomiting diarrhea chest pain or shortness of breath.  ED Course: In the ER patient blood pressures in the low normal and lactic acid was normal.  Procalcitonin was 0.21 WBC 13.9 hemoglobin 10.8.  X-ray of the left foot  does not show any osteomyelitis chest x-ray unremarkable.  UA is consistent with UTI and recent urine culture showed Enterococcus.  Patient was started on daptomycin since patient was allergic to vancomycin and also on Zosyn after blood cultures obtained.  On my exam patient is alert awake oriented to time place and person.  Stated blood pressure usually runs low.   Hospital course:  Sepsis secondary to enterococcal bacteremia CAUTI Urine and blood cultures significant for enterococcus faecalis. Patient initially empirically treated with Zosyn/daptomycin/linezolid and was transitioned to Ampicillin. Infectious disease was consulted. Patient underwent Transthoracic Echocardiogram and Transesophageal Echocardiogram with no evidence of vegetations noted.   Fungal balanitis/phimosis with acute urinary retention Patient also seen by urology for fungal balanitis and started on fluconazole. Foley placed and patient will need to follow-up with urology outpatient.  Hematuria Secondary to above. Anticoagulation was held intermittently. Hematuria stopped prior to discharge.  Mild transaminitis Possibly secondary to sepsis. Improving prior to discharge.  Bilateral mild hearing deficit Recommend outpatient follow-up. Possibly ENT referral for hearing/audiology testing  Status post left TMA for osteomyelitis 08/29/2019 Stump shrinker in place.  History of CAD status post PCI Continue aspirin.  Currently chest pain-free.  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  Follows with cardiology at [facility]. Continue isosorbide mononitrate, Entresto and beta-blocker.  Paroxysmal atrial fibrillation Eliquis on hold anticipation for procedure  DM 2 with peripheral neuropathy On gabapentin.  Insulin sliding scale Accu-Chek  History of OSA on CPAP  Hyperlipidemia Continue Crestor.  Abnormal Transesophageal Echocardiogram finding Transesophageal  Echocardiogram suggests interatrial shunt. Outpatient cardiology follow-up  Discharge Diagnoses:  Principal Problem:   Enterococcal bacteremia Active Problems:   Obesity   OBSTRUCTIVE SLEEP APNEA   Essential hypertension, benign   Venous insufficiency of both lower extremities   COPD (chronic obstructive pulmonary disease) (HCC)   CAD (coronary artery disease), native coronary artery   Paroxysmal atrial fibrillation (HCC)   Moderate aortic stenosis   Diabetic neuropathy, painful (HCC)   Sepsis ([place])   Enterococcus UTI   History of transmetatarsal amputation of left foot (HCC)   Pressure injury of skin   AICD (automatic cardioverter/defibrillator) present   S/P TAVR (transcatheter aortic valve replacement)   Urinary retention   Phimosis   ICD (implantable cardioverter-defibrillator) battery depletion    Discharge Instructions  Discharge Instructions    Call MD for:  temperature >100.4   Complete by: As directed    Home infusion instructions   Complete by: As directed    Instructions: Flushing of vascular access device: 0.9% NaCl pre/post medication administration and prn patency; Heparin 100 u/ml, 27m for implanted ports and Heparin 10u/ml, 589mfor all other central venous catheters.   Increase activity slowly   Complete by: As directed      Allergies as of 09/17/2019      Reactions   Brimonidine Tartrate Shortness Of Breath   Alphagan-Shortness of breath   Brimonidine Tartrate Palpitations   Brinzolamide Shortness Of Breath   AZOPT- Shortness of breath   Latanoprost Shortness Of Breath   XALATAN- Shortness of breath   Nucynta [tapentadol] Shortness Of Breath   Sulfa Antibiotics Palpitations   Timolol Maleate Shortness Of Breath, Other (See Comments)   TIMOPTIC- Aggravated asthma   Cardizem  [diltiazem Hcl] Swelling   Diltiazem Swelling    leg swelling   Rofecoxib Swelling    VIOXX- leg swelling   Vancomycin Hives, Other (See Comments)   Possible "Red Man  Syndrome"? > hives/blisters   Codeine Other (See Comments)   Childhood reaction   Celecoxib Other (See Comments)   CELLBREX-confusion   Colchicine Diarrhea   diarrhea   Tape Rash      Medication List    STOP taking these medications   Klor-Con M20 20 MEQ tablet Generic drug: potassium chloride SA     TAKE these medications   albuterol 108 (90 Base) MCG/ACT inhaler Commonly known as: VENTOLIN HFA Inhale 2 puffs into the lungs 4 (four) times daily as needed for wheezing or shortness of breath.   allopurinol 300 MG tablet Commonly known as: ZYLOPRIM TAKE 1 TABLET BY MOUTH ONCE DAILY WITH FOOD What changed: See the new instructions.   ampicillin  IVPB Inject 12 g into the vein continuous for 9 days. Infuse 12gm daily over 24h as continuous infusion Indication: E. Faecalis bacteremia with ICD Last Day of Therapy: 09/26/2019 Labs - Once weekly:  CBC/D and BMP   apixaban 5 MG Tabs tablet Commonly known as: Eliquis Take 1 tablet (5 mg total) by mouth 2 (two) times daily.   Aspirin Low Dose 81 MG EC tablet Generic drug: aspirin Take 81 mg by mouth daily.   B-complex with vitamin C tablet Take 1 tablet by mouth daily. Take 1 tablet daily, 25074m cyclobenzaprine 5 MG tablet Commonly known as: FLEXERIL Take 0.5 tablet to 1 tablet at bedtime  for muscle spasm. What changed:   how much to take  how to take this  when to take this   ezetimibe 10 MG tablet Commonly known as: ZETIA take 1 tablet by mouth once daily   finasteride  5 MG tablet Commonly known as: PROSCAR TAKE 1 TABLET BY MOUTH EVERY DAY   fluconazole 100 MG tablet Commonly known as: DIFLUCAN Take 1 tablet (100 mg total) by mouth daily for 2 days. Start taking on: September 18, 2019 What changed:   medication strength  how much to take   fluticasone 50 MCG/ACT nasal spray Commonly known as: FLONASE Place 2 sprays into both nostrils as needed for allergies or rhinitis.   gabapentin 800 MG  tablet Commonly known as: NEURONTIN TAKE 1 TABLET (800 MG TOTAL) BY MOUTH 4 (FOUR) TIMES DAILY.   isosorbide mononitrate 60 MG 24 hr tablet Commonly known as: IMDUR TAKE 1 TABLET BY MOUTH EVERY DAY   metoprolol succinate 50 MG 24 hr tablet Commonly known as: TOPROL-XL Take 50 mg by mouth daily. Take along with the 62m tablet to make total dose of 751mWhat changed: Another medication with the same name was removed. Continue taking this medication, and follow the directions you see here.   multivitamin capsule Take 1 capsule by mouth daily.   niacin 1000 MG CR tablet Commonly known as: NIASPAN TAKE 1 TABLET BY MOUTH TWICE A DAY   nitroGLYCERIN 0.4 MG SL tablet Commonly known as: NITROSTAT Place 0.4 mg under the tongue every 5 (five) minutes as needed for chest pain.   nystatin powder Commonly known as: MYCOSTATIN/NYSTOP Apply 1 application topically 3 (three) times daily.   omeprazole 10 MG capsule Commonly known as: PRILOSEC TAKE 1 CAPSULE BY MOUTH EVERY DAY What changed: how much to take   rosuvastatin 40 MG tablet Commonly known as: CRESTOR take 1 tablet by mouth once daily   sacubitril-valsartan 24-26 MG Commonly known as: ENTRESTO Take 1 tablet by mouth 2 (two) times daily.   VITAMIN D (CHOLECALCIFEROL) PO Take 1 tablet by mouth daily.            Home Infusion Instuctions  (From admission, onward)         Start     Ordered   09/17/19 0000  Home infusion instructions    Question:  Instructions  Answer:  Flushing of vascular access device: 0.9% NaCl pre/post medication administration and prn patency; Heparin 100 u/ml, 47m31mor implanted ports and Heparin 10u/ml, 47ml97mr all other central venous catheters.   09/17/19 1558         Follow-up Information    [facility] Follow up.   Why: [id number] for [employer] ongoing instruction.         Allergies  Allergen Reactions  . Brimonidine Tartrate Shortness Of Breath     Alphagan-Shortness of breath  . Brimonidine Tartrate Palpitations  . Brinzolamide Shortness Of Breath    AZOPT- Shortness of breath  . Latanoprost Shortness Of Breath    XALATAN- Shortness of breath  . Nucynta [Tapentadol] Shortness Of Breath  . Sulfa Antibiotics Palpitations  . Timolol Maleate Shortness Of Breath and Other (See Comments)    TIMOPTIC- Aggravated asthma  . Cardizem  [Diltiazem Hcl] Swelling  . Diltiazem Swelling     leg swelling  . Rofecoxib Swelling     VIOXX- leg swelling  . Vancomycin Hives and Other (See Comments)    Possible "Red Man Syndrome"? > hives/blisters  . Codeine Other (See Comments)    Childhood reaction  . Celecoxib Other (See Comments)    CELLBREX-confusion  . Colchicine Diarrhea    diarrhea  . Tape Rash    Consultations:  Infectious disease  Urology  Cardiology   Procedures/Studies: CT Abdomen Pelvis W Contrast  Result Date: 09/09/2019 CLINICAL DATA:  Dysuria, acute generalized abdominal pain. EXAM: CT ABDOMEN AND PELVIS WITH CONTRAST TECHNIQUE: Multidetector CT imaging of the abdomen and pelvis was performed using the standard protocol following bolus administration of intravenous contrast. CONTRAST:  153m OMNIPAQUE IOHEXOL 300 MG/ML  SOLN COMPARISON:  None. FINDINGS: Lower chest: No acute abnormality. Hepatobiliary: No focal liver abnormality is seen. Status post cholecystectomy. No biliary dilatation. Pancreas: Unremarkable. No pancreatic ductal dilatation or surrounding inflammatory changes. Spleen: Normal in size without focal abnormality. Adrenals/Urinary Tract: Adrenal glands are unremarkable. Kidneys are normal, without renal calculi, focal lesion, or hydronephrosis. Bladder is unremarkable. Stomach/Bowel: Stomach is within normal limits. Appendix appears normal. No evidence of bowel wall thickening, distention, or inflammatory changes. Sigmoid diverticulosis is noted without inflammation. Vascular/Lymphatic: Aortic atherosclerosis.  No enlarged abdominal or pelvic lymph nodes. Reproductive: Prostate is unremarkable. Other: No abdominal wall hernia or abnormality. No abdominopelvic ascites. Musculoskeletal: No acute or significant osseous findings. IMPRESSION: 1. Sigmoid diverticulosis without inflammation. 2. Aortic atherosclerosis. 3. No acute abnormality seen in the abdomen or pelvis. Aortic Atherosclerosis (ICD10-I70.0). Electronically Signed   By: JMarijo ConceptionM.D.   On: 09/09/2019 16:13   [place]  Result Date: 09/12/2019 CLINICAL DATA:  79year old male with gross hematuria. EXAM: RENAL / URINARY TRACT ULTRASOUND COMPLETE COMPARISON:  09/09/2019 CT FINDINGS: Right Kidney: Renal measurements: 10.6 x 5.6 x 5.7 cm = volume: 177 mL . Echogenicity within normal limits. No mass or hydronephrosis visualized. Left Kidney: Renal measurements: 13.8 x 5.1 x 5.5 cm = volume: 202 mL. Echogenicity within normal limits. No mass or hydronephrosis visualized. Bladder: No gross bladder abnormalities are noted. Bladder volume is 566 cc. A Foley catheter tip is not identified within the bladder. Other: None. IMPRESSION: 1. Unremarkable kidneys. 2. Foley catheter tip is not identified within the bladder and tip may lie within the urethra. Correlate clinically. No gross bladder abnormality. Electronically Signed   By: JMargarette CanadaM.D.   On: 09/12/2019 13:41   DG Chest Port 1 View  Result Date: 09/12/2019 CLINICAL DATA:  Fever. EXAM: PORTABLE CHEST 1 VIEW COMPARISON:  December 23, 2012 FINDINGS: The heart size is enlarged. 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 There is no pneumothorax. There is prominence of the perihilar regions bilaterally which is nonspecific. There is no focal infiltrate. IMPRESSION: 1. No definite acute cardiopulmonary process. 2. Prominent hila bilaterally. This is nonspecific but may be secondary to pulmonary artery hypertension. An outpatient two-view chest x-ray is  recommended for further evaluation. Electronically Signed   By: CConstance HolsterM.D.   On: 09/12/2019 00:26   DG Foot Complete Left  Result Date: 09/12/2019 CLINICAL DATA:  Infection. EXAM: LEFT FOOT - COMPLETE 3+ VIEW COMPARISON:  08/26/2019 FINDINGS: The patient is status post prior transmetatarsal amputation of the left foot. There is some surrounding soft tissue swelling. There is no radiographic evidence for osteomyelitis. There are no pockets of subcutaneous gas. There is a stable foreign body projecting within the plantar soft tissues at the level of the midfoot. There is a moderate-sized plantar calcaneal spur. IMPRESSION: 1. Postsurgical changes as detailed above without evidence for osteomyelitis. 2. Nonspecific soft tissue swelling is noted about the foot. 3. Stable foreign body in the plantar soft tissues as detailed above. Electronically Signed   By: CConstance HolsterM.D.   On: 09/12/2019 00:27   ECHOCARDIOGRAM COMPLETE  Result Date: 09/13/2019    ECHOCARDIOGRAM REPORT   Patient Name:   Tim [name] Date of Exam: 09/13/2019 Medical Rec #:  [id number]       Height:       72.0 in Accession #:    [id number]      Weight:       331.1 lb Date of Birth:  1940/08/09       BSA:          2.640 m Patient Age:    86 years        BP:           156/72 mmHg Patient Gender: M               HR:           76 bpm. Exam Location:  Inpatient Procedure: 2D Echo, Color Doppler, Cardiac Doppler and Intracardiac            Opacification Agent Indications:    Bacteremia R78.81  History:        Patient has prior history of Echocardiogram examinations, most                 recent 04/18/2018. CAD, Defibrillator, COPD, Arrythmias:Atrial                 Fibrillation; Risk Factors:Sleep Apnea, Hypertension, Diabetes                 and Dyslipidemia. 63m Sapien S3 TAVR placed on 04/11/16.                 Aortic Valve: 29 mm Edwards Sapien prosthetic, stented (TAVR)                 valve is present in the aortic position.  Procedure Date:                 04/11/16.  Sonographer:    ERaquel SarnaSenior RDCS Referring Phys: [name] CAMPBELL  Sonographer Comments: Technically difficult study due to poor echo windows, suboptimal parasternal window and suboptimal apical window. IMPRESSIONS  1. Image quality suboptimal for the detection of valvular vegetations. Recommend TEE if clinically indicated.  2. Left ventricular ejection fraction, by estimation, is 60 to 65%. The left ventricle has normal function. Left ventricular endocardial border not optimally defined to evaluate regional wall motion. There is mild asymmetric left ventricular hypertrophy. Left ventricular diastolic parameters are consistent with Grade I diastolic dysfunction (impaired relaxation). Elevated left ventricular end-diastolic pressure.  3. Right ventricular systolic function was not well visualized. The right ventricular size is not well visualized. There is normal pulmonary artery systolic pressure.  4. The mitral valve is abnormal. Trivial mitral valve regurgitation. No evidence of mitral stenosis.  5. The aortic valve has been repaired/replaced. Aortic valve regurgitation is trivial. No aortic stenosis is present. There is a 29 mm Edwards Sapien prosthetic (TAVR) valve present in the aortic position. Procedure Date: 04/11/16. Aortic valve mean gradient measures 18.0 mmHg.  6. The inferior vena cava is normal in size with greater than 50% respiratory variability, suggesting right atrial pressure of 3 mmHg. FINDINGS  Left Ventricle: Left ventricular ejection fraction, by estimation, is 60 to 65%. The left ventricle has normal function. Left ventricular endocardial border not optimally defined to evaluate regional wall motion. The left ventricular internal cavity size was normal in size. There is mild asymmetric left ventricular hypertrophy. Left ventricular diastolic parameters are consistent with Grade I diastolic dysfunction (impaired relaxation). Elevated  left  ventricular end-diastolic pressure. Right Ventricle: The right ventricular size is not well visualized. Right vetricular wall thickness was not assessed. Right ventricular systolic function was not well visualized. There is normal pulmonary artery systolic pressure. The tricuspid regurgitant velocity is 1.94 m/s, and with an assumed right atrial pressure of 3 mmHg, the estimated right ventricular systolic pressure is 03.5 mmHg. Left Atrium: Left atrial size was not well visualized. Right Atrium: Right atrial size was not well visualized. Pericardium: There is no evidence of pericardial effusion. Mitral Valve: Mobile subvavular calcification not significantly changed from echocardiogram 01/27/2016 on side by side comparison. The mitral valve is abnormal. Normal mobility of the mitral valve leaflets. Moderate to severe mitral annular calcification.  Trivial mitral valve regurgitation. No evidence of mitral valve stenosis. MV peak gradient, 10.4 mmHg. The mean mitral valve gradient is 3.0 mmHg with average heart rate of 79 bpm. Tricuspid Valve: The tricuspid valve is not well visualized. Tricuspid valve regurgitation is not demonstrated. No evidence of tricuspid stenosis. Aortic Valve: The aortic valve has been repaired/replaced. Aortic valve regurgitation is trivial. No aortic stenosis is present. Aortic valve mean gradient measures 18.0 mmHg. Aortic valve peak gradient measures 30.0 mmHg. Aortic valve area, by VTI measures 1.12 cm. 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 Procedure Date: 04/11/16. Pulmonic Valve: The pulmonic valve was not well visualized. Pulmonic valve regurgitation is not visualized. No evidence of pulmonic stenosis. Aorta: The aortic root was not well visualized. Venous: The inferior vena cava is normal in size with greater than 50% respiratory variability, suggesting right atrial pressure of 3 mmHg. IAS/Shunts: No atrial level shunt  detected by color flow Doppler.  LEFT VENTRICLE PLAX 2D LVIDd:         5.20 cm  Diastology LVIDs:         3.90 cm  LV e' lateral:   5.98 cm/s LV PW:         1.10 cm  LV E/e' lateral: 17.7 LV IVS:        0.80 cm  LV e' medial:    6.20 cm/s LVOT diam:     2.20 cm  LV E/e' medial:  17.1 LV SV:         56 LV SV Index:   21 LVOT Area:     3.80 cm  RIGHT VENTRICLE RV S prime:     10.80 cm/s TAPSE (M-mode): 2.0 cm LEFT ATRIUM            Index       RIGHT ATRIUM           Index LA diam:      4.20 cm  1.59 cm/m  RA Area:     23.80 cm LA Vol (A4C): 141.0 ml 53.41 ml/m RA Volume:   73.50 ml  27.84 ml/m  AORTIC VALVE AV Area (Vmax):    1.10 cm AV Area (Vmean):   1.12 cm AV Area (VTI):     1.12 cm AV Vmax:           274.00 cm/s AV Vmean:          203.000 cm/s AV VTI:            0.500 m AV Peak Grad:      30.0 mmHg AV Mean Grad:      18.0 mmHg LVOT Vmax:         79.20 cm/s LVOT Vmean:  60.000 cm/s LVOT VTI:          0.147 m LVOT/AV VTI ratio: 0.29 MITRAL VALVE                TRICUSPID VALVE MV Area (PHT): 2.73 cm     TR Peak grad:   15.1 mmHg MV Peak grad:  10.4 mmHg    TR Vmax:        194.00 cm/s MV Mean grad:  3.0 mmHg MV Vmax:       1.61 m/s     SHUNTS MV Vmean:      104.0 cm/s   Systemic VTI:  0.15 m MV Decel Time: 278 msec     Systemic Diam: 2.20 cm MV E velocity: 106.00 cm/s MV A velocity: 149.00 cm/s MV E/A ratio:  0.71 Cherlynn Kaiser MD Electronically signed by Cherlynn Kaiser MD Signature Date/Time: 09/13/2019/9:18:49 PM    Final    ECHO TEE  Result Date: 09/16/2019    TRANSESOPHOGEAL ECHO REPORT   Patient Name:   Tim [name] Date of Exam: 09/16/2019 Medical Rec #:  [id number]       Height:       72.0 in Accession #:    [id number]      Weight:       340.4 lb Date of Birth:  1940-10-23       BSA:          2.671 m Patient Age:    35 years        BP:           113/39 mmHg Patient Gender: M               HR:           64 bpm. Exam Location:  Inpatient Procedure: Transesophageal Echo, Cardiac Doppler and  Color Doppler Indications:     Bacteremia  History:         Patient has prior history of Echocardiogram examinations, most                  recent 09/13/2019. CAD, AICD (automatic                  cardioverter/defibrillator) present, COPD, Moderate aortic                  stenosis, Arrythmias:Atrial Fibrillation; Risk                  Factors:Diabetes, Sleep Apnea and Hypertension. S/P TAVR                  (transcatheter aortic valve replacement).  Sonographer:     Vikki Ports Turrentine Referring Phys:  [id number] [place] Diagnosing Phys: Skeet Latch MD PROCEDURE: The transesophogeal probe was passed without difficulty through the esophogus of the patient. Sedation performed by different physician. The patient was monitored while under deep sedation. Anesthestetic sedation was provided intravenously by Anesthesiology: 477m of Propofol. The patient's vital signs; including heart rate, blood pressure, and oxygen saturation; remained stable throughout the procedure. The patient developed no complications during the procedure. IMPRESSIONS  1. Left ventricular ejection fraction, by estimation, is 60 to 65%. The left ventricle has normal function. The left ventricle has no regional wall motion abnormalities.  2. Right ventricular systolic function is normal. The right ventricular size is normal.  3. No left atrial/left atrial appendage thrombus was detected.  4. The mitral valve is normal in structure. Mild mitral valve regurgitation. No evidence  of mitral stenosis.  5. TAVR gradient mildly elevated. Mean gradient 14 mmHg. This is likely underestimated due to the angle of detection. TTE data is more accurate. The aortic valve has been repaired/replaced. Aortic valve regurgitation is not visualized. Aortic valve mean  gradient measures 14.0 mmHg. Aortic valve Vmax measures 2.49 m/s.  6. The inferior vena cava is normal in size with greater than 50% respiratory variability, suggesting right atrial pressure of 3  mmHg.  7. Evidence of atrial level shunting detected by color flow Doppler. There is a small patent foramen ovale with predominantly left to right shunting across the atrial septum. Conclusion(s)/Recommendation(s): No evidence of vegetation/infective endocarditis on this transesophageal echocardiogram. Findings are concerning for an interatrial shunt as detailed above. FINDINGS  Left Ventricle: Left ventricular ejection fraction, by estimation, is 60 to 65%. The left ventricle has normal function. The left ventricle has no regional wall motion abnormalities. The left ventricular internal cavity size was normal in size. There is  no left ventricular hypertrophy. Right Ventricle: The right ventricular size is normal. No increase in right ventricular wall thickness. Right ventricular systolic function is normal. Left Atrium: Left atrial size was normal in size. No left atrial/left atrial appendage thrombus was detected. Right Atrium: Right atrial size was normal in size. Pericardium: There is no evidence of pericardial effusion. Mitral Valve: Mobile density noted on the anterior mitral valve. This is unchanged from echo 01/2016. The mitral valve is normal in structure. There is mild thickening of the mitral valve leaflet(s). There is mild calcification of the mitral valve leaflet(s). Normal mobility of the mitral valve leaflets. Mild mitral annular calcification. Mild mitral valve regurgitation. No evidence of mitral valve stenosis. Tricuspid Valve: The tricuspid valve is normal in structure. Tricuspid valve regurgitation is not demonstrated. No evidence of tricuspid stenosis. Aortic Valve: TAVR gradient mildly elevated. Mean gradient 14 mmHg. This is likely underestimated due to the angle of detection. TTE data is more accurate. The aortic valve has been repaired/replaced. Aortic valve regurgitation is not visualized. Aortic valve mean gradient measures 14.0 mmHg. Aortic valve peak gradient measures 24.8 mmHg. 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 Pulmonic Valve: The pulmonic valve was normal in structure. Pulmonic valve regurgitation is not visualized. No evidence of pulmonic stenosis. Aorta: The aortic root is normal in size and structure. Venous: The inferior vena cava is normal in size with greater than 50% respiratory variability, suggesting right atrial pressure of 3 mmHg. IAS/Shunts: Evidence of atrial level shunting detected by color flow Doppler. A small patent foramen ovale is detected with predominantly left to right shunting across the atrial septum.  AORTIC VALVE AV Vmax:      249.00 cm/s AV Vmean:     169.000 cm/s AV VTI:       0.411 m AV Peak Grad: 24.8 mmHg AV Mean Grad: 14.0 mmHg Skeet Latch MD Electronically signed by Skeet Latch MD Signature Date/Time: 09/16/2019/5:07:05 PM    Final    XR Foot Complete Left  Result Date: 08/27/2019 Three-view radiographs of the left foot shows silver nitrate that extends down to the MTP joint second toe with destructive bony changes  [name] EKG SITE RITE  Result Date: 09/17/2019 If Site Rite image not attached, placement could not be confirmed due to current cardiac rhythm.    TRANSTHORACIC ECHOCARDIOGRAM (3/13) IMPRESSIONS    1. Image quality suboptimal for the detection of valvular vegetations.  Recommend TEE if clinically indicated.  2. Left ventricular ejection fraction, by estimation, is 60 to 65%. The  left ventricle has normal function. Left ventricular endocardial border  not optimally defined to evaluate regional wall motion. There is mild  asymmetric left ventricular  hypertrophy. Left ventricular diastolic parameters are consistent with  Grade I diastolic dysfunction (impaired relaxation). Elevated left  ventricular end-diastolic pressure.  3. Right ventricular systolic function was not well visualized. The right  ventricular size is not well visualized. There is normal  pulmonary artery  systolic pressure.  4. The mitral valve is abnormal. Trivial mitral valve regurgitation. No  evidence of mitral stenosis.  5. The aortic valve has been repaired/replaced. Aortic valve  regurgitation is trivial. No aortic stenosis is present. 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  Procedure Date: 04/11/16. Aortic valve mean  gradient measures 18.0 mmHg.  6. The inferior vena cava is normal in size with greater than 50%  respiratory variability, suggesting right atrial pressure of 3 mmHg.  TRANSESOPHAGEAL ECHOCARDIOGRAM (3/16) IMPRESSIONS    1. Left ventricular ejection fraction, by estimation, is 60 to 65%. The  left ventricle has normal function. The left ventricle has no regional  wall motion abnormalities.  2. Right ventricular systolic function is normal. The right ventricular  size is normal.  3. No left atrial/left atrial appendage thrombus was detected.  4. The mitral valve is normal in structure. Mild mitral valve  regurgitation. No evidence of mitral stenosis.  5. TAVR gradient mildly elevated. Mean gradient 14 mmHg. This is likely  underestimated due to the angle of detection. TTE data is more accurate.  The aortic valve has been repaired/replaced. Aortic valve regurgitation is  not visualized. Aortic valve mean  gradient measures 14.0 mmHg. Aortic valve Vmax measures 2.49 m/s.  6. The inferior vena cava is normal in size with greater than 50%  respiratory variability, suggesting right atrial pressure of 3 mmHg.  7. Evidence of atrial level shunting detected by color flow Doppler.  There is a small patent foramen ovale with predominantly left to right  shunting across the atrial septum.   Conclusion(s)/Recommendation(s): No evidence of vegetation/infective  endocarditis on this transesophageal  echocardiogram. 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     Subjective: No chest pain, dyspnea.  Discharge Exam: Vitals:   09/17/19 1045 09/17/19 1311  BP:  139/73  Pulse:  72  Resp:  18  Temp:    SpO2: 98% 98%   Vitals:   09/17/19 0541 09/17/19 0946 09/17/19 1045 09/17/19 1311  BP: (!) 127/54 (!) 134/59  139/73  Pulse: 62 74  72  Resp: 20 20  18   Temp: 98.4 F (36.9 C) 97.8 F (36.6 C)    TempSrc: Oral Oral    SpO2: 100%  98% 98%  Weight:      Height:        General: Pt is alert, awake, not in acute distress Cardiovascular: RRR, S1/S2 +, no rubs, no gallops. Murmur. Respiratory: CTA bilaterally, no wheezing, no rhonchi Abdominal: Soft, NT, ND, bowel sounds + Extremities: Left foot stump with no erythema. Some stitches have separated. No drainage noted.    The results of significant diagnostics from this hospitalization (including imaging, microbiology, ancillary and laboratory) are listed below for reference.     Microbiology: Recent Results (from the past 240 hour(s))  Urine culture     Status: Abnormal   Collection Time: 09/09/19  5:32 PM  Specimen: Urine, Clean Catch  Result Value Ref Range Status   Specimen Description URINE, CLEAN CATCH  Final   Special Requests   Final    NONE Performed at [facility], [address], [place], [place] [address]    Culture (A)  Final    >=100,000 COLONIES/mL ENTEROCOCCUS FAECALIS >=100,000 COLONIES/mL AEROCOCCUS URINAE    Report Status 09/12/2019 FINAL  Final   Organism ID, Bacteria ENTEROCOCCUS FAECALIS (A)  Final      Susceptibility   Enterococcus faecalis - MIC*    AMPICILLIN <=2 SENSITIVE Sensitive     NITROFURANTOIN <=16 SENSITIVE Sensitive     VANCOMYCIN 1 SENSITIVE Sensitive     * >=100,000 COLONIES/mL ENTEROCOCCUS FAECALIS  Blood Culture (routine x 2)     Status: Abnormal   Collection Time: 09/11/19 11:38 PM   Specimen: BLOOD  Result Value Ref Range Status   Specimen Description   Final    BLOOD RIGHT ANTECUBITAL Performed at [facility] [address] [place], [place] [address]    Special Requests   Final    BOTTLES DRAWN AEROBIC AND ANAEROBIC Blood Culture adequate volume Performed at [facility] [address] [place], [place] [address]    Culture  Setup Time   Final    IN BOTH AEROBIC AND ANAEROBIC BOTTLES GRAM POSITIVE COCCI IN CHAINS CRITICAL RESULT CALLED TO, READ BACK BY AND VERIFIED WITH: N GLOGOVAC PHARMD 09/12/19 1712 JDW    Culture (A)  Final    ENTEROCOCCUS FAECALIS SUSCEPTIBILITIES PERFORMED ON PREVIOUS CULTURE WITHIN THE LAST 5 DAYS. Performed at [facility], [place] [address] [place], [place] [address]    Report Status 09/15/2019 FINAL  Final  Urine culture     Status: Abnormal   Collection Time: 09/11/19 11:38 PM   Specimen: In/Out Cath Urine  Result Value Ref Range Status   Specimen Description   Final    IN/OUT CATH URINE Performed at [facility] [address], [place], [place] [address]    Special Requests   Final    NONE Performed at [facility], [place] [address] [place], [place] [address]    Culture >=100,000 COLONIES/mL ENTEROCOCCUS FAECALIS (A)  Final   Report Status 09/13/2019 FINAL  Final   Organism ID, Bacteria ENTEROCOCCUS FAECALIS (A)  Final      Susceptibility   Enterococcus faecalis - MIC*    AMPICILLIN <=2 SENSITIVE Sensitive     NITROFURANTOIN <=16 SENSITIVE Sensitive     VANCOMYCIN 1 SENSITIVE Sensitive     * >=100,000 COLONIES/mL ENTEROCOCCUS FAECALIS  Blood Culture (routine x 2)     Status: Abnormal   Collection Time: 09/11/19 11:43 PM   Specimen: BLOOD RIGHT FOREARM  Result Value Ref Range Status   Specimen Description   Final    BLOOD RIGHT FOREARM Performed at [facility], [place] [address], [place], [place] [address]    Special Requests   Final    BOTTLES DRAWN AEROBIC ONLY Blood Culture results may not be optimal due to an inadequate volume of blood received in culture  bottles Performed at [facility] [address] [place], [place] [address]    Culture  Setup Time   Final    AEROBIC BOTTLE ONLY GRAM POSITIVE COCCI IN CHAINS CRITICAL RESULT CALLED TO, READ BACK BY AND VERIFIED WITHGuadlupe Spanish [facility] 09/12/19 2100 Performed at [facility], [place],  [place] [address]    Culture ENTEROCOCCUS FAECALIS (A)  Final   Report Status 09/15/2019 FINAL  Final   Organism ID, Bacteria ENTEROCOCCUS FAECALIS  Final      Susceptibility   Enterococcus faecalis - MIC*    AMPICILLIN <=2 SENSITIVE Sensitive     VANCOMYCIN 1 SENSITIVE Sensitive     GENTAMICIN SYNERGY SENSITIVE Sensitive     * ENTEROCOCCUS FAECALIS  Blood Culture ID Panel (Reflexed)     Status: Abnormal   Collection Time: 09/11/19 11:43 PM  Result Value Ref Range Status   Enterococcus species DETECTED (A) NOT DETECTED Final    Comment: CRITICAL RESULT CALLED TO, READ BACK BY AND VERIFIED WITH: N GLOGOVAC PHARMD 09/12/19 2100 JDW    Vancomycin resistance NOT DETECTED NOT DETECTED Final   Listeria monocytogenes NOT DETECTED NOT DETECTED Final   Staphylococcus species NOT DETECTED NOT DETECTED Final   Staphylococcus aureus (BCID) NOT DETECTED NOT DETECTED Final   Streptococcus species NOT DETECTED NOT DETECTED Final   Streptococcus agalactiae NOT DETECTED NOT DETECTED Final   Streptococcus pneumoniae NOT DETECTED NOT DETECTED Final   Streptococcus pyogenes NOT DETECTED NOT DETECTED Final   Acinetobacter baumannii NOT DETECTED NOT DETECTED Final   Enterobacteriaceae species NOT DETECTED NOT DETECTED Final   Enterobacter cloacae complex NOT DETECTED NOT DETECTED Final   Escherichia coli NOT DETECTED NOT DETECTED Final   Klebsiella oxytoca NOT DETECTED NOT DETECTED Final   Klebsiella pneumoniae NOT DETECTED NOT DETECTED Final   Proteus species NOT DETECTED NOT DETECTED Final   Serratia marcescens NOT DETECTED NOT DETECTED Final   Haemophilus influenzae NOT  DETECTED NOT DETECTED Final   Neisseria meningitidis NOT DETECTED NOT DETECTED Final   Pseudomonas aeruginosa NOT DETECTED NOT DETECTED Final   Candida albicans NOT DETECTED NOT DETECTED Final   Candida glabrata NOT DETECTED NOT DETECTED Final   Candida krusei NOT DETECTED NOT DETECTED Final   Candida parapsilosis NOT DETECTED NOT DETECTED Final   Candida tropicalis NOT DETECTED NOT DETECTED Final    Comment: Performed at [facility], [address], [place], [place] [address]  SARS CORONAVIRUS 2 (TAT 6-24 HRS) Nasopharyngeal Nasopharyngeal Swab     Status: None   Collection Time: 09/12/19  1:44 AM   Specimen: Nasopharyngeal Swab  Result Value Ref Range Status   SARS Coronavirus 2 NEGATIVE NEGATIVE Final    Comment: (NOTE) SARS-CoV-2 target nucleic acids are NOT DETECTED. The SARS-CoV-2 RNA is generally detectable in upper and lower respiratory specimens during the acute phase of infection. Negative results do not preclude SARS-CoV-2 infection, do not rule out co-infections with other pathogens, and should not be used as the sole basis for treatment or other patient management decisions. Negative results must be combined with clinical observations, patient history, and epidemiological information. The expected result is Negative. Fact Sheet for Patients: SugarRoll.be Fact Sheet for Healthcare Providers: https://www.woods-mathews.com/ This test is not yet approved or cleared by the [place] FDA and  has been authorized for detection and/or diagnosis of SARS-CoV-2 by FDA under an Emergency Use Authorization (EUA). This EUA will remain  in effect (meaning this test can be used) for the duration of the COVID-19 declaration under Section 56 4(b)(1) of the Act, 21 U.S.C. section 360bbb-3(b)(1), unless the authorization is terminated or revoked sooner. Performed at [facility], [place] [address], [place], [place] [address]   MRSA  PCR Screening     Status: None   Collection Time: 09/12/19 10:03 PM   Specimen:  Nasal Mucosa; Nasopharyngeal  Result Value Ref Range Status   MRSA by PCR NEGATIVE NEGATIVE Final    Comment:        The GeneXpert MRSA Assay (FDA approved for NASAL specimens only), is one component of a comprehensive MRSA colonization surveillance program. It is not intended to diagnose MRSA infection nor to guide or monitor treatment for MRSA infections. Performed at [facility], [place] [address], [place], [place] [address]   Culture, blood (routine x 2)     Status: None (Preliminary result)   Collection Time: 09/13/19 11:43 AM   Specimen: BLOOD  Result Value Ref Range Status   Specimen Description   Final    BLOOD RIGHT HAND Performed at [facility] [address] [place], [place] [address]    Special Requests   Final    BOTTLES DRAWN AEROBIC AND ANAEROBIC Blood Culture adequate volume Performed at [facility] [address] [place], [place] [address]    Culture   Final    NO GROWTH 4 DAYS Performed at [facility], [place] [address] [place], [place] [address]    Report Status PENDING  Incomplete  Culture, blood (routine x 2)     Status: None (Preliminary result)   Collection Time: 09/13/19 11:55 AM   Specimen: BLOOD  Result Value Ref Range Status   Specimen Description   Final    BLOOD RIGHT ANTECUBITAL Performed at [facility] [address], [place], [place] [address]    Special Requests   Final    BOTTLES DRAWN AEROBIC AND ANAEROBIC Blood Culture adequate volume Performed at [facility] [address], [place], [place] [address]    Culture   Final    NO GROWTH 4 DAYS Performed at [facility], [place] [address] [place], [place] [address]    Report Status PENDING  Incomplete     Labs: BNP (last 3 results) No results for input(s): BNP in the last 8760 hours. Basic  Metabolic Panel: Recent Labs  Lab 09/13/19 0633 09/14/19 0529 09/15/19 0452 09/16/19 0607 09/17/19 0456  NA 138 139 138 137 143  K 3.3* 4.1 3.3* 4.5 3.4*  CL 104 105 104 107 109  CO2 25 26 25 23 27   GLUCOSE 108* 110* 132* 130* 120*  BUN 15 16 16 13 12   CREATININE 0.80 0.80 0.76 0.81 0.72  CALCIUM 8.4* 8.1* 8.2* 8.2* 8.4*  MG  --   --   --   --  1.8   Liver Function Tests: Recent Labs  Lab 09/12/19 0454 09/13/19 0633 09/14/19 0529 09/15/19 0452 09/16/19 0607  AST 110* 268* 178* 105* 90*  ALT 90* 183* 153* 123* 99*  ALKPHOS 120 181* 137* 134* 111  BILITOT 1.0 1.0 1.1 0.8 0.5  PROT 5.8* 5.7* 5.3* 5.5* 5.3*  ALBUMIN 2.8* 2.9* 2.4* 2.6* 2.5*   No results for input(s): LIPASE, AMYLASE in the last 168 hours. No results for input(s): AMMONIA in the last 168 hours. CBC: Recent Labs  Lab 09/11/19 2343 09/11/19 2343 09/12/19 0454 09/12/19 0454 09/13/19 0962 09/14/19 0529 09/15/19 0452 09/16/19 0607 09/17/19 0456  WBC 13.9*   < > 12.0*   < > 5.9 6.0 4.4 4.1 4.5  NEUTROABS 11.6*  --  10.0*  --   --   --   --   --   --   HGB 10.8*   < > 10.1*   < > 10.1* 9.8*  9.4* 8.4* 9.1*  HCT 33.9*   < > 31.3*   < > 30.9* 28.8* 29.3* 23.6* 28.3*  MCV 95.8   < > 95.1   < > 93.4 90.0 93.6 87.1 93.1  PLT 127*   < > 117*   < > 109* 86* 124* 117* 132*   < > = values in this interval not displayed.   Cardiac Enzymes: No results for input(s): CKTOTAL, CKMB, CKMBINDEX, TROPONINI in the last 168 hours. BNP: Invalid input(s): POCBNP CBG: Recent Labs  Lab 09/16/19 1420 09/16/19 1622 09/16/19 2202 09/17/19 0820 09/17/19 1210  GLUCAP 114* 119* 119* 115* 113*   D-Dimer No results for input(s): DDIMER in the last 72 hours. Hgb A1c No results for input(s): HGBA1C in the last 72 hours. Lipid Profile No results for input(s): CHOL, HDL, LDLCALC, TRIG, CHOLHDL, LDLDIRECT in the last 72 hours. Thyroid function studies No results for input(s): TSH, T4TOTAL, T3FREE, THYROIDAB in the last 72  hours.  Invalid input(s): FREET3 Anemia work up No results for input(s): VITAMINB12, FOLATE, FERRITIN, TIBC, IRON, RETICCTPCT in the last 72 hours. Urinalysis    Component Value Date/Time   COLORURINE RED (A) 09/11/2019 2338   APPEARANCEUR TURBID (A) 09/11/2019 2338   LABSPEC  09/11/2019 2338    TEST NOT REPORTED DUE TO COLOR INTERFERENCE OF URINE PIGMENT   PHURINE  09/11/2019 2338    TEST NOT REPORTED DUE TO COLOR INTERFERENCE OF URINE PIGMENT   GLUCOSEU (A) 09/11/2019 2338    TEST NOT REPORTED DUE TO COLOR INTERFERENCE OF URINE PIGMENT   GLUCOSEU NEGATIVE 05/25/2006 1353   HGBUR (A) 09/11/2019 2338    TEST NOT REPORTED DUE TO COLOR INTERFERENCE OF URINE PIGMENT   BILIRUBINUR (A) 09/11/2019 2338    TEST NOT REPORTED DUE TO COLOR INTERFERENCE OF URINE PIGMENT   BILIRUBINUR negative 12/25/2018 1408   KETONESUR (A) 09/11/2019 2338    TEST NOT REPORTED DUE TO COLOR INTERFERENCE OF URINE PIGMENT   PROTEINUR (A) 09/11/2019 2338    TEST NOT REPORTED DUE TO COLOR INTERFERENCE OF URINE PIGMENT   UROBILINOGEN 0.2 12/25/2018 1408   UROBILINOGEN 0.2 mg/dL 05/25/2006 1353   NITRITE (A) 09/11/2019 2338    TEST NOT REPORTED DUE TO COLOR INTERFERENCE OF URINE PIGMENT   LEUKOCYTESUR (A) 09/11/2019 2338    TEST NOT REPORTED DUE TO COLOR INTERFERENCE OF URINE PIGMENT   Sepsis Labs Invalid input(s): PROCALCITONIN,  WBC,  LACTICIDVEN Microbiology Recent Results (from the past 240 hour(s))  Urine culture     Status: Abnormal   Collection Time: 09/09/19  5:32 PM   Specimen: Urine, Clean Catch  Result Value Ref Range Status   Specimen Description URINE, CLEAN CATCH  Final   Special Requests   Final    NONE Performed at [facility], [place] [address] [place], [place] [address]    Culture (A)  Final    >=100,000 COLONIES/mL ENTEROCOCCUS FAECALIS >=100,000 COLONIES/mL AEROCOCCUS URINAE    Report Status 09/12/2019 FINAL  Final   Organism ID, Bacteria ENTEROCOCCUS FAECALIS (A)  Final       Susceptibility   Enterococcus faecalis - MIC*    AMPICILLIN <=2 SENSITIVE Sensitive     NITROFURANTOIN <=16 SENSITIVE Sensitive     VANCOMYCIN 1 SENSITIVE Sensitive     * >=100,000 COLONIES/mL ENTEROCOCCUS FAECALIS  Blood Culture (routine x 2)     Status: Abnormal   Collection Time: 09/11/19 11:38 PM   Specimen: BLOOD  Result Value Ref Range Status  Specimen Description   Final    BLOOD RIGHT ANTECUBITAL Performed at [facility] [address], [place], [place] [address]    Special Requests   Final    BOTTLES DRAWN AEROBIC AND ANAEROBIC Blood Culture adequate volume Performed at [facility] [address] [place], [place] [address]    Culture  Setup Time   Final    IN BOTH AEROBIC AND ANAEROBIC BOTTLES GRAM POSITIVE COCCI IN CHAINS CRITICAL RESULT CALLED TO, READ BACK BY AND VERIFIED WITH: N GLOGOVAC PHARMD 09/12/19 1712 JDW    Culture (A)  Final    ENTEROCOCCUS FAECALIS SUSCEPTIBILITIES PERFORMED ON PREVIOUS CULTURE WITHIN THE LAST 5 DAYS. Performed at [facility], [place] [address] [place], [place] [address]    Report Status 09/15/2019 FINAL  Final  Urine culture     Status: Abnormal   Collection Time: 09/11/19 11:38 PM   Specimen: In/Out Cath Urine  Result Value Ref Range Status   Specimen Description   Final    IN/OUT CATH URINE Performed at [facility] [address], [place], [place] [address]    Special Requests   Final    NONE Performed at [facility], [place] [address], [place], [place] [address]    Culture >=100,000 COLONIES/mL ENTEROCOCCUS FAECALIS (A)  Final   Report Status 09/13/2019 FINAL  Final   Organism ID, Bacteria ENTEROCOCCUS FAECALIS (A)  Final      Susceptibility   Enterococcus faecalis - MIC*    AMPICILLIN <=2 SENSITIVE Sensitive     NITROFURANTOIN <=16 SENSITIVE Sensitive     VANCOMYCIN 1 SENSITIVE Sensitive     * >=100,000 COLONIES/mL ENTEROCOCCUS FAECALIS   Blood Culture (routine x 2)     Status: Abnormal   Collection Time: 09/11/19 11:43 PM   Specimen: BLOOD RIGHT FOREARM  Result Value Ref Range Status   Specimen Description   Final    BLOOD RIGHT FOREARM Performed at [facility], [place] [address], [place], [place] [address]    Special Requests   Final    BOTTLES DRAWN AEROBIC ONLY Blood Culture results may not be optimal due to an inadequate volume of blood received in culture bottles Performed at [facility] [address], [place], [place] [address]    Culture  Setup Time   Final    AEROBIC BOTTLE ONLY GRAM POSITIVE COCCI IN CHAINS CRITICAL RESULT CALLED TO, READ BACK BY AND VERIFIED WITHGuadlupe Spanish [facility] 09/12/19 2100 Performed at [facility], [place] [address], [place], [place] [address]    Culture ENTEROCOCCUS FAECALIS (A)  Final   Report Status 09/15/2019 FINAL  Final   Organism ID, Bacteria ENTEROCOCCUS FAECALIS  Final      Susceptibility   Enterococcus faecalis - MIC*    AMPICILLIN <=2 SENSITIVE Sensitive     VANCOMYCIN 1 SENSITIVE Sensitive     GENTAMICIN SYNERGY SENSITIVE Sensitive     * ENTEROCOCCUS FAECALIS  Blood Culture ID Panel (Reflexed)     Status: Abnormal   Collection Time: 09/11/19 11:43 PM  Result Value Ref Range Status   Enterococcus species DETECTED (A) NOT DETECTED Final    Comment: CRITICAL RESULT CALLED TO, READ BACK BY AND VERIFIED WITH: N GLOGOVAC PHARMD 09/12/19 2100 JDW    Vancomycin resistance NOT DETECTED NOT DETECTED Final   Listeria monocytogenes NOT DETECTED NOT DETECTED Final   Staphylococcus species NOT DETECTED NOT DETECTED Final   Staphylococcus aureus (  BCID) NOT DETECTED NOT DETECTED Final   Streptococcus species NOT DETECTED NOT DETECTED Final   Streptococcus agalactiae NOT DETECTED NOT DETECTED Final   Streptococcus pneumoniae NOT DETECTED NOT DETECTED Final   Streptococcus pyogenes NOT DETECTED NOT DETECTED Final   Acinetobacter baumannii NOT DETECTED  NOT DETECTED Final   Enterobacteriaceae species NOT DETECTED NOT DETECTED Final   Enterobacter cloacae complex NOT DETECTED NOT DETECTED Final   Escherichia coli NOT DETECTED NOT DETECTED Final   Klebsiella oxytoca NOT DETECTED NOT DETECTED Final   Klebsiella pneumoniae NOT DETECTED NOT DETECTED Final   Proteus species NOT DETECTED NOT DETECTED Final   Serratia marcescens NOT DETECTED NOT DETECTED Final   Haemophilus influenzae NOT DETECTED NOT DETECTED Final   Neisseria meningitidis NOT DETECTED NOT DETECTED Final   Pseudomonas aeruginosa NOT DETECTED NOT DETECTED Final   Candida albicans NOT DETECTED NOT DETECTED Final   Candida glabrata NOT DETECTED NOT DETECTED Final   Candida krusei NOT DETECTED NOT DETECTED Final   Candida parapsilosis NOT DETECTED NOT DETECTED Final   Candida tropicalis NOT DETECTED NOT DETECTED Final    Comment: Performed at [facility], [place] [address] [place], [place] [address]  SARS CORONAVIRUS 2 (TAT 6-24 HRS) Nasopharyngeal Nasopharyngeal Swab     Status: None   Collection Time: 09/12/19  1:44 AM   Specimen: Nasopharyngeal Swab  Result Value Ref Range Status   SARS Coronavirus 2 NEGATIVE NEGATIVE Final    Comment: (NOTE) SARS-CoV-2 target nucleic acids are NOT DETECTED. The SARS-CoV-2 RNA is generally detectable in upper and lower respiratory specimens during the acute phase of infection. Negative results do not preclude SARS-CoV-2 infection, do not rule out co-infections with other pathogens, and should not be used as the sole basis for treatment or other patient management decisions. Negative results must be combined with clinical observations, patient history, and epidemiological information. The expected result is Negative. Fact Sheet for Patients: SugarRoll.be Fact Sheet for Healthcare Providers: https://www.woods-mathews.com/ This test is not yet approved or cleared by the [place] FDA and   has been authorized for detection and/or diagnosis of SARS-CoV-2 by FDA under an Emergency Use Authorization (EUA). This EUA will remain  in effect (meaning this test can be used) for the duration of the COVID-19 declaration under Section 56 4(b)(1) of the Act, 21 U.S.C. section 360bbb-3(b)(1), unless the authorization is terminated or revoked sooner. Performed at [facility], [place] [address] [place], [place] [address]   MRSA PCR Screening     Status: None   Collection Time: 09/12/19 10:03 PM   Specimen: Nasal Mucosa; Nasopharyngeal  Result Value Ref Range Status   MRSA by PCR NEGATIVE NEGATIVE Final    Comment:        The GeneXpert MRSA Assay (FDA approved for NASAL specimens only), is one component of a comprehensive MRSA colonization surveillance program. It is not intended to diagnose MRSA infection nor to guide or monitor treatment for MRSA infections. Performed at [name], [place] [address] [place], [place] [address]   Culture, blood (routine x 2)     Status: None (Preliminary result)   Collection Time: 09/13/19 11:43 AM   Specimen: BLOOD  Result Value Ref Range Status   Specimen Description   Final    BLOOD RIGHT HAND Performed at [place] [address] [place], [place] [address]    Special Requests   Final    BOTTLES DRAWN AEROBIC AND ANAEROBIC Blood Culture adequate volume  Performed at [name], [place] [address], [place], [place] [address]    Culture   Final    NO GROWTH 4 DAYS Performed at [facility], [place] [address], [place], [place] [address]    Report Status PENDING  Incomplete  Culture, blood (routine x 2)     Status: None (Preliminary result)   Collection Time: 09/13/19 11:55 AM   Specimen: BLOOD  Result Value Ref Range Status   Specimen Description   Final    BLOOD RIGHT ANTECUBITAL Performed at [place] [address] [place], [place]  [address]    Special Requests   Final    BOTTLES DRAWN AEROBIC AND ANAEROBIC Blood Culture adequate volume Performed at [place] [address] [place], [place] [address]    Culture   Final    NO GROWTH 4 DAYS Performed at [facility], [place] [address], [place],  [address]    Report Status PENDING  Incomplete     Time coordinating discharge: 30 minutes  SIGNED:   Cordelia Poche, MD [facility] 09/17/2019, 3:59 PM

## 2019-09-17 NOTE — Progress Notes (Signed)
Physical Therapy Treatment Patient Details Name: Tim [name] MRN: [id number] DOB: 05/31/1941 Today's Date: 09/17/2019    History of Present Illness 79 yo male admitted with sepsis. Recent L foot transmet amputation by Dr Sharol Given 08/29/19. Hx of NSTEMI, AICD, CAD, COPD, SSS, pacemaker, DM, obesity, gout, chronic pain, Afib, osteomyelitis.    PT Comments    Patient making steady progress with acute PT and was able to increase ambulation distance with RW. Pt required education and safe use of RW regarding height and proximity during gait, no overt LOB noted while mobilizing. Patient educated on WBAT status with use of post-op shoe on Lt LE. He will continue to benefit from skilled PT interventions in below setting. Acute PT will follow and progress as able.   Follow Up Recommendations  Home health PT;Supervision/Assistance - 24 hour     Equipment Recommendations  None recommended by PT    Recommendations for Other Services       Precautions / Restrictions Precautions Precautions: Fall Precaution Comments: post op shoe L foot Restrictions LLE Weight Bearing: Weight bearing as tolerated    Mobility  Bed Mobility Overal bed mobility: Needs Assistance Bed Mobility: Supine to Sit     Supine to sit: Min guard;HOB elevated     General bed mobility comments: pt using bed rail and HOB elevated, pt required extra time  Transfers Overall transfer level: Needs assistance Equipment used: Rolling walker (2 wheeled) Transfers: Sit to/from Stand Sit to Stand: Min assist;From elevated surface         General transfer comment: pt required elevated surface to initaite power up from EOB, min assist to complete rise and steady.   Ambulation/Gait Ambulation/Gait assistance: Min assist Gait Distance (Feet): 70 Feet Assistive device: Rolling walker (2 wheeled) Gait Pattern/deviations: Step-through pattern;Decreased step length - left;Decreased stance time - left;Decreased stride  length;Trunk flexed;Wide base of support Gait velocity: decreased   General Gait Details: cues for WBAT status on Lt foot, pt educated on appropriate walker height and cues to maintian safe proximity to RW. pt c/o biceps fatigue with walker at appropriate height but agreeable to ambulate with it set at such height. cues for posture requried throughout.   Stairs      Wheelchair Mobility    Modified Rankin (Stroke Patients Only)       Balance Overall balance assessment: Needs assistance Sitting-balance support: Feet supported Sitting balance-Leahy Scale: Good     Standing balance support: During functional activity;Bilateral upper extremity supported Standing balance-Leahy Scale: Poor               Cognition Arousal/Alertness: Awake/alert Behavior During Therapy: WFL for tasks assessed/performed Overall Cognitive Status: Within Functional Limits for tasks assessed           Exercises      General Comments        Pertinent Vitals/Pain Pain Assessment: No/denies pain           PT Goals (current goals can now be found in the care plan section) Acute Rehab PT Goals Patient Stated Goal: none stated PT Goal Formulation: With patient Time For Goal Achievement: 09/29/19 Potential to Achieve Goals: Good Progress towards PT goals: Progressing toward goals    Frequency    Min 3X/week      PT Plan Current plan remains appropriate    Co-evaluation              AM-PAC PT "6 Clicks" Mobility   Outcome Measure  Help needed turning from your  back to your side while in a flat bed without using bedrails?: A Little Help needed moving from lying on your back to sitting on the side of a flat bed without using bedrails?: A Little Help needed moving to and from a bed to a chair (including a wheelchair)?: A Little Help needed standing up from a chair using your arms (e.g., wheelchair or bedside chair)?: A Little Help needed to walk in hospital room?: A  Little Help needed climbing 3-5 steps with a railing? : A Lot 6 Click Score: 17    End of Session Equipment Utilized During Treatment: Gait belt Activity Tolerance: Patient tolerated treatment well Patient left: in chair;with call bell/phone within reach;with chair alarm set Nurse Communication: Mobility status PT Visit Diagnosis: Muscle weakness (generalized) (M62.81);Difficulty in walking, not elsewhere classified (R26.2)     Time: 8341-9622 PT Time Calculation (min) (ACUTE ONLY): 20 min  Charges:  $Gait Training: 8-22 mins                     Verner Mould, DPT Physical Therapist with [facility] [phone]  09/17/2019 5:19 PM

## 2019-09-17 NOTE — TOC Transition Note (Signed)
Transition of Care [facility] - CM/SW Discharge Note   Patient Details  Name: VIRAAJ VORNDRAN MRN: [id number] Date of Birth: [birth year]  Transition of Care [facility]) CM/SW Contact:  Dessa Phi, RN Phone Number: 09/17/2019, 1:49 PM   Clinical Narrative: Jeannene Patella w/[facility] following for home iv abx initial instruction, & supplies, [facility]HRN-ongoing med instruction,labs,picc flush per protocal. Awaiting for line placement.      Final next level of care: [place] Barriers to Discharge: Continued Medical Work up   Patient Goals and CMS Choice Patient states their goals for this hospitalization and ongoing recovery are:: go home CMS Medicare.gov Compare Post Acute Care list provided to:: Patient Choice offered to / list presented to : Patient  Discharge Placement                       Discharge Plan and Services   Discharge Planning Services: CM Consult                      HH Arranged: RN, IV Antibiotics HH Agency: [facility]HR[facility]-ongoing instruction iv abx.) Date HH Agency Contacted: 09/17/19 Time [place]: [id number] Representative spoke with at [place]: Pam-iv abx supplies,med  Social Determinants of Health ([place]) Interventions     Readmission Risk Interventions No flowsheet data found.

## 2019-09-17 NOTE — Progress Notes (Signed)
ANTICOAGULATION CONSULT NOTE - Follow Up Consult  Pharmacy Consult for Eliquis Indication: hx atrial fibrillation  Allergies  Allergen Reactions  . Brimonidine Tartrate Shortness Of Breath    Alphagan-Shortness of breath  . Brimonidine Tartrate Palpitations  . Brinzolamide Shortness Of Breath    AZOPT- Shortness of breath  . Latanoprost Shortness Of Breath    XALATAN- Shortness of breath  . Nucynta [Tapentadol] Shortness Of Breath  . Sulfa Antibiotics Palpitations  . Timolol Maleate Shortness Of Breath and Other (See Comments)    TIMOPTIC- Aggravated asthma  . Cardizem  [Diltiazem Hcl] Swelling  . Diltiazem Swelling     leg swelling  . Rofecoxib Swelling     VIOXX- leg swelling  . Vancomycin Hives and Other (See Comments)    Possible "Red Man Syndrome"? > hives/blisters  . Codeine Other (See Comments)    Childhood reaction  . Celecoxib Other (See Comments)    CELLBREX-confusion  . Colchicine Diarrhea    diarrhea  . Tape Rash    Patient Measurements: Height: 6' (182.9 cm) Weight: (!) 338 lb 3 oz (153.4 kg) IBW/kg (Calculated) : 77.6 Heparin Dosing Weight:   Vital Signs: Temp: 98.4 F (36.9 C) (03/17 0541) Temp Source: Oral (03/17 0541) BP: 127/54 (03/17 0541) Pulse Rate: 62 (03/17 0541)  Labs: Recent Labs    09/15/19 0452 09/15/19 0452 09/16/19 0607 09/17/19 0456  HGB 9.4*   < > 8.4* 9.1*  HCT 29.3*  --  23.6* 28.3*  PLT 124*  --  117* 132*  CREATININE 0.76  --  0.81 0.72   < > = values in this interval not displayed.    Estimated Creatinine Clearance: 114.3 mL/min (by C-G formula based on SCr of 0.72 mg/dL).   Medications:  - on Eliquis 5 mg bid PTA  Assessment: Patient is a 79 y.o M with hx s/p left transmetatarsal amputation for osteomyelitis (on Aug 29, 2019), balanitis, TAVR and afib on Eliquis PTA, presented to the ED on 3/11 with c/o fever and AMS.  He was subsequently found to have enterococcus UTI and bacteremia and is currently on  ampicillin IV.  TEE on 3/16 was negative for vegetation.  He's currently on his home Eliquis regimen of 4m bid.  Today, 09/17/2019: - cbc stable - no bleeding documented - scr stable   Plan:  - continue Eliquis 5 mg bid - monitor for s/sx bleeding  Rosy Estabrook P 09/17/2019,8:52 AM

## 2019-09-17 NOTE — Discharge Instructions (Signed)
Tim [name],  You were in the hospital with a few things:  Bacteremia: you have been treated with antibiotics and will continue treatment on discharge. You will require continued IV antibiotics and will need to follow-up with infectious disease  Fungal balantitis/urinary retention: you have a foley catheter. You will also be on fluconazole for a couple of days to continue treatment. Please follow-up with Dr. Tresa Moore, the Urologist for continued management.  Please follow-up with your cardiologist as your heart ultrasound was significant for a likely hole in your heart.

## 2019-09-17 NOTE — Progress Notes (Signed)
PHARMACY CONSULT NOTE FOR:  OUTPATIENT  PARENTERAL ANTIBIOTIC THERAPY (OPAT)  Indication: E. Faecalis bacteremia with ICD Regimen: ampicillin 12gm IV daily over 24h continuous infusion End date: 09/26/2019  IV antibiotic discharge orders are pended. To discharging provider:  please sign these orders via discharge navigator,  Select New Orders & click on the button choice - Manage This Unsigned Work.     Thank you for allowing pharmacy to be a part of this patient's care.  Doreene Eland, PharmD, BCPS.   Work Cell: [phone] 09/17/2019 1:26 PM

## 2019-09-17 NOTE — Progress Notes (Signed)
Peripherally Inserted Central Catheter/Midline Placement  The IV Nurse has discussed with the patient and/or persons authorized to consent for the patient, the purpose of this procedure and the potential benefits and risks involved with this procedure.  The benefits include less needle sticks, lab draws from the catheter, and the patient may be discharged home with the catheter. Risks include, but not limited to, infection, bleeding, blood clot (thrombus formation), and puncture of an artery; nerve damage and irregular heartbeat and possibility to perform a PICC exchange if needed/ordered by physician.  Alternatives to this procedure were also discussed.  Bard Power PICC patient education guide, fact sheet on infection prevention and patient information card has been provided to patient /or left at bedside.    PICC/Midline Placement Documentation  PICC Single Lumen 09/17/19 PICC Right Cephalic 44 cm 0 cm (Active)  Indication for Insertion or Continuance of Line Home intravenous therapies (PICC only) 09/17/19 1500  Exposed Catheter (cm) 0 cm 09/17/19 1500  Site Assessment Clean;Dry;Intact 09/17/19 1500  Line Status Flushed;Blood return noted 09/17/19 1500  Dressing Type Transparent 09/17/19 1500  Dressing Status Dry;Clean;Intact;Antimicrobial disc in place 09/17/19 1500  Dressing Intervention New dressing 09/17/19 1500  Dressing Change Due 09/24/19 09/17/19 1500       Christella Noa Albarece 09/17/2019, 3:21 PM

## 2019-09-18 ENCOUNTER — Telehealth: Payer: Self-pay | Admitting: Family Medicine

## 2019-09-18 LAB — CULTURE, BLOOD (ROUTINE X 2)
Culture: NO GROWTH
Culture: NO GROWTH
Special Requests: ADEQUATE
Special Requests: ADEQUATE

## 2019-09-18 NOTE — Telephone Encounter (Signed)
Attempted to contact patient to set up TCM hospital follow up after his discharge 09/17/19.   LMOM for patient to CB.

## 2019-09-19 ENCOUNTER — Encounter: Payer: Self-pay | Admitting: Family Medicine

## 2019-09-19 ENCOUNTER — Other Ambulatory Visit: Payer: Self-pay

## 2019-09-19 NOTE — Telephone Encounter (Signed)
LMOM for pt to CB to discuss recent hospital discharge and schedule to see PCP in one week.

## 2019-09-22 LAB — BASIC METABOLIC PANEL
BUN: 13 (ref 4–21)
CO2: 28 — AB (ref 13–22)
Chloride: 106 (ref 99–108)
Creatinine: 0.6 (ref 0.6–1.3)
Glucose: 138
Potassium: 4.1 (ref 3.4–5.3)
Sodium: 141 (ref 137–147)

## 2019-09-22 LAB — CBC AND DIFFERENTIAL
HCT: 32 — AB (ref 41–53)
Hemoglobin: 10.2 — AB (ref 13.5–17.5)
Neutrophils Absolute: 4
Platelets: 207 (ref 150–399)
WBC: 6.3

## 2019-09-22 LAB — COMPREHENSIVE METABOLIC PANEL
Calcium: 8.7 (ref 8.7–10.7)
GFR calc Af Amer: 108
GFR calc non Af Amer: 94

## 2019-09-22 LAB — CBC: RBC: 3.43 — AB (ref 3.87–5.11)

## 2019-09-23 ENCOUNTER — Other Ambulatory Visit: Payer: Self-pay

## 2019-09-23 ENCOUNTER — Encounter: Payer: Self-pay | Admitting: Family

## 2019-09-23 ENCOUNTER — Ambulatory Visit (INDEPENDENT_AMBULATORY_CARE_PROVIDER_SITE_OTHER): Payer: Medicare Other | Admitting: Physician Assistant

## 2019-09-23 ENCOUNTER — Encounter: Payer: Self-pay | Admitting: Family Medicine

## 2019-09-23 VITALS — Ht 72.0 in | Wt 338.0 lb

## 2019-09-23 DIAGNOSIS — M86272 Subacute osteomyelitis, left ankle and foot: Secondary | ICD-10-CM

## 2019-09-23 NOTE — Progress Notes (Signed)
Office Visit Note   Patient: Tim [name]           Date of Birth: 07-31-1940           MRN: [id number] Visit Date: 09/23/2019              Requested by: Tammi Sou, MD [address] [place],  [place] [address] PCP: Tammi Sou, MD  Chief Complaint  Patient presents with  . Left Foot - Routine Post Op    08/29/19 left transmet amputation       HPI: This is a pleasant gentleman who is now quite 4 weeks status post left transmetatarsal amputation.  He recently got out of the hospital after having an urinary tract infection.  He is currently getting ampicillin via PICC line.  He has no complaints.  He is wearing a shrinker over the stump size 2 XL  Assessment & Plan: Visit Diagnoses: No diagnosis found.  Plan: We will continue to elevate and use the shrinker.  He may wash this daily but should not soak his amputation stump.  Follow-up in about 1 week we will continue to watch for adequate healing to remove the sutures  Follow-Up Instructions: No follow-ups on file.   Ortho Exam  [name], oriented, no adenopathy, well-dressed, normal affect, normal respiratory effort. Focused examination demonstrates amputation stump with some superficial superficial necrosis and maceration of the skin.  There is no foul odor or no purulent drainage.  Mild erythema but no cellulitis.  Moderate amount of soft tissue swelling sutures are intact  Imaging: No results found. No images are attached to the encounter.  Labs: Lab Results  Component Value Date   HGBA1C 6.1 03/27/2019   HGBA1C 6.1 09/16/2018   HGBA1C 6.0 06/06/2018   ESRSEDRATE 20 02/09/2016   ESRSEDRATE 36 (H) 01/28/2014   LABURIC 3.5 (L) 09/10/2013   REPTSTATUS 09/18/2019 FINAL 09/13/2019   CULT  09/13/2019    NO GROWTH 5 DAYS Performed at [facility], [place] [address], [place], [place] [address]    LABORGA ENTEROCOCCUS FAECALIS 09/11/2019     Lab Results  Component Value Date   ALBUMIN  2.5 (L) 09/16/2019   ALBUMIN 2.6 (L) 09/15/2019   ALBUMIN 2.4 (L) 09/14/2019   LABURIC 3.5 (L) 09/10/2013    Lab Results  Component Value Date   MG 1.8 09/17/2019   No results found for: VD25OH  No results found for: PREALBUMIN CBC EXTENDED Latest Ref Rng & Units 09/22/2019 09/17/2019 09/16/2019  WBC - 6.3 4.5 4.1  RBC 3.87 - 5.11 3.43(A) 3.04(L) 2.71(L)  HGB 13.5 - 17.5 10.2(A) 9.1(L) 8.4(L)  HCT 41 - 53 32(A) 28.3(L) 23.6(L)  PLT 150 - 399 207 132(L) 117(L)  NEUTROABS - 4 - -  LYMPHSABS 0.7 - 4.0 K/uL - - -     Body mass index is 45.84 kg/m.  Orders:  No orders of the defined types were placed in this encounter.  No orders of the defined types were placed in this encounter.    Procedures: No procedures performed  Clinical Data: No additional findings.  ROS:  All other systems negative, except as noted in the HPI. Review of Systems  Objective: Vital Signs: Ht 6' (1.829 m)   Wt (!) 338 lb (153.3 kg)   BMI 45.84 kg/m   Specialty Comments:  No specialty comments available.  PMFS History: Patient Active Problem List   Diagnosis Date Noted  . ICD (implantable  cardioverter-defibrillator) battery depletion   . Pressure injury of skin 09/13/2019  . Enterococcal bacteremia 09/13/2019  . AICD (automatic cardioverter/defibrillator) present 09/13/2019  . 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 Urinary retention 09/13/2019  . Phimosis 09/13/2019  . SIRS (systemic inflammatory response syndrome) ([place]) 09/12/2019  . Sepsis ([place]) 09/12/2019  . Enterococcus UTI 09/12/2019  . Cellulitis of foot   . History of transmetatarsal amputation of left foot ([place])   . Subacute osteomyelitis, left ankle and foot ([place])   . 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 Diabetic neuropathy, painful ([place]) 12/26/2016  . Neuropathic pain of foot, left 06/20/2016  . Insulin resistance  07/22/2015  . Status post amputation of left great toe ([place]) 06/08/2015  . Spinal stenosis of lumbar region 02/16/2015  . Hereditary and idiopathic peripheral neuropathy 01/12/2015  . Oral thrush 12/28/2014  . Postural dizziness 05/24/2014  . Paresthesias with subjective weakness 01/30/2014  . Bilateral thigh pain 01/30/2014  . Abdominal wall pain in right upper quadrant 01/22/2014  . Gross hematuria 09/25/2013  . Intertrigo 09/25/2013  . IBS (irritable bowel syndrome) 09/11/2013  . Chronic pain syndrome 08/06/2013  . CAD (coronary artery disease), native coronary artery 12/25/2012  . Paroxysmal atrial fibrillation ([place]) 12/25/2012  . Moderate aortic stenosis 12/25/2012  . Aortic stenosis 12/24/2012  . Personal history of colonic adenoma 10/30/2012  . Diverticulosis of colon (without mention of hemorrhage) 10/30/2012  . Internal hemorrhoids 10/30/2012  . Change in bowel habits intermittent loose stools 10/30/2012  . Routine health maintenance 06/09/2012  . Hereditary peripheral neuropathy 10/10/2011  . Long term current use of anticoagulant - apixiban 08/16/2010  . Dyspnea on exertion 09/08/2009  . HYPERCHOLESTEROLEMIA-PURE 01/13/2009  . Essential hypertension, benign 01/13/2009  . EDEMA 01/13/2009  . GOUT 01/12/2009  . GLAUCOMA 01/12/2009  . Venous insufficiency of both lower extremities 01/12/2009  . COPD (chronic obstructive pulmonary disease) ([place]) 01/12/2009  . VENTRAL HERNIA 01/12/2009  . OSTEOARTHRITIS 01/12/2009  . ARTHRITIS 01/12/2009  . DEGENERATIVE DISC DISEASE 01/12/2009  . CATARACT, HX OF 01/12/2009  . HEART MURMUR, HX OF 01/12/2009  . BENIGN PROSTATIC HYPERTROPHY, HX OF 01/12/2009  . Obesity 01/17/2007  . OBSTRUCTIVE SLEEP APNEA 01/17/2007  . ASTHMA 01/17/2007  . GERD 01/17/2007  . HALLUX VALGUS, ACQUIRED 01/17/2007  . PULMONARY HYPERTENSION, HX OF 01/17/2007   Past Medical History:  Diagnosis Date  . AICD (automatic cardioverter/defibrillator) present     . ASTHMA   . Asthma    as a child  . BPH (benign prostatic hypertrophy)   . CAD (coronary artery disease)    Nonobstructive by 12/2012 cath; then 03/2016 he required BMS to RCA ([facility]).  In-stent restenosis on cath 11/01/16, baloon angioplasty successful--pt to take plavix (no ASA), + eliquis (for PAF).  [name] CATARACT, HX OF   . Chronic combined systolic and diastolic CHF (congestive heart failure) ([place]) 05/2016   Ischemic CM.  04/2018 EF 40-45%, grd I DD.  [name] Chronic pain syndrome    Lumbar DDD; chronic neuropathic pain (DM); has spinal stimulator and sees pain mgmt MD  . Complicated UTI (urinary tract infection) 09/2019   Phimoses, acute urinary retention, entoerococcus UTI, enterococc bacteremia.  F/u blood clx's neg x 5d.    [name] COPD   . Diabetes mellitus type 2 with complications (HCC)    DHR4B jumped from 5.7% to 6.1% 03/2015---started metformin at that time.  DM 2 dx by fasting gluc criteria 2018.  Has  chronic neuropath pain  . Elevated transaminase level 02/2016   Suspect due to fatty liver (documented on u/s 2007). Plan repeat labs 03/2016.  [name] Enterococcal bacteremia 09/2019   Phimoses, acute urinary retention, entoerococcus UTI, enterococc bacteremia.  F/u blood clx's neg x 5d.    . Essential hypertension, benign   . Fatty liver 2007   2007 u/s showed fatty liver with hepatosplenomegaly.  2019 repeat u/s->fatty liver but no cirrhosis or hepatosplenomegaly.  [name] GERD (gastroesophageal reflux disease)   . Glaucoma   . GOUT   . Hallux valgus (acquired)   . HH (hiatus hernia)   . HYPERCHOLESTEROLEMIA-PURE   . Hypogonadism male   . Lumbosacral neuritis   . Lumbosacral spondylosis    Lumbar spinal stenosis with neurogenic claudication--contributes to his chronic pain syndrome  . Normal memory function 08/2014   Neuropsychological testing ([facility]): no cognitive impairment or sign of neurodegenerative disorder.  Likely has adjustment d/o with mixed anxiety/depressed  features and may benefit from low dose SNRI.    [name] Normocytic anemia 03/2016   Mild-pt needs ferritin and vit B12 level checked (as of 03/22/16)  . NSTEMI (non-ST elevated myocardial infarction) ([place]) 03/20/2016   BMS to RCA  . Obesity hypoventilation syndrome ([place])   . Obesity, unspecified   . Orthostatic hypotension   . OSA on CPAP    8 cm H2O  . OSTEOARTHRITIS   . Paroxysmal atrial fibrillation ([place]) 2003    (? chronic?) Off anticoag for a while due to falls.  Then apixaban started 12/2014.  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 Ella Bodo): neuropathic pain->diff to treat, failed nucynta, failed spinal stimul trial, oxycontin hs + tramadol + gabap as of 12/2017 f/u Dr. Letta Pate.  . Personal history of colonic adenoma 10/30/2012   Diminutive adenoma, consider repeat 2019 per GI  . PFO (patent foramen ovale) 09/2019   small, with predominately L to R shunt  . Pneumonia   . Presence of cardiac defibrillator 11/07/2017  . Primary osteoarthritis of both knees    Bone on bone of medial compartments, + signif patellofemoral arth bilat.--supartz inj series started 09/12/17  . PUD (peptic ulcer disease)   . PULMONARY HYPERTENSION, HX OF   . Secondary male hypogonadism 2017  . 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 Shortness of breath    with exertion: much improved s/p TAVR and treatment for CHF.  . Sick sinus syndrome (HCC)    PPM placed  . Thrombocytopenia ([place]) 2018   HSM on 2007 abd u/s---suspect some mild splenic sequestration chronically.  [name] Unspecified glaucoma(365.9)   . Unspecified hereditary and idiopathic peripheral neuropathy approx age 69   bilat LE's, ? left arm, too.  Feet became progressively numb + left foot pain intermittently.  Pt may be trying a spinal stimulator (as of 05/2015)  . VENOUS INSUFFICIENCY    Being followed by Dr. Sharol Given as of 10/2016 for two R LL venous  stasis ulcers/skin tears.  Healed as of 10/30/16 f/u with Dr. Sharol Given.  . VENTRAL HERNIA     Family History  Problem Relation Age of Onset  . Hypertension Mother   . Coronary artery disease Mother   . Heart attack Mother   . Neuropathy Mother   . Pulmonary fibrosis Father        asbestosis    Past Surgical History:  Procedure Laterality Date  .  AMPUTATION Left 04/11/2013   Procedure: AMPUTATION DIGIT Left 3rd toe;  Surgeon: [name], MD;  Location: [facility];  Service: Orthopedics;  Laterality: Left;  Left 3rd toe amputation at [facility]  . AMPUTATION Left 08/29/2019   Procedure: LEFT TRANSMETATARSAL AMPUTATION;  Surgeon: [name], MD;  Location: [facility];  Service: Orthopedics;  Laterality: Left;  . CARDIAC CATHETERIZATION  1997; 03/10/16   1997 Non-obstructive disease.  03/2016 BMS to RCA, with 25% pDiag dz, o/w normal cors per cath 03/07/16.  Cath 11/01/16: in stent restenosis, successful baloon angioplasty.  [name] CARDIAC CATHETERIZATION  12/24/2012   mild < 20% LCx, prox 30% RCA; LVEF 55-65% , moderate pulmonary HTN, moderate AS  . CARDIAC DEFIBRILLATOR PLACEMENT  11/07/2017   [facility] MRI Quad CRT defibrillator  . CARDIOVASCULAR STRESS TEST  05/11/16 ([facility])   Myocardial perfusion imaging:  No ischemia; scar in apex, global hypokinesis, EF 36%.  . Carotid dopplers  03/09/2016   [facility]: no hemodynamically significant stenosis on either side.  . CHOLECYSTECTOMY    . COLONOSCOPY    . COLONOSCOPY [name] 10/30/2012   Procedure: COLONOSCOPY;  Surgeon: [name], MD;  Location: [facility];  Service: Endoscopy;  Laterality: [name];  . CORONARY ANGIOPLASTY WITH STENT PLACEMENT  03/2016; 04/2017   2017-[facility]: BMS to RCA-pt was placed on Brilinta.  04/2017: DES to RCA.  [name] EYE SURGERY Bilateral cataract  . HEMORRHOID SURGERY    . INTRAOCULAR LENS INSERTION Bilateral   . KNEE SURGERY Right   . LEFT AND RIGHT HEART CATHETERIZATION WITH CORONARY ANGIOGRAM [name] 12/24/2012   Procedure: LEFT AND RIGHT HEART  CATHETERIZATION WITH CORONARY ANGIOGRAM;  Surgeon: [name], MD;  Location: [facility] CATH LAB;  Service: Cardiovascular;  Laterality: [name];  . LEG SURGERY Bilateral    lenghtening   . PACEMAKER PLACEMENT  04/13/2016   2nd deg HB after TAVR, pt had DC MDT PPM placed.  [name] SHOULDER ARTHROSCOPY  08/30/2011   Procedure: ARTHROSCOPY SHOULDER;  Surgeon: [name], MD;  Location: [facility];  Service: Orthopedics;  Laterality: Right;  Right Shoulder Arthroscopy, Debridement, and Decompression  . SPINAL CORD STIMULATOR INSERTION [name] 09/10/2015   Procedure: LUMBAR SPINAL CORD STIMULATOR INSERTION;  Surgeon: [name], MD;  Location: [facility];  Service: Neurosurgery;  Laterality: [name];  . TEE WITHOUT CARDIOVERSION [name] 09/16/2019   Procedure: TRANSESOPHAGEAL ECHOCARDIOGRAM (TEE);  Surgeon: Skeet Latch, MD;  Location: [place];  Service: Cardiovascular;  Laterality: [name];  . 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  R big toe surg due to osteoarth  . TONSILLECTOMY    . traeculectomy Left    eye  . TRANSCATHETER AORTIC VALVE REPLACEMENT, TRANSFEMORAL  04/11/2016  . TRANSESOPHAGEAL ECHOCARDIOGRAM  03/09/2016; 09/2019   [facility]: EF 55-60%, PFO seen with bi-directional shunting, no thrombus in appendage.  09/2019 ->no valvular vegetations. Small patent foramen ovale with predominantly left to right shunting across the interatrial septum.  . TRANSTHORACIC ECHOCARDIOGRAM  01/2015; 01/2016; 05/18/16; 09/18/16, 05/2017, 08/2017   01/2015 No signif change in aortic stenosis (moderate).  01/2016 Severe LVH w/small LV cavity, EF 60-65%, grade I diast dysfxn.  05/2016 (s/p TAVR): EF 50-55%, grd I DD, biopros AV good.  08/2016--EF 50-55%, LV septal motion c/w conduction abnl, grd I DD,mild MS,bioprosth aortic valve well seated, w/trace AR. 05/2017 TTE EF 35%. 08/2017-EF 35%, mod diff hypokin LV, grd I DD, biopros AV good.   . TRANSTHORACIC ECHOCARDIOGRAM  04/2018; 09/2019   04/2018: EF 40-45%, mod diffuse LV  hypokinesis, grd I DD, bioprosth AV well seated, no AS or AR. 09/2019 EF 60-65%, grd I DD, valves fine, including bioprosth AV  . VITRECTOMY     Social History   Occupational History  . Occupation: Engineer, production    Comment: retired  Tobacco Use  . Smoking status: Never Smoker  . Smokeless tobacco: Never Used  Substance and Sexual Activity  . Alcohol use: No    Alcohol/week: 0.0 standard drinks  . Drug use: No    Types: Oxycodone  . Sexual activity: Not Currently

## 2019-09-24 ENCOUNTER — Encounter: Payer: Self-pay | Admitting: Family Medicine

## 2019-09-24 ENCOUNTER — Inpatient Hospital Stay: Payer: Medicare Other | Admitting: Family Medicine

## 2019-09-24 ENCOUNTER — Other Ambulatory Visit: Payer: Self-pay | Admitting: Family Medicine

## 2019-09-30 ENCOUNTER — Ambulatory Visit: Payer: Medicare Other | Admitting: Physician Assistant

## 2019-10-02 ENCOUNTER — Encounter: Payer: Self-pay | Admitting: Family Medicine

## 2019-10-02 ENCOUNTER — Ambulatory Visit: Payer: Medicare Other | Admitting: Family Medicine

## 2019-10-02 ENCOUNTER — Other Ambulatory Visit: Payer: Self-pay

## 2019-10-02 VITALS — BP 84/49 | HR 74 | Temp 97.9°F | Resp 16 | Ht 72.0 in

## 2019-10-02 DIAGNOSIS — D649 Anemia, unspecified: Secondary | ICD-10-CM

## 2019-10-02 DIAGNOSIS — Z7901 Long term (current) use of anticoagulants: Secondary | ICD-10-CM

## 2019-10-02 DIAGNOSIS — N390 Urinary tract infection, site not specified: Secondary | ICD-10-CM | POA: Diagnosis not present

## 2019-10-02 DIAGNOSIS — Z8619 Personal history of other infectious and parasitic diseases: Secondary | ICD-10-CM | POA: Diagnosis not present

## 2019-10-02 DIAGNOSIS — I5022 Chronic systolic (congestive) heart failure: Secondary | ICD-10-CM | POA: Diagnosis not present

## 2019-10-02 DIAGNOSIS — E119 Type 2 diabetes mellitus without complications: Secondary | ICD-10-CM | POA: Diagnosis not present

## 2019-10-02 DIAGNOSIS — E8881 Metabolic syndrome: Secondary | ICD-10-CM | POA: Diagnosis not present

## 2019-10-02 DIAGNOSIS — E118 Type 2 diabetes mellitus with unspecified complications: Secondary | ICD-10-CM

## 2019-10-02 DIAGNOSIS — E88819 Insulin resistance, unspecified: Secondary | ICD-10-CM

## 2019-10-02 DIAGNOSIS — R7401 Elevation of levels of liver transaminase levels: Secondary | ICD-10-CM

## 2019-10-02 DIAGNOSIS — R338 Other retention of urine: Secondary | ICD-10-CM

## 2019-10-02 DIAGNOSIS — R42 Dizziness and giddiness: Secondary | ICD-10-CM

## 2019-10-02 DIAGNOSIS — I959 Hypotension, unspecified: Secondary | ICD-10-CM

## 2019-10-02 LAB — CBC WITH DIFFERENTIAL/PLATELET
Basophils Absolute: 0.1 10*3/uL (ref 0.0–0.1)
Basophils Relative: 0.9 % (ref 0.0–3.0)
Eosinophils Absolute: 0.1 10*3/uL (ref 0.0–0.7)
Eosinophils Relative: 1.2 % (ref 0.0–5.0)
HCT: 35.8 % — ABNORMAL LOW (ref 39.0–52.0)
Hemoglobin: 12 g/dL — ABNORMAL LOW (ref 13.0–17.0)
Lymphocytes Relative: 15.2 % (ref 12.0–46.0)
Lymphs Abs: 1 10*3/uL (ref 0.7–4.0)
MCHC: 33.6 g/dL (ref 30.0–36.0)
MCV: 90.3 fl (ref 78.0–100.0)
Monocytes Absolute: 0.6 10*3/uL (ref 0.1–1.0)
Monocytes Relative: 9.1 % (ref 3.0–12.0)
Neutro Abs: 4.9 10*3/uL (ref 1.4–7.7)
Neutrophils Relative %: 73.6 % (ref 43.0–77.0)
Platelets: 181 10*3/uL (ref 150.0–400.0)
RBC: 3.96 Mil/uL — ABNORMAL LOW (ref 4.22–5.81)
RDW: 15.9 % — ABNORMAL HIGH (ref 11.5–15.5)
WBC: 6.7 10*3/uL (ref 4.0–10.5)

## 2019-10-02 LAB — HEMOGLOBIN A1C: Hgb A1c MFr Bld: 5.6 % (ref 4.6–6.5)

## 2019-10-02 NOTE — Progress Notes (Signed)
10/02/2019  CC:  Chief Complaint  Patient presents with  . Hospitalization Follow-up    Patient is a 79 y.o. Caucasian male who presents for  hospital follow up. Dates hospitalized: 3/11-3/17, 2021. Days since d/c from hospital: 15 days Patient was discharged from hospital to home. Reason for admission to hospital:  Fever/chills, altered mental status, found to have UTI (enterococcus) with sepsis.  I have reviewed patient's discharge summary plus pertinent specific notes, labs, and imaging from the hospitalization.    He finished ampicillin via [place], central line has been pulled. For at least a week now (hard to tell), every time he stands he gets signif lightheaded feeling, almost passes out.  He's hardly able to even use his walker.  Does not use his WC at home.   No fever or chills.  Has indwelling foley cath.  No ureteral pain.  Urine in bag sometimes reddish color, sometimes clear.  No clots in urine. Eating and drinking well.  No CP or SOB. Home bp's typically mid 44R diastolic, syst 154-008.  F/u with urol since hosp x 1, foley not d/c'd yet. Has f/u again in 1 week. Saw ortho 10d ago and has another f/u in 4d with him (Dr. Sharol Given). Has not seen his cardiologist yet since being out of the hospital.   Medication reconciliation was done today and patient is taking meds as recommended by discharging hospitalist/specialist.    PMH:  Past Medical History:  Diagnosis Date  . AICD (automatic cardioverter/defibrillator) present   . ASTHMA   . Asthma    as a child  . BPH (benign prostatic hypertrophy)   . CAD (coronary artery disease)    Nonobstructive by 12/2012 cath; then 03/2016 he required BMS to RCA ([facility]).  In-stent restenosis on cath 11/01/16, baloon angioplasty successful--pt to take plavix (no ASA), + eliquis (for PAF).  [name] CATARACT, HX OF   . Chronic combined systolic and diastolic CHF (congestive heart failure) ([place]) 05/2016   Ischemic CM.  04/2018 EF 40-45%, grd I  DD.  [name] Chronic pain syndrome    Lumbar DDD; chronic neuropathic pain (DM); has spinal stimulator and sees pain mgmt MD  . Complicated UTI (urinary tract infection) 09/2019   Phimoses, acute urinary retention, entoerococcus UTI, enterococc bacteremia.  F/u blood clx's neg x 5d.    [name] COPD   . Diabetes mellitus type 2 with complications (HCC)    QPY1P jumped from 5.7% to 6.1% 03/2015---started metformin at that time.  DM 2 dx by fasting gluc criteria 2018.  Has chronic neuropath pain  . Elevated transaminase level 02/2016   Suspect due to fatty liver (documented on u/s 2007). Plan repeat labs 03/2016.  [name] Enterococcal bacteremia 09/2019   Phimoses, acute urinary retention, entoerococcus UTI, enterococc bacteremia.  F/u blood clx's neg x 5d.    . Essential hypertension, benign   . Fatty liver 2007   2007 u/s showed fatty liver with hepatosplenomegaly.  2019 repeat u/s->fatty liver but no cirrhosis or hepatosplenomegaly.  [name] GERD (gastroesophageal reflux disease)   . Glaucoma   . GOUT   . Hallux valgus (acquired)   . HH (hiatus hernia)   . HYPERCHOLESTEROLEMIA-PURE   . Hypogonadism male   . Lumbosacral neuritis   . Lumbosacral spondylosis    Lumbar spinal stenosis with neurogenic claudication--contributes to his chronic pain syndrome  . Normal memory function 08/2014   Neuropsychological testing ([facility]): no cognitive impairment or sign of neurodegenerative disorder.  Likely has adjustment d/o with  mixed anxiety/depressed features and may benefit from low dose SNRI.    [name] Normocytic anemia 03/2016   Mild-pt needs ferritin and vit B12 level checked (as of 03/22/16). Hb stable 09/2019  . NSTEMI (non-ST elevated myocardial infarction) ([place]) 03/20/2016   BMS to RCA  . Obesity hypoventilation syndrome ([place])   . Obesity, unspecified   . Orthostatic hypotension   . OSA on CPAP    8 cm H2O  . OSTEOARTHRITIS   . Paroxysmal atrial fibrillation ([place]) 2003    (? chronic?) Off anticoag  for a while due to falls.  Then apixaban started 12/2014.  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 Ella Bodo): neuropathic pain->diff to treat, failed nucynta, failed spinal stimul trial, oxycontin hs + tramadol + gabap as of 12/2017 f/u Dr. Letta Pate.  . Personal history of colonic adenoma 10/30/2012   Diminutive adenoma, consider repeat 2019 per GI  . PFO (patent foramen ovale) 09/2019   small, with predominately L to R shunt  . Pneumonia   . Presence of cardiac defibrillator 11/07/2017  . Primary osteoarthritis of both knees    Bone on bone of medial compartments, + signif patellofemoral arth bilat.--supartz inj series started 09/12/17  . PUD (peptic ulcer disease)   . PULMONARY HYPERTENSION, HX OF   . Secondary male hypogonadism 2017  . 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 Shortness of breath    with exertion: much improved s/p TAVR and treatment for CHF.  . Sick sinus syndrome (HCC)    PPM placed  . Thrombocytopenia ([place]) 2018   HSM on 2007 abd u/s---suspect some mild splenic sequestration chronically.  [name] Unspecified glaucoma(365.9)   . Unspecified hereditary and idiopathic peripheral neuropathy approx age 50   bilat LE's, ? left arm, too.  Feet became progressively numb + left foot pain intermittently.  Pt may be trying a spinal stimulator (as of 05/2015)  . VENOUS INSUFFICIENCY    Being followed by Dr. Sharol Given as of 10/2016 for two R LL venous stasis ulcers/skin tears.  Healed as of 10/30/16 f/u with Dr. Sharol Given.  . VENTRAL HERNIA     PSH:  Past Surgical History:  Procedure Laterality Date  . AMPUTATION Left 04/11/2013   Procedure: AMPUTATION DIGIT Left 3rd toe;  Surgeon: [name], MD;  Location: [facility];  Service: Orthopedics;  Laterality: Left;  Left 3rd toe amputation at [facility]  . AMPUTATION Left 08/29/2019   Procedure: LEFT TRANSMETATARSAL AMPUTATION;  Surgeon: [name],  MD;  Location: [facility];  Service: Orthopedics;  Laterality: Left;  . CARDIAC CATHETERIZATION  1997; 03/10/16   1997 Non-obstructive disease.  03/2016 BMS to RCA, with 25% pDiag dz, o/w normal cors per cath 03/07/16.  Cath 11/01/16: in stent restenosis, successful baloon angioplasty.  [name] CARDIAC CATHETERIZATION  12/24/2012   mild < 20% LCx, prox 30% RCA; LVEF 55-65% , moderate pulmonary HTN, moderate AS  . CARDIAC DEFIBRILLATOR PLACEMENT  11/07/2017   [facility] MRI Quad CRT defibrillator  . CARDIOVASCULAR STRESS TEST  05/11/16 ([facility])   Myocardial perfusion imaging:  No ischemia; scar in apex, global hypokinesis, EF 36%.  . Carotid dopplers  03/09/2016   [facility]: no hemodynamically significant stenosis on either side.  . CHOLECYSTECTOMY    . COLONOSCOPY    . COLONOSCOPY [name] 10/30/2012   Procedure: COLONOSCOPY;  Surgeon: [name], MD;  Location: [facility];  Service: Endoscopy;  Laterality: [name];  . CORONARY ANGIOPLASTY WITH STENT PLACEMENT  03/2016; 04/2017   2017-[facility]: BMS to RCA-pt was placed on Brilinta.  04/2017: DES to RCA.  [name] EYE SURGERY Bilateral cataract  . HEMORRHOID SURGERY    . INTRAOCULAR LENS INSERTION Bilateral   . KNEE SURGERY Right   . LEFT AND RIGHT HEART CATHETERIZATION WITH CORONARY ANGIOGRAM [name] 12/24/2012   Procedure: LEFT AND RIGHT HEART CATHETERIZATION WITH CORONARY ANGIOGRAM;  Surgeon: [name], MD;  Location: [facility] CATH LAB;  Service: Cardiovascular;  Laterality: [name];  . LEG SURGERY Bilateral    lenghtening   . PACEMAKER PLACEMENT  04/13/2016   2nd deg HB after TAVR, pt had DC MDT PPM placed.  [name] SHOULDER ARTHROSCOPY  08/30/2011   Procedure: ARTHROSCOPY SHOULDER;  Surgeon: [name], MD;  Location: [facility];  Service: Orthopedics;  Laterality: Right;  Right Shoulder Arthroscopy, Debridement, and Decompression  . SPINAL CORD STIMULATOR INSERTION [name] 09/10/2015   Procedure: LUMBAR SPINAL CORD STIMULATOR INSERTION;  Surgeon: [name], MD;  Location: [facility] NEURO ORS;   Service: Neurosurgery;  Laterality: [name];  . TEE WITHOUT CARDIOVERSION [name] 09/16/2019   Procedure: TRANSESOPHAGEAL ECHOCARDIOGRAM (TEE);  Surgeon: Skeet Latch, MD;  Location: [facility];  Service: Cardiovascular;  Laterality: [name];  . 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  R big toe surg due to osteoarth  . TONSILLECTOMY    . traeculectomy Left    eye  . TRANSCATHETER AORTIC VALVE REPLACEMENT, TRANSFEMORAL  04/11/2016  . TRANSESOPHAGEAL ECHOCARDIOGRAM  03/09/2016; 09/2019   [facility]: EF 55-60%, PFO seen with bi-directional shunting, no thrombus in appendage.  09/2019 ->no valvular vegetations. Small patent foramen ovale with predominantly left to right shunting across the interatrial septum.  . TRANSTHORACIC ECHOCARDIOGRAM  01/2015; 01/2016; 05/18/16; 09/18/16, 05/2017, 08/2017   01/2015 No signif change in aortic stenosis (moderate).  01/2016 Severe LVH w/small LV cavity, EF 60-65%, grade I diast dysfxn.  05/2016 (s/p TAVR): EF 50-55%, grd I DD, biopros AV good.  08/2016--EF 50-55%, LV septal motion c/w conduction abnl, grd I DD,mild MS,bioprosth aortic valve well seated, w/trace AR. 05/2017 TTE EF 35%. 08/2017-EF 35%, mod diff hypokin LV, grd I DD, biopros AV good.   . TRANSTHORACIC ECHOCARDIOGRAM  04/2018; 09/2019   04/2018: EF 40-45%, mod diffuse LV hypokinesis, grd I DD, bioprosth AV well seated, no AS or AR. 09/2019 EF 60-65%, grd I DD, valves fine, including bioprosth AV  . VITRECTOMY      MEDS:  Outpatient Medications Prior to Visit  Medication Sig Dispense Refill  . albuterol (PROVENTIL HFA;VENTOLIN HFA) 108 (90 BASE) MCG/ACT inhaler Inhale 2 puffs into the lungs 4 (four) times daily as needed for wheezing or shortness of breath.    . allopurinol (ZYLOPRIM) 300 MG tablet TAKE 1 TABLET BY MOUTH ONCE DAILY WITH FOOD (Patient taking differently: Take 300 mg by mouth daily. ) 90 tablet 1  . apixaban (ELIQUIS) 5 MG TABS tablet Take 1 tablet (5 mg total) by mouth 2 (two) times daily. 60  tablet 0  . ASPIRIN LOW DOSE 81 MG EC tablet Take 81 mg by mouth daily.    . B Complex-C (B-COMPLEX WITH VITAMIN C) tablet Take 1 tablet by mouth daily. Take 1 tablet daily, 218m    . ezetimibe (ZETIA) 10 MG tablet take 1 tablet by mouth once daily (Patient taking differently: Take 10 mg by mouth daily. ) 90 tablet 3  . finasteride (PROSCAR) 5 MG tablet TAKE 1 TABLET BY MOUTH EVERY  DAY (Patient taking differently: Take 5 mg by mouth daily. ) 90 tablet 0  . fluticasone (FLONASE) 50 MCG/ACT nasal spray Place 2 sprays into both nostrils as needed for allergies or rhinitis.    [name] gabapentin (NEURONTIN) 800 MG tablet TAKE 1 TABLET (800 MG TOTAL) BY MOUTH 4 (FOUR) TIMES DAILY. 360 tablet 2  . isosorbide mononitrate (IMDUR) 60 MG 24 hr tablet TAKE 1 TABLET BY MOUTH EVERY DAY (Patient taking differently: Take 60 mg by mouth daily. ) 90 tablet 3  . metoprolol succinate (TOPROL-XL) 50 MG 24 hr tablet Take 50 mg by mouth daily. Take along with the 68m tablet to make total dose of 722m   . Multiple Vitamin (MULTIVITAMIN) capsule Take 1 capsule by mouth daily.    . niacin (NIASPAN) 1000 MG CR tablet TAKE 1 TABLET BY MOUTH TWICE A DAY (Patient taking differently: Take 1,000 mg by mouth 2 (two) times daily. ) 180 tablet 1  . nystatin (MYCOSTATIN/NYSTOP) powder Apply 1 application topically 3 (three) times daily. 15 g 0  . omeprazole (PRILOSEC) 10 MG capsule TAKE 1 CAPSULE BY MOUTH EVERY DAY 90 capsule 0  . rosuvastatin (CRESTOR) 40 MG tablet take 1 tablet by mouth once daily (Patient taking differently: Take 40 mg by mouth daily. ) 15 tablet 0  . sacubitril-valsartan (ENTRESTO) 24-26 MG Take 1 tablet by mouth 2 (two) times daily.    . Marland KitchenITAMIN D, CHOLECALCIFEROL, PO Take 1 tablet by mouth daily.     . cyclobenzaprine (FLEXERIL) 5 MG tablet Take 0.5 tablet to 1 tablet at bedtime  for muscle spasm. (Patient not taking: Reported on 10/02/2019) 30 tablet 1  . nitroGLYCERIN (NITROSTAT) 0.4 MG SL tablet Place 0.4 mg  under the tongue every 5 (five) minutes as needed for chest pain.     . Marland Kitchenmpicillin (OMNIPEN) 2 g injection      No facility-administered medications prior to visit.   EXAM: BP (!) 84/49 (BP Location: Left Arm, Patient Position: Sitting, Cuff Size: Large)   Pulse 74   Temp 97.9 F (36.6 C) (Temporal)   Resp 16   Ht 6' (1.829 m)   SpO2 99%   BMI 45.84 kg/m  Gen: alert and chronically ill-appearing, in NAD. AFFECT: pleasant, moderately flat affect as per his usual, lucid thought and speech. ENGBM:BOMQno injection, icteris, swelling, or exudate.  EOMI, PERRLA. Mouth: lips without lesion/swelling.  Oral mucosa pink and moist. Oropharynx without erythema, exudate, or swelling.  CV: RRR, soft systolic murmur, no diastolic murmur, no r/g.   LUNGS: CTA bilat, nonlabored resps, good aeration in all lung fields. ABD: soft, NT EXT: no clubbing or cyanosis.  Trace pitting edema bilat LLs, L foot in wrapping/gauze and post-op shoe.  No sign of leg cellulitis.   Pertinent labs/imaging   Chemistry      Component Value Date/Time   NA 141 09/22/2019 0000   K 4.1 09/22/2019 0000   CL 106 09/22/2019 0000   CO2 28 (A) 09/22/2019 0000   BUN 13 09/22/2019 0000   CREATININE 0.6 09/22/2019 0000   CREATININE 0.72 09/17/2019 0456   CREATININE 0.71 02/09/2016 1333   GLU 138 09/22/2019 0000      Component Value Date/Time   CALCIUM 8.7 09/22/2019 0000   ALKPHOS 111 09/16/2019 0607   AST 90 (H) 09/16/2019 0607   ALT 99 (H) 09/16/2019 0607   BILITOT 0.5 09/16/2019 0607     Lab Results  Component Value Date   HGBA1C 6.1 03/27/2019  ASSESSMENT/PLAN:  1) Urosepsis: much improved.  BPH with LUTS/acute urinary retention--> continuing indwelling foley cath and has approp urology f/u. CBC and CMET today.  2) Low bp/Orthostatic hypotension, likely related to HF meds and CV derangements in setting of recent sepsis. Want to minimize this as much as possible b/c pt high fall risk yet he is on DOAC for  SSS. Take your 54m toprol pill but stop your 25mpill. Take HALF of your entresto tab twice per day. Needs f/u with his cardiologist. CBC and CMET today.  3) Cardiac->CAD, hx of MI, ischemic CM (comb syst/diast HF), sick sinus syndrome, and s/p TAVR: has ICD, on max med mgmt.  No sign of fluid overload. Entresto and toprol dose changes as per #2 above. Lytes/cr today. Needs to arrange cardiologist f/u.  4) DM 2, with DPN. Hx of L foot transmetatarsal ambuptation 08/29/19. F/u with Dr. DuSharol Givenoon already set. A1c and lytes/cr today.  5) NASH: hepatic panel today.  FOLLOW UP:  [facility] f/u (but make f/u appt with me in 10-14d if unable to see your cardiologist by then)  Signed:  PhCrissie SicklesMD           10/02/2019

## 2019-10-02 NOTE — Patient Instructions (Signed)
Take your 62m toprol pill but stop your 267mpill. Take HALF of your entresto tab twice per day.

## 2019-10-03 LAB — COMPREHENSIVE METABOLIC PANEL
AG Ratio: 1.2 (calc) (ref 1.0–2.5)
ALT: 22 U/L (ref 9–46)
AST: 31 U/L (ref 10–35)
Albumin: 3.3 g/dL — ABNORMAL LOW (ref 3.6–5.1)
Alkaline phosphatase (APISO): 92 U/L (ref 35–144)
BUN: 19 mg/dL (ref 7–25)
CO2: 24 mmol/L (ref 20–32)
Calcium: 8.8 mg/dL (ref 8.6–10.3)
Chloride: 105 mmol/L (ref 98–110)
Creat: 0.88 mg/dL (ref 0.70–1.18)
Globulin: 2.7 g/dL (calc) (ref 1.9–3.7)
Glucose, Bld: 212 mg/dL — ABNORMAL HIGH (ref 65–99)
Potassium: 4.2 mmol/L (ref 3.5–5.3)
Sodium: 138 mmol/L (ref 135–146)
Total Bilirubin: 0.7 mg/dL (ref 0.2–1.2)
Total Protein: 6 g/dL — ABNORMAL LOW (ref 6.1–8.1)

## 2019-10-06 ENCOUNTER — Encounter: Payer: Self-pay | Admitting: Physician Assistant

## 2019-10-06 ENCOUNTER — Ambulatory Visit (INDEPENDENT_AMBULATORY_CARE_PROVIDER_SITE_OTHER): Payer: Medicare Other | Admitting: Orthopedic Surgery

## 2019-10-06 ENCOUNTER — Other Ambulatory Visit: Payer: Self-pay

## 2019-10-06 DIAGNOSIS — Q211 Atrial septal defect: Secondary | ICD-10-CM | POA: Insufficient documentation

## 2019-10-06 DIAGNOSIS — L97929 Non-pressure chronic ulcer of unspecified part of left lower leg with unspecified severity: Secondary | ICD-10-CM

## 2019-10-06 DIAGNOSIS — I87332 Chronic venous hypertension (idiopathic) with ulcer and inflammation of left lower extremity: Secondary | ICD-10-CM | POA: Diagnosis not present

## 2019-10-06 DIAGNOSIS — Z89432 Acquired absence of left foot: Secondary | ICD-10-CM

## 2019-10-06 DIAGNOSIS — Q2112 Patent foramen ovale: Secondary | ICD-10-CM | POA: Insufficient documentation

## 2019-10-07 ENCOUNTER — Encounter: Payer: Self-pay | Admitting: Orthopedic Surgery

## 2019-10-07 NOTE — Progress Notes (Signed)
Office Visit Note   Patient: Tim [name]           Date of Birth: 06-Nov-1940           MRN: [id number] Visit Date: 10/06/2019              Requested by: Tammi Sou, MD [id number] [place],  [place] [address] PCP: Tammi Sou, MD  Chief Complaint  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  Assessment & Plan: Visit Diagnoses:  1. Chronic venous hypertension (idiopathic) with ulcer and inflammation of left lower extremity (HCC)     Plan: We will continue with the compression wrap due to the massive venous stasis swelling and slight wound dehiscence.  Apply silver cell to the wound Dynaflex wrap  Follow-Up Instructions: Return in about 1 week (around 10/13/2019).   Ortho Exam  [name], oriented, no adenopathy, well-dressed, normal affect, normal respiratory effort. Examination the surgical incision is 50% healed 50% has healthy granulation tissue there is gaped open about 5 mm about 3 cm in length there is no exposed bone or tendon no drainage no cellulitis he has massive swelling in the left foot and left leg.  Imaging: No results found. No images are attached to the encounter.  Labs: Lab Results  Component Value Date   HGBA1C 5.6 10/02/2019   HGBA1C 6.1 03/27/2019   HGBA1C 6.1 09/16/2018   ESRSEDRATE 20 02/09/2016   ESRSEDRATE 36 (H) 01/28/2014   LABURIC 3.5 (L) 09/10/2013   REPTSTATUS 09/18/2019 FINAL 09/13/2019   CULT  09/13/2019    NO GROWTH 5 DAYS Performed at [facility], [place] [address], [place], [place] [address]    LABORGA  ENTEROCOCCUS FAECALIS 09/11/2019     Lab Results  Component Value Date   ALBUMIN 2.5 (L) 09/16/2019   ALBUMIN 2.6 (L) 09/15/2019   ALBUMIN 2.4 (L) 09/14/2019   LABURIC 3.5 (L) 09/10/2013    Lab Results  Component Value Date   MG 1.8 09/17/2019   No results found for: VD25OH  No results found for: PREALBUMIN CBC EXTENDED Latest Ref Rng & Units 10/02/2019 09/22/2019 09/17/2019  WBC 4.0 - 10.5 K/uL 6.7 6.3 4.5  RBC 4.22 - 5.81 Mil/uL 3.96(L) 3.43(A) 3.04(L)  HGB 13.0 - 17.0 g/dL 12.0(L) 10.2(A) 9.1(L)  HCT 39.0 - 52.0 % 35.8(L) 32(A) 28.3(L)  PLT 150.0 - 400.0 K/uL 181.0 207 132(L)  NEUTROABS 1.4 - 7.7 K/uL 4.9 4 -  LYMPHSABS 0.7 - 4.0 K/uL 1.0 - -     There is no height or weight on file to calculate BMI.  Orders:  No orders of the defined types were placed in this encounter.  No orders of the defined types were placed in this encounter.    Procedures: No procedures performed  Clinical Data: No additional findings.  ROS:  All other systems negative, except as noted in the HPI. Review of Systems  Objective: Vital Signs: There were no vitals taken for this visit.  Specialty Comments:  No specialty comments available.  PMFS History: Patient Active Problem List  Diagnosis Date Noted  . ICD (implantable cardioverter-defibrillator) battery depletion   . Pressure injury of skin 09/13/2019  . Enterococcal bacteremia 09/13/2019  . AICD (automatic cardioverter/defibrillator) present 09/13/2019  . 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 Urinary retention 09/13/2019  . Phimosis 09/13/2019  . SIRS (systemic inflammatory response syndrome) ([place]) 09/12/2019  . Sepsis ([place]) 09/12/2019  . Enterococcus UTI 09/12/2019  . Cellulitis of foot   . History of transmetatarsal amputation of left foot ([place])   . Subacute osteomyelitis, left ankle and foot ([place])   . 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  ([place]) 04/17/2018  . Diabetic neuropathy, painful ([place]) 12/26/2016  . Neuropathic pain of foot, left 06/20/2016  . 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 Status post amputation of left great toe ([place]) 06/08/2015  . Spinal stenosis of lumbar region 02/16/2015  . Hereditary and idiopathic peripheral neuropathy 01/12/2015  . Oral thrush 12/28/2014  . Postural dizziness 05/24/2014  . Paresthesias with subjective weakness 01/30/2014  . Bilateral thigh pain 01/30/2014  . Abdominal wall pain in right upper quadrant 01/22/2014  . Gross hematuria 09/25/2013  . Intertrigo 09/25/2013  . IBS (irritable bowel syndrome) 09/11/2013  . Chronic pain syndrome 08/06/2013  . CAD (coronary artery disease), native coronary artery 12/25/2012  . Paroxysmal atrial fibrillation ([place]) 12/25/2012  . Moderate aortic stenosis 12/25/2012  . Aortic stenosis 12/24/2012  . Personal history of colonic adenoma 10/30/2012  . Diverticulosis of colon (without mention of hemorrhage) 10/30/2012  . Internal hemorrhoids 10/30/2012  . Change in bowel habits intermittent loose stools 10/30/2012  . Routine health maintenance 06/09/2012  . Hereditary peripheral neuropathy 10/10/2011  . Long term current use of anticoagulant - apixiban 08/16/2010  . Dyspnea on exertion 09/08/2009  . HYPERCHOLESTEROLEMIA-PURE 01/13/2009  . Essential hypertension, benign 01/13/2009  . EDEMA 01/13/2009  . GOUT 01/12/2009  . GLAUCOMA 01/12/2009  . Venous insufficiency of both lower extremities 01/12/2009  . COPD (chronic obstructive pulmonary disease) ([place]) 01/12/2009  . VENTRAL HERNIA 01/12/2009  . OSTEOARTHRITIS 01/12/2009  . ARTHRITIS 01/12/2009  . DEGENERATIVE DISC DISEASE 01/12/2009  . CATARACT, HX OF 01/12/2009  . HEART MURMUR, HX OF 01/12/2009  . BENIGN PROSTATIC HYPERTROPHY, HX OF 01/12/2009  . Obesity 01/17/2007  . OBSTRUCTIVE SLEEP APNEA 01/17/2007  . ASTHMA 01/17/2007  . GERD 01/17/2007  . HALLUX VALGUS, ACQUIRED 01/17/2007  .  PULMONARY HYPERTENSION, HX OF 01/17/2007   Past Medical History:  Diagnosis Date  . AICD (automatic cardioverter/defibrillator) present   . ASTHMA   . Asthma    as a child  . BPH (benign prostatic hypertrophy)   . CAD (coronary artery disease)    Nonobstructive by 12/2012 cath; then 03/2016 he required BMS to RCA ([facility]).  In-stent restenosis on cath 11/01/16, baloon angioplasty successful--pt to take plavix (no ASA), + eliquis (for PAF).  [name] CATARACT, HX OF   . Chronic combined systolic and diastolic CHF (congestive heart failure) ([place]) 05/2016   Ischemic CM.  04/2018 EF 40-45%, grd I DD.  [name] Chronic pain syndrome    Lumbar DDD; chronic neuropathic pain (DM); has spinal stimulator and sees pain mgmt MD  . Complicated UTI (urinary tract infection) 09/2019   Phimoses, acute urinary retention, entoerococcus UTI, enterococc bacteremia.  F/u blood clx's neg x 5d.    [name] COPD   . Diabetes mellitus type 2 with complications (HCC)    BOF7P jumped from 5.7% to 6.1% 03/2015---started metformin at that time.  DM 2 dx by  fasting gluc criteria 2018.  Has chronic neuropath pain  . Elevated transaminase level 02/2016   Suspect due to fatty liver (documented on u/s 2007). Plan repeat labs 03/2016.  [name] Enterococcal bacteremia 09/2019   Phimoses, acute urinary retention, entoerococcus UTI, enterococc bacteremia.  F/u blood clx's neg x 5d.    . Essential hypertension, benign   . Fatty liver 2007   2007 u/s showed fatty liver with hepatosplenomegaly.  2019 repeat u/s->fatty liver but no cirrhosis or hepatosplenomegaly.  [name] GERD (gastroesophageal reflux disease)   . Glaucoma   . GOUT   . Hallux valgus (acquired)   . HH (hiatus hernia)   . HYPERCHOLESTEROLEMIA-PURE   . Hypogonadism male   . Lumbosacral neuritis   . Lumbosacral spondylosis    Lumbar spinal stenosis with neurogenic claudication--contributes to his chronic pain syndrome  . Normal memory function 08/2014   Neuropsychological testing ([place]  [facility]): no cognitive impairment or sign of neurodegenerative disorder.  Likely has adjustment d/o with mixed anxiety/depressed features and may benefit from low dose SNRI.    [name] Normocytic anemia 03/2016   Mild-pt needs ferritin and vit B12 level checked (as of 03/22/16). Hb stable 09/2019  . NSTEMI (non-ST elevated myocardial infarction) ([place]) 03/20/2016   BMS to RCA  . Obesity hypoventilation syndrome ([place])   . Obesity, unspecified   . Orthostatic hypotension   . OSA on CPAP    8 cm H2O  . OSTEOARTHRITIS   . Paroxysmal atrial fibrillation ([place]) 2003    (? chronic?) Off anticoag for a while due to falls.  Then apixaban started 12/2014.  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 Ella Bodo): neuropathic pain->diff to treat, failed nucynta, failed spinal stimul trial, oxycontin hs + tramadol + gabap as of 12/2017 f/u Dr. Letta Pate.  . Personal history of colonic adenoma 10/30/2012   Diminutive adenoma, consider repeat 2019 per GI  . PFO (patent foramen ovale) 09/2019   small, with predominately L to R shunt  . Pneumonia   . Presence of cardiac defibrillator 11/07/2017  . Primary osteoarthritis of both knees    Bone on bone of medial compartments, + signif patellofemoral arth bilat.--supartz inj series started 09/12/17  . PUD (peptic ulcer disease)   . PULMONARY HYPERTENSION, HX OF   . Secondary male hypogonadism 2017  . 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 Shortness of breath    with exertion: much improved s/p TAVR and treatment for CHF.  . Sick sinus syndrome (HCC)    PPM placed  . Thrombocytopenia ([place]) 2018   HSM on 2007 abd u/s---suspect some mild splenic sequestration chronically.  [name] Unspecified glaucoma(365.9)   . Unspecified hereditary and idiopathic peripheral neuropathy approx age 58   bilat LE's, ? left arm, too.  Feet became progressively numb + left foot pain  intermittently.  Pt may be trying a spinal stimulator (as of 05/2015)  . VENOUS INSUFFICIENCY    Being followed by Dr. Sharol Given as of 10/2016 for two R LL venous stasis ulcers/skin tears.  Healed as of 10/30/16 f/u with Dr. Sharol Given.  . VENTRAL HERNIA     Family History  Problem Relation Age of Onset  . Hypertension Mother   . Coronary artery disease Mother   . Heart attack Mother   . Neuropathy Mother   . Pulmonary fibrosis Father        asbestosis  Past Surgical History:  Procedure Laterality Date  . AMPUTATION Left 04/11/2013   Procedure: AMPUTATION DIGIT Left 3rd toe;  Surgeon: [name], MD;  Location: [facility];  Service: Orthopedics;  Laterality: Left;  Left 3rd toe amputation at [facility]  . AMPUTATION Left 08/29/2019   Procedure: LEFT TRANSMETATARSAL AMPUTATION;  Surgeon: [name], MD;  Location: [facility];  Service: Orthopedics;  Laterality: Left;  . CARDIAC CATHETERIZATION  1997; 03/10/16   1997 Non-obstructive disease.  03/2016 BMS to RCA, with 25% pDiag dz, o/w normal cors per cath 03/07/16.  Cath 11/01/16: in stent restenosis, successful baloon angioplasty.  [name] CARDIAC CATHETERIZATION  12/24/2012   mild < 20% LCx, prox 30% RCA; LVEF 55-65% , moderate pulmonary HTN, moderate AS  . CARDIAC DEFIBRILLATOR PLACEMENT  11/07/2017   [facility] MRI Quad CRT defibrillator  . CARDIOVASCULAR STRESS TEST  05/11/16 ([facility])   Myocardial perfusion imaging:  No ischemia; scar in apex, global hypokinesis, EF 36%.  . Carotid dopplers  03/09/2016   [facility]: no hemodynamically significant stenosis on either side.  . CHOLECYSTECTOMY    . COLONOSCOPY    . COLONOSCOPY [name] 10/30/2012   Procedure: COLONOSCOPY;  Surgeon: [name], MD;  Location: [facility];  Service: Endoscopy;  Laterality: [name];  . CORONARY ANGIOPLASTY WITH STENT PLACEMENT  03/2016; 04/2017   2017-[facility]: BMS to RCA-pt was placed on Brilinta.  04/2017: DES to RCA.  [name] EYE SURGERY Bilateral cataract  . HEMORRHOID SURGERY    . INTRAOCULAR LENS  INSERTION Bilateral   . KNEE SURGERY Right   . LEFT AND RIGHT HEART CATHETERIZATION WITH CORONARY ANGIOGRAM [name] 12/24/2012   Procedure: LEFT AND RIGHT HEART CATHETERIZATION WITH CORONARY ANGIOGRAM;  Surgeon: [name], MD;  Location: [facility];  Service: Cardiovascular;  Laterality: [name];  . LEG SURGERY Bilateral    lenghtening   . PACEMAKER PLACEMENT  04/13/2016   2nd deg HB after TAVR, pt had DC MDT PPM placed.  [name] SHOULDER ARTHROSCOPY  08/30/2011   Procedure: ARTHROSCOPY SHOULDER;  Surgeon: [name], MD;  Location: [facility];  Service: Orthopedics;  Laterality: Right;  Right Shoulder Arthroscopy, Debridement, and Decompression  . SPINAL CORD STIMULATOR INSERTION [name] 09/10/2015   Procedure: LUMBAR SPINAL CORD STIMULATOR INSERTION;  Surgeon: [name], MD;  Location: [facility];  Service: Neurosurgery;  Laterality: [name];  . TEE WITHOUT CARDIOVERSION [name] 09/16/2019   Procedure: TRANSESOPHAGEAL ECHOCARDIOGRAM (TEE);  Surgeon: Skeet Latch, MD;  Location: [facility];  Service: Cardiovascular;  Laterality: [name];  . 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  R big toe surg due to osteoarth  . TONSILLECTOMY    . traeculectomy Left    eye  . TRANSCATHETER AORTIC VALVE REPLACEMENT, TRANSFEMORAL  04/11/2016  . TRANSESOPHAGEAL ECHOCARDIOGRAM  03/09/2016; 09/2019   [facility]: EF 55-60%, PFO seen with bi-directional shunting, no thrombus in appendage.  09/2019 ->no valvular vegetations. Small patent foramen ovale with predominantly left to right shunting across the interatrial septum.  . TRANSTHORACIC ECHOCARDIOGRAM  01/2015; 01/2016; 05/18/16; 09/18/16, 05/2017, 08/2017   01/2015 No signif change in aortic stenosis (moderate).  01/2016 Severe LVH w/small LV cavity, EF 60-65%, grade I diast dysfxn.  05/2016 (s/p TAVR): EF 50-55%, grd I DD, biopros AV good.  08/2016--EF 50-55%, LV septal motion c/w conduction abnl, grd I DD,mild MS,bioprosth aortic valve well seated, w/trace AR. 05/2017 TTE EF 35%.  08/2017-EF 35%, mod diff hypokin LV, grd I DD, biopros AV good.   . TRANSTHORACIC ECHOCARDIOGRAM  04/2018; 09/2019   04/2018: EF 40-45%, mod diffuse LV hypokinesis, grd I DD, bioprosth AV well seated, no AS or AR. 09/2019 EF 60-65%, grd I DD, valves fine, including bioprosth AV  . VITRECTOMY     Social History   Occupational History  . Occupation: Engineer, production    Comment: retired  Tobacco Use  . Smoking status: Never Smoker  . Smokeless tobacco: Never Used  Substance and Sexual Activity  . Alcohol use: No    Alcohol/week: 0.0 standard drinks  . Drug use: No    Types: Oxycodone  . Sexual activity: Not Currently

## 2019-10-13 ENCOUNTER — Ambulatory Visit: Payer: Medicare Other | Admitting: Orthopedic Surgery

## 2019-10-13 ENCOUNTER — Other Ambulatory Visit: Payer: Self-pay | Admitting: Urology

## 2019-10-13 ENCOUNTER — Encounter: Payer: Self-pay | Admitting: Cardiology

## 2019-10-13 NOTE — Telephone Encounter (Signed)
Error

## 2019-10-16 ENCOUNTER — Ambulatory Visit: Payer: Medicare Other | Admitting: Orthopedic Surgery

## 2019-10-18 ENCOUNTER — Encounter: Payer: Self-pay | Admitting: Family Medicine

## 2019-10-20 ENCOUNTER — Encounter: Payer: Self-pay | Admitting: Physician Assistant

## 2019-10-20 ENCOUNTER — Other Ambulatory Visit: Payer: Self-pay

## 2019-10-20 ENCOUNTER — Ambulatory Visit (INDEPENDENT_AMBULATORY_CARE_PROVIDER_SITE_OTHER): Payer: Medicare Other | Admitting: Physician Assistant

## 2019-10-20 DIAGNOSIS — M86272 Subacute osteomyelitis, left ankle and foot: Secondary | ICD-10-CM

## 2019-10-20 MED ORDER — DOXYCYCLINE HYCLATE 100 MG PO TABS: 100.0000 mg | ORAL_TABLET | Freq: Two times a day (BID) | ORAL | 0 refills | Status: DC

## 2019-10-20 NOTE — Progress Notes (Signed)
Office Visit Note   Patient: Tim [name]           Date of Birth: [birth year]           MRN: [id number] Visit Date: 10/20/2019              Requested by: Tammi Sou, MD [address] [place],  [place] [address] PCP: Tammi Sou, MD  Chief Complaint  Patient presents with  . Left Foot - Pain, Follow-up      HPI: This is a pleasant gentleman who is 80 weeks status post left transmetatarsal amputation.  He has also been having his leg wrapped.  He unfortunately he could not tolerate the wraps and it was removed.  He denies any fever chills  Assessment & Plan: Visit Diagnoses: No diagnosis found.  Plan: I have called in some doxycycline for him.  He should take these once twice a day he should wash the foot daily and apply a new dry dressing.  Follow-up in a week or so  Follow-Up Instructions: No follow-ups on file.   Ortho Exam  [name], oriented, no adenopathy, well-dressed, normal affect, normal respiratory effort. Left foot: Palpable pulse.  The periphery of the wound is healed well.  In the central portion he does have skin maceration and some surrounding erythema.  There is wound dehiscence.  Mild foul odor.  There is some necrotic tissue as well.  Patient was seen today directly by Dr. Sharol Given  Imaging: No results found. No images are attached to the encounter.  Labs: Lab Results  Component Value Date   HGBA1C 5.6 10/02/2019   HGBA1C 6.1 03/27/2019   HGBA1C 6.1 09/16/2018   ESRSEDRATE 20 02/09/2016   ESRSEDRATE 36 (H) 01/28/2014   LABURIC 3.5 (L) 09/10/2013   REPTSTATUS 09/18/2019 FINAL 09/13/2019   CULT  09/13/2019    NO GROWTH 5 DAYS Performed at [facility], [place] [address], [place], [place] [address]    LABORGA ENTEROCOCCUS FAECALIS 09/11/2019     Lab Results  Component Value Date   ALBUMIN 2.5 (L) 09/16/2019   ALBUMIN 2.6 (L) 09/15/2019   ALBUMIN 2.4 (L) 09/14/2019   LABURIC 3.5 (L) 09/10/2013    Lab Results   Component Value Date   MG 1.8 09/17/2019   No results found for: VD25OH  No results found for: PREALBUMIN CBC EXTENDED Latest Ref Rng & Units 10/02/2019 09/22/2019 09/17/2019  WBC 4.0 - 10.5 K/uL 6.7 6.3 4.5  RBC 4.22 - 5.81 Mil/uL 3.96(L) 3.43(A) 3.04(L)  HGB 13.0 - 17.0 g/dL 12.0(L) 10.2(A) 9.1(L)  HCT 39.0 - 52.0 % 35.8(L) 32(A) 28.3(L)  PLT 150.0 - 400.0 K/uL 181.0 207 132(L)  NEUTROABS 1.4 - 7.7 K/uL 4.9 4 -  LYMPHSABS 0.7 - 4.0 K/uL 1.0 - -     There is no height or weight on file to calculate BMI.  Orders:  No orders of the defined types were placed in this encounter.  Meds ordered this encounter  Medications  . doxycycline (VIBRA-TABS) 100 MG tablet    Sig: Take 1 tablet (100 mg total) by mouth 2 (two) times daily.    Dispense:  60 tablet    Refill:  0     Procedures: No procedures performed  Clinical Data: No additional findings.  ROS:  All other systems negative, except as noted in the HPI. Review of Systems  Objective: Vital Signs: There were no vitals taken for this visit.  Specialty  Comments:  No specialty comments available.  PMFS History: Patient Active Problem List   Diagnosis Date Noted  . ICD (implantable cardioverter-defibrillator) battery depletion   . Pressure injury of skin 09/13/2019  . Enterococcal bacteremia 09/13/2019  . AICD (automatic cardioverter/defibrillator) present 09/13/2019  . 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 Urinary retention 09/13/2019  . Phimosis 09/13/2019  . SIRS (systemic inflammatory response syndrome) ([place]) 09/12/2019  . Sepsis ([place]) 09/12/2019  . Enterococcus UTI 09/12/2019  . Cellulitis of foot   . History of transmetatarsal amputation of left foot ([place])   . Subacute osteomyelitis, left ankle and foot ([place])   . 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 Diabetic neuropathy, painful ([place]) 12/26/2016  . Neuropathic  pain of foot, left 06/20/2016  . 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 Status post amputation of left great toe ([place]) 06/08/2015  . Spinal stenosis of lumbar region 02/16/2015  . Hereditary and idiopathic peripheral neuropathy 01/12/2015  . Oral thrush 12/28/2014  . Postural dizziness 05/24/2014  . Paresthesias with subjective weakness 01/30/2014  . Bilateral thigh pain 01/30/2014  . Abdominal wall pain in right upper quadrant 01/22/2014  . Gross hematuria 09/25/2013  . Intertrigo 09/25/2013  . IBS (irritable bowel syndrome) 09/11/2013  . Chronic pain syndrome 08/06/2013  . CAD (coronary artery disease), native coronary artery 12/25/2012  . Paroxysmal atrial fibrillation ([place]) 12/25/2012  . Moderate aortic stenosis 12/25/2012  . Aortic stenosis 12/24/2012  . Personal history of colonic adenoma 10/30/2012  . Diverticulosis of colon (without mention of hemorrhage) 10/30/2012  . Internal hemorrhoids 10/30/2012  . Change in bowel habits intermittent loose stools 10/30/2012  . Routine health maintenance 06/09/2012  . Hereditary peripheral neuropathy 10/10/2011  . Long term current use of anticoagulant - apixiban 08/16/2010  . Dyspnea on exertion 09/08/2009  . HYPERCHOLESTEROLEMIA-PURE 01/13/2009  . Essential hypertension, benign 01/13/2009  . EDEMA 01/13/2009  . GOUT 01/12/2009  . GLAUCOMA 01/12/2009  . Venous insufficiency of both lower extremities 01/12/2009  . COPD (chronic obstructive pulmonary disease) ([place]) 01/12/2009  . VENTRAL HERNIA 01/12/2009  . OSTEOARTHRITIS 01/12/2009  . ARTHRITIS 01/12/2009  . DEGENERATIVE DISC DISEASE 01/12/2009  . CATARACT, HX OF 01/12/2009  . HEART MURMUR, HX OF 01/12/2009  . BENIGN PROSTATIC HYPERTROPHY, HX OF 01/12/2009  . Obesity 01/17/2007  . OBSTRUCTIVE SLEEP APNEA 01/17/2007  . ASTHMA 01/17/2007  . GERD 01/17/2007  . HALLUX VALGUS, ACQUIRED 01/17/2007  . PULMONARY HYPERTENSION, HX OF 01/17/2007   Past Medical History:  Diagnosis Date  .  AICD (automatic cardioverter/defibrillator) present   . ASTHMA   . Asthma    as a child  . Balanitis    +severe phimosis; elective circ recommended by urol as of 10/2019  . BPH (benign prostatic hypertrophy)   . CAD (coronary artery disease)    Nonobstructive by 12/2012 cath; then 03/2016 he required BMS to RCA ([facility]).  In-stent restenosis on cath 11/01/16, baloon angioplasty successful--pt to take plavix (no ASA), + eliquis (for PAF).  [name] CATARACT, HX OF   . Chronic combined systolic and diastolic CHF (congestive heart failure) ([place]) 05/2016   Ischemic CM.  04/2018 EF 40-45%, grd I DD.  [name] Chronic pain syndrome    Lumbar DDD; chronic neuropathic pain (DM); has spinal stimulator and sees pain mgmt MD  . Complicated UTI (urinary tract infection) 09/2019   Phimoses, acute urinary retention, entoerococcus UTI, enterococc bacteremia.  F/u blood clx's neg x 5d.    [name]  COPD   . Diabetes mellitus type 2 with complications ([place])    XTG6Y jumped from 5.7% to 6.1% 03/2015---started metformin at that time.  DM 2 dx by fasting gluc criteria 2018.  Has chronic neuropath pain  . Elevated transaminase level 02/2016   Suspect due to fatty liver (documented on u/s 2007). Plan repeat labs 03/2016.  [name] Enterococcal bacteremia 09/2019   Phimoses, acute urinary retention, entoerococcus UTI, enterococc bacteremia.  F/u blood clx's neg x 5d.    . Essential hypertension, benign   . Fatty liver 2007   2007 u/s showed fatty liver with hepatosplenomegaly.  2019 repeat u/s->fatty liver but no cirrhosis or hepatosplenomegaly.  [name] GERD (gastroesophageal reflux disease)   . Glaucoma   . GOUT   . Hallux valgus (acquired)   . HH (hiatus hernia)   . HYPERCHOLESTEROLEMIA-PURE   . Hypogonadism male   . Lumbosacral neuritis   . Lumbosacral spondylosis    Lumbar spinal stenosis with neurogenic claudication--contributes to his chronic pain syndrome  . Normal memory function 08/2014   Neuropsychological testing ([place]  [facility]): no cognitive impairment or sign of neurodegenerative disorder.  Likely has adjustment d/o with mixed anxiety/depressed features and may benefit from low dose SNRI.    [name] Normocytic anemia 03/2016   Mild-pt needs ferritin and vit B12 level checked (as of 03/22/16). Hb stable 09/2019  . NSTEMI (non-ST elevated myocardial infarction) ([place]) 03/20/2016   BMS to RCA  . Obesity hypoventilation syndrome ([place])   . Obesity, unspecified   . Orthostatic hypotension   . OSA on CPAP    8 cm H2O  . OSTEOARTHRITIS   . Paroxysmal atrial fibrillation ([place]) 2003    (? chronic?) Off anticoag for a while due to falls.  Then apixaban started 12/2014.  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 Ella Bodo): neuropathic pain->diff to treat, failed nucynta, failed spinal stimul trial, oxycontin hs + tramadol + gabap as of 12/2017 f/u Dr. Letta Pate.  . Personal history of colonic adenoma 10/30/2012   Diminutive adenoma, consider repeat 2019 per GI  . PFO (patent foramen ovale) 09/2019   small, with predominately L to R shunt  . Pneumonia   . Presence of cardiac defibrillator 11/07/2017  . Primary osteoarthritis of both knees    Bone on bone of medial compartments, + signif patellofemoral arth bilat.--supartz inj series started 09/12/17  . PUD (peptic ulcer disease)   . PULMONARY HYPERTENSION, HX OF   . Secondary male hypogonadism 2017  . 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 Shortness of breath    with exertion: much improved s/p TAVR and treatment for CHF.  . Sick sinus syndrome ([facility])    PPM placed  . Thrombocytopenia ([place]) 2018   HSM on 2007 abd u/s---suspect some mild splenic sequestration chronically.  [name] Unspecified glaucoma(365.9)   . Unspecified hereditary and idiopathic peripheral neuropathy approx age 1   bilat LE's, ? left arm, too.  Feet became progressively numb + left foot pain  intermittently.  Pt may be trying a spinal stimulator (as of 05/2015)  . VENOUS INSUFFICIENCY    Being followed by Dr. Sharol Given as of 10/2016 for two R LL venous stasis ulcers/skin tears.  Healed as of 10/30/16 f/u with Dr. Sharol Given.  . VENTRAL HERNIA     Family History  Problem Relation Age of Onset  . Hypertension Mother   . Coronary artery  disease Mother   . Heart attack Mother   . Neuropathy Mother   . Pulmonary fibrosis Father        asbestosis    Past Surgical History:  Procedure Laterality Date  . AMPUTATION Left 04/11/2013   Procedure: AMPUTATION DIGIT Left 3rd toe;  Surgeon: [name], MD;  Location: [facility];  Service: Orthopedics;  Laterality: Left;  Left 3rd toe amputation at [facility]  . AMPUTATION Left 08/29/2019   Procedure: LEFT TRANSMETATARSAL AMPUTATION;  Surgeon: [name], MD;  Location: [facility];  Service: Orthopedics;  Laterality: Left;  . CARDIAC CATHETERIZATION  1997; 03/10/16   1997 Non-obstructive disease.  03/2016 BMS to RCA, with 25% pDiag dz, o/w normal cors per cath 03/07/16.  Cath 11/01/16: in stent restenosis, successful baloon angioplasty.  [name] CARDIAC CATHETERIZATION  12/24/2012   mild < 20% LCx, prox 30% RCA; LVEF 55-65% , moderate pulmonary HTN, moderate AS  . CARDIAC DEFIBRILLATOR PLACEMENT  11/07/2017   [facility] MRI Quad CRT defibrillator  . CARDIOVASCULAR STRESS TEST  05/11/16 ([facility])   Myocardial perfusion imaging:  No ischemia; scar in apex, global hypokinesis, EF 36%.  . Carotid dopplers  03/09/2016   [facility]: no hemodynamically significant stenosis on either side.  . CHOLECYSTECTOMY    . COLONOSCOPY    . COLONOSCOPY [name] 10/30/2012   Procedure: COLONOSCOPY;  Surgeon: [name], MD;  Location: [facility];  Service: Endoscopy;  Laterality: [name];  . CORONARY ANGIOPLASTY WITH STENT PLACEMENT  03/2016; 04/2017   2017-[facility]: BMS to RCA-pt was placed on Brilinta.  04/2017: DES to RCA.  [name] EYE SURGERY Bilateral cataract  . HEMORRHOID SURGERY    . INTRAOCULAR LENS  INSERTION Bilateral   . KNEE SURGERY Right   . LEFT AND RIGHT HEART CATHETERIZATION WITH CORONARY ANGIOGRAM [name] 12/24/2012   Procedure: LEFT AND RIGHT HEART CATHETERIZATION WITH CORONARY ANGIOGRAM;  Surgeon: [name], MD;  Location: [facility];  Service: Cardiovascular;  Laterality: [name];  . LEG SURGERY Bilateral    lenghtening   . PACEMAKER PLACEMENT  04/13/2016   2nd deg HB after TAVR, pt had DC MDT PPM placed.  [name] SHOULDER ARTHROSCOPY  08/30/2011   Procedure: ARTHROSCOPY SHOULDER;  Surgeon: [name], MD;  Location: [facility];  Service: Orthopedics;  Laterality: Right;  Right Shoulder Arthroscopy, Debridement, and Decompression  . SPINAL CORD STIMULATOR INSERTION [name] 09/10/2015   Procedure: LUMBAR SPINAL CORD STIMULATOR INSERTION;  Surgeon: [name], MD;  Location: [facility];  Service: Neurosurgery;  Laterality: [name];  . TEE WITHOUT CARDIOVERSION [name] 09/16/2019   Procedure: TRANSESOPHAGEAL ECHOCARDIOGRAM (TEE);  Surgeon: Skeet Latch, MD;  Location: [facility];  Service: Cardiovascular;  Laterality: [name];  . 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  R big toe surg due to osteoarth  . TONSILLECTOMY    . traeculectomy Left    eye  . TRANSCATHETER AORTIC VALVE REPLACEMENT, TRANSFEMORAL  04/11/2016  . TRANSESOPHAGEAL ECHOCARDIOGRAM  03/09/2016; 09/2019   [facility]: EF 55-60%, PFO seen with bi-directional shunting, no thrombus in appendage.  09/2019 ->no valvular vegetations. Small patent foramen ovale with predominantly left to right shunting across the interatrial septum.  . TRANSTHORACIC ECHOCARDIOGRAM  01/2015; 01/2016; 05/18/16; 09/18/16, 05/2017, 08/2017   01/2015 No signif change in aortic stenosis (moderate).  01/2016 Severe LVH w/small LV cavity, EF 60-65%, grade I diast dysfxn.  05/2016 (s/p TAVR): EF 50-55%, grd I DD, biopros AV good.  08/2016--EF 50-55%, LV septal motion c/w conduction abnl, grd I DD,mild  MS,bioprosth aortic valve well seated, w/trace AR. 05/2017 TTE EF 35%.  08/2017-EF 35%, mod diff hypokin LV, grd I DD, biopros AV good.   . TRANSTHORACIC ECHOCARDIOGRAM  04/2018; 09/2019   04/2018: EF 40-45%, mod diffuse LV hypokinesis, grd I DD, bioprosth AV well seated, no AS or AR. 09/2019 EF 60-65%, grd I DD, valves fine, including bioprosth AV  . VITRECTOMY     Social History   Occupational History  . Occupation: Engineer, production    Comment: retired  Tobacco Use  . Smoking status: Never Smoker  . Smokeless tobacco: Never Used  Substance and Sexual Activity  . Alcohol use: No    Alcohol/week: 0.0 standard drinks  . Drug use: No    Types: Oxycodone  . Sexual activity: Not Currently

## 2019-10-22 ENCOUNTER — Encounter (HOSPITAL_COMMUNITY)
Admission: RE | Admit: 2019-10-22 | Discharge: 2019-10-22 | Disposition: A | Discharge status: Home / Self Care | Payer: Medicare Other | Source: Ambulatory Visit | Attending: Urology | Admitting: Urology

## 2019-10-22 ENCOUNTER — Encounter (HOSPITAL_COMMUNITY): Payer: Self-pay

## 2019-10-22 ENCOUNTER — Ambulatory Visit (INDEPENDENT_AMBULATORY_CARE_PROVIDER_SITE_OTHER): Payer: Medicare Other | Admitting: Infectious Disease

## 2019-10-22 ENCOUNTER — Other Ambulatory Visit: Payer: Self-pay

## 2019-10-22 ENCOUNTER — Encounter: Payer: Self-pay | Admitting: Infectious Disease

## 2019-10-22 VITALS — BP 116/67 | HR 75 | Temp 98.5°F | Wt 322.0 lb

## 2019-10-22 DIAGNOSIS — K219 Gastro-esophageal reflux disease without esophagitis: Secondary | ICD-10-CM | POA: Insufficient documentation

## 2019-10-22 DIAGNOSIS — E1142 Type 2 diabetes mellitus with diabetic polyneuropathy: Secondary | ICD-10-CM | POA: Insufficient documentation

## 2019-10-22 DIAGNOSIS — Z7951 Long term (current) use of inhaled steroids: Secondary | ICD-10-CM | POA: Diagnosis not present

## 2019-10-22 DIAGNOSIS — N471 Phimosis: Secondary | ICD-10-CM | POA: Diagnosis not present

## 2019-10-22 DIAGNOSIS — I110 Hypertensive heart disease with heart failure: Secondary | ICD-10-CM | POA: Insufficient documentation

## 2019-10-22 DIAGNOSIS — I350 Nonrheumatic aortic (valve) stenosis: Secondary | ICD-10-CM | POA: Diagnosis not present

## 2019-10-22 DIAGNOSIS — M86272 Subacute osteomyelitis, left ankle and foot: Secondary | ICD-10-CM

## 2019-10-22 DIAGNOSIS — E662 Morbid (severe) obesity with alveolar hypoventilation: Secondary | ICD-10-CM | POA: Diagnosis not present

## 2019-10-22 DIAGNOSIS — Z6841 Body Mass Index (BMI) 40.0 and over, adult: Secondary | ICD-10-CM | POA: Diagnosis not present

## 2019-10-22 DIAGNOSIS — Z7982 Long term (current) use of aspirin: Secondary | ICD-10-CM | POA: Insufficient documentation

## 2019-10-22 DIAGNOSIS — I509 Heart failure, unspecified: Secondary | ICD-10-CM | POA: Diagnosis not present

## 2019-10-22 DIAGNOSIS — Z95810 Presence of automatic (implantable) cardiac defibrillator: Secondary | ICD-10-CM | POA: Insufficient documentation

## 2019-10-22 DIAGNOSIS — T8130XA Disruption of wound, unspecified, initial encounter: Secondary | ICD-10-CM

## 2019-10-22 DIAGNOSIS — Z7901 Long term (current) use of anticoagulants: Secondary | ICD-10-CM | POA: Diagnosis not present

## 2019-10-22 DIAGNOSIS — Z01818 Encounter for other preprocedural examination: Secondary | ICD-10-CM | POA: Insufficient documentation

## 2019-10-22 DIAGNOSIS — I480 Paroxysmal atrial fibrillation: Secondary | ICD-10-CM | POA: Insufficient documentation

## 2019-10-22 DIAGNOSIS — B952 Enterococcus as the cause of diseases classified elsewhere: Secondary | ICD-10-CM

## 2019-10-22 DIAGNOSIS — N390 Urinary tract infection, site not specified: Secondary | ICD-10-CM | POA: Diagnosis not present

## 2019-10-22 DIAGNOSIS — J449 Chronic obstructive pulmonary disease, unspecified: Secondary | ICD-10-CM | POA: Diagnosis not present

## 2019-10-22 DIAGNOSIS — G894 Chronic pain syndrome: Secondary | ICD-10-CM | POA: Insufficient documentation

## 2019-10-22 DIAGNOSIS — Z79899 Other long term (current) drug therapy: Secondary | ICD-10-CM | POA: Diagnosis not present

## 2019-10-22 DIAGNOSIS — M109 Gout, unspecified: Secondary | ICD-10-CM | POA: Insufficient documentation

## 2019-10-22 DIAGNOSIS — R7881 Bacteremia: Secondary | ICD-10-CM

## 2019-10-22 HISTORY — DX: Disruption of wound, unspecified, initial encounter: T81.30XA

## 2019-10-22 MED ORDER — LINEZOLID 600 MG PO TABS: 600.0000 mg | ORAL_TABLET | Freq: Two times a day (BID) | ORAL | 1 refills | Status: AC

## 2019-10-22 NOTE — Progress Notes (Signed)
Chief complaint is erythema of his amputation site with malaise and fevers to 101 Subjective:    Patient ID: Tim [name], male    DOB: 03-05-1941, 79 y.o.   MRN: [id number]  HPI  a 79 y.o. male with  AMP sensitive enterococcal bacteremia from complicated urinary tract infection who also has an AICD and a TAVR.  Transthoracic echocardiogram and transesophageal echocardiogram were both fortunately without evidence of infection of valves or device.He also recently underwent transmetatarsal amputation by Dr. Sharol Given for severe infection in his left foot.  I saw the patient as an inpatient at [facility].  We gave him a 2-week course of ampicillin IV which he completed several weeks ago.  He has been continue to follow with Dr. Sharol Given for his amputation site which is developed worsening erythema and drainage.  Dr. Sharol Given placed him on doxycycline but despite the doxycycline the patient has had systemic symptoms of malaise and fever to 101 degrees.  Areas also become more erythematous in the interim.    Past Medical History:  Diagnosis Date  . AICD (automatic cardioverter/defibrillator) present   . ASTHMA   . Asthma    as a child  . Balanitis    +severe phimosis; elective circ recommended by urol as of 10/2019  . BPH (benign prostatic hypertrophy)   . CAD (coronary artery disease)    Nonobstructive by 12/2012 cath; then 03/2016 he required BMS to RCA ([facility]).  In-stent restenosis on cath 11/01/16, baloon angioplasty successful--pt to take plavix (no ASA), + eliquis (for PAF).  [name] CATARACT, HX OF   . Chronic combined systolic and diastolic CHF (congestive heart failure) ([place]) 05/2016   Ischemic CM.  04/2018 EF 40-45%, grd I DD.  [name] Chronic pain syndrome    Lumbar DDD; chronic neuropathic pain (DM); has spinal stimulator and sees pain mgmt MD  . Complicated UTI (urinary tract infection) 09/2019   Phimoses, acute urinary retention, entoerococcus UTI, enterococc bacteremia.  F/u blood  clx's neg x 5d.    [name] COPD   . Diabetes mellitus type 2 with complications (HCC)    QQI2L jumped from 5.7% to 6.1% 03/2015---started metformin at that time.  DM 2 dx by fasting gluc criteria 2018.  Has chronic neuropath pain  . Elevated transaminase level 02/2016   Suspect due to fatty liver (documented on u/s 2007). Plan repeat labs 03/2016.  [name] Enterococcal bacteremia 09/2019   Phimoses, acute urinary retention, entoerococcus UTI, enterococc bacteremia.  F/u blood clx's neg x 5d.    . Essential hypertension, benign   . Fatty liver 2007   2007 u/s showed fatty liver with hepatosplenomegaly.  2019 repeat u/s->fatty liver but no cirrhosis or hepatosplenomegaly.  [name] GERD (gastroesophageal reflux disease)   . Glaucoma   . GOUT   . Hallux valgus (acquired)   . HH (hiatus hernia)   . HYPERCHOLESTEROLEMIA-PURE   . Hypogonadism male   . Lumbosacral neuritis   . Lumbosacral spondylosis    Lumbar spinal stenosis with neurogenic claudication--contributes to his chronic pain syndrome  . Normal memory function 08/2014   Neuropsychological testing ([facility]): no cognitive impairment or sign of neurodegenerative disorder.  Likely has adjustment d/o with mixed anxiety/depressed features and may benefit from low dose SNRI.    [name] Normocytic anemia 03/2016   Mild-pt needs ferritin and vit B12 level checked (as of 03/22/16). Hb stable 09/2019  . NSTEMI (non-ST elevated myocardial infarction) ([place]) 03/20/2016   BMS to RCA  . Obesity hypoventilation syndrome ([place])   .  Obesity, unspecified   . Orthostatic hypotension   . OSA on CPAP    8 cm H2O  . OSTEOARTHRITIS   . Paroxysmal atrial fibrillation ([place]) 2003    (? chronic?) Off anticoag for a while due to falls.  Then apixaban started 12/2014.  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 Ella Bodo): neuropathic pain->diff to treat, failed nucynta, failed spinal stimul trial, oxycontin hs + tramadol + gabap as of  12/2017 f/u Dr. Letta Pate.  . Personal history of colonic adenoma 10/30/2012   Diminutive adenoma, consider repeat 2019 per GI  . PFO (patent foramen ovale) 09/2019   small, with predominately L to R shunt  . Pneumonia   . Presence of cardiac defibrillator 11/07/2017  . Primary osteoarthritis of both knees    Bone on bone of medial compartments, + signif patellofemoral arth bilat.--supartz inj series started 09/12/17  . PUD (peptic ulcer disease)   . PULMONARY HYPERTENSION, HX OF   . Secondary male hypogonadism 2017  . 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 Shortness of breath    with exertion: much improved s/p TAVR and treatment for CHF.  . Sick sinus syndrome (HCC)    PPM placed  . Thrombocytopenia ([place]) 2018   HSM on 2007 abd u/s---suspect some mild splenic sequestration chronically.  [name] Unspecified glaucoma(365.9)   . Unspecified hereditary and idiopathic peripheral neuropathy approx age 63   bilat LE's, ? left arm, too.  Feet became progressively numb + left foot pain intermittently.  Pt may be trying a spinal stimulator (as of 05/2015)  . VENOUS INSUFFICIENCY    Being followed by Dr. Sharol Given as of 10/2016 for two R LL venous stasis ulcers/skin tears.  Healed as of 10/30/16 f/u with Dr. Sharol Given.  . VENTRAL HERNIA     Past Surgical History:  Procedure Laterality Date  . AMPUTATION Left 04/11/2013   Procedure: AMPUTATION DIGIT Left 3rd toe;  Surgeon: [name], MD;  Location: [facility];  Service: Orthopedics;  Laterality: Left;  Left 3rd toe amputation at [facility]  . AMPUTATION Left 08/29/2019   Procedure: LEFT TRANSMETATARSAL AMPUTATION;  Surgeon: [name], MD;  Location: [place];  Service: Orthopedics;  Laterality: Left;  . CARDIAC CATHETERIZATION  1997; 03/10/16   1997 Non-obstructive disease.  03/2016 BMS to RCA, with 25% pDiag dz, o/w normal cors per cath 03/07/16.  Cath 11/01/16: in stent restenosis, successful baloon  angioplasty.  [name] CARDIAC CATHETERIZATION  12/24/2012   mild < 20% LCx, prox 30% RCA; LVEF 55-65% , moderate pulmonary HTN, moderate AS  . CARDIAC DEFIBRILLATOR PLACEMENT  11/07/2017   [facility] MRI Quad CRT defibrillator  . CARDIOVASCULAR STRESS TEST  05/11/16 ([facility])   Myocardial perfusion imaging:  No ischemia; scar in apex, global hypokinesis, EF 36%.  . Carotid dopplers  03/09/2016   [facility]: no hemodynamically significant stenosis on either side.  . CHOLECYSTECTOMY    . COLONOSCOPY    . COLONOSCOPY [name] 10/30/2012   Procedure: COLONOSCOPY;  Surgeon: [name], MD;  Location: [facility];  Service: Endoscopy;  Laterality: [name];  . CORONARY ANGIOPLASTY WITH STENT PLACEMENT  03/2016; 04/2017   2017-[facility]: BMS to RCA-pt was placed on Brilinta.  04/2017: DES to RCA.  [name] EYE SURGERY Bilateral cataract  . HEMORRHOID SURGERY    . INTRAOCULAR LENS INSERTION Bilateral   . KNEE SURGERY Right   . LEFT AND RIGHT  HEART CATHETERIZATION WITH CORONARY ANGIOGRAM [name] 12/24/2012   Procedure: LEFT AND RIGHT HEART CATHETERIZATION WITH CORONARY ANGIOGRAM;  Surgeon: [name], MD;  Location: [facility] CATH LAB;  Service: Cardiovascular;  Laterality: [name];  . LEG SURGERY Bilateral    lenghtening   . PACEMAKER PLACEMENT  04/13/2016   2nd deg HB after TAVR, pt had DC MDT PPM placed.  [name] SHOULDER ARTHROSCOPY  08/30/2011   Procedure: ARTHROSCOPY SHOULDER;  Surgeon: [name], MD;  Location: [place];  Service: Orthopedics;  Laterality: Right;  Right Shoulder Arthroscopy, Debridement, and Decompression  . SPINAL CORD STIMULATOR INSERTION [name] 09/10/2015   Procedure: LUMBAR SPINAL CORD STIMULATOR INSERTION;  Surgeon: [name], MD;  Location: [facility] NEURO ORS;  Service: Neurosurgery;  Laterality: [name];  . TEE WITHOUT CARDIOVERSION [name] 09/16/2019   Procedure: TRANSESOPHAGEAL ECHOCARDIOGRAM (TEE);  Surgeon: Skeet Latch, MD;  Location: [place] [facility];  Service: Cardiovascular;  Laterality: [name];  . TOE AMPUTATION Left     due to osteomyelitis.  R big toe surg due to osteoarth  . TONSILLECTOMY    . traeculectomy Left    eye  . TRANSCATHETER AORTIC VALVE REPLACEMENT, TRANSFEMORAL  04/11/2016  . TRANSESOPHAGEAL ECHOCARDIOGRAM  03/09/2016; 09/2019   [facility]: EF 55-60%, PFO seen with bi-directional shunting, no thrombus in appendage.  09/2019 ->no valvular vegetations. Small patent foramen ovale with predominantly left to right shunting across the interatrial septum.  . TRANSTHORACIC ECHOCARDIOGRAM  01/2015; 01/2016; 05/18/16; 09/18/16, 05/2017, 08/2017   01/2015 No signif change in aortic stenosis (moderate).  01/2016 Severe LVH w/small LV cavity, EF 60-65%, grade I diast dysfxn.  05/2016 (s/p TAVR): EF 50-55%, grd I DD, biopros AV good.  08/2016--EF 50-55%, LV septal motion c/w conduction abnl, grd I DD,mild MS,bioprosth aortic valve well seated, w/trace AR. 05/2017 TTE EF 35%. 08/2017-EF 35%, mod diff hypokin LV, grd I DD, biopros AV good.   . TRANSTHORACIC ECHOCARDIOGRAM  04/2018; 09/2019   04/2018: EF 40-45%, mod diffuse LV hypokinesis, grd I DD, bioprosth AV well seated, no AS or AR. 09/2019 EF 60-65%, grd I DD, valves fine, including bioprosth AV  . VITRECTOMY      Family History  Problem Relation Age of Onset  . Hypertension Mother   . Coronary artery disease Mother   . Heart attack Mother   . Neuropathy Mother   . Pulmonary fibrosis Father        asbestosis      Social History   Socioeconomic History  . Marital status: Married    Spouse name: Not on file  . Number of children: 0  . Years of education: 58  . Highest education level: Not on file  Occupational History  . Occupation: Engineer, production    Comment: retired  Tobacco Use  . Smoking status: Never Smoker  . Smokeless tobacco: Never Used  Substance and Sexual Activity  . Alcohol use: No    Alcohol/week: 0.0 standard drinks  . Drug use: No    Types: Oxycodone  . Sexual activity: Not Currently  Other Topics Concern  . Not on file  Social History  Narrative   HSG, [name] - BS, [name] - MS-engineering, 2 years on PhD - [place]. Married - '65 - 48yr/divorced; '76- 3 yrs/divorced; '92 . No children. 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 ACP/Living Will - Yes CPR; long-term Mechanical ventilation as long as he was able to cognate; ok for long term artificial nutrition. Precondition being able  to cognate and not to have too much pain.    Social Determinants of Health   Financial Resource Strain:   . Difficulty of Paying Living Expenses:   Food Insecurity:   . Worried About Charity fundraiser in the Last Year:   . Arboriculturist in the Last Year:   Transportation Needs:   . Film/video editor (Medical):   [name] Lack of Transportation (Non-Medical):   Physical Activity:   . Days of Exercise per Week:   . Minutes of Exercise per Session:   Stress:   . Feeling of Stress :   Social Connections:   . Frequency of Communication with Friends and Family:   . Frequency of Social Gatherings with Friends and Family:   . Attends Religious Services:   . Active Member of Clubs or Organizations:   . Attends Archivist Meetings:   [name] Marital Status:     Allergies  Allergen Reactions  . Brimonidine Tartrate Shortness Of Breath    Alphagan-Shortness of breath  . Brimonidine Tartrate Palpitations  . Brinzolamide Shortness Of Breath    AZOPT- Shortness of breath  . Latanoprost Shortness Of Breath    XALATAN- Shortness of breath  . Nucynta [Tapentadol] Shortness Of Breath  . Sulfa Antibiotics Palpitations  . Timolol Maleate Shortness Of Breath and Other (See Comments)    TIMOPTIC- Aggravated asthma  . Cardizem  [Diltiazem Hcl] Swelling  . Diltiazem Swelling     leg swelling  . Rofecoxib Swelling     VIOXX- leg swelling  . Vancomycin Hives and Other (See Comments)    Possible "Red Man Syndrome"? > hives/blisters  . Codeine Other (See Comments)    Childhood reaction  .  Tamsulosin Other (See Comments)    Dizziness   . Celecoxib Other (See Comments)    CELLBREX-confusion  . Colchicine Diarrhea    diarrhea  . Tape Rash     Current Outpatient Medications:  .  albuterol (PROVENTIL HFA;VENTOLIN HFA) 108 (90 BASE) MCG/ACT inhaler, Inhale 2 puffs into the lungs 4 (four) times daily as needed for wheezing or shortness of breath., Disp: , Rfl:  .  allopurinol (ZYLOPRIM) 300 MG tablet, TAKE 1 TABLET BY MOUTH ONCE DAILY WITH FOOD (Patient taking differently: Take 300 mg by mouth daily. ), Disp: 90 tablet, Rfl: 1 .  apixaban (ELIQUIS) 5 MG TABS tablet, Take 1 tablet (5 mg total) by mouth 2 (two) times daily., Disp: 60 tablet, Rfl: 0 .  ASPIRIN LOW DOSE 81 MG EC tablet, Take 81 mg by mouth daily., Disp: , Rfl:  .  B Complex-C (B-COMPLEX WITH VITAMIN C) tablet, Take 1 tablet by mouth daily. Take 1 tablet daily, 257m, Disp: , Rfl:  .  doxycycline (VIBRA-TABS) 100 MG tablet, Take 1 tablet (100 mg total) by mouth 2 (two) times daily., Disp: 60 tablet, Rfl: 0 .  ezetimibe (ZETIA) 10 MG tablet, take 1 tablet by mouth once daily (Patient taking differently: Take 10 mg by mouth daily. ), Disp: 90 tablet, Rfl: 3 .  finasteride (PROSCAR) 5 MG tablet, TAKE 1 TABLET BY MOUTH EVERY DAY (Patient taking differently: Take 5 mg by mouth daily. ), Disp: 90 tablet, Rfl: 0 .  fluticasone (FLONASE) 50 MCG/ACT nasal spray, Place 2 sprays into both nostrils as needed for allergies or rhinitis., Disp: , Rfl:  .  Fluticasone-Salmeterol (ADVAIR) 250-50 MCG/DOSE AEPB, Inhale 1 puff into the lungs 2 (two) times daily., Disp: , Rfl:  .  gabapentin (NEURONTIN)  800 MG tablet, TAKE 1 TABLET (800 MG TOTAL) BY MOUTH 4 (FOUR) TIMES DAILY., Disp: 360 tablet, Rfl: 2 .  isosorbide mononitrate (IMDUR) 60 MG 24 hr tablet, TAKE 1 TABLET BY MOUTH EVERY DAY (Patient taking differently: Take 30 mg by mouth daily. ), Disp: 90 tablet, Rfl: 3 .  metoprolol succinate (TOPROL-XL) 50 MG 24 hr tablet, Take 50 mg by mouth  daily. Take along with the 4m tablet to make total dose of 757m Disp: , Rfl:  .  Multiple Vitamin (MULTIVITAMIN) capsule, Take 1 capsule by mouth daily., Disp: , Rfl:  .  niacin (NIASPAN) 1000 MG CR tablet, TAKE 1 TABLET BY MOUTH TWICE A DAY (Patient taking differently: Take 1,000 mg by mouth 2 (two) times daily. ), Disp: 180 tablet, Rfl: 1 .  nitroGLYCERIN (NITROSTAT) 0.4 MG SL tablet, Place 0.4 mg under the tongue every 5 (five) minutes as needed for chest pain. , Disp: , Rfl:  .  nystatin (MYCOSTATIN/NYSTOP) powder, Apply 1 application topically 3 (three) times daily. (Patient not taking: Reported on 10/21/2019), Disp: 15 g, Rfl: 0 .  omeprazole (PRILOSEC) 10 MG capsule, TAKE 1 CAPSULE BY MOUTH EVERY DAY, Disp: 90 capsule, Rfl: 0 .  rosuvastatin (CRESTOR) 40 MG tablet, take 1 tablet by mouth once daily (Patient taking differently: Take 40 mg by mouth daily. ), Disp: 15 tablet, Rfl: 0 .  sacubitril-valsartan (ENTRESTO) 24-26 MG, Take 0.5 tablets by mouth 2 (two) times daily. , Disp: , Rfl:  .  torsemide (DEMADEX) 20 MG tablet, Take 20 mg by mouth daily as needed (swelling)., Disp: , Rfl:  .  vitamin C (ASCORBIC ACID) 250 MG tablet, Take 250 mg by mouth daily., Disp: , Rfl:  .  VITAMIN D, CHOLECALCIFEROL, PO, Take 1 tablet by mouth daily. , Disp: , Rfl:    Review of Systems  Constitutional: Negative for activity change, appetite change, chills, diaphoresis, fatigue, fever and unexpected weight change.  HENT: Negative for congestion, rhinorrhea, sinus pressure, sneezing, sore throat and trouble swallowing.   Eyes: Negative for photophobia and visual disturbance.  Respiratory: Negative for cough, chest tightness, shortness of breath, wheezing and stridor.   Cardiovascular: Negative for chest pain, palpitations and leg swelling.  Gastrointestinal: Negative for abdominal distention, abdominal pain, anal bleeding, blood in stool, constipation, diarrhea, nausea and vomiting.  Genitourinary: Negative  for difficulty urinating, dysuria, flank pain and hematuria.  Musculoskeletal: Negative for arthralgias, back pain, gait problem, joint swelling and myalgias.  Skin: Positive for color change and wound. Negative for pallor and rash.  Neurological: Negative for dizziness, tremors, weakness and light-headedness.  Hematological: Negative for adenopathy. Does not bruise/bleed easily.  Psychiatric/Behavioral: Negative for agitation, behavioral problems, confusion, decreased concentration, dysphoric mood and sleep disturbance.       Objective:   Physical Exam Constitutional:      General: He is not in acute distress.    Appearance: Normal appearance. He is well-developed. He is not ill-appearing or diaphoretic.  HENT:     Head: Normocephalic and atraumatic.     Right Ear: Hearing and external ear normal.     Left Ear: Hearing and external ear normal.     Nose: No nasal deformity or rhinorrhea.  Eyes:     General: No scleral icterus.    Conjunctiva/sclera: Conjunctivae normal.     Right eye: Right conjunctiva is not injected.     Left eye: Left conjunctiva is not injected.     Pupils: Pupils are equal, round, and reactive to light.  Neck:     Vascular: No JVD.  Cardiovascular:     Rate and Rhythm: Normal rate and regular rhythm.     Heart sounds: Normal heart sounds, S1 normal and S2 normal. No murmur. No friction rub. No gallop.   Pulmonary:     Effort: Pulmonary effort is normal. No respiratory distress.     Breath sounds: Normal breath sounds. No stridor. No wheezing.  Abdominal:     General: Bowel sounds are normal. There is no distension.     Palpations: Abdomen is soft.  Musculoskeletal:        General: Normal range of motion.     Right shoulder: Normal.     Left shoulder: Normal.     Cervical back: Normal range of motion and neck supple.     Right hip: Normal.     Left hip: Normal.     Right knee: Normal.     Left knee: Normal.  Lymphadenopathy:     Head:     Right side  of head: No submandibular, preauricular or posterior auricular adenopathy.     Left side of head: No submandibular, preauricular or posterior auricular adenopathy.     Cervical: No cervical adenopathy.     Right cervical: No superficial or deep cervical adenopathy.    Left cervical: No superficial or deep cervical adenopathy.  Skin:    General: Skin is warm and dry.     Coloration: Skin is not pale.     Findings: No abrasion, bruising, ecchymosis, erythema, lesion or rash.     Nails: There is no clubbing.  Neurological:     General: No focal deficit present.     Mental Status: He is alert and oriented to person, place, and time.     Sensory: No sensory deficit.     Coordination: Coordination normal.     Gait: Gait normal.  Psychiatric:        Attention and Perception: He is attentive.        Mood and Affect: Mood normal.        Speech: Speech normal.        Behavior: Behavior normal. Behavior is cooperative.        Thought Content: Thought content normal.        Judgment: Judgment normal.     Amputation site while he was in the hospital in March:     Amputation site today October 22, 2019:          Assessment & Plan:  Infection at his amputation site: He is having worsening erythema and systemic symptoms despite doxycycline.  I will get blood cultures and labs today and change him from doxycycline to Zyvox.  Terri Piedra nurse practitioner will see him within the next 10 days and Dr. Sharol Given to see Monday  I would expect he may need further debridement/surgery and also would have low threshold for MRI  Enterococcal bacteremia: We will check blood cultures today  History of ICD and prosthetic valves: Is paramount that he has control of infection to prevent seeding of his device and his prosthetic valves.

## 2019-10-22 NOTE — Patient Instructions (Signed)
DUE TO COVID-19 ONLY ONE VISITOR IS ALLOWED TO COME WITH YOU AND STAY IN THE WAITING ROOM ONLY DURING PRE OP AND PROCEDURE DAY OF SURGERY. THE 1 VISITOR MAY VISIT WITH YOU AFTER SURGERY IN YOUR PRIVATE ROOM DURING VISITING HOURS ONLY!  YOU NEED TO HAVE A COVID 19 TEST ON: 10/25/19 @ 12:15 PM., THIS TEST MUST BE DONE BEFORE SURGERY, COME  [place], [place] , [address].  ([place]) ONCE YOUR COVID TEST IS COMPLETED, PLEASE BEGIN THE QUARANTINE INSTRUCTIONS AS OUTLINED IN YOUR HANDOUT.                Tim [name]    Your procedure is scheduled on: 10/29/19   Report to [facility] Main  Entrance   Report to admitting at: 11:30 AM     Call this number if you have problems the morning of surgery [phone]    Remember: Do not eat SOLID food :After Midnight. CLEAR LIQUIDS FROM MIDNIGHT UNTIL 10:30 AM.    CLEAR LIQUID DIET   Foods Allowed                                                                     Foods Excluded  Coffee and tea, regular and decaf                             liquids that you cannot  Plain Jell-O any favor except red or purple                                           see through such as: Fruit ices (not with fruit pulp)                                     milk, soups, orange juice  Iced Popsicles                                    All solid food Carbonated beverages, regular and diet                                    Cranberry, grape and apple juices Sports drinks like Gatorade Lightly seasoned clear broth or consume(fat free) Sugar, honey syrup  Sample Menu Breakfast                                Lunch                                     Supper Cranberry juice                    Beef broth  Chicken broth Jell-O                                     Grape juice                           Apple juice Coffee or tea                        Jell-O                                      Popsicle                                                 Coffee or tea                        Coffee or tea  _____________________________________________________________________  BRUSH YOUR TEETH MORNING OF SURGERY AND RINSE YOUR MOUTH OUT, NO CHEWING GUM CANDY OR MINTS.     Take these medicines the morning of surgery with A SIP OF WATER:   ALLOPURINOL,DOXYCYCLINE,EZETIMIBE,FINASTERIDE,GABAPENTIN,OMEPRAZOLE,ISOSORBIDE,TORSEMIDE,METOPROLOL,NIACIN.USE INHALERS AND FLONASE AS USUAL.                                 You may not have any metal on your body including hair pins and              piercings  Do not wear jewelry, lotions, powders or perfumes, deodorant             Men may shave face and neck.   Do not bring valuables to the hospital. [place].  Contacts, dentures or bridgework may not be worn into surgery.  Leave suitcase in the car. After surgery it may be brought to your room.     Patients discharged the day of surgery will not be allowed to drive home. IF YOU ARE HAVING SURGERY AND GOING HOME THE SAME DAY, YOU MUST HAVE AN ADULT TO DRIVE YOU HOME AND BE WITH YOU FOR 24 HOURS. YOU MAY GO HOME BY TAXI OR UBER OR ORTHERWISE, BUT AN ADULT MUST ACCOMPANY YOU HOME AND STAY WITH YOU FOR 24 HOURS.  Name and phone number of your driver:  Special Instructions: [name]              Please read over the following fact sheets you were given: _____________________________________________________________________             [facility] - Preparing for Surgery Before surgery, you can play an important role.  Because skin is not sterile, your skin needs to be as free of germs as possible.  You can reduce the number of germs on your skin by washing with CHG (chlorahexidine gluconate) soap before surgery.  CHG is an antiseptic cleaner which kills germs and bonds with the skin to continue killing germs even after washing. Please DO NOT use if you have an allergy to  CHG or antibacterial  soaps.  If your skin becomes reddened/irritated stop using the CHG and inform your nurse when you arrive at Short Stay. Do not shave (including legs and underarms) for at least 48 hours prior to the first CHG shower.  You may shave your face/neck. Please follow these instructions carefully:  1.  Shower with CHG Soap the night before surgery and the  morning of Surgery.  2.  If you choose to wash your hair, wash your hair first as usual with your  normal  shampoo.  3.  After you shampoo, rinse your hair and body thoroughly to remove the  shampoo.                           4.  Use CHG as you would any other liquid soap.  You can apply chg directly  to the skin and wash                       Gently with a scrungie or clean washcloth.  5.  Apply the CHG Soap to your body ONLY FROM THE NECK DOWN.   Do not use on face/ open                           Wound or open sores. Avoid contact with eyes, ears mouth and genitals (private parts).                       Wash face,  Genitals (private parts) with your normal soap.             6.  Wash thoroughly, paying special attention to the area where your surgery  will be performed.  7.  Thoroughly rinse your body with warm water from the neck down.  8.  DO NOT shower/wash with your normal soap after using and rinsing off  the CHG Soap.                9.  Pat yourself dry with a clean towel.            10.  Wear clean pajamas.            11.  Place clean sheets on your bed the night of your first shower and do not  sleep with pets. Day of Surgery : Do not apply any lotions/deodorants the morning of surgery.  Please wear clean clothes to the hospital/surgery center.  FAILURE TO FOLLOW THESE INSTRUCTIONS MAY RESULT IN THE CANCELLATION OF YOUR SURGERY PATIENT SIGNATURE_________________________________  NURSE SIGNATURE__________________________________  ________________________________________________________________________

## 2019-10-22 NOTE — Progress Notes (Signed)
Devised orders fax to Dr.Samuel J Turner.

## 2019-10-22 NOTE — Patient Instructions (Signed)
I would like you to see Terri Piedra NP in next 10 days  Also make appt with me in one month

## 2019-10-22 NOTE — Progress Notes (Signed)
PCP - dR. MCgOWEN. Lov: 10/20/19 Cardiologist -  Geralyn Flash. Lov: 10/07/19  Chest x-ray - 09/12/19 epic EKG - 09/11/19. epixc Stress Test -  ECHO - 09/16/19 epic Cardiac Cath -   Sleep Study - YES CPAP - YES  Fasting Blood Sugar -  Checks Blood Sugar _____ times a day  Blood Thinner Instructions: Aspirin Instructions: Last Dose:  Anesthesia review: Hx: CHF,NSTEMI,AFib,COPD,OSA,DIA. ICD. Pt. Has an incision on left leg post transmetatarsal amputation,as per pt. The incision is draining.  Patient denies shortness of breath, fever, cough and chest pain at PAT appointment   Patient verbalized understanding of instructions that were given to them at the PAT appointment. Patient was also instructed that they will need to review over the PAT instructions again at home before surgery.

## 2019-10-23 ENCOUNTER — Encounter (HOSPITAL_COMMUNITY)
Admission: RE | Admit: 2019-10-23 | Discharge: 2019-10-23 | Disposition: A | Discharge status: Home / Self Care | Payer: Medicare Other | Source: Ambulatory Visit | Attending: Urology | Admitting: Urology

## 2019-10-23 DIAGNOSIS — Z01818 Encounter for other preprocedural examination: Secondary | ICD-10-CM | POA: Diagnosis not present

## 2019-10-23 LAB — CBC
HCT: 34.5 % — ABNORMAL LOW (ref 39.0–52.0)
Hemoglobin: 11 g/dL — ABNORMAL LOW (ref 13.0–17.0)
MCH: 30.1 pg (ref 26.0–34.0)
MCHC: 31.9 g/dL (ref 30.0–36.0)
MCV: 94.5 fL (ref 80.0–100.0)
Platelets: 167 10*3/uL (ref 150–400)
RBC: 3.65 MIL/uL — ABNORMAL LOW (ref 4.22–5.81)
RDW: 14.4 % (ref 11.5–15.5)
WBC: 6.4 10*3/uL (ref 4.0–10.5)
nRBC: 0 % (ref 0.0–0.2)

## 2019-10-23 LAB — GLUCOSE, CAPILLARY: Glucose-Capillary: 151 mg/dL — ABNORMAL HIGH (ref 70–99)

## 2019-10-24 NOTE — Progress Notes (Signed)
Anesthesia Chart Review   Case: [id number] Date/Time: 10/29/19 1315   Procedure: DORSAL SLIT ([name] ) - 27 MINS   Anesthesia type: Choice   Pre-op diagnosis: SEVERE PHIMOSIS   Location: [place] / [facility]   Surgeons: Alexis Frock, MD      DISCUSSION:78 y.o. never smoker with h/o GERD, OSA on CPAP, HTN, COPD, asthma, PAF, s/p TAVR 2017, DM II, CHF, CAD s/p stenting, AICD in place, severe phimosis scheduled for above procedure 10/29/2019 with Dr. Alexis Frock.   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 Infection at amputation site, last seen 10/22/19 by ID.    Recent admission 3/11-3/17/21 due to UTI w/sepsis.  Last seen by PCP 10/02/2019 after recent hospitalization.  Last seen by cardiologist, Dr. Ezequiel Ganser, 10/07/2019.   Anticipate pt can proceed with planned procedure barring acute status change and after evaluation DOS.  VS: Ht 6' (1.829 m)   Wt (!) 146.1 kg   BMI 43.67 kg/m   PROVIDERS: [place], Adrian Blackwater, MD is PCP   Ezequiel Ganser, MD is Cardiologist  LABS: Labs reviewed: Acceptable for surgery. (all labs ordered are listed, but only abnormal results are displayed)  Labs Reviewed - No data to display   IMAGES:   EKG: 09/12/2019 Rate 100 bpm  CV: Echo 09/16/2019 IMPRESSIONS    1. Left ventricular ejection fraction, by estimation, is 60 to 65%. The  left ventricle has normal function. The left ventricle has no regional  wall motion abnormalities.  2. Right ventricular systolic function is normal. The right ventricular  size is normal.  3. No left atrial/left atrial appendage thrombus was detected.  4. The mitral valve is normal in structure. Mild mitral valve  regurgitation. No evidence of mitral stenosis.  5. TAVR gradient mildly elevated. Mean gradient 14 mmHg. This is likely  underestimated due to the angle of detection. TTE data is more accurate.  The aortic valve has been repaired/replaced. Aortic valve regurgitation is  not visualized.  Aortic valve mean  gradient measures 14.0 mmHg. Aortic valve Vmax measures 2.49 m/s.  6. The inferior vena cava is normal in size with greater than 50%  respiratory variability, suggesting right atrial pressure of 3 mmHg.  7. Evidence of atrial level shunting detected by color flow Doppler.  There is a small patent foramen ovale with predominantly left to right  shunting across the atrial septum.  Past Medical History:  Diagnosis Date  . AICD (automatic cardioverter/defibrillator) present   . ASTHMA   . Asthma    as a child  . Balanitis    +severe phimosis; elective circ recommended by urol as of 10/2019  . BPH (benign prostatic hypertrophy)   . CAD (coronary artery disease)    Nonobstructive by 12/2012 cath; then 03/2016 he required BMS to RCA ([facility]).  In-stent restenosis on cath 11/01/16, baloon angioplasty successful--pt to take plavix (no ASA), + eliquis (for PAF).  [name] CATARACT, HX OF   . Chronic combined systolic and diastolic CHF (congestive heart failure) ([place]) 05/2016   Ischemic CM.  04/2018 EF 40-45%, grd I DD.  [name] Chronic pain syndrome    Lumbar DDD; chronic neuropathic pain (DM); has spinal stimulator and sees pain mgmt MD  . Complicated UTI (urinary tract infection) 09/2019   Phimoses, acute urinary retention, entoerococcus UTI, enterococc bacteremia.  F/u blood clx's neg x 5d.    [name] COPD   . Diabetes mellitus type 2 with complications ([place])    EAV4U jumped from 5.7% to 6.1% 03/2015---started  metformin at that time.  DM 2 dx by fasting gluc criteria 2018.  Has chronic neuropath pain  . Elevated transaminase level 02/2016   Suspect due to fatty liver (documented on u/s 2007). Plan repeat labs 03/2016.  [name] Enterococcal bacteremia 09/2019   Phimoses, acute urinary retention, entoerococcus UTI, enterococc bacteremia.  F/u blood clx's neg x 5d.    . Essential hypertension, benign   . Fatty liver 2007   2007 u/s showed fatty liver with hepatosplenomegaly.  2019 repeat u/s->fatty  liver but no cirrhosis or hepatosplenomegaly.  [name] GERD (gastroesophageal reflux disease)   . Glaucoma   . GOUT   . Hallux valgus (acquired)   . HH (hiatus hernia)   . HYPERCHOLESTEROLEMIA-PURE   . Hypogonadism male   . Lumbosacral neuritis   . Lumbosacral spondylosis    Lumbar spinal stenosis with neurogenic claudication--contributes to his chronic pain syndrome  . Normal memory function 08/2014   Neuropsychological testing ([facility]): no cognitive impairment or sign of neurodegenerative disorder.  Likely has adjustment d/o with mixed anxiety/depressed features and may benefit from low dose SNRI.    [name] Normocytic anemia 03/2016   Mild-pt needs ferritin and vit B12 level checked (as of 03/22/16). Hb stable 09/2019  . NSTEMI (non-ST elevated myocardial infarction) ([place]) 03/20/2016   BMS to RCA  . Obesity hypoventilation syndrome ([place])   . Obesity, unspecified   . Orthostatic hypotension   . OSA on CPAP    8 cm H2O  . OSTEOARTHRITIS   . Paroxysmal atrial fibrillation ([place]) 2003    (? chronic?) Off anticoag for a while due to falls.  Then apixaban started 12/2014.  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 Ella Bodo): neuropathic pain->diff to treat, failed nucynta, failed spinal stimul trial, oxycontin hs + tramadol + gabap as of 12/2017 f/u Dr. Letta Pate.  . Personal history of colonic adenoma 10/30/2012   Diminutive adenoma, consider repeat 2019 per GI  . PFO (patent foramen ovale) 09/2019   small, with predominately L to R shunt  . Pneumonia   . Presence of cardiac defibrillator 11/07/2017  . Primary osteoarthritis of both knees    Bone on bone of medial compartments, + signif patellofemoral arth bilat.--supartz inj series started 09/12/17  . PUD (peptic ulcer disease)   . PULMONARY HYPERTENSION, HX OF   . Secondary male hypogonadism 2017  . Severe aortic stenosis    by cath by [facility] 03/2016; CT surgery saw him and did TAVR 04/11/16  ([facility])  . Shortness of breath    with exertion: much improved s/p TAVR and treatment for CHF.  . Sick sinus syndrome ([facility])    PPM placed  . Thrombocytopenia ([place]) 2018   HSM on 2007 abd u/s---suspect some mild splenic sequestration chronically.  [name] Unspecified glaucoma(365.9)   . Unspecified hereditary and idiopathic peripheral neuropathy approx age 1   bilat LE's, ? left arm, too.  Feet became progressively numb + left foot pain intermittently.  Pt may be trying a spinal stimulator (as of 05/2015)  . VENOUS INSUFFICIENCY    Being followed by Dr. Sharol Given as of 10/2016 for two R LL venous stasis ulcers/skin tears.  Healed as of 10/30/16 f/u with Dr. Sharol Given.  . VENTRAL HERNIA   . Wound dehiscence 10/22/2019    Past Surgical History:  Procedure Laterality Date  . AMPUTATION Left 04/11/2013   Procedure: AMPUTATION DIGIT Left 3rd toe;  Surgeon: [name], MD;  Location: [facility];  Service: Orthopedics;  Laterality: Left;  Left 3rd toe amputation at [facility]  . AMPUTATION Left 08/29/2019   Procedure: LEFT TRANSMETATARSAL AMPUTATION;  Surgeon: [name], MD;  Location: [facility];  Service: Orthopedics;  Laterality: Left;  . CARDIAC CATHETERIZATION  1997; 03/10/16   1997 Non-obstructive disease.  03/2016 BMS to RCA, with 25% pDiag dz, o/w normal cors per cath 03/07/16.  Cath 11/01/16: in stent restenosis, successful baloon angioplasty.  [name] CARDIAC CATHETERIZATION  12/24/2012   mild < 20% LCx, prox 30% RCA; LVEF 55-65% , moderate pulmonary HTN, moderate AS  . CARDIAC DEFIBRILLATOR PLACEMENT  11/07/2017   [facility] MRI Quad CRT defibrillator  . CARDIOVASCULAR STRESS TEST  05/11/16 ([facility])   Myocardial perfusion imaging:  No ischemia; scar in apex, global hypokinesis, EF 36%.  . Carotid dopplers  03/09/2016   [facility]: no hemodynamically significant stenosis on either side.  . CHOLECYSTECTOMY    . COLONOSCOPY    . COLONOSCOPY [name] 10/30/2012   Procedure: COLONOSCOPY;  Surgeon: [name], MD;  Location: [facility]  ENDOSCOPY;  Service: Endoscopy;  Laterality: [name];  . CORONARY ANGIOPLASTY WITH STENT PLACEMENT  03/2016; 04/2017   2017-[facility]: BMS to RCA-pt was placed on Brilinta.  04/2017: DES to RCA.  [name] EYE SURGERY Bilateral cataract  . HEMORRHOID SURGERY    . INTRAOCULAR LENS INSERTION Bilateral   . KNEE SURGERY Right   . LEFT AND RIGHT HEART CATHETERIZATION WITH CORONARY ANGIOGRAM [name] 12/24/2012   Procedure: LEFT AND RIGHT HEART CATHETERIZATION WITH CORONARY ANGIOGRAM;  Surgeon: [name], MD;  Location: [facility] CATH LAB;  Service: Cardiovascular;  Laterality: [name];  . LEG SURGERY Bilateral    lenghtening   . PACEMAKER PLACEMENT  04/13/2016   2nd deg HB after TAVR, pt had DC MDT PPM placed.  [name] SHOULDER ARTHROSCOPY  08/30/2011   Procedure: ARTHROSCOPY SHOULDER;  Surgeon: [name], MD;  Location: [place];  Service: Orthopedics;  Laterality: Right;  Right Shoulder Arthroscopy, Debridement, and Decompression  . SPINAL CORD STIMULATOR INSERTION [name] 09/10/2015   Procedure: LUMBAR SPINAL CORD STIMULATOR INSERTION;  Surgeon: [name], MD;  Location: [place] NEURO ORS;  Service: Neurosurgery;  Laterality: [name];  . TEE WITHOUT CARDIOVERSION [name] 09/16/2019   Procedure: TRANSESOPHAGEAL ECHOCARDIOGRAM (TEE);  Surgeon: Skeet Latch, MD;  Location: [facility];  Service: Cardiovascular;  Laterality: [name];  . 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  R big toe surg due to osteoarth  . TONSILLECTOMY    . traeculectomy Left    eye  . TRANSCATHETER AORTIC VALVE REPLACEMENT, TRANSFEMORAL  04/11/2016  . TRANSESOPHAGEAL ECHOCARDIOGRAM  03/09/2016; 09/2019   [facility]: EF 55-60%, PFO seen with bi-directional shunting, no thrombus in appendage.  09/2019 ->no valvular vegetations. Small patent foramen ovale with predominantly left to right shunting across the interatrial septum.  . TRANSTHORACIC ECHOCARDIOGRAM  01/2015; 01/2016; 05/18/16; 09/18/16, 05/2017, 08/2017   01/2015 No signif change in aortic stenosis (moderate).   01/2016 Severe LVH w/small LV cavity, EF 60-65%, grade I diast dysfxn.  05/2016 (s/p TAVR): EF 50-55%, grd I DD, biopros AV good.  08/2016--EF 50-55%, LV septal motion c/w conduction abnl, grd I DD,mild MS,bioprosth aortic valve well seated, w/trace AR. 05/2017 TTE EF 35%. 08/2017-EF 35%, mod diff hypokin LV, grd I DD, biopros AV good.   . TRANSTHORACIC ECHOCARDIOGRAM  04/2018; 09/2019   04/2018: EF 40-45%, mod diffuse LV hypokinesis, grd I DD, bioprosth AV well seated, no AS or AR. 09/2019 EF 60-65%, grd I DD, valves fine, including bioprosth AV  .  VITRECTOMY      MEDICATIONS: . albuterol (PROVENTIL HFA;VENTOLIN HFA) 108 (90 BASE) MCG/ACT inhaler  . allopurinol (ZYLOPRIM) 300 MG tablet  . apixaban (ELIQUIS) 5 MG TABS tablet  . ASPIRIN LOW DOSE 81 MG EC tablet  . B Complex-C (B-COMPLEX WITH VITAMIN C) tablet  . doxycycline (VIBRA-TABS) 100 MG tablet  . ezetimibe (ZETIA) 10 MG tablet  . finasteride (PROSCAR) 5 MG tablet  . fluticasone (FLONASE) 50 MCG/ACT nasal spray  . Fluticasone-Salmeterol (ADVAIR) 250-50 MCG/DOSE AEPB  . gabapentin (NEURONTIN) 800 MG tablet  . isosorbide mononitrate (IMDUR) 60 MG 24 hr tablet  . linezolid (ZYVOX) 600 MG tablet  . metoprolol succinate (TOPROL-XL) 50 MG 24 hr tablet  . Multiple Vitamin (MULTIVITAMIN) capsule  . niacin (NIASPAN) 1000 MG CR tablet  . nitroGLYCERIN (NITROSTAT) 0.4 MG SL tablet  . nystatin (MYCOSTATIN/NYSTOP) powder  . omeprazole (PRILOSEC) 10 MG capsule  . rosuvastatin (CRESTOR) 40 MG tablet  . sacubitril-valsartan (ENTRESTO) 24-26 MG  . torsemide (DEMADEX) 20 MG tablet  . vitamin C (ASCORBIC ACID) 250 MG tablet  . VITAMIN D, CHOLECALCIFEROL, PO   No current facility-administered medications for this encounter.    Maia Plan [facility] Pre-Surgical Testing [phone] 10/24/19  1:36 PM

## 2019-10-25 ENCOUNTER — Other Ambulatory Visit (HOSPITAL_COMMUNITY)
Admission: RE | Admit: 2019-10-25 | Discharge: 2019-10-25 | Disposition: A | Discharge status: Home / Self Care | Payer: Medicare Other | Source: Ambulatory Visit | Attending: Urology | Admitting: Urology

## 2019-10-25 DIAGNOSIS — Z20822 Contact with and (suspected) exposure to covid-19: Secondary | ICD-10-CM | POA: Insufficient documentation

## 2019-10-25 DIAGNOSIS — Z01812 Encounter for preprocedural laboratory examination: Secondary | ICD-10-CM | POA: Diagnosis present

## 2019-10-25 LAB — SARS CORONAVIRUS 2 (TAT 6-24 HRS): SARS Coronavirus 2: NEGATIVE

## 2019-10-27 ENCOUNTER — Encounter: Payer: Self-pay | Admitting: Orthopedic Surgery

## 2019-10-27 ENCOUNTER — Other Ambulatory Visit: Payer: Self-pay

## 2019-10-27 ENCOUNTER — Ambulatory Visit (INDEPENDENT_AMBULATORY_CARE_PROVIDER_SITE_OTHER): Payer: Medicare Other | Admitting: Orthopedic Surgery

## 2019-10-27 VITALS — Ht 72.0 in | Wt 318.0 lb

## 2019-10-27 DIAGNOSIS — Z89432 Acquired absence of left foot: Secondary | ICD-10-CM

## 2019-10-27 DIAGNOSIS — T8781 Dehiscence of amputation stump: Secondary | ICD-10-CM

## 2019-10-27 NOTE — Progress Notes (Signed)
Office Visit Note   Patient: Tim [name]           Date of Birth: Apr 08, 1941           MRN: [id number] Visit Date: 10/27/2019              Requested by: Tammi Sou, MD [address] [place],  [place] [address] PCP: Tammi Sou, MD  Chief Complaint  [name]    Left transmetatarsal amputation DOS 08/29/2019      HPI: Patient is a 79 year old gentleman who was seen in follow-up 2 months status post left transmetatarsal amputation patient states that the foot is healing quite well he states he still on his antibiotics.  Assessment & Plan: Visit Diagnoses:  1. History of transmetatarsal amputation of left foot ([place])   2. Dehiscence of amputation stump (HCC)     Plan: I reviewed the patient's cultures from last month which were all negative for blood cultures.  [place] sutures today patient will start Iodosorb dressing changes with dry dressing daily.  Follow-Up Instructions: Return in about 4 weeks (around 11/24/2019).   Ortho Exam  [name], oriented, no adenopathy, well-dressed, normal affect, normal respiratory effort. Examination patient has shown good interval healing of the left transmetatarsal amputation the cellulitis has resolved the swelling is resolving.  The incision is well-healed except for 1 area with granulation tissue about 1 cm diameter 1 mm deep no signs or symptoms of deep infection.  We will harvest the sutures today start Iodosorb dressing changes.  Imaging: No results found. No images are attached to the encounter.  Labs: Lab Results  Component Value Date   HGBA1C 5.6 10/02/2019   HGBA1C 6.1 03/27/2019   HGBA1C 6.1 09/16/2018   ESRSEDRATE 20 02/09/2016   ESRSEDRATE 36 (H) 01/28/2014   LABURIC 3.5 (L) 09/10/2013   REPTSTATUS 09/18/2019 FINAL 09/13/2019   CULT  09/13/2019    NO GROWTH 5 DAYS Performed at [facility], [place] [address] [place], [place] [address]    LABORGA  ENTEROCOCCUS FAECALIS 09/11/2019     Lab Results  Component Value Date   ALBUMIN 2.5 (L) 09/16/2019   ALBUMIN 2.6 (L) 09/15/2019   ALBUMIN 2.4 (L) 09/14/2019   LABURIC 3.5 (L) 09/10/2013    Lab Results  Component Value Date   MG 1.8 09/17/2019   No results found for: VD25OH  No results found for: PREALBUMIN CBC EXTENDED Latest Ref Rng & Units 10/23/2019 10/02/2019 09/22/2019  WBC 4.0 - 10.5 K/uL 6.4 6.7 6.3  RBC 4.22 - 5.81 MIL/uL 3.65(L) 3.96(L) 3.43(A)  HGB 13.0 - 17.0 g/dL 11.0(L) 12.0(L) 10.2(A)  HCT 39.0 - 52.0 % 34.5(L) 35.8(L) 32(A)  PLT 150 - 400 K/uL 167 181.0 207  NEUTROABS 1.4 - 7.7 K/uL - 4.9 4  LYMPHSABS 0.7 - 4.0 K/uL - 1.0 -     Body mass index is 43.13 kg/m.  Orders:  No orders of the defined types were placed in this encounter.  No orders of the defined types were placed in this encounter.    Procedures: No procedures performed  Clinical Data: No additional findings.  ROS:  All other systems negative, except as noted in the HPI. Review of Systems  Objective: Vital Signs: Ht 6' (1.829 m)   Wt (!) 318 lb (144.2 kg)   BMI 43.13 kg/m   Specialty Comments:  No specialty comments available.  PMFS History: Patient Active  Problem List   Diagnosis Date Noted  . Wound dehiscence 10/22/2019  . ICD (implantable cardioverter-defibrillator) battery depletion   . Pressure injury of skin 09/13/2019  . Enterococcal bacteremia 09/13/2019  . 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  . 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 Urinary retention 09/13/2019  . Phimosis 09/13/2019  . SIRS (systemic inflammatory response syndrome) ([place]) 09/12/2019  . Sepsis ([place]) 09/12/2019  . Enterococcus UTI 09/12/2019  . Cellulitis of foot   . History of transmetatarsal amputation of left foot ([place])   . Subacute osteomyelitis, left ankle and foot ([place])   . 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 Diabetic neuropathy, painful ([place]) 12/26/2016  . Neuropathic pain of foot, left 06/20/2016  . 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 Status post amputation of left great toe ([place]) 06/08/2015  . Spinal stenosis of lumbar region 02/16/2015  . Hereditary and idiopathic peripheral neuropathy 01/12/2015  . Oral thrush 12/28/2014  . Postural dizziness 05/24/2014  . Paresthesias with subjective weakness 01/30/2014  . Bilateral thigh pain 01/30/2014  . Abdominal wall pain in right upper quadrant 01/22/2014  . Gross hematuria 09/25/2013  . Intertrigo 09/25/2013  . IBS (irritable bowel syndrome) 09/11/2013  . Chronic pain syndrome 08/06/2013  . CAD (coronary artery disease), native coronary artery 12/25/2012  . Paroxysmal atrial fibrillation ([place]) 12/25/2012  . Moderate aortic stenosis 12/25/2012  . Aortic stenosis 12/24/2012  . Personal history of colonic adenoma 10/30/2012  . Diverticulosis of colon (without mention of hemorrhage) 10/30/2012  . Internal hemorrhoids 10/30/2012  . Change in bowel habits intermittent loose stools 10/30/2012  . Routine health maintenance 06/09/2012  . Hereditary peripheral neuropathy 10/10/2011  . Long term current use of anticoagulant - apixiban 08/16/2010  . Dyspnea on exertion 09/08/2009  . HYPERCHOLESTEROLEMIA-PURE 01/13/2009  . Essential hypertension, benign 01/13/2009  . EDEMA 01/13/2009  . GOUT 01/12/2009  . GLAUCOMA 01/12/2009  . Venous insufficiency of both lower extremities 01/12/2009  . COPD (chronic obstructive pulmonary disease) ([place]) 01/12/2009  . VENTRAL HERNIA 01/12/2009  . OSTEOARTHRITIS 01/12/2009  . ARTHRITIS 01/12/2009  . DEGENERATIVE DISC DISEASE 01/12/2009  . CATARACT, HX OF 01/12/2009  . HEART MURMUR, HX OF 01/12/2009  . BENIGN PROSTATIC HYPERTROPHY, HX OF 01/12/2009  . Obesity 01/17/2007  . OBSTRUCTIVE SLEEP APNEA 01/17/2007  . ASTHMA 01/17/2007  . GERD 01/17/2007  . HALLUX VALGUS,  ACQUIRED 01/17/2007  . PULMONARY HYPERTENSION, HX OF 01/17/2007   Past Medical History:  Diagnosis Date  . AICD (automatic cardioverter/defibrillator) present   . ASTHMA   . Asthma    as a child  . Balanitis    +severe phimosis; elective circ recommended by urol as of 10/2019  . BPH (benign prostatic hypertrophy)   . CAD (coronary artery disease)    Nonobstructive by 12/2012 cath; then 03/2016 he required BMS to RCA ([facility]).  In-stent restenosis on cath 11/01/16, baloon angioplasty successful--pt to take plavix (no ASA), + eliquis (for PAF).  [name] CATARACT, HX OF   . Chronic combined systolic and diastolic CHF (congestive heart failure) ([place]) 05/2016   Ischemic CM.  04/2018 EF 40-45%, grd I DD.  [name] Chronic pain syndrome    Lumbar DDD; chronic neuropathic pain (DM); has spinal stimulator and sees pain mgmt MD  . Complicated UTI (urinary tract infection) 09/2019   Phimoses, acute urinary retention, entoerococcus UTI, enterococc bacteremia.  F/u blood clx's neg x 5d.    [name] COPD   .  Diabetes mellitus type 2 with complications (HCC)    ZTI4P jumped from 5.7% to 6.1% 03/2015---started metformin at that time.  DM 2 dx by fasting gluc criteria 2018.  Has chronic neuropath pain  . Elevated transaminase level 02/2016   Suspect due to fatty liver (documented on u/s 2007). Plan repeat labs 03/2016.  [name] Enterococcal bacteremia 09/2019   Phimoses, acute urinary retention, entoerococcus UTI, enterococc bacteremia.  F/u blood clx's neg x 5d.    . Essential hypertension, benign   . Fatty liver 2007   2007 u/s showed fatty liver with hepatosplenomegaly.  2019 repeat u/s->fatty liver but no cirrhosis or hepatosplenomegaly.  [name] GERD (gastroesophageal reflux disease)   . Glaucoma   . GOUT   . Hallux valgus (acquired)   . HH (hiatus hernia)   . HYPERCHOLESTEROLEMIA-PURE   . Hypogonadism male   . Lumbosacral neuritis   . Lumbosacral spondylosis    Lumbar spinal stenosis with neurogenic claudication--contributes  to his chronic pain syndrome  . Normal memory function 08/2014   Neuropsychological testing ([facility]): no cognitive impairment or sign of neurodegenerative disorder.  Likely has adjustment d/o with mixed anxiety/depressed features and may benefit from low dose SNRI.    [name] Normocytic anemia 03/2016   Mild-pt needs ferritin and vit B12 level checked (as of 03/22/16). Hb stable 09/2019  . NSTEMI (non-ST elevated myocardial infarction) ([place]) 03/20/2016   BMS to RCA  . Obesity hypoventilation syndrome ([place])   . Obesity, unspecified   . Orthostatic hypotension   . OSA on CPAP    8 cm H2O  . OSTEOARTHRITIS   . Paroxysmal atrial fibrillation ([place]) 2003    (? chronic?) Off anticoag for a while due to falls.  Then apixaban started 12/2014.  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 Ella Bodo): neuropathic pain->diff to treat, failed nucynta, failed spinal stimul trial, oxycontin hs + tramadol + gabap as of 12/2017 f/u Dr. Letta Pate.  . Personal history of colonic adenoma 10/30/2012   Diminutive adenoma, consider repeat 2019 per GI  . PFO (patent foramen ovale) 09/2019   small, with predominately L to R shunt  . Pneumonia   . Presence of cardiac defibrillator 11/07/2017  . Primary osteoarthritis of both knees    Bone on bone of medial compartments, + signif patellofemoral arth bilat.--supartz inj series started 09/12/17  . PUD (peptic ulcer disease)   . PULMONARY HYPERTENSION, HX OF   . Secondary male hypogonadism 2017  . 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 Shortness of breath    with exertion: much improved s/p TAVR and treatment for CHF.  . Sick sinus syndrome (HCC)    PPM placed  . Thrombocytopenia ([place]) 2018   HSM on 2007 abd u/s---suspect some mild splenic sequestration chronically.  [name] Unspecified glaucoma(365.9)   . Unspecified hereditary and idiopathic peripheral  neuropathy approx age 12   bilat LE's, ? left arm, too.  Feet became progressively numb + left foot pain intermittently.  Pt may be trying a spinal stimulator (as of 05/2015)  . VENOUS INSUFFICIENCY    Being followed by Dr. Sharol Given as of 10/2016 for two R LL venous stasis ulcers/skin tears.  Healed as of 10/30/16 f/u with Dr. Sharol Given.  . VENTRAL HERNIA   . Wound dehiscence 10/22/2019    Family History  Problem Relation Age of Onset  . Hypertension Mother   . Coronary  artery disease Mother   . Heart attack Mother   . Neuropathy Mother   . Pulmonary fibrosis Father        asbestosis    Past Surgical History:  Procedure Laterality Date  . AMPUTATION Left 04/11/2013   Procedure: AMPUTATION DIGIT Left 3rd toe;  Surgeon: [name], MD;  Location: [place] [facility];  Service: Orthopedics;  Laterality: Left;  Left 3rd toe amputation at [facility]  . AMPUTATION Left 08/29/2019   Procedure: LEFT TRANSMETATARSAL AMPUTATION;  Surgeon: [name], MD;  Location: [place] [place];  Service: Orthopedics;  Laterality: Left;  . CARDIAC CATHETERIZATION  1997; 03/10/16   1997 Non-obstructive disease.  03/2016 BMS to RCA, with 25% pDiag dz, o/w normal cors per cath 03/07/16.  Cath 11/01/16: in stent restenosis, successful baloon angioplasty.  [name] CARDIAC CATHETERIZATION  12/24/2012   mild < 20% LCx, prox 30% RCA; LVEF 55-65% , moderate pulmonary HTN, moderate AS  . CARDIAC DEFIBRILLATOR PLACEMENT  11/07/2017   [facility] MRI Quad CRT defibrillator  . CARDIOVASCULAR STRESS TEST  05/11/16 ([facility])   Myocardial perfusion imaging:  No ischemia; scar in apex, global hypokinesis, EF 36%.  . Carotid dopplers  03/09/2016   [facility]: no hemodynamically significant stenosis on either side.  . CHOLECYSTECTOMY    . COLONOSCOPY    . COLONOSCOPY [name] 10/30/2012   Procedure: COLONOSCOPY;  Surgeon: [name], MD;  Location: [facility];  Service: Endoscopy;  Laterality: [name];  . CORONARY ANGIOPLASTY WITH STENT PLACEMENT  03/2016; 04/2017   2017-[facility]: BMS to  RCA-pt was placed on Brilinta.  04/2017: DES to RCA.  [name] EYE SURGERY Bilateral cataract  . HEMORRHOID SURGERY    . INTRAOCULAR LENS INSERTION Bilateral   . KNEE SURGERY Right   . LEFT AND RIGHT HEART CATHETERIZATION WITH CORONARY ANGIOGRAM [name] 12/24/2012   Procedure: LEFT AND RIGHT HEART CATHETERIZATION WITH CORONARY ANGIOGRAM;  Surgeon: [name], MD;  Location: [facility];  Service: Cardiovascular;  Laterality: [name];  . LEG SURGERY Bilateral    lenghtening   . PACEMAKER PLACEMENT  04/13/2016   2nd deg HB after TAVR, pt had DC MDT PPM placed.  [name] SHOULDER ARTHROSCOPY  08/30/2011   Procedure: ARTHROSCOPY SHOULDER;  Surgeon: [name], MD;  Location: [facility];  Service: Orthopedics;  Laterality: Right;  Right Shoulder Arthroscopy, Debridement, and Decompression  . SPINAL CORD STIMULATOR INSERTION [name] 09/10/2015   Procedure: LUMBAR SPINAL CORD STIMULATOR INSERTION;  Surgeon: [name], MD;  Location: [facility];  Service: Neurosurgery;  Laterality: [name];  . TEE WITHOUT CARDIOVERSION [name] 09/16/2019   Procedure: TRANSESOPHAGEAL ECHOCARDIOGRAM (TEE);  Surgeon: Skeet Latch, MD;  Location: [facility] [facility];  Service: Cardiovascular;  Laterality: [name];  . 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  R big toe surg due to osteoarth  . TONSILLECTOMY    . traeculectomy Left    eye  . TRANSCATHETER AORTIC VALVE REPLACEMENT, TRANSFEMORAL  04/11/2016  . TRANSESOPHAGEAL ECHOCARDIOGRAM  03/09/2016; 09/2019   [facility]: EF 55-60%, PFO seen with bi-directional shunting, no thrombus in appendage.  09/2019 ->no valvular vegetations. Small patent foramen ovale with predominantly left to right shunting across the interatrial septum.  . TRANSTHORACIC ECHOCARDIOGRAM  01/2015; 01/2016; 05/18/16; 09/18/16, 05/2017, 08/2017   01/2015 No signif change in aortic stenosis (moderate).  01/2016 Severe LVH w/small LV cavity, EF 60-65%, grade I diast dysfxn.  05/2016 (s/p TAVR): EF 50-55%, grd I DD, biopros AV good.  08/2016--EF  50-55%, LV septal motion c/w conduction abnl, grd  I DD,mild MS,bioprosth aortic valve well seated, w/trace AR. 05/2017 TTE EF 35%. 08/2017-EF 35%, mod diff hypokin LV, grd I DD, biopros AV good.   . TRANSTHORACIC ECHOCARDIOGRAM  04/2018; 09/2019   04/2018: EF 40-45%, mod diffuse LV hypokinesis, grd I DD, bioprosth AV well seated, no AS or AR. 09/2019 EF 60-65%, grd I DD, valves fine, including bioprosth AV  . VITRECTOMY     Social History   Occupational History  . Occupation: Engineer, production    Comment: retired  Tobacco Use  . Smoking status: Never Smoker  . Smokeless tobacco: Never Used  Substance and Sexual Activity  . Alcohol use: No    Alcohol/week: 0.0 standard drinks  . Drug use: No    Types: Oxycodone  . Sexual activity: Not Currently

## 2019-10-29 ENCOUNTER — Encounter (HOSPITAL_COMMUNITY): Payer: Self-pay | Admitting: Urology

## 2019-10-29 ENCOUNTER — Ambulatory Visit (HOSPITAL_COMMUNITY): Payer: Medicare Other | Admitting: Physician Assistant

## 2019-10-29 ENCOUNTER — Ambulatory Visit (HOSPITAL_COMMUNITY)
Admission: RE | Admit: 2019-10-29 | Discharge: 2019-10-29 | Disposition: A | Discharge status: Home / Self Care | Payer: Medicare Other | Attending: Urology | Admitting: Urology

## 2019-10-29 ENCOUNTER — Encounter (HOSPITAL_COMMUNITY)
Admission: RE | Disposition: A | Discharge status: Home / Self Care | Payer: Self-pay | Source: Home / Self Care | Attending: Urology

## 2019-10-29 ENCOUNTER — Other Ambulatory Visit: Payer: Self-pay

## 2019-10-29 ENCOUNTER — Ambulatory Visit (HOSPITAL_COMMUNITY): Payer: Medicare Other | Admitting: Certified Registered Nurse Anesthetist

## 2019-10-29 DIAGNOSIS — Z79899 Other long term (current) drug therapy: Secondary | ICD-10-CM | POA: Insufficient documentation

## 2019-10-29 DIAGNOSIS — N401 Enlarged prostate with lower urinary tract symptoms: Secondary | ICD-10-CM | POA: Diagnosis not present

## 2019-10-29 DIAGNOSIS — Z89432 Acquired absence of left foot: Secondary | ICD-10-CM | POA: Insufficient documentation

## 2019-10-29 DIAGNOSIS — Z7901 Long term (current) use of anticoagulants: Secondary | ICD-10-CM | POA: Insufficient documentation

## 2019-10-29 DIAGNOSIS — N471 Phimosis: Secondary | ICD-10-CM | POA: Diagnosis present

## 2019-10-29 DIAGNOSIS — I255 Ischemic cardiomyopathy: Secondary | ICD-10-CM | POA: Diagnosis not present

## 2019-10-29 DIAGNOSIS — I480 Paroxysmal atrial fibrillation: Secondary | ICD-10-CM | POA: Diagnosis not present

## 2019-10-29 DIAGNOSIS — I110 Hypertensive heart disease with heart failure: Secondary | ICD-10-CM | POA: Diagnosis not present

## 2019-10-29 DIAGNOSIS — R338 Other retention of urine: Secondary | ICD-10-CM | POA: Diagnosis not present

## 2019-10-29 DIAGNOSIS — I252 Old myocardial infarction: Secondary | ICD-10-CM | POA: Insufficient documentation

## 2019-10-29 DIAGNOSIS — I5042 Chronic combined systolic (congestive) and diastolic (congestive) heart failure: Secondary | ICD-10-CM | POA: Insufficient documentation

## 2019-10-29 DIAGNOSIS — R7303 Prediabetes: Secondary | ICD-10-CM | POA: Insufficient documentation

## 2019-10-29 DIAGNOSIS — Z9682 Presence of neurostimulator: Secondary | ICD-10-CM | POA: Diagnosis not present

## 2019-10-29 DIAGNOSIS — Q5564 Hidden penis: Secondary | ICD-10-CM | POA: Insufficient documentation

## 2019-10-29 DIAGNOSIS — Z89411 Acquired absence of right great toe: Secondary | ICD-10-CM | POA: Diagnosis not present

## 2019-10-29 DIAGNOSIS — J449 Chronic obstructive pulmonary disease, unspecified: Secondary | ICD-10-CM | POA: Diagnosis not present

## 2019-10-29 DIAGNOSIS — Z955 Presence of coronary angioplasty implant and graft: Secondary | ICD-10-CM | POA: Insufficient documentation

## 2019-10-29 DIAGNOSIS — Z95810 Presence of automatic (implantable) cardiac defibrillator: Secondary | ICD-10-CM | POA: Insufficient documentation

## 2019-10-29 DIAGNOSIS — Z6841 Body Mass Index (BMI) 40.0 and over, adult: Secondary | ICD-10-CM | POA: Insufficient documentation

## 2019-10-29 DIAGNOSIS — I2510 Atherosclerotic heart disease of native coronary artery without angina pectoris: Secondary | ICD-10-CM | POA: Insufficient documentation

## 2019-10-29 DIAGNOSIS — K219 Gastro-esophageal reflux disease without esophagitis: Secondary | ICD-10-CM | POA: Insufficient documentation

## 2019-10-29 DIAGNOSIS — G4733 Obstructive sleep apnea (adult) (pediatric): Secondary | ICD-10-CM | POA: Diagnosis not present

## 2019-10-29 DIAGNOSIS — E662 Morbid (severe) obesity with alveolar hypoventilation: Secondary | ICD-10-CM | POA: Insufficient documentation

## 2019-10-29 DIAGNOSIS — G894 Chronic pain syndrome: Secondary | ICD-10-CM | POA: Diagnosis not present

## 2019-10-29 HISTORY — PX: DORSAL SLIT: SHX6822

## 2019-10-29 LAB — GLUCOSE, CAPILLARY: Glucose-Capillary: 157 mg/dL — ABNORMAL HIGH (ref 70–99)

## 2019-10-29 SURGERY — DORSAL SLIT, PREPUCE
Anesthesia: General

## 2019-10-29 MED ORDER — LACTATED RINGERS IV SOLN: INTRAVENOUS | Status: DC

## 2019-10-29 MED ORDER — BACITRACIN ZINC 500 UNIT/GM EX OINT
TOPICAL_OINTMENT | CUTANEOUS | Status: DC | PRN
  Administered 2019-10-29: 1 via TOPICAL

## 2019-10-29 MED ORDER — DEXAMETHASONE SODIUM PHOSPHATE 10 MG/ML IJ SOLN
INTRAMUSCULAR | Status: AC
  Filled 2019-10-29: qty 1

## 2019-10-29 MED ORDER — FENTANYL CITRATE (PF) 250 MCG/5ML IJ SOLN
INTRAMUSCULAR | Status: DC | PRN
  Administered 2019-10-29 (×2): 50 ug via INTRAVENOUS

## 2019-10-29 MED ORDER — SENNOSIDES-DOCUSATE SODIUM 8.6-50 MG PO TABS
1.0000 | ORAL_TABLET | Freq: Two times a day (BID) | ORAL | 0 refills | Status: DC
Start: 2019-10-29 — End: 2020-04-29

## 2019-10-29 MED ORDER — ONDANSETRON HCL 4 MG/2ML IJ SOLN
INTRAMUSCULAR | Status: DC | PRN
Start: 2019-10-29 — End: 2019-10-29
  Administered 2019-10-29: 4 mg via INTRAVENOUS

## 2019-10-29 MED ORDER — ONDANSETRON HCL 4 MG/2ML IJ SOLN
INTRAMUSCULAR | Status: AC
  Filled 2019-10-29: qty 2

## 2019-10-29 MED ORDER — DEXAMETHASONE SODIUM PHOSPHATE 10 MG/ML IJ SOLN
INTRAMUSCULAR | Status: DC | PRN
Start: 2019-10-29 — End: 2019-10-29
  Administered 2019-10-29: 7 mg via INTRAVENOUS

## 2019-10-29 MED ORDER — OXYCODONE-ACETAMINOPHEN 5-325 MG PO TABS: 1.0000 | ORAL_TABLET | ORAL | 0 refills | Status: DC | PRN

## 2019-10-29 MED ORDER — PHENYLEPHRINE 40 MCG/ML (10ML) SYRINGE FOR IV PUSH (FOR BLOOD PRESSURE SUPPORT)
PREFILLED_SYRINGE | INTRAVENOUS | Status: DC | PRN
  Administered 2019-10-29: 120 ug via INTRAVENOUS

## 2019-10-29 MED ORDER — GABAPENTIN 400 MG PO CAPS
800.0000 mg | ORAL_CAPSULE | Freq: Once | ORAL | Status: AC
  Administered 2019-10-29: 800 mg via ORAL
  Filled 2019-10-29: qty 2

## 2019-10-29 MED ORDER — LIDOCAINE HCL (CARDIAC) PF 100 MG/5ML IV SOSY
PREFILLED_SYRINGE | INTRAVENOUS | Status: DC | PRN
  Administered 2019-10-29: 40 mg via INTRAVENOUS

## 2019-10-29 MED ORDER — FENTANYL CITRATE (PF) 100 MCG/2ML IJ SOLN
INTRAMUSCULAR | Status: AC
  Filled 2019-10-29: qty 2

## 2019-10-29 MED ORDER — STERILE WATER FOR IRRIGATION IR SOLN
Status: DC | PRN
  Administered 2019-10-29: 500 mL

## 2019-10-29 MED ORDER — CLINDAMYCIN PHOSPHATE 900 MG/50ML IV SOLN
900.0000 mg | INTRAVENOUS | Status: AC
  Administered 2019-10-29: 900 mg via INTRAVENOUS
  Filled 2019-10-29: qty 50

## 2019-10-29 MED ORDER — PHENYLEPHRINE 40 MCG/ML (10ML) SYRINGE FOR IV PUSH (FOR BLOOD PRESSURE SUPPORT)
PREFILLED_SYRINGE | INTRAVENOUS | Status: DC | PRN
  Administered 2019-10-29: 80 ug via INTRAVENOUS

## 2019-10-29 MED ORDER — FENTANYL CITRATE (PF) 100 MCG/2ML IJ SOLN: 25.0000 ug | INTRAMUSCULAR | Status: DC | PRN

## 2019-10-29 MED ORDER — PROPOFOL 10 MG/ML IV BOLUS
INTRAVENOUS | Status: AC
  Filled 2019-10-29: qty 20

## 2019-10-29 MED ORDER — BACITRACIN ZINC 500 UNIT/GM EX OINT
TOPICAL_OINTMENT | CUTANEOUS | Status: AC
  Filled 2019-10-29: qty 28.35

## 2019-10-29 MED ORDER — PROPOFOL 10 MG/ML IV BOLUS
INTRAVENOUS | Status: DC | PRN
  Administered 2019-10-29: 120 mg via INTRAVENOUS

## 2019-10-29 SURGICAL SUPPLY — 36 items
BAG DRN RND TRDRP ANRFLXCHMBR (UROLOGICAL SUPPLIES) ×1
BAG URINE DRAIN 2000ML AR STRL (UROLOGICAL SUPPLIES) ×1 IMPLANT
BLADE SURG 15 STRL LF DISP TIS (BLADE) ×1 IMPLANT
BLADE SURG 15 STRL SS (BLADE) ×2
BNDG COHESIVE 2X5 TAN STRL LF (GAUZE/BANDAGES/DRESSINGS) ×2 IMPLANT
BNDG CONFORM 2 STRL LF (GAUZE/BANDAGES/DRESSINGS) ×2 IMPLANT
CATH TIEMANN FOLEY 18FR 5CC (CATHETERS) ×1 IMPLANT
COVER SURGICAL LIGHT HANDLE (MISCELLANEOUS) ×1 IMPLANT
COVER WAND RF STERILE (DRAPES) IMPLANT
DECANTER SPIKE VIAL GLASS SM (MISCELLANEOUS) IMPLANT
DRAPE LAPAROTOMY T 98X78 PEDS (DRAPES) ×1 IMPLANT
ELECT NDL TIP 2.8 STRL (NEEDLE) ×1 IMPLANT
ELECT NEEDLE TIP 2.8 STRL (NEEDLE) IMPLANT
ELECT PENCIL ROCKER SW 15FT (MISCELLANEOUS) ×1 IMPLANT
ELECT REM PT RETURN 15FT ADLT (MISCELLANEOUS) ×2 IMPLANT
GAUZE 4X4 16PLY RFD (DISPOSABLE) ×2 IMPLANT
GAUZE SPONGE 4X4 12PLY STRL (GAUZE/BANDAGES/DRESSINGS) ×1 IMPLANT
GLOVE BIOGEL M STRL SZ7.5 (GLOVE) ×2 IMPLANT
GLOVE BIOGEL PI IND STRL 6.5 (GLOVE) IMPLANT
GLOVE BIOGEL PI IND STRL 7.0 (GLOVE) IMPLANT
GLOVE BIOGEL PI INDICATOR 6.5 (GLOVE) ×1
GLOVE BIOGEL PI INDICATOR 7.0 (GLOVE) ×1
GOWN STRL REUS W/TWL LRG LVL3 (GOWN DISPOSABLE) ×4 IMPLANT
KIT BASIN (CUSTOM PROCEDURE TRAY) ×2 IMPLANT
KIT TURNOVER KIT A (KITS) IMPLANT
NDL HYPO 25X1 1.5 SAFETY (NEEDLE) IMPLANT
NEEDLE HYPO 25X1 1.5 SAFETY (NEEDLE) IMPLANT
NS IRRIG 1000ML POUR BTL (IV SOLUTION) IMPLANT
PACK LITHOTOMY IV (CUSTOM PROCEDURE TRAY) ×1 IMPLANT
SUT CHROMIC 4 0 P 3 18 (SUTURE) ×2 IMPLANT
SUT CHROMIC 4 0 PS 2 18 (SUTURE) ×2 IMPLANT
SUT VIC AB 3-0 SH 27 (SUTURE) ×4
SUT VIC AB 3-0 SH 27X BRD (SUTURE) IMPLANT
SYR CONTROL 10ML LL (SYRINGE) IMPLANT
TOWEL OR 17X26 10 PK STRL BLUE (TOWEL DISPOSABLE) ×2 IMPLANT
WATER STERILE IRR 1000ML POUR (IV SOLUTION) IMPLANT

## 2019-10-29 NOTE — Transfer of Care (Signed)
Immediate Anesthesia Transfer of Care Note  Patient: Tim [name]  Procedure(s) Performed: DORSAL SLIT ([name] )  Patient Location: PACU  Anesthesia Type:General  Level of Consciousness: awake, alert  and patient cooperative  Airway & Oxygen Therapy: Patient Spontanous Breathing and Patient connected to face mask oxygen  Post-op Assessment: Report given to RN and Post -op Vital signs reviewed and stable  Post vital signs: Reviewed and stable  Last Vitals:  Vitals Value Taken Time  BP 145/65 10/29/19 1409  Temp    Pulse 73 10/29/19 1414  Resp 14 10/29/19 1414  SpO2 98 % 10/29/19 1414  Vitals shown include unvalidated device data.  Last Pain:  Vitals:   10/29/19 1206  TempSrc:   PainSc: 3       Patients Stated Pain Goal: 4 (95/63/87 5643)  Complications: No apparent anesthesia complications

## 2019-10-29 NOTE — H&P (Signed)
Tim [name] is an 79 y.o. male.    Chief Complaint: Pre-Op Dorsal Slit  HPI:   1 - Severe Phimosis with Balanitis - morbidly obese borderline diabetic with h/o curc many years ago, but buried penis and non-visible glans x years. Dribbles urine into diapers at baseline. New skin erythema and groin-penile pain 3/20201. Treated with PO Diflucan.   2 - Urinary Retention / Large Prostate - Small dribbling urine x mos 09/2019. 100gm prostate by ellipsoid calculation 2021. 423m residual. Started on finasteride 09/2019. S  3 - Urosepsis - Enterococcus urosepsis after episode retention 09/2019 sens ampicillin and treated with prolonged course.   PMH sig for morbid obesity, borederline DM2, toe and partial foot amputation, HTN, Arrythmia / Defibrillator / Eliquus (follows Dr TRadford Paxwith [facility]). He is retired eArt gallery manager/ pScientist, research (physical sciences)having studies at [employer] Most of career with [employer] then finally [employer] before retirement. His PCP is Dr. MAnitra Lauth   Today "Tim [name] is seen to proceed with penile dorsal slit for severe phimosis. NO interval fevers. He has held eliquus as cleared by his [facility] cardiologist.    Past Medical History:  Diagnosis Date  . AICD (automatic cardioverter/defibrillator) present   . ASTHMA   . Asthma    as a child  . Balanitis    +severe phimosis; elective circ recommended by urol as of 10/2019  . BPH (benign prostatic hypertrophy)   . CAD (coronary artery disease)    Nonobstructive by 12/2012 cath; then 03/2016 he required BMS to RCA ([facility]).  In-stent restenosis on cath 11/01/16, baloon angioplasty successful--pt to take plavix (no ASA), + eliquis (for PAF).  .Marland KitchenCATARACT, HX OF   . Chronic combined systolic and diastolic CHF (congestive heart failure) [place] 05/[place]   Ischemic CM.  04/2018 EF 40-45%, grd I DD.  .Marland KitchenChronic pain syndrome    Lumbar DDD; chronic neuropathic pain (DM); has spinal stimulator and sees pain mgmt MD  .  Complicated UTI (urinary tract infection) 09/2019   Phimoses, acute urinary retention, entoerococcus UTI, enterococc bacteremia.  F/u blood clx's neg x 5d.    .Marland KitchenCOPD   . Diabetes mellitus type 2 with complications (HCC)    [id number] from 5.7% to 6.1% 03/2015---started metformin at that time.  DM 2 dx by fasting gluc criteria 2018.  Has chronic neuropath pain  . Elevated transaminase level 02/2016   Suspect due to fatty liver (documented on u/s 2007). Plan repeat labs 03/2016.  .Marland KitchenEnterococcal bacteremia 09/2019   Phimoses, acute urinary retention, entoerococcus UTI, enterococc bacteremia.  F/u blood clx's neg x 5d.    . Essential hypertension, benign   . Fatty liver 2007   2007 u/s showed fatty liver with hepatosplenomegaly.  2019 repeat u/s->fatty liver but no cirrhosis or hepatosplenomegaly.  .Marland KitchenGERD (gastroesophageal reflux disease)   . Glaucoma   . GOUT   . Hallux valgus (acquired)   . HH (hiatus hernia)   . HYPERCHOLESTEROLEMIA-PURE   . Hypogonadism male   . Lumbosacral neuritis   . Lumbosacral spondylosis    Lumbar spinal stenosis with neurogenic claudication--contributes to his chronic pain syndrome  . Normal memory function 08/2014   Neuropsychological testing ([facility]): no cognitive impairment or sign of neurodegenerative disorder.  Likely has adjustment d/o with mixed anxiety/depressed features and may benefit from low dose SNRI.    .Marland KitchenNormocytic anemia 03/2016   Mild-pt needs ferritin and vit B12 level checked (as of 03/22/16). Hb stable 09/2019  .  NSTEMI (non-ST elevated myocardial infarction) ([place]) 03/20/2016   BMS to RCA  . Obesity hypoventilation syndrome ([place])   . Obesity, unspecified   . Orthostatic hypotension   . OSA on CPAP    8 cm H2O  . OSTEOARTHRITIS   . Paroxysmal atrial fibrillation ([place]) 2003    (? chronic?) Off anticoag for a while due to falls.  Then apixaban started 12/2014.  [name] Peripheral neuropathy    DPN (+[facility]; with chronic  neuropathic pain--Dr. Ella Bodo): neuropathic pain->diff to treat, failed nucynta, failed spinal stimul trial, oxycontin hs + tramadol + gabap as of 12/2017 f/u Dr. Letta Pate.  . Personal history of colonic adenoma 10/30/2012   Diminutive adenoma, consider repeat 2019 per GI  . PFO (patent foramen ovale) 09/2019   small, with predominately L to R shunt  . Pneumonia   . Presence of cardiac defibrillator 11/07/2017  . Primary osteoarthritis of both knees    Bone on bone of medial compartments, + signif patellofemoral arth bilat.--supartz inj series started 09/12/17  . PUD (peptic ulcer disease)   . PULMONARY HYPERTENSION, HX OF   . Secondary male hypogonadism 2017  . 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 Shortness of breath    with exertion: much improved s/p TAVR and treatment for CHF.  . Sick sinus syndrome (HCC)    PPM placed  . Thrombocytopenia ([place]) 2018   HSM on 2007 abd u/s---suspect some mild splenic sequestration chronically.  [name] Unspecified glaucoma(365.9)   . Unspecified hereditary and idiopathic peripheral neuropathy approx age 54   bilat LE's, ? left arm, too.  Feet became progressively numb + left foot pain intermittently.  Pt may be trying a spinal stimulator (as of 05/2015)  . VENOUS INSUFFICIENCY    Being followed by Dr. Sharol Given as of 10/2016 for two R LL venous stasis ulcers/skin tears.  Healed as of 10/30/16 f/u with Dr. Sharol Given.  . VENTRAL HERNIA   . Wound dehiscence 10/22/2019    Past Surgical History:  Procedure Laterality Date  . AMPUTATION Left 04/11/2013   Procedure: AMPUTATION DIGIT Left 3rd toe;  Surgeon: [name], MD;  Location: [facility];  Service: Orthopedics;  Laterality: Left;  Left 3rd toe amputation at [facility]  . AMPUTATION Left 08/29/2019   Procedure: LEFT TRANSMETATARSAL AMPUTATION;  Surgeon: [name], MD;  Location: [facility];  Service: Orthopedics;  Laterality: Left;  . CARDIAC CATHETERIZATION   1997; 03/10/16   1997 Non-obstructive disease.  03/2016 BMS to RCA, with 25% pDiag dz, o/w normal cors per cath 03/07/16.  Cath 11/01/16: in stent restenosis, successful baloon angioplasty.  [name] CARDIAC CATHETERIZATION  12/24/2012   mild < 20% LCx, prox 30% RCA; LVEF 55-65% , moderate pulmonary HTN, moderate AS  . CARDIAC DEFIBRILLATOR PLACEMENT  11/07/2017   [facility] MRI Quad CRT defibrillator  . CARDIOVASCULAR STRESS TEST  05/11/16 ([facility])   Myocardial perfusion imaging:  No ischemia; scar in apex, global hypokinesis, EF 36%.  . Carotid dopplers  03/09/2016   [facility]: no hemodynamically significant stenosis on either side.  . CHOLECYSTECTOMY    . COLONOSCOPY    . COLONOSCOPY [name] 10/30/2012   Procedure: COLONOSCOPY;  Surgeon: [name], MD;  Location: [facility];  Service: Endoscopy;  Laterality: [name];  . CORONARY ANGIOPLASTY WITH STENT PLACEMENT  03/2016; 04/2017   2017-[facility]: BMS to RCA-pt was placed on Brilinta.  04/2017: DES to RCA.  [name] EYE SURGERY  Bilateral cataract  . HEMORRHOID SURGERY    . INTRAOCULAR LENS INSERTION Bilateral   . KNEE SURGERY Right   . LEFT AND RIGHT HEART CATHETERIZATION WITH CORONARY ANGIOGRAM [name] 12/24/2012   Procedure: LEFT AND RIGHT HEART CATHETERIZATION WITH CORONARY ANGIOGRAM;  Surgeon: [name], MD;  Location: [facility];  Service: Cardiovascular;  Laterality: [name];  . LEG SURGERY Bilateral    lenghtening   . PACEMAKER PLACEMENT  04/13/2016   2nd deg HB after TAVR, pt had DC MDT PPM placed.  [name] SHOULDER ARTHROSCOPY  08/30/2011   Procedure: ARTHROSCOPY SHOULDER;  Surgeon: [name], MD;  Location: [facility];  Service: Orthopedics;  Laterality: Right;  Right Shoulder Arthroscopy, Debridement, and Decompression  . SPINAL CORD STIMULATOR INSERTION [name] 09/10/2015   Procedure: LUMBAR SPINAL CORD STIMULATOR INSERTION;  Surgeon: [name], MD;  Location: [facility];  Service: Neurosurgery;  Laterality: [name];  . TEE WITHOUT CARDIOVERSION [name] 09/16/2019    Procedure: TRANSESOPHAGEAL ECHOCARDIOGRAM (TEE);  Surgeon: Skeet Latch, MD;  Location: [facility];  Service: Cardiovascular;  Laterality: [name];  . 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  R big toe surg due to osteoarth  . TONSILLECTOMY    . traeculectomy Left    eye  . TRANSCATHETER AORTIC VALVE REPLACEMENT, TRANSFEMORAL  04/11/2016  . TRANSESOPHAGEAL ECHOCARDIOGRAM  03/09/2016; 09/2019   [facility]: EF 55-60%, PFO seen with bi-directional shunting, no thrombus in appendage.  09/2019 ->no valvular vegetations. Small patent foramen ovale with predominantly left to right shunting across the interatrial septum.  . TRANSTHORACIC ECHOCARDIOGRAM  01/2015; 01/2016; 05/18/16; 09/18/16, 05/2017, 08/2017   01/2015 No signif change in aortic stenosis (moderate).  01/2016 Severe LVH w/small LV cavity, EF 60-65%, grade I diast dysfxn.  05/2016 (s/p TAVR): EF 50-55%, grd I DD, biopros AV good.  08/2016--EF 50-55%, LV septal motion c/w conduction abnl, grd I DD,mild MS,bioprosth aortic valve well seated, w/trace AR. 05/2017 TTE EF 35%. 08/2017-EF 35%, mod diff hypokin LV, grd I DD, biopros AV good.   . TRANSTHORACIC ECHOCARDIOGRAM  04/2018; 09/2019   04/2018: EF 40-45%, mod diffuse LV hypokinesis, grd I DD, bioprosth AV well seated, no AS or AR. 09/2019 EF 60-65%, grd I DD, valves fine, including bioprosth AV  . VITRECTOMY      Family History  Problem Relation Age of Onset  . Hypertension Mother   . Coronary artery disease Mother   . Heart attack Mother   . Neuropathy Mother   . Pulmonary fibrosis Father        asbestosis   Social History:  reports that he has never smoked. He has never used smokeless tobacco. He reports that he does not drink alcohol or use drugs.  Allergies:  Allergies  Allergen Reactions  . Brimonidine Tartrate Shortness Of Breath    Alphagan-Shortness of breath  . Brimonidine Tartrate Palpitations  . Brinzolamide Shortness Of Breath    AZOPT- Shortness of breath  . Latanoprost  Shortness Of Breath    XALATAN- Shortness of breath  . Nucynta [Tapentadol] Shortness Of Breath  . Sulfa Antibiotics Palpitations  . Timolol Maleate Shortness Of Breath and Other (See Comments)    TIMOPTIC- Aggravated asthma  . Cardizem  [Diltiazem Hcl] Swelling  . Diltiazem Swelling     leg swelling  . Rofecoxib Swelling     VIOXX- leg swelling  . Vancomycin Hives and Other (See Comments)    Possible "Red Man Syndrome"? > hives/blisters  . Codeine Other (See Comments)  Childhood reaction  . Tamsulosin Other (See Comments)    Dizziness   . Celecoxib Other (See Comments)    CELLBREX-confusion  . Colchicine Diarrhea    diarrhea  . Tape Rash    No medications prior to admission.    No results found. However, due to the size of the patient record, not all encounters were searched. Please check Results Review for a complete set of results. No results found.  Review of Systems  Constitutional: Positive for fatigue. Negative for fever.  Genitourinary: Positive for difficulty urinating.  All other systems reviewed and are negative.   There were no vitals taken for this visit. Physical Exam  Constitutional: He appears well-developed.  Minimally ambulatory, at baseline.   Eyes: Pupils are equal, round, and reactive to light.  Cardiovascular: Normal rate.  Respiratory: Effort normal.  GI:  Morbid truncal obesity limits sensitiviety of exam.   Genitourinary:    Genitourinary Comments: Foley in place with non-foul urine and stable severe phimosis w/o active balanoposthitis.    Musculoskeletal:        General: Normal range of motion.     Cervical back: Normal range of motion.  Neurological: He is alert.  Skin: Skin is warm.     Assessment/Plan  Proceed as planned with penile dorsal slit. Risks, benefits, alternatives, expected peri-op course discussed previously and reiterated today. Will contiue foley post-op for office tiral of void.   Alexis Frock, MD 10/29/2019,  10:30 AM

## 2019-10-29 NOTE — Anesthesia Postprocedure Evaluation (Signed)
Anesthesia Post Note  Patient: Tim [name]  Procedure(s) Performed: DORSAL SLIT ([name] )     Patient location during evaluation: PACU Anesthesia Type: General Level of consciousness: awake Pain management: pain level controlled Vital Signs Assessment: post-procedure vital signs reviewed and stable Respiratory status: spontaneous breathing Cardiovascular status: stable Postop Assessment: no apparent nausea or vomiting Anesthetic complications: no    Last Vitals:  Vitals:   10/29/19 1526 10/29/19 1620  BP: (!) 148/59 138/60  Pulse: 73 76  Resp: 10 12  Temp: 36.4 C (!) 36.4 C  SpO2: 100% 96%    Last Pain:  Vitals:   10/29/19 1620  TempSrc:   PainSc: 0-No pain                 Yannet Rincon

## 2019-10-29 NOTE — Discharge Instructions (Signed)
1 - You may have urinary urgency (bladder spasms) and bloody urine on / off with catheter in place. This is normal.  2 - Skin stitches are dissolvable and will be absorbed in about 3 weeks.   3 - Restart Eliquus on Monday morning.   4 - Call MD or go to ER for fever >102, severe pain / nausea / vomiting not relieved by medications, or acute change in medical status

## 2019-10-29 NOTE — Op Note (Signed)
NAME: Tim [name], GERHOLD MEDICAL RECORD [id number] ACCOUNT [id number] DATE OF BIRTH:04-11-1941 FACILITY: [facility] LOCATION: [facility] David Stall, MD  OPERATIVE REPORT  DATE OF PROCEDURE:  10/29/2019  SURGEON:  Alexis Frock, MD  PREOPERATIVE DIAGNOSES:   1.  Severe phimosis. 2.  Buried penis.  PROCEDURE:  Dorsal slit.  ESTIMATED BLOOD LOSS:  Nil.  COMPLICATIONS:  None.  SPECIMENS:  None.  DRAINS:  18-French Foley catheter to straight drain.  INDICATIONS:  The patient is a very comorbid 79 year old man with recent history of urinary retention, likely multifactorial due to his severe phimosis, as well as prostatic hypertrophy.  He has been placed on finasteride to address his prostatic  hypertrophy.  In terms of his severe phimosis, options were discussed including observation versus surgical therapy.  Given his morbid obesity, it was felt that a dorsal slit would be the most ideal as circumcision would likely remove too much skin.   Informed consent was obtained and placed in the medical record.  DESCRIPTION OF PROCEDURE:  The patient being identified, the procedure being dorsal slit was confirmed.  Procedure timeout was performed.  Intravenous antibiotics administered.  General LMA anesthesia induced.  The patient was placed into a low lithotomy  position, sterile field was created prepping and draping his penis, perineum and proximal thighs using iodine.  Notably, his penis consisted of an inverted area of phimotic foreskin.  In situ Foley catheter was removed.   Dorsal slit was performed by  placing a straight hemostat at 12 o'clock position of the phimotic ring, holding for 90 seconds and then releasing and incised using straight scissors.  This was performed x3 segments, allowing the skin to be incised such that the glans was deliverable.   There were multiple glans and inner foreskin leaflet adhesions as anticipated.  The incision area was only approximately 2  inches.  This did allow the glans and urethral meatus to be visible which was the goal of surgery today.  Point hemostasis was  achieved using coagulation current, Bovie and the 2 skin edges were then reapproximated using running 3-0 Vicryl.  A new 18-French Foley catheter was placed free to straight drain and a mixture of bacitracin was applied to the wound edges and the  procedure was terminated.  The patient tolerated the procedure well.  No immediate complications.  The patient was taken to postanesthesia care unit in stable condition.  Plan for discharge home.  VN/NUANCE  D:10/29/2019 T:10/29/2019 JOB:[id number]

## 2019-10-29 NOTE — Brief Op Note (Signed)
10/29/2019  1:55 PM  PATIENT:  Tim [name]  79 y.o. male  PRE-OPERATIVE DIAGNOSIS:  SEVERE PHIMOSIS  POST-OPERATIVE DIAGNOSIS:  SEVERE PHIMOSIS  PROCEDURE:  Procedure(s) with comments: DORSAL SLIT ([name]) - 45 MINS  SURGEON:  Surgeon(s) and Role:    * Alexis Frock, MD - Primary  PHYSICIAN ASSISTANT:   ASSISTANTS: none   ANESTHESIA:   general  EBL:  5 mL   BLOOD ADMINISTERED:none  DRAINS: 35F foley to gravity   LOCAL MEDICATIONS USED:  NONE  SPECIMEN:  No Specimen  DISPOSITION OF SPECIMEN:  [name]  COUNTS:  YES  TOURNIQUET:  * No tourniquets in log *  DICTATION: .Other Dictation: Dictation Number [id number]  PLAN OF CARE: Discharge to home after PACU  PATIENT DISPOSITION:  PACU - hemodynamically stable.   Delay start of Pharmacological VTE agent (>24hrs) due to surgical blood loss or risk of bleeding: yes

## 2019-10-29 NOTE — Anesthesia Preprocedure Evaluation (Signed)
Anesthesia Evaluation  Patient identified by MRN, date of birth, ID band Patient awake    Reviewed: Allergy & Precautions, NPO status , Patient's Chart, lab work & pertinent test results, reviewed documented beta blocker date and time   Airway Mallampati: II  TM Distance: >3 FB Neck ROM: Full    Dental  (+) Edentulous Upper, Dental Advisory Given   Pulmonary neg pulmonary ROS, shortness of breath, asthma , Continuous Positive Airway Pressure Ventilation , COPD,  COPD inhaler,  H/o pulm htn  PFTs 07/30/18 ([facility]): FEV1 2.47 liters Final  Comment: 86%  FVC 3.57 liters Final  Comment: 80%  FEV1/FVC 69 % Final  Comment: 69%  DLCO 32.9 ml/mmHg sec Final  Comment: 108%  Impression: Mild obstruction.    Pulmonary exam normal breath sounds clear to auscultation       Cardiovascular hypertension, Pt. on home beta blockers and Pt. on medications + CAD, + Past MI, + Cardiac Stents and +CHF  negative cardio ROS Normal cardiovascular examAtrial Fibrillation + pacemaker + Cardiac Defibrillator + Valvular Problems/Murmurs (s/p TAVR 2017) AS  Rhythm:Regular Rate:Normal  TEE 09/2019 EF 60-65%, mild MR, TAVR gradient 31mHg  TTE 09/3019 EF 60-65%, G1DD, The aortic valve has been repaired/replaced. Aortic valve regurgitation is trivial. No aortic stenosis is present. 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 Procedure Date: 04/11/16. Aortic valve mean gradient measures 18.0 mmHg.  Nuclear stress test 07/09/19 ([facility]): IMPRESSION: No evidence of inducible ischemia. Calculated EF is 48%.   s/p BMS RCA 03/10/16; s/p balloon angioplasty RCA for ISR 11/01/16; s/p DES RCA for ISR 04/27/17   Neuro/Psych negative neurological ROS  negative psych ROS   GI/Hepatic negative GI ROS, Neg liver ROS, PUD, GERD  Medicated,  Endo/Other  diabetes, Type 2Morbid obesity (BMI 43)  Renal/GU negative Renal ROS   negative genitourinary   Musculoskeletal  (+) Arthritis ,   Abdominal   Peds  Hematology negative hematology ROS (+)   Anesthesia Other Findings   Reproductive/Obstetrics                             Anesthesia Physical  Anesthesia Plan  ASA: IV  Anesthesia Plan: General   Post-op Pain Management:  Regional for Post-op pain   Induction: Intravenous  PONV Risk Score and Plan: 2 and Propofol infusion, Treatment may vary due to age or medical condition and Ondansetron  Airway Management Planned: LMA  Additional Equipment:   Intra-op Plan:   Post-operative Plan: Extubation in OR  Informed Consent: I have reviewed the patients History and Physical, chart, labs and discussed the procedure including the risks, benefits and alternatives for the proposed anesthesia with the patient or authorized representative who has indicated his/her understanding and acceptance.     Dental advisory given  Plan Discussed with: CRNA  Anesthesia Plan Comments: ( )        Anesthesia Quick Evaluation

## 2019-10-29 NOTE — Anesthesia Procedure Notes (Addendum)
Procedure Name: LMA Insertion Date/Time: 10/29/2019 1:21 PM Performed by: West Pugh, CRNA Pre-anesthesia Checklist: Patient identified, Emergency Drugs available, Suction available, Patient being monitored and Timeout performed Patient Re-evaluated:Patient Re-evaluated prior to induction Oxygen Delivery Method: Circle system utilized Preoxygenation: Pre-oxygenation with 100% oxygen Induction Type: IV induction LMA: LMA with gastric port inserted LMA Size: 5.0 Number of attempts: 1 Placement Confirmation: positive ETCO2 and breath sounds checked- equal and bilateral Tube secured with: Tape Dental Injury: Teeth and Oropharynx as per pre-operative assessment

## 2019-11-05 ENCOUNTER — Ambulatory Visit (INDEPENDENT_AMBULATORY_CARE_PROVIDER_SITE_OTHER): Payer: Medicare Other | Admitting: Infectious Diseases

## 2019-11-05 ENCOUNTER — Encounter: Payer: Self-pay | Admitting: Infectious Diseases

## 2019-11-05 ENCOUNTER — Other Ambulatory Visit: Payer: Self-pay

## 2019-11-05 VITALS — BP 96/60 | HR 80 | Temp 98.6°F

## 2019-11-05 DIAGNOSIS — B952 Enterococcus as the cause of diseases classified elsewhere: Secondary | ICD-10-CM

## 2019-11-05 DIAGNOSIS — R7881 Bacteremia: Secondary | ICD-10-CM

## 2019-11-05 DIAGNOSIS — T8130XA Disruption of wound, unspecified, initial encounter: Secondary | ICD-10-CM

## 2019-11-05 DIAGNOSIS — Z4502 Encounter for adjustment and management of automatic implantable cardiac defibrillator: Secondary | ICD-10-CM

## 2019-11-05 DIAGNOSIS — R031 Nonspecific low blood-pressure reading: Secondary | ICD-10-CM

## 2019-11-05 DIAGNOSIS — Z952 Presence of prosthetic heart valve: Secondary | ICD-10-CM

## 2019-11-05 DIAGNOSIS — I959 Hypotension, unspecified: Secondary | ICD-10-CM | POA: Insufficient documentation

## 2019-11-05 NOTE — Assessment & Plan Note (Signed)
Wound looks to be healing in nicely today. He completed Linezolid yesterday and has follow up with Dr. Sharol Given in a few weeks. Will have him continue with current prescribed wound care and compression therapy as directed from Dr. Sharol Given and return in 2 weeks to follow closely with Dr. Tommy Medal.

## 2019-11-05 NOTE — Patient Instructions (Signed)
Nice to see your foot is healing up.   I want you to continue monitoring your foot for any signs of recurrent infections and call us with any changes. Please continue your wound care as ordered by Dr. Sharol Given.   Will have you back to see Dr. Tommy Medal in 2 weeks to check in off all antibiotics to ensure you are still doing well and repeat your blood work at that visit.   For your blood pressure - I worry you blood pressures have been too low lately and this is making you dizzy.  Please check your blood pressure this evening before you would normally take you next dose of blood pressure medication and I would like for you to hold it if you are still < 100 for the top number.   If you find you are still low tomorrow I would like for you to let you cardiology team know so they can give you further instruction.   Return in 2 weeks with Dr. Tommy Medal.

## 2019-11-05 NOTE — Progress Notes (Signed)
Patient: Tim [name]  DOB: Sep 02, 1940 MRN: [id number] PCP: Tim Sou, MD     Patient Active Problem List   Diagnosis Date Noted  . Low blood pressure reading 11/05/2019  . Wound dehiscence 10/22/2019  . ICD (implantable cardioverter-defibrillator) battery depletion   . Pressure injury of skin 09/13/2019  . Enterococcal bacteremia 09/13/2019  . 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  . 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 Urinary retention 09/13/2019  . Phimosis 09/13/2019  . SIRS (systemic inflammatory response syndrome) ([place]) 09/12/2019  . Sepsis ([place]) 09/12/2019  . Enterococcus UTI 09/12/2019  . Cellulitis of foot   . History of transmetatarsal amputation of left foot ([place])   . Subacute osteomyelitis, left ankle and foot ([place])   . 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 Diabetic neuropathy, painful ([place]) 12/26/2016  . Neuropathic pain of foot, left 06/20/2016  . 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 Status post amputation of left great toe ([place]) 06/08/2015  . Spinal stenosis of lumbar region 02/16/2015  . Hereditary and idiopathic peripheral neuropathy 01/12/2015  . Oral thrush 12/28/2014  . Postural dizziness 05/24/2014  . Paresthesias with subjective weakness 01/30/2014  . Bilateral thigh pain 01/30/2014  . Abdominal wall pain in right upper quadrant 01/22/2014  . Gross hematuria 09/25/2013  . Intertrigo 09/25/2013  . IBS (irritable bowel syndrome) 09/11/2013  . Chronic pain syndrome 08/06/2013  . CAD (coronary artery disease), native coronary artery 12/25/2012  . Paroxysmal atrial fibrillation ([place]) 12/25/2012  . Moderate aortic stenosis 12/25/2012  . Aortic stenosis 12/24/2012  . Personal history of colonic adenoma 10/30/2012  . Diverticulosis of colon (without mention of hemorrhage) 10/30/2012  . Internal hemorrhoids 10/30/2012    . Change in bowel habits intermittent loose stools 10/30/2012  . Routine health maintenance 06/09/2012  . Hereditary peripheral neuropathy 10/10/2011  . Long term current use of anticoagulant - apixiban 08/16/2010  . Dyspnea on exertion 09/08/2009  . HYPERCHOLESTEROLEMIA-PURE 01/13/2009  . Essential hypertension, benign 01/13/2009  . EDEMA 01/13/2009  . GOUT 01/12/2009  . GLAUCOMA 01/12/2009  . Venous insufficiency of both lower extremities 01/12/2009  . COPD (chronic obstructive pulmonary disease) ([place]) 01/12/2009  . VENTRAL HERNIA 01/12/2009  . OSTEOARTHRITIS 01/12/2009  . ARTHRITIS 01/12/2009  . DEGENERATIVE DISC DISEASE 01/12/2009  . CATARACT, HX OF 01/12/2009  . HEART MURMUR, HX OF 01/12/2009  . BENIGN PROSTATIC HYPERTROPHY, HX OF 01/12/2009  . Obesity 01/17/2007  . OBSTRUCTIVE SLEEP APNEA 01/17/2007  . ASTHMA 01/17/2007  . GERD 01/17/2007  . HALLUX VALGUS, ACQUIRED 01/17/2007  . PULMONARY HYPERTENSION, HX OF 01/17/2007     Subjective:  Tim [name] is a 79 y.o. male here for 2 week follow up of his left transmetatarsal amputation surgical incision.   He was recently hospitalized for enterococcus faecalis bacteremia related to UTI on 09/11/2019. He received a TEE to evaluate pacemaker and history of TAVR - these were both clean and there was no concern with any native valves either.  He was treated through March 26th with ampicillin via continuous infusion.   He reports that after he switched from Doxycycline to Linezolid his foot cleared up and improved within 48 hours. He had follow up with Tim [name] 4/26 which revealed improvement and no significant residual cellulitis to the foot. He completed the linezolid yesterday.  He is completely insensate. He has used compression therapy and foot elevation with iodosorb and frequent dressing changes to  the TM site to help heal the wound and reports it is much improved today. Usually only has some thin yellow drainage he thinks is  more related to dressing material. No fevers or chills. Last plain film was 3/12 of this foot that revealed no osteomyelitis.   He has continued to have trouble with dizziness and "tunnel vision" at times with movement. He had blood pressure medications adjusted previously and it initially helped him but he has continued to have dizziness more recently. He did have vomiting once and nausea on the current antibiotic but describes that his ability to eat/drink was normal for him.   He has no other wounds to the legs but did have a surgical intervention on the foreskin that he reports is healing very well without concern.    Review of Systems  Constitutional: Negative for chills, fever, malaise/fatigue and weight loss.  HENT: Negative for sore throat.        No dental problems  Respiratory: Negative for cough and sputum production.   Cardiovascular: Positive for leg swelling. Negative for chest pain.  Gastrointestinal: Negative for abdominal pain, diarrhea and vomiting.  Genitourinary: Negative for dysuria and flank pain.  Musculoskeletal: Negative for joint pain, myalgias and neck pain.  Skin: Negative for rash.  Neurological: Positive for dizziness and weakness. Negative for tingling and headaches.  Psychiatric/Behavioral: Negative for depression and substance abuse. The patient is not nervous/anxious and does not have insomnia.     Past Medical History:  Diagnosis Date  . AICD (automatic cardioverter/defibrillator) present   . ASTHMA   . Asthma    as a child  . Balanitis    +severe phimosis; elective circ recommended by urol as of 10/2019  . BPH (benign prostatic hypertrophy)   . CAD (coronary artery disease)    Nonobstructive by 12/2012 cath; then 03/2016 he required BMS to RCA ([facility]).  In-stent restenosis on cath 11/01/16, baloon angioplasty successful--pt to take plavix (no ASA), + eliquis (for PAF).  [name] CATARACT, HX OF   . Chronic combined systolic and diastolic CHF (congestive  heart failure) ([place]) 05/2016   Ischemic CM.  04/2018 EF 40-45%, grd I DD.  [name] Chronic pain syndrome    Lumbar DDD; chronic neuropathic pain (DM); has spinal stimulator and sees pain mgmt MD  . Complicated UTI (urinary tract infection) 09/2019   Phimoses, acute urinary retention, entoerococcus UTI, enterococc bacteremia.  F/u blood clx's neg x 5d.    [name] COPD   . Diabetes mellitus type 2 with complications (HCC)    KMQ2M jumped from 5.7% to 6.1% 03/2015---started metformin at that time.  DM 2 dx by fasting gluc criteria 2018.  Has chronic neuropath pain  . Elevated transaminase level 02/2016   Suspect due to fatty liver (documented on u/s 2007). Plan repeat labs 03/2016.  [name] Enterococcal bacteremia 09/2019   Phimoses, acute urinary retention, entoerococcus UTI, enterococc bacteremia.  F/u blood clx's neg x 5d.    . Essential hypertension, benign   . Fatty liver 2007   2007 u/s showed fatty liver with hepatosplenomegaly.  2019 repeat u/s->fatty liver but no cirrhosis or hepatosplenomegaly.  [name] GERD (gastroesophageal reflux disease)   . Glaucoma   . GOUT   . Hallux valgus (acquired)   . HH (hiatus hernia)   . HYPERCHOLESTEROLEMIA-PURE   . Hypogonadism male   . Lumbosacral neuritis   . Lumbosacral spondylosis    Lumbar spinal stenosis with neurogenic claudication--contributes to his chronic pain syndrome  . Normal memory function  08/2014   Neuropsychological testing ([facility]): no cognitive impairment or sign of neurodegenerative disorder.  Likely has adjustment d/o with mixed anxiety/depressed features and may benefit from low dose SNRI.    [name] Normocytic anemia 03/2016   Mild-pt needs ferritin and vit B12 level checked (as of 03/22/16). Hb stable 09/2019  . NSTEMI (non-ST elevated myocardial infarction) ([place]) 03/20/2016   BMS to RCA  . Obesity hypoventilation syndrome ([place])   . Obesity, unspecified   . Orthostatic hypotension   . OSA on CPAP    8 cm H2O  . OSTEOARTHRITIS   .  Paroxysmal atrial fibrillation ([place]) 2003    (? chronic?) Off anticoag for a while due to falls.  Then apixaban started 12/2014.  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 Ella Bodo): neuropathic pain->diff to treat, failed nucynta, failed spinal stimul trial, oxycontin hs + tramadol + gabap as of 12/2017 f/u Dr. Letta Pate.  . Personal history of colonic adenoma 10/30/2012   Diminutive adenoma, consider repeat 2019 per GI  . PFO (patent foramen ovale) 09/2019   small, with predominately L to R shunt  . Pneumonia   . Presence of cardiac defibrillator 11/07/2017  . Primary osteoarthritis of both knees    Bone on bone of medial compartments, + signif patellofemoral arth bilat.--supartz inj series started 09/12/17  . PUD (peptic ulcer disease)   . PULMONARY HYPERTENSION, HX OF   . Secondary male hypogonadism 2017  . 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 Shortness of breath    with exertion: much improved s/p TAVR and treatment for CHF.  . Sick sinus syndrome (HCC)    PPM placed  . Thrombocytopenia ([place]) 2018   HSM on 2007 abd u/s---suspect some mild splenic sequestration chronically.  [name] Unspecified glaucoma(365.9)   . Unspecified hereditary and idiopathic peripheral neuropathy approx age 63   bilat LE's, ? left arm, too.  Feet became progressively numb + left foot pain intermittently.  Pt may be trying a spinal stimulator (as of 05/2015)  . VENOUS INSUFFICIENCY    Being followed by Tim [name] as of 10/2016 for two R LL venous stasis ulcers/skin tears.  Healed as of 10/30/16 f/u with Tim [name].  . VENTRAL HERNIA   . Wound dehiscence 10/22/2019    Outpatient Medications Prior to Visit  Medication Sig Dispense Refill  . albuterol (PROVENTIL HFA;VENTOLIN HFA) 108 (90 BASE) MCG/ACT inhaler Inhale 2 puffs into the lungs 4 (four) times daily as needed for wheezing or shortness of breath.    .  allopurinol (ZYLOPRIM) 300 MG tablet TAKE 1 TABLET BY MOUTH ONCE DAILY WITH FOOD (Patient taking differently: Take 300 mg by mouth daily. ) 90 tablet 1  . ASPIRIN LOW DOSE 81 MG EC tablet Take 81 mg by mouth daily.    . B Complex-C (B-COMPLEX WITH VITAMIN C) tablet Take 1 tablet by mouth daily. Take 1 tablet daily, 237m    . ezetimibe (ZETIA) 10 MG tablet take 1 tablet by mouth once daily (Patient taking differently: Take 10 mg by mouth daily. ) 90 tablet 3  . finasteride (PROSCAR) 5 MG tablet TAKE 1 TABLET BY MOUTH EVERY DAY (Patient taking differently: Take 5 mg by mouth daily. ) 90 tablet 0  . fluticasone (FLONASE) 50 MCG/ACT nasal spray Place 2 sprays into both nostrils as needed for allergies or rhinitis.    . Fluticasone-Salmeterol (ADVAIR) 250-50  MCG/DOSE AEPB Inhale 1 puff into the lungs 2 (two) times daily.    [name] gabapentin (NEURONTIN) 800 MG tablet TAKE 1 TABLET (800 MG TOTAL) BY MOUTH 4 (FOUR) TIMES DAILY. 360 tablet 2  . isosorbide mononitrate (IMDUR) 60 MG 24 hr tablet TAKE 1 TABLET BY MOUTH EVERY DAY (Patient taking differently: Take 30 mg by mouth daily. ) 90 tablet 3  . linezolid (ZYVOX) 600 MG tablet Take 1 tablet (600 mg total) by mouth 2 (two) times daily for 14 days. 28 tablet 1  . metoprolol succinate (TOPROL-XL) 50 MG 24 hr tablet Take 50 mg by mouth daily. Take along with the 4m tablet to make total dose of 73m   . Multiple Vitamin (MULTIVITAMIN) capsule Take 1 capsule by mouth daily.    . niacin (NIASPAN) 1000 MG CR tablet TAKE 1 TABLET BY MOUTH TWICE A DAY (Patient taking differently: Take 1,000 mg by mouth 2 (two) times daily. ) 180 tablet 1  . nitroGLYCERIN (NITROSTAT) 0.4 MG SL tablet Place 0.4 mg under the tongue every 5 (five) minutes as needed for chest pain.     . Marland Kitchenmeprazole (PRILOSEC) 10 MG capsule TAKE 1 CAPSULE BY MOUTH EVERY DAY 90 capsule 0  . oxyCODONE-acetaminophen (PERCOCET) 5-325 MG tablet Take 1 tablet by mouth every 4 (four) hours as needed for moderate  pain or severe pain. Post-operatively 20 tablet 0  . rosuvastatin (CRESTOR) 40 MG tablet take 1 tablet by mouth once daily (Patient taking differently: Take 40 mg by mouth daily. ) 15 tablet 0  . sacubitril-valsartan (ENTRESTO) 24-26 MG Take 0.5 tablets by mouth 2 (two) times daily.     . Marland Kitchenenna-docusate (SENOKOT-S) 8.6-50 MG tablet Take 1 tablet by mouth 2 (two) times daily. While taking strong pain meds to prevent constipation. 10 tablet 0  . torsemide (DEMADEX) 20 MG tablet Take 20 mg by mouth daily as needed (swelling).    . vitamin C (ASCORBIC ACID) 250 MG tablet Take 250 mg by mouth daily.    . Marland KitchenITAMIN D, CHOLECALCIFEROL, PO Take 1 tablet by mouth daily.      No facility-administered medications prior to visit.     Allergies  Allergen Reactions  . Brimonidine Tartrate Shortness Of Breath    Alphagan-Shortness of breath  . Brimonidine Tartrate Palpitations  . Brinzolamide Shortness Of Breath    AZOPT- Shortness of breath  . Latanoprost Shortness Of Breath    XALATAN- Shortness of breath  . Nucynta [Tapentadol] Shortness Of Breath  . Sulfa Antibiotics Palpitations  . Timolol Maleate Shortness Of Breath and Other (See Comments)    TIMOPTIC- Aggravated asthma  . Cardizem  [Diltiazem Hcl] Swelling  . Diltiazem Swelling     leg swelling  . Rofecoxib Swelling     VIOXX- leg swelling  . Vancomycin Hives and Other (See Comments)    Possible "Red Man Syndrome"? > hives/blisters  . Codeine Other (See Comments)    Childhood reaction  . Tamsulosin Other (See Comments)    Dizziness   . Celecoxib Other (See Comments)    CELLBREX-confusion  . Colchicine Diarrhea    diarrhea  . Tape Rash    Social History   Tobacco Use  . Smoking status: Never Smoker  . Smokeless tobacco: Never Used  Substance Use Topics  . Alcohol use: No    Alcohol/week: 0.0 standard drinks  . Drug use: No    Types: Oxycodone    Objective:   Vitals:   11/05/19 1411  BP: 96/60  Pulse: 80  Temp: 98.6  F (37 C)  TempSrc: Oral   There is no height or weight on file to calculate BMI.   Physical Exam Vitals reviewed.  Constitutional:      Appearance: He is well-developed.     Comments: Seated comfortably in wheelchair without distress.   HENT:     Mouth/Throat:     Dentition: Normal dentition. No dental abscesses.  Cardiovascular:     Rate and Rhythm: Normal rate and regular rhythm.     Heart sounds: Normal heart sounds.  Pulmonary:     Effort: Pulmonary effort is normal.     Breath sounds: Normal breath sounds.  Abdominal:     General: There is no distension.     Palpations: Abdomen is soft.     Tenderness: There is no abdominal tenderness.  Musculoskeletal:     Comments: See foot below   Lymphadenopathy:     Cervical: No cervical adenopathy.  Skin:    General: Skin is warm and dry.     Findings: No rash.  Neurological:     Mental Status: He is alert and oriented to person, place, and time.  Psychiatric:        Judgment: Judgment normal.     Comments: In good spirits today and engaged in care discussion.     L foot incision as pictured below. Wound bed nearly 100% red. Clear thin drainage in wound bed but nothing expressed. No peri-wound erythema or induration.  Scab to left lateral portion of incision.      Lab Results: Lab Results  Component Value Date   WBC 6.4 10/23/2019   HGB 11.0 (L) 10/23/2019   HCT 34.5 (L) 10/23/2019   MCV 94.5 10/23/2019   PLT 167 10/23/2019    Lab Results  Component Value Date   CREATININE 0.88 10/02/2019   BUN 19 10/02/2019   NA 138 10/02/2019   K 4.2 10/02/2019   CL 105 10/02/2019   CO2 24 10/02/2019    Lab Results  Component Value Date   ALT 22 10/02/2019   AST 31 10/02/2019   ALKPHOS 111 09/16/2019   BILITOT 0.7 10/02/2019     Assessment & Plan:   Problem List Items Addressed This Visit      Unprioritized   Enterococcal bacteremia    It sounds like he has been on antibiotics pretty consistently since his  hospitalization. Would like to have him back for consideration of repeat blood cultures in 2 weeks to ensure his bacteremia is truly cleared [name] his risk with cardiac devices in place with higher risk pathogen.  He appreciated the close follow up.       S/P TAVR (transcatheter aortic valve replacement) - Primary   ICD (implantable cardioverter-defibrillator) battery depletion   Wound dehiscence    Wound looks to be healing in nicely today. He completed Linezolid yesterday and has follow up with Tim [name] in a few weeks. Will have him continue with current prescribed wound care and compression therapy as directed from Tim [name] and return in 2 weeks to follow closely with Dr. Tommy Medal.       Low blood pressure reading    BP Readings from Last 3 Encounters:  11/05/19 96/60  10/29/19 138/60  10/23/19 (!) 153/81    His blood pressure is significantly lower today than normal and he has been having dizziness associated with this. I recommended he hold his blood pressure medication this evening and repeat his blood pressure in  the morning. If still < 100 to call his cardiology team for further instructions.          Janene Madeira, MSN, NP-C [name] for Infectious [place] Pager: [phone] Office: [phone]  11/05/19  3:14 PM

## 2019-11-05 NOTE — Assessment & Plan Note (Signed)
BP Readings from Last 3 Encounters:  11/05/19 96/60  10/29/19 138/60  10/23/19 (!) 153/81    His blood pressure is significantly lower today than normal and he has been having dizziness associated with this. I recommended he hold his blood pressure medication this evening and repeat his blood pressure in the morning. If still < 100 to call his cardiology team for further instructions.

## 2019-11-05 NOTE — Assessment & Plan Note (Signed)
It sounds like he has been on antibiotics pretty consistently since his hospitalization. Would like to have him back for consideration of repeat blood cultures in 2 weeks to ensure his bacteremia is truly cleared given his risk with cardiac devices in place with higher risk pathogen.  He appreciated the close follow up.

## 2019-11-08 ENCOUNTER — Encounter: Payer: Self-pay | Admitting: Family Medicine

## 2019-11-09 ENCOUNTER — Other Ambulatory Visit: Payer: Self-pay | Admitting: Physician Assistant

## 2019-11-24 ENCOUNTER — Ambulatory Visit: Payer: Medicare Other | Admitting: Orthopedic Surgery

## 2019-11-26 ENCOUNTER — Ambulatory Visit (INDEPENDENT_AMBULATORY_CARE_PROVIDER_SITE_OTHER): Payer: Medicare Other | Admitting: Infectious Disease

## 2019-11-26 ENCOUNTER — Encounter: Payer: Self-pay | Admitting: Infectious Disease

## 2019-11-26 ENCOUNTER — Other Ambulatory Visit: Payer: Self-pay

## 2019-11-26 VITALS — BP 121/76 | HR 98 | Temp 98.9°F

## 2019-11-26 DIAGNOSIS — R7881 Bacteremia: Secondary | ICD-10-CM | POA: Diagnosis not present

## 2019-11-26 DIAGNOSIS — R42 Dizziness and giddiness: Secondary | ICD-10-CM

## 2019-11-26 DIAGNOSIS — R509 Fever, unspecified: Secondary | ICD-10-CM | POA: Diagnosis not present

## 2019-11-26 DIAGNOSIS — B952 Enterococcus as the cause of diseases classified elsewhere: Secondary | ICD-10-CM

## 2019-11-26 DIAGNOSIS — T8130XA Disruption of wound, unspecified, initial encounter: Secondary | ICD-10-CM

## 2019-11-26 DIAGNOSIS — Z952 Presence of prosthetic heart valve: Secondary | ICD-10-CM

## 2019-11-26 DIAGNOSIS — M8600 Acute hematogenous osteomyelitis, unspecified site: Secondary | ICD-10-CM

## 2019-11-26 DIAGNOSIS — M86272 Subacute osteomyelitis, left ankle and foot: Secondary | ICD-10-CM

## 2019-11-26 DIAGNOSIS — Z899 Acquired absence of limb, unspecified: Secondary | ICD-10-CM

## 2019-11-26 DIAGNOSIS — Z95810 Presence of automatic (implantable) cardiac defibrillator: Secondary | ICD-10-CM

## 2019-11-26 LAB — CBC WITH DIFFERENTIAL/PLATELET
Abs Immature Granulocytes: 0.03 10*3/uL (ref 0.00–0.07)
Basophils Absolute: 0 10*3/uL (ref 0.0–0.1)
Basophils Relative: 0 %
Eosinophils Absolute: 0 10*3/uL (ref 0.0–0.5)
Eosinophils Relative: 0 %
HCT: 35.6 % — ABNORMAL LOW (ref 39.0–52.0)
Hemoglobin: 11.1 g/dL — ABNORMAL LOW (ref 13.0–17.0)
Immature Granulocytes: 0 %
Lymphocytes Relative: 11 %
Lymphs Abs: 0.8 10*3/uL (ref 0.7–4.0)
MCH: 29.1 pg (ref 26.0–34.0)
MCHC: 31.2 g/dL (ref 30.0–36.0)
MCV: 93.2 fL (ref 80.0–100.0)
Monocytes Absolute: 0.9 10*3/uL (ref 0.1–1.0)
Monocytes Relative: 11 %
Neutro Abs: 5.9 10*3/uL (ref 1.7–7.7)
Neutrophils Relative %: 78 %
Platelets: 222 10*3/uL (ref 150–400)
RBC: 3.82 MIL/uL — ABNORMAL LOW (ref 4.22–5.81)
RDW: 15.4 % (ref 11.5–15.5)
WBC: 7.7 10*3/uL (ref 4.0–10.5)
nRBC: 0 % (ref 0.0–0.2)

## 2019-11-26 LAB — COMPREHENSIVE METABOLIC PANEL
ALT: 31 U/L (ref 0–44)
AST: 30 U/L (ref 15–41)
Albumin: 2.9 g/dL — ABNORMAL LOW (ref 3.5–5.0)
Alkaline Phosphatase: 67 U/L (ref 38–126)
Anion gap: 12 (ref 5–15)
BUN: 10 mg/dL (ref 8–23)
CO2: 24 mmol/L (ref 22–32)
Calcium: 8.8 mg/dL — ABNORMAL LOW (ref 8.9–10.3)
Chloride: 103 mmol/L (ref 98–111)
Creatinine, Ser: 0.78 mg/dL (ref 0.61–1.24)
GFR calc Af Amer: >60 mL/min (ref >60)
GFR calc non Af Amer: >60 mL/min (ref >60)
Glucose, Bld: 154 mg/dL — ABNORMAL HIGH (ref 70–99)
Potassium: 4.3 mmol/L (ref 3.5–5.1)
Sodium: 139 mmol/L (ref 135–145)
Total Bilirubin: 0.8 mg/dL (ref 0.3–1.2)
Total Protein: 6 g/dL — ABNORMAL LOW (ref 6.5–8.1)

## 2019-11-26 LAB — C-REACTIVE PROTEIN: CRP: 2.5 mg/dL — ABNORMAL HIGH (ref <1.0)

## 2019-11-26 MED ORDER — AMOXICILLIN-POT CLAVULANATE 875-125 MG PO TABS
1.0000 | ORAL_TABLET | Freq: Two times a day (BID) | ORAL | 1 refills | Status: DC
Start: 2019-11-26 — End: 2019-12-07

## 2019-11-26 NOTE — Progress Notes (Signed)
Chief complaint: fevers to 103 dizziness with  standing Subjective:    Patient ID: Tim [name], male    DOB: [birth year], 79 y.o.   MRN: [id number]  HPI  79 y.o. male with  AMP sensitive enterococcal bacteremia from complicated urinary tract infection who also has an AICD and a TAVR.  Transthoracic echocardiogram and transesophageal echocardiogram were both fortunately without evidence of infection of valves or device.He also recently underwent transmetatarsal amputation by Dr. Sharol Given for severe infection in his left foot.  I saw the patient as an inpatient at [facility].  We gave him a 2-week course of ampicillin IV which he completed several  Weeks before his last clinic meeting with me.  He has been following with Dr. Sharol Given for his amputation site which is developed worsening erythema and drainage.  Dr. Sharol Given placed him on doxycycline but despite the doxycycline the patient has had systemic symptoms of malaise and fever to 101 degrees.  Areas also become more erythematous in the interim.   I ot  blood cultures and labs today and change him from doxycycline to Zyvox.  He saw Janene Madeira in followup on the 5th of May. He  Reported significant imporvement in erythema  In  His foot in  24  Hours after starting zyvox. He completed zyvox prior to  Visit with Colletta [place].  He was continuing to have problems with dizziness and tunnel vision.    He did have a surgical  Intervention on his foreskin that I am only now reading about on more closely reading Stephanie's note.   He has been complaining on and off of fevers to 102 to 103  Every other day. These came on shortly after stopping his zyvox.  He has had worsening dizziness with standing despite stopping his metoprolol and his diuretic and taking half of an imdur.  When I  Asked him about wounds besides his amputation site he  Told me  He had none. He was having continued dizziness at that visit     Past Medical History:    Diagnosis Date  . AICD (automatic cardioverter/defibrillator) present   . ASTHMA   . Asthma    as a child  . Balanitis    +severe phimosis+ buried penis->circ not possible but dorsal slit done 10/2019  . BPH (benign prostatic hypertrophy)    with urinary retention  . CAD (coronary artery disease)    Nonobstructive by 12/2012 cath; then 03/2016 he required BMS to RCA ([facility]).  In-stent restenosis on cath 11/01/16, baloon angioplasty successful--pt to take plavix (no ASA), + eliquis (for PAF).  [name] CATARACT, HX OF   . Chronic combined systolic and diastolic CHF (congestive heart failure) ([place]) 05/2016   Ischemic CM.  04/2018 EF 40-45%, grd I DD.  [name] Chronic pain syndrome    Lumbar DDD; chronic neuropathic pain (DM); has spinal stimulator and sees pain mgmt MD  . Complicated UTI (urinary tract infection) 09/2019   Phimoses, acute urinary retention, entoerococcus UTI, enterococc bacteremia.  F/u blood clx's neg x 5d.    [name] COPD   . Diabetes mellitus type 2 with complications (HCC)    ACZ6S jumped from 5.7% to 6.1% 03/2015---started metformin at that time.  DM 2 dx by fasting gluc criteria 2018.  Has chronic neuropath pain  . Elevated transaminase level 02/2016   Suspect due to fatty liver (documented on u/s 2007). Plan repeat labs 03/2016.  [name] Enterococcal bacteremia 09/2019   Phimoses, acute urinary retention, entoerococcus UTI,  enterococc bacteremia.  F/u blood clx's neg x 5d.    . Essential hypertension, benign   . Fatty liver 2007   2007 u/s showed fatty liver with hepatosplenomegaly.  2019 repeat u/s->fatty liver but no cirrhosis or hepatosplenomegaly.  [name] GERD (gastroesophageal reflux disease)   . Glaucoma   . GOUT   . Hallux valgus (acquired)   . HH (hiatus hernia)   . HYPERCHOLESTEROLEMIA-PURE   . Hypogonadism male   . Lumbosacral neuritis   . Lumbosacral spondylosis    Lumbar spinal stenosis with neurogenic claudication--contributes to his chronic pain syndrome  . Normal memory  function 08/2014   Neuropsychological testing ([facility]): no cognitive impairment or sign of neurodegenerative disorder.  Likely has adjustment d/o with mixed anxiety/depressed features and may benefit from low dose SNRI.    [name] Normocytic anemia 03/2016   Mild-pt needs ferritin and vit B12 level checked (as of 03/22/16). Hb stable 09/2019  . NSTEMI (non-ST elevated myocardial infarction) ([place]) 03/20/2016   BMS to RCA  . Obesity hypoventilation syndrome ([place])   . Obesity, unspecified   . Orthostatic hypotension   . OSA on CPAP    8 cm H2O  . OSTEOARTHRITIS   . Paroxysmal atrial fibrillation ([place]) 2003    (? chronic?) Off anticoag for a while due to falls.  Then apixaban started 12/2014.  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 Ella Bodo): neuropathic pain->diff to treat, failed nucynta, failed spinal stimul trial, oxycontin hs + tramadol + gabap as of 12/2017 f/u Dr. Letta Pate.  . Personal history of colonic adenoma 10/30/2012   Diminutive adenoma, consider repeat 2019 per GI  . PFO (patent foramen ovale) 09/2019   small, with predominately L to R shunt  . Pneumonia   . Presence of cardiac defibrillator 11/07/2017  . Primary osteoarthritis of both knees    Bone on bone of medial compartments, + signif patellofemoral arth bilat.--supartz inj series started 09/12/17  . PUD (peptic ulcer disease)   . PULMONARY HYPERTENSION, HX OF   . Secondary male hypogonadism 2017  . 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 Shortness of breath    with exertion: much improved s/p TAVR and treatment for CHF.  . Sick sinus syndrome (HCC)    PPM placed  . Thrombocytopenia ([place]) 2018   HSM on 2007 abd u/s---suspect some mild splenic sequestration chronically.  [name] Unspecified glaucoma(365.9)   . Unspecified hereditary and idiopathic peripheral neuropathy approx age 40   bilat LE's, ? left arm,  too.  Feet became progressively numb + left foot pain intermittently.  Pt may be trying a spinal stimulator (as of 05/2015)  . VENOUS INSUFFICIENCY    Being followed by Dr. Sharol Given as of 10/2016 for two R LL venous stasis ulcers/skin tears.  Healed as of 10/30/16 f/u with Dr. Sharol Given.  . VENTRAL HERNIA   . Wound dehiscence 10/22/2019    Past Surgical History:  Procedure Laterality Date  . AMPUTATION Left 04/11/2013   Procedure: AMPUTATION DIGIT Left 3rd toe;  Surgeon: [name], MD;  Location: [place];  Service: Orthopedics;  Laterality: Left;  Left 3rd toe amputation at [facility]  . AMPUTATION Left 08/29/2019   Procedure: LEFT TRANSMETATARSAL AMPUTATION;  Surgeon: [name], MD;  Location: [place];  Service: Orthopedics;  Laterality: Left;  . CARDIAC CATHETERIZATION  1997; 03/10/16   1997 Non-obstructive disease.  03/2016 BMS  to RCA, with 25% pDiag dz, o/w normal cors per cath 03/07/16.  Cath 11/01/16: in stent restenosis, successful baloon angioplasty.  [name] CARDIAC CATHETERIZATION  12/24/2012   mild < 20% LCx, prox 30% RCA; LVEF 55-65% , moderate pulmonary HTN, moderate AS  . CARDIAC DEFIBRILLATOR PLACEMENT  11/07/2017   [facility] MRI Quad CRT defibrillator  . CARDIOVASCULAR STRESS TEST  05/11/16 ([facility])   Myocardial perfusion imaging:  No ischemia; scar in apex, global hypokinesis, EF 36%.  . Carotid dopplers  03/09/2016   [facility]: no hemodynamically significant stenosis on either side.  . CHOLECYSTECTOMY    . COLONOSCOPY    . COLONOSCOPY [name] 10/30/2012   Procedure: COLONOSCOPY;  Surgeon: [name], MD;  Location: [facility];  Service: Endoscopy;  Laterality: [name];  . CORONARY ANGIOPLASTY WITH STENT PLACEMENT  03/2016; 04/2017   2017-[facility]: BMS to RCA-pt was placed on Brilinta.  04/2017: DES to RCA.  . DORSAL SLIT [name] 10/29/2019   for severe phimosis. Procedure: DORSAL SLIT;  Surgeon: Alexis Frock, MD;  Location: [facility];  Service: Urology;  Laterality: [name];  28 MINS  . EYE SURGERY Bilateral cataract   . HEMORRHOID SURGERY    . INTRAOCULAR LENS INSERTION Bilateral   . KNEE SURGERY Right   . LEFT AND RIGHT HEART CATHETERIZATION WITH CORONARY ANGIOGRAM [name] 12/24/2012   Procedure: LEFT AND RIGHT HEART CATHETERIZATION WITH CORONARY ANGIOGRAM;  Surgeon: [name], MD;  Location: [facility];  Service: Cardiovascular;  Laterality: [name];  . LEG SURGERY Bilateral    lenghtening   . PACEMAKER PLACEMENT  04/13/2016   2nd deg HB after TAVR, pt had DC MDT PPM placed.  [name] SHOULDER ARTHROSCOPY  08/30/2011   Procedure: ARTHROSCOPY SHOULDER;  Surgeon: [name], MD;  Location: [place];  Service: Orthopedics;  Laterality: Right;  Right Shoulder Arthroscopy, Debridement, and Decompression  . SPINAL CORD STIMULATOR INSERTION [name] 09/10/2015   Procedure: LUMBAR SPINAL CORD STIMULATOR INSERTION;  Surgeon: [name], MD;  Location: [place] [facility];  Service: Neurosurgery;  Laterality: [name];  . TEE WITHOUT CARDIOVERSION [name] 09/16/2019   Procedure: TRANSESOPHAGEAL ECHOCARDIOGRAM (TEE);  Surgeon: Skeet Latch, MD;  Location: [place];  Service: Cardiovascular;  Laterality: [name];  . 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  R big toe surg due to osteoarth  . TONSILLECTOMY    . traeculectomy Left    eye  . TRANSCATHETER AORTIC VALVE REPLACEMENT, TRANSFEMORAL  04/11/2016  . TRANSESOPHAGEAL ECHOCARDIOGRAM  03/09/2016; 09/2019   [facility]: EF 55-60%, PFO seen with bi-directional shunting, no thrombus in appendage.  09/2019 ->no valvular vegetations. Small patent foramen ovale with predominantly left to right shunting across the interatrial septum.  . TRANSTHORACIC ECHOCARDIOGRAM  01/2015; 01/2016; 05/18/16; 09/18/16, 05/2017, 08/2017   01/2015 No signif change in aortic stenosis (moderate).  01/2016 Severe LVH w/small LV cavity, EF 60-65%, grade I diast dysfxn.  05/2016 (s/p TAVR): EF 50-55%, grd I DD, biopros AV good.  08/2016--EF 50-55%, LV septal motion c/w conduction abnl, grd I DD,mild MS,bioprosth aortic valve  well seated, w/trace AR. 05/2017 TTE EF 35%. 08/2017-EF 35%, mod diff hypokin LV, grd I DD, biopros AV good.   . TRANSTHORACIC ECHOCARDIOGRAM  04/2018; 09/2019   04/2018: EF 40-45%, mod diffuse LV hypokinesis, grd I DD, bioprosth AV well seated, no AS or AR. 09/2019 EF 60-65%, grd I DD, valves fine, including bioprosth AV  . VITRECTOMY      Family History  Problem Relation Age of Onset  . Hypertension Mother   .  Coronary artery disease Mother   . Heart attack Mother   . Neuropathy Mother   . Pulmonary fibrosis Father        asbestosis      Social History   Socioeconomic History  . Marital status: Married    Spouse name: Not on file  . Number of children: 0  . Years of education: 43  . Highest education level: Not on file  Occupational History  . Occupation: Engineer, production    Comment: retired  Tobacco Use  . Smoking status: Never Smoker  . Smokeless tobacco: Never Used  Substance and Sexual Activity  . Alcohol use: No    Alcohol/week: 0.0 standard drinks  . Drug use: No    Types: Oxycodone  . Sexual activity: Not Currently  Other Topics Concern  . Not on file  Social History Narrative   HSG, [name] - BS, [name] - MS-engineering, 2 years on PhD - [employer]. Married - '65 - 59yr/divorced; '76- 3 yrs/divorced; '92 . No children. 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 ACP/Living Will - Yes CPR; long-term Mechanical ventilation as long as he was able to cognate; ok for long term artificial nutrition. Precondition being able to cognate and not to have too much pain.    Social Determinants of Health   Financial Resource Strain:   . Difficulty of Paying Living Expenses:   Food Insecurity:   . Worried About RCharity fundraiserin the Last Year:   . RArboriculturistin the Last Year:   Transportation Needs:   . LFilm/video editor(Medical):   .Marland KitchenLack of Transportation (Non-Medical):   Physical Activity:   . Days of Exercise per  Week:   . Minutes of Exercise per Session:   Stress:   . Feeling of Stress :   Social Connections:   . Frequency of Communication with Friends and Family:   . Frequency of Social Gatherings with Friends and Family:   . Attends Religious Services:   . Active Member of Clubs or Organizations:   . Attends CArchivistMeetings:   .Marland KitchenMarital Status:     Allergies  Allergen Reactions  . Brimonidine Tartrate Shortness Of Breath    Alphagan-Shortness of breath  . Brimonidine Tartrate Palpitations  . Brinzolamide Shortness Of Breath    AZOPT- Shortness of breath  . Latanoprost Shortness Of Breath    XALATAN- Shortness of breath  . Nucynta [Tapentadol] Shortness Of Breath  . Sulfa Antibiotics Palpitations  . Timolol Maleate Shortness Of Breath and Other (See Comments)    TIMOPTIC- Aggravated asthma  . Cardizem  [Diltiazem Hcl] Swelling  . Diltiazem Swelling     leg swelling  . Rofecoxib Swelling     VIOXX- leg swelling  . Vancomycin Hives and Other (See Comments)    Possible "Red Man Syndrome"? > hives/blisters  . Codeine Other (See Comments)    Childhood reaction  . Tamsulosin Other (See Comments)    Dizziness   . Celecoxib Other (See Comments)    CELLBREX-confusion  . Colchicine Diarrhea    diarrhea  . Tape Rash     Current Outpatient Medications:  .  albuterol (PROVENTIL HFA;VENTOLIN HFA) 108 (90 BASE) MCG/ACT inhaler, Inhale 2 puffs into the lungs 4 (four) times daily as needed for wheezing or shortness of breath., Disp: , Rfl:  .  allopurinol (ZYLOPRIM) 300 MG tablet, TAKE 1 TABLET BY MOUTH ONCE DAILY  WITH FOOD (Patient taking differently: Take 300 mg by mouth daily. ), Disp: 90 tablet, Rfl: 1 .  ASPIRIN LOW DOSE 81 MG EC tablet, Take 81 mg by mouth daily., Disp: , Rfl:  .  B Complex-C (B-COMPLEX WITH VITAMIN C) tablet, Take 1 tablet by mouth daily. Take 1 tablet daily, 211m, Disp: , Rfl:  .  doxycycline (VIBRA-TABS) 100 MG tablet, TAKE 1 TABLET BY MOUTH TWICE A  DAY, Disp: 60 tablet, Rfl: 0 .  ezetimibe (ZETIA) 10 MG tablet, take 1 tablet by mouth once daily (Patient taking differently: Take 10 mg by mouth daily. ), Disp: 90 tablet, Rfl: 3 .  finasteride (PROSCAR) 5 MG tablet, TAKE 1 TABLET BY MOUTH EVERY DAY (Patient taking differently: Take 5 mg by mouth daily. ), Disp: 90 tablet, Rfl: 0 .  fluticasone (FLONASE) 50 MCG/ACT nasal spray, Place 2 sprays into both nostrils as needed for allergies or rhinitis., Disp: , Rfl:  .  Fluticasone-Salmeterol (ADVAIR) 250-50 MCG/DOSE AEPB, Inhale 1 puff into the lungs 2 (two) times daily., Disp: , Rfl:  .  gabapentin (NEURONTIN) 800 MG tablet, TAKE 1 TABLET (800 MG TOTAL) BY MOUTH 4 (FOUR) TIMES DAILY., Disp: 360 tablet, Rfl: 2 .  isosorbide mononitrate (IMDUR) 60 MG 24 hr tablet, TAKE 1 TABLET BY MOUTH EVERY DAY (Patient taking differently: Take 30 mg by mouth daily. ), Disp: 90 tablet, Rfl: 3 .  metoprolol succinate (TOPROL-XL) 50 MG 24 hr tablet, Take 50 mg by mouth daily. Take along with the 253mtablet to make total dose of 7553mDisp: , Rfl:  .  Multiple Vitamin (MULTIVITAMIN) capsule, Take 1 capsule by mouth daily., Disp: , Rfl:  .  niacin (NIASPAN) 1000 MG CR tablet, TAKE 1 TABLET BY MOUTH TWICE A DAY (Patient taking differently: Take 1,000 mg by mouth 2 (two) times daily. ), Disp: 180 tablet, Rfl: 1 .  nitroGLYCERIN (NITROSTAT) 0.4 MG SL tablet, Place 0.4 mg under the tongue every 5 (five) minutes as needed for chest pain. , Disp: , Rfl:  .  omeprazole (PRILOSEC) 10 MG capsule, TAKE 1 CAPSULE BY MOUTH EVERY DAY, Disp: 90 capsule, Rfl: 0 .  oxyCODONE-acetaminophen (PERCOCET) 5-325 MG tablet, Take 1 tablet by mouth every 4 (four) hours as needed for moderate pain or severe pain. Post-operatively, Disp: 20 tablet, Rfl: 0 .  rosuvastatin (CRESTOR) 40 MG tablet, take 1 tablet by mouth once daily (Patient taking differently: Take 40 mg by mouth daily. ), Disp: 15 tablet, Rfl: 0 .  sacubitril-valsartan (ENTRESTO) 24-26  MG, Take 0.5 tablets by mouth 2 (two) times daily. , Disp: , Rfl:  .  senna-docusate (SENOKOT-S) 8.6-50 MG tablet, Take 1 tablet by mouth 2 (two) times daily. While taking strong pain meds to prevent constipation., Disp: 10 tablet, Rfl: 0 .  torsemide (DEMADEX) 20 MG tablet, Take 20 mg by mouth daily as needed (swelling)., Disp: , Rfl:  .  vitamin C (ASCORBIC ACID) 250 MG tablet, Take 250 mg by mouth daily., Disp: , Rfl:  .  VITAMIN D, CHOLECALCIFEROL, PO, Take 1 tablet by mouth daily. , Disp: , Rfl:    Review of Systems  Constitutional: Positive for activity change, chills, fatigue and fever. Negative for appetite change, diaphoresis and unexpected weight change.  HENT: Negative for congestion, rhinorrhea, sinus pressure, sneezing, sore throat and trouble swallowing.   Eyes: Negative for photophobia and visual disturbance.  Respiratory: Negative for cough, chest tightness, shortness of breath, wheezing and stridor.   Cardiovascular: Negative for chest  pain, palpitations and leg swelling.  Gastrointestinal: Negative for abdominal distention, abdominal pain, anal bleeding, blood in stool, constipation, diarrhea, nausea and vomiting.  Genitourinary: Negative for difficulty urinating, dysuria, flank pain and hematuria.  Musculoskeletal: Negative for arthralgias, back pain, gait problem, joint swelling and myalgias.  Skin: Positive for color change and wound. Negative for pallor and rash.  Neurological: Positive for dizziness, weakness and light-headedness. Negative for tremors.  Hematological: Negative for adenopathy. Does not bruise/bleed easily.  Psychiatric/Behavioral: Negative for agitation, behavioral problems, confusion, decreased concentration, dysphoric mood and sleep disturbance.       Objective:   Physical Exam Constitutional:      General: He is not in acute distress.    Appearance: Normal appearance. He is well-developed. He is not ill-appearing or diaphoretic.  HENT:     Head:  Normocephalic and atraumatic.     Right Ear: Hearing and external ear normal.     Left Ear: Hearing and external ear normal.     Nose: No nasal deformity or rhinorrhea.  Eyes:     General: No scleral icterus.    Conjunctiva/sclera: Conjunctivae normal.     Right eye: Right conjunctiva is not injected.     Left eye: Left conjunctiva is not injected.     Pupils: Pupils are equal, round, and reactive to light.  Neck:     Vascular: No JVD.  Cardiovascular:     Rate and Rhythm: Normal rate and regular rhythm.     Heart sounds: Normal heart sounds, S1 normal and S2 normal. No murmur. No friction rub. No gallop.   Pulmonary:     Effort: Pulmonary effort is normal. No respiratory distress.     Breath sounds: Normal breath sounds. No stridor. No wheezing.  Abdominal:     General: Bowel sounds are normal. There is no distension.     Palpations: Abdomen is soft.  Musculoskeletal:        General: Normal range of motion.     Right shoulder: Normal.     Left shoulder: Normal.     Cervical back: Normal range of motion and neck supple.     Right hip: Normal.     Left hip: Normal.     Right knee: Normal.     Left knee: Normal.  Lymphadenopathy:     Head:     Right side of head: No submandibular, preauricular or posterior auricular adenopathy.     Left side of head: No submandibular, preauricular or posterior auricular adenopathy.     Cervical: No cervical adenopathy.     Right cervical: No superficial or deep cervical adenopathy.    Left cervical: No superficial or deep cervical adenopathy.  Skin:    General: Skin is warm and dry.     Coloration: Skin is not pale.     Findings: No abrasion, bruising, ecchymosis, erythema, lesion or rash.     Nails: There is no clubbing.  Neurological:     General: No focal deficit present.     Mental Status: He is alert and oriented to person, place, and time.     Sensory: No sensory deficit.     Coordination: Coordination normal.     Gait: Gait normal.   Psychiatric:        Attention and Perception: Attention normal. He is attentive.        Mood and Affect: Mood is anxious.        Speech: Speech normal.        Behavior:  Behavior normal. Behavior is cooperative.        Thought Content: Thought content normal.        Judgment: Judgment normal.     Amputation site while he was in the hospital in March:     Amputation site October 22, 2019:     On May 5th 2021:     11/26/2019:    Left  Foot 11/26/2019:           Assessment & Plan:   Fevers, dizziness: I am VERY concerned that he has a systemic infection that the zyvox was keeping at bay  I did not now about his foreskin site or I would have  Examined this today.  I counseled him to be admitted tot he hospital today to hasten workup and allow [name] to give him  IVF.  I  Am getting stat CBC w diff, BMP and blood cultures along with ESR, CRP  I am trying to get MRI of left foot tomorrow  I have firmly counseled him to  Come back to ER if worsening.  I dont think he has much margin to worsen with all anti HTNsives being eliminated  (he will stop imdur)  I  Wrote rx for augmentin for him to start when he gets home to  Give him some antibiotics while cultures are incubating  I am VERY worried about him.    Enterococcal bacteremia: We will check blood cultures today  History of ICD and prosthetic valves: Is paramount that he has control of infection to prevent seeding of his device and his prosthetic valves. His desire to not be hospitalized right now is shaping much of his care though he agrees to  Be hospitalized if I take steps I am taking including ordering MRI  Amputation site: The appearance is reassuring but his clinical picture is not  And I  Am concerned for deep  Infection. MRI ordered  Foreskin surgery: need to have this site examined

## 2019-11-27 ENCOUNTER — Other Ambulatory Visit: Payer: Self-pay

## 2019-11-27 ENCOUNTER — Inpatient Hospital Stay (HOSPITAL_COMMUNITY)
Admission: EM | Admit: 2019-11-27 | Discharge: 2019-12-07 | DRG: 871 | Disposition: A | Discharge status: Home Health | Payer: Medicare Other | Attending: Internal Medicine | Admitting: Internal Medicine

## 2019-11-27 ENCOUNTER — Telehealth: Payer: Self-pay | Admitting: *Deleted

## 2019-11-27 ENCOUNTER — Inpatient Hospital Stay (HOSPITAL_COMMUNITY): Payer: Medicare Other

## 2019-11-27 DIAGNOSIS — R52 Pain, unspecified: Secondary | ICD-10-CM

## 2019-11-27 DIAGNOSIS — G894 Chronic pain syndrome: Secondary | ICD-10-CM | POA: Diagnosis present

## 2019-11-27 DIAGNOSIS — Z66 Do not resuscitate: Secondary | ICD-10-CM | POA: Diagnosis present

## 2019-11-27 DIAGNOSIS — E876 Hypokalemia: Secondary | ICD-10-CM | POA: Diagnosis present

## 2019-11-27 DIAGNOSIS — Z8701 Personal history of pneumonia (recurrent): Secondary | ICD-10-CM

## 2019-11-27 DIAGNOSIS — G608 Other hereditary and idiopathic neuropathies: Secondary | ICD-10-CM | POA: Diagnosis present

## 2019-11-27 DIAGNOSIS — M109 Gout, unspecified: Secondary | ICD-10-CM | POA: Diagnosis present

## 2019-11-27 DIAGNOSIS — T827XXA Infection and inflammatory reaction due to other cardiac and vascular devices, implants and grafts, initial encounter: Secondary | ICD-10-CM | POA: Diagnosis not present

## 2019-11-27 DIAGNOSIS — G609 Hereditary and idiopathic neuropathy, unspecified: Secondary | ICD-10-CM | POA: Diagnosis present

## 2019-11-27 DIAGNOSIS — R7881 Bacteremia: Secondary | ICD-10-CM | POA: Diagnosis present

## 2019-11-27 DIAGNOSIS — J449 Chronic obstructive pulmonary disease, unspecified: Secondary | ICD-10-CM | POA: Diagnosis present

## 2019-11-27 DIAGNOSIS — E1169 Type 2 diabetes mellitus with other specified complication: Secondary | ICD-10-CM | POA: Diagnosis present

## 2019-11-27 DIAGNOSIS — T826XXA Infection and inflammatory reaction due to cardiac valve prosthesis, initial encounter: Secondary | ICD-10-CM | POA: Diagnosis not present

## 2019-11-27 DIAGNOSIS — Z87440 Personal history of urinary (tract) infections: Secondary | ICD-10-CM

## 2019-11-27 DIAGNOSIS — R509 Fever, unspecified: Secondary | ICD-10-CM | POA: Diagnosis present

## 2019-11-27 DIAGNOSIS — I330 Acute and subacute infective endocarditis: Secondary | ICD-10-CM | POA: Diagnosis present

## 2019-11-27 DIAGNOSIS — Z9109 Other allergy status, other than to drugs and biological substances: Secondary | ICD-10-CM

## 2019-11-27 DIAGNOSIS — E669 Obesity, unspecified: Secondary | ICD-10-CM | POA: Diagnosis present

## 2019-11-27 DIAGNOSIS — I10 Essential (primary) hypertension: Secondary | ICD-10-CM | POA: Diagnosis not present

## 2019-11-27 DIAGNOSIS — Z882 Allergy status to sulfonamides status: Secondary | ICD-10-CM

## 2019-11-27 DIAGNOSIS — Z8711 Personal history of peptic ulcer disease: Secondary | ICD-10-CM

## 2019-11-27 DIAGNOSIS — B952 Enterococcus as the cause of diseases classified elsewhere: Secondary | ICD-10-CM | POA: Diagnosis present

## 2019-11-27 DIAGNOSIS — E662 Morbid (severe) obesity with alveolar hypoventilation: Secondary | ICD-10-CM | POA: Diagnosis not present

## 2019-11-27 DIAGNOSIS — Z79899 Other long term (current) drug therapy: Secondary | ICD-10-CM

## 2019-11-27 DIAGNOSIS — M1A9XX0 Chronic gout, unspecified, without tophus (tophi): Secondary | ICD-10-CM | POA: Diagnosis not present

## 2019-11-27 DIAGNOSIS — A4181 Sepsis due to Enterococcus: Principal | ICD-10-CM | POA: Diagnosis present

## 2019-11-27 DIAGNOSIS — Z20822 Contact with and (suspected) exposure to covid-19: Secondary | ICD-10-CM | POA: Diagnosis not present

## 2019-11-27 DIAGNOSIS — F419 Anxiety disorder, unspecified: Secondary | ICD-10-CM | POA: Diagnosis present

## 2019-11-27 DIAGNOSIS — I110 Hypertensive heart disease with heart failure: Secondary | ICD-10-CM | POA: Diagnosis not present

## 2019-11-27 DIAGNOSIS — E291 Testicular hypofunction: Secondary | ICD-10-CM | POA: Diagnosis present

## 2019-11-27 DIAGNOSIS — Z950 Presence of cardiac pacemaker: Secondary | ICD-10-CM

## 2019-11-27 DIAGNOSIS — I272 Pulmonary hypertension, unspecified: Secondary | ICD-10-CM | POA: Diagnosis present

## 2019-11-27 DIAGNOSIS — Z952 Presence of prosthetic heart valve: Secondary | ICD-10-CM | POA: Diagnosis not present

## 2019-11-27 DIAGNOSIS — I340 Nonrheumatic mitral (valve) insufficiency: Secondary | ICD-10-CM | POA: Diagnosis not present

## 2019-11-27 DIAGNOSIS — Q211 Atrial septal defect: Secondary | ICD-10-CM

## 2019-11-27 DIAGNOSIS — Q5564 Hidden penis: Secondary | ICD-10-CM

## 2019-11-27 DIAGNOSIS — I5042 Chronic combined systolic (congestive) and diastolic (congestive) heart failure: Secondary | ICD-10-CM | POA: Diagnosis not present

## 2019-11-27 DIAGNOSIS — Z7982 Long term (current) use of aspirin: Secondary | ICD-10-CM

## 2019-11-27 DIAGNOSIS — I480 Paroxysmal atrial fibrillation: Secondary | ICD-10-CM | POA: Diagnosis present

## 2019-11-27 DIAGNOSIS — Z8249 Family history of ischemic heart disease and other diseases of the circulatory system: Secondary | ICD-10-CM

## 2019-11-27 DIAGNOSIS — Z885 Allergy status to narcotic agent status: Secondary | ICD-10-CM

## 2019-11-27 DIAGNOSIS — I350 Nonrheumatic aortic (valve) stenosis: Secondary | ICD-10-CM | POA: Diagnosis present

## 2019-11-27 DIAGNOSIS — G8321 Monoplegia of upper limb affecting right dominant side: Secondary | ICD-10-CM | POA: Diagnosis not present

## 2019-11-27 DIAGNOSIS — I2510 Atherosclerotic heart disease of native coronary artery without angina pectoris: Secondary | ICD-10-CM | POA: Diagnosis present

## 2019-11-27 DIAGNOSIS — K760 Fatty (change of) liver, not elsewhere classified: Secondary | ICD-10-CM | POA: Diagnosis present

## 2019-11-27 DIAGNOSIS — N400 Enlarged prostate without lower urinary tract symptoms: Secondary | ICD-10-CM | POA: Diagnosis present

## 2019-11-27 DIAGNOSIS — M79609 Pain in unspecified limb: Secondary | ICD-10-CM | POA: Diagnosis not present

## 2019-11-27 DIAGNOSIS — Z95810 Presence of automatic (implantable) cardiac defibrillator: Secondary | ICD-10-CM | POA: Diagnosis not present

## 2019-11-27 DIAGNOSIS — Z888 Allergy status to other drugs, medicaments and biological substances status: Secondary | ICD-10-CM

## 2019-11-27 DIAGNOSIS — Z6841 Body Mass Index (BMI) 40.0 and over, adult: Secondary | ICD-10-CM

## 2019-11-27 DIAGNOSIS — I339 Acute and subacute endocarditis, unspecified: Secondary | ICD-10-CM | POA: Diagnosis not present

## 2019-11-27 DIAGNOSIS — M170 Bilateral primary osteoarthritis of knee: Secondary | ICD-10-CM | POA: Diagnosis present

## 2019-11-27 DIAGNOSIS — Z881 Allergy status to other antibiotic agents status: Secondary | ICD-10-CM

## 2019-11-27 DIAGNOSIS — Y831 Surgical operation with implant of artificial internal device as the cause of abnormal reaction of the patient, or of later complication, without mention of misadventure at the time of the procedure: Secondary | ICD-10-CM | POA: Diagnosis present

## 2019-11-27 DIAGNOSIS — I495 Sick sinus syndrome: Secondary | ICD-10-CM | POA: Diagnosis present

## 2019-11-27 DIAGNOSIS — I38 Endocarditis, valve unspecified: Secondary | ICD-10-CM | POA: Diagnosis not present

## 2019-11-27 DIAGNOSIS — Z7951 Long term (current) use of inhaled steroids: Secondary | ICD-10-CM

## 2019-11-27 DIAGNOSIS — R5381 Other malaise: Secondary | ICD-10-CM | POA: Diagnosis present

## 2019-11-27 DIAGNOSIS — Z8709 Personal history of other diseases of the respiratory system: Secondary | ICD-10-CM

## 2019-11-27 DIAGNOSIS — Z955 Presence of coronary angioplasty implant and graft: Secondary | ICD-10-CM

## 2019-11-27 DIAGNOSIS — E785 Hyperlipidemia, unspecified: Secondary | ICD-10-CM | POA: Diagnosis present

## 2019-11-27 DIAGNOSIS — I255 Ischemic cardiomyopathy: Secondary | ICD-10-CM | POA: Diagnosis present

## 2019-11-27 DIAGNOSIS — R109 Unspecified abdominal pain: Secondary | ICD-10-CM

## 2019-11-27 DIAGNOSIS — Z9049 Acquired absence of other specified parts of digestive tract: Secondary | ICD-10-CM

## 2019-11-27 DIAGNOSIS — H409 Unspecified glaucoma: Secondary | ICD-10-CM | POA: Diagnosis present

## 2019-11-27 DIAGNOSIS — F329 Major depressive disorder, single episode, unspecified: Secondary | ICD-10-CM | POA: Diagnosis present

## 2019-11-27 DIAGNOSIS — I252 Old myocardial infarction: Secondary | ICD-10-CM

## 2019-11-27 DIAGNOSIS — Z886 Allergy status to analgesic agent status: Secondary | ICD-10-CM

## 2019-11-27 DIAGNOSIS — Z89432 Acquired absence of left foot: Secondary | ICD-10-CM

## 2019-11-27 DIAGNOSIS — M7989 Other specified soft tissue disorders: Secondary | ICD-10-CM | POA: Diagnosis not present

## 2019-11-27 DIAGNOSIS — K219 Gastro-esophageal reflux disease without esophagitis: Secondary | ICD-10-CM | POA: Diagnosis present

## 2019-11-27 LAB — COMPREHENSIVE METABOLIC PANEL
ALT: 31 U/L (ref 0–44)
AST: 34 U/L (ref 15–41)
Albumin: 2.9 g/dL — ABNORMAL LOW (ref 3.5–5.0)
Alkaline Phosphatase: 67 U/L (ref 38–126)
Anion gap: 13 (ref 5–15)
BUN: 11 mg/dL (ref 8–23)
CO2: 21 mmol/L — ABNORMAL LOW (ref 22–32)
Calcium: 8.9 mg/dL (ref 8.9–10.3)
Chloride: 103 mmol/L (ref 98–111)
Creatinine, Ser: 0.88 mg/dL (ref 0.61–1.24)
GFR calc Af Amer: >60 mL/min (ref >60)
GFR calc non Af Amer: >60 mL/min (ref >60)
Glucose, Bld: 139 mg/dL — ABNORMAL HIGH (ref 70–99)
Potassium: 4 mmol/L (ref 3.5–5.1)
Sodium: 137 mmol/L (ref 135–145)
Total Bilirubin: 0.9 mg/dL (ref 0.3–1.2)
Total Protein: 6.7 g/dL (ref 6.5–8.1)

## 2019-11-27 LAB — CBC WITH DIFFERENTIAL/PLATELET
Abs Immature Granulocytes: 0.03 10*3/uL (ref 0.00–0.07)
Basophils Absolute: 0 10*3/uL (ref 0.0–0.1)
Basophils Relative: 0 %
Eosinophils Absolute: 0 10*3/uL (ref 0.0–0.5)
Eosinophils Relative: 0 %
HCT: 35.6 % — ABNORMAL LOW (ref 39.0–52.0)
Hemoglobin: 11.3 g/dL — ABNORMAL LOW (ref 13.0–17.0)
Immature Granulocytes: 0 %
Lymphocytes Relative: 9 %
Lymphs Abs: 0.9 10*3/uL (ref 0.7–4.0)
MCH: 28.8 pg (ref 26.0–34.0)
MCHC: 31.7 g/dL (ref 30.0–36.0)
MCV: 90.8 fL (ref 80.0–100.0)
Monocytes Absolute: 1 10*3/uL (ref 0.1–1.0)
Monocytes Relative: 11 %
Neutro Abs: 7.6 10*3/uL (ref 1.7–7.7)
Neutrophils Relative %: 80 %
Platelets: 212 10*3/uL (ref 150–400)
RBC: 3.92 MIL/uL — ABNORMAL LOW (ref 4.22–5.81)
RDW: 15.4 % (ref 11.5–15.5)
WBC: 9.5 10*3/uL (ref 4.0–10.5)
nRBC: 0 % (ref 0.0–0.2)

## 2019-11-27 LAB — SARS CORONAVIRUS 2 BY RT PCR (HOSPITAL ORDER, PERFORMED IN ~~LOC~~ HOSPITAL LAB): SARS Coronavirus 2: NEGATIVE

## 2019-11-27 LAB — LACTIC ACID, PLASMA: Lactic Acid, Venous: 0.9 mmol/L (ref 0.5–1.9)

## 2019-11-27 MED ORDER — OXYCODONE-ACETAMINOPHEN 5-325 MG PO TABS: 1.0000 | ORAL_TABLET | ORAL | Status: DC | PRN

## 2019-11-27 MED ORDER — ENOXAPARIN SODIUM 40 MG/0.4ML ~~LOC~~ SOLN
40.0000 mg | SUBCUTANEOUS | Status: DC
  Administered 2019-11-27: 40 mg via SUBCUTANEOUS
  Filled 2019-11-27: qty 0.4

## 2019-11-27 MED ORDER — ACETAMINOPHEN 325 MG PO TABS
650.0000 mg | ORAL_TABLET | Freq: Four times a day (QID) | ORAL | Status: DC | PRN
  Administered 2019-12-04 – 2019-12-05 (×2): 650 mg via ORAL
  Filled 2019-11-27 (×2): qty 2

## 2019-11-27 MED ORDER — NIACIN ER (ANTIHYPERLIPIDEMIC) 500 MG PO TBCR
1000.0000 mg | EXTENDED_RELEASE_TABLET | Freq: Two times a day (BID) | ORAL | Status: DC
  Administered 2019-11-28 – 2019-12-07 (×20): 1000 mg via ORAL
  Filled 2019-11-27 (×23): qty 2

## 2019-11-27 MED ORDER — SODIUM CHLORIDE 0.9 % IV SOLN
1000.0000 mg | Freq: Every day | INTRAVENOUS | Status: DC
  Administered 2019-11-27: 1000 mg via INTRAVENOUS
  Filled 2019-11-27 (×2): qty 20

## 2019-11-27 MED ORDER — PANTOPRAZOLE SODIUM 40 MG PO TBEC
40.0000 mg | DELAYED_RELEASE_TABLET | Freq: Every day | ORAL | Status: DC
  Administered 2019-11-27 – 2019-12-07 (×11): 40 mg via ORAL
  Filled 2019-11-27 (×11): qty 1

## 2019-11-27 MED ORDER — ACETAMINOPHEN 650 MG RE SUPP: 650.0000 mg | Freq: Four times a day (QID) | RECTAL | Status: DC | PRN

## 2019-11-27 MED ORDER — SENNOSIDES-DOCUSATE SODIUM 8.6-50 MG PO TABS
1.0000 | ORAL_TABLET | Freq: Two times a day (BID) | ORAL | Status: DC
  Administered 2019-11-27 – 2019-12-07 (×15): 1 via ORAL
  Filled 2019-11-27 (×21): qty 1

## 2019-11-27 MED ORDER — GABAPENTIN 300 MG PO CAPS
600.0000 mg | ORAL_CAPSULE | Freq: Four times a day (QID) | ORAL | Status: DC
  Administered 2019-11-27: 600 mg via ORAL
  Filled 2019-11-27 (×3): qty 2

## 2019-11-27 MED ORDER — SODIUM CHLORIDE 0.9 % IV SOLN
8.0000 mg/kg | Freq: Every day | INTRAVENOUS | Status: DC
  Filled 2019-11-27: qty 16.67

## 2019-11-27 MED ORDER — ALBUTEROL SULFATE (2.5 MG/3ML) 0.083% IN NEBU: 2.5000 mg | INHALATION_SOLUTION | RESPIRATORY_TRACT | Status: DC | PRN

## 2019-11-27 MED ORDER — SACUBITRIL-VALSARTAN 24-26 MG PO TABS
0.5000 | ORAL_TABLET | Freq: Two times a day (BID) | ORAL | Status: DC
  Administered 2019-11-28 – 2019-12-07 (×19): 0.5 via ORAL
  Filled 2019-11-27 (×22): qty 0.5

## 2019-11-27 MED ORDER — ONDANSETRON HCL 4 MG/2ML IJ SOLN: 4.0000 mg | Freq: Four times a day (QID) | INTRAMUSCULAR | Status: DC | PRN

## 2019-11-27 MED ORDER — NITROGLYCERIN 0.4 MG SL SUBL: 0.4000 mg | SUBLINGUAL_TABLET | SUBLINGUAL | Status: DC | PRN

## 2019-11-27 MED ORDER — METOPROLOL SUCCINATE ER 25 MG PO TB24
50.0000 mg | ORAL_TABLET | Freq: Every day | ORAL | Status: DC
  Administered 2019-11-28 – 2019-11-30 (×3): 50 mg via ORAL
  Filled 2019-11-27 (×3): qty 2

## 2019-11-27 MED ORDER — EZETIMIBE 10 MG PO TABS
10.0000 mg | ORAL_TABLET | Freq: Every day | ORAL | Status: DC
  Administered 2019-11-28 – 2019-12-07 (×10): 10 mg via ORAL
  Filled 2019-11-27 (×10): qty 1

## 2019-11-27 MED ORDER — FINASTERIDE 5 MG PO TABS
5.0000 mg | ORAL_TABLET | Freq: Every day | ORAL | Status: DC
  Administered 2019-11-28 – 2019-11-30 (×3): 5 mg via ORAL
  Filled 2019-11-27 (×3): qty 1

## 2019-11-27 MED ORDER — ALLOPURINOL 300 MG PO TABS
300.0000 mg | ORAL_TABLET | Freq: Every day | ORAL | Status: DC
  Administered 2019-11-28 – 2019-12-07 (×10): 300 mg via ORAL
  Filled 2019-11-27 (×10): qty 1

## 2019-11-27 MED ORDER — ALBUTEROL SULFATE HFA 108 (90 BASE) MCG/ACT IN AERS
2.0000 | INHALATION_SPRAY | Freq: Four times a day (QID) | RESPIRATORY_TRACT | Status: DC | PRN
  Filled 2019-11-27: qty 6.7

## 2019-11-27 MED ORDER — ONDANSETRON HCL 4 MG PO TABS: 4.0000 mg | ORAL_TABLET | Freq: Four times a day (QID) | ORAL | Status: DC | PRN

## 2019-11-27 MED ORDER — ROSUVASTATIN CALCIUM 20 MG PO TABS
40.0000 mg | ORAL_TABLET | Freq: Every day | ORAL | Status: DC
  Administered 2019-11-27 – 2019-12-07 (×11): 40 mg via ORAL
  Filled 2019-11-27 (×11): qty 2

## 2019-11-27 MED ORDER — ASPIRIN EC 81 MG PO TBEC
81.0000 mg | DELAYED_RELEASE_TABLET | Freq: Every day | ORAL | Status: DC
  Administered 2019-11-27 – 2019-12-07 (×11): 81 mg via ORAL
  Filled 2019-11-27 (×11): qty 1

## 2019-11-27 MED ORDER — GABAPENTIN 600 MG PO TABS
600.0000 mg | ORAL_TABLET | Freq: Four times a day (QID) | ORAL | Status: DC
  Administered 2019-11-28 (×2): 600 mg via ORAL
  Filled 2019-11-27 (×2): qty 1

## 2019-11-27 MED ORDER — MOMETASONE FURO-FORMOTEROL FUM 200-5 MCG/ACT IN AERO
2.0000 | INHALATION_SPRAY | Freq: Two times a day (BID) | RESPIRATORY_TRACT | Status: DC
  Administered 2019-11-29 – 2019-12-07 (×15): 2 via RESPIRATORY_TRACT
  Filled 2019-11-27 (×3): qty 8.8

## 2019-11-27 MED ORDER — FLUTICASONE PROPIONATE 50 MCG/ACT NA SUSP
2.0000 | Freq: Every day | NASAL | Status: DC | PRN
  Filled 2019-11-27: qty 16

## 2019-11-27 NOTE — ED Notes (Signed)
Per Pt; pt came POV after PCP called him stating he needed to come in for antibiotic therapy for positive blood cultures completed yesterday (11/26/2019).   VSS. No CP, SOB, or Pain. NAD.

## 2019-11-27 NOTE — H&P (Signed)
History and Physical    Tim [name] KDT:[id number] DOB: 1940/10/20 DOA: 11/27/2019  Referring MD/NP/PA: EDP PCP:  Patient coming from: Home  Chief Complaint: Weakness, fever and positive blood culture  HPI: Tim [name] is a 79 y.o. 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 -Subsequently underwent dorsal slit procedure by Dr. Tresa Moore on 4/28 for severe phimosis with buried penis, had a Foley catheter thereafter, Foley catheter was removed about a week ago in urology clinic. -He is chronically relatively debilitated and spends most of his time in a recliner, is able to ambulate a little using a walker at home. -He reports weakness dizziness and fevers off and on for the past 2 weeks, temperatures as high as 102, he was seen in the infectious disease clinic yesterday with the symptoms and had blood cultures drawn, he was asked to start oral Augmentin of which she took 2 doses, Dr. Drucilla Schmidt was concerned about the possibility of bacteremia and/or endocarditis. -His blood cultures today grew gram-positive cocci in chains and hence was asked to come back to the emergency room. -He denies any pain swelling or discomfort at his recent amputation site -He was ordered to get repeat blood cultures and given a dose of daptomycin per ID recommendations  Review of Systems: As per HPI otherwise 14 point review  of systems negative.   Past Medical History:  Diagnosis Date  . AICD (automatic cardioverter/defibrillator) present   . ASTHMA   . Asthma    as a child  . Balanitis    +severe phimosis+ buried penis->circ not possible but dorsal slit done 10/2019  . BPH (benign prostatic hypertrophy)    with urinary retention  . CAD (coronary artery disease)    Nonobstructive by 12/2012 cath; then 03/2016 he required BMS to RCA ([facility]).  In-stent restenosis on cath 11/01/16, baloon angioplasty successful--pt to take plavix (no ASA), + eliquis (for PAF).  [name] CATARACT, HX OF   . Chronic combined systolic and diastolic CHF (congestive heart failure) ([place]) 05/2016   Ischemic CM.  04/2018 EF 40-45%, grd I DD.  [name] Chronic pain syndrome    Lumbar DDD; chronic neuropathic pain (DM); has spinal stimulator and sees pain mgmt MD  . Complicated UTI (urinary tract infection) 09/2019   Phimoses, acute urinary retention, entoerococcus UTI, enterococc bacteremia.  F/u blood clx's neg x 5d.    [name] COPD   . Diabetes mellitus type 2 with complications (HCC)    DXI3J jumped from 5.7% to 6.1% 03/2015---started metformin at that time.  DM 2 dx by fasting gluc criteria 2018.  Has chronic neuropath pain  . Elevated transaminase level 02/2016   Suspect due to fatty liver (documented on u/s 2007). Plan repeat labs 03/2016.  [name] Enterococcal bacteremia 09/2019   Phimoses, acute urinary retention, entoerococcus UTI, enterococc bacteremia.  F/u blood clx's neg x 5d.    . Essential hypertension, benign   . Fatty liver 2007   2007 u/s showed  fatty liver with hepatosplenomegaly.  2019 repeat u/s->fatty liver but no cirrhosis or hepatosplenomegaly.  . FUO (fever of unknown origin) 11/26/2019  . GERD (gastroesophageal reflux disease)   . Glaucoma   . GOUT   . Hallux valgus (acquired)   . HH (hiatus hernia)   . HYPERCHOLESTEROLEMIA-PURE   . Hypogonadism male   . Lumbosacral neuritis   . Lumbosacral spondylosis    Lumbar spinal stenosis  with neurogenic claudication--contributes to his chronic pain syndrome  . Normal memory function 08/2014   Neuropsychological testing ([facility]): no cognitive impairment or sign of neurodegenerative disorder.  Likely has adjustment d/o with mixed anxiety/depressed features and may benefit from low dose SNRI.    [name] Normocytic anemia 03/2016   Mild-pt needs ferritin and vit B12 level checked (as of 03/22/16). Hb stable 09/2019  . NSTEMI (non-ST elevated myocardial infarction) ([place]) 03/20/2016   BMS to RCA  . Obesity hypoventilation syndrome ([place])   . Obesity, unspecified   . Orthostatic hypotension   . OSA on CPAP    8 cm H2O  . OSTEOARTHRITIS   . Paroxysmal atrial fibrillation ([place]) 2003    (? chronic?) Off anticoag for a while due to falls.  Then apixaban started 12/2014.  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 Ella Bodo): neuropathic pain->diff to treat, failed nucynta, failed spinal stimul trial, oxycontin hs + tramadol + gabap as of 12/2017 f/u Dr. Letta Pate.  . Personal history of colonic adenoma 10/30/2012   Diminutive adenoma, consider repeat 2019 per GI  . PFO (patent foramen ovale) 09/2019   small, with predominately L to R shunt  . Pneumonia   . Presence of cardiac defibrillator 11/07/2017  . Primary osteoarthritis of both knees    Bone on bone of medial compartments, + signif patellofemoral arth bilat.--supartz inj series started 09/12/17  . PUD (peptic ulcer disease)   . PULMONARY HYPERTENSION, HX OF   . Secondary male hypogonadism 2017  . 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 Shortness of breath    with exertion: much improved s/p TAVR and treatment for CHF.  . Sick sinus syndrome (HCC)    PPM placed  . Thrombocytopenia ([place]) 2018   HSM on 2007 abd u/s---suspect some mild splenic sequestration chronically.  [name] Unspecified glaucoma(365.9)   . Unspecified  hereditary and idiopathic peripheral neuropathy approx age 36   bilat LE's, ? left arm, too.  Feet became progressively numb + left foot pain intermittently.  Pt may be trying a spinal stimulator (as of 05/2015)  . VENOUS INSUFFICIENCY    Being followed by Dr. Sharol Given as of 10/2016 for two R LL venous stasis ulcers/skin tears.  Healed as of 10/30/16 f/u with Dr. Sharol Given.  . VENTRAL HERNIA   . Wound dehiscence 10/22/2019    Past Surgical History:  Procedure Laterality Date  . AMPUTATION Left 04/11/2013   Procedure: AMPUTATION DIGIT Left 3rd toe;  Surgeon: [name], MD;  Location: [place];  Service: Orthopedics;  Laterality: Left;  Left 3rd toe amputation at [facility]  . AMPUTATION Left 08/29/2019   Procedure: LEFT TRANSMETATARSAL AMPUTATION;  Surgeon: [name], MD;  Location: [place];  Service: Orthopedics;  Laterality: Left;  . CARDIAC CATHETERIZATION  1997; 03/10/16   1997 Non-obstructive disease.  03/2016 BMS to RCA, with 25% pDiag dz, o/w normal cors per cath 03/07/16.  Cath 11/01/16: in stent restenosis, successful  baloon angioplasty.  [name] CARDIAC CATHETERIZATION  12/24/2012   mild < 20% LCx, prox 30% RCA; LVEF 55-65% , moderate pulmonary HTN, moderate AS  . CARDIAC DEFIBRILLATOR PLACEMENT  11/07/2017   [facility] MRI Quad CRT defibrillator  . CARDIOVASCULAR STRESS TEST  05/11/16 ([facility])   Myocardial perfusion imaging:  No ischemia; scar in apex, global hypokinesis, EF 36%.  . Carotid dopplers  03/09/2016   [facility]: no hemodynamically significant stenosis on either side.  . CHOLECYSTECTOMY    . COLONOSCOPY    . COLONOSCOPY [name] 10/30/2012   Procedure: COLONOSCOPY;  Surgeon: [name], MD;  Location: [facility];  Service: Endoscopy;  Laterality: [name];  . CORONARY ANGIOPLASTY WITH STENT PLACEMENT  03/2016; 04/2017   2017-[facility]: BMS to RCA-pt was placed on Brilinta.  04/2017: DES to RCA.  . DORSAL SLIT [name] 10/29/2019   for severe phimosis. Procedure: DORSAL SLIT;  Surgeon: Alexis Frock, MD;  Location:  [facility];  Service: Urology;  Laterality: [name];  31 MINS  . EYE SURGERY Bilateral cataract  . HEMORRHOID SURGERY    . INTRAOCULAR LENS INSERTION Bilateral   . KNEE SURGERY Right   . LEFT AND RIGHT HEART CATHETERIZATION WITH CORONARY ANGIOGRAM [name] 12/24/2012   Procedure: LEFT AND RIGHT HEART CATHETERIZATION WITH CORONARY ANGIOGRAM;  Surgeon: [name], MD;  Location: [facility] CATH [facility];  Service: Cardiovascular;  Laterality: [name];  . LEG SURGERY Bilateral    lenghtening   . PACEMAKER PLACEMENT  04/13/2016   2nd deg HB after TAVR, pt had DC MDT PPM placed.  [name] SHOULDER ARTHROSCOPY  08/30/2011   Procedure: ARTHROSCOPY SHOULDER;  Surgeon: [name], MD;  Location: [place];  Service: Orthopedics;  Laterality: Right;  Right Shoulder Arthroscopy, Debridement, and Decompression  . SPINAL CORD STIMULATOR INSERTION [name] 09/10/2015   Procedure: LUMBAR SPINAL CORD STIMULATOR INSERTION;  Surgeon: [name], MD;  Location: [place] [facility];  Service: Neurosurgery;  Laterality: [name];  . TEE WITHOUT CARDIOVERSION [name] 09/16/2019   Procedure: TRANSESOPHAGEAL ECHOCARDIOGRAM (TEE);  Surgeon: Skeet Latch, MD;  Location: [place];  Service: Cardiovascular;  Laterality: [name];  . 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  R big toe surg due to osteoarth  . TONSILLECTOMY    . traeculectomy Left    eye  . TRANSCATHETER AORTIC VALVE REPLACEMENT, TRANSFEMORAL  04/11/2016  . TRANSESOPHAGEAL ECHOCARDIOGRAM  03/09/2016; 09/2019   [facility]: EF 55-60%, PFO seen with bi-directional shunting, no thrombus in appendage.  09/2019 ->no valvular vegetations. Small patent foramen ovale with predominantly left to right shunting across the interatrial septum.  . TRANSTHORACIC ECHOCARDIOGRAM  01/2015; 01/2016; 05/18/16; 09/18/16, 05/2017, 08/2017   01/2015 No signif change in aortic stenosis (moderate).  01/2016 Severe LVH w/small LV cavity, EF 60-65%, grade I diast dysfxn.  05/2016 (s/p TAVR): EF 50-55%, grd I DD, biopros AV good.  08/2016--EF  50-55%, LV septal motion c/w conduction abnl, grd I DD,mild MS,bioprosth aortic valve well seated, w/trace AR. 05/2017 TTE EF 35%. 08/2017-EF 35%, mod diff hypokin LV, grd I DD, biopros AV good.   . TRANSTHORACIC ECHOCARDIOGRAM  04/2018; 09/2019   04/2018: EF 40-45%, mod diffuse LV hypokinesis, grd I DD, bioprosth AV well seated, no AS or AR. 09/2019 EF 60-65%, grd I DD, valves fine, including bioprosth AV  . VITRECTOMY       reports that he has never smoked. He has never used smokeless tobacco. He reports that he does not drink alcohol or use drugs.  Allergies  Allergen Reactions  .  Brimonidine Tartrate Shortness Of Breath    Alphagan-Shortness of breath  . Brimonidine Tartrate Palpitations  . Brinzolamide Shortness Of Breath    AZOPT- Shortness of breath  . Latanoprost Shortness Of Breath    XALATAN- Shortness of breath  . Nucynta [Tapentadol] Shortness Of Breath  . Sulfa Antibiotics Palpitations  . Timolol Maleate Shortness Of Breath and Other (See Comments)    TIMOPTIC- Aggravated asthma  . Cardizem  [Diltiazem Hcl] Swelling  . Diltiazem Swelling     leg swelling  . Rofecoxib Swelling     VIOXX- leg swelling  . Vancomycin Hives and Other (See Comments)    Possible "Red Man Syndrome"? > hives/blisters  . Codeine Other (See Comments)    Childhood reaction  . Tamsulosin Other (See Comments)    Dizziness   . Celecoxib Other (See Comments)    CELLBREX-confusion  . Colchicine Diarrhea    diarrhea  . Tape Rash    Family History  Problem Relation Age of Onset  . Hypertension Mother   . Coronary artery disease Mother   . Heart attack Mother   . Neuropathy Mother   . Pulmonary fibrosis Father        asbestosis     Prior to Admission medications   Medication Sig Start Date End Date Taking? Authorizing Provider  albuterol (PROVENTIL HFA;VENTOLIN HFA) 108 (90 BASE) MCG/ACT inhaler Inhale 2 puffs into the lungs 4 (four) times daily as needed for wheezing or shortness of breath.     [provider]  allopurinol (ZYLOPRIM) 300 MG tablet TAKE 1 TABLET BY MOUTH ONCE DAILY WITH FOOD Patient taking differently: Take 300 mg by mouth daily.  06/12/19   McGowen, Adrian Blackwater, MD  amoxicillin-clavulanate (AUGMENTIN) 875-125 MG tablet Take 1 tablet by mouth 2 (two) times daily. 11/26/19   Truman Hayward, MD  ASPIRIN LOW DOSE 81 MG EC tablet Take 81 mg by mouth daily. 02/03/19   [provider]  B Complex-C (B-COMPLEX WITH VITAMIN C) tablet Take 1 tablet by mouth daily. Take 1 tablet daily, 293m    [provider]  ezetimibe (ZETIA) 10 MG tablet take 1 tablet by mouth once daily Patient taking differently: Take 10 mg by mouth daily.  01/18/17   CLelon Perla MD  finasteride (PROSCAR) 5 MG tablet TAKE 1 TABLET BY MOUTH EVERY DAY Patient taking differently: Take 5 mg by mouth daily.  09/08/19   McGowen, PAdrian Blackwater MD  fluticasone (FLONASE) 50 MCG/ACT nasal spray Place 2 sprays into both nostrils as needed for allergies or rhinitis.    [provider]  Fluticasone-Salmeterol (ADVAIR) 250-50 MCG/DOSE AEPB Inhale 1 puff into the lungs 2 (two) times daily.    [provider]  gabapentin (NEURONTIN) 800 MG tablet TAKE 1 TABLET (800 MG TOTAL) BY MOUTH 4 (FOUR) TIMES DAILY. 04/21/19   Kirsteins, ALuanna Salk MD  isosorbide mononitrate (IMDUR) 60 MG 24 hr tablet TAKE 1 TABLET BY MOUTH EVERY DAY Patient taking differently: Take 30 mg by mouth daily.  08/28/19   McGowen, PAdrian Blackwater MD  metoprolol succinate (TOPROL-XL) 50 MG 24 hr tablet Take 50 mg by mouth daily. Take along with the 29mtablet to make total dose of 7521m  [provider]  Multiple Vitamin (MULTIVITAMIN) capsule Take 1 capsule by mouth daily.    [provider]  niacin (NIASPAN) 1000 MG CR tablet TAKE 1 TABLET BY MOUTH TWICE A DAY Patient taking differently: Take 1,000 mg by mouth 2 (  two) times daily.  04/21/19   McGowen, Adrian Blackwater, MD  nitroGLYCERIN (NITROSTAT) 0.4 MG  SL tablet Place 0.4 mg under the tongue every 5 (five) minutes as needed for chest pain.  09/04/17   [provider]  omeprazole (PRILOSEC) 10 MG capsule TAKE 1 CAPSULE BY MOUTH EVERY DAY 09/25/19   McGowen, Adrian Blackwater, MD  oxyCODONE-acetaminophen (PERCOCET) 5-325 MG tablet Take 1 tablet by mouth every 4 (four) hours as needed for moderate pain or severe pain. Post-operatively 10/29/19 10/28/20  Alexis Frock, MD  rosuvastatin (CRESTOR) 40 MG tablet take 1 tablet by mouth once daily Patient taking differently: Take 40 mg by mouth daily.  02/05/17   Lelon Perla, MD  sacubitril-valsartan (ENTRESTO) 24-26 MG Take 0.5 tablets by mouth 2 (two) times daily.  07/19/17   [provider]  senna-docusate (SENOKOT-S) 8.6-50 MG tablet Take 1 tablet by mouth 2 (two) times daily. While taking strong pain meds to prevent constipation. 10/29/19   Alexis Frock, MD  torsemide (DEMADEX) 20 MG tablet Take 20 mg by mouth daily as needed (swelling).    [provider]  vitamin C (ASCORBIC ACID) 250 MG tablet Take 250 mg by mouth daily.    [provider]  VITAMIN D, CHOLECALCIFEROL, PO Take 1 tablet by mouth daily.     [provider]    Physical Exam: Vitals:   11/27/19 1134 11/27/19 1600  BP: (!) 126/59   Pulse: 92   Resp: 17   Temp: 99.7 F (37.6 C)   TempSrc: Oral   SpO2: 94%   Weight:  (!) 144.2 kg      Constitutional: Morbidly obese chronically ill male sitting up in bed, AAOx3, no distress Vitals:   11/27/19 1134 11/27/19 1600  BP: (!) 126/59   Pulse: 92   Resp: 17   Temp: 99.7 F (37.6 C)   TempSrc: Oral   SpO2: 94%   Weight:  (!) 144.2 kg   HEENT: Neck obese unable to assess JVD Respiratory: Distant breath sounds, clear overall.  Cardiovascular: S1-S2, regular rhythm, systolic murmur noted Abdomen: Soft obese, nontender, bowel sounds present GU: Dorsal slit noted on foreskin, minimal erythema Ext: Left transmetatarsal amputation, site  appears to have healed well, no erythema tenderness warmth or swelling Skin: no rashes on exposed skin Neurologic: Moves all extremities, no localizing signs Psychiatric: Appropriate mood and affect  Labs on Admission: I have personally reviewed following labs and imaging studies  CBC: Recent Labs  Lab 11/26/19 1555 11/27/19 1310  WBC 7.7 9.5  NEUTROABS 5.9 7.6  HGB 11.1* 11.3*  HCT 35.6* 35.6*  MCV 93.2 90.8  PLT 222 177   Basic Metabolic Panel: Recent Labs  Lab 11/26/19 1555 11/27/19 1310  NA 139 137  K 4.3 4.0  CL 103 103  CO2 24 21*  GLUCOSE 154* 139*  BUN 10 11  CREATININE 0.78 0.88  CALCIUM 8.8* 8.9   GFR: Estimated Creatinine Clearance: 98.7 mL/min (by C-G formula based on SCr of 0.88 mg/dL). Liver Function Tests: Recent Labs  Lab 11/26/19 1555 11/27/19 1310  AST 30 34  ALT 31 31  ALKPHOS 67 67  BILITOT 0.8 0.9  PROT 6.0* 6.7  ALBUMIN 2.9* 2.9*   No results for input(s): LIPASE, AMYLASE in the last 168 hours. No results for input(s): AMMONIA in the last 168 hours. Coagulation Profile: No results for input(s): INR, PROTIME in the last 168 hours. Cardiac Enzymes: No results for input(s): CKTOTAL, CKMB, CKMBINDEX, TROPONINI in the  last 168 hours. BNP (last 3 results) No results for input(s): PROBNP in the last 8760 hours. HbA1C: No results for input(s): HGBA1C in the last 72 hours. CBG: No results for input(s): GLUCAP in the last 168 hours. Lipid Profile: No results for input(s): CHOL, HDL, LDLCALC, TRIG, CHOLHDL, LDLDIRECT in the last 72 hours. Thyroid Function Tests: No results for input(s): TSH, T4TOTAL, FREET4, T3FREE, THYROIDAB in the last 72 hours. Anemia Panel: No results for input(s): VITAMINB12, FOLATE, FERRITIN, TIBC, IRON, RETICCTPCT in the last 72 hours. Urine analysis:    Component Value Date/Time   COLORURINE RED (A) 09/11/2019 2338   APPEARANCEUR TURBID (A) 09/11/2019 2338   LABSPEC  09/11/2019 2338    TEST NOT REPORTED DUE TO  COLOR INTERFERENCE OF URINE PIGMENT   PHURINE  09/11/2019 2338    TEST NOT REPORTED DUE TO COLOR INTERFERENCE OF URINE PIGMENT   GLUCOSEU (A) 09/11/2019 2338    TEST NOT REPORTED DUE TO COLOR INTERFERENCE OF URINE PIGMENT   GLUCOSEU NEGATIVE 05/25/2006 1353   HGBUR (A) 09/11/2019 2338    TEST NOT REPORTED DUE TO COLOR INTERFERENCE OF URINE PIGMENT   BILIRUBINUR (A) 09/11/2019 2338    TEST NOT REPORTED DUE TO COLOR INTERFERENCE OF URINE PIGMENT   BILIRUBINUR negative 12/25/2018 1408   KETONESUR (A) 09/11/2019 2338    TEST NOT REPORTED DUE TO COLOR INTERFERENCE OF URINE PIGMENT   PROTEINUR (A) 09/11/2019 2338    TEST NOT REPORTED DUE TO COLOR INTERFERENCE OF URINE PIGMENT   UROBILINOGEN 0.2 12/25/2018 1408   UROBILINOGEN 0.2 mg/dL 05/25/2006 1353   NITRITE (A) 09/11/2019 2338    TEST NOT REPORTED DUE TO COLOR INTERFERENCE OF URINE PIGMENT   LEUKOCYTESUR (A) 09/11/2019 2338    TEST NOT REPORTED DUE TO COLOR INTERFERENCE OF URINE PIGMENT   Sepsis Labs: @LABRCNTIP (procalcitonin:4,lacticidven:4) ) Recent Results (from the past 240 hour(s))  Culture, blood (single)     Status: Abnormal (Preliminary result)   Collection Time: 11/26/19  2:40 PM   Specimen: Blood  Result Value Ref Range Status   MICRO NUMBER: [id number]  Preliminary   SPECIMEN QUALITY: Adequate  Preliminary   Source BLOOD 1  Preliminary   STATUS: PRELIMINARY  Preliminary   ISOLATE 1: Gram positive cocci in chains (AA)  Preliminary    Comment: Gram positive cocci in chains , from aerobic and anaerobic bottles   COMMENT: Aerobic and anaerobic bottle received.  Preliminary  Culture, blood (single)     Status: Abnormal (Preliminary result)   Collection Time: 11/26/19  2:41 PM   Specimen: Blood  Result Value Ref Range Status   MICRO NUMBER: [id number]  Preliminary   SPECIMEN QUALITY: Adequate  Preliminary   Source BLOOD 2  Preliminary   STATUS: PRELIMINARY  Preliminary   ISOLATE 1: Gram positive cocci in chains (AA)   Preliminary    Comment: Gram positive cocci in chains , from aerobic and anaerobic bottles   COMMENT: Aerobic and anaerobic bottle received.  Preliminary     Radiological Exams on Admission: No results found.  EKG: pending at this time  Assessment/Plan  Gram-positive cocci bacteremia/sepsis -Sepsis present on admission -Infectious disease consult -Start IV daptomycin -Follow-up blood cultures from yesterday -Repeat blood cultures ordered today -Urinalysis ordered and pending -Left foot amputation site and GU recent surgical site appears unremarkable -Anticipate he will need TEE and cards input during his hospital course to rule out endocarditis -If work-up is negative for endocarditis will need MRI left foot  CAD Ischemic cardiomyopathy,  EF of 30%  status post TAVR and AICD Pulmonary hypertension -Clinically appears euvolemic -Hold torsemide at this time -Resume Entresto  COPD -Stable, no wheezing, nebs as needed  Chronic pain Peripheral neuropathy -Resume gabapentin, dose slightly lowered  Morbid obesity -BMI 43.1   DVT prophylaxis: lovenox Code Status: DNR Family Communication: Discussed with patient in detail, no family at bedside Disposition Plan: Home pending work-up and treatment of bacteremia Consults called: Infectious disease Admission status: Inpatient  Domenic Polite MD [facility]   11/27/2019, 5:48 PM

## 2019-11-27 NOTE — Progress Notes (Signed)
Pharmacy Antibiotic Note  Tim [name] is a 79 y.o. male admitted on 11/27/2019 with gm positive cocci bacteremia. Note recent history of enterococcus faecalis bacteremia in March and TAVR in place.  Pharmacy has been consulted for daptomycin dosing due to vancomycin allergy. Current SCr 0.88 and CrCl ~ 98 ml/min.   Plan: Daptomycin 1000 mg (~ 9.5 mg/kg AdjBW) every 24 hours Weekly CK on Fridays Monitor blood cultures results, renal function   Weight: (!) 144.2 kg (318 lb)  Temp (24hrs), Avg:99.7 F (37.6 C), Min:99.7 F (37.6 C), Max:99.7 F (37.6 C)  Recent Labs  Lab 11/26/19 1555 11/27/19 1310 11/27/19 1318  WBC 7.7 9.5  --   CREATININE 0.78 0.88  --   LATICACIDVEN  --   --  0.9    Estimated Creatinine Clearance: 98.7 mL/min (by C-G formula based on SCr of 0.88 mg/dL).    Allergies  Allergen Reactions  . Brimonidine Tartrate Shortness Of Breath    Alphagan-Shortness of breath  . Brimonidine Tartrate Palpitations  . Brinzolamide Shortness Of Breath    AZOPT- Shortness of breath  . Latanoprost Shortness Of Breath    XALATAN- Shortness of breath  . Nucynta [Tapentadol] Shortness Of Breath  . Sulfa Antibiotics Palpitations  . Timolol Maleate Shortness Of Breath and Other (See Comments)    TIMOPTIC- Aggravated asthma  . Cardizem  [Diltiazem Hcl] Swelling  . Diltiazem Swelling     leg swelling  . Rofecoxib Swelling     VIOXX- leg swelling  . Vancomycin Hives and Other (See Comments)    Possible "Red Man Syndrome"? > hives/blisters  . Codeine Other (See Comments)    Childhood reaction  . Tamsulosin Other (See Comments)    Dizziness   . Celecoxib Other (See Comments)    CELLBREX-confusion  . Colchicine Diarrhea    diarrhea  . Tape Rash     Thank you for allowing pharmacy to be a part of this patient's care.   Jimmy Footman, PharmD, BCPS, BCIDP Infectious Diseases Clinical Pharmacist Phone: [phone] 11/27/2019 5:00 PM

## 2019-11-27 NOTE — ED Notes (Signed)
Pt called x2 for room. No answer.

## 2019-11-27 NOTE — Telephone Encounter (Addendum)
Patient with positive blood cultures x 2 from office visit 5/26.  RN notified Terri Piedra, NP in clinic who directed patient to go directly to [facility] emergency room.  RN notified Dr Tommy Medal. Patient in agreement. He states his wife will take him now to [facility]. Landis Gandy, RN

## 2019-11-27 NOTE — ED Notes (Addendum)
This RN paged pharmacy to verify this pts medications to administer.

## 2019-11-27 NOTE — Progress Notes (Addendum)
ID Brief Progress Note:    Tim [name]  1941-01-25 [id number]    Tim [name] was seen by Dr. Tommy Medal in the ID office yesterday and appeared unwell. He has a history of enterococcus faecalis bacteremia previously treated with IV ampicillin and a few courses of PO linezolid at recent follow up to treat a L foot ulcer that was cellulitic.   Blood cultures were obtained in clinic yesterday and are now growing gram positive cocci in chains, suspicious for recurrence of enterococcal bacteremia. Will for now start Daptomycin IV to cover for enterococcus as well as staph/strep should this grow out to be alternative organism. 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   Formal consult to follow tomorrow with additional recommendations. Discussed with Dr. Melina Copa in ER.    ADDENDUM: will draw blood cultures again here in house to get faster diagnostic information with the [facility] vs waiting on culture to grow for ID from outpatient sample.   Please draw blood cultures before initiation of IV antibiotics    Janene Madeira, MSN, NP-C [email or web address] [name] Pager: [phone] Office: [phone] [place]: [phone]

## 2019-11-27 NOTE — ED Notes (Signed)
Unsuccessful stick at the L hand for blood cultures.

## 2019-11-27 NOTE — ED Provider Notes (Signed)
[facility] EMERGENCY DEPARTMENT Provider Note   CSN: [id number] Arrival date & time: 11/27/19  1131     History No chief complaint on file. cc- positive blood cultures  DAVIDMICHAEL [name] is a 79 y.o. male.  [name] has a complicated medical history including diabetes coronary disease AICD TAVR spinal stimulator.  [name] follows with ID and has had complex infections.  [name] saw Dr. Drucilla Schmidt ID yesterday with complaint of  10 days of intermittent fevers and fatigue. Fever 102-103. His blood cultures were positive today for gram-positive cocci.  [name] was sent in for admission.  Does have some shortness of breath.  No cough no sore throat no chest pain abdominal pain vomiting diarrhea or urinary symptoms.  The history is provided by the patient.       Past Medical History:  Diagnosis Date  . AICD (automatic cardioverter/defibrillator) present   . ASTHMA   . Asthma    as a child  . Balanitis    +severe phimosis+ buried penis->circ not possible but dorsal slit done 10/2019  . BPH (benign prostatic hypertrophy)    with urinary retention  . CAD (coronary artery disease)    Nonobstructive by 12/2012 cath; then 03/2016 [name] required BMS to RCA ([facility]).  In-stent restenosis on cath 11/01/16, baloon angioplasty successful--pt to take plavix (no ASA), + eliquis (for PAF).  [name] CATARACT, HX OF   . Chronic combined systolic and diastolic CHF (congestive heart failure) ([place]) 05/2016   Ischemic CM.  04/2018 EF 40-45%, grd I DD.  [name] Chronic pain syndrome    Lumbar DDD; chronic neuropathic pain (DM); has spinal stimulator and sees pain mgmt MD  . Complicated UTI (urinary tract infection) 09/2019   Phimoses, acute urinary retention, entoerococcus UTI, enterococc bacteremia.  F/u blood clx's neg x 5d.    [name] COPD   . Diabetes mellitus type 2 with complications (HCC)    CBU3A jumped from 5.7% to 6.1% 03/2015---started metformin at that time.  DM 2 dx by fasting gluc criteria 2018.  Has chronic neuropath  pain  . Elevated transaminase level 02/2016   Suspect due to fatty liver (documented on u/s 2007). Plan repeat labs 03/2016.  [name] Enterococcal bacteremia 09/2019   Phimoses, acute urinary retention, entoerococcus UTI, enterococc bacteremia.  F/u blood clx's neg x 5d.    . Essential hypertension, benign   . Fatty liver 2007   2007 u/s showed fatty liver with hepatosplenomegaly.  2019 repeat u/s->fatty liver but no cirrhosis or hepatosplenomegaly.  . FUO (fever of unknown origin) 11/26/2019  . GERD (gastroesophageal reflux disease)   . Glaucoma   . GOUT   . Hallux valgus (acquired)   . HH (hiatus hernia)   . HYPERCHOLESTEROLEMIA-PURE   . Hypogonadism male   . Lumbosacral neuritis   . Lumbosacral spondylosis    Lumbar spinal stenosis with neurogenic claudication--contributes to his chronic pain syndrome  . Normal memory function 08/2014   Neuropsychological testing ([facility]): no cognitive impairment or sign of neurodegenerative disorder.  Likely has adjustment d/o with mixed anxiety/depressed features and may benefit from low dose SNRI.    [name] Normocytic anemia 03/2016   Mild-pt needs ferritin and vit B12 level checked (as of 03/22/16). Hb stable 09/2019  . NSTEMI (non-ST elevated myocardial infarction) ([place]) 03/20/2016   BMS to RCA  . Obesity hypoventilation syndrome ([place])   . Obesity, unspecified   . Orthostatic hypotension   . OSA on CPAP    8 cm H2O  .  OSTEOARTHRITIS   . Paroxysmal atrial fibrillation ([place]) 2003    (? chronic?) Off anticoag for a while due to falls.  Then apixaban started 12/2014.  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 Ella Bodo): neuropathic pain->diff to treat, failed nucynta, failed spinal stimul trial, oxycontin hs + tramadol + gabap as of 12/2017 f/u Dr. Letta Pate.  . Personal history of colonic adenoma 10/30/2012   Diminutive adenoma, consider repeat 2019 per GI  . PFO (patent foramen ovale) 09/2019   small,  with predominately L to R shunt  . Pneumonia   . Presence of cardiac defibrillator 11/07/2017  . Primary osteoarthritis of both knees    Bone on bone of medial compartments, + signif patellofemoral arth bilat.--supartz inj series started 09/12/17  . PUD (peptic ulcer disease)   . PULMONARY HYPERTENSION, HX OF   . Secondary male hypogonadism 2017  . 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 Shortness of breath    with exertion: much improved s/p TAVR and treatment for CHF.  . Sick sinus syndrome (HCC)    PPM placed  . Thrombocytopenia ([place]) 2018   HSM on 2007 abd u/s---suspect some mild splenic sequestration chronically.  [name] Unspecified glaucoma(365.9)   . Unspecified hereditary and idiopathic peripheral neuropathy approx age 41   bilat LE's, ? left arm, too.  Feet became progressively numb + left foot pain intermittently.  Pt may be trying a spinal stimulator (as of 05/2015)  . VENOUS INSUFFICIENCY    Being followed by Dr. Sharol Given as of 10/2016 for two R LL venous stasis ulcers/skin tears.  Healed as of 10/30/16 f/u with Dr. Sharol Given.  . VENTRAL HERNIA   . Wound dehiscence 10/22/2019    Patient Active Problem List   Diagnosis Date Noted  . FUO (fever of unknown origin) 11/26/2019  . Low blood pressure reading 11/05/2019  . Wound dehiscence 10/22/2019  . ICD (implantable cardioverter-defibrillator) battery depletion   . Pressure injury of skin 09/13/2019  . Enterococcal bacteremia 09/13/2019  . 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  . 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 Urinary retention 09/13/2019  . Phimosis 09/13/2019  . SIRS (systemic inflammatory response syndrome) ([place]) 09/12/2019  . Sepsis ([place]) 09/12/2019  . Enterococcus UTI 09/12/2019  . Cellulitis of foot   . History of transmetatarsal amputation of left foot ([place])   . Subacute osteomyelitis, left ankle and foot  ([place])   . 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 Diabetic neuropathy, painful ([place]) 12/26/2016  . Neuropathic pain of foot, left 06/20/2016  . 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 Status post amputation of left great toe ([place]) 06/08/2015  . Spinal stenosis of lumbar region 02/16/2015  . Hereditary and idiopathic peripheral neuropathy 01/12/2015  . Oral thrush 12/28/2014  . Orthostatic dizziness 05/24/2014  . Paresthesias with subjective weakness 01/30/2014  . Bilateral thigh pain 01/30/2014  . Abdominal wall pain in right upper quadrant 01/22/2014  . Gross hematuria 09/25/2013  . Intertrigo 09/25/2013  . IBS (irritable bowel syndrome) 09/11/2013  . Chronic pain syndrome 08/06/2013  . CAD (coronary artery disease), native coronary artery 12/25/2012  . Paroxysmal atrial fibrillation ([place]) 12/25/2012  . Moderate aortic stenosis 12/25/2012  . Aortic stenosis 12/24/2012  . Personal history of colonic adenoma 10/30/2012  . Diverticulosis of colon (without mention of hemorrhage) 10/30/2012  . Internal  hemorrhoids 10/30/2012  . Change in bowel habits intermittent loose stools 10/30/2012  . Routine health maintenance 06/09/2012  . Hereditary peripheral neuropathy 10/10/2011  . Long term current use of anticoagulant - apixiban 08/16/2010  . Dyspnea on exertion 09/08/2009  . HYPERCHOLESTEROLEMIA-PURE 01/13/2009  . Essential hypertension, benign 01/13/2009  . EDEMA 01/13/2009  . GOUT 01/12/2009  . GLAUCOMA 01/12/2009  . Venous insufficiency of both lower extremities 01/12/2009  . COPD (chronic obstructive pulmonary disease) ([place]) 01/12/2009  . VENTRAL HERNIA 01/12/2009  . OSTEOARTHRITIS 01/12/2009  . ARTHRITIS 01/12/2009  . DEGENERATIVE DISC DISEASE 01/12/2009  . CATARACT, HX OF 01/12/2009  . HEART MURMUR, HX OF 01/12/2009  . BENIGN PROSTATIC HYPERTROPHY, HX OF 01/12/2009  . Obesity 01/17/2007  . OBSTRUCTIVE SLEEP  APNEA 01/17/2007  . ASTHMA 01/17/2007  . GERD 01/17/2007  . HALLUX VALGUS, ACQUIRED 01/17/2007  . PULMONARY HYPERTENSION, HX OF 01/17/2007    Past Surgical History:  Procedure Laterality Date  . AMPUTATION Left 04/11/2013   Procedure: AMPUTATION DIGIT Left 3rd toe;  Surgeon: [name], MD;  Location: [facility];  Service: Orthopedics;  Laterality: Left;  Left 3rd toe amputation at [facility]  . AMPUTATION Left 08/29/2019   Procedure: LEFT TRANSMETATARSAL AMPUTATION;  Surgeon: [name], MD;  Location: [facility];  Service: Orthopedics;  Laterality: Left;  . CARDIAC CATHETERIZATION  1997; 03/10/16   1997 Non-obstructive disease.  03/2016 BMS to RCA, with 25% pDiag dz, o/w normal cors per cath 03/07/16.  Cath 11/01/16: in stent restenosis, successful baloon angioplasty.  [name] CARDIAC CATHETERIZATION  12/24/2012   mild < 20% LCx, prox 30% RCA; LVEF 55-65% , moderate pulmonary HTN, moderate AS  . CARDIAC DEFIBRILLATOR PLACEMENT  11/07/2017   [facility] MRI Quad CRT defibrillator  . CARDIOVASCULAR STRESS TEST  05/11/16 ([facility])   Myocardial perfusion imaging:  No ischemia; scar in apex, global hypokinesis, EF 36%.  . Carotid dopplers  03/09/2016   [facility]: no hemodynamically significant stenosis on either side.  . CHOLECYSTECTOMY    . COLONOSCOPY    . COLONOSCOPY [name] 10/30/2012   Procedure: COLONOSCOPY;  Surgeon: [name], MD;  Location: [facility];  Service: Endoscopy;  Laterality: [name];  . CORONARY ANGIOPLASTY WITH STENT PLACEMENT  03/2016; 04/2017   2017-[facility]: BMS to RCA-pt was placed on Brilinta.  04/2017: DES to RCA.  . DORSAL SLIT [name] 10/29/2019   for severe phimosis. Procedure: DORSAL SLIT;  Surgeon: Alexis Frock, MD;  Location: [facility];  Service: Urology;  Laterality: [name];  42 MINS  . EYE SURGERY Bilateral cataract  . HEMORRHOID SURGERY    . INTRAOCULAR LENS INSERTION Bilateral   . KNEE SURGERY Right   . LEFT AND RIGHT HEART CATHETERIZATION WITH CORONARY ANGIOGRAM [name] 12/24/2012   Procedure:  LEFT AND RIGHT HEART CATHETERIZATION WITH CORONARY ANGIOGRAM;  Surgeon: [name], MD;  Location: [facility] CATH LAB;  Service: Cardiovascular;  Laterality: [name];  . LEG SURGERY Bilateral    lenghtening   . PACEMAKER PLACEMENT  04/13/2016   2nd deg HB after TAVR, pt had DC MDT PPM placed.  [name] SHOULDER ARTHROSCOPY  08/30/2011   Procedure: ARTHROSCOPY SHOULDER;  Surgeon: [name], MD;  Location: [facility];  Service: Orthopedics;  Laterality: Right;  Right Shoulder Arthroscopy, Debridement, and Decompression  . SPINAL CORD STIMULATOR INSERTION [name] 09/10/2015   Procedure: LUMBAR SPINAL CORD STIMULATOR INSERTION;  Surgeon: [name], MD;  Location: [facility];  Service: Neurosurgery;  Laterality: [name];  . TEE WITHOUT CARDIOVERSION [name] 09/16/2019  Procedure: TRANSESOPHAGEAL ECHOCARDIOGRAM (TEE);  Surgeon: Skeet Latch, MD;  Location: [facility];  Service: Cardiovascular;  Laterality: [name];  . 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  R big toe surg due to osteoarth  . TONSILLECTOMY    . traeculectomy Left    eye  . TRANSCATHETER AORTIC VALVE REPLACEMENT, TRANSFEMORAL  04/11/2016  . TRANSESOPHAGEAL ECHOCARDIOGRAM  03/09/2016; 09/2019   [facility]: EF 55-60%, PFO seen with bi-directional shunting, no thrombus in appendage.  09/2019 ->no valvular vegetations. Small patent foramen ovale with predominantly left to right shunting across the interatrial septum.  . TRANSTHORACIC ECHOCARDIOGRAM  01/2015; 01/2016; 05/18/16; 09/18/16, 05/2017, 08/2017   01/2015 No signif change in aortic stenosis (moderate).  01/2016 Severe LVH w/small LV cavity, EF 60-65%, grade I diast dysfxn.  05/2016 (s/p TAVR): EF 50-55%, grd I DD, biopros AV good.  08/2016--EF 50-55%, LV septal motion c/w conduction abnl, grd I DD,mild MS,bioprosth aortic valve well seated, w/trace AR. 05/2017 TTE EF 35%. 08/2017-EF 35%, mod diff hypokin LV, grd I DD, biopros AV good.   . TRANSTHORACIC ECHOCARDIOGRAM  04/2018; 09/2019   04/2018: EF 40-45%, mod  diffuse LV hypokinesis, grd I DD, bioprosth AV well seated, no AS or AR. 09/2019 EF 60-65%, grd I DD, valves fine, including bioprosth AV  . VITRECTOMY         Family History  Problem Relation Age of Onset  . Hypertension Mother   . Coronary artery disease Mother   . Heart attack Mother   . Neuropathy Mother   . Pulmonary fibrosis Father        asbestosis    Social History   Tobacco Use  . Smoking status: Never Smoker  . Smokeless tobacco: Never Used  Substance Use Topics  . Alcohol use: No    Alcohol/week: 0.0 standard drinks  . Drug use: No    Types: Oxycodone    Home Medications Prior to Admission medications   Medication Sig Start Date End Date Taking? Authorizing Provider  albuterol (PROVENTIL HFA;VENTOLIN HFA) 108 (90 BASE) MCG/ACT inhaler Inhale 2 puffs into the lungs 4 (four) times daily as needed for wheezing or shortness of breath.    [provider]  allopurinol (ZYLOPRIM) 300 MG tablet TAKE 1 TABLET BY MOUTH ONCE DAILY WITH FOOD Patient taking differently: Take 300 mg by mouth daily.  06/12/19   McGowen, Adrian Blackwater, MD  amoxicillin-clavulanate (AUGMENTIN) 875-125 MG tablet Take 1 tablet by mouth 2 (two) times daily. 11/26/19   Truman Hayward, MD  ASPIRIN LOW DOSE 81 MG EC tablet Take 81 mg by mouth daily. 02/03/19   [provider]  B Complex-C (B-COMPLEX WITH VITAMIN C) tablet Take 1 tablet by mouth daily. Take 1 tablet daily, 245m    [provider]  ezetimibe (ZETIA) 10 MG tablet take 1 tablet by mouth once daily Patient taking differently: Take 10 mg by mouth daily.  01/18/17   CLelon Perla MD  finasteride (PROSCAR) 5 MG tablet TAKE 1 TABLET BY MOUTH EVERY DAY Patient taking differently: Take 5 mg by mouth daily.  09/08/19   McGowen, PAdrian Blackwater MD  fluticasone (FLONASE) 50 MCG/ACT nasal spray Place 2 sprays into both nostrils as needed for allergies or rhinitis.    [provider]  Fluticasone-Salmeterol (ADVAIR) 250-50  MCG/DOSE AEPB Inhale 1 puff into the lungs 2 (two) times daily.    [provider]  gabapentin (NEURONTIN) 800 MG tablet TAKE 1 TABLET (800 MG TOTAL) BY MOUTH 4 (  FOUR) TIMES DAILY. 04/21/19   Kirsteins, Luanna Salk, MD  isosorbide mononitrate (IMDUR) 60 MG 24 hr tablet TAKE 1 TABLET BY MOUTH EVERY DAY Patient taking differently: Take 30 mg by mouth daily.  08/28/19   McGowen, Adrian Blackwater, MD  metoprolol succinate (TOPROL-XL) 50 MG 24 hr tablet Take 50 mg by mouth daily. Take along with the 43m tablet to make total dose of 712m   [provider]  Multiple Vitamin (MULTIVITAMIN) capsule Take 1 capsule by mouth daily.    [provider]  niacin (NIASPAN) 1000 MG CR tablet TAKE 1 TABLET BY MOUTH TWICE A DAY Patient taking differently: Take 1,000 mg by mouth 2 (two) times daily.  04/21/19   McGowen, PhAdrian BlackwaterMD  nitroGLYCERIN (NITROSTAT) 0.4 MG SL tablet Place 0.4 mg under the tongue every 5 (five) minutes as needed for chest pain.  09/04/17   [provider]  omeprazole (PRILOSEC) 10 MG capsule TAKE 1 CAPSULE BY MOUTH EVERY DAY 09/25/19   McGowen, PhAdrian BlackwaterMD  oxyCODONE-acetaminophen (PERCOCET) 5-325 MG tablet Take 1 tablet by mouth every 4 (four) hours as needed for moderate pain or severe pain. Post-operatively 10/29/19 10/28/20  MaAlexis FrockMD  rosuvastatin (CRESTOR) 40 MG tablet take 1 tablet by mouth once daily Patient taking differently: Take 40 mg by mouth daily.  02/05/17   CrLelon PerlaMD  sacubitril-valsartan (ENTRESTO) 24-26 MG Take 0.5 tablets by mouth 2 (two) times daily.  07/19/17   [provider]  senna-docusate (SENOKOT-S) 8.6-50 MG tablet Take 1 tablet by mouth 2 (two) times daily. While taking strong pain meds to prevent constipation. 10/29/19   MaAlexis FrockMD  torsemide (DEMADEX) 20 MG tablet Take 20 mg by mouth daily as needed (swelling).    [provider]  vitamin C (ASCORBIC ACID) 250 MG tablet Take 250 mg by mouth daily.     [provider]  VITAMIN D, CHOLECALCIFEROL, PO Take 1 tablet by mouth daily.     [provider]    Allergies    Brimonidine tartrate, Brimonidine tartrate, Brinzolamide, Latanoprost, Nucynta [tapentadol], Sulfa antibiotics, Timolol maleate, Cardizem  [diltiazem hcl], Diltiazem, Rofecoxib, Vancomycin, Codeine, Tamsulosin, Celecoxib, Colchicine, and Tape  Review of Systems   Review of Systems  Constitutional: Positive for fatigue and fever.  HENT: Negative for sore throat.   Eyes: Negative for visual disturbance.  Respiratory: Positive for shortness of breath.   Cardiovascular: Negative for chest pain.  Gastrointestinal: Negative for abdominal pain.  Genitourinary: Negative for dysuria.  Musculoskeletal: Negative for arthralgias.  Skin: Negative for rash.  Neurological: Positive for light-headedness.    Physical Exam Updated Vital Signs BP (!) 126/59 (BP Location: Right Arm)   Pulse 92   Temp 99.7 F (37.6 C) (Oral)   Resp 17   SpO2 94%   Physical Exam Vitals and nursing note reviewed.  Constitutional:      General: [name] is not in acute distress.    Appearance: [name] is well-developed. [name] is obese.  HENT:     Head: Normocephalic and atraumatic.  Eyes:     Conjunctiva/sclera: Conjunctivae normal.  Cardiovascular:     Rate and Rhythm: Normal rate and regular rhythm.     Heart sounds: No murmur.  Pulmonary:     Effort: Pulmonary effort is normal. No respiratory distress.     Breath sounds: Normal breath sounds.  Abdominal:     Palpations: Abdomen is soft.     Tenderness: There is no abdominal tenderness.  Musculoskeletal:        General: Normal range of motion.     Cervical back: Neck supple.     Right lower leg: Edema present.     Left lower leg: Edema present.  Skin:    General: Skin is warm and dry.     Capillary Refill: Capillary refill takes less than 2 seconds.  Neurological:     General: No focal deficit present.     Mental Status: [name] is  alert and oriented to person, place, and time.     ED Results / Procedures / Treatments   Labs (all labs ordered are listed, but only abnormal results are displayed) Labs Reviewed  CBC WITH DIFFERENTIAL/PLATELET - Abnormal; Notable for the following components:      Result Value   RBC 3.92 (*)    Hemoglobin 11.3 (*)    HCT 35.6 (*)    All other components within normal limits  COMPREHENSIVE METABOLIC PANEL - Abnormal; Notable for the following components:   CO2 21 (*)    Glucose, Bld 139 (*)    Albumin 2.9 (*)    All other components within normal limits  SARS CORONAVIRUS 2 BY RT PCR (HOSPITAL ORDER, [facility])  URINE CULTURE  CULTURE, BLOOD (ROUTINE X 2)  CULTURE, BLOOD (ROUTINE X 2)  LACTIC ACID, PLASMA  URINALYSIS, COMPLETE (UACMP) WITH MICROSCOPIC  CK  CBC  CREATININE, SERUM  CBC  COMPREHENSIVE METABOLIC PANEL  URINALYSIS, ROUTINE W REFLEX MICROSCOPIC    EKG None  Radiology DG Chest Port 1 View  Result Date: 11/27/2019 CLINICAL DATA:  Fever EXAM: PORTABLE CHEST 1 VIEW COMPARISON:  09/12/2019 FINDINGS: Pacer. Midline trachea. Mild cardiomegaly. Atherosclerosis in the transverse aorta. No pleural effusion or pneumothorax. No congestive failure. Clear lungs. IMPRESSION: Cardiomegaly, without congestive failure or acute disease. Aortic Atherosclerosis (ICD10-I70.0). Electronically Signed   By: Abigail Miyamoto M.D.   On: 11/27/2019 18:29    Procedures Procedures (including critical care time)  Medications Ordered in ED Medications  DAPTOmycin (CUBICIN) 1,000 mg in sodium chloride 0.9 % IVPB (0 mg Intravenous Stopped 11/27/19 1946)  allopurinol (ZYLOPRIM) tablet 300 mg (has no administration in time range)  aspirin EC tablet 81 mg (81 mg Oral Given 11/27/19 2005)  oxyCODONE-acetaminophen (PERCOCET/ROXICET) 5-325 MG per tablet 1 tablet (has no administration in time range)  ezetimibe (ZETIA) tablet 10 mg (has no administration in time range)    metoprolol succinate (TOPROL-XL) 24 hr tablet 50 mg (0 mg Oral Hold 11/27/19 2016)  niacin (NIASPAN) CR tablet 1,000 mg (has no administration in time range)  nitroGLYCERIN (NITROSTAT) SL tablet 0.4 mg (has no administration in time range)  rosuvastatin (CRESTOR) tablet 40 mg (40 mg Oral Given 11/27/19 2003)  sacubitril-valsartan (ENTRESTO) 24-26 mg per tablet (has no administration in time range)  pantoprazole (PROTONIX) EC tablet 40 mg (40 mg Oral Given 11/27/19 2003)  senna-docusate (Senokot-S) tablet 1 tablet (has no administration in time range)  finasteride (PROSCAR) tablet 5 mg (has no administration in time range)  albuterol (VENTOLIN HFA) 108 (90 Base) MCG/ACT inhaler 2 puff (has no administration in time range)  fluticasone (FLONASE) 50 MCG/ACT nasal spray 2 spray (has no administration in time range)  mometasone-formoterol (DULERA) 200-5 MCG/ACT inhaler 2 puff (0 puffs Inhalation Hold 11/27/19 2016)  enoxaparin (LOVENOX) injection 40 mg (40 mg Subcutaneous Given 11/27/19 2004)  acetaminophen (TYLENOL) tablet 650 mg (has no administration in time range)    Or  acetaminophen (TYLENOL) suppository 650 mg (  has no administration in time range)  ondansetron (ZOFRAN) tablet 4 mg (has no administration in time range)    Or  ondansetron (ZOFRAN) injection 4 mg (has no administration in time range)  gabapentin (NEURONTIN) tablet 600 mg (has no administration in time range)    ED Course  I have reviewed the triage vital signs and the nursing notes.  Pertinent labs & imaging results that were available during my care of the patient were reviewed by me and considered in my medical decision making (see chart for details).  Clinical Course as of Nov 26 1825  Thu Nov 27, 2019  1658 She was seen by Dr. Evelina Bucy who recommends the patient be admitted to the hospital and put on daptomycin.   [MB]  6834 Discussed with Dr. Broadus [name] from [facility] hospitalist who will evaluate the patient for admission.    [MB]  1962 Chest x-ray showing paced area no gross infiltrates   [MB]    Clinical Course User Index [MB] Hayden Rasmussen, MD   MDM Rules/Calculators/A&P                     This patient complains of fever and fatigue; this involves an extensive number of treatment Options and is a complaint that carries with it a high risk of complications and Morbidity. The differential includes sepsis, Sirs, bacteremia, endocarditis  I ordered, reviewed and interpreted labs, which included normal white count, stable hemoglobin, normal chemistry other than a low bicarb of 21.  Lactate normal at 0.9. I ordered medication IV daptomycin I ordered imaging studies which included chest x-ray and I independently    visualized and interpreted imaging which showed no gross infiltrates Additional history obtained from Dr. Evelina Bucy ID Previous records obtained and reviewed in epic including last admission. I consulted [facility] hospitalist Dr. Broadus [name] and discussed lab and imaging findings  After the interventions stated above, I reevaluated the patient and found patient to be hemodynamically stable.  [name] will need admission for IV antibiotics and further work-up of FUO   Final Clinical Impression(s) / ED Diagnoses Final diagnoses:  Bacteremia    Rx / DC Orders ED Discharge Orders    None       Hayden Rasmussen, MD 11/27/19 2237

## 2019-11-27 NOTE — ED Notes (Signed)
Per EDP Melina Copa; one set of Blood Cultures would be sufficient for this pt.

## 2019-11-28 ENCOUNTER — Telehealth: Payer: Self-pay | Admitting: *Deleted

## 2019-11-28 DIAGNOSIS — Z952 Presence of prosthetic heart valve: Secondary | ICD-10-CM

## 2019-11-28 DIAGNOSIS — J449 Chronic obstructive pulmonary disease, unspecified: Secondary | ICD-10-CM

## 2019-11-28 DIAGNOSIS — Z95810 Presence of automatic (implantable) cardiac defibrillator: Secondary | ICD-10-CM

## 2019-11-28 DIAGNOSIS — I10 Essential (primary) hypertension: Secondary | ICD-10-CM

## 2019-11-28 DIAGNOSIS — G894 Chronic pain syndrome: Secondary | ICD-10-CM

## 2019-11-28 DIAGNOSIS — M1A9XX0 Chronic gout, unspecified, without tophus (tophi): Secondary | ICD-10-CM

## 2019-11-28 DIAGNOSIS — I480 Paroxysmal atrial fibrillation: Secondary | ICD-10-CM

## 2019-11-28 LAB — CBC
HCT: 31.2 % — ABNORMAL LOW (ref 39.0–52.0)
Hemoglobin: 9.9 g/dL — ABNORMAL LOW (ref 13.0–17.0)
MCH: 29.2 pg (ref 26.0–34.0)
MCHC: 31.7 g/dL (ref 30.0–36.0)
MCV: 92 fL (ref 80.0–100.0)
Platelets: 178 10*3/uL (ref 150–400)
RBC: 3.39 MIL/uL — ABNORMAL LOW (ref 4.22–5.81)
RDW: 15.3 % (ref 11.5–15.5)
WBC: 8.1 10*3/uL (ref 4.0–10.5)
nRBC: 0 % (ref 0.0–0.2)

## 2019-11-28 LAB — COMPREHENSIVE METABOLIC PANEL
ALT: 32 U/L (ref 0–44)
AST: 42 U/L — ABNORMAL HIGH (ref 15–41)
Albumin: 2.5 g/dL — ABNORMAL LOW (ref 3.5–5.0)
Alkaline Phosphatase: 55 U/L (ref 38–126)
Anion gap: 7 (ref 5–15)
BUN: 15 mg/dL (ref 8–23)
CO2: 27 mmol/L (ref 22–32)
Calcium: 8.8 mg/dL — ABNORMAL LOW (ref 8.9–10.3)
Chloride: 104 mmol/L (ref 98–111)
Creatinine, Ser: 0.87 mg/dL (ref 0.61–1.24)
GFR calc Af Amer: >60 mL/min (ref >60)
GFR calc non Af Amer: >60 mL/min (ref >60)
Glucose, Bld: 118 mg/dL — ABNORMAL HIGH (ref 70–99)
Potassium: 3.7 mmol/L (ref 3.5–5.1)
Sodium: 138 mmol/L (ref 135–145)
Total Bilirubin: 0.9 mg/dL (ref 0.3–1.2)
Total Protein: 5.8 g/dL — ABNORMAL LOW (ref 6.5–8.1)

## 2019-11-28 LAB — CK: Total CK: 37 U/L — ABNORMAL LOW (ref 49–397)

## 2019-11-28 MED ORDER — ENOXAPARIN SODIUM 150 MG/ML ~~LOC~~ SOLN
1.0000 mg/kg | Freq: Two times a day (BID) | SUBCUTANEOUS | Status: DC
  Administered 2019-11-28 – 2019-12-07 (×18): 145 mg via SUBCUTANEOUS
  Filled 2019-11-28 (×20): qty 0.97

## 2019-11-28 MED ORDER — SODIUM CHLORIDE 0.9 % IV SOLN
2.0000 g | INTRAVENOUS | Status: DC
  Administered 2019-11-28 – 2019-12-07 (×54): 2 g via INTRAVENOUS
  Filled 2019-11-28: qty 2000
  Filled 2019-11-28 (×3): qty 2
  Filled 2019-11-28: qty 2000
  Filled 2019-11-28 (×22): qty 2
  Filled 2019-11-28: qty 2000
  Filled 2019-11-28: qty 2
  Filled 2019-11-28 (×2): qty 2000
  Filled 2019-11-28 (×6): qty 2
  Filled 2019-11-28: qty 2000
  Filled 2019-11-28 (×2): qty 2
  Filled 2019-11-28: qty 2000
  Filled 2019-11-28 (×2): qty 2
  Filled 2019-11-28 (×2): qty 2000
  Filled 2019-11-28 (×6): qty 2
  Filled 2019-11-28 (×2): qty 2000
  Filled 2019-11-28 (×7): qty 2
  Filled 2019-11-28 (×2): qty 2000

## 2019-11-28 MED ORDER — ENOXAPARIN SODIUM 80 MG/0.8ML ~~LOC~~ SOLN: 0.5000 mg/kg | SUBCUTANEOUS | Status: DC

## 2019-11-28 MED ORDER — GABAPENTIN 800 MG PO TABS
800.0000 mg | ORAL_TABLET | Freq: Four times a day (QID) | ORAL | Status: DC
  Filled 2019-11-28 (×2): qty 1

## 2019-11-28 MED ORDER — CHLORHEXIDINE GLUCONATE CLOTH 2 % EX PADS
6.0000 | MEDICATED_PAD | Freq: Every day | CUTANEOUS | Status: DC
  Administered 2019-11-29 – 2019-12-07 (×9): 6 via TOPICAL

## 2019-11-28 MED ORDER — SODIUM CHLORIDE 0.9 % IV SOLN
2.0000 g | Freq: Two times a day (BID) | INTRAVENOUS | Status: DC
  Administered 2019-11-28 – 2019-12-07 (×18): 2 g via INTRAVENOUS
  Filled 2019-11-28 (×11): qty 2
  Filled 2019-11-28: qty 20
  Filled 2019-11-28 (×3): qty 2
  Filled 2019-11-28 (×2): qty 20
  Filled 2019-11-28 (×3): qty 2

## 2019-11-28 MED ORDER — GABAPENTIN 400 MG PO CAPS
800.0000 mg | ORAL_CAPSULE | Freq: Four times a day (QID) | ORAL | Status: DC
  Administered 2019-11-28 – 2019-12-07 (×35): 800 mg via ORAL
  Filled 2019-11-28: qty 8
  Filled 2019-11-28 (×6): qty 2
  Filled 2019-11-28: qty 8
  Filled 2019-11-28 (×5): qty 2
  Filled 2019-11-28: qty 8
  Filled 2019-11-28 (×23): qty 2

## 2019-11-28 NOTE — Progress Notes (Signed)
PROGRESS NOTE    Tim [name]  HYW:[id number] DOB: December 10, 1940 DOA: 11/27/2019 PCP: Tammi Sou, MD (Confirm with patient/family/NH records and if not entered, this HAS to be entered at [name] point of entry. "No PCP" if truly none.)   No chief complaint on file.   Brief Narrative: HPI per Dr. Jodene Nam is a 79 y.o. 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 -Subsequently underwent dorsal slit procedure by Dr. Tresa Moore on 4/28 for severe phimosis with buried penis, had a Foley catheter thereafter, Foley catheter was removed about a week ago in urology clinic. -He is chronically relatively debilitated and spends most of his time in a recliner, is able to ambulate a little using a walker at home. -He reports weakness dizziness and fevers off and on for the past 2 weeks, temperatures as high as 102, he was seen in the infectious disease clinic yesterday with the symptoms and had blood cultures drawn, he was asked to start oral Augmentin of which she took 2 doses, Dr. Tommy Medal was concerned about the possibility of bacteremia and/or endocarditis. -His blood cultures today grew gram-positive cocci in chains and hence was asked to come back to the emergency room. -He denies any pain swelling or discomfort at his recent amputation site -He was ordered to get repeat blood cultures and given a  dose of daptomycin per ID recommendations  Assessment & Plan:   Principal Problem:   Bacteremia Active Problems:   GOUT   Obesity   Essential hypertension, benign   COPD (chronic obstructive pulmonary disease) ([place])   Pulmonary hypertension ([place])   Hereditary peripheral neuropathy   Aortic stenosis   Paroxysmal atrial fibrillation ([facility])   Chronic pain syndrome   Enterococcal bacteremia   S/P TAVR (transcatheter aortic valve replacement)  1 Enterococcus faecalis bacteremia/presumed endocarditis Patient treated for enterococcal bacteremia with presumed urinary source as well as deep tissue infection of the left foot which she completed.  Noted over the past 1-2 weeks to be sluggish with low-grade fevers.  Patient seen in ID clinic on 11/26/2019 with repeat blood cultures concerning for gram-positive cocci and subsequently admitted back to the hospital for antibiotic treatment and evaluation for endocarditis.  Patient received a dose of IV daptomycin.  Preliminary cultures from 11/26/2019 + for Enterococcus faecalis.  Repeat blood cultures pending.  Patient with history of TAVR.  Cardiology consulted by ID for evaluation for TEE to rule out endocarditis.  IV daptomycin has been narrowed to IV ampicillin IV Rocephin per ID recommendations.  ID consulted with cardiology and patient for TEE early next week.  ID following and I appreciate the input and recommendations.  2.  Coronary artery disease/ischemic cardiomyopathy EF 30%/status post TAVR and AICD/pulmonary hypertension Currently euvolemic.  Continue aspirin, Zetia, Toprol-XL, Crestor, Entresto.  Follow.  Could likely resume torsemide in the next 1 to 2 days if blood pressure stable.  3.  COPD Stable.  Continue Dulera.  Nebs  as needed.  4.  Chronic pain/peripheral neuropathy Increase Neurontin back to home dose of 800 mg 4 times daily.  5.  Morbid obesity  6.  Hypertension Blood pressure currently stable.  Continue Toprol-XL, Entresto.   Torsemide on hold.  7.  History of gout Stable.  Continue allopurinol.  8.  Paroxysmal atrial fibrillation Currently rate controlled.  Toprol-XL for rate control.  Full dose Lovenox for now.    DVT prophylaxis: Lovenox Code Status: DNR Family Communication: Updated patient.  No family at bedside. Disposition:   Status is: Inpatient    Dispo: The patient is from: Home              Anticipated d/c is to: Home              Anticipated d/c date is: Next week after TEE is done, repeat blood cultures negative and when cleared by ID.              Patient currently on IV antibiotics for bacteremia, will need a TEE that will be done early next week to rule out endocarditis, ID following.        Consultants:   ID Graylon Good  Procedures:   Chest x-ray 11/27/2019    Antimicrobials:   IV daptomycin 11/27/2019>>>> 11/28/2019  IV Rocephin 11/28/2019  IV ampicillin 11/28/2019     Subjective: Sitting up in bed.  Denies chest pain.  No shortness of breath.  No abdominal pain.  Wondering whether he was going to get an MRI of his left foot as he stated he was to get it in the outpatient setting per his ID doctor.  Objective: Vitals:   11/27/19 2201 11/27/19 2223 11/27/19 2242 11/28/19 0453  BP: 117/62  135/61 (!) 105/55  Pulse: 89  86 77  Resp:   17 15  Temp:  97.8 F (36.6 C) 98.1 F (36.7 C) 97.9 F (36.6 C)  TempSrc:  Oral  Oral  SpO2: 96%  99% 98%  Weight:   (!) 144.3 kg     Intake/Output Summary (Last 24 hours) at 11/28/2019 1025 Last data filed at 11/27/2019 1946 Gross per 24 hour  Intake 131.6 ml  Output --  Net 131.6 ml   Filed Weights   11/27/19 1600 11/27/19 2242  Weight: (!) 144.2 kg (!) 144.3 kg    Examination:  General exam: Appears calm and comfortable  Respiratory system: Clear to auscultation. Respiratory effort normal. Cardiovascular system: S1 & S2 heard, RRR. No JVD, murmurs, rubs, gallops or clicks. No pedal edema. Gastrointestinal system:  Abdomen is nondistended, soft and nontender. No organomegaly or masses felt. Normal bowel sounds heard. Central nervous system: Alert and oriented. No focal neurological deficits. Extremities: Status post left transmetatarsal amputation.  Skin: No rashes, lesions or ulcers Psychiatry: Judgement and insight appear normal. Mood & affect appropriate.     Data Reviewed: I have personally reviewed following labs and imaging studies  CBC: Recent Labs  Lab 11/26/19 1555 11/27/19 1310 11/28/19 0546  WBC 7.7 9.5 8.1  NEUTROABS 5.9 7.6  --   HGB 11.1* 11.3* 9.9*  HCT 35.6* 35.6* 31.2*  MCV 93.2 90.8 92.0  PLT 222 212 833    Basic Metabolic Panel: Recent Labs  Lab 11/26/19 1555 11/27/19 1310 11/28/19 0546  NA 139 137 138  K 4.3 4.0 3.7  CL 103 103 104  CO2 24 21* 27  GLUCOSE 154* 139* 118*  BUN 10 11 15   CREATININE 0.78 0.88 0.87  CALCIUM 8.8* 8.9  8.8*    GFR: Estimated Creatinine Clearance: 99.9 mL/min (by C-G formula based on SCr of 0.87 mg/dL).  Liver Function Tests: Recent Labs  Lab 11/26/19 1555 11/27/19 1310 11/28/19 0546  AST 30 34 42*  ALT 31 31 32  ALKPHOS 67 67 55  BILITOT 0.8 0.9 0.9  PROT 6.0* 6.7 5.8*  ALBUMIN 2.9* 2.9* 2.5*    CBG: No results for input(s): GLUCAP in the last 168 hours.   Recent Results (from the past 240 hour(s))  Culture, blood (single)     Status: Abnormal (Preliminary result)   Collection Time: 11/26/19  2:40 PM   Specimen: Blood  Result Value Ref Range Status   MICRO NUMBER: [id number]  Preliminary   SPECIMEN QUALITY: Adequate  Preliminary   Source BLOOD 1  Preliminary   STATUS: PRELIMINARY  Preliminary   ISOLATE 1: Gram positive cocci in chains (AA)  Preliminary    Comment: Gram positive cocci in chains , from aerobic and anaerobic bottles   COMMENT: Aerobic and anaerobic bottle received.  Preliminary  Culture, blood (single)     Status: Abnormal (Preliminary result)   Collection Time: 11/26/19  2:41 PM   Specimen:  Blood  Result Value Ref Range Status   MICRO NUMBER: [id number]  Preliminary   SPECIMEN QUALITY: Adequate  Preliminary   Source BLOOD 2  Preliminary   STATUS: PRELIMINARY  Preliminary   ISOLATE 1: Gram positive cocci in chains (AA)  Preliminary    Comment: Gram positive cocci in chains , from aerobic and anaerobic bottles   COMMENT: Aerobic and anaerobic bottle received.  Preliminary  SARS Coronavirus 2 by RT PCR (hospital order, performed in [facility] hospital lab) Nasopharyngeal Nasopharyngeal Swab     Status: None   Collection Time: 11/27/19  5:05 PM   Specimen: Nasopharyngeal Swab  Result Value Ref Range Status   SARS Coronavirus 2 NEGATIVE NEGATIVE Final    Comment: (NOTE) SARS-CoV-2 target nucleic acids are NOT DETECTED. The SARS-CoV-2 RNA is generally detectable in upper and lower respiratory specimens during the acute phase of infection. The lowest concentration of SARS-CoV-2 viral copies this assay can detect is 250 copies / mL. A negative result does not preclude SARS-CoV-2 infection and should not be used as the sole basis for treatment or other patient management decisions.  A negative result may occur with improper specimen collection / handling, submission of specimen other than nasopharyngeal swab, presence of viral mutation(s) within the areas targeted by this assay, and inadequate number of viral copies (<250 copies / mL). A negative result must be combined with clinical observations, patient history, and epidemiological information. Fact Sheet for Patients:   StrictlyIdeas.no Fact Sheet for Healthcare Providers: BankingDealers.co.za This test is not yet approved or cleared  by the [place] FDA and has been authorized for detection and/or diagnosis of SARS-CoV-2 by FDA under an Emergency Use Authorization (EUA).  This EUA will remain in effect (meaning this test can be used) for the duration of the COVID-19  declaration under Section 564(b)(1) of the Act, 21 U.S.C. section 360bbb-3(b)(1), unless the authorization is terminated or revoked sooner. Performed at [facility], [place] [address] [place], [place] [address]   Culture, blood (routine x 2)     Status: None (Preliminary result)   Collection Time: 11/27/19  6:24 PM   Specimen: BLOOD RIGHT HAND  Result Value Ref Range Status   Specimen Description BLOOD RIGHT HAND  Final   Special Requests   Final  BOTTLES DRAWN AEROBIC AND ANAEROBIC Blood Culture results may not be optimal due to an inadequate volume of blood received in culture bottles   Culture   Final    NO GROWTH < 12 HOURS Performed at [facility] [address] [place], [place] [address]    Report Status PENDING  Incomplete         Radiology Studies: DG Chest Port 1 View  Result Date: 11/27/2019 CLINICAL DATA:  Fever EXAM: PORTABLE CHEST 1 VIEW COMPARISON:  09/12/2019 FINDINGS: Pacer. Midline trachea. Mild cardiomegaly. Atherosclerosis in the transverse aorta. No pleural effusion or pneumothorax. No congestive failure. Clear lungs. IMPRESSION: Cardiomegaly, without congestive failure or acute disease. Aortic Atherosclerosis (ICD10-I70.0). Electronically Signed   By: Abigail Miyamoto M.D.   On: 11/27/2019 18:29        Scheduled Meds: . allopurinol  300 mg Oral Daily  . aspirin EC  81 mg Oral Daily  . enoxaparin (LOVENOX) injection  0.5 mg/kg Subcutaneous Q24H  . ezetimibe  10 mg Oral Daily  . finasteride  5 mg Oral Daily  . gabapentin  600 mg Oral QID  . metoprolol succinate  50 mg Oral Daily  . mometasone-formoterol  2 puff Inhalation BID  . niacin  1,000 mg Oral BID  . pantoprazole  40 mg Oral Daily  . rosuvastatin  40 mg Oral Daily  . sacubitril-valsartan  0.5 tablet Oral BID  . senna-docusate  1 tablet Oral BID   Continuous Infusions: . DAPTOmycin (CUBICIN)  IV Stopped (11/27/19 1946)     LOS: 1 day    Time spent: 40 minutes    Irine Seal, MD [facility]   To contact the attending provider between 7A-7P or the covering provider during after hours 7P-7A, please log into the web site [email or web address] and access using universal [place] password for that web site. If you do not have the password, please call the hospital operator.  11/28/2019, 10:25 AM

## 2019-11-28 NOTE — Progress Notes (Signed)
Patient was placed on CPAP of 8 cmH2O via a nasal mask.  Patient stated that 8 cmH2O is home regimen. RT advised patient that it was ok to have spouse bring in home mask if to uncomfortable as it wasn't in his belongings bag at the bedside. Patient is tolerating at this time.  RT will continue to monitor.     11/28/19 0128  BiPAP/CPAP/SIPAP  $ Non-Invasive Ventilator  Non-Invasive Vent Set Up  $ Non-Invasive Home Ventilator  Initial  BiPAP/CPAP/SIPAP Pt Type Adult  Mask Type Nasal mask  Mask Size Small  Respiratory Rate 20 breaths/min  EPAP 8 cmH2O (per home regimen)  Oxygen Percent 21 %  BiPAP/CPAP/SIPAP CPAP  Patient Home Equipment No  Auto Titrate No

## 2019-11-28 NOTE — Telephone Encounter (Signed)
Per radiology scheduling, patient cannot have MRI of the foot due to his pacemaker and stimulator.  He can have a MRI of head only and on the 1.5 machine as opposed to the 3.0 machine. Landis Gandy, RN

## 2019-11-28 NOTE — Addendum Note (Signed)
Addended by: Landis Gandy on: 11/28/2019 09:16 AM   Modules accepted: Orders

## 2019-11-28 NOTE — Consult Note (Signed)
[facility] for Infectious Disease    Date of Admission:  11/27/2019     Total days of antibiotics 1   Daptomycin                 Reason for Consult: Gram positive cocci in chains on blood cultures    Referring Provider: Dr. Tommy Medal  Primary Care Provider: WARDELL [name] is a 79 y.o. male    Assessment: Tim [name] is a 79 y.o. male who was recently treated for enterococcal bacteremia that was thought to be due to a UTI. He completed treatment for this at the end of March 2021. Since then he has required several rounds of PO antibiotics for foot infection / cellulitis with last course 3 weeks ago with Linezolid up until Augmentin that was given to him during outpatient ID visit when it was realized he was likely septic again. Outside blood cultures have now been identified as enterococcus faecalis again --> with high degree of suspicion his pacer and / or TAVR are infected will start Ampicillin + Ceftriaxone BID.   Over the last 3 weeks his foot has healed significantly and is without any drainage or signs of infection. Would follow foot for now and await TEE results before this is worked up further.  If this were still infected I don't think it would have healed in as much as it has.  Pending TEE will need to notify EP vs TAVR / structural heart team for assistance.     Plan: 1. I called Cardiology for TEE --> will be scheduled next Tuesday  2. Change to IV Ampicillin + Ceftriaxone BID  3. Follow repeated blood cultures for clearance     Principal Problem:   Gram-positive bacteremia Active Problems:   Enterococcal bacteremia history    ICD (implantable cardioverter-defibrillator) in place   S/P TAVR (transcatheter aortic valve replacement)   GOUT   Obesity   Essential hypertension, benign   COPD (chronic obstructive pulmonary disease) (HCC)   Pulmonary hypertension (HCC)   Hereditary peripheral neuropathy   Aortic stenosis   Paroxysmal atrial  fibrillation (HCC)   Chronic pain syndrome   . allopurinol  300 mg Oral Daily  . aspirin EC  81 mg Oral Daily  . enoxaparin (LOVENOX) injection  0.5 mg/kg Subcutaneous Q24H  . ezetimibe  10 mg Oral Daily  . finasteride  5 mg Oral Daily  . gabapentin  600 mg Oral QID  . metoprolol succinate  50 mg Oral Daily  . mometasone-formoterol  2 puff Inhalation BID  . niacin  1,000 mg Oral BID  . pantoprazole  40 mg Oral Daily  . rosuvastatin  40 mg Oral Daily  . sacubitril-valsartan  0.5 tablet Oral BID  . senna-docusate  1 tablet Oral BID    HPI: Tim [name] is a 79 y.o. male with a history of recent enterococcal bacteremia in March 2021 that was thought to be due to a UTI (phimosis and recently GU procedure with chronic foley). TEE was done at this visit which did not reveal any endocarditis on the PPM/AICD lead or the TAVR valve. The gradient was elevated @ 38mHg, however--> I can't find a previous [place]).  He was treated with Ampicillin through March 26th 2021 for his bacteremia.  Since this time he has struggled with various issues with his left foot He received 14d linezolid  4/21 for cellulitis of the L foot and ulceration; Doxycycline shortly prior to that and a supply of Ampicillin PO in early April for an unknown reason.   His last dose of antibiotic was 3 weeks ago when he completed his linezolid. Over the last 1 week he has had intermittent fevers that would resolve with Tylenol. He has had weakness in general with difficulty getting to the bathroom; he mostly stays in the recliner due to instructions from Dr. Sharol Given to help heal surgical wound dehiscence from left TMA that was performed in Feb 2021 for subacute osteomyelitis of the forefoot.  He denies any GI/GU symptoms. He had dorsal split procedure done for severe phimosis in April 2021 which is healing well. Eating well but not as much as normal. Not sure if  he has had any weight loss.    Review of Systems: Review of Systems  All other systems reviewed and are negative.   Past Medical History:  Diagnosis Date  . AICD (automatic cardioverter/defibrillator) present   . ASTHMA   . Asthma    as a child  . Balanitis    +severe phimosis+ buried penis->circ not possible but dorsal slit done 10/2019  . BPH (benign prostatic hypertrophy)    with urinary retention  . CAD (coronary artery disease)    Nonobstructive by 12/2012 cath; then 03/2016 he required BMS to RCA ([facility]).  In-stent restenosis on cath 11/01/16, baloon angioplasty successful--pt to take plavix (no ASA), + eliquis (for PAF).  [name] CATARACT, HX OF   . Chronic combined systolic and diastolic CHF (congestive heart failure) ([place]) 05/2016   Ischemic CM.  04/2018 EF 40-45%, grd I DD.  [name] Chronic pain syndrome    Lumbar DDD; chronic neuropathic pain (DM); has spinal stimulator and sees pain mgmt MD  . Complicated UTI (urinary tract infection) 09/2019   Phimoses, acute urinary retention, entoerococcus UTI, enterococc bacteremia.  F/u blood clx's neg x 5d.    [name] COPD   . Diabetes mellitus type 2 with complications (HCC)    UEA5W jumped from 5.7% to 6.1% 03/2015---started metformin at that time.  DM 2 dx by fasting gluc criteria 2018.  Has chronic neuropath pain  . Elevated transaminase level 02/2016   Suspect due to fatty liver (documented on u/s 2007). Plan repeat labs 03/2016.  [name] Enterococcal bacteremia 09/2019   Phimoses, acute urinary retention, entoerococcus UTI, enterococc bacteremia.  F/u blood clx's neg x 5d.    . Essential hypertension, benign   . Fatty liver 2007   2007 u/s showed fatty liver with hepatosplenomegaly.  2019 repeat u/s->fatty liver but no cirrhosis or hepatosplenomegaly.  . FUO (fever of unknown origin) 11/26/2019  . GERD (gastroesophageal reflux disease)   . Glaucoma   . GOUT   . Hallux valgus (acquired)   . HH (hiatus hernia)   . HYPERCHOLESTEROLEMIA-PURE   .  Hypogonadism male   . Lumbosacral neuritis   . Lumbosacral spondylosis    Lumbar spinal stenosis with neurogenic claudication--contributes to his chronic pain syndrome  . Normal memory function 08/2014   Neuropsychological testing ([facility]): no cognitive impairment or sign of neurodegenerative disorder.  Likely has adjustment d/o with mixed anxiety/depressed features and may benefit from low dose SNRI.    [name] Normocytic anemia 03/2016   Mild-pt needs ferritin and vit B12 level checked (as of 03/22/16). Hb stable 09/2019  . NSTEMI (non-ST elevated myocardial infarction) ([place]) 03/20/2016   BMS to RCA  . Obesity hypoventilation syndrome ([place])   .  Obesity, unspecified   . Orthostatic hypotension   . OSA on CPAP    8 cm H2O  . OSTEOARTHRITIS   . Paroxysmal atrial fibrillation ([place]) 2003    (? chronic?) Off anticoag for a while due to falls.  Then apixaban started 12/2014.  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 Ella Bodo): neuropathic pain->diff to treat, failed nucynta, failed spinal stimul trial, oxycontin hs + tramadol + gabap as of 12/2017 f/u Dr. Letta Pate.  . Personal history of colonic adenoma 10/30/2012   Diminutive adenoma, consider repeat 2019 per GI  . PFO (patent foramen ovale) 09/2019   small, with predominately L to R shunt  . Pneumonia   . Presence of cardiac defibrillator 11/07/2017  . Primary osteoarthritis of both knees    Bone on bone of medial compartments, + signif patellofemoral arth bilat.--supartz inj series started 09/12/17  . PUD (peptic ulcer disease)   . PULMONARY HYPERTENSION, HX OF   . Secondary male hypogonadism 2017  . 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 Shortness of breath    with exertion: much improved s/p TAVR and treatment for CHF.  . Sick sinus syndrome (HCC)    PPM placed  . Thrombocytopenia ([place]) 2018   HSM on 2007 abd  u/s---suspect some mild splenic sequestration chronically.  [name] Unspecified glaucoma(365.9)   . Unspecified hereditary and idiopathic peripheral neuropathy approx age 63   bilat LE's, ? left arm, too.  Feet became progressively numb + left foot pain intermittently.  Pt may be trying a spinal stimulator (as of 05/2015)  . VENOUS INSUFFICIENCY    Being followed by Dr. Sharol Given as of 10/2016 for two R LL venous stasis ulcers/skin tears.  Healed as of 10/30/16 f/u with Dr. Sharol Given.  . VENTRAL HERNIA   . Wound dehiscence 10/22/2019    Social History   Tobacco Use  . Smoking status: Never Smoker  . Smokeless tobacco: Never Used  Substance Use Topics  . Alcohol use: No    Alcohol/week: 0.0 standard drinks  . Drug use: No    Types: Oxycodone    Family History  Problem Relation Age of Onset  . Hypertension Mother   . Coronary artery disease Mother   . Heart attack Mother   . Neuropathy Mother   . Pulmonary fibrosis Father        asbestosis   Allergies  Allergen Reactions  . Brimonidine Tartrate Shortness Of Breath    Alphagan-Shortness of breath  . Brimonidine Tartrate Palpitations  . Brinzolamide Shortness Of Breath    AZOPT- Shortness of breath  . Latanoprost Shortness Of Breath    XALATAN- Shortness of breath  . Nucynta [Tapentadol] Shortness Of Breath  . Sulfa Antibiotics Palpitations  . Timolol Maleate Shortness Of Breath and Other (See Comments)    TIMOPTIC- Aggravated asthma  . Cardizem  [Diltiazem Hcl] Swelling  . Diltiazem Swelling     leg swelling  . Rofecoxib Swelling     VIOXX- leg swelling  . Vancomycin Hives and Other (See Comments)    Possible "Red Man Syndrome"? > hives/blisters  . Codeine Other (See Comments)    Childhood reaction  . Tamsulosin Other (See Comments)    Dizziness   . Celecoxib Other (See Comments)    CELLBREX-confusion  . Colchicine Diarrhea    diarrhea  . Tape Rash    OBJECTIVE: Blood pressure (!) 105/55, pulse  77, temperature 97.9 F (36.6  C), temperature source Oral, resp. rate 15, weight (!) 144.3 kg, SpO2 98 %.  Physical Exam Constitutional:      Appearance: He is obese.     Comments: Sitting upright in bed, appears comfortable and in no distress.   HENT:     Mouth/Throat:     Mouth: Mucous membranes are moist.     Pharynx: No oropharyngeal exudate.  Eyes:     General: No scleral icterus.    Pupils: Pupils are equal, round, and reactive to light.  Cardiovascular:     Rate and Rhythm: Normal rate.     Heart sounds: Murmur: ?systolic murmur - exam limited with habitus.  Pulmonary:     Effort: Pulmonary effort is normal.     Breath sounds: Normal breath sounds.  Chest:       Comments: Generator pocket well healed incision and no erythema, induration or tenderness.  Abdominal:     General: There is no distension.     Tenderness: There is no abdominal tenderness.  Musculoskeletal:       Feet:  Feet:     Comments: TM site on the left foot well healed. Very small pin hole opening remains; 3 weeks ago this was at least the size of a dime.  Skin:    General: Skin is warm and dry.     Capillary Refill: Capillary refill takes less than 2 seconds.     Coloration: Skin is pale.  Neurological:     Mental Status: He is alert and oriented to person, place, and time.     Lab Results Lab Results  Component Value Date   WBC 8.1 11/28/2019   HGB 9.9 (L) 11/28/2019   HCT 31.2 (L) 11/28/2019   MCV 92.0 11/28/2019   PLT 178 11/28/2019    Lab Results  Component Value Date   CREATININE 0.87 11/28/2019   BUN 15 11/28/2019   NA 138 11/28/2019   K 3.7 11/28/2019   CL 104 11/28/2019   CO2 27 11/28/2019    Lab Results  Component Value Date   ALT 32 11/28/2019   AST 42 (H) 11/28/2019   ALKPHOS 55 11/28/2019   BILITOT 0.9 11/28/2019     Microbiology: Recent Results (from the past 240 hour(s))  Culture, blood (single)     Status: Abnormal (Preliminary result)   Collection Time: 11/26/19  2:40 PM   Specimen:  Blood  Result Value Ref Range Status   MICRO NUMBER: [id number]  Preliminary   SPECIMEN QUALITY: Adequate  Preliminary   Source BLOOD 1  Preliminary   STATUS: PRELIMINARY  Preliminary   ISOLATE 1: Gram positive cocci in chains (AA)  Preliminary    Comment: Gram positive cocci in chains , from aerobic and anaerobic bottles   COMMENT: Aerobic and anaerobic bottle received.  Preliminary  Culture, blood (single)     Status: Abnormal (Preliminary result)   Collection Time: 11/26/19  2:41 PM   Specimen: Blood  Result Value Ref Range Status   MICRO NUMBER: [id number]  Preliminary   SPECIMEN QUALITY: Adequate  Preliminary   Source BLOOD 2  Preliminary   STATUS: PRELIMINARY  Preliminary   ISOLATE 1: Gram positive cocci in chains (AA)  Preliminary    Comment: Gram positive cocci in chains , from aerobic and anaerobic bottles   COMMENT: Aerobic and anaerobic bottle received.  Preliminary  SARS Coronavirus 2 by RT PCR (hospital order, performed in [facility] hospital lab) Nasopharyngeal Nasopharyngeal Swab  Status: None   Collection Time: 11/27/19  5:05 PM   Specimen: Nasopharyngeal Swab  Result Value Ref Range Status   SARS Coronavirus 2 NEGATIVE NEGATIVE Final    Comment: (NOTE) SARS-CoV-2 target nucleic acids are NOT DETECTED. The SARS-CoV-2 RNA is generally detectable in upper and lower respiratory specimens during the acute phase of infection. The lowest concentration of SARS-CoV-2 viral copies this assay can detect is 250 copies / mL. A negative result does not preclude SARS-CoV-2 infection and should not be used as the sole basis for treatment or other patient management decisions.  A negative result may occur with improper specimen collection / handling, submission of specimen other than nasopharyngeal swab, presence of viral mutation(s) within the areas targeted by this assay, and inadequate number of viral copies (<250 copies / mL). A negative result must be combined with clinical  observations, patient history, and epidemiological information. Fact Sheet for Patients:   StrictlyIdeas.no Fact Sheet for Healthcare Providers: BankingDealers.co.za This test is not yet approved or cleared  by the [place] FDA and has been authorized for detection and/or diagnosis of SARS-CoV-2 by FDA under an Emergency Use Authorization (EUA).  This EUA will remain in effect (meaning this test can be used) for the duration of the COVID-19 declaration under Section 564(b)(1) of the Act, 21 U.S.C. section 360bbb-3(b)(1), unless the authorization is terminated or revoked sooner. Performed at [facility], [place] [address] [place], [place] [address]   Culture, blood (routine x 2)     Status: None (Preliminary result)   Collection Time: 11/27/19  6:24 PM   Specimen: BLOOD RIGHT HAND  Result Value Ref Range Status   Specimen Description BLOOD RIGHT HAND  Final   Special Requests   Final    BOTTLES DRAWN AEROBIC AND ANAEROBIC Blood Culture results may not be optimal due to an inadequate volume of blood received in culture bottles   Culture   Final    NO GROWTH < 12 HOURS Performed at [facility], [place] [address], [place], [place] [address]    Report Status PENDING  Incomplete    Janene Madeira, MSN, NP-C [facility] for Infectious Disease [email or web address] Pager: [phone] Office: [phone] [place]: [phone]

## 2019-11-28 NOTE — TOC Initial Note (Addendum)
Transition of Care [facility]) - Initial/Assessment Note    Patient Details  Name: Tim [name] MRN: [id number] Date of Birth: [birth year]  [facility] of [facility]) CM/SW Contact:    Marilu Favre, RN Phone Number: 11/28/2019, 10:06 AM  Clinical Narrative:                 Patient from home with wife. Recently completed a course of IV ABX at home. Patient had [facility] ) and [name].   Provided patient with Medicare.gov list of home health agencies.   Per message plan to discharge to home early next week with IV ABX.   Pam with [facility] following.   Patient has no preference in home health agency.   Called Odin with [facility] , awaiting call back.   Expected Discharge Plan: [place] Barriers to Discharge: Continued Medical Work up   Patient Goals and CMS Choice Patient states their goals for this hospitalization and ongoing recovery are:: to return to home CMS Medicare.gov Compare Post Acute Care list provided to:: Patient Choice offered to / list presented to : Patient  Expected Discharge Plan and Services Expected Discharge Plan: [place]   Discharge Planning Services: CM Consult Post Acute Care Choice: [place] arrangements for the past 2 months: Single Family Home                 DME Arranged: [name]         HH Arranged: RN          Prior Living Arrangements/Services Living arrangements for the past 2 months: Single Family Home Lives with:: Spouse Patient language and need for interpreter reviewed:: Yes Do you feel safe going back to the place where you live?: Yes      Need for Family Participation in Patient Care: Yes (Comment) Care giver support system in place?: Yes (comment)   Criminal Activity/Legal Involvement Pertinent to Current Situation/Hospitalization: No - Comment as needed  Activities of Daily Living      Permission Sought/Granted   Permission granted to  share information with : No              Emotional Assessment Appearance:: Appears stated age Attitude/Demeanor/Rapport: Engaged Affect (typically observed): Accepting Orientation: : Oriented to Self, Oriented to Place, Oriented to  Time, Oriented to Situation Alcohol / Substance Use: Not Applicable Psych Involvement: No (comment)  Admission diagnosis:  Bacteremia [R78.81] Patient Active Problem List   Diagnosis Date Noted  . Bacteremia 11/27/2019  . FUO (fever of unknown origin) 11/26/2019  . Low blood pressure reading 11/05/2019  . Wound dehiscence 10/22/2019  . ICD (implantable cardioverter-defibrillator) battery depletion   . Pressure injury of skin 09/13/2019  . Enterococcal bacteremia 09/13/2019  . 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  . 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 Urinary retention 09/13/2019  . Phimosis 09/13/2019  . SIRS (systemic inflammatory response syndrome) ([place]) 09/12/2019  . Sepsis ([place]) 09/12/2019  . Enterococcus UTI 09/12/2019  . Cellulitis of foot   . History of transmetatarsal amputation of left foot ([place])   . Subacute osteomyelitis, left ankle and foot ([place])   . 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 Diabetic neuropathy, painful ([place]) 12/26/2016  . Neuropathic pain of foot, left 06/20/2016  . 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 Status post amputation of left great toe ([place]) 06/08/2015  . Spinal  stenosis of lumbar region 02/16/2015  . Hereditary and idiopathic peripheral neuropathy 01/12/2015  . Oral thrush 12/28/2014  . Orthostatic dizziness 05/24/2014  . Paresthesias with subjective weakness 01/30/2014  . Bilateral thigh pain 01/30/2014  . Abdominal wall pain in right upper quadrant 01/22/2014  . Gross hematuria 09/25/2013  . Intertrigo 09/25/2013  . IBS (irritable bowel syndrome) 09/11/2013  . Chronic pain  syndrome 08/06/2013  . CAD (coronary artery disease), native coronary artery 12/25/2012  . Paroxysmal atrial fibrillation ([place]) 12/25/2012  . Moderate aortic stenosis 12/25/2012  . Aortic stenosis 12/24/2012  . Personal history of colonic adenoma 10/30/2012  . Diverticulosis of colon (without mention of hemorrhage) 10/30/2012  . Internal hemorrhoids 10/30/2012  . Change in bowel habits intermittent loose stools 10/30/2012  . Routine health maintenance 06/09/2012  . Hereditary peripheral neuropathy 10/10/2011  . Long term current use of anticoagulant - apixiban 08/16/2010  . Dyspnea on exertion 09/08/2009  . HYPERCHOLESTEROLEMIA-PURE 01/13/2009  . Essential hypertension, benign 01/13/2009  . EDEMA 01/13/2009  . GOUT 01/12/2009  . GLAUCOMA 01/12/2009  . Venous insufficiency of both lower extremities 01/12/2009  . COPD (chronic obstructive pulmonary disease) ([place]) 01/12/2009  . VENTRAL HERNIA 01/12/2009  . OSTEOARTHRITIS 01/12/2009  . ARTHRITIS 01/12/2009  . DEGENERATIVE DISC DISEASE 01/12/2009  . CATARACT, HX OF 01/12/2009  . HEART MURMUR, HX OF 01/12/2009  . BENIGN PROSTATIC HYPERTROPHY, HX OF 01/12/2009  . Obesity 01/17/2007  . OBSTRUCTIVE SLEEP APNEA 01/17/2007  . ASTHMA 01/17/2007  . GERD 01/17/2007  . HALLUX VALGUS, ACQUIRED 01/17/2007  . Pulmonary hypertension ([place]) 01/17/2007   PCP:  Tammi Sou, MD Pharmacy:   [id number] [place]Phone: [phone]: [phone]    Social Determinants of Health (SDOH) Interventions    Readmission Risk Interventions No flowsheet data found.

## 2019-11-28 NOTE — Telephone Encounter (Signed)
Just cancel that one maybe they can do CT in patient

## 2019-11-28 NOTE — Progress Notes (Signed)
Patient yelling out in pain. Stating it is never pain. PRN Percocet offered to patient and he refuses. Will continue to monitor.

## 2019-11-28 NOTE — Progress Notes (Signed)
    [facility] has been requested to perform a transesophageal echocardiogram on Haynes Hoehn for bacteremia.  After careful review of history and examination, the risks and benefits of transesophageal echocardiogram have been explained including risks of esophageal damage, perforation (1:10,000 risk), bleeding, pharyngeal hematoma as well as other potential complications associated with conscious sedation including aspiration, arrhythmia, respiratory failure and death. Alternatives to treatment were discussed, questions were answered. Patient is willing to proceed.   Mirian Casco Ninfa Meeker, PA-C  11/28/2019 2:46 PM

## 2019-11-28 NOTE — Progress Notes (Signed)
Pt states when he is ready, he will place himself on CPAP. Instructed pt to call if he needed any assistance.

## 2019-11-29 LAB — BASIC METABOLIC PANEL
Anion gap: 10 (ref 5–15)
BUN: 11 mg/dL (ref 8–23)
CO2: 25 mmol/L (ref 22–32)
Calcium: 8.8 mg/dL — ABNORMAL LOW (ref 8.9–10.3)
Chloride: 105 mmol/L (ref 98–111)
Creatinine, Ser: 0.68 mg/dL (ref 0.61–1.24)
GFR calc Af Amer: >60 mL/min (ref >60)
GFR calc non Af Amer: >60 mL/min (ref >60)
Glucose, Bld: 127 mg/dL — ABNORMAL HIGH (ref 70–99)
Potassium: 4.5 mmol/L (ref 3.5–5.1)
Sodium: 140 mmol/L (ref 135–145)

## 2019-11-29 LAB — CBC
HCT: 32.7 % — ABNORMAL LOW (ref 39.0–52.0)
Hemoglobin: 10.6 g/dL — ABNORMAL LOW (ref 13.0–17.0)
MCH: 29.3 pg (ref 26.0–34.0)
MCHC: 32.4 g/dL (ref 30.0–36.0)
MCV: 90.3 fL (ref 80.0–100.0)
Platelets: 230 10*3/uL (ref 150–400)
RBC: 3.62 MIL/uL — ABNORMAL LOW (ref 4.22–5.81)
RDW: 15.1 % (ref 11.5–15.5)
WBC: 7.2 10*3/uL (ref 4.0–10.5)
nRBC: 0 % (ref 0.0–0.2)

## 2019-11-29 NOTE — Progress Notes (Signed)
Patient stated he would place himself on CPAP when he is ready. RT instructed patient to have RT called if assistance is needed. RT will monitor as needed.

## 2019-11-29 NOTE — Progress Notes (Signed)
PROGRESS NOTE    Tim [name]  ZYS:[id number] DOB: October 17, 1940 DOA: 11/27/2019 PCP: Tammi Sou, MD    No chief complaint on file.   Brief Narrative: HPI per Dr. Jodene Nam is a 79 y.o. 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 -Subsequently underwent dorsal slit procedure by Dr. Tresa Moore on 4/28 for severe phimosis with buried penis, had a Foley catheter thereafter, Foley catheter was removed about a week ago in urology clinic. -He is chronically relatively debilitated and spends most of his time in a recliner, is able to ambulate a little using a walker at home. -He reports weakness dizziness and fevers off and on for the past 2 weeks, temperatures as high as 102, he was seen in the infectious disease clinic yesterday with the symptoms and had blood cultures drawn, he was asked to start oral Augmentin of which she took 2 doses, Dr. Tommy Medal was concerned about the possibility of bacteremia and/or endocarditis. -His blood cultures today grew gram-positive cocci in chains and hence was asked to come back to the emergency room. -He denies any pain swelling or discomfort at his recent amputation site -He was ordered to get repeat blood cultures and given a dose of daptomycin per ID recommendations  Assessment & Plan:   Principal Problem:   Gram-positive bacteremia Active  Problems:   GOUT   Obesity   Essential hypertension, benign   COPD (chronic obstructive pulmonary disease) ([place])   Pulmonary hypertension ([place])   Hereditary peripheral neuropathy   Aortic stenosis   Paroxysmal atrial fibrillation ([facility])   Chronic pain syndrome   Enterococcal bacteremia history    ICD (implantable cardioverter-defibrillator) in place   S/P TAVR (transcatheter aortic valve replacement)  1 Enterococcus faecalis bacteremia/presumed endocarditis Patient treated for enterococcal bacteremia with presumed urinary source as well as deep tissue infection of the left foot which he completed.  Noted over the past 1-2 weeks to be sluggish with low-grade fevers.  Patient seen in ID clinic on 11/26/2019 with repeat blood cultures concerning for gram-positive cocci and subsequently admitted back to the hospital for antibiotic treatment and evaluation for endocarditis.  Patient received a dose of IV daptomycin.  Preliminary cultures from 11/26/2019 + for Enterococcus faecalis.  Repeat blood cultures pending.  Patient with history of TAVR.  Cardiology consulted by ID for evaluation for TEE to rule out endocarditis which will be done early next week.  IV daptomycin has been narrowed to IV ampicillin IV Rocephin per ID recommendations. ID following and I appreciate the input and recommendations.  2.  Coronary artery disease/ischemic cardiomyopathy EF 30%/status post TAVR and AICD/pulmonary hypertension Currently euvolemic.  Continue aspirin, Zetia, Toprol-XL, Crestor, Entresto.  Follow.  Could likely resume torsemide in the next 1 to 2 days if blood pressure remained stable.    3.  COPD Stable.  Continue Dulera.  Nebs as needed.  4.  Chronic pain/peripheral neuropathy Continue home regimen Neurontin.  5.  Morbid obesity  6.  Hypertension Continue current regimen of Toprol-XL, Entresto.  Continue to hold torsemide.  Follow.  7.  History of gout Allopurinol.   8.  Paroxysmal atrial  fibrillation Continue Toprol-XL for rate control.  On full dose Lovenox for anticoagulation.     DVT prophylaxis: Lovenox Code Status: DNR Family Communication: Updated patient.  No family at bedside. Disposition:   Status is: Inpatient    Dispo: The patient is from: Home              Anticipated d/c is to: Home              Anticipated d/c date is: Next week after TEE is done, repeat blood cultures negative and when cleared by ID.              Patient currently on IV antibiotics for bacteremia, will need a TEE that will be done early next week to rule out endocarditis, ID following.        Consultants:   ID Snider: 11/28/2019  Procedures:   Chest x-ray 11/27/2019    Antimicrobials:   IV daptomycin 11/27/2019>>>> 11/28/2019  IV Rocephin 11/28/2019  IV ampicillin 11/28/2019     Subjective: Sleeping in recliner.  Easily arousable.  Denies any chest pain no shortness of breath.    Objective: Vitals:   11/28/19 0453 11/28/19 1600 11/28/19 2113 11/29/19 0425  BP: (!) 105/55 (!) 118/49 (!) 117/52 120/87  Pulse: 77 84 77 80  Resp: 15 18 17 17   Temp: 97.9 F (36.6 C) 98.1 F (36.7 C) 98.9 F (37.2 C) 99 F (37.2 C)  TempSrc: Oral Oral Oral Oral  SpO2: 98% 98% 98% 97%  Weight:        Intake/Output Summary (Last 24 hours) at 11/29/2019 1028 Last data filed at 11/29/2019 0426 Gross per 24 hour  Intake 960 ml  Output 500 ml  Net 460 ml   Filed Weights   11/27/19 1600 11/27/19 2242  Weight: (!) 144.2 kg (!) 144.3 kg    Examination:  General exam: NAD Respiratory system: CTAB.  Respiratory effort normal. Cardiovascular system: Regular rate and rhythm no murmurs rubs or gallops.  No JVD.  No lower extremity edema.  Gastrointestinal system: Abdomen is soft, nontender, nondistended, positive bowel sounds.  No rebound.  No guarding.   Central nervous system: Alert and oriented. No focal neurological deficits. Extremities: Status post left transmetatarsal  amputation.  Skin: No rashes, lesions or ulcers Psychiatry: Judgement and insight appear normal. Mood & affect appropriate.     Data Reviewed: I have personally reviewed following labs and imaging studies  CBC: Recent Labs  Lab 11/26/19 1555 11/27/19 1310 11/28/19 0546  WBC 7.7 9.5 8.1  NEUTROABS 5.9 7.6  --   HGB 11.1* 11.3* 9.9*  HCT 35.6* 35.6* 31.2*  MCV 93.2 90.8 92.0  PLT 222 212 008    Basic Metabolic Panel: Recent Labs  Lab 11/26/19 1555 11/27/19 1310 11/28/19 0546  NA 139 137 138  K 4.3 4.0 3.7  CL 103 103 104  CO2 24 21* 27  GLUCOSE 154* 139* 118*  BUN 10 11 15   CREATININE 0.78 0.88 0.87  CALCIUM 8.8* 8.9 8.8*    GFR: Estimated Creatinine Clearance: 99.9 mL/min (by C-G formula based on SCr of 0.87 mg/dL).  Liver Function Tests: Recent Labs  Lab 11/26/19 1555 11/27/19 1310 11/28/19 0546  AST 30 34 42*  ALT 31 31 32  ALKPHOS 67 67 55  BILITOT 0.8 0.9 0.9  PROT 6.0* 6.7 5.8*  ALBUMIN 2.9* 2.9* 2.5*    CBG: No results for input(s): GLUCAP in the last 168 hours.   Recent Results (from the past 240 hour(s))  Culture, blood (single)     Status: Abnormal (Preliminary result)   Collection Time: 11/26/19  2:40 PM   Specimen: Blood  Result Value Ref Range Status   MICRO NUMBER: [id number]  Preliminary   SPECIMEN QUALITY: Adequate  Preliminary   Source BLOOD 1  Preliminary   STATUS: PRELIMINARY  Preliminary   ISOLATE 1: Enterococcus faecalis (AA)  Preliminary    Comment: Enterococcus faecalis , from aerobic and anaerobic bottles , susceptibility test report to follow.   COMMENT: Aerobic and anaerobic bottle received.  Preliminary  Culture, blood (single)     Status: Abnormal (Preliminary result)   Collection Time: 11/26/19  2:41 PM   Specimen: Blood  Result Value Ref Range Status   MICRO NUMBER: [id number]  Preliminary   SPECIMEN QUALITY: Adequate  Preliminary   Source BLOOD 2  Preliminary   STATUS: PRELIMINARY  Preliminary   ISOLATE 1:  Enterococcus faecalis (AA)  Preliminary    Comment: Enterococcus faecalis , from aerobic and anaerobic bottles , susceptibility test report to follow.   COMMENT: Aerobic and anaerobic bottle received.  Preliminary  SARS Coronavirus 2 by RT PCR (hospital order, performed in [facility] hospital lab) Nasopharyngeal Nasopharyngeal Swab     Status: None   Collection Time: 11/27/19  5:05 PM   Specimen: Nasopharyngeal Swab  Result Value Ref Range Status   SARS Coronavirus 2 NEGATIVE NEGATIVE Final    Comment: (NOTE) SARS-CoV-2 target nucleic acids are NOT DETECTED. The SARS-CoV-2 RNA is generally detectable in upper and lower respiratory specimens during the acute phase of infection. The lowest concentration of SARS-CoV-2 viral copies this assay can detect is 250 copies / mL. A negative result does not preclude SARS-CoV-2 infection and should not be used as the sole basis for treatment or other patient management decisions.  A negative result may occur with improper specimen collection / handling, submission of specimen other than nasopharyngeal swab, presence of viral mutation(s) within the areas targeted by this assay, and inadequate number of viral copies (<250 copies / mL). A negative result must be combined with clinical observations, patient history, and epidemiological information. Fact Sheet for Patients:   StrictlyIdeas.no Fact Sheet for Healthcare Providers: BankingDealers.co.za This test is not yet approved or cleared  by the [place] FDA and has been authorized for detection and/or diagnosis of SARS-CoV-2 by FDA under an Emergency Use Authorization (EUA).  This EUA will remain in effect (meaning this test can be used) for the duration of the COVID-19 declaration under Section 564(b)(1) of the Act, 21 U.S.C. section 360bbb-3(b)(1), unless the authorization is terminated or revoked sooner. Performed at [facility],  [place] [address] [place], [place] [address]   Culture, blood (routine x 2)     Status: None (Preliminary result)   Collection Time: 11/27/19  6:24 PM   Specimen: BLOOD RIGHT HAND  Result Value Ref Range Status   Specimen Description BLOOD RIGHT HAND  Final   Special Requests   Final    BOTTLES DRAWN AEROBIC AND ANAEROBIC Blood Culture results may not be optimal due to an inadequate volume of blood received in culture bottles   Culture   Final    NO GROWTH 2 DAYS Performed at [facility], [place] [address] [place], [place]  [address]    Report Status PENDING  Incomplete  Culture, blood (routine x 2)     Status: None (Preliminary result)   Collection Time: 11/28/19  5:46 AM   Specimen: BLOOD  Result Value Ref Range Status   Specimen Description BLOOD LEFT ANTECUBITAL  Final   Special Requests   Final    BOTTLES DRAWN AEROBIC ONLY Blood Culture adequate volume   Culture   Final    NO GROWTH 1 DAY Performed at [facility], [place] [address] [place], [place] [address]    Report Status PENDING  Incomplete         Radiology Studies: DG Chest Port 1 View  Result Date: 11/27/2019 CLINICAL DATA:  Fever EXAM: PORTABLE CHEST 1 VIEW COMPARISON:  09/12/2019 FINDINGS: Pacer. Midline trachea. Mild cardiomegaly. Atherosclerosis in the transverse aorta. No pleural effusion or pneumothorax. No congestive failure. Clear lungs. IMPRESSION: Cardiomegaly, without congestive failure or acute disease. Aortic Atherosclerosis (ICD10-I70.0). Electronically Signed   By: Abigail Miyamoto M.D.   On: 11/27/2019 18:29        Scheduled Meds: . allopurinol  300 mg Oral Daily  . aspirin EC  81 mg Oral Daily  . Chlorhexidine Gluconate Cloth  6 each Topical Daily  . enoxaparin (LOVENOX) injection  1 mg/kg Subcutaneous Q12H  . ezetimibe  10 mg Oral Daily  . finasteride  5 mg Oral Daily  . gabapentin  800 mg Oral QID  . metoprolol succinate  50 mg Oral Daily  . mometasone-formoterol  2 puff Inhalation BID   . niacin  1,000 mg Oral BID  . pantoprazole  40 mg Oral Daily  . rosuvastatin  40 mg Oral Daily  . sacubitril-valsartan  0.5 tablet Oral BID  . senna-docusate  1 tablet Oral BID   Continuous Infusions: . ampicillin (OMNIPEN) IV 2 g (11/29/19 0833)  . cefTRIAXone (ROCEPHIN)  IV 2 g (11/29/19 0525)     LOS: 2 days    Time spent: 35 minutes    Irine Seal, MD [facility]   To contact the attending provider between 7A-7P or the covering provider during after hours 7P-7A, please log into the web site [email or web address] and access using universal  password for that web site. If you do not have the password, please call the hospital operator.  11/29/2019, 10:28 AM

## 2019-11-30 LAB — CBC WITH DIFFERENTIAL/PLATELET
Abs Immature Granulocytes: 0.03 10*3/uL (ref 0.00–0.07)
Basophils Absolute: 0 10*3/uL (ref 0.0–0.1)
Basophils Relative: 1 %
Eosinophils Absolute: 0.1 10*3/uL (ref 0.0–0.5)
Eosinophils Relative: 2 %
HCT: 32.8 % — ABNORMAL LOW (ref 39.0–52.0)
Hemoglobin: 10.2 g/dL — ABNORMAL LOW (ref 13.0–17.0)
Immature Granulocytes: 0 %
Lymphocytes Relative: 20 %
Lymphs Abs: 1.5 10*3/uL (ref 0.7–4.0)
MCH: 29 pg (ref 26.0–34.0)
MCHC: 31.1 g/dL (ref 30.0–36.0)
MCV: 93.2 fL (ref 80.0–100.0)
Monocytes Absolute: 0.9 10*3/uL (ref 0.1–1.0)
Monocytes Relative: 13 %
Neutro Abs: 4.6 10*3/uL (ref 1.7–7.7)
Neutrophils Relative %: 64 %
Platelets: 165 10*3/uL (ref 150–400)
RBC: 3.52 MIL/uL — ABNORMAL LOW (ref 4.22–5.81)
RDW: 15.2 % (ref 11.5–15.5)
WBC: 7.1 10*3/uL (ref 4.0–10.5)
nRBC: 0 % (ref 0.0–0.2)

## 2019-11-30 LAB — BASIC METABOLIC PANEL
Anion gap: 9 (ref 5–15)
BUN: 9 mg/dL (ref 8–23)
CO2: 24 mmol/L (ref 22–32)
Calcium: 8.6 mg/dL — ABNORMAL LOW (ref 8.9–10.3)
Chloride: 107 mmol/L (ref 98–111)
Creatinine, Ser: 0.7 mg/dL (ref 0.61–1.24)
GFR calc Af Amer: >60 mL/min (ref >60)
GFR calc non Af Amer: >60 mL/min (ref >60)
Glucose, Bld: 111 mg/dL — ABNORMAL HIGH (ref 70–99)
Potassium: 3.5 mmol/L (ref 3.5–5.1)
Sodium: 140 mmol/L (ref 135–145)

## 2019-11-30 LAB — URINE CULTURE: Culture: <10000 — AB

## 2019-11-30 MED ORDER — POTASSIUM CHLORIDE CRYS ER 20 MEQ PO TBCR
40.0000 meq | EXTENDED_RELEASE_TABLET | Freq: Once | ORAL | Status: AC
  Administered 2019-11-30: 40 meq via ORAL
  Filled 2019-11-30: qty 2

## 2019-11-30 MED ORDER — METOPROLOL SUCCINATE ER 25 MG PO TB24: 12.5000 mg | ORAL_TABLET | Freq: Every day | ORAL | Status: DC

## 2019-11-30 NOTE — Progress Notes (Signed)
PROGRESS NOTE    Tim [name]  HKV:[id number] DOB: 05-Aug-1940 DOA: 11/27/2019 PCP: Tammi Sou, MD    No chief complaint on file.   Brief Narrative: HPI per Dr. Jodene Nam is a 79 y.o. 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 -Subsequently underwent dorsal slit procedure by Dr. Tresa Moore on 4/28 for severe phimosis with buried penis, had a Foley catheter thereafter, Foley catheter was removed about a week ago in urology clinic. -He is chronically relatively debilitated and spends most of his time in a recliner, is able to ambulate a little using a walker at home. -He reports weakness dizziness and fevers off and on for the past 2 weeks, temperatures as high as 102, he was seen in the infectious disease clinic yesterday with the symptoms and had blood cultures drawn, he was asked to start oral Augmentin of which she took 2 doses, Dr. Tommy Medal was concerned about the possibility of bacteremia and/or endocarditis. -His blood cultures today grew gram-positive cocci in chains and hence was asked to come back to the emergency room. -He denies any pain swelling or discomfort at his recent amputation site -He was ordered to get repeat blood cultures and given a dose of daptomycin per ID recommendations  Assessment & Plan:   Principal Problem:   Gram-positive bacteremia Active  Problems:   GOUT   Obesity   Essential hypertension, benign   COPD (chronic obstructive pulmonary disease) ([place])   Pulmonary hypertension ([place])   Hereditary peripheral neuropathy   Aortic stenosis   Paroxysmal atrial fibrillation ([facility])   Chronic pain syndrome   Enterococcal bacteremia history    ICD (implantable cardioverter-defibrillator) in place   S/P TAVR (transcatheter aortic valve replacement)  1 Enterococcus faecalis bacteremia/presumed endocarditis Patient treated for enterococcal bacteremia with presumed urinary source as well as deep tissue infection of the left foot which he completed.  Noted over the past 1-2 weeks to be sluggish with low-grade fevers.  Patient seen in ID clinic on 11/26/2019 with repeat blood cultures concerning for gram-positive cocci and subsequently admitted back to the hospital for antibiotic treatment and evaluation for endocarditis.  Patient received a dose of IV daptomycin.  Preliminary cultures from 11/26/2019 + for Enterococcus faecalis.  Repeat blood cultures pending.  Patient with history of TAVR.  Cardiology consulted by ID for evaluation for TEE to rule out endocarditis which will be done early next week.  IV daptomycin has been narrowed to IV ampicillin IV Rocephin per ID recommendations. ID following and I appreciate the input and recommendations.  2.  Coronary artery disease/ischemic cardiomyopathy EF 30%/status post TAVR and AICD/pulmonary hypertension Currently euvolemic.  Continue aspirin, Zetia,Crestor, Entresto.  Patient states had taken himself off his beta-blocker due to low blood pressure.  Discontinue Toprol-XL as blood pressure soft.  Follow.   3.  COPD Stable.  Dulera.  Nebs as needed.   4.  Chronic pain/peripheral neuropathy Continue home regimen Neurontin.  5.  Morbid obesity  6.  Hypertension Blood pressure borderline.  Blood pressure this morning of 94/50.  Patient states was off his beta-blocker prior to admission due to low  blood pressure and told his ID doctor did not want to be on Toprol-XL.  We will discontinue Toprol-XL due to borderline/low blood pressure.  Continue Entresto.  Follow.   7.  History of gout Allopurinol.   8.  Paroxysmal atrial fibrillation Patient noted to have taken himself off Toprol-XL due to low blood pressure and states blood pressure was low this morning.  Patient also told ID did not want to be on a beta-blocker due to low blood pressure.  Will discontinue Toprol-XL for now and monitor heart rate off of beta-blocker.  On full dose Lovenox for anticoagulation.     DVT prophylaxis: Lovenox Code Status: DNR Family Communication: Updated patient.  No family at bedside. Disposition:   Status is: Inpatient    Dispo: The patient is from: Home              Anticipated d/c is to: Home              Anticipated d/c date is: Next week after TEE is done, repeat blood cultures negative and when cleared by ID.              Patient currently on IV antibiotics for bacteremia, will need a TEE that will be done early next week to rule out endocarditis, ID following.        Consultants:   ID Snider: 11/28/2019  Procedures:   Chest x-ray 11/27/2019    Antimicrobials:   IV daptomycin 11/27/2019>>>> 11/28/2019  IV Rocephin 11/28/2019  IV ampicillin 11/28/2019     Subjective: Laying in bed.  Denies chest pain or shortness of breath.  States had taken himself off his beta-blocker due to low blood pressure and was to follow-up with his cardiologist in the outpatient setting.     Objective: Vitals:   11/29/19 2029 11/29/19 2032 11/30/19 0439 11/30/19 1103  BP:   (!) 117/48   Pulse:  79 75 81  Resp:  16 18 16   Temp:   98.4 F (36.9 C)   TempSrc:   Oral   SpO2: 97% 97% 98% 97%  Weight:        Intake/Output Summary (Last 24 hours) at 11/30/2019 1209 Last data filed at 11/29/2019 1330 Gross per 24 hour  Intake 0 ml  Output --  Net 0 ml   Filed Weights   11/27/19 1600  11/27/19 2242  Weight: (!) 144.2 kg (!) 144.3 kg    Examination:  General exam: NAD Respiratory system: Lungs clear to auscultation bilaterally.  No wheezes, no crackles, no rhonchi.  Normal respiratory effort.  Cardiovascular system: RRR no murmurs rubs or gallops.  No JVD.  No lower extremity edema.  Gastrointestinal system: Abdomen is nontender, nondistended, soft, positive bowel sounds.  No rebound.  No guarding.  Central nervous system: Alert and oriented. No focal neurological deficits. Extremities: Status post left transmetatarsal amputation.  Skin: Chronic venous stasis changes noted bilateral lower extremities.  Psychiatry: Judgement and insight appear normal. Mood & affect appropriate.     Data Reviewed: I have personally reviewed following labs and imaging studies  CBC: Recent Labs  Lab 11/26/19 1555 11/27/19 1310 11/28/19 0546 11/29/19 1159 11/30/19 0308  WBC 7.7 9.5 8.1 7.2 7.1  NEUTROABS 5.9 7.6  --   --  4.6  HGB 11.1* 11.3* 9.9* 10.6*  10.2*  HCT 35.6* 35.6* 31.2* 32.7* 32.8*  MCV 93.2 90.8 92.0 90.3 93.2  PLT 222 212 178 230 500    Basic Metabolic Panel: Recent Labs  Lab 11/26/19 1555 11/27/19 1310 11/28/19 0546 11/29/19 1159 11/30/19 0308  NA 139 137 138 140 140  K 4.3 4.0 3.7 4.5 3.5  CL 103 103 104 105 107  CO2 24 21* 27 25 24   GLUCOSE 154* 139* 118* 127* 111*  BUN 10 11 15 11 9   CREATININE 0.78 0.88 0.87 0.68 0.70  CALCIUM 8.8* 8.9 8.8* 8.8* 8.6*    GFR: Estimated Creatinine Clearance: 108.6 mL/min (by C-G formula based on SCr of 0.7 mg/dL).  Liver Function Tests: Recent Labs  Lab 11/26/19 1555 11/27/19 1310 11/28/19 0546  AST 30 34 42*  ALT 31 31 32  ALKPHOS 67 67 55  BILITOT 0.8 0.9 0.9  PROT 6.0* 6.7 5.8*  ALBUMIN 2.9* 2.9* 2.5*    CBG: No results for input(s): GLUCAP in the last 168 hours.   Recent Results (from the past 240 hour(s))  Culture, blood (single)     Status: Abnormal   Collection Time: 11/26/19  2:40 PM    Specimen: Blood  Result Value Ref Range Status   MICRO NUMBER: [id number]  Final   SPECIMEN QUALITY: Adequate  Final   Source BLOOD 1  Final   STATUS: FINAL  Final   ISOLATE 1: Enterococcus faecalis (A)  Final    Comment: Enterococcus faecalis , from aerobic and anaerobic bottles   COMMENT: Aerobic and anaerobic bottle received.  Final      Susceptibility   Enterococcus faecalis - BLOOD CULTURE, POSITIVE 1    AMPICILLIN <=2 Sensitive     VANCOMYCIN 1 Sensitive     GENTAMICIN SYNERGY*  Sensitive      * This organism does not exhibit high levelresistance to gentamicin which indicates thatsynergy of gentamicin with a penicillin is likely.Legend:S = Susceptible  I = IntermediateR = Resistant  NS = Not susceptible* = Not tested  NR = Not reported**NN = See antimicrobic comments  Culture, blood (single)     Status: Abnormal   Collection Time: 11/26/19  2:41 PM   Specimen: Blood  Result Value Ref Range Status   MICRO NUMBER: [id number]  Final   SPECIMEN QUALITY: Adequate  Final   Source BLOOD 2  Final   STATUS: FINAL  Final   ISOLATE 1: Enterococcus faecalis (A)  Final    Comment: Enterococcus faecalis , from aerobic and anaerobic bottles   COMMENT: Aerobic and anaerobic bottle received.  Final      Susceptibility   Enterococcus faecalis - BLOOD CULTURE, POSITIVE 1    AMPICILLIN <=2 Sensitive     VANCOMYCIN 1 Sensitive     GENTAMICIN SYNERGY*  Sensitive      * This organism does not exhibit high levelresistance to gentamicin which indicates thatsynergy of gentamicin with a penicillin is likely.Legend:S = Susceptible  I = IntermediateR = Resistant  NS = Not susceptible* = Not tested  NR = Not reported**NN = See antimicrobic comments  SARS Coronavirus 2 by RT PCR (hospital order, performed in [place] hospital lab) Nasopharyngeal Nasopharyngeal Swab     Status: None   Collection Time: 11/27/19  5:05 PM   Specimen: Nasopharyngeal Swab  Result Value Ref Range Status   SARS Coronavirus 2  NEGATIVE NEGATIVE Final    Comment: (NOTE) SARS-CoV-2 target nucleic acids are NOT DETECTED. The SARS-CoV-2 RNA is generally detectable in upper  and lower respiratory specimens during the acute phase of infection. The lowest concentration of SARS-CoV-2 viral copies this assay can detect is 250 copies / mL. A negative result does not preclude SARS-CoV-2 infection and should not be used as the sole basis for treatment or other patient management decisions.  A negative result may occur with improper specimen collection / handling, submission of specimen other than nasopharyngeal swab, presence of viral mutation(s) within the areas targeted by this assay, and inadequate number of viral copies (<250 copies / mL). A negative result must be combined with clinical observations, patient history, and epidemiological information. Fact Sheet for Patients:   StrictlyIdeas.no Fact Sheet for Healthcare Providers: BankingDealers.co.za This test is not yet approved or cleared  by the [place] FDA and has been authorized for detection and/or diagnosis of SARS-CoV-2 by FDA under an Emergency Use Authorization (EUA).  This EUA will remain in effect (meaning this test can be used) for the duration of the COVID-19 declaration under Section 564(b)(1) of the Act, 21 U.S.C. section 360bbb-3(b)(1), unless the authorization is terminated or revoked sooner. Performed at [facility], [place] [address], [place], [place] [address]   Culture, blood (routine x 2)     Status: None (Preliminary result)   Collection Time: 11/27/19  6:24 PM   Specimen: BLOOD RIGHT HAND  Result Value Ref Range Status   Specimen Description BLOOD RIGHT HAND  Final   Special Requests   Final    BOTTLES DRAWN AEROBIC AND ANAEROBIC Blood Culture results may not be optimal due to an inadequate volume of blood received in culture bottles   Culture   Final    NO GROWTH 3 DAYS Performed  at [facility], [place] [address], [place], [place] [address]    Report Status PENDING  Incomplete  Culture, blood (routine x 2)     Status: None (Preliminary result)   Collection Time: 11/28/19  5:46 AM   Specimen: BLOOD  Result Value Ref Range Status   Specimen Description BLOOD LEFT ANTECUBITAL  Final   Special Requests   Final    BOTTLES DRAWN AEROBIC ONLY Blood Culture adequate volume   Culture   Final    NO GROWTH 2 DAYS Performed at [facility], [place] [address] [place],  [address]    Report Status PENDING  Incomplete  Urine Culture     Status: Abnormal   Collection Time: 11/29/19  3:27 AM   Specimen: Urine, Random  Result Value Ref Range Status   Specimen Description URINE, RANDOM  Final   Special Requests NONE  Final   Culture (A)  Final    <10,000 COLONIES/mL INSIGNIFICANT GROWTH Performed at [facility], [place] [address] [place],  [address]    Report Status 11/30/2019 FINAL  Final         Radiology Studies: No results found.      Scheduled Meds: . allopurinol  300 mg Oral Daily  . aspirin EC  81 mg Oral Daily  . Chlorhexidine Gluconate Cloth  6 each Topical Daily  . enoxaparin (LOVENOX) injection  1 mg/kg Subcutaneous Q12H  . ezetimibe  10 mg Oral Daily  . finasteride  5 mg Oral Daily  . gabapentin  800 mg Oral QID  . metoprolol succinate  50 mg Oral Daily  . mometasone-formoterol  2 puff Inhalation BID  . niacin  1,000 mg Oral BID  . pantoprazole  40 mg Oral Daily  . rosuvastatin  40 mg Oral Daily  . sacubitril-valsartan  0.5 tablet Oral BID  . senna-docusate  1 tablet Oral BID   Continuous Infusions: . ampicillin (OMNIPEN) IV 2 g (11/30/19 1204)  . cefTRIAXone (ROCEPHIN)  IV 2 g (11/30/19 0359)     LOS: 3 days    Time spent: 35 minutes    Irine Seal, MD [facility]   To contact the attending provider between 7A-7P or the covering provider during after hours 7P-7A, please log into the web site  [email or web address] and access using universal [place] password for that web site. If you do not have the password, please call the hospital operator.  11/30/2019, 12:09 PM

## 2019-11-30 NOTE — Progress Notes (Signed)
Placed patient on CPAP set at 8cm for the night.

## 2019-11-30 NOTE — Progress Notes (Signed)
Subjective:  " I do not want to be on the metoprolol I myself off of it because my blood pressure was too low"  Antibiotics:  Anti-infectives (From admission, onward)   Start     Dose/Rate Route Frequency Ordered Stop   11/28/19 1600  cefTRIAXone (ROCEPHIN) 2 g in sodium chloride 0.9 % 100 mL IVPB     2 g 200 mL/hr over 30 Minutes Intravenous Every 12 hours 11/28/19 1333     11/28/19 1345  ampicillin (OMNIPEN) 2 g in sodium chloride 0.9 % 100 mL IVPB     2 g 300 mL/hr over 20 Minutes Intravenous Every 4 hours 11/28/19 1333     11/27/19 1730  DAPTOmycin (CUBICIN) 1,000 mg in sodium chloride 0.9 % IVPB  Status:  Discontinued     1,000 mg 240 mL/hr over 30 Minutes Intravenous Daily 11/27/19 1655 11/28/19 1333   11/27/19 1700  DAPTOmycin (CUBICIN) 833.5 mg in sodium chloride 0.9 % IVPB  Status:  Discontinued     8 mg/kg  104.2 kg (Adjusted) 233.3 mL/hr over 30 Minutes Intravenous Daily 11/27/19 1657 11/27/19 1658      Medications: Scheduled Meds: . allopurinol  300 mg Oral Daily  . aspirin EC  81 mg Oral Daily  . Chlorhexidine Gluconate Cloth  6 each Topical Daily  . enoxaparin (LOVENOX) injection  1 mg/kg Subcutaneous Q12H  . ezetimibe  10 mg Oral Daily  . finasteride  5 mg Oral Daily  . gabapentin  800 mg Oral QID  . [START ON 12/01/2019] metoprolol succinate  12.5 mg Oral Daily  . mometasone-formoterol  2 puff Inhalation BID  . niacin  1,000 mg Oral BID  . pantoprazole  40 mg Oral Daily  . rosuvastatin  40 mg Oral Daily  . sacubitril-valsartan  0.5 tablet Oral BID  . senna-docusate  1 tablet Oral BID   Continuous Infusions: . ampicillin (OMNIPEN) IV 2 g (11/30/19 1204)  . cefTRIAXone (ROCEPHIN)  IV 2 g (11/30/19 0359)   PRN Meds:.acetaminophen **OR** acetaminophen, albuterol, fluticasone, nitroGLYCERIN, ondansetron **OR** ondansetron (ZOFRAN) IV, oxyCODONE-acetaminophen    Objective: Weight change:  No intake or output data in the 24 hours ending 11/30/19  1352 Blood pressure (!) 117/48, pulse 81, temperature 98.4 F (36.9 C), temperature source Oral, resp. rate 16, weight (!) 144.3 kg, SpO2 97 %. Temp:  [98.3 F (36.8 C)-98.8 F (37.1 C)] 98.4 F (36.9 C) (05/30 0439) Pulse Rate:  [75-81] 81 (05/30 1103) Resp:  [16-18] 16 (05/30 1103) BP: (94-118)/(48-52) 117/48 (05/30 0439) SpO2:  [97 %-98 %] 97 % (05/30 1103)  Physical Exam: General: Fully asleep but arousable and oriented person and place and location,  HEENT: anicteric sclera, EOMI CVS regular rate, normal no mgr Chest: , no wheezing, no respiratory distress Abdomen: soft non-distended,  Extremities: amputation site not examined Skin: no rashes Neuro: nonfocal  CBC:    BMET Recent Labs    11/29/19 1159 11/30/19 0308  NA 140 140  K 4.5 3.5  CL 105 107  CO2 25 24  GLUCOSE 127* 111*  BUN 11 9  CREATININE 0.68 0.70  CALCIUM 8.8* 8.6*     Liver Panel  Recent Labs    11/28/19 0546  PROT 5.8*  ALBUMIN 2.5*  AST 42*  ALT 32  ALKPHOS 55  BILITOT 0.9       Sedimentation Rate No results for input(s): ESRSEDRATE in the last 72 hours. C-Reactive Protein No results for input(s): CRP in  the last 72 hours.  Micro Results: Recent Results (from the past 720 hour(s))  Culture, blood (single)     Status: Abnormal   Collection Time: 11/26/19  2:40 PM   Specimen: Blood  Result Value Ref Range Status   MICRO NUMBER: [id number]  Final   SPECIMEN QUALITY: Adequate  Final   Source BLOOD 1  Final   STATUS: FINAL  Final   ISOLATE 1: Enterococcus faecalis (A)  Final    Comment: Enterococcus faecalis , from aerobic and anaerobic bottles   COMMENT: Aerobic and anaerobic bottle received.  Final      Susceptibility   Enterococcus faecalis - BLOOD CULTURE, POSITIVE 1    AMPICILLIN <=2 Sensitive     VANCOMYCIN 1 Sensitive     GENTAMICIN SYNERGY*  Sensitive      * This organism does not exhibit high levelresistance to gentamicin which indicates thatsynergy of gentamicin  with a penicillin is likely.Legend:S = Susceptible  I = IntermediateR = Resistant  NS = Not susceptible* = Not tested  NR = Not reported**NN = See antimicrobic comments  Culture, blood (single)     Status: Abnormal   Collection Time: 11/26/19  2:41 PM   Specimen: Blood  Result Value Ref Range Status   MICRO NUMBER: [id number]  Final   SPECIMEN QUALITY: Adequate  Final   Source BLOOD 2  Final   STATUS: FINAL  Final   ISOLATE 1: Enterococcus faecalis (A)  Final    Comment: Enterococcus faecalis , from aerobic and anaerobic bottles   COMMENT: Aerobic and anaerobic bottle received.  Final      Susceptibility   Enterococcus faecalis - BLOOD CULTURE, POSITIVE 1    AMPICILLIN <=2 Sensitive     VANCOMYCIN 1 Sensitive     GENTAMICIN SYNERGY*  Sensitive      * This organism does not exhibit high levelresistance to gentamicin which indicates thatsynergy of gentamicin with a penicillin is likely.Legend:S = Susceptible  I = IntermediateR = Resistant  NS = Not susceptible* = Not tested  NR = Not reported**NN = See antimicrobic comments  SARS Coronavirus 2 by RT PCR (hospital order, performed in [place] hospital lab) Nasopharyngeal Nasopharyngeal Swab     Status: None   Collection Time: 11/27/19  5:05 PM   Specimen: Nasopharyngeal Swab  Result Value Ref Range Status   SARS Coronavirus 2 NEGATIVE NEGATIVE Final    Comment: (NOTE) SARS-CoV-2 target nucleic acids are NOT DETECTED. The SARS-CoV-2 RNA is generally detectable in upper and lower respiratory specimens during the acute phase of infection. The lowest concentration of SARS-CoV-2 viral copies this assay can detect is 250 copies / mL. A negative result does not preclude SARS-CoV-2 infection and should not be used as the sole basis for treatment or other patient management decisions.  A negative result may occur with improper specimen collection / handling, submission of specimen other than nasopharyngeal swab, presence of viral mutation(s)  within the areas targeted by this assay, and inadequate number of viral copies (<250 copies / mL). A negative result must be combined with clinical observations, patient history, and epidemiological information. Fact Sheet for Patients:   StrictlyIdeas.no Fact Sheet for Healthcare Providers: BankingDealers.co.za This test is not yet approved or cleared  by the [place] FDA and has been authorized for detection and/or diagnosis of SARS-CoV-2 by FDA under an Emergency Use Authorization (EUA).  This EUA will remain in effect (meaning this test can be used) for the duration of the COVID-19 declaration  under Section 564(b)(1) of the Act, 21 U.S.C. section 360bbb-3(b)(1), unless the authorization is terminated or revoked sooner. Performed at [facility], [place] [address], [place], [place] [address]   Culture, blood (routine x 2)     Status: None (Preliminary result)   Collection Time: 11/27/19  6:24 PM   Specimen: BLOOD RIGHT HAND  Result Value Ref Range Status   Specimen Description BLOOD RIGHT HAND  Final   Special Requests   Final    BOTTLES DRAWN AEROBIC AND ANAEROBIC Blood Culture results may not be optimal due to an inadequate volume of blood received in culture bottles   Culture   Final    NO GROWTH 3 DAYS Performed at [facility], [place] [address], [place], [place] [address]    Report Status PENDING  Incomplete  Culture, blood (routine x 2)     Status: None (Preliminary result)   Collection Time: 11/28/19  5:46 AM   Specimen: BLOOD  Result Value Ref Range Status   Specimen Description BLOOD LEFT ANTECUBITAL  Final   Special Requests   Final    BOTTLES DRAWN AEROBIC ONLY Blood Culture adequate volume   Culture   Final    NO GROWTH 2 DAYS Performed at [facility], [place] [address], [place], [place] [address]    Report Status PENDING  Incomplete  Urine Culture     Status: Abnormal   Collection Time: 11/29/19   3:27 AM   Specimen: Urine, Random  Result Value Ref Range Status   Specimen Description URINE, RANDOM  Final   Special Requests NONE  Final   Culture (A)  Final    <10,000 COLONIES/mL INSIGNIFICANT GROWTH Performed at [facility], [place] [address], [place], [place] [address]    Report Status 11/30/2019 FINAL  Final    Studies/Results: No results found.    Assessment/Plan:  INTERVAL HISTORY: The blood cultures from [facility] came back positive with ampicillin sensitive Enterococcus faecalis   Principal Problem:   Gram-positive bacteremia Active Problems:   GOUT   Obesity   Essential hypertension, benign   COPD (chronic obstructive pulmonary disease) ([facility])   Pulmonary hypertension ([facility])   Hereditary peripheral neuropathy   Aortic stenosis   Paroxysmal atrial fibrillation ([facility])   Chronic pain syndrome   Enterococcal bacteremia history    ICD (implantable cardioverter-defibrillator) in place   S/P TAVR (transcatheter aortic valve replacement)    Tim [name] is a 79 y.o. male with  TAVR and ICD,  Recent Mid foot amputation and enterococcal bacteremia with negative transesophageal echocardiogram status post treatment with IV antibiotics followed by me in clinic who has having episodes of dizziness and fevers.  He has now proven recurrence of his enterococcal bacteremia and I suspect his valve and ICD are both infected.  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 Grandville Silos  Dr. Baxter Flattery  will be back tomorrow  LOS: 3 days   Alcide Evener 11/30/2019, 1:52 PM

## 2019-12-01 LAB — CULTURE, BLOOD (SINGLE)
MICRO NUMBER:: 10523679
MICRO NUMBER:: 10523685
SPECIMEN QUALITY:: ADEQUATE
SPECIMEN QUALITY:: ADEQUATE

## 2019-12-01 LAB — BASIC METABOLIC PANEL
Anion gap: 12 (ref 5–15)
BUN: 7 mg/dL — ABNORMAL LOW (ref 8–23)
CO2: 22 mmol/L (ref 22–32)
Calcium: 8.6 mg/dL — ABNORMAL LOW (ref 8.9–10.3)
Chloride: 108 mmol/L (ref 98–111)
Creatinine, Ser: 0.72 mg/dL (ref 0.61–1.24)
GFR calc Af Amer: >60 mL/min (ref >60)
GFR calc non Af Amer: >60 mL/min (ref >60)
Glucose, Bld: 132 mg/dL — ABNORMAL HIGH (ref 70–99)
Potassium: 3.3 mmol/L — ABNORMAL LOW (ref 3.5–5.1)
Sodium: 142 mmol/L (ref 135–145)

## 2019-12-01 LAB — CBC
HCT: 32 % — ABNORMAL LOW (ref 39.0–52.0)
Hemoglobin: 10 g/dL — ABNORMAL LOW (ref 13.0–17.0)
MCH: 28.9 pg (ref 26.0–34.0)
MCHC: 31.3 g/dL (ref 30.0–36.0)
MCV: 92.5 fL (ref 80.0–100.0)
Platelets: 170 10*3/uL (ref 150–400)
RBC: 3.46 MIL/uL — ABNORMAL LOW (ref 4.22–5.81)
RDW: 15.1 % (ref 11.5–15.5)
WBC: 5.3 10*3/uL (ref 4.0–10.5)
nRBC: 0 % (ref 0.0–0.2)

## 2019-12-01 MED ORDER — FINASTERIDE 5 MG PO TABS
5.0000 mg | ORAL_TABLET | Freq: Every day | ORAL | Status: DC
  Administered 2019-12-02 – 2019-12-06 (×5): 5 mg via ORAL
  Filled 2019-12-01 (×5): qty 1

## 2019-12-01 MED ORDER — POTASSIUM CHLORIDE CRYS ER 20 MEQ PO TBCR
40.0000 meq | EXTENDED_RELEASE_TABLET | Freq: Once | ORAL | Status: AC
  Administered 2019-12-01: 40 meq via ORAL
  Filled 2019-12-01: qty 2

## 2019-12-01 NOTE — Progress Notes (Addendum)
PROGRESS NOTE    Tim [name]  WJX:[id number] DOB: 05/30/1941 DOA: 11/27/2019 PCP: Tammi Sou, MD    No chief complaint on file.   Brief Narrative: HPI per Dr. Jodene Nam is a 79 y.o. 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 -Subsequently underwent dorsal slit procedure by Dr. Tresa Moore on 4/28 for severe phimosis with buried penis, had a Foley catheter thereafter, Foley catheter was removed about a week ago in urology clinic. -He is chronically relatively debilitated and spends most of his time in a recliner, is able to ambulate a little using a walker at home. -He reports weakness dizziness and fevers off and on for the past 2 weeks, temperatures as high as 102, he was seen in the infectious disease clinic yesterday with the symptoms and had blood cultures drawn, he was asked to start oral Augmentin of which she took 2 doses, Dr. Tommy Medal was concerned about the possibility of bacteremia and/or endocarditis. -His blood cultures today grew gram-positive cocci in chains and hence was asked to come back to the emergency room. -He denies any pain swelling or discomfort at his recent amputation site -He was ordered to get repeat blood cultures and given a dose of daptomycin per ID recommendations  Assessment & Plan:   Principal Problem:   Gram-positive bacteremia Active  Problems:   GOUT   Obesity   Essential hypertension, benign   COPD (chronic obstructive pulmonary disease) ([place])   Pulmonary hypertension ([place])   Hereditary peripheral neuropathy   Aortic stenosis   Paroxysmal atrial fibrillation ([facility])   Chronic pain syndrome   Enterococcal bacteremia history    ICD (implantable cardioverter-defibrillator) in place   S/P TAVR (transcatheter aortic valve replacement)  1 Enterococcus faecalis bacteremia/presumed endocarditis Patient treated for enterococcal bacteremia with presumed urinary source as well as deep tissue infection of the left foot which he completed.  Noted over the past 1-2 weeks to be sluggish with low-grade fevers.  Patient seen in ID clinic on 11/26/2019 with repeat blood cultures concerning for gram-positive cocci and subsequently admitted back to the hospital for antibiotic treatment and evaluation for endocarditis.  Patient received a dose of IV daptomycin.  Preliminary cultures from 11/26/2019 + for Enterococcus faecalis.  Repeat blood cultures pending.  Patient with history of TAVR.  Cardiology consulted by ID for evaluation for TEE to rule out endocarditis which will be done this week. IV daptomycin has been narrowed to IV ampicillin, IV Rocephin per ID recommendations. ID following and I appreciate the input and recommendations.  2.  Coronary artery disease/ischemic cardiomyopathy EF 30%/status post TAVR and AICD/pulmonary hypertension Currently euvolemic.  Continue aspirin, Zetia,Crestor, Entresto.  Patient states had taken himself off his beta-blocker due to low blood pressure.  Toprol-XL has been discontinued.  Hold a.m. dose of Entresto due to hypotension.   3.  COPD Dulera.  Nebs as needed.   4.  Chronic pain/peripheral neuropathy Neurontin.   5.  Morbid obesity  6.  Hypertension Blood pressure borderline.  Blood pressure this morning of 85/36.  Patient asymptomatic.  Patient states was off his beta-blocker prior to admission  due to low blood pressure and told his ID doctor did not want to be on Toprol-XL.  Toprol-XL has been discontinued.  Hold a.m. dose of Entresto.  Change Proscar to nightly.  Follow.  7.  History of gout Allopurinol.   8.  Paroxysmal atrial fibrillation Patient noted to have taken himself off Toprol-XL due to low blood pressure and states blood pressure was low this morning.  Patient also told ID did not want to be on a beta-blocker due to low blood pressure.  Toprol-XL has been discontinued due to hypotension/borderline blood pressure.  On full dose Lovenox for anticoagulation.  9.  Hypokalemia Replete.    DVT prophylaxis: Lovenox Code Status: DNR Family Communication: Updated patient.  No family at bedside. Disposition:   Status is: Inpatient    Dispo: The patient is from: Home              Anticipated d/c is to: Home              Anticipated d/c date is: Next week after TEE is done, repeat blood cultures negative and when cleared by ID.              Patient currently on IV antibiotics for bacteremia, awaiting TEE to be done to rule out endocarditis and ICD infection.  ID following.          Consultants:   ID [place]: 11/28/2019  Procedures:   Chest x-ray 11/27/2019    Antimicrobials:   IV daptomycin 11/27/2019>>>> 11/28/2019  IV Rocephin 11/28/2019  IV ampicillin 11/28/2019     Subjective: Patient sleeping but arousable.  Denies chest pain or shortness of breath.  Wondering when TEE will be done.    Objective: Vitals:   11/30/19 2254 12/01/19 0500 12/01/19 0835 12/01/19 1122  BP:  (!) 85/36 112/64   Pulse: 81 73    Resp: 19 17    Temp:  98.6 F (37 C)    TempSrc:      SpO2: 95% 98%  98%  Weight:        Intake/Output Summary (Last 24 hours) at 12/01/2019 1243 Last data filed at 12/01/2019 2505 Gross per 24 hour  Intake 2600.06 ml  Output 2 ml  Net 2598.06 ml   Filed Weights   11/27/19 1600 11/27/19 2242  Weight: (!) 144.2 kg (!) 144.3 kg     Examination:  General exam: NAD Respiratory system: CTA B.  No wheezes, no crackles normal, no rhonchi.  Normal respiratory effort.  Cardiovascular system: Regular rate rhythm no murmurs rubs or gallops.  No JVD.  No lower extremity edema. Gastrointestinal system: Abdomen is soft, nontender, nondistended, positive bowel sounds.  No rebound.  No guarding.  Central nervous system: Alert and oriented. No focal neurological deficits. Extremities: Status post left transmetatarsal amputation.  Skin: Chronic venous stasis changes noted bilateral lower extremities.  Psychiatry: Judgement and insight appear normal. Mood & affect appropriate.     Data Reviewed: I have personally reviewed following labs and imaging studies  CBC: Recent Labs  Lab 11/26/19 1555 11/26/19 1555 11/27/19 1310 11/28/19 0546 11/29/19 1159 11/30/19 0308 12/01/19 0151  WBC 7.7   < > 9.5 8.1 7.2 7.1 5.3  NEUTROABS 5.9  --  7.6  --   --  4.6  --   HGB 11.1*   < >  11.3* 9.9* 10.6* 10.2* 10.0*  HCT 35.6*   < > 35.6* 31.2* 32.7* 32.8* 32.0*  MCV 93.2   < > 90.8 92.0 90.3 93.2 92.5  PLT 222   < > 212 178 230 165 170   < > = values in this interval not displayed.    Basic Metabolic Panel: Recent Labs  Lab 11/27/19 1310 11/28/19 0546 11/29/19 1159 11/30/19 0308 12/01/19 0151  NA 137 138 140 140 142  K 4.0 3.7 4.5 3.5 3.3*  CL 103 104 105 107 108  CO2 21* 27 25 24 22   GLUCOSE 139* 118* 127* 111* 132*  BUN 11 15 11 9  7*  CREATININE 0.88 0.87 0.68 0.70 0.72  CALCIUM 8.9 8.8* 8.8* 8.6* 8.6*    GFR: Estimated Creatinine Clearance: 108.6 mL/min (by C-G formula based on SCr of 0.72 mg/dL).  Liver Function Tests: Recent Labs  Lab 11/26/19 1555 11/27/19 1310 11/28/19 0546  AST 30 34 42*  ALT 31 31 32  ALKPHOS 67 67 55  BILITOT 0.8 0.9 0.9  PROT 6.0* 6.7 5.8*  ALBUMIN 2.9* 2.9* 2.5*    CBG: No results for input(s): GLUCAP in the last 168 hours.   Recent Results (from the past 240 hour(s))   Culture, blood (single)     Status: Abnormal   Collection Time: 11/26/19  2:40 PM   Specimen: Blood  Result Value Ref Range Status   MICRO NUMBER: [id number]  Final   SPECIMEN QUALITY: Adequate  Final   Source BLOOD 1  Final   STATUS: FINAL  Final   ISOLATE 1: Enterococcus faecalis (A)  Final    Comment: Enterococcus faecalis , from aerobic and anaerobic bottles   COMMENT: Aerobic and anaerobic bottle received.  Final      Susceptibility   Enterococcus faecalis - BLOOD CULTURE, POSITIVE 1    AMPICILLIN <=2 Sensitive     VANCOMYCIN 1 Sensitive     GENTAMICIN SYNERGY*  Sensitive      * This organism does not exhibit high levelresistance to gentamicin which indicates thatsynergy of gentamicin with a penicillin is likely.Legend:S = Susceptible  I = IntermediateR = Resistant  NS = Not susceptible* = Not tested  NR = Not reported**NN = See antimicrobic comments  Culture, blood (single)     Status: Abnormal   Collection Time: 11/26/19  2:41 PM   Specimen: Blood  Result Value Ref Range Status   MICRO NUMBER: [id number]  Final   SPECIMEN QUALITY: Adequate  Final   Source BLOOD 2  Final   STATUS: FINAL  Final   ISOLATE 1: Enterococcus faecalis (A)  Final    Comment: Enterococcus faecalis , from aerobic and anaerobic bottles   COMMENT: Aerobic and anaerobic bottle received.  Final      Susceptibility   Enterococcus faecalis - BLOOD CULTURE, POSITIVE 1    AMPICILLIN <=2 Sensitive     VANCOMYCIN 1 Sensitive     GENTAMICIN SYNERGY*  Sensitive      * This organism does not exhibit high levelresistance to gentamicin which indicates thatsynergy of gentamicin with a penicillin is likely.Legend:S = Susceptible  I = IntermediateR = Resistant  NS = Not susceptible* = Not tested  NR = Not reported**NN = See antimicrobic comments  SARS Coronavirus 2 by RT PCR (hospital order, performed in [facility]) Nasopharyngeal Nasopharyngeal Swab     Status: None   Collection Time: 11/27/19  5:05 PM    Specimen: Nasopharyngeal Swab  Result Value Ref  Range Status   SARS Coronavirus 2 NEGATIVE NEGATIVE Final    Comment: (NOTE) SARS-CoV-2 target nucleic acids are NOT DETECTED. The SARS-CoV-2 RNA is generally detectable in upper and lower respiratory specimens during the acute phase of infection. The lowest concentration of SARS-CoV-2 viral copies this assay can detect is 250 copies / mL. A negative result does not preclude SARS-CoV-2 infection and should not be used as the sole basis for treatment or other patient management decisions.  A negative result may occur with improper specimen collection / handling, submission of specimen other than nasopharyngeal swab, presence of viral mutation(s) within the areas targeted by this assay, and inadequate number of viral copies (<250 copies / mL). A negative result must be combined with clinical observations, patient history, and epidemiological information. Fact Sheet for Patients:   StrictlyIdeas.no Fact Sheet for Healthcare Providers: BankingDealers.co.za This test is not yet approved or cleared  by the [place] FDA and has been authorized for detection and/or diagnosis of SARS-CoV-2 by FDA under an Emergency Use Authorization (EUA).  This EUA will remain in effect (meaning this test can be used) for the duration of the COVID-19 declaration under Section 564(b)(1) of the Act, 21 U.S.C. section 360bbb-3(b)(1), unless the authorization is terminated or revoked sooner. Performed at [facility], [place] [address], [place], [place] [address]   Culture, blood (routine x 2)     Status: None (Preliminary result)   Collection Time: 11/27/19  6:24 PM   Specimen: BLOOD RIGHT HAND  Result Value Ref Range Status   Specimen Description BLOOD RIGHT HAND  Final   Special Requests   Final    BOTTLES DRAWN AEROBIC AND ANAEROBIC Blood Culture results may not be optimal due to an inadequate volume of  blood received in culture bottles   Culture   Final    NO GROWTH 4 DAYS Performed at [facility], [place] [address], [place], [place] [address]    Report Status PENDING  Incomplete  Culture, blood (routine x 2)     Status: None (Preliminary result)   Collection Time: 11/28/19  5:46 AM   Specimen: BLOOD  Result Value Ref Range Status   Specimen Description BLOOD LEFT ANTECUBITAL  Final   Special Requests   Final    BOTTLES DRAWN AEROBIC ONLY Blood Culture adequate volume   Culture   Final    NO GROWTH 3 DAYS Performed at [facility], [place] [address] [place],  [address]    Report Status PENDING  Incomplete  Urine Culture     Status: Abnormal   Collection Time: 11/29/19  3:27 AM   Specimen: Urine, Random  Result Value Ref Range Status   Specimen Description URINE, RANDOM  Final   Special Requests NONE  Final   Culture (A)  Final    <10,000 COLONIES/mL INSIGNIFICANT GROWTH Performed at [facility], [place] [address], [place],  [address]    Report Status 11/30/2019 FINAL  Final         Radiology Studies: No results found.      Scheduled Meds: . allopurinol  300 mg Oral Daily  . aspirin EC  81 mg Oral Daily  . Chlorhexidine Gluconate Cloth  6 each Topical Daily  . enoxaparin (LOVENOX) injection  1 mg/kg Subcutaneous Q12H  . ezetimibe  10 mg Oral Daily  . [START ON 12/02/2019] finasteride  5 mg Oral QHS  . gabapentin  800 mg Oral QID  . mometasone-formoterol  2 puff Inhalation BID  . niacin  1,000 mg Oral BID  . pantoprazole  40 mg Oral Daily  . rosuvastatin  40 mg Oral Daily  . sacubitril-valsartan  0.5 tablet Oral BID  . senna-docusate  1 tablet Oral BID   Continuous Infusions: . ampicillin (OMNIPEN) IV 2 g (12/01/19 0800)  . cefTRIAXone (ROCEPHIN)  IV 2 g (12/01/19 0342)     LOS: 4 days    Time spent: 35 minutes    Irine Seal, MD [facility]   To contact the attending provider between 7A-7P or the covering  provider during after hours 7P-7A, please log into the web site [email or web address] and access using universal [place] password for that web site. If you do not have the password, please call the hospital operator.  12/01/2019, 12:43 PM

## 2019-12-01 NOTE — H&P (View-Only) (Signed)
PROGRESS NOTE    Tim [name]  OZD:[id number] DOB: 31-Oct-1940 DOA: 11/27/2019 PCP: Tammi Sou, MD    No chief complaint on file.   Brief Narrative: HPI per Dr. Jodene Nam is a 79 y.o. 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 -Subsequently underwent dorsal slit procedure by Dr. Tresa Moore on 4/28 for severe phimosis with buried penis, had a Foley catheter thereafter, Foley catheter was removed about a week ago in urology clinic. -He is chronically relatively debilitated and spends most of his time in a recliner, is able to ambulate a little using a walker at home. -He reports weakness dizziness and fevers off and on for the past 2 weeks, temperatures as high as 102, he was seen in the infectious disease clinic yesterday with the symptoms and had blood cultures drawn, he was asked to start oral Augmentin of which she took 2 doses, Dr. Tommy Medal was concerned about the possibility of bacteremia and/or endocarditis. -His blood cultures today grew gram-positive cocci in chains and hence was asked to come back to the emergency room. -He denies any pain swelling or discomfort at his recent amputation site -He was ordered to get repeat blood cultures and given a dose of daptomycin per ID recommendations  Assessment & Plan:   Principal Problem:   Gram-positive bacteremia Active  Problems:   GOUT   Obesity   Essential hypertension, benign   COPD (chronic obstructive pulmonary disease) ([place])   Pulmonary hypertension ([place])   Hereditary peripheral neuropathy   Aortic stenosis   Paroxysmal atrial fibrillation ([facility])   Chronic pain syndrome   Enterococcal bacteremia history    ICD (implantable cardioverter-defibrillator) in place   S/P TAVR (transcatheter aortic valve replacement)  1 Enterococcus faecalis bacteremia/presumed endocarditis Patient treated for enterococcal bacteremia with presumed urinary source as well as deep tissue infection of the left foot which he completed.  Noted over the past 1-2 weeks to be sluggish with low-grade fevers.  Patient seen in ID clinic on 11/26/2019 with repeat blood cultures concerning for gram-positive cocci and subsequently admitted back to the hospital for antibiotic treatment and evaluation for endocarditis.  Patient received a dose of IV daptomycin.  Preliminary cultures from 11/26/2019 + for Enterococcus faecalis.  Repeat blood cultures pending.  Patient with history of TAVR.  Cardiology consulted by ID for evaluation for TEE to rule out endocarditis which will be done this week. IV daptomycin has been narrowed to IV ampicillin, IV Rocephin per ID recommendations. ID following and I appreciate the input and recommendations.  2.  Coronary artery disease/ischemic cardiomyopathy EF 30%/status post TAVR and AICD/pulmonary hypertension Currently euvolemic.  Continue aspirin, Zetia,Crestor, Entresto.  Patient states had taken himself off his beta-blocker due to low blood pressure.  Toprol-XL has been discontinued.  Hold a.m. dose of Entresto due to hypotension.   3.  COPD Dulera.  Nebs as needed.   4.  Chronic pain/peripheral neuropathy Neurontin.   5.  Morbid obesity  6.  Hypertension Blood pressure borderline.  Blood pressure this morning of 85/36.  Patient asymptomatic.  Patient states was off his beta-blocker prior to admission  due to low blood pressure and told his ID doctor did not want to be on Toprol-XL.  Toprol-XL has been discontinued.  Hold a.m. dose of Entresto.  Change Proscar to nightly.  Follow.  7.  History of gout Allopurinol.   8.  Paroxysmal atrial fibrillation Patient noted to have taken himself off Toprol-XL due to low blood pressure and states blood pressure was low this morning.  Patient also told ID did not want to be on a beta-blocker due to low blood pressure.  Toprol-XL has been discontinued due to hypotension/borderline blood pressure.  On full dose Lovenox for anticoagulation.  9.  Hypokalemia Replete.    DVT prophylaxis: Lovenox Code Status: DNR Family Communication: Updated patient.  No family at bedside. Disposition:   Status is: Inpatient    Dispo: The patient is from: Home              Anticipated d/c is to: Home              Anticipated d/c date is: Next week after TEE is done, repeat blood cultures negative and when cleared by ID.              Patient currently on IV antibiotics for bacteremia, awaiting TEE to be done to rule out endocarditis and ICD infection.  ID following.          Consultants:   ID Snider: 11/28/2019  Procedures:   Chest x-ray 11/27/2019    Antimicrobials:   IV daptomycin 11/27/2019>>>> 11/28/2019  IV Rocephin 11/28/2019  IV ampicillin 11/28/2019     Subjective: Patient sleeping but arousable.  Denies chest pain or shortness of breath.  Wondering when TEE will be done.    Objective: Vitals:   11/30/19 2254 12/01/19 0500 12/01/19 0835 12/01/19 1122  BP:  (!) 85/36 112/64   Pulse: 81 73    Resp: 19 17    Temp:  98.6 F (37 C)    TempSrc:      SpO2: 95% 98%  98%  Weight:        Intake/Output Summary (Last 24 hours) at 12/01/2019 1243 Last data filed at 12/01/2019 6948 Gross per 24 hour  Intake 2600.06 ml  Output 2 ml  Net 2598.06 ml   Filed Weights   11/27/19 1600 11/27/19 2242  Weight: (!) 144.2 kg (!) 144.3 kg     Examination:  General exam: NAD Respiratory system: CTA B.  No wheezes, no crackles normal, no rhonchi.  Normal respiratory effort.  Cardiovascular system: Regular rate rhythm no murmurs rubs or gallops.  No JVD.  No lower extremity edema. Gastrointestinal system: Abdomen is soft, nontender, nondistended, positive bowel sounds.  No rebound.  No guarding.  Central nervous system: Alert and oriented. No focal neurological deficits. Extremities: Status post left transmetatarsal amputation.  Skin: Chronic venous stasis changes noted bilateral lower extremities.  Psychiatry: Judgement and insight appear normal. Mood & affect appropriate.     Data Reviewed: I have personally reviewed following labs and imaging studies  CBC: Recent Labs  Lab 11/26/19 1555 11/26/19 1555 11/27/19 1310 11/28/19 0546 11/29/19 1159 11/30/19 0308 12/01/19 0151  WBC 7.7   < > 9.5 8.1 7.2 7.1 5.3  NEUTROABS 5.9  --  7.6  --   --  4.6  --   HGB 11.1*   < >  11.3* 9.9* 10.6* 10.2* 10.0*  HCT 35.6*   < > 35.6* 31.2* 32.7* 32.8* 32.0*  MCV 93.2   < > 90.8 92.0 90.3 93.2 92.5  PLT 222   < > 212 178 230 165 170   < > = values in this interval not displayed.    Basic Metabolic Panel: Recent Labs  Lab 11/27/19 1310 11/28/19 0546 11/29/19 1159 11/30/19 0308 12/01/19 0151  NA 137 138 140 140 142  K 4.0 3.7 4.5 3.5 3.3*  CL 103 104 105 107 108  CO2 21* 27 25 24 22   GLUCOSE 139* 118* 127* 111* 132*  BUN 11 15 11 9  7*  CREATININE 0.88 0.87 0.68 0.70 0.72  CALCIUM 8.9 8.8* 8.8* 8.6* 8.6*    GFR: Estimated Creatinine Clearance: 108.6 mL/min (by C-G formula based on SCr of 0.72 mg/dL).  Liver Function Tests: Recent Labs  Lab 11/26/19 1555 11/27/19 1310 11/28/19 0546  AST 30 34 42*  ALT 31 31 32  ALKPHOS 67 67 55  BILITOT 0.8 0.9 0.9  PROT 6.0* 6.7 5.8*  ALBUMIN 2.9* 2.9* 2.5*    CBG: No results for input(s): GLUCAP in the last 168 hours.   Recent Results (from the past 240 hour(s))   Culture, blood (single)     Status: Abnormal   Collection Time: 11/26/19  2:40 PM   Specimen: Blood  Result Value Ref Range Status   MICRO NUMBER: [id number]  Final   SPECIMEN QUALITY: Adequate  Final   Source BLOOD 1  Final   STATUS: FINAL  Final   ISOLATE 1: Enterococcus faecalis (A)  Final    Comment: Enterococcus faecalis , from aerobic and anaerobic bottles   COMMENT: Aerobic and anaerobic bottle received.  Final      Susceptibility   Enterococcus faecalis - BLOOD CULTURE, POSITIVE 1    AMPICILLIN <=2 Sensitive     VANCOMYCIN 1 Sensitive     GENTAMICIN SYNERGY*  Sensitive      * This organism does not exhibit high levelresistance to gentamicin which indicates thatsynergy of gentamicin with a penicillin is likely.Legend:S = Susceptible  I = IntermediateR = Resistant  NS = Not susceptible* = Not tested  NR = Not reported**NN = See antimicrobic comments  Culture, blood (single)     Status: Abnormal   Collection Time: 11/26/19  2:41 PM   Specimen: Blood  Result Value Ref Range Status   MICRO NUMBER: [id number]  Final   SPECIMEN QUALITY: Adequate  Final   Source BLOOD 2  Final   STATUS: FINAL  Final   ISOLATE 1: Enterococcus faecalis (A)  Final    Comment: Enterococcus faecalis , from aerobic and anaerobic bottles   COMMENT: Aerobic and anaerobic bottle received.  Final      Susceptibility   Enterococcus faecalis - BLOOD CULTURE, POSITIVE 1    AMPICILLIN <=2 Sensitive     VANCOMYCIN 1 Sensitive     GENTAMICIN SYNERGY*  Sensitive      * This organism does not exhibit high levelresistance to gentamicin which indicates thatsynergy of gentamicin with a penicillin is likely.Legend:S = Susceptible  I = IntermediateR = Resistant  NS = Not susceptible* = Not tested  NR = Not reported**NN = See antimicrobic comments  SARS Coronavirus 2 by RT PCR (hospital order, performed in [place] hospital lab) Nasopharyngeal Nasopharyngeal Swab     Status: None   Collection Time: 11/27/19  5:05 PM    Specimen: Nasopharyngeal Swab  Result Value Ref  Range Status   SARS Coronavirus 2 NEGATIVE NEGATIVE Final    Comment: (NOTE) SARS-CoV-2 target nucleic acids are NOT DETECTED. The SARS-CoV-2 RNA is generally detectable in upper and lower respiratory specimens during the acute phase of infection. The lowest concentration of SARS-CoV-2 viral copies this assay can detect is 250 copies / mL. A negative result does not preclude SARS-CoV-2 infection and should not be used as the sole basis for treatment or other patient management decisions.  A negative result may occur with improper specimen collection / handling, submission of specimen other than nasopharyngeal swab, presence of viral mutation(s) within the areas targeted by this assay, and inadequate number of viral copies (<250 copies / mL). A negative result must be combined with clinical observations, patient history, and epidemiological information. Fact Sheet for Patients:   StrictlyIdeas.no Fact Sheet for Healthcare Providers: BankingDealers.co.za This test is not yet approved or cleared  by the [place] FDA and has been authorized for detection and/or diagnosis of SARS-CoV-2 by FDA under an Emergency Use Authorization (EUA).  This EUA will remain in effect (meaning this test can be used) for the duration of the COVID-19 declaration under Section 564(b)(1) of the Act, 21 U.S.C. section 360bbb-3(b)(1), unless the authorization is terminated or revoked sooner. Performed at [facility], [place] [address], [place], [place] [address]   Culture, blood (routine x 2)     Status: None (Preliminary result)   Collection Time: 11/27/19  6:24 PM   Specimen: BLOOD RIGHT HAND  Result Value Ref Range Status   Specimen Description BLOOD RIGHT HAND  Final   Special Requests   Final    BOTTLES DRAWN AEROBIC AND ANAEROBIC Blood Culture results may not be optimal due to an inadequate volume of  blood received in culture bottles   Culture   Final    NO GROWTH 4 DAYS Performed at [facility], [place] [address] [place], [place] [address]    Report Status PENDING  Incomplete  Culture, blood (routine x 2)     Status: None (Preliminary result)   Collection Time: 11/28/19  5:46 AM   Specimen: BLOOD  Result Value Ref Range Status   Specimen Description BLOOD LEFT ANTECUBITAL  Final   Special Requests   Final    BOTTLES DRAWN AEROBIC ONLY Blood Culture adequate volume   Culture   Final    NO GROWTH 3 DAYS Performed at [facility], [place] [address] [place], [place] [address]    Report Status PENDING  Incomplete  Urine Culture     Status: Abnormal   Collection Time: 11/29/19  3:27 AM   Specimen: Urine, Random  Result Value Ref Range Status   Specimen Description URINE, RANDOM  Final   Special Requests NONE  Final   Culture (A)  Final    <10,000 COLONIES/mL INSIGNIFICANT GROWTH Performed at [facility], [place] [address], [place],  [address]    Report Status 11/30/2019 FINAL  Final         Radiology Studies: No results found.      Scheduled Meds: . allopurinol  300 mg Oral Daily  . aspirin EC  81 mg Oral Daily  . Chlorhexidine Gluconate Cloth  6 each Topical Daily  . enoxaparin (LOVENOX) injection  1 mg/kg Subcutaneous Q12H  . ezetimibe  10 mg Oral Daily  . [START ON 12/02/2019] finasteride  5 mg Oral QHS  . gabapentin  800 mg Oral QID  . mometasone-formoterol  2 puff Inhalation BID  . niacin  1,000 mg Oral BID  . pantoprazole  40 mg Oral Daily  . rosuvastatin  40 mg Oral Daily  . sacubitril-valsartan  0.5 tablet Oral BID  . senna-docusate  1 tablet Oral BID   Continuous Infusions: . ampicillin (OMNIPEN) IV 2 g (12/01/19 0800)  . cefTRIAXone (ROCEPHIN)  IV 2 g (12/01/19 0342)     LOS: 4 days    Time spent: 35 minutes    Irine Seal, MD [facility]   To contact the attending provider between 7A-7P or the covering  provider during after hours 7P-7A, please log into the web site [email or web address] and access using universal [place] password for that web site. If you do not have the password, please call the hospital operator.  12/01/2019, 12:43 PM

## 2019-12-01 NOTE — Progress Notes (Signed)
MD notified of + blood culture result from 5/26  Yvonna Alanis

## 2019-12-01 NOTE — Anesthesia Preprocedure Evaluation (Addendum)
Anesthesia Evaluation  Patient identified by MRN, date of birth, ID band Patient awake    Reviewed: Allergy & Precautions, NPO status , Patient's Chart, lab work & pertinent test results, reviewed documented beta blocker date and time   History of Anesthesia Complications Negative for: history of anesthetic complications  Airway Mallampati: II  TM Distance: >3 FB Neck ROM: Full    Dental no notable dental hx.    Pulmonary asthma , sleep apnea and Continuous Positive Airway Pressure Ventilation , COPD,  COPD inhaler,    Pulmonary exam normal        Cardiovascular hypertension, Pt. on medications and Pt. on home beta blockers + CAD, + Past MI, + Cardiac Stents (2017) and +CHF  Normal cardiovascular exam+ dysrhythmias Atrial Fibrillation + Cardiac Defibrillator   S/P TAVR 2017  TEE 09/2019: EF 60-65%, mild MR, TAVR gradient mildly elevated, small PFO with predominantly left to right shunting across the atrial septum   Neuro/Psych negative neurological ROS  negative psych ROS   GI/Hepatic Neg liver ROS, hiatal hernia, PUD, GERD  Medicated and Controlled,  Endo/Other  diabetes, Type 2Morbid obesity  Renal/GU negative Renal ROS  negative genitourinary   Musculoskeletal  (+) Arthritis , narcotic dependent  Abdominal   Peds  Hematology  (+) anemia , Hgb 10.0   Anesthesia Other Findings Bacteremia  Reproductive/Obstetrics negative OB ROS                            Anesthesia Physical Anesthesia Plan  ASA: III  Anesthesia Plan: MAC   Post-op Pain Management:    Induction:   PONV Risk Score and Plan: 1 and Treatment may vary due to age or medical condition and Propofol infusion  Airway Management Planned: Natural Airway and Nasal Cannula  Additional Equipment:   Intra-op Plan:   Post-operative Plan:   Informed Consent: I have reviewed the patients History and Physical, chart, labs and  discussed the procedure including the risks, benefits and alternatives for the proposed anesthesia with the patient or authorized representative who has indicated his/her understanding and acceptance.   Patient has DNR.  Discussed DNR with patient.     Plan Discussed with: CRNA  Anesthesia Plan Comments: (Discussed DNR with patient in preop. Patient would accept intubation if required periprocedurally. Patient does not wish to have defibrillation performed in event of malignant arrhythmia. Daiva Huge, MD)       Anesthesia Quick Evaluation

## 2019-12-01 NOTE — Progress Notes (Addendum)
Patient stated he is able to place his CPAP on himself when he is ready. CPAP at beside when patient is ready. No assistance is needed.

## 2019-12-01 NOTE — Progress Notes (Signed)
ID MD also notified of blood culture result from 5/26.  Tim [name]

## 2019-12-01 NOTE — Progress Notes (Signed)
Pt. Had an uneventful night, slept well with CPap on. He required no PRN medication. BP this am was 85/36 alerted on call.TEE scheduled for early this week.

## 2019-12-02 ENCOUNTER — Encounter (HOSPITAL_COMMUNITY): Payer: Self-pay | Admitting: Internal Medicine

## 2019-12-02 ENCOUNTER — Inpatient Hospital Stay (HOSPITAL_COMMUNITY): Payer: Medicare Other | Admitting: Anesthesiology

## 2019-12-02 ENCOUNTER — Encounter (HOSPITAL_COMMUNITY)
Admission: EM | Disposition: A | Discharge status: Home Health | Payer: Self-pay | Source: Home / Self Care | Attending: Internal Medicine

## 2019-12-02 ENCOUNTER — Inpatient Hospital Stay (HOSPITAL_COMMUNITY): Payer: Medicare Other

## 2019-12-02 DIAGNOSIS — I340 Nonrheumatic mitral (valve) insufficiency: Secondary | ICD-10-CM

## 2019-12-02 DIAGNOSIS — I330 Acute and subacute infective endocarditis: Secondary | ICD-10-CM

## 2019-12-02 DIAGNOSIS — B952 Enterococcus as the cause of diseases classified elsewhere: Secondary | ICD-10-CM

## 2019-12-02 DIAGNOSIS — T827XXA Infection and inflammatory reaction due to other cardiac and vascular devices, implants and grafts, initial encounter: Secondary | ICD-10-CM

## 2019-12-02 DIAGNOSIS — R7881 Bacteremia: Secondary | ICD-10-CM

## 2019-12-02 DIAGNOSIS — I38 Endocarditis, valve unspecified: Secondary | ICD-10-CM

## 2019-12-02 DIAGNOSIS — T826XXA Infection and inflammatory reaction due to cardiac valve prosthesis, initial encounter: Secondary | ICD-10-CM

## 2019-12-02 DIAGNOSIS — Q211 Atrial septal defect: Secondary | ICD-10-CM

## 2019-12-02 HISTORY — DX: Acute and subacute infective endocarditis: I33.0

## 2019-12-02 HISTORY — PX: TEE WITHOUT CARDIOVERSION: SHX5443

## 2019-12-02 LAB — CBC WITH DIFFERENTIAL/PLATELET
Abs Immature Granulocytes: 0.02 10*3/uL (ref 0.00–0.07)
Basophils Absolute: 0 10*3/uL (ref 0.0–0.1)
Basophils Relative: 1 %
Eosinophils Absolute: 0.1 10*3/uL (ref 0.0–0.5)
Eosinophils Relative: 2 %
HCT: 29.4 % — ABNORMAL LOW (ref 39.0–52.0)
Hemoglobin: 9.5 g/dL — ABNORMAL LOW (ref 13.0–17.0)
Immature Granulocytes: 0 %
Lymphocytes Relative: 21 %
Lymphs Abs: 1.2 10*3/uL (ref 0.7–4.0)
MCH: 29.6 pg (ref 26.0–34.0)
MCHC: 32.3 g/dL (ref 30.0–36.0)
MCV: 91.6 fL (ref 80.0–100.0)
Monocytes Absolute: 0.7 10*3/uL (ref 0.1–1.0)
Monocytes Relative: 12 %
Neutro Abs: 3.8 10*3/uL (ref 1.7–7.7)
Neutrophils Relative %: 64 %
Platelets: 162 10*3/uL (ref 150–400)
RBC: 3.21 MIL/uL — ABNORMAL LOW (ref 4.22–5.81)
RDW: 14.9 % (ref 11.5–15.5)
WBC: 5.8 10*3/uL (ref 4.0–10.5)
nRBC: 0 % (ref 0.0–0.2)

## 2019-12-02 LAB — BASIC METABOLIC PANEL
Anion gap: 8 (ref 5–15)
BUN: 6 mg/dL — ABNORMAL LOW (ref 8–23)
CO2: 24 mmol/L (ref 22–32)
Calcium: 8.4 mg/dL — ABNORMAL LOW (ref 8.9–10.3)
Chloride: 109 mmol/L (ref 98–111)
Creatinine, Ser: 0.69 mg/dL (ref 0.61–1.24)
GFR calc Af Amer: >60 mL/min (ref >60)
GFR calc non Af Amer: >60 mL/min (ref >60)
Glucose, Bld: 121 mg/dL — ABNORMAL HIGH (ref 70–99)
Potassium: 3.3 mmol/L — ABNORMAL LOW (ref 3.5–5.1)
Sodium: 141 mmol/L (ref 135–145)

## 2019-12-02 LAB — CULTURE, BLOOD (ROUTINE X 2): Culture: NO GROWTH

## 2019-12-02 LAB — MAGNESIUM: Magnesium: 1.4 mg/dL — ABNORMAL LOW (ref 1.7–2.4)

## 2019-12-02 SURGERY — ECHOCARDIOGRAM, TRANSESOPHAGEAL
Anesthesia: Monitor Anesthesia Care

## 2019-12-02 MED ORDER — PHENYLEPHRINE 40 MCG/ML (10ML) SYRINGE FOR IV PUSH (FOR BLOOD PRESSURE SUPPORT)
PREFILLED_SYRINGE | INTRAVENOUS | Status: DC | PRN
  Administered 2019-12-02 (×2): 80 ug via INTRAVENOUS
  Administered 2019-12-02 (×5): 40 ug via INTRAVENOUS

## 2019-12-02 MED ORDER — LACTATED RINGERS IV SOLN
INTRAVENOUS | Status: DC
  Administered 2019-12-02: 1000 mL via INTRAVENOUS

## 2019-12-02 MED ORDER — MAGNESIUM SULFATE 4 GM/100ML IV SOLN
4.0000 g | Freq: Once | INTRAVENOUS | Status: AC
  Administered 2019-12-02: 4 g via INTRAVENOUS
  Filled 2019-12-02: qty 100

## 2019-12-02 MED ORDER — POTASSIUM CHLORIDE CRYS ER 20 MEQ PO TBCR
40.0000 meq | EXTENDED_RELEASE_TABLET | ORAL | Status: AC
  Administered 2019-12-02 (×2): 40 meq via ORAL
  Filled 2019-12-02 (×2): qty 2

## 2019-12-02 MED ORDER — PROPOFOL 500 MG/50ML IV EMUL
INTRAVENOUS | Status: DC | PRN
  Administered 2019-12-02: 75 ug/kg/min via INTRAVENOUS

## 2019-12-02 MED ORDER — BUTAMBEN-TETRACAINE-BENZOCAINE 2-2-14 % EX AERO
INHALATION_SPRAY | CUTANEOUS | Status: DC | PRN
  Administered 2019-12-02: 2 via TOPICAL

## 2019-12-02 MED ORDER — SODIUM CHLORIDE 0.9 % IV SOLN: INTRAVENOUS | Status: DC

## 2019-12-02 NOTE — Anesthesia Postprocedure Evaluation (Signed)
Anesthesia Post Note  Patient: MCCORMICK MACON  Procedure(s) Performed: TRANSESOPHAGEAL ECHOCARDIOGRAM (TEE) ([name] )     Patient location during evaluation: PACU Anesthesia Type: MAC Level of consciousness: awake and alert and oriented Pain management: pain level controlled Vital Signs Assessment: post-procedure vital signs reviewed and stable Respiratory status: spontaneous breathing, nonlabored ventilation and respiratory function stable Cardiovascular status: blood pressure returned to baseline Postop Assessment: no apparent nausea or vomiting Anesthetic complications: no    Last Vitals:  Vitals:   12/02/19 0906 12/02/19 0931  BP: (!) 125/44 (!) 112/52  Pulse: 73 78  Resp: 18 18  Temp:  36.8 C  SpO2: 97% 96%    Last Pain:  Vitals:   12/02/19 0931  TempSrc: Oral  PainSc:                  Brennan Bailey

## 2019-12-02 NOTE — Transfer of Care (Signed)
Immediate Anesthesia Transfer of Care Note  Patient: Tim [name]  Procedure(s) Performed: TRANSESOPHAGEAL ECHOCARDIOGRAM (TEE) ([name] )  Patient Location: Endoscopy Unit  Anesthesia Type:MAC  Level of Consciousness: awake, alert  and oriented  Airway & Oxygen Therapy: Patient Spontanous Breathing  Post-op Assessment: Report given to RN and Post -op Vital signs reviewed and stable  Post vital signs: Reviewed and stable  Last Vitals:  Vitals Value Taken Time  BP 105-38 12/02/19 0849  Temp 36.7 C 12/02/19 0847  Pulse 72 12/02/19 0851  Resp 20 12/02/19 0851  SpO2 98 % 12/02/19 0851  Vitals shown include unvalidated device data.  Last Pain:  Vitals:   12/02/19 0847  TempSrc: Oral  PainSc: 0-No pain         Complications: No apparent anesthesia complications

## 2019-12-02 NOTE — Progress Notes (Signed)
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 Recommend CVTS evaluation for possible surgical intervention and cardiology for ICD infection. 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 Continue current dose of ampicillin and ceftriaxone.    PLAN:  1. Continue ampicillin and ceftriaxone 2. Recommend CVTS evaluation for possible surgical intervention. 3. Recommend cardiology evaluation for ICD infection.   Principal Problem:   Gram-positive bacteremia Active Problems:   GOUT   Obesity   Essential hypertension, benign   COPD (chronic obstructive pulmonary disease) (HCC)   Pulmonary hypertension (HCC)   Hereditary peripheral neuropathy   Aortic stenosis   Paroxysmal atrial fibrillation (HCC)   Chronic pain syndrome   Enterococcal bacteremia history    ICD (implantable cardioverter-defibrillator) in place   S/P TAVR (transcatheter aortic valve replacement)   . allopurinol  300 mg Oral Daily  . aspirin EC  81 mg Oral Daily  . Chlorhexidine Gluconate Cloth  6 each Topical Daily  . enoxaparin (LOVENOX) injection  1 mg/kg Subcutaneous Q12H  . ezetimibe  10 mg Oral Daily  . finasteride  5 mg Oral QHS  . gabapentin  800 mg Oral QID  . mometasone-formoterol  2 puff Inhalation BID  . niacin  1,000 mg Oral BID  . pantoprazole  40 mg Oral Daily  . rosuvastatin  40 mg Oral Daily  . sacubitril-valsartan  0.5 tablet Oral BID  . senna-docusate  1 tablet Oral BID    SUBJECTIVE:  Afebrile overnight with no acute events. 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  Allergies  Allergen Reactions  . Brimonidine Tartrate Shortness Of Breath    Alphagan-Shortness of breath  . Brinzolamide Shortness Of Breath    AZOPT- Shortness of breath  . Latanoprost Shortness Of Breath    XALATAN- Shortness of breath  . Nucynta [Tapentadol] Shortness Of Breath  . Sulfa Antibiotics Palpitations  . Timolol Maleate Shortness Of Breath and Other (See Comments)    TIMOPTIC- Aggravated asthma  . Diltiazem Swelling     leg swelling  . Rofecoxib Swelling     VIOXX- leg swelling  . Vancomycin Hives and Other (See Comments)    Possible "Red Man Syndrome"? > hives/blisters  . Codeine Other (See Comments)    Childhood reaction  . Tamsulosin Other (See Comments)    Dizziness   . Celecoxib Other (See Comments)    CELLBREX-confusion  . Colchicine Diarrhea    diarrhea  . Tape Rash     Review of Systems: Review of Systems  Constitutional: Negative for chills, fever and weight loss.  Respiratory: Negative for cough, shortness of breath and wheezing.   Cardiovascular: Negative for chest pain and leg swelling.  Gastrointestinal: Negative for abdominal pain, constipation, diarrhea, nausea and vomiting.  Skin: Negative for rash.      OBJECTIVE: Vitals:   12/02/19 0856 12/02/19 0906 12/02/19 0931 12/02/19 1337  BP: (!) 106/37 (!) 125/44 (!) 112/52 (!) 124/52  Pulse: 73 73 78 72  Resp: 18 18 18  18  Temp: 98.3 F (36.8 C)  98.3 F (36.8 C) 98.6 F (37 C)  TempSrc: Axillary  Oral Oral  SpO2: 98% 97% 96% 98%  Weight:       Body mass index is 43.15 kg/m.  Physical Exam Constitutional:      General: He is not in acute distress.    Appearance: He is well-developed. He is obese.     Comments: Lying in bed with head of bed elevated; pleasant.   Cardiovascular:     Rate and Rhythm: Normal rate and regular rhythm.     Heart sounds: Normal heart sounds.  Pulmonary:       Effort: Pulmonary effort is normal.     Breath sounds: Normal breath sounds.  Skin:    General: Skin is warm and dry.  Neurological:     Mental Status: He is alert and oriented to person, place, and time.  Psychiatric:        Mood and Affect: Mood normal.     Lab Results Lab Results  Component Value Date   WBC 5.8 12/02/2019   HGB 9.5 (L) 12/02/2019   HCT 29.4 (L) 12/02/2019   MCV 91.6 12/02/2019   PLT 162 12/02/2019    Lab Results  Component Value Date   CREATININE 0.69 12/02/2019   BUN 6 (L) 12/02/2019   NA 141 12/02/2019   K 3.3 (L) 12/02/2019   CL 109 12/02/2019   CO2 24 12/02/2019    Lab Results  Component Value Date   ALT 32 11/28/2019   AST 42 (H) 11/28/2019   ALKPHOS 55 11/28/2019   BILITOT 0.9 11/28/2019     Microbiology: Recent Results (from the past 240 hour(s))  Culture, blood (single)     Status: Abnormal   Collection Time: 11/26/19  2:40 PM   Specimen: Blood  Result Value Ref Range Status   MICRO NUMBER: [id number]  Final   SPECIMEN QUALITY: Adequate  Final   Source BLOOD 1  Final   STATUS: FINAL  Final   ISOLATE 1: Enterococcus faecalis (A)  Final    Comment: Enterococcus faecalis , from aerobic and anaerobic bottles   COMMENT: Aerobic and anaerobic bottle received.  Final      Susceptibility   Enterococcus faecalis - BLOOD CULTURE, POSITIVE 1    AMPICILLIN <=2 Sensitive     VANCOMYCIN 1 Sensitive     GENTAMICIN SYNERGY*  Sensitive      * This organism does not exhibit high levelresistance to gentamicin which indicates thatsynergy of gentamicin with a penicillin is likely.Legend:S = Susceptible  I = IntermediateR = Resistant  NS = Not susceptible* = Not tested  NR = Not reported**NN = See antimicrobic comments  Culture, blood (single)     Status: Abnormal   Collection Time: 11/26/19  2:41 PM   Specimen: Blood  Result Value Ref Range Status   MICRO NUMBER: [id number]  Final   SPECIMEN QUALITY: Adequate  Final   Source BLOOD 2  Final    STATUS: FINAL  Final   ISOLATE 1: Enterococcus faecalis (A)  Final    Comment: Enterococcus faecalis , from aerobic and anaerobic bottles   COMMENT: Aerobic and anaerobic bottle received.  Final      Susceptibility   Enterococcus faecalis - BLOOD CULTURE, POSITIVE 1    AMPICILLIN <=2 Sensitive     VANCOMYCIN 1 Sensitive     GENTAMICIN SYNERGY*  Sensitive      * This organism does not exhibit high levelresistance to  gentamicin which indicates thatsynergy of gentamicin with a penicillin is likely.Legend:S = Susceptible  I = IntermediateR = Resistant  NS = Not susceptible* = Not tested  NR = Not reported**NN = See antimicrobic comments  SARS Coronavirus 2 by RT PCR (hospital order, performed in [facility] hospital lab) Nasopharyngeal Nasopharyngeal Swab     Status: None   Collection Time: 11/27/19  5:05 PM   Specimen: Nasopharyngeal Swab  Result Value Ref Range Status   SARS Coronavirus 2 NEGATIVE NEGATIVE Final    Comment: (NOTE) SARS-CoV-2 target nucleic acids are NOT DETECTED. The SARS-CoV-2 RNA is generally detectable in upper and lower respiratory specimens during the acute phase of infection. The lowest concentration of SARS-CoV-2 viral copies this assay can detect is 250 copies / mL. A negative result does not preclude SARS-CoV-2 infection and should not be used as the sole basis for treatment or other patient management decisions.  A negative result may occur with improper specimen collection / handling, submission of specimen other than nasopharyngeal swab, presence of viral mutation(s) within the areas targeted by this assay, and inadequate number of viral copies (<250 copies / mL). A negative result must be combined with clinical observations, patient history, and epidemiological information. Fact Sheet for Patients:   StrictlyIdeas.no Fact Sheet for Healthcare Providers: BankingDealers.co.za This test is not yet approved or  cleared  by the [place] FDA and has been authorized for detection and/or diagnosis of SARS-CoV-2 by FDA under an Emergency Use Authorization (EUA).  This EUA will remain in effect (meaning this test can be used) for the duration of the COVID-19 declaration under Section 564(b)(1) of the Act, 21 U.S.C. section 360bbb-3(b)(1), unless the authorization is terminated or revoked sooner. Performed at [facility], [place] [address] [place], [place] [address]   Culture, blood (routine x 2)     Status: None   Collection Time: 11/27/19  6:24 PM   Specimen: BLOOD RIGHT HAND  Result Value Ref Range Status   Specimen Description BLOOD RIGHT HAND  Final   Special Requests   Final    BOTTLES DRAWN AEROBIC AND ANAEROBIC Blood Culture results may not be optimal due to an inadequate volume of blood received in culture bottles   Culture   Final    NO GROWTH 5 DAYS Performed at [facility], [place] [address] [place], [place] [address]    Report Status 12/02/2019 FINAL  Final  Culture, blood (routine x 2)     Status: None (Preliminary result)   Collection Time: 11/28/19  5:46 AM   Specimen: BLOOD  Result Value Ref Range Status   Specimen Description BLOOD LEFT ANTECUBITAL  Final   Special Requests   Final    BOTTLES DRAWN AEROBIC ONLY Blood Culture adequate volume   Culture   Final    NO GROWTH 4 DAYS Performed at [facility], [place] [address], [place], [place] [address]    Report Status PENDING  Incomplete  Urine Culture     Status: Abnormal   Collection Time: 11/29/19  3:27 AM   Specimen: Urine, Random  Result Value Ref Range Status   Specimen Description URINE, RANDOM  Final   Special Requests NONE  Final   Culture (A)  Final    <10,000 COLONIES/mL INSIGNIFICANT GROWTH Performed at [facility], [place] [address] [place],  [address]    Report Status 11/30/2019 FINAL  Final     Terri Piedra, NP [facility] Infect[facility]  [facility]  12/02/2019  2:25 PM

## 2019-12-02 NOTE — Anesthesia Procedure Notes (Signed)
Procedure Name: MAC Date/Time: 12/02/2019 8:00 AM Performed by: Griffin Dakin, CRNA Pre-anesthesia Checklist: Patient identified, Emergency Drugs available, Suction available and Patient being monitored Patient Re-evaluated:Patient Re-evaluated prior to induction Oxygen Delivery Method: Nasal cannula Induction Type: IV induction Airway Equipment and Method: Bite block Placement Confirmation: positive ETCO2 and breath sounds checked- equal and bilateral Dental Injury: Teeth and Oropharynx as per pre-operative assessment

## 2019-12-02 NOTE — CV Procedure (Signed)
    TRANSESOPHAGEAL ECHOCARDIOGRAM   NAME:  Tim [name]    MRN: [id number] DOB:  01-12-1941    ADMIT DATE: 11/27/2019  INDICATIONS: Bacteremia   PROCEDURE:   Informed consent was obtained prior to the procedure. The risks, benefits and alternatives for the procedure were discussed and the patient comprehended these risks.  Risks include, but are not limited to, cough, sore throat, vomiting, nausea, somnolence, esophageal and stomach trauma or perforation, bleeding, low blood pressure, aspiration, pneumonia, infection, trauma to the teeth and death.    Procedural time out performed. The oropharynx was anesthetized with topical 1% benzocaine.    Anesthesia was administered by Dr. Valma Cava.  368 mg propofol given  The patient's heart rate, blood pressure, and oxygen saturation are monitored continuously during the procedure. The period of conscious sedation is 25 minutes, of which I was present face-to-face 100% of this time.   The transesophageal probe was inserted in the esophagus and stomach without difficulty and multiple views were obtained.   COMPLICATIONS:    There were no immediate complications.  KEY FINDINGS:  1. Mobile vegetation attached to [place] in [place] of TAVR valve.  2. Mobile vegetation attached to ppm lead in the RA/RV.  3. Normal LVEF, 60%. 4. No significant regurgitation noted.  5. Full report to follow.  [place] T. Audie Box, [place]  [address], [place], [place] [address] [phone]  8:46 AM

## 2019-12-02 NOTE — Consult Note (Addendum)
Cardiology Consultation:   Patient ID: JOHNGABRIEL VERDE; [id number]; 11-01-1940   Admit date: 11/27/2019 Date of Consult: 12/02/2019  Primary Care Provider: Tammi Sou, MD Primary Cardiologist: Dr. Clovis Riley, [place] Primary Electrophysiologist:  None    Patient Profile:   ANTOWAN SAMFORD is a 79 y.o. 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  History of Present Illness:   Mr. Dittmar presented to the ED 11/27/19 with complaints of weakness, dizziness, and intermittent fevers for the past 2 weeks. He was seen by his ID provider for this complaint and was started on augmentin, however blood cultures drawn at his outpatient visit were positive for gram-positive cocci in chains, therefore he was referred to the ED for further evaluation.   He has had numerous admissions over the past several months. He underwent transmetatarsal amputation for management of osteomyelitis 08/29/19. Then developed bacteremia 2/2 UTI mid 09/2019 with TEE at that time negative for PPM/TAVR infections. He had a cellulitis at his amputation site late 09/2019. Subsequently seen by urology for severe phimosis with buried penis and underwent dorsal slit procedure 10/29/19.    He was last evaluated by cardiology at an outpatient visit with Ezequiel Ganser, PA-C with [facility] 10/07/19, at which time he had complaints of weakness felt to be related to recent hospitalization for foot cellulitis/UTI. Prior to this admission is last echocardiogram was a TEE  09/16/19 which showed EF 60-65%, normal LV diastolic function, no RWMA, no LA thrombus, mild MR, stable TAVR without endocarditis, and a small PFO. His last ischemic evaluation was a LHC in 2018 which showed 25% mLAD stenosis, 50% diagonal stenosis, and 99% ISR of the pRCA with 50% m-dRCA disease. He underwent PCI/DES to pRCA at that time.   From a cardiac standpoint things have been fairly stable. He has chronic DOE which is unchanged in recent weeks/months. Blood pressures have been low resulting in dizziness recently for which he was taken off his blood pressure medications. No complaints of chest pain, LE edema, orthopnea, PND, syncope, or palpitations.    Hospital course: ID consulted and he was started on daptomycin, switched to ampicillin and ceftriaxone once culture data showed ampicillin sensitive e.faecalis. Home eliquis held and he has been maintained on therapeutic lovenox. He underwent a TEE 12/02/19 which showed a mobile vegetation attached to leaflet in RCC of TAVR valve, as well as a mobile vegetation attached to ppm lead in the RA/RV; EF 60% and no significant regurgitation noted. EP consulted given infected device and asked for general cardiology evaluation, as device extraction would not be helpful unless he were to undergo AVR at that time.      Past Medical History:  Diagnosis Date  . AICD (automatic cardioverter/defibrillator) present   . ASTHMA   . Asthma    as a child  . Balanitis    +severe phimosis+ buried penis->circ not possible but dorsal slit done 10/2019  . BPH (benign prostatic hypertrophy)    with urinary retention  . CAD (coronary artery disease)  Nonobstructive by 12/2012 cath; then 03/2016 he required BMS to RCA ([facility]).  In-stent restenosis on cath 11/01/16, baloon angioplasty successful--pt to take plavix (no ASA), + eliquis (for PAF).  [name] CATARACT, HX OF   . Chronic combined systolic and diastolic CHF (congestive heart failure) ([place]) 05/2016   Ischemic CM.   04/2018 EF 40-45%, grd I DD.  [name] Chronic pain syndrome    Lumbar DDD; chronic neuropathic pain (DM); has spinal stimulator and sees pain mgmt MD  . Complicated UTI (urinary tract infection) 09/2019   Phimoses, acute urinary retention, entoerococcus UTI, enterococc bacteremia.  F/u blood clx's neg x 5d.    [name] COPD   . Diabetes mellitus type 2 with complications (HCC)    CBJ6E jumped from 5.7% to 6.1% 03/2015---started metformin at that time.  DM 2 dx by fasting gluc criteria 2018.  Has chronic neuropath pain  . Elevated transaminase level 02/2016   Suspect due to fatty liver (documented on u/s 2007). Plan repeat labs 03/2016.  [name] Enterococcal bacteremia 09/2019   Phimoses, acute urinary retention, entoerococcus UTI, enterococc bacteremia.  F/u blood clx's neg x 5d.    . Essential hypertension, benign   . Fatty liver 2007   2007 u/s showed fatty liver with hepatosplenomegaly.  2019 repeat u/s->fatty liver but no cirrhosis or hepatosplenomegaly.  . FUO (fever of unknown origin) 11/26/2019  . GERD (gastroesophageal reflux disease)   . Glaucoma   . GOUT   . Hallux valgus (acquired)   . HH (hiatus hernia)   . HYPERCHOLESTEROLEMIA-PURE   . Hypogonadism male   . Lumbosacral neuritis   . Lumbosacral spondylosis    Lumbar spinal stenosis with neurogenic claudication--contributes to his chronic pain syndrome  . Normal memory function 08/2014   Neuropsychological testing ([facility]): no cognitive impairment or sign of neurodegenerative disorder.  Likely has adjustment d/o with mixed anxiety/depressed features and may benefit from low dose SNRI.    [name] Normocytic anemia 03/2016   Mild-pt needs ferritin and vit B12 level checked (as of 03/22/16). Hb stable 09/2019  . NSTEMI (non-ST elevated myocardial infarction) ([place]) 03/20/2016   BMS to RCA  . Obesity hypoventilation syndrome ([place])   . Obesity, unspecified   . Orthostatic hypotension   . OSA on CPAP    8 cm H2O  . OSTEOARTHRITIS   .  Paroxysmal atrial fibrillation ([place]) 2003    (? chronic?) Off anticoag for a while due to falls.  Then apixaban started 12/2014.  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 Ella Bodo): neuropathic pain->diff to treat, failed nucynta, failed spinal stimul trial, oxycontin hs + tramadol + gabap as of 12/2017 f/u Dr. Letta Pate.  . Personal history of colonic adenoma 10/30/2012   Diminutive adenoma, consider repeat 2019 per GI  . PFO (patent foramen ovale) 09/2019   small, with predominately L to R shunt  . Pneumonia   . Presence of cardiac defibrillator 11/07/2017  . Primary osteoarthritis of both knees    Bone on bone of medial compartments, + signif patellofemoral arth bilat.--supartz inj series started 09/12/17  . PUD (peptic ulcer disease)   . PULMONARY HYPERTENSION, HX OF   . Secondary male hypogonadism 2017  . 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 Shortness of breath    with exertion: much improved s/p TAVR and treatment for CHF.  . Sick sinus syndrome (HCC)    PPM placed  .  Thrombocytopenia ([place]) 2018   HSM on 2007 abd u/s---suspect some mild splenic sequestration chronically.  [name] Unspecified glaucoma(365.9)   . Unspecified hereditary and idiopathic peripheral neuropathy approx age 66   bilat LE's, ? left arm, too.  Feet became progressively numb + left foot pain intermittently.  Pt may be trying a spinal stimulator (as of 05/2015)  . VENOUS INSUFFICIENCY    Being followed by Dr. Sharol Given as of 10/2016 for two R LL venous stasis ulcers/skin tears.  Healed as of 10/30/16 f/u with Dr. Sharol Given.  . VENTRAL HERNIA   . Wound dehiscence 10/22/2019    Past Surgical History:  Procedure Laterality Date  . AMPUTATION Left 04/11/2013   Procedure: AMPUTATION DIGIT Left 3rd toe;  Surgeon: [name], MD;  Location: [facility];  Service: Orthopedics;  Laterality: Left;  Left 3rd toe amputation at [facility]  .  AMPUTATION Left 08/29/2019   Procedure: LEFT TRANSMETATARSAL AMPUTATION;  Surgeon: [name], MD;  Location: [place];  Service: Orthopedics;  Laterality: Left;  . CARDIAC CATHETERIZATION  1997; 03/10/16   1997 Non-obstructive disease.  03/2016 BMS to RCA, with 25% pDiag dz, o/w normal cors per cath 03/07/16.  Cath 11/01/16: in stent restenosis, successful baloon angioplasty.  [name] CARDIAC CATHETERIZATION  12/24/2012   mild < 20% LCx, prox 30% RCA; LVEF 55-65% , moderate pulmonary HTN, moderate AS  . CARDIAC DEFIBRILLATOR PLACEMENT  11/07/2017   [facility] MRI Quad CRT defibrillator  . CARDIOVASCULAR STRESS TEST  05/11/16 ([facility])   Myocardial perfusion imaging:  No ischemia; scar in apex, global hypokinesis, EF 36%.  . Carotid dopplers  03/09/2016   [facility]: no hemodynamically significant stenosis on either side.  . CHOLECYSTECTOMY    . COLONOSCOPY    . COLONOSCOPY [name] 10/30/2012   Procedure: COLONOSCOPY;  Surgeon: [name], MD;  Location: [facility];  Service: Endoscopy;  Laterality: [name];  . CORONARY ANGIOPLASTY WITH STENT PLACEMENT  03/2016; 04/2017   2017-[facility]: BMS to RCA-pt was placed on Brilinta.  04/2017: DES to RCA.  . DORSAL SLIT [name] 10/29/2019   for severe phimosis. Procedure: DORSAL SLIT;  Surgeon: Alexis Frock, MD;  Location: [facility];  Service: Urology;  Laterality: [name];  21 MINS  . EYE SURGERY Bilateral cataract  . HEMORRHOID SURGERY    . INTRAOCULAR LENS INSERTION Bilateral   . KNEE SURGERY Right   . LEFT AND RIGHT HEART CATHETERIZATION WITH CORONARY ANGIOGRAM [name] 12/24/2012   Procedure: LEFT AND RIGHT HEART CATHETERIZATION WITH CORONARY ANGIOGRAM;  Surgeon: [name], MD;  Location: [facility] CATH LAB;  Service: Cardiovascular;  Laterality: [name];  . LEG SURGERY Bilateral    lenghtening   . PACEMAKER PLACEMENT  04/13/2016   2nd deg HB after TAVR, pt had DC MDT PPM placed.  [name] SHOULDER ARTHROSCOPY  08/30/2011   Procedure: ARTHROSCOPY SHOULDER;  Surgeon: [name], MD;  Location:  [place];  Service: Orthopedics;  Laterality: Right;  Right Shoulder Arthroscopy, Debridement, and Decompression  . SPINAL CORD STIMULATOR INSERTION [name] 09/10/2015   Procedure: LUMBAR SPINAL CORD STIMULATOR INSERTION;  Surgeon: [name], MD;  Location: [facility];  Service: Neurosurgery;  Laterality: [name];  . TEE WITHOUT CARDIOVERSION [name] 09/16/2019   Procedure: TRANSESOPHAGEAL ECHOCARDIOGRAM (TEE);  Surgeon: Skeet Latch, MD;  Location: [place];  Service: Cardiovascular;  Laterality: [name];  . 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  R big toe surg due to osteoarth  . TONSILLECTOMY    . traeculectomy Left  eye  . TRANSCATHETER AORTIC VALVE REPLACEMENT, TRANSFEMORAL  04/11/2016  . TRANSESOPHAGEAL ECHOCARDIOGRAM  03/09/2016; 09/2019   [facility]: EF 55-60%, PFO seen with bi-directional shunting, no thrombus in appendage.  09/2019 ->no valvular vegetations. Small patent foramen ovale with predominantly left to right shunting across the interatrial septum.  . TRANSTHORACIC ECHOCARDIOGRAM  01/2015; 01/2016; 05/18/16; 09/18/16, 05/2017, 08/2017   01/2015 No signif change in aortic stenosis (moderate).  01/2016 Severe LVH w/small LV cavity, EF 60-65%, grade I diast dysfxn.  05/2016 (s/p TAVR): EF 50-55%, grd I DD, biopros AV good.  08/2016--EF 50-55%, LV septal motion c/w conduction abnl, grd I DD,mild MS,bioprosth aortic valve well seated, w/trace AR. 05/2017 TTE EF 35%. 08/2017-EF 35%, mod diff hypokin LV, grd I DD, biopros AV good.   . TRANSTHORACIC ECHOCARDIOGRAM  04/2018; 09/2019   04/2018: EF 40-45%, mod diffuse LV hypokinesis, grd I DD, bioprosth AV well seated, no AS or AR. 09/2019 EF 60-65%, grd I DD, valves fine, including bioprosth AV  . VITRECTOMY       Home Medications:  Prior to Admission medications   Medication Sig Start Date End Date Taking? Authorizing Provider  albuterol (PROVENTIL HFA;VENTOLIN HFA) 108 (90 BASE) MCG/ACT inhaler Inhale 2 puffs into the lungs 4 (four) times daily as  needed for wheezing or shortness of breath.   Yes [provider]  allopurinol (ZYLOPRIM) 300 MG tablet TAKE 1 TABLET BY MOUTH ONCE DAILY WITH FOOD Patient taking differently: Take 300 mg by mouth daily.  06/12/19  Yes McGowen, Adrian Blackwater, MD  amoxicillin-clavulanate (AUGMENTIN) 875-125 MG tablet Take 1 tablet by mouth 2 (two) times daily. Patient taking differently: Take 1 tablet by mouth 2 (two) times daily. Start date 11/26/19 11/26/19  Yes Tommy Medal, [name], MD  apixaban (ELIQUIS) 5 MG TABS tablet Take 5 mg by mouth 2 (two) times daily.   Yes [provider]  ASPIRIN LOW DOSE 81 MG EC tablet Take 81 mg by mouth daily. 02/03/19  Yes [provider]  B Complex-C (B-COMPLEX WITH VITAMIN C) tablet Take 1 tablet by mouth daily. Take 1 tablet daily, 265m   Yes [provider]  ezetimibe (ZETIA) 10 MG tablet take 1 tablet by mouth once daily Patient taking differently: Take 10 mg by mouth daily.  01/18/17  Yes CLelon Perla MD  finasteride (PROSCAR) 5 MG tablet TAKE 1 TABLET BY MOUTH EVERY DAY Patient taking differently: Take 5 mg by mouth daily.  09/08/19  Yes McGowen, PAdrian Blackwater MD  fluticasone (FLONASE) 50 MCG/ACT nasal spray Place 2 sprays into both nostrils as needed for allergies or rhinitis.   Yes [provider]  Fluticasone-Salmeterol (ADVAIR) 250-50 MCG/DOSE AEPB Inhale 1 puff into the lungs 2 (two) times daily.   Yes [provider]  gabapentin (NEURONTIN) 800 MG tablet TAKE 1 TABLET (800 MG TOTAL) BY MOUTH 4 (FOUR) TIMES DAILY. 04/21/19  Yes Kirsteins, ALuanna Salk MD  isosorbide mononitrate (IMDUR) 60 MG 24 hr tablet TAKE 1 TABLET BY MOUTH EVERY DAY Patient taking differently: Take 30 mg by mouth in the morning and at bedtime.  08/28/19  Yes McGowen, PAdrian Blackwater MD  metoprolol succinate (TOPROL-XL) 25 MG 24 hr tablet Take 25 mg by mouth daily. Takes along with 50 mg tablet (total 75 mg)   Yes [provider]  metoprolol succinate  (TOPROL-XL) 50 MG 24 hr tablet Take 50 mg by mouth daily. Take along with the 252mtablet to make total dose of 75102m Yes  [provider]  Multiple Vitamin (MULTIVITAMIN WITH MINERALS) TABS tablet Take 1 tablet by mouth daily.   Yes [provider]  niacin (NIASPAN) 1000 MG CR tablet TAKE 1 TABLET BY MOUTH TWICE A DAY Patient taking differently: Take 1,000 mg by mouth 2 (two) times daily.  04/21/19  Yes McGowen, Adrian Blackwater, MD  nitroGLYCERIN (NITROSTAT) 0.4 MG SL tablet Place 0.4 mg under the tongue every 5 (five) minutes as needed for chest pain.  09/04/17  Yes [provider]  omeprazole (PRILOSEC) 10 MG capsule TAKE 1 CAPSULE BY MOUTH EVERY DAY Patient taking differently: Take 10 mg by mouth daily.  09/25/19  Yes McGowen, Adrian Blackwater, MD  rosuvastatin (CRESTOR) 40 MG tablet take 1 tablet by mouth once daily Patient taking differently: Take 40 mg by mouth daily.  02/05/17  Yes Lelon Perla, MD  sacubitril-valsartan (ENTRESTO) 24-26 MG Take 0.5 tablets by mouth 2 (two) times daily.  07/19/17  Yes [provider]  torsemide (DEMADEX) 20 MG tablet Take 20 mg by mouth 2 (two) times daily as needed (swelling).    Yes [provider]  vitamin C (ASCORBIC ACID) 250 MG tablet Take 250 mg by mouth daily.   Yes [provider]  VITAMIN D, CHOLECALCIFEROL, PO Take 1 tablet by mouth daily.    Yes [provider]  oxyCODONE-acetaminophen (PERCOCET) 5-325 MG tablet Take 1 tablet by mouth every 4 (four) hours as needed for moderate pain or severe pain. Post-operatively Patient not taking: Reported on 11/27/2019 10/29/19 10/28/20  Alexis Frock, MD  senna-docusate (SENOKOT-S) 8.6-50 MG tablet Take 1 tablet by mouth 2 (two) times daily. While taking strong pain meds to prevent constipation. Patient not taking: Reported on 11/27/2019 10/29/19   Alexis Frock, MD    Inpatient Medications: Scheduled Meds: . allopurinol  300 mg Oral Daily  . aspirin EC  81  mg Oral Daily  . Chlorhexidine Gluconate Cloth  6 each Topical Daily  . enoxaparin (LOVENOX) injection  1 mg/kg Subcutaneous Q12H  . ezetimibe  10 mg Oral Daily  . finasteride  5 mg Oral QHS  . gabapentin  800 mg Oral QID  . mometasone-formoterol  2 puff Inhalation BID  . niacin  1,000 mg Oral BID  . pantoprazole  40 mg Oral Daily  . rosuvastatin  40 mg Oral Daily  . sacubitril-valsartan  0.5 tablet Oral BID  . senna-docusate  1 tablet Oral BID   Continuous Infusions: . ampicillin (OMNIPEN) IV 2 g (12/02/19 1534)  . cefTRIAXone (ROCEPHIN)  IV 2 g (12/02/19 1536)   PRN Meds: acetaminophen **OR** acetaminophen, albuterol, fluticasone, nitroGLYCERIN, ondansetron **OR** ondansetron (ZOFRAN) IV, oxyCODONE-acetaminophen  Allergies:    Allergies  Allergen Reactions  . Brimonidine Tartrate Shortness Of Breath    Alphagan-Shortness of breath  . Brinzolamide Shortness Of Breath    AZOPT- Shortness of breath  . Latanoprost Shortness Of Breath    XALATAN- Shortness of breath  . Nucynta [Tapentadol] Shortness Of Breath  . Sulfa Antibiotics Palpitations  . Timolol Maleate Shortness Of Breath and Other (See Comments)    TIMOPTIC- Aggravated asthma  . Diltiazem Swelling     leg swelling  . Rofecoxib Swelling     VIOXX- leg swelling  . Vancomycin Hives and Other (See Comments)    Possible "Red Man Syndrome"? > hives/blisters  . Codeine Other (See Comments)    Childhood reaction  . Tamsulosin Other (See Comments)    Dizziness   . Celecoxib Other (See Comments)  CELLBREX-confusion  . Colchicine Diarrhea    diarrhea  . Tape Rash    Social History:   Social History   Socioeconomic History  . Marital status: Married    Spouse name: Not on file  . Number of children: 0  . Years of education: 72  . Highest education level: Not on file  Occupational History  . Occupation: Engineer, production    Comment: retired  Tobacco Use  . Smoking status: Never Smoker  . Smokeless tobacco: Never  Used  Substance and Sexual Activity  . Alcohol use: No    Alcohol/week: 0.0 standard drinks  . Drug use: No    Types: Oxycodone  . Sexual activity: Not Currently  Other Topics Concern  . Not on file  Social History Narrative   HSG, [name] - BS, [name] - MS-engineering, 2 years on PhD - [place]. Married - '65 - 67yr/divorced; '76- 3 yrs/divorced; '92 . No children. 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 ACP/Living Will - Yes CPR; long-term Mechanical ventilation as long as he was able to cognate; ok for long term artificial nutrition. Precondition being able to cognate and not to have too much pain.    Social Determinants of Health   Financial Resource Strain:   . Difficulty of Paying Living Expenses:   Food Insecurity:   . Worried About RCharity fundraiserin the Last Year:   . RArboriculturistin the Last Year:   Transportation Needs:   . LFilm/video editor(Medical):   .Marland KitchenLack of Transportation (Non-Medical):   Physical Activity:   . Days of Exercise per Week:   . Minutes of Exercise per Session:   Stress:   . Feeling of Stress :   Social Connections:   . Frequency of Communication with Friends and Family:   . Frequency of Social Gatherings with Friends and Family:   . Attends Religious Services:   . Active Member of Clubs or Organizations:   . Attends CArchivistMeetings:   .Marland KitchenMarital Status:   Intimate Partner Violence:   . Fear of Current or Ex-Partner:   . Emotionally Abused:   .Marland KitchenPhysically Abused:   . Sexually Abused:     Family History:    Family History  Problem Relation Age of Onset  . Hypertension Mother   . Coronary artery disease Mother   . Heart attack Mother   . Neuropathy Mother   . Pulmonary fibrosis Father        asbestosis     ROS:  Please see the history of present illness.  ROS  All other ROS reviewed and negative.     Physical Exam/Data:   Vitals:   12/02/19 0856 12/02/19  0906 12/02/19 0931 12/02/19 1337  BP: (!) 106/37 (!) 125/44 (!) 112/52 (!) 124/52  Pulse: 73 73 78 72  Resp: 18 18 18 18   Temp: 98.3 F (36.8 C)  98.3 F (36.8 C) 98.6 F (37 C)  TempSrc: Axillary  Oral Oral  SpO2: 98% 97% 96% 98%  Weight:        Intake/Output Summary (Last 24 hours) at 12/02/2019 1630 Last data filed at 12/02/2019 1554 Gross per 24 hour  Intake 1500 ml  Output --  Net 1500 ml   Filed Weights   11/27/19 1600 11/27/19 2242  Weight: (!) 144.2 kg (!) 144.3 kg   Body mass index is 43.15 kg/m.  General:  Chronically ill appearing morbidly obese gentleman sitting upright in bed in NAD HEENT: sclera anicteric  Lymph: no adenopathy Neck: no JVD Endocrine:  No thryomegaly Vascular: No carotid bruits; distal pulses 2+ bilaterally Cardiac:  normal S1, S2; RRR; no murmurs, rubs, or gallops appreciated Lungs:  clear to auscultation bilaterally, no wheezing, rhonchi or rales  Abd: NABS, soft, nontender, no hepatomegaly Ext: no edema Musculoskeletal:  No deformities, BUE and BLE strength normal and equal Skin: warm and dry  Neuro:  CNs 2-12 intact, no focal abnormalities noted Psych:  Normal affect   EKG:  The EKG was personally reviewed and demonstrates:  None this admission Telemetry:  Telemetry was personally reviewed and demonstrates:  V-paced  Relevant CV Studies: TEE 12/02/19: 1. There is a large mobile vegetation attached to the TAVR valve. It  measures 1.7 cm x 0.5 cm. It appears to be attached to the leaflet of the  RCC. It is freely mobile and consistent with endocarditis in this patient  with positive blood cultures. There  is no regurgitation or paravalvular leak noted. The gradients are stable,  although slightly elevated. The aortic valve has been repaired/replaced.  Aortic valve regurgitation is not visualized. There is a 29 mm Edwards  Sapien prosthetic (TAVR) valve  present in the aortic position. Aortic valve area, by VTI measures 2.06  cm. Aortic  valve mean gradient measures 18.0 mmHg. Aortic valve Vmax  measures 2.68 m/s.  2. There is a mobile vegetation attached the pacer lead in the RV. It  measures 1.4 cm x 1.2 cm. This likely represents endocarditis given the  patient's history of bacteremia. Right ventricular systolic function is  normal. The right ventricular size is  normal.  3. Left ventricular ejection fraction, by estimation, is 55 to 60%. The  left ventricle has normal function. The left ventricle has no regional  wall motion abnormalities.  4. No left atrial/left atrial appendage thrombus was detected. The LAA  emptying velocity was 41 cm/s.  5. The mitral valve is degenerative. Mild mitral valve regurgitation. No  evidence of mitral stenosis.  6. Evidence of atrial level shunting detected by color flow Doppler.  There is a small patent foramen ovale with predominantly left to right  shunting across the atrial septum.  7. There is mild (Grade II) layered plaque involving the ascending aorta  and transverse aorta.   TEE 09/2019: 1. Left ventricular ejection fraction, by estimation, is 60 to 65%. The  left ventricle has normal function. The left ventricle has no regional  wall motion abnormalities.  2. Right ventricular systolic function is normal. The right ventricular  size is normal.  3. No left atrial/left atrial appendage thrombus was detected.  4. The mitral valve is normal in structure. Mild mitral valve  regurgitation. No evidence of mitral stenosis.  5. TAVR gradient mildly elevated. Mean gradient 14 mmHg. This is likely  underestimated due to the angle of detection. TTE data is more accurate.  The aortic valve has been repaired/replaced. Aortic valve regurgitation is  not visualized. Aortic valve mean  gradient measures 14.0 mmHg. Aortic valve Vmax measures 2.49 m/s.  6. The inferior vena cava is normal in size with greater than 50%  respiratory variability, suggesting right atrial pressure  of 3 mmHg.  7. Evidence of atrial level shunting detected by color flow Doppler.  There is a small patent foramen ovale with predominantly left to right  shunting across the atrial septum.   [place] 2018:  FINDINGS: Coronary Angiography 1.  Left Main - Normal 2. Left anterior descending artery - 25% mid 3. Diagonals - 50% proximal 4. Left Circumflex - Normal 5. Obtuse Marginals - Normal 6. Right Coronary Artery - 99% proximal in-stent restenosis, 50% mid, 50% distal 7. Posterior Descending Artery - Normal Additional comments on angiography: Right Dominance  CONCLUSIONS:  1. Successful transradial cardiac catheterization 2. Obstructive one-vessel coronary artery disease involving the RCA with in-stent restenosis of the bare-metal stent, status post drug-eluting stent implantation with lesion reduction from 99% to 0%  RECOMMENDATIONS: The patient will need to remain on plavix and eliquis at discharge.   Laboratory Data:  Chemistry Recent Labs  Lab 11/30/19 0308 12/01/19 0151 2019/12/18 0234  NA 140 142 141  K 3.5 3.3* 3.3*  CL 107 108 109  CO2 24 22 24   GLUCOSE 111* 132* 121*  BUN 9 7* 6*  CREATININE 0.70 0.72 0.69  CALCIUM 8.6* 8.6* 8.4*  GFRNONAA >60 >60 >60  GFRAA >60 >60 >60  ANIONGAP 9 12 8     Recent Labs  Lab 11/26/19 1555 11/27/19 1310 11/28/19 0546  PROT 6.0* 6.7 5.8*  ALBUMIN 2.9* 2.9* 2.5*  AST 30 34 42*  ALT 31 31 32  ALKPHOS 67 67 55  BILITOT 0.8 0.9 0.9   Hematology Recent Labs  Lab 11/30/19 0308 12/01/19 0151 Dec 18, 2019 0234  WBC 7.1 5.3 5.8  RBC 3.52* 3.46* 3.21*  HGB 10.2* 10.0* 9.5*  HCT 32.8* 32.0* 29.4*  MCV 93.2 92.5 91.6  MCH 29.0 28.9 29.6  MCHC 31.1 31.3 32.3  RDW 15.2 15.1 14.9  PLT 165 170 162   Cardiac EnzymesNo results for input(s): TROPONINI in the last 168 hours. No results for input(s): TROPIPOC in the last 168 hours.  BNPNo results for input(s): BNP, PROBNP in the last 168 hours.  DDimer No results for input(s): DDIMER in  the last 168 hours.  Radiology/Studies:  ECHO TEE  Result Date: 2019-12-18    TRANSESOPHOGEAL ECHO REPORT   Patient Name:   CHAUN UEMURA Date of Exam: 2019-12-18 Medical Rec #:  [id number]       Height:       72.0 in Accession #:    [id number]      Weight:       318.1 lb Date of Birth:  [birth year]       BSA:          2.595 m Patient Age:    8 years        BP:           120/43 mmHg Patient Gender: M               HR:           81 bpm. Exam Location:  Inpatient Procedure: Transesophageal Echo, 3D Echo, Color Doppler and Cardiac Doppler Indications:     Bacteremia  History:         Patient has prior history of Echocardiogram examinations, most                  recent 09/16/2019. Pacemaker and Defibrillator, Pulmonary HTN                  and COPD, TAVR; Risk Factors:Hypertension, Diabetes,                  Dyslipidemia and Sleep Apnea.                  Aortic Valve: 29 mm Edwards Sapien prosthetic,  stented (TAVR)                  valve is present in the aortic position.  Sonographer:     Raquel Sarna Senior RDCS Referring Phys:  [id number] [place] Diagnosing Phys: Eleonore Chiquito MD PROCEDURE: After discussion of the risks and benefits of a TEE, an informed consent was obtained from the patient. TEE procedure time was 25 minutes. The transesophogeal probe was passed without difficulty through the esophogus of the patient. Imaged were obtained with the patient in a left lateral decubitus position. Local oropharyngeal anesthetic was provided with Cetacaine. Sedation performed by different physician. The patient was monitored while under deep sedation. Anesthestetic sedation was provided intravenously by Anesthesiology: 375m of Propofol. Image quality was excellent. The patient's vital signs; including heart rate, blood pressure, and oxygen saturation; remained stable throughout the procedure. The patient developed no complications during the procedure. IMPRESSIONS  1. There is a large mobile vegetation attached to the  TAVR valve. It measures 1.7 cm x 0.5 cm. It appears to be attached to the leaflet of the RCC. 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 There is no regurgitation or paravalvular leak noted. The gradients are stable, although slightly elevated. The aortic valve has been repaired/replaced. Aortic valve regurgitation is not visualized. There is a 29 mm Edwards Sapien prosthetic (TAVR) valve present in the aortic position. Aortic valve area, by VTI measures 2.06 cm. Aortic valve mean gradient measures 18.0 mmHg. Aortic valve Vmax measures 2.68 m/s.  2. There is a mobile vegetation attached the pacer lead in the RV. It measures 1.4 cm x 1.2 cm. 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 Right ventricular systolic function is normal. The right ventricular size is normal.  3. Left ventricular ejection fraction, by estimation, is 55 to 60%. The left ventricle has normal function. The left ventricle has no regional wall motion abnormalities.  4. No left atrial/left atrial appendage thrombus was detected. The LAA emptying velocity was 41 cm/s.  5. The mitral valve is degenerative. Mild mitral valve regurgitation. No evidence of mitral stenosis.  6. Evidence of atrial level shunting detected by color flow Doppler. There is a small patent foramen ovale with predominantly left to right shunting across the atrial septum.  7. There is mild (Grade II) layered plaque involving the ascending aorta and transverse aorta. FINDINGS  Left Ventricle: Left ventricular ejection fraction, by estimation, is 55 to 60%. The left ventricle has normal function. The left ventricle has no regional wall motion abnormalities. The left ventricular internal cavity size was normal in size. There is  no left ventricular hypertrophy. Right Ventricle: There is a mobile vegetation attached the pacer lead in the RV. It measures 1.4 cm x 1.2 cm. This likely represents  endocarditis given the patient's history of bacteremia. The right ventricular size is normal. No increase in right ventricular wall thickness. Right ventricular systolic function is normal. Left Atrium: Left atrial size was normal in size. No left atrial/left atrial appendage thrombus was detected. The LAA emptying velocity was 41 cm/s. Right Atrium: Right atrial size was normal in size. Pericardium: Trivial pericardial effusion is present. Mitral Valve: The mitral valve is degenerative in appearance. Mild mitral valve regurgitation. No evidence of mitral valve stenosis. There is no evidence of mitral valve vegetation. Tricuspid Valve: The tricuspid valve is grossly normal. Tricuspid valve regurgitation is mild . No evidence of tricuspid stenosis. Aortic Valve: There  is a large mobile vegetation attached to the TAVR valve. It measures 1.7 cm x 0.5 cm. It appears to be attached to the leaflet of the RCC. 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 There is no regurgitation or paravalvular leak noted. The gradients are stable, although slightly elevated. The aortic valve has been repaired/replaced. Aortic valve regurgitation is not visualized. Aortic valve mean gradient measures 18.0 mmHg. Aortic valve peak gradient measures 28.7 mmHg. Aortic valve area, by VTI measures 2.06 cm. 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 Pulmonic Valve: The pulmonic valve was grossly normal. Pulmonic valve regurgitation is not visualized. No evidence of pulmonic stenosis. There is no evidence of pulmonic valve vegetation. Aorta: The aortic root and ascending aorta are structurally normal, with no evidence of dilitation. There is mild (Grade II) layered plaque involving the ascending aorta and transverse aorta. Venous: The left upper pulmonary vein, left lower pulmonary vein, right upper pulmonary vein and right lower pulmonary  vein are normal. IAS/Shunts: There is redundancy of the interatrial septum. Evidence of atrial level shunting detected by color flow Doppler. A small patent foramen ovale is detected with predominantly left to right shunting across the atrial septum. Additional Comments: A pacer wire is visualized in the right atrium and right ventricle.  LEFT VENTRICLE PLAX 2D LVOT diam:     2.40 cm LV SV:         120 LV SV Index:   46 LVOT Area:     4.52 cm  AORTIC VALVE AV Area (Vmax):    2.25 cm AV Area (Vmean):   2.21 cm AV Area (VTI):     2.06 cm AV Vmax:           268.00 cm/s AV Vmean:          201.000 cm/s AV VTI:            0.584 m AV Peak Grad:      28.7 mmHg AV Mean Grad:      18.0 mmHg LVOT Vmax:         133.50 cm/s LVOT Vmean:        98.350 cm/s LVOT VTI:          0.266 m LVOT/AV VTI ratio: 0.45  AORTA Ao Asc diam: 3.20 cm TRICUSPID VALVE TR Peak grad:   27.0 mmHg TR Vmax:        260.00 cm/s  SHUNTS Systemic VTI:  0.27 m Systemic Diam: 2.40 cm Eleonore Chiquito MD Electronically signed by Eleonore Chiquito MD Signature Date/Time: 12/02/2019/9:31:33 AM    Final     Assessment and Plan:   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 Found to have E.faecalis bacteremia. TEE today confirmed endocarditis of the TAVR valve, as well as vegetation on the PPM leads. He is on ampicillin and ceftriaxone currently. Seen by EP who notes device extraction would not be helpful unless he were to undergo AVR at that time. Given numerous recent infections, he would almost certainly be high risk for recurrent infections at this time.  - Dr. Angelena Form to see - anticipate TCTS consult tomorrow to evaluate for AVR candidacy  - Continue antibiotics per ID  2. CAD s/p PCI/DES to RCA in 2018: no chest pain complaints and stable DOE.Not on BBlocker given recent hypotension - Continue aspirin, crestor, and zetia  3. Paroxysmal atrial fibrillation:  V-paced rhythm on telemetry. No  complaints of palpitations. Eliquis on hold in case of procedure - Continue therapeutic lovenox  4. Aortic stenosis s/p TAVR: now with infected valve on TEE today - Continue management per #1  5. HTN: BP stable this admission. More recently has struggled with hypotension and is no longer on antihypertensive medications.  - Continue to monitor  6. HLD: LDL 29 12/2018; at goal of <70 - Continue crestor and zetia  7. CHB s/p PPM in 2017: underwent CRT-D upgrade in 2019. Device interrogation 10/2019 with 97.7% V-paced. Now with infected leads on TEE today. EP following - Continue IV antibiotics per ID  8. DM type 2: A1C 5.6 10/2019; at goal of <7. Diet controlled.    For questions or updates, please contact [place] Please consult [email or web address] for contact info under Cardiology/STEMI.   Signed, Abigail Butts, PA-C  12/02/2019 4:30 PM [phone]  Structural Heat Team Note:  I have personally seen and examined this patient. I agree with the assessment and plan as outlined above.  79 yo morbidly obese male with history of severe AS s/p TAVR in 2017 at [facility], HTN, CAD, ischemic cardiomyopathy admitted with fevers and chills. His blood cultures are positive for enterococcus. 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 He had a TEE in March 2021 with no evidence of vegetations. Repeat TEE today and he is found to have a large vegetations on the RCC of his TAVR valve with no AI or AS. There is also a vegetation on the RV pacing lead.  He has no chest pain. He has baseline dyspnea.   My exam:  General: Obese male, NAD  HEENT: OP clear, mucus membranes moist  SKIN: warm, dry. No rashes.  Neuro: No focal deficits  Musculoskeletal: Muscle strength 5/5 all ext  Psychiatric: Mood and affect normal  Neck: No JVD, no carotid bruits, no thyromegaly, no lymphadenopathy.  Lungs:Clear bilaterally, no wheezes, rhonci, crackles  Cardiovascular: Regular  rate and rhythm. No murmurs, gallops or rubs.  Abdomen:Soft. Bowel sounds present. Non-tender.  Extremities: No lower extremity edema. Pulses are 2 + in the bilateral DP/PT.   Plan: Bioprosthetic AVR endocarditis: He is currently on IV antibiotics. The large vegetation on the bioprosthetic AVR is not inhibiting valve function. There are no percutaneous options for treatment of the valve. The two options currently are suppressive antibiotic therapy long term vs open surgery with removal of the TAVR valve followed by AVR. This may require an aortic root replacement as well. The patient is not a good candidate for open cardiac surgery given his obesity, age and ongoing infectious sources. I will ask our CT surgery team to see him tomorrow to discuss further. The patient states that he would not be willing to consider any open surgical cardiac procedure. For now, continue antibiotics per guidance of the ID team.   Lauree Chandler 12/02/2019 6:02 PM

## 2019-12-02 NOTE — Consult Note (Addendum)
Cardiology Consultation:   Patient ID: [name] MRN: [id number]; DOB: Oct 02, 1940  Admit date: 11/27/2019 Date of Consult: 12/02/2019  Primary Care Provider: Tammi Sou, MD Primary Cardiologist: Dr. Stanford [name] Primary Electrophysiologist:  Tim [name]   Patient Profile:   Tim [name] is a 79 y.o. male with a hx of PFO, ICM, PAFib, VHD s/p TAVR (04/11/2016 - 29 mm Sapien S3 from the right transfemoral approach)  HTN, CAD 03/2016 - s/p BMS to RCA (3x16 rebel), 10/2016 - s/p PTCA for ISR RCA, 04/2017 - S/P DES to RCA for ISR (3.0x12 synergy),  PPM 2017 (for heart block post TAVR) > CRT-D Sept 2019, chronic CHF (systolic), asthma, arthritis, DM, HLD, obesitywho is being seen today for the evaluation of lead vegetation, bacteremia at the request of Dr. [place] [name]  History of Present Illness:   Tim [name] was hospitalized March 2021 2/2 AMP sensitive enterococcal bacteremia from complicated urinary tract infection, also had fungal balanitis and started on fluconazole, and had also had recently undergone underwent transmetatarsal amputation (08/29/19) by Dr. Sharol [name] for severe infection in his left foot.  TEE done during this admission was negative for evidence of vegetations/endocarditis.  Discharged 09/17/19  10/29/2019 Underwent  PREOPERATIVE DIAGNOSES:   1.  Severe phimosis. 2.  Buried penis.  PROCEDURE:  Dorsal slit. And New foley placement  Admitted to [facility] 11/27/2019 about 2 weeks of weakness, dizziness intermittently and fever reported as high as 102, he was seen out pt in the ID clinic, [facility] were drawn and started on oral Augmentin.  BC came back gram-positive cocci in chains and the pt admitted. ID is involved in his care here as well, their note reports proven recurrence of his enterococcal bacteremia and suspected TAVR and/or device involvment TEE today  Mobile vegetation attached to [place] of TAVR valve.  Mobile vegetation attached to ppm lead in the RA/RV.  Normal LVEF,  60%. No significant regurgitation noted.  Full report to follow.   EP is asked to the case [name] device involvement.  DEVICE information MDT [id number] Claria MRI Quad CRT-D  Leads: 4598 Attain Performa S MRI SureScan 6935 Sprint Quattro Secure S MRI SureScan (RA) 5076 CapSureFix Novus  By notes appears his old RV pacing lead was extracted (CXR is difficult, I do not see an abandoned lead)  10/15/2019 Device check at [employer] like he VP 97.7%  Past Medical History:  Diagnosis Date   AICD (automatic cardioverter/defibrillator) present    ASTHMA    Asthma    as a child   Balanitis    +severe phimosis+ buried penis->circ not possible but dorsal slit done 10/2019   BPH (benign prostatic hypertrophy)    with urinary retention   CAD (coronary artery disease)    Nonobstructive by 12/2012 cath; then 03/2016 he required BMS to RCA ([facility]).  In-stent restenosis on cath 11/01/16, baloon angioplasty successful--pt to take plavix (no ASA), + eliquis (for PAF).   CATARACT, HX OF    Chronic combined systolic and diastolic CHF (congestive heart failure) ([place]) 05/2016   Ischemic CM.  04/2018 EF 40-45%, grd I DD.   Chronic pain syndrome    Lumbar DDD; chronic neuropathic pain (DM); has spinal stimulator and sees pain mgmt MD   Complicated UTI (urinary tract infection) 09/2019   Phimoses, acute urinary retention, entoerococcus UTI, enterococc bacteremia.  F/u blood clx's neg x 5d.     COPD    Diabetes mellitus type 2 with complications ([place])    QVZ5G jumped  from 5.7% to 6.1% 03/2015---started metformin at that time.  DM 2 dx by fasting gluc criteria 2018.  Has chronic neuropath pain   Elevated transaminase level 02/2016   Suspect due to fatty liver (documented on u/s 2007). Plan repeat labs 03/2016.   Enterococcal bacteremia 09/2019   Phimoses, acute urinary retention, entoerococcus UTI, enterococc bacteremia.  F/u blood clx's neg x 5d.     Essential hypertension, benign    Fatty liver 2007    2007 u/s showed fatty liver with hepatosplenomegaly.  2019 repeat u/s->fatty liver but no cirrhosis or hepatosplenomegaly.   FUO (fever of unknown origin) 11/26/2019   GERD (gastroesophageal reflux disease)    Glaucoma    GOUT    Hallux valgus (acquired)    HH (hiatus hernia)    HYPERCHOLESTEROLEMIA-PURE    Hypogonadism male    Lumbosacral neuritis    Lumbosacral spondylosis    Lumbar spinal stenosis with neurogenic claudication--contributes to his chronic pain syndrome   Normal memory function 08/2014   Neuropsychological testing ([facility]): no cognitive impairment or sign of neurodegenerative disorder.  Likely has adjustment d/o with mixed anxiety/depressed features and may benefit from low dose SNRI.     Normocytic anemia 03/2016   Mild-pt needs ferritin and vit B12 level checked (as of 03/22/16). Hb stable 09/2019   NSTEMI (non-ST elevated myocardial infarction) ([place]) 03/20/2016   BMS to RCA   Obesity hypoventilation syndrome (HCC)    Obesity, unspecified    Orthostatic hypotension    OSA on CPAP    8 cm H2O   OSTEOARTHRITIS    Paroxysmal atrial fibrillation ([place]) 2003    (? chronic?) Off anticoag for a while due to falls.  Then apixaban started 12/2014.   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 Ella [name]): neuropathic pain->diff to treat, failed nucynta, failed spinal stimul trial, oxycontin hs + tramadol + gabap as of 12/2017 f/u Dr. Letta [name].   Personal history of colonic adenoma 10/30/2012   Diminutive adenoma, consider repeat 2019 per GI   PFO (patent foramen ovale) 09/2019   small, with predominately L to R shunt   Pneumonia    Presence of cardiac defibrillator 11/07/2017   Primary osteoarthritis of both knees    Bone on bone of medial compartments, + signif patellofemoral arth bilat.--supartz inj series started 09/12/17   PUD (peptic ulcer disease)    PULMONARY HYPERTENSION, HX OF    Secondary male hypogonadism 2017    Severe aortic stenosis    by cath by [facility] 03/2016; CT surgery saw him and did 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   Sick sinus syndrome (HCC)    PPM placed   Thrombocytopenia ([place]) 2018   HSM on 2007 abd u/s---suspect some mild splenic sequestration chronically.   Unspecified glaucoma(365.9)    Unspecified hereditary and idiopathic peripheral neuropathy approx age 62   bilat LE's, ? left arm, too.  Feet became progressively numb + left foot pain intermittently.  Pt may be trying a spinal stimulator (as of 05/2015)   VENOUS INSUFFICIENCY    Being followed by Dr. Sharol [name] as of 10/2016 for two R LL venous stasis ulcers/skin tears.  Healed as of 10/30/16 f/u with Dr. Sharol [name].   VENTRAL HERNIA    Wound dehiscence 10/22/2019    Past Surgical History:  Procedure Laterality Date   AMPUTATION Left 04/11/2013   Procedure: AMPUTATION DIGIT Left 3rd toe;  Surgeon: [name], MD;  Location: [place];  Service: Orthopedics;  Laterality: Left;  Left 3rd toe amputation at [facility]   AMPUTATION Left 08/29/2019   Procedure: LEFT TRANSMETATARSAL AMPUTATION;  Surgeon: [name], MD;  Location: [place];  Service: Orthopedics;  Laterality: Left;   CARDIAC CATHETERIZATION  1997; 03/10/16   1997 Non-obstructive disease.  03/2016 BMS to RCA, with 25% pDiag dz, o/w normal cors per cath 03/07/16.  Cath 11/01/16: in stent restenosis, successful baloon angioplasty.   CARDIAC CATHETERIZATION  12/24/2012   mild < 20% LCx, prox 30% RCA; LVEF 55-65% , moderate pulmonary HTN, moderate AS   CARDIAC DEFIBRILLATOR PLACEMENT  11/07/2017   Claria MRI Quad CRT defibrillator   CARDIOVASCULAR STRESS TEST  05/11/16 ([facility])   Myocardial perfusion imaging:  No ischemia; scar in apex, global hypokinesis, EF 36%.   Carotid dopplers  03/09/2016   [facility]: no hemodynamically significant stenosis on either side.   CHOLECYSTECTOMY     COLONOSCOPY     COLONOSCOPY [name] 10/30/2012    Procedure: COLONOSCOPY;  Surgeon: [name], MD;  Location: [facility];  Service: Endoscopy;  Laterality: [name];   CORONARY ANGIOPLASTY WITH STENT PLACEMENT  03/2016; 04/2017   2017-[facility]: BMS to RCA-pt was placed on Brilinta.  04/2017: DES to RCA.   DORSAL SLIT [name] 10/29/2019   for severe phimosis. Procedure: DORSAL SLIT;  Surgeon: Alexis Frock, MD;  Location: [facility];  Service: Urology;  Laterality: [name];  45 MINS   EYE SURGERY Bilateral cataract   HEMORRHOID SURGERY     INTRAOCULAR LENS INSERTION Bilateral    KNEE SURGERY Right    LEFT AND RIGHT HEART CATHETERIZATION WITH CORONARY ANGIOGRAM [name] 12/24/2012   Procedure: LEFT AND RIGHT HEART CATHETERIZATION WITH CORONARY ANGIOGRAM;  Surgeon: [name], MD;  Location: [facility] CATH [facility];  Service: Cardiovascular;  Laterality: [name];   LEG SURGERY Bilateral    lenghtening    PACEMAKER PLACEMENT  04/13/2016   2nd deg HB after TAVR, pt had DC MDT PPM placed.   SHOULDER ARTHROSCOPY  08/30/2011   Procedure: ARTHROSCOPY SHOULDER;  Surgeon: [name], MD;  Location: [place];  Service: Orthopedics;  Laterality: Right;  Right Shoulder Arthroscopy, Debridement, and Decompression   SPINAL CORD STIMULATOR INSERTION [name] 09/10/2015   Procedure: LUMBAR SPINAL CORD STIMULATOR INSERTION;  Surgeon: [name], MD;  Location: [place] [facility];  Service: Neurosurgery;  Laterality: [name];   TEE WITHOUT CARDIOVERSION [name] 09/16/2019   Procedure: TRANSESOPHAGEAL ECHOCARDIOGRAM (TEE);  Surgeon: Skeet Latch, MD;  Location: [facility];  Service: Cardiovascular;  Laterality: [name];   TOE AMPUTATION Left    due to osteomyelitis.  R big toe surg due to osteoarth   TONSILLECTOMY     traeculectomy Left    eye   TRANSCATHETER AORTIC VALVE REPLACEMENT, TRANSFEMORAL  04/11/2016   TRANSESOPHAGEAL ECHOCARDIOGRAM  03/09/2016; 09/2019   [facility]: EF 55-60%, PFO seen with bi-directional shunting, no thrombus in appendage.  09/2019 ->no valvular vegetations. Small patent foramen  ovale with predominantly left to right shunting across the interatrial septum.   TRANSTHORACIC ECHOCARDIOGRAM  01/2015; 01/2016; 05/18/16; 09/18/16, 05/2017, 08/2017   01/2015 No signif change in aortic stenosis (moderate).  01/2016 Severe LVH w/small LV cavity, EF 60-65%, grade I diast dysfxn.  05/2016 (s/p TAVR): EF 50-55%, grd I DD, biopros AV good.  08/2016--EF 50-55%, LV septal motion c/w conduction abnl, grd I DD,mild MS,bioprosth aortic valve well seated, w/trace AR. 05/2017 TTE EF 35%. 08/2017-EF 35%, mod diff hypokin LV, grd  I DD, biopros AV good.    TRANSTHORACIC ECHOCARDIOGRAM  04/2018; 09/2019   04/2018: EF 40-45%, mod diffuse LV hypokinesis, grd I DD, bioprosth AV well seated, no AS or AR. 09/2019 EF 60-65%, grd I DD, valves fine, including bioprosth AV   VITRECTOMY       Home Medications:  Prior to Admission medications   Medication Sig Start Date End Date Taking? Authorizing Provider  albuterol (PROVENTIL HFA;VENTOLIN HFA) 108 (90 BASE) MCG/ACT inhaler Inhale 2 puffs into the lungs 4 (four) times daily as needed for wheezing or shortness of breath.   Yes [provider]  allopurinol (ZYLOPRIM) 300 MG tablet TAKE 1 TABLET BY MOUTH ONCE DAILY WITH FOOD Patient taking differently: Take 300 mg by mouth daily.  06/12/19  Yes McGowen, Adrian Blackwater, MD  amoxicillin-clavulanate (AUGMENTIN) 875-125 MG tablet Take 1 tablet by mouth 2 (two) times daily. Patient taking differently: Take 1 tablet by mouth 2 (two) times daily. Start date 11/26/19 11/26/19  Yes Tommy Medal, [name], MD  apixaban (ELIQUIS) 5 MG TABS tablet Take 5 mg by mouth 2 (two) times daily.   Yes [provider]  ASPIRIN LOW DOSE 81 MG EC tablet Take 81 mg by mouth daily. 02/03/19  Yes [provider]  B Complex-C (B-COMPLEX WITH VITAMIN C) tablet Take 1 tablet by mouth daily. Take 1 tablet daily, 281m   Yes [provider]  ezetimibe (ZETIA) 10 MG tablet take 1 tablet by mouth once daily Patient taking  differently: Take 10 mg by mouth daily.  01/18/17  Yes CLelon Perla MD  finasteride (PROSCAR) 5 MG tablet TAKE 1 TABLET BY MOUTH EVERY DAY Patient taking differently: Take 5 mg by mouth daily.  09/08/19  Yes McGowen, PAdrian Blackwater MD  fluticasone (FLONASE) 50 MCG/ACT nasal spray Place 2 sprays into both nostrils as needed for allergies or rhinitis.   Yes [provider]  Fluticasone-Salmeterol (ADVAIR) 250-50 MCG/DOSE AEPB Inhale 1 puff into the lungs 2 (two) times daily.   Yes [provider]  gabapentin (NEURONTIN) 800 MG tablet TAKE 1 TABLET (800 MG TOTAL) BY MOUTH 4 (FOUR) TIMES DAILY. 04/21/19  Yes Kirsteins, ALuanna Salk MD  isosorbide mononitrate (IMDUR) 60 MG 24 hr tablet TAKE 1 TABLET BY MOUTH EVERY DAY Patient taking differently: Take 30 mg by mouth in the morning and at bedtime.  08/28/19  Yes McGowen, PAdrian Blackwater MD  metoprolol succinate (TOPROL-XL) 25 MG 24 hr tablet Take 25 mg by mouth daily. Takes along with 50 mg tablet (total 75 mg)   Yes [provider]  metoprolol succinate (TOPROL-XL) 50 MG 24 hr tablet Take 50 mg by mouth daily. Take along with the 222mtablet to make total dose of 7583m Yes [provider]  Multiple Vitamin (MULTIVITAMIN WITH MINERALS) TABS tablet Take 1 tablet by mouth daily.   Yes [provider]  niacin (NIASPAN) 1000 MG CR tablet TAKE 1 TABLET BY MOUTH TWICE A DAY Patient taking differently: Take 1,000 mg by mouth 2 (two) times daily.  04/21/19  Yes McGowen, PhiAdrian BlackwaterD  nitroGLYCERIN (NITROSTAT) 0.4 MG SL tablet Place 0.4 mg under the tongue every 5 (five) minutes as needed for chest pain.  09/04/17  Yes [provider]  omeprazole (PRILOSEC) 10 MG capsule TAKE 1 CAPSULE BY MOUTH EVERY DAY Patient taking differently: Take 10 mg by mouth daily.  09/25/19  Yes McGowen, PhiAdrian BlackwaterD  rosuvastatin (CRESTOR) 40 MG tablet take 1 tablet by  mouth once daily Patient taking differently: Take 40 mg by mouth daily.  02/05/17   Yes Lelon Perla, MD  sacubitril-valsartan (ENTRESTO) 24-26 MG Take 0.5 tablets by mouth 2 (two) times daily.  07/19/17  Yes [provider]  torsemide (DEMADEX) 20 MG tablet Take 20 mg by mouth 2 (two) times daily as needed (swelling).    Yes [provider]  vitamin C (ASCORBIC ACID) 250 MG tablet Take 250 mg by mouth daily.   Yes [provider]  VITAMIN D, CHOLECALCIFEROL, PO Take 1 tablet by mouth daily.    Yes [provider]  oxyCODONE-acetaminophen (PERCOCET) 5-325 MG tablet Take 1 tablet by mouth every 4 (four) hours as needed for moderate pain or severe pain. Post-operatively Patient not taking: Reported on 11/27/2019 10/29/19 10/28/20  Alexis Frock, MD  senna-docusate (SENOKOT-S) 8.6-50 MG tablet Take 1 tablet by mouth 2 (two) times daily. While taking strong pain meds to prevent constipation. Patient not taking: Reported on 11/27/2019 10/29/19   Alexis Frock, MD    Inpatient Medications: Scheduled Meds:  allopurinol  300 mg Oral Daily   aspirin EC  81 mg Oral Daily   Chlorhexidine Gluconate Cloth  6 each Topical Daily   enoxaparin (LOVENOX) injection  1 mg/kg Subcutaneous Q12H   ezetimibe  10 mg Oral Daily   finasteride  5 mg Oral QHS   gabapentin  800 mg Oral QID   mometasone-formoterol  2 puff Inhalation BID   niacin  1,000 mg Oral BID   pantoprazole  40 mg Oral Daily   potassium chloride  40 mEq Oral Q4H   rosuvastatin  40 mg Oral Daily   sacubitril-valsartan  0.5 tablet Oral BID   senna-docusate  1 tablet Oral BID   Continuous Infusions:  ampicillin (OMNIPEN) IV 2 g (12/02/19 1106)   cefTRIAXone (ROCEPHIN)  IV 2 g (12/02/19 0500)   PRN Meds: acetaminophen **OR** acetaminophen, albuterol, fluticasone, nitroGLYCERIN, ondansetron **OR** ondansetron (ZOFRAN) IV, oxyCODONE-acetaminophen  Allergies:    Allergies  Allergen Reactions   Brimonidine Tartrate Shortness Of Breath    Alphagan-Shortness of breath   Brinzolamide  Shortness Of Breath    AZOPT- Shortness of breath   Latanoprost Shortness Of Breath    XALATAN- Shortness of breath   Nucynta [Tapentadol] Shortness Of Breath   Sulfa Antibiotics Palpitations   Timolol Maleate Shortness Of Breath and Other (See Comments)    TIMOPTIC- Aggravated asthma   Diltiazem Swelling     leg swelling   Rofecoxib Swelling     VIOXX- leg swelling   Vancomycin Hives and Other (See Comments)    Possible "Red Man Syndrome"? > hives/blisters   Codeine Other (See Comments)    Childhood reaction   Tamsulosin Other (See Comments)    Dizziness    Celecoxib Other (See Comments)    CELLBREX-confusion   Colchicine Diarrhea    diarrhea   Tape Rash    Social History:   Social History   Socioeconomic History   Marital status: Married    Spouse name: Not on file   Number of children: 0   Years of education: 20   Highest education level: Not on file  Occupational History   Occupation: Engineer, production    Comment: retired  Tobacco Use   Smoking status: Never Smoker   Smokeless tobacco: Never Used  Substance and Sexual Activity   Alcohol use: No    Alcohol/week: 0.0 standard drinks   Drug use: No    Types: Oxycodone  Sexual activity: Not Currently  Other Topics Concern   Not on file  Social History Narrative   HSG, [name] - BS, [employer] - MS-engineering, 2 years on PhD - [place] [employer]. Married - '65 - 23yr/divorced; '76- 3 yrs/divorced; '92 . No children. 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 ACP/Living Will - Yes CPR; long-term Mechanical ventilation as long as he was able to cognate; ok for long term artificial nutrition. Precondition being able to cognate and not to have too much pain.    Social Determinants of Health   Financial Resource Strain:    Difficulty of Paying Living Expenses:   Food Insecurity:    Worried About RCharity fundraiserin the Last Year:    RArboriculturistin the Last Year:   Transportation  Needs:    LFilm/video editor(Medical):    Lack of Transportation (Non-Medical):   Physical Activity:    Days of Exercise per Week:    Minutes of Exercise per Session:   Stress:    Feeling of Stress :   Social Connections:    Frequency of Communication with Friends and Family:    Frequency of Social Gatherings with Friends and Family:    Attends Religious Services:    Active Member of Clubs or Organizations:    Attends CMusic therapist    Marital Status:   Intimate Partner Violence:    Fear of Current or Ex-Partner:    Emotionally Abused:    Physically Abused:    Sexually Abused:     Family History:   Family History  Problem Relation Age of Onset   Hypertension Mother    Coronary artery disease Mother    Heart attack Mother    Neuropathy Mother    Pulmonary fibrosis Father        asbestosis     ROS:  Please see the history of present illness.  All other ROS reviewed and negative.     Physical Exam/Data:   Vitals:   12/02/19 0847 12/02/19 0856 12/02/19 0906 12/02/19 0931  BP: (!) 94/32 (!) 106/37 (!) 125/44 (!) 112/52  Pulse: 68 73 73 78  Resp: 18 18 18 18   Temp: 98.1 F (36.7 C) 98.3 F (36.8 C)  98.3 F (36.8 C)  TempSrc: Oral Axillary  Oral  SpO2: 98% 98% 97% 96%  Weight:        Intake/Output Summary (Last 24 hours) at 12/02/2019 1219 Last data filed at 12/02/2019 0836 Gross per 24 hour  Intake 1180 ml  Output --  Net 1180 ml   Last 3 Weights 11/27/2019 11/27/2019 10/27/2019  Weight (lbs) 318 lb 2 oz 318 lb 318 lb  Weight (kg) 144.3 kg 144.244 kg 144.244 kg     Body mass index is 43.15 kg/m.  General:  Well nourished, well developed, in no acute distress HEENT: normal Lymph: no adenopathy Neck: no JVD Endocrine:  No thryomegaly Vascular: No carotid bruits  Cardiac:  RRR; soft SM, no gallops or rubs Lungs:  CTA b/l, no wheezing, rhonchi or rales  Abd: soft, nontender, obese Ext: trace edema, left transmetatarsal amputated, chronic  skin changes b/l LE Musculoskeletal:  No deformities Skin: warm and dry  Neuro:   No gross focal abnormalities noted Psych:  Normal affect   EKG:  The EKG was personally reviewed and demonstrates:   None this admission  Telemetry:  Telemetry was personally reviewed and demonstrates:  SR/V paced  Relevant CV Studies:  12/02/2019: TEE IMPRESSIONS  1. There is a large mobile vegetation attached to the TAVR valve. It  measures 1.7 cm x 0.5 cm. It appears to be attached to the leaflet of the  RCC. It is freely mobile and consistent with endocarditis in this patient  with positive blood cultures. There  is no regurgitation or paravalvular leak noted. The gradients are stable,  although slightly elevated. The aortic valve has been repaired/replaced.  Aortic valve regurgitation is not visualized. There is a 29 mm Edwards  Sapien prosthetic (TAVR) valve  present in the aortic position. Aortic valve area, by VTI measures 2.06  cm. Aortic valve mean gradient measures 18.0 mmHg. Aortic valve Vmax  measures 2.68 m/s.   2. There is a mobile vegetation attached the pacer lead in the RV. It  measures 1.4 cm x 1.2 cm. This likely represents endocarditis [name] the  patient's history of bacteremia. Right ventricular systolic function is  normal. The right ventricular size is  normal.   3. Left ventricular ejection fraction, by estimation, is 55 to 60%. The  left ventricle has normal function. The left ventricle has no regional  wall motion abnormalities.   4. No left atrial/left atrial appendage thrombus was detected. The LAA  emptying velocity was 41 cm/s.   5. The mitral valve is degenerative. Mild mitral valve regurgitation. No  evidence of mitral stenosis.   6. Evidence of atrial level shunting detected by color flow Doppler.  There is a small patent foramen ovale with predominantly left to right  shunting across the atrial septum.   7. There is mild (Grade II) layered plaque involving the  ascending aorta  and transverse aorta.    Laboratory Data:  High Sensitivity Troponin:  No results for input(s): TROPONINIHS in the last 720 hours.   Chemistry Recent Labs  Lab 11/30/19 0308 12/01/19 0151 12/02/19 0234  NA 140 142 141  K 3.5 3.3* 3.3*  CL 107 108 109  CO2 24 22 24   GLUCOSE 111* 132* 121*  BUN 9 7* 6*  CREATININE 0.70 0.72 0.69  CALCIUM 8.6* 8.6* 8.4*  GFRNONAA >60 >60 >60  GFRAA >60 >60 >60  ANIONGAP 9 12 8     Recent Labs  Lab 11/26/19 1555 11/27/19 1310 11/28/19 0546  PROT 6.0* 6.7 5.8*  ALBUMIN 2.9* 2.9* 2.5*  AST 30 34 42*  ALT 31 31 32  ALKPHOS 67 67 55  BILITOT 0.8 0.9 0.9   Hematology Recent Labs  Lab 11/30/19 0308 12/01/19 0151 12/02/19 0234  WBC 7.1 5.3 5.8  RBC 3.52* 3.46* 3.21*  HGB 10.2* 10.0* 9.5*  HCT 32.8* 32.0* 29.4*  MCV 93.2 92.5 91.6  MCH 29.0 28.9 29.6  MCHC 31.1 31.3 32.3  RDW 15.2 15.1 14.9  PLT 165 170 162   BNPNo results for input(s): BNP, PROBNP in the last 168 hours.  DDimer No results for input(s): DDIMER in the last 168 hours.   Radiology/Studies:     Assessment and Plan:   1. 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   I will have his device checked though suspect his is pacer dependent, see if he has had any appropriate therapies as well.  Would ID to weigh in if life long suppressive antibiotic therapy is an option.  Dr. Lovena Le will see later today   For questions or updates, please contact [facility] Please consult  [email or web address] for contact info under   Signed, Baldwin Jamaica, PA-C  12/02/2019 12:19 PM  EP Attending  Patient seen and examined. Agree with the findings as noted above with minimal modification. The patient has a h/o heart block after TAVR at [facility] and then developed LV dysfunction and underwent upgrade to a biv ICD. He has declined. He has had several amputations, most recently transmetatarsal of the foot. 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 He has improved on IV anti-biotic therapy. His device interrogation is pending. I do not think he is a candidate for AVR. I could remove if ICD system but he would still require a temp/perm PM making clearance more difficulty with SBE on the AVR. I suspect that in his situation, suppressive long term anti-biotics is his only option though not a good option. If the surgery service thought he was a candidate for AVR then ICD system extraction and placement of epicardial pacing leads would also be an option.   Mikle Bosworth.D.

## 2019-12-02 NOTE — Interval H&P Note (Signed)
History and Physical Interval Note:  12/02/2019 7:52 AM  Tim [name]  has presented today for surgery, with the diagnosis of bacteremia.  The various methods of treatment have been discussed with the patient and family. After consideration of risks, benefits and other options for treatment, the patient has consented to  Procedure(s): TRANSESOPHAGEAL ECHOCARDIOGRAM (TEE) ([name]) as a surgical intervention.  The patient's history has been reviewed, patient examined, no change in status, stable for surgery.  I have reviewed the patient's chart and labs.  Questions were answered to the patient's satisfaction.     TEE for bacteremia. TAVR in place. ICD in place. NPO.   [place] T. Audie Box, [place]  [address] [place], [place] [address] [phone]  7:53 AM

## 2019-12-02 NOTE — Progress Notes (Signed)
Echocardiogram Echocardiogram Transesophageal has been performed.  [name] 12/02/2019, 9:11 AM

## 2019-12-02 NOTE — Progress Notes (Signed)
PROGRESS NOTE    Tim [name]  EOF:[id number] DOB: 1941-02-21 DOA: 11/27/2019 PCP: Tammi Sou, MD    No chief complaint on file.   Brief Narrative: HPI per Dr. Jodene Nam is a 79 y.o. 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 -Subsequently underwent dorsal slit procedure by Dr. Tresa Moore on 4/28 for severe phimosis with buried penis, had a Foley catheter thereafter, Foley catheter was removed about a week ago in urology clinic. -He is chronically relatively debilitated and spends most of his time in a recliner, is able to ambulate a little using a walker at home. -He reports weakness dizziness and fevers off and on for the past 2 weeks, temperatures as high as 102, he was seen in the infectious disease clinic yesterday with the symptoms and had blood cultures drawn, he was asked to start oral Augmentin of which she took 2 doses, Dr. Tommy Medal was concerned about the possibility of bacteremia and/or endocarditis. -His blood cultures today grew gram-positive cocci in chains and hence was asked to come back to the emergency room. -He denies any pain swelling or discomfort at his recent amputation site -He was ordered to get repeat blood cultures and given a dose of daptomycin per ID recommendations  Assessment & Plan:   Principal Problem:   Gram-positive bacteremia Active  Problems:   GOUT   Obesity   Essential hypertension, benign   COPD (chronic obstructive pulmonary disease) ([facility])   Pulmonary hypertension ([place])   Hereditary peripheral neuropathy   Aortic stenosis   Paroxysmal atrial fibrillation ([facility])   Chronic pain syndrome   Enterococcal bacteremia history    ICD (implantable cardioverter-defibrillator) in place   S/P TAVR (transcatheter aortic valve replacement)  1 Enterococcus faecalis 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  Noted over the past 1-2 weeks to be sluggish with low-grade fevers.  Patient seen in ID clinic on 11/26/2019 with repeat blood cultures concerning for gram-positive cocci and subsequently admitted back to the hospital for antibiotic treatment and evaluation for endocarditis.  Patient received a dose of IV daptomycin.  Preliminary cultures from 11/26/2019 + for Enterococcus faecalis.  Repeat blood cultures pending.  Patient with history of TAVR.  Cardiology consulted by ID for evaluation for TEE to rule out endocarditis which was done this morning 12/02/2019 which was consistent to TAVR valve infection and PPM lead infection.  Consult with cardiology for further evaluation and recommendations.  Patient initially on IV daptomycin which has been narrowed down to IV ampicillin and IV Rocephin per ID recommendations.  ID following and I appreciate the input and recommendations.   2.  Coronary artery disease/ischemic cardiomyopathy EF 30%/status post TAVR and AICD/pulmonary hypertension Currently euvolemic.  Continue aspirin, Zetia,Crestor, Entresto.  Patient states had taken himself off his beta-blocker due to low blood pressure.  Toprol-XL has been discontinued.  Continue Entresto.  Follow.   3.  COPD Dulera.  Nebs as needed.   4.  Chronic pain/peripheral  neuropathy Neurontin.   5.  Morbid obesity  6.  Hypertension Blood pressure borderline. Patient asymptomatic.  Toprol-XL discontinued.  Proscar changed to nightly.  Continue Entresto.  Follow.    7.  History of gout Allopurinol.   8.  Paroxysmal atrial fibrillation Patient noted to have taken himself off Toprol-XL due to low blood pressure.  Toprol-XL has been discontinued.  Some improvement with blood pressure.  Continue full dose Lovenox for anticoagulation for now until no further procedures are planned and then can subsequently transition back to home dose oral anticoagulation.  9.  Hypokalemia/hypomagnesemia K. Dur 40 mEq p.o. x1.  Magnesium sulfate 4 g IV x1.     DVT prophylaxis: Lovenox Code Status: DNR Family Communication: Updated patient.  No family at bedside. Disposition:   Status is: Inpatient    Dispo: The patient is from: Home              Anticipated d/c is to: Home              Anticipated d/c date is: To be determined.              Patient currently on IV antibiotics for bacteremia, status post TEE with endocarditis of TAVR valve and PPM lead.  Cardiology consultation pending for further evaluation.  ID following.         Consultants:   ID Snider: 11/28/2019  Procedures:   Chest x-ray 11/27/2019  TEE: Per Dr. Audie Box, cardiology  Antimicrobials:   IV daptomycin 11/27/2019>>>> 11/28/2019  IV Rocephin 11/28/2019  IV ampicillin 11/28/2019     Subjective: Patient sitting up in bed has just returned from TEE.  Denies any chest pain.  No shortness of breath.   Objective: Vitals:   12/02/19 0847 12/02/19 0856 12/02/19 0906 12/02/19 0931  BP: (!) 94/32 (!) 106/37 (!) 125/44 (!) 112/52  Pulse: 68 73 73 78  Resp: 18 18 18 18   Temp: 98.1 F (36.7 C) 98.3 F (36.8 C)  98.3 F (36.8 C)  TempSrc: Oral Axillary  Oral  SpO2: 98% 98% 97% 96%  Weight:        Intake/Output Summary (Last 24 hours) at 12/02/2019 1257 Last data filed at 12/02/2019  0836 Gross per 24 hour  Intake 1180 ml  Output --  Net 1180 ml   Filed Weights   11/27/19 1600 11/27/19 2242  Weight: (!) 144.2 kg (!) 144.3 kg    Examination:  General exam: NAD Respiratory system: Lungs clear to auscultation bilaterally.  No wheezes, no crackles, no rhonchi.  Normal respiratory effort.  Cardiovascular system: RRR no murmurs rubs or gallops.  No JVD.  No lower extremity edema. Gastrointestinal system: Abdomen is nontender, nondistended, Positive Bowel Sounds.  No Rebound.  No Guarding. Central nervous system: Alert and oriented. No focal neurological deficits. Extremities: Status post left transmetatarsal amputation.  Skin: Chronic venous stasis changes noted bilateral lower extremities.  Psychiatry: Judgement and insight appear normal. Mood & affect appropriate.     Data Reviewed: I have personally reviewed following labs and imaging studies  CBC: Recent Labs  Lab 11/26/19 1555 11/26/19 1555 11/27/19 1310 11/27/19 1310 11/28/19 0546 11/29/19 1159 11/30/19 0308 12/01/19 0151 12/02/19 0234  WBC 7.7   < > 9.5   < > 8.1 7.2 7.1 5.3 5.8  NEUTROABS 5.9  --  7.6  --   --   --  4.6  --  3.8  HGB 11.1*   < > 11.3*   < > 9.9* 10.6* 10.2* 10.0* 9.5*  HCT 35.6*   < > 35.6*   < > 31.2* 32.7* 32.8* 32.0* 29.4*  MCV 93.2   < > 90.8   < > 92.0 90.3 93.2 92.5 91.6  PLT 222   < > 212   < > 178 230 165 170 162   < > = values in this interval not displayed.    Basic Metabolic Panel: Recent Labs  Lab 11/28/19 0546 11/29/19 1159 11/30/19 0308 12/01/19 0151 12/02/19 0234  NA 138 140 140 142 141  K 3.7 4.5 3.5 3.3* 3.3*  CL 104 105 107 108 109  CO2 27 25 24 22 24   GLUCOSE 118* 127* 111* 132* 121*  BUN 15 11 9  7* 6*  CREATININE 0.87 0.68 0.70 0.72 0.69  CALCIUM 8.8* 8.8* 8.6* 8.6* 8.4*  MG  --   --   --   --  1.4*    GFR: Estimated Creatinine Clearance: 108.6 mL/min (by C-G formula based on SCr of 0.69 mg/dL).  Liver Function Tests: Recent Labs  Lab  11/26/19 1555 11/27/19 1310 11/28/19 0546  AST 30 34 42*  ALT 31 31 32  ALKPHOS 67 67 55  BILITOT 0.8 0.9 0.9  PROT 6.0* 6.7 5.8*  ALBUMIN 2.9* 2.9* 2.5*    CBG: No results for input(s): GLUCAP in the last 168 hours.   Recent Results (from the past 240 hour(s))  Culture, blood (single)     Status: Abnormal   Collection Time: 11/26/19  2:40 PM   Specimen: Blood  Result Value Ref Range Status   MICRO NUMBER: [id number]  Final   SPECIMEN QUALITY: Adequate  Final   Source BLOOD 1  Final   STATUS: FINAL  Final   ISOLATE 1: Enterococcus faecalis (A)  Final    Comment: Enterococcus faecalis , from aerobic and anaerobic bottles   COMMENT: Aerobic and anaerobic bottle received.  Final      Susceptibility   Enterococcus faecalis - BLOOD CULTURE, POSITIVE 1    AMPICILLIN <=2 Sensitive     VANCOMYCIN 1 Sensitive     GENTAMICIN SYNERGY*  Sensitive      * This organism does not exhibit high levelresistance to gentamicin which indicates thatsynergy of gentamicin with a penicillin is likely.Legend:S = Susceptible  I = IntermediateR = Resistant  NS = Not susceptible* = Not tested  NR = Not reported**NN = See antimicrobic comments  Culture, blood (single)     Status: Abnormal   Collection Time: 11/26/19  2:41 PM   Specimen: Blood  Result Value Ref Range Status   MICRO NUMBER: [id number]  Final   SPECIMEN QUALITY: Adequate  Final   Source BLOOD 2  Final   STATUS: FINAL  Final   ISOLATE 1: Enterococcus faecalis (A)  Final    Comment: Enterococcus faecalis , from aerobic and anaerobic bottles   COMMENT: Aerobic and anaerobic bottle received.  Final      Susceptibility   Enterococcus faecalis - BLOOD CULTURE, POSITIVE 1    AMPICILLIN <=2 Sensitive     VANCOMYCIN 1 Sensitive     GENTAMICIN SYNERGY*  Sensitive      * This organism does not exhibit high levelresistance to gentamicin which indicates thatsynergy of gentamicin with a penicillin is likely.Legend:S = Susceptible  I = IntermediateR =  Resistant  NS = Not susceptible* = Not tested  NR =  Not reported**NN = See antimicrobic comments  SARS Coronavirus 2 by RT PCR (hospital order, performed in [facility] hospital lab) Nasopharyngeal Nasopharyngeal Swab     Status: None   Collection Time: 11/27/19  5:05 PM   Specimen: Nasopharyngeal Swab  Result Value Ref Range Status   SARS Coronavirus 2 NEGATIVE NEGATIVE Final    Comment: (NOTE) SARS-CoV-2 target nucleic acids are NOT DETECTED. The SARS-CoV-2 RNA is generally detectable in upper and lower respiratory specimens during the acute phase of infection. The lowest concentration of SARS-CoV-2 viral copies this assay can detect is 250 copies / mL. A negative result does not preclude SARS-CoV-2 infection and should not be used as the sole basis for treatment or other patient management decisions.  A negative result may occur with improper specimen collection / handling, submission of specimen other than nasopharyngeal swab, presence of viral mutation(s) within the areas targeted by this assay, and inadequate number of viral copies (<250 copies / mL). A negative result must be combined with clinical observations, patient history, and epidemiological information. Fact Sheet for Patients:   StrictlyIdeas.no Fact Sheet for Healthcare Providers: BankingDealers.co.za This test is not yet approved or cleared  by the [place] FDA and has been authorized for detection and/or diagnosis of SARS-CoV-2 by FDA under an Emergency Use Authorization (EUA).  This EUA will remain in effect (meaning this test can be used) for the duration of the COVID-19 declaration under Section 564(b)(1) of the Act, 21 U.S.C. section 360bbb-3(b)(1), unless the authorization is terminated or revoked sooner. Performed at [facility], [place] [address], [place], [place] [address]   Culture, blood (routine x 2)     Status: None   Collection Time: 11/27/19   6:24 PM   Specimen: BLOOD RIGHT HAND  Result Value Ref Range Status   Specimen Description BLOOD RIGHT HAND  Final   Special Requests   Final    BOTTLES DRAWN AEROBIC AND ANAEROBIC Blood Culture results may not be optimal due to an inadequate volume of blood received in culture bottles   Culture   Final    NO GROWTH 5 DAYS Performed at [facility], [place] [address], [place], [place] [address]    Report Status 12/02/2019 FINAL  Final  Culture, blood (routine x 2)     Status: None (Preliminary result)   Collection Time: 11/28/19  5:46 AM   Specimen: BLOOD  Result Value Ref Range Status   Specimen Description BLOOD LEFT ANTECUBITAL  Final   Special Requests   Final    BOTTLES DRAWN AEROBIC ONLY Blood Culture adequate volume   Culture   Final    NO GROWTH 4 DAYS Performed at [facility], [place] [address], [place], [place] [address]    Report Status PENDING  Incomplete  Urine Culture     Status: Abnormal   Collection Time: 11/29/19  3:27 AM   Specimen: Urine, Random  Result Value Ref Range Status   Specimen Description URINE, RANDOM  Final   Special Requests NONE  Final   Culture (A)  Final    <10,000 COLONIES/mL INSIGNIFICANT GROWTH Performed at [facility], [place] [address], [place], [place] [address]    Report Status 11/30/2019 FINAL  Final         Radiology Studies: ECHO TEE  Result Date: 12/02/2019    TRANSESOPHOGEAL ECHO REPORT   Patient Name:   JOHATHAN PROVINCE Date of Exam: 12/02/2019 Medical Rec #:  [id number]  Height:       72.0 in Accession #:    [id number]      Weight:       318.1 lb Date of Birth:  Dec 28, 1940       BSA:          2.595 m Patient Age:    69 years        BP:           120/43 mmHg Patient Gender: M               HR:           81 bpm. Exam Location:  Inpatient Procedure: Transesophageal Echo, 3D Echo, Color Doppler and Cardiac Doppler Indications:     Bacteremia  History:         Patient has prior history of Echocardiogram examinations,  most                  recent 09/16/2019. Pacemaker and Defibrillator, Pulmonary HTN                  and COPD, TAVR; Risk Factors:Hypertension, Diabetes,                  Dyslipidemia and Sleep Apnea.                  Aortic Valve: 29 mm Edwards Sapien prosthetic, stented (TAVR)                  valve is present in the aortic position.  Sonographer:     Raquel Sarna Senior RDCS Referring Phys:  [id number] [place] Diagnosing Phys: Eleonore Chiquito MD PROCEDURE: After discussion of the risks and benefits of a TEE, an informed consent was obtained from the patient. TEE procedure time was 25 minutes. The transesophogeal probe was passed without difficulty through the esophogus of the patient. Imaged were obtained with the patient in a left lateral decubitus position. Local oropharyngeal anesthetic was provided with Cetacaine. Sedation performed by different physician. The patient was monitored while under deep sedation. Anesthestetic sedation was provided intravenously by Anesthesiology: 366m of Propofol. Image quality was excellent. The patient's vital signs; including heart rate, blood pressure, and oxygen saturation; remained stable throughout the procedure. The patient developed no complications during the procedure. IMPRESSIONS  1. There is a large mobile vegetation attached to the TAVR valve. It measures 1.7 cm x 0.5 cm. It appears to be attached to the leaflet of the RCC. 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 There is no regurgitation or paravalvular leak noted. The gradients are stable, although slightly elevated. The aortic valve has been repaired/replaced. Aortic valve regurgitation is not visualized. There is a 29 mm Edwards Sapien prosthetic (TAVR) valve present in the aortic position. Aortic valve area, by VTI measures 2.06 cm. Aortic valve mean gradient measures 18.0 mmHg. Aortic valve Vmax measures 2.68 m/s.  2. There is a mobile vegetation attached  the pacer lead in the RV. It measures 1.4 cm x 1.2 cm. 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 Right ventricular systolic function is normal. The right ventricular size is normal.  3. Left ventricular ejection fraction, by estimation, is 55 to 60%. The left ventricle has normal function. The left ventricle has no regional wall motion abnormalities.  4. No left atrial/left atrial appendage thrombus was detected. The LAA emptying velocity was 41 cm/s.  5. The mitral valve is  degenerative. Mild mitral valve regurgitation. No evidence of mitral stenosis.  6. Evidence of atrial level shunting detected by color flow Doppler. There is a small patent foramen ovale with predominantly left to right shunting across the atrial septum.  7. There is mild (Grade II) layered plaque involving the ascending aorta and transverse aorta. FINDINGS  Left Ventricle: Left ventricular ejection fraction, by estimation, is 55 to 60%. The left ventricle has normal function. The left ventricle has no regional wall motion abnormalities. The left ventricular internal cavity size was normal in size. There is  no left ventricular hypertrophy. Right Ventricle: There is a mobile vegetation attached the pacer lead in the RV. It measures 1.4 cm x 1.2 cm. 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 The right ventricular size is normal. No increase in right ventricular wall thickness. Right ventricular systolic function is normal. Left Atrium: Left atrial size was normal in size. No left atrial/left atrial appendage thrombus was detected. The LAA emptying velocity was 41 cm/s. Right Atrium: Right atrial size was normal in size. Pericardium: Trivial pericardial effusion is present. Mitral Valve: The mitral valve is degenerative in appearance. Mild mitral valve regurgitation. No evidence of mitral valve stenosis. There is no evidence of mitral valve vegetation. Tricuspid Valve: The  tricuspid valve is grossly normal. Tricuspid valve regurgitation is mild . No evidence of tricuspid stenosis. Aortic Valve: There is a large mobile vegetation attached to the TAVR valve. It measures 1.7 cm x 0.5 cm. It appears to be attached to the leaflet of the RCC. 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 There is no regurgitation or paravalvular leak noted. The gradients are stable, although slightly elevated. The aortic valve has been repaired/replaced. Aortic valve regurgitation is not visualized. Aortic valve mean gradient measures 18.0 mmHg. Aortic valve peak gradient measures 28.7 mmHg. Aortic valve area, by VTI measures 2.06 cm. 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 Pulmonic Valve: The pulmonic valve was grossly normal. Pulmonic valve regurgitation is not visualized. No evidence of pulmonic stenosis. There is no evidence of pulmonic valve vegetation. Aorta: The aortic root and ascending aorta are structurally normal, with no evidence of dilitation. There is mild (Grade II) layered plaque involving the ascending aorta and transverse aorta. Venous: The left upper pulmonary vein, left lower pulmonary vein, right upper pulmonary vein and right lower pulmonary vein are normal. IAS/Shunts: There is redundancy of the interatrial septum. Evidence of atrial level shunting detected by color flow Doppler. A small patent foramen ovale is detected with predominantly left to right shunting across the atrial septum. Additional Comments: A pacer wire is visualized in the right atrium and right ventricle.  LEFT VENTRICLE PLAX 2D LVOT diam:     2.40 cm LV SV:         120 LV SV Index:   46 LVOT Area:     4.52 cm  AORTIC VALVE AV Area (Vmax):    2.25 cm AV Area (Vmean):   2.21 cm AV Area (VTI):     2.06 cm AV Vmax:           268.00 cm/s AV Vmean:          201.000 cm/s AV VTI:            0.584 m AV Peak  Grad:      28.7 mmHg AV Mean Grad:  18.0 mmHg LVOT Vmax:         133.50 cm/s LVOT Vmean:        98.350 cm/s LVOT VTI:          0.266 m LVOT/AV VTI ratio: 0.45  AORTA Ao Asc diam: 3.20 cm TRICUSPID VALVE TR Peak grad:   27.0 mmHg TR Vmax:        260.00 cm/s  SHUNTS Systemic VTI:  0.27 m Systemic Diam: 2.40 cm Eleonore Chiquito MD Electronically signed by Eleonore Chiquito MD Signature Date/Time: 12/02/2019/9:31:33 AM    Final         Scheduled Meds: . allopurinol  300 mg Oral Daily  . aspirin EC  81 mg Oral Daily  . Chlorhexidine Gluconate Cloth  6 each Topical Daily  . enoxaparin (LOVENOX) injection  1 mg/kg Subcutaneous Q12H  . ezetimibe  10 mg Oral Daily  . finasteride  5 mg Oral QHS  . gabapentin  800 mg Oral QID  . mometasone-formoterol  2 puff Inhalation BID  . niacin  1,000 mg Oral BID  . pantoprazole  40 mg Oral Daily  . potassium chloride  40 mEq Oral Q4H  . rosuvastatin  40 mg Oral Daily  . sacubitril-valsartan  0.5 tablet Oral BID  . senna-docusate  1 tablet Oral BID   Continuous Infusions: . ampicillin (OMNIPEN) IV 2 g (12/02/19 1106)  . cefTRIAXone (ROCEPHIN)  IV 2 g (12/02/19 0500)     LOS: 5 days    Time spent: 35 minutes    Irine Seal, MD [facility]   To contact the attending provider between 7A-7P or the covering provider during after hours 7P-7A, please log into the web site [email or web address] and access using universal [place] password for that web site. If you do not have the password, please call the hospital operator.  12/02/2019, 12:57 PM

## 2019-12-03 ENCOUNTER — Inpatient Hospital Stay: Payer: Self-pay

## 2019-12-03 DIAGNOSIS — I350 Nonrheumatic aortic (valve) stenosis: Secondary | ICD-10-CM

## 2019-12-03 DIAGNOSIS — I339 Acute and subacute endocarditis, unspecified: Secondary | ICD-10-CM

## 2019-12-03 LAB — CBC WITH DIFFERENTIAL/PLATELET
Abs Immature Granulocytes: 0.03 10*3/uL (ref 0.00–0.07)
Basophils Absolute: 0 10*3/uL (ref 0.0–0.1)
Basophils Relative: 1 %
Eosinophils Absolute: 0.1 10*3/uL (ref 0.0–0.5)
Eosinophils Relative: 2 %
HCT: 31.6 % — ABNORMAL LOW (ref 39.0–52.0)
Hemoglobin: 10.1 g/dL — ABNORMAL LOW (ref 13.0–17.0)
Immature Granulocytes: 1 %
Lymphocytes Relative: 20 %
Lymphs Abs: 1.2 10*3/uL (ref 0.7–4.0)
MCH: 29.4 pg (ref 26.0–34.0)
MCHC: 32 g/dL (ref 30.0–36.0)
MCV: 92.1 fL (ref 80.0–100.0)
Monocytes Absolute: 0.6 10*3/uL (ref 0.1–1.0)
Monocytes Relative: 10 %
Neutro Abs: 3.9 10*3/uL (ref 1.7–7.7)
Neutrophils Relative %: 66 %
Platelets: 168 10*3/uL (ref 150–400)
RBC: 3.43 MIL/uL — ABNORMAL LOW (ref 4.22–5.81)
RDW: 15.1 % (ref 11.5–15.5)
WBC: 5.9 10*3/uL (ref 4.0–10.5)
nRBC: 0 % (ref 0.0–0.2)

## 2019-12-03 LAB — BASIC METABOLIC PANEL
Anion gap: 7 (ref 5–15)
BUN: 8 mg/dL (ref 8–23)
CO2: 24 mmol/L (ref 22–32)
Calcium: 8.3 mg/dL — ABNORMAL LOW (ref 8.9–10.3)
Chloride: 110 mmol/L (ref 98–111)
Creatinine, Ser: 0.65 mg/dL (ref 0.61–1.24)
GFR calc Af Amer: >60 mL/min (ref >60)
GFR calc non Af Amer: >60 mL/min (ref >60)
Glucose, Bld: 144 mg/dL — ABNORMAL HIGH (ref 70–99)
Potassium: 4.5 mmol/L (ref 3.5–5.1)
Sodium: 141 mmol/L (ref 135–145)

## 2019-12-03 LAB — CULTURE, BLOOD (ROUTINE X 2)
Culture: NO GROWTH
Special Requests: ADEQUATE

## 2019-12-03 LAB — MAGNESIUM: Magnesium: 1.8 mg/dL (ref 1.7–2.4)

## 2019-12-03 MED ORDER — SODIUM CHLORIDE 0.9% FLUSH
10.0000 mL | INTRAVENOUS | Status: DC | PRN
  Administered 2019-12-05: 10 mL

## 2019-12-03 NOTE — Progress Notes (Addendum)
Pt's device checked yesterday by MD[employer] rep.  He is device dependent. D/W Dr. Lovena Le (and as discussed in his note) Unless he is felt candidate for AVR would not pursue device extraction, he would require temp-perm.  If he has AVR would extract his device at that time with new device with epicardial wires by surgeon.  Otherwise suppressive antibiotics.  EP service will sign off, though remain available.  Please recall if needed.  Tommye Standard, PA-C  EP Attending  Discussed with Tommye Standard, PA-C. Agree with findings as noted above.  At this time, no indication for ICD system extraction unless he is thought to be a candidate for AVR/ new Ascending aorta. Continue suppressive anti-biotics.  Mikle Bosworth.D.

## 2019-12-03 NOTE — Consult Note (Addendum)
Tim [name]       [place] [address]             [phone]        Seve J [facility] [place] Medical Record #[id number] Date of Birth: Feb 14, 1941  Referring: No ref. provider found Primary Care: Tammi Sou, MD Primary Cardiologist:No primary care provider on file.  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  The patient is chronically ill with multiple cardiac comorbidities including CAD/ischemic cardiomyopathy, ejection fraction of 35% with a biventricular ICD, and aortic stenosis status post TAVR in 2017.  Additionally has other significant comorbidities including morbid obesity, COPD, previous transmetatarsal amputation for osteomyelitis done by Dr. Sharol Given in February.  He was hospitalized in March of this year with a Enterococcus bacteremia secondary to a complicated UTI.  TEE was done at that time to evaluate his TAVR and pacemaker and both were negative for endocarditis.  He was treated with intravenous ampicillin until 09/26/2019 and then completed a course of doxycycline and Zyvox for suspected cellulitis near his amputation site.  He additionally has undergone a dorsal slit procedure by Dr. Barbee Cough in April for severe phimosis and has had a Foley catheter until recently when it was removed by the urology clinic.  Most recently he presents back to the hospital with weakness, dizziness and fevers for approximately 2 weeks with a temperature as high as 102 degrees.  He was seen by infectious disease clinic on 11/30/2019 and was started on oral Augmentin.  Due to the concern about possible bacteremia and/or endocarditis he was admitted for further evaluation and treatment.  He has subsequently undergone transesophageal echocardiogram which  confirms findings of prosthetic aortic valve endocarditis.  We are asked to see the patient in  consultation for recommendations from a cardiothoracic surgical perspective.    Current Activity/ Functional Status: limited mobility d/t chronic deconditioning, multiple co-morbidities and obesity   Zubrod Score: At the time of surgery this patient's most appropriate activity status/level should be described as: []     0    Normal activity, no symptoms []     1    Restricted in physical strenuous activity but ambulatory, able to do out light work [x]     2    Ambulatory and capable of self care, unable to do work activities, up and about                 more than 50%  Of the time                            []     3    Only limited self care, in bed greater than 50% of waking hours []     4    Completely disabled, no self care, confined to bed or chair []     5    Moribund  Past Medical History:  Diagnosis Date  . AICD (automatic cardioverter/defibrillator) present   . ASTHMA   . Asthma    as a child  . Balanitis    +severe phimosis+ buried penis->circ not possible but dorsal slit done 10/2019  . BPH (benign prostatic hypertrophy)    with urinary retention  . CAD (coronary artery  disease)    Nonobstructive by 12/2012 cath; then 03/2016 he required BMS to RCA ([facility]).  In-stent restenosis on cath 11/01/16, baloon angioplasty successful--pt to take plavix (no ASA), + eliquis (for PAF).  [name] CATARACT, HX OF   . Chronic combined systolic and diastolic CHF (congestive heart failure) ([place]) 05/2016   Ischemic CM.  04/2018 EF 40-45%, grd I DD.  [name] Chronic pain syndrome    Lumbar DDD; chronic neuropathic pain (DM); has spinal stimulator and sees pain mgmt MD  . Complicated UTI (urinary tract infection) 09/2019   Phimoses, acute urinary retention, entoerococcus UTI, enterococc bacteremia.  F/u blood clx's neg x 5d.    [name] COPD   . Diabetes mellitus type 2 with complications (HCC)    ZOX0R jumped from 5.7% to  6.1% 03/2015---started metformin at that time.  DM 2 dx by fasting gluc criteria 2018.  Has chronic neuropath pain  . Elevated transaminase level 02/2016   Suspect due to fatty liver (documented on u/s 2007). Plan repeat labs 03/2016.  [name] Enterococcal bacteremia 09/2019   Phimoses, acute urinary retention, entoerococcus UTI, enterococc bacteremia.  F/u blood clx's neg x 5d.    . Essential hypertension, benign   . Fatty liver 2007   2007 u/s showed fatty liver with hepatosplenomegaly.  2019 repeat u/s->fatty liver but no cirrhosis or hepatosplenomegaly.  . FUO (fever of unknown origin) 11/26/2019  . GERD (gastroesophageal reflux disease)   . Glaucoma   . GOUT   . Hallux valgus (acquired)   . HH (hiatus hernia)   . HYPERCHOLESTEROLEMIA-PURE   . Hypogonadism male   . Lumbosacral neuritis   . Lumbosacral spondylosis    Lumbar spinal stenosis with neurogenic claudication--contributes to his chronic pain syndrome  . Normal memory function 08/2014   Neuropsychological testing ([facility]): no cognitive impairment or sign of neurodegenerative disorder.  Likely has adjustment d/o with mixed anxiety/depressed features and may benefit from low dose SNRI.    [name] Normocytic anemia 03/2016   Mild-pt needs ferritin and vit B12 level checked (as of 03/22/16). Hb stable 09/2019  . NSTEMI (non-ST elevated myocardial infarction) ([facility]) 03/20/2016   BMS to RCA  . Obesity hypoventilation syndrome ([facility])   . Obesity, unspecified   . Orthostatic hypotension   . OSA on CPAP    8 cm H2O  . OSTEOARTHRITIS   . Paroxysmal atrial fibrillation ([facility]) 2003    (? chronic?) Off anticoag for a while due to falls.  Then apixaban started 12/2014.  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 Ella Bodo): neuropathic pain->diff to treat, failed nucynta, failed spinal stimul trial, oxycontin hs + tramadol + gabap as of 12/2017 f/u Dr. Letta Pate.  . Personal history of colonic adenoma  10/30/2012   Diminutive adenoma, consider repeat 2019 per GI  . PFO (patent foramen ovale) 09/2019   small, with predominately L to R shunt  . Pneumonia   . Presence of cardiac defibrillator 11/07/2017  . Primary osteoarthritis of both knees    Bone on bone of medial compartments, + signif patellofemoral arth bilat.--supartz inj series started 09/12/17  . PUD (peptic ulcer disease)   . PULMONARY HYPERTENSION, HX OF   . Secondary male hypogonadism 2017  . 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 Shortness of breath    with exertion: much improved s/p TAVR and treatment for CHF.  . Sick sinus syndrome ([facility])  PPM placed  . Thrombocytopenia ([facility]) 2018   HSM on 2007 abd u/s---suspect some mild splenic sequestration chronically.  [name] Unspecified glaucoma(365.9)   . Unspecified hereditary and idiopathic peripheral neuropathy approx age 89   bilat LE's, ? left arm, too.  Feet became progressively numb + left foot pain intermittently.  Pt may be trying a spinal stimulator (as of 05/2015)  . VENOUS INSUFFICIENCY    Being followed by Dr. Sharol Given as of 10/2016 for two R LL venous stasis ulcers/skin tears.  Healed as of 10/30/16 f/u with Dr. Sharol Given.  . VENTRAL HERNIA   . Wound dehiscence 10/22/2019    Past Surgical History:  Procedure Laterality Date  . AMPUTATION Left 04/11/2013   Procedure: AMPUTATION DIGIT Left 3rd toe;  Surgeon: [name], MD;  Location: [facility];  Service: Orthopedics;  Laterality: Left;  Left 3rd toe amputation at [facility]  . AMPUTATION Left 08/29/2019   Procedure: LEFT TRANSMETATARSAL AMPUTATION;  Surgeon: [name], MD;  Location: [facility];  Service: Orthopedics;  Laterality: Left;  . CARDIAC CATHETERIZATION  1997; 03/10/16   1997 Non-obstructive disease.  03/2016 BMS to RCA, with 25% pDiag dz, o/w normal cors per cath 03/07/16.  Cath 11/01/16: in stent restenosis, successful baloon angioplasty.  [name] CARDIAC CATHETERIZATION   12/24/2012   mild < 20% LCx, prox 30% RCA; LVEF 55-65% , moderate pulmonary HTN, moderate AS  . CARDIAC DEFIBRILLATOR PLACEMENT  11/07/2017   [facility] MRI Quad CRT defibrillator  . CARDIOVASCULAR STRESS TEST  05/11/16 ([facility])   Myocardial perfusion imaging:  No ischemia; scar in apex, global hypokinesis, EF 36%.  . Carotid dopplers  03/09/2016   [facility]: no hemodynamically significant stenosis on either side.  . CHOLECYSTECTOMY    . COLONOSCOPY    . COLONOSCOPY [name] 10/30/2012   Procedure: COLONOSCOPY;  Surgeon: [name], MD;  Location: [facility];  Service: Endoscopy;  Laterality: [name];  . CORONARY ANGIOPLASTY WITH STENT PLACEMENT  03/2016; 04/2017   2017-[facility]: BMS to RCA-pt was placed on Brilinta.  04/2017: DES to RCA.  . DORSAL SLIT [name] 10/29/2019   for severe phimosis. Procedure: DORSAL SLIT;  Surgeon: Alexis Frock, MD;  Location: [facility];  Service: Urology;  Laterality: [name];  33 MINS  . EYE SURGERY Bilateral cataract  . HEMORRHOID SURGERY    . INTRAOCULAR LENS INSERTION Bilateral   . KNEE SURGERY Right   . LEFT AND RIGHT HEART CATHETERIZATION WITH CORONARY ANGIOGRAM [name] 12/24/2012   Procedure: LEFT AND RIGHT HEART CATHETERIZATION WITH CORONARY ANGIOGRAM;  Surgeon: [name], MD;  Location: [facility];  Service: Cardiovascular;  Laterality: [name];  . LEG SURGERY Bilateral    lenghtening   . PACEMAKER PLACEMENT  04/13/2016   2nd deg HB after TAVR, pt had DC MDT PPM placed.  [name] SHOULDER ARTHROSCOPY  08/30/2011   Procedure: ARTHROSCOPY SHOULDER;  Surgeon: [name], MD;  Location: [facility];  Service: Orthopedics;  Laterality: Right;  Right Shoulder Arthroscopy, Debridement, and Decompression  . SPINAL CORD STIMULATOR INSERTION [name] 09/10/2015   Procedure: LUMBAR SPINAL CORD STIMULATOR INSERTION;  Surgeon: [name], MD;  Location: [facility];  Service: Neurosurgery;  Laterality: [name];  . TEE WITHOUT CARDIOVERSION [name] 09/16/2019   Procedure: TRANSESOPHAGEAL ECHOCARDIOGRAM (TEE);   Surgeon: Skeet Latch, MD;  Location: [facility];  Service: Cardiovascular;  Laterality: [name];  . TEE WITHOUT CARDIOVERSION [name] 12/02/2019   Procedure: TRANSESOPHAGEAL ECHOCARDIOGRAM (TEE);  Surgeon: Geralynn Rile, MD;  Location: [facility];  Service: Cardiovascular;  Laterality: [name];  . 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  R big toe surg due to osteoarth  . TONSILLECTOMY    . traeculectomy Left    eye  . TRANSCATHETER AORTIC VALVE REPLACEMENT, TRANSFEMORAL  04/11/2016  . TRANSESOPHAGEAL ECHOCARDIOGRAM  03/09/2016; 09/2019   [facility]: EF 55-60%, PFO seen with bi-directional shunting, no thrombus in appendage.  09/2019 ->no valvular vegetations. Small patent foramen ovale with predominantly left to right shunting across the interatrial septum.  . TRANSTHORACIC ECHOCARDIOGRAM  01/2015; 01/2016; 05/18/16; 09/18/16, 05/2017, 08/2017   01/2015 No signif change in aortic stenosis (moderate).  01/2016 Severe LVH w/small LV cavity, EF 60-65%, grade I diast dysfxn.  05/2016 (s/p TAVR): EF 50-55%, grd I DD, biopros AV good.  08/2016--EF 50-55%, LV septal motion c/w conduction abnl, grd I DD,mild MS,bioprosth aortic valve well seated, w/trace AR. 05/2017 TTE EF 35%. 08/2017-EF 35%, mod diff hypokin LV, grd I DD, biopros AV good.   . TRANSTHORACIC ECHOCARDIOGRAM  04/2018; 09/2019   04/2018: EF 40-45%, mod diffuse LV hypokinesis, grd I DD, bioprosth AV well seated, no AS or AR. 09/2019 EF 60-65%, grd I DD, valves fine, including bioprosth AV  . VITRECTOMY      Social History   Tobacco Use  Smoking Status Never Smoker  Smokeless Tobacco Never Used    Social History   Substance and Sexual Activity  Alcohol Use No  . Alcohol/week: 0.0 standard drinks     Allergies  Allergen Reactions  . Brimonidine Tartrate Shortness Of Breath    Alphagan-Shortness of breath  . Brinzolamide Shortness Of Breath    AZOPT- Shortness of breath  . Latanoprost Shortness Of Breath    XALATAN- Shortness of  breath  . Nucynta [Tapentadol] Shortness Of Breath  . Sulfa Antibiotics Palpitations  . Timolol Maleate Shortness Of Breath and Other (See Comments)    TIMOPTIC- Aggravated asthma  . Diltiazem Swelling     leg swelling  . Rofecoxib Swelling     VIOXX- leg swelling  . Vancomycin Hives and Other (See Comments)    Possible "Red Man Syndrome"? > hives/blisters  . Codeine Other (See Comments)    Childhood reaction  . Tamsulosin Other (See Comments)    Dizziness   . Celecoxib Other (See Comments)    CELLBREX-confusion  . Colchicine Diarrhea    diarrhea  . Tape Rash    Current Facility-Administered Medications  Medication Dose Route Frequency Provider Last Rate Last Admin  . acetaminophen (TYLENOL) tablet 650 mg  650 mg Oral Q6H PRN Geralynn Rile, MD       Or  . acetaminophen (TYLENOL) suppository 650 mg  650 mg Rectal Q6H PRN Geralynn Rile, MD      . albuterol (PROVENTIL) (2.5 MG/3ML) 0.083% nebulizer solution 2.5 mg  2.5 mg Nebulization Q4H PRN Geralynn Rile, MD      . allopurinol (ZYLOPRIM) tablet 300 mg  300 mg Oral Daily Geralynn Rile, MD   300 mg at 12/03/19 0827  . ampicillin (OMNIPEN) 2 g in sodium chloride 0.9 % 100 mL IVPB  2 g Intravenous Q4H Geralynn Rile, MD 300 mL/hr at 12/03/19 0824 2 g at 12/03/19 0824  . aspirin EC tablet 81 mg  81 mg Oral Daily Geralynn Rile, MD   81 mg at 12/03/19 1610  . cefTRIAXone (ROCEPHIN) 2 g in sodium chloride 0.9 % 100 mL IVPB  2 g Intravenous Q12H Geralynn Rile, MD 200 mL/hr at 12/03/19 [phone]  2 g at 12/03/19 0442  . Chlorhexidine Gluconate Cloth 2 % PADS 6 each  6 each Topical Daily O'Neal, Cassie Freer, MD   6 each at 12/03/19 [phone]  . enoxaparin (LOVENOX) injection 145 mg  1 mg/kg Subcutaneous Q12H Geralynn Rile, MD   145 mg at 12/03/19 0407  . ezetimibe (ZETIA) tablet 10 mg  10 mg Oral Daily O'Neal, Cassie Freer, MD   10 mg at 12/03/19 0826  . finasteride (PROSCAR) tablet 5 mg   5 mg Oral QHS Geralynn Rile, MD   5 mg at 12/02/19 2012  . fluticasone (FLONASE) 50 MCG/ACT nasal spray 2 spray  2 spray Each Nare Daily PRN O'Neal, Cassie Freer, MD      . gabapentin (NEURONTIN) capsule 800 mg  800 mg Oral QID Geralynn Rile, MD   800 mg at 12/03/19 7408  . mometasone-formoterol (DULERA) 200-5 MCG/ACT inhaler 2 puff  2 puff Inhalation BID Geralynn Rile, MD   2 puff at 12/03/19 0719  . niacin (NIASPAN) CR tablet 1,000 mg  1,000 mg Oral BID Geralynn Rile, MD   1,000 mg at 12/03/19 0827  . nitroGLYCERIN (NITROSTAT) SL tablet 0.4 mg  0.4 mg Sublingual Q5 min PRN Geralynn Rile, MD      . ondansetron [facility]) tablet 4 mg  4 mg Oral Q6H PRN Geralynn Rile, MD       Or  . ondansetron [facility]) injection 4 mg  4 mg Intravenous Q6H PRN O'Neal, Cassie Freer, MD      . oxyCODONE-acetaminophen (PERCOCET/ROXICET) 5-325 MG per tablet 1 tablet  1 tablet Oral Q4H PRN Geralynn Rile, MD      . pantoprazole (PROTONIX) EC tablet 40 mg  40 mg Oral Daily Geralynn Rile, MD   40 mg at 12/03/19 0827  . rosuvastatin (CRESTOR) tablet 40 mg  40 mg Oral Daily Geralynn Rile, MD   40 mg at 12/03/19 0826  . sacubitril-valsartan (ENTRESTO) 24-26 mg per tablet  0.5 tablet Oral BID Geralynn Rile, MD   0.5 tablet at 12/03/19 0825  . senna-docusate (Senokot-S) tablet 1 tablet  1 tablet Oral BID Geralynn Rile, MD   1 tablet at 12/02/19 1003    Medications Prior to Admission  Medication Sig Dispense Refill Last Dose  . albuterol (PROVENTIL HFA;VENTOLIN HFA) 108 (90 BASE) MCG/ACT inhaler Inhale 2 puffs into the lungs 4 (four) times daily as needed for wheezing or shortness of breath.   Past Month at Unknown time  . allopurinol (ZYLOPRIM) 300 MG tablet TAKE 1 TABLET BY MOUTH ONCE DAILY WITH FOOD (Patient taking differently: Take 300 mg by mouth daily. ) 90 tablet 1 11/26/2019 at Unknown time  . amoxicillin-clavulanate (AUGMENTIN)  875-125 MG tablet Take 1 tablet by mouth 2 (two) times daily. (Patient taking differently: Take 1 tablet by mouth 2 (two) times daily. Start date 11/26/19) 20 tablet 1 11/26/2019 at Unknown time  . apixaban (ELIQUIS) 5 MG TABS tablet Take 5 mg by mouth 2 (two) times daily.   11/26/2019 at 12 midnight  . ASPIRIN LOW DOSE 81 MG EC tablet Take 81 mg by mouth daily.   11/26/2019 at Unknown time  . B Complex-C (B-COMPLEX WITH VITAMIN C) tablet Take 1 tablet by mouth daily. Take 1 tablet daily, 268m   11/26/2019 at Unknown time  . ezetimibe (ZETIA) 10 MG tablet take 1 tablet by mouth once daily (Patient taking differently: Take 10 mg by mouth daily. )  90 tablet 3 11/26/2019 at Unknown time  . finasteride (PROSCAR) 5 MG tablet TAKE 1 TABLET BY MOUTH EVERY DAY (Patient taking differently: Take 5 mg by mouth daily. ) 90 tablet 0 11/26/2019 at Unknown time  . fluticasone (FLONASE) 50 MCG/ACT nasal spray Place 2 sprays into both nostrils as needed for allergies or rhinitis.   Past Month at Unknown time  . Fluticasone-Salmeterol (ADVAIR) 250-50 MCG/DOSE AEPB Inhale 1 puff into the lungs 2 (two) times daily.   11/26/2019 at Unknown time  . gabapentin (NEURONTIN) 800 MG tablet TAKE 1 TABLET (800 MG TOTAL) BY MOUTH 4 (FOUR) TIMES DAILY. 360 tablet 2 11/26/2019 at Unknown time  . isosorbide mononitrate (IMDUR) 60 MG 24 hr tablet TAKE 1 TABLET BY MOUTH EVERY DAY (Patient taking differently: Take 30 mg by mouth in the morning and at bedtime. ) 90 tablet 3 11/26/2019 at Unknown time  . metoprolol succinate (TOPROL-XL) 25 MG 24 hr tablet Take 25 mg by mouth daily. Takes along with 50 mg tablet (total 75 mg)   11/26/2019 at 12 noon  . metoprolol succinate (TOPROL-XL) 50 MG 24 hr tablet Take 50 mg by mouth daily. Take along with the 54m tablet to make total dose of 725m  11/26/2019 at 12 noon  . Multiple Vitamin (MULTIVITAMIN WITH MINERALS) TABS tablet Take 1 tablet by mouth daily.   11/26/2019 at Unknown time  . niacin (NIASPAN)  1000 MG CR tablet TAKE 1 TABLET BY MOUTH TWICE A DAY (Patient taking differently: Take 1,000 mg by mouth 2 (two) times daily. ) 180 tablet 1 11/26/2019 at Unknown time  . nitroGLYCERIN (NITROSTAT) 0.4 MG SL tablet Place 0.4 mg under the tongue every 5 (five) minutes as needed for chest pain.    unk at [facility]. omeprazole (PRILOSEC) 10 MG capsule TAKE 1 CAPSULE BY MOUTH EVERY DAY (Patient taking differently: Take 10 mg by mouth daily. ) 90 capsule 0 11/26/2019 at Unknown time  . rosuvastatin (CRESTOR) 40 MG tablet take 1 tablet by mouth once daily (Patient taking differently: Take 40 mg by mouth daily. ) 15 tablet 0 11/26/2019 at Unknown time  . sacubitril-valsartan (ENTRESTO) 24-26 MG Take 0.5 tablets by mouth 2 (two) times daily.    11/26/2019 at Unknown time  . torsemide (DEMADEX) 20 MG tablet Take 20 mg by mouth 2 (two) times daily as needed (swelling).    unk at unk  . vitamin C (ASCORBIC ACID) 250 MG tablet Take 250 mg by mouth daily.   11/26/2019 at Unknown time  . VITAMIN D, CHOLECALCIFEROL, PO Take 1 tablet by mouth daily.    11/26/2019 at Unknown time  . oxyCODONE-acetaminophen (PERCOCET) 5-325 MG tablet Take 1 tablet by mouth every 4 (four) hours as needed for moderate pain or severe pain. Post-operatively (Patient not taking: Reported on 11/27/2019) 20 tablet 0 Not Taking at Unknown time  . senna-docusate (SENOKOT-S) 8.6-50 MG tablet Take 1 tablet by mouth 2 (two) times daily. While taking strong pain meds to prevent constipation. (Patient not taking: Reported on 11/27/2019) 10 tablet 0 Not Taking at Unknown time    Family History  Problem Relation Age of Onset  . Hypertension Mother   . Coronary artery disease Mother   . Heart attack Mother   . Neuropathy Mother   . Pulmonary fibrosis Father        asbestosis     Review of Systems:   Review of Systems  Constitutional: Positive for chills, diaphoresis, fever and weight loss.  Negative for malaise/fatigue.  HENT: Positive for hearing loss.  Negative for congestion, ear discharge, ear pain, nosebleeds, sinus pain, sore throat and tinnitus.   Eyes: Negative for blurred vision, double vision, photophobia, pain, discharge and redness.  Respiratory: Negative for cough, hemoptysis, sputum production, shortness of breath, wheezing and stridor.   Cardiovascular: Negative for chest pain, palpitations, orthopnea, claudication, leg swelling and PND.  Gastrointestinal: Negative for abdominal pain, blood in stool, constipation, diarrhea, heartburn, melena, nausea and vomiting.  Genitourinary: Positive for frequency and urgency. Negative for dysuria, flank pain and hematuria.  Musculoskeletal: Positive for back pain. Negative for falls, joint pain, myalgias and neck pain.  Skin: Negative.   Neurological: Positive for dizziness and weakness. Negative for tingling, tremors, sensory change, speech change, focal weakness, seizures, loss of consciousness and headaches.  Endo/Heme/Allergies: Positive for environmental allergies. Negative for polydipsia. Bruises/bleeds easily.  Psychiatric/Behavioral: Positive for hallucinations, memory loss and substance abuse. Negative for depression and suicidal ideas. The patient is not nervous/anxious and does not have insomnia.         Physical Exam: BP (!) 118/57 (BP Location: Left Arm)   Pulse 88   Temp 98.2 F (36.8 C) (Oral)   Resp 20   Wt (!) 144.3 kg   SpO2 98%   BMI 43.15 kg/m    Physical Exam  Constitutional: No distress. He appears chronically ill.  HENT:  Nose: No nasal discharge.  Mouth/Throat: Dental caries present. Pharynx is abnormal.  Fair dentition echymosis posterior pharynx  Eyes: Pupils are equal, round, and reactive to light. Conjunctivae are normal.  Neck: Thyroid normal. No JVD present. No neck adenopathy. No thyromegaly present.  Cardiovascular: Normal rate, regular rhythm, S1 normal and S2 normal.  Murmur heard.  Systolic murmur is present with a grade of 2/6. Pulses:       Dorsalis pedis pulses are 0 on the right side and 0 on the left side.       Posterior tibial pulses are 0 on the right side and 1+ on the left side.  Pulmonary/Chest: Breath sounds normal. He has no wheezes. He has no rales. He exhibits no tenderness.  Abdominal: Soft. He exhibits no distension and no mass. There is no hepatomegaly. There is no abdominal tenderness.  obese  Musculoskeletal:        General: Deformity and edema present. No tenderness.     Cervical back: Neck supple.     Comments: Chronic venous stasis changes bilat LE's Left transmet amputation- dressing in place, not removed  Neurological: He is alert and oriented to person, place, and time. He has normal motor skills.  Skin: Skin is warm and dry. No rash noted. No cyanosis. No jaundice or pallor. Nails show no clubbing.    Diagnostic Studies & Laboratory data:     Recent Radiology Findings:   ECHO TEE  Result Date: 12/02/2019    TRANSESOPHOGEAL ECHO REPORT   Patient Name:   Tim [name] Date of Exam: 12/02/2019 Medical Rec #:  [id number]       Height:       72.0 in Accession #:    [id number]      Weight:       318.1 lb Date of Birth:  1940-08-19       BSA:          2.595 m Patient Age:    34 years        BP:           120/43 mmHg  Patient Gender: M               HR:           81 bpm. Exam Location:  Inpatient Procedure: Transesophageal Echo, 3D Echo, Color Doppler and Cardiac Doppler Indications:     Bacteremia  History:         Patient has prior history of Echocardiogram examinations, most                  recent 09/16/2019. Pacemaker and Defibrillator, Pulmonary HTN                  and COPD, TAVR; Risk Factors:Hypertension, Diabetes,                  Dyslipidemia and Sleep Apnea.                  Aortic Valve: 29 mm Edwards Sapien prosthetic, stented (TAVR)                  valve is present in the aortic position.  Sonographer:     Raquel Sarna Senior RDCS Referring Phys:  [id number] [place] Diagnosing Phys: Eleonore Chiquito MD  PROCEDURE: After discussion of the risks and benefits of a TEE, an informed consent was obtained from the patient. TEE procedure time was 25 minutes. The transesophogeal probe was passed without difficulty through the esophogus of the patient. Imaged were obtained with the patient in a left lateral decubitus position. Local oropharyngeal anesthetic was provided with Cetacaine. Sedation performed by different physician. The patient was monitored while under deep sedation. Anesthestetic sedation was provided intravenously by Anesthesiology: 397m of Propofol. Image quality was excellent. The patient's vital signs; including heart rate, blood pressure, and oxygen saturation; remained stable throughout the procedure. The patient developed no complications during the procedure. IMPRESSIONS  1. There is a large mobile vegetation attached to the TAVR valve. It measures 1.7 cm x 0.5 cm. It appears to be attached to the leaflet of the RCC. 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 There is no regurgitation or paravalvular leak noted. The gradients are stable, although slightly elevated. The aortic valve has been repaired/replaced. Aortic valve regurgitation is not visualized. There is a 29 mm Edwards Sapien prosthetic (TAVR) valve present in the aortic position. Aortic valve area, by VTI measures 2.06 cm. Aortic valve mean gradient measures 18.0 mmHg. Aortic valve Vmax measures 2.68 m/s.  2. There is a mobile vegetation attached the pacer lead in the RV. It measures 1.4 cm x 1.2 cm. 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 Right ventricular systolic function is normal. The right ventricular size is normal.  3. Left ventricular ejection fraction, by estimation, is 55 to 60%. The left ventricle has normal function. The left ventricle has no regional wall motion abnormalities.  4. No left atrial/left atrial appendage thrombus was  detected. The LAA emptying velocity was 41 cm/s.  5. The mitral valve is degenerative. Mild mitral valve regurgitation. No evidence of mitral stenosis.  6. Evidence of atrial level shunting detected by color flow Doppler. There is a small patent foramen ovale with predominantly left to right shunting across the atrial septum.  7. There is mild (Grade II) layered plaque involving the ascending aorta and transverse aorta. FINDINGS  Left Ventricle: Left ventricular ejection fraction, by estimation, is 55 to 60%. The left ventricle has normal function. The left ventricle has  no regional wall motion abnormalities. The left ventricular internal cavity size was normal in size. There is  no left ventricular hypertrophy. Right Ventricle: There is a mobile vegetation attached the pacer lead in the RV. It measures 1.4 cm x 1.2 cm. 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 The right ventricular size is normal. No increase in right ventricular wall thickness. Right ventricular systolic function is normal. Left Atrium: Left atrial size was normal in size. No left atrial/left atrial appendage thrombus was detected. The LAA emptying velocity was 41 cm/s. Right Atrium: Right atrial size was normal in size. Pericardium: Trivial pericardial effusion is present. Mitral Valve: The mitral valve is degenerative in appearance. Mild mitral valve regurgitation. No evidence of mitral valve stenosis. There is no evidence of mitral valve vegetation. Tricuspid Valve: The tricuspid valve is grossly normal. Tricuspid valve regurgitation is mild . No evidence of tricuspid stenosis. Aortic Valve: There is a large mobile vegetation attached to the TAVR valve. It measures 1.7 cm x 0.5 cm. It appears to be attached to the leaflet of the RCC. 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 There is no regurgitation or paravalvular leak noted. The gradients are  stable, although slightly elevated. The aortic valve has been repaired/replaced. Aortic valve regurgitation is not visualized. Aortic valve mean gradient measures 18.0 mmHg. Aortic valve peak gradient measures 28.7 mmHg. Aortic valve area, by VTI measures 2.06 cm. 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 Pulmonic Valve: The pulmonic valve was grossly normal. Pulmonic valve regurgitation is not visualized. No evidence of pulmonic stenosis. There is no evidence of pulmonic valve vegetation. Aorta: The aortic root and ascending aorta are structurally normal, with no evidence of dilitation. There is mild (Grade II) layered plaque involving the ascending aorta and transverse aorta. Venous: The left upper pulmonary vein, left lower pulmonary vein, right upper pulmonary vein and right lower pulmonary vein are normal. IAS/Shunts: There is redundancy of the interatrial septum. Evidence of atrial level shunting detected by color flow Doppler. A small patent foramen ovale is detected with predominantly left to right shunting across the atrial septum. Additional Comments: A pacer wire is visualized in the right atrium and right ventricle.  LEFT VENTRICLE PLAX 2D LVOT diam:     2.40 cm LV SV:         120 LV SV Index:   46 LVOT Area:     4.52 cm  AORTIC VALVE AV Area (Vmax):    2.25 cm AV Area (Vmean):   2.21 cm AV Area (VTI):     2.06 cm AV Vmax:           268.00 cm/s AV Vmean:          201.000 cm/s AV VTI:            0.584 m AV Peak Grad:      28.7 mmHg AV Mean Grad:      18.0 mmHg LVOT Vmax:         133.50 cm/s LVOT Vmean:        98.350 cm/s LVOT VTI:          0.266 m LVOT/AV VTI ratio: 0.45  AORTA Ao Asc diam: 3.20 cm TRICUSPID VALVE TR Peak grad:   27.0 mmHg TR Vmax:        260.00 cm/s  SHUNTS Systemic VTI:  0.27 m Systemic Diam: 2.40 cm  Eleonore Chiquito MD Electronically signed by Eleonore Chiquito MD Signature Date/Time: 12/02/2019/9:31:33 AM    Final      I have  independently reviewed the above radiologic studies and discussed with the patient   Recent Lab Findings: Lab Results  Component Value Date   WBC 5.9 12/03/2019   HGB 10.1 (L) 12/03/2019   HCT 31.6 (L) 12/03/2019   PLT 168 12/03/2019   GLUCOSE 144 (H) 12/03/2019   CHOL 107 12/25/2018   TRIG 72.0 12/25/2018   HDL 62.70 12/25/2018   LDLCALC 29 12/25/2018   ALT 32 11/28/2019   AST 42 (H) 11/28/2019   NA 141 12/03/2019   K 4.5 12/03/2019   CL 110 12/03/2019   CREATININE 0.65 12/03/2019   BUN 8 12/03/2019   CO2 24 12/03/2019   TSH 0.96 09/16/2018   INR 1.2 09/11/2019   HGBA1C 5.6 10/02/2019    Results for orders placed or performed during the hospital encounter of 11/27/19  SARS Coronavirus 2 by RT PCR (hospital order, performed in [facility] hospital lab) Nasopharyngeal Nasopharyngeal Swab     Status: None   Collection Time: 11/27/19  5:05 PM   Specimen: Nasopharyngeal Swab  Result Value Ref Range Status   SARS Coronavirus 2 NEGATIVE NEGATIVE Final    Comment: (NOTE) SARS-CoV-2 target nucleic acids are NOT DETECTED. The SARS-CoV-2 RNA is generally detectable in upper and lower respiratory specimens during the acute phase of infection. The lowest concentration of SARS-CoV-2 viral copies this assay can detect is 250 copies / mL. A negative result does not preclude SARS-CoV-2 infection and should not be used as the sole basis for treatment or other patient management decisions.  A negative result may occur with improper specimen collection / handling, submission of specimen other than nasopharyngeal swab, presence of viral mutation(s) within the areas targeted by this assay, and inadequate number of viral copies (<250 copies / mL). A negative result must be combined with clinical observations, patient history, and epidemiological information. Fact Sheet for Patients:   StrictlyIdeas.no Fact Sheet for Healthcare  Providers: BankingDealers.co.za This test is not yet approved or cleared  by the [place] FDA and has been authorized for detection and/or diagnosis of SARS-CoV-2 by FDA under an Emergency Use Authorization (EUA).  This EUA will remain in effect (meaning this test can be used) for the duration of the COVID-19 declaration under Section 564(b)(1) of the Act, 21 U.S.C. section 360bbb-3(b)(1), unless the authorization is terminated or revoked sooner. Performed at [facility], [place] [address] [place], [place] [address]   Culture, blood (routine x 2)     Status: None   Collection Time: 11/27/19  6:24 PM   Specimen: BLOOD RIGHT HAND  Result Value Ref Range Status   Specimen Description BLOOD RIGHT HAND  Final   Special Requests   Final    BOTTLES DRAWN AEROBIC AND ANAEROBIC Blood Culture results may not be optimal due to an inadequate volume of blood received in culture bottles   Culture   Final    NO GROWTH 5 DAYS Performed at [facility], [place] [address], [place], [place] [address]    Report Status 12/02/2019 FINAL  Final  Culture, blood (routine x 2)     Status: None   Collection Time: 11/28/19  5:46 AM   Specimen: BLOOD  Result Value Ref Range Status   Specimen Description BLOOD LEFT ANTECUBITAL  Final   Special Requests   Final    BOTTLES DRAWN AEROBIC  ONLY Blood Culture adequate volume   Culture   Final    NO GROWTH 5 DAYS Performed at [facility], [place] [address] [place], [place] [address]    Report Status 12/03/2019 FINAL  Final  Urine Culture     Status: Abnormal   Collection Time: 11/29/19  3:27 AM   Specimen: Urine, Random  Result Value Ref Range Status   Specimen Description URINE, RANDOM  Final   Special Requests NONE  Final   Culture (A)  Final    <10,000 COLONIES/mL INSIGNIFICANT GROWTH Performed at [facility], [place] [address]  [place], [place] [address]    Report Status 11/30/2019 FINAL  Final   *Note: Due  to a large number of results and/or encounters for the requested time period, some results have not been displayed. A complete set of results can be found in Results Review.    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         I  spent 60 minutes counseling the patient face to face.    [name] Giovanni, PA-C 12/03/2019 10:26 AM   Chart and echo reviewed, patient examined. This 79 year old gentleman from [place] had TAVR in 2017 at [facility] after he presented with acute chest pain and thought he was having an MI. 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 TEE at that time negative for endocarditis. He was treated with intravenous ampicillin until 09/26/2019 and then completed a course of doxycycline and Zyvox for suspected cellulitis near his amputation site.  He additionally has undergone a dorsal slit procedure by Dr. Barbee Cough in April for severe phimosis and has had a Foley catheter until recently when it was removed by the urology clinic. He now presented with fevers and weakness and admitted for possible bacteremia. BC negative so far but TEE now shows vegetation on the TAVR valve but valve is functioning well with no AI or paravalvular leak. Mean gradient 18. He is 79 years old with multiple comorbidities including super morbid obesity, transmet amputation with debilitation and deconditioning, malnutrition, CAD and ischemic cardiomyopathy, pacemaker dependent rhythm that would make surgical removal of the TAVR valve and aortic root replacement extremely high risk with a slim chance of functional recovery. I would recommend non-surgical treatment with prolonged  antibiotics. I discussed all of this with the patient and his wife and answered their questions.

## 2019-12-03 NOTE — Progress Notes (Signed)
Patient places self on/off CPAP without assistance. RT will monitor as needed.

## 2019-12-03 NOTE — Progress Notes (Signed)
Structural Heart Team:  Please see my full consult note from yesterday.  He is not a candidate for further percutaneous approach to his AV disease.  Given the bacteremia and endocarditis, his only option for aortic valve replacement would be with open surgery. The patient does not appear to be willing to pursue open surgery even if were offered to him.  I have reviewed his case with Dr. Cyndia Bent with CT surgery and he will place a consult.  Would recommend suppressive antibiotics for now.  Cardiology will sign off. Please call with questions.   Lauree Chandler 12/03/2019 9:37 AM

## 2019-12-03 NOTE — Progress Notes (Signed)
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 Electrophysiology and CVTS recommend medical treatment as surgical intervention given age and co-morbidities poses a very high risk. Discussed medical management to include 6 weeks of dual beta-lactam therapy with ceftriaxone and ampicillin followed by life long suppression likely with amoxicillin. Educated that this does pose an increased risk for emboli especially during the first several weeks of treatment. Arrange for PICC line placement today. Continue current dose of ceftriaxone and ampicillin.   PLAN:  1. Continue ampicillin and ceftriaxone.  2. PICC line placement. 3. Will need 6 weeks of therapy followed by chronic suppression.    Principal Problem:   Gram-positive bacteremia Active Problems:   GOUT   Obesity   Essential hypertension, benign   COPD (chronic obstructive pulmonary disease) (HCC)   Pulmonary hypertension (HCC)   Hereditary peripheral neuropathy   Aortic stenosis   Paroxysmal atrial fibrillation (HCC)   Chronic pain syndrome   Enterococcal bacteremia history    ICD (implantable cardioverter-defibrillator) in place   S/P TAVR (transcatheter aortic valve replacement)   . allopurinol  300 mg Oral Daily  . aspirin EC  81 mg Oral Daily  . Chlorhexidine Gluconate Cloth  6 each Topical Daily  . enoxaparin (LOVENOX) injection  1 mg/kg Subcutaneous Q12H  . ezetimibe  10 mg Oral Daily  . finasteride  5 mg Oral QHS  . gabapentin  800 mg Oral QID  . mometasone-formoterol  2 puff Inhalation BID  . niacin  1,000 mg Oral BID  . pantoprazole  40 mg Oral Daily  . rosuvastatin  40 mg Oral Daily  . sacubitril-valsartan  0.5 tablet Oral BID  . senna-docusate  1 tablet Oral BID    SUBJECTIVE:  Afebrile overnight with no acute events.   Allergies    Allergen Reactions  . Brimonidine Tartrate Shortness Of Breath    Alphagan-Shortness of breath  . Brinzolamide Shortness Of Breath    AZOPT- Shortness of breath  . Latanoprost Shortness Of Breath    XALATAN- Shortness of breath  . Nucynta [Tapentadol] Shortness Of Breath  . Sulfa Antibiotics Palpitations  . Timolol Maleate Shortness Of Breath and Other (See Comments)    TIMOPTIC- Aggravated asthma  . Diltiazem Swelling     leg swelling  . Rofecoxib Swelling     VIOXX- leg swelling  . Vancomycin Hives and Other (See Comments)    Possible "Red Man Syndrome"? > hives/blisters  . Codeine Other (See Comments)    Childhood reaction  . Tamsulosin Other (See Comments)    Dizziness   . Celecoxib Other (See Comments)    CELLBREX-confusion  . Colchicine Diarrhea    diarrhea  . Tape Rash     Review of Systems: Review of Systems  Constitutional: Negative for chills, fever and weight loss.  Respiratory: Negative for cough, shortness of breath and wheezing.   Cardiovascular: Negative for chest pain and leg swelling.  Gastrointestinal: Negative for abdominal pain, constipation, diarrhea, nausea and vomiting.  Skin: Negative for rash.      OBJECTIVE: Vitals:   12/02/19 2057 12/03/19 0402 12/03/19 0719 12/03/19 0720  BP:  (!) 118/57    Pulse:  85 88   Resp:  18 20   Temp:  98.2 F (36.8 C)    TempSrc:  Oral  SpO2: 98% 98% 98% 98%  Weight:       Body mass index is 43.15 kg/m.  Physical Exam  Lab Results Lab Results  Component Value Date   WBC 5.9 12/03/2019   HGB 10.1 (L) 12/03/2019   HCT 31.6 (L) 12/03/2019   MCV 92.1 12/03/2019   PLT 168 12/03/2019    Lab Results  Component Value Date   CREATININE 0.65 12/03/2019   BUN 8 12/03/2019   NA 141 12/03/2019   K 4.5 12/03/2019   CL 110 12/03/2019   CO2 24 12/03/2019    Lab Results  Component Value Date   ALT 32 11/28/2019   AST 42 (H) 11/28/2019   ALKPHOS 55 11/28/2019   BILITOT 0.9 11/28/2019      Microbiology: Recent Results (from the past 240 hour(s))  Culture, blood (single)     Status: Abnormal   Collection Time: 11/26/19  2:40 PM   Specimen: Blood  Result Value Ref Range Status   MICRO NUMBER: [id number]  Final   SPECIMEN QUALITY: Adequate  Final   Source BLOOD 1  Final   STATUS: FINAL  Final   ISOLATE 1: Enterococcus faecalis (A)  Final    Comment: Enterococcus faecalis , from aerobic and anaerobic bottles   COMMENT: Aerobic and anaerobic bottle received.  Final      Susceptibility   Enterococcus faecalis - BLOOD CULTURE, POSITIVE 1    AMPICILLIN <=2 Sensitive     VANCOMYCIN 1 Sensitive     GENTAMICIN SYNERGY*  Sensitive      * This organism does not exhibit high levelresistance to gentamicin which indicates thatsynergy of gentamicin with a penicillin is likely.Legend:S = Susceptible  I = IntermediateR = Resistant  NS = Not susceptible* = Not tested  NR = Not reported**NN = See antimicrobic comments  Culture, blood (single)     Status: Abnormal   Collection Time: 11/26/19  2:41 PM   Specimen: Blood  Result Value Ref Range Status   MICRO NUMBER: [id number]  Final   SPECIMEN QUALITY: Adequate  Final   Source BLOOD 2  Final   STATUS: FINAL  Final   ISOLATE 1: Enterococcus faecalis (A)  Final    Comment: Enterococcus faecalis , from aerobic and anaerobic bottles   COMMENT: Aerobic and anaerobic bottle received.  Final      Susceptibility   Enterococcus faecalis - BLOOD CULTURE, POSITIVE 1    AMPICILLIN <=2 Sensitive     VANCOMYCIN 1 Sensitive     GENTAMICIN SYNERGY*  Sensitive      * This organism does not exhibit high levelresistance to gentamicin which indicates thatsynergy of gentamicin with a penicillin is likely.Legend:S = Susceptible  I = IntermediateR = Resistant  NS = Not susceptible* = Not tested  NR = Not reported**NN = See antimicrobic comments  SARS Coronavirus 2 by RT PCR (hospital order, performed in [facility] hospital lab) Nasopharyngeal Nasopharyngeal  Swab     Status: None   Collection Time: 11/27/19  5:05 PM   Specimen: Nasopharyngeal Swab  Result Value Ref Range Status   SARS Coronavirus 2 NEGATIVE NEGATIVE Final    Comment: (NOTE) SARS-CoV-2 target nucleic acids are NOT DETECTED. The SARS-CoV-2 RNA is generally detectable in upper and lower respiratory specimens during the acute phase of infection. The lowest concentration of SARS-CoV-2 viral copies this assay can detect is 250 copies / mL. A negative result does not preclude SARS-CoV-2 infection and should not be used as the sole basis for  treatment or other patient management decisions.  A negative result may occur with improper specimen collection / handling, submission of specimen other than nasopharyngeal swab, presence of viral mutation(s) within the areas targeted by this assay, and inadequate number of viral copies (<250 copies / mL). A negative result must be combined with clinical observations, patient history, and epidemiological information. Fact Sheet for Patients:   StrictlyIdeas.no Fact Sheet for Healthcare Providers: BankingDealers.co.za This test is not yet approved or cleared  by the [place] FDA and has been authorized for detection and/or diagnosis of SARS-CoV-2 by FDA under an Emergency Use Authorization (EUA).  This EUA will remain in effect (meaning this test can be used) for the duration of the COVID-19 declaration under Section 564(b)(1) of the Act, 21 U.S.C. section 360bbb-3(b)(1), unless the authorization is terminated or revoked sooner. Performed at [facility], [place] [address] [place], [place] [address]   Culture, blood (routine x 2)     Status: None   Collection Time: 11/27/19  6:24 PM   Specimen: BLOOD RIGHT HAND  Result Value Ref Range Status   Specimen Description BLOOD RIGHT HAND  Final   Special Requests   Final    BOTTLES DRAWN AEROBIC AND ANAEROBIC Blood Culture results may not  be optimal due to an inadequate volume of blood received in culture bottles   Culture   Final    NO GROWTH 5 DAYS Performed at [facility], [place] [address], [place], [place] [address]    Report Status 12/02/2019 FINAL  Final  Culture, blood (routine x 2)     Status: None   Collection Time: 11/28/19  5:46 AM   Specimen: BLOOD  Result Value Ref Range Status   Specimen Description BLOOD LEFT ANTECUBITAL  Final   Special Requests   Final    BOTTLES DRAWN AEROBIC ONLY Blood Culture adequate volume   Culture   Final    NO GROWTH 5 DAYS Performed at [facility], [place] [address] [place], [place] [address]    Report Status 12/03/2019 FINAL  Final  Urine Culture     Status: Abnormal   Collection Time: 11/29/19  3:27 AM   Specimen: Urine, Random  Result Value Ref Range Status   Specimen Description URINE, RANDOM  Final   Special Requests NONE  Final   Culture (A)  Final    <10,000 COLONIES/mL INSIGNIFICANT GROWTH Performed at [facility], [place] [address] [place], [place] [address]    Report Status 11/30/2019 FINAL  Final     Terri Piedra, NP [facility]  12/03/2019  1:34 PM

## 2019-12-03 NOTE — Progress Notes (Signed)
Peripherally Inserted Central Catheter Placement  The IV Nurse has discussed with the patient and/or persons authorized to consent for the patient, the purpose of this procedure and the potential benefits and risks involved with this procedure.  The benefits include less needle sticks, lab draws from the catheter, and the patient may be discharged home with the catheter. Risks include, but not limited to, infection, bleeding, blood clot (thrombus formation), and puncture of an artery; nerve damage and irregular heartbeat and possibility to perform a PICC exchange if needed/ordered by physician.  Alternatives to this procedure were also discussed.  Bard Power PICC patient education guide, fact sheet on infection prevention and patient information card has been provided to patient /or left at bedside.    PICC Placement Documentation  PICC Single Lumen 09/17/19 PICC Right Cephalic 44 cm 0 cm (Active)     PICC Double Lumen 89/16/94 PICC Right Basilic 46 cm 0 cm (Active)  Indication for Insertion or Continuance of Line Prolonged intravenous therapies 12/03/19 1621  Exposed Catheter (cm) 0 cm 12/03/19 1621  Site Assessment Clean;Dry;Intact 12/03/19 1621  Lumen #1 Status Flushed;Blood return noted 12/03/19 1621  Lumen #2 Status Flushed;Blood return noted 12/03/19 1621  Dressing Type Transparent 12/03/19 1621  Dressing Status Clean;Dry;Intact;Antimicrobial disc in place;Other (Comment) 12/03/19 1621  Line Adjustment (NICU/IV Team Only) Yes 12/03/19 1621  Dressing Intervention New dressing 12/03/19 5038  Dressing Change Due 12/10/19 12/03/19 1621       Tim [name] 12/03/2019, 4:22 PM

## 2019-12-03 NOTE — Progress Notes (Signed)
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 -Subsequently underwent dorsal slit procedure by Dr. Tresa Moore on 4/28 for severe phimosis with buried penis,had a Foley catheter thereafter, Foley catheter was removed about a week agoinurology clinic. -He is chronically relatively debilitated and spends most of his time in a recliner, is able to ambulate a little using a walker at home. -He reports weakness dizziness and fevers off and on for the past 2 weeks, temperatures as high as 102,he was seen in the infectious disease clinic yesterday with the symptoms and had blood cultures drawn, he was asked to start oral Augmentin of which she took 2 doses,Dr. Tommy Medal was concerned about the possibility of bacteremia and/or endocarditis. -His blood cultures today grew gram-positive cocci in chains and hence was asked to come back to the emergency room. -He denies any pain swelling or discomfort at his recent amputation site -He was ordered to get repeat blood cultures and given a dose of daptomycin per ID recommendations  Consultants  . Infectious Disease . Cardiothoracic surgery . Cardiology . Electrophysiology  Procedures  . TEE  Antibiotics    Anti-infectives (From admission, onward)   Start     Dose/Rate Route Frequency Ordered Stop   11/28/19 1600  cefTRIAXone (ROCEPHIN) 2 g in sodium chloride 0.9 % 100 mL IVPB     2 g 200 mL/hr over 30 Minutes Intravenous Every 12 hours 11/28/19 1333     11/28/19 1345  ampicillin (OMNIPEN) 2 g in sodium chloride 0.9 % 100 mL IVPB     2 g 300 mL/hr over 20 Minutes Intravenous Every 4 hours 11/28/19 1333     11/27/19 1730  DAPTOmycin (CUBICIN) 1,000 mg in sodium chloride 0.9 % IVPB  Status:  Discontinued     1,000 mg 240 mL/hr over 30 Minutes Intravenous Daily 11/27/19 1655 11/28/19 1333   11/27/19 1700  DAPTOmycin (CUBICIN) 833.5 mg in sodium chloride 0.9 % IVPB  Status:  Discontinued     8 mg/kg  104.2 kg (Adjusted) 233.3 mL/hr over 30 Minutes Intravenous Daily 11/27/19 1657 11/27/19 1658    .  Subjective  The patient is resting comfortably. No new complaints.  Objective   Vitals:  Vitals:   12/03/19 0720 12/03/19 1350  BP:  (!) 124/50  Pulse:  74  Resp:  15  Temp:  97.7 F (36.5 C)  SpO2: 98% 100%    Exam:  Constitutional:  . The patient is awake, alert, and oriented x 3. No acute distress. Respiratory:  . No increased work of breathing. . No wheezes, rales, or rhonchi . No tactile fremitus Cardiovascular:  . Regular rate and rhythm . No murmurs, ectopy, or gallups. . No lateral PMI. No thrills. Abdomen:  . Abdomen is soft, non-tender, non-distended . No hernias, masses, or organomegaly . Normoactive  bowel sounds.  Musculoskeletal:  . No cyanosis, clubbing, or edema Skin:  . No rashes, lesions, ulcers . palpation of skin: no induration or nodules Neurologic:  . CN 2-12 intact . Sensation all 4 extremities intact Psychiatric:  . Mental status o Mood, affect appropriate o Orientation to person, place, time  . judgment and insight appear intact  I have personally reviewed the following:   Today's Data  . Vitals, CBC, BMP  Micro Data  . Blood  Cultures x 2 (11/26/2019): E. Faecalis . Blood Cultures x 2 (11/27/2019): No growth . Urine Culture: Less than 10K, insignificant growth  Cardiology Data  . TEE (12/02/2019): Large mobile vegetation attached to the TAVR valve. No regurgitation or paravalvular leak noted. There is a mobile vegetation attached to the pacer lead in the RV. Normal RV function and ventricular size. EF is 55 - 60%. There is no evidence of a mitral valve vegetation. There is no evidence of a pulmonic vegetation.  Scheduled Meds: . allopurinol  300 mg Oral Daily  . aspirin EC  81 mg Oral Daily  . Chlorhexidine Gluconate Cloth  6 each Topical Daily  . enoxaparin (LOVENOX) injection  1 mg/kg Subcutaneous Q12H  . ezetimibe  10 mg Oral Daily  . finasteride  5 mg Oral QHS  . gabapentin  800 mg Oral QID  . mometasone-formoterol  2 puff Inhalation BID  . niacin  1,000 mg Oral BID  . pantoprazole  40 mg Oral Daily  . rosuvastatin  40 mg Oral Daily  . sacubitril-valsartan  0.5 tablet Oral BID  . senna-docusate  1 tablet Oral BID   Continuous Infusions: . ampicillin (OMNIPEN) IV 2 g (12/03/19 1540)  . 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  Noted over the past 1-2 weeks to be sluggish with low-grade fevers.   Patient seen in ID clinic on 11/26/2019 with repeat blood cultures concerning for gram-positive cocci and subsequently admitted back to the hospital for antibiotic treatment and evaluation for endocarditis.  Patient received a dose of IV daptomycin.  Preliminary cultures from 11/26/2019 + for Enterococcus faecalis.  Repeat blood cultures pending.  Patient with history of TAVR.  Cardiology consulted by ID for evaluation for TEE to rule out endocarditis which was done this morning 12/02/2019 which was consistent to TAVR valve infection and PPM lead infection.  Consult with cardiology for further evaluation and recommendations.  Patient initially on IV daptomycin which has been narrowed down to IV ampicillin and IV Rocephin per ID recommendations.  ID following and I appreciate the input and recommendations.   Coronary artery disease/ischemic cardiomyopathy EF 30%/status post TAVR and AICD/pulmonary hypertension:  Currently euvolemic.  Continue aspirin, Zetia,Crestor, Entresto.  Patient states had taken himself off his beta-blocker due to low blood pressure.  Toprol-XL has been discontinued.  Continue Entresto.  Follow.   COPD: Dulera.  Nebs as needed.   Chronic pain/peripheral neuropathy: Neurontin.   Morbid obesity: Complicates all cares. BMI of 43.15.  Hypertension: Blood pressure borderline. Patient asymptomatic.  Toprol-XL discontinued.  Proscar changed to nightly.  Continue Entresto.  Follow.    History of gout: Allopurinol.   Paroxysmal atrial fibrillation: Patient noted to have taken himself off Toprol-XL due to low blood pressure.  Toprol-XL has been discontinued.  Some improvement with blood pressure.  Continue full dose Lovenox for anticoagulation for now until no further procedures are planned and then can subsequently transition back to home dose oral anticoagulation.  Hypokalemia/hypomagnesemia: K. Dur 40 mEq p.o. x1.  Magnesium sulfate 4 g IV x1.   I have seen and examined this  patient myself. I have spent 34 minutes in his evaluation and care.  DVT prophylaxis: Lovenox Code Status: DNR Family Communication: Updated patient.  No family at bedside. Disposition:   Status is: Inpatient  Dispo: The patient is from: Home  Anticipated d/c is to: Home  Anticipated d/c date is: To be determined.  Patient currently on IV antibiotics for bacteremia, status post TEE with endocarditis of TAVR valve and PPM lead.  Cardiology consultation pending for further evaluation.  ID following.    Meher Kucinski, DO

## 2019-12-03 NOTE — Plan of Care (Signed)
°  Problem: Education: Goal: Knowledge of General Education information will improve Description: Including pain rating scale, medication(s)/side effects and non-pharmacologic comfort measures Outcome: Progressing   Problem: Health Behavior/Discharge Planning: Goal: Ability to manage health-related needs will improve Outcome: Progressing   Problem: Clinical Measurements: Goal: Ability to maintain clinical measurements within normal limits will improve Outcome: Progressing Goal: Respiratory complications will improve Outcome: Progressing Goal: Cardiovascular complication will be avoided Outcome: Progressing   Problem: Activity: Goal: Risk for activity intolerance will decrease Outcome: Progressing   Problem: Nutrition: Goal: Adequate nutrition will be maintained Outcome: Progressing   Problem: Elimination: Goal: Will not experience complications related to bowel motility Outcome: Progressing Goal: Will not experience complications related to urinary retention Outcome: Progressing   Problem: Pain Managment: Goal: General experience of comfort will improve Outcome: Progressing   Problem: Safety: Goal: Ability to remain free from injury will improve Outcome: Progressing   Problem: Skin Integrity: Goal: Risk for impaired skin integrity will decrease Outcome: Progressing

## 2019-12-03 NOTE — Progress Notes (Signed)
PHARMACY CONSULT NOTE FOR:  OUTPATIENT  PARENTERAL ANTIBIOTIC THERAPY (OPAT)  Indication: prosthetic valve enterococcal endocarditis Regimen: ceftriaxone 2g IV q12h and ampicillin 12g every 24 hours as a continuous infusion End date: 01/09/2020  IV antibiotic discharge orders are pended. To discharging provider:  please sign these orders via discharge navigator,  Select New Orders & click on the button choice - Manage This Unsigned Work.     Thank you for allowing pharmacy to be a part of this patient's care.  Phillis Haggis 12/03/2019, 11:48 AM

## 2019-12-04 ENCOUNTER — Inpatient Hospital Stay (HOSPITAL_COMMUNITY): Payer: Medicare Other

## 2019-12-04 LAB — CBC WITH DIFFERENTIAL/PLATELET
Abs Immature Granulocytes: 0.02 10*3/uL (ref 0.00–0.07)
Basophils Absolute: 0 10*3/uL (ref 0.0–0.1)
Basophils Relative: 1 %
Eosinophils Absolute: 0.1 10*3/uL (ref 0.0–0.5)
Eosinophils Relative: 2 %
HCT: 30.7 % — ABNORMAL LOW (ref 39.0–52.0)
Hemoglobin: 9.6 g/dL — ABNORMAL LOW (ref 13.0–17.0)
Immature Granulocytes: 0 %
Lymphocytes Relative: 22 %
Lymphs Abs: 1.2 10*3/uL (ref 0.7–4.0)
MCH: 28.8 pg (ref 26.0–34.0)
MCHC: 31.3 g/dL (ref 30.0–36.0)
MCV: 92.2 fL (ref 80.0–100.0)
Monocytes Absolute: 0.5 10*3/uL (ref 0.1–1.0)
Monocytes Relative: 10 %
Neutro Abs: 3.6 10*3/uL (ref 1.7–7.7)
Neutrophils Relative %: 65 %
Platelets: 148 10*3/uL — ABNORMAL LOW (ref 150–400)
RBC: 3.33 MIL/uL — ABNORMAL LOW (ref 4.22–5.81)
RDW: 15.2 % (ref 11.5–15.5)
WBC: 5.5 10*3/uL (ref 4.0–10.5)
nRBC: 0 % (ref 0.0–0.2)

## 2019-12-04 LAB — URINALYSIS, ROUTINE W REFLEX MICROSCOPIC
Bilirubin Urine: NEGATIVE
Glucose, UA: NEGATIVE mg/dL
Hgb urine dipstick: NEGATIVE
Ketones, ur: NEGATIVE mg/dL
Leukocytes,Ua: NEGATIVE
Nitrite: NEGATIVE
Protein, ur: NEGATIVE mg/dL
Specific Gravity, Urine: 1.02 (ref 1.005–1.030)
pH: 6 (ref 5.0–8.0)

## 2019-12-04 MED ORDER — PANTOPRAZOLE SODIUM 40 MG PO TBEC: 40.0000 mg | DELAYED_RELEASE_TABLET | Freq: Every day | ORAL | 0 refills | Status: DC

## 2019-12-04 MED ORDER — CEFTRIAXONE IV (FOR PTA / DISCHARGE USE ONLY)
2.0000 g | Freq: Two times a day (BID) | INTRAVENOUS | 0 refills | Status: DC
Start: 2019-12-04 — End: 2019-12-07

## 2019-12-04 MED ORDER — AMPICILLIN IV (FOR PTA / DISCHARGE USE ONLY)
12.0000 g | INTRAVENOUS | 0 refills | Status: DC
Start: 2019-12-04 — End: 2019-12-07

## 2019-12-04 MED ORDER — OXYCODONE-ACETAMINOPHEN 5-325 MG PO TABS: 1.0000 | ORAL_TABLET | ORAL | 0 refills | Status: DC | PRN

## 2019-12-04 MED ORDER — OXYCODONE-ACETAMINOPHEN 5-325 MG PO TABS
1.0000 | ORAL_TABLET | ORAL | Status: DC | PRN
  Administered 2019-12-05: 2 via ORAL
  Administered 2019-12-05: 1 via ORAL
  Administered 2019-12-05 – 2019-12-06 (×2): 2 via ORAL
  Filled 2019-12-04 (×2): qty 2
  Filled 2019-12-04: qty 1
  Filled 2019-12-04: qty 2

## 2019-12-04 NOTE — Progress Notes (Signed)
[facility] for Infectious Disease  Date of Admission:  11/27/2019     Total days of antibiotics 8         ASSESSMENT:  Tim [name] blood cultures from 5/28 are finalized without growth indicating clearance of bacteremia. Course has been complicated by prosthetic valve endocarditis and infection of associated pacemaker. Not a surgical candidate as previously noted and will proceed with medical treatment of 6 weeks of IV therapy with ampicillin and ceftriaxone followed by chronic suppression with amoxicillin. We reviewed the plan of care and answered questions regarding infection. New onset back pain he indicates feels like a muscle. Primary team evaluating. OPAT orders have been placed for discharge when ready.   PLAN:  1. Continue current dose of ampicillin and ceftriaxone. 2. OPAT orders placed and [place] orders below. 3. Continue work up of back/flank pain per primary team.  4. Will arrange follow up in ID clinic in 2-3 weeks.  Diagnosis:  Enterococcus bacteremia complicated by prosthetic valve endocarditis and pacemaker infection.   Culture Result: Enterococcus faecalis.   Allergies  Allergen Reactions  . Brimonidine Tartrate Shortness Of Breath    Alphagan-Shortness of breath  . Brinzolamide Shortness Of Breath    AZOPT- Shortness of breath  . Latanoprost Shortness Of Breath    XALATAN- Shortness of breath  . Nucynta [Tapentadol] Shortness Of Breath  . Sulfa Antibiotics Palpitations  . Timolol Maleate Shortness Of Breath and Other (See Comments)    TIMOPTIC- Aggravated asthma  . Diltiazem Swelling     leg swelling  . Rofecoxib Swelling     VIOXX- leg swelling  . Vancomycin Hives and Other (See Comments)    Possible "Red Man Syndrome"? > hives/blisters  . Codeine Other (See Comments)    Childhood reaction  . Tamsulosin Other (See Comments)    Dizziness   . Celecoxib Other (See Comments)    CELLBREX-confusion  . Colchicine Diarrhea    diarrhea  . Tape Rash     OPAT Orders Discharge antibiotics to be given via PICC line Discharge antibiotics: Per pharmacy protocol  Aim for Vancomycin trough 15-20 or AUC 400-550 (unless otherwise indicated) Duration: 6 weeks End Date: 01/09/20   [facility] Care Per Protocol:  Home health RN for IV administration and teaching; PICC line care and labs.    Labs weekly while on IV antibiotics: _X_ CBC with differential _X_ BMP __ CMP __ CRP __ ESR __ Vancomycin trough __ CK  _X_ Please pull PIC at completion of IV antibiotics __ Please leave PIC in place until doctor has seen patient or been notified  Fax weekly labs to [phone]  Clinic Follow Up Appt:  12/22/19 at 10:30 am with Dr. Tommy Medal    Principal Problem:   Gram-positive bacteremia Active Problems:   GOUT   Obesity   Essential hypertension, benign   COPD (chronic obstructive pulmonary disease) ([place])   Pulmonary hypertension ([place])   Hereditary peripheral neuropathy   Aortic stenosis   Paroxysmal atrial fibrillation (HCC)   Chronic pain syndrome   Enterococcal bacteremia history    ICD (implantable cardioverter-defibrillator) in place   S/P TAVR (transcatheter aortic valve replacement)   . allopurinol  300 mg Oral Daily  . aspirin EC  81 mg Oral Daily  . Chlorhexidine Gluconate Cloth  6 each Topical Daily  . enoxaparin (LOVENOX) injection  1 mg/kg Subcutaneous Q12H  . ezetimibe  10 mg Oral Daily  . finasteride  5 mg Oral QHS  . gabapentin  800 mg Oral QID  . mometasone-formoterol  2 puff Inhalation BID  . niacin  1,000 mg Oral BID  . pantoprazole  40 mg Oral Daily  . rosuvastatin  40 mg Oral Daily  . sacubitril-valsartan  0.5 tablet Oral BID  . senna-docusate  1 tablet Oral BID    SUBJECTIVE:  Afebrile overnight. New onset flank pain. No other urinary symptoms at present. Denies fevers, chills, sweats. Has several questions regarding the plan of care. Wife is present at bedside for visit.   Allergies  Allergen  Reactions  . Brimonidine Tartrate Shortness Of Breath    Alphagan-Shortness of breath  . Brinzolamide Shortness Of Breath    AZOPT- Shortness of breath  . Latanoprost Shortness Of Breath    XALATAN- Shortness of breath  . Nucynta [Tapentadol] Shortness Of Breath  . Sulfa Antibiotics Palpitations  . Timolol Maleate Shortness Of Breath and Other (See Comments)    TIMOPTIC- Aggravated asthma  . Diltiazem Swelling     leg swelling  . Rofecoxib Swelling     VIOXX- leg swelling  . Vancomycin Hives and Other (See Comments)    Possible "Red Man Syndrome"? > hives/blisters  . Codeine Other (See Comments)    Childhood reaction  . Tamsulosin Other (See Comments)    Dizziness   . Celecoxib Other (See Comments)    CELLBREX-confusion  . Colchicine Diarrhea    diarrhea  . Tape Rash     Review of Systems: Review of Systems  Constitutional: Negative for chills, fever and weight loss.  Respiratory: Negative for cough, shortness of breath and wheezing.   Cardiovascular: Negative for chest pain and leg swelling.  Gastrointestinal: Negative for abdominal pain, constipation, diarrhea, nausea and vomiting.  Genitourinary: Positive for flank pain. Negative for dysuria, frequency and urgency.  Skin: Negative for rash.      OBJECTIVE: Vitals:   12/03/19 2011 12/03/19 2024 12/03/19 2032 12/04/19 0411  BP: (!) 117/55   (!) 104/55  Pulse: 87  90 87  Resp: _0 Temp: 98.7 F (37.1 C)   98.2 F (36.8 C)  TempSrc: Oral   Oral  SpO2: 98% 98% 98% 98%  Weight:       Body mass index is 43.15 kg/m.  Physical Exam Constitutional:      General: He is not in acute distress.    Appearance: He is well-developed. He is obese.  Cardiovascular:     Rate and Rhythm: Normal rate and regular rhythm.  Pulmonary:     Effort: Pulmonary effort is normal.     Breath sounds: Normal breath sounds.  Skin:    General: Skin is warm and dry.  Neurological:     Mental Status: He is alert and oriented  to person, place, and time.  Psychiatric:        Behavior: Behavior normal.        Thought Content: Thought content normal.        Judgment: Judgment normal.     Lab Results Lab Results  Component Value Date   WBC 5.5 12/04/2019   HGB 9.6 (L) 12/04/2019   HCT 30.7 (L) 12/04/2019   MCV 92.2 12/04/2019   PLT 148 (L) 12/04/2019    Lab Results  Component Value Date   CREATININE 0.65 12/03/2019   BUN 8 12/03/2019   NA 141 12/03/2019   K 4.5 12/03/2019   CL 110 12/03/2019   CO2 24 12/03/2019    Lab Results  Component Value Date  ALT 32 11/28/2019   AST 42 (H) 11/28/2019   ALKPHOS 55 11/28/2019   BILITOT 0.9 11/28/2019     Microbiology: Recent Results (from the past 240 hour(s))  Culture, blood (single)     Status: Abnormal   Collection Time: 11/26/19  2:40 PM   Specimen: Blood  Result Value Ref Range Status   MICRO NUMBER: [id number]  Final   SPECIMEN QUALITY: Adequate  Final   Source BLOOD 1  Final   STATUS: FINAL  Final   ISOLATE 1: Enterococcus faecalis (A)  Final    Comment: Enterococcus faecalis , from aerobic and anaerobic bottles   COMMENT: Aerobic and anaerobic bottle received.  Final      Susceptibility   Enterococcus faecalis - BLOOD CULTURE, POSITIVE 1    AMPICILLIN <=2 Sensitive     VANCOMYCIN 1 Sensitive     GENTAMICIN SYNERGY*  Sensitive      * This organism does not exhibit high levelresistance to gentamicin which indicates thatsynergy of gentamicin with a penicillin is likely.Legend:S = Susceptible  I = IntermediateR = Resistant  NS = Not susceptible* = Not tested  NR = Not reported**NN = See antimicrobic comments  Culture, blood (single)     Status: Abnormal   Collection Time: 11/26/19  2:41 PM   Specimen: Blood  Result Value Ref Range Status   MICRO NUMBER: [id number]  Final   SPECIMEN QUALITY: Adequate  Final   Source BLOOD 2  Final   STATUS: FINAL  Final   ISOLATE 1: Enterococcus faecalis (A)  Final    Comment: Enterococcus faecalis , from  aerobic and anaerobic bottles   COMMENT: Aerobic and anaerobic bottle received.  Final      Susceptibility   Enterococcus faecalis - BLOOD CULTURE, POSITIVE 1    AMPICILLIN <=2 Sensitive     VANCOMYCIN 1 Sensitive     GENTAMICIN SYNERGY*  Sensitive      * This organism does not exhibit high levelresistance to gentamicin which indicates thatsynergy of gentamicin with a penicillin is likely.Legend:S = Susceptible  I = IntermediateR = Resistant  NS = Not susceptible* = Not tested  NR = Not reported**NN = See antimicrobic comments  SARS Coronavirus 2 by RT PCR (hospital order, performed in [place] hospital lab) Nasopharyngeal Nasopharyngeal Swab     Status: None   Collection Time: 11/27/19  5:05 PM   Specimen: Nasopharyngeal Swab  Result Value Ref Range Status   SARS Coronavirus 2 NEGATIVE NEGATIVE Final    Comment: (NOTE) SARS-CoV-2 target nucleic acids are NOT DETECTED. The SARS-CoV-2 RNA is generally detectable in upper and lower respiratory specimens during the acute phase of infection. The lowest concentration of SARS-CoV-2 viral copies this assay can detect is 250 copies / mL. A negative result does not preclude SARS-CoV-2 infection and should not be used as the sole basis for treatment or other patient management decisions.  A negative result may occur with improper specimen collection / handling, submission of specimen other than nasopharyngeal swab, presence of viral mutation(s) within the areas targeted by this assay, and inadequate number of viral copies (<250 copies / mL). A negative result must be combined with clinical observations, patient history, and epidemiological information. Fact Sheet for Patients:   StrictlyIdeas.no Fact Sheet for Healthcare Providers: BankingDealers.co.za This test is not yet approved or cleared  by the [place] FDA and has been authorized for detection and/or diagnosis of SARS-CoV-2 by FDA  under an Emergency Use Authorization (EUA).  This EUA  will remain in effect (meaning this test can be used) for the duration of the COVID-19 declaration under Section 564(b)(1) of the Act, 21 U.S.C. section 360bbb-3(b)(1), unless the authorization is terminated or revoked sooner. Performed at [facility], [place] [name] [place], [place] [address]   Culture, blood (routine x 2)     Status: None   Collection Time: 11/27/19  6:24 PM   Specimen: BLOOD RIGHT HAND  Result Value Ref Range Status   Specimen Description BLOOD RIGHT HAND  Final   Special Requests   Final    BOTTLES DRAWN AEROBIC AND ANAEROBIC Blood Culture results may not be optimal due to an inadequate volume of blood received in culture bottles   Culture   Final    NO GROWTH 5 DAYS Performed at [facility], [place] [address] [place], [place] [address]    Report Status 12/02/2019 FINAL  Final  Culture, blood (routine x 2)     Status: None   Collection Time: 11/28/19  5:46 AM   Specimen: BLOOD  Result Value Ref Range Status   Specimen Description BLOOD LEFT ANTECUBITAL  Final   Special Requests   Final    BOTTLES DRAWN AEROBIC ONLY Blood Culture adequate volume   Culture   Final    NO GROWTH 5 DAYS Performed at [facility], [place] [address] [place], [place] [address]    Report Status 12/03/2019 FINAL  Final  Urine Culture     Status: Abnormal   Collection Time: 11/29/19  3:27 AM   Specimen: Urine, Random  Result Value Ref Range Status   Specimen Description URINE, RANDOM  Final   Special Requests NONE  Final   Culture (A)  Final    <10,000 COLONIES/mL INSIGNIFICANT GROWTH Performed at [facility], [place] [address], [place],  [address]    Report Status 11/30/2019 FINAL  Final     Terri Piedra, NP [place] [facility]  12/04/2019  3:20 PM

## 2019-12-04 NOTE — Plan of Care (Signed)
  Problem: Health Behavior/Discharge Planning: Goal: Ability to manage health-related needs will improve Outcome: Progressing   Problem: Activity: Goal: Risk for activity intolerance will decrease Outcome: Progressing   Problem: Nutrition: Goal: Adequate nutrition will be maintained Outcome: Progressing   Problem: Safety: Goal: Ability to remain free from injury will improve Outcome: Progressing

## 2019-12-04 NOTE — Progress Notes (Signed)
Patient is able to place himself on/off CPAP when ready. RT instructed patient to have RT called if assistance is needed. RT will monitor as needed.

## 2019-12-04 NOTE — Progress Notes (Signed)
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 Subsequently underwent dorsal slit procedure by Dr. Tresa Moore on 4/28 for severe phimosis with buried penis,had a Foley catheter thereafter, Foley catheter was removed about a week agoinurology clinic. He is chronically relatively debilitated and spends most of his time in a recliner, is able to ambulate a little using a walker at home. He reports weakness dizziness and fevers off and on for the past 2 weeks, temperatures as high as 102,he was seen in the infectious disease clinic yesterday with the symptoms and had blood cultures drawn, he was asked to start oral Augmentin of which she took 2 doses,Dr. Tommy Medal was concerned about the possibility of bacteremia and/or endocarditis. His blood cultures today grew gram-positive cocci in chains and hence was asked to come back to the emergency room. He denies any pain swelling or discomfort at his recent amputation site. He was ordered to get repeat blood cultures and given a dose of daptomycin per ID recommendations  Consultants  . Infectious Disease . Cardiothoracic surgery . Cardiology . Electrophysiology  Procedures  . TEE  Antibiotics   Anti-infectives  (From admission, onward)   Start     Dose/Rate Route Frequency Ordered Stop   12/04/19 0000  cefTRIAXone (ROCEPHIN) IVPB     2 g Intravenous Every 12 hours 12/04/19 1005 01/10/20 2359   12/04/19 0000  ampicillin IVPB     12 g Intravenous Every 24 hours 12/04/19 1005 01/10/20 2359   11/28/19 1600  cefTRIAXone (ROCEPHIN) 2 g in sodium chloride 0.9 % 100 mL IVPB     2 g 200 mL/hr over 30 Minutes Intravenous Every 12 hours 11/28/19 1333     11/28/19 1345  ampicillin (OMNIPEN) 2 g in sodium chloride 0.9 % 100 mL IVPB     2 g 300 mL/hr over 20 Minutes Intravenous Every 4 hours 11/28/19 1333     11/27/19 1730  DAPTOmycin (CUBICIN) 1,000 mg in sodium chloride 0.9 % IVPB  Status:  Discontinued     1,000 mg 240 mL/hr over 30 Minutes Intravenous Daily 11/27/19 1655 11/28/19 1333   11/27/19 1700  DAPTOmycin (CUBICIN) 833.5 mg in sodium chloride 0.9 % IVPB  Status:  Discontinued     8 mg/kg  104.2 kg (Adjusted) 233.3 mL/hr over 30 Minutes Intravenous Daily 11/27/19 1657 11/27/19 1658     Subjective  The patient is complaining of right flank pain that began last night. 8/10 in severity.  Objective   Vitals:  Vitals:   12/04/19 0411 12/04/19 1536  BP: (!) 104/55 (!) 125/55  Pulse: 87 86  Resp: 18   Temp: 98.2 F (36.8 C) 98.4 F (36.9 C)  SpO2: 98% 96%    Exam:  Constitutional:  . The patient is awake, alert, and oriented x 3. No acute distress.  Respiratory:  . No increased work of breathing. . No wheezes, rales, or rhonchi . No tactile fremitus Cardiovascular:  . Regular rate and rhythm . No murmurs, ectopy, or gallups. . No lateral PMI. No thrills. Abdomen:  . Abdomen is soft, non-tender, non-distended . No hernias, masses, or organomegaly . Normoactive bowel sounds.  . Positive for right flank pain. Patient jumps with palpation. Musculoskeletal:  . No cyanosis, clubbing, or edema Skin:  . No rashes, lesions, ulcers . palpation of skin: no induration or  nodules Neurologic:  . CN 2-12 intact . Sensation all 4 extremities intact Psychiatric:  . Mental status o Mood, affect appropriate o Orientation to person, place, time  . judgment and insight appear intact  I have personally reviewed the following:   Today's Data  . Vitals, CBC  Micro Data  . Blood Cultures x 2 (11/26/2019): E. Faecalis . Blood Cultures x 2 (11/27/2019): No growth . Urine Culture: Less than 10K, insignificant growth  Cardiology Data  . TEE (12/02/2019): Large mobile vegetation attached to the TAVR valve. No regurgitation or paravalvular leak noted. There is a mobile vegetation attached to the pacer lead in the RV. Normal RV function and ventricular size. EF is 55 - 60%. There is no evidence of a mitral valve vegetation. There is no evidence of a pulmonic vegetation.  Scheduled Meds: . allopurinol  300 mg Oral Daily  . aspirin EC  81 mg Oral Daily  . Chlorhexidine Gluconate Cloth  6 each Topical Daily  . enoxaparin (LOVENOX) injection  1 mg/kg Subcutaneous Q12H  . ezetimibe  10 mg Oral Daily  . finasteride  5 mg Oral QHS  . gabapentin  800 mg Oral QID  . mometasone-formoterol  2 puff Inhalation BID  . niacin  1,000 mg Oral BID  . pantoprazole  40 mg Oral Daily  . rosuvastatin  40 mg Oral Daily  . sacubitril-valsartan  0.5 tablet Oral BID  . senna-docusate  1 tablet Oral BID   Continuous Infusions: . ampicillin (OMNIPEN) IV 2 g (12/04/19 1246)  . 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  Noted over the past 1-2 weeks to be sluggish with low-grade fevers.  Patient seen in ID clinic on 11/26/2019 with repeat blood cultures concerning for gram-positive cocci and subsequently admitted back to the hospital for antibiotic treatment and evaluation for endocarditis.  Patient received a dose of IV daptomycin.  Preliminary cultures from 11/26/2019 + for Enterococcus faecalis.  Repeat blood cultures pending.  Patient with history of TAVR.  Cardiology consulted by ID for evaluation for TEE to rule out endocarditis which was done this morning 12/02/2019 which was consistent to TAVR valve infection and PPM lead infection.  Consult with cardiology for further evaluation and recommendations.  Patient initially on IV daptomycin which has been narrowed down  to IV ampicillin and IV Rocephin per ID recommendations.  ID following and I appreciate the input and recommendations.   Coronary artery disease/ischemic cardiomyopathy EF 30%/status post TAVR and AICD/pulmonary hypertension: Currently euvolemic.  Continue aspirin, Zetia,Crestor, Entresto.  Patient states had taken himself off his beta-blocker due to low blood pressure.  Toprol-XL has been discontinued.  Continue Entresto.  Follow.   Right Flank Pain: Concern for pyelonephritis vs nephrolithiasis. CT abdomen and pelvis, renal ultrasound, urinalysis and urine culture have been ordered.   COPD: Dulera.  Nebs as needed.   Chronic pain/peripheral neuropathy: Neurontin.   Morbid obesity: Complicates all cares. BMI of 43.15.  Hypertension: Blood pressure borderline. Patient asymptomatic.  Toprol-XL discontinued.  Proscar changed to nightly.  Continue Entresto.  Follow.    History of gout: Allopurinol.    Paroxysmal atrial fibrillation: Patient noted to have taken himself off Toprol-XL due to low blood pressure.  Toprol-XL has been discontinued.  Some improvement with blood pressure.  Continue full dose Lovenox for anticoagulation for now until no further procedures are planned and then can subsequently transition back to home dose oral anticoagulation.  Hypokalemia/hypomagnesemia: K. Dur 40 mEq p.o. x1.  Magnesium sulfate 4 g IV x1.   I have seen and examined this patient myself. I have spent 38 minutes in his evaluation and care.  DVT prophylaxis: Lovenox Code Status: DNR Family Communication: Updated patient.  No family at bedside. Disposition:   Status is: Inpatient  Dispo: The patient is from: Home  Anticipated d/c is to: Home  Anticipated d/c date is: Architectural technologist.  Patient currently on IV antibiotics for bacteremia, status post TEE with endocarditis of TAVR valve and PPM lead.  Cardiology consultation pending for further evaluation.  ID following. Pt with new right flank pain. Work up in progress.   Mataeo Ingwersen, DO

## 2019-12-05 ENCOUNTER — Inpatient Hospital Stay (HOSPITAL_COMMUNITY): Payer: Medicare Other

## 2019-12-05 ENCOUNTER — Encounter (HOSPITAL_COMMUNITY): Payer: Medicare Other

## 2019-12-05 LAB — BASIC METABOLIC PANEL
Anion gap: 9 (ref 5–15)
BUN: 6 mg/dL — ABNORMAL LOW (ref 8–23)
CO2: 27 mmol/L (ref 22–32)
Calcium: 8.1 mg/dL — ABNORMAL LOW (ref 8.9–10.3)
Chloride: 105 mmol/L (ref 98–111)
Creatinine, Ser: 0.78 mg/dL (ref 0.61–1.24)
GFR calc Af Amer: >60 mL/min (ref >60)
GFR calc non Af Amer: >60 mL/min (ref >60)
Glucose, Bld: 125 mg/dL — ABNORMAL HIGH (ref 70–99)
Potassium: 3.7 mmol/L (ref 3.5–5.1)
Sodium: 141 mmol/L (ref 135–145)

## 2019-12-05 LAB — CBC WITH DIFFERENTIAL/PLATELET
Abs Immature Granulocytes: 0.04 10*3/uL (ref 0.00–0.07)
Basophils Absolute: 0 10*3/uL (ref 0.0–0.1)
Basophils Relative: 0 %
Eosinophils Absolute: 0.1 10*3/uL (ref 0.0–0.5)
Eosinophils Relative: 2 %
HCT: 30.1 % — ABNORMAL LOW (ref 39.0–52.0)
Hemoglobin: 9.4 g/dL — ABNORMAL LOW (ref 13.0–17.0)
Immature Granulocytes: 1 %
Lymphocytes Relative: 21 %
Lymphs Abs: 1.3 10*3/uL (ref 0.7–4.0)
MCH: 29 pg (ref 26.0–34.0)
MCHC: 31.2 g/dL (ref 30.0–36.0)
MCV: 92.9 fL (ref 80.0–100.0)
Monocytes Absolute: 0.7 10*3/uL (ref 0.1–1.0)
Monocytes Relative: 12 %
Neutro Abs: 3.9 10*3/uL (ref 1.7–7.7)
Neutrophils Relative %: 64 %
Platelets: 134 10*3/uL — ABNORMAL LOW (ref 150–400)
RBC: 3.24 MIL/uL — ABNORMAL LOW (ref 4.22–5.81)
RDW: 15.4 % (ref 11.5–15.5)
WBC: 6 10*3/uL (ref 4.0–10.5)
nRBC: 0 % (ref 0.0–0.2)

## 2019-12-05 NOTE — Progress Notes (Signed)
Patient has a Stimulator that doe snot allow him to have a shoulder MRI. The type of stimulator he has only allows his to have a Brain MRI. Information on the stimulator has been scanned into his chart. Ordering provider made aware.

## 2019-12-05 NOTE — Progress Notes (Signed)
Pt. complaining of pain 8/10 in right arm near PICC line. PRN Percocet given and on call MD informed. Awaiting response from provider. Will continue to monitor pt.

## 2019-12-05 NOTE — Progress Notes (Signed)
Pt complaining of 5/10 pain in right arm near PICC line and weakness to right upper extremity. Pt declines PRN meds at this time. On call MD paged. Awaiting response. Will continue to monitor pt.

## 2019-12-05 NOTE — TOC Progression Note (Signed)
Transition of Care [facility]) - Progression Note    Patient Details  Name: MECHEL SCHUTTER MRN: [id number] Date of Birth: Sep 27, 1940  Transition of Care [facility]) CM/SW Contact  Jacalyn Lefevre Edson Snowball, RN Phone Number: 12/05/2019, 8:38 AM  Clinical Narrative:     Pam with [facility] will hook up IV ABX pump today around 1600. Pam meet with patient and wife last evening.   Cindie with [facility] aware.  Expected Discharge Plan: [place] Barriers to Discharge: Continued Medical Work up  Expected Discharge Plan and Services Expected Discharge Plan: [place]   Discharge Planning Services: CM Consult Post Acute Care Choice: [place] arrangements for the past 2 months: Single Family Home Expected Discharge Date: 12/05/19               DME Arranged: [name]         HH Arranged: RN           Social Determinants of Health (SDOH) Interventions    Readmission Risk Interventions Readmission Risk Prevention Plan 11/28/2019  Transportation Screening Complete  HRI or [place] Complete  Social Work Consult for [place] Planning/Counseling Complete  Palliative Care Screening Not Applicable  Medication Review Press photographer) Referral to Pharmacy  Some recent data might be hidden

## 2019-12-05 NOTE — Progress Notes (Signed)
PROGRESS NOTE  Tim [name] DOB: 03/20/1941 DOA: 11/27/2019 PCP: Tammi Sou, MD  Brief History   Tim [name] a 79 y.o.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 Subsequently underwent dorsal slit procedure by Dr. Tresa Moore on 4/28 for severe phimosis with buried penis,had a Foley catheter thereafter, Foley catheter was removed about a week agoinurology clinic. He is chronically relatively debilitated and spends most of his time in a recliner, is able to ambulate a little using a walker at home. He reports weakness dizziness and fevers off and on for the past 2 weeks, temperatures as high as 102,he was seen in the infectious disease clinic yesterday with the symptoms and had blood cultures drawn, he was asked to start oral Augmentin of which she took 2 doses,Dr. Tommy Medal was concerned about the possibility of bacteremia and/or endocarditis. His blood cultures today grew gram-positive cocci in chains and hence was asked to come back to the emergency room. He denies any pain swelling or discomfort at his recent amputation site. He was ordered to get repeat blood cultures and given a dose of daptomycin per ID recommendations  On the morning od 12/04/2019 the patient complained of right flank pain. He jumped with palpation of the right CVA. Renal ultrasound and CTA renal stone study  was completed. They demonstrated no stones, no hydronephrosis, no stranding to explain the pain. This morning, the patient did not recall this complaint from the day before, but now was complaining of "excruciating" right arm pain. The pain extended from the forearm to the shoulder with only mild-moderate pain at the PICC insertion site. The patient stated that when the PICC was inserted, his right arm was held extended in a position that was uncomfortable, but not painful. Yesterday, he said that the arm was hurting him a little more, although he did not mention it to me yesterday. This morning he complained that the pain was "excruciating". Pt is refusing prn meds. He also complained of weakness in the right upper extremity and weak hand grip. Stat CT head was ordered as well as right upper extremity doppler and x-rays of the right should, elbow and wrist.  Consultants  . Infectious Disease . Cardiothoracic surgery . Cardiology . Electrophysiology  Procedures  . TEE  Antibiotics   Anti-infectives (From admission, onward)   Start     Dose/Rate Route Frequency Ordered Stop   12/04/19 0000  cefTRIAXone (ROCEPHIN) IVPB     2 g Intravenous Every 12 hours 12/04/19 1005 01/10/20 2359   12/04/19 0000  ampicillin IVPB     12 g Intravenous Every 24 hours 12/04/19 1005 01/10/20 2359   11/28/19 1600  cefTRIAXone (ROCEPHIN) 2 g in sodium chloride 0.9 % 100 mL IVPB     2 g 200 mL/hr over 30 Minutes Intravenous Every 12 hours 11/28/19 1333     11/28/19 1345  ampicillin (OMNIPEN) 2 g in sodium chloride 0.9 % 100 mL IVPB  2 g 300 mL/hr over 20 Minutes Intravenous Every 4 hours 11/28/19 1333     11/27/19 1730  DAPTOmycin (CUBICIN) 1,000 mg in sodium chloride 0.9 % IVPB  Status:  Discontinued     1,000 mg 240 mL/hr over 30 Minutes Intravenous Daily 11/27/19 1655 11/28/19 1333   11/27/19 1700  DAPTOmycin (CUBICIN) 833.5 mg in sodium chloride 0.9 % IVPB  Status:  Discontinued     8 mg/kg  104.2 kg  (Adjusted) 233.3 mL/hr over 30 Minutes Intravenous Daily 11/27/19 1657 11/27/19 1658     Subjective  The patient is complaining of right flank pain that began last night. 8/10 in severity.  Objective   Vitals:  Vitals:   12/05/19 0447 12/05/19 1538  BP: (!) 131/54 (!) 127/54  Pulse: 73 80  Resp: 18   Temp: 98.4 F (36.9 C) 98.9 F (37.2 C)  SpO2: 98% 96%    Exam:  Constitutional:  . The patient is awake, alert, and oriented x 3. Mild distress from right arm pain. Respiratory:  . No increased work of breathing. . No wheezes, rales, or rhonchi . No tactile fremitus Cardiovascular:  . Regular rate and rhythm . No murmurs, ectopy, or gallups. . No lateral PMI. No thrills. Abdomen:  . Abdomen is soft, non-tender, non-distended . No hernias, masses, or organomegaly . Normoactive bowel sounds.  . Positive for right flank pain. Patient jumps with palpation. Musculoskeletal:  . No cyanosis, clubbing, or edema Skin:  . No rashes, lesions, ulcers . palpation of skin: no induration or nodules Neurologic:  . CN 2-12 intact . Sensation all 4 extremities intact Psychiatric:  . Mental status o Mood, affect appropriate o Orientation to person, place, time  . judgment and insight appear intact  I have personally reviewed the following:   Today's Data  . Vitals, CBC  Micro Data  . Blood Cultures x 2 (11/26/2019): E. Faecalis . Blood Cultures x 2 (11/27/2019): No growth . Urine Culture: Less than 10K, insignificant growth  Cardiology Data  . TEE (12/02/2019): Large mobile vegetation attached to the TAVR valve. No regurgitation or paravalvular leak noted. There is a mobile vegetation attached to the pacer lead in the RV. Normal RV function and ventricular size. EF is 55 - 60%. There is no evidence of a mitral valve vegetation. There is no evidence of a pulmonic vegetation.  CT Abdomen and Pelvis: (renal stone study) Negative for stone, hydronephrosis or obstruction of the  kidney  Renal ultrasound: Kidneys appear normal. No sign of infection.  Scheduled Meds: . allopurinol  300 mg Oral Daily  . aspirin EC  81 mg Oral Daily  . Chlorhexidine Gluconate Cloth  6 each Topical Daily  . enoxaparin (LOVENOX) injection  1 mg/kg Subcutaneous Q12H  . ezetimibe  10 mg Oral Daily  . finasteride  5 mg Oral QHS  . gabapentin  800 mg Oral QID  . mometasone-formoterol  2 puff Inhalation BID  . niacin  1,000 mg Oral BID  . pantoprazole  40 mg Oral Daily  . rosuvastatin  40 mg Oral Daily  . sacubitril-valsartan  0.5 tablet Oral BID  . senna-docusate  1 tablet Oral BID   Continuous Infusions: . ampicillin (OMNIPEN) IV 2 g (12/05/19 1615)  . 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  Noted over the past 1-2 weeks to be sluggish with low-grade fevers.  Patient seen in ID clinic on 11/26/2019 with repeat blood cultures concerning for gram-positive cocci and subsequently admitted back to the hospital for antibiotic treatment and evaluation for endocarditis.  Patient received a dose of IV daptomycin.  Preliminary cultures from 11/26/2019 + for Enterococcus faecalis.  Repeat blood cultures pending.  Patient with history of TAVR.  Cardiology consulted by ID for evaluation for  TEE to rule out endocarditis which was done this morning 12/02/2019 which was consistent to TAVR valve infection and PPM lead infection.  Consult with cardiology for further evaluation and recommendations.  Patient initially on IV daptomycin which has been narrowed down to IV ampicillin and IV Rocephin per ID recommendations.  ID following and I appreciate the input and recommendations.   Coronary artery disease/ischemic cardiomyopathy EF 30%/status post TAVR and AICD/pulmonary hypertension: Currently euvolemic.  Continue aspirin, Zetia,Crestor, Entresto.  Patient states had taken himself off his beta-blocker due to low blood pressure.  Toprol-XL has been discontinued.  Continue Entresto.  Follow.   Right Flank Pain: Concern for pyelonephritis vs nephrolithiasis. CT abdomen and pelvis, renal ultrasound, urinalysis and urine culture have been ordered. They demonstrate no acute pathology.  Right upper extremity weakness: Pt states that although he had a little pain in the right arm yesterday ( one day after PICC insertion), he states that the pain is excruciating today. He is refusing as needed pain medications. Pt is at high risk for CVA, so stat CT head was ordered. X-ray of the right wrist, elbow, and shoulder have also been ordered in addition to right upper extremity doppler. If patient still has complaint tomorrow, consider MRI of the arm.  COPD: Dulera.  Nebs as needed.   Chronic pain/peripheral neuropathy: Neurontin.   Morbid obesity: Complicates all cares. BMI of 43.15.  Hypertension: Blood pressure borderline. Patient asymptomatic.  Toprol-XL discontinued.  Proscar changed to nightly.  Continue Entresto.  Follow.    History of gout: Allopurinol.   Paroxysmal atrial fibrillation: Patient noted to have taken himself off Toprol-XL due to low blood pressure.  Toprol-XL has been discontinued.  Some improvement with blood pressure.  Continue full dose Lovenox for anticoagulation for now  until no further procedures are planned and then can subsequently transition back to home dose oral anticoagulation.  Hypokalemia/hypomagnesemia: K. Dur 40 mEq p.o. x1.  Magnesium sulfate 4 g IV x1.   I have seen and examined this patient myself. I have spent 43 minutes in his evaluation and care.  DVT prophylaxis: Lovenox Code Status: DNR Family Communication: Updated patient.  No family at bedside. Disposition:   Status is: Inpatient  Dispo: The patient is from: Home  Anticipated d/c is to: Home  Anticipated d/c date is: Architectural technologist.  Patient currently on IV antibiotics for bacteremia, status post TEE with endocarditis of TAVR valve and PPM lead.  Cardiology consultation pending for further evaluation.  ID following. Pt with new right flank pain. Work up in progress.  Ramsey Guadamuz, DO  ADDENDUM: The patient also had multiple complaints about his experience in CT yesterday. He states that he was rushed into the CT scanner, and then ignored when he called otu that he couldn't breathe while in the machine. He also complained that the person who transported him back to his room stated that he should lean on God to relieve his pain and to help him. I have discussed his concerns with Radiology Director. Jessee Avers. I appreciate her help in resolving this issue.

## 2019-12-05 NOTE — Progress Notes (Signed)
Attempted upper extremity venous duplex, however patient is not in room. Will attempt again as schedule permits.  12/05/2019 2:33 PM Kelby Aline., MHA, RVT, RDCS, RDMS

## 2019-12-05 NOTE — Plan of Care (Signed)

## 2019-12-05 NOTE — Plan of Care (Signed)
  Problem: Activity: Goal: Risk for activity intolerance will decrease Outcome: Progressing   Problem: Nutrition: Goal: Adequate nutrition will be maintained Outcome: Progressing   Problem: Coping: Goal: Level of anxiety will decrease Outcome: Progressing   Problem: Safety: Goal: Ability to remain free from injury will improve Outcome: Progressing

## 2019-12-06 ENCOUNTER — Inpatient Hospital Stay (HOSPITAL_COMMUNITY): Payer: Medicare Other

## 2019-12-06 DIAGNOSIS — M7989 Other specified soft tissue disorders: Secondary | ICD-10-CM

## 2019-12-06 DIAGNOSIS — M79609 Pain in unspecified limb: Secondary | ICD-10-CM

## 2019-12-06 LAB — URINE CULTURE: Culture: NO GROWTH

## 2019-12-06 LAB — BASIC METABOLIC PANEL
Anion gap: 7 (ref 5–15)
BUN: 6 mg/dL — ABNORMAL LOW (ref 8–23)
CO2: 27 mmol/L (ref 22–32)
Calcium: 8.1 mg/dL — ABNORMAL LOW (ref 8.9–10.3)
Chloride: 105 mmol/L (ref 98–111)
Creatinine, Ser: 0.78 mg/dL (ref 0.61–1.24)
GFR calc Af Amer: >60 mL/min (ref >60)
GFR calc non Af Amer: >60 mL/min (ref >60)
Glucose, Bld: 129 mg/dL — ABNORMAL HIGH (ref 70–99)
Potassium: 3.7 mmol/L (ref 3.5–5.1)
Sodium: 139 mmol/L (ref 135–145)

## 2019-12-06 LAB — CBC WITH DIFFERENTIAL/PLATELET
Abs Immature Granulocytes: 0.02 10*3/uL (ref 0.00–0.07)
Basophils Absolute: 0 10*3/uL (ref 0.0–0.1)
Basophils Relative: 1 %
Eosinophils Absolute: 0.1 10*3/uL (ref 0.0–0.5)
Eosinophils Relative: 1 %
HCT: 30.5 % — ABNORMAL LOW (ref 39.0–52.0)
Hemoglobin: 9.4 g/dL — ABNORMAL LOW (ref 13.0–17.0)
Immature Granulocytes: 0 %
Lymphocytes Relative: 15 %
Lymphs Abs: 0.9 10*3/uL (ref 0.7–4.0)
MCH: 28.9 pg (ref 26.0–34.0)
MCHC: 30.8 g/dL (ref 30.0–36.0)
MCV: 93.8 fL (ref 80.0–100.0)
Monocytes Absolute: 0.7 10*3/uL (ref 0.1–1.0)
Monocytes Relative: 12 %
Neutro Abs: 4.5 10*3/uL (ref 1.7–7.7)
Neutrophils Relative %: 71 %
Platelets: 139 10*3/uL — ABNORMAL LOW (ref 150–400)
RBC: 3.25 MIL/uL — ABNORMAL LOW (ref 4.22–5.81)
RDW: 15.6 % — ABNORMAL HIGH (ref 11.5–15.5)
WBC: 6.3 10*3/uL (ref 4.0–10.5)
nRBC: 0 % (ref 0.0–0.2)

## 2019-12-06 NOTE — TOC Progression Note (Signed)
Transition of Care [facility]) - Progression Note    Patient Details  Name: ABDULKARIM EBERLIN MRN: [id number] Date of Birth: [birth year]  Transition of [facility]) CM/SW Contact  Claudie Leach, RN 12/06/2019, 1:00 PM  Clinical Narrative:    Patient to d/c home with IV antibiotics. D/W Carolynn Sayers, [facility] infusion, and Cindie with [facility].  Patient needs RN visit within 4 hours of arriving home.  [facility] RN was scheduled for today at 4pm.  However, discharge was not finalized when Alvis Lemmings was making plans for RN this am around 11.  RN is re-scheduled to visit tomorrow at 4.  Patient will need morning dose of Rocephin prior to d/c tomorrow.    Expected Discharge Plan: [place] Barriers to Discharge: Continued Medical Work up  Expected Discharge Plan and Services Expected Discharge Plan: [place]   Discharge Planning Services: CM Consult Post Acute Care Choice: [place] arrangements for the past 2 months: Single Family Home Expected Discharge Date: 12/05/19               DME Arranged: [name]         HH Arranged: RN            Readmission Risk Interventions Readmission Risk Prevention Plan 11/28/2019  Transportation Screening Complete  HRI or Home Care Consult Complete  Social Work Consult for [place] Planning/Counseling Complete  Palliative Care Screening Not Applicable  Medication Review Press photographer) Referral to Pharmacy  Some recent data might be hidden

## 2019-12-06 NOTE — Progress Notes (Signed)
Upper extremity venous has been completed.   Preliminary results in CV Proc.   Abram Sander 12/06/2019 11:41 AM

## 2019-12-06 NOTE — Progress Notes (Signed)
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 Subsequently underwent dorsal slit procedure by Dr. Tresa Moore on 4/28 for severe phimosis with buried penis,had a Foley catheter thereafter, Foley catheter was removed about a week agoinurology clinic. He is chronically relatively debilitated and spends most of his time in a recliner, is able to ambulate a little using a walker at home. He reports weakness dizziness and fevers off and on for the past 2 weeks, temperatures as high as 102,he was seen in the infectious disease clinic yesterday with the symptoms and had blood cultures drawn, he was asked to start oral Augmentin of which she took 2 doses,Dr. Tommy Medal was concerned about the possibility of bacteremia and/or endocarditis. His blood cultures today grew gram-positive cocci in chains and hence was asked to come back to the emergency room. He denies any pain swelling or discomfort at his recent amputation site. He was ordered to get repeat blood cultures and given a dose of daptomycin per ID recommendations  On the morning od 12/04/2019 the patient complained of right flank pain. He jumped with palpation of the right CVA. Renal ultrasound and CTA renal stone study  was completed. They demonstrated no stones, no hydronephrosis, no stranding to explain the pain. This morning, the patient did not recall this complaint from the day before, but now was complaining of "excruciating" right arm pain. The pain extended from the forearm to the shoulder with only mild-moderate pain at the PICC insertion site. The patient stated that when the PICC was inserted, his right arm was held extended in a position that was uncomfortable, but not painful. Yesterday, he said that the arm was hurting him a little more, although he did not mention it to me yesterday. This morning he complained that the pain was "excruciating". Pt is refusing prn meds. He also complained of weakness in the right upper extremity and weak hand grip. Stat CT head was ordered and did not demonstrate acute pathology. Right upper extremity doppler was negative for DVT. X-rays of the right should, elbow and wrist were negative for bony abnormality. There is suggestion of rotator cuff degeneration, but no acute pathology. MRI was considered to further evaluate the right shoulder. However, I was informed by radiology that the patient could not have MRI on his shoulder due to the presence of his spinal stimulator and AICD. The patient feels that this was an error. I have told him that this can be done as outpatient. The patient states that he does not feel that he will pursue it as outpatient.  Consultants  . Infectious Disease . Cardiothoracic surgery . Cardiology . Electrophysiology  Procedures  . TEE  Antibiotics   Anti-infectives (From admission, onward)   Start     Dose/Rate Route Frequency Ordered Stop   12/04/19 0000  cefTRIAXone (  ROCEPHIN) IVPB     2 g Intravenous Every 12 hours 12/04/19 1005 01/10/20 2359   12/04/19 0000  ampicillin IVPB     12 g Intravenous Every 24 hours 12/04/19 1005 01/10/20 2359   11/28/19 1600  cefTRIAXone (ROCEPHIN) 2 g in sodium chloride 0.9 % 100 mL IVPB     2 g 200 mL/hr  over 30 Minutes Intravenous Every 12 hours 11/28/19 1333     11/28/19 1345  ampicillin (OMNIPEN) 2 g in sodium chloride 0.9 % 100 mL IVPB     2 g 300 mL/hr over 20 Minutes Intravenous Every 4 hours 11/28/19 1333     11/27/19 1730  DAPTOmycin (CUBICIN) 1,000 mg in sodium chloride 0.9 % IVPB  Status:  Discontinued     1,000 mg 240 mL/hr over 30 Minutes Intravenous Daily 11/27/19 1655 11/28/19 1333   11/27/19 1700  DAPTOmycin (CUBICIN) 833.5 mg in sodium chloride 0.9 % IVPB  Status:  Discontinued     8 mg/kg  104.2 kg (Adjusted) 233.3 mL/hr over 30 Minutes Intravenous Daily 11/27/19 1657 11/27/19 1658     Subjective  The patient is resting comfortably. No new complaints.  Objective   Vitals:  Vitals:   12/06/19 0756 12/06/19 1523  BP:  (!) 121/59  Pulse: 74 80  Resp: 16 18  Temp:  98.9 F (37.2 C)  SpO2: 98% 97%    Exam:  Constitutional:  The patient is awake, alert, and oriented x 3. No acute distress. Respiratory:  . No increased work of breathing. . No wheezes, rales, or rhonchi . No tactile fremitus Cardiovascular:  . Regular rate and rhythm . No murmurs, ectopy, or gallups. . No lateral PMI. No thrills. Abdomen:  . Abdomen is soft, non-tender, non-distended . No hernias, masses, or organomegaly . Normoactive bowel sounds.  . Positive for right flank pain. Patient jumps with palpation. Musculoskeletal:  . No cyanosis, clubbing, or edema Skin:  . No rashes, lesions, ulcers . palpation of skin: no induration or nodules Neurologic:  . CN 2-12 intact . Sensation all 4 extremities intact Psychiatric:  . Mental status o Mood, affect appropriate o Orientation to person, place, time  . judgment and insight appear intact  I have personally reviewed the following:   Today's Data  . Orthoptist  . Blood Cultures x 2 (11/26/2019): E. Faecalis . Blood Cultures x 2 (11/27/2019): No growth . Urine Culture: Less than 10K, insignificant growth  Cardiology  Data  . TEE (12/02/2019): Large mobile vegetation attached to the TAVR valve. No regurgitation or paravalvular leak noted. There is a mobile vegetation attached to the pacer lead in the RV. Normal RV function and ventricular size. EF is 55 - 60%. There is no evidence of a mitral valve vegetation. There is no evidence of a pulmonic vegetation.  CT Abdomen and Pelvis: (renal stone study) Negative for stone, hydronephrosis or obstruction of the kidney  Renal ultrasound: Kidneys appear normal. No sign of infection.  Doppler of right upper extremity: Negative  X-ray of wrist, elbow and shoulder: No acute pathology. Degeneration of rotator cuff. No bony abnormality.  CT head: No acute pathology  Scheduled Meds: . allopurinol  300 mg Oral Daily  . aspirin EC  81 mg Oral Daily  . Chlorhexidine Gluconate Cloth  6 each Topical Daily  . enoxaparin (LOVENOX) injection  1 mg/kg Subcutaneous Q12H  . ezetimibe  10 mg Oral Daily  . finasteride  5 mg Oral QHS  . gabapentin  800 mg Oral QID  . mometasone-formoterol  2 puff Inhalation BID  . niacin  1,000 mg Oral BID  . pantoprazole  40 mg Oral Daily  . rosuvastatin  40 mg Oral Daily  . sacubitril-valsartan  0.5 tablet Oral BID  . senna-docusate  1 tablet Oral BID   Continuous Infusions: . ampicillin (OMNIPEN) IV 2 g (12/06/19 1709)  . 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  Noted over the past 1-2 weeks to be sluggish with low-grade fevers.  Patient seen in ID clinic on 11/26/2019 with repeat blood cultures concerning for gram-positive cocci and subsequently admitted back to the hospital for antibiotic treatment and evaluation for endocarditis.  Patient received a dose of IV daptomycin.  Preliminary cultures from 11/26/2019 + for Enterococcus faecalis.  Repeat blood cultures pending.  Patient with history of TAVR.  Cardiology consulted by ID for evaluation for TEE to rule out endocarditis which was done this morning 12/02/2019 which was consistent to TAVR valve infection and PPM lead infection.  Consult with cardiology for further evaluation and recommendations.  Patient initially on IV daptomycin which has been narrowed down to IV ampicillin and IV Rocephin per ID recommendations.  ID following and I appreciate the input and recommendations. Arrangements are being made for home antibiotics.  Coronary artery disease/ischemic cardiomyopathy EF 30%/status post TAVR and AICD/pulmonary hypertension: Currently euvolemic.  Continue aspirin, Zetia,Crestor, Entresto.  Patient states had taken himself off his beta-blocker due to low blood pressure.  Toprol-XL has been discontinued.  Continue Entresto.  Follow.   Right Flank Pain: Resolved. CT abdomen and pelvis, renal ultrasound, urinalysis and urine culture have been ordered.They demonstrate no acute pathology.  Right upper extremity weakness: Pt states that although he had a little pain in the right arm yesterday ( one day after PICC insertion), he states that the pain is excruciating today. He is refusing as needed pain medications.Pt states that pain is controlled with pain medication. CT head was negative for acute pathology. Right upper extremity doppler was negative for DVT. X-rays of the  right should, elbow and wrist were negative for bony abnormality. There is suggestion of rotator cuff degeneration, but no acute pathology. MRI was considered to further evaluate the right shoulder. However, I was informed by radiology that the patient could not have MRI on his shoulder due to the presence of his spinal stimulator and AICD. The patient feels that this was an error. I have told him that this can be done as outpatient. The patient states that he does not feel that he will pursue it as outpatient.  COPD: Dulera.  Nebs as needed.   Chronic pain/peripheral neuropathy: Neurontin.   Morbid obesity: Complicates all cares. BMI of 43.15.  Hypertension: Blood pressure borderline. Patient asymptomatic.  Toprol-XL discontinued.  Proscar changed to nightly.  Continue Entresto.  Follow.    History of gout: Allopurinol.   Paroxysmal atrial fibrillation: Patient noted to have taken himself off Toprol-XL due to low blood pressure.  Toprol-XL has been discontinued.  Some improvement with blood pressure.  Continue full dose Lovenox for anticoagulation for now until no further procedures are planned and then can subsequently transition back to home dose oral anticoagulation.  Hypokalemia/hypomagnesemia: K. Dur 40 mEq p.o. x1.  Magnesium sulfate 4 g IV x1.   I have seen and examined this patient myself. I have spent 32 minutes in his evaluation and care.  DVT prophylaxis: Lovenox Code Status: DNR Family Communication: Updated patient.  No family at bedside. Disposition:   Status is: Inpatient  Dispo: The patient is from: Home  Anticipated d/c is to: Home  Anticipated d/c date is: Architectural technologist.  Patient currently on IV antibiotics for bacteremia. PICC has been placed. Barrier to discharge is arrangements for outpatient IV antibiotics.  Gloriana Piltz, DO

## 2019-12-07 MED ORDER — AMPICILLIN IV (FOR PTA / DISCHARGE USE ONLY)
12.0000 g | INTRAVENOUS | 0 refills | Status: DC
Start: 2019-12-07 — End: 2019-12-16

## 2019-12-07 MED ORDER — CEFTRIAXONE IV (FOR PTA / DISCHARGE USE ONLY): 2.0000 g | Freq: Two times a day (BID) | INTRAVENOUS | 0 refills | Status: AC

## 2019-12-07 MED ORDER — HEPARIN SOD (PORK) LOCK FLUSH 100 UNIT/ML IV SOLN
250.0000 [IU] | INTRAVENOUS | Status: AC | PRN
  Administered 2019-12-07: 250 [IU]
  Filled 2019-12-07: qty 2.5

## 2019-12-07 NOTE — Progress Notes (Signed)
Pt and his wife given discharge education with no concerns voiced. Pt going home with picc line for long term antibiotics, [facility] to assist with home infusions. Pt condition stable for discharge

## 2019-12-08 NOTE — Discharge Summary (Signed)
Physician Discharge Summary  Tim [name] ZOX:[id number] DOB: 01/23/1941 DOA: 11/27/2019  PCP: Tammi Sou, MD  Admit date: 11/27/2019 Discharge date: 12/08/2019  Recommendations for Outpatient Follow-up:  1. Discharge to home with home health RN, PT, OT, and long term antibiotics by PICC 2. Follow up with PCP in 7-10 days. Have chemistry checked at that time. 3. Follow up with infectious disease as directed. 4. Follow up with cardiology as directed.  Follow-up Information    [facility] Follow up.        [facility], [facility] [place] Follow up.   Specialty: Home Health Services Contact information: [address] [place] [address] [phone]        Tommy Medal, [name], MD Follow up.   Specialty: Infectious Diseases Why: 12/22/19 at 10:30 am. Please call to reschedule if you are not able to make this appointment.  Contact information: [address] [address] [phone]          Discharge Diagnoses: Principal diagnosis is #1 1. 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 Right flank pain 6. Chronic pain 7. Right arm pain, weakness, numbness, tingling 8. Peripheral neuropathy 9. COPD 10. Morbid obesity 11. Paroxysmal atrial fibrillation 12. Hypokalemia 13. Hypomagnesemia  Discharge Condition: Fair  Disposition: Home with home infusion  Diet recommendation: Heart healthy with modified carbohydrates.  Filed Weights   11/27/19 1600 11/27/19 2242  Weight: (!) 144.2 kg (!) 144.3 kg    History of present illness:   Tim [name] is a 79 y.o. 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 -Subsequently underwent dorsal slit procedure by Dr. Tresa Moore on 4/28 for severe phimosis with buried penis, had a Foley catheter thereafter, Foley catheter was removed about a week ago in urology clinic. He is chronically relatively debilitated and spends most of his time in a recliner, is able to ambulate a little using a walker at home. He reports weakness dizziness and fevers off and on for the past 2 weeks, temperatures as high as 102, he was seen in the infectious disease clinic yesterday with the symptoms and had blood cultures drawn, he was asked to start oral Augmentin of which she took 2 doses, Dr. Drucilla Schmidt was concerned about the possibility of bacteremia and/or endocarditis. His blood cultures today grew gram-positive cocci in chains and hence was asked to come back to the emergency room. He denies any pain swelling or discomfort at his recent amputation site. He was ordered to get repeat blood cultures and given a dose of daptomycin per ID recommendations.  Hospital Course:  [facility] were consulted to admit the patient. Infectious disease was consulted. Infectious disease was consulted.  On the morning od 12/04/2019 the patient complained of right flank pain. He jumped with palpation of the right CVA. Renal ultrasound and CTA renal stone study was completed. They demonstrated no stones, no hydronephrosis, no  stranding to explain the pain. This morning, the patient did not recall this complaint from the day before, but now was complaining of "excruciating" right arm pain. The pain extended from the forearm to the shoulder with only mild-moderate pain at the PICC  insertion site. The patient stated that when the PICC was inserted, his right arm was held extended in a position that was uncomfortable, but not painful. Yesterday, he said that the arm was hurting him a little more, although he did not mention it to me yesterday. This morning he complained that the pain was "excruciating". Pt is refusing prn meds. He also complained of weakness in the right upper extremity and weak hand grip. Stat CT head was ordered and did not demonstrate acute pathology. Right upper extremity doppler was negative for DVT. X-rays of the right should, elbow and wrist were negative for bony abnormality. There is suggestion of rotator cuff degeneration, but no acute pathology. MRI was considered to further evaluate the right shoulder. However, I was informed by radiology that the patient could not have MRI on his shoulder due to the presence of his spinal stimulator and AICD. The patient feels that this was an error. I have told him that this can be done as outpatient. The patient states that he does not feel that he will pursue it as outpatient.  PICC line was placed. The patient is to receive a total of  6 weeks of IV ampicillin and ceftriaxone. Last day of therapy is to be 01/09/2020.  Today's assessment: S: The patient is resting comfortably. No new complaints. O: Vitals:  Vitals:   12/07/19 0323 12/07/19 0811  BP: 127/61   Pulse: 81   Resp: 18   Temp: 98.8 F (37.1 C)   SpO2: 98% 96%   Constitutional:  The patient is awake, alert, and oriented x 3. No acute distress. Respiratory:   No increased work of breathing.  No wheezes, rales, or rhonchi  No tactile fremitus Cardiovascular:   Regular rate and rhythm  No murmurs, ectopy, or gallups.  No lateral PMI. No thrills. Abdomen:   Abdomen is soft, non-tender, non-distended  No hernias, masses, or organomegaly  Normoactive bowel sounds.   Positive for right flank pain. Patient jumps with  palpation. Musculoskeletal:   No cyanosis, clubbing, or edema Skin:   No rashes, lesions, ulcers  palpation of skin: no induration or nodules Neurologic:   CN 2-12 intact  Sensation all 4 extremities intact Psychiatric:   Mental status ? Mood, affect appropriate ? Orientation to person, place, time   judgment and insight appear intact   Discharge Instructions  Discharge Instructions    (HEART FAILURE PATIENTS) Call MD:  Anytime you have any of the following symptoms: 1) 3 pound weight gain in 24 hours or 5 pounds in 1 week 2) shortness of breath, with or without a dry hacking cough 3) swelling in the hands, feet or stomach 4) if you have to sleep on extra pillows at night in order to breathe.   Complete by: As directed    Activity as tolerated - No restrictions   Complete by: As directed    [facility] pharmacist to adjust dose for Vancomycin, Aminoglycosides and other anti-infective therapies as requested by physician.   Complete by: As directed    [facility] to provide Cath Flo 65m   Complete by: As directed    Administer for PICC line occlusion and as ordered by physician for other access device issues.  Anaphylaxis Kit: Provided to treat any anaphylactic reaction to the medication being provided to the patient if First Dose or when requested by physician   Complete by: As directed    Epinephrine 56m/ml vial / amp: Administer 0.331m(0.15m36msubcutaneously once for moderate to severe anaphylaxis, nurse to call physician and pharmacy when reaction occurs and call 911 if needed for immediate care   Diphenhydramine 67m86m IV vial: Administer 25-67mg48mIM PRN for first dose reaction, rash, itching, mild reaction, nurse to call physician and pharmacy when reaction occurs   Sodium Chloride 0.9% NS 500ml 85mAdminister if needed for hypovolemic blood pressure drop or as ordered by physician after call to physician with anaphylactic reaction   Call MD for:   difficulty breathing, headache or visual disturbances   Complete by: As directed    Call MD for:  extreme fatigue   Complete by: As directed    Call MD for:  persistant dizziness or light-headedness   Complete by: As directed    Call MD for:  redness, tenderness, or signs of infection (pain, swelling, redness, odor or green/yellow discharge around incision site)   Complete by: As directed    Call MD for:  severe uncontrolled pain   Complete by: As directed    Change dressing on IV access line weekly and PRN   Complete by: As directed    Diet - low sodium heart healthy   Complete by: As directed    Discharge instructions   Complete by: As directed    Discharge to home with home health RN, PT, OT, and long term antibiotics by PICC Follow up with PCP in 7-10 days. Have chemistry checked at that time. Follow up with infectious disease as directed. Follow up with cardiology as directed.   Flush IV access with Sodium Chloride 0.9% and Heparin 10 units/ml or 100 units/ml   Complete by: As directed    Home infusion instructions - [facility]   Complete by: As directed    Instructions: Flush IV access with Sodium Chloride 0.9% and Heparin 10units/ml or 100units/ml   Change dressing on IV access line: Weekly and PRN   Instructions Cath Flo 2mg: A28mnister for PICC Line occlusion and as ordered by physician for other access device   [facility] pharmacist to adjust dose for: Vancomycin, Aminoglycosides and other anti-infective therapies as requested by physician   Increase activity slowly   Complete by: As directed    Method of administration may be changed at the discretion of home infusion pharmacist based upon assessment of the patient and/or caregiver's ability to self-administer the medication ordered   Complete by: As directed    No wound care   Complete by: As directed    Outpatient Parenteral Antibiotic Therapy Information Antibiotic: Ceftriaxone (Rocephin) IVPB,  Ampicillin; Indications for use: Bacteremia/endocarditis; End Date: 01/09/2020   Complete by: As directed    Antibiotic:  Ceftriaxone (Rocephin) IVPB Ampicillin     Indications for use: Bacteremia/endocarditis   End Date: 01/09/2020     Allergies as of 12/07/2019      Reactions   Brimonidine Tartrate Shortness Of Breath   Alphagan-Shortness of breath   Brinzolamide Shortness Of Breath   AZOPT- Shortness of breath   Latanoprost Shortness Of Breath   XALATAN- Shortness of breath   Nucynta [tapentadol] Shortness Of Breath   Sulfa Antibiotics Palpitations   Timolol Maleate Shortness Of Breath, Other (See Comments)   TIMOPTIC- Aggravated asthma   Diltiazem Swelling  leg swelling   Rofecoxib Swelling    VIOXX- leg swelling   Vancomycin Hives, Other (See Comments)   Possible "Red Man Syndrome"? > hives/blisters   Codeine Other (See Comments)   Childhood reaction   Tamsulosin Other (See Comments)   Dizziness    Celecoxib Other (See Comments)   CELLBREX-confusion   Colchicine Diarrhea   diarrhea   Tape Rash      Medication List    STOP taking these medications   amoxicillin-clavulanate 875-125 MG tablet Commonly known as: Augmentin   isosorbide mononitrate 60 MG 24 hr tablet Commonly known as: IMDUR   metoprolol succinate 25 MG 24 hr tablet Commonly known as: TOPROL-XL   metoprolol succinate 50 MG 24 hr tablet Commonly known as: TOPROL-XL   multivitamin with minerals Tabs tablet   omeprazole 10 MG capsule Commonly known as: PRILOSEC Replaced by: pantoprazole 40 MG tablet     TAKE these medications   albuterol 108 (90 Base) MCG/ACT inhaler Commonly known as: VENTOLIN HFA Inhale 2 puffs into the lungs 4 (four) times daily as needed for wheezing or shortness of breath.   allopurinol 300 MG tablet Commonly known as: ZYLOPRIM TAKE 1 TABLET BY MOUTH ONCE DAILY WITH FOOD What changed: See the new instructions.   ampicillin  IVPB Inject 12 g into the vein daily. As a  continuous infusion. Indication:  Prosthetic valve enterococcal endocarditis  First Dose: No Last Day of Therapy:  01/09/20 Labs - Once weekly:  CBC/D and BMP, Labs - Every other week:  ESR and CRP Method of administration: Ambulatory Pump (Continuous Infusion) Method of administration may be changed at the discretion of home infusion pharmacist based upon assessment of the patient and/or caregiver's ability to self-administer the medication ordered.   Aspirin Low Dose 81 MG EC tablet Generic drug: aspirin Take 81 mg by mouth daily.   B-complex with vitamin C tablet Take 1 tablet by mouth daily. Take 1 tablet daily, 237m   cefTRIAXone  IVPB Commonly known as: ROCEPHIN Inject 2 g into the vein every 12 (twelve) hours. Indication: prosthetiv valve enterococcal endocarditis First Dose: No Last Day of Therapy:  01/09/20 Labs - Once weekly:  CBC/D and BMP, Labs - Every other week:  ESR and CRP Method of administration: IV Push Method of administration may be changed at the discretion of home infusion pharmacist based upon assessment of the patient and/or caregiver's ability to self-administer the medication ordered.   Eliquis 5 MG Tabs tablet Generic drug: apixaban Take 5 mg by mouth 2 (two) times daily.   ezetimibe 10 MG tablet Commonly known as: ZETIA take 1 tablet by mouth once daily   finasteride 5 MG tablet Commonly known as: PROSCAR TAKE 1 TABLET BY MOUTH EVERY DAY   fluticasone 50 MCG/ACT nasal spray Commonly known as: FLONASE Place 2 sprays into both nostrils as needed for allergies or rhinitis.   Fluticasone-Salmeterol 250-50 MCG/DOSE Aepb Commonly known as: ADVAIR Inhale 1 puff into the lungs 2 (two) times daily.   gabapentin 800 MG tablet Commonly known as: NEURONTIN TAKE 1 TABLET (800 MG TOTAL) BY MOUTH 4 (FOUR) TIMES DAILY.   niacin 1000 MG CR tablet Commonly known as: NIASPAN TAKE 1 TABLET BY MOUTH TWICE A DAY   nitroGLYCERIN 0.4 MG SL tablet Commonly known  as: NITROSTAT Place 0.4 mg under the tongue every 5 (five) minutes as needed for chest pain.   oxyCODONE-acetaminophen 5-325 MG tablet Commonly known as: Percocet Take 1-2 tablets by mouth every 4 (four)  hours as needed for moderate pain or severe pain. What changed:   how much to take  additional instructions   pantoprazole 40 MG tablet Commonly known as: PROTONIX Take 1 tablet (40 mg total) by mouth daily. Replaces: omeprazole 10 MG capsule   rosuvastatin 40 MG tablet Commonly known as: CRESTOR take 1 tablet by mouth once daily   sacubitril-valsartan 24-26 MG Commonly known as: ENTRESTO Take 0.5 tablets by mouth 2 (two) times daily.   senna-docusate 8.6-50 MG tablet Commonly known as: Senokot-S Take 1 tablet by mouth 2 (two) times daily. While taking strong pain meds to prevent constipation.   torsemide 20 MG tablet Commonly known as: DEMADEX Take 20 mg by mouth 2 (two) times daily as needed (swelling).   vitamin C 250 MG tablet Commonly known as: ASCORBIC ACID Take 250 mg by mouth daily.   VITAMIN D (CHOLECALCIFEROL) PO Take 1 tablet by mouth daily.            Discharge Care Instructions  (From admission, onward)         Start     Ordered   12/04/19 0000  Change dressing on IV access line weekly and PRN  (Home infusion instructions - [facility] )     12/04/19 1005         Allergies  Allergen Reactions  . Brimonidine Tartrate Shortness Of Breath    Alphagan-Shortness of breath  . Brinzolamide Shortness Of Breath    AZOPT- Shortness of breath  . Latanoprost Shortness Of Breath    XALATAN- Shortness of breath  . Nucynta [Tapentadol] Shortness Of Breath  . Sulfa Antibiotics Palpitations  . Timolol Maleate Shortness Of Breath and Other (See Comments)    TIMOPTIC- Aggravated asthma  . Diltiazem Swelling     leg swelling  . Rofecoxib Swelling     VIOXX- leg swelling  . Vancomycin Hives and Other (See Comments)    Possible "Red Man  Syndrome"? > hives/blisters  . Codeine Other (See Comments)    Childhood reaction  . Tamsulosin Other (See Comments)    Dizziness   . Celecoxib Other (See Comments)    CELLBREX-confusion  . Colchicine Diarrhea    diarrhea  . Tape Rash    The results of significant diagnostics from this hospitalization (including imaging, microbiology, ancillary and laboratory) are listed below for reference.    Significant Diagnostic Studies: DG Shoulder Right  Result Date: 12/05/2019 CLINICAL DATA:  Shoulder pain.  No known injury. EXAM: RIGHT SHOULDER - 2+ VIEW COMPARISON:  None. FINDINGS: Moderate to advanced degenerative changes in the right [facility] and glenohumeral joints with joint space narrowing and spurring. Loss of subacromial space compatible with rotator cuff disease. No acute bony abnormality. Specifically, no fracture, subluxation, or dislocation. Right PICC line is in place with the tip in the SVC. IMPRESSION: Moderate to advanced degenerative changes as above. No acute bony abnormality. Electronically Signed   By: Rolm Baptise M.D.   On: 12/05/2019 15:20   DG ELBOW COMPLETE RIGHT (3+VIEW)  Result Date: 12/05/2019 CLINICAL DATA:  Shoulder pain radiating down arm EXAM: RIGHT ELBOW - COMPLETE 3+ VIEW COMPARISON:  None. FINDINGS: No acute bony abnormality. Specifically, no fracture, subluxation, or dislocation. No joint effusion. Joint spaces maintained. IMPRESSION: Negative. Electronically Signed   By: Rolm Baptise M.D.   On: 12/05/2019 15:19   DG Wrist Complete Right  Result Date: 12/05/2019 CLINICAL DATA:  Right arm pain.  No known injury. EXAM: RIGHT WRIST - COMPLETE 3+ VIEW  COMPARISON:  None. FINDINGS: No acute bony abnormality. Specifically, no fracture, subluxation, or dislocation. Joint spaces maintained. Soft tissues are intact IMPRESSION: No acute bony abnormality. Electronically Signed   By: Rolm Baptise M.D.   On: 12/05/2019 15:19   CT HEAD WO CONTRAST  Result Date: 12/05/2019 CLINICAL  DATA:  Neuro deficit, acute, stroke suspected. Additional history provided: Sudden onset right arm and hand weakness last night, improving since last night. EXAM: CT HEAD WITHOUT CONTRAST TECHNIQUE: Contiguous axial images were obtained from the base of the skull through the vertex without intravenous contrast. COMPARISON:  No pertinent prior studies available for comparison. FINDINGS: Brain: Chronic appearing lacunar infarct versus prominent perivascular space within the inferior left basal ganglia (series 5, image 34). Age-indeterminate lacunar infarct versus prominent sulcus within the inferior right cerebellum (series 7, image 37). There is no acute intracranial hemorrhage. No demarcated cortical infarct. No extra-axial fluid collection. No evidence of intracranial mass. No midline shift. Vascular: No hyperdense vessel.  Atherosclerotic calcifications. Skull: Normal. Negative for fracture or focal lesion. Sinuses/Orbits: Left-sided glaucoma valve. Visualized orbits show no acute finding. Mild ethmoid and maxillary sinus mucosal thickening. No significant mastoid effusion. Other: 2.1 cm rounded focus within the posterior nasopharynx which may reflect secretions, polypoid soft tissue or a hyperdense mucous retention cyst (series 3, image 2). IMPRESSION: Age-indeterminate lacunar infarct versus prominent sulcus within the inferior right cerebellum. Chronic appearing lacunar infarct versus prominent perivascular space within the inferior left basal ganglia. Otherwise unremarkable CT appearance of the brain for age. 2.1 cm rounded focus within the posterior nasopharynx which may reflect secretions, polypoid soft tissue or a hyperdense mucous retention cyst. Electronically Signed   By: Kellie Simmering DO   On: 12/05/2019 14:50   US RENAL  Result Date: 12/04/2019 CLINICAL DATA:  Left flank pain EXAM: RENAL / URINARY TRACT ULTRASOUND COMPLETE COMPARISON:  12/04/2019 FINDINGS: Right Kidney: Renal measurements: 10.5 x 6.2  x 6.6 cm = volume: 225 mL . Echogenicity within normal limits. No mass or hydronephrosis visualized. Left Kidney: Renal measurements: 13.6 x 6.0 x 5.9 cm = volume: 254 mL. Echogenicity within normal limits. No mass or hydronephrosis visualized. Bladder: Appears normal for degree of bladder distention. Other: None. IMPRESSION: No acute findings.  No hydronephrosis. Electronically Signed   By: Rolm Baptise M.D.   On: 12/04/2019 17:02   DG Chest Port 1 View  Result Date: 11/27/2019 CLINICAL DATA:  Fever EXAM: PORTABLE CHEST 1 VIEW COMPARISON:  09/12/2019 FINDINGS: Pacer. Midline trachea. Mild cardiomegaly. Atherosclerosis in the transverse aorta. No pleural effusion or pneumothorax. No congestive failure. Clear lungs. IMPRESSION: Cardiomegaly, without congestive failure or acute disease. Aortic Atherosclerosis (ICD10-I70.0). Electronically Signed   By: Abigail Miyamoto M.D.   On: 11/27/2019 18:29   CT RENAL STONE STUDY  Result Date: 12/04/2019 CLINICAL DATA:  Right flank pain EXAM: CT ABDOMEN AND PELVIS WITHOUT CONTRAST TECHNIQUE: Multidetector CT imaging of the abdomen and pelvis was performed following the standard protocol without IV contrast. COMPARISON:  CT 09/09/2019 FINDINGS: Technical note: Motion degraded exam. Lower chest: No acute abnormality. Hepatobiliary: No focal liver abnormality is seen. Status post cholecystectomy. No biliary dilatation. Pancreas: Unremarkable. No pancreatic ductal dilatation or surrounding inflammatory changes. Spleen: Normal in size without focal abnormality. Adrenals/Urinary Tract: Unremarkable adrenal glands. Kidneys have an unremarkable noncontrast appearance. No renal stone or hydronephrosis. The urinary bladder appears unremarkable. Stomach/Bowel: Stomach is within normal limits. Mild-moderate colonic stool volume. Scattered diverticulosis. No evidence of bowel wall thickening, distention, or inflammatory changes. Vascular/Lymphatic: Aortoiliac  atherosclerotic calcification  without aneurysm. No abdominopelvic lymphadenopathy. Reproductive: Prostate is unremarkable. Other: No free fluid. No organized fluid collection. No free air. Thoracic spinal stimulator is noted. Musculoskeletal: Similar degenerative changes of the lumbar spine. No new or acute osseous findings. IMPRESSION: 1. Motion degraded exam. 2. No acute abdominopelvic findings. Specifically, no renal stone or evidence of obstructive uropathy. 3. Colonic diverticulosis without evidence of acute diverticulitis. 4. Aortic atherosclerosis. (ICD10-I70.0). Electronically Signed   By: Davina Poke D.O.   On: 12/04/2019 18:30   ECHO TEE  Result Date: 12/02/2019    TRANSESOPHOGEAL ECHO REPORT   Patient Name:   IRAM LUNDBERG Date of Exam: 12/02/2019 Medical Rec #:  [id number]       Height:       72.0 in Accession #:    [id number]      Weight:       318.1 lb Date of Birth:  1940/07/25       BSA:          2.595 m Patient Age:    18 years        BP:           120/43 mmHg Patient Gender: M               HR:           81 bpm. Exam Location:  Inpatient Procedure: Transesophageal Echo, 3D Echo, Color Doppler and Cardiac Doppler Indications:     Bacteremia  History:         Patient has prior history of Echocardiogram examinations, most                  recent 09/16/2019. Pacemaker and Defibrillator, Pulmonary HTN                  and COPD, TAVR; Risk Factors:Hypertension, Diabetes,                  Dyslipidemia and Sleep Apnea.                  Aortic Valve: 29 mm Edwards Sapien prosthetic, stented (TAVR)                  valve is present in the aortic position.  Sonographer:     Raquel Sarna Senior RDCS Referring Phys:  [id number] [place] Diagnosing Phys: Eleonore Chiquito MD PROCEDURE: After discussion of the risks and benefits of a TEE, an informed consent was obtained from the patient. TEE procedure time was 25 minutes. The transesophogeal probe was passed without difficulty through the esophogus of the patient. Imaged were obtained with  the patient in a left lateral decubitus position. Local oropharyngeal anesthetic was provided with Cetacaine. Sedation performed by different physician. The patient was monitored while under deep sedation. Anesthestetic sedation was provided intravenously by Anesthesiology: 344m of Propofol. Image quality was excellent. The patient's vital signs; including heart rate, blood pressure, and oxygen saturation; remained stable throughout the procedure. The patient developed no complications during the procedure. IMPRESSIONS  1. There is a large mobile vegetation attached to the TAVR valve. It measures 1.7 cm x 0.5 cm. It appears to be attached to the leaflet of the RCC. 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 There is no regurgitation or paravalvular leak noted. The gradients are stable, although slightly elevated. The aortic valve has been repaired/replaced. Aortic valve regurgitation is not visualized. 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 Aortic valve area, by VTI measures 2.06 cm. Aortic valve mean gradient measures 18.0 mmHg. Aortic valve Vmax measures 2.68 m/s.  2. There is a mobile vegetation attached the pacer lead in the RV. It measures 1.4 cm x 1.2 cm. 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 Right ventricular systolic function is normal. The right ventricular size is normal.  3. Left ventricular ejection fraction, by estimation, is 55 to 60%. The left ventricle has normal function. The left ventricle has no regional wall motion abnormalities.  4. No left atrial/left atrial appendage thrombus was detected. The LAA emptying velocity was 41 cm/s.  5. The mitral valve is degenerative. Mild mitral valve regurgitation. No evidence of mitral stenosis.  6. Evidence of atrial level shunting detected by color flow Doppler. There is a small patent foramen ovale with  predominantly left to right shunting across the atrial septum.  7. There is mild (Grade II) layered plaque involving the ascending aorta and transverse aorta. FINDINGS  Left Ventricle: Left ventricular ejection fraction, by estimation, is 55 to 60%. The left ventricle has normal function. The left ventricle has no regional wall motion abnormalities. The left ventricular internal cavity size was normal in size. There is  no left ventricular hypertrophy. Right Ventricle: There is a mobile vegetation attached the pacer lead in the RV. It measures 1.4 cm x 1.2 cm. 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 The right ventricular size is normal. No increase in right ventricular wall thickness. Right ventricular systolic function is normal. Left Atrium: Left atrial size was normal in size. No left atrial/left atrial appendage thrombus was detected. The LAA emptying velocity was 41 cm/s. Right Atrium: Right atrial size was normal in size. Pericardium: Trivial pericardial effusion is present. Mitral Valve: The mitral valve is degenerative in appearance. Mild mitral valve regurgitation. No evidence of mitral valve stenosis. There is no evidence of mitral valve vegetation. Tricuspid Valve: The tricuspid valve is grossly normal. Tricuspid valve regurgitation is mild . No evidence of tricuspid stenosis. Aortic Valve: There is a large mobile vegetation attached to the TAVR valve. It measures 1.7 cm x 0.5 cm. It appears to be attached to the leaflet of the RCC. 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 There is no regurgitation or paravalvular leak noted. The gradients are stable, although slightly elevated. The aortic valve has been repaired/replaced. Aortic valve regurgitation is not visualized. Aortic valve mean gradient measures 18.0 mmHg. Aortic valve peak gradient measures 28.7 mmHg. Aortic valve area, by VTI measures 2.06 cm. There  is a 29 mm Edwards Sapien prosthetic, stented (TAVR) valve present in the aortic position. Pulmonic Valve: The pulmonic valve was grossly normal. Pulmonic valve regurgitation is not visualized. No evidence of pulmonic stenosis. There is no evidence of pulmonic valve vegetation. Aorta: The aortic root and ascending aorta are structurally normal, with no evidence of dilitation. There is mild (Grade II) layered plaque involving the ascending aorta and transverse aorta. Venous: The left upper pulmonary vein, left lower pulmonary vein, right upper pulmonary vein and right lower pulmonary vein are normal. IAS/Shunts: There is redundancy of the interatrial septum. Evidence of atrial level shunting detected by color flow Doppler. A small patent foramen ovale is detected with predominantly left to right shunting across the atrial septum. Additional Comments: A pacer wire is visualized in the right atrium and right ventricle.  LEFT VENTRICLE PLAX 2D LVOT diam:     2.40 cm LV SV:         120 LV SV Index:   46 LVOT Area:     4.52 cm  AORTIC VALVE AV Area (Vmax):    2.25 cm AV Area (Vmean):   2.21 cm AV Area (VTI):     2.06 cm AV Vmax:           268.00 cm/s AV Vmean:          201.000 cm/s AV VTI:            0.584 m AV Peak Grad:      28.7 mmHg AV Mean Grad:      18.0 mmHg LVOT Vmax:         133.50 cm/s LVOT Vmean:        98.350 cm/s LVOT VTI:          0.266 m LVOT/AV VTI ratio: 0.45  AORTA Ao Asc diam: 3.20 cm TRICUSPID VALVE TR Peak grad:   27.0 mmHg TR Vmax:        260.00 cm/s  SHUNTS Systemic VTI:  0.27 m Systemic Diam: 2.40 cm Eleonore Chiquito MD Electronically signed by Eleonore Chiquito MD Signature Date/Time: 12/02/2019/9:31:33 AM    Final    VAS [name] UPPER EXTREMITY VENOUS DUPLEX  Result Date: 12/07/2019 UPPER VENOUS STUDY  Indications: Swelling, and Pain Comparison Study: no prior Performing Technologist: Abram Sander RVS  Examination Guidelines: A complete evaluation includes B-mode imaging, spectral Doppler, color  Doppler, and power Doppler as needed of all accessible portions of each vessel. Bilateral testing is considered an integral part of a complete examination. Limited examinations for reoccurring indications may be performed as noted.  Right Findings: +----------+------------+---------+-----------+----------+-------+ RIGHT     CompressiblePhasicitySpontaneousPropertiesSummary +----------+------------+---------+-----------+----------+-------+ IJV           Full       Yes       Yes                      +----------+------------+---------+-----------+----------+-------+ Subclavian    Full       Yes       Yes                      +----------+------------+---------+-----------+----------+-------+ Axillary      Full       Yes       Yes                      +----------+------------+---------+-----------+----------+-------+ Brachial      Full       Yes       Yes                      +----------+------------+---------+-----------+----------+-------+ Radial        Full                                          +----------+------------+---------+-----------+----------+-------+ Ulnar         Full                                          +----------+------------+---------+-----------+----------+-------+ Cephalic      Full                                          +----------+------------+---------+-----------+----------+-------+  Basilic       Full                                          +----------+------------+---------+-----------+----------+-------+  Summary:  Right: No evidence of deep vein thrombosis in the upper extremity. No evidence of superficial vein thrombosis in the upper extremity.  *See table(s) above for measurements and observations.  Diagnosing physician: Ruta Hinds MD Electronically signed by Ruta Hinds MD on 12/07/2019 at 7:17:55 PM.    Final    [name] EKG SITE RITE  Result Date: 12/03/2019 If Site Rite image not attached, placement could not be  confirmed due to current cardiac rhythm.   Microbiology: Recent Results (from the past 240 hour(s))  Urine Culture     Status: Abnormal   Collection Time: 11/29/19  3:27 AM   Specimen: Urine, Random  Result Value Ref Range Status   Specimen Description URINE, RANDOM  Final   Special Requests NONE  Final   Culture (A)  Final    <10,000 COLONIES/mL INSIGNIFICANT GROWTH Performed at [facility], [address] [place], [place] [address]    Report Status 11/30/2019 FINAL  Final  Culture, Urine     Status: None   Collection Time: 12/04/19  7:03 PM   Specimen: Urine, Clean Catch  Result Value Ref Range Status   Specimen Description URINE, CLEAN CATCH  Final   Special Requests NONE  Final   Culture   Final    NO GROWTH Performed at [facility], [address], [place], [place] [address]    Report Status 12/06/2019 FINAL  Final     Labs: Basic Metabolic Panel: Recent Labs  Lab 12/02/19 0234 12/03/19 0428 12/05/19 0514 12/06/19 0417  NA 141 141 141 139  K 3.3* 4.5 3.7 3.7  CL 109 110 105 105  CO2 24 24 27 27   GLUCOSE 121* 144* 125* 129*  BUN 6* 8 6* 6*  CREATININE 0.69 0.65 0.78 0.78  CALCIUM 8.4* 8.3* 8.1* 8.1*  MG 1.4* 1.8  --   --    Liver Function Tests: No results for input(s): AST, ALT, ALKPHOS, BILITOT, PROT, ALBUMIN in the last 168 hours. No results for input(s): LIPASE, AMYLASE in the last 168 hours. No results for input(s): AMMONIA in the last 168 hours. CBC: Recent Labs  Lab 12/02/19 0234 12/03/19 0428 12/04/19 1211 12/05/19 0514 12/06/19 0417  WBC 5.8 5.9 5.5 6.0 6.3  NEUTROABS 3.8 3.9 3.6 3.9 4.5  HGB 9.5* 10.1* 9.6* 9.4* 9.4*  HCT 29.4* 31.6* 30.7* 30.1* 30.5*  MCV 91.6 92.1 92.2 92.9 93.8  PLT 162 168 148* 134* 139*   Cardiac Enzymes: No results for input(s): CKTOTAL, CKMB, CKMBINDEX, TROPONINI in the last 168 hours. BNP: BNP (last 3 results) No results for input(s): BNP in the last 8760 hours.  ProBNP (last 3 results) No  results for input(s): PROBNP in the last 8760 hours.  CBG: No results for input(s): GLUCAP in the last 168 hours.  Principal Problem:   Gram-positive bacteremia Active Problems:   GOUT   Obesity   Essential hypertension, benign   COPD (chronic obstructive pulmonary disease) (HCC)   Pulmonary hypertension (HCC)   Hereditary peripheral neuropathy   Aortic stenosis   Paroxysmal atrial fibrillation (HCC)   Chronic pain syndrome   Enterococcal bacteremia history    ICD (implantable cardioverter-defibrillator) in place  S/P TAVR (transcatheter aortic valve replacement)   Time coordinating discharge: 38 minutes.  Signed:        Armando Lauman, DO [facility]  12/08/2019, 5:11 PM

## 2019-12-09 ENCOUNTER — Telehealth: Payer: Self-pay

## 2019-12-09 ENCOUNTER — Other Ambulatory Visit: Payer: Self-pay

## 2019-12-09 ENCOUNTER — Other Ambulatory Visit: Payer: Self-pay | Admitting: Family Medicine

## 2019-12-09 MED ORDER — NIACIN ER (ANTIHYPERLIPIDEMIC) 1000 MG PO TBCR: 1000.0000 mg | EXTENDED_RELEASE_TABLET | Freq: Two times a day (BID) | ORAL | 0 refills | Status: DC

## 2019-12-09 NOTE — Telephone Encounter (Signed)
Sent as FYI  Received voicemail from PT with [facility], patient was supposed to have [facility] PT started but will only have evaluation completed. Patient does not feel like he can complete comfortably right now.

## 2019-12-09 NOTE — Telephone Encounter (Signed)
Attempted to contact patient to complete TCM and schedule hospital f/u. No answer.

## 2019-12-10 ENCOUNTER — Ambulatory Visit: Payer: Medicare Other | Admitting: Infectious Disease

## 2019-12-10 MED ORDER — FINASTERIDE 5 MG PO TABS: 5.0000 mg | ORAL_TABLET | Freq: Every day | ORAL | 0 refills | Status: DC

## 2019-12-10 NOTE — Telephone Encounter (Signed)
Transition Care Management Follow-up Telephone Call  Admission: 11/27/2019-12/08/2019 Primary Diagnosis: E faecalis bacteremia   How have you been since you were released from the hospital? Patient states physically, he feels fine. Upset regarding diagnoses and outcome of admission.    Do you understand why you were in the hospital? yes, "infection in blood, infected artificial valve and leads to defibrillator are infected"   Do you understand the discharge instructions? yes   Where were you discharged to? Home. Resides with wife. Home Health for PICC line IV infusions.    Items Reviewed:  Medications reviewed: yes, pt concerned about medication changes.   Allergies reviewed: yes  Dietary changes reviewed: yes  Referrals reviewed: yes, contacting cardiology tomorrow for hosp f/u appt   Functional Questionnaire:   Activities of Daily Living (ADLs):   He states they are independent in the following: ambulation, bathing and hygiene, feeding, continence, grooming, toileting and dressing. Utilizes walker for ambulation, wife assist/present for ADLs.  States they require assistance with the following: None.    Any transportation issues/concerns?: no   Any patient concerns? yes, pt concerned about medication changes and diagnosis/outcomes.    Confirmed importance and date/time of follow-up visits scheduled yes  Provider Appointment booked with PCP on 12/16/2019.   Confirmed with patient if condition begins to worsen call PCP or go to the ER.  Patient was given the office number and encouraged to call back with question or concerns.  : yes

## 2019-12-10 NOTE — Telephone Encounter (Signed)
noted 

## 2019-12-11 ENCOUNTER — Encounter: Payer: Self-pay | Admitting: Infectious Diseases

## 2019-12-11 NOTE — Telephone Encounter (Signed)
LM requesting call back.

## 2019-12-11 NOTE — Telephone Encounter (Signed)
Pt needs to address these questions to his urologist (finasteride) and his cardiologist (zetia).-thx

## 2019-12-11 NOTE — Telephone Encounter (Signed)
Tried calling patient but line rang busy.

## 2019-12-11 NOTE — Telephone Encounter (Signed)
Client [facility] - Client Client Site [place] - Day Contact Type Call Who Is Calling Patient / Member / Family / Caregiver Caller Name Tim [name] Caller Phone Number [phone] Patient Name Tim [name] Patient DOB 1941/06/19 Call Type Message Only Information Provided Reason for Call Request for General Office Information Initial Comment Caller states he is wanting to leave a message for Maudie Mercury, he would like to correct the information he gave her earlier. Additional Comment Office hours provided. The prescription Proscar, he has lost 2 weeks and is needing another prescription, Insurance will not cover this. So he is needing something that the pharmacy will accept and fill. The prescription Zetia, same situation as Proscar. Caller states he has been speaking to [facility] about the issue and she was in the process of helping him. Office hours provided. Disp. Time Disposition Final User 12/10/2019 5:06:53 PM General Information Provided Yes Peggye Fothergill Call Closed By: Peggye Fothergill Transaction Date/Time: 12/10/2019 5:01:34 PM (ET)

## 2019-12-12 ENCOUNTER — Telehealth: Payer: Self-pay | Admitting: Family Medicine

## 2019-12-12 NOTE — Telephone Encounter (Signed)
Caller Kervin Bones  Call Back # [phone]   Patient returned a call.

## 2019-12-12 NOTE — Telephone Encounter (Signed)
Spoke with patient, advised to contact cardiology regarding Zetia. Patient verbalized understanding.

## 2019-12-12 NOTE — Telephone Encounter (Signed)
Spoke with patient, see other phone note.

## 2019-12-16 ENCOUNTER — Ambulatory Visit (INDEPENDENT_AMBULATORY_CARE_PROVIDER_SITE_OTHER): Payer: Medicare Other | Admitting: Family Medicine

## 2019-12-16 ENCOUNTER — Encounter: Payer: Self-pay | Admitting: Family Medicine

## 2019-12-16 ENCOUNTER — Other Ambulatory Visit: Payer: Self-pay

## 2019-12-16 VITALS — BP 120/74 | HR 87 | Temp 98.2°F | Resp 16 | Ht 72.0 in

## 2019-12-16 DIAGNOSIS — J449 Chronic obstructive pulmonary disease, unspecified: Secondary | ICD-10-CM | POA: Diagnosis not present

## 2019-12-16 DIAGNOSIS — N471 Phimosis: Secondary | ICD-10-CM

## 2019-12-16 DIAGNOSIS — I330 Acute and subacute infective endocarditis: Secondary | ICD-10-CM

## 2019-12-16 DIAGNOSIS — R339 Retention of urine, unspecified: Secondary | ICD-10-CM

## 2019-12-16 DIAGNOSIS — N4883 Acquired buried penis: Secondary | ICD-10-CM

## 2019-12-16 MED ORDER — FLUTICASONE-SALMETEROL 250-50 MCG/DOSE IN AEPB: 1.0000 | INHALATION_SPRAY | Freq: Two times a day (BID) | RESPIRATORY_TRACT | 1 refills | Status: DC

## 2019-12-16 MED ORDER — EZETIMIBE 10 MG PO TABS: 10.0000 mg | ORAL_TABLET | Freq: Every day | ORAL | 1 refills | Status: DC

## 2019-12-16 NOTE — Progress Notes (Signed)
12/16/2019  CC:  Chief Complaint  Patient presents with  . Hospitalization Follow-up    Patient is a 79 y.o. Caucasian male who presents for  hospital follow up, specifically [facility] face-to-face visit. Dates hospitalized: 5/27-6/7, 2021. Days since d/c from hospital: 8 days Patient was discharged from hospital to home, with [facility] services. Reason for admission to hospital: bacteremia/sepsis, worry for endocarditis. Date of interactive (phone) contact with patient and/or caregiver:12/09/19  I have reviewed patient's discharge summary plus pertinent specific notes, labs, and imaging from the hospitalization.   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  VAlves functioning normal, EF 55-60%.  No left atrial appendage thrombus. Abx given IV in hosp, pt sent home on IV rocephin 2g q12h. Says he is doing pretty darn good.  Generally fatigued/weak as per his usual, with some intermittent orthostatic dizziness last couple days but otherwise feeling ok.  No presyncope or falls.  He rarely gets out of his WC. No pain anywhere. Has not had cardiologist f/u yet. Gets ID f/u q 2 wks as long as he's on his home IV abx.  Pt plans on being on oral abx the remainder of his life.  Has some intermittent urinary incontinence as per his usual.  No blood or dysuria.  No fevers.   Appetite poor some days, some days ok.  Drinking fluids well.  He asks for referral to new pulm MD for his COPD.  His is satisfied with his care overall, but particularly prefers one that speaks clear english w/out accent b/c of pt's hearing difficulties--he currently sees [facility] specialist---Dr. Parke Simmers.  He also requests referral to new urologic practice.  Says he is dissatisfied with Dr. Tresa Moore and his staff's communication with him, often confusion, etc.  He has had hx of urinary retention, buried penis, phimosis. He got dorsal slit procedure for  the phimosis and his next f/u with Dr. Tresa Moore was for check of his urinary emptying since getting the procedure done.  Medication reconciliation was done today and patient is taking meds as recommended by discharging hospitalist/specialist.    PMH:  Past Medical History:  Diagnosis Date  . AICD (automatic cardioverter/defibrillator) present   . ASTHMA   . Asthma    as a child  . Balanitis    +severe phimosis+ buried penis->circ not possible but dorsal slit done 10/2019  . BPH (benign prostatic hypertrophy)    with urinary retention  . CAD (coronary artery disease)    Nonobstructive by 12/2012 cath; then 03/2016 he required BMS to RCA ([facility]).  In-stent restenosis on cath 11/01/16, baloon angioplasty successful--pt to take plavix (no ASA), + eliquis (for PAF).  [name] CATARACT, HX OF   . Chronic combined systolic and diastolic CHF (congestive heart failure) ([place]) 05/2016   Ischemic CM.  04/2018 EF 40-45%, grd I DD.  [name] Chronic pain syndrome    Lumbar DDD; chronic neuropathic pain (DM); has spinal stimulator and sees pain mgmt MD  . Complicated UTI (urinary tract infection) 09/2019   Phimoses, acute urinary retention, entoerococcus UTI, enterococc bacteremia.  F/u blood clx's neg x 5d.    [name] COPD   . Diabetes mellitus type 2 with complications (HCC)    ONG2X jumped from 5.7% to 6.1% 03/2015---started metformin at that time.  DM 2 dx by fasting gluc criteria 2018.  Has chronic neuropath pain  . Elevated transaminase level 02/2016   Suspect due to  fatty liver (documented on u/s 2007). Plan repeat labs 03/2016.  [name] Enterococcal bacteremia 09/2019   Phimoses, acute urinary retention, entoerococcus UTI, enterococc bacteremia.  F/u blood clx's neg x 5d.    . Essential hypertension, benign   . Fatty liver 2007   2007 u/s showed fatty liver with hepatosplenomegaly.  2019 repeat u/s->fatty liver but no cirrhosis or hepatosplenomegaly.  . FUO (fever of unknown origin) 11/26/2019  . GERD (gastroesophageal  reflux disease)   . Glaucoma   . GOUT   . Hallux valgus (acquired)   . HH (hiatus hernia)   . HYPERCHOLESTEROLEMIA-PURE   . Hypogonadism male   . Lumbosacral neuritis   . Lumbosacral spondylosis    Lumbar spinal stenosis with neurogenic claudication--contributes to his chronic pain syndrome  . Normal memory function 08/2014   Neuropsychological testing ([facility]): no cognitive impairment or sign of neurodegenerative disorder.  Likely has adjustment d/o with mixed anxiety/depressed features and may benefit from low dose SNRI.    [name] Normocytic anemia 03/2016   Mild-pt needs ferritin and vit B12 level checked (as of 03/22/16). Hb stable 09/2019  . NSTEMI (non-ST elevated myocardial infarction) ([place]) 03/20/2016   BMS to RCA  . Obesity hypoventilation syndrome ([place])   . Obesity, unspecified   . Orthostatic hypotension   . OSA on CPAP    8 cm H2O  . OSTEOARTHRITIS   . Paroxysmal atrial fibrillation ([place]) 2003    (? chronic?) Off anticoag for a while due to falls.  Then apixaban started 12/2014.  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 Ella Bodo): neuropathic pain->diff to treat, failed nucynta, failed spinal stimul trial, oxycontin hs + tramadol + gabap as of 12/2017 f/u Dr. Letta Pate.  . Personal history of colonic adenoma 10/30/2012   Diminutive adenoma, consider repeat 2019 per GI  . PFO (patent foramen ovale) 09/2019   small, with predominately L to R shunt  . Pneumonia   . Presence of cardiac defibrillator 11/07/2017  . Primary osteoarthritis of both knees    Bone on bone of medial compartments, + signif patellofemoral arth bilat.--supartz inj series started 09/12/17  . PUD (peptic ulcer disease)   . PULMONARY HYPERTENSION, HX OF   . Secondary male hypogonadism 2017  . 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 Shortness of breath    with exertion: much improved s/p  TAVR and treatment for CHF.  . Sick sinus syndrome (HCC)    PPM placed  . Thrombocytopenia ([place]) 2018   HSM on 2007 abd u/s---suspect some mild splenic sequestration chronically.  [name] Unspecified glaucoma(365.9)   . Unspecified hereditary and idiopathic peripheral neuropathy approx age 42   bilat LE's, ? left arm, too.  Feet became progressively numb + left foot pain intermittently.  Pt may be trying a spinal stimulator (as of 05/2015)  . VENOUS INSUFFICIENCY    Being followed by Dr. Sharol Given as of 10/2016 for two R LL venous stasis ulcers/skin tears.  Healed as of 10/30/16 f/u with Dr. Sharol Given.  . VENTRAL HERNIA   . Wound dehiscence 10/22/2019    PSH:  Past Surgical History:  Procedure Laterality Date  . AMPUTATION Left 04/11/2013   Procedure: AMPUTATION DIGIT Left 3rd toe;  Surgeon: [name], MD;  Location: [place];  Service: Orthopedics;  Laterality: Left;  Left 3rd toe amputation at [facility]  . AMPUTATION Left 08/29/2019   Procedure: LEFT  TRANSMETATARSAL AMPUTATION;  Surgeon: [name], MD;  Location: [place];  Service: Orthopedics;  Laterality: Left;  . CARDIAC CATHETERIZATION  1997; 03/10/16   1997 Non-obstructive disease.  03/2016 BMS to RCA, with 25% pDiag dz, o/w normal cors per cath 03/07/16.  Cath 11/01/16: in stent restenosis, successful baloon angioplasty.  [name] CARDIAC CATHETERIZATION  12/24/2012   mild < 20% LCx, prox 30% RCA; LVEF 55-65% , moderate pulmonary HTN, moderate AS  . CARDIAC DEFIBRILLATOR PLACEMENT  11/07/2017   [facility] MRI Quad CRT defibrillator  . CARDIOVASCULAR STRESS TEST  05/11/16 ([facility])   Myocardial perfusion imaging:  No ischemia; scar in apex, global hypokinesis, EF 36%.  . Carotid dopplers  03/09/2016   [facility]: no hemodynamically significant stenosis on either side.  . CHOLECYSTECTOMY    . COLONOSCOPY    . COLONOSCOPY [name] 10/30/2012   Procedure: COLONOSCOPY;  Surgeon: [name], MD;  Location: [facility];  Service: Endoscopy;  Laterality: [name];  . CORONARY  ANGIOPLASTY WITH STENT PLACEMENT  03/2016; 04/2017   2017-[facility]: BMS to RCA-pt was placed on Brilinta.  04/2017: DES to RCA.  . DORSAL SLIT [name] 10/29/2019   for severe phimosis. Procedure: DORSAL SLIT;  Surgeon: Alexis Frock, MD;  Location: [facility];  Service: Urology;  Laterality: [name];  68 MINS  . EYE SURGERY Bilateral cataract  . HEMORRHOID SURGERY    . INTRAOCULAR LENS INSERTION Bilateral   . KNEE SURGERY Right   . LEFT AND RIGHT HEART CATHETERIZATION WITH CORONARY ANGIOGRAM [name] 12/24/2012   Procedure: LEFT AND RIGHT HEART CATHETERIZATION WITH CORONARY ANGIOGRAM;  Surgeon: [name], MD;  Location: [facility] CATH [facility];  Service: Cardiovascular;  Laterality: [name];  . LEG SURGERY Bilateral    lenghtening   . PACEMAKER PLACEMENT  04/13/2016   2nd deg HB after TAVR, pt had DC MDT PPM placed.  [name] SHOULDER ARTHROSCOPY  08/30/2011   Procedure: ARTHROSCOPY SHOULDER;  Surgeon: [name], MD;  Location: [place];  Service: Orthopedics;  Laterality: Right;  Right Shoulder Arthroscopy, Debridement, and Decompression  . SPINAL CORD STIMULATOR INSERTION [name] 09/10/2015   Procedure: LUMBAR SPINAL CORD STIMULATOR INSERTION;  Surgeon: [name], MD;  Location: [place] [facility];  Service: Neurosurgery;  Laterality: [name];  . TEE WITHOUT CARDIOVERSION [name] 09/16/2019   Procedure: TRANSESOPHAGEAL ECHOCARDIOGRAM (TEE);  Surgeon: Skeet Latch, MD;  Location: [place];  Service: Cardiovascular;  Laterality: [name];  . TEE WITHOUT CARDIOVERSION [name] 12/02/2019   Procedure: TRANSESOPHAGEAL ECHOCARDIOGRAM (TEE);  Surgeon: Geralynn Rile, MD;  Location: [place];  Service: Cardiovascular;  Laterality: [name];  . 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  R big toe surg due to osteoarth  . TONSILLECTOMY    . traeculectomy Left    eye  . TRANSCATHETER AORTIC VALVE REPLACEMENT, TRANSFEMORAL  04/11/2016  . TRANSESOPHAGEAL ECHOCARDIOGRAM  03/09/2016; 09/2019   [facility]: EF 55-60%, PFO seen with bi-directional  shunting, no thrombus in appendage.  09/2019 ->no valvular vegetations. Small patent foramen ovale with predominantly left to right shunting across the interatrial septum.  . TRANSTHORACIC ECHOCARDIOGRAM  01/2015; 01/2016; 05/18/16; 09/18/16, 05/2017, 08/2017   01/2015 No signif change in aortic stenosis (moderate).  01/2016 Severe LVH w/small LV cavity, EF 60-65%, grade I diast dysfxn.  05/2016 (s/p TAVR): EF 50-55%, grd I DD, biopros AV good.  08/2016--EF 50-55%, LV septal motion c/w conduction abnl, grd I DD,mild MS,bioprosth aortic valve well seated, w/trace AR. 05/2017 TTE EF 35%. 08/2017-EF 35%, mod diff hypokin LV, grd I DD,  biopros AV good.   . TRANSTHORACIC ECHOCARDIOGRAM  04/2018; 09/2019   04/2018: EF 40-45%, mod diffuse LV hypokinesis, grd I DD, bioprosth AV well seated, no AS or AR. 09/2019 EF 60-65%, grd I DD, valves fine, including bioprosth AV  . VITRECTOMY      MEDS:  Outpatient Medications Prior to Visit  Medication Sig Dispense Refill  . albuterol (PROVENTIL HFA;VENTOLIN HFA) 108 (90 BASE) MCG/ACT inhaler Inhale 2 puffs into the lungs 4 (four) times daily as needed for wheezing or shortness of breath.    . allopurinol (ZYLOPRIM) 300 MG tablet TAKE 1 TABLET BY MOUTH ONCE DAILY WITH FOOD 90 tablet 1  . apixaban (ELIQUIS) 5 MG TABS tablet Take 5 mg by mouth 2 (two) times daily.    . ASPIRIN LOW DOSE 81 MG EC tablet Take 81 mg by mouth daily.    . B Complex-C (B-COMPLEX WITH VITAMIN C) tablet Take 1 tablet by mouth daily. Take 1 tablet daily, 227m    . cefTRIAXone (ROCEPHIN) IVPB Inject 2 g into the vein every 12 (twelve) hours. Indication: prosthetiv valve enterococcal endocarditis First Dose: No Last Day of Therapy:  01/09/20 Labs - Once weekly:  CBC/D and BMP, Labs - Every other week:  ESR and CRP Method of administration: IV Push Method of administration may be changed at the discretion of home infusion pharmacist based upon assessment of the patient and/or caregiver's ability to  self-administer the medication ordered. 74 Units 0  . finasteride (PROSCAR) 5 MG tablet Take 1 tablet (5 mg total) by mouth daily. 90 tablet 0  . fluticasone (FLONASE) 50 MCG/ACT nasal spray Place 2 sprays into both nostrils as needed for allergies or rhinitis.    .Marland Kitchengabapentin (NEURONTIN) 800 MG tablet TAKE 1 TABLET (800 MG TOTAL) BY MOUTH 4 (FOUR) TIMES DAILY. 360 tablet 2  . isosorbide mononitrate (IMDUR) 30 MG 24 hr tablet Take 30 mg by mouth daily.    . niacin (NIASPAN) 1000 MG CR tablet Take 1 tablet (1,000 mg total) by mouth 2 (two) times daily. 180 tablet 0  . rosuvastatin (CRESTOR) 40 MG tablet take 1 tablet by mouth once daily (Patient taking differently: Take 40 mg by mouth daily. ) 15 tablet 0  . sacubitril-valsartan (ENTRESTO) 24-26 MG Take 0.5 tablets by mouth 2 (two) times daily.     . vitamin C (ASCORBIC ACID) 250 MG tablet Take 250 mg by mouth daily.    .Marland KitchenVITAMIN D, CHOLECALCIFEROL, PO Take 1 tablet by mouth daily.     .Marland Kitchenezetimibe (ZETIA) 10 MG tablet take 1 tablet by mouth once daily 90 tablet 3  . Fluticasone-Salmeterol (ADVAIR) 250-50 MCG/DOSE AEPB Inhale 1 puff into the lungs 2 (two) times daily.    . nitroGLYCERIN (NITROSTAT) 0.4 MG SL tablet Place 0.4 mg under the tongue every 5 (five) minutes as needed for chest pain.  (Patient not taking: Reported on 12/16/2019)    . pantoprazole (PROTONIX) 40 MG tablet Take 1 tablet (40 mg total) by mouth daily. (Patient not taking: Reported on 12/16/2019) 30 tablet 0  . senna-docusate (SENOKOT-S) 8.6-50 MG tablet Take 1 tablet by mouth 2 (two) times daily. While taking strong pain meds to prevent constipation. (Patient not taking: Reported on 12/16/2019) 10 tablet 0  . torsemide (DEMADEX) 20 MG tablet Take 20 mg by mouth 2 (two) times daily as needed (swelling).  (Patient not taking: Reported on 12/16/2019)    . ampicillin IVPB Inject 12 g into the vein daily. As a  continuous infusion. Indication:  Prosthetic valve enterococcal endocarditis   First Dose: No Last Day of Therapy:  01/09/20 Labs - Once weekly:  CBC/D and BMP, Labs - Every other week:  ESR and CRP Method of administration: Ambulatory Pump (Continuous Infusion) Method of administration may be changed at the discretion of home infusion pharmacist based upon assessment of the patient and/or caregiver's ability to self-administer the medication ordered. (Patient not taking: Reported on 12/16/2019) 37 Units 0  . cefTRIAXone (ROCEPHIN) 10 g injection  (Patient not taking: Reported on 12/16/2019)    . oxyCODONE-acetaminophen (PERCOCET) 5-325 MG tablet Take 1-2 tablets by mouth every 4 (four) hours as needed for moderate pain or severe pain. (Patient not taking: Reported on 12/16/2019) 20 tablet 0   No facility-administered medications prior to visit.   EXAM:  Vitals with BMI 12/16/2019 12/07/2019 12/06/2019  Height 6' 0" - -  Weight (No Data) - -  BMI - - -  Systolic [phone]  Diastolic 74 61 78  Pulse 87 81 87   Gen: alert, sitting up in WC and pleasant.  Lucid thought and speech. CV: RRR, no m/r/g.   LUNGS: CTA bilat, nonlabored resps, good aeration in all lung fields. R PICC line in place appears fine. Bilat LL hyperpigmentation changes with fibrotic skin changes but no erythema and minimal-to-no pitting edema.  No warmth.  Pertinent labs/imaging Lab Results  Component Value Date   TSH 0.96 09/16/2018   Lab Results  Component Value Date   WBC 6.3 12/06/2019   HGB 9.4 (L) 12/06/2019   HCT 30.5 (L) 12/06/2019   MCV 93.8 12/06/2019   PLT 139 (L) 12/06/2019   Lab Results  Component Value Date   CREATININE 0.78 12/06/2019   BUN 6 (L) 12/06/2019   NA 139 12/06/2019   K 3.7 12/06/2019   CL 105 12/06/2019   CO2 27 12/06/2019   Lab Results  Component Value Date   ALT 32 11/28/2019   AST 42 (H) 11/28/2019   ALKPHOS 55 11/28/2019   BILITOT 0.9 11/28/2019   Lab Results  Component Value Date   CHOL 107 12/25/2018   Lab Results  Component Value Date    HDL 62.70 12/25/2018   Lab Results  Component Value Date   LDLCALC 29 12/25/2018   Lab Results  Component Value Date   TRIG 72.0 12/25/2018   Lab Results  Component Value Date   CHOLHDL 2 12/25/2018   Lab Results  Component Value Date   PSA 0.23 11/23/2015   PSA 0.33 10/07/2014   PSA 0.40 09/25/2013   Lab Results  Component Value Date   HGBA1C 5.6 10/02/2019    ASSESSMENT/PLAN:  TCM hosp f/u: improving gradually/appropriately.  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 I cannot find any blood culture results/tests in EMR but there is comment of gram positive bacteremia (gram pos cocci in chains on blood clx's done in Dr. Lucianne Lei Dam's office the day prior to admission). Improving on home IV abx. He got evaluated for other [facility] services but declined all. He has approp f/u with ID MD, Dr. Tommy Medal.  2) Orthostatic hypotension/dizziness: bp's have been good. Sx's actually less problematic than his baseline. No change in meds.   3) Chronic fatigue: multifactorial-->debilitated pt, deconditioning, morbid obesity, polypharmacy, multiple morbidities.  This is stable.  4) CAD with ischemic CM:  interestingly the TEE in hosp showed his EF to now be back to normal (55-60%) and no regional  wall motion was noted.  He'll continue entresto, ASA, statin, zetia, imdur.  Also has torsemide to use prn but patient says he has not had to use any in weeks.  He has been taken off of his toprol xl due to hypotension in hosp, plus his imdur dose was cut in half. He remains on eliquis for PAF.   5) COPD: refer to new pulm MD (see HPI for details)--ordered.  6) Buried penis, hx of severe phimosis and resultant urinary retention/recurrent UTI-->pt got dorsal slit procedure and foley has been d/c'd by his urologist's office (Dr. Tresa Moore with [facility]). However, pt not satisfied with the communication he is getting through Dr. Tresa Moore and his office so he requests referral to a  new urologist office today--ordered. Continue finasteride.  Not on tamsulosin due to side effect of dizziness.  Medical decision making of moderate complexity was utilized today.  FOLLOW UP:  2 wks->[facility]/recent hosp for endocarditis.  Signed:  Crissie Sickles, MD           12/16/2019

## 2019-12-18 ENCOUNTER — Other Ambulatory Visit: Payer: Self-pay

## 2019-12-18 ENCOUNTER — Encounter: Payer: Self-pay | Admitting: Orthopedic Surgery

## 2019-12-18 ENCOUNTER — Ambulatory Visit (INDEPENDENT_AMBULATORY_CARE_PROVIDER_SITE_OTHER): Payer: Medicare Other | Admitting: Physician Assistant

## 2019-12-18 VITALS — Ht 72.0 in | Wt 318.1 lb

## 2019-12-18 DIAGNOSIS — L97929 Non-pressure chronic ulcer of unspecified part of left lower leg with unspecified severity: Secondary | ICD-10-CM

## 2019-12-18 DIAGNOSIS — I87332 Chronic venous hypertension (idiopathic) with ulcer and inflammation of left lower extremity: Secondary | ICD-10-CM

## 2019-12-18 NOTE — Progress Notes (Signed)
Office Visit Note   Patient: Tim [name]           Date of Birth: 1941-04-11           MRN: [id number] Visit Date: 12/18/2019              Requested by: Tammi Sou, MD [address] [place],  [place] [address] PCP: Tammi Sou, MD  Chief Complaint  [name]    08/29/19 Left TMA        HPI: Patient presents today almost 4 months status post left transmetatarsal amputation.  He is very pleased with the healing on his foot.  He has had significant other health problems and is currently on IV antibiotics for an infection that involves his defibrillator.  He was recently in the hospital was not wearing his CircAid wraps and has had some increased swelling in his legs  Assessment & Plan: Visit Diagnoses: No diagnosis found.  Plan: Patient will go back into his CircAid wraps.  I have given him a prescription for diabetic shoes which are medically necessary given his neuropathy and his recent surgery.  He will follow-up in about a month  Follow-Up Instructions: No follow-ups on file.   Ortho Exam  [name], oriented, no adenopathy, well-dressed, normal affect, normal respiratory effort. Lower legs bilateral lower legs venous stasis disease with discoloration but no cellulitis or open ulcers.  Compartments are soft and compressible left transmetatarsal amputation site is healed with just one small eschar at the tip.  There is no foul odor there is no drainage no cellulitis  Imaging: No results found. No images are attached to the encounter.  Labs: Lab Results  Component Value Date   HGBA1C 5.6 10/02/2019   HGBA1C 6.1 03/27/2019   HGBA1C 6.1 09/16/2018   ESRSEDRATE 20 02/09/2016   ESRSEDRATE 36 (H) 01/28/2014   CRP 2.5 (H) 11/26/2019   LABURIC 3.5 (L) 09/10/2013   REPTSTATUS 12/06/2019 FINAL 12/04/2019   CULT  12/04/2019    NO GROWTH Performed at [facility], [place] [address], [place], [place]  [address]    LABORGA ENTEROCOCCUS FAECALIS 09/11/2019     Lab Results  Component Value Date   ALBUMIN 2.5 (L) 11/28/2019   ALBUMIN 2.9 (L) 11/27/2019   ALBUMIN 2.9 (L) 11/26/2019   LABURIC 3.5 (L) 09/10/2013    Lab Results  Component Value Date   MG 1.8 12/03/2019   MG 1.4 (L) 12/02/2019   MG 1.8 09/17/2019   No results found for: VD25OH  No results found for: PREALBUMIN CBC EXTENDED Latest Ref Rng & Units 12/06/2019 12/05/2019 12/04/2019  WBC 4.0 - 10.5 K/uL 6.3 6.0 5.5  RBC 4.22 - 5.81 MIL/uL 3.25(L) 3.24(L) 3.33(L)  HGB 13.0 - 17.0 g/dL 9.4(L) 9.4(L) 9.6(L)  HCT 39 - 52 % 30.5(L) 30.1(L) 30.7(L)  PLT 150 - 400 K/uL 139(L) 134(L) 148(L)  NEUTROABS 1.7 - 7.7 K/uL 4.5 3.9 3.6  LYMPHSABS 0.7 - 4.0 K/uL 0.9 1.3 1.2     Body mass index is 43.14 kg/m.  Orders:  No orders of the defined types were placed in this encounter.  No orders of the defined types were placed in this encounter.    Procedures: No procedures performed  Clinical Data: No additional findings.  ROS:  All other systems negative, except as noted in the HPI. Review of Systems  Objective: Vital Signs: Ht 6' (1.829 m)  Wt (!) 318 lb 1.9 oz (144.3 kg)   BMI 43.14 kg/m   Specialty Comments:  No specialty comments available.  PMFS History: Patient Active Problem List   Diagnosis Date Noted  . Gram-positive bacteremia 11/27/2019  . FUO (fever of unknown origin) 11/26/2019  . Low blood pressure reading 11/05/2019  . Wound dehiscence 10/22/2019  . PFO (patent foramen ovale) 10/06/2019  . ICD (implantable cardioverter-defibrillator) battery depletion   . Pressure injury of skin 09/13/2019  . Enterococcal bacteremia history  09/13/2019  . 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  . 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 Urinary retention 09/13/2019  . Phimosis 09/13/2019  . SIRS (systemic inflammatory response syndrome) ([place]) 09/12/2019  . Sepsis  ([place]) 09/12/2019  . Enterococcus UTI 09/12/2019  . Cellulitis of foot   . History of transmetatarsal amputation of left foot ([place])   . Subacute osteomyelitis, left ankle and foot ([place])   . 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 Diabetic neuropathy, painful ([place]) 12/26/2016  . Neuropathic pain of foot, left 06/20/2016  . 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 Status post amputation of left great toe ([place]) 06/08/2015  . Spinal stenosis of lumbar region 02/16/2015  . Hereditary and idiopathic peripheral neuropathy 01/12/2015  . Oral thrush 12/28/2014  . Orthostatic dizziness 05/24/2014  . Paresthesias with subjective weakness 01/30/2014  . Bilateral thigh pain 01/30/2014  . Abdominal wall pain in right upper quadrant 01/22/2014  . Gross hematuria 09/25/2013  . Intertrigo 09/25/2013  . IBS (irritable bowel syndrome) 09/11/2013  . Chronic pain syndrome 08/06/2013  . CAD (coronary artery disease), native coronary artery 12/25/2012  . Paroxysmal atrial fibrillation ([place]) 12/25/2012  . Moderate aortic stenosis 12/25/2012  . Aortic stenosis 12/24/2012  . Personal history of colonic adenoma 10/30/2012  . Diverticulosis of colon (without mention of hemorrhage) 10/30/2012  . Internal hemorrhoids 10/30/2012  . Change in bowel habits intermittent loose stools 10/30/2012  . Routine health maintenance 06/09/2012  . Hereditary peripheral neuropathy 10/10/2011  . Long term current use of anticoagulant - apixiban 08/16/2010  . Dyspnea on exertion 09/08/2009  . HYPERCHOLESTEROLEMIA-PURE 01/13/2009  . Essential hypertension, benign 01/13/2009  . EDEMA 01/13/2009  . GOUT 01/12/2009  . GLAUCOMA 01/12/2009  . Venous insufficiency of both lower extremities 01/12/2009  . COPD (chronic obstructive pulmonary disease) ([place]) 01/12/2009  . VENTRAL HERNIA 01/12/2009  . OSTEOARTHRITIS 01/12/2009  . ARTHRITIS 01/12/2009  . DEGENERATIVE DISC  DISEASE 01/12/2009  . CATARACT, HX OF 01/12/2009  . HEART MURMUR, HX OF 01/12/2009  . BENIGN PROSTATIC HYPERTROPHY, HX OF 01/12/2009  . Obesity 01/17/2007  . OBSTRUCTIVE SLEEP APNEA 01/17/2007  . ASTHMA 01/17/2007  . GERD 01/17/2007  . HALLUX VALGUS, ACQUIRED 01/17/2007  . Pulmonary hypertension ([place]) 01/17/2007   Past Medical History:  Diagnosis Date  . AICD (automatic cardioverter/defibrillator) present   . ASTHMA   . Asthma    as a child  . Balanitis    +severe phimosis+ buried penis->circ not possible but dorsal slit done 10/2019  . BPH (benign prostatic hypertrophy)    with urinary retention.  Renal u/s 12/04/19 NORMAL KIDNEYS, NORMAL BLADDER, NO HYDRONEPHROSIS  . CAD (coronary artery disease)    Nonobstructive by 12/2012 cath; then 03/2016 he required BMS to RCA ([facility]).  In-stent restenosis on cath 11/01/16, baloon angioplasty successful--pt to take plavix (no ASA), + eliquis (for PAF).  [name] CATARACT, HX OF   . Chronic combined systolic and diastolic CHF (  congestive heart failure) ([place]) 05/2016   Ischemic CM.  04/2018 EF 40-45%, grd I DD.  [name] Chronic pain syndrome    Lumbar DDD; chronic neuropathic pain (DM); has spinal stimulator and sees pain mgmt MD  . Complicated UTI (urinary tract infection) 09/2019   Phimoses, acute urinary retention, entoerococcus UTI, enterococc bacteremia.  F/u blood clx's neg x 5d.    [name] COPD   . Diabetes mellitus type 2 with complications (HCC)    [id number] jumped from 5.7% to 6.1% 03/2015---started metformin at that time.  DM 2 dx by fasting gluc criteria 2018.  Has chronic neuropath pain  . Elevated transaminase level 02/2016   Suspect due to fatty liver (documented on u/s 2007). Plan repeat labs 03/2016.  [name] Enterococcal bacteremia 09/2019   Phimoses, acute urinary retention, entoerococcus UTI, enterococc bacteremia.  F/u blood clx's neg x 5d.    . Essential hypertension, benign   . Fatty liver 2007   2007 u/s showed fatty liver with hepatosplenomegaly.   2019 repeat u/s->fatty liver but no cirrhosis or hepatosplenomegaly.  . FUO (fever of unknown origin) 11/26/2019  . GERD (gastroesophageal reflux disease)   . Glaucoma   . GOUT   . Hallux valgus (acquired)   . HH (hiatus hernia)   . HYPERCHOLESTEROLEMIA-PURE   . Hypogonadism male   . 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 Tommy Medal)  . Lumbosacral neuritis   . Lumbosacral spondylosis    Lumbar spinal stenosis with neurogenic claudication--contributes to his chronic pain syndrome  . Normal memory function 08/2014   Neuropsychological testing ([facility]): no cognitive impairment or sign of neurodegenerative disorder.  Likely has adjustment d/o with mixed anxiety/depressed features and may benefit from low dose SNRI.    [name] Normocytic anemia 03/2016   Mild-pt needs ferritin and vit B12 level checked (as of 03/22/16). Hb stable 09/2019  . NSTEMI (non-ST elevated myocardial infarction) ([place]) 03/20/2016   BMS to RCA  . Obesity hypoventilation syndrome ([place])   . Obesity, unspecified   . Orthostatic hypotension   . OSA on CPAP    8 cm H2O  . OSTEOARTHRITIS   . Paroxysmal atrial fibrillation ([place]) 2003    (? chronic?) Off anticoag for a while due to falls.  Then apixaban started 12/2014.  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 Ella Bodo): neuropathic pain->diff to treat, failed nucynta, failed spinal stimul trial, oxycontin hs + tramadol + gabap as of 12/2017 f/u Dr. Letta Pate.  . Personal history of colonic adenoma 10/30/2012   Diminutive adenoma, consider repeat 2019 per GI  . PFO (patent foramen ovale) 09/2019   small, with predominately L to R shunt  . Pneumonia   . Presence of cardiac defibrillator 11/07/2017  . Primary osteoarthritis of both knees    Bone on bone of medial compartments, + signif patellofemoral arth bilat.--supartz inj series  started 09/12/17  . PUD (peptic ulcer disease)   . PULMONARY HYPERTENSION, HX OF   . Secondary male hypogonadism 2017  . 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 Shortness of breath    with exertion: much improved s/p TAVR and treatment for CHF.  . Sick sinus syndrome (HCC)    PPM placed  . Thrombocytopenia ([place]) 2018   HSM on 2007 abd u/s---suspect some mild splenic sequestration chronically.  [name]  Unspecified glaucoma(365.9)   . Unspecified hereditary and idiopathic peripheral neuropathy approx age 17   bilat LE's, ? left arm, too.  Feet became progressively numb + left foot pain intermittently.  Pt may be trying a spinal stimulator (as of 05/2015)  . VENOUS INSUFFICIENCY    Being followed by Dr. Sharol Given as of 10/2016 for two R LL venous stasis ulcers/skin tears.  Healed as of 10/30/16 f/u with Dr. Sharol Given.  . VENTRAL HERNIA   . Wound dehiscence 10/22/2019    Family History  Problem Relation Age of Onset  . Hypertension Mother   . Coronary artery disease Mother   . Heart attack Mother   . Neuropathy Mother   . Pulmonary fibrosis Father        asbestosis    Past Surgical History:  Procedure Laterality Date  . AMPUTATION Left 04/11/2013   Procedure: AMPUTATION DIGIT Left 3rd toe;  Surgeon: [name], MD;  Location: [facility];  Service: Orthopedics;  Laterality: Left;  Left 3rd toe amputation at [facility]  . AMPUTATION Left 08/29/2019   Procedure: LEFT TRANSMETATARSAL AMPUTATION;  Surgeon: [name], MD;  Location: [facility];  Service: Orthopedics;  Laterality: Left;  . CARDIAC CATHETERIZATION  1997; 03/10/16   1997 Non-obstructive disease.  03/2016 BMS to RCA, with 25% pDiag dz, o/w normal cors per cath 03/07/16.  Cath 11/01/16: in stent restenosis, successful baloon angioplasty.  [name] CARDIAC CATHETERIZATION  12/24/2012   mild < 20% LCx, prox 30% RCA; LVEF 55-65% , moderate pulmonary HTN, moderate AS  . CARDIAC DEFIBRILLATOR PLACEMENT   11/07/2017   [facility] MRI Quad CRT defibrillator  . CARDIOVASCULAR STRESS TEST  05/11/16 ([facility])   Myocardial perfusion imaging:  No ischemia; scar in apex, global hypokinesis, EF 36%.  . Carotid dopplers  03/09/2016   [facility]: no hemodynamically significant stenosis on either side.  . CHOLECYSTECTOMY    . COLONOSCOPY    . COLONOSCOPY [name] 10/30/2012   Procedure: COLONOSCOPY;  Surgeon: [name], MD;  Location: [facility];  Service: Endoscopy;  Laterality: [name];  . CORONARY ANGIOPLASTY WITH STENT PLACEMENT  03/2016; 04/2017   2017-[facility]: BMS to RCA-pt was placed on Brilinta.  04/2017: DES to RCA.  . DORSAL SLIT [name] 10/29/2019   for severe phimosis. Procedure: DORSAL SLIT;  Surgeon: Alexis Frock, MD;  Location: [facility];  Service: Urology;  Laterality: [name];  38 MINS  . EYE SURGERY Bilateral cataract  . HEMORRHOID SURGERY    . INTRAOCULAR LENS INSERTION Bilateral   . KNEE SURGERY Right   . LEFT AND RIGHT HEART CATHETERIZATION WITH CORONARY ANGIOGRAM [name] 12/24/2012   Procedure: LEFT AND RIGHT HEART CATHETERIZATION WITH CORONARY ANGIOGRAM;  Surgeon: [name], MD;  Location: [facility] LAB;  Service: Cardiovascular;  Laterality: [name];  . LEG SURGERY Bilateral    lenghtening   . PACEMAKER PLACEMENT  04/13/2016   2nd deg HB after TAVR, pt had DC MDT PPM placed.  [name] SHOULDER ARTHROSCOPY  08/30/2011   Procedure: ARTHROSCOPY SHOULDER;  Surgeon: [name], MD;  Location: [facility];  Service: Orthopedics;  Laterality: Right;  Right Shoulder Arthroscopy, Debridement, and Decompression  . SPINAL CORD STIMULATOR INSERTION [name] 09/10/2015   Procedure: LUMBAR SPINAL CORD STIMULATOR INSERTION;  Surgeon: [name], MD;  Location: [facility];  Service: Neurosurgery;  Laterality: [name];  . TEE WITHOUT CARDIOVERSION [name] 09/16/2019   Procedure: TRANSESOPHAGEAL ECHOCARDIOGRAM (TEE);  Surgeon: Skeet Latch, MD;  Location: [facility];  Service: Cardiovascular;  Laterality: [name];  .  TEE WITHOUT  CARDIOVERSION [name] 12/02/2019   +vegetation on AVR and pacer lead in RV.  EF 55-60%, normal wall motion.  Valves function normal.  Procedure: TRANSESOPHAGEAL ECHOCARDIOGRAM (TEE);  Surgeon: Geralynn Rile, MD;  Location: [facility];  Service: Cardiovascular;  Laterality: [name];  . 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  R big toe surg due to osteoarth  . TONSILLECTOMY    . traeculectomy Left    eye  . TRANSCATHETER AORTIC VALVE REPLACEMENT, TRANSFEMORAL  04/11/2016  . TRANSESOPHAGEAL ECHOCARDIOGRAM  03/09/2016; 09/2019   [facility]: EF 55-60%, PFO seen with bi-directional shunting, no thrombus in appendage.  09/2019 ->no valvular vegetations. Small patent foramen ovale with predominantly left to right shunting across the interatrial septum.  . TRANSTHORACIC ECHOCARDIOGRAM  01/2015; 01/2016; 05/18/16; 09/18/16, 05/2017, 08/2017   01/2015 No signif change in aortic stenosis (moderate).  01/2016 Severe LVH w/small LV cavity, EF 60-65%, grade I diast dysfxn.  05/2016 (s/p TAVR): EF 50-55%, grd I DD, biopros AV good.  08/2016--EF 50-55%, LV septal motion c/w conduction abnl, grd I DD,mild MS,bioprosth aortic valve well seated, w/trace AR. 05/2017 TTE EF 35%. 08/2017-EF 35%, mod diff hypokin LV, grd I DD, biopros AV good.   . TRANSTHORACIC ECHOCARDIOGRAM  04/2018; 09/2019   04/2018: EF 40-45%, mod diffuse LV hypokinesis, grd I DD, bioprosth AV well seated, no AS or AR. 09/2019 EF 60-65%, grd I DD, valves fine, including bioprosth AV  . VITRECTOMY     Social History   Occupational History  . Occupation: Engineer, production    Comment: retired  Tobacco Use  . Smoking status: Never Smoker  . Smokeless tobacco: Never Used  Substance and Sexual Activity  . Alcohol use: No    Alcohol/week: 0.0 standard drinks  . Drug use: No    Types: Oxycodone  . Sexual activity: Not Currently

## 2019-12-19 ENCOUNTER — Ambulatory Visit: Payer: Medicare Other | Admitting: Family Medicine

## 2019-12-22 ENCOUNTER — Ambulatory Visit (INDEPENDENT_AMBULATORY_CARE_PROVIDER_SITE_OTHER): Payer: Medicare Other | Admitting: Infectious Disease

## 2019-12-22 ENCOUNTER — Telehealth: Payer: Self-pay | Admitting: *Deleted

## 2019-12-22 ENCOUNTER — Other Ambulatory Visit: Payer: Self-pay

## 2019-12-22 ENCOUNTER — Encounter: Payer: Self-pay | Admitting: Infectious Disease

## 2019-12-22 VITALS — BP 133/79 | HR 75 | Temp 98.6°F

## 2019-12-22 DIAGNOSIS — M86272 Subacute osteomyelitis, left ankle and foot: Secondary | ICD-10-CM | POA: Diagnosis not present

## 2019-12-22 DIAGNOSIS — I330 Acute and subacute infective endocarditis: Secondary | ICD-10-CM

## 2019-12-22 DIAGNOSIS — Z952 Presence of prosthetic heart valve: Secondary | ICD-10-CM | POA: Diagnosis not present

## 2019-12-22 DIAGNOSIS — B952 Enterococcus as the cause of diseases classified elsewhere: Secondary | ICD-10-CM

## 2019-12-22 DIAGNOSIS — R7881 Bacteremia: Secondary | ICD-10-CM | POA: Diagnosis not present

## 2019-12-22 DIAGNOSIS — T827XXD Infection and inflammatory reaction due to other cardiac and vascular devices, implants and grafts, subsequent encounter: Secondary | ICD-10-CM

## 2019-12-22 DIAGNOSIS — I38 Endocarditis, valve unspecified: Secondary | ICD-10-CM

## 2019-12-22 DIAGNOSIS — N390 Urinary tract infection, site not specified: Secondary | ICD-10-CM

## 2019-12-22 DIAGNOSIS — T827XXA Infection and inflammatory reaction due to other cardiac and vascular devices, implants and grafts, initial encounter: Secondary | ICD-10-CM

## 2019-12-22 DIAGNOSIS — T826XXA Infection and inflammatory reaction due to cardiac valve prosthesis, initial encounter: Secondary | ICD-10-CM

## 2019-12-22 DIAGNOSIS — T826XXD Infection and inflammatory reaction due to cardiac valve prosthesis, subsequent encounter: Secondary | ICD-10-CM | POA: Diagnosis not present

## 2019-12-22 HISTORY — DX: Infection and inflammatory reaction due to other cardiac and vascular devices, implants and grafts, initial encounter: T82.7XXA

## 2019-12-22 HISTORY — DX: Acute and subacute infective endocarditis: I33.0

## 2019-12-22 HISTORY — DX: Endocarditis, valve unspecified: I38

## 2019-12-22 HISTORY — DX: Infection and inflammatory reaction due to cardiac valve prosthesis, initial encounter: T82.6XXA

## 2019-12-22 MED ORDER — AMOXICILLIN 500 MG PO CAPS: 500.0000 mg | ORAL_CAPSULE | Freq: Three times a day (TID) | ORAL | 11 refills | Status: DC

## 2019-12-22 NOTE — Progress Notes (Signed)
Chief complaint: Follow-up for prosthetic valve endocarditis Subjective:    Patient ID: Tim [name], male    DOB: May 04, 1941, 79 y.o.   MRN: [id number]  HPI  79 y.o. male with  AMP sensitive enterococcal bacteremia from complicated urinary tract infection who also has an AICD and a TAVR.  Transthoracic echocardiogram and transesophageal echocardiogram were at that time both fortunately without evidence of infection of valves or device.He also recently underwent transmetatarsal amputation by Dr. Sharol Given for severe infection in his left foot.  We treated with 2 weeks of IV antibiotics.  He has been following with Dr. Sharol Given for his amputation site which is developed worsening erythema and drainage.  Dr. Sharol Given placed him on doxycycline but despite the doxycycline the patient has had systemic symptoms of malaise and fever to 101 degrees.  Areas had become more erythematous in the interim.  I had drawn blood cultures and labs today and change him from doxycycline to Zyvox.  He saw Janene Madeira in followup on the 5th of May. He  Reported significant imporvement in erythema  In  His foot in  24  Hours after starting zyvox. He completed zyvox prior to  Visit with Colletta Maryland.  He was continuing to have problems with dizziness and tunnel vision.    He has been complaining on and off of fevers to 102 to 103  Every other day. These came on shortly after stopping his zyvox.  He has had worsening dizziness with standing despite stopping his metoprolol and his diuretic and taking half of an imdur.  When I saw him in the clinic I ordered blood cultures which came back positive for Enterococcus.  We had admitted to the hospital and he was found to have a large vegetations on both the prosthetic valve and his pacemaker wire.  He is not felt to be a surgical candidate and therefore has been given dual beta-lactam therapy to complete 6 weeks in July which will follow with lifelong suppression with  amoxicillin 3 times daily.  Since having been admitted the hospital and placed on antibiotics he feels much better.  He is no longer have any problems with dizziness.  He has been followed by Dr. Sharol Given for his foot.  He says that that area is doing well.  He did have some anxiety about whether his cord stimulator could be infected because it was also foreign body.  However in talking to him his back pain is not worsened above its baseline he has pain with certain positions but certainly not with sitting and is not constant.       Past Medical History:  Diagnosis Date  . AICD (automatic cardioverter/defibrillator) present   . ASTHMA   . Asthma    as a child  . Balanitis    +severe phimosis+ buried penis->circ not possible but dorsal slit done 10/2019  . BPH (benign prostatic hypertrophy)    with urinary retention.  Renal u/s 12/04/19 NORMAL KIDNEYS, NORMAL BLADDER, NO HYDRONEPHROSIS  . CAD (coronary artery disease)    Nonobstructive by 12/2012 cath; then 03/2016 he required BMS to RCA ([facility]).  In-stent restenosis on cath 11/01/16, baloon angioplasty successful--pt to take plavix (no ASA), + eliquis (for PAF).  [name] CATARACT, HX OF   . Chronic combined systolic and diastolic CHF (congestive heart failure) ([place]) 05/2016   Ischemic CM.  04/2018 EF 40-45%, grd I DD.  [name] Chronic pain syndrome    Lumbar DDD; chronic neuropathic pain (DM); has spinal stimulator and sees  pain mgmt MD  . Complicated UTI (urinary tract infection) 09/2019   Phimoses, acute urinary retention, entoerococcus UTI, enterococc bacteremia.  F/u blood clx's neg x 5d.    [name] COPD   . Diabetes mellitus type 2 with complications (HCC)    QQP6P jumped from 5.7% to 6.1% 03/2015---started metformin at that time.  DM 2 dx by fasting gluc criteria 2018.  Has chronic neuropath pain  . Elevated transaminase level 02/2016   Suspect due to fatty liver (documented on u/s 2007). Plan repeat labs 03/2016.  [name] Enterococcal bacteremia 09/2019    Phimoses, acute urinary retention, entoerococcus UTI, enterococc bacteremia.  F/u blood clx's neg x 5d.    . Essential hypertension, benign   . Fatty liver 2007   2007 u/s showed fatty liver with hepatosplenomegaly.  2019 repeat u/s->fatty liver but no cirrhosis or hepatosplenomegaly.  . FUO (fever of unknown origin) 11/26/2019  . GERD (gastroesophageal reflux disease)   . Glaucoma   . GOUT   . Hallux valgus (acquired)   . HH (hiatus hernia)   . HYPERCHOLESTEROLEMIA-PURE   . Hypogonadism male   . 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 Tommy Medal)  . Lumbosacral neuritis   . Lumbosacral spondylosis    Lumbar spinal stenosis with neurogenic claudication--contributes to his chronic pain syndrome  . Normal memory function 08/2014   Neuropsychological testing ([facility]): no cognitive impairment or sign of neurodegenerative disorder.  Likely has adjustment d/o with mixed anxiety/depressed features and may benefit from low dose SNRI.    [name] Normocytic anemia 03/2016   Mild-pt needs ferritin and vit B12 level checked (as of 03/22/16). Hb stable 09/2019  . NSTEMI (non-ST elevated myocardial infarction) ([place]) 03/20/2016   BMS to RCA  . Obesity hypoventilation syndrome ([place])   . Obesity, unspecified   . Orthostatic hypotension   . OSA on CPAP    8 cm H2O  . OSTEOARTHRITIS   . Paroxysmal atrial fibrillation ([place]) 2003    (? chronic?) Off anticoag for a while due to falls.  Then apixaban started 12/2014.  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 Ella Bodo): neuropathic pain->diff to treat, failed nucynta, failed spinal stimul trial, oxycontin hs + tramadol + gabap as of 12/2017 f/u Dr. Letta Pate.  . Personal history of colonic adenoma 10/30/2012   Diminutive adenoma, consider repeat 2019 per GI  . PFO (patent foramen ovale) 09/2019   small, with  predominately L to R shunt  . Pneumonia   . Presence of cardiac defibrillator 11/07/2017  . Primary osteoarthritis of both knees    Bone on bone of medial compartments, + signif patellofemoral arth bilat.--supartz inj series started 09/12/17  . 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 PUD (peptic ulcer disease)   . PULMONARY HYPERTENSION, HX OF   . Secondary male hypogonadism 2017  . 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 Shortness of breath    with exertion: much improved s/p TAVR and treatment for CHF.  . Sick sinus syndrome (HCC)    PPM placed  . Thrombocytopenia ([place]) 2018   HSM on 2007 abd u/s---suspect some mild splenic sequestration chronically.  [name] Unspecified glaucoma(365.9)   . Unspecified hereditary and idiopathic peripheral neuropathy approx age 59   bilat LE's, ? left arm, too.  Feet became progressively numb +  left foot pain intermittently.  Pt may be trying a spinal stimulator (as of 05/2015)  . VENOUS INSUFFICIENCY    Being followed by Dr. Sharol Given as of 10/2016 for two R LL venous stasis ulcers/skin tears.  Healed as of 10/30/16 f/u with Dr. Sharol Given.  . VENTRAL HERNIA   . Wound dehiscence 10/22/2019    Past Surgical History:  Procedure Laterality Date  . AMPUTATION Left 04/11/2013   Procedure: AMPUTATION DIGIT Left 3rd toe;  Surgeon: [name], MD;  Location: [facility];  Service: Orthopedics;  Laterality: Left;  Left 3rd toe amputation at [facility]  . AMPUTATION Left 08/29/2019   Procedure: LEFT TRANSMETATARSAL AMPUTATION;  Surgeon: [name], MD;  Location: [facility];  Service: Orthopedics;  Laterality: Left;  . CARDIAC CATHETERIZATION  1997; 03/10/16   1997 Non-obstructive disease.  03/2016 BMS to RCA, with 25% pDiag dz, o/w normal cors per cath 03/07/16.  Cath 11/01/16: in stent restenosis, successful baloon angioplasty.  [name] CARDIAC CATHETERIZATION  12/24/2012   mild < 20% LCx, prox 30% RCA; LVEF 55-65% , moderate  pulmonary HTN, moderate AS  . CARDIAC DEFIBRILLATOR PLACEMENT  11/07/2017   [facility] MRI Quad CRT defibrillator  . CARDIOVASCULAR STRESS TEST  05/11/16 ([facility])   Myocardial perfusion imaging:  No ischemia; scar in apex, global hypokinesis, EF 36%.  . Carotid dopplers  03/09/2016   [facility]: no hemodynamically significant stenosis on either side.  . CHOLECYSTECTOMY    . COLONOSCOPY    . COLONOSCOPY [name] 10/30/2012   Procedure: COLONOSCOPY;  Surgeon: [name], MD;  Location: [facility];  Service: Endoscopy;  Laterality: [name];  . CORONARY ANGIOPLASTY WITH STENT PLACEMENT  03/2016; 04/2017   2017-[facility]: BMS to RCA-pt was placed on Brilinta.  04/2017: DES to RCA.  . DORSAL SLIT [name] 10/29/2019   for severe phimosis. Procedure: DORSAL SLIT;  Surgeon: Alexis Frock, MD;  Location: [facility];  Service: Urology;  Laterality: [name];  11 MINS  . EYE SURGERY Bilateral cataract  . HEMORRHOID SURGERY    . INTRAOCULAR LENS INSERTION Bilateral   . KNEE SURGERY Right   . LEFT AND RIGHT HEART CATHETERIZATION WITH CORONARY ANGIOGRAM [name] 12/24/2012   Procedure: LEFT AND RIGHT HEART CATHETERIZATION WITH CORONARY ANGIOGRAM;  Surgeon: [name], MD;  [facility] CATH LAB;  Service: Cardiovascular;  Laterality: [name];  . LEG SURGERY Bilateral    lenghtening   . PACEMAKER PLACEMENT  04/13/2016   2nd deg HB after TAVR, pt had DC MDT PPM placed.  [name] SHOULDER ARTHROSCOPY  08/30/2011   Procedure: ARTHROSCOPY SHOULDER;  Surgeon: [name], MD;  Location: [facility];  Service: Orthopedics;  Laterality: Right;  Right Shoulder Arthroscopy, Debridement, and Decompression  . SPINAL CORD STIMULATOR INSERTION [name] 09/10/2015   Procedure: LUMBAR SPINAL CORD STIMULATOR INSERTION;  Surgeon: [name], MD;  Location: [facility];  Service: Neurosurgery;  Laterality: [name];  . TEE WITHOUT CARDIOVERSION [name] 09/16/2019   Procedure: TRANSESOPHAGEAL ECHOCARDIOGRAM (TEE);  Surgeon: Skeet Latch, MD;  Location: [facility];   Service: Cardiovascular;  Laterality: [name];  . TEE WITHOUT CARDIOVERSION [name] 12/02/2019   +vegetation on AVR and pacer lead in RV.  EF 55-60%, normal wall motion.  Valves function normal.  Procedure: TRANSESOPHAGEAL ECHOCARDIOGRAM (TEE);  Surgeon: Geralynn Rile, MD;  Location: [facility];  Service: Cardiovascular;  Laterality: [name];  . 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  R big toe surg due to osteoarth  . TONSILLECTOMY    . traeculectomy Left  eye  . TRANSCATHETER AORTIC VALVE REPLACEMENT, TRANSFEMORAL  04/11/2016  . TRANSESOPHAGEAL ECHOCARDIOGRAM  03/09/2016; 09/2019   [facility]: EF 55-60%, PFO seen with bi-directional shunting, no thrombus in appendage.  09/2019 ->no valvular vegetations. Small patent foramen ovale with predominantly left to right shunting across the interatrial septum.  . TRANSTHORACIC ECHOCARDIOGRAM  01/2015; 01/2016; 05/18/16; 09/18/16, 05/2017, 08/2017   01/2015 No signif change in aortic stenosis (moderate).  01/2016 Severe LVH w/small LV cavity, EF 60-65%, grade I diast dysfxn.  05/2016 (s/p TAVR): EF 50-55%, grd I DD, biopros AV good.  08/2016--EF 50-55%, LV septal motion c/w conduction abnl, grd I DD,mild MS,bioprosth aortic valve well seated, w/trace AR. 05/2017 TTE EF 35%. 08/2017-EF 35%, mod diff hypokin LV, grd I DD, biopros AV good.   . TRANSTHORACIC ECHOCARDIOGRAM  04/2018; 09/2019   04/2018: EF 40-45%, mod diffuse LV hypokinesis, grd I DD, bioprosth AV well seated, no AS or AR. 09/2019 EF 60-65%, grd I DD, valves fine, including bioprosth AV  . VITRECTOMY      Family History  Problem Relation Age of Onset  . Hypertension Mother   . Coronary artery disease Mother   . Heart attack Mother   . Neuropathy Mother   . Pulmonary fibrosis Father        asbestosis      Social History   Socioeconomic History  . Marital status: Married    Spouse name: Not on file  . Number of children: 0  . Years of education: 20  . Highest education level: Not on file    Occupational History  . Occupation: Engineer, production    Comment: retired  Tobacco Use  . Smoking status: Never Smoker  . Smokeless tobacco: Never Used  Substance and Sexual Activity  . Alcohol use: No    Alcohol/week: 0.0 standard drinks  . Drug use: No    Types: Oxycodone  . Sexual activity: Not Currently  Other Topics Concern  . Not on file  Social History Narrative   HSG, [name] - BS, [name] - MS-engineering, 2 years on PhD - [place]. Married - '65 - 81yr/divorced; '76- 3 yrs/divorced; '92 . No children. 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 ACP/Living Will - Yes CPR; long-term Mechanical ventilation as long as he was able to cognate; ok for long term artificial nutrition. Precondition being able to cognate and not to have too much pain.    Social Determinants of Health   Financial Resource Strain:   . Difficulty of Paying Living Expenses:   Food Insecurity:   . Worried About RCharity fundraiserin the Last Year:   . RArboriculturistin the Last Year:   Transportation Needs:   . LFilm/video editor(Medical):   .Marland KitchenLack of Transportation (Non-Medical):   Physical Activity:   . Days of Exercise per Week:   . Minutes of Exercise per Session:   Stress:   . Feeling of Stress :   Social Connections:   . Frequency of Communication with Friends and Family:   . Frequency of Social Gatherings with Friends and Family:   . Attends Religious Services:   . Active Member of Clubs or Organizations:   . Attends CArchivistMeetings:   .Marland KitchenMarital Status:     Allergies  Allergen Reactions  . Brimonidine Tartrate Shortness Of Breath    Alphagan-Shortness of breath  . Brinzolamide Shortness Of Breath    AZOPT-  Shortness of breath  . Latanoprost Shortness Of Breath    XALATAN- Shortness of breath  . Nucynta [Tapentadol] Shortness Of Breath  . Sulfa Antibiotics Palpitations  . Timolol Maleate Shortness Of Breath and Other  (See Comments)    TIMOPTIC- Aggravated asthma  . Diltiazem Swelling     leg swelling  . Rofecoxib Swelling     VIOXX- leg swelling  . Vancomycin Hives and Other (See Comments)    Possible "Red Man Syndrome"? > hives/blisters  . Codeine Other (See Comments)    Childhood reaction  . Tamsulosin Other (See Comments)    Dizziness   . Celecoxib Other (See Comments)    CELLBREX-confusion  . Colchicine Diarrhea    diarrhea  . Tape Rash     Current Outpatient Medications:  .  albuterol (PROVENTIL HFA;VENTOLIN HFA) 108 (90 BASE) MCG/ACT inhaler, Inhale 2 puffs into the lungs 4 (four) times daily as needed for wheezing or shortness of breath., Disp: , Rfl:  .  allopurinol (ZYLOPRIM) 300 MG tablet, TAKE 1 TABLET BY MOUTH ONCE DAILY WITH FOOD, Disp: 90 tablet, Rfl: 1 .  apixaban (ELIQUIS) 5 MG TABS tablet, Take 5 mg by mouth 2 (two) times daily., Disp: , Rfl:  .  ASPIRIN LOW DOSE 81 MG EC tablet, Take 81 mg by mouth daily., Disp: , Rfl:  .  B Complex-C (B-COMPLEX WITH VITAMIN C) tablet, Take 1 tablet by mouth daily. Take 1 tablet daily, 255m, Disp: , Rfl:  .  cefTRIAXone (ROCEPHIN) IVPB, Inject 2 g into the vein every 12 (twelve) hours. Indication: prosthetiv valve enterococcal endocarditis First Dose: No Last Day of Therapy:  01/09/20 Labs - Once weekly:  CBC/D and BMP, Labs - Every other week:  ESR and CRP Method of administration: IV Push Method of administration may be changed at the discretion of home infusion pharmacist based upon assessment of the patient and/or caregiver's ability to self-administer the medication ordered., Disp: 74 Units, Rfl: 0 .  ezetimibe (ZETIA) 10 MG tablet, Take 1 tablet (10 mg total) by mouth daily., Disp: 30 tablet, Rfl: 1 .  finasteride (PROSCAR) 5 MG tablet, Take 1 tablet (5 mg total) by mouth daily., Disp: 90 tablet, Rfl: 0 .  fluticasone (FLONASE) 50 MCG/ACT nasal spray, Place 2 sprays into both nostrils as needed for allergies or rhinitis., Disp: , Rfl:  .   Fluticasone-Salmeterol (ADVAIR) 250-50 MCG/DOSE AEPB, Inhale 1 puff into the lungs 2 (two) times daily., Disp: 60 each, Rfl: 1 .  gabapentin (NEURONTIN) 800 MG tablet, TAKE 1 TABLET (800 MG TOTAL) BY MOUTH 4 (FOUR) TIMES DAILY., Disp: 360 tablet, Rfl: 2 .  isosorbide mononitrate (IMDUR) 30 MG 24 hr tablet, Take 30 mg by mouth daily., Disp: , Rfl:  .  niacin (NIASPAN) 1000 MG CR tablet, Take 1 tablet (1,000 mg total) by mouth 2 (two) times daily., Disp: 180 tablet, Rfl: 0 .  nitroGLYCERIN (NITROSTAT) 0.4 MG SL tablet, Place 0.4 mg under the tongue every 5 (five) minutes as needed for chest pain. , Disp: , Rfl:  .  pantoprazole (PROTONIX) 40 MG tablet, Take 1 tablet (40 mg total) by mouth daily., Disp: 30 tablet, Rfl: 0 .  rosuvastatin (CRESTOR) 40 MG tablet, take 1 tablet by mouth once daily (Patient taking differently: Take 40 mg by mouth daily. ), Disp: 15 tablet, Rfl: 0 .  sacubitril-valsartan (ENTRESTO) 24-26 MG, Take 0.5 tablets by mouth 2 (two) times daily. , Disp: , Rfl:  .  senna-docusate (SENOKOT-S) 8.6-50 MG tablet,  Take 1 tablet by mouth 2 (two) times daily. While taking strong pain meds to prevent constipation., Disp: 10 tablet, Rfl: 0 .  torsemide (DEMADEX) 20 MG tablet, Take 20 mg by mouth 2 (two) times daily as needed (swelling). , Disp: , Rfl:  .  vitamin C (ASCORBIC ACID) 250 MG tablet, Take 250 mg by mouth daily., Disp: , Rfl:  .  VITAMIN D, CHOLECALCIFEROL, PO, Take 1 tablet by mouth daily. , Disp: , Rfl:    Review of Systems  Constitutional: Negative for activity change, appetite change, chills, diaphoresis, fatigue, fever and unexpected weight change.  HENT: Negative for congestion, rhinorrhea, sinus pressure, sneezing, sore throat and trouble swallowing.   Eyes: Negative for photophobia and visual disturbance.  Respiratory: Negative for cough, chest tightness, shortness of breath, wheezing and stridor.   Cardiovascular: Negative for chest pain, palpitations and leg swelling.    Gastrointestinal: Negative for abdominal distention, abdominal pain, anal bleeding, blood in stool, constipation, diarrhea, nausea and vomiting.  Genitourinary: Negative for difficulty urinating, dysuria, flank pain and hematuria.  Musculoskeletal: Negative for arthralgias, back pain, gait problem, joint swelling and myalgias.  Skin: Positive for color change and wound. Negative for pallor and rash.  Neurological: Negative for dizziness, tremors, weakness and light-headedness.  Hematological: Negative for adenopathy. Does not bruise/bleed easily.  Psychiatric/Behavioral: Negative for agitation, behavioral problems, confusion, decreased concentration, dysphoric mood and sleep disturbance.       Objective:   Physical Exam Constitutional:      General: He is not in acute distress.    Appearance: Normal appearance. He is well-developed. He is not ill-appearing or diaphoretic.  HENT:     Head: Normocephalic and atraumatic.     Right Ear: Hearing and external ear normal.     Left Ear: Hearing and external ear normal.     Nose: No nasal deformity or rhinorrhea.  Eyes:     General: No scleral icterus.    Conjunctiva/sclera: Conjunctivae normal.     Right eye: Right conjunctiva is not injected.     Left eye: Left conjunctiva is not injected.     Pupils: Pupils are equal, round, and reactive to light.  Neck:     Vascular: No JVD.  Cardiovascular:     Rate and Rhythm: Normal rate and regular rhythm.     Heart sounds: Normal heart sounds, S1 normal and S2 normal. No murmur heard.  No friction rub. No gallop.   Pulmonary:     Effort: Pulmonary effort is normal. No respiratory distress.     Breath sounds: Normal breath sounds. No stridor. No wheezing.  Abdominal:     General: Bowel sounds are normal. There is no distension.     Palpations: Abdomen is soft.  Musculoskeletal:        General: Normal range of motion.     Right shoulder: Normal.     Left shoulder: Normal.     Cervical back:  Normal range of motion and neck supple.     Right hip: Normal.     Left hip: Normal.     Right knee: Normal.     Left knee: Normal.  Lymphadenopathy:     Head:     Right side of head: No submandibular, preauricular or posterior auricular adenopathy.     Left side of head: No submandibular, preauricular or posterior auricular adenopathy.     Cervical: No cervical adenopathy.     Right cervical: No superficial or deep cervical adenopathy.    Left  cervical: No superficial or deep cervical adenopathy.  Skin:    General: Skin is warm and dry.     Coloration: Skin is not pale.     Findings: No abrasion, bruising, ecchymosis, erythema, lesion or rash.     Nails: There is no clubbing.  Neurological:     General: No focal deficit present.     Mental Status: He is alert and oriented to person, place, and time.     Sensory: No sensory deficit.     Coordination: Coordination normal.     Gait: Gait normal.  Psychiatric:        Attention and Perception: Attention normal. He is attentive.        Mood and Affect: Mood normal.        Speech: Speech normal.        Behavior: Behavior normal. Behavior is cooperative.        Thought Content: Thought content normal.     PICC line clean dry and intact.  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  I will see him back in 2 months time.  Enterococcal bacteremia: We will check blood cultures today   Amputation site continue to monitor closely

## 2019-12-22 NOTE — Telephone Encounter (Signed)
Relayed verbal order per Dr Tommy Medal to pull picc after last dose. Last dose 7/9, ok to pull 7/10.  Patient will be starting oral antibiotics 7/10. Order repeated and verified. Landis Gandy, RN

## 2019-12-23 ENCOUNTER — Encounter: Payer: Self-pay | Admitting: Infectious Disease

## 2019-12-23 ENCOUNTER — Other Ambulatory Visit: Payer: Self-pay | Admitting: Family Medicine

## 2019-12-26 ENCOUNTER — Telehealth: Payer: Self-pay

## 2019-12-26 NOTE — Telephone Encounter (Signed)
[facility] called in about the referral sent to there office for patient. stating that the referral was for a  neurologist and they only do sleep study   Patient needs another referral sent to another Dr   Please advise

## 2019-12-26 NOTE — Telephone Encounter (Signed)
New referral needed for urologist, last one completed on 6/15 but they only do sleep study.   Please advise, thanks.

## 2019-12-29 NOTE — Telephone Encounter (Signed)
I'm confused: Last visit he requested 2 new referrals--> one to a pulmonologist to evaluate his copd and another to a NEW urologist b/c he wasn't satisfied with [facility]. Which referral is he needing further help with ?

## 2019-12-29 NOTE — Telephone Encounter (Signed)
Patient needs help with new urology referral

## 2019-12-30 ENCOUNTER — Other Ambulatory Visit: Payer: Self-pay

## 2019-12-30 ENCOUNTER — Ambulatory Visit (INDEPENDENT_AMBULATORY_CARE_PROVIDER_SITE_OTHER): Payer: Medicare Other | Admitting: Physical Medicine and Rehabilitation

## 2019-12-30 DIAGNOSIS — Z9689 Presence of other specified functional implants: Secondary | ICD-10-CM | POA: Diagnosis not present

## 2019-12-30 DIAGNOSIS — E1140 Type 2 diabetes mellitus with diabetic neuropathy, unspecified: Secondary | ICD-10-CM

## 2019-12-30 DIAGNOSIS — M48062 Spinal stenosis, lumbar region with neurogenic claudication: Secondary | ICD-10-CM

## 2019-12-30 NOTE — Telephone Encounter (Signed)
I ordered this referral back on 6/15. What happened with that one?

## 2019-12-31 ENCOUNTER — Encounter: Payer: Self-pay | Admitting: Physical Medicine and Rehabilitation

## 2019-12-31 NOTE — Telephone Encounter (Signed)
Can you please look into this referral?

## 2019-12-31 NOTE — Progress Notes (Signed)
Patient has history of prior spinal cord stimulator placement in 2017.  He is followed in the office mainly by Dr. Meridee Score with history of diabetes as well as peripheral neuropathy and complications from both with amputation.  He came in today for a nurse visit to have [employer] from [place] reprogram his spinal cord stimulator.

## 2020-01-01 ENCOUNTER — Ambulatory Visit: Payer: Medicare Other | Admitting: Family Medicine

## 2020-01-06 ENCOUNTER — Encounter: Payer: Self-pay | Admitting: Infectious Disease

## 2020-01-06 ENCOUNTER — Telehealth: Payer: Self-pay | Admitting: *Deleted

## 2020-01-06 NOTE — Telephone Encounter (Signed)
Almyra Free RN caring for patient called to get pull PICC order. Advised her per note from 12-11-19 PICC can be pulled 01/10/20 after last dose 01-09-20.

## 2020-01-07 ENCOUNTER — Other Ambulatory Visit: Payer: Self-pay | Admitting: Physical Medicine & Rehabilitation

## 2020-01-09 ENCOUNTER — Other Ambulatory Visit: Payer: Self-pay | Admitting: Family Medicine

## 2020-01-09 ENCOUNTER — Telehealth: Payer: Self-pay

## 2020-01-09 NOTE — Telephone Encounter (Signed)
Refill request from pharmacy for pantoprazole 4m  Last filled by Dr AKarie Kirks  Please advise if refill is appropriate?

## 2020-01-09 NOTE — Telephone Encounter (Signed)
Patient called office today with questions regarding medication schedule. Would like to know if he could take his medication at noon, 6 pm, midnight. Amoxicillin instructions are to take on caps 3 times a day. Would like to know if 6 hours is enough time to space medication or if it needs to be 8 hours.  Spoke with Cassie, Pharmacist who states 6 hours apart should be okay. Tim [name]

## 2020-01-12 ENCOUNTER — Other Ambulatory Visit: Payer: Self-pay

## 2020-01-12 ENCOUNTER — Ambulatory Visit (INDEPENDENT_AMBULATORY_CARE_PROVIDER_SITE_OTHER): Payer: Medicare Other | Admitting: Orthopedic Surgery

## 2020-01-12 ENCOUNTER — Encounter: Payer: Self-pay | Admitting: Orthopedic Surgery

## 2020-01-12 VITALS — Ht 72.0 in | Wt 318.1 lb

## 2020-01-12 DIAGNOSIS — T8781 Dehiscence of amputation stump: Secondary | ICD-10-CM

## 2020-01-12 DIAGNOSIS — Z89432 Acquired absence of left foot: Secondary | ICD-10-CM

## 2020-01-12 DIAGNOSIS — I87332 Chronic venous hypertension (idiopathic) with ulcer and inflammation of left lower extremity: Secondary | ICD-10-CM | POA: Diagnosis not present

## 2020-01-12 DIAGNOSIS — L97929 Non-pressure chronic ulcer of unspecified part of left lower leg with unspecified severity: Secondary | ICD-10-CM | POA: Diagnosis not present

## 2020-01-13 ENCOUNTER — Telehealth: Payer: Self-pay

## 2020-01-13 ENCOUNTER — Encounter: Payer: Self-pay | Admitting: Orthopedic Surgery

## 2020-01-13 NOTE — Telephone Encounter (Signed)
Patient called office to update provider on his foot. States that one week ago he noticed small wound on his foot that would not heal. Has been wrapping up foot.  States patient that he saw Ortho team yesterday and thinks wound could be related to infection. Will forward message to provider to advise on what patient should do. Will follow up with ortho team at this time and call office with updates. [place]

## 2020-01-13 NOTE — Progress Notes (Signed)
Office Visit Note   Patient: Tim [name]           Date of Birth: Nov 09, 1940           MRN: [id number] Visit Date: 01/12/2020              Requested by: Tammi Sou, MD [id number] [place],  [place] [address] PCP: Tammi Sou, MD  Chief Complaint  [name]    08/29/19 Left TMA      HPI: Patient is an 79 year old gentleman who presents 4 1/2 months status post left transmetatarsal amputation he is currently using a CircAid wrap for the venous insufficiency.  Patient states that he has developed bleeding over the mid aspect of the surgical incision about a week ago.  Patient states he is using Iodosorb on the wound he is currently on amoxicillin 3 times a day he states that this is a lifetime antibiotic for an infected heart valve and infected pacemaker leads.  Assessment & Plan: Visit Diagnoses:  1. Chronic venous hypertension (idiopathic) with ulcer and inflammation of left lower extremity (HCC)   2. History of transmetatarsal amputation of left foot ([place])   3. Dehiscence of amputation stump (HCC)     Plan: The ulcer was debrided of skin and soft tissue there is no exposed bone patient will resume wound care continue with his compression follow-up in 3 weeks  Follow-Up Instructions: Return in about 3 weeks (around 02/02/2020).   Ortho Exam  [name], oriented, no adenopathy, well-dressed, normal affect, normal respiratory effort. Examination patient has a new ulcer over the distal mid aspect of the transmetatarsal.  There is no ascending cellulitis no drainage.  After informed consent a 10 blade knife was used to debride the skin and soft tissue back to healthy viable granulation tissue before debridement the callused area was 5 mm in diameter after debridement the wound bed is 20 mm in diameter 5 mm deep this does not extend to bone or tendon no deep abscess silver nitrate was used for hemostasis there was good  bleeding granulation tissue.  Imaging: No results found. No images are attached to the encounter.  Labs: Lab Results  Component Value Date   HGBA1C 5.6 10/02/2019   HGBA1C 6.1 03/27/2019   HGBA1C 6.1 09/16/2018   ESRSEDRATE 20 02/09/2016   ESRSEDRATE 36 (H) 01/28/2014   CRP 2.5 (H) 11/26/2019   LABURIC 3.5 (L) 09/10/2013   REPTSTATUS 12/06/2019 FINAL 12/04/2019   CULT  12/04/2019    NO GROWTH Performed at [facility], [place] [address], [place], [place] [address]    LABORGA ENTEROCOCCUS FAECALIS 09/11/2019     Lab Results  Component Value Date   ALBUMIN 2.5 (L) 11/28/2019   ALBUMIN 2.9 (L) 11/27/2019   ALBUMIN 2.9 (L) 11/26/2019   LABURIC 3.5 (L) 09/10/2013    Lab Results  Component Value Date   MG 1.8 12/03/2019   MG 1.4 (L) 12/02/2019   MG 1.8 09/17/2019   No results found for: VD25OH  No results found for: PREALBUMIN CBC EXTENDED Latest Ref Rng & Units 12/06/2019 12/05/2019 12/04/2019  WBC 4.0 - 10.5 K/uL 6.3 6.0 5.5  RBC 4.22 - 5.81 MIL/uL 3.25(L) 3.24(L) 3.33(L)  HGB 13.0 - 17.0 g/dL 9.4(L) 9.4(L) 9.6(L)  HCT 39 - 52 % 30.5(L) 30.1(L) 30.7(L)  PLT 150 - 400 K/uL 139(L) 134(L) 148(L)  NEUTROABS 1.7 - 7.7 K/uL 4.5 3.9  3.6  LYMPHSABS 0.7 - 4.0 K/uL 0.9 1.3 1.2     Body mass index is 43.14 kg/m.  Orders:  No orders of the defined types were placed in this encounter.  No orders of the defined types were placed in this encounter.    Procedures: No procedures performed  Clinical Data: No additional findings.  ROS:  All other systems negative, except as noted in the HPI. Review of Systems  Objective: Vital Signs: Ht 6' (1.829 m)   Wt (!) 318 lb 1.9 oz (144.3 kg)   BMI 43.14 kg/m   Specialty Comments:  No specialty comments available.  PMFS History: Patient Active Problem List   Diagnosis Date Noted  . 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 ICD (implantable cardioverter-defibrillator) infection ([place]) 12/22/2019  .  Gram-positive bacteremia 11/27/2019  . FUO (fever of unknown origin) 11/26/2019  . Low blood pressure reading 11/05/2019  . Wound dehiscence 10/22/2019  . PFO (patent foramen ovale) 10/06/2019  . ICD (implantable cardioverter-defibrillator) battery depletion   . Pressure injury of skin 09/13/2019  . Enterococcal bacteremia history  09/13/2019  . 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  . 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 Urinary retention 09/13/2019  . Phimosis 09/13/2019  . SIRS (systemic inflammatory response syndrome) ([place]) 09/12/2019  . Sepsis ([place]) 09/12/2019  . Enterococcus UTI 09/12/2019  . Cellulitis of foot   . History of transmetatarsal amputation of left foot ([place])   . Subacute osteomyelitis, left ankle and foot ([place])   . 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 ([place]) 04/17/2018  . Diabetic neuropathy, painful ([place]) 12/26/2016  . Neuropathic pain of foot, left 06/20/2016  . 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 Status post amputation of left great toe ([place]) 06/08/2015  . Spinal stenosis of lumbar region 02/16/2015  . Hereditary and idiopathic peripheral neuropathy 01/12/2015  . Oral thrush 12/28/2014  . Orthostatic dizziness 05/24/2014  . Paresthesias with subjective weakness 01/30/2014  . Bilateral thigh pain 01/30/2014  . Abdominal wall pain in right upper quadrant 01/22/2014  . Gross hematuria 09/25/2013  . Intertrigo 09/25/2013  . IBS (irritable bowel syndrome) 09/11/2013  . Chronic pain syndrome 08/06/2013  . CAD (coronary artery disease), native coronary artery 12/25/2012  . Paroxysmal atrial fibrillation ([place]) 12/25/2012  . Moderate aortic stenosis 12/25/2012  . Aortic stenosis 12/24/2012  . Personal history of colonic adenoma 10/30/2012  . Diverticulosis of colon (without mention of hemorrhage) 10/30/2012  . Internal hemorrhoids 10/30/2012  . Change in bowel  habits intermittent loose stools 10/30/2012  . Routine health maintenance 06/09/2012  . Hereditary peripheral neuropathy 10/10/2011  . Long term current use of anticoagulant - apixiban 08/16/2010  . Dyspnea on exertion 09/08/2009  . HYPERCHOLESTEROLEMIA-PURE 01/13/2009  . Essential hypertension, benign 01/13/2009  . EDEMA 01/13/2009  . GOUT 01/12/2009  . GLAUCOMA 01/12/2009  . Venous insufficiency of both lower extremities 01/12/2009  . COPD (chronic obstructive pulmonary disease) ([place]) 01/12/2009  . VENTRAL HERNIA 01/12/2009  . OSTEOARTHRITIS 01/12/2009  . ARTHRITIS 01/12/2009  . DEGENERATIVE DISC DISEASE 01/12/2009  . CATARACT, HX OF 01/12/2009  . HEART MURMUR, HX OF 01/12/2009  . BENIGN PROSTATIC HYPERTROPHY, HX OF 01/12/2009  . Obesity 01/17/2007  . OBSTRUCTIVE SLEEP APNEA 01/17/2007  . ASTHMA 01/17/2007  . GERD 01/17/2007  . HALLUX VALGUS, ACQUIRED 01/17/2007  . Pulmonary hypertension ([place]) 01/17/2007   Past Medical History:  Diagnosis Date  . AICD (automatic cardioverter/defibrillator) present   . ASTHMA   .  Asthma    as a child  . Balanitis    +severe phimosis+ buried penis->circ not possible but dorsal slit done 10/2019  . BPH (benign prostatic hypertrophy)    with urinary retention.  Renal u/s 12/04/19 NORMAL KIDNEYS, NORMAL BLADDER, NO HYDRONEPHROSIS  . CAD (coronary artery disease)    Nonobstructive by 12/2012 cath; then 03/2016 he required BMS to RCA ([facility]).  In-stent restenosis on cath 11/01/16, baloon angioplasty successful--pt to take plavix (no ASA), + eliquis (for PAF).  [name] CATARACT, HX OF   . Chronic combined systolic and diastolic CHF (congestive heart failure) ([place]) 05/2016   Ischemic CM.  04/2018 EF 40-45%, grd I DD.  [name] Chronic pain syndrome    Lumbar DDD; chronic neuropathic pain (DM); has spinal stimulator and sees pain mgmt MD  . Complicated UTI (urinary tract infection) 09/2019   Phimoses, acute urinary retention, entoerococcus UTI, enterococc  bacteremia.  F/u blood clx's neg x 5d.    [name] COPD   . Diabetes mellitus type 2 with complications (HCC)    IBB0W jumped from 5.7% to 6.1% 03/2015---started metformin at that time.  DM 2 dx by fasting gluc criteria 2018.  Has chronic neuropath pain  . Elevated transaminase level 02/2016   Suspect due to fatty liver (documented on u/s 2007). Plan repeat labs 03/2016.  [name] Enterococcal bacteremia 09/2019   Phimoses, acute urinary retention, entoerococcus UTI, enterococc bacteremia.  F/u blood clx's neg x 5d.    . Essential hypertension, benign   . Fatty liver 2007   2007 u/s showed fatty liver with hepatosplenomegaly.  2019 repeat u/s->fatty liver but no cirrhosis or hepatosplenomegaly.  . FUO (fever of unknown origin) 11/26/2019  . GERD (gastroesophageal reflux disease)   . Glaucoma   . GOUT   . Hallux valgus (acquired)   . HH (hiatus hernia)   . HYPERCHOLESTEROLEMIA-PURE   . Hypogonadism male   . ICD (implantable cardioverter-defibrillator) infection ([place]) 12/22/2019  . 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 Tommy Medal)  . Lumbosacral neuritis   . Lumbosacral spondylosis    Lumbar spinal stenosis with neurogenic claudication--contributes to his chronic pain syndrome  . Normal memory function 08/2014   Neuropsychological testing ([facility]): no cognitive impairment or sign of neurodegenerative disorder.  Likely has adjustment d/o with mixed anxiety/depressed features and may benefit from low dose SNRI.    [name] Normocytic anemia 03/2016   Mild-pt needs ferritin and vit B12 level checked (as of 03/22/16). Hb stable 09/2019  . NSTEMI (non-ST elevated myocardial infarction) ([place]) 03/20/2016   BMS to RCA  . Obesity hypoventilation syndrome ([place])   . Obesity, unspecified   . Orthostatic hypotension   . OSA on CPAP    8 cm H2O  . OSTEOARTHRITIS   . Paroxysmal atrial fibrillation ([place]) 2003     (? chronic?) Off anticoag for a while due to falls.  Then apixaban started 12/2014.  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 Ella Bodo): neuropathic pain->diff to treat, failed nucynta, failed spinal stimul trial, oxycontin hs + tramadol + gabap as of 12/2017 f/u Dr. Letta Pate.  . Personal history of colonic adenoma 10/30/2012   Diminutive adenoma, consider repeat 2019 per GI  . PFO (patent foramen ovale) 09/2019   small, with predominately L to R shunt  . Pneumonia   . Presence of cardiac defibrillator 11/07/2017  . Primary osteoarthritis of both knees  Bone on bone of medial compartments, + signif patellofemoral arth bilat.--supartz inj series started 09/12/17  . 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 PUD (peptic ulcer disease)   . PULMONARY HYPERTENSION, HX OF   . Secondary male hypogonadism 2017  . 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 Shortness of breath    with exertion: much improved s/p TAVR and treatment for CHF.  . Sick sinus syndrome (HCC)    PPM placed  . Thrombocytopenia ([place]) 2018   HSM on 2007 abd u/s---suspect some mild splenic sequestration chronically.  [name] Unspecified glaucoma(365.9)   . Unspecified hereditary and idiopathic peripheral neuropathy approx age 84   bilat LE's, ? left arm, too.  Feet became progressively numb + left foot pain intermittently.  Pt may be trying a spinal stimulator (as of 05/2015)  . VENOUS INSUFFICIENCY    Being followed by Dr. Sharol Given as of 10/2016 for two R LL venous stasis ulcers/skin tears.  Healed as of 10/30/16 f/u with Dr. Sharol Given.  . VENTRAL HERNIA   . Wound dehiscence 10/22/2019    Family History  Problem Relation Age of Onset  . Hypertension Mother   . Coronary artery disease Mother   . Heart attack Mother   . Neuropathy Mother   . Pulmonary fibrosis Father        asbestosis    Past Surgical History:  Procedure  Laterality Date  . AMPUTATION Left 04/11/2013   Procedure: AMPUTATION DIGIT Left 3rd toe;  Surgeon: [name], MD;  Location: [facility];  Service: Orthopedics;  Laterality: Left;  Left 3rd toe amputation at [facility]  . AMPUTATION Left 08/29/2019   Procedure: LEFT TRANSMETATARSAL AMPUTATION;  Surgeon: [name], MD;  Location: [facility];  Service: Orthopedics;  Laterality: Left;  . CARDIAC CATHETERIZATION  1997; 03/10/16   1997 Non-obstructive disease.  03/2016 BMS to RCA, with 25% pDiag dz, o/w normal cors per cath 03/07/16.  Cath 11/01/16: in stent restenosis, successful baloon angioplasty.  [name] CARDIAC CATHETERIZATION  12/24/2012   mild < 20% LCx, prox 30% RCA; LVEF 55-65% , moderate pulmonary HTN, moderate AS  . CARDIAC DEFIBRILLATOR PLACEMENT  11/07/2017   [facility] MRI Quad CRT defibrillator  . CARDIOVASCULAR STRESS TEST  05/11/16 ([facility])   Myocardial perfusion imaging:  No ischemia; scar in apex, global hypokinesis, EF 36%.  . Carotid dopplers  03/09/2016   [facility]: no hemodynamically significant stenosis on either side.  . CHOLECYSTECTOMY    . COLONOSCOPY    . COLONOSCOPY [name] 10/30/2012   Procedure: COLONOSCOPY;  Surgeon: [name], MD;  Location: [facility];  Service: Endoscopy;  Laterality: [name];  . CORONARY ANGIOPLASTY WITH STENT PLACEMENT  03/2016; 04/2017   2017-[facility]: BMS to RCA-pt was placed on Brilinta.  04/2017: DES to RCA.  . DORSAL SLIT [name] 10/29/2019   for severe phimosis. Procedure: DORSAL SLIT;  Surgeon: Alexis Frock, MD;  Location: [facility];  Service: Urology;  Laterality: [name];  15 MINS  . EYE SURGERY Bilateral cataract  . HEMORRHOID SURGERY    . INTRAOCULAR LENS INSERTION Bilateral   . KNEE SURGERY Right   . LEFT AND RIGHT HEART CATHETERIZATION WITH CORONARY ANGIOGRAM [name] 12/24/2012   Procedure: LEFT AND RIGHT HEART CATHETERIZATION WITH CORONARY ANGIOGRAM;  Surgeon: [name], MD;  Location: [name] C[facility] LAB;  Service: Cardiovascular;  Laterality: [name];  . LEG SURGERY Bilateral      lenghtening   .  PACEMAKER PLACEMENT  04/13/2016   2nd deg HB after TAVR, pt had DC MDT PPM placed.  [name] SHOULDER ARTHROSCOPY  08/30/2011   Procedure: ARTHROSCOPY SHOULDER;  Surgeon: [name], MD;  Location: [facility];  Service: Orthopedics;  Laterality: Right;  Right Shoulder Arthroscopy, Debridement, and Decompression  . SPINAL CORD STIMULATOR INSERTION [name] 09/10/2015   Procedure: LUMBAR SPINAL CORD STIMULATOR INSERTION;  Surgeon: [name], MD;  Location: [facility];  Service: Neurosurgery;  Laterality: [name];  . TEE WITHOUT CARDIOVERSION [name] 09/16/2019   Procedure: TRANSESOPHAGEAL ECHOCARDIOGRAM (TEE);  Surgeon: Skeet Latch, MD;  Location: [facility];  Service: Cardiovascular;  Laterality: [name];  . TEE WITHOUT CARDIOVERSION [name] 12/02/2019   +vegetation on AVR and pacer lead in RV.  EF 55-60%, normal wall motion.  Valves function normal.  Procedure: TRANSESOPHAGEAL ECHOCARDIOGRAM (TEE);  Surgeon: Geralynn Rile, MD;  Location: [facility];  Service: Cardiovascular;  Laterality: [name];  . 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  R big toe surg due to osteoarth  . TONSILLECTOMY    . traeculectomy Left    eye  . TRANSCATHETER AORTIC VALVE REPLACEMENT, TRANSFEMORAL  04/11/2016  . TRANSESOPHAGEAL ECHOCARDIOGRAM  03/09/2016; 09/2019   [facility]: EF 55-60%, PFO seen with bi-directional shunting, no thrombus in appendage.  09/2019 ->no valvular vegetations. Small patent foramen ovale with predominantly left to right shunting across the interatrial septum.  . TRANSTHORACIC ECHOCARDIOGRAM  01/2015; 01/2016; 05/18/16; 09/18/16, 05/2017, 08/2017   01/2015 No signif change in aortic stenosis (moderate).  01/2016 Severe LVH w/small LV cavity, EF 60-65%, grade I diast dysfxn.  05/2016 (s/p TAVR): EF 50-55%, grd I DD, biopros AV good.  08/2016--EF 50-55%, LV septal motion c/w conduction abnl, grd I DD,mild MS,bioprosth aortic valve well seated, w/trace AR. 05/2017 TTE EF 35%. 08/2017-EF 35%, mod diff  hypokin LV, grd I DD, biopros AV good.   . TRANSTHORACIC ECHOCARDIOGRAM  04/2018; 09/2019   04/2018: EF 40-45%, mod diffuse LV hypokinesis, grd I DD, bioprosth AV well seated, no AS or AR. 09/2019 EF 60-65%, grd I DD, valves fine, including bioprosth AV  . VITRECTOMY     Social History   Occupational History  . Occupation: Engineer, production    Comment: retired  Tobacco Use  . Smoking status: Never Smoker  . Smokeless tobacco: Never Used  Substance and Sexual Activity  . Alcohol use: No    Alcohol/week: 0.0 standard drinks  . Drug use: No    Types: Oxycodone  . Sexual activity: Not Currently

## 2020-01-14 NOTE — Telephone Encounter (Signed)
Can you have him come into clinic at 11 or 11:30 tomorrow.

## 2020-01-15 ENCOUNTER — Other Ambulatory Visit: Payer: Self-pay

## 2020-01-15 ENCOUNTER — Encounter: Payer: Self-pay | Admitting: Infectious Diseases

## 2020-01-15 ENCOUNTER — Ambulatory Visit (INDEPENDENT_AMBULATORY_CARE_PROVIDER_SITE_OTHER): Payer: Medicare Other | Admitting: Infectious Diseases

## 2020-01-15 DIAGNOSIS — R112 Nausea with vomiting, unspecified: Secondary | ICD-10-CM | POA: Diagnosis not present

## 2020-01-15 DIAGNOSIS — T826XXD Infection and inflammatory reaction due to cardiac valve prosthesis, subsequent encounter: Secondary | ICD-10-CM | POA: Diagnosis not present

## 2020-01-15 DIAGNOSIS — R600 Localized edema: Secondary | ICD-10-CM

## 2020-01-15 DIAGNOSIS — I38 Endocarditis, valve unspecified: Secondary | ICD-10-CM

## 2020-01-15 DIAGNOSIS — E669 Obesity, unspecified: Secondary | ICD-10-CM

## 2020-01-15 DIAGNOSIS — L03119 Cellulitis of unspecified part of limb: Secondary | ICD-10-CM

## 2020-01-15 NOTE — Addendum Note (Signed)
Addended by: Kynzli Rease C on: 01/15/2020 02:39 PM   Modules accepted: Orders

## 2020-01-15 NOTE — Assessment & Plan Note (Addendum)
His foot appears to be doing welll.  There is no si/sx of infection.  I explained with his swelling that this wound may take longer than usual to heal.  Will continue his amox I asked him and his wife to let us know if there is worsening- erythema, f/c, heat, d/c.  O/w will see him back in 3 weeks, myself or Dr Tommy Medal

## 2020-01-15 NOTE — Progress Notes (Signed)
   Subjective:    Patient ID: Tim [name], male    DOB: 1940/09/16, 79 y.o.   MRN: [id number]  HPI 79 y.o.malewith AMP sensitive enterococcal bacteremia from complicated urinary tract infection who also has an AICD and a TAVR. He also underwent transmetatarsal amputation by Dr. Sharol Given for severe infection in his left foot. He was initially treated with 2 weeks of IV antibiotics.  His amputation site developed worsening erythema and drainage. He was seen by Dr Sharol Given and placed on doxy however had persistent worsening. He was switched to po linezolid and had improvement.   He had ID f/u and repeat BCx 11-26-19 drawn due to fever and was again found to have Enterococcal bacteremia. (pan-sens).  He was admitted to the hospital 11-27-19 and he was found to have a large vegetations on both the prosthetic valve and his pacemaker wire. He was d/c home on 12-07-19. Repeat BCx done 5-27 were negative.  He is not felt to be a surgical candidate and has been given dual beta-lactam (ceftriaxone/amp) therapy to complete 6 weeks in July 9, with plan to then change to lifelong suppression with amoxicillin 3 times daily.   He was last seen by Dr Duda 7-12 and had local debridement at his wound on his L foot stump, continued compression stockings. Has had some bleeding from his wound since then.   He complains of neuropathy pain, weakness, occasional n/v.  He has a hx of ischemic cardiomyopathy- his BP is better controlled now per pt. He has had f/u with CV and PCP.   Review of Systems  Constitutional: Negative for appetite change, chills and fever.  Respiratory: Negative for cough and shortness of breath.   Cardiovascular: Negative for chest pain.  Gastrointestinal: Positive for nausea and vomiting. Negative for constipation and diarrhea.  Genitourinary: Negative for difficulty urinating.  Skin: Positive for wound.  Neurological: Negative for dizziness.  Please see HPI. All other systems reviewed and  negative.      Objective:   Physical Exam Vitals reviewed.  Constitutional:      General: He is not in acute distress.    Appearance: He is obese. He is not toxic-appearing.  HENT:     Mouth/Throat:     Mouth: Mucous membranes are moist.     Pharynx: No oropharyngeal exudate.  Eyes:     Conjunctiva/sclera: Conjunctivae normal.     Pupils: Pupils are equal, round, and reactive to light.  Cardiovascular:     Rate and Rhythm: Normal rate and regular rhythm.  Pulmonary:     Effort: Pulmonary effort is normal.     Breath sounds: Normal breath sounds.  Abdominal:     General: Bowel sounds are normal. There is no distension.     Palpations: Abdomen is soft.     Tenderness: There is no abdominal tenderness.  Musculoskeletal:        General: Swelling present. Normal range of motion.     Cervical back: Normal range of motion and neck supple.     Right lower leg: Edema present.     Left lower leg: Edema present.       Feet:        Assessment & Plan:

## 2020-01-15 NOTE — Assessment & Plan Note (Signed)
His AICD site is non-tender.  He is now on suppressive anbx Would be worthwhile to repeat his anbx today.

## 2020-01-16 ENCOUNTER — Other Ambulatory Visit: Payer: Self-pay

## 2020-01-16 ENCOUNTER — Ambulatory Visit (HOSPITAL_COMMUNITY): Payer: Medicare Other | Attending: Cardiovascular Disease

## 2020-01-16 DIAGNOSIS — M86272 Subacute osteomyelitis, left ankle and foot: Secondary | ICD-10-CM | POA: Diagnosis present

## 2020-01-16 DIAGNOSIS — Z952 Presence of prosthetic heart valve: Secondary | ICD-10-CM | POA: Diagnosis present

## 2020-01-16 LAB — ECHOCARDIOGRAM COMPLETE
AR max vel: 1.15 cm2
AV Area VTI: 0.99 cm2
AV Area mean vel: 1.16 cm2
AV Mean grad: 12 mmHg
AV Peak grad: 23.2 mmHg
Ao pk vel: 2.41 m/s
Area-P 1/2: 2.45 cm2

## 2020-01-16 MED ORDER — PERFLUTREN LIPID MICROSPHERE
1.0000 mL | INTRAVENOUS | Status: AC | PRN
  Administered 2020-01-16: 3 mL via INTRAVENOUS

## 2020-01-21 LAB — CULTURE, BLOOD (SINGLE)
MICRO NUMBER:: 10711156
MICRO NUMBER:: 10711157
Result:: NO GROWTH
SPECIMEN QUALITY:: ADEQUATE

## 2020-02-02 ENCOUNTER — Ambulatory Visit: Payer: Medicare Other | Admitting: Orthopedic Surgery

## 2020-02-06 ENCOUNTER — Ambulatory Visit (INDEPENDENT_AMBULATORY_CARE_PROVIDER_SITE_OTHER): Payer: Medicare Other | Admitting: Infectious Diseases

## 2020-02-06 ENCOUNTER — Other Ambulatory Visit: Payer: Self-pay

## 2020-02-06 DIAGNOSIS — R600 Localized edema: Secondary | ICD-10-CM

## 2020-02-06 DIAGNOSIS — R7881 Bacteremia: Secondary | ICD-10-CM

## 2020-02-06 DIAGNOSIS — T826XXA Infection and inflammatory reaction due to cardiac valve prosthesis, initial encounter: Secondary | ICD-10-CM

## 2020-02-06 DIAGNOSIS — I38 Endocarditis, valve unspecified: Secondary | ICD-10-CM | POA: Diagnosis not present

## 2020-02-06 DIAGNOSIS — M86272 Subacute osteomyelitis, left ankle and foot: Secondary | ICD-10-CM

## 2020-02-06 DIAGNOSIS — B952 Enterococcus as the cause of diseases classified elsewhere: Secondary | ICD-10-CM

## 2020-02-06 NOTE — Assessment & Plan Note (Signed)
His wound is doing well He will f/u with Dr Sharol Given (he does not go to [facility]) He wants to f/u with Dr Tommy Medal.  2 months f/u.  I recommended he use lotion on his legs to decrease dry skin and promote skin integrity.

## 2020-02-06 NOTE — Assessment & Plan Note (Signed)
He appears to be doing well He is asx and tolerating suppressive therapy well.  Will continue amoxl and regular f/u.

## 2020-02-06 NOTE — Progress Notes (Signed)
° °  Subjective:    Patient ID: Tim [name], male    DOB: [birth year], 79 y.o.   MRN: [id number]  HPI 79 y.o.malewith AMP sensitive enterococcal bacteremia from complicated urinary tract infection who also has an AICD and a TAVR. He also underwent transmetatarsal amputation by Dr. Sharol Given for severe infection in his left foot. He was initially treated with 2 weeks of IV antibiotics.  His amputation site developed worsening erythema and drainage. He was seen by Dr Sharol Given and placed on doxy however had persistent worsening. He was switched to po linezolid and had improvement.   He had ID f/u and repeat BCx 11-26-19 drawn due to fever and was again found to have Enterococcal bacteremia. (pan-sens).  He was admitted to the hospital 80-27-21 and he was found to have a large vegetations on both the prosthetic valve and his pacemaker wire. He was d/c home on 12-07-19. Repeat BCx done 5-27 were negative.  He is not felt to be a surgical candidate and has been given dual beta-lactam (ceftriaxone/amp) therapy to complete 6 weeks in July 9, with plan to then change to lifelong suppression with amoxicillin 3 times daily.   He was last seen by Dr Duda 7-12 and had local debridement at his wound on his L foot stump, continued compression stockings.  He had f/u last week (1st week Aug 2021) and needs to be rescheduled.   He had f/u with [facility] 7-30 and appeared to be doing well.   Today feels that he is doing about the same. He has small amt of bleeding from his wound (he is on eliquis). He does not have gum or nose bleeding.  No f/c.  Is taking amoxil- has no side effects. No rash, no diarrhea.   Has gotten COVID vax.   Review of Systems  Constitutional: Negative for appetite change, chills, fever and unexpected weight change.  Respiratory: Negative for cough and shortness of breath.   Cardiovascular: Positive for leg swelling. Negative for chest pain.  Gastrointestinal: Negative for constipation and  diarrhea.  Genitourinary: Negative for difficulty urinating.       Objective:   Physical Exam Vitals reviewed.  HENT:     Mouth/Throat:     Mouth: Mucous membranes are moist.     Pharynx: No oropharyngeal exudate.  Eyes:     Extraocular Movements: Extraocular movements intact.     Pupils: Pupils are equal, round, and reactive to light.  Cardiovascular:     Rate and Rhythm: Normal rate.     Heart sounds: Murmur heard.   Abdominal:     General: Bowel sounds are normal. There is no distension.     Palpations: Abdomen is soft.     Tenderness: There is no abdominal tenderness.  Musculoskeletal:     Cervical back: Normal range of motion and neck supple.     Right lower leg: Edema present.     Left lower leg: Edema present.  Skin:    General: Skin is dry.  Neurological:     General: No focal deficit present.     His wound is clean, non-tender, no d/c. There is no heat or erythema.      Assessment & Plan:

## 2020-02-06 NOTE — Assessment & Plan Note (Signed)
He has negative BCx from 01-15-20 and 11-2019.  Will defer repeating today

## 2020-02-10 ENCOUNTER — Ambulatory Visit (INDEPENDENT_AMBULATORY_CARE_PROVIDER_SITE_OTHER): Payer: Medicare Other | Admitting: Orthopedic Surgery

## 2020-02-10 ENCOUNTER — Telehealth: Payer: Self-pay

## 2020-02-10 ENCOUNTER — Ambulatory Visit: Payer: Self-pay

## 2020-02-10 ENCOUNTER — Encounter: Payer: Self-pay | Admitting: Orthopedic Surgery

## 2020-02-10 VITALS — Ht 72.0 in | Wt 320.0 lb

## 2020-02-10 DIAGNOSIS — Z89432 Acquired absence of left foot: Secondary | ICD-10-CM | POA: Diagnosis not present

## 2020-02-10 DIAGNOSIS — L97522 Non-pressure chronic ulcer of other part of left foot with fat layer exposed: Secondary | ICD-10-CM

## 2020-02-10 MED ORDER — CEPHALEXIN 500 MG PO CAPS: 500.0000 mg | ORAL_CAPSULE | Freq: Three times a day (TID) | ORAL | 0 refills | Status: DC

## 2020-02-10 NOTE — Telephone Encounter (Signed)
Patient was seen by ortho today for post op transmetatarsal amputation. Incision has reopened.Ortho put patient on keflex 552m TID. Patient wants to ensure that it is ok to take while currently taking amoxicillin 5073mTID for prosthetic valve infection. Routing to provider for advise.  ShEugenia Mcalpine

## 2020-02-10 NOTE — Progress Notes (Signed)
Office Visit Note   Patient: Tim [name]           Date of Birth: 08/30/1940           MRN: [id number] Visit Date: 02/10/2020              Requested by: Tammi Sou, MD [id number] [place],  [place] [address] PCP: Tammi Sou, MD  Chief Complaint  Patient presents with  . Left Foot - Routine Post Op    08/29/19 Left TMA      HPI: Patient is an 79 year old gentleman who presents nearly 6 months status post left transmetatarsal amputation. he is currently using a CircAid wrap for the venous insufficiency.    The patient is status post transmetatarsal amputation February 26.  Unfortunately after the incision had fully healed this opened back up about 5 weeks ago.  Initially just had some bleeding from the ulcerative area has been doing Iodosorb dressing changes.  Today presents with a warm and pink foot.  The patient is a bit concerned for infection.  Is also following with infectious diseases as he is currently on amoxicillin 3 times a day he states that this is a lifetime antibiotic for an infected heart valve and infected pacemaker leads.  Assessment & Plan: Visit Diagnoses:  1. Ulcer of foot, left, with fat layer exposed ([place])   2. History of transmetatarsal amputation of left foot ([place])     Plan: The ulcer was debrided of skin and soft tissue there is no exposed bone.  Radiographs reassuring.  Will provide a 2-week course of Keflex.  Discussed return precautions.  Patient will resume wound care continue with his compression follow-up in 1 weeks  Follow-Up Instructions: Return in about 1 week (around 02/17/2020).   Ortho Exam  [name], oriented, no adenopathy, well-dressed, normal affect, normal respiratory effort. Examination patient has ulcer over the distal mid aspect of the transmetatarsal amputation.  There is no active drainage.  Dried blood.  There is surrounding warmth and mild erythema.  There is no ascending cellulitis.  After informed  consent a 10 blade knife was used to debride the skin and soft tissue back to healthy viable granulation tissue before debridement the callused area was 10 mm in diameter after debridement the wound bed is 10 mm in diameter 5 mm deep this does not extend to bone or tendon no deep abscess silver nitrate was used for hemostasis there was good bleeding granulation tissue.  Imaging: XR Foot 2 Views Left  Result Date: 02/10/2020 Radiographs of left foot negative for acute finding. No sign of osteomyelitis.  No images are attached to the encounter.  Labs: Lab Results  Component Value Date   HGBA1C 5.6 10/02/2019   HGBA1C 6.1 03/27/2019   HGBA1C 6.1 09/16/2018   ESRSEDRATE 20 02/09/2016   ESRSEDRATE 36 (H) 01/28/2014   CRP 2.5 (H) 11/26/2019   LABURIC 3.5 (L) 09/10/2013   REPTSTATUS 12/06/2019 FINAL 12/04/2019   CULT  12/04/2019    NO GROWTH Performed at [facility], [place] [address] [place], [place] [address]    LABORGA ENTEROCOCCUS FAECALIS 09/11/2019     Lab Results  Component Value Date   ALBUMIN 2.5 (L) 11/28/2019   ALBUMIN 2.9 (L) 11/27/2019   ALBUMIN 2.9 (L) 11/26/2019   LABURIC 3.5 (L) 09/10/2013    Lab Results  Component Value Date   MG 1.8 12/03/2019   MG 1.4 (L) 12/02/2019  MG 1.8 09/17/2019   No results found for: VD25OH  No results found for: PREALBUMIN CBC EXTENDED Latest Ref Rng & Units 12/06/2019 12/05/2019 12/04/2019  WBC 4.0 - 10.5 K/uL 6.3 6.0 5.5  RBC 4.22 - 5.81 MIL/uL 3.25(L) 3.24(L) 3.33(L)  HGB 13.0 - 17.0 g/dL 9.4(L) 9.4(L) 9.6(L)  HCT 39 - 52 % 30.5(L) 30.1(L) 30.7(L)  PLT 150 - 400 K/uL 139(L) 134(L) 148(L)  NEUTROABS 1.7 - 7.7 K/uL 4.5 3.9 3.6  LYMPHSABS 0.7 - 4.0 K/uL 0.9 1.3 1.2     Body mass index is 43.4 kg/m.  Orders:  Orders Placed This Encounter  Procedures  . XR Foot 2 Views Left   Meds ordered this encounter  Medications  . cephALEXin (KEFLEX) 500 MG capsule    Sig: Take 1 capsule (500 mg total) by mouth 3 (three) times  daily.    Dispense:  30 capsule    Refill:  0     Procedures: No procedures performed  Clinical Data: No additional findings.  ROS:  All other systems negative, except as noted in the HPI. Review of Systems  Constitutional: Negative for chills and fever.  Skin: Positive for color change and wound.    Objective: Vital Signs: Ht 6' (1.829 m)   Wt (!) 320 lb (145.2 kg)   BMI 43.40 kg/m   Specialty Comments:  No specialty comments available.  PMFS History: Patient Active Problem List   Diagnosis Date Noted  . 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 ICD (implantable cardioverter-defibrillator) infection ([place]) 12/22/2019  . Gram-positive bacteremia 11/27/2019  . FUO (fever of unknown origin) 11/26/2019  . Low blood pressure reading 11/05/2019  . Wound dehiscence 10/22/2019  . PFO (patent foramen ovale) 10/06/2019  . ICD (implantable cardioverter-defibrillator) battery depletion   . Pressure injury of skin 09/13/2019  . Enterococcal bacteremia history  09/13/2019  . 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  . 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 Urinary retention 09/13/2019  . Phimosis 09/13/2019  . SIRS (systemic inflammatory response syndrome) ([place]) 09/12/2019  . Sepsis ([place]) 09/12/2019  . Enterococcus UTI 09/12/2019  . Cellulitis of foot   . History of transmetatarsal amputation of left foot ([place])   . Subacute osteomyelitis, left ankle and foot ([place])   . 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 Diabetic neuropathy, painful ([place]) 12/26/2016  . Neuropathic pain of foot, left 06/20/2016  . 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 Status post amputation of left great toe ([place]) 06/08/2015  . Spinal stenosis of lumbar region 02/16/2015  . Hereditary and idiopathic peripheral neuropathy 01/12/2015  . Oral thrush 12/28/2014  . Orthostatic dizziness  05/24/2014  . Paresthesias with subjective weakness 01/30/2014  . Bilateral thigh pain 01/30/2014  . Abdominal wall pain in right upper quadrant 01/22/2014  . Gross hematuria 09/25/2013  . Intertrigo 09/25/2013  . IBS (irritable bowel syndrome) 09/11/2013  . Chronic pain syndrome 08/06/2013  . CAD (coronary artery disease), native coronary artery 12/25/2012  . Paroxysmal atrial fibrillation ([place]) 12/25/2012  . Moderate aortic stenosis 12/25/2012  . Aortic stenosis 12/24/2012  . Personal history of colonic adenoma 10/30/2012  . Diverticulosis of colon (without mention of hemorrhage) 10/30/2012  . Internal hemorrhoids 10/30/2012  . Change in bowel habits intermittent loose stools 10/30/2012  . Routine health maintenance 06/09/2012  . Hereditary peripheral neuropathy 10/10/2011  . Long term current use of anticoagulant - apixiban 08/16/2010  . Dyspnea on exertion 09/08/2009  .  HYPERCHOLESTEROLEMIA-PURE 01/13/2009  . Essential hypertension, benign 01/13/2009  . EDEMA 01/13/2009  . GOUT 01/12/2009  . GLAUCOMA 01/12/2009  . Venous insufficiency of both lower extremities 01/12/2009  . COPD (chronic obstructive pulmonary disease) ([place]) 01/12/2009  . VENTRAL HERNIA 01/12/2009  . OSTEOARTHRITIS 01/12/2009  . ARTHRITIS 01/12/2009  . DEGENERATIVE DISC DISEASE 01/12/2009  . CATARACT, HX OF 01/12/2009  . HEART MURMUR, HX OF 01/12/2009  . BENIGN PROSTATIC HYPERTROPHY, HX OF 01/12/2009  . Obesity 01/17/2007  . OBSTRUCTIVE SLEEP APNEA 01/17/2007  . ASTHMA 01/17/2007  . GERD 01/17/2007  . HALLUX VALGUS, ACQUIRED 01/17/2007  . Pulmonary hypertension ([place]) 01/17/2007   Past Medical History:  Diagnosis Date  . AICD (automatic cardioverter/defibrillator) present   . ASTHMA   . Asthma    as a child  . Balanitis    +severe phimosis+ buried penis->circ not possible but dorsal slit done 10/2019  . BPH (benign prostatic hypertrophy)    with urinary retention.  Renal u/s 12/04/19 NORMAL KIDNEYS,  NORMAL BLADDER, NO HYDRONEPHROSIS  . CAD (coronary artery disease)    Nonobstructive by 12/2012 cath; then 03/2016 he required BMS to RCA ([facility]).  In-stent restenosis on cath 11/01/16, baloon angioplasty successful--pt to take plavix (no ASA), + eliquis (for PAF).  [name] CATARACT, HX OF   . Chronic combined systolic and diastolic CHF (congestive heart failure) ([place]) 05/2016   Ischemic CM.  04/2018 EF 40-45%, grd I DD.  [name] Chronic pain syndrome    Lumbar DDD; chronic neuropathic pain (DM); has spinal stimulator and sees pain mgmt MD  . Complicated UTI (urinary tract infection) 09/2019   Phimoses, acute urinary retention, entoerococcus UTI, enterococc bacteremia.  F/u blood clx's neg x 5d.    [name] COPD   . Diabetes mellitus type 2 with complications (HCC)    DPO2U jumped from 5.7% to 6.1% 03/2015---started metformin at that time.  DM 2 dx by fasting gluc criteria 2018.  Has chronic neuropath pain  . Elevated transaminase level 02/2016   Suspect due to fatty liver (documented on u/s 2007). Plan repeat labs 03/2016.  [name] Enterococcal bacteremia 09/2019   Phimoses, acute urinary retention, entoerococcus UTI, enterococc bacteremia.  F/u blood clx's neg x 5d.    . Essential hypertension, benign   . Fatty liver 2007   2007 u/s showed fatty liver with hepatosplenomegaly.  2019 repeat u/s->fatty liver but no cirrhosis or hepatosplenomegaly.  . FUO (fever of unknown origin) 11/26/2019  . GERD (gastroesophageal reflux disease)   . Glaucoma   . GOUT   . Hallux valgus (acquired)   . HH (hiatus hernia)   . HYPERCHOLESTEROLEMIA-PURE   . Hypogonadism male   . ICD (implantable cardioverter-defibrillator) infection ([place]) 12/22/2019  . 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 Tommy Medal)  . Lumbosacral neuritis   . Lumbosacral spondylosis    Lumbar spinal stenosis with neurogenic claudication--contributes to his chronic pain  syndrome  . Normal memory function 08/2014   Neuropsychological testing ([facility]): no cognitive impairment or sign of neurodegenerative disorder.  Likely has adjustment d/o with mixed anxiety/depressed features and may benefit from low dose SNRI.    [name] Normocytic anemia 03/2016   Mild-pt needs ferritin and vit B12 level checked (as of 03/22/16). Hb stable 09/2019  . NSTEMI (non-ST elevated myocardial infarction) ([place]) 03/20/2016   BMS to RCA  . Obesity hypoventilation syndrome ([place])   . Obesity, unspecified   . Orthostatic hypotension   .  OSA on CPAP    8 cm H2O  . OSTEOARTHRITIS   . Paroxysmal atrial fibrillation ([place]) 2003    (? chronic?) Off anticoag for a while due to falls.  Then apixaban started 12/2014.  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 Ella Bodo): neuropathic pain->diff to treat, failed nucynta, failed spinal stimul trial, oxycontin hs + tramadol + gabap as of 12/2017 f/u Dr. Letta Pate.  . Personal history of colonic adenoma 10/30/2012   Diminutive adenoma, consider repeat 2019 per GI  . PFO (patent foramen ovale) 09/2019   small, with predominately L to R shunt  . Pneumonia   . Presence of cardiac defibrillator 11/07/2017  . Primary osteoarthritis of both knees    Bone on bone of medial compartments, + signif patellofemoral arth bilat.--supartz inj series started 09/12/17  . 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 PUD (peptic ulcer disease)   . PULMONARY HYPERTENSION, HX OF   . Secondary male hypogonadism 2017  . 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 Shortness of breath    with exertion: much improved s/p TAVR and treatment for CHF.  . Sick sinus syndrome (HCC)    PPM placed  . Thrombocytopenia ([place]) 2018   HSM on 2007 abd u/s---suspect some mild splenic sequestration chronically.  [name] Unspecified glaucoma(365.9)   . Unspecified hereditary  and idiopathic peripheral neuropathy approx age 89   bilat LE's, ? left arm, too.  Feet became progressively numb + left foot pain intermittently.  Pt may be trying a spinal stimulator (as of 05/2015)  . VENOUS INSUFFICIENCY    Being followed by Dr. Sharol Given as of 10/2016 for two R LL venous stasis ulcers/skin tears.  Healed as of 10/30/16 f/u with Dr. Sharol Given.  . VENTRAL HERNIA   . Wound dehiscence 10/22/2019    Family History  Problem Relation Age of Onset  . Hypertension Mother   . Coronary artery disease Mother   . Heart attack Mother   . Neuropathy Mother   . Pulmonary fibrosis Father        asbestosis    Past Surgical History:  Procedure Laterality Date  . AMPUTATION Left 04/11/2013   Procedure: AMPUTATION DIGIT Left 3rd toe;  Surgeon: [name], MD;  Location: [facility];  Service: Orthopedics;  Laterality: Left;  Left 3rd toe amputation at [facility]  . AMPUTATION Left 08/29/2019   Procedure: LEFT TRANSMETATARSAL AMPUTATION;  Surgeon: [name], MD;  Location: [facility];  Service: Orthopedics;  Laterality: Left;  . CARDIAC CATHETERIZATION  1997; 03/10/16   1997 Non-obstructive disease.  03/2016 BMS to RCA, with 25% pDiag dz, o/w normal cors per cath 03/07/16.  Cath 11/01/16: in stent restenosis, successful baloon angioplasty.  [name] CARDIAC CATHETERIZATION  12/24/2012   mild < 20% LCx, prox 30% RCA; LVEF 55-65% , moderate pulmonary HTN, moderate AS  . CARDIAC DEFIBRILLATOR PLACEMENT  11/07/2017   [facility] MRI Quad CRT defibrillator  . CARDIOVASCULAR STRESS TEST  05/11/16 ([facility])   Myocardial perfusion imaging:  No ischemia; scar in apex, global hypokinesis, EF 36%.  . Carotid dopplers  03/09/2016   [facility]: no hemodynamically significant stenosis on either side.  . CHOLECYSTECTOMY    . COLONOSCOPY    . COLONOSCOPY [name] 10/30/2012   Procedure: COLONOSCOPY;  Surgeon: [name], MD;  Location: [facility];  Service: Endoscopy;  Laterality: [name];  . CORONARY ANGIOPLASTY WITH STENT PLACEMENT  03/2016;  04/2017   [id number]: BMS to RCA-pt was placed on Brilinta.  04/2017: DES to RCA.  . DORSAL SLIT [name] 10/29/2019   for severe phimosis. Procedure: DORSAL SLIT;  Surgeon: Alexis Frock, MD;  Location: [facility];  Service: Urology;  Laterality: [name];  58 MINS  . EYE SURGERY Bilateral cataract  . HEMORRHOID SURGERY    . INTRAOCULAR LENS INSERTION Bilateral   . KNEE SURGERY Right   . LEFT AND RIGHT HEART CATHETERIZATION WITH CORONARY ANGIOGRAM [name] 12/24/2012   Procedure: LEFT AND RIGHT HEART CATHETERIZATION WITH CORONARY ANGIOGRAM;  Surgeon: [name], MD;  Location[facility];  Service: Cardiovascular;  Laterality: [name];  . LEG SURGERY Bilateral    lenghtening   . PACEMAKER PLACEMENT  04/13/2016   2nd deg HB after TAVR, pt had DC MDT PPM placed.  [name] SHOULDER ARTHROSCOPY  08/30/2011   Procedure: ARTHROSCOPY SHOULDER;  Surgeon: [name], MD;  Location: [facility];  Service: Orthopedics;  Laterality: Right;  Right Shoulder Arthroscopy, Debridement, and Decompression  . SPINAL CORD STIMULATOR INSERTION [name] 09/10/2015   Procedure: LUMBAR SPINAL CORD STIMULATOR INSERTION;  Surgeon: [name], MD;  Location: [facility];  Service: Neurosurgery;  Laterality: [name];  . TEE WITHOUT CARDIOVERSION [name] 09/16/2019   Procedure: TRANSESOPHAGEAL ECHOCARDIOGRAM (TEE);  Surgeon: Skeet Latch, MD;  Location: [facility];  Service: Cardiovascular;  Laterality: [name];  . TEE WITHOUT CARDIOVERSION [name] 12/02/2019   +vegetation on AVR and pacer lead in RV.  EF 55-60%, normal wall motion.  Valves function normal.  Procedure: TRANSESOPHAGEAL ECHOCARDIOGRAM (TEE);  Surgeon: Geralynn Rile, MD;  Location: [facility];  Service: Cardiovascular;  Laterality: [name];  . 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  R big toe surg due to osteoarth  . TONSILLECTOMY    . traeculectomy Left    eye  . TRANSCATHETER AORTIC VALVE REPLACEMENT, TRANSFEMORAL  04/11/2016  . TRANSESOPHAGEAL ECHOCARDIOGRAM  03/09/2016; 09/2019    [facility]: EF 55-60%, PFO seen with bi-directional shunting, no thrombus in appendage.  09/2019 ->no valvular vegetations. Small patent foramen ovale with predominantly left to right shunting across the interatrial septum.  . TRANSTHORACIC ECHOCARDIOGRAM  01/2015; 01/2016; 05/18/16; 09/18/16, 05/2017, 08/2017   01/2015 No signif change in aortic stenosis (moderate).  01/2016 Severe LVH w/small LV cavity, EF 60-65%, grade I diast dysfxn.  05/2016 (s/p TAVR): EF 50-55%, grd I DD, biopros AV good.  08/2016--EF 50-55%, LV septal motion c/w conduction abnl, grd I DD,mild MS,bioprosth aortic valve well seated, w/trace AR. 05/2017 TTE EF 35%. 08/2017-EF 35%, mod diff hypokin LV, grd I DD, biopros AV good.   . TRANSTHORACIC ECHOCARDIOGRAM  04/2018; 09/2019   04/2018: EF 40-45%, mod diffuse LV hypokinesis, grd I DD, bioprosth AV well seated, no AS or AR. 09/2019 EF 60-65%, grd I DD, valves fine, including bioprosth AV  . VITRECTOMY     Social History   Occupational History  . Occupation: Engineer, production    Comment: retired  Tobacco Use  . Smoking status: Never Smoker  . Smokeless tobacco: Never Used  Substance and Sexual Activity  . Alcohol use: No    Alcohol/week: 0.0 standard drinks  . Drug use: No    Types: Oxycodone  . Sexual activity: Not Currently

## 2020-02-12 NOTE — Telephone Encounter (Signed)
Patient left VM in triage following up from previous routed message to MD.  Will route to MD for advise. [name]

## 2020-02-13 ENCOUNTER — Other Ambulatory Visit: Payer: Self-pay | Admitting: Family Medicine

## 2020-02-13 NOTE — Telephone Encounter (Signed)
RF request for WIXELA [id number] INHUB LOV:12/16/19 Next ov: n/a Last written: n/a  Please advise if refill is appropriate. Medication not on med list. Attempted to call pt to find out if currently taking, waiting on return call.

## 2020-02-16 NOTE — Telephone Encounter (Signed)
Ok to take, can you find out how long the keflex rx is for? Wouldn't do this for more than 2 weeks thanks

## 2020-02-17 ENCOUNTER — Ambulatory Visit: Payer: Medicare Other | Admitting: Family

## 2020-02-17 NOTE — Telephone Encounter (Signed)
Ok, wixela RFd

## 2020-02-18 NOTE — Telephone Encounter (Signed)
Was able to reach patient and advise per Dr. Johnnye Sima he can take both Keflex and Amoxicillin together. Patient confirmed that he has not started the Keflex and advise to only take for 2 weeks. Patient verbalized understanding.  [name]

## 2020-02-20 ENCOUNTER — Ambulatory Visit: Payer: Medicare Other | Admitting: Physician Assistant

## 2020-02-23 ENCOUNTER — Encounter: Payer: Self-pay | Admitting: Orthopedic Surgery

## 2020-02-23 ENCOUNTER — Ambulatory Visit: Payer: Medicare Other | Admitting: Infectious Disease

## 2020-02-23 ENCOUNTER — Ambulatory Visit (INDEPENDENT_AMBULATORY_CARE_PROVIDER_SITE_OTHER): Payer: Medicare Other | Admitting: Orthopedic Surgery

## 2020-02-23 VITALS — Ht 72.0 in | Wt 320.0 lb

## 2020-02-23 DIAGNOSIS — L97522 Non-pressure chronic ulcer of other part of left foot with fat layer exposed: Secondary | ICD-10-CM

## 2020-02-23 DIAGNOSIS — Z89432 Acquired absence of left foot: Secondary | ICD-10-CM | POA: Diagnosis not present

## 2020-02-23 NOTE — Progress Notes (Signed)
Office Visit Note   Patient: Tim [name]           Date of Birth: Feb 27, 1941           MRN: [id number] Visit Date: 02/23/2020              Requested by: Tammi Sou, MD [id number] [place],  [place] [id number] PCP: Tammi Sou, MD  Chief Complaint  [name]      HPI: Patient is an 79 year old gentleman who presents with ulceration over the end of the transmetatarsal amputation the left he is wearing noncompressible stockings bilateral lower extremities for his venous insufficiency.  Patient is completing a course of Keflex that was approved by infectious disease.  Assessment & Plan: Visit Diagnoses:  1. History of transmetatarsal amputation of left foot ([place])   2. Ulcer of foot, left, with fat layer exposed ([place])     Plan: Do not see an indication of infection.  Recommend he complete the course of Keflex.  Continue with daily dressing changes.  Follow-Up Instructions: Return in about 4 weeks (around 03/22/2020).   Ortho Exam  [name], oriented, no adenopathy, well-dressed, normal affect, normal respiratory effort. Examination patient has venous swelling in the left lower extremity there is redness in the foot that appears to be more due to swelling compression across the foot does not reproduce any pain there is no odor no drainage.  The ulcerative area is about 5 mm in diameter.  After informed consent a 10 blade knife was used to debride the skin and soft tissue back to healthy viable granulation tissue the ulcer was 10 mm in diameter and 3 mm deep after debridement there was healthy granulation tissue no exposed bone or tendon.  Imaging: No results found. No images are attached to the encounter.  Labs: Lab Results  Component Value Date   HGBA1C 5.6 10/02/2019   HGBA1C 6.1 03/27/2019   HGBA1C 6.1 09/16/2018   ESRSEDRATE 20 02/09/2016   ESRSEDRATE 36 (H) 01/28/2014   CRP 2.5 (H) 11/26/2019   LABURIC  3.5 (L) 09/10/2013   REPTSTATUS 12/06/2019 FINAL 12/04/2019   CULT  12/04/2019    NO GROWTH Performed at [facility], [place] [address], [place], [place] [address]    LABORGA ENTEROCOCCUS FAECALIS 09/11/2019     Lab Results  Component Value Date   ALBUMIN 2.5 (L) 11/28/2019   ALBUMIN 2.9 (L) 11/27/2019   ALBUMIN 2.9 (L) 11/26/2019   LABURIC 3.5 (L) 09/10/2013    Lab Results  Component Value Date   MG 1.8 12/03/2019   MG 1.4 (L) 12/02/2019   MG 1.8 09/17/2019   No results found for: VD25OH  No results found for: PREALBUMIN CBC EXTENDED Latest Ref Rng & Units 12/06/2019 12/05/2019 12/04/2019  WBC 4.0 - 10.5 K/uL 6.3 6.0 5.5  RBC 4.22 - 5.81 MIL/uL 3.25(L) 3.24(L) 3.33(L)  HGB 13.0 - 17.0 g/dL 9.4(L) 9.4(L) 9.6(L)  HCT 39 - 52 % 30.5(L) 30.1(L) 30.7(L)  PLT 150 - 400 K/uL 139(L) 134(L) 148(L)  NEUTROABS 1.7 - 7.7 K/uL 4.5 3.9 3.6  LYMPHSABS 0.7 - 4.0 K/uL 0.9 1.3 1.2     Body mass index is 43.4 kg/m.  Orders:  No orders of the defined types were placed in this encounter.  No orders of the defined types were placed in this encounter.    Procedures: No procedures performed  Clinical Data: No additional  findings.  ROS:  All other systems negative, except as noted in the HPI. Review of Systems  Objective: Vital Signs: Ht 6' (1.829 m)   Wt (!) 320 lb (145.2 kg)   BMI 43.40 kg/m   Specialty Comments:  No specialty comments available.  PMFS History: Patient Active Problem List   Diagnosis Date Noted  . 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 ICD (implantable cardioverter-defibrillator) infection ([place]) 12/22/2019  . Gram-positive bacteremia 11/27/2019  . FUO (fever of unknown origin) 11/26/2019  . Low blood pressure reading 11/05/2019  . Wound dehiscence 10/22/2019  . PFO (patent foramen ovale) 10/06/2019  . ICD (implantable cardioverter-defibrillator) battery depletion   . Pressure injury of skin 09/13/2019  . Enterococcal bacteremia  history  09/13/2019  . 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  . 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 Urinary retention 09/13/2019  . Phimosis 09/13/2019  . SIRS (systemic inflammatory response syndrome) ([place]) 09/12/2019  . Sepsis ([place]) 09/12/2019  . Enterococcus UTI 09/12/2019  . Cellulitis of foot   . History of transmetatarsal amputation of left foot ([place])   . Subacute osteomyelitis, left ankle and foot ([place])   . 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 Diabetic neuropathy, painful ([place]) 12/26/2016  . Neuropathic pain of foot, left 06/20/2016  . 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 Status post amputation of left great toe ([place]) 06/08/2015  . Spinal stenosis of lumbar region 02/16/2015  . Hereditary and idiopathic peripheral neuropathy 01/12/2015  . Oral thrush 12/28/2014  . Orthostatic dizziness 05/24/2014  . Paresthesias with subjective weakness 01/30/2014  . Bilateral thigh pain 01/30/2014  . Abdominal wall pain in right upper quadrant 01/22/2014  . Gross hematuria 09/25/2013  . Intertrigo 09/25/2013  . IBS (irritable bowel syndrome) 09/11/2013  . Chronic pain syndrome 08/06/2013  . CAD (coronary artery disease), native coronary artery 12/25/2012  . Paroxysmal atrial fibrillation ([place]) 12/25/2012  . Moderate aortic stenosis 12/25/2012  . Aortic stenosis 12/24/2012  . Personal history of colonic adenoma 10/30/2012  . Diverticulosis of colon (without mention of hemorrhage) 10/30/2012  . Internal hemorrhoids 10/30/2012  . Change in bowel habits intermittent loose stools 10/30/2012  . Routine health maintenance 06/09/2012  . Hereditary peripheral neuropathy 10/10/2011  . Long term current use of anticoagulant - apixiban 08/16/2010  . Dyspnea on exertion 09/08/2009  . HYPERCHOLESTEROLEMIA-PURE 01/13/2009  . Essential hypertension, benign 01/13/2009  . EDEMA  01/13/2009  . GOUT 01/12/2009  . GLAUCOMA 01/12/2009  . Venous insufficiency of both lower extremities 01/12/2009  . COPD (chronic obstructive pulmonary disease) ([place]) 01/12/2009  . VENTRAL HERNIA 01/12/2009  . OSTEOARTHRITIS 01/12/2009  . ARTHRITIS 01/12/2009  . DEGENERATIVE DISC DISEASE 01/12/2009  . CATARACT, HX OF 01/12/2009  . HEART MURMUR, HX OF 01/12/2009  . BENIGN PROSTATIC HYPERTROPHY, HX OF 01/12/2009  . Obesity 01/17/2007  . OBSTRUCTIVE SLEEP APNEA 01/17/2007  . ASTHMA 01/17/2007  . GERD 01/17/2007  . HALLUX VALGUS, ACQUIRED 01/17/2007  . Pulmonary hypertension ([place]) 01/17/2007   Past Medical History:  Diagnosis Date  . AICD (automatic cardioverter/defibrillator) present   . ASTHMA   . Asthma    as a child  . Balanitis    +severe phimosis+ buried penis->circ not possible but dorsal slit done 10/2019  . BPH (benign prostatic hypertrophy)    with urinary retention.  Renal u/s 12/04/19 NORMAL KIDNEYS, NORMAL BLADDER, NO HYDRONEPHROSIS  . CAD (coronary artery disease)    Nonobstructive by 12/2012 cath;  then 03/2016 he required BMS to RCA ([facility]).  In-stent restenosis on cath 11/01/16, baloon angioplasty successful--pt to take plavix (no ASA), + eliquis (for PAF).  [name] CATARACT, HX OF   . Chronic combined systolic and diastolic CHF (congestive heart failure) ([place]) 05/2016   Ischemic CM.  04/2018 EF 40-45%, grd I DD.  [name] Chronic pain syndrome    Lumbar DDD; chronic neuropathic pain (DM); has spinal stimulator and sees pain mgmt MD  . Complicated UTI (urinary tract infection) 09/2019   Phimoses, acute urinary retention, entoerococcus UTI, enterococc bacteremia.  F/u blood clx's neg x 5d.    [name] COPD   . Diabetes mellitus type 2 with complications (HCC)    KGM0N jumped from 5.7% to 6.1% 03/2015---started metformin at that time.  DM 2 dx by fasting gluc criteria 2018.  Has chronic neuropath pain  . Elevated transaminase level 02/2016   Suspect due to fatty liver (documented on u/s  2007). Plan repeat labs 03/2016.  [name] Enterococcal bacteremia 09/2019   Phimoses, acute urinary retention, entoerococcus UTI, enterococc bacteremia.  F/u blood clx's neg x 5d.    . Essential hypertension, benign   . Fatty liver 2007   2007 u/s showed fatty liver with hepatosplenomegaly.  2019 repeat u/s->fatty liver but no cirrhosis or hepatosplenomegaly.  . FUO (fever of unknown origin) 11/26/2019  . GERD (gastroesophageal reflux disease)   . Glaucoma   . GOUT   . Hallux valgus (acquired)   . HH (hiatus hernia)   . HYPERCHOLESTEROLEMIA-PURE   . Hypogonadism male   . ICD (implantable cardioverter-defibrillator) infection ([place]) 12/22/2019  . 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 Tommy Medal)  . Lumbosacral neuritis   . Lumbosacral spondylosis    Lumbar spinal stenosis with neurogenic claudication--contributes to his chronic pain syndrome  . Normal memory function 08/2014   Neuropsychological testing ([facility]): no cognitive impairment or sign of neurodegenerative disorder.  Likely has adjustment d/o with mixed anxiety/depressed features and may benefit from low dose SNRI.    [name] Normocytic anemia 03/2016   Mild-pt needs ferritin and vit B12 level checked (as of 03/22/16). Hb stable 09/2019  . NSTEMI (non-ST elevated myocardial infarction) ([place]) 03/20/2016   BMS to RCA  . Obesity hypoventilation syndrome ([place])   . Obesity, unspecified   . Orthostatic hypotension   . OSA on CPAP    8 cm H2O  . OSTEOARTHRITIS   . Paroxysmal atrial fibrillation ([place]) 2003    (? chronic?) Off anticoag for a while due to falls.  Then apixaban started 12/2014.  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 Ella Bodo): neuropathic pain->diff to treat, failed nucynta, failed spinal stimul trial, oxycontin hs + tramadol + gabap as of 12/2017 f/u Dr. Letta Pate.  . Personal history  of colonic adenoma 10/30/2012   Diminutive adenoma, consider repeat 2019 per GI  . PFO (patent foramen ovale) 09/2019   small, with predominately L to R shunt  . Pneumonia   . Presence of cardiac defibrillator 11/07/2017  . Primary osteoarthritis of both knees    Bone on bone of medial compartments, + signif patellofemoral arth bilat.--supartz inj series started 09/12/17  . 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 PUD (peptic ulcer disease)   . PULMONARY HYPERTENSION, HX OF   . Secondary male hypogonadism 2017  . Severe aortic stenosis    by cath by [facility] 03/2016; CT surgery  saw him and did TAVR 04/11/16 ([facility])  . Shortness of breath    with exertion: much improved s/p TAVR and treatment for CHF.  . Sick sinus syndrome (HCC)    PPM placed  . Thrombocytopenia ([place]) 2018   HSM on 2007 abd u/s---suspect some mild splenic sequestration chronically.  [name] Unspecified glaucoma(365.9)   . Unspecified hereditary and idiopathic peripheral neuropathy approx age 42   bilat LE's, ? left arm, too.  Feet became progressively numb + left foot pain intermittently.  Pt may be trying a spinal stimulator (as of 05/2015)  . VENOUS INSUFFICIENCY    Being followed by Dr. Sharol Given as of 10/2016 for two R LL venous stasis ulcers/skin tears.  Healed as of 10/30/16 f/u with Dr. Sharol Given.  . VENTRAL HERNIA   . Wound dehiscence 10/22/2019    Family History  Problem Relation Age of Onset  . Hypertension Mother   . Coronary artery disease Mother   . Heart attack Mother   . Neuropathy Mother   . Pulmonary fibrosis Father        asbestosis    Past Surgical History:  Procedure Laterality Date  . AMPUTATION Left 04/11/2013   Procedure: AMPUTATION DIGIT Left 3rd toe;  Surgeon: [name], MD;  Location: [facility];  Service: Orthopedics;  Laterality: Left;  Left 3rd toe amputation at [facility]  . AMPUTATION Left 08/29/2019   Procedure: LEFT TRANSMETATARSAL AMPUTATION;  Surgeon: [name], MD;  Location: [facility];   Service: Orthopedics;  Laterality: Left;  . CARDIAC CATHETERIZATION  1997; 03/10/16   1997 Non-obstructive disease.  03/2016 BMS to RCA, with 25% pDiag dz, o/w normal cors per cath 03/07/16.  Cath 11/01/16: in stent restenosis, successful baloon angioplasty.  [name] CARDIAC CATHETERIZATION  12/24/2012   mild < 20% LCx, prox 30% RCA; LVEF 55-65% , moderate pulmonary HTN, moderate AS  . CARDIAC DEFIBRILLATOR PLACEMENT  11/07/2017   [facility] MRI Quad CRT defibrillator  . CARDIOVASCULAR STRESS TEST  05/11/16 ([facility])   Myocardial perfusion imaging:  No ischemia; scar in apex, global hypokinesis, EF 36%.  . Carotid dopplers  03/09/2016   [facility]: no hemodynamically significant stenosis on either side.  . CHOLECYSTECTOMY    . COLONOSCOPY    . COLONOSCOPY [name] 10/30/2012   Procedure: COLONOSCOPY;  Surgeon: [name], MD;  Location: [facility];  Service: Endoscopy;  Laterality: [name];  . CORONARY ANGIOPLASTY WITH STENT PLACEMENT  03/2016; 04/2017   2017-[facility]: BMS to RCA-pt was placed on Brilinta.  04/2017: DES to RCA.  . DORSAL SLIT [name] 10/29/2019   for severe phimosis. Procedure: DORSAL SLIT;  Surgeon: Alexis Frock, MD;  Location: [facility];  Service: Urology;  Laterality: [name];  25 MINS  . EYE SURGERY Bilateral cataract  . HEMORRHOID SURGERY    . INTRAOCULAR LENS INSERTION Bilateral   . KNEE SURGERY Right   . LEFT AND RIGHT HEART CATHETERIZATION WITH CORONARY ANGIOGRAM [name] 12/24/2012   Procedure: LEFT AND RIGHT HEART CATHETERIZATION WITH CORONARY ANGIOGRAM;  Surgeon: [name], MD;  Location: [facility];  Service: Cardiovascular;  Laterality: [name];  . LEG SURGERY Bilateral    lenghtening   . PACEMAKER PLACEMENT  04/13/2016   2nd deg HB after TAVR, pt had DC MDT PPM placed.  [name] SHOULDER ARTHROSCOPY  08/30/2011   Procedure: ARTHROSCOPY SHOULDER;  Surgeon: [name], MD;  Location: [facility];  Service: Orthopedics;  Laterality: Right;  Right Shoulder Arthroscopy, Debridement, and Decompression  . SPINAL  CORD STIMULATOR INSERTION  [name] 09/10/2015   Procedure: LUMBAR SPINAL CORD STIMULATOR INSERTION;  Surgeon: [name], MD;  Location: [facility];  Service: Neurosurgery;  Laterality: [name];  . TEE WITHOUT CARDIOVERSION [name] 09/16/2019   Procedure: TRANSESOPHAGEAL ECHOCARDIOGRAM (TEE);  Surgeon: Skeet Latch, MD;  Location: [facility];  Service: Cardiovascular;  Laterality: [name];  . TEE WITHOUT CARDIOVERSION [name] 12/02/2019   +vegetation on AVR and pacer lead in RV.  EF 55-60%, normal wall motion.  Valves function normal.  Procedure: TRANSESOPHAGEAL ECHOCARDIOGRAM (TEE);  Surgeon: Geralynn Rile, MD;  Location: [facility];  Service: Cardiovascular;  Laterality: [name];  . 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  R big toe surg due to osteoarth  . TONSILLECTOMY    . traeculectomy Left    eye  . TRANSCATHETER AORTIC VALVE REPLACEMENT, TRANSFEMORAL  04/11/2016  . TRANSESOPHAGEAL ECHOCARDIOGRAM  03/09/2016; 09/2019   [facility]: EF 55-60%, PFO seen with bi-directional shunting, no thrombus in appendage.  09/2019 ->no valvular vegetations. Small patent foramen ovale with predominantly left to right shunting across the interatrial septum.  . TRANSTHORACIC ECHOCARDIOGRAM  01/2015; 01/2016; 05/18/16; 09/18/16, 05/2017, 08/2017   01/2015 No signif change in aortic stenosis (moderate).  01/2016 Severe LVH w/small LV cavity, EF 60-65%, grade I diast dysfxn.  05/2016 (s/p TAVR): EF 50-55%, grd I DD, biopros AV good.  08/2016--EF 50-55%, LV septal motion c/w conduction abnl, grd I DD,mild MS,bioprosth aortic valve well seated, w/trace AR. 05/2017 TTE EF 35%. 08/2017-EF 35%, mod diff hypokin LV, grd I DD, biopros AV good.   . TRANSTHORACIC ECHOCARDIOGRAM  04/2018; 09/2019   04/2018: EF 40-45%, mod diffuse LV hypokinesis, grd I DD, bioprosth AV well seated, no AS or AR. 09/2019 EF 60-65%, grd I DD, valves fine, including bioprosth AV  . VITRECTOMY     Social History   Occupational History  . Occupation:  Engineer, production    Comment: retired  Tobacco Use  . Smoking status: Never Smoker  . Smokeless tobacco: Never Used  Substance and Sexual Activity  . Alcohol use: No    Alcohol/week: 0.0 standard drinks  . Drug use: No    Types: Oxycodone  . Sexual activity: Not Currently

## 2020-03-15 ENCOUNTER — Other Ambulatory Visit: Payer: Self-pay

## 2020-03-15 ENCOUNTER — Ambulatory Visit (INDEPENDENT_AMBULATORY_CARE_PROVIDER_SITE_OTHER): Payer: Medicare Other | Admitting: Infectious Disease

## 2020-03-15 ENCOUNTER — Encounter: Payer: Self-pay | Admitting: Infectious Disease

## 2020-03-15 VITALS — BP 120/74 | HR 71 | Temp 98.1°F

## 2020-03-15 DIAGNOSIS — J418 Mixed simple and mucopurulent chronic bronchitis: Secondary | ICD-10-CM

## 2020-03-15 DIAGNOSIS — T826XXD Infection and inflammatory reaction due to cardiac valve prosthesis, subsequent encounter: Secondary | ICD-10-CM

## 2020-03-15 DIAGNOSIS — I38 Endocarditis, valve unspecified: Secondary | ICD-10-CM

## 2020-03-15 DIAGNOSIS — M86272 Subacute osteomyelitis, left ankle and foot: Secondary | ICD-10-CM | POA: Diagnosis not present

## 2020-03-15 DIAGNOSIS — R590 Localized enlarged lymph nodes: Secondary | ICD-10-CM | POA: Insufficient documentation

## 2020-03-15 DIAGNOSIS — Z952 Presence of prosthetic heart valve: Secondary | ICD-10-CM | POA: Diagnosis not present

## 2020-03-15 DIAGNOSIS — T827XXD Infection and inflammatory reaction due to other cardiac and vascular devices, implants and grafts, subsequent encounter: Secondary | ICD-10-CM

## 2020-03-15 DIAGNOSIS — Z95810 Presence of automatic (implantable) cardiac defibrillator: Secondary | ICD-10-CM

## 2020-03-15 DIAGNOSIS — G4733 Obstructive sleep apnea (adult) (pediatric): Secondary | ICD-10-CM

## 2020-03-15 NOTE — Progress Notes (Signed)
Chief complaint: Follow-up for prosthetic valve endocarditis, diabetic foot infection and also a new painful lesion under his jaw Subjective:    Patient ID: Tim [name], male    DOB: 08-27-1940, 79 y.o.   MRN: [id number]  HPI  79 y.o. male with  AMP sensitive enterococcal bacteremia from complicated urinary tract infection who also has an AICD and a TAVR.  Transthoracic echocardiogram and transesophageal echocardiogram were at that time both fortunately without evidence of infection of valves or device.He also recently underwent transmetatarsal amputation by Dr. Sharol Given for severe infection in his left foot.  We treated with 2 weeks of IV antibiotics.  He had been following with Dr. Sharol Given for his amputation site which is developed worsening erythema and drainage.  Dr. Sharol Given placed him on doxycycline but despite the doxycycline the patient has had systemic symptoms of malaise and fever to 101 degrees.  Areas had become more erythematous in the interim.  I had drawn blood cultures and labs today and change him from doxycycline to Zyvox.  He saw Janene Madeira in followup on the 5th of May. He  Reported significant imporvement in erythema  In  His foot in  24  Hours after starting zyvox. He completed zyvox prior to  Visit with Colletta Maryland.  He was continuing to have problems with dizziness and tunnel vision.    He had been complaining on and off of fevers to 102 to 103  Every other day. These came on shortly after stopping his zyvox.  He  had worsening dizziness with standing despite stopping his metoprolol and his diuretic and taking half of an imdur.  When I saw him in the clinic I ordered blood cultures which came back positive for Enterococcus.  We had admitted to the hospital and he was found to have a large vegetations on both the prosthetic valve and his pacemaker wire.  He is not felt to be a surgical candidate and therefore has been given dual beta-lactam therapy to complete 6  weeks in July which followed with lifelong suppression with amoxicillin 3 times daily.  Had no evidence of recurrence his weight is coming back on.  He has had area on his foot debrided by Dr. Sharol Given.  He completed a course of Keflex for this.  He also has an area under his jaw that became noticeable by him in the last few days and which is tender to palpation.  Examining his oropharynx I do not see an obvious odontogenic infection.       Past Medical History:  Diagnosis Date  . AICD (automatic cardioverter/defibrillator) present   . ASTHMA   . Asthma    as a child  . Balanitis    +severe phimosis+ buried penis->circ not possible but dorsal slit done 10/2019  . BPH (benign prostatic hypertrophy)    with urinary retention.  Renal u/s 12/04/19 NORMAL KIDNEYS, NORMAL BLADDER, NO HYDRONEPHROSIS  . CAD (coronary artery disease)    Nonobstructive by 12/2012 cath; then 03/2016 he required BMS to RCA ([facility]).  In-stent restenosis on cath 11/01/16, baloon angioplasty successful--pt to take plavix (no ASA), + eliquis (for PAF).  [name] CATARACT, HX OF   . Chronic combined systolic and diastolic CHF (congestive heart failure) ([place]) 05/2016   Ischemic CM.  04/2018 EF 40-45%, grd I DD.  [name] Chronic pain syndrome    Lumbar DDD; chronic neuropathic pain (DM); has spinal stimulator and sees pain mgmt MD  . Complicated UTI (urinary tract infection) 09/2019   Phimoses,  acute urinary retention, entoerococcus UTI, enterococc bacteremia.  F/u blood clx's neg x 5d.    [name] COPD   . Diabetes mellitus type 2 with complications (HCC)    FEO7H jumped from 5.7% to 6.1% 03/2015---started metformin at that time.  DM 2 dx by fasting gluc criteria 2018.  Has chronic neuropath pain  . Elevated transaminase level 02/2016   Suspect due to fatty liver (documented on u/s 2007). Plan repeat labs 03/2016.  [name] Enterococcal bacteremia 09/2019   Phimoses, acute urinary retention, entoerococcus UTI, enterococc bacteremia.  F/u blood  clx's neg x 5d.    . Essential hypertension, benign   . Fatty liver 2007   2007 u/s showed fatty liver with hepatosplenomegaly.  2019 repeat u/s->fatty liver but no cirrhosis or hepatosplenomegaly.  . FUO (fever of unknown origin) 11/26/2019  . GERD (gastroesophageal reflux disease)   . Glaucoma   . GOUT   . Hallux valgus (acquired)   . HH (hiatus hernia)   . HYPERCHOLESTEROLEMIA-PURE   . Hypogonadism male   . ICD (implantable cardioverter-defibrillator) infection ([place]) 12/22/2019  . 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 Tommy Medal)  . Lumbosacral neuritis   . Lumbosacral spondylosis    Lumbar spinal stenosis with neurogenic claudication--contributes to his chronic pain syndrome  . Normal memory function 08/2014   Neuropsychological testing ([facility]): no cognitive impairment or sign of neurodegenerative disorder.  Likely has adjustment d/o with mixed anxiety/depressed features and may benefit from low dose SNRI.    [name] Normocytic anemia 03/2016   Mild-pt needs ferritin and vit B12 level checked (as of 03/22/16). Hb stable 09/2019  . NSTEMI (non-ST elevated myocardial infarction) ([place]) 03/20/2016   BMS to RCA  . Obesity hypoventilation syndrome ([place])   . Obesity, unspecified   . Orthostatic hypotension   . OSA on CPAP    8 cm H2O  . OSTEOARTHRITIS   . Paroxysmal atrial fibrillation ([place]) 2003    (? chronic?) Off anticoag for a while due to falls.  Then apixaban started 12/2014.  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 Ella Bodo): neuropathic pain->diff to treat, failed nucynta, failed spinal stimul trial, oxycontin hs + tramadol + gabap as of 12/2017 f/u Dr. Letta Pate.  . Personal history of colonic adenoma 10/30/2012   Diminutive adenoma, consider repeat 2019 per GI  . PFO (patent foramen ovale) 09/2019   small, with predominately L to R  shunt  . Pneumonia   . Presence of cardiac defibrillator 11/07/2017  . Primary osteoarthritis of both knees    Bone on bone of medial compartments, + signif patellofemoral arth bilat.--supartz inj series started 09/12/17  . 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 PUD (peptic ulcer disease)   . PULMONARY HYPERTENSION, HX OF   . Secondary male hypogonadism 2017  . 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 Shortness of breath    with exertion: much improved s/p TAVR and treatment for CHF.  . Sick sinus syndrome (HCC)    PPM placed  . Thrombocytopenia ([place]) 2018   HSM on 2007 abd u/s---suspect some mild splenic sequestration chronically.  [name] Unspecified glaucoma(365.9)   . Unspecified hereditary and idiopathic peripheral neuropathy approx age 73   bilat LE's, ? left arm, too.  Feet became progressively numb + left foot pain intermittently.  Pt  may be trying a spinal stimulator (as of 05/2015)  . VENOUS INSUFFICIENCY    Being followed by Dr. Sharol Given as of 10/2016 for two R LL venous stasis ulcers/skin tears.  Healed as of 10/30/16 f/u with Dr. Sharol Given.  . VENTRAL HERNIA   . Wound dehiscence 10/22/2019    Past Surgical History:  Procedure Laterality Date  . AMPUTATION Left 04/11/2013   Procedure: AMPUTATION DIGIT Left 3rd toe;  Surgeon: [name], MD;  Location: [place];  Service: Orthopedics;  Laterality: Left;  Left 3rd toe amputation at [facility]  . AMPUTATION Left 08/29/2019   Procedure: LEFT TRANSMETATARSAL AMPUTATION;  Surgeon: [name], MD;  Location: [place];  Service: Orthopedics;  Laterality: Left;  . CARDIAC CATHETERIZATION  1997; 03/10/16   1997 Non-obstructive disease.  03/2016 BMS to RCA, with 25% pDiag dz, o/w normal cors per cath 03/07/16.  Cath 11/01/16: in stent restenosis, successful baloon angioplasty.  [name] CARDIAC CATHETERIZATION  12/24/2012   mild < 20% LCx, prox 30% RCA; LVEF 55-65% , moderate pulmonary HTN,  moderate AS  . CARDIAC DEFIBRILLATOR PLACEMENT  11/07/2017   [facility] MRI Quad CRT defibrillator  . CARDIOVASCULAR STRESS TEST  05/11/16 ([facility])   Myocardial perfusion imaging:  No ischemia; scar in apex, global hypokinesis, EF 36%.  . Carotid dopplers  03/09/2016   [facility]: no hemodynamically significant stenosis on either side.  . CHOLECYSTECTOMY    . COLONOSCOPY    . COLONOSCOPY [name] 10/30/2012   Procedure: COLONOSCOPY;  Surgeon: [name], MD;  Location: [facility];  Service: Endoscopy;  Laterality: [name];  . CORONARY ANGIOPLASTY WITH STENT PLACEMENT  03/2016; 04/2017   2017-[facility]: BMS to RCA-pt was placed on Brilinta.  04/2017: DES to RCA.  . DORSAL SLIT [name] 10/29/2019   for severe phimosis. Procedure: DORSAL SLIT;  Surgeon: Alexis Frock, MD;  Location: [facility];  Service: Urology;  Laterality: [name];  69 MINS  . EYE SURGERY Bilateral cataract  . HEMORRHOID SURGERY    . INTRAOCULAR LENS INSERTION Bilateral   . KNEE SURGERY Right   . LEFT AND RIGHT HEART CATHETERIZATION WITH CORONARY ANGIOGRAM [name] 12/24/2012   Procedure: LEFT AND RIGHT HEART CATHETERIZATION WITH CORONARY ANGIOGRAM;  Surgeon: [name], MD;  Location: [facility];  Service: Cardiovascular;  Laterality: [name];  . LEG SURGERY Bilateral    lenghtening   . PACEMAKER PLACEMENT  04/13/2016   2nd deg HB after TAVR, pt had DC MDT PPM placed.  [name] SHOULDER ARTHROSCOPY  08/30/2011   Procedure: ARTHROSCOPY SHOULDER;  Surgeon: [name], MD;  Location: [place];  Service: Orthopedics;  Laterality: Right;  Right Shoulder Arthroscopy, Debridement, and Decompression  . SPINAL CORD STIMULATOR INSERTION [name] 09/10/2015   Procedure: LUMBAR SPINAL CORD STIMULATOR INSERTION;  Surgeon: [name], MD;  Location: [place] [facility];  Service: Neurosurgery;  Laterality: [name];  . TEE WITHOUT CARDIOVERSION [name] 09/16/2019   Procedure: TRANSESOPHAGEAL ECHOCARDIOGRAM (TEE);  Surgeon: Skeet Latch, MD;  Location: [place];  Service:  Cardiovascular;  Laterality: [name];  . TEE WITHOUT CARDIOVERSION [name] 12/02/2019   +vegetation on AVR and pacer lead in RV.  EF 55-60%, normal wall motion.  Valves function normal.  Procedure: TRANSESOPHAGEAL ECHOCARDIOGRAM (TEE);  Surgeon: Geralynn Rile, MD;  Location: [place];  Service: Cardiovascular;  Laterality: [name];  . 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  R big toe surg due to osteoarth  . TONSILLECTOMY    . traeculectomy Left    eye  .  TRANSCATHETER AORTIC VALVE REPLACEMENT, TRANSFEMORAL  04/11/2016  . TRANSESOPHAGEAL ECHOCARDIOGRAM  03/09/2016; 09/2019   [facility]: EF 55-60%, PFO seen with bi-directional shunting, no thrombus in appendage.  09/2019 ->no valvular vegetations. Small patent foramen ovale with predominantly left to right shunting across the interatrial septum.  . TRANSTHORACIC ECHOCARDIOGRAM  01/2015; 01/2016; 05/18/16; 09/18/16, 05/2017, 08/2017   01/2015 No signif change in aortic stenosis (moderate).  01/2016 Severe LVH w/small LV cavity, EF 60-65%, grade I diast dysfxn.  05/2016 (s/p TAVR): EF 50-55%, grd I DD, biopros AV good.  08/2016--EF 50-55%, LV septal motion c/w conduction abnl, grd I DD,mild MS,bioprosth aortic valve well seated, w/trace AR. 05/2017 TTE EF 35%. 08/2017-EF 35%, mod diff hypokin LV, grd I DD, biopros AV good.   . TRANSTHORACIC ECHOCARDIOGRAM  04/2018; 09/2019   04/2018: EF 40-45%, mod diffuse LV hypokinesis, grd I DD, bioprosth AV well seated, no AS or AR. 09/2019 EF 60-65%, grd I DD, valves fine, including bioprosth AV  . VITRECTOMY      Family History  Problem Relation Age of Onset  . Hypertension Mother   . Coronary artery disease Mother   . Heart attack Mother   . Neuropathy Mother   . Pulmonary fibrosis Father        asbestosis      Social History   Socioeconomic History  . Marital status: Married    Spouse name: Not on file  . Number of children: 0  . Years of education: 70  . Highest education level: Not on file    Occupational History  . Occupation: Engineer, production    Comment: retired  Tobacco Use  . Smoking status: Never Smoker  . Smokeless tobacco: Never Used  Substance and Sexual Activity  . Alcohol use: No    Alcohol/week: 0.0 standard drinks  . Drug use: No    Types: Oxycodone  . Sexual activity: Not Currently  Other Topics Concern  . Not on file  Social History Narrative   HSG, [name] - BS, [name] - MS-engineering, 2 years on PhD - [employer]. Married - '65 - 38yr/divorced; '76- 3 yrs/divorced; '92 . No children. 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 ACP/Living Will - Yes CPR; long-term Mechanical ventilation as long as he was able to cognate; ok for long term artificial nutrition. Precondition being able to cognate and not to have too much pain.    Social Determinants of Health   Financial Resource Strain:   . Difficulty of Paying Living Expenses: Not on file  Food Insecurity:   . Worried About RCharity fundraiserin the Last Year: Not on file  . Ran Out of Food in the Last Year: Not on file  Transportation Needs:   . Lack of Transportation (Medical): Not on file  . Lack of Transportation (Non-Medical): Not on file  Physical Activity:   . Days of Exercise per Week: Not on file  . Minutes of Exercise per Session: Not on file  Stress:   . Feeling of Stress : Not on file  Social Connections:   . Frequency of Communication with Friends and Family: Not on file  . Frequency of Social Gatherings with Friends and Family: Not on file  . Attends Religious Services: Not on file  . Active Member of Clubs or Organizations: Not on file  . Attends CArchivistMeetings: Not on file  . Marital Status: Not on file    Allergies  Allergen Reactions  . Brimonidine Tartrate Shortness Of Breath    Alphagan-Shortness of breath  . Brinzolamide Shortness Of Breath    AZOPT- Shortness of breath  . Latanoprost Shortness Of Breath    XALATAN-  Shortness of breath  . Nucynta [Tapentadol] Shortness Of Breath  . Sulfa Antibiotics Palpitations  . Timolol Maleate Shortness Of Breath and Other (See Comments)    TIMOPTIC- Aggravated asthma  . Diltiazem Swelling     leg swelling  . Rofecoxib Swelling     VIOXX- leg swelling  . Vancomycin Hives and Other (See Comments)    Possible "Red Man Syndrome"? > hives/blisters  . Codeine Other (See Comments)    Childhood reaction  . Tamsulosin Other (See Comments)    Dizziness   . Celecoxib Other (See Comments)    CELLBREX-confusion  . Colchicine Diarrhea    diarrhea  . Tape Rash     Current Outpatient Medications:  .  albuterol (PROVENTIL HFA;VENTOLIN HFA) 108 (90 BASE) MCG/ACT inhaler, Inhale 2 puffs into the lungs 4 (four) times daily as needed for wheezing or shortness of breath., Disp: , Rfl:  .  allopurinol (ZYLOPRIM) 300 MG tablet, TAKE 1 TABLET BY MOUTH ONCE DAILY WITH FOOD, Disp: 90 tablet, Rfl: 1 .  amoxicillin (AMOXIL) 500 MG capsule, Take 1 capsule (500 mg total) by mouth 3 (three) times daily., Disp: 90 capsule, Rfl: 11 .  apixaban (ELIQUIS) 5 MG TABS tablet, Take 5 mg by mouth 2 (two) times daily., Disp: , Rfl:  .  ASPIRIN LOW DOSE 81 MG EC tablet, Take 81 mg by mouth daily., Disp: , Rfl:  .  B Complex-C (B-COMPLEX WITH VITAMIN C) tablet, Take 1 tablet by mouth daily. Take 1 tablet daily, 239m, Disp: , Rfl:  .  cephALEXin (KEFLEX) 500 MG capsule, Take 1 capsule (500 mg total) by mouth 3 (three) times daily., Disp: 30 capsule, Rfl: 0 .  ezetimibe (ZETIA) 10 MG tablet, Take 1 tablet (10 mg total) by mouth daily., Disp: 30 tablet, Rfl: 1 .  finasteride (PROSCAR) 5 MG tablet, Take 1 tablet (5 mg total) by mouth daily., Disp: 90 tablet, Rfl: 0 .  fluticasone (FLONASE) 50 MCG/ACT nasal spray, Place 2 sprays into both nostrils as needed for allergies or rhinitis., Disp: , Rfl:  .  gabapentin (NEURONTIN) 800 MG tablet, TAKE 1 TABLET (800 MG TOTAL) BY MOUTH 4 (FOUR) TIMES DAILY.,  Disp: 360 tablet, Rfl: 2 .  isosorbide mononitrate (IMDUR) 30 MG 24 hr tablet, Take 30 mg by mouth daily. , Disp: , Rfl:  .  metoprolol succinate (TOPROL-XL) 25 MG 24 hr tablet, Take 25 mg by mouth daily., Disp: , Rfl:  .  niacin (NIASPAN) 1000 MG CR tablet, Take 1 tablet (1,000 mg total) by mouth 2 (two) times daily., Disp: 180 tablet, Rfl: 0 .  nitroGLYCERIN (NITROSTAT) 0.4 MG SL tablet, Place 0.4 mg under the tongue every 5 (five) minutes as needed for chest pain. , Disp: , Rfl:  .  pantoprazole (PROTONIX) 40 MG tablet, TAKE 1 TABLET BY MOUTH EVERY DAY, Disp: 90 tablet, Rfl: 3 .  rosuvastatin (CRESTOR) 40 MG tablet, take 1 tablet by mouth once daily (Patient taking differently: Take 40 mg by mouth daily. ), Disp: 15 tablet, Rfl: 0 .  sacubitril-valsartan (ENTRESTO) 24-26 MG, Take 1 tablet by mouth 2 (two) times daily. , Disp: , Rfl:  .  senna-docusate (SENOKOT-S) 8.6-50 MG tablet, Take 1 tablet by mouth 2 (two) times daily. While taking strong pain  meds to prevent constipation., Disp: 10 tablet, Rfl: 0 .  terbinafine (LAMISIL) 1 % cream, Apply topically., Disp: , Rfl:  .  torsemide (DEMADEX) 20 MG tablet, Take 20 mg by mouth 2 (two) times daily as needed (swelling). , Disp: , Rfl:  .  vitamin C (ASCORBIC ACID) 250 MG tablet, Take 250 mg by mouth daily., Disp: , Rfl:  .  VITAMIN D, CHOLECALCIFEROL, PO, Take 1 tablet by mouth daily. , Disp: , Rfl:  .  WIXELA INHUB 250-50 MCG/DOSE AEPB, TAKE 1 PUFF BY MOUTH TWICE A DAY, Disp: 60 each, Rfl: 11   Review of Systems  Constitutional: Negative for activity change, appetite change, chills, diaphoresis, fatigue, fever and unexpected weight change.  HENT: Negative for congestion, rhinorrhea, sinus pressure, sneezing, sore throat and trouble swallowing.   Eyes: Negative for photophobia and visual disturbance.  Respiratory: Negative for cough, chest tightness, shortness of breath, wheezing and stridor.   Cardiovascular: Negative for chest pain,  palpitations and leg swelling.  Gastrointestinal: Negative for abdominal distention, abdominal pain, anal bleeding, blood in stool, constipation, diarrhea, nausea and vomiting.  Genitourinary: Negative for difficulty urinating, dysuria, flank pain and hematuria.  Musculoskeletal: Negative for arthralgias, back pain, gait problem, joint swelling and myalgias.  Skin: Positive for wound. Negative for pallor and rash.  Neurological: Negative for dizziness, tremors, weakness and light-headedness.  Hematological: Negative for adenopathy. Does not bruise/bleed easily.  Psychiatric/Behavioral: Negative for agitation, behavioral problems, confusion, decreased concentration, dysphoric mood and sleep disturbance.       Objective:   Physical Exam Constitutional:      General: He is not in acute distress.    Appearance: Normal appearance. He is well-developed. He is not ill-appearing or diaphoretic.  HENT:     Head: Normocephalic and atraumatic.     Right Ear: Hearing and external ear normal.     Left Ear: Hearing and external ear normal.     Nose: No nasal deformity or rhinorrhea.     Mouth/Throat:     Lips: Pink.     Mouth: Mucous membranes are moist.     Dentition: Dental caries present.     Tongue: No lesions.     Pharynx: Oropharynx is clear.     Tonsils: No tonsillar exudate or tonsillar abscesses.  Eyes:     General: No scleral icterus.    Conjunctiva/sclera: Conjunctivae normal.     Right eye: Right conjunctiva is not injected.     Left eye: Left conjunctiva is not injected.     Pupils: Pupils are equal, round, and reactive to light.  Neck:     Vascular: No JVD.  Cardiovascular:     Rate and Rhythm: Normal rate and regular rhythm.     Heart sounds: Normal heart sounds, S1 normal and S2 normal. No murmur heard.  No friction rub.  Pulmonary:     Effort: Pulmonary effort is normal. No respiratory distress.     Breath sounds: Normal breath sounds. No stridor. No wheezing.    Abdominal:     General: Bowel sounds are normal. There is no distension.     Palpations: Abdomen is soft.  Musculoskeletal:        General: Normal range of motion.     Right shoulder: Normal.     Left shoulder: Normal.     Cervical back: Normal range of motion and neck supple.     Right hip: Normal.     Left hip: Normal.     Right knee: Normal.  Left knee: Normal.  Lymphadenopathy:     Head:     Right side of head: No submandibular, preauricular or posterior auricular adenopathy.     Left side of head: No submandibular, preauricular or posterior auricular adenopathy.     Cervical: Cervical adenopathy present.     Right cervical: Superficial cervical adenopathy present. No deep cervical adenopathy.    Left cervical: No superficial or deep cervical adenopathy.  Skin:    General: Skin is warm and dry.     Coloration: Skin is not pale.     Findings: No abrasion, bruising, ecchymosis, erythema, lesion or rash.     Nails: There is no clubbing.  Neurological:     General: No focal deficit present.     Mental Status: He is alert and oriented to person, place, and time.     Sensory: No sensory deficit.     Coordination: Coordination normal.     Gait: Gait normal.  Psychiatric:        Attention and Perception: Attention normal. He is attentive.        Mood and Affect: Mood normal.        Speech: Speech normal.        Behavior: Behavior normal. Behavior is cooperative.        Thought Content: Thought content normal.        Judgment: Judgment normal.      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    Amputation site continue to monitor closely being followed closely by Dr. Sharol Given.  Tender area under his jawline which seems to be inflamed lymph node continue to monitor

## 2020-03-19 ENCOUNTER — Ambulatory Visit (INDEPENDENT_AMBULATORY_CARE_PROVIDER_SITE_OTHER): Payer: Medicare Other

## 2020-03-19 ENCOUNTER — Other Ambulatory Visit: Payer: Self-pay

## 2020-03-19 DIAGNOSIS — Z23 Encounter for immunization: Secondary | ICD-10-CM | POA: Diagnosis not present

## 2020-03-19 NOTE — Progress Notes (Signed)
   Covid-19 Vaccination Clinic  Name:  Tim [name]    MRN: [id number] DOB: 05/01/1941  03/19/2020  Tim [name] was observed post Covid-19 immunization for 30 minutes based on pre-vaccination screening without incident. He was provided with Vaccine Information Sheet and instruction to access the V-Safe system.   Tim [name] was instructed to call 911 with any severe reactions post vaccine: [name] Difficulty breathing  . Swelling of face and throat  . A fast heartbeat  . A bad rash all over body  . Dizziness and weakness   Immunizations Administered    Name Date Dose VIS Date Route   Pfizer COVID-19 Vaccine 03/19/2020 11:13 AM 0.3 mL 08/27/2018 Intramuscular   Manufacturer: [employer], [employer]   Lot: [id number]   [place]: [id number]     Co-observed with Elnora Morrison. Landis Gandy, RN

## 2020-03-19 NOTE — Progress Notes (Addendum)
   Covid-19 Vaccination Clinic  Name:  Tim [name]    MRN: [id number] DOB: 17-Jun-1941  03/19/2020  Mr. Halk was observed post Covid-19 immunization for 30 minutes based on pre-vaccination screening without incident. He was provided with Vaccine Information Sheet and instruction to access the V-Safe system.   Mr. Shifflett was instructed to call 911 with any severe reactions post vaccine: [name] Difficulty breathing  . Swelling of face and throat  . A fast heartbeat  . A bad rash all over body  . Dizziness and weakness   Immunizations Administered    Name Date Dose VIS Date Route   Pfizer COVID-19 Vaccine 03/19/2020 11:13 AM 0.3 mL 08/27/2018 Intramuscular   Manufacturer: [place]   Lot: [id number]   [place]: [place]

## 2020-03-22 ENCOUNTER — Encounter: Payer: Self-pay | Admitting: Orthopedic Surgery

## 2020-03-22 ENCOUNTER — Ambulatory Visit (INDEPENDENT_AMBULATORY_CARE_PROVIDER_SITE_OTHER): Payer: Medicare Other | Admitting: Orthopedic Surgery

## 2020-03-22 VITALS — Ht 72.0 in | Wt 320.0 lb

## 2020-03-22 DIAGNOSIS — L97521 Non-pressure chronic ulcer of other part of left foot limited to breakdown of skin: Secondary | ICD-10-CM

## 2020-03-22 DIAGNOSIS — Z89432 Acquired absence of left foot: Secondary | ICD-10-CM

## 2020-03-23 ENCOUNTER — Encounter: Payer: Self-pay | Admitting: Orthopedic Surgery

## 2020-03-23 NOTE — Progress Notes (Signed)
Office Visit Note   Patient: Tim [name]           Date of Birth: 18-Jun-1941           MRN: [id number] Visit Date: 03/22/2020              Requested by: Tammi Sou, MD [address] [place],  [place] [address] PCP: Tammi Sou, MD  Chief Complaint  [name]    08/29/19 left transmet amputation       HPI: Patient is an 79 year old gentleman who presents in follow-up for an bearing ulcer left transmetatarsal amputation he states the wound has opened up and has bleeding he denies any pain.  Patient states he is currently on antibiotics.  Assessment & Plan: Visit Diagnoses:  1. History of transmetatarsal amputation of left foot ([place])   2. Non-pressure chronic ulcer of other part of left foot limited to breakdown of skin ([place])     Plan: Ulcer was debrided of skin and soft tissue there is a healthy wound bed no exposed bone or tendon no signs of infection he will continue with Iodosorb dressing changes continue with his shoes with custom orthotics and spacer.  Follow-Up Instructions: Return in about 4 weeks (around 04/19/2020).   Ortho Exam  [name], oriented, no adenopathy, well-dressed, normal affect, normal respiratory effort. Examination patient does have venous stasis swelling of the left lower extremity his foot is plantigrade there is no ascending cellulitis or redness.  He does have an bearing pressure ulcer over the middle and of the residual limb.  Ulcer is 10 mm in diameter.  After informed consent a 10 blade knife was used to breed the skin and soft tissue back to healthy viable granulation tissue after debridement the ulcer is 2 cm in diameter 2 mm deep with healthy granulation tissue no exposed bone or tendon silver nitrate was used venous stasis a dry dressing was applied.  Imaging: No results found. No images are attached to the encounter.  Labs: Lab Results  Component Value Date   HGBA1C 5.6  10/02/2019   HGBA1C 6.1 03/27/2019   HGBA1C 6.1 09/16/2018   ESRSEDRATE 20 02/09/2016   ESRSEDRATE 36 (H) 01/28/2014   CRP 2.5 (H) 11/26/2019   LABURIC 3.5 (L) 09/10/2013   REPTSTATUS 12/06/2019 FINAL 12/04/2019   CULT  12/04/2019    NO GROWTH Performed at [facility], [place] [address], [place],  [address]    LABORGA ENTEROCOCCUS FAECALIS 09/11/2019     Lab Results  Component Value Date   ALBUMIN 2.5 (L) 11/28/2019   ALBUMIN 2.9 (L) 11/27/2019   ALBUMIN 2.9 (L) 11/26/2019   LABURIC 3.5 (L) 09/10/2013    Lab Results  Component Value Date   MG 1.8 12/03/2019   MG 1.4 (L) 12/02/2019   MG 1.8 09/17/2019   No results found for: VD25OH  No results found for: PREALBUMIN CBC EXTENDED Latest Ref Rng & Units 12/06/2019 12/05/2019 12/04/2019  WBC 4.0 - 10.5 K/uL 6.3 6.0 5.5  RBC 4.22 - 5.81 MIL/uL 3.25(L) 3.24(L) 3.33(L)  HGB 13.0 - 17.0 g/dL 9.4(L) 9.4(L) 9.6(L)  HCT 39 - 52 % 30.5(L) 30.1(L) 30.7(L)  PLT 150 - 400 K/uL 139(L) 134(L) 148(L)  NEUTROABS 1.7 - 7.7 K/uL 4.5 3.9 3.6  LYMPHSABS 0.7 - 4.0 K/uL 0.9 1.3 1.2     Body mass index is 43.4 kg/m.  Orders:  No orders of  the defined types were placed in this encounter.  No orders of the defined types were placed in this encounter.    Procedures: No procedures performed  Clinical Data: No additional findings.  ROS:  All other systems negative, except as noted in the HPI. Review of Systems  Objective: Vital Signs: Ht 6' (1.829 m)   Wt (!) 320 lb (145.2 kg)   BMI 43.40 kg/m   Specialty Comments:  No specialty comments available.  PMFS History: Patient Active Problem List   Diagnosis Date Noted  . Cervical lymphadenopathy 03/15/2020  . 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 ICD (implantable cardioverter-defibrillator) infection ([place]) 12/22/2019  . Gram-positive bacteremia 11/27/2019  . FUO (fever of unknown origin) 11/26/2019  . Low blood pressure reading 11/05/2019  . Wound  dehiscence 10/22/2019  . PFO (patent foramen ovale) 10/06/2019  . ICD (implantable cardioverter-defibrillator) battery depletion   . Pressure injury of skin 09/13/2019  . Enterococcal bacteremia history  09/13/2019  . 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  . 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 Urinary retention 09/13/2019  . Phimosis 09/13/2019  . SIRS (systemic inflammatory response syndrome) ([place]) 09/12/2019  . Sepsis ([place]) 09/12/2019  . Enterococcus UTI 09/12/2019  . Cellulitis of foot   . History of transmetatarsal amputation of left foot ([place])   . Subacute osteomyelitis, left ankle and foot ([place])   . 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 Diabetic neuropathy, painful ([place]) 12/26/2016  . Neuropathic pain of foot, left 06/20/2016  . 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 Status post amputation of left great toe ([place]) 06/08/2015  . Spinal stenosis of lumbar region 02/16/2015  . Hereditary and idiopathic peripheral neuropathy 01/12/2015  . Oral thrush 12/28/2014  . Orthostatic dizziness 05/24/2014  . Paresthesias with subjective weakness 01/30/2014  . Bilateral thigh pain 01/30/2014  . Abdominal wall pain in right upper quadrant 01/22/2014  . Gross hematuria 09/25/2013  . Intertrigo 09/25/2013  . IBS (irritable bowel syndrome) 09/11/2013  . Chronic pain syndrome 08/06/2013  . CAD (coronary artery disease), native coronary artery 12/25/2012  . Paroxysmal atrial fibrillation ([place]) 12/25/2012  . Moderate aortic stenosis 12/25/2012  . Aortic stenosis 12/24/2012  . Personal history of colonic adenoma 10/30/2012  . Diverticulosis of colon (without mention of hemorrhage) 10/30/2012  . Internal hemorrhoids 10/30/2012  . Change in bowel habits intermittent loose stools 10/30/2012  . Routine health maintenance 06/09/2012  . Hereditary peripheral neuropathy  10/10/2011  . Long term current use of anticoagulant - apixiban 08/16/2010  . Dyspnea on exertion 09/08/2009  . HYPERCHOLESTEROLEMIA-PURE 01/13/2009  . Essential hypertension, benign 01/13/2009  . EDEMA 01/13/2009  . GOUT 01/12/2009  . GLAUCOMA 01/12/2009  . Venous insufficiency of both lower extremities 01/12/2009  . COPD (chronic obstructive pulmonary disease) ([place]) 01/12/2009  . VENTRAL HERNIA 01/12/2009  . OSTEOARTHRITIS 01/12/2009  . ARTHRITIS 01/12/2009  . DEGENERATIVE DISC DISEASE 01/12/2009  . CATARACT, HX OF 01/12/2009  . HEART MURMUR, HX OF 01/12/2009  . BENIGN PROSTATIC HYPERTROPHY, HX OF 01/12/2009  . Obesity 01/17/2007  . OBSTRUCTIVE SLEEP APNEA 01/17/2007  . ASTHMA 01/17/2007  . GERD 01/17/2007  . HALLUX VALGUS, ACQUIRED 01/17/2007  . Pulmonary hypertension ([place]) 01/17/2007   Past Medical History:  Diagnosis Date  . AICD (automatic cardioverter/defibrillator) present   . ASTHMA   . Asthma    as a child  . Balanitis    +severe phimosis+ buried penis->circ not possible but dorsal slit  done 10/2019  . BPH (benign prostatic hypertrophy)    with urinary retention.  Renal u/s 12/04/19 NORMAL KIDNEYS, NORMAL BLADDER, NO HYDRONEPHROSIS  . CAD (coronary artery disease)    Nonobstructive by 12/2012 cath; then 03/2016 he required BMS to RCA ([facility]).  In-stent restenosis on cath 11/01/16, baloon angioplasty successful--pt to take plavix (no ASA), + eliquis (for PAF).  [name] CATARACT, HX OF   . Chronic combined systolic and diastolic CHF (congestive heart failure) ([place]) 05/2016   Ischemic CM.  04/2018 EF 40-45%, grd I DD.  [name] Chronic pain syndrome    Lumbar DDD; chronic neuropathic pain (DM); has spinal stimulator and sees pain mgmt MD  . Complicated UTI (urinary tract infection) 09/2019   Phimoses, acute urinary retention, entoerococcus UTI, enterococc bacteremia.  F/u blood clx's neg x 5d.    [name] COPD   . Diabetes mellitus type 2 with complications (HCC)    BUL8G jumped from 5.7%  to 6.1% 03/2015---started metformin at that time.  DM 2 dx by fasting gluc criteria 2018.  Has chronic neuropath pain  . Elevated transaminase level 02/2016   Suspect due to fatty liver (documented on u/s 2007). Plan repeat labs 03/2016.  [name] Enterococcal bacteremia 09/2019   Phimoses, acute urinary retention, entoerococcus UTI, enterococc bacteremia.  F/u blood clx's neg x 5d.    . Essential hypertension, benign   . Fatty liver 2007   2007 u/s showed fatty liver with hepatosplenomegaly.  2019 repeat u/s->fatty liver but no cirrhosis or hepatosplenomegaly.  . FUO (fever of unknown origin) 11/26/2019  . GERD (gastroesophageal reflux disease)   . Glaucoma   . GOUT   . Hallux valgus (acquired)   . HH (hiatus hernia)   . HYPERCHOLESTEROLEMIA-PURE   . Hypogonadism male   . ICD (implantable cardioverter-defibrillator) infection ([place]) 12/22/2019  . 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 Tommy Medal)  . Lumbosacral neuritis   . Lumbosacral spondylosis    Lumbar spinal stenosis with neurogenic claudication--contributes to his chronic pain syndrome  . Normal memory function 08/2014   Neuropsychological testing ([facility]): no cognitive impairment or sign of neurodegenerative disorder.  Likely has adjustment d/o with mixed anxiety/depressed features and may benefit from low dose SNRI.    [name] Normocytic anemia 03/2016   Mild-pt needs ferritin and vit B12 level checked (as of 03/22/16). Hb stable 09/2019  . NSTEMI (non-ST elevated myocardial infarction) ([place]) 03/20/2016   BMS to RCA  . Obesity hypoventilation syndrome ([place])   . Obesity, unspecified   . Orthostatic hypotension   . OSA on CPAP    8 cm H2O  . OSTEOARTHRITIS   . Paroxysmal atrial fibrillation ([place]) 2003    (? chronic?) Off anticoag for a while due to falls.  Then apixaban started 12/2014.  [name] Peripheral neuropathy    DPN (+[facility];  with chronic neuropathic pain--Dr. Ella Bodo): neuropathic pain->diff to treat, failed nucynta, failed spinal stimul trial, oxycontin hs + tramadol + gabap as of 12/2017 f/u Dr. Letta Pate.  . Personal history of colonic adenoma 10/30/2012   Diminutive adenoma, consider repeat 2019 per GI  . PFO (patent foramen ovale) 09/2019   small, with predominately L to R shunt  . Pneumonia   . Presence of cardiac defibrillator 11/07/2017  . Primary osteoarthritis of both knees    Bone on bone of medial compartments, + signif patellofemoral arth bilat.--supartz inj series started 09/12/17  . Prosthetic valve endocarditis (  [place]) 12/22/2019  . PUD (peptic ulcer disease)   . PULMONARY HYPERTENSION, HX OF   . Secondary male hypogonadism 2017  . 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 Shortness of breath    with exertion: much improved s/p TAVR and treatment for CHF.  . Sick sinus syndrome (HCC)    PPM placed  . Thrombocytopenia ([place]) 2018   HSM on 2007 abd u/s---suspect some mild splenic sequestration chronically.  [name] Unspecified glaucoma(365.9)   . Unspecified hereditary and idiopathic peripheral neuropathy approx age 31   bilat LE's, ? left arm, too.  Feet became progressively numb + left foot pain intermittently.  Pt may be trying a spinal stimulator (as of 05/2015)  . VENOUS INSUFFICIENCY    Being followed by Dr. Sharol Given as of 10/2016 for two R LL venous stasis ulcers/skin tears.  Healed as of 10/30/16 f/u with Dr. Sharol Given.  . VENTRAL HERNIA   . Wound dehiscence 10/22/2019    Family History  Problem Relation Age of Onset  . Hypertension Mother   . Coronary artery disease Mother   . Heart attack Mother   . Neuropathy Mother   . Pulmonary fibrosis Father        asbestosis    Past Surgical History:  Procedure Laterality Date  . AMPUTATION Left 04/11/2013   Procedure: AMPUTATION DIGIT Left 3rd toe;  Surgeon: [name], MD;  Location: [facility];   Service: Orthopedics;  Laterality: Left;  Left 3rd toe amputation at [facility]  . AMPUTATION Left 08/29/2019   Procedure: LEFT TRANSMETATARSAL AMPUTATION;  Surgeon: [name], MD;  Location: [facility];  Service: Orthopedics;  Laterality: Left;  . CARDIAC CATHETERIZATION  1997; 03/10/16   1997 Non-obstructive disease.  03/2016 BMS to RCA, with 25% pDiag dz, o/w normal cors per cath 03/07/16.  Cath 11/01/16: in stent restenosis, successful baloon angioplasty.  [name] CARDIAC CATHETERIZATION  12/24/2012   mild < 20% LCx, prox 30% RCA; LVEF 55-65% , moderate pulmonary HTN, moderate AS  . CARDIAC DEFIBRILLATOR PLACEMENT  11/07/2017   [facility] MRI Quad CRT defibrillator  . CARDIOVASCULAR STRESS TEST  05/11/16 ([facility])   Myocardial perfusion imaging:  No ischemia; scar in apex, global hypokinesis, EF 36%.  . Carotid dopplers  03/09/2016   [facility]: no hemodynamically significant stenosis on either side.  . CHOLECYSTECTOMY    . COLONOSCOPY    . COLONOSCOPY [name] 10/30/2012   Procedure: COLONOSCOPY;  Surgeon: [name], MD;  Location: [facility];  Service: Endoscopy;  Laterality: [name];  . CORONARY ANGIOPLASTY WITH STENT PLACEMENT  03/2016; 04/2017   2017-[facility]: BMS to RCA-pt was placed on Brilinta.  04/2017: DES to RCA.  . DORSAL SLIT [name] 10/29/2019   for severe phimosis. Procedure: DORSAL SLIT;  Surgeon: Alexis Frock, MD;  Location: [facility];  Service: Urology;  Laterality: [name];  94 MINS  . EYE SURGERY Bilateral cataract  . HEMORRHOID SURGERY    . INTRAOCULAR LENS INSERTION Bilateral   . KNEE SURGERY Right   . LEFT AND RIGHT HEART CATHETERIZATION WITH CORONARY ANGIOGRAM [name] 12/24/2012   Procedure: LEFT AND RIGHT HEART CATHETERIZATION WITH CORONARY ANGIOGRAM;  Surgeon: [name], MD;  Location: [facility] C[facility] LAB;  Service: Cardiovascular;  Laterality: [name];  . LEG SURGERY Bilateral    lenghtening   . PACEMAKER PLACEMENT  04/13/2016   2nd deg HB after TAVR, pt had DC MDT PPM placed.  [name] SHOULDER ARTHROSCOPY  08/30/2011    Procedure: ARTHROSCOPY SHOULDER;  Surgeon: [name], MD;  Location: [facility];  Service: Orthopedics;  Laterality: Right;  Right Shoulder Arthroscopy, Debridement, and Decompression  . SPINAL CORD STIMULATOR INSERTION [name] 09/10/2015   Procedure: LUMBAR SPINAL CORD STIMULATOR INSERTION;  Surgeon: [name], MD;  Location: [facility];  Service: Neurosurgery;  Laterality: [name];  . TEE WITHOUT CARDIOVERSION [name] 09/16/2019   Procedure: TRANSESOPHAGEAL ECHOCARDIOGRAM (TEE);  Surgeon: Skeet Latch, MD;  Location: [facility];  Service: Cardiovascular;  Laterality: [name];  . TEE WITHOUT CARDIOVERSION [name] 12/02/2019   +vegetation on AVR and pacer lead in RV.  EF 55-60%, normal wall motion.  Valves function normal.  Procedure: TRANSESOPHAGEAL ECHOCARDIOGRAM (TEE);  Surgeon: Geralynn Rile, MD;  Location: [facility];  Service: Cardiovascular;  Laterality: [name];  . 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  R big toe surg due to osteoarth  . TONSILLECTOMY    . traeculectomy Left    eye  . TRANSCATHETER AORTIC VALVE REPLACEMENT, TRANSFEMORAL  04/11/2016  . TRANSESOPHAGEAL ECHOCARDIOGRAM  03/09/2016; 09/2019   [facility]: EF 55-60%, PFO seen with bi-directional shunting, no thrombus in appendage.  09/2019 ->no valvular vegetations. Small patent foramen ovale with predominantly left to right shunting across the interatrial septum.  . TRANSTHORACIC ECHOCARDIOGRAM  01/2015; 01/2016; 05/18/16; 09/18/16, 05/2017, 08/2017   01/2015 No signif change in aortic stenosis (moderate).  01/2016 Severe LVH w/small LV cavity, EF 60-65%, grade I diast dysfxn.  05/2016 (s/p TAVR): EF 50-55%, grd I DD, biopros AV good.  08/2016--EF 50-55%, LV septal motion c/w conduction abnl, grd I DD,mild MS,bioprosth aortic valve well seated, w/trace AR. 05/2017 TTE EF 35%. 08/2017-EF 35%, mod diff hypokin LV, grd I DD, biopros AV good.   . TRANSTHORACIC ECHOCARDIOGRAM  04/2018; 09/2019   04/2018: EF 40-45%, mod diffuse LV hypokinesis, grd  I DD, bioprosth AV well seated, no AS or AR. 09/2019 EF 60-65%, grd I DD, valves fine, including bioprosth AV  . VITRECTOMY     Social History   Occupational History  . Occupation: Engineer, production    Comment: retired  Tobacco Use  . Smoking status: Never Smoker  . Smokeless tobacco: Never Used  Substance and Sexual Activity  . Alcohol use: No    Alcohol/week: 0.0 standard drinks  . Drug use: No    Types: Oxycodone  . Sexual activity: Not Currently

## 2020-04-19 ENCOUNTER — Ambulatory Visit (INDEPENDENT_AMBULATORY_CARE_PROVIDER_SITE_OTHER): Payer: Medicare Other | Admitting: Orthopedic Surgery

## 2020-04-19 ENCOUNTER — Encounter: Payer: Self-pay | Admitting: Orthopedic Surgery

## 2020-04-19 VITALS — Ht 72.0 in | Wt 320.0 lb

## 2020-04-19 DIAGNOSIS — Z6841 Body Mass Index (BMI) 40.0 and over, adult: Secondary | ICD-10-CM | POA: Diagnosis not present

## 2020-04-19 DIAGNOSIS — M86272 Subacute osteomyelitis, left ankle and foot: Secondary | ICD-10-CM | POA: Diagnosis not present

## 2020-04-19 DIAGNOSIS — Z89432 Acquired absence of left foot: Secondary | ICD-10-CM | POA: Diagnosis not present

## 2020-04-19 DIAGNOSIS — L97521 Non-pressure chronic ulcer of other part of left foot limited to breakdown of skin: Secondary | ICD-10-CM

## 2020-04-19 NOTE — Progress Notes (Signed)
Office Visit Note   Patient: Tim [name]           Date of Birth: 1940-09-05           MRN: [id number] Visit Date: 04/19/2020              Requested by: Tammi Sou, MD [id number] [place],  [place] [address] PCP: Tammi Sou, MD  Chief Complaint  [name]    08/29/19 left transmet amputation       HPI: Who is 8 months status post transmetatarsal amputation he has a small open wound with healthy granulation tissue and he is concerned about deep infection.  Assessment & Plan: Visit Diagnoses: No diagnosis found.  Plan: We will request an MRI scan to rule out osteomyelitis in the midfoot.  Discussed that if there is bony infection we would need to proceed with revision surgery patient states he understands.  Follow-up after the MRI scan is obtained.  Follow-Up Instructions: Return if symptoms worsen or fail to improve.   Ortho Exam  [name], oriented, no adenopathy, well-dressed, normal affect, normal respiratory effort. Examination patient has a good dorsalis pedis pulse with good macro circulation to the ankle the foot is warm skin color is normal no clinical signs of arterial insufficiency he is wearing the CircAid compression wraps for venous insufficiency.  The ulcer has healthy granulation tissue with epiboly around the wound edges.  After informed consent a 10 blade knife was used to debride the skin and soft tissue back to healthy viable granulation tissue the ulcer is 3 cm in diameter 3 mm deep this was touched with silver nitrate.  There is no exposed bone or tendon no necrotic tissue.  Imaging: No results found. No images are attached to the encounter.  Labs: Lab Results  Component Value Date   HGBA1C 5.6 10/02/2019   HGBA1C 6.1 03/27/2019   HGBA1C 6.1 09/16/2018   ESRSEDRATE 20 02/09/2016   ESRSEDRATE 36 (H) 01/28/2014   CRP 2.5 (H) 11/26/2019   LABURIC 3.5 (L) 09/10/2013   REPTSTATUS  12/06/2019 FINAL 12/04/2019   CULT  12/04/2019    NO GROWTH Performed at [facility], [place] [address], [place],  [address]    LABORGA ENTEROCOCCUS FAECALIS 09/11/2019     Lab Results  Component Value Date   ALBUMIN 2.5 (L) 11/28/2019   ALBUMIN 2.9 (L) 11/27/2019   ALBUMIN 2.9 (L) 11/26/2019   LABURIC 3.5 (L) 09/10/2013    Lab Results  Component Value Date   MG 1.8 12/03/2019   MG 1.4 (L) 12/02/2019   MG 1.8 09/17/2019   No results found for: VD25OH  No results found for: PREALBUMIN CBC EXTENDED Latest Ref Rng & Units 12/06/2019 12/05/2019 12/04/2019  WBC 4.0 - 10.5 K/uL 6.3 6.0 5.5  RBC 4.22 - 5.81 MIL/uL 3.25(L) 3.24(L) 3.33(L)  HGB 13.0 - 17.0 g/dL 9.4(L) 9.4(L) 9.6(L)  HCT 39 - 52 % 30.5(L) 30.1(L) 30.7(L)  PLT 150 - 400 K/uL 139(L) 134(L) 148(L)  NEUTROABS 1.7 - 7.7 K/uL 4.5 3.9 3.6  LYMPHSABS 0.7 - 4.0 K/uL 0.9 1.3 1.2     Body mass index is 43.4 kg/m.  Orders:  No orders of the defined types were placed in this encounter.  No orders of the defined types were placed in this encounter.    Procedures: No procedures performed  Clinical Data: No additional findings.  ROS:  All  other systems negative, except as noted in the HPI. Review of Systems  Objective: Vital Signs: Ht 6' (1.829 m)   Wt (!) 320 lb (145.2 kg)   BMI 43.40 kg/m   Specialty Comments:  No specialty comments available.  PMFS History: Patient Active Problem List   Diagnosis Date Noted  . Cervical lymphadenopathy 03/15/2020  . 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 ICD (implantable cardioverter-defibrillator) infection ([place]) 12/22/2019  . Gram-positive bacteremia 11/27/2019  . FUO (fever of unknown origin) 11/26/2019  . Low blood pressure reading 11/05/2019  . Wound dehiscence 10/22/2019  . PFO (patent foramen ovale) 10/06/2019  . ICD (implantable cardioverter-defibrillator) battery depletion   . Pressure injury of skin 09/13/2019  . Enterococcal bacteremia  history  09/13/2019  . 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  . 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 Urinary retention 09/13/2019  . Phimosis 09/13/2019  . SIRS (systemic inflammatory response syndrome) ([place]) 09/12/2019  . Sepsis ([place]) 09/12/2019  . Enterococcus UTI 09/12/2019  . Cellulitis of foot   . History of transmetatarsal amputation of left foot ([place])   . Subacute osteomyelitis, left ankle and foot ([place])   . 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 Diabetic neuropathy, painful ([place]) 12/26/2016  . Neuropathic pain of foot, left 06/20/2016  . 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 Status post amputation of left great toe ([place]) 06/08/2015  . Spinal stenosis of lumbar region 02/16/2015  . Hereditary and idiopathic peripheral neuropathy 01/12/2015  . Oral thrush 12/28/2014  . Orthostatic dizziness 05/24/2014  . Paresthesias with subjective weakness 01/30/2014  . Bilateral thigh pain 01/30/2014  . Abdominal wall pain in right upper quadrant 01/22/2014  . Gross hematuria 09/25/2013  . Intertrigo 09/25/2013  . IBS (irritable bowel syndrome) 09/11/2013  . Chronic pain syndrome 08/06/2013  . CAD (coronary artery disease), native coronary artery 12/25/2012  . Paroxysmal atrial fibrillation ([place]) 12/25/2012  . Moderate aortic stenosis 12/25/2012  . Aortic stenosis 12/24/2012  . Personal history of colonic adenoma 10/30/2012  . Diverticulosis of colon (without mention of hemorrhage) 10/30/2012  . Internal hemorrhoids 10/30/2012  . Change in bowel habits intermittent loose stools 10/30/2012  . Routine health maintenance 06/09/2012  . Hereditary peripheral neuropathy 10/10/2011  . Long term current use of anticoagulant - apixiban 08/16/2010  . Dyspnea on exertion 09/08/2009  . HYPERCHOLESTEROLEMIA-PURE 01/13/2009  . Essential hypertension, benign 01/13/2009  . EDEMA  01/13/2009  . GOUT 01/12/2009  . GLAUCOMA 01/12/2009  . Venous insufficiency of both lower extremities 01/12/2009  . COPD (chronic obstructive pulmonary disease) ([place]) 01/12/2009  . VENTRAL HERNIA 01/12/2009  . OSTEOARTHRITIS 01/12/2009  . ARTHRITIS 01/12/2009  . DEGENERATIVE DISC DISEASE 01/12/2009  . CATARACT, HX OF 01/12/2009  . HEART MURMUR, HX OF 01/12/2009  . BENIGN PROSTATIC HYPERTROPHY, HX OF 01/12/2009  . Obesity 01/17/2007  . OBSTRUCTIVE SLEEP APNEA 01/17/2007  . ASTHMA 01/17/2007  . GERD 01/17/2007  . HALLUX VALGUS, ACQUIRED 01/17/2007  . Pulmonary hypertension ([place]) 01/17/2007   Past Medical History:  Diagnosis Date  . AICD (automatic cardioverter/defibrillator) present   . ASTHMA   . Asthma    as a child  . Balanitis    +severe phimosis+ buried penis->circ not possible but dorsal slit done 10/2019  . BPH (benign prostatic hypertrophy)    with urinary retention.  Renal u/s 12/04/19 NORMAL KIDNEYS, NORMAL BLADDER, NO HYDRONEPHROSIS  . CAD (coronary artery disease)    Nonobstructive by 12/2012 cath;  then 03/2016 he required BMS to RCA ([facility]).  In-stent restenosis on cath 11/01/16, baloon angioplasty successful--pt to take plavix (no ASA), + eliquis (for PAF).  [name] CATARACT, HX OF   . Chronic combined systolic and diastolic CHF (congestive heart failure) ([place]) 05/2016   Ischemic CM.  04/2018 EF 40-45%, grd I DD.  [name] Chronic pain syndrome    Lumbar DDD; chronic neuropathic pain (DM); has spinal stimulator and sees pain mgmt MD  . Complicated UTI (urinary tract infection) 09/2019   Phimoses, acute urinary retention, entoerococcus UTI, enterococc bacteremia.  F/u blood clx's neg x 5d.    [name] COPD   . Diabetes mellitus type 2 with complications (HCC)    SJG2E jumped from 5.7% to 6.1% 03/2015---started metformin at that time.  DM 2 dx by fasting gluc criteria 2018.  Has chronic neuropath pain  . Elevated transaminase level 02/2016   Suspect due to fatty liver (documented on u/s  2007). Plan repeat labs 03/2016.  [name] Enterococcal bacteremia 09/2019   Phimoses, acute urinary retention, entoerococcus UTI, enterococc bacteremia.  F/u blood clx's neg x 5d.    . Essential hypertension, benign   . Fatty liver 2007   2007 u/s showed fatty liver with hepatosplenomegaly.  2019 repeat u/s->fatty liver but no cirrhosis or hepatosplenomegaly.  . FUO (fever of unknown origin) 11/26/2019  . GERD (gastroesophageal reflux disease)   . Glaucoma   . GOUT   . Hallux valgus (acquired)   . HH (hiatus hernia)   . HYPERCHOLESTEROLEMIA-PURE   . Hypogonadism male   . ICD (implantable cardioverter-defibrillator) infection ([place]) 12/22/2019  . 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 Tommy Medal)  . Lumbosacral neuritis   . Lumbosacral spondylosis    Lumbar spinal stenosis with neurogenic claudication--contributes to his chronic pain syndrome  . Normal memory function 08/2014   Neuropsychological testing ([facility]): no cognitive impairment or sign of neurodegenerative disorder.  Likely has adjustment d/o with mixed anxiety/depressed features and may benefit from low dose SNRI.    [name] Normocytic anemia 03/2016   Mild-pt needs ferritin and vit B12 level checked (as of 03/22/16). Hb stable 09/2019  . NSTEMI (non-ST elevated myocardial infarction) ([place]) 03/20/2016   BMS to RCA  . Obesity hypoventilation syndrome ([place])   . Obesity, unspecified   . Orthostatic hypotension   . OSA on CPAP    8 cm H2O  . OSTEOARTHRITIS   . Paroxysmal atrial fibrillation ([place]) 2003    (? chronic?) Off anticoag for a while due to falls.  Then apixaban started 12/2014.  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 Ella Bodo): neuropathic pain->diff to treat, failed nucynta, failed spinal stimul trial, oxycontin hs + tramadol + gabap as of 12/2017 f/u Dr. Letta Pate.  . Personal history  of colonic adenoma 10/30/2012   Diminutive adenoma, consider repeat 2019 per GI  . PFO (patent foramen ovale) 09/2019   small, with predominately L to R shunt  . Pneumonia   . Presence of cardiac defibrillator 11/07/2017  . Primary osteoarthritis of both knees    Bone on bone of medial compartments, + signif patellofemoral arth bilat.--supartz inj series started 09/12/17  . 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 PUD (peptic ulcer disease)   . PULMONARY HYPERTENSION, HX OF   . Secondary male hypogonadism 2017  . Severe aortic stenosis    by cath by [facility] 03/2016; CT surgery  saw him and did TAVR 04/11/16 ([facility])  . Shortness of breath    with exertion: much improved s/p TAVR and treatment for CHF.  . Sick sinus syndrome (HCC)    PPM placed  . Thrombocytopenia ([place]) 2018   HSM on 2007 abd u/s---suspect some mild splenic sequestration chronically.  [name] Unspecified glaucoma(365.9)   . Unspecified hereditary and idiopathic peripheral neuropathy approx age 89   bilat LE's, ? left arm, too.  Feet became progressively numb + left foot pain intermittently.  Pt may be trying a spinal stimulator (as of 05/2015)  . VENOUS INSUFFICIENCY    Being followed by Dr. Sharol Given as of 10/2016 for two R LL venous stasis ulcers/skin tears.  Healed as of 10/30/16 f/u with Dr. Sharol Given.  . VENTRAL HERNIA   . Wound dehiscence 10/22/2019    Family History  Problem Relation Age of Onset  . Hypertension Mother   . Coronary artery disease Mother   . Heart attack Mother   . Neuropathy Mother   . Pulmonary fibrosis Father        asbestosis    Past Surgical History:  Procedure Laterality Date  . AMPUTATION Left 04/11/2013   Procedure: AMPUTATION DIGIT Left 3rd toe;  Surgeon: [name], MD;  Location: [facility];  Service: Orthopedics;  Laterality: Left;  Left 3rd toe amputation at [facility]  . AMPUTATION Left 08/29/2019   Procedure: LEFT TRANSMETATARSAL AMPUTATION;  Surgeon: [name], MD;  Location: [facility];   Service: Orthopedics;  Laterality: Left;  . CARDIAC CATHETERIZATION  1997; 03/10/16   1997 Non-obstructive disease.  03/2016 BMS to RCA, with 25% pDiag dz, o/w normal cors per cath 03/07/16.  Cath 11/01/16: in stent restenosis, successful baloon angioplasty.  [name] CARDIAC CATHETERIZATION  12/24/2012   mild < 20% LCx, prox 30% RCA; LVEF 55-65% , moderate pulmonary HTN, moderate AS  . CARDIAC DEFIBRILLATOR PLACEMENT  11/07/2017   [facility] MRI Quad CRT defibrillator  . CARDIOVASCULAR STRESS TEST  05/11/16 ([facility])   Myocardial perfusion imaging:  No ischemia; scar in apex, global hypokinesis, EF 36%.  . Carotid dopplers  03/09/2016   [facility]: no hemodynamically significant stenosis on either side.  . CHOLECYSTECTOMY    . COLONOSCOPY    . COLONOSCOPY [name] 10/30/2012   Procedure: COLONOSCOPY;  Surgeon: [name], MD;  Location: [facility];  Service: Endoscopy;  Laterality: [name];  . CORONARY ANGIOPLASTY WITH STENT PLACEMENT  03/2016; 04/2017   2017-[facility]: BMS to RCA-pt was placed on Brilinta.  04/2017: DES to RCA.  . DORSAL SLIT [name] 10/29/2019   for severe phimosis. Procedure: DORSAL SLIT;  Surgeon: Alexis Frock, MD;  Location: [facility];  Service: Urology;  Laterality: [name];  51 MINS  . EYE SURGERY Bilateral cataract  . HEMORRHOID SURGERY    . INTRAOCULAR LENS INSERTION Bilateral   . KNEE SURGERY Right   . LEFT AND RIGHT HEART CATHETERIZATION WITH CORONARY ANGIOGRAM [name] 12/24/2012   Procedure: LEFT AND RIGHT HEART CATHETERIZATION WITH CORONARY ANGIOGRAM;  Surgeon: [name], MD;  Location: [facility] LAB;  Service: Cardiovascular;  Laterality: [name];  . LEG SURGERY Bilateral    lenghtening   . PACEMAKER PLACEMENT  04/13/2016   2nd deg HB after TAVR, pt had DC MDT PPM placed.  [name] SHOULDER ARTHROSCOPY  08/30/2011   Procedure: ARTHROSCOPY SHOULDER;  Surgeon: [name], MD;  Location: [facility];  Service: Orthopedics;  Laterality: Right;  Right Shoulder Arthroscopy, Debridement, and Decompression  . SPINAL  CORD STIMULATOR INSERTION  [name] 09/10/2015   Procedure: LUMBAR SPINAL CORD STIMULATOR INSERTION;  Surgeon: [name], MD;  Location: [facility];  Service: Neurosurgery;  Laterality: [name];  . TEE WITHOUT CARDIOVERSION [name] 09/16/2019   Procedure: TRANSESOPHAGEAL ECHOCARDIOGRAM (TEE);  Surgeon: Skeet Latch, MD;  Location: [facility];  Service: Cardiovascular;  Laterality: [name];  . TEE WITHOUT CARDIOVERSION [name] 12/02/2019   +vegetation on AVR and pacer lead in RV.  EF 55-60%, normal wall motion.  Valves function normal.  Procedure: TRANSESOPHAGEAL ECHOCARDIOGRAM (TEE);  Surgeon: Geralynn Rile, MD;  Location: [facility];  Service: Cardiovascular;  Laterality: [name];  . 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  R big toe surg due to osteoarth  . TONSILLECTOMY    . traeculectomy Left    eye  . TRANSCATHETER AORTIC VALVE REPLACEMENT, TRANSFEMORAL  04/11/2016  . TRANSESOPHAGEAL ECHOCARDIOGRAM  03/09/2016; 09/2019   [facility]: EF 55-60%, PFO seen with bi-directional shunting, no thrombus in appendage.  09/2019 ->no valvular vegetations. Small patent foramen ovale with predominantly left to right shunting across the interatrial septum.  . TRANSTHORACIC ECHOCARDIOGRAM  01/2015; 01/2016; 05/18/16; 09/18/16, 05/2017, 08/2017   01/2015 No signif change in aortic stenosis (moderate).  01/2016 Severe LVH w/small LV cavity, EF 60-65%, grade I diast dysfxn.  05/2016 (s/p TAVR): EF 50-55%, grd I DD, biopros AV good.  08/2016--EF 50-55%, LV septal motion c/w conduction abnl, grd I DD,mild MS,bioprosth aortic valve well seated, w/trace AR. 05/2017 TTE EF 35%. 08/2017-EF 35%, mod diff hypokin LV, grd I DD, biopros AV good.   . TRANSTHORACIC ECHOCARDIOGRAM  04/2018; 09/2019   04/2018: EF 40-45%, mod diffuse LV hypokinesis, grd I DD, bioprosth AV well seated, no AS or AR. 09/2019 EF 60-65%, grd I DD, valves fine, including bioprosth AV  . VITRECTOMY     Social History   Occupational History  . Occupation:  Engineer, production    Comment: retired  Tobacco Use  . Smoking status: Never Smoker  . Smokeless tobacco: Never Used  Substance and Sexual Activity  . Alcohol use: No    Alcohol/week: 0.0 standard drinks  . Drug use: No    Types: Oxycodone  . Sexual activity: Not Currently

## 2020-04-21 ENCOUNTER — Other Ambulatory Visit: Payer: Self-pay

## 2020-04-21 ENCOUNTER — Ambulatory Visit (INDEPENDENT_AMBULATORY_CARE_PROVIDER_SITE_OTHER): Payer: Medicare Other | Admitting: Pulmonary Disease

## 2020-04-21 ENCOUNTER — Encounter: Payer: Self-pay | Admitting: Pulmonary Disease

## 2020-04-21 VITALS — BP 150/86 | HR 65 | Temp 97.3°F | Ht 72.0 in | Wt 312.0 lb

## 2020-04-21 DIAGNOSIS — J449 Chronic obstructive pulmonary disease, unspecified: Secondary | ICD-10-CM

## 2020-04-21 DIAGNOSIS — G4733 Obstructive sleep apnea (adult) (pediatric): Secondary | ICD-10-CM

## 2020-04-21 NOTE — Progress Notes (Signed)
Synopsis: Referred by Shawnie Dapper, MD for COPD  Subjective:   PATIENT ID: Tim [name] GENDER: male DOB: 04-21-1941, MRN: [id number]   HPI  Chief Complaint  Patient presents with  . Consult    Denies sob, cough, or wheezing. Overdue for yearly check up with pulm.    Tim [name] is an 79 year old male, never smoker with ischemic cardiomyopathy with BiV ICD, aortic valve replacement, COPD and obstructive sleep apnea who is referred to pulmonary clinic to establish care.   He has been followed previously at [facility] pulmonary clinic. He has been on wixella inhub 250-18mg 1 puff twice daily and as needed albuterol. He denies any hospitalizations or need for prednisone for his breathing over the past 1 year. He uses CPAP nightly with a pressure setting of 8Carlysswith an AHI of 2.1 per the download from his visit on 07/2018 at [facility] He does have sinus congestion and drainage due to seasonal allergies and will use flonase for this.   He currently denies any issues with his breathing or sleep. He reports his respiratory issues are the least of his medical concerns at this time as he is dealing with a chronic Enterococcus faecalis infection of his aortic valve replacement and RV lead of the AICD. He is also dealing with a chronic wound of his left foot.    Past Medical History:  Diagnosis Date  . AICD (automatic cardioverter/defibrillator) present   . ASTHMA   . Asthma    as a child  . Balanitis    +severe phimosis+ buried penis->circ not possible but dorsal slit done 10/2019  . BPH (benign prostatic hypertrophy)    with urinary retention.  Renal u/s 12/04/19 NORMAL KIDNEYS, NORMAL BLADDER, NO HYDRONEPHROSIS  . CAD (coronary artery disease)    Nonobstructive by 12/2012 cath; then 03/2016 he required BMS to RCA ([facility]).  In-stent restenosis on cath 11/01/16, baloon angioplasty successful--pt to take plavix (no ASA), + eliquis (for PAF).  .Marland KitchenCATARACT, HX OF   . Chronic combined systolic and  diastolic CHF (congestive heart failure) ([place] 05/2016   Ischemic CM.  04/2018 EF 40-45%, grd I DD.  .Marland KitchenChronic pain syndrome    Lumbar DDD; chronic neuropathic pain (DM); has spinal stimulator and sees pain mgmt MD  . Complicated UTI (urinary tract infection) 09/2019   Phimoses, acute urinary retention, entoerococcus UTI, enterococc bacteremia.  F/u blood clx's neg x 5d.    .Marland KitchenCOPD   . Diabetes mellitus type 2 with complications (HCC)    HONG2Xjumped from 5.7% to 6.1% 03/2015---started metformin at that time.  DM 2 dx by fasting gluc criteria 2018.  Has chronic neuropath pain  . Elevated transaminase level 02/2016   Suspect due to fatty liver (documented on u/s 2007). Plan repeat labs 03/2016.  .Marland KitchenEnterococcal bacteremia 09/2019   Phimoses, acute urinary retention, entoerococcus UTI, enterococc bacteremia.  F/u blood clx's neg x 5d.    . Essential hypertension, benign   . Fatty liver 2007   2007 u/s showed fatty liver with hepatosplenomegaly.  2019 repeat u/s->fatty liver but no cirrhosis or hepatosplenomegaly.  . FUO (fever of unknown origin) 11/26/2019  . GERD (gastroesophageal reflux disease)   . Glaucoma   . GOUT   . Hallux valgus (acquired)   . HH (hiatus hernia)   . HYPERCHOLESTEROLEMIA-PURE   . Hypogonadism male   . ICD (implantable cardioverter-defibrillator) infection ([place] 12/22/2019  . 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 Tommy Medal)  . Lumbosacral neuritis   . Lumbosacral spondylosis    Lumbar spinal stenosis with neurogenic claudication--contributes to his chronic pain syndrome  . Normal memory function 08/2014   Neuropsychological testing ([facility]): no cognitive impairment or sign of neurodegenerative disorder.  Likely has adjustment d/o with mixed anxiety/depressed features and may benefit from low dose SNRI.    [name] Normocytic anemia 03/2016   Mild-pt needs ferritin  and vit B12 level checked (as of 03/22/16). Hb stable 09/2019  . NSTEMI (non-ST elevated myocardial infarction) ([place]) 03/20/2016   BMS to RCA  . Obesity hypoventilation syndrome ([place])   . Obesity, unspecified   . Orthostatic hypotension   . OSA on CPAP    8 cm H2O  . OSTEOARTHRITIS   . Paroxysmal atrial fibrillation ([place]) 2003    (? chronic?) Off anticoag for a while due to falls.  Then apixaban started 12/2014.  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 Ella Bodo): neuropathic pain->diff to treat, failed nucynta, failed spinal stimul trial, oxycontin hs + tramadol + gabap as of 12/2017 f/u Dr. Letta Pate.  . Personal history of colonic adenoma 10/30/2012   Diminutive adenoma, consider repeat 2019 per GI  . PFO (patent foramen ovale) 09/2019   small, with predominately L to R shunt  . Pneumonia   . Presence of cardiac defibrillator 11/07/2017  . Primary osteoarthritis of both knees    Bone on bone of medial compartments, + signif patellofemoral arth bilat.--supartz inj series started 09/12/17  . 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 PUD (peptic ulcer disease)   . PULMONARY HYPERTENSION, HX OF   . Secondary male hypogonadism 2017  . 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 Shortness of breath    with exertion: much improved s/p TAVR and treatment for CHF.  . Sick sinus syndrome (HCC)    PPM placed  . Thrombocytopenia ([place]) 2018   HSM on 2007 abd u/s---suspect some mild splenic sequestration chronically.  [name] Unspecified glaucoma(365.9)   . Unspecified hereditary and idiopathic peripheral neuropathy approx age 22   bilat LE's, ? left arm, too.  Feet became progressively numb + left foot pain intermittently.  Pt may be trying a spinal stimulator (as of 05/2015)  . VENOUS INSUFFICIENCY    Being followed by Dr. Sharol Given as of 10/2016 for two R LL venous stasis ulcers/skin tears.  Healed  as of 10/30/16 f/u with Dr. Sharol Given.  . VENTRAL HERNIA   . Wound dehiscence 10/22/2019     Family History  Problem Relation Age of Onset  . Hypertension Mother   . Coronary artery disease Mother   . Heart attack Mother   . Neuropathy Mother   . Pulmonary fibrosis Father        asbestosis     Social History   Socioeconomic History  . Marital status: Married    Spouse name: Not on file  . Number of children: 0  . Years of education: 34  . Highest education level: Not on file  Occupational History  . Occupation: Engineer, production    Comment: retired  Tobacco Use  . Smoking status: Never Smoker  . Smokeless tobacco: Never Used  Substance and Sexual Activity  . Alcohol use: No    Alcohol/week: 0.0 standard drinks  . Drug use: No    Types: Oxycodone  .  Sexual activity: Not Currently  Other Topics Concern  . Not on file  Social History Narrative   HSG, [name] - BS, [name] - MS-engineering, 2 years on PhD - [place]. Married - '65 - 42yr/divorced; '76- 3 yrs/divorced; '92 . No children. 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 ACP/Living Will - Yes CPR; long-term Mechanical ventilation as long as he was able to cognate; ok for long term artificial nutrition. Precondition being able to cognate and not to have too much pain.    Social Determinants of Health   Financial Resource Strain:   . Difficulty of Paying Living Expenses: Not on file  Food Insecurity:   . Worried About RCharity fundraiserin the Last Year: Not on file  . Ran Out of Food in the Last Year: Not on file  Transportation Needs:   . Lack of Transportation (Medical): Not on file  . Lack of Transportation (Non-Medical): Not on file  Physical Activity:   . Days of Exercise per Week: Not on file  . Minutes of Exercise per Session: Not on file  Stress:   . Feeling of Stress : Not on file  Social Connections:   . Frequency of Communication with Friends and Family: Not on  file  . Frequency of Social Gatherings with Friends and Family: Not on file  . Attends Religious Services: Not on file  . Active Member of Clubs or Organizations: Not on file  . Attends CArchivistMeetings: Not on file  . Marital Status: Not on file  Intimate Partner Violence:   . Fear of Current or Ex-Partner: Not on file  . Emotionally Abused: Not on file  . Physically Abused: Not on file  . Sexually Abused: Not on file     Allergies  Allergen Reactions  . Brimonidine Tartrate Shortness Of Breath    Alphagan-Shortness of breath  . Brinzolamide Shortness Of Breath    AZOPT- Shortness of breath  . Latanoprost Shortness Of Breath    XALATAN- Shortness of breath  . Nucynta [Tapentadol] Shortness Of Breath  . Sulfa Antibiotics Palpitations  . Timolol Maleate Shortness Of Breath and Other (See Comments)    TIMOPTIC- Aggravated asthma  . Diltiazem Swelling     leg swelling  . Rofecoxib Swelling     VIOXX- leg swelling  . Vancomycin Hives and Other (See Comments)    Possible "Red Man Syndrome"? > hives/blisters  . Codeine Other (See Comments)    Childhood reaction  . Tamsulosin Other (See Comments)    Dizziness   . Celecoxib Other (See Comments)    CELLBREX-confusion  . Colchicine Diarrhea    diarrhea  . Tape Rash     Outpatient Medications Prior to Visit  Medication Sig Dispense Refill  . albuterol (PROVENTIL HFA;VENTOLIN HFA) 108 (90 BASE) MCG/ACT inhaler Inhale 2 puffs into the lungs 4 (four) times daily as needed for wheezing or shortness of breath.    . allopurinol (ZYLOPRIM) 300 MG tablet TAKE 1 TABLET BY MOUTH ONCE DAILY WITH FOOD 90 tablet 1  . amoxicillin (AMOXIL) 500 MG capsule Take 1 capsule (500 mg total) by mouth 3 (three) times daily. 90 capsule 11  . apixaban (ELIQUIS) 5 MG TABS tablet Take 5 mg by mouth 2 (two) times daily.    . ASPIRIN LOW DOSE 81 MG EC tablet Take 81 mg by mouth daily.    . B Complex-C (B-COMPLEX WITH VITAMIN C) tablet  Take 1  tablet by mouth daily. Take 1 tablet daily, 279m    . ezetimibe (ZETIA) 10 MG tablet Take 1 tablet (10 mg total) by mouth daily. 30 tablet 1  . finasteride (PROSCAR) 5 MG tablet Take 1 tablet (5 mg total) by mouth daily. 90 tablet 0  . fluticasone (FLONASE) 50 MCG/ACT nasal spray Place 2 sprays into both nostrils as needed for allergies or rhinitis.    .Marland Kitchengabapentin (NEURONTIN) 800 MG tablet TAKE 1 TABLET (800 MG TOTAL) BY MOUTH 4 (FOUR) TIMES DAILY. 360 tablet 2  . KLOR-CON M20 20 MEQ tablet Take 20 mEq by mouth daily.    . metoprolol succinate (TOPROL-XL) 25 MG 24 hr tablet Take 25 mg by mouth daily.    . Multiple Vitamin (MULTIVITAMIN ADULT PO) Take by mouth daily.    . niacin (NIASPAN) 1000 MG CR tablet Take 1 tablet (1,000 mg total) by mouth 2 (two) times daily. 180 tablet 0  . pantoprazole (PROTONIX) 40 MG tablet TAKE 1 TABLET BY MOUTH EVERY DAY 90 tablet 3  . rosuvastatin (CRESTOR) 40 MG tablet take 1 tablet by mouth once daily (Patient taking differently: Take 40 mg by mouth daily. ) 15 tablet 0  . sacubitril-valsartan (ENTRESTO) 24-26 MG Take 1 tablet by mouth 2 (two) times daily.     .Marland Kitchentorsemide (DEMADEX) 20 MG tablet Take 20 mg by mouth 2 (two) times daily as needed (swelling).     . vitamin C (ASCORBIC ACID) 250 MG tablet Take 250 mg by mouth daily.    .Marland KitchenVITAMIN D, CHOLECALCIFEROL, PO Take 1 tablet by mouth daily.     .Grant RutsINHUB 250-50 MCG/DOSE AEPB TAKE 1 PUFF BY MOUTH TWICE A DAY 60 each 11  . cephALEXin (KEFLEX) 500 MG capsule Take 1 capsule (500 mg total) by mouth 3 (three) times daily. (Patient not taking: Reported on 04/21/2020) 30 capsule 0  . isosorbide mononitrate (IMDUR) 30 MG 24 hr tablet Take 30 mg by mouth daily.  (Patient not taking: Reported on 04/21/2020)    . nitroGLYCERIN (NITROSTAT) 0.4 MG SL tablet Place 0.4 mg under the tongue every 5 (five) minutes as needed for chest pain.  (Patient not taking: Reported on 04/21/2020)    . senna-docusate (SENOKOT-S) 8.6-50 MG  tablet Take 1 tablet by mouth 2 (two) times daily. While taking strong pain meds to prevent constipation. (Patient not taking: Reported on 04/21/2020) 10 tablet 0  . terbinafine (LAMISIL) 1 % cream Apply topically.  (Patient not taking: Reported on 04/21/2020)     No facility-administered medications prior to visit.    Review of Systems  Constitutional: Negative for chills, diaphoresis, fever, malaise/fatigue and weight loss.  HENT: Negative for congestion and sore throat.   Eyes: Negative for blurred vision.  Respiratory: Negative for cough, hemoptysis, sputum production, shortness of breath and wheezing.   Cardiovascular: Positive for leg swelling. Negative for chest pain, palpitations, orthopnea, claudication and PND.  Gastrointestinal: Negative for abdominal pain, constipation, heartburn, nausea and vomiting.  Genitourinary: Negative.   Musculoskeletal: Positive for back pain.  Skin: Negative for itching and rash.  Endo/Heme/Allergies: Negative.   Psychiatric/Behavioral: Negative.     Objective:   Vitals:   04/21/20 1515  BP: (!) 150/86  Pulse: 65  Temp: (!) 97.3 F (36.3 C)  TempSrc: Temporal  SpO2: 99%  Weight: (!) 312 lb (141.5 kg)  Height: 6' (1.829 m)     Physical Exam Constitutional:      Appearance: Normal appearance. He  is obese.  HENT:     Head: Normocephalic and atraumatic.     Nose: Nose normal.     Mouth/Throat:     Mouth: Mucous membranes are moist.     Pharynx: Oropharynx is clear.  Eyes:     General: No scleral icterus.    Conjunctiva/sclera: Conjunctivae normal.     Pupils: Pupils are equal, round, and reactive to light.  Cardiovascular:     Rate and Rhythm: Normal rate and regular rhythm.     Pulses: Normal pulses.     Heart sounds: Murmur (systolic) heard.   Pulmonary:     Effort: Pulmonary effort is normal.     Breath sounds: Normal breath sounds. No wheezing or rales.  Abdominal:     General: Bowel sounds are normal.     Palpations:  Abdomen is soft.  Musculoskeletal:     Cervical back: Neck supple.     Right lower leg: No edema.     Left lower leg: No edema.  Lymphadenopathy:     Cervical: No cervical adenopathy.  Neurological:     General: No focal deficit present.     Mental Status: He is alert and oriented to person, place, and time. Mental status is at baseline.  Psychiatric:        Mood and Affect: Mood normal.        Behavior: Behavior normal.        Thought Content: Thought content normal.        Judgment: Judgment normal.    CBC    Component Value Date/Time   WBC 6.3 12/06/2019 0417   RBC 3.25 (L) 12/06/2019 0417   HGB 9.4 (L) 12/06/2019 0417   HCT 30.5 (L) 12/06/2019 0417   PLT 139 (L) 12/06/2019 0417   MCV 93.8 12/06/2019 0417   MCH 28.9 12/06/2019 0417   MCHC 30.8 12/06/2019 0417   RDW 15.6 (H) 12/06/2019 0417   LYMPHSABS 0.9 12/06/2019 0417   MONOABS 0.7 12/06/2019 0417   EOSABS 0.1 12/06/2019 0417   BASOSABS 0.0 12/06/2019 0417   BMP Latest Ref Rng & Units 12/06/2019 12/05/2019 12/03/2019  Glucose 70 - 99 mg/dL 129(H) 125(H) 144(H)  BUN 8 - 23 mg/dL 6(L) 6(L) 8  Creatinine 0.61 - 1.24 mg/dL 0.78 0.78 0.65  BUN/Creat Ratio 6 - 22 (calc) - - -  Sodium 135 - 145 mmol/L 139 141 141  Potassium 3.5 - 5.1 mmol/L 3.7 3.7 4.5  Chloride 98 - 111 mmol/L 105 105 110  CO2 22 - 32 mmol/L 27 27 24   Calcium 8.9 - 10.3 mg/dL 8.1(L) 8.1(L) 8.3(L)    Chest imaging: 11/27/19 CXR Pacer. Midline trachea. Mild cardiomegaly. Atherosclerosis in the transverse aorta. No pleural effusion or pneumothorax. No congestive failure. Clear lungs.  PFT: 07/05/2017 FEV1/FVC 65 FEV1 85% FVC 84%  Labs: Reviewed as above  Echo: 01/16/20 1. Compared with the TEE 12/6438, systolic function is now reduced.  2. Left ventricular ejection fraction, by estimation, is 35 to 40%. The  left ventricle has moderately decreased function. The left ventricle  demonstrates global hypokinesis. The left ventricular internal cavity  size  was mildly dilated. Left ventricular  diastolic parameters are consistent with Grade I diastolic dysfunction  (impaired relaxation). Elevated left ventricular end-diastolic pressure.  3. Right ventricular systolic function is normal. The right ventricular  size is normal.  4. Left atrial size was severely dilated.  5. The mitral valve is normal in structure. No evidence of mitral valve  regurgitation. No  evidence of mitral stenosis.  6. The aortic valve has been repaired/replaced. Aortic valve  regurgitation is not visualized. Mild aortic valve stenosis. There is a 29  mm Edwards Sapien prosthetic (TAVR) valve present in the aortic position.  Aortic valve mean gradient measures 12.0  mmHg. Aortic valve Vmax measures 2.41 m/s.    Assessment & Plan:   Chronic obstructive pulmonary disease, unspecified COPD type ([place])  Obstructive sleep apnea - Plan: Home sleep test  Discussion: Tim [name] is an 79 year old male, never smoker with ischemic cardiomyopathy with BiV ICD, aortic valve replacement, COPD and obstructive sleep apnea who is referred to pulmonary clinic to establish care.  He is to continue wixella inhub 250-30mg q puff twice daily and as needed albuterol.  We will refer him to our sleep medicine team for further management of his OSA. We will order a home sleep study in order to requalify him for new CPAP machine and supplies.   He can follow up in 1 year.  JFreda Jackson MD [facility] Care Office: [phone]     Current Outpatient Medications:  .  albuterol (PROVENTIL HFA;VENTOLIN HFA) 108 (90 BASE) MCG/ACT inhaler, Inhale 2 puffs into the lungs 4 (four) times daily as needed for wheezing or shortness of breath., Disp: , Rfl:  .  allopurinol (ZYLOPRIM) 300 MG tablet, TAKE 1 TABLET BY MOUTH ONCE DAILY WITH FOOD, Disp: 90 tablet, Rfl: 1 .  amoxicillin (AMOXIL) 500 MG capsule, Take 1 capsule (500 mg total) by mouth 3 (three) times daily.,  Disp: 90 capsule, Rfl: 11 .  apixaban (ELIQUIS) 5 MG TABS tablet, Take 5 mg by mouth 2 (two) times daily., Disp: , Rfl:  .  ASPIRIN LOW DOSE 81 MG EC tablet, Take 81 mg by mouth daily., Disp: , Rfl:  .  B Complex-C (B-COMPLEX WITH VITAMIN C) tablet, Take 1 tablet by mouth daily. Take 1 tablet daily, 2573m Disp: , Rfl:  .  ezetimibe (ZETIA) 10 MG tablet, Take 1 tablet (10 mg total) by mouth daily., Disp: 30 tablet, Rfl: 1 .  finasteride (PROSCAR) 5 MG tablet, Take 1 tablet (5 mg total) by mouth daily., Disp: 90 tablet, Rfl: 0 .  fluticasone (FLONASE) 50 MCG/ACT nasal spray, Place 2 sprays into both nostrils as needed for allergies or rhinitis., Disp: , Rfl:  .  gabapentin (NEURONTIN) 800 MG tablet, TAKE 1 TABLET (800 MG TOTAL) BY MOUTH 4 (FOUR) TIMES DAILY., Disp: 360 tablet, Rfl: 2 .  KLOR-CON M20 20 MEQ tablet, Take 20 mEq by mouth daily., Disp: , Rfl:  .  metoprolol succinate (TOPROL-XL) 25 MG 24 hr tablet, Take 25 mg by mouth daily., Disp: , Rfl:  .  Multiple Vitamin (MULTIVITAMIN ADULT PO), Take by mouth daily., Disp: , Rfl:  .  niacin (NIASPAN) 1000 MG CR tablet, Take 1 tablet (1,000 mg total) by mouth 2 (two) times daily., Disp: 180 tablet, Rfl: 0 .  pantoprazole (PROTONIX) 40 MG tablet, TAKE 1 TABLET BY MOUTH EVERY DAY, Disp: 90 tablet, Rfl: 3 .  rosuvastatin (CRESTOR) 40 MG tablet, take 1 tablet by mouth once daily (Patient taking differently: Take 40 mg by mouth daily. ), Disp: 15 tablet, Rfl: 0 .  sacubitril-valsartan (ENTRESTO) 24-26 MG, Take 1 tablet by mouth 2 (two) times daily. , Disp: , Rfl:  .  torsemide (DEMADEX) 20 MG tablet, Take 20 mg by mouth 2 (two) times daily as needed (swelling). , Disp: , Rfl:  .  vitamin C (ASCORBIC  ACID) 250 MG tablet, Take 250 mg by mouth daily., Disp: , Rfl:  .  VITAMIN D, CHOLECALCIFEROL, PO, Take 1 tablet by mouth daily. , Disp: , Rfl:  .  WIXELA INHUB 250-50 MCG/DOSE AEPB, TAKE 1 PUFF BY MOUTH TWICE A DAY, Disp: 60 each, Rfl: 11 .  cephALEXin  (KEFLEX) 500 MG capsule, Take 1 capsule (500 mg total) by mouth 3 (three) times daily. (Patient not taking: Reported on 04/21/2020), Disp: 30 capsule, Rfl: 0 .  isosorbide mononitrate (IMDUR) 30 MG 24 hr tablet, Take 30 mg by mouth daily.  (Patient not taking: Reported on 04/21/2020), Disp: , Rfl:  .  nitroGLYCERIN (NITROSTAT) 0.4 MG SL tablet, Place 0.4 mg under the tongue every 5 (five) minutes as needed for chest pain.  (Patient not taking: Reported on 04/21/2020), Disp: , Rfl:  .  senna-docusate (SENOKOT-S) 8.6-50 MG tablet, Take 1 tablet by mouth 2 (two) times daily. While taking strong pain meds to prevent constipation. (Patient not taking: Reported on 04/21/2020), Disp: 10 tablet, Rfl: 0 .  terbinafine (LAMISIL) 1 % cream, Apply topically.  (Patient not taking: Reported on 04/21/2020), Disp: , Rfl:

## 2020-04-21 NOTE — Patient Instructions (Signed)
We will schedule you for follow up with on of our sleep specialists for the obstructive sleep apnea We will schedule you for home sleep study Continue daily Wixella use and as needed albuterol Follow up in 12 months, please call our clinic if any breathing symptoms were to worsen

## 2020-04-26 ENCOUNTER — Other Ambulatory Visit: Payer: Self-pay | Admitting: Family Medicine

## 2020-04-28 ENCOUNTER — Emergency Department (HOSPITAL_COMMUNITY): Payer: Medicare Other

## 2020-04-28 ENCOUNTER — Telehealth: Payer: Self-pay | Admitting: Orthopedic Surgery

## 2020-04-28 ENCOUNTER — Encounter (HOSPITAL_COMMUNITY): Payer: Self-pay

## 2020-04-28 ENCOUNTER — Other Ambulatory Visit: Payer: Self-pay

## 2020-04-28 ENCOUNTER — Inpatient Hospital Stay (HOSPITAL_COMMUNITY)
Admission: EM | Admit: 2020-04-28 | Discharge: 2020-05-07 | DRG: 871 | Disposition: A | Discharge status: Home Health | Payer: Medicare Other | Attending: Internal Medicine | Admitting: Internal Medicine

## 2020-04-28 DIAGNOSIS — I083 Combined rheumatic disorders of mitral, aortic and tricuspid valves: Secondary | ICD-10-CM | POA: Diagnosis present

## 2020-04-28 DIAGNOSIS — M109 Gout, unspecified: Secondary | ICD-10-CM | POA: Diagnosis present

## 2020-04-28 DIAGNOSIS — N400 Enlarged prostate without lower urinary tract symptoms: Secondary | ICD-10-CM | POA: Diagnosis present

## 2020-04-28 DIAGNOSIS — E7800 Pure hypercholesterolemia, unspecified: Secondary | ICD-10-CM | POA: Diagnosis present

## 2020-04-28 DIAGNOSIS — I5042 Chronic combined systolic (congestive) and diastolic (congestive) heart failure: Secondary | ICD-10-CM | POA: Diagnosis present

## 2020-04-28 DIAGNOSIS — Y831 Surgical operation with implant of artificial internal device as the cause of abnormal reaction of the patient, or of later complication, without mention of misadventure at the time of the procedure: Secondary | ICD-10-CM | POA: Diagnosis present

## 2020-04-28 DIAGNOSIS — Z8711 Personal history of peptic ulcer disease: Secondary | ICD-10-CM

## 2020-04-28 DIAGNOSIS — D638 Anemia in other chronic diseases classified elsewhere: Secondary | ICD-10-CM | POA: Diagnosis present

## 2020-04-28 DIAGNOSIS — E785 Hyperlipidemia, unspecified: Secondary | ICD-10-CM | POA: Diagnosis present

## 2020-04-28 DIAGNOSIS — I480 Paroxysmal atrial fibrillation: Secondary | ICD-10-CM | POA: Diagnosis present

## 2020-04-28 DIAGNOSIS — M170 Bilateral primary osteoarthritis of knee: Secondary | ICD-10-CM | POA: Diagnosis present

## 2020-04-28 DIAGNOSIS — J449 Chronic obstructive pulmonary disease, unspecified: Secondary | ICD-10-CM | POA: Diagnosis present

## 2020-04-28 DIAGNOSIS — A419 Sepsis, unspecified organism: Principal | ICD-10-CM | POA: Diagnosis present

## 2020-04-28 DIAGNOSIS — E1169 Type 2 diabetes mellitus with other specified complication: Secondary | ICD-10-CM | POA: Diagnosis present

## 2020-04-28 DIAGNOSIS — Z6841 Body Mass Index (BMI) 40.0 and over, adult: Secondary | ICD-10-CM

## 2020-04-28 DIAGNOSIS — Z91048 Other nonmedicinal substance allergy status: Secondary | ICD-10-CM

## 2020-04-28 DIAGNOSIS — I959 Hypotension, unspecified: Secondary | ICD-10-CM | POA: Diagnosis present

## 2020-04-28 DIAGNOSIS — E876 Hypokalemia: Secondary | ICD-10-CM | POA: Diagnosis not present

## 2020-04-28 DIAGNOSIS — M4727 Other spondylosis with radiculopathy, lumbosacral region: Secondary | ICD-10-CM | POA: Diagnosis present

## 2020-04-28 DIAGNOSIS — R5081 Fever presenting with conditions classified elsewhere: Secondary | ICD-10-CM | POA: Diagnosis present

## 2020-04-28 DIAGNOSIS — Z885 Allergy status to narcotic agent status: Secondary | ICD-10-CM

## 2020-04-28 DIAGNOSIS — Z89422 Acquired absence of other left toe(s): Secondary | ICD-10-CM

## 2020-04-28 DIAGNOSIS — H409 Unspecified glaucoma: Secondary | ICD-10-CM | POA: Diagnosis present

## 2020-04-28 DIAGNOSIS — R1314 Dysphagia, pharyngoesophageal phase: Secondary | ICD-10-CM | POA: Diagnosis present

## 2020-04-28 DIAGNOSIS — Q211 Atrial septal defect: Secondary | ICD-10-CM

## 2020-04-28 DIAGNOSIS — K219 Gastro-esophageal reflux disease without esophagitis: Secondary | ICD-10-CM | POA: Diagnosis present

## 2020-04-28 DIAGNOSIS — R7881 Bacteremia: Secondary | ICD-10-CM

## 2020-04-28 DIAGNOSIS — L97529 Non-pressure chronic ulcer of other part of left foot with unspecified severity: Secondary | ICD-10-CM | POA: Diagnosis present

## 2020-04-28 DIAGNOSIS — Z9049 Acquired absence of other specified parts of digestive tract: Secondary | ICD-10-CM

## 2020-04-28 DIAGNOSIS — Z8249 Family history of ischemic heart disease and other diseases of the circulatory system: Secondary | ICD-10-CM

## 2020-04-28 DIAGNOSIS — G894 Chronic pain syndrome: Secondary | ICD-10-CM | POA: Diagnosis present

## 2020-04-28 DIAGNOSIS — Z952 Presence of prosthetic heart valve: Secondary | ICD-10-CM

## 2020-04-28 DIAGNOSIS — G629 Polyneuropathy, unspecified: Secondary | ICD-10-CM | POA: Diagnosis present

## 2020-04-28 DIAGNOSIS — N179 Acute kidney failure, unspecified: Secondary | ICD-10-CM | POA: Diagnosis not present

## 2020-04-28 DIAGNOSIS — I059 Rheumatic mitral valve disease, unspecified: Secondary | ICD-10-CM

## 2020-04-28 DIAGNOSIS — I872 Venous insufficiency (chronic) (peripheral): Secondary | ICD-10-CM

## 2020-04-28 DIAGNOSIS — I330 Acute and subacute infective endocarditis: Secondary | ICD-10-CM | POA: Diagnosis present

## 2020-04-28 DIAGNOSIS — I255 Ischemic cardiomyopathy: Secondary | ICD-10-CM | POA: Diagnosis present

## 2020-04-28 DIAGNOSIS — Z7982 Long term (current) use of aspirin: Secondary | ICD-10-CM

## 2020-04-28 DIAGNOSIS — Z7901 Long term (current) use of anticoagulants: Secondary | ICD-10-CM

## 2020-04-28 DIAGNOSIS — K317 Polyp of stomach and duodenum: Secondary | ICD-10-CM | POA: Diagnosis present

## 2020-04-28 DIAGNOSIS — T827XXA Infection and inflammatory reaction due to other cardiac and vascular devices, implants and grafts, initial encounter: Secondary | ICD-10-CM | POA: Diagnosis present

## 2020-04-28 DIAGNOSIS — Z20822 Contact with and (suspected) exposure to covid-19: Secondary | ICD-10-CM | POA: Diagnosis present

## 2020-04-28 DIAGNOSIS — T826XXA Infection and inflammatory reaction due to cardiac valve prosthesis, initial encounter: Secondary | ICD-10-CM | POA: Diagnosis present

## 2020-04-28 DIAGNOSIS — I39 Endocarditis and heart valve disorders in diseases classified elsewhere: Secondary | ICD-10-CM

## 2020-04-28 DIAGNOSIS — E11621 Type 2 diabetes mellitus with foot ulcer: Secondary | ICD-10-CM | POA: Diagnosis present

## 2020-04-28 DIAGNOSIS — R509 Fever, unspecified: Secondary | ICD-10-CM | POA: Diagnosis not present

## 2020-04-28 DIAGNOSIS — I2510 Atherosclerotic heart disease of native coronary artery without angina pectoris: Secondary | ICD-10-CM | POA: Diagnosis present

## 2020-04-28 DIAGNOSIS — Z95810 Presence of automatic (implantable) cardiac defibrillator: Secondary | ICD-10-CM

## 2020-04-28 DIAGNOSIS — Z882 Allergy status to sulfonamides status: Secondary | ICD-10-CM

## 2020-04-28 DIAGNOSIS — I110 Hypertensive heart disease with heart failure: Secondary | ICD-10-CM | POA: Diagnosis present

## 2020-04-28 DIAGNOSIS — Z87440 Personal history of urinary (tract) infections: Secondary | ICD-10-CM

## 2020-04-28 DIAGNOSIS — I252 Old myocardial infarction: Secondary | ICD-10-CM

## 2020-04-28 DIAGNOSIS — Z79899 Other long term (current) drug therapy: Secondary | ICD-10-CM

## 2020-04-28 DIAGNOSIS — D696 Thrombocytopenia, unspecified: Secondary | ICD-10-CM | POA: Diagnosis present

## 2020-04-28 DIAGNOSIS — Z888 Allergy status to other drugs, medicaments and biological substances status: Secondary | ICD-10-CM

## 2020-04-28 DIAGNOSIS — M5136 Other intervertebral disc degeneration, lumbar region: Secondary | ICD-10-CM | POA: Diagnosis present

## 2020-04-28 DIAGNOSIS — Z66 Do not resuscitate: Secondary | ICD-10-CM | POA: Diagnosis present

## 2020-04-28 DIAGNOSIS — L97521 Non-pressure chronic ulcer of other part of left foot limited to breakdown of skin: Secondary | ICD-10-CM

## 2020-04-28 DIAGNOSIS — Z538 Procedure and treatment not carried out for other reasons: Secondary | ICD-10-CM | POA: Diagnosis present

## 2020-04-28 DIAGNOSIS — R1319 Other dysphagia: Secondary | ICD-10-CM

## 2020-04-28 DIAGNOSIS — E662 Morbid (severe) obesity with alveolar hypoventilation: Secondary | ICD-10-CM | POA: Diagnosis present

## 2020-04-28 DIAGNOSIS — Z23 Encounter for immunization: Secondary | ICD-10-CM

## 2020-04-28 LAB — BASIC METABOLIC PANEL
Anion gap: 10 (ref 5–15)
BUN: 23 mg/dL (ref 8–23)
CO2: 23 mmol/L (ref 22–32)
Calcium: 8.6 mg/dL — ABNORMAL LOW (ref 8.9–10.3)
Chloride: 107 mmol/L (ref 98–111)
Creatinine, Ser: 1.13 mg/dL (ref 0.61–1.24)
GFR, Estimated: >60 mL/min (ref >60)
Glucose, Bld: 161 mg/dL — ABNORMAL HIGH (ref 70–99)
Potassium: 4.6 mmol/L (ref 3.5–5.1)
Sodium: 140 mmol/L (ref 135–145)

## 2020-04-28 LAB — CBG MONITORING, ED: Glucose-Capillary: 161 mg/dL — ABNORMAL HIGH (ref 70–99)

## 2020-04-28 LAB — CBC WITH DIFFERENTIAL/PLATELET
Abs Immature Granulocytes: 0.09 10*3/uL — ABNORMAL HIGH (ref 0.00–0.07)
Basophils Absolute: 0 10*3/uL (ref 0.0–0.1)
Basophils Relative: 0 %
Eosinophils Absolute: 0.1 10*3/uL (ref 0.0–0.5)
Eosinophils Relative: 0 %
HCT: 36.6 % — ABNORMAL LOW (ref 39.0–52.0)
Hemoglobin: 11.7 g/dL — ABNORMAL LOW (ref 13.0–17.0)
Immature Granulocytes: 1 %
Lymphocytes Relative: 4 %
Lymphs Abs: 0.6 10*3/uL — ABNORMAL LOW (ref 0.7–4.0)
MCH: 29.8 pg (ref 26.0–34.0)
MCHC: 32 g/dL (ref 30.0–36.0)
MCV: 93.4 fL (ref 80.0–100.0)
Monocytes Absolute: 1.4 10*3/uL — ABNORMAL HIGH (ref 0.1–1.0)
Monocytes Relative: 9 %
Neutro Abs: 13.8 10*3/uL — ABNORMAL HIGH (ref 1.7–7.7)
Neutrophils Relative %: 86 %
Platelets: 106 10*3/uL — ABNORMAL LOW (ref 150–400)
RBC: 3.92 MIL/uL — ABNORMAL LOW (ref 4.22–5.81)
RDW: 14.7 % (ref 11.5–15.5)
WBC: 16 10*3/uL — ABNORMAL HIGH (ref 4.0–10.5)
nRBC: 0 % (ref 0.0–0.2)

## 2020-04-28 LAB — TROPONIN I (HIGH SENSITIVITY): Troponin I (High Sensitivity): 11 ng/L (ref <18)

## 2020-04-28 LAB — LACTIC ACID, PLASMA: Lactic Acid, Venous: 1.8 mmol/L (ref 0.5–1.9)

## 2020-04-28 MED ORDER — SODIUM CHLORIDE 0.9 % IV SOLN
2.0000 g | Freq: Once | INTRAVENOUS | Status: AC
  Administered 2020-04-28: 2 g via INTRAVENOUS
  Filled 2020-04-28: qty 20

## 2020-04-28 MED ORDER — SODIUM CHLORIDE 0.9 % IV BOLUS
1000.0000 mL | Freq: Once | INTRAVENOUS | Status: AC
  Administered 2020-04-28: 1000 mL via INTRAVENOUS

## 2020-04-28 MED ORDER — SODIUM CHLORIDE 0.9 % IV SOLN: 1.0000 g | Freq: Once | INTRAVENOUS | Status: DC

## 2020-04-28 MED ORDER — GABAPENTIN 400 MG PO CAPS
800.0000 mg | ORAL_CAPSULE | Freq: Once | ORAL | Status: AC
  Administered 2020-04-29: 800 mg via ORAL
  Filled 2020-04-28: qty 2

## 2020-04-28 MED ORDER — SODIUM CHLORIDE 0.9 % IV SOLN
2.0000 g | Freq: Once | INTRAVENOUS | Status: AC
  Administered 2020-04-29: 2 g via INTRAVENOUS
  Filled 2020-04-28 (×2): qty 2000

## 2020-04-28 NOTE — Telephone Encounter (Signed)
Can we change this order form an MRI to a CT scan due to his pacemaker?

## 2020-04-28 NOTE — ED Provider Notes (Signed)
[facility] EMERGENCY DEPARTMENT Provider Note   CSN: [id number] Arrival date & time: 04/28/20  2008     History Chief Complaint  Patient presents with   Weakness    Tim [name] is a 79 y.o. male.  Patient is currently in treatment for infected pacemaker leads and prosthetic valve endocarditis, followed by ID Tommy Medal). He reports onset of weakness and fever today. Dizziness with position change. Chest pain after a bowel movement, relieved with one NTG SL. No shortness of breath, cough, abdominal pain, nausea or vomiting. Patient fully vaccinated for COVID with Pfizer x 2 and booster.  The history is provided by the patient.  Weakness Severity:  Moderate Duration:  1 day Timing:  Constant Progression:  Worsening Context: recent infection   Associated symptoms: chest pain, dizziness and fever   Associated symptoms: no abdominal pain, no cough, no loss of consciousness, no near-syncope and no shortness of breath        Past Medical History:  Diagnosis Date   AICD (automatic cardioverter/defibrillator) present    ASTHMA    Asthma    as a child   Balanitis    +severe phimosis+ buried penis->circ not possible but dorsal slit done 10/2019   BPH (benign prostatic hypertrophy)    with urinary retention.  Renal u/s 12/04/19 NORMAL KIDNEYS, NORMAL BLADDER, NO HYDRONEPHROSIS   CAD (coronary artery disease)    Nonobstructive by 12/2012 cath; then 03/2016 he required BMS to RCA ([facility]).  In-stent restenosis on cath 11/01/16, baloon angioplasty successful--pt to take plavix (no ASA), + eliquis (for PAF).   CATARACT, HX OF    Chronic combined systolic and diastolic CHF (congestive heart failure) ([place]) 05/2016   Ischemic CM.  04/2018 EF 40-45%, grd I DD.   Chronic pain syndrome    Lumbar DDD; chronic neuropathic pain (DM); has spinal stimulator and sees pain mgmt MD   Complicated UTI (urinary tract infection) 09/2019   Phimoses, acute urinary retention,  entoerococcus UTI, enterococc bacteremia.  F/u blood clx's neg x 5d.     COPD    Diabetes mellitus type 2 with complications ([place])    TMH9Q jumped from 5.7% to 6.1% 03/2015---started metformin at that time.  DM 2 dx by fasting gluc criteria 2018.  Has chronic neuropath pain   Elevated transaminase level 02/2016   Suspect due to fatty liver (documented on u/s 2007). Plan repeat labs 03/2016.   Enterococcal bacteremia 09/2019   Phimoses, acute urinary retention, entoerococcus UTI, enterococc bacteremia.  F/u blood clx's neg x 5d.     Essential hypertension, benign    Fatty liver 2007   2007 u/s showed fatty liver with hepatosplenomegaly.  2019 repeat u/s->fatty liver but no cirrhosis or hepatosplenomegaly.   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 Tommy Medal)   Lumbosacral neuritis    Lumbosacral spondylosis    Lumbar spinal stenosis with neurogenic claudication--contributes to his chronic pain syndrome   Normal memory function 08/2014   Neuropsychological testing ([facility]): no cognitive impairment or sign of neurodegenerative disorder.  Likely has adjustment d/o with mixed anxiety/depressed features and may  benefit from low dose SNRI.     Normocytic anemia 03/2016   Mild-pt needs ferritin and vit B12 level checked (as of 03/22/16). Hb stable 09/2019   NSTEMI (non-ST elevated myocardial infarction) ([place]) 03/20/2016   BMS to RCA   Obesity hypoventilation syndrome (HCC)    Obesity, unspecified    Orthostatic hypotension    OSA on CPAP    8 cm H2O   OSTEOARTHRITIS    Paroxysmal  atrial fibrillation ([place]) 2003    (? chronic?) Off anticoag for a while due to falls.  Then apixaban started 12/2014.   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 Ella Bodo): neuropathic pain->diff to treat, failed nucynta, failed spinal stimul trial, oxycontin hs + tramadol + gabap as of 12/2017 f/u Dr. Letta Pate.   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   Sick sinus syndrome (HCC)    PPM placed   Thrombocytopenia ([place]) 2018   HSM on 2007 abd u/s---suspect some mild splenic sequestration chronically.   Unspecified glaucoma(365.9)    Unspecified hereditary and idiopathic peripheral neuropathy approx age 44   bilat LE's, ? left arm, too.  Feet became progressively numb + left foot pain intermittently.  Pt may be trying a spinal stimulator (as of 05/2015)   VENOUS INSUFFICIENCY    Being followed by Dr. Sharol Given as of 10/2016 for two R LL venous stasis ulcers/skin tears.  Healed as of 10/30/16 f/u with Dr. Sharol Given.   VENTRAL HERNIA    Wound dehiscence 10/22/2019    Patient Active Problem List   Diagnosis Date Noted   Cervical lymphadenopathy 03/15/2020   Prosthetic valve endocarditis ( [place]) 12/22/2019   ICD (implantable cardioverter-defibrillator) infection ([place])  12/22/2019   Gram-positive bacteremia 11/27/2019   FUO (fever of unknown origin) 11/26/2019   Low blood pressure reading 11/05/2019   Wound dehiscence 10/22/2019   PFO (patent foramen ovale) 10/06/2019   ICD (implantable cardioverter-defibrillator) battery depletion    Pressure injury of skin 09/13/2019   Enterococcal bacteremia history  09/13/2019   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   Urinary retention 09/13/2019   Phimosis 09/13/2019   SIRS (systemic inflammatory response syndrome) ([place]) 09/12/2019   Sepsis ([place]) 09/12/2019   Enterococcus UTI 09/12/2019   Cellulitis of foot    History of transmetatarsal amputation of left foot (HCC)    Subacute osteomyelitis, left ankle and foot ([place])    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 ([place]) 04/17/2018   Diabetic neuropathy, painful ([place]) 12/26/2016   Neuropathic pain of foot, left 06/20/2016   Insulin resistance 07/22/2015   Status post amputation of left great toe ([place]) 06/08/2015   Spinal stenosis of lumbar region  02/16/2015   Hereditary and idiopathic peripheral neuropathy 01/12/2015   Oral thrush 12/28/2014   Orthostatic dizziness 05/24/2014   Paresthesias with subjective weakness 01/30/2014   Bilateral thigh pain 01/30/2014   Abdominal wall pain in right upper quadrant 01/22/2014   Gross hematuria 09/25/2013   Intertrigo 09/25/2013   IBS (irritable bowel syndrome) 09/11/2013   Chronic pain syndrome 08/06/2013   CAD (coronary artery disease), native coronary artery 12/25/2012   Paroxysmal atrial fibrillation (HCC) 12/25/2012   Moderate aortic stenosis 12/25/2012   Aortic stenosis 12/24/2012   Personal history of colonic adenoma 10/30/2012   Diverticulosis of colon (without mention of hemorrhage) 10/30/2012   Internal hemorrhoids 10/30/2012    Change in bowel habits intermittent loose stools 10/30/2012   Routine health maintenance 06/09/2012   Hereditary peripheral neuropathy 10/10/2011   Long term current use of anticoagulant - apixiban 08/16/2010   Dyspnea on exertion 09/08/2009   HYPERCHOLESTEROLEMIA-PURE 01/13/2009   Essential hypertension, benign 01/13/2009   EDEMA 01/13/2009   GOUT 01/12/2009   GLAUCOMA 01/12/2009   Venous insufficiency of both lower extremities 01/12/2009   COPD (chronic obstructive pulmonary disease) ([place]) 01/12/2009   VENTRAL HERNIA 01/12/2009   OSTEOARTHRITIS 01/12/2009   ARTHRITIS 01/12/2009   DEGENERATIVE [place] DISEASE 01/12/2009   CATARACT, HX OF 01/12/2009   HEART MURMUR, HX OF 01/12/2009   BENIGN PROSTATIC HYPERTROPHY, HX OF 01/12/2009   Obesity 01/17/2007   OBSTRUCTIVE SLEEP APNEA 01/17/2007   ASTHMA 01/17/2007   GERD 01/17/2007   HALLUX VALGUS, ACQUIRED 01/17/2007   Pulmonary hypertension ([place]) 01/17/2007    Past Surgical History:  Procedure Laterality Date   AMPUTATION Left 04/11/2013   Procedure: AMPUTATION DIGIT Left 3rd toe;  Surgeon: [name], MD;  Location: [place];  Service: Orthopedics;  Laterality: Left;  Left 3rd toe amputation at [facility]   AMPUTATION Left 08/29/2019   Procedure: LEFT TRANSMETATARSAL AMPUTATION;  Surgeon: [name], MD;  Location: [place];  Service: Orthopedics;  Laterality: Left;   CARDIAC CATHETERIZATION  1997; 03/10/16   1997 Non-obstructive disease.  03/2016 BMS to RCA, with 25% pDiag dz, o/w normal cors per cath 03/07/16.  Cath 11/01/16: in stent restenosis, successful baloon angioplasty.   CARDIAC CATHETERIZATION  12/24/2012   mild < 20% LCx, prox 30% RCA; LVEF 55-65% , moderate pulmonary HTN, moderate AS   CARDIAC DEFIBRILLATOR PLACEMENT  11/07/2017   [facility] MRI Quad CRT defibrillator   CARDIOVASCULAR STRESS TEST  05/11/16 ([facility])   Myocardial perfusion imaging:  No ischemia; scar in apex, global hypokinesis, EF 36%.    Carotid dopplers  03/09/2016   [facility]: no hemodynamically significant stenosis on either side.   CHOLECYSTECTOMY     COLONOSCOPY     COLONOSCOPY [name] 10/30/2012   Procedure: COLONOSCOPY;  Surgeon: [name], MD;  Location: [facility];  Service: Endoscopy;  Laterality: [name];   CORONARY ANGIOPLASTY WITH STENT PLACEMENT  03/2016; 04/2017   2017-[facility]: BMS to RCA-pt was placed on Brilinta.  04/2017: DES to RCA.   DORSAL SLIT [name] 10/29/2019   for severe phimosis. Procedure: DORSAL SLIT;  Surgeon: Alexis Frock, MD;  Location: [facility];  Service: Urology;  Laterality: [name];  45 MINS   EYE SURGERY Bilateral cataract   HEMORRHOID SURGERY     INTRAOCULAR LENS INSERTION Bilateral    KNEE SURGERY Right    LEFT AND RIGHT HEART CATHETERIZATION WITH CORONARY ANGIOGRAM [name] 12/24/2012   Procedure: LEFT AND RIGHT HEART CATHETERIZATION WITH CORONARY ANGIOGRAM;  Surgeon: [name], MD;  Location: [facility]  CATH LAB;  Service: Cardiovascular;  Laterality: [name];   LEG SURGERY Bilateral    lenghtening    PACEMAKER PLACEMENT  04/13/2016   2nd deg HB after TAVR, pt had DC MDT PPM placed.   SHOULDER ARTHROSCOPY  08/30/2011   Procedure: ARTHROSCOPY SHOULDER;  Surgeon: [name], MD;  Location: [place];  Service: Orthopedics;  Laterality: Right;  Right Shoulder Arthroscopy, Debridement, and Decompression   SPINAL CORD STIMULATOR INSERTION [name] 09/10/2015   Procedure: LUMBAR SPINAL CORD STIMULATOR INSERTION;  Surgeon: [name], MD;  Location: [place] [facility];  Service: Neurosurgery;  Laterality: [name];   TEE WITHOUT CARDIOVERSION [name] 09/16/2019   Procedure: TRANSESOPHAGEAL ECHOCARDIOGRAM (TEE);  Surgeon: Skeet Latch, MD;  [facility] ENDOSCOPY;  Service: Cardiovascular;  Laterality: [name];   TEE WITHOUT CARDIOVERSION [name] 12/02/2019   +vegetation on AVR and pacer lead in RV.  EF 55-60%, normal wall motion.  Valves function normal.  Procedure: TRANSESOPHAGEAL ECHOCARDIOGRAM (TEE);  Surgeon: Geralynn Rile, MD;  Location: [place];  Service: Cardiovascular;  Laterality: [name];   TOE AMPUTATION Left    due to osteomyelitis.  R big toe surg due to osteoarth   TONSILLECTOMY     traeculectomy Left    eye   TRANSCATHETER AORTIC VALVE REPLACEMENT, TRANSFEMORAL  04/11/2016   TRANSESOPHAGEAL ECHOCARDIOGRAM  03/09/2016; 09/2019   [facility]: EF 55-60%, PFO seen with bi-directional shunting, no thrombus in appendage.  09/2019 ->no valvular vegetations. Small patent foramen ovale with predominantly left to right shunting across the interatrial septum.   TRANSTHORACIC ECHOCARDIOGRAM  01/2015; 01/2016; 05/18/16; 09/18/16, 05/2017, 08/2017   01/2015 No signif change in aortic stenosis (moderate).  01/2016 Severe LVH w/small LV cavity, EF 60-65%, grade I diast dysfxn.  05/2016 (s/p TAVR): EF 50-55%, grd I DD, biopros AV good.  08/2016--EF 50-55%, LV septal motion c/w conduction abnl, grd I DD,mild MS,bioprosth aortic valve well seated, w/trace AR. 05/2017 TTE EF 35%. 08/2017-EF 35%, mod diff hypokin LV, grd I DD, biopros AV good.    TRANSTHORACIC ECHOCARDIOGRAM  04/2018; 09/2019   04/2018: EF 40-45%, mod diffuse LV hypokinesis, grd I DD, bioprosth AV well seated, no AS or AR. 09/2019 EF 60-65%, grd I DD, valves fine, including bioprosth AV   VITRECTOMY         Family History  Problem Relation Age of Onset   Hypertension Mother    Coronary artery disease Mother    Heart attack Mother    Neuropathy Mother    Pulmonary fibrosis Father        asbestosis    Social History   Tobacco Use   Smoking status: Never Smoker   Smokeless tobacco: Never Used  Substance Use Topics   Alcohol use: No    Alcohol/week: 0.0 standard drinks   Drug use: No    Types: Oxycodone    Home Medications Prior to Admission medications   Medication Sig Start Date End Date Taking? Authorizing Provider  albuterol (PROVENTIL HFA;VENTOLIN HFA) 108 (90 BASE) MCG/ACT inhaler Inhale 2 puffs into the lungs 4 (four)  times daily as needed for wheezing or shortness of breath.    [provider]  allopurinol (ZYLOPRIM) 300 MG tablet TAKE 1 TABLET BY MOUTH ONCE DAILY WITH FOOD 12/09/19   McGowen, Adrian Blackwater, MD  amoxicillin (AMOXIL) 500 MG capsule Take 1 capsule (500 mg total) by mouth 3 (three) times daily. 12/22/19   Truman Hayward, MD  apixaban (ELIQUIS) 5 MG TABS tablet Take 5 mg by mouth 2 (two)  times daily.    [provider]  ASPIRIN LOW DOSE 81 MG EC tablet Take 81 mg by mouth daily. 02/03/19   [provider]  B Complex-C (B-COMPLEX WITH VITAMIN C) tablet Take 1 tablet by mouth daily. Take 1 tablet daily, 242m    [provider]  cephALEXin (KEFLEX) 500 MG capsule Take 1 capsule (500 mg total) by mouth 3 (three) times daily. Patient not taking: Reported on 04/21/2020 02/10/20   ZSuzan Slick NP  ezetimibe (ZETIA) 10 MG tablet Take 1 tablet (10 mg total) by mouth daily. 12/16/19   McGowen, PAdrian Blackwater MD  finasteride (PROSCAR) 5 MG tablet Take 1 tablet (5 mg total) by mouth daily. 12/10/19   McGowen, PAdrian Blackwater MD  fluticasone (FLONASE) 50 MCG/ACT nasal spray Place 2 sprays into both nostrils as needed for allergies or rhinitis.    [provider]  gabapentin (NEURONTIN) 800 MG tablet TAKE 1 TABLET (800 MG TOTAL) BY MOUTH 4 (FOUR) TIMES DAILY. 01/14/20   Kirsteins, ALuanna Salk MD  isosorbide mononitrate (IMDUR) 30 MG 24 hr tablet Take 30 mg by mouth daily.  Patient not taking: Reported on 04/21/2020    [provider]  KLOR-CON M20 20 MEQ tablet Take 20 mEq by mouth daily. 03/07/20   [provider]  metoprolol succinate (TOPROL-XL) 25 MG 24 hr tablet Take 25 mg by mouth daily. 12/17/19   [provider]  Multiple Vitamin (MULTIVITAMIN ADULT PO) Take by mouth daily.    [provider]  niacin (NIASPAN) 1000 MG CR tablet TAKE 1 TABLET BY MOUTH TWICE A DAY 04/26/20   McGowen, PAdrian Blackwater MD  nitroGLYCERIN (NITROSTAT) 0.4 MG SL tablet Place  0.4 mg under the tongue every 5 (five) minutes as needed for chest pain.  Patient not taking: Reported on 04/21/2020 09/04/17   [provider]  pantoprazole (PROTONIX) 40 MG tablet TAKE 1 TABLET BY MOUTH EVERY DAY 01/09/20   McGowen, PAdrian Blackwater MD  rosuvastatin (CRESTOR) 40 MG tablet take 1 tablet by mouth once daily Patient taking differently: Take 40 mg by mouth daily.  02/05/17   CLelon Perla MD  sacubitril-valsartan (ENTRESTO) 24-26 MG Take 1 tablet by mouth 2 (two) times daily.  07/19/17   [provider]  senna-docusate (SENOKOT-S) 8.6-50 MG tablet Take 1 tablet by mouth 2 (two) times daily. While taking strong pain meds to prevent constipation. Patient not taking: Reported on 04/21/2020 10/29/19   MAlexis Frock MD  terbinafine (LAMISIL) 1 % cream Apply topically.  Patient not taking: Reported on 04/21/2020    [provider]  torsemide (DEMADEX) 20 MG tablet Take 20 mg by mouth 2 (two) times daily as needed (swelling).     [provider]  vitamin C (ASCORBIC ACID) 250 MG tablet Take 250 mg by mouth daily.    [provider]  VITAMIN D, CHOLECALCIFEROL, PO Take 1 tablet by mouth daily.     [provider]  WGrant RutsINHUB 250-50 MCG/DOSE AEPB TAKE 1 PUFF BY MOUTH TWICE A DAY 02/17/20   McGowen, PAdrian Blackwater MD    Allergies    Brimonidine tartrate, Brinzolamide, Latanoprost, Nucynta [tapentadol], Sulfa antibiotics, Timolol maleate, Diltiazem, Rofecoxib, Vancomycin, Codeine, Tamsulosin, Celecoxib, Colchicine, and Tape  Review of Systems   Review of Systems  Constitutional: Positive for chills, fatigue and fever.  Respiratory: Negative for cough and shortness of breath.   Cardiovascular: Positive for chest pain. Negative for near-syncope.  Gastrointestinal: Negative for abdominal pain.  Neurological:  Positive for dizziness and weakness. Negative for loss of consciousness.  All other systems reviewed and are negative.   Physical  Exam Updated Vital Signs BP (!) 124/42 (BP Location: Right Arm)    Pulse (!) 103    Temp (!) 101.8 F (38.8 C) (Oral)    Resp (!) 25    Ht 6' (1.829 m)    Wt (!) 145.2 kg    SpO2 99%    BMI 43.40 kg/m   Physical Exam Vitals and nursing note reviewed.  Constitutional:      Appearance: He is obese.  HENT:     Head: Normocephalic.     Nose: Nose normal.     Mouth/Throat:     Mouth: Mucous membranes are moist.  Cardiovascular:     Rate and Rhythm: Tachycardia present.  Pulmonary:     Effort: Pulmonary effort is normal.     Breath sounds: Normal breath sounds.  Abdominal:     General: Bowel sounds are normal.     Palpations: Abdomen is soft.  Musculoskeletal:        General: Normal range of motion.  Skin:    General: Skin is warm and dry.  Neurological:     Mental Status: He is alert and oriented to person, place, and time.  Psychiatric:        Mood and Affect: Mood normal.       ED Results / Procedures / Treatments   Labs (all labs ordered are listed, but only abnormal results are displayed) Labs Reviewed  BASIC METABOLIC PANEL - Abnormal; Notable for the following components:      Result Value   Glucose, Bld 161 (*)    Calcium 8.6 (*)    All other components within normal limits  CBC WITH DIFFERENTIAL/PLATELET - Abnormal; Notable for the following components:   WBC 16.0 (*)    RBC 3.92 (*)    Hemoglobin 11.7 (*)    HCT 36.6 (*)    Platelets 106 (*)    Neutro Abs 13.8 (*)    Lymphs Abs 0.6 (*)    Monocytes Absolute 1.4 (*)    Abs Immature Granulocytes 0.09 (*)    All other components within normal limits  CBG MONITORING, ED - Abnormal; Notable for the following components:   Glucose-Capillary 161 (*)    All other components within normal limits  RESPIRATORY PANEL BY RT PCR (FLU A&B, COVID)  URINE CULTURE  LACTIC ACID, PLASMA  URINALYSIS, ROUTINE W REFLEX MICROSCOPIC  TROPONIN I (HIGH SENSITIVITY)  TROPONIN I (HIGH SENSITIVITY)    EKG EKG  Interpretation  Date/Time:  Wednesday April 28 2020 20:19:54 EDT Ventricular Rate:  101 PR Interval:    QRS Duration: 144 QT Interval:  368 QTC Calculation: 477 R Axis:   -89 Text Interpretation: Sinus tachycardia Nonspecific IVCD with LAD Inferior infarct, acute (RCA) Anterior infarct, old Probable RV involvement, suggest recording right precordial leads paced, unchanged from previous Confirmed by Wandra Arthurs ([id number]) on 04/28/2020 8:22:45 PM   Radiology DG Chest Port 1 View  Result Date: 04/28/2020 CLINICAL DATA:  Chest pain, weakness and dizziness. EXAM: PORTABLE CHEST 1 VIEW COMPARISON:  01/20/2020 FINDINGS: Mild patient rotation. Multi lead left-sided pacemaker. Cardiomegaly slightly increased from prior exam. Unchanged mediastinal contours. Aortic atherosclerosis. Mild vascular engorgement. No pulmonary edema. No focal airspace disease. No large pleural effusion. No pneumothorax. The bones appear under mineralized. Spinal stimulator in place. Multiple overlying monitoring devices. No acute osseous abnormalities are seen. IMPRESSION: Cardiomegaly with  mild vascular congestion. Electronically Signed   By: Keith Rake M.D.   On: 04/28/2020 21:40    Procedures Procedures (including critical care time)  Medications Ordered in ED Medications  ampicillin (OMNIPEN) 2 g in sodium chloride 0.9 % 100 mL IVPB (has no administration in time range)  cefTRIAXone (ROCEPHIN) 2 g in sodium chloride 0.9 % 100 mL IVPB (2 g Intravenous New Bag/Given 04/28/20 2240)  sodium chloride 0.9 % bolus 1,000 mL (1,000 mLs Intravenous New Bag/Given 04/28/20 2121)    ED Course  I have reviewed the triage vital signs and the nursing notes.  Pertinent labs & imaging results that were available during my care of the patient were reviewed by me and considered in my medical decision making (see chart for details).  Patient with fever and weakness. Known history of endocarditis due to infected pacemaker  leads. Is followed by infectious disease Tommy Medal) and on daily amoxicillin. Patient with small ulceration of bottom of left foot that does not appear to be source of infection. WBC elevated at 16, lactate of 1.8. Urinalysis pending. Treated with IV fluids, started on rocephin and ampicillin. Will request admission.   MDM Rules/Calculators/A&P                           Final Clinical Impression(s) / ED Diagnoses Final diagnoses:  Fever in other diseases  Endocarditis due to other organism, unspecified chronicity    Rx / DC Orders ED Discharge Orders    None       Etta Quill, NP 04/28/20 2341    Drenda Freeze, MD 05/01/20 1515

## 2020-04-28 NOTE — Telephone Encounter (Signed)
Pt called about his MRI he cant have it done due to him having a pacemaker.[name]

## 2020-04-28 NOTE — Telephone Encounter (Signed)
Order changed.

## 2020-04-28 NOTE — ED Triage Notes (Signed)
Per ems they got a call for weakness and dizziness. When they arrived they found patient on his bedside commode after attempting to poop, pale and diaphoretic. Pt c/o weakness and dizziness all day that's been getting worse and chest pain. Patient took his nitro that made his chest pain go away. Pt states he has a blood infection and taking antibiotics  for it. Pt does have a pacemaker. Pt is alert and oriented x4.

## 2020-04-29 ENCOUNTER — Encounter (HOSPITAL_COMMUNITY): Payer: Self-pay | Admitting: Internal Medicine

## 2020-04-29 ENCOUNTER — Inpatient Hospital Stay (HOSPITAL_COMMUNITY): Payer: Medicare Other

## 2020-04-29 ENCOUNTER — Observation Stay (HOSPITAL_COMMUNITY): Payer: Medicare Other

## 2020-04-29 DIAGNOSIS — K317 Polyp of stomach and duodenum: Secondary | ICD-10-CM | POA: Diagnosis not present

## 2020-04-29 DIAGNOSIS — K219 Gastro-esophageal reflux disease without esophagitis: Secondary | ICD-10-CM | POA: Diagnosis present

## 2020-04-29 DIAGNOSIS — Z23 Encounter for immunization: Secondary | ICD-10-CM | POA: Diagnosis not present

## 2020-04-29 DIAGNOSIS — E662 Morbid (severe) obesity with alveolar hypoventilation: Secondary | ICD-10-CM | POA: Diagnosis not present

## 2020-04-29 DIAGNOSIS — Z20822 Contact with and (suspected) exposure to covid-19: Secondary | ICD-10-CM | POA: Diagnosis not present

## 2020-04-29 DIAGNOSIS — Z6841 Body Mass Index (BMI) 40.0 and over, adult: Secondary | ICD-10-CM | POA: Diagnosis not present

## 2020-04-29 DIAGNOSIS — M109 Gout, unspecified: Secondary | ICD-10-CM | POA: Diagnosis present

## 2020-04-29 DIAGNOSIS — M5136 Other intervertebral disc degeneration, lumbar region: Secondary | ICD-10-CM | POA: Diagnosis present

## 2020-04-29 DIAGNOSIS — N179 Acute kidney failure, unspecified: Secondary | ICD-10-CM | POA: Diagnosis not present

## 2020-04-29 DIAGNOSIS — R509 Fever, unspecified: Secondary | ICD-10-CM | POA: Diagnosis present

## 2020-04-29 DIAGNOSIS — Q211 Atrial septal defect: Secondary | ICD-10-CM | POA: Diagnosis not present

## 2020-04-29 DIAGNOSIS — I083 Combined rheumatic disorders of mitral, aortic and tricuspid valves: Secondary | ICD-10-CM | POA: Diagnosis present

## 2020-04-29 DIAGNOSIS — E7800 Pure hypercholesterolemia, unspecified: Secondary | ICD-10-CM | POA: Diagnosis present

## 2020-04-29 DIAGNOSIS — T826XXA Infection and inflammatory reaction due to cardiac valve prosthesis, initial encounter: Secondary | ICD-10-CM | POA: Diagnosis not present

## 2020-04-29 DIAGNOSIS — R079 Chest pain, unspecified: Secondary | ICD-10-CM | POA: Diagnosis not present

## 2020-04-29 DIAGNOSIS — G894 Chronic pain syndrome: Secondary | ICD-10-CM | POA: Diagnosis present

## 2020-04-29 DIAGNOSIS — R1310 Dysphagia, unspecified: Secondary | ICD-10-CM | POA: Diagnosis not present

## 2020-04-29 DIAGNOSIS — T827XXD Infection and inflammatory reaction due to other cardiac and vascular devices, implants and grafts, subsequent encounter: Secondary | ICD-10-CM | POA: Diagnosis not present

## 2020-04-29 DIAGNOSIS — L97521 Non-pressure chronic ulcer of other part of left foot limited to breakdown of skin: Secondary | ICD-10-CM | POA: Diagnosis not present

## 2020-04-29 DIAGNOSIS — R7881 Bacteremia: Secondary | ICD-10-CM | POA: Diagnosis not present

## 2020-04-29 DIAGNOSIS — I110 Hypertensive heart disease with heart failure: Secondary | ICD-10-CM | POA: Diagnosis present

## 2020-04-29 DIAGNOSIS — A419 Sepsis, unspecified organism: Secondary | ICD-10-CM | POA: Diagnosis not present

## 2020-04-29 DIAGNOSIS — R5081 Fever presenting with conditions classified elsewhere: Secondary | ICD-10-CM | POA: Diagnosis present

## 2020-04-29 DIAGNOSIS — I2510 Atherosclerotic heart disease of native coronary artery without angina pectoris: Secondary | ICD-10-CM | POA: Diagnosis present

## 2020-04-29 DIAGNOSIS — Z954 Presence of other heart-valve replacement: Secondary | ICD-10-CM

## 2020-04-29 DIAGNOSIS — J449 Chronic obstructive pulmonary disease, unspecified: Secondary | ICD-10-CM | POA: Diagnosis present

## 2020-04-29 DIAGNOSIS — N400 Enlarged prostate without lower urinary tract symptoms: Secondary | ICD-10-CM | POA: Diagnosis present

## 2020-04-29 DIAGNOSIS — Y831 Surgical operation with implant of artificial internal device as the cause of abnormal reaction of the patient, or of later complication, without mention of misadventure at the time of the procedure: Secondary | ICD-10-CM | POA: Diagnosis present

## 2020-04-29 DIAGNOSIS — Z66 Do not resuscitate: Secondary | ICD-10-CM | POA: Diagnosis not present

## 2020-04-29 DIAGNOSIS — I872 Venous insufficiency (chronic) (peripheral): Secondary | ICD-10-CM | POA: Diagnosis not present

## 2020-04-29 DIAGNOSIS — I39 Endocarditis and heart valve disorders in diseases classified elsewhere: Secondary | ICD-10-CM | POA: Diagnosis not present

## 2020-04-29 DIAGNOSIS — J410 Simple chronic bronchitis: Secondary | ICD-10-CM | POA: Diagnosis not present

## 2020-04-29 DIAGNOSIS — Z952 Presence of prosthetic heart valve: Secondary | ICD-10-CM | POA: Diagnosis not present

## 2020-04-29 DIAGNOSIS — H409 Unspecified glaucoma: Secondary | ICD-10-CM | POA: Diagnosis present

## 2020-04-29 DIAGNOSIS — I330 Acute and subacute infective endocarditis: Secondary | ICD-10-CM

## 2020-04-29 DIAGNOSIS — I5042 Chronic combined systolic (congestive) and diastolic (congestive) heart failure: Secondary | ICD-10-CM | POA: Diagnosis not present

## 2020-04-29 DIAGNOSIS — Z95810 Presence of automatic (implantable) cardiac defibrillator: Secondary | ICD-10-CM

## 2020-04-29 DIAGNOSIS — T827XXA Infection and inflammatory reaction due to other cardiac and vascular devices, implants and grafts, initial encounter: Secondary | ICD-10-CM | POA: Diagnosis not present

## 2020-04-29 HISTORY — DX: Sepsis, unspecified organism: A41.9

## 2020-04-29 LAB — URINALYSIS, ROUTINE W REFLEX MICROSCOPIC
Bacteria, UA: NONE SEEN
Bilirubin Urine: NEGATIVE
Glucose, UA: NEGATIVE mg/dL
Hgb urine dipstick: NEGATIVE
Ketones, ur: NEGATIVE mg/dL
Leukocytes,Ua: NEGATIVE
Nitrite: NEGATIVE
Protein, ur: 30 mg/dL — AB
Specific Gravity, Urine: 1.026 (ref 1.005–1.030)
pH: 5 (ref 5.0–8.0)

## 2020-04-29 LAB — COMPREHENSIVE METABOLIC PANEL
ALT: 21 U/L (ref 0–44)
AST: 26 U/L (ref 15–41)
Albumin: 2.7 g/dL — ABNORMAL LOW (ref 3.5–5.0)
Alkaline Phosphatase: 48 U/L (ref 38–126)
Anion gap: 8 (ref 5–15)
BUN: 22 mg/dL (ref 8–23)
CO2: 23 mmol/L (ref 22–32)
Calcium: 8.3 mg/dL — ABNORMAL LOW (ref 8.9–10.3)
Chloride: 110 mmol/L (ref 98–111)
Creatinine, Ser: 1.09 mg/dL (ref 0.61–1.24)
GFR, Estimated: >60 mL/min (ref >60)
Glucose, Bld: 125 mg/dL — ABNORMAL HIGH (ref 70–99)
Potassium: 4 mmol/L (ref 3.5–5.1)
Sodium: 141 mmol/L (ref 135–145)
Total Bilirubin: 0.8 mg/dL (ref 0.3–1.2)
Total Protein: 5.5 g/dL — ABNORMAL LOW (ref 6.5–8.1)

## 2020-04-29 LAB — ECHOCARDIOGRAM COMPLETE
AV Mean grad: 16 mmHg
AV Peak grad: 30.9 mmHg
Ao pk vel: 2.78 m/s
Area-P 1/2: 2.05 cm2
Height: 72 in
Weight: 5120 oz

## 2020-04-29 LAB — CBC
HCT: 33.6 % — ABNORMAL LOW (ref 39.0–52.0)
Hemoglobin: 10.4 g/dL — ABNORMAL LOW (ref 13.0–17.0)
MCH: 29 pg (ref 26.0–34.0)
MCHC: 31 g/dL (ref 30.0–36.0)
MCV: 93.6 fL (ref 80.0–100.0)
Platelets: 99 10*3/uL — ABNORMAL LOW (ref 150–400)
RBC: 3.59 MIL/uL — ABNORMAL LOW (ref 4.22–5.81)
RDW: 14.7 % (ref 11.5–15.5)
WBC: 15 10*3/uL — ABNORMAL HIGH (ref 4.0–10.5)
nRBC: 0 % (ref 0.0–0.2)

## 2020-04-29 LAB — RESPIRATORY PANEL BY RT PCR (FLU A&B, COVID)
Influenza A by PCR: NEGATIVE
Influenza B by PCR: NEGATIVE
SARS Coronavirus 2 by RT PCR: NEGATIVE

## 2020-04-29 LAB — HEMOGLOBIN A1C
Hgb A1c MFr Bld: 6.1 % — ABNORMAL HIGH (ref 4.8–5.6)
Mean Plasma Glucose: 128 mg/dL

## 2020-04-29 LAB — TROPONIN I (HIGH SENSITIVITY)
Troponin I (High Sensitivity): 15 ng/L (ref <18)
Troponin I (High Sensitivity): 15 ng/L (ref <18)
Troponin I (High Sensitivity): 16 ng/L (ref <18)

## 2020-04-29 LAB — GLUCOSE, CAPILLARY
Glucose-Capillary: 103 mg/dL — ABNORMAL HIGH (ref 70–99)
Glucose-Capillary: 133 mg/dL — ABNORMAL HIGH (ref 70–99)

## 2020-04-29 LAB — MAGNESIUM: Magnesium: 1.4 mg/dL — ABNORMAL LOW (ref 1.7–2.4)

## 2020-04-29 LAB — CBG MONITORING, ED: Glucose-Capillary: 131 mg/dL — ABNORMAL HIGH (ref 70–99)

## 2020-04-29 LAB — D-DIMER, QUANTITATIVE: D-Dimer, Quant: 1.33 ug/mL-FEU — ABNORMAL HIGH (ref 0.00–0.50)

## 2020-04-29 LAB — TSH: TSH: 0.905 u[IU]/mL (ref 0.350–4.500)

## 2020-04-29 LAB — PROCALCITONIN: Procalcitonin: 0.77 ng/mL

## 2020-04-29 LAB — LACTIC ACID, PLASMA
Lactic Acid, Venous: 2 mmol/L (ref 0.5–1.9)
Lactic Acid, Venous: 1.3 mmol/L (ref 0.5–1.9)

## 2020-04-29 LAB — BRAIN NATRIURETIC PEPTIDE: B Natriuretic Peptide: 82.7 pg/mL (ref 0.0–100.0)

## 2020-04-29 MED ORDER — EZETIMIBE 10 MG PO TABS
10.0000 mg | ORAL_TABLET | Freq: Every day | ORAL | Status: DC
  Administered 2020-04-29 – 2020-05-07 (×8): 10 mg via ORAL
  Filled 2020-04-29 (×10): qty 1

## 2020-04-29 MED ORDER — ALBUTEROL SULFATE HFA 108 (90 BASE) MCG/ACT IN AERS: 2.0000 | INHALATION_SPRAY | Freq: Four times a day (QID) | RESPIRATORY_TRACT | Status: DC | PRN

## 2020-04-29 MED ORDER — INSULIN ASPART 100 UNIT/ML ~~LOC~~ SOLN
0.0000 [IU] | Freq: Three times a day (TID) | SUBCUTANEOUS | Status: DC
  Administered 2020-04-30 – 2020-05-05 (×6): 1 [IU] via SUBCUTANEOUS
  Administered 2020-05-05: 2 [IU] via SUBCUTANEOUS
  Administered 2020-05-06: 1 [IU] via SUBCUTANEOUS

## 2020-04-29 MED ORDER — SODIUM CHLORIDE 0.9 % IV SOLN: 2.0000 g | INTRAVENOUS | Status: DC

## 2020-04-29 MED ORDER — ALLOPURINOL 300 MG PO TABS
300.0000 mg | ORAL_TABLET | Freq: Every day | ORAL | Status: DC
  Filled 2020-04-29 (×2): qty 1

## 2020-04-29 MED ORDER — MAGNESIUM SULFATE 2 GM/50ML IV SOLN
2.0000 g | Freq: Once | INTRAVENOUS | Status: AC
  Administered 2020-04-29: 2 g via INTRAVENOUS
  Filled 2020-04-29: qty 50

## 2020-04-29 MED ORDER — ACETAMINOPHEN 325 MG PO TABS
650.0000 mg | ORAL_TABLET | Freq: Four times a day (QID) | ORAL | Status: DC | PRN
  Administered 2020-04-29: 650 mg via ORAL
  Filled 2020-04-29: qty 2

## 2020-04-29 MED ORDER — SODIUM CHLORIDE 0.9 % IV SOLN
2.0000 g | INTRAVENOUS | Status: DC
  Administered 2020-04-29 – 2020-05-07 (×48): 2 g via INTRAVENOUS
  Filled 2020-04-29: qty 2
  Filled 2020-04-29 (×2): qty 2000
  Filled 2020-04-29 (×3): qty 2
  Filled 2020-04-29: qty 2000
  Filled 2020-04-29 (×4): qty 2
  Filled 2020-04-29 (×2): qty 2000
  Filled 2020-04-29 (×11): qty 2
  Filled 2020-04-29: qty 2000
  Filled 2020-04-29 (×4): qty 2
  Filled 2020-04-29 (×2): qty 2000
  Filled 2020-04-29 (×8): qty 2
  Filled 2020-04-29: qty 2000
  Filled 2020-04-29 (×2): qty 2
  Filled 2020-04-29: qty 2000
  Filled 2020-04-29: qty 2
  Filled 2020-04-29: qty 2000
  Filled 2020-04-29 (×3): qty 2
  Filled 2020-04-29 (×2): qty 2000
  Filled 2020-04-29 (×3): qty 2

## 2020-04-29 MED ORDER — ASPIRIN 81 MG PO TBEC: 81.0000 mg | DELAYED_RELEASE_TABLET | Freq: Every day | ORAL | Status: DC

## 2020-04-29 MED ORDER — ALLOPURINOL 100 MG PO TABS: 50.0000 mg | ORAL_TABLET | Freq: Two times a day (BID) | ORAL | Status: DC

## 2020-04-29 MED ORDER — SODIUM CHLORIDE 0.9 % IV BOLUS
250.0000 mL | Freq: Once | INTRAVENOUS | Status: AC
  Administered 2020-04-29: 250 mL via INTRAVENOUS

## 2020-04-29 MED ORDER — SODIUM CHLORIDE 0.9 % IV BOLUS
2000.0000 mL | Freq: Once | INTRAVENOUS | Status: AC
  Administered 2020-04-29: 2000 mL via INTRAVENOUS

## 2020-04-29 MED ORDER — PANTOPRAZOLE SODIUM 40 MG PO TBEC: 40.0000 mg | DELAYED_RELEASE_TABLET | Freq: Every day | ORAL | Status: DC

## 2020-04-29 MED ORDER — LACTATED RINGERS IV SOLN: INTRAVENOUS | Status: DC

## 2020-04-29 MED ORDER — LACTATED RINGERS IV SOLN: INTRAVENOUS | Status: AC

## 2020-04-29 MED ORDER — APIXABAN 5 MG PO TABS
5.0000 mg | ORAL_TABLET | Freq: Two times a day (BID) | ORAL | Status: DC
  Administered 2020-04-29 – 2020-05-03 (×10): 5 mg via ORAL
  Filled 2020-04-29 (×10): qty 1

## 2020-04-29 MED ORDER — PANTOPRAZOLE SODIUM 40 MG PO TBEC
40.0000 mg | DELAYED_RELEASE_TABLET | Freq: Every day | ORAL | Status: DC
  Administered 2020-04-29 – 2020-05-07 (×9): 40 mg via ORAL
  Filled 2020-04-29 (×9): qty 1

## 2020-04-29 MED ORDER — ROSUVASTATIN CALCIUM 20 MG PO TABS: 40.0000 mg | ORAL_TABLET | Freq: Every day | ORAL | Status: DC

## 2020-04-29 MED ORDER — SODIUM CHLORIDE 0.9 % IV SOLN
2.0000 g | Freq: Four times a day (QID) | INTRAVENOUS | Status: DC
  Administered 2020-04-29: 2 g via INTRAVENOUS
  Filled 2020-04-29 (×2): qty 2000
  Filled 2020-04-29: qty 2

## 2020-04-29 MED ORDER — INFLUENZA VAC A&B SA ADJ QUAD 0.5 ML IM PRSY
0.5000 mL | PREFILLED_SYRINGE | INTRAMUSCULAR | Status: AC
  Administered 2020-04-30: 0.5 mL via INTRAMUSCULAR
  Filled 2020-04-29: qty 0.5

## 2020-04-29 MED ORDER — ALLOPURINOL 100 MG PO TABS
100.0000 mg | ORAL_TABLET | Freq: Two times a day (BID) | ORAL | Status: DC
  Administered 2020-04-29 – 2020-05-07 (×16): 100 mg via ORAL
  Filled 2020-04-29 (×18): qty 1

## 2020-04-29 MED ORDER — GABAPENTIN 400 MG PO CAPS
800.0000 mg | ORAL_CAPSULE | Freq: Four times a day (QID) | ORAL | Status: DC
  Administered 2020-04-29 – 2020-05-07 (×32): 800 mg via ORAL
  Filled 2020-04-29 (×33): qty 2

## 2020-04-29 MED ORDER — ROSUVASTATIN CALCIUM 20 MG PO TABS
40.0000 mg | ORAL_TABLET | Freq: Every day | ORAL | Status: DC
  Administered 2020-04-29 – 2020-05-06 (×9): 40 mg via ORAL
  Filled 2020-04-29 (×9): qty 2

## 2020-04-29 MED ORDER — EZETIMIBE 10 MG PO TABS: 10.0000 mg | ORAL_TABLET | Freq: Every day | ORAL | Status: DC

## 2020-04-29 MED ORDER — PERFLUTREN LIPID MICROSPHERE
1.0000 mL | INTRAVENOUS | Status: AC | PRN
  Administered 2020-04-29: 6 mL via INTRAVENOUS
  Filled 2020-04-29: qty 10

## 2020-04-29 MED ORDER — SODIUM CHLORIDE 0.9 % IV SOLN
2.0000 g | Freq: Two times a day (BID) | INTRAVENOUS | Status: DC
  Administered 2020-04-29 – 2020-05-07 (×17): 2 g via INTRAVENOUS
  Filled 2020-04-29: qty 2
  Filled 2020-04-29: qty 20
  Filled 2020-04-29: qty 2
  Filled 2020-04-29: qty 20
  Filled 2020-04-29: qty 2
  Filled 2020-04-29: qty 20
  Filled 2020-04-29 (×9): qty 2
  Filled 2020-04-29: qty 20
  Filled 2020-04-29 (×2): qty 2
  Filled 2020-04-29: qty 20

## 2020-04-29 MED ORDER — ACETAMINOPHEN 650 MG RE SUPP: 650.0000 mg | Freq: Four times a day (QID) | RECTAL | Status: DC | PRN

## 2020-04-29 MED ORDER — FINASTERIDE 5 MG PO TABS
5.0000 mg | ORAL_TABLET | Freq: Every day | ORAL | Status: DC
  Administered 2020-04-29 – 2020-05-07 (×8): 5 mg via ORAL
  Filled 2020-04-29 (×9): qty 1

## 2020-04-29 MED ORDER — METOPROLOL SUCCINATE ER 25 MG PO TB24
25.0000 mg | ORAL_TABLET | Freq: Every day | ORAL | Status: DC
  Administered 2020-04-30 – 2020-05-07 (×7): 25 mg via ORAL
  Filled 2020-04-29 (×8): qty 1

## 2020-04-29 NOTE — ED Notes (Signed)
MD Hal Hope notified of PT B/P systolic in low 02'B and MAP < 65.

## 2020-04-29 NOTE — Evaluation (Signed)
Physical Therapy Evaluation Patient Details Name: Tim [name] MRN: [id number] DOB: 1940-10-12 Today's Date: 04/29/2020   History of Present Illness  79 y.o. 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  Clinical Impression  Pt presents to PT with deficits in power, endurance, and cardiopulmonary function. Pt mobilizes well, not requiring physical assistance at this time to perform bed mobility, transfers, or ambulation. Pt does fatigue quickly, although he reports this is close to his baseline. Pt becomes tachycardic into 150s with mobility and demonstrates increased work of breathing with household ambulation distances. Pt will benefit from continued acute PT services and aggressive mobilization to improve activity tolerance and to maintain the pt's current level of function during this admission. PT recommends no PT or DME at the time of discharge.    Follow Up Recommendations No PT follow up;Supervision - Intermittent    Equipment Recommendations  None recommended by PT    Recommendations for Other Services       Precautions / Restrictions Precautions Precautions: Fall Restrictions Weight Bearing Restrictions: No      Mobility  Bed Mobility Overal bed mobility: Modified Independent Bed Mobility: Supine to Sit;Sit  to Supine     Supine to sit: Modified independent (Device/Increase time);HOB elevated Sit to supine: Modified independent (Device/Increase time);HOB elevated        Transfers Overall transfer level: Needs assistance Equipment used: Rolling walker (2 wheeled) Transfers: Sit to/from Stand Sit to Stand: Supervision         General transfer comment: pt performs 4 sit to stands to allow PT to adjust RW height to pt liking  Ambulation/Gait Ambulation/Gait assistance: Supervision Gait Distance (Feet): 100 Feet Assistive device: Rolling walker (2 wheeled) Gait Pattern/deviations: Step-through pattern Gait velocity: reduced Gait velocity interpretation: <1.8 ft/sec, indicate of risk for recurrent falls General Gait Details: pt with slowed step through gait, increase in trunk flexion with increased ambulation distances  Stairs            Wheelchair Mobility    Modified Rankin (Stroke Patients Only)       Balance Overall balance assessment: Needs assistance Sitting-balance support: No upper extremity supported;Feet supported Sitting balance-Leahy Scale: Good Sitting balance - Comments: supervision   Standing balance support: Single extremity supported Standing balance-Leahy Scale: Poor Standing balance comment: reliant on UE support of assistive device                             Pertinent Vitals/Pain Pain Assessment: Faces Faces Pain Scale: Hurts little more Pain Location: back Pain Descriptors / Indicators: Aching Pain Intervention(s): Monitored during session    Home Living Family/patient expects to be discharged to:: Private residence Living Arrangements: Spouse/significant other Available Help at Discharge: Family;Available 24 hours/day Type of Home: House Home Access: Stairs to enter Entrance Stairs-Rails: Left Entrance Stairs-Number of Steps: 4 Home Layout: Able to live  on main level with bedroom/bathroom Home Equipment: Walker - 2  wheels;Wheelchair - manual;Cane - single point;Walker - 4 wheels      Prior Function Level of Independence: Independent with assistive device(s)         Comments: pt reports he has progressed to ambulating with cane since last admission     Hand Dominance        Extremity/Trunk Assessment   Upper Extremity Assessment Upper Extremity Assessment: RUE deficits/detail RUE Deficits / Details: shoulder flexion limited to ~120 degrees 2/2 chronic rotator cuff injury, otherwise ROM and strength WFL    Lower Extremity Assessment Lower Extremity Assessment: RLE deficits/detail;LLE deficits/detail RLE Deficits / Details: pt with BLE swelling mid-calf down with coloration changes, chronic in nature per patient RLE Sensation: history of peripheral neuropathy LLE Deficits / Details: pt with BLE swelling mid-calf down with coloration changes, chronic in nature per patient LLE Sensation: history of peripheral neuropathy    Cervical / Trunk Assessment Cervical / Trunk Assessment: Normal  Communication   Communication: HOH  Cognition Arousal/Alertness: Awake/alert Behavior During Therapy: WFL for tasks assessed/performed Overall Cognitive Status: Within Functional Limits for tasks assessed                                        General Comments General comments (skin integrity, edema, etc.): pt intermittently tachy during session up to 153 observed by this PT. HR ranging from 70s at rest, 100-153 with mobility    Exercises     Assessment/Plan    PT Assessment Patient needs continued PT services  PT Problem List Decreased activity tolerance;Decreased balance;Decreased mobility;Cardiopulmonary status limiting activity       PT Treatment Interventions DME instruction;Gait training;Stair training;Functional mobility training;Balance training;Neuromuscular re-education;Therapeutic activities;Therapeutic exercise;Patient/family education    PT Goals (Current goals can  be found in the Care Plan section)  Acute Rehab PT Goals Patient Stated Goal: To maintain current level of function and return home PT Goal Formulation: With patient Time For Goal Achievement: 05/13/20 Potential to Achieve Goals: Good    Frequency Min 3X/week   Barriers to discharge        Co-evaluation               AM-PAC PT "6 Clicks" Mobility  Outcome Measure Help needed turning from your back to your side while in a flat bed without using bedrails?: None Help needed moving from lying on your back to sitting on the side of a flat bed without using bedrails?: None Help needed moving to and from a bed to a chair (including a wheelchair)?: None Help needed standing up from a chair using your arms (e.g., wheelchair or bedside chair)?: None Help needed to walk in hospital room?: None Help needed climbing 3-5 steps with a railing? : A Little 6 Click Score: 23    End of Session   Activity Tolerance: Patient tolerated treatment well Patient left: in bed;with call bell/phone within reach;with bed alarm set;with family/visitor present Nurse Communication: Mobility status PT Visit Diagnosis: Other abnormalities of gait and mobility (R26.89);Muscle weakness (generalized) (M62.81)    Time: 7106-2694 PT Time Calculation (min) (ACUTE ONLY): 23 min   Charges:   PT Evaluation $PT Eval Low Complexity: [place], PT, DPT Acute Rehabilitation Pager: [phone]   Zenaida Niece 04/29/2020, 3:41 PM

## 2020-04-29 NOTE — Evaluation (Signed)
Occupational Therapy Evaluation Patient Details Name: Tim [name] MRN: [id number] DOB: June 07, 1941 Today's Date: 04/29/2020    History of Present Illness 79 y.o. 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   Clinical Impression   Patient admitted with the above diagnosis.  Currently his biggest concern is an area on his L foot status post transmetatarsal amputation 8 months eariler.  Functionally he is at or near his baseline for ADL, toilet, and in room functional mobility (currently at RW level).  At home his wife completes home management and meal prep, he requires setup and distant supervision for showers/dressing, and is able to toilet himself with a SPC.  Outside of the home he will use a wheelchair for longer distances.  He does drive locally and cares for his own meds.  Patient and OT agree he does not have any acute needs, and will not require HH OT post acute.      Follow Up Recommendations  No OT follow up    Equipment Recommendations    Discussed LH Sponge   Recommendations for Other Services       Precautions / Restrictions Precautions Precautions: Fall Restrictions Weight Bearing Restrictions: No      Mobility Bed Mobility Overal bed mobility: Modified Independent Bed Mobility: Supine to Sit;Sit to  Supine     Supine to sit: Modified independent (Device/Increase time);HOB elevated Sit to supine: Modified independent (Device/Increase time);HOB elevated        Transfers Overall transfer level: Needs assistance Equipment used: Rolling walker (2 wheeled) Transfers: Sit to/from Stand Sit to Stand: Modified independent (Device/Increase time)         General transfer comment: side stepped to [facility] without deficits.    Balance Overall balance assessment: Needs assistance Sitting-balance support: No upper extremity supported;Feet supported Sitting balance-Leahy Scale: Good Sitting balance - Comments: supervision   Standing balance support: Single extremity supported Standing balance-Leahy Scale: Fair Standing balance comment: reliant on UE support of assistive device                           ADL either performed or assessed with clinical judgement   ADL Overall ADL's : At baseline                                       General ADL Comments: Patient is limited by line and leads, but is able to complete sit/stand self care with minimal setup.  Functional mobility currently with a RW.     Vision Baseline Vision/History: No visual deficits Patient Visual Report: No change from baseline       Perception     Praxis      Pertinent Vitals/Pain Pain Assessment: Faces Faces Pain Scale: Hurts a little bit Pain Location: back, chronic Pain  Descriptors / Indicators: Aching Pain Intervention(s): Monitored during session     Hand Dominance Right   Extremity/Trunk Assessment Upper Extremity Assessment Upper Extremity Assessment: RUE deficits/detail RUE Deficits / Details: chronic RCT about 10 years prior.  does not impact him functionally. RUE Sensation: WNL RUE Coordination: WNL     Cervical / Trunk Assessment Cervical / Trunk Assessment: Normal   Communication Communication Communication: No difficulties   Cognition Arousal/Alertness:  Awake/alert Behavior During Therapy: WFL for tasks assessed/performed Overall Cognitive Status: Within Functional Limits for tasks assessed                                     General Comments  pt HR ranging from 70s at rest to 87 with light mobility.      Exercises     Shoulder Instructions      Home Living Family/patient expects to be discharged to:: Private residence Living Arrangements: Spouse/significant other Available Help at Discharge: Family;Available 24 hours/day Type of Home: House Home Access: Stairs to enter [employer] of Steps: 4 Entrance Stairs-Rails: Left Home Layout: Able to live on main level with bedroom/bathroom;Multi-level     Bathroom Shower/Tub: Occupational psychologist: Handicapped height Bathroom Accessibility: Yes How Accessible: Accessible via walker Home Equipment: Ider - 2 wheels;Wheelchair - manual;Cane - single point;Walker - 4 wheels;Shower seat;Grab bars - tub/shower;Hand held shower head;Adaptive equipment Adaptive Equipment: Reacher;Long-handled shoe horn        Prior Functioning/Environment Level of Independence: Independent with assistive device(s)        Comments: pt reports he has progressed to ambulating with cane since last admission.  uses a wheelchair for longer distances.        OT Problem List: Decreased activity tolerance      OT Treatment/Interventions:      OT Goals(Current goals can be found in the care plan section) Acute Rehab OT Goals Patient Stated Goal: To return home and regain mobility at [facility] level. OT Goal Formulation: With patient Time For Goal Achievement: 05/10/20 Potential to Achieve Goals: Good  OT Frequency:     Barriers to D/C:  medical status          Co-evaluation              AM-PAC OT "6 Clicks" Daily Activity     Outcome Measure Help from another person eating meals?: None Help from another person taking care of personal grooming?: None Help  from another person toileting, which includes using toliet, bedpan, or urinal?: A Little Help from another person bathing (including washing, rinsing, drying)?: A Little Help from another person to put on and taking off regular upper body clothing?: A Little Help from another person to put on and taking off regular lower body clothing?: A Little 6 Click Score: 20   End of Session Nurse Communication: Other (comment) (IV beeping)  Activity Tolerance: Patient tolerated treatment well Patient left: in bed;with call bell/phone within reach;with bed alarm set;with family/visitor present  OT Visit Diagnosis: Unsteadiness on feet (R26.81)                Time: [id number] OT Time Calculation (min): 27 min Charges:  OT General Charges $OT Visit: 1 Visit OT Evaluation $OT Eval Moderate Complexity: 1 Mod  04/29/2020  Rich, OTR/L  Acute Rehabilitation Services  Office:  [phone]   Metta Clines 04/29/2020, 5:26 PM

## 2020-04-29 NOTE — Progress Notes (Signed)
Pt arrived to 4e from ED. Pt oriented to room and staff. Vitals obtained and stable. Telemetry box applied and CCMD notified x2 verifiers. CHG bath completed. Wife at bedside. Will continue current plan of care.

## 2020-04-29 NOTE — H&P (Signed)
History and Physical    Tim [name]:[id number] DOB: May 16, 1941 DOA: 04/28/2020  PCP: Tammi Sou, MD  Patient coming from: Home.  Chief Complaint: Fever and weakness.  HPI: Tim [name] is a 79 y.o. 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  2 days ago patient had 3-4 episodes of diarrhea but yesterday he had a normal bowel movement.  Denies any nausea vomiting has some sharp shooting chest pain which last for few minutes resolved at this time.  Denies any productive cough or shortness of breath.  ED Course: In the ER patient was afebrile but blood pressure was in the low normal.  Initial lactic acid was normal but repeat one was 2.  WBC count 16 hemoglobin 9.7 platelets 106 high sensitive troponins were negative EKG showing sinus tachycardia.  Patient had a blood cultures drawn and patient was started on ceftriaxone and ampicillin by ER physician to cover the previous blood cultures.  At the time of my exam patient blood pressure was slightly on the lower side for which I ordered a fluid bolus and admitted for sepsis source not clear.  On exam patient has a ulceration on the left stump which patient states off and on bleeds.  I have ordered a CT of the left foot stump.  Review of Systems: As per HPI, rest all negative.   Past Medical History:  Diagnosis Date  . AICD (automatic cardioverter/defibrillator) present   . ASTHMA   . Asthma     as a child  . Balanitis    +severe phimosis+ buried penis->circ not possible but dorsal slit done 10/2019  . BPH (benign prostatic hypertrophy)    with urinary retention.  Renal u/s 12/04/19 NORMAL KIDNEYS, NORMAL BLADDER, NO HYDRONEPHROSIS  . CAD (coronary artery disease)    Nonobstructive by 12/2012 cath; then 03/2016 he required BMS to RCA ([facility]).  In-stent restenosis on cath 11/01/16, baloon angioplasty successful--pt to take plavix (no ASA), + eliquis (for PAF).  [name] CATARACT, HX OF   . Chronic combined systolic and diastolic CHF (congestive heart failure) ([place]) 05/2016   Ischemic CM.  04/2018 EF 40-45%, grd I DD.  [name] Chronic pain syndrome    Lumbar DDD; chronic neuropathic pain (DM); has spinal stimulator and sees pain mgmt MD  . Complicated UTI (urinary tract infection) 09/2019   Phimoses, acute urinary retention, entoerococcus UTI, enterococc bacteremia.  F/u blood clx's neg x 5d.    [name] COPD   . Diabetes mellitus type 2 with complications (HCC)    [id number] jumped from 5.7% to 6.1% 03/2015---started metformin at that time.  DM 2 dx by fasting gluc criteria 2018.  Has chronic neuropath pain  . Elevated transaminase level 02/2016   Suspect due to fatty liver (documented on u/s 2007). Plan repeat labs 03/2016.  [name] Enterococcal bacteremia 09/2019   Phimoses, acute urinary retention, entoerococcus UTI, enterococc bacteremia.  F/u blood clx's neg x 5d.    . Essential hypertension, benign   . Fatty liver 2007   2007 u/s showed fatty  liver with hepatosplenomegaly.  2019 repeat u/s->fatty liver but no cirrhosis or hepatosplenomegaly.  . FUO (fever of unknown origin) 11/26/2019  . GERD (gastroesophageal reflux disease)   . Glaucoma   . GOUT   . Hallux valgus (acquired)   . HH (hiatus hernia)   . HYPERCHOLESTEROLEMIA-PURE   . Hypogonadism male   . ICD (implantable cardioverter-defibrillator) infection ([place]) 12/22/2019  . 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 Tommy Medal)  . Lumbosacral neuritis   . Lumbosacral spondylosis    Lumbar spinal stenosis with neurogenic claudication--contributes to his chronic pain syndrome  . Normal memory function 08/2014   Neuropsychological testing ([facility]): no cognitive impairment or sign of neurodegenerative disorder.  Likely has adjustment d/o with mixed anxiety/depressed features and may benefit from low dose SNRI.    [name] Normocytic anemia 03/2016   Mild-pt needs ferritin and vit B12 level checked (as of 03/22/16). Hb stable 09/2019  . NSTEMI (non-ST elevated myocardial infarction) ([place]) 03/20/2016   BMS to RCA  . Obesity hypoventilation syndrome ([place])   . Obesity, unspecified   . Orthostatic hypotension   . OSA on CPAP    8 cm H2O  . OSTEOARTHRITIS   . Paroxysmal atrial fibrillation ([place]) 2003    (? chronic?) Off anticoag for a while due to falls.  Then apixaban started 12/2014.  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 Ella Bodo): neuropathic pain->diff to treat, failed nucynta, failed spinal stimul trial, oxycontin hs + tramadol + gabap as of 12/2017 f/u Dr. Letta Pate.  . Personal history of colonic adenoma 10/30/2012   Diminutive adenoma, consider repeat 2019 per GI  . PFO (patent foramen ovale) 09/2019   small, with predominately L to R shunt  . Pneumonia   . Presence of cardiac defibrillator 11/07/2017  . Primary osteoarthritis of both knees    Bone on bone of medial compartments, + signif patellofemoral arth bilat.--supartz inj series started 09/12/17  . 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 PUD (peptic ulcer disease)   . PULMONARY HYPERTENSION, HX OF   . Secondary male hypogonadism 2017  . 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 Shortness of breath    with exertion: much improved s/p TAVR and treatment for CHF.  .  Sick sinus syndrome (HCC)    PPM placed  . Thrombocytopenia ([place]) 2018   HSM on 2007 abd u/s---suspect some mild splenic sequestration chronically.  [name] Unspecified glaucoma(365.9)   . Unspecified hereditary and idiopathic peripheral neuropathy approx age 1   bilat LE's, ? left arm, too.  Feet became progressively numb + left foot pain intermittently.  Pt may be trying a spinal stimulator (as of 05/2015)  . VENOUS INSUFFICIENCY    Being followed by Dr. Sharol Given as of 10/2016 for two R LL venous stasis ulcers/skin tears.  Healed as of 10/30/16 f/u with Dr. Sharol Given.  . VENTRAL HERNIA   . Wound dehiscence 10/22/2019    Past Surgical History:  Procedure Laterality Date  . AMPUTATION Left 04/11/2013   Procedure: AMPUTATION DIGIT Left 3rd toe;  Surgeon: [name], MD;  Location: [facility];  Service: Orthopedics;  Laterality: Left;  Left 3rd toe amputation at [facility]  . AMPUTATION Left 08/29/2019   Procedure: LEFT TRANSMETATARSAL AMPUTATION;  Surgeon: [name], MD;  Location: [facility];  Service: Orthopedics;  Laterality: Left;  . CARDIAC CATHETERIZATION  1997; 03/10/16   1997 Non-obstructive disease.  03/2016 BMS to RCA, with 25% pDiag dz, o/w normal cors per cath 03/07/16.  Cath 11/01/16: in stent restenosis, successful baloon angioplasty.  [name] CARDIAC CATHETERIZATION  12/24/2012   mild < 20% LCx, prox 30% RCA; LVEF 55-65% , moderate pulmonary HTN, moderate AS  . CARDIAC DEFIBRILLATOR PLACEMENT  11/07/2017   [facility] MRI Quad CRT defibrillator  . CARDIOVASCULAR STRESS TEST  05/11/16 ([facility])   Myocardial perfusion imaging:  No ischemia; scar in apex, global hypokinesis, EF 36%.  . Carotid dopplers  03/09/2016   [facility]: no hemodynamically significant stenosis on either side.  . CHOLECYSTECTOMY    . COLONOSCOPY    . COLONOSCOPY [name] 10/30/2012   Procedure: COLONOSCOPY;  Surgeon: [name], MD;  Location: [facility];  Service: Endoscopy;  Laterality: [name];  . CORONARY ANGIOPLASTY WITH STENT PLACEMENT  03/2016;  04/2017   2017-[facility]: BMS to RCA-pt was placed on Brilinta.  04/2017: DES to RCA.  . DORSAL SLIT [name] 10/29/2019   for severe phimosis. Procedure: DORSAL SLIT;  Surgeon: Alexis Frock, MD;  Location: [facility];  Service: Urology;  Laterality: [name];  42 MINS  . EYE SURGERY Bilateral cataract  . HEMORRHOID SURGERY    . INTRAOCULAR LENS INSERTION Bilateral   . KNEE SURGERY Right   . LEFT AND RIGHT HEART CATHETERIZATION WITH CORONARY ANGIOGRAM [name] 12/24/2012   Procedure: LEFT AND RIGHT HEART CATHETERIZATION WITH CORONARY ANGIOGRAM;  Surgeon: [name], MD;  Location: [facility] CATH LAB;  Service: Cardiovascular;  Laterality: [name];  . LEG SURGERY Bilateral    lenghtening   . PACEMAKER PLACEMENT  04/13/2016   2nd deg HB after TAVR, pt had DC MDT PPM placed.  [name] SHOULDER ARTHROSCOPY  08/30/2011   Procedure: ARTHROSCOPY SHOULDER;  Surgeon: [name], MD;  Location: [facility];  Service: Orthopedics;  Laterality: Right;  Right Shoulder Arthroscopy, Debridement, and Decompression  . SPINAL CORD STIMULATOR INSERTION [name] 09/10/2015   Procedure: LUMBAR SPINAL CORD STIMULATOR INSERTION;  Surgeon: [name], MD;  Location: [facility];  Service: Neurosurgery;  Laterality: [name];  . TEE WITHOUT CARDIOVERSION [name] 09/16/2019   Procedure: TRANSESOPHAGEAL ECHOCARDIOGRAM (TEE);  Surgeon: Skeet Latch, MD;  Location: [facility];  Service: Cardiovascular;  Laterality: [name];  . TEE WITHOUT CARDIOVERSION [name] 12/02/2019   +vegetation on AVR and pacer lead in RV.  EF 55-60%, normal wall motion.  Valves function normal.  Procedure: TRANSESOPHAGEAL ECHOCARDIOGRAM (TEE);  Surgeon: Geralynn Rile, MD;  Location: [facility];  Service: Cardiovascular;  Laterality: [name];  . 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  R big toe surg due to osteoarth  . TONSILLECTOMY    . traeculectomy Left    eye  . TRANSCATHETER AORTIC VALVE REPLACEMENT, TRANSFEMORAL  04/11/2016  . TRANSESOPHAGEAL ECHOCARDIOGRAM  03/09/2016; 09/2019    [facility]: EF 55-60%, PFO seen with bi-directional shunting, no thrombus in appendage.  09/2019 ->no valvular vegetations. Small patent foramen ovale with predominantly left to right shunting across the interatrial septum.  . TRANSTHORACIC ECHOCARDIOGRAM  01/2015; 01/2016; 05/18/16; 09/18/16, 05/2017, 08/2017   01/2015 No signif change in aortic stenosis (moderate).  01/2016 Severe LVH w/small LV cavity, EF 60-65%, grade I diast dysfxn.  05/2016 (s/p TAVR): EF 50-55%, grd I DD, biopros AV good.  08/2016--EF 50-55%, LV septal motion c/w conduction abnl, grd I DD,mild MS,bioprosth aortic valve well seated, w/trace AR. 05/2017 TTE EF 35%. 08/2017-EF 35%, mod  diff hypokin LV, grd I DD, biopros AV good.   . TRANSTHORACIC ECHOCARDIOGRAM  04/2018; 09/2019   04/2018: EF 40-45%, mod diffuse LV hypokinesis, grd I DD, bioprosth AV well seated, no AS or AR. 09/2019 EF 60-65%, grd I DD, valves fine, including bioprosth AV  . VITRECTOMY       reports that he has never smoked. He has never used smokeless tobacco. He reports that he does not drink alcohol and does not use drugs.  Allergies  Allergen Reactions  . Brimonidine Tartrate Shortness Of Breath    Alphagan-Shortness of breath  . Brinzolamide Shortness Of Breath    AZOPT- Shortness of breath  . Latanoprost Shortness Of Breath    XALATAN- Shortness of breath  . Nucynta [Tapentadol] Shortness Of Breath  . Sulfa Antibiotics Palpitations  . Timolol Maleate Shortness Of Breath and Other (See Comments)    TIMOPTIC- Aggravated asthma  . Diltiazem Swelling     leg swelling  . Rofecoxib Swelling     VIOXX- leg swelling  . Vancomycin Hives and Other (See Comments)    Possible "Red Man Syndrome"? > hives/blisters  . Codeine Other (See Comments)    Childhood reaction  . Tamsulosin Other (See Comments)    Dizziness   . Celecoxib Other (See Comments)    CELLBREX-confusion  . Colchicine Diarrhea    diarrhea  . Tape Rash    Family History  Problem Relation Age of  Onset  . Hypertension Mother   . Coronary artery disease Mother   . Heart attack Mother   . Neuropathy Mother   . Pulmonary fibrosis Father        asbestosis    Prior to Admission medications   Medication Sig Start Date End Date Taking? Authorizing Provider  albuterol (PROVENTIL HFA;VENTOLIN HFA) 108 (90 BASE) MCG/ACT inhaler Inhale 2 puffs into the lungs 4 (four) times daily as needed for wheezing or shortness of breath.    [provider]  allopurinol (ZYLOPRIM) 300 MG tablet TAKE 1 TABLET BY MOUTH ONCE DAILY WITH FOOD 12/09/19   McGowen, Adrian Blackwater, MD  amoxicillin (AMOXIL) 500 MG capsule Take 1 capsule (500 mg total) by mouth 3 (three) times daily. 12/22/19   Truman Hayward, MD  apixaban (ELIQUIS) 5 MG TABS tablet Take 5 mg by mouth 2 (two) times daily.    [provider]  ASPIRIN LOW DOSE 81 MG EC tablet Take 81 mg by mouth daily. 02/03/19   [provider]  B Complex-C (B-COMPLEX WITH VITAMIN C) tablet Take 1 tablet by mouth daily. Take 1 tablet daily, 277m    [provider]  cephALEXin (KEFLEX) 500 MG capsule Take 1 capsule (500 mg total) by mouth 3 (three) times daily. Patient not taking: Reported on 04/21/2020 02/10/20   ZSuzan Slick NP  ezetimibe (ZETIA) 10 MG tablet Take 1 tablet (10 mg total) by mouth daily. 12/16/19   McGowen, PAdrian Blackwater MD  finasteride (PROSCAR) 5 MG tablet Take 1 tablet (5 mg total) by mouth daily. 12/10/19   McGowen, PAdrian Blackwater MD  fluticasone (FLONASE) 50 MCG/ACT nasal spray Place 2 sprays into both nostrils as needed for allergies or rhinitis.    [provider]  gabapentin (NEURONTIN) 800 MG tablet TAKE 1 TABLET (800 MG TOTAL) BY MOUTH 4 (FOUR) TIMES DAILY. 01/14/20   Kirsteins, ALuanna Salk MD  isosorbide mononitrate (IMDUR) 30 MG 24 hr tablet Take 30 mg by mouth daily.  Patient not taking: Reported on 04/21/2020  [provider]  KLOR-CON M20 20 MEQ tablet Take 20 mEq by mouth daily. 03/07/20   [provider]  metoprolol succinate (TOPROL-XL) 25 MG 24 hr tablet Take 25 mg by mouth daily. 12/17/19   [provider]  Multiple Vitamin (MULTIVITAMIN ADULT PO) Take by mouth daily.    [provider]  niacin (NIASPAN) 1000 MG CR tablet TAKE 1 TABLET BY MOUTH TWICE A DAY 04/26/20   McGowen, Adrian Blackwater, MD  nitroGLYCERIN (NITROSTAT) 0.4 MG SL tablet Place 0.4 mg under the tongue every 5 (five) minutes as needed for chest pain.  Patient not taking: Reported on 04/21/2020 09/04/17   [provider]  pantoprazole (PROTONIX) 40 MG tablet TAKE 1 TABLET BY MOUTH EVERY DAY 01/09/20   McGowen, Adrian Blackwater, MD  rosuvastatin (CRESTOR) 40 MG tablet take 1 tablet by mouth once daily Patient taking differently: Take 40 mg by mouth daily.  02/05/17   Lelon Perla, MD  sacubitril-valsartan (ENTRESTO) 24-26 MG Take 1 tablet by mouth 2 (two) times daily.  07/19/17   [provider]  senna-docusate (SENOKOT-S) 8.6-50 MG tablet Take 1 tablet by mouth 2 (two) times daily. While taking strong pain meds to prevent constipation. Patient not taking: Reported on 04/21/2020 10/29/19   Alexis Frock, MD  terbinafine (LAMISIL) 1 % cream Apply topically.  Patient not taking: Reported on 04/21/2020    [provider]  torsemide (DEMADEX) 20 MG tablet Take 20 mg by mouth 2 (two) times daily as needed (swelling).     [provider]  vitamin C (ASCORBIC ACID) 250 MG tablet Take 250 mg by mouth daily.    [provider]  VITAMIN D, CHOLECALCIFEROL, PO Take 1 tablet by mouth daily.     [provider]  Grant Ruts INHUB 250-50 MCG/DOSE AEPB TAKE 1 PUFF BY MOUTH TWICE A DAY 02/17/20   McGowen, Adrian Blackwater, MD    Physical Exam: Constitutional: Moderately built and nourished. Vitals:   04/28/20 2239 04/28/20 2245 04/28/20 2300 04/28/20 2315  BP: (!) 107/45 118/60 (!) 115/47 (!) 97/50  Pulse: 99 93 95 98  Resp: (!) 23 (!) 30 17 14   Temp:      TempSrc:      SpO2: 96%  100% 99% 97%  Weight:      Height:       Eyes: Anicteric no pallor. ENMT: No discharge from the ears eyes nose or mouth. Neck: No mass felt.  No neck rigidity. Respiratory: No rhonchi or crepitations. Cardiovascular: S1-S2 heard. Abdomen: Soft nontender bowel sounds present. Musculoskeletal: No edema.  Left foot stump has small ulceration. Skin: Left foot stump risk small ulceration.  No active discharge. Neurologic: Alert awake oriented to time place and person.  Moves all extremities. Psychiatric: Appears normal.  Normal affect.   Labs on Admission: I have personally reviewed following labs and imaging studies  CBC: Recent Labs  Lab 04/28/20 2118  WBC 16.0*  NEUTROABS 13.8*  HGB 11.7*  HCT 36.6*  MCV 93.4  PLT 786*   Basic Metabolic Panel: Recent Labs  Lab 04/28/20 2118  NA 140  K 4.6  CL 107  CO2 23  GLUCOSE 161*  BUN 23  CREATININE 1.13  CALCIUM 8.6*   GFR: Estimated Creatinine Clearance: 77.1 mL/min (by C-G formula based on SCr of 1.13 mg/dL). Liver Function Tests: No results for input(s): AST, ALT, ALKPHOS, BILITOT, PROT, ALBUMIN in the last 168 hours. No results for input(s): LIPASE, AMYLASE in the last 168  hours. No results for input(s): AMMONIA in the last 168 hours. Coagulation Profile: No results for input(s): INR, PROTIME in the last 168 hours. Cardiac Enzymes: No results for input(s): CKTOTAL, CKMB, CKMBINDEX, TROPONINI in the last 168 hours. BNP (last 3 results) No results for input(s): PROBNP in the last 8760 hours. HbA1C: No results for input(s): HGBA1C in the last 72 hours. CBG: Recent Labs  Lab 04/28/20 2051  GLUCAP 161*   Lipid Profile: No results for input(s): CHOL, HDL, LDLCALC, TRIG, CHOLHDL, LDLDIRECT in the last 72 hours. Thyroid Function Tests: No results for input(s): TSH, T4TOTAL, FREET4, T3FREE, THYROIDAB in the last 72 hours. Anemia Panel: No results for input(s): VITAMINB12, FOLATE, FERRITIN, TIBC, IRON, RETICCTPCT in  the last 72 hours. Urine analysis:    Component Value Date/Time   COLORURINE YELLOW 12/04/2019 1903   APPEARANCEUR CLEAR 12/04/2019 1903   LABSPEC 1.020 12/04/2019 1903   PHURINE 6.0 12/04/2019 1903   GLUCOSEU NEGATIVE 12/04/2019 1903   GLUCOSEU NEGATIVE 05/25/2006 1353   HGBUR NEGATIVE 12/04/2019 1903   BILIRUBINUR NEGATIVE 12/04/2019 1903   BILIRUBINUR negative 12/25/2018 1408   KETONESUR NEGATIVE 12/04/2019 1903   PROTEINUR NEGATIVE 12/04/2019 1903   UROBILINOGEN 0.2 12/25/2018 1408   UROBILINOGEN 0.2 mg/dL 05/25/2006 1353   NITRITE NEGATIVE 12/04/2019 1903   LEUKOCYTESUR NEGATIVE 12/04/2019 1903   Sepsis Labs: @LABRCNTIP (procalcitonin:4,lacticidven:4) )No results found for this or any previous visit (from the past 240 hour(s)).   Radiological Exams on Admission: DG Chest Port 1 View  Result Date: 04/28/2020 CLINICAL DATA:  Chest pain, weakness and dizziness. EXAM: PORTABLE CHEST 1 VIEW COMPARISON:  01/20/2020 FINDINGS: Mild patient rotation. Multi lead left-sided pacemaker. Cardiomegaly slightly increased from prior exam. Unchanged mediastinal contours. Aortic atherosclerosis. Mild vascular engorgement. No pulmonary edema. No focal airspace disease. No large pleural effusion. No pneumothorax. The bones appear under mineralized. Spinal stimulator in place. Multiple overlying monitoring devices. No acute osseous abnormalities are seen. IMPRESSION: Cardiomegaly with mild vascular congestion. Electronically Signed   By: Keith Rake M.D.   On: 04/28/2020 21:40    EKG: Independently reviewed.  Sinus tachycardia.  Assessment/Plan Principal Problem:   Fever Active Problems:   COPD (chronic obstructive pulmonary disease) (HCC)   CAD (coronary artery disease), native coronary artery   S/P TAVR (transcatheter aortic valve replacement)   ICD (implantable cardioverter-defibrillator) infection ([place])    1. Sepsis source not clear.  Patient is on empiric antibiotics follow  cultures at this time I am ordering fluid bolus since patient blood pressure is in the low normal with elevated lactic acid.  Closely monitor patient's respiratory status given history of ischemic cardiomyopathy.  Since patient has an ulceration on the left foot stump and patient stated at times it discharges have ordered a CT of the left foot stump to look for any deep infections. 2. History of paroxysmal atrial fibrillation presently on apixaban which will be continued.  Holding metoprolol due to low normal blood pressure. 3. Ischemic cardiomyopathy with history of CAD status post stenting.  Holding Entresto and beta-blockers due to low normal blood pressure and septic picture patient receiving fluid at this time.  Holding torsemide.  Follow blood pressure trends and also respiratory status. 4. Admission for early part of this year for prosthetic valve endocarditis involving the TAVR and also pacemaker leads prior to which patient also was admitted for subacute osteomyelitis involving the left foot status post transmetatarsal amputation. 5. Anemia appears to be chronic follow CBC. 6. History of prediabetes we will check hemoglobin  A1c. 7. COPD not actively wheezing. 8. Thrombocytopenia might be from infectious process follow CBC. 9. Hyperlipidemia continue statins and Zetia.  Given the septic-like picture patient will need close monitoring for any further worsening in inpatient status.   DVT prophylaxis: Apixaban. Code Status: DNR. Family Communication: Patient's wife. Disposition Plan: Home. Consults called: None. Admission status: Inpatient.   Rise Patience MD [facility] Pager [phone].  If 7PM-7AM, please contact night-coverage [email or web address] Password [email or web address]  04/29/2020, 12:09 AM

## 2020-04-29 NOTE — ED Notes (Signed)
Lunch Tray Ordered @ [phone].

## 2020-04-29 NOTE — ED Notes (Signed)
Received report from [employer], pt's biventricular ICD working as programmed. Has had 5 ventricle sensing episodes since 12/17/19 when it was last checked. Last epidsode was 02/21/20. ICD programmed for ventricle rate 188bpm. Monitoring set for ventricle rate 150bpm. Cardiac compass sensing possible intrathoracic fluid accumulation.

## 2020-04-29 NOTE — Progress Notes (Signed)
PROGRESS NOTE                                                                                                                                                                                                             Patient Demographics:    Tim [name], is a 79 y.o. male, DOB - 02-May-1941, MCN:[id number]  Outpatient Primary MD for the patient is McGowen, Adrian Blackwater, MD    LOS - 0  Admit date - 04/28/2020    Chief Complaint  Patient presents with  . Weakness       Brief Narrative (HPI from H&P)   Tim [name] is a 79 y.o. 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   Subjective:    Tim [name] today has, No headache, No chest pain, No abdominal pain - No Nausea, No new weakness tingling or numbness,   Cough - SOB.     Assessment  & Plan :    1. 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 On chronic amoxicillin treatment under the care of Dr. Drucilla Schmidt ID.   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 Sharol Given several months ago, has a chronic shallow ulcer on the plantar aspect of his left  foot which appears not infected. CT left foot unremarkable. Chest x-ray unremarkable. No dysuria or other source per history. Presentation highly suspicious for recurrence of endocarditis or pacemaker lead infection. Echocardiogram ordered, procalcitonin will be trended, blood cultures pending, currently on empiric IV antibiotics. ID has been requested to evaluate the patient as well. Currently appears nontoxic, continue gentle IV fluid hydration as  he is mildly hypotensive. Sepsis pathophysiology is improving.    2. Paroxysmal A. fib. [place] vas score 2 score of greater than 3. Continue Eliquis, beta-blocker from tomorrow once blood pressure improves.  3. Ischemic cardiomyopathy with chronic systolic heart failure EF around 40%. He has chronic 2+ leg edema but for now beta-blocker, Entresto at home diuretics on hold due to hypotension, will stop IV fluids and start diuresing once blood pressure allows.  4. Anemia of chronic disease. Stable follow.  5. COPD. Stable no wheezing.  6. BPH. Resume alpha-blocker.  7. Thrombocytopenia. Likely due to infection monitor.  Lab Results  Component Value Date   PLT 99 (L) 04/29/2020    8. Prediabetes. Monitor. Add ISS as he stress-induced hyperglycemia.  Lab Results  Component Value Date   HGBA1C 5.6 10/02/2019   CBG (last 3)  Recent Labs    04/28/20 2051  GLUCAP 161*   Lab Results  Component Value Date   TSH 0.96 09/16/2018      Condition - Extremely Guarded  Family Communication  :  Wife bedside 04/29/20  Code Status : DNR  Consults  :  ID  Procedures  :    CT L Foot - no abscess  PUD Prophylaxis : PPI  Disposition Plan  :    Status is: Inpatient  Remains inpatient appropriate because:IV treatments appropriate due to intensity of illness or inability to take PO   Dispo: The patient is from: Home              Anticipated d/c is to: Home              Anticipated d/c date is: > 3 days              Patient currently is  not medically stable to d/c.  DVT Prophylaxis  : Eliquis  Lab Results  Component Value Date   PLT 99 (L) 04/29/2020    Diet :  Diet Order            Diet Heart Room service appropriate? Yes; Fluid consistency: Thin  Diet effective now                  Inpatient Medications  Scheduled Meds: . allopurinol  100 mg Oral BID  . apixaban  5 mg Oral BID  . ezetimibe  10 mg Oral Daily  . finasteride  5 mg Oral Daily  . gabapentin  800 mg Oral QID  . [START ON 04/30/2020] metoprolol succinate  25 mg Oral Daily  . pantoprazole  40 mg Oral Q1200  . rosuvastatin  40 mg Oral QHS   Continuous Infusions: . ampicillin (OMNIPEN) IV Stopped (04/29/20 0715)  . cefTRIAXone (ROCEPHIN)  IV    . lactated ringers    . magnesium sulfate bolus IVPB     PRN Meds:.albuterol  Antibiotics  :    Anti-infectives (From admission, onward)   Start     Dose/Rate Route Frequency Ordered Stop   04/29/20 2200  cefTRIAXone (ROCEPHIN) 2 g in sodium chloride 0.9 % 100 mL IVPB        2 g 200 mL/hr over 30 Minutes Intravenous Every 24 hours 04/29/20 0013     04/29/20 0600  ampicillin (OMNIPEN) 2 g in sodium chloride 0.9 % 100 mL IVPB        2 g 300 mL/hr over 20 Minutes Intravenous Every 6 hours 04/29/20 0013     04/28/20 2215  cefTRIAXone (ROCEPHIN) 1 g in sodium  chloride 0.9 % 100 mL IVPB  Status:  Discontinued        1 g 200 mL/hr over 30 Minutes Intravenous  Once 04/28/20 2201 04/28/20 2211   04/28/20 2215  ampicillin (OMNIPEN) 2 g in sodium chloride 0.9 % 100 mL IVPB        2 g 300 mL/hr over 20 Minutes Intravenous  Once 04/28/20 2201 04/29/20 0027   04/28/20 2215  cefTRIAXone (ROCEPHIN) 2 g in sodium chloride 0.9 % 100 mL IVPB        2 g 200 mL/hr over 30 Minutes Intravenous  Once 04/28/20 2211 04/28/20 2314       Time Spent in minutes  30   Lala Lund M.D on 04/29/2020 at 9:56 AM  To page go to [email or web address] - password [facility]  [facility] -  Office  [phone]   See all  Orders from today for further details    Objective:   Vitals:   04/29/20 0745 04/29/20 0800 04/29/20 0815 04/29/20 0830  BP: (!) 108/42 (!) 93/43 (!) 96/57 106/67  Pulse: 79 74 73 73  Resp: (!) 22 (!) 28 (!) 5 20  Temp:      TempSrc:      SpO2: 98% 98% 97% 97%  Weight:      Height:        Wt Readings from Last 3 Encounters:  04/28/20 (!) 145.2 kg  04/21/20 (!) 141.5 kg  04/19/20 (!) 145.2 kg     Intake/Output Summary (Last 24 hours) at 04/29/2020 0956 Last data filed at 04/29/2020 0750 Gross per 24 hour  Intake 3552.57 ml  Output --  Net 3552.57 ml     Physical Exam  Awake Alert, No new F.N deficits, Normal affect [place].AT,PERRAL Supple Neck,No JVD, No cervical lymphadenopathy appriciated.  Symmetrical Chest wall movement, Good air movement bilaterally, CTAB RRR,No Gallops, positive aortic systolic murmur +ve B.Sounds, Abd Soft, No tenderness, No organomegaly appriciated, No rebound - guarding or rigidity. No Cyanosis, left foot transmetatarsal amputation with a small plantar surface chronic shallow ulcer without any signs of active infection overlying first metatarsal head. He has chronic 2+ edema in both lower extremities left more than right.    Data Review:    CBC Recent Labs  Lab 04/28/20 2118 04/29/20 0759  WBC 16.0* 15.0*  HGB 11.7* 10.4*  HCT 36.6* 33.6*  PLT 106* 99*  MCV 93.4 93.6  MCH 29.8 29.0  MCHC 32.0 31.0  RDW 14.7 14.7  LYMPHSABS 0.6*  --   MONOABS 1.4*  --   EOSABS 0.1  --   BASOSABS 0.0  --     Recent Labs  Lab 04/28/20 2118 04/28/20 2133 04/28/20 2338 04/29/20 0759 04/29/20 0801  NA 140  --   --  141  --   K 4.6  --   --  4.0  --   CL 107  --   --  110  --   CO2 23  --   --  23  --   GLUCOSE 161*  --   --  125*  --   BUN 23  --   --  22  --   CREATININE 1.13  --   --  1.09  --   CALCIUM 8.6*  --   --  8.3*  --   AST  --   --   --  26  --   ALT  --   --   --  21  --  ALKPHOS  --   --   --  48  --   BILITOT  --   --    --  0.8  --   ALBUMIN  --   --   --  2.7*  --   MG  --   --   --  1.4*  --   DDIMER  --   --   --  1.33*  --   LATICACIDVEN  --  1.8 2.0*  --  1.3  BNP  --   --   --  82.7  --     ------------------------------------------------------------------------------------------------------------------ No results for input(s): CHOL, HDL, LDLCALC, TRIG, CHOLHDL, LDLDIRECT in the last 72 hours.  Lab Results  Component Value Date   HGBA1C 5.6 10/02/2019   ------------------------------------------------------------------------------------------------------------------ No results for input(s): TSH, T4TOTAL, T3FREE, THYROIDAB in the last 72 hours.  Invalid input(s): FREET3  Cardiac Enzymes No results for input(s): CKMB, TROPONINI, MYOGLOBIN in the last 168 hours.  Invalid input(s): CK ------------------------------------------------------------------------------------------------------------------    Component Value Date/Time   BNP 82.7 04/29/2020 0759   BNP 32.0 01/14/2014 1656    Micro Results Recent Results (from the past 240 hour(s))  Respiratory Panel by RT PCR (Flu A&B, Covid) - Nasopharyngeal Swab     Status: None   Collection Time: 04/28/20 10:43 PM   Specimen: Nasopharyngeal Swab  Result Value Ref Range Status   SARS Coronavirus 2 by RT PCR NEGATIVE NEGATIVE Final    Comment: (NOTE) SARS-CoV-2 target nucleic acids are NOT DETECTED.  The SARS-CoV-2 RNA is generally detectable in upper respiratoy specimens during the acute phase of infection. The lowest concentration of SARS-CoV-2 viral copies this assay can detect is 131 copies/mL. A negative result does not preclude SARS-Cov-2 infection and should not be used as the sole basis for treatment or other patient management decisions. A negative result may occur with  improper specimen collection/handling, submission of specimen other than nasopharyngeal swab, presence of viral mutation(s) within the areas targeted by this  assay, and inadequate number of viral copies (<131 copies/mL). A negative result must be combined with clinical observations, patient history, and epidemiological information. The expected result is Negative.  Fact Sheet for Patients:  PinkCheek.be  Fact Sheet for Healthcare Providers:  GravelBags.it  This test is no t yet approved or cleared by the [place] FDA and  has been authorized for detection and/or diagnosis of SARS-CoV-2 by FDA under an Emergency Use Authorization (EUA). This EUA will remain  in effect (meaning this test can be used) for the duration of the COVID-19 declaration under Section 564(b)(1) of the Act, 21 U.S.C. section 360bbb-3(b)(1), unless the authorization is terminated or revoked sooner.     Influenza A by PCR NEGATIVE NEGATIVE Final   Influenza B by PCR NEGATIVE NEGATIVE Final    Comment: (NOTE) The Xpert Xpress SARS-CoV-2/FLU/RSV assay is intended as an aid in  the diagnosis of influenza from Nasopharyngeal swab specimens and  should not be used as a sole basis for treatment. Nasal washings and  aspirates are unacceptable for Xpert Xpress SARS-CoV-2/FLU/RSV  testing.  Fact Sheet for Patients: PinkCheek.be  Fact Sheet for Healthcare Providers: GravelBags.it  This test is not yet approved or cleared by the [place] FDA and  has been authorized for detection and/or diagnosis of SARS-CoV-2 by  FDA under an Emergency Use Authorization (EUA). This EUA will remain  in effect (meaning this test can be used) for the duration of the  Covid-19 declaration under Section 564(b)(1)  of the Act, 21  U.S.C. section 360bbb-3(b)(1), unless the authorization is  terminated or revoked. Performed at [facility], [place] [address] [place], [place] [address]     Radiology Reports CT FOOT LEFT WO CONTRAST  Result Date:  04/29/2020 CLINICAL DATA:  Question of osteomyelitis EXAM: CT OF THE LEFT FOOT WITHOUT CONTRAST TECHNIQUE: Multidetector CT imaging of the left foot was performed according to the standard protocol. Multiplanar CT image reconstructions were also generated. COMPARISON:  None. FINDINGS: Bones/Joint/Cartilage The patient is status post transmetatarsal first through fifth digit amputation. There is no definite areas of cortical destruction or periosteal reaction noted. The amputation sites appear to be grossly intact. 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 Calcaneal enthesophyte seen on the plantar surface. There is diffuse osteopenia. Ligaments Suboptimally assessed by CT. Muscles and Tendons There is diffuse fatty atrophy of the muscles surrounding the forefoot. The flexor extensor tendons appear to be grossly intact. The plantar fascia appears to be intact. Soft tissues Overlying the plantar the near the first metatarsal amputation site there is a focal area of superficial ulceration with diffuse skin thickening and subcutaneous edema. No definite loculated fluid collection or subcutaneous emphysema is seen. IMPRESSION: Superficial ulceration along the plantar base beneath the first metatarsal amputation site with cellulitis and postsurgical changes. No definite loculated fluid collection or subcutaneous emphysema. Status post transmetatarsal amputation of the first through fifth digits without definite evidence of osteomyelitis. Electronically Signed   By: Prudencio Pair M.D.   On: 04/29/2020 03:30   DG Chest Port 1 View  Result Date: 04/28/2020 CLINICAL DATA:  Chest pain, weakness and dizziness. EXAM: PORTABLE CHEST 1 VIEW COMPARISON:  01/20/2020 FINDINGS: Mild patient rotation. Multi lead left-sided pacemaker. Cardiomegaly slightly increased from prior exam. Unchanged mediastinal contours. Aortic atherosclerosis. Mild vascular engorgement. No pulmonary edema. No focal airspace disease.  No large pleural effusion. No pneumothorax. The bones appear under mineralized. Spinal stimulator in place. Multiple overlying monitoring devices. No acute osseous abnormalities are seen. IMPRESSION: Cardiomegaly with mild vascular congestion. Electronically Signed   By: Keith Rake M.D.   On: 04/28/2020 21:40

## 2020-04-29 NOTE — Consult Note (Signed)
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 Certainly there is concern for recurrence of enterococcal bacteremia. Left foot also being evaluated as potential source and awaiting further evaluation by Dr. Sharol Given. CT scan with no evidence of osteomyelitis. Blood cultures are pending. Will continue with ampicillin and ceftriaxone given history of enterococcus.  PLAN:  1. Continue current dose of ampicillin and ceftriaxone.  2. Await further evaluation/recommendation from Dr. Sharol Given 3. Monitor fever curve and cultures.    Principal Problem:   Sepsis ([place]) Active Problems:   COPD (chronic obstructive pulmonary disease) (HCC)   CAD (coronary artery disease), native coronary artery   S/P TAVR (transcatheter aortic valve replacement)   ICD (implantable cardioverter-defibrillator) infection (HCC)   Fever   . allopurinol  100 mg Oral BID  . apixaban  5 mg Oral BID  . ezetimibe  10 mg Oral Daily  . finasteride  5 mg Oral Daily  . gabapentin  800 mg Oral QID  . insulin aspart  0-9 Units Subcutaneous TID WC  . [START ON 04/30/2020] metoprolol succinate  25 mg Oral Daily  . pantoprazole  40 mg Oral Q1200  . rosuvastatin  40 mg Oral QHS     HPI: Tim [name] is a 79 y.o. male with medical history listed below is well known to the ID service and presented to the ER after starting to have fever, chills, and weakness.   Tim [name] initially presented with AMP sensitive enterococcal UTI complicated by enterococcal bacteremia in March 2021 in the setting of AICD and TAVR. TTE and TEE at the time were without evidence of vegetation and was prescribed 2  weeks of ampicillin. On follow up on 4/21 there was worsening of his amputation site with erythema and drainage. He was placed on doxycycline by Dr. Sharol Given which was then changed to Zyvox by Dr. Tommy Medal. This resolved his symptoms. On follow up with Dr. Tommy Medal on 5/26 he was experiencing fevers and dizziness with concern for systemic infection. Blood cultures in the ID clinic were positive for Enterococcus faecalis bacteremia. Surgical site was well healed at that time. 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 Recommended treatment was 6 weeks of ampicillin and ceftriaxone through 6/21. Feeling better on 6/21 and transitioned to life-long suppression with amoxicillin three times daily.   Tim [name] was then seen on 7/15 seen by Dr. Johnnye Sima with concern for cellulitis and was continued on his Amoxicillin. He followed up with Dr. Johnnye Sima on 8/6 and continued to do well with Amoxicillin. Last seen by Dr. Tommy Medal on 9/13 and was continued on Amoxicillin and amputation site was followed by Dr. Sharol Given.   Tim [name] returned to the ED on 10/27 with concern for sepsis and no clear source of infection. Chest x-ray with cardiomegaly with mild vascular congestion.  CT left foot with superficial ulceration along the plantar base beneath the first metatarsal amputation site with cellulitis and post-surgical changes without fluid collection or subcutaneous emphysema or osteomyelitis.   Review of Systems: Review of Systems  Constitutional: Positive for  fever. Negative for chills and weight loss.  Respiratory: Negative for cough, shortness of breath and wheezing.   Cardiovascular: Negative for chest pain and leg swelling.  Gastrointestinal: Negative for abdominal pain, constipation, diarrhea, nausea and vomiting.  Skin: Negative for rash.     Past Medical History:  Diagnosis Date  . AICD (automatic cardioverter/defibrillator) present   . ASTHMA   . Asthma    as a child  . Balanitis     +severe phimosis+ buried penis->circ not possible but dorsal slit done 10/2019  . BPH (benign prostatic hypertrophy)    with urinary retention.  Renal u/s 12/04/19 NORMAL KIDNEYS, NORMAL BLADDER, NO HYDRONEPHROSIS  . CAD (coronary artery disease)    Nonobstructive by 12/2012 cath; then 03/2016 he required BMS to RCA ([facility]).  In-stent restenosis on cath 11/01/16, baloon angioplasty successful--pt to take plavix (no ASA), + eliquis (for PAF).  [name] CATARACT, HX OF   . Chronic combined systolic and diastolic CHF (congestive heart failure) ([place]) 05/2016   Ischemic CM.  04/2018 EF 40-45%, grd I DD.  [name] Chronic pain syndrome    Lumbar DDD; chronic neuropathic pain (DM); has spinal stimulator and sees pain mgmt MD  . Complicated UTI (urinary tract infection) 09/2019   Phimoses, acute urinary retention, entoerococcus UTI, enterococc bacteremia.  F/u blood clx's neg x 5d.    [name] COPD   . Diabetes mellitus type 2 with complications (HCC)    YKD9I jumped from 5.7% to 6.1% 03/2015---started metformin at that time.  DM 2 dx by fasting gluc criteria 2018.  Has chronic neuropath pain  . Elevated transaminase level 02/2016   Suspect due to fatty liver (documented on u/s 2007). Plan repeat labs 03/2016.  [name] Enterococcal bacteremia 09/2019   Phimoses, acute urinary retention, entoerococcus UTI, enterococc bacteremia.  F/u blood clx's neg x 5d.    . Essential hypertension, benign   . Fatty liver 2007   2007 u/s showed fatty liver with hepatosplenomegaly.  2019 repeat u/s->fatty liver but no cirrhosis or hepatosplenomegaly.  . FUO (fever of unknown origin) 11/26/2019  . GERD (gastroesophageal reflux disease)   . Glaucoma   . GOUT   . Hallux valgus (acquired)   . HH (hiatus hernia)   . HYPERCHOLESTEROLEMIA-PURE   . Hypogonadism male   . ICD (implantable cardioverter-defibrillator) infection ([place]) 12/22/2019  . 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 Tommy Medal)  . Lumbosacral neuritis   . Lumbosacral spondylosis    Lumbar spinal stenosis with neurogenic claudication--contributes to his chronic pain syndrome  . Normal memory function 08/2014   Neuropsychological testing ([facility]): no cognitive impairment or sign of neurodegenerative disorder.  Likely has adjustment d/o with mixed anxiety/depressed features and may benefit from low dose SNRI.    [name] Normocytic anemia 03/2016   Mild-pt needs ferritin and vit B12 level checked (as of 03/22/16). Hb stable 09/2019  . NSTEMI (non-ST elevated myocardial infarction) ([place]) 03/20/2016   BMS to RCA  . Obesity hypoventilation syndrome ([place])   . Obesity, unspecified   . Orthostatic hypotension   . OSA on CPAP    8 cm H2O  . OSTEOARTHRITIS   . Paroxysmal atrial fibrillation ([place]) 2003    (? chronic?) Off anticoag for a while due to falls.  Then apixaban started 12/2014.  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 Ella Bodo): neuropathic pain->diff to treat, failed nucynta, failed spinal  stimul trial, oxycontin hs + tramadol + gabap as of 12/2017 f/u Dr. Letta Pate.  . Personal history of colonic adenoma 10/30/2012   Diminutive adenoma, consider repeat 2019 per GI  . PFO (patent foramen ovale) 09/2019   small, with predominately L to R shunt  . Pneumonia   . Presence of cardiac defibrillator 11/07/2017  . Primary osteoarthritis of both knees    Bone on bone of medial compartments, + signif patellofemoral arth bilat.--supartz inj series started 09/12/17  . 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 PUD (peptic ulcer disease)   . PULMONARY HYPERTENSION, HX OF   . Secondary male hypogonadism 2017  . 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 Shortness of breath    with exertion: much improved s/p TAVR and treatment for CHF.  . Sick sinus syndrome (HCC)    PPM  placed  . Thrombocytopenia ([place]) 2018   HSM on 2007 abd u/s---suspect some mild splenic sequestration chronically.  [name] Unspecified glaucoma(365.9)   . Unspecified hereditary and idiopathic peripheral neuropathy approx age 70   bilat LE's, ? left arm, too.  Feet became progressively numb + left foot pain intermittently.  Pt may be trying a spinal stimulator (as of 05/2015)  . VENOUS INSUFFICIENCY    Being followed by Dr. Sharol Given as of 10/2016 for two R LL venous stasis ulcers/skin tears.  Healed as of 10/30/16 f/u with Dr. Sharol Given.  . VENTRAL HERNIA   . Wound dehiscence 10/22/2019    Social History   Tobacco Use  . Smoking status: Never Smoker  . Smokeless tobacco: Never Used  Substance Use Topics  . Alcohol use: No    Alcohol/week: 0.0 standard drinks  . Drug use: No    Types: Oxycodone    Family History  Problem Relation Age of Onset  . Hypertension Mother   . Coronary artery disease Mother   . Heart attack Mother   . Neuropathy Mother   . Pulmonary fibrosis Father        asbestosis    Allergies  Allergen Reactions  . Brimonidine Tartrate Shortness Of Breath    Alphagan-Shortness of breath  . Brinzolamide Shortness Of Breath    AZOPT- Shortness of breath  . Latanoprost Shortness Of Breath    XALATAN- Shortness of breath  . Nucynta [Tapentadol] Shortness Of Breath  . Sulfa Antibiotics Palpitations  . Timolol Maleate Shortness Of Breath and Other (See Comments)    TIMOPTIC- Aggravated asthma  . Diltiazem Swelling     leg swelling  . Rofecoxib Swelling     VIOXX- leg swelling  . Vancomycin Hives and Other (See Comments)    Possible "Red Man Syndrome"? > hives/blisters  . Codeine Other (See Comments)    Childhood reaction  . Tamsulosin Other (See Comments)    Dizziness   . Celecoxib Other (See Comments)    CELLBREX-confusion  . Colchicine Diarrhea    diarrhea  . Tape Rash    OBJECTIVE: Blood pressure (!) 115/59, pulse 71, temperature 98.7 F (37.1 C), temperature  source Oral, resp. rate 19, height 6' (1.829 m), weight (!) 145.2 kg, SpO2 98 %.  Physical Exam Constitutional:      General: He is not in acute distress.    Appearance: He is well-developed.     Comments: Lying in bed with head of bed elevated; pleasant.   Cardiovascular:     Rate and Rhythm: Normal rate and  regular rhythm.     Heart sounds: Normal heart sounds.  Pulmonary:     Effort: Pulmonary effort is normal.     Breath sounds: Normal breath sounds.  Musculoskeletal:     Comments: Left foot s/p transmetatarsal amputation with dressing on the stump with mild amount of drainage and hard callus around the site.   Skin:    General: Skin is warm and dry.  Neurological:     Mental Status: He is alert and oriented to person, place, and time.  Psychiatric:        Behavior: Behavior normal.        Thought Content: Thought content normal.        Judgment: Judgment normal.     Lab Results Lab Results  Component Value Date   WBC 15.0 (H) 04/29/2020   HGB 10.4 (L) 04/29/2020   HCT 33.6 (L) 04/29/2020   MCV 93.6 04/29/2020   PLT 99 (L) 04/29/2020    Lab Results  Component Value Date   CREATININE 1.09 04/29/2020   BUN 22 04/29/2020   NA 141 04/29/2020   K 4.0 04/29/2020   CL 110 04/29/2020   CO2 23 04/29/2020    Lab Results  Component Value Date   ALT 21 04/29/2020   AST 26 04/29/2020   ALKPHOS 48 04/29/2020   BILITOT 0.8 04/29/2020     Microbiology: Recent Results (from the past 240 hour(s))  Respiratory Panel by RT PCR (Flu A&B, Covid) - Nasopharyngeal Swab     Status: None   Collection Time: 04/28/20 10:43 PM   Specimen: Nasopharyngeal Swab  Result Value Ref Range Status   SARS Coronavirus 2 by RT PCR NEGATIVE NEGATIVE Final    Comment: (NOTE) SARS-CoV-2 target nucleic acids are NOT DETECTED.  The SARS-CoV-2 RNA is generally detectable in upper respiratoy specimens during the acute phase of infection. The lowest concentration of SARS-CoV-2 viral copies this  assay can detect is 131 copies/mL. A negative result does not preclude SARS-Cov-2 infection and should not be used as the sole basis for treatment or other patient management decisions. A negative result may occur with  improper specimen collection/handling, submission of specimen other than nasopharyngeal swab, presence of viral mutation(s) within the areas targeted by this assay, and inadequate number of viral copies (<131 copies/mL). A negative result must be combined with clinical observations, patient history, and epidemiological information. The expected result is Negative.  Fact Sheet for Patients:  PinkCheek.be  Fact Sheet for Healthcare Providers:  GravelBags.it  This test is no t yet approved or cleared by the [place] FDA and  has been authorized for detection and/or diagnosis of SARS-CoV-2 by FDA under an Emergency Use Authorization (EUA). This EUA will remain  in effect (meaning this test can be used) for the duration of the COVID-19 declaration under Section 564(b)(1) of the Act, 21 U.S.C. section 360bbb-3(b)(1), unless the authorization is terminated or revoked sooner.     Influenza A by PCR NEGATIVE NEGATIVE Final   Influenza B by PCR NEGATIVE NEGATIVE Final    Comment: (NOTE) The Xpert Xpress SARS-CoV-2/FLU/RSV assay is intended as an aid in  the diagnosis of influenza from Nasopharyngeal swab specimens and  should not be used as a sole basis for treatment. Nasal washings and  aspirates are unacceptable for Xpert Xpress SARS-CoV-2/FLU/RSV  testing.  Fact Sheet for Patients: PinkCheek.be  Fact Sheet for Healthcare Providers: GravelBags.it  This test is not yet approved or cleared by the [place] and  has been authorized for detection and/or diagnosis of SARS-CoV-2 by  FDA under an Emergency Use Authorization (EUA). This EUA will  remain  in effect (meaning this test can be used) for the duration of the  Covid-19 declaration under Section 564(b)(1) of the Act, 21  U.S.C. section 360bbb-3(b)(1), unless the authorization is  terminated or revoked. Performed at [facility], [place] [address], [place], [place] [address]      Terri Piedra, [place]  04/29/2020  1:04 PM

## 2020-04-29 NOTE — Progress Notes (Signed)
  Echocardiogram 2D Echocardiogram has been performed.  Tim [name] 04/29/2020, 4:59 PM

## 2020-04-29 NOTE — Progress Notes (Signed)
Patient has his home CPAP and needed no assistance placing it on himself.

## 2020-04-30 ENCOUNTER — Inpatient Hospital Stay: Payer: Self-pay

## 2020-04-30 DIAGNOSIS — I330 Acute and subacute infective endocarditis: Secondary | ICD-10-CM

## 2020-04-30 DIAGNOSIS — I059 Rheumatic mitral valve disease, unspecified: Secondary | ICD-10-CM

## 2020-04-30 DIAGNOSIS — I39 Endocarditis and heart valve disorders in diseases classified elsewhere: Secondary | ICD-10-CM | POA: Diagnosis not present

## 2020-04-30 LAB — CBC WITH DIFFERENTIAL/PLATELET
Abs Immature Granulocytes: 0.02 10*3/uL (ref 0.00–0.07)
Basophils Absolute: 0 10*3/uL (ref 0.0–0.1)
Basophils Relative: 0 %
Eosinophils Absolute: 0.1 10*3/uL (ref 0.0–0.5)
Eosinophils Relative: 2 %
HCT: 32.7 % — ABNORMAL LOW (ref 39.0–52.0)
Hemoglobin: 10.5 g/dL — ABNORMAL LOW (ref 13.0–17.0)
Immature Granulocytes: 0 %
Lymphocytes Relative: 19 %
Lymphs Abs: 1.3 10*3/uL (ref 0.7–4.0)
MCH: 29.2 pg (ref 26.0–34.0)
MCHC: 32.1 g/dL (ref 30.0–36.0)
MCV: 90.8 fL (ref 80.0–100.0)
Monocytes Absolute: 0.8 10*3/uL (ref 0.1–1.0)
Monocytes Relative: 11 %
Neutro Abs: 4.7 10*3/uL (ref 1.7–7.7)
Neutrophils Relative %: 68 %
Platelets: 91 10*3/uL — ABNORMAL LOW (ref 150–400)
RBC: 3.6 MIL/uL — ABNORMAL LOW (ref 4.22–5.81)
RDW: 14.7 % (ref 11.5–15.5)
WBC: 7 10*3/uL (ref 4.0–10.5)
nRBC: 0 % (ref 0.0–0.2)

## 2020-04-30 LAB — GLUCOSE, CAPILLARY
Glucose-Capillary: 112 mg/dL — ABNORMAL HIGH (ref 70–99)
Glucose-Capillary: 118 mg/dL — ABNORMAL HIGH (ref 70–99)
Glucose-Capillary: 135 mg/dL — ABNORMAL HIGH (ref 70–99)
Glucose-Capillary: 107 mg/dL — ABNORMAL HIGH (ref 70–99)

## 2020-04-30 LAB — COMPREHENSIVE METABOLIC PANEL
ALT: 23 U/L (ref 0–44)
AST: 35 U/L (ref 15–41)
Albumin: 2.8 g/dL — ABNORMAL LOW (ref 3.5–5.0)
Alkaline Phosphatase: 48 U/L (ref 38–126)
Anion gap: 6 (ref 5–15)
BUN: 18 mg/dL (ref 8–23)
CO2: 25 mmol/L (ref 22–32)
Calcium: 8.6 mg/dL — ABNORMAL LOW (ref 8.9–10.3)
Chloride: 109 mmol/L (ref 98–111)
Creatinine, Ser: 0.9 mg/dL (ref 0.61–1.24)
GFR, Estimated: >60 mL/min (ref >60)
Glucose, Bld: 127 mg/dL — ABNORMAL HIGH (ref 70–99)
Potassium: 4 mmol/L (ref 3.5–5.1)
Sodium: 140 mmol/L (ref 135–145)
Total Bilirubin: 0.7 mg/dL (ref 0.3–1.2)
Total Protein: 5.5 g/dL — ABNORMAL LOW (ref 6.5–8.1)

## 2020-04-30 LAB — MAGNESIUM: Magnesium: 1.6 mg/dL — ABNORMAL LOW (ref 1.7–2.4)

## 2020-04-30 LAB — C-REACTIVE PROTEIN: CRP: 4 mg/dL — ABNORMAL HIGH (ref <1.0)

## 2020-04-30 LAB — D-DIMER, QUANTITATIVE: D-Dimer, Quant: 0.93 ug/mL-FEU — ABNORMAL HIGH (ref 0.00–0.50)

## 2020-04-30 LAB — BRAIN NATRIURETIC PEPTIDE: B Natriuretic Peptide: 82.6 pg/mL (ref 0.0–100.0)

## 2020-04-30 LAB — PROCALCITONIN: Procalcitonin: 0.55 ng/mL

## 2020-04-30 MED ORDER — MOMETASONE FURO-FORMOTEROL FUM 100-5 MCG/ACT IN AERO
2.0000 | INHALATION_SPRAY | Freq: Two times a day (BID) | RESPIRATORY_TRACT | Status: DC
  Administered 2020-04-30 – 2020-05-07 (×13): 2 via RESPIRATORY_TRACT
  Filled 2020-04-30: qty 8.8

## 2020-04-30 MED ORDER — TORSEMIDE 20 MG PO TABS
20.0000 mg | ORAL_TABLET | Freq: Two times a day (BID) | ORAL | Status: DC
  Administered 2020-04-30 – 2020-05-01 (×3): 20 mg via ORAL
  Filled 2020-04-30 (×3): qty 1

## 2020-04-30 MED ORDER — SPIRONOLACTONE 25 MG PO TABS
25.0000 mg | ORAL_TABLET | Freq: Every day | ORAL | Status: DC
  Administered 2020-04-30 – 2020-05-01 (×2): 25 mg via ORAL
  Filled 2020-04-30 (×2): qty 1

## 2020-04-30 MED ORDER — ALBUTEROL SULFATE (2.5 MG/3ML) 0.083% IN NEBU: 2.5000 mg | INHALATION_SOLUTION | Freq: Four times a day (QID) | RESPIRATORY_TRACT | Status: DC | PRN

## 2020-04-30 MED ORDER — MAGNESIUM SULFATE 2 GM/50ML IV SOLN
2.0000 g | Freq: Once | INTRAVENOUS | Status: AC
  Administered 2020-04-30: 2 g via INTRAVENOUS
  Filled 2020-04-30: qty 50

## 2020-04-30 NOTE — Progress Notes (Signed)
Patient placed himself on home CPAP for the night.  

## 2020-04-30 NOTE — Progress Notes (Signed)
PROGRESS NOTE                                                                                                                                                                                                             Patient Demographics:    Tim [name], is a 79 y.o. male, DOB - 24-Mar-1941, SWH:[id number]  Outpatient Primary MD for the patient is McGowen, Adrian Blackwater, MD    LOS - 1  Admit date - 04/28/2020    Chief Complaint  Patient presents with  . Weakness       Brief Narrative (HPI from H&P)   Tim [name] is a 79 y.o. 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   Subjective:   Patient in bed, appears comfortable, denies any headache, no fever, no chest pain or pressure, no shortness of breath , no abdominal pain. No focal weakness.   Assessment  & Plan :    1. 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 On chronic amoxicillin treatment under the care of Dr. Drucilla Schmidt ID. Now 1.3 x 1 cm mobile vegetation attached to the anterior mitral leaflet on TTE.  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  Although present blood cultures which are still pending, continue empiric IV Rocephin 2 g and ampicillin.  Thereafter per ID.  Also consulted cardiology, cardiology will see the patient on Monday after TEE per their team.    2. Paroxysmal A. fib. [place] vas score 2 score of greater than 3. Continue Eliquis, resume beta-blocker as blood pressure has improved .  3. Ischemic  cardiomyopathy with chronic systolic heart failure EF around 40%. He has chronic 2+ leg edema, pressure is improved low-dose beta-blocker along with diuretics to be started, holding Entresto for now.  4. Anemia of chronic disease. Stable follow.  5. COPD. Stable no wheezing.  6. BPH. Resume alpha-blocker.  7.  Hypomagnesemia.  Replaced.    8. Thrombocytopenia. Likely due to infection monitor.  Lab Results  Component Value Date   PLT 91 (L) 04/30/2020    8. Prediabetes. Monitor. Add ISS as he stress-induced hyperglycemia.  Lab Results  Component Value Date   HGBA1C 6.1 (H) 04/29/2020   CBG (last 3)  Recent Labs    04/29/20 1721 04/29/20 2114 04/30/20 0624  GLUCAP 103* 133* 112*   Lab Results  Component Value Date   TSH 0.905 04/29/2020      Condition - Extremely Guarded  Family Communication  :  Wife bedside 04/29/20, called [phone] 04/30/2020 at 10:30 AM and left message  Code Status : DNR  Consults  :  ID  Procedures  :    CT L Foot - no abscess  TTE - 1. Left ventricular ejection fraction, by estimation, is 55 to 60%. The left ventricle has normal function. The left ventricle has no regional wall motion abnormalities. Left ventricular diastolic parameters are consistent with Grade I diastolic dysfunction (impaired relaxation).  2. Right ventricular systolic function is normal. The right ventricular size is mildly enlarged. Tricuspid regurgitation signal is inadequate for assessing PA pressure.  3. Left atrial size was mildly dilated.  4. The mitral valve is  degenerative. No evidence of mitral valve regurgitation. Mild mitral stenosis. The mean mitral valve gradient is 5.0 mmHg. Moderate mitral annular calcification. There is a 1.3 x 1 cm mobile vegetation attached to the anterior mitral leaflet.  5. The aortic valve was not well visualized. Aortic valve regurgitation is not visualized. Mild aortic valve stenosis. Aortic valve mean gradient measures 16.0 mmHg.  6. The inferior vena cava is normal in size with greater than 50% respiratory variability, suggesting right atrial pressure of 3 mmHg.  7. Technically difficult study with very poor windows. Possible mitral valve vegetation. The tricuspid valve, aortic valve, and ICD were poorly visualized  PUD Prophylaxis : PPI  Disposition Plan  :    Status is: Inpatient  Remains inpatient appropriate because:IV treatments appropriate due to intensity of illness or inability to take PO   Dispo: The patient is from: Home              Anticipated d/c is to: Home              Anticipated d/c date is: > 3 days              Patient currently is not medically stable to d/c.  DVT Prophylaxis  : Eliquis  Lab Results  Component Value Date   PLT 91 (L) 04/30/2020    Diet :  Diet Order            Diet Heart Room service appropriate? Yes; Fluid consistency: Thin  Diet effective now                  Inpatient Medications  Scheduled Meds: . allopurinol  100 mg Oral BID  . apixaban  5 mg Oral BID  . ezetimibe  10 mg Oral Daily  . finasteride  5 mg Oral Daily  . gabapentin  800 mg Oral QID  . influenza vaccine adjuvanted  0.5 mL Intramuscular  Tomorrow-1000  . insulin aspart  0-9 Units Subcutaneous TID WC  . metoprolol succinate  25 mg Oral Daily  . pantoprazole  40 mg Oral Q1200  . rosuvastatin  40 mg Oral QHS   Continuous Infusions: . ampicillin (OMNIPEN) IV 2 g (04/30/20 0956)  . cefTRIAXone (ROCEPHIN)  IV 2 g (04/30/20 0037)  . magnesium sulfate bolus IVPB 2 g (04/30/20 1013)   PRN  Meds:.albuterol  Antibiotics  :    Anti-infectives (From admission, onward)   Start     Dose/Rate Route Frequency Ordered Stop   04/29/20 2200  cefTRIAXone (ROCEPHIN) 2 g in sodium chloride 0.9 % 100 mL IVPB  Status:  Discontinued        2 g 200 mL/hr over 30 Minutes Intravenous Every 24 hours 04/29/20 0013 04/29/20 1206   04/29/20 1600  ampicillin (OMNIPEN) 2 g in sodium chloride 0.9 % 100 mL IVPB        2 g 300 mL/hr over 20 Minutes Intravenous Every 4 hours 04/29/20 1206     04/29/20 1300  cefTRIAXone (ROCEPHIN) 2 g in sodium chloride 0.9 % 100 mL IVPB        2 g 200 mL/hr over 30 Minutes Intravenous Every 12 hours 04/29/20 1206     04/29/20 0600  ampicillin (OMNIPEN) 2 g in sodium chloride 0.9 % 100 mL IVPB  Status:  Discontinued        2 g 300 mL/hr over 20 Minutes Intravenous Every 6 hours 04/29/20 0013 04/29/20 1206   04/28/20 2215  cefTRIAXone (ROCEPHIN) 1 g in sodium chloride 0.9 % 100 mL IVPB  Status:  Discontinued        1 g 200 mL/hr over 30 Minutes Intravenous  Once 04/28/20 2201 04/28/20 2211   04/28/20 2215  ampicillin (OMNIPEN) 2 g in sodium chloride 0.9 % 100 mL IVPB        2 g 300 mL/hr over 20 Minutes Intravenous  Once 04/28/20 2201 04/29/20 0027   04/28/20 2215  cefTRIAXone (ROCEPHIN) 2 g in sodium chloride 0.9 % 100 mL IVPB        2 g 200 mL/hr over 30 Minutes Intravenous  Once 04/28/20 2211 04/28/20 2314       Time Spent in minutes  30   Lala Lund M.D on 04/30/2020 at 10:29 AM  To page go to [email or web address] - password [facility]  [facility] -  Office  [phone]   See all Orders from today for further details    Objective:   Vitals:   04/30/20 0001 04/30/20 0358 04/30/20 0850 04/30/20 1014  BP: (!) 109/32 127/71 136/62 (!) 138/49  Pulse: 70  77 64  Resp: 18 16 12    Temp: 98.5 F (36.9 C) 98.1 F (36.7 C) 98.2 F (36.8 C)   TempSrc: Axillary Oral Oral   SpO2: 99% 98% 96%   Weight:      Height:        Wt Readings from Last 3  Encounters:  04/28/20 (!) 145.2 kg  04/21/20 (!) 141.5 kg  04/19/20 (!) 145.2 kg     Intake/Output Summary (Last 24 hours) at 04/30/2020 1029 Last data filed at 04/30/2020 0523 Gross per 24 hour  Intake 323.81 ml  Output 1600 ml  Net -1276.19 ml     Physical Exam  Awake Alert, No new F.N deficits, Normal affect Long.AT,PERRAL Supple Neck,No JVD, No cervical lymphadenopathy appriciated.  Symmetrical Chest wall movement, Good air movement bilaterally, CTAB RRR,No Gallops, +ve systolic murmur +  ve B.Sounds, Abd Soft, No tenderness, No organomegaly appriciated, No rebound - guarding or rigidity. No Cyanosis, left foot transmetatarsal amputation with a small plantar surface chronic shallow ulcer without any signs of active infection overlying first metatarsal head. He has chronic 2+ edema in both lower extremities left more than right.    Data Review:    CBC Recent Labs  Lab 04/28/20 2118 04/29/20 0759 04/30/20 0202  WBC 16.0* 15.0* 7.0  HGB 11.7* 10.4* 10.5*  HCT 36.6* 33.6* 32.7*  PLT 106* 99* 91*  MCV 93.4 93.6 90.8  MCH 29.8 29.0 29.2  MCHC 32.0 31.0 32.1  RDW 14.7 14.7 14.7  LYMPHSABS 0.6*  --  1.3  MONOABS 1.4*  --  0.8  EOSABS 0.1  --  0.1  BASOSABS 0.0  --  0.0    Recent Labs  Lab 04/28/20 2118 04/28/20 2133 04/28/20 2338 04/29/20 0759 04/29/20 0801 04/29/20 0828 04/30/20 0202  NA 140  --   --  141  --   --  140  K 4.6  --   --  4.0  --   --  4.0  CL 107  --   --  110  --   --  109  CO2 23  --   --  23  --   --  25  GLUCOSE 161*  --   --  125*  --   --  127*  BUN 23  --   --  22  --   --  18  CREATININE 1.13  --   --  1.09  --   --  0.90  CALCIUM 8.6*  --   --  8.3*  --   --  8.6*  AST  --   --   --  26  --   --  35  ALT  --   --   --  21  --   --  23  ALKPHOS  --   --   --  48  --   --  48  BILITOT  --   --   --  0.8  --   --  0.7  ALBUMIN  --   --   --  2.7*  --   --  2.8*  MG  --   --   --  1.4*  --   --  1.6*  CRP  --   --   --   --   --    --  4.0*  DDIMER  --   --   --  1.33*  --   --  0.93*  PROCALCITON  --   --   --  0.77  --   --  0.55  LATICACIDVEN  --  1.8 2.0*  --  1.3  --   --   TSH  --   --   --   --   --  0.905  --   HGBA1C  --   --   --  6.1*  --   --   --   BNP  --   --   --  82.7  --   --  82.6    ------------------------------------------------------------------------------------------------------------------ No results for input(s): CHOL, HDL, LDLCALC, TRIG, CHOLHDL, LDLDIRECT in the last 72 hours.  Lab Results  Component Value Date   HGBA1C 6.1 (H) 04/29/2020   ------------------------------------------------------------------------------------------------------------------ Recent Labs    04/29/20 0828  TSH 0.905    Cardiac Enzymes No results  for input(s): CKMB, TROPONINI, MYOGLOBIN in the last 168 hours.  Invalid input(s): CK ------------------------------------------------------------------------------------------------------------------    Component Value Date/Time   BNP 82.6 04/30/2020 0202   BNP 32.0 01/14/2014 1656    Micro Results Recent Results (from the past 240 hour(s))  Respiratory Panel by RT PCR (Flu A&B, Covid) - Nasopharyngeal Swab     Status: None   Collection Time: 04/28/20 10:43 PM   Specimen: Nasopharyngeal Swab  Result Value Ref Range Status   SARS Coronavirus 2 by RT PCR NEGATIVE NEGATIVE Final    Comment: (NOTE) SARS-CoV-2 target nucleic acids are NOT DETECTED.  The SARS-CoV-2 RNA is generally detectable in upper respiratoy specimens during the acute phase of infection. The lowest concentration of SARS-CoV-2 viral copies this assay can detect is 131 copies/mL. A negative result does not preclude SARS-Cov-2 infection and should not be used as the sole basis for treatment or other patient management decisions. A negative result may occur with  improper specimen collection/handling, submission of specimen other than nasopharyngeal swab, presence of viral mutation(s)  within the areas targeted by this assay, and inadequate number of viral copies (<131 copies/mL). A negative result must be combined with clinical observations, patient history, and epidemiological information. The expected result is Negative.  Fact Sheet for Patients:  PinkCheek.be  Fact Sheet for Healthcare Providers:  GravelBags.it  This test is no t yet approved or cleared by the [place] FDA and  has been authorized for detection and/or diagnosis of SARS-CoV-2 by FDA under an Emergency Use Authorization (EUA). This EUA will remain  in effect (meaning this test can be used) for the duration of the COVID-19 declaration under Section 564(b)(1) of the Act, 21 U.S.C. section 360bbb-3(b)(1), unless the authorization is terminated or revoked sooner.     Influenza A by PCR NEGATIVE NEGATIVE Final   Influenza B by PCR NEGATIVE NEGATIVE Final    Comment: (NOTE) The Xpert Xpress SARS-CoV-2/FLU/RSV assay is intended as an aid in  the diagnosis of influenza from Nasopharyngeal swab specimens and  should not be used as a sole basis for treatment. Nasal washings and  aspirates are unacceptable for Xpert Xpress SARS-CoV-2/FLU/RSV  testing.  Fact Sheet for Patients: PinkCheek.be  Fact Sheet for Healthcare Providers: GravelBags.it  This test is not yet approved or cleared by the [place] FDA and  has been authorized for detection and/or diagnosis of SARS-CoV-2 by  FDA under an Emergency Use Authorization (EUA). This EUA will remain  in effect (meaning this test can be used) for the duration of the  Covid-19 declaration under Section 564(b)(1) of the Act, 21  U.S.C. section 360bbb-3(b)(1), unless the authorization is  terminated or revoked. Performed at [facility], [place] [address], [place], [place] [address]     Radiology Reports CT FOOT LEFT WO  CONTRAST  Result Date: 04/29/2020 CLINICAL DATA:  Question of osteomyelitis EXAM: CT OF THE LEFT FOOT WITHOUT CONTRAST TECHNIQUE: Multidetector CT imaging of the left foot was performed according to the standard protocol. Multiplanar CT image reconstructions were also generated. COMPARISON:  None. FINDINGS: Bones/Joint/Cartilage The patient is status post transmetatarsal first through fifth digit amputation. There is no definite areas of cortical destruction or periosteal reaction noted. The amputation sites appear to be grossly intact. 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 Calcaneal enthesophyte seen on the plantar surface. There is diffuse osteopenia. Ligaments Suboptimally assessed by CT. Muscles and Tendons There is diffuse fatty atrophy of the muscles surrounding the forefoot. The  flexor extensor tendons appear to be grossly intact. The plantar fascia appears to be intact. Soft tissues Overlying the plantar the near the first metatarsal amputation site there is a focal area of superficial ulceration with diffuse skin thickening and subcutaneous edema. No definite loculated fluid collection or subcutaneous emphysema is seen. IMPRESSION: Superficial ulceration along the plantar base beneath the first metatarsal amputation site with cellulitis and postsurgical changes. No definite loculated fluid collection or subcutaneous emphysema. Status post transmetatarsal amputation of the first through fifth digits without definite evidence of osteomyelitis. Electronically Signed   By: Prudencio Pair M.D.   On: 04/29/2020 03:30   DG Chest Port 1 View  Result Date: 04/28/2020 CLINICAL DATA:  Chest pain, weakness and dizziness. EXAM: PORTABLE CHEST 1 VIEW COMPARISON:  01/20/2020 FINDINGS: Mild patient rotation. Multi lead left-sided pacemaker. Cardiomegaly slightly increased from prior exam. Unchanged mediastinal contours. Aortic atherosclerosis. Mild vascular engorgement. No pulmonary edema.  No focal airspace disease. No large pleural effusion. No pneumothorax. The bones appear under mineralized. Spinal stimulator in place. Multiple overlying monitoring devices. No acute osseous abnormalities are seen. IMPRESSION: Cardiomegaly with mild vascular congestion. Electronically Signed   By: Keith Rake M.D.   On: 04/28/2020 21:40   ECHOCARDIOGRAM COMPLETE  Result Date: 04/29/2020    ECHOCARDIOGRAM REPORT   Patient Name:   CARMELO REIDEL Date of Exam: 04/29/2020 Medical Rec #:  [id number]       Height:       72.0 in Accession #:    [id number]      Weight:       320.0 lb Date of Birth:  January 03, 1941       BSA:          2.602 m Patient Age:    44 years        BP:           127/96 mmHg Patient Gender: M               HR:           66 bpm. Exam Location:  Inpatient Procedure: 2D Echo and Intracardiac Opacification Agent Indications:    endocarditis  History:        Patient has prior history of Echocardiogram examinations, most                 recent 01/16/2020. CAD, Defibrillator, COPD,                 Signs/Symptoms:Fever; Risk Factors:Sleep Apnea.  Sonographer:    Johny Chess Referring Phys: Graylin Shiver [facility]  Sonographer Comments: Suboptimal parasternal window and patient is morbidly obese. Image acquisition challenging due to patient body habitus. IMPRESSIONS  1. Left ventricular ejection fraction, by estimation, is 55 to 60%. The left ventricle has normal function. The left ventricle has no regional wall motion abnormalities. Left ventricular diastolic parameters are consistent with Grade I diastolic dysfunction (impaired relaxation).  2. Right ventricular systolic function is normal. The right ventricular size is mildly enlarged. Tricuspid regurgitation signal is inadequate for assessing PA pressure.  3. Left atrial size was mildly dilated.  4. The mitral valve is degenerative. No evidence of mitral valve regurgitation. Mild mitral stenosis. The mean mitral valve gradient is 5.0 mmHg.  Moderate mitral annular calcification. There is a 1.3 x 1 cm mobile vegetation attached to the anterior mitral leaflet.  5. The aortic valve was not well visualized. Aortic valve regurgitation is not visualized. Mild aortic valve stenosis. Aortic valve mean  gradient measures 16.0 mmHg.  6. The inferior vena cava is normal in size with greater than 50% respiratory variability, suggesting right atrial pressure of 3 mmHg.  7. Technically difficult study with very poor windows. Possible mitral valve vegetation. The tricuspid valve, aortic valve, and ICD were poorly visualized. Suggest TEE. FINDINGS  Left Ventricle: Left ventricular ejection fraction, by estimation, is 55 to 60%. The left ventricle has normal function. The left ventricle has no regional wall motion abnormalities. Definity contrast agent was given IV to delineate the left ventricular  endocardial borders. The left ventricular internal cavity size was normal in size. There is no left ventricular hypertrophy. Left ventricular diastolic parameters are consistent with Grade I diastolic dysfunction (impaired relaxation). Right Ventricle: The right ventricular size is mildly enlarged. No increase in right ventricular wall thickness. Right ventricular systolic function is normal. Tricuspid regurgitation signal is inadequate for assessing PA pressure. Left Atrium: Left atrial size was mildly dilated. Right Atrium: Right atrial size was normal in size. Pericardium: There is no evidence of pericardial effusion. Mitral Valve: The mitral valve is degenerative in appearance. There is moderate thickening of the mitral valve leaflet(s). Moderate mitral annular calcification. No evidence of mitral valve regurgitation. Mild mitral valve stenosis. MV peak gradient, 10.1 mmHg. The mean mitral valve gradient is 5.0 mmHg. Tricuspid Valve: The tricuspid valve is not well visualized. Tricuspid valve regurgitation is not demonstrated. Aortic Valve: The aortic valve was not well  visualized. Aortic valve regurgitation is not visualized. Mild aortic stenosis is present. Aortic valve mean gradient measures 16.0 mmHg. Aortic valve peak gradient measures 30.9 mmHg. Pulmonic Valve: The pulmonic valve was not well visualized. Pulmonic valve regurgitation is not visualized. Aorta: Aortic root could not be assessed. Venous: The inferior vena cava is normal in size with greater than 50% respiratory variability, suggesting right atrial pressure of 3 mmHg. IAS/Shunts: No atrial level shunt detected by color flow Doppler. Additional Comments: A pacer wire is visualized in the right ventricle.   Diastology LV e' medial:    6.74 cm/s LV E/e' medial:  18.4 LV e' lateral:   8.05 cm/s LV E/e' lateral: 15.4  RIGHT VENTRICLE             IVC RV S prime:     15.60 cm/s  IVC diam: 1.70 cm TAPSE (M-mode): 3.0 cm LEFT ATRIUM             Index LA Vol (A2C):   78.1 ml 30.02 ml/m LA Vol (A4C):   90.2 ml 34.67 ml/m LA Biplane Vol: 91.5 ml 35.17 ml/m  AORTIC VALVE AV Vmax:           278.00 cm/s AV Vmean:          182.000 cm/s AV VTI:            0.576 m AV Peak Grad:      30.9 mmHg AV Mean Grad:      16.0 mmHg LVOT Vmax:         119.00 cm/s LVOT Vmean:        81.800 cm/s LVOT VTI:          0.236 m LVOT/AV VTI ratio: 0.41 MITRAL VALVE MV Area (PHT): 2.05 cm     SHUNTS MV Peak grad:  10.1 mmHg    Systemic VTI: 0.24 m MV Mean grad:  5.0 mmHg MV Vmax:       1.59 m/s MV Vmean:      100.0 cm/s MV Decel Time: 370  msec MV E velocity: 124.00 cm/s MV A velocity: 132.00 cm/s MV E/A ratio:  0.94 Loralie Champagne MD Electronically signed by Loralie Champagne MD Signature Date/Time: 04/29/2020/6:03:22 PM    Final    [name] EKG SITE RITE  Result Date: 04/30/2020 If Site Rite image not attached, placement could not be confirmed due to current cardiac rhythm.

## 2020-04-30 NOTE — Progress Notes (Signed)
° ° °  [facility] has been requested to perform a transesophageal echocardiogram on Tim [name] for bacteremia.  After careful review of history and examination, the risks and benefits of transesophageal echocardiogram have been explained including risks of esophageal damage, perforation (1:10,000 risk), bleeding, pharyngeal hematoma as well as other potential complications associated with conscious sedation including aspiration, arrhythmia, respiratory failure and death. Alternatives to treatment were discussed, questions were answered. Patient is willing to proceed.   Kathyrn Drown, NP  04/30/2020 3:17 PM

## 2020-04-30 NOTE — Progress Notes (Signed)
Per Dr. Linus Salmons okay to place PICC.

## 2020-04-30 NOTE — Progress Notes (Addendum)
Patient is an 79 year old gentleman well-known to our practice.  He is 8 months status post left transmetatarsal amputation.  He was last evaluated by Dr. Sharol Given 11 days ago.  He had a concern that he had a small open wound at the amputation stump site on the plantar surface.  He was supposed to get an MRI I believe he cannot get an MRI so a CT scan was ordered.  This was completed yesterday.  Patient is sitting in the room with his wife.  He is comfortable.  His bilateral lower extremities have a lot of pitting edema some erythema to the left lower extremity.  Amputation stump on the left is well-healed.  He has a 3 cm area that is callused and in the central portion about a 1 cm area of ulcer.  There is vascular granulation tissue.  This appears healthy.  He does not have any surrounding cellulitis in this area of the foot or fluctuance.  CT did not show any evidence of bony destruction or abscess.   Would continue with daily dressing changes may cleanse the area with antibacterial soap and water.  With regards to his lower extremities he does wear CircAid wraps which his wife has with her these may be applied and removed for showering.  We will continue to follow.  Dr. Sharol Given is out of town however reviewed patient and pictures with him.  I spoke with Dr. Sharol Given.  He would like the patient to have Profore wraps instead of the CircAid wraps to provide more compression.  Will order these through wound care nurse

## 2020-04-30 NOTE — Progress Notes (Signed)
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  No positive blood cultures.  No fever now, WBC wnl, down from 16.  Feels well overall.  Day 4 antibiotics - ampicillin and ceftriaxone   Physical Exam: Constitutional:  Vitals:   04/30/20 0358 04/30/20 0850  BP: 127/71 136/62  Pulse:  77  Resp: 16 12  Temp: 98.1 F (36.7 C) 98.2 F (36.8 C)  SpO2: 98% 96%   patient appears in NAD Respiratory: Normal respiratory effort; CTA B Cardiovascular: RRR GI: soft, nt, nd  Review of Systems: Constitutional: negative for fevers and chills Gastrointestinal: negative for nausea  Lab Results  Component Value Date   WBC 7.0 04/30/2020   HGB 10.5 (L) 04/30/2020   HCT 32.7 (L) 04/30/2020   MCV 90.8 04/30/2020   PLT 91 (L) 04/30/2020    Lab Results  Component Value Date   CREATININE 0.90 04/30/2020   BUN 18 04/30/2020   NA 140 04/30/2020   K 4.0 04/30/2020   CL 109 04/30/2020   CO2 25 04/30/2020    Lab Results  Component Value Date   ALT 23 04/30/2020   AST 35 04/30/2020   ALKPHOS 48 04/30/2020     Microbiology: Recent Results (from the past 240 hour(s))  Respiratory Panel by RT PCR (Flu A&B, Covid) - Nasopharyngeal Swab     Status: None   Collection Time: 04/28/20 10:43 PM   Specimen: Nasopharyngeal Swab  Result Value Ref Range Status   SARS Coronavirus 2 by RT PCR NEGATIVE NEGATIVE Final    Comment: (NOTE) SARS-CoV-2 target nucleic acids are NOT DETECTED.  The SARS-CoV-2 RNA is generally detectable in upper respiratoy specimens during the acute phase of infection. The lowest concentration of SARS-CoV-2 viral copies this assay can detect is 131 copies/mL. A negative result does not preclude SARS-Cov-2 infection and should not be used as the sole basis for treatment or other patient management decisions. A negative result may occur with  improper specimen  collection/handling, submission of specimen other than nasopharyngeal swab, presence of viral mutation(s) within the areas targeted by this assay, and inadequate number of viral copies (<131 copies/mL). A negative result must be combined with clinical observations, patient history, and epidemiological information. The expected result is Negative.  Fact Sheet for Patients:  PinkCheek.be  Fact Sheet for Healthcare Providers:  GravelBags.it  This test is no t yet approved or cleared by the [place] FDA and  has been authorized for detection and/or diagnosis of SARS-CoV-2 by FDA under an Emergency Use Authorization (EUA). This EUA will remain  in effect (meaning this test can be used) for the duration of the COVID-19 declaration under Section 564(b)(1) of the Act, 21 U.S.C. section 360bbb-3(b)(1), unless the authorization is terminated or revoked sooner.     Influenza A by PCR NEGATIVE NEGATIVE Final   Influenza B by PCR NEGATIVE NEGATIVE Final    Comment: (NOTE) The Xpert Xpress SARS-CoV-2/FLU/RSV assay is intended as an aid in  the diagnosis of influenza from Nasopharyngeal swab specimens and  should not be used as a sole basis for treatment. Nasal washings and  aspirates are unacceptable for Xpert Xpress SARS-CoV-2/FLU/RSV  testing.  Fact Sheet for Patients: PinkCheek.be  Fact Sheet for Healthcare Providers: GravelBags.it  This test is not yet approved or cleared by the [place] FDA and  has been authorized for detection and/or diagnosis of SARS-CoV-2 by  FDA under an Emergency Use Authorization (EUA). This EUA will remain  in effect (meaning this test can be used) for the duration of the  Covid-19 declaration under Section 564(b)(1) of the Act, 21  U.S.C. section 360bbb-3(b)(1), unless the authorization is  terminated or revoked. 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  I recommend a TEE for better evaluation of the AV and the ICD to determine if removal of ICD should be considered.  I have alerted the cardmaster.   Will also follow blood cultures to be sure it is nothing new  2.  Foot wound - concern for infection there and orthopedics to evaluate.    3.  Leukocytosis - improving with current therapy.    Above discussed with the patient and wife at the bedside.

## 2020-04-30 NOTE — Progress Notes (Addendum)
Physical Therapy Treatment Patient Details Name: Tim [name] MRN: [id number] DOB: February 10, 1941 Today's Date: 04/30/2020    History of Present Illness 79 y.o. 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    PT Comments    Pt seated in recliner.  Pt performed gt training and functional mobility this session.  Post session required toileting.  He continues to make functional gains but remains limited due to fatigue and weakness from hospitalization.  He is currently on diuretics and this should improve his function as well.  Continue to recommended no PT follow up.     Follow Up Recommendations  No PT follow up;Supervision - Intermittent     Equipment Recommendations  None recommended by PT    Recommendations for Other Services       Precautions / Restrictions Precautions Precautions: Fall Restrictions Weight Bearing Restrictions: No    Mobility  Bed Mobility               General bed mobility comments: Pt seated in recliner chair on arrival.  Transfers Overall transfer level: Needs assistance Equipment used: 4-wheeled walker (wide) Transfers: Sit to/from Stand Sit to Stand: Min guard         General transfer comment: Cues for hand placement to and from seated surface.  Pt slow to rise but presents  with good eccentric load.  Performed multiple transfers from bed to bed side commode and back  Ambulation/Gait Ambulation/Gait assistance: Supervision Gait Distance (Feet): 150 Feet Assistive device: 4-wheeled walker (wide) Gait Pattern/deviations: Step-through pattern;Trunk flexed;Shuffle Gait velocity: reduced   General Gait Details: Pt with kyphotic posturing at baseline.  Pt able to perform increased gt training this session.  Standing rest periods x 2 due to fatigue.   Stairs Stairs: Yes Stairs assistance: Min assist Stair Management: Two rails;Forwards;Backwards;Step to pattern Number of Stairs: 4 General stair comments: Cues for sequencing and hand placement on rails.  Forward to ascend and backwards to descend.  Increased effort from patient to perform.   Wheelchair Mobility    Modified Rankin (Stroke Patients Only)       Balance Overall balance assessment: Needs assistance Sitting-balance support: No upper extremity supported;Feet supported Sitting balance-Leahy Scale: Good Sitting balance - Comments: supervision     Standing balance-Leahy Scale: Fair Standing balance comment: reliant on UE support of assistive device                            Cognition Arousal/Alertness: Awake/alert Behavior During Therapy: WFL for tasks assessed/performed Overall Cognitive Status: Within Functional Limits for tasks assessed                                        Exercises      General Comments  Pertinent Vitals/Pain Pain Assessment: Faces Faces Pain Scale: Hurts a little bit Pain Location: back, chronic Pain Descriptors / Indicators: Aching Pain Intervention(s): Monitored during session    Home Living                      Prior Function            PT Goals (current goals can now be found in the care plan section) Acute Rehab PT Goals Potential to Achieve Goals: Good Progress towards PT goals: Progressing toward  goals    Frequency    Min 3X/week      PT Plan Current plan remains appropriate    Co-evaluation              AM-PAC PT "6 Clicks" Mobility   Outcome Measure  Help needed turning from your back to your side while in a flat bed without using bedrails?: None Help needed moving from lying on your back to sitting on the side of a flat bed without using bedrails?: None Help needed moving to and from a bed to a chair (including a wheelchair)?: A Little Help needed standing up from a chair using your arms (e.g., wheelchair or bedside chair)?: A Little Help needed to walk in hospital room?: A Little Help needed climbing 3-5 steps with a railing? : A Little 6 Click Score: 20    End of Session Equipment Utilized During Treatment: Gait belt Activity Tolerance: Patient tolerated treatment well Patient left: with call bell/phone within reach;with family/visitor present;in chair Nurse Communication: Mobility status PT Visit Diagnosis: Other abnormalities of gait and mobility (R26.89);Muscle weakness (generalized) (M62.81)     Time: 1240-1317 PT Time Calculation (min) (ACUTE ONLY): 37 min  Charges:  $Gait Training: 8-22 mins $Therapeutic Activity: 8-22 mins                     Tim [name] , PTA Acute Rehabilitation Services Pager [phone] Office [phone]     Tim [name] 04/30/2020, 1:54 PM

## 2020-04-30 NOTE — Progress Notes (Deleted)
Spoke with Primary RN, PICC order received and will be done tomorrow 10/30.

## 2020-04-30 NOTE — Consult Note (Signed)
[place] Nurse Consult Note: Patient receiving care in [place] Reason for Consult: Application of Profore wraps  Bilateral 4 layer Profore wraps applied at 50% stretch. Patient tolerated very well.  [place] team will revisit next Tues and re-wrap if patient is still in the hospital  Thank you for the consult. Please re-consult the [place] team if needed.  Cathlean Marseilles Tamala Julian, MSN, RN, [place], Lysle Pearl, [name] Wound Treatment Associate Pager [phone]

## 2020-05-01 DIAGNOSIS — R079 Chest pain, unspecified: Secondary | ICD-10-CM

## 2020-05-01 LAB — CBC WITH DIFFERENTIAL/PLATELET
Abs Immature Granulocytes: 0.01 10*3/uL (ref 0.00–0.07)
Basophils Absolute: 0 10*3/uL (ref 0.0–0.1)
Basophils Relative: 0 %
Eosinophils Absolute: 0.2 10*3/uL (ref 0.0–0.5)
Eosinophils Relative: 3 %
HCT: 30.7 % — ABNORMAL LOW (ref 39.0–52.0)
Hemoglobin: 9.9 g/dL — ABNORMAL LOW (ref 13.0–17.0)
Immature Granulocytes: 0 %
Lymphocytes Relative: 24 %
Lymphs Abs: 1.3 10*3/uL (ref 0.7–4.0)
MCH: 28.8 pg (ref 26.0–34.0)
MCHC: 32.2 g/dL (ref 30.0–36.0)
MCV: 89.2 fL (ref 80.0–100.0)
Monocytes Absolute: 0.8 10*3/uL (ref 0.1–1.0)
Monocytes Relative: 14 %
Neutro Abs: 3.3 10*3/uL (ref 1.7–7.7)
Neutrophils Relative %: 59 %
Platelets: 98 10*3/uL — ABNORMAL LOW (ref 150–400)
RBC: 3.44 MIL/uL — ABNORMAL LOW (ref 4.22–5.81)
RDW: 14.6 % (ref 11.5–15.5)
WBC: 5.7 10*3/uL (ref 4.0–10.5)
nRBC: 0 % (ref 0.0–0.2)

## 2020-05-01 LAB — COMPREHENSIVE METABOLIC PANEL
ALT: 23 U/L (ref 0–44)
AST: 28 U/L (ref 15–41)
Albumin: 2.8 g/dL — ABNORMAL LOW (ref 3.5–5.0)
Alkaline Phosphatase: 44 U/L (ref 38–126)
Anion gap: 9 (ref 5–15)
BUN: 21 mg/dL (ref 8–23)
CO2: 26 mmol/L (ref 22–32)
Calcium: 8.5 mg/dL — ABNORMAL LOW (ref 8.9–10.3)
Chloride: 105 mmol/L (ref 98–111)
Creatinine, Ser: 1.27 mg/dL — ABNORMAL HIGH (ref 0.61–1.24)
GFR, Estimated: 57 mL/min — ABNORMAL LOW (ref >60)
Glucose, Bld: 121 mg/dL — ABNORMAL HIGH (ref 70–99)
Potassium: 3.7 mmol/L (ref 3.5–5.1)
Sodium: 140 mmol/L (ref 135–145)
Total Bilirubin: 0.5 mg/dL (ref 0.3–1.2)
Total Protein: 5.6 g/dL — ABNORMAL LOW (ref 6.5–8.1)

## 2020-05-01 LAB — GLUCOSE, CAPILLARY
Glucose-Capillary: 104 mg/dL — ABNORMAL HIGH (ref 70–99)
Glucose-Capillary: 120 mg/dL — ABNORMAL HIGH (ref 70–99)

## 2020-05-01 LAB — BRAIN NATRIURETIC PEPTIDE: B Natriuretic Peptide: 94 pg/mL (ref 0.0–100.0)

## 2020-05-01 LAB — D-DIMER, QUANTITATIVE: D-Dimer, Quant: 0.79 ug/mL-FEU — ABNORMAL HIGH (ref 0.00–0.50)

## 2020-05-01 LAB — C-REACTIVE PROTEIN: CRP: 1.9 mg/dL — ABNORMAL HIGH (ref <1.0)

## 2020-05-01 LAB — MAGNESIUM: Magnesium: 1.6 mg/dL — ABNORMAL LOW (ref 1.7–2.4)

## 2020-05-01 LAB — PROCALCITONIN: Procalcitonin: 0.43 ng/mL

## 2020-05-01 LAB — TROPONIN I (HIGH SENSITIVITY): Troponin I (High Sensitivity): 8 ng/L (ref <18)

## 2020-05-01 MED ORDER — NITROGLYCERIN 0.4 MG SL SUBL: 0.4000 mg | SUBLINGUAL_TABLET | SUBLINGUAL | Status: DC | PRN

## 2020-05-01 MED ORDER — TORSEMIDE 20 MG PO TABS: 20.0000 mg | ORAL_TABLET | Freq: Two times a day (BID) | ORAL | Status: DC

## 2020-05-01 MED ORDER — ISOSORBIDE MONONITRATE ER 30 MG PO TB24
30.0000 mg | ORAL_TABLET | Freq: Every day | ORAL | Status: DC
  Administered 2020-05-01 – 2020-05-07 (×6): 30 mg via ORAL
  Filled 2020-05-01 (×7): qty 1

## 2020-05-01 MED ORDER — MAGNESIUM SULFATE 2 GM/50ML IV SOLN
2.0000 g | Freq: Once | INTRAVENOUS | Status: AC
  Administered 2020-05-01: 2 g via INTRAVENOUS
  Filled 2020-05-01: qty 50

## 2020-05-01 MED ORDER — MAGNESIUM OXIDE 400 (241.3 MG) MG PO TABS
400.0000 mg | ORAL_TABLET | Freq: Every day | ORAL | Status: DC
  Administered 2020-05-01 – 2020-05-07 (×6): 400 mg via ORAL
  Filled 2020-05-01 (×7): qty 1

## 2020-05-01 MED ORDER — NITROGLYCERIN 0.4 MG SL SUBL
SUBLINGUAL_TABLET | SUBLINGUAL | Status: AC
  Administered 2020-05-01: 0.4 mg
  Filled 2020-05-01: qty 1

## 2020-05-01 MED ORDER — MORPHINE SULFATE (PF) 2 MG/ML IV SOLN: 2.0000 mg | Freq: Once | INTRAVENOUS | Status: DC

## 2020-05-01 NOTE — Progress Notes (Signed)
PROGRESS NOTE                                                                                                                                                                                                             Patient Demographics:    Tim [name], is a 79 y.o. male, DOB - 1941/06/16, RRN:[id number]  Outpatient Primary MD for the patient is McGowen, Adrian Blackwater, MD    LOS - 2  Admit date - 04/28/2020    Chief Complaint  Patient presents with  . Weakness       Brief Narrative (HPI from H&P)   Tim [name] is a 79 y.o. 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   Subjective:   Patient in bed, appears comfortable, denies any headache, no fever, no chest pain or pressure, no shortness of breath , no abdominal pain. No focal weakness.   Assessment  & Plan :    1. 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 On chronic amoxicillin treatment under the care of Dr. Drucilla Schmidt ID. Now 1.3 x 1 cm mobile vegetation attached to the anterior mitral leaflet on TTE.  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  Although present blood cultures which are still pending, continue empiric IV Rocephin 2 g and ampicillin.  Thereafter per ID.  Also consulted cardiology, cardiology will see the patient on Monday after TEE per their team.    2. Paroxysmal A. fib. [place] vas score 2 score of greater than 3. Continue Eliquis, resume beta-blocker as blood pressure has improved .  3. Ischemic  cardiomyopathy with chronic systolic heart failure EF around 40%. He has chronic 2+ leg edema, pressure is improved low-dose beta-blocker, diuretics as tolerated by blood pressure and renal function, he had a brief episode of chest pain on 05/01/2020 with unchanged EKG, add Imdur, undergoing TEE soon, holding Entresto for now due to AKI and soft blood pressures.  4. Anemia of chronic disease. Stable follow.  5. COPD. Stable no wheezing.  6. BPH. Resume alpha-blocker.  7.  Hypomagnesemia.  Replaced.    8.  AKI.  Hold diuretics and Entresto on 05/01/2020 and monitor.    9. Thrombocytopenia. Likely due to infection monitor.  Lab Results  Component Value Date   PLT 98 (L) 05/01/2020    8. Prediabetes. Monitor. Add ISS as he stress-induced hyperglycemia.  Lab Results  Component Value Date   HGBA1C 6.1 (H) 04/29/2020   CBG (last 3)  Recent Labs    04/30/20 1614 04/30/20 2117 05/01/20 0632  GLUCAP 135* 107* 104*   Lab Results  Component Value Date   TSH 0.905 04/29/2020      Condition - Extremely Guarded  Family Communication  :  Wife bedside 04/29/20, called [phone] 04/30/2020 at 10:30 AM and left message  Code Status : DNR  Consults  :  ID  Procedures  :    CT L Foot - no abscess  TTE - 1. Left ventricular ejection fraction, by estimation, is 55 to 60%. The left ventricle has normal function. The left ventricle has no regional wall motion abnormalities. Left ventricular diastolic parameters are consistent with Grade I diastolic dysfunction (impaired relaxation).  2.  Right ventricular systolic function is normal. The right ventricular size is mildly enlarged. Tricuspid regurgitation signal is inadequate for assessing PA pressure.  3. Left atrial size was mildly dilated.  4. The mitral valve is degenerative. No evidence of mitral valve regurgitation. Mild mitral stenosis. The mean mitral valve gradient is 5.0 mmHg. Moderate mitral annular calcification. There is a 1.3 x 1 cm mobile vegetation attached to the anterior mitral leaflet.  5. The aortic valve was not well visualized. Aortic valve regurgitation is not visualized. Mild aortic valve stenosis. Aortic valve mean gradient measures 16.0 mmHg.  6. The inferior vena cava is normal in size with greater than 50% respiratory variability, suggesting right atrial pressure of 3 mmHg.  7. Technically difficult study with very poor windows. Possible mitral valve vegetation. The tricuspid valve, aortic valve, and ICD were poorly visualized  PUD Prophylaxis : PPI  Disposition Plan  :    Status is: Inpatient  Remains inpatient appropriate because:IV treatments appropriate due to intensity of illness or inability to take PO   Dispo: The patient is from: Home              Anticipated d/c is to: Home              Anticipated d/c date is: > 3 days              Patient currently is not medically stable to d/c.  DVT Prophylaxis  : Eliquis  Lab Results  Component Value Date   PLT 98 (L) 05/01/2020    Diet :  Diet Order            Diet Heart Room service appropriate? Yes; Fluid consistency: Thin  Diet effective now                  Inpatient Medications  Scheduled Meds: . allopurinol  100 mg  Oral BID  . apixaban  5 mg Oral BID  . ezetimibe  10 mg Oral Daily  . finasteride  5 mg Oral Daily  . gabapentin  800 mg Oral QID  . insulin aspart  0-9 Units Subcutaneous TID WC  . metoprolol succinate  25 mg Oral Daily  . mometasone-formoterol  2 puff Inhalation BID  .  morphine injection  2 mg Intravenous Once    . pantoprazole  40 mg Oral Q1200  . rosuvastatin  40 mg Oral QHS  . spironolactone  25 mg Oral Daily  . torsemide  20 mg Oral BID   Continuous Infusions: . ampicillin (OMNIPEN) IV 2 g (05/01/20 0906)  . cefTRIAXone (ROCEPHIN)  IV 2 g (04/30/20 2224)  . magnesium sulfate bolus IVPB     PRN Meds:.albuterol  Antibiotics  :    Anti-infectives (From admission, onward)   Start     Dose/Rate Route Frequency Ordered Stop   04/29/20 2200  cefTRIAXone (ROCEPHIN) 2 g in sodium chloride 0.9 % 100 mL IVPB  Status:  Discontinued        2 g 200 mL/hr over 30 Minutes Intravenous Every 24 hours 04/29/20 0013 04/29/20 1206   04/29/20 1600  ampicillin (OMNIPEN) 2 g in sodium chloride 0.9 % 100 mL IVPB        2 g 300 mL/hr over 20 Minutes Intravenous Every 4 hours 04/29/20 1206     04/29/20 1300  cefTRIAXone (ROCEPHIN) 2 g in sodium chloride 0.9 % 100 mL IVPB        2 g 200 mL/hr over 30 Minutes Intravenous Every 12 hours 04/29/20 1206     04/29/20 0600  ampicillin (OMNIPEN) 2 g in sodium chloride 0.9 % 100 mL IVPB  Status:  Discontinued        2 g 300 mL/hr over 20 Minutes Intravenous Every 6 hours 04/29/20 0013 04/29/20 1206   04/28/20 2215  cefTRIAXone (ROCEPHIN) 1 g in sodium chloride 0.9 % 100 mL IVPB  Status:  Discontinued        1 g 200 mL/hr over 30 Minutes Intravenous  Once 04/28/20 2201 04/28/20 2211   04/28/20 2215  ampicillin (OMNIPEN) 2 g in sodium chloride 0.9 % 100 mL IVPB        2 g 300 mL/hr over 20 Minutes Intravenous  Once 04/28/20 2201 04/29/20 0027   04/28/20 2215  cefTRIAXone (ROCEPHIN) 2 g in sodium chloride 0.9 % 100 mL IVPB        2 g 200 mL/hr over 30 Minutes Intravenous  Once 04/28/20 2211 04/28/20 2314       Time Spent in minutes  30   Lala Lund M.D on 05/01/2020 at 9:23 AM  To page go to [email or web address] - password [name]  [facility] -  Office  [phone]   See all Orders from today for further details    Objective:   Vitals:   04/30/20 2341  05/01/20 0334 05/01/20 0352 05/01/20 0838  BP: 140/63   (!) 133/57  Pulse:    72  Resp: 20   20  Temp: 97.9 F (36.6 C)  98 F (36.7 C) 97.9 F (36.6 C)  TempSrc: Oral  Oral Oral  SpO2: 95% 97%  98%  Weight:      Height:        Wt Readings from Last 3 Encounters:  04/28/20 (!) 145.2 kg  04/21/20 (!) 141.5 kg  04/19/20 (!) 145.2 kg     Intake/Output Summary (Last  24 hours) at 05/01/2020 0923 Last data filed at 05/01/2020 0620 Gross per 24 hour  Intake --  Output 1100 ml  Net -1100 ml     Physical Exam  Awake Alert, No new F.N deficits, Normal affect [place].AT,PERRAL Supple Neck,No JVD, No cervical lymphadenopathy appriciated.  Symmetrical Chest wall movement, Good air movement bilaterally, CTAB RRR,No Gallops, positive systolic murmur +ve B.Sounds, Abd Soft, No tenderness, No organomegaly appriciated, No rebound - guarding or rigidity. left foot transmetatarsal amputation with a small plantar surface chronic shallow ulcer without any signs of active infection overlying first metatarsal head. He has chronic 2+ edema in both lower extremities left more than right.    Data Review:    CBC Recent Labs  Lab 04/28/20 2118 04/29/20 0759 04/30/20 0202 05/01/20 0319  WBC 16.0* 15.0* 7.0 5.7  HGB 11.7* 10.4* 10.5* 9.9*  HCT 36.6* 33.6* 32.7* 30.7*  PLT 106* 99* 91* 98*  MCV 93.4 93.6 90.8 89.2  MCH 29.8 29.0 29.2 28.8  MCHC 32.0 31.0 32.1 32.2  RDW 14.7 14.7 14.7 14.6  LYMPHSABS 0.6*  --  1.3 1.3  MONOABS 1.4*  --  0.8 0.8  EOSABS 0.1  --  0.1 0.2  BASOSABS 0.0  --  0.0 0.0    Recent Labs  Lab 04/28/20 2118 04/28/20 2133 04/28/20 2338 04/29/20 0759 04/29/20 0801 04/29/20 0828 04/30/20 0202 05/01/20 0319  NA 140  --   --  141  --   --  140 140  K 4.6  --   --  4.0  --   --  4.0 3.7  CL 107  --   --  110  --   --  109 105  CO2 23  --   --  23  --   --  25 26  GLUCOSE 161*  --   --  125*  --   --  127* 121*  BUN 23  --   --  22  --   --  18 21  CREATININE  1.13  --   --  1.09  --   --  0.90 1.27*  CALCIUM 8.6*  --   --  8.3*  --   --  8.6* 8.5*  AST  --   --   --  26  --   --  35 28  ALT  --   --   --  21  --   --  23 23  ALKPHOS  --   --   --  48  --   --  48 44  BILITOT  --   --   --  0.8  --   --  0.7 0.5  ALBUMIN  --   --   --  2.7*  --   --  2.8* 2.8*  MG  --   --   --  1.4*  --   --  1.6* 1.6*  CRP  --   --   --   --   --   --  4.0* 1.9*  DDIMER  --   --   --  1.33*  --   --  0.93* 0.79*  PROCALCITON  --   --   --  0.77  --   --  0.55 0.43  LATICACIDVEN  --  1.8 2.0*  --  1.3  --   --   --   TSH  --   --   --   --   --  0.905  --   --   HGBA1C  --   --   --  6.1*  --   --   --   --   BNP  --   --   --  82.7  --   --  82.6 94.0    ------------------------------------------------------------------------------------------------------------------ No results for input(s): CHOL, HDL, LDLCALC, TRIG, CHOLHDL, LDLDIRECT in the last 72 hours.  Lab Results  Component Value Date   HGBA1C 6.1 (H) 04/29/2020   ------------------------------------------------------------------------------------------------------------------ Recent Labs    04/29/20 0828  TSH 0.905    Cardiac Enzymes No results for input(s): CKMB, TROPONINI, MYOGLOBIN in the last 168 hours.  Invalid input(s): CK ------------------------------------------------------------------------------------------------------------------    Component Value Date/Time   BNP 94.0 05/01/2020 0319   BNP 32.0 01/14/2014 1656    Micro Results Recent Results (from the past 240 hour(s))  Respiratory Panel by RT PCR (Flu A&B, Covid) - Nasopharyngeal Swab     Status: None   Collection Time: 04/28/20 10:43 PM   Specimen: Nasopharyngeal Swab  Result Value Ref Range Status   SARS Coronavirus 2 by RT PCR NEGATIVE NEGATIVE Final    Comment: (NOTE) SARS-CoV-2 target nucleic acids are NOT DETECTED.  The SARS-CoV-2 RNA is generally detectable in upper respiratoy specimens during the acute  phase of infection. The lowest concentration of SARS-CoV-2 viral copies this assay can detect is 131 copies/mL. A negative result does not preclude SARS-Cov-2 infection and should not be used as the sole basis for treatment or other patient management decisions. A negative result may occur with  improper specimen collection/handling, submission of specimen other than nasopharyngeal swab, presence of viral mutation(s) within the areas targeted by this assay, and inadequate number of viral copies (<131 copies/mL). A negative result must be combined with clinical observations, patient history, and epidemiological information. The expected result is Negative.  Fact Sheet for Patients:  PinkCheek.be  Fact Sheet for Healthcare Providers:  GravelBags.it  This test is no t yet approved or cleared by the [place] FDA and  has been authorized for detection and/or diagnosis of SARS-CoV-2 by FDA under an Emergency Use Authorization (EUA). This EUA will remain  in effect (meaning this test can be used) for the duration of the COVID-19 declaration under Section 564(b)(1) of the Act, 21 U.S.C. section 360bbb-3(b)(1), unless the authorization is terminated or revoked sooner.     Influenza A by PCR NEGATIVE NEGATIVE Final   Influenza B by PCR NEGATIVE NEGATIVE Final    Comment: (NOTE) The Xpert Xpress SARS-CoV-2/FLU/RSV assay is intended as an aid in  the diagnosis of influenza from Nasopharyngeal swab specimens and  should not be used as a sole basis for treatment. Nasal washings and  aspirates are unacceptable for Xpert Xpress SARS-CoV-2/FLU/RSV  testing.  Fact Sheet for Patients: PinkCheek.be  Fact Sheet for Healthcare Providers: GravelBags.it  This test is not yet approved or cleared by the [place] FDA and  has been authorized for detection and/or diagnosis of  SARS-CoV-2 by  FDA under an Emergency Use Authorization (EUA). This EUA will remain  in effect (meaning this test can be used) for the duration of the  Covid-19 declaration under Section 564(b)(1) of the Act, 21  U.S.C. section 360bbb-3(b)(1), unless the authorization is  terminated or revoked. Performed at [facility], [place] [address], [place], [place] [address]   Blood culture (routine x 2)     Status: None (Preliminary result)   Collection Time: 04/28/20 11:38 PM  Specimen: BLOOD RIGHT FOREARM  Result Value Ref Range Status   Specimen Description BLOOD RIGHT FOREARM  Final   Special Requests   Final    BOTTLES DRAWN AEROBIC AND ANAEROBIC Blood Culture results may not be optimal due to an inadequate volume of blood received in culture bottles   Culture   Final    NO GROWTH 1 DAY Performed at [facility], [place] [address], [place], [place] [address]    Report Status PENDING  Incomplete  Blood culture (routine x 2)     Status: None (Preliminary result)   Collection Time: 04/28/20 11:50 PM   Specimen: BLOOD LEFT HAND  Result Value Ref Range Status   Specimen Description BLOOD LEFT HAND  Final   Special Requests   Final    BOTTLES DRAWN AEROBIC AND ANAEROBIC Blood Culture results may not be optimal due to an inadequate volume of blood received in culture bottles   Culture   Final    NO GROWTH 1 DAY Performed at [facility], [place] [address], [place], [place] [address]    Report Status PENDING  Incomplete    Radiology Reports CT FOOT LEFT WO CONTRAST  Result Date: 04/29/2020 CLINICAL DATA:  Question of osteomyelitis EXAM: CT OF THE LEFT FOOT WITHOUT CONTRAST TECHNIQUE: Multidetector CT imaging of the left foot was performed according to the standard protocol. Multiplanar CT image reconstructions were also generated. COMPARISON:  None. FINDINGS: Bones/Joint/Cartilage The patient is status post transmetatarsal first through fifth digit amputation. There is no  definite areas of cortical destruction or periosteal reaction noted. The amputation sites appear to be grossly intact. 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 Calcaneal enthesophyte seen on the plantar surface. There is diffuse osteopenia. Ligaments Suboptimally assessed by CT. Muscles and Tendons There is diffuse fatty atrophy of the muscles surrounding the forefoot. The flexor extensor tendons appear to be grossly intact. The plantar fascia appears to be intact. Soft tissues Overlying the plantar the near the first metatarsal amputation site there is a focal area of superficial ulceration with diffuse skin thickening and subcutaneous edema. No definite loculated fluid collection or subcutaneous emphysema is seen. IMPRESSION: Superficial ulceration along the plantar base beneath the first metatarsal amputation site with cellulitis and postsurgical changes. No definite loculated fluid collection or subcutaneous emphysema. Status post transmetatarsal amputation of the first through fifth digits without definite evidence of osteomyelitis. Electronically Signed   By: Prudencio Pair M.D.   On: 04/29/2020 03:30   DG Chest Port 1 View  Result Date: 04/28/2020 CLINICAL DATA:  Chest pain, weakness and dizziness. EXAM: PORTABLE CHEST 1 VIEW COMPARISON:  01/20/2020 FINDINGS: Mild patient rotation. Multi lead left-sided pacemaker. Cardiomegaly slightly increased from prior exam. Unchanged mediastinal contours. Aortic atherosclerosis. Mild vascular engorgement. No pulmonary edema. No focal airspace disease. No large pleural effusion. No pneumothorax. The bones appear under mineralized. Spinal stimulator in place. Multiple overlying monitoring devices. No acute osseous abnormalities are seen. IMPRESSION: Cardiomegaly with mild vascular congestion. Electronically Signed   By: Keith Rake M.D.   On: 04/28/2020 21:40   ECHOCARDIOGRAM COMPLETE  Result Date: 04/29/2020    ECHOCARDIOGRAM REPORT    Patient Name:   DEYVI BONANNO Date of Exam: 04/29/2020 Medical Rec #:  [id number]       Height:       72.0 in Accession #:    [id number]      Weight:       320.0 lb Date of Birth:  [birth year]       BSA:          2.602 m Patient Age:    59 years        BP:           127/96 mmHg Patient Gender: M               HR:           66 bpm. Exam Location:  Inpatient Procedure: 2D Echo and Intracardiac Opacification Agent Indications:    endocarditis  History:        Patient has prior history of Echocardiogram examinations, most                 recent 01/16/2020. CAD, Defibrillator, COPD,                 Signs/Symptoms:Fever; Risk Factors:Sleep Apnea.  Sonographer:    Johny Chess Referring Phys: Graylin Shiver [facility]  Sonographer Comments: Suboptimal parasternal window and patient is morbidly obese. Image acquisition challenging due to patient body habitus. IMPRESSIONS  1. Left ventricular ejection fraction, by estimation, is 55 to 60%. The left ventricle has normal function. The left ventricle has no regional wall motion abnormalities. Left ventricular diastolic parameters are consistent with Grade I diastolic dysfunction (impaired relaxation).  2. Right ventricular systolic function is normal. The right ventricular size is mildly enlarged. Tricuspid regurgitation signal is inadequate for assessing PA pressure.  3. Left atrial size was mildly dilated.  4. The mitral valve is degenerative. No evidence of mitral valve regurgitation. Mild mitral stenosis. The mean mitral valve gradient is 5.0 mmHg. Moderate mitral annular calcification. There is a 1.3 x 1 cm mobile vegetation attached to the anterior mitral leaflet.  5. The aortic valve was not well visualized. Aortic valve regurgitation is not visualized. Mild aortic valve stenosis. Aortic valve mean gradient measures 16.0 mmHg.  6. The inferior vena cava is normal in size with greater than 50% respiratory variability, suggesting right atrial pressure of 3 mmHg.  7.  Technically difficult study with very poor windows. Possible mitral valve vegetation. The tricuspid valve, aortic valve, and ICD were poorly visualized. Suggest TEE. FINDINGS  Left Ventricle: Left ventricular ejection fraction, by estimation, is 55 to 60%. The left ventricle has normal function. The left ventricle has no regional wall motion abnormalities. Definity contrast agent was given IV to delineate the left ventricular  endocardial borders. The left ventricular internal cavity size was normal in size. There is no left ventricular hypertrophy. Left ventricular diastolic parameters are consistent with Grade I diastolic dysfunction (impaired relaxation). Right Ventricle: The right ventricular size is mildly enlarged. No increase in right ventricular wall thickness. Right ventricular systolic function is normal. Tricuspid regurgitation signal is inadequate for assessing PA pressure. Left Atrium: Left atrial size was mildly dilated. Right Atrium: Right atrial size was normal in size. Pericardium: There is no evidence of pericardial effusion. Mitral Valve: The mitral valve is degenerative in appearance. There is moderate thickening of the mitral valve leaflet(s). Moderate mitral annular calcification. No evidence of mitral valve regurgitation. Mild mitral valve stenosis. MV peak gradient, 10.1 mmHg. The mean mitral valve gradient is 5.0 mmHg. Tricuspid Valve: The tricuspid valve is not well visualized. Tricuspid valve regurgitation is not demonstrated. Aortic Valve: The aortic valve was not well visualized. Aortic valve regurgitation is not visualized. Mild aortic stenosis is present. Aortic valve mean gradient measures 16.0 mmHg. Aortic valve peak gradient measures 30.9 mmHg. Pulmonic Valve: The pulmonic valve  was not well visualized. Pulmonic valve regurgitation is not visualized. Aorta: Aortic root could not be assessed. Venous: The inferior vena cava is normal in size with greater than 50% respiratory  variability, suggesting right atrial pressure of 3 mmHg. IAS/Shunts: No atrial level shunt detected by color flow Doppler. Additional Comments: A pacer wire is visualized in the right ventricle.   Diastology LV e' medial:    6.74 cm/s LV E/e' medial:  18.4 LV e' lateral:   8.05 cm/s LV E/e' lateral: 15.4  RIGHT VENTRICLE             IVC RV S prime:     15.60 cm/s  IVC diam: 1.70 cm TAPSE (M-mode): 3.0 cm LEFT ATRIUM             Index LA Vol (A2C):   78.1 ml 30.02 ml/m LA Vol (A4C):   90.2 ml 34.67 ml/m LA Biplane Vol: 91.5 ml 35.17 ml/m  AORTIC VALVE AV Vmax:           278.00 cm/s AV Vmean:          182.000 cm/s AV VTI:            0.576 m AV Peak Grad:      30.9 mmHg AV Mean Grad:      16.0 mmHg LVOT Vmax:         119.00 cm/s LVOT Vmean:        81.800 cm/s LVOT VTI:          0.236 m LVOT/AV VTI ratio: 0.41 MITRAL VALVE MV Area (PHT): 2.05 cm     SHUNTS MV Peak grad:  10.1 mmHg    Systemic VTI: 0.24 m MV Mean grad:  5.0 mmHg MV Vmax:       1.59 m/s MV Vmean:      100.0 cm/s MV Decel Time: 370 msec MV E velocity: 124.00 cm/s MV A velocity: 132.00 cm/s MV E/A ratio:  0.94 Loralie Champagne MD Electronically signed by Loralie Champagne MD Signature Date/Time: 04/29/2020/6:03:22 PM    Final    [name] EKG SITE RITE  Result Date: 04/30/2020 If Site Rite image not attached, placement could not be confirmed due to current cardiac rhythm.

## 2020-05-01 NOTE — Progress Notes (Signed)
Patient complaint of sudden chest pain 10/10 radiating to left shoulder,V/S checked,EKG done,Nitroglycerin SL 0.80m given X 2 doses.Pain scale reduces to 4/10.Dr. CTonie Griffithmade aware with order made.Will continue to monitor.

## 2020-05-01 NOTE — Consult Note (Addendum)
Cardiology Consultation:   Patient ID: Tim [name]; [id number]; Aug 08, 1940   Admit date: 04/28/2020 Date of Consult: 05/01/2020  Primary Care Provider: Tammi Sou, MD Primary Cardiologist: Kirk Ruths, MD 12/28/2015 K. Felts, NP 01/30/2020 Primary Electrophysiologist:  None  Dr Deno Etienne at [name]   Patient Profile:   Tim [name] is a 79 y.o. 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  He was seen by Dr. Cyndia Bent 12/03/2019, from his note: 79 years old with multiple comorbidities including super morbid obesity, transmetatarsal amputation with debilitation and deconditioning, malnutrition, CAD and ischemic cardiomyopathy, pacemaker dependent rhythm, that would make surgical removal of the TAVR valve and aortic root replacement extremely high risk with a slim chance of functional recovery. I would recommend non-surgical treatment with prolonged antibiotics.   History of Present Illness:   Tim [name] had a TEE during his admission in June 2021. It showed vegetation on both his pacemaker lead and his TAVR valve.  He completed 6 weeks of IV antibiotics and was on oral suppressive therapy.  He has followed with ID during this time.  He was admitted 10/28 with fevers and chills.  Blood cultures are incomplete but negative so far.  However, an echocardiogram showed a new mobile density on his mitral valve.  A TEE is scheduled for Monday.  He had some chest  pain and cardiology is asked to evaluate him.  Tim [name] had an episode of chest pain earlier today.  He described it as sharp and stabbing.  It was not a pressure or an ache.  It has since resolved.  He does not know of anything that brought it on.  It reached a 7 or 8/10.  It made him catch his breath, but did not lead to shortness of breath.  He has never had pain like this before.  Of note, he can point to the area where the pain was in that area is still tender to palpation.  In general, he feels he does pretty well.  He has been working to increase his strength and treatment since his transmetatarsal amputation.  He was ambulating at first with a walker, then 2 canes and is now down to 1 cane.  He ambulated with physical therapy yesterday and was able to walk 100 feet.  He was also able to climb 4 stairs such as he will have to do to be at home.  He did not get any chest discomfort with this exertion.  He does not feel he got particularly short of breath.  He does not have any history of chest pain or shortness of breath with exertion, although he cannot do as much as he used to.  The major symptom for his heart attack was a strange feeling which he says he has not had in years.  He has chronic lower extremity edema, and has compression boots similar to [employer] on.  He denies orthopnea or PND.  No palpitations, no presyncope or syncope.  He has a long history of lower extremity edema and nonhealing foot wounds leading to surgery.   Past Medical History:  Diagnosis Date  . AICD (automatic cardioverter/defibrillator) present   . ASTHMA   . Asthma    as a child  . Balanitis    +severe phimosis+ buried penis->circ not possible but dorsal slit done 10/2019  . BPH (benign prostatic hypertrophy)    with urinary retention.  Renal u/s 12/04/19 NORMAL KIDNEYS, NORMAL BLADDER, NO HYDRONEPHROSIS  . CAD (coronary artery disease)    Nonobstructive by 12/2012 cath; then 03/2016 he required BMS to  RCA ([facility]).  In-stent restenosis on cath 11/01/16, baloon angioplasty successful--pt to take plavix (no ASA), + eliquis (for PAF).  [name] CATARACT, HX OF   . Chronic combined systolic and diastolic CHF (congestive heart failure) ([place]) 05/2016   Ischemic CM.  04/2018 EF 40-45%, grd I DD.  [name] Chronic pain syndrome    Lumbar DDD; chronic neuropathic pain (DM); has spinal stimulator and sees pain mgmt MD  . Complicated UTI (urinary tract infection) 09/2019   Phimoses, acute urinary retention, entoerococcus UTI, enterococc bacteremia.  F/u blood clx's neg x 5d.    [name] COPD   . Diabetes mellitus type 2 with complications (HCC)    ZYS0Y jumped from 5.7% to 6.1% 03/2015---started metformin at that time.  DM 2 dx by fasting gluc criteria 2018.  Has chronic neuropath pain  . Elevated transaminase level 02/2016   Suspect due to fatty liver (documented on u/s 2007). Plan repeat labs 03/2016.  [name] Enterococcal bacteremia 09/2019   Phimoses, acute urinary retention, entoerococcus UTI, enterococc bacteremia.  F/u blood clx's neg x 5d.    . Essential hypertension, benign   . Fatty liver 2007   2007 u/s showed fatty liver with hepatosplenomegaly.  2019 repeat u/s->fatty liver but no cirrhosis or hepatosplenomegaly.  . FUO (fever of unknown origin) 11/26/2019  . GERD (gastroesophageal reflux disease)   . Glaucoma   . GOUT   . Hallux valgus (acquired)   . HH (hiatus hernia)   . HYPERCHOLESTEROLEMIA-PURE   . Hypogonadism male   . ICD (implantable cardioverter-defibrillator) infection ([place]) 12/22/2019  . 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 Tommy Medal)  . Lumbosacral neuritis   . Lumbosacral spondylosis    Lumbar spinal stenosis with neurogenic claudication--contributes to his chronic pain syndrome  . Normal memory function 08/2014   Neuropsychological testing ([facility]): no cognitive impairment or sign of  neurodegenerative disorder.  Likely has adjustment d/o with mixed anxiety/depressed features and may benefit from low dose SNRI.    [name] Normocytic anemia 03/2016   Mild-pt needs ferritin and vit B12 level checked (as of 03/22/16). Hb stable 09/2019  . NSTEMI (non-ST elevated myocardial infarction) ([place]) 03/20/2016   BMS to RCA  . Obesity hypoventilation syndrome ([place])   . Obesity, unspecified   . Orthostatic hypotension   . OSA on CPAP    8 cm H2O  . OSTEOARTHRITIS   . Paroxysmal atrial fibrillation ([place]) 2003    (? chronic?) Off anticoag for a while due to falls.  Then apixaban started 12/2014.  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 Ella Bodo): neuropathic pain->diff to treat, failed nucynta, failed spinal stimul trial, oxycontin hs + tramadol + gabap as of 12/2017 f/u Dr. Letta Pate.  . Personal history of colonic adenoma 10/30/2012  Diminutive adenoma, consider repeat 2019 per GI  . PFO (patent foramen ovale) 09/2019   small, with predominately L to R shunt  . Pneumonia   . Presence of cardiac defibrillator 11/07/2017  . Primary osteoarthritis of both knees    Bone on bone of medial compartments, + signif patellofemoral arth bilat.--supartz inj series started 09/12/17  . 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 PUD (peptic ulcer disease)   . PULMONARY HYPERTENSION, HX OF   . Secondary male hypogonadism 2017  . Sepsis ([place]) 04/29/2020  . 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 Shortness of breath    with exertion: much improved s/p TAVR and treatment for CHF.  . Sick sinus syndrome (HCC)    PPM placed  . Thrombocytopenia ([place]) 2018   HSM on 2007 abd u/s---suspect some mild splenic sequestration chronically.  [name] Unspecified glaucoma(365.9)   . Unspecified hereditary and idiopathic peripheral neuropathy approx age 42   bilat LE's, ? left arm, too.  Feet became progressively  numb + left foot pain intermittently.  Pt may be trying a spinal stimulator (as of 05/2015)  . VENOUS INSUFFICIENCY    Being followed by Dr. Sharol Given as of 10/2016 for two R LL venous stasis ulcers/skin tears.  Healed as of 10/30/16 f/u with Dr. Sharol Given.  . VENTRAL HERNIA   . Wound dehiscence 10/22/2019    Past Surgical History:  Procedure Laterality Date  . AMPUTATION Left 04/11/2013   Procedure: AMPUTATION DIGIT Left 3rd toe;  Surgeon: [name], MD;  Location: [place];  Service: Orthopedics;  Laterality: Left;  Left 3rd toe amputation at [facility]  . AMPUTATION Left 08/29/2019   Procedure: LEFT TRANSMETATARSAL AMPUTATION;  Surgeon: [name], MD;  Location: [place];  Service: Orthopedics;  Laterality: Left;  . CARDIAC CATHETERIZATION  1997; 03/10/16   1997 Non-obstructive disease.  03/2016 BMS to RCA, with 25% pDiag dz, o/w normal cors per cath 03/07/16.  Cath 11/01/16: in stent restenosis, successful baloon angioplasty.  [name] CARDIAC CATHETERIZATION  12/24/2012   mild < 20% LCx, prox 30% RCA; LVEF 55-65% , moderate pulmonary HTN, moderate AS  . CARDIAC DEFIBRILLATOR PLACEMENT  11/07/2017   [facility] MRI Quad CRT defibrillator  . CARDIOVASCULAR STRESS TEST  05/11/16 ([facility])   Myocardial perfusion imaging:  No ischemia; scar in apex, global hypokinesis, EF 36%.  . Carotid dopplers  03/09/2016   [facility]: no hemodynamically significant stenosis on either side.  . CHOLECYSTECTOMY    . COLONOSCOPY    . COLONOSCOPY [name] 10/30/2012   Procedure: COLONOSCOPY;  Surgeon: [name], MD;  Location: [facility];  Service: Endoscopy;  Laterality: [name];  . CORONARY ANGIOPLASTY WITH STENT PLACEMENT  03/2016; 04/2017   2017-[facility]: BMS to RCA-pt was placed on Brilinta.  04/2017: DES to RCA.  . DORSAL SLIT [name] 10/29/2019   for severe phimosis. Procedure: DORSAL SLIT;  Surgeon: Alexis Frock, MD;  Location: [facility];  Service: Urology;  Laterality: [name];  2 MINS  . EYE SURGERY Bilateral cataract  . HEMORRHOID SURGERY    .  INTRAOCULAR LENS INSERTION Bilateral   . KNEE SURGERY Right   . LEFT AND RIGHT HEART CATHETERIZATION WITH CORONARY ANGIOGRAM [name] 12/24/2012   Procedure: LEFT AND RIGHT HEART CATHETERIZATION WITH CORONARY ANGIOGRAM;  Surgeon: [name], MD;  Location: [facility] CATH [facility];  Service: Cardiovascular;  Laterality: [name];  . LEG SURGERY Bilateral    lenghtening   .  PACEMAKER PLACEMENT  04/13/2016   2nd deg HB after TAVR, pt had DC MDT PPM placed.  [name] SHOULDER ARTHROSCOPY  08/30/2011   Procedure: ARTHROSCOPY SHOULDER;  Surgeon: [name], MD;  Location: [place];  Service: Orthopedics;  Laterality: Right;  Right Shoulder Arthroscopy, Debridement, and Decompression  . SPINAL CORD STIMULATOR INSERTION [name] 09/10/2015   Procedure: LUMBAR SPINAL CORD STIMULATOR INSERTION;  Surgeon: [name], MD;  Location: [place] [facility];  Service: Neurosurgery;  Laterality: [name];  . TEE WITHOUT CARDIOVERSION [name] 09/16/2019   Procedure: TRANSESOPHAGEAL ECHOCARDIOGRAM (TEE);  Surgeon: Skeet Latch, MD;  Location: [place];  Service: Cardiovascular;  Laterality: [name];  . TEE WITHOUT CARDIOVERSION [name] 12/02/2019   +vegetation on AVR and pacer lead in RV.  EF 55-60%, normal wall motion.  Valves function normal.  Procedure: TRANSESOPHAGEAL ECHOCARDIOGRAM (TEE);  Surgeon: Geralynn Rile, MD;  Location: [place];  Service: Cardiovascular;  Laterality: [name];  . 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  R big toe surg due to osteoarth  . TONSILLECTOMY    . traeculectomy Left    eye  . TRANSCATHETER AORTIC VALVE REPLACEMENT, TRANSFEMORAL  04/11/2016  . TRANSESOPHAGEAL ECHOCARDIOGRAM  03/09/2016; 09/2019   [facility]: EF 55-60%, PFO seen with bi-directional shunting, no thrombus in appendage.  09/2019 ->no valvular vegetations. Small patent foramen ovale with predominantly left to right shunting across the interatrial septum.  . TRANSTHORACIC ECHOCARDIOGRAM  01/2015; 01/2016; 05/18/16; 09/18/16, 05/2017, 08/2017   01/2015 No  signif change in aortic stenosis (moderate).  01/2016 Severe LVH w/small LV cavity, EF 60-65%, grade I diast dysfxn.  05/2016 (s/p TAVR): EF 50-55%, grd I DD, biopros AV good.  08/2016--EF 50-55%, LV septal motion c/w conduction abnl, grd I DD,mild MS,bioprosth aortic valve well seated, w/trace AR. 05/2017 TTE EF 35%. 08/2017-EF 35%, mod diff hypokin LV, grd I DD, biopros AV good.   . TRANSTHORACIC ECHOCARDIOGRAM  04/2018; 09/2019   04/2018: EF 40-45%, mod diffuse LV hypokinesis, grd I DD, bioprosth AV well seated, no AS or AR. 09/2019 EF 60-65%, grd I DD, valves fine, including bioprosth AV  . VITRECTOMY       Prior to Admission medications   Medication Sig Start Date End Date Taking? Authorizing Provider  albuterol (PROVENTIL HFA;VENTOLIN HFA) 108 (90 BASE) MCG/ACT inhaler Inhale 2 puffs into the lungs 4 (four) times daily as needed for wheezing or shortness of breath.   Yes [provider]  allopurinol (ZYLOPRIM) 300 MG tablet TAKE 1 TABLET BY MOUTH ONCE DAILY WITH FOOD 12/09/19  Yes McGowen, Adrian Blackwater, MD  amoxicillin (AMOXIL) 500 MG capsule Take 1 capsule (500 mg total) by mouth 3 (three) times daily. 12/22/19  Yes Tommy Medal, [name], MD  apixaban (ELIQUIS) 5 MG TABS tablet Take 5 mg by mouth 2 (two) times daily.   Yes [provider]  ASPIRIN LOW DOSE 81 MG EC tablet Take 81 mg by mouth daily. 02/03/19  Yes [provider]  B Complex-C (B-COMPLEX WITH VITAMIN C) tablet Take 1 tablet by mouth daily. Take 1 tablet daily, 251m   Yes [provider]  ezetimibe (ZETIA) 10 MG tablet Take 1 tablet (10 mg total) by mouth daily. 12/16/19  Yes McGowen, PAdrian Blackwater MD  finasteride (PROSCAR) 5 MG tablet Take 1 tablet (5 mg total) by mouth daily. 12/10/19  Yes McGowen, PAdrian Blackwater MD  fluticasone (FLONASE) 50 MCG/ACT nasal spray Place 2 sprays into both nostrils as needed for allergies or rhinitis.   Yes  [provider]  gabapentin (NEURONTIN) 800 MG tablet TAKE 1 TABLET (800  MG TOTAL) BY MOUTH 4 (FOUR) TIMES DAILY. 01/14/20  Yes Kirsteins, Luanna Salk, MD  KLOR-CON M20 20 MEQ tablet Take 20 mEq by mouth daily. 03/07/20  Yes [provider]  metoprolol succinate (TOPROL-XL) 25 MG 24 hr tablet Take 25 mg by mouth daily. 12/17/19  Yes [provider]  Multiple Vitamin (MULTIVITAMIN ADULT PO) Take by mouth daily.   Yes [provider]  niacin (NIASPAN) 1000 MG CR tablet TAKE 1 TABLET BY MOUTH TWICE A DAY 04/26/20  Yes McGowen, Adrian Blackwater, MD  nitroGLYCERIN (NITROSTAT) 0.4 MG SL tablet Place 0.4 mg under the tongue every 5 (five) minutes as needed for chest pain.  09/04/17  Yes [provider]  pantoprazole (PROTONIX) 40 MG tablet TAKE 1 TABLET BY MOUTH EVERY DAY 01/09/20  Yes McGowen, Adrian Blackwater, MD  rosuvastatin (CRESTOR) 40 MG tablet take 1 tablet by mouth once daily Patient taking differently: Take 40 mg by mouth daily.  02/05/17  Yes Lelon Perla, MD  sacubitril-valsartan (ENTRESTO) 24-26 MG Take 1 tablet by mouth 2 (two) times daily.  07/19/17  Yes [provider]  torsemide (DEMADEX) 20 MG tablet Take 20 mg by mouth 2 (two) times daily as needed (swelling).    Yes [provider]  vitamin C (ASCORBIC ACID) 250 MG tablet Take 250 mg by mouth daily.   Yes [provider]  VITAMIN D, CHOLECALCIFEROL, PO Take 1 tablet by mouth daily.    Yes [provider]  Grant Ruts INHUB 250-50 MCG/DOSE AEPB TAKE 1 PUFF BY MOUTH TWICE A DAY 02/17/20  Yes McGowen, Adrian Blackwater, MD    Inpatient Medications: Scheduled Meds: . allopurinol  100 mg Oral BID  . apixaban  5 mg Oral BID  . ezetimibe  10 mg Oral Daily  . finasteride  5 mg Oral Daily  . gabapentin  800 mg Oral QID  . insulin aspart  0-9 Units Subcutaneous TID WC  . isosorbide mononitrate  30 mg Oral Daily  . metoprolol succinate  25 mg Oral Daily  . mometasone-formoterol  2 puff Inhalation BID  .  morphine injection  2 mg Intravenous Once  . pantoprazole  40 mg Oral  Q1200  . rosuvastatin  40 mg Oral QHS   Continuous Infusions: . ampicillin (OMNIPEN) IV 2 g (05/01/20 1241)  . cefTRIAXone (ROCEPHIN)  IV 2 g (04/30/20 2224)   PRN Meds: albuterol, nitroGLYCERIN  Allergies:    Allergies  Allergen Reactions  . Brimonidine Tartrate Shortness Of Breath    Alphagan-Shortness of breath  . Brinzolamide Shortness Of Breath    AZOPT- Shortness of breath  . Latanoprost Shortness Of Breath    XALATAN- Shortness of breath  . Nucynta [Tapentadol] Shortness Of Breath  . Sulfa Antibiotics Palpitations  . Timolol Maleate Shortness Of Breath and Other (See Comments)    TIMOPTIC- Aggravated asthma  . Diltiazem Swelling     leg swelling  . Rofecoxib Swelling     VIOXX- leg swelling  . Vancomycin Hives and Other (See Comments)    Possible "Red Man Syndrome"? > hives/blisters  . Codeine Other (See Comments)    Childhood reaction  . Tamsulosin Other (See Comments)    Dizziness   . Celecoxib Other (See Comments)    CELLBREX-confusion  . Colchicine Diarrhea    diarrhea  . Tape Rash    Social History:   Social History   Socioeconomic History  .  Marital status: Married    Spouse name: Not on file  . Number of children: 0  . Years of education: 61  . Highest education level: Not on file  Occupational History  . Occupation: Engineer, production    Comment: retired  Tobacco Use  . Smoking status: Never Smoker  . Smokeless tobacco: Never Used  Vaping Use  . Vaping Use: Never used  Substance and Sexual Activity  . Alcohol use: No    Alcohol/week: 0.0 standard drinks  . Drug use: No    Types: Oxycodone  . Sexual activity: Not Currently  Other Topics Concern  . Not on file  Social History Narrative   HSG, [name] - BS, [name] - MS-engineering, 2 years on PhD - [employer] [place]. Married - '65 - 57yr/divorced; '76- 3 yrs/divorced; '92 . No children. 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 ACP/Living Will - Yes CPR;  long-term Mechanical ventilation as long as he was able to cognate; ok for long term artificial nutrition. Precondition being able to cognate and not to have too much pain.    Social Determinants of Health   Financial Resource Strain:   . Difficulty of Paying Living Expenses: Not on file  Food Insecurity:   . Worried About RCharity fundraiserin the Last Year: Not on file  . Ran Out of Food in the Last Year: Not on file  Transportation Needs:   . Lack of Transportation (Medical): Not on file  . Lack of Transportation (Non-Medical): Not on file  Physical Activity:   . Days of Exercise per Week: Not on file  . Minutes of Exercise per Session: Not on file  Stress:   . Feeling of Stress : Not on file  Social Connections:   . Frequency of Communication with Friends and Family: Not on file  . Frequency of Social Gatherings with Friends and Family: Not on file  . Attends Religious Services: Not on file  . Active Member of Clubs or Organizations: Not on file  . Attends CArchivistMeetings: Not on file  . Marital Status: Not on file  Intimate Partner Violence:   . Fear of Current or Ex-Partner: Not on file  . Emotionally Abused: Not on file  . Physically Abused: Not on file  . Sexually Abused: Not on file    Family History:   Family History  Problem Relation Age of Onset  . Hypertension Mother   . Coronary artery disease Mother   . Heart attack Mother   . Neuropathy Mother   . Pulmonary fibrosis Father        asbestosis   Family Status:  Family Status  Relation Name Status  . Mother  Deceased at age 80 . Father  Deceased at age 80   ROS:  Please see the history of present illness.  All other ROS reviewed and negative.     Physical Exam/Data:   Vitals:   04/30/20 2341 05/01/20 0334 05/01/20 0352 05/01/20 0838  BP: 140/63   (!) 133/57  Pulse:    72  Resp: 20   20  Temp: 97.9 F (36.6 C)  98 F (36.7 C) 97.9 F (36.6 C)  TempSrc: Oral  Oral Oral  SpO2:  95% 97%  98%  Weight:      Height:        Intake/Output Summary (Last 24 hours) at 05/01/2020 1340 Last data filed at 05/01/2020 0620 Gross  per 24 hour  Intake --  Output 1100 ml  Net -1100 ml    Last 3 Weights 04/28/2020 04/21/2020 04/19/2020  Weight (lbs) 320 lb 312 lb 320 lb  Weight (kg) 145.151 kg 141.522 kg 145.151 kg     Body mass index is 43.4 kg/m.   General:  Well nourished, well developed, male in no acute distress HEENT: normal Lymph: no adenopathy Neck: JVD -not seen elevated Endocrine:  No thryomegaly Vascular: No carotid bruits; upper extremity pulses 2+  Cardiac:  normal S1, S2; RRR; 2/6 murmur Lungs: Decreased breath sounds but essentially clear bilaterally, no wheezing, rhonchi  Abd: soft, nontender, no hepatomegaly  Ext: + edema on lower extremities, but Unna boots in place.  Unable to check distal pulses in lower extremities due to the wrappings, capillary refill is within normal limits on the right Musculoskeletal:  No new deformities, BUE and BLE strength normal and equal, s/p transmetatarsal amputation on the left Skin: warm and dry  Neuro:  CNs 2-12 intact, no focal abnormalities noted Psych:  Normal affect   EKG:  The EKG was personally reviewed and demonstrates: 10/27 ECG is sinus tachycardia, ventricular pacing, heart rate 101 Telemetry:  Telemetry was personally reviewed and demonstrates: Sinus rhythm, ventricular pacing, 4 beats of nonsustained VT   CV studies:   TEE: 12/02/2019 KEY FINDINGS: 1. Mobile vegetation attached to [place] of TAVR valve.  2. Mobile vegetation attached to ppm lead in the RA/RV.  3. Normal LVEF, 60%. 4. No significant regurgitation noted.  5. Full report to follow.  ECHO: 04/29/2020 1. Left ventricular ejection fraction, by estimation, is 55 to 60%. The  left ventricle has normal function. The left ventricle has no regional  wall motion abnormalities. Left ventricular diastolic parameters are   consistent with Grade I diastolic  dysfunction (impaired relaxation).  2. Right ventricular systolic function is normal. The right ventricular  size is mildly enlarged. Tricuspid regurgitation signal is inadequate for  assessing PA pressure.  3. Left atrial size was mildly dilated.  4. The mitral valve is degenerative. No evidence of mitral valve  regurgitation. Mild mitral stenosis. The mean mitral valve gradient is 5.0  mmHg. Moderate mitral annular calcification. There is a 1.3 x 1 cm mobile  vegetation attached to the anterior  mitral leaflet.  5. The aortic valve was not well visualized. Aortic valve regurgitation  is not visualized. Mild aortic valve stenosis. Aortic valve mean gradient  measures 16.0 mmHg.  6. The inferior vena cava is normal in size with greater than 50%  respiratory variability, suggesting right atrial pressure of 3 mmHg.  7. Technically difficult study with very poor windows. Possible mitral  valve vegetation. The tricuspid valve, aortic valve, and ICD were poorly  visualized. Suggest TEE.   CATH: 04/27/2017 FINDINGS:  Coronary Angiography  1. Left Main - Normal  2. Left anterior descending artery - 25% mid  3. Diagonals - 50% proximal  4. Left Circumflex - Normal  5. Obtuse Marginals - Normal  6. Right Coronary Artery - 99% proximal in-stent restenosis, 50% mid, 50% distal  7. Posterior Descending Artery - Normal  Additional comments on angiography: Right Dominance   Hemodynamics  1. Aortic Pressure - 105/60 mmHg   Percutaneous coronary intervention: An AL 1 guide catheter sat well in the RCA. The 99% lesion was crossed with a long BMW wire. I predilated with a 2.5 x 12 mm balloon as well as a 3.0 x 10 mm [place]ine. I implanted a  3.0 x 12 mm Synergy drug-eluting  stent inside the previously placed bare-metal stent. I postdilated this with a 3.25 mm noncompliant balloon. Subsequent angiography demonstrated an excellent result with lesion  reduction from 99% to 0% with no complicating features. The procedure was  performed using aspirin, Brilinta, and Angiomax.   CONCLUSIONS:  1. Successful transradial cardiac catheterization  2. Obstructive one-vessel coronary artery disease involving the RCA with in-stent restenosis of the bare-metal stent, status post drug-eluting stent implantation with lesion reduction from 99% to 0%   RECOMMENDATIONS: The patient will need to remain on plavix and eliquis at discharge.    MYOVIEW: 07/09/2019 FINDINGS:  There is normal perfusion to the left ventricle without fixed or reversible perfusion defects on rest or stress images.   No wall motion abnormalities.   Calculated ejection fraction is 48%.   IMPRESSION:  No evidence of inducible ischemia.     Laboratory Data:   Chemistry Recent Labs  Lab 04/29/20 0759 04/30/20 0202 05/01/20 0319  NA 141 140 140  K 4.0 4.0 3.7  CL 110 109 105  CO2 23 25 26   GLUCOSE 125* 127* 121*  BUN 22 18 21   CREATININE 1.09 0.90 1.27*  CALCIUM 8.3* 8.6* 8.5*  GFRNONAA >60 >60 57*  ANIONGAP 8 6 9     Lab Results  Component Value Date   ALT 23 05/01/2020   AST 28 05/01/2020   ALKPHOS 44 05/01/2020   BILITOT 0.5 05/01/2020   Hematology Recent Labs  Lab 04/29/20 0759 04/30/20 0202 05/01/20 0319  WBC 15.0* 7.0 5.7  RBC 3.59* 3.60* 3.44*  HGB 10.4* 10.5* 9.9*  HCT 33.6* 32.7* 30.7*  MCV 93.6 90.8 89.2  MCH 29.0 29.2 28.8  MCHC 31.0 32.1 32.2  RDW 14.7 14.7 14.6  PLT 99* 91* 98*   Cardiac Enzymes High Sensitivity Troponin:   Recent Labs  Lab 04/28/20 2118 04/28/20 2336 04/29/20 0013 04/29/20 0759  TROPONINIHS 11 16 15 15       BNP Recent Labs  Lab 04/29/20 0759 04/30/20 0202 05/01/20 0319  BNP 82.7 82.6 94.0    DDimer  Recent Labs  Lab 04/29/20 0759 04/30/20 0202 05/01/20 0319  DDIMER 1.33* 0.93* 0.79*   TSH:  Lab Results  Component Value Date   TSH 0.905 04/29/2020   Lipids: Lab Results  Component Value  Date   CHOL 107 12/25/2018   HDL 62.70 12/25/2018   LDLCALC 29 12/25/2018   TRIG 72.0 12/25/2018   CHOLHDL 2 12/25/2018   HgbA1c: Lab Results  Component Value Date   HGBA1C 6.1 (H) 04/29/2020   Magnesium:  Magnesium  Date Value Ref Range Status  05/01/2020 1.6 (L) 1.7 - 2.4 mg/dL Final    Comment:    Performed at [facility], [place] [address] [place], [place] [address]     Radiology/Studies:  CT FOOT LEFT WO CONTRAST  Result Date: 04/29/2020 CLINICAL DATA:  Question of osteomyelitis EXAM: CT OF THE LEFT FOOT WITHOUT CONTRAST TECHNIQUE: Multidetector CT imaging of the left foot was performed according to the standard protocol. Multiplanar CT image reconstructions were also generated. COMPARISON:  None. FINDINGS: Bones/Joint/Cartilage The patient is status post transmetatarsal first through fifth digit amputation. There is no definite areas of cortical destruction or periosteal reaction noted. The amputation sites appear to be grossly intact. 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 Calcaneal enthesophyte seen on the plantar surface. There is diffuse osteopenia. Ligaments Suboptimally assessed by CT. Muscles and Tendons There is diffuse fatty  atrophy of the muscles surrounding the forefoot. The flexor extensor tendons appear to be grossly intact. The plantar fascia appears to be intact. Soft tissues Overlying the plantar the near the first metatarsal amputation site there is a focal area of superficial ulceration with diffuse skin thickening and subcutaneous edema. No definite loculated fluid collection or subcutaneous emphysema is seen. IMPRESSION: Superficial ulceration along the plantar base beneath the first metatarsal amputation site with cellulitis and postsurgical changes. No definite loculated fluid collection or subcutaneous emphysema. Status post transmetatarsal amputation of the first through fifth digits without definite evidence of osteomyelitis.  Electronically Signed   By: Prudencio Pair M.D.   On: 04/29/2020 03:30   DG Chest Port 1 View  Result Date: 04/28/2020 CLINICAL DATA:  Chest pain, weakness and dizziness. EXAM: PORTABLE CHEST 1 VIEW COMPARISON:  01/20/2020 FINDINGS: Mild patient rotation. Multi lead left-sided pacemaker. Cardiomegaly slightly increased from prior exam. Unchanged mediastinal contours. Aortic atherosclerosis. Mild vascular engorgement. No pulmonary edema. No focal airspace disease. No large pleural effusion. No pneumothorax. The bones appear under mineralized. Spinal stimulator in place. Multiple overlying monitoring devices. No acute osseous abnormalities are seen. IMPRESSION: Cardiomegaly with mild vascular congestion. Electronically Signed   By: Keith Rake M.D.   On: 04/28/2020 21:40   ECHOCARDIOGRAM COMPLETE  Result Date: 04/29/2020    ECHOCARDIOGRAM REPORT   Patient Name:   Tim [name] Date of Exam: 04/29/2020 Medical Rec #:  [id number]       Height:       72.0 in Accession #:    [id number]      Weight:       320.0 lb Date of Birth:  16-Oct-1940       BSA:          2.602 m Patient Age:    49 years        BP:           127/96 mmHg Patient Gender: M               HR:           66 bpm. Exam Location:  Inpatient Procedure: 2D Echo and Intracardiac Opacification Agent Indications:    endocarditis  History:        Patient has prior history of Echocardiogram examinations, most                 recent 01/16/2020. CAD, Defibrillator, COPD,                 Signs/Symptoms:Fever; Risk Factors:Sleep Apnea.  Sonographer:    Johny Chess Referring Phys: Graylin Shiver [facility]  Sonographer Comments: Suboptimal parasternal window and patient is morbidly obese. Image acquisition challenging due to patient body habitus. IMPRESSIONS  1. Left ventricular ejection fraction, by estimation, is 55 to 60%. The left ventricle has normal function. The left ventricle has no regional wall motion abnormalities. Left ventricular diastolic  parameters are consistent with Grade I diastolic dysfunction (impaired relaxation).  2. Right ventricular systolic function is normal. The right ventricular size is mildly enlarged. Tricuspid regurgitation signal is inadequate for assessing PA pressure.  3. Left atrial size was mildly dilated.  4. The mitral valve is degenerative. No evidence of mitral valve regurgitation. Mild mitral stenosis. The mean mitral valve gradient is 5.0 mmHg. Moderate mitral annular calcification. There is a 1.3 x 1 cm mobile vegetation attached to the anterior mitral leaflet.  5. The aortic valve was not well visualized. Aortic valve regurgitation is not  visualized. Mild aortic valve stenosis. Aortic valve mean gradient measures 16.0 mmHg.  6. The inferior vena cava is normal in size with greater than 50% respiratory variability, suggesting right atrial pressure of 3 mmHg.  7. Technically difficult study with very poor windows. Possible mitral valve vegetation. The tricuspid valve, aortic valve, and ICD were poorly visualized. Suggest TEE. FINDINGS  Left Ventricle: Left ventricular ejection fraction, by estimation, is 55 to 60%. The left ventricle has normal function. The left ventricle has no regional wall motion abnormalities. Definity contrast agent was given IV to delineate the left ventricular  endocardial borders. The left ventricular internal cavity size was normal in size. There is no left ventricular hypertrophy. Left ventricular diastolic parameters are consistent with Grade I diastolic dysfunction (impaired relaxation). Right Ventricle: The right ventricular size is mildly enlarged. No increase in right ventricular wall thickness. Right ventricular systolic function is normal. Tricuspid regurgitation signal is inadequate for assessing PA pressure. Left Atrium: Left atrial size was mildly dilated. Right Atrium: Right atrial size was normal in size. Pericardium: There is no evidence of pericardial effusion. Mitral Valve: The  mitral valve is degenerative in appearance. There is moderate thickening of the mitral valve leaflet(s). Moderate mitral annular calcification. No evidence of mitral valve regurgitation. Mild mitral valve stenosis. MV peak gradient, 10.1 mmHg. The mean mitral valve gradient is 5.0 mmHg. Tricuspid Valve: The tricuspid valve is not well visualized. Tricuspid valve regurgitation is not demonstrated. Aortic Valve: The aortic valve was not well visualized. Aortic valve regurgitation is not visualized. Mild aortic stenosis is present. Aortic valve mean gradient measures 16.0 mmHg. Aortic valve peak gradient measures 30.9 mmHg. Pulmonic Valve: The pulmonic valve was not well visualized. Pulmonic valve regurgitation is not visualized. Aorta: Aortic root could not be assessed. Venous: The inferior vena cava is normal in size with greater than 50% respiratory variability, suggesting right atrial pressure of 3 mmHg. IAS/Shunts: No atrial level shunt detected by color flow Doppler. Additional Comments: A pacer wire is visualized in the right ventricle.   Diastology LV e' medial:    6.74 cm/s LV E/e' medial:  18.4 LV e' lateral:   8.05 cm/s LV E/e' lateral: 15.4  RIGHT VENTRICLE             IVC RV S prime:     15.60 cm/s  IVC diam: 1.70 cm TAPSE (M-mode): 3.0 cm LEFT ATRIUM             Index LA Vol (A2C):   78.1 ml 30.02 ml/m LA Vol (A4C):   90.2 ml 34.67 ml/m LA Biplane Vol: 91.5 ml 35.17 ml/m  AORTIC VALVE AV Vmax:           278.00 cm/s AV Vmean:          182.000 cm/s AV VTI:            0.576 m AV Peak Grad:      30.9 mmHg AV Mean Grad:      16.0 mmHg LVOT Vmax:         119.00 cm/s LVOT Vmean:        81.800 cm/s LVOT VTI:          0.236 m LVOT/AV VTI ratio: 0.41 MITRAL VALVE MV Area (PHT): 2.05 cm     SHUNTS MV Peak grad:  10.1 mmHg    Systemic VTI: 0.24 m MV Mean grad:  5.0 mmHg MV Vmax:       1.59 m/s MV Vmean:  100.0 cm/s MV Decel Time: 370 msec MV E velocity: 124.00 cm/s MV A velocity: 132.00 cm/s MV E/A ratio:   0.94 Loralie Champagne MD Electronically signed by Loralie Champagne MD Signature Date/Time: 04/29/2020/6:03:22 PM    Final    [name] EKG SITE RITE  Result Date: 04/30/2020 If Site Rite image not attached, placement could not be confirmed due to current cardiac rhythm.   Assessment and Plan:   1.  Chest pain: -Symptoms are atypical and reproducible with chest wall palpation -However, he had an episode overnight which was relieved after sublingual nitroglycerin. -Initial troponins were negative on admission. -EF was normal on echo with no wall motion abnormalities -Recheck troponins and consider further evaluation if significantly elevated -If he has no symptoms with exertion, and troponins remain negative, do not think further evaluation is indicated (assuming his EF is unchanged and no wall motion abnormalities on TEE) -Can try Voltaren gel for chest wall pain. -Continue to monitor for symptoms  2.  History of RV lead and aortic valve vegetations with new possible vegetation on mitral valve -He has been scheduled for TEE on Monday at 8:30 AM, orders written  3.  Hypomagnesemia -He has gotten regular IV magnesium supplement since admission -His magnesium was 1.4 on admission and is now up to 1.6 after IV supplements -With ventricular ectopy, would like to keep his magnesium level at 2 -We will start Mag-Ox 400 mg daily for now, would go down to 200 mg daily at discharge  Otherwise, per IM Principal Problem:   Sepsis ([place]) Active Problems:   COPD (chronic obstructive pulmonary disease) (HCC)   CAD (coronary artery disease), native coronary artery   S/P TAVR (transcatheter aortic valve replacement)   ICD (implantable cardioverter-defibrillator) infection (HCC)   Fever   Infective endocarditis     For questions or updates, please contact [facility] Please consult [email or web address] for contact info under Cardiology/STEMI.   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   Hospital course complicated by episode of severe, left sided, sharp chest pain overnight that was improved with SL-NTG. Had another episode when rolling over in the bed this morning. Trop pending due to difficulty with IV access. Notably, he was able to work with PT yesterday without exertional symptoms and his chest wall is tender to palpation in the region where his chest pain was located last night. Unclear cause of pain, however, lower suspicion of cardiac etiology given sharp/stabbing nature of symptoms and worsening with chest palpation. Will follow-up troponin and determine if further cardiac work-up needed at this time.  Exam: GEN: Comfortable, sitting up in bed Neck: No JVD Cardiac: RRR, 2/6 systolic murmur best heard at RUSB Respiratory: Clear to auscultation bilaterally. GI: Obese, soft, nontender, non-distended  MS: Bilateral LE wrapped, +edema. Neuro:  Nonfocal  Psych: Normal affect   Plan: -Plan for TEE Monday to assess MV vegetation; will likely need prolonged course of ABX given concern for recurrent IE -Per prior notes, patient was deemed not a candidate for surgical AVR or ICD lead extraction for IE as too high risk. Managing with ABX therapy.  Follow-up culture data -Follow-up troponin and if normal, no further ischemic studies needed at this time given the sharp/stabbing nature of the pain, worsening with palpation of the chest wall, and lack of symptoms when working with PT -If troponin positive or significant change in EF or WMA on TEE, can consider further ischemic work-up at that time vs optimization of medical therapy given patient's significant comorbidities and concern for active infection that makes him high risk for invasive strategy -Continue metop, imdur and nitro as needed -Continue rosuvastatin 14m daily -Continue apixaban for pAfib -Resume home entresto once Cr improved pending adequate BP room -Agree with PICC line placement as patient has difficult IV access  HGwyndolyn Kaufman MD

## 2020-05-01 NOTE — Progress Notes (Signed)
At bedside to place PICC.  Pt on bedside commode.

## 2020-05-01 NOTE — Progress Notes (Signed)
Spoke with Kerrie Pleasure re PICC placement today for LT ABT.  States is for a TEE today as well.  Will attempt to coordinate care

## 2020-05-02 DIAGNOSIS — I2510 Atherosclerotic heart disease of native coronary artery without angina pectoris: Secondary | ICD-10-CM

## 2020-05-02 DIAGNOSIS — R079 Chest pain, unspecified: Secondary | ICD-10-CM | POA: Diagnosis not present

## 2020-05-02 LAB — CBC WITH DIFFERENTIAL/PLATELET
Abs Immature Granulocytes: 0.01 10*3/uL (ref 0.00–0.07)
Basophils Absolute: 0 10*3/uL (ref 0.0–0.1)
Basophils Relative: 1 %
Eosinophils Absolute: 0.2 10*3/uL (ref 0.0–0.5)
Eosinophils Relative: 4 %
HCT: 28.9 % — ABNORMAL LOW (ref 39.0–52.0)
Hemoglobin: 9.3 g/dL — ABNORMAL LOW (ref 13.0–17.0)
Immature Granulocytes: 0 %
Lymphocytes Relative: 23 %
Lymphs Abs: 1.3 10*3/uL (ref 0.7–4.0)
MCH: 29.1 pg (ref 26.0–34.0)
MCHC: 32.2 g/dL (ref 30.0–36.0)
MCV: 90.3 fL (ref 80.0–100.0)
Monocytes Absolute: 0.7 10*3/uL (ref 0.1–1.0)
Monocytes Relative: 12 %
Neutro Abs: 3.4 10*3/uL (ref 1.7–7.7)
Neutrophils Relative %: 60 %
Platelets: 104 10*3/uL — ABNORMAL LOW (ref 150–400)
RBC: 3.2 MIL/uL — ABNORMAL LOW (ref 4.22–5.81)
RDW: 14.5 % (ref 11.5–15.5)
WBC: 5.6 10*3/uL (ref 4.0–10.5)
nRBC: 0 % (ref 0.0–0.2)

## 2020-05-02 LAB — COMPREHENSIVE METABOLIC PANEL
ALT: 21 U/L (ref 0–44)
AST: 25 U/L (ref 15–41)
Albumin: 2.8 g/dL — ABNORMAL LOW (ref 3.5–5.0)
Alkaline Phosphatase: 43 U/L (ref 38–126)
Anion gap: 10 (ref 5–15)
BUN: 18 mg/dL (ref 8–23)
CO2: 29 mmol/L (ref 22–32)
Calcium: 8.5 mg/dL — ABNORMAL LOW (ref 8.9–10.3)
Chloride: 103 mmol/L (ref 98–111)
Creatinine, Ser: 1.15 mg/dL (ref 0.61–1.24)
GFR, Estimated: >60 mL/min (ref >60)
Glucose, Bld: 123 mg/dL — ABNORMAL HIGH (ref 70–99)
Potassium: 3.5 mmol/L (ref 3.5–5.1)
Sodium: 142 mmol/L (ref 135–145)
Total Bilirubin: 0.7 mg/dL (ref 0.3–1.2)
Total Protein: 5.6 g/dL — ABNORMAL LOW (ref 6.5–8.1)

## 2020-05-02 LAB — BRAIN NATRIURETIC PEPTIDE: B Natriuretic Peptide: 70.3 pg/mL (ref 0.0–100.0)

## 2020-05-02 LAB — GLUCOSE, CAPILLARY
Glucose-Capillary: 120 mg/dL — ABNORMAL HIGH (ref 70–99)
Glucose-Capillary: 118 mg/dL — ABNORMAL HIGH (ref 70–99)
Glucose-Capillary: 114 mg/dL — ABNORMAL HIGH (ref 70–99)
Glucose-Capillary: 117 mg/dL — ABNORMAL HIGH (ref 70–99)
Glucose-Capillary: 135 mg/dL — ABNORMAL HIGH (ref 70–99)
Glucose-Capillary: 130 mg/dL — ABNORMAL HIGH (ref 70–99)

## 2020-05-02 LAB — MAGNESIUM: Magnesium: 1.8 mg/dL (ref 1.7–2.4)

## 2020-05-02 LAB — C-REACTIVE PROTEIN: CRP: 1 mg/dL — ABNORMAL HIGH (ref <1.0)

## 2020-05-02 LAB — D-DIMER, QUANTITATIVE: D-Dimer, Quant: 0.67 ug/mL-FEU — ABNORMAL HIGH (ref 0.00–0.50)

## 2020-05-02 LAB — PROCALCITONIN: Procalcitonin: 0.24 ng/mL

## 2020-05-02 MED ORDER — SODIUM CHLORIDE 0.9% FLUSH: 10.0000 mL | INTRAVENOUS | Status: DC | PRN

## 2020-05-02 MED ORDER — MAGNESIUM SULFATE 2 GM/50ML IV SOLN
2.0000 g | Freq: Once | INTRAVENOUS | Status: AC
  Administered 2020-05-02: 2 g via INTRAVENOUS
  Filled 2020-05-02: qty 50

## 2020-05-02 MED ORDER — SODIUM CHLORIDE 0.9 % IV SOLN: INTRAVENOUS | Status: DC

## 2020-05-02 MED ORDER — SODIUM CHLORIDE 0.9% FLUSH
10.0000 mL | Freq: Two times a day (BID) | INTRAVENOUS | Status: DC
  Administered 2020-05-02 – 2020-05-06 (×5): 10 mL

## 2020-05-02 MED ORDER — CHLORHEXIDINE GLUCONATE CLOTH 2 % EX PADS
6.0000 | MEDICATED_PAD | Freq: Every day | CUTANEOUS | Status: DC
  Administered 2020-05-02 – 2020-05-07 (×6): 6 via TOPICAL

## 2020-05-02 MED ORDER — SACUBITRIL-VALSARTAN 24-26 MG PO TABS
1.0000 | ORAL_TABLET | Freq: Two times a day (BID) | ORAL | Status: DC
  Administered 2020-05-02 – 2020-05-07 (×9): 1 via ORAL
  Filled 2020-05-02 (×10): qty 1

## 2020-05-02 MED ORDER — POTASSIUM CHLORIDE CRYS ER 20 MEQ PO TBCR
40.0000 meq | EXTENDED_RELEASE_TABLET | Freq: Once | ORAL | Status: AC
  Administered 2020-05-02: 40 meq via ORAL
  Filled 2020-05-02: qty 2

## 2020-05-02 NOTE — Progress Notes (Signed)
Pt has home CPAP and states he does not need any assistance with setting up.

## 2020-05-02 NOTE — Progress Notes (Signed)
Peripherally Inserted Central Catheter Placement  The IV Nurse has discussed with the patient and/or persons authorized to consent for the patient, the purpose of this procedure and the potential benefits and risks involved with this procedure.  The benefits include less needle sticks, lab draws from the catheter, and the patient may be discharged home with the catheter. Risks include, but not limited to, infection, bleeding, blood clot (thrombus formation), and puncture of an artery; nerve damage and irregular heartbeat and possibility to perform a PICC exchange if needed/ordered by physician.  Alternatives to this procedure were also discussed.  B[employer] Power PICC patient education guide, fact sheet on infection prevention and patient information card has been provided to patient /or left at bedside.    PICC Placement Documentation  PICC Single Lumen 05/02/20 PICC Right Brachial 49 cm 1 cm (Active)  Indication for Insertion or Continuance of Line Prolonged intravenous therapies;Home intravenous therapies (PICC only) 05/02/20 0743  Exposed Catheter (cm) 1 cm 05/02/20 0743  Site Assessment Clean;Dry;Intact 05/02/20 0743  Line Status Flushed;Saline locked;Blood return noted 05/02/20 0743  Dressing Type Transparent 05/02/20 0743  Dressing Status Clean;Dry;Intact 05/02/20 0743  Antimicrobial disc in place? Yes 05/02/20 0743  Safety Lock Not Applicable 22/48/25 0037  Line Care Connections checked and tightened 05/02/20 0743  Line Adjustment (NICU/IV Team Only) No 05/02/20 0743  Dressing Intervention New dressing 05/02/20 0743  Dressing Change Due 05/09/20 05/02/20 0743       Rolena Infante 05/02/2020, 7:44 AM

## 2020-05-02 NOTE — Progress Notes (Signed)
Progress Note  Patient Name: Tim [name] Date of Encounter: 05/02/2020  [facility] Cardiologist: Kirk Ruths, MD   Subjective  Patient states that he feels better this morning. No recurrence of chest pain. Had PICC placed yesterday for IV access.  Trop 8, BNP 70.   Inpatient Medications    Scheduled Meds: . allopurinol  100 mg Oral BID  . apixaban  5 mg Oral BID  . Chlorhexidine Gluconate Cloth  6 each Topical Daily  . ezetimibe  10 mg Oral Daily  . finasteride  5 mg Oral Daily  . gabapentin  800 mg Oral QID  . insulin aspart  0-9 Units Subcutaneous TID WC  . isosorbide mononitrate  30 mg Oral Daily  . magnesium oxide  400 mg Oral Daily  . metoprolol succinate  25 mg Oral Daily  . mometasone-formoterol  2 puff Inhalation BID  .  morphine injection  2 mg Intravenous Once  . pantoprazole  40 mg Oral Q1200  . potassium chloride  40 mEq Oral Once  . rosuvastatin  40 mg Oral QHS  . sodium chloride flush  10-40 mL Intracatheter Q12H   Continuous Infusions: . ampicillin (OMNIPEN) IV 2 g (05/02/20 0424)  . cefTRIAXone (ROCEPHIN)  IV 2 g (05/01/20 2118)  . magnesium sulfate bolus IVPB     PRN Meds: albuterol, nitroGLYCERIN, sodium chloride flush   Vital Signs    Vitals:   05/02/20 0418 05/02/20 0433 05/02/20 0808 05/02/20 0815  BP: (!) 137/55   (!) 122/55  Pulse: 68   70  Resp: 20   17  Temp: 97.8 F (36.6 C)   97.6 F (36.4 C)  TempSrc: Oral   Axillary  SpO2: 97%  96% 98%  Weight:  (!) 147.1 kg    Height:        Intake/Output Summary (Last 24 hours) at 05/02/2020 0818 Last data filed at 05/02/2020 0424 Gross per 24 hour  Intake 760 ml  Output 1400 ml  Net -640 ml   Last 3 Weights 05/02/2020 04/28/2020 04/21/2020  Weight (lbs) 324 lb 4.8 oz 320 lb 312 lb  Weight (kg) 147.102 kg 145.151 kg 141.522 kg      Telemetry    V-paced - Personally Reviewed  ECG    No new ECG - Personally Reviewed  Physical Exam   GEN: Comfortable, sitting up  in bed.   Neck: No JVD Cardiac: RR, 2/6 systolic murmur best heard at RUSB.  Respiratory: Clear to auscultation bilaterally. GI: Obese, soft nontender, non-distended  MS: Bilateral LE are wrapped; 2+ edema, warm. Neuro:  Nonfocal  Psych: Normal affect   Labs    High Sensitivity Troponin:   Recent Labs  Lab 04/28/20 2118 04/28/20 2336 04/29/20 0013 04/29/20 0759 05/01/20 1748  TROPONINIHS 11 16 15 15 8       Chemistry Recent Labs  Lab 04/30/20 0202 05/01/20 0319 05/02/20 0241  NA 140 140 142  K 4.0 3.7 3.5  CL 109 105 103  CO2 25 26 29   GLUCOSE 127* 121* 123*  BUN 18 21 18   CREATININE 0.90 1.27* 1.15  CALCIUM 8.6* 8.5* 8.5*  PROT 5.5* 5.6* 5.6*  ALBUMIN 2.8* 2.8* 2.8*  AST 35 28 25  ALT 23 23 21   ALKPHOS 48 44 43  BILITOT 0.7 0.5 0.7  GFRNONAA >60 57* >60  ANIONGAP 6 9 10      Hematology Recent Labs  Lab 04/30/20 0202 05/01/20 0319 05/02/20 0241  WBC 7.0 5.7 5.6  RBC  3.60* 3.44* 3.20*  HGB 10.5* 9.9* 9.3*  HCT 32.7* 30.7* 28.9*  MCV 90.8 89.2 90.3  MCH 29.2 28.8 29.1  MCHC 32.1 32.2 32.2  RDW 14.7 14.6 14.5  PLT 91* 98* 104*    BNP Recent Labs  Lab 04/30/20 0202 05/01/20 0319 05/02/20 0241  BNP 82.6 94.0 70.3     DDimer  Recent Labs  Lab 04/30/20 0202 05/01/20 0319 05/02/20 0241  DDIMER 0.93* 0.79* 0.67*     Radiology    No results found.  Cardiac Studies   TEE: 12/02/2019 KEY FINDINGS: 1. Mobile vegetation attached to [place] in [place] of TAVR valve.  2. Mobile vegetation attached to ppm lead in the RA/RV.  3. Normal LVEF, 60%. 4. No significant regurgitation noted. 5. Full report to follow.  ECHO: 04/29/2020 1. Left ventricular ejection fraction, by estimation, is 55 to 60%. The  left ventricle has normal function. The left ventricle has no regional  wall motion abnormalities. Left ventricular diastolic parameters are  consistent with Grade I diastolic  dysfunction (impaired relaxation).  2. Right ventricular  systolic function is normal. The right ventricular  size is mildly enlarged. Tricuspid regurgitation signal is inadequate for  assessing PA pressure.  3. Left atrial size was mildly dilated.  4. The mitral valve is degenerative. No evidence of mitral valve  regurgitation. Mild mitral stenosis. The mean mitral valve gradient is 5.0  mmHg. Moderate mitral annular calcification. There is a 1.3 x 1 cm mobile  vegetation attached to the anterior  mitral leaflet.  5. The aortic valve was not well visualized. Aortic valve regurgitation  is not visualized. Mild aortic valve stenosis. Aortic valve mean gradient  measures 16.0 mmHg.  6. The inferior vena cava is normal in size with greater than 50%  respiratory variability, suggesting right atrial pressure of 3 mmHg.  7. Technically difficult study with very poor windows. Possible mitral  valve vegetation. The tricuspid valve, aortic valve, and ICD were poorly  visualized. Suggest TEE.   CATH: 04/27/2017 FINDINGS:  Coronary Angiography  1. Left Main - Normal  2. Left anterior descending artery - 25% mid  3. Diagonals - 50% proximal  4. Left Circumflex - Normal  5. Obtuse Marginals - Normal  6. Right Coronary Artery - 99% proximal in-stent restenosis, 50% mid, 50% distal  7. Posterior Descending Artery - Normal  Additional comments on angiography: Right Dominance   Hemodynamics  1. Aortic Pressure - 105/60 mmHg   Percutaneous coronary intervention: An AL 1 guide catheter sat well in the RCA. The 99% lesion was crossed with a long BMW wire. I predilated with a 2.5 x 12 mm balloon as well as a 3.0 x 10 mm [place]. I implanted a 3.0 x 12 mm Synergy drug-eluting  stent inside the previously placed bare-metal stent. I postdilated this with a 3.25 mm noncompliant balloon. Subsequent angiography demonstrated an excellent result with lesion reduction from 99% to 0% with no complicating features. The procedure was  performed using aspirin,  Brilinta, and Angiomax.   CONCLUSIONS:  1. Successful transradial cardiac catheterization  2. Obstructive one-vessel coronary artery disease involving the RCA with in-stent restenosis of the bare-metal stent, status post drug-eluting stent implantation with lesion reduction from 99% to 0%   RECOMMENDATIONS: The patient will need to remain on plavix and eliquis at discharge.    MYOVIEW: 07/09/2019 FINDINGS:  There is normal perfusion to the left ventricle without fixed or reversible perfusion defects on rest or stress images.   No  wall motion abnormalities.   Calculated ejection fraction is 48%.   IMPRESSION:  No evidence of inducible ischemia.   Patient Profile     79 y.o. 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   Assessment & Plan    #Non-cardiac chest pain: Patient with episode of severe, left sided chest pain while in bed resting on 10/29. Received nitro at that time and states it improved. Had another episode when rolling in the bed. Notably, he was able to work with PT without exertional symptoms. Trop negative and BNP normal. Exam notable for chest wall tenderness with similar to pain he experience overnight. Low suspicion for cardiac etiology of acute chest pain. 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 Was deemed too high risk for SAVR/lead replacement given comorbities. Was on prolonged course of ABX and cleared his cultures, however, returned with recurrent fevers and leukocytosis and new vegetation on MV concerning for recurrence. -TEE Monday -Continue ABX per ID -Patient deemed too high risk for surgical intervention/lead extraction on prior admission  #CAD s/pBMSto RCA in 2017, PTCA for ISR to RCA 2018, and PCI/DES to RCA in 2018 #Ischemic CM with recovered EF -Continue metop, crestor as above -Not on ASA due to need for [facility] -Resume home entresto -Does not appear grossly overloaded with normal BNP -Monitor I/Os and daily weights -Low Na diet  #pAFib: Remains in sinus currently. -Continue apixaban for [name] -Continue metop as above    For questions or updates, please contact [facility] Please consult [email or web address] for contact info under        Signed, Freada Bergeron, MD  05/02/2020, 8:18 AM

## 2020-05-02 NOTE — Progress Notes (Addendum)
PROGRESS NOTE                                                                                                                                                                                                             Patient Demographics:    Tim [name], is a 79 y.o. male, DOB - 1940-07-29, ZMC:[id number]  Outpatient Primary MD for the patient is McGowen, Adrian Blackwater, MD    LOS - 3  Admit date - 04/28/2020    Chief Complaint  Patient presents with  . Weakness       Brief Narrative (HPI from H&P)   Tim [name] is a 79 y.o. 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   Subjective:   Patient in bed, appears comfortable, denies any headache, no fever, no chest pain or pressure, no shortness of breath , no abdominal pain. No focal weakness.    Assessment  & Plan :    1. 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 On chronic amoxicillin treatment under the care of Dr. Drucilla Schmidt ID. Now 1.3 x 1 cm mobile vegetation attached to the anterior mitral leaflet on TTE.  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  Although present blood cultures which are still pending, continue empiric IV Rocephin 2 g and ampicillin.  Thereafter per ID.  Also consulted cardiology, cardiology on board and patient is due for TEE. PICC line placed 05/01/2020.    2. Paroxysmal A. fib. [place] vas score 2 score of greater than 3. Continue Eliquis, resume beta-blocker as blood pressure has improved .  3. Ischemic cardiomyopathy with chronic systolic heart failure EF around 40%. He has chronic 2+ leg edema, pressure is improved low-dose beta-blocker, diuretics as tolerated by blood pressure and renal function, he had a brief episode of chest pain on 05/01/2020 with unchanged EKG, added Imdur, undergoing TEE soon, holding Entresto for now due to AKI and soft blood pressures. Cardiology on board due to few episodes of chest pain.  4. Anemia of chronic disease. Stable follow.  5. COPD. Stable no wheezing.  6. BPH. Resume alpha-blocker.  7.  Hypomagnesemia.  Replaced.    8.  AKI.  Hold diuretics and Entresto on 05/01/2020 function is improved will resume on 11/1/202.  9. Thrombocytopenia. Likely due to infection monitor.  Lab Results  Component Value Date   PLT 104 (L) 05/02/2020    8. Prediabetes. Monitor. Add ISS as he stress-induced hyperglycemia.  Lab Results  Component Value Date   HGBA1C 6.1 (H) 04/29/2020   CBG (last 3)  Recent Labs    05/01/20 1752 05/01/20 2202 05/02/20 0624  GLUCAP 118* 120* 114*   Lab Results  Component Value Date   TSH 0.905 04/29/2020      Condition - Extremely Guarded  Family Communication  :  Wife bedside 04/29/20, called [phone] 04/30/2020 at 10:30 AM and left message  Code Status : DNR  Consults  :  ID  Procedures  :    PICC line placed 05/01/2020.   CT L Foot - no abscess  TTE - 1. Left ventricular ejection fraction, by estimation, is 55 to 60%. The left ventricle has normal function. The left ventricle has no regional wall  motion abnormalities. Left ventricular diastolic parameters are consistent with Grade I diastolic dysfunction (impaired relaxation).  2. Right ventricular systolic function is normal. The right ventricular size is mildly enlarged. Tricuspid regurgitation signal is inadequate for assessing PA pressure.  3. Left atrial size was mildly dilated.  4. The mitral valve is degenerative. No evidence of mitral valve regurgitation. Mild mitral stenosis. The mean mitral valve gradient is 5.0 mmHg. Moderate mitral annular calcification. There is a 1.3 x 1 cm mobile vegetation attached to the anterior mitral leaflet.  5. The aortic valve was not well visualized. Aortic valve regurgitation is not visualized. Mild aortic valve stenosis. Aortic valve mean gradient measures 16.0 mmHg.  6. The inferior vena cava is normal in size with greater than 50% respiratory variability, suggesting right atrial pressure of 3 mmHg.  7. Technically difficult study with very poor windows. Possible mitral valve vegetation. The tricuspid valve, aortic valve, and ICD were poorly visualized  PUD Prophylaxis : PPI  Disposition Plan  :    Status is: Inpatient  Remains inpatient appropriate because:IV treatments appropriate due to intensity of illness or inability to take PO   Dispo: The patient is from: Home              Anticipated d/c is to: Home              Anticipated d/c date is: > 3 days              Patient currently is not medically stable to d/c.  DVT Prophylaxis  : Eliquis  Lab Results  Component Value Date   PLT 104 (L) 05/02/2020    Diet :  Diet Order            Diet Heart Room service appropriate? Yes; Fluid consistency: Thin  Diet effective now  Inpatient Medications  Scheduled Meds: . allopurinol  100 mg Oral BID  . apixaban  5 mg Oral BID  . Chlorhexidine Gluconate Cloth  6 each Topical Daily  . ezetimibe  10 mg Oral Daily  . finasteride  5 mg Oral Daily  . gabapentin  800 mg Oral  QID  . insulin aspart  0-9 Units Subcutaneous TID WC  . isosorbide mononitrate  30 mg Oral Daily  . magnesium oxide  400 mg Oral Daily  . metoprolol succinate  25 mg Oral Daily  . mometasone-formoterol  2 puff Inhalation BID  .  morphine injection  2 mg Intravenous Once  . pantoprazole  40 mg Oral Q1200  . rosuvastatin  40 mg Oral QHS  . sacubitril-valsartan  1 tablet Oral BID  . sodium chloride flush  10-40 mL Intracatheter Q12H   Continuous Infusions: . ampicillin (OMNIPEN) IV 2 g (05/02/20 0914)  . cefTRIAXone (ROCEPHIN)  IV 2 g (05/01/20 2118)  . magnesium sulfate bolus IVPB     PRN Meds:.albuterol, nitroGLYCERIN, sodium chloride flush  Antibiotics  :    Anti-infectives (From admission, onward)   Start     Dose/Rate Route Frequency Ordered Stop   04/29/20 2200  cefTRIAXone (ROCEPHIN) 2 g in sodium chloride 0.9 % 100 mL IVPB  Status:  Discontinued        2 g 200 mL/hr over 30 Minutes Intravenous Every 24 hours 04/29/20 0013 04/29/20 1206   04/29/20 1600  ampicillin (OMNIPEN) 2 g in sodium chloride 0.9 % 100 mL IVPB        2 g 300 mL/hr over 20 Minutes Intravenous Every 4 hours 04/29/20 1206     04/29/20 1300  cefTRIAXone (ROCEPHIN) 2 g in sodium chloride 0.9 % 100 mL IVPB        2 g 200 mL/hr over 30 Minutes Intravenous Every 12 hours 04/29/20 1206     04/29/20 0600  ampicillin (OMNIPEN) 2 g in sodium chloride 0.9 % 100 mL IVPB  Status:  Discontinued        2 g 300 mL/hr over 20 Minutes Intravenous Every 6 hours 04/29/20 0013 04/29/20 1206   04/28/20 2215  cefTRIAXone (ROCEPHIN) 1 g in sodium chloride 0.9 % 100 mL IVPB  Status:  Discontinued        1 g 200 mL/hr over 30 Minutes Intravenous  Once 04/28/20 2201 04/28/20 2211   04/28/20 2215  ampicillin (OMNIPEN) 2 g in sodium chloride 0.9 % 100 mL IVPB        2 g 300 mL/hr over 20 Minutes Intravenous  Once 04/28/20 2201 04/29/20 0027   04/28/20 2215  cefTRIAXone (ROCEPHIN) 2 g in sodium chloride 0.9 % 100 mL IVPB        2  g 200 mL/hr over 30 Minutes Intravenous  Once 04/28/20 2211 04/28/20 2314       Time Spent in minutes  30   Lala Lund M.D on 05/02/2020 at 9:50 AM  To page go to [email or web address] - password [facility]  [facility] -  Office  [phone]   See all Orders from today for further details    Objective:   Vitals:   05/02/20 0433 05/02/20 0808 05/02/20 0815 05/02/20 0918  BP:   (!) 122/55   Pulse:   70 76  Resp:   17   Temp:   97.6 F (36.4 C)   TempSrc:   Axillary   SpO2:  96% 98%   Weight: (!) 147.1 kg  Height:        Wt Readings from Last 3 Encounters:  05/02/20 (!) 147.1 kg  04/21/20 (!) 141.5 kg  04/19/20 (!) 145.2 kg     Intake/Output Summary (Last 24 hours) at 05/02/2020 0950 Last data filed at 05/02/2020 0424 Gross per 24 hour  Intake 760 ml  Output 1400 ml  Net -640 ml     Physical Exam  Awake Alert, No new F.N deficits, Normal affect [place].AT,PERRAL Supple Neck,No JVD, No cervical lymphadenopathy appriciated.  Symmetrical Chest wall movement, Good air movement bilaterally, CTAB RRR,No Gallops, positive systolic murmur +ve B.Sounds, Abd Soft, No tenderness, No organomegaly appriciated, No rebound - guarding or rigidity. left foot transmetatarsal amputation with a small plantar surface chronic shallow ulcer without any signs of active infection overlying first metatarsal head. He has chronic 2+ edema in both lower extremities left more than right.    Data Review:    CBC Recent Labs  Lab 04/28/20 2118 04/29/20 0759 04/30/20 0202 05/01/20 0319 05/02/20 0241  WBC 16.0* 15.0* 7.0 5.7 5.6  HGB 11.7* 10.4* 10.5* 9.9* 9.3*  HCT 36.6* 33.6* 32.7* 30.7* 28.9*  PLT 106* 99* 91* 98* 104*  MCV 93.4 93.6 90.8 89.2 90.3  MCH 29.8 29.0 29.2 28.8 29.1  MCHC 32.0 31.0 32.1 32.2 32.2  RDW 14.7 14.7 14.7 14.6 14.5  LYMPHSABS 0.6*  --  1.3 1.3 1.3  MONOABS 1.4*  --  0.8 0.8 0.7  EOSABS 0.1  --  0.1 0.2 0.2  BASOSABS 0.0  --  0.0 0.0 0.0    Recent  Labs  Lab 04/28/20 2118 04/28/20 2133 04/28/20 2338 04/29/20 0759 04/29/20 0801 04/29/20 0828 04/30/20 0202 05/01/20 0319 05/02/20 0241  NA 140  --   --  141  --   --  140 140 142  K 4.6  --   --  4.0  --   --  4.0 3.7 3.5  CL 107  --   --  110  --   --  109 105 103  CO2 23  --   --  23  --   --  25 26 29   GLUCOSE 161*  --   --  125*  --   --  127* 121* 123*  BUN 23  --   --  22  --   --  18 21 18   CREATININE 1.13  --   --  1.09  --   --  0.90 1.27* 1.15  CALCIUM 8.6*  --   --  8.3*  --   --  8.6* 8.5* 8.5*  AST  --   --   --  26  --   --  35 28 25  ALT  --   --   --  21  --   --  23 23 21   ALKPHOS  --   --   --  48  --   --  48 44 43  BILITOT  --   --   --  0.8  --   --  0.7 0.5 0.7  ALBUMIN  --   --   --  2.7*  --   --  2.8* 2.8* 2.8*  MG  --   --   --  1.4*  --   --  1.6* 1.6* 1.8  CRP  --   --   --   --   --   --  4.0* 1.9* 1.0*  DDIMER  --   --   --  1.33*  --   --  0.93* 0.79* 0.67*  PROCALCITON  --   --   --  0.77  --   --  0.55 0.43 0.24  LATICACIDVEN  --  1.8 2.0*  --  1.3  --   --   --   --   TSH  --   --   --   --   --  0.905  --   --   --   HGBA1C  --   --   --  6.1*  --   --   --   --   --   BNP  --   --   --  82.7  --   --  82.6 94.0 70.3    ------------------------------------------------------------------------------------------------------------------ No results for input(s): CHOL, HDL, LDLCALC, TRIG, CHOLHDL, LDLDIRECT in the last 72 hours.  Lab Results  Component Value Date   HGBA1C 6.1 (H) 04/29/2020   ------------------------------------------------------------------------------------------------------------------ No results for input(s): TSH, T4TOTAL, T3FREE, THYROIDAB in the last 72 hours.  Invalid input(s): FREET3  Cardiac Enzymes No results for input(s): CKMB, TROPONINI, MYOGLOBIN in the last 168 hours.  Invalid input(s): CK ------------------------------------------------------------------------------------------------------------------      Component Value Date/Time   BNP 70.3 05/02/2020 0241   BNP 32.0 01/14/2014 1656    Micro Results Recent Results (from the past 240 hour(s))  Respiratory Panel by RT PCR (Flu A&B, Covid) - Nasopharyngeal Swab     Status: None   Collection Time: 04/28/20 10:43 PM   Specimen: Nasopharyngeal Swab  Result Value Ref Range Status   SARS Coronavirus 2 by RT PCR NEGATIVE NEGATIVE Final    Comment: (NOTE) SARS-CoV-2 target nucleic acids are NOT DETECTED.  The SARS-CoV-2 RNA is generally detectable in upper respiratoy specimens during the acute phase of infection. The lowest concentration of SARS-CoV-2 viral copies this assay can detect is 131 copies/mL. A negative result does not preclude SARS-Cov-2 infection and should not be used as the sole basis for treatment or other patient management decisions. A negative result may occur with  improper specimen collection/handling, submission of specimen other than nasopharyngeal swab, presence of viral mutation(s) within the areas targeted by this assay, and inadequate number of viral copies (<131 copies/mL). A negative result must be combined with clinical observations, patient history, and epidemiological information. The expected result is Negative.  Fact Sheet for Patients:  PinkCheek.be  Fact Sheet for Healthcare Providers:  GravelBags.it  This test is no t yet approved or cleared by the [place] FDA and  has been authorized for detection and/or diagnosis of SARS-CoV-2 by FDA under an Emergency Use Authorization (EUA). This EUA will remain  in effect (meaning this test can be used) for the duration of the COVID-19 declaration under Section 564(b)(1) of the Act, 21 U.S.C. section 360bbb-3(b)(1), unless the authorization is terminated or revoked sooner.     Influenza A by PCR NEGATIVE NEGATIVE Final   Influenza B by PCR NEGATIVE NEGATIVE Final    Comment: (NOTE) The Xpert  Xpress SARS-CoV-2/FLU/RSV assay is intended as an aid in  the diagnosis of influenza from Nasopharyngeal swab specimens and  should not be used as a sole basis for treatment. Nasal washings and  aspirates are unacceptable for Xpert Xpress SARS-CoV-2/FLU/RSV  testing.  Fact Sheet for Patients: PinkCheek.be  Fact Sheet for Healthcare Providers: GravelBags.it  This test is not yet approved or cleared by the [place] FDA and  has been authorized for detection and/or diagnosis of SARS-CoV-2 by  FDA under an Emergency Use Authorization (EUA). This EUA will remain  in effect (meaning this test can be used) for the duration of the  Covid-19 declaration under Section 564(b)(1) of the Act, 21  U.S.C. section 360bbb-3(b)(1), unless the authorization is  terminated or revoked. Performed at [facility], [place] [address] [place], [place] [address]   Blood culture (routine x 2)     Status: None (Preliminary result)   Collection Time: 04/28/20 11:38 PM   Specimen: BLOOD RIGHT FOREARM  Result Value Ref Range Status   Specimen Description BLOOD RIGHT FOREARM  Final   Special Requests   Final    BOTTLES DRAWN AEROBIC AND ANAEROBIC Blood Culture results may not be optimal due to an inadequate volume of blood received in culture bottles   Culture   Final    NO GROWTH 2 DAYS Performed at [facility], [place] [address], [place], [place] [address]    Report Status PENDING  Incomplete  Blood culture (routine x 2)     Status: None (Preliminary result)   Collection Time: 04/28/20 11:50 PM   Specimen: BLOOD LEFT HAND  Result Value Ref Range Status   Specimen Description BLOOD LEFT HAND  Final   Special Requests   Final    BOTTLES DRAWN AEROBIC AND ANAEROBIC Blood Culture results may not be optimal due to an inadequate volume of blood received in culture bottles   Culture   Final    NO GROWTH 2 DAYS Performed at [facility], [place] [address] [place],  [address]    Report Status PENDING  Incomplete    Radiology Reports CT FOOT LEFT WO CONTRAST  Result Date: 04/29/2020 CLINICAL DATA:  Question of osteomyelitis EXAM: CT OF THE LEFT FOOT WITHOUT CONTRAST TECHNIQUE: Multidetector CT imaging of the left foot was performed according to the standard protocol. Multiplanar CT image reconstructions were also generated. COMPARISON:  None. FINDINGS: Bones/Joint/Cartilage The patient is status post transmetatarsal first through fifth digit amputation. There is no definite areas of cortical destruction or periosteal reaction noted. The amputation sites appear to be grossly intact. 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 Calcaneal enthesophyte seen on the plantar surface. There is diffuse osteopenia. Ligaments Suboptimally assessed by CT. Muscles and Tendons There is diffuse fatty atrophy of the muscles surrounding the forefoot. The flexor extensor tendons appear to be grossly intact. The plantar fascia appears to be intact. Soft tissues Overlying the plantar the near the first metatarsal amputation site there is a focal area of superficial ulceration with diffuse skin thickening and subcutaneous edema. No definite loculated fluid collection or subcutaneous emphysema is seen. IMPRESSION: Superficial ulceration along the plantar base beneath the first metatarsal amputation site with cellulitis and postsurgical changes. No definite loculated fluid collection or subcutaneous emphysema. Status post transmetatarsal amputation of the first through fifth digits without definite evidence of osteomyelitis. Electronically Signed   By: Prudencio Pair M.D.   On: 04/29/2020 03:30   DG Chest Port 1 View  Result Date: 04/28/2020 CLINICAL DATA:  Chest pain, weakness and dizziness. EXAM: PORTABLE CHEST 1 VIEW COMPARISON:  01/20/2020 FINDINGS: Mild patient rotation. Multi lead left-sided pacemaker. Cardiomegaly slightly  increased from prior exam. Unchanged mediastinal contours. Aortic atherosclerosis. Mild vascular engorgement. No pulmonary edema. No focal airspace disease. No large pleural effusion. No pneumothorax. The bones appear under mineralized. Spinal stimulator in place. Multiple overlying monitoring devices. No acute osseous abnormalities are seen. IMPRESSION: Cardiomegaly with mild vascular  congestion. Electronically Signed   By: Keith Rake M.D.   On: 04/28/2020 21:40   ECHOCARDIOGRAM COMPLETE  Result Date: 04/29/2020    ECHOCARDIOGRAM REPORT   Patient Name:   Tim [name] Date of Exam: 04/29/2020 Medical Rec #:  [id number]       Height:       72.0 in Accession #:    [id number]      Weight:       320.0 lb Date of Birth:  1940-09-30       BSA:          2.602 m Patient Age:    42 years        BP:           127/96 mmHg Patient Gender: M               HR:           66 bpm. Exam Location:  Inpatient Procedure: 2D Echo and Intracardiac Opacification Agent Indications:    endocarditis  History:        Patient has prior history of Echocardiogram examinations, most                 recent 01/16/2020. CAD, Defibrillator, COPD,                 Signs/Symptoms:Fever; Risk Factors:Sleep Apnea.  Sonographer:    Johny Chess Referring Phys: Graylin Shiver [facility]  Sonographer Comments: Suboptimal parasternal window and patient is morbidly obese. Image acquisition challenging due to patient body habitus. IMPRESSIONS  1. Left ventricular ejection fraction, by estimation, is 55 to 60%. The left ventricle has normal function. The left ventricle has no regional wall motion abnormalities. Left ventricular diastolic parameters are consistent with Grade I diastolic dysfunction (impaired relaxation).  2. Right ventricular systolic function is normal. The right ventricular size is mildly enlarged. Tricuspid regurgitation signal is inadequate for assessing PA pressure.  3. Left atrial size was mildly dilated.  4. The mitral valve is  degenerative. No evidence of mitral valve regurgitation. Mild mitral stenosis. The mean mitral valve gradient is 5.0 mmHg. Moderate mitral annular calcification. There is a 1.3 x 1 cm mobile vegetation attached to the anterior mitral leaflet.  5. The aortic valve was not well visualized. Aortic valve regurgitation is not visualized. Mild aortic valve stenosis. Aortic valve mean gradient measures 16.0 mmHg.  6. The inferior vena cava is normal in size with greater than 50% respiratory variability, suggesting right atrial pressure of 3 mmHg.  7. Technically difficult study with very poor windows. Possible mitral valve vegetation. The tricuspid valve, aortic valve, and ICD were poorly visualized. Suggest TEE. FINDINGS  Left Ventricle: Left ventricular ejection fraction, by estimation, is 55 to 60%. The left ventricle has normal function. The left ventricle has no regional wall motion abnormalities. Definity contrast agent was given IV to delineate the left ventricular  endocardial borders. The left ventricular internal cavity size was normal in size. There is no left ventricular hypertrophy. Left ventricular diastolic parameters are consistent with Grade I diastolic dysfunction (impaired relaxation). Right Ventricle: The right ventricular size is mildly enlarged. No increase in right ventricular wall thickness. Right ventricular systolic function is normal. Tricuspid regurgitation signal is inadequate for assessing PA pressure. Left Atrium: Left atrial size was mildly dilated. Right Atrium: Right atrial size was normal in size. Pericardium: There is no evidence of pericardial effusion. Mitral Valve: The mitral valve is degenerative in appearance. There is moderate thickening of  the mitral valve leaflet(s). Moderate mitral annular calcification. No evidence of mitral valve regurgitation. Mild mitral valve stenosis. MV peak gradient, 10.1 mmHg. The mean mitral valve gradient is 5.0 mmHg. Tricuspid Valve: The tricuspid  valve is not well visualized. Tricuspid valve regurgitation is not demonstrated. Aortic Valve: The aortic valve was not well visualized. Aortic valve regurgitation is not visualized. Mild aortic stenosis is present. Aortic valve mean gradient measures 16.0 mmHg. Aortic valve peak gradient measures 30.9 mmHg. Pulmonic Valve: The pulmonic valve was not well visualized. Pulmonic valve regurgitation is not visualized. Aorta: Aortic root could not be assessed. Venous: The inferior vena cava is normal in size with greater than 50% respiratory variability, suggesting right atrial pressure of 3 mmHg. IAS/Shunts: No atrial level shunt detected by color flow Doppler. Additional Comments: A pacer wire is visualized in the right ventricle.   Diastology LV e' medial:    6.74 cm/s LV E/e' medial:  18.4 LV e' lateral:   8.05 cm/s LV E/e' lateral: 15.4  RIGHT VENTRICLE             IVC RV S prime:     15.60 cm/s  IVC diam: 1.70 cm TAPSE (M-mode): 3.0 cm LEFT ATRIUM             Index LA Vol (A2C):   78.1 ml 30.02 ml/m LA Vol (A4C):   90.2 ml 34.67 ml/m LA Biplane Vol: 91.5 ml 35.17 ml/m  AORTIC VALVE AV Vmax:           278.00 cm/s AV Vmean:          182.000 cm/s AV VTI:            0.576 m AV Peak Grad:      30.9 mmHg AV Mean Grad:      16.0 mmHg LVOT Vmax:         119.00 cm/s LVOT Vmean:        81.800 cm/s LVOT VTI:          0.236 m LVOT/AV VTI ratio: 0.41 MITRAL VALVE MV Area (PHT): 2.05 cm     SHUNTS MV Peak grad:  10.1 mmHg    Systemic VTI: 0.24 m MV Mean grad:  5.0 mmHg MV Vmax:       1.59 m/s MV Vmean:      100.0 cm/s MV Decel Time: 370 msec MV E velocity: 124.00 cm/s MV A velocity: 132.00 cm/s MV E/A ratio:  0.94 Loralie Champagne MD Electronically signed by Loralie Champagne MD Signature Date/Time: 04/29/2020/6:03:22 PM    Final    [name] EKG SITE RITE  Result Date: 04/30/2020 If Site Rite image not attached, placement could not be confirmed due to current cardiac rhythm.

## 2020-05-03 ENCOUNTER — Encounter (HOSPITAL_COMMUNITY)
Admission: EM | Disposition: A | Discharge status: Home Health | Payer: Self-pay | Source: Home / Self Care | Attending: Internal Medicine

## 2020-05-03 ENCOUNTER — Inpatient Hospital Stay (HOSPITAL_COMMUNITY): Payer: Medicare Other | Admitting: Registered Nurse

## 2020-05-03 ENCOUNTER — Inpatient Hospital Stay (HOSPITAL_COMMUNITY): Payer: Medicare Other

## 2020-05-03 DIAGNOSIS — Z952 Presence of prosthetic heart valve: Secondary | ICD-10-CM | POA: Diagnosis not present

## 2020-05-03 DIAGNOSIS — I39 Endocarditis and heart valve disorders in diseases classified elsewhere: Secondary | ICD-10-CM | POA: Diagnosis not present

## 2020-05-03 DIAGNOSIS — I2510 Atherosclerotic heart disease of native coronary artery without angina pectoris: Secondary | ICD-10-CM | POA: Diagnosis not present

## 2020-05-03 DIAGNOSIS — R7881 Bacteremia: Secondary | ICD-10-CM | POA: Diagnosis not present

## 2020-05-03 LAB — COMPREHENSIVE METABOLIC PANEL
ALT: 25 U/L (ref 0–44)
AST: 30 U/L (ref 15–41)
Albumin: 3.1 g/dL — ABNORMAL LOW (ref 3.5–5.0)
Alkaline Phosphatase: 52 U/L (ref 38–126)
Anion gap: 9 (ref 5–15)
BUN: 12 mg/dL (ref 8–23)
CO2: 27 mmol/L (ref 22–32)
Calcium: 8.7 mg/dL — ABNORMAL LOW (ref 8.9–10.3)
Chloride: 105 mmol/L (ref 98–111)
Creatinine, Ser: 0.9 mg/dL (ref 0.61–1.24)
GFR, Estimated: >60 mL/min (ref >60)
Glucose, Bld: 135 mg/dL — ABNORMAL HIGH (ref 70–99)
Potassium: 3.8 mmol/L (ref 3.5–5.1)
Sodium: 141 mmol/L (ref 135–145)
Total Bilirubin: 0.6 mg/dL (ref 0.3–1.2)
Total Protein: 6.3 g/dL — ABNORMAL LOW (ref 6.5–8.1)

## 2020-05-03 LAB — C-REACTIVE PROTEIN: CRP: 0.7 mg/dL (ref <1.0)

## 2020-05-03 LAB — GLUCOSE, CAPILLARY
Glucose-Capillary: 121 mg/dL — ABNORMAL HIGH (ref 70–99)
Glucose-Capillary: 129 mg/dL — ABNORMAL HIGH (ref 70–99)
Glucose-Capillary: 101 mg/dL — ABNORMAL HIGH (ref 70–99)
Glucose-Capillary: 140 mg/dL — ABNORMAL HIGH (ref 70–99)

## 2020-05-03 LAB — CBC WITH DIFFERENTIAL/PLATELET
Abs Immature Granulocytes: 0.02 10*3/uL (ref 0.00–0.07)
Basophils Absolute: 0.1 10*3/uL (ref 0.0–0.1)
Basophils Relative: 1 %
Eosinophils Absolute: 0.3 10*3/uL (ref 0.0–0.5)
Eosinophils Relative: 5 %
HCT: 33.3 % — ABNORMAL LOW (ref 39.0–52.0)
Hemoglobin: 10.7 g/dL — ABNORMAL LOW (ref 13.0–17.0)
Immature Granulocytes: 0 %
Lymphocytes Relative: 27 %
Lymphs Abs: 1.7 10*3/uL (ref 0.7–4.0)
MCH: 29.5 pg (ref 26.0–34.0)
MCHC: 32.1 g/dL (ref 30.0–36.0)
MCV: 91.7 fL (ref 80.0–100.0)
Monocytes Absolute: 0.6 10*3/uL (ref 0.1–1.0)
Monocytes Relative: 10 %
Neutro Abs: 3.6 10*3/uL (ref 1.7–7.7)
Neutrophils Relative %: 57 %
Platelets: 130 10*3/uL — ABNORMAL LOW (ref 150–400)
RBC: 3.63 MIL/uL — ABNORMAL LOW (ref 4.22–5.81)
RDW: 14.5 % (ref 11.5–15.5)
WBC: 6.3 10*3/uL (ref 4.0–10.5)
nRBC: 0 % (ref 0.0–0.2)

## 2020-05-03 LAB — MAGNESIUM: Magnesium: 2 mg/dL (ref 1.7–2.4)

## 2020-05-03 LAB — PROCALCITONIN: Procalcitonin: <0.1 ng/mL

## 2020-05-03 LAB — D-DIMER, QUANTITATIVE: D-Dimer, Quant: 1.3 ug/mL-FEU — ABNORMAL HIGH (ref 0.00–0.50)

## 2020-05-03 LAB — BRAIN NATRIURETIC PEPTIDE: B Natriuretic Peptide: 105.3 pg/mL — ABNORMAL HIGH (ref 0.0–100.0)

## 2020-05-03 SURGERY — INVASIVE LAB ABORTED CASE
Anesthesia: Monitor Anesthesia Care

## 2020-05-03 MED ORDER — FLUTICASONE PROPIONATE 50 MCG/ACT NA SUSP
1.0000 | Freq: Every day | NASAL | Status: DC
  Administered 2020-05-04 – 2020-05-07 (×2): 1 via NASAL
  Filled 2020-05-03: qty 16

## 2020-05-03 MED ORDER — TORSEMIDE 20 MG PO TABS
20.0000 mg | ORAL_TABLET | Freq: Every day | ORAL | Status: DC
  Administered 2020-05-03 – 2020-05-07 (×4): 20 mg via ORAL
  Filled 2020-05-03 (×5): qty 1

## 2020-05-03 MED ORDER — SODIUM CHLORIDE 0.9 % IV SOLN: INTRAVENOUS | Status: DC

## 2020-05-03 MED ORDER — BUTAMBEN-TETRACAINE-BENZOCAINE 2-2-14 % EX AERO
INHALATION_SPRAY | CUTANEOUS | Status: DC | PRN
  Administered 2020-05-03: 2 via TOPICAL

## 2020-05-03 MED ORDER — SALINE SPRAY 0.65 % NA SOLN
1.0000 | NASAL | Status: DC | PRN
  Administered 2020-05-03: 1 via NASAL
  Filled 2020-05-03: qty 44

## 2020-05-03 MED ORDER — LACTATED RINGERS IV SOLN: INTRAVENOUS | Status: DC | PRN

## 2020-05-03 MED ORDER — PROPOFOL 500 MG/50ML IV EMUL
INTRAVENOUS | Status: DC | PRN
  Administered 2020-05-03: 75 ug/kg/min via INTRAVENOUS

## 2020-05-03 MED ORDER — PROPOFOL 10 MG/ML IV BOLUS
INTRAVENOUS | Status: DC | PRN
  Administered 2020-05-03: 20 mg via INTRAVENOUS

## 2020-05-03 MED ORDER — SPIRONOLACTONE 12.5 MG HALF TABLET
12.5000 mg | ORAL_TABLET | Freq: Every day | ORAL | Status: DC
  Administered 2020-05-03 – 2020-05-07 (×4): 12.5 mg via ORAL
  Filled 2020-05-03 (×5): qty 1

## 2020-05-03 MED ORDER — LIDOCAINE 2% (20 MG/ML) 5 ML SYRINGE
INTRAMUSCULAR | Status: DC | PRN
  Administered 2020-05-03: 60 mg via INTRAVENOUS

## 2020-05-03 NOTE — Progress Notes (Signed)
PROGRESS NOTE                                                                                                                                                                                                             Patient Demographics:    Tim [name], is a 79 y.o. male, DOB - 02-11-1941, VCB:[id number]  Outpatient Primary MD for the patient is McGowen, Adrian Blackwater, MD    LOS - 4  Admit date - 04/28/2020    Chief Complaint  Patient presents with  . Weakness       Brief Narrative (HPI from H&P)   Tim [name] is a 79 y.o. 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   Subjective:   Patient in bed, appears comfortable, denies any headache, no fever, no chest pain or pressure, no shortness of breath , no abdominal pain. No focal weakness.   Assessment  & Plan :    1. 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 On chronic amoxicillin treatment under the care of Dr. Drucilla Schmidt ID. Now 1.3 x 1 cm mobile vegetation attached to the anterior mitral leaflet on TTE.  Now he seems to have new mitral valve endocarditis per TTE, ID following, TEE failed on  05/03/2020 due to possible esophageal stricture.  Blood cultures drawn this admission so far negative, continue empiric IV Rocephin 2 g and ampicillin.  Thereafter per ID.  Cardiology also following for TEE and chest pain.  PICC line placed 05/01/2020.    2. Paroxysmal A. fib. [place] vas score 2 score of greater than 3. Continue Eliquis, resume beta-blocker as blood pressure has improved .  3. Ischemic cardiomyopathy with chronic systolic heart failure EF around 40%. He has chronic 2+ leg edema, pressure is improved low-dose beta-blocker, diuretics as tolerated by blood pressure and renal function, he had a brief episode of chest pain on 05/01/2020 with unchanged EKG, added Imdur, undergoing TEE soon, holding Entresto for now due to AKI and soft blood pressures. Cardiology on board due to few episodes of chest pain.  4. Anemia of chronic disease. Stable follow.  5. COPD. Stable no wheezing.  6. BPH. Resume alpha-blocker.  7.  Hypomagnesemia.  Replaced.    8.  AKI.  Resolved, resume low-dose diuretics at half home dose, continue to hold Entresto due to hypotension.  9.  Possible esophageal stricture noted on TEE.  GI has been consulted by cardiology.    10.Thrombocytopenia. Likely due to infection monitor.  Lab Results  Component Value Date   PLT 130 (L) 05/03/2020    8. Prediabetes. Monitor. Add ISS as he stress-induced hyperglycemia.  Lab Results  Component Value Date   HGBA1C 6.1 (H) 04/29/2020   CBG (last 3)  Recent Labs    05/02/20 1644 05/02/20 2200 05/03/20 0627  GLUCAP 135* 130* 121*   Lab Results  Component Value Date   TSH 0.905 04/29/2020      Condition - Extremely Guarded  Family Communication  :  Wife bedside 04/29/20, called [phone] 04/30/2020 at 10:30 AM and left message, updated bedside 05/03/2020  Code Status : DNR  Consults  :  ID, cardiology, GI  Procedures  :    PICC line placed 05/01/2020.   CT L Foot - no abscess  TTE - 1. Left  ventricular ejection fraction, by estimation, is 55 to 60%. The left ventricle has normal function. The left ventricle has no regional wall motion abnormalities. Left ventricular diastolic parameters are consistent with Grade I diastolic dysfunction (impaired relaxation).  2. Right ventricular systolic function is normal. The right ventricular size is mildly enlarged. Tricuspid regurgitation signal is inadequate for assessing PA pressure.  3. Left atrial size was mildly dilated.  4. The mitral valve is degenerative. No evidence of mitral valve regurgitation. Mild mitral stenosis. The mean mitral valve gradient is 5.0 mmHg. Moderate mitral annular calcification. There is a 1.3 x 1 cm mobile vegetation attached to the anterior mitral leaflet.  5. The aortic valve was not well visualized. Aortic valve regurgitation is not visualized. Mild aortic valve stenosis. Aortic valve mean gradient measures 16.0 mmHg.  6. The inferior vena cava is normal in size with greater than 50% respiratory variability, suggesting right atrial pressure of 3 mmHg.  7. Technically difficult study with very poor windows. Possible mitral valve vegetation. The tricuspid valve, aortic valve, and ICD were poorly visualized  PUD Prophylaxis : PPI  Disposition Plan  :    Status is: Inpatient  Remains inpatient appropriate because:IV treatments appropriate due to intensity of illness or inability to take PO   Dispo: The patient is from: Home              Anticipated d/c is to: Home              Anticipated d/c date is: > 3 days              Patient currently is not medically stable to d/c.  DVT Prophylaxis  : Eliquis  Lab Results  Component Value Date   PLT 130 (L) 05/03/2020    Diet :  Diet Order    None  Inpatient Medications  Scheduled Meds: . allopurinol  100 mg Oral BID  . apixaban  5 mg Oral BID  . Chlorhexidine Gluconate Cloth  6 each Topical Daily  . ezetimibe  10 mg Oral Daily  . finasteride  5 mg Oral  Daily  . gabapentin  800 mg Oral QID  . insulin aspart  0-9 Units Subcutaneous TID WC  . isosorbide mononitrate  30 mg Oral Daily  . magnesium oxide  400 mg Oral Daily  . metoprolol succinate  25 mg Oral Daily  . mometasone-formoterol  2 puff Inhalation BID  .  morphine injection  2 mg Intravenous Once  . pantoprazole  40 mg Oral Q1200  . rosuvastatin  40 mg Oral QHS  . sacubitril-valsartan  1 tablet Oral BID  . sodium chloride flush  10-40 mL Intracatheter Q12H   Continuous Infusions: . ampicillin (OMNIPEN) IV 2 g (05/03/20 0420)  . cefTRIAXone (ROCEPHIN)  IV 2 g (05/02/20 2122)   PRN Meds:.albuterol, nitroGLYCERIN, sodium chloride flush  Antibiotics  :    Anti-infectives (From admission, onward)   Start     Dose/Rate Route Frequency Ordered Stop   04/29/20 2200  cefTRIAXone (ROCEPHIN) 2 g in sodium chloride 0.9 % 100 mL IVPB  Status:  Discontinued        2 g 200 mL/hr over 30 Minutes Intravenous Every 24 hours 04/29/20 0013 04/29/20 1206   04/29/20 1600  ampicillin (OMNIPEN) 2 g in sodium chloride 0.9 % 100 mL IVPB        2 g 300 mL/hr over 20 Minutes Intravenous Every 4 hours 04/29/20 1206     04/29/20 1300  cefTRIAXone (ROCEPHIN) 2 g in sodium chloride 0.9 % 100 mL IVPB        2 g 200 mL/hr over 30 Minutes Intravenous Every 12 hours 04/29/20 1206     04/29/20 0600  ampicillin (OMNIPEN) 2 g in sodium chloride 0.9 % 100 mL IVPB  Status:  Discontinued        2 g 300 mL/hr over 20 Minutes Intravenous Every 6 hours 04/29/20 0013 04/29/20 1206   04/28/20 2215  cefTRIAXone (ROCEPHIN) 1 g in sodium chloride 0.9 % 100 mL IVPB  Status:  Discontinued        1 g 200 mL/hr over 30 Minutes Intravenous  Once 04/28/20 2201 04/28/20 2211   04/28/20 2215  ampicillin (OMNIPEN) 2 g in sodium chloride 0.9 % 100 mL IVPB        2 g 300 mL/hr over 20 Minutes Intravenous  Once 04/28/20 2201 04/29/20 0027   04/28/20 2215  cefTRIAXone (ROCEPHIN) 2 g in sodium chloride 0.9 % 100 mL IVPB        2  g 200 mL/hr over 30 Minutes Intravenous  Once 04/28/20 2211 04/28/20 2314       Time Spent in minutes  30   Lala Lund M.D on 05/03/2020 at 9:49 AM  To page go to [email or web address] - password [facility]  [facility] -  Office  [phone]   See all Orders from today for further details    Objective:   Vitals:   05/03/20 0800 05/03/20 0903 05/03/20 0913 05/03/20 0923  BP: 135/86 (!) 103/35 (!) 101/43 (!) 116/31  Pulse: 68 62 63   Resp: 14 17 12    Temp: 98 F (36.7 C) 98.1 F (36.7 C)    TempSrc: Oral Oral    SpO2: 100% 100% 100%   Weight: (!) 148.6 kg  Height:        Wt Readings from Last 3 Encounters:  05/03/20 (!) 148.6 kg  04/21/20 (!) 141.5 kg  04/19/20 (!) 145.2 kg     Intake/Output Summary (Last 24 hours) at 05/03/2020 0949 Last data filed at 05/03/2020 0856 Gross per 24 hour  Intake 840 ml  Output 1200 ml  Net -360 ml     Physical Exam  Awake Alert, No new F.N deficits, Normal affect [place].AT,PERRAL Supple Neck,No JVD, No cervical lymphadenopathy appriciated.  Symmetrical Chest wall movement, Good air movement bilaterally, CTAB RRR,No Gallops, +ve aortic systolic murmur +ve B.Sounds, Abd Soft, No tenderness, No organomegaly appriciated, No rebound - guarding or rigidity. left foot transmetatarsal amputation with a small plantar surface chronic shallow ulcer without any signs of active infection overlying first metatarsal head. He has chronic 2+ edema in both lower extremities left more than right.    Data Review:    CBC Recent Labs  Lab 04/28/20 2118 04/28/20 2118 04/29/20 0759 04/30/20 0202 05/01/20 0319 05/02/20 0241 05/03/20 0350  WBC 16.0*   < > 15.0* 7.0 5.7 5.6 6.3  HGB 11.7*   < > 10.4* 10.5* 9.9* 9.3* 10.7*  HCT 36.6*   < > 33.6* 32.7* 30.7* 28.9* 33.3*  PLT 106*   < > 99* 91* 98* 104* 130*  MCV 93.4   < > 93.6 90.8 89.2 90.3 91.7  MCH 29.8   < > 29.0 29.2 28.8 29.1 29.5  MCHC 32.0   < > 31.0 32.1 32.2 32.2 32.1  RDW 14.7    < > 14.7 14.7 14.6 14.5 14.5  LYMPHSABS 0.6*  --   --  1.3 1.3 1.3 1.7  MONOABS 1.4*  --   --  0.8 0.8 0.7 0.6  EOSABS 0.1  --   --  0.1 0.2 0.2 0.3  BASOSABS 0.0  --   --  0.0 0.0 0.0 0.1   < > = values in this interval not displayed.    Recent Labs  Lab 04/28/20 2118 04/28/20 2133 04/28/20 2338 04/29/20 0759 04/29/20 0801 04/29/20 0828 04/30/20 0202 05/01/20 0319 05/02/20 0241 05/03/20 0350  NA   < >  --   --  141  --   --  140 140 142 141  K   < >  --   --  4.0  --   --  4.0 3.7 3.5 3.8  CL   < >  --   --  110  --   --  109 105 103 105  CO2   < >  --   --  23  --   --  25 26 29 27   GLUCOSE   < >  --   --  125*  --   --  127* 121* 123* 135*  BUN   < >  --   --  22  --   --  18 21 18 12   CREATININE   < >  --   --  1.09  --   --  0.90 1.27* 1.15 0.90  CALCIUM   < >  --   --  8.3*  --   --  8.6* 8.5* 8.5* 8.7*  AST  --   --   --  26  --   --  35 28 25 30   ALT  --   --   --  21  --   --  23 23 21 25   ALKPHOS  --   --   --  48  --   --  48 44 43 52  BILITOT  --   --   --  0.8  --   --  0.7 0.5 0.7 0.6  ALBUMIN  --   --   --  2.7*  --   --  2.8* 2.8* 2.8* 3.1*  MG  --   --   --  1.4*  --   --  1.6* 1.6* 1.8 2.0  CRP  --   --   --   --   --   --  4.0* 1.9* 1.0* 0.7  DDIMER  --   --   --  1.33*  --   --  0.93* 0.79* 0.67* 1.30*  PROCALCITON  --   --   --  0.77  --   --  0.55 0.43 0.24 <0.10  LATICACIDVEN  --  1.8 2.0*  --  1.3  --   --   --   --   --   TSH  --   --   --   --   --  0.905  --   --   --   --   HGBA1C  --   --   --  6.1*  --   --   --   --   --   --   BNP  --   --   --  82.7  --   --  82.6 94.0 70.3 105.3*   < > = values in this interval not displayed.    ------------------------------------------------------------------------------------------------------------------ No results for input(s): CHOL, HDL, LDLCALC, TRIG, CHOLHDL, LDLDIRECT in the last 72 hours.  Lab Results  Component Value Date   HGBA1C 6.1 (H) 04/29/2020    ------------------------------------------------------------------------------------------------------------------ No results for input(s): TSH, T4TOTAL, T3FREE, THYROIDAB in the last 72 hours.  Invalid input(s): FREET3  Cardiac Enzymes No results for input(s): CKMB, TROPONINI, MYOGLOBIN in the last 168 hours.  Invalid input(s): CK ------------------------------------------------------------------------------------------------------------------    Component Value Date/Time   BNP 105.3 (H) 05/03/2020 0350   BNP 32.0 01/14/2014 1656    Micro Results Recent Results (from the past 240 hour(s))  Respiratory Panel by RT PCR (Flu A&B, Covid) - Nasopharyngeal Swab     Status: None   Collection Time: 04/28/20 10:43 PM   Specimen: Nasopharyngeal Swab  Result Value Ref Range Status   SARS Coronavirus 2 by RT PCR NEGATIVE NEGATIVE Final    Comment: (NOTE) SARS-CoV-2 target nucleic acids are NOT DETECTED.  The SARS-CoV-2 RNA is generally detectable in upper respiratoy specimens during the acute phase of infection. The lowest concentration of SARS-CoV-2 viral copies this assay can detect is 131 copies/mL. A negative result does not preclude SARS-Cov-2 infection and should not be used as the sole basis for treatment or other patient management decisions. A negative result may occur with  improper specimen collection/handling, submission of specimen other than nasopharyngeal swab, presence of viral mutation(s) within the areas targeted by this assay, and inadequate number of viral copies (<131 copies/mL). A negative result must be combined with clinical observations, patient history, and epidemiological information. The expected result is Negative.  Fact Sheet for Patients:  PinkCheek.be  Fact Sheet for Healthcare Providers:  GravelBags.it  This test is no t yet approved or cleared by the [place] FDA and  has been  authorized for detection and/or diagnosis of SARS-CoV-2 by FDA under an Emergency Use Authorization (EUA). This EUA will remain  in effect (meaning this test can be used)  for the duration of the COVID-19 declaration under Section 564(b)(1) of the Act, 21 U.S.C. section 360bbb-3(b)(1), unless the authorization is terminated or revoked sooner.     Influenza A by PCR NEGATIVE NEGATIVE Final   Influenza B by PCR NEGATIVE NEGATIVE Final    Comment: (NOTE) The Xpert Xpress SARS-CoV-2/FLU/RSV assay is intended as an aid in  the diagnosis of influenza from Nasopharyngeal swab specimens and  should not be used as a sole basis for treatment. Nasal washings and  aspirates are unacceptable for Xpert Xpress SARS-CoV-2/FLU/RSV  testing.  Fact Sheet for Patients: PinkCheek.be  Fact Sheet for Healthcare Providers: GravelBags.it  This test is not yet approved or cleared by the [place] FDA and  has been authorized for detection and/or diagnosis of SARS-CoV-2 by  FDA under an Emergency Use Authorization (EUA). This EUA will remain  in effect (meaning this test can be used) for the duration of the  Covid-19 declaration under Section 564(b)(1) of the Act, 21  U.S.C. section 360bbb-3(b)(1), unless the authorization is  terminated or revoked. Performed at [facility], [place] [address] [place], [place] [address]   Blood culture (routine x 2)     Status: None (Preliminary result)   Collection Time: 04/28/20 11:38 PM   Specimen: BLOOD RIGHT FOREARM  Result Value Ref Range Status   Specimen Description BLOOD RIGHT FOREARM  Final   Special Requests   Final    BOTTLES DRAWN AEROBIC AND ANAEROBIC Blood Culture results may not be optimal due to an inadequate volume of blood received in culture bottles   Culture   Final    NO GROWTH 4 DAYS Performed at [facility], [place] [address], [place], [place] [address]    Report Status  PENDING  Incomplete  Blood culture (routine x 2)     Status: None (Preliminary result)   Collection Time: 04/28/20 11:50 PM   Specimen: BLOOD LEFT HAND  Result Value Ref Range Status   Specimen Description BLOOD LEFT HAND  Final   Special Requests   Final    BOTTLES DRAWN AEROBIC AND ANAEROBIC Blood Culture results may not be optimal due to an inadequate volume of blood received in culture bottles   Culture   Final    NO GROWTH 4 DAYS Performed at [facility], [place] [address] [place], [place] [address]    Report Status PENDING  Incomplete    Radiology Reports CT FOOT LEFT WO CONTRAST  Result Date: 04/29/2020 CLINICAL DATA:  Question of osteomyelitis EXAM: CT OF THE LEFT FOOT WITHOUT CONTRAST TECHNIQUE: Multidetector CT imaging of the left foot was performed according to the standard protocol. Multiplanar CT image reconstructions were also generated. COMPARISON:  None. FINDINGS: Bones/Joint/Cartilage The patient is status post transmetatarsal first through fifth digit amputation. There is no definite areas of cortical destruction or periosteal reaction noted. The amputation sites appear to be grossly intact. 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 Calcaneal enthesophyte seen on the plantar surface. There is diffuse osteopenia. Ligaments Suboptimally assessed by CT. Muscles and Tendons There is diffuse fatty atrophy of the muscles surrounding the forefoot. The flexor extensor tendons appear to be grossly intact. The plantar fascia appears to be intact. Soft tissues Overlying the plantar the near the first metatarsal amputation site there is a focal area of superficial ulceration with diffuse skin thickening and subcutaneous edema. No definite loculated fluid collection or subcutaneous emphysema is seen. IMPRESSION: Superficial ulceration along the plantar base  beneath the first metatarsal amputation site with cellulitis and postsurgical changes. No definite loculated  fluid collection or subcutaneous emphysema. Status post transmetatarsal amputation of the first through fifth digits without definite evidence of osteomyelitis. Electronically Signed   By: Prudencio Pair M.D.   On: 04/29/2020 03:30   DG Chest Port 1 View  Result Date: 04/28/2020 CLINICAL DATA:  Chest pain, weakness and dizziness. EXAM: PORTABLE CHEST 1 VIEW COMPARISON:  01/20/2020 FINDINGS: Mild patient rotation. Multi lead left-sided pacemaker. Cardiomegaly slightly increased from prior exam. Unchanged mediastinal contours. Aortic atherosclerosis. Mild vascular engorgement. No pulmonary edema. No focal airspace disease. No large pleural effusion. No pneumothorax. The bones appear under mineralized. Spinal stimulator in place. Multiple overlying monitoring devices. No acute osseous abnormalities are seen. IMPRESSION: Cardiomegaly with mild vascular congestion. Electronically Signed   By: Keith Rake M.D.   On: 04/28/2020 21:40   ECHOCARDIOGRAM COMPLETE  Result Date: 04/29/2020    ECHOCARDIOGRAM REPORT   Patient Name:   ALVEY BROCKEL Date of Exam: 04/29/2020 Medical Rec #:  [id number]       Height:       72.0 in Accession #:    [id number]      Weight:       320.0 lb Date of Birth:  [birth year]       BSA:          2.602 m Patient Age:    73 years        BP:           127/96 mmHg Patient Gender: M               HR:           66 bpm. Exam Location:  Inpatient Procedure: 2D Echo and Intracardiac Opacification Agent Indications:    endocarditis  History:        Patient has prior history of Echocardiogram examinations, most                 recent 01/16/2020. CAD, Defibrillator, COPD,                 Signs/Symptoms:Fever; Risk Factors:Sleep Apnea.  Sonographer:    Johny Chess Referring Phys: Graylin Shiver [facility]  Sonographer Comments: Suboptimal parasternal window and patient is morbidly obese. Image acquisition challenging due to patient body habitus. IMPRESSIONS  1. Left ventricular ejection fraction,  by estimation, is 55 to 60%. The left ventricle has normal function. The left ventricle has no regional wall motion abnormalities. Left ventricular diastolic parameters are consistent with Grade I diastolic dysfunction (impaired relaxation).  2. Right ventricular systolic function is normal. The right ventricular size is mildly enlarged. Tricuspid regurgitation signal is inadequate for assessing PA pressure.  3. Left atrial size was mildly dilated.  4. The mitral valve is degenerative. No evidence of mitral valve regurgitation. Mild mitral stenosis. The mean mitral valve gradient is 5.0 mmHg. Moderate mitral annular calcification. There is a 1.3 x 1 cm mobile vegetation attached to the anterior mitral leaflet.  5. The aortic valve was not well visualized. Aortic valve regurgitation is not visualized. Mild aortic valve stenosis. Aortic valve mean gradient measures 16.0 mmHg.  6. The inferior vena cava is normal in size with greater than 50% respiratory variability, suggesting right atrial pressure of 3 mmHg.  7. Technically difficult study with very poor windows. Possible mitral valve vegetation. The tricuspid valve, aortic valve, and ICD were poorly visualized. Suggest TEE. FINDINGS  Left Ventricle: Left ventricular ejection  fraction, by estimation, is 55 to 60%. The left ventricle has normal function. The left ventricle has no regional wall motion abnormalities. Definity contrast agent was given IV to delineate the left ventricular  endocardial borders. The left ventricular internal cavity size was normal in size. There is no left ventricular hypertrophy. Left ventricular diastolic parameters are consistent with Grade I diastolic dysfunction (impaired relaxation). Right Ventricle: The right ventricular size is mildly enlarged. No increase in right ventricular wall thickness. Right ventricular systolic function is normal. Tricuspid regurgitation signal is inadequate for assessing PA pressure. Left Atrium: Left  atrial size was mildly dilated. Right Atrium: Right atrial size was normal in size. Pericardium: There is no evidence of pericardial effusion. Mitral Valve: The mitral valve is degenerative in appearance. There is moderate thickening of the mitral valve leaflet(s). Moderate mitral annular calcification. No evidence of mitral valve regurgitation. Mild mitral valve stenosis. MV peak gradient, 10.1 mmHg. The mean mitral valve gradient is 5.0 mmHg. Tricuspid Valve: The tricuspid valve is not well visualized. Tricuspid valve regurgitation is not demonstrated. Aortic Valve: The aortic valve was not well visualized. Aortic valve regurgitation is not visualized. Mild aortic stenosis is present. Aortic valve mean gradient measures 16.0 mmHg. Aortic valve peak gradient measures 30.9 mmHg. Pulmonic Valve: The pulmonic valve was not well visualized. Pulmonic valve regurgitation is not visualized. Aorta: Aortic root could not be assessed. Venous: The inferior vena cava is normal in size with greater than 50% respiratory variability, suggesting right atrial pressure of 3 mmHg. IAS/Shunts: No atrial level shunt detected by color flow Doppler. Additional Comments: A pacer wire is visualized in the right ventricle.   Diastology LV e' medial:    6.74 cm/s LV E/e' medial:  18.4 LV e' lateral:   8.05 cm/s LV E/e' lateral: 15.4  RIGHT VENTRICLE             IVC RV S prime:     15.60 cm/s  IVC diam: 1.70 cm TAPSE (M-mode): 3.0 cm LEFT ATRIUM             Index LA Vol (A2C):   78.1 ml 30.02 ml/m LA Vol (A4C):   90.2 ml 34.67 ml/m LA Biplane Vol: 91.5 ml 35.17 ml/m  AORTIC VALVE AV Vmax:           278.00 cm/s AV Vmean:          182.000 cm/s AV VTI:            0.576 m AV Peak Grad:      30.9 mmHg AV Mean Grad:      16.0 mmHg LVOT Vmax:         119.00 cm/s LVOT Vmean:        81.800 cm/s LVOT VTI:          0.236 m LVOT/AV VTI ratio: 0.41 MITRAL VALVE MV Area (PHT): 2.05 cm     SHUNTS MV Peak grad:  10.1 mmHg    Systemic VTI: 0.24 m MV Mean  grad:  5.0 mmHg MV Vmax:       1.59 m/s MV Vmean:      100.0 cm/s MV Decel Time: 370 msec MV E velocity: 124.00 cm/s MV A velocity: 132.00 cm/s MV E/A ratio:  0.94 Loralie Champagne MD Electronically signed by Loralie Champagne MD Signature Date/Time: 04/29/2020/6:03:22 PM    Final    [name] EKG SITE RITE  Result Date: 04/30/2020 If Site Rite image not attached, placement could not be confirmed due to current  cardiac rhythm.

## 2020-05-03 NOTE — Anesthesia Procedure Notes (Signed)
Procedure Name: MAC Date/Time: 05/03/2020 8:40 AM Performed by: Trinna Post., CRNA Pre-anesthesia Checklist: Patient identified, Emergency Drugs available, Suction available, Patient being monitored and Timeout performed Patient Re-evaluated:Patient Re-evaluated prior to induction Oxygen Delivery Method: Nasal cannula Preoxygenation: Pre-oxygenation with 100% oxygen Induction Type: IV induction Placement Confirmation: positive ETCO2

## 2020-05-03 NOTE — Anesthesia Postprocedure Evaluation (Signed)
Anesthesia Post Note  Patient: Tim [name]  Procedure(s) Performed: TRANSESOPHAGEAL ECHOCARDIOGRAM (TEE) ([name] ) INVASIVE LAB ABORTED CASE     Patient location during evaluation: PACU Anesthesia Type: MAC Level of consciousness: awake and alert Pain management: pain level controlled Vital Signs Assessment: post-procedure vital signs reviewed and stable Respiratory status: spontaneous breathing and respiratory function stable Cardiovascular status: stable Postop Assessment: no apparent nausea or vomiting Anesthetic complications: no   No complications documented.  Last Vitals:  Vitals:   05/03/20 0952 05/03/20 1134  BP: (!) 116/49 137/62  Pulse: 64 67  Resp:  18  Temp:  36.6 C  SpO2:  99%    Last Pain:  Vitals:   05/03/20 1134  TempSrc: Oral  PainSc:                  Merlinda Frederick

## 2020-05-03 NOTE — Progress Notes (Signed)
Pt has home unit and places self on/off as needed. RT will monitor.

## 2020-05-03 NOTE — Anesthesia Preprocedure Evaluation (Signed)
Anesthesia Evaluation  Patient identified by MRN, date of birth, ID band Patient awake    Reviewed: Allergy & Precautions, NPO status , Patient's Chart, lab work & pertinent test results, reviewed documented beta blocker date and time   History of Anesthesia Complications Negative for: history of anesthetic complications  Airway Mallampati: II  TM Distance: >3 FB Neck ROM: Full    Dental no notable dental hx.    Pulmonary asthma , sleep apnea and Continuous Positive Airway Pressure Ventilation , COPD,  COPD inhaler,    Pulmonary exam normal        Cardiovascular hypertension, Pt. on medications and Pt. on home beta blockers + CAD, + Past MI, + Cardiac Stents (2017 on Eliquis) and +CHF  Normal cardiovascular exam+ dysrhythmias Atrial Fibrillation + pacemaker + Cardiac Defibrillator   S/P TAVR 2017    Neuro/Psych  Neuromuscular disease (peripheral neuropathy) negative psych ROS   GI/Hepatic Neg liver ROS, hiatal hernia, PUD, GERD  Medicated and Controlled,  Endo/Other  diabetes, Type 2Morbid obesity  Renal/GU negative Renal ROS  negative genitourinary   Musculoskeletal  (+) Arthritis ,   Abdominal   Peds  Hematology  (+) anemia ,   Anesthesia Other Findings Bacteremia  Reproductive/Obstetrics negative OB ROS                             Anesthesia Physical  Anesthesia Plan  ASA: III  Anesthesia Plan: MAC   Post-op Pain Management:    Induction:   PONV Risk Score and Plan: 1 and Treatment may vary due to age or medical condition and Propofol infusion  Airway Management Planned: Natural Airway and Nasal Cannula  Additional Equipment:   Intra-op Plan:   Post-operative Plan:   Informed Consent: I have reviewed the patients History and Physical, chart, labs and discussed the procedure including the risks, benefits and alternatives for the proposed anesthesia with the patient or  authorized representative who has indicated his/her understanding and acceptance.   Patient has DNR.  Discussed DNR with patient.     Plan Discussed with: CRNA and Anesthesiologist  Anesthesia Plan Comments: (Patient is ok with intubation for procedures and medications for HR/BP. )        Anesthesia Quick Evaluation

## 2020-05-03 NOTE — Interval H&P Note (Signed)
History and Physical Interval Note:  05/03/2020 8:26 AM  Tim [name]  has presented today for surgery, with the diagnosis of BACTEREMIA.  The various methods of treatment have been discussed with the patient and family. After consideration of risks, benefits and other options for treatment, the patient has consented to  Procedure(s): TRANSESOPHAGEAL ECHOCARDIOGRAM (TEE) ([name]) as a surgical intervention.  The patient's history has been reviewed, patient examined, no change in status, stable for surgery.  I have reviewed the patient's chart and labs.  Questions were answered to the patient's satisfaction.     Fransico Him

## 2020-05-03 NOTE — CV Procedure (Signed)
    PROCEDURE NOTE:  Procedure:  Transesophageal echocardiogram Operator:  Fransico Him, MD Indications:  bacteremia Complications: None  During this procedure the patient is administered a total of Propofol 100 mg to achieve and maintain moderate conscious sedation.  The patient's heart rate, blood pressure, and oxygen saturation are monitored continuously during the procedure by anesthesia.   Multiple attempts were made at passing TEE probe.  The probe easily passed to 30cm and then met abrupt resistance on 4 separate occasions.  TEE cancelled with plans for GI consult for endoscopy prior to re attempt at TEE.  GI has been consulted.  The patient tolerated the procedure well and was transferred back to their room in stable condition.  Signed: Fransico Him, MD [facility]

## 2020-05-03 NOTE — Progress Notes (Signed)
Visited with patient and his wife this morning.  Patient is complaining that Profore wrap on the left leg is to site tight and he wants it removed   Vital signs stable Profore wraps are in place appear to be appropriate.  Patient did have more swelling and erythema on the left leg.   Will consult wound care to remove Profore wraps if patient cannot tolerate would replace with CircAid wraps

## 2020-05-03 NOTE — Progress Notes (Signed)
Physical Therapy Treatment Patient Details Name: Tim [name] MRN: [id number] DOB: 06-12-1941 Today's Date: 05/03/2020    History of Present Illness 79 y.o. 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    PT Comments    Pt supine in bed on arrival this session.  Pt required decreased assistance for transfers.  He utilized RW this session and was more fatigued and did not have energy to climb stairs this session. Continue to recommend no PT follow up as he continues to improve.      Follow Up Recommendations  No PT follow up;Supervision - Intermittent     Equipment Recommendations  None recommended by PT    Recommendations for Other Services       Precautions / Restrictions Precautions Precautions: Fall Restrictions Weight Bearing Restrictions: No    Mobility  Bed Mobility Overal bed mobility: Needs Assistance Bed Mobility: Supine to Sit;Sit to Supine     Supine to sit: Modified independent (Device/Increase time);HOB elevated Sit to supine: Modified independent (Device/Increase time);HOB elevated      Transfers Overall transfer level: Needs assistance Equipment used: Rolling walker (2 wheeled) Transfers: Sit to/from Stand Sit to Stand: Supervision         General transfer comment: Cues for hand  placement to and from seated surface.  Ambulation/Gait Ambulation/Gait assistance: Supervision Gait Distance (Feet): 150 Feet Assistive device: Rolling walker (2 wheeled)   Gait velocity: reduced   General Gait Details: Pt with kyphotic posturing at baseline.  Pt able to perform increased gt training this session.  Standing rest periods x 2 due to fatigue.  Pt reports too fatigued to climb stairs this session.   Stairs             Wheelchair Mobility    Modified Rankin (Stroke Patients Only)       Balance Overall balance assessment: Needs assistance Sitting-balance support: No upper extremity supported;Feet supported Sitting balance-Leahy Scale: Good Sitting balance - Comments: supervision   Standing balance support: Single extremity supported Standing balance-Leahy Scale: Fair Standing balance comment: reliant on UE support of assistive device                            Cognition Arousal/Alertness: Awake/alert Behavior During Therapy: WFL for tasks assessed/performed Overall Cognitive Status: Within Functional Limits for tasks assessed                                        Exercises      General Comments        Pertinent Vitals/Pain Pain Assessment: Faces Faces Pain Scale: Hurts a little bit Pain Location: back, chronic Pain Descriptors / Indicators: Aching Pain Intervention(s): Monitored during session;Repositioned    Home Living  Prior Function            PT Goals (current goals can now be found in the care plan section) Acute Rehab PT Goals Patient Stated Goal: To return home and regain mobility at [facility] level. Potential to Achieve Goals: Good Progress towards PT goals: Progressing toward goals    Frequency    Min 3X/week      PT Plan Current plan remains appropriate    Co-evaluation              AM-PAC PT "6 Clicks" Mobility   Outcome Measure  Help needed turning  from your back to your side while in a flat bed without using bedrails?: None Help needed moving from lying on your back to sitting on the side of a flat bed without using bedrails?: None Help needed moving to and from a bed to a chair (including a wheelchair)?: A Little Help needed standing up from a chair using your arms (e.g., wheelchair or bedside chair)?: A Little Help needed to walk in hospital room?: A Little Help needed climbing 3-5 steps with a railing? : A Little 6 Click Score: 20    End of Session Equipment Utilized During Treatment: Gait belt Activity Tolerance: Patient tolerated treatment well Patient left: with call bell/phone within reach;with family/visitor present;in chair Nurse Communication: Mobility status PT Visit Diagnosis: Other abnormalities of gait and mobility (R26.89);Muscle weakness (generalized) (M62.81)     Time: 8335-8251 PT Time Calculation (min) (ACUTE ONLY): 29 min  Charges:  $Gait Training: 8-22 mins $Therapeutic Activity: 8-22 mins                     Erasmo Leventhal , PTA Acute Rehabilitation Services Pager [phone] Office [phone]     Tenea Sens [name] 05/03/2020, 3:59 PM

## 2020-05-03 NOTE — Consult Note (Signed)
[place] Nurse Consult Note: Patient receiving care in [place].  I spoke with his primary RN over the phone. Reason for Consult: Profore to left leg too tight; wants to have it removed and use the Ciraid wrap from home. I have entered an order to reflect that if the patient wants either, or both, profore wraps removed, for the primary RN to do so and allow him to use the Circaid wraps. Val Riles, RN, MSN, CWOCN, CNS-BC, pager [phone]

## 2020-05-03 NOTE — Progress Notes (Addendum)
Progress Note  Patient Name: Tim [name] Date of Encounter: 05/03/2020  [facility] Cardiologist: Kirk Ruths, MD   Subjective   Pt just back from TEE - unable to complete study bc unable to pass the probe. Keep NPO and consult GI for possible scope today.  Inpatient Medications    Scheduled Meds: . [MAR Hold] allopurinol  100 mg Oral BID  . [MAR Hold] apixaban  5 mg Oral BID  . [MAR Hold] Chlorhexidine Gluconate Cloth  6 each Topical Daily  . [MAR Hold] ezetimibe  10 mg Oral Daily  . [MAR Hold] finasteride  5 mg Oral Daily  . [MAR Hold] gabapentin  800 mg Oral QID  . [MAR Hold] insulin aspart  0-9 Units Subcutaneous TID WC  . [MAR Hold] isosorbide mononitrate  30 mg Oral Daily  . [MAR Hold] magnesium oxide  400 mg Oral Daily  . [MAR Hold] metoprolol succinate  25 mg Oral Daily  . [MAR Hold] mometasone-formoterol  2 puff Inhalation BID  . [MAR Hold]  morphine injection  2 mg Intravenous Once  . [MAR Hold] pantoprazole  40 mg Oral Q1200  . [MAR Hold] rosuvastatin  40 mg Oral QHS  . [MAR Hold] sacubitril-valsartan  1 tablet Oral BID  . [MAR Hold] sodium chloride flush  10-40 mL Intracatheter Q12H   Continuous Infusions: . sodium chloride 20 mL/hr at 05/03/20 0840  . [MAR Hold] ampicillin (OMNIPEN) IV 2 g (05/03/20 0420)  . [MAR Hold] cefTRIAXone (ROCEPHIN)  IV 2 g (05/02/20 2122)   PRN Meds: [MAR Hold] albuterol, butamben-tetracaine-benzocaine, [MAR Hold] nitroGLYCERIN, [MAR Hold] sodium chloride flush   Vital Signs    Vitals:   05/02/20 2341 05/03/20 0355 05/03/20 0628 05/03/20 0800  BP: (!) 123/53 (!) 101/41  135/86  Pulse: 72 75  68  Resp: 18 17  14   Temp: 98 F (36.7 C) (!) 97.4 F (36.3 C)  98 F (36.7 C)  TempSrc: Oral Oral  Oral  SpO2: 100% 99%  100%  Weight:   (!) 148.6 kg (!) 148.6 kg  Height:        Intake/Output Summary (Last 24 hours) at 05/03/2020 0903 Last data filed at 05/03/2020 0856 Gross per 24 hour  Intake 840 ml  Output 1200 ml    Net -360 ml   Last 3 Weights 05/03/2020 05/03/2020 05/02/2020  Weight (lbs) 327 lb 11.2 oz 327 lb 11.2 oz 324 lb 4.8 oz  Weight (kg) 148.644 kg 148.644 kg 147.102 kg      Telemetry    Paced rhythm with some PVCs, NSVT - Personally Reviewed  ECG    No new tracings - Personally Reviewed  Physical Exam   GEN: obese male in no acute distress.   Neck: No JVD - exam difficult Cardiac: RRR, systolic murmur Respiratory: Clear to auscultation bilaterally. GI: Soft, nontender, non-distended  MS: legs wrapped Neuro:  Nonfocal  Psych: Normal affect   Labs    High Sensitivity Troponin:   Recent Labs  Lab 04/28/20 2118 04/28/20 2336 04/29/20 0013 04/29/20 0759 05/01/20 1748  TROPONINIHS 11 16 15 15 8       Chemistry Recent Labs  Lab 05/01/20 0319 05/02/20 0241 05/03/20 0350  NA 140 142 141  K 3.7 3.5 3.8  CL 105 103 105  CO2 26 29 27   GLUCOSE 121* 123* 135*  BUN 21 18 12   CREATININE 1.27* 1.15 0.90  CALCIUM 8.5* 8.5* 8.7*  PROT 5.6* 5.6* 6.3*  ALBUMIN 2.8* 2.8* 3.1*  AST 28 25 30   ALT 23 21 25   ALKPHOS 44 43 52  BILITOT 0.5 0.7 0.6  GFRNONAA 57* >60 >60  ANIONGAP 9 10 9      Hematology Recent Labs  Lab 05/01/20 0319 05/02/20 0241 05/03/20 0350  WBC 5.7 5.6 6.3  RBC 3.44* 3.20* 3.63*  HGB 9.9* 9.3* 10.7*  HCT 30.7* 28.9* 33.3*  MCV 89.2 90.3 91.7  MCH 28.8 29.1 29.5  MCHC 32.2 32.2 32.1  RDW 14.6 14.5 14.5  PLT 98* 104* 130*    BNP Recent Labs  Lab 05/01/20 0319 05/02/20 0241 05/03/20 0350  BNP 94.0 70.3 105.3*     DDimer  Recent Labs  Lab 05/01/20 0319 05/02/20 0241 05/03/20 0350  DDIMER 0.79* 0.67* 1.30*     Radiology    No results found.  Cardiac Studies   TEE 05/03/20 - today  Patient Profile     79 y.o. 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 ([employer]) involvement Known PFO Prior Osteriomyelitis - TEE this admission with vegetation on ICD lead and TAVR valve - completed 6 weeks of IV ABX and now on oral suppressive therapy - following with ID - admitted with fever and chills 10/28 with echo showing new vegetation on mitral valve - TEE today to examine MV vegetation - blood cultures NGTD  - prior to admission, deemed too high risk for surgical intervention and lead extraction - unable to pass TEE probe due to obstruction --> will consult GI for possible scope today, keep NPO for now   CAD s/p BMS-RCA 2017, PTCA for ISR to RCA 2018, PCI/DES to rCA 2018 - no ASA due to [facility] - continue BB, statin, zetia   Ischemic cardiomyopathy Chronic systolic and diastolic heart failure Diabetes with HTN S/P BiV ICD - continue entresto --> pressures I nthe 110s - normal BNP at admission 70 --> 105 - will monitor fluid status with NPO status today, pt reports breathing at baseline - stuffy nose --> saline spray and flonase   Paroxysmal atrial fibrillation - CHADSVASC 6 - paced rhythm - continue  eliquis, BB      For questions or updates, please contact [facility] Please consult [email or web address] for contact info under        Signed, Ledora Bottcher, PA  05/03/2020, 9:03 AM     Personally seen and examined. 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  Possibility of new echodensity on mitral valve in the setting of infective endocarditis s/p medical therapy.  Of note, per patient, patient's outpatient electrophysiologist believe he would be able to successful perform laser lead extraction. Attempted TEE 05/03/20 but unable to advance probe past 30 cm.   Patient feels well and denies dyphagia, but notes that he has had problems swallowing phelgm on occasion.  No hemoptysis. Exam notable for obesity and a II/VI Systolic Crescendo /decresendo  Will attempt to get device interrogated with questions or ERI and underlying rhythm.   GI Consulted and will follow up their findings and recommendations.  At this time, patient is a poor candidate for open heart surgery and does not want this intervention if there is a mitral valve endocarditis.  No evidence of perforation on TTE.  Based on GI evaluation, it may be best to assume this is a vegetation and treat empirically if we cannot safely proceed with TEE.  Discussed at length with patient and wife.  Werner Lean, MD  ADDENDUM: Brief Interrogation done by [employer] Reps RE:  ERI. ERI 3.5 years to ERI Underlying rhythm  CHB. Optivol over threshold. Continue present course low threshold to start diuresis  Werner Lean, MD

## 2020-05-03 NOTE — Progress Notes (Signed)
[facility]   Reason for visit: Follow up on MV endocarditis  Interval History: TEE attempted today but unable to pass probe.  Afebrile, WBC wnl.  Ampicillin/ceftriaxone day 6   Physical Exam: Constitutional:  Vitals:   05/03/20 0923 05/03/20 0952  BP: (!) 116/31 (!) 116/49  Pulse:  64  Resp:    Temp:    SpO2:     patient appears in NAD Respiratory: Normal respiratory effort; CTA B Cardiovascular: RRR GI: soft, nt, nd  Review of Systems: Constitutional: negative for fevers and chills Gastrointestinal: negative for diarrhea Integument/breast: negative for rash  Lab Results  Component Value Date   WBC 6.3 05/03/2020   HGB 10.7 (L) 05/03/2020   HCT 33.3 (L) 05/03/2020   MCV 91.7 05/03/2020   PLT 130 (L) 05/03/2020    Lab Results  Component Value Date   CREATININE 0.90 05/03/2020   BUN 12 05/03/2020   NA 141 05/03/2020   K 3.8 05/03/2020   CL 105 05/03/2020   CO2 27 05/03/2020    Lab Results  Component Value Date   ALT 25 05/03/2020   AST 30 05/03/2020   ALKPHOS 52 05/03/2020     Microbiology: Recent Results (from the past 240 hour(s))  Respiratory Panel by RT PCR (Flu A&B, Covid) - Nasopharyngeal Swab     Status: None   Collection Time: 04/28/20 10:43 PM   Specimen: Nasopharyngeal Swab  Result Value Ref Range Status   SARS Coronavirus 2 by RT PCR NEGATIVE NEGATIVE Final    Comment: (NOTE) SARS-CoV-2 target nucleic acids are NOT DETECTED.  The SARS-CoV-2 RNA is generally detectable in upper respiratoy specimens during the acute phase of infection. The lowest concentration of SARS-CoV-2 viral copies this assay can detect is 131 copies/mL. A negative result does not preclude SARS-Cov-2 infection and should not be used as the sole basis for treatment or other patient management decisions. A negative result may occur with  improper specimen collection/handling, submission of specimen other than nasopharyngeal swab, presence  of viral mutation(s) within the areas targeted by this assay, and inadequate number of viral copies (<131 copies/mL). A negative result must be combined with clinical observations, patient history, and epidemiological information. The expected result is Negative.  Fact Sheet for Patients:  PinkCheek.be  Fact Sheet for Healthcare Providers:  GravelBags.it  This test is no t yet approved or cleared by the [place] FDA and  has been authorized for detection and/or diagnosis of SARS-CoV-2 by FDA under an Emergency Use Authorization (EUA). This EUA will remain  in effect (meaning this test can be used) for the duration of the COVID-19 declaration under Section 564(b)(1) of the Act, 21 U.S.C. section 360bbb-3(b)(1), unless the authorization is terminated or revoked sooner.     Influenza A by PCR NEGATIVE NEGATIVE Final   Influenza B by PCR NEGATIVE NEGATIVE Final    Comment: (NOTE) The Xpert Xpress SARS-CoV-2/FLU/RSV assay is intended as an aid in  the diagnosis of influenza from Nasopharyngeal swab specimens and  should not be used as a sole basis for treatment. Nasal washings and  aspirates are unacceptable for Xpert Xpress SARS-CoV-2/FLU/RSV  testing.  Fact Sheet for Patients: PinkCheek.be  Fact Sheet for Healthcare Providers: GravelBags.it  This test is not yet approved or cleared by the [place] FDA and  has been authorized for detection and/or diagnosis of SARS-CoV-2 by  FDA under an Emergency Use Authorization (EUA). This EUA will remain  in effect (meaning this  test can be used) for the duration of the  Covid-19 declaration under Section 564(b)(1) of the Act, 21  U.S.C. section 360bbb-3(b)(1), unless the authorization is  terminated or revoked. Performed at [facility], [place] [address], [place], [place] [address]   Blood culture (routine x  2)     Status: None (Preliminary result)   Collection Time: 04/28/20 11:38 PM   Specimen: BLOOD RIGHT FOREARM  Result Value Ref Range Status   Specimen Description BLOOD RIGHT FOREARM  Final   Special Requests   Final    BOTTLES DRAWN AEROBIC AND ANAEROBIC Blood Culture results may not be optimal due to an inadequate volume of blood received in culture bottles   Culture   Final    NO GROWTH 4 DAYS Performed at [facility], [place] [address] [place], [place] [address]    Report Status PENDING  Incomplete  Blood culture (routine x 2)     Status: None (Preliminary result)   Collection Time: 04/28/20 11:50 PM   Specimen: BLOOD LEFT HAND  Result Value Ref Range Status   Specimen Description BLOOD LEFT HAND  Final   Special Requests   Final    BOTTLES DRAWN AEROBIC AND ANAEROBIC Blood Culture results may not be optimal due to an inadequate volume of blood received in culture bottles   Culture   Final    NO GROWTH 4 DAYS Performed at [facility], [place] [address], [place],  [address]    Report Status PENDING  Incomplete    Impression/Plan:  1. MV endocarditis - blood cultures remain negative here so presumably new valve findings of endocarditis and I most suspect Enterococcus again with no other growth.   He will need 6 weeks of IV treatment with above   2.  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  I do think a TEE is important to get if possible to help determine any interventions that may be needed and prognosis overall.  GI to see today.    3.  aki - mild increase in creat and now resolved, wnl.

## 2020-05-03 NOTE — Transfer of Care (Signed)
Immediate Anesthesia Transfer of Care Note  Patient: Tim [name]  Procedure(s) Performed: INVASIVE LAB ABORTED CASE  Patient Location: PACU and Endoscopy Unit  Anesthesia Type:MAC  Level of Consciousness: awake and drowsy  Airway & Oxygen Therapy: Patient Spontanous Breathing and Patient connected to nasal cannula oxygen  Post-op Assessment: Report given to RN and Post -op Vital signs reviewed and stable  Post vital signs: Reviewed and stable  Last Vitals:  Vitals Value Taken Time  BP    Temp    Pulse    Resp    SpO2      Last Pain:  Vitals:   05/03/20 0800  TempSrc: Oral  PainSc: 0-No pain         Complications: No complications documented.

## 2020-05-04 ENCOUNTER — Inpatient Hospital Stay (HOSPITAL_COMMUNITY): Payer: Medicare Other | Admitting: Certified Registered Nurse Anesthetist

## 2020-05-04 ENCOUNTER — Encounter (HOSPITAL_COMMUNITY): Payer: Self-pay | Admitting: Internal Medicine

## 2020-05-04 ENCOUNTER — Encounter (HOSPITAL_COMMUNITY)
Admission: EM | Disposition: A | Discharge status: Home Health | Payer: Self-pay | Source: Home / Self Care | Attending: Internal Medicine

## 2020-05-04 DIAGNOSIS — R1319 Other dysphagia: Secondary | ICD-10-CM

## 2020-05-04 DIAGNOSIS — R7881 Bacteremia: Secondary | ICD-10-CM | POA: Diagnosis not present

## 2020-05-04 DIAGNOSIS — I330 Acute and subacute infective endocarditis: Secondary | ICD-10-CM

## 2020-05-04 DIAGNOSIS — K317 Polyp of stomach and duodenum: Secondary | ICD-10-CM

## 2020-05-04 DIAGNOSIS — A419 Sepsis, unspecified organism: Principal | ICD-10-CM

## 2020-05-04 DIAGNOSIS — Z952 Presence of prosthetic heart valve: Secondary | ICD-10-CM | POA: Diagnosis not present

## 2020-05-04 DIAGNOSIS — R1310 Dysphagia, unspecified: Secondary | ICD-10-CM

## 2020-05-04 DIAGNOSIS — J410 Simple chronic bronchitis: Secondary | ICD-10-CM

## 2020-05-04 HISTORY — PX: ESOPHAGEAL DILATION: SHX303

## 2020-05-04 HISTORY — PX: BIOPSY: SHX5522

## 2020-05-04 HISTORY — PX: ESOPHAGOGASTRODUODENOSCOPY (EGD) WITH PROPOFOL: SHX5813

## 2020-05-04 LAB — GLUCOSE, CAPILLARY
Glucose-Capillary: 122 mg/dL — ABNORMAL HIGH (ref 70–99)
Glucose-Capillary: 103 mg/dL — ABNORMAL HIGH (ref 70–99)
Glucose-Capillary: 120 mg/dL — ABNORMAL HIGH (ref 70–99)
Glucose-Capillary: 121 mg/dL — ABNORMAL HIGH (ref 70–99)

## 2020-05-04 LAB — COMPREHENSIVE METABOLIC PANEL
ALT: 23 U/L (ref 0–44)
AST: 26 U/L (ref 15–41)
Albumin: 2.8 g/dL — ABNORMAL LOW (ref 3.5–5.0)
Alkaline Phosphatase: 47 U/L (ref 38–126)
Anion gap: 10 (ref 5–15)
BUN: 11 mg/dL (ref 8–23)
CO2: 28 mmol/L (ref 22–32)
Calcium: 8.2 mg/dL — ABNORMAL LOW (ref 8.9–10.3)
Chloride: 103 mmol/L (ref 98–111)
Creatinine, Ser: 0.97 mg/dL (ref 0.61–1.24)
GFR, Estimated: >60 mL/min (ref >60)
Glucose, Bld: 115 mg/dL — ABNORMAL HIGH (ref 70–99)
Potassium: 3.2 mmol/L — ABNORMAL LOW (ref 3.5–5.1)
Sodium: 141 mmol/L (ref 135–145)
Total Bilirubin: 0.7 mg/dL (ref 0.3–1.2)
Total Protein: 5.6 g/dL — ABNORMAL LOW (ref 6.5–8.1)

## 2020-05-04 LAB — CBC WITH DIFFERENTIAL/PLATELET
Abs Immature Granulocytes: 0.03 10*3/uL (ref 0.00–0.07)
Basophils Absolute: 0 10*3/uL (ref 0.0–0.1)
Basophils Relative: 1 %
Eosinophils Absolute: 0.2 10*3/uL (ref 0.0–0.5)
Eosinophils Relative: 4 %
HCT: 30.8 % — ABNORMAL LOW (ref 39.0–52.0)
Hemoglobin: 10 g/dL — ABNORMAL LOW (ref 13.0–17.0)
Immature Granulocytes: 1 %
Lymphocytes Relative: 28 %
Lymphs Abs: 1.4 10*3/uL (ref 0.7–4.0)
MCH: 29.8 pg (ref 26.0–34.0)
MCHC: 32.5 g/dL (ref 30.0–36.0)
MCV: 91.7 fL (ref 80.0–100.0)
Monocytes Absolute: 0.6 10*3/uL (ref 0.1–1.0)
Monocytes Relative: 11 %
Neutro Abs: 2.8 10*3/uL (ref 1.7–7.7)
Neutrophils Relative %: 55 %
Platelets: 120 10*3/uL — ABNORMAL LOW (ref 150–400)
RBC: 3.36 MIL/uL — ABNORMAL LOW (ref 4.22–5.81)
RDW: 14.2 % (ref 11.5–15.5)
WBC: 5 10*3/uL (ref 4.0–10.5)
nRBC: 0 % (ref 0.0–0.2)

## 2020-05-04 LAB — CULTURE, BLOOD (ROUTINE X 2)
Culture: NO GROWTH
Culture: NO GROWTH

## 2020-05-04 LAB — PROCALCITONIN: Procalcitonin: <0.1 ng/mL

## 2020-05-04 LAB — MAGNESIUM: Magnesium: 1.6 mg/dL — ABNORMAL LOW (ref 1.7–2.4)

## 2020-05-04 LAB — BRAIN NATRIURETIC PEPTIDE: B Natriuretic Peptide: 64.1 pg/mL (ref 0.0–100.0)

## 2020-05-04 SURGERY — ESOPHAGOGASTRODUODENOSCOPY (EGD) WITH PROPOFOL
Anesthesia: Monitor Anesthesia Care

## 2020-05-04 MED ORDER — DEXMEDETOMIDINE (PRECEDEX) IN NS 20 MCG/5ML (4 MCG/ML) IV SYRINGE
PREFILLED_SYRINGE | INTRAVENOUS | Status: DC | PRN
  Administered 2020-05-04: 12 ug via INTRAVENOUS
  Administered 2020-05-04: 8 ug via INTRAVENOUS

## 2020-05-04 MED ORDER — MAGNESIUM SULFATE 2 GM/50ML IV SOLN
2.0000 g | Freq: Once | INTRAVENOUS | Status: AC
  Administered 2020-05-04: 2 g via INTRAVENOUS
  Filled 2020-05-04: qty 50

## 2020-05-04 MED ORDER — PROPOFOL 10 MG/ML IV BOLUS
INTRAVENOUS | Status: DC | PRN
  Administered 2020-05-04: 25 mg via INTRAVENOUS
  Administered 2020-05-04: 20 mg via INTRAVENOUS

## 2020-05-04 MED ORDER — LACTATED RINGERS IV SOLN: INTRAVENOUS | Status: DC | PRN

## 2020-05-04 MED ORDER — PROPOFOL 500 MG/50ML IV EMUL
INTRAVENOUS | Status: DC | PRN
  Administered 2020-05-04: 75 ug/kg/min via INTRAVENOUS

## 2020-05-04 MED ORDER — EPHEDRINE SULFATE-NACL 50-0.9 MG/10ML-% IV SOSY
PREFILLED_SYRINGE | INTRAVENOUS | Status: DC | PRN
  Administered 2020-05-04: 10 mg via INTRAVENOUS

## 2020-05-04 MED ORDER — APIXABAN 5 MG PO TABS
5.0000 mg | ORAL_TABLET | Freq: Two times a day (BID) | ORAL | Status: DC
  Administered 2020-05-05 – 2020-05-07 (×5): 5 mg via ORAL
  Filled 2020-05-04 (×5): qty 1

## 2020-05-04 SURGICAL SUPPLY — 15 items

## 2020-05-04 NOTE — Consult Note (Signed)
[place] Nurse wound follow up Patient receiving care in [place]. Spouse at bedside. Wound type: Small, quarter size, crusted wound to plantar surface of left foot, no drainage, no odor. Periwound: intact Dressing procedure/placement/frequency: bilateral profore wraps removed, legs cleansed, moisturized, then 4 layer bilateral profore wraps placed. Will see again Friday of this week if patient remains hospitalized. Val Riles, RN, MSN, CWOCN, CNS-BC, pager [phone]

## 2020-05-04 NOTE — Progress Notes (Signed)
Progress Note  Patient Name: Tim [name] Date of Encounter: 05/04/2020  [facility] Cardiologist: Kirk Ruths, MD   Subjective   Received EGD and dilation this AM.  Still feeling well.  Was happy to know his ERI was not for several years.  No dysphagia.  Inpatient Medications    Scheduled Meds: . allopurinol  100 mg Oral BID  . Chlorhexidine Gluconate Cloth  6 each Topical Daily  . ezetimibe  10 mg Oral Daily  . finasteride  5 mg Oral Daily  . fluticasone  1 spray Each Nare Daily  . gabapentin  800 mg Oral QID  . insulin aspart  0-9 Units Subcutaneous TID WC  . isosorbide mononitrate  30 mg Oral Daily  . magnesium oxide  400 mg Oral Daily  . metoprolol succinate  25 mg Oral Daily  . mometasone-formoterol  2 puff Inhalation BID  .  morphine injection  2 mg Intravenous Once  . pantoprazole  40 mg Oral Q1200  . rosuvastatin  40 mg Oral QHS  . sacubitril-valsartan  1 tablet Oral BID  . sodium chloride flush  10-40 mL Intracatheter Q12H  . spironolactone  12.5 mg Oral Daily  . torsemide  20 mg Oral Daily   Continuous Infusions: . sodium chloride 20 mL/hr at 05/03/20 1615  . ampicillin (OMNIPEN) IV Stopped (05/04/20 0419)  . cefTRIAXone (ROCEPHIN)  IV Stopped (05/03/20 2236)   PRN Meds: albuterol, nitroGLYCERIN, sodium chloride, sodium chloride flush   Vital Signs    Vitals:   05/03/20 2038 05/04/20 0024 05/04/20 0404 05/04/20 0622  BP:  (!) 103/32 (!) 108/51   Pulse:  73 61   Resp:  17 15   Temp:  97.7 F (36.5 C) 97.8 F (36.6 C)   TempSrc:  Axillary Axillary   SpO2: 95% 94% 93%   Weight:    (!) 146 kg  Height:        Intake/Output Summary (Last 24 hours) at 05/04/2020 0749 Last data filed at 05/04/2020 1497 Gross per 24 hour  Intake 1593 ml  Output 1975 ml  Net -382 ml   Last 3 Weights 05/04/2020 05/03/2020 05/03/2020  Weight (lbs) 321 lb 14 oz 327 lb 11.2 oz 327 lb 11.2 oz  Weight (kg) 146 kg 148.644 kg 148.644 kg      Telemetry    A paced  V paced; presently not returned to telemetry (post EGD) - Personally Reviewed  ECG    No new tracings - Personally Reviewed  Physical Exam   GEN: No acute distress, Obese Male Neck: No JVD Cardiac: RRR, II/VI Systolic crescendo rubs, or gallops.  Respiratory: Clear to auscultation bilaterally. GI: Soft, nontender, non-distended  MS: No edema; No deformity. Neuro:  Nonfocal  Psych: Normal affect   Labs    High Sensitivity Troponin:   Recent Labs  Lab 04/28/20 2118 04/28/20 2336 04/29/20 0013 04/29/20 0759 05/01/20 1748  TROPONINIHS 11 16 15 15 8       Chemistry Recent Labs  Lab 05/02/20 0241 05/03/20 0350 05/04/20 0357  NA 142 141 141  K 3.5 3.8 3.2*  CL 103 105 103  CO2 29 27 28   GLUCOSE 123* 135* 115*  BUN 18 12 11   CREATININE 1.15 0.90 0.97  CALCIUM 8.5* 8.7* 8.2*  PROT 5.6* 6.3* 5.6*  ALBUMIN 2.8* 3.1* 2.8*  AST 25 30 26   ALT 21 25 23   ALKPHOS 43 52 47  BILITOT 0.7 0.6 0.7  GFRNONAA >60 >60 >60  ANIONGAP 10  9 10     Hematology Recent Labs  Lab 05/02/20 0241 05/03/20 0350 05/04/20 0357  WBC 5.6 6.3 5.0  RBC 3.20* 3.63* 3.36*  HGB 9.3* 10.7* 10.0*  HCT 28.9* 33.3* 30.8*  MCV 90.3 91.7 91.7  MCH 29.1 29.5 29.8  MCHC 32.2 32.1 32.5  RDW 14.5 14.5 14.2  PLT 104* 130* 120*    BNP Recent Labs  Lab 05/02/20 0241 05/03/20 0350 05/04/20 0357  BNP 70.3 105.3* 64.1     DDimer  Recent Labs  Lab 05/01/20 0319 05/02/20 0241 05/03/20 0350  DDIMER 0.79* 0.67* 1.30*     Radiology    No results found.  Cardiac Studies   TEE 05/03/20 attempted - could not be completed due to obstruction encountered when passing the probe   Echo 04/29/20: 1. Left ventricular ejection fraction, by estimation, is 55 to 60%. The  left ventricle has normal function. The left ventricle has no regional  wall motion abnormalities. Left ventricular diastolic parameters are  consistent with Grade I diastolic  dysfunction (impaired relaxation).  2. Right  ventricular systolic function is normal. The right ventricular  size is mildly enlarged. Tricuspid regurgitation signal is inadequate for  assessing PA pressure.  3. Left atrial size was mildly dilated.  4. The mitral valve is degenerative. No evidence of mitral valve  regurgitation. Mild mitral stenosis. The mean mitral valve gradient is 5.0  mmHg. Moderate mitral annular calcification. There is a 1.3 x 1 cm mobile  vegetation attached to the anterior  mitral leaflet.  5. The aortic valve was not well visualized. Aortic valve regurgitation  is not visualized. Mild aortic valve stenosis. Aortic valve mean gradient  measures 16.0 mmHg.  6. The inferior vena cava is normal in size with greater than 50%  respiratory variability, suggesting right atrial pressure of 3 mmHg.  7. Technically difficult study with very poor windows. Possible mitral  valve vegetation. The tricuspid valve, aortic valve, and ICD were poorly  visualized. Suggest TEE.   Patient Profile     79 y.o. 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 ([employer]) involvement Complete Heart Block- Pacemaker Dependent Known PFO Prior osteomyelitis - TEE 12/02/19 with vegetation on TAVR valve and on RV pacer  lead - TTE this admission with poor windows - attempted TEE, but unable to pass probe 05/03/20 due to possible obstruction - GI consulted, s/p EGD - pt has NGTD on blood cultures - Pt does not wish to undergo sternotomy, too high risk for lead exraction - ICD interrogated yesterday with ERI 3.5 years, pacer dependent as underlying rhythm is CHB - case discussed with patient - unclear utility for TEE since patient is not a candidate for, nor wants to undergo, sternotomy or laser lead extraction  Ischemic cardiomyopathy BiV ICD in place Chronic systolic  and diastolic heart failure DM HTN - interrogation with optivol showing increased impedence - pt states breathing is at baseline - continue low dose entresto - BNP 70 --> 105 -    Paroxysmal atrial fibrillation Chronic anticoagulation This patients CHA2DS2-VASc Score and unadjusted Ischemic Stroke Rate (% per year) is equal to 9.7 % stroke rate/year from a score of 6 (2age, HTN, DM, CHF, CAD) - continue eliquis and BB        For questions or updates, please contact [facility] Please consult [email or web address] for contact info under        Signed, Ledora Bottcher, PA  05/04/2020, 7:49 AM    Personally seen and examined. Agree with APP above with the following comments: 6 M with HFrEF s/p BiV and CHB, Severe AS s/p TAVR with prosthetic endocarditis /sp medical therapy and life long antibiotics No dysphagia post EGD s/p Dilation Persistent questions surrounding infective endocarditis. Persistent systolic murmur. Discussed with IM, GI, and ID;  Will attempt repeat EGD.  If failed a second time; would empirically treat with antibiotics for 6 weeks. Patient amenable. Low threshold to start low dose PO lasix (20 mg PO daily).  Werner Lean, MD

## 2020-05-04 NOTE — Op Note (Signed)
[facility] Patient Name: Tim [name] Procedure Date : 05/04/2020 MRN: [id number] Attending MD: Jackquline Denmark , MD Date of Birth: 1941/03/09 CSN: [id number] Age: 79 Admit Type: Inpatient Procedure:                Upper GI endoscopy Indications:              Unable to pass TEE scope Providers:                Jackquline Denmark, MD, Wynonia Sours, RN, Cherylynn Ridges,                            Technician, Imagene Riches CRNA Referring MD:              Medicines:                Monitored Anesthesia Care Complications:            No immediate complications. Estimated Blood Loss:     Estimated blood loss: none. Procedure:                Pre-Anesthesia Assessment:                           - Prior to the procedure, a History and Physical                            was performed, and patient medications and                            allergies were reviewed. The patient's tolerance of                            previous anesthesia was also reviewed. The risks                            and benefits of the procedure and the sedation                            options and risks were discussed with the patient.                            All questions were answered, and informed consent                            was obtained. Prior Anticoagulants: The patient has                            taken Eliquis (apixaban), last dose was 1 day prior                            to procedure. ASA Grade Assessment: III - A patient                            with severe systemic disease. After reviewing the  risks and benefits, the patient was deemed in                            satisfactory condition to undergo the procedure.                           After obtaining informed consent, the endoscope was                            passed under direct vision. Throughout the                            procedure, the patient's blood pressure, pulse, and                             oxygen saturations were monitored continuously. The                            GIF-H190 ([id number]) Olympus gastroscope was                            introduced through the and advanced to the second                            part of duodenum. The upper GI endoscopy was                            accomplished without difficulty. The patient                            tolerated the procedure well. Scope In: Scope Out: Findings:      The esophagus was normal. It was decided, however, to proceed with       dilation of the entire esophagus. The scope was withdrawn. Dilation was       performed with a Maloney dilator with mild resistance at 50 Fr.      The Z-line was regular and was found 40 cm from the incisors.      Multiple (10-15) 4 to 6 mm semi-sessile polyps with no bleeding and no       stigmata of recent bleeding were found in the gastric fundus and in the       gastric body. 3 polyps were removed with a cold biopsy forceps.       Resection and retrieval were complete. Estimated blood loss: none.      The examined duodenum was normal.      The exam was otherwise without abnormality. Impression:               - Multiple gastric polyps. (Resected and retrieved                            x 3).                           - S/P Empiric esopageal dilatation Recommendation:           - Return patient to hospital  ward for ongoing care.                           - Resume Eliquis (apixaban) at prior dose tomorrow.                           - If any further problems in passing TEE scope, pl                            let us know in real time. One of [name] from the endo                            unit would be able to help.                           - Continue present medications.                           - Await pathology results.                           - The findings and recommendations were discussed                            with the patient.                           - Return to GI  clinic PRN. Procedure Code(s):        --- Professional ---                           [phone], Esophagogastroduodenoscopy, flexible,                            transoral; with biopsy, single or multiple                           43450, Dilation of esophagus, by unguided sound or                            bougie, single or multiple passes Diagnosis Code(s):        --- Professional ---                           R13.10, Dysphagia, unspecified                           K31.7, Polyp of stomach and duodenum CPT copyright 2019 American Medical Association. All rights reserved. The codes documented in this report are preliminary and upon coder review may  be revised to meet current compliance requirements. Jackquline Denmark, MD 05/04/2020 10:21:39 AM This report has been signed electronically. Number of Addenda: 0

## 2020-05-04 NOTE — Progress Notes (Signed)
[facility] for apixaban Indication: atrial fibrillation  Allergies  Allergen Reactions  . Brimonidine Tartrate Shortness Of Breath    Alphagan-Shortness of breath  . Brinzolamide Shortness Of Breath    AZOPT- Shortness of breath  . Latanoprost Shortness Of Breath    XALATAN- Shortness of breath  . Nucynta [Tapentadol] Shortness Of Breath  . Sulfa Antibiotics Palpitations  . Timolol Maleate Shortness Of Breath and Other (See Comments)    TIMOPTIC- Aggravated asthma  . Diltiazem Swelling     leg swelling  . Rofecoxib Swelling     VIOXX- leg swelling  . Vancomycin Hives and Other (See Comments)    Possible "Red Man Syndrome"? > hives/blisters  . Codeine Other (See Comments)    Childhood reaction  . Tamsulosin Other (See Comments)    Dizziness   . Celecoxib Other (See Comments)    CELLBREX-confusion  . Colchicine Diarrhea    diarrhea  . Tape Rash    Patient Measurements: Height: 6' (182.9 cm) Weight: (!) 146 kg (321 lb 14 oz) IBW/kg (Calculated) : 77.6  Vital Signs: Temp: 97.5 F (36.4 C) (11/02 1113) Temp Source: Oral (11/02 1113) BP: 127/60 (11/02 1113) Pulse Rate: 95 (11/02 1113)  Labs: Recent Labs    05/01/20 1748 05/02/20 0241 05/02/20 0241 05/03/20 0350 05/04/20 0357  HGB  --  9.3*   < > 10.7* 10.0*  HCT  --  28.9*  --  33.3* 30.8*  PLT  --  104*  --  130* 120*  CREATININE  --  1.15  --  0.90 0.97  TROPONINIHS 8  --   --   --   --    < > = values in this interval not displayed.    Estimated Creatinine Clearance: 90.2 mL/min (by C-G formula based on SCr of 0.97 mg/dL).   Medical History: Past Medical History:  Diagnosis Date  . AICD (automatic cardioverter/defibrillator) present   . ASTHMA   . Asthma    as a child  . Balanitis    +severe phimosis+ buried penis->circ not possible but dorsal slit done 10/2019  . BPH (benign prostatic hypertrophy)    with urinary retention.  Renal u/s 12/04/19 NORMAL KIDNEYS,  NORMAL BLADDER, NO HYDRONEPHROSIS  . CAD (coronary artery disease)    Nonobstructive by 12/2012 cath; then 03/2016 he required BMS to RCA ([facility]).  In-stent restenosis on cath 11/01/16, baloon angioplasty successful--pt to take plavix (no ASA), + eliquis (for PAF).  [name] CATARACT, HX OF   . Chronic combined systolic and diastolic CHF (congestive heart failure) ([place]) 05/2016   Ischemic CM.  04/2018 EF 40-45%, grd I DD.  [name] Chronic pain syndrome    Lumbar DDD; chronic neuropathic pain (DM); has spinal stimulator and sees pain mgmt MD  . Complicated UTI (urinary tract infection) 09/2019   Phimoses, acute urinary retention, entoerococcus UTI, enterococc bacteremia.  F/u blood clx's neg x 5d.    [name] COPD   . Diabetes mellitus type 2 with complications (HCC)    ZHG9J jumped from 5.7% to 6.1% 03/2015---started metformin at that time.  DM 2 dx by fasting gluc criteria 2018.  Has chronic neuropath pain  . Elevated transaminase level 02/2016   Suspect due to fatty liver (documented on u/s 2007). Plan repeat labs 03/2016.  [name] Enterococcal bacteremia 09/2019   Phimoses, acute urinary retention, entoerococcus UTI, enterococc bacteremia.  F/u blood clx's neg x 5d.    . Essential hypertension, benign   . Fatty liver  2007   2007 u/s showed fatty liver with hepatosplenomegaly.  2019 repeat u/s->fatty liver but no cirrhosis or hepatosplenomegaly.  . FUO (fever of unknown origin) 11/26/2019  . GERD (gastroesophageal reflux disease)   . Glaucoma   . GOUT   . Hallux valgus (acquired)   . HH (hiatus hernia)   . HYPERCHOLESTEROLEMIA-PURE   . Hypogonadism male   . ICD (implantable cardioverter-defibrillator) infection ([place]) 12/22/2019  . 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 Tommy Medal)  . Lumbosacral neuritis   . Lumbosacral spondylosis    Lumbar spinal stenosis with neurogenic claudication--contributes to his chronic pain  syndrome  . Normal memory function 08/2014   Neuropsychological testing ([facility]): no cognitive impairment or sign of neurodegenerative disorder.  Likely has adjustment d/o with mixed anxiety/depressed features and may benefit from low dose SNRI.    [name] Normocytic anemia 03/2016   Mild-pt needs ferritin and vit B12 level checked (as of 03/22/16). Hb stable 09/2019  . NSTEMI (non-ST elevated myocardial infarction) ([place]) 03/20/2016   BMS to RCA  . Obesity hypoventilation syndrome ([place])   . Obesity, unspecified   . Orthostatic hypotension   . OSA on CPAP    8 cm H2O  . OSTEOARTHRITIS   . Paroxysmal atrial fibrillation ([place]) 2003    (? chronic?) Off anticoag for a while due to falls.  Then apixaban started 12/2014.  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 Ella Bodo): neuropathic pain->diff to treat, failed nucynta, failed spinal stimul trial, oxycontin hs + tramadol + gabap as of 12/2017 f/u Dr. Letta Pate.  . Personal history of colonic adenoma 10/30/2012   Diminutive adenoma, consider repeat 2019 per GI  . PFO (patent foramen ovale) 09/2019   small, with predominately L to R shunt  . Pneumonia   . Presence of cardiac defibrillator 11/07/2017  . Primary osteoarthritis of both knees    Bone on bone of medial compartments, + signif patellofemoral arth bilat.--supartz inj series started 09/12/17  . 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 PUD (peptic ulcer disease)   . PULMONARY HYPERTENSION, HX OF   . Secondary male hypogonadism 2017  . Sepsis ([place]) 04/29/2020  . 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 Shortness of breath    with exertion: much improved s/p TAVR and treatment for CHF.  . Sick sinus syndrome (HCC)    PPM placed  . Thrombocytopenia ([place]) 2018   HSM on 2007 abd u/s---suspect some mild splenic sequestration chronically.  [name] Unspecified glaucoma(365.9)    . Unspecified hereditary and idiopathic peripheral neuropathy approx age 26   bilat LE's, ? left arm, too.  Feet became progressively numb + left foot pain intermittently.  Pt may be trying a spinal stimulator (as of 05/2015)  . VENOUS INSUFFICIENCY    Being followed by Dr. Sharol Given as of 10/2016 for two R LL venous stasis ulcers/skin tears.  Healed as of 10/30/16 f/u with Dr. Sharol Given.  . VENTRAL HERNIA   . Wound dehiscence 10/22/2019    Medications:  Scheduled:  . allopurinol  100 mg Oral BID  . Chlorhexidine Gluconate Cloth  6 each Topical Daily  . ezetimibe  10 mg Oral Daily  . finasteride  5 mg Oral Daily  . fluticasone  1 spray Each Nare Daily  . gabapentin  800 mg Oral  QID  . insulin aspart  0-9 Units Subcutaneous TID WC  . isosorbide mononitrate  30 mg Oral Daily  . magnesium oxide  400 mg Oral Daily  . metoprolol succinate  25 mg Oral Daily  . mometasone-formoterol  2 puff Inhalation BID  .  morphine injection  2 mg Intravenous Once  . pantoprazole  40 mg Oral Q1200  . rosuvastatin  40 mg Oral QHS  . sacubitril-valsartan  1 tablet Oral BID  . sodium chloride flush  10-40 mL Intracatheter Q12H  . spironolactone  12.5 mg Oral Daily  . torsemide  20 mg Oral Daily    Assessment: 22 you male on apixaban PTA for afib. Apixaban was on hold for endoscopy 11/2 w/ multiple gastric polyps (resected and retrieved) and s/p esophageal dilation. Pharmacy to restart apixaban on 11/3 -SCr= 0.9, hg=10  Goal of Therapy:  Monitor platelets by anticoagulation protocol: Yes   Plan:  -restart apixaban 62m po bid on 11/3  -No adjustments in dosing anticipated  Will sign off. Please contact pharmacy with any other needs.  Thank you  AHildred Laser PharmD Clinical Pharmacist **Pharmacist phone directory can now be found on [email or web address] (PW [email or web address]).  Listed under [place]

## 2020-05-04 NOTE — Anesthesia Preprocedure Evaluation (Signed)
Anesthesia Evaluation  Patient identified by MRN, date of birth, ID band Patient awake    Reviewed: Allergy & Precautions, H&P , NPO status , Patient's Chart, lab work & pertinent test results, reviewed documented beta blocker date and time   History of Anesthesia Complications Negative for: history of anesthetic complications  Airway Mallampati: II  TM Distance: >3 FB Neck ROM: Full    Dental no notable dental hx.    Pulmonary asthma , sleep apnea and Continuous Positive Airway Pressure Ventilation , COPD,  COPD inhaler,    Pulmonary exam normal        Cardiovascular hypertension, Pt. on medications and Pt. on home beta blockers + CAD, + Past MI, + Cardiac Stents (2017 on Eliquis) and +CHF  Normal cardiovascular exam+ dysrhythmias Atrial Fibrillation + pacemaker + Cardiac Defibrillator   S/P TAVR 2017    Neuro/Psych  Neuromuscular disease (peripheral neuropathy) negative psych ROS   GI/Hepatic Neg liver ROS, hiatal hernia, PUD, GERD  Medicated and Controlled,  Endo/Other  diabetes, Type 2Morbid obesity  Renal/GU negative Renal ROS  negative genitourinary   Musculoskeletal  (+) Arthritis ,   Abdominal   Peds  Hematology  (+) Blood dyscrasia, anemia ,   Anesthesia Other Findings Bacteremia  Reproductive/Obstetrics negative OB ROS                             Anesthesia Physical  Anesthesia Plan  ASA: III  Anesthesia Plan: MAC   Post-op Pain Management:    Induction:   PONV Risk Score and Plan: 1 and Treatment may vary due to age or medical condition and Propofol infusion  Airway Management Planned: Natural Airway and Nasal Cannula  Additional Equipment:   Intra-op Plan:   Post-operative Plan:   Informed Consent: I have reviewed the patients History and Physical, chart, labs and discussed the procedure including the risks, benefits and alternatives for the proposed anesthesia  with the patient or authorized representative who has indicated his/her understanding and acceptance.   Patient has DNR.  Discussed DNR with patient.     Plan Discussed with: CRNA and Anesthesiologist  Anesthesia Plan Comments:         Anesthesia Quick Evaluation

## 2020-05-04 NOTE — Anesthesia Procedure Notes (Signed)
Procedure Name: MAC Date/Time: 05/04/2020 10:00 AM Performed by: Imagene Riches, CRNA Pre-anesthesia Checklist: Patient identified, Emergency Drugs available, Suction available, Patient being monitored and Timeout performed Patient Re-evaluated:Patient Re-evaluated prior to induction Oxygen Delivery Method: Nasal cannula

## 2020-05-04 NOTE — Consult Note (Addendum)
[facility] Consult: 8:53 AM 05/04/2020  LOS: 5 days    Referring Provider: Dr. Candiss Norse Primary Care Physician:  Tammi Sou, MD Primary Gastroenterologist: [facility] remotely.  Dr. Arelia Longest.    Reason for Consultation: Cardiologist unable to pass TEE scope.   HPI: Tim [name] is a 79 y.o. male.  PMH super morbid obesity.  IDDM.  CHF.  OSA on CPAP.  Pulmonary hypertension.  Venous stasis.  A fib on chonic AC.  COPD.  S/P TAVR.  S/p ICD.  On chronic antibiotic suppression w amoxicillin for history prosthetic aortic valve endocarditis.   Enterococcal bacteremia in March 2021.  Infection of amputation site of left third toe in 10/2019 and ended up having a transmetatarsal amputation.  11/2019 bacteremia with Enterococcus faecalis.  TEE then showed prostatic valve endocarditis, ICD also infected.  Treated with 6 weeks of additional antibiotics then transition to lifelong suppression with 3 times daily amoxicillin.  Developed fever but otherwise felt well and presented to the ED 04/28/2020 mild CHF on chest film.  Superficial ulcers at the site of the metatarsal amputation but CT did not show osteomyelitis or SQ emphysema.  Yesterday, 05/03/2020, cardiology attempted TEE.  Case aborted when cardiologist unable to pass probe.  Sought GI consultation and possible EGD.    12/2008 EGD for odynophagia revealed Candida esophagitis.  Other GI history includes hemorrhoidectomy 2007.  Diarrhea 2014/2015.  Colonoscopy 2014 with diminutive adenoma, diverticulosis unrevealing as to cause for diarrhea.  Dr. Carlean Purl suspected IBS.  In 10/2017 Dr. Carlean Purl contacted patient saying follow-up colonoscopy was not recommended.  CTAP of 09/2019 showed simple sigmoid diverticulosis, aortic atherosclerosis, liver/pancreas/biliary and  pancreatic ducts/stomach normal, s/p cholecystectomy.  Patient does not take NSAIDs, does take Eliquis and low-dose aspirin.  Last dose of Eliquis was the morning of 11/1.  Denies symptoms of acid reflux, takes pantoprazole daily.  No change in bowel habits.  No passing of bloody stool.  Social history.  Retired from [employer].  Non-smoker, nondrinker. Family history pertinent for diverticulosis in his mother but no colon cancer or polyps.  No ulcer disease, anemia or GI bleeding.  Past Medical History:  Diagnosis Date  . AICD (automatic cardioverter/defibrillator) present   . ASTHMA   . Asthma    as a child  . Balanitis    +severe phimosis+ buried penis->circ not possible but dorsal slit done 10/2019  . BPH (benign prostatic hypertrophy)    with urinary retention.  Renal u/s 12/04/19 NORMAL KIDNEYS, NORMAL BLADDER, NO HYDRONEPHROSIS  . CAD (coronary artery disease)    Nonobstructive by 12/2012 cath; then 03/2016 he required BMS to RCA ([facility]).  In-stent restenosis on cath 11/01/16, baloon angioplasty successful--pt to take plavix (no ASA), + eliquis (for PAF).  [name] CATARACT, HX OF   . Chronic combined systolic and diastolic CHF (congestive heart failure) ([place]) 05/2016   Ischemic CM.  04/2018 EF 40-45%, grd I DD.  [name] Chronic pain syndrome    Lumbar DDD; chronic neuropathic pain (DM); has spinal stimulator and sees pain mgmt MD  . Complicated  UTI (urinary tract infection) 09/2019   Phimoses, acute urinary retention, entoerococcus UTI, enterococc bacteremia.  F/u blood clx's neg x 5d.    [name] COPD   . Diabetes mellitus type 2 with complications (HCC)    ZHY8M jumped from 5.7% to 6.1% 03/2015---started metformin at that time.  DM 2 dx by fasting gluc criteria 2018.  Has chronic neuropath pain  . Elevated transaminase level 02/2016   Suspect due to fatty liver (documented on u/s 2007). Plan repeat labs 03/2016.  [name] Enterococcal bacteremia 09/2019   Phimoses, acute urinary retention, entoerococcus UTI,  enterococc bacteremia.  F/u blood clx's neg x 5d.    . Essential hypertension, benign   . Fatty liver 2007   2007 u/s showed fatty liver with hepatosplenomegaly.  2019 repeat u/s->fatty liver but no cirrhosis or hepatosplenomegaly.  . FUO (fever of unknown origin) 11/26/2019  . GERD (gastroesophageal reflux disease)   . Glaucoma   . GOUT   . Hallux valgus (acquired)   . HH (hiatus hernia)   . HYPERCHOLESTEROLEMIA-PURE   . Hypogonadism male   . ICD (implantable cardioverter-defibrillator) infection ([place]) 12/22/2019  . 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 Tommy Medal)  . Lumbosacral neuritis   . Lumbosacral spondylosis    Lumbar spinal stenosis with neurogenic claudication--contributes to his chronic pain syndrome  . Normal memory function 08/2014   Neuropsychological testing ([facility]): no cognitive impairment or sign of neurodegenerative disorder.  Likely has adjustment d/o with mixed anxiety/depressed features and may benefit from low dose SNRI.    [name] Normocytic anemia 03/2016   Mild-pt needs ferritin and vit B12 level checked (as of 03/22/16). Hb stable 09/2019  . NSTEMI (non-ST elevated myocardial infarction) ([place]) 03/20/2016   BMS to RCA  . Obesity hypoventilation syndrome ([place])   . Obesity, unspecified   . Orthostatic hypotension   . OSA on CPAP    8 cm H2O  . OSTEOARTHRITIS   . Paroxysmal atrial fibrillation ([place]) 2003    (? chronic?) Off anticoag for a while due to falls.  Then apixaban started 12/2014.  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 Ella Bodo): neuropathic pain->diff to treat, failed nucynta, failed spinal stimul trial, oxycontin hs + tramadol + gabap as of 12/2017 f/u Dr. Letta Pate.  . Personal history of colonic adenoma 10/30/2012   Diminutive adenoma, consider repeat 2019 per GI  . PFO (patent foramen ovale) 09/2019    small, with predominately L to R shunt  . Pneumonia   . Presence of cardiac defibrillator 11/07/2017  . Primary osteoarthritis of both knees    Bone on bone of medial compartments, + signif patellofemoral arth bilat.--supartz inj series started 09/12/17  . 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 PUD (peptic ulcer disease)   . PULMONARY HYPERTENSION, HX OF   . Secondary male hypogonadism 2017  . Sepsis ([place]) 04/29/2020  . 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 Shortness of breath    with exertion: much improved s/p TAVR and treatment for CHF.  . Sick sinus syndrome (HCC)    PPM placed  . Thrombocytopenia ([place]) 2018   HSM on 2007 abd u/s---suspect some mild splenic sequestration chronically.  [name] Unspecified glaucoma(365.9)   . Unspecified hereditary and idiopathic peripheral neuropathy approx age 15   bilat LE's, ? left arm,  too.  Feet became progressively numb + left foot pain intermittently.  Pt may be trying a spinal stimulator (as of 05/2015)  . VENOUS INSUFFICIENCY    Being followed by Dr. Sharol Given as of 10/2016 for two R LL venous stasis ulcers/skin tears.  Healed as of 10/30/16 f/u with Dr. Sharol Given.  . VENTRAL HERNIA   . Wound dehiscence 10/22/2019    Past Surgical History:  Procedure Laterality Date  . AMPUTATION Left 04/11/2013   Procedure: AMPUTATION DIGIT Left 3rd toe;  Surgeon: [name], MD;  Location: [place];  Service: Orthopedics;  Laterality: Left;  Left 3rd toe amputation at [facility]  . AMPUTATION Left 08/29/2019   Procedure: LEFT TRANSMETATARSAL AMPUTATION;  Surgeon: [name], MD;  Location: [place];  Service: Orthopedics;  Laterality: Left;  . CARDIAC CATHETERIZATION  1997; 03/10/16   1997 Non-obstructive disease.  03/2016 BMS to RCA, with 25% pDiag dz, o/w normal cors per cath 03/07/16.  Cath 11/01/16: in stent restenosis, successful baloon angioplasty.  [name] CARDIAC CATHETERIZATION  12/24/2012   mild < 20%  LCx, prox 30% RCA; LVEF 55-65% , moderate pulmonary HTN, moderate AS  . CARDIAC DEFIBRILLATOR PLACEMENT  11/07/2017   [facility] MRI Quad CRT defibrillator  . CARDIOVASCULAR STRESS TEST  05/11/16 ([facility])   Myocardial perfusion imaging:  No ischemia; scar in apex, global hypokinesis, EF 36%.  . Carotid dopplers  03/09/2016   [facility]: no hemodynamically significant stenosis on either side.  . CHOLECYSTECTOMY    . COLONOSCOPY    . COLONOSCOPY [name] 10/30/2012   Procedure: COLONOSCOPY;  Surgeon: [name], MD;  Location: [facility];  Service: Endoscopy;  Laterality: [name];  . CORONARY ANGIOPLASTY WITH STENT PLACEMENT  03/2016; 04/2017   2017-[facility]: BMS to RCA-pt was placed on Brilinta.  04/2017: DES to RCA.  . DORSAL SLIT [name] 10/29/2019   for severe phimosis. Procedure: DORSAL SLIT;  Surgeon: Alexis Frock, MD;  Location: [facility];  Service: Urology;  Laterality: [name];  78 MINS  . EYE SURGERY Bilateral cataract  . HEMORRHOID SURGERY    . INTRAOCULAR LENS INSERTION Bilateral   . KNEE SURGERY Right   . LEFT AND RIGHT HEART CATHETERIZATION WITH CORONARY ANGIOGRAM [name] 12/24/2012   Procedure: LEFT AND RIGHT HEART CATHETERIZATION WITH CORONARY ANGIOGRAM;  Surgeon: [name], MD;  Location: [name] CATH LAB;  Service: Cardiovascular;  Laterality: [name];  . LEG SURGERY Bilateral    lenghtening   . PACEMAKER PLACEMENT  04/13/2016   2nd deg HB after TAVR, pt had DC MDT PPM placed.  [name] SHOULDER ARTHROSCOPY  08/30/2011   Procedure: ARTHROSCOPY SHOULDER;  Surgeon: [name], MD;  Location: [place];  Service: Orthopedics;  Laterality: Right;  Right Shoulder Arthroscopy, Debridement, and Decompression  . SPINAL CORD STIMULATOR INSERTION [name] 09/10/2015   Procedure: LUMBAR SPINAL CORD STIMULATOR INSERTION;  Surgeon: [name], MD;  Location: [place] [facility];  Service: Neurosurgery;  Laterality: [name];  . TEE WITHOUT CARDIOVERSION [name] 09/16/2019   Procedure: TRANSESOPHAGEAL ECHOCARDIOGRAM (TEE);  Surgeon: Skeet Latch, MD;  Location: [facility];  Service: Cardiovascular;  Laterality: [name];  . TEE WITHOUT CARDIOVERSION [name] 12/02/2019   +vegetation on AVR and pacer lead in RV.  EF 55-60%, normal wall motion.  Valves function normal.  Procedure: TRANSESOPHAGEAL ECHOCARDIOGRAM (TEE);  Surgeon: Geralynn Rile, MD;  Location: [place];  Service: Cardiovascular;  Laterality: [name];  . 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  R big toe surg due to osteoarth  .  TONSILLECTOMY    . traeculectomy Left    eye  . TRANSCATHETER AORTIC VALVE REPLACEMENT, TRANSFEMORAL  04/11/2016  . TRANSESOPHAGEAL ECHOCARDIOGRAM  03/09/2016; 09/2019   [facility]: EF 55-60%, PFO seen with bi-directional shunting, no thrombus in appendage.  09/2019 ->no valvular vegetations. Small patent foramen ovale with predominantly left to right shunting across the interatrial septum.  . TRANSTHORACIC ECHOCARDIOGRAM  01/2015; 01/2016; 05/18/16; 09/18/16, 05/2017, 08/2017   01/2015 No signif change in aortic stenosis (moderate).  01/2016 Severe LVH w/small LV cavity, EF 60-65%, grade I diast dysfxn.  05/2016 (s/p TAVR): EF 50-55%, grd I DD, biopros AV good.  08/2016--EF 50-55%, LV septal motion c/w conduction abnl, grd I DD,mild MS,bioprosth aortic valve well seated, w/trace AR. 05/2017 TTE EF 35%. 08/2017-EF 35%, mod diff hypokin LV, grd I DD, biopros AV good.   . TRANSTHORACIC ECHOCARDIOGRAM  04/2018; 09/2019   04/2018: EF 40-45%, mod diffuse LV hypokinesis, grd I DD, bioprosth AV well seated, no AS or AR. 09/2019 EF 60-65%, grd I DD, valves fine, including bioprosth AV  . VITRECTOMY      Prior to Admission medications   Medication Sig Start Date End Date Taking? Authorizing Provider  albuterol (PROVENTIL HFA;VENTOLIN HFA) 108 (90 BASE) MCG/ACT inhaler Inhale 2 puffs into the lungs 4 (four) times daily as needed for wheezing or shortness of breath.   Yes [provider]  allopurinol (ZYLOPRIM) 300 MG tablet TAKE 1 TABLET BY MOUTH ONCE DAILY  WITH FOOD 12/09/19  Yes McGowen, Adrian Blackwater, MD  amoxicillin (AMOXIL) 500 MG capsule Take 1 capsule (500 mg total) by mouth 3 (three) times daily. 12/22/19  Yes Tommy Medal, [name], MD  apixaban (ELIQUIS) 5 MG TABS tablet Take 5 mg by mouth 2 (two) times daily.   Yes [provider]  ASPIRIN LOW DOSE 81 MG EC tablet Take 81 mg by mouth daily. 02/03/19  Yes [provider]  B Complex-C (B-COMPLEX WITH VITAMIN C) tablet Take 1 tablet by mouth daily. Take 1 tablet daily, 240m   Yes [provider]  ezetimibe (ZETIA) 10 MG tablet Take 1 tablet (10 mg total) by mouth daily. 12/16/19  Yes McGowen, PAdrian Blackwater MD  finasteride (PROSCAR) 5 MG tablet Take 1 tablet (5 mg total) by mouth daily. 12/10/19  Yes McGowen, PAdrian Blackwater MD  fluticasone (FLONASE) 50 MCG/ACT nasal spray Place 2 sprays into both nostrils as needed for allergies or rhinitis.   Yes [provider]  gabapentin (NEURONTIN) 800 MG tablet TAKE 1 TABLET (800 MG TOTAL) BY MOUTH 4 (FOUR) TIMES DAILY. 01/14/20  Yes Kirsteins, ALuanna Salk MD  KLOR-CON M20 20 MEQ tablet Take 20 mEq by mouth daily. 03/07/20  Yes [provider]  metoprolol succinate (TOPROL-XL) 25 MG 24 hr tablet Take 25 mg by mouth daily. 12/17/19  Yes [provider]  Multiple Vitamin (MULTIVITAMIN ADULT PO) Take by mouth daily.   Yes [provider]  niacin (NIASPAN) 1000 MG CR tablet TAKE 1 TABLET BY MOUTH TWICE A DAY 04/26/20  Yes McGowen, PAdrian Blackwater MD  nitroGLYCERIN (NITROSTAT) 0.4 MG SL tablet Place 0.4 mg under the tongue every 5 (five) minutes as needed for chest pain.  09/04/17  Yes [provider]  pantoprazole (PROTONIX) 40 MG tablet TAKE 1 TABLET BY MOUTH EVERY DAY 01/09/20  Yes McGowen, PAdrian Blackwater MD  rosuvastatin (CRESTOR) 40 MG tablet take 1 tablet by mouth once daily Patient taking differently: Take 40 mg by mouth daily.  02/05/17  Yes Crenshaw,  Denice Bors, MD  sacubitril-valsartan (ENTRESTO) 24-26 MG Take 1 tablet by mouth  2 (two) times daily.  07/19/17  Yes [provider]  torsemide (DEMADEX) 20 MG tablet Take 20 mg by mouth 2 (two) times daily as needed (swelling).    Yes [provider]  vitamin C (ASCORBIC ACID) 250 MG tablet Take 250 mg by mouth daily.   Yes [provider]  VITAMIN D, CHOLECALCIFEROL, PO Take 1 tablet by mouth daily.    Yes [provider]  Grant Ruts INHUB 250-50 MCG/DOSE AEPB TAKE 1 PUFF BY MOUTH TWICE A DAY 02/17/20  Yes McGowen, Adrian Blackwater, MD    Scheduled Meds: . allopurinol  100 mg Oral BID  . Chlorhexidine Gluconate Cloth  6 each Topical Daily  . ezetimibe  10 mg Oral Daily  . finasteride  5 mg Oral Daily  . fluticasone  1 spray Each Nare Daily  . gabapentin  800 mg Oral QID  . insulin aspart  0-9 Units Subcutaneous TID WC  . isosorbide mononitrate  30 mg Oral Daily  . magnesium oxide  400 mg Oral Daily  . metoprolol succinate  25 mg Oral Daily  . mometasone-formoterol  2 puff Inhalation BID  .  morphine injection  2 mg Intravenous Once  . pantoprazole  40 mg Oral Q1200  . rosuvastatin  40 mg Oral QHS  . sacubitril-valsartan  1 tablet Oral BID  . sodium chloride flush  10-40 mL Intracatheter Q12H  . spironolactone  12.5 mg Oral Daily  . torsemide  20 mg Oral Daily   Infusions: . sodium chloride 20 mL/hr at 05/03/20 1615  . ampicillin (OMNIPEN) IV 2 g (05/04/20 0829)  . cefTRIAXone (ROCEPHIN)  IV Stopped (05/03/20 2236)   PRN Meds: albuterol, nitroGLYCERIN, sodium chloride, sodium chloride flush   Allergies as of 04/28/2020 - Review Complete 04/28/2020  Allergen Reaction Noted  . Brimonidine tartrate Shortness Of Breath 06/22/2010  . Brinzolamide Shortness Of Breath 06/22/2010  . Latanoprost Shortness Of Breath 06/22/2010  . Nucynta [tapentadol] Shortness Of Breath 02/09/2016  . Sulfa antibiotics Palpitations 01/17/2016  . Timolol maleate Shortness Of Breath and Other (See Comments) 06/22/2010  . Diltiazem Swelling 06/22/2010  .  Rofecoxib Swelling 06/22/2010  . Vancomycin Hives and Other (See Comments) 06/22/2010  . Codeine Other (See Comments) 06/22/2010  . Tamsulosin Other (See Comments) 10/21/2019  . Celecoxib Other (See Comments) 06/22/2010  . Colchicine Diarrhea   . Tape Rash 05/24/2016    Family History  Problem Relation Age of Onset  . Hypertension Mother   . Coronary artery disease Mother   . Heart attack Mother   . Neuropathy Mother   . Pulmonary fibrosis Father        asbestosis    Social History   Socioeconomic History  . Marital status: Married    Spouse name: Not on file  . Number of children: 0  . Years of education: 69  . Highest education level: Not on file  Occupational History  . Occupation: Engineer, production    Comment: retired  Tobacco Use  . Smoking status: Never Smoker  . Smokeless tobacco: Never Used  Vaping Use  . Vaping Use: Never used  Substance and Sexual Activity  . Alcohol use: No    Alcohol/week: 0.0 standard drinks  . Drug use: No    Types: Oxycodone  . Sexual activity: Not Currently  Other Topics Concern  . Not on file  Social History Narrative   HS[employer], [name] - BS,  [name] - MS-engineering, 2 years on PhD - [name]. Married - '65 - 3yr/divorced; '76- 3 yrs/divorced; '92 . No children. 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 ACP/Living Will - Yes CPR; long-term Mechanical ventilation as long as he was able to cognate; ok for long term artificial nutrition. Precondition being able to cognate and not to have too much pain.    Social Determinants of Health   Financial Resource Strain:   . Difficulty of Paying Living Expenses: Not on file  Food Insecurity:   . Worried About RCharity fundraiserin the Last Year: Not on file  . Ran Out of Food in the Last Year: Not on file  Transportation Needs:   . Lack of Transportation (Medical): Not on file  . Lack of Transportation (Non-Medical): Not on file  Physical Activity:    . Days of Exercise per Week: Not on file  . Minutes of Exercise per Session: Not on file  Stress:   . Feeling of Stress : Not on file  Social Connections:   . Frequency of Communication with Friends and Family: Not on file  . Frequency of Social Gatherings with Friends and Family: Not on file  . Attends Religious Services: Not on file  . Active Member of Clubs or Organizations: Not on file  . Attends CArchivistMeetings: Not on file  . Marital Status: Not on file  Intimate Partner Violence:   . Fear of Current or Ex-Partner: Not on file  . Emotionally Abused: Not on file  . Physically Abused: Not on file  . Sexually Abused: Not on file    REVIEW OF SYSTEMS: Constitutional: No new weakness or fatigue.  Reports 65 # weight loss in the last year attributed to watching his diet as he has not had anorexia. ENT:  No nose bleeds Pulm: Stable exertional dyspnea but does not move around a lot so it does not happen frequently.  No labored breathing at rest.  No cough.   CV:  No palpitations, no LE edema.  No angina. GU:  No hematuria, no frequency GI: See HPI.  Denies dysphagia. Heme: Frequent purpura but no unusual bleeding or large bruising.  Takes vitamin B12 chronically. Transfusions: No previous blood transfusions. Neuro:  No headaches, no peripheral tingling or numbness Derm:  No itching, no rash or sores.  Endocrine:  No sweats or chills.  No polyuria or dysuria Immunization: Not queried. Travel:  None beyond local counties in last few months.    PHYSICAL EXAM: Vital signs in last 24 hours: Vitals:   05/04/20 0404 05/04/20 0821  BP: (!) 108/51 (!) 120/54  Pulse: 61 65  Resp: 15 18  Temp: 97.8 F (36.6 C) (!) 97.5 F (36.4 C)  SpO2: 93% 98%   Wt Readings from Last 3 Encounters:  05/04/20 (!) 146 kg  04/21/20 (!) 141.5 kg  04/19/20 (!) 145.2 kg    General:  Morbidly obese, chronically ill-appearing, alert, comfortable..  Pleasant, cooperative.   Head: No  facial asymmetry.  Coloring somewhat ashen.  No signs of head trauma. Eyes: No conjunctival pallor or scleral icterus. Ears: Not hard of hearing. Nose: No discharge or congestion. Mouth: Good dentition.  Tongue midline.  Mucosa moist, pink, clear. Neck: BHiginio Planpresent.  Do not appreciate JVD or hepatojugular reflux. Lungs: Greatly diminished breath sounds bilaterally.  No cough.  No labored breathing at rest. Heart: RRR with soft/subtle murmur. Abdomen: Obese, nontender,  nondistended.  No masses, HSM, bruits, hernias appreciated.  Some fullness without palpable liver edge in the right upper quadrant..   Rectal: Deferred Musc/Skeltl: No joint redness or swelling. Extremities: Swelling in both lower extremities, both of which are wrapped with Coban.  Left transmetatarsal amputation. Neurologic: Fully alert and oriented.  No tremor.  Moves all 4 limbs, strength not tested. Skin: No significant bruising.  No telangiectasia.  Some purpura on the arms. Tattoos: None observed Psych: Pleasant, cooperative.  Intake/Output from previous day: 11/01 0701 - 11/02 0700 In: 1593 [P.O.:473; I.V.:220; IV Piggyback:900] Out: 1975 [YWVPX:1062] Intake/Output this shift: No intake/output data recorded.  LAB RESULTS: Recent Labs    05/02/20 0241 05/03/20 0350 05/04/20 0357  WBC 5.6 6.3 5.0  HGB 9.3* 10.7* 10.0*  HCT 28.9* 33.3* 30.8*  PLT 104* 130* 120*   BMET Lab Results  Component Value Date   NA 141 05/04/2020   NA 141 05/03/2020   NA 142 05/02/2020   K 3.2 (L) 05/04/2020   K 3.8 05/03/2020   K 3.5 05/02/2020   CL 103 05/04/2020   CL 105 05/03/2020   CL 103 05/02/2020   CO2 28 05/04/2020   CO2 27 05/03/2020   CO2 29 05/02/2020   GLUCOSE 115 (H) 05/04/2020   GLUCOSE 135 (H) 05/03/2020   GLUCOSE 123 (H) 05/02/2020   BUN 11 05/04/2020   BUN 12 05/03/2020   BUN 18 05/02/2020   CREATININE 0.97 05/04/2020   CREATININE 0.90 05/03/2020   CREATININE 1.15 05/02/2020   CALCIUM 8.2 (L)  05/04/2020   CALCIUM 8.7 (L) 05/03/2020   CALCIUM 8.5 (L) 05/02/2020   LFT Recent Labs    05/02/20 0241 05/03/20 0350 05/04/20 0357  PROT 5.6* 6.3* 5.6*  ALBUMIN 2.8* 3.1* 2.8*  AST 25 30 26   ALT 21 25 23   ALKPHOS 43 52 47  BILITOT 0.7 0.6 0.7   PT/INR Lab Results  Component Value Date   INR 1.2 09/11/2019   INR 1.6 (H) 08/29/2019   INR 1.07 04/10/2013   Hepatitis Panel No results for input(s): HEPBSAG, HCVAB, HEPAIGM, HEPBIGM in the last 72 hours. C-Diff No components found for: CDIFF Lipase  No results found for: LIPASE  Drugs of Abuse     Component Value Date/Time   LABOPIA PPS 06/09/2015 1405   COCAINSCRNUR NEG 06/09/2015 1405   LABBENZ NEG 06/09/2015 1405   AMPHETMU NEG 06/09/2015 1405   THCU NEG 06/09/2015 1405   LABBARB NEG 06/09/2015 1405     RADIOLOGY STUDIES: No results found.    IMPRESSION:   *   ?  Esophageal stricture.  Dr. Gasper Sells unable to pass TEE scope on 11/1.  *   Fever.  Hx TAVR and ICD lead infection.  Now with question of recurrent native valve mitral endocarditis.  Dr. Gasper Sells unable to pass TEE scope on 11/1.   *   Chronic Eliquis, last dose yest AM.    *   CHF.     *   Hypokalemia.  Potassium 3.2.  *    Normocytic anemia.  Stable Hgb for many months.   *    Thrombocytopenia.  Platelets today 120, were in the 90s a few days ago.  No evidence for liver disease on CTAP with contrast in 09/2019.  *    Adenomatous colon polyps, diverticulosis on 2014 colonoscopy.  Dr. Carlean Purl did not recommend follow-up surveillance colonoscopy as of 2019.    PLAN:     *    EGD today.  Azucena Freed  05/04/2020, 8:53 AM Phone [phone]      Attending physician's note   I have taken an interval history, reviewed the chart and examined the patient. I agree with the Advanced Practitioner's note, impression and recommendations.   Cards unable to pass TEE scope.  Once EGD before re-attempt at  TEE No dysphagia H/O  Candida esophagitis 2010 Comorbid conditions include infective endocarditis, morbid obesity, IDDM, CHF, OSA on CPAP, PAH, Venous stasis.  A fib on chonic AC (last dose of Eliquis was yesterday 05/03/2020), COPD.  S/P TAVR.  S/p ICD.  Plan: -EGD today.  If needed we will also perform dilatation.  Explained risks and benefits in detail.  He wishes to proceed.  Cardiology has cleared for EGD.   Carmell Austria, MD [facility]

## 2020-05-04 NOTE — Anesthesia Postprocedure Evaluation (Signed)
Anesthesia Post Note  Patient: Tim [name]  Procedure(s) Performed: ESOPHAGOGASTRODUODENOSCOPY (EGD) WITH PROPOFOL ([name] ) BIOPSY ESOPHAGEAL DILATION     Patient location during evaluation: Endoscopy Anesthesia Type: MAC Level of consciousness: awake and alert Pain management: pain level controlled Vital Signs Assessment: post-procedure vital signs reviewed and stable Respiratory status: spontaneous breathing, nonlabored ventilation and respiratory function stable Cardiovascular status: blood pressure returned to baseline and stable Postop Assessment: no apparent nausea or vomiting Anesthetic complications: no   No complications documented.  Last Vitals:  Vitals:   05/04/20 1030 05/04/20 1113  BP: (!) 107/49 127/60  Pulse: 63 95  Resp: 10 14  Temp:  (!) 36.4 C  SpO2: 100% 98%    Last Pain:  Vitals:   05/04/20 1113  TempSrc: Oral  PainSc:                  Merlinda Frederick

## 2020-05-04 NOTE — Care Management Important Message (Signed)
Important Message  Patient Details  Name: GIANCARLO ASKREN MRN: [id number] Date of Birth: [birth year]   Medicare Important Message Given:  Yes     Shelda Altes 05/04/2020, 11:50 AM

## 2020-05-04 NOTE — Transfer of Care (Signed)
Immediate Anesthesia Transfer of Care Note  Patient: Tim [name]  Procedure(s) Performed: ESOPHAGOGASTRODUODENOSCOPY (EGD) WITH PROPOFOL ([name] ) BIOPSY ESOPHAGEAL DILATION  Patient Location: Endoscopy Unit  Anesthesia Type:MAC  Level of Consciousness: drowsy  Airway & Oxygen Therapy: Patient Spontanous Breathing and Patient connected to nasal cannula oxygen  Post-op Assessment: Report given to RN and Post -op Vital signs reviewed and stable  Post vital signs: Reviewed and stable  Last Vitals:  Vitals Value Taken Time  BP 114/41 05/04/20 1014  Temp    Pulse 60 05/04/20 1014  Resp 18 05/04/20 1014  SpO2 97 % 05/04/20 1014  Vitals shown include unvalidated device data.  Last Pain:  Vitals:   05/04/20 0912  TempSrc: Oral  PainSc: 0-No pain         Complications: No complications documented.

## 2020-05-04 NOTE — Progress Notes (Addendum)
PROGRESS NOTE                                                                                                                                                                                                             Patient Demographics:    Tim [name], is a 79 y.o. male, DOB - 01-Sep-1940, IDP:[id number]  Outpatient Primary MD for the patient is McGowen, Adrian Blackwater, MD    LOS - 5  Admit date - 04/28/2020    Chief Complaint  Patient presents with  . Weakness       Brief Narrative (HPI from H&P)   Tim [name] is a 79 y.o. 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  Has received PICC line.   Subjective:   Patient in bed, appears comfortable, denies any headache, no fever, no chest pain or pressure, no shortness of breath , no abdominal pain. No focal weakness.   Assessment  & Plan :    1. 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 On chronic amoxicillin treatment under the care of Dr. Drucilla Schmidt ID. Now 1.3 x 1 cm mobile vegetation  attached to the anterior mitral leaflet on TTE.  Now he seems to have new mitral valve endocarditis per TTE, ID following, TEE failed on 05/03/2020 due to possible esophageal stricture, GI has done EGD on 05/04/2020 with normal-appearing esophagus however they tried to empirically stretch it so that TEE can be done, have requested cardiology to schedule TEE for 05/06/2020.  Blood cultures drawn this admission so far negative, continue empiric IV Rocephin  2 g and ampicillin.  ID following for final duration course per ID.  PICC line placed 05/01/2020.    2. Paroxysmal A. fib. [place] vas score 2 score of greater than 3.  Eliquis was held for EGD on 05/04/2020, resume on 05/05/2020, resume beta-blocker as blood pressure has improved .  3. Ischemic cardiomyopathy with chronic systolic heart failure EF around 40%. He has chronic 2+ leg edema, pressure is improved low-dose beta-blocker, diuretics as tolerated by blood pressure and renal function, he had a brief episode of chest pain on 05/01/2020 with unchanged EKG, added Imdur, undergoing TEE soon, holding Entresto for now due to AKI and soft blood pressures. Cardiology on board due to few episodes of chest pain.  4. Anemia of chronic disease. Stable follow.  5. COPD. Stable no wheezing.  6. BPH. Resume alpha-blocker.  7.  Hypomagnesemia and hypokalemia.  Both replaced.  8.  AKI.  Resolved, resume low-dose diuretics at half home dose, continue to hold Entresto due to hypotension.  9.  Possible esophageal stricture noted on TEE.  GI has been consulted by cardiology.    10.Thrombocytopenia. Likely due to infection monitor.  Lab Results  Component Value Date   PLT 120 (L) 05/04/2020    8. Prediabetes. Monitor. Add ISS as he stress-induced hyperglycemia.  Lab Results  Component Value Date   HGBA1C 6.1 (H) 04/29/2020   CBG (last 3)  Recent Labs    05/03/20 1659 05/03/20 2235 05/04/20 0623  GLUCAP 101* 140* 122*   Lab Results  Component  Value Date   TSH 0.905 04/29/2020      Condition - Extremely Guarded  Family Communication  :  Wife bedside 04/29/20, called [phone] 04/30/2020 at 10:30 AM and left message, updated bedside 05/03/2020  Code Status : DNR  Consults  :  ID, cardiology, GI  Procedures  :    EGD 05/04/2020.  Normal esophagus with some gastric polyps.    PICC line placed 05/01/2020.   CT L Foot - no abscess  TTE - 1. Left ventricular ejection fraction, by estimation, is 55 to 60%. The left ventricle has normal function. The left ventricle has no regional wall motion abnormalities. Left ventricular diastolic parameters are consistent with Grade I diastolic dysfunction (impaired relaxation).  2. Right ventricular systolic function is normal. The right ventricular size is mildly enlarged. Tricuspid regurgitation signal is inadequate for assessing PA pressure.  3. Left atrial size was mildly dilated.  4. The mitral valve is degenerative. No evidence of mitral valve regurgitation. Mild mitral stenosis. The mean mitral valve gradient is 5.0 mmHg. Moderate mitral annular calcification. There is a 1.3 x 1 cm mobile vegetation attached to the anterior mitral leaflet.  5. The aortic valve was not well visualized. Aortic valve regurgitation is not visualized. Mild aortic valve stenosis. Aortic valve mean gradient measures 16.0 mmHg.  6. The inferior vena cava is normal in size with greater than 50% respiratory variability, suggesting right atrial pressure of 3 mmHg.  7. Technically difficult study with very poor windows. Possible mitral valve vegetation. The tricuspid valve, aortic valve, and ICD were poorly visualized  PUD Prophylaxis : PPI  Disposition Plan  :    Status is: Inpatient  Remains inpatient appropriate because:IV treatments appropriate due to intensity of illness or inability to take PO   Dispo: The patient is from: Home              Anticipated d/c is to: Home  Anticipated d/c date is:  > 3 days              Patient currently is not medically stable to d/c.  DVT Prophylaxis  : Eliquis >> SCDs  Lab Results  Component Value Date   PLT 120 (L) 05/04/2020    Diet :  Diet Order            DIET SOFT Room service appropriate? Yes; Fluid consistency: Thin  Diet effective now                  Inpatient Medications  Scheduled Meds: . [MAR Hold] allopurinol  100 mg Oral BID  . [MAR Hold] Chlorhexidine Gluconate Cloth  6 each Topical Daily  . [MAR Hold] ezetimibe  10 mg Oral Daily  . [MAR Hold] finasteride  5 mg Oral Daily  . [MAR Hold] fluticasone  1 spray Each Nare Daily  . [MAR Hold] gabapentin  800 mg Oral QID  . [MAR Hold] insulin aspart  0-9 Units Subcutaneous TID WC  . [MAR Hold] isosorbide mononitrate  30 mg Oral Daily  . [MAR Hold] magnesium oxide  400 mg Oral Daily  . [MAR Hold] metoprolol succinate  25 mg Oral Daily  . [MAR Hold] mometasone-formoterol  2 puff Inhalation BID  . [MAR Hold]  morphine injection  2 mg Intravenous Once  . [MAR Hold] pantoprazole  40 mg Oral Q1200  . [MAR Hold] rosuvastatin  40 mg Oral QHS  . [MAR Hold] sacubitril-valsartan  1 tablet Oral BID  . [MAR Hold] sodium chloride flush  10-40 mL Intracatheter Q12H  . [MAR Hold] spironolactone  12.5 mg Oral Daily  . [MAR Hold] torsemide  20 mg Oral Daily   Continuous Infusions: . sodium chloride 20 mL/hr at 05/03/20 1615  . [MAR Hold] ampicillin (OMNIPEN) IV 2 g (05/04/20 0829)  . [MAR Hold] cefTRIAXone (ROCEPHIN)  IV Stopped (05/03/20 2236)   PRN Meds:.[MAR Hold] albuterol, [MAR Hold] nitroGLYCERIN, [MAR Hold] sodium chloride, [MAR Hold] sodium chloride flush  Antibiotics  :    Anti-infectives (From admission, onward)   Start     Dose/Rate Route Frequency Ordered Stop   04/29/20 2200  cefTRIAXone (ROCEPHIN) 2 g in sodium chloride 0.9 % 100 mL IVPB  Status:  Discontinued        2 g 200 mL/hr over 30 Minutes Intravenous Every 24 hours 04/29/20 0013 04/29/20 1206   04/29/20 1600   [MAR Hold]  ampicillin (OMNIPEN) 2 g in sodium chloride 0.9 % 100 mL IVPB        (MAR Hold since Tue 05/04/2020 at 0906.Hold Reason: Transfer to a Procedural area.)   2 g 300 mL/hr over 20 Minutes Intravenous Every 4 hours 04/29/20 1206     04/29/20 1300  [MAR Hold]  cefTRIAXone (ROCEPHIN) 2 g in sodium chloride 0.9 % 100 mL IVPB        (MAR Hold since Tue 05/04/2020 at 0906.Hold Reason: Transfer to a Procedural area.)   2 g 200 mL/hr over 30 Minutes Intravenous Every 12 hours 04/29/20 1206     04/29/20 0600  ampicillin (OMNIPEN) 2 g in sodium chloride 0.9 % 100 mL IVPB  Status:  Discontinued        2 g 300 mL/hr over 20 Minutes Intravenous Every 6 hours 04/29/20 0013 04/29/20 1206   04/28/20 2215  cefTRIAXone (ROCEPHIN) 1 g in sodium chloride 0.9 % 100 mL IVPB  Status:  Discontinued        1  g 200 mL/hr over 30 Minutes Intravenous  Once 04/28/20 2201 04/28/20 2211   04/28/20 2215  ampicillin (OMNIPEN) 2 g in sodium chloride 0.9 % 100 mL IVPB        2 g 300 mL/hr over 20 Minutes Intravenous  Once 04/28/20 2201 04/29/20 0027   04/28/20 2215  cefTRIAXone (ROCEPHIN) 2 g in sodium chloride 0.9 % 100 mL IVPB        2 g 200 mL/hr over 30 Minutes Intravenous  Once 04/28/20 2211 04/28/20 2314       Time Spent in minutes  30   Tim Lund M.D on 05/04/2020 at 10:53 AM  To page go to [email or web address] - password [name]  [facility] -  Office  [phone]   See all Orders from today for further details    Objective:   Vitals:   05/04/20 0912 05/04/20 1011 05/04/20 1020 05/04/20 1030  BP: (!) 136/51 (!) 114/41 (!) 121/44 (!) 107/49  Pulse:  62 61 63  Resp: 11 14 11 10   Temp: 97.6 F (36.4 C) (!) 96.5 F (35.8 C)    TempSrc: Oral Oral    SpO2: 99% 98% 99% 100%  Weight:      Height:        Wt Readings from Last 3 Encounters:  05/04/20 (!) 146 kg  04/21/20 (!) 141.5 kg  04/19/20 (!) 145.2 kg     Intake/Output Summary (Last 24 hours) at 05/04/2020 1053 Last data filed at  05/04/2020 1005 Gross per 24 hour  Intake 1683 ml  Output 1975 ml  Net -292 ml     Physical Exam  Awake Alert, No new F.N deficits, Normal affect [place].AT,PERRAL Supple Neck,No JVD, No cervical lymphadenopathy appriciated.  Symmetrical Chest wall movement, Good air movement bilaterally, CTAB RRR,No Gallops, +ve systolic murmur +ve B.Sounds, Abd Soft, No tenderness, No organomegaly appriciated, No rebound - guarding or rigidity. left foot transmetatarsal amputation with a small plantar surface chronic shallow ulcer without any signs of active infection overlying first metatarsal head. He has chronic 2+ edema in both lower extremities left more than right.    Data Review:    CBC Recent Labs  Lab 04/30/20 0202 05/01/20 0319 05/02/20 0241 05/03/20 0350 05/04/20 0357  WBC 7.0 5.7 5.6 6.3 5.0  HGB 10.5* 9.9* 9.3* 10.7* 10.0*  HCT 32.7* 30.7* 28.9* 33.3* 30.8*  PLT 91* 98* 104* 130* 120*  MCV 90.8 89.2 90.3 91.7 91.7  MCH 29.2 28.8 29.1 29.5 29.8  MCHC 32.1 32.2 32.2 32.1 32.5  RDW 14.7 14.6 14.5 14.5 14.2  LYMPHSABS 1.3 1.3 1.3 1.7 1.4  MONOABS 0.8 0.8 0.7 0.6 0.6  EOSABS 0.1 0.2 0.2 0.3 0.2  BASOSABS 0.0 0.0 0.0 0.1 0.0    Recent Labs  Lab 04/28/20 2118 04/28/20 2133 04/28/20 2338 04/29/20 0759 04/29/20 0759 04/29/20 0801 04/29/20 0828 04/30/20 0202 05/01/20 0319 05/02/20 0241 05/03/20 0350 05/04/20 0357  NA   < >  --   --  141   < >  --   --  140 140 142 141 141  K   < >  --   --  4.0   < >  --   --  4.0 3.7 3.5 3.8 3.2*  CL   < >  --   --  110   < >  --   --  109 105 103 105 103  CO2   < >  --   --  23   < >  --   --  25 26 29 27 28   GLUCOSE   < >  --   --  125*   < >  --   --  127* 121* 123* 135* 115*  BUN   < >  --   --  22   < >  --   --  18 21 18 12 11   CREATININE   < >  --   --  1.09   < >  --   --  0.90 1.27* 1.15 0.90 0.97  CALCIUM   < >  --   --  8.3*   < >  --   --  8.6* 8.5* 8.5* 8.7* 8.2*  AST  --   --   --  26   < >  --   --  35 28 25 30 26   ALT   --   --   --  21   < >  --   --  23 23 21 25 23   ALKPHOS  --   --   --  48   < >  --   --  48 44 43 52 47  BILITOT  --   --   --  0.8   < >  --   --  0.7 0.5 0.7 0.6 0.7  ALBUMIN  --   --   --  2.7*   < >  --   --  2.8* 2.8* 2.8* 3.1* 2.8*  MG  --   --   --  1.4*   < >  --   --  1.6* 1.6* 1.8 2.0 1.6*  CRP  --   --   --   --   --   --   --  4.0* 1.9* 1.0* 0.7  --   DDIMER  --   --   --  1.33*  --   --   --  0.93* 0.79* 0.67* 1.30*  --   PROCALCITON  --   --   --  0.77   < >  --   --  0.55 0.43 0.24 <0.10 <0.10  LATICACIDVEN  --  1.8 2.0*  --   --  1.3  --   --   --   --   --   --   TSH  --   --   --   --   --   --  0.905  --   --   --   --   --   HGBA1C  --   --   --  6.1*  --   --   --   --   --   --   --   --   BNP  --   --   --  82.7   < >  --   --  82.6 94.0 70.3 105.3* 64.1   < > = values in this interval not displayed.    ------------------------------------------------------------------------------------------------------------------ No results for input(s): CHOL, HDL, LDLCALC, TRIG, CHOLHDL, LDLDIRECT in the last 72 hours.  Lab Results  Component Value Date   HGBA1C 6.1 (H) 04/29/2020   ------------------------------------------------------------------------------------------------------------------ No results for input(s): TSH, T4TOTAL, T3FREE, THYROIDAB in the last 72 hours.  Invalid input(s): FREET3  Cardiac Enzymes No results for input(s): CKMB, TROPONINI, MYOGLOBIN in the last 168 hours.  Invalid input(s): CK ------------------------------------------------------------------------------------------------------------------    Component Value Date/Time   BNP 64.1 05/04/2020 0357   BNP 32.0 01/14/2014 1656  Micro Results Recent Results (from the past 240 hour(s))  Respiratory Panel by RT PCR (Flu A&B, Covid) - Nasopharyngeal Swab     Status: None   Collection Time: 04/28/20 10:43 PM   Specimen: Nasopharyngeal Swab  Result Value Ref Range Status   SARS  Coronavirus 2 by RT PCR NEGATIVE NEGATIVE Final    Comment: (NOTE) SARS-CoV-2 target nucleic acids are NOT DETECTED.  The SARS-CoV-2 RNA is generally detectable in upper respiratoy specimens during the acute phase of infection. The lowest concentration of SARS-CoV-2 viral copies this assay can detect is 131 copies/mL. A negative result does not preclude SARS-Cov-2 infection and should not be used as the sole basis for treatment or other patient management decisions. A negative result may occur with  improper specimen collection/handling, submission of specimen other than nasopharyngeal swab, presence of viral mutation(s) within the areas targeted by this assay, and inadequate number of viral copies (<131 copies/mL). A negative result must be combined with clinical observations, patient history, and epidemiological information. The expected result is Negative.  Fact Sheet for Patients:  PinkCheek.be  Fact Sheet for Healthcare Providers:  GravelBags.it  This test is no t yet approved or cleared by the [place] FDA and  has been authorized for detection and/or diagnosis of SARS-CoV-2 by FDA under an Emergency Use Authorization (EUA). This EUA will remain  in effect (meaning this test can be used) for the duration of the COVID-19 declaration under Section 564(b)(1) of the Act, 21 U.S.C. section 360bbb-3(b)(1), unless the authorization is terminated or revoked sooner.     Influenza A by PCR NEGATIVE NEGATIVE Final   Influenza B by PCR NEGATIVE NEGATIVE Final    Comment: (NOTE) The Xpert Xpress SARS-CoV-2/FLU/RSV assay is intended as an aid in  the diagnosis of influenza from Nasopharyngeal swab specimens and  should not be used as a sole basis for treatment. Nasal washings and  aspirates are unacceptable for Xpert Xpress SARS-CoV-2/FLU/RSV  testing.  Fact Sheet for  Patients: PinkCheek.be  Fact Sheet for Healthcare Providers: GravelBags.it  This test is not yet approved or cleared by the [place] FDA and  has been authorized for detection and/or diagnosis of SARS-CoV-2 by  FDA under an Emergency Use Authorization (EUA). This EUA will remain  in effect (meaning this test can be used) for the duration of the  Covid-19 declaration under Section 564(b)(1) of the Act, 21  U.S.C. section 360bbb-3(b)(1), unless the authorization is  terminated or revoked. Performed at [facility], [place] [address] [place], [place] [address]   Blood culture (routine x 2)     Status: None   Collection Time: 04/28/20 11:38 PM   Specimen: BLOOD RIGHT FOREARM  Result Value Ref Range Status   Specimen Description BLOOD RIGHT FOREARM  Final   Special Requests   Final    BOTTLES DRAWN AEROBIC AND ANAEROBIC Blood Culture results may not be optimal due to an inadequate volume of blood received in culture bottles   Culture   Final    NO GROWTH 5 DAYS Performed at [facility], [place] [address], [place], [place] [address]    Report Status 05/04/2020 FINAL  Final  Blood culture (routine x 2)     Status: None   Collection Time: 04/28/20 11:50 PM   Specimen: BLOOD LEFT HAND  Result Value Ref Range Status   Specimen Description BLOOD LEFT HAND  Final   Special Requests   Final    BOTTLES DRAWN AEROBIC AND  ANAEROBIC Blood Culture results may not be optimal due to an inadequate volume of blood received in culture bottles   Culture   Final    NO GROWTH 5 DAYS Performed at [place] [address] [place], [place] [address]    Report Status 05/04/2020 FINAL  Final    Radiology Reports CT FOOT LEFT WO CONTRAST  Result Date: 04/29/2020 CLINICAL DATA:  Question of osteomyelitis EXAM: CT OF THE LEFT FOOT WITHOUT CONTRAST TECHNIQUE: Multidetector CT imaging of the left foot was performed according  to the standard protocol. Multiplanar CT image reconstructions were also generated. COMPARISON:  None. FINDINGS: Bones/Joint/Cartilage The patient is status post transmetatarsal first through fifth digit amputation. There is no definite areas of cortical destruction or periosteal reaction noted. The amputation sites appear to be grossly intact. 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 Calcaneal enthesophyte seen on the plantar surface. There is diffuse osteopenia. Ligaments Suboptimally assessed by CT. Muscles and Tendons There is diffuse fatty atrophy of the muscles surrounding the forefoot. The flexor extensor tendons appear to be grossly intact. The plantar fascia appears to be intact. Soft tissues Overlying the plantar the near the first metatarsal amputation site there is a focal area of superficial ulceration with diffuse skin thickening and subcutaneous edema. No definite loculated fluid collection or subcutaneous emphysema is seen. IMPRESSION: Superficial ulceration along the plantar base beneath the first metatarsal amputation site with cellulitis and postsurgical changes. No definite loculated fluid collection or subcutaneous emphysema. Status post transmetatarsal amputation of the first through fifth digits without definite evidence of osteomyelitis. Electronically Signed   By: Prudencio Pair M.D.   On: 04/29/2020 03:30   DG Chest Port 1 View  Result Date: 04/28/2020 CLINICAL DATA:  Chest pain, weakness and dizziness. EXAM: PORTABLE CHEST 1 VIEW COMPARISON:  01/20/2020 FINDINGS: Mild patient rotation. Multi lead left-sided pacemaker. Cardiomegaly slightly increased from prior exam. Unchanged mediastinal contours. Aortic atherosclerosis. Mild vascular engorgement. No pulmonary edema. No focal airspace disease. No large pleural effusion. No pneumothorax. The bones appear under mineralized. Spinal stimulator in place. Multiple overlying monitoring devices. No acute osseous  abnormalities are seen. IMPRESSION: Cardiomegaly with mild vascular congestion. Electronically Signed   By: Keith Rake M.D.   On: 04/28/2020 21:40   ECHOCARDIOGRAM COMPLETE  Result Date: 04/29/2020    ECHOCARDIOGRAM REPORT   Patient Name:   Tim [name] Date of Exam: 04/29/2020 Medical Rec #:  [id number]       Height:       72.0 in Accession #:    [id number]      Weight:       320.0 lb Date of Birth:  [birth year]       BSA:          2.602 m Patient Age:    34 years        BP:           127/96 mmHg Patient Gender: M               HR:           66 bpm. Exam Location:  Inpatient Procedure: 2D Echo and Intracardiac Opacification Agent Indications:    endocarditis  History:        Patient has prior history of Echocardiogram examinations, most                 recent 01/16/2020. CAD, Defibrillator, COPD,  Signs/Symptoms:Fever; Risk Factors:Sleep Apnea.  Sonographer:    Johny Chess Referring Phys: Graylin Shiver [facility]  Sonographer Comments: Suboptimal parasternal window and patient is morbidly obese. Image acquisition challenging due to patient body habitus. IMPRESSIONS  1. Left ventricular ejection fraction, by estimation, is 55 to 60%. The left ventricle has normal function. The left ventricle has no regional wall motion abnormalities. Left ventricular diastolic parameters are consistent with Grade I diastolic dysfunction (impaired relaxation).  2. Right ventricular systolic function is normal. The right ventricular size is mildly enlarged. Tricuspid regurgitation signal is inadequate for assessing PA pressure.  3. Left atrial size was mildly dilated.  4. The mitral valve is degenerative. No evidence of mitral valve regurgitation. Mild mitral stenosis. The mean mitral valve gradient is 5.0 mmHg. Moderate mitral annular calcification. There is a 1.3 x 1 cm mobile vegetation attached to the anterior mitral leaflet.  5. The aortic valve was not well visualized. Aortic valve regurgitation is  not visualized. Mild aortic valve stenosis. Aortic valve mean gradient measures 16.0 mmHg.  6. The inferior vena cava is normal in size with greater than 50% respiratory variability, suggesting right atrial pressure of 3 mmHg.  7. Technically difficult study with very poor windows. Possible mitral valve vegetation. The tricuspid valve, aortic valve, and ICD were poorly visualized. Suggest TEE. FINDINGS  Left Ventricle: Left ventricular ejection fraction, by estimation, is 55 to 60%. The left ventricle has normal function. The left ventricle has no regional wall motion abnormalities. Definity contrast agent was given IV to delineate the left ventricular  endocardial borders. The left ventricular internal cavity size was normal in size. There is no left ventricular hypertrophy. Left ventricular diastolic parameters are consistent with Grade I diastolic dysfunction (impaired relaxation). Right Ventricle: The right ventricular size is mildly enlarged. No increase in right ventricular wall thickness. Right ventricular systolic function is normal. Tricuspid regurgitation signal is inadequate for assessing PA pressure. Left Atrium: Left atrial size was mildly dilated. Right Atrium: Right atrial size was normal in size. Pericardium: There is no evidence of pericardial effusion. Mitral Valve: The mitral valve is degenerative in appearance. There is moderate thickening of the mitral valve leaflet(s). Moderate mitral annular calcification. No evidence of mitral valve regurgitation. Mild mitral valve stenosis. MV peak gradient, 10.1 mmHg. The mean mitral valve gradient is 5.0 mmHg. Tricuspid Valve: The tricuspid valve is not well visualized. Tricuspid valve regurgitation is not demonstrated. Aortic Valve: The aortic valve was not well visualized. Aortic valve regurgitation is not visualized. Mild aortic stenosis is present. Aortic valve mean gradient measures 16.0 mmHg. Aortic valve peak gradient measures 30.9 mmHg. Pulmonic  Valve: The pulmonic valve was not well visualized. Pulmonic valve regurgitation is not visualized. Aorta: Aortic root could not be assessed. Venous: The inferior vena cava is normal in size with greater than 50% respiratory variability, suggesting right atrial pressure of 3 mmHg. IAS/Shunts: No atrial level shunt detected by color flow Doppler. Additional Comments: A pacer wire is visualized in the right ventricle.   Diastology LV e' medial:    6.74 cm/s LV E/e' medial:  18.4 LV e' lateral:   8.05 cm/s LV E/e' lateral: 15.4  RIGHT VENTRICLE             IVC RV S prime:     15.60 cm/s  IVC diam: 1.70 cm TAPSE (M-mode): 3.0 cm LEFT ATRIUM             Index LA Vol (A2C):   78.1 ml 30.02 ml/m  LA Vol (A4C):   90.2 ml 34.67 ml/m LA Biplane Vol: 91.5 ml 35.17 ml/m  AORTIC VALVE AV Vmax:           278.00 cm/s AV Vmean:          182.000 cm/s AV VTI:            0.576 m AV Peak Grad:      30.9 mmHg AV Mean Grad:      16.0 mmHg LVOT Vmax:         119.00 cm/s LVOT Vmean:        81.800 cm/s LVOT VTI:          0.236 m LVOT/AV VTI ratio: 0.41 MITRAL VALVE MV Area (PHT): 2.05 cm     SHUNTS MV Peak grad:  10.1 mmHg    Systemic VTI: 0.24 m MV Mean grad:  5.0 mmHg MV Vmax:       1.59 m/s MV Vmean:      100.0 cm/s MV Decel Time: 370 msec MV E velocity: 124.00 cm/s MV A velocity: 132.00 cm/s MV E/A ratio:  0.94 Loralie Champagne MD Electronically signed by Loralie Champagne MD Signature Date/Time: 04/29/2020/6:03:22 PM    Final    [name] EKG SITE RITE  Result Date: 04/30/2020 If Site Rite image not attached, placement could not be confirmed due to current cardiac rhythm.

## 2020-05-05 DIAGNOSIS — A419 Sepsis, unspecified organism: Secondary | ICD-10-CM | POA: Diagnosis not present

## 2020-05-05 DIAGNOSIS — I330 Acute and subacute infective endocarditis: Secondary | ICD-10-CM | POA: Diagnosis not present

## 2020-05-05 DIAGNOSIS — L97521 Non-pressure chronic ulcer of other part of left foot limited to breakdown of skin: Secondary | ICD-10-CM

## 2020-05-05 DIAGNOSIS — R7881 Bacteremia: Secondary | ICD-10-CM | POA: Diagnosis not present

## 2020-05-05 DIAGNOSIS — I872 Venous insufficiency (chronic) (peripheral): Secondary | ICD-10-CM | POA: Diagnosis not present

## 2020-05-05 DIAGNOSIS — I2510 Atherosclerotic heart disease of native coronary artery without angina pectoris: Secondary | ICD-10-CM | POA: Diagnosis not present

## 2020-05-05 DIAGNOSIS — Z952 Presence of prosthetic heart valve: Secondary | ICD-10-CM | POA: Diagnosis not present

## 2020-05-05 LAB — CBC WITH DIFFERENTIAL/PLATELET
Abs Immature Granulocytes: 0.02 10*3/uL (ref 0.00–0.07)
Basophils Absolute: 0.1 10*3/uL (ref 0.0–0.1)
Basophils Relative: 1 %
Eosinophils Absolute: 0.3 10*3/uL (ref 0.0–0.5)
Eosinophils Relative: 5 %
HCT: 32.7 % — ABNORMAL LOW (ref 39.0–52.0)
Hemoglobin: 10.7 g/dL — ABNORMAL LOW (ref 13.0–17.0)
Immature Granulocytes: 0 %
Lymphocytes Relative: 27 %
Lymphs Abs: 1.7 10*3/uL (ref 0.7–4.0)
MCH: 29.9 pg (ref 26.0–34.0)
MCHC: 32.7 g/dL (ref 30.0–36.0)
MCV: 91.3 fL (ref 80.0–100.0)
Monocytes Absolute: 0.6 10*3/uL (ref 0.1–1.0)
Monocytes Relative: 9 %
Neutro Abs: 3.8 10*3/uL (ref 1.7–7.7)
Neutrophils Relative %: 58 %
Platelets: 136 10*3/uL — ABNORMAL LOW (ref 150–400)
RBC: 3.58 MIL/uL — ABNORMAL LOW (ref 4.22–5.81)
RDW: 13.9 % (ref 11.5–15.5)
WBC: 6.4 10*3/uL (ref 4.0–10.5)
nRBC: 0 % (ref 0.0–0.2)

## 2020-05-05 LAB — PROCALCITONIN: Procalcitonin: <0.1 ng/mL

## 2020-05-05 LAB — COMPREHENSIVE METABOLIC PANEL
ALT: 22 U/L (ref 0–44)
AST: 23 U/L (ref 15–41)
Albumin: 3 g/dL — ABNORMAL LOW (ref 3.5–5.0)
Alkaline Phosphatase: 47 U/L (ref 38–126)
Anion gap: 10 (ref 5–15)
BUN: 10 mg/dL (ref 8–23)
CO2: 28 mmol/L (ref 22–32)
Calcium: 8.7 mg/dL — ABNORMAL LOW (ref 8.9–10.3)
Chloride: 104 mmol/L (ref 98–111)
Creatinine, Ser: 1.05 mg/dL (ref 0.61–1.24)
GFR, Estimated: >60 mL/min (ref >60)
Glucose, Bld: 122 mg/dL — ABNORMAL HIGH (ref 70–99)
Potassium: 3.7 mmol/L (ref 3.5–5.1)
Sodium: 142 mmol/L (ref 135–145)
Total Bilirubin: 0.5 mg/dL (ref 0.3–1.2)
Total Protein: 6.2 g/dL — ABNORMAL LOW (ref 6.5–8.1)

## 2020-05-05 LAB — BRAIN NATRIURETIC PEPTIDE: B Natriuretic Peptide: 87.1 pg/mL (ref 0.0–100.0)

## 2020-05-05 LAB — GLUCOSE, CAPILLARY
Glucose-Capillary: 118 mg/dL — ABNORMAL HIGH (ref 70–99)
Glucose-Capillary: 163 mg/dL — ABNORMAL HIGH (ref 70–99)
Glucose-Capillary: 135 mg/dL — ABNORMAL HIGH (ref 70–99)
Glucose-Capillary: 111 mg/dL — ABNORMAL HIGH (ref 70–99)

## 2020-05-05 LAB — SURGICAL PATHOLOGY

## 2020-05-05 LAB — MAGNESIUM: Magnesium: 1.8 mg/dL (ref 1.7–2.4)

## 2020-05-05 MED ORDER — SODIUM CHLORIDE 0.9 % IV SOLN: INTRAVENOUS | Status: DC

## 2020-05-05 NOTE — Consult Note (Signed)
ORTHOPAEDIC CONSULTATION  REQUESTING PHYSICIAN: Darliss Cheney, MD  Chief Complaint: Persistent chronic ulcer left transmetatarsal amputation  HPI: Tim [name] is a 79 y.o. male who presents with fever with concern for cardiac infection.  Patient denies any acute changes with his legs.  Past Medical History:  Diagnosis Date  . AICD (automatic cardioverter/defibrillator) present   . ASTHMA   . Asthma    as a child  . Balanitis    +severe phimosis+ buried penis->circ not possible but dorsal slit done 10/2019  . BPH (benign prostatic hypertrophy)    with urinary retention.  Renal u/s 12/04/19 NORMAL KIDNEYS, NORMAL BLADDER, NO HYDRONEPHROSIS  . CAD (coronary artery disease)    Nonobstructive by 12/2012 cath; then 03/2016 he required BMS to RCA ([facility]).  In-stent restenosis on cath 11/01/16, baloon angioplasty successful--pt to take plavix (no ASA), + eliquis (for PAF).  [name] CATARACT, HX OF   . Chronic combined systolic and diastolic CHF (congestive heart failure) ([place]) 05/2016   Ischemic CM.  04/2018 EF 40-45%, grd I DD.  [name] Chronic pain syndrome    Lumbar DDD; chronic neuropathic pain (DM); has spinal stimulator and sees pain mgmt MD  . Complicated UTI (urinary tract infection) 09/2019   Phimoses, acute urinary retention, entoerococcus UTI, enterococc bacteremia.  F/u blood clx's neg x 5d.    [name] COPD   . Diabetes mellitus type 2 with complications (HCC)    KNL9J jumped from 5.7% to 6.1% 03/2015---started metformin at that time.  DM 2 dx by fasting gluc criteria 2018.  Has chronic neuropath pain  . Elevated transaminase level 02/2016   Suspect due to fatty liver (documented on u/s 2007). Plan repeat labs 03/2016.  [name] Enterococcal bacteremia 09/2019   Phimoses, acute urinary retention, entoerococcus UTI, enterococc bacteremia.  F/u blood clx's neg x 5d.    . Essential hypertension, benign   . Fatty liver 2007   2007 u/s showed fatty liver with hepatosplenomegaly.  2019 repeat u/s->fatty  liver but no cirrhosis or hepatosplenomegaly.  . FUO (fever of unknown origin) 11/26/2019  . GERD (gastroesophageal reflux disease)   . Glaucoma   . GOUT   . Hallux valgus (acquired)   . HH (hiatus hernia)   . HYPERCHOLESTEROLEMIA-PURE   . Hypogonadism male   . ICD (implantable cardioverter-defibrillator) infection ([place]) 12/22/2019  . 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 Tommy Medal)  . Lumbosacral neuritis   . Lumbosacral spondylosis    Lumbar spinal stenosis with neurogenic claudication--contributes to his chronic pain syndrome  . Normal memory function 08/2014   Neuropsychological testing ([facility]): no cognitive impairment or sign of neurodegenerative disorder.  Likely has adjustment d/o with mixed anxiety/depressed features and may benefit from low dose SNRI.    [name] Normocytic anemia 03/2016   Mild-pt needs ferritin and vit B12 level checked (as of 03/22/16). Hb stable 09/2019  . NSTEMI (non-ST elevated myocardial infarction) ([place]) 03/20/2016   BMS to RCA  . Obesity hypoventilation syndrome ([place])   . Obesity, unspecified   . Orthostatic hypotension   . OSA on CPAP    8 cm H2O  . OSTEOARTHRITIS   . Paroxysmal atrial fibrillation ([place]) 2003    (? chronic?) Off anticoag for a while due to falls.  Then apixaban started 12/2014.  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 Ella Bodo): neuropathic pain->diff to treat, failed nucynta, failed spinal  stimul trial, oxycontin hs + tramadol + gabap as of 12/2017 f/u Dr. Letta Pate.  . Personal history of colonic adenoma 10/30/2012   Diminutive adenoma, consider repeat 2019 per GI  . PFO (patent foramen ovale) 09/2019   small, with predominately L to R shunt  . Pneumonia   . Presence of cardiac defibrillator 11/07/2017  . Primary osteoarthritis of both knees    Bone on bone of medial compartments, +  signif patellofemoral arth bilat.--supartz inj series started 09/12/17  . 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 PUD (peptic ulcer disease)   . PULMONARY HYPERTENSION, HX OF   . Secondary male hypogonadism 2017  . Sepsis ([place]) 04/29/2020  . 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 Shortness of breath    with exertion: much improved s/p TAVR and treatment for CHF.  . Sick sinus syndrome (HCC)    PPM placed  . Thrombocytopenia ([place]) 2018   HSM on 2007 abd u/s---suspect some mild splenic sequestration chronically.  [name] Unspecified glaucoma(365.9)   . Unspecified hereditary and idiopathic peripheral neuropathy approx age 29   bilat LE's, ? left arm, too.  Feet became progressively numb + left foot pain intermittently.  Pt may be trying a spinal stimulator (as of 05/2015)  . VENOUS INSUFFICIENCY    Being followed by Dr. Sharol Given as of 10/2016 for two R LL venous stasis ulcers/skin tears.  Healed as of 10/30/16 f/u with Dr. Sharol Given.  . VENTRAL HERNIA   . Wound dehiscence 10/22/2019   Past Surgical History:  Procedure Laterality Date  . AMPUTATION Left 04/11/2013   Procedure: AMPUTATION DIGIT Left 3rd toe;  Surgeon: [name], MD;  Location: [facility];  Service: Orthopedics;  Laterality: Left;  Left 3rd toe amputation at [facility]  . AMPUTATION Left 08/29/2019   Procedure: LEFT TRANSMETATARSAL AMPUTATION;  Surgeon: [name], MD;  Location: [facility];  Service: Orthopedics;  Laterality: Left;  . CARDIAC CATHETERIZATION  1997; 03/10/16   1997 Non-obstructive disease.  03/2016 BMS to RCA, with 25% pDiag dz, o/w normal cors per cath 03/07/16.  Cath 11/01/16: in stent restenosis, successful baloon angioplasty.  [name] CARDIAC CATHETERIZATION  12/24/2012   mild < 20% LCx, prox 30% RCA; LVEF 55-65% , moderate pulmonary HTN, moderate AS  . CARDIAC DEFIBRILLATOR PLACEMENT  11/07/2017   [facility] MRI Quad CRT defibrillator  . CARDIOVASCULAR STRESS TEST   05/11/16 ([facility])   Myocardial perfusion imaging:  No ischemia; scar in apex, global hypokinesis, EF 36%.  . Carotid dopplers  03/09/2016   [facility]: no hemodynamically significant stenosis on either side.  . CHOLECYSTECTOMY    . COLONOSCOPY [name] 10/30/2012   Procedure: COLONOSCOPY;  Surgeon: [name], MD;  Location: [facility];  Service: Endoscopy;  Laterality: [name];  . CORONARY ANGIOPLASTY WITH STENT PLACEMENT  03/2016; 04/2017   2017-[facility]: BMS to RCA-pt was placed on Brilinta.  04/2017: DES to RCA.  . DORSAL SLIT [name] 10/29/2019   for severe phimosis. Procedure: DORSAL SLIT;  Surgeon: Alexis Frock, MD;  Location: [facility];  Service: Urology;  Laterality: [name];  54 MINS  . EYE SURGERY Bilateral cataract  . HEMORRHOID SURGERY    . INTRAOCULAR LENS INSERTION Bilateral   . KNEE SURGERY Right   . LEFT AND RIGHT HEART CATHETERIZATION WITH CORONARY ANGIOGRAM [name] 12/24/2012   Procedure: LEFT AND RIGHT HEART CATHETERIZATION WITH CORONARY ANGIOGRAM;  Surgeon: [name], MD;  Location: [facility]  CATH LAB;  Service: Cardiovascular;  Laterality: [name];  . LEG SURGERY Bilateral    lenghtening   . PACEMAKER PLACEMENT  04/13/2016   2nd deg HB after TAVR, pt had DC MDT PPM placed.  [name] SHOULDER ARTHROSCOPY  08/30/2011   Procedure: ARTHROSCOPY SHOULDER;  Surgeon: [name], MD;  Location: [facility];  Service: Orthopedics;  Laterality: Right;  Right Shoulder Arthroscopy, Debridement, and Decompression  . SPINAL CORD STIMULATOR INSERTION [name] 09/10/2015   Procedure: LUMBAR SPINAL CORD STIMULATOR INSERTION;  Surgeon: [name], MD;  Location: [facility];  Service: Neurosurgery;  Laterality: [name];  . TEE WITHOUT CARDIOVERSION [name] 09/16/2019   Procedure: TRANSESOPHAGEAL ECHOCARDIOGRAM (TEE);  Surgeon: Skeet Latch, MD;  Location: [facility];  Service: Cardiovascular;  Laterality: [name];  . TEE WITHOUT CARDIOVERSION [name] 12/02/2019   +vegetation on AVR and pacer lead in RV.  EF 55-60%, normal wall motion.   Valves function normal.  Procedure: TRANSESOPHAGEAL ECHOCARDIOGRAM (TEE);  Surgeon: Geralynn Rile, MD;  Location: [facility];  Service: Cardiovascular;  Laterality: [name];  . 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  R big toe surg due to osteoarth  . TONSILLECTOMY    . traeculectomy Left    eye  . TRANSCATHETER AORTIC VALVE REPLACEMENT, TRANSFEMORAL  04/11/2016  . TRANSESOPHAGEAL ECHOCARDIOGRAM  03/09/2016; 09/2019   [facility]: EF 55-60%, PFO seen with bi-directional shunting, no thrombus in appendage.  09/2019 ->no valvular vegetations. Small patent foramen ovale with predominantly left to right shunting across the interatrial septum.  . TRANSTHORACIC ECHOCARDIOGRAM  01/2015; 01/2016; 05/18/16; 09/18/16, 05/2017, 08/2017   01/2015 No signif change in aortic stenosis (moderate).  01/2016 Severe LVH w/small LV cavity, EF 60-65%, grade I diast dysfxn.  05/2016 (s/p TAVR): EF 50-55%, grd I DD, biopros AV good.  08/2016--EF 50-55%, LV septal motion c/w conduction abnl, grd I DD,mild MS,bioprosth aortic valve well seated, w/trace AR. 05/2017 TTE EF 35%. 08/2017-EF 35%, mod diff hypokin LV, grd I DD, biopros AV good.   . TRANSTHORACIC ECHOCARDIOGRAM  04/2018; 09/2019   04/2018: EF 40-45%, mod diffuse LV hypokinesis, grd I DD, bioprosth AV well seated, no AS or AR. 09/2019 EF 60-65%, grd I DD, valves fine, including bioprosth AV  . VITRECTOMY     Social History   Socioeconomic History  . Marital status: Married    Spouse name: Not on file  . Number of children: 0  . Years of education: 8  . Highest education level: Not on file  Occupational History  . Occupation: Engineer, production    Comment: retired  Tobacco Use  . Smoking status: Never Smoker  . Smokeless tobacco: Never Used  Vaping Use  . Vaping Use: Never used  Substance and Sexual Activity  . Alcohol use: No    Alcohol/week: 0.0 standard drinks  . Drug use: No    Types: Oxycodone  . Sexual activity: Not Currently  Other Topics Concern  .  Not on file  Social History Narrative   HSG, [name] - BS, [name] - MS-engineering, 2 years on PhD - [place]. Married - '65 - 46yr/divorced; '76- 3 yrs/divorced; '92 . No children. 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 ACP/Living Will - Yes CPR; long-term Mechanical ventilation as long as he was able to cognate; ok for long term artificial nutrition. Precondition being able to cognate and not to have too much pain.    Social Determinants of HRadio broadcast assistant  Strain:   . Difficulty of Paying Living Expenses: Not on file  Food Insecurity:   . Worried About Charity fundraiser in the Last Year: Not on file  . Ran Out of Food in the Last Year: Not on file  Transportation Needs:   . Lack of Transportation (Medical): Not on file  . Lack of Transportation (Non-Medical): Not on file  Physical Activity:   . Days of Exercise per Week: Not on file  . Minutes of Exercise per Session: Not on file  Stress:   . Feeling of Stress : Not on file  Social Connections:   . Frequency of Communication with Friends and Family: Not on file  . Frequency of Social Gatherings with Friends and Family: Not on file  . Attends Religious Services: Not on file  . Active Member of Clubs or Organizations: Not on file  . Attends Archivist Meetings: Not on file  . Marital Status: Not on file   Family History  Problem Relation Age of Onset  . Hypertension Mother   . Coronary artery disease Mother   . Heart attack Mother   . Neuropathy Mother   . Pulmonary fibrosis Father        asbestosis   - negative except otherwise stated in the family history section Allergies  Allergen Reactions  . Brimonidine Tartrate Shortness Of Breath    Alphagan-Shortness of breath  . Brinzolamide Shortness Of Breath    AZOPT- Shortness of breath  . Latanoprost Shortness Of Breath    XALATAN- Shortness of breath  . Nucynta [Tapentadol] Shortness Of Breath  .  Sulfa Antibiotics Palpitations  . Timolol Maleate Shortness Of Breath and Other (See Comments)    TIMOPTIC- Aggravated asthma  . Diltiazem Swelling     leg swelling  . Rofecoxib Swelling     VIOXX- leg swelling  . Vancomycin Hives and Other (See Comments)    Possible "Red Man Syndrome"? > hives/blisters  . Codeine Other (See Comments)    Childhood reaction  . Tamsulosin Other (See Comments)    Dizziness   . Celecoxib Other (See Comments)    CELLBREX-confusion  . Colchicine Diarrhea    diarrhea  . Tape Rash   Prior to Admission medications   Medication Sig Start Date End Date Taking? Authorizing Provider  albuterol (PROVENTIL HFA;VENTOLIN HFA) 108 (90 BASE) MCG/ACT inhaler Inhale 2 puffs into the lungs 4 (four) times daily as needed for wheezing or shortness of breath.   Yes [provider]  allopurinol (ZYLOPRIM) 300 MG tablet TAKE 1 TABLET BY MOUTH ONCE DAILY WITH FOOD 12/09/19  Yes McGowen, Adrian Blackwater, MD  amoxicillin (AMOXIL) 500 MG capsule Take 1 capsule (500 mg total) by mouth 3 (three) times daily. 12/22/19  Yes Tommy Medal, [name], MD  apixaban (ELIQUIS) 5 MG TABS tablet Take 5 mg by mouth 2 (two) times daily.   Yes [provider]  ASPIRIN LOW DOSE 81 MG EC tablet Take 81 mg by mouth daily. 02/03/19  Yes [provider]  B Complex-C (B-COMPLEX WITH VITAMIN C) tablet Take 1 tablet by mouth daily. Take 1 tablet daily, 250m   Yes [provider]  ezetimibe (ZETIA) 10 MG tablet Take 1 tablet (10 mg total) by mouth daily. 12/16/19  Yes McGowen, PAdrian Blackwater MD  finasteride (PROSCAR) 5 MG tablet Take 1 tablet (5 mg total) by mouth daily. 12/10/19  Yes McGowen, PAdrian Blackwater MD  fluticasone (FLONASE) 50 MCG/ACT nasal spray Place 2 sprays  into both nostrils as needed for allergies or rhinitis.   Yes [provider]  gabapentin (NEURONTIN) 800 MG tablet TAKE 1 TABLET (800 MG TOTAL) BY MOUTH 4 (FOUR) TIMES DAILY. 01/14/20  Yes Kirsteins, Luanna Salk, MD    KLOR-CON M20 20 MEQ tablet Take 20 mEq by mouth daily. 03/07/20  Yes [provider]  metoprolol succinate (TOPROL-XL) 25 MG 24 hr tablet Take 25 mg by mouth daily. 12/17/19  Yes [provider]  Multiple Vitamin (MULTIVITAMIN ADULT PO) Take by mouth daily.   Yes [provider]  niacin (NIASPAN) 1000 MG CR tablet TAKE 1 TABLET BY MOUTH TWICE A DAY 04/26/20  Yes McGowen, Adrian Blackwater, MD  nitroGLYCERIN (NITROSTAT) 0.4 MG SL tablet Place 0.4 mg under the tongue every 5 (five) minutes as needed for chest pain.  09/04/17  Yes [provider]  pantoprazole (PROTONIX) 40 MG tablet TAKE 1 TABLET BY MOUTH EVERY DAY 01/09/20  Yes McGowen, Adrian Blackwater, MD  rosuvastatin (CRESTOR) 40 MG tablet take 1 tablet by mouth once daily Patient taking differently: Take 40 mg by mouth daily.  02/05/17  Yes Lelon Perla, MD  sacubitril-valsartan (ENTRESTO) 24-26 MG Take 1 tablet by mouth 2 (two) times daily.  07/19/17  Yes [provider]  torsemide (DEMADEX) 20 MG tablet Take 20 mg by mouth 2 (two) times daily as needed (swelling).    Yes [provider]  vitamin C (ASCORBIC ACID) 250 MG tablet Take 250 mg by mouth daily.   Yes [provider]  VITAMIN D, CHOLECALCIFEROL, PO Take 1 tablet by mouth daily.    Yes [provider]  Grant Ruts INHUB 250-50 MCG/DOSE AEPB TAKE 1 PUFF BY MOUTH TWICE A DAY 02/17/20  Yes McGowen, Adrian Blackwater, MD   No results found. - pertinent xrays, CT, MRI studies were reviewed and independently interpreted  Positive ROS: All other systems have been reviewed and were otherwise negative with the exception of those mentioned in the HPI and as above.  Physical Exam: General: Alert, no acute distress Psychiatric: Patient is competent for consent with normal mood and affect Lymphatic: No axillary or cervical lymphadenopathy Cardiovascular: No pedal edema Respiratory: No cyanosis, no use of accessory musculature GI: No organomegaly, abdomen  is soft and non-tender    Images:  @ENCIMAGES @  Labs:  Lab Results  Component Value Date   HGBA1C 6.1 (H) 04/29/2020   HGBA1C 5.6 10/02/2019   HGBA1C 6.1 03/27/2019   ESRSEDRATE 20 02/09/2016   ESRSEDRATE 36 (H) 01/28/2014   CRP 0.7 05/03/2020   CRP 1.0 (H) 05/02/2020   CRP 1.9 (H) 05/01/2020   LABURIC 3.5 (L) 09/10/2013   REPTSTATUS 05/04/2020 FINAL 04/28/2020   CULT  04/28/2020    NO GROWTH 5 DAYS Performed at [facility], [place] [address] [place], [place] [address]    LABORGA ENTEROCOCCUS FAECALIS 09/11/2019    Lab Results  Component Value Date   ALBUMIN 3.0 (L) 05/05/2020   ALBUMIN 2.8 (L) 05/04/2020   ALBUMIN 3.1 (L) 05/03/2020   LABURIC 3.5 (L) 09/10/2013    Neurologic: Patient does not have protective sensation bilateral lower extremities.   MUSCULOSKELETAL:   Skin: Examination patient has wraps on his legs for venous insufficiency.  The left transmetatarsal amputation shows a healing ulcer over the end of the residual foot.  This shows improvement since he has been off his foot with healthy granulation tissue the ulcer is 1 cm diameter 1 mm deep no exposed bone or tendon  no cellulitis no drainage no odor.  No ascending cellulitis proximal to the compression wraps.  Assessment: Assessment: Healing ulcer left transmetatarsal amputation with venous and lymphatic insufficiency bilateral lower extremities.  Plan: I opened up the compression wraps proximally patient was complaining of pain his left transmetatarsal ulcer continues to improve with him being off his feet.  I will follow-up in the office with routine follow-up.  Thank you for the consult and the opportunity to see Tim [name], [place] [phone] 8:03 AM

## 2020-05-05 NOTE — Progress Notes (Signed)
PROGRESS NOTE                                                                                                                                                                                                             Patient Demographics:    Tim [name], is a 79 y.o. male, DOB - January 28, 1941, FKC:[id number]  Outpatient Primary MD for the patient is McGowen, Adrian Blackwater, MD    LOS - 6  Admit date - 04/28/2020    Chief Complaint  Patient presents with  . Weakness       Brief Narrative (HPI from H&P)   Tim [name] is a 79 y.o. 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  Has received PICC line.   Subjective:  Patient seen and examined.  He has no complaint.   Assessment  & Plan :    1. 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 On chronic amoxicillin treatment under the care of Dr. Drucilla Schmidt ID. Now 1.3 x 1 cm mobile vegetation attached to the anterior mitral leaflet on TTE.  Now he seems to have new mitral valve endocarditis per TTE, ID  following, TEE failed on 05/03/2020 due to possible esophageal stricture, GI has done EGD on 05/04/2020 with normal-appearing esophagus however they tried to empirically stretch it so that TEE can be done, he is scheduled to have repeat TEE tomorrow.  Blood cultures drawn this admission so far negative, continue empiric IV Rocephin 2 g and ampicillin.  ID following for final duration course per ID.  PICC line placed 05/01/2020.  2. Paroxysmal A. fib. Chads vasc score 2 score of greater than 3.  Eliquis was held for EGD on 05/04/2020, resumed on 05/05/2020, continue beta-blocker.  3. Ischemic cardiomyopathy with chronic systolic heart failure EF around 40%. He has chronic 2+ leg edema, pressure is improved low-dose beta-blocker, diuretics as tolerated by blood pressure and renal function, he had a brief episode of chest pain on 05/01/2020 with unchanged EKG, added Imdur, undergoing TEE soon, Cardiology on board due to few episodes of chest pain.  He has been on Entresto since 05/02/2020.  4. Anemia of chronic disease. Stable follow.  5. COPD. Stable no wheezing.  6. BPH.  Continue alpha-blocker  7.  Hypomagnesemia and hypokalemia.  Resolved.  8.  AKI.  Resolved  9.  Possible esophageal stricture noted on TEE.  GI consulted by cardiology.  Status post EGD which did not show stricture however GI empirically stressed out esophagus to allow smooth TEE.  10.Thrombocytopenia. Likely due to infection monitor.  Improving already.  Lab Results  Component Value Date   PLT 136 (L) 05/05/2020    11. Prediabetes. Monitor. Add ISS as he stress-induced hyperglycemia.  Lab Results  Component Value Date   HGBA1C 6.1 (H) 04/29/2020   CBG (last 3)  Recent Labs    05/04/20 2138 05/05/20 0615 05/05/20 1117  GLUCAP 121* 118* 163*   Lab Results  Component Value Date   TSH 0.905 04/29/2020   Left stump wound: Seen by Dr. Sharol Given.  Follow-up outpatient.   Condition - Extremely Guarded  Family  Communication  : None.  Patient alert and oriented.  Plan discussed with him.  Code Status : DNR  Consults  :  ID, cardiology, GI  Procedures  :    EGD 05/04/2020.  Normal esophagus with some gastric polyps.    PICC line placed 05/01/2020.   CT L Foot - no abscess  TTE - 1. Left ventricular ejection fraction, by estimation, is 55 to 60%. The left ventricle has normal function. The left ventricle has no regional wall motion abnormalities. Left ventricular diastolic parameters are consistent with Grade I diastolic dysfunction (impaired relaxation).  2. Right ventricular systolic function is normal. The right ventricular size is mildly enlarged. Tricuspid regurgitation signal is inadequate for assessing PA pressure.  3. Left atrial size was mildly dilated.  4. The mitral valve is degenerative. No evidence of mitral valve regurgitation. Mild mitral stenosis. The mean mitral valve gradient is 5.0 mmHg. Moderate mitral annular calcification. There is a 1.3 x 1 cm mobile vegetation attached to the anterior mitral leaflet.  5. The aortic valve was not well visualized. Aortic valve regurgitation is not visualized. Mild aortic valve stenosis. Aortic valve mean gradient measures 16.0 mmHg.  6. The inferior vena cava is normal in size with greater than 50% respiratory variability, suggesting right atrial pressure of 3 mmHg.  7. Technically difficult study with very poor windows. Possible mitral valve vegetation. The tricuspid valve, aortic valve, and ICD were poorly visualized  PUD Prophylaxis : PPI  Disposition Plan  :    Status is: Inpatient  Remains inpatient appropriate because:IV treatments appropriate due to intensity of illness or inability to take PO   Dispo: The patient is from: Home              Anticipated d/c is to: Home              Anticipated d/c date is: > 3 days  Patient currently is not medically stable to d/c.  DVT Prophylaxis  : Eliquis >> SCDs  Lab Results    Component Value Date   PLT 136 (L) 05/05/2020    Diet :  Diet Order            Diet NPO time specified Except for: Sips with Meds  Diet effective midnight           DIET SOFT Room service appropriate? Yes; Fluid consistency: Thin  Diet effective now                  Inpatient Medications  Scheduled Meds: . allopurinol  100 mg Oral BID  . apixaban  5 mg Oral BID  . Chlorhexidine Gluconate Cloth  6 each Topical Daily  . ezetimibe  10 mg Oral Daily  . finasteride  5 mg Oral Daily  . fluticasone  1 spray Each Nare Daily  . gabapentin  800 mg Oral QID  . insulin aspart  0-9 Units Subcutaneous TID WC  . isosorbide mononitrate  30 mg Oral Daily  . magnesium oxide  400 mg Oral Daily  . metoprolol succinate  25 mg Oral Daily  . mometasone-formoterol  2 puff Inhalation BID  .  morphine injection  2 mg Intravenous Once  . pantoprazole  40 mg Oral Q1200  . rosuvastatin  40 mg Oral QHS  . sacubitril-valsartan  1 tablet Oral BID  . sodium chloride flush  10-40 mL Intracatheter Q12H  . spironolactone  12.5 mg Oral Daily  . torsemide  20 mg Oral Daily   Continuous Infusions: . ampicillin (OMNIPEN) IV 2 g (05/05/20 1151)  . cefTRIAXone (ROCEPHIN)  IV 2 g (05/05/20 1044)   PRN Meds:.albuterol, nitroGLYCERIN, sodium chloride, sodium chloride flush  Antibiotics  :    Anti-infectives (From admission, onward)   Start     Dose/Rate Route Frequency Ordered Stop   04/29/20 2200  cefTRIAXone (ROCEPHIN) 2 g in sodium chloride 0.9 % 100 mL IVPB  Status:  Discontinued        2 g 200 mL/hr over 30 Minutes Intravenous Every 24 hours 04/29/20 0013 04/29/20 1206   04/29/20 1600  ampicillin (OMNIPEN) 2 g in sodium chloride 0.9 % 100 mL IVPB        2 g 300 mL/hr over 20 Minutes Intravenous Every 4 hours 04/29/20 1206     04/29/20 1300  cefTRIAXone (ROCEPHIN) 2 g in sodium chloride 0.9 % 100 mL IVPB        2 g 200 mL/hr over 30 Minutes Intravenous Every 12 hours 04/29/20 1206     04/29/20  0600  ampicillin (OMNIPEN) 2 g in sodium chloride 0.9 % 100 mL IVPB  Status:  Discontinued        2 g 300 mL/hr over 20 Minutes Intravenous Every 6 hours 04/29/20 0013 04/29/20 1206   04/28/20 2215  cefTRIAXone (ROCEPHIN) 1 g in sodium chloride 0.9 % 100 mL IVPB  Status:  Discontinued        1 g 200 mL/hr over 30 Minutes Intravenous  Once 04/28/20 2201 04/28/20 2211   04/28/20 2215  ampicillin (OMNIPEN) 2 g in sodium chloride 0.9 % 100 mL IVPB        2 g 300 mL/hr over 20 Minutes Intravenous  Once 04/28/20 2201 04/29/20 0027   04/28/20 2215  cefTRIAXone (ROCEPHIN) 2 g in sodium chloride 0.9 % 100 mL IVPB        2 g 200 mL/hr over  30 Minutes Intravenous  Once 04/28/20 2211 04/28/20 2314       Time Spent in minutes 35   Darliss Cheney M.D on 05/05/2020 at 2:02 PM  To page go to [email or web address] - password [facility]  [facility] -  Office  [phone]   See all Orders from today for further details     Objective:   Vitals:   05/05/20 0400 05/05/20 0745 05/05/20 0751 05/05/20 1143  BP: 137/62 (!) 146/70  125/75  Pulse: 72 75  67  Resp: 12 17  16   Temp: 97.8 F (36.6 C) 97.7 F (36.5 C)  98 F (36.7 C)  TempSrc: Oral Oral  Oral  SpO2: 95% 97% 98% 100%  Weight:      Height:        Wt Readings from Last 3 Encounters:  05/05/20 (!) 147.3 kg  04/21/20 (!) 141.5 kg  04/19/20 (!) 145.2 kg     Intake/Output Summary (Last 24 hours) at 05/05/2020 1402 Last data filed at 05/05/2020 0747 Gross per 24 hour  Intake 1100.08 ml  Output 2125 ml  Net -1024.92 ml     Physical Exam  General exam: Appears calm and comfortable  Respiratory system: Clear to auscultation. Respiratory effort normal. Cardiovascular system: S1 & S2 heard, RRR. No JVD, murmurs, rubs, gallops or clicks. No pedal edema. Gastrointestinal system: Abdomen is nondistended, soft and nontender. No organomegaly or masses felt. Normal bowel sounds heard. Central nervous system: Alert and oriented. No focal  neurological deficits. Extremities: Left foot transmetatarsal amputation with plantar surface shallow ulcer without signs of infection. Skin: No rashes, lesions or ulcers.  Psychiatry: Judgement and insight appear normal. Mood & affect appropriate.     Data Review:    CBC Recent Labs  Lab 05/01/20 0319 05/02/20 0241 05/03/20 0350 05/04/20 0357 05/05/20 0418  WBC 5.7 5.6 6.3 5.0 6.4  HGB 9.9* 9.3* 10.7* 10.0* 10.7*  HCT 30.7* 28.9* 33.3* 30.8* 32.7*  PLT 98* 104* 130* 120* 136*  MCV 89.2 90.3 91.7 91.7 91.3  MCH 28.8 29.1 29.5 29.8 29.9  MCHC 32.2 32.2 32.1 32.5 32.7  RDW 14.6 14.5 14.5 14.2 13.9  LYMPHSABS 1.3 1.3 1.7 1.4 1.7  MONOABS 0.8 0.7 0.6 0.6 0.6  EOSABS 0.2 0.2 0.3 0.2 0.3  BASOSABS 0.0 0.0 0.1 0.0 0.1    Recent Labs  Lab 04/28/20 2118 04/28/20 2133 04/28/20 2338 04/29/20 0759 04/29/20 0759 04/29/20 0801 04/29/20 0828 04/30/20 0202 04/30/20 0202 05/01/20 0319 05/02/20 0241 05/03/20 0350 05/04/20 0357 05/05/20 0418 05/05/20 0419  NA   < >  --   --  141   < >  --   --  140   < > 140 142 141 141 142  --   K   < >  --   --  4.0   < >  --   --  4.0   < > 3.7 3.5 3.8 3.2* 3.7  --   CL   < >  --   --  110   < >  --   --  109   < > 105 103 105 103 104  --   CO2   < >  --   --  23   < >  --   --  25   < > 26 29 27 28 28   --   GLUCOSE   < >  --   --  125*   < >  --   --  127*   < > 121* 123* 135* 115* 122*  --   BUN   < >  --   --  22   < >  --   --  18   < > 21 18 12 11 10   --   CREATININE   < >  --   --  1.09   < >  --   --  0.90   < > 1.27* 1.15 0.90 0.97 1.05  --   CALCIUM   < >  --   --  8.3*   < >  --   --  8.6*   < > 8.5* 8.5* 8.7* 8.2* 8.7*  --   AST  --   --   --  26   < >  --   --  35   < > 28 25 30 26 23   --   ALT  --   --   --  21   < >  --   --  23   < > 23 21 25 23 22   --   ALKPHOS  --   --   --  48   < >  --   --  48   < > 44 43 52 47 47  --   BILITOT  --   --   --  0.8   < >  --   --  0.7   < > 0.5 0.7 0.6 0.7 0.5  --   ALBUMIN  --   --   --   2.7*   < >  --   --  2.8*   < > 2.8* 2.8* 3.1* 2.8* 3.0*  --   MG  --   --   --  1.4*   < >  --   --  1.6*   < > 1.6* 1.8 2.0 1.6* 1.8  --   CRP  --   --   --   --   --   --   --  4.0*  --  1.9* 1.0* 0.7  --   --   --   DDIMER  --   --   --  1.33*  --   --   --  0.93*  --  0.79* 0.67* 1.30*  --   --   --   PROCALCITON  --   --   --  0.77   < >  --   --  0.55   < > 0.43 0.24 <0.10 <0.10 <0.10  --   LATICACIDVEN  --  1.8 2.0*  --   --  1.3  --   --   --   --   --   --   --   --   --   TSH  --   --   --   --   --   --  0.905  --   --   --   --   --   --   --   --   HGBA1C  --   --   --  6.1*  --   --   --   --   --   --   --   --   --   --   --   BNP  --   --   --  82.7   < >  --   --  82.6   < >  94.0 70.3 105.3* 64.1  --  87.1   < > = values in this interval not displayed.    ------------------------------------------------------------------------------------------------------------------ No results for input(s): CHOL, HDL, LDLCALC, TRIG, CHOLHDL, LDLDIRECT in the last 72 hours.  Lab Results  Component Value Date   HGBA1C 6.1 (H) 04/29/2020   ------------------------------------------------------------------------------------------------------------------ No results for input(s): TSH, T4TOTAL, T3FREE, THYROIDAB in the last 72 hours.  Invalid input(s): FREET3  Cardiac Enzymes No results for input(s): CKMB, TROPONINI, MYOGLOBIN in the last 168 hours.  Invalid input(s): CK ------------------------------------------------------------------------------------------------------------------    Component Value Date/Time   BNP 87.1 05/05/2020 0419   BNP 32.0 01/14/2014 1656    Micro Results Recent Results (from the past 240 hour(s))  Respiratory Panel by RT PCR (Flu A&B, Covid) - Nasopharyngeal Swab     Status: None   Collection Time: 04/28/20 10:43 PM   Specimen: Nasopharyngeal Swab  Result Value Ref Range Status   SARS Coronavirus 2 by RT PCR NEGATIVE NEGATIVE Final    Comment:  (NOTE) SARS-CoV-2 target nucleic acids are NOT DETECTED.  The SARS-CoV-2 RNA is generally detectable in upper respiratoy specimens during the acute phase of infection. The lowest concentration of SARS-CoV-2 viral copies this assay can detect is 131 copies/mL. A negative result does not preclude SARS-Cov-2 infection and should not be used as the sole basis for treatment or other patient management decisions. A negative result may occur with  improper specimen collection/handling, submission of specimen other than nasopharyngeal swab, presence of viral mutation(s) within the areas targeted by this assay, and inadequate number of viral copies (<131 copies/mL). A negative result must be combined with clinical observations, patient history, and epidemiological information. The expected result is Negative.  Fact Sheet for Patients:  PinkCheek.be  Fact Sheet for Healthcare Providers:  GravelBags.it  This test is no t yet approved or cleared by the [place] FDA and  has been authorized for detection and/or diagnosis of SARS-CoV-2 by FDA under an Emergency Use Authorization (EUA). This EUA will remain  in effect (meaning this test can be used) for the duration of the COVID-19 declaration under Section 564(b)(1) of the Act, 21 U.S.C. section 360bbb-3(b)(1), unless the authorization is terminated or revoked sooner.     Influenza A by PCR NEGATIVE NEGATIVE Final   Influenza B by PCR NEGATIVE NEGATIVE Final    Comment: (NOTE) The Xpert Xpress SARS-CoV-2/FLU/RSV assay is intended as an aid in  the diagnosis of influenza from Nasopharyngeal swab specimens and  should not be used as a sole basis for treatment. Nasal washings and  aspirates are unacceptable for Xpert Xpress SARS-CoV-2/FLU/RSV  testing.  Fact Sheet for Patients: PinkCheek.be  Fact Sheet for Healthcare  Providers: GravelBags.it  This test is not yet approved or cleared by the [place] FDA and  has been authorized for detection and/or diagnosis of SARS-CoV-2 by  FDA under an Emergency Use Authorization (EUA). This EUA will remain  in effect (meaning this test can be used) for the duration of the  Covid-19 declaration under Section 564(b)(1) of the Act, 21  U.S.C. section 360bbb-3(b)(1), unless the authorization is  terminated or revoked. Performed at [facility], [place] [address] [place], [place] [address]   Blood culture (routine x 2)     Status: None   Collection Time: 04/28/20 11:38 PM   Specimen: BLOOD RIGHT FOREARM  Result Value Ref Range Status   Specimen Description BLOOD RIGHT FOREARM  Final   Special Requests   Final  BOTTLES DRAWN AEROBIC AND ANAEROBIC Blood Culture results may not be optimal due to an inadequate volume of blood received in culture bottles   Culture   Final    NO GROWTH 5 DAYS Performed at [facility], [place] [address], [place], [place] [address]    Report Status 05/04/2020 FINAL  Final  Blood culture (routine x 2)     Status: None   Collection Time: 04/28/20 11:50 PM   Specimen: BLOOD LEFT HAND  Result Value Ref Range Status   Specimen Description BLOOD LEFT HAND  Final   Special Requests   Final    BOTTLES DRAWN AEROBIC AND ANAEROBIC Blood Culture results may not be optimal due to an inadequate volume of blood received in culture bottles   Culture   Final    NO GROWTH 5 DAYS Performed at [facility], [place] [address] [place],  [address]    Report Status 05/04/2020 FINAL  Final    Radiology Reports CT FOOT LEFT WO CONTRAST  Result Date: 04/29/2020 CLINICAL DATA:  Question of osteomyelitis EXAM: CT OF THE LEFT FOOT WITHOUT CONTRAST TECHNIQUE: Multidetector CT imaging of the left foot was performed according to the standard protocol. Multiplanar CT image reconstructions were also generated.  COMPARISON:  None. FINDINGS: Bones/Joint/Cartilage The patient is status post transmetatarsal first through fifth digit amputation. There is no definite areas of cortical destruction or periosteal reaction noted. The amputation sites appear to be grossly intact. 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 Calcaneal enthesophyte seen on the plantar surface. There is diffuse osteopenia. Ligaments Suboptimally assessed by CT. Muscles and Tendons There is diffuse fatty atrophy of the muscles surrounding the forefoot. The flexor extensor tendons appear to be grossly intact. The plantar fascia appears to be intact. Soft tissues Overlying the plantar the near the first metatarsal amputation site there is a focal area of superficial ulceration with diffuse skin thickening and subcutaneous edema. No definite loculated fluid collection or subcutaneous emphysema is seen. IMPRESSION: Superficial ulceration along the plantar base beneath the first metatarsal amputation site with cellulitis and postsurgical changes. No definite loculated fluid collection or subcutaneous emphysema. Status post transmetatarsal amputation of the first through fifth digits without definite evidence of osteomyelitis. Electronically Signed   By: Prudencio Pair M.D.   On: 04/29/2020 03:30   DG Chest Port 1 View  Result Date: 04/28/2020 CLINICAL DATA:  Chest pain, weakness and dizziness. EXAM: PORTABLE CHEST 1 VIEW COMPARISON:  01/20/2020 FINDINGS: Mild patient rotation. Multi lead left-sided pacemaker. Cardiomegaly slightly increased from prior exam. Unchanged mediastinal contours. Aortic atherosclerosis. Mild vascular engorgement. No pulmonary edema. No focal airspace disease. No large pleural effusion. No pneumothorax. The bones appear under mineralized. Spinal stimulator in place. Multiple overlying monitoring devices. No acute osseous abnormalities are seen. IMPRESSION: Cardiomegaly with mild vascular congestion.  Electronically Signed   By: Keith Rake M.D.   On: 04/28/2020 21:40   ECHOCARDIOGRAM COMPLETE  Result Date: 04/29/2020    ECHOCARDIOGRAM REPORT   Patient Name:   KHARON HIXON Date of Exam: 04/29/2020 Medical Rec #:  [id number]       Height:       72.0 in Accession #:    [id number]      Weight:       320.0 lb Date of Birth:  1941/02/15       BSA:          2.602 m Patient Age:    12 years  BP:           127/96 mmHg Patient Gender: M               HR:           66 bpm. Exam Location:  Inpatient Procedure: 2D Echo and Intracardiac Opacification Agent Indications:    endocarditis  History:        Patient has prior history of Echocardiogram examinations, most                 recent 01/16/2020. CAD, Defibrillator, COPD,                 Signs/Symptoms:Fever; Risk Factors:Sleep Apnea.  Sonographer:    Johny Chess Referring Phys: Graylin Shiver [facility]  Sonographer Comments: Suboptimal parasternal window and patient is morbidly obese. Image acquisition challenging due to patient body habitus. IMPRESSIONS  1. Left ventricular ejection fraction, by estimation, is 55 to 60%. The left ventricle has normal function. The left ventricle has no regional wall motion abnormalities. Left ventricular diastolic parameters are consistent with Grade I diastolic dysfunction (impaired relaxation).  2. Right ventricular systolic function is normal. The right ventricular size is mildly enlarged. Tricuspid regurgitation signal is inadequate for assessing PA pressure.  3. Left atrial size was mildly dilated.  4. The mitral valve is degenerative. No evidence of mitral valve regurgitation. Mild mitral stenosis. The mean mitral valve gradient is 5.0 mmHg. Moderate mitral annular calcification. There is a 1.3 x 1 cm mobile vegetation attached to the anterior mitral leaflet.  5. The aortic valve was not well visualized. Aortic valve regurgitation is not visualized. Mild aortic valve stenosis. Aortic valve mean gradient measures  16.0 mmHg.  6. The inferior vena cava is normal in size with greater than 50% respiratory variability, suggesting right atrial pressure of 3 mmHg.  7. Technically difficult study with very poor windows. Possible mitral valve vegetation. The tricuspid valve, aortic valve, and ICD were poorly visualized. Suggest TEE. FINDINGS  Left Ventricle: Left ventricular ejection fraction, by estimation, is 55 to 60%. The left ventricle has normal function. The left ventricle has no regional wall motion abnormalities. Definity contrast agent was given IV to delineate the left ventricular  endocardial borders. The left ventricular internal cavity size was normal in size. There is no left ventricular hypertrophy. Left ventricular diastolic parameters are consistent with Grade I diastolic dysfunction (impaired relaxation). Right Ventricle: The right ventricular size is mildly enlarged. No increase in right ventricular wall thickness. Right ventricular systolic function is normal. Tricuspid regurgitation signal is inadequate for assessing PA pressure. Left Atrium: Left atrial size was mildly dilated. Right Atrium: Right atrial size was normal in size. Pericardium: There is no evidence of pericardial effusion. Mitral Valve: The mitral valve is degenerative in appearance. There is moderate thickening of the mitral valve leaflet(s). Moderate mitral annular calcification. No evidence of mitral valve regurgitation. Mild mitral valve stenosis. MV peak gradient, 10.1 mmHg. The mean mitral valve gradient is 5.0 mmHg. Tricuspid Valve: The tricuspid valve is not well visualized. Tricuspid valve regurgitation is not demonstrated. Aortic Valve: The aortic valve was not well visualized. Aortic valve regurgitation is not visualized. Mild aortic stenosis is present. Aortic valve mean gradient measures 16.0 mmHg. Aortic valve peak gradient measures 30.9 mmHg. Pulmonic Valve: The pulmonic valve was not well visualized. Pulmonic valve regurgitation is  not visualized. Aorta: Aortic root could not be assessed. Venous: The inferior vena cava is normal in size with greater than 50% respiratory variability,  suggesting right atrial pressure of 3 mmHg. IAS/Shunts: No atrial level shunt detected by color flow Doppler. Additional Comments: A pacer wire is visualized in the right ventricle.   Diastology LV e' medial:    6.74 cm/s LV E/e' medial:  18.4 LV e' lateral:   8.05 cm/s LV E/e' lateral: 15.4  RIGHT VENTRICLE             IVC RV S prime:     15.60 cm/s  IVC diam: 1.70 cm TAPSE (M-mode): 3.0 cm LEFT ATRIUM             Index LA Vol (A2C):   78.1 ml 30.02 ml/m LA Vol (A4C):   90.2 ml 34.67 ml/m LA Biplane Vol: 91.5 ml 35.17 ml/m  AORTIC VALVE AV Vmax:           278.00 cm/s AV Vmean:          182.000 cm/s AV VTI:            0.576 m AV Peak Grad:      30.9 mmHg AV Mean Grad:      16.0 mmHg LVOT Vmax:         119.00 cm/s LVOT Vmean:        81.800 cm/s LVOT VTI:          0.236 m LVOT/AV VTI ratio: 0.41 MITRAL VALVE MV Area (PHT): 2.05 cm     SHUNTS MV Peak grad:  10.1 mmHg    Systemic VTI: 0.24 m MV Mean grad:  5.0 mmHg MV Vmax:       1.59 m/s MV Vmean:      100.0 cm/s MV Decel Time: 370 msec MV E velocity: 124.00 cm/s MV A velocity: 132.00 cm/s MV E/A ratio:  0.94 Loralie Champagne MD Electronically signed by Loralie Champagne MD Signature Date/Time: 04/29/2020/6:03:22 PM    Final    [name] EKG SITE RITE  Result Date: 04/30/2020 If Site Rite image not attached, placement could not be confirmed due to current cardiac rhythm.

## 2020-05-05 NOTE — Progress Notes (Addendum)
Progress Note  Patient Name: Tim [name] Date of Encounter: 05/05/2020  [facility] Cardiologist: Kirk Ruths, MD   Subjective   No complaints today. Pt states leg wraps are helping his edema. Breathing at baseline.  Inpatient Medications    Scheduled Meds:  allopurinol  100 mg Oral BID   apixaban  5 mg Oral BID   Chlorhexidine Gluconate Cloth  6 each Topical Daily   ezetimibe  10 mg Oral Daily   finasteride  5 mg Oral Daily   fluticasone  1 spray Each Nare Daily   gabapentin  800 mg Oral QID   insulin aspart  0-9 Units Subcutaneous TID WC   isosorbide mononitrate  30 mg Oral Daily   magnesium oxide  400 mg Oral Daily   metoprolol succinate  25 mg Oral Daily   mometasone-formoterol  2 puff Inhalation BID    morphine injection  2 mg Intravenous Once   pantoprazole  40 mg Oral Q1200   rosuvastatin  40 mg Oral QHS   sacubitril-valsartan  1 tablet Oral BID   sodium chloride flush  10-40 mL Intracatheter Q12H   spironolactone  12.5 mg Oral Daily   torsemide  20 mg Oral Daily   Continuous Infusions:  ampicillin (OMNIPEN) IV Stopped (05/05/20 0435)   cefTRIAXone (ROCEPHIN)  IV Stopped (05/04/20 2158)   PRN Meds: albuterol, nitroGLYCERIN, sodium chloride, sodium chloride flush   Vital Signs    Vitals:   05/04/20 2118 05/04/20 2356 05/05/20 0358 05/05/20 0400  BP:  (!) 123/44  137/62  Pulse: 72 67  72  Resp: 15 17  12   Temp:  98 F (36.7 C)  97.8 F (36.6 C)  TempSrc:  Oral  Oral  SpO2: 97% 95%  95%  Weight:   (!) 147.3 kg   Height:        Intake/Output Summary (Last 24 hours) at 05/05/2020 0737 Last data filed at 05/05/2020 0600 Gross per 24 hour  Intake 1400.08 ml  Output 1825 ml  Net -424.92 ml   Last 3 Weights 05/05/2020 05/04/2020 05/03/2020  Weight (lbs) 324 lb 11.8 oz 321 lb 14 oz 327 lb 11.2 oz  Weight (kg) 147.3 kg 146 kg 148.644 kg      Telemetry    Paced rhythm - Personally Reviewed  ECG    No new tracings - Personally  Reviewed  Physical Exam   GEN: obese male in no acute distress.   Neck: No JVD Cardiac: RRR, 2/6 systolic murmur.  Respiratory: Clear to auscultation bilaterally. GI: Soft, nontender, non-distended  MS: legs wrapped Neuro:  Nonfocal  Psych: Normal affect   Labs    High Sensitivity Troponin:   Recent Labs  Lab 04/28/20 2118 04/28/20 2336 04/29/20 0013 04/29/20 0759 05/01/20 1748  TROPONINIHS 11 16 15 15 8       Chemistry Recent Labs  Lab 05/03/20 0350 05/04/20 0357 05/05/20 0418  NA 141 141 142  K 3.8 3.2* 3.7  CL 105 103 104  CO2 27 28 28   GLUCOSE 135* 115* 122*  BUN 12 11 10   CREATININE 0.90 0.97 1.05  CALCIUM 8.7* 8.2* 8.7*  PROT 6.3* 5.6* 6.2*  ALBUMIN 3.1* 2.8* 3.0*  AST 30 26 23   ALT 25 23 22   ALKPHOS 52 47 47  BILITOT 0.6 0.7 0.5  GFRNONAA >60 >60 >60  ANIONGAP 9 10 10      Hematology Recent Labs  Lab 05/03/20 0350 05/04/20 0357 05/05/20 0418  WBC 6.3 5.0 6.4  RBC  3.63* 3.36* 3.58*  HGB 10.7* 10.0* 10.7*  HCT 33.3* 30.8* 32.7*  MCV 91.7 91.7 91.3  MCH 29.5 29.8 29.9  MCHC 32.1 32.5 32.7  RDW 14.5 14.2 13.9  PLT 130* 120* 136*    BNP Recent Labs  Lab 05/03/20 0350 05/04/20 0357 05/05/20 0419  BNP 105.3* 64.1 87.1     DDimer  Recent Labs  Lab 05/01/20 0319 05/02/20 0241 05/03/20 0350  DDIMER 0.79* 0.67* 1.30*     Radiology    No results found.  Cardiac Studies   Echo 04/29/20: 1. Left ventricular ejection fraction, by estimation, is 55 to 60%. The  left ventricle has normal function. The left ventricle has no regional  wall motion abnormalities. Left ventricular diastolic parameters are  consistent with Grade I diastolic  dysfunction (impaired relaxation).   2. Right ventricular systolic function is normal. The right ventricular  size is mildly enlarged. Tricuspid regurgitation signal is inadequate for  assessing PA pressure.   3. Left atrial size was mildly dilated.   4. The mitral valve is degenerative. No  evidence of mitral valve  regurgitation. Mild mitral stenosis. The mean mitral valve gradient is 5.0  mmHg. Moderate mitral annular calcification. There is a 1.3 x 1 cm mobile  vegetation attached to the anterior  mitral leaflet.   5. The aortic valve was not well visualized. Aortic valve regurgitation  is not visualized. Mild aortic valve stenosis. Aortic valve mean gradient  measures 16.0 mmHg.   6. The inferior vena cava is normal in size with greater than 50%  respiratory variability, suggesting right atrial pressure of 3 mmHg.   7. Technically difficult study with very poor windows. Possible mitral  valve vegetation. The tricuspid valve, aortic valve, and ICD were poorly  visualized. Suggest TEE.   Patient Profile     79 y.o. 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 Unable to complete TEE 05/03/20 due to obstruction. GI consulted, underwent EGD with dilation 05/04/20.  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 ([employer]tronic) involvement CHB - PPM dependent Known PFO Prior osteomyelitis - TEE 12/02/19 with vegetation on TAVR valve and RV pacer lead - TTE this admission with poor windows - TEE attempted 05/03/20 but unable to pass the probe s/ obstruction - GI consulted, EGD and dilation 05/04/20 - ICD interrogated:  ERI 3.5 years, underlying rhythm is CHB, pt is PPM dependent - pt is not a candidate for sternotomy or laser lead extraction - will attempt TEE again tomorrow - NPO at [place]   Ischemic cardiomyopathy BiV ICD in place Chronic systolic and diastolic heart failure DM HTN - optivol showed increased thoracic impedence - pt states breathing is at baseline - BNP 70 --> 105 --> 87 - continue low dose entresto - BP has been well-controlled - continue BB, imdur, entresto, spiro,  20 mg torsemide - continue statin and zetia - patient appears near euvolemic   Paroxysmal atrial fibrillation Chronic anticoagulation This patients CHA2DS2-VASc Score and unadjusted Ischemic Stroke Rate (% per year) is equal to 9.7 % stroke rate/year from a score of 6 (2age, HTN, DM, CHF, CAD) - continue eliquis and BB      For questions or updates, please contact [place] Please consult [email or web address] for contact info under        Signed, Ledora Bottcher, PA  05/05/2020, 7:37 AM    Personally seen and examined. Agree with APP above with the following comments:  3 M with history of CAD, ICM, CHB s/p BiV ICD (medtronic) Severe AS s/p TAVR who has had Infective endocarditis of TAVR and RV Paced lead recently with medical management only secondary to operative risks.  Query of mitral valve vegetation getting IV Abx presently.  Discussed risk and benefits of empiric therapy vs repeat TEE (given prior issues unable to pass probe.  Will attempted TEE 05/06/20.  If unable to pass probe would recommend empiric therapy.  Continuing GDMT for CAD and HF; on AC for PAF (presently in sinus).  Werner Lean, MD

## 2020-05-05 NOTE — Progress Notes (Signed)
Physical Therapy Treatment Patient Details Name: Tim [name] MRN: [id number] DOB: [birth year] Today's Date: 05/05/2020    History of Present Illness 79 y.o. 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    PT Comments    Pt seated in recliner.  Pt motivated to progress OOB activity.  He continues to require frequent trips to the bathroom.  Pt able to perform stair training this session with decreased WOB.  He did require x1 seated rest break.  Continue to recommend no PT follow up.    Follow Up Recommendations  No PT follow up;Supervision - Intermittent     Equipment Recommendations  None recommended by PT    Recommendations for Other Services       Precautions / Restrictions Precautions Precautions: Fall Restrictions Weight Bearing Restrictions: No    Mobility  Bed Mobility               General bed mobility comments: Pt seated in recliner chair on arrival.  Transfers Overall transfer level: Needs assistance Equipment used: 4-wheeled walker Transfers: Sit to/from Stand Sit to Stand: Supervision         General transfer comment: For safety.  Ambulation/Gait Ambulation/Gait assistance: Supervision Gait Distance (Feet): 100 Feet (x2 with seated rest break between trials.) Assistive device:  4-wheeled walker (wide) Gait Pattern/deviations: Step-through pattern;Trunk flexed;Shuffle Gait velocity: reduced   General Gait Details: Pt with kyphotic posturing at baseline.  Pt fatigues with activity and required seated rest break between trials.   Stairs Stairs: Yes Stairs assistance: Supervision Stair Management: Two rails;Forwards;Backwards;Step to pattern Number of Stairs: 4 General stair comments: Cues for sequencing and hand placement on rails.  Forward to ascend and backwards to descend.  Required decreased assistance this session.   Wheelchair Mobility    Modified Rankin (Stroke Patients Only)       Balance Overall balance assessment: Needs assistance Sitting-balance support: No upper extremity supported;Feet supported Sitting balance-Leahy Scale: Good Sitting balance - Comments: supervision     Standing balance-Leahy Scale: Fair Standing balance comment: reliant on UE support of assistive device                            Cognition Arousal/Alertness: Awake/alert Behavior During Therapy: WFL for tasks assessed/performed Overall Cognitive Status: Within Functional Limits for tasks assessed                                        Exercises      General Comments        Pertinent Vitals/Pain Pain Assessment: Faces Faces Pain Scale: Hurts a little bit Pain Location: back, chronic Pain Descriptors / Indicators: Aching Pain Intervention(s): Monitored during session    Home Living  Prior Function            PT Goals (current goals can now be found in the care plan section) Acute Rehab PT Goals Patient Stated Goal: To return home and regain mobility at [facility] level. Potential to Achieve Goals: Good Progress towards PT goals: Progressing toward goals    Frequency    Min 3X/week      PT Plan Current plan remains appropriate    Co-evaluation              AM-PAC PT "6 Clicks"  Mobility   Outcome Measure  Help needed turning from your back to your side while in a flat bed without using bedrails?: None Help needed moving from lying on your back to sitting on the side of a flat bed without using bedrails?: None Help needed moving to and from a bed to a chair (including a wheelchair)?: A Little Help needed standing up from a chair using your arms (e.g., wheelchair or bedside chair)?: A Little Help needed to walk in hospital room?: A Little Help needed climbing 3-5 steps with a railing? : A Little 6 Click Score: 20    End of Session Equipment Utilized During Treatment: Gait belt Activity Tolerance: Patient tolerated treatment well Patient left: with call bell/phone within reach;with family/visitor present;in chair Nurse Communication: Mobility status PT Visit Diagnosis: Other abnormalities of gait and mobility (R26.89);Muscle weakness (generalized) (M62.81)     Time: 4128-7867 PT Time Calculation (min) (ACUTE ONLY): 28 min  Charges:  $Gait Training: 8-22 mins $Therapeutic Activity: 8-22 mins                     Tim [name] , PTA Acute Rehabilitation Services Pager [phone] Office [phone]     Tim [name] 05/05/2020, 12:40 PM

## 2020-05-05 NOTE — Progress Notes (Signed)
Mobility Specialist - Progress Note   05/05/20 1432  Mobility  Activity Ambulated in hall  Level of Assistance Standby assist, set-up cues, supervision of patient - no hands on  Assistive Device Four wheel walker  Distance Ambulated (ft) 170 ft  Mobility Response Tolerated well  Mobility performed by Mobility specialist  $Mobility charge 1 Mobility    Pre-mobility: 74 HR During mobility: 98 HR Post-mobility: 68 HR  Pt asx throughout ambulation.   Pricilla Handler Mobility Specialist Mobility Specialist Phone: [phone]

## 2020-05-06 ENCOUNTER — Inpatient Hospital Stay (HOSPITAL_COMMUNITY): Payer: Medicare Other

## 2020-05-06 ENCOUNTER — Encounter (HOSPITAL_COMMUNITY)
Admission: EM | Disposition: A | Discharge status: Home Health | Payer: Self-pay | Source: Home / Self Care | Attending: Internal Medicine

## 2020-05-06 ENCOUNTER — Ambulatory Visit: Payer: Medicare Other | Admitting: Family Medicine

## 2020-05-06 ENCOUNTER — Inpatient Hospital Stay (HOSPITAL_COMMUNITY): Payer: Medicare Other | Admitting: Anesthesiology

## 2020-05-06 ENCOUNTER — Encounter (HOSPITAL_COMMUNITY): Payer: Self-pay | Admitting: Internal Medicine

## 2020-05-06 DIAGNOSIS — T827XXD Infection and inflammatory reaction due to other cardiac and vascular devices, implants and grafts, subsequent encounter: Secondary | ICD-10-CM | POA: Diagnosis not present

## 2020-05-06 DIAGNOSIS — I39 Endocarditis and heart valve disorders in diseases classified elsewhere: Secondary | ICD-10-CM | POA: Diagnosis not present

## 2020-05-06 DIAGNOSIS — I330 Acute and subacute infective endocarditis: Secondary | ICD-10-CM | POA: Diagnosis not present

## 2020-05-06 DIAGNOSIS — Z952 Presence of prosthetic heart valve: Secondary | ICD-10-CM | POA: Diagnosis not present

## 2020-05-06 LAB — CBC WITH DIFFERENTIAL/PLATELET
Abs Immature Granulocytes: 0.01 10*3/uL (ref 0.00–0.07)
Basophils Absolute: 0 10*3/uL (ref 0.0–0.1)
Basophils Relative: 1 %
Eosinophils Absolute: 0.3 10*3/uL (ref 0.0–0.5)
Eosinophils Relative: 4 %
HCT: 31 % — ABNORMAL LOW (ref 39.0–52.0)
Hemoglobin: 10.1 g/dL — ABNORMAL LOW (ref 13.0–17.0)
Immature Granulocytes: 0 %
Lymphocytes Relative: 28 %
Lymphs Abs: 1.6 10*3/uL (ref 0.7–4.0)
MCH: 29.6 pg (ref 26.0–34.0)
MCHC: 32.6 g/dL (ref 30.0–36.0)
MCV: 90.9 fL (ref 80.0–100.0)
Monocytes Absolute: 0.6 10*3/uL (ref 0.1–1.0)
Monocytes Relative: 10 %
Neutro Abs: 3.2 10*3/uL (ref 1.7–7.7)
Neutrophils Relative %: 57 %
Platelets: 129 10*3/uL — ABNORMAL LOW (ref 150–400)
RBC: 3.41 MIL/uL — ABNORMAL LOW (ref 4.22–5.81)
RDW: 14.2 % (ref 11.5–15.5)
WBC: 5.6 10*3/uL (ref 4.0–10.5)
nRBC: 0 % (ref 0.0–0.2)

## 2020-05-06 LAB — COMPREHENSIVE METABOLIC PANEL
ALT: 20 U/L (ref 0–44)
AST: 20 U/L (ref 15–41)
Albumin: 2.9 g/dL — ABNORMAL LOW (ref 3.5–5.0)
Alkaline Phosphatase: 48 U/L (ref 38–126)
Anion gap: 9 (ref 5–15)
BUN: 13 mg/dL (ref 8–23)
CO2: 29 mmol/L (ref 22–32)
Calcium: 8.7 mg/dL — ABNORMAL LOW (ref 8.9–10.3)
Chloride: 103 mmol/L (ref 98–111)
Creatinine, Ser: 1.05 mg/dL (ref 0.61–1.24)
GFR, Estimated: >60 mL/min (ref >60)
Glucose, Bld: 133 mg/dL — ABNORMAL HIGH (ref 70–99)
Potassium: 3.4 mmol/L — ABNORMAL LOW (ref 3.5–5.1)
Sodium: 141 mmol/L (ref 135–145)
Total Bilirubin: 0.6 mg/dL (ref 0.3–1.2)
Total Protein: 5.8 g/dL — ABNORMAL LOW (ref 6.5–8.1)

## 2020-05-06 LAB — BRAIN NATRIURETIC PEPTIDE: B Natriuretic Peptide: 50.1 pg/mL (ref 0.0–100.0)

## 2020-05-06 LAB — MAGNESIUM: Magnesium: 1.6 mg/dL — ABNORMAL LOW (ref 1.7–2.4)

## 2020-05-06 LAB — GLUCOSE, CAPILLARY
Glucose-Capillary: 109 mg/dL — ABNORMAL HIGH (ref 70–99)
Glucose-Capillary: 101 mg/dL — ABNORMAL HIGH (ref 70–99)
Glucose-Capillary: 134 mg/dL — ABNORMAL HIGH (ref 70–99)

## 2020-05-06 LAB — PROCALCITONIN: Procalcitonin: <0.1 ng/mL

## 2020-05-06 SURGERY — INVASIVE LAB ABORTED CASE
Anesthesia: Monitor Anesthesia Care

## 2020-05-06 MED ORDER — POTASSIUM CHLORIDE CRYS ER 20 MEQ PO TBCR
40.0000 meq | EXTENDED_RELEASE_TABLET | Freq: Once | ORAL | Status: AC
  Administered 2020-05-06: 40 meq via ORAL
  Filled 2020-05-06: qty 2

## 2020-05-06 MED ORDER — PROPOFOL 500 MG/50ML IV EMUL
INTRAVENOUS | Status: DC | PRN
  Administered 2020-05-06: 150 ug/kg/min via INTRAVENOUS

## 2020-05-06 MED ORDER — BUTAMBEN-TETRACAINE-BENZOCAINE 2-2-14 % EX AERO
INHALATION_SPRAY | CUTANEOUS | Status: DC | PRN
  Administered 2020-05-06: 2 via TOPICAL

## 2020-05-06 MED ORDER — PROPOFOL 10 MG/ML IV BOLUS
INTRAVENOUS | Status: DC | PRN
  Administered 2020-05-06 (×3): 20 mg via INTRAVENOUS

## 2020-05-06 NOTE — Interval H&P Note (Signed)
History and Physical Interval Note:  05/06/2020 10:27 AM  Tim [name]  has presented today for surgery, with the diagnosis of BACTEREMIA.  The various methods of treatment have been discussed with the patient and family. After consideration of risks, benefits and other options for treatment, the patient has consented to  Procedure(s): TRANSESOPHAGEAL ECHOCARDIOGRAM (TEE) ([name]) as a surgical intervention.  The patient's history has been reviewed, patient examined, no change in status, stable for surgery.  I have reviewed the patient's chart and labs.  Questions were answered to the patient's satisfaction.     Elouise Munroe

## 2020-05-06 NOTE — Progress Notes (Deleted)
  Echocardiogram Echocardiogram Transesophageal has been performed.  Pruitt Taboada G Boen Sterbenz 05/06/2020, 11:26 AM

## 2020-05-06 NOTE — Progress Notes (Signed)
PT Cancellation Note  Patient Details Name: Tim [name] MRN: [id number] DOB: 1940/12/19   Cancelled Treatment:    Reason Eval/Treat Not Completed: (P) Patient at procedure or test/unavailable (Pt off unit for TEE.)   Seanpaul Preece [name] 05/06/2020, 10:14 AM  Erasmo Leventhal , PTA Acute Rehabilitation Services Pager [phone] Office [phone]

## 2020-05-06 NOTE — H&P (View-Only) (Signed)
Progress Note  Patient Name: Tim [name] Date of Encounter: 05/06/2020  [facility] Cardiologist: Kirk Ruths, MD   Subjective   79 y.o. 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  Unable to pass probe for TEE 05/03/20; Esophageal dilation 05/04/20.  Cultures negative through course  No complaints today. Eager to get some answers but a bit nervous for TEE.  Inpatient Medications    Scheduled Meds: . allopurinol  100 mg Oral BID  . apixaban  5 mg Oral BID  . Chlorhexidine Gluconate Cloth  6 each Topical Daily  . ezetimibe  10 mg Oral Daily  . finasteride  5 mg Oral Daily  . fluticasone  1 spray Each Nare Daily  . gabapentin  800 mg Oral QID  . insulin aspart  0-9 Units Subcutaneous TID WC  . isosorbide mononitrate  30 mg Oral Daily  . magnesium oxide  400 mg Oral Daily  . metoprolol succinate  25 mg Oral Daily  . mometasone-formoterol  2 puff Inhalation BID  .  morphine injection  2 mg Intravenous Once  . pantoprazole  40 mg Oral Q1200  . potassium chloride  40 mEq Oral Once  . rosuvastatin  40 mg Oral QHS  . sacubitril-valsartan  1 tablet Oral BID  . sodium chloride flush  10-40 mL Intracatheter Q12H  . spironolactone  12.5 mg Oral Daily  . torsemide  20 mg Oral Daily   Continuous Infusions: . sodium chloride    . ampicillin (OMNIPEN) IV Stopped (05/06/20 0502)  . cefTRIAXone (ROCEPHIN)  IV Stopped (05/05/20 2233)   PRN  Meds: albuterol, nitroGLYCERIN, sodium chloride, sodium chloride flush   Vital Signs    Vitals:   05/06/20 0413 05/06/20 0641 05/06/20 0740 05/06/20 0838  BP: (!) 100/50     Pulse: 96  79   Resp: 16  20   Temp: 98 F (36.7 C)  98.2 F (36.8 C)   TempSrc: Oral  Axillary   SpO2: 96%  93% 93%  Weight:  (!) 146 kg    Height:        Intake/Output Summary (Last 24 hours) at 05/06/2020 0936 Last data filed at 05/06/2020 0600 Gross per 24 hour  Intake 1483.17 ml  Output 700 ml  Net 783.17 ml   Last 3 Weights 05/06/2020 05/05/2020 05/04/2020  Weight (lbs) 321 lb 14 oz 324 lb 11.8 oz 321 lb 14 oz  Weight (kg) 146 kg 147.3 kg 146 kg      Telemetry    Paced rhythm; presently off telemetry- Personally Reviewed  ECG    No new tracings - Personally Reviewed  Physical Exam   GEN: Obese male in no acute distress.   Neck: No JVD Cardiac: RRR, 2/6 systolic murmur.  Respiratory: Clear to auscultation bilaterally. GI: Soft, nontender, non-distended  MS: legs wrapped Neuro:  Nonfocal  Psych: Normal affect   Labs    High Sensitivity Troponin:   Recent Labs  Lab 04/28/20 2118 04/28/20  2336 04/29/20 0013 04/29/20 0759 05/01/20 1748  TROPONINIHS 11 16 15 15 8       Chemistry Recent Labs  Lab 05/04/20 0357 05/05/20 0418 05/06/20 0421  NA 141 142 141  K 3.2* 3.7 3.4*  CL 103 104 103  CO2 28 28 29   GLUCOSE 115* 122* 133*  BUN 11 10 13   CREATININE 0.97 1.05 1.05  CALCIUM 8.2* 8.7* 8.7*  PROT 5.6* 6.2* 5.8*  ALBUMIN 2.8* 3.0* 2.9*  AST 26 23 20   ALT 23 22 20   ALKPHOS 47 47 48  BILITOT 0.7 0.5 0.6  GFRNONAA >60 >60 >60  ANIONGAP 10 10 9      Hematology Recent Labs  Lab 05/04/20 0357 05/05/20 0418 05/06/20 0421  WBC 5.0 6.4 5.6  RBC 3.36* 3.58* 3.41*  HGB 10.0* 10.7* 10.1*  HCT 30.8* 32.7* 31.0*  MCV 91.7 91.3 90.9  MCH 29.8 29.9 29.6  MCHC 32.5 32.7 32.6  RDW 14.2 13.9 14.2  PLT 120* 136* 129*    BNP Recent Labs  Lab 05/04/20 0357 05/05/20 0419  05/06/20 0421  BNP 64.1 87.1 50.1     DDimer  Recent Labs  Lab 05/01/20 0319 05/02/20 0241 05/03/20 0350  DDIMER 0.79* 0.67* 1.30*     Radiology    No results found.  Cardiac Studies   Echo 04/29/20: 1. Left ventricular ejection fraction, by estimation, is 55 to 60%. The  left ventricle has normal function. The left ventricle has no regional  wall motion abnormalities. Left ventricular diastolic parameters are  consistent with Grade I diastolic  dysfunction (impaired relaxation).   2. Right ventricular systolic function is normal. The right ventricular  size is mildly enlarged. Tricuspid regurgitation signal is inadequate for  assessing PA pressure.   3. Left atrial size was mildly dilated.   4. The mitral valve is degenerative. No evidence of mitral valve  regurgitation. Mild mitral stenosis. The mean mitral valve gradient is 5.0  mmHg. Moderate mitral annular calcification. There is a 1.3 x 1 cm mobile  vegetation attached to the anterior  mitral leaflet.   5. The aortic valve was not well visualized. Aortic valve regurgitation  is not visualized. Mild aortic valve stenosis. Aortic valve mean gradient  measures 16.0 mmHg.   6. The inferior vena cava is normal in size with greater than 50%  respiratory variability, suggesting right atrial pressure of 3 mmHg.   7. Technically difficult study with very poor windows. Possible mitral  valve vegetation. The tricuspid valve, aortic valve, and ICD were poorly  visualized. Suggest TEE.   Patient Profile     79 y.o. 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 Unable to complete TEE 05/03/20 due to obstruction. GI consulted, underwent EGD with dilation 05/04/20.  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 ([employer]) involvement CHB -  PPM dependent Known PFO Prior osteomyelitis - TEE 12/02/19 with vegetation on TAVR valve and RV pacer lead - TTE this admission with poor windows - TEE for today - ICD interrogated: ERI 3.5 years, underlying rhythm is CHB, pt is PPM dependent - pt is not a candidate for sternotomy or laser lead extraction  Ischemic cardiomyopathy BiV ICD in place Chronic systolic and diastolic heart failure DM HTN - optivol showed increased thoracic impedence - pt states breathing is at baseline - BNP 70 --> 105 --> 87 - continue low dose entresto - BP has been well-controlled - continue BB, imdur, entresto, spiro, 20 mg torsemide - continue statin and zetia   Paroxysmal atrial fibrillation Chronic anticoagulation This patients CHA2DS2-VASc Score and unadjusted Ischemic Stroke Rate (% per year) is equal to 9.7 % stroke rate/year from a score of 6 (2age, HTN, DM, CHF, CAD) - continue eliquis and BB    For questions or updates, please contact [facility] Please consult [email or web address] for contact info under       Signed, Werner Lean, MD  05/06/2020, 9:36 AM

## 2020-05-06 NOTE — TOC Initial Note (Addendum)
Transition of Care (TOC) - Initial/Assessment Note  Marvetta Gibbons RN, BSN Transitions of Care Unit 4E- RN Case Manager See Treatment Team for direct phone #    Patient Details  Name: Tim [name] MRN: [id number] Date of Birth: 1940/11/05  Transition of Care [facility]) CM/SW Contact:    Dawayne Patricia, RN Phone Number: 05/06/2020, 1:59 PM  Clinical Narrative:                 Pt from home with wife, noted ID note that pt will need prolonged IV abx treatment- pt has PICC line in. Pt has been active with [facility] in the past and [facility] for [facility] needs.  CM in to speak with pt and wife at the bedside- per conversation pt is agreeable to continue to use [facility] for Home infusion pharmacy needs- discussed [place] and options other than [facility]- noted per PT/OT notes that there are no recommendations for home f/u regarding therapy needs. If pt does not have any other nursing needs besides abx needs- then could possibly use [place] or [place] coordinating with [facility] for PICC line/lab draws. Wife remembers that they had another agency prior to connecting with [name]. Pt and wife would like to see if they could again use the alternate agency as they really liked the nurse from there. Will check with Pam to see which agency that was in their records. Pt also mentioned about his home infusion pump alarms not being loud enough will also have Pam look into this.   Per pt he has all needed DME for home.   Call made to [name] with [facility]- regarding referral for home infusion needs - Pam will f/u on referral and check into home pump concerns from pt and also alternate agency.  CM to continue to follow for transition of care coordination for home IV abx needs. Pt will need HHRN orders placed for abx needs/PICC line care  1600- per Claris Gower was agency that provided nursing support prior to [facility]- received msg that pt wanted to speak with CM again  regarding [place] needs- in to speak with pt at bedside- per pt he has changed his mind and does not want to use [facility] again for Home IV abx needs- states that he reason is regarding not being about to reach them about his statements and billing as well as [employer] also not being about to reach them with the contact # provided on statement- he asks that that CM share that with them when I notify them of change. Choice offered for alternate infusion pharmacy- pt is ok with trying [facility]. Call made to [facility] with [facility]- awaiting return call.  Call made to Sharrie Rothman with [facility]- for home IV abx referral needs- [facility] can provide RN support also. - [facility] has accepted referral pending insurance check  Discussed with pt nursing needs again and if wraps would need to be changed at home- pt states that they are due to be changed again tomorrow and will ask about plan for leg wound care. We will f/u tomorrow as to if pt will need nursing for any wound care needs or just PICC line/lab draws.   1650- received return call back from [facility] with [facility]- informed her of patients choice not to continue services with them along with reason. Alerted Sharrie Rothman with [facility] that they could move forward with referral and contacting patient.    Expected Discharge Plan: [place] Services Barriers  to Discharge: Continued Medical Work up   Patient Goals and CMS Choice Patient states their goals for this hospitalization and ongoing recovery are:: return home CMS Medicare.gov Compare Post Acute Care list provided to:: Patient Choice offered to / list presented to : Patient, Spouse  Expected Discharge Plan and Services Expected Discharge Plan: [place]   Discharge Planning Services: CM Consult Post Acute Care Choice: Home Health, Resumption of Svcs/PTA Provider Living arrangements for the past 2 months: Single Family Home                           HH Arranged: RN, IV Antibiotics HH Agency:  [facility] Date HH Agency Contacted: 05/06/20 Time HH Agency Contacted: 59 Representative spoke with at [place]: [place] Arrangements/Services Living arrangements for the past 2 months: [place] with:: Spouse Patient language and need for interpreter reviewed:: Yes Do you feel safe going back to the place where you live?: Yes      Need for Family Participation in Patient Care: Yes (Comment) Care giver support system in place?: Yes (comment) Current home services: DME Criminal Activity/Legal Involvement Pertinent to Current Situation/Hospitalization: No - Comment as needed  Activities of Daily Living Home Assistive Devices/Equipment: CPAP ADL Screening (condition at time of admission) Patient's cognitive ability adequate to safely complete daily activities?: Yes Is the patient deaf or have difficulty hearing?: Yes Does the patient have difficulty seeing, even when wearing glasses/contacts?: No Does the patient have difficulty concentrating, remembering, or making decisions?: No Patient able to express need for assistance with ADLs?: Yes Does the patient have difficulty dressing or bathing?: No Independently performs ADLs?: Yes (appropriate for developmental age) Does the patient have difficulty walking or climbing stairs?: Yes Weakness of Legs: Left Weakness of Arms/Hands: None  Permission Sought/Granted Permission sought to share information with : Chartered certified accountant granted to share information with : Yes, Verbal Permission Granted     Permission granted to share info w AGENCY: [facility]        Emotional Assessment Appearance:: Appears stated age Attitude/Demeanor/Rapport: Engaged Affect (typically observed): Appropriate Orientation: : Oriented to Self, Oriented to Place, Oriented to  Time, Oriented to Situation Alcohol / Substance Use: Not Applicable Psych Involvement: No (comment)  Admission diagnosis:  Fever  [R50.9] Sepsis ([place]) [A41.9] Fever in other diseases [R50.81] Endocarditis due to other organism, unspecified chronicity [I39] Patient Active Problem List   Diagnosis Date Noted  . Non-pressure chronic ulcer of other part of left foot limited to breakdown of skin ([place])   . Esophageal dysphagia   . Bacteremia   . Infective endocarditis   . Fever 04/28/2020  . Cervical lymphadenopathy 03/15/2020  . 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 ICD (implantable cardioverter-defibrillator) infection ([place]) 12/22/2019  . Gram-positive bacteremia 11/27/2019  . FUO (fever of unknown origin) 11/26/2019  . Low blood pressure reading 11/05/2019  . Wound dehiscence 10/22/2019  . PFO (patent foramen ovale) 10/06/2019  . ICD (implantable cardioverter-defibrillator) battery depletion   . Pressure injury of skin 09/13/2019  . Enterococcal bacteremia history  09/13/2019  . 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  . 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 Urinary retention 09/13/2019  . Phimosis 09/13/2019  . SIRS (systemic inflammatory response syndrome) ([place]) 09/12/2019  . Sepsis ([place]) 09/12/2019  . Enterococcus UTI 09/12/2019  . Cellulitis of foot   . History of transmetatarsal amputation of left foot (  [place])   . Subacute osteomyelitis, left ankle and foot ([place])   . 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 Diabetic neuropathy, painful ([place]) 12/26/2016  . Neuropathic pain of foot, left 06/20/2016  . 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 Status post amputation of left great toe ([place]) 06/08/2015  . Spinal stenosis of lumbar region 02/16/2015  . Hereditary and idiopathic peripheral neuropathy 01/12/2015  . Oral thrush 12/28/2014  . Orthostatic dizziness 05/24/2014  . Paresthesias with subjective weakness 01/30/2014  . Bilateral thigh pain 01/30/2014  . Abdominal wall pain in  right upper quadrant 01/22/2014  . Gross hematuria 09/25/2013  . Intertrigo 09/25/2013  . IBS (irritable bowel syndrome) 09/11/2013  . Chronic pain syndrome 08/06/2013  . CAD (coronary artery disease), native coronary artery 12/25/2012  . Paroxysmal atrial fibrillation ([place]) 12/25/2012  . Moderate aortic stenosis 12/25/2012  . Aortic stenosis 12/24/2012  . Personal history of colonic adenoma 10/30/2012  . Diverticulosis of colon (without mention of hemorrhage) 10/30/2012  . Internal hemorrhoids 10/30/2012  . Change in bowel habits intermittent loose stools 10/30/2012  . Routine health maintenance 06/09/2012  . Hereditary peripheral neuropathy 10/10/2011  . Long term current use of anticoagulant - apixiban 08/16/2010  . Dyspnea on exertion 09/08/2009  . HYPERCHOLESTEROLEMIA-PURE 01/13/2009  . Essential hypertension, benign 01/13/2009  . EDEMA 01/13/2009  . GOUT 01/12/2009  . GLAUCOMA 01/12/2009  . Venous insufficiency (chronic) (peripheral) 01/12/2009  . COPD (chronic obstructive pulmonary disease) ([place]) 01/12/2009  . VENTRAL HERNIA 01/12/2009  . OSTEOARTHRITIS 01/12/2009  . ARTHRITIS 01/12/2009  . DEGENERATIVE DISC DISEASE 01/12/2009  . CATARACT, HX OF 01/12/2009  . HEART MURMUR, HX OF 01/12/2009  . BENIGN PROSTATIC HYPERTROPHY, HX OF 01/12/2009  . Obesity 01/17/2007  . OBSTRUCTIVE SLEEP APNEA 01/17/2007  . ASTHMA 01/17/2007  . GERD 01/17/2007  . HALLUX VALGUS, ACQUIRED 01/17/2007  . Pulmonary hypertension ([place]) 01/17/2007   PCP:  Tammi Sou, MD Pharmacy:   [id number] [place] [id number]: [phone]: [phone]    Social Determinants of Health (SDOH) Interventions    Readmission Risk Interventions Readmission Risk Prevention Plan 11/28/2019  Transportation Screening Complete  [place] or [place]  Social Work Consult for RB[place] Planning/Counseling Complete  Palliative Care Screening Not Applicable  Medication Review (RN Care Manager) Referral to Pharmacy  Some recent data might be hidden

## 2020-05-06 NOTE — Anesthesia Preprocedure Evaluation (Signed)
Anesthesia Evaluation  Patient identified by MRN, date of birth, ID band Patient awake    Reviewed: Allergy & Precautions, H&P , NPO status , Patient's Chart, lab work & pertinent test results, reviewed documented beta blocker date and time   History of Anesthesia Complications Negative for: history of anesthetic complications  Airway Mallampati: II  TM Distance: >3 FB Neck ROM: Full    Dental no notable dental hx.    Pulmonary asthma , sleep apnea and Continuous Positive Airway Pressure Ventilation , COPD,  COPD inhaler,    Pulmonary exam normal        Cardiovascular hypertension, Pt. on medications and Pt. on home beta blockers + CAD, + Past MI, + Cardiac Stents (2017 on Eliquis) and +CHF  Normal cardiovascular exam+ dysrhythmias Atrial Fibrillation + pacemaker + Cardiac Defibrillator   S/P TAVR 2017    Neuro/Psych  Neuromuscular disease (peripheral neuropathy) negative psych ROS   GI/Hepatic Neg liver ROS, hiatal hernia, PUD, GERD  Medicated and Controlled,  Endo/Other  diabetes, Type 2Morbid obesity  Renal/GU negative Renal ROS  negative genitourinary   Musculoskeletal  (+) Arthritis ,   Abdominal   Peds  Hematology  (+) Blood dyscrasia, anemia ,   Anesthesia Other Findings Bacteremia  Reproductive/Obstetrics negative OB ROS                             Anesthesia Physical  Anesthesia Plan  ASA: III  Anesthesia Plan: MAC   Post-op Pain Management:    Induction: Intravenous  PONV Risk Score and Plan: 1 and Treatment may vary due to age or medical condition and Propofol infusion  Airway Management Planned: Natural Airway and Nasal Cannula  Additional Equipment:   Intra-op Plan:   Post-operative Plan:   Informed Consent: I have reviewed the patients History and Physical, chart, labs and discussed the procedure including the risks, benefits and alternatives for the proposed  anesthesia with the patient or authorized representative who has indicated his/her understanding and acceptance.   Patient has DNR.  Discussed DNR with patient.     Plan Discussed with: CRNA and Anesthesiologist  Anesthesia Plan Comments:         Anesthesia Quick Evaluation

## 2020-05-06 NOTE — CV Procedure (Signed)
INDICATIONS: Endocarditis  PROCEDURE:   Informed consent was obtained prior to the procedure. The risks, benefits and alternatives for the procedure were discussed and the patient comprehended these risks.  Risks include, but are not limited to, cough, sore throat, vomiting, nausea, somnolence, esophageal and stomach trauma or perforation, bleeding, low blood pressure, aspiration, pneumonia, infection, trauma to the teeth and death.    After a procedural time-out, the oropharynx was anesthetized with 20% benzocaine spray.   During this procedure the patient was administered propofol per anesthesia.  The patient's heart rate, blood pressure, and oxygen saturation were monitored continuously during the procedure. The period of conscious sedation was 30 minutes, of which I was present face-to-face 100% of this time.  The transesophageal probe was inserted to 30 cm but could not be advance further. Discussed with anesthesia team for further management strategies. Team agreed in shared fashion to not proceed with case. The patient was kept under observation until the patient left the procedure room.  The patient left the procedure room in stable condition.   Agitated microbubble saline contrast was not administered.  COMPLICATIONS:    There were no immediate complications.  FINDINGS:   Unable to perform TEE due to inability to advance scope beyond 30 cm, no diagnostic images at that level.   RECOMMENDATIONS:     Discussed with Dr. Gasper Sells, patient will return to hospital room.   Time Spent Directly with the Patient:  60 minutes   Breonna Gafford A Margaretann Loveless 05/06/2020, 11:03 AM

## 2020-05-06 NOTE — Transfer of Care (Signed)
Immediate Anesthesia Transfer of Care Note  Patient: Tim [name]  Procedure(s) Performed: INVASIVE LAB ABORTED CASE  Patient Location: Endoscopy Unit  Anesthesia Type:MAC  Level of Consciousness: drowsy  Airway & Oxygen Therapy: Patient Spontanous Breathing and Patient connected to face mask oxygen  Post-op Assessment: Report given to RN, Post -op Vital signs reviewed and stable and Patient moving all extremities  Post vital signs: Reviewed and stable  Last Vitals:  Vitals Value Taken Time  BP 113/50 05/06/20 1108  Temp 36.7 C 05/06/20 1108  Pulse 69 05/06/20 1108  Resp 16 05/06/20 1108  SpO2 96 % 05/06/20 1108    Last Pain:  Vitals:   05/06/20 1108  TempSrc: Axillary  PainSc: 0-No pain         Complications: No complications documented.

## 2020-05-06 NOTE — Progress Notes (Signed)
Progress Note  Patient Name: Tim [name] Date of Encounter: 05/06/2020  [facility] Cardiologist: Kirk Ruths, MD   Subjective   79 y.o. 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  Unable to pass probe for TEE 05/03/20; Esophageal dilation 05/04/20.  Cultures negative through course  No complaints today. Eager to get some answers but a bit nervous for TEE.  Inpatient Medications    Scheduled Meds:  allopurinol  100 mg Oral BID   apixaban  5 mg Oral BID   Chlorhexidine Gluconate Cloth  6 each Topical Daily   ezetimibe  10 mg Oral Daily   finasteride  5 mg Oral Daily   fluticasone  1 spray Each Nare Daily   gabapentin  800 mg Oral QID   insulin aspart  0-9 Units Subcutaneous TID WC   isosorbide mononitrate  30 mg Oral Daily   magnesium oxide  400 mg Oral Daily   metoprolol succinate  25 mg Oral Daily   mometasone-formoterol  2 puff Inhalation BID    morphine injection  2 mg Intravenous Once   pantoprazole  40 mg Oral Q1200   potassium chloride  40 mEq Oral Once   rosuvastatin  40 mg Oral QHS   sacubitril-valsartan  1 tablet Oral BID   sodium chloride flush  10-40 mL Intracatheter Q12H   spironolactone  12.5 mg Oral Daily   torsemide  20 mg Oral Daily   Continuous Infusions:  sodium chloride     ampicillin (OMNIPEN) IV Stopped (05/06/20 0502)   cefTRIAXone (ROCEPHIN)  IV Stopped (05/05/20 2233)   PRN  Meds: albuterol, nitroGLYCERIN, sodium chloride, sodium chloride flush   Vital Signs    Vitals:   05/06/20 0413 05/06/20 0641 05/06/20 0740 05/06/20 0838  BP: (!) 100/50     Pulse: 96  79   Resp: 16  20   Temp: 98 F (36.7 C)  98.2 F (36.8 C)   TempSrc: Oral  Axillary   SpO2: 96%  93% 93%  Weight:  (!) 146 kg    Height:        Intake/Output Summary (Last 24 hours) at 05/06/2020 0936 Last data filed at 05/06/2020 0600 Gross per 24 hour  Intake 1483.17 ml  Output 700 ml  Net 783.17 ml   Last 3 Weights 05/06/2020 05/05/2020 05/04/2020  Weight (lbs) 321 lb 14 oz 324 lb 11.8 oz 321 lb 14 oz  Weight (kg) 146 kg 147.3 kg 146 kg      Telemetry    Paced rhythm; presently off telemetry- Personally Reviewed  ECG    No new tracings - Personally Reviewed  Physical Exam   GEN: Obese male in no acute distress.   Neck: No JVD Cardiac: RRR, 2/6 systolic murmur.  Respiratory: Clear to auscultation bilaterally. GI: Soft, nontender, non-distended  MS: legs wrapped Neuro:  Nonfocal  Psych: Normal affect   Labs    High Sensitivity Troponin:   Recent Labs  Lab 04/28/20 2118 04/28/20  2336 04/29/20 0013 04/29/20 0759 05/01/20 1748  TROPONINIHS 11 16 15 15 8       Chemistry Recent Labs  Lab 05/04/20 0357 05/05/20 0418 05/06/20 0421  NA 141 142 141  K 3.2* 3.7 3.4*  CL 103 104 103  CO2 28 28 29   GLUCOSE 115* 122* 133*  BUN 11 10 13   CREATININE 0.97 1.05 1.05  CALCIUM 8.2* 8.7* 8.7*  PROT 5.6* 6.2* 5.8*  ALBUMIN 2.8* 3.0* 2.9*  AST 26 23 20   ALT 23 22 20   ALKPHOS 47 47 48  BILITOT 0.7 0.5 0.6  GFRNONAA >60 >60 >60  ANIONGAP 10 10 9      Hematology Recent Labs  Lab 05/04/20 0357 05/05/20 0418 05/06/20 0421  WBC 5.0 6.4 5.6  RBC 3.36* 3.58* 3.41*  HGB 10.0* 10.7* 10.1*  HCT 30.8* 32.7* 31.0*  MCV 91.7 91.3 90.9  MCH 29.8 29.9 29.6  MCHC 32.5 32.7 32.6  RDW 14.2 13.9 14.2  PLT 120* 136* 129*    BNP Recent Labs  Lab 05/04/20 0357 05/05/20 0419  05/06/20 0421  BNP 64.1 87.1 50.1     DDimer  Recent Labs  Lab 05/01/20 0319 05/02/20 0241 05/03/20 0350  DDIMER 0.79* 0.67* 1.30*     Radiology    No results found.  Cardiac Studies   Echo 04/29/20: 1. Left ventricular ejection fraction, by estimation, is 55 to 60%. The  left ventricle has normal function. The left ventricle has no regional  wall motion abnormalities. Left ventricular diastolic parameters are  consistent with Grade I diastolic  dysfunction (impaired relaxation).   2. Right ventricular systolic function is normal. The right ventricular  size is mildly enlarged. Tricuspid regurgitation signal is inadequate for  assessing PA pressure.   3. Left atrial size was mildly dilated.   4. The mitral valve is degenerative. No evidence of mitral valve  regurgitation. Mild mitral stenosis. The mean mitral valve gradient is 5.0  mmHg. Moderate mitral annular calcification. There is a 1.3 x 1 cm mobile  vegetation attached to the anterior  mitral leaflet.   5. The aortic valve was not well visualized. Aortic valve regurgitation  is not visualized. Mild aortic valve stenosis. Aortic valve mean gradient  measures 16.0 mmHg.   6. The inferior vena cava is normal in size with greater than 50%  respiratory variability, suggesting right atrial pressure of 3 mmHg.   7. Technically difficult study with very poor windows. Possible mitral  valve vegetation. The tricuspid valve, aortic valve, and ICD were poorly  visualized. Suggest TEE.   Patient Profile     79 y.o. 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 Unable to complete TEE 05/03/20 due to obstruction. GI consulted, underwent EGD with dilation 05/04/20.  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 ([employer]) involvement CHB -  PPM dependent Known PFO Prior osteomyelitis - TEE 12/02/19 with vegetation on TAVR valve and RV pacer lead - TTE this admission with poor windows - TEE for today - ICD interrogated: ERI 3.5 years, underlying rhythm is CHB, pt is PPM dependent - pt is not a candidate for sternotomy or laser lead extraction  Ischemic cardiomyopathy BiV ICD in place Chronic systolic and diastolic heart failure DM HTN - optivol showed increased thoracic impedence - pt states breathing is at baseline - BNP 70 --> 105 --> 87 - continue low dose entresto - BP has been well-controlled - continue BB, imdur, entresto, spiro, 20 mg torsemide - continue statin and zetia   Paroxysmal atrial fibrillation Chronic anticoagulation This patients CHA2DS2-VASc Score and unadjusted Ischemic Stroke Rate (% per year) is equal to 9.7 % stroke rate/year from a score of 6 (2age, HTN, DM, CHF, CAD) - continue eliquis and BB    For questions or updates, please contact [facility] Please consult [email or web address] for contact info under       Signed, Werner Lean, MD  05/06/2020, 9:36 AM

## 2020-05-06 NOTE — Plan of Care (Signed)
Cardiology Plan of Care Note  79 yo M with concern for infective endocarditis.  05/03/20 unable to advance probe again past 30 mm.  Had EGD and esophageal dilation.  Today, unable to advance probl again past this spot. Additional attempts to safely advance probe unsuccessful.    Esophageal spasm on differential;  will defer to primary/GI.  Would recommend empiric treatment for native valve Infective endocarditis; will defer treatment regimen to ID team.  Patient recovering well.  Werner Lean, MD

## 2020-05-06 NOTE — Progress Notes (Signed)
PROGRESS NOTE                                                                                                                                                                                                             Patient Demographics:    Tim [name], is a 79 y.o. male, DOB - 02-03-1941, TXM:[id number]  Outpatient Primary MD for the patient is McGowen, Adrian Blackwater, MD    LOS - 7  Admit date - 04/28/2020    Chief Complaint  Patient presents with   Weakness       Brief Narrative (HPI from H&P)   Tim [name] is a 79 y.o. 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  Work-up suggestive for possible prosthetic aortic valve endocarditis.  TEE failed x2.  Awaiting for ID for further recommendations.   Subjective:  Patient seen and examined earlier today before he went for D.  His wife was at the bedside.  Patient had no complaint.   Assessment  & Plan :    1. 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 On chronic amoxicillin treatment under the care of Dr. Drucilla Schmidt ID. Now 1.3 x 1 cm mobile vegetation attached to the anterior mitral leaflet on  TTE.  Now he seems to have new mitral valve endocarditis per TTE, ID following, TEE failed on 05/03/2020 due to possible esophageal stricture, GI has done EGD on 05/04/2020 with normal-appearing esophagus however they tried to empirically stretch it so that TEE can be done however under attempt for TEE was failed today with a similar situation of not being able to proceed with probe after 30 cm.  Blood cultures drawn this admission so far negative, continue empiric IV Rocephin 2 g and ampicillin.  ID in their last note on 05/03/2020 had recommended 6 weeks of total IV antibiotics.  PICC line placed 05/01/2020.  2. Paroxysmal A. fib. Chads vasc score 2 score of greater than 3.  Eliquis was held for EGD on 05/04/2020, resumed on 05/05/2020, continue beta-blocker.  3. Ischemic cardiomyopathy with chronic systolic heart failure EF around 40%. He has chronic 2+ leg edema, pressure is improved low-dose beta-blocker, diuretics as tolerated by blood pressure and renal function, he had a brief episode of chest pain on 05/01/2020 with unchanged EKG, added Imdur, undergoing TEE soon, Cardiology on board due to few episodes of chest pain.  His Entresto was resumed on 05/02/2020.  4. Anemia of chronic disease. Stable follow.  5. COPD. Stable no wheezing.  6. BPH.  Continue alpha-blocker  7.  Hypomagnesemia and hypokalemia.  Resolved.  8.  AKI.  Resolved  9.  Possible esophageal stricture noted on TEE.  GI consulted by cardiology.  Status post EGD which did not show stricture however GI empirically stressed out esophagus to allow smooth TEE.  10.Thrombocytopenia. Likely due to infection monitor.  Improving already.  Lab Results  Component Value Date   PLT 129 (L) 05/06/2020    11. Prediabetes. Monitor. Add ISS as he stress-induced hyperglycemia.  Lab Results  Component Value Date   HGBA1C 6.1 (H) 04/29/2020   CBG (last 3)  Recent Labs    05/05/20 2145 05/06/20 0616 05/06/20 1159  GLUCAP 111* 109*  101*   Lab Results  Component Value Date   TSH 0.905 04/29/2020   Left stump wound: Seen by Dr. Sharol Given.  Follow-up outpatient.   Condition - Extremely Guarded  Family Communication  : None.  Patient alert and oriented.  Plan discussed with him.  Code Status : DNR  Consults  :  ID, cardiology, GI  Procedures  :    EGD 05/04/2020.  Normal esophagus with some gastric polyps.    PICC line placed 05/01/2020.   CT L Foot - no abscess  TTE - 1. Left ventricular ejection fraction, by estimation, is 55 to 60%. The left ventricle has normal function. The left ventricle has no regional wall motion abnormalities. Left ventricular diastolic parameters are consistent with Grade I diastolic dysfunction (impaired relaxation).  2. Right ventricular systolic function is normal. The right ventricular size is mildly enlarged. Tricuspid regurgitation signal is inadequate for assessing PA pressure.  3. Left atrial size was mildly dilated.  4. The mitral valve is degenerative. No evidence of mitral valve regurgitation. Mild mitral stenosis. The mean mitral valve gradient is 5.0 mmHg. Moderate mitral annular calcification. There is a 1.3 x 1 cm mobile vegetation attached to the anterior mitral leaflet.  5. The aortic valve was not well visualized. Aortic valve regurgitation is not visualized. Mild aortic valve stenosis. Aortic valve mean gradient measures 16.0 mmHg.  6. The inferior vena cava is normal in size with greater than 50% respiratory variability, suggesting right atrial pressure of 3 mmHg.  7. Technically difficult study with very poor windows. Possible mitral valve vegetation. The tricuspid valve, aortic valve, and ICD were poorly visualized  PUD Prophylaxis : PPI  Disposition Plan  :    Status is: Inpatient  Remains inpatient appropriate because:IV treatments appropriate due to intensity of illness or inability to take PO   Dispo: The patient is from: Home  Anticipated d/c is to:  Home              Anticipated d/c date is: 1 to 2 days              Patient currently is not medically stable to d/c.  DVT Prophylaxis  : Eliquis >> SCDs  Lab Results  Component Value Date   PLT 129 (L) 05/06/2020    Diet :  Diet Order            Diet Carb Modified Fluid consistency: Thin; Room service appropriate? Yes with Assist  Diet effective now                  Inpatient Medications  Scheduled Meds:  allopurinol  100 mg Oral BID   apixaban  5 mg Oral BID   Chlorhexidine Gluconate Cloth  6 each Topical Daily   ezetimibe  10 mg Oral Daily   finasteride  5 mg Oral Daily   fluticasone  1 spray Each Nare Daily   gabapentin  800 mg Oral QID   insulin aspart  0-9 Units Subcutaneous TID WC   isosorbide mononitrate  30 mg Oral Daily   magnesium oxide  400 mg Oral Daily   metoprolol succinate  25 mg Oral Daily   mometasone-formoterol  2 puff Inhalation BID    morphine injection  2 mg Intravenous Once   pantoprazole  40 mg Oral Q1200   rosuvastatin  40 mg Oral QHS   sacubitril-valsartan  1 tablet Oral BID   sodium chloride flush  10-40 mL Intracatheter Q12H   spironolactone  12.5 mg Oral Daily   torsemide  20 mg Oral Daily   Continuous Infusions:  ampicillin (OMNIPEN) IV 2 g (05/06/20 1157)   cefTRIAXone (ROCEPHIN)  IV Stopped (05/05/20 2233)   PRN Meds:.albuterol, nitroGLYCERIN, sodium chloride, sodium chloride flush  Antibiotics  :    Anti-infectives (From admission, onward)   Start     Dose/Rate Route Frequency Ordered Stop   04/29/20 2200  cefTRIAXone (ROCEPHIN) 2 g in sodium chloride 0.9 % 100 mL IVPB  Status:  Discontinued        2 g 200 mL/hr over 30 Minutes Intravenous Every 24 hours 04/29/20 0013 04/29/20 1206   04/29/20 1600  ampicillin (OMNIPEN) 2 g in sodium chloride 0.9 % 100 mL IVPB        2 g 300 mL/hr over 20 Minutes Intravenous Every 4 hours 04/29/20 1206     04/29/20 1300  cefTRIAXone (ROCEPHIN) 2 g in sodium chloride 0.9 %  100 mL IVPB        2 g 200 mL/hr over 30 Minutes Intravenous Every 12 hours 04/29/20 1206     04/29/20 0600  ampicillin (OMNIPEN) 2 g in sodium chloride 0.9 % 100 mL IVPB  Status:  Discontinued        2 g 300 mL/hr over 20 Minutes Intravenous Every 6 hours 04/29/20 0013 04/29/20 1206   04/28/20 2215  cefTRIAXone (ROCEPHIN) 1 g in sodium chloride 0.9 % 100 mL IVPB  Status:  Discontinued        1 g 200 mL/hr over 30 Minutes Intravenous  Once 04/28/20 2201 04/28/20 2211   04/28/20 2215  ampicillin (OMNIPEN) 2 g in sodium chloride 0.9 % 100 mL IVPB        2 g 300 mL/hr over 20 Minutes Intravenous  Once 04/28/20 2201 04/29/20 0027   04/28/20 2215  cefTRIAXone (ROCEPHIN) 2 g in sodium chloride  0.9 % 100 mL IVPB        2 g 200 mL/hr over 30 Minutes Intravenous  Once 04/28/20 2211 04/28/20 2314       Time Spent in minutes 30   Darliss Cheney M.D on 05/06/2020 at 12:18 PM  To page go to [email or web address] - password [facility]  [facility] -  Office  [phone]   See all Orders from today for further details     Objective:   Vitals:   05/06/20 1108 05/06/20 1112 05/06/20 1123 05/06/20 1151  BP: (!) 113/50 (!) 122/53 (!) 132/46 128/73  Pulse: 69 66 60 62  Resp: 16 16 13 16   Temp: 98 F (36.7 C)   97.8 F (36.6 C)  TempSrc: Axillary   Oral  SpO2: 96% 97% 99%   Weight:      Height:        Wt Readings from Last 3 Encounters:  05/06/20 (!) 146 kg  04/21/20 (!) 141.5 kg  04/19/20 (!) 145.2 kg     Intake/Output Summary (Last 24 hours) at 05/06/2020 1218 Last data filed at 05/06/2020 1056 Gross per 24 hour  Intake 1443.17 ml  Output 700 ml  Net 743.17 ml     Physical Exam  General exam: Appears calm and comfortable, morbidly obese Respiratory system: Clear to auscultation. Respiratory effort normal. Cardiovascular system: S1 & S2 heard, RRR. No JVD, murmurs, rubs, gallops or clicks. No pedal edema. Gastrointestinal system: Abdomen is nondistended, soft and nontender. No  organomegaly or masses felt. Normal bowel sounds heard. Central nervous system: Alert and oriented. No focal neurological deficits. Extremities: Symmetric 5 x 5 power. Skin: No rashes, lesions or ulcers.  Psychiatry: Judgement and insight appear normal. Mood & affect appropriate.    Data Review:    CBC Recent Labs  Lab 05/02/20 0241 05/03/20 0350 05/04/20 0357 05/05/20 0418 05/06/20 0421  WBC 5.6 6.3 5.0 6.4 5.6  HGB 9.3* 10.7* 10.0* 10.7* 10.1*  HCT 28.9* 33.3* 30.8* 32.7* 31.0*  PLT 104* 130* 120* 136* 129*  MCV 90.3 91.7 91.7 91.3 90.9  MCH 29.1 29.5 29.8 29.9 29.6  MCHC 32.2 32.1 32.5 32.7 32.6  RDW 14.5 14.5 14.2 13.9 14.2  LYMPHSABS 1.3 1.7 1.4 1.7 1.6  MONOABS 0.7 0.6 0.6 0.6 0.6  EOSABS 0.2 0.3 0.2 0.3 0.3  BASOSABS 0.0 0.1 0.0 0.1 0.0    Recent Labs  Lab 04/30/20 0202 04/30/20 0202 05/01/20 0319 05/01/20 0319 05/02/20 0241 05/03/20 0350 05/04/20 0357 05/05/20 0418 05/05/20 0419 05/06/20 0421  NA 140   < > 140   < > 142 141 141 142  --  141  K 4.0   < > 3.7   < > 3.5 3.8 3.2* 3.7  --  3.4*  CL 109   < > 105   < > 103 105 103 104  --  103  CO2 25   < > 26   < > 29 27 28 28   --  29  GLUCOSE 127*   < > 121*   < > 123* 135* 115* 122*  --  133*  BUN 18   < > 21   < > 18 12 11 10   --  13  CREATININE 0.90   < > 1.27*   < > 1.15 0.90 0.97 1.05  --  1.05  CALCIUM 8.6*   < > 8.5*   < > 8.5* 8.7* 8.2* 8.7*  --  8.7*  AST 35   < > 28   < >  25 30 26 23   --  20  ALT 23   < > 23   < > 21 25 23 22   --  20  ALKPHOS 48   < > 44   < > 43 52 47 47  --  48  BILITOT 0.7   < > 0.5   < > 0.7 0.6 0.7 0.5  --  0.6  ALBUMIN 2.8*   < > 2.8*   < > 2.8* 3.1* 2.8* 3.0*  --  2.9*  MG 1.6*   < > 1.6*   < > 1.8 2.0 1.6* 1.8  --  1.6*  CRP 4.0*  --  1.9*  --  1.0* 0.7  --   --   --   --   DDIMER 0.93*  --  0.79*  --  0.67* 1.30*  --   --   --   --   PROCALCITON 0.55   < > 0.43   < > 0.24 <0.10 <0.10 <0.10  --  <0.10  BNP 82.6   < > 94.0   < > 70.3 105.3* 64.1  --  87.1 50.1   < > =  values in this interval not displayed.    ------------------------------------------------------------------------------------------------------------------ No results for input(s): CHOL, HDL, LDLCALC, TRIG, CHOLHDL, LDLDIRECT in the last 72 hours.  Lab Results  Component Value Date   HGBA1C 6.1 (H) 04/29/2020   ------------------------------------------------------------------------------------------------------------------ No results for input(s): TSH, T4TOTAL, T3FREE, THYROIDAB in the last 72 hours.  Invalid input(s): FREET3  Cardiac Enzymes No results for input(s): CKMB, TROPONINI, MYOGLOBIN in the last 168 hours.  Invalid input(s): CK ------------------------------------------------------------------------------------------------------------------    Component Value Date/Time   BNP 50.1 05/06/2020 0421   BNP 32.0 01/14/2014 1656    Micro Results Recent Results (from the past 240 hour(s))  Respiratory Panel by RT PCR (Flu A&B, Covid) - Nasopharyngeal Swab     Status: None   Collection Time: 04/28/20 10:43 PM   Specimen: Nasopharyngeal Swab  Result Value Ref Range Status   SARS Coronavirus 2 by RT PCR NEGATIVE NEGATIVE Final    Comment: (NOTE) SARS-CoV-2 target nucleic acids are NOT DETECTED.  The SARS-CoV-2 RNA is generally detectable in upper respiratoy specimens during the acute phase of infection. The lowest concentration of SARS-CoV-2 viral copies this assay can detect is 131 copies/mL. A negative result does not preclude SARS-Cov-2 infection and should not be used as the sole basis for treatment or other patient management decisions. A negative result may occur with  improper specimen collection/handling, submission of specimen other than nasopharyngeal swab, presence of viral mutation(s) within the areas targeted by this assay, and inadequate number of viral copies (<131 copies/mL). A negative result must be combined with clinical observations, patient history,  and epidemiological information. The expected result is Negative.  Fact Sheet for Patients:  PinkCheek.be  Fact Sheet for Healthcare Providers:  GravelBags.it  This test is no t yet approved or cleared by the [place] FDA and  has been authorized for detection and/or diagnosis of SARS-CoV-2 by FDA under an Emergency Use Authorization (EUA). This EUA will remain  in effect (meaning this test can be used) for the duration of the COVID-19 declaration under Section 564(b)(1) of the Act, 21 U.S.C. section 360bbb-3(b)(1), unless the authorization is terminated or revoked sooner.     Influenza A by PCR NEGATIVE NEGATIVE Final   Influenza B by PCR NEGATIVE NEGATIVE Final    Comment: (NOTE) The Xpert Xpress SARS-CoV-2/FLU/RSV assay is intended as  an aid in  the diagnosis of influenza from Nasopharyngeal swab specimens and  should not be used as a sole basis for treatment. Nasal washings and  aspirates are unacceptable for Xpert Xpress SARS-CoV-2/FLU/RSV  testing.  Fact Sheet for Patients: PinkCheek.be  Fact Sheet for Healthcare Providers: GravelBags.it  This test is not yet approved or cleared by the [place] FDA and  has been authorized for detection and/or diagnosis of SARS-CoV-2 by  FDA under an Emergency Use Authorization (EUA). This EUA will remain  in effect (meaning this test can be used) for the duration of the  Covid-19 declaration under Section 564(b)(1) of the Act, 21  U.S.C. section 360bbb-3(b)(1), unless the authorization is  terminated or revoked. Performed at [facility], [place] [address], [place], [place] [address]   Blood culture (routine x 2)     Status: None   Collection Time: 04/28/20 11:38 PM   Specimen: BLOOD RIGHT FOREARM  Result Value Ref Range Status   Specimen Description BLOOD RIGHT FOREARM  Final   Special Requests   Final      BOTTLES DRAWN AEROBIC AND ANAEROBIC Blood Culture results may not be optimal due to an inadequate volume of blood received in culture bottles   Culture   Final    NO GROWTH 5 DAYS Performed at [facility], [place] [address] [place], [place] [address]    Report Status 05/04/2020 FINAL  Final  Blood culture (routine x 2)     Status: None   Collection Time: 04/28/20 11:50 PM   Specimen: BLOOD LEFT HAND  Result Value Ref Range Status   Specimen Description BLOOD LEFT HAND  Final   Special Requests   Final    BOTTLES DRAWN AEROBIC AND ANAEROBIC Blood Culture results may not be optimal due to an inadequate volume of blood received in culture bottles   Culture   Final    NO GROWTH 5 DAYS Performed at [facility], [place] [address], [place], [place] [address]    Report Status 05/04/2020 FINAL  Final    Radiology Reports CT FOOT LEFT WO CONTRAST  Result Date: 04/29/2020 CLINICAL DATA:  Question of osteomyelitis EXAM: CT OF THE LEFT FOOT WITHOUT CONTRAST TECHNIQUE: Multidetector CT imaging of the left foot was performed according to the standard protocol. Multiplanar CT image reconstructions were also generated. COMPARISON:  None. FINDINGS: Bones/Joint/Cartilage The patient is status post transmetatarsal first through fifth digit amputation. There is no definite areas of cortical destruction or periosteal reaction noted. The amputation sites appear to be grossly intact. 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 Calcaneal enthesophyte seen on the plantar surface. There is diffuse osteopenia. Ligaments Suboptimally assessed by CT. Muscles and Tendons There is diffuse fatty atrophy of the muscles surrounding the forefoot. The flexor extensor tendons appear to be grossly intact. The plantar fascia appears to be intact. Soft tissues Overlying the plantar the near the first metatarsal amputation site there is a focal area of superficial ulceration with diffuse skin  thickening and subcutaneous edema. No definite loculated fluid collection or subcutaneous emphysema is seen. IMPRESSION: Superficial ulceration along the plantar base beneath the first metatarsal amputation site with cellulitis and postsurgical changes. No definite loculated fluid collection or subcutaneous emphysema. Status post transmetatarsal amputation of the first through fifth digits without definite evidence of osteomyelitis. Electronically Signed   By: Prudencio Pair M.D.   On: 04/29/2020 03:30   DG Chest Port 1 View  Result Date:  04/28/2020 CLINICAL DATA:  Chest pain, weakness and dizziness. EXAM: PORTABLE CHEST 1 VIEW COMPARISON:  01/20/2020 FINDINGS: Mild patient rotation. Multi lead left-sided pacemaker. Cardiomegaly slightly increased from prior exam. Unchanged mediastinal contours. Aortic atherosclerosis. Mild vascular engorgement. No pulmonary edema. No focal airspace disease. No large pleural effusion. No pneumothorax. The bones appear under mineralized. Spinal stimulator in place. Multiple overlying monitoring devices. No acute osseous abnormalities are seen. IMPRESSION: Cardiomegaly with mild vascular congestion. Electronically Signed   By: Keith Rake M.D.   On: 04/28/2020 21:40   ECHOCARDIOGRAM COMPLETE  Result Date: 04/29/2020    ECHOCARDIOGRAM REPORT   Patient Name:   Tim [name] Date of Exam: 04/29/2020 Medical Rec #:  [id number]       Height:       72.0 in Accession #:    [id number]      Weight:       320.0 lb Date of Birth:  [birth year]       BSA:          2.602 m Patient Age:    63 years        BP:           127/96 mmHg Patient Gender: M               HR:           66 bpm. Exam Location:  Inpatient Procedure: 2D Echo and Intracardiac Opacification Agent Indications:    endocarditis  History:        Patient has prior history of Echocardiogram examinations, most                 recent 01/16/2020. CAD, Defibrillator, COPD,                 Signs/Symptoms:Fever; Risk Factors:Sleep  Apnea.  Sonographer:    Johny Chess Referring Phys: Graylin Shiver [facility]  Sonographer Comments: Suboptimal parasternal window and patient is morbidly obese. Image acquisition challenging due to patient body habitus. IMPRESSIONS  1. Left ventricular ejection fraction, by estimation, is 55 to 60%. The left ventricle has normal function. The left ventricle has no regional wall motion abnormalities. Left ventricular diastolic parameters are consistent with Grade I diastolic dysfunction (impaired relaxation).  2. Right ventricular systolic function is normal. The right ventricular size is mildly enlarged. Tricuspid regurgitation signal is inadequate for assessing PA pressure.  3. Left atrial size was mildly dilated.  4. The mitral valve is degenerative. No evidence of mitral valve regurgitation. Mild mitral stenosis. The mean mitral valve gradient is 5.0 mmHg. Moderate mitral annular calcification. There is a 1.3 x 1 cm mobile vegetation attached to the anterior mitral leaflet.  5. The aortic valve was not well visualized. Aortic valve regurgitation is not visualized. Mild aortic valve stenosis. Aortic valve mean gradient measures 16.0 mmHg.  6. The inferior vena cava is normal in size with greater than 50% respiratory variability, suggesting right atrial pressure of 3 mmHg.  7. Technically difficult study with very poor windows. Possible mitral valve vegetation. The tricuspid valve, aortic valve, and ICD were poorly visualized. Suggest TEE. FINDINGS  Left Ventricle: Left ventricular ejection fraction, by estimation, is 55 to 60%. The left ventricle has normal function. The left ventricle has no regional wall motion abnormalities. Definity contrast agent was given IV to delineate the left ventricular  endocardial borders. The left ventricular internal cavity size was normal in size. There is no left ventricular hypertrophy. Left ventricular diastolic parameters are consistent with  Grade I diastolic dysfunction  (impaired relaxation). Right Ventricle: The right ventricular size is mildly enlarged. No increase in right ventricular wall thickness. Right ventricular systolic function is normal. Tricuspid regurgitation signal is inadequate for assessing PA pressure. Left Atrium: Left atrial size was mildly dilated. Right Atrium: Right atrial size was normal in size. Pericardium: There is no evidence of pericardial effusion. Mitral Valve: The mitral valve is degenerative in appearance. There is moderate thickening of the mitral valve leaflet(s). Moderate mitral annular calcification. No evidence of mitral valve regurgitation. Mild mitral valve stenosis. MV peak gradient, 10.1 mmHg. The mean mitral valve gradient is 5.0 mmHg. Tricuspid Valve: The tricuspid valve is not well visualized. Tricuspid valve regurgitation is not demonstrated. Aortic Valve: The aortic valve was not well visualized. Aortic valve regurgitation is not visualized. Mild aortic stenosis is present. Aortic valve mean gradient measures 16.0 mmHg. Aortic valve peak gradient measures 30.9 mmHg. Pulmonic Valve: The pulmonic valve was not well visualized. Pulmonic valve regurgitation is not visualized. Aorta: Aortic root could not be assessed. Venous: The inferior vena cava is normal in size with greater than 50% respiratory variability, suggesting right atrial pressure of 3 mmHg. IAS/Shunts: No atrial level shunt detected by color flow Doppler. Additional Comments: A pacer wire is visualized in the right ventricle.   Diastology LV e' medial:    6.74 cm/s LV E/e' medial:  18.4 LV e' lateral:   8.05 cm/s LV E/e' lateral: 15.4  RIGHT VENTRICLE             IVC RV S prime:     15.60 cm/s  IVC diam: 1.70 cm TAPSE (M-mode): 3.0 cm LEFT ATRIUM             Index LA Vol (A2C):   78.1 ml 30.02 ml/m LA Vol (A4C):   90.2 ml 34.67 ml/m LA Biplane Vol: 91.5 ml 35.17 ml/m  AORTIC VALVE AV Vmax:           278.00 cm/s AV Vmean:          182.000 cm/s AV VTI:            0.576 m AV  Peak Grad:      30.9 mmHg AV Mean Grad:      16.0 mmHg LVOT Vmax:         119.00 cm/s LVOT Vmean:        81.800 cm/s LVOT VTI:          0.236 m LVOT/AV VTI ratio: 0.41 MITRAL VALVE MV Area (PHT): 2.05 cm     SHUNTS MV Peak grad:  10.1 mmHg    Systemic VTI: 0.24 m MV Mean grad:  5.0 mmHg MV Vmax:       1.59 m/s MV Vmean:      100.0 cm/s MV Decel Time: 370 msec MV E velocity: 124.00 cm/s MV A velocity: 132.00 cm/s MV E/A ratio:  0.94 Loralie Champagne MD Electronically signed by Loralie Champagne MD Signature Date/Time: 04/29/2020/6:03:22 PM    Final    [name] EKG SITE RITE  Result Date: 04/30/2020 If Site Rite image not attached, placement could not be confirmed due to current cardiac rhythm.

## 2020-05-06 NOTE — Anesthesia Procedure Notes (Signed)
Procedure Name: MAC Date/Time: 05/06/2020 10:45 AM Performed by: Amadeo Garnet, CRNA Pre-anesthesia Checklist: Patient identified, Suction available, Emergency Drugs available and Patient being monitored Patient Re-evaluated:Patient Re-evaluated prior to induction Oxygen Delivery Method: Nasal cannula Preoxygenation: Pre-oxygenation with 100% oxygen Induction Type: IV induction Placement Confirmation: positive ETCO2 Dental Injury: Teeth and Oropharynx as per pre-operative assessment

## 2020-05-06 NOTE — Progress Notes (Signed)
[facility] for Infectious Disease   Reason for visit: Follow up on MV endocarditis  Interval History: TEE again unsuccessful so aborted and no plans to try again.  Blood cultures remained negative.  No fever since admission and no leukocytosis since admission.   Ampicillin/ceftriaxone day 9   Physical Exam: Constitutional:  Vitals:   05/06/20 1123 05/06/20 1151  BP: (!) 132/46 128/73  Pulse: 60 62  Resp: 13 16  Temp:  97.8 F (36.6 C)  SpO2: 99%    patient appears in NAD Respiratory: Normal respiratory effort; CTA B Cardiovascular: RRR GI: soft, nt, nd  Review of Systems: Constitutional: negative for fevers and chills Gastrointestinal: negative for diarrhea Integument/breast: negative for rash  Lab Results  Component Value Date   WBC 5.6 05/06/2020   HGB 10.1 (L) 05/06/2020   HCT 31.0 (L) 05/06/2020   MCV 90.9 05/06/2020   PLT 129 (L) 05/06/2020    Lab Results  Component Value Date   CREATININE 1.05 05/06/2020   BUN 13 05/06/2020   NA 141 05/06/2020   K 3.4 (L) 05/06/2020   CL 103 05/06/2020   CO2 29 05/06/2020    Lab Results  Component Value Date   ALT 20 05/06/2020   AST 20 05/06/2020   ALKPHOS 48 05/06/2020     Microbiology: Recent Results (from the past 240 hour(s))  Respiratory Panel by RT PCR (Flu A&B, Covid) - Nasopharyngeal Swab     Status: None   Collection Time: 04/28/20 10:43 PM   Specimen: Nasopharyngeal Swab  Result Value Ref Range Status   SARS Coronavirus 2 by RT PCR NEGATIVE NEGATIVE Final    Comment: (NOTE) SARS-CoV-2 target nucleic acids are NOT DETECTED.  The SARS-CoV-2 RNA is generally detectable in upper respiratoy specimens during the acute phase of infection. The lowest concentration of SARS-CoV-2 viral copies this assay can detect is 131 copies/mL. A negative result does not preclude SARS-Cov-2 infection and should not be used as the sole basis for treatment or other patient management decisions. A negative result  may occur with  improper specimen collection/handling, submission of specimen other than nasopharyngeal swab, presence of viral mutation(s) within the areas targeted by this assay, and inadequate number of viral copies (<131 copies/mL). A negative result must be combined with clinical observations, patient history, and epidemiological information. The expected result is Negative.  Fact Sheet for Patients:  PinkCheek.be  Fact Sheet for Healthcare Providers:  GravelBags.it  This test is no t yet approved or cleared by the [place] FDA and  has been authorized for detection and/or diagnosis of SARS-CoV-2 by FDA under an Emergency Use Authorization (EUA). This EUA will remain  in effect (meaning this test can be used) for the duration of the COVID-19 declaration under Section 564(b)(1) of the Act, 21 U.S.C. section 360bbb-3(b)(1), unless the authorization is terminated or revoked sooner.     Influenza A by PCR NEGATIVE NEGATIVE Final   Influenza B by PCR NEGATIVE NEGATIVE Final    Comment: (NOTE) The Xpert Xpress SARS-CoV-2/FLU/RSV assay is intended as an aid in  the diagnosis of influenza from Nasopharyngeal swab specimens and  should not be used as a sole basis for treatment. Nasal washings and  aspirates are unacceptable for Xpert Xpress SARS-CoV-2/FLU/RSV  testing.  Fact Sheet for Patients: PinkCheek.be  Fact Sheet for Healthcare Providers: GravelBags.it  This test is not yet approved or cleared by the [place] FDA and  has been authorized for detection and/or diagnosis of SARS-CoV-2  by  FDA under an Emergency Use Authorization (EUA). This EUA will remain  in effect (meaning this test can be used) for the duration of the  Covid-19 declaration under Section 564(b)(1) of the Act, 21  U.S.C. section 360bbb-3(b)(1), unless the authorization is  terminated  or revoked. Performed at [facility], [place] [address], [place], [place] [address]   Blood culture (routine x 2)     Status: None   Collection Time: 04/28/20 11:38 PM   Specimen: BLOOD RIGHT FOREARM  Result Value Ref Range Status   Specimen Description BLOOD RIGHT FOREARM  Final   Special Requests   Final    BOTTLES DRAWN AEROBIC AND ANAEROBIC Blood Culture results may not be optimal due to an inadequate volume of blood received in culture bottles   Culture   Final    NO GROWTH 5 DAYS Performed at [facility], [place] [address] [place], [place] [address]    Report Status 05/04/2020 FINAL  Final  Blood culture (routine x 2)     Status: None   Collection Time: 04/28/20 11:50 PM   Specimen: BLOOD LEFT HAND  Result Value Ref Range Status   Specimen Description BLOOD LEFT HAND  Final   Special Requests   Final    BOTTLES DRAWN AEROBIC AND ANAEROBIC Blood Culture results may not be optimal due to an inadequate volume of blood received in culture bottles   Culture   Final    NO GROWTH 5 DAYS Performed at [facility], [place] [address], [place], [place] [address]    Report Status 05/04/2020 FINAL  Final    Impression/Plan:  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   He will need 6 weeks of IV treatment with above ceftriaxone and ampicillin   2.  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   He will be treated as above and will have him take amoxicillin 1000 mg twice a day for long term suppression after that.   3.  Medication monitoring - per home health protocol.   Diagnosis: Native MV endocarditis  Culture Result: Previous culture with Enterococcus  Allergies  Allergen Reactions  . Brimonidine Tartrate Shortness Of Breath    Alphagan-Shortness of breath  . Brinzolamide Shortness Of Breath     AZOPT- Shortness of breath  . Latanoprost Shortness Of Breath    XALATAN- Shortness of breath  . Nucynta [Tapentadol] Shortness Of Breath  . Sulfa Antibiotics Palpitations  . Timolol Maleate Shortness Of Breath and Other (See Comments)    TIMOPTIC- Aggravated asthma  . Diltiazem Swelling     leg swelling  . Rofecoxib Swelling     VIOXX- leg swelling  . Vancomycin Hives and Other (See Comments)    Possible "Red Man Syndrome"? > hives/blisters  . Codeine Other (See Comments)    Childhood reaction  . Tamsulosin Other (See Comments)    Dizziness   . Celecoxib Other (See Comments)    CELLBREX-confusion  . Colchicine Diarrhea    diarrhea  . Tape Rash    OPAT Orders Discharge antibiotics to be given via PICC line Discharge antibiotics: ceftriaxone + ampicillin Per pharmacy protocol yes Duration: 6 weeks End Date: Dec 7th   [place] Per Protocol: yes  Home health RN for IV administration and teaching; PICC line care and  labs.    Labs weekly while on IV antibiotics: _x_ CBC with differential __ BMP _x_ CMP __ CRP __ ESR __ Vancomycin trough __ CK  _x_ Please pull PIC at completion of IV antibiotics __ Please leave PIC in place until doctor has seen patient or been notified  Fax weekly labs to [phone]  Clinic Follow Up Appt: 11/22 with Dr. Tommy Medal  @10 :45

## 2020-05-07 DIAGNOSIS — T827XXD Infection and inflammatory reaction due to other cardiac and vascular devices, implants and grafts, subsequent encounter: Secondary | ICD-10-CM | POA: Diagnosis not present

## 2020-05-07 DIAGNOSIS — R7881 Bacteremia: Secondary | ICD-10-CM | POA: Diagnosis not present

## 2020-05-07 DIAGNOSIS — I330 Acute and subacute infective endocarditis: Secondary | ICD-10-CM | POA: Diagnosis not present

## 2020-05-07 DIAGNOSIS — I2510 Atherosclerotic heart disease of native coronary artery without angina pectoris: Secondary | ICD-10-CM | POA: Diagnosis not present

## 2020-05-07 LAB — COMPREHENSIVE METABOLIC PANEL
ALT: 19 U/L (ref 0–44)
AST: 21 U/L (ref 15–41)
Albumin: 3 g/dL — ABNORMAL LOW (ref 3.5–5.0)
Alkaline Phosphatase: 47 U/L (ref 38–126)
Anion gap: 10 (ref 5–15)
BUN: 13 mg/dL (ref 8–23)
CO2: 30 mmol/L (ref 22–32)
Calcium: 8.8 mg/dL — ABNORMAL LOW (ref 8.9–10.3)
Chloride: 102 mmol/L (ref 98–111)
Creatinine, Ser: 1.11 mg/dL (ref 0.61–1.24)
GFR, Estimated: >60 mL/min (ref >60)
Glucose, Bld: 106 mg/dL — ABNORMAL HIGH (ref 70–99)
Potassium: 3.5 mmol/L (ref 3.5–5.1)
Sodium: 142 mmol/L (ref 135–145)
Total Bilirubin: 0.4 mg/dL (ref 0.3–1.2)
Total Protein: 5.9 g/dL — ABNORMAL LOW (ref 6.5–8.1)

## 2020-05-07 LAB — CBC WITH DIFFERENTIAL/PLATELET
Abs Immature Granulocytes: 0.02 10*3/uL (ref 0.00–0.07)
Basophils Absolute: 0.1 10*3/uL (ref 0.0–0.1)
Basophils Relative: 1 %
Eosinophils Absolute: 0.2 10*3/uL (ref 0.0–0.5)
Eosinophils Relative: 4 %
HCT: 32.1 % — ABNORMAL LOW (ref 39.0–52.0)
Hemoglobin: 10.2 g/dL — ABNORMAL LOW (ref 13.0–17.0)
Immature Granulocytes: 0 %
Lymphocytes Relative: 30 %
Lymphs Abs: 1.6 10*3/uL (ref 0.7–4.0)
MCH: 28.8 pg (ref 26.0–34.0)
MCHC: 31.8 g/dL (ref 30.0–36.0)
MCV: 90.7 fL (ref 80.0–100.0)
Monocytes Absolute: 0.6 10*3/uL (ref 0.1–1.0)
Monocytes Relative: 10 %
Neutro Abs: 3 10*3/uL (ref 1.7–7.7)
Neutrophils Relative %: 55 %
Platelets: 133 10*3/uL — ABNORMAL LOW (ref 150–400)
RBC: 3.54 MIL/uL — ABNORMAL LOW (ref 4.22–5.81)
RDW: 14.1 % (ref 11.5–15.5)
WBC: 5.5 10*3/uL (ref 4.0–10.5)
nRBC: 0 % (ref 0.0–0.2)

## 2020-05-07 LAB — PROCALCITONIN: Procalcitonin: <0.1 ng/mL

## 2020-05-07 LAB — GLUCOSE, CAPILLARY
Glucose-Capillary: 119 mg/dL — ABNORMAL HIGH (ref 70–99)
Glucose-Capillary: 111 mg/dL — ABNORMAL HIGH (ref 70–99)
Glucose-Capillary: 112 mg/dL — ABNORMAL HIGH (ref 70–99)

## 2020-05-07 LAB — MAGNESIUM: Magnesium: 1.7 mg/dL (ref 1.7–2.4)

## 2020-05-07 LAB — BRAIN NATRIURETIC PEPTIDE: B Natriuretic Peptide: 41.9 pg/mL (ref 0.0–100.0)

## 2020-05-07 MED ORDER — AMPICILLIN IV (FOR PTA / DISCHARGE USE ONLY): 12.0000 g | INTRAVENOUS | 0 refills | Status: AC

## 2020-05-07 MED ORDER — ALTEPLASE 2 MG IJ SOLR
2.0000 mg | Freq: Once | INTRAMUSCULAR | Status: AC
  Administered 2020-05-07: 2 mg

## 2020-05-07 MED ORDER — CEFTRIAXONE IV (FOR PTA / DISCHARGE USE ONLY): 2.0000 g | Freq: Two times a day (BID) | INTRAVENOUS | 0 refills | Status: AC

## 2020-05-07 MED ORDER — SPIRONOLACTONE 25 MG PO TABS: 12.5000 mg | ORAL_TABLET | Freq: Every day | ORAL | 0 refills | Status: DC

## 2020-05-07 MED ORDER — TORSEMIDE 20 MG PO TABS: 20.0000 mg | ORAL_TABLET | Freq: Two times a day (BID) | ORAL | 0 refills | Status: DC

## 2020-05-07 MED ORDER — MOMETASONE FURO-FORMOTEROL FUM 100-5 MCG/ACT IN AERO: 2.0000 | INHALATION_SPRAY | Freq: Two times a day (BID) | RESPIRATORY_TRACT | 0 refills | Status: DC

## 2020-05-07 MED ORDER — MAGNESIUM OXIDE 400 (241.3 MG) MG PO TABS: 400.0000 mg | ORAL_TABLET | Freq: Every day | ORAL | 0 refills | Status: AC

## 2020-05-07 NOTE — Progress Notes (Signed)
Physical Therapy Treatment Patient Details Name: Tim [name] MRN: [id number] DOB: 06/16/1941 Today's Date: 05/07/2020    History of Present Illness 79 y.o. 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    PT Comments    Pt seated in recliner.  Eager to ambulate.  Performed gt and stair training with decreased SHOB.  Plan for return home with support from his spouse.    Follow Up Recommendations  No PT follow up;Supervision - Intermittent     Equipment Recommendations  None recommended by PT    Recommendations for Other Services       Precautions / Restrictions Precautions Precautions: Fall Restrictions Weight Bearing Restrictions: No    Mobility  Bed Mobility               General bed mobility comments: Pt seated in recliner on arrival.  Transfers Overall transfer level: Needs assistance Equipment used: 4-wheeled walker (wide) Transfers: Sit to/from Stand Sit to Stand: Supervision         General transfer comment: Cues for hand placement to and from seated surface.  Ambulation/Gait Ambulation/Gait assistance: Supervision Gait Distance (Feet): 200 Feet Assistive device: 4-wheeled walker (wide) Gait Pattern/deviations: Step-through pattern;Trunk flexed;Shuffle Gait velocity: reduced   General  Gait Details: Pt with kyphotic posturing at baseline.  Pt fatigues with activity and required seated rest break between trials.   Stairs Stairs: Yes Stairs assistance: Supervision Stair Management: Two rails;Forwards;Backwards;Step to pattern Number of Stairs: 4 General stair comments: Cues for sequencing and hand placement on B rails.  Pt with decreased SHOB post session.   Wheelchair Mobility    Modified Rankin (Stroke Patients Only)       Balance Overall balance assessment: Needs assistance Sitting-balance support: No upper extremity supported;Feet supported Sitting balance-Leahy Scale: Good Sitting balance - Comments: supervision     Standing balance-Leahy Scale: Fair Standing balance comment: reliant on UE support of assistive device                            Cognition Arousal/Alertness: Awake/alert Behavior During Therapy: WFL for tasks assessed/performed Overall Cognitive Status: Within Functional Limits for tasks assessed                                        Exercises      General Comments        Pertinent Vitals/Pain Pain Assessment: Faces Faces Pain Scale: Hurts a little bit Pain Location: back, chronic Pain Descriptors / Indicators: Aching Pain Intervention(s): Monitored during session;Repositioned    Home Living                      Prior Function            PT Goals (current goals  can now be found in the care plan section) Acute Rehab PT Goals Patient Stated Goal: To return home and regain mobility at [facility] level. Potential to Achieve Goals: Good Progress towards PT goals: Progressing toward goals    Frequency    Min 3X/week      PT Plan Current plan remains appropriate    Co-evaluation              AM-PAC PT "6 Clicks" Mobility   Outcome Measure  Help needed turning from your back to your side while in a flat bed without using bedrails?: None Help needed moving from lying on your  back to sitting on the side of a flat bed without using bedrails?: None Help needed moving to and from a bed to a chair (including a wheelchair)?: A Little Help needed standing up from a chair using your arms (e.g., wheelchair or bedside chair)?: A Little Help needed to walk in hospital room?: A Little Help needed climbing 3-5 steps with a railing? : A Little 6 Click Score: 20    End of Session Equipment Utilized During Treatment: Gait belt Activity Tolerance: Patient tolerated treatment well Patient left: with call bell/phone within reach;with family/visitor present;in chair Nurse Communication: Mobility status PT Visit Diagnosis: Other abnormalities of gait and mobility (R26.89);Muscle weakness (generalized) (M62.81)     Time: 2811-8867 PT Time Calculation (min) (ACUTE ONLY): 21 min  Charges:  $Gait Training: 8-22 mins                     Tim [name] , PTA Acute Rehabilitation Services Pager [phone] Office [phone]     Tim [name] Tim [name] 05/07/2020, 11:41 AM

## 2020-05-07 NOTE — TOC Transition Note (Addendum)
Transition of Care (TOC) - CM/SW Discharge Note Marvetta Gibbons RN, BSN Transitions of Care Unit 4E- RN Case Manager See Treatment Team for direct phone #    Patient Details  Name: Yania Bogie PATRONE MRN: [id number] Date of Birth: 12/14/1940  Transition of Care [facility]) CM/SW Contact:  Dawayne Patricia, RN Phone Number: 05/07/2020, 11:37 AM   Clinical Narrative:    Have confirmed with Sharrie Rothman at [facility] infusion this am that referral has been accepted- pt has zero copay for infusions. Sharrie Rothman has come by to meet pt at bedside and education done for home infusion needs. [facility] will provide [facility] for PICC line care and lab draws. [facility] has all orders needed for home infusion needs.  Per [place] RN pt will do own drsg changes to leg wounds and will not need HHRN for wound care at home. Pt will f/u with Dr. Sharol Given on wounds. Bedside RN to remove leg wraps prior to discharge.    Final next level of care: [place] Barriers to Discharge: Barriers Resolved   Patient Goals and CMS Choice Patient states their goals for this hospitalization and ongoing recovery are:: return home CMS Medicare.gov Compare Post Acute Care list provided to:: Patient Choice offered to / list presented to : Patient, Spouse  Discharge Placement               Home with HH/[facility]        Discharge Plan and Services   Discharge Planning Services: CM Consult Post Acute Care Choice: Home Health, Resumption of Svcs/PTA Provider                    HH Arranged: RN, IV Antibiotics HH Agency: Other - See comment ([facility]) Date HH Agency Contacted: 05/06/20 Time HH Agency Contacted: 1630 Representative spoke with at [place]: [place] ([place]) Interventions     Readmission Risk Interventions Readmission Risk Prevention Plan 05/07/2020 11/28/2019  Transportation Screening Complete Complete  PCP or Specialist Appt within 3-5 Days Complete -  HRI or [place] Complete Complete  Social Work Consult for [place] Planning/Counseling Complete Complete  Palliative Care Screening Not Applicable Not Applicable  Medication Review Press photographer) Complete Referral to Pharmacy  Some recent data might be hidden

## 2020-05-07 NOTE — Progress Notes (Signed)
PHARMACY CONSULT NOTE FOR:  OUTPATIENT  PARENTERAL ANTIBIOTIC THERAPY (OPAT)  Indication: Enterococcal endocarditis Regimen: Ampicillin 12 gm IV as a continuous infusion + ceftriaxone 2 gm IV q 12 hours  End date:  06/08/2020  IV antibiotic discharge orders are pended. To discharging provider:  please sign these orders via discharge navigator,  Select New Orders & click on the button choice - Manage This Unsigned Work.     Thank you for allowing pharmacy to be a part of this patient's care.  Jimmy Footman, PharmD, BCPS, BCIDP Infectious Diseases Clinical Pharmacist Phone: [phone] 05/07/2020, 9:04 AM

## 2020-05-07 NOTE — Anesthesia Postprocedure Evaluation (Signed)
Anesthesia Post Note  Patient: Tim [name]  Procedure(s) Performed: INVASIVE LAB ABORTED CASE     Patient location during evaluation: Endoscopy Anesthesia Type: MAC Level of consciousness: awake and alert Pain management: pain level controlled Vital Signs Assessment: post-procedure vital signs reviewed and stable Respiratory status: spontaneous breathing, nonlabored ventilation, respiratory function stable and patient connected to nasal cannula oxygen Cardiovascular status: blood pressure returned to baseline and stable Postop Assessment: no apparent nausea or vomiting Anesthetic complications: no   No complications documented.  Last Vitals:  Vitals:   05/07/20 0733 05/07/20 0804  BP: (!) 139/57   Pulse: 71   Resp: 18   Temp: 36.6 C   SpO2: 97% 97%    Last Pain:  Vitals:   05/07/20 0733  TempSrc: Oral  PainSc:                  Roy S

## 2020-05-07 NOTE — Progress Notes (Signed)
Progress Note  Patient Name: Tim [name] Date of Encounter: 05/07/2020  [facility] Cardiologist: Kirk Ruths, MD   Subjective   79 y.o. 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  Unable to pass probe for TEE 05/03/20; Esophageal dilation 05/04/20.  Cultures negative through course  No complaints today. Doing well with PT,  Antibiotic planning with ID.  Inpatient Medications    Scheduled Meds:  allopurinol  100 mg Oral BID   apixaban  5 mg Oral BID   Chlorhexidine Gluconate Cloth  6 each Topical Daily   ezetimibe  10 mg Oral Daily   finasteride  5 mg Oral Daily   fluticasone  1 spray Each Nare Daily   gabapentin  800 mg Oral QID   insulin aspart  0-9 Units Subcutaneous TID WC   isosorbide mononitrate  30 mg Oral Daily   magnesium oxide  400 mg Oral Daily   metoprolol succinate  25 mg Oral Daily   mometasone-formoterol  2 puff Inhalation BID    morphine injection  2 mg Intravenous Once   pantoprazole  40 mg Oral Q1200   rosuvastatin  40 mg Oral QHS   sacubitril-valsartan  1 tablet Oral BID   sodium chloride flush  10-40 mL Intracatheter Q12H   spironolactone  12.5 mg Oral Daily   torsemide  20 mg Oral Daily   Continuous Infusions:  ampicillin (OMNIPEN) IV 2 g (05/07/20 0826)   cefTRIAXone (ROCEPHIN)  IV 2 g (05/07/20 1110)   PRN Meds: albuterol, nitroGLYCERIN, sodium chloride, sodium chloride flush   Vital Signs      Vitals:   05/07/20 0525 05/07/20 0733 05/07/20 0804 05/07/20 1108  BP: 126/63 (!) 139/57  115/61  Pulse:  71  65  Resp: 14 18  20   Temp: 98.2 F (36.8 C) 97.8 F (36.6 C)  97.6 F (36.4 C)  TempSrc: Oral Oral  Oral  SpO2: 98% 97% 97% 98%  Weight:      Height:        Intake/Output Summary (Last 24 hours) at 05/07/2020 1149 Last data filed at 05/07/2020 0600 Gross per 24 hour  Intake 200 ml  Output 1250 ml  Net -1050 ml   Last 3 Weights 05/07/2020 05/06/2020 05/06/2020  Weight (lbs) 318 lb 1.6 oz 321 lb 14 oz 321 lb 14 oz  Weight (kg) 144.289 kg 146 kg 146 kg      Telemetry    Paced rhythm- Personally Reviewed  ECG    No new tracings - Personally Reviewed  Physical Exam   GEN: Obese male in no acute distress.   Neck: No JVD Cardiac: RRR, 2/6 systolic murmur.  Respiratory: Clear to auscultation bilaterally. GI: Soft, nontender, non-distended  MS: legs wrapped Neuro:  Nonfocal  Psych: Normal affect   Labs    High Sensitivity Troponin:   Recent Labs  Lab 04/28/20 2118 04/28/20 2336 04/29/20 0013 04/29/20 0759 05/01/20 1748  TROPONINIHS 11 16 15  15 8      Chemistry Recent Labs  Lab 05/05/20 0418 05/06/20 0421 05/07/20 0501  NA 142 141 142  K 3.7 3.4* 3.5  CL 104 103 102  CO2 28 29 30   GLUCOSE 122* 133* 106*  BUN 10 13 13   CREATININE 1.05 1.05 1.11  CALCIUM 8.7* 8.7* 8.8*  PROT 6.2* 5.8* 5.9*  ALBUMIN 3.0* 2.9* 3.0*  AST 23 20 21   ALT 22 20 19   ALKPHOS 47 48 47  BILITOT 0.5 0.6 0.4  GFRNONAA >60 >60 >60  ANIONGAP 10 9 10      Hematology Recent Labs  Lab 05/05/20 0418 05/06/20 0421 05/07/20 0501  WBC 6.4 5.6 5.5  RBC 3.58* 3.41* 3.54*  HGB 10.7* 10.1* 10.2*  HCT 32.7* 31.0* 32.1*  MCV 91.3 90.9 90.7  MCH 29.9 29.6 28.8  MCHC 32.7 32.6 31.8  RDW 13.9 14.2 14.1  PLT 136* 129* 133*    BNP Recent Labs  Lab 05/05/20 0419 05/06/20 0421 05/07/20 0501  BNP 87.1 50.1 41.9     DDimer  Recent Labs  Lab 05/01/20 0319  05/02/20 0241 05/03/20 0350  DDIMER 0.79* 0.67* 1.30*     Radiology    No results found.  Cardiac Studies   Echo 04/29/20: 1. Left ventricular ejection fraction, by estimation, is 55 to 60%. The  left ventricle has normal function. The left ventricle has no regional  wall motion abnormalities. Left ventricular diastolic parameters are  consistent with Grade I diastolic  dysfunction (impaired relaxation).   2. Right ventricular systolic function is normal. The right ventricular  size is mildly enlarged. Tricuspid regurgitation signal is inadequate for  assessing PA pressure.   3. Left atrial size was mildly dilated.   4. The mitral valve is degenerative. No evidence of mitral valve  regurgitation. Mild mitral stenosis. The mean mitral valve gradient is 5.0  mmHg. Moderate mitral annular calcification. There is a 1.3 x 1 cm mobile  vegetation attached to the anterior  mitral leaflet.   5. The aortic valve was not well visualized. Aortic valve regurgitation  is not visualized. Mild aortic valve stenosis. Aortic valve mean gradient  measures 16.0 mmHg.   6. The inferior vena cava is normal in size with greater than 50%  respiratory variability, suggesting right atrial pressure of 3 mmHg.   7. Technically difficult study with very poor windows. Possible mitral  valve vegetation. The tricuspid valve, aortic valve, and ICD were poorly  visualized. Suggest TEE.   Patient Profile     79 y.o. 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 Unable to complete TEE 05/03/20 due to obstruction. GI consulted, underwent EGD with dilation 05/04/20.  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 ([employer]) involvement CHB - PPM dependent Known PFO Prior osteomyelitis - TEE 12/02/19 with vegetation on  TAVR valve and RV pacer lead - TTE this admission with poor windows - Failed two TEE's this admission for inability to pass scope past 30 cm.  Recommended prophylactic treatment for endocarditis; deferring to ID  - ICD interrogated: ERI 3.5 years, underlying rhythm is CHB, pt is PPM dependent  Ischemic cardiomyopathy BiV ICD in place Chronic systolic and diastolic heart failure DM HTN - optivol showed increased thoracic impedence - pt states breathing is at baseline - BNP 70 --> 105 --> 87 - continue low dose entresto - BP has been well-controlled - continue BB, imdur, entresto, spiro, 20 mg torsemide - continue statin and zetia   Paroxysmal atrial fibrillation Chronic anticoagulation This patients CHA2DS2-VASc Score and unadjusted Ischemic Stroke Rate (% per year) is equal to 9.7 % stroke rate/year from a score of 6 (2age, HTN, DM, CHF, CAD) - continue eliquis and BB  Will sign off at this time. Patient notes that he would like to follow up with his [facility] Cardiologist presently; happy to make follow up with Dr. Stanford Breed if this changes.  Please re-engage as new questions or concerns emerge.  For questions or updates, please contact [place] Please consult [email or web address] for contact info under       Signed, Werner Lean, MD  05/07/2020, 11:49 AM

## 2020-05-07 NOTE — Discharge Summary (Signed)
**Note Tim [name]** Physician Discharge Summary  DARNEL MCHAN HYW:[id number] DOB: 1941/02/27 DOA: 04/28/2020  PCP: Tammi Sou, MD  Admit date: 04/28/2020 Discharge date: 05/07/2020   Discharge Diagnoses:  Pt admitted this time for fevers and chills and wbc count,pt met sepsis criteria on arrival, he has had a TM Amputation of left foot , and developed bacteriemia and endocarditis of pacemaker and prosthetic valve.  with enterococcus . Pt has been on iv abx and does not wish to have surgery.     Sepsis [facility]): Pt met sepsis criteria on admission and is resolved since.  Plan is for 6 weeks of iv abx therapy.  Infective endocarditis Pt to complete 6 weeks of iv antibiotic therapy with rocephin and ampicillin. F/u with Cardiology on outpatient basis as scheduled.    Venous insufficiency (chronic) (peripheral) Cont with profore wraps.   COPD (chronic obstructive pulmonary disease) (HCC) Stable cont Dulera and prn albuterol.   CAD (coronary artery disease), native coronary artery Cont asa?statin/ BB/ ace or ARB or ARNI.  Esophageal dysphagia Cont with ppi thertapu upon widioahrege.   Discharge Condition:  Stable.  Diet recommendation:  Cardiac and low fat cab consistent.  Filed Weights   05/06/20 0641 05/06/20 0943 05/07/20 0523  Weight: (!) 146 kg (!) 146 kg (!) 144.3 kg    Discharge activity: AS Tolerated.   History of present illness:  HPI: Tim [name] is a 79 y.o. 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  2 days ago patient had 3-4 episodes of diarrhea but yesterday he had a normal bowel movement.  Denies any nausea vomiting has some sharp shooting chest pain which last for few minutes resolved at this time.  Denies any productive cough or shortness of breath.  ED Course: In the ER patient was afebrile but blood pressure was in the low normal.  Initial lactic acid was normal but repeat one was 2.  WBC count 16 hemoglobin 9.7 platelets 106 high sensitive troponins were negative EKG showing sinus tachycardia.  Patient had a blood cultures drawn and patient was started on ceftriaxone and ampicillin by ER physician to cover the previous blood cultures.  At the time of my exam patient blood pressure was slightly on the lower side for which I ordered a fluid bolus and admitted for sepsis source not clear.  On exam patient has a ulceration on the left stump which patient states off and on bleeds.  I have ordered a CT of the left foot stump.  Hospital Course:  Pt has been stable and  Admission , started on iv abx for his endocarditis as he does not want any surgical  procedure.  MV endocarditis - new finding.  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  I recommend a TEE for better evaluation of the AV and the ICD to determine if removal of ICD should be considered. 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 Pt was seen by orthopedic PA, pt was to get  TEE. PT has been stable thru his stay and  No family at bedside. HE has  had a picc placed two days ago. He is familiar  with the process and would like to be dis discharged him with iv abx as outlined by pharmacy. Per ID pt is to complete 6 weeks of iv abx therapy.   Procedures: Echo 04/29/20: 1. Left ventricular ejection fraction, by estimation, is 55 to 60%. The  left ventricle has normal function. The left ventricle has no regional  wall motion abnormalities. Left ventricular diastolic parameters are  consistent with Grade I diastolic  dysfunction (impaired relaxation).  2. Right ventricular systolic function is normal. The right ventricular  size is mildly enlarged. Tricuspid regurgitation signal is inadequate for  assessing PA pressure.  3. Left atrial size was mildly dilated.  4. The mitral valve is degenerative. No evidence of mitral valve  regurgitation. Mild mitral stenosis. The mean mitral valve gradient is 5.0  mmHg. Moderate mitral annular calcification. There is a 1.3 x 1 cm mobile  vegetation attached to the anterior  mitral leaflet.  5. The aortic valve was not well visualized. Aortic valve regurgitation  is not visualized. Mild aortic valve stenosis. Aortic valve mean gradient  measures 16.0 mmHg.  6. The inferior vena cava is normal in size with greater than 50%  respiratory variability, suggesting right atrial pressure of 3 mmHg.  7. Technically difficult study with very poor windows. Possible mitral  valve vegetation. The tricuspid valve, aortic valve, and ICD were poorly  visualized. Suggest TEE. They were not able to get tte. Pt has upper endoscopy and had zzee line 40 cm with multiple  Polyps 10-15 seen.   PICC line placed 05/01/2020.   Consultations:  Cardiology  ID Consult  Gi consult   Discharge Exam: Vitals:   05/07/20 0804 05/07/20 1108  BP:  115/61  Pulse:  65  Resp:  20  Temp:  97.6 F (36.4 C)  SpO2: 97% 98%    Physical Exam Vitals and nursing note reviewed.  Constitutional:      Appearance: He is obese.  HENT:     Head: Normocephalic and  atraumatic.     Right Ear: External ear normal.     Left Ear: External ear normal.     Nose: Nose normal.  Eyes:     Extraocular Movements: Extraocular movements intact.  Cardiovascular:     Rate and Rhythm: Normal rate and regular rhythm.     Pulses: Normal pulses.     Heart sounds: Normal heart sounds.  Pulmonary:     Effort: Pulmonary effort is normal.     Breath sounds: Normal breath sounds.  Abdominal:     General: There is no distension.     Palpations: Abdomen is soft.     Tenderness: There is no abdominal tenderness.  Neurological:     Mental Status: He is alert.      Discharge Instructions   Discharge Instructions    [facility] pharmacist to adjust dose for Vancomycin, Aminoglycosides and other anti-infective therapies as requested by physician.   Complete by: As directed    [facility] to provide Cath Flo 19m   Complete by: As directed  Administer for PICC line occlusion and as ordered by physician for other access device issues.   Anaphylaxis Kit: Provided to treat any anaphylactic reaction to the medication being provided to the patient if First Dose or when requested by physician   Complete by: As directed    Epinephrine 80m/ml vial / amp: Administer 0.376m(0.11m59msubcutaneously once for moderate to severe anaphylaxis, nurse to call physician and pharmacy when reaction occurs and call 911 if needed for immediate care   Diphenhydramine 36m9m IV vial: Administer 25-36mg34mIM PRN for first dose reaction, rash, itching, mild reaction, nurse to call physician and pharmacy when reaction occurs   Sodium Chloride 0.9% NS 500ml 58mAdminister if needed for hypovolemic blood pressure drop or as ordered by physician after call to physician with anaphylactic reaction   Change dressing on IV access line weekly and PRN   Complete by: As directed    Flush IV access with Sodium Chloride 0.9% and Heparin 10 units/ml or 100 units/ml   Complete by: As  directed    Home infusion instructions - [facility]   Complete by: As directed    Instructions: Flush IV access with Sodium Chloride 0.9% and Heparin 10units/ml or 100units/ml   Change dressing on IV access line: Weekly and PRN   Instructions Cath Flo 2mg: A67mnister for PICC Line occlusion and as ordered by physician for other access device   [facility] pharmacist to adjust dose for: Vancomycin, Aminoglycosides and other anti-infective therapies as requested by physician   Method of administration may be changed at the discretion of home infusion pharmacist based upon assessment of the patient and/or caregiver's ability to self-administer the medication ordered   Complete by: As directed      Allergies as of 05/07/2020      Reactions   Brimonidine Tartrate Shortness Of Breath   Alphagan-Shortness of breath   Brinzolamide Shortness Of Breath   AZOPT- Shortness of breath   Latanoprost Shortness Of Breath   XALATAN- Shortness of breath   Nucynta [tapentadol] Shortness Of Breath   Sulfa Antibiotics Palpitations   Timolol Maleate Shortness Of Breath, Other (See Comments)   TIMOPTIC- Aggravated asthma   Diltiazem Swelling    leg swelling   Rofecoxib Swelling    VIOXX- leg swelling   Vancomycin Hives, Other (See Comments)   Possible "Red Man Syndrome"? > hives/blisters   Codeine Other (See Comments)   Childhood reaction   Tamsulosin Other (See Comments)   Dizziness    Celecoxib Other (See Comments)   CELLBREX-confusion   Colchicine Diarrhea   diarrhea   Tape Rash      Medication List    STOP taking these medications   amoxicillin 500 MG capsule Commonly known as: AMOXIL     TAKE these medications   albuterol 108 (90 Base) MCG/ACT inhaler Commonly known as: VENTOLIN HFA Inhale 2 puffs into the lungs 4 (four) times daily as needed for wheezing or shortness of breath.   allopurinol 300 MG tablet Commonly known as: ZYLOPRIM TAKE 1 TABLET BY MOUTH  ONCE DAILY WITH FOOD   ampicillin  IVPB Inject 12 g into the vein daily. As a continuous infusion. Indication:  Enterococcal endocarditis  First Dose: yes Last Day of Therapy:  06/08/20 Labs - Once weekly:  CBC/D and BMP, Labs - Every other week:  ESR and CRP Method of administration: Ambulatory Pump (Continuous Infusion) Method of administration may be changed at the discretion of home infusion pharmacist based upon assessment  of the patient and/or caregiver's ability to self-administer the medication ordered.   Aspirin Low Dose 81 MG EC tablet Generic drug: aspirin Take 81 mg by mouth daily.   B-complex with vitamin C tablet Take 1 tablet by mouth daily. Take 1 tablet daily, 27m   cefTRIAXone  IVPB Commonly known as: ROCEPHIN Inject 2 g into the vein every 12 (twelve) hours. Indication: Enterococcal endocarditis First Dose: Yes Last Day of Therapy:  06/08/20 Labs - Once weekly:  CBC/D and BMP, Labs - Every other week:  ESR and CRP Method of administration: IV Push Method of administration may be changed at the discretion of home infusion pharmacist based upon assessment of the patient and/or caregiver's ability to self-administer the medication ordered.   Eliquis 5 MG Tabs tablet Generic drug: apixaban Take 5 mg by mouth 2 (two) times daily.   ezetimibe 10 MG tablet Commonly known as: ZETIA Take 1 tablet (10 mg total) by mouth daily.   finasteride 5 MG tablet Commonly known as: PROSCAR Take 1 tablet (5 mg total) by mouth daily.   fluticasone 50 MCG/ACT nasal spray Commonly known as: FLONASE Place 2 sprays into both nostrils as needed for allergies or rhinitis.   gabapentin 800 MG tablet Commonly known as: NEURONTIN TAKE 1 TABLET (800 MG TOTAL) BY MOUTH 4 (FOUR) TIMES DAILY.   Klor-Con M20 20 MEQ tablet Generic drug: potassium chloride SA Take 20 mEq by mouth daily.   magnesium oxide 400 (241.3 Mg) MG tablet Commonly known as: MAG-OX Take 1 tablet (400 mg total)  by mouth daily. Start taking on: May 08, 2020   metoprolol succinate 25 MG 24 hr tablet Commonly known as: TOPROL-XL Take 25 mg by mouth daily.   mometasone-formoterol 100-5 MCG/ACT Aero Commonly known as: DULERA Inhale 2 puffs into the lungs 2 (two) times daily for 60 doses.   MULTIVITAMIN ADULT PO Take by mouth daily.   niacin 1000 MG CR tablet Commonly known as: NIASPAN TAKE 1 TABLET BY MOUTH TWICE A DAY   nitroGLYCERIN 0.4 MG SL tablet Commonly known as: NITROSTAT Place 0.4 mg under the tongue every 5 (five) minutes as needed for chest pain.   pantoprazole 40 MG tablet Commonly known as: PROTONIX TAKE 1 TABLET BY MOUTH EVERY DAY   rosuvastatin 40 MG tablet Commonly known as: CRESTOR take 1 tablet by mouth once daily   sacubitril-valsartan 24-26 MG Commonly known as: ENTRESTO Take 1 tablet by mouth 2 (two) times daily.   spironolactone 25 MG tablet Commonly known as: ALDACTONE Take 0.5 tablets (12.5 mg total) by mouth daily. Start taking on: May 08, 2020   torsemide 20 MG tablet Commonly known as: DEMADEX Take 1 tablet (20 mg total) by mouth in the morning and at bedtime. What changed:   when to take this  reasons to take this   vitamin C 250 MG tablet Commonly known as: ASCORBIC ACID Take 250 mg by mouth daily.   VITAMIN D (CHOLECALCIFEROL) PO Take 1 tablet by mouth daily.   Wixela Inhub 250-50 MCG/DOSE Aepb Generic drug: Fluticasone-Salmeterol TAKE 1 PUFF BY MOUTH TWICE A DAY            Discharge Care Instructions  (From admission, onward)         Start     Ordered   05/07/20 0000  Change dressing on IV access line weekly and PRN  (Home infusion instructions - [facility] )        05/07/20 1222  Allergies  Allergen Reactions  . Brimonidine Tartrate Shortness Of Breath    Alphagan-Shortness of breath  . Brinzolamide Shortness Of Breath    AZOPT- Shortness of breath  . Latanoprost Shortness Of Breath     XALATAN- Shortness of breath  . Nucynta [Tapentadol] Shortness Of Breath  . Sulfa Antibiotics Palpitations  . Timolol Maleate Shortness Of Breath and Other (See Comments)    TIMOPTIC- Aggravated asthma  . Diltiazem Swelling     leg swelling  . Rofecoxib Swelling     VIOXX- leg swelling  . Vancomycin Hives and Other (See Comments)    Possible "Red Man Syndrome"? > hives/blisters  . Codeine Other (See Comments)    Childhood reaction  . Tamsulosin Other (See Comments)    Dizziness   . Celecoxib Other (See Comments)    CELLBREX-confusion  . Colchicine Diarrhea    diarrhea  . Tape Rash    Follow-up Information    [facility] Follow up.   Why: home IV abx needs referral - hospital rep. has seen pt at the bedside and provided contact info- home IV abx have been arranged. (RN to be provied by [facility] [employer] for PICC/lab needs)       McGowen, Adrian Blackwater, MD. Call in 1 week(s).   Specialty: Family Medicine Contact information: [address] [place] [address] [phone]        Lelon Perla, MD .   Specialty: Cardiology Contact information: [address] [place] [address] [phone]                The results of significant diagnostics from this hospitalization (including imaging, microbiology, ancillary and laboratory) are listed below for reference.    Significant Diagnostic Studies: CT FOOT LEFT WO CONTRAST  Result Date: 04/29/2020 CLINICAL DATA:  Question of osteomyelitis EXAM: CT OF THE LEFT FOOT WITHOUT CONTRAST TECHNIQUE: Multidetector CT imaging of the left foot was performed according to the standard protocol. Multiplanar CT image reconstructions were also generated. COMPARISON:  None. FINDINGS: Bones/Joint/Cartilage The patient is status post transmetatarsal first through fifth digit amputation. There is no definite areas of cortical destruction or periosteal reaction noted. The amputation sites appear to be grossly intact.  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 Calcaneal enthesophyte seen on the plantar surface. There is diffuse osteopenia. Ligaments Suboptimally assessed by CT. Muscles and Tendons There is diffuse fatty atrophy of the muscles surrounding the forefoot. The flexor extensor tendons appear to be grossly intact. The plantar fascia appears to be intact. Soft tissues Overlying the plantar the near the first metatarsal amputation site there is a focal area of superficial ulceration with diffuse skin thickening and subcutaneous edema. No definite loculated fluid collection or subcutaneous emphysema is seen. IMPRESSION: Superficial ulceration along the plantar base beneath the first metatarsal amputation site with cellulitis and postsurgical changes. No definite loculated fluid collection or subcutaneous emphysema. Status post transmetatarsal amputation of the first through fifth digits without definite evidence of osteomyelitis. Electronically Signed   By: Prudencio Pair M.D.   On: 04/29/2020 03:30   DG Chest Port 1 View  Result Date: 04/28/2020 CLINICAL DATA:  Chest pain, weakness and dizziness. EXAM: PORTABLE CHEST 1 VIEW COMPARISON:  01/20/2020 FINDINGS: Mild patient rotation. Multi lead left-sided pacemaker. Cardiomegaly slightly increased from prior exam. Unchanged mediastinal contours. Aortic atherosclerosis. Mild vascular engorgement. No pulmonary edema. No focal airspace disease. No large pleural effusion. No pneumothorax. The bones appear under mineralized. Spinal stimulator  in place. Multiple overlying monitoring devices. No acute osseous abnormalities are seen. IMPRESSION: Cardiomegaly with mild vascular congestion. Electronically Signed   By: Keith Rake M.D.   On: 04/28/2020 21:40   ECHOCARDIOGRAM COMPLETE  Result Date: 04/29/2020    ECHOCARDIOGRAM REPORT   Patient Name:   Tim [name] Date of Exam: 04/29/2020 Medical Rec #:  [id number]       Height:       72.0 in Accession  #:    [id number]      Weight:       320.0 lb Date of Birth:  25-Aug-1940       BSA:          2.602 m Patient Age:    70 years        BP:           127/96 mmHg Patient Gender: M               HR:           66 bpm. Exam Location:  Inpatient Procedure: 2D Echo and Intracardiac Opacification Agent Indications:    endocarditis  History:        Patient has prior history of Echocardiogram examinations, most                 recent 01/16/2020. CAD, Defibrillator, COPD,                 Signs/Symptoms:Fever; Risk Factors:Sleep Apnea.  Sonographer:    Johny Chess Referring Phys: Graylin Shiver [facility]  Sonographer Comments: Suboptimal parasternal window and patient is morbidly obese. Image acquisition challenging due to patient body habitus. IMPRESSIONS  1. Left ventricular ejection fraction, by estimation, is 55 to 60%. The left ventricle has normal function. The left ventricle has no regional wall motion abnormalities. Left ventricular diastolic parameters are consistent with Grade I diastolic dysfunction (impaired relaxation).  2. Right ventricular systolic function is normal. The right ventricular size is mildly enlarged. Tricuspid regurgitation signal is inadequate for assessing PA pressure.  3. Left atrial size was mildly dilated.  4. The mitral valve is degenerative. No evidence of mitral valve regurgitation. Mild mitral stenosis. The mean mitral valve gradient is 5.0 mmHg. Moderate mitral annular calcification. There is a 1.3 x 1 cm mobile vegetation attached to the anterior mitral leaflet.  5. The aortic valve was not well visualized. Aortic valve regurgitation is not visualized. Mild aortic valve stenosis. Aortic valve mean gradient measures 16.0 mmHg.  6. The inferior vena cava is normal in size with greater than 50% respiratory variability, suggesting right atrial pressure of 3 mmHg.  7. Technically difficult study with very poor windows. Possible mitral valve vegetation. The tricuspid valve, aortic valve, and  ICD were poorly visualized. Suggest TEE. FINDINGS  Left Ventricle: Left ventricular ejection fraction, by estimation, is 55 to 60%. The left ventricle has normal function. The left ventricle has no regional wall motion abnormalities. Definity contrast agent was given IV to delineate the left ventricular  endocardial borders. The left ventricular internal cavity size was normal in size. There is no left ventricular hypertrophy. Left ventricular diastolic parameters are consistent with Grade I diastolic dysfunction (impaired relaxation). Right Ventricle: The right ventricular size is mildly enlarged. No increase in right ventricular wall thickness. Right ventricular systolic function is normal. Tricuspid regurgitation signal is inadequate for assessing PA pressure. Left Atrium: Left atrial size was mildly dilated. Right Atrium: Right atrial size was normal in size. Pericardium: There is no  evidence of pericardial effusion. Mitral Valve: The mitral valve is degenerative in appearance. There is moderate thickening of the mitral valve leaflet(s). Moderate mitral annular calcification. No evidence of mitral valve regurgitation. Mild mitral valve stenosis. MV peak gradient, 10.1 mmHg. The mean mitral valve gradient is 5.0 mmHg. Tricuspid Valve: The tricuspid valve is not well visualized. Tricuspid valve regurgitation is not demonstrated. Aortic Valve: The aortic valve was not well visualized. Aortic valve regurgitation is not visualized. Mild aortic stenosis is present. Aortic valve mean gradient measures 16.0 mmHg. Aortic valve peak gradient measures 30.9 mmHg. Pulmonic Valve: The pulmonic valve was not well visualized. Pulmonic valve regurgitation is not visualized. Aorta: Aortic root could not be assessed. Venous: The inferior vena cava is normal in size with greater than 50% respiratory variability, suggesting right atrial pressure of 3 mmHg. IAS/Shunts: No atrial level shunt detected by color flow Doppler. Additional  Comments: A pacer wire is visualized in the right ventricle.   Diastology LV e' medial:    6.74 cm/s LV E/e' medial:  18.4 LV e' lateral:   8.05 cm/s LV E/e' lateral: 15.4  RIGHT VENTRICLE             IVC RV S prime:     15.60 cm/s  IVC diam: 1.70 cm TAPSE (M-mode): 3.0 cm LEFT ATRIUM             Index LA Vol (A2C):   78.1 ml 30.02 ml/m LA Vol (A4C):   90.2 ml 34.67 ml/m LA Biplane Vol: 91.5 ml 35.17 ml/m  AORTIC VALVE AV Vmax:           278.00 cm/s AV Vmean:          182.000 cm/s AV VTI:            0.576 m AV Peak Grad:      30.9 mmHg AV Mean Grad:      16.0 mmHg LVOT Vmax:         119.00 cm/s LVOT Vmean:        81.800 cm/s LVOT VTI:          0.236 m LVOT/AV VTI ratio: 0.41 MITRAL VALVE MV Area (PHT): 2.05 cm     SHUNTS MV Peak grad:  10.1 mmHg    Systemic VTI: 0.24 m MV Mean grad:  5.0 mmHg MV Vmax:       1.59 m/s MV Vmean:      100.0 cm/s MV Decel Time: 370 msec MV E velocity: 124.00 cm/s MV A velocity: 132.00 cm/s MV E/A ratio:  0.94 Loralie Champagne MD Electronically signed by Loralie Champagne MD Signature Date/Time: 04/29/2020/6:03:22 PM    Final    [name] EKG SITE RITE  Result Date: 04/30/2020 If Site Rite image not attached, placement could not be confirmed due to current cardiac rhythm.   Microbiology: Recent Results (from the past 240 hour(s))  Respiratory Panel by RT PCR (Flu A&B, Covid) - Nasopharyngeal Swab     Status: None   Collection Time: 04/28/20 10:43 PM   Specimen: Nasopharyngeal Swab  Result Value Ref Range Status   SARS Coronavirus 2 by RT PCR NEGATIVE NEGATIVE Final    Comment: (NOTE) SARS-CoV-2 target nucleic acids are NOT DETECTED.  The SARS-CoV-2 RNA is generally detectable in upper respiratoy specimens during the acute phase of infection. The lowest concentration of SARS-CoV-2 viral copies this assay can detect is 131 copies/mL. A negative result does not preclude SARS-Cov-2 infection and should not be used as the sole  basis for treatment or other patient management  decisions. A negative result may occur with  improper specimen collection/handling, submission of specimen other than nasopharyngeal swab, presence of viral mutation(s) within the areas targeted by this assay, and inadequate number of viral copies (<131 copies/mL). A negative result must be combined with clinical observations, patient history, and epidemiological information. The expected result is Negative.  Fact Sheet for Patients:  PinkCheek.be  Fact Sheet for Healthcare Providers:  GravelBags.it  This test is no t yet approved or cleared by the [place] FDA and  has been authorized for detection and/or diagnosis of SARS-CoV-2 by FDA under an Emergency Use Authorization (EUA). This EUA will remain  in effect (meaning this test can be used) for the duration of the COVID-19 declaration under Section 564(b)(1) of the Act, 21 U.S.C. section 360bbb-3(b)(1), unless the authorization is terminated or revoked sooner.     Influenza A by PCR NEGATIVE NEGATIVE Final   Influenza B by PCR NEGATIVE NEGATIVE Final    Comment: (NOTE) The Xpert Xpress SARS-CoV-2/FLU/RSV assay is intended as an aid in  the diagnosis of influenza from Nasopharyngeal swab specimens and  should not be used as a sole basis for treatment. Nasal washings and  aspirates are unacceptable for Xpert Xpress SARS-CoV-2/FLU/RSV  testing.  Fact Sheet for Patients: PinkCheek.be  Fact Sheet for Healthcare Providers: GravelBags.it  This test is not yet approved or cleared by the [place] FDA and  has been authorized for detection and/or diagnosis of SARS-CoV-2 by  FDA under an Emergency Use Authorization (EUA). This EUA will remain  in effect (meaning this test can be used) for the duration of the  Covid-19 declaration under Section 564(b)(1) of the Act, 21  U.S.C. section 360bbb-3(b)(1), unless the  authorization is  terminated or revoked. Performed at [facility], [place] [address] [place], [place] [address]   Blood culture (routine x 2)     Status: None   Collection Time: 04/28/20 11:38 PM   Specimen: BLOOD RIGHT FOREARM  Result Value Ref Range Status   Specimen Description BLOOD RIGHT FOREARM  Final   Special Requests   Final    BOTTLES DRAWN AEROBIC AND ANAEROBIC Blood Culture results may not be optimal due to an inadequate volume of blood received in culture bottles   Culture   Final    NO GROWTH 5 DAYS Performed at [facility], [place] [address], [place], [place] [address]    Report Status 05/04/2020 FINAL  Final  Blood culture (routine x 2)     Status: None   Collection Time: 04/28/20 11:50 PM   Specimen: BLOOD LEFT HAND  Result Value Ref Range Status   Specimen Description BLOOD LEFT HAND  Final   Special Requests   Final    BOTTLES DRAWN AEROBIC AND ANAEROBIC Blood Culture results may not be optimal due to an inadequate volume of blood received in culture bottles   Culture   Final    NO GROWTH 5 DAYS Performed at [facility], [place] [address], [place], [place] [address]    Report Status 05/04/2020 FINAL  Final     Labs: Basic Metabolic Panel: Recent Labs  Lab 05/03/20 0350 05/04/20 0357 05/05/20 0418 05/06/20 0421 05/07/20 0501  NA 141 141 142 141 142  K 3.8 3.2* 3.7 3.4* 3.5  CL 105 103 104 103 102  CO2 27 28 28 29 30   GLUCOSE 135* 115* 122* 133* 106*  BUN 12 11  10 13 13   CREATININE 0.90 0.97 1.05 1.05 1.11  CALCIUM 8.7* 8.2* 8.7* 8.7* 8.8*  MG 2.0 1.6* 1.8 1.6* 1.7   Liver Function Tests: Recent Labs  Lab 05/03/20 0350 05/04/20 0357 05/05/20 0418 05/06/20 0421 05/07/20 0501  AST 30 26 23 20 21   ALT 25 23 22 20 19   ALKPHOS 52 47 47 48 47  BILITOT 0.6 0.7 0.5 0.6 0.4  PROT 6.3* 5.6* 6.2* 5.8* 5.9*  ALBUMIN 3.1* 2.8* 3.0* 2.9* 3.0*   No results for input(s): LIPASE, AMYLASE in the last 168 hours. No results for input(s):  AMMONIA in the last 168 hours. CBC: Recent Labs  Lab 05/03/20 0350 05/04/20 0357 05/05/20 0418 05/06/20 0421 05/07/20 0501  WBC 6.3 5.0 6.4 5.6 5.5  NEUTROABS 3.6 2.8 3.8 3.2 3.0  HGB 10.7* 10.0* 10.7* 10.1* 10.2*  HCT 33.3* 30.8* 32.7* 31.0* 32.1*  MCV 91.7 91.7 91.3 90.9 90.7  PLT 130* 120* 136* 129* 133*   Cardiac Enzymes: No results for input(s): CKTOTAL, CKMB, CKMBINDEX, TROPONINI in the last 168 hours. BNP: BNP (last 3 results) Recent Labs    05/05/20 0419 05/06/20 0421 05/07/20 0501  BNP 87.1 50.1 41.9    ProBNP (last 3 results) No results for input(s): PROBNP in the last 8760 hours.  CBG: Recent Labs  Lab 05/06/20 1159 05/06/20 1639 05/06/20 2054 05/07/20 0619 05/07/20 1109  GLUCAP 101* 134* 119* 111* 112*      Time spent: 35 minutes  Signed:  Para Skeans MD.  [facility] 05/07/2020, 1:40 PM

## 2020-05-07 NOTE — Consult Note (Signed)
[place] Nurse wound follow up Patient receiving care in [name]. Per information received from [employer] with Case Manager, Marvetta Gibbons, the patient is scheduled for discharge today.  I have placed an order for the bedside nurse to remove the profore wraps and discussed with the patient that he can return to using his home device, CircAid, as he was using prior to this hospitalization. Val Riles, RN, MSN, CWOCN, CNS-BC, pager [phone]

## 2020-05-07 NOTE — Care Management Important Message (Signed)
Important Message  Patient Details  Name: ORAL REMACHE MRN: [id number] Date of Birth: February 23, 1941   Medicare Important Message Given:  Yes     Shelda Altes 05/07/2020, 10:00 AM

## 2020-05-10 ENCOUNTER — Encounter: Payer: Self-pay | Admitting: Infectious Disease

## 2020-05-24 ENCOUNTER — Ambulatory Visit (INDEPENDENT_AMBULATORY_CARE_PROVIDER_SITE_OTHER): Payer: Medicare Other | Admitting: Infectious Disease

## 2020-05-24 ENCOUNTER — Telehealth: Payer: Self-pay

## 2020-05-24 ENCOUNTER — Other Ambulatory Visit: Payer: Self-pay

## 2020-05-24 VITALS — BP 125/69 | HR 79

## 2020-05-24 DIAGNOSIS — I059 Rheumatic mitral valve disease, unspecified: Secondary | ICD-10-CM

## 2020-05-24 DIAGNOSIS — T826XXD Infection and inflammatory reaction due to cardiac valve prosthesis, subsequent encounter: Secondary | ICD-10-CM

## 2020-05-24 DIAGNOSIS — T827XXD Infection and inflammatory reaction due to other cardiac and vascular devices, implants and grafts, subsequent encounter: Secondary | ICD-10-CM | POA: Diagnosis not present

## 2020-05-24 DIAGNOSIS — I38 Endocarditis, valve unspecified: Secondary | ICD-10-CM

## 2020-05-24 DIAGNOSIS — M86272 Subacute osteomyelitis, left ankle and foot: Secondary | ICD-10-CM

## 2020-05-24 DIAGNOSIS — I330 Acute and subacute infective endocarditis: Secondary | ICD-10-CM

## 2020-05-24 MED ORDER — AMOXICILLIN 500 MG PO TABS: 1000.0000 mg | ORAL_TABLET | Freq: Two times a day (BID) | ORAL | 11 refills | Status: DC

## 2020-05-24 NOTE — Progress Notes (Signed)
Subjective:    Patient ID: Tim [name], male    DOB: [birth year], 79 y.o.   MRN: [id number] Chief complaint: Concerned about infection recurring yet again in the future. HPI  79 y.o. male with medical history listed below is well known to the ID service and presented to the ER in November after starting to have fever, chills, and weakness.   Tim [name] initially presented with AMP sensitive enterococcal UTI complicated by enterococcal bacteremia in March 2021 in the setting of AICD and TAVR. TTE and TEE at the time were without evidence of vegetation and was prescribed 2 weeks of ampicillin. On follow up on 4/21 there was worsening of his amputation site with erythema and drainage. He was placed on doxycycline by Dr. Sharol Given which was then changed to Zyvox by Dr. Tommy Medal. This resolved his symptoms. On follow up with Dr. Tommy Medal on 5/26 he was experiencing fevers and dizziness with concern for systemic infection. Blood cultures in the ID clinic were positive for Enterococcus faecalis bacteremia. Surgical site was well healed at that time. 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 Recommended treatment was 6 weeks of ampicillin and ceftriaxone through 6/21. Feeling better on 6/21 and transitioned to life-long suppression with amoxicillin three times daily.   Tim [name] was then seen on 7/15 seen by Dr. Johnnye Sima with concern for cellulitis and was continued on his Amoxicillin. He followed up with Dr. Johnnye Sima on 8/6 and continued to do well with Amoxicillin. Last seen by Dr. Tommy Medal on 9/13 and was continued on Amoxicillin and amputation site was followed by Dr. Sharol Given.   Blood cultures no growth but MV had vegetation. TEE was not successful.  He is currently being treated again with ampicillin and high-dose ceftriaxone.  Plan is for him to complete 6 weeks followed by chronic higher dose amoxicillin of 1000 mg twice daily.   He is nearing the completion of his IV antibiotic  therapy.  He has had worsening of his amputation site after leaving the hospital and has follow-up with Dr. Sharol Given.    His wife and here worried about the possibility of this infection involving his native and prosthetic valves and pacemaker recurring or worsening yet again despite religiously taking of antibiotics.  Past Medical History:  Diagnosis Date  . AICD (automatic cardioverter/defibrillator) present   . ASTHMA   . Asthma    as a child  . Balanitis    +severe phimosis+ buried penis->circ not possible but dorsal slit done 10/2019  . BPH (benign prostatic hypertrophy)    with urinary retention.  Renal u/s 12/04/19 NORMAL KIDNEYS, NORMAL BLADDER, NO HYDRONEPHROSIS  . CAD (coronary artery disease)    Nonobstructive by 12/2012 cath; then 03/2016 he required BMS to RCA ([facility]).  In-stent restenosis on cath 11/01/16, baloon angioplasty successful--pt to take plavix (no ASA), + eliquis (for PAF).  [name] CATARACT, HX OF   . Chronic combined systolic and diastolic CHF (congestive heart failure) ([place]) 05/2016   Ischemic CM.  04/2018 EF 40-45%, grd I DD.  [name] Chronic pain syndrome    Lumbar DDD; chronic neuropathic pain (DM); has spinal stimulator and sees pain mgmt MD  . Complicated UTI (urinary tract infection) 09/2019   Phimoses, acute urinary retention, entoerococcus UTI, enterococc bacteremia.  F/u blood clx's neg x 5d.    [name] COPD   . Diabetes mellitus type 2 with complications (HCC)    QQI2L jumped from 5.7% to 6.1% 03/2015---started metformin at that time.  DM 2 dx by fasting gluc criteria 2018.  Has chronic neuropath pain  . Elevated transaminase level 02/2016   Suspect due to fatty liver (documented on u/s 2007). Plan repeat labs 03/2016.  [name] Enterococcal bacteremia 09/2019   Phimoses, acute urinary retention, entoerococcus UTI, enterococc bacteremia.  F/u blood clx's neg x 5d.    . Essential hypertension, benign   . Fatty liver 2007   2007 u/s showed fatty liver with hepatosplenomegaly.   2019 repeat u/s->fatty liver but no cirrhosis or hepatosplenomegaly.  . FUO (fever of unknown origin) 11/26/2019  . GERD (gastroesophageal reflux disease)   . Glaucoma   . GOUT   . Hallux valgus (acquired)   . HH (hiatus hernia)   . HYPERCHOLESTEROLEMIA-PURE   . Hypogonadism male   . ICD (implantable cardioverter-defibrillator) infection ([place]) 12/22/2019  . 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 Tommy Medal)  . Lumbosacral neuritis   . Lumbosacral spondylosis    Lumbar spinal stenosis with neurogenic claudication--contributes to his chronic pain syndrome  . Normal memory function 08/2014   Neuropsychological testing ([facility]): no cognitive impairment or sign of neurodegenerative disorder.  Likely has adjustment d/o with mixed anxiety/depressed features and may benefit from low dose SNRI.    [name] Normocytic anemia 03/2016   Mild-pt needs ferritin and vit B12 level checked (as of 03/22/16). Hb stable 09/2019  . NSTEMI (non-ST elevated myocardial infarction) ([place]) 03/20/2016   BMS to RCA  . Obesity hypoventilation syndrome ([place])   . Obesity, unspecified   . Orthostatic hypotension   . OSA on CPAP    8 cm H2O  . OSTEOARTHRITIS   . Paroxysmal atrial fibrillation ([place]) 2003    (? chronic?) Off anticoag for a while due to falls.  Then apixaban started 12/2014.  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 Ella Bodo): neuropathic pain->diff to treat, failed nucynta, failed spinal stimul trial, oxycontin hs + tramadol + gabap as of 12/2017 f/u Dr. Letta Pate.  . Personal history of colonic adenoma 10/30/2012   Diminutive adenoma, consider repeat 2019 per GI  . PFO (patent foramen ovale) 09/2019   small, with predominately L to R shunt  . Pneumonia   . Presence of cardiac defibrillator 11/07/2017  . Primary osteoarthritis of both knees    Bone on bone of  medial compartments, + signif patellofemoral arth bilat.--supartz inj series started 09/12/17  . 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 PUD (peptic ulcer disease)   . PULMONARY HYPERTENSION, HX OF   . Secondary male hypogonadism 2017  . Sepsis ([place]) 04/29/2020  . 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 Shortness of breath    with exertion: much improved s/p TAVR and treatment for CHF.  . Sick sinus syndrome (HCC)    PPM placed  . Thrombocytopenia ([place]) 2018   HSM on 2007 abd u/s---suspect some mild splenic sequestration chronically.  [name] Unspecified glaucoma(365.9)   . Unspecified hereditary and idiopathic peripheral neuropathy approx age 74   bilat LE's, ? left arm, too.  Feet became progressively numb + left foot pain intermittently.  Pt may be trying a spinal stimulator (as of 05/2015)  . VENOUS INSUFFICIENCY    Being followed by Dr. Sharol Given as of 10/2016 for two R LL venous stasis ulcers/skin tears.  Healed as of 10/30/16 f/u with  Dr. Sharol Given.  . VENTRAL HERNIA   . Wound dehiscence 10/22/2019    Past Surgical History:  Procedure Laterality Date  . AMPUTATION Left 04/11/2013   Procedure: AMPUTATION DIGIT Left 3rd toe;  Surgeon: [name], MD;  Location: [place];  Service: Orthopedics;  Laterality: Left;  Left 3rd toe amputation at [facility]  . AMPUTATION Left 08/29/2019   Procedure: LEFT TRANSMETATARSAL AMPUTATION;  Surgeon: [name], MD;  Location: [place];  Service: Orthopedics;  Laterality: Left;  . BIOPSY  05/04/2020   Procedure: BIOPSY;  Surgeon: Jackquline Denmark, MD;  Location: [facility] ENDOSCOPY;  Service: Endoscopy;;  . [place]; 03/10/16   1997 Non-obstructive disease.  03/2016 BMS to RCA, with 25% pDiag dz, o/w normal cors per cath 03/07/16.  Cath 11/01/16: in stent restenosis, successful baloon angioplasty.  [name] CARDIAC CATHETERIZATION  12/24/2012   mild < 20% LCx, prox 30% RCA; LVEF 55-65% , moderate  pulmonary HTN, moderate AS  . CARDIAC DEFIBRILLATOR PLACEMENT  11/07/2017   [facility] MRI Quad CRT defibrillator  . CARDIOVASCULAR STRESS TEST  05/11/16 ([facility])   Myocardial perfusion imaging:  No ischemia; scar in apex, global hypokinesis, EF 36%.  . Carotid dopplers  03/09/2016   [facility]: no hemodynamically significant stenosis on either side.  . CHOLECYSTECTOMY    . COLONOSCOPY [name] 10/30/2012   Procedure: COLONOSCOPY;  Surgeon: [name], MD;  Location: [facility] ENDOSCOPY;  Service: Endoscopy;  Laterality: [name];  . CORONARY ANGIOPLASTY WITH STENT PLACEMENT  03/2016; 04/2017   2017-[facility]: BMS to RCA-pt was placed on Brilinta.  04/2017: DES to RCA.  . DORSAL SLIT [name] 10/29/2019   for severe phimosis. Procedure: DORSAL SLIT;  Surgeon: Alexis Frock, MD;  Location: [facility] ORS;  Service: Urology;  Laterality: [name];  57 MINS  . ESOPHAGEAL DILATION  05/04/2020   Procedure: ESOPHAGEAL DILATION;  Surgeon: Jackquline Denmark, MD;  Location: [facility] ENDOSCOPY;  Service: Endoscopy;;  . ESOPHAGOGASTRODUODENOSCOPY (EGD) WITH PROPOFOL [name] 05/04/2020   Procedure: ESOPHAGOGASTRODUODENOSCOPY (EGD) WITH PROPOFOL;  Surgeon: Jackquline Denmark, MD;  Location: [facility] ENDOSCOPY;  Service: Endoscopy;  Laterality: [name];  . EYE SURGERY Bilateral cataract  . HEMORRHOID SURGERY    . INTRAOCULAR LENS INSERTION Bilateral   . KNEE SURGERY Right   . LEFT AND RIGHT HEART CATHETERIZATION WITH CORONARY ANGIOGRAM [name] 12/24/2012   Procedure: LEFT AND RIGHT HEART CATHETERIZATION WITH CORONARY ANGIOGRAM;  Surgeon: [name], MD;  Location[facility] CATH LAB;  Service: Cardiovascular;  Laterality: [name];  . LEG SURGERY Bilateral    lenghtening   . PACEMAKER PLACEMENT  04/13/2016   2nd deg HB after TAVR, pt had DC MDT PPM placed.  [name] SHOULDER ARTHROSCOPY  08/30/2011   Procedure: ARTHROSCOPY SHOULDER;  Surgeon: [name], MD;  Location: [place];  Service: Orthopedics;  Laterality: Right;  Right Shoulder Arthroscopy, Debridement, and Decompression  . SPINAL  CORD STIMULATOR INSERTION [name] 09/10/2015   Procedure: LUMBAR SPINAL CORD STIMULATOR INSERTION;  Surgeon: [name], MD;  Location: [place] NEURO ORS;  Service: Neurosurgery;  Laterality: [name];  . TEE WITHOUT CARDIOVERSION [name] 09/16/2019   Procedure: TRANSESOPHAGEAL ECHOCARDIOGRAM (TEE);  Surgeon: Skeet Latch, MD;  Location: [place];  Service: Cardiovascular;  Laterality: [name];  . TEE WITHOUT CARDIOVERSION [name] 12/02/2019   +vegetation on AVR and pacer lead in RV.  EF 55-60%, normal wall motion.  Valves function normal.  Procedure: TRANSESOPHAGEAL ECHOCARDIOGRAM (TEE);  Surgeon: Geralynn Rile, MD;  Location: [place];  Service: Cardiovascular;  Laterality: [name];  . TOE AMPUTATION  Left    due to osteomyelitis.  R big toe surg due to osteoarth  . TONSILLECTOMY    . traeculectomy Left    eye  . TRANSCATHETER AORTIC VALVE REPLACEMENT, TRANSFEMORAL  04/11/2016  . TRANSESOPHAGEAL ECHOCARDIOGRAM  03/09/2016; 09/2019   [facility]: EF 55-60%, PFO seen with bi-directional shunting, no thrombus in appendage.  09/2019 ->no valvular vegetations. Small patent foramen ovale with predominantly left to right shunting across the interatrial septum.  . TRANSTHORACIC ECHOCARDIOGRAM  01/2015; 01/2016; 05/18/16; 09/18/16, 05/2017, 08/2017   01/2015 No signif change in aortic stenosis (moderate).  01/2016 Severe LVH w/small LV cavity, EF 60-65%, grade I diast dysfxn.  05/2016 (s/p TAVR): EF 50-55%, grd I DD, biopros AV good.  08/2016--EF 50-55%, LV septal motion c/w conduction abnl, grd I DD,mild MS,bioprosth aortic valve well seated, w/trace AR. 05/2017 TTE EF 35%. 08/2017-EF 35%, mod diff hypokin LV, grd I DD, biopros AV good.   . TRANSTHORACIC ECHOCARDIOGRAM  04/2018; 09/2019   04/2018: EF 40-45%, mod diffuse LV hypokinesis, grd I DD, bioprosth AV well seated, no AS or AR. 09/2019 EF 60-65%, grd I DD, valves fine, including bioprosth AV  . VITRECTOMY      Family History  Problem Relation Age of Onset  . Hypertension  Mother   . Coronary artery disease Mother   . Heart attack Mother   . Neuropathy Mother   . Pulmonary fibrosis Father        asbestosis      Social History   Socioeconomic History  . Marital status: Married    Spouse name: Not on file  . Number of children: 0  . Years of education: 8  . Highest education level: Not on file  Occupational History  . Occupation: Engineer, production    Comment: retired  Tobacco Use  . Smoking status: Never Smoker  . Smokeless tobacco: Never Used  Vaping Use  . Vaping Use: Never used  Substance and Sexual Activity  . Alcohol use: No    Alcohol/week: 0.0 standard drinks  . Drug use: No    Types: Oxycodone  . Sexual activity: Not Currently  Other Topics Concern  . Not on file  Social History Narrative   HSG, [name] - BS, [name] - MS-engineering, 2 years on PhD - [place]. Married - '65 - 16yr/divorced; '76- 3 yrs/divorced; '92 . No children. 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 ACP/Living Will - Yes CPR; long-term Mechanical ventilation as long as he was able to cognate; ok for long term artificial nutrition. Precondition being able to cognate and not to have too much pain.    Social Determinants of Health   Financial Resource Strain:   . Difficulty of Paying Living Expenses: Not on file  Food Insecurity:   . Worried About RCharity fundraiserin the Last Year: Not on file  . Ran Out of Food in the Last Year: Not on file  Transportation Needs:   . Lack of Transportation (Medical): Not on file  . Lack of Transportation (Non-Medical): Not on file  Physical Activity:   . Days of Exercise per Week: Not on file  . Minutes of Exercise per Session: Not on file  Stress:   . Feeling of Stress : Not on file  Social Connections:   . Frequency of Communication with Friends and Family: Not on file  . Frequency of Social Gatherings with Friends and Family: Not on file  . Attends  Religious Services: Not  on file  . Active Member of Clubs or Organizations: Not on file  . Attends Archivist Meetings: Not on file  . Marital Status: Not on file    Allergies  Allergen Reactions  . Brimonidine Tartrate Shortness Of Breath    Alphagan-Shortness of breath  . Brinzolamide Shortness Of Breath    AZOPT- Shortness of breath  . Latanoprost Shortness Of Breath    XALATAN- Shortness of breath  . Nucynta [Tapentadol] Shortness Of Breath  . Sulfa Antibiotics Palpitations  . Timolol Maleate Shortness Of Breath and Other (See Comments)    TIMOPTIC- Aggravated asthma  . Diltiazem Swelling     leg swelling  . Rofecoxib Swelling     VIOXX- leg swelling  . Vancomycin Hives and Other (See Comments)    Possible "Red Man Syndrome"? > hives/blisters  . Codeine Other (See Comments)    Childhood reaction  . Tamsulosin Other (See Comments)    Dizziness   . Celecoxib Other (See Comments)    CELLBREX-confusion  . Colchicine Diarrhea    diarrhea  . Tape Rash     Current Outpatient Medications:  .  albuterol (PROVENTIL HFA;VENTOLIN HFA) 108 (90 BASE) MCG/ACT inhaler, Inhale 2 puffs into the lungs 4 (four) times daily as needed for wheezing or shortness of breath., Disp: , Rfl:  .  allopurinol (ZYLOPRIM) 300 MG tablet, TAKE 1 TABLET BY MOUTH ONCE DAILY WITH FOOD, Disp: 90 tablet, Rfl: 1 .  ampicillin IVPB, Inject 12 g into the vein daily. As a continuous infusion. Indication:  Enterococcal endocarditis  First Dose: yes Last Day of Therapy:  06/08/20 Labs - Once weekly:  CBC/D and BMP, Labs - Every other week:  ESR and CRP Method of administration: Ambulatory Pump (Continuous Infusion) Method of administration may be changed at the discretion of home infusion pharmacist based upon assessment of the patient and/or caregiver's ability to self-administer the medication ordered., Disp: 32 Units, Rfl: 0 .  apixaban (ELIQUIS) 5 MG TABS tablet, Take 5 mg by mouth 2 (two) times daily., Disp: , Rfl:  .  ASPIRIN  LOW DOSE 81 MG EC tablet, Take 81 mg by mouth daily., Disp: , Rfl:  .  B Complex-C (B-COMPLEX WITH VITAMIN C) tablet, Take 1 tablet by mouth daily. Take 1 tablet daily, 291m, Disp: , Rfl:  .  cefTRIAXone (ROCEPHIN) IVPB, Inject 2 g into the vein every 12 (twelve) hours. Indication: Enterococcal endocarditis First Dose: Yes Last Day of Therapy:  06/08/20 Labs - Once weekly:  CBC/D and BMP, Labs - Every other week:  ESR and CRP Method of administration: IV Push Method of administration may be changed at the discretion of home infusion pharmacist based upon assessment of the patient and/or caregiver's ability to self-administer the medication ordered., Disp: 62 Units, Rfl: 0 .  ezetimibe (ZETIA) 10 MG tablet, Take 1 tablet (10 mg total) by mouth daily., Disp: 30 tablet, Rfl: 1 .  finasteride (PROSCAR) 5 MG tablet, Take 1 tablet (5 mg total) by mouth daily., Disp: 90 tablet, Rfl: 0 .  fluticasone (FLONASE) 50 MCG/ACT nasal spray, Place 2 sprays into both nostrils as needed for allergies or rhinitis., Disp: , Rfl:  .  gabapentin (NEURONTIN) 800 MG tablet, TAKE 1 TABLET (800 MG TOTAL) BY MOUTH 4 (FOUR) TIMES DAILY., Disp: 360 tablet, Rfl: 2 .  KLOR-CON M20 20 MEQ tablet, Take 20 mEq by mouth daily., Disp: , Rfl:  .  magnesium oxide (MAG-OX) 400 (241.3 Mg) MG  tablet, Take 1 tablet (400 mg total) by mouth daily., Disp: 30 tablet, Rfl: 0 .  metoprolol succinate (TOPROL-XL) 25 MG 24 hr tablet, Take 25 mg by mouth daily., Disp: , Rfl:  .  mometasone-formoterol (DULERA) 100-5 MCG/ACT AERO, Inhale 2 puffs into the lungs 2 (two) times daily for 60 doses., Disp: 1 each, Rfl: 0 .  Multiple Vitamin (MULTIVITAMIN ADULT PO), Take by mouth daily., Disp: , Rfl:  .  niacin (NIASPAN) 1000 MG CR tablet, TAKE 1 TABLET BY MOUTH TWICE A DAY, Disp: 180 tablet, Rfl: 0 .  nitroGLYCERIN (NITROSTAT) 0.4 MG SL tablet, Place 0.4 mg under the tongue every 5 (five) minutes as needed for chest pain. , Disp: , Rfl:  .  pantoprazole  (PROTONIX) 40 MG tablet, TAKE 1 TABLET BY MOUTH EVERY DAY, Disp: 90 tablet, Rfl: 3 .  rosuvastatin (CRESTOR) 40 MG tablet, take 1 tablet by mouth once daily (Patient taking differently: Take 40 mg by mouth daily. ), Disp: 15 tablet, Rfl: 0 .  sacubitril-valsartan (ENTRESTO) 24-26 MG, Take 1 tablet by mouth 2 (two) times daily. , Disp: , Rfl:  .  spironolactone (ALDACTONE) 25 MG tablet, Take 0.5 tablets (12.5 mg total) by mouth daily., Disp: 15 tablet, Rfl: 0 .  torsemide (DEMADEX) 20 MG tablet, Take 1 tablet (20 mg total) by mouth in the morning and at bedtime., Disp: 60 tablet, Rfl: 0 .  vitamin C (ASCORBIC ACID) 250 MG tablet, Take 250 mg by mouth daily., Disp: , Rfl:  .  VITAMIN D, CHOLECALCIFEROL, PO, Take 1 tablet by mouth daily. , Disp: , Rfl:  .  WIXELA INHUB 250-50 MCG/DOSE AEPB, TAKE 1 PUFF BY MOUTH TWICE A DAY, Disp: 60 each, Rfl: 11   Review of Systems  Constitutional: Negative for activity change, appetite change, chills, diaphoresis, fatigue, fever and unexpected weight change.  HENT: Negative for congestion, rhinorrhea, sinus pressure, sneezing, sore throat and trouble swallowing.   Eyes: Negative for photophobia and visual disturbance.  Respiratory: Negative for cough, chest tightness, shortness of breath, wheezing and stridor.   Cardiovascular: Negative for chest pain, palpitations and leg swelling.  Gastrointestinal: Negative for abdominal distention, abdominal pain, anal bleeding, blood in stool, constipation, diarrhea, nausea and vomiting.  Genitourinary: Negative for difficulty urinating, dysuria, flank pain and hematuria.  Musculoskeletal: Negative for arthralgias, back pain, gait problem, joint swelling and myalgias.  Skin: Positive for wound. Negative for color change, pallor and rash.  Neurological: Negative for dizziness, tremors, weakness and light-headedness.  Hematological: Negative for adenopathy. Does not bruise/bleed easily.  Psychiatric/Behavioral: Negative for  agitation, behavioral problems, confusion, decreased concentration, dysphoric mood and sleep disturbance. The patient is nervous/anxious.        Objective:   Physical Exam Constitutional:      General: He is not in acute distress.    Appearance: Normal appearance. He is well-developed. He is not ill-appearing or diaphoretic.  HENT:     Head: Normocephalic and atraumatic.     Right Ear: Hearing and external ear normal.     Left Ear: Hearing and external ear normal.     Nose: No nasal deformity or rhinorrhea.  Eyes:     General: No scleral icterus.    Conjunctiva/sclera: Conjunctivae normal.     Right eye: Right conjunctiva is not injected.     Left eye: Left conjunctiva is not injected.     Pupils: Pupils are equal, round, and reactive to light.  Neck:     Vascular: No JVD.  Cardiovascular:  Rate and Rhythm: Normal rate and regular rhythm.     Heart sounds: S1 normal and S2 normal.  Pulmonary:     Effort: Pulmonary effort is normal. No respiratory distress.  Abdominal:     General: There is no distension.     Palpations: Abdomen is soft.  Musculoskeletal:        General: Normal range of motion.     Right shoulder: Normal.     Left shoulder: Normal.     Cervical back: Normal range of motion and neck supple.     Right hip: Normal.     Left hip: Normal.     Right knee: Normal.     Left knee: Normal.  Lymphadenopathy:     Head:     Right side of head: No submandibular, preauricular or posterior auricular adenopathy.     Left side of head: No submandibular, preauricular or posterior auricular adenopathy.     Cervical: No cervical adenopathy.     Right cervical: No superficial or deep cervical adenopathy.    Left cervical: No superficial or deep cervical adenopathy.  Skin:    General: Skin is warm and dry.     Coloration: Skin is not pale.     Findings: No abrasion, bruising, ecchymosis, erythema, lesion or rash.     Nails: There is no clubbing.  Neurological:      General: No focal deficit present.     Mental Status: He is alert and oriented to person, place, and time.     Sensory: No sensory deficit.     Coordination: Coordination normal.     Gait: Gait normal.  Psychiatric:        Attention and Perception: He is attentive.        Mood and Affect: Mood normal.        Speech: Speech normal.        Behavior: Behavior normal. Behavior is cooperative.        Thought Content: Thought content normal.        Judgment: Judgment normal.     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  Complete dual beta-lactam therapy with ceftriaxone and ampicillin and then placed on lifelong amoxicillin at a higher dose of 1000 mg twice daily.  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  We will follow up with Dr. Sharol Given I wonder if he will need more proximal amputation and if so we can consider giving him more protracted IV therapy.  We will await evaluation by Dr. Sharol Given

## 2020-05-24 NOTE — Telephone Encounter (Signed)
Called [facility] pharmacy to give verbal orders. Ok to pull PICC after completion of abx therapy on 12/7. Patient notified. Oral abx prescribed.  Damian Hofstra Lorita Officer, RN

## 2020-05-26 ENCOUNTER — Inpatient Hospital Stay: Payer: Medicare Other | Admitting: Family Medicine

## 2020-05-26 ENCOUNTER — Other Ambulatory Visit: Payer: Self-pay

## 2020-05-28 DIAGNOSIS — I330 Acute and subacute infective endocarditis: Secondary | ICD-10-CM | POA: Insufficient documentation

## 2020-06-02 ENCOUNTER — Inpatient Hospital Stay: Payer: Medicare Other | Admitting: Family Medicine

## 2020-06-03 ENCOUNTER — Encounter: Payer: Self-pay | Admitting: Orthopedic Surgery

## 2020-06-03 ENCOUNTER — Encounter: Payer: Self-pay | Admitting: Pulmonary Disease

## 2020-06-03 ENCOUNTER — Ambulatory Visit (INDEPENDENT_AMBULATORY_CARE_PROVIDER_SITE_OTHER): Payer: Medicare Other | Admitting: Orthopedic Surgery

## 2020-06-03 VITALS — Ht 72.0 in | Wt 318.0 lb

## 2020-06-03 DIAGNOSIS — Z89432 Acquired absence of left foot: Secondary | ICD-10-CM | POA: Diagnosis not present

## 2020-06-03 DIAGNOSIS — T8781 Dehiscence of amputation stump: Secondary | ICD-10-CM

## 2020-06-04 ENCOUNTER — Telehealth: Payer: Self-pay

## 2020-06-04 NOTE — Telephone Encounter (Signed)
Could you see if this patient needs cardiac clearance for bka, and lets post for next week

## 2020-06-04 NOTE — Telephone Encounter (Signed)
I called and sw pt. He states that he would like to proceed with BKA. Asked if he would need cardiac clearance. If so He sees Dr. Clovis Riley [place] [phone] he would like to set this up before the end of the year. Please advise if he needs clearance pt has a hx for endocarditis and exertional chest pain

## 2020-06-04 NOTE — Telephone Encounter (Signed)
Patient called he stated he has questions about surgery, recovery and home care he would like a call back [phone]

## 2020-06-07 ENCOUNTER — Telehealth: Payer: Self-pay | Admitting: Orthopedic Surgery

## 2020-06-07 ENCOUNTER — Encounter: Payer: Self-pay | Admitting: Orthopedic Surgery

## 2020-06-07 NOTE — Telephone Encounter (Signed)
Please advise 

## 2020-06-07 NOTE — Telephone Encounter (Signed)
Pt called to state that the earliest that his cardiologist can give him a stress test is January 6th.   He was wondering if there was any way Dr. Sharol Given could get him an earlier stress test.

## 2020-06-07 NOTE — Telephone Encounter (Signed)
I called.  Patient is going to have a stress test with his cardiologist to make sure he is safe for surgery.  Patient is concerned about going to a rehab facility with risks of Covid with his comorbidities.  I discussed that every patient that leaves the hospital to go to skilled nursing has to be Covid tested so I feel that the skilled nursing facilities are about as safe as they can be patient states he will be researching different skilled nursing facilities and will call if he has any further questions.

## 2020-06-07 NOTE — Telephone Encounter (Signed)
Could you please call patient? He is requesting to speak with you or Autumn only. Thanks.

## 2020-06-07 NOTE — Progress Notes (Signed)
Office Visit Note   Patient: Tim [name]           Date of Birth: January 31, 1941           MRN: [id number] Visit Date: 06/03/2020              Requested by: Tammi Sou, MD [address] [place],  [place] [address] PCP: Tammi Sou, MD  Chief Complaint  [name]    08/29/19 left transmet amputation       HPI: Patient is an 79 year old gentleman who is 9 months status post left transmetatarsal amputation.  Patient states he has significant neuropathic pain in his foot.  He states he was recently hospitalized with a heart valve infection that is currently being managed by infectious disease.  Patient states he had vascular studies at [facility].  Assessment & Plan: Visit Diagnoses:  1. History of transmetatarsal amputation of left foot ([place])   2. Dehiscence of amputation stump (HCC)     Plan: Discussed that with the nonhealing transmetatarsal amputation his best option would be to proceed with a transtibial amputation.  Discussed that I do not feel that he would be able to ambulate with a [place] type amputation due to his size.  Discussed patient may require cardiac clearance.  Discussed the patient would require discharge to skilled nursing and patient is concerned with going to skilled nursing with Covid.  He will go home and discuss this and call if he has any questions.  Follow-Up Instructions: Return if symptoms worsen or fail to improve, for We will follow-up 1 week after surgery.Manson Passey Exam  [name], oriented, no adenopathy, well-dressed, normal affect, normal respiratory effort. Examination patient has a good dorsalis pedis pulse his circulation is intact of the ankle but has significant microcirculatory dysfunction he has a chronic ulcer over the mid aspect of the transmetatarsal amputation.  After informed consent a 10 blade knife was used to debride the skin and soft tissue.  Predebridement the ulcer is 10 mm  in diameter after debridement the ulcer is 3 cm in diameter and 3 mm deep it does not probe to bone however reviewing the radiographs there are irregular changes of the bone at the transmet Tarsal amputation consistent with bone infection. Imaging: No results found. No images are attached to the encounter.  Labs: Lab Results  Component Value Date   HGBA1C 6.1 (H) 04/29/2020   HGBA1C 5.6 10/02/2019   HGBA1C 6.1 03/27/2019   ESRSEDRATE 20 02/09/2016   ESRSEDRATE 36 (H) 01/28/2014   CRP 0.7 05/03/2020   CRP 1.0 (H) 05/02/2020   CRP 1.9 (H) 05/01/2020   LABURIC 3.5 (L) 09/10/2013   REPTSTATUS 05/04/2020 FINAL 04/28/2020   CULT  04/28/2020    NO GROWTH 5 DAYS Performed at [facility], [place] [address], [place], [place] [address]    LABORGA ENTEROCOCCUS FAECALIS 09/11/2019     Lab Results  Component Value Date   ALBUMIN 3.0 (L) 05/07/2020   ALBUMIN 2.9 (L) 05/06/2020   ALBUMIN 3.0 (L) 05/05/2020   LABURIC 3.5 (L) 09/10/2013    Lab Results  Component Value Date   MG 1.7 05/07/2020   MG 1.6 (L) 05/06/2020   MG 1.8 05/05/2020   No results found for: VD25OH  No results found for: PREALBUMIN CBC EXTENDED Latest Ref Rng & Units 05/07/2020 05/06/2020 05/05/2020  WBC 4.0 - 10.5 K/uL 5.5 5.6 6.4  RBC 4.22 - 5.81 MIL/uL 3.54(L) 3.41(L) 3.58(L)  HGB 13.0 - 17.0 g/dL 10.2(L) 10.1(L) 10.7(L)  HCT 39 - 52 % 32.1(L) 31.0(L) 32.7(L)  PLT 150 - 400 K/uL 133(L) 129(L) 136(L)  NEUTROABS 1.7 - 7.7 K/uL 3.0 3.2 3.8  LYMPHSABS 0.7 - 4.0 K/uL 1.6 1.6 1.7     Body mass index is 43.13 kg/m.  Orders:  No orders of the defined types were placed in this encounter.  No orders of the defined types were placed in this encounter.    Procedures: No procedures performed  Clinical Data: No additional findings.  ROS:  All other systems negative, except as noted in the HPI. Review of Systems  Objective: Vital Signs: Ht 6' (1.829 m)   Wt (!) 318 lb (144.2 kg)   BMI 43.13 kg/m    Specialty Comments:  No specialty comments available.  PMFS History: Patient Active Problem List   Diagnosis Date Noted  . Non-pressure chronic ulcer of other part of left foot limited to breakdown of skin ([place])   . Esophageal dysphagia   . Bacteremia   . Endocarditis of mitral valve   . Fever 04/28/2020  . Cervical lymphadenopathy 03/15/2020  . 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 ICD (implantable cardioverter-defibrillator) infection ([place]) 12/22/2019  . Gram-positive bacteremia 11/27/2019  . FUO (fever of unknown origin) 11/26/2019  . Low blood pressure reading 11/05/2019  . Wound dehiscence 10/22/2019  . PFO (patent foramen ovale) 10/06/2019  . ICD (implantable cardioverter-defibrillator) battery depletion   . Pressure injury of skin 09/13/2019  . Enterococcal bacteremia history  09/13/2019  . 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  . 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 Urinary retention 09/13/2019  . Phimosis 09/13/2019  . SIRS (systemic inflammatory response syndrome) ([place]) 09/12/2019  . Sepsis ([place]) 09/12/2019  . Enterococcus UTI 09/12/2019  . Cellulitis of foot   . History of transmetatarsal amputation of left foot ([place])   . Subacute osteomyelitis, left ankle and foot ([place])   . 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 Diabetic neuropathy, painful ([place]) 12/26/2016  . Neuropathic pain of foot, left 06/20/2016  . 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 Status post amputation of left great toe ([place]) 06/08/2015  . Spinal stenosis of lumbar region 02/16/2015  . Hereditary and idiopathic peripheral neuropathy 01/12/2015  . Oral thrush 12/28/2014  . Orthostatic dizziness 05/24/2014  . Paresthesias with subjective weakness 01/30/2014  . Bilateral thigh pain 01/30/2014  . Abdominal wall pain in right upper quadrant 01/22/2014  . Gross  hematuria 09/25/2013  . Intertrigo 09/25/2013  . IBS (irritable bowel syndrome) 09/11/2013  . Chronic pain syndrome 08/06/2013  . CAD (coronary artery disease), native coronary artery 12/25/2012  . Paroxysmal atrial fibrillation ([place]) 12/25/2012  . Moderate aortic stenosis 12/25/2012  . Aortic stenosis 12/24/2012  . Personal history of colonic adenoma 10/30/2012  . Diverticulosis of colon (without mention of hemorrhage) 10/30/2012  . Internal hemorrhoids 10/30/2012  . Change in bowel habits intermittent loose stools 10/30/2012  . Routine health maintenance 06/09/2012  . Hereditary peripheral neuropathy 10/10/2011  . Long term current use of anticoagulant - apixiban 08/16/2010  . Dyspnea on exertion 09/08/2009  . HYPERCHOLESTEROLEMIA-PURE 01/13/2009  . Essential hypertension, benign 01/13/2009  . EDEMA 01/13/2009  . GOUT 01/12/2009  . GLAUCOMA 01/12/2009  . Venous insufficiency (chronic) (peripheral) 01/12/2009  . COPD (chronic obstructive pulmonary disease) ([place]) 01/12/2009  . VENTRAL HERNIA 01/12/2009  . OSTEOARTHRITIS  01/12/2009  . ARTHRITIS 01/12/2009  . DEGENERATIVE DISC DISEASE 01/12/2009  . CATARACT, HX OF 01/12/2009  . HEART MURMUR, HX OF 01/12/2009  . BENIGN PROSTATIC HYPERTROPHY, HX OF 01/12/2009  . Obesity 01/17/2007  . OBSTRUCTIVE SLEEP APNEA 01/17/2007  . ASTHMA 01/17/2007  . GERD 01/17/2007  . HALLUX VALGUS, ACQUIRED 01/17/2007  . Pulmonary hypertension ([place]) 01/17/2007   Past Medical History:  Diagnosis Date  . AICD (automatic cardioverter/defibrillator) present   . ASTHMA   . Asthma    as a child  . Balanitis    +severe phimosis+ buried penis->circ not possible but dorsal slit done 10/2019  . BPH (benign prostatic hypertrophy)    with urinary retention.  Renal u/s 12/04/19 NORMAL KIDNEYS, NORMAL BLADDER, NO HYDRONEPHROSIS  . CAD (coronary artery disease)    Nonobstructive by 12/2012 cath; then 03/2016 he required BMS to RCA ([facility]).  In-stent restenosis on  cath 11/01/16, baloon angioplasty successful--pt to take plavix (no ASA), + eliquis (for PAF).  [name] CATARACT, HX OF   . Chronic combined systolic and diastolic CHF (congestive heart failure) ([place]) 05/2016   Ischemic CM.  04/2018 EF 40-45%, grd I DD.  [name] Chronic pain syndrome    Lumbar DDD; chronic neuropathic pain (DM); has spinal stimulator and sees pain mgmt MD  . Complicated UTI (urinary tract infection) 09/2019   Phimoses, acute urinary retention, entoerococcus UTI, enterococc bacteremia.  F/u blood clx's neg x 5d.    [name] COPD   . Diabetes mellitus type 2 with complications (HCC)    URK2H jumped from 5.7% to 6.1% 03/2015---started metformin at that time.  DM 2 dx by fasting gluc criteria 2018.  Has chronic neuropath pain  . Elevated transaminase level 02/2016   Suspect due to fatty liver (documented on u/s 2007). Plan repeat labs 03/2016.  [name] Enterococcal bacteremia 09/2019   Phimoses, acute urinary retention, entoerococcus UTI, enterococc bacteremia.  F/u blood clx's neg x 5d.    . Essential hypertension, benign   . Fatty liver 2007   2007 u/s showed fatty liver with hepatosplenomegaly.  2019 repeat u/s->fatty liver but no cirrhosis or hepatosplenomegaly.  . FUO (fever of unknown origin) 11/26/2019  . GERD (gastroesophageal reflux disease)   . Glaucoma   . GOUT   . Hallux valgus (acquired)   . HH (hiatus hernia)   . HYPERCHOLESTEROLEMIA-PURE   . Hypogonadism male   . ICD (implantable cardioverter-defibrillator) infection ([place]) 12/22/2019  . 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 Tommy Medal)  . Lumbosacral neuritis   . Lumbosacral spondylosis    Lumbar spinal stenosis with neurogenic claudication--contributes to his chronic pain syndrome  . Normal memory function 08/2014   Neuropsychological testing ([facility]): no cognitive impairment or sign of neurodegenerative disorder.  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    [name] Normocytic anemia 03/2016   Mild-pt needs ferritin and vit B12 level checked (as of 03/22/16). Hb stable 09/2019  . NSTEMI (non-ST elevated myocardial infarction) ([place]) 03/20/2016   BMS to RCA  . Obesity hypoventilation syndrome ([place])   . Obesity, unspecified   . Orthostatic hypotension   . OSA on CPAP    8 cm H2O  . OSTEOARTHRITIS   . Paroxysmal atrial fibrillation ([place]) 2003    (? chronic?) Off anticoag for a while due to falls.  Then apixaban started 12/2014.  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 Ella Bodo): neuropathic pain->diff to treat, failed nucynta, failed spinal stimul trial, oxycontin hs + tramadol + gabap as of 12/2017 f/u Dr. Letta Pate.  . Personal history of colonic adenoma 10/30/2012   Diminutive adenoma, consider repeat 2019 per GI  . PFO (patent foramen ovale) 09/2019   small, with predominately L to R shunt  . Pneumonia   . Presence of cardiac defibrillator 11/07/2017  . Primary osteoarthritis of both knees    Bone on bone of medial compartments, + signif patellofemoral arth bilat.--supartz inj series started 09/12/17  . 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 PUD (peptic ulcer disease)   . PULMONARY HYPERTENSION, HX OF   . Secondary male hypogonadism 2017  . Sepsis ([place]) 04/29/2020  . 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 Shortness of breath    with exertion: much improved s/p TAVR and treatment for CHF.  . Sick sinus syndrome (HCC)    PPM placed  . Thrombocytopenia ([place]) 2018   HSM on 2007 abd u/s---suspect some mild splenic sequestration chronically.  [name] Unspecified glaucoma(365.9)   . Unspecified hereditary and idiopathic peripheral neuropathy approx age 25   bilat LE's, ? left arm, too.  Feet became progressively numb + left foot pain intermittently.  Pt  may be trying a spinal stimulator (as of 05/2015)  . VENOUS INSUFFICIENCY    Being followed by Dr. Sharol Given as of 10/2016 for two R LL venous stasis ulcers/skin tears.  Healed as of 10/30/16 f/u with Dr. Sharol Given.  . VENTRAL HERNIA   . Wound dehiscence 10/22/2019    Family History  Problem Relation Age of Onset  . Hypertension Mother   . Coronary artery disease Mother   . Heart attack Mother   . Neuropathy Mother   . Pulmonary fibrosis Father        asbestosis    Past Surgical History:  Procedure Laterality Date  . AMPUTATION Left 04/11/2013   Procedure: AMPUTATION DIGIT Left 3rd toe;  Surgeon: [name], MD;  Location: [place];  Service: Orthopedics;  Laterality: Left;  Left 3rd toe amputation at [facility]  . AMPUTATION Left 08/29/2019   Procedure: LEFT TRANSMETATARSAL AMPUTATION;  Surgeon: [name], MD;  Location: [place];  Service: Orthopedics;  Laterality: Left;  . BIOPSY  05/04/2020   Procedure: BIOPSY;  Surgeon: Jackquline Denmark, MD;  Location: [facility] ENDOSCOPY;  Service: Endoscopy;;  . [place]; 03/10/16   1997 Non-obstructive disease.  03/2016 BMS to RCA, with 25% pDiag dz, o/w normal cors per cath 03/07/16.  Cath 11/01/16: in stent restenosis, successful baloon angioplasty.  [name] CARDIAC CATHETERIZATION  12/24/2012   mild < 20% LCx, prox 30% RCA; LVEF 55-65% , moderate pulmonary HTN, moderate AS  . CARDIAC DEFIBRILLATOR PLACEMENT  11/07/2017   [facility] MRI Quad CRT defibrillator  . CARDIOVASCULAR STRESS TEST  05/11/16 ([facility])   Myocardial perfusion imaging:  No ischemia; scar in apex, global hypokinesis, EF 36%.  . Carotid dopplers  03/09/2016   [facility]: no hemodynamically significant stenosis on either side.  . CHOLECYSTECTOMY    . COLONOSCOPY [name] 10/30/2012   Procedure: COLONOSCOPY;  Surgeon: [name], MD;  Location: [facility] ENDOSCOPY;  Service: Endoscopy;  Laterality: [name];  . CORONARY ANGIOPLASTY WITH STENT PLACEMENT  03/2016; 04/2017   2017-[facility]: BMS to RCA-pt was placed on  Brilinta.  04/2017: DES to RCA.  . DORSAL SLIT [name]  10/29/2019   for severe phimosis. Procedure: DORSAL SLIT;  Surgeon: Alexis Frock, MD;  Location: [facility];  Service: Urology;  Laterality: [name];  62 MINS  . ESOPHAGEAL DILATION  05/04/2020   Procedure: ESOPHAGEAL DILATION;  Surgeon: Jackquline Denmark, MD;  Location: [facility] ENDOSCOPY;  Service: Endoscopy;;  . ESOPHAGOGASTRODUODENOSCOPY (EGD) WITH PROPOFOL [name] 05/04/2020   Procedure: ESOPHAGOGASTRODUODENOSCOPY (EGD) WITH PROPOFOL;  Surgeon: Jackquline Denmark, MD;  Location: [facility] ENDOSCOPY;  Service: Endoscopy;  Laterality: [name];  . EYE SURGERY Bilateral cataract  . HEMORRHOID SURGERY    . INTRAOCULAR LENS INSERTION Bilateral   . KNEE SURGERY Right   . LEFT AND RIGHT HEART CATHETERIZATION WITH CORONARY ANGIOGRAM [name] 12/24/2012   Procedure: LEFT AND RIGHT HEART CATHETERIZATION WITH CORONARY ANGIOGRAM;  Surgeon: [name], MD;  Location: [facility] CATH LAB;  Service: Cardiovascular;  Laterality: [name];  . LEG SURGERY Bilateral    lenghtening   . PACEMAKER PLACEMENT  04/13/2016   2nd deg HB after TAVR, pt had DC MDT PPM placed.  [name] SHOULDER ARTHROSCOPY  08/30/2011   Procedure: ARTHROSCOPY SHOULDER;  Surgeon: [name], MD;  Location: [place];  Service: Orthopedics;  Laterality: Right;  Right Shoulder Arthroscopy, Debridement, and Decompression  . SPINAL CORD STIMULATOR INSERTION [name] 09/10/2015   Procedure: LUMBAR SPINAL CORD STIMULATOR INSERTION;  Surgeon: [name], MD;  Location: [place]O [facility]S;  Service: Neurosurgery;  Laterality: [name];  . TEE WITHOUT CARDIOVERSION [name] 09/16/2019   Procedure: TRANSESOPHAGEAL ECHOCARDIOGRAM (TEE);  Surgeon: Skeet Latch, MD;  Location: [place];  Service: Cardiovascular;  Laterality: [name];  . TEE WITHOUT CARDIOVERSION [name] 12/02/2019   +vegetation on AVR and pacer lead in RV.  EF 55-60%, normal wall motion.  Valves function normal.  Procedure: TRANSESOPHAGEAL ECHOCARDIOGRAM (TEE);  Surgeon: Geralynn Rile, MD;  Location: [place];  Service: Cardiovascular;  Laterality: [name];  . 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  R big toe surg due to osteoarth  . TONSILLECTOMY    . traeculectomy Left    eye  . TRANSCATHETER AORTIC VALVE REPLACEMENT, TRANSFEMORAL  04/11/2016  . TRANSESOPHAGEAL ECHOCARDIOGRAM  03/09/2016; 09/2019   [facility]: EF 55-60%, PFO seen with bi-directional shunting, no thrombus in appendage.  09/2019 ->no valvular vegetations. Small patent foramen ovale with predominantly left to right shunting across the interatrial septum.  . TRANSTHORACIC ECHOCARDIOGRAM  01/2015; 01/2016; 05/18/16; 09/18/16, 05/2017, 08/2017   01/2015 No signif change in aortic stenosis (moderate).  01/2016 Severe LVH w/small LV cavity, EF 60-65%, grade I diast dysfxn.  05/2016 (s/p TAVR): EF 50-55%, grd I DD, biopros AV good.  08/2016--EF 50-55%, LV septal motion c/w conduction abnl, grd I DD,mild MS,bioprosth aortic valve well seated, w/trace AR. 05/2017 TTE EF 35%. 08/2017-EF 35%, mod diff hypokin LV, grd I DD, biopros AV good.   . TRANSTHORACIC ECHOCARDIOGRAM  04/2018; 09/2019   04/2018: EF 40-45%, mod diffuse LV hypokinesis, grd I DD, bioprosth AV well seated, no AS or AR. 09/2019 EF 60-65%, grd I DD, valves fine, including bioprosth AV  . VITRECTOMY     Social History   Occupational History  . Occupation: Engineer, production    Comment: retired  Tobacco Use  . Smoking status: Never Smoker  . Smokeless tobacco: Never Used  Vaping Use  . Vaping Use: Never used  Substance and Sexual Activity  . Alcohol use: No    Alcohol/week: 0.0 standard drinks  . Drug use: No    Types: Oxycodone  . Sexual activity: Not Currently

## 2020-06-07 NOTE — Telephone Encounter (Signed)
Patient called asked for a call back because he is concerned about having surgery. Patient said he want  to speak with Dr Sharol Given or Autumn. Patient said with Covid-19 still going around he is very concerned about moving forward with the surgery. The number to contact patient is [phone]

## 2020-06-08 NOTE — Telephone Encounter (Signed)
Check and see if there is a sooner stress test

## 2020-06-09 ENCOUNTER — Other Ambulatory Visit: Payer: Self-pay | Admitting: Family Medicine

## 2020-06-09 NOTE — Telephone Encounter (Signed)
Tim [name] in [facility] [phone] cards number. Pt is really worried about how far out his clearance is scheduled for. Can you see if there is anything that you can do to move it up so we can have BKA before the end of the year.?

## 2020-06-11 NOTE — Telephone Encounter (Signed)
Called Dr. Theodosia Blender office and left message with the scheduler to see if stress test could be moved up so that we could do surgery by the end of the year.  Someone will call back to let us know.  I called Mr. Chavira and left a voicemail for him to return my call to discuss.

## 2020-06-22 ENCOUNTER — Ambulatory Visit (INDEPENDENT_AMBULATORY_CARE_PROVIDER_SITE_OTHER): Payer: Medicare Other | Admitting: Infectious Disease

## 2020-06-22 ENCOUNTER — Other Ambulatory Visit: Payer: Self-pay

## 2020-06-22 VITALS — BP 171/74 | HR 80 | Temp 97.9°F

## 2020-06-22 DIAGNOSIS — M86272 Subacute osteomyelitis, left ankle and foot: Secondary | ICD-10-CM

## 2020-06-22 DIAGNOSIS — I350 Nonrheumatic aortic (valve) stenosis: Secondary | ICD-10-CM

## 2020-06-22 DIAGNOSIS — Z952 Presence of prosthetic heart valve: Secondary | ICD-10-CM

## 2020-06-22 DIAGNOSIS — I38 Endocarditis, valve unspecified: Secondary | ICD-10-CM

## 2020-06-22 DIAGNOSIS — T827XXD Infection and inflammatory reaction due to other cardiac and vascular devices, implants and grafts, subsequent encounter: Secondary | ICD-10-CM

## 2020-06-22 DIAGNOSIS — T826XXD Infection and inflammatory reaction due to cardiac valve prosthesis, subsequent encounter: Secondary | ICD-10-CM

## 2020-06-22 NOTE — Progress Notes (Signed)
Subjective:    Patient ID: Tim [name], male    DOB: Feb 12, 1941, 79 y.o.   MRN: [id number] Chief complaint: Concerned about infection recurring yet again in the future. HPI  79 y.o. male with medical history listed below is well known to the ID service and presented to the ER in November after starting to have fever, chills, and weakness.   Tim [name] initially presented with AMP sensitive enterococcal UTI complicated by enterococcal bacteremia in March 2021 in the setting of AICD and TAVR. TTE and TEE at the time were without evidence of vegetation and was prescribed 2 weeks of ampicillin. On follow up on 4/21 there was worsening of his amputation site with erythema and drainage. He was placed on doxycycline by Dr. Sharol Given which was then changed to Zyvox by Dr. Tommy Medal. This resolved his symptoms. On follow up with Dr. Tommy Medal on 5/26 he was experiencing fevers and dizziness with concern for systemic infection. Blood cultures in the ID clinic were positive for Enterococcus faecalis bacteremia. Surgical site was well healed at that time. 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 Recommended treatment was 6 weeks of ampicillin and ceftriaxone through 6/21. Feeling better on 6/21 and transitioned to life-long suppression with amoxicillin three times daily.   Tim [name] was then seen on 7/15 seen by Dr. Johnnye Sima with concern for cellulitis and was continued on his Amoxicillin. He followed up with Dr. Johnnye Sima on 8/6 and continued to do well with Amoxicillin. Last seen by Dr. Tommy Medal on 9/13 and was continued on Amoxicillin and amputation site was followed by Dr. Sharol Given.   Blood cultures no growth but MV had vegetation. TEE was not successful.  He is currently being treated again with ampicillin and high-dose ceftriaxone.  Plan is for him to complete 6 weeks followed by chronic higher dose amoxicillin of 1000 mg twice daily.   He has completed IV antibiotic therapy.    He  has had worsening of his amputation site after leaving the hospital and has follow-up with Dr. Sharol Given.   He back on 1000 mg of amoxicillin twice daily.  Wound persists and he is seen Dr. Sharol Given who recommends below the knee amputation.  While the patient is agreeable to BKA he is concerned about having to stay in a skilled nursing facility and what looks to be the height of the Covid surge with Omicron.  Scheduled for cardiac catheterization on January 10.  I have counseled him to keep the appointment for cardiac catheterization but that I understand his concern about COVID-19 risk should he have surgery and that he could continue on antibiotics and continue to monitor the site for any progression and potentially delay surgery.  There is risk of course because he certainly could develop a new infection from this portal that would not be sensitive to the amoxicillin and could cause a new infection of his heart valve and/or pacemaker besides putting his life at risk from sepsis.  Past Medical History:  Diagnosis Date  . AICD (automatic cardioverter/defibrillator) present   . ASTHMA   . Asthma    as a child  . Balanitis    +severe phimosis+ buried penis->circ not possible but dorsal slit done 10/2019  . BPH (benign prostatic hypertrophy)    with urinary retention.  Renal u/s 12/04/19 NORMAL KIDNEYS, NORMAL BLADDER, NO HYDRONEPHROSIS  . CAD (coronary artery disease)    Nonobstructive by 12/2012 cath; then 03/2016 he required BMS to RCA ([facility]).  In-stent restenosis on cath 11/01/16, baloon angioplasty successful--pt to take plavix (no ASA), + eliquis (for PAF).  Tim [name] CATARACT, HX OF   . Chronic combined systolic and diastolic CHF (congestive heart failure) ([place]) 05/2016   Ischemic CM.  04/2018 EF 40-45%, grd I DD.  Tim [name] Chronic pain syndrome    Lumbar DDD; chronic neuropathic pain (DM); has spinal stimulator and sees pain mgmt MD  . Complicated UTI (urinary tract infection) 09/2019   Phimoses, acute  urinary retention, entoerococcus UTI, enterococc bacteremia.  F/u blood clx's neg x 5d.    Tim [name] COPD   . Diabetes mellitus type 2 with complications (HCC)    LYY5K jumped from 5.7% to 6.1% 03/2015---started metformin at that time.  DM 2 dx by fasting gluc criteria 2018.  Has chronic neuropath pain  . Elevated transaminase level 02/2016   Suspect due to fatty liver (documented on u/s 2007). Plan repeat labs 03/2016.  Tim [name] Enterococcal bacteremia 09/2019   Phimoses, acute urinary retention, entoerococcus UTI, enterococc bacteremia.  F/u blood clx's neg x 5d.    . Essential hypertension, benign   . Fatty liver 2007   2007 u/s showed fatty liver with hepatosplenomegaly.  2019 repeat u/s->fatty liver but no cirrhosis or hepatosplenomegaly.  . FUO (fever of unknown origin) 11/26/2019  . GERD (gastroesophageal reflux disease)   . Glaucoma   . GOUT   . Hallux valgus (acquired)   . HH (hiatus hernia)   . HYPERCHOLESTEROLEMIA-PURE   . Hypogonadism male   . ICD (implantable cardioverter-defibrillator) infection ([place]) 12/22/2019  . 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 Tommy Medal)  . Lumbosacral neuritis   . Lumbosacral spondylosis    Lumbar spinal stenosis with neurogenic claudication--contributes to his chronic pain syndrome  . Normal memory function 08/2014   Neuropsychological testing ([facility]): no cognitive impairment or sign of neurodegenerative disorder.  Likely has adjustment d/o with mixed anxiety/depressed features and may benefit from low dose SNRI.    Tim [name] Normocytic anemia 03/2016   Mild-pt needs ferritin and vit B12 level checked (as of 03/22/16). Hb stable 09/2019  . NSTEMI (non-ST elevated myocardial infarction) ([place]) 03/20/2016   BMS to RCA  . Obesity hypoventilation syndrome ([place])   . Obesity, unspecified   . Orthostatic hypotension   . OSA on CPAP    8 cm H2O  . OSTEOARTHRITIS    . Paroxysmal atrial fibrillation ([place]) 2003    (? chronic?) Off anticoag for a while due to falls.  Then apixaban started 12/2014.  Tim [name] Peripheral neuropathy    DPN (+[place]; with chronic neuropathic pain--Dr. Ella Bodo): neuropathic pain->diff to treat, failed nucynta, failed spinal stimul trial, oxycontin hs + tramadol + gabap as of 12/2017 f/u Dr. Letta Pate.  . Personal history of colonic adenoma 10/30/2012   Diminutive adenoma, consider repeat 2019 per GI  . PFO (patent foramen ovale) 09/2019   small, with predominately L to R shunt  . Pneumonia   . Presence of cardiac defibrillator 11/07/2017  . Primary osteoarthritis of both knees    Bone on bone of medial compartments, + signif patellofemoral arth bilat.--supartz inj series started 09/12/17  . 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 PUD (peptic ulcer disease)   . PULMONARY HYPERTENSION, HX OF   . Secondary male hypogonadism 2017  . Sepsis ([place]) 04/29/2020  . Severe aortic stenosis    by cath by [facility] 03/2016; CT surgery saw him and did  TAVR 04/11/16 ([facility])  . Shortness of breath    with exertion: much improved s/p TAVR and treatment for CHF.  . Sick sinus syndrome (HCC)    PPM placed  . Thrombocytopenia ([place]) 2018   HSM on 2007 abd u/s---suspect some mild splenic sequestration chronically.  Tim [name] Unspecified glaucoma(365.9)   . Unspecified hereditary and idiopathic peripheral neuropathy approx age 67   bilat LE's, ? left arm, too.  Feet became progressively numb + left foot pain intermittently.  Pt may be trying a spinal stimulator (as of 05/2015)  . VENOUS INSUFFICIENCY    Being followed by Dr. Sharol Given as of 10/2016 for two R LL venous stasis ulcers/skin tears.  Healed as of 10/30/16 f/u with Dr. Sharol Given.  . VENTRAL HERNIA   . Wound dehiscence 10/22/2019    Past Surgical History:  Procedure Laterality Date  . AMPUTATION Left 04/11/2013   Procedure: AMPUTATION DIGIT Left 3rd toe;  Surgeon: [name], MD;  Location: [facility];   Service: Orthopedics;  Laterality: Left;  Left 3rd toe amputation at [facility]  . AMPUTATION Left 08/29/2019   Procedure: LEFT TRANSMETATARSAL AMPUTATION;  Surgeon: [name], MD;  Location: [place];  Service: Orthopedics;  Laterality: Left;  . BIOPSY  05/04/2020   Procedure: BIOPSY;  Surgeon: Jackquline Denmark, MD;  Location: [facility] ENDOSCOPY;  Service: Endoscopy;;  . [place]; 03/10/16   1997 Non-obstructive disease.  03/2016 BMS to RCA, with 25% pDiag dz, o/w normal cors per cath 03/07/16.  Cath 11/01/16: in stent restenosis, successful baloon angioplasty.  Tim [name] CARDIAC CATHETERIZATION  12/24/2012   mild < 20% LCx, prox 30% RCA; LVEF 55-65% , moderate pulmonary HTN, moderate AS  . CARDIAC DEFIBRILLATOR PLACEMENT  11/07/2017   [facility] MRI Quad CRT defibrillator  . CARDIOVASCULAR STRESS TEST  05/11/16 ([facility])   Myocardial perfusion imaging:  No ischemia; scar in apex, global hypokinesis, EF 36%.  . Carotid dopplers  03/09/2016   [facility]: no hemodynamically significant stenosis on either side.  . CHOLECYSTECTOMY    . COLONOSCOPY [name] 10/30/2012   Procedure: COLONOSCOPY;  Surgeon: [name], MD;  Location: [facility] ENDOSCOPY;  Service: Endoscopy;  Laterality: [name];  . CORONARY ANGIOPLASTY WITH STENT PLACEMENT  03/2016; 04/2017   2017-[facility]: BMS to RCA-pt was placed on Brilinta.  04/2017: DES to RCA.  . DORSAL SLIT [name] 10/29/2019   for severe phimosis. Procedure: DORSAL SLIT;  Surgeon: Alexis Frock, MD;  Location: [facility] ORS;  Service: Urology;  Laterality: [name];  7 MINS  . ESOPHAGEAL DILATION  05/04/2020   Procedure: ESOPHAGEAL DILATION;  Surgeon: Jackquline Denmark, MD;  Location: [facility] ENDOSCOPY;  Service: Endoscopy;;  . ESOPHAGOGASTRODUODENOSCOPY (EGD) WITH PROPOFOL [name] 05/04/2020   Procedure: ESOPHAGOGASTRODUODENOSCOPY (EGD) WITH PROPOFOL;  Surgeon: Jackquline Denmark, MD;  Location: [facility] ENDOSCOPY;  Service: Endoscopy;  Laterality: [name];  . EYE SURGERY Bilateral cataract  . HEMORRHOID SURGERY    . INTRAOCULAR  LENS INSERTION Bilateral   . KNEE SURGERY Right   . LEFT AND RIGHT HEART CATHETERIZATION WITH CORONARY ANGIOGRAM [name] 12/24/2012   Procedure: LEFT AND RIGHT HEART CATHETERIZATION WITH CORONARY ANGIOGRAM;  Surgeon: [name], MD;  Location: [facility] CATH LAB;  Service: Cardiovascular;  Laterality: [name];  . LEG SURGERY Bilateral    lenghtening   . PACEMAKER PLACEMENT  04/13/2016   2nd deg HB after TAVR, pt had DC MDT PPM placed.  Tim [name] SHOULDER ARTHROSCOPY  08/30/2011   Procedure: ARTHROSCOPY SHOULDER;  Surgeon: [name], MD;  Location: [facility];  Service: Orthopedics;  Laterality: Right;  Right Shoulder Arthroscopy, Debridement, and Decompression  . SPINAL CORD STIMULATOR INSERTION [name] 09/10/2015   Procedure: LUMBAR SPINAL CORD STIMULATOR INSERTION;  Surgeon: [name], MD;  Location: [facility]URO ORS;  Service: Neurosurgery;  Laterality: [name];  . TEE WITHOUT CARDIOVERSION [name] 09/16/2019   Procedure: TRANSESOPHAGEAL ECHOCARDIOGRAM (TEE);  Surgeon: Skeet Latch, MD;  Location: [facility];  Service: Cardiovascular;  Laterality: [name];  . TEE WITHOUT CARDIOVERSION [name] 12/02/2019   +vegetation on AVR and pacer lead in RV.  EF 55-60%, normal wall motion.  Valves function normal.  Procedure: TRANSESOPHAGEAL ECHOCARDIOGRAM (TEE);  Surgeon: Geralynn Rile, MD;  Location: [facility];  Service: Cardiovascular;  Laterality: [name];  . 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  R big toe surg due to osteoarth  . TONSILLECTOMY    . traeculectomy Left    eye  . TRANSCATHETER AORTIC VALVE REPLACEMENT, TRANSFEMORAL  04/11/2016  . TRANSESOPHAGEAL ECHOCARDIOGRAM  03/09/2016; 09/2019   [facility]: EF 55-60%, PFO seen with bi-directional shunting, no thrombus in appendage.  09/2019 ->no valvular vegetations. Small patent foramen ovale with predominantly left to right shunting across the interatrial septum.  . TRANSTHORACIC ECHOCARDIOGRAM  01/2015; 01/2016; 05/18/16; 09/18/16, 05/2017, 08/2017   01/2015 No signif change in  aortic stenosis (moderate).  01/2016 Severe LVH w/small LV cavity, EF 60-65%, grade I diast dysfxn.  05/2016 (s/p TAVR): EF 50-55%, grd I DD, biopros AV good.  08/2016--EF 50-55%, LV septal motion c/w conduction abnl, grd I DD,mild MS,bioprosth aortic valve well seated, w/trace AR. 05/2017 TTE EF 35%. 08/2017-EF 35%, mod diff hypokin LV, grd I DD, biopros AV good.   . TRANSTHORACIC ECHOCARDIOGRAM  04/2018; 09/2019   04/2018: EF 40-45%, mod diffuse LV hypokinesis, grd I DD, bioprosth AV well seated, no AS or AR. 09/2019 EF 60-65%, grd I DD, valves fine, including bioprosth AV  . VITRECTOMY      Family History  Problem Relation Age of Onset  . Hypertension Mother   . Coronary artery disease Mother   . Heart attack Mother   . Neuropathy Mother   . Pulmonary fibrosis Father        asbestosis      Social History   Socioeconomic History  . Marital status: Married    Spouse name: Not on file  . Number of children: 0  . Years of education: 96  . Highest education level: Not on file  Occupational History  . Occupation: Engineer, production    Comment: retired  Tobacco Use  . Smoking status: Never Smoker  . Smokeless tobacco: Never Used  Vaping Use  . Vaping Use: Never used  Substance and Sexual Activity  . Alcohol use: No    Alcohol/week: 0.0 standard drinks  . Drug use: No    Types: Oxycodone  . Sexual activity: Not Currently  Other Topics Concern  . Not on file  Social History Narrative   HSG, [name] - BS, [name] - MS-engineering, 2 years on PhD - [employer]. Married - '65 - 28yr/divorced; '76- 3 yrs/divorced; '92 . No children. 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 ACP/Living Will - Yes CPR; long-term Mechanical ventilation as long as he was able to cognate; ok for long term artificial nutrition. Precondition being able to cognate and not to have too much pain.    Social Determinants of Health   Financial Resource Strain: Not on file   Food Insecurity: Not on  file  Transportation Needs: Not on file  Physical Activity: Not on file  Stress: Not on file  Social Connections: Not on file    Allergies  Allergen Reactions  . Brimonidine Tartrate Shortness Of Breath    Alphagan-Shortness of breath  . Brinzolamide Shortness Of Breath    AZOPT- Shortness of breath  . Latanoprost Shortness Of Breath    XALATAN- Shortness of breath  . Nucynta [Tapentadol] Shortness Of Breath  . Sulfa Antibiotics Palpitations  . Timolol Maleate Shortness Of Breath and Other (See Comments)    TIMOPTIC- Aggravated asthma  . Diltiazem Swelling     leg swelling  . Rofecoxib Swelling     VIOXX- leg swelling  . Vancomycin Hives and Other (See Comments)    Possible "Red Man Syndrome"? > hives/blisters  . Codeine Other (See Comments)    Childhood reaction  . Tamsulosin Other (See Comments)    Dizziness   . Celecoxib Other (See Comments)    CELLBREX-confusion  . Colchicine Diarrhea    diarrhea  . Tape Rash     Current Outpatient Medications:  .  albuterol (PROVENTIL HFA;VENTOLIN HFA) 108 (90 BASE) MCG/ACT inhaler, Inhale 2 puffs into the lungs 4 (four) times daily as needed for wheezing or shortness of breath., Disp: , Rfl:  .  allopurinol (ZYLOPRIM) 300 MG tablet, TAKE 1 TABLET BY MOUTH ONCE DAILY WITH FOOD, Disp: 90 tablet, Rfl: 1 .  amoxicillin (AMOXIL) 500 MG tablet, Take 2 tablets (1,000 mg total) by mouth 2 (two) times daily., Disp: 120 tablet, Rfl: 11 .  apixaban (ELIQUIS) 5 MG TABS tablet, Take 5 mg by mouth 2 (two) times daily., Disp: , Rfl:  .  ASPIRIN LOW DOSE 81 MG EC tablet, Take 81 mg by mouth daily., Disp: , Rfl:  .  B Complex-C (B-COMPLEX WITH VITAMIN C) tablet, Take 1 tablet by mouth daily. Take 1 tablet daily, 251m, Disp: , Rfl:  .  ezetimibe (ZETIA) 10 MG tablet, Take 1 tablet (10 mg total) by mouth daily., Disp: 30 tablet, Rfl: 1 .  finasteride (PROSCAR) 5 MG tablet, Take 1 tablet (5 mg total) by mouth daily., Disp: 90  tablet, Rfl: 0 .  fluticasone (FLONASE) 50 MCG/ACT nasal spray, Place 2 sprays into both nostrils as needed for allergies or rhinitis., Disp: , Rfl:  .  gabapentin (NEURONTIN) 800 MG tablet, TAKE 1 TABLET (800 MG TOTAL) BY MOUTH 4 (FOUR) TIMES DAILY., Disp: 360 tablet, Rfl: 2 .  KLOR-CON M20 20 MEQ tablet, Take 20 mEq by mouth daily., Disp: , Rfl:  .  metoprolol succinate (TOPROL-XL) 25 MG 24 hr tablet, Take 25 mg by mouth daily., Disp: , Rfl:  .  mometasone-formoterol (DULERA) 100-5 MCG/ACT AERO, Inhale 2 puffs into the lungs 2 (two) times daily for 60 doses., Disp: 1 each, Rfl: 0 .  Multiple Vitamin (MULTIVITAMIN ADULT PO), Take by mouth daily., Disp: , Rfl:  .  niacin (NIASPAN) 1000 MG CR tablet, TAKE 1 TABLET BY MOUTH TWICE A DAY, Disp: 180 tablet, Rfl: 0 .  nitroGLYCERIN (NITROSTAT) 0.4 MG SL tablet, Place 0.4 mg under the tongue every 5 (five) minutes as needed for chest pain. , Disp: , Rfl:  .  pantoprazole (PROTONIX) 40 MG tablet, TAKE 1 TABLET BY MOUTH EVERY DAY, Disp: 90 tablet, Rfl: 3 .  rosuvastatin (CRESTOR) 40 MG tablet, take 1 tablet by mouth once daily (Patient taking differently: Take 40 mg by mouth daily. ), Disp: 15 tablet, Rfl: 0 .  sacubitril-valsartan (ENTRESTO)  24-26 MG, Take 1 tablet by mouth 2 (two) times daily. , Disp: , Rfl:  .  spironolactone (ALDACTONE) 25 MG tablet, Take 0.5 tablets (12.5 mg total) by mouth daily., Disp: 15 tablet, Rfl: 0 .  torsemide (DEMADEX) 20 MG tablet, Take 1 tablet (20 mg total) by mouth in the morning and at bedtime., Disp: 60 tablet, Rfl: 0 .  vitamin C (ASCORBIC ACID) 250 MG tablet, Take 250 mg by mouth daily., Disp: , Rfl:  .  VITAMIN D, CHOLECALCIFEROL, PO, Take 1 tablet by mouth daily. , Disp: , Rfl:  .  WIXELA INHUB 250-50 MCG/DOSE AEPB, TAKE 1 PUFF BY MOUTH TWICE A DAY, Disp: 60 each, Rfl: 11   Review of Systems  Constitutional: Negative for activity change, appetite change, chills, diaphoresis, fatigue, fever and unexpected weight  change.  HENT: Negative for congestion, rhinorrhea, sinus pressure, sneezing, sore throat and trouble swallowing.   Eyes: Negative for photophobia and visual disturbance.  Respiratory: Negative for cough, chest tightness, shortness of breath, wheezing and stridor.   Cardiovascular: Negative for chest pain, palpitations and leg swelling.  Gastrointestinal: Negative for abdominal distention, abdominal pain, anal bleeding, blood in stool, constipation, diarrhea, nausea and vomiting.  Genitourinary: Negative for difficulty urinating, dysuria, flank pain and hematuria.  Musculoskeletal: Negative for arthralgias, back pain, gait problem, joint swelling and myalgias.  Skin: Positive for wound. Negative for color change, pallor and rash.  Neurological: Negative for dizziness, tremors, weakness and light-headedness.  Hematological: Negative for adenopathy. Does not bruise/bleed easily.  Psychiatric/Behavioral: Negative for agitation, behavioral problems, confusion, decreased concentration, dysphoric mood and sleep disturbance.       Objective:   Physical Exam Constitutional:      General: He is not in acute distress.    Appearance: Normal appearance. He is well-developed. He is not ill-appearing or diaphoretic.  HENT:     Head: Normocephalic and atraumatic.     Right Ear: Hearing and external ear normal.     Left Ear: Hearing and external ear normal.     Nose: No nasal deformity or rhinorrhea.  Eyes:     General: No scleral icterus.    Conjunctiva/sclera: Conjunctivae normal.     Right eye: Right conjunctiva is not injected.     Left eye: Left conjunctiva is not injected.     Pupils: Pupils are equal, round, and reactive to light.  Neck:     Vascular: No JVD.  Cardiovascular:     Rate and Rhythm: Normal rate and regular rhythm.     Heart sounds: S1 normal and S2 normal. No murmur heard. No friction rub. No gallop.   Pulmonary:     Effort: Pulmonary effort is normal. No respiratory  distress.     Breath sounds: No stridor. No rhonchi.  Abdominal:     General: There is no distension.     Palpations: Abdomen is soft.  Musculoskeletal:        General: Normal range of motion.     Right shoulder: Normal.     Left shoulder: Normal.     Cervical back: Normal range of motion and neck supple.     Right hip: Normal.     Left hip: Normal.     Right knee: Normal.     Left knee: Normal.  Lymphadenopathy:     Head:     Right side of head: No submandibular, preauricular or posterior auricular adenopathy.     Left side of head: No submandibular, preauricular or posterior auricular adenopathy.  Cervical: No cervical adenopathy.     Right cervical: No superficial or deep cervical adenopathy.    Left cervical: No superficial or deep cervical adenopathy.  Skin:    General: Skin is warm and dry.     Coloration: Skin is not pale.     Findings: No abrasion, bruising, ecchymosis, erythema, lesion or rash.     Nails: There is no clubbing.  Neurological:     General: No focal deficit present.     Mental Status: He is alert and oriented to person, place, and time.     Sensory: No sensory deficit.     Coordination: Coordination normal.     Gait: Gait normal.  Psychiatric:        Attention and Perception: He is attentive.        Mood and Affect: Mood normal.        Speech: Speech normal.        Behavior: Behavior normal. Behavior is cooperative.        Thought Content: Thought content normal.        Judgment: Judgment normal.       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  BKA  sounds like the most reasonable option to pursue the only issue is the patient's concern which is not inappropriate about risk of getting COVID-19 in the context of current surge  Counseled him to keep his appointment for cardiac catheterization and keep an eye on the pandemic certainly if he is worried about risk of acquiring this virus in a skilled nursing facility can continue to try to put off surgery and watch his wound very carefully.  Coivd 19 prevention he has had 3 doses of Pfizer. Mask, distance.

## 2020-06-30 ENCOUNTER — Other Ambulatory Visit: Payer: Self-pay | Admitting: Family Medicine

## 2020-07-05 ENCOUNTER — Encounter: Payer: Self-pay | Admitting: Internal Medicine

## 2020-07-06 ENCOUNTER — Encounter: Payer: Self-pay | Admitting: Family Medicine

## 2020-07-06 ENCOUNTER — Other Ambulatory Visit: Payer: Self-pay | Admitting: Family Medicine

## 2020-07-13 ENCOUNTER — Other Ambulatory Visit: Payer: Self-pay | Admitting: Family Medicine

## 2020-07-14 ENCOUNTER — Other Ambulatory Visit: Payer: Self-pay

## 2020-07-14 ENCOUNTER — Encounter: Payer: Self-pay | Admitting: Infectious Disease

## 2020-07-14 ENCOUNTER — Ambulatory Visit (INDEPENDENT_AMBULATORY_CARE_PROVIDER_SITE_OTHER): Payer: Medicare Other | Admitting: Infectious Disease

## 2020-07-14 VITALS — BP 125/71 | HR 84 | Temp 98.6°F

## 2020-07-14 DIAGNOSIS — I38 Endocarditis, valve unspecified: Secondary | ICD-10-CM

## 2020-07-14 DIAGNOSIS — T826XXD Infection and inflammatory reaction due to cardiac valve prosthesis, subsequent encounter: Secondary | ICD-10-CM | POA: Diagnosis not present

## 2020-07-14 DIAGNOSIS — M86272 Subacute osteomyelitis, left ankle and foot: Secondary | ICD-10-CM | POA: Diagnosis not present

## 2020-07-14 DIAGNOSIS — T827XXD Infection and inflammatory reaction due to other cardiac and vascular devices, implants and grafts, subsequent encounter: Secondary | ICD-10-CM

## 2020-07-14 DIAGNOSIS — B952 Enterococcus as the cause of diseases classified elsewhere: Secondary | ICD-10-CM

## 2020-07-14 DIAGNOSIS — N390 Urinary tract infection, site not specified: Secondary | ICD-10-CM | POA: Diagnosis not present

## 2020-07-14 DIAGNOSIS — J310 Chronic rhinitis: Secondary | ICD-10-CM

## 2020-07-14 HISTORY — DX: Chronic rhinitis: J31.0

## 2020-07-14 NOTE — Progress Notes (Signed)
Subjective:    Patient ID: Tim [name], male    DOB: 18-Aug-1940, 80 y.o.   MRN: [id number] Chief complaint: Concerned about infection recurring yet again in the future. HPI  80 y.o. male who  initially presented with AMP sensitive enterococcal UTI complicated by enterococcal bacteremia in March 2021 in the setting of AICD and TAVR. TTE and TEE at the time were without evidence of vegetation and was prescribed 2 weeks of ampicillin. On follow up on 4/21 there was worsening of his amputation site with erythema and drainage. He was placed on doxycycline by Dr. Sharol Given which was then changed to Zyvox by Dr. Tommy Medal. This resolved his symptoms. On follow up with Dr. Tommy Medal on 5/26 he was experiencing fevers and dizziness with concern for systemic infection. Blood cultures in the ID clinic were positive for Enterococcus faecalis bacteremia. Surgical site was well healed at that time. 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 Recommended treatment was 6 weeks of ampicillin and ceftriaxone through 6/21. Feeling better on 6/21 and transitioned to life-long suppression with amoxicillin three times daily.   Tim [name] was then seen on 7/15 seen by Dr. Johnnye Sima with concern for cellulitis and was continued on his Amoxicillin. He followed up with Dr. Johnnye Sima on 8/6 and continued to do well with Amoxicillin. Last seen by Dr. Tommy Medal on 9/13 and was continued on Amoxicillin and amputation site was followed by Dr. Sharol Given.   Blood cultures no growth but MV had vegetation. TEE was not successful.  He is currently being treated again with ampicillin and high-dose ceftriaxone.  Plan is for him to complete 6 weeks followed by chronic higher dose amoxicillin of 1000 mg twice daily.   He has completed IV antibiotic therapy.    He has had worsening of his amputation site after leaving the hospital and has follow-up with Dr. Sharol Given.   He back on 1000 mg of amoxicillin twice daily.  Wound  persists and he is seen Dr. Sharol Given who recommends below the knee amputation.  While the patient is agreeable to BKA he is concerned about having to stay in a skilled nursing facility and what looks to be the height of the Covid surge with Omicron.  Scheduled for cardiac catheterization on January 10.  He ended up canceling the cardiac catheterization because you had a profound runny nose that he said would have made it impossible for him to wear a mask.  He has had runny nose for the past 10 days that is getting better now.  This certainly could have been COVID-19 and I was going to test him if it was an acute symptom but since it has been present for 10 days will not use the resources will not affect management of the patient.  He is having a lot of second thoughts about transtibial amputation.  He will make an appointment again for cardiac catheterization and another appointment with Dr. Sharol Given as well.  Past Medical History:  Diagnosis Date  . AICD (automatic cardioverter/defibrillator) present   . ASTHMA   . Asthma    as a child  . Balanitis    +severe phimosis+ buried penis->circ not possible but dorsal slit done 10/2019  . BPH (benign prostatic hypertrophy)    with urinary retention.  Renal u/s 12/04/19 NORMAL KIDNEYS, NORMAL BLADDER, NO HYDRONEPHROSIS  . CAD (coronary artery disease)    Nonobstructive by 12/2012 cath; then 03/2016 he required BMS to RCA ([facility]).  In-stent restenosis on  cath 11/01/16, baloon angioplasty successful--pt to take plavix (no ASA), + eliquis (for PAF).  [name] CATARACT, HX OF   . Chronic combined systolic and diastolic CHF (congestive heart failure) ([place]) 05/2016   Ischemic CM.  04/2018 EF 40-45%, grd I DD.  [name] Chronic pain syndrome    Lumbar DDD; chronic neuropathic pain (DM); has spinal stimulator and sees pain mgmt MD  . Complicated UTI (urinary tract infection) 09/2019   Phimoses, acute urinary retention, entoerococcus UTI, enterococc bacteremia.  F/u blood  clx's neg x 5d.    [name] COPD   . Diabetes mellitus type 2 with complications (HCC)    NAT5T jumped from 5.7% to 6.1% 03/2015---started metformin at that time.  DM 2 dx by fasting gluc criteria 2018.  Has chronic neuropath pain  . Elevated transaminase level 02/2016   Suspect due to fatty liver (documented on u/s 2007). Plan repeat labs 03/2016.  [name] Enterococcal bacteremia 09/2019   Phimoses, acute urinary retention, entoerococcus UTI, enterococc bacteremia.  F/u blood clx's neg x 5d.    . Essential hypertension, benign   . Fatty liver 2007   2007 u/s showed fatty liver with hepatosplenomegaly.  2019 repeat u/s->fatty liver but no cirrhosis or hepatosplenomegaly.  . FUO (fever of unknown origin) 11/26/2019  . GERD (gastroesophageal reflux disease)   . Glaucoma   . GOUT   . Hallux valgus (acquired)   . HH (hiatus hernia)   . HYPERCHOLESTEROLEMIA-PURE   . Hypogonadism male   . ICD (implantable cardioverter-defibrillator) infection ([place]) 12/22/2019  . 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 Tommy Medal)  . Lumbosacral neuritis   . Lumbosacral spondylosis    Lumbar spinal stenosis with neurogenic claudication--contributes to his chronic pain syndrome  . Normal memory function 08/2014   Neuropsychological testing ([facility]): no cognitive impairment or sign of neurodegenerative disorder.  Likely has adjustment d/o with mixed anxiety/depressed features and may benefit from low dose SNRI.    [name] Normocytic anemia 03/2016   Mild-pt needs ferritin and vit B12 level checked (as of 03/22/16). Hb stable 09/2019  . NSTEMI (non-ST elevated myocardial infarction) ([place]) 03/20/2016   BMS to RCA  . Obesity hypoventilation syndrome ([place])   . Obesity, unspecified   . Orthostatic hypotension   . OSA on CPAP    8 cm H2O  . OSTEOARTHRITIS   . Paroxysmal atrial fibrillation ([place]) 2003    (? chronic?) Off  anticoag for a while due to falls.  Then apixaban started 12/2014.  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 Ella Bodo): neuropathic pain->diff to treat, failed nucynta, failed spinal stimul trial, oxycontin hs + tramadol + gabap as of 12/2017 f/u Dr. Letta Pate.  . Personal history of colonic adenoma 10/30/2012   Diminutive adenoma, consider repeat 2019 per GI  . PFO (patent foramen ovale) 09/2019   small, with predominately L to R shunt  . Pneumonia   . Presence of cardiac defibrillator 11/07/2017  . Primary osteoarthritis of both knees    Bone on bone of medial compartments, + signif patellofemoral arth bilat.--supartz inj series started 09/12/17  . 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 PUD (peptic ulcer disease)   . PULMONARY HYPERTENSION, HX OF   . Secondary male hypogonadism 2017  . Sepsis ([place]) 04/29/2020  . 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  Shortness of breath    with exertion: much improved s/p TAVR and treatment for CHF.  . Sick sinus syndrome (HCC)    PPM placed  . Thrombocytopenia ([place]) 2018   HSM on 2007 abd u/s---suspect some mild splenic sequestration chronically.  [name] Unspecified glaucoma(365.9)   . Unspecified hereditary and idiopathic peripheral neuropathy approx age 88   bilat LE's, ? left arm, too.  Feet became progressively numb + left foot pain intermittently.  Pt may be trying a spinal stimulator (as of 05/2015)  . VENOUS INSUFFICIENCY    Being followed by Dr. Sharol Given as of 10/2016 for two R LL venous stasis ulcers/skin tears.  Healed as of 10/30/16 f/u with Dr. Sharol Given.  . VENTRAL HERNIA   . Wound dehiscence 10/22/2019    Past Surgical History:  Procedure Laterality Date  . AMPUTATION Left 04/11/2013   Procedure: AMPUTATION DIGIT Left 3rd toe;  Surgeon: [name], MD;  Location: [place];  Service: Orthopedics;  Laterality: Left;  Left 3rd toe amputation  at [facility]  . AMPUTATION Left 08/29/2019   Procedure: LEFT TRANSMETATARSAL AMPUTATION;  Surgeon: [name], MD;  Location: [place];  Service: Orthopedics;  Laterality: Left;  . BIOPSY  05/04/2020   Procedure: BIOPSY;  Surgeon: Jackquline Denmark, MD;  Location: [name] ENDOSCOPY;  Service: Endoscopy;;  . [place]; 03/10/16   1997 Non-obstructive disease.  03/2016 BMS to RCA, with 25% pDiag dz, o/w normal cors per cath 03/07/16.  Cath 11/01/16: in stent restenosis, successful baloon angioplasty.  [name] CARDIAC CATHETERIZATION  12/24/2012   mild < 20% LCx, prox 30% RCA; LVEF 55-65% , moderate pulmonary HTN, moderate AS  . CARDIAC DEFIBRILLATOR PLACEMENT  11/07/2017   [facility] MRI Quad CRT defibrillator  . CARDIOVASCULAR STRESS TEST  05/11/16 ([facility])   2017 Myocardial perfusion imaging:  No ischemia; scar in apex, global hypokinesis, EF 36%.  06/15/20->mod primarily fixed inferolat wall defect mildly worse with stress c/w infarct/scar with mild peri-infarct ischemia, normal EF-->for cath per cards.  . Carotid dopplers  03/09/2016   [facility]: no hemodynamically significant stenosis on either side.  . CHOLECYSTECTOMY    . COLONOSCOPY [name] 10/30/2012   Procedure: COLONOSCOPY;  Surgeon: [name], MD;  Location: [facility] ENDOSCOPY;  Service: Endoscopy;  Laterality: [name];  . CORONARY ANGIOPLASTY WITH STENT PLACEMENT  03/2016; 04/2017   2017-[facility]: BMS to RCA-pt was placed on Brilinta.  04/2017: DES to RCA.  . DORSAL SLIT [name] 10/29/2019   for severe phimosis. Procedure: DORSAL SLIT;  Surgeon: Alexis Frock, MD;  Location: [facility] ORS;  Service: Urology;  Laterality: [name];  64 MINS  . ESOPHAGEAL DILATION  05/04/2020   Procedure: ESOPHAGEAL DILATION;  Surgeon: Jackquline Denmark, MD;  Location: [facility] ENDOSCOPY;  Service: Endoscopy;;  . ESOPHAGOGASTRODUODENOSCOPY (EGD) WITH PROPOFOL [name] 05/04/2020   Procedure: ESOPHAGOGASTRODUODENOSCOPY (EGD) WITH PROPOFOL;  Surgeon: Jackquline Denmark, MD;  Location[facility] ENDOSCOPY;  Service:  Endoscopy;  Laterality: [name];  . EYE SURGERY Bilateral cataract  . HEMORRHOID SURGERY    . INTRAOCULAR LENS INSERTION Bilateral   . KNEE SURGERY Right   . LEFT AND RIGHT HEART CATHETERIZATION WITH CORONARY ANGIOGRAM [name] 12/24/2012   Procedure: LEFT AND RIGHT HEART CATHETERIZATION WITH CORONARY ANGIOGRAM;  Surgeon: [name], MD;  Location: [facility] CATH LAB;  Service: Cardiovascular;  Laterality: [name];  . LEG SURGERY Bilateral    lenghtening   . PACEMAKER PLACEMENT  04/13/2016   2nd deg HB after TAVR, pt had DC MDT PPM placed.  [name] SHOULDER ARTHROSCOPY  08/30/2011   Procedure: ARTHROSCOPY SHOULDER;  Surgeon: [name], MD;  Location: [place];  Service: Orthopedics;  Laterality: Right;  Right Shoulder Arthroscopy, Debridement, and Decompression  . SPINAL CORD STIMULATOR INSERTION [name] 09/10/2015   Procedure: LUMBAR SPINAL CORD STIMULATOR INSERTION;  Surgeon: [name], MD;  Location: [place] NEURO ORS;  Service: Neurosurgery;  Laterality: [name];  . TEE WITHOUT CARDIOVERSION [name] 09/16/2019   Procedure: TRANSESOPHAGEAL ECHOCARDIOGRAM (TEE);  Surgeon: Skeet Latch, MD;  Location: [place];  Service: Cardiovascular;  Laterality: [name];  . TEE WITHOUT CARDIOVERSION [name] 12/02/2019   +vegetation on AVR and pacer lead in RV.  EF 55-60%, normal wall motion.  Valves function normal.  Procedure: TRANSESOPHAGEAL ECHOCARDIOGRAM (TEE);  Surgeon: Geralynn Rile, MD;  Location: [place];  Service: Cardiovascular;  Laterality: [name];  . 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  R big toe surg due to osteoarth  . TONSILLECTOMY    . traeculectomy Left    eye  . TRANSCATHETER AORTIC VALVE REPLACEMENT, TRANSFEMORAL  04/11/2016  . TRANSESOPHAGEAL ECHOCARDIOGRAM  03/09/2016; 09/2019   [facility]: EF 55-60%, PFO seen with bi-directional shunting, no thrombus in appendage.  09/2019 ->no valvular vegetations. Small patent foramen ovale with predominantly left to right shunting across the interatrial septum.  .  TRANSTHORACIC ECHOCARDIOGRAM  01/2015; 01/2016; 05/18/16; 09/18/16, 05/2017, 08/2017   01/2015 No signif change in aortic stenosis (moderate).  01/2016 Severe LVH w/small LV cavity, EF 60-65%, grade I diast dysfxn.  05/2016 (s/p TAVR): EF 50-55%, grd I DD, biopros AV good.  08/2016--EF 50-55%, LV septal motion c/w conduction abnl, grd I DD,mild MS,bioprosth aortic valve well seated, w/trace AR. 05/2017 TTE EF 35%. 08/2017-EF 35%, mod diff hypokin LV, grd I DD, biopros AV good.   . TRANSTHORACIC ECHOCARDIOGRAM  04/2018; 09/2019   04/2018: EF 40-45%, mod diffuse LV hypokinesis, grd I DD, bioprosth AV well seated, no AS or AR. 09/2019 EF 60-65%, grd I DD, valves fine, including bioprosth AV  . VITRECTOMY      Family History  Problem Relation Age of Onset  . Hypertension Mother   . Coronary artery disease Mother   . Heart attack Mother   . Neuropathy Mother   . Pulmonary fibrosis Father        asbestosis      Social History   Socioeconomic History  . Marital status: Married    Spouse name: Not on file  . Number of children: 0  . Years of education: 65  . Highest education level: Not on file  Occupational History  . Occupation: Engineer, production    Comment: retired  Tobacco Use  . Smoking status: Never Smoker  . Smokeless tobacco: Never Used  Vaping Use  . Vaping Use: Never used  Substance and Sexual Activity  . Alcohol use: No    Alcohol/week: 0.0 standard drinks  . Drug use: No    Types: Oxycodone  . Sexual activity: Not Currently  Other Topics Concern  . Not on file  Social History Narrative   HSG, [name] - BS, [name] - MS-engineering, 2 years on PhD - [place]. Married - '65 - 71yr/divorced; '76- 3 yrs/divorced; '92 . No children. 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 ACP/Living Will - Yes CPR; long-term Mechanical ventilation as long as he was able to cognate; ok for long term artificial nutrition. Precondition being able to cognate and  not to have too much pain.  Social Determinants of Health   Financial Resource Strain: Not on file  Food Insecurity: Not on file  Transportation Needs: Not on file  Physical Activity: Not on file  Stress: Not on file  Social Connections: Not on file    Allergies  Allergen Reactions  . Brimonidine Tartrate Shortness Of Breath    Alphagan-Shortness of breath  . Brinzolamide Shortness Of Breath    AZOPT- Shortness of breath  . Latanoprost Shortness Of Breath    XALATAN- Shortness of breath  . Nucynta [Tapentadol] Shortness Of Breath  . Sulfa Antibiotics Palpitations  . Timolol Maleate Shortness Of Breath and Other (See Comments)    TIMOPTIC- Aggravated asthma  . Diltiazem Swelling     leg swelling  . Rofecoxib Swelling     VIOXX- leg swelling  . Vancomycin Hives and Other (See Comments)    Possible "Red Man Syndrome"? > hives/blisters  . Codeine Other (See Comments)    Childhood reaction  . Tamsulosin Other (See Comments)    Dizziness   . Celecoxib Other (See Comments)    CELLBREX-confusion  . Colchicine Diarrhea    diarrhea  . Tape Rash     Current Outpatient Medications:  .  albuterol (PROVENTIL HFA;VENTOLIN HFA) 108 (90 BASE) MCG/ACT inhaler, Inhale 2 puffs into the lungs 4 (four) times daily as needed for wheezing or shortness of breath., Disp: , Rfl:  .  allopurinol (ZYLOPRIM) 300 MG tablet, TAKE 1 TABLET BY MOUTH ONCE DAILY WITH FOOD, Disp: 90 tablet, Rfl: 1 .  amoxicillin (AMOXIL) 500 MG tablet, Take 2 tablets (1,000 mg total) by mouth 2 (two) times daily., Disp: 120 tablet, Rfl: 11 .  Ampicillin Sodium 10 g SOLR, Take 500 mg by mouth in the morning and at bedtime. (Patient not taking: Reported on 06/22/2020), Disp: , Rfl:  .  apixaban (ELIQUIS) 5 MG TABS tablet, Take 5 mg by mouth 2 (two) times daily., Disp: , Rfl:  .  ASPIRIN LOW DOSE 81 MG EC tablet, Take 81 mg by mouth daily., Disp: , Rfl:  .  B Complex-C (B-COMPLEX WITH VITAMIN C) tablet, Take 1 tablet by  mouth daily. Take 1 tablet daily, 222m, Disp: , Rfl:  .  ezetimibe (ZETIA) 10 MG tablet, Take 1 tablet (10 mg total) by mouth daily., Disp: 30 tablet, Rfl: 1 .  finasteride (PROSCAR) 5 MG tablet, TAKE 1 TABLET BY MOUTH EVERY DAY, Disp: 30 tablet, Rfl: 0 .  fluticasone (FLONASE) 50 MCG/ACT nasal spray, Place 2 sprays into both nostrils as needed for allergies or rhinitis., Disp: , Rfl:  .  gabapentin (NEURONTIN) 800 MG tablet, TAKE 1 TABLET (800 MG TOTAL) BY MOUTH 4 (FOUR) TIMES DAILY., Disp: 360 tablet, Rfl: 2 .  KLOR-CON M20 20 MEQ tablet, TAKE 1 TABLET BY MOUTH EVERY DAY, Disp: 30 tablet, Rfl: 0 .  metoprolol succinate (TOPROL-XL) 25 MG 24 hr tablet, Take 25 mg by mouth daily., Disp: , Rfl:  .  mometasone-formoterol (DULERA) 100-5 MCG/ACT AERO, Inhale 2 puffs into the lungs 2 (two) times daily for 60 doses., Disp: 1 each, Rfl: 0 .  Multiple Vitamin (MULTIVITAMIN ADULT PO), Take by mouth daily., Disp: , Rfl:  .  niacin (NIASPAN) 1000 MG CR tablet, TAKE 1 TABLET BY MOUTH TWICE A DAY, Disp: 180 tablet, Rfl: 0 .  nitroGLYCERIN (NITROSTAT) 0.4 MG SL tablet, Place 0.4 mg under the tongue every 5 (five) minutes as needed for chest pain. , Disp: , Rfl:  .  pantoprazole (PROTONIX) 40 MG tablet,  TAKE 1 TABLET BY MOUTH EVERY DAY, Disp: 90 tablet, Rfl: 3 .  Respiratory Therapy Supplies MISC, Inhale into the lungs., Disp: , Rfl:  .  rosuvastatin (CRESTOR) 40 MG tablet, take 1 tablet by mouth once daily (Patient taking differently: Take 40 mg by mouth daily.), Disp: 15 tablet, Rfl: 0 .  sacubitril-valsartan (ENTRESTO) 24-26 MG, Take 1 tablet by mouth 2 (two) times daily. , Disp: , Rfl:  .  spironolactone (ALDACTONE) 25 MG tablet, Take 0.5 tablets (12.5 mg total) by mouth daily., Disp: 15 tablet, Rfl: 0 .  torsemide (DEMADEX) 20 MG tablet, Take 1 tablet (20 mg total) by mouth in the morning and at bedtime., Disp: 60 tablet, Rfl: 0 .  vitamin C (ASCORBIC ACID) 250 MG tablet, Take 250 mg by mouth daily., Disp: ,  Rfl:  .  VITAMIN D, CHOLECALCIFEROL, PO, Take 1 tablet by mouth daily. , Disp: , Rfl:  .  WIXELA INHUB 250-50 MCG/DOSE AEPB, TAKE 1 PUFF BY MOUTH TWICE A DAY, Disp: 60 each, Rfl: 11   Review of Systems  Constitutional: Negative for activity change, appetite change, chills, diaphoresis, fatigue, fever and unexpected weight change.  HENT: Positive for rhinorrhea. Negative for congestion, sinus pressure, sneezing, sore throat and trouble swallowing.   Eyes: Negative for photophobia and visual disturbance.  Respiratory: Negative for cough, chest tightness, shortness of breath, wheezing and stridor.   Cardiovascular: Negative for chest pain, palpitations and leg swelling.  Gastrointestinal: Negative for abdominal distention, abdominal pain, anal bleeding, blood in stool, constipation, diarrhea, nausea and vomiting.  Genitourinary: Negative for difficulty urinating, dysuria, flank pain and hematuria.  Musculoskeletal: Negative for arthralgias, back pain, gait problem, joint swelling and myalgias.  Skin: Positive for wound. Negative for color change, pallor and rash.  Neurological: Negative for dizziness, tremors, weakness and light-headedness.  Hematological: Negative for adenopathy. Does not bruise/bleed easily.  Psychiatric/Behavioral: Negative for agitation, behavioral problems, confusion, decreased concentration, dysphoric mood and sleep disturbance.       Objective:   Physical Exam Constitutional:      General: He is not in acute distress.    Appearance: Normal appearance. He is well-developed. He is not ill-appearing or diaphoretic.  HENT:     Head: Normocephalic and atraumatic.     Right Ear: Hearing and external ear normal.     Left Ear: Hearing and external ear normal.     Nose: No nasal deformity or rhinorrhea.  Eyes:     General: No scleral icterus.    Conjunctiva/sclera: Conjunctivae normal.     Right eye: Right conjunctiva is not injected.     Left eye: Left conjunctiva is  not injected.     Pupils: Pupils are equal, round, and reactive to light.  Neck:     Vascular: No JVD.  Cardiovascular:     Rate and Rhythm: Normal rate and regular rhythm.     Heart sounds: S1 normal and S2 normal. No murmur heard. No friction rub. No gallop.   Pulmonary:     Effort: Pulmonary effort is normal. No respiratory distress.     Breath sounds: No stridor. No rhonchi.  Abdominal:     General: There is no distension.     Palpations: Abdomen is soft.  Musculoskeletal:        General: Normal range of motion.     Right shoulder: Normal.     Left shoulder: Normal.     Cervical back: Normal range of motion and neck supple.     Right  hip: Normal.     Left hip: Normal.     Right knee: Normal.     Left knee: Normal.  Lymphadenopathy:     Head:     Right side of head: No submandibular, preauricular or posterior auricular adenopathy.     Left side of head: No submandibular, preauricular or posterior auricular adenopathy.     Cervical: No cervical adenopathy.     Right cervical: No superficial or deep cervical adenopathy.    Left cervical: No superficial or deep cervical adenopathy.  Skin:    General: Skin is warm and dry.     Coloration: Skin is not pale.     Findings: No abrasion, bruising, ecchymosis, erythema, lesion or rash.     Nails: There is no clubbing.  Neurological:     General: No focal deficit present.     Mental Status: He is alert and oriented to person, place, and time.     Sensory: No sensory deficit.     Coordination: Coordination normal.     Gait: Gait normal.  Psychiatric:        Attention and Perception: He is attentive.        Mood and Affect: Mood normal.        Speech: Speech normal.        Behavior: Behavior normal. Behavior is cooperative.        Thought Content: Thought content normal.        Judgment: Judgment normal.       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  However it is not a trivial decision for the patient.  He suffers from a lot of comorbidities he is concerned whether or not he will build to get around well at all with an amputation.  In particular he says he is suffering from chronic back pain that has continued to bother him when he sleeps and when he tries to move about.   He is going to reschedule cardiac catheterization and also schedule another appointment with Dr. Due to disc discussed different surgical options.  Ound very carefully.  Rhinitis: The certainly could have been a manifestation of Omicron but at this point int time testing will not change our management and we have limited supply of rapid ag.

## 2020-07-15 LAB — BASIC METABOLIC PANEL WITH GFR
BUN: 25 mg/dL (ref 7–25)
CO2: 28 mmol/L (ref 20–32)
Calcium: 9 mg/dL (ref 8.6–10.3)
Chloride: 106 mmol/L (ref 98–110)
Creat: 0.81 mg/dL (ref 0.70–1.11)
GFR, Est African American: 97 mL/min/{1.73_m2} (ref >=60)
GFR, Est Non African American: 84 mL/min/{1.73_m2} (ref >=60)
Glucose, Bld: 119 mg/dL — ABNORMAL HIGH (ref 65–99)
Potassium: 4.5 mmol/L (ref 3.5–5.3)
Sodium: 141 mmol/L (ref 135–146)

## 2020-07-15 LAB — CBC WITH DIFFERENTIAL/PLATELET
Absolute Monocytes: 821 cells/uL (ref 200–950)
Basophils Absolute: 72 cells/uL (ref 0–200)
Basophils Relative: 1 %
Eosinophils Absolute: 223 cells/uL (ref 15–500)
Eosinophils Relative: 3.1 %
HCT: 36.7 % — ABNORMAL LOW (ref 38.5–50.0)
Hemoglobin: 12.2 g/dL — ABNORMAL LOW (ref 13.2–17.1)
Lymphs Abs: 1390 cells/uL (ref 850–3900)
MCH: 29.7 pg (ref 27.0–33.0)
MCHC: 33.2 g/dL (ref 32.0–36.0)
MCV: 89.3 fL (ref 80.0–100.0)
MPV: 10.6 fL (ref 7.5–12.5)
Monocytes Relative: 11.4 %
Neutro Abs: 4694 cells/uL (ref 1500–7800)
Neutrophils Relative %: 65.2 %
Platelets: 121 10*3/uL — ABNORMAL LOW (ref 140–400)
RBC: 4.11 10*6/uL — ABNORMAL LOW (ref 4.20–5.80)
RDW: 14 % (ref 11.0–15.0)
Total Lymphocyte: 19.3 %
WBC: 7.2 10*3/uL (ref 3.8–10.8)

## 2020-07-15 LAB — SEDIMENTATION RATE: Sed Rate: 38 mm/h — ABNORMAL HIGH (ref 0–20)

## 2020-07-15 LAB — C-REACTIVE PROTEIN: CRP: 0.9 mg/L (ref <8.0)

## 2020-07-26 ENCOUNTER — Ambulatory Visit (INDEPENDENT_AMBULATORY_CARE_PROVIDER_SITE_OTHER): Payer: Medicare Other | Admitting: Orthopedic Surgery

## 2020-07-26 ENCOUNTER — Encounter: Payer: Self-pay | Admitting: Orthopedic Surgery

## 2020-07-26 DIAGNOSIS — T8781 Dehiscence of amputation stump: Secondary | ICD-10-CM

## 2020-07-26 DIAGNOSIS — Z89432 Acquired absence of left foot: Secondary | ICD-10-CM | POA: Diagnosis not present

## 2020-07-26 NOTE — Progress Notes (Signed)
Office Visit Note   Patient: Tim [name]           Date of Birth: Feb 27, 1941           MRN: [id number] Visit Date: 07/26/2020              Requested by: Tammi Sou, MD [address] [place],  [place] [address] PCP: Tammi Sou, MD  Chief Complaint  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  Patient states he was supposed to have a catheterization with his cardiologist in [place] he states he canceled this due to respiratory symptoms.  Assessment & Plan: Visit Diagnoses:  1. History of transmetatarsal amputation of left foot ([place])   2. Dehiscence of amputation stump ([place])     Plan: We will obtain a CT scan of the left foot to evaluate for possible osteomyelitis.  Patient does have a pacemaker and cannot undergo a MRI scan.  Follow-up after the CT scan.  Follow-Up Instructions: Return if symptoms worsen or fail to improve.   Ortho Exam  [name], oriented, no adenopathy, well-dressed, normal affect, normal respiratory effort. Examination patient's venous stasis swelling is stable he does have increased swelling and redness across the forefoot.  He has a large ulcer over the mid aspect of the transmetatarsal amputation.  After informed consent a 10 blade knife was used to debride the skin and soft tissue back to healthy viable granulation tissue this was touched with silver nitrate there is no exposed bone or tendon.  The ulcer is 3 cm in diameter and 5 mm deep.  Prior to debridement the ulcer was 1 cm in diameter.  Imaging: No results found. No images are attached to the encounter.  Labs: Lab Results  Component Value Date   HGBA1C 6.1 (H) 04/29/2020   HGBA1C 5.6 10/02/2019   HGBA1C 6.1 03/27/2019    ESRSEDRATE 38 (H) 07/14/2020   ESRSEDRATE 20 02/09/2016   ESRSEDRATE 36 (H) 01/28/2014   CRP 0.9 07/14/2020   CRP 0.7 05/03/2020   CRP 1.0 (H) 05/02/2020   LABURIC 3.5 (L) 09/10/2013   REPTSTATUS 05/04/2020 FINAL 04/28/2020   CULT  04/28/2020    NO GROWTH 5 DAYS Performed at [facility], [place] [address], [place], [place] [address]    LABORGA ENTEROCOCCUS FAECALIS 09/11/2019     Lab Results  Component Value Date   ALBUMIN 3.0 (L) 05/07/2020   ALBUMIN 2.9 (L) 05/06/2020   ALBUMIN 3.0 (L) 05/05/2020   LABURIC 3.5 (L) 09/10/2013    Lab Results  Component Value Date   MG 1.7 05/07/2020   MG 1.6 (L) 05/06/2020   MG 1.8 05/05/2020   No results found for: VD25OH  No results found for: PREALBUMIN CBC EXTENDED Latest Ref Rng & Units 07/14/2020 05/07/2020 05/06/2020  WBC 3.8 - 10.8 Thousand/uL 7.2 5.5 5.6  RBC 4.20 - 5.80 Million/uL 4.11(L) 3.54(L) 3.41(L)  HGB 13.2 - 17.1 g/dL 12.2(L) 10.2(L) 10.1(L)  HCT 38.5 - 50.0 % 36.7(L) 32.1(L) 31.0(L)  PLT 140 - 400 Thousand/uL 121(L) 133(L) 129(L)  NEUTROABS 1,500 - 7,800 cells/uL 4,694 3.0 3.2  LYMPHSABS 850 - 3,900 cells/uL 1,390 1.6 1.6     There is no  height or weight on file to calculate BMI.  Orders:  No orders of the defined types were placed in this encounter.  No orders of the defined types were placed in this encounter.    Procedures: No procedures performed  Clinical Data: No additional findings.  ROS:  All other systems negative, except as noted in the HPI. Review of Systems  Objective: Vital Signs: There were no vitals taken for this visit.  Specialty Comments:  No specialty comments available.  PMFS History: Patient Active Problem List   Diagnosis Date Noted  . Rhinitis 07/14/2020  . Non-pressure chronic ulcer of other part of left foot limited to breakdown of skin ([place])   . Esophageal dysphagia   . Bacteremia   . Endocarditis of mitral valve   . Fever 04/28/2020  . Cervical  lymphadenopathy 03/15/2020  . 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 ICD (implantable cardioverter-defibrillator) infection ([place]) 12/22/2019  . Gram-positive bacteremia 11/27/2019  . FUO (fever of unknown origin) 11/26/2019  . Low blood pressure reading 11/05/2019  . Wound dehiscence 10/22/2019  . PFO (patent foramen ovale) 10/06/2019  . ICD (implantable cardioverter-defibrillator) battery depletion   . Pressure injury of skin 09/13/2019  . Enterococcal bacteremia history  09/13/2019  . 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  . 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 Urinary retention 09/13/2019  . Phimosis 09/13/2019  . SIRS (systemic inflammatory response syndrome) ([place]) 09/12/2019  . Sepsis ([place]) 09/12/2019  . Enterococcus UTI 09/12/2019  . Cellulitis of foot   . History of transmetatarsal amputation of left foot ([place])   . Subacute osteomyelitis, left ankle and foot ([place])   . 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 ([place]) 04/17/2018  . Diabetic neuropathy, painful ([place]) 12/26/2016  . Neuropathic pain of foot, left 06/20/2016  . 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 Status post amputation of left great toe ([place]) 06/08/2015  . Spinal stenosis of lumbar region 02/16/2015  . Hereditary and idiopathic peripheral neuropathy 01/12/2015  . Oral thrush 12/28/2014  . Orthostatic dizziness 05/24/2014  . Paresthesias with subjective weakness 01/30/2014  . Bilateral thigh pain 01/30/2014  . Abdominal wall pain in right upper quadrant 01/22/2014  . Gross hematuria 09/25/2013  . Intertrigo 09/25/2013  . IBS (irritable bowel syndrome) 09/11/2013  . Chronic pain syndrome 08/06/2013  . CAD (coronary artery disease), native coronary artery 12/25/2012  . Paroxysmal atrial fibrillation ([place]) 12/25/2012  . Moderate aortic stenosis 12/25/2012  . Aortic stenosis 12/24/2012  . Personal  history of colonic adenoma 10/30/2012  . Diverticulosis of colon (without mention of hemorrhage) 10/30/2012  . Internal hemorrhoids 10/30/2012  . Change in bowel habits intermittent loose stools 10/30/2012  . Routine health maintenance 06/09/2012  . Hereditary peripheral neuropathy 10/10/2011  . Long term current use of anticoagulant - apixiban 08/16/2010  . Dyspnea on exertion 09/08/2009  . HYPERCHOLESTEROLEMIA-PURE 01/13/2009  . Essential hypertension, benign 01/13/2009  . EDEMA 01/13/2009  . GOUT 01/12/2009  . GLAUCOMA 01/12/2009  . Venous insufficiency (chronic) (peripheral) 01/12/2009  . COPD (chronic obstructive pulmonary disease) ([place]) 01/12/2009  . VENTRAL HERNIA 01/12/2009  . OSTEOARTHRITIS 01/12/2009  . ARTHRITIS 01/12/2009  . DEGENERATIVE DISC DISEASE 01/12/2009  . CATARACT, HX OF 01/12/2009  . HEART MURMUR, HX OF 01/12/2009  . BENIGN PROSTATIC HYPERTROPHY, HX OF 01/12/2009  . Obesity 01/17/2007  . OBSTRUCTIVE SLEEP APNEA 01/17/2007  . ASTHMA 01/17/2007  . GERD 01/17/2007  . HALLUX VALGUS, ACQUIRED 01/17/2007  . Pulmonary hypertension ([place])  01/17/2007   Past Medical History:  Diagnosis Date  . AICD (automatic cardioverter/defibrillator) present   . ASTHMA   . Asthma    as a child  . Balanitis    +severe phimosis+ buried penis->circ not possible but dorsal slit done 10/2019  . BPH (benign prostatic hypertrophy)    with urinary retention.  Renal u/s 12/04/19 NORMAL KIDNEYS, NORMAL BLADDER, NO HYDRONEPHROSIS  . CAD (coronary artery disease)    Nonobstructive by 12/2012 cath; then 03/2016 he required BMS to RCA ([facility]).  In-stent restenosis on cath 11/01/16, baloon angioplasty successful--pt to take plavix (no ASA), + eliquis (for PAF).  [name] CATARACT, HX OF   . Chronic combined systolic and diastolic CHF (congestive heart failure) ([place]) 05/2016   Ischemic CM.  04/2018 EF 40-45%, grd I DD.  [name] Chronic pain syndrome    Lumbar DDD; chronic neuropathic pain (DM); has spinal  stimulator and sees pain mgmt MD  . Complicated UTI (urinary tract infection) 09/2019   Phimoses, acute urinary retention, entoerococcus UTI, enterococc bacteremia.  F/u blood clx's neg x 5d.    [name] COPD   . Diabetes mellitus type 2 with complications (HCC)    [id number] jumped from 5.7% to 6.1% 03/2015---started metformin at that time.  DM 2 dx by fasting gluc criteria 2018.  Has chronic neuropath pain  . Elevated transaminase level 02/2016   Suspect due to fatty liver (documented on u/s 2007). Plan repeat labs 03/2016.  [name] Enterococcal bacteremia 09/2019   Phimoses, acute urinary retention, entoerococcus UTI, enterococc bacteremia.  F/u blood clx's neg x 5d.    . Essential hypertension, benign   . Fatty liver 2007   2007 u/s showed fatty liver with hepatosplenomegaly.  2019 repeat u/s->fatty liver but no cirrhosis or hepatosplenomegaly.  . FUO (fever of unknown origin) 11/26/2019  . GERD (gastroesophageal reflux disease)   . Glaucoma   . GOUT   . Hallux valgus (acquired)   . HH (hiatus hernia)   . HYPERCHOLESTEROLEMIA-PURE   . Hypogonadism male   . ICD (implantable cardioverter-defibrillator) infection ([place]) 12/22/2019  . 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 Tommy Medal)  . Lumbosacral neuritis   . Lumbosacral spondylosis    Lumbar spinal stenosis with neurogenic claudication--contributes to his chronic pain syndrome  . Normal memory function 08/2014   Neuropsychological testing ([facility]): no cognitive impairment or sign of neurodegenerative disorder.  Likely has adjustment d/o with mixed anxiety/depressed features and may benefit from low dose SNRI.    [name] Normocytic anemia 03/2016   Mild-pt needs ferritin and vit B12 level checked (as of 03/22/16). Hb stable 09/2019  . NSTEMI (non-ST elevated myocardial infarction) ([place]) 03/20/2016   BMS to RCA  . Obesity hypoventilation syndrome ([place])    . Obesity, unspecified   . Orthostatic hypotension   . OSA on CPAP    8 cm H2O  . OSTEOARTHRITIS   . Paroxysmal atrial fibrillation ([place]) 2003    (? chronic?) Off anticoag for a while due to falls.  Then apixaban started 12/2014.  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 Ella Bodo): neuropathic pain->diff to treat, failed nucynta, failed spinal stimul trial, oxycontin hs + tramadol + gabap as of 12/2017 f/u Dr. Letta Pate.  . Personal history of colonic adenoma 10/30/2012   Diminutive adenoma, consider repeat 2019 per GI  . PFO (patent foramen ovale) 09/2019   small, with predominately L  to R shunt  . Pneumonia   . Presence of cardiac defibrillator 11/07/2017  . Primary osteoarthritis of both knees    Bone on bone of medial compartments, + signif patellofemoral arth bilat.--supartz inj series started 09/12/17  . 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 PUD (peptic ulcer disease)   . PULMONARY HYPERTENSION, HX OF   . Rhinitis 07/14/2020  . Secondary male hypogonadism 2017  . Sepsis ([place]) 04/29/2020  . 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 Shortness of breath    with exertion: much improved s/p TAVR and treatment for CHF.  . Sick sinus syndrome (HCC)    PPM placed  . Thrombocytopenia ([place]) 2018   HSM on 2007 abd u/s---suspect some mild splenic sequestration chronically.  [name] Unspecified glaucoma(365.9)   . Unspecified hereditary and idiopathic peripheral neuropathy approx age 73   bilat LE's, ? left arm, too.  Feet became progressively numb + left foot pain intermittently.  Pt may be trying a spinal stimulator (as of 05/2015)  . VENOUS INSUFFICIENCY    Being followed by Dr. Sharol Given as of 10/2016 for two R LL venous stasis ulcers/skin tears.  Healed as of 10/30/16 f/u with Dr. Sharol Given.  . VENTRAL HERNIA   . Wound dehiscence 10/22/2019    Family History  Problem Relation Age of Onset   . Hypertension Mother   . Coronary artery disease Mother   . Heart attack Mother   . Neuropathy Mother   . Pulmonary fibrosis Father        asbestosis    Past Surgical History:  Procedure Laterality Date  . AMPUTATION Left 04/11/2013   Procedure: AMPUTATION DIGIT Left 3rd toe;  Surgeon: [name], MD;  Location: [place];  Service: Orthopedics;  Laterality: Left;  Left 3rd toe amputation at [facility]  . AMPUTATION Left 08/29/2019   Procedure: LEFT TRANSMETATARSAL AMPUTATION;  Surgeon: [name], MD;  Location: [place];  Service: Orthopedics;  Laterality: Left;  . BIOPSY  05/04/2020   Procedure: BIOPSY;  Surgeon: Jackquline Denmark, MD;  Location: [facility] ENDOSCOPY;  Service: Endoscopy;;  . [place]; 03/10/16   1997 Non-obstructive disease.  03/2016 BMS to RCA, with 25% pDiag dz, o/w normal cors per cath 03/07/16.  Cath 11/01/16: in stent restenosis, successful baloon angioplasty.  [name] CARDIAC CATHETERIZATION  12/24/2012   mild < 20% LCx, prox 30% RCA; LVEF 55-65% , moderate pulmonary HTN, moderate AS  . CARDIAC DEFIBRILLATOR PLACEMENT  11/07/2017   [facility] MRI Quad CRT defibrillator  . CARDIOVASCULAR STRESS TEST  05/11/16 ([facility])   2017 Myocardial perfusion imaging:  No ischemia; scar in apex, global hypokinesis, EF 36%.  06/15/20->mod primarily fixed inferolat wall defect mildly worse with stress c/w infarct/scar with mild peri-infarct ischemia, normal EF-->for cath per cards.  . Carotid dopplers  03/09/2016   [facility]: no hemodynamically significant stenosis on either side.  . CHOLECYSTECTOMY    . COLONOSCOPY [name] 10/30/2012   Procedure: COLONOSCOPY;  Surgeon: [name], MD;  Location: [facility] ENDOSCOPY;  Service: Endoscopy;  Laterality: [name];  . CORONARY ANGIOPLASTY WITH STENT PLACEMENT  03/2016; 04/2017   2017-[facility]: BMS to RCA-pt was placed on Brilinta.  04/2017: DES to RCA.  . DORSAL SLIT [name] 10/29/2019   for severe phimosis. Procedure: DORSAL SLIT;  Surgeon: Alexis Frock, MD;   Location: [facility] ORS;  Service: Urology;  Laterality: [name];  41 MINS  . ESOPHAGEAL DILATION  05/04/2020   Procedure: ESOPHAGEAL DILATION;  Surgeon: Jackquline Denmark, MD;  Location: [facility] ENDOSCOPY;  Service: Endoscopy;;  . ESOPHAGOGASTRODUODENOSCOPY (EGD) WITH PROPOFOL [name] 05/04/2020   Procedure: ESOPHAGOGASTRODUODENOSCOPY (EGD) WITH PROPOFOL;  Surgeon: Jackquline Denmark, MD;  Location[name] ENDOSCOPY;  Service: Endoscopy;  Laterality: [name];  . EYE SURGERY Bilateral cataract  . HEMORRHOID SURGERY    . INTRAOCULAR LENS INSERTION Bilateral   . KNEE SURGERY Right   . LEFT AND RIGHT HEART CATHETERIZATION WITH CORONARY ANGIOGRAM [name] 12/24/2012   Procedure: LEFT AND RIGHT HEART CATHETERIZATION WITH CORONARY ANGIOGRAM;  Surgeon: [name], MD;  Location: [facility] CATH LAB;  Service: Cardiovascular;  Laterality: [name];  . LEG SURGERY Bilateral    lenghtening   . PACEMAKER PLACEMENT  04/13/2016   2nd deg HB after TAVR, pt had DC MDT PPM placed.  [name] SHOULDER ARTHROSCOPY  08/30/2011   Procedure: ARTHROSCOPY SHOULDER;  Surgeon: [name], MD;  Location: [place];  Service: Orthopedics;  Laterality: Right;  Right Shoulder Arthroscopy, Debridement, and Decompression  . SPINAL CORD STIMULATOR INSERTION [name] 09/10/2015   Procedure: LUMBAR SPINAL CORD STIMULATOR INSERTION;  Surgeon: [name], MD;  Location: [place] NEURO ORS;  Service: Neurosurgery;  Laterality: [name];  . TEE WITHOUT CARDIOVERSION [name] 09/16/2019   Procedure: TRANSESOPHAGEAL ECHOCARDIOGRAM (TEE);  Surgeon: Skeet Latch, MD;  Location: [place];  Service: Cardiovascular;  Laterality: [name];  . TEE WITHOUT CARDIOVERSION [name] 12/02/2019   +vegetation on AVR and pacer lead in RV.  EF 55-60%, normal wall motion.  Valves function normal.  Procedure: TRANSESOPHAGEAL ECHOCARDIOGRAM (TEE);  Surgeon: Geralynn Rile, MD;  Location: [place];  Service: Cardiovascular;  Laterality: [name];  . 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  R big toe surg due to osteoarth  .  TONSILLECTOMY    . traeculectomy Left    eye  . TRANSCATHETER AORTIC VALVE REPLACEMENT, TRANSFEMORAL  04/11/2016  . TRANSESOPHAGEAL ECHOCARDIOGRAM  03/09/2016; 09/2019   [facility]: EF 55-60%, PFO seen with bi-directional shunting, no thrombus in appendage.  09/2019 ->no valvular vegetations. Small patent foramen ovale with predominantly left to right shunting across the interatrial septum.  . TRANSTHORACIC ECHOCARDIOGRAM  01/2015; 01/2016; 05/18/16; 09/18/16, 05/2017, 08/2017   01/2015 No signif change in aortic stenosis (moderate).  01/2016 Severe LVH w/small LV cavity, EF 60-65%, grade I diast dysfxn.  05/2016 (s/p TAVR): EF 50-55%, grd I DD, biopros AV good.  08/2016--EF 50-55%, LV septal motion c/w conduction abnl, grd I DD,mild MS,bioprosth aortic valve well seated, w/trace AR. 05/2017 TTE EF 35%. 08/2017-EF 35%, mod diff hypokin LV, grd I DD, biopros AV good.   . TRANSTHORACIC ECHOCARDIOGRAM  04/2018; 09/2019   04/2018: EF 40-45%, mod diffuse LV hypokinesis, grd I DD, bioprosth AV well seated, no AS or AR. 09/2019 EF 60-65%, grd I DD, valves fine, including bioprosth AV  . VITRECTOMY     Social History   Occupational History  . Occupation: Engineer, production    Comment: retired  Tobacco Use  . Smoking status: Never Smoker  . Smokeless tobacco: Never Used  Vaping Use  . Vaping Use: Never used  Substance and Sexual Activity  . Alcohol use: No    Alcohol/week: 0.0 standard drinks  . Drug use: No    Types: Oxycodone  . Sexual activity: Not Currently

## 2020-07-30 ENCOUNTER — Telehealth: Payer: Self-pay

## 2020-07-30 NOTE — Telephone Encounter (Signed)
I called patient. CT of the foot was ordered, not MRI. Advised someone would be calling him to schedule.

## 2020-07-30 NOTE — Telephone Encounter (Signed)
Patient called he stated he cant complete mri due to having a pacemaker he wants a cat scan if possible CB:[phone]

## 2020-07-31 ENCOUNTER — Other Ambulatory Visit: Payer: Self-pay | Admitting: Family Medicine

## 2020-08-02 ENCOUNTER — Ambulatory Visit: Payer: Medicare Other | Admitting: Family Medicine

## 2020-08-02 ENCOUNTER — Encounter: Payer: Self-pay | Admitting: Family Medicine

## 2020-08-02 ENCOUNTER — Other Ambulatory Visit: Payer: Self-pay

## 2020-08-02 VITALS — BP 143/77 | HR 77 | Temp 97.9°F | Resp 16 | Ht 72.0 in

## 2020-08-02 DIAGNOSIS — E1149 Type 2 diabetes mellitus with other diabetic neurological complication: Secondary | ICD-10-CM

## 2020-08-02 LAB — POCT GLYCOSYLATED HEMOGLOBIN (HGB A1C)
HbA1c POC (<> result, manual entry): 6.1 % (ref 4.0–5.6)
HbA1c, POC (controlled diabetic range): 6.1 % (ref 0.0–7.0)
HbA1c, POC (prediabetic range): 6.1 % (ref 5.7–6.4)
Hemoglobin A1C: 6.1 % — AB (ref 4.0–5.6)

## 2020-08-02 MED ORDER — IPRATROPIUM BROMIDE 0.03 % NA SOLN: NASAL | 1 refills | Status: DC

## 2020-08-02 NOTE — Progress Notes (Unsigned)
OFFICE VISIT  08/04/2020  CC:  Chief Complaint  Patient presents with  . Hospitalization Follow-up    HPI:    Patient is a 80 y.o. Caucasian male who presents for f/u multiple chronic medical problems. I last saw him 12/16/19 for TCM hosp f/u for infective endocarditis. Since that time he has had ongoing problem with chronic L foot/ankle osteomyelitis and is possibly going to get a L BKA. He has to have a cardiac cath first, though (in 2 d).  He is on suppressive abx (amoxil) for hx of enterococcus endocarditis + suspected ongoing osteomyelitis L foot (followed by ID MD, ortho MD, cardiologist).    Approx four week hx of runny nose, sneezing.  Lots of thin and clear nasal mucous.   No pain or pressure in face.  Taking flonase and zycam and nasalcrom and claritin regularly.   No cough or fever or HA or ST.  No alteration in taste or smell. He has had covid and flu vaccines.   Home bps 120-130s/70s.   No signif prob with LE swelling in last couple months, not requiring any scheduled dosing of torsemide but has this med for prn use. He's eating and drinking fine. He walks around his house with 2 canes.   Urinates well, empties bladder good.  Past Medical History:  Diagnosis Date  . AICD (automatic cardioverter/defibrillator) present   . Balanitis    +severe phimosis+ buried penis->circ not possible but dorsal slit done 10/2019  . BPH (benign prostatic hypertrophy)    with urinary retention.  Renal u/s 12/04/19 NORMAL KIDNEYS, NORMAL BLADDER, NO HYDRONEPHROSIS  . CAD (coronary artery disease)    Nonobstructive by 12/2012 cath; then 03/2016 he required BMS to RCA ([facility]).  In-stent restenosis on cath 11/01/16, baloon angioplasty successful.  [name] CATARACT, HX OF   . Chronic combined systolic and diastolic CHF (congestive heart failure) ([place]) 05/2016   Ischemic CM.  04/2018 EF 40-45%, grd I DD.  [name] Chronic pain syndrome    Lumbar DDD; chronic neuropathic pain (DM); has spinal stimulator and  sees pain mgmt MD  . Complicated UTI (urinary tract infection) 09/2019   Phimoses, acute urinary retention, entoerococcus UTI, enterococc bacteremia.  F/u blood clx's neg x 5d.    [name] COPD   . Diabetes mellitus type 2 with complications (HCC)    JME2A jumped from 5.7% to 6.1% 03/2015---started metformin at that time.  DM 2 dx by fasting gluc criteria 2018.  Has chronic neuropath pain  . Enterococcal bacteremia 09/2019   Phimoses, acute urinary retention, entoerococcus UTI, enterococc bacteremia.  F/u blood clx's neg x 5d.    . Essential hypertension, benign   . Fatty liver 2007   2007 u/s showed fatty liver with hepatosplenomegaly.  2019 repeat u/s->fatty liver but no cirrhosis or hepatosplenomegaly.  [name] GERD (gastroesophageal reflux disease)   . Glaucoma   . GOUT   . HH (hiatus hernia)   . HYPERCHOLESTEROLEMIA-PURE   . Hypogonadism male   . ICD (implantable cardioverter-defibrillator) infection ([place]) 12/22/2019  . 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 Tommy Medal)  . Lumbosacral neuritis   . Lumbosacral spondylosis    Lumbar spinal stenosis with neurogenic claudication--contributes to his chronic pain syndrome  . Normal memory function 08/2014   Neuropsychological testing ([facility]): no cognitive impairment or sign of neurodegenerative disorder.  Likely has adjustment d/o with mixed anxiety/depressed features and may benefit from low  dose SNRI.    [name] Normocytic anemia 03/2016   Mild-pt needs ferritin and vit B12 level checked (as of 03/22/16). Hb stable 09/2019  . NSTEMI (non-ST elevated myocardial infarction) ([place]) 03/20/2016   BMS to RCA  . Obesity hypoventilation syndrome ([place])   . Orthostatic hypotension   . OSA on CPAP    8 cm H2O  . OSTEOARTHRITIS   . PAF (paroxysmal atrial fibrillation) ([place])   . Paroxysmal atrial fibrillation ([place]) 2003    (? chronic?) Off anticoag for a  while due to falls.  Then apixaban started 12/2014.  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 Ella Bodo): neuropathic pain->diff to treat, failed nucynta, failed spinal stimul trial, oxycontin hs + tramadol + gabap as of 12/2017 f/u Dr. Letta Pate.  . Personal history of colonic adenoma 10/30/2012   Diminutive adenoma, consider repeat 2019 per GI  . PFO (patent foramen ovale) 09/2019   small, with predominately L to R shunt  . Presence of cardiac defibrillator 11/07/2017  . Primary osteoarthritis of both knees    Bone on bone of medial compartments, + signif patellofemoral arth bilat.--supartz inj series started 09/12/17  . 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 PUD (peptic ulcer disease)   . PULMONARY HYPERTENSION, HX OF   . Rhinitis 07/14/2020  . Secondary male hypogonadism 2017  . Sepsis ([place]) 04/29/2020  . 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 Shortness of breath    with exertion: much improved s/p TAVR and treatment for CHF.  . Sick sinus syndrome (HCC)    PPM placed  . Thrombocytopenia ([place]) 2018   HSM on 2007 abd u/s---suspect some mild splenic sequestration chronically.  [name] Unspecified glaucoma(365.9)   . Unspecified hereditary and idiopathic peripheral neuropathy approx age 61   bilat LE's, ? left arm, too.  Feet became progressively numb + left foot pain intermittently.  Pt may be trying a spinal stimulator (as of 05/2015)  . VENOUS INSUFFICIENCY    Being followed by Dr. Sharol Given as of 10/2016 for two R LL venous stasis ulcers/skin tears.  Healed as of 10/30/16 f/u with Dr. Sharol Given.  . VENTRAL HERNIA   . Wound dehiscence 10/22/2019    Past Surgical History:  Procedure Laterality Date  . AMPUTATION Left 04/11/2013   Procedure: AMPUTATION DIGIT Left 3rd toe;  Surgeon: [name], MD;  Location: [facility];  Service: Orthopedics;  Laterality: Left;  Left 3rd toe amputation at  [facility]  . AMPUTATION Left 08/29/2019   Procedure: LEFT TRANSMETATARSAL AMPUTATION;  Surgeon: [name], MD;  Location: [facility];  Service: Orthopedics;  Laterality: Left;  . BIOPSY  05/04/2020   Procedure: BIOPSY;  Surgeon: Jackquline Denmark, MD;  Location: [facility] ENDOSCOPY;  Service: Endoscopy;;  . [place]; 03/10/16   1997 Non-obstructive disease.  03/2016 BMS to RCA, with 25% pDiag dz, o/w normal cors per cath 03/07/16.  Cath 11/01/16: in stent restenosis, successful baloon angioplasty.  [name] CARDIAC CATHETERIZATION  12/24/2012   mild < 20% LCx, prox 30% RCA; LVEF 55-65% , moderate pulmonary HTN, moderate AS  . CARDIAC DEFIBRILLATOR PLACEMENT  11/07/2017   [facility] MRI Quad CRT defibrillator  . CARDIOVASCULAR STRESS TEST  05/11/16 ([facility])   2017 Myocardial perfusion imaging:  No ischemia; scar in apex, global hypokinesis, EF 36%.  06/15/20->mod primarily fixed inferolat wall defect mildly worse with stress c/w infarct/scar with mild peri-infarct  ischemia, normal EF-->for cath per cards.  . Carotid dopplers  03/09/2016   [facility]: no hemodynamically significant stenosis on either side.  . CHOLECYSTECTOMY    . COLONOSCOPY [name] 10/30/2012   Procedure: COLONOSCOPY;  Surgeon: [name], MD;  Location: [facility] ENDOSCOPY;  Service: Endoscopy;  Laterality: [name];  . CORONARY ANGIOPLASTY WITH STENT PLACEMENT  03/2016; 04/2017   2017-[facility]: BMS to RCA-pt was placed on Brilinta.  04/2017: DES to RCA.  . DORSAL SLIT [name] 10/29/2019   for severe phimosis. Procedure: DORSAL SLIT;  Surgeon: Alexis Frock, MD;  Location: [facility] ORS;  Service: Urology;  Laterality: [name];  57 MINS  . ESOPHAGEAL DILATION  05/04/2020   Procedure: ESOPHAGEAL DILATION;  Surgeon: Jackquline Denmark, MD;  Location: [facility] ENDOSCOPY;  Service: Endoscopy;;  . ESOPHAGOGASTRODUODENOSCOPY (EGD) WITH PROPOFOL [name] 05/04/2020   Procedure: ESOPHAGOGASTRODUODENOSCOPY (EGD) WITH PROPOFOL;  Surgeon: Jackquline Denmark, MD;  Location: [facility] ENDOSCOPY;  Service: Endoscopy;   Laterality: [name];  . EYE SURGERY Bilateral cataract  . HEMORRHOID SURGERY    . INTRAOCULAR LENS INSERTION Bilateral   . KNEE SURGERY Right   . LEFT AND RIGHT HEART CATHETERIZATION WITH CORONARY ANGIOGRAM [name] 12/24/2012   Procedure: LEFT AND RIGHT HEART CATHETERIZATION WITH CORONARY ANGIOGRAM;  Surgeon: [name], MD;  [facility] CATH LAB;  Service: Cardiovascular;  Laterality: [name];  . LEG SURGERY Bilateral    lenghtening   . PACEMAKER PLACEMENT  04/13/2016   2nd deg HB after TAVR, pt had DC MDT PPM placed.  [name] SHOULDER ARTHROSCOPY  08/30/2011   Procedure: ARTHROSCOPY SHOULDER;  Surgeon: [name], MD;  Location: [facility];  Service: Orthopedics;  Laterality: Right;  Right Shoulder Arthroscopy, Debridement, and Decompression  . SPINAL CORD STIMULATOR INSERTION [name] 09/10/2015   Procedure: LUMBAR SPINAL CORD STIMULATOR INSERTION;  Surgeon: [name], MD;  Location: [place] NEURO ORS;  Service: Neurosurgery;  Laterality: [name];  . TEE WITHOUT CARDIOVERSION [name] 09/16/2019   Procedure: TRANSESOPHAGEAL ECHOCARDIOGRAM (TEE);  Surgeon: Skeet Latch, MD;  Location: [facility];  Service: Cardiovascular;  Laterality: [name];  . TEE WITHOUT CARDIOVERSION [name] 12/02/2019   +vegetation on AVR and pacer lead in RV.  EF 55-60%, normal wall motion.  Valves function normal.  Procedure: TRANSESOPHAGEAL ECHOCARDIOGRAM (TEE);  Surgeon: Geralynn Rile, MD;  Location: [facility];  Service: Cardiovascular;  Laterality: [name];  . 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  R big toe surg due to osteoarth  . TONSILLECTOMY    . traeculectomy Left    eye  . TRANSCATHETER AORTIC VALVE REPLACEMENT, TRANSFEMORAL  04/11/2016  . TRANSESOPHAGEAL ECHOCARDIOGRAM  03/09/2016; 09/2019   [facility]: EF 55-60%, PFO seen with bi-directional shunting, no thrombus in appendage.  09/2019 ->no valvular vegetations. Small patent foramen ovale with predominantly left to right shunting across the interatrial septum.  . TRANSTHORACIC  ECHOCARDIOGRAM  01/2015; 01/2016; 05/18/16; 09/18/16, 05/2017, 08/2017   01/2015 No signif change in aortic stenosis (moderate).  01/2016 Severe LVH w/small LV cavity, EF 60-65%, grade I diast dysfxn.  05/2016 (s/p TAVR): EF 50-55%, grd I DD, biopros AV good.  08/2016--EF 50-55%, LV septal motion c/w conduction abnl, grd I DD,mild MS,bioprosth aortic valve well seated, w/trace AR. 05/2017 TTE EF 35%. 08/2017-EF 35%, mod diff hypokin LV, grd I DD, biopros AV good.   . TRANSTHORACIC ECHOCARDIOGRAM  04/2018; 09/2019   04/2018: EF 40-45%, mod diffuse LV hypokinesis, grd I DD, bioprosth AV well seated, no AS or AR. 09/2019 EF 60-65%, grd I DD, valves fine, including bioprosth  AV  . VITRECTOMY      Outpatient Medications Prior to Visit  Medication Sig Dispense Refill  . allopurinol (ZYLOPRIM) 300 MG tablet TAKE 1 TABLET BY MOUTH ONCE DAILY WITH FOOD 90 tablet 1  . amoxicillin (AMOXIL) 500 MG tablet Take 2 tablets (1,000 mg total) by mouth 2 (two) times daily. 120 tablet 11  . apixaban (ELIQUIS) 5 MG TABS tablet Take 5 mg by mouth 2 (two) times daily.    . ASPIRIN LOW DOSE 81 MG EC tablet Take 81 mg by mouth daily.    . B Complex-C (B-COMPLEX WITH VITAMIN C) tablet Take 1 tablet by mouth daily. Take 1 tablet daily, 250m    . ezetimibe (ZETIA) 10 MG tablet Take 1 tablet (10 mg total) by mouth daily. 30 tablet 1  . finasteride (PROSCAR) 5 MG tablet TAKE 1 TABLET BY MOUTH EVERY DAY 30 tablet 0  . fluticasone (FLONASE) 50 MCG/ACT nasal spray Place 2 sprays into both nostrils as needed for allergies or rhinitis.    .Marland Kitchengabapentin (NEURONTIN) 800 MG tablet TAKE 1 TABLET (800 MG TOTAL) BY MOUTH 4 (FOUR) TIMES DAILY. 360 tablet 2  . metoprolol succinate (TOPROL-XL) 25 MG 24 hr tablet Take 25 mg by mouth daily.    . Multiple Vitamin (MULTIVITAMIN ADULT PO) Take by mouth daily.    . niacin (NIASPAN) 1000 MG CR tablet TAKE 1 TABLET BY MOUTH TWICE A DAY 180 tablet 0  . nitroGLYCERIN (NITROSTAT) 0.4 MG SL tablet Place 0.4 mg  under the tongue every 5 (five) minutes as needed for chest pain.     . pantoprazole (PROTONIX) 40 MG tablet TAKE 1 TABLET BY MOUTH EVERY DAY 90 tablet 3  . Respiratory Therapy Supplies MISC Inhale into the lungs.    . rosuvastatin (CRESTOR) 40 MG tablet take 1 tablet by mouth once daily (Patient taking differently: Take 40 mg by mouth daily.) 15 tablet 0  . sacubitril-valsartan (ENTRESTO) 24-26 MG Take 1 tablet by mouth 2 (two) times daily.     . vitamin C (ASCORBIC ACID) 250 MG tablet Take 250 mg by mouth daily.    .Marland KitchenVITAMIN D, CHOLECALCIFEROL, PO Take 1 tablet by mouth daily.     .Grant RutsINHUB 250-50 MCG/DOSE AEPB TAKE 1 PUFF BY MOUTH TWICE A DAY 60 each 11  . KLOR-CON M20 20 MEQ tablet TAKE 1 TABLET BY MOUTH EVERY DAY 30 tablet 0  . albuterol (PROVENTIL HFA;VENTOLIN HFA) 108 (90 BASE) MCG/ACT inhaler Inhale 2 puffs into the lungs 4 (four) times daily as needed for wheezing or shortness of breath. (Patient not taking: Reported on 08/02/2020)    . torsemide (DEMADEX) 20 MG tablet Take 1 tablet (20 mg total) by mouth in the morning and at bedtime. (Patient not taking: Reported on 08/02/2020) 60 tablet 0  . Ampicillin Sodium 10 g SOLR Take 500 mg by mouth in the morning and at bedtime. (Patient not taking: No sig reported)    . mometasone-formoterol (DULERA) 100-5 MCG/ACT AERO Inhale 2 puffs into the lungs 2 (two) times daily for 60 doses. (Patient not taking: Reported on 08/02/2020) 1 each 0  . spironolactone (ALDACTONE) 25 MG tablet Take 0.5 tablets (12.5 mg total) by mouth daily. (Patient not taking: Reported on 08/02/2020) 15 tablet 0   No facility-administered medications prior to visit.    Allergies  Allergen Reactions  . Brimonidine Tartrate Shortness Of Breath    Alphagan-Shortness of breath  . Brinzolamide Shortness Of Breath    AZOPT-  Shortness of breath  . Latanoprost Shortness Of Breath    XALATAN- Shortness of breath  . Nucynta [Tapentadol] Shortness Of Breath  . Sulfa  Antibiotics Palpitations  . Timolol Maleate Shortness Of Breath and Other (See Comments)    TIMOPTIC- Aggravated asthma  . Diltiazem Swelling     leg swelling  . Rofecoxib Swelling     VIOXX- leg swelling  . Vancomycin Hives and Other (See Comments)    Possible "Red Man Syndrome"? > hives/blisters  . Codeine Other (See Comments)    Childhood reaction  . Tamsulosin Other (See Comments)    Dizziness   . Celecoxib Other (See Comments)    CELLBREX-confusion  . Colchicine Diarrhea    diarrhea  . Tape Rash    ROS As per HPI  PE: Vitals with BMI 08/02/2020 07/14/2020 06/22/2020  Height 6' 0"  - -  Weight (No Data) - -  BMI - - -  Systolic 366 294 765  Diastolic 77 71 74  Pulse 77 84 80   Gen: Alert, tired-appearing, sitting in WC.  Patient is oriented to person, place, time, and situation. AFFECT: pleasant, lucid thought and speech. ENT: Ears: EACs clear, normal epithelium.  TMs with good light reflex and landmarks bilaterally.  Eyes: no injection, icteris, swelling, or exudate.  EOMI, PERRLA. Nose: clear mucous lining nasal passages but nothing purulent and no turbinate edema/swelling.  No injection or focal lesion.  Mouth: lips without lesion/swelling.  Oral mucosa pink and moist.  Dentition intact and without obvious caries or gingival swelling.  Oropharynx without erythema, exudate, or swelling.  CV: RRR, no audible m/r/g Chest is clear, no wheezing or rales. Normal symmetric air entry throughout both lung fields. No chest wall deformities or tenderness. ABD: soft, NT/ND EXT: he has thick wraps on both LL's->unable to examine   LABS:  Lab Results  Component Value Date   TSH 0.905 04/29/2020   Lab Results  Component Value Date   WBC 7.2 07/14/2020   HGB 12.2 (L) 07/14/2020   HCT 36.7 (L) 07/14/2020   MCV 89.3 07/14/2020   PLT 121 (L) 07/14/2020   Lab Results  Component Value Date   IRON 59 05/24/2016   IRON 54 05/24/2016   TIBC 334 05/24/2016   FERRITIN 44.8  05/24/2016   Lab Results  Component Value Date   VITAMINB12 429 05/24/2016    Lab Results  Component Value Date   CREATININE 0.81 07/14/2020   BUN 25 07/14/2020   NA 141 07/14/2020   K 4.5 07/14/2020   CL 106 07/14/2020   CO2 28 07/14/2020   Lab Results  Component Value Date   ALT 19 05/07/2020   AST 21 05/07/2020   ALKPHOS 47 05/07/2020   BILITOT 0.4 05/07/2020   Lab Results  Component Value Date   CHOL 107 12/25/2018   Lab Results  Component Value Date   HDL 62.70 12/25/2018   Lab Results  Component Value Date   LDLCALC 29 12/25/2018   Lab Results  Component Value Date   TRIG 72.0 12/25/2018   Lab Results  Component Value Date   CHOLHDL 2 12/25/2018   Lab Results  Component Value Date   PSA 0.23 11/23/2015   PSA 0.33 10/07/2014   PSA 0.40 09/25/2013   Lab Results  Component Value Date   HGBA1C 6.1 (A) 08/02/2020   HGBA1C 6.1 08/02/2020   HGBA1C 6.1 08/02/2020   HGBA1C 6.1 08/02/2020   POC Hba1c today is 6.1%  IMPRESSION AND PLAN:  1) Rhinorrhea.  No clear sign of infection. Will try ipratropium nasal spray rx today.  2) DM 2, great control on diet alone (a1c 6.1% today). CBC and CMET 07/14/20 all good/stable.  3) Chronic osteomyelitis: ID recommends L BKA as best treatment at this time and sounds like ortho is going to proceed with that plan as long as they get the all-clear after Bob's cath scheduled for later this week.  4) Hx of enterococcus endocarditis: now on amoxil suppression therapy. Certainly getting #3 above done would help this situation out a lot.  5) HTN: stable. Lytes normal and sCr 0.81 about 2 weeks ago.  6) CAD, hx of PCI 2018, with hx of reduced EF: EF recovered to ?>45% (s/p CRT), pt continuing on toprol and entresto, has bumex for prn inc in LE swelling/wt gain but has not required this lately. Recent CP admission 11/26-12/1, 2021. Ruled out. Med mgmt continued. Stress test 06/15/20 showed possible peri-infarct  ischemia-->cardiologist to do cath in 2d.  Spent 35 min with pt today reviewing HPI, reviewing relevant past history, doing exam, reviewing and discussing lab and imaging data, and formulating plans.  An After Visit Summary was printed and given to the patient.  FOLLOW UP: Return in about 3 months (around 10/30/2020) for routine chronic illness f/u.  Signed:  Crissie Sickles, MD           08/04/2020

## 2020-08-03 ENCOUNTER — Telehealth: Payer: Self-pay | Admitting: Pulmonary Disease

## 2020-08-03 ENCOUNTER — Other Ambulatory Visit: Payer: Self-pay | Admitting: Family Medicine

## 2020-08-03 NOTE — Telephone Encounter (Signed)
The patient has been scheduled w/ me on 2/7 @ 2:30.  Nothing further needed at this time.

## 2020-08-04 ENCOUNTER — Encounter: Payer: Self-pay | Admitting: Family Medicine

## 2020-08-05 ENCOUNTER — Other Ambulatory Visit: Payer: Self-pay | Admitting: Family Medicine

## 2020-08-09 ENCOUNTER — Other Ambulatory Visit: Payer: Self-pay

## 2020-08-09 ENCOUNTER — Ambulatory Visit: Payer: Medicare Other

## 2020-08-09 DIAGNOSIS — G4733 Obstructive sleep apnea (adult) (pediatric): Secondary | ICD-10-CM

## 2020-08-12 ENCOUNTER — Ambulatory Visit: Payer: Medicare Other | Admitting: Infectious Disease

## 2020-08-16 DIAGNOSIS — G4733 Obstructive sleep apnea (adult) (pediatric): Secondary | ICD-10-CM

## 2020-08-17 ENCOUNTER — Other Ambulatory Visit: Payer: Self-pay

## 2020-08-17 ENCOUNTER — Ambulatory Visit
Admission: RE | Admit: 2020-08-17 | Discharge: 2020-08-17 | Disposition: A | Discharge status: Home / Self Care | Payer: Medicare Other | Source: Ambulatory Visit | Attending: Orthopedic Surgery | Admitting: Orthopedic Surgery

## 2020-08-17 DIAGNOSIS — T8781 Dehiscence of amputation stump: Secondary | ICD-10-CM

## 2020-08-17 DIAGNOSIS — Z89432 Acquired absence of left foot: Secondary | ICD-10-CM

## 2020-08-20 ENCOUNTER — Telehealth: Payer: Self-pay

## 2020-08-20 NOTE — Telephone Encounter (Signed)
Called and lm on vm for pt to advise that Dr. Sharol Given had reviewed his CT scan and would like to make an appt for Monday to discuss results. Will hold message to follow up with pt on Monday.

## 2020-08-23 ENCOUNTER — Ambulatory Visit (INDEPENDENT_AMBULATORY_CARE_PROVIDER_SITE_OTHER): Payer: Medicare Other | Admitting: Orthopedic Surgery

## 2020-08-23 ENCOUNTER — Telehealth: Payer: Self-pay

## 2020-08-23 DIAGNOSIS — L97521 Non-pressure chronic ulcer of other part of left foot limited to breakdown of skin: Secondary | ICD-10-CM

## 2020-08-23 DIAGNOSIS — Z89432 Acquired absence of left foot: Secondary | ICD-10-CM | POA: Diagnosis not present

## 2020-08-23 DIAGNOSIS — I87332 Chronic venous hypertension (idiopathic) with ulcer and inflammation of left lower extremity: Secondary | ICD-10-CM | POA: Diagnosis not present

## 2020-08-23 DIAGNOSIS — T8781 Dehiscence of amputation stump: Secondary | ICD-10-CM

## 2020-08-23 DIAGNOSIS — L97929 Non-pressure chronic ulcer of unspecified part of left lower leg with unspecified severity: Secondary | ICD-10-CM

## 2020-08-23 NOTE — Telephone Encounter (Signed)
Pt has an appt today.  

## 2020-08-23 NOTE — Telephone Encounter (Signed)
Pt called back he has appt for 2:30 today

## 2020-08-23 NOTE — Telephone Encounter (Signed)
Please schedule for Dr. Sharol Given. I called and lm on vm Friday thank you fo following up.

## 2020-08-23 NOTE — Telephone Encounter (Signed)
Left message for patient to call back  

## 2020-08-23 NOTE — Telephone Encounter (Signed)
-----   Message from Freddi Starr, MD sent at 08/19/2020  7:19 PM EST ----- Regarding: Sleep Study results Please let the patient know he has mild to moderate obstructive sleep apnea and should be scheduled with once of our sleep physicians for further follow up.  Thanks, Wille Glaser  ----- Message ----- From: Harland German Sent: 08/16/2020   2:51 PM EST To: Freddi Starr, MD

## 2020-08-24 ENCOUNTER — Encounter: Payer: Self-pay | Admitting: Orthopedic Surgery

## 2020-08-24 ENCOUNTER — Other Ambulatory Visit: Payer: Self-pay | Admitting: Family Medicine

## 2020-08-24 NOTE — Progress Notes (Signed)
Office Visit Note   Patient: Tim [name]           Date of Birth: 01/20/1941           MRN: [id number] Visit Date: 08/23/2020              Requested by: Tim Sou, MD [address] [place],  [place] [address] PCP: Tim Sou, MD  Chief Complaint  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  [name]  Assessment & Plan: Visit Diagnoses:  1. History of transmetatarsal amputation of left foot ([place])   2. Dehiscence of amputation stump ([place])   3. Non-pressure chronic ulcer of other part of left foot limited to breakdown of skin ([place])   4. Chronic venous hypertension (idiopathic) with ulcer and inflammation of left lower extremity (HCC)     Plan: With the normal CT scan and no clinical evidence of an abscess I feel the biggest barrier to healing is the swelling in his foot which is exacerbated by his CircAid wrap.  Recommended continue with the compression stocking over the foot continue dry dressing changes elevation work the calf pump.  Follow-Up Instructions: Return in about 3 weeks (around 09/13/2020).   Ortho Exam  [name], oriented, no adenopathy, well-dressed, normal affect, normal respiratory effort. Examination patient has a strong palpable dorsalis pedis pulse he has significant swelling of the left foot transmetatarsal amputation with an ulcer distally the ulcer is 1 cm diameter after informed consent a 10 blade knife was used to debride the skin and soft tissue back to healthy viable bleeding granulation tissue the ulcer was 3 cm in diameter after debridement 5 mm deep there is no tunneling no exposed bone or tendon there was healthy granulation tissue this was touched with silver nitrate and  a dry gauze dressing was applied.  Patient does have pitting edema in the foot which is exacerbated by the CircAid wrap for the venous insufficiency of the left leg.  After debridement there is no odor no drainage no fluctuance no clinical signs of abscess.  MRI scan is reviewed which shows no osteomyelitis there is a suggestion of abscess but I feel this is consistent with the venous swelling.  Imaging: No results found. No images are attached to the encounter.  Labs: Lab Results  Component Value Date   HGBA1C 6.1 (A) 08/02/2020   HGBA1C 6.1 08/02/2020   HGBA1C 6.1 08/02/2020   HGBA1C 6.1 08/02/2020   ESRSEDRATE 38 (H) 07/14/2020   ESRSEDRATE 20 02/09/2016   ESRSEDRATE 36 (H) 01/28/2014   CRP 0.9 07/14/2020   CRP 0.7 05/03/2020   CRP 1.0 (H) 05/02/2020   LABURIC 3.5 (L) 09/10/2013   REPTSTATUS 05/04/2020 FINAL 04/28/2020   CULT  04/28/2020    NO GROWTH 5 DAYS Performed at [facility], [place] [address] [place], [place] [address]    LABORGA ENTEROCOCCUS FAECALIS 09/11/2019     Lab Results  Component Value Date   ALBUMIN 3.0 (L) 05/07/2020   ALBUMIN 2.9 (L) 05/06/2020   ALBUMIN 3.0 (L) 05/05/2020   LABURIC 3.5 (L) 09/10/2013    Lab Results  Component Value Date   MG 1.7 05/07/2020  MG 1.6 (L) 05/06/2020   MG 1.8 05/05/2020   No results found for: VD25OH  No results found for: PREALBUMIN CBC EXTENDED Latest Ref Rng & Units 07/14/2020 05/07/2020 05/06/2020  WBC 3.8 - 10.8 Thousand/uL 7.2 5.5 5.6  RBC 4.20 - 5.80 Million/uL 4.11(L) 3.54(L) 3.41(L)  HGB 13.2 - 17.1 g/dL 12.2(L) 10.2(L) 10.1(L)  HCT 38.5 - 50.0 % 36.7(L) 32.1(L) 31.0(L)  PLT 140 - 400 Thousand/uL 121(L) 133(L) 129(L)  NEUTROABS 1,500 - 7,800 cells/uL 4,694 3.0 3.2  LYMPHSABS 850 - 3,900 cells/uL 1,390 1.6 1.6     There is no height or weight on file to calculate BMI.  Orders:  No orders of the defined types were placed in this encounter.  No orders of the defined types were placed in this  encounter.    Procedures: No procedures performed  Clinical Data: No additional findings.  ROS:  All other systems negative, except as noted in the HPI. Review of Systems  Objective: Vital Signs: There were no vitals taken for this visit.  Specialty Comments:  No specialty comments available.  PMFS History: Patient Active Problem List   Diagnosis Date Noted  . Rhinitis 07/14/2020  . Mitral valve vegetation 05/28/2020  . Non-pressure chronic ulcer of other part of left foot limited to breakdown of skin ([place])   . Esophageal dysphagia   . Bacteremia   . Endocarditis of mitral valve   . Fever 04/28/2020  . Cervical lymphadenopathy 03/15/2020  . 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 ICD (implantable cardioverter-defibrillator) infection ([place]) 12/22/2019  . Gram-positive bacteremia 11/27/2019  . FUO (fever of unknown origin) 11/26/2019  . Low blood pressure reading 11/05/2019  . Wound dehiscence 10/22/2019  . PFO (patent foramen ovale) 10/06/2019  . ICD (implantable cardioverter-defibrillator) battery depletion   . Pressure injury of skin 09/13/2019  . Enterococcal bacteremia history  09/13/2019  . 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  . 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 Urinary retention 09/13/2019  . Phimosis 09/13/2019  . SIRS (systemic inflammatory response syndrome) ([place]) 09/12/2019  . Sepsis ([place]) 09/12/2019  . Enterococcus UTI 09/12/2019  . Cellulitis of foot   . History of transmetatarsal amputation of left foot ([place])   . Subacute osteomyelitis, left ankle and foot ([place])   . 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 ([place]) 04/17/2018  . Diabetic neuropathy, painful ([place]) 12/26/2016  . Neuropathic pain of foot, left 06/20/2016  . 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 Status post amputation of left great toe ([place]) 06/08/2015  . Spinal stenosis of  lumbar region 02/16/2015  . Hereditary and idiopathic peripheral neuropathy 01/12/2015  . Oral thrush 12/28/2014  . Orthostatic dizziness 05/24/2014  . Paresthesias with subjective weakness 01/30/2014  . Bilateral thigh pain 01/30/2014  . Abdominal wall pain in right upper quadrant 01/22/2014  . Gross hematuria 09/25/2013  . Intertrigo 09/25/2013  . IBS (irritable bowel syndrome) 09/11/2013  . Chronic pain syndrome 08/06/2013  . CAD (coronary artery disease), native coronary artery 12/25/2012  . Paroxysmal atrial fibrillation ([place]) 12/25/2012  . Moderate aortic stenosis 12/25/2012  . Aortic stenosis 12/24/2012  . Personal history of colonic adenoma 10/30/2012  . Diverticulosis of colon (without mention of hemorrhage) 10/30/2012  . Internal hemorrhoids 10/30/2012  . Change in bowel habits intermittent loose stools 10/30/2012  . Routine health maintenance 06/09/2012  . Hereditary peripheral neuropathy 10/10/2011  . Long term current use of anticoagulant - apixiban 08/16/2010  . Dyspnea on exertion  09/08/2009  . HYPERCHOLESTEROLEMIA-PURE 01/13/2009  . Essential hypertension, benign 01/13/2009  . EDEMA 01/13/2009  . GOUT 01/12/2009  . GLAUCOMA 01/12/2009  . Venous insufficiency (chronic) (peripheral) 01/12/2009  . COPD (chronic obstructive pulmonary disease) ([place]) 01/12/2009  . VENTRAL HERNIA 01/12/2009  . OSTEOARTHRITIS 01/12/2009  . ARTHRITIS 01/12/2009  . DEGENERATIVE DISC DISEASE 01/12/2009  . CATARACT, HX OF 01/12/2009  . HEART MURMUR, HX OF 01/12/2009  . BENIGN PROSTATIC HYPERTROPHY, HX OF 01/12/2009  . Obesity 01/17/2007  . OBSTRUCTIVE SLEEP APNEA 01/17/2007  . ASTHMA 01/17/2007  . GERD 01/17/2007  . HALLUX VALGUS, ACQUIRED 01/17/2007  . Pulmonary hypertension ([place]) 01/17/2007   Past Medical History:  Diagnosis Date  . AICD (automatic cardioverter/defibrillator) present   . Balanitis    +severe phimosis+ buried penis->circ not possible but dorsal slit done 10/2019   . BPH (benign prostatic hypertrophy)    with urinary retention.  Renal u/s 12/04/19 NORMAL KIDNEYS, NORMAL BLADDER, NO HYDRONEPHROSIS  . CAD (coronary artery disease)    Nonobstructive by 12/2012 cath; then 03/2016 he required BMS to RCA ([facility]).  In-stent restenosis on cath 11/01/16, baloon angioplasty successful.  [name] CATARACT, HX OF   . Chronic combined systolic and diastolic CHF (congestive heart failure) ([place]) 05/2016   Ischemic CM.  04/2018 EF 40-45%, grd I DD.  [name] Chronic pain syndrome    Lumbar DDD; chronic neuropathic pain (DM); has spinal stimulator and sees pain mgmt MD  . Complicated UTI (urinary tract infection) 09/2019   Phimoses, acute urinary retention, entoerococcus UTI, enterococc bacteremia.  F/u blood clx's neg x 5d.    [name] COPD   . Diabetes mellitus type 2 with complications (HCC)    RFF6B jumped from 5.7% to 6.1% 03/2015---started metformin at that time.  DM 2 dx by fasting gluc criteria 2018.  Has chronic neuropath pain  . Enterococcal bacteremia 09/2019   Phimoses, acute urinary retention, entoerococcus UTI, enterococc bacteremia.  F/u blood clx's neg x 5d.    . Essential hypertension, benign   . Fatty liver 2007   2007 u/s showed fatty liver with hepatosplenomegaly.  2019 repeat u/s->fatty liver but no cirrhosis or hepatosplenomegaly.  [name] GERD (gastroesophageal reflux disease)    + hx of esoph stenosis, +dilation  . Glaucoma   . GOUT   . HH (hiatus hernia)   . HYPERCHOLESTEROLEMIA-PURE   . Hypogonadism male   . ICD (implantable cardioverter-defibrillator) infection ([place]) 12/22/2019  . 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 Tommy Medal)  . Lumbosacral neuritis   . Lumbosacral spondylosis    Lumbar spinal stenosis with neurogenic claudication--contributes to his chronic pain syndrome  . Normal memory function 08/2014   Neuropsychological testing ([facility]): no  cognitive impairment or sign of neurodegenerative disorder.  Likely has adjustment d/o with mixed anxiety/depressed features and may benefit from low dose SNRI.    [name] Normocytic anemia 03/2016   Mild-pt needs ferritin and vit B12 level checked (as of 03/22/16). Hb stable 09/2019  . NSTEMI (non-ST elevated myocardial infarction) ([place]) 03/20/2016   BMS to RCA  . Obesity hypoventilation syndrome ([place])   . Orthostatic hypotension   . OSA on CPAP    8 cm H2O  . OSTEOARTHRITIS   . PAF (paroxysmal atrial fibrillation) ([place])   . Paroxysmal atrial fibrillation ([place]) 2003    (? chronic?) Off anticoag for a while due to falls.  Then apixaban started 12/2014.  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 Ella Bodo): neuropathic pain->diff to treat, failed nucynta, failed spinal stimul trial, oxycontin hs + tramadol + gabap as of 12/2017 f/u Dr. Letta Pate.  . Personal history of colonic adenoma 10/30/2012   Diminutive adenoma, consider repeat 2019 per GI  . PFO (patent foramen ovale) 09/2019   small, with predominately L to R shunt  . Presence of cardiac defibrillator 11/07/2017  . Primary osteoarthritis of both knees    Bone on bone of medial compartments, + signif patellofemoral arth bilat.--supartz inj series started 09/12/17  . 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 PUD (peptic ulcer disease)   . PULMONARY HYPERTENSION, HX OF   . Rhinitis 07/14/2020  . Secondary male hypogonadism 2017  . Sepsis ([place]) 04/29/2020  . 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 Shortness of breath    with exertion: much improved s/p TAVR and treatment for CHF.  . Sick sinus syndrome (HCC)    PPM placed  . Thrombocytopenia ([place]) 2018   HSM on 2007 abd u/s---suspect some mild splenic sequestration chronically.  [name] Unspecified glaucoma(365.9)   . Unspecified hereditary and idiopathic peripheral neuropathy approx age  78   bilat LE's, ? left arm, too.  Feet became progressively numb + left foot pain intermittently.  Pt may be trying a spinal stimulator (as of 05/2015)  . VENOUS INSUFFICIENCY    Being followed by Dr. Sharol Given as of 10/2016 for two R LL venous stasis ulcers/skin tears.  Healed as of 10/30/16 f/u with Dr. Sharol Given.  . VENTRAL HERNIA   . Wound dehiscence 10/22/2019    Family History  Problem Relation Age of Onset  . Hypertension Mother   . Coronary artery disease Mother   . Heart attack Mother   . Neuropathy Mother   . Pulmonary fibrosis Father        asbestosis    Past Surgical History:  Procedure Laterality Date  . AMPUTATION Left 04/11/2013   Procedure: AMPUTATION DIGIT Left 3rd toe;  Surgeon: [name], MD;  Location: [facility];  Service: Orthopedics;  Laterality: Left;  Left 3rd toe amputation at [facility]  . AMPUTATION Left 08/29/2019   Procedure: LEFT TRANSMETATARSAL AMPUTATION;  Surgeon: [name], MD;  Location: [facility];  Service: Orthopedics;  Laterality: Left;  . BIOPSY  05/04/2020   Procedure: BIOPSY;  Surgeon: Jackquline Denmark, MD;  Location: [facility] ENDOSCOPY;  Service: Endoscopy;;  . [place]; 03/10/16   1997 Non-obstructive disease.  03/2016 BMS to RCA, with 25% pDiag dz, o/w normal cors per cath 03/07/16.  Cath 11/01/16: in stent restenosis, successful baloon angioplasty.  [name] CARDIAC CATHETERIZATION  12/24/2012   mild < 20% LCx, prox 30% RCA; LVEF 55-65% , moderate pulmonary HTN, moderate AS  . CARDIAC DEFIBRILLATOR PLACEMENT  11/07/2017   [facility] MRI Quad CRT defibrillator  . CARDIOVASCULAR STRESS TEST  05/11/16 ([facility])   2017 Myocardial perfusion imaging:  No ischemia; scar in apex, global hypokinesis, EF 36%.  06/15/20->mod primarily fixed inferolat wall defect mildly worse with stress c/w infarct/scar with mild peri-infarct ischemia, normal EF-->for cath per cards.  . Carotid dopplers  03/09/2016   [facility]: no hemodynamically significant stenosis on either side.  .  CHOLECYSTECTOMY    . COLONOSCOPY [name] 10/30/2012   Procedure: COLONOSCOPY;  Surgeon: [name], MD;  Location: [facility] ENDOSCOPY;  Service: Endoscopy;  Laterality: [name];  . CORONARY ANGIOPLASTY WITH STENT PLACEMENT  03/2016; 04/2017   [id number]: BMS to RCA-pt was placed on Brilinta.  04/2017: DES to RCA.  . DORSAL SLIT [name] 10/29/2019   for severe phimosis. Procedure: DORSAL SLIT;  Surgeon: Alexis Frock, MD;  Location: [facility] ORS;  Service: Urology;  Laterality: [name];  31 MINS  . ESOPHAGEAL DILATION  05/04/2020   Procedure: ESOPHAGEAL DILATION;  Surgeon: Jackquline Denmark, MD;  Location: [name] ENDOSCOPY;  Service: Endoscopy;;  . ESOPHAGOGASTRODUODENOSCOPY (EGD) WITH PROPOFOL [name] 05/04/2020   Procedure: ESOPHAGOGASTRODUODENOSCOPY (EGD) WITH PROPOFOL;  Surgeon: Jackquline Denmark, MD;  Location: [facility] ENDOSCOPY;  Service: Endoscopy;  Laterality: [name];  . EYE SURGERY Bilateral cataract  . HEMORRHOID SURGERY    . INTRAOCULAR LENS INSERTION Bilateral   . KNEE SURGERY Right   . LEFT AND RIGHT HEART CATHETERIZATION WITH CORONARY ANGIOGRAM [name] 12/24/2012   Procedure: LEFT AND RIGHT HEART CATHETERIZATION WITH CORONARY ANGIOGRAM;  Surgeon: [name], MD;  Location: [facility] CATH LAB;  Service: Cardiovascular;  Laterality: [name];  . LEG SURGERY Bilateral    lenghtening   . PACEMAKER PLACEMENT  04/13/2016   2nd deg HB after TAVR, pt had DC MDT PPM placed.  [name] SHOULDER ARTHROSCOPY  08/30/2011   Procedure: ARTHROSCOPY SHOULDER;  Surgeon: [name], MD;  Location: [facility];  Service: Orthopedics;  Laterality: Right;  Right Shoulder Arthroscopy, Debridement, and Decompression  . SPINAL CORD STIMULATOR INSERTION [name] 09/10/2015   Procedure: LUMBAR SPINAL CORD STIMULATOR INSERTION;  Surgeon: [name], MD;  Location: [facility] NEURO ORS;  Service: Neurosurgery;  Laterality: [name];  . TEE WITHOUT CARDIOVERSION [name] 09/16/2019   Procedure: TRANSESOPHAGEAL ECHOCARDIOGRAM (TEE);  Surgeon: Skeet Latch, MD;  Location: [facility];  Service:  Cardiovascular;  Laterality: [name];  . TEE WITHOUT CARDIOVERSION [name] 12/02/2019   +vegetation on AVR and pacer lead in RV.  EF 55-60%, normal wall motion.  Valves function normal.  Procedure: TRANSESOPHAGEAL ECHOCARDIOGRAM (TEE);  Surgeon: Geralynn Rile, MD;  Location: [facility];  Service: Cardiovascular;  Laterality: [name];  . 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  R big toe surg due to osteoarth  . TONSILLECTOMY    . traeculectomy Left    eye  . TRANSCATHETER AORTIC VALVE REPLACEMENT, TRANSFEMORAL  04/11/2016  . TRANSESOPHAGEAL ECHOCARDIOGRAM  03/09/2016; 09/2019   [facility]: EF 55-60%, PFO seen with bi-directional shunting, no thrombus in appendage.  09/2019 ->no valvular vegetations. Small patent foramen ovale with predominantly left to right shunting across the interatrial septum.  . TRANSTHORACIC ECHOCARDIOGRAM  01/2015; 01/2016; 05/18/16; 09/18/16, 05/2017, 08/2017   01/2015 No signif change in aortic stenosis (moderate).  01/2016 Severe LVH w/small LV cavity, EF 60-65%, grade I diast dysfxn.  05/2016 (s/p TAVR): EF 50-55%, grd I DD, biopros AV good.  08/2016--EF 50-55%, LV septal motion c/w conduction abnl, grd I DD,mild MS,bioprosth aortic valve well seated, w/trace AR. 05/2017 TTE EF 35%. 08/2017-EF 35%, mod diff hypokin LV, grd I DD, biopros AV good.   . TRANSTHORACIC ECHOCARDIOGRAM  04/2018; 09/2019   04/2018: EF 40-45%, mod diffuse LV hypokinesis, grd I DD, bioprosth AV well seated, no AS or AR. 09/2019 EF 60-65%, grd I DD, valves fine, including bioprosth AV. 04/29/20 (tech diff) EF 55-60%, grd I DD, vegetation on MV.  [name] VITRECTOMY     Social History   Occupational History  . Occupation: Engineer, production    Comment: retired  Tobacco Use  . Smoking status: Never Smoker  . Smokeless tobacco: Never Used  Vaping Use  . Vaping Use: Never used  Substance and  Sexual Activity  . Alcohol use: No    Alcohol/week: 0.0 standard drinks  . Drug use: No    Types: Oxycodone  . Sexual activity:  Not Currently

## 2020-09-01 ENCOUNTER — Other Ambulatory Visit: Payer: Self-pay | Admitting: Family Medicine

## 2020-09-13 ENCOUNTER — Ambulatory Visit: Payer: Medicare Other | Admitting: Orthopedic Surgery

## 2020-09-15 ENCOUNTER — Ambulatory Visit (INDEPENDENT_AMBULATORY_CARE_PROVIDER_SITE_OTHER): Payer: Medicare Other | Admitting: Family

## 2020-09-15 ENCOUNTER — Other Ambulatory Visit: Payer: Self-pay

## 2020-09-15 DIAGNOSIS — Z89432 Acquired absence of left foot: Secondary | ICD-10-CM

## 2020-09-15 DIAGNOSIS — L97521 Non-pressure chronic ulcer of other part of left foot limited to breakdown of skin: Secondary | ICD-10-CM | POA: Diagnosis not present

## 2020-09-22 ENCOUNTER — Encounter: Payer: Self-pay | Admitting: Family

## 2020-09-22 NOTE — Progress Notes (Signed)
Office Visit Note   Patient: Tim [name]           Date of Birth: September 15, 1940           MRN: [id number] Visit Date: 09/15/2020              Requested by: Tammi Sou, MD [address] [place],  [place] [address] PCP: Tammi Sou, MD  No chief complaint on file.     HPI: The patient is an 80 year old gentleman who comes in today in follow-up for ulcer to his left transmetatarsal amputation.  He has a history of venous insufficiency with chronic ulceration over his transmetatarsal amputation.  Chronic swelling to his left residual limb he has tried using the medical compression stockings in the past.  Had significant difficulty donning and doffing.  He has been using CircAid wraps   Assessment & Plan: Visit Diagnoses:  No diagnosis found.  Plan: Discussed different compression methods at length with the patient.  He will continue with his CircAid wraps.  He states he may attempt to wear the medical compression stockings more consistently.  Continue with daily wound cleansing and dry dressings.  Follow-Up Instructions: No follow-ups on file.   Ortho Exam  [name], oriented, no adenopathy, well-dressed, normal affect, normal respiratory effort. Examination patient has a strong palpable dorsalis pedis pulse he has significant swelling of the left foot.  Ulcer to the distal aspect of his transmetatarsal amputation.  This is 1 cm in diameter was debrided of nonviable tissue and callus after debridement this is 3 mm deep with no probing or tunneling no odor no drainage no fluctuance no clinical signs of abscess.   Imaging: No results found. No images are attached to the encounter.  Labs: Lab Results  Component Value Date   HGBA1C 6.1 (A) 08/02/2020   HGBA1C 6.1 08/02/2020   HGBA1C 6.1 08/02/2020   HGBA1C 6.1 08/02/2020   ESRSEDRATE 38 (H) 07/14/2020   ESRSEDRATE 20 02/09/2016   ESRSEDRATE 36 (H) 01/28/2014   CRP 0.9 07/14/2020   CRP 0.7 05/03/2020    CRP 1.0 (H) 05/02/2020   LABURIC 3.5 (L) 09/10/2013   REPTSTATUS 05/04/2020 FINAL 04/28/2020   CULT  04/28/2020    NO GROWTH 5 DAYS Performed at [facility], [place] [address], [place],  [address]    LABORGA ENTEROCOCCUS FAECALIS 09/11/2019     Lab Results  Component Value Date   ALBUMIN 3.0 (L) 05/07/2020   ALBUMIN 2.9 (L) 05/06/2020   ALBUMIN 3.0 (L) 05/05/2020   LABURIC 3.5 (L) 09/10/2013    Lab Results  Component Value Date   MG 1.7 05/07/2020   MG 1.6 (L) 05/06/2020   MG 1.8 05/05/2020   No results found for: VD25OH  No results found for: PREALBUMIN CBC EXTENDED Latest Ref Rng & Units 07/14/2020 05/07/2020 05/06/2020  WBC 3.8 - 10.8 Thousand/uL 7.2 5.5 5.6  RBC 4.20 - 5.80 Million/uL 4.11(L) 3.54(L) 3.41(L)  HGB 13.2 - 17.1 g/dL 12.2(L) 10.2(L) 10.1(L)  HCT 38.5 - 50.0 % 36.7(L) 32.1(L) 31.0(L)  PLT 140 - 400 Thousand/uL 121(L) 133(L) 129(L)  NEUTROABS 1,500 - 7,800 cells/uL 4,694 3.0 3.2  LYMPHSABS 850 - 3,900 cells/uL 1,390 1.6 1.6     There is no height or weight on file to calculate BMI.  Orders:  No orders of the defined types were placed in this encounter.  No orders of the defined types were placed in this encounter.  Procedures: No procedures performed  Clinical Data: No additional findings.  ROS:  All other systems negative, except as noted in the HPI. Review of Systems  Respiratory: Negative for chest tightness and shortness of breath.   Cardiovascular: Positive for leg swelling.  Skin: Positive for wound. Negative for color change.    Objective: Vital Signs: There were no vitals taken for this visit.  Specialty Comments:  No specialty comments available.  PMFS History: Patient Active Problem List   Diagnosis Date Noted  . Rhinitis 07/14/2020  . Mitral valve vegetation 05/28/2020  . Non-pressure chronic ulcer of other part of left foot limited to breakdown of skin ([place])   . Esophageal dysphagia   . Bacteremia   .  Endocarditis of mitral valve   . Fever 04/28/2020  . Cervical lymphadenopathy 03/15/2020  . 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 ICD (implantable cardioverter-defibrillator) infection ([place]) 12/22/2019  . Gram-positive bacteremia 11/27/2019  . FUO (fever of unknown origin) 11/26/2019  . Low blood pressure reading 11/05/2019  . Wound dehiscence 10/22/2019  . PFO (patent foramen ovale) 10/06/2019  . ICD (implantable cardioverter-defibrillator) battery depletion   . Pressure injury of skin 09/13/2019  . Enterococcal bacteremia history  09/13/2019  . 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  . 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 Urinary retention 09/13/2019  . Phimosis 09/13/2019  . SIRS (systemic inflammatory response syndrome) ([place]) 09/12/2019  . Sepsis ([place]) 09/12/2019  . Enterococcus UTI 09/12/2019  . Cellulitis of foot   . History of transmetatarsal amputation of left foot ([place])   . Subacute osteomyelitis, left ankle and foot ([place])   . 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 ([place]) 04/17/2018  . Diabetic neuropathy, painful ([place]) 12/26/2016  . Neuropathic pain of foot, left 06/20/2016  . 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 Status post amputation of left great toe ([place]) 06/08/2015  . Spinal stenosis of lumbar region 02/16/2015  . Hereditary and idiopathic peripheral neuropathy 01/12/2015  . Oral thrush 12/28/2014  . Orthostatic dizziness 05/24/2014  . Paresthesias with subjective weakness 01/30/2014  . Bilateral thigh pain 01/30/2014  . Abdominal wall pain in right upper quadrant 01/22/2014  . Gross hematuria 09/25/2013  . Intertrigo 09/25/2013  . IBS (irritable bowel syndrome) 09/11/2013  . Chronic pain syndrome 08/06/2013  . CAD (coronary artery disease), native coronary artery 12/25/2012  . Paroxysmal atrial fibrillation ([place]) 12/25/2012  . Moderate aortic  stenosis 12/25/2012  . Aortic stenosis 12/24/2012  . Personal history of colonic adenoma 10/30/2012  . Diverticulosis of colon (without mention of hemorrhage) 10/30/2012  . Internal hemorrhoids 10/30/2012  . Change in bowel habits intermittent loose stools 10/30/2012  . Routine health maintenance 06/09/2012  . Hereditary peripheral neuropathy 10/10/2011  . Long term current use of anticoagulant - apixiban 08/16/2010  . Dyspnea on exertion 09/08/2009  . HYPERCHOLESTEROLEMIA-PURE 01/13/2009  . Essential hypertension, benign 01/13/2009  . EDEMA 01/13/2009  . GOUT 01/12/2009  . GLAUCOMA 01/12/2009  . Venous insufficiency (chronic) (peripheral) 01/12/2009  . COPD (chronic obstructive pulmonary disease) ([place]) 01/12/2009  . VENTRAL HERNIA 01/12/2009  . OSTEOARTHRITIS 01/12/2009  . ARTHRITIS 01/12/2009  . DEGENERATIVE DISC DISEASE 01/12/2009  . CATARACT, HX OF 01/12/2009  . HEART MURMUR, HX OF 01/12/2009  . BENIGN PROSTATIC HYPERTROPHY, HX OF 01/12/2009  . Obesity 01/17/2007  . OBSTRUCTIVE SLEEP APNEA 01/17/2007  . ASTHMA 01/17/2007  . GERD 01/17/2007  . HALLUX VALGUS, ACQUIRED 01/17/2007  . Pulmonary hypertension ([place]) 01/17/2007  Past Medical History:  Diagnosis Date  . AICD (automatic cardioverter/defibrillator) present   . Balanitis    +severe phimosis+ buried penis->circ not possible but dorsal slit done 10/2019  . BPH (benign prostatic hypertrophy)    with urinary retention.  Renal u/s 12/04/19 NORMAL KIDNEYS, NORMAL BLADDER, NO HYDRONEPHROSIS  . CAD (coronary artery disease)    Nonobstructive by 12/2012 cath; then 03/2016 he required BMS to RCA ([facility]).  In-stent restenosis on cath 11/01/16, baloon angioplasty successful.  [name] CATARACT, HX OF   . Chronic combined systolic and diastolic CHF (congestive heart failure) ([place]) 05/2016   Ischemic CM.  04/2018 EF 40-45%, grd I DD.  [name] Chronic pain syndrome    Lumbar DDD; chronic neuropathic pain (DM); has spinal stimulator and sees pain  mgmt MD  . Complicated UTI (urinary tract infection) 09/2019   Phimoses, acute urinary retention, entoerococcus UTI, enterococc bacteremia.  F/u blood clx's neg x 5d.    [name] COPD   . Diabetes mellitus type 2 with complications (HCC)    GYJ8H jumped from 5.7% to 6.1% 03/2015---started metformin at that time.  DM 2 dx by fasting gluc criteria 2018.  Has chronic neuropath pain  . Enterococcal bacteremia 09/2019   Phimoses, acute urinary retention, entoerococcus UTI, enterococc bacteremia.  F/u blood clx's neg x 5d.    . Essential hypertension, benign   . Fatty liver 2007   2007 u/s showed fatty liver with hepatosplenomegaly.  2019 repeat u/s->fatty liver but no cirrhosis or hepatosplenomegaly.  [name] GERD (gastroesophageal reflux disease)    + hx of esoph stenosis, +dilation  . Glaucoma   . GOUT   . HH (hiatus hernia)   . HYPERCHOLESTEROLEMIA-PURE   . Hypogonadism male   . ICD (implantable cardioverter-defibrillator) infection ([place]) 12/22/2019  . 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 Tommy Medal)  . Lumbosacral neuritis   . Lumbosacral spondylosis    Lumbar spinal stenosis with neurogenic claudication--contributes to his chronic pain syndrome  . Normal memory function 08/2014   Neuropsychological testing ([facility]): no cognitive impairment or sign of neurodegenerative disorder.  Likely has adjustment d/o with mixed anxiety/depressed features and may benefit from low dose SNRI.    [name] Normocytic anemia 03/2016   Mild-pt needs ferritin and vit B12 level checked (as of 03/22/16). Hb stable 09/2019  . NSTEMI (non-ST elevated myocardial infarction) ([place]) 03/20/2016   BMS to RCA  . Obesity hypoventilation syndrome ([place])   . Orthostatic hypotension   . OSA on CPAP    8 cm H2O  . OSTEOARTHRITIS   . PAF (paroxysmal atrial fibrillation) ([place])   . Paroxysmal atrial fibrillation ([place]) 2003    (?  chronic?) Off anticoag for a while due to falls.  Then apixaban started 12/2014.  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 Ella Bodo): neuropathic pain->diff to treat, failed nucynta, failed spinal stimul trial, oxycontin hs + tramadol + gabap as of 12/2017 f/u Dr. Letta Pate.  . Personal history of colonic adenoma 10/30/2012   Diminutive adenoma, consider repeat 2019 per GI  . PFO (patent foramen ovale) 09/2019   small, with predominately L to R shunt  . Presence of cardiac defibrillator 11/07/2017  . Primary osteoarthritis of both knees    Bone on bone of medial compartments, + signif patellofemoral arth bilat.--supartz inj series started 09/12/17  . 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 PUD (peptic ulcer disease)   .  PULMONARY HYPERTENSION, HX OF   . Rhinitis 07/14/2020  . Secondary male hypogonadism 2017  . Sepsis ([place]) 04/29/2020  . 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 Shortness of breath    with exertion: much improved s/p TAVR and treatment for CHF.  . Sick sinus syndrome (HCC)    PPM placed  . Thrombocytopenia ([place]) 2018   HSM on 2007 abd u/s---suspect some mild splenic sequestration chronically.  [name] Unspecified glaucoma(365.9)   . Unspecified hereditary and idiopathic peripheral neuropathy approx age 9   bilat LE's, ? left arm, too.  Feet became progressively numb + left foot pain intermittently.  Pt may be trying a spinal stimulator (as of 05/2015)  . VENOUS INSUFFICIENCY    Being followed by Dr. Sharol Given as of 10/2016 for two R LL venous stasis ulcers/skin tears.  Healed as of 10/30/16 f/u with Dr. Sharol Given.  . VENTRAL HERNIA   . Wound dehiscence 10/22/2019    Family History  Problem Relation Age of Onset  . Hypertension Mother   . Coronary artery disease Mother   . Heart attack Mother   . Neuropathy Mother   . Pulmonary fibrosis Father        asbestosis    Past  Surgical History:  Procedure Laterality Date  . AMPUTATION Left 04/11/2013   Procedure: AMPUTATION DIGIT Left 3rd toe;  Surgeon: [name], MD;  Location: [facility];  Service: Orthopedics;  Laterality: Left;  Left 3rd toe amputation at [facility]  . AMPUTATION Left 08/29/2019   Procedure: LEFT TRANSMETATARSAL AMPUTATION;  Surgeon: [name], MD;  Location: [facility];  Service: Orthopedics;  Laterality: Left;  . BIOPSY  05/04/2020   Procedure: BIOPSY;  Surgeon: Jackquline Denmark, MD;  Location: [facility] ENDOSCOPY;  Service: Endoscopy;;  . [facility]; 03/10/16   1997 Non-obstructive disease.  03/2016 BMS to RCA, with 25% pDiag dz, o/w normal cors per cath 03/07/16.  Cath 11/01/16: in stent restenosis, successful baloon angioplasty.  [name] CARDIAC CATHETERIZATION  12/24/2012   mild < 20% LCx, prox 30% RCA; LVEF 55-65% , moderate pulmonary HTN, moderate AS  . CARDIAC DEFIBRILLATOR PLACEMENT  11/07/2017   [facility] MRI Quad CRT defibrillator  . CARDIOVASCULAR STRESS TEST  05/11/16 ([facility])   2017 Myocardial perfusion imaging:  No ischemia; scar in apex, global hypokinesis, EF 36%.  06/15/20->mod primarily fixed inferolat wall defect mildly worse with stress c/w infarct/scar with mild peri-infarct ischemia, normal EF-->for cath per cards.  . Carotid dopplers  03/09/2016   [facility]: no hemodynamically significant stenosis on either side.  . CHOLECYSTECTOMY    . COLONOSCOPY [name] 10/30/2012   Procedure: COLONOSCOPY;  Surgeon: [name], MD;  Location: [facility] ENDOSCOPY;  Service: Endoscopy;  Laterality: [name];  . CORONARY ANGIOPLASTY WITH STENT PLACEMENT  03/2016; 04/2017   2017-[facility]: BMS to RCA-pt was placed on Brilinta.  04/2017: DES to RCA.  . DORSAL SLIT [name] 10/29/2019   for severe phimosis. Procedure: DORSAL SLIT;  Surgeon: Alexis Frock, MD;  Location: [facility];  Service: Urology;  Laterality: [name];  24 MINS  . ESOPHAGEAL DILATION  05/04/2020   Procedure: ESOPHAGEAL DILATION;  Surgeon: Jackquline Denmark, MD;  Location:  [facility]OSCOPY;  Service: Endoscopy;;  . ESOPHAGOGASTRODUODENOSCOPY (EGD) WITH PROPOFOL [name] 05/04/2020   Procedure: ESOPHAGOGASTRODUODENOSCOPY (EGD) WITH PROPOFOL;  Surgeon: Jackquline Denmark, MD;  Location: [facility] ENDOSCOPY;  Service: Endoscopy;  Laterality: [name];  . EYE SURGERY Bilateral cataract  .  HEMORRHOID SURGERY    . INTRAOCULAR LENS INSERTION Bilateral   . KNEE SURGERY Right   . LEFT AND RIGHT HEART CATHETERIZATION WITH CORONARY ANGIOGRAM [name] 12/24/2012   Procedure: LEFT AND RIGHT HEART CATHETERIZATION WITH CORONARY ANGIOGRAM;  Surgeon: [name], MD;  Location: [facility] CATH LAB;  Service: Cardiovascular;  Laterality: [name];  . LEG SURGERY Bilateral    lenghtening   . PACEMAKER PLACEMENT  04/13/2016   2nd deg HB after TAVR, pt had DC MDT PPM placed.  [name] SHOULDER ARTHROSCOPY  08/30/2011   Procedure: ARTHROSCOPY SHOULDER;  Surgeon: [name], MD;  Location: [facility];  Service: Orthopedics;  Laterality: Right;  Right Shoulder Arthroscopy, Debridement, and Decompression  . SPINAL CORD STIMULATOR INSERTION [name] 09/10/2015   Procedure: LUMBAR SPINAL CORD STIMULATOR INSERTION;  Surgeon: [name], MD;  Location: [facility];  Service: Neurosurgery;  Laterality: [name];  . TEE WITHOUT CARDIOVERSION [name] 09/16/2019   Procedure: TRANSESOPHAGEAL ECHOCARDIOGRAM (TEE);  Surgeon: Skeet Latch, MD;  Location: [facility];  Service: Cardiovascular;  Laterality: [name];  . TEE WITHOUT CARDIOVERSION [name] 12/02/2019   +vegetation on AVR and pacer lead in RV.  EF 55-60%, normal wall motion.  Valves function normal.  Procedure: TRANSESOPHAGEAL ECHOCARDIOGRAM (TEE);  Surgeon: Geralynn Rile, MD;  Location: [facility];  Service: Cardiovascular;  Laterality: [name];  . 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  R big toe surg due to osteoarth  . TONSILLECTOMY    . traeculectomy Left    eye  . TRANSCATHETER AORTIC VALVE REPLACEMENT, TRANSFEMORAL  04/11/2016  . TRANSESOPHAGEAL ECHOCARDIOGRAM  03/09/2016; 09/2019    [facility]: EF 55-60%, PFO seen with bi-directional shunting, no thrombus in appendage.  09/2019 ->no valvular vegetations. Small patent foramen ovale with predominantly left to right shunting across the interatrial septum.  . TRANSTHORACIC ECHOCARDIOGRAM  01/2015; 01/2016; 05/18/16; 09/18/16, 05/2017, 08/2017   01/2015 No signif change in aortic stenosis (moderate).  01/2016 Severe LVH w/small LV cavity, EF 60-65%, grade I diast dysfxn.  05/2016 (s/p TAVR): EF 50-55%, grd I DD, biopros AV good.  08/2016--EF 50-55%, LV septal motion c/w conduction abnl, grd I DD,mild MS,bioprosth aortic valve well seated, w/trace AR. 05/2017 TTE EF 35%. 08/2017-EF 35%, mod diff hypokin LV, grd I DD, biopros AV good.   . TRANSTHORACIC ECHOCARDIOGRAM  04/2018; 09/2019   04/2018: EF 40-45%, mod diffuse LV hypokinesis, grd I DD, bioprosth AV well seated, no AS or AR. 09/2019 EF 60-65%, grd I DD, valves fine, including bioprosth AV. 04/29/20 (tech diff) EF 55-60%, grd I DD, vegetation on MV.  [name] VITRECTOMY     Social History   Occupational History  . Occupation: Engineer, production    Comment: retired  Tobacco Use  . Smoking status: Never Smoker  . Smokeless tobacco: Never Used  Vaping Use  . Vaping Use: Never used  Substance and Sexual Activity  . Alcohol use: No    Alcohol/week: 0.0 standard drinks  . Drug use: No    Types: Oxycodone  . Sexual activity: Not Currently

## 2020-09-27 ENCOUNTER — Other Ambulatory Visit: Payer: Self-pay | Admitting: Family Medicine

## 2020-10-05 ENCOUNTER — Other Ambulatory Visit: Payer: Self-pay | Admitting: Family Medicine

## 2020-10-06 ENCOUNTER — Ambulatory Visit (INDEPENDENT_AMBULATORY_CARE_PROVIDER_SITE_OTHER): Payer: Medicare Other | Admitting: Family

## 2020-10-06 ENCOUNTER — Encounter: Payer: Self-pay | Admitting: Family

## 2020-10-06 DIAGNOSIS — Z89432 Acquired absence of left foot: Secondary | ICD-10-CM

## 2020-10-06 DIAGNOSIS — L97521 Non-pressure chronic ulcer of other part of left foot limited to breakdown of skin: Secondary | ICD-10-CM

## 2020-10-06 NOTE — Progress Notes (Signed)
Office Visit Note   Patient: Tim [name]           Date of Birth: 04-Nov-1940           MRN: [id number] Visit Date: 10/06/2020              Requested by: Tammi Sou, MD [address] [place],  [place] [address] PCP: Tammi Sou, MD  Chief Complaint  [name]    08/29/19 left transmet amputation       HPI: The patient is an 80 year old gentleman who comes in today in follow-up for ulcer to his left transmetatarsal amputation.  He has a history of venous insufficiency with chronic ulceration over his transmetatarsal amputation.  Chronic swelling to his left residual limb he has tried using the medical compression stockings in the past.  He has been using CircAid wraps   Assessment & Plan: Visit Diagnoses:  No diagnosis found.  Plan: He will continue with his CircAid wraps. Continue with daily wound cleansing and dry dressings.  Follow-Up Instructions: Return in about 3 weeks (around 10/27/2020).   Ortho Exam  [name], oriented, no adenopathy, well-dressed, normal affect, normal respiratory effort. Examination patient has a strong palpable dorsalis pedis pulse he has significant swelling of the left foot.  Ulcer to the distal aspect of his transmetatarsal amputation.  This is 1.5 cm x 8 mm, was debrided of nonviable tissue and callus after debridement. this is 3 mm deep with no probing or tunneling no odor no drainage no fluctuance no clinical signs of abscess.   Imaging: No results found. No images are attached to the encounter.  Labs: Lab Results  Component Value Date   HGBA1C 6.1 (A) 08/02/2020   HGBA1C 6.1 08/02/2020   HGBA1C 6.1 08/02/2020   HGBA1C 6.1 08/02/2020   ESRSEDRATE 38 (H) 07/14/2020   ESRSEDRATE 20 02/09/2016   ESRSEDRATE 36 (H) 01/28/2014   CRP 0.9 07/14/2020   CRP 0.7 05/03/2020   CRP 1.0 (H) 05/02/2020   LABURIC 3.5 (L) 09/10/2013   REPTSTATUS 05/04/2020 FINAL 04/28/2020   CULT   04/28/2020    NO GROWTH 5 DAYS Performed at [facility], [place] [address] [place], [place] [address]    LABORGA ENTEROCOCCUS FAECALIS 09/11/2019     Lab Results  Component Value Date   ALBUMIN 3.0 (L) 05/07/2020   ALBUMIN 2.9 (L) 05/06/2020   ALBUMIN 3.0 (L) 05/05/2020   LABURIC 3.5 (L) 09/10/2013    Lab Results  Component Value Date   MG 1.7 05/07/2020   MG 1.6 (L) 05/06/2020   MG 1.8 05/05/2020   No results found for: VD25OH  No results found for: PREALBUMIN CBC EXTENDED Latest Ref Rng & Units 07/14/2020 05/07/2020 05/06/2020  WBC 3.8 - 10.8 Thousand/uL 7.2 5.5 5.6  RBC 4.20 - 5.80 Million/uL 4.11(L) 3.54(L) 3.41(L)  HGB 13.2 - 17.1 g/dL 12.2(L) 10.2(L) 10.1(L)  HCT 38.5 - 50.0 % 36.7(L) 32.1(L) 31.0(L)  PLT 140 - 400 Thousand/uL 121(L) 133(L) 129(L)  NEUTROABS 1,500 - 7,800 cells/uL 4,694 3.0 3.2  LYMPHSABS 850 - 3,900 cells/uL 1,390 1.6 1.6     There is no height or weight on file to calculate BMI.  Orders:  No orders of the defined types were placed in this encounter.  No orders of the defined types were placed in this encounter.    Procedures: No procedures performed  Clinical Data: No additional findings.  ROS:  All other systems negative, except as noted in the HPI. Review of Systems  Respiratory: Negative for chest tightness and shortness of breath.   Cardiovascular: Positive for leg swelling.  Skin: Positive for wound. Negative for color change.    Objective: Vital Signs: There were no vitals taken for this visit.  Specialty Comments:  No specialty comments available.  PMFS History: Patient Active Problem List   Diagnosis Date Noted  . Rhinitis 07/14/2020  . Mitral valve vegetation 05/28/2020  . Non-pressure chronic ulcer of other part of left foot limited to breakdown of skin ([place])   . Esophageal dysphagia   . Bacteremia   . Endocarditis of mitral valve   . Fever 04/28/2020  . Cervical lymphadenopathy 03/15/2020  . 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 ICD (implantable cardioverter-defibrillator) infection ([place]) 12/22/2019  . Gram-positive bacteremia 11/27/2019  . FUO (fever of unknown origin) 11/26/2019  . Low blood pressure reading 11/05/2019  . Wound dehiscence 10/22/2019  . PFO (patent foramen ovale) 10/06/2019  . ICD (implantable cardioverter-defibrillator) battery depletion   . Pressure injury of skin 09/13/2019  . Enterococcal bacteremia history  09/13/2019  . 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  . 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 Urinary retention 09/13/2019  . Phimosis 09/13/2019  . SIRS (systemic inflammatory response syndrome) ([place]) 09/12/2019  . Sepsis ([place]) 09/12/2019  . Enterococcus UTI 09/12/2019  . Cellulitis of foot   . History of transmetatarsal amputation of left foot ([place])   . Subacute osteomyelitis, left ankle and foot ([place])   . 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 ([place]) 04/17/2018  . Diabetic neuropathy, painful ([place]) 12/26/2016  . Neuropathic pain of foot, left 06/20/2016  . 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 Status post amputation of left great toe ([place]) 06/08/2015  . Spinal stenosis of lumbar region 02/16/2015  . Hereditary and idiopathic peripheral neuropathy 01/12/2015  . Oral thrush 12/28/2014  . Orthostatic dizziness 05/24/2014  . Paresthesias with subjective weakness 01/30/2014  . Bilateral thigh pain 01/30/2014  . Abdominal wall pain in right upper quadrant 01/22/2014  . Gross hematuria 09/25/2013  . Intertrigo 09/25/2013  . IBS (irritable bowel syndrome) 09/11/2013  . Chronic pain syndrome 08/06/2013  . CAD (coronary artery disease), native coronary artery 12/25/2012  . Paroxysmal atrial fibrillation ([place]) 12/25/2012  . Moderate aortic stenosis 12/25/2012  . Aortic stenosis 12/24/2012  . Personal history of colonic adenoma 10/30/2012  .  Diverticulosis of colon (without mention of hemorrhage) 10/30/2012  . Internal hemorrhoids 10/30/2012  . Change in bowel habits intermittent loose stools 10/30/2012  . Routine health maintenance 06/09/2012  . Hereditary peripheral neuropathy 10/10/2011  . Long term current use of anticoagulant - apixiban 08/16/2010  . Dyspnea on exertion 09/08/2009  . HYPERCHOLESTEROLEMIA-PURE 01/13/2009  . Essential hypertension, benign 01/13/2009  . EDEMA 01/13/2009  . GOUT 01/12/2009  . GLAUCOMA 01/12/2009  . Venous insufficiency (chronic) (peripheral) 01/12/2009  . COPD (chronic obstructive pulmonary disease) ([place]) 01/12/2009  . VENTRAL HERNIA 01/12/2009  . OSTEOARTHRITIS 01/12/2009  . ARTHRITIS 01/12/2009  . DEGENERATIVE DISC DISEASE 01/12/2009  . CATARACT, HX OF 01/12/2009  . HEART MURMUR, HX OF 01/12/2009  . BENIGN PROSTATIC HYPERTROPHY, HX OF 01/12/2009  . Obesity 01/17/2007  . OBSTRUCTIVE SLEEP APNEA 01/17/2007  . ASTHMA 01/17/2007  . GERD 01/17/2007  . HALLUX VALGUS, ACQUIRED 01/17/2007  . Pulmonary hypertension ([place]) 01/17/2007   Past Medical History:  Diagnosis Date  . AICD (automatic cardioverter/defibrillator)  present   . Balanitis    +severe phimosis+ buried penis->circ not possible but dorsal slit done 10/2019  . BPH (benign prostatic hypertrophy)    with urinary retention.  Renal u/s 12/04/19 NORMAL KIDNEYS, NORMAL BLADDER, NO HYDRONEPHROSIS  . CAD (coronary artery disease)    Nonobstructive by 12/2012 cath; then 03/2016 he required BMS to RCA ([facility]).  In-stent restenosis on cath 11/01/16, baloon angioplasty successful.  [name] CATARACT, HX OF   . Chronic combined systolic and diastolic CHF (congestive heart failure) ([place]) 05/2016   Ischemic CM.  04/2018 EF 40-45%, grd I DD.  [name] Chronic pain syndrome    Lumbar DDD; chronic neuropathic pain (DM); has spinal stimulator and sees pain mgmt MD  . Complicated UTI (urinary tract infection) 09/2019   Phimoses, acute urinary retention,  entoerococcus UTI, enterococc bacteremia.  F/u blood clx's neg x 5d.    [name] COPD   . Diabetes mellitus type 2 with complications (HCC)    EHO1Y jumped from 5.7% to 6.1% 03/2015---started metformin at that time.  DM 2 dx by fasting gluc criteria 2018.  Has chronic neuropath pain  . Enterococcal bacteremia 09/2019   Phimoses, acute urinary retention, entoerococcus UTI, enterococc bacteremia.  F/u blood clx's neg x 5d.    . Essential hypertension, benign   . Fatty liver 2007   2007 u/s showed fatty liver with hepatosplenomegaly.  2019 repeat u/s->fatty liver but no cirrhosis or hepatosplenomegaly.  [name] GERD (gastroesophageal reflux disease)    + hx of esoph stenosis, +dilation  . Glaucoma   . GOUT   . HH (hiatus hernia)   . HYPERCHOLESTEROLEMIA-PURE   . Hypogonadism male   . ICD (implantable cardioverter-defibrillator) infection ([place]) 12/22/2019  . 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 Tommy Medal)  . Lumbosacral neuritis   . Lumbosacral spondylosis    Lumbar spinal stenosis with neurogenic claudication--contributes to his chronic pain syndrome  . Normal memory function 08/2014   Neuropsychological testing ([facility]): no cognitive impairment or sign of neurodegenerative disorder.  Likely has adjustment d/o with mixed anxiety/depressed features and may benefit from low dose SNRI.    [name] Normocytic anemia 03/2016   Mild-pt needs ferritin and vit B12 level checked (as of 03/22/16). Hb stable 09/2019  . NSTEMI (non-ST elevated myocardial infarction) ([place]) 03/20/2016   BMS to RCA  . Obesity hypoventilation syndrome ([place])   . Orthostatic hypotension   . OSA on CPAP    8 cm H2O  . OSTEOARTHRITIS   . PAF (paroxysmal atrial fibrillation) ([place])   . Paroxysmal atrial fibrillation ([place]) 2003    (? chronic?) Off anticoag for a while due to falls.  Then apixaban started 12/2014.  [name] Peripheral  neuropathy    DPN (+[facility]; with chronic neuropathic pain--Dr. Ella Bodo): neuropathic pain->diff to treat, failed nucynta, failed spinal stimul trial, oxycontin hs + tramadol + gabap as of 12/2017 f/u Dr. Letta Pate.  . Personal history of colonic adenoma 10/30/2012   Diminutive adenoma, consider repeat 2019 per GI  . PFO (patent foramen ovale) 09/2019   small, with predominately L to R shunt  . Presence of cardiac defibrillator 11/07/2017  . Primary osteoarthritis of both knees    Bone on bone of medial compartments, + signif patellofemoral arth bilat.--supartz inj series started 09/12/17  . 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 PUD (peptic ulcer disease)   . PULMONARY HYPERTENSION, HX OF   . Rhinitis 07/14/2020  .  Secondary male hypogonadism 2017  . Sepsis ([place]) 04/29/2020  . 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 Shortness of breath    with exertion: much improved s/p TAVR and treatment for CHF.  . Sick sinus syndrome (HCC)    PPM placed  . Thrombocytopenia ([place]) 2018   HSM on 2007 abd u/s---suspect some mild splenic sequestration chronically.  [name] Unspecified glaucoma(365.9)   . Unspecified hereditary and idiopathic peripheral neuropathy approx age 41   bilat LE's, ? left arm, too.  Feet became progressively numb + left foot pain intermittently.  Pt may be trying a spinal stimulator (as of 05/2015)  . VENOUS INSUFFICIENCY    Being followed by Dr. Sharol Given as of 10/2016 for two R LL venous stasis ulcers/skin tears.  Healed as of 10/30/16 f/u with Dr. Sharol Given.  . VENTRAL HERNIA   . Wound dehiscence 10/22/2019    Family History  Problem Relation Age of Onset  . Hypertension Mother   . Coronary artery disease Mother   . Heart attack Mother   . Neuropathy Mother   . Pulmonary fibrosis Father        asbestosis    Past Surgical History:  Procedure Laterality Date  . AMPUTATION Left 04/11/2013   Procedure:  AMPUTATION DIGIT Left 3rd toe;  Surgeon: [name], MD;  Location: [facility];  Service: Orthopedics;  Laterality: Left;  Left 3rd toe amputation at [facility]  . AMPUTATION Left 08/29/2019   Procedure: LEFT TRANSMETATARSAL AMPUTATION;  Surgeon: [name], MD;  Location: [facility];  Service: Orthopedics;  Laterality: Left;  . BIOPSY  05/04/2020   Procedure: BIOPSY;  Surgeon: Jackquline Denmark, MD;  Location: [facility] ENDOSCOPY;  Service: Endoscopy;;  . [place]; 03/10/16   1997 Non-obstructive disease.  03/2016 BMS to RCA, with 25% pDiag dz, o/w normal cors per cath 03/07/16.  Cath 11/01/16: in stent restenosis, successful baloon angioplasty.  [name] CARDIAC CATHETERIZATION  12/24/2012   mild < 20% LCx, prox 30% RCA; LVEF 55-65% , moderate pulmonary HTN, moderate AS  . CARDIAC DEFIBRILLATOR PLACEMENT  11/07/2017   [facility] MRI Quad CRT defibrillator  . CARDIOVASCULAR STRESS TEST  05/11/16 ([facility])   2017 Myocardial perfusion imaging:  No ischemia; scar in apex, global hypokinesis, EF 36%.  06/15/20->mod primarily fixed inferolat wall defect mildly worse with stress c/w infarct/scar with mild peri-infarct ischemia, normal EF-->for cath per cards.  . Carotid dopplers  03/09/2016   [facility]: no hemodynamically significant stenosis on either side.  . CHOLECYSTECTOMY    . COLONOSCOPY [name] 10/30/2012   Procedure: COLONOSCOPY;  Surgeon: [name], MD;  Location: [facility] ENDOSCOPY;  Service: Endoscopy;  Laterality: [name];  . CORONARY ANGIOPLASTY WITH STENT PLACEMENT  03/2016; 04/2017   2017-[facility]: BMS to RCA-pt was placed on Brilinta.  04/2017: DES to RCA.  . DORSAL SLIT [name] 10/29/2019   for severe phimosis. Procedure: DORSAL SLIT;  Surgeon: Alexis Frock, MD;  Location: [facility] ORS;  Service: Urology;  Laterality: [name];  73 MINS  . ESOPHAGEAL DILATION  05/04/2020   Procedure: ESOPHAGEAL DILATION;  Surgeon: Jackquline Denmark, MD;  Location: [facility]COPY;  Service: Endoscopy;;  . ESOPHAGOGASTRODUODENOSCOPY (EGD) WITH PROPOFOL [name]  05/04/2020   Procedure: ESOPHAGOGASTRODUODENOSCOPY (EGD) WITH PROPOFOL;  Surgeon: Jackquline Denmark, MD;  Location: [facility] ENDOSCOPY;  Service: Endoscopy;  Laterality: [name];  . EYE SURGERY Bilateral cataract  . HEMORRHOID SURGERY    . INTRAOCULAR LENS INSERTION Bilateral   .  KNEE SURGERY Right   . LEFT AND RIGHT HEART CATHETERIZATION WITH CORONARY ANGIOGRAM [name] 12/24/2012   Procedure: LEFT AND RIGHT HEART CATHETERIZATION WITH CORONARY ANGIOGRAM;  Surgeon: [name], MD;  Location[facility] CATH LAB;  Service: Cardiovascular;  Laterality: [name];  . LEG SURGERY Bilateral    lenghtening   . PACEMAKER PLACEMENT  04/13/2016   2nd deg HB after TAVR, pt had DC MDT PPM placed.  [name] SHOULDER ARTHROSCOPY  08/30/2011   Procedure: ARTHROSCOPY SHOULDER;  Surgeon: [name], MD;  Location: [facility];  Service: Orthopedics;  Laterality: Right;  Right Shoulder Arthroscopy, Debridement, and Decompression  . SPINAL CORD STIMULATOR INSERTION [name] 09/10/2015   Procedure: LUMBAR SPINAL CORD STIMULATOR INSERTION;  Surgeon: [name], MD;  Location: [facility]URO ORS;  Service: Neurosurgery;  Laterality: [name];  . TEE WITHOUT CARDIOVERSION [name] 09/16/2019   Procedure: TRANSESOPHAGEAL ECHOCARDIOGRAM (TEE);  Surgeon: Skeet Latch, MD;  Location: [facility];  Service: Cardiovascular;  Laterality: [name];  . TEE WITHOUT CARDIOVERSION [name] 12/02/2019   +vegetation on AVR and pacer lead in RV.  EF 55-60%, normal wall motion.  Valves function normal.  Procedure: TRANSESOPHAGEAL ECHOCARDIOGRAM (TEE);  Surgeon: Geralynn Rile, MD;  Location: [facility];  Service: Cardiovascular;  Laterality: [name];  . 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  R big toe surg due to osteoarth  . TONSILLECTOMY    . traeculectomy Left    eye  . TRANSCATHETER AORTIC VALVE REPLACEMENT, TRANSFEMORAL  04/11/2016  . TRANSESOPHAGEAL ECHOCARDIOGRAM  03/09/2016; 09/2019   [facility]: EF 55-60%, PFO seen with bi-directional shunting, no thrombus in appendage.  09/2019  ->no valvular vegetations. Small patent foramen ovale with predominantly left to right shunting across the interatrial septum.  . TRANSTHORACIC ECHOCARDIOGRAM  01/2015; 01/2016; 05/18/16; 09/18/16, 05/2017, 08/2017   01/2015 No signif change in aortic stenosis (moderate).  01/2016 Severe LVH w/small LV cavity, EF 60-65%, grade I diast dysfxn.  05/2016 (s/p TAVR): EF 50-55%, grd I DD, biopros AV good.  08/2016--EF 50-55%, LV septal motion c/w conduction abnl, grd I DD,mild MS,bioprosth aortic valve well seated, w/trace AR. 05/2017 TTE EF 35%. 08/2017-EF 35%, mod diff hypokin LV, grd I DD, biopros AV good.   . TRANSTHORACIC ECHOCARDIOGRAM  04/2018; 09/2019   04/2018: EF 40-45%, mod diffuse LV hypokinesis, grd I DD, bioprosth AV well seated, no AS or AR. 09/2019 EF 60-65%, grd I DD, valves fine, including bioprosth AV. 04/29/20 (tech diff) EF 55-60%, grd I DD, vegetation on MV.  [name] VITRECTOMY     Social History   Occupational History  . Occupation: Engineer, production    Comment: retired  Tobacco Use  . Smoking status: Never Smoker  . Smokeless tobacco: Never Used  Vaping Use  . Vaping Use: Never used  Substance and Sexual Activity  . Alcohol use: No    Alcohol/week: 0.0 standard drinks  . Drug use: No    Types: Oxycodone  . Sexual activity: Not Currently

## 2020-10-10 ENCOUNTER — Other Ambulatory Visit: Payer: Self-pay | Admitting: Family Medicine

## 2020-10-27 ENCOUNTER — Ambulatory Visit (INDEPENDENT_AMBULATORY_CARE_PROVIDER_SITE_OTHER): Payer: Medicare Other | Admitting: Orthopedic Surgery

## 2020-10-27 ENCOUNTER — Encounter: Payer: Self-pay | Admitting: Physician Assistant

## 2020-10-27 ENCOUNTER — Other Ambulatory Visit: Payer: Self-pay | Admitting: Family Medicine

## 2020-10-27 DIAGNOSIS — L97521 Non-pressure chronic ulcer of other part of left foot limited to breakdown of skin: Secondary | ICD-10-CM

## 2020-10-27 MED ORDER — CADEXOMER IODINE 0.9 % EX GEL: 1.0000 "application " | Freq: Every day | CUTANEOUS | 3 refills | Status: DC | PRN

## 2020-10-27 NOTE — Progress Notes (Signed)
Office Visit Note   Patient: Tim [name]           Date of Birth: 1940-08-18           MRN: [id number] Visit Date: 10/27/2020              Requested by: Tammi Sou, MD [address] [place],  [place] [address] PCP: Tammi Sou, MD  Chief Complaint  [name]    08/29/19 left transmet amputation       HPI: Patient is an 80 year old gentleman who is seen in follow-up for Wagner grade 1 ulcer over the end of his transmetatarsal amputation patient is currently wearing CircAid stockings for his venous and lymphatic swelling.  Assessment & Plan: Visit Diagnoses:  1. Non-pressure chronic ulcer of other part of left foot limited to breakdown of skin ([place])     Plan: Continue with his current compression continue with Iodosorb dressing changes a new prescription was sent in today.  Follow-Up Instructions: Return in about 3 weeks (around 11/17/2020).   Ortho Exam  [name], oriented, no adenopathy, well-dressed, normal affect, normal respiratory effort. Examination patient has a hypertrophic ulcer over the end of the residual limb.  This is 10 mm in diameter.  After informed consent a 10 blade knife was used to debride skin and soft tissue back to healthy viable granulation tissue.  This was 3 cm in diameter after debridement.  Silver nitrate was used hemostasis Iodosorb dressing was applied.  There is no redness no cellulitis no signs of infection.  The ulcer does not probe to bone or tendon there is no tunneling.  Imaging: No results found. No images are attached to the encounter.  Labs: Lab Results  Component Value Date   HGBA1C 6.1 (A) 08/02/2020   HGBA1C 6.1 08/02/2020   HGBA1C 6.1 08/02/2020   HGBA1C 6.1 08/02/2020   ESRSEDRATE 38 (H) 07/14/2020   ESRSEDRATE 20 02/09/2016   ESRSEDRATE 36 (H) 01/28/2014   CRP 0.9 07/14/2020   CRP 0.7 05/03/2020   CRP 1.0 (H) 05/02/2020   LABURIC 3.5 (L) 09/10/2013    REPTSTATUS 05/04/2020 FINAL 04/28/2020   CULT  04/28/2020    NO GROWTH 5 DAYS Performed at [facility], [place] [address] [place],  [address]    LABORGA ENTEROCOCCUS FAECALIS 09/11/2019     Lab Results  Component Value Date   ALBUMIN 3.0 (L) 05/07/2020   ALBUMIN 2.9 (L) 05/06/2020   ALBUMIN 3.0 (L) 05/05/2020    Lab Results  Component Value Date   MG 1.7 05/07/2020   MG 1.6 (L) 05/06/2020   MG 1.8 05/05/2020   No results found for: VD25OH  No results found for: PREALBUMIN CBC EXTENDED Latest Ref Rng & Units 07/14/2020 05/07/2020 05/06/2020  WBC 3.8 - 10.8 Thousand/uL 7.2 5.5 5.6  RBC 4.20 - 5.80 Million/uL 4.11(L) 3.54(L) 3.41(L)  HGB 13.2 - 17.1 g/dL 12.2(L) 10.2(L) 10.1(L)  HCT 38.5 - 50.0 % 36.7(L) 32.1(L) 31.0(L)  PLT 140 - 400 Thousand/uL 121(L) 133(L) 129(L)  NEUTROABS 1,500 - 7,800 cells/uL 4,694 3.0 3.2  LYMPHSABS 850 - 3,900 cells/uL 1,390 1.6 1.6     There is no height or weight on file to calculate BMI.  Orders:  No orders of the defined types were placed in this encounter.  Meds ordered this encounter  Medications  . cadexomer iodine (IODOSORB) 0.9 % gel    Sig: Apply 1  application topically daily as needed for wound care. Apply to the affected area daily plus dry dressing    Dispense:  40 g    Refill:  3     Procedures: No procedures performed  Clinical Data: No additional findings.  ROS:  All other systems negative, except as noted in the HPI. Review of Systems  Objective: Vital Signs: There were no vitals taken for this visit.  Specialty Comments:  No specialty comments available.  PMFS History: Patient Active Problem List   Diagnosis Date Noted  . Rhinitis 07/14/2020  . Mitral valve vegetation 05/28/2020  . Non-pressure chronic ulcer of other part of left foot limited to breakdown of skin ([place])   . Esophageal dysphagia   . Bacteremia   . Endocarditis of mitral valve   . Fever 04/28/2020  . Cervical lymphadenopathy  03/15/2020  . 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 ICD (implantable cardioverter-defibrillator) infection ([place]) 12/22/2019  . Gram-positive bacteremia 11/27/2019  . FUO (fever of unknown origin) 11/26/2019  . Low blood pressure reading 11/05/2019  . Wound dehiscence 10/22/2019  . PFO (patent foramen ovale) 10/06/2019  . ICD (implantable cardioverter-defibrillator) battery depletion   . Pressure injury of skin 09/13/2019  . Enterococcal bacteremia history  09/13/2019  . 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  . 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 Urinary retention 09/13/2019  . Phimosis 09/13/2019  . SIRS (systemic inflammatory response syndrome) ([place]) 09/12/2019  . Sepsis ([place]) 09/12/2019  . Enterococcus UTI 09/12/2019  . Cellulitis of foot   . History of transmetatarsal amputation of left foot ([place])   . Subacute osteomyelitis, left ankle and foot ([place])   . 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 Diabetic neuropathy, painful ([place]) 12/26/2016  . Neuropathic pain of foot, left 06/20/2016  . 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 Status post amputation of left great toe ([place]) 06/08/2015  . Spinal stenosis of lumbar region 02/16/2015  . Hereditary and idiopathic peripheral neuropathy 01/12/2015  . Oral thrush 12/28/2014  . Orthostatic dizziness 05/24/2014  . Paresthesias with subjective weakness 01/30/2014  . Bilateral thigh pain 01/30/2014  . Abdominal wall pain in right upper quadrant 01/22/2014  . Gross hematuria 09/25/2013  . Intertrigo 09/25/2013  . IBS (irritable bowel syndrome) 09/11/2013  . Chronic pain syndrome 08/06/2013  . CAD (coronary artery disease), native coronary artery 12/25/2012  . Paroxysmal atrial fibrillation ([place]) 12/25/2012  . Moderate aortic stenosis 12/25/2012  . Aortic stenosis 12/24/2012  . Personal history of colonic  adenoma 10/30/2012  . Diverticulosis of colon (without mention of hemorrhage) 10/30/2012  . Internal hemorrhoids 10/30/2012  . Change in bowel habits intermittent loose stools 10/30/2012  . Routine health maintenance 06/09/2012  . Hereditary peripheral neuropathy 10/10/2011  . Long term current use of anticoagulant - apixiban 08/16/2010  . Dyspnea on exertion 09/08/2009  . HYPERCHOLESTEROLEMIA-PURE 01/13/2009  . Essential hypertension, benign 01/13/2009  . EDEMA 01/13/2009  . GOUT 01/12/2009  . GLAUCOMA 01/12/2009  . Venous insufficiency (chronic) (peripheral) 01/12/2009  . COPD (chronic obstructive pulmonary disease) ([place]) 01/12/2009  . VENTRAL HERNIA 01/12/2009  . OSTEOARTHRITIS 01/12/2009  . ARTHRITIS 01/12/2009  . DEGENERATIVE DISC DISEASE 01/12/2009  . CATARACT, HX OF 01/12/2009  . HEART MURMUR, HX OF 01/12/2009  . BENIGN PROSTATIC HYPERTROPHY, HX OF 01/12/2009  . Obesity 01/17/2007  . OBSTRUCTIVE SLEEP APNEA 01/17/2007  . ASTHMA 01/17/2007  . GERD 01/17/2007  . HALLUX VALGUS, ACQUIRED 01/17/2007  .  Pulmonary hypertension ([place]) 01/17/2007   Past Medical History:  Diagnosis Date  . AICD (automatic cardioverter/defibrillator) present   . Balanitis    +severe phimosis+ buried penis->circ not possible but dorsal slit done 10/2019  . BPH (benign prostatic hypertrophy)    with urinary retention.  Renal u/s 12/04/19 NORMAL KIDNEYS, NORMAL BLADDER, NO HYDRONEPHROSIS  . CAD (coronary artery disease)    Nonobstructive by 12/2012 cath; then 03/2016 he required BMS to RCA ([facility]).  In-stent restenosis on cath 11/01/16, baloon angioplasty successful.  [name] CATARACT, HX OF   . Chronic combined systolic and diastolic CHF (congestive heart failure) ([place]) 05/2016   Ischemic CM.  04/2018 EF 40-45%, grd I DD.  [name] Chronic pain syndrome    Lumbar DDD; chronic neuropathic pain (DM); has spinal stimulator and sees pain mgmt MD  . Complicated UTI (urinary tract infection) 09/2019   Phimoses, acute  urinary retention, entoerococcus UTI, enterococc bacteremia.  F/u blood clx's neg x 5d.    [name] COPD   . Diabetes mellitus type 2 with complications (HCC)    OQH4T jumped from 5.7% to 6.1% 03/2015---started metformin at that time.  DM 2 dx by fasting gluc criteria 2018.  Has chronic neuropath pain  . Enterococcal bacteremia 09/2019   Phimoses, acute urinary retention, entoerococcus UTI, enterococc bacteremia.  F/u blood clx's neg x 5d.    . Essential hypertension, benign   . Fatty liver 2007   2007 u/s showed fatty liver with hepatosplenomegaly.  2019 repeat u/s->fatty liver but no cirrhosis or hepatosplenomegaly.  [name] GERD (gastroesophageal reflux disease)    + hx of esoph stenosis, +dilation  . Glaucoma   . GOUT   . HH (hiatus hernia)   . HYPERCHOLESTEROLEMIA-PURE   . Hypogonadism male   . ICD (implantable cardioverter-defibrillator) infection ([place]) 12/22/2019  . 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 Tommy Medal)  . Lumbosacral neuritis   . Lumbosacral spondylosis    Lumbar spinal stenosis with neurogenic claudication--contributes to his chronic pain syndrome  . Normal memory function 08/2014   Neuropsychological testing ([facility]): no cognitive impairment or sign of neurodegenerative disorder.  Likely has adjustment d/o with mixed anxiety/depressed features and may benefit from low dose SNRI.    [name] Normocytic anemia 03/2016   Mild-pt needs ferritin and vit B12 level checked (as of 03/22/16). Hb stable 09/2019  . NSTEMI (non-ST elevated myocardial infarction) ([place]) 03/20/2016   BMS to RCA  . Obesity hypoventilation syndrome ([place])   . Orthostatic hypotension   . OSA on CPAP    8 cm H2O  . OSTEOARTHRITIS   . PAF (paroxysmal atrial fibrillation) ([place])   . Paroxysmal atrial fibrillation ([place]) 2003    (? chronic?) Off anticoag for a while due to falls.  Then apixaban started 12/2014.  [name]  Peripheral neuropathy    DPN (+[facility]; with chronic neuropathic pain--Dr. Ella Bodo): neuropathic pain->diff to treat, failed nucynta, failed spinal stimul trial, oxycontin hs + tramadol + gabap as of 12/2017 f/u Dr. Letta Pate.  . Personal history of colonic adenoma 10/30/2012   Diminutive adenoma, consider repeat 2019 per GI  . PFO (patent foramen ovale) 09/2019   small, with predominately L to R shunt  . Presence of cardiac defibrillator 11/07/2017  . Primary osteoarthritis of both knees    Bone on bone of medial compartments, + signif patellofemoral arth bilat.--supartz inj series started 09/12/17  . 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  PUD (peptic ulcer disease)   . PULMONARY HYPERTENSION, HX OF   . Rhinitis 07/14/2020  . Secondary male hypogonadism 2017  . Sepsis ([place]) 04/29/2020  . 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 Shortness of breath    with exertion: much improved s/p TAVR and treatment for CHF.  . Sick sinus syndrome (HCC)    PPM placed  . Thrombocytopenia ([place]) 2018   HSM on 2007 abd u/s---suspect some mild splenic sequestration chronically.  [name] Unspecified glaucoma(365.9)   . Unspecified hereditary and idiopathic peripheral neuropathy approx age 70   bilat LE's, ? left arm, too.  Feet became progressively numb + left foot pain intermittently.  Pt may be trying a spinal stimulator (as of 05/2015)  . VENOUS INSUFFICIENCY    Being followed by Dr. Sharol Given as of 10/2016 for two R LL venous stasis ulcers/skin tears.  Healed as of 10/30/16 f/u with Dr. Sharol Given.  . VENTRAL HERNIA   . Wound dehiscence 10/22/2019    Family History  Problem Relation Age of Onset  . Hypertension Mother   . Coronary artery disease Mother   . Heart attack Mother   . Neuropathy Mother   . Pulmonary fibrosis Father        asbestosis    Past Surgical History:  Procedure Laterality Date  . AMPUTATION Left 04/11/2013    Procedure: AMPUTATION DIGIT Left 3rd toe;  Surgeon: [name], MD;  Location: [facility];  Service: Orthopedics;  Laterality: Left;  Left 3rd toe amputation at [facility]  . AMPUTATION Left 08/29/2019   Procedure: LEFT TRANSMETATARSAL AMPUTATION;  Surgeon: [name], MD;  Location: [facility];  Service: Orthopedics;  Laterality: Left;  . BIOPSY  05/04/2020   Procedure: BIOPSY;  Surgeon: Jackquline Denmark, MD;  Location: [facility] ENDOSCOPY;  Service: Endoscopy;;  . [place]; 03/10/16   1997 Non-obstructive disease.  03/2016 BMS to RCA, with 25% pDiag dz, o/w normal cors per cath 03/07/16.  Cath 11/01/16: in stent restenosis, successful baloon angioplasty.  [name] CARDIAC CATHETERIZATION  12/24/2012   mild < 20% LCx, prox 30% RCA; LVEF 55-65% , moderate pulmonary HTN, moderate AS  . CARDIAC DEFIBRILLATOR PLACEMENT  11/07/2017   [facility] MRI Quad CRT defibrillator  . CARDIOVASCULAR STRESS TEST  05/11/16 ([facility])   2017 Myocardial perfusion imaging:  No ischemia; scar in apex, global hypokinesis, EF 36%.  06/15/20->mod primarily fixed inferolat wall defect mildly worse with stress c/w infarct/scar with mild peri-infarct ischemia, normal EF-->for cath per cards.  . Carotid dopplers  03/09/2016   [facility]: no hemodynamically significant stenosis on either side.  . CHOLECYSTECTOMY    . COLONOSCOPY [name] 10/30/2012   Procedure: COLONOSCOPY;  Surgeon: [name], MD;  Location: [facility] ENDOSCOPY;  Service: Endoscopy;  Laterality: [name];  . CORONARY ANGIOPLASTY WITH STENT PLACEMENT  03/2016; 04/2017   2017-[facility]: BMS to RCA-pt was placed on Brilinta.  04/2017: DES to RCA.  . DORSAL SLIT [name] 10/29/2019   for severe phimosis. Procedure: DORSAL SLIT;  Surgeon: Alexis Frock, MD;  Location: [facility] ORS;  Service: Urology;  Laterality: [name];  61 MINS  . ESOPHAGEAL DILATION  05/04/2020   Procedure: ESOPHAGEAL DILATION;  Surgeon: Jackquline Denmark, MD;  Location[facility] ENDOSCOPY;  Service: Endoscopy;;  . ESOPHAGOGASTRODUODENOSCOPY (EGD) WITH  PROPOFOL [name] 05/04/2020   Procedure: ESOPHAGOGASTRODUODENOSCOPY (EGD) WITH PROPOFOL;  Surgeon: Jackquline Denmark, MD;  Location: [facility] ENDOSCOPY;  Service: Endoscopy;  Laterality: [name];  .  EYE SURGERY Bilateral cataract  . HEMORRHOID SURGERY    . INTRAOCULAR LENS INSERTION Bilateral   . KNEE SURGERY Right   . LEFT AND RIGHT HEART CATHETERIZATION WITH CORONARY ANGIOGRAM [name] 12/24/2012   Procedure: LEFT AND RIGHT HEART CATHETERIZATION WITH CORONARY ANGIOGRAM;  Surgeon: [name], MD;  Location: [facility] CATH LAB;  Service: Cardiovascular;  Laterality: [name];  . LEG SURGERY Bilateral    lenghtening   . PACEMAKER PLACEMENT  04/13/2016   2nd deg HB after TAVR, pt had DC MDT PPM placed.  [name] SHOULDER ARTHROSCOPY  08/30/2011   Procedure: ARTHROSCOPY SHOULDER;  Surgeon: [name], MD;  Location: [facility];  Service: Orthopedics;  Laterality: Right;  Right Shoulder Arthroscopy, Debridement, and Decompression  . SPINAL CORD STIMULATOR INSERTION [name] 09/10/2015   Procedure: LUMBAR SPINAL CORD STIMULATOR INSERTION;  Surgeon: [name], MD;  Location: [facility] NEURO ORS;  Service: Neurosurgery;  Laterality: [name];  . TEE WITHOUT CARDIOVERSION [name] 09/16/2019   Procedure: TRANSESOPHAGEAL ECHOCARDIOGRAM (TEE);  Surgeon: Skeet Latch, MD;  Location: [facility];  Service: Cardiovascular;  Laterality: [name];  . TEE WITHOUT CARDIOVERSION [name] 12/02/2019   +vegetation on AVR and pacer lead in RV.  EF 55-60%, normal wall motion.  Valves function normal.  Procedure: TRANSESOPHAGEAL ECHOCARDIOGRAM (TEE);  Surgeon: Geralynn Rile, MD;  Location: [facility];  Service: Cardiovascular;  Laterality: [name];  . 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  R big toe surg due to osteoarth  . TONSILLECTOMY    . traeculectomy Left    eye  . TRANSCATHETER AORTIC VALVE REPLACEMENT, TRANSFEMORAL  04/11/2016  . TRANSESOPHAGEAL ECHOCARDIOGRAM  03/09/2016; 09/2019   [facility]: EF 55-60%, PFO seen with bi-directional shunting, no thrombus in  appendage.  09/2019 ->no valvular vegetations. Small patent foramen ovale with predominantly left to right shunting across the interatrial septum.  . TRANSTHORACIC ECHOCARDIOGRAM  01/2015; 01/2016; 05/18/16; 09/18/16, 05/2017, 08/2017   01/2015 No signif change in aortic stenosis (moderate).  01/2016 Severe LVH w/small LV cavity, EF 60-65%, grade I diast dysfxn.  05/2016 (s/p TAVR): EF 50-55%, grd I DD, biopros AV good.  08/2016--EF 50-55%, LV septal motion c/w conduction abnl, grd I DD,mild MS,bioprosth aortic valve well seated, w/trace AR. 05/2017 TTE EF 35%. 08/2017-EF 35%, mod diff hypokin LV, grd I DD, biopros AV good.   . TRANSTHORACIC ECHOCARDIOGRAM  04/2018; 09/2019   04/2018: EF 40-45%, mod diffuse LV hypokinesis, grd I DD, bioprosth AV well seated, no AS or AR. 09/2019 EF 60-65%, grd I DD, valves fine, including bioprosth AV. 04/29/20 (tech diff) EF 55-60%, grd I DD, vegetation on MV.  [name] VITRECTOMY     Social History   Occupational History  . Occupation: Engineer, production    Comment: retired  Tobacco Use  . Smoking status: Never Smoker  . Smokeless tobacco: Never Used  Vaping Use  . Vaping Use: Never used  Substance and Sexual Activity  . Alcohol use: No    Alcohol/week: 0.0 standard drinks  . Drug use: No    Types: Oxycodone  . Sexual activity: Not Currently

## 2020-10-28 ENCOUNTER — Other Ambulatory Visit: Payer: Self-pay

## 2020-10-28 MED ORDER — CADEXOMER IODINE 0.9 % EX GEL: 1.0000 "application " | Freq: Every day | CUTANEOUS | 3 refills | Status: DC | PRN

## 2020-10-30 ENCOUNTER — Other Ambulatory Visit: Payer: Self-pay | Admitting: Family Medicine

## 2020-11-01 ENCOUNTER — Other Ambulatory Visit: Payer: Self-pay

## 2020-11-01 ENCOUNTER — Encounter: Payer: Self-pay | Admitting: Family Medicine

## 2020-11-01 ENCOUNTER — Ambulatory Visit: Payer: Medicare Other | Admitting: Family Medicine

## 2020-11-01 VITALS — BP 87/50 | HR 66 | Temp 98.0°F | Resp 16 | Ht 72.0 in

## 2020-11-01 DIAGNOSIS — R031 Nonspecific low blood-pressure reading: Secondary | ICD-10-CM

## 2020-11-01 DIAGNOSIS — E1149 Type 2 diabetes mellitus with other diabetic neurological complication: Secondary | ICD-10-CM

## 2020-11-01 DIAGNOSIS — I10 Essential (primary) hypertension: Secondary | ICD-10-CM | POA: Diagnosis not present

## 2020-11-01 DIAGNOSIS — I2510 Atherosclerotic heart disease of native coronary artery without angina pectoris: Secondary | ICD-10-CM

## 2020-11-01 DIAGNOSIS — I480 Paroxysmal atrial fibrillation: Secondary | ICD-10-CM

## 2020-11-01 DIAGNOSIS — R5381 Other malaise: Secondary | ICD-10-CM

## 2020-11-01 LAB — BASIC METABOLIC PANEL
BUN: 15 (ref 4–21)
CO2: 26 — AB (ref 13–22)
Chloride: 105 (ref 99–108)
Creatinine: 0.9 (ref <=1.3)
Glucose: 127
Potassium: 4.6 (ref 3.4–5.3)
Sodium: 141 (ref 137–147)

## 2020-11-01 LAB — COMPREHENSIVE METABOLIC PANEL
Albumin: 3.6 (ref 3.5–5.0)
Calcium: 8.7 (ref 8.7–10.7)
GFR calc non Af Amer: 88
Globulin: 2.6

## 2020-11-01 LAB — POCT GLYCOSYLATED HEMOGLOBIN (HGB A1C)
HbA1c POC (<> result, manual entry): 5.5 % (ref 4.0–5.6)
HbA1c, POC (controlled diabetic range): 5.5 % (ref 0.0–7.0)
HbA1c, POC (prediabetic range): 5.5 % — AB (ref 5.7–6.4)
Hemoglobin A1C: 5.5 % (ref 4.0–5.6)

## 2020-11-01 LAB — HEPATIC FUNCTION PANEL
ALT: 35 (ref 10–40)
AST: 39 (ref 14–40)
Alkaline Phosphatase: 75 (ref 25–125)
Bilirubin, Total: 0.6

## 2020-11-01 LAB — LIPID PANEL
Cholesterol: 115 (ref 0–200)
HDL: 62 (ref 35–70)
LDL Cholesterol: 41
Triglycerides: 54 (ref 40–160)

## 2020-11-01 NOTE — Progress Notes (Signed)
OFFICE VISIT  11/01/2020  CC:  Chief Complaint  Patient presents with  . Follow-up    RCI    HPI:    Patient is a 80 y.o. Caucasian male who presents for 3 month f/u multiple chronic medical problems. A/P as of last visit: ") Rhinorrhea.  No clear sign of infection. Will try ipratropium nasal spray rx today.  2) DM 2, great control on diet alone (a1c 6.1% today). CBC and CMET 07/14/20 all good/stable.  3) Chronic osteomyelitis: ID recommends L BKA as best treatment at this time and sounds like ortho is going to proceed with that plan as long as they get the all-clear after Bob's cath scheduled for later this week.  4) Hx of enterococcus endocarditis: now on amoxil suppression therapy. Certainly getting #3 above done would help this situation out a lot.  5) HTN: stable. Lytes normal and sCr 0.81 about 2 weeks ago.  6) CAD, hx of PCI 2018, with hx of reduced EF: EF recovered to ?>45% (s/p CRT), pt continuing on toprol and entresto, has bumex for prn inc in LE swelling/wt gain but has not required this lately. Recent CP admission 11/26-12/1, 2021. Ruled out. Med mgmt continued. Stress test 06/15/20 showed possible peri-infarct ischemia-->cardiologist to do cath in 2d."  INTERIM HX:  Says he is feeling pretty good/stable, no acute complaints.  Ortho: most recent f/u note from Dr. Sharol Given 10/27/20 reviewed: the superficial ulcer on distal aspect of L foot was debrided and plan was to continue CircAid stockings for his venous and lymphatic swelling and cont Isosorb gel dressing changes.  BKA no longer being discussed as option at this time.  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  CAD/PAF/Hx of TAVR/infective endocarditis of TAVR and RV pacer/ICD lead-->reviewed most recent f/u note from cardiology from 09/20/20, all stable, no new recommendations, plan on f/u 1 yr with Dr.  Radford Pax. Labs from cardiology f/u 08/04/20-->WBC 5.9K, Hb 11.0 with MCV 92, Plt 134k, Na 139, K 4.1, Cl 105, CO2 26, Glu 151, BUN 23, sCr 0.8, Ca++ 9.0.  DM: diet->no meds at this time.  Doing well with low carb diet.  No home gluc monitoring.  HTN:  bp checks at home 110-130.  No diastolics recalled by pt. Has not had to use any bumex lately. Drinks 2-3 quarts of fluids daily, mostly water.  Chronic runny nose improved some with ipratropium nasal spray in combo with flonase and nasalcrom. Says wearing mask outside also helps.   ROS as above, plus--> no fevers, no CP, no SOB, no wheezing, no cough, no dizziness, no HAs, no rashes, no melena/hematochezia.  No polyuria or polydipsia.  No myalgias or arthralgias.  No focal weakness, paresthesias, or tremors.  No acute vision or hearing abnormalities.  No dysuria or unusual/new urinary urgency or frequency.  No recent changes in lower legs. No n/v/d or abd pain.  No palpitations.    Past Medical History:  Diagnosis Date  . AICD (automatic cardioverter/defibrillator) present   . Balanitis    +severe phimosis+ buried penis->circ not possible but dorsal slit done 10/2019  . BPH (benign prostatic hypertrophy)    with urinary retention.  Renal u/s 12/04/19 NORMAL KIDNEYS, NORMAL BLADDER, NO HYDRONEPHROSIS  . CAD (coronary artery disease)    Nonobstructive by 12/2012 cath; then 03/2016 he required BMS to RCA ([facility]).  In-stent restenosis on cath 11/01/16, baloon angioplasty successful.  [name]  CATARACT, HX OF   . Chronic combined systolic and diastolic CHF (congestive heart failure) ([place]) 05/2016   Ischemic CM.  04/2018 EF 40-45%, grd I DD.  [name] Chronic pain syndrome    Lumbar DDD; chronic neuropathic pain (DM); has spinal stimulator and sees pain mgmt MD  . Complicated UTI (urinary tract infection) 09/2019   Phimoses, acute urinary retention, entoerococcus UTI, enterococc bacteremia.  F/u blood clx's neg x 5d.    [name] COPD   . Diabetes mellitus type 2 with  complications (HCC)    WPY0D jumped from 5.7% to 6.1% 03/2015---started metformin at that time.  DM 2 dx by fasting gluc criteria 2018.  Has chronic neuropath pain  . Enterococcal bacteremia 09/2019   Phimoses, acute urinary retention, entoerococcus UTI, enterococc bacteremia.  F/u blood clx's neg x 5d.    . Essential hypertension, benign   . Fatty liver 2007   2007 u/s showed fatty liver with hepatosplenomegaly.  2019 repeat u/s->fatty liver but no cirrhosis or hepatosplenomegaly.  [name] GERD (gastroesophageal reflux disease)    + hx of esoph stenosis, +dilation  . Glaucoma   . GOUT   . HH (hiatus hernia)   . HYPERCHOLESTEROLEMIA-PURE   . Hypogonadism male   . ICD (implantable cardioverter-defibrillator) infection ([place]) 12/22/2019  . 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 Tommy Medal)  . Lumbosacral neuritis   . Lumbosacral spondylosis    Lumbar spinal stenosis with neurogenic claudication--contributes to his chronic pain syndrome  . Normal memory function 08/2014   Neuropsychological testing ([facility]): no cognitive impairment or sign of neurodegenerative disorder.  Likely has adjustment d/o with mixed anxiety/depressed features and may benefit from low dose SNRI.    [name] Normocytic anemia 03/2016   Mild-pt needs ferritin and vit B12 level checked (as of 03/22/16). Hb stable 09/2019  . NSTEMI (non-ST elevated myocardial infarction) ([place]) 03/20/2016   BMS to RCA  . Obesity hypoventilation syndrome ([place])   . Orthostatic hypotension   . OSA on CPAP    8 cm H2O  . OSTEOARTHRITIS   . PAF (paroxysmal atrial fibrillation) ([place])   . Paroxysmal atrial fibrillation ([place]) 2003    (? chronic?) Off anticoag for a while due to falls.  Then apixaban started 12/2014.  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 Ella Bodo): neuropathic pain->diff to treat, failed  nucynta, failed spinal stimul trial, oxycontin hs + tramadol + gabap as of 12/2017 f/u Dr. Letta Pate.  . Personal history of colonic adenoma 10/30/2012   Diminutive adenoma, consider repeat 2019 per GI  . PFO (patent foramen ovale) 09/2019   small, with predominately L to R shunt  . Presence of cardiac defibrillator 11/07/2017  . Primary osteoarthritis of both knees    Bone on bone of medial compartments, + signif patellofemoral arth bilat.--supartz inj series started 09/12/17  . 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 PUD (peptic ulcer disease)   . PULMONARY HYPERTENSION, HX OF   . Rhinitis 07/14/2020  . Secondary male hypogonadism 2017  . Sepsis ([place]) 04/29/2020  . Severe aortic stenosis    TAVR 04/11/16 ([facility])  . Shortness of breath    with exertion: much improved s/p TAVR and treatment for CHF.  . Sick sinus syndrome (HCC)    PPM placed  . Thrombocytopenia ([place]) 2018   HSM on 2007 abd u/s---suspect some mild splenic sequestration chronically.  [name] Unspecified glaucoma(365.9)   .  Unspecified hereditary and idiopathic peripheral neuropathy approx age 46   bilat LE's, ? left arm, too.  Feet became progressively numb + left foot pain intermittently.  Pt may be trying a spinal stimulator (as of 05/2015)  . VENOUS INSUFFICIENCY    Being followed by Dr. Sharol Given as of 10/2016 for two R LL venous stasis ulcers/skin tears.  Healed as of 10/30/16 f/u with Dr. Sharol Given.  . VENTRAL HERNIA   . Wound dehiscence 10/22/2019    Past Surgical History:  Procedure Laterality Date  . AMPUTATION Left 04/11/2013   Procedure: AMPUTATION DIGIT Left 3rd toe;  Surgeon: [name], MD;  Location: [place];  Service: Orthopedics;  Laterality: Left;  Left 3rd toe amputation at [facility]  . AMPUTATION Left 08/29/2019   Procedure: LEFT TRANSMETATARSAL AMPUTATION;  Surgeon: [name], MD;  Location: [place];  Service: Orthopedics;  Laterality: Left;  . BIOPSY  05/04/2020   Procedure: BIOPSY;  Surgeon: Jackquline Denmark,  MD;  Location: [name]COPY;  Service: Endoscopy;;  . [place]; 03/10/16   1997 Non-obstructive disease.  03/2016 BMS to RCA, with 25% pDiag dz, o/w normal cors per cath 03/07/16.  Cath 11/01/16: in stent restenosis, successful baloon angioplasty. 08/2020 branch vessel dz, cont medical therapy rec'd.  [name] CARDIAC CATHETERIZATION  12/24/2012   mild < 20% LCx, prox 30% RCA; LVEF 55-65% , moderate pulmonary HTN, moderate AS  . CARDIAC DEFIBRILLATOR PLACEMENT  11/07/2017   [place] MRI Quad CRT defibrillator  . CARDIOVASCULAR STRESS TEST  05/11/16 ([facility])   2017 Myocardial perfusion imaging:  No ischemia; scar in apex, global hypokinesis, EF 36%.  06/15/20->mod primarily fixed inferolat wall defect mildly worse with stress c/w infarct/scar with mild peri-infarct ischemia, normal EF-->for cath per cards.  . Carotid dopplers  03/09/2016   [facility]: no hemodynamically significant stenosis on either side.  . CHOLECYSTECTOMY    . COLONOSCOPY [name] 10/30/2012   Procedure: COLONOSCOPY;  Surgeon: [name], MD;  Location: [facility] ENDOSCOPY;  Service: Endoscopy;  Laterality: [name];  . CORONARY ANGIOPLASTY WITH STENT PLACEMENT  03/2016; 04/2017   2017-[facility]: BMS to RCA-pt was placed on Brilinta.  04/2017: DES to RCA.  . DORSAL SLIT [name] 10/29/2019   for severe phimosis. Procedure: DORSAL SLIT;  Surgeon: Alexis Frock, MD;  Location: [facility] ORS;  Service: Urology;  Laterality: [name];  37 MINS  . ESOPHAGEAL DILATION  05/04/2020   Procedure: ESOPHAGEAL DILATION;  Surgeon: Jackquline Denmark, MD;  Location: [facility] ENDOSCOPY;  Service: Endoscopy;;  . ESOPHAGOGASTRODUODENOSCOPY (EGD) WITH PROPOFOL [name] 05/04/2020   Procedure: ESOPHAGOGASTRODUODENOSCOPY (EGD) WITH PROPOFOL;  Surgeon: Jackquline Denmark, MD;  Location: [facility] ENDOSCOPY;  Service: Endoscopy;  Laterality: [name];  . EYE SURGERY Bilateral cataract  . HEMORRHOID SURGERY    . INTRAOCULAR LENS INSERTION Bilateral   . KNEE SURGERY Right   . LEFT AND RIGHT HEART CATHETERIZATION  WITH CORONARY ANGIOGRAM [name] 12/24/2012   Procedure: LEFT AND RIGHT HEART CATHETERIZATION WITH CORONARY ANGIOGRAM;  Surgeon: [name], MD;  Location: [facility] CATH LAB;  Service: Cardiovascular;  Laterality: [name];  . LEG SURGERY Bilateral    lenghtening   . PACEMAKER PLACEMENT  04/13/2016   2nd deg HB after TAVR, pt had DC MDT PPM placed.  [name] SHOULDER ARTHROSCOPY  08/30/2011   Procedure: ARTHROSCOPY SHOULDER;  Surgeon: [name], MD;  Location: [place];  Service: Orthopedics;  Laterality: Right;  Right Shoulder Arthroscopy, Debridement, and Decompression  . SPINAL CORD STIMULATOR INSERTION [name] 09/10/2015   Procedure: LUMBAR SPINAL  CORD STIMULATOR INSERTION;  Surgeon: [name], MD;  Location: [place] NEURO ORS;  Service: Neurosurgery;  Laterality: [name];  . TEE WITHOUT CARDIOVERSION [name] 09/16/2019   Procedure: TRANSESOPHAGEAL ECHOCARDIOGRAM (TEE);  Surgeon: Skeet Latch, MD;  Location: [name];  Service: Cardiovascular;  Laterality: [name];  . TEE WITHOUT CARDIOVERSION [name] 12/02/2019   +vegetation on AVR and pacer lead in RV.  EF 55-60%, normal wall motion.  Valves function normal.  Procedure: TRANSESOPHAGEAL ECHOCARDIOGRAM (TEE);  Surgeon: Geralynn Rile, MD;  Location: [place];  Service: Cardiovascular;  Laterality: [name];  . 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  R big toe surg due to osteoarth  . TONSILLECTOMY    . traeculectomy Left    eye  . TRANSCATHETER AORTIC VALVE REPLACEMENT, TRANSFEMORAL  04/11/2016  . TRANSESOPHAGEAL ECHOCARDIOGRAM  03/09/2016; 09/2019   [facility]: EF 55-60%, PFO seen with bi-directional shunting, no thrombus in appendage.  09/2019 ->no valvular vegetations. Small patent foramen ovale with predominantly left to right shunting across the interatrial septum.  . TRANSTHORACIC ECHOCARDIOGRAM  01/2015; 01/2016; 05/18/16; 09/18/16, 05/2017, 08/2017   01/2015 No signif change in aortic stenosis (moderate).  01/2016 Severe LVH w/small LV cavity, EF 60-65%, grade I diast  dysfxn.  05/2016 (s/p TAVR): EF 50-55%, grd I DD, biopros AV good.  08/2016--EF 50-55%, LV septal motion c/w conduction abnl, grd I DD,mild MS,bioprosth aortic valve well seated, w/trace AR. 05/2017 TTE EF 35%. 08/2017-EF 35%, mod diff hypokin LV, grd I DD, biopros AV good.   . TRANSTHORACIC ECHOCARDIOGRAM  04/2018; 09/2019   04/2018: EF 40-45%, mod diffuse LV hypokinesis, grd I DD, bioprosth AV well seated, no AS or AR. 09/2019 EF 60-65%, grd I DD, valves fine, including bioprosth AV. 04/29/20 (tech diff) EF 55-60%, grd I DD, vegetation on MV.  [name] VITRECTOMY      Outpatient Medications Prior to Visit  Medication Sig Dispense Refill  . allopurinol (ZYLOPRIM) 300 MG tablet TAKE 1 TABLET BY MOUTH ONCE DAILY WITH FOOD 30 tablet 0  . amoxicillin (AMOXIL) 500 MG tablet Take 2 tablets (1,000 mg total) by mouth 2 (two) times daily. 120 tablet 11  . apixaban (ELIQUIS) 5 MG TABS tablet Take 5 mg by mouth 2 (two) times daily.    . ASPIRIN LOW DOSE 81 MG EC tablet Take 81 mg by mouth daily.    . B Complex-C (B-COMPLEX WITH VITAMIN C) tablet Take 1 tablet by mouth daily. Take 1 tablet daily, 219m    . cadexomer iodine (IODOSORB) 0.9 % gel Apply 1 application topically daily as needed for wound care. Apply to the affected area daily plus dry dressing 40 g 3  . ezetimibe (ZETIA) 10 MG tablet Take 1 tablet (10 mg total) by mouth daily. 30 tablet 1  . finasteride (PROSCAR) 5 MG tablet TAKE 1 TABLET BY MOUTH EVERY DAY 90 tablet 0  . fluticasone (FLONASE) 50 MCG/ACT nasal spray Place 2 sprays into both nostrils as needed for allergies or rhinitis.    .Marland Kitchengabapentin (NEURONTIN) 800 MG tablet TAKE 1 TABLET (800 MG TOTAL) BY MOUTH 4 (FOUR) TIMES DAILY. 360 tablet 2  . ipratropium (ATROVENT) 0.03 % nasal spray 2 SPRAY EACH NOSTRIL EVERY 12 HOURS FOR RUNNY NOSE 30 mL 2  . KLOR-CON M20 20 MEQ tablet TAKE 1 TABLET BY MOUTH EVERY DAY 30 tablet 1  . metoprolol succinate (TOPROL-XL) 25 MG 24 hr tablet Take 25 mg by mouth daily.     . Multiple Vitamin (MULTIVITAMIN ADULT PO) Take  by mouth daily.    . niacin (NIASPAN) 1000 MG CR tablet TAKE 1 TABLET BY MOUTH TWICE A DAY 180 tablet 0  . nitroGLYCERIN (NITROSTAT) 0.4 MG SL tablet Place 0.4 mg under the tongue every 5 (five) minutes as needed for chest pain.     . rosuvastatin (CRESTOR) 40 MG tablet take 1 tablet by mouth once daily (Patient taking differently: Take 40 mg by mouth daily.) 15 tablet 0  . sacubitril-valsartan (ENTRESTO) 24-26 MG Take 1 tablet by mouth 2 (two) times daily.     . vitamin C (ASCORBIC ACID) 250 MG tablet Take 250 mg by mouth daily.    [name] VITAMIN D, CHOLECALCIFEROL, PO Take 1 tablet by mouth daily.     Grant Ruts INHUB 250-50 MCG/DOSE AEPB TAKE 1 PUFF BY MOUTH TWICE A DAY 60 each 11  . albuterol (PROVENTIL HFA;VENTOLIN HFA) 108 (90 BASE) MCG/ACT inhaler Inhale 2 puffs into the lungs 4 (four) times daily as needed for wheezing or shortness of breath. (Patient not taking: No sig reported)    . pantoprazole (PROTONIX) 40 MG tablet TAKE 1 TABLET BY MOUTH EVERY DAY (Patient not taking: Reported on 11/01/2020) 90 tablet 3  . torsemide (DEMADEX) 20 MG tablet Take 1 tablet (20 mg total) by mouth in the morning and at bedtime. (Patient not taking: No sig reported) 60 tablet 0  . Respiratory Therapy Supplies MISC Inhale into the lungs. (Patient not taking: Reported on 11/01/2020)     No facility-administered medications prior to visit.    Allergies  Allergen Reactions  . Brimonidine Tartrate Shortness Of Breath    Alphagan-Shortness of breath  . Brinzolamide Shortness Of Breath    AZOPT- Shortness of breath  . Latanoprost Shortness Of Breath    XALATAN- Shortness of breath  . Nucynta [Tapentadol] Shortness Of Breath  . Sulfa Antibiotics Palpitations  . Timolol Maleate Shortness Of Breath and Other (See Comments)    TIMOPTIC- Aggravated asthma  . Diltiazem Swelling     leg swelling  . Rofecoxib Swelling     VIOXX- leg swelling  . Vancomycin Hives and Other  (See Comments)    Possible "Red Man Syndrome"? > hives/blisters  . Codeine Other (See Comments)    Childhood reaction  . Tamsulosin Other (See Comments)    Dizziness   . Celecoxib Other (See Comments)    CELLBREX-confusion  . Colchicine Diarrhea    diarrhea  . Tape Rash    ROS As per HPI  PE: Vitals with BMI 11/01/2020 08/02/2020 07/14/2020  Height 6' 0"  6' 0"  -  Weight (No Data) (No Data) -  BMI - - -  Systolic 87 921 194  Diastolic 50 77 71  Pulse 66 77 84  BP check today was with automated cuff, large adult->on his L forearm (we do not have a cuff large enough to check bp on his upper arm).   Gen: Alert, well appearing.  Patient is oriented to person, place, time, and situation. AFFECT: pleasant, lucid thought and speech. CV: RRR, no m/r/g.   LUNGS: CTA bilat, nonlabored resps, good aeration in all lung fields. EXT: no clubbing or cyanosis.  No pitting edema.   Generalized LL lymphedema noted.  No skin breakdown. I did not examine L foot b/c it has dressing on it and is followed by ortho.   LABS:  Lab Results  Component Value Date   TSH 0.905 04/29/2020   Lab Results  Component Value Date   WBC 7.2 07/14/2020  HGB 12.2 (L) 07/14/2020   HCT 36.7 (L) 07/14/2020   MCV 89.3 07/14/2020   PLT 121 (L) 07/14/2020   Lab Results  Component Value Date   IRON 59 05/24/2016   IRON 54 05/24/2016   TIBC 334 05/24/2016   FERRITIN 44.8 05/24/2016   Lab Results  Component Value Date   VITAMINB12 429 05/24/2016    Lab Results  Component Value Date   CREATININE 0.81 07/14/2020   BUN 25 07/14/2020   NA 141 07/14/2020   K 4.5 07/14/2020   CL 106 07/14/2020   CO2 28 07/14/2020   Lab Results  Component Value Date   ALT 19 05/07/2020   AST 21 05/07/2020   ALKPHOS 47 05/07/2020   BILITOT 0.4 05/07/2020   Lab Results  Component Value Date   CHOL 107 12/25/2018   Lab Results  Component Value Date   HDL 62.70 12/25/2018   Lab Results  Component Value Date    LDLCALC 29 12/25/2018   Lab Results  Component Value Date   TRIG 72.0 12/25/2018   Lab Results  Component Value Date   CHOLHDL 2 12/25/2018   Lab Results  Component Value Date   PSA 0.23 11/23/2015   PSA 0.33 10/07/2014   PSA 0.40 09/25/2013   Lab Results  Component Value Date   HGBA1C 5.5 11/01/2020   HGBA1C 5.5 11/01/2020   HGBA1C 5.5 (A) 11/01/2020   HGBA1C 5.5 11/01/2020   Hba1c 5.5% today  IMPRESSION AND PLAN:  1) DM 2, diet controlled. A1c 5.5% today.  2) HTN: normal to low-normal bp at home, low here today but suspect some increased margin of error with the technique we have to use to measure his bp in office. No symptoms of low bp. Cont toprol xl 76m qd and entresto 24-26, 1 bid. Lytes/cr stable/normal 3 mo ago at cardiologist's office--reviewed today. He has orders from his cardiologist to get rpt routine labs at [facility] at his earliest convenience.  3) CAD: asymptomatic---he has hx of atypical CP suspected to be GI etiology. Most recent Cath 08/2020 showed branch vessel dz, no intervention recommended-->cont medical therapy with ASA 84mqd, BB, statin, zetia.  4) Hx of CHF: appears euvolemic. Followed by cardiology, has bumex to use prn but does not typically need this medication.  5) PAF: chronic anticoag with eliquis [id number].  Asymptomatic. Hb normal 08/2020 with cardiology.  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 Cont amoxil 1g bid suppressive therapy. Since his foot is stable they have stopped talking about possible L BKA.  7) Debilitated pt: his multiple cardiopulmonary comorbidities + neuropathy and orthopedic complications+ morbid obesity have limited his mobility to using 2 canes to ambulate.  Pt to have labs done via cardiologist's orders ([facility]) soon. No blood draw here today.  An After Visit Summary was printed and given to the patient.  FOLLOW UP: Return in about 3 months (around 02/01/2021) for  routine chronic illness f/u.  Signed:  PhiCrissie SicklesD           11/01/2020

## 2020-11-06 ENCOUNTER — Other Ambulatory Visit: Payer: Self-pay | Admitting: Family Medicine

## 2020-11-17 ENCOUNTER — Encounter: Payer: Self-pay | Admitting: Physician Assistant

## 2020-11-17 ENCOUNTER — Ambulatory Visit (INDEPENDENT_AMBULATORY_CARE_PROVIDER_SITE_OTHER): Payer: Medicare Other | Admitting: Physician Assistant

## 2020-11-17 DIAGNOSIS — L97521 Non-pressure chronic ulcer of other part of left foot limited to breakdown of skin: Secondary | ICD-10-CM | POA: Diagnosis not present

## 2020-11-17 NOTE — Progress Notes (Signed)
Office Visit Note   Patient: Tim [name]           Date of Birth: 1941/06/11           MRN: [id number] Visit Date: 11/17/2020              Requested by: Tammi Sou, MD [address] [place],  [place] [address] PCP: Tammi Sou, MD  No chief complaint on file.     HPI: Patient is an 80 year old gentleman who is seen in follow-up for Wagner grade 1 ulcer over the end of his transmetatarsal amputation patient is currently wearing CircAid stockings for his venous and lymphatic swelling. Has been doing iodosorb dressings as discussed with dr duda.   Assessment & Plan: Visit Diagnoses:  No diagnosis found.  Plan: Continue with his current compression continue with Iodosorb dressing changes.  Follow-Up Instructions: No follow-ups on file.   Ortho Exam  [name], oriented, no adenopathy, well-dressed, normal affect, normal respiratory effort. Examination patient has a hypertrophic ulcer over the end of the residual limb.  This is 15 mm in diameter.  After informed consent a 10 blade knife was used to debride skin and soft tissue back to healthy viable tissue.  This was stable at 3 cm in diameter after debridement.  Silver nitrate was used hemostasis, mupirocin dressing was applied.  There is no redness no cellulitis no signs of infection.  The ulcer does not probe to bone or tendon there is no tunneling.  Imaging: No results found. No images are attached to the encounter.  Labs: Lab Results  Component Value Date   HGBA1C 5.5 11/01/2020   HGBA1C 5.5 11/01/2020   HGBA1C 5.5 (A) 11/01/2020   HGBA1C 5.5 11/01/2020   ESRSEDRATE 38 (H) 07/14/2020   ESRSEDRATE 20 02/09/2016   ESRSEDRATE 36 (H) 01/28/2014   CRP 0.9 07/14/2020   CRP 0.7 05/03/2020   CRP 1.0 (H) 05/02/2020   LABURIC 3.5 (L) 09/10/2013   REPTSTATUS 05/04/2020 FINAL 04/28/2020   CULT  04/28/2020    NO GROWTH 5 DAYS Performed at [facility], [place] [address] [place], [place]  [address]    LABORGA ENTEROCOCCUS FAECALIS 09/11/2019     Lab Results  Component Value Date   ALBUMIN 3.0 (L) 05/07/2020   ALBUMIN 2.9 (L) 05/06/2020   ALBUMIN 3.0 (L) 05/05/2020    Lab Results  Component Value Date   MG 1.7 05/07/2020   MG 1.6 (L) 05/06/2020   MG 1.8 05/05/2020   No results found for: VD25OH  No results found for: PREALBUMIN CBC EXTENDED Latest Ref Rng & Units 07/14/2020 05/07/2020 05/06/2020  WBC 3.8 - 10.8 Thousand/uL 7.2 5.5 5.6  RBC 4.20 - 5.80 Million/uL 4.11(L) 3.54(L) 3.41(L)  HGB 13.2 - 17.1 g/dL 12.2(L) 10.2(L) 10.1(L)  HCT 38.5 - 50.0 % 36.7(L) 32.1(L) 31.0(L)  PLT 140 - 400 Thousand/uL 121(L) 133(L) 129(L)  NEUTROABS 1,500 - 7,800 cells/uL 4,694 3.0 3.2  LYMPHSABS 850 - 3,900 cells/uL 1,390 1.6 1.6     There is no height or weight on file to calculate BMI.  Orders:  No orders of the defined types were placed in this encounter.  No orders of the defined types were placed in this encounter.    Procedures: No procedures performed  Clinical Data: No additional findings.  ROS:  All other systems negative, except as noted in the HPI. Review of Systems  Constitutional: Negative for chills and fever.  Cardiovascular: Positive  for leg swelling.  Skin: Positive for wound. Negative for color change.    Objective: Vital Signs: There were no vitals taken for this visit.  Specialty Comments:  No specialty comments available.  PMFS History: Patient Active Problem List   Diagnosis Date Noted  . Rhinitis 07/14/2020  . Mitral valve vegetation 05/28/2020  . Non-pressure chronic ulcer of other part of left foot limited to breakdown of skin ([place])   . Esophageal dysphagia   . Bacteremia   . Endocarditis of mitral valve   . Fever 04/28/2020  . Cervical lymphadenopathy 03/15/2020  . 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 ICD (implantable cardioverter-defibrillator) infection ([place]) 12/22/2019  . Gram-positive bacteremia 11/27/2019  .  FUO (fever of unknown origin) 11/26/2019  . Low blood pressure reading 11/05/2019  . Wound dehiscence 10/22/2019  . PFO (patent foramen ovale) 10/06/2019  . ICD (implantable cardioverter-defibrillator) battery depletion   . Pressure injury of skin 09/13/2019  . Enterococcal bacteremia history  09/13/2019  . 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  . 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 Urinary retention 09/13/2019  . Phimosis 09/13/2019  . SIRS (systemic inflammatory response syndrome) ([place]) 09/12/2019  . Sepsis ([place]) 09/12/2019  . Enterococcus UTI 09/12/2019  . Cellulitis of foot   . History of transmetatarsal amputation of left foot ([place])   . Subacute osteomyelitis, left ankle and foot ([place])   . 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 ([place]) 04/17/2018  . Diabetic neuropathy, painful ([place]) 12/26/2016  . Neuropathic pain of foot, left 06/20/2016  . 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 Status post amputation of left great toe ([place]) 06/08/2015  . Spinal stenosis of lumbar region 02/16/2015  . Hereditary and idiopathic peripheral neuropathy 01/12/2015  . Oral thrush 12/28/2014  . Orthostatic dizziness 05/24/2014  . Paresthesias with subjective weakness 01/30/2014  . Bilateral thigh pain 01/30/2014  . Abdominal wall pain in right upper quadrant 01/22/2014  . Gross hematuria 09/25/2013  . Intertrigo 09/25/2013  . IBS (irritable bowel syndrome) 09/11/2013  . Chronic pain syndrome 08/06/2013  . CAD (coronary artery disease), native coronary artery 12/25/2012  . Paroxysmal atrial fibrillation ([place]) 12/25/2012  . Moderate aortic stenosis 12/25/2012  . Aortic stenosis 12/24/2012  . Personal history of colonic adenoma 10/30/2012  . Diverticulosis of colon (without mention of hemorrhage) 10/30/2012  . Internal hemorrhoids 10/30/2012  . Change in bowel habits intermittent loose stools  10/30/2012  . Routine health maintenance 06/09/2012  . Hereditary peripheral neuropathy 10/10/2011  . Long term current use of anticoagulant - apixiban 08/16/2010  . Dyspnea on exertion 09/08/2009  . HYPERCHOLESTEROLEMIA-PURE 01/13/2009  . Essential hypertension, benign 01/13/2009  . EDEMA 01/13/2009  . GOUT 01/12/2009  . GLAUCOMA 01/12/2009  . Venous insufficiency (chronic) (peripheral) 01/12/2009  . COPD (chronic obstructive pulmonary disease) ([place]) 01/12/2009  . VENTRAL HERNIA 01/12/2009  . OSTEOARTHRITIS 01/12/2009  . ARTHRITIS 01/12/2009  . DEGENERATIVE DISC DISEASE 01/12/2009  . CATARACT, HX OF 01/12/2009  . HEART MURMUR, HX OF 01/12/2009  . BENIGN PROSTATIC HYPERTROPHY, HX OF 01/12/2009  . Obesity 01/17/2007  . OBSTRUCTIVE SLEEP APNEA 01/17/2007  . ASTHMA 01/17/2007  . GERD 01/17/2007  . HALLUX VALGUS, ACQUIRED 01/17/2007  . Pulmonary hypertension ([place]) 01/17/2007   Past Medical History:  Diagnosis Date  . AICD (automatic cardioverter/defibrillator) present   . Balanitis    +severe phimosis+ buried penis->circ not possible but dorsal slit done 10/2019  . BPH (benign prostatic hypertrophy)    with  urinary retention.  Renal u/s 12/04/19 NORMAL KIDNEYS, NORMAL BLADDER, NO HYDRONEPHROSIS  . CAD (coronary artery disease)    Nonobstructive by 12/2012 cath; then 03/2016 he required BMS to RCA ([facility]).  In-stent restenosis on cath 11/01/16, baloon angioplasty successful.  [name] CATARACT, HX OF   . Chronic combined systolic and diastolic CHF (congestive heart failure) ([place]) 05/2016   Ischemic CM.  04/2018 EF 40-45%, grd I DD.  [name] Chronic pain syndrome    Lumbar DDD; chronic neuropathic pain (DM); has spinal stimulator and sees pain mgmt MD  . Complicated UTI (urinary tract infection) 09/2019   Phimoses, acute urinary retention, entoerococcus UTI, enterococc bacteremia.  F/u blood clx's neg x 5d.    [name] COPD   . Diabetes mellitus type 2 with complications (HCC)    RKY7C jumped from 5.7%  to 6.1% 03/2015---started metformin at that time.  DM 2 dx by fasting gluc criteria 2018.  Has chronic neuropath pain  . Enterococcal bacteremia 09/2019   Phimoses, acute urinary retention, entoerococcus UTI, enterococc bacteremia.  F/u blood clx's neg x 5d.    . Essential hypertension, benign   . Fatty liver 2007   2007 u/s showed fatty liver with hepatosplenomegaly.  2019 repeat u/s->fatty liver but no cirrhosis or hepatosplenomegaly.  [name] GERD (gastroesophageal reflux disease)    + hx of esoph stenosis, +dilation  . Glaucoma   . GOUT   . HH (hiatus hernia)   . HYPERCHOLESTEROLEMIA-PURE   . Hypogonadism male   . ICD (implantable cardioverter-defibrillator) infection ([place]) 12/22/2019  . 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 Tommy Medal)  . Lumbosacral neuritis   . Lumbosacral spondylosis    Lumbar spinal stenosis with neurogenic claudication--contributes to his chronic pain syndrome  . Normal memory function 08/2014   Neuropsychological testing ([facility]): no cognitive impairment or sign of neurodegenerative disorder.  Likely has adjustment d/o with mixed anxiety/depressed features and may benefit from low dose SNRI.    [name] Normocytic anemia 03/2016   Mild-pt needs ferritin and vit B12 level checked (as of 03/22/16). Hb stable 09/2019  . NSTEMI (non-ST elevated myocardial infarction) ([place]) 03/20/2016   BMS to RCA  . Obesity hypoventilation syndrome ([place])   . Orthostatic hypotension   . OSA on CPAP    8 cm H2O  . OSTEOARTHRITIS   . PAF (paroxysmal atrial fibrillation) ([place])   . Paroxysmal atrial fibrillation ([place]) 2003    (? chronic?) Off anticoag for a while due to falls.  Then apixaban started 12/2014.  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 Ella Bodo): neuropathic pain->diff to treat, failed nucynta, failed spinal stimul trial, oxycontin hs +  tramadol + gabap as of 12/2017 f/u Dr. Letta Pate.  . Personal history of colonic adenoma 10/30/2012   Diminutive adenoma, consider repeat 2019 per GI  . PFO (patent foramen ovale) 09/2019   small, with predominately L to R shunt  . Presence of cardiac defibrillator 11/07/2017  . Primary osteoarthritis of both knees    Bone on bone of medial compartments, + signif patellofemoral arth bilat.--supartz inj series started 09/12/17  . 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 PUD (peptic ulcer disease)   . PULMONARY HYPERTENSION, HX OF   . Rhinitis 07/14/2020  . Secondary male hypogonadism 2017  . Sepsis ([place]) 04/29/2020  . Severe aortic stenosis    TAVR 04/11/16 ([facility])  . Shortness of breath  with exertion: much improved s/p TAVR and treatment for CHF.  . Sick sinus syndrome (HCC)    PPM placed  . Thrombocytopenia ([place]) 2018   HSM on 2007 abd u/s---suspect some mild splenic sequestration chronically.  [name] Unspecified glaucoma(365.9)   . Unspecified hereditary and idiopathic peripheral neuropathy approx age 24   bilat LE's, ? left arm, too.  Feet became progressively numb + left foot pain intermittently.  Pt may be trying a spinal stimulator (as of 05/2015)  . VENOUS INSUFFICIENCY    Being followed by Dr. Sharol Given as of 10/2016 for two R LL venous stasis ulcers/skin tears.  Healed as of 10/30/16 f/u with Dr. Sharol Given.  . VENTRAL HERNIA   . Wound dehiscence 10/22/2019    Family History  Problem Relation Age of Onset  . Hypertension Mother   . Coronary artery disease Mother   . Heart attack Mother   . Neuropathy Mother   . Pulmonary fibrosis Father        asbestosis    Past Surgical History:  Procedure Laterality Date  . AMPUTATION Left 04/11/2013   Procedure: AMPUTATION DIGIT Left 3rd toe;  Surgeon: [name], MD;  Location: [facility];  Service: Orthopedics;  Laterality: Left;  Left 3rd toe amputation at [facility]  . AMPUTATION Left 08/29/2019   Procedure: LEFT TRANSMETATARSAL AMPUTATION;   Surgeon: [name], MD;  Location: [facility];  Service: Orthopedics;  Laterality: Left;  . BIOPSY  05/04/2020   Procedure: BIOPSY;  Surgeon: Jackquline Denmark, MD;  Location: [facility] ENDOSCOPY;  Service: Endoscopy;;  . [place]; 03/10/16   1997 Non-obstructive disease.  03/2016 BMS to RCA, with 25% pDiag dz, o/w normal cors per cath 03/07/16.  Cath 11/01/16: in stent restenosis, successful baloon angioplasty. 08/2020 branch vessel dz, cont medical therapy rec'd.  [name] CARDIAC CATHETERIZATION  12/24/2012   mild < 20% LCx, prox 30% RCA; LVEF 55-65% , moderate pulmonary HTN, moderate AS  . CARDIAC DEFIBRILLATOR PLACEMENT  11/07/2017   [facility] MRI Quad CRT defibrillator  . CARDIOVASCULAR STRESS TEST  05/11/16 ([facility])   2017 Myocardial perfusion imaging:  No ischemia; scar in apex, global hypokinesis, EF 36%.  06/15/20->mod primarily fixed inferolat wall defect mildly worse with stress c/w infarct/scar with mild peri-infarct ischemia, normal EF-->for cath per cards.  . Carotid dopplers  03/09/2016   [facility]: no hemodynamically significant stenosis on either side.  . CHOLECYSTECTOMY    . COLONOSCOPY [name] 10/30/2012   Procedure: COLONOSCOPY;  Surgeon: [name], MD;  Location: [facility] ENDOSCOPY;  Service: Endoscopy;  Laterality: [name];  . CORONARY ANGIOPLASTY WITH STENT PLACEMENT  03/2016; 04/2017   2017-[facility]: BMS to RCA-pt was placed on Brilinta.  04/2017: DES to RCA.  . DORSAL SLIT [name] 10/29/2019   for severe phimosis. Procedure: DORSAL SLIT;  Surgeon: Alexis Frock, MD;  Location: [facility] ORS;  Service: Urology;  Laterality: [name];  30 MINS  . ESOPHAGEAL DILATION  05/04/2020   Procedure: ESOPHAGEAL DILATION;  Surgeon: Jackquline Denmark, MD;  Location: [facility] ENDOSCOPY;  Service: Endoscopy;;  . ESOPHAGOGASTRODUODENOSCOPY (EGD) WITH PROPOFOL [name] 05/04/2020   Procedure: ESOPHAGOGASTRODUODENOSCOPY (EGD) WITH PROPOFOL;  Surgeon: Jackquline Denmark, MD;  Location: [facility] ENDOSCOPY;  Service: Endoscopy;  Laterality: [name];  . EYE  SURGERY Bilateral cataract  . HEMORRHOID SURGERY    . INTRAOCULAR LENS INSERTION Bilateral   . KNEE SURGERY Right   . LEFT AND RIGHT HEART CATHETERIZATION WITH CORONARY ANGIOGRAM [name] 12/24/2012   Procedure: LEFT AND RIGHT HEART CATHETERIZATION WITH CORONARY ANGIOGRAM;  Surgeon: [name], MD;  Location: [facility] CATH LAB;  Service: Cardiovascular;  Laterality: [name];  . LEG SURGERY Bilateral    lenghtening   . PACEMAKER PLACEMENT  04/13/2016   2nd deg HB after TAVR, pt had DC MDT PPM placed.  [name] SHOULDER ARTHROSCOPY  08/30/2011   Procedure: ARTHROSCOPY SHOULDER;  Surgeon: [name], MD;  Location: [facility];  Service: Orthopedics;  Laterality: Right;  Right Shoulder Arthroscopy, Debridement, and Decompression  . SPINAL CORD STIMULATOR INSERTION [name] 09/10/2015   Procedure: LUMBAR SPINAL CORD STIMULATOR INSERTION;  Surgeon: [name], MD;  Location: [facility]URO ORS;  Service: Neurosurgery;  Laterality: [name];  . TEE WITHOUT CARDIOVERSION [name] 09/16/2019   Procedure: TRANSESOPHAGEAL ECHOCARDIOGRAM (TEE);  Surgeon: Skeet Latch, MD;  Location: [facility];  Service: Cardiovascular;  Laterality: [name];  . TEE WITHOUT CARDIOVERSION [name] 12/02/2019   +vegetation on AVR and pacer lead in RV.  EF 55-60%, normal wall motion.  Valves function normal.  Procedure: TRANSESOPHAGEAL ECHOCARDIOGRAM (TEE);  Surgeon: Geralynn Rile, MD;  Location: [facility];  Service: Cardiovascular;  Laterality: [name];  . 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  R big toe surg due to osteoarth  . TONSILLECTOMY    . traeculectomy Left    eye  . TRANSCATHETER AORTIC VALVE REPLACEMENT, TRANSFEMORAL  04/11/2016  . TRANSESOPHAGEAL ECHOCARDIOGRAM  03/09/2016; 09/2019   [facility]: EF 55-60%, PFO seen with bi-directional shunting, no thrombus in appendage.  09/2019 ->no valvular vegetations. Small patent foramen ovale with predominantly left to right shunting across the interatrial septum.  . TRANSTHORACIC ECHOCARDIOGRAM  01/2015;  01/2016; 05/18/16; 09/18/16, 05/2017, 08/2017   01/2015 No signif change in aortic stenosis (moderate).  01/2016 Severe LVH w/small LV cavity, EF 60-65%, grade I diast dysfxn.  05/2016 (s/p TAVR): EF 50-55%, grd I DD, biopros AV good.  08/2016--EF 50-55%, LV septal motion c/w conduction abnl, grd I DD,mild MS,bioprosth aortic valve well seated, w/trace AR. 05/2017 TTE EF 35%. 08/2017-EF 35%, mod diff hypokin LV, grd I DD, biopros AV good.   . TRANSTHORACIC ECHOCARDIOGRAM  04/2018; 09/2019   04/2018: EF 40-45%, mod diffuse LV hypokinesis, grd I DD, bioprosth AV well seated, no AS or AR. 09/2019 EF 60-65%, grd I DD, valves fine, including bioprosth AV. 04/29/20 (tech diff) EF 55-60%, grd I DD, vegetation on MV.  [name] VITRECTOMY     Social History   Occupational History  . Occupation: Engineer, production    Comment: retired  Tobacco Use  . Smoking status: Never Smoker  . Smokeless tobacco: Never Used  Vaping Use  . Vaping Use: Never used  Substance and Sexual Activity  . Alcohol use: No    Alcohol/week: 0.0 standard drinks  . Drug use: No    Types: Oxycodone  . Sexual activity: Not Currently

## 2020-11-18 ENCOUNTER — Other Ambulatory Visit: Payer: Self-pay | Admitting: Family Medicine

## 2020-11-24 ENCOUNTER — Other Ambulatory Visit: Payer: Self-pay

## 2020-11-24 ENCOUNTER — Ambulatory Visit (INDEPENDENT_AMBULATORY_CARE_PROVIDER_SITE_OTHER): Payer: Medicare Other | Admitting: Infectious Disease

## 2020-11-24 VITALS — BP 126/74 | HR 74 | Resp 16 | Ht 72.0 in | Wt 318.0 lb

## 2020-11-24 DIAGNOSIS — T826XXD Infection and inflammatory reaction due to cardiac valve prosthesis, subsequent encounter: Secondary | ICD-10-CM | POA: Diagnosis not present

## 2020-11-24 DIAGNOSIS — T827XXD Infection and inflammatory reaction due to other cardiac and vascular devices, implants and grafts, subsequent encounter: Secondary | ICD-10-CM

## 2020-11-24 DIAGNOSIS — I350 Nonrheumatic aortic (valve) stenosis: Secondary | ICD-10-CM | POA: Diagnosis not present

## 2020-11-24 DIAGNOSIS — M86272 Subacute osteomyelitis, left ankle and foot: Secondary | ICD-10-CM

## 2020-11-24 DIAGNOSIS — I38 Endocarditis, valve unspecified: Secondary | ICD-10-CM

## 2020-11-24 NOTE — Progress Notes (Signed)
Subjective:    Patient ID: Tim [name], male    DOB: [birth year], 80 y.o.   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 male who  initially presented with AMP sensitive enterococcal UTI complicated by enterococcal bacteremia in March 2021 in the setting of AICD and TAVR. TTE and TEE at the time were without evidence of vegetation and was prescribed 2 weeks of ampicillin. On follow up on 4/21 there was worsening of his amputation site with erythema and drainage. He was placed on doxycycline by Dr. Sharol Given which was then changed to Zyvox by Dr. Tommy Medal. This resolved his symptoms. On follow up with Dr. Tommy Medal on 5/26 he was experiencing fevers and dizziness with concern for systemic infection. Blood cultures in the ID clinic were positive for Enterococcus faecalis bacteremia. Surgical site was well healed at that time. 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 Recommended treatment was 6 weeks of ampicillin and ceftriaxone through 6/21. Feeling better on 6/21 and transitioned to life-long suppression with amoxicillin three times daily.   Mr. Bonenberger was then seen on 7/15 seen by Dr. Johnnye Sima with concern for cellulitis and was continued on his Amoxicillin. He followed up with Dr. Johnnye Sima on 8/6 and continued to do well with Amoxicillin. Last seen by Dr. Tommy Medal on 9/13 and was continued on Amoxicillin and amputation site was followed by Dr. Sharol Given.   Blood cultures no growth but MV had vegetation. TEE was not successful.  He was retreated with ampicillin high-dose and ceftriaxone twice daily.  He has completed 6 weeks of therapy and now is on high-dose amoxicillin to 1000 g twice daily.  He has now had react catheterization has been cleaving operative clearance for below the knee amputation.  However Dr. Sharol Given apparently is felt better about how his amputation site is been doing.  He has been followed quite  regularly there and recently had debridement of some tissue.  Wound notably does not probe to bone.     Past Medical History:  Diagnosis Date  . AICD (automatic cardioverter/defibrillator) present   . Balanitis    +severe phimosis+ buried penis->circ not possible but dorsal slit done 10/2019  . BPH (benign prostatic hypertrophy)    with urinary retention.  Renal u/s 12/04/19 NORMAL KIDNEYS, NORMAL BLADDER, NO HYDRONEPHROSIS  . CAD (coronary artery disease)    Nonobstructive by 12/2012 cath; then 03/2016 he required BMS to RCA ([facility]).  In-stent restenosis on cath 11/01/16, baloon angioplasty successful.  [name] CATARACT, HX OF   . Chronic combined systolic and diastolic CHF (congestive heart failure) ([place]) 05/2016   Ischemic CM.  04/2018 EF 40-45%, grd I DD.  [name] Chronic pain syndrome    Lumbar DDD; chronic neuropathic pain (DM); has spinal stimulator and sees pain mgmt MD  . Complicated UTI (urinary tract infection) 09/2019   Phimoses, acute urinary retention, entoerococcus UTI, enterococc bacteremia.  F/u blood clx's neg x 5d.    [name] COPD   . Diabetes mellitus type 2 with complications (HCC)    [id number] jumped from 5.7% to 6.1% 03/2015---started metformin at that time.  DM 2 dx by fasting gluc criteria 2018.  Has chronic neuropath pain  . Enterococcal bacteremia 09/2019   Phimoses, acute urinary retention, entoerococcus UTI, enterococc bacteremia.  F/u blood clx's neg x 5d.    . Essential hypertension, benign   . Fatty liver 2007   2007 u/s showed fatty liver with hepatosplenomegaly.  2019 repeat u/s->fatty liver but no cirrhosis or hepatosplenomegaly.  [name] GERD (gastroesophageal reflux disease)    + hx of esoph stenosis, +dilation  . Glaucoma   . GOUT   . HH (hiatus hernia)   . HYPERCHOLESTEROLEMIA-PURE   . Hypogonadism male   . ICD (implantable cardioverter-defibrillator) infection ([place]) 12/22/2019  . 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 Tommy Medal)  . Lumbosacral neuritis   . Lumbosacral spondylosis    Lumbar spinal stenosis with neurogenic claudication--contributes to his chronic pain syndrome  . Normal memory function 08/2014   Neuropsychological testing ([facility]): no cognitive impairment or sign of neurodegenerative disorder.  Likely has adjustment d/o with mixed anxiety/depressed features and may benefit from low dose SNRI.    [name] Normocytic anemia 03/2016   Mild-pt needs ferritin and vit B12 level checked (as of 03/22/16). Hb stable 09/2019  . NSTEMI (non-ST elevated myocardial infarction) ([place]) 03/20/2016   BMS to RCA  . Obesity hypoventilation syndrome ([place])   . Orthostatic hypotension   . OSA on CPAP    8 cm H2O  . OSTEOARTHRITIS   . PAF (paroxysmal atrial fibrillation) ([place])   . Paroxysmal atrial fibrillation ([place]) 2003    (? chronic?) Off anticoag for a while due to falls.  Then apixaban started 12/2014.  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 Ella Bodo): neuropathic pain->diff to treat, failed nucynta, failed spinal stimul trial, oxycontin hs + tramadol + gabap as of 12/2017 f/u Dr. Letta Pate.  . Personal history of colonic adenoma 10/30/2012   Diminutive adenoma, consider repeat 2019 per GI  . PFO (patent foramen ovale) 09/2019   small, with predominately L to R shunt  . Presence of cardiac defibrillator 11/07/2017  . Primary osteoarthritis of both knees    Bone on bone of medial compartments, + signif patellofemoral arth bilat.--supartz inj series started 09/12/17  . 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 PUD (peptic ulcer disease)   . PULMONARY HYPERTENSION, HX OF   . Rhinitis 07/14/2020  . Secondary male hypogonadism 2017  . Sepsis ([place]) 04/29/2020  . Severe aortic stenosis    TAVR 04/11/16 ([facility])  . Shortness of breath    with exertion: much improved s/p TAVR and treatment for CHF.  . Sick sinus  syndrome (HCC)    PPM placed  . Thrombocytopenia ([place]) 2018   HSM on 2007 abd u/s---suspect some mild splenic sequestration chronically.  [name] Unspecified glaucoma(365.9)   . Unspecified hereditary and idiopathic peripheral neuropathy approx age 74   bilat LE's, ? left arm, too.  Feet became progressively numb + left foot pain intermittently.  Pt may be trying a spinal stimulator (as of 05/2015)  . VENOUS INSUFFICIENCY    Being followed by Dr. Sharol Given as of 10/2016 for two R LL venous stasis ulcers/skin tears.  Healed as of 10/30/16 f/u with Dr. Sharol Given.  . VENTRAL HERNIA   . Wound dehiscence 10/22/2019    Past Surgical History:  Procedure Laterality Date  . AMPUTATION Left 04/11/2013   Procedure: AMPUTATION DIGIT Left 3rd toe;  Surgeon: [name], MD;  Location: [place];  Service: Orthopedics;  Laterality: Left;  Left 3rd toe amputation at [facility]  . AMPUTATION Left 08/29/2019   Procedure: LEFT TRANSMETATARSAL AMPUTATION;  Surgeon: [name], MD;  Location: [place];  Service: Orthopedics;  Laterality: Left;  . BIOPSY  05/04/2020   Procedure:  BIOPSY;  Surgeon: Jackquline Denmark, MD;  Location[facility] ENDOSCOPY;  Service: Endoscopy;;  . [place]; 03/10/16   1997 Non-obstructive disease.  03/2016 BMS to RCA, with 25% pDiag dz, o/w normal cors per cath 03/07/16.  Cath 11/01/16: in stent restenosis, successful baloon angioplasty. 08/2020 branch vessel dz, cont medical therapy rec'd.  [name] CARDIAC CATHETERIZATION  12/24/2012   mild < 20% LCx, prox 30% RCA; LVEF 55-65% , moderate pulmonary HTN, moderate AS  . CARDIAC DEFIBRILLATOR PLACEMENT  11/07/2017   [facility] MRI Quad CRT defibrillator  . CARDIOVASCULAR STRESS TEST  05/11/16 ([facility])   2017 Myocardial perfusion imaging:  No ischemia; scar in apex, global hypokinesis, EF 36%.  06/15/20->mod primarily fixed inferolat wall defect mildly worse with stress c/w infarct/scar with mild peri-infarct ischemia, normal EF-->for cath per cards.  . Carotid dopplers   03/09/2016   [facility]: no hemodynamically significant stenosis on either side.  . CHOLECYSTECTOMY    . COLONOSCOPY [name] 10/30/2012   Procedure: COLONOSCOPY;  Surgeon: [name], MD;  Location: [facility] ENDOSCOPY;  Service: Endoscopy;  Laterality: [name];  . CORONARY ANGIOPLASTY WITH STENT PLACEMENT  03/2016; 04/2017   2017-[facility]: BMS to RCA-pt was placed on Brilinta.  04/2017: DES to RCA.  . DORSAL SLIT [name] 10/29/2019   for severe phimosis. Procedure: DORSAL SLIT;  Surgeon: Alexis Frock, MD;  Location: [facility] ORS;  Service: Urology;  Laterality: [name];  63 MINS  . ESOPHAGEAL DILATION  05/04/2020   Procedure: ESOPHAGEAL DILATION;  Surgeon: Jackquline Denmark, MD;  Location: [facility] ENDOSCOPY;  Service: Endoscopy;;  . ESOPHAGOGASTRODUODENOSCOPY (EGD) WITH PROPOFOL [name] 05/04/2020   Procedure: ESOPHAGOGASTRODUODENOSCOPY (EGD) WITH PROPOFOL;  Surgeon: Jackquline Denmark, MD;  Location[facility];  Service: Endoscopy;  Laterality: [name];  . EYE SURGERY Bilateral cataract  . HEMORRHOID SURGERY    . INTRAOCULAR LENS INSERTION Bilateral   . KNEE SURGERY Right   . LEFT AND RIGHT HEART CATHETERIZATION WITH CORONARY ANGIOGRAM [name] 12/24/2012   Procedure: LEFT AND RIGHT HEART CATHETERIZATION WITH CORONARY ANGIOGRAM;  Surgeon: [name], MD;  Location: [facility] CATH LAB;  Service: Cardiovascular;  Laterality: [name];  . LEG SURGERY Bilateral    lenghtening   . PACEMAKER PLACEMENT  04/13/2016   2nd deg HB after TAVR, pt had DC MDT PPM placed.  [name] SHOULDER ARTHROSCOPY  08/30/2011   Procedure: ARTHROSCOPY SHOULDER;  Surgeon: [name], MD;  Location: [place];  Service: Orthopedics;  Laterality: Right;  Right Shoulder Arthroscopy, Debridement, and Decompression  . SPINAL CORD STIMULATOR INSERTION [name] 09/10/2015   Procedure: LUMBAR SPINAL CORD STIMULATOR INSERTION;  Surgeon: [name], MD;  Location: [place] NEURO ORS;  Service: Neurosurgery;  Laterality: [name];  . TEE WITHOUT CARDIOVERSION [name] 09/16/2019   Procedure: TRANSESOPHAGEAL  ECHOCARDIOGRAM (TEE);  Surgeon: Skeet Latch, MD;  Location: [place];  Service: Cardiovascular;  Laterality: [name];  . TEE WITHOUT CARDIOVERSION [name] 12/02/2019   +vegetation on AVR and pacer lead in RV.  EF 55-60%, normal wall motion.  Valves function normal.  Procedure: TRANSESOPHAGEAL ECHOCARDIOGRAM (TEE);  Surgeon: Geralynn Rile, MD;  Location: [place];  Service: Cardiovascular;  Laterality: [name];  . 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  R big toe surg due to osteoarth  . TONSILLECTOMY    . traeculectomy Left    eye  . TRANSCATHETER AORTIC VALVE REPLACEMENT, TRANSFEMORAL  04/11/2016  . TRANSESOPHAGEAL ECHOCARDIOGRAM  03/09/2016; 09/2019   [facility]: EF 55-60%, PFO seen with bi-directional shunting, no thrombus in appendage.  09/2019 ->no valvular  vegetations. Small patent foramen ovale with predominantly left to right shunting across the interatrial septum.  . TRANSTHORACIC ECHOCARDIOGRAM  01/2015; 01/2016; 05/18/16; 09/18/16, 05/2017, 08/2017   01/2015 No signif change in aortic stenosis (moderate).  01/2016 Severe LVH w/small LV cavity, EF 60-65%, grade I diast dysfxn.  05/2016 (s/p TAVR): EF 50-55%, grd I DD, biopros AV good.  08/2016--EF 50-55%, LV septal motion c/w conduction abnl, grd I DD,mild MS,bioprosth aortic valve well seated, w/trace AR. 05/2017 TTE EF 35%. 08/2017-EF 35%, mod diff hypokin LV, grd I DD, biopros AV good.   . TRANSTHORACIC ECHOCARDIOGRAM  04/2018; 09/2019   04/2018: EF 40-45%, mod diffuse LV hypokinesis, grd I DD, bioprosth AV well seated, no AS or AR. 09/2019 EF 60-65%, grd I DD, valves fine, including bioprosth AV. 04/29/20 (tech diff) EF 55-60%, grd I DD, vegetation on MV.  [name] VITRECTOMY      Family History  Problem Relation Age of Onset  . Hypertension Mother   . Coronary artery disease Mother   . Heart attack Mother   . Neuropathy Mother   . Pulmonary fibrosis Father        asbestosis      Social History   Socioeconomic History  . Marital  status: Married    Spouse name: Not on file  . Number of children: 0  . Years of education: 35  . Highest education level: Not on file  Occupational History  . Occupation: Engineer, production    Comment: retired  Tobacco Use  . Smoking status: Never Smoker  . Smokeless tobacco: Never Used  Vaping Use  . Vaping Use: Never used  Substance and Sexual Activity  . Alcohol use: No    Alcohol/week: 0.0 standard drinks  . Drug use: No    Types: Oxycodone  . Sexual activity: Not Currently  Other Topics Concern  . Not on file  Social History Narrative   HSG, [name] - BS, [name] - MS-engineering, 2 years on PhD - [place]. Married - '65 - 59yr/divorced; '76- 3 yrs/divorced; '92 . No children. 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 ACP/Living Will - Yes CPR; long-term Mechanical ventilation as long as he was able to cognate; ok for long term artificial nutrition. Precondition being able to cognate and not to have too much pain.    Social Determinants of Health   Financial Resource Strain: Not on file  Food Insecurity: Not on file  Transportation Needs: Not on file  Physical Activity: Not on file  Stress: Not on file  Social Connections: Not on file    Allergies  Allergen Reactions  . Brimonidine Tartrate Shortness Of Breath    Alphagan-Shortness of breath  . Brinzolamide Shortness Of Breath    AZOPT- Shortness of breath  . Latanoprost Shortness Of Breath    XALATAN- Shortness of breath  . Nucynta [Tapentadol] Shortness Of Breath  . Sulfa Antibiotics Palpitations  . Timolol Maleate Shortness Of Breath and Other (See Comments)    TIMOPTIC- Aggravated asthma  . Diltiazem Swelling     leg swelling  . Rofecoxib Swelling     VIOXX- leg swelling  . Vancomycin Hives and Other (See Comments)    Possible "Red Man Syndrome"? > hives/blisters  . Codeine Other (See Comments)    Childhood reaction  . Tamsulosin Other (See Comments)     Dizziness   . Celecoxib Other (See Comments)    CELLBREX-confusion  . Colchicine Diarrhea  diarrhea  . Tape Rash     Current Outpatient Medications:  .  albuterol (PROVENTIL HFA;VENTOLIN HFA) 108 (90 BASE) MCG/ACT inhaler, Inhale 2 puffs into the lungs 4 (four) times daily as needed for wheezing or shortness of breath. (Patient not taking: No sig reported), Disp: , Rfl:  .  allopurinol (ZYLOPRIM) 300 MG tablet, TAKE 1 TABLET BY MOUTH EVERY DAY WITH FOOD, Disp: 90 tablet, Rfl: 0 .  amoxicillin (AMOXIL) 500 MG tablet, Take 2 tablets (1,000 mg total) by mouth 2 (two) times daily., Disp: 120 tablet, Rfl: 11 .  apixaban (ELIQUIS) 5 MG TABS tablet, Take 5 mg by mouth 2 (two) times daily., Disp: , Rfl:  .  ASPIRIN LOW DOSE 81 MG EC tablet, Take 81 mg by mouth daily., Disp: , Rfl:  .  B Complex-C (B-COMPLEX WITH VITAMIN C) tablet, Take 1 tablet by mouth daily. Take 1 tablet daily, 274m, Disp: , Rfl:  .  cadexomer iodine (IODOSORB) 0.9 % gel, Apply 1 application topically daily as needed for wound care. Apply to the affected area daily plus dry dressing, Disp: 40 g, Rfl: 3 .  ezetimibe (ZETIA) 10 MG tablet, Take 1 tablet (10 mg total) by mouth daily., Disp: 30 tablet, Rfl: 1 .  finasteride (PROSCAR) 5 MG tablet, TAKE 1 TABLET BY MOUTH EVERY DAY, Disp: 90 tablet, Rfl: 0 .  fluticasone (FLONASE) 50 MCG/ACT nasal spray, Place 2 sprays into both nostrils as needed for allergies or rhinitis., Disp: , Rfl:  .  gabapentin (NEURONTIN) 800 MG tablet, TAKE 1 TABLET (800 MG TOTAL) BY MOUTH 4 (FOUR) TIMES DAILY., Disp: 360 tablet, Rfl: 2 .  ipratropium (ATROVENT) 0.03 % nasal spray, 2 SPRAY EACH NOSTRIL EVERY 12 HOURS FOR RUNNY NOSE, Disp: 30 mL, Rfl: 2 .  KLOR-CON M20 20 MEQ tablet, TAKE 1 TABLET BY MOUTH EVERY DAY, Disp: 30 tablet, Rfl: 1 .  metoprolol succinate (TOPROL-XL) 25 MG 24 hr tablet, Take 25 mg by mouth daily., Disp: , Rfl:  .  Multiple Vitamin (MULTIVITAMIN ADULT PO), Take by mouth daily., Disp: ,  Rfl:  .  niacin (NIASPAN) 1000 MG CR tablet, TAKE 1 TABLET BY MOUTH TWICE A DAY, Disp: 180 tablet, Rfl: 0 .  nitroGLYCERIN (NITROSTAT) 0.4 MG SL tablet, Place 0.4 mg under the tongue every 5 (five) minutes as needed for chest pain. , Disp: , Rfl:  .  pantoprazole (PROTONIX) 40 MG tablet, TAKE 1 TABLET BY MOUTH EVERY DAY (Patient not taking: Reported on 11/01/2020), Disp: 90 tablet, Rfl: 3 .  rosuvastatin (CRESTOR) 40 MG tablet, take 1 tablet by mouth once daily (Patient taking differently: Take 40 mg by mouth daily.), Disp: 15 tablet, Rfl: 0 .  sacubitril-valsartan (ENTRESTO) 24-26 MG, Take 1 tablet by mouth 2 (two) times daily. , Disp: , Rfl:  .  torsemide (DEMADEX) 20 MG tablet, Take 1 tablet (20 mg total) by mouth in the morning and at bedtime. (Patient not taking: No sig reported), Disp: 60 tablet, Rfl: 0 .  vitamin C (ASCORBIC ACID) 250 MG tablet, Take 250 mg by mouth daily., Disp: , Rfl:  .  VITAMIN D, CHOLECALCIFEROL, PO, Take 1 tablet by mouth daily. , Disp: , Rfl:  .  WIXELA INHUB 250-50 MCG/DOSE AEPB, TAKE 1 PUFF BY MOUTH TWICE A DAY, Disp: 60 each, Rfl: 11   Review of Systems  Constitutional: Negative for activity change, appetite change, chills, diaphoresis, fatigue, fever and unexpected weight change.  HENT: Negative for congestion, rhinorrhea, sinus pressure, sneezing, sore  throat and trouble swallowing.   Eyes: Negative for photophobia and visual disturbance.  Respiratory: Negative for cough, chest tightness, shortness of breath, wheezing and stridor.   Cardiovascular: Negative for chest pain, palpitations and leg swelling.  Gastrointestinal: Negative for abdominal distention, abdominal pain, anal bleeding, blood in stool, constipation, diarrhea, nausea and vomiting.  Genitourinary: Negative for difficulty urinating, dysuria, flank pain and hematuria.  Musculoskeletal: Negative for arthralgias, back pain, gait problem, joint swelling and myalgias.  Skin: Positive for wound. Negative  for color change, pallor and rash.  Neurological: Negative for dizziness, tremors, weakness and light-headedness.  Hematological: Negative for adenopathy. Does not bruise/bleed easily.  Psychiatric/Behavioral: Negative for agitation, behavioral problems, confusion, decreased concentration, dysphoric mood and sleep disturbance.       Objective:   Physical Exam Constitutional:      General: He is not in acute distress.    Appearance: Normal appearance. He is well-developed. He is not ill-appearing or diaphoretic.  HENT:     Head: Normocephalic and atraumatic.     Right Ear: Hearing and external ear normal.     Left Ear: Hearing and external ear normal.     Nose: No nasal deformity or rhinorrhea.  Eyes:     General: No scleral icterus.    Extraocular Movements: Extraocular movements intact.     Conjunctiva/sclera: Conjunctivae normal.     Right eye: Right conjunctiva is not injected.     Left eye: Left conjunctiva is not injected.  Neck:     Vascular: No JVD.  Cardiovascular:     Rate and Rhythm: Normal rate and regular rhythm.     Heart sounds: S1 normal and S2 normal. No murmur heard. No friction rub. No gallop.   Pulmonary:     Effort: Pulmonary effort is normal. No respiratory distress.     Breath sounds: No stridor. No rhonchi.  Abdominal:     General: There is no distension.     Palpations: Abdomen is soft.  Musculoskeletal:        General: Normal range of motion.     Right shoulder: Normal.     Left shoulder: Normal.     Cervical back: Normal range of motion and neck supple.     Right hip: Normal.     Left hip: Normal.     Right knee: Normal.     Left knee: Normal.  Lymphadenopathy:     Head:     Right side of head: No submandibular, preauricular or posterior auricular adenopathy.     Left side of head: No submandibular, preauricular or posterior auricular adenopathy.     Cervical: No cervical adenopathy.     Right cervical: No superficial or deep cervical  adenopathy.    Left cervical: No superficial or deep cervical adenopathy.  Skin:    General: Skin is warm and dry.     Coloration: Skin is not pale.     Findings: No abrasion, bruising, ecchymosis, erythema, lesion or rash.     Nails: There is no clubbing.  Neurological:     General: No focal deficit present.     Mental Status: He is alert and oriented to person, place, and time.     Sensory: No sensory deficit.     Coordination: Coordination normal.     Gait: Gait normal.  Psychiatric:        Attention and Perception: He is attentive.        Mood and Affect: Mood normal.  Speech: Speech normal.        Behavior: Behavior normal. Behavior is cooperative.        Thought Content: Thought content normal.        Judgment: Judgment normal.       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  However he is being followed closely by orthopedic surgery and the wound does not probe to bone.  We will check some labs today including inflammatory markers.  He will continue be followed closely by orthopedics and I will plan on seeing him back in 3 months time.   COVID prevention: For his second booster but we do not have a vial of vaccine open at this point in time in the day but have encouraged him to get one at a local pharmacy  I spent greater than 40 minutes with the patient including greater than 50% of time in face to face counsel of the patient and his wife,  reviewing care of his native and prosthetic valve endocarditis his ICD infection is diabetic foot infection with osteomyelitis review of his radiographic laboratory microbiological data and in coordination of his care.

## 2020-11-25 LAB — C-REACTIVE PROTEIN: CRP: 1.3 mg/L (ref <8.0)

## 2020-11-25 LAB — BASIC METABOLIC PANEL WITH GFR
BUN: 20 mg/dL (ref 7–25)
CO2: 30 mmol/L (ref 20–32)
Calcium: 9 mg/dL (ref 8.6–10.3)
Chloride: 105 mmol/L (ref 98–110)
Creat: 0.76 mg/dL (ref 0.70–1.11)
GFR, Est African American: 99 mL/min/{1.73_m2} (ref >=60)
GFR, Est Non African American: 86 mL/min/{1.73_m2} (ref >=60)
Glucose, Bld: 130 mg/dL — ABNORMAL HIGH (ref 65–99)
Potassium: 4.4 mmol/L (ref 3.5–5.3)
Sodium: 141 mmol/L (ref 135–146)

## 2020-11-25 LAB — SEDIMENTATION RATE: Sed Rate: 34 mm/h — ABNORMAL HIGH (ref 0–20)

## 2020-11-27 ENCOUNTER — Other Ambulatory Visit: Payer: Self-pay | Admitting: Family Medicine

## 2020-11-30 ENCOUNTER — Other Ambulatory Visit: Payer: Self-pay | Admitting: Family Medicine

## 2020-12-02 ENCOUNTER — Telehealth: Payer: Self-pay | Admitting: Family Medicine

## 2020-12-02 NOTE — Telephone Encounter (Signed)
Patient declined AWV do not call

## 2020-12-04 ENCOUNTER — Other Ambulatory Visit: Payer: Self-pay

## 2020-12-04 ENCOUNTER — Emergency Department
Admission: EM | Admit: 2020-12-04 | Discharge: 2020-12-04 | Disposition: A | Discharge status: Home / Self Care | Payer: Medicare Other | Source: Home / Self Care | Attending: Family Medicine | Admitting: Family Medicine

## 2020-12-04 DIAGNOSIS — H9201 Otalgia, right ear: Secondary | ICD-10-CM

## 2020-12-04 MED ORDER — AMOXICILLIN-POT CLAVULANATE 875-125 MG PO TABS: 1.0000 | ORAL_TABLET | Freq: Two times a day (BID) | ORAL | 0 refills | Status: DC

## 2020-12-04 MED ORDER — HYDROCODONE-ACETAMINOPHEN 5-325 MG PO TABS: 1.0000 | ORAL_TABLET | Freq: Four times a day (QID) | ORAL | 0 refills | Status: DC | PRN

## 2020-12-04 NOTE — Discharge Instructions (Addendum)
Be aware, you have been prescribed pain medications that may cause drowsiness. While taking this medication, do not take any other medications containing acetaminophen (Tylenol). Do not combine with alcohol or other illicit drugs. Please do not drive, operate heavy machinery, or take part in activities that require making important decisions while on this medication as your judgement may be clouded.

## 2020-12-04 NOTE — ED Provider Notes (Addendum)
[facility]   [id number] 12/04/20 Arrival Time: 7782  ASSESSMENT & PLAN:  1. Otalgia of right ear     Meds ordered this encounter  Medications  . amoxicillin-clavulanate (AUGMENTIN) 875-125 MG tablet    Sig: Take 1 tablet by mouth 2 (two) times daily.    Dispense:  14 tablet    Refill:  0  . HYDROcodone-acetaminophen (NORCO/VICODIN) 5-325 MG tablet    Sig: Take 1 tablet by mouth every 6 (six) hours as needed for moderate pain or severe pain.    Dispense:  10 tablet    Refill:  0     Discharge Instructions     Be aware, you have been prescribed pain medications that may cause drowsiness. While taking this medication, do not take any other medications containing acetaminophen (Tylenol). Do not combine with alcohol or other illicit drugs. Please do not drive, operate heavy machinery, or take part in activities that require making important decisions while on this medication as your judgement may be clouded.      Follow-up Information    [facility].   Why: If worsening or failing to improve as anticipated. Contact information: [place] [address] [phone]              [place] consulted for this patient. I feel the risk/benefit ratio today is favorable for proceeding with this prescription for a controlled substance. Medication sedation precautions given.  Reviewed expectations re: course of current medical issues. Questions answered. Outlined signs and symptoms indicating need for more acute intervention. Patient verbalized understanding. After Visit Summary given.   SUBJECTIVE: History from: patient.  Tim [name] is a 80 y.o. male who presents with complaint of right otalgia; noted yesterday; radiates to right jaw. No specific ST. Afebrile. Decreased hearing from right ear. No drainage or bleeding. Ear pain interferes with sleep. Requests something for  pain.  Social History   Tobacco Use  Smoking Status Never Smoker  Smokeless Tobacco Never Used    OBJECTIVE:  Vitals:   12/04/20 0914 12/04/20 0921  BP:  132/83  Pulse:  76  Resp:  20  Temp:  99 F (37.2 C)  TempSrc:  Oral  SpO2:  98%  Weight: (!) 153.3 kg   Height: 6' (1.829 m)      General appearance: alert; appears fatigued Ear Canal: normal TM: right: erythematous, bulging Neck: supple without LAD Lungs: unlabored respirations, symmetrical air entry; no respiratory distress Skin: warm and dry Psychological: alert and cooperative; normal mood and affect  Allergies  Allergen Reactions  . Brimonidine Tartrate Shortness Of Breath    Alphagan-Shortness of breath  . Brinzolamide Shortness Of Breath    AZOPT- Shortness of breath  . Latanoprost Shortness Of Breath    XALATAN- Shortness of breath  . Nucynta [Tapentadol] Shortness Of Breath  . Sulfa Antibiotics Palpitations  . Timolol Maleate Shortness Of Breath and Other (See Comments)    TIMOPTIC- Aggravated asthma  . Diltiazem Swelling     leg swelling  . Rofecoxib Swelling     VIOXX- leg swelling  . Vancomycin Hives and Other (See Comments)    Possible "Red Man Syndrome"? > hives/blisters  . Codeine Other (See Comments)    Childhood reaction  . Tamsulosin Other (See Comments)    Dizziness   . Celecoxib Other (See Comments)    CELLBREX-confusion  . Colchicine Diarrhea    diarrhea  . Tape Rash  Past Medical History:  Diagnosis Date  . AICD (automatic cardioverter/defibrillator) present   . Balanitis    +severe phimosis+ buried penis->circ not possible but dorsal slit done 10/2019  . BPH (benign prostatic hypertrophy)    with urinary retention.  Renal u/s 12/04/19 NORMAL KIDNEYS, NORMAL BLADDER, NO HYDRONEPHROSIS  . CAD (coronary artery disease)    Nonobstructive by 12/2012 cath; then 03/2016 he required BMS to RCA ([facility]).  In-stent restenosis on cath 11/01/16, baloon angioplasty successful.  [name] CATARACT,  HX OF   . Chronic combined systolic and diastolic CHF (congestive heart failure) ([place]) 05/2016   Ischemic CM.  04/2018 EF 40-45%, grd I DD.  [name] Chronic pain syndrome    Lumbar DDD; chronic neuropathic pain (DM); has spinal stimulator and sees pain mgmt MD  . Complicated UTI (urinary tract infection) 09/2019   Phimoses, acute urinary retention, entoerococcus UTI, enterococc bacteremia.  F/u blood clx's neg x 5d.    [name] COPD   . Diabetes mellitus type 2 with complications (HCC)    WNI6E jumped from 5.7% to 6.1% 03/2015---started metformin at that time.  DM 2 dx by fasting gluc criteria 2018.  Has chronic neuropath pain  . Enterococcal bacteremia 09/2019   Phimoses, acute urinary retention, entoerococcus UTI, enterococc bacteremia.  F/u blood clx's neg x 5d.    . Essential hypertension, benign   . Fatty liver 2007   2007 u/s showed fatty liver with hepatosplenomegaly.  2019 repeat u/s->fatty liver but no cirrhosis or hepatosplenomegaly.  [name] GERD (gastroesophageal reflux disease)    + hx of esoph stenosis, +dilation  . Glaucoma   . GOUT   . HH (hiatus hernia)   . HYPERCHOLESTEROLEMIA-PURE   . Hypogonadism male   . ICD (implantable cardioverter-defibrillator) infection ([place]) 12/22/2019  . 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 Tommy Medal)  . Lumbosacral neuritis   . Lumbosacral spondylosis    Lumbar spinal stenosis with neurogenic claudication--contributes to his chronic pain syndrome  . Normal memory function 08/2014   Neuropsychological testing ([facility]): no cognitive impairment or sign of neurodegenerative disorder.  Likely has adjustment d/o with mixed anxiety/depressed features and may benefit from low dose SNRI.    [name] Normocytic anemia 03/2016   Mild-pt needs ferritin and vit B12 level checked (as of 03/22/16). Hb stable 09/2019  . NSTEMI (non-ST elevated myocardial infarction) ([place])  03/20/2016   BMS to RCA  . Obesity hypoventilation syndrome ([place])   . Orthostatic hypotension   . OSA on CPAP    8 cm H2O  . OSTEOARTHRITIS   . PAF (paroxysmal atrial fibrillation) ([place])   . Paroxysmal atrial fibrillation ([place]) 2003    (? chronic?) Off anticoag for a while due to falls.  Then apixaban started 12/2014.  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 Ella Bodo): neuropathic pain->diff to treat, failed nucynta, failed spinal stimul trial, oxycontin hs + tramadol + gabap as of 12/2017 f/u Dr. Letta Pate.  . Personal history of colonic adenoma 10/30/2012   Diminutive adenoma, consider repeat 2019 per GI  . PFO (patent foramen ovale) 09/2019   small, with predominately L to R shunt  . Presence of cardiac defibrillator 11/07/2017  . Primary osteoarthritis of both knees    Bone on bone of medial compartments, + signif patellofemoral arth bilat.--supartz inj series started 09/12/17  . 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 PUD (peptic ulcer disease)   .  PULMONARY HYPERTENSION, HX OF   . Rhinitis 07/14/2020  . Secondary male hypogonadism 2017  . Sepsis ([place]) 04/29/2020  . Severe aortic stenosis    TAVR 04/11/16 ([facility])  . Shortness of breath    with exertion: much improved s/p TAVR and treatment for CHF.  . Sick sinus syndrome (HCC)    PPM placed  . Thrombocytopenia ([place]) 2018   HSM on 2007 abd u/s---suspect some mild splenic sequestration chronically.  [name] Unspecified glaucoma(365.9)   . Unspecified hereditary and idiopathic peripheral neuropathy approx age 69   bilat LE's, ? left arm, too.  Feet became progressively numb + left foot pain intermittently.  Pt may be trying a spinal stimulator (as of 05/2015)  . VENOUS INSUFFICIENCY    Being followed by Dr. Sharol Given as of 10/2016 for two R LL venous stasis ulcers/skin tears.  Healed as of 10/30/16 f/u with Dr. Sharol Given.  . VENTRAL HERNIA   . Wound dehiscence 10/22/2019   Family History  Problem  Relation Age of Onset  . Hypertension Mother   . Coronary artery disease Mother   . Heart attack Mother   . Neuropathy Mother   . Pulmonary fibrosis Father        asbestosis   Social History   Socioeconomic History  . Marital status: Married    Spouse name: Not on file  . Number of children: 0  . Years of education: 64  . Highest education level: Not on file  Occupational History  . Occupation: Engineer, production    Comment: retired  Tobacco Use  . Smoking status: Never Smoker  . Smokeless tobacco: Never Used  Vaping Use  . Vaping Use: Never used  Substance and Sexual Activity  . Alcohol use: No    Alcohol/week: 0.0 standard drinks  . Drug use: No    Types: Oxycodone  . Sexual activity: Not Currently  Other Topics Concern  . Not on file  Social History Narrative   HSG, [name] - BS, [name] - MS-engineering, 2 years on PhD - [employer]. Married - '65 - 27yr/divorced; '76- 3 yrs/divorced; '92 . No children. 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 ACP/Living Will - Yes CPR; long-term Mechanical ventilation as long as he was able to cognate; ok for long term artificial nutrition. Precondition being able to cognate and not to have too much pain.    Social Determinants of Health   Financial Resource Strain: Not on file  Food Insecurity: Not on file  Transportation Needs: Not on file  Physical Activity: Not on file  Stress: Not on file  Social Connections: Not on file  Intimate Partner Violence: Not on file            HVanessa Kick MD 12/04/20 [id number]   HVanessa Kick MD 12/04/20 [phone]

## 2020-12-04 NOTE — ED Triage Notes (Signed)
Pt presents to Urgent Care with c/o R jaw and ear pain x 1 day. Reports that he cannot completely close his mouth d/t R jaw pain.

## 2020-12-08 ENCOUNTER — Ambulatory Visit (INDEPENDENT_AMBULATORY_CARE_PROVIDER_SITE_OTHER): Payer: Medicare Other | Admitting: Family

## 2020-12-08 ENCOUNTER — Encounter: Payer: Self-pay | Admitting: Family

## 2020-12-08 DIAGNOSIS — Z89432 Acquired absence of left foot: Secondary | ICD-10-CM | POA: Diagnosis not present

## 2020-12-08 DIAGNOSIS — L97521 Non-pressure chronic ulcer of other part of left foot limited to breakdown of skin: Secondary | ICD-10-CM | POA: Diagnosis not present

## 2020-12-08 NOTE — Progress Notes (Signed)
Office Visit Note   Patient: Tim [name]           Date of Birth: 1940-10-10           MRN: [id number] Visit Date: 12/08/2020              Requested by: Tammi Sou, MD [address] [place],  [place] [address] PCP: Tammi Sou, MD  No chief complaint on file.     HPI: Is an 80 year old gentleman who presents today in routine follow-up for Wagner grade 1 ulcer to his transmetatarsal amputation on the left.  He currently uses CircAid stockings for his lymphatic and venous edema.  He has been doing Iodosorb dressings now for quite some time.  Comes in for routine trimming of his nonviable callus tissue.    Assessment & Plan: Visit Diagnoses:  1. Non-pressure chronic ulcer of other part of left foot limited to breakdown of skin ([place])   2. History of transmetatarsal amputation of left foot ([place])     Plan: Continue with his current compression. continue with Iodosorb dressing changes.  Follow-Up Instructions: Return in about 3 weeks (around 12/29/2020).   Ortho Exam  [name], oriented, no adenopathy, well-dressed, normal affect, normal respiratory effort.  On examination of the left lower extremity he does have hypertrophic ulcer to the end of his transmetatarsal amputation this is 3 cm in diameter after debridement of nonviable tissue Silver nitrate was used for hemostasis.  Ulcer is 3 cm in diameter 50% filled in with hypergranulation tissue 50% granulation tissue hypergranulation tissue touched with silver nitrate.  Mupirocin dressing applied.  There is no redness no cellulitis no signs of infection.  The ulcer does not probe to bone or tendon there is no tunneling.  Imaging: No results found. No images are attached to the encounter.  Labs: Lab Results  Component Value Date   HGBA1C 5.5 11/01/2020   HGBA1C 5.5 11/01/2020   HGBA1C 5.5 (A) 11/01/2020   HGBA1C 5.5 11/01/2020   ESRSEDRATE 34 (H) 11/24/2020   ESRSEDRATE 38 (H) 07/14/2020    ESRSEDRATE 20 02/09/2016   CRP 1.3 11/24/2020   CRP 0.9 07/14/2020   CRP 0.7 05/03/2020   LABURIC 3.5 (L) 09/10/2013   REPTSTATUS 05/04/2020 FINAL 04/28/2020   CULT  04/28/2020    NO GROWTH 5 DAYS Performed at [facility], [place] [address] [place], [place] [address]    LABORGA ENTEROCOCCUS FAECALIS 09/11/2019     Lab Results  Component Value Date   ALBUMIN 3.0 (L) 05/07/2020   ALBUMIN 2.9 (L) 05/06/2020   ALBUMIN 3.0 (L) 05/05/2020    Lab Results  Component Value Date   MG 1.7 05/07/2020   MG 1.6 (L) 05/06/2020   MG 1.8 05/05/2020   No results found for: VD25OH  No results found for: PREALBUMIN CBC EXTENDED Latest Ref Rng & Units 07/14/2020 05/07/2020 05/06/2020  WBC 3.8 - 10.8 Thousand/uL 7.2 5.5 5.6  RBC 4.20 - 5.80 Million/uL 4.11(L) 3.54(L) 3.41(L)  HGB 13.2 - 17.1 g/dL 12.2(L) 10.2(L) 10.1(L)  HCT 38.5 - 50.0 % 36.7(L) 32.1(L) 31.0(L)  PLT 140 - 400 Thousand/uL 121(L) 133(L) 129(L)  NEUTROABS 1,500 - 7,800 cells/uL 4,694 3.0 3.2  LYMPHSABS 850 - 3,900 cells/uL 1,390 1.6 1.6     There is no height or weight on file to calculate BMI.  Orders:  No orders of the defined types were placed in this encounter.  No orders of the defined types  were placed in this encounter.    Procedures: No procedures performed  Clinical Data: No additional findings.  ROS:  All other systems negative, except as noted in the HPI. Review of Systems  Constitutional: Negative for chills and fever.  Cardiovascular: Positive for leg swelling.  Skin: Positive for wound. Negative for color change.    Objective: Vital Signs: There were no vitals taken for this visit.  Specialty Comments:  No specialty comments available.  PMFS History: Patient Active Problem List   Diagnosis Date Noted  . Rhinitis 07/14/2020  . Mitral valve vegetation 05/28/2020  . Non-pressure chronic ulcer of other part of left foot limited to breakdown of skin ([place])   . Esophageal dysphagia   .  Bacteremia   . Endocarditis of mitral valve   . Fever 04/28/2020  . Cervical lymphadenopathy 03/15/2020  . 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 ICD (implantable cardioverter-defibrillator) infection ([place]) 12/22/2019  . Gram-positive bacteremia 11/27/2019  . FUO (fever of unknown origin) 11/26/2019  . Low blood pressure reading 11/05/2019  . Wound dehiscence 10/22/2019  . PFO (patent foramen ovale) 10/06/2019  . ICD (implantable cardioverter-defibrillator) battery depletion   . Pressure injury of skin 09/13/2019  . Enterococcal bacteremia history  09/13/2019  . 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  . 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 Urinary retention 09/13/2019  . Phimosis 09/13/2019  . SIRS (systemic inflammatory response syndrome) ([place]) 09/12/2019  . Sepsis ([place]) 09/12/2019  . Enterococcus UTI 09/12/2019  . Cellulitis of foot   . History of transmetatarsal amputation of left foot ([place])   . Subacute osteomyelitis, left ankle and foot ([place])   . 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 Diabetic neuropathy, painful ([place]) 12/26/2016  . Neuropathic pain of foot, left 06/20/2016  . 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 Status post amputation of left great toe ([place]) 06/08/2015  . Spinal stenosis of lumbar region 02/16/2015  . Hereditary and idiopathic peripheral neuropathy 01/12/2015  . Oral thrush 12/28/2014  . Orthostatic dizziness 05/24/2014  . Paresthesias with subjective weakness 01/30/2014  . Bilateral thigh pain 01/30/2014  . Abdominal wall pain in right upper quadrant 01/22/2014  . Gross hematuria 09/25/2013  . Intertrigo 09/25/2013  . IBS (irritable bowel syndrome) 09/11/2013  . Chronic pain syndrome 08/06/2013  . CAD (coronary artery disease), native coronary artery 12/25/2012  . Paroxysmal atrial fibrillation ([place]) 12/25/2012  .  Moderate aortic stenosis 12/25/2012  . Aortic stenosis 12/24/2012  . Personal history of colonic adenoma 10/30/2012  . Diverticulosis of colon (without mention of hemorrhage) 10/30/2012  . Internal hemorrhoids 10/30/2012  . Change in bowel habits intermittent loose stools 10/30/2012  . Routine health maintenance 06/09/2012  . Hereditary peripheral neuropathy 10/10/2011  . Long term current use of anticoagulant - apixiban 08/16/2010  . Dyspnea on exertion 09/08/2009  . HYPERCHOLESTEROLEMIA-PURE 01/13/2009  . Essential hypertension, benign 01/13/2009  . EDEMA 01/13/2009  . GOUT 01/12/2009  . GLAUCOMA 01/12/2009  . Venous insufficiency (chronic) (peripheral) 01/12/2009  . COPD (chronic obstructive pulmonary disease) ([place]) 01/12/2009  . VENTRAL HERNIA 01/12/2009  . OSTEOARTHRITIS 01/12/2009  . ARTHRITIS 01/12/2009  . DEGENERATIVE DISC DISEASE 01/12/2009  . CATARACT, HX OF 01/12/2009  . HEART MURMUR, HX OF 01/12/2009  . BENIGN PROSTATIC HYPERTROPHY, HX OF 01/12/2009  . Obesity 01/17/2007  . OBSTRUCTIVE SLEEP APNEA 01/17/2007  . ASTHMA 01/17/2007  . GERD 01/17/2007  . HALLUX VALGUS, ACQUIRED 01/17/2007  . Pulmonary hypertension (  [place]) 01/17/2007   Past Medical History:  Diagnosis Date  . AICD (automatic cardioverter/defibrillator) present   . Balanitis    +severe phimosis+ buried penis->circ not possible but dorsal slit done 10/2019  . BPH (benign prostatic hypertrophy)    with urinary retention.  Renal u/s 12/04/19 NORMAL KIDNEYS, NORMAL BLADDER, NO HYDRONEPHROSIS  . CAD (coronary artery disease)    Nonobstructive by 12/2012 cath; then 03/2016 he required BMS to RCA ([facility]).  In-stent restenosis on cath 11/01/16, baloon angioplasty successful.  [name] CATARACT, HX OF   . Chronic combined systolic and diastolic CHF (congestive heart failure) ([place]) 05/2016   Ischemic CM.  04/2018 EF 40-45%, grd I DD.  [name] Chronic pain syndrome    Lumbar DDD; chronic neuropathic pain (DM); has spinal  stimulator and sees pain mgmt MD  . Complicated UTI (urinary tract infection) 09/2019   Phimoses, acute urinary retention, entoerococcus UTI, enterococc bacteremia.  F/u blood clx's neg x 5d.    [name] COPD   . Diabetes mellitus type 2 with complications (HCC)    WLS9H jumped from 5.7% to 6.1% 03/2015---started metformin at that time.  DM 2 dx by fasting gluc criteria 2018.  Has chronic neuropath pain  . Enterococcal bacteremia 09/2019   Phimoses, acute urinary retention, entoerococcus UTI, enterococc bacteremia.  F/u blood clx's neg x 5d.    . Essential hypertension, benign   . Fatty liver 2007   2007 u/s showed fatty liver with hepatosplenomegaly.  2019 repeat u/s->fatty liver but no cirrhosis or hepatosplenomegaly.  [name] GERD (gastroesophageal reflux disease)    + hx of esoph stenosis, +dilation  . Glaucoma   . GOUT   . HH (hiatus hernia)   . HYPERCHOLESTEROLEMIA-PURE   . Hypogonadism male   . ICD (implantable cardioverter-defibrillator) infection ([place]) 12/22/2019  . 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 Tommy Medal)  . Lumbosacral neuritis   . Lumbosacral spondylosis    Lumbar spinal stenosis with neurogenic claudication--contributes to his chronic pain syndrome  . Normal memory function 08/2014   Neuropsychological testing ([facility]): no cognitive impairment or sign of neurodegenerative disorder.  Likely has adjustment d/o with mixed anxiety/depressed features and may benefit from low dose SNRI.    [name] Normocytic anemia 03/2016   Mild-pt needs ferritin and vit B12 level checked (as of 03/22/16). Hb stable 09/2019  . NSTEMI (non-ST elevated myocardial infarction) ([place]) 03/20/2016   BMS to RCA  . Obesity hypoventilation syndrome ([place])   . Orthostatic hypotension   . OSA on CPAP    8 cm H2O  . OSTEOARTHRITIS   . PAF (paroxysmal atrial fibrillation) ([place])   . Paroxysmal atrial  fibrillation ([place]) 2003    (? chronic?) Off anticoag for a while due to falls.  Then apixaban started 12/2014.  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 Ella Bodo): neuropathic pain->diff to treat, failed nucynta, failed spinal stimul trial, oxycontin hs + tramadol + gabap as of 12/2017 f/u Dr. Letta Pate.  . Personal history of colonic adenoma 10/30/2012   Diminutive adenoma, consider repeat 2019 per GI  . PFO (patent foramen ovale) 09/2019   small, with predominately L to R shunt  . Presence of cardiac defibrillator 11/07/2017  . Primary osteoarthritis of both knees    Bone on bone of medial compartments, + signif patellofemoral arth bilat.--supartz inj series started 09/12/17  . 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 PUD (  peptic ulcer disease)   . PULMONARY HYPERTENSION, HX OF   . Rhinitis 07/14/2020  . Secondary male hypogonadism 2017  . Sepsis ([place]) 04/29/2020  . Severe aortic stenosis    TAVR 04/11/16 ([facility])  . Shortness of breath    with exertion: much improved s/p TAVR and treatment for CHF.  . Sick sinus syndrome (HCC)    PPM placed  . Thrombocytopenia ([place]) 2018   HSM on 2007 abd u/s---suspect some mild splenic sequestration chronically.  [name] Unspecified glaucoma(365.9)   . Unspecified hereditary and idiopathic peripheral neuropathy approx age 18   bilat LE's, ? left arm, too.  Feet became progressively numb + left foot pain intermittently.  Pt may be trying a spinal stimulator (as of 05/2015)  . VENOUS INSUFFICIENCY    Being followed by Dr. Sharol Given as of 10/2016 for two R LL venous stasis ulcers/skin tears.  Healed as of 10/30/16 f/u with Dr. Sharol Given.  . VENTRAL HERNIA   . Wound dehiscence 10/22/2019    Family History  Problem Relation Age of Onset  . Hypertension Mother   . Coronary artery disease Mother   . Heart attack Mother   . Neuropathy Mother   . Pulmonary fibrosis Father        asbestosis    Past Surgical History:   Procedure Laterality Date  . AMPUTATION Left 04/11/2013   Procedure: AMPUTATION DIGIT Left 3rd toe;  Surgeon: [name], MD;  Location: [place];  Service: Orthopedics;  Laterality: Left;  Left 3rd toe amputation at [facility]  . AMPUTATION Left 08/29/2019   Procedure: LEFT TRANSMETATARSAL AMPUTATION;  Surgeon: [name], MD;  Location: [place];  Service: Orthopedics;  Laterality: Left;  . BIOPSY  05/04/2020   Procedure: BIOPSY;  Surgeon: Jackquline Denmark, MD;  [facility] ENDOSCOPY;  Service: Endoscopy;;  . [place]; 03/10/16   1997 Non-obstructive disease.  03/2016 BMS to RCA, with 25% pDiag dz, o/w normal cors per cath 03/07/16.  Cath 11/01/16: in stent restenosis, successful baloon angioplasty. 08/2020 branch vessel dz, cont medical therapy rec'd.  [name] CARDIAC CATHETERIZATION  12/24/2012   mild < 20% LCx, prox 30% RCA; LVEF 55-65% , moderate pulmonary HTN, moderate AS  . CARDIAC DEFIBRILLATOR PLACEMENT  11/07/2017   [facility] MRI Quad CRT defibrillator  . CARDIOVASCULAR STRESS TEST  05/11/16 ([facility])   2017 Myocardial perfusion imaging:  No ischemia; scar in apex, global hypokinesis, EF 36%.  06/15/20->mod primarily fixed inferolat wall defect mildly worse with stress c/w infarct/scar with mild peri-infarct ischemia, normal EF-->for cath per cards.  . Carotid dopplers  03/09/2016   [facility]: no hemodynamically significant stenosis on either side.  . CHOLECYSTECTOMY    . COLONOSCOPY [name] 10/30/2012   Procedure: COLONOSCOPY;  Surgeon: [name], MD;  Location: [facility] ENDOSCOPY;  Service: Endoscopy;  Laterality: [name];  . CORONARY ANGIOPLASTY WITH STENT PLACEMENT  03/2016; 04/2017   2017-[facility]: BMS to RCA-pt was placed on Brilinta.  04/2017: DES to RCA.  . DORSAL SLIT [name] 10/29/2019   for severe phimosis. Procedure: DORSAL SLIT;  Surgeon: Alexis Frock, MD;  Location: [facility]S;  Service: Urology;  Laterality: [name];  69 MINS  . ESOPHAGEAL DILATION  05/04/2020   Procedure: ESOPHAGEAL DILATION;   Surgeon: Jackquline Denmark, MD;  Location: [facility] ENDOSCOPY;  Service: Endoscopy;;  . ESOPHAGOGASTRODUODENOSCOPY (EGD) WITH PROPOFOL [name] 05/04/2020   Procedure: ESOPHAGOGASTRODUODENOSCOPY (EGD) WITH PROPOFOL;  Surgeon: Jackquline Denmark, MD;  Location: [facility]COPY;  Service: Endoscopy;  Laterality: [name];  . EYE SURGERY  Bilateral cataract  . HEMORRHOID SURGERY    . INTRAOCULAR LENS INSERTION Bilateral   . KNEE SURGERY Right   . LEFT AND RIGHT HEART CATHETERIZATION WITH CORONARY ANGIOGRAM [name] 12/24/2012   Procedure: LEFT AND RIGHT HEART CATHETERIZATION WITH CORONARY ANGIOGRAM;  Surgeon: [name], MD;  Location: [facility] CATH LAB;  Service: Cardiovascular;  Laterality: [name];  . LEG SURGERY Bilateral    lenghtening   . PACEMAKER PLACEMENT  04/13/2016   2nd deg HB after TAVR, pt had DC MDT PPM placed.  [name] SHOULDER ARTHROSCOPY  08/30/2011   Procedure: ARTHROSCOPY SHOULDER;  Surgeon: [name], MD;  Location: [place];  Service: Orthopedics;  Laterality: Right;  Right Shoulder Arthroscopy, Debridement, and Decompression  . SPINAL CORD STIMULATOR INSERTION [name] 09/10/2015   Procedure: LUMBAR SPINAL CORD STIMULATOR INSERTION;  Surgeon: [name], MD;  Location: [place] [facility]URO ORS;  Service: Neurosurgery;  Laterality: [name];  . TEE WITHOUT CARDIOVERSION [name] 09/16/2019   Procedure: TRANSESOPHAGEAL ECHOCARDIOGRAM (TEE);  Surgeon: Skeet Latch, MD;  Location: [place];  Service: Cardiovascular;  Laterality: [name];  . TEE WITHOUT CARDIOVERSION [name] 12/02/2019   +vegetation on AVR and pacer lead in RV.  EF 55-60%, normal wall motion.  Valves function normal.  Procedure: TRANSESOPHAGEAL ECHOCARDIOGRAM (TEE);  Surgeon: Geralynn Rile, MD;  Location: [place];  Service: Cardiovascular;  Laterality: [name];  . 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  R big toe surg due to osteoarth  . TONSILLECTOMY    . traeculectomy Left    eye  . TRANSCATHETER AORTIC VALVE REPLACEMENT, TRANSFEMORAL  04/11/2016  . TRANSESOPHAGEAL  ECHOCARDIOGRAM  03/09/2016; 09/2019   [facility]: EF 55-60%, PFO seen with bi-directional shunting, no thrombus in appendage.  09/2019 ->no valvular vegetations. Small patent foramen ovale with predominantly left to right shunting across the interatrial septum.  . TRANSTHORACIC ECHOCARDIOGRAM  01/2015; 01/2016; 05/18/16; 09/18/16, 05/2017, 08/2017   01/2015 No signif change in aortic stenosis (moderate).  01/2016 Severe LVH w/small LV cavity, EF 60-65%, grade I diast dysfxn.  05/2016 (s/p TAVR): EF 50-55%, grd I DD, biopros AV good.  08/2016--EF 50-55%, LV septal motion c/w conduction abnl, grd I DD,mild MS,bioprosth aortic valve well seated, w/trace AR. 05/2017 TTE EF 35%. 08/2017-EF 35%, mod diff hypokin LV, grd I DD, biopros AV good.   . TRANSTHORACIC ECHOCARDIOGRAM  04/2018; 09/2019   04/2018: EF 40-45%, mod diffuse LV hypokinesis, grd I DD, bioprosth AV well seated, no AS or AR. 09/2019 EF 60-65%, grd I DD, valves fine, including bioprosth AV. 04/29/20 (tech diff) EF 55-60%, grd I DD, vegetation on MV.  [name] VITRECTOMY     Social History   Occupational History  . Occupation: Engineer, production    Comment: retired  Tobacco Use  . Smoking status: Never Smoker  . Smokeless tobacco: Never Used  Vaping Use  . Vaping Use: Never used  Substance and Sexual Activity  . Alcohol use: No    Alcohol/week: 0.0 standard drinks  . Drug use: No    Types: Oxycodone  . Sexual activity: Not Currently

## 2020-12-29 ENCOUNTER — Encounter: Payer: Self-pay | Admitting: Family

## 2020-12-29 ENCOUNTER — Ambulatory Visit (INDEPENDENT_AMBULATORY_CARE_PROVIDER_SITE_OTHER): Payer: Medicare Other | Admitting: Family

## 2020-12-29 DIAGNOSIS — L97521 Non-pressure chronic ulcer of other part of left foot limited to breakdown of skin: Secondary | ICD-10-CM | POA: Diagnosis not present

## 2020-12-29 NOTE — Progress Notes (Signed)
Office Visit Note   Patient: Tim [name]           Date of Birth: 08-30-1940           MRN: [id number] Visit Date: 12/29/2020              Requested by: Tammi Sou, MD [address] [place],  [place] [address] PCP: Tammi Sou, MD  No chief complaint on file.     HPI: Is an 80 year old gentleman who presents today in routine follow-up for Wagner grade 1 ulcer to his transmetatarsal amputation on the left.  He currently uses CircAid stockings for his lymphatic and venous edema.  He has been doing Iodosorb dressings now for quite some time.  Comes in for routine trimming of his nonviable callus tissue.    Assessment & Plan: Visit Diagnoses:  No diagnosis found.   Plan: Continue with his current compression. continue with Iodosorb dressing changes.  Follow-Up Instructions: Return in about 3 weeks (around 01/19/2021).   Ortho Exam  [name], oriented, no adenopathy, well-dressed, normal affect, normal respiratory effort.  On examination of the left lower extremity he does have hypertrophic ulcer to the end of his transmetatarsal amputation this is 3 cm in diameter after debridement of nonviable tissue Silver nitrate was used for hemostasis.  Ulcer is 3 cm in diameter 50% filled in with hypergranulation tissue 50% granulation tissue hypergranulation tissue touched with silver nitrate.  Mupirocin dressing applied.  There is no redness no cellulitis no signs of infection.  The ulcer does not probe to bone or tendon there is no tunneling.  Imaging: No results found. No images are attached to the encounter.  Labs: Lab Results  Component Value Date   HGBA1C 5.5 11/01/2020   HGBA1C 5.5 11/01/2020   HGBA1C 5.5 (A) 11/01/2020   HGBA1C 5.5 11/01/2020   ESRSEDRATE 34 (H) 11/24/2020   ESRSEDRATE 38 (H) 07/14/2020   ESRSEDRATE 20 02/09/2016   CRP 1.3 11/24/2020   CRP 0.9 07/14/2020   CRP 0.7 05/03/2020   LABURIC 3.5 (L) 09/10/2013   REPTSTATUS 05/04/2020  FINAL 04/28/2020   CULT  04/28/2020    NO GROWTH 5 DAYS Performed at [facility], [place] [address], [place], [place] [address]    LABORGA ENTEROCOCCUS FAECALIS 09/11/2019     Lab Results  Component Value Date   ALBUMIN 3.0 (L) 05/07/2020   ALBUMIN 2.9 (L) 05/06/2020   ALBUMIN 3.0 (L) 05/05/2020    Lab Results  Component Value Date   MG 1.7 05/07/2020   MG 1.6 (L) 05/06/2020   MG 1.8 05/05/2020   No results found for: VD25OH  No results found for: PREALBUMIN CBC EXTENDED Latest Ref Rng & Units 07/14/2020 05/07/2020 05/06/2020  WBC 3.8 - 10.8 Thousand/uL 7.2 5.5 5.6  RBC 4.20 - 5.80 Million/uL 4.11(L) 3.54(L) 3.41(L)  HGB 13.2 - 17.1 g/dL 12.2(L) 10.2(L) 10.1(L)  HCT 38.5 - 50.0 % 36.7(L) 32.1(L) 31.0(L)  PLT 140 - 400 Thousand/uL 121(L) 133(L) 129(L)  NEUTROABS 1,500 - 7,800 cells/uL 4,694 3.0 3.2  LYMPHSABS 850 - 3,900 cells/uL 1,390 1.6 1.6     There is no height or weight on file to calculate BMI.  Orders:  No orders of the defined types were placed in this encounter.  No orders of the defined types were placed in this encounter.    Procedures: No procedures performed  Clinical Data: No additional findings.  ROS:  All other systems negative, except  as noted in the HPI. Review of Systems  Constitutional:  Negative for chills and fever.  Cardiovascular:  Positive for leg swelling.  Skin:  Positive for wound. Negative for color change.   Objective: Vital Signs: There were no vitals taken for this visit.  Specialty Comments:  No specialty comments available.  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   ICD (implantable cardioverter-defibrillator) infection ([place]) 12/22/2019   Gram-positive bacteremia 11/27/2019   FUO (fever of unknown origin) 11/26/2019   Low blood pressure reading 11/05/2019   Wound dehiscence 10/22/2019   PFO (patent foramen ovale) 10/06/2019   ICD (implantable cardioverter-defibrillator) battery depletion    Pressure injury of skin 09/13/2019   Enterococcal bacteremia history  09/13/2019   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   Urinary retention 09/13/2019   Phimosis 09/13/2019   SIRS (systemic inflammatory response syndrome) ([place]) 09/12/2019   Sepsis ([place]) 09/12/2019   Enterococcus UTI 09/12/2019   Cellulitis of foot    History of transmetatarsal amputation of left foot (HCC)    Subacute osteomyelitis, left ankle and foot ([place])    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 ([place]) 04/17/2018   Diabetic neuropathy, painful ([place]) 12/26/2016   Neuropathic pain of foot, left 06/20/2016   Insulin resistance 07/22/2015   Status post amputation of left great toe ([place]) 06/08/2015   Spinal stenosis of lumbar region 02/16/2015   Hereditary and idiopathic peripheral neuropathy 01/12/2015   Oral thrush 12/28/2014   Orthostatic dizziness 05/24/2014   Paresthesias with subjective weakness 01/30/2014   Bilateral thigh pain 01/30/2014   Abdominal wall pain in right upper quadrant 01/22/2014   Gross hematuria 09/25/2013   Intertrigo 09/25/2013   IBS (irritable bowel syndrome) 09/11/2013   Chronic pain syndrome 08/06/2013   CAD (coronary artery disease), native coronary artery 12/25/2012   Paroxysmal atrial fibrillation ([place]) 12/25/2012   Moderate aortic stenosis 12/25/2012   Aortic stenosis 12/24/2012   Personal history of colonic adenoma 10/30/2012   Diverticulosis of colon (without mention of hemorrhage) 10/30/2012    Internal hemorrhoids 10/30/2012   Change in bowel habits intermittent loose stools 10/30/2012   Routine health maintenance 06/09/2012   Hereditary peripheral neuropathy 10/10/2011   Long term current use of anticoagulant - apixiban 08/16/2010   Dyspnea on exertion 09/08/2009   HYPERCHOLESTEROLEMIA-PURE 01/13/2009   Essential hypertension, benign 01/13/2009   EDEMA 01/13/2009   GOUT 01/12/2009   GLAUCOMA 01/12/2009   Venous insufficiency (chronic) (peripheral) 01/12/2009   COPD (chronic obstructive pulmonary disease) ([place]) 01/12/2009   VENTRAL HERNIA 01/12/2009   OSTEOARTHRITIS 01/12/2009   ARTHRITIS 01/12/2009   DEGENERATIVE DISC DISEASE 01/12/2009   CATARACT, HX OF 01/12/2009   HEART MURMUR, HX OF 01/12/2009   BENIGN PROSTATIC HYPERTROPHY, HX OF 01/12/2009   Obesity 01/17/2007   OBSTRUCTIVE SLEEP APNEA 01/17/2007   ASTHMA 01/17/2007   GERD 01/17/2007   HALLUX VALGUS, ACQUIRED 01/17/2007   Pulmonary hypertension ([place]) 01/17/2007   Past Medical History:  Diagnosis Date   AICD (automatic cardioverter/defibrillator) present    Balanitis    +severe  phimosis+ buried penis->circ not possible but dorsal slit done 10/2019   BPH (benign prostatic hypertrophy)    with urinary retention.  Renal u/s 12/04/19 NORMAL KIDNEYS, NORMAL BLADDER, NO HYDRONEPHROSIS   CAD (coronary artery disease)    Nonobstructive by 12/2012 cath; then 03/2016 he required BMS to RCA ([facility]).  In-stent restenosis on cath 11/01/16, baloon angioplasty successful.   CATARACT, HX OF    Chronic combined systolic and diastolic CHF (congestive heart failure) ([place]) 05/2016   Ischemic CM.  04/2018 EF 40-45%, grd I DD.   Chronic pain syndrome    Lumbar DDD; chronic neuropathic pain (DM); has spinal stimulator and sees pain mgmt MD   Complicated UTI (urinary tract infection) 09/2019   Phimoses, acute urinary retention, entoerococcus UTI, enterococc bacteremia.  F/u blood clx's neg x 5d.     COPD    Diabetes mellitus type 2  with complications ([place])    QPR9F jumped from 5.7% to 6.1% 03/2015---started metformin at that time.  DM 2 dx by fasting gluc criteria 2018.  Has chronic neuropath pain   Enterococcal bacteremia 09/2019   Phimoses, acute urinary retention, entoerococcus UTI, enterococc bacteremia.  F/u blood clx's neg x 5d.     Essential hypertension, benign    Fatty liver 2007   2007 u/s showed fatty liver with hepatosplenomegaly.  2019 repeat u/s->fatty liver but no cirrhosis or hepatosplenomegaly.   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 Tommy Medal)   Lumbosacral neuritis    Lumbosacral spondylosis    Lumbar spinal stenosis with neurogenic claudication--contributes to his chronic pain syndrome   Normal memory function 08/2014   Neuropsychological testing ([facility]): no cognitive impairment or sign of neurodegenerative disorder.  Likely has adjustment d/o with mixed anxiety/depressed features and may benefit from low dose SNRI.     Normocytic anemia 03/2016   Mild-pt needs ferritin and vit B12 level checked (as of 03/22/16). Hb stable 09/2019   NSTEMI (non-ST elevated myocardial infarction) ([place]) 03/20/2016   BMS to RCA   Obesity hypoventilation syndrome (HCC)    Orthostatic hypotension    OSA on CPAP    8 cm H2O   OSTEOARTHRITIS    PAF (paroxysmal atrial fibrillation) (HCC)    Paroxysmal atrial fibrillation ([place]) 2003    (? chronic?) Off anticoag for a while due to falls.  Then apixaban started 12/2014.   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 Ella Bodo): neuropathic pain->diff to treat, failed nucynta, failed spinal  stimul trial, oxycontin hs + tramadol + gabap as of 12/2017 f/u Dr. Letta Pate.   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   Sick sinus syndrome (HCC)    PPM placed   Thrombocytopenia ([place]) 2018   HSM on 2007 abd u/s---suspect some mild splenic sequestration chronically.   Unspecified glaucoma(365.9)    Unspecified hereditary and idiopathic peripheral neuropathy approx age 19   bilat LE's, ? left arm, too.  Feet became progressively numb + left foot pain intermittently.  Pt may be trying a spinal stimulator (as of 05/2015)   VENOUS INSUFFICIENCY    Being followed by Dr. Sharol Given as of 10/2016 for two R LL venous stasis ulcers/skin tears.  Healed as of 10/30/16 f/u with Dr. Sharol Given.   VENTRAL HERNIA    Wound dehiscence 10/22/2019    Family History  Problem Relation Age of Onset   Hypertension Mother    Coronary artery disease Mother    Heart attack Mother    Neuropathy Mother    Pulmonary fibrosis Father        asbestosis    Past Surgical History:  Procedure Laterality Date   AMPUTATION Left 04/11/2013   Procedure: AMPUTATION DIGIT Left 3rd toe;  Surgeon: [name], MD;  Location: [facility];  Service: Orthopedics;  Laterality: Left;  Left 3rd toe amputation at [facility]   AMPUTATION Left 08/29/2019   Procedure: LEFT TRANSMETATARSAL AMPUTATION;   Surgeon: [name], MD;  Location: [facility] [place];  Service: Orthopedics;  Laterality: Left;   BIOPSY  05/04/2020   Procedure: BIOPSY;  Surgeon: Jackquline Denmark, MD;  Location: [facility] ENDOSCOPY;  Service: Endoscopy;;   [place]; 03/10/16   1997 Non-obstructive disease.  03/2016 BMS to RCA, with 25% pDiag dz, o/w normal cors per cath 03/07/16.  Cath 11/01/16: in stent restenosis, successful baloon angioplasty. 08/2020 branch vessel dz, cont medical therapy rec'd.   CARDIAC CATHETERIZATION  12/24/2012   mild < 20% LCx, prox 30% RCA; LVEF 55-65% , moderate pulmonary HTN, moderate AS   CARDIAC DEFIBRILLATOR PLACEMENT  11/07/2017   [facility] MRI Quad CRT defibrillator   CARDIOVASCULAR STRESS TEST  05/11/16 ([facility])   2017 Myocardial perfusion imaging:  No ischemia; scar in apex, global hypokinesis, EF 36%.  06/15/20->mod primarily fixed inferolat wall defect mildly worse with stress c/w infarct/scar with mild peri-infarct ischemia, normal EF-->for cath per cards.   Carotid dopplers  03/09/2016   [facility]: no hemodynamically significant stenosis on either side.   CHOLECYSTECTOMY     COLONOSCOPY [name] 10/30/2012   Procedure: COLONOSCOPY;  Surgeon: [name], MD;  Location: [facility] ENDOSCOPY;  Service: Endoscopy;  Laterality: [name];   CORONARY ANGIOPLASTY WITH STENT PLACEMENT  03/2016; 04/2017   2017-[facility]: BMS to RCA-pt was placed on Brilinta.  04/2017: DES to RCA.   DORSAL SLIT [name] 10/29/2019   for severe phimosis. Procedure: DORSAL SLIT;  Surgeon: Alexis Frock, MD;  Location: [facility] ORS;  Service: Urology;  Laterality: [name];  [place]  05/04/2020   Procedure: ESOPHAGEAL DILATION;  Surgeon: Jackquline Denmark, MD;  Location: [facility] ENDOSCOPY;  Service: Endoscopy;;   ESOPHAGOGASTRODUODENOSCOPY (EGD) WITH PROPOFOL [name] 05/04/2020   Procedure: ESOPHAGOGASTRODUODENOSCOPY (EGD) WITH PROPOFOL;  Surgeon: Jackquline Denmark, MD;  Location: [facility] ENDOSCOPY;  Service: Endoscopy;  Laterality: [name];   EYE SURGERY  Bilateral cataract   HEMORRHOID SURGERY     INTRAOCULAR LENS INSERTION Bilateral    KNEE SURGERY Right    LEFT  AND RIGHT HEART CATHETERIZATION WITH CORONARY ANGIOGRAM [name] 12/24/2012   Procedure: LEFT AND RIGHT HEART CATHETERIZATION WITH CORONARY ANGIOGRAM;  Surgeon: [name], MD;  Location: [facility] CATH LAB;  Service: Cardiovascular;  Laterality: [name];   LEG SURGERY Bilateral    lenghtening    PACEMAKER PLACEMENT  04/13/2016   2nd deg HB after TAVR, pt had DC MDT PPM placed.   SHOULDER ARTHROSCOPY  08/30/2011   Procedure: ARTHROSCOPY SHOULDER;  Surgeon: [name], MD;  Location: [place];  Service: Orthopedics;  Laterality: Right;  Right Shoulder Arthroscopy, Debridement, and Decompression   SPINAL CORD STIMULATOR INSERTION [name] 09/10/2015   Procedure: LUMBAR SPINAL CORD STIMULATOR INSERTION;  Surgeon: [name], MD;  Location: [place] NEURO ORS;  Service: Neurosurgery;  Laterality: [name];   TEE WITHOUT CARDIOVERSION [name] 09/16/2019   Procedure: TRANSESOPHAGEAL ECHOCARDIOGRAM (TEE);  Surgeon: Skeet Latch, MD;  Location: [facility] ENDOSCOPY;  Service: Cardiovascular;  Laterality: [name];   TEE WITHOUT CARDIOVERSION [name] 12/02/2019   +vegetation on AVR and pacer lead in RV.  EF 55-60%, normal wall motion.  Valves function normal.  Procedure: TRANSESOPHAGEAL ECHOCARDIOGRAM (TEE);  Surgeon: Geralynn Rile, MD;  Location: [place];  Service: Cardiovascular;  Laterality: [name];   TOE AMPUTATION Left    due to osteomyelitis.  R big toe surg due to osteoarth   TONSILLECTOMY     traeculectomy Left    eye   TRANSCATHETER AORTIC VALVE REPLACEMENT, TRANSFEMORAL  04/11/2016   TRANSESOPHAGEAL ECHOCARDIOGRAM  03/09/2016; 09/2019   [facility]: EF 55-60%, PFO seen with bi-directional shunting, no thrombus in appendage.  09/2019 ->no valvular vegetations. Small patent foramen ovale with predominantly left to right shunting across the interatrial septum.   TRANSTHORACIC ECHOCARDIOGRAM  01/2015; 01/2016; 05/18/16; 09/18/16,  05/2017, 08/2017   01/2015 No signif change in aortic stenosis (moderate).  01/2016 Severe LVH w/small LV cavity, EF 60-65%, grade I diast dysfxn.  05/2016 (s/p TAVR): EF 50-55%, grd I DD, biopros AV good.  08/2016--EF 50-55%, LV septal motion c/w conduction abnl, grd I DD,mild MS,bioprosth aortic valve well seated, w/trace AR. 05/2017 TTE EF 35%. 08/2017-EF 35%, mod diff hypokin LV, grd I DD, biopros AV good.    TRANSTHORACIC ECHOCARDIOGRAM  04/2018; 09/2019   04/2018: EF 40-45%, mod diffuse LV hypokinesis, grd I DD, bioprosth AV well seated, no AS or AR. 09/2019 EF 60-65%, grd I DD, valves fine, including bioprosth AV. 04/29/20 (tech diff) EF 55-60%, grd I DD, vegetation on MV.   VITRECTOMY     Social History   Occupational History   Occupation: Engineer, production    Comment: retired  Tobacco Use   Smoking status: Never   Smokeless tobacco: Never  Vaping Use   Vaping Use: Never used  Substance and Sexual Activity   Alcohol use: No    Alcohol/week: 0.0 standard drinks   Drug use: No    Types: Oxycodone   Sexual activity: Not Currently

## 2021-01-07 ENCOUNTER — Other Ambulatory Visit: Payer: Self-pay | Admitting: Family Medicine

## 2021-01-19 ENCOUNTER — Encounter: Payer: Self-pay | Admitting: Physician Assistant

## 2021-01-19 ENCOUNTER — Ambulatory Visit (INDEPENDENT_AMBULATORY_CARE_PROVIDER_SITE_OTHER): Payer: Medicare Other | Admitting: Family

## 2021-01-19 DIAGNOSIS — L97521 Non-pressure chronic ulcer of other part of left foot limited to breakdown of skin: Secondary | ICD-10-CM | POA: Diagnosis not present

## 2021-01-19 NOTE — Progress Notes (Signed)
Office Visit Note   Patient: Tim [name]           Date of Birth: [birth year]           MRN: [id number] Visit Date: 01/19/2021              Requested by: Tammi Sou, MD [address] [place],  [place] [address] PCP: Tammi Sou, MD  Chief Complaint  Patient presents with   Left Foot - Follow-up       HPI: The patient is an 80 year old gentleman who presents today in routine follow-up for Wagner grade 1 ulcer to his left transmetatarsal amputation.  He has been doing Iodosorb dressings now for quite some time.  He stopped using his CircAid wrap and has noticed this has actually improved the ulcer and edema to his foot.  Really omes in for routine trimming of his nonviable callus tissue.    Assessment & Plan: Visit Diagnoses:  1. Non-pressure chronic ulcer of other part of left foot limited to breakdown of skin ([place])      Plan: He states he stopped using his CircAid wraps but is wearing the compression stocking he will continue this continue with Iodosorb dressing changes.  Follow-Up Instructions: Return in about 3 weeks (around 02/09/2021).   Ortho Exam  [name], oriented, no adenopathy, well-dressed, normal affect, normal respiratory effort.  Overall improved appearance of ulcer.  examiNation of the left lower extremity he does have trace edema to his left lower extremity up to the tibial tubercle with hemosiderin staining.  Some dry flaking skin.  Beneath his transmetatarsal amputation has a Wagner grade 1 ulcer this is stable at 3 cm in length today is only open about 3 mm in width there is some hypergranulation tissue about quarter sized this was touched with silver nitrate.  The entire area was trimmed of nonviable tissue back to viable tissue. There is no redness no cellulitis no signs of infection.  The ulcer does not probe to bone or tendon there is no tunneling.  Imaging: No results found. No images are attached to the  encounter.  Labs: Lab Results  Component Value Date   HGBA1C 5.5 11/01/2020   HGBA1C 5.5 11/01/2020   HGBA1C 5.5 (A) 11/01/2020   HGBA1C 5.5 11/01/2020   ESRSEDRATE 34 (H) 11/24/2020   ESRSEDRATE 38 (H) 07/14/2020   ESRSEDRATE 20 02/09/2016   CRP 1.3 11/24/2020   CRP 0.9 07/14/2020   CRP 0.7 05/03/2020   LABURIC 3.5 (L) 09/10/2013   REPTSTATUS 05/04/2020 FINAL 04/28/2020   CULT  04/28/2020    NO GROWTH 5 DAYS Performed at [facility], [place] [address] [place], [place] [address]    LABORGA ENTEROCOCCUS FAECALIS 09/11/2019     Lab Results  Component Value Date   ALBUMIN 3.0 (L) 05/07/2020   ALBUMIN 2.9 (L) 05/06/2020   ALBUMIN 3.0 (L) 05/05/2020    Lab Results  Component Value Date   MG 1.7 05/07/2020   MG 1.6 (L) 05/06/2020   MG 1.8 05/05/2020   No results found for: VD25OH  No results found for: PREALBUMIN CBC EXTENDED Latest Ref Rng & Units 07/14/2020 05/07/2020 05/06/2020  WBC 3.8 - 10.8 Thousand/uL 7.2 5.5 5.6  RBC 4.20 - 5.80 Million/uL 4.11(L) 3.54(L) 3.41(L)  HGB 13.2 - 17.1 g/dL 12.2(L) 10.2(L) 10.1(L)  HCT 38.5 - 50.0 % 36.7(L) 32.1(L) 31.0(L)  PLT 140 - 400 Thousand/uL 121(L) 133(L) 129(L)  NEUTROABS 1,500 - 7,800  cells/uL 4,694 3.0 3.2  LYMPHSABS 850 - 3,900 cells/uL 1,390 1.6 1.6     There is no height or weight on file to calculate BMI.  Orders:  No orders of the defined types were placed in this encounter.  No orders of the defined types were placed in this encounter.    Procedures: No procedures performed  Clinical Data: No additional findings.  ROS:  All other systems negative, except as noted in the HPI. Review of Systems  Constitutional:  Negative for chills and fever.  Cardiovascular:  Positive for leg swelling.  Skin:  Positive for wound. Negative for color change.   Objective: Vital Signs: There were no vitals taken for this visit.  Specialty Comments:  No specialty comments available.  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   ICD (implantable cardioverter-defibrillator) infection ([place]) 12/22/2019   Gram-positive bacteremia 11/27/2019   FUO (fever of unknown origin) 11/26/2019   Low blood pressure reading 11/05/2019   Wound dehiscence 10/22/2019   PFO (patent foramen ovale) 10/06/2019   ICD (implantable cardioverter-defibrillator) battery depletion    Pressure injury of skin 09/13/2019   Enterococcal bacteremia history  09/13/2019   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   Urinary retention 09/13/2019   Phimosis 09/13/2019   SIRS (systemic inflammatory response syndrome) ([place]) 09/12/2019   Sepsis ([place]) 09/12/2019   Enterococcus UTI 09/12/2019   Cellulitis of foot    History of transmetatarsal amputation of left foot ([place])    Subacute osteomyelitis, left ankle and foot ([place])    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 ([place]) 04/17/2018   Diabetic neuropathy, painful ([place]) 12/26/2016   Neuropathic pain of foot, left 06/20/2016   Insulin resistance 07/22/2015   Status post amputation of left great toe ([place]) 06/08/2015   Spinal stenosis of lumbar region 02/16/2015   Hereditary and idiopathic peripheral neuropathy 01/12/2015   Oral thrush 12/28/2014   Orthostatic dizziness 05/24/2014   Paresthesias with subjective weakness 01/30/2014   Bilateral thigh pain 01/30/2014   Abdominal wall pain in right upper quadrant 01/22/2014   Gross hematuria 09/25/2013   Intertrigo 09/25/2013   IBS (irritable bowel  syndrome) 09/11/2013   Chronic pain syndrome 08/06/2013   CAD (coronary artery disease), native coronary artery 12/25/2012   Paroxysmal atrial fibrillation ([place]) 12/25/2012   Moderate aortic stenosis 12/25/2012   Aortic stenosis 12/24/2012   Personal history of colonic adenoma 10/30/2012   Diverticulosis of colon (without mention of hemorrhage) 10/30/2012   Internal hemorrhoids 10/30/2012   Change in bowel habits intermittent loose stools 10/30/2012   Routine health maintenance 06/09/2012   Hereditary peripheral neuropathy 10/10/2011   Long term current use of anticoagulant - apixiban 08/16/2010   Dyspnea on exertion 09/08/2009   HYPERCHOLESTEROLEMIA-PURE 01/13/2009   Essential hypertension, benign 01/13/2009   EDEMA 01/13/2009   GOUT 01/12/2009   GLAUCOMA 01/12/2009   Venous insufficiency (chronic) (peripheral) 01/12/2009   COPD (chronic obstructive pulmonary disease) ([place]) 01/12/2009   VENTRAL HERNIA 01/12/2009   OSTEOARTHRITIS 01/12/2009   ARTHRITIS 01/12/2009   DEGENERATIVE DISC  DISEASE 01/12/2009   CATARACT, HX OF 01/12/2009   HEART MURMUR, HX OF 01/12/2009   BENIGN PROSTATIC HYPERTROPHY, HX OF 01/12/2009   Obesity 01/17/2007   OBSTRUCTIVE SLEEP APNEA 01/17/2007   ASTHMA 01/17/2007   GERD 01/17/2007   HALLUX VALGUS, ACQUIRED 01/17/2007   Pulmonary hypertension ([place]) 01/17/2007   Past Medical History:  Diagnosis Date   AICD (automatic cardioverter/defibrillator) present    Balanitis    +severe phimosis+ buried penis->circ not possible but dorsal slit done 10/2019   BPH (benign prostatic hypertrophy)    with urinary retention.  Renal u/s 12/04/19 NORMAL KIDNEYS, NORMAL BLADDER, NO HYDRONEPHROSIS   CAD (coronary artery disease)    Nonobstructive by 12/2012 cath; then 03/2016 he required BMS to RCA ([facility]).  In-stent restenosis on cath 11/01/16, baloon angioplasty successful.   CATARACT, HX OF    Chronic combined systolic and diastolic CHF (congestive heart failure) ([place])  05/2016   Ischemic CM.  04/2018 EF 40-45%, grd I DD.   Chronic pain syndrome    Lumbar DDD; chronic neuropathic pain (DM); has spinal stimulator and sees pain mgmt MD   Complicated UTI (urinary tract infection) 09/2019   Phimoses, acute urinary retention, entoerococcus UTI, enterococc bacteremia.  F/u blood clx's neg x 5d.     COPD    Diabetes mellitus type 2 with complications ([place])    TJQ3E jumped from 5.7% to 6.1% 03/2015---started metformin at that time.  DM 2 dx by fasting gluc criteria 2018.  Has chronic neuropath pain   Enterococcal bacteremia 09/2019   Phimoses, acute urinary retention, entoerococcus UTI, enterococc bacteremia.  F/u blood clx's neg x 5d.     Essential hypertension, benign    Fatty liver 2007   2007 u/s showed fatty liver with hepatosplenomegaly.  2019 repeat u/s->fatty liver but no cirrhosis or hepatosplenomegaly.   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 Tommy Medal)   Lumbosacral neuritis    Lumbosacral spondylosis    Lumbar spinal stenosis with neurogenic claudication--contributes to his chronic pain syndrome   Normal memory function 08/2014   Neuropsychological testing ([facility]): no cognitive impairment or sign of neurodegenerative disorder.  Likely has adjustment d/o with mixed anxiety/depressed features and may benefit from low dose SNRI.     Normocytic anemia 03/2016   Mild-pt needs ferritin and vit B12 level checked (as of 03/22/16). Hb stable 09/2019   NSTEMI (non-ST elevated myocardial infarction) ([place]) 03/20/2016   BMS to RCA   Obesity hypoventilation syndrome (HCC)    Orthostatic hypotension    OSA on  CPAP    8 cm H2O   OSTEOARTHRITIS    PAF (paroxysmal atrial fibrillation) (HCC)    Paroxysmal atrial fibrillation ([place]) 2003    (? chronic?) Off anticoag for a while due to falls.  Then apixaban started 12/2014.   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 Ella Bodo): neuropathic pain->diff to treat, failed nucynta, failed spinal stimul trial, oxycontin hs + tramadol + gabap as of 12/2017 f/u Dr. Letta Pate.   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   Sick sinus syndrome (HCC)    PPM placed   Thrombocytopenia ([place]) 2018   HSM on 2007 abd u/s---suspect some mild splenic sequestration chronically.   Unspecified glaucoma(365.9)    Unspecified hereditary and idiopathic peripheral neuropathy approx age 63   bilat LE's, ? left arm, too.  Feet became progressively numb + left foot pain intermittently.  Pt may be trying a spinal stimulator (as of 05/2015)   VENOUS INSUFFICIENCY    Being followed by Dr. Sharol Given as of 10/2016 for two R LL venous stasis ulcers/skin tears.  Healed as of 10/30/16 f/u with Dr. Sharol Given.   VENTRAL HERNIA    Wound dehiscence 10/22/2019    Family History  Problem Relation Age of Onset   Hypertension Mother    Coronary artery disease Mother    Heart attack Mother    Neuropathy Mother     Pulmonary fibrosis Father        asbestosis    Past Surgical History:  Procedure Laterality Date   AMPUTATION Left 04/11/2013   Procedure: AMPUTATION DIGIT Left 3rd toe;  Surgeon: [name], MD;  Location: [facility];  Service: Orthopedics;  Laterality: Left;  Left 3rd toe amputation at [facility]   AMPUTATION Left 08/29/2019   Procedure: LEFT TRANSMETATARSAL AMPUTATION;  Surgeon: [name], MD;  Location: [facility];  Service: Orthopedics;  Laterality: Left;   BIOPSY  05/04/2020   Procedure: BIOPSY;  Surgeon: Jackquline Denmark, MD;  Location[facility] ENDOSCOPY;  Service: Endoscopy;;   [place]; 03/10/16   1997 Non-obstructive disease.  03/2016 BMS to RCA, with 25% pDiag dz, o/w normal cors per cath 03/07/16.  Cath 11/01/16: in stent restenosis, successful baloon angioplasty. 08/2020 branch vessel dz, cont medical therapy rec'd.   CARDIAC CATHETERIZATION  12/24/2012   mild < 20% LCx, prox 30% RCA; LVEF 55-65% , moderate pulmonary HTN, moderate AS   CARDIAC DEFIBRILLATOR PLACEMENT  11/07/2017   [facility] MRI Quad CRT defibrillator   CARDIOVASCULAR STRESS TEST  05/11/16 ([facility])   2017 Myocardial perfusion imaging:  No ischemia; scar in apex, global hypokinesis, EF 36%.  06/15/20->mod primarily fixed inferolat wall defect mildly worse with stress c/w infarct/scar with mild peri-infarct ischemia, normal EF-->for cath per cards.   Carotid dopplers  03/09/2016   [facility]: no hemodynamically significant stenosis on either side.   CHOLECYSTECTOMY     COLONOSCOPY [name] 10/30/2012   Procedure: COLONOSCOPY;  Surgeon: [name], MD;  Location: [facility] ENDOSCOPY;  Service: Endoscopy;  Laterality: [name];   CORONARY ANGIOPLASTY WITH STENT PLACEMENT  03/2016; 04/2017   2017-[facility]: BMS to RCA-pt was placed on Brilinta.  04/2017: DES to RCA.   DORSAL SLIT [name] 10/29/2019   for severe phimosis. Procedure: DORSAL SLIT;  Surgeon: Alexis Frock, MD;  Location: [facility] ORS;  Service: Urology;  Laterality: [name];  [place]  05/04/2020  Procedure: ESOPHAGEAL DILATION;  Surgeon: Jackquline Denmark, MD;  Location: [facility] ENDOSCOPY;  Service: Endoscopy;;   ESOPHAGOGASTRODUODENOSCOPY (EGD) WITH PROPOFOL [name] 05/04/2020   Procedure: ESOPHAGOGASTRODUODENOSCOPY (EGD) WITH PROPOFOL;  Surgeon: Jackquline Denmark, MD;  Location: [name] ENDOSCOPY;  Service: Endoscopy;  Laterality: [name];   EYE SURGERY Bilateral cataract   HEMORRHOID SURGERY     INTRAOCULAR LENS INSERTION Bilateral    KNEE SURGERY Right    LEFT AND RIGHT HEART CATHETERIZATION WITH CORONARY ANGIOGRAM [name] 12/24/2012   Procedure: LEFT AND RIGHT HEART CATHETERIZATION WITH CORONARY ANGIOGRAM;  Surgeon: [name], MD;  Location: [facility] CATH LAB;  Service: Cardiovascular;  Laterality: [name];   LEG SURGERY Bilateral    lenghtening    PACEMAKER PLACEMENT  04/13/2016   2nd deg HB after TAVR, pt had DC MDT PPM placed.   SHOULDER ARTHROSCOPY  08/30/2011   Procedure: ARTHROSCOPY SHOULDER;  Surgeon: [name], MD;  Location: [facility];  Service: Orthopedics;  Laterality: Right;  Right Shoulder Arthroscopy, Debridement, and Decompression   SPINAL CORD STIMULATOR INSERTION [name] 09/10/2015   Procedure: LUMBAR SPINAL CORD STIMULATOR INSERTION;  Surgeon: [name], MD;  Location: [facility] NEURO ORS;  Service: Neurosurgery;  Laterality: [name];   TEE WITHOUT CARDIOVERSION [name] 09/16/2019   Procedure: TRANSESOPHAGEAL ECHOCARDIOGRAM (TEE);  Surgeon: Skeet Latch, MD;  Location: [facility] ENDOSCOPY;  Service: Cardiovascular;  Laterality: [name];   TEE WITHOUT CARDIOVERSION [name] 12/02/2019   +vegetation on AVR and pacer lead in RV.  EF 55-60%, normal wall motion.  Valves function normal.  Procedure: TRANSESOPHAGEAL ECHOCARDIOGRAM (TEE);  Surgeon: Geralynn Rile, MD;  Location: [facility];  Service: Cardiovascular;  Laterality: [name];   TOE AMPUTATION Left    due to osteomyelitis.  R big toe surg due to osteoarth   TONSILLECTOMY     traeculectomy Left    eye   TRANSCATHETER AORTIC VALVE REPLACEMENT,  TRANSFEMORAL  04/11/2016   TRANSESOPHAGEAL ECHOCARDIOGRAM  03/09/2016; 09/2019   [facility]: EF 55-60%, PFO seen with bi-directional shunting, no thrombus in appendage.  09/2019 ->no valvular vegetations. Small patent foramen ovale with predominantly left to right shunting across the interatrial septum.   TRANSTHORACIC ECHOCARDIOGRAM  01/2015; 01/2016; 05/18/16; 09/18/16, 05/2017, 08/2017   01/2015 No signif change in aortic stenosis (moderate).  01/2016 Severe LVH w/small LV cavity, EF 60-65%, grade I diast dysfxn.  05/2016 (s/p TAVR): EF 50-55%, grd I DD, biopros AV good.  08/2016--EF 50-55%, LV septal motion c/w conduction abnl, grd I DD,mild MS,bioprosth aortic valve well seated, w/trace AR. 05/2017 TTE EF 35%. 08/2017-EF 35%, mod diff hypokin LV, grd I DD, biopros AV good.    TRANSTHORACIC ECHOCARDIOGRAM  04/2018; 09/2019   04/2018: EF 40-45%, mod diffuse LV hypokinesis, grd I DD, bioprosth AV well seated, no AS or AR. 09/2019 EF 60-65%, grd I DD, valves fine, including bioprosth AV. 04/29/20 (tech diff) EF 55-60%, grd I DD, vegetation on MV.   VITRECTOMY     Social History   Occupational History   Occupation: Engineer, production    Comment: retired  Tobacco Use   Smoking status: Never   Smokeless tobacco: Never  Vaping Use   Vaping Use: Never used  Substance and Sexual Activity   Alcohol use: No    Alcohol/week: 0.0 standard drinks   Drug use: No    Types: Oxycodone   Sexual activity: Not Currently

## 2021-02-04 ENCOUNTER — Other Ambulatory Visit: Payer: Self-pay | Admitting: Family Medicine

## 2021-02-09 ENCOUNTER — Other Ambulatory Visit: Payer: Self-pay

## 2021-02-09 ENCOUNTER — Other Ambulatory Visit: Payer: Self-pay | Admitting: Family Medicine

## 2021-02-09 ENCOUNTER — Encounter: Payer: Self-pay | Admitting: Family Medicine

## 2021-02-09 ENCOUNTER — Ambulatory Visit: Payer: Medicare Other | Admitting: Family Medicine

## 2021-02-09 VITALS — BP 97/61 | HR 75 | Temp 98.0°F | Resp 16 | Ht 72.0 in | Wt 335.0 lb

## 2021-02-09 DIAGNOSIS — I5042 Chronic combined systolic (congestive) and diastolic (congestive) heart failure: Secondary | ICD-10-CM | POA: Diagnosis not present

## 2021-02-09 DIAGNOSIS — I480 Paroxysmal atrial fibrillation: Secondary | ICD-10-CM

## 2021-02-09 DIAGNOSIS — Z8739 Personal history of other diseases of the musculoskeletal system and connective tissue: Secondary | ICD-10-CM

## 2021-02-09 DIAGNOSIS — E1142 Type 2 diabetes mellitus with diabetic polyneuropathy: Secondary | ICD-10-CM

## 2021-02-09 DIAGNOSIS — R5381 Other malaise: Secondary | ICD-10-CM

## 2021-02-09 MED ORDER — ZOSTER VAC RECOMB ADJUVANTED 50 MCG/0.5ML IM SUSR: 0.5000 mL | Freq: Once | INTRAMUSCULAR | 0 refills | Status: AC

## 2021-02-09 NOTE — Progress Notes (Signed)
OFFICE VISIT  02/09/2021  CC:  Chief Complaint  Patient presents with   Follow-up    RCI, pt is fasting    HPI:    Patient is a 80 y.o. Caucasian male who presents for 3 mo f/u DM, CHF, PAF on chronic anticoagulation, hx of osteomyelitis and endocarditis. A/P as of last visit: "1) DM 2, diet controlled. A1c 5.5% today.   2) HTN: normal to low-normal bp at home, low here today but suspect some increased margin of error with the technique we have to use to measure his bp in office. No symptoms of low bp. Cont toprol xl 64m qd and entresto 24-26, 1 bid. Lytes/cr stable/normal 3 mo ago at cardiologist's office--reviewed today. He has orders from his cardiologist to get rpt routine labs at [facility] at his earliest convenience.   3) CAD: asymptomatic---he has hx of atypical CP suspected to be GI etiology. Most recent Cath 08/2020 showed branch vessel dz, no intervention recommended-->cont medical therapy with ASA [id number], BB, statin, zetia.   4) Hx of CHF: appears euvolemic. Followed by cardiology, has bumex to use prn but does not typically need this medication.   5) PAF: chronic anticoag with eliquis 14m40md.  Asymptomatic. Hb normal 08/2020 with cardiology.   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 Cont amoxil 1g bid suppressive therapy. Since his foot is stable they have stopped talking about possible L BKA.   7) Debilitated pt: his multiple cardiopulmonary comorbidities + neuropathy and orthopedic complications+ morbid obesity have limited his mobility to using 2 canes to ambulate.   Pt to have labs done via cardiologist's orders ([facility]) soon. No blood draw here today."  INTERIM HX: Doing ok overall. No fevers.  No abnormal bleeding noted. Denies palpitations or racing heart.   DM: no home glucose monitoring. Eats low carb and low Na diet.   He cannot feel his feet any at all (mid tibia down) bilat.  BP: at home systolics  usually [id number] he has not had anything to eat or drink (last was 15 hours ago!).  Feeling a little more tired/weak than normal today but no dizziness or presyncope.   Usually he drinks about 1000-1200 ml fluid/day. He has not had to take any torsemide lately.  L foot ulcer stable per pt, wife does dressing changes qd. F/u with ID, Dr. VanTommy Medal25/22-->all stable, kept pt on amoxil 1g bid. Dr. DudSharol Givenllowing pt's L foot closely, no worry about osteomyelitis lately.  I reviewed [facility]'s labs [facility] from 11[facility]20 today: All normal cmet except glucose 127 and albumin 3.6.  Creatinine was 0.85 and K+ was 4.6, Na 141.   Lipid panel on 11/01/20: Tchol 115, trigs 54, HDL 62, LDL 41.  ROS as above, plus--> no fevers, no CP, no SOB at rest.  He has stable chronic DOE with just walking.  No wheezing, no cough, no dizziness, no HAs, no rashes, no melena/hematochezia.  No polyuria or polydipsia.  No myalgias or arthralgias.  No focal weakness, paresthesias, or tremors.  No acute vision or hearing abnormalities.  No dysuria or unusual/new urinary urgency or frequency.  No recent changes in lower legs. No n/v/d or abd pain.  No palpitations.    Past Medical History:  Diagnosis Date   AICD (automatic cardioverter/defibrillator) present    Balanitis    +severe phimosis+ buried penis->circ not possible but dorsal slit done 10/2019   BPH (benign prostatic  hypertrophy)    with urinary retention.  Renal u/s 12/04/19 NORMAL KIDNEYS, NORMAL BLADDER, NO HYDRONEPHROSIS   CAD (coronary artery disease)    Nonobstructive by 12/2012 cath; then 03/2016 he required BMS to RCA ([facility]).  In-stent restenosis on cath 11/01/16, baloon angioplasty successful.   CATARACT, HX OF    Chronic combined systolic and diastolic CHF (congestive heart failure) ([place]) 05/2016   Ischemic CM.  04/2018 EF 40-45%, grd I DD.   Chronic pain syndrome    Lumbar DDD; chronic neuropathic pain (DM); has spinal stimulator and sees pain  mgmt MD   Complicated UTI (urinary tract infection) 09/2019   Phimoses, acute urinary retention, entoerococcus UTI, enterococc bacteremia.  F/u blood clx's neg x 5d.     COPD    Diabetes mellitus type 2 with complications ([place])    [id number] jumped from 5.7% to 6.1% 03/2015---started metformin at that time.  DM 2 dx by fasting gluc criteria 2018.  Has chronic neuropath pain   Enterococcal bacteremia 09/2019   Phimoses, acute urinary retention, entoerococcus UTI, enterococc bacteremia.  F/u blood clx's neg x 5d.     Essential hypertension, benign    Fatty liver 2007   2007 u/s showed fatty liver with hepatosplenomegaly.  2019 repeat u/s->fatty liver but no cirrhosis or hepatosplenomegaly.   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 Tommy Medal)   Lumbosacral neuritis    Lumbosacral spondylosis    Lumbar spinal stenosis with neurogenic claudication--contributes to his chronic pain syndrome   Normal memory function 08/2014   Neuropsychological testing ([facility]): no cognitive impairment or sign of neurodegenerative disorder.  Likely has adjustment d/o with mixed anxiety/depressed features and may benefit from low dose SNRI.     Normocytic anemia 03/2016   Mild-pt needs ferritin and vit B12 level checked (as of 03/22/16). Hb stable 09/2019   NSTEMI (non-ST elevated myocardial infarction) ([place]) 03/20/2016   BMS to RCA   Obesity hypoventilation syndrome (HCC)    Orthostatic hypotension    OSA on CPAP    8 cm H2O   OSTEOARTHRITIS    PAF (paroxysmal atrial fibrillation) (HCC)    Paroxysmal atrial fibrillation ([place]) 2003    (? chronic?) Off anticoag for a  while due to falls.  Then apixaban started 12/2014.   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 Ella Bodo): neuropathic pain->diff to treat, failed nucynta, failed spinal stimul trial, oxycontin hs + tramadol + gabap as of 12/2017 f/u Dr. Letta Pate.   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   Sick sinus syndrome (HCC)    PPM placed   Thrombocytopenia ([place]) 2018   HSM on 2007 abd u/s---suspect some mild splenic sequestration chronically.   Unspecified glaucoma(365.9)    Unspecified hereditary and idiopathic peripheral neuropathy approx age 74   bilat LE's, ? left arm, too.  Feet became progressively numb + left foot pain intermittently.  Pt may be trying a spinal stimulator (as of 05/2015)   VENOUS INSUFFICIENCY    Being followed by Dr. Sharol Given as of 10/2016 for two R LL venous stasis ulcers/skin tears.  Healed as of 10/30/16 f/u with Dr. Sharol Given.   VENTRAL HERNIA    Wound dehiscence 10/22/2019    Past Surgical History:  Procedure Laterality Date   AMPUTATION Left 04/11/2013   Procedure: AMPUTATION DIGIT Left 3rd toe;  Surgeon: [name], MD;  Location: [place];  Service: Orthopedics;  Laterality: Left;  Left 3rd toe amputation at [facility]   AMPUTATION Left 08/29/2019   Procedure: LEFT TRANSMETATARSAL  AMPUTATION;  Surgeon: [name], MD;  Location: [place];  Service: Orthopedics;  Laterality: Left;   BIOPSY  05/04/2020   Procedure: BIOPSY;  Surgeon: Jackquline Denmark, MD;  Location: [facility] ENDOSCOPY;  Service: Endoscopy;;   [place]; 03/10/16   1997 Non-obstructive disease.  03/2016 BMS to RCA, with 25% pDiag dz, o/w normal cors per cath 03/07/16.  Cath 11/01/16: in stent restenosis, successful baloon angioplasty. 08/2020 branch vessel dz, cont medical therapy rec'd.   CARDIAC CATHETERIZATION  12/24/2012   mild < 20% LCx, prox 30% RCA; LVEF 55-65% , moderate pulmonary HTN, moderate AS   CARDIAC DEFIBRILLATOR PLACEMENT  11/07/2017   [place] MRI Quad CRT defibrillator   CARDIOVASCULAR STRESS TEST  05/11/16 ([facility])   2017 Myocardial perfusion imaging:  No ischemia; scar in apex, global hypokinesis, EF 36%.  06/15/20->mod primarily fixed inferolat wall defect mildly worse with stress c/w infarct/scar with mild peri-infarct ischemia, normal EF-->for cath per cards.   Carotid dopplers  03/09/2016   [facility]: no hemodynamically significant stenosis on either side.   CHOLECYSTECTOMY     COLONOSCOPY [name] 10/30/2012   Procedure: COLONOSCOPY;  Surgeon: [name], MD;  Location: [facility] ENDOSCOPY;  Service: Endoscopy;  Laterality: [name];   CORONARY ANGIOPLASTY WITH STENT PLACEMENT  03/2016; 04/2017   2017-[facility]: BMS to RCA-pt was placed on Brilinta.  04/2017: DES to RCA.   DORSAL SLIT [name] 10/29/2019   for severe phimosis. Procedure: DORSAL SLIT;  Surgeon: Alexis Frock, MD;  Location: [facility] ORS;  Service: Urology;  Laterality: [name];  [place]  05/04/2020   Procedure: ESOPHAGEAL DILATION;  Surgeon: Jackquline Denmark, MD;  Location: [facility] ENDOSCOPY;  Service: Endoscopy;;   ESOPHAGOGASTRODUODENOSCOPY (EGD) WITH PROPOFOL [name] 05/04/2020   Procedure: ESOPHAGOGASTRODUODENOSCOPY (EGD) WITH PROPOFOL;  Surgeon: Jackquline Denmark, MD;  Location: [facility] ENDOSCOPY;  Service: Endoscopy;  Laterality: [name];   EYE  SURGERY Bilateral cataract   HEMORRHOID SURGERY     INTRAOCULAR LENS INSERTION Bilateral    KNEE SURGERY Right    LEFT AND RIGHT HEART CATHETERIZATION WITH CORONARY ANGIOGRAM [name] 12/24/2012   Procedure: LEFT AND RIGHT HEART CATHETERIZATION WITH CORONARY ANGIOGRAM;  Surgeon: [name], MD;  [facility] LAB;  Service: Cardiovascular;  Laterality: [name];   LEG SURGERY Bilateral    lenghtening    PACEMAKER PLACEMENT  04/13/2016   2nd deg HB after TAVR, pt had  DC MDT PPM placed.   SHOULDER ARTHROSCOPY  08/30/2011   Procedure: ARTHROSCOPY SHOULDER;  Surgeon: [name], MD;  Location: [place];  Service: Orthopedics;  Laterality: Right;  Right Shoulder Arthroscopy, Debridement, and Decompression   SPINAL CORD STIMULATOR INSERTION [name] 09/10/2015   Procedure: LUMBAR SPINAL CORD STIMULATOR INSERTION;  Surgeon: [name], MD;  Location: [place] NEURO ORS;  Service: Neurosurgery;  Laterality: [name];   TEE WITHOUT CARDIOVERSION [name] 09/16/2019   Procedure: TRANSESOPHAGEAL ECHOCARDIOGRAM (TEE);  Surgeon: Skeet Latch, MD;  Location: [name] ENDOSCOPY;  Service: Cardiovascular;  Laterality: [name];   TEE WITHOUT CARDIOVERSION [name] 12/02/2019   +vegetation on AVR and pacer lead in RV.  EF 55-60%, normal wall motion.  Valves function normal.  Procedure: TRANSESOPHAGEAL ECHOCARDIOGRAM (TEE);  Surgeon: Geralynn Rile, MD;  Location: [place];  Service: Cardiovascular;  Laterality: [name];   TOE AMPUTATION Left    due to osteomyelitis.  R big toe surg due to osteoarth   TONSILLECTOMY     traeculectomy Left    eye   TRANSCATHETER AORTIC VALVE REPLACEMENT, TRANSFEMORAL  04/11/2016   TRANSESOPHAGEAL ECHOCARDIOGRAM  03/09/2016; 09/2019   [facility]: EF 55-60%, PFO seen with bi-directional shunting, no thrombus in appendage.  09/2019 ->no valvular vegetations. Small patent foramen ovale with predominantly left to right shunting across the interatrial septum.   TRANSTHORACIC ECHOCARDIOGRAM  01/2015; 01/2016; 05/18/16;  09/18/16, 05/2017, 08/2017   01/2015 No signif change in aortic stenosis (moderate).  01/2016 Severe LVH w/small LV cavity, EF 60-65%, grade I diast dysfxn.  05/2016 (s/p TAVR): EF 50-55%, grd I DD, biopros AV good.  08/2016--EF 50-55%, LV septal motion c/w conduction abnl, grd I DD,mild MS,bioprosth aortic valve well seated, w/trace AR. 05/2017 TTE EF 35%. 08/2017-EF 35%, mod diff hypokin LV, grd I DD, biopros AV good.    TRANSTHORACIC ECHOCARDIOGRAM  04/2018; 09/2019   04/2018: EF 40-45%, mod diffuse LV hypokinesis, grd I DD, bioprosth AV well seated, no AS or AR. 09/2019 EF 60-65%, grd I DD, valves fine, including bioprosth AV. 04/29/20 (tech diff) EF 55-60%, grd I DD, vegetation on MV.   VITRECTOMY      Outpatient Medications Prior to Visit  Medication Sig Dispense Refill   allopurinol (ZYLOPRIM) 300 MG tablet TAKE 1 TABLET BY MOUTH EVERY DAY WITH FOOD 90 tablet 0   amoxicillin (AMOXIL) 500 MG tablet Take 2 tablets (1,000 mg total) by mouth 2 (two) times daily. 120 tablet 11   apixaban (ELIQUIS) 5 MG TABS tablet Take 5 mg by mouth 2 (two) times daily.     ASPIRIN LOW DOSE 81 MG EC tablet Take 81 mg by mouth daily.     B Complex-C (B-COMPLEX WITH VITAMIN C) tablet Take 1 tablet by mouth daily. Take 1 tablet daily, 258m     cadexomer iodine (IODOSORB) 0.9 % gel Apply 1 application topically daily as needed for wound care. Apply to the affected area daily plus dry dressing 40 g 3   ezetimibe (ZETIA) 10 MG tablet Take 1 tablet (10 mg total) by mouth daily. 30 tablet 1   finasteride (PROSCAR) 5 MG tablet TAKE 1 TABLET BY MOUTH EVERY DAY 90 tablet 0   fluticasone (FLONASE) 50 MCG/ACT nasal spray Place 2 sprays into both nostrils as needed for allergies or rhinitis.     gabapentin (NEURONTIN) 800 MG tablet TAKE 1 TABLET (800 MG TOTAL) BY MOUTH 4 (FOUR) TIMES DAILY. 360 tablet 2   ipratropium (ATROVENT) 0.03 % nasal spray 2 SPRAY EACH NOSTRIL EVERY 12  HOURS FOR RUNNY NOSE 30 mL 2   KLOR-CON M20 20 MEQ  tablet TAKE 1 TABLET BY MOUTH EVERY DAY 30 tablet 2   metoprolol succinate (TOPROL-XL) 25 MG 24 hr tablet Take 25 mg by mouth daily.     Multiple Vitamin (MULTIVITAMIN ADULT PO) Take by mouth daily.     niacin (NIASPAN) 1000 MG CR tablet TAKE 1 TABLET BY MOUTH TWICE A DAY 180 tablet 0   pantoprazole (PROTONIX) 40 MG tablet TAKE 1 TABLET BY MOUTH EVERY DAY 90 tablet 3   rosuvastatin (CRESTOR) 40 MG tablet take 1 tablet by mouth once daily (Patient taking differently: Take 40 mg by mouth daily.) 15 tablet 0   vitamin C (ASCORBIC ACID) 250 MG tablet Take 250 mg by mouth daily.     VITAMIN D, CHOLECALCIFEROL, PO Take 1 tablet by mouth daily.      WIXELA INHUB 250-50 MCG/DOSE AEPB TAKE 1 PUFF BY MOUTH TWICE A DAY 60 each 11   albuterol (PROVENTIL HFA;VENTOLIN HFA) 108 (90 BASE) MCG/ACT inhaler Inhale 2 puffs into the lungs 4 (four) times daily as needed for wheezing or shortness of breath. (Patient not taking: Reported on 02/09/2021)     nitroGLYCERIN (NITROSTAT) 0.4 MG SL tablet Place 0.4 mg under the tongue every 5 (five) minutes as needed for chest pain.  (Patient not taking: Reported on 02/09/2021)     sacubitril-valsartan (ENTRESTO) 24-26 MG Take 1 tablet by mouth 2 (two) times daily.      torsemide (DEMADEX) 20 MG tablet Take 1 tablet (20 mg total) by mouth in the morning and at bedtime. (Patient not taking: No sig reported) 60 tablet 0   amoxicillin-clavulanate (AUGMENTIN) 875-125 MG tablet Take 1 tablet by mouth 2 (two) times daily. (Patient not taking: Reported on 02/09/2021) 14 tablet 0   HYDROcodone-acetaminophen (NORCO/VICODIN) 5-325 MG tablet Take 1 tablet by mouth every 6 (six) hours as needed for moderate pain or severe pain. (Patient not taking: Reported on 02/09/2021) 10 tablet 0   No facility-administered medications prior to visit.    Allergies  Allergen Reactions   Brimonidine Tartrate Shortness Of Breath    Alphagan-Shortness of breath   Brinzolamide Shortness Of Breath    AZOPT-  Shortness of breath   Latanoprost Shortness Of Breath    XALATAN- Shortness of breath   Nucynta [Tapentadol] Shortness Of Breath   Sulfa Antibiotics Palpitations   Timolol Maleate Shortness Of Breath and Other (See Comments)    TIMOPTIC- Aggravated asthma   Diltiazem Swelling     leg swelling   Rofecoxib Swelling     VIOXX- leg swelling   Vancomycin Hives and Other (See Comments)    Possible "Red Man Syndrome"? > hives/blisters   Codeine Other (See Comments)    Childhood reaction   Tamsulosin Other (See Comments)    Dizziness    Celecoxib Other (See Comments)    CELLBREX-confusion   Colchicine Diarrhea    diarrhea   Tape Rash    ROS As per HPI  PE: Vitals with BMI 02/09/2021 12/04/2020 11/24/2020  Height 6' 0"  6' 0"  6' 0"   Weight 335 lbs 338 lbs 318 lbs  BMI 45.42 95.09 32.67  Systolic 97 124 580  Diastolic 61 83 74  Pulse 75 76 74   Gen: Alert, tired-appearing but conversant as usual, no distress.  Patient is oriented to person, place, time, and situation. Affect: mildly flat as per his usual, lucid thought and speech. CV: RRR, no m/r/g.   LUNGS: CTA  bilat, nonlabored resps, good aeration in all lung fields. EXT: no clubbing or cyanosis.  1+ bilat LL pitting edema.  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 No sensation in either foot.  He has onychomycotic changes of his nails. R foot w/out ulceration or callus.  I cannot palpate DP or PT pulse on either side, body habitus prohibits much chance of this.  Cap refill <2 seconds.   LABS:  Lab Results  Component Value Date   TSH 0.905 04/29/2020   Lab Results  Component Value Date   WBC 7.2 07/14/2020   HGB 12.2 (L) 07/14/2020   HCT 36.7 (L) 07/14/2020   MCV 89.3 07/14/2020   PLT 121 (L) 07/14/2020   Lab Results  Component Value Date   IRON 59 05/24/2016   IRON 54 05/24/2016   TIBC 334 05/24/2016    FERRITIN 44.8 05/24/2016   Lab Results  Component Value Date   VITAMINB12 429 05/24/2016    Lab Results  Component Value Date   CREATININE 0.76 11/24/2020   BUN 20 11/24/2020   NA 141 11/24/2020   K 4.4 11/24/2020   CL 105 11/24/2020   CO2 30 11/24/2020   Lab Results  Component Value Date   ALT 35 11/01/2020   AST 39 11/01/2020   ALKPHOS 75 11/01/2020   BILITOT 0.4 05/07/2020   Lab Results  Component Value Date   CHOL 115 11/01/2020   Lab Results  Component Value Date   HDL 62 11/01/2020   Lab Results  Component Value Date   LDLCALC 41 11/01/2020   Lab Results  Component Value Date   TRIG 54 11/01/2020   Lab Results  Component Value Date   CHOLHDL 2 12/25/2018   Lab Results  Component Value Date   PSA 0.23 11/23/2015   PSA 0.33 10/07/2014   PSA 0.40 09/25/2013   Lab Results  Component Value Date   HGBA1C 5.5 11/01/2020   HGBA1C 5.5 11/01/2020   HGBA1C 5.5 (A) 11/01/2020   HGBA1C 5.5 11/01/2020   Lab Results  Component Value Date   ESRSEDRATE 34 (H) 11/24/2020   Lab Results  Component Value Date   CRP 1.3 11/24/2020   IMPRESSION AND PLAN:  1) DM with diabetic peripheral neuropathy. Last a1c excellent 3 mo ago.  Diet only. Hba1c today.  Lytes/cr today.  2) CAD, hx of combined systolic/diastolic HF. Occ low bp, esp when doesn't eat/drink well (like today). Cont toprol xl 25 qd and entresto 24-26 bid, and ASA 29m qd. Has torsemide 2962mto take qd prn but doesn't have to take it much at all. Fluid balance normal today. Says cardiology f/u planned again for May 2023.  3) PAF, hx of heart block: he has pacer/icd and is on eliquis 62m21mid. No sign of bleeding. Lytes/cr check today.  4) Debilitated pt: his multiple cardiopulmonary comorbidities + neuropathy and orthopedic complications+ morbid obesity have limited his mobility to using 2 canes to ambulate, has WC at home as well.  5) Hx of chronic osteomyelitis + TAVR and R pacer lead  infection-->followed by ID. Cont amoxil 1g bid suppressive therapy. Since his foot does not appear to have osteomyelitis anymore they have stopped talking about possible L BKA but ortho is monitoring him regularly, as is ID MD.  6) Preventative health care: Shingrix rx sent to pharmacy today.  An After Visit Summary was printed  and given to the patient.  FOLLOW UP: Return in about 3 months (around 05/12/2021) for routine chronic illness f/u.  Signed:  Crissie Sickles, MD           02/09/2021

## 2021-02-10 ENCOUNTER — Encounter: Payer: Self-pay | Admitting: Orthopedic Surgery

## 2021-02-10 ENCOUNTER — Ambulatory Visit (INDEPENDENT_AMBULATORY_CARE_PROVIDER_SITE_OTHER): Payer: Medicare Other | Admitting: Orthopedic Surgery

## 2021-02-10 DIAGNOSIS — L97929 Non-pressure chronic ulcer of unspecified part of left lower leg with unspecified severity: Secondary | ICD-10-CM

## 2021-02-10 DIAGNOSIS — L97521 Non-pressure chronic ulcer of other part of left foot limited to breakdown of skin: Secondary | ICD-10-CM | POA: Diagnosis not present

## 2021-02-10 DIAGNOSIS — Z89432 Acquired absence of left foot: Secondary | ICD-10-CM

## 2021-02-10 DIAGNOSIS — I87332 Chronic venous hypertension (idiopathic) with ulcer and inflammation of left lower extremity: Secondary | ICD-10-CM

## 2021-02-10 LAB — BASIC METABOLIC PANEL
BUN: 18 mg/dL (ref 6–23)
CO2: 28 mEq/L (ref 19–32)
Calcium: 8.8 mg/dL (ref 8.4–10.5)
Chloride: 106 mEq/L (ref 96–112)
Creatinine, Ser: 0.75 mg/dL (ref 0.40–1.50)
GFR: 84.81 mL/min (ref >60.00)
Glucose, Bld: 123 mg/dL — ABNORMAL HIGH (ref 70–99)
Potassium: 4.3 mEq/L (ref 3.5–5.1)
Sodium: 141 mEq/L (ref 135–145)

## 2021-02-10 LAB — HEMOGLOBIN A1C: Hgb A1c MFr Bld: 6 % (ref 4.6–6.5)

## 2021-02-10 NOTE — Progress Notes (Signed)
Office Visit Note   Patient: Tim [name]           Date of Birth: 1940/12/28           MRN: [id number] Visit Date: 02/10/2021              Requested by: Tammi Sou, MD [address] [place],  [place] [address] PCP: Tammi Sou, MD  Chief Complaint  Patient presents with   Left Foot - Follow-up    08/29/19 left foot transmet amputation       HPI: Patient is an 80 year old gentleman who presents he is about a year and a half status post left transmetatarsal amputation.  Patient has recurrent ulcerations over the tip of the transmetatarsal amputation with increased callus formation.  Patient is wearing CircAid compression for his venous insufficiency states the ulcers have not reoccurred.  Assessment & Plan: Visit Diagnoses:  1. Non-pressure chronic ulcer of other part of left foot limited to breakdown of skin ([place])   2. History of transmetatarsal amputation of left foot ([place])   3. Chronic venous hypertension (idiopathic) with ulcer and inflammation of left lower extremity (HCC)     Plan: Continue with compression involving the foot and leg.  Continue with his protective shoe wear with the carbon plate.  Follow-Up Instructions: Return in about 3 weeks (around 03/03/2021).   Ortho Exam  [name], oriented, no adenopathy, well-dressed, normal affect, normal respiratory effort. Examination patient is ambulating in a wheelchair.  Examination of the leg there were no open venous ulcers.  The left foot has a ulcer over the tip of the transmetatarsal amputation.  After informed consent a 10 blade knife was used to debride the skin and soft tissue back to healthy viable granulation tissue this did not probe to bone or tendon no abscess.  Silver nitrate was used for hemostasis the ulcer was 3 cm in diameter and 2 mm deep after debridement.  Iodosorb ointment was applied plus 4 x 4 gauze plus Coban wrap.  Imaging: No results found. No images are attached to the  encounter.  Labs: Lab Results  Component Value Date   HGBA1C 5.5 11/01/2020   HGBA1C 5.5 11/01/2020   HGBA1C 5.5 (A) 11/01/2020   HGBA1C 5.5 11/01/2020   ESRSEDRATE 34 (H) 11/24/2020   ESRSEDRATE 38 (H) 07/14/2020   ESRSEDRATE 20 02/09/2016   CRP 1.3 11/24/2020   CRP 0.9 07/14/2020   CRP 0.7 05/03/2020   LABURIC 3.5 (L) 09/10/2013   REPTSTATUS 05/04/2020 FINAL 04/28/2020   CULT  04/28/2020    NO GROWTH 5 DAYS Performed at [facility], [place] [address], [place], [place] [address]    LABORGA ENTEROCOCCUS FAECALIS 09/11/2019     Lab Results  Component Value Date   ALBUMIN 3.6 11/01/2020   ALBUMIN 3.0 (L) 05/07/2020   ALBUMIN 2.9 (L) 05/06/2020    Lab Results  Component Value Date   MG 1.7 05/07/2020   MG 1.6 (L) 05/06/2020   MG 1.8 05/05/2020   No results found for: VD25OH  No results found for: PREALBUMIN CBC EXTENDED Latest Ref Rng & Units 07/14/2020 05/07/2020 05/06/2020  WBC 3.8 - 10.8 Thousand/uL 7.2 5.5 5.6  RBC 4.20 - 5.80 Million/uL 4.11(L) 3.54(L) 3.41(L)  HGB 13.2 - 17.1 g/dL 12.2(L) 10.2(L) 10.1(L)  HCT 38.5 - 50.0 % 36.7(L) 32.1(L) 31.0(L)  PLT 140 - 400 Thousand/uL 121(L) 133(L) 129(L)  NEUTROABS 1,500 - 7,800 cells/uL 4,694 3.0 3.2  LYMPHSABS 850 - 3,900 cells/uL 1,390 1.6 1.6     There is no height or weight on file to calculate BMI.  Orders:  No orders of the defined types were placed in this encounter.  No orders of the defined types were placed in this encounter.    Procedures: No procedures performed  Clinical Data: No additional findings.  ROS:  All other systems negative, except as noted in the HPI. Review of Systems  Objective: Vital Signs: There were no vitals taken for this visit.  Specialty Comments:  No specialty comments available.  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   ICD (implantable cardioverter-defibrillator) infection ([place]) 12/22/2019   Gram-positive bacteremia 11/27/2019   FUO (fever of unknown origin) 11/26/2019   Low blood pressure reading 11/05/2019   Wound dehiscence 10/22/2019   PFO (patent foramen ovale) 10/06/2019   ICD (implantable cardioverter-defibrillator) battery depletion    Pressure injury of skin 09/13/2019   Enterococcal bacteremia history  09/13/2019   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   Urinary retention 09/13/2019   Phimosis 09/13/2019   SIRS (systemic inflammatory response syndrome) ([place]) 09/12/2019   Sepsis ([place]) 09/12/2019   Enterococcus UTI 09/12/2019   Cellulitis of foot    History of transmetatarsal amputation of left foot ([place])    Subacute osteomyelitis, left ankle and foot ([place])    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 ([place]) 04/17/2018   Diabetic neuropathy, painful ([place]) 12/26/2016   Neuropathic pain of foot, left 06/20/2016   Insulin resistance 07/22/2015   Status post amputation of left great toe ([place]) 06/08/2015   Spinal stenosis of lumbar region 02/16/2015   Hereditary and idiopathic peripheral neuropathy 01/12/2015   Oral thrush 12/28/2014   Orthostatic dizziness 05/24/2014   Paresthesias with subjective weakness 01/30/2014   Bilateral thigh pain 01/30/2014   Abdominal wall pain in right upper quadrant 01/22/2014   Gross hematuria 09/25/2013   Intertrigo 09/25/2013   IBS (irritable bowel syndrome) 09/11/2013   Chronic pain syndrome 08/06/2013   CAD (coronary artery disease), native coronary artery 12/25/2012   Paroxysmal atrial fibrillation  ([employer]) 12/25/2012   Moderate aortic stenosis 12/25/2012   Aortic stenosis 12/24/2012   Personal history of colonic adenoma 10/30/2012   Diverticulosis of colon (without mention of hemorrhage) 10/30/2012   Internal hemorrhoids 10/30/2012   Change in bowel habits intermittent loose stools 10/30/2012   Routine health maintenance 06/09/2012   Hereditary peripheral neuropathy 10/10/2011   Long term current use of anticoagulant - apixiban 08/16/2010   Dyspnea on exertion 09/08/2009   HYPERCHOLESTEROLEMIA-PURE 01/13/2009   Essential hypertension, benign 01/13/2009   EDEMA 01/13/2009   GOUT 01/12/2009   GLAUCOMA 01/12/2009   Venous insufficiency (chronic) (peripheral) 01/12/2009   COPD (chronic obstructive pulmonary disease) ([place]) 01/12/2009   VENTRAL HERNIA 01/12/2009   OSTEOARTHRITIS 01/12/2009   ARTHRITIS 01/12/2009   DEGENERATIVE DISC DISEASE 01/12/2009   CATARACT, HX OF 01/12/2009   HEART MURMUR, HX OF 01/12/2009   BENIGN PROSTATIC HYPERTROPHY, HX OF 01/12/2009   Obesity 01/17/2007   OBSTRUCTIVE SLEEP  APNEA 01/17/2007   ASTHMA 01/17/2007   GERD 01/17/2007   HALLUX VALGUS, ACQUIRED 01/17/2007   Pulmonary hypertension ([place]) 01/17/2007   Past Medical History:  Diagnosis Date   AICD (automatic cardioverter/defibrillator) present    Balanitis    +severe phimosis+ buried penis->circ not possible but dorsal slit done 10/2019   BPH (benign prostatic hypertrophy)    with urinary retention.  Renal u/s 12/04/19 NORMAL KIDNEYS, NORMAL BLADDER, NO HYDRONEPHROSIS   CAD (coronary artery disease)    Nonobstructive by 12/2012 cath; then 03/2016 he required BMS to RCA ([facility]).  In-stent restenosis on cath 11/01/16, baloon angioplasty successful.   CATARACT, HX OF    Chronic combined systolic and diastolic CHF (congestive heart failure) ([place]) 05/2016   Ischemic CM.  04/2018 EF 40-45%, grd I DD.   Chronic pain syndrome    Lumbar DDD; chronic neuropathic pain (DM); has spinal stimulator and sees pain  mgmt MD   Complicated UTI (urinary tract infection) 09/2019   Phimoses, acute urinary retention, entoerococcus UTI, enterococc bacteremia.  F/u blood clx's neg x 5d.     COPD    Diabetes mellitus type 2 with complications ([place])    QMG8Q jumped from 5.7% to 6.1% 03/2015---started metformin at that time.  DM 2 dx by fasting gluc criteria 2018.  Has chronic neuropath pain   Enterococcal bacteremia 09/2019   Phimoses, acute urinary retention, entoerococcus UTI, enterococc bacteremia.  F/u blood clx's neg x 5d.     Essential hypertension, benign    Fatty liver 2007   2007 u/s showed fatty liver with hepatosplenomegaly.  2019 repeat u/s->fatty liver but no cirrhosis or hepatosplenomegaly.   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 Tommy Medal)   Lumbosacral neuritis    Lumbosacral spondylosis    Lumbar spinal stenosis with neurogenic claudication--contributes to his chronic pain syndrome   Normal memory function 08/2014   Neuropsychological testing ([facility]): no cognitive impairment or sign of neurodegenerative disorder.  Likely has adjustment d/o with mixed anxiety/depressed features and may benefit from low dose SNRI.     Normocytic anemia 03/2016   Mild-pt needs ferritin and vit B12 level checked (as of 03/22/16). Hb stable 09/2019   NSTEMI (non-ST elevated myocardial infarction) ([place]) 03/20/2016   BMS to RCA   Obesity hypoventilation syndrome (HCC)    Orthostatic hypotension    OSA on CPAP    8 cm H2O   OSTEOARTHRITIS    PAF (paroxysmal atrial fibrillation) (HCC)    Paroxysmal atrial fibrillation ([place]) 2003    (? chronic?) Off anticoag for a  while due to falls.  Then apixaban started 12/2014.   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 Ella Bodo): neuropathic pain->diff to treat, failed nucynta, failed spinal stimul trial, oxycontin hs + tramadol + gabap as of 12/2017 f/u Dr. Letta Pate.   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   Sick sinus syndrome (HCC)    PPM placed   Thrombocytopenia ([place]) 2018   HSM on 2007 abd u/s---suspect some mild splenic sequestration chronically.   Unspecified glaucoma(365.9)    Unspecified hereditary and idiopathic peripheral neuropathy approx age 41   bilat LE's, ? left arm, too.  Feet became progressively numb + left foot pain intermittently.  Pt may be trying a spinal stimulator (as of 05/2015)   VENOUS INSUFFICIENCY    Being followed by Dr. Sharol Given as of 10/2016 for two R LL venous stasis ulcers/skin tears.  Healed as of 10/30/16 f/u with Dr. Sharol Given.   VENTRAL HERNIA    Wound dehiscence 10/22/2019    Family History  Problem Relation Age of Onset   Hypertension Mother    Coronary artery disease Mother    Heart attack Mother    Neuropathy Mother    Pulmonary fibrosis Father        asbestosis    Past Surgical History:  Procedure Laterality Date   AMPUTATION Left 04/11/2013   Procedure: AMPUTATION DIGIT  Left 3rd toe;  Surgeon: [name], MD;  Location: [facility];  Service: Orthopedics;  Laterality: Left;  Left 3rd toe amputation at [facility]   AMPUTATION Left 08/29/2019   Procedure: LEFT TRANSMETATARSAL AMPUTATION;  Surgeon: [name], MD;  Location: [facility];  Service: Orthopedics;  Laterality: Left;   BIOPSY  05/04/2020   Procedure: BIOPSY;  Surgeon: Jackquline Denmark, MD;  Location: [facility] ENDOSCOPY;  Service: Endoscopy;;   [place]; 03/10/16   1997 Non-obstructive disease.  03/2016 BMS to RCA, with 25% pDiag dz, o/w normal cors per cath 03/07/16.  Cath 11/01/16: in stent restenosis, successful baloon angioplasty. 08/2020 branch vessel dz, cont medical therapy rec'd.   CARDIAC CATHETERIZATION  12/24/2012   mild < 20% LCx, prox 30% RCA; LVEF 55-65% , moderate pulmonary HTN, moderate AS   CARDIAC DEFIBRILLATOR PLACEMENT  11/07/2017   [facility] MRI Quad CRT defibrillator   CARDIOVASCULAR STRESS TEST  05/11/16 ([facility])   2017 Myocardial perfusion imaging:  No ischemia; scar in apex, global hypokinesis, EF 36%.  06/15/20->mod primarily fixed inferolat wall defect mildly worse with stress c/w infarct/scar with mild peri-infarct ischemia, normal EF-->for cath per cards.   Carotid dopplers  03/09/2016   [facility]: no hemodynamically significant stenosis on either side.   CHOLECYSTECTOMY     COLONOSCOPY [name] 10/30/2012   Procedure: COLONOSCOPY;  Surgeon: [name], MD;  Location: [facility] ENDOSCOPY;  Service: Endoscopy;  Laterality: [name];   CORONARY ANGIOPLASTY WITH STENT PLACEMENT  03/2016; 04/2017   2017-[facility]: BMS to RCA-pt was placed on Brilinta.  04/2017: DES to RCA.   DORSAL SLIT [name] 10/29/2019   for severe phimosis. Procedure: DORSAL SLIT;  Surgeon: Alexis Frock, MD;  Location: [facility] ORS;  Service: Urology;  Laterality: [name];  [place]  05/04/2020   Procedure: ESOPHAGEAL DILATION;  Surgeon: Jackquline Denmark, MD;  Location: [name] ENDOSCOPY;  Service: Endoscopy;;   ESOPHAGOGASTRODUODENOSCOPY  (EGD) WITH PROPOFOL [name] 05/04/2020   Procedure: ESOPHAGOGASTRODUODENOSCOPY (EGD) WITH  PROPOFOL;  Surgeon: Jackquline Denmark, MD;  Location: [name] ENDOSCOPY;  Service: Endoscopy;  Laterality: [name];   EYE SURGERY Bilateral cataract   HEMORRHOID SURGERY     INTRAOCULAR LENS INSERTION Bilateral    KNEE SURGERY Right    LEFT AND RIGHT HEART CATHETERIZATION WITH CORONARY ANGIOGRAM [name] 12/24/2012   Procedure: LEFT AND RIGHT HEART CATHETERIZATION WITH CORONARY ANGIOGRAM;  Surgeon: [name], MD;  Location: [facility] C[facility] LAB;  Service: Cardiovascular;  Laterality: [name];   LEG SURGERY Bilateral    lenghtening    PACEMAKER PLACEMENT  04/13/2016   2nd deg HB after TAVR, pt had DC MDT PPM placed.   SHOULDER ARTHROSCOPY  08/30/2011   Procedure: ARTHROSCOPY SHOULDER;  Surgeon: [name], MD;  Location: [facility];  Service: Orthopedics;  Laterality: Right;  Right Shoulder Arthroscopy, Debridement, and Decompression   SPINAL CORD STIMULATOR INSERTION [name] 09/10/2015   Procedure: LUMBAR SPINAL CORD STIMULATOR INSERTION;  Surgeon: [name], MD;  Location: [facility]URO ORS;  Service: Neurosurgery;  Laterality: [name];   TEE WITHOUT CARDIOVERSION [name] 09/16/2019   Procedure: TRANSESOPHAGEAL ECHOCARDIOGRAM (TEE);  Surgeon: Skeet Latch, MD;  Location: [facility] ENDOSCOPY;  Service: Cardiovascular;  Laterality: [name];   TEE WITHOUT CARDIOVERSION [name] 12/02/2019   +vegetation on AVR and pacer lead in RV.  EF 55-60%, normal wall motion.  Valves function normal.  Procedure: TRANSESOPHAGEAL ECHOCARDIOGRAM (TEE);  Surgeon: Geralynn Rile, MD;  Location: [facility];  Service: Cardiovascular;  Laterality: [name];   TOE AMPUTATION Left    due to osteomyelitis.  R big toe surg due to osteoarth   TONSILLECTOMY     traeculectomy Left    eye   TRANSCATHETER AORTIC VALVE REPLACEMENT, TRANSFEMORAL  04/11/2016   TRANSESOPHAGEAL ECHOCARDIOGRAM  03/09/2016; 09/2019   [facility]: EF 55-60%, PFO seen with bi-directional shunting, no thrombus in appendage.   09/2019 ->no valvular vegetations. Small patent foramen ovale with predominantly left to right shunting across the interatrial septum.   TRANSTHORACIC ECHOCARDIOGRAM  01/2015; 01/2016; 05/18/16; 09/18/16, 05/2017, 08/2017   01/2015 No signif change in aortic stenosis (moderate).  01/2016 Severe LVH w/small LV cavity, EF 60-65%, grade I diast dysfxn.  05/2016 (s/p TAVR): EF 50-55%, grd I DD, biopros AV good.  08/2016--EF 50-55%, LV septal motion c/w conduction abnl, grd I DD,mild MS,bioprosth aortic valve well seated, w/trace AR. 05/2017 TTE EF 35%. 08/2017-EF 35%, mod diff hypokin LV, grd I DD, biopros AV good.    TRANSTHORACIC ECHOCARDIOGRAM  04/2018; 09/2019   04/2018: EF 40-45%, mod diffuse LV hypokinesis, grd I DD, bioprosth AV well seated, no AS or AR. 09/2019 EF 60-65%, grd I DD, valves fine, including bioprosth AV. 04/29/20 (tech diff) EF 55-60%, grd I DD, vegetation on MV.   VITRECTOMY     Social History   Occupational History   Occupation: Engineer, production    Comment: retired  Tobacco Use   Smoking status: Never   Smokeless tobacco: Never  Vaping Use   Vaping Use: Never used  Substance and Sexual Activity   Alcohol use: No    Alcohol/week: 0.0 standard drinks   Drug use: No    Types: Oxycodone   Sexual activity: Not Currently

## 2021-02-18 ENCOUNTER — Other Ambulatory Visit: Payer: Self-pay | Admitting: Family Medicine

## 2021-02-23 ENCOUNTER — Other Ambulatory Visit: Payer: Self-pay

## 2021-02-23 ENCOUNTER — Encounter: Payer: Self-pay | Admitting: Infectious Disease

## 2021-02-23 ENCOUNTER — Ambulatory Visit (INDEPENDENT_AMBULATORY_CARE_PROVIDER_SITE_OTHER): Payer: Medicare Other | Admitting: Infectious Disease

## 2021-02-23 VITALS — BP 127/78 | HR 73 | Temp 98.1°F

## 2021-02-23 DIAGNOSIS — R5381 Other malaise: Secondary | ICD-10-CM

## 2021-02-23 DIAGNOSIS — I38 Endocarditis, valve unspecified: Secondary | ICD-10-CM

## 2021-02-23 DIAGNOSIS — R7881 Bacteremia: Secondary | ICD-10-CM

## 2021-02-23 DIAGNOSIS — Z952 Presence of prosthetic heart valve: Secondary | ICD-10-CM

## 2021-02-23 DIAGNOSIS — T826XXD Infection and inflammatory reaction due to cardiac valve prosthesis, subsequent encounter: Secondary | ICD-10-CM | POA: Diagnosis not present

## 2021-02-23 DIAGNOSIS — T827XXD Infection and inflammatory reaction due to other cardiac and vascular devices, implants and grafts, subsequent encounter: Secondary | ICD-10-CM | POA: Diagnosis not present

## 2021-02-23 DIAGNOSIS — B952 Enterococcus as the cause of diseases classified elsewhere: Secondary | ICD-10-CM

## 2021-02-23 DIAGNOSIS — R531 Weakness: Secondary | ICD-10-CM

## 2021-02-23 DIAGNOSIS — I330 Acute and subacute infective endocarditis: Secondary | ICD-10-CM

## 2021-02-23 DIAGNOSIS — M86272 Subacute osteomyelitis, left ankle and foot: Secondary | ICD-10-CM

## 2021-02-23 DIAGNOSIS — E1140 Type 2 diabetes mellitus with diabetic neuropathy, unspecified: Secondary | ICD-10-CM | POA: Diagnosis not present

## 2021-02-23 HISTORY — DX: Weakness: R53.1

## 2021-02-23 HISTORY — DX: Other malaise: R53.81

## 2021-02-23 MED ORDER — AMOXICILLIN 500 MG PO TABS: 1000.0000 mg | ORAL_TABLET | Freq: Two times a day (BID) | ORAL | 11 refills | Status: DC

## 2021-02-23 NOTE — Progress Notes (Signed)
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   MRN: [id number]  HPI  80 y.o. male who  initially presented with AMP sensitive enterococcal UTI complicated by enterococcal bacteremia in March 2021 in the setting of AICD and TAVR. TTE and TEE at the time were without evidence of vegetation and was prescribed 2 weeks of ampicillin. On follow up on 4/21 there was worsening of his amputation site with erythema and drainage. He was placed on doxycycline by Dr. Sharol Given which was then changed to Zyvox by me. This resolved his symptoms. On follow up with myself  on 11/26/2019 he was experiencing fevers and dizziness with concern for systemic infection. Blood cultures in the ID clinic were positive for Enterococcus faecalis bacteremia. Surgical site was well healed at that time. 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 Recommended treatment was 6 weeks of ampicillin and ceftriaxone through 6/21. Feeling better on 6/21 and transitioned to life-long suppression with amoxicillin three times daily.    [name] was then seen on 7/15 /2021 seen by Dr. Johnnye Sima with concern for cellulitis and was continued on his Amoxicillin. He followed up with Dr. Johnnye Sima on 8/6 and continued to do well with Amoxicillin.   Blood cultures no growth but MV had vegetation. TEE was not successful.  He was retreated with ampicillin high-dose and ceftriaxone twice daily.  He completed 6 weeks of therapy and now is on high-dose amoxicillin to 1000 g twice daily.  Followed very closely by Dr. Sharol Given for his chronic surgical wound site where he had osteomyelitis in that area is been doing well according to him and also going to Dr. Jess Barters notes.  He has not had fevers chills nausea vomiting or other systemic symptoms to suggest infection.  He does  have chronic fatigue which he attributes to his lack of mobility and his cardiac problems       Past Medical History:  Diagnosis Date   AICD (automatic cardioverter/defibrillator) present    Balanitis    +severe phimosis+ buried penis->circ not possible but dorsal slit done 10/2019   BPH (benign prostatic hypertrophy)    with urinary retention.  Renal u/s 12/04/19 NORMAL KIDNEYS, NORMAL BLADDER, NO HYDRONEPHROSIS   CAD (coronary artery disease)    Nonobstructive by 12/2012 cath; then 03/2016 he required BMS to RCA ([facility]).  In-stent restenosis on cath 11/01/16, baloon angioplasty successful.   CATARACT, HX OF    Chronic combined systolic and diastolic CHF (congestive heart failure) ([place]) 05/2016   Ischemic CM.  04/2018 EF 40-45%, grd I DD.   Chronic pain syndrome    Lumbar DDD; chronic neuropathic pain (DM); has spinal stimulator and sees pain mgmt MD   Complicated UTI (urinary tract infection) 09/2019   Phimoses, acute urinary retention, entoerococcus UTI, enterococc bacteremia.  F/u blood clx's neg x 5d.     COPD    Diabetes mellitus type 2 with complications ([place])    CBU3A jumped from 5.7% to 6.1% 03/2015---started metformin at that time.  DM 2 dx by fasting gluc criteria 2018.  Has chronic neuropath pain   Enterococcal bacteremia 09/2019   Phimoses, acute urinary retention, entoerococcus UTI, enterococc bacteremia.  F/u blood clx's neg x 5d.     Essential hypertension, benign    Fatty liver 2007   2007 u/s showed fatty liver with hepatosplenomegaly.  2019 repeat u/s->fatty liver but no cirrhosis or hepatosplenomegaly.   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 Tommy Medal)   Lumbosacral neuritis    Lumbosacral spondylosis    Lumbar spinal stenosis with neurogenic claudication--contributes to his chronic pain syndrome   Normal memory function 08/2014   Neuropsychological testing ([facility]): no cognitive impairment or sign of neurodegenerative disorder.  Likely has adjustment d/o with mixed anxiety/depressed features and may benefit from low dose SNRI.     Normocytic anemia 03/2016   Mild-pt needs ferritin and vit B12 level checked (as of 03/22/16). Hb stable 09/2019   NSTEMI (non-ST elevated myocardial infarction) ([place]) 03/20/2016   BMS to RCA   Obesity hypoventilation syndrome (HCC)    Orthostatic hypotension    OSA on CPAP    8 cm H2O   OSTEOARTHRITIS    PAF (paroxysmal atrial fibrillation) (HCC)    Paroxysmal atrial fibrillation ([place]) 2003    (? chronic?) Off anticoag for a while due to falls.  Then apixaban started 12/2014.   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 Ella Bodo): neuropathic pain->diff to treat, failed nucynta, failed spinal stimul trial, oxycontin hs + tramadol + gabap as of 12/2017 f/u Dr. Letta Pate.   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   Sick sinus syndrome (HCC)    PPM placed    Thrombocytopenia ([place]) 2018   HSM on 2007 abd u/s---suspect some mild splenic sequestration chronically.   Unspecified glaucoma(365.9)    Unspecified hereditary and idiopathic peripheral neuropathy approx age 36   bilat LE's, ? left arm, too.  Feet became progressively numb + left foot pain intermittently.  Pt may be trying a spinal stimulator (as of 05/2015)   VENOUS INSUFFICIENCY    Being followed by Dr. Sharol Given as of 10/2016 for two R LL venous stasis ulcers/skin tears.  Healed as of 10/30/16 f/u with Dr. Sharol Given.   VENTRAL HERNIA    Wound dehiscence 10/22/2019    Past Surgical History:  Procedure Laterality Date   AMPUTATION Left 04/11/2013   Procedure: AMPUTATION DIGIT Left 3rd toe;  Surgeon: [name], MD;  Location: [place];  Service: Orthopedics;  Laterality: Left;  Left 3rd toe amputation at [facility]   AMPUTATION Left 08/29/2019   Procedure: LEFT TRANSMETATARSAL AMPUTATION;  Surgeon: [name], MD;  Location: [place];  Service: Orthopedics;  Laterality: Left;   BIOPSY  05/04/2020   Procedure:  BIOPSY;  Surgeon: Jackquline Denmark, MD;  Location: [facility] ENDOSCOPY;  Service: Endoscopy;;   [place]; 03/10/16   1997 Non-obstructive disease.  03/2016 BMS to RCA, with 25% pDiag dz, o/w normal cors per cath 03/07/16.  Cath 11/01/16: in stent restenosis, successful baloon angioplasty. 08/2020 branch vessel dz, cont medical therapy rec'd.   CARDIAC CATHETERIZATION  12/24/2012   mild < 20% LCx, prox 30% RCA; LVEF 55-65% , moderate pulmonary HTN, moderate AS   CARDIAC DEFIBRILLATOR PLACEMENT  11/07/2017   [facility] MRI Quad CRT defibrillator   CARDIOVASCULAR STRESS TEST  05/11/16 ([facility])   2017 Myocardial perfusion imaging:  No ischemia; scar in apex, global hypokinesis, EF 36%.  06/15/20->mod primarily fixed inferolat wall defect mildly worse with stress c/w infarct/scar with mild peri-infarct ischemia, normal EF-->for cath per cards.   Carotid dopplers  03/09/2016   [facility]: no hemodynamically  significant stenosis on either side.   CHOLECYSTECTOMY     COLONOSCOPY [name] 10/30/2012   Procedure: COLONOSCOPY;  Surgeon: [name], MD;  Location: [facility] ENDOSCOPY;  Service: Endoscopy;  Laterality: [name];   CORONARY ANGIOPLASTY WITH STENT PLACEMENT  03/2016; 04/2017   2017-[facility]: BMS to RCA-pt was placed on Brilinta.  04/2017: DES to RCA.   DORSAL SLIT [name] 10/29/2019   for severe phimosis. Procedure: DORSAL SLIT;  Surgeon: Alexis Frock, MD;  Location: [facility] ORS;  Service: Urology;  Laterality: [name];  [place]  05/04/2020   Procedure: ESOPHAGEAL DILATION;  Surgeon: Jackquline Denmark, MD;  Location: [facility] ENDOSCOPY;  Service: Endoscopy;;   ESOPHAGOGASTRODUODENOSCOPY (EGD) WITH PROPOFOL [name] 05/04/2020   Procedure: ESOPHAGOGASTRODUODENOSCOPY (EGD) WITH PROPOFOL;  Surgeon: Jackquline Denmark, MD;  Location: [facility] ENDOSCOPY;  Service: Endoscopy;  Laterality: [name];   EYE SURGERY Bilateral cataract   HEMORRHOID SURGERY     INTRAOCULAR LENS INSERTION Bilateral    KNEE SURGERY Right    LEFT AND RIGHT HEART CATHETERIZATION WITH CORONARY ANGIOGRAM [name] 12/24/2012   Procedure: LEFT AND RIGHT HEART CATHETERIZATION WITH CORONARY ANGIOGRAM;  Surgeon: [name], MD;  Location: [facility] CATH LAB;  Service: Cardiovascular;  Laterality: [name];   LEG SURGERY Bilateral    lenghtening    PACEMAKER PLACEMENT  04/13/2016   2nd deg HB after TAVR, pt had DC MDT PPM placed.   SHOULDER ARTHROSCOPY  08/30/2011   Procedure: ARTHROSCOPY SHOULDER;  Surgeon: [name], MD;  Location: [place];  Service: Orthopedics;  Laterality: Right;  Right Shoulder Arthroscopy, Debridement, and Decompression   SPINAL CORD STIMULATOR INSERTION [name] 09/10/2015   Procedure: LUMBAR SPINAL CORD STIMULATOR INSERTION;  Surgeon: [name], MD;  Location: [place] NEURO ORS;  Service: Neurosurgery;  Laterality: [name];   TEE WITHOUT CARDIOVERSION [name] 09/16/2019   Procedure: TRANSESOPHAGEAL ECHOCARDIOGRAM (TEE);  Surgeon: Skeet Latch, MD;  Location: [facility]  ENDOSCOPY;  Service: Cardiovascular;  Laterality: [name];   TEE WITHOUT CARDIOVERSION [name] 12/02/2019   +vegetation on AVR and pacer lead in RV.  EF 55-60%, normal wall motion.  Valves function normal.  Procedure: TRANSESOPHAGEAL ECHOCARDIOGRAM (TEE);  Surgeon: Geralynn Rile, MD;  Location: [place];  Service: Cardiovascular;  Laterality: [name];   TOE AMPUTATION Left    due to osteomyelitis.  R big toe surg due to osteoarth   TONSILLECTOMY     traeculectomy Left    eye   TRANSCATHETER AORTIC VALVE REPLACEMENT, TRANSFEMORAL  04/11/2016   TRANSESOPHAGEAL ECHOCARDIOGRAM  03/09/2016; 09/2019   [facility]: EF 55-60%, PFO seen with bi-directional shunting, no thrombus in appendage.  09/2019 ->no valvular  vegetations. Small patent foramen ovale with predominantly left to right shunting across the interatrial septum.   TRANSTHORACIC ECHOCARDIOGRAM  01/2015; 01/2016; 05/18/16; 09/18/16, 05/2017, 08/2017   01/2015 No signif change in aortic stenosis (moderate).  01/2016 Severe LVH w/small LV cavity, EF 60-65%, grade I diast dysfxn.  05/2016 (s/p TAVR): EF 50-55%, grd I DD, biopros AV good.  08/2016--EF 50-55%, LV septal motion c/w conduction abnl, grd I DD,mild MS,bioprosth aortic valve well seated, w/trace AR. 05/2017 TTE EF 35%. 08/2017-EF 35%, mod diff hypokin LV, grd I DD, biopros AV good.    TRANSTHORACIC ECHOCARDIOGRAM  04/2018; 09/2019   04/2018: EF 40-45%, mod diffuse LV hypokinesis, grd I DD, bioprosth AV well seated, no AS or AR. 09/2019 EF 60-65%, grd I DD, valves fine, including bioprosth AV. 04/29/20 (tech diff) EF 55-60%, grd I DD, vegetation on MV.   VITRECTOMY      Family History  Problem Relation Age of Onset   Hypertension Mother    Coronary artery disease Mother    Heart attack Mother    Neuropathy Mother    Pulmonary fibrosis Father        asbestosis      Social History   Socioeconomic History   Marital status: Married    Spouse name: Not on file   Number of children: 0   Years of  education: 20   Highest education level: Not on file  Occupational History   Occupation: Engineer, production    Comment: retired  Tobacco Use   Smoking status: Never   Smokeless tobacco: Never  Scientific laboratory technician Use: Never used  Substance and Sexual Activity   Alcohol use: No    Alcohol/week: 0.0 standard drinks   Drug use: No    Types: Oxycodone   Sexual activity: Not Currently  Other Topics Concern   Not on file  Social History Narrative   HSG, [name] - BS, [name] - MS-engineering, 2 years on PhD - [place]. Married - '65 - 80yr/divorced; '76- 3 yrs/divorced; '92 . No children. 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 ACP/Living Will - Yes CPR; long-term Mechanical ventilation as long as he was able to cognate; ok for long term artificial nutrition. Precondition being able to cognate and not to have too much pain.    Social Determinants of Health   Financial Resource Strain: Not on file  Food Insecurity: Not on file  Transportation Needs: Not on file  Physical Activity: Not on file  Stress: Not on file  Social Connections: Not on file    Allergies  Allergen Reactions   Brimonidine Tartrate Shortness Of Breath    Alphagan-Shortness of breath   Brinzolamide Shortness Of Breath    AZOPT- Shortness of breath   Latanoprost Shortness Of Breath    XALATAN- Shortness of breath   Nucynta [Tapentadol] Shortness Of Breath   Sulfa Antibiotics Palpitations   Timolol Maleate Shortness Of Breath and Other (See Comments)    TIMOPTIC- Aggravated asthma   Diltiazem Swelling     leg swelling   Rofecoxib Swelling     VIOXX- leg swelling   Vancomycin Hives and Other (See Comments)    Possible "Red Man Syndrome"? > hives/blisters   Codeine Other (See Comments)    Childhood reaction   Tamsulosin Other (See Comments)    Dizziness    Celecoxib Other (See Comments)    CELLBREX-confusion   Colchicine Diarrhea    diarrhea  Tape Rash      Current Outpatient Medications:    albuterol (PROVENTIL HFA;VENTOLIN HFA) 108 (90 BASE) MCG/ACT inhaler, Inhale 2 puffs into the lungs 4 (four) times daily as needed for wheezing or shortness of breath., Disp: , Rfl:    allopurinol (ZYLOPRIM) 300 MG tablet, TAKE 1 TABLET BY MOUTH EVERY DAY WITH FOOD, Disp: 90 tablet, Rfl: 0   amoxicillin (AMOXIL) 500 MG tablet, Take 2 tablets (1,000 mg total) by mouth 2 (two) times daily., Disp: 120 tablet, Rfl: 11   apixaban (ELIQUIS) 5 MG TABS tablet, Take 5 mg by mouth 2 (two) times daily., Disp: , Rfl:    ASPIRIN LOW DOSE 81 MG EC tablet, Take 81 mg by mouth daily., Disp: , Rfl:    B Complex-C (B-COMPLEX WITH VITAMIN C) tablet, Take 1 tablet by mouth daily. Take 1 tablet daily, 240m, Disp: , Rfl:    cadexomer iodine (IODOSORB) 0.9 % gel, Apply 1 application topically daily as needed for wound care. Apply to the affected area daily plus dry dressing, Disp: 40 g, Rfl: 3   ezetimibe (ZETIA) 10 MG tablet, Take 1 tablet (10 mg total) by mouth daily., Disp: 30 tablet, Rfl: 1   finasteride (PROSCAR) 5 MG tablet, TAKE 1 TABLET BY MOUTH EVERY DAY, Disp: 90 tablet, Rfl: 0   fluticasone (FLONASE) 50 MCG/ACT nasal spray, Place 2 sprays into both nostrils as needed for allergies or rhinitis., Disp: , Rfl:    gabapentin (NEURONTIN) 800 MG tablet, TAKE 1 TABLET (800 MG TOTAL) BY MOUTH 4 (FOUR) TIMES DAILY., Disp: 360 tablet, Rfl: 2   ipratropium (ATROVENT) 0.03 % nasal spray, 2 SPRAY EACH NOSTRIL EVERY 12 HOURS FOR RUNNY NOSE, Disp: 30 mL, Rfl: 2   KLOR-CON M20 20 MEQ tablet, TAKE 1 TABLET BY MOUTH EVERY DAY, Disp: 30 tablet, Rfl: 2   metoprolol succinate (TOPROL-XL) 25 MG 24 hr tablet, Take 25 mg by mouth daily., Disp: , Rfl:    Multiple Vitamin (MULTIVITAMIN ADULT PO), Take by mouth daily., Disp: , Rfl:    niacin (NIASPAN) 1000 MG CR tablet, TAKE 1 TABLET BY MOUTH TWICE A DAY, Disp: 180 tablet, Rfl: 0   nitroGLYCERIN (NITROSTAT) 0.4 MG SL tablet, Place 0.4 mg under  the tongue every 5 (five) minutes as needed for chest pain., Disp: , Rfl:    pantoprazole (PROTONIX) 40 MG tablet, TAKE 1 TABLET BY MOUTH EVERY DAY, Disp: 90 tablet, Rfl: 1   rosuvastatin (CRESTOR) 40 MG tablet, take 1 tablet by mouth once daily (Patient taking differently: Take 40 mg by mouth daily.), Disp: 15 tablet, Rfl: 0   sacubitril-valsartan (ENTRESTO) 24-26 MG, Take 1 tablet by mouth 2 (two) times daily. , Disp: , Rfl:    vitamin C (ASCORBIC ACID) 250 MG tablet, Take 250 mg by mouth daily., Disp: , Rfl:    VITAMIN D, CHOLECALCIFEROL, PO, Take 1 tablet by mouth daily. , Disp: , Rfl:    WIXELA INHUB 250-50 MCG/DOSE AEPB, TAKE 1 PUFF BY MOUTH TWICE A DAY, Disp: 60 each, Rfl: 11   torsemide (DEMADEX) 20 MG tablet, Take 1 tablet (20 mg total) by mouth in the morning and at bedtime. 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     Reviewed labs from February 09, 2021 drawn by his primary care physician and he has normal renal function  Seem much utility to checking a sed rate and CRP at present.    I reviewed notes from cardiology primary care and from orthopedic surgeon  Diabetic foot infection with osteomyelitis: He is being followed closely by Dr. Sharol Given and I have reviewed his  notes    Weakness: Phonic and he attributes to his cardiac condition and deconditioning.  He tells me that the inpatient physical therapist did not recommend outpatient physical therapy.

## 2021-02-25 ENCOUNTER — Other Ambulatory Visit: Payer: Self-pay | Admitting: Family Medicine

## 2021-02-28 ENCOUNTER — Telehealth: Payer: Self-pay

## 2021-02-28 NOTE — Telephone Encounter (Signed)
Patient called and left a voicemail stating he broke a tooth and is scheduling to see a dentist. Patient is asking if there is anything that he should let the dentist know and does he need to be on any additional antibiotics for this.   Can I just let the patient know to make the dentist aware that he is on amoxicillin and what he is being treated for?

## 2021-02-28 NOTE — Telephone Encounter (Signed)
Patient advised to update his dentist with his current antibiotic and what he is being treated for. Pt also informed office not from [facility] can be faxed as well. Tim [name] [place] Sailors

## 2021-03-02 ENCOUNTER — Ambulatory Visit (INDEPENDENT_AMBULATORY_CARE_PROVIDER_SITE_OTHER): Payer: Medicare Other | Admitting: Family

## 2021-03-02 ENCOUNTER — Encounter: Payer: Self-pay | Admitting: Family

## 2021-03-02 DIAGNOSIS — Z89432 Acquired absence of left foot: Secondary | ICD-10-CM

## 2021-03-02 DIAGNOSIS — L97521 Non-pressure chronic ulcer of other part of left foot limited to breakdown of skin: Secondary | ICD-10-CM | POA: Diagnosis not present

## 2021-03-02 DIAGNOSIS — L97929 Non-pressure chronic ulcer of unspecified part of left lower leg with unspecified severity: Secondary | ICD-10-CM

## 2021-03-02 DIAGNOSIS — I87332 Chronic venous hypertension (idiopathic) with ulcer and inflammation of left lower extremity: Secondary | ICD-10-CM

## 2021-03-02 NOTE — Progress Notes (Signed)
Office Visit Note   Patient: Tim [name]           Date of Birth: 1940/08/08           MRN: [id number] Visit Date: 03/02/2021              Requested by: Tammi Sou, MD [address] [place],  [place] [address] PCP: Tammi Sou, MD  No chief complaint on file.     HPI: Patient is an 80 year old gentleman who presents he is about a year and a half status post left transmetatarsal amputation.  Patient has chronic ulcerations over the tip of the transmetatarsal amputation with increased callus formation.    The patient is using his medical compression stocking on the left.  Unfortunately he did have some skin breakdown and some blistering after using his CircAid sock that is placed underneath the compression wrap  Assessment & Plan: Visit Diagnoses:  No diagnosis found.   Plan: We will place him in and Dynaflex compression wrap to the left.  We will leave the front open distally so he can change dressings daily. reevaluate in 1 week  Follow-Up Instructions: Return in about 6 weeks (around 04/13/2021).   Ortho Exam  [name], oriented, no adenopathy, well-dressed, normal affect, normal respiratory effort. Examination patient is ambulating in a wheelchair.  Examination of the leg, today increased edema with blistering from areas of edema and pressure from compression garments.  There are scattered blisters over the anterior lower shin.  Medially there is a large 1 this is intact about 2 cm in diameter with clear fluid.   The left foot has a ulcer over the tip of the transmetatarsal amputation.  After informed consent a 10 blade knife was used to debride the skin and soft tissue back to healthy viable granulation tissue. this did not probe to bone or tendon no abscess.  Silver nitrate was used for hemostasis. the ulcer was 3 cm in diameter and 2 mm deep after debridement.  Iodosorb dressing placed over the ulcer.  Imaging: No results found. No images are  attached to the encounter.  Labs: Lab Results  Component Value Date   HGBA1C 6.0 02/09/2021   HGBA1C 5.5 11/01/2020   HGBA1C 5.5 11/01/2020   HGBA1C 5.5 (A) 11/01/2020   HGBA1C 5.5 11/01/2020   ESRSEDRATE 34 (H) 11/24/2020   ESRSEDRATE 38 (H) 07/14/2020   ESRSEDRATE 20 02/09/2016   CRP 1.3 11/24/2020   CRP 0.9 07/14/2020   CRP 0.7 05/03/2020   LABURIC 3.5 (L) 09/10/2013   REPTSTATUS 05/04/2020 FINAL 04/28/2020   CULT  04/28/2020    NO GROWTH 5 DAYS Performed at [facility], [place] [address] [place], [place] [address]    LABORGA ENTEROCOCCUS FAECALIS 09/11/2019     Lab Results  Component Value Date   ALBUMIN 3.6 11/01/2020   ALBUMIN 3.0 (L) 05/07/2020   ALBUMIN 2.9 (L) 05/06/2020    Lab Results  Component Value Date   MG 1.7 05/07/2020   MG 1.6 (L) 05/06/2020   MG 1.8 05/05/2020   No results found for: VD25OH  No results found for: PREALBUMIN CBC EXTENDED Latest Ref Rng & Units 07/14/2020 05/07/2020 05/06/2020  WBC 3.8 - 10.8 Thousand/uL 7.2 5.5 5.6  RBC 4.20 - 5.80 Million/uL 4.11(L) 3.54(L) 3.41(L)  HGB 13.2 - 17.1 g/dL 12.2(L) 10.2(L) 10.1(L)  HCT 38.5 - 50.0 % 36.7(L) 32.1(L) 31.0(L)  PLT 140 - 400 Thousand/uL 121(L) 133(L) 129(L)  NEUTROABS 1,500 - 7,800 cells/uL 4,694 3.0 3.2  LYMPHSABS 850 - 3,900 cells/uL 1,390 1.6 1.6     There is no height or weight on file to calculate BMI.  Orders:  No orders of the defined types were placed in this encounter.  No orders of the defined types were placed in this encounter.    Procedures: No procedures performed  Clinical Data: No additional findings.  ROS:  All other systems negative, except as noted in the HPI. Review of Systems  Objective: Vital Signs: There were no vitals taken for this visit.  Specialty Comments:  No specialty comments available.  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   ICD (implantable cardioverter-defibrillator) infection ([place]) 12/22/2019   Gram-positive bacteremia 11/27/2019   FUO (fever of unknown origin) 11/26/2019   Low blood pressure reading 11/05/2019   Wound dehiscence 10/22/2019   PFO (patent foramen ovale) 10/06/2019   ICD (implantable cardioverter-defibrillator) battery depletion    Pressure injury of skin 09/13/2019   Enterococcal bacteremia history  09/13/2019   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   Urinary retention 09/13/2019   Phimosis 09/13/2019   SIRS (systemic inflammatory response syndrome) ([place]) 09/12/2019   Sepsis ([place]) 09/12/2019   Enterococcus UTI 09/12/2019   Cellulitis of foot    History of transmetatarsal amputation of left foot ([place])    Subacute osteomyelitis, left ankle and foot ([place])    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 ([place]) 04/17/2018   Diabetic neuropathy, painful ([place]) 12/26/2016   Neuropathic pain of foot, left 06/20/2016   Insulin resistance 07/22/2015   Status post amputation of left great toe ([place]) 06/08/2015   Spinal stenosis of lumbar region 02/16/2015   Hereditary and idiopathic peripheral neuropathy 01/12/2015   Oral thrush 12/28/2014   Orthostatic dizziness 05/24/2014   Paresthesias with subjective weakness 01/30/2014   Bilateral thigh pain 01/30/2014   Abdominal wall pain in right upper quadrant 01/22/2014   Gross hematuria 09/25/2013   Intertrigo 09/25/2013   IBS (irritable bowel syndrome) 09/11/2013   Chronic pain syndrome 08/06/2013    CAD (coronary artery disease), native coronary artery 12/25/2012   Paroxysmal atrial fibrillation ([place]) 12/25/2012   Moderate aortic stenosis 12/25/2012   Aortic stenosis 12/24/2012   Personal history of colonic adenoma 10/30/2012   Diverticulosis of colon (without mention of hemorrhage) 10/30/2012   Internal hemorrhoids 10/30/2012   Change in bowel habits intermittent loose stools 10/30/2012   Routine health maintenance 06/09/2012   Hereditary peripheral neuropathy 10/10/2011   Long term current use of anticoagulant - apixiban 08/16/2010   Dyspnea on exertion 09/08/2009   HYPERCHOLESTEROLEMIA-PURE 01/13/2009   Essential hypertension, benign 01/13/2009   EDEMA 01/13/2009   GOUT 01/12/2009   GLAUCOMA 01/12/2009   Venous insufficiency (chronic) (peripheral) 01/12/2009   COPD (chronic obstructive pulmonary disease) ([place]) 01/12/2009   VENTRAL HERNIA 01/12/2009   OSTEOARTHRITIS 01/12/2009   ARTHRITIS 01/12/2009   DEGENERATIVE DISC DISEASE 01/12/2009   CATARACT, HX OF 01/12/2009   HEART MURMUR, HX  OF 01/12/2009   BENIGN PROSTATIC HYPERTROPHY, HX OF 01/12/2009   Obesity 01/17/2007   OBSTRUCTIVE SLEEP APNEA 01/17/2007   ASTHMA 01/17/2007   GERD 01/17/2007   HALLUX VALGUS, ACQUIRED 01/17/2007   Pulmonary hypertension ([place]) 01/17/2007   Past Medical History:  Diagnosis Date   AICD (automatic cardioverter/defibrillator) present    Balanitis    +severe phimosis+ buried penis->circ not possible but dorsal slit done 10/2019   BPH (benign prostatic hypertrophy)    with urinary retention.  Renal u/s 12/04/19 NORMAL KIDNEYS, NORMAL BLADDER, NO HYDRONEPHROSIS   CAD (coronary artery disease)    Nonobstructive by 12/2012 cath; then 03/2016 he required BMS to RCA ([facility]).  In-stent restenosis on cath 11/01/16, baloon angioplasty successful.   CATARACT, HX OF    Chronic combined systolic and diastolic CHF (congestive heart failure) ([place]) 05/2016   Ischemic CM.  04/2018 EF 40-45%, grd I DD.    Chronic pain syndrome    Lumbar DDD; chronic neuropathic pain (DM); has spinal stimulator and sees pain mgmt MD   Complicated UTI (urinary tract infection) 09/2019   Phimoses, acute urinary retention, entoerococcus UTI, enterococc bacteremia.  F/u blood clx's neg x 5d.     COPD    Diabetes mellitus type 2 with complications ([place])    CZY6A jumped from 5.7% to 6.1% 03/2015---started metformin at that time.  DM 2 dx by fasting gluc criteria 2018.  Has chronic neuropath pain   Enterococcal bacteremia 09/2019   Phimoses, acute urinary retention, entoerococcus UTI, enterococc bacteremia.  F/u blood clx's neg x 5d.     Essential hypertension, benign    Fatty liver 2007   2007 u/s showed fatty liver with hepatosplenomegaly.  2019 repeat u/s->fatty liver but no cirrhosis or hepatosplenomegaly.   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 Tommy Medal)   Lumbosacral neuritis    Lumbosacral spondylosis    Lumbar spinal stenosis with neurogenic claudication--contributes to his chronic pain syndrome   Normal memory function 08/2014   Neuropsychological testing ([facility]): no cognitive impairment or sign of neurodegenerative disorder.  Likely has adjustment d/o with mixed anxiety/depressed features and may benefit from low dose SNRI.     Normocytic anemia 03/2016   Mild-pt needs ferritin and vit B12 level checked (as of 03/22/16). Hb stable 09/2019   NSTEMI (non-ST elevated myocardial infarction) ([place]) 03/20/2016   BMS to RCA   Obesity hypoventilation syndrome (H[facility])    Orthostatic hypotension    OSA on CPAP    8 cm H2O   OSTEOARTHRITIS    PAF (paroxysmal  atrial fibrillation) (HCC)    Paroxysmal atrial fibrillation ([place]) 2003    (? chronic?) Off anticoag for a while due to falls.  Then apixaban started 12/2014.   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 Ella Bodo): neuropathic pain->diff to treat, failed nucynta, failed spinal stimul trial, oxycontin hs + tramadol + gabap as of 12/2017 f/u Dr. Letta Pate.   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   Sick sinus syndrome (HCC)    PPM placed   Thrombocytopenia ([place]) 2018   HSM on 2007 abd u/s---suspect some mild splenic sequestration chronically.   Unspecified glaucoma(365.9)    Unspecified hereditary and idiopathic peripheral neuropathy approx age 55   bilat LE's, ? left arm, too.  Feet became progressively numb + left foot pain intermittently.  Pt may be trying a spinal stimulator (as of 05/2015)   VENOUS INSUFFICIENCY    Being followed by Dr. Sharol Given as of 10/2016 for two R LL venous stasis ulcers/skin tears.  Healed as of 10/30/16 f/u with Dr. Sharol Given.   VENTRAL HERNIA    Weakness 02/23/2021   Wound dehiscence 10/22/2019    Family History  Problem Relation Age of Onset   Hypertension Mother    Coronary artery disease Mother    Heart attack Mother    Neuropathy Mother     Pulmonary fibrosis Father        asbestosis    Past Surgical History:  Procedure Laterality Date   AMPUTATION Left 04/11/2013   Procedure: AMPUTATION DIGIT Left 3rd toe;  Surgeon: [name], MD;  Location: [facility];  Service: Orthopedics;  Laterality: Left;  Left 3rd toe amputation at [facility]   AMPUTATION Left 08/29/2019   Procedure: LEFT TRANSMETATARSAL AMPUTATION;  Surgeon: [name], MD;  Location: [facility];  Service: Orthopedics;  Laterality: Left;   BIOPSY  05/04/2020   Procedure: BIOPSY;  Surgeon: Jackquline Denmark, MD;  Location: [facility] ENDOSCOPY;  Service: Endoscopy;;   [place]; 03/10/16   1997 Non-obstructive disease.  03/2016 BMS to RCA, with 25% pDiag dz, o/w normal cors per cath 03/07/16.  Cath 11/01/16: in stent restenosis, successful baloon angioplasty. 08/2020 branch vessel dz, cont medical therapy rec'd.   CARDIAC CATHETERIZATION  12/24/2012   mild < 20% LCx, prox 30% RCA; LVEF 55-65% , moderate pulmonary HTN, moderate AS   CARDIAC DEFIBRILLATOR PLACEMENT  11/07/2017   [facility] MRI Quad CRT defibrillator   CARDIOVASCULAR STRESS TEST  05/11/16 ([facility])   2017 Myocardial perfusion imaging:  No ischemia; scar in apex, global hypokinesis, EF 36%.  06/15/20->mod primarily fixed inferolat wall defect mildly worse with stress c/w infarct/scar with mild peri-infarct ischemia, normal EF-->for cath per cards.   Carotid dopplers  03/09/2016   [facility]: no hemodynamically significant stenosis on either side.   CHOLECYSTECTOMY     COLONOSCOPY [name] 10/30/2012   Procedure: COLONOSCOPY;  Surgeon: [name], MD;  Location: [facility] ENDOSCOPY;  Service: Endoscopy;  Laterality: [name];   CORONARY ANGIOPLASTY WITH STENT PLACEMENT  03/2016; 04/2017   2017-[facility]: BMS to RCA-pt was placed on Brilinta.  04/2017: DES to RCA.   DORSAL SLIT [name] 10/29/2019   for severe phimosis. Procedure: DORSAL SLIT;  Surgeon: Alexis Frock, MD;  Location: [facility] ORS;  Service: Urology;  Laterality: [name];  [place]  05/04/2020   Procedure: ESOPHAGEAL  DILATION;  Surgeon: Jackquline Denmark, MD;  Location: [facility] ENDOSCOPY;  Service: Endoscopy;;   ESOPHAGOGASTRODUODENOSCOPY (EGD) WITH PROPOFOL [name] 05/04/2020   Procedure: ESOPHAGOGASTRODUODENOSCOPY (EGD) WITH PROPOFOL;  Surgeon: Jackquline Denmark, MD;  Location: [facility] ENDOSCOPY;  Service: Endoscopy;  Laterality: [name];   EYE SURGERY Bilateral cataract   HEMORRHOID SURGERY     INTRAOCULAR LENS INSERTION Bilateral    KNEE SURGERY Right    LEFT AND RIGHT HEART CATHETERIZATION WITH CORONARY ANGIOGRAM [name] 12/24/2012   Procedure: LEFT AND RIGHT HEART CATHETERIZATION WITH CORONARY ANGIOGRAM;  Surgeon: [name], MD;  Location: [facility] CATH LAB;  Service: Cardiovascular;  Laterality: [name];   LEG SURGERY Bilateral    lenghtening    PACEMAKER PLACEMENT  04/13/2016   2nd deg HB after TAVR, pt had DC MDT PPM placed.   SHOULDER ARTHROSCOPY  08/30/2011   Procedure: ARTHROSCOPY SHOULDER;  Surgeon: [name], MD;  Location: [facility];  Service: Orthopedics;  Laterality: Right;  Right Shoulder Arthroscopy, Debridement, and Decompression   SPINAL CORD STIMULATOR INSERTION [name] 09/10/2015   Procedure: LUMBAR SPINAL CORD STIMULATOR INSERTION;  Surgeon: [name], MD;  Location: [facility] NEURO ORS;  Service: Neurosurgery;  Laterality: [name];   TEE WITHOUT CARDIOVERSION [name] 09/16/2019   Procedure: TRANSESOPHAGEAL ECHOCARDIOGRAM (TEE);  Surgeon: Skeet Latch, MD;  Location: [facility] ENDOSCOPY;  Service: Cardiovascular;  Laterality: [name];   TEE WITHOUT CARDIOVERSION [name] 12/02/2019   +vegetation on AVR and pacer lead in RV.  EF 55-60%, normal wall motion.  Valves function normal.  Procedure: TRANSESOPHAGEAL ECHOCARDIOGRAM (TEE);  Surgeon: Geralynn Rile, MD;  Location: [facility];  Service: Cardiovascular;  Laterality: [name];   TOE AMPUTATION Left    due to osteomyelitis.  R big toe surg due to osteoarth   TONSILLECTOMY     traeculectomy Left    eye   TRANSCATHETER AORTIC VALVE  REPLACEMENT, TRANSFEMORAL  04/11/2016   TRANSESOPHAGEAL ECHOCARDIOGRAM  03/09/2016; 09/2019   [facility]: EF 55-60%, PFO seen with bi-directional shunting, no thrombus in appendage.  09/2019 ->no valvular vegetations. Small patent foramen ovale with predominantly left to right shunting across the interatrial septum.   TRANSTHORACIC ECHOCARDIOGRAM  01/2015; 01/2016; 05/18/16; 09/18/16, 05/2017, 08/2017   01/2015 No signif change in aortic stenosis (moderate).  01/2016 Severe LVH w/small LV cavity, EF 60-65%, grade I diast dysfxn.  05/2016 (s/p TAVR): EF 50-55%, grd I DD, biopros AV good.  08/2016--EF 50-55%, LV septal motion c/w conduction abnl, grd I DD,mild MS,bioprosth aortic valve well seated, w/trace AR. 05/2017 TTE EF 35%. 08/2017-EF 35%, mod diff hypokin LV, grd I DD, biopros AV good.    TRANSTHORACIC ECHOCARDIOGRAM  04/2018; 09/2019   04/2018: EF 40-45%, mod diffuse LV hypokinesis, grd I DD, bioprosth AV well seated, no AS or AR. 09/2019 EF 60-65%, grd I DD, valves fine, including bioprosth AV. 04/29/20 (tech diff) EF 55-60%, grd I DD, vegetation on MV.   VITRECTOMY     Social History   Occupational History   Occupation: Engineer, production    Comment: retired  Tobacco Use   Smoking status: Never   Smokeless tobacco: Never  Vaping Use   Vaping Use: Never used  Substance and Sexual Activity   Alcohol use: No    Alcohol/week: 0.0 standard drinks   Drug use: No    Types: Oxycodone   Sexual activity: Not Currently

## 2021-03-09 ENCOUNTER — Ambulatory Visit (INDEPENDENT_AMBULATORY_CARE_PROVIDER_SITE_OTHER): Payer: Medicare Other | Admitting: Family

## 2021-03-09 DIAGNOSIS — Z89432 Acquired absence of left foot: Secondary | ICD-10-CM

## 2021-03-09 DIAGNOSIS — L97521 Non-pressure chronic ulcer of other part of left foot limited to breakdown of skin: Secondary | ICD-10-CM

## 2021-03-10 ENCOUNTER — Other Ambulatory Visit: Payer: Self-pay | Admitting: Physical Medicine & Rehabilitation

## 2021-03-11 ENCOUNTER — Encounter: Payer: Self-pay | Admitting: Family

## 2021-03-11 NOTE — Progress Notes (Signed)
Office Visit Note   Patient: Tim [name]           Date of Birth: 10/10/1940           MRN: [id number] Visit Date: 03/09/2021              Requested by: Tammi Sou, MD [address] [place],  [place] [address] PCP: Tammi Sou, MD  Chief Complaint  Patient presents with   Left Leg - Follow-up       HPI: Patient is an 80 year old gentleman who presents today in follow-up.  He had been wrapped in a Dynaflex compression wrap on the left for some venous ulcers to his lower leg.  These are much improved.   He is also seen in follow-up for chronic ulceration to the end of his transmetatarsal amputation.  We have been debriding this every 3 weeks.   Assessment & Plan: Visit Diagnoses:  1. Non-pressure chronic ulcer of other part of left foot limited to breakdown of skin ([place])   2. History of transmetatarsal amputation of left foot ([place])      Plan: He will resume his routine wound care of the left, his compression garments as previously. Follow-Up Instructions: Return in about 3 weeks (around 03/30/2021).   Ortho Exam  [name], oriented, no adenopathy, well-dressed, normal affect, normal respiratory effort. Examination patient is ambulating in a wheelchair.  On examination of the left lower extremity there is good wrinkling of the skin From compression.  The venous ulcerations have fully epithelialized.  No erythema no weeping to the leg.  The left foot has a ulcer over the tip of the transmetatarsal amputation.  After informed consent a 10 blade knife was used to debride the skin and soft tissue back to healthy viable tissue. this did not probe to bone or tendon. no abscess.  Silver nitrate was used for hemostasis. the ulcer is stable at 3 cm in diameter and 2 mm deep after debridement.   Imaging: No results found. No images are attached to the encounter.  Labs: Lab Results  Component Value Date   HGBA1C 6.0 02/09/2021   HGBA1C 5.5 11/01/2020    HGBA1C 5.5 11/01/2020   HGBA1C 5.5 (A) 11/01/2020   HGBA1C 5.5 11/01/2020   ESRSEDRATE 34 (H) 11/24/2020   ESRSEDRATE 38 (H) 07/14/2020   ESRSEDRATE 20 02/09/2016   CRP 1.3 11/24/2020   CRP 0.9 07/14/2020   CRP 0.7 05/03/2020   LABURIC 3.5 (L) 09/10/2013   REPTSTATUS 05/04/2020 FINAL 04/28/2020   CULT  04/28/2020    NO GROWTH 5 DAYS Performed at [facility], [place] [address] [place], [place] [address]    LABORGA ENTEROCOCCUS FAECALIS 09/11/2019     Lab Results  Component Value Date   ALBUMIN 3.6 11/01/2020   ALBUMIN 3.0 (L) 05/07/2020   ALBUMIN 2.9 (L) 05/06/2020    Lab Results  Component Value Date   MG 1.7 05/07/2020   MG 1.6 (L) 05/06/2020   MG 1.8 05/05/2020   No results found for: VD25OH  No results found for: PREALBUMIN CBC EXTENDED Latest Ref Rng & Units 07/14/2020 05/07/2020 05/06/2020  WBC 3.8 - 10.8 Thousand/uL 7.2 5.5 5.6  RBC 4.20 - 5.80 Million/uL 4.11(L) 3.54(L) 3.41(L)  HGB 13.2 - 17.1 g/dL 12.2(L) 10.2(L) 10.1(L)  HCT 38.5 - 50.0 % 36.7(L) 32.1(L) 31.0(L)  PLT 140 - 400 Thousand/uL 121(L) 133(L) 129(L)  NEUTROABS 1,500 - 7,800 cells/uL 4,694 3.0 3.2  LYMPHSABS  850 - 3,900 cells/uL 1,390 1.6 1.6     There is no height or weight on file to calculate BMI.  Orders:  No orders of the defined types were placed in this encounter.  No orders of the defined types were placed in this encounter.    Procedures: No procedures performed  Clinical Data: No additional findings.  ROS:  All other systems negative, except as noted in the HPI. Review of Systems  Objective: Vital Signs: There were no vitals taken for this visit.  Specialty Comments:  No specialty comments available.  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   ICD (implantable cardioverter-defibrillator) infection ([place]) 12/22/2019   Gram-positive bacteremia 11/27/2019   FUO (fever of unknown origin) 11/26/2019   Low blood pressure reading 11/05/2019   Wound dehiscence 10/22/2019   PFO (patent foramen ovale) 10/06/2019   ICD (implantable cardioverter-defibrillator) battery depletion    Pressure injury of skin 09/13/2019   Enterococcal bacteremia history  09/13/2019   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   Urinary retention 09/13/2019   Phimosis 09/13/2019   SIRS (systemic inflammatory response syndrome) ([place]) 09/12/2019   Sepsis ([place]) 09/12/2019   Enterococcus UTI 09/12/2019   Cellulitis of foot    History of transmetatarsal amputation of left foot ([place])    Subacute osteomyelitis, left ankle and foot ([place])    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 ([place]) 04/17/2018   Diabetic neuropathy, painful ([place]) 12/26/2016   Neuropathic pain of foot, left 06/20/2016   Insulin resistance 07/22/2015   Status post amputation of left great toe ([place]) 06/08/2015   Spinal stenosis of lumbar region 02/16/2015   Hereditary and idiopathic peripheral neuropathy 01/12/2015   Oral thrush 12/28/2014   Orthostatic dizziness 05/24/2014   Paresthesias with subjective weakness 01/30/2014   Bilateral thigh pain 01/30/2014   Abdominal wall pain in right upper quadrant 01/22/2014   Gross hematuria 09/25/2013   Intertrigo 09/25/2013   IBS (irritable bowel syndrome) 09/11/2013   Chronic pain syndrome 08/06/2013   CAD (coronary artery disease), native coronary artery 12/25/2012   Paroxysmal atrial fibrillation ([place]) 12/25/2012   Moderate  aortic stenosis 12/25/2012   Aortic stenosis 12/24/2012   Personal history of colonic adenoma 10/30/2012   Diverticulosis of colon (without mention of hemorrhage) 10/30/2012   Internal hemorrhoids 10/30/2012   Change in bowel habits intermittent loose stools 10/30/2012   Routine health maintenance 06/09/2012   Hereditary peripheral neuropathy 10/10/2011   Long term current use of anticoagulant - apixiban 08/16/2010   Dyspnea on exertion 09/08/2009   HYPERCHOLESTEROLEMIA-PURE 01/13/2009   Essential hypertension, benign 01/13/2009   EDEMA 01/13/2009   GOUT 01/12/2009   GLAUCOMA 01/12/2009   Venous insufficiency (chronic) (peripheral) 01/12/2009   COPD (chronic obstructive pulmonary disease) ([place]) 01/12/2009   VENTRAL HERNIA 01/12/2009   OSTEOARTHRITIS 01/12/2009   ARTHRITIS 01/12/2009   DEGENERATIVE DISC DISEASE 01/12/2009   CATARACT, HX OF 01/12/2009   HEART MURMUR, HX OF 01/12/2009   BENIGN PROSTATIC HYPERTROPHY, HX OF 01/12/2009  Obesity 01/17/2007   OBSTRUCTIVE SLEEP APNEA 01/17/2007   ASTHMA 01/17/2007   GERD 01/17/2007   HALLUX VALGUS, ACQUIRED 01/17/2007   Pulmonary hypertension ([place]) 01/17/2007   Past Medical History:  Diagnosis Date   AICD (automatic cardioverter/defibrillator) present    Balanitis    +severe phimosis+ buried penis->circ not possible but dorsal slit done 10/2019   BPH (benign prostatic hypertrophy)    with urinary retention.  Renal u/s 12/04/19 NORMAL KIDNEYS, NORMAL BLADDER, NO HYDRONEPHROSIS   CAD (coronary artery disease)    Nonobstructive by 12/2012 cath; then 03/2016 he required BMS to RCA ([facility]).  In-stent restenosis on cath 11/01/16, baloon angioplasty successful.   CATARACT, HX OF    Chronic combined systolic and diastolic CHF (congestive heart failure) ([place]) 05/2016   Ischemic CM.  04/2018 EF 40-45%, grd I DD.   Chronic pain syndrome    Lumbar DDD; chronic neuropathic pain (DM); has spinal stimulator and sees pain mgmt MD   Complicated UTI  (urinary tract infection) 09/2019   Phimoses, acute urinary retention, entoerococcus UTI, enterococc bacteremia.  F/u blood clx's neg x 5d.     COPD    Diabetes mellitus type 2 with complications ([place])    HEN2D jumped from 5.7% to 6.1% 03/2015---started metformin at that time.  DM 2 dx by fasting gluc criteria 2018.  Has chronic neuropath pain   Enterococcal bacteremia 09/2019   Phimoses, acute urinary retention, entoerococcus UTI, enterococc bacteremia.  F/u blood clx's neg x 5d.     Essential hypertension, benign    Fatty liver 2007   2007 u/s showed fatty liver with hepatosplenomegaly.  2019 repeat u/s->fatty liver but no cirrhosis or hepatosplenomegaly.   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 Tommy Medal)   Lumbosacral neuritis    Lumbosacral spondylosis    Lumbar spinal stenosis with neurogenic claudication--contributes to his chronic pain syndrome   Normal memory function 08/2014   Neuropsychological testing ([facility]): no cognitive impairment or sign of neurodegenerative disorder.  Likely has adjustment d/o with mixed anxiety/depressed features and may benefit from low dose SNRI.     Normocytic anemia 03/2016   Mild-pt needs ferritin and vit B12 level checked (as of 03/22/16). Hb stable 09/2019   NSTEMI (non-ST elevated myocardial infarction) ([place]) 03/20/2016   BMS to RCA   Obesity hypoventilation syndrome ([facility])    Orthostatic hypotension    OSA on CPAP    8 cm H2O   OSTEOARTHRITIS    PAF (paroxysmal atrial fibrillation) ([facility])    Paroxysmal atrial fibrillation ([place]) 2003    (? chronic?) Off anticoag for a while due to falls.  Then  apixaban started 12/2014.   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 Ella Bodo): neuropathic pain->diff to treat, failed nucynta, failed spinal stimul trial, oxycontin hs + tramadol + gabap as of 12/2017 f/u Dr. Letta Pate.   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   Sick sinus syndrome ([facility])    PPM placed   Thrombocytopenia ([place]) 2018   HSM on 2007 abd u/s---suspect some mild splenic sequestration chronically.   Unspecified glaucoma(365.9)    Unspecified hereditary and idiopathic peripheral neuropathy approx age 26   bilat LE's, ? left arm, too.  Feet became progressively numb + left foot pain intermittently.  Pt may be trying a spinal stimulator (as of 05/2015)   VENOUS INSUFFICIENCY    Being followed by Dr. Sharol Given as of 10/2016 for two R LL venous stasis ulcers/skin tears.  Healed as of 10/30/16 f/u with Dr. Sharol Given.   VENTRAL HERNIA    Weakness 02/23/2021   Wound dehiscence 10/22/2019    Family History  Problem Relation Age of Onset   Hypertension Mother    Coronary artery disease Mother    Heart attack Mother    Neuropathy Mother    Pulmonary fibrosis Father        asbestosis    Past Surgical History:  Procedure Laterality Date   AMPUTATION Left 04/11/2013    Procedure: AMPUTATION DIGIT Left 3rd toe;  Surgeon: [name], MD;  Location: [facility];  Service: Orthopedics;  Laterality: Left;  Left 3rd toe amputation at [facility]   AMPUTATION Left 08/29/2019   Procedure: LEFT TRANSMETATARSAL AMPUTATION;  Surgeon: [name], MD;  Location: [facility];  Service: Orthopedics;  Laterality: Left;   BIOPSY  05/04/2020   Procedure: BIOPSY;  Surgeon: Jackquline Denmark, MD;  Location: [facility] ENDOSCOPY;  Service: Endoscopy;;   [place]; 03/10/16   1997 Non-obstructive disease.  03/2016 BMS to RCA, with 25% pDiag dz, o/w normal cors per cath 03/07/16.  Cath 11/01/16: in stent restenosis, successful baloon angioplasty. 08/2020 branch vessel dz, cont medical therapy rec'd.   CARDIAC CATHETERIZATION  12/24/2012   mild < 20% LCx, prox 30% RCA; LVEF 55-65% , moderate pulmonary HTN, moderate AS   CARDIAC DEFIBRILLATOR PLACEMENT  11/07/2017   [facility] MRI Quad CRT defibrillator   CARDIOVASCULAR STRESS TEST  05/11/16 ([facility])   2017 Myocardial perfusion imaging:  No ischemia; scar in apex, global hypokinesis, EF 36%.  06/15/20->mod primarily fixed inferolat wall defect mildly worse with stress c/w infarct/scar with mild peri-infarct ischemia, normal EF-->for cath per cards.   Carotid dopplers  03/09/2016   [facility]: no hemodynamically significant stenosis on either side.   CHOLECYSTECTOMY     COLONOSCOPY [name] 10/30/2012   Procedure: COLONOSCOPY;  Surgeon: [name], MD;  Location: [facility] ENDOSCOPY;  Service: Endoscopy;  Laterality: [name];   CORONARY ANGIOPLASTY WITH STENT PLACEMENT  03/2016; 04/2017   2017-[facility]: BMS to RCA-pt was placed on Brilinta.  04/2017: DES to RCA.   DORSAL SLIT [name] 10/29/2019   for severe phimosis. Procedure: DORSAL SLIT;  Surgeon: Alexis Frock, MD;  Location: [facility] ORS;  Service: Urology;  Laterality: [name];  [place]  05/04/2020   Procedure: ESOPHAGEAL DILATION;  Surgeon: Jackquline Denmark, MD;  Location[facility] ENDOSCOPY;  Service: Endoscopy;;  ESOPHAGOGASTRODUODENOSCOPY (EGD) WITH PROPOFOL [name] 05/04/2020   Procedure: ESOPHAGOGASTRODUODENOSCOPY (EGD) WITH PROPOFOL;  Surgeon: Jackquline Denmark, MD;  Location: [facility] ENDOSCOPY;  Service: Endoscopy;  Laterality: [name];   EYE SURGERY Bilateral cataract   HEMORRHOID SURGERY     INTRAOCULAR LENS INSERTION Bilateral    KNEE SURGERY Right    LEFT AND RIGHT HEART CATHETERIZATION WITH CORONARY ANGIOGRAM [name] 12/24/2012   Procedure: LEFT AND RIGHT HEART CATHETERIZATION WITH CORONARY ANGIOGRAM;  Surgeon: [name], MD;  Location: [facility] CATH LAB;  Service: Cardiovascular;  Laterality: [name];   LEG SURGERY Bilateral    lenghtening    PACEMAKER PLACEMENT  04/13/2016   2nd deg HB after TAVR, pt had DC MDT PPM placed.   SHOULDER ARTHROSCOPY  08/30/2011   Procedure: ARTHROSCOPY SHOULDER;  Surgeon: [name], MD;  Location: [place];  Service: Orthopedics;  Laterality: Right;  Right Shoulder Arthroscopy, Debridement, and Decompression   SPINAL CORD STIMULATOR INSERTION [name] 09/10/2015   Procedure: LUMBAR SPINAL CORD STIMULATOR INSERTION;  Surgeon: [name], MD;  Location: [place] [facility] NEURO ORS;  Service: Neurosurgery;  Laterality: [name];   TEE WITHOUT CARDIOVERSION [name] 09/16/2019   Procedure: TRANSESOPHAGEAL ECHOCARDIOGRAM (TEE);  Surgeon: Skeet Latch, MD;  Location: [facility] ENDOSCOPY;  Service: Cardiovascular;  Laterality: [name];   TEE WITHOUT CARDIOVERSION [name] 12/02/2019   +vegetation on AVR and pacer lead in RV.  EF 55-60%, normal wall motion.  Valves function normal.  Procedure: TRANSESOPHAGEAL ECHOCARDIOGRAM (TEE);  Surgeon: Geralynn Rile, MD;  Location: [facility];  Service: Cardiovascular;  Laterality: [name];   TOE AMPUTATION Left    due to osteomyelitis.  R big toe surg due to osteoarth   TONSILLECTOMY     traeculectomy Left    eye   TRANSCATHETER AORTIC VALVE REPLACEMENT, TRANSFEMORAL  04/11/2016   TRANSESOPHAGEAL ECHOCARDIOGRAM  03/09/2016; 09/2019   [facility]: EF 55-60%, PFO seen with bi-directional  shunting, no thrombus in appendage.  09/2019 ->no valvular vegetations. Small patent foramen ovale with predominantly left to right shunting across the interatrial septum.   TRANSTHORACIC ECHOCARDIOGRAM  01/2015; 01/2016; 05/18/16; 09/18/16, 05/2017, 08/2017   01/2015 No signif change in aortic stenosis (moderate).  01/2016 Severe LVH w/small LV cavity, EF 60-65%, grade I diast dysfxn.  05/2016 (s/p TAVR): EF 50-55%, grd I DD, biopros AV good.  08/2016--EF 50-55%, LV septal motion c/w conduction abnl, grd I DD,mild MS,bioprosth aortic valve well seated, w/trace AR. 05/2017 TTE EF 35%. 08/2017-EF 35%, mod diff hypokin LV, grd I DD, biopros AV good.    TRANSTHORACIC ECHOCARDIOGRAM  04/2018; 09/2019   04/2018: EF 40-45%, mod diffuse LV hypokinesis, grd I DD, bioprosth AV well seated, no AS or AR. 09/2019 EF 60-65%, grd I DD, valves fine, including bioprosth AV. 04/29/20 (tech diff) EF 55-60%, grd I DD, vegetation on MV.   VITRECTOMY     Social History   Occupational History   Occupation: Engineer, production    Comment: retired  Tobacco Use   Smoking status: Never   Smokeless tobacco: Never  Vaping Use   Vaping Use: Never used  Substance and Sexual Activity   Alcohol use: No    Alcohol/week: 0.0 standard drinks   Drug use: No    Types: Oxycodone   Sexual activity: Not Currently

## 2021-03-21 ENCOUNTER — Telehealth: Payer: Self-pay

## 2021-03-21 ENCOUNTER — Other Ambulatory Visit: Payer: Self-pay | Admitting: Family Medicine

## 2021-03-21 NOTE — Telephone Encounter (Signed)
LVM for pt to CB and schedule Medicare Annual Wellness Visit (AWV).

## 2021-03-25 ENCOUNTER — Other Ambulatory Visit: Payer: Self-pay

## 2021-03-25 MED ORDER — GABAPENTIN 800 MG PO TABS: 800.0000 mg | ORAL_TABLET | Freq: Four times a day (QID) | ORAL | 3 refills | Status: DC

## 2021-03-25 NOTE — Telephone Encounter (Signed)
Pt requested  gabapentin rx from original prescriber, Alysia Penna but "Request refused: Patient needs an appointment (make an appointment [phone] or try PCP)"  RF request for gabapentin LOV: 02/09/21 Next ov: 05/11/21 Last written: 01/14/20(360,2)   Please review and advise, med pending

## 2021-03-30 ENCOUNTER — Ambulatory Visit: Payer: Medicare Other | Admitting: Family

## 2021-03-31 ENCOUNTER — Ambulatory Visit (INDEPENDENT_AMBULATORY_CARE_PROVIDER_SITE_OTHER): Payer: Medicare Other | Admitting: Orthopedic Surgery

## 2021-03-31 DIAGNOSIS — Z89432 Acquired absence of left foot: Secondary | ICD-10-CM

## 2021-03-31 DIAGNOSIS — L97521 Non-pressure chronic ulcer of other part of left foot limited to breakdown of skin: Secondary | ICD-10-CM

## 2021-04-08 ENCOUNTER — Other Ambulatory Visit: Payer: Self-pay | Admitting: Family Medicine

## 2021-04-21 ENCOUNTER — Ambulatory Visit: Payer: Self-pay

## 2021-04-21 ENCOUNTER — Ambulatory Visit (INDEPENDENT_AMBULATORY_CARE_PROVIDER_SITE_OTHER): Payer: Medicare Other | Admitting: Orthopedic Surgery

## 2021-04-21 DIAGNOSIS — L97521 Non-pressure chronic ulcer of other part of left foot limited to breakdown of skin: Secondary | ICD-10-CM

## 2021-04-21 DIAGNOSIS — M86272 Subacute osteomyelitis, left ankle and foot: Secondary | ICD-10-CM

## 2021-04-21 NOTE — Progress Notes (Signed)
Xr fool

## 2021-04-24 ENCOUNTER — Encounter: Payer: Self-pay | Admitting: Orthopedic Surgery

## 2021-04-24 NOTE — Progress Notes (Signed)
Office Visit Note   Patient: Tim [name]           Date of Birth: [birth year]           MRN: [id number] Visit Date: 04/21/2021              Requested by: Tammi Sou, MD [address] [place],  [place] [address] PCP: Tammi Sou, MD  Chief Complaint  Patient presents with   Left Leg - Wound Check   Left Foot - Wound Check      HPI: Patient is an 80 year old gentleman who is status post transmetatarsal amputation of the left foot.  Patient has had hypergranulation tissue.  Patient is currently on amoxicillin.  He is proceeding with Iodosorb dressing changes with a compression sock.  Assessment & Plan: Visit Diagnoses:  1. Subacute osteomyelitis of left foot ([place])   2. Non-pressure chronic ulcer of other part of left foot limited to breakdown of skin ([place])     Plan: Continue current wound care.  Follow-Up Instructions: Return in about 3 weeks (around 05/12/2021).   Ortho Exam  [name], oriented, no adenopathy, well-dressed, normal affect, normal respiratory effort. Examination patient has recurrent hypergranulation tissue over the residual foot.  After informed consent a 10 blade knife was used to debride the hypergranulation tissue back to bleeding viable granulation tissue.  This does not extend down to bone or tendon there is no tunneling.  There is no cellulitis.  After debridement the ulcer is 3 cm in diameter and 3 mm deep.  This was touched with silver nitrate and an Iodosorb dressing was applied.  Imaging: No results found. No images are attached to the encounter.  Labs: Lab Results  Component Value Date   HGBA1C 6.0 02/09/2021   HGBA1C 5.5 11/01/2020   HGBA1C 5.5 11/01/2020   HGBA1C 5.5 (A) 11/01/2020   HGBA1C 5.5 11/01/2020   ESRSEDRATE 34 (H) 11/24/2020   ESRSEDRATE 38 (H) 07/14/2020   ESRSEDRATE 20 02/09/2016   CRP 1.3 11/24/2020   CRP 0.9 07/14/2020   CRP 0.7 05/03/2020   LABURIC 3.5 (L) 09/10/2013   REPTSTATUS 05/04/2020  FINAL 04/28/2020   CULT  04/28/2020    NO GROWTH 5 DAYS Performed at [facility], [place] [address], [place], [place] [address]    LABORGA ENTEROCOCCUS FAECALIS 09/11/2019     Lab Results  Component Value Date   ALBUMIN 3.6 11/01/2020   ALBUMIN 3.0 (L) 05/07/2020   ALBUMIN 2.9 (L) 05/06/2020    Lab Results  Component Value Date   MG 1.7 05/07/2020   MG 1.6 (L) 05/06/2020   MG 1.8 05/05/2020   No results found for: VD25OH  No results found for: PREALBUMIN CBC EXTENDED Latest Ref Rng & Units 07/14/2020 05/07/2020 05/06/2020  WBC 3.8 - 10.8 Thousand/uL 7.2 5.5 5.6  RBC 4.20 - 5.80 Million/uL 4.11(L) 3.54(L) 3.41(L)  HGB 13.2 - 17.1 g/dL 12.2(L) 10.2(L) 10.1(L)  HCT 38.5 - 50.0 % 36.7(L) 32.1(L) 31.0(L)  PLT 140 - 400 Thousand/uL 121(L) 133(L) 129(L)  NEUTROABS 1,500 - 7,800 cells/uL 4,694 3.0 3.2  LYMPHSABS 850 - 3,900 cells/uL 1,390 1.6 1.6     There is no height or weight on file to calculate BMI.  Orders:  Orders Placed This Encounter  Procedures   XR Foot Complete Left   No orders of the defined types were placed in this encounter.    Procedures: No procedures performed  Clinical Data: No additional  findings.  ROS:  All other systems negative, except as noted in the HPI. Review of Systems  Objective: Vital Signs: There were no vitals taken for this visit.  Specialty Comments:  No specialty comments available.  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   ICD (implantable cardioverter-defibrillator) infection ([place]) 12/22/2019    Gram-positive bacteremia 11/27/2019   FUO (fever of unknown origin) 11/26/2019   Low blood pressure reading 11/05/2019   Wound dehiscence 10/22/2019   PFO (patent foramen ovale) 10/06/2019   ICD (implantable cardioverter-defibrillator) battery depletion    Pressure injury of skin 09/13/2019   Enterococcal bacteremia history  09/13/2019   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   Urinary retention 09/13/2019   Phimosis 09/13/2019   SIRS (systemic inflammatory response syndrome) ([place]) 09/12/2019   Sepsis ([place]) 09/12/2019   Enterococcus UTI 09/12/2019   Cellulitis of foot    History of transmetatarsal amputation of left foot (HCC)    Subacute osteomyelitis, left ankle and foot ([place])    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 ([place]) 04/17/2018   Diabetic neuropathy, painful ([place]) 12/26/2016   Neuropathic pain of foot, left 06/20/2016   Insulin resistance 07/22/2015   Status post amputation of left great toe ([place]) 06/08/2015   Spinal stenosis of lumbar region 02/16/2015   Hereditary and idiopathic peripheral neuropathy 01/12/2015   Oral thrush 12/28/2014   Orthostatic dizziness 05/24/2014   Paresthesias with subjective weakness 01/30/2014   Bilateral thigh pain 01/30/2014   Abdominal wall pain in right upper quadrant 01/22/2014   Gross hematuria 09/25/2013   Intertrigo 09/25/2013   IBS (irritable bowel syndrome) 09/11/2013   Chronic pain syndrome 08/06/2013   CAD (coronary artery disease), native coronary artery 12/25/2012   Paroxysmal atrial fibrillation ([place]) 12/25/2012   Moderate aortic stenosis 12/25/2012   Aortic stenosis 12/24/2012   Personal history of colonic adenoma 10/30/2012   Diverticulosis of colon (without mention of hemorrhage) 10/30/2012   Internal hemorrhoids 10/30/2012   Change in bowel habits intermittent loose stools  10/30/2012   Routine health maintenance 06/09/2012   Hereditary peripheral neuropathy 10/10/2011   Long term current use of anticoagulant - apixiban 08/16/2010   Dyspnea on exertion 09/08/2009   HYPERCHOLESTEROLEMIA-PURE 01/13/2009   Essential hypertension, benign 01/13/2009   EDEMA 01/13/2009   GOUT 01/12/2009   GLAUCOMA 01/12/2009   Venous insufficiency (chronic) (peripheral) 01/12/2009   COPD (chronic obstructive pulmonary disease) ([place]) 01/12/2009   VENTRAL HERNIA 01/12/2009   OSTEOARTHRITIS 01/12/2009   ARTHRITIS 01/12/2009   DEGENERATIVE DISC DISEASE 01/12/2009   CATARACT, HX OF 01/12/2009   HEART MURMUR, HX OF 01/12/2009   BENIGN PROSTATIC HYPERTROPHY, HX OF 01/12/2009   Obesity 01/17/2007   OBSTRUCTIVE SLEEP APNEA 01/17/2007   ASTHMA 01/17/2007   GERD 01/17/2007   HALLUX VALGUS, ACQUIRED 01/17/2007   Pulmonary hypertension ([place]) 01/17/2007   Past Medical History:  Diagnosis Date   AICD (automatic cardioverter/defibrillator) present    Balanitis    +severe phimosis+ buried penis->circ not possible but dorsal slit  done 10/2019   BPH (benign prostatic hypertrophy)    with urinary retention.  Renal u/s 12/04/19 NORMAL KIDNEYS, NORMAL BLADDER, NO HYDRONEPHROSIS   CAD (coronary artery disease)    Nonobstructive by 12/2012 cath; then 03/2016 he required BMS to RCA ([facility]).  In-stent restenosis on cath 11/01/16, baloon angioplasty successful.   CATARACT, HX OF    Chronic combined systolic and diastolic CHF (congestive heart failure) ([place]) 05/2016   Ischemic CM.  04/2018 EF 40-45%, grd I DD.   Chronic pain syndrome    Lumbar DDD; chronic neuropathic pain (DM); has spinal stimulator and sees pain mgmt MD   Complicated UTI (urinary tract infection) 09/2019   Phimoses, acute urinary retention, entoerococcus UTI, enterococc bacteremia.  F/u blood clx's neg x 5d.     COPD    Diabetes mellitus type 2 with complications ([place])    LHT3S jumped from 5.7% to 6.1% 03/2015---started metformin  at that time.  DM 2 dx by fasting gluc criteria 2018.  Has chronic neuropath pain   Enterococcal bacteremia 09/2019   Phimoses, acute urinary retention, entoerococcus UTI, enterococc bacteremia.  F/u blood clx's neg x 5d.     Essential hypertension, benign    Fatty liver 2007   2007 u/s showed fatty liver with hepatosplenomegaly.  2019 repeat u/s->fatty liver but no cirrhosis or hepatosplenomegaly.   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 Tommy Medal)   Lumbosacral neuritis    Lumbosacral spondylosis    Lumbar spinal stenosis with neurogenic claudication--contributes to his chronic pain syndrome   Normal memory function 08/2014   Neuropsychological testing ([facility]): no cognitive impairment or sign of neurodegenerative disorder.  Likely has adjustment d/o with mixed anxiety/depressed features and may benefit from low dose SNRI.     Normocytic anemia 03/2016   Mild-pt needs ferritin and vit B12 level checked (as of 03/22/16). Hb stable 09/2019   NSTEMI (non-ST elevated myocardial infarction) ([place]) 03/20/2016   BMS to RCA   Obesity hypoventilation syndrome (HCC)    Orthostatic hypotension    OSA on CPAP    8 cm H2O   OSTEOARTHRITIS    PAF (paroxysmal atrial fibrillation) (HCC)    Paroxysmal atrial fibrillation ([place]) 2003    (? chronic?) Off anticoag for a while due to falls.  Then apixaban started 12/2014.   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 Ella Bodo): neuropathic pain->diff to treat, failed nucynta, failed spinal stimul trial, oxycontin hs + tramadol + gabap as of 12/2017 f/u Dr. Letta Pate.    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   Sick sinus syndrome (HCC)    PPM placed   Thrombocytopenia ([place]) 2018   HSM on 2007 abd u/s---suspect some mild splenic sequestration chronically.   Unspecified glaucoma(365.9)    Unspecified hereditary and idiopathic peripheral neuropathy approx age 39   bilat LE's, ? left arm, too.  Feet became progressively numb + left foot pain intermittently.  Pt may be trying a spinal stimulator (as of 05/2015)   VENOUS INSUFFICIENCY    Being followed by Dr. Sharol Given as of 10/2016 for two R LL venous stasis ulcers/skin tears.  Healed as of 10/30/16 f/u with Dr. Sharol Given.   VENTRAL HERNIA    Weakness 02/23/2021   Wound dehiscence 10/22/2019    Family History  Problem Relation Age of Onset   Hypertension Mother    Coronary artery disease Mother    Heart attack Mother    Neuropathy Mother    Pulmonary fibrosis Father        asbestosis    Past Surgical History:  Procedure Laterality Date   AMPUTATION Left 04/11/2013   Procedure: AMPUTATION DIGIT Left 3rd toe;  Surgeon: [name], MD;  Location: [facility];  Service: Orthopedics;  Laterality: Left;  Left 3rd toe amputation at [facility]   AMPUTATION Left 08/29/2019   Procedure: LEFT TRANSMETATARSAL AMPUTATION;  Surgeon: [name], MD;  Location: [facility];  Service: Orthopedics;  Laterality: Left;   BIOPSY  05/04/2020   Procedure: BIOPSY;  Surgeon: Jackquline Denmark, MD;  Location: [facility] ENDOSCOPY;  Service: Endoscopy;;   [place]; 03/10/16   1997 Non-obstructive disease.  03/2016 BMS to RCA, with 25% pDiag dz, o/w normal cors per cath 03/07/16.  Cath 11/01/16: in stent restenosis, successful baloon angioplasty. 08/2020 branch vessel dz, cont medical therapy rec'd.   CARDIAC CATHETERIZATION  12/24/2012   mild < 20% LCx, prox 30% RCA; LVEF 55-65% , moderate pulmonary HTN, moderate AS   CARDIAC DEFIBRILLATOR PLACEMENT  11/07/2017   [facility] MRI Quad CRT defibrillator   CARDIOVASCULAR STRESS TEST  05/11/16 ([facility])   2017 Myocardial perfusion imaging:  No ischemia; scar in apex, global hypokinesis, EF 36%.  06/15/20->mod primarily fixed inferolat wall defect mildly worse with stress c/w infarct/scar with mild peri-infarct ischemia, normal EF-->for cath per cards.   Carotid dopplers  03/09/2016   [facility]: no hemodynamically significant stenosis on either side.   CHOLECYSTECTOMY     COLONOSCOPY [name] 10/30/2012   Procedure: COLONOSCOPY;  Surgeon: [name], MD;  Location: [facility] ENDOSCOPY;  Service: Endoscopy;  Laterality: [name];   CORONARY ANGIOPLASTY WITH STENT PLACEMENT  03/2016; 04/2017   2017-[facility]: BMS to RCA-pt was placed on Brilinta.  04/2017: DES to RCA.   DORSAL SLIT [name] 10/29/2019   for severe phimosis. Procedure: DORSAL SLIT;  Surgeon: Alexis Frock, MD;  Location: [facility] ORS;  Service: Urology;  Laterality: [name];  [place]  05/04/2020   Procedure: ESOPHAGEAL DILATION;  Surgeon: Jackquline Denmark, MD;  Location: [facility] ENDOSCOPY;  Service: Endoscopy;;   ESOPHAGOGASTRODUODENOSCOPY (EGD) WITH PROPOFOL [name] 05/04/2020   Procedure: ESOPHAGOGASTRODUODENOSCOPY (EGD) WITH PROPOFOL;  Surgeon: Jackquline Denmark, MD;  Location: [facility] ENDOSCOPY;  Service: Endoscopy;  Laterality: [name];   EYE SURGERY Bilateral cataract    HEMORRHOID SURGERY     INTRAOCULAR LENS INSERTION Bilateral    KNEE SURGERY Right    LEFT  AND RIGHT HEART CATHETERIZATION WITH CORONARY ANGIOGRAM [name] 12/24/2012   Procedure: LEFT AND RIGHT HEART CATHETERIZATION WITH CORONARY ANGIOGRAM;  Surgeon: [name], MD;  Location: [facility] CATH LAB;  Service: Cardiovascular;  Laterality: [name];   LEG SURGERY Bilateral    lenghtening    PACEMAKER PLACEMENT  04/13/2016   2nd deg HB after TAVR, pt had DC MDT PPM placed.   SHOULDER ARTHROSCOPY  08/30/2011   Procedure: ARTHROSCOPY SHOULDER;  Surgeon: [name], MD;  Location: [facility];  Service: Orthopedics;  Laterality: Right;  Right Shoulder Arthroscopy, Debridement, and Decompression   SPINAL CORD STIMULATOR INSERTION [name] 09/10/2015   Procedure: LUMBAR SPINAL CORD STIMULATOR INSERTION;  Surgeon: [name], MD;  Location: [facility] NEURO ORS;  Service: Neurosurgery;  Laterality: [name];   TEE WITHOUT CARDIOVERSION [name] 09/16/2019   Procedure: TRANSESOPHAGEAL ECHOCARDIOGRAM (TEE);  Surgeon: Skeet Latch, MD;  Location[name] ENDOSCOPY;  Service: Cardiovascular;  Laterality: [name];   TEE WITHOUT CARDIOVERSION [name] 12/02/2019   +vegetation on AVR and pacer lead in RV.  EF 55-60%, normal wall motion.  Valves function normal.  Procedure: TRANSESOPHAGEAL ECHOCARDIOGRAM (TEE);  Surgeon: Geralynn Rile, MD;  Location: [facility];  Service: Cardiovascular;  Laterality: [name];   TOE AMPUTATION Left    due to osteomyelitis.  R big toe surg due to osteoarth   TONSILLECTOMY     traeculectomy Left    eye   TRANSCATHETER AORTIC VALVE REPLACEMENT, TRANSFEMORAL  04/11/2016   TRANSESOPHAGEAL ECHOCARDIOGRAM  03/09/2016; 09/2019   [facility]: EF 55-60%, PFO seen with bi-directional shunting, no thrombus in appendage.  09/2019 ->no valvular vegetations. Small patent foramen ovale with predominantly left to right shunting across the interatrial septum.   TRANSTHORACIC ECHOCARDIOGRAM  01/2015; 01/2016; 05/18/16; 09/18/16, 05/2017, 08/2017    01/2015 No signif change in aortic stenosis (moderate).  01/2016 Severe LVH w/small LV cavity, EF 60-65%, grade I diast dysfxn.  05/2016 (s/p TAVR): EF 50-55%, grd I DD, biopros AV good.  08/2016--EF 50-55%, LV septal motion c/w conduction abnl, grd I DD,mild MS,bioprosth aortic valve well seated, w/trace AR. 05/2017 TTE EF 35%. 08/2017-EF 35%, mod diff hypokin LV, grd I DD, biopros AV good.    TRANSTHORACIC ECHOCARDIOGRAM  04/2018; 09/2019   04/2018: EF 40-45%, mod diffuse LV hypokinesis, grd I DD, bioprosth AV well seated, no AS or AR. 09/2019 EF 60-65%, grd I DD, valves fine, including bioprosth AV. 04/29/20 (tech diff) EF 55-60%, grd I DD, vegetation on MV.   VITRECTOMY     Social History   Occupational History   Occupation: Engineer, production    Comment: retired  Tobacco Use   Smoking status: Never   Smokeless tobacco: Never  Vaping Use   Vaping Use: Never used  Substance and Sexual Activity   Alcohol use: No    Alcohol/week: 0.0 standard drinks   Drug use: No    Types: Oxycodone   Sexual activity: Not Currently

## 2021-05-01 ENCOUNTER — Encounter: Payer: Self-pay | Admitting: Orthopedic Surgery

## 2021-05-01 NOTE — Progress Notes (Signed)
Office Visit Note   Patient: Tim [name]           Date of Birth: 30-Oct-1940           MRN: [id number] Visit Date: 03/31/2021              Requested by: Tammi Sou, MD [address] [place],  [place] [address] PCP: Tammi Sou, MD  Chief Complaint  Patient presents with   Left Foot - Follow-up      HPI: Patient is an 80 year old gentleman status post left transmetatarsal amputation who has had persistent hypergranulation tissue over the residual limb.  Assessment & Plan: Visit Diagnoses:  1. Non-pressure chronic ulcer of other part of left foot limited to breakdown of skin ([place])   2. History of transmetatarsal amputation of left foot ([place])     Plan: Ulcer was debrided Iodosorb dressing applied.  Continue with elevation compression and minimize weightbearing.  Follow-Up Instructions: Return in about 3 weeks (around 04/21/2021).   Ortho Exam  [name], oriented, no adenopathy, well-dressed, normal affect, normal respiratory effort. Examination patient has hypergranulation tissue over the residual limb.  This measures 2 cm in diameter there is no ascending cellulitis there is brawny skin color changes with lymphatic and venous insufficiency.  After informed consent a 10 blade knife was used to debride the skin and soft tissue back to healthy viable bleeding granulation tissue this was touched with silver nitrate after debridement the ulcer is 3 cm in diameter 5 mm deep.  Iodosorb dressing applied.  Imaging: No results found. No images are attached to the encounter.  Labs: Lab Results  Component Value Date   HGBA1C 6.0 02/09/2021   HGBA1C 5.5 11/01/2020   HGBA1C 5.5 11/01/2020   HGBA1C 5.5 (A) 11/01/2020   HGBA1C 5.5 11/01/2020   ESRSEDRATE 34 (H) 11/24/2020   ESRSEDRATE 38 (H) 07/14/2020   ESRSEDRATE 20 02/09/2016   CRP 1.3 11/24/2020   CRP 0.9 07/14/2020   CRP 0.7 05/03/2020   LABURIC 3.5 (L) 09/10/2013   REPTSTATUS 05/04/2020 FINAL  04/28/2020   CULT  04/28/2020    NO GROWTH 5 DAYS Performed at [facility], [place] [address] [place], [place] [address]    LABORGA ENTEROCOCCUS FAECALIS 09/11/2019     Lab Results  Component Value Date   ALBUMIN 3.6 11/01/2020   ALBUMIN 3.0 (L) 05/07/2020   ALBUMIN 2.9 (L) 05/06/2020    Lab Results  Component Value Date   MG 1.7 05/07/2020   MG 1.6 (L) 05/06/2020   MG 1.8 05/05/2020   No results found for: VD25OH  No results found for: PREALBUMIN CBC EXTENDED Latest Ref Rng & Units 07/14/2020 05/07/2020 05/06/2020  WBC 3.8 - 10.8 Thousand/uL 7.2 5.5 5.6  RBC 4.20 - 5.80 Million/uL 4.11(L) 3.54(L) 3.41(L)  HGB 13.2 - 17.1 g/dL 12.2(L) 10.2(L) 10.1(L)  HCT 38.5 - 50.0 % 36.7(L) 32.1(L) 31.0(L)  PLT 140 - 400 Thousand/uL 121(L) 133(L) 129(L)  NEUTROABS 1,500 - 7,800 cells/uL 4,694 3.0 3.2  LYMPHSABS 850 - 3,900 cells/uL 1,390 1.6 1.6     There is no height or weight on file to calculate BMI.  Orders:  No orders of the defined types were placed in this encounter.  No orders of the defined types were placed in this encounter.    Procedures: No procedures performed  Clinical Data: No additional findings.  ROS:  All other systems negative, except as noted in the HPI. Review of  Systems  Objective: Vital Signs: There were no vitals taken for this visit.  Specialty Comments:  No specialty comments available.  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   ICD (implantable cardioverter-defibrillator) infection ([place]) 12/22/2019   Gram-positive  bacteremia 11/27/2019   FUO (fever of unknown origin) 11/26/2019   Low blood pressure reading 11/05/2019   Wound dehiscence 10/22/2019   PFO (patent foramen ovale) 10/06/2019   ICD (implantable cardioverter-defibrillator) battery depletion    Pressure injury of skin 09/13/2019   Enterococcal bacteremia history  09/13/2019   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   Urinary retention 09/13/2019   Phimosis 09/13/2019   SIRS (systemic inflammatory response syndrome) ([place]) 09/12/2019   Sepsis ([place]) 09/12/2019   Enterococcus UTI 09/12/2019   Cellulitis of foot    History of transmetatarsal amputation of left foot (H[facility])    Subacute osteomyelitis, left ankle and foot ([place])    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 ([place]) 04/17/2018   Diabetic neuropathy, painful ([place]) 12/26/2016   Neuropathic pain of foot, left 06/20/2016   Insulin resistance 07/22/2015   Status post amputation of left great toe ([place]) 06/08/2015   Spinal stenosis of lumbar region 02/16/2015   Hereditary and idiopathic peripheral neuropathy 01/12/2015   Oral thrush 12/28/2014   Orthostatic dizziness 05/24/2014   Paresthesias with subjective weakness 01/30/2014   Bilateral thigh pain 01/30/2014   Abdominal wall pain in right upper quadrant 01/22/2014   Gross hematuria 09/25/2013   Intertrigo 09/25/2013   IBS (irritable bowel syndrome) 09/11/2013   Chronic pain syndrome 08/06/2013   CAD (coronary artery disease), native coronary artery 12/25/2012   Paroxysmal atrial fibrillation ([place]) 12/25/2012   Moderate aortic stenosis 12/25/2012   Aortic stenosis 12/24/2012   Personal history of colonic adenoma 10/30/2012   Diverticulosis of colon (without mention of hemorrhage) 10/30/2012   Internal hemorrhoids 10/30/2012   Change in bowel habits intermittent loose stools 10/30/2012   Routine  health maintenance 06/09/2012   Hereditary peripheral neuropathy 10/10/2011   Long term current use of anticoagulant - apixiban 08/16/2010   Dyspnea on exertion 09/08/2009   HYPERCHOLESTEROLEMIA-PURE 01/13/2009   Essential hypertension, benign 01/13/2009   EDEMA 01/13/2009   GOUT 01/12/2009   GLAUCOMA 01/12/2009   Venous insufficiency (chronic) (peripheral) 01/12/2009   COPD (chronic obstructive pulmonary disease) ([place]) 01/12/2009   VENTRAL HERNIA 01/12/2009   OSTEOARTHRITIS 01/12/2009   ARTHRITIS 01/12/2009   DEGENERATIVE DISC DISEASE 01/12/2009   CATARACT, HX OF 01/12/2009   HEART MURMUR, HX OF 01/12/2009   BENIGN PROSTATIC HYPERTROPHY, HX OF 01/12/2009   Obesity 01/17/2007   OBSTRUCTIVE SLEEP APNEA 01/17/2007   ASTHMA 01/17/2007   GERD 01/17/2007   HALLUX VALGUS, ACQUIRED 01/17/2007   Pulmonary hypertension ([place]) 01/17/2007   Past Medical History:  Diagnosis Date   AICD (automatic cardioverter/defibrillator) present    Balanitis    +severe phimosis+ buried penis->circ not possible but dorsal slit done 10/2019   BPH (benign prostatic hypertrophy)    with urinary retention.  Renal  u/s 12/04/19 NORMAL KIDNEYS, NORMAL BLADDER, NO HYDRONEPHROSIS   CAD (coronary artery disease)    Nonobstructive by 12/2012 cath; then 03/2016 he required BMS to RCA ([facility]).  In-stent restenosis on cath 11/01/16, baloon angioplasty successful.   CATARACT, HX OF    Chronic combined systolic and diastolic CHF (congestive heart failure) ([place]) 05/2016   Ischemic CM.  04/2018 EF 40-45%, grd I DD.   Chronic pain syndrome    Lumbar DDD; chronic neuropathic pain (DM); has spinal stimulator and sees pain mgmt MD   Complicated UTI (urinary tract infection) 09/2019   Phimoses, acute urinary retention, entoerococcus UTI, enterococc bacteremia.  F/u blood clx's neg x 5d.     COPD    Diabetes mellitus type 2 with complications ([place])    YFV4B jumped from 5.7% to 6.1% 03/2015---started metformin at that time.  DM 2  dx by fasting gluc criteria 2018.  Has chronic neuropath pain   Enterococcal bacteremia 09/2019   Phimoses, acute urinary retention, entoerococcus UTI, enterococc bacteremia.  F/u blood clx's neg x 5d.     Essential hypertension, benign    Fatty liver 2007   2007 u/s showed fatty liver with hepatosplenomegaly.  2019 repeat u/s->fatty liver but no cirrhosis or hepatosplenomegaly.   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 Tommy Medal)   Lumbosacral neuritis    Lumbosacral spondylosis    Lumbar spinal stenosis with neurogenic claudication--contributes to his chronic pain syndrome   Normal memory function 08/2014   Neuropsychological testing ([facility]): no cognitive impairment or sign of neurodegenerative disorder.  Likely has adjustment d/o with mixed anxiety/depressed features and may benefit from low dose SNRI.     Normocytic anemia 03/2016   Mild-pt needs ferritin and vit B12 level checked (as of 03/22/16). Hb stable 09/2019   NSTEMI (non-ST elevated myocardial infarction) ([place]) 03/20/2016   BMS to RCA   Obesity hypoventilation syndrome ([facility])    Orthostatic hypotension    OSA on CPAP    8 cm H2O   OSTEOARTHRITIS    PAF (paroxysmal atrial fibrillation) (HCC)    Paroxysmal atrial fibrillation ([place]) 2003    (? chronic?) Off anticoag for a while due to falls.  Then apixaban started 12/2014.   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 Ella Bodo): neuropathic pain->diff to treat, failed nucynta, failed spinal stimul trial, oxycontin hs + tramadol + gabap as of 12/2017 f/u Dr. Letta Pate.   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   Sick sinus syndrome (HCC)    PPM placed   Thrombocytopenia ([place]) 2018   HSM on 2007 abd u/s---suspect some mild splenic sequestration chronically.   Unspecified glaucoma(365.9)    Unspecified hereditary and idiopathic peripheral neuropathy approx age 61   bilat LE's, ? left arm, too.  Feet became progressively numb + left foot pain intermittently.  Pt may be trying a spinal stimulator (as of 05/2015)   VENOUS INSUFFICIENCY    Being followed by Dr. Sharol Given as of 10/2016 for two R LL venous stasis ulcers/skin tears.  Healed as of 10/30/16 f/u with Dr. Sharol Given.   VENTRAL HERNIA    Weakness 02/23/2021   Wound dehiscence 10/22/2019    Family History  Problem Relation Age of Onset   Hypertension Mother    Coronary artery disease Mother    Heart attack Mother    Neuropathy Mother    Pulmonary fibrosis Father        asbestosis    Past Surgical History:  Procedure Laterality Date   AMPUTATION Left 04/11/2013   Procedure: AMPUTATION DIGIT Left 3rd toe;  Surgeon: [name], MD;  Location: [place];  Service: Orthopedics;  Laterality: Left;  Left 3rd toe amputation at [facility]   AMPUTATION Left 08/29/2019   Procedure: LEFT TRANSMETATARSAL AMPUTATION;  Surgeon: [name], MD;  Location:  [place];  Service: Orthopedics;  Laterality: Left;   BIOPSY  05/04/2020   Procedure: BIOPSY;  Surgeon: Jackquline Denmark, MD;  Location: [name] ENDOSCOPY;  Service: Endoscopy;;   [place]; 03/10/16   1997 Non-obstructive disease.  03/2016 BMS to RCA, with 25% pDiag dz, o/w normal cors per cath 03/07/16.  Cath 11/01/16: in stent restenosis, successful baloon angioplasty. 08/2020 branch vessel dz, cont medical therapy rec'd.   CARDIAC CATHETERIZATION  12/24/2012   mild < 20% LCx, prox 30% RCA; LVEF 55-65% , moderate pulmonary HTN, moderate AS   CARDIAC DEFIBRILLATOR PLACEMENT  11/07/2017   [facility] MRI Quad CRT defibrillator   CARDIOVASCULAR STRESS TEST  05/11/16 ([facility])   2017 Myocardial perfusion imaging:  No ischemia; scar in apex, global hypokinesis, EF 36%.  06/15/20->mod primarily fixed inferolat wall defect mildly worse with stress c/w infarct/scar with mild peri-infarct ischemia, normal EF-->for cath per cards.   Carotid dopplers  03/09/2016   [facility]: no hemodynamically significant stenosis on either side.   CHOLECYSTECTOMY     COLONOSCOPY [name] 10/30/2012   Procedure: COLONOSCOPY;  Surgeon: [name], MD;  Location: [facility] ENDOSCOPY;  Service: Endoscopy;  Laterality: [name];   CORONARY ANGIOPLASTY WITH STENT PLACEMENT  03/2016; 04/2017   2017-[facility]: BMS to RCA-pt was placed on Brilinta.  04/2017: DES to RCA.   DORSAL SLIT [name] 10/29/2019   for severe phimosis. Procedure: DORSAL SLIT;  Surgeon: Alexis Frock, MD;  Location: [facility] ORS;  Service: Urology;  Laterality: [name];  [place]  05/04/2020   Procedure: ESOPHAGEAL DILATION;  Surgeon: Jackquline Denmark, MD;  Location: [facility]COPY;  Service: Endoscopy;;   ESOPHAGOGASTRODUODENOSCOPY (EGD) WITH PROPOFOL [name] 05/04/2020   Procedure: ESOPHAGOGASTRODUODENOSCOPY (EGD) WITH PROPOFOL;  Surgeon: Jackquline Denmark, MD;  Location: [name] ENDOSCOPY;  Service: Endoscopy;  Laterality: [name];   EYE SURGERY Bilateral cataract   HEMORRHOID SURGERY      INTRAOCULAR LENS INSERTION Bilateral    KNEE SURGERY Right    LEFT AND RIGHT HEART CATHETERIZATION WITH CORONARY ANGIOGRAM [name] 12/24/2012   Procedure: LEFT AND RIGHT HEART CATHETERIZATION  WITH CORONARY ANGIOGRAM;  Surgeon: [name], MD;  Location: [facility] CATH LAB;  Service: Cardiovascular;  Laterality: [name];   LEG SURGERY Bilateral    lenghtening    PACEMAKER PLACEMENT  04/13/2016   2nd deg HB after TAVR, pt had DC MDT PPM placed.   SHOULDER ARTHROSCOPY  08/30/2011   Procedure: ARTHROSCOPY SHOULDER;  Surgeon: [name], MD;  Location: [place];  Service: Orthopedics;  Laterality: Right;  Right Shoulder Arthroscopy, Debridement, and Decompression   SPINAL CORD STIMULATOR INSERTION [name] 09/10/2015   Procedure: LUMBAR SPINAL CORD STIMULATOR INSERTION;  Surgeon: [name], MD;  Location: [place] [facility]URO ORS;  Service: Neurosurgery;  Laterality: [name];   TEE WITHOUT CARDIOVERSION [name] 09/16/2019   Procedure: TRANSESOPHAGEAL ECHOCARDIOGRAM (TEE);  Surgeon: Skeet Latch, MD;  Location: [facility] ENDOSCOPY;  Service: Cardiovascular;  Laterality: [name];   TEE WITHOUT CARDIOVERSION [name] 12/02/2019   +vegetation on AVR and pacer lead in RV.  EF 55-60%, normal wall motion.  Valves function normal.  Procedure: TRANSESOPHAGEAL ECHOCARDIOGRAM (TEE);  Surgeon: Geralynn Rile, MD;  Location: [place];  Service: Cardiovascular;  Laterality: [name];   TOE AMPUTATION Left    due to osteomyelitis.  R big toe surg due to osteoarth   TONSILLECTOMY     traeculectomy Left    eye   TRANSCATHETER AORTIC VALVE REPLACEMENT, TRANSFEMORAL  04/11/2016   TRANSESOPHAGEAL ECHOCARDIOGRAM  03/09/2016; 09/2019   [facility]: EF 55-60%, PFO seen with bi-directional shunting, no thrombus in appendage.  09/2019 ->no valvular vegetations. Small patent foramen ovale with predominantly left to right shunting across the interatrial septum.   TRANSTHORACIC ECHOCARDIOGRAM  01/2015; 01/2016; 05/18/16; 09/18/16, 05/2017, 08/2017   01/2015 No signif change in  aortic stenosis (moderate).  01/2016 Severe LVH w/small LV cavity, EF 60-65%, grade I diast dysfxn.  05/2016 (s/p TAVR): EF 50-55%, grd I DD, biopros AV good.  08/2016--EF 50-55%, LV septal motion c/w conduction abnl, grd I DD,mild MS,bioprosth aortic valve well seated, w/trace AR. 05/2017 TTE EF 35%. 08/2017-EF 35%, mod diff hypokin LV, grd I DD, biopros AV good.    TRANSTHORACIC ECHOCARDIOGRAM  04/2018; 09/2019   04/2018: EF 40-45%, mod diffuse LV hypokinesis, grd I DD, bioprosth AV well seated, no AS or AR. 09/2019 EF 60-65%, grd I DD, valves fine, including bioprosth AV. 04/29/20 (tech diff) EF 55-60%, grd I DD, vegetation on MV.   VITRECTOMY     Social History   Occupational History   Occupation: Engineer, production    Comment: retired  Tobacco Use   Smoking status: Never   Smokeless tobacco: Never  Vaping Use   Vaping Use: Never used  Substance and Sexual Activity   Alcohol use: No    Alcohol/week: 0.0 standard drinks   Drug use: No    Types: Oxycodone   Sexual activity: Not Currently

## 2021-05-03 ENCOUNTER — Ambulatory Visit (INDEPENDENT_AMBULATORY_CARE_PROVIDER_SITE_OTHER): Payer: Medicare Other | Admitting: Orthopedic Surgery

## 2021-05-03 ENCOUNTER — Encounter: Payer: Self-pay | Admitting: Orthopedic Surgery

## 2021-05-03 DIAGNOSIS — L97521 Non-pressure chronic ulcer of other part of left foot limited to breakdown of skin: Secondary | ICD-10-CM

## 2021-05-03 DIAGNOSIS — Z89432 Acquired absence of left foot: Secondary | ICD-10-CM | POA: Diagnosis not present

## 2021-05-03 NOTE — Progress Notes (Signed)
Office Visit Note   Patient: Tim [name]           Date of Birth: 01/31/1941           MRN: [id number] Visit Date: 05/03/2021              Requested by: Tammi Sou, MD [address] [place],  [place] [address] PCP: Tammi Sou, MD  Chief Complaint  Patient presents with   Left Foot - Follow-up    08/19/19 left foot transmet amputation       HPI: Patient is an 80 year old gentleman who presents in follow-up for hypergranulation tissue wound over the end of the transmetatarsal amputation on the left.  Patient has been doing Iodosorb dressing changes and using a compression sock.  Assessment & Plan: Visit Diagnoses:  1. History of transmetatarsal amputation of left foot ([place])   2. Non-pressure chronic ulcer of other part of left foot limited to breakdown of skin ([place])     Plan: Patient will continue with current wound care he is showing slow improvement.  Follow-Up Instructions: Return in about 3 weeks (around 05/24/2021).   Ortho Exam  [name], oriented, no adenopathy, well-dressed, normal affect, normal respiratory effort. Examination the area of hypergranulation tissue is decreased in thickness there is no cellulitis no odor no drainage no signs of infection.  After informed consent a 10 blade knife was used to debride the hypergranulation tissue back to healthy viable bleeding granulation tissue.  This was touched with silver nitrate the wound was 3 cm in diameter after debridement and 2 mm deep.  Imaging: No results found. No images are attached to the encounter.  Labs: Lab Results  Component Value Date   HGBA1C 6.0 02/09/2021   HGBA1C 5.5 11/01/2020   HGBA1C 5.5 11/01/2020   HGBA1C 5.5 (A) 11/01/2020   HGBA1C 5.5 11/01/2020   ESRSEDRATE 34 (H) 11/24/2020   ESRSEDRATE 38 (H) 07/14/2020   ESRSEDRATE 20 02/09/2016   CRP 1.3 11/24/2020   CRP 0.9 07/14/2020   CRP 0.7 05/03/2020   LABURIC 3.5 (L) 09/10/2013   REPTSTATUS 05/04/2020  FINAL 04/28/2020   CULT  04/28/2020    NO GROWTH 5 DAYS Performed at [facility], [place] [address], [place],  [address]    LABORGA ENTEROCOCCUS FAECALIS 09/11/2019     Lab Results  Component Value Date   ALBUMIN 3.6 11/01/2020   ALBUMIN 3.0 (L) 05/07/2020   ALBUMIN 2.9 (L) 05/06/2020    Lab Results  Component Value Date   MG 1.7 05/07/2020   MG 1.6 (L) 05/06/2020   MG 1.8 05/05/2020   No results found for: VD25OH  No results found for: PREALBUMIN CBC EXTENDED Latest Ref Rng & Units 07/14/2020 05/07/2020 05/06/2020  WBC 3.8 - 10.8 Thousand/uL 7.2 5.5 5.6  RBC 4.20 - 5.80 Million/uL 4.11(L) 3.54(L) 3.41(L)  HGB 13.2 - 17.1 g/dL 12.2(L) 10.2(L) 10.1(L)  HCT 38.5 - 50.0 % 36.7(L) 32.1(L) 31.0(L)  PLT 140 - 400 Thousand/uL 121(L) 133(L) 129(L)  NEUTROABS 1,500 - 7,800 cells/uL 4,694 3.0 3.2  LYMPHSABS 850 - 3,900 cells/uL 1,390 1.6 1.6     There is no height or weight on file to calculate BMI.  Orders:  No orders of the defined types were placed in this encounter.  No orders of the defined types were placed in this encounter.    Procedures: No procedures performed  Clinical Data: No additional findings.  ROS:  All other systems  negative, except as noted in the HPI. Review of Systems  Objective: Vital Signs: There were no vitals taken for this visit.  Specialty Comments:  No specialty comments available.  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   ICD (implantable cardioverter-defibrillator) infection ([place]) 12/22/2019   Gram-positive  bacteremia 11/27/2019   FUO (fever of unknown origin) 11/26/2019   Low blood pressure reading 11/05/2019   Wound dehiscence 10/22/2019   PFO (patent foramen ovale) 10/06/2019   ICD (implantable cardioverter-defibrillator) battery depletion    Pressure injury of skin 09/13/2019   Enterococcal bacteremia history  09/13/2019   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   Urinary retention 09/13/2019   Phimosis 09/13/2019   SIRS (systemic inflammatory response syndrome) ([place]) 09/12/2019   Sepsis ([place]) 09/12/2019   Enterococcus UTI 09/12/2019   Cellulitis of foot    History of transmetatarsal amputation of left foot (HCC)    Subacute osteomyelitis, left ankle and foot ([place])    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 ([place]) 04/17/2018   Diabetic neuropathy, painful ([place]) 12/26/2016   Neuropathic pain of foot, left 06/20/2016   Insulin resistance 07/22/2015   Status post amputation of left great toe ([place]) 06/08/2015   Spinal stenosis of lumbar region 02/16/2015   Hereditary and idiopathic peripheral neuropathy 01/12/2015   Oral thrush 12/28/2014   Orthostatic dizziness 05/24/2014   Paresthesias with subjective weakness 01/30/2014   Bilateral thigh pain 01/30/2014   Abdominal wall pain in right upper quadrant 01/22/2014   Gross hematuria 09/25/2013   Intertrigo 09/25/2013   IBS (irritable bowel syndrome) 09/11/2013   Chronic pain syndrome 08/06/2013   CAD (coronary artery disease), native coronary artery 12/25/2012   Paroxysmal atrial fibrillation ([place]) 12/25/2012   Moderate aortic stenosis 12/25/2012   Aortic stenosis 12/24/2012   Personal history of colonic adenoma 10/30/2012   Diverticulosis of colon (without mention of hemorrhage) 10/30/2012   Internal hemorrhoids 10/30/2012   Change in bowel habits intermittent loose stools 10/30/2012   Routine  health maintenance 06/09/2012   Hereditary peripheral neuropathy 10/10/2011   Long term current use of anticoagulant - apixiban 08/16/2010   Dyspnea on exertion 09/08/2009   HYPERCHOLESTEROLEMIA-PURE 01/13/2009   Essential hypertension, benign 01/13/2009   EDEMA 01/13/2009   GOUT 01/12/2009   GLAUCOMA 01/12/2009   Venous insufficiency (chronic) (peripheral) 01/12/2009   COPD (chronic obstructive pulmonary disease) ([place]) 01/12/2009   VENTRAL HERNIA 01/12/2009   OSTEOARTHRITIS 01/12/2009   ARTHRITIS 01/12/2009   DEGENERATIVE DISC DISEASE 01/12/2009   CATARACT, HX OF 01/12/2009   HEART MURMUR, HX OF 01/12/2009   BENIGN PROSTATIC HYPERTROPHY, HX OF 01/12/2009   Obesity 01/17/2007   OBSTRUCTIVE SLEEP APNEA 01/17/2007   ASTHMA 01/17/2007   GERD 01/17/2007   HALLUX VALGUS, ACQUIRED 01/17/2007   Pulmonary hypertension ([place]) 01/17/2007   Past Medical History:  Diagnosis Date   AICD (automatic cardioverter/defibrillator) present    Balanitis    +severe phimosis+ buried penis->circ not possible but dorsal slit done 10/2019   BPH (benign prostatic  hypertrophy)    with urinary retention.  Renal u/s 12/04/19 NORMAL KIDNEYS, NORMAL BLADDER, NO HYDRONEPHROSIS   CAD (coronary artery disease)    Nonobstructive by 12/2012 cath; then 03/2016 he required BMS to RCA ([facility]).  In-stent restenosis on cath 11/01/16, baloon angioplasty successful.   CATARACT, HX OF    Chronic combined systolic and diastolic CHF (congestive heart failure) ([place]) 05/2016   Ischemic CM.  04/2018 EF 40-45%, grd I DD.   Chronic pain syndrome    Lumbar DDD; chronic neuropathic pain (DM); has spinal stimulator and sees pain mgmt MD   Complicated UTI (urinary tract infection) 09/2019   Phimoses, acute urinary retention, entoerococcus UTI, enterococc bacteremia.  F/u blood clx's neg x 5d.     COPD    Diabetes mellitus type 2 with complications ([place])    JAS5K jumped from 5.7% to 6.1% 03/2015---started metformin at that time.  DM 2  dx by fasting gluc criteria 2018.  Has chronic neuropath pain   Enterococcal bacteremia 09/2019   Phimoses, acute urinary retention, entoerococcus UTI, enterococc bacteremia.  F/u blood clx's neg x 5d.     Essential hypertension, benign    Fatty liver 2007   2007 u/s showed fatty liver with hepatosplenomegaly.  2019 repeat u/s->fatty liver but no cirrhosis or hepatosplenomegaly.   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 Tommy Medal)   Lumbosacral neuritis    Lumbosacral spondylosis    Lumbar spinal stenosis with neurogenic claudication--contributes to his chronic pain syndrome   Normal memory function 08/2014   Neuropsychological testing ([facility]): no cognitive impairment or sign of neurodegenerative disorder.  Likely has adjustment d/o with mixed anxiety/depressed features and may benefit from low dose SNRI.     Normocytic anemia 03/2016   Mild-pt needs ferritin and vit B12 level checked (as of 03/22/16). Hb stable 09/2019   NSTEMI (non-ST elevated myocardial infarction) ([place]) 03/20/2016   BMS to RCA   Obesity hypoventilation syndrome (HCC)    Orthostatic hypotension    OSA on CPAP    8 cm H2O   OSTEOARTHRITIS    PAF (paroxysmal atrial fibrillation) (HCC)    Paroxysmal atrial fibrillation ([place]) 2003    (? chronic?) Off anticoag for a while due to falls.  Then apixaban started 12/2014.   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 Ella Bodo): neuropathic pain->diff to treat, failed nucynta, failed spinal stimul trial, oxycontin hs + tramadol + gabap as of 12/2017 f/u Dr. Letta Pate.   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   Sick sinus syndrome (HCC)    PPM placed   Thrombocytopenia ([place]) 2018   HSM on 2007 abd u/s---suspect some mild splenic sequestration chronically.   Unspecified glaucoma(365.9)    Unspecified hereditary and idiopathic peripheral neuropathy approx age 31   bilat LE's, ? left arm, too.  Feet became progressively numb + left foot pain intermittently.  Pt may be trying a spinal stimulator (as of 05/2015)   VENOUS INSUFFICIENCY    Being followed by Dr. Sharol Given as of 10/2016 for two R LL venous stasis ulcers/skin tears.  Healed as of 10/30/16 f/u with Dr. Sharol Given.   VENTRAL HERNIA    Weakness 02/23/2021   Wound dehiscence 10/22/2019    Family History  Problem Relation Age of Onset   Hypertension Mother    Coronary artery disease Mother    Heart attack Mother    Neuropathy Mother    Pulmonary fibrosis Father        asbestosis    Past Surgical History:  Procedure Laterality Date   AMPUTATION Left 04/11/2013   Procedure: AMPUTATION DIGIT Left 3rd toe;  Surgeon: [name], MD;  Location: [facility];  Service: Orthopedics;  Laterality: Left;  Left 3rd toe amputation at [facility]   AMPUTATION Left 08/29/2019   Procedure: LEFT TRANSMETATARSAL AMPUTATION;  Surgeon: [name], MD;  Location:  [facility];  Service: Orthopedics;  Laterality: Left;   BIOPSY  05/04/2020   Procedure: BIOPSY;  Surgeon: Jackquline Denmark, MD;  Location: [name] ENDOSCOPY;  Service: Endoscopy;;   [place]; 03/10/16   1997 Non-obstructive disease.  03/2016 BMS to RCA, with 25% pDiag dz, o/w normal cors per cath 03/07/16.  Cath 11/01/16: in stent restenosis, successful baloon angioplasty. 08/2020 branch vessel dz, cont medical therapy rec'd.   CARDIAC CATHETERIZATION  12/24/2012   mild < 20% LCx, prox 30% RCA; LVEF 55-65% , moderate pulmonary HTN, moderate AS   CARDIAC DEFIBRILLATOR PLACEMENT  11/07/2017   [facility] MRI Quad CRT defibrillator   CARDIOVASCULAR STRESS TEST  05/11/16 ([facility])   2017 Myocardial perfusion imaging:  No ischemia; scar in apex, global hypokinesis, EF 36%.  06/15/20->mod primarily fixed inferolat wall defect mildly worse with stress c/w infarct/scar with mild peri-infarct ischemia, normal EF-->for cath per cards.   Carotid dopplers  03/09/2016   [facility]: no hemodynamically significant stenosis on either side.   CHOLECYSTECTOMY     COLONOSCOPY [name] 10/30/2012   Procedure: COLONOSCOPY;  Surgeon: [name], MD;  Location: [facility] ENDOSCOPY;  Service: Endoscopy;  Laterality: [name];   CORONARY ANGIOPLASTY WITH STENT PLACEMENT  03/2016; 04/2017   2017-[facility]: BMS to RCA-pt was placed on Brilinta.  04/2017: DES to RCA.   DORSAL SLIT [name] 10/29/2019   for severe phimosis. Procedure: DORSAL SLIT;  Surgeon: Alexis Frock, MD;  Location: [facility] ORS;  Service: Urology;  Laterality: [name];  [place]  05/04/2020   Procedure: ESOPHAGEAL DILATION;  Surgeon: Jackquline Denmark, MD;  Location[facility];  Service: Endoscopy;;   ESOPHAGOGASTRODUODENOSCOPY (EGD) WITH PROPOFOL [name] 05/04/2020   Procedure: ESOPHAGOGASTRODUODENOSCOPY (EGD) WITH PROPOFOL;  Surgeon: Jackquline Denmark, MD;  Location: [name] ENDOSCOPY;  Service: Endoscopy;  Laterality: [name];   EYE SURGERY Bilateral cataract   HEMORRHOID SURGERY      INTRAOCULAR LENS INSERTION Bilateral    KNEE SURGERY Right    LEFT AND RIGHT HEART CATHETERIZATION WITH CORONARY  ANGIOGRAM [name] 12/24/2012   Procedure: LEFT AND RIGHT HEART CATHETERIZATION WITH CORONARY ANGIOGRAM;  Surgeon: [name], MD;  Location: [facility] CATH LAB;  Service: Cardiovascular;  Laterality: [name];   LEG SURGERY Bilateral    lenghtening    PACEMAKER PLACEMENT  04/13/2016   2nd deg HB after TAVR, pt had DC MDT PPM placed.   SHOULDER ARTHROSCOPY  08/30/2011   Procedure: ARTHROSCOPY SHOULDER;  Surgeon: [name], MD;  Location: [facility];  Service: Orthopedics;  Laterality: Right;  Right Shoulder Arthroscopy, Debridement, and Decompression   SPINAL CORD STIMULATOR INSERTION [name] 09/10/2015   Procedure: LUMBAR SPINAL CORD STIMULATOR INSERTION;  Surgeon: [name], MD;  Location: [facility] NEURO ORS;  Service: Neurosurgery;  Laterality: [name];   TEE WITHOUT CARDIOVERSION [name] 09/16/2019   Procedure: TRANSESOPHAGEAL ECHOCARDIOGRAM (TEE);  Surgeon: Skeet Latch, MD;  Location: [facility] ENDOSCOPY;  Service: Cardiovascular;  Laterality: [name];   TEE WITHOUT CARDIOVERSION [name] 12/02/2019   +vegetation on AVR and pacer lead in RV.  EF 55-60%, normal wall motion.  Valves function normal.  Procedure: TRANSESOPHAGEAL ECHOCARDIOGRAM (TEE);  Surgeon: Geralynn Rile, MD;  Location: [facility];  Service: Cardiovascular;  Laterality: [name];   TOE AMPUTATION Left    due to osteomyelitis.  R big toe surg due to osteoarth   TONSILLECTOMY     traeculectomy Left    eye   TRANSCATHETER AORTIC VALVE REPLACEMENT, TRANSFEMORAL  04/11/2016   TRANSESOPHAGEAL ECHOCARDIOGRAM  03/09/2016; 09/2019   [facility]: EF 55-60%, PFO seen with bi-directional shunting, no thrombus in appendage.  09/2019 ->no valvular vegetations. Small patent foramen ovale with predominantly left to right shunting across the interatrial septum.   TRANSTHORACIC ECHOCARDIOGRAM  01/2015; 01/2016; 05/18/16; 09/18/16, 05/2017, 08/2017   01/2015 No signif change in  aortic stenosis (moderate).  01/2016 Severe LVH w/small LV cavity, EF 60-65%, grade I diast dysfxn.  05/2016 (s/p TAVR): EF 50-55%, grd I DD, biopros AV good.  08/2016--EF 50-55%, LV septal motion c/w conduction abnl, grd I DD,mild MS,bioprosth aortic valve well seated, w/trace AR. 05/2017 TTE EF 35%. 08/2017-EF 35%, mod diff hypokin LV, grd I DD, biopros AV good.    TRANSTHORACIC ECHOCARDIOGRAM  04/2018; 09/2019   04/2018: EF 40-45%, mod diffuse LV hypokinesis, grd I DD, bioprosth AV well seated, no AS or AR. 09/2019 EF 60-65%, grd I DD, valves fine, including bioprosth AV. 04/29/20 (tech diff) EF 55-60%, grd I DD, vegetation on MV.   VITRECTOMY     Social History   Occupational History   Occupation: Engineer, production    Comment: retired  Tobacco Use   Smoking status: Never   Smokeless tobacco: Never  Vaping Use   Vaping Use: Never used  Substance and Sexual Activity   Alcohol use: No    Alcohol/week: 0.0 standard drinks   Drug use: No    Types: Oxycodone   Sexual activity: Not Currently

## 2021-05-11 ENCOUNTER — Encounter: Payer: Self-pay | Admitting: Family Medicine

## 2021-05-11 ENCOUNTER — Ambulatory Visit: Payer: Medicare Other | Admitting: Family Medicine

## 2021-05-11 ENCOUNTER — Other Ambulatory Visit: Payer: Self-pay

## 2021-05-11 VITALS — BP 120/71 | HR 70 | Temp 98.0°F | Ht 72.0 in | Wt 335.0 lb

## 2021-05-11 DIAGNOSIS — I480 Paroxysmal atrial fibrillation: Secondary | ICD-10-CM

## 2021-05-11 DIAGNOSIS — Z23 Encounter for immunization: Secondary | ICD-10-CM | POA: Diagnosis not present

## 2021-05-11 DIAGNOSIS — Z7901 Long term (current) use of anticoagulants: Secondary | ICD-10-CM | POA: Diagnosis not present

## 2021-05-11 DIAGNOSIS — E1140 Type 2 diabetes mellitus with diabetic neuropathy, unspecified: Secondary | ICD-10-CM

## 2021-05-11 DIAGNOSIS — I5042 Chronic combined systolic (congestive) and diastolic (congestive) heart failure: Secondary | ICD-10-CM

## 2021-05-11 DIAGNOSIS — R5381 Other malaise: Secondary | ICD-10-CM

## 2021-05-11 LAB — CBC
HCT: 37 % — ABNORMAL LOW (ref 39.0–52.0)
Hemoglobin: 12.2 g/dL — ABNORMAL LOW (ref 13.0–17.0)
MCHC: 32.9 g/dL (ref 30.0–36.0)
MCV: 90.2 fl (ref 78.0–100.0)
Platelets: 122 10*3/uL — ABNORMAL LOW (ref 150.0–400.0)
RBC: 4.1 Mil/uL — ABNORMAL LOW (ref 4.22–5.81)
RDW: 14.8 % (ref 11.5–15.5)
WBC: 6.1 10*3/uL (ref 4.0–10.5)

## 2021-05-11 LAB — BASIC METABOLIC PANEL
BUN: 21 mg/dL (ref 6–23)
CO2: 31 mEq/L (ref 19–32)
Calcium: 8.8 mg/dL (ref 8.4–10.5)
Chloride: 104 mEq/L (ref 96–112)
Creatinine, Ser: 0.89 mg/dL (ref 0.40–1.50)
GFR: 80.4 mL/min (ref >60.00)
Glucose, Bld: 126 mg/dL — ABNORMAL HIGH (ref 70–99)
Potassium: 4.3 mEq/L (ref 3.5–5.1)
Sodium: 141 mEq/L (ref 135–145)

## 2021-05-11 LAB — HEMOGLOBIN A1C: Hgb A1c MFr Bld: 6.2 % (ref 4.6–6.5)

## 2021-05-11 NOTE — Progress Notes (Signed)
OFFICE VISIT  05/11/2021  CC: f/u DM, chronic cardiac problems, debilitation   HPI:    Tim [name] is a 80 y.o. male who presents for 3 mo f/u DM with DPN, CAD/CHF/PAF, debilitation. A/P as of last visit: "1) DM with diabetic peripheral neuropathy. Last a1c excellent 3 mo ago.  Diet only. Hba1c today.  Lytes/cr today.   2) CAD, hx of combined systolic/diastolic HF. Occ low bp, esp when doesn't eat/drink well (like today). Cont toprol xl 25 qd and entresto 24-26 bid, and ASA 36m qd. Has torsemide 254mto take qd prn but doesn't have to take it much at all. Fluid balance normal today. Says cardiology f/u planned again for May 2023.   3) PAF, hx of heart block: he has pacer/icd and is on eliquis 86m44mid. No sign of bleeding. Lytes/cr check today.   4) Debilitated pt: his multiple cardiopulmonary comorbidities + neuropathy and orthopedic complications+ morbid obesity have limited his mobility to using 2 canes to ambulate, has WC at home as well.   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 Cont amoxil 1g bid suppressive therapy. Since his foot does not appear to have osteomyelitis anymore they have stopped talking about possible L BKA but ortho is monitoring him regularly, as is ID MD.   6) Preventative health care: Shingrix rx sent to pharmacy today."  INTERIM HX: Says he is doing about the same, everything stable.  In fact he says Dr. DudSharol Givens told him that the left foot wound seems to be healing some. 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  He says he eats a fairly low carbohydrate diet.  No home glucose monitoring.  Occasional blood pressure check at home has been 120/70 range.   No recent lower extremity swelling.  He has not had to take any torsemide in quite some time.  His most debilitating condition seems to be chronic low back pain as well as  chronic bilateral neuropathic pain in his feet.  Gabapentin 800 mg 4 times a day is significantly helpful for his neuropathic pain.  If he is sitting his back does not bother him much at all.  He is able to ambulate in his home and in the community for short distances using forearm crutches.  Most of the time he is in a wheelchair when he needs to get places.  No recent falls.  ROS as above, plus--> no fevers, no CP, no SOB, no wheezing, no cough, no dizziness, no HAs, no rashes, no melena/hematochezia.  No polyuria or polydipsia.  No myalgias or arthralgias.  No focal weakness, paresthesias, or tremors.  No acute vision or hearing abnormalities.  No dysuria or unusual/new urinary urgency or frequency.  He empties his bladder well.  No recent changes in lower legs. No n/v/d or abd pain.  No palpitations.     Past Medical History:  Diagnosis Date   AICD (automatic cardioverter/defibrillator) present    Balanitis    +severe phimosis+ buried penis->circ not possible but dorsal slit done 10/2019   BPH (benign prostatic hypertrophy)    with urinary retention.  Renal u/s 12/04/19 NORMAL KIDNEYS, NORMAL BLADDER, NO HYDRONEPHROSIS   CAD (coronary artery disease)    Nonobstructive by 12/2012 cath; then 03/2016 he required BMS to RCA ([facility]).  In-stent restenosis on cath 11/01/16, baloon angioplasty successful.   CATARACT, HX OF    Chronic combined systolic  and diastolic CHF (congestive heart failure) ([place]) 05/2016   Ischemic CM.  04/2018 EF 40-45%, grd I DD.   Chronic pain syndrome    Lumbar DDD; chronic neuropathic pain (DM); has spinal stimulator and sees pain mgmt MD   Complicated UTI (urinary tract infection) 09/2019   Phimoses, acute urinary retention, entoerococcus UTI, enterococc bacteremia.  F/u blood clx's neg x 5d.     COPD    Diabetes mellitus type 2 with complications ([place])    URK2H jumped from 5.7% to 6.1% 03/2015---started metformin at that time.  DM 2 dx by fasting gluc criteria 2018.  Has  chronic neuropath pain   Enterococcal bacteremia 09/2019   Phimoses, acute urinary retention, entoerococcus UTI, enterococc bacteremia.  F/u blood clx's neg x 5d.     Essential hypertension, benign    Fatty liver 2007   2007 u/s showed fatty liver with hepatosplenomegaly.  2019 repeat u/s->fatty liver but no cirrhosis or hepatosplenomegaly.   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 Tommy Medal)   Lumbosacral neuritis    Lumbosacral spondylosis    Lumbar spinal stenosis with neurogenic claudication--contributes to his chronic pain syndrome   Normal memory function 08/2014   Neuropsychological testing ([facility]): no cognitive impairment or sign of neurodegenerative disorder.  Likely has adjustment d/o with mixed anxiety/depressed features and may benefit from low dose SNRI.     Normocytic anemia 03/2016   Mild-pt needs ferritin and vit B12 level checked (as of 03/22/16). Hb stable 09/2019   NSTEMI (non-ST elevated myocardial infarction) ([place]) 03/20/2016   BMS to RCA   Obesity hypoventilation syndrome (HCC)    Orthostatic hypotension    OSA on CPAP    8 cm H2O   OSTEOARTHRITIS    PAF (paroxysmal atrial fibrillation) (HCC)    Paroxysmal atrial fibrillation ([place]) 2003    (? chronic?) Off anticoag for a while due to falls.  Then apixaban started 12/2014.   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 Ella Bodo): neuropathic pain->diff to treat, failed nucynta, failed spinal stimul trial, oxycontin hs + tramadol + gabap as of 12/2017 f/u Dr. Letta Pate.   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   Sick sinus syndrome (HCC)    PPM placed   Thrombocytopenia ([place]) 2018   HSM on 2007 abd u/s---suspect some mild splenic sequestration chronically.   Unspecified glaucoma(365.9)    Unspecified hereditary  and idiopathic peripheral neuropathy approx age 64   bilat LE's, ? left arm, too.  Feet became progressively numb + left foot pain intermittently.  Pt may be trying a spinal stimulator (as of 05/2015)   VENOUS INSUFFICIENCY    Being followed by Dr. Sharol Given as of 10/2016 for two R LL venous stasis ulcers/skin tears.  Healed as of 10/30/16 f/u with Dr. Sharol Given.   VENTRAL HERNIA    Weakness 02/23/2021   Wound dehiscence 10/22/2019    Past Surgical History:  Procedure Laterality Date   AMPUTATION Left 04/11/2013   Procedure: AMPUTATION DIGIT Left 3rd toe;  Surgeon: [name], MD;  Location: [facility];  Service: Orthopedics;  Laterality: Left;  Left 3rd toe amputation at [facility]   AMPUTATION Left 08/29/2019   Procedure: LEFT TRANSMETATARSAL AMPUTATION;  Surgeon: [name], MD;  Location: [facility];  Service: Orthopedics;  Laterality: Left;   BIOPSY  05/04/2020   Procedure: BIOPSY;  Surgeon: Jackquline Denmark, MD;  Location: [facility] ENDOSCOPY;  Service: Endoscopy;;   [place]; 03/10/16   1997 Non-obstructive disease.   03/2016 BMS to RCA, with 25% pDiag dz, o/w normal cors per cath 03/07/16.  Cath 11/01/16: in stent restenosis, successful baloon angioplasty. 08/2020 branch vessel dz, cont medical therapy rec'd.   CARDIAC CATHETERIZATION  12/24/2012   mild < 20% LCx, prox 30% RCA; LVEF 55-65% , moderate pulmonary HTN, moderate AS   CARDIAC DEFIBRILLATOR PLACEMENT  11/07/2017   [facility] MRI Quad CRT defibrillator   CARDIOVASCULAR STRESS TEST  05/11/16 ([facility])   2017 Myocardial perfusion imaging:  No ischemia; scar in apex, global hypokinesis, EF 36%.  06/15/20->mod primarily fixed inferolat wall defect mildly worse with stress c/w infarct/scar with mild peri-infarct ischemia, normal EF-->for cath per cards.   Carotid dopplers  03/09/2016   [facility]: no hemodynamically significant stenosis on either side.   CHOLECYSTECTOMY     COLONOSCOPY [name] 10/30/2012   Procedure: COLONOSCOPY;  Surgeon: [name], MD;  Location: [facility] ENDOSCOPY;  Service: Endoscopy;  Laterality: [name];   CORONARY ANGIOPLASTY WITH STENT PLACEMENT  03/2016; 04/2017   2017-[facility]: BMS to RCA-pt was placed on Brilinta.  04/2017: DES to RCA.   DORSAL SLIT [name] 10/29/2019   for severe phimosis. Procedure: DORSAL SLIT;  Surgeon: Alexis Frock, MD;  Location: [facility] ORS;  Service: Urology;  Laterality: [name];  [place]  05/04/2020   Procedure: ESOPHAGEAL DILATION;  Surgeon: Jackquline Denmark, MD;  Location: [facility] ENDOSCOPY;  Service: Endoscopy;;   ESOPHAGOGASTRODUODENOSCOPY (EGD) WITH PROPOFOL [name] 05/04/2020   Procedure: ESOPHAGOGASTRODUODENOSCOPY (EGD) WITH PROPOFOL;  Surgeon: Jackquline Denmark, MD;  Location: [name] ENDOSCOPY;  Service: Endoscopy;  Laterality: [name];   EYE SURGERY Bilateral cataract   HEMORRHOID SURGERY     INTRAOCULAR LENS INSERTION Bilateral    KNEE SURGERY Right    LEFT AND RIGHT HEART CATHETERIZATION WITH CORONARY ANGIOGRAM [name] 12/24/2012   Procedure: LEFT AND RIGHT HEART CATHETERIZATION WITH CORONARY ANGIOGRAM;  Surgeon: [name], MD;   Location: [name] CATH LAB;  Service: Cardiovascular;  Laterality: [name];   LEG SURGERY Bilateral    lenghtening    PACEMAKER PLACEMENT  04/13/2016   2nd deg HB after TAVR, pt had DC MDT PPM placed.   SHOULDER ARTHROSCOPY  08/30/2011   Procedure: ARTHROSCOPY SHOULDER;  Surgeon: [name], MD;  Location: [facility];  Service: Orthopedics;  Laterality: Right;  Right Shoulder Arthroscopy, Debridement, and Decompression   SPINAL CORD STIMULATOR INSERTION [name] 09/10/2015   Procedure:  LUMBAR SPINAL CORD STIMULATOR INSERTION;  Surgeon: [name], MD;  Location: [facility] NEURO ORS;  Service: Neurosurgery;  Laterality: [name];   TEE WITHOUT CARDIOVERSION [name] 09/16/2019   Procedure: TRANSESOPHAGEAL ECHOCARDIOGRAM (TEE);  Surgeon: Skeet Latch, MD;  Location: [facility] ENDOSCOPY;  Service: Cardiovascular;  Laterality: [name];   TEE WITHOUT CARDIOVERSION [name] 12/02/2019   +vegetation on AVR and pacer lead in RV.  EF 55-60%, normal wall motion.  Valves function normal.  Procedure: TRANSESOPHAGEAL ECHOCARDIOGRAM (TEE);  Surgeon: Geralynn Rile, MD;  Location: [facility];  Service: Cardiovascular;  Laterality: [name];   TOE AMPUTATION Left    due to osteomyelitis.  R big toe surg due to osteoarth   TONSILLECTOMY     traeculectomy Left    eye   TRANSCATHETER AORTIC VALVE REPLACEMENT, TRANSFEMORAL  04/11/2016   TRANSESOPHAGEAL ECHOCARDIOGRAM  03/09/2016; 09/2019   [facility]: EF 55-60%, PFO seen with bi-directional shunting, no thrombus in appendage.  09/2019 ->no valvular vegetations. Small patent foramen ovale with predominantly left to right shunting across the interatrial septum.   TRANSTHORACIC ECHOCARDIOGRAM  01/2015; 01/2016; 05/18/16; 09/18/16, 05/2017, 08/2017   01/2015 No signif change in aortic stenosis (moderate).  01/2016 Severe LVH w/small LV cavity, EF 60-65%, grade I diast dysfxn.  05/2016 (s/p TAVR): EF 50-55%, grd I DD, biopros AV good.  08/2016--EF 50-55%, LV septal motion c/w conduction abnl, grd I DD,mild MS,bioprosth  aortic valve well seated, w/trace AR. 05/2017 TTE EF 35%. 08/2017-EF 35%, mod diff hypokin LV, grd I DD, biopros AV good.    TRANSTHORACIC ECHOCARDIOGRAM  04/2018; 09/2019   04/2018: EF 40-45%, mod diffuse LV hypokinesis, grd I DD, bioprosth AV well seated, no AS or AR. 09/2019 EF 60-65%, grd I DD, valves fine, including bioprosth AV. 04/29/20 (tech diff) EF 55-60%, grd I DD, vegetation on MV.   VITRECTOMY      Outpatient Medications Prior to Visit  Medication Sig Dispense Refill   albuterol (PROVENTIL HFA;VENTOLIN HFA) 108 (90 BASE) MCG/ACT inhaler Inhale 2 puffs into the lungs 4 (four) times daily as needed for wheezing or shortness of breath.     allopurinol (ZYLOPRIM) 300 MG tablet TAKE 1 TABLET BY MOUTH EVERY DAY WITH FOOD 90 tablet 0   amoxicillin (AMOXIL) 500 MG tablet Take 2 tablets (1,000 mg total) by mouth 2 (two) times daily. 120 tablet 11   apixaban (ELIQUIS) 5 MG TABS tablet Take 5 mg by mouth 2 (two) times daily.     ASPIRIN LOW DOSE 81 MG EC tablet Take 81 mg by mouth daily.     B Complex-C (B-COMPLEX WITH VITAMIN C) tablet Take 1 tablet by mouth daily. Take 1 tablet daily, 235m     cadexomer iodine (IODOSORB) 0.9 % gel Apply 1 application topically daily as needed for wound care. Apply to the affected area daily plus dry dressing 40 g 3   ezetimibe (ZETIA) 10 MG tablet Take 1 tablet (10 mg total) by mouth daily. 30 tablet 1   finasteride (PROSCAR) 5 MG tablet TAKE 1 TABLET BY MOUTH EVERY DAY 90 tablet 0   fluticasone (FLONASE) 50 MCG/ACT nasal spray Place 2 sprays into both nostrils as needed for allergies or rhinitis.     fluticasone-salmeterol (WIXELA INHUB) 250-50 MCG/ACT AEPB INHALE 1 PUFF BY MOUTH TWICE A DAY 60 each 5   gabapentin (NEURONTIN) 800 MG tablet Take 1 tablet (800 mg total) by mouth 4 (four) times daily. 360 tablet 3   ipratropium (ATROVENT) 0.03 % nasal spray 2 SPRAY EACH NOSTRIL EVERY  12 HOURS FOR RUNNY NOSE 30 mL 2   KLOR-CON M20 20 MEQ tablet TAKE 1 TABLET BY  MOUTH EVERY DAY 90 tablet 1   metoprolol succinate (TOPROL-XL) 25 MG 24 hr tablet Take 25 mg by mouth daily.     Multiple Vitamin (MULTIVITAMIN ADULT PO) Take by mouth daily.     niacin (NIASPAN) 1000 MG CR tablet TAKE 1 TABLET BY MOUTH TWICE A DAY 180 tablet 0   nitroGLYCERIN (NITROSTAT) 0.4 MG SL tablet Place 0.4 mg under the tongue every 5 (five) minutes as needed for chest pain.     pantoprazole (PROTONIX) 40 MG tablet TAKE 1 TABLET BY MOUTH EVERY DAY 90 tablet 1   rosuvastatin (CRESTOR) 40 MG tablet take 1 tablet by mouth once daily (Tim [name] taking differently: Take 40 mg by mouth daily.) 15 tablet 0   sacubitril-valsartan (ENTRESTO) 24-26 MG Take 1 tablet by mouth 2 (two) times daily.      vitamin C (ASCORBIC ACID) 250 MG tablet Take 250 mg by mouth daily.     VITAMIN D, CHOLECALCIFEROL, PO Take 1 tablet by mouth daily.      torsemide (DEMADEX) 20 MG tablet Take 1 tablet (20 mg total) by mouth in the morning and at bedtime. (Tim [name] not taking: No sig reported) 60 tablet 0   No facility-administered medications prior to visit.    Allergies  Allergen Reactions   Brimonidine Tartrate Shortness Of Breath    Alphagan-Shortness of breath   Brinzolamide Shortness Of Breath    AZOPT- Shortness of breath   Latanoprost Shortness Of Breath    XALATAN- Shortness of breath   Nucynta [Tapentadol] Shortness Of Breath   Sulfa Antibiotics Palpitations   Timolol Maleate Shortness Of Breath and Other (See Comments)    TIMOPTIC- Aggravated asthma   Diltiazem Swelling     leg swelling   Rofecoxib Swelling     VIOXX- leg swelling   Vancomycin Hives and Other (See Comments)    Possible "Red Man Syndrome"? > hives/blisters   Codeine Other (See Comments)    Childhood reaction   Tamsulosin Other (See Comments)    Dizziness    Celecoxib Other (See Comments)    CELLBREX-confusion   Colchicine Diarrhea    diarrhea   Tape Rash    ROS As per HPI  PE: Vitals with BMI 05/11/2021 02/23/2021  02/09/2021  Height 6' 0"  - 6' 0"   Weight 335 lbs - 335 lbs  BMI 10.93 - 23.55  Systolic 732 202 97  Diastolic 71 78 61  Pulse 70 73 75   Gen: Alert, not acutely ill. Sitting up in [facility], attentive.  Tim [name] is oriented to person, place, time, and situation. AFFECT: pleasant, lucid thought and speech. CV: RRR, soft systolic murmur, no diastolic murmur, no r/g.   LUNGS: CTA bilat, nonlabored resps, good aeration in all lung fields. EXT: no clubbing or cyanosis.  Trace pitting edema is palpable through his compressing hose bilat. I did not undo the wrapping on his left foot.     LABS:  Lab Results  Component Value Date   TSH 0.905 04/29/2020   Lab Results  Component Value Date   WBC 7.2 07/14/2020   HGB 12.2 (L) 07/14/2020   HCT 36.7 (L) 07/14/2020   MCV 89.3 07/14/2020   PLT 121 (L) 07/14/2020   Lab Results  Component Value Date   IRON 59 05/24/2016   IRON 54 05/24/2016   TIBC 334 05/24/2016   FERRITIN 44.8 05/24/2016   Lab  Results  Component Value Date   CREATININE 0.75 02/09/2021   BUN 18 02/09/2021   NA 141 02/09/2021   K 4.3 02/09/2021   CL 106 02/09/2021   CO2 28 02/09/2021   Lab Results  Component Value Date   ALT 35 11/01/2020   AST 39 11/01/2020   ALKPHOS 75 11/01/2020   BILITOT 0.4 05/07/2020   Lab Results  Component Value Date   CHOL 115 11/01/2020   Lab Results  Component Value Date   HDL 62 11/01/2020   Lab Results  Component Value Date   LDLCALC 41 11/01/2020   Lab Results  Component Value Date   TRIG 54 11/01/2020   Lab Results  Component Value Date   CHOLHDL 2 12/25/2018   Lab Results  Component Value Date   PSA 0.23 11/23/2015   PSA 0.33 10/07/2014   PSA 0.40 09/25/2013   Lab Results  Component Value Date   HGBA1C 6.0 02/09/2021   IMPRESSION AND PLAN:  1)  Diabetes type 2.  Diet controlled.  Last hemoglobin A1c 3 months ago was 6%.  Repeat hemoglobin A1c and nonfasting glucose today.  2) CAD, hx of combined  systolic/diastolic HF. History of some low blood pressures in the remote past but this has not been a problem lately. Cont zetia 10 qd, rosuva 40 qd, toprol xl 25 qd and entresto 1/2 of 24-26 tab bid, and ASA 47m qd. Has torsemide 280mto take qd prn but doesn't have to take it much at all. Fluid balance normal today. He gets regular cardiology follow-up.   3) PAF, hx of heart block: he has pacer/icd and is on eliquis 87m87mid. No sign of bleeding. Lytes/cr and CBC check today.   4) Debilitated pt: his multiple cardiopulmonary comorbidities + neuropathy and orthopedic complications+ morbid obesity have limited his mobility using forearm crutches to ambulate, has WC at home as well.   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 Cont amoxil 1g bid suppressive therapy. Since his foot does not appear to have osteomyelitis anymore they have stopped talking about possible L BKA but ortho is monitoring him regularly, as is ID MD.  An After Visit Summary was printed and given to the Tim [name].  FOLLOW UP: Return in about 3 months (around 08/11/2021) for routine chronic illness f/u.  Signed:  PhiCrissie SicklesD           05/11/2021

## 2021-05-13 ENCOUNTER — Other Ambulatory Visit: Payer: Self-pay | Admitting: Family Medicine

## 2021-05-23 ENCOUNTER — Other Ambulatory Visit: Payer: Self-pay | Admitting: Family Medicine

## 2021-05-24 ENCOUNTER — Other Ambulatory Visit: Payer: Self-pay

## 2021-05-24 ENCOUNTER — Ambulatory Visit (INDEPENDENT_AMBULATORY_CARE_PROVIDER_SITE_OTHER): Payer: Medicare Other | Admitting: Orthopedic Surgery

## 2021-05-24 DIAGNOSIS — L97521 Non-pressure chronic ulcer of other part of left foot limited to breakdown of skin: Secondary | ICD-10-CM | POA: Diagnosis not present

## 2021-05-24 DIAGNOSIS — Z89432 Acquired absence of left foot: Secondary | ICD-10-CM | POA: Diagnosis not present

## 2021-05-28 IMAGING — CR DG FOOT COMPLETE 3+V*L*
3 series · 3 of 3 positions shown · non-contrast
Comparison: None.

CLINICAL DATA: Wound to plantar surface of foot.

EXAM:
LEFT FOOT - COMPLETE 3+ VIEW

[x foot ap left]
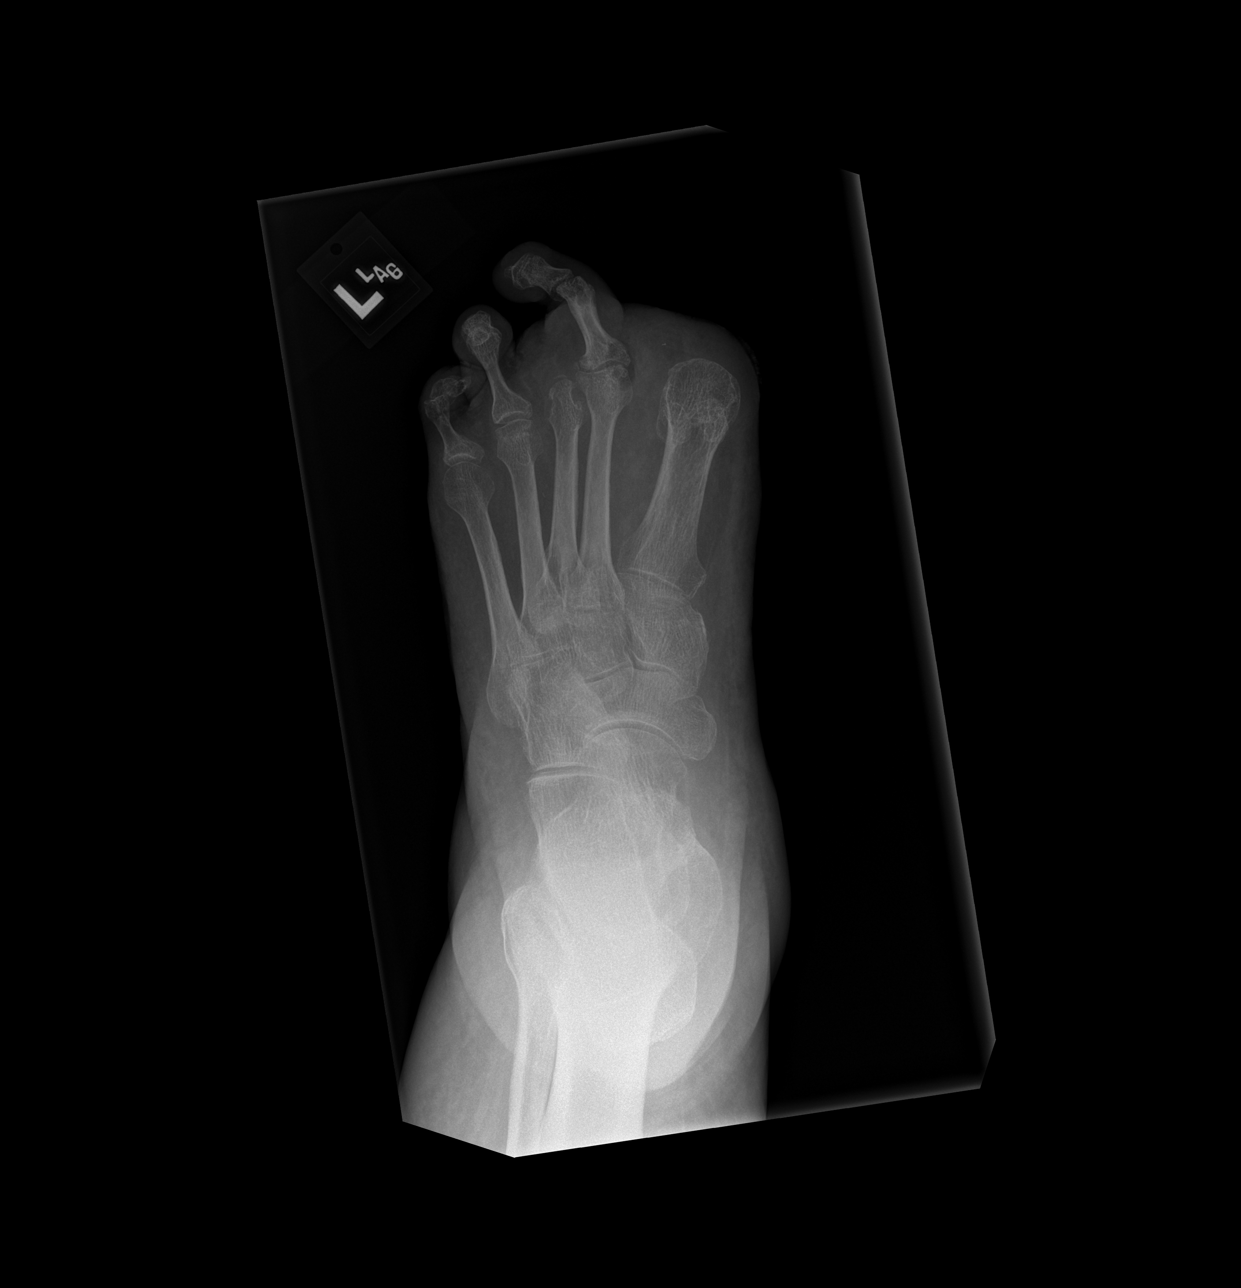

[x foot obl left]
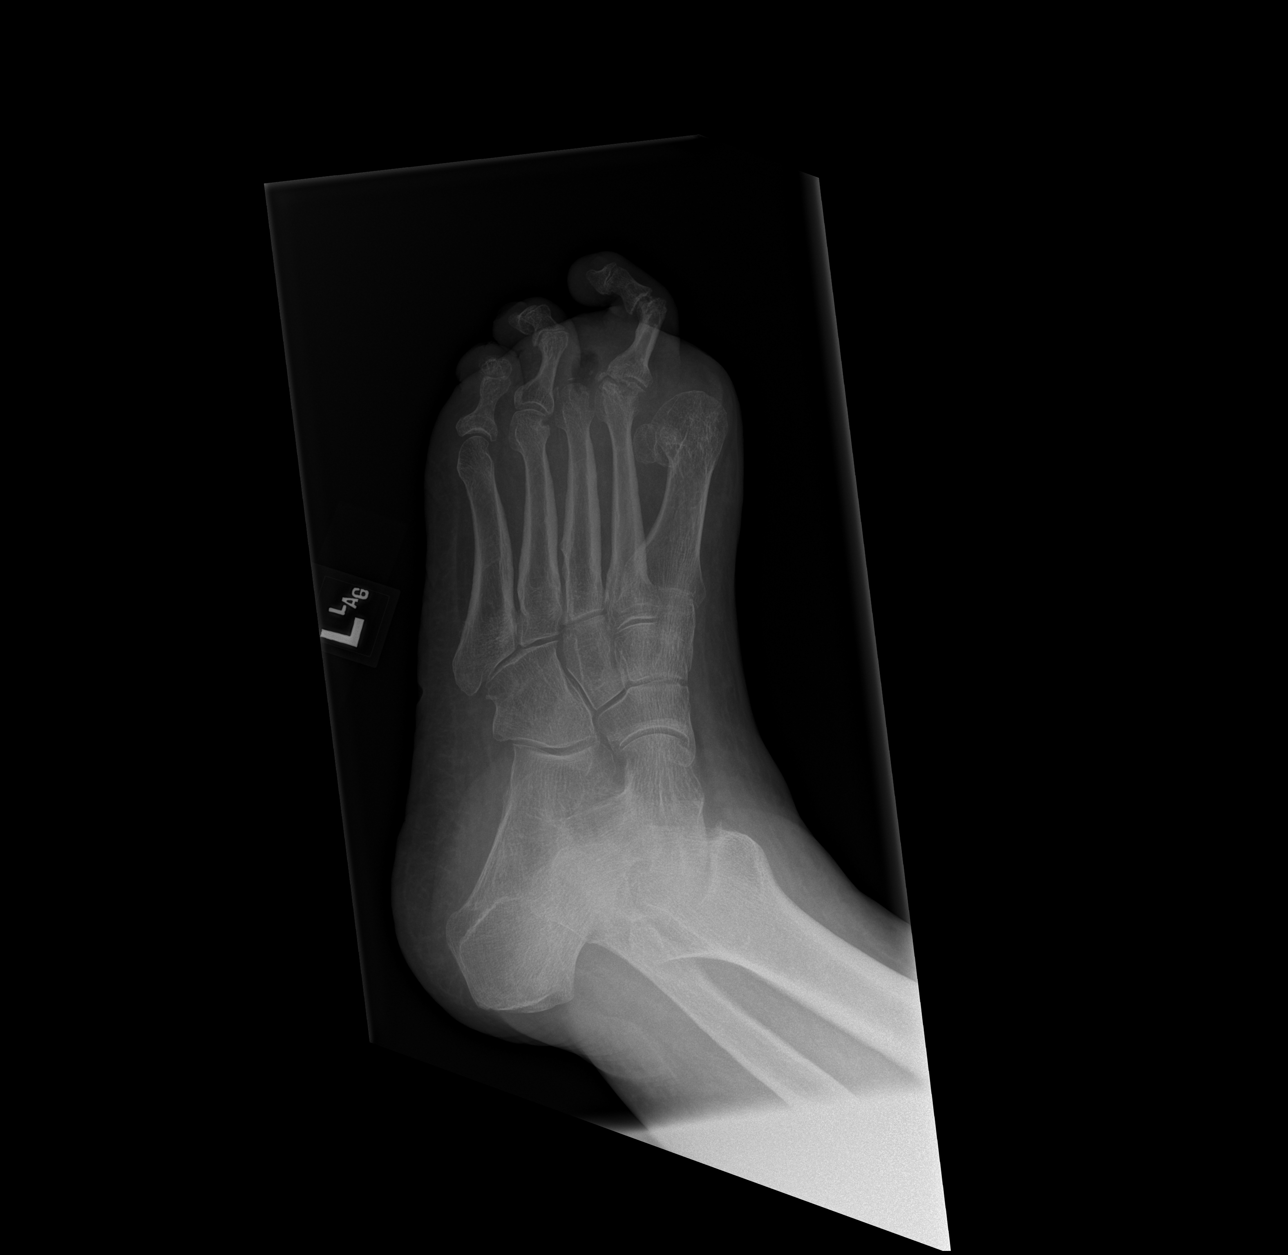

[x foot lat left]
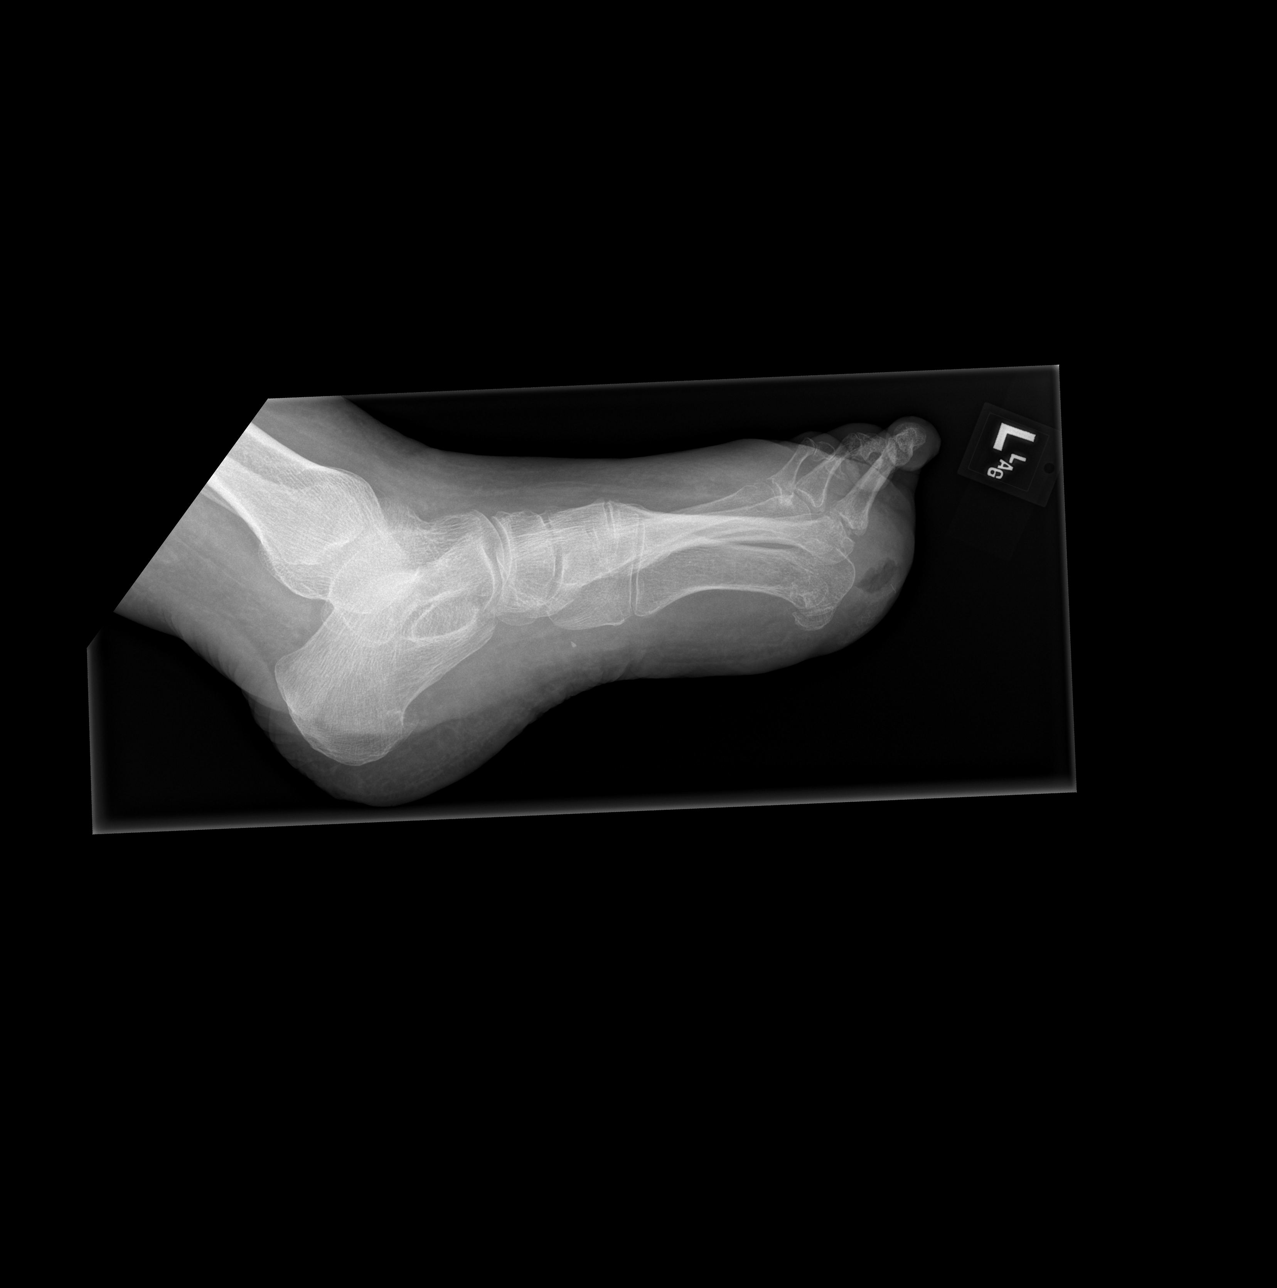

[3 of 3 positions shown; findings below may reference images not displayed]

FINDINGS: Prior amputation of the 1st and 3rd toes at the MTP joint level. No
acute bony abnormality. Specifically, no fracture, subluxation, or
dislocation. Soft tissue gas noted along the plantar distal surface.
No radiographic changes of osteomyelitis.
IMPRESSION: No radiographic changes of osteomyelitis.

## 2021-05-30 ENCOUNTER — Encounter: Payer: Self-pay | Admitting: Orthopedic Surgery

## 2021-05-30 NOTE — Progress Notes (Signed)
Office Visit Note   Patient: Tim [name]           Date of Birth: [birth year]           MRN: [id number] Visit Date: 05/24/2021              Requested by: Tammi Sou, MD [address] [place],  [place] [address] PCP: Tammi Sou, MD  Chief Complaint  Patient presents with   Left Foot - Wound Check    Hx of Left TMA, ulcer check      HPI: Patient is an 80 year old gentleman who presents with an ulcer left transmetatarsal amputation.  He is not wearing compression stockings at this time.  Assessment & Plan: Visit Diagnoses:  1. History of transmetatarsal amputation of left foot ([place])   2. Non-pressure chronic ulcer of other part of left foot limited to breakdown of skin ([place])     Plan: Ulcer was debrided of skin and soft tissue the foot wound continues to improve.  Iodosorb dressing applied.  Follow-Up Instructions: Return in about 3 weeks (around 06/14/2021).   Ortho Exam  [name], oriented, no adenopathy, well-dressed, normal affect, normal respiratory effort. Patient states that he has recently had increased neuropathic pain which has been made worse with his compression stocking.  Patient states he has been having increased swelling from not using the compression.  Patient has an end bearing ulcer of the left transmetatarsal amputation.  After informed consent a 10 blade knife was used to debride the skin and soft tissue back to healthy viable granulation tissue.  The ulcer was 2 cm diameter prior to debridement 2 x 3 cm in diameter after debridement and 3 mm deep.  Silver nitrate was used for hemostasis and Iodosorb dressing was applied.  There is no exposed bone or tendon no ascending cellulitis.  The wound continues to show improvement.  Imaging: No results found. No images are attached to the encounter.  Labs: Lab Results  Component Value Date   HGBA1C 6.2 05/11/2021   HGBA1C 6.0 02/09/2021   HGBA1C 5.5 11/01/2020   HGBA1C 5.5  11/01/2020   HGBA1C 5.5 (A) 11/01/2020   HGBA1C 5.5 11/01/2020   ESRSEDRATE 34 (H) 11/24/2020   ESRSEDRATE 38 (H) 07/14/2020   ESRSEDRATE 20 02/09/2016   CRP 1.3 11/24/2020   CRP 0.9 07/14/2020   CRP 0.7 05/03/2020   LABURIC 3.5 (L) 09/10/2013   REPTSTATUS 05/04/2020 FINAL 04/28/2020   CULT  04/28/2020    NO GROWTH 5 DAYS Performed at [facility], [place] [address], [place], [place] [address]    LABORGA ENTEROCOCCUS FAECALIS 09/11/2019     Lab Results  Component Value Date   ALBUMIN 3.6 11/01/2020   ALBUMIN 3.0 (L) 05/07/2020   ALBUMIN 2.9 (L) 05/06/2020    Lab Results  Component Value Date   MG 1.7 05/07/2020   MG 1.6 (L) 05/06/2020   MG 1.8 05/05/2020   No results found for: VD25OH  No results found for: PREALBUMIN CBC EXTENDED Latest Ref Rng & Units 05/11/2021 07/14/2020 05/07/2020  WBC 4.0 - 10.5 K/uL 6.1 7.2 5.5  RBC 4.22 - 5.81 Mil/uL 4.10(L) 4.11(L) 3.54(L)  HGB 13.0 - 17.0 g/dL 12.2(L) 12.2(L) 10.2(L)  HCT 39.0 - 52.0 % 37.0(L) 36.7(L) 32.1(L)  PLT 150.0 - 400.0 K/uL 122.0(L) 121(L) 133(L)  NEUTROABS 1,500 - 7,800 cells/uL - 4,694 3.0  LYMPHSABS 850 - 3,900 cells/uL - 1,390 1.6     There  is no height or weight on file to calculate BMI.  Orders:  No orders of the defined types were placed in this encounter.  No orders of the defined types were placed in this encounter.    Procedures: No procedures performed  Clinical Data: No additional findings.  ROS:  All other systems negative, except as noted in the HPI. Review of Systems  Objective: Vital Signs: There were no vitals taken for this visit.  Specialty Comments:  No specialty comments available.  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   ICD (implantable cardioverter-defibrillator) infection ([place]) 12/22/2019   Gram-positive bacteremia 11/27/2019   FUO (fever of unknown origin) 11/26/2019   Low blood pressure reading 11/05/2019   Wound dehiscence 10/22/2019   PFO (patent foramen ovale) 10/06/2019   ICD (implantable cardioverter-defibrillator) battery depletion    Pressure injury of skin 09/13/2019   Enterococcal bacteremia history  09/13/2019   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   Urinary retention 09/13/2019   Phimosis 09/13/2019   SIRS (systemic inflammatory response syndrome) ([place]) 09/12/2019   Sepsis ([place]) 09/12/2019   Enterococcus UTI 09/12/2019   Cellulitis of foot    History of transmetatarsal amputation of left foot ([place])    Subacute osteomyelitis, left ankle and foot ([place])    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 ([place]) 04/17/2018   Diabetic neuropathy, painful ([place]) 12/26/2016   Neuropathic pain of foot, left 06/20/2016   Insulin resistance 07/22/2015   Status post amputation of left great toe ([place]) 06/08/2015   Spinal stenosis of lumbar region 02/16/2015   Hereditary and idiopathic peripheral neuropathy 01/12/2015   Oral thrush 12/28/2014   Orthostatic dizziness 05/24/2014   Paresthesias with subjective weakness 01/30/2014   Bilateral thigh pain 01/30/2014   Abdominal wall pain in right upper quadrant 01/22/2014   Gross hematuria 09/25/2013   Intertrigo 09/25/2013   IBS (irritable bowel syndrome) 09/11/2013   Chronic pain syndrome 08/06/2013   CAD (coronary artery disease), native coronary artery 12/25/2012   Paroxysmal atrial fibrillation ([place]) 12/25/2012   Moderate aortic stenosis  12/25/2012   Aortic stenosis 12/24/2012   Personal history of colonic adenoma 10/30/2012   Diverticulosis of colon (without mention of hemorrhage) 10/30/2012   Internal hemorrhoids 10/30/2012   Change in bowel habits intermittent loose stools 10/30/2012   Routine health maintenance 06/09/2012   Hereditary peripheral neuropathy 10/10/2011   Long term current use of anticoagulant - apixiban 08/16/2010   Dyspnea on exertion 09/08/2009   HYPERCHOLESTEROLEMIA-PURE 01/13/2009   Essential hypertension, benign 01/13/2009   EDEMA 01/13/2009   GOUT 01/12/2009   GLAUCOMA 01/12/2009   Venous insufficiency (chronic) (peripheral) 01/12/2009   COPD (chronic obstructive pulmonary disease) ([place]) 01/12/2009   VENTRAL HERNIA 01/12/2009   OSTEOARTHRITIS 01/12/2009   ARTHRITIS 01/12/2009   DEGENERATIVE DISC DISEASE 01/12/2009   CATARACT, HX OF 01/12/2009   HEART MURMUR, HX OF 01/12/2009   BENIGN PROSTATIC HYPERTROPHY, HX OF 01/12/2009   Obesity 01/17/2007   OBSTRUCTIVE SLEEP APNEA 01/17/2007  ASTHMA 01/17/2007   GERD 01/17/2007   HALLUX VALGUS, ACQUIRED 01/17/2007   Pulmonary hypertension ([place]) 01/17/2007   Past Medical History:  Diagnosis Date   AICD (automatic cardioverter/defibrillator) present    Balanitis    +severe phimosis+ buried penis->circ not possible but dorsal slit done 10/2019   BPH (benign prostatic hypertrophy)    with urinary retention.  Renal u/s 12/04/19 NORMAL KIDNEYS, NORMAL BLADDER, NO HYDRONEPHROSIS   CAD (coronary artery disease)    Nonobstructive by 12/2012 cath; then 03/2016 he required BMS to RCA ([facility]).  In-stent restenosis on cath 11/01/16, baloon angioplasty successful.   CATARACT, HX OF    Chronic combined systolic and diastolic CHF (congestive heart failure) ([place]) 05/2016   Ischemic CM.  04/2018 EF 40-45%, grd I DD.   Chronic pain syndrome    Lumbar DDD; chronic neuropathic pain (DM); has spinal stimulator and sees pain mgmt MD   Complicated UTI (urinary tract  infection) 09/2019   Phimoses, acute urinary retention, entoerococcus UTI, enterococc bacteremia.  F/u blood clx's neg x 5d.     COPD    Diabetes mellitus type 2 with complications ([place])    LGX2J jumped from 5.7% to 6.1% 03/2015---started metformin at that time.  DM 2 dx by fasting gluc criteria 2018.  Has chronic neuropath pain   Enterococcal bacteremia 09/2019   Phimoses, acute urinary retention, entoerococcus UTI, enterococc bacteremia.  F/u blood clx's neg x 5d.     Essential hypertension, benign    Fatty liver 2007   2007 u/s showed fatty liver with hepatosplenomegaly.  2019 repeat u/s->fatty liver but no cirrhosis or hepatosplenomegaly.   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 Tommy Medal)   Lumbosacral neuritis    Lumbosacral spondylosis    Lumbar spinal stenosis with neurogenic claudication--contributes to his chronic pain syndrome   Normal memory function 08/2014   Neuropsychological testing ([facility]): no cognitive impairment or sign of neurodegenerative disorder.  Likely has adjustment d/o with mixed anxiety/depressed features and may benefit from low dose SNRI.     Normocytic anemia 03/2016   Mild-pt needs ferritin and vit B12 level checked (as of 03/22/16). Hb stable 09/2019   NSTEMI (non-ST elevated myocardial infarction) ([place]) 03/20/2016   BMS to RCA   Obesity hypoventilation syndrome (HCC)    Orthostatic hypotension    OSA on CPAP    8 cm H2O   OSTEOARTHRITIS    PAF (paroxysmal atrial fibrillation) (HCC)    Paroxysmal atrial fibrillation ([place]) 2003    (? chronic?) Off anticoag for a while due to falls.  Then apixaban  started 12/2014.   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 Ella Bodo): neuropathic pain->diff to treat, failed nucynta, failed spinal stimul trial, oxycontin hs + tramadol + gabap as of 12/2017 f/u Dr. Letta Pate.   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   Sick sinus syndrome (HCC)    PPM placed   Thrombocytopenia ([place]) 2018   HSM on 2007 abd u/s---suspect some mild splenic sequestration chronically.   Unspecified glaucoma(365.9)    Unspecified hereditary and idiopathic peripheral neuropathy approx age 29   bilat LE's, ? left arm, too.  Feet became progressively numb + left foot pain intermittently.  Pt may be trying a spinal stimulator (as of 05/2015)   VENOUS INSUFFICIENCY    Being followed by Dr. Sharol Given as of 10/2016 for two R LL venous stasis ulcers/skin tears.  Healed as of 10/30/16 f/u with Dr. Sharol Given.   VENTRAL HERNIA    Weakness 02/23/2021   Wound dehiscence 10/22/2019    Family History  Problem Relation Age of Onset   Hypertension Mother    Coronary artery disease Mother    Heart attack Mother    Neuropathy Mother    Pulmonary fibrosis Father        asbestosis    Past Surgical History:  Procedure Laterality Date   AMPUTATION Left 04/11/2013    Procedure: AMPUTATION DIGIT Left 3rd toe;  Surgeon: [name], MD;  Location: [facility];  Service: Orthopedics;  Laterality: Left;  Left 3rd toe amputation at [facility]   AMPUTATION Left 08/29/2019   Procedure: LEFT TRANSMETATARSAL AMPUTATION;  Surgeon: [name], MD;  Location: [facility];  Service: Orthopedics;  Laterality: Left;   BIOPSY  05/04/2020   Procedure: BIOPSY;  Surgeon: Jackquline Denmark, MD;  Location: [name] ENDOSCOPY;  Service: Endoscopy;;   [place]; 03/10/16   1997 Non-obstructive disease.  03/2016 BMS to RCA, with 25% pDiag dz, o/w normal cors per cath 03/07/16.  Cath 11/01/16: in stent restenosis, successful baloon angioplasty. 08/2020 branch vessel dz, cont medical therapy rec'd.   CARDIAC CATHETERIZATION  12/24/2012   mild < 20% LCx, prox 30% RCA; LVEF 55-65% , moderate pulmonary HTN, moderate AS   CARDIAC DEFIBRILLATOR PLACEMENT  11/07/2017   [facility] MRI Quad CRT defibrillator   CARDIOVASCULAR STRESS TEST  05/11/16 ([facility])   2017 Myocardial perfusion imaging:  No ischemia; scar in apex, global hypokinesis, EF 36%.  06/15/20->mod primarily fixed inferolat wall defect mildly worse with stress c/w infarct/scar with mild peri-infarct ischemia, normal EF-->for cath per cards.   Carotid dopplers  03/09/2016   [facility]: no hemodynamically significant stenosis on either side.   CHOLECYSTECTOMY     COLONOSCOPY [name] 10/30/2012   Procedure: COLONOSCOPY;  Surgeon: [name], MD;  Location: [facility] ENDOSCOPY;  Service: Endoscopy;  Laterality: [name];   CORONARY ANGIOPLASTY WITH STENT PLACEMENT  03/2016; 04/2017   2017-[facility]: BMS to RCA-pt was placed on Brilinta.  04/2017: DES to RCA.   DORSAL SLIT [name] 10/29/2019   for severe phimosis. Procedure: DORSAL SLIT;  Surgeon: Alexis Frock, MD;  Location: [facility] ORS;  Service: Urology;  Laterality: [name];  [place]  05/04/2020   Procedure: ESOPHAGEAL DILATION;  Surgeon: Jackquline Denmark, MD;  Location: [facility] ENDOSCOPY;  Service: Endoscopy;;    ESOPHAGOGASTRODUODENOSCOPY (EGD) WITH PROPOFOL [name] 05/04/2020  Procedure: ESOPHAGOGASTRODUODENOSCOPY (EGD) WITH PROPOFOL;  Surgeon: Jackquline Denmark, MD;  Location: [name] ENDOSCOPY;  Service: Endoscopy;  Laterality: [name];   EYE SURGERY Bilateral cataract   HEMORRHOID SURGERY     INTRAOCULAR LENS INSERTION Bilateral    KNEE SURGERY Right    LEFT AND RIGHT HEART CATHETERIZATION WITH CORONARY ANGIOGRAM [name] 12/24/2012   Procedure: LEFT AND RIGHT HEART CATHETERIZATION WITH CORONARY ANGIOGRAM;  Surgeon: [name], MD;  Location: [facility] CATH LAB;  Service: Cardiovascular;  Laterality: [name];   LEG SURGERY Bilateral    lenghtening    PACEMAKER PLACEMENT  04/13/2016   2nd deg HB after TAVR, pt had DC MDT PPM placed.   SHOULDER ARTHROSCOPY  08/30/2011   Procedure: ARTHROSCOPY SHOULDER;  Surgeon: [name], MD;  Location: [facility];  Service: Orthopedics;  Laterality: Right;  Right Shoulder Arthroscopy, Debridement, and Decompression   SPINAL CORD STIMULATOR INSERTION [name] 09/10/2015   Procedure: LUMBAR SPINAL CORD STIMULATOR INSERTION;  Surgeon: [name], MD;  Location: [facility] NEURO ORS;  Service: Neurosurgery;  Laterality: [name];   TEE WITHOUT CARDIOVERSION [name] 09/16/2019   Procedure: TRANSESOPHAGEAL ECHOCARDIOGRAM (TEE);  Surgeon: Skeet Latch, MD;  Location: [facility] ENDOSCOPY;  Service: Cardiovascular;  Laterality: [name];   TEE WITHOUT CARDIOVERSION [name] 12/02/2019   +vegetation on AVR and pacer lead in RV.  EF 55-60%, normal wall motion.  Valves function normal.  Procedure: TRANSESOPHAGEAL ECHOCARDIOGRAM (TEE);  Surgeon: Geralynn Rile, MD;  Location: [facility];  Service: Cardiovascular;  Laterality: [name];   TOE AMPUTATION Left    due to osteomyelitis.  R big toe surg due to osteoarth   TONSILLECTOMY     traeculectomy Left    eye   TRANSCATHETER AORTIC VALVE REPLACEMENT, TRANSFEMORAL  04/11/2016   TRANSESOPHAGEAL ECHOCARDIOGRAM  03/09/2016; 09/2019   [facility]: EF 55-60%, PFO seen with bi-directional shunting,  no thrombus in appendage.  09/2019 ->no valvular vegetations. Small patent foramen ovale with predominantly left to right shunting across the interatrial septum.   TRANSTHORACIC ECHOCARDIOGRAM  01/2015; 01/2016; 05/18/16; 09/18/16, 05/2017, 08/2017   01/2015 No signif change in aortic stenosis (moderate).  01/2016 Severe LVH w/small LV cavity, EF 60-65%, grade I diast dysfxn.  05/2016 (s/p TAVR): EF 50-55%, grd I DD, biopros AV good.  08/2016--EF 50-55%, LV septal motion c/w conduction abnl, grd I DD,mild MS,bioprosth aortic valve well seated, w/trace AR. 05/2017 TTE EF 35%. 08/2017-EF 35%, mod diff hypokin LV, grd I DD, biopros AV good.    TRANSTHORACIC ECHOCARDIOGRAM  04/2018; 09/2019   04/2018: EF 40-45%, mod diffuse LV hypokinesis, grd I DD, bioprosth AV well seated, no AS or AR. 09/2019 EF 60-65%, grd I DD, valves fine, including bioprosth AV. 04/29/20 (tech diff) EF 55-60%, grd I DD, vegetation on MV.   VITRECTOMY     Social History   Occupational History   Occupation: Engineer, production    Comment: retired  Tobacco Use   Smoking status: Never   Smokeless tobacco: Never  Vaping Use   Vaping Use: Never used  Substance and Sexual Activity   Alcohol use: No    Alcohol/week: 0.0 standard drinks   Drug use: No    Types: Oxycodone   Sexual activity: Not Currently

## 2021-06-03 ENCOUNTER — Other Ambulatory Visit: Payer: Self-pay | Admitting: Infectious Disease

## 2021-06-16 ENCOUNTER — Ambulatory Visit: Payer: Medicare Other | Admitting: Orthopedic Surgery

## 2021-06-21 ENCOUNTER — Ambulatory Visit: Payer: Medicare Other | Admitting: Orthopedic Surgery

## 2021-06-30 ENCOUNTER — Ambulatory Visit (INDEPENDENT_AMBULATORY_CARE_PROVIDER_SITE_OTHER): Payer: Medicare Other | Admitting: Orthopedic Surgery

## 2021-06-30 ENCOUNTER — Other Ambulatory Visit: Payer: Self-pay

## 2021-06-30 DIAGNOSIS — Z89432 Acquired absence of left foot: Secondary | ICD-10-CM

## 2021-06-30 DIAGNOSIS — L97521 Non-pressure chronic ulcer of other part of left foot limited to breakdown of skin: Secondary | ICD-10-CM

## 2021-07-05 ENCOUNTER — Other Ambulatory Visit: Payer: Self-pay

## 2021-07-05 ENCOUNTER — Ambulatory Visit (INDEPENDENT_AMBULATORY_CARE_PROVIDER_SITE_OTHER): Payer: Medicare Other | Admitting: Orthopedic Surgery

## 2021-07-05 ENCOUNTER — Encounter: Payer: Self-pay | Admitting: Orthopedic Surgery

## 2021-07-05 DIAGNOSIS — L97929 Non-pressure chronic ulcer of unspecified part of left lower leg with unspecified severity: Secondary | ICD-10-CM

## 2021-07-05 DIAGNOSIS — Z89432 Acquired absence of left foot: Secondary | ICD-10-CM | POA: Diagnosis not present

## 2021-07-05 DIAGNOSIS — L97521 Non-pressure chronic ulcer of other part of left foot limited to breakdown of skin: Secondary | ICD-10-CM

## 2021-07-05 DIAGNOSIS — I87332 Chronic venous hypertension (idiopathic) with ulcer and inflammation of left lower extremity: Secondary | ICD-10-CM

## 2021-07-05 NOTE — Progress Notes (Signed)
Office Visit Note   Patient: Tim [name]           Date of Birth: 01-29-1941           MRN: [id number] Visit Date: 06/30/2021              Requested by: Tammi Sou, MD [address] [place],  [place] [address] PCP: Tammi Sou, MD  Chief Complaint  Patient presents with   Left Foot - Wound Check, Follow-up      HPI: Patient is seen in follow-up for venous insufficiency and ulceration left foot status post left transmetatarsal amputation.  Assessment & Plan: Visit Diagnoses:  1. History of transmetatarsal amputation of left foot ([place])   2. Non-pressure chronic ulcer of other part of left foot limited to breakdown of skin ([place])     Plan: We will rewrap the left leg for the venous insufficiency ulcer debrided.  Follow-Up Instructions: Return in about 1 week (around 07/07/2021).   Ortho Exam  [name], oriented, no adenopathy, well-dressed, normal affect, normal respiratory effort. Examination patient has increased swelling with venous insufficiency of the left leg there are new venous blisters on the left leg from swelling.  There is dependent redness in the left foot.  Patient has persistent ulceration on the left foot.  After informed consent a 10 blade knife was used to debride the skin and soft tissue back to bleeding viable granulation tissue.  Silver nitrate was used for hemostasis the ulcer is 1 cm diameter prior to debridement after debridement it was 2 x 4 cm in diameter and 2 mm deep this is the best the wound has looked with healthy granulation tissue no exposed bone or tendon.  Imaging: No results found. No images are attached to the encounter.  Labs: Lab Results  Component Value Date   HGBA1C 6.2 05/11/2021   HGBA1C 6.0 02/09/2021   HGBA1C 5.5 11/01/2020   HGBA1C 5.5 11/01/2020   HGBA1C 5.5 (A) 11/01/2020   HGBA1C 5.5 11/01/2020   ESRSEDRATE 34 (H) 11/24/2020   ESRSEDRATE 38 (H) 07/14/2020   ESRSEDRATE 20 02/09/2016   CRP 1.3  11/24/2020   CRP 0.9 07/14/2020   CRP 0.7 05/03/2020   LABURIC 3.5 (L) 09/10/2013   REPTSTATUS 05/04/2020 FINAL 04/28/2020   CULT  04/28/2020    NO GROWTH 5 DAYS Performed at [facility], [place] [address], [place], [place] [address]    LABORGA ENTEROCOCCUS FAECALIS 09/11/2019     Lab Results  Component Value Date   ALBUMIN 3.6 11/01/2020   ALBUMIN 3.0 (L) 05/07/2020   ALBUMIN 2.9 (L) 05/06/2020    Lab Results  Component Value Date   MG 1.7 05/07/2020   MG 1.6 (L) 05/06/2020   MG 1.8 05/05/2020   No results found for: VD25OH  No results found for: PREALBUMIN CBC EXTENDED Latest Ref Rng & Units 05/11/2021 07/14/2020 05/07/2020  WBC 4.0 - 10.5 K/uL 6.1 7.2 5.5  RBC 4.22 - 5.81 Mil/uL 4.10(L) 4.11(L) 3.54(L)  HGB 13.0 - 17.0 g/dL 12.2(L) 12.2(L) 10.2(L)  HCT 39.0 - 52.0 % 37.0(L) 36.7(L) 32.1(L)  PLT 150.0 - 400.0 K/uL 122.0(L) 121(L) 133(L)  NEUTROABS 1,500 - 7,800 cells/uL - 4,694 3.0  LYMPHSABS 850 - 3,900 cells/uL - 1,390 1.6     There is no height or weight on file to calculate BMI.  Orders:  No orders of the defined types were placed in this encounter.  No orders of the  defined types were placed in this encounter.    Procedures: No procedures performed  Clinical Data: No additional findings.  ROS:  All other systems negative, except as noted in the HPI. Review of Systems  Objective: Vital Signs: There were no vitals taken for this visit.  Specialty Comments:  No specialty comments available.  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    ICD (implantable cardioverter-defibrillator) infection ([place]) 12/22/2019   Gram-positive bacteremia 11/27/2019   FUO (fever of unknown origin) 11/26/2019   Low blood pressure reading 11/05/2019   Wound dehiscence 10/22/2019   PFO (patent foramen ovale) 10/06/2019   ICD (implantable cardioverter-defibrillator) battery depletion    Pressure injury of skin 09/13/2019   Enterococcal bacteremia history  09/13/2019   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   Urinary retention 09/13/2019   Phimosis 09/13/2019   SIRS (systemic inflammatory response syndrome) ([place]) 09/12/2019   Sepsis ([place]) 09/12/2019   Enterococcus UTI 09/12/2019   Cellulitis of foot    History of transmetatarsal amputation of left foot (HCC)    Subacute osteomyelitis, left ankle and foot ([place])    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 ([place]) 04/17/2018   Diabetic neuropathy, painful ([place]) 12/26/2016   Neuropathic pain of foot, left 06/20/2016   Insulin resistance 07/22/2015   Status post amputation of left great toe ([place]) 06/08/2015   Spinal stenosis of lumbar region 02/16/2015   Hereditary and idiopathic peripheral neuropathy 01/12/2015   Oral thrush 12/28/2014   Orthostatic dizziness 05/24/2014   Paresthesias with subjective weakness 01/30/2014   Bilateral thigh pain 01/30/2014   Abdominal wall pain in right upper quadrant 01/22/2014   Gross hematuria 09/25/2013   Intertrigo 09/25/2013   IBS (irritable bowel syndrome) 09/11/2013   Chronic pain syndrome 08/06/2013   CAD (coronary artery disease), native coronary artery 12/25/2012   Paroxysmal atrial fibrillation ([place]) 12/25/2012   Moderate aortic stenosis 12/25/2012   Aortic stenosis 12/24/2012   Personal history of colonic adenoma 10/30/2012   Diverticulosis of colon (without mention of hemorrhage) 10/30/2012   Internal  hemorrhoids 10/30/2012   Change in bowel habits intermittent loose stools 10/30/2012   Routine health maintenance 06/09/2012   Hereditary peripheral neuropathy 10/10/2011   Long term current use of anticoagulant - apixiban 08/16/2010   Dyspnea on exertion 09/08/2009   HYPERCHOLESTEROLEMIA-PURE 01/13/2009   Essential hypertension, benign 01/13/2009   EDEMA 01/13/2009   GOUT 01/12/2009   GLAUCOMA 01/12/2009   Venous insufficiency (chronic) (peripheral) 01/12/2009   COPD (chronic obstructive pulmonary disease) ([place]) 01/12/2009   VENTRAL HERNIA 01/12/2009   OSTEOARTHRITIS 01/12/2009   ARTHRITIS 01/12/2009   DEGENERATIVE DISC DISEASE 01/12/2009   CATARACT, HX OF 01/12/2009   HEART MURMUR, HX OF 01/12/2009   BENIGN PROSTATIC HYPERTROPHY, HX OF 01/12/2009   Obesity 01/17/2007   OBSTRUCTIVE SLEEP APNEA 01/17/2007   ASTHMA 01/17/2007   GERD 01/17/2007   HALLUX VALGUS, ACQUIRED 01/17/2007   Pulmonary hypertension ([place]) 01/17/2007   Past Medical History:  Diagnosis Date   AICD (  automatic cardioverter/defibrillator) present    Balanitis    +severe phimosis+ buried penis->circ not possible but dorsal slit done 10/2019   BPH (benign prostatic hypertrophy)    with urinary retention.  Renal u/s 12/04/19 NORMAL KIDNEYS, NORMAL BLADDER, NO HYDRONEPHROSIS   CAD (coronary artery disease)    Nonobstructive by 12/2012 cath; then 03/2016 he required BMS to RCA ([facility]).  In-stent restenosis on cath 11/01/16, baloon angioplasty successful.   CATARACT, HX OF    Chronic combined systolic and diastolic CHF (congestive heart failure) ([place]) 05/2016   Ischemic CM.  04/2018 EF 40-45%, grd I DD.   Chronic pain syndrome    Lumbar DDD; chronic neuropathic pain (DM); has spinal stimulator and sees pain mgmt MD   Complicated UTI (urinary tract infection) 09/2019   Phimoses, acute urinary retention, entoerococcus UTI, enterococc bacteremia.  F/u blood clx's neg x 5d.     COPD    Diabetes mellitus type 2 with  complications ([place])    ZES9Q jumped from 5.7% to 6.1% 03/2015---started metformin at that time.  DM 2 dx by fasting gluc criteria 2018.  Has chronic neuropath pain   Enterococcal bacteremia 09/2019   Phimoses, acute urinary retention, entoerococcus UTI, enterococc bacteremia.  F/u blood clx's neg x 5d.     Essential hypertension, benign    Fatty liver 2007   2007 u/s showed fatty liver with hepatosplenomegaly.  2019 repeat u/s->fatty liver but no cirrhosis or hepatosplenomegaly.   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 Tommy Medal)   Lumbosacral neuritis    Lumbosacral spondylosis    Lumbar spinal stenosis with neurogenic claudication--contributes to his chronic pain syndrome   Normal memory function 08/2014   Neuropsychological testing ([facility]): no cognitive impairment or sign of neurodegenerative disorder.  Likely has adjustment d/o with mixed anxiety/depressed features and may benefit from low dose SNRI.     Normocytic anemia 03/2016   Mild-pt needs ferritin and vit B12 level checked (as of 03/22/16). Hb stable 09/2019   NSTEMI (non-ST elevated myocardial infarction) ([place]) 03/20/2016   BMS to RCA   Obesity hypoventilation syndrome (HCC)    Orthostatic hypotension    OSA on CPAP    8 cm H2O   OSTEOARTHRITIS    PAF (paroxysmal atrial fibrillation) (HCC)    Paroxysmal atrial fibrillation ([place]) 2003    (? chronic?) Off anticoag for a while due to falls.  Then apixaban started 12/2014.   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 Ella Bodo): neuropathic pain->diff to treat, failed nucynta, failed spinal  stimul trial, oxycontin hs + tramadol + gabap as of 12/2017 f/u Dr. Letta Pate.   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   Sick sinus syndrome (HCC)    PPM placed   Thrombocytopenia ([place]) 2018   HSM on 2007 abd u/s---suspect some mild splenic sequestration chronically.   Unspecified glaucoma(365.9)    Unspecified hereditary and idiopathic peripheral neuropathy approx age 27   bilat LE's, ? left arm, too.  Feet became progressively numb + left foot pain intermittently.  Pt may be trying a spinal stimulator (as of 05/2015)   VENOUS INSUFFICIENCY    Being followed by Dr. Sharol Given as of 10/2016 for two R LL venous stasis ulcers/skin tears.  Healed as of 10/30/16 f/u with Dr. Sharol Given.   VENTRAL HERNIA    Weakness 02/23/2021   Wound dehiscence 10/22/2019    Family History  Problem Relation Age of Onset   Hypertension Mother    Coronary artery disease Mother    Heart attack Mother    Neuropathy Mother    Pulmonary fibrosis Father        asbestosis    Past Surgical History:  Procedure Laterality Date   AMPUTATION Left 04/11/2013   Procedure: AMPUTATION DIGIT Left 3rd toe;  Surgeon: [name], MD;  Location: [facility];  Service: Orthopedics;  Laterality: Left;  Left 3rd toe amputation at [facility]   AMPUTATION  Left 08/29/2019   Procedure: LEFT TRANSMETATARSAL AMPUTATION;  Surgeon: [name], MD;  Location: [facility];  Service: Orthopedics;  Laterality: Left;   BIOPSY  05/04/2020   Procedure: BIOPSY;  Surgeon: Jackquline Denmark, MD;  Location[facility] ENDOSCOPY;  Service: Endoscopy;;   [place]; 03/10/16   1997 Non-obstructive disease.  03/2016 BMS to RCA, with 25% pDiag dz, o/w normal cors per cath 03/07/16.  Cath 11/01/16: in stent restenosis, successful baloon angioplasty. 08/2020 branch vessel dz, cont medical therapy rec'd.   CARDIAC CATHETERIZATION  12/24/2012   mild < 20% LCx, prox 30% RCA; LVEF 55-65% , moderate pulmonary HTN, moderate AS   CARDIAC DEFIBRILLATOR PLACEMENT  11/07/2017   [facility] MRI Quad CRT defibrillator   CARDIOVASCULAR STRESS TEST  05/11/16 ([facility])   2017 Myocardial perfusion imaging:  No ischemia; scar in apex, global hypokinesis, EF 36%.  06/15/20->mod primarily fixed inferolat wall defect mildly worse with stress c/w infarct/scar with mild peri-infarct ischemia, normal EF-->for cath per cards.   Carotid dopplers  03/09/2016   [facility]: no hemodynamically significant stenosis on either side.   CHOLECYSTECTOMY     COLONOSCOPY [name] 10/30/2012   Procedure: COLONOSCOPY;  Surgeon: [name], MD;  Location: [facility] ENDOSCOPY;  Service: Endoscopy;  Laterality: [name];   CORONARY ANGIOPLASTY WITH STENT PLACEMENT  03/2016; 04/2017   2017-[facility]: BMS to RCA-pt was placed on Brilinta.  04/2017: DES to RCA.   DORSAL SLIT [name] 10/29/2019   for severe phimosis. Procedure: DORSAL SLIT;  Surgeon: Alexis Frock, MD;  Location: [facility] ORS;  Service: Urology;  Laterality: [name];  [place]  05/04/2020   Procedure: ESOPHAGEAL DILATION;  Surgeon: Jackquline Denmark, MD;  Location: [facility] ENDOSCOPY;  Service: Endoscopy;;   ESOPHAGOGASTRODUODENOSCOPY (EGD) WITH PROPOFOL [name] 05/04/2020   Procedure: ESOPHAGOGASTRODUODENOSCOPY (EGD) WITH PROPOFOL;  Surgeon: Jackquline Denmark, MD;  Location: [facility] ENDOSCOPY;   Service: Endoscopy;  Laterality: [name];   EYE SURGERY Bilateral cataract  HEMORRHOID SURGERY     INTRAOCULAR LENS INSERTION Bilateral    KNEE SURGERY Right    LEFT AND RIGHT HEART CATHETERIZATION WITH CORONARY ANGIOGRAM [name] 12/24/2012   Procedure: LEFT AND RIGHT HEART CATHETERIZATION WITH CORONARY ANGIOGRAM;  Surgeon: [name], MD;  Location: [facility] CATH LAB;  Service: Cardiovascular;  Laterality: [name];   LEG SURGERY Bilateral    lenghtening    PACEMAKER PLACEMENT  04/13/2016   2nd deg HB after TAVR, pt had DC MDT PPM placed.   SHOULDER ARTHROSCOPY  08/30/2011   Procedure: ARTHROSCOPY SHOULDER;  Surgeon: [name], MD;  Location: [facility];  Service: Orthopedics;  Laterality: Right;  Right Shoulder Arthroscopy, Debridement, and Decompression   SPINAL CORD STIMULATOR INSERTION [name] 09/10/2015   Procedure: LUMBAR SPINAL CORD STIMULATOR INSERTION;  Surgeon: [name], MD;  Location: [facility] NEURO ORS;  Service: Neurosurgery;  Laterality: [name];   TEE WITHOUT CARDIOVERSION [name] 09/16/2019   Procedure: TRANSESOPHAGEAL ECHOCARDIOGRAM (TEE);  Surgeon: Skeet Latch, MD;  Location: [facility] ENDOSCOPY;  Service: Cardiovascular;  Laterality: [name];   TEE WITHOUT CARDIOVERSION [name] 12/02/2019   +vegetation on AVR and pacer lead in RV.  EF 55-60%, normal wall motion.  Valves function normal.  Procedure: TRANSESOPHAGEAL ECHOCARDIOGRAM (TEE);  Surgeon: Geralynn Rile, MD;  Location: [facility];  Service: Cardiovascular;  Laterality: [name];   TOE AMPUTATION Left    due to osteomyelitis.  R big toe surg due to osteoarth   TONSILLECTOMY     traeculectomy Left    eye   TRANSCATHETER AORTIC VALVE REPLACEMENT, TRANSFEMORAL  04/11/2016   TRANSESOPHAGEAL ECHOCARDIOGRAM  03/09/2016; 09/2019   [facility]: EF 55-60%, PFO seen with bi-directional shunting, no thrombus in appendage.  09/2019 ->no valvular vegetations. Small patent foramen ovale with predominantly left to right shunting across the interatrial septum.    TRANSTHORACIC ECHOCARDIOGRAM  01/2015; 01/2016; 05/18/16; 09/18/16, 05/2017, 08/2017   01/2015 No signif change in aortic stenosis (moderate).  01/2016 Severe LVH w/small LV cavity, EF 60-65%, grade I diast dysfxn.  05/2016 (s/p TAVR): EF 50-55%, grd I DD, biopros AV good.  08/2016--EF 50-55%, LV septal motion c/w conduction abnl, grd I DD,mild MS,bioprosth aortic valve well seated, w/trace AR. 05/2017 TTE EF 35%. 08/2017-EF 35%, mod diff hypokin LV, grd I DD, biopros AV good.    TRANSTHORACIC ECHOCARDIOGRAM  04/2018; 09/2019   04/2018: EF 40-45%, mod diffuse LV hypokinesis, grd I DD, bioprosth AV well seated, no AS or AR. 09/2019 EF 60-65%, grd I DD, valves fine, including bioprosth AV. 04/29/20 (tech diff) EF 55-60%, grd I DD, vegetation on MV.   VITRECTOMY     Social History   Occupational History   Occupation: Engineer, production    Comment: retired  Tobacco Use   Smoking status: Never   Smokeless tobacco: Never  Vaping Use   Vaping Use: Never used  Substance and Sexual Activity   Alcohol use: No    Alcohol/week: 0.0 standard drinks   Drug use: No    Types: Oxycodone   Sexual activity: Not Currently

## 2021-07-05 NOTE — Progress Notes (Signed)
Office Visit Note   Patient: Tim [name]           Date of Birth: Jan 18, 1941           MRN: [id number] Visit Date: 07/05/2021              Requested by: Tammi Sou, MD [address] [place],  [place] [address] PCP: Tammi Sou, MD  Chief Complaint  Patient presents with   Left Foot - Wound Check      HPI: Patient is an 81 year old gentleman who presents with venous insufficiency ulceration left leg and ulceration left transmetatarsal amputation.  Patient was placed in a 3 layer compression wrap last visit.  Patient states he had to take off the compression wrap layer by layer immediately after the last visit stating that it was secondary to neuropathy pain.  Patient states that with the increased swelling he has had increased bleeding from the left foot.  Assessment & Plan: Visit Diagnoses:  1. History of transmetatarsal amputation of left foot ([place])   2. Non-pressure chronic ulcer of other part of left foot limited to breakdown of skin ([place])   3. Chronic venous hypertension (idiopathic) with ulcer and inflammation of left lower extremity (HCC)     Plan: Will have patient wash his foot and leg with soap and water daily apply a 4 x 4 dressing plus Ace wrap and change daily.  Follow-Up Instructions: Return in about 2 weeks (around 07/19/2021).   Ortho Exam  [name], oriented, no adenopathy, well-dressed, normal affect, normal respiratory effort. Examination patient has persistent venous stasis swelling ulceration left leg there is no drainage no odor.  Examination the plantar ulcer of the transmetatarsal amputation is better there is no drainage.  Patient has had some increased swelling on the dorsum of his foot has developed a blister ulcer from the swelling on the dorsum of his foot.  There is no active bleeding at this time.  Imaging: No results found. No images are attached to the encounter.  Labs: Lab Results  Component Value Date   HGBA1C  6.2 05/11/2021   HGBA1C 6.0 02/09/2021   HGBA1C 5.5 11/01/2020   HGBA1C 5.5 11/01/2020   HGBA1C 5.5 (A) 11/01/2020   HGBA1C 5.5 11/01/2020   ESRSEDRATE 34 (H) 11/24/2020   ESRSEDRATE 38 (H) 07/14/2020   ESRSEDRATE 20 02/09/2016   CRP 1.3 11/24/2020   CRP 0.9 07/14/2020   CRP 0.7 05/03/2020   LABURIC 3.5 (L) 09/10/2013   REPTSTATUS 05/04/2020 FINAL 04/28/2020   CULT  04/28/2020    NO GROWTH 5 DAYS Performed at [facility], [place] [address], [place], [place] [address]    LABORGA ENTEROCOCCUS FAECALIS 09/11/2019     Lab Results  Component Value Date   ALBUMIN 3.6 11/01/2020   ALBUMIN 3.0 (L) 05/07/2020   ALBUMIN 2.9 (L) 05/06/2020    Lab Results  Component Value Date   MG 1.7 05/07/2020   MG 1.6 (L) 05/06/2020   MG 1.8 05/05/2020   No results found for: VD25OH  No results found for: PREALBUMIN CBC EXTENDED Latest Ref Rng & Units 05/11/2021 07/14/2020 05/07/2020  WBC 4.0 - 10.5 K/uL 6.1 7.2 5.5  RBC 4.22 - 5.81 Mil/uL 4.10(L) 4.11(L) 3.54(L)  HGB 13.0 - 17.0 g/dL 12.2(L) 12.2(L) 10.2(L)  HCT 39.0 - 52.0 % 37.0(L) 36.7(L) 32.1(L)  PLT 150.0 - 400.0 K/uL 122.0(L) 121(L) 133(L)  NEUTROABS 1,500 - 7,800 cells/uL - 4,694 3.0  LYMPHSABS  850 - 3,900 cells/uL - 1,390 1.6     There is no height or weight on file to calculate BMI.  Orders:  No orders of the defined types were placed in this encounter.  No orders of the defined types were placed in this encounter.    Procedures: No procedures performed  Clinical Data: No additional findings.  ROS:  All other systems negative, except as noted in the HPI. Review of Systems  Objective: Vital Signs: There were no vitals taken for this visit.  Specialty Comments:  No specialty comments available.  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   ICD (implantable cardioverter-defibrillator) infection ([place]) 12/22/2019   Gram-positive bacteremia 11/27/2019   FUO (fever of unknown origin) 11/26/2019   Low blood pressure reading 11/05/2019   Wound dehiscence 10/22/2019   PFO (patent foramen ovale) 10/06/2019   ICD (implantable cardioverter-defibrillator) battery depletion    Pressure injury of skin 09/13/2019   Enterococcal bacteremia history  09/13/2019   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   Urinary retention 09/13/2019   Phimosis 09/13/2019   SIRS (systemic inflammatory response syndrome) ([place]) 09/12/2019   Sepsis ([place]) 09/12/2019   Enterococcus UTI 09/12/2019   Cellulitis of foot    History of transmetatarsal amputation of left foot ([place])    Subacute osteomyelitis, left ankle and foot ([place])    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 ([place]) 04/17/2018   Diabetic neuropathy, painful ([place]) 12/26/2016   Neuropathic pain of foot, left 06/20/2016   Insulin resistance 07/22/2015   Status post amputation of left great toe ([place]) 06/08/2015   Spinal stenosis of lumbar region 02/16/2015   Hereditary and idiopathic peripheral neuropathy 01/12/2015   Oral thrush 12/28/2014   Orthostatic dizziness 05/24/2014   Paresthesias with subjective weakness 01/30/2014   Bilateral thigh pain 01/30/2014   Abdominal wall pain in right upper quadrant 01/22/2014   Gross hematuria 09/25/2013   Intertrigo 09/25/2013   IBS (irritable bowel syndrome) 09/11/2013   Chronic pain syndrome 08/06/2013   CAD (coronary artery disease), native coronary artery 12/25/2012    Paroxysmal atrial fibrillation ([place]) 12/25/2012   Moderate aortic stenosis 12/25/2012   Aortic stenosis 12/24/2012   Personal history of colonic adenoma 10/30/2012   Diverticulosis of colon (without mention of hemorrhage) 10/30/2012   Internal hemorrhoids 10/30/2012   Change in bowel habits intermittent loose stools 10/30/2012   Routine health maintenance 06/09/2012   Hereditary peripheral neuropathy 10/10/2011   Long term current use of anticoagulant - apixiban 08/16/2010   Dyspnea on exertion 09/08/2009   HYPERCHOLESTEROLEMIA-PURE 01/13/2009   Essential hypertension, benign 01/13/2009   EDEMA 01/13/2009   GOUT 01/12/2009   GLAUCOMA 01/12/2009   Venous insufficiency (chronic) (peripheral) 01/12/2009   COPD (chronic obstructive pulmonary disease) ([place]) 01/12/2009   VENTRAL HERNIA 01/12/2009   OSTEOARTHRITIS 01/12/2009   ARTHRITIS 01/12/2009   DEGENERATIVE DISC DISEASE 01/12/2009   CATARACT, HX OF 01/12/2009   HEART MURMUR, HX OF 01/12/2009   BENIGN PROSTATIC HYPERTROPHY, HX OF 01/12/2009  Obesity 01/17/2007   OBSTRUCTIVE SLEEP APNEA 01/17/2007   ASTHMA 01/17/2007   GERD 01/17/2007   HALLUX VALGUS, ACQUIRED 01/17/2007   Pulmonary hypertension ([place]) 01/17/2007   Past Medical History:  Diagnosis Date   AICD (automatic cardioverter/defibrillator) present    Balanitis    +severe phimosis+ buried penis->circ not possible but dorsal slit done 10/2019   BPH (benign prostatic hypertrophy)    with urinary retention.  Renal u/s 12/04/19 NORMAL KIDNEYS, NORMAL BLADDER, NO HYDRONEPHROSIS   CAD (coronary artery disease)    Nonobstructive by 12/2012 cath; then 03/2016 he required BMS to RCA ([facility]).  In-stent restenosis on cath 11/01/16, baloon angioplasty successful.   CATARACT, HX OF    Chronic combined systolic and diastolic CHF (congestive heart failure) ([place]) 05/2016   Ischemic CM.  04/2018 EF 40-45%, grd I DD.   Chronic pain syndrome    Lumbar DDD; chronic neuropathic pain (DM); has  spinal stimulator and sees pain mgmt MD   Complicated UTI (urinary tract infection) 09/2019   Phimoses, acute urinary retention, entoerococcus UTI, enterococc bacteremia.  F/u blood clx's neg x 5d.     COPD    Diabetes mellitus type 2 with complications ([place])    ZOX0R jumped from 5.7% to 6.1% 03/2015---started metformin at that time.  DM 2 dx by fasting gluc criteria 2018.  Has chronic neuropath pain   Enterococcal bacteremia 09/2019   Phimoses, acute urinary retention, entoerococcus UTI, enterococc bacteremia.  F/u blood clx's neg x 5d.     Essential hypertension, benign    Fatty liver 2007   2007 u/s showed fatty liver with hepatosplenomegaly.  2019 repeat u/s->fatty liver but no cirrhosis or hepatosplenomegaly.   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 Tommy Medal)   Lumbosacral neuritis    Lumbosacral spondylosis    Lumbar spinal stenosis with neurogenic claudication--contributes to his chronic pain syndrome   Normal memory function 08/2014   Neuropsychological testing ([facility]): no cognitive impairment or sign of neurodegenerative disorder.  Likely has adjustment d/o with mixed anxiety/depressed features and may benefit from low dose SNRI.     Normocytic anemia 03/2016   Mild-pt needs ferritin and vit B12 level checked (as of 03/22/16). Hb stable 09/2019   NSTEMI (non-ST elevated myocardial infarction) ([place]) 03/20/2016   BMS to RCA   Obesity hypoventilation syndrome (HCC)    Orthostatic hypotension    OSA on CPAP    8 cm H2O   OSTEOARTHRITIS    PAF (paroxysmal atrial fibrillation) (HCC)    Paroxysmal atrial fibrillation ([place]) 2003     (? chronic?) Off anticoag for a while due to falls.  Then apixaban started 12/2014.   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 Ella Bodo): neuropathic pain->diff to treat, failed nucynta, failed spinal stimul trial, oxycontin hs + tramadol + gabap as of 12/2017 f/u Dr. Letta Pate.   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   Sick sinus syndrome (HCC)    PPM placed   Thrombocytopenia ([place]) 2018   HSM on 2007 abd u/s---suspect some mild splenic sequestration chronically.   Unspecified glaucoma(365.9)    Unspecified hereditary and idiopathic peripheral neuropathy approx age 27   bilat LE's, ? left arm, too.  Feet became progressively numb + left foot pain intermittently.  Pt may be trying a spinal stimulator (as of 05/2015)   VENOUS INSUFFICIENCY    Being followed by Dr. Sharol Given as of 10/2016 for two R LL venous stasis ulcers/skin tears.  Healed as of 10/30/16 f/u with Dr. Sharol Given.   VENTRAL HERNIA    Weakness 02/23/2021   Wound dehiscence 10/22/2019    Family History  Problem Relation Age of Onset   Hypertension Mother    Coronary artery disease Mother    Heart attack Mother    Neuropathy Mother    Pulmonary fibrosis Father        asbestosis    Past Surgical History:   Procedure Laterality Date   AMPUTATION Left 04/11/2013   Procedure: AMPUTATION DIGIT Left 3rd toe;  Surgeon: [name], MD;  Location: [facility];  Service: Orthopedics;  Laterality: Left;  Left 3rd toe amputation at [facility]   AMPUTATION Left 08/29/2019   Procedure: LEFT TRANSMETATARSAL AMPUTATION;  Surgeon: [name], MD;  Location: [facility];  Service: Orthopedics;  Laterality: Left;   BIOPSY  05/04/2020   Procedure: BIOPSY;  Surgeon: Jackquline Denmark, MD;  Location: [name] ENDOSCOPY;  Service: Endoscopy;;   [place]; 03/10/16   1997 Non-obstructive disease.  03/2016 BMS to RCA, with 25% pDiag dz, o/w normal cors per cath 03/07/16.  Cath 11/01/16: in stent restenosis, successful baloon angioplasty. 08/2020 branch vessel dz, cont medical therapy rec'd.   CARDIAC CATHETERIZATION  12/24/2012   mild < 20% LCx, prox 30% RCA; LVEF 55-65% , moderate pulmonary HTN, moderate AS   CARDIAC DEFIBRILLATOR PLACEMENT  11/07/2017   [facility] MRI Quad CRT defibrillator   CARDIOVASCULAR STRESS TEST  05/11/16 ([facility])   2017 Myocardial perfusion imaging:  No ischemia; scar in apex, global hypokinesis, EF 36%.  06/15/20->mod primarily fixed inferolat wall defect mildly worse with stress c/w infarct/scar with mild peri-infarct ischemia, normal EF-->for cath per cards.   Carotid dopplers  03/09/2016   [facility]: no hemodynamically significant stenosis on either side.   CHOLECYSTECTOMY     COLONOSCOPY [name] 10/30/2012   Procedure: COLONOSCOPY;  Surgeon: [name], MD;  Location: [facility] ENDOSCOPY;  Service: Endoscopy;  Laterality: [name];   CORONARY ANGIOPLASTY WITH STENT PLACEMENT  03/2016; 04/2017   2017-[facility]: BMS to RCA-pt was placed on Brilinta.  04/2017: DES to RCA.   DORSAL SLIT [name] 10/29/2019   for severe phimosis. Procedure: DORSAL SLIT;  Surgeon: Alexis Frock, MD;  Location: [facility] ORS;  Service: Urology;  Laterality: [name];  [place]  05/04/2020   Procedure: ESOPHAGEAL DILATION;  Surgeon: Jackquline Denmark, MD;  Location: [facility];  Service:  Endoscopy;;   ESOPHAGOGASTRODUODENOSCOPY (EGD) WITH PROPOFOL [name] 05/04/2020   Procedure: ESOPHAGOGASTRODUODENOSCOPY (EGD) WITH PROPOFOL;  Surgeon: Jackquline Denmark, MD;  Location: [facility] ENDOSCOPY;  Service: Endoscopy;  Laterality: [name];   EYE SURGERY Bilateral cataract   HEMORRHOID SURGERY     INTRAOCULAR LENS INSERTION Bilateral    KNEE SURGERY Right    LEFT AND RIGHT HEART CATHETERIZATION WITH CORONARY ANGIOGRAM [name] 12/24/2012   Procedure: LEFT AND RIGHT HEART CATHETERIZATION WITH CORONARY ANGIOGRAM;  Surgeon: [name], MD;  Location: [facility] CATH LAB;  Service: Cardiovascular;  Laterality: [name];   LEG SURGERY Bilateral    lenghtening    PACEMAKER PLACEMENT  04/13/2016   2nd deg HB after TAVR, pt had DC MDT PPM placed.   SHOULDER ARTHROSCOPY  08/30/2011   Procedure: ARTHROSCOPY SHOULDER;  Surgeon: [name], MD;  Location: [facility];  Service: Orthopedics;  Laterality: Right;  Right Shoulder Arthroscopy, Debridement, and Decompression   SPINAL CORD STIMULATOR INSERTION [name] 09/10/2015   Procedure: LUMBAR SPINAL CORD STIMULATOR INSERTION;  Surgeon: [name], MD;  Location: [facility] NEURO ORS;  Service: Neurosurgery;  Laterality: [name];   TEE WITHOUT CARDIOVERSION [name] 09/16/2019   Procedure: TRANSESOPHAGEAL ECHOCARDIOGRAM (TEE);  Surgeon: Skeet Latch, MD;  Location: [facility] ENDOSCOPY;  Service: Cardiovascular;  Laterality: [name];   TEE WITHOUT CARDIOVERSION [name] 12/02/2019   +vegetation on AVR and pacer lead in RV.  EF 55-60%, normal wall motion.  Valves function normal.  Procedure: TRANSESOPHAGEAL ECHOCARDIOGRAM (TEE);  Surgeon: Geralynn Rile, MD;  Location: [facility];  Service: Cardiovascular;  Laterality: [name];   TOE AMPUTATION Left    due to osteomyelitis.  R big toe surg due to osteoarth   TONSILLECTOMY     traeculectomy Left    eye   TRANSCATHETER AORTIC VALVE REPLACEMENT, TRANSFEMORAL  04/11/2016   TRANSESOPHAGEAL ECHOCARDIOGRAM  03/09/2016;  09/2019   [facility]: EF 55-60%, PFO seen with bi-directional shunting, no thrombus in appendage.  09/2019 ->no valvular vegetations. Small patent foramen ovale with predominantly left to right shunting across the interatrial septum.   TRANSTHORACIC ECHOCARDIOGRAM  01/2015; 01/2016; 05/18/16; 09/18/16, 05/2017, 08/2017   01/2015 No signif change in aortic stenosis (moderate).  01/2016 Severe LVH w/small LV cavity, EF 60-65%, grade I diast dysfxn.  05/2016 (s/p TAVR): EF 50-55%, grd I DD, biopros AV good.  08/2016--EF 50-55%, LV septal motion c/w conduction abnl, grd I DD,mild MS,bioprosth aortic valve well seated, w/trace AR. 05/2017 TTE EF 35%. 08/2017-EF 35%, mod diff hypokin LV, grd I DD, biopros AV good.    TRANSTHORACIC ECHOCARDIOGRAM  04/2018; 09/2019   04/2018: EF 40-45%, mod diffuse LV hypokinesis, grd I DD, bioprosth AV well seated, no AS or AR. 09/2019 EF 60-65%, grd I DD, valves fine, including bioprosth AV. 04/29/20 (tech diff) EF 55-60%, grd I DD, vegetation on MV.   VITRECTOMY     Social History   Occupational History   Occupation: Engineer, production    Comment: retired  Tobacco Use   Smoking status: Never   Smokeless tobacco: Never  Vaping Use   Vaping Use: Never used  Substance and Sexual Activity   Alcohol use: No    Alcohol/week: 0.0 standard drinks   Drug use: No    Types: Oxycodone   Sexual activity: Not Currently

## 2021-07-21 ENCOUNTER — Ambulatory Visit: Payer: Medicare Other | Admitting: Orthopedic Surgery

## 2021-07-23 LAB — CBC AND DIFFERENTIAL
HCT: 27 — AB (ref 41–53)
Hemoglobin: 9.1 — AB (ref 13.5–17.5)
Platelets: 112 — AB (ref 150–399)
WBC: 7.1

## 2021-07-23 LAB — BASIC METABOLIC PANEL
BUN: 19 (ref 4–21)
CO2: 25 — AB (ref 13–22)
Chloride: 102 (ref 99–108)
Creatinine: 0.6 (ref 0.6–1.3)
Potassium: 4.1 (ref 3.4–5.3)
Sodium: 139 (ref 137–147)

## 2021-07-23 LAB — COMPREHENSIVE METABOLIC PANEL: Calcium: 9.1 (ref 8.7–10.7)

## 2021-07-29 ENCOUNTER — Other Ambulatory Visit: Payer: Self-pay | Admitting: Family Medicine

## 2021-08-10 ENCOUNTER — Telehealth: Payer: Self-pay | Admitting: Family Medicine

## 2021-08-10 NOTE — Telephone Encounter (Signed)
Yes okay 

## 2021-08-10 NOTE — Telephone Encounter (Signed)
Caller Name: Elias Else OT w/[facility] Call back phone #: [phone]  Reason for Call: [name] OT eval completed today. Requesting to continue OT for 1x for 9 weeks starting today.

## 2021-08-10 NOTE — Telephone Encounter (Signed)
[place] for v/o?

## 2021-08-11 ENCOUNTER — Ambulatory Visit: Payer: Medicare Other | Admitting: Family Medicine

## 2021-08-11 NOTE — Telephone Encounter (Signed)
Spoke with [place], approval orders given.

## 2021-08-14 ENCOUNTER — Other Ambulatory Visit: Payer: Self-pay | Admitting: Family Medicine

## 2021-08-18 ENCOUNTER — Telehealth: Payer: Self-pay | Admitting: Family Medicine

## 2021-08-18 NOTE — Telephone Encounter (Signed)
FYI  Please see below

## 2021-08-18 NOTE — Telephone Encounter (Signed)
Leda Gauze PT with [facility] Ph# [phone]  Nofication that pt cancelled PT appt today due to expecting a visitor. Pt is rescheduled for 08/23/2021. No call back needed.

## 2021-08-24 ENCOUNTER — Telehealth: Payer: Self-pay | Admitting: Family Medicine

## 2021-08-24 NOTE — Telephone Encounter (Signed)
Okay for orders?

## 2021-08-24 NOTE — Telephone Encounter (Signed)
Yes ok but I should see him for his wound sometime in the next 2 wks.-thx

## 2021-08-24 NOTE — Telephone Encounter (Signed)
[facility] [phone] requested a verbal order to allow a verbal order from Dr Raelyn Ensign to care for his wound

## 2021-08-25 NOTE — Telephone Encounter (Signed)
Ok,virtual ok

## 2021-08-25 NOTE — Telephone Encounter (Signed)
Pt states that it is hard for him to get out of the house because of the 4 steps that he has to climb. Insurance will not cover ramp for pt and he would need to have non emergency EMT for transport which cost him $1000 every time. Pt is requesting to do appt by vv or some way around him having to leave his house.

## 2021-08-25 NOTE — Telephone Encounter (Signed)
Pt scheduled for 09/09/21

## 2021-09-01 ENCOUNTER — Other Ambulatory Visit: Payer: Self-pay | Admitting: Family Medicine

## 2021-09-01 ENCOUNTER — Ambulatory Visit: Payer: Medicare Other | Admitting: Infectious Disease

## 2021-09-01 NOTE — Telephone Encounter (Signed)
Signed and put in box to go up front. ?Signed:  Crissie Sickles, MD           09/01/2021 ? ?

## 2021-09-01 NOTE — Telephone Encounter (Signed)
Home health PT/OT orders received 09/01/21 for Tim [name] ?Home health initiation orders: Yes.  ?Home health re-certification orders: No. ?Patient last seen by ordering physician for this condition: 05/11/21. Must be less than 90 days for re-certification and less than 30 days prior for initiation. Visit must have been for the condition the orders are being placed.  ?Patient meets criteria for Physician to sign orders: Yes.  ?      Current med list has been attached: Yes  ?      Orders placed on physicians desk for signature: 09/01/21 (date) ?If patient does not meet criteria for orders to be signed: pt was called to schedule appt. Appt is scheduled for [name].  ? ?Tim [name] ? ?

## 2021-09-09 ENCOUNTER — Encounter: Payer: Self-pay | Admitting: Family Medicine

## 2021-09-09 ENCOUNTER — Encounter: Payer: Medicare Other | Admitting: Family Medicine

## 2021-09-09 ENCOUNTER — Other Ambulatory Visit: Payer: Self-pay

## 2021-09-13 ENCOUNTER — Telehealth: Payer: Self-pay | Admitting: Family Medicine

## 2021-09-13 NOTE — Telephone Encounter (Signed)
LVM for pt to CB and schedule Medicare Annual Wellness Visit (AWV).   ?

## 2021-09-13 NOTE — Telephone Encounter (Signed)
Tim [name] (PT) [phone] ? Called needing verbal order  to continue Physical Therapy on Wednesdays  ? ?1 time a  week for 4 weeks. ?

## 2021-09-13 NOTE — Telephone Encounter (Signed)
Okay for orders? ?

## 2021-09-14 NOTE — Telephone Encounter (Signed)
Yes okay 

## 2021-09-14 NOTE — Telephone Encounter (Signed)
Approved VO given to [place]. ?

## 2021-09-15 ENCOUNTER — Telehealth (INDEPENDENT_AMBULATORY_CARE_PROVIDER_SITE_OTHER): Payer: Medicare Other | Admitting: Family Medicine

## 2021-09-15 ENCOUNTER — Encounter: Payer: Self-pay | Admitting: Family Medicine

## 2021-09-15 ENCOUNTER — Other Ambulatory Visit: Payer: Self-pay | Admitting: Family Medicine

## 2021-09-15 ENCOUNTER — Other Ambulatory Visit: Payer: Self-pay

## 2021-09-15 DIAGNOSIS — Z89432 Acquired absence of left foot: Secondary | ICD-10-CM | POA: Diagnosis not present

## 2021-09-15 DIAGNOSIS — E1149 Type 2 diabetes mellitus with other diabetic neurological complication: Secondary | ICD-10-CM

## 2021-09-15 DIAGNOSIS — B370 Candidal stomatitis: Secondary | ICD-10-CM | POA: Diagnosis not present

## 2021-09-15 DIAGNOSIS — D62 Acute posthemorrhagic anemia: Secondary | ICD-10-CM | POA: Diagnosis not present

## 2021-09-15 DIAGNOSIS — R2681 Unsteadiness on feet: Secondary | ICD-10-CM

## 2021-09-15 MED ORDER — NYSTATIN 100000 UNIT/ML MT SUSP: OROMUCOSAL | 0 refills | Status: DC

## 2021-09-15 NOTE — Progress Notes (Signed)
Virtual Visit via Video Note ? ?I connected with Tim [name] on 09/15/21 at  1:00 PM EDT by a video enabled telemedicine application and verified that I am speaking with the correct person using two identifiers. ? Location patient: [place] ?Location provider:work or home office ?Persons participating in the virtual visit: patient, provider ? ?I discussed the limitations and requested verbal permission for telemedicine visit. The patient expressed understanding and agreed to proceed. ? ? ?HPI: ?81 y/o male being seen today for left foot wound follow-up status post L foot transmetatarsal amputation for osteomyelitis. ?( I last saw him 05/11/21 and all was stable, including lab work).  ?  ?History diabetic peripheral neuropathy, is status excisional debridement and revision of transmetatarsal amputation of left foot done on 07/14/2021. ?Podiatry follow-up on 08/23/2021--encounter note reviewed today, showed intact sutures and a small area of superficial dehiscence of the surgical wound but no drainage or signs of infection. ?He was to continue offloading and IV antibiotics per infectious disease recommendations. ?He is happy to tell me that the wound is completely healed up now.  He did take the antibiotics 2 weeks after being sent home from the hospital but that was all that was needed. ? ? ?Home health arrangements--PT and OT.  This has been helping significantly and he needs to continue this. ?He is in a special shoe that provides offloading but this alters his gait in a way that precludes safe ambulation on inclines or stairs.  He is going to get the shoe discontinued pretty soon and is working with OT and PT, but in the meantime he is unable to leave the house without having to use nonemergent EMS transport.  Otherwise, he is able to ambulate around his house with walker or arm crutches and does not need a wheelchair. ? ?He states the IV antibiotics that he took for 2 weeks after going home--daptomycin and ertapenem--caused taste  changes such that many things taste as if they are rotten. ?He does have a little bit of white plaque on the inside of right cheek. ? ?He did have significant operative blood loss and this was superimposed on mild anemia prior to the surgery.  He did not go home on iron that he is aware of and he has not had any recheck of his blood tests. ?Metallic taste in his mouth since antibiotics, thinks he may have had some white plaques in his mouth recently. ? ?ROS as above, plus--> no fevers, no CP, no SOB, no wheezing, no cough, no dizziness, no HAs, no rashes, no melena/hematochezia.  No polyuria or polydipsia.  No myalgias or arthralgias.  No focal weakness, paresthesias, or tremors.  No acute vision or hearing abnormalities.  No dysuria or unusual/new urinary urgency or frequency.  No recent changes in lower legs. ?No n/v/d or abd pain.  No palpitations.   ? ? ?Allergies  ?Allergen Reactions  ? Brimonidine Tartrate Shortness Of Breath  ?  Alphagan-Shortness of breath  ? Brinzolamide Shortness Of Breath  ?  AZOPT- Shortness of breath  ? Latanoprost Shortness Of Breath  ?  XALATAN- Shortness of breath  ? Nucynta [Tapentadol] Shortness Of Breath  ? Sulfa Antibiotics Palpitations  ? Timolol Maleate Shortness Of Breath and Other (See Comments)  ?  TIMOPTIC- Aggravated asthma  ? Diltiazem Swelling  ?   leg swelling  ? Rofecoxib Swelling  ?   VIOXX- leg swelling  ? Vancomycin Hives and Other (See Comments)  ?  Possible "Red Man Syndrome"? > hives/blisters  ?  Codeine Other (See Comments)  ?  Childhood reaction  ? Tamsulosin Other (See Comments)  ?  Dizziness   ? Celecoxib Other (See Comments)  ?  CELLBREX-confusion  ? Colchicine Diarrhea  ?  diarrhea  ? Tape Rash  ? ?-COVID-19 vaccine status:  ?Immunization History  ?Administered Date(s) Administered  ? Fluad Quad(high Dose 65+) 03/27/2019, 04/30/2020, 05/11/2021  ? Influenza Whole 05/21/2008, 05/21/2009, 03/25/2010, 05/17/2012  ? Influenza, High Dose Seasonal PF 03/22/2015,  03/31/2016, 04/28/2017  ? Influenza, Seasonal, Injecte, Preservative Fre 03/19/2014, 03/03/2018  ? Influenza,inj,Quad PF,6+ Mos 03/19/2014  ? Influenza-Unspecified 03/03/2018  ? PFIZER(Purple Top)SARS-COV-2 Vaccination 09/03/2019, 10/01/2019, 03/19/2020  ? Pension scheme manager 66yr & up 03/09/2021  ? Pneumococcal Conjugate-13 09/10/2013  ? Pneumococcal Polysaccharide-23 06/06/2004, 01/03/2008  ? Td 08/06/1995, 09/08/2009  ? Tdap 09/03/2009, 09/19/2016  ? Zoster Recombinat (Shingrix) 04/21/2021  ? Zoster, Live 05/17/2012  ? ? ? ?ROS: See pertinent positives and negatives per HPI. ? ?Past Medical History:  ?Diagnosis Date  ? AICD (automatic cardioverter/defibrillator) present   ? Balanitis   ? +severe phimosis+ buried penis->circ not possible but dorsal slit done 10/2019  ? BPH (benign prostatic hypertrophy)   ? with urinary retention.  Renal u/s 12/04/19 NORMAL KIDNEYS, NORMAL BLADDER, NO HYDRONEPHROSIS  ? CAD (coronary artery disease)   ? Nonobstructive by 12/2012 cath; then 03/2016 he required BMS to RCA ([facility]).  In-stent restenosis on cath 11/01/16, baloon angioplasty successful.  ? CATARACT, HX OF   ? Chronic combined systolic and diastolic CHF (congestive heart failure) ([place] 05/2016  ? Ischemic CM.  04/2018 EF 40-45%, grd I DD.  ? Chronic pain syndrome   ? Lumbar DDD; chronic neuropathic pain (DM); has spinal stimulator and sees pain mgmt MD  ? Complicated UTI (urinary tract infection) 09/2019  ? Phimoses, acute urinary retention, entoerococcus UTI, enterococc bacteremia.  F/u blood clx's neg x 5d.    ? COPD   ? Diabetes mellitus type 2 with complications ([place]   ? HbA1c jumped from 5.7% to 6.1% 03/2015---started metformin at that time.  DM 2 dx by fasting gluc criteria 2018.  Has chronic neuropath pain  ? Enterococcal bacteremia 09/2019  ? Phimoses, acute urinary retention, entoerococcus UTI, enterococc bacteremia.  F/u blood clx's neg x 5d.    ? Essential hypertension, benign   ? Fatty liver  2007  ? 2007 u/s showed fatty liver with hepatosplenomegaly.  2019 repeat u/s->fatty liver but no cirrhosis or hepatosplenomegaly.  ? GERD (gastroesophageal reflux disease)   ? + hx of esoph stenosis, +dilation  ? Glaucoma   ? GOUT   ? HH (hiatus hernia)   ? HYPERCHOLESTEROLEMIA-PURE   ? Hypogonadism male   ? ICD (implantable cardioverter-defibrillator) infection ([place] 12/22/2019  ? 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 TAVR + RV pacer lead with vegetations->gram + cocci in chains, ?enterococcus (ID->Dr. VTommy Medal  ? Lumbosacral neuritis   ? Lumbosacral spondylosis   ? Lumbar spinal stenosis with neurogenic claudication--contributes to his chronic pain syndrome  ? Normal memory function 08/2014  ? Neuropsychological testing ([facility]): no cognitive impairment or sign of neurodegenerative disorder.  Likely has adjustment d/o with mixed anxiety/depressed features and may benefit from low dose SNRI.    ? Normocytic anemia 03/2016  ? Mild-pt needs ferritin and vit B12 level checked (as of 03/22/16). Hb stable 09/2019  ? NSTEMI (non-ST elevated myocardial infarction) ([place] 03/20/2016  ? BMS to RCA  ? Obesity hypoventilation syndrome ([place]   ? Orthostatic hypotension   ?  OSA on CPAP   ? 8 cm H2O  ? OSTEOARTHRITIS   ? PAF (paroxysmal atrial fibrillation) ([place])   ? Paroxysmal atrial fibrillation ([place]) 2003  ?  (? chronic?) Off anticoag for a while due to falls.  Then apixaban started 12/2014.  ? Peripheral neuropathy   ? 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 Ella Bodo): neuropathic pain->diff to treat, failed nucynta, failed spinal stimul trial, oxycontin hs + tramadol + gabap as of 12/2017 f/u Dr. Letta Pate.  ? Personal history of colonic adenoma 10/30/2012  ? Diminutive adenoma, consider repeat 2019 per GI  ? PFO (patent foramen ovale) 09/2019  ? small, with predominately L to R shunt  ? Physical deconditioning 02/23/2021  ? Presence of cardiac defibrillator 11/07/2017  ? Primary  osteoarthritis of both knees   ? Bone on bone of medial compartments, + signif patellofemoral arth bilat.--supartz inj series started 09/12/17  ? 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 PUD (peptic ulc

## 2021-09-17 ENCOUNTER — Other Ambulatory Visit: Payer: Self-pay | Admitting: Family Medicine

## 2021-09-18 IMAGING — CT CT RENAL STONE PROTOCOL
2 of 4 series · 17 of 46 positions shown, 19 images · non-contrast
Comparison: CT 09/09/2019

CLINICAL DATA: Right flank pain

EXAM:
CT ABDOMEN AND PELVIS WITHOUT CONTRAST
TECHNIQUE: Multidetector CT imaging of the abdomen and pelvis was performed
following the standard protocol without IV contrast.

[Series 3: ap without · axial · non-contrast · 0.92mm/px · z∈[-377,+23]mm · 14 of 92 slices shown, 16 images]
[im 6/92  soft-tissue]
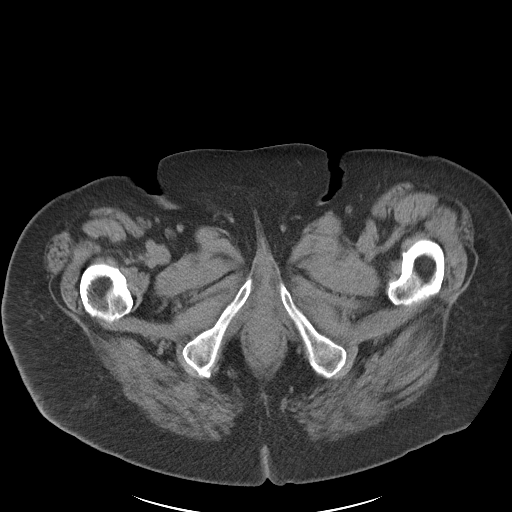
[im 6/92  bone]
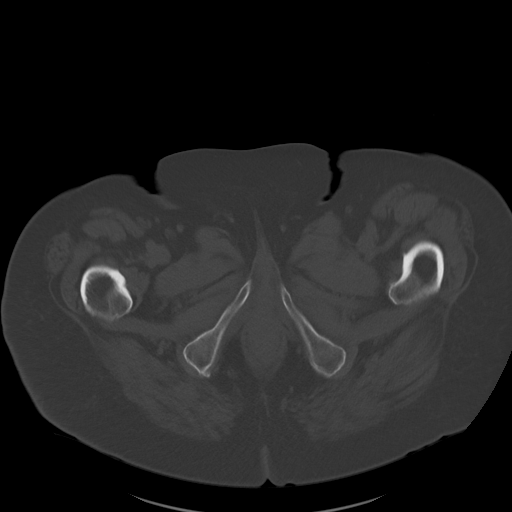
[im 11/92  soft-tissue]
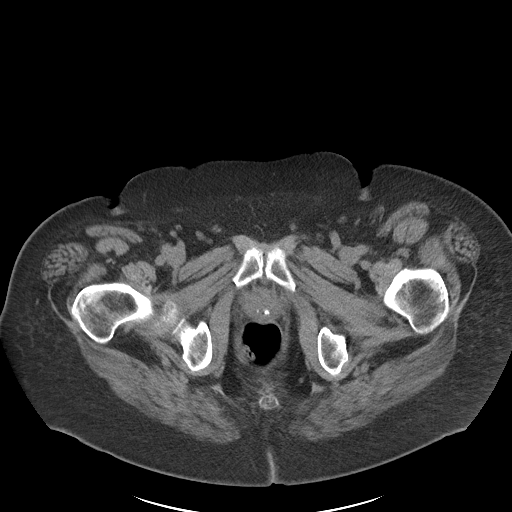
[im 21/92  soft-tissue]
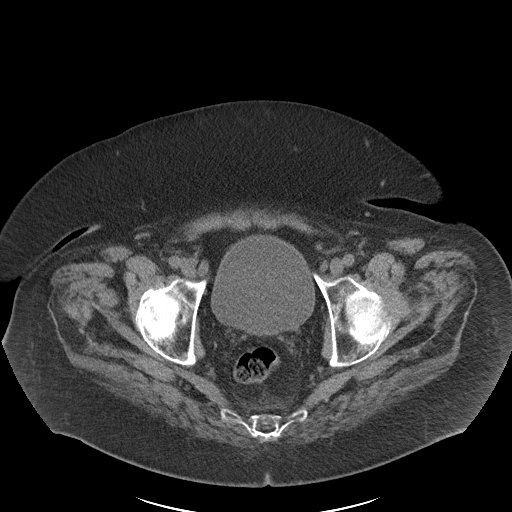
[im 26/92  soft-tissue]
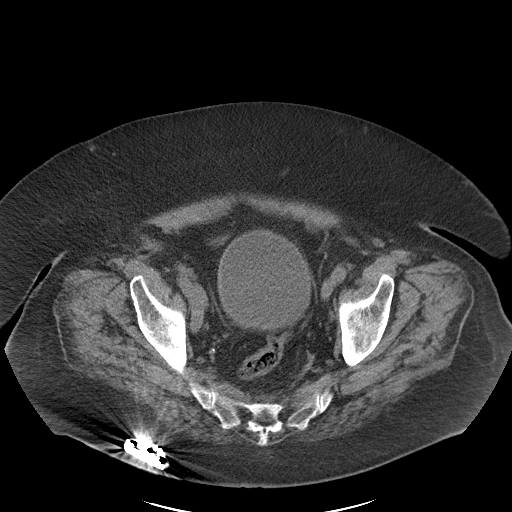
[im 31/92  soft-tissue]
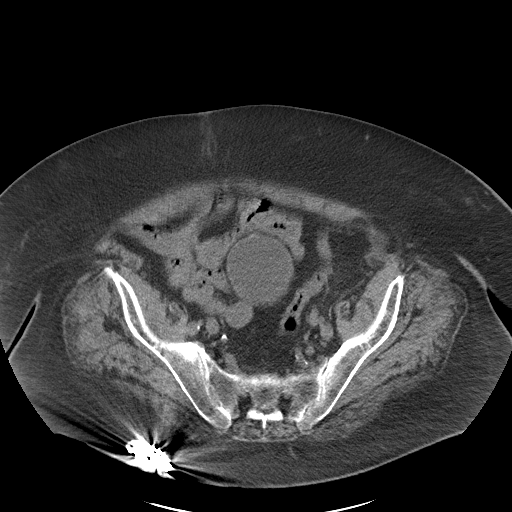
[im 36/92  soft-tissue]
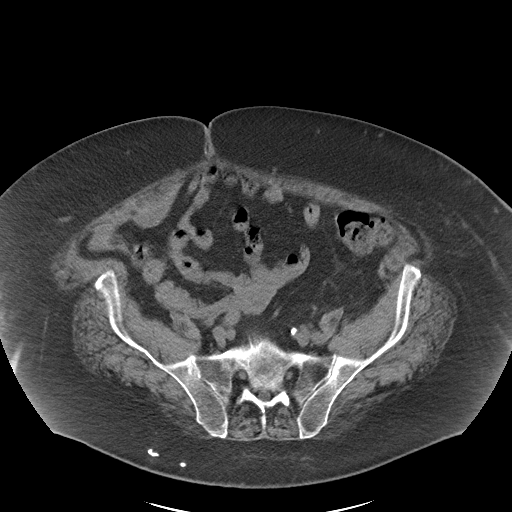
[im 41/92  soft-tissue]
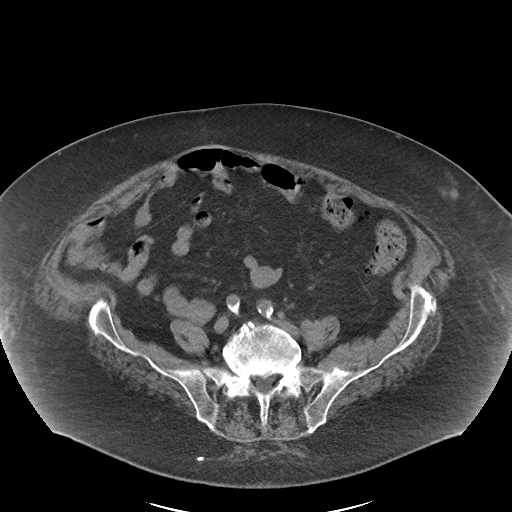
[im 51/92  soft-tissue]
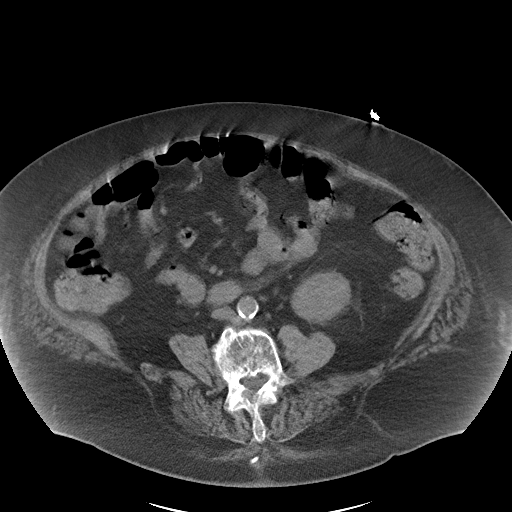
[im 56/92  soft-tissue]
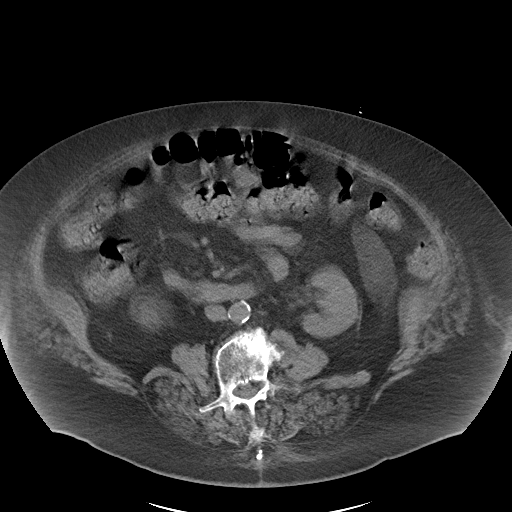
[im 56/92  bone]
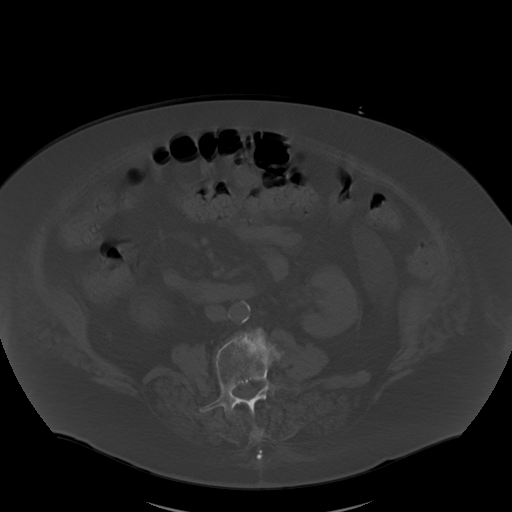
[im 61/92  soft-tissue]
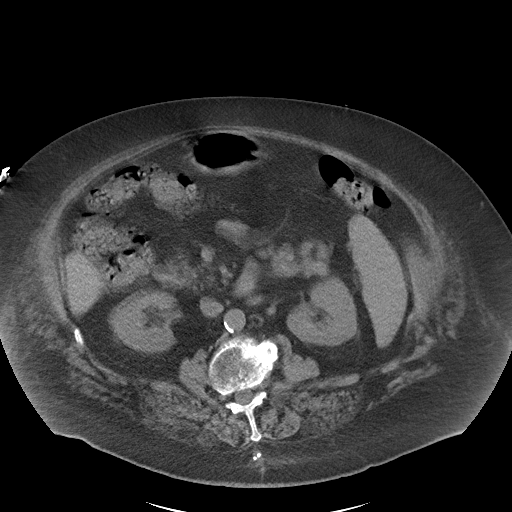
[im 66/92  soft-tissue]
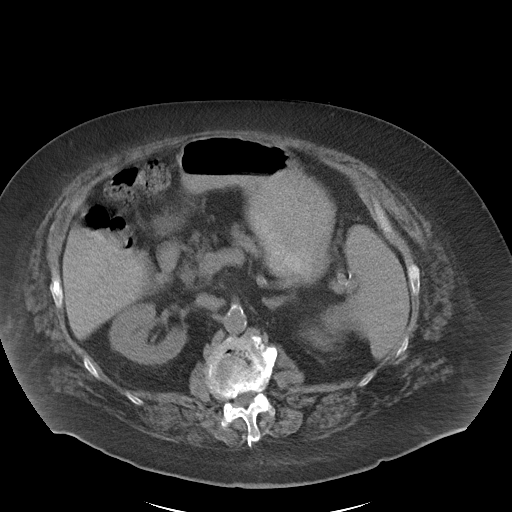
[im 71/92  soft-tissue]
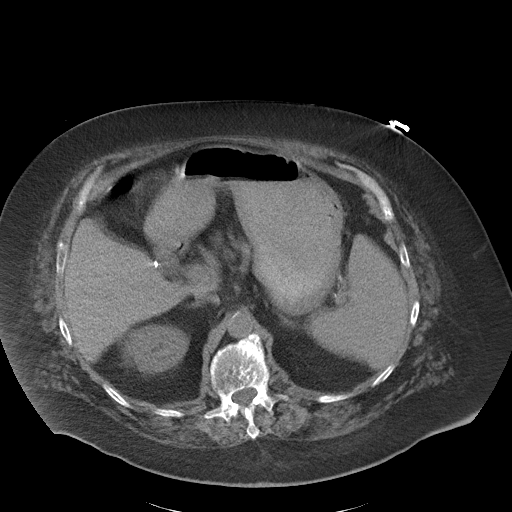
[im 81/92  soft-tissue]
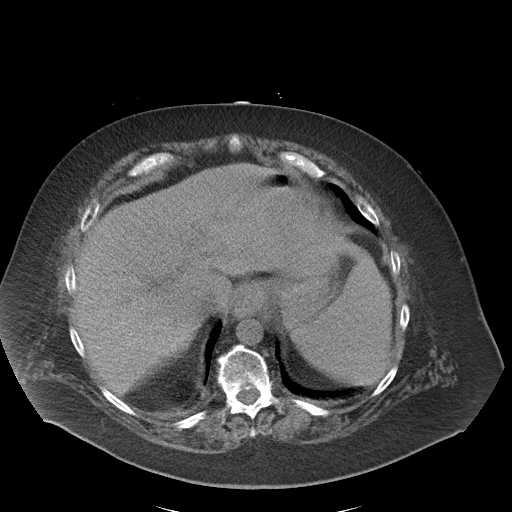
[im 86/92  soft-tissue]
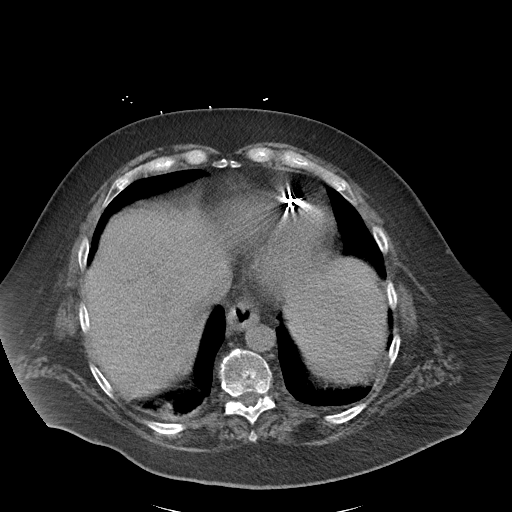

[Series 6: cor · coronal · 0.92mm/px · 3 of 123 slices shown]
[im 41/123  soft-tissue]
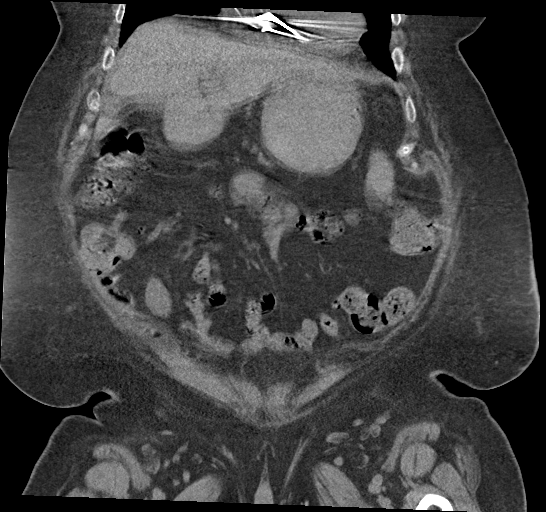
[im 55/123  soft-tissue]
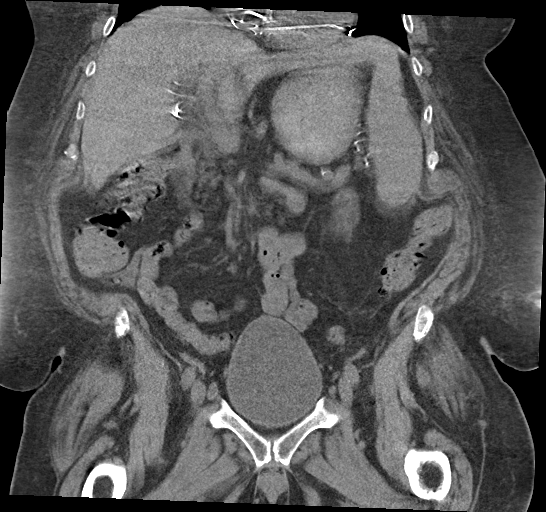
[im 68/123  soft-tissue]
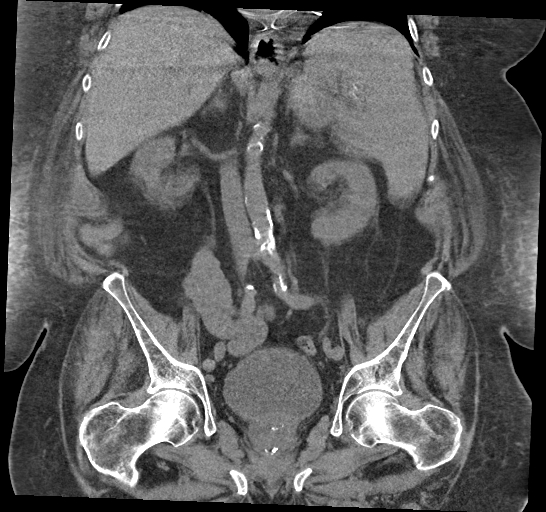

[17 of 46 positions shown; findings below may reference images not displayed]

FINDINGS: Technical note: Motion degraded exam.

Lower chest: No acute abnormality.

Hepatobiliary: No focal liver abnormality is seen. Status post
cholecystectomy. No biliary dilatation.

Pancreas: Unremarkable. No pancreatic ductal dilatation or
surrounding inflammatory changes.

Spleen: Normal in size without focal abnormality.

Adrenals/Urinary Tract: Unremarkable adrenal glands. Kidneys have an
unremarkable noncontrast appearance. No renal stone or
hydronephrosis. The urinary bladder appears unremarkable.

Stomach/Bowel: Stomach is within normal limits. Mild-moderate
colonic stool volume. Scattered diverticulosis. No evidence of bowel
wall thickening, distention, or inflammatory changes.

Vascular/Lymphatic: Aortoiliac atherosclerotic calcification without
aneurysm. No abdominopelvic lymphadenopathy.

Reproductive: Prostate is unremarkable.

Other: No free fluid. No organized fluid collection. No free air.
Thoracic spinal stimulator is noted.

Musculoskeletal: Similar degenerative changes of the lumbar spine.
No new or acute osseous findings.
IMPRESSION: 1. Motion degraded exam.
2. No acute abdominopelvic findings. Specifically, no renal stone or
evidence of obstructive uropathy.
3. Colonic diverticulosis without evidence of acute diverticulitis.
4. Aortic atherosclerosis. ([id number]).

## 2021-10-03 ENCOUNTER — Encounter: Payer: Self-pay | Admitting: Orthopedic Surgery

## 2021-10-03 ENCOUNTER — Ambulatory Visit (INDEPENDENT_AMBULATORY_CARE_PROVIDER_SITE_OTHER): Payer: Medicare Other

## 2021-10-03 ENCOUNTER — Ambulatory Visit (INDEPENDENT_AMBULATORY_CARE_PROVIDER_SITE_OTHER): Payer: Medicare Other | Admitting: Orthopedic Surgery

## 2021-10-03 DIAGNOSIS — Z89432 Acquired absence of left foot: Secondary | ICD-10-CM | POA: Diagnosis not present

## 2021-10-03 DIAGNOSIS — L97521 Non-pressure chronic ulcer of other part of left foot limited to breakdown of skin: Secondary | ICD-10-CM

## 2021-10-03 NOTE — Progress Notes (Signed)
? ?Office Visit Note ?  ?Patient: Tim [name]           ?Date of Birth: June 11, 1941           ?MRN: [id number] ?Visit Date: 10/03/2021 ?             ?Requested by: Tammi Sou, MD ?[phone] ?[place],  [place] [address] ?PCP: Tammi Sou, MD ? ?Chief Complaint  ?Patient presents with  ? Left Foot - Wound Check  ?  Hx TMA  ? ? ? ? ?HPI: ?Patient is an 81 year old gentleman is seen in follow-up for the left transmetatarsal amputation.  Patient states he was hospitalized at [facility] in [place] he was thinking that there was a cardiac event and during his examination it was determined that he had an infection of the transmetatarsal amputation.  He states he underwent 2 debridements in January with revision of the transmetatarsal amputation.  Patient was discharged with a PICC line and states he was on daptomycin. ? ?Assessment & Plan: ?Visit Diagnoses:  ?1. Non-pressure chronic ulcer of other part of left foot limited to breakdown of skin ([place])   ?2. History of transmetatarsal amputation of left foot ([place])   ? ? ?Plan: Patient's wound is well-healed there is no cellulitis there is no induration.  Patient may resume regular shoewear without restrictions. ? ?Follow-Up Instructions: Return in about 4 weeks (around 10/31/2021).  ? ?Ortho Exam ? ?[name], oriented, no adenopathy, well-dressed, normal affect, normal respiratory effort. ?Examination the surgical incision is well-healed.  Radiographically it appears that a few millimeters of the metatarsals were resected with more aggressive resection of the third metatarsal.  There is no induration swelling over the foot.  There is no tenderness to palpation.  His foot is plantigrade. ? ?Imaging: ?XR Foot 2 Views Left ? ?Result Date: 10/03/2021 ?2 view radiographs of the left foot shows a few millimeters of resection of the metatarsals with more aggressive resection of the third metatarsal.  The edges of the bone are smooth without  destructive changes.  ?No images are attached to the encounter. ? ?Labs: ?Lab Results  ?Component Value Date  ? HGBA1C 6.2 05/11/2021  ? HGBA1C 6.0 02/09/2021  ? HGBA1C 5.5 11/01/2020  ? HGBA1C 5.5 11/01/2020  ? HGBA1C 5.5 (A) 11/01/2020  ? HGBA1C 5.5 11/01/2020  ? ESRSEDRATE 34 (H) 11/24/2020  ? ESRSEDRATE 38 (H) 07/14/2020  ? ESRSEDRATE 20 02/09/2016  ? CRP 1.3 11/24/2020  ? CRP 0.9 07/14/2020  ? CRP 0.7 05/03/2020  ? LABURIC 3.5 (L) 09/10/2013  ? REPTSTATUS 05/04/2020 FINAL 04/28/2020  ? CULT  04/28/2020  ?  NO GROWTH 5 DAYS ?Performed at [facility], [place] [address], [place], [place] [address] ?  ? LABORGA ENTEROCOCCUS FAECALIS 09/11/2019  ? ? ? ?Lab Results  ?Component Value Date  ? ALBUMIN 3.6 11/01/2020  ? ALBUMIN 3.0 (L) 05/07/2020  ? ALBUMIN 2.9 (L) 05/06/2020  ? ? ?Lab Results  ?Component Value Date  ? MG 1.7 05/07/2020  ? MG 1.6 (L) 05/06/2020  ? MG 1.8 05/05/2020  ? ?No results found for: VD25OH ? ?No results found for: PREALBUMIN ? ?  Latest Ref Rng & Units 07/23/2021  ? 12:00 AM 05/11/2021  ?  2:09 PM 07/14/2020  ? 12:00 AM  ?CBC EXTENDED  ?WBC  7.1      6.1   7.2    ?RBC 4.22 - 5.81 Mil/uL  4.10   4.11    ?  Hemoglobin 13.5 - 17.5 9.1      12.2   12.2    ?HCT 41 - 53 27      37.0   36.7    ?Platelets 150 - 399 112      122.0   121    ?NEUT# 1,500 - 7,800 cells/uL   4,694    ?Lymph# 850 - 3,900 cells/uL   1,390    ?  ? This result is from an external source.  ? ? ? ?There is no height or weight on file to calculate BMI. ? ?Orders:  ?Orders Placed This Encounter  ?Procedures  ? XR Foot 2 Views Left  ? ?No orders of the defined types were placed in this encounter. ? ? ? Procedures: ?No procedures performed ? ?Clinical Data: ?No additional findings. ? ?ROS: ? ?All other systems negative, except as noted in the HPI. ?Review of Systems ? ?Objective: ?Vital Signs: There were no vitals taken for this visit. ? ?Specialty Comments:  ?No specialty comments available. ? ?PMFS History: ?Patient Active Problem  List  ? Diagnosis Date Noted  ? Weakness 02/23/2021  ? Physical deconditioning 02/23/2021  ? Rhinitis 07/14/2020  ? Mitral valve vegetation 05/28/2020  ? Non-pressure chronic ulcer of other part of left foot limited to breakdown of skin ([place])   ? Esophageal dysphagia   ? Bacteremia   ? Endocarditis of mitral valve   ? Fever 04/28/2020  ? Cervical lymphadenopathy 03/15/2020  ? 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 ICD (implantable cardioverter-defibrillator) infection ([place]) 12/22/2019  ? Gram-positive bacteremia 11/27/2019  ? FUO (fever of unknown origin) 11/26/2019  ? Low blood pressure reading 11/05/2019  ? Wound dehiscence 10/22/2019  ? PFO (patent foramen ovale) 10/06/2019  ? ICD (implantable cardioverter-defibrillator) battery depletion   ? Pressure injury of skin 09/13/2019  ? Enterococcal bacteremia history  09/13/2019  ? 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  ? 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 Urinary retention 09/13/2019  ? Phimosis 09/13/2019  ? SIRS (systemic inflammatory response syndrome) ([place]) 09/12/2019  ? Sepsis ([place]) 09/12/2019  ? Enterococcus UTI 09/12/2019  ? Cellulitis of foot   ? History of transmetatarsal amputation of left foot ([place])   ? Subacute osteomyelitis, left ankle and foot ([place])   ? 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 Diabetic neuropathy, painful ([place]) 12/26/2016  ? Neuropathic pain of foot, left 06/20/2016  ? Insulin resistance 07/22/2015  ? Status post amputation of left great toe ([place]) 06/08/2015  ? Spinal stenosis of lumbar region 02/16/2015  ? Hereditary and idiopathic peripheral neuropathy 01/12/2015  ? Oral thrush 12/28/2014  ? Orthostatic dizziness 05/24/2014  ? Paresthesias with subjective weakness 01/30/2014  ? Bilateral thigh pain 01/30/2014  ? Abdominal wall pain in right upper quadrant 01/22/2014  ? Gross hematuria 09/25/2013  ?  Intertrigo 09/25/2013  ? IBS (irritable bowel syndrome) 09/11/2013  ? Chronic pain syndrome 08/06/2013  ? CAD (coronary artery disease), native coronary artery 12/25/2012  ? Paroxysmal atrial fibrillation ([place]) 12/25/2012  ? Moderate aortic stenosis 12/25/2012  ? Aortic stenosis 12/24/2012  ? Personal history of colonic adenoma 10/30/2012  ? Diverticulosis of colon (without mention of hemorrhage) 10/30/2012  ? Internal hemorrhoids 10/30/2012  ? Change in bowel habits intermittent loose stools 10/30/2012  ? Routine health maintenance 06/09/2012  ? Hereditary peripheral neuropathy 10/10/2011  ? Long term current use of anticoagulant - apixiban 08/16/2010  ? Dyspnea on exertion  09/08/2009  ? HYPERCHOLESTEROLEMIA-PURE 01/13/2009  ? Essential hypertension, benign 01/13/2009  ? EDEMA 01/13/2009  ? GOUT 01/12/2009  ? GLAUCOMA 01/12/2009  ? Venous insufficiency (chronic) (peripheral) 01/12/2009  ? COPD (chronic obstructive pulmonary disease) ([place]) 01/12/2009  ? VENTRAL HERNIA 01/12/2009  ? OSTEOARTHRITIS 01/12/2009  ? ARTHRITIS 01/12/2009  ? [place] DISEASE 01/12/2009  ? CATARACT, HX OF 01/12/2009  ? HEART MURMUR, HX OF 01/12/2009  ? BENIGN PROSTATIC HYPERTROPHY, HX OF 01/12/2009  ? Obesity 01/17/2007  ? OBSTRUCTIVE SLEEP APNEA 01/17/2007  ? ASTHMA 01/17/2007  ? GERD 01/17/2007  ? HALLUX VALGUS, ACQUIRED 01/17/2007  ? Pulmonary hypertension ([place]) 01/17/2007  ? ?Past Medical History:  ?Diagnosis Date  ? AICD (automatic cardioverter/defibrillator) present   ? Balanitis   ? +severe phimosis+ buried penis->circ not possible but dorsal slit done 10/2019  ? BPH (benign prostatic hypertrophy)   ? with urinary retention.  Renal u/s 12/04/19 NORMAL KIDNEYS, NORMAL BLADDER, NO HYDRONEPHROSIS  ? CAD (coronary artery disease)   ? Nonobstructive by 12/2012 cath; then 03/2016 he required BMS to RCA ([facility]).  In-stent restenosis on cath 11/01/16, baloon angioplasty successful.  ? CATARACT, HX OF   ? Chronic combined systolic and  diastolic CHF (congestive heart failure) ([place]) 05/2016  ? Ischemic CM.  04/2018 EF 40-45%, grd I DD.  ? Chronic pain syndrome   ? Lumbar DDD; chronic neuropathic pain (DM); has spinal stimulator and sees pain mgmt M

## 2021-10-04 ENCOUNTER — Telehealth: Payer: Self-pay | Admitting: Family Medicine

## 2021-10-04 NOTE — Telephone Encounter (Signed)
FYI  Please see below

## 2021-10-04 NOTE — Telephone Encounter (Signed)
Tim [name] (home health) [phone] ? ?Pt isn't picking up for phone calls &  the OT was trying to see pt. ?Pt isn't opening door for physical therapist. ? ?She can't file his insurance until she sees him. ?Tomorrow is the last day to see pt, if they don't see pt tomorrow. They will discharge him ? ? ? ? ?

## 2021-10-05 NOTE — Telephone Encounter (Signed)
LM for pt to return call to discuss.  

## 2021-10-05 NOTE — Telephone Encounter (Signed)
Will you give the patient a call to see if he picks up? ?

## 2021-10-05 NOTE — Telephone Encounter (Signed)
Pt has asked home health to discharge him. PCP has been made aware and pt requests to schedule appt with PCP. ?

## 2021-10-13 ENCOUNTER — Ambulatory Visit: Payer: Medicare Other | Admitting: Family Medicine

## 2021-10-13 ENCOUNTER — Other Ambulatory Visit: Payer: Self-pay | Admitting: Family Medicine

## 2021-10-13 ENCOUNTER — Encounter: Payer: Self-pay | Admitting: Family Medicine

## 2021-10-13 VITALS — BP 107/56 | HR 77 | Temp 98.0°F | Ht 72.0 in

## 2021-10-13 DIAGNOSIS — Z89432 Acquired absence of left foot: Secondary | ICD-10-CM

## 2021-10-13 DIAGNOSIS — K219 Gastro-esophageal reflux disease without esophagitis: Secondary | ICD-10-CM

## 2021-10-13 DIAGNOSIS — N138 Other obstructive and reflux uropathy: Secondary | ICD-10-CM

## 2021-10-13 DIAGNOSIS — N401 Enlarged prostate with lower urinary tract symptoms: Secondary | ICD-10-CM

## 2021-10-13 DIAGNOSIS — E1142 Type 2 diabetes mellitus with diabetic polyneuropathy: Secondary | ICD-10-CM

## 2021-10-13 DIAGNOSIS — E1159 Type 2 diabetes mellitus with other circulatory complications: Secondary | ICD-10-CM

## 2021-10-13 DIAGNOSIS — D62 Acute posthemorrhagic anemia: Secondary | ICD-10-CM | POA: Diagnosis not present

## 2021-10-13 DIAGNOSIS — I152 Hypertension secondary to endocrine disorders: Secondary | ICD-10-CM

## 2021-10-13 DIAGNOSIS — Z7901 Long term (current) use of anticoagulants: Secondary | ICD-10-CM

## 2021-10-13 DIAGNOSIS — I951 Orthostatic hypotension: Secondary | ICD-10-CM

## 2021-10-13 MED ORDER — TORSEMIDE 20 MG PO TABS: ORAL_TABLET | ORAL | 0 refills | Status: DC

## 2021-10-13 NOTE — Progress Notes (Signed)
OFFICE VISIT ? ?10/13/2021 ? ?CC: f/u DM, multiple chronic illnesses/debilitated pt ?HPI:   ? ?Patient is a 81 y.o. male who presents for f/u DM. 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 ?Wound is healed and he continues to work with PT and OT for work on offloading and appropriate gait training for balance, particularly on uneven surfaces/steps.  Utilizing a walker or arm crutches for the most part, rarely requires wheelchair.  Does not have a wheelchair lift for his home nor does he have a wheelchair ramp.  At this time, in order leave his home it requires nonemergency EMS transport. ?  ?2. Intraoperative blood loss.  At next follow-up here in the office we will check CBC. ?  ?3. Possible thrush.  Prescribed nystatin suspension. ?  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  Currently on no diabetic medications. ?Last hemoglobin A1c good at 6.2% about 4 months ago. ?Repeat A1c at next office visit" ? ? ?INTERIM HX: ?Tim [name] is feeling well. ?He is finished with PT now and is able to navigate his home in general walking on flat surfaces and up and down a few steps.  He uses arm crutches/canes. ?He rarely has dizziness with standing up.  He has stable dyspnea on exertion. ?Home blood pressures around 100-1 05 over 50s.  This is his baseline . ?he denies any pain. ? ?He does not check glucoses.  He is on no diabetic medications at this time. ? ?He has a chronic normocytic anemia that had some superimposed blood loss anemia after foot surgery 3 months ago.  He was told to be on iron in rehab but he says he did not think this was happening. ? ?Recent follow-up with electrophysiology provider was all stable. ?Most recent ejection fraction 55 to 60% January 2023. ?He takes Toprol and Entresto.  He has torsemide to use as needed.  Has BiV ICD. ?He is on statin and aspirin for coronary artery disease. ?He is on Eliquis for paroxysmal  atrial fibrillation. ?Has pacer and prosthetic aortic valve--> history of infection, on chronic suppressive amoxicillin therapy. ? ?ROS as above, plus--> no fevers, no CP, no SOB at rest, no wheezing, no cough, no vertigo, no HAs, no rashes, no melena/hematochezia.  No polyuria or polydipsia.  No myalgias or arthralgias.  No focal weakness, paresthesias, or tremors.  No acute vision or hearing abnormalities.  No dysuria or unusual/new urinary urgency or frequency.  No recent changes in lower legs. ?No n/v/d or abd pain.  No palpitations.   ? ? ? ?Past Medical History:  ?Diagnosis Date  ? AICD (automatic cardioverter/defibrillator) present   ? Balanitis   ? +severe phimosis+ buried penis->circ not possible but dorsal slit done 10/2019  ? BPH (benign prostatic hypertrophy)   ? with urinary retention.  Renal u/s 12/04/19 NORMAL KIDNEYS, NORMAL BLADDER, NO HYDRONEPHROSIS  ? CAD (coronary artery disease)   ? Nonobstructive by 12/2012 cath; then 03/2016 he required BMS to RCA ([facility]).  In-stent restenosis on cath 11/01/16, baloon angioplasty successful.  ? CATARACT, HX OF   ? Chronic combined systolic and diastolic CHF (congestive heart failure) ([place]) 05/2016  ? Ischemic CM.  04/2018 EF 40-45%, grd I DD.  ? Chronic pain syndrome   ? Lumbar DDD; chronic neuropathic pain (DM); has spinal stimulator and sees pain mgmt MD  ? Complicated UTI (urinary tract infection) 09/2019  ? Phimoses, acute urinary retention, entoerococcus UTI, enterococc bacteremia.  F/u blood clx's neg x 5d.    ? COPD   ? Debilitated patient   ? Diabetes mellitus type 2 with complications ([place])   ? HbA1c jumped from 5.7% to 6.1% 03/2015---started metformin at that time.  DM 2 dx by fasting gluc criteria 2018.  Has chronic neuropath pain  ? Enterococcal bacteremia 09/2019  ? Phimoses, acute urinary retention, entoerococcus UTI, enterococc bacteremia.  F/u blood clx's neg x 5d.    ? Essential hypertension, benign   ? Fatty liver 2007  ? 2007 u/s showed fatty  liver with hepatosplenomegaly.  2019 repeat u/s->fatty liver but no cirrhosis or hepatosplenomegaly.  ? Generalized weakness   ? GERD (gastroesophageal reflux disease)   ? + hx of esoph stenosis, +dilation  ? Glaucoma   ? GOUT   ? HH (hiatus hernia)   ? HYPERCHOLESTEROLEMIA-PURE   ? Hypogonadism male   ? ICD (implantable cardioverter-defibrillator) infection ([place]) 12/22/2019  ? 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 TAVR + RV pacer lead with vegetations->gram + cocci in chains, ?enterococcus (ID->Dr. Tommy Medal)  ? Lumbosacral neuritis   ? Lumbosacral spondylosis   ? Lumbar spinal stenosis with neurogenic claudication--contributes to his chronic pain syndrome  ? Morbid obesity ([place])   ? Normal memory function 08/2014  ? Neuropsychological testing ([facility]): no cognitive impairment or sign of neurodegenerative disorder.  Likely has adjustment d/o with mixed anxiety/depressed features and may benefit from low dose SNRI.    ? Normocytic anemia 03/2016  ? Mild-pt needs ferritin and vit B12 level checked (as of 03/22/16). Hb stable 09/2019  ? NSTEMI (non-ST elevated myocardial infarction) ([place]) 03/20/2016  ? BMS to RCA  ? Obesity hypoventilation syndrome ([place])   ? Orthostatic hypotension   ? OSA on CPAP   ? 8 cm H2O  ? OSTEOARTHRITIS   ? Paroxysmal atrial fibrillation ([place]) 2003  ?  (? chronic?) Off anticoag for a while due to falls.  Then apixaban started 12/2014.  ? Peripheral neuropathy   ? 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 Ella Bodo): neuropathic pain->diff to treat, failed nucynta, failed spinal stimul trial, oxycontin hs + tramadol + gabap as of 12/2017 f/u Dr. Letta Pate.  ? Personal history of colonic adenoma 10/30/2012  ? Diminutive adenoma, consider repeat 2019 per GI  ? PFO (patent foramen ovale) 09/2019  ? small, with predominately L to R shunt  ? Physical deconditioning 02/23/2021  ? Presence of cardiac defibrillator 11/07/2017  ? Primary osteoarthritis of both knees    ? Bone on bone of medial compartments, + signif patellofemoral arth bilat.--supartz inj series started 09/12/17  ? 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 PUD (peptic ulcer disease)   ? PULMONARY HYPERTENSION, HX OF   ? Secondary male hypogonadism 2017  ? Sepsis ([place]) 04/29/2020  ? Severe aortic stenosis   ? TAVR 04/11/16 ([facility])  ? Shortness of breath   ? with exertion: much improved s/p TAVR and treatment for CHF.  ? Sick sinus syndrome ([place])   ? PPM placed  ? Thrombocytopenia ([place]) 2018  ? HSM on 2007 abd u/s---suspect some mild splenic sequestration chronically.  ? Unspecified glaucoma(365.9)   ? Unspecified hereditary and idiopathic peripheral neuropathy approx age 24  ? bilat LE's, ? left arm, too.  Feet became progressively numb + left foot pain intermittently.  Pt may be trying a spinal stimulator (as of 05/2015)  ? VENOUS INSUFFICIENCY   ? Being followed by Dr. Sharol Given as of 10/2016 for two R LL venous  stasis ulcers/skin tears.  Healed as of 10/30/16 f/u with Dr. Sharol Given.  ? VENTRAL HERNIA   ? ? ?Past Surgical History:  ?Procedure Laterality Date  ? AMPUTATION Left 04/11/2013  ? Procedure: AMPUTATION DIGIT Left 3rd toe;  Surgeon: [name], MD;  Location: [facility];  Service: Orthopedics;  Laterality: Left;  Left 3rd toe amputation at [facility]  ? AMPUTATION Left 08/29/2019  ? Procedure: LEFT TRANSMETATARSAL AMPUTATION;  Surgeon: [name], MD;  Location: [facility];  Service: Orthopedics;  Laterality: Left;  ? BIOPSY  05/04/2020  ? Procedure: BIOPSY;  Surgeon: Jackquline Denmark, MD;  Location: [facility] ENDOSCOPY;  Service: Endoscopy;;  ? [place]; 03/10/16  ? 1997 Non-obstructive disease.  03/2016 BMS to RCA, with 25% pDiag dz, o/w normal cors per cath 03/07/16.  Cath 11/01/16: in stent restenosis, successful baloon angioplasty. 08/2020 branch vessel dz, cont medical therapy rec'd.  ? CARDIAC CATHETERIZATION  12/24/2012  ? mild < 20% LCx, prox 30% RCA; LVEF 55-65% , moderate pulmonary HTN, moderate AS  ?  CARDIAC DEFIBRILLATOR PLACEMENT  11/07/2017  ? [place] MRI Quad CRT defibrillator  ? CARDIOVASCULAR STRESS TEST  05/11/16 ([facility])  ? 2017 Myocardial perfusion imaging:  No ischemia; scar in apex, global hypokin

## 2021-10-14 LAB — COMPREHENSIVE METABOLIC PANEL
ALT: 40 U/L (ref 0–53)
AST: 43 U/L — ABNORMAL HIGH (ref 0–37)
Albumin: 3.4 g/dL — ABNORMAL LOW (ref 3.5–5.2)
Alkaline Phosphatase: 73 U/L (ref 39–117)
BUN: 19 mg/dL (ref 6–23)
CO2: 28 mEq/L (ref 19–32)
Calcium: 8.5 mg/dL (ref 8.4–10.5)
Chloride: 103 mEq/L (ref 96–112)
Creatinine, Ser: 0.74 mg/dL (ref 0.40–1.50)
GFR: 84.75 mL/min (ref >60.00)
Glucose, Bld: 143 mg/dL — ABNORMAL HIGH (ref 70–99)
Potassium: 4.2 mEq/L (ref 3.5–5.1)
Sodium: 137 mEq/L (ref 135–145)
Total Bilirubin: 0.5 mg/dL (ref 0.2–1.2)
Total Protein: 5.8 g/dL — ABNORMAL LOW (ref 6.0–8.3)

## 2021-10-14 LAB — CBC
HCT: 33.1 % — ABNORMAL LOW (ref 39.0–52.0)
Hemoglobin: 11.2 g/dL — ABNORMAL LOW (ref 13.0–17.0)
MCHC: 33.8 g/dL (ref 30.0–36.0)
MCV: 90.4 fl (ref 78.0–100.0)
Platelets: 133 10*3/uL — ABNORMAL LOW (ref 150.0–400.0)
RBC: 3.66 Mil/uL — ABNORMAL LOW (ref 4.22–5.81)
RDW: 16.1 % — ABNORMAL HIGH (ref 11.5–15.5)
WBC: 6.1 10*3/uL (ref 4.0–10.5)

## 2021-10-14 LAB — HEMOGLOBIN A1C: Hgb A1c MFr Bld: 6.1 % (ref 4.6–6.5)

## 2021-10-14 LAB — IRON,TIBC AND FERRITIN PANEL
%SAT: 21 % (calc) (ref 20–48)
Ferritin: 17 ng/mL — ABNORMAL LOW (ref 24–380)
Iron: 60 ug/dL (ref 50–180)
TIBC: 292 mcg/dL (calc) (ref 250–425)

## 2021-10-19 ENCOUNTER — Ambulatory Visit (INDEPENDENT_AMBULATORY_CARE_PROVIDER_SITE_OTHER): Payer: Medicare Other | Admitting: Infectious Disease

## 2021-10-19 ENCOUNTER — Other Ambulatory Visit: Payer: Self-pay

## 2021-10-19 ENCOUNTER — Encounter: Payer: Self-pay | Admitting: Infectious Disease

## 2021-10-19 VITALS — BP 113/78 | HR 84 | Temp 98.5°F | Resp 16

## 2021-10-19 DIAGNOSIS — M86272 Subacute osteomyelitis, left ankle and foot: Secondary | ICD-10-CM | POA: Diagnosis not present

## 2021-10-19 DIAGNOSIS — B952 Enterococcus as the cause of diseases classified elsewhere: Secondary | ICD-10-CM

## 2021-10-19 DIAGNOSIS — T826XXD Infection and inflammatory reaction due to cardiac valve prosthesis, subsequent encounter: Secondary | ICD-10-CM

## 2021-10-19 DIAGNOSIS — A498 Other bacterial infections of unspecified site: Secondary | ICD-10-CM

## 2021-10-19 DIAGNOSIS — I330 Acute and subacute infective endocarditis: Secondary | ICD-10-CM

## 2021-10-19 DIAGNOSIS — I38 Endocarditis, valve unspecified: Secondary | ICD-10-CM

## 2021-10-19 DIAGNOSIS — T827XXD Infection and inflammatory reaction due to other cardiac and vascular devices, implants and grafts, subsequent encounter: Secondary | ICD-10-CM | POA: Diagnosis not present

## 2021-10-19 NOTE — Progress Notes (Signed)
? ?Subjective:  ?Chief complaint: follow-up for PVE pacemaker infection and osteomyelitis of foot ? ? ? Patient ID: JOHNNEY [name], male    DOB: Feb 09, 1941, 81 y.o.   MRN: [id number] ? ?HPI ? ?81 y.o. male who  initially presented with AMP sensitive enterococcal UTI complicated by enterococcal bacteremia in March 2021 in the setting of AICD and TAVR. TTE and TEE at the time were without evidence of vegetation and was prescribed 2 weeks of ampicillin. On follow up on 4/21 there was worsening of his amputation site with erythema and drainage. He was placed on doxycycline by Dr. Sharol Given which was then changed to Zyvox by me. This resolved his symptoms. On follow up with myself  on 11/26/2019 he was experiencing fevers and dizziness with concern for systemic infection. Blood cultures in the ID clinic were positive for Enterococcus faecalis bacteremia. Surgical site was well healed at that time. 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 Recommended treatment was 6 weeks of ampicillin and ceftriaxone through 6/21. Feeling better on 6/21 and transitioned to life-long suppression with amoxicillin three times daily.  ?  ?[name] was then seen on 7/15 /2021 seen by Dr. Johnnye Sima with concern for cellulitis and was continued on his Amoxicillin. He followed up with Dr. Johnnye Sima on 8/6 and continued to do well with Amoxicillin.  ? ?Blood cultures no growth but MV had vegetation. TEE was not successful.  He was retreated with ampicillin high-dose and ceftriaxone twice daily. ? ?He completed 6 weeks of therapy and  was then on  high-dose amoxicillin to 1000 g twice daily. ? ?Followed very closely by Dr. Sharol Given for his chronic surgical wound site where he had osteomyelitis in that area is been doing well according to him and also going to Dr. Jess Barters notes. ? ?Since I last saw him he was admitted to [facility] with infection of his foot having presented with redness and drainage from the left TMA site. Blood cultures  from admission were negative. CT of the left foot had cellulitis of the distal forefoot with abscess and acute osteomyelitis of the second third and fourth metatarsal stumps. There was also possible acute osteomyelitis of the first metatarsal stump. ? ?On 1/12 he underwent I&D with revision of transmetatarsal amputation and bone biopsy.  Operative culture grew 1 colony of staph lugdinensis. There was concern for residual osteomyelitis ? ?He was discharged to SNF on 1/22 on cefepime/flagyl with plans to continue through 2/22. He presented back to the ED 1/23 reporting inadequate care at the SNF. He was afebrile with WBC 7,300 and procalcitonin 0.16. He reported some queasiness and metallic taste in his mouth concerning for flagyl adverse events. He was changed to daptomycin and ertapenem to finish his course and sent back to the facility on 07/28/2021 ? ?He followed up with Dr. Bonney Roussel. He was also was seen in followup with Dr.Taylor with Podiatry with [facility] and later with Dr. Sharol Given here in [place] who has been happy with his progress. After finishing his  IV antibiotics he went back to chronic high dose suppressive therapy with amoxicillin 1 gram po bid. ? ?He told me that he did have 2 episodes of unexplained emesis in past month. ? ?Otherwise he has had no symptoms of systemic infection. ? ? ? ? ? ?Past Medical History:  ?Diagnosis Date  ? AICD (automatic cardioverter/defibrillator) present   ? Balanitis   ? +severe phimosis+ buried penis->circ not possible but dorsal slit done 10/2019  ? BPH (benign  prostatic hypertrophy)   ? with urinary retention.  Renal u/s 12/04/19 NORMAL KIDNEYS, NORMAL BLADDER, NO HYDRONEPHROSIS  ? CAD (coronary artery disease)   ? Nonobstructive by 12/2012 cath; then 03/2016 he required BMS to RCA ([facility]).  In-stent restenosis on cath 11/01/16, baloon angioplasty successful.  ? CATARACT, HX OF   ? Chronic combined systolic and diastolic CHF (congestive heart failure) ([place]) 05/2016  ? Ischemic CM.   04/2018 EF 40-45%, grd I DD.  07/2021 EF 55-60%, AV well seated  ? Chronic pain syndrome   ? Lumbar DDD; chronic neuropathic pain (DM); has spinal stimulator and sees pain mgmt MD  ? Complicated UTI (urinary tract infection) 09/2019  ? Phimoses, acute urinary retention, entoerococcus UTI, enterococc bacteremia.  F/u blood clx's neg x 5d.    ? COPD   ? Debilitated patient   ? Diabetes mellitus type 2 with complications ([place])   ? HbA1c jumped from 5.7% to 6.1% 03/2015---started metformin at that time.  DM 2 dx by fasting gluc criteria 2018.  Has chronic neuropath pain  ? Enterococcal bacteremia 09/2019  ? Phimoses, acute urinary retention, entoerococcus UTI, enterococc bacteremia.  F/u blood clx's neg x 5d.    ? Essential hypertension, benign   ? Fatty liver 2007  ? 2007 u/s showed fatty liver with hepatosplenomegaly.  2019 repeat u/s->fatty liver but no cirrhosis or hepatosplenomegaly.  ? Generalized weakness   ? GERD (gastroesophageal reflux disease)   ? + hx of esoph stenosis, +dilation  ? Glaucoma   ? GOUT   ? HH (hiatus hernia)   ? HYPERCHOLESTEROLEMIA-PURE   ? Hypogonadism male   ? ICD (implantable cardioverter-defibrillator) infection ([place]) 12/22/2019  ? 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 TAVR + RV pacer lead with vegetations->gram + cocci in chains, ?enterococcus (ID->Dr. Tommy Medal)  ? Lumbosacral neuritis   ? Lumbosacral spondylosis   ? Lumbar spinal stenosis with neurogenic claudication--contributes to his chronic pain syndrome  ? Morbid obesity ([place])   ? Normal memory function 08/2014  ? Neuropsychological testing ([facility]): no cognitive impairment or sign of neurodegenerative disorder.  Likely has adjustment d/o with mixed anxiety/depressed features and may benefit from low dose SNRI.    ? Normocytic anemia 03/2016  ? Mild-pt needs ferritin and vit B12 level checked (as of 03/22/16). Hb stable 09/2019  ? NSTEMI (non-ST elevated myocardial infarction) ([place]) 03/20/2016  ? BMS to  RCA  ? Obesity hypoventilation syndrome ([place])   ? Orthostatic hypotension   ? OSA on CPAP   ? 8 cm H2O  ? OSTEOARTHRITIS   ? Paroxysmal atrial fibrillation ([place]) 2003  ?  (? chronic?) Off anticoag for a while due to falls.  Then apixaban started 12/2014.  ? Peripheral neuropathy   ? 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 Ella Bodo): neuropathic pain->diff to treat, failed nucynta, failed spinal stimul trial, oxycontin hs + tramadol + gabap as of 12/2017 f/u Dr. Letta Pate.  ? Personal history of colonic adenoma 10/30/2012  ? Diminutive adenoma, consider repeat 2019 per GI  ? PFO (patent foramen ovale) 09/2019  ? small, with predominately L to R shunt  ? Physical deconditioning 02/23/2021  ? Presence of cardiac defibrillator 11/07/2017  ? Primary osteoarthritis of both knees   ? Bone on bone of medial compartments, + signif patellofemoral arth bilat.--supartz inj series started 09/12/17  ? 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 PUD (peptic ulcer disease)   ? PULMONARY HYPERTENSION, HX OF   ? Secondary male hypogonadism 2017  ?  Sepsis ([place]) 04/29/2020  ? Severe aortic stenosis   ? TAVR 04/11/16 ([facility])  ? Shortness of breath   ? with exertion: much improved s/p TAVR and treatment for CHF.  ? Sick sinus syndrome ([place])   ? PPM placed  ? Thrombocytopenia ([place]) 2018  ? HSM on 2007 abd u/s---suspect some mild splenic sequestration chronically.  ? Unspecified glaucoma(365.9)   ? Unspecified hereditary and idiopathic peripheral neuropathy approx age 65  ? bilat LE's, ? left arm, too.  Feet became progressively numb + left foot pain intermittently.  Pt may be trying a spinal stimulator (as of 05/2015)  ? VENOUS INSUFFICIENCY   ? Being followed by Dr. Sharol Given as of 10/2016 for two R LL venous stasis ulcers/skin tears.  Healed as of 10/30/16 f/u with Dr. Sharol Given.  ? VENTRAL HERNIA   ? ? ?Past Surgical History:  ?Procedure Laterality Date  ? AMPUTATION Left 04/11/2013  ? Procedure: AMPUTATION DIGIT Left 3rd toe;   Surgeon: [name], MD;  Location: [facility];  Service: Orthopedics;  Laterality: Left;  Left 3rd toe amputation at [facility]  ? AMPUTATION Left 08/29/2019  ? Procedure: LEFT TRANSMETATARSAL AMPUTATION;  Renford Dills

## 2021-10-27 ENCOUNTER — Other Ambulatory Visit: Payer: Self-pay | Admitting: Family Medicine

## 2021-10-30 ENCOUNTER — Other Ambulatory Visit: Payer: Self-pay | Admitting: Family Medicine

## 2021-10-31 ENCOUNTER — Ambulatory Visit: Payer: Medicare Other | Admitting: Orthopedic Surgery

## 2021-11-01 ENCOUNTER — Ambulatory Visit (INDEPENDENT_AMBULATORY_CARE_PROVIDER_SITE_OTHER): Payer: Medicare Other | Admitting: Orthopedic Surgery

## 2021-11-01 ENCOUNTER — Encounter: Payer: Self-pay | Admitting: Orthopedic Surgery

## 2021-11-01 DIAGNOSIS — L97521 Non-pressure chronic ulcer of other part of left foot limited to breakdown of skin: Secondary | ICD-10-CM | POA: Diagnosis not present

## 2021-11-01 NOTE — Progress Notes (Signed)
? ?Office Visit Note ?  ?Patient: Tim [name]           ?Date of Birth: [birth year]           ?MRN: [id number] ?Visit Date: 11/01/2021 ?             ?Requested by: Tammi Sou, MD ?[phone] [address] ?[place],  [place] [address] ?PCP: Tammi Sou, MD ? ?Chief Complaint  ?Patient presents with  ? Left Foot - Wound Check  ? ? ? ? ?HPI: ?Patient is seen in follow-up for venous stasis swelling as well as callus left transmetatarsal amputation patient is currently wearing a knee-high compression stocking.  Of note patient states that the leads for his defibrillator are infected as well as his prosthetic valve. ? ?Assessment & Plan: ?Visit Diagnoses:  ?1. Non-pressure chronic ulcer of other part of left foot limited to breakdown of skin ([place])   ? ? ?Plan: Callus was pared no signs of ulcer or abscess.  Continue with compression stocking follow-up in 2 months. ? ?Follow-Up Instructions: Return in about 2 months (around 01/01/2022).  ? ?Ortho Exam ? ?[name], oriented, no adenopathy, well-dressed, normal affect, normal respiratory effort. ?Examination patient has pitting edema with venous insufficiency with weeping edema but no open ulcers no cellulitis left leg.  He has hypertrophic callus over the residual transmetatarsal amputation.  After informed consent a 10 blade knife was used to pare the callus.  There is no open wound no cellulitis no drainage. ? ?Imaging: ?No results found. ?No images are attached to the encounter. ? ?Labs: ?Lab Results  ?Component Value Date  ? HGBA1C 6.1 10/13/2021  ? HGBA1C 6.2 05/11/2021  ? HGBA1C 6.0 02/09/2021  ? ESRSEDRATE 34 (H) 11/24/2020  ? ESRSEDRATE 38 (H) 07/14/2020  ? ESRSEDRATE 20 02/09/2016  ? CRP 1.3 11/24/2020  ? CRP 0.9 07/14/2020  ? CRP 0.7 05/03/2020  ? LABURIC 3.5 (L) 09/10/2013  ? REPTSTATUS 05/04/2020 FINAL 04/28/2020  ? CULT  04/28/2020  ?  NO GROWTH 5 DAYS ?Performed at [facility], [place] [address] [place], [place] [address] ?  ? LABORGA  ENTEROCOCCUS FAECALIS 09/11/2019  ? ? ? ?Lab Results  ?Component Value Date  ? ALBUMIN 3.4 (L) 10/13/2021  ? ALBUMIN 3.6 11/01/2020  ? ALBUMIN 3.0 (L) 05/07/2020  ? ? ?Lab Results  ?Component Value Date  ? MG 1.7 05/07/2020  ? MG 1.6 (L) 05/06/2020  ? MG 1.8 05/05/2020  ? ?No results found for: VD25OH ? ?No results found for: PREALBUMIN ? ?  Latest Ref Rng & Units 10/13/2021  ?  3:20 PM 07/23/2021  ? 12:00 AM 05/11/2021  ?  2:09 PM  ?CBC EXTENDED  ?WBC 4.0 - 10.5 K/uL 6.1   7.1      6.1    ?RBC 4.22 - 5.81 Mil/uL 3.66    4.10    ?Hemoglobin 13.0 - 17.0 g/dL 11.2   9.1      12.2    ?HCT 39.0 - 52.0 % 33.1   27      37.0    ?Platelets 150.0 - 400.0 K/uL 133.0   112      122.0    ?  ? This result is from an external source.  ? ? ? ?There is no height or weight on file to calculate BMI. ? ?Orders:  ?No orders of the defined types were placed in this encounter. ? ?No orders of the defined types were  placed in this encounter. ? ? ? Procedures: ?No procedures performed ? ?Clinical Data: ?No additional findings. ? ?ROS: ? ?All other systems negative, except as noted in the HPI. ?Review of Systems ? ?Objective: ?Vital Signs: There were no vitals taken for this visit. ? ?Specialty Comments:  ?No specialty comments available. ? ?PMFS History: ?Patient Active Problem List  ? Diagnosis Date Noted  ? Weakness 02/23/2021  ? Physical deconditioning 02/23/2021  ? Rhinitis 07/14/2020  ? Mitral valve vegetation 05/28/2020  ? Non-pressure chronic ulcer of other part of left foot limited to breakdown of skin ([place])   ? Esophageal dysphagia   ? Bacteremia   ? Endocarditis of mitral valve   ? Fever 04/28/2020  ? Cervical lymphadenopathy 03/15/2020  ? 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 ICD (implantable cardioverter-defibrillator) infection ([place]) 12/22/2019  ? Gram-positive bacteremia 11/27/2019  ? FUO (fever of unknown origin) 11/26/2019  ? Low blood pressure reading 11/05/2019  ? Wound dehiscence 10/22/2019  ? PFO (patent  foramen ovale) 10/06/2019  ? ICD (implantable cardioverter-defibrillator) battery depletion   ? Pressure injury of skin 09/13/2019  ? Enterococcal bacteremia history  09/13/2019  ? 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  ? 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 Urinary retention 09/13/2019  ? Phimosis 09/13/2019  ? SIRS (systemic inflammatory response syndrome) ([place]) 09/12/2019  ? Sepsis ([place]) 09/12/2019  ? Enterococcus UTI 09/12/2019  ? Cellulitis of foot   ? History of transmetatarsal amputation of left foot ([place])   ? Subacute osteomyelitis, left ankle and foot ([place])   ? 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 ([place]) 04/17/2018  ? Diabetic neuropathy, painful ([place]) 12/26/2016  ? Neuropathic pain of foot, left 06/20/2016  ? Insulin resistance 07/22/2015  ? Status post amputation of left great toe ([place]) 06/08/2015  ? Spinal stenosis of lumbar region 02/16/2015  ? Hereditary and idiopathic peripheral neuropathy 01/12/2015  ? Oral thrush 12/28/2014  ? Orthostatic dizziness 05/24/2014  ? Paresthesias with subjective weakness 01/30/2014  ? Bilateral thigh pain 01/30/2014  ? Abdominal wall pain in right upper quadrant 01/22/2014  ? Gross hematuria 09/25/2013  ? Intertrigo 09/25/2013  ? IBS (irritable bowel syndrome) 09/11/2013  ? Chronic pain syndrome 08/06/2013  ? CAD (coronary artery disease), native coronary artery 12/25/2012  ? Paroxysmal atrial fibrillation ([place]) 12/25/2012  ? Moderate aortic stenosis 12/25/2012  ? Aortic stenosis 12/24/2012  ? Personal history of colonic adenoma 10/30/2012  ? Diverticulosis of colon (without mention of hemorrhage) 10/30/2012  ? Internal hemorrhoids 10/30/2012  ? Change in bowel habits intermittent loose stools 10/30/2012  ? Routine health maintenance 06/09/2012  ? Hereditary peripheral neuropathy 10/10/2011  ? Long term current use of anticoagulant - apixiban 08/16/2010  ? Dyspnea  on exertion 09/08/2009  ? HYPERCHOLESTEROLEMIA-PURE 01/13/2009  ? Essential hypertension, benign 01/13/2009  ? EDEMA 01/13/2009  ? GOUT 01/12/2009  ? GLAUCOMA 01/12/2009  ? Venous insufficiency (chronic) (peripheral) 01/12/2009  ? COPD (chronic obstructive pulmonary disease) ([place]) 01/12/2009  ? VENTRAL HERNIA 01/12/2009  ? OSTEOARTHRITIS 01/12/2009  ? ARTHRITIS 01/12/2009  ? [place] DISEASE 01/12/2009  ? CATARACT, HX OF 01/12/2009  ? HEART MURMUR, HX OF 01/12/2009  ? BENIGN PROSTATIC HYPERTROPHY, HX OF 01/12/2009  ? Obesity 01/17/2007  ? OBSTRUCTIVE SLEEP APNEA 01/17/2007  ? ASTHMA 01/17/2007  ? GERD 01/17/2007  ? HALLUX VALGUS, ACQUIRED 01/17/2007  ? Pulmonary hypertension ([place]) 01/17/2007  ? ?Past Medical History:  ?Diagnosis Date  ? AICD (automatic cardioverter/defibrillator) present   ?  Balanitis   ? +severe phimosis+ buried penis->circ not possible but dorsal slit done 10/2019  ? BPH (benign prostatic hypertrophy)   ? with urinary retention.  Renal u/s 12/04/19 NORMAL KIDNEYS, NORMAL BLADDER, NO HYDRONEPHROSIS  ? CAD (coronary artery disease)   ? Nonobstructive by 12/2012 cath; then 03/2016 he required BMS to RCA ([facility]).  In-stent restenosis on cath 11/01/16, baloon angioplasty successful.  ? CATARACT, HX OF   ? Chronic combined systolic and diastolic CHF (congestive heart failure) ([place]) 05/2016  ? Ischemic CM.  04/2018 EF 40-45%, grd I DD.  07/2021 EF 55-60%, AV well seated  ? Chronic pain syndrome   ? Lumbar DDD; chronic neuropathic pain (DM); has spinal stimulator and sees pain mgmt MD  ? Complicated UTI (urinary tract infection) 09/2019  ? Phimoses, acute urinary retention, entoerococcus UTI, enterococc bacteremia.  F/u blood clx's neg x 5d.    ? COPD   ? Debilitated patient   ? Diabetes mellitus type 2 with complications ([place])   ? HbA1c jumped from 5.7% to 6.1% 03/2015---started metformin at that time.  DM 2 dx by fasting gluc criteria 2018.  Has chronic neuropath pain  ? Enterococcal bacteremia  09/2019  ? Phimoses, acute urinary retention, entoerococcus UTI, enterococc bacteremia.  F/u blood clx's neg x 5d.    ? Essential hypertension, benign   ? Fatty liver 2007  ? 2007 u/s showed fatty liver with hepatosplen

## 2021-11-16 ENCOUNTER — Other Ambulatory Visit: Payer: Self-pay

## 2021-11-16 ENCOUNTER — Encounter: Payer: Self-pay | Admitting: Infectious Disease

## 2021-11-16 ENCOUNTER — Ambulatory Visit (INDEPENDENT_AMBULATORY_CARE_PROVIDER_SITE_OTHER): Payer: Medicare Other | Admitting: Infectious Disease

## 2021-11-16 VITALS — BP 109/70 | HR 82 | Temp 97.5°F

## 2021-11-16 DIAGNOSIS — T827XXD Infection and inflammatory reaction due to other cardiac and vascular devices, implants and grafts, subsequent encounter: Secondary | ICD-10-CM

## 2021-11-16 DIAGNOSIS — M86272 Subacute osteomyelitis, left ankle and foot: Secondary | ICD-10-CM

## 2021-11-16 DIAGNOSIS — T826XXD Infection and inflammatory reaction due to cardiac valve prosthesis, subsequent encounter: Secondary | ICD-10-CM

## 2021-11-16 DIAGNOSIS — Z7185 Encounter for immunization safety counseling: Secondary | ICD-10-CM

## 2021-11-16 DIAGNOSIS — Z952 Presence of prosthetic heart valve: Secondary | ICD-10-CM | POA: Diagnosis not present

## 2021-11-16 DIAGNOSIS — Z23 Encounter for immunization: Secondary | ICD-10-CM

## 2021-11-16 DIAGNOSIS — I38 Endocarditis, valve unspecified: Secondary | ICD-10-CM | POA: Diagnosis not present

## 2021-11-16 HISTORY — DX: Encounter for immunization safety counseling: Z71.85

## 2021-11-16 MED ORDER — AMOXICILLIN 500 MG PO TABS: 1000.0000 mg | ORAL_TABLET | Freq: Two times a day (BID) | ORAL | 11 refills | Status: DC

## 2021-11-16 NOTE — Progress Notes (Signed)
? ?  Covid-19 Vaccination Clinic ? ?Name:  KEONTA ALSIP    ?MRN: [id number] ?DOB: [birth year] ? ?11/16/2021 ? ?Mr. Duecker was observed post Covid-19 immunization for 15 minutes without incident. He was provided with Vaccine Information Sheet and instruction to access the V-Safe system.  ? ?Mr. Lua was instructed to call 911 with any severe reactions post vaccine: ?Difficulty breathing  ?Swelling of face and throat  ?A fast heartbeat  ?A bad rash all over body  ?Dizziness and weakness  ? ?  ?Varney Daily, PharmD ?PGY1 Pharmacy Resident ? ?Please check AMION for all [facility] pharmacy phone numbers ?After 10:00 PM call main pharmacy [phone] ? ?

## 2021-11-16 NOTE — Progress Notes (Signed)
? ?Subjective:  ?Chief complaint: follow-up for PVE pacemaker infection and osteomyelitis of foot ? ? ? Patient ID: Tim [name], male    DOB: 1941/01/25, 81 y.o.   MRN: [id number] ? ?HPI ? ?81 y.o. male who  initially presented with AMP sensitive enterococcal UTI complicated by enterococcal bacteremia in March 2021 in the setting of AICD and TAVR. TTE and TEE at the time were without evidence of vegetation and was prescribed 2 weeks of ampicillin. On follow up on 4/21 there was worsening of his amputation site with erythema and drainage. He was placed on doxycycline by Dr. Sharol Given which was then changed to Zyvox by me. This resolved his symptoms. On follow up with myself  on 11/26/2019 he was experiencing fevers and dizziness with concern for systemic infection. Blood cultures in the ID clinic were positive for Enterococcus faecalis bacteremia. Surgical site was well healed at that time. 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 Recommended treatment was 6 weeks of ampicillin and ceftriaxone through 6/21. Feeling better on 6/21 and transitioned to life-long suppression with amoxicillin three times daily.  ?  ?Mr. Bovard was then seen on 7/15 /2021 seen by Dr. Johnnye Sima with concern for cellulitis and was continued on his Amoxicillin. He followed up with Dr. Johnnye Sima on 8/6 and continued to do well with Amoxicillin.  ? ?Blood cultures no growth but MV had vegetation. TEE was not successful.  He was retreated with ampicillin high-dose and ceftriaxone twice daily. ? ?He completed 6 weeks of therapy and  was then on  high-dose amoxicillin to 1000 g twice daily. ? ?Followed very closely by Dr. Sharol Given for his chronic surgical wound site where he had osteomyelitis in that area is been doing well according to him and also going to Dr. Jess Barters notes. ? ?Since I last saw him he was admitted to [facility] with infection of his foot having presented with redness and drainage from the left TMA site. Blood cultures  from admission were negative. CT of the left foot had cellulitis of the distal forefoot with abscess and acute osteomyelitis of the second third and fourth metatarsal stumps. There was also possible acute osteomyelitis of the first metatarsal stump. ? ?On 1/12 he underwent I&D with revision of transmetatarsal amputation and bone biopsy.  Operative culture grew 1 colony of staph lugdinensis. There was concern for residual osteomyelitis ? ?He was discharged to SNF on 1/22 on cefepime/flagyl with plans to continue through 2/22. He presented back to the ED 1/23 reporting inadequate care at the SNF. He was afebrile with WBC 7,300 and procalcitonin 0.16. He reported some queasiness and metallic taste in his mouth concerning for flagyl adverse events. He was changed to daptomycin and ertapenem to finish his course and sent back to the facility on 07/28/2021 ? ?He followed up with Dr. Bonney Roussel. He was also was seen in followup with Dr.Taylor with Podiatry with [facility] and later with Dr. Sharol Given here in [place] who has been happy with his progress. After finishing his  IV antibiotics he went back to chronic high dose suppressive therapy with amoxicillin 1 gram po bid. ? ?[name] is doing really well an high-dose amoxicillin.  He is following with Dr. Sharol Given closely with regards to his wound and has no evidence of infection there. ? ? ? ? ? ? ?Past Medical History:  ?Diagnosis Date  ? AICD (automatic cardioverter/defibrillator) present   ? Balanitis   ? +severe phimosis+ buried penis->circ not possible but dorsal slit done 10/2019  ?  BPH (benign prostatic hypertrophy)   ? with urinary retention.  Renal u/s 12/04/19 NORMAL KIDNEYS, NORMAL BLADDER, NO HYDRONEPHROSIS  ? CAD (coronary artery disease)   ? Nonobstructive by 12/2012 cath; then 03/2016 he required BMS to RCA ([facility]).  In-stent restenosis on cath 11/01/16, baloon angioplasty successful.  ? CATARACT, HX OF   ? Chronic combined systolic and diastolic CHF (congestive heart failure) ([place])  05/2016  ? Ischemic CM.  04/2018 EF 40-45%, grd I DD.  07/2021 EF 55-60%, AV well seated  ? Chronic pain syndrome   ? Lumbar DDD; chronic neuropathic pain (DM); has spinal stimulator and sees pain mgmt MD  ? Complicated UTI (urinary tract infection) 09/2019  ? Phimoses, acute urinary retention, entoerococcus UTI, enterococc bacteremia.  F/u blood clx's neg x 5d.    ? COPD   ? Debilitated patient   ? Diabetes mellitus type 2 with complications ([place])   ? HbA1c jumped from 5.7% to 6.1% 03/2015---started metformin at that time.  DM 2 dx by fasting gluc criteria 2018.  Has chronic neuropath pain  ? Enterococcal bacteremia 09/2019  ? Phimoses, acute urinary retention, entoerococcus UTI, enterococc bacteremia.  F/u blood clx's neg x 5d.    ? Essential hypertension, benign   ? Fatty liver 2007  ? 2007 u/s showed fatty liver with hepatosplenomegaly.  2019 repeat u/s->fatty liver but no cirrhosis or hepatosplenomegaly.  ? Generalized weakness   ? GERD (gastroesophageal reflux disease)   ? + hx of esoph stenosis, +dilation  ? Glaucoma   ? GOUT   ? HH (hiatus hernia)   ? HYPERCHOLESTEROLEMIA-PURE   ? Hypogonadism male   ? ICD (implantable cardioverter-defibrillator) infection ([place]) 12/22/2019  ? 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 TAVR + RV pacer lead with vegetations->gram + cocci in chains, ?enterococcus (ID->Dr. Tommy Medal)  ? Lumbosacral neuritis   ? Lumbosacral spondylosis   ? Lumbar spinal stenosis with neurogenic claudication--contributes to his chronic pain syndrome  ? Morbid obesity ([place])   ? Normal memory function 08/2014  ? Neuropsychological testing ([facility]): no cognitive impairment or sign of neurodegenerative disorder.  Likely has adjustment d/o with mixed anxiety/depressed features and may benefit from low dose SNRI.    ? Normocytic anemia 03/2016  ? Mild-pt needs ferritin and vit B12 level checked (as of 03/22/16). Hb stable 09/2019  ? NSTEMI (non-ST elevated myocardial infarction)  ([place]) 03/20/2016  ? BMS to RCA  ? Obesity hypoventilation syndrome ([place])   ? Orthostatic hypotension   ? OSA on CPAP   ? 8 cm H2O  ? OSTEOARTHRITIS   ? Paroxysmal atrial fibrillation ([place]) 2003  ?  (? chronic?) Off anticoag for a while due to falls.  Then apixaban started 12/2014.  ? Peripheral neuropathy   ? 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 Ella Bodo): neuropathic pain->diff to treat, failed nucynta, failed spinal stimul trial, oxycontin hs + tramadol + gabap as of 12/2017 f/u Dr. Letta Pate.  ? Personal history of colonic adenoma 10/30/2012  ? Diminutive adenoma, consider repeat 2019 per GI  ? PFO (patent foramen ovale) 09/2019  ? small, with predominately L to R shunt  ? Physical deconditioning 02/23/2021  ? Presence of cardiac defibrillator 11/07/2017  ? Primary osteoarthritis of both knees   ? Bone on bone of medial compartments, + signif patellofemoral arth bilat.--supartz inj series started 09/12/17  ? 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 PUD (peptic ulcer disease)   ? PULMONARY HYPERTENSION, HX OF   ? Secondary male hypogonadism  2017  ? Sepsis ([place]) 04/29/2020  ? Severe aortic stenosis   ? TAVR 04/11/16 ([facility])  ? Shortness of breath   ? with exertion: much improved s/p TAVR and treatment for CHF.  ? Sick sinus syndrome ([place])   ? PPM placed  ? Thrombocytopenia ([place]) 2018  ? HSM on 2007 abd u/s---suspect some mild splenic sequestration chronically.  ? Unspecified glaucoma(365.9)   ? Unspecified hereditary and idiopathic peripheral neuropathy approx age 45  ? bilat LE's, ? left arm, too.  Feet became progressively numb + left foot pain intermittently.  Pt may be trying a spinal stimulator (as of 05/2015)  ? VENOUS INSUFFICIENCY   ? Being followed by Dr. Sharol Given as of 10/2016 for two R LL venous stasis ulcers/skin tears.  Healed as of 10/30/16 f/u with Dr. Sharol Given.  ? VENTRAL HERNIA   ? ? ?Past Surgical History:  ?Procedure Laterality Date  ? AMPUTATION Left 04/11/2013  ? Procedure:  AMPUTATION DIGIT Left 3rd toe;  Surgeon: [name], MD;  Location: [facility];  Service: Orthopedics;  Laterality: Left;  Left 3rd toe amputation at [facility]  ? AMPUTATION Left 08/29/2019  ? Procedure: LEFT TRANSMETATAR

## 2021-11-30 ENCOUNTER — Other Ambulatory Visit: Payer: Self-pay | Admitting: Family Medicine

## 2021-12-14 ENCOUNTER — Observation Stay (HOSPITAL_COMMUNITY)
Admission: EM | Admit: 2021-12-14 | Discharge: 2021-12-15 | Disposition: A | Discharge status: Home / Self Care | Payer: Medicare Other | Attending: Internal Medicine | Admitting: Internal Medicine

## 2021-12-14 ENCOUNTER — Other Ambulatory Visit: Payer: Self-pay

## 2021-12-14 ENCOUNTER — Encounter (HOSPITAL_COMMUNITY): Payer: Self-pay

## 2021-12-14 DIAGNOSIS — Z952 Presence of prosthetic heart valve: Secondary | ICD-10-CM | POA: Diagnosis not present

## 2021-12-14 DIAGNOSIS — Z89422 Acquired absence of other left toe(s): Secondary | ICD-10-CM | POA: Insufficient documentation

## 2021-12-14 DIAGNOSIS — R7989 Other specified abnormal findings of blood chemistry: Secondary | ICD-10-CM | POA: Insufficient documentation

## 2021-12-14 DIAGNOSIS — N400 Enlarged prostate without lower urinary tract symptoms: Secondary | ICD-10-CM | POA: Diagnosis not present

## 2021-12-14 DIAGNOSIS — Z8269 Family history of other diseases of the musculoskeletal system and connective tissue: Secondary | ICD-10-CM | POA: Insufficient documentation

## 2021-12-14 DIAGNOSIS — I110 Hypertensive heart disease with heart failure: Secondary | ICD-10-CM | POA: Diagnosis not present

## 2021-12-14 DIAGNOSIS — Z95810 Presence of automatic (implantable) cardiac defibrillator: Secondary | ICD-10-CM | POA: Insufficient documentation

## 2021-12-14 DIAGNOSIS — Z792 Long term (current) use of antibiotics: Secondary | ICD-10-CM | POA: Insufficient documentation

## 2021-12-14 DIAGNOSIS — J449 Chronic obstructive pulmonary disease, unspecified: Secondary | ICD-10-CM | POA: Insufficient documentation

## 2021-12-14 DIAGNOSIS — Y848 Other medical procedures as the cause of abnormal reaction of the patient, or of later complication, without mention of misadventure at the time of the procedure: Secondary | ICD-10-CM | POA: Diagnosis not present

## 2021-12-14 DIAGNOSIS — Z79899 Other long term (current) drug therapy: Secondary | ICD-10-CM | POA: Insufficient documentation

## 2021-12-14 DIAGNOSIS — D696 Thrombocytopenia, unspecified: Secondary | ICD-10-CM | POA: Diagnosis present

## 2021-12-14 DIAGNOSIS — I480 Paroxysmal atrial fibrillation: Secondary | ICD-10-CM | POA: Diagnosis not present

## 2021-12-14 DIAGNOSIS — R7881 Bacteremia: Secondary | ICD-10-CM | POA: Diagnosis present

## 2021-12-14 DIAGNOSIS — I2510 Atherosclerotic heart disease of native coronary artery without angina pectoris: Secondary | ICD-10-CM | POA: Insufficient documentation

## 2021-12-14 DIAGNOSIS — Z8619 Personal history of other infectious and parasitic diseases: Secondary | ICD-10-CM | POA: Insufficient documentation

## 2021-12-14 DIAGNOSIS — R7401 Elevation of levels of liver transaminase levels: Secondary | ICD-10-CM | POA: Diagnosis not present

## 2021-12-14 DIAGNOSIS — Z8249 Family history of ischemic heart disease and other diseases of the circulatory system: Secondary | ICD-10-CM | POA: Insufficient documentation

## 2021-12-14 DIAGNOSIS — M7989 Other specified soft tissue disorders: Secondary | ICD-10-CM | POA: Diagnosis present

## 2021-12-14 DIAGNOSIS — E119 Type 2 diabetes mellitus without complications: Secondary | ICD-10-CM | POA: Insufficient documentation

## 2021-12-14 DIAGNOSIS — I951 Orthostatic hypotension: Secondary | ICD-10-CM | POA: Insufficient documentation

## 2021-12-14 DIAGNOSIS — E1142 Type 2 diabetes mellitus with diabetic polyneuropathy: Secondary | ICD-10-CM | POA: Insufficient documentation

## 2021-12-14 DIAGNOSIS — I878 Other specified disorders of veins: Secondary | ICD-10-CM | POA: Insufficient documentation

## 2021-12-14 DIAGNOSIS — Z950 Presence of cardiac pacemaker: Secondary | ICD-10-CM | POA: Insufficient documentation

## 2021-12-14 DIAGNOSIS — S81801A Unspecified open wound, right lower leg, initial encounter: Secondary | ICD-10-CM | POA: Diagnosis present

## 2021-12-14 DIAGNOSIS — J3489 Other specified disorders of nose and nasal sinuses: Secondary | ICD-10-CM | POA: Insufficient documentation

## 2021-12-14 DIAGNOSIS — I9589 Other hypotension: Secondary | ICD-10-CM | POA: Insufficient documentation

## 2021-12-14 DIAGNOSIS — R011 Cardiac murmur, unspecified: Secondary | ICD-10-CM | POA: Insufficient documentation

## 2021-12-14 DIAGNOSIS — Z8679 Personal history of other diseases of the circulatory system: Secondary | ICD-10-CM | POA: Insufficient documentation

## 2021-12-14 DIAGNOSIS — Z7984 Long term (current) use of oral hypoglycemic drugs: Secondary | ICD-10-CM | POA: Insufficient documentation

## 2021-12-14 DIAGNOSIS — Z7982 Long term (current) use of aspirin: Secondary | ICD-10-CM | POA: Insufficient documentation

## 2021-12-14 DIAGNOSIS — I872 Venous insufficiency (chronic) (peripheral): Secondary | ICD-10-CM | POA: Diagnosis not present

## 2021-12-14 DIAGNOSIS — I5042 Chronic combined systolic (congestive) and diastolic (congestive) heart failure: Secondary | ICD-10-CM | POA: Insufficient documentation

## 2021-12-14 DIAGNOSIS — Z6841 Body Mass Index (BMI) 40.0 and over, adult: Secondary | ICD-10-CM | POA: Diagnosis not present

## 2021-12-14 DIAGNOSIS — R7303 Prediabetes: Secondary | ICD-10-CM | POA: Diagnosis present

## 2021-12-14 DIAGNOSIS — Z66 Do not resuscitate: Secondary | ICD-10-CM | POA: Insufficient documentation

## 2021-12-14 DIAGNOSIS — R531 Weakness: Secondary | ICD-10-CM | POA: Insufficient documentation

## 2021-12-14 DIAGNOSIS — Z955 Presence of coronary angioplasty implant and graft: Secondary | ICD-10-CM | POA: Insufficient documentation

## 2021-12-14 DIAGNOSIS — D849 Immunodeficiency, unspecified: Secondary | ICD-10-CM | POA: Insufficient documentation

## 2021-12-14 DIAGNOSIS — Z8614 Personal history of Methicillin resistant Staphylococcus aureus infection: Secondary | ICD-10-CM | POA: Insufficient documentation

## 2021-12-14 DIAGNOSIS — R2689 Other abnormalities of gait and mobility: Secondary | ICD-10-CM | POA: Insufficient documentation

## 2021-12-14 DIAGNOSIS — L03115 Cellulitis of right lower limb: Principal | ICD-10-CM | POA: Insufficient documentation

## 2021-12-14 DIAGNOSIS — E1136 Type 2 diabetes mellitus with diabetic cataract: Secondary | ICD-10-CM | POA: Insufficient documentation

## 2021-12-14 DIAGNOSIS — I959 Hypotension, unspecified: Secondary | ICD-10-CM | POA: Diagnosis present

## 2021-12-14 DIAGNOSIS — I5032 Chronic diastolic (congestive) heart failure: Secondary | ICD-10-CM | POA: Diagnosis not present

## 2021-12-14 DIAGNOSIS — Z87898 Personal history of other specified conditions: Secondary | ICD-10-CM | POA: Diagnosis not present

## 2021-12-14 DIAGNOSIS — E66813 Obesity, class 3: Secondary | ICD-10-CM | POA: Diagnosis present

## 2021-12-14 DIAGNOSIS — M549 Dorsalgia, unspecified: Secondary | ICD-10-CM | POA: Insufficient documentation

## 2021-12-14 DIAGNOSIS — D649 Anemia, unspecified: Secondary | ICD-10-CM | POA: Insufficient documentation

## 2021-12-14 DIAGNOSIS — G4733 Obstructive sleep apnea (adult) (pediatric): Secondary | ICD-10-CM | POA: Insufficient documentation

## 2021-12-14 DIAGNOSIS — L0390 Cellulitis, unspecified: Secondary | ICD-10-CM | POA: Diagnosis present

## 2021-12-14 DIAGNOSIS — B952 Enterococcus as the cause of diseases classified elsewhere: Secondary | ICD-10-CM | POA: Diagnosis present

## 2021-12-14 DIAGNOSIS — Z85038 Personal history of other malignant neoplasm of large intestine: Secondary | ICD-10-CM | POA: Insufficient documentation

## 2021-12-14 DIAGNOSIS — I5030 Unspecified diastolic (congestive) heart failure: Secondary | ICD-10-CM | POA: Diagnosis present

## 2021-12-14 DIAGNOSIS — E7800 Pure hypercholesterolemia, unspecified: Secondary | ICD-10-CM | POA: Insufficient documentation

## 2021-12-14 DIAGNOSIS — E1169 Type 2 diabetes mellitus with other specified complication: Secondary | ICD-10-CM | POA: Insufficient documentation

## 2021-12-14 DIAGNOSIS — M869 Osteomyelitis, unspecified: Secondary | ICD-10-CM | POA: Insufficient documentation

## 2021-12-14 DIAGNOSIS — Z7901 Long term (current) use of anticoagulants: Secondary | ICD-10-CM | POA: Diagnosis not present

## 2021-12-14 DIAGNOSIS — Z881 Allergy status to other antibiotic agents status: Secondary | ICD-10-CM | POA: Insufficient documentation

## 2021-12-14 DIAGNOSIS — G894 Chronic pain syndrome: Secondary | ICD-10-CM | POA: Insufficient documentation

## 2021-12-14 DIAGNOSIS — R600 Localized edema: Secondary | ICD-10-CM | POA: Insufficient documentation

## 2021-12-14 LAB — CBC WITH DIFFERENTIAL/PLATELET
Abs Immature Granulocytes: 0.01 10*3/uL (ref 0.00–0.07)
Basophils Absolute: 0.1 10*3/uL (ref 0.0–0.1)
Basophils Relative: 1 %
Eosinophils Absolute: 0.2 10*3/uL (ref 0.0–0.5)
Eosinophils Relative: 3 %
HCT: 35.5 % — ABNORMAL LOW (ref 39.0–52.0)
Hemoglobin: 11.7 g/dL — ABNORMAL LOW (ref 13.0–17.0)
Immature Granulocytes: 0 %
Lymphocytes Relative: 28 %
Lymphs Abs: 1.7 10*3/uL (ref 0.7–4.0)
MCH: 30.2 pg (ref 26.0–34.0)
MCHC: 33 g/dL (ref 30.0–36.0)
MCV: 91.5 fL (ref 80.0–100.0)
Monocytes Absolute: 0.8 10*3/uL (ref 0.1–1.0)
Monocytes Relative: 12 %
Neutro Abs: 3.5 10*3/uL (ref 1.7–7.7)
Neutrophils Relative %: 56 %
Platelets: 132 10*3/uL — ABNORMAL LOW (ref 150–400)
RBC: 3.88 MIL/uL — ABNORMAL LOW (ref 4.22–5.81)
RDW: 14.3 % (ref 11.5–15.5)
WBC: 6.2 10*3/uL (ref 4.0–10.5)
nRBC: 0 % (ref 0.0–0.2)

## 2021-12-14 LAB — LACTIC ACID, PLASMA
Lactic Acid, Venous: 0.9 mmol/L (ref 0.5–1.9)
Lactic Acid, Venous: 1 mmol/L (ref 0.5–1.9)

## 2021-12-14 LAB — COMPREHENSIVE METABOLIC PANEL
ALT: 39 U/L (ref 0–44)
AST: 58 U/L — ABNORMAL HIGH (ref 15–41)
Albumin: 3.2 g/dL — ABNORMAL LOW (ref 3.5–5.0)
Alkaline Phosphatase: 58 U/L (ref 38–126)
Anion gap: 11 (ref 5–15)
BUN: 17 mg/dL (ref 8–23)
CO2: 24 mmol/L (ref 22–32)
Calcium: 9 mg/dL (ref 8.9–10.3)
Chloride: 104 mmol/L (ref 98–111)
Creatinine, Ser: 0.89 mg/dL (ref 0.61–1.24)
GFR, Estimated: >60 mL/min (ref >60)
Glucose, Bld: 143 mg/dL — ABNORMAL HIGH (ref 70–99)
Potassium: 4 mmol/L (ref 3.5–5.1)
Sodium: 139 mmol/L (ref 135–145)
Total Bilirubin: 0.8 mg/dL (ref 0.3–1.2)
Total Protein: 6.1 g/dL — ABNORMAL LOW (ref 6.5–8.1)

## 2021-12-14 LAB — URINALYSIS, ROUTINE W REFLEX MICROSCOPIC
Bilirubin Urine: NEGATIVE
Glucose, UA: NEGATIVE mg/dL
Hgb urine dipstick: NEGATIVE
Ketones, ur: 5 mg/dL — AB
Leukocytes,Ua: NEGATIVE
Nitrite: NEGATIVE
Protein, ur: NEGATIVE mg/dL
Specific Gravity, Urine: 1.019 (ref 1.005–1.030)
pH: 5 (ref 5.0–8.0)

## 2021-12-14 LAB — BRAIN NATRIURETIC PEPTIDE: B Natriuretic Peptide: 51.5 pg/mL (ref 0.0–100.0)

## 2021-12-14 MED ORDER — FINASTERIDE 5 MG PO TABS
5.0000 mg | ORAL_TABLET | Freq: Every day | ORAL | Status: DC
  Administered 2021-12-15: 5 mg via ORAL
  Filled 2021-12-14 (×2): qty 1

## 2021-12-14 MED ORDER — SODIUM CHLORIDE 0.9 % IV SOLN: Freq: Once | INTRAVENOUS | Status: AC

## 2021-12-14 MED ORDER — APIXABAN 5 MG PO TABS
5.0000 mg | ORAL_TABLET | Freq: Two times a day (BID) | ORAL | Status: DC
  Administered 2021-12-14 – 2021-12-15 (×3): 5 mg via ORAL
  Filled 2021-12-14 (×3): qty 1

## 2021-12-14 MED ORDER — ACETAMINOPHEN 325 MG PO TABS: 650.0000 mg | ORAL_TABLET | Freq: Four times a day (QID) | ORAL | Status: DC | PRN

## 2021-12-14 MED ORDER — SODIUM CHLORIDE 0.9% FLUSH
3.0000 mL | Freq: Two times a day (BID) | INTRAVENOUS | Status: DC
  Administered 2021-12-14 – 2021-12-15 (×2): 3 mL via INTRAVENOUS

## 2021-12-14 MED ORDER — ROSUVASTATIN CALCIUM 20 MG PO TABS
40.0000 mg | ORAL_TABLET | Freq: Every day | ORAL | Status: DC
  Administered 2021-12-14 – 2021-12-15 (×2): 40 mg via ORAL
  Filled 2021-12-14 (×2): qty 2

## 2021-12-14 MED ORDER — ALLOPURINOL 300 MG PO TABS
300.0000 mg | ORAL_TABLET | Freq: Every day | ORAL | Status: DC
  Administered 2021-12-15: 300 mg via ORAL
  Filled 2021-12-14: qty 3
  Filled 2021-12-14: qty 1

## 2021-12-14 MED ORDER — PROBIOTIC DAILY PO CAPS: ORAL_CAPSULE | Freq: Every day | ORAL | Status: DC

## 2021-12-14 MED ORDER — ALBUTEROL SULFATE (2.5 MG/3ML) 0.083% IN NEBU: 2.5000 mg | INHALATION_SOLUTION | Freq: Four times a day (QID) | RESPIRATORY_TRACT | Status: DC | PRN

## 2021-12-14 MED ORDER — GABAPENTIN 400 MG PO CAPS
800.0000 mg | ORAL_CAPSULE | Freq: Four times a day (QID) | ORAL | Status: DC
  Administered 2021-12-14 – 2021-12-15 (×5): 800 mg via ORAL
  Filled 2021-12-14 (×5): qty 2

## 2021-12-14 MED ORDER — PANTOPRAZOLE SODIUM 40 MG PO TBEC
40.0000 mg | DELAYED_RELEASE_TABLET | Freq: Every day | ORAL | Status: DC
  Administered 2021-12-14 – 2021-12-15 (×2): 40 mg via ORAL
  Filled 2021-12-14 (×2): qty 1

## 2021-12-14 MED ORDER — RISAQUAD PO CAPS
1.0000 | ORAL_CAPSULE | Freq: Every day | ORAL | Status: DC
  Administered 2021-12-14 – 2021-12-15 (×2): 1 via ORAL
  Filled 2021-12-14 (×3): qty 1

## 2021-12-14 MED ORDER — MOMETASONE FURO-FORMOTEROL FUM 200-5 MCG/ACT IN AERO
2.0000 | INHALATION_SPRAY | Freq: Two times a day (BID) | RESPIRATORY_TRACT | Status: DC
  Filled 2021-12-14: qty 8.8

## 2021-12-14 MED ORDER — NIACIN ER (ANTIHYPERLIPIDEMIC) 500 MG PO TBCR
1000.0000 mg | EXTENDED_RELEASE_TABLET | Freq: Two times a day (BID) | ORAL | Status: DC
  Administered 2021-12-15: 1000 mg via ORAL
  Filled 2021-12-14 (×3): qty 2

## 2021-12-14 MED ORDER — EZETIMIBE 10 MG PO TABS
10.0000 mg | ORAL_TABLET | Freq: Every day | ORAL | Status: DC
  Administered 2021-12-15: 10 mg via ORAL
  Filled 2021-12-14 (×3): qty 1

## 2021-12-14 MED ORDER — ACETAMINOPHEN 650 MG RE SUPP: 650.0000 mg | Freq: Four times a day (QID) | RECTAL | Status: DC | PRN

## 2021-12-14 MED ORDER — LINEZOLID 600 MG/300ML IV SOLN
600.0000 mg | Freq: Two times a day (BID) | INTRAVENOUS | Status: DC
  Administered 2021-12-14 – 2021-12-15 (×2): 600 mg via INTRAVENOUS
  Filled 2021-12-14 (×4): qty 300

## 2021-12-14 MED ORDER — LACTATED RINGERS IV BOLUS
500.0000 mL | Freq: Once | INTRAVENOUS | Status: AC
  Administered 2021-12-14: 500 mL via INTRAVENOUS

## 2021-12-14 NOTE — ED Provider Triage Note (Signed)
Emergency Medicine Provider Triage Evaluation Note  Tim [name] , a 81 y.o. male  was evaluated in triage.  Pt complains of concerns of cellulitis, right leg swelling redness with some drainage, no chest pain shortness of breath fevers or chills..  Review of Systems  Positive: Edema, erythema Negative: Chest pain shortness of breath  Physical Exam  BP (!) 106/46   Pulse 72   Temp 98.5 F (36.9 C) (Oral)   Resp 18   Ht 6' 4"  (1.93 m)   Wt (!) 152 kg   SpO2 99%   BMI 40.79 kg/m  Gen:   Awake, no distress   Resp:  Normal effort  MSK:   Moves extremities without difficulty  Other:    Medical Decision Making  Medically screening exam initiated at 3:59 AM.  Appropriate orders placed.  Haynes Hoehn was informed that the remainder of the evaluation will be completed by another provider, this initial triage assessment does not replace that evaluation, and the importance of remaining in the ED until their evaluation is complete.  Presents with concerns of cellulitis to the right leg lab work has been ordered will need further work-up.   Marcello Fennel, PA-C 12/14/21 0401

## 2021-12-14 NOTE — Consult Note (Signed)
WOC Nurse Consult Note: Patient receiving care in [facility] ED 15. Consult completed remotely. Reason for Consult: "suspect cellulitis of lower extremities" Wound type: weeping injuries to RLE Pressure Injury POA: Yes/No/NA Measurement: Wound bed: see photos Drainage (amount, consistency, odor)  weeping exudate Periwound: RLE with erythema, BLE with edema Dressing procedure/placement/frequency: Wash bilateral lower legs with soap and water. Pat dry. Apply Sween Moisturizing Ointment to intact skin. Place Xeroform gauzes Kellie Simmering [phone]) over open areas and areas that are weeping. Beginning behind the toes and going to just below the knees, spiral wrap kerlix, then 4 inch ace wrap. Perform daily.   I do not see an ABI on record.  Monitor the wound area(s) for worsening of condition such as: Signs/symptoms of infection,  Increase in size,  Development of or worsening of odor, Development of pain, or increased pain at the affected locations.  Notify the medical team if any of these develop. [facility] nurse will not follow at this time.  Please re-consult the [facility] team if needed.  Val Riles, RN, MSN, CWOCN, CNS-BC, pager [phone]

## 2021-12-14 NOTE — ED Provider Notes (Signed)
[facility] EMERGENCY DEPARTMENT Provider Note   CSN: [id number] Arrival date & time: 12/14/21  0334     History  Chief Complaint  Patient presents with   Leg Swelling    Tim [name] is a 81 y.o. 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 Patient's wife is at bedside and assists with providing history. He reports 2-3 days ago a red mark appeared on the anterior shin and this progressed now with several sores and open wounds with surrounding erythema. Patient reports he has been diagnosed with cellulitis in the past and that it presented similarly, was treated with antibiotics. He denies fevers, nausea/vomiting, dizziness, lightheadedness, fatigue, CP, SOB, orthopnea. He has had LE swelling and ulcers on his legs in the past. He follows with Dr. 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 He does not follow with wound care. Last hemoglobin A1c 6.1%, not on diabetes medication at this time.      Home Medications Prior to Admission medications   Medication Sig Start Date End Date Taking? Authorizing Provider  albuterol (PROVENTIL HFA;VENTOLIN HFA) 108 (90 BASE) MCG/ACT inhaler Inhale 2 puffs into the lungs 4 (four) times daily as needed for wheezing or shortness of breath.   Yes [provider]  allopurinol (ZYLOPRIM) 300 MG tablet TAKE 1 TABLET BY MOUTH EVERY DAY WITH FOOD Patient taking differently: Take 300 mg by mouth daily. With food 11/30/21  Yes McGowen, Adrian Blackwater, MD  amoxicillin (AMOXIL) 500 MG tablet Take 2 tablets (1,000 mg total) by mouth 2 (two) times daily. 11/16/21 11/16/22 Yes Truman Hayward, MD  apixaban (ELIQUIS) 5 MG TABS tablet Take 5 mg by mouth 2 (two) times daily.    Yes [provider]  ASPIRIN LOW DOSE 81 MG EC tablet Take 81 mg by mouth daily. 02/03/19  Yes [provider]  B Complex-C (B-COMPLEX WITH VITAMIN C) tablet Take 1 tablet by mouth daily. Take 1 tablet daily, 219m   Yes [provider]  ezetimibe (ZETIA) 10 MG tablet Take 1 tablet (10 mg total) by mouth daily. 12/16/19  Yes McGowen, PAdrian Blackwater MD  ferrous sulfate 324 MG TBEC Take 324 mg by mouth daily. With supper   Yes [provider]  finasteride (PROSCAR) 5 MG tablet TAKE 1 TABLET BY MOUTH EVERY DAY Patient taking differently: Take 5 mg by mouth daily. 10/13/21  Yes McGowen, PAdrian Blackwater MD  gabapentin (NEURONTIN) 800 MG tablet Take 1 tablet (800 mg total) by mouth 4 (four) times daily. 03/25/21  Yes McGowen, PAdrian Blackwater MD  KLOR-CON M20 20 MEQ tablet TAKE 1 TABLET BY MOUTH EVERY DAY Patient taking differently: Take 10 mEq by mouth 2 (two) times daily. 09/15/21  Yes McGowen, PAdrian Blackwater MD  metoprolol succinate (TOPROL-XL) 25 MG 24 hr tablet Take 25 mg by mouth daily. 12/17/19  Yes [provider]  Multiple Vitamin (MULTIVITAMIN ADULT PO) Take 1 tablet by mouth daily.   Yes [provider]  niacin (NIASPAN) 1000 MG CR tablet TAKE 1 TABLET BY MOUTH TWICE A DAY Patient taking differently: Take 1,000 mg by mouth in the morning and at bedtime. 10/28/21  Yes McGowen, PAdrian Blackwater MD  nitroGLYCERIN (NITROSTAT) 0.4 MG SL tablet Place 0.4 mg under the tongue  every 5 (five) minutes as needed for chest pain. 09/04/17  Yes [provider]  pantoprazole (PROTONIX) 40 MG tablet TAKE 1 TABLET BY MOUTH EVERY DAY Patient taking differently: Take 40 mg by mouth daily. 11/30/21  Yes McGowen, Adrian Blackwater, MD  Probiotic Product (PROBIOTIC DAILY PO) Take 1 capsule by mouth daily.   Yes [provider]  rosuvastatin (CRESTOR) 40 MG tablet take 1 tablet by mouth once daily Patient taking differently: Take 40 mg by mouth daily. 02/05/17  Yes Lelon Perla, MD   sacubitril-valsartan (ENTRESTO) 24-26 MG Take 1 tablet by mouth 2 (two) times daily.  07/19/17  Yes [provider]  vitamin C (ASCORBIC ACID) 250 MG tablet Take 250 mg by mouth daily.   Yes [provider]  VITAMIN D, CHOLECALCIFEROL, PO Take 1 tablet by mouth daily.    Yes [provider]  WIXELA INHUB 250-50 MCG/ACT AEPB TAKE 1 PUFF BY MOUTH TWICE A DAY Patient taking differently: Inhale 1 puff into the lungs in the morning and at bedtime. 10/31/21  Yes McGowen, Adrian Blackwater, MD      Allergies    Brimonidine tartrate, Brinzolamide, Latanoprost, Nucynta [tapentadol], Sulfa antibiotics, Timolol maleate, Diltiazem, Rofecoxib, Vancomycin, Codeine, Tamsulosin, Celecoxib, Colchicine, and Tape    Review of Systems   Review of Systems  Constitutional:  Negative for chills, diaphoresis, fatigue and fever.  HENT:  Positive for rhinorrhea. Negative for congestion and sore throat.   Respiratory:  Negative for cough and shortness of breath.   Cardiovascular:  Positive for leg swelling. Negative for chest pain.  Gastrointestinal:  Negative for nausea and vomiting.  Skin:  Positive for color change and wound.  Neurological:  Positive for weakness (Generalized). Negative for dizziness and light-headedness.    Physical Exam Updated Vital Signs BP (!) 100/44   Pulse 63   Temp 98.5 F (36.9 C) (Oral)   Resp 18   Ht 6' 4"  (1.93 m)   Wt (!) 152 kg   SpO2 100%   BMI 40.79 kg/m  Physical Exam Constitutional:      General: He is not in acute distress.    Appearance: He is obese. He is not toxic-appearing.  HENT:     Head: Normocephalic and atraumatic.  Eyes:     General: No scleral icterus.    Conjunctiva/sclera: Conjunctivae normal.  Cardiovascular:     Rate and Rhythm: Normal rate and regular rhythm.     Heart sounds: Murmur heard.  Pulmonary:     Effort: Pulmonary effort is normal.     Breath sounds: Normal breath sounds.  Abdominal:     General: Abdomen is flat.  There is no distension.  Musculoskeletal:     Right lower leg: Edema (1+) present.     Left lower leg: Edema (1+) present.  Skin:    General: Skin is warm and dry.     Findings: Erythema, lesion and wound present.          Comments: See image in media tab  Neurological:     Mental Status: He is alert and oriented to person, place, and time.  Psychiatric:        Mood and Affect: Mood normal.        Behavior: Behavior normal.     ED Results / Procedures / Treatments   Labs (all labs ordered are listed, but only abnormal results are displayed) Labs Reviewed  CBC WITH DIFFERENTIAL/PLATELET - Abnormal; Notable for the following components:  Result Value   RBC 3.88 (*)    Hemoglobin 11.7 (*)    HCT 35.5 (*)    Platelets 132 (*)    All other components within normal limits  COMPREHENSIVE METABOLIC PANEL - Abnormal; Notable for the following components:   Glucose, Bld 143 (*)    Total Protein 6.1 (*)    Albumin 3.2 (*)    AST 58 (*)    All other components within normal limits  CULTURE, BLOOD (ROUTINE X 2)  CULTURE, BLOOD (ROUTINE X 2)  URINE CULTURE  LACTIC ACID, PLASMA  BRAIN NATRIURETIC PEPTIDE  LACTIC ACID, PLASMA  URINALYSIS, ROUTINE W REFLEX MICROSCOPIC    EKG None  Radiology No results found.  Medications Ordered in ED Medications  lactated ringers bolus 500 mL (has no administration in time range)  linezolid (ZYVOX) IVPB 600 mg (has no administration in time range)    ED Course/ Medical Decision Making/ A&P                           Medical Decision Making Amount and/or Complexity of Data Reviewed Labs: ordered.   Tim [name] is a 81 y.o. male with PMHx of chronic endocarditis and ICD infection requiring suppressive therapy and chronic venous insufficiency who presents for 2-3 day history of RLE skin breakdown, sores, erythema on the anterior shin. On arrival patient hypotensive to 91/43 otherwise afebrile and HDS. Given rapid progression of  skin changes unilaterally on RLE>>LLE, there is concern that this is cellulitis and therefore will treat with antibiotics. Patient has hx of intermittent soft pressures on chart review though 91 systolic seems lower than his baseline. NO recent BP changes. He denied palpitations, dizziness at that time though endorses generalized weakness. No leukocytosis on CBC, LA normal, lower concern for sepsis at this time though suspect he is high risk for developing sepsis if active infection present. Will obtain blood cultures, urine culture, order half liter fluid bolus and continue with empiric antibiotics. CMP fairly unremarkable. BP did improve to 124/52 prior to fluid administration. Given patient follows with Dr. Tommy Medal for endocarditis requiring suppressive therapy, has allergy to vancomycin, has tested positive for MRSA in the past, consulted ID for approval to start Linezolid for broad spectrum coverage vs discuss other antibiotic options. ID gave approval to start Linezolid, they will come see patient. Hospital medicine consulted for admission given concern for developing sepsis, discussed case with hospitalist, they will admit.     Final Clinical Impression(s) / ED Diagnoses Final diagnoses:  Cellulitis of right lower extremity    Rx / DC Orders ED Discharge Orders     None         Wayland Denis, MD 12/14/21 1011    Gareth Morgan, MD 12/15/21 0006

## 2021-12-14 NOTE — Consult Note (Signed)
[facility] for Infectious Disease    Date of Admission:  12/14/2021      Total days of antibiotics 1  Linezolid          Reason for Consult: Cellulitis RLE     Referring Provider: Billy Fischer Primary Care Provider: Tammi Sou, MD    Assessment: DELPHIN FUNES is a 81 y.o. 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 On exam he has full thickness skin loss scattered over anterior and medial leg with serous fluid draining from a few pinhole sites. Some scattered areas of bleeding noted. The leg is non-tender, no warmth. Underlying discoloration/redness most significant to most proximal wound up towards shin/knee. He is non-toxic, has no signs of systemic infection. In speaking with [name] and his wife, it appears this may reflect more mechanical damage to skin from dressings that were in place for a prolonged period of time. He doesn't really have local signs of infection aside from swelling and some discoloration; seems less likely contributing. OK to continue linezolid for now (multiple drug allergies and has tolerated this well in the past) pending overnight evaluation and return of further testing. May consider 1 dose of oritavancin if he has improvement in redness on abx over night and resume chronic amoxicl 1 g BID.   Supportive care with leg elevation above heart recommended to help decongest. BP soft (not new) so not sure he would tolerate any diuretics to help with decongest it.   I feel he may benefit from medicated wraps to the affected leg - would consult [facility] team for recommendations on dressing care or if [facility] application may be of utility to heal and decongest the leg.  Chronic amoxil suppression for e faecalis PVE - linezolid will cover this as well.      Plan: Continue linezolid for now pending over night observation (seems more  mechanical skin damage than infection) Would consult [facility] team for recs regarding unna boots or other dressing applications.  Elevate / rest leg     Principal Problem:   Wound of right lower extremity Active Problems:   Enterococcal bacteremia history    S/P TAVR (transcatheter aortic valve replacement)   Cellulitis    [START ON 12/15/2021] allopurinol  300 mg Oral Daily   apixaban  5 mg Oral BID   [START ON 12/15/2021] ezetimibe  10 mg Oral Daily   [START ON 12/15/2021] finasteride  5 mg Oral Daily   gabapentin  800 mg Oral QID   mometasone-formoterol  2 puff Inhalation BID   niacin  1,000 mg Oral BID   pantoprazole  40 mg Oral Daily   Probiotic Daily   Oral Daily   rosuvastatin  40 mg Oral Daily   sodium chloride flush  3 mL Intravenous Q12H    HPI: RONDELL PARDON is a 81 y.o. male who is pending observation admission for right lower leg wounds with possible cellulitis.   He came to the ER overnight with concerns for cellulitis involving his right lower anterior leg. He was hypotensive with SBP in low 90s, otherwise hemodynamically stable. No leukocytosis, normal lactate. His wife and he state that they noticed swelling to the leg a few days ago that had some open areas. They put a few non-adherent pads under compression stockings and left these in place for 3 days. His wife states  that when she went to take them off yesterday they noticed a little bit of yellow drainage on the dressings and pulled off several areas of skin along with the pads. They became nervous that he had infection in his leg and came for evaluation. States that he has had cellulitis in the past but does not think this feels exactly like it did back then. No fevers, chills, malaise, warmth, redness or pain in the leg. He has been faithfully taking his suppressive antibiotics without any problems.   LOV with Dr. 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 Has been on chronic life-long suppression  with amoxicillin for this since June 2021. Has had various DFU's with osteomyelitis and has been following with Dr. Sharol Given for surgical care of this as well. He was doing well on Amox 1gm BID for suppression at that time.  Review of Systems: Review of Systems  Constitutional:  Negative for chills, diaphoresis, fever and malaise/fatigue.  Respiratory:  Negative for cough.   Cardiovascular:  Positive for leg swelling. Negative for chest pain.  Gastrointestinal:  Negative for abdominal pain, diarrhea, nausea and vomiting.  Genitourinary:  Negative for dysuria.  Musculoskeletal:  Negative for myalgias.  Skin:  Positive for rash. Negative for itching.  Neurological:  Negative for dizziness and headaches.    Past Medical History:  Diagnosis Date   AICD (automatic cardioverter/defibrillator) present    Balanitis    +severe phimosis+ buried penis->circ not possible but dorsal slit done 10/2019   BPH (benign prostatic hypertrophy)    with urinary retention.  Renal u/s 12/04/19 NORMAL KIDNEYS, NORMAL BLADDER, NO HYDRONEPHROSIS   CAD (coronary artery disease)    Nonobstructive by 12/2012 cath; then 03/2016 he required BMS to RCA ([facility]).  In-stent restenosis on cath 11/01/16, baloon angioplasty successful.   CATARACT, HX OF    Chronic combined systolic and diastolic CHF (congestive heart failure) ([place]) 05/2016   Ischemic CM.  04/2018 EF 40-45%, grd I DD.  07/2021 EF 55-60%, AV well seated   Chronic pain syndrome    Lumbar DDD; chronic neuropathic pain (DM); has spinal stimulator and sees pain mgmt MD   Complicated UTI (urinary tract infection) 09/2019   Phimoses, acute urinary retention, entoerococcus UTI, enterococc bacteremia.  F/u blood clx's neg x 5d.     COPD    Debilitated patient    Diabetes mellitus type 2 with complications ([place])    HFW2O jumped from 5.7% to 6.1% 03/2015---started metformin at that time.  DM 2 dx by fasting gluc criteria 2018.  Has chronic neuropath pain   Enterococcal  bacteremia 09/2019   Phimoses, acute urinary retention, entoerococcus UTI, enterococc bacteremia.  F/u blood clx's neg x 5d.     Essential hypertension, benign    Fatty liver 2007   2007 u/s showed fatty liver with hepatosplenomegaly.  2019 repeat u/s->fatty liver but no cirrhosis or hepatosplenomegaly.   Generalized weakness    GERD (gastroesophageal reflux disease)    + hx of esoph stenosis, +dilation   Glaucoma    GOUT    HH (hiatus hernia)    HYPERCHOLESTEROLEMIA-PURE    Hypogonadism male    ICD (implantable cardioverter-defibrillator) infection ([place]) 12/22/2019   Infective endocarditis of aortic valve 12/2019   TAVR + RV pacer lead with vegetations->gram + cocci in chains, ?enterococcus (ID->Dr. Tommy Medal)   Lumbosacral neuritis    Lumbosacral spondylosis    Lumbar spinal stenosis with neurogenic claudication--contributes to his chronic pain syndrome  Morbid obesity ([place])    Normal memory function 08/2014   Neuropsychological testing ([facility]): no cognitive impairment or sign of neurodegenerative disorder.  Likely has adjustment d/o with mixed anxiety/depressed features and may benefit from low dose SNRI.     Normocytic anemia 03/2016   Mild-pt needs ferritin and vit B12 level checked (as of 03/22/16). Hb stable 09/2019   NSTEMI (non-ST elevated myocardial infarction) ([place]) 03/20/2016   BMS to RCA   Obesity hypoventilation syndrome (HCC)    Orthostatic hypotension    OSA on CPAP    8 cm H2O   OSTEOARTHRITIS    Paroxysmal atrial fibrillation ([place]) 2003    (? chronic?) Off anticoag for a while due to falls.  Then apixaban started 12/2014.   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 Ella Bodo): neuropathic pain->diff to treat, failed nucynta, failed spinal stimul trial, oxycontin hs + tramadol + gabap as of 12/2017 f/u Dr. Letta Pate.   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   Sick sinus syndrome (HCC)    PPM placed   Thrombocytopenia ([place]) 2018   HSM on 2007 abd u/s---suspect some mild splenic sequestration chronically.   Unspecified glaucoma(365.9)    Unspecified hereditary and idiopathic peripheral neuropathy approx age 57   bilat LE's, ? left arm, too.  Feet became progressively numb + left foot pain intermittently.  Pt may be trying a spinal stimulator (as of 05/2015)   Vaccine counseling 11/16/2021   VENOUS INSUFFICIENCY    Being followed by Dr. Sharol Given as of 10/2016 for two R LL venous stasis ulcers/skin tears.  Healed as of 10/30/16 f/u with Dr. Sharol Given.   VENTRAL HERNIA     Social History   Tobacco Use   Smoking status: Never   Smokeless tobacco: Never  Vaping Use   Vaping Use: Never used  Substance Use Topics   Alcohol use: No    Alcohol/week: 0.0 standard drinks of alcohol   Drug use: No    Types: Oxycodone    Family History  Problem Relation Age of Onset   Hypertension Mother    Coronary artery disease Mother    Heart attack Mother    Neuropathy Mother    Pulmonary fibrosis Father        asbestosis   Allergies  Allergen Reactions   Brimonidine Tartrate Shortness Of Breath    Alphagan-Shortness of breath   Brinzolamide Shortness Of Breath    AZOPT- Shortness of breath   Latanoprost Shortness Of Breath    XALATAN-  Shortness of breath   Nucynta [Tapentadol] Shortness Of Breath   Sulfa Antibiotics Palpitations   Timolol Maleate Shortness Of Breath and Other (See Comments)    TIMOPTIC- Aggravated asthma   Diltiazem Swelling     leg swelling  Rofecoxib Swelling     VIOXX- leg swelling   Vancomycin Rash and Other (See Comments)    Blisters   Codeine Other (See Comments)    Childhood reaction   Tamsulosin Other (See Comments)    Dizziness    Celecoxib Other (See Comments)    CELLBREX-confusion   Colchicine Diarrhea    diarrhea   Tape Rash    OBJECTIVE: Blood pressure (!) 100/44, pulse 63, temperature 98.5 F (36.9 C), temperature source Oral, resp. rate 18, height 6' 4"  (1.93 m), weight (!) 152 kg, SpO2 100 %.   Physical Exam Constitutional:      General: He is not in acute distress.    Appearance: Normal appearance. He is not ill-appearing.  Cardiovascular:     Rate and Rhythm: Normal rate.  Pulmonary:     Effort: Pulmonary effort is normal.  Musculoskeletal:        General: Swelling present. No tenderness.  Skin:    General: Skin is warm and dry.  Neurological:     Mental Status: He is alert.    Legs examined - RLE does show several areas of full thickness skin loss scattered over anterior and medial leg. Non-tender. There are a few scattered areas of bleeding and some serous appearing fluid leaking. There is underlying redness/discoloration that is not warm or painful.      Lab Results Lab Results  Component Value Date   WBC 6.2 12/14/2021   HGB 11.7 (L) 12/14/2021   HCT 35.5 (L) 12/14/2021   MCV 91.5 12/14/2021   PLT 132 (L) 12/14/2021    Lab Results  Component Value Date   CREATININE 0.89 12/14/2021   BUN 17 12/14/2021   NA 139 12/14/2021   K 4.0 12/14/2021   CL 104 12/14/2021   CO2 24 12/14/2021    Lab Results  Component Value Date   ALT 39 12/14/2021   AST 58 (H) 12/14/2021   ALKPHOS 58 12/14/2021   BILITOT 0.8 12/14/2021     Microbiology: No results  found for this or any previous visit (from the past 240 hour(s)).   Janene Madeira, MSN, NP-C [facility] for Infectious Disease [email or web address] Pager: [phone] Office: [phone] [facility] Main Line: [place] Communication Welcome

## 2021-12-14 NOTE — H&P (Signed)
History and Physical    Patient: JAIVIAN [name] [id number] DOB: 11-02-1940 DOA: 12/14/2021 DOS: the patient was seen and examined on 12/14/2021 PCP: Tammi Sou, MD  Patient coming from: Home  Chief Complaint:  Chief Complaint  Patient presents with   Leg Swelling   HPI: Tim [name] is a 81 y.o. 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  He wears compression stockings at baseline and had not noticed 3 days ago that he had some scabs and mild redness present.  They placed some nonstick gauze over those areas.  When they took off the bandages last night noted yellow drainage, wounds now present, and bright red appearance of the surrounding skin.  He has not had any significant fever, chills, increased warmth of the leg, nausea, vomiting, diarrhea, or dysuria complaints.  He had last followed up with Dr. Drucilla Schmidt on 5/17 and was continued on current medication regimen.  Patient does report having pain related to his back.  He states that he has been losing weight related to loss of taste associated with antibiotics given during his hospitalization in January of this year.  Upon admission into the emergency department patient was noted to be afebrile with blood pressures noted to be as low as 91/43, and all other vital signs maintained.  Labs noted WBC 6.2, hemoglobin 11.7, platelets 132, AST 58, BNP, and lactic acid 0.9 which all appear to be stable.  Blood cultures have been ordered.  Patient was given 500 mL bolus of IV fluids and started on Zyvox after discussions with ID.  Review of Systems: As mentioned in the history of  present illness. All other systems reviewed and are negative. Past Medical History:  Diagnosis Date   AICD (automatic cardioverter/defibrillator) present    Balanitis    +severe phimosis+ buried penis->circ not possible but dorsal slit done 10/2019   BPH (benign prostatic hypertrophy)    with urinary retention.  Renal u/s 12/04/19 NORMAL KIDNEYS, NORMAL BLADDER, NO HYDRONEPHROSIS   CAD (coronary artery disease)    Nonobstructive by 12/2012 cath; then 03/2016 he required BMS to RCA ([facility]).  In-stent restenosis on cath 11/01/16, baloon angioplasty successful.   CATARACT, HX OF    Chronic combined systolic and diastolic CHF (congestive heart failure) ([place]) 05/2016   Ischemic CM.  04/2018 EF 40-45%, grd I DD.  07/2021 EF 55-60%, AV well seated   Chronic pain syndrome    Lumbar DDD; chronic neuropathic pain (DM); has spinal stimulator and sees pain mgmt MD   Complicated UTI (urinary tract infection) 09/2019   Phimoses, acute urinary retention, entoerococcus UTI, enterococc bacteremia.  F/u blood clx's neg x 5d.     COPD    Debilitated patient    Diabetes mellitus type 2 with complications ([place])    BHA1P jumped from 5.7% to 6.1% 03/2015---started metformin at that time.  DM 2 dx by fasting gluc criteria 2018.  Has chronic neuropath pain   Enterococcal bacteremia 09/2019   Phimoses, acute urinary retention, entoerococcus UTI, enterococc bacteremia.  F/u blood clx's neg x 5d.     Essential hypertension, benign    Fatty liver 2007   2007 u/s showed fatty liver with hepatosplenomegaly.  2019 repeat u/s->fatty liver but no cirrhosis or hepatosplenomegaly.   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 Tommy Medal)   Lumbosacral neuritis    Lumbosacral spondylosis    Lumbar spinal stenosis with neurogenic claudication--contributes to his chronic pain syndrome   Morbid obesity ([place])    Normal memory function 08/2014   Neuropsychological testing ([facility]): no cognitive impairment or sign of neurodegenerative disorder.  Likely has adjustment d/o with mixed anxiety/depressed features and may benefit from low dose SNRI.     Normocytic anemia 03/2016   Mild-pt needs ferritin and vit B12 level checked (as of 03/22/16). Hb stable 09/2019   NSTEMI (non-ST elevated myocardial infarction) ([place]) 03/20/2016   BMS to RCA   Obesity hypoventilation syndrome (HCC)    Orthostatic hypotension    OSA on CPAP    8 cm H2O   OSTEOARTHRITIS    Paroxysmal atrial fibrillation ([place]) 2003    (? chronic?) Off anticoag for a while due to falls.  Then apixaban started 12/2014.   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 Ella Bodo): neuropathic pain->diff to treat, failed nucynta, failed spinal stimul trial, oxycontin hs + tramadol + gabap as of 12/2017 f/u Dr. Letta Pate.   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   Sick sinus syndrome  (HCC)    PPM placed   Thrombocytopenia ([place]) 2018   HSM on 2007 abd u/s---suspect some mild splenic sequestration chronically.   Unspecified glaucoma(365.9)    Unspecified hereditary and idiopathic peripheral neuropathy approx age 55   bilat LE's, ? left arm, too.  Feet became progressively numb + left foot pain intermittently.  Pt may be trying a spinal stimulator (as of 05/2015)   Vaccine counseling 11/16/2021   VENOUS INSUFFICIENCY    Being followed by Dr. Sharol Given as of 10/2016 for two R LL venous stasis ulcers/skin tears.  Healed as of 10/30/16 f/u with Dr. Sharol Given.   VENTRAL HERNIA    Past Surgical History:  Procedure Laterality Date   AMPUTATION Left 04/11/2013   Procedure: AMPUTATION DIGIT Left 3rd toe;  Surgeon: [name], MD;  Location: [place];  Service: Orthopedics;  Laterality: Left;  Left 3rd toe amputation at [facility]   AMPUTATION Left 08/29/2019   Procedure: LEFT TRANSMETATARSAL AMPUTATION;  Surgeon: [name], MD;  Location: [place];  Service: Orthopedics;  Laterality: Left;   BIOPSY  05/04/2020  Procedure: BIOPSY;  Surgeon: Jackquline Denmark, MD;  Location: [name] ENDOSCOPY;  Service: Endoscopy;;   [place]; 03/10/16   1997 Non-obstructive disease.  03/2016 BMS to RCA, with 25% pDiag dz, o/w normal cors per cath 03/07/16.  Cath 11/01/16: in stent restenosis, successful baloon angioplasty. 08/2020 branch vessel dz, cont medical therapy rec'd.   CARDIAC CATHETERIZATION  12/24/2012   mild < 20% LCx, prox 30% RCA; LVEF 55-65% , moderate pulmonary HTN, moderate AS   CARDIAC DEFIBRILLATOR PLACEMENT  11/07/2017   [place] MRI Quad CRT defibrillator   CARDIOVASCULAR STRESS TEST  05/11/16 ([facility])   2017 Myocardial perfusion imaging:  No ischemia; scar in apex, global hypokinesis, EF 36%.  06/15/20->mod primarily fixed inferolat wall defect mildly worse with stress c/w infarct/scar with mild peri-infarct ischemia, normal EF-->for cath per cards.   Carotid dopplers  03/09/2016   [facility]:  no hemodynamically significant stenosis on either side.   CHOLECYSTECTOMY     COLONOSCOPY [name] 10/30/2012   Procedure: COLONOSCOPY;  Surgeon: [name], MD;  Location: [facility] ENDOSCOPY;  Service: Endoscopy;  Laterality: [name];   CORONARY ANGIOPLASTY WITH STENT PLACEMENT  03/2016; 04/2017   2017-[facility]: BMS to RCA-pt was placed on Brilinta.  04/2017: DES to RCA.   DORSAL SLIT [name] 10/29/2019   for severe phimosis. Procedure: DORSAL SLIT;  Surgeon: Alexis Frock, MD;  Location: [facility] ORS;  Service: Urology;  Laterality: [name];  [place]  05/04/2020   Procedure: ESOPHAGEAL DILATION;  Surgeon: Jackquline Denmark, MD;  Location: [facility] ENDOSCOPY;  Service: Endoscopy;;   ESOPHAGOGASTRODUODENOSCOPY (EGD) WITH PROPOFOL [name] 05/04/2020   Procedure: ESOPHAGOGASTRODUODENOSCOPY (EGD) WITH PROPOFOL;  Surgeon: Jackquline Denmark, MD;  Location: [facility] ENDOSCOPY;  Service: Endoscopy;  Laterality: [name];   EYE SURGERY Bilateral cataract   HEMORRHOID SURGERY     INTRAOCULAR LENS INSERTION Bilateral    KNEE SURGERY Right    LEFT AND RIGHT HEART CATHETERIZATION WITH CORONARY ANGIOGRAM [name] 12/24/2012   Procedure: LEFT AND RIGHT HEART CATHETERIZATION WITH CORONARY ANGIOGRAM;  Surgeon: [name], MD;  Location: [facility] CATH LAB;  Service: Cardiovascular;  Laterality: [name];   LEG SURGERY Bilateral    lenghtening    PACEMAKER PLACEMENT  04/13/2016   2nd deg HB after TAVR, pt had DC MDT PPM placed.   SHOULDER ARTHROSCOPY  08/30/2011   Procedure: ARTHROSCOPY SHOULDER;  Surgeon: [name], MD;  Location: [place];  Service: Orthopedics;  Laterality: Right;  Right Shoulder Arthroscopy, Debridement, and Decompression   SPINAL CORD STIMULATOR INSERTION [name] 09/10/2015   Procedure: LUMBAR SPINAL CORD STIMULATOR INSERTION;  Surgeon: [name], MD;  Location: [place] NEURO ORS;  Service: Neurosurgery;  Laterality: [name];   TEE WITHOUT CARDIOVERSION [name] 09/16/2019   Procedure: TRANSESOPHAGEAL ECHOCARDIOGRAM (TEE);  Surgeon: Skeet Latch, MD;  Location: [name] ENDOSCOPY;  Service: Cardiovascular;  Laterality: [name];   TEE WITHOUT CARDIOVERSION [name] 12/02/2019   +vegetation on AVR and pacer lead in RV.  EF 55-60%, normal wall motion.  Valves function normal.  Procedure: TRANSESOPHAGEAL ECHOCARDIOGRAM (TEE);  Surgeon: Geralynn Rile, MD;  Location: [place];  Service: Cardiovascular;  Laterality: [name];   TOE AMPUTATION Left    due to osteomyelitis.  R big toe surg due to osteoarth   TONSILLECTOMY     traeculectomy Left    eye   TRANSCATHETER AORTIC VALVE REPLACEMENT, TRANSFEMORAL  04/11/2016   TRANSESOPHAGEAL ECHOCARDIOGRAM  03/09/2016; 09/2019   [facility]: EF 55-60%, PFO seen with bi-directional shunting, no thrombus in appendage.  09/2019 ->no  valvular vegetations. Small patent foramen ovale with predominantly left to right shunting across the interatrial septum.   TRANSTHORACIC ECHOCARDIOGRAM  01/2015; 01/2016; 05/18/16; 09/18/16, 05/2017, 08/2017   01/2015 No signif change in aortic stenosis (moderate).  01/2016 Severe LVH w/small LV cavity, EF 60-65%, grade I diast dysfxn.  05/2016 (s/p TAVR): EF 50-55%, grd I DD, biopros AV good.  08/2016--EF 50-55%, LV septal motion c/w conduction abnl, grd I DD,mild MS,bioprosth aortic valve well seated, w/trace AR. 05/2017 TTE EF 35%. 08/2017-EF 35%, mod diff hypokin LV, grd I DD, biopros AV good.    TRANSTHORACIC ECHOCARDIOGRAM  04/2018; 09/2019   04/2018: EF 40-45%, mod diffuse LV hypokin, grd I DD, bioprosth AV well seated, no AS or AR. 09/2019 EF 60-65%, grd I DD, valves fine, including bioprosth AV. 04/29/20 (tech diff) EF 55-60%, grd I DD, vegetation on MV.  07/2021 EF 55-60% (s/p upgrade to BiV PPM), AV well seated.   VITRECTOMY     Social History:  reports that he has never smoked. He has never used smokeless tobacco. He reports that he does not drink alcohol and does not use drugs.  Allergies  Allergen Reactions   Brimonidine Tartrate Shortness Of Breath    Alphagan-Shortness of  breath   Brinzolamide Shortness Of Breath    AZOPT- Shortness of breath   Latanoprost Shortness Of Breath    XALATAN- Shortness of breath   Nucynta [Tapentadol] Shortness Of Breath   Sulfa Antibiotics Palpitations   Timolol Maleate Shortness Of Breath and Other (See Comments)    TIMOPTIC- Aggravated asthma   Diltiazem Swelling     leg swelling   Rofecoxib Swelling     VIOXX- leg swelling   Vancomycin Rash and Other (See Comments)    Blisters   Codeine Other (See Comments)    Childhood reaction   Tamsulosin Other (See Comments)    Dizziness    Celecoxib Other (See Comments)    CELLBREX-confusion   Colchicine Diarrhea    diarrhea   Tape Rash    Family History  Problem Relation Age of Onset   Hypertension Mother    Coronary artery disease Mother    Heart attack Mother    Neuropathy Mother    Pulmonary fibrosis Father        asbestosis    Prior to Admission medications   Medication Sig Start Date End Date Taking? Authorizing Provider  albuterol (PROVENTIL HFA;VENTOLIN HFA) 108 (90 BASE) MCG/ACT inhaler Inhale 2 puffs into the lungs 4 (four) times daily as needed for wheezing or shortness of breath.   Yes [provider]  allopurinol (ZYLOPRIM) 300 MG tablet TAKE 1 TABLET BY MOUTH EVERY DAY WITH FOOD Patient taking differently: Take 300 mg by mouth daily. With food 11/30/21  Yes McGowen, Adrian Blackwater, MD  amoxicillin (AMOXIL) 500 MG tablet Take 2 tablets (1,000 mg total) by mouth 2 (two) times daily. 11/16/21 11/16/22 Yes Truman Hayward, MD  apixaban (ELIQUIS) 5 MG TABS tablet Take 5 mg by mouth 2 (two) times daily.   Yes [provider]  ASPIRIN LOW DOSE 81 MG EC tablet Take 81 mg by mouth daily. 02/03/19  Yes [provider]  B Complex-C (B-COMPLEX WITH VITAMIN C) tablet Take 1 tablet by mouth daily. Take 1 tablet daily, 223m   Yes [provider]  ezetimibe (ZETIA) 10 MG tablet Take 1 tablet (10 mg total) by mouth daily. 12/16/19  Yes  McGowen, PAdrian Blackwater MD  ferrous sulfate 324 MG TBEC Take  324 mg by mouth daily. With supper   Yes [provider]  finasteride (PROSCAR) 5 MG tablet TAKE 1 TABLET BY MOUTH EVERY DAY Patient taking differently: Take 5 mg by mouth daily. 10/13/21  Yes McGowen, Adrian Blackwater, MD  gabapentin (NEURONTIN) 800 MG tablet Take 1 tablet (800 mg total) by mouth 4 (four) times daily. 03/25/21  Yes McGowen, Adrian Blackwater, MD  KLOR-CON M20 20 MEQ tablet TAKE 1 TABLET BY MOUTH EVERY DAY Patient taking differently: Take 10 mEq by mouth 2 (two) times daily. 09/15/21  Yes McGowen, Adrian Blackwater, MD  metoprolol succinate (TOPROL-XL) 25 MG 24 hr tablet Take 25 mg by mouth daily. 12/17/19  Yes [provider]  Multiple Vitamin (MULTIVITAMIN ADULT PO) Take 1 tablet by mouth daily.   Yes [provider]  niacin (NIASPAN) 1000 MG CR tablet TAKE 1 TABLET BY MOUTH TWICE A DAY Patient taking differently: Take 1,000 mg by mouth in the morning and at bedtime. 10/28/21  Yes McGowen, Adrian Blackwater, MD  nitroGLYCERIN (NITROSTAT) 0.4 MG SL tablet Place 0.4 mg under the tongue every 5 (five) minutes as needed for chest pain. 09/04/17  Yes [provider]  pantoprazole (PROTONIX) 40 MG tablet TAKE 1 TABLET BY MOUTH EVERY DAY Patient taking differently: Take 40 mg by mouth daily. 11/30/21  Yes McGowen, Adrian Blackwater, MD  Probiotic Product (PROBIOTIC DAILY PO) Take 1 capsule by mouth daily.   Yes [provider]  rosuvastatin (CRESTOR) 40 MG tablet take 1 tablet by mouth once daily Patient taking differently: Take 40 mg by mouth daily. 02/05/17  Yes Lelon Perla, MD  sacubitril-valsartan (ENTRESTO) 24-26 MG Take 1 tablet by mouth 2 (two) times daily.  07/19/17  Yes [provider]  vitamin C (ASCORBIC ACID) 250 MG tablet Take 250 mg by mouth daily.   Yes [provider]  VITAMIN D, CHOLECALCIFEROL, PO Take 1 tablet by mouth daily.    Yes [provider]  WIXELA INHUB 250-50 MCG/ACT AEPB TAKE 1  PUFF BY MOUTH TWICE A DAY Patient taking differently: Inhale 1 puff into the lungs in the morning and at bedtime. 10/31/21  Yes Tammi Sou, MD    Physical Exam: Vitals:   12/14/21 0646 12/14/21 0825 12/14/21 0830 12/14/21 0930  BP: (!) 91/43 (!) 95/47 (!) 124/52 (!) 100/44  Pulse: 66 64 66 63  Resp: 18 18 19 18   Temp:      TempSrc:      SpO2: 99% 100% 100% 100%  Weight:      Height:        Constitutional: Elderly male who appears to be in no acute distress at this time Eyes: PERRL, lids and conjunctivae normal ENMT: Mucous membranes are moist. Posterior pharynx clear of any exudate or lesions.hard of hearing. Neck: normal, supple, no JVD appreciated. Respiratory: clear to auscultation bilaterally, no wheezing, no crackles. Normal respiratory effort. No accessory muscle use.  Cardiovascular: Regular rate and rhythm with murmur present.  At least +1 edema of the bilateral lower extremities. Abdomen: no tenderness, no masses palpated.  Bowel sounds positive.  Musculoskeletal: no clubbing / cyanosis.  Ray amputation of the left foot.  Good ROM, no contractures. Normal muscle tone.  Skin: Patchy open wounds of the right leg with scattered areas of bleeding ,serosanguineous fluid drainage, and surrounding redness appreciated as seen below   Neurologic: CN 2-12 grossly intact. Sensation intact, DTR normal. Strength 5/5 in all 4.  Psychiatric: Normal judgment and insight. Alert and  oriented x 3. Normal mood.   Data Reviewed:    Assessment and Plan: Suspected cellulitis of the right leg Acute patient presented with couple days of redness to the right leg.  There was no leukocytosis and lactic acid was within normal limits.  Blood cultures have been ordered and after discussions with ID patient was started on Zyvox. -Admit to a progressive bed -Follow-up blood cultures -Check CRP -Check ABI with/without TBI -Continue linezolid -Wound care consulted  -Appreciate ID consultative  services, will follow-up for further recommendation  Transient hypotension Acute.  Initial blood pressures noted to be as low as 91/43.  Home blood pressure regimen includes metoprolol 25 mg daily and Entresto 24-26 mg twice daily.  Blood pressures improved some after initial IV fluid bolus. -Goal MAP 65.  Will place on some gentle IV fluids at this time and adjust as needed. -Hold home blood pressure regimen.  Then determine when medically appropriate to resume  Heart failure with preserved EF At this time patient lost 1 edema of the lower extremities, but does not appear acutely fluid overloaded.  BNP was 51.5 last available EF 55- 60% with grade 1 diastolic dysfunction in 84/7841. -Strict I&Os -Daily weights  Paroxysmal atrial fibrillation on chronic anticoagulation Patient appears to be in a regular rate and rhythm at this time. -Continue Eliquis  History of Enterococcus faecalis endocarditis aortic stenosis s/p TAVR Patient on chronic immunosuppression with amoxicillin. -Hold amoxicillin as adequate coverage with linezolid per ID   Elevated AST Acute.  On admission AST 58, but previously had been seen elevated at 43 on 4/13.  Normocytic anemia Hemoglobin 11.7 g/dL which appears around patient's baseline.  No reports of bleeding -Continue to monitor  Thrombocytopenia Chronic.  Platelet count 132 which appears near patient's baseline. -Continue to monitor  Dyslipidemia Home medication regimen includes Zetia 10 mg daily, Niaspan 1000 mg twice daily, and Crestor 40 mg daily. -Continue current regimen    Prediabetes Upon admission glucose was mildly elevated at 143.  Last hemoglobin A1c was 6.1 on 4/13. -Continue to monitor  BPH -Continue Proscar  OSA   -Continue CPAP nightly  Morbid obesity BMI 40.79 kg/m2  DVT prophylaxis:   Eliquis  Advance Care Planning:   Code Status: DNR    Consults: ID  Family Communication: Wife updated at bedside  Severity of  Illness: The appropriate patient status for this patient is INPATIENT. Inpatient status is judged to be reasonable and necessary in order to provide the required intensity of service to ensure the patient's safety. The patient's presenting symptoms, physical exam findings, and initial radiographic and laboratory data in the context of their chronic comorbidities is felt to place them at high risk for further clinical deterioration. Furthermore, it is not anticipated that the patient will be medically stable for discharge from the hospital within 2 midnights of admission.   * I certify that at the point of admission it is my clinical judgment that the patient will require inpatient hospital care spanning beyond 2 midnights from the point of admission due to high intensity of service, high risk for further deterioration and high frequency of surveillance required.*  Author: Norval Morton, MD 12/14/2021 10:03 AM  For on call review [email or web address].

## 2021-12-14 NOTE — ED Triage Notes (Signed)
Pt has noticed increased redness to right lower leg over last three days. Pt states that the leg open places and scabs on it. Has had cellulitis before and this is similar.

## 2021-12-15 ENCOUNTER — Other Ambulatory Visit (HOSPITAL_COMMUNITY): Payer: Self-pay

## 2021-12-15 ENCOUNTER — Encounter (HOSPITAL_COMMUNITY): Payer: Medicare Other

## 2021-12-15 DIAGNOSIS — S81801A Unspecified open wound, right lower leg, initial encounter: Secondary | ICD-10-CM | POA: Diagnosis not present

## 2021-12-15 DIAGNOSIS — Z952 Presence of prosthetic heart valve: Secondary | ICD-10-CM | POA: Diagnosis not present

## 2021-12-15 DIAGNOSIS — Z8679 Personal history of other diseases of the circulatory system: Secondary | ICD-10-CM | POA: Diagnosis not present

## 2021-12-15 DIAGNOSIS — L03115 Cellulitis of right lower limb: Secondary | ICD-10-CM | POA: Diagnosis not present

## 2021-12-15 DIAGNOSIS — N400 Enlarged prostate without lower urinary tract symptoms: Secondary | ICD-10-CM | POA: Diagnosis not present

## 2021-12-15 DIAGNOSIS — I5032 Chronic diastolic (congestive) heart failure: Secondary | ICD-10-CM | POA: Diagnosis not present

## 2021-12-15 LAB — CBC
HCT: 32.3 % — ABNORMAL LOW (ref 39.0–52.0)
Hemoglobin: 10.6 g/dL — ABNORMAL LOW (ref 13.0–17.0)
MCH: 30 pg (ref 26.0–34.0)
MCHC: 32.8 g/dL (ref 30.0–36.0)
MCV: 91.5 fL (ref 80.0–100.0)
Platelets: 110 10*3/uL — ABNORMAL LOW (ref 150–400)
RBC: 3.53 MIL/uL — ABNORMAL LOW (ref 4.22–5.81)
RDW: 14.4 % (ref 11.5–15.5)
WBC: 5.1 10*3/uL (ref 4.0–10.5)
nRBC: 0 % (ref 0.0–0.2)

## 2021-12-15 LAB — MRSA NEXT GEN BY PCR, NASAL: MRSA by PCR Next Gen: NOT DETECTED

## 2021-12-15 LAB — COMPREHENSIVE METABOLIC PANEL
ALT: 35 U/L (ref 0–44)
AST: 53 U/L — ABNORMAL HIGH (ref 15–41)
Albumin: 2.8 g/dL — ABNORMAL LOW (ref 3.5–5.0)
Alkaline Phosphatase: 49 U/L (ref 38–126)
Anion gap: 10 (ref 5–15)
BUN: 12 mg/dL (ref 8–23)
CO2: 23 mmol/L (ref 22–32)
Calcium: 8.7 mg/dL — ABNORMAL LOW (ref 8.9–10.3)
Chloride: 105 mmol/L (ref 98–111)
Creatinine, Ser: 0.76 mg/dL (ref 0.61–1.24)
GFR, Estimated: >60 mL/min (ref >60)
Glucose, Bld: 123 mg/dL — ABNORMAL HIGH (ref 70–99)
Potassium: 3.8 mmol/L (ref 3.5–5.1)
Sodium: 138 mmol/L (ref 135–145)
Total Bilirubin: 0.7 mg/dL (ref 0.3–1.2)
Total Protein: 5.5 g/dL — ABNORMAL LOW (ref 6.5–8.1)

## 2021-12-15 LAB — GLUCOSE, CAPILLARY: Glucose-Capillary: 125 mg/dL — ABNORMAL HIGH (ref 70–99)

## 2021-12-15 LAB — URINE CULTURE: Culture: NO GROWTH

## 2021-12-15 LAB — SEDIMENTATION RATE: Sed Rate: 30 mm/hr — ABNORMAL HIGH (ref 0–16)

## 2021-12-15 MED ORDER — LINEZOLID 600 MG PO TABS
600.0000 mg | ORAL_TABLET | Freq: Two times a day (BID) | ORAL | Status: DC
  Filled 2021-12-15: qty 1

## 2021-12-15 MED ORDER — AMOXICILLIN 500 MG PO TABS
1000.0000 mg | ORAL_TABLET | Freq: Two times a day (BID) | ORAL | 11 refills | Status: DC
  Filled 2021-12-15: qty 120, 30d supply, fill #0

## 2021-12-15 MED ORDER — LINEZOLID 600 MG PO TABS
600.0000 mg | ORAL_TABLET | Freq: Two times a day (BID) | ORAL | Status: AC
  Administered 2021-12-15: 600 mg via ORAL
  Filled 2021-12-15: qty 1

## 2021-12-15 MED ORDER — LINEZOLID 600 MG PO TABS
600.0000 mg | ORAL_TABLET | Freq: Two times a day (BID) | ORAL | 0 refills | Status: DC
  Filled 2021-12-15: qty 12, 6d supply, fill #0

## 2021-12-15 NOTE — TOC Progression Note (Signed)
Transition of Care [facility]) - Progression Note    Patient Details  Name: Tim [name] MRN: [id number] Date of Birth: [birth year]  Transition of Care [name]) CM/SW [place], RN Phone Number:[phone]  12/15/2021, 10:08 AM  Clinical Narrative:     81 y.o. male presents with complaints of redness and swelling of his right leg. Currently there are no TOC needs. TOC will follow for any disposition needs.        Expected Discharge Plan and Services                                                 Social Determinants of Health (SDOH) Interventions    Readmission Risk Interventions    05/07/2020   11:37 AM 11/28/2019   10:22 AM  Readmission Risk Prevention Plan  Transportation Screening Complete Complete  PCP or Specialist Appt within 3-5 Days Complete   HRI or [place] Complete Complete  Social Work Consult for [place] Planning/Counseling Complete Complete  Palliative Care Screening Not Applicable Not Applicable  Medication Review [employer]) Complete Referral to Pharmacy

## 2021-12-15 NOTE — Progress Notes (Signed)
[facility] for Infectious Disease    Date of Admission:  12/14/2021   Total days of antibiotics 2   ID: Tim [name] is a 81 y.o. male with RLE possible cellulitis Principal Problem:   Cellulitis Active Problems:   Paroxysmal atrial fibrillation (HCC)   Enterococcal bacteremia history    S/P TAVR (transcatheter aortic valve replacement)   Wound of right lower extremity   Transient hypotension   Heart failure with preserved ejection fraction (HCC)   Prediabetes   Thrombocytopenia (HCC)   Obesity, Class III, BMI 40-49.9 (morbid obesity) (HCC)   BPH (benign prostatic hyperplasia)    Subjective: Feeling fine. No fever, chills, nightsweats, no pain to RLE(insensate)  Medications:   acidophilus  1 capsule Oral Daily   allopurinol  300 mg Oral Daily   apixaban  5 mg Oral BID   ezetimibe  10 mg Oral Daily   finasteride  5 mg Oral Daily   gabapentin  800 mg Oral QID   linezolid  600 mg Oral Q12H   mometasone-formoterol  2 puff Inhalation BID   niacin  1,000 mg Oral BID   pantoprazole  40 mg Oral Daily   rosuvastatin  40 mg Oral Daily   sodium chloride flush  3 mL Intravenous Q12H    Objective: Vital signs in last 24 hours: Temp:  [98 F (36.7 C)] 98 F (36.7 C) (06/15 0838) Pulse Rate:  [72-95] 86 (06/15 0838) Resp:  [13-33] 16 (06/15 0838) BP: (92-146)/(33-67) 139/67 (06/15 0838) SpO2:  [93 %-100 %] 100 % (06/15 0240)  Physical Exam  Constitutional: He is oriented to person, place, and time. He appears well-developed and well-nourished. No distress.  HENT:  Mouth/Throat: Oropharynx is clear and moist. No oropharyngeal exudate.  Cardiovascular: Normal rate, regular rhythm and normal heart sounds. Exam reveals no gallop and no friction rub.  Ext: +1 edema Neurological: He is alert and oriented to person, place, and time.  Skin: Skin is warm and dry. Less weeping. S/o hyperpigmentation. No open wounds Psychiatric: He has a normal mood and affect. His behavior  is normal.    Lab Results Recent Labs    12/14/21 0406 12/15/21 0343  WBC 6.2 5.1  HGB 11.7* 10.6*  HCT 35.5* 32.3*  NA 139 138  K 4.0 3.8  CL 104 105  CO2 24 23  BUN 17 12  CREATININE 0.89 0.76   Liver Panel Recent Labs    12/14/21 0406 12/15/21 0343  PROT 6.1* 5.5*  ALBUMIN 3.2* 2.8*  AST 58* 53*  ALT 39 35  ALKPHOS 58 49  BILITOT 0.8 0.7   Sedimentation Rate Recent Labs    12/15/21 0343  ESRSEDRATE 30*   C-Reactive Protein No results for input(s): "CRP" in the last 72 hours.  Microbiology: reviewed Studies/Results: No results found.   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  Venous stasis of lower extremity = recommend compression wraps to help minimize weeping  Follow-up == arranging follow up in ID clinic  [facility] for Infectious Diseases Pager: [phone]  12/15/2021, 10:59 AM

## 2021-12-15 NOTE — Discharge Summary (Signed)
Physician Discharge Summary  Tim [name] ZOX:[id number] DOB: [birth year] DOA: 12/14/2021  PCP: Tammi Sou, MD  Admit date: 12/14/2021 Discharge date: 12/15/2021  Admitted From: Home Disposition: Home  Recommendations for Outpatient Follow-up:  Follow up with PCP in 1-2 weeks Please follow-up with infectious disease and wound care as scheduled, continue bandage changes and ointment application as discussed Continue antibiotics as scheduled, once completed transition back to your previous antibiotic regimen  Home Health: None Equipment/Devices: None equipment  Discharge Condition: Stable CODE STATUS: Full Diet recommendation: Low-salt low-fat low-carb diet  Brief/Interim Summary: Tim [name] is a 81 y.o. 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  Patient admitted with questionable cellulitis of the right lower extremity.  Patient indicates noncompliance with home stockings likely with worsening edema over the past 3 to 4 days per report this morning.  Given patient's already drastic improvement on IV linezolid discussed with ID Dr. Graylon Good agreed to transition to p.o. for close outpatient follow-up with topical dressing changes per wound care in house.  PT evaluating and no further PT recommendations or follow-up indicated.  At this time patient is back to baseline no fevers chills nausea vomiting diarrhea constipation headache chest pain shortness of breath.  Right lower extremity erythema appears to be improving drastically and is otherwise stable and agreeable for discharge home.  Close follow-up with PCP infectious  disease and wound care as scheduled.  *Of note patient was previously on chronic antibiotics with amoxicillin, patient need to continue linezolid for 1 week as discussed, once completed that therapy he will need to switch back to amoxicillin.  Please do not take these medications together.  Discharge Diagnoses:  Principal Problem:   Cellulitis Active Problems:   Wound of right lower extremity   Transient hypotension   Heart failure with preserved ejection fraction (HCC)   Paroxysmal atrial fibrillation (HCC)   Enterococcal bacteremia history    S/P TAVR (transcatheter aortic valve replacement)   Prediabetes   Thrombocytopenia (HCC)   Obesity, Class III, BMI 40-49.9 (morbid obesity) (HCC)   BPH (benign prostatic hyperplasia)   Discharge Instructions  Discharge Instructions     Discharge patient   Complete by: As directed    Discharge disposition: 01-Home or Self Care   Discharge patient date: 12/15/2021      Allergies as of 12/15/2021       Reactions   Brimonidine Tartrate Shortness Of Breath   Alphagan-Shortness of breath   Brinzolamide Shortness Of Breath   AZOPT- Shortness of breath   Latanoprost Shortness Of Breath   XALATAN- Shortness of breath   Nucynta [tapentadol] Shortness Of Breath   Sulfa Antibiotics Palpitations   Timolol Maleate Shortness Of Breath, Other (See Comments)   TIMOPTIC- Aggravated asthma   Diltiazem Swelling    leg swelling   Rofecoxib Swelling    VIOXX- leg swelling   Vancomycin Rash, Other (See Comments)   Blisters   Codeine Other (See Comments)   Childhood reaction   Tamsulosin Other (See Comments)   Dizziness    Celecoxib Other (See Comments)   CELLBREX-confusion   Colchicine Diarrhea   diarrhea   Tape Rash        Medication List  TAKE these medications    albuterol 108 (90 Base) MCG/ACT inhaler Commonly known as: VENTOLIN HFA Inhale 2 puffs into the lungs 4 (four) times daily as needed for wheezing or shortness of  breath.   allopurinol 300 MG tablet Commonly known as: ZYLOPRIM TAKE 1 TABLET BY MOUTH EVERY DAY WITH FOOD   amoxicillin 500 MG tablet Commonly known as: AMOXIL Take 2 tablets (1,000 mg total) by mouth 2 (two) times daily. Resume taking 6/21 PM after linezolid course completed What changed: additional instructions   apixaban 5 MG Tabs tablet Commonly known as: ELIQUIS Take 5 mg by mouth 2 (two) times daily.   Aspirin Low Dose 81 MG tablet Generic drug: aspirin EC Take 81 mg by mouth daily.   B-complex with vitamin C tablet Take 1 tablet by mouth daily. Take 1 tablet daily, 275m   ezetimibe 10 MG tablet Commonly known as: ZETIA Take 1 tablet (10 mg total) by mouth daily.   ferrous sulfate 324 MG Tbec Take 324 mg by mouth daily. With supper   finasteride 5 MG tablet Commonly known as: PROSCAR TAKE 1 TABLET BY MOUTH EVERY DAY   gabapentin 800 MG tablet Commonly known as: NEURONTIN Take 1 tablet (800 mg total) by mouth 4 (four) times daily.   Klor-Con M20 20 MEQ tablet Generic drug: potassium chloride SA TAKE 1 TABLET BY MOUTH EVERY DAY What changed:  how much to take when to take this   linezolid 600 MG tablet Commonly known as: ZYVOX Take 1 tablet (600 mg total) by mouth every 12 (twelve) hours for 12 doses.   metoprolol succinate 25 MG 24 hr tablet Commonly known as: TOPROL-XL Take 25 mg by mouth daily.   MULTIVITAMIN ADULT PO Take 1 tablet by mouth daily.   niacin 1000 MG CR tablet Commonly known as: NIASPAN TAKE 1 TABLET BY MOUTH TWICE A DAY What changed: when to take this   nitroGLYCERIN 0.4 MG SL tablet Commonly known as: NITROSTAT Place 0.4 mg under the tongue every 5 (five) minutes as needed for chest pain.   pantoprazole 40 MG tablet Commonly known as: PROTONIX TAKE 1 TABLET BY MOUTH EVERY DAY   PROBIOTIC DAILY PO Take 1 capsule by mouth daily.   rosuvastatin 40 MG tablet Commonly known as: CRESTOR take 1 tablet by mouth once daily    sacubitril-valsartan 24-26 MG Commonly known as: ENTRESTO Take 1 tablet by mouth 2 (two) times daily.   vitamin C 250 MG tablet Commonly known as: ASCORBIC ACID Take 250 mg by mouth daily.   VITAMIN D (CHOLECALCIFEROL) PO Take 1 tablet by mouth daily.   Wixela Inhub 250-50 MCG/ACT Aepb Generic drug: fluticasone-salmeterol TAKE 1 PUFF BY MOUTH TWICE A DAY What changed: See the new instructions.        Allergies  Allergen Reactions   Brimonidine Tartrate Shortness Of Breath    Alphagan-Shortness of breath   Brinzolamide Shortness Of Breath    AZOPT- Shortness of breath   Latanoprost Shortness Of Breath    XALATAN- Shortness of breath   Nucynta [Tapentadol] Shortness Of Breath   Sulfa Antibiotics Palpitations   Timolol Maleate Shortness Of Breath and Other (See Comments)    TIMOPTIC- Aggravated asthma   Diltiazem Swelling     leg swelling   Rofecoxib Swelling     VIOXX- leg swelling   Vancomycin Rash and Other (See Comments)    Blisters   Codeine Other (See Comments)    Childhood reaction   Tamsulosin Other (See  Comments)    Dizziness    Celecoxib Other (See Comments)    CELLBREX-confusion   Colchicine Diarrhea    diarrhea   Tape Rash    Consultations: Infectious disease, wound care, PT  Procedures/Studies: No results found.   Subjective: No acute issues or events overnight   Discharge Exam: Vitals:   12/15/21 1500 12/15/21 1600  BP: 110/61 (!) 108/54  Pulse: 93 90  Resp: 13 14  Temp:    SpO2: 96% 97%   Vitals:   12/15/21 1300 12/15/21 1400 12/15/21 1500 12/15/21 1600  BP: (!) 96/57 108/60 110/61 (!) 108/54  Pulse: 92 94 93 90  Resp: 15 (!) 22 13 14   Temp:      TempSrc:      SpO2: 95% 95% 96% 97%  Weight:      Height:        General: Pt is alert, awake, not in acute distress Cardiovascular: RRR, S1/S2 +, no rubs, no gallops Respiratory: CTA bilaterally, no wheezing, no rhonchi Abdominal: Soft, NT, ND, bowel sounds + Extremities: Right  lower extremity erythema resolving from previously demarcated lines   The results of significant diagnostics from this hospitalization (including imaging, microbiology, ancillary and laboratory) are listed below for reference.     Microbiology: Recent Results (from the past 240 hour(s))  Blood culture (routine x 2)     Status: None (Preliminary result)   Collection Time: 12/14/21 10:21 AM   Specimen: BLOOD RIGHT FOREARM  Result Value Ref Range Status   Specimen Description BLOOD RIGHT FOREARM  Final   Special Requests   Final    BOTTLES DRAWN AEROBIC AND ANAEROBIC Blood Culture adequate volume   Culture   Final    NO GROWTH < 24 HOURS Performed at [facility], [place] [address] [place], [place] [address]    Report Status PENDING  Incomplete  Blood culture (routine x 2)     Status: None (Preliminary result)   Collection Time: 12/14/21 10:36 AM   Specimen: BLOOD  Result Value Ref Range Status   Specimen Description BLOOD RIGHT ANTECUBITAL  Final   Special Requests   Final    BOTTLES DRAWN AEROBIC AND ANAEROBIC Blood Culture results may not be optimal due to an inadequate volume of blood received in culture bottles   Culture   Final    NO GROWTH < 24 HOURS Performed at [facility], [place] [address], [place], [place] [address]    Report Status PENDING  Incomplete  Urine Culture     Status: None   Collection Time: 12/14/21  6:37 PM   Specimen: Urine, Clean Catch  Result Value Ref Range Status   Specimen Description URINE, CLEAN CATCH  Final   Special Requests NONE  Final   Culture   Final    NO GROWTH Performed at [facility], [place] [address] [place],  [address]    Report Status 12/15/2021 FINAL  Final  MRSA Next Gen by PCR, Nasal     Status: None   Collection Time: 12/15/21  8:40 AM   Specimen: Nasal Mucosa; Nasal Swab  Result Value Ref Range Status   MRSA by PCR Next Gen NOT DETECTED NOT DETECTED Final    Comment: (NOTE) The GeneXpert MRSA Assay  (FDA approved for NASAL specimens only), is one component of a comprehensive MRSA colonization surveillance program. It is not intended to diagnose MRSA infection nor to guide or monitor treatment for MRSA infections. Test performance is not  FDA approved in patients less than 40 years old. Performed at [facility], [place] [address] [place], [place] [address]      Labs: BNP (last 3 results) Recent Labs    12/14/21 0406  BNP 39.5   Basic Metabolic Panel: Recent Labs  Lab 12/14/21 0406 12/15/21 0343  NA 139 138  K 4.0 3.8  CL 104 105  CO2 24 23  GLUCOSE 143* 123*  BUN 17 12  CREATININE 0.89 0.76  CALCIUM 9.0 8.7*   Liver Function Tests: Recent Labs  Lab 12/14/21 0406 12/15/21 0343  AST 58* 53*  ALT 39 35  ALKPHOS 58 49  BILITOT 0.8 0.7  PROT 6.1* 5.5*  ALBUMIN 3.2* 2.8*   No results for input(s): "LIPASE", "AMYLASE" in the last 168 hours. No results for input(s): "AMMONIA" in the last 168 hours. CBC: Recent Labs  Lab 12/14/21 0406 12/15/21 0343  WBC 6.2 5.1  NEUTROABS 3.5  --   HGB 11.7* 10.6*  HCT 35.5* 32.3*  MCV 91.5 91.5  PLT 132* 110*   Cardiac Enzymes: No results for input(s): "CKTOTAL", "CKMB", "CKMBINDEX", "TROPONINI" in the last 168 hours. BNP: Invalid input(s): "POCBNP" CBG: Recent Labs  Lab 12/15/21 1112  GLUCAP 125*   D-Dimer No results for input(s): "DDIMER" in the last 72 hours. Hgb A1c No results for input(s): "HGBA1C" in the last 72 hours. Lipid Profile No results for input(s): "CHOL", "HDL", "LDLCALC", "TRIG", "CHOLHDL", "LDLDIRECT" in the last 72 hours. Thyroid function studies No results for input(s): "TSH", "T4TOTAL", "T3FREE", "THYROIDAB" in the last 72 hours.  Invalid input(s): "FREET3" Anemia work up No results for input(s): "VITAMINB12", "FOLATE", "FERRITIN", "TIBC", "IRON", "RETICCTPCT" in the last 72 hours. Urinalysis    Component Value Date/Time   COLORURINE YELLOW 12/14/2021 1837   APPEARANCEUR HAZY (A)  12/14/2021 1837   LABSPEC 1.019 12/14/2021 1837   PHURINE 5.0 12/14/2021 1837   GLUCOSEU NEGATIVE 12/14/2021 1837   GLUCOSEU NEGATIVE 05/25/2006 1353   HGBUR NEGATIVE 12/14/2021 1837   BILIRUBINUR NEGATIVE 12/14/2021 1837   BILIRUBINUR negative 12/25/2018 1408   KETONESUR 5 (A) 12/14/2021 1837   PROTEINUR NEGATIVE 12/14/2021 1837   UROBILINOGEN 0.2 12/25/2018 1408   UROBILINOGEN 0.2 mg/dL 05/25/2006 1353   NITRITE NEGATIVE 12/14/2021 1837   LEUKOCYTESUR NEGATIVE 12/14/2021 1837   Sepsis Labs Recent Labs  Lab 12/14/21 0406 12/15/21 0343  WBC 6.2 5.1   Microbiology Recent Results (from the past 240 hour(s))  Blood culture (routine x 2)     Status: None (Preliminary result)   Collection Time: 12/14/21 10:21 AM   Specimen: BLOOD RIGHT FOREARM  Result Value Ref Range Status   Specimen Description BLOOD RIGHT FOREARM  Final   Special Requests   Final    BOTTLES DRAWN AEROBIC AND ANAEROBIC Blood Culture adequate volume   Culture   Final    NO GROWTH < 24 HOURS Performed at [facility], [place] [address], [place], [place] [address]    Report Status PENDING  Incomplete  Blood culture (routine x 2)     Status: None (Preliminary result)   Collection Time: 12/14/21 10:36 AM   Specimen: BLOOD  Result Value Ref Range Status   Specimen Description BLOOD RIGHT ANTECUBITAL  Final   Special Requests   Final    BOTTLES DRAWN AEROBIC AND ANAEROBIC Blood Culture results may not be optimal due to an inadequate volume of blood received in culture bottles   Culture   Final    NO GROWTH <  24 HOURS Performed at [facility], [place] [address], [place], [place] [address]    Report Status PENDING  Incomplete  Urine Culture     Status: None   Collection Time: 12/14/21  6:37 PM   Specimen: Urine, Clean Catch  Result Value Ref Range Status   Specimen Description URINE, CLEAN CATCH  Final   Special Requests NONE  Final   Culture   Final    NO GROWTH Performed at [facility], [place] [address], [place], [place] [address]    Report Status 12/15/2021 FINAL  Final  MRSA Next Gen by PCR, Nasal     Status: None   Collection Time: 12/15/21  8:40 AM   Specimen: Nasal Mucosa; Nasal Swab  Result Value Ref Range Status   MRSA by PCR Next Gen NOT DETECTED NOT DETECTED Final    Comment: (NOTE) The GeneXpert MRSA Assay (FDA approved for NASAL specimens only), is one component of a comprehensive MRSA colonization surveillance program. It is not intended to diagnose MRSA infection nor to guide or monitor treatment for MRSA infections. Test performance is not FDA approved in patients less than 73 years old. Performed at [facility], [place] [address], [place], [place] [address]      Time coordinating discharge: Over 30 minutes  SIGNED:   Little Ishikawa, DO [facility] 12/15/2021, 5:08 PM Pager   If 7PM-7AM, please contact night-coverage [email or web address]

## 2021-12-15 NOTE — Evaluation (Signed)
Physical Therapy Evaluation & Discharge Patient Details Name: Tim [name] MRN: [id number] DOB: May 16, 1941 Today's Date: 12/15/2021  History of Present Illness  Pt is an 81 y.o. male admitted 12/14/21 with RLE cellulitis, transient hypotension. PMH includes HF, PAF on chronic anticoagulation, AS s/p TAVR, DM2, obesity.   Clinical Impression  Patient evaluated by Physical Therapy with no further acute PT needs identified. PTA, pt mod indep ambulator with forearm crutches due to chronic back pain and BLE weakness; pt lives with supportive wife who assists with ADL/iADLs as needed. Today, pt moving well with RW at supervision-level; endorses back pain due to RW use. Pt pleasantly declines need for additional acute or follow-up PT services. All education has been completed and the patient has no further questions. Acute PT is signing off. Thank you for this referral.   HR 126 with ambulation Post-mobility BP 113/69   Recommendations for follow up therapy are one component of a multi-disciplinary discharge planning process, led by the attending physician.  Recommendations may be updated based on patient status, additional functional criteria and insurance authorization.  Follow Up Recommendations No PT follow up    Assistance Recommended at Discharge Set up Supervision/Assistance  Patient can return home with the following  A little help with bathing/dressing/bathroom;Assistance with cooking/housework;Assist for transportation    Equipment Recommendations None recommended by PT  Recommendations for Other Services       Functional Status Assessment Patient has not had a recent decline in their functional status     Precautions / Restrictions Precautions Precautions: Fall Restrictions Weight Bearing Restrictions: No      Mobility  Bed Mobility Overal bed mobility: Independent                  Transfers Overall transfer level: Modified independent Equipment used: Rolling  walker (2 wheels) Transfers: Sit to/from Stand                  Ambulation/Gait Ambulation/Gait assistance: Supervision Gait Distance (Feet): 18 Feet Assistive device: Rolling walker (2 wheels) Gait Pattern/deviations: Step-through pattern, Decreased stride length, Trunk flexed Gait velocity: Decreased     General Gait Details: slow, steady gait with RW and supervision for safety/lines; pt endorses increased lower back pain due to RW use (which he reports does not occur with forearm crutches), declined further distance secondary to this  Stairs            Wheelchair Mobility    Modified Rankin (Stroke Patients Only)       Balance Overall balance assessment: Needs assistance Sitting-balance support: No upper extremity supported, Feet supported Sitting balance-Leahy Scale: Good Sitting balance - Comments: indep to don bilateral socks (figure four technique) sittin EOB   Standing balance support: Bilateral upper extremity supported, No upper extremity supported Standing balance-Leahy Scale: Fair Standing balance comment: can static stand without UE support                             Pertinent Vitals/Pain Pain Assessment Pain Assessment: Faces Faces Pain Scale: Hurts little more Pain Location: back Pain Descriptors / Indicators: Discomfort, Grimacing, Guarding Pain Intervention(s): Monitored during session, Limited activity within patient's tolerance    Home Living Family/patient expects to be discharged to:: Private residence Living Arrangements: Spouse/significant other Available Help at Discharge: Family;Available 24 hours/day Type of Home: House Home Access: Stairs to enter Entrance Stairs-Rails: Left Entrance Stairs-Number of Steps: 4 (stair lift recently installed, but pt does not  want to use yet)   Home Layout: Two level;Able to live on main level with bedroom/bathroom Home Equipment: Rolling Walker (2 wheels);Rollator (4 wheels);Cane -  single point;Crutches;Shower seat;Grab bars - tub/shower;Grab bars - toilet;Adaptive equipment      Prior Function Prior Level of Function : Needs assist       Physical Assist : ADLs (physical)     Mobility Comments: Mod indep ambulator with bilateral forearm crutches; pt worked with HHPT after past prolonged admission and found these were only DME that did not cause significant worsening back pain. ADLs Comments: pt reports mod indep with toileting; wife assists with bird baths at sink or EOB; wife performs household tasks     Hand Dominance        Extremity/Trunk Assessment   Upper Extremity Assessment Upper Extremity Assessment: Overall WFL for tasks assessed    Lower Extremity Assessment Lower Extremity Assessment: RLE deficits/detail;LLE deficits/detail RLE Deficits / Details: noted R lower leg swelling, redness, wound; pt endorses h/o neuropathy ("the nerves don't work in the lower half of my legs") with significantly decreased sensation in lower legs/feet; functional strength >3/5 RLE Sensation: decreased light touch;history of peripheral neuropathy LLE Deficits / Details: noted L lower leg swelling, discoloration; pt endorses h/o neuropathy ("the nerves don't work in the lower half of my legs") with significantly decreased sensation in lower legs/feet; functional strength >3/5 LLE Sensation: decreased light touch;history of peripheral neuropathy       Communication   Communication: HOH  Cognition Arousal/Alertness: Awake/alert Behavior During Therapy: WFL for tasks assessed/performed Overall Cognitive Status: Within Functional Limits for tasks assessed                                          General Comments General comments (skin integrity, edema, etc.): HR up to 126 with ambulation, post-ambulation BP 113/69. pt pleasantly declines need for additional acute PT or follow-up PT services; reports good awareness of education/therex from prior HHPT  sessions    Exercises     Assessment/Plan    PT Assessment Patient does not need any further PT services  PT Problem List         PT Treatment Interventions      PT Goals (Current goals can be found in the Care Plan section)  Acute Rehab PT Goals PT Goal Formulation: All assessment and education complete, DC therapy    Frequency       Co-evaluation               AM-PAC PT "6 Clicks" Mobility  Outcome Measure Help needed turning from your back to your side while in a flat bed without using bedrails?: None Help needed moving from lying on your back to sitting on the side of a flat bed without using bedrails?: None Help needed moving to and from a bed to a chair (including a wheelchair)?: None Help needed standing up from a chair using your arms (e.g., wheelchair or bedside chair)?: None Help needed to walk in hospital room?: A Little Help needed climbing 3-5 steps with a railing? : A Little 6 Click Score: 22    End of Session Equipment Utilized During Treatment: Gait belt Activity Tolerance: Patient tolerated treatment well Patient left: in chair;with call bell/phone within reach Nurse Communication: Mobility status PT Visit Diagnosis: Other abnormalities of gait and mobility (R26.89)    Time: 6283-6629 PT Time Calculation (  min) (ACUTE ONLY): 22 min   Charges:   PT Evaluation $PT Eval Low Complexity: [place], PT, DPT Acute Rehabilitation Services  Pager [phone] Office [place] 12/15/2021, 2:38 PM

## 2021-12-15 NOTE — Care Management CC44 (Signed)
Condition Code 44 Documentation Completed  Patient Details  Name: Tim [name] MRN: [id number] Date of Birth: 10-22-1940   Condition Code 44 given:  Yes Patient signature on Condition Code 44 notice:  Yes Documentation of 2 MD's agreement:  Yes Code 44 added to claim:  Yes    Angelita Ingles, RN 12/15/2021, 2:34 PM

## 2021-12-15 NOTE — TOC Benefit Eligibility Note (Addendum)
Patient Teacher, English as a foreign language completed.    The patient is currently admitted and upon discharge could be taking linezolid (Zyvox) 600 mg tablets.  The current 14 day co-pay is, $27.57.   The current 7 day co-pay is, $13.91.   The patient is insured through [place], [place] Patient Advocate Specialist [place] Patient Advocate Team Direct Number: [phone]  Fax: [phone]

## 2021-12-15 NOTE — Care Management Obs Status (Signed)
[facility] NOTIFICATION   Patient Details  Name: Tim [name] MRN: [id number] Date of Birth: Apr 03, 1941   Medicare Observation Status Notification Given:  Yes    Angelita Ingles, RN 12/15/2021, 2:33 PM

## 2021-12-19 LAB — CULTURE, BLOOD (ROUTINE X 2)
Culture: NO GROWTH
Culture: NO GROWTH
Special Requests: ADEQUATE

## 2021-12-20 ENCOUNTER — Encounter: Payer: Self-pay | Admitting: Family Medicine

## 2021-12-20 ENCOUNTER — Ambulatory Visit: Payer: Medicare Other | Admitting: Family Medicine

## 2021-12-20 ENCOUNTER — Telehealth: Payer: Self-pay | Admitting: *Deleted

## 2021-12-20 VITALS — BP 92/58 | HR 73 | Temp 97.9°F

## 2021-12-20 DIAGNOSIS — L03115 Cellulitis of right lower limb: Secondary | ICD-10-CM | POA: Diagnosis not present

## 2021-12-20 DIAGNOSIS — Z8679 Personal history of other diseases of the circulatory system: Secondary | ICD-10-CM

## 2021-12-20 DIAGNOSIS — D509 Iron deficiency anemia, unspecified: Secondary | ICD-10-CM

## 2021-12-20 DIAGNOSIS — I952 Hypotension due to drugs: Secondary | ICD-10-CM

## 2021-12-20 DIAGNOSIS — Z8614 Personal history of Methicillin resistant Staphylococcus aureus infection: Secondary | ICD-10-CM

## 2021-12-20 LAB — CBC WITH DIFFERENTIAL/PLATELET
Basophils Absolute: 0.1 10*3/uL (ref 0.0–0.1)
Basophils Relative: 1.3 % (ref 0.0–3.0)
Eosinophils Absolute: 0.2 10*3/uL (ref 0.0–0.7)
Eosinophils Relative: 4.2 % (ref 0.0–5.0)
HCT: 33.7 % — ABNORMAL LOW (ref 39.0–52.0)
Hemoglobin: 11.7 g/dL — ABNORMAL LOW (ref 13.0–17.0)
Lymphocytes Relative: 26.1 % (ref 12.0–46.0)
Lymphs Abs: 1.3 10*3/uL (ref 0.7–4.0)
MCHC: 34.5 g/dL (ref 30.0–36.0)
MCV: 90.6 fl (ref 78.0–100.0)
Monocytes Absolute: 0.4 10*3/uL (ref 0.1–1.0)
Monocytes Relative: 9.1 % (ref 3.0–12.0)
Neutro Abs: 2.9 10*3/uL (ref 1.4–7.7)
Neutrophils Relative %: 59.3 % (ref 43.0–77.0)
Platelets: 121 10*3/uL — ABNORMAL LOW (ref 150.0–400.0)
RBC: 3.72 Mil/uL — ABNORMAL LOW (ref 4.22–5.81)
RDW: 15 % (ref 11.5–15.5)
WBC: 4.9 10*3/uL (ref 4.0–10.5)

## 2021-12-20 MED ORDER — LINEZOLID 600 MG PO TABS: 600.0000 mg | ORAL_TABLET | Freq: Two times a day (BID) | ORAL | 0 refills | Status: DC

## 2021-12-20 NOTE — Progress Notes (Signed)
12/20/2021  CC:  Chief Complaint  Patient presents with   Hospitalization Follow-up    Patient is a 81 y.o.  male who presents for  hospital follow up. Dates hospitalized: 6/14-6/15, 2023. Days since d/c from hospital: 5 days Patient was discharged from hospital to home. Reason for admission to hospital: R leg cellulitis.  I have reviewed patient's discharge summary plus pertinent specific notes, labs, and imaging from the hospitalization.   Patient admitted with right lower leg swelling and redness.  Started on Zyvox and showed significant improvement quickly.  Was discharged home on Zyvox to finish 12 doses.  CURRENTLY: He feels like the redness on right lower leg has turned more deep pink and has shrunk a bit in size but he is worried that significant infection is still present.  He has no pain.  No malaise or fever. He is eating and drinking well.    His blood pressures have tended to run low normal range but lately has been even lower.  In the hospital down to the 90s over 50s fairly commonly.  Same thing at home. Not regularly having orthostatic dizziness but yesterday he had near syncope after walking into the bathroom lightheaded. Denies chest pain or shortness of breath.  Medication reconciliation was done today and patient is taking meds as recommended by discharging hospitalist/specialist.    ROS as above, plus-->  no wheezing, no cough,  no HAs, no rashes, no melena/hematochezia.  No polyuria or polydipsia.  No myalgias or arthralgias.  No focal weakness, paresthesias, or tremors.  No acute vision or hearing abnormalities.  No dysuria or unusual/new urinary urgency or frequency.  No recent changes in lower legs. No n/v/d or abd pain.  No palpitations.    PMH:  Past Medical History:  Diagnosis Date   AICD (automatic cardioverter/defibrillator) present    Balanitis    +severe phimosis+ buried penis->circ not possible but dorsal slit done 10/2019   BPH (benign prostatic  hypertrophy)    with urinary retention.  Renal u/s 12/04/19 NORMAL KIDNEYS, NORMAL BLADDER, NO HYDRONEPHROSIS   CAD (coronary artery disease)    Nonobstructive by 12/2012 cath; then 03/2016 he required BMS to RCA ([facility]).  In-stent restenosis on cath 11/01/16, baloon angioplasty successful.   CATARACT, HX OF    Chronic combined systolic and diastolic CHF (congestive heart failure) ([place]) 05/2016   Ischemic CM.  04/2018 EF 40-45%, grd I DD.  07/2021 EF 55-60%, AV well seated   Chronic pain syndrome    Lumbar DDD; chronic neuropathic pain (DM); has spinal stimulator and sees pain mgmt MD   Complicated UTI (urinary tract infection) 09/2019   Phimoses, acute urinary retention, entoerococcus UTI, enterococc bacteremia.  F/u blood clx's neg x 5d.     COPD    Debilitated patient    Diabetes mellitus type 2 with complications ([facility])    GNO0B jumped from 5.7% to 6.1% 03/2015---started metformin at that time.  DM 2 dx by fasting gluc criteria 2018.  Has chronic neuropath pain   Enterococcal bacteremia 09/2019   Phimoses, acute urinary retention, entoerococcus UTI, enterococc bacteremia.  F/u blood clx's neg x 5d.     Essential hypertension, benign    Fatty liver 2007   2007 u/s showed fatty liver with hepatosplenomegaly.  2019 repeat u/s->fatty liver but no cirrhosis or hepatosplenomegaly.   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 Tommy Medal)   Lumbosacral neuritis    Lumbosacral spondylosis    Lumbar spinal stenosis with neurogenic claudication--contributes to his chronic pain syndrome   Morbid obesity ([place])    Normal memory function 08/2014    Neuropsychological testing ([facility]): no cognitive impairment or sign of neurodegenerative disorder.  Likely has adjustment d/o with mixed anxiety/depressed features and may benefit from low dose SNRI.     Normocytic anemia 03/2016   Mild-pt needs ferritin and vit B12 level checked (as of 03/22/16). Hb stable 09/2019   NSTEMI (non-ST elevated myocardial infarction) ([place]) 03/20/2016   BMS to RCA   Obesity hypoventilation syndrome (HCC)    Orthostatic hypotension    OSA on CPAP    8 cm H2O   OSTEOARTHRITIS    Paroxysmal atrial fibrillation ([place]) 2003    (? chronic?) Off anticoag for a while due to falls.  Then apixaban started 12/2014.   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 Ella Bodo): neuropathic pain->diff to treat, failed nucynta, failed spinal stimul trial, oxycontin hs + tramadol + gabap as of 12/2017 f/u Dr. Letta Pate.   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   Sick sinus syndrome (HCC)    PPM placed   Thrombocytopenia ([place]) 2018   HSM on 2007 abd u/s---suspect some mild splenic sequestration chronically.   Unspecified glaucoma(365.9)    Unspecified hereditary and idiopathic peripheral neuropathy approx age 56   bilat LE's, ? left arm, too.  Feet became  progressively numb + left foot pain intermittently.  Pt may be trying a spinal stimulator (as of 05/2015)   Vaccine counseling 11/16/2021   VENOUS INSUFFICIENCY    Being followed by Dr. Sharol Given as of 10/2016 for two R LL venous stasis ulcers/skin tears.  Healed as of 10/30/16 f/u with Dr. Sharol Given.   VENTRAL HERNIA     PSH:  Past Surgical History:  Procedure Laterality Date   AMPUTATION Left 04/11/2013   Procedure: AMPUTATION DIGIT Left 3rd toe;  Surgeon: [name], MD;  Location: [place];  Service: Orthopedics;  Laterality: Left;  Left 3rd toe amputation at [facility]   AMPUTATION Left 08/29/2019   Procedure: LEFT TRANSMETATARSAL AMPUTATION;  Surgeon: [name], MD;  Location: [place];  Service: Orthopedics;  Laterality: Left;   BIOPSY  05/04/2020   Procedure: BIOPSY;  Surgeon: Jackquline Denmark, MD;  Location: [facility] ENDOSCOPY;  Service: Endoscopy;;   [place]; 03/10/16   1997 Non-obstructive disease.  03/2016 BMS to RCA, with 25% pDiag  dz, o/w normal cors per cath 03/07/16.  Cath 11/01/16: in stent restenosis, successful baloon angioplasty. 08/2020 branch vessel dz, cont medical therapy rec'd.   CARDIAC CATHETERIZATION  12/24/2012   mild < 20% LCx, prox 30% RCA; LVEF 55-65% , moderate pulmonary HTN, moderate AS   CARDIAC DEFIBRILLATOR PLACEMENT  11/07/2017   [facility] MRI Quad CRT defibrillator   CARDIOVASCULAR STRESS TEST  05/11/16 ([facility])   2017 Myocardial perfusion imaging:  No ischemia; scar in apex, global hypokinesis, EF 36%.  06/15/20->mod primarily fixed inferolat wall defect mildly worse with stress c/w infarct/scar with mild peri-infarct ischemia, normal EF-->for cath per cards.   Carotid dopplers  03/09/2016   [facility]: no hemodynamically significant stenosis on either side.   CHOLECYSTECTOMY     COLONOSCOPY [name] 10/30/2012   Procedure: COLONOSCOPY;  Surgeon: [name], MD;  Location: [facility] ENDOSCOPY;  Service: Endoscopy;  Laterality: [name];   CORONARY ANGIOPLASTY WITH STENT PLACEMENT   03/2016; 04/2017   2017-[facility]: BMS to RCA-pt was placed on Brilinta.  04/2017: DES to RCA.   DORSAL SLIT [name] 10/29/2019   for severe phimosis. Procedure: DORSAL SLIT;  Surgeon: Alexis Frock, MD;  Location: [facility] ORS;  Service: Urology;  Laterality: [name];  [place]  05/04/2020   Procedure: ESOPHAGEAL DILATION;  Surgeon: Jackquline Denmark, MD;  Location[facility] END[facility]COPY;  Service: Endoscopy;;   ESOPHAGOGASTRODUODENOSCOPY (EGD) WITH PROPOFOL [name] 05/04/2020   Procedure: ESOPHAGOGASTRODUODENOSCOPY (EGD) WITH PROPOFOL;  Surgeon: Jackquline Denmark, MD;  Location: [facility] ENDOSCOPY;  Service: Endoscopy;  Laterality: [name];   EYE SURGERY Bilateral cataract   HEMORRHOID SURGERY     INTRAOCULAR LENS INSERTION Bilateral    KNEE SURGERY Right    LEFT AND RIGHT HEART CATHETERIZATION WITH CORONARY ANGIOGRAM [name] 12/24/2012   Procedure: LEFT AND RIGHT HEART CATHETERIZATION WITH CORONARY ANGIOGRAM;  Surgeon: [name], MD;  Location: [facility] CATH LAB;  Service: Cardiovascular;  Laterality: [name];   LEG SURGERY Bilateral    lenghtening    PACEMAKER PLACEMENT  04/13/2016   2nd deg HB after TAVR, pt had DC MDT PPM placed.   SHOULDER ARTHROSCOPY  08/30/2011   Procedure: ARTHROSCOPY SHOULDER;  Surgeon: [name], MD;  Location: [place];  Service: Orthopedics;  Laterality: Right;  Right Shoulder Arthroscopy, Debridement, and Decompression   SPINAL CORD STIMULATOR INSERTION [name] 09/10/2015   Procedure: LUMBAR SPINAL CORD STIMULATOR INSERTION;  Surgeon: [name], MD;  Location: [place] NEURO ORS;  Service: Neurosurgery;  Laterality: [name];   TEE WITHOUT CARDIOVERSION [name] 09/16/2019   Procedure: TRANSESOPHAGEAL ECHOCARDIOGRAM (TEE);  Surgeon: Skeet Latch, MD;  Location[facility] END[facility]COPY;  Service: Cardiovascular;  Laterality: [name];   TEE WITHOUT CARDIOVERSION [name] 12/02/2019   +vegetation on AVR and pacer lead in RV.  EF 55-60%, normal wall motion.  Valves function normal.  Procedure: TRANSESOPHAGEAL ECHOCARDIOGRAM  (TEE);  Surgeon: Geralynn Rile, MD;  Location: [place];  Service: Cardiovascular;  Laterality: [name];   TOE AMPUTATION Left    due to osteomyelitis.  R big toe surg due to osteoarth   TONSILLECTOMY     traeculectomy Left    eye   TRANSCATHETER AORTIC VALVE REPLACEMENT, TRANSFEMORAL  04/11/2016   TRANSESOPHAGEAL ECHOCARDIOGRAM  03/09/2016; 09/2019   [facility]: EF 55-60%, PFO seen with bi-directional shunting, no thrombus in appendage.  09/2019 ->no valvular vegetations. Small patent foramen ovale with predominantly left to right shunting across the interatrial septum.   TRANSTHORACIC ECHOCARDIOGRAM  01/2015; 01/2016; 05/18/16; 09/18/16, 05/2017, 08/2017   01/2015 No signif change in  aortic stenosis (moderate).  01/2016 Severe LVH w/small LV cavity, EF 60-65%, grade I diast dysfxn.  05/2016 (s/p TAVR): EF 50-55%, grd I DD, biopros AV good.  08/2016--EF 50-55%, LV septal motion c/w conduction abnl, grd I DD,mild MS,bioprosth aortic valve well seated, w/trace AR. 05/2017 TTE EF 35%. 08/2017-EF 35%, mod diff hypokin LV, grd I DD, biopros AV good.    TRANSTHORACIC ECHOCARDIOGRAM  04/2018; 09/2019   04/2018: EF 40-45%, mod diffuse LV hypokin, grd I DD, bioprosth AV well seated, no AS or AR. 09/2019 EF 60-65%, grd I DD, valves fine, including bioprosth AV. 04/29/20 (tech diff) EF 55-60%, grd I DD, vegetation on MV.  07/2021 EF 55-60% (s/p upgrade to BiV PPM), AV well seated.   VITRECTOMY      MEDS:  Outpatient Medications Prior to Visit  Medication Sig Dispense Refill   albuterol (PROVENTIL HFA;VENTOLIN HFA) 108 (90 BASE) MCG/ACT inhaler Inhale 2 puffs into the lungs 4 (four) times daily as needed for wheezing or shortness of breath.     allopurinol (ZYLOPRIM) 300 MG tablet TAKE 1 TABLET BY MOUTH EVERY DAY WITH FOOD (Patient taking differently: Take 300 mg by mouth daily. With food) 90 tablet 0   apixaban (ELIQUIS) 5 MG TABS tablet Take 5 mg by mouth 2 (two) times daily.     ASPIRIN LOW DOSE 81 MG EC tablet  Take 81 mg by mouth daily.     B Complex-C (B-COMPLEX WITH VITAMIN C) tablet Take 1 tablet by mouth daily. Take 1 tablet daily, 218m     ezetimibe (ZETIA) 10 MG tablet Take 1 tablet (10 mg total) by mouth daily. 30 tablet 1   ferrous sulfate 324 MG TBEC Take 324 mg by mouth daily. With supper     finasteride (PROSCAR) 5 MG tablet TAKE 1 TABLET BY MOUTH EVERY DAY (Patient taking differently: Take 5 mg by mouth daily.) 90 tablet 1   gabapentin (NEURONTIN) 800 MG tablet Take 1 tablet (800 mg total) by mouth 4 (four) times daily. 360 tablet 3   KLOR-CON M20 20 MEQ tablet TAKE 1 TABLET BY MOUTH EVERY DAY (Patient taking differently: Take 10 mEq by mouth 2 (two) times daily.) 90 tablet 1   linezolid (ZYVOX) 600 MG tablet Take 1 tablet (600 mg total) by mouth every 12 (twelve) hours for 12 doses. 12 tablet 0   metoprolol succinate (TOPROL-XL) 25 MG 24 hr tablet Take 25 mg by mouth daily.     Multiple Vitamin (MULTIVITAMIN ADULT PO) Take 1 tablet by mouth daily.     niacin (NIASPAN) 1000 MG CR tablet TAKE 1 TABLET BY MOUTH TWICE A DAY (Patient taking differently: Take 1,000 mg by mouth in the morning and at bedtime.) 180 tablet 0   pantoprazole (PROTONIX) 40 MG tablet TAKE 1 TABLET BY MOUTH EVERY DAY (Patient taking differently: Take 40 mg by mouth daily.) 90 tablet 0   Probiotic Product (PROBIOTIC DAILY PO) Take 1 capsule by mouth daily.     rosuvastatin (CRESTOR) 40 MG tablet take 1 tablet by mouth once daily (Patient taking differently: Take 40 mg by mouth daily.) 15 tablet 0   sacubitril-valsartan (ENTRESTO) 24-26 MG Take 1 tablet by mouth 2 (two) times daily.      vitamin C (ASCORBIC ACID) 250 MG tablet Take 250 mg by mouth daily.     VITAMIN D, CHOLECALCIFEROL, PO Take 1 tablet by mouth daily.      WIXELA INHUB 250-50 MCG/ACT AEPB TAKE 1 PUFF BY MOUTH TWICE A  DAY (Patient taking differently: Inhale 1 puff into the lungs in the morning and at bedtime.) 60 each 5   amoxicillin (AMOXIL) 500 MG tablet  Take 2 tablets (1,000 mg total) by mouth 2 (two) times daily. Resume taking 6/21 PM after linezolid course completed (Patient not taking: Reported on 12/20/2021) 120 tablet 11   nitroGLYCERIN (NITROSTAT) 0.4 MG SL tablet Place 0.4 mg under the tongue every 5 (five) minutes as needed for chest pain. (Patient not taking: Reported on 12/20/2021)     No facility-administered medications prior to visit.    Physical Exam    12/20/2021   11:41 AM 12/15/2021    4:00 PM 12/15/2021    3:00 PM  Vitals with BMI  Systolic 92 063 016  Diastolic 58 54 61  Pulse 73 90 93   General: Alert and well-appearing. Right lower leg with erythema on the entire anterior aspect extending around medially and posteriorly.  He has some crustiness of the skin but no distinct weeping or ulceration.  Nontender.  No significant pitting edema. Left lower extremity has fibrotic changes and bronzing but no erythema.  Pertinent labs/imaging Last CBC Lab Results  Component Value Date   WBC 5.1 12/15/2021   HGB 10.6 (L) 12/15/2021   HCT 32.3 (L) 12/15/2021   MCV 91.5 12/15/2021   MCH 30.0 12/15/2021   RDW 14.4 12/15/2021   PLT 110 (L) 12/15/2021   Lab Results  Component Value Date   IRON 60 10/13/2021   TIBC 292 10/13/2021   FERRITIN 17 (L) 10/13/2021   Lab Results  Component Value Date   VITAMINB12 429 07/11/3233   Last metabolic panel Lab Results  Component Value Date   GLUCOSE 123 (H) 12/15/2021   NA 138 12/15/2021   K 3.8 12/15/2021   CL 105 12/15/2021   CO2 23 12/15/2021   BUN 12 12/15/2021   CREATININE 0.76 12/15/2021   GFRNONAA >60 12/15/2021   CALCIUM 8.7 (L) 12/15/2021   PROT 5.5 (L) 12/15/2021   ALBUMIN 2.8 (L) 12/15/2021   BILITOT 0.7 12/15/2021   ALKPHOS 49 12/15/2021   AST 53 (H) 12/15/2021   ALT 35 12/15/2021   ANIONGAP 10 12/15/2021   Last hemoglobin A1c Lab Results  Component Value Date   HGBA1C 6.1 10/13/2021   ASSESSMENT/PLAN:  #1 right lower extremity cellulitis.  Blood  cultures negative x5 days. Broad spectrum coverage, including coverage for MRSA is indicated due to extensive past history of osteomyelitis and endocarditis. There has been some improvement but he I will extend his a linezolid 600 mg bid for another 7 days.  #2 hypotension.  Worse lately.  Fluid intake sounds adequate.  He is not on any diuretic. Has history of combined chronic systolic and diastolic heart failure, with most recent echo 07/2021 with EF 55 to 60%.  He does have a biventricular pacemaker. I would love to keep Entresto and Toprol on board but need to decrease 1 of these to allow BP to hopefully come up. Decided to see if he could split his Entresto tab in half and take twice daily.  If he is unable to split it in half I want him to just discontinue this med completely for now and we will recheck him in a week. Patient states the plan for next cardiology follow-up is not until March of next year.  #3 iron deficiency anemia. I started him on an iron tab daily 2 months ago. Most recent hemoglobin was 10.6, compared to 11.2 when I started  him on iron Rechecking CBC and iron panel today.  Hemoccults ordered today as well. Of note, stools have been dark brown but hard to tell if this is just since starting oral iron or not.  FOLLOW UP:  1 wk [facility] (next a1c) 1 mo  Signed:  Crissie Sickles, MD           12/20/2021

## 2021-12-20 NOTE — Patient Instructions (Signed)
If your entresto (sacubitril-valsartan) tab is big enough to split, take 1/2 tab twice daily.  If it is too small to split then stop this med completely.  Monitor blood pressure and heart rate 1-2 times a day.

## 2021-12-20 NOTE — Telephone Encounter (Signed)
Transition Care Management Follow-up Telephone Call Date of discharge and from where: 12/15/21 [facility] How have you been since you were released from the hospital? "My doctor told me today Im doing OK" Any questions or concerns? No  Items Reviewed: Did the pt receive and understand the discharge instructions provided? Yes  Medications obtained and verified? No -patient states these were reviewed with him during his office visit today Other? No  Any new allergies since your discharge? No  Dietary orders reviewed? No Do you have support at home? Yes   Home Care and Equipment/Supplies: Were home health services ordered? no If so, what is the name of the agency? Not applicable  Has the agency set up a time to come to the patient's home? not applicable Were any new equipment or medical supplies ordered?  No What is the name of the medical supply agency? Not applicable Were you able to get the supplies/equipment? not applicable Do you have any questions related to the use of the equipment or supplies? Not applicable  Functional Questionnaire: (I = Independent and D = Dependent) ADLs: I  Bathing/Dressing- I  Meal Prep- I  Eating- I  Maintaining continence- I  Transferring/Ambulation- I  Managing Meds- I  Follow up appointments reviewed:  PCP Hospital f/u appt confirmed? Yes  Completed hospital follow up visit with Dr Anitra Lauth today 6/20 at 11:20 am. He will see him again on 12/28/21 at 2:40 pm [facility] f/u appt confirmed? Yes  Scheduled to see Dr Tommy Medal on 6/27 @ 9:00 am. Are transportation arrangements needed? No  If their condition worsens, is the pt aware to call PCP or go to the Emergency Dept.? Yes Was the patient provided with contact information for the PCP's office or ED? Yes Was to pt encouraged to call back with questions or concerns? Yes   Kelli Churn RN, CCM, [facility] Care Management Coordinator   [phone]

## 2021-12-21 LAB — IRON,TIBC AND FERRITIN PANEL
%SAT: 66 % (calc) — ABNORMAL HIGH (ref 20–48)
Ferritin: 60 ng/mL (ref 24–380)
Iron: 173 ug/dL (ref 50–180)
TIBC: 262 mcg/dL (calc) (ref 250–425)

## 2021-12-22 ENCOUNTER — Encounter: Payer: Self-pay | Admitting: Infectious Diseases

## 2021-12-23 ENCOUNTER — Telehealth: Payer: Self-pay

## 2021-12-26 ENCOUNTER — Ambulatory Visit: Payer: Medicare Other | Admitting: Family Medicine

## 2021-12-26 ENCOUNTER — Encounter: Payer: Self-pay | Admitting: Family Medicine

## 2021-12-26 VITALS — BP 122/76 | HR 86 | Temp 98.6°F | Ht 76.0 in

## 2021-12-26 DIAGNOSIS — Z7689 Persons encountering health services in other specified circumstances: Secondary | ICD-10-CM

## 2021-12-26 DIAGNOSIS — I872 Venous insufficiency (chronic) (peripheral): Secondary | ICD-10-CM

## 2021-12-26 DIAGNOSIS — R2681 Unsteadiness on feet: Secondary | ICD-10-CM

## 2021-12-26 DIAGNOSIS — L03115 Cellulitis of right lower limb: Secondary | ICD-10-CM | POA: Diagnosis not present

## 2021-12-26 DIAGNOSIS — I952 Hypotension due to drugs: Secondary | ICD-10-CM | POA: Diagnosis not present

## 2021-12-26 DIAGNOSIS — Z993 Dependence on wheelchair: Secondary | ICD-10-CM

## 2021-12-26 MED ORDER — LINEZOLID 600 MG PO TABS: 600.0000 mg | ORAL_TABLET | Freq: Two times a day (BID) | ORAL | 0 refills | Status: DC

## 2021-12-27 ENCOUNTER — Ambulatory Visit (INDEPENDENT_AMBULATORY_CARE_PROVIDER_SITE_OTHER): Payer: Medicare Other | Admitting: Infectious Disease

## 2021-12-27 ENCOUNTER — Telehealth: Payer: Self-pay

## 2021-12-27 ENCOUNTER — Encounter: Payer: Self-pay | Admitting: Infectious Disease

## 2021-12-27 ENCOUNTER — Other Ambulatory Visit: Payer: Self-pay

## 2021-12-27 ENCOUNTER — Telehealth: Payer: Self-pay | Admitting: Orthopedic Surgery

## 2021-12-27 VITALS — BP 132/75 | HR 78 | Temp 97.9°F

## 2021-12-27 DIAGNOSIS — I872 Venous insufficiency (chronic) (peripheral): Secondary | ICD-10-CM | POA: Diagnosis not present

## 2021-12-27 DIAGNOSIS — I890 Lymphedema, not elsewhere classified: Secondary | ICD-10-CM | POA: Diagnosis not present

## 2021-12-27 DIAGNOSIS — L03115 Cellulitis of right lower limb: Secondary | ICD-10-CM

## 2021-12-27 DIAGNOSIS — T827XXD Infection and inflammatory reaction due to other cardiac and vascular devices, implants and grafts, subsequent encounter: Secondary | ICD-10-CM | POA: Diagnosis not present

## 2021-12-27 DIAGNOSIS — I38 Endocarditis, valve unspecified: Secondary | ICD-10-CM

## 2021-12-27 DIAGNOSIS — M86272 Subacute osteomyelitis, left ankle and foot: Secondary | ICD-10-CM

## 2021-12-27 DIAGNOSIS — T826XXD Infection and inflammatory reaction due to cardiac valve prosthesis, subsequent encounter: Secondary | ICD-10-CM

## 2021-12-27 DIAGNOSIS — Z89512 Acquired absence of left leg below knee: Secondary | ICD-10-CM

## 2021-12-27 HISTORY — DX: Venous insufficiency (chronic) (peripheral): I87.2

## 2021-12-27 LAB — CBC WITH DIFFERENTIAL/PLATELET
Absolute Monocytes: 494 cells/uL (ref 200–950)
Basophils Absolute: 71 cells/uL (ref 0–200)
Basophils Relative: 1.5 %
Eosinophils Absolute: 169 cells/uL (ref 15–500)
Eosinophils Relative: 3.6 %
HCT: 32.9 % — ABNORMAL LOW (ref 38.5–50.0)
Hemoglobin: 11 g/dL — ABNORMAL LOW (ref 13.2–17.1)
Lymphs Abs: 1612 cells/uL (ref 850–3900)
MCH: 31.3 pg (ref 27.0–33.0)
MCHC: 33.4 g/dL (ref 32.0–36.0)
MCV: 93.5 fL (ref 80.0–100.0)
MPV: 10.4 fL (ref 7.5–12.5)
Monocytes Relative: 10.5 %
Neutro Abs: 2355 cells/uL (ref 1500–7800)
Neutrophils Relative %: 50.1 %
Platelets: 76 10*3/uL — ABNORMAL LOW (ref 140–400)
RBC: 3.52 10*6/uL — ABNORMAL LOW (ref 4.20–5.80)
RDW: 13.9 % (ref 11.0–15.0)
Total Lymphocyte: 34.3 %
WBC: 4.7 10*3/uL (ref 3.8–10.8)

## 2021-12-27 NOTE — Telephone Encounter (Signed)
Tim [name] saw his PCP concerning his cellulitis..his physician would like him to have an Radio broadcast assistant applied ASAP. Would you be able to work him in this week? Please advise

## 2021-12-28 ENCOUNTER — Encounter: Payer: Self-pay | Admitting: Family

## 2021-12-28 ENCOUNTER — Ambulatory Visit (INDEPENDENT_AMBULATORY_CARE_PROVIDER_SITE_OTHER): Payer: Medicare Other | Admitting: Family

## 2021-12-28 ENCOUNTER — Ambulatory Visit: Payer: Medicare Other | Admitting: Family Medicine

## 2021-12-28 DIAGNOSIS — L97521 Non-pressure chronic ulcer of other part of left foot limited to breakdown of skin: Secondary | ICD-10-CM

## 2021-12-28 DIAGNOSIS — I87332 Chronic venous hypertension (idiopathic) with ulcer and inflammation of left lower extremity: Secondary | ICD-10-CM | POA: Diagnosis not present

## 2021-12-28 DIAGNOSIS — Z89432 Acquired absence of left foot: Secondary | ICD-10-CM

## 2021-12-28 DIAGNOSIS — L03115 Cellulitis of right lower limb: Secondary | ICD-10-CM

## 2021-12-28 NOTE — Progress Notes (Signed)
Office Visit Note   Patient: Tim [name]           Date of Birth: June 20, 1941           MRN: [id number] Visit Date: 12/28/2021              Requested by: Tammi Sou, MD [address] [place],  [place] [address] PCP: Tammi Sou, MD  Chief Complaint  Patient presents with   Right Leg - Wound Check      HPI: Patient is seen in follow-up for venous stasis swelling as well as callus left transmetatarsal amputation patient is currently wearing a knee-high compression stocking.  Has recently been hospitalized for cellulitis with uncontrolled edema to his right lower extremity.  States he has been seen by primary care they are requesting Unna boot compression to the right lower extremity he is also concerned as he has begun having some bleeding from his left transmetatarsal amputation over the last few days  Of note patient states that the leads for his defibrillator are infected as well as his prosthetic valve. Follows with ID for this.  Just yesterday his Zyvox was discontinued he is back taking 1000 mg of amoxicillin twice daily  Assessment & Plan: Visit Diagnoses:  No diagnosis found.   Plan: unna And Dynaflex applied to the right lower extremity today he will follow-up in the office in 1 week.  Will evaluate the left foot at that time.  He will begin dry dressing changes to the left foot.  Follow-Up Instructions: No follow-ups on file.   Ortho Exam  [name], oriented, no adenopathy, well-dressed, normal affect, normal respiratory effort. Examination patient has pitting edema with venous insufficiency to the bilateral lower extremities right worse than left.  There is dry flaking skin and resolving cellulitis of the right lower extremity with mild erythema dry flaking skin.  Hemosiderin staining.  There are no open ulcers no weeping.  To the left lower extremity there is no erythema no warmth he does have hypertrophic callus to the end of his  transmetatarsal amputation on the left he has scant serosanguineous drainage this was debrided with a 10 blade knife there is no underlying abscess no purulence no erythema no odor.  Ulcer is 5 mm in diameter. Imaging: No results found. No images are attached to the encounter.  Labs: Lab Results  Component Value Date   HGBA1C 6.1 10/13/2021   HGBA1C 6.2 05/11/2021   HGBA1C 6.0 02/09/2021   ESRSEDRATE 30 (H) 12/15/2021   ESRSEDRATE 34 (H) 11/24/2020   ESRSEDRATE 38 (H) 07/14/2020   CRP 1.3 11/24/2020   CRP 0.9 07/14/2020   CRP 0.7 05/03/2020   LABURIC 3.5 (L) 09/10/2013   REPTSTATUS 12/15/2021 FINAL 12/14/2021   CULT  12/14/2021    NO GROWTH Performed at [facility], [place] [address] [place], [place] [address]    LABORGA ENTEROCOCCUS FAECALIS 09/11/2019     Lab Results  Component Value Date   ALBUMIN 2.8 (L) 12/15/2021   ALBUMIN 3.2 (L) 12/14/2021   ALBUMIN 3.4 (L) 10/13/2021    Lab Results  Component Value Date   MG 1.7 05/07/2020   MG 1.6 (L) 05/06/2020   MG 1.8 05/05/2020   No results found for: "VD25OH"  No results found for: "PREALBUMIN"    Latest Ref Rng & Units 12/27/2021    9:35 AM 12/20/2021   12:40 PM 12/15/2021    3:43 AM  CBC EXTENDED  WBC 3.8 - 10.8 Thousand/uL 4.7  4.9  5.1   RBC 4.20 - 5.80 Million/uL 3.52  3.72  3.53   Hemoglobin 13.2 - 17.1 g/dL 11.0  11.7  10.6   HCT 38.5 - 50.0 % 32.9  33.7  32.3   Platelets 140 - 400 Thousand/uL 76  121.0  110   NEUT# 1,500 - 7,800 cells/uL 2,355  2.9    Lymph# 850 - 3,900 cells/uL 1,612  1.3       There is no height or weight on file to calculate BMI.  Orders:  No orders of the defined types were placed in this encounter.  No orders of the defined types were placed in this encounter.    Procedures: No procedures performed  Clinical Data: No additional findings.  ROS:  All other systems negative, except as noted in the HPI. Review of Systems  Objective: Vital Signs: There were no  vitals taken for this visit.  Specialty Comments:  No specialty comments available.  PMFS History: Patient Active Problem List   Diagnosis Date Noted   Venous stasis dermatitis 12/27/2021   Cellulitis 12/14/2021   Wound of right lower extremity 12/14/2021   Transient hypotension 12/14/2021   Heart failure with preserved ejection fraction ([place]) 12/14/2021   Prediabetes 12/14/2021   Thrombocytopenia ([place]) 12/14/2021   Obesity, Class III, BMI 40-49.9 (morbid obesity) ([place]) 12/14/2021   BPH (benign prostatic hyperplasia) 12/14/2021   Vaccine counseling 11/16/2021   Weakness 02/23/2021   Physical deconditioning 02/23/2021   Rhinitis 07/14/2020   Mitral valve vegetation 05/28/2020   Non-pressure chronic ulcer of other part of left foot limited to breakdown of skin ([place])    Esophageal dysphagia    Bacteremia    Endocarditis of mitral valve    Fever 04/28/2020   Cervical lymphadenopathy 03/15/2020   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   ICD (implantable cardioverter-defibrillator) infection ([place]) 12/22/2019   Gram-positive bacteremia 11/27/2019   FUO (fever of unknown origin) 11/26/2019   Low blood pressure reading 11/05/2019   Wound dehiscence 10/22/2019   PFO (patent foramen ovale) 10/06/2019   ICD (implantable cardioverter-defibrillator) battery depletion    Pressure injury of skin 09/13/2019   Enterococcal bacteremia history  09/13/2019   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   Urinary retention 09/13/2019   Phimosis 09/13/2019   SIRS (systemic inflammatory response syndrome) ([place]) 09/12/2019   Sepsis ([place]) 09/12/2019   Enterococcus UTI 09/12/2019   Cellulitis of foot    History of transmetatarsal amputation of left foot ([place])    Subacute osteomyelitis, left ankle and foot ([place])    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  ([place]) 04/17/2018   Diabetic neuropathy, painful ([place]) 12/26/2016   Neuropathic pain of foot, left 06/20/2016   Insulin resistance 07/22/2015   Status post amputation of left great toe ([place]) 06/08/2015   Spinal stenosis of lumbar region 02/16/2015   Hereditary and idiopathic peripheral neuropathy 01/12/2015   Oral thrush 12/28/2014   Orthostatic dizziness 05/24/2014   Paresthesias with subjective weakness 01/30/2014   Bilateral thigh pain 01/30/2014   Abdominal wall pain in right upper quadrant 01/22/2014   Gross hematuria 09/25/2013   Intertrigo 09/25/2013   IBS (irritable bowel syndrome) 09/11/2013   Chronic pain syndrome 08/06/2013   CAD (coronary artery disease), native coronary artery 12/25/2012   Paroxysmal atrial fibrillation ([place]) 12/25/2012   Moderate aortic stenosis 12/25/2012   Aortic stenosis 12/24/2012  Personal history of colonic adenoma 10/30/2012   Diverticulosis of colon (without mention of hemorrhage) 10/30/2012   Internal hemorrhoids 10/30/2012   Change in bowel habits intermittent loose stools 10/30/2012   Routine health maintenance 06/09/2012   Hereditary peripheral neuropathy 10/10/2011   Long term current use of anticoagulant - apixiban 08/16/2010   Dyspnea on exertion 09/08/2009   HYPERCHOLESTEROLEMIA-PURE 01/13/2009   Essential hypertension, benign 01/13/2009   EDEMA 01/13/2009   GOUT 01/12/2009   GLAUCOMA 01/12/2009   Venous insufficiency (chronic) (peripheral) 01/12/2009   COPD (chronic obstructive pulmonary disease) ([place]) 01/12/2009   VENTRAL HERNIA 01/12/2009   OSTEOARTHRITIS 01/12/2009   ARTHRITIS 01/12/2009   DEGENERATIVE DISC DISEASE 01/12/2009   CATARACT, HX OF 01/12/2009   HEART MURMUR, HX OF 01/12/2009   BENIGN PROSTATIC HYPERTROPHY, HX OF 01/12/2009   Obesity 01/17/2007   OBSTRUCTIVE SLEEP APNEA 01/17/2007   ASTHMA 01/17/2007   GERD 01/17/2007   HALLUX VALGUS, ACQUIRED 01/17/2007   Pulmonary hypertension ([place]) 01/17/2007   Past  Medical History:  Diagnosis Date   AICD (automatic cardioverter/defibrillator) present    Balanitis    +severe phimosis+ buried penis->circ not possible but dorsal slit done 10/2019   BPH (benign prostatic hypertrophy)    with urinary retention.  Renal u/s 12/04/19 NORMAL KIDNEYS, NORMAL BLADDER, NO HYDRONEPHROSIS   CAD (coronary artery disease)    Nonobstructive by 12/2012 cath; then 03/2016 he required BMS to RCA ([facility]).  In-stent restenosis on cath 11/01/16, baloon angioplasty successful.   CATARACT, HX OF    Chronic combined systolic and diastolic CHF (congestive heart failure) ([place]) 05/2016   Ischemic CM.  04/2018 EF 40-45%, grd I DD.  07/2021 EF 55-60%, AV well seated   Chronic pain syndrome    Lumbar DDD; chronic neuropathic pain (DM); has spinal stimulator and sees pain mgmt MD   Complicated UTI (urinary tract infection) 09/2019   Phimoses, acute urinary retention, entoerococcus UTI, enterococc bacteremia.  F/u blood clx's neg x 5d.     COPD    Debilitated patient    Diabetes mellitus type 2 with complications ([place])    ZOX0R jumped from 5.7% to 6.1% 03/2015---started metformin at that time.  DM 2 dx by fasting gluc criteria 2018.  Has chronic neuropath pain   Enterococcal bacteremia 09/2019   Phimoses, acute urinary retention, entoerococcus UTI, enterococc bacteremia.  F/u blood clx's neg x 5d.     Essential hypertension, benign    Fatty liver 2007   2007 u/s showed fatty liver with hepatosplenomegaly.  2019 repeat u/s->fatty liver but no cirrhosis or hepatosplenomegaly.   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 Tommy Medal)    Lumbosacral neuritis    Lumbosacral spondylosis    Lumbar spinal stenosis with neurogenic claudication--contributes to his chronic pain syndrome   Morbid obesity ([place])    Normal memory function 08/2014   Neuropsychological testing ([facility]): no cognitive impairment or sign of neurodegenerative disorder.  Likely has adjustment d/o with mixed anxiety/depressed features and may benefit from low dose SNRI.     Normocytic anemia 03/2016   Mild-pt needs ferritin and vit B12 level checked (as of  03/22/16). Hb stable 09/2019   NSTEMI (non-ST elevated myocardial infarction) ([place]) 03/20/2016   BMS to RCA   Obesity hypoventilation syndrome (HCC)    Orthostatic hypotension    OSA on CPAP    8 cm H2O   OSTEOARTHRITIS    Paroxysmal atrial fibrillation ([place]) 2003    (? chronic?) Off anticoag for a while due to falls.  Then apixaban started 12/2014.   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 Ella Bodo): neuropathic pain->diff to treat, failed nucynta, failed spinal stimul trial, oxycontin hs + tramadol + gabap as of 12/2017 f/u Dr. Letta Pate.   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   Sick sinus syndrome (HCC)    PPM placed   Thrombocytopenia ([place]) 2018   HSM on 2007 abd  u/s---suspect some mild splenic sequestration chronically.   Unspecified glaucoma(365.9)    Unspecified hereditary and idiopathic peripheral neuropathy approx age 26   bilat LE's, ? left arm, too.  Feet became progressively numb + left foot pain intermittently.  Pt may be trying a spinal stimulator (as of 05/2015)   Vaccine counseling 11/16/2021   VENOUS INSUFFICIENCY    Being followed by Dr. Sharol Given as of 10/2016 for two R LL venous stasis ulcers/skin tears.  Healed as of 10/30/16 f/u with Dr. Sharol Given.   Venous stasis dermatitis 12/27/2021   VENTRAL HERNIA     Family History  Problem Relation Age of Onset   Hypertension Mother    Coronary artery disease Mother    Heart attack Mother    Neuropathy Mother    Pulmonary fibrosis Father        asbestosis    Past Surgical History:  Procedure Laterality Date   AMPUTATION Left 04/11/2013   Procedure: AMPUTATION DIGIT Left 3rd toe;  Surgeon: [name], MD;  Location: [facility];  Service: Orthopedics;  Laterality: Left;  Left 3rd toe amputation at [facility]   AMPUTATION Left 08/29/2019   Procedure: LEFT TRANSMETATARSAL AMPUTATION;  Surgeon: [name], MD;  Location: [facility];  Service: Orthopedics;  Laterality: Left;   BIOPSY  05/04/2020   Procedure: BIOPSY;  Surgeon: Jackquline Denmark, MD;  Location: [name] ENDOSCOPY;  Service: Endoscopy;;   [place]; 03/10/16   1997 Non-obstructive disease.  03/2016 BMS to RCA, with 25% pDiag dz, o/w normal cors per cath 03/07/16.  Cath 11/01/16: in stent restenosis, successful baloon angioplasty. 08/2020 branch vessel dz, cont medical therapy rec'd.   CARDIAC CATHETERIZATION  12/24/2012   mild < 20% LCx, prox 30% RCA; LVEF 55-65% , moderate pulmonary HTN, moderate AS   CARDIAC DEFIBRILLATOR PLACEMENT  11/07/2017   [facility] MRI Quad CRT defibrillator   CARDIOVASCULAR STRESS TEST  05/11/16 ([facility])   2017 Myocardial perfusion imaging:  No ischemia; scar in  apex, global hypokinesis, EF 36%.  06/15/20->mod primarily  fixed inferolat wall defect mildly worse with stress c/w infarct/scar with mild peri-infarct ischemia, normal EF-->for cath per cards.   Carotid dopplers  03/09/2016   [facility]: no hemodynamically significant stenosis on either side.   CHOLECYSTECTOMY     COLONOSCOPY [name] 10/30/2012   Procedure: COLONOSCOPY;  Surgeon: [name], MD;  Location: [facility] ENDOSCOPY;  Service: Endoscopy;  Laterality: [name];   CORONARY ANGIOPLASTY WITH STENT PLACEMENT  03/2016; 04/2017   2017-[facility]: BMS to RCA-pt was placed on Brilinta.  04/2017: DES to RCA.   DORSAL SLIT [name] 10/29/2019   for severe phimosis. Procedure: DORSAL SLIT;  Surgeon: Alexis Frock, MD;  Location: [facility] ORS;  Service: Urology;  Laterality: [name];  [place]  05/04/2020   Procedure: ESOPHAGEAL DILATION;  Surgeon: Jackquline Denmark, MD;  Location: [facility] ENDOSCOPY;  Service: Endoscopy;;   ESOPHAGOGASTRODUODENOSCOPY (EGD) WITH PROPOFOL [name] 05/04/2020   Procedure: ESOPHAGOGASTRODUODENOSCOPY (EGD) WITH PROPOFOL;  Surgeon: Jackquline Denmark, MD;  Location[name] END[facility]COPY;  Service: Endoscopy;  Laterality: [name];   EYE SURGERY Bilateral cataract   HEMORRHOID SURGERY     INTRAOCULAR LENS INSERTION Bilateral    KNEE SURGERY Right    LEFT AND RIGHT HEART CATHETERIZATION WITH CORONARY ANGIOGRAM [name] 12/24/2012   Procedure: LEFT AND RIGHT HEART CATHETERIZATION WITH CORONARY ANGIOGRAM;  Surgeon: [name], MD;  Location: [facility] CATH LAB;  Service: Cardiovascular;  Laterality: [name];   LEG SURGERY Bilateral    lenghtening    PACEMAKER PLACEMENT  04/13/2016   2nd deg HB after TAVR, pt had DC MDT PPM placed.   SHOULDER ARTHROSCOPY  08/30/2011   Procedure: ARTHROSCOPY SHOULDER;  Surgeon: [name], MD;  Location: [facility];  Service: Orthopedics;  Laterality: Right;  Right Shoulder Arthroscopy, Debridement, and Decompression   SPINAL CORD STIMULATOR INSERTION [name] 09/10/2015   Procedure: LUMBAR SPINAL CORD STIMULATOR INSERTION;  Surgeon: [name], MD;   Location: [facility]URO ORS;  Service: Neurosurgery;  Laterality: [name];   TEE WITHOUT CARDIOVERSION [name] 09/16/2019   Procedure: TRANSESOPHAGEAL ECHOCARDIOGRAM (TEE);  Surgeon: Skeet Latch, MD;  Location: [facility] ENDOSCOPY;  Service: Cardiovascular;  Laterality: [name];   TEE WITHOUT CARDIOVERSION [name] 12/02/2019   +vegetation on AVR and pacer lead in RV.  EF 55-60%, normal wall motion.  Valves function normal.  Procedure: TRANSESOPHAGEAL ECHOCARDIOGRAM (TEE);  Surgeon: Geralynn Rile, MD;  Location: [facility];  Service: Cardiovascular;  Laterality: [name];   TOE AMPUTATION Left    due to osteomyelitis.  R big toe surg due to osteoarth   TONSILLECTOMY     traeculectomy Left    eye   TRANSCATHETER AORTIC VALVE REPLACEMENT, TRANSFEMORAL  04/11/2016   TRANSESOPHAGEAL ECHOCARDIOGRAM  03/09/2016; 09/2019   [facility]: EF 55-60%, PFO seen with bi-directional shunting, no thrombus in appendage.  09/2019 ->no valvular vegetations. Small patent foramen ovale with predominantly left to right shunting across the interatrial septum.   TRANSTHORACIC ECHOCARDIOGRAM  01/2015; 01/2016; 05/18/16; 09/18/16, 05/2017, 08/2017   01/2015 No signif change in aortic stenosis (moderate).  01/2016 Severe LVH w/small LV cavity, EF 60-65%, grade I diast dysfxn.  05/2016 (s/p TAVR): EF 50-55%, grd I DD, biopros AV good.  08/2016--EF 50-55%, LV septal motion c/w conduction abnl, grd I DD,mild MS,bioprosth aortic valve well seated, w/trace AR. 05/2017 TTE EF 35%. 08/2017-EF 35%, mod diff hypokin LV, grd I DD, biopros AV good.    TRANSTHORACIC ECHOCARDIOGRAM  04/2018; 09/2019   04/2018: EF 40-45%, mod diffuse LV hypokin,  grd I DD, bioprosth AV well seated, no AS or AR. 09/2019 EF 60-65%, grd I DD, valves fine, including bioprosth AV. 04/29/20 (tech diff) EF 55-60%, grd I DD, vegetation on MV.  07/2021 EF 55-60% (s/p upgrade to BiV PPM), AV well seated.   VITRECTOMY     Social History   Occupational History   Occupation: Engineer, production    Comment:  retired  Tobacco Use   Smoking status: Never   Smokeless tobacco: Never  Scientific laboratory technician Use: Never used  Substance and Sexual Activity   Alcohol use: No    Alcohol/week: 0.0 standard drinks of alcohol   Drug use: No    Types: Oxycodone   Sexual activity: Not Currently

## 2021-12-28 NOTE — Telephone Encounter (Signed)
Spoke with pt and confirmed instructions were correct.

## 2021-12-28 NOTE — Telephone Encounter (Signed)
Chart reviewed. He saw infectious disease MD yesterday and was instructed to stop the linezolid and restart his amoxicillin.  Call and confirm with patient.  Thanks.

## 2022-01-02 ENCOUNTER — Ambulatory Visit: Payer: Medicare Other | Admitting: Orthopedic Surgery

## 2022-01-05 ENCOUNTER — Ambulatory Visit (INDEPENDENT_AMBULATORY_CARE_PROVIDER_SITE_OTHER): Payer: Medicare Other | Admitting: Orthopedic Surgery

## 2022-01-05 DIAGNOSIS — Z89432 Acquired absence of left foot: Secondary | ICD-10-CM

## 2022-01-05 DIAGNOSIS — L97521 Non-pressure chronic ulcer of other part of left foot limited to breakdown of skin: Secondary | ICD-10-CM

## 2022-01-05 DIAGNOSIS — I87332 Chronic venous hypertension (idiopathic) with ulcer and inflammation of left lower extremity: Secondary | ICD-10-CM

## 2022-01-16 ENCOUNTER — Encounter: Payer: Self-pay | Admitting: Orthopedic Surgery

## 2022-01-16 NOTE — Progress Notes (Signed)
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  Patient is currently on amoxicillin for history of chronic infections.  Assessment & Plan: Visit Diagnoses:  1. History of transmetatarsal amputation of left foot ([place])   2. Non-pressure chronic ulcer of other part of left foot limited to breakdown of skin ([place])   3. Chronic venous hypertension (idiopathic) with ulcer and inflammation of left lower extremity (HCC)     Plan: Ulcer was debrided on left foot.  Patient will resume wearing compression socks.  Follow-Up Instructions: Return in about 2 weeks (around 01/19/2022).   Ortho Exam  [name], oriented, no adenopathy, well-dressed, normal affect, normal respiratory effort. Examination the venous stasis ulcers have healed there is no redness cellulitis or drainage.  Examination the left transmetatarsal amputation there is a Wagner grade 1 ulcer.  After informed consent a 10 blade knife was used to debride the skin and soft tissue back to healthy viable tissue.  After debridement the ulcer was 2 cm in diameter and 3 mm deep.  The ulcer was 1 cm diameter prior to debridement.  Imaging: No results found. No images are attached to the encounter.  Labs: Lab Results  Component Value Date   HGBA1C 6.1 10/13/2021   HGBA1C 6.2 05/11/2021   HGBA1C 6.0 02/09/2021   ESRSEDRATE 30 (H) 12/15/2021   ESRSEDRATE 34 (H) 11/24/2020   ESRSEDRATE 38 (H) 07/14/2020   CRP 1.3 11/24/2020   CRP 0.9 07/14/2020   CRP 0.7 05/03/2020   LABURIC 3.5 (L)  09/10/2013   REPTSTATUS 12/15/2021 FINAL 12/14/2021   CULT  12/14/2021    NO GROWTH Performed at [facility], [place] [address], [place], [place] [address]    LABORGA ENTEROCOCCUS FAECALIS 09/11/2019     Lab Results  Component Value Date   ALBUMIN 2.8 (L) 12/15/2021   ALBUMIN 3.2 (L) 12/14/2021   ALBUMIN 3.4 (L) 10/13/2021    Lab Results  Component Value Date   MG 1.7 05/07/2020   MG 1.6 (L) 05/06/2020   MG 1.8 05/05/2020   No results found for: "VD25OH"  No results found for: "PREALBUMIN"    Latest Ref Rng & Units 12/27/2021    9:35 AM 12/20/2021   12:40 PM 12/15/2021    3:43 AM  CBC EXTENDED  WBC 3.8 - 10.8 Thousand/uL 4.7  4.9  5.1   RBC 4.20 - 5.80 Million/uL 3.52  3.72  3.53   Hemoglobin 13.2 - 17.1 g/dL 11.0  11.7  10.6   HCT 38.5 - 50.0 % 32.9  33.7  32.3   Platelets 140 - 400 Thousand/uL 76  121.0  110   NEUT# 1,500 - 7,800 cells/uL 2,355  2.9    Lymph# 850 - 3,900 cells/uL 1,612  1.3       There is no height or weight on file to calculate BMI.  Orders:  No orders of the defined types were placed in this encounter.  No orders of the defined types were placed in this encounter.    Procedures: No procedures performed  Clinical Data: No additional findings.  ROS:  All other systems negative, except as noted in the HPI. Review of Systems  Objective: Vital Signs: There were no vitals taken for this visit.  Specialty Comments:  No specialty comments available.  PMFS History: Patient Active Problem List   Diagnosis Date Noted   Venous stasis dermatitis 12/27/2021   Cellulitis 12/14/2021   Wound of right lower extremity 12/14/2021   Transient hypotension 12/14/2021   Heart failure with preserved ejection fraction ([place]) 12/14/2021   Prediabetes 12/14/2021   Thrombocytopenia ([place]) 12/14/2021   Obesity, Class III, BMI 40-49.9 (morbid obesity) ([place]) 12/14/2021   BPH (benign prostatic hyperplasia) 12/14/2021   Vaccine counseling 11/16/2021    Weakness 02/23/2021   Physical deconditioning 02/23/2021   Rhinitis 07/14/2020   Mitral valve vegetation 05/28/2020   Non-pressure chronic ulcer of other part of left foot limited to breakdown of skin ([place])    Esophageal dysphagia    Bacteremia    Endocarditis of mitral valve    Fever 04/28/2020   Cervical lymphadenopathy 03/15/2020   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   ICD (implantable cardioverter-defibrillator) infection ([place]) 12/22/2019   Gram-positive bacteremia 11/27/2019   FUO (fever of unknown origin) 11/26/2019   Low blood pressure reading 11/05/2019   Wound dehiscence 10/22/2019   PFO (patent foramen ovale) 10/06/2019   ICD (implantable cardioverter-defibrillator) battery depletion    Pressure injury of skin 09/13/2019   Enterococcal bacteremia history  09/13/2019   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   Urinary retention 09/13/2019   Phimosis 09/13/2019   SIRS (systemic inflammatory response syndrome) ([place]) 09/12/2019   Sepsis ([place]) 09/12/2019   Enterococcus UTI 09/12/2019   Cellulitis of foot    History of transmetatarsal amputation of left foot ([place])    Subacute osteomyelitis, left ankle and foot ([place])    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 ([place]) 04/17/2018   Diabetic neuropathy, painful ([place]) 12/26/2016   Neuropathic pain of foot, left 06/20/2016   Insulin resistance 07/22/2015   Status post amputation of left great toe ([place]) 06/08/2015   Spinal stenosis of lumbar region 02/16/2015   Hereditary and idiopathic peripheral neuropathy 01/12/2015   Oral thrush 12/28/2014   Orthostatic dizziness 05/24/2014   Paresthesias with subjective weakness 01/30/2014   Bilateral thigh pain 01/30/2014   Abdominal wall pain in right upper quadrant 01/22/2014   Gross hematuria 09/25/2013   Intertrigo 09/25/2013   IBS  (irritable bowel syndrome) 09/11/2013   Chronic pain syndrome 08/06/2013   CAD (coronary artery disease), native coronary artery 12/25/2012   Paroxysmal atrial fibrillation ([place]) 12/25/2012   Moderate aortic stenosis 12/25/2012   Aortic stenosis 12/24/2012   Personal history of colonic adenoma 10/30/2012   Diverticulosis of colon (without mention of hemorrhage) 10/30/2012   Internal hemorrhoids 10/30/2012   Change in bowel habits intermittent loose stools 10/30/2012   Routine health maintenance 06/09/2012   Hereditary peripheral neuropathy 10/10/2011   Long term current use of anticoagulant - apixiban 08/16/2010   Dyspnea on exertion 09/08/2009   HYPERCHOLESTEROLEMIA-PURE 01/13/2009   Essential hypertension, benign 01/13/2009   EDEMA 01/13/2009   GOUT 01/12/2009   GLAUCOMA 01/12/2009   Venous insufficiency (chronic) (peripheral) 01/12/2009   COPD (chronic obstructive pulmonary disease) ([place]) 01/12/2009  VENTRAL HERNIA 01/12/2009   OSTEOARTHRITIS 01/12/2009   ARTHRITIS 01/12/2009   DEGENERATIVE DISC DISEASE 01/12/2009   CATARACT, HX OF 01/12/2009   HEART MURMUR, HX OF 01/12/2009   BENIGN PROSTATIC HYPERTROPHY, HX OF 01/12/2009   Obesity 01/17/2007   OBSTRUCTIVE SLEEP APNEA 01/17/2007   ASTHMA 01/17/2007   GERD 01/17/2007   HALLUX VALGUS, ACQUIRED 01/17/2007   Pulmonary hypertension ([place]) 01/17/2007   Past Medical History:  Diagnosis Date   AICD (automatic cardioverter/defibrillator) present    Balanitis    +severe phimosis+ buried penis->circ not possible but dorsal slit done 10/2019   BPH (benign prostatic hypertrophy)    with urinary retention.  Renal u/s 12/04/19 NORMAL KIDNEYS, NORMAL BLADDER, NO HYDRONEPHROSIS   CAD (coronary artery disease)    Nonobstructive by 12/2012 cath; then 03/2016 he required BMS to RCA ([facility]).  In-stent restenosis on cath 11/01/16, baloon angioplasty successful.   CATARACT, HX OF    Chronic combined systolic and diastolic CHF (congestive heart  failure) ([place]) 05/2016   Ischemic CM.  04/2018 EF 40-45%, grd I DD.  07/2021 EF 55-60%, AV well seated   Chronic pain syndrome    Lumbar DDD; chronic neuropathic pain (DM); has spinal stimulator and sees pain mgmt MD   Complicated UTI (urinary tract infection) 09/2019   Phimoses, acute urinary retention, entoerococcus UTI, enterococc bacteremia.  F/u blood clx's neg x 5d.     COPD    Debilitated patient    Diabetes mellitus type 2 with complications ([place])    POE4M jumped from 5.7% to 6.1% 03/2015---started metformin at that time.  DM 2 dx by fasting gluc criteria 2018.  Has chronic neuropath pain   Enterococcal bacteremia 09/2019   Phimoses, acute urinary retention, entoerococcus UTI, enterococc bacteremia.  F/u blood clx's neg x 5d.     Essential hypertension, benign    Fatty liver 2007   2007 u/s showed fatty liver with hepatosplenomegaly.  2019 repeat u/s->fatty liver but no cirrhosis or hepatosplenomegaly.   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 Tommy Medal)   Lumbosacral neuritis    Lumbosacral spondylosis    Lumbar spinal stenosis with neurogenic claudication--contributes to his chronic pain syndrome   Morbid obesity ([place])    Normal memory function 08/2014   Neuropsychological testing ([facility]): no cognitive impairment or sign of neurodegenerative disorder.  Likely has adjustment d/o with mixed anxiety/depressed features and may benefit from low dose SNRI.     Normocytic anemia 03/2016   Mild-pt needs ferritin and vit B12 level checked (as of 03/22/16). Hb stable 09/2019   NSTEMI (non-ST elevated myocardial  infarction) ([place]) 03/20/2016   BMS to RCA   Obesity hypoventilation syndrome (HCC)    Orthostatic hypotension    OSA on CPAP    8 cm H2O   OSTEOARTHRITIS    Paroxysmal atrial fibrillation ([place]) 2003    (? chronic?) Off anticoag for a while due to falls.  Then apixaban started 12/2014.   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 Ella Bodo): neuropathic pain->diff to treat, failed nucynta, failed spinal stimul trial, oxycontin hs + tramadol + gabap  as of 12/2017 f/u Dr. Letta Pate.   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   Sick sinus syndrome (HCC)    PPM placed   Thrombocytopenia ([place]) 2018   HSM on 2007 abd u/s---suspect some mild splenic sequestration chronically.   Unspecified glaucoma(365.9)    Unspecified hereditary and idiopathic peripheral neuropathy approx age 30   bilat LE's, ? left arm, too.  Feet became progressively numb + left foot pain intermittently.  Pt may be trying a spinal stimulator (as of 05/2015)   Vaccine counseling 11/16/2021   VENOUS INSUFFICIENCY    Being followed by Dr. Sharol Given as of 10/2016 for two R LL venous stasis ulcers/skin tears.  Healed as of 10/30/16 f/u with Dr. Sharol Given.   Venous stasis dermatitis 12/27/2021   VENTRAL HERNIA     Family History   Problem Relation Age of Onset   Hypertension Mother    Coronary artery disease Mother    Heart attack Mother    Neuropathy Mother    Pulmonary fibrosis Father        asbestosis    Past Surgical History:  Procedure Laterality Date   AMPUTATION Left 04/11/2013   Procedure: AMPUTATION DIGIT Left 3rd toe;  Surgeon: [name], MD;  Location: [facility];  Service: Orthopedics;  Laterality: Left;  Left 3rd toe amputation at [facility]   AMPUTATION Left 08/29/2019   Procedure: LEFT TRANSMETATARSAL AMPUTATION;  Surgeon: [name], MD;  Location: [facility];  Service: Orthopedics;  Laterality: Left;   BIOPSY  05/04/2020   Procedure: BIOPSY;  Surgeon: Jackquline Denmark, MD;  Location: [facility] ENDOSCOPY;  Service: Endoscopy;;   [place]; 03/10/16   1997 Non-obstructive disease.  03/2016 BMS to RCA, with 25% pDiag dz, o/w normal cors per cath 03/07/16.  Cath 11/01/16: in stent restenosis, successful baloon angioplasty. 08/2020 branch vessel dz, cont medical therapy rec'd.   CARDIAC CATHETERIZATION  12/24/2012   mild < 20% LCx, prox 30% RCA; LVEF 55-65% , moderate pulmonary HTN, moderate AS   CARDIAC DEFIBRILLATOR PLACEMENT  11/07/2017   [facility] MRI Quad CRT defibrillator   CARDIOVASCULAR STRESS TEST  05/11/16 ([facility])   2017 Myocardial perfusion imaging:  No ischemia; scar in apex, global hypokinesis, EF 36%.  06/15/20->mod primarily fixed inferolat wall defect mildly worse with stress c/w infarct/scar with mild peri-infarct ischemia, normal EF-->for cath per cards.   Carotid dopplers  03/09/2016   [facility]: no hemodynamically significant stenosis on either side.   CHOLECYSTECTOMY     COLONOSCOPY [name] 10/30/2012   Procedure: COLONOSCOPY;  Surgeon: [name], MD;  Location: [facility] ENDOSCOPY;  Service: Endoscopy;  Laterality: [name];   CORONARY ANGIOPLASTY WITH STENT PLACEMENT  03/2016; 04/2017   2017-[facility]: BMS to RCA-pt was placed on Brilinta.  04/2017: DES to RCA.  DORSAL SLIT [name] 10/29/2019   for  severe phimosis. Procedure: DORSAL SLIT;  Surgeon: Alexis Frock, MD;  Location: [facility] ORS;  Service: Urology;  Laterality: [name];  [place]  05/04/2020   Procedure: ESOPHAGEAL DILATION;  Surgeon: Jackquline Denmark, MD;  Location[facility] ENDOSCOPY;  Service: Endoscopy;;   ESOPHAGOGASTRODUODENOSCOPY (EGD) WITH PROPOFOL [name] 05/04/2020   Procedure: ESOPHAGOGASTRODUODENOSCOPY (EGD) WITH PROPOFOL;  Surgeon: Jackquline Denmark, MD;  Location: [facility] ENDOSCOPY;  Service: Endoscopy;  Laterality: [name];   EYE SURGERY Bilateral cataract   HEMORRHOID SURGERY     INTRAOCULAR LENS INSERTION Bilateral    KNEE SURGERY Right    LEFT AND RIGHT HEART CATHETERIZATION WITH CORONARY ANGIOGRAM [name] 12/24/2012   Procedure: LEFT AND RIGHT HEART CATHETERIZATION WITH CORONARY ANGIOGRAM;  Surgeon: [name], MD;  Location: [facility] CATH LAB;  Service: Cardiovascular;  Laterality: [name];   LEG SURGERY Bilateral    lenghtening    PACEMAKER PLACEMENT  04/13/2016   2nd deg HB after TAVR, pt had DC MDT PPM placed.   SHOULDER ARTHROSCOPY  08/30/2011   Procedure: ARTHROSCOPY SHOULDER;  Surgeon: [name], MD;  Location: [facility];  Service: Orthopedics;  Laterality: Right;  Right Shoulder Arthroscopy, Debridement, and Decompression   SPINAL CORD STIMULATOR INSERTION [name] 09/10/2015   Procedure: LUMBAR SPINAL CORD STIMULATOR INSERTION;  Surgeon: [name], MD;  Location: [facility]URO ORS;  Service: Neurosurgery;  Laterality: [name];   TEE WITHOUT CARDIOVERSION [name] 09/16/2019   Procedure: TRANSESOPHAGEAL ECHOCARDIOGRAM (TEE);  Surgeon: Skeet Latch, MD;  Location: [facility] ENDOSCOPY;  Service: Cardiovascular;  Laterality: [name];   TEE WITHOUT CARDIOVERSION [name] 12/02/2019   +vegetation on AVR and pacer lead in RV.  EF 55-60%, normal wall motion.  Valves function normal.  Procedure: TRANSESOPHAGEAL ECHOCARDIOGRAM (TEE);  Surgeon: Geralynn Rile, MD;  Location: [employer];  Service: Cardiovascular;  Laterality: [name];   TOE AMPUTATION  Left    due to osteomyelitis.  R big toe surg due to osteoarth   TONSILLECTOMY     traeculectomy Left    eye   TRANSCATHETER AORTIC VALVE REPLACEMENT, TRANSFEMORAL  04/11/2016   TRANSESOPHAGEAL ECHOCARDIOGRAM  03/09/2016; 09/2019   [facility]: EF 55-60%, PFO seen with bi-directional shunting, no thrombus in appendage.  09/2019 ->no valvular vegetations. Small patent foramen ovale with predominantly left to right shunting across the interatrial septum.   TRANSTHORACIC ECHOCARDIOGRAM  01/2015; 01/2016; 05/18/16; 09/18/16, 05/2017, 08/2017   01/2015 No signif change in aortic stenosis (moderate).  01/2016 Severe LVH w/small LV cavity, EF 60-65%, grade I diast dysfxn.  05/2016 (s/p TAVR): EF 50-55%, grd I DD, biopros AV good.  08/2016--EF 50-55%, LV septal motion c/w conduction abnl, grd I DD,mild MS,bioprosth aortic valve well seated, w/trace AR. 05/2017 TTE EF 35%. 08/2017-EF 35%, mod diff hypokin LV, grd I DD, biopros AV good.    TRANSTHORACIC ECHOCARDIOGRAM  04/2018; 09/2019   04/2018: EF 40-45%, mod diffuse LV hypokin, grd I DD, bioprosth AV well seated, no AS or AR. 09/2019 EF 60-65%, grd I DD, valves fine, including bioprosth AV. 04/29/20 (tech diff) EF 55-60%, grd I DD, vegetation on MV.  07/2021 EF 55-60% (s/p upgrade to BiV PPM), AV well seated.   VITRECTOMY     Social History   Occupational History   Occupation: Engineer, production    Comment: retired  Tobacco Use   Smoking status: Never   Smokeless tobacco: Never  Vaping Use   Vaping Use: Never used  Substance and Sexual Activity   Alcohol use: No  Alcohol/week: 0.0 standard drinks of alcohol   Drug use: No    Types: Oxycodone   Sexual activity: Not Currently

## 2022-01-17 ENCOUNTER — Telehealth: Payer: Self-pay

## 2022-01-17 NOTE — Telephone Encounter (Signed)
Spoke with pt to schedule AWV in office. Patient declined to schedule wellness visit at this time.

## 2022-01-20 ENCOUNTER — Ambulatory Visit: Payer: Medicare Other | Admitting: Family

## 2022-01-26 ENCOUNTER — Other Ambulatory Visit: Payer: Self-pay | Admitting: Family Medicine

## 2022-01-27 ENCOUNTER — Ambulatory Visit (INDEPENDENT_AMBULATORY_CARE_PROVIDER_SITE_OTHER): Payer: Medicare Other | Admitting: Family

## 2022-01-27 DIAGNOSIS — Z89432 Acquired absence of left foot: Secondary | ICD-10-CM | POA: Diagnosis not present

## 2022-01-27 DIAGNOSIS — L97521 Non-pressure chronic ulcer of other part of left foot limited to breakdown of skin: Secondary | ICD-10-CM | POA: Diagnosis not present

## 2022-01-27 DIAGNOSIS — M86272 Subacute osteomyelitis, left ankle and foot: Secondary | ICD-10-CM

## 2022-02-01 ENCOUNTER — Encounter: Payer: Self-pay | Admitting: Family

## 2022-02-01 NOTE — Progress Notes (Signed)
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  He has noticed increased bloody drainage  Also seen in follow-up for some worsening of his edema with cellulitis of the right lower extremity he feels that the erythema has been slow to improve  Assessment & Plan: Visit Diagnoses:  1. Subacute osteomyelitis, left ankle and foot ([place])   2. Non-pressure chronic ulcer of other part of left foot limited to breakdown of skin ([place])   3. History of transmetatarsal amputation of left foot ([place])     Plan: Reassurance provided of the right lower extremity.  Resolving cellulitis continue with compression and daily wound care.  Follow-Up Instructions: Return in about 2 weeks (around 02/10/2022).   Ortho Exam  [name], oriented, no adenopathy, well-dressed, normal affect, normal respiratory effort. On examination of the right lower extremity there is significant 2+ pitting edema with erythema this is resolving cellulitis.  There is hemosiderin staining as well with dry flaking skin.  On examination of the left lower extremity there is again hemosiderin staining with mild erythema, this is chronic for him.  To the transmetatarsal amputation on the left he does have central ulceration with some  hyperkeratotic tissue this was debrided with a 10 blade knife back to viable tissue there is underlying ulcer this is 5 mm in diameter 2 mm deep this does not probe to bone or tendon  Imaging: No results found. No images are attached to the encounter.  Labs: Lab Results  Component Value Date   HGBA1C 6.1 10/13/2021   HGBA1C 6.2 05/11/2021   HGBA1C 6.0 02/09/2021   ESRSEDRATE 30 (H) 12/15/2021   ESRSEDRATE 34 (H) 11/24/2020   ESRSEDRATE 38 (H) 07/14/2020   CRP 1.3 11/24/2020   CRP 0.9 07/14/2020   CRP 0.7 05/03/2020   LABURIC 3.5 (L) 09/10/2013   REPTSTATUS 12/15/2021 FINAL 12/14/2021   CULT  12/14/2021    NO GROWTH Performed at [facility], [place] [address] [place], [place] [address]    LABORGA ENTEROCOCCUS FAECALIS 09/11/2019     Lab Results  Component Value Date   ALBUMIN 2.8 (L) 12/15/2021   ALBUMIN 3.2 (L) 12/14/2021   ALBUMIN 3.4 (L) 10/13/2021    Lab Results  Component Value Date   MG 1.7 05/07/2020   MG 1.6 (L) 05/06/2020   MG 1.8 05/05/2020   No results found for: "VD25OH"  No results found for: "PREALBUMIN"    Latest Ref Rng & Units 12/27/2021    9:35 AM 12/20/2021   12:40 PM 12/15/2021    3:43 AM  CBC EXTENDED  WBC 3.8 - 10.8 Thousand/uL 4.7  4.9  5.1   RBC 4.20 - 5.80 Million/uL 3.52  3.72  3.53   Hemoglobin 13.2 - 17.1 g/dL 11.0  11.7  10.6   HCT 38.5 - 50.0 % 32.9  33.7  32.3   Platelets 140 - 400 Thousand/uL 76  121.0  110   NEUT# 1,500 - 7,800 cells/uL 2,355  2.9    Lymph# 850 - 3,900 cells/uL 1,612  1.3       There is no height or weight on file to calculate BMI.  Orders:  No orders of the defined types were placed in this encounter.  No orders of the defined types were placed in this encounter.    Procedures: No procedures performed  Clinical Data: No additional findings.  ROS:  All other systems negative, except as noted in the HPI. Review of Systems  Objective: Vital Signs: There were no vitals taken for this  visit.  Specialty Comments:  No specialty comments available.  PMFS History: Patient Active Problem List   Diagnosis Date Noted   Venous stasis dermatitis 12/27/2021   Cellulitis 12/14/2021   Wound of right lower extremity 12/14/2021   Transient hypotension 12/14/2021   Heart failure with preserved ejection fraction ([place]) 12/14/2021   Prediabetes 12/14/2021   Thrombocytopenia ([place]) 12/14/2021   Obesity, Class III, BMI 40-49.9 (morbid obesity) ([place]) 12/14/2021   BPH (benign prostatic hyperplasia) 12/14/2021   Vaccine counseling 11/16/2021   Weakness 02/23/2021   Physical deconditioning 02/23/2021   Rhinitis 07/14/2020   Mitral valve vegetation 05/28/2020   Non-pressure chronic ulcer of other part of left foot limited to breakdown of skin ([place])    Esophageal dysphagia    Bacteremia    Endocarditis of mitral valve    Fever 04/28/2020   Cervical lymphadenopathy 03/15/2020   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   ICD (implantable cardioverter-defibrillator) infection ([place]) 12/22/2019   Gram-positive bacteremia 11/27/2019   FUO (fever of unknown origin) 11/26/2019   Low blood pressure reading 11/05/2019   Wound dehiscence 10/22/2019   PFO (patent foramen ovale) 10/06/2019   ICD (implantable cardioverter-defibrillator) battery depletion    Pressure injury of skin 09/13/2019   Enterococcal bacteremia history  09/13/2019   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   Urinary retention 09/13/2019   Phimosis 09/13/2019   SIRS (systemic inflammatory response syndrome) (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 ([place]) 04/17/2018    Diabetic neuropathy, painful ([place]) 12/26/2016   Neuropathic pain of foot, left 06/20/2016   Insulin resistance 07/22/2015   Status post amputation of left great toe ([place]) 06/08/2015   Spinal stenosis of lumbar region 02/16/2015   Hereditary and idiopathic peripheral neuropathy 01/12/2015   Oral thrush 12/28/2014   Orthostatic dizziness 05/24/2014   Paresthesias with subjective weakness 01/30/2014   Bilateral thigh pain 01/30/2014   Abdominal wall pain in right upper quadrant 01/22/2014   Gross hematuria 09/25/2013   Intertrigo 09/25/2013   IBS (irritable bowel syndrome) 09/11/2013   Chronic pain syndrome 08/06/2013   CAD (coronary artery disease), native coronary artery 12/25/2012   Paroxysmal atrial fibrillation ([place]) 12/25/2012   Moderate aortic stenosis 12/25/2012   Aortic stenosis 12/24/2012   Personal history of colonic adenoma 10/30/2012  Diverticulosis of colon (without mention of hemorrhage) 10/30/2012   Internal hemorrhoids 10/30/2012   Change in bowel habits intermittent loose stools 10/30/2012   Routine health maintenance 06/09/2012   Hereditary peripheral neuropathy 10/10/2011   Long term current use of anticoagulant - apixiban 08/16/2010   Dyspnea on exertion 09/08/2009   HYPERCHOLESTEROLEMIA-PURE 01/13/2009   Essential hypertension, benign 01/13/2009   EDEMA 01/13/2009   GOUT 01/12/2009   GLAUCOMA 01/12/2009   Venous insufficiency (chronic) (peripheral) 01/12/2009   COPD (chronic obstructive pulmonary disease) ([place]) 01/12/2009   VENTRAL HERNIA 01/12/2009   OSTEOARTHRITIS 01/12/2009   ARTHRITIS 01/12/2009   DEGENERATIVE DISC DISEASE 01/12/2009   CATARACT, HX OF 01/12/2009   HEART MURMUR, HX OF 01/12/2009   BENIGN PROSTATIC HYPERTROPHY, HX OF 01/12/2009   Obesity 01/17/2007   OBSTRUCTIVE SLEEP APNEA 01/17/2007   ASTHMA 01/17/2007   GERD 01/17/2007   HALLUX VALGUS, ACQUIRED 01/17/2007   Pulmonary hypertension ([place]) 01/17/2007   Past Medical History:   Diagnosis Date   AICD (automatic cardioverter/defibrillator) present    Balanitis    +severe phimosis+ buried penis->circ not possible but dorsal slit done 10/2019   BPH (benign prostatic hypertrophy)    with urinary retention.  Renal u/s 12/04/19 NORMAL KIDNEYS, NORMAL BLADDER, NO HYDRONEPHROSIS   CAD (coronary artery disease)    Nonobstructive by 12/2012 cath; then 03/2016 he required BMS to RCA ([facility]).  In-stent restenosis on cath 11/01/16, baloon angioplasty successful.   CATARACT, HX OF    Chronic combined systolic and diastolic CHF (congestive heart failure) ([place]) 05/2016   Ischemic CM.  04/2018 EF 40-45%, grd I DD.  07/2021 EF 55-60%, AV well seated   Chronic pain syndrome    Lumbar DDD; chronic neuropathic pain (DM); has spinal stimulator and sees pain mgmt MD   Complicated UTI (urinary tract infection) 09/2019   Phimoses, acute urinary retention, entoerococcus UTI, enterococc bacteremia.  F/u blood clx's neg x 5d.     COPD    Debilitated patient    Diabetes mellitus type 2 with complications ([place])    BBC4U jumped from 5.7% to 6.1% 03/2015---started metformin at that time.  DM 2 dx by fasting gluc criteria 2018.  Has chronic neuropath pain   Enterococcal bacteremia 09/2019   Phimoses, acute urinary retention, entoerococcus UTI, enterococc bacteremia.  F/u blood clx's neg x 5d.     Essential hypertension, benign    Fatty liver 2007   2007 u/s showed fatty liver with hepatosplenomegaly.  2019 repeat u/s->fatty liver but no cirrhosis or hepatosplenomegaly.   Generalized weakness    GERD (gastroesophageal reflux disease)    + hx of esoph stenosis, +dilation   Glaucoma    GOUT    HH (hiatus hernia)    HYPERCHOLESTEROLEMIA-PURE    Hypogonadism male    ICD (implantable cardioverter-defibrillator) infection ([place]) 12/22/2019   Infective endocarditis of aortic valve 12/2019   TAVR + RV pacer lead with vegetations->gram + cocci in chains, ?enterococcus (ID->Dr. Tommy Medal)   Lumbosacral  neuritis    Lumbosacral spondylosis    Lumbar spinal stenosis with neurogenic claudication--contributes to his chronic pain syndrome   Morbid obesity ([place])    Normal memory function 08/2014   Neuropsychological testing ([facility]): no cognitive impairment or sign of neurodegenerative disorder.  Likely has adjustment d/o with mixed anxiety/depressed features and may benefit from low dose SNRI.     Normocytic anemia 03/2016   Mild-pt needs ferritin and vit B12 level checked (as of 03/22/16). Hb stable 09/2019   NSTEMI (non-ST  elevated myocardial infarction) ([place]) 03/20/2016   BMS to RCA   Obesity hypoventilation syndrome (HCC)    Orthostatic hypotension    OSA on CPAP    8 cm H2O   OSTEOARTHRITIS    Paroxysmal atrial fibrillation ([place]) 2003    (? chronic?) Off anticoag for a while due to falls.  Then apixaban started 12/2014.   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 Ella Bodo): neuropathic pain->diff to treat, failed nucynta, failed spinal stimul trial, oxycontin hs + tramadol + gabap as of 12/2017 f/u Dr. Letta Pate.   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   Sick sinus syndrome (HCC)    PPM placed   Thrombocytopenia ([place]) 2018   HSM on 2007 abd u/s---suspect  some mild splenic sequestration chronically.   Unspecified glaucoma(365.9)    Unspecified hereditary and idiopathic peripheral neuropathy approx age 64   bilat LE's, ? left arm, too.  Feet became progressively numb + left foot pain intermittently.  Pt may be trying a spinal stimulator (as of 05/2015)   Vaccine counseling 11/16/2021   VENOUS INSUFFICIENCY    Being followed by Dr. Sharol Given as of 10/2016 for two R LL venous stasis ulcers/skin tears.  Healed as of 10/30/16 f/u with Dr. Sharol Given.   Venous stasis dermatitis 12/27/2021   VENTRAL HERNIA     Family History  Problem Relation Age of Onset   Hypertension Mother    Coronary artery disease Mother    Heart attack Mother    Neuropathy Mother    Pulmonary fibrosis Father        asbestosis    Past Surgical History:  Procedure Laterality Date   AMPUTATION Left 04/11/2013   Procedure: AMPUTATION DIGIT Left 3rd toe;  Surgeon: [name], MD;  Location: [facility];  Service: Orthopedics;  Laterality: Left;  Left 3rd toe amputation at [facility]   AMPUTATION Left 08/29/2019   Procedure: LEFT TRANSMETATARSAL AMPUTATION;  Surgeon: [name], MD;  Location: [facility];  Service: Orthopedics;  Laterality: Left;   BIOPSY  05/04/2020   Procedure: BIOPSY;  Surgeon: Jackquline Denmark, MD;  Location[facility] ENDOSCOPY;  Service: Endoscopy;;   [place]; 03/10/16   1997 Non-obstructive disease.  03/2016 BMS to RCA, with 25% pDiag dz, o/w normal cors per cath 03/07/16.  Cath 11/01/16: in stent restenosis, successful baloon angioplasty. 08/2020 branch vessel dz, cont medical therapy rec'd.   CARDIAC CATHETERIZATION  12/24/2012   mild < 20% LCx, prox 30% RCA; LVEF 55-65% , moderate pulmonary HTN, moderate AS   CARDIAC DEFIBRILLATOR PLACEMENT  11/07/2017   [facility] MRI Quad CRT defibrillator   CARDIOVASCULAR STRESS TEST  05/11/16 ([facility])   2017 Myocardial perfusion imaging:  No ischemia; scar in apex, global hypokinesis, EF 36%.  06/15/20->mod primarily  fixed inferolat  wall defect mildly worse with stress c/w infarct/scar with mild peri-infarct ischemia, normal EF-->for cath per cards.   Carotid dopplers  03/09/2016   [facility]: no hemodynamically significant stenosis on either side.   CHOLECYSTECTOMY     COLONOSCOPY [name] 10/30/2012   Procedure: COLONOSCOPY;  Surgeon: [name], MD;  Location: [facility] ENDOSCOPY;  Service: Endoscopy;  Laterality: [name];   CORONARY ANGIOPLASTY WITH STENT PLACEMENT  03/2016; 04/2017   2017-[facility]: BMS to RCA-pt was placed on Brilinta.  04/2017: DES to RCA.   DORSAL SLIT [name] 10/29/2019   for severe phimosis. Procedure: DORSAL SLIT;  Surgeon: Alexis Frock, MD;  Location: [facility] ORS;  Service: Urology;  Laterality: [name];  [place]  05/04/2020   Procedure: ESOPHAGEAL DILATION;  Surgeon: Jackquline Denmark, MD;  Location[facility];  Service: Endoscopy;;   ESOPHAGOGASTRODUODENOSCOPY (EGD) WITH PROPOFOL [name] 05/04/2020   Procedure: ESOPHAGOGASTRODUODENOSCOPY (EGD) WITH PROPOFOL;  Surgeon: Jackquline Denmark, MD;  Location: [facility] ENDOSCOPY;  Service: Endoscopy;  Laterality: [name];   EYE SURGERY Bilateral cataract   HEMORRHOID SURGERY     INTRAOCULAR LENS INSERTION Bilateral    KNEE SURGERY Right    LEFT AND RIGHT HEART CATHETERIZATION WITH CORONARY ANGIOGRAM [name] 12/24/2012   Procedure: LEFT AND RIGHT HEART CATHETERIZATION WITH CORONARY ANGIOGRAM;  Surgeon: [name], MD;  Location: [facility] CATH LAB;  Service: Cardiovascular;  Laterality: [name];   LEG SURGERY Bilateral    lenghtening    PACEMAKER PLACEMENT  04/13/2016   2nd deg HB after TAVR, pt had DC MDT PPM placed.   SHOULDER ARTHROSCOPY  08/30/2011   Procedure: ARTHROSCOPY SHOULDER;  Surgeon: [name], MD;  Location: [facility];  Service: Orthopedics;  Laterality: Right;  Right Shoulder Arthroscopy, Debridement, and Decompression   SPINAL CORD STIMULATOR INSERTION [name] 09/10/2015   Procedure: LUMBAR SPINAL CORD STIMULATOR INSERTION;  Surgeon: [name], MD;  Location: [facility] NEURO  ORS;  Service: Neurosurgery;  Laterality: [name];   TEE WITHOUT CARDIOVERSION [name] 09/16/2019   Procedure: TRANSESOPHAGEAL ECHOCARDIOGRAM (TEE);  Surgeon: Skeet Latch, MD;  Location[facility] ENDOSCOPY;  Service: Cardiovascular;  Laterality: [name];   TEE WITHOUT CARDIOVERSION [name] 12/02/2019   +vegetation on AVR and pacer lead in RV.  EF 55-60%, normal wall motion.  Valves function normal.  Procedure: TRANSESOPHAGEAL ECHOCARDIOGRAM (TEE);  Surgeon: Geralynn Rile, MD;  Location: [facility];  Service: Cardiovascular;  Laterality: [name];   TOE AMPUTATION Left    due to osteomyelitis.  R big toe surg due to osteoarth   TONSILLECTOMY     traeculectomy Left    eye   TRANSCATHETER AORTIC VALVE REPLACEMENT, TRANSFEMORAL  04/11/2016   TRANSESOPHAGEAL ECHOCARDIOGRAM  03/09/2016; 09/2019   [facility]: EF 55-60%, PFO seen with bi-directional shunting, no thrombus in appendage.  09/2019 ->no valvular vegetations. Small patent foramen ovale with predominantly left to right shunting across the interatrial septum.   TRANSTHORACIC ECHOCARDIOGRAM  01/2015; 01/2016; 05/18/16; 09/18/16, 05/2017, 08/2017   01/2015 No signif change in aortic stenosis (moderate).  01/2016 Severe LVH w/small LV cavity, EF 60-65%, grade I diast dysfxn.  05/2016 (s/p TAVR): EF 50-55%, grd I DD, biopros AV good.  08/2016--EF 50-55%, LV septal motion c/w conduction abnl, grd I DD,mild MS,bioprosth aortic valve well seated, w/trace AR. 05/2017 TTE EF 35%. 08/2017-EF 35%, mod diff hypokin LV, grd I DD, biopros AV good.    TRANSTHORACIC ECHOCARDIOGRAM  04/2018; 09/2019   04/2018: EF 40-45%, mod diffuse LV hypokin, grd I DD, bioprosth AV well seated, no  AS or AR. 09/2019 EF 60-65%, grd I DD, valves fine, including bioprosth AV. 04/29/20 (tech diff) EF 55-60%, grd I DD, vegetation on MV.  07/2021 EF 55-60% (s/p upgrade to BiV PPM), AV well seated.   VITRECTOMY     Social History   Occupational History   Occupation: Engineer, production    Comment: retired  Tobacco  Use   Smoking status: Never   Smokeless tobacco: Never  Scientific laboratory technician Use: Never used  Substance and Sexual Activity   Alcohol use: No    Alcohol/week: 0.0 standard drinks of alcohol   Drug use: No    Types: Oxycodone   Sexual activity: Not Currently

## 2022-02-04 ENCOUNTER — Telehealth: Payer: Self-pay

## 2022-02-04 NOTE — Telephone Encounter (Signed)
Called patient to schedule medicare well visit.  He declined.  Asked to please not call again for AMV.

## 2022-02-10 ENCOUNTER — Ambulatory Visit: Payer: Medicare Other | Admitting: Family Medicine

## 2022-02-10 ENCOUNTER — Encounter: Payer: Self-pay | Admitting: Family Medicine

## 2022-02-10 VITALS — BP 124/75 | HR 78 | Temp 98.1°F | Ht 76.0 in

## 2022-02-10 DIAGNOSIS — D696 Thrombocytopenia, unspecified: Secondary | ICD-10-CM | POA: Diagnosis not present

## 2022-02-10 DIAGNOSIS — D649 Anemia, unspecified: Secondary | ICD-10-CM

## 2022-02-10 DIAGNOSIS — L03119 Cellulitis of unspecified part of limb: Secondary | ICD-10-CM

## 2022-02-10 DIAGNOSIS — I2510 Atherosclerotic heart disease of native coronary artery without angina pectoris: Secondary | ICD-10-CM

## 2022-02-10 DIAGNOSIS — E1140 Type 2 diabetes mellitus with diabetic neuropathy, unspecified: Secondary | ICD-10-CM

## 2022-02-10 DIAGNOSIS — R600 Localized edema: Secondary | ICD-10-CM

## 2022-02-10 DIAGNOSIS — M792 Neuralgia and neuritis, unspecified: Secondary | ICD-10-CM

## 2022-02-10 LAB — POCT GLYCOSYLATED HEMOGLOBIN (HGB A1C)
HbA1c POC (<> result, manual entry): 5.5 % (ref 4.0–5.6)
HbA1c, POC (controlled diabetic range): 5.5 % (ref 0.0–7.0)
HbA1c, POC (prediabetic range): 5.5 % — AB (ref 5.7–6.4)
Hemoglobin A1C: 5.5 % (ref 4.0–5.6)

## 2022-02-10 MED ORDER — OXYCODONE HCL 20 MG PO TABS: ORAL_TABLET | ORAL | 0 refills | Status: DC

## 2022-02-10 NOTE — Progress Notes (Signed)
OFFICE VISIT  02/10/2022  CC:  Chief Complaint  Patient presents with   Follow-up    Leg cellulitis    Patient is a 81 y.o. male who presents for f/u leg cellulitis. I last saw him 6 wks ago. A/P as of that visit: "#1 right lower leg cellulitis, persistent. No change in the last 6 days.  Hard to tell how much of this is actually active infection versus poor venous circulation impairing healing. Discussed application of Unna boot today but patient has appointment to see his infectious disease doctor tomorrow and the area will have to be examined.  For now I will keep him on linezolid 600 mg twice daily for the next 1 week. Patient has follow-up with Dr. Sharol Given next week.   #2 hypotension. Due to his heart failure medications. This has improved a little bit with cutting his Entresto 24-26 tab in half twice daily. We will discontinue Toprol 25 mg today.  Instructed patient to make follow-up with his cardiologist.   #3 type 2 diabetes, with severe peripheral neuropathy.  No meds.   Good control, last A1c 6.1% about 2 months ago. Next a1c 1 mo."  INTERIM HX: 12/27/2021 he was seen by infectious disease and their assessment was that not much active cellulitis present and they checked platelet count and it was low so they switched him from Zyvox to amoxicillin. 12/28/2021 went to his orthopedist and got a Unna boot placed. 01/27/2022 follow-up with orthopedics, their assessment was that his right leg was improving, no new treatments. Today he is happy to report that his right lower leg redness and discomfort is significantly improved per patient.  He has a wrap today and prefers it not be examined.  His cardiologist's office recommended he get back on his Toprol at half of the 25 mg tab daily to see how his blood pressure and symptoms went.  Going so far so good. He Continues Entresto 24-26 half tab twice a day.  Patient states that his intermittent left foot jabbing/sharp pain is worsening  and at times severe.  This is despite taking 800 mg of gabapentin 4 times a day.  He states that this medication has overall helped quite a bit but not as much lately. He had an expired oxycodone 20 mg tab at home and took this and says it did bring significant relief.  ROS as above, plus--> no fevers, no CP, no SOB at rest but he has chronic dyspnea on exertion which is stable., no wheezing, no cough, no dizziness, no HAs, no rashes, no melena/hematochezia.  No polyuria or polydipsia.  No myalgias or arthralgias.  No focal weakness, paresthesias, or tremors.  No acute vision or hearing abnormalities.  No dysuria or unusual/new urinary urgency or frequency.  No recent changes in lower legs. No n/v/d or abd pain.  No palpitations.     Past Medical History:  Diagnosis Date   AICD (automatic cardioverter/defibrillator) present    Balanitis    +severe phimosis+ buried penis->circ not possible but dorsal slit done 10/2019   BPH (benign prostatic hypertrophy)    with urinary retention.  Renal u/s 12/04/19 NORMAL KIDNEYS, NORMAL BLADDER, NO HYDRONEPHROSIS   CAD (coronary artery disease)    Nonobstructive by 12/2012 cath; then 03/2016 he required BMS to RCA ([facility]).  In-stent restenosis on cath 11/01/16, baloon angioplasty successful.   CATARACT, HX OF    Chronic combined systolic and diastolic CHF (congestive heart failure) ([place]) 05/2016   Ischemic CM.  04/2018 EF  40-45%, grd I DD.  07/2021 EF 55-60%, AV well seated   Chronic pain syndrome    Lumbar DDD; chronic neuropathic pain (DM); has spinal stimulator and sees pain mgmt MD   Complicated UTI (urinary tract infection) 09/2019   Phimoses, acute urinary retention, entoerococcus UTI, enterococc bacteremia.  F/u blood clx's neg x 5d.     COPD    Debilitated patient    Diabetes mellitus type 2 with complications ([place])    RDE0C jumped from 5.7% to 6.1% 03/2015---started metformin at that time.  DM 2 dx by fasting gluc criteria 2018.  Has chronic neuropath  pain   Enterococcal bacteremia 09/2019   Phimoses, acute urinary retention, entoerococcus UTI, enterococc bacteremia.  F/u blood clx's neg x 5d.     Essential hypertension, benign    Fatty liver 2007   2007 u/s showed fatty liver with hepatosplenomegaly.  2019 repeat u/s->fatty liver but no cirrhosis or hepatosplenomegaly.   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 Tommy Medal)   Lumbosacral neuritis    Lumbosacral spondylosis    Lumbar spinal stenosis with neurogenic claudication--contributes to his chronic pain syndrome   Morbid obesity ([place])    Normal memory function 08/2014   Neuropsychological testing ([facility]): no cognitive impairment or sign of neurodegenerative disorder.  Likely has adjustment d/o with mixed anxiety/depressed features and may benefit from low dose SNRI.     Normocytic anemia 03/2016   Mild-pt needs ferritin and vit B12 level checked (as of 03/22/16). Hb stable 09/2019   NSTEMI (non-ST elevated myocardial infarction) ([place]) 03/20/2016   BMS to RCA   Obesity hypoventilation syndrome (HCC)    Orthostatic hypotension    OSA on CPAP    8 cm H2O   OSTEOARTHRITIS    Paroxysmal atrial fibrillation ([place]) 2003    (? chronic?) Off anticoag for a while due to falls.  Then apixaban started 12/2014.   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 Ella Bodo): neuropathic pain->diff to treat, failed nucynta, failed spinal stimul trial, oxycontin hs + tramadol + gabap as of 12/2017 f/u Dr. Letta Pate.   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   Sick sinus syndrome (HCC)    PPM placed   Thrombocytopenia ([place]) 2018   HSM on 2007 abd u/s---suspect some mild splenic sequestration chronically.   Unspecified glaucoma(365.9)    Unspecified hereditary and idiopathic peripheral neuropathy  approx age 37   bilat LE's, ? left arm, too.  Feet became progressively numb + left foot pain intermittently.  Pt may be trying a spinal stimulator (as of 05/2015)   Vaccine counseling 11/16/2021   VENOUS INSUFFICIENCY    Being followed by Dr. Sharol Given as of 10/2016 for two R LL venous stasis ulcers/skin tears.  Healed as of 10/30/16 f/u with Dr. Sharol Given.   Venous stasis dermatitis 12/27/2021   VENTRAL HERNIA     Past Surgical History:  Procedure Laterality Date   AMPUTATION Left 04/11/2013   Procedure: AMPUTATION DIGIT Left 3rd toe;  Surgeon: [name], MD;  Location: [place];  Service: Orthopedics;  Laterality: Left;  Left 3rd toe amputation at [facility]   AMPUTATION Left 08/29/2019   Procedure: LEFT TRANSMETATARSAL AMPUTATION;  Surgeon: [name], MD;  Location: [place];  Service: Orthopedics;  Laterality: Left;   BIOPSY  05/04/2020   Procedure: BIOPSY;  Surgeon: Jackquline Denmark, MD;  Location: [facility] ENDOSCOPY;  Service: Endoscopy;;   [place]; 03/10/16   1997 Non-obstructive disease.  03/2016 BMS  to RCA, with 25% pDiag dz, o/w normal cors per cath 03/07/16.  Cath 11/01/16: in stent restenosis, successful baloon angioplasty. 08/2020 branch vessel dz, cont medical therapy rec'd.   CARDIAC CATHETERIZATION  12/24/2012   mild < 20% LCx, prox 30% RCA; LVEF 55-65% , moderate pulmonary HTN, moderate AS   CARDIAC DEFIBRILLATOR PLACEMENT  11/07/2017   [facility] MRI Quad CRT defibrillator   CARDIOVASCULAR STRESS TEST  05/11/16 ([facility])   2017 Myocardial perfusion imaging:  No ischemia; scar in apex, global hypokinesis, EF 36%.  06/15/20->mod primarily fixed inferolat wall defect mildly worse with stress c/w infarct/scar with mild peri-infarct ischemia, normal EF-->for cath per cards.   Carotid dopplers  03/09/2016   [facility]: no hemodynamically significant stenosis on either side.   CHOLECYSTECTOMY     COLONOSCOPY [name] 10/30/2012   Procedure: COLONOSCOPY;  Surgeon: [name], MD;  Location: [facility] ENDOSCOPY;  Service: Endoscopy;  Laterality: [name];   CORONARY ANGIOPLASTY WITH STENT PLACEMENT  03/2016; 04/2017   2017-[facility]: BMS to RCA-pt was placed on Brilinta.  04/2017: DES to RCA.   DORSAL SLIT [name] 10/29/2019   for severe phimosis. Procedure: DORSAL SLIT;  Surgeon: Alexis Frock, MD;  Location: [facility] ORS;  Service: Urology;  Laterality: [name];  [place]  05/04/2020   Procedure: ESOPHAGEAL DILATION;  Surgeon: Jackquline Denmark, MD;  Location: [name] ENDOSCOPY;  Service: Endoscopy;;   ESOPHAGOGASTRODUODENOSCOPY (EGD) WITH PROPOFOL [name] 05/04/2020   Procedure: ESOPHAGOGASTRODUODENOSCOPY (EGD) WITH PROPOFOL;  Surgeon: Jackquline Denmark, MD;  Location: [facility] ENDOSCOPY;  Service: Endoscopy;  Laterality: [name];   EYE SURGERY Bilateral cataract   HEMORRHOID SURGERY     INTRAOCULAR LENS INSERTION Bilateral    KNEE SURGERY Right    LEFT AND RIGHT HEART CATHETERIZATION WITH CORONARY ANGIOGRAM [name] 12/24/2012   Procedure: LEFT AND RIGHT HEART CATHETERIZATION WITH CORONARY ANGIOGRAM;  Surgeon: [name], MD;   Location: [facility] CATH LAB;  Service: Cardiovascular;  Laterality: [name];   LEG SURGERY Bilateral    lenghtening    PACEMAKER PLACEMENT  04/13/2016   2nd deg HB after TAVR, pt had DC MDT PPM placed.   SHOULDER ARTHROSCOPY  08/30/2011   Procedure: ARTHROSCOPY SHOULDER;  Surgeon: [name], MD;  Location: [place];  Service: Orthopedics;  Laterality: Right;  Right Shoulder Arthroscopy, Debridement, and Decompression   SPINAL CORD STIMULATOR INSERTION [name] 09/10/2015   Procedure: LUMBAR SPINAL  CORD STIMULATOR INSERTION;  Surgeon: [name], MD;  Location: [place] NEURO ORS;  Service: Neurosurgery;  Laterality: [name];   TEE WITHOUT CARDIOVERSION [name] 09/16/2019   Procedure: TRANSESOPHAGEAL ECHOCARDIOGRAM (TEE);  Surgeon: Skeet Latch, MD;  Location: [name] ENDOSCOPY;  Service: Cardiovascular;  Laterality: [name];   TEE WITHOUT CARDIOVERSION [name] 12/02/2019   +vegetation on AVR and pacer lead in RV.  EF 55-60%, normal wall motion.  Valves function normal.  Procedure: TRANSESOPHAGEAL ECHOCARDIOGRAM (TEE);  Surgeon: Geralynn Rile, MD;  Location: [place];  Service: Cardiovascular;  Laterality: [name];   TOE AMPUTATION Left    due to osteomyelitis.  R big toe surg due to osteoarth   TONSILLECTOMY     traeculectomy Left    eye   TRANSCATHETER AORTIC VALVE REPLACEMENT, TRANSFEMORAL  04/11/2016   TRANSESOPHAGEAL ECHOCARDIOGRAM  03/09/2016; 09/2019   [facility]: EF 55-60%, PFO seen with bi-directional shunting, no thrombus in appendage.  09/2019 ->no valvular vegetations. Small patent foramen ovale with predominantly left to right shunting across the interatrial septum.   TRANSTHORACIC ECHOCARDIOGRAM  01/2015; 01/2016; 05/18/16; 09/18/16, 05/2017, 08/2017   01/2015 No signif change in aortic stenosis (moderate).  01/2016 Severe LVH w/small LV cavity, EF 60-65%, grade I diast dysfxn.  05/2016 (s/p TAVR): EF 50-55%, grd I DD, biopros AV good.  08/2016--EF 50-55%, LV septal motion c/w conduction abnl, grd I DD,mild MS,bioprosth  aortic valve well seated, w/trace AR. 05/2017 TTE EF 35%. 08/2017-EF 35%, mod diff hypokin LV, grd I DD, biopros AV good.    TRANSTHORACIC ECHOCARDIOGRAM  04/2018; 09/2019   04/2018: EF 40-45%, mod diffuse LV hypokin, grd I DD, bioprosth AV well seated, no AS or AR. 09/2019 EF 60-65%, grd I DD, valves fine, including bioprosth AV. 04/29/20 (tech diff) EF 55-60%, grd I DD, vegetation on MV.  07/2021 EF 55-60% (s/p upgrade to BiV PPM), AV well seated.   VITRECTOMY      Outpatient Medications Prior to Visit  Medication Sig Dispense Refill   albuterol (PROVENTIL HFA;VENTOLIN HFA) 108 (90 BASE) MCG/ACT inhaler Inhale 2 puffs into the lungs 4 (four) times daily as needed for wheezing or shortness of breath.     allopurinol (ZYLOPRIM) 300 MG tablet TAKE 1 TABLET BY MOUTH EVERY DAY WITH FOOD (Patient taking differently: Take 300 mg by mouth daily. With food) 90 tablet 0   amoxicillin (AMOXIL) 500 MG tablet Take 2 tablets (1,000 mg total) by mouth 2 (two) times daily. Resume taking 6/21 PM after linezolid course completed 120 tablet 11   apixaban (ELIQUIS) 5 MG TABS tablet Take 5 mg by mouth 2 (two) times daily.     ASPIRIN LOW DOSE 81 MG EC tablet Take 81 mg by mouth daily.     B Complex-C (B-COMPLEX WITH VITAMIN C) tablet Take 1 tablet by mouth daily. Take 1 tablet daily, 265m     ezetimibe (ZETIA) 10 MG tablet Take 1 tablet (10 mg total) by mouth daily. 30 tablet 1   finasteride (PROSCAR) 5 MG tablet TAKE 1 TABLET BY MOUTH EVERY DAY (Patient taking differently: Take 5 mg by mouth daily.) 90 tablet 1   gabapentin (NEURONTIN) 800 MG tablet Take 1 tablet (800 mg total) by mouth 4 (four) times daily. 360 tablet 3   KLOR-CON M20 20 MEQ tablet TAKE 1 TABLET BY MOUTH EVERY DAY (Patient taking differently: Take 10 mEq by mouth 2 (two) times daily.) 90 tablet 1   metoprolol succinate (TOPROL-XL) 25 MG 24 hr tablet Take 12.5 mg by mouth daily.  Multiple Vitamin (MULTIVITAMIN ADULT PO) Take 1 tablet by mouth  daily.     niacin (NIASPAN) 1000 MG CR tablet TAKE 1 TABLET BY MOUTH TWICE A DAY 180 tablet 0   nitroGLYCERIN (NITROSTAT) 0.4 MG SL tablet Place 0.4 mg under the tongue every 5 (five) minutes as needed for chest pain.     pantoprazole (PROTONIX) 40 MG tablet TAKE 1 TABLET BY MOUTH EVERY DAY (Patient taking differently: Take 40 mg by mouth daily.) 90 tablet 0   Probiotic Product (ALIGN PO) Take by mouth daily.     rosuvastatin (CRESTOR) 40 MG tablet take 1 tablet by mouth once daily (Patient taking differently: Take 40 mg by mouth daily.) 15 tablet 0   sacubitril-valsartan (ENTRESTO) 24-26 MG Take 1 tablet by mouth 2 (two) times daily.      vitamin C (ASCORBIC ACID) 250 MG tablet Take 250 mg by mouth daily.     VITAMIN D, CHOLECALCIFEROL, PO Take 1 tablet by mouth daily.      WIXELA INHUB 250-50 MCG/ACT AEPB TAKE 1 PUFF BY MOUTH TWICE A DAY (Patient taking differently: Inhale 1 puff into the lungs in the morning and at bedtime.) 60 each 5   ferrous sulfate 324 MG TBEC Take 324 mg by mouth daily. With supper (Patient not taking: Reported on 02/10/2022)     linezolid (ZYVOX) 600 MG tablet Take 1 tablet (600 mg total) by mouth every 12 (twelve) hours. (Patient not taking: Reported on 02/10/2022) 14 tablet 0   Probiotic Product (PROBIOTIC DAILY PO) Take 1 capsule by mouth daily. (Patient not taking: Reported on 02/10/2022)     No facility-administered medications prior to visit.    Allergies  Allergen Reactions   Brimonidine Tartrate Shortness Of Breath    Alphagan-Shortness of breath   Brinzolamide Shortness Of Breath    AZOPT- Shortness of breath   Latanoprost Shortness Of Breath    XALATAN- Shortness of breath   Nucynta [Tapentadol] Shortness Of Breath   Sulfa Antibiotics Palpitations   Timolol Maleate Shortness Of Breath and Other (See Comments)    TIMOPTIC- Aggravated asthma   Diltiazem Swelling     leg swelling   Rofecoxib Swelling     VIOXX- leg swelling   Vancomycin Rash and Other  (See Comments)    Blisters   Codeine Other (See Comments)    Childhood reaction   Tamsulosin Other (See Comments)    Dizziness    Celecoxib Other (See Comments)    CELLBREX-confusion   Colchicine Diarrhea    diarrhea   Tape Rash    ROS As per HPI  PE:    02/10/2022    3:30 PM 12/27/2021    8:45 AM 12/26/2021    2:10 PM  Vitals with BMI  Height 6' 4"   6' 4"   Systolic [phone]  Diastolic 75 75 76  Pulse 78 78 86     Physical Exam  General: Alert and well-appearing. Affect: Pleasant, lucid thought and speech. No further exam today.  LABS:  Last CBC Lab Results  Component Value Date   WBC 4.7 12/27/2021   HGB 11.0 (L) 12/27/2021   HCT 32.9 (L) 12/27/2021   MCV 93.5 12/27/2021   MCH 31.3 12/27/2021   RDW 13.9 12/27/2021   PLT 76 (L) 12/27/2021   Lab Results  Component Value Date   IRON 173 12/20/2021   TIBC 262 12/20/2021   FERRITIN 60 88/41/6606   Last metabolic panel Lab Results  Component Value Date  GLUCOSE 123 (H) 12/15/2021   NA 138 12/15/2021   K 3.8 12/15/2021   CL 105 12/15/2021   CO2 23 12/15/2021   BUN 12 12/15/2021   CREATININE 0.76 12/15/2021   GFRNONAA >60 12/15/2021   CALCIUM 8.7 (L) 12/15/2021   PROT 5.5 (L) 12/15/2021   ALBUMIN 2.8 (L) 12/15/2021   BILITOT 0.7 12/15/2021   ALKPHOS 49 12/15/2021   AST 53 (H) 12/15/2021   ALT 35 12/15/2021   ANIONGAP 10 12/15/2021   Last hemoglobin A1c Lab Results  Component Value Date   HGBA1C 5.5 02/10/2022   HGBA1C 5.5 02/10/2022   HGBA1C 5.5 (A) 02/10/2022   HGBA1C 5.5 02/10/2022   Lab Results  Component Value Date   VITAMINB12 429 05/24/2016   IMPRESSION AND PLAN:  #1 bilateral lower extremity edema, chronic and stable.  This is secondary to his chronic heart failure as well as venous insufficiency. He has chronic venous stasis dermatitis and suspicion recently of superimposed cellulitis on right lower leg. This is improving. He had thrombocytopenia when he was recently on  Zyvox.  He discontinued this medication as instructed by infectious disease MD.  Recheck platelets today.  2.  Neuropathic pain in left foot. Continue gabapentin 800 mg 4 times daily.  Add oxycodone 20 mg, 1 twice daily as needed.  #60 prescribed today.  #3 type 2 diabetes with neuropathy. Well-controlled without glycemic agents. Hemoglobin A1c 5.5% today.  #4 coronary artery disease, chronic combined systolic and diastolic heart failure. Has had some medication dosing limitations due to hypotension/dizziness. Continue Entresto 24-26 one half tab twice a day and so far he is tolerating the Toprol XL one half of the 25 mg tab daily. Lipid panel and complete metabolic panel today.  Forward results to his cardiologist, Dr. Clovis Riley.  An After Visit Summary was printed and given to the patient.  FOLLOW UP: Return in about 3 months (around 05/13/2022) for routine chronic illness f/u.  Signed:  Crissie Sickles, MD           02/10/2022

## 2022-02-11 LAB — CBC
HCT: 33.7 % — ABNORMAL LOW (ref 38.5–50.0)
Hemoglobin: 11.3 g/dL — ABNORMAL LOW (ref 13.2–17.1)
MCH: 30.9 pg (ref 27.0–33.0)
MCHC: 33.5 g/dL (ref 32.0–36.0)
MCV: 92.1 fL (ref 80.0–100.0)
MPV: 10.3 fL (ref 7.5–12.5)
Platelets: 124 10*3/uL — ABNORMAL LOW (ref 140–400)
RBC: 3.66 10*6/uL — ABNORMAL LOW (ref 4.20–5.80)
RDW: 14.3 % (ref 11.0–15.0)
WBC: 5.1 10*3/uL (ref 3.8–10.8)

## 2022-02-11 LAB — HEMOGLOBIN A1C
Hgb A1c MFr Bld: 5.6 % of total Hgb (ref <5.7)
Mean Plasma Glucose: 114 mg/dL
eAG (mmol/L): 6.3 mmol/L

## 2022-02-11 LAB — COMPREHENSIVE METABOLIC PANEL
AG Ratio: 1.5 (calc) (ref 1.0–2.5)
ALT: 17 U/L (ref 9–46)
AST: 24 U/L (ref 10–35)
Albumin: 3.7 g/dL (ref 3.6–5.1)
Alkaline phosphatase (APISO): 58 U/L (ref 35–144)
BUN/Creatinine Ratio: 18 (calc) (ref 6–22)
BUN: 12 mg/dL (ref 7–25)
CO2: 27 mmol/L (ref 20–32)
Calcium: 9.3 mg/dL (ref 8.6–10.3)
Chloride: 107 mmol/L (ref 98–110)
Creat: 0.67 mg/dL — ABNORMAL LOW (ref 0.70–1.22)
Globulin: 2.4 g/dL (calc) (ref 1.9–3.7)
Glucose, Bld: 130 mg/dL — ABNORMAL HIGH (ref 65–99)
Potassium: 4.1 mmol/L (ref 3.5–5.3)
Sodium: 141 mmol/L (ref 135–146)
Total Bilirubin: 0.6 mg/dL (ref 0.2–1.2)
Total Protein: 6.1 g/dL (ref 6.1–8.1)

## 2022-02-11 LAB — LIPID PANEL
Cholesterol: 115 mg/dL (ref <200)
HDL: 58 mg/dL (ref >=40)
LDL Cholesterol (Calc): 43 mg/dL (calc)
Non-HDL Cholesterol (Calc): 57 mg/dL (calc) (ref <130)
Total CHOL/HDL Ratio: 2 (calc) (ref <5.0)
Triglycerides: 65 mg/dL (ref <150)

## 2022-02-13 ENCOUNTER — Ambulatory Visit (INDEPENDENT_AMBULATORY_CARE_PROVIDER_SITE_OTHER): Payer: Medicare Other | Admitting: Infectious Disease

## 2022-02-13 ENCOUNTER — Other Ambulatory Visit: Payer: Self-pay

## 2022-02-13 ENCOUNTER — Encounter: Payer: Self-pay | Admitting: Infectious Disease

## 2022-02-13 VITALS — BP 109/68 | HR 80 | Temp 98.3°F

## 2022-02-13 DIAGNOSIS — M86272 Subacute osteomyelitis, left ankle and foot: Secondary | ICD-10-CM | POA: Diagnosis not present

## 2022-02-13 DIAGNOSIS — T826XXD Infection and inflammatory reaction due to cardiac valve prosthesis, subsequent encounter: Secondary | ICD-10-CM

## 2022-02-13 DIAGNOSIS — Z7185 Encounter for immunization safety counseling: Secondary | ICD-10-CM | POA: Diagnosis not present

## 2022-02-13 DIAGNOSIS — T827XXD Infection and inflammatory reaction due to other cardiac and vascular devices, implants and grafts, subsequent encounter: Secondary | ICD-10-CM | POA: Diagnosis not present

## 2022-02-13 DIAGNOSIS — I330 Acute and subacute infective endocarditis: Secondary | ICD-10-CM

## 2022-02-13 DIAGNOSIS — I38 Endocarditis, valve unspecified: Secondary | ICD-10-CM

## 2022-02-13 MED ORDER — AMOXICILLIN 500 MG PO TABS
1000.0000 mg | ORAL_TABLET | Freq: Two times a day (BID) | ORAL | 11 refills | Status: DC
Start: 2022-02-13 — End: 2022-06-20

## 2022-02-13 NOTE — Progress Notes (Signed)
Subjective:  Chief complaint: Follow-up for pacemaker ICD infection with Enterococcus, osteomyelitis of foot    Patient ID: Tim [name], male    DOB: [birth year], 81 y.o.   MRN: [id number]  HPI  81 y.o. male who  initially presented with AMP sensitive enterococcal UTI complicated by enterococcal bacteremia in March 2021 in the setting of AICD and TAVR. TTE and TEE at the time were without evidence of vegetation and was prescribed 2 weeks of ampicillin. On follow up on 4/21 there was worsening of his amputation site with erythema and drainage. He was placed on doxycycline by Dr. Sharol Given which was then changed to Zyvox by me. This resolved his symptoms. On follow up with myself  on 11/26/2019 he was experiencing fevers and dizziness with concern for systemic infection. Blood cultures in the ID clinic were positive for Enterococcus faecalis bacteremia. Surgical site was well healed at that time. 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 Recommended treatment was 6 weeks of ampicillin and ceftriaxone through 6/21. Feeling better on 6/21 and transitioned to life-long suppression with amoxicillin three times daily.    Tim [name] was then seen on 7/15 /2021 seen by Dr. Johnnye Sima with concern for cellulitis and was continued on his Amoxicillin. He followed up with Dr. Johnnye Sima on 8/6 and continued to do well with Amoxicillin.   Blood cultures no growth but MV had vegetation. TEE was not successful.  He was retreated with ampicillin high-dose and ceftriaxone twice daily.  He completed 6 weeks of therapy and  was then on  high-dose amoxicillin to 1000 g twice daily.  Followed very closely by Dr. Sharol Given for his chronic surgical wound site where he had osteomyelitis in that area is been doing well according to him and also going to Dr. Jess Barters notes.  He was then admitted to [name] with infection of his foot having presented with redness and drainage from the left TMA site. Blood cultures  from admission were negative. CT of the left foot had cellulitis of the distal forefoot with abscess and acute osteomyelitis of the second third and fourth metatarsal stumps. There was also possible acute osteomyelitis of the first metatarsal stump.  On 1/12 he underwent I&D with revision of transmetatarsal amputation and bone biopsy.  Operative culture grew 1 colony of staph lugdinensis. There was concern for residual osteomyelitis  He was discharged to SNF on 1/22 on cefepime/flagyl with plans to continue through 2/22. He presented back to the ED 1/23 reporting inadequate care at the SNF. He was afebrile with WBC 7,300 and procalcitonin 0.16. He reported some queasiness and metallic taste in his mouth concerning for flagyl adverse events. He was changed to daptomycin and ertapenem to finish his course and sent back to the facility on 07/28/2021  He followed up with Dr. Bonney Roussel. He was also was seen in followup with Dr.Taylor with Podiatry with [facility] and later with Dr. Sharol Given here in [place] who has been happy with his progress. After finishing his  IV antibiotics he went back to chronic high dose suppressive therapy with amoxicillin 1 gram po bid.  As I last saw Tim [name] was admitted the hospital and was treated for possible cellulitis in the context of superimposed venous stasis changes.  He was given Zyvox IV and then a supply of oral Zyvox which was then extended by his primary care physician.   Since I last saw Tim [name] he seems improved and is continue to follow closely Dr. Sharol Given who has performed local  debridement on his foot.  .       Past Medical History:  Diagnosis Date   AICD (automatic cardioverter/defibrillator) present    Balanitis    +severe phimosis+ buried penis->circ not possible but dorsal slit done 10/2019   BPH (benign prostatic hypertrophy)    with urinary retention.  Renal u/s 12/04/19 NORMAL KIDNEYS, NORMAL BLADDER, NO HYDRONEPHROSIS   CAD (coronary artery disease)     Nonobstructive by 12/2012 cath; then 03/2016 he required BMS to RCA ([facility]).  In-stent restenosis on cath 11/01/16, baloon angioplasty successful.   CATARACT, HX OF    Chronic combined systolic and diastolic CHF (congestive heart failure) ([place]) 05/2016   Ischemic CM.  04/2018 EF 40-45%, grd I DD.  07/2021 EF 55-60%, AV well seated   Chronic pain syndrome    Lumbar DDD; chronic neuropathic pain (DM); has spinal stimulator and sees pain mgmt MD   Complicated UTI (urinary tract infection) 09/2019   Phimoses, acute urinary retention, entoerococcus UTI, enterococc bacteremia.  F/u blood clx's neg x 5d.     COPD    Debilitated patient    Diabetes mellitus type 2 with complications ([place])    BTD1V jumped from 5.7% to 6.1% 03/2015---started metformin at that time.  DM 2 dx by fasting gluc criteria 2018.  Has chronic neuropath pain   Enterococcal bacteremia 09/2019   Phimoses, acute urinary retention, entoerococcus UTI, enterococc bacteremia.  F/u blood clx's neg x 5d.     Essential hypertension, benign    Fatty liver 2007   2007 u/s showed fatty liver with hepatosplenomegaly.  2019 repeat u/s->fatty liver but no cirrhosis or hepatosplenomegaly.   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 Tommy Medal)   Lumbosacral neuritis    Lumbosacral spondylosis    Lumbar spinal stenosis with neurogenic claudication--contributes to his chronic pain syndrome   Morbid obesity ([place])    Normal memory function 08/2014   Neuropsychological testing ([facility]): no cognitive impairment or sign of neurodegenerative disorder.  Likely has adjustment d/o  with mixed anxiety/depressed features and may benefit from low dose SNRI.     Normocytic anemia 03/2016   Mild-pt needs ferritin and vit B12 level checked (as of 03/22/16). Hb stable 09/2019   NSTEMI (non-ST elevated myocardial infarction) ([place]) 03/20/2016   BMS to RCA   Obesity hypoventilation syndrome (HCC)    Orthostatic hypotension    OSA on CPAP    8 cm H2O   OSTEOARTHRITIS    Paroxysmal atrial fibrillation ([place]) 2003    (? chronic?) Off anticoag for a while due to falls.  Then apixaban started 12/2014.   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 Ella Bodo): neuropathic pain->diff to treat, failed nucynta, failed spinal stimul trial, oxycontin hs + tramadol + gabap as of 12/2017 f/u Dr. Letta Pate.   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   Sick sinus syndrome (HCC)    PPM placed   Thrombocytopenia ([place]) 2018   HSM on 2007 abd u/s---suspect some mild splenic sequestration chronically.   Unspecified glaucoma(365.9)    Unspecified hereditary and idiopathic peripheral neuropathy approx age 72   bilat LE's, ? left arm, too.  Feet became progressively numb + left foot pain intermittently.  Pt may be trying a spinal stimulator (as of 05/2015)   Vaccine counseling 11/16/2021   VENOUS  INSUFFICIENCY    Being followed by Dr. Sharol Given as of 10/2016 for two R LL venous stasis ulcers/skin tears.  Healed as of 10/30/16 f/u with Dr. Sharol Given.   Venous stasis dermatitis 12/27/2021   VENTRAL HERNIA     Past Surgical History:  Procedure Laterality Date   AMPUTATION Left 04/11/2013   Procedure: AMPUTATION DIGIT Left 3rd toe;  Surgeon: [name], MD;  Location: [facility];  Service: Orthopedics;  Laterality: Left;  Left 3rd toe amputation at [facility]   AMPUTATION Left 08/29/2019   Procedure: LEFT TRANSMETATARSAL AMPUTATION;  Surgeon: [name], MD;  Location: [place];  Service: Orthopedics;  Laterality: Left;   BIOPSY  05/04/2020   Procedure: BIOPSY;  Surgeon: Jackquline Denmark, MD;  Location: [facility] ENDOSCOPY;  Service: Endoscopy;;   [place]; 03/10/16   1997 Non-obstructive disease.  03/2016 BMS to RCA, with 25% pDiag dz, o/w normal cors per cath 03/07/16.  Cath 11/01/16: in stent restenosis, successful baloon angioplasty. 08/2020 branch vessel dz, cont medical therapy rec'd.   CARDIAC CATHETERIZATION  12/24/2012   mild < 20% LCx, prox 30% RCA; LVEF 55-65% , moderate pulmonary HTN, moderate AS   CARDIAC DEFIBRILLATOR PLACEMENT  11/07/2017   [place] MRI Quad CRT defibrillator   CARDIOVASCULAR STRESS TEST  05/11/16 ([facility])   2017 Myocardial perfusion imaging:  No ischemia; scar in apex, global hypokinesis, EF 36%.  06/15/20->mod primarily fixed inferolat wall defect mildly worse with stress c/w infarct/scar with mild peri-infarct ischemia, normal EF-->for cath per cards.   Carotid dopplers  03/09/2016   [facility]: no hemodynamically significant stenosis on either side.   CHOLECYSTECTOMY     COLONOSCOPY [name] 10/30/2012   Procedure: COLONOSCOPY;  Surgeon: [name], MD;  Location: [facility] ENDOSCOPY;  Service: Endoscopy;  Laterality: [name];   CORONARY ANGIOPLASTY WITH STENT PLACEMENT  03/2016; 04/2017   2017-[facility]: BMS to RCA-pt was placed on Brilinta.  04/2017: DES to RCA.   DORSAL SLIT [name]  10/29/2019   for severe phimosis. Procedure: DORSAL SLIT;  Surgeon: Alexis Frock, MD;  Location: [facility] ORS;  Service: Urology;  Laterality: [name];  [place]  05/04/2020   Procedure: ESOPHAGEAL DILATION;  Surgeon: Jackquline Denmark, MD;  Location: [facility] ENDOSCOPY;  Service: Endoscopy;;   ESOPHAGOGASTRODUODENOSCOPY (EGD) WITH PROPOFOL [name] 05/04/2020   Procedure: ESOPHAGOGASTRODUODENOSCOPY (EGD) WITH PROPOFOL;  Surgeon: Jackquline Denmark, MD;  Location: [facility] ENDOSCOPY;  Service: Endoscopy;  Laterality: [name];   EYE SURGERY Bilateral cataract   HEMORRHOID SURGERY     INTRAOCULAR LENS INSERTION Bilateral    KNEE SURGERY Right  LEFT AND RIGHT HEART CATHETERIZATION WITH CORONARY ANGIOGRAM [name] 12/24/2012   Procedure: LEFT AND RIGHT HEART CATHETERIZATION WITH CORONARY ANGIOGRAM;  Surgeon: [name], MD;  Location: [facility] CATH LAB;  Service: Cardiovascular;  Laterality: [name];   LEG SURGERY Bilateral    lenghtening    PACEMAKER PLACEMENT  04/13/2016   2nd deg HB after TAVR, pt had DC MDT PPM placed.   SHOULDER ARTHROSCOPY  08/30/2011   Procedure: ARTHROSCOPY SHOULDER;  Surgeon: [name], MD;  Location: [place];  Service: Orthopedics;  Laterality: Right;  Right Shoulder Arthroscopy, Debridement, and Decompression   SPINAL CORD STIMULATOR INSERTION [name] 09/10/2015   Procedure: LUMBAR SPINAL CORD STIMULATOR INSERTION;  Surgeon: [name], MD;  Location: [place] NEURO ORS;  Service: Neurosurgery;  Laterality: [name];   TEE WITHOUT CARDIOVERSION [name] 09/16/2019   Procedure: TRANSESOPHAGEAL ECHOCARDIOGRAM (TEE);  Surgeon: Skeet Latch, MD;  Location: [name] ENDOSCOPY;  Service: Cardiovascular;  Laterality: [name];   TEE WITHOUT CARDIOVERSION [name] 12/02/2019   +vegetation on AVR and pacer lead in RV.  EF 55-60%, normal wall motion.  Valves function normal.  Procedure: TRANSESOPHAGEAL ECHOCARDIOGRAM (TEE);  Surgeon: Geralynn Rile, MD;  Location: [place];  Service: Cardiovascular;  Laterality: [name];    TOE AMPUTATION Left    due to osteomyelitis.  R big toe surg due to osteoarth   TONSILLECTOMY     traeculectomy Left    eye   TRANSCATHETER AORTIC VALVE REPLACEMENT, TRANSFEMORAL  04/11/2016   TRANSESOPHAGEAL ECHOCARDIOGRAM  03/09/2016; 09/2019   [facility]: EF 55-60%, PFO seen with bi-directional shunting, no thrombus in appendage.  09/2019 ->no valvular vegetations. Small patent foramen ovale with predominantly left to right shunting across the interatrial septum.   TRANSTHORACIC ECHOCARDIOGRAM  01/2015; 01/2016; 05/18/16; 09/18/16, 05/2017, 08/2017   01/2015 No signif change in aortic stenosis (moderate).  01/2016 Severe LVH w/small LV cavity, EF 60-65%, grade I diast dysfxn.  05/2016 (s/p TAVR): EF 50-55%, grd I DD, biopros AV good.  08/2016--EF 50-55%, LV septal motion c/w conduction abnl, grd I DD,mild MS,bioprosth aortic valve well seated, w/trace AR. 05/2017 TTE EF 35%. 08/2017-EF 35%, mod diff hypokin LV, grd I DD, biopros AV good.    TRANSTHORACIC ECHOCARDIOGRAM  04/2018; 09/2019   04/2018: EF 40-45%, mod diffuse LV hypokin, grd I DD, bioprosth AV well seated, no AS or AR. 09/2019 EF 60-65%, grd I DD, valves fine, including bioprosth AV. 04/29/20 (tech diff) EF 55-60%, grd I DD, vegetation on MV.  07/2021 EF 55-60% (s/p upgrade to BiV PPM), AV well seated.   VITRECTOMY      Family History  Problem Relation Age of Onset   Hypertension Mother    Coronary artery disease Mother    Heart attack Mother    Neuropathy Mother    Pulmonary fibrosis Father        asbestosis      Social History   Socioeconomic History   Marital status: Married    Spouse name: Not on file   Number of children: 0   Years of education: 20   Highest education level: Not on file  Occupational History   Occupation: Engineer, production    Comment: retired  Tobacco Use   Smoking status: Never   Smokeless tobacco: Never  Scientific laboratory technician Use: Never used  Substance and Sexual Activity   Alcohol use: No    Alcohol/week: 0.0  standard drinks of alcohol   Drug use: No    Types: Oxycodone   Sexual activity: Not Currently  Other Topics Concern   Not on file  Social History Narrative   HSG, [name] - BS, [name] - MS-engineering, 2 years on PhD - [place]. Married - '65 - 83yr/divorced; '76- 3 yrs/divorced; '92 . No children. 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 ACP/Living Will - Yes CPR; long-term Mechanical ventilation as long as he was able to cognate; ok for long term artificial nutrition. Precondition being able to cognate and not to have too much pain.    Social Determinants of Health   Financial Resource Strain: Not on file  Food Insecurity: Not on file  Transportation Needs: Not on file  Physical Activity: Not on file  Stress: Not on file  Social Connections: Not on file    Allergies  Allergen Reactions   Brimonidine Tartrate Shortness Of Breath    Alphagan-Shortness of breath   Brinzolamide Shortness Of Breath    AZOPT- Shortness of breath   Latanoprost Shortness Of Breath    XALATAN- Shortness of breath   Nucynta [Tapentadol] Shortness Of Breath   Sulfa Antibiotics Palpitations   Timolol Maleate Shortness Of Breath and Other (See Comments)    TIMOPTIC- Aggravated asthma   Diltiazem Swelling     leg swelling   Rofecoxib Swelling     VIOXX- leg swelling   Vancomycin Rash and Other (See Comments)    Blisters   Codeine Other (See Comments)    Childhood reaction   Tamsulosin Other (See Comments)    Dizziness    Celecoxib Other (See Comments)    CELLBREX-confusion   Colchicine Diarrhea    diarrhea   Tape Rash     Current Outpatient Medications:    albuterol (PROVENTIL HFA;VENTOLIN HFA) 108 (90 BASE) MCG/ACT inhaler, Inhale 2 puffs into the lungs 4 (four) times daily as needed for wheezing or shortness of breath., Disp: , Rfl:    allopurinol (ZYLOPRIM) 300 MG tablet, TAKE 1 TABLET BY MOUTH EVERY DAY WITH FOOD (Patient taking  differently: Take 300 mg by mouth daily. With food), Disp: 90 tablet, Rfl: 0   apixaban (ELIQUIS) 5 MG TABS tablet, Take 5 mg by mouth 2 (two) times daily., Disp: , Rfl:    ASPIRIN LOW DOSE 81 MG EC tablet, Take 81 mg by mouth daily., Disp: , Rfl:    B Complex-C (B-COMPLEX WITH VITAMIN C) tablet, Take 1 tablet by mouth daily. Take 1 tablet daily, 2570m Disp: , Rfl:    ezetimibe (ZETIA) 10 MG tablet, Take 1 tablet (10 mg total) by mouth daily., Disp: 30 tablet, Rfl: 1   finasteride (PROSCAR) 5 MG tablet, TAKE 1 TABLET BY MOUTH EVERY DAY (Patient taking differently: Take 5 mg by mouth daily.), Disp: 90 tablet, Rfl: 1   gabapentin (NEURONTIN) 800 MG tablet, Take 1 tablet (800 mg total) by mouth 4 (four) times daily., Disp: 360 tablet, Rfl: 3   KLOR-CON M20 20 MEQ tablet, TAKE 1 TABLET BY MOUTH EVERY DAY (Patient taking differently: Take 10 mEq by mouth 2 (two) times daily.), Disp: 90 tablet, Rfl: 1   metoprolol succinate (TOPROL-XL) 25 MG 24 hr tablet, Take 12.5 mg by mouth daily., Disp: , Rfl:    Multiple Vitamin (MULTIVITAMIN ADULT PO), Take 1 tablet by mouth daily., Disp: , Rfl:    niacin (NIASPAN) 1000 MG CR tablet, TAKE 1 TABLET BY MOUTH TWICE A DAY, Disp: 180 tablet, Rfl: 0   nitroGLYCERIN (NITROSTAT) 0.4 MG SL tablet, Place 0.4 mg under the  tongue every 5 (five) minutes as needed for chest pain., Disp: , Rfl:    Oxycodone HCl 20 MG TABS, 1 tab po bid prn severe pain, Disp: 60 tablet, Rfl: 0   pantoprazole (PROTONIX) 40 MG tablet, TAKE 1 TABLET BY MOUTH EVERY DAY (Patient taking differently: Take 40 mg by mouth daily.), Disp: 90 tablet, Rfl: 0   Probiotic Product (ALIGN PO), Take by mouth daily., Disp: , Rfl:    rosuvastatin (CRESTOR) 40 MG tablet, take 1 tablet by mouth once daily (Patient taking differently: Take 40 mg by mouth daily.), Disp: 15 tablet, Rfl: 0   sacubitril-valsartan (ENTRESTO) 24-26 MG, Take 1 tablet by mouth 2 (two) times daily. , Disp: , Rfl:    vitamin C (ASCORBIC ACID) 250  MG tablet, Take 250 mg by mouth daily., Disp: , Rfl:    VITAMIN D, CHOLECALCIFEROL, PO, Take 1 tablet by mouth daily. , Disp: , Rfl:    WIXELA INHUB 250-50 MCG/ACT AEPB, TAKE 1 PUFF BY MOUTH TWICE A DAY (Patient taking differently: Inhale 1 puff into the lungs in the morning and at bedtime.), Disp: 60 each, Rfl: 5   amoxicillin (AMOXIL) 500 MG tablet, Take 2 tablets (1,000 mg total) by mouth 2 (two) times daily. Resume taking 6/21 PM after linezolid course completed, Disp: 120 tablet, Rfl: 11   Review of Systems  Constitutional:  Negative for activity change, appetite change, chills, diaphoresis, fatigue, fever and unexpected weight change.  HENT:  Negative for congestion, rhinorrhea, sinus pressure, sneezing, sore throat and trouble swallowing.   Eyes:  Negative for photophobia and visual disturbance.  Respiratory:  Negative for cough, chest tightness, shortness of breath, wheezing and stridor.   Cardiovascular:  Negative for chest pain, palpitations and leg swelling.  Gastrointestinal:  Negative for abdominal distention, abdominal pain, anal bleeding, blood in stool, constipation, diarrhea, nausea and vomiting.  Genitourinary:  Negative for difficulty urinating, dysuria, flank pain and hematuria.  Musculoskeletal:  Negative for arthralgias, back pain, gait problem, joint swelling and myalgias.  Skin:  Negative for color change, pallor, rash and wound.  Neurological:  Negative for dizziness, tremors, weakness and light-headedness.  Hematological:  Negative for adenopathy. Does not bruise/bleed easily.  Psychiatric/Behavioral:  Negative for agitation, behavioral problems, confusion, decreased concentration, dysphoric mood and sleep disturbance.        Objective:   Physical Exam Constitutional:      Appearance: He is well-developed.  HENT:     Head: Normocephalic and atraumatic.  Eyes:     Conjunctiva/sclera: Conjunctivae normal.  Cardiovascular:     Rate and Rhythm: Normal rate and  regular rhythm.  Pulmonary:     Effort: Pulmonary effort is normal. No respiratory distress.     Breath sounds: No wheezing.  Abdominal:     General: There is no distension.     Palpations: Abdomen is soft.  Musculoskeletal:        General: No tenderness.     Cervical back: Normal range of motion and neck supple.  Skin:    General: Skin is warm and dry.     Coloration: Skin is not pale.     Findings: No erythema or rash.  Neurological:     General: No focal deficit present.     Mental Status: He is alert and oriented to person, place, and time.  Psychiatric:        Mood and Affect: Mood normal.        Behavior: Behavior normal.  Thought Content: Thought content normal.        Judgment: Judgment normal.       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 Sharol Given   Vaccine counseling his wife and he asked about RSV vaccination which I was in favor of and also with regards to COVID-19 updated vaccine which I also recommended

## 2022-02-20 ENCOUNTER — Ambulatory Visit (INDEPENDENT_AMBULATORY_CARE_PROVIDER_SITE_OTHER): Payer: Medicare Other | Admitting: Orthopedic Surgery

## 2022-02-20 ENCOUNTER — Encounter: Payer: Self-pay | Admitting: Orthopedic Surgery

## 2022-02-20 DIAGNOSIS — L97521 Non-pressure chronic ulcer of other part of left foot limited to breakdown of skin: Secondary | ICD-10-CM | POA: Diagnosis not present

## 2022-02-20 DIAGNOSIS — L97811 Non-pressure chronic ulcer of other part of right lower leg limited to breakdown of skin: Secondary | ICD-10-CM

## 2022-02-20 NOTE — Progress Notes (Signed)
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  Of note patient states this past weekend he felt chills his maximum temperature was 100.5.  Patient states the fever resolved and he is afebrile at this time.  Patient denies any respiratory or urinary symptoms.   Assessment & Plan: Visit Diagnoses:  1. Non-pressure chronic ulcer of other part of left foot limited to breakdown of skin ([place])     Plan: Recommended patient follow up with infectious disease.  Patient does have a history of previously infected implantable defibrillator.  Follow-Up Instructions: Return in about 4 weeks (around 03/20/2022).   Ortho Exam  [name], oriented, no adenopathy, well-dressed, normal affect, normal respiratory effort. Examination patient does have a superficial venous ulcer on the right leg that is 3 cm in diameter with healthy granulation tissue there is no signs of infection in either leg.  Examination of the left foot he has a Wagner grade 1 ulcer which continues to show slow steady improvement.  After informed consent a 10 blade knife was used to debride the skin and soft tissue back to healthy viable granulation tissue.  Silver nitrate was used for hemostasis the ulcer was 10 mm in diameter and 2 mm deep after debridement.  Patient was placed back in his knee-high compression stockings.  Imaging: No results found. No images are attached to the encounter.  Labs: Lab  Results  Component Value Date   HGBA1C 5.6 02/10/2022   HGBA1C 5.5 02/10/2022   HGBA1C 5.5 02/10/2022   HGBA1C 5.5 (A) 02/10/2022   HGBA1C 5.5 02/10/2022   ESRSEDRATE 30 (H) 12/15/2021   ESRSEDRATE 34 (H) 11/24/2020   ESRSEDRATE 38 (H) 07/14/2020   CRP 1.3 11/24/2020   CRP 0.9 07/14/2020   CRP 0.7 05/03/2020   LABURIC 3.5 (L) 09/10/2013   REPTSTATUS 12/15/2021 FINAL 12/14/2021   CULT  12/14/2021    NO GROWTH Performed at [facility], [place] [address], [place], [place] [address]    LABORGA ENTEROCOCCUS FAECALIS 09/11/2019     Lab Results  Component Value Date   ALBUMIN 2.8 (L) 12/15/2021   ALBUMIN 3.2 (L) 12/14/2021   ALBUMIN 3.4 (L) 10/13/2021    Lab Results  Component Value Date   MG 1.7 05/07/2020   MG 1.6 (L) 05/06/2020   MG 1.8 05/05/2020   No results found for: "VD25OH"  No results found for: "PREALBUMIN"    Latest Ref Rng & Units 02/10/2022    4:15 PM 12/27/2021    9:35 AM 12/20/2021   12:40 PM  CBC EXTENDED  WBC 3.8 - 10.8 Thousand/uL 5.1  4.7  4.9   RBC 4.20 - 5.80 Million/uL 3.66  3.52  3.72   Hemoglobin 13.2 - 17.1 g/dL 11.3  11.0  11.7  HCT 38.5 - 50.0 % 33.7  32.9  33.7   Platelets 140 - 400 Thousand/uL 124  76  121.0   NEUT# 1,500 - 7,800 cells/uL  2,355  2.9   Lymph# 850 - 3,900 cells/uL  1,612  1.3      There is no height or weight on file to calculate BMI.  Orders:  No orders of the defined types were placed in this encounter.  No orders of the defined types were placed in this encounter.    Procedures: No procedures performed  Clinical Data: No additional findings.  ROS:  All other systems negative, except as noted in the HPI. Review of Systems  Objective: Vital Signs: There were no vitals taken for this visit.  Specialty Comments:  No specialty comments available.  PMFS History: Patient Active Problem List   Diagnosis Date Noted   Venous stasis dermatitis 12/27/2021   Cellulitis 12/14/2021   Wound of right lower  extremity 12/14/2021   Transient hypotension 12/14/2021   Heart failure with preserved ejection fraction ([place]) 12/14/2021   Prediabetes 12/14/2021   Thrombocytopenia ([place]) 12/14/2021   Obesity, Class III, BMI 40-49.9 (morbid obesity) ([place]) 12/14/2021   BPH (benign prostatic hyperplasia) 12/14/2021   Vaccine counseling 11/16/2021   Weakness 02/23/2021   Physical deconditioning 02/23/2021   Rhinitis 07/14/2020   Mitral valve vegetation 05/28/2020   Non-pressure chronic ulcer of other part of left foot limited to breakdown of skin ([place])    Esophageal dysphagia    Bacteremia    Endocarditis of mitral valve    Fever 04/28/2020   Cervical lymphadenopathy 03/15/2020   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   ICD (implantable cardioverter-defibrillator) infection ([place]) 12/22/2019   Gram-positive bacteremia 11/27/2019   FUO (fever of unknown origin) 11/26/2019   Low blood pressure reading 11/05/2019   Wound dehiscence 10/22/2019   PFO (patent foramen ovale) 10/06/2019   ICD (implantable cardioverter-defibrillator) battery depletion    Pressure injury of skin 09/13/2019   Enterococcal bacteremia history  09/13/2019   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   Urinary retention 09/13/2019   Phimosis 09/13/2019   SIRS (systemic inflammatory response syndrome) ([place]) 09/12/2019   Sepsis ([place]) 09/12/2019   Enterococcus UTI 09/12/2019   Cellulitis of foot    History of transmetatarsal amputation of left foot ([place])    Subacute osteomyelitis, left ankle and foot ([place])    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 ([place]) 04/17/2018   Diabetic neuropathy, painful ([place]) 12/26/2016   Neuropathic pain of foot, left 06/20/2016   Insulin resistance 07/22/2015   Status post amputation of left great toe ([place]) 06/08/2015   Spinal stenosis of lumbar region  02/16/2015   Hereditary and idiopathic peripheral neuropathy 01/12/2015   Oral thrush 12/28/2014   Orthostatic dizziness 05/24/2014   Paresthesias with subjective weakness 01/30/2014   Bilateral thigh pain 01/30/2014   Abdominal wall pain in right upper quadrant 01/22/2014   Gross hematuria 09/25/2013   Intertrigo 09/25/2013   IBS (irritable bowel syndrome) 09/11/2013   Chronic pain syndrome 08/06/2013   CAD (coronary artery disease), native coronary artery 12/25/2012   Paroxysmal atrial fibrillation ([place]) 12/25/2012   Moderate aortic stenosis 12/25/2012   Aortic stenosis 12/24/2012   Personal history of colonic adenoma 10/30/2012   Diverticulosis of colon (without mention of hemorrhage) 10/30/2012   Internal hemorrhoids 10/30/2012   Change in bowel habits intermittent loose stools 10/30/2012   Routine health  maintenance 06/09/2012   Hereditary peripheral neuropathy 10/10/2011   Long term current use of anticoagulant - apixiban 08/16/2010   Dyspnea on exertion 09/08/2009   HYPERCHOLESTEROLEMIA-PURE 01/13/2009   Essential hypertension, benign 01/13/2009   EDEMA 01/13/2009   GOUT 01/12/2009   GLAUCOMA 01/12/2009   Venous insufficiency (chronic) (peripheral) 01/12/2009   COPD (chronic obstructive pulmonary disease) ([place]) 01/12/2009   VENTRAL HERNIA 01/12/2009   OSTEOARTHRITIS 01/12/2009   ARTHRITIS 01/12/2009   DEGENERATIVE DISC DISEASE 01/12/2009   CATARACT, HX OF 01/12/2009   HEART MURMUR, HX OF 01/12/2009   BENIGN PROSTATIC HYPERTROPHY, HX OF 01/12/2009   Obesity 01/17/2007   OBSTRUCTIVE SLEEP APNEA 01/17/2007   ASTHMA 01/17/2007   GERD 01/17/2007   HALLUX VALGUS, ACQUIRED 01/17/2007   Pulmonary hypertension ([place]) 01/17/2007   Past Medical History:  Diagnosis Date   AICD (automatic cardioverter/defibrillator) present    Balanitis    +severe phimosis+ buried penis->circ not possible but dorsal slit done 10/2019   BPH (benign prostatic hypertrophy)    with urinary  retention.  Renal u/s 12/04/19 NORMAL KIDNEYS, NORMAL BLADDER, NO HYDRONEPHROSIS   CAD (coronary artery disease)    Nonobstructive by 12/2012 cath; then 03/2016 he required BMS to RCA ([facility]).  In-stent restenosis on cath 11/01/16, baloon angioplasty successful.   CATARACT, HX OF    Chronic combined systolic and diastolic CHF (congestive heart failure) ([place]) 05/2016   Ischemic CM.  04/2018 EF 40-45%, grd I DD.  07/2021 EF 55-60%, AV well seated   Chronic pain syndrome    Lumbar DDD; chronic neuropathic pain (DM); has spinal stimulator and sees pain mgmt MD   Complicated UTI (urinary tract infection) 09/2019   Phimoses, acute urinary retention, entoerococcus UTI, enterococc bacteremia.  F/u blood clx's neg x 5d.     COPD    Debilitated patient    Diabetes mellitus type 2 with complications ([place])    ZYS0Y jumped from 5.7% to 6.1% 03/2015---started metformin at that time.  DM 2 dx by fasting gluc criteria 2018.  Has chronic neuropath pain   Enterococcal bacteremia 09/2019   Phimoses, acute urinary retention, entoerococcus UTI, enterococc bacteremia.  F/u blood clx's neg x 5d.     Essential hypertension, benign    Fatty liver 2007   2007 u/s showed fatty liver with hepatosplenomegaly.  2019 repeat u/s->fatty liver but no cirrhosis or hepatosplenomegaly.   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 Tommy Medal)   Lumbosacral neuritis    Lumbosacral spondylosis    Lumbar spinal stenosis with neurogenic claudication--contributes to his chronic pain syndrome   Morbid obesity ([place])    Normal memory function 08/2014   Neuropsychological testing  ([facility]): no cognitive impairment or sign of neurodegenerative disorder.  Likely has adjustment d/o with mixed anxiety/depressed features and may benefit from low dose SNRI.     Normocytic anemia 03/2016   Mild-pt needs ferritin and vit B12 level checked (as of 03/22/16). Hb stable 09/2019   NSTEMI (non-ST elevated myocardial infarction) ([place]) 03/20/2016   BMS to RCA   Obesity hypoventilation syndrome ([facility])    Orthostatic hypotension    OSA on CPAP  8 cm H2O   OSTEOARTHRITIS    Paroxysmal atrial fibrillation ([place]) 2003    (? chronic?) Off anticoag for a while due to falls.  Then apixaban started 12/2014.   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 Ella Bodo): neuropathic pain->diff to treat, failed nucynta, failed spinal stimul trial, oxycontin hs + tramadol + gabap as of 12/2017 f/u Dr. Letta Pate.   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   Sick sinus syndrome ([facility])    PPM placed   Thrombocytopenia ([employer]) 2018   HSM on 2007 abd u/s---suspect some mild splenic sequestration chronically.   Unspecified glaucoma(365.9)    Unspecified hereditary and idiopathic peripheral neuropathy approx age 32   bilat LE's, ? left arm, too.  Feet became progressively numb + left  foot pain intermittently.  Pt may be trying a spinal stimulator (as of 05/2015)   Vaccine counseling 11/16/2021   VENOUS INSUFFICIENCY    Being followed by Dr. Sharol Given as of 10/2016 for two R LL venous stasis ulcers/skin tears.  Healed as of 10/30/16 f/u with Dr. Sharol Given.   Venous stasis dermatitis 12/27/2021   VENTRAL HERNIA     Family History  Problem Relation Age of Onset   Hypertension Mother    Coronary artery disease Mother    Heart attack Mother    Neuropathy Mother    Pulmonary fibrosis Father        asbestosis    Past Surgical History:  Procedure Laterality Date   AMPUTATION Left 04/11/2013   Procedure: AMPUTATION DIGIT Left 3rd toe;  Surgeon: [name], MD;  Location: [facility];  Service: Orthopedics;  Laterality: Left;  Left 3rd toe amputation at [facility]   AMPUTATION Left 08/29/2019   Procedure: LEFT TRANSMETATARSAL AMPUTATION;  Surgeon: [name], MD;  Location: [facility];  Service: Orthopedics;  Laterality: Left;   BIOPSY  05/04/2020   Procedure: BIOPSY;  Surgeon: Jackquline Denmark, MD;  Location: [facility] ENDOSCOPY;  Service: Endoscopy;;   [place]; 03/10/16   1997 Non-obstructive disease.  03/2016 BMS to RCA, with 25% pDiag dz, o/w normal cors per cath 03/07/16.  Cath 11/01/16: in stent restenosis, successful baloon angioplasty. 08/2020 branch vessel dz, cont medical therapy rec'd.   CARDIAC CATHETERIZATION  12/24/2012   mild < 20% LCx, prox 30% RCA; LVEF 55-65% , moderate pulmonary HTN, moderate AS   CARDIAC DEFIBRILLATOR PLACEMENT  11/07/2017   [facility] MRI Quad CRT defibrillator   CARDIOVASCULAR STRESS TEST  05/11/16 ([facility])   2017 Myocardial perfusion imaging:  No ischemia; scar in apex, global hypokinesis, EF 36%.  06/15/20->mod primarily fixed inferolat wall defect mildly worse with stress c/w infarct/scar with mild peri-infarct ischemia, normal EF-->for cath per cards.   Carotid dopplers  03/09/2016   [facility]: no hemodynamically  significant stenosis on either side.    CHOLECYSTECTOMY     COLONOSCOPY [name] 10/30/2012   Procedure: COLONOSCOPY;  Surgeon: [name], MD;  Location: [facility] ENDOSCOPY;  Service: Endoscopy;  Laterality: [name];   CORONARY ANGIOPLASTY WITH STENT PLACEMENT  03/2016; 04/2017   2017-[facility]: BMS to RCA-pt was placed on Brilinta.  04/2017: DES to RCA.   DORSAL SLIT [name] 10/29/2019   for severe phimosis. Procedure: DORSAL SLIT;  Surgeon: Alexis Frock, MD;  Location: [facility] ORS;  Service: Urology;  Laterality: [name];  [place]  05/04/2020   Procedure: ESOPHAGEAL DILATION;  Surgeon: Jackquline Denmark, MD;  Location: [facility] ENDOSCOPY;  Service: Endoscopy;;   ESOPHAGOGASTRODUODENOSCOPY (EGD) WITH PROPOFOL [name] 05/04/2020   Procedure: ESOPHAGOGASTRODUODENOSCOPY (EGD) WITH PROPOFOL;  Surgeon: Jackquline Denmark, MD;  Location: [facility] ENDOSCOPY;  Service: Endoscopy;  Laterality: [name];   EYE SURGERY Bilateral cataract   HEMORRHOID SURGERY     INTRAOCULAR LENS INSERTION Bilateral    KNEE SURGERY Right    LEFT AND RIGHT HEART CATHETERIZATION WITH CORONARY ANGIOGRAM [name] 12/24/2012   Procedure: LEFT AND RIGHT HEART CATHETERIZATION WITH CORONARY ANGIOGRAM;  Surgeon: [name], MD;  Location: [facility] CATH LAB;  Service: Cardiovascular;  Laterality: [name];   LEG SURGERY Bilateral    lenghtening    PACEMAKER PLACEMENT  04/13/2016   2nd deg HB after TAVR, pt had DC MDT PPM placed.   SHOULDER ARTHROSCOPY  08/30/2011   Procedure: ARTHROSCOPY SHOULDER;  Surgeon: [name], MD;  Location: [facility];  Service: Orthopedics;  Laterality: Right;  Right Shoulder Arthroscopy, Debridement, and Decompression   SPINAL CORD STIMULATOR INSERTION [name] 09/10/2015   Procedure: LUMBAR SPINAL CORD STIMULATOR INSERTION;  Surgeon: [name], MD;  Location: [facility] NEURO ORS;  Service: Neurosurgery;  Laterality: [name];   TEE WITHOUT CARDIOVERSION [name] 09/16/2019   Procedure: TRANSESOPHAGEAL ECHOCARDIOGRAM (TEE);  Surgeon: Skeet Latch, MD;  Location: [name] ENDOSCOPY;  Service:  Cardiovascular;  Laterality: [name];   TEE WITHOUT CARDIOVERSION [name] 12/02/2019   +vegetation on AVR and pacer lead in RV.  EF 55-60%, normal wall motion.  Valves function normal.  Procedure: TRANSESOPHAGEAL ECHOCARDIOGRAM (TEE);  Surgeon: Geralynn Rile, MD;  Location: [facility];  Service: Cardiovascular;  Laterality: [name];   TOE AMPUTATION Left    due to osteomyelitis.  R big toe surg due to osteoarth   TONSILLECTOMY     traeculectomy Left    eye   TRANSCATHETER AORTIC VALVE REPLACEMENT, TRANSFEMORAL  04/11/2016   TRANSESOPHAGEAL ECHOCARDIOGRAM  03/09/2016; 09/2019   [facility]: EF 55-60%, PFO seen with bi-directional shunting, no thrombus in appendage.  09/2019 ->no valvular vegetations. Small patent foramen ovale with predominantly left to right shunting across the interatrial septum.   TRANSTHORACIC ECHOCARDIOGRAM  01/2015; 01/2016; 05/18/16; 09/18/16, 05/2017, 08/2017   01/2015 No signif change in aortic stenosis (moderate).  01/2016 Severe LVH w/small LV cavity, EF 60-65%, grade I diast dysfxn.  05/2016 (s/p TAVR): EF 50-55%, grd I DD, biopros AV good.  08/2016--EF 50-55%, LV septal motion c/w conduction abnl, grd I DD,mild MS,bioprosth aortic valve well seated, w/trace AR. 05/2017 TTE EF 35%. 08/2017-EF 35%, mod diff hypokin LV, grd I DD, biopros AV good.    TRANSTHORACIC ECHOCARDIOGRAM  04/2018; 09/2019   04/2018: EF 40-45%, mod diffuse LV hypokin, grd I DD, bioprosth AV well seated, no AS or AR. 09/2019 EF 60-65%, grd I DD, valves fine, including bioprosth AV. 04/29/20 (tech diff) EF 55-60%, grd I DD, vegetation on MV.  07/2021 EF 55-60% (s/p  upgrade to BiV PPM), AV well seated.   VITRECTOMY     Social History   Occupational History   Occupation: Engineer, production    Comment: retired  Tobacco Use   Smoking status: Never   Smokeless tobacco: Never  Scientific laboratory technician Use: Never used  Substance and Sexual Activity   Alcohol use: No    Alcohol/week: 0.0 standard drinks of alcohol   Drug use: No     Types: Oxycodone   Sexual activity: Not Currently

## 2022-02-21 ENCOUNTER — Telehealth: Payer: Self-pay

## 2022-02-21 NOTE — Telephone Encounter (Signed)
Patient called and left voicemail saying that last Thursday 8/17 his temperature reached a peak of 100.5 degrees for about 3 hours and resolved after taking a Tylenol. His only other symptom was that he felt cold all over.   He says he saw his ortho doctor yesterday who recommended he call ID to report the fever.   Called patient back, the fever was an isolated event and his temperature has been normal since taking the Tylenol. He denies any other symptoms.   Advised that an isolated low grade fever is not necessarily concerning, especially since he has not had a recurrence.   Asked him to please call if he develops another fever or chills, but otherwise we will keep follow up for November.   Beryle Flock, RN

## 2022-02-22 NOTE — Telephone Encounter (Signed)
Spoke with Tim [name], asked him to please call if he develops a fever of 101 or higher. Patient verbalized understanding and has no further questions.   Beryle Flock, RN

## 2022-03-03 ENCOUNTER — Other Ambulatory Visit: Payer: Self-pay | Admitting: Family Medicine

## 2022-03-03 ENCOUNTER — Other Ambulatory Visit (INDEPENDENT_AMBULATORY_CARE_PROVIDER_SITE_OTHER): Payer: Medicare Other

## 2022-03-03 DIAGNOSIS — D509 Iron deficiency anemia, unspecified: Secondary | ICD-10-CM

## 2022-03-03 LAB — POC HEMOCCULT BLD/STL (HOME/3-CARD/SCREEN)
Card #2 Fecal Occult Blod, POC: NEGATIVE
Card #3 Fecal Occult Blood, POC: NEGATIVE
Fecal Occult Blood, POC: NEGATIVE

## 2022-03-07 ENCOUNTER — Encounter: Payer: Self-pay | Admitting: Family Medicine

## 2022-03-19 ENCOUNTER — Other Ambulatory Visit: Payer: Self-pay | Admitting: Family Medicine

## 2022-03-21 ENCOUNTER — Ambulatory Visit (INDEPENDENT_AMBULATORY_CARE_PROVIDER_SITE_OTHER): Payer: Medicare Other | Admitting: Orthopedic Surgery

## 2022-03-21 ENCOUNTER — Encounter: Payer: Self-pay | Admitting: Orthopedic Surgery

## 2022-03-21 DIAGNOSIS — Z89432 Acquired absence of left foot: Secondary | ICD-10-CM | POA: Diagnosis not present

## 2022-03-21 DIAGNOSIS — L97521 Non-pressure chronic ulcer of other part of left foot limited to breakdown of skin: Secondary | ICD-10-CM | POA: Diagnosis not present

## 2022-03-21 NOTE — Progress Notes (Signed)
Office Visit Note   Patient: Tim [name]           Date of Birth: 1940/10/14           MRN: [id number] Visit Date: 03/21/2022              Requested by: Tammi Sou, MD [address] [place],  [place] [address] PCP: Tammi Sou, MD  Chief Complaint  Patient presents with   Left Foot - Follow-up    Hx transmet  amp       HPI: Patient is an 81 year old gentleman who presents follow-up for [facility] grade 1 ulcer left transmetatarsal amputation.  Patient states he does have a follow-up with infectious disease.  He has a history of an infected implantable defibrillator he states that there is increased risk of changing out the defibrillator and the battery is only going to last for a year and a half.  Assessment & Plan: Visit Diagnoses:  1. Non-pressure chronic ulcer of other part of left foot limited to breakdown of skin ([place])   2. History of transmetatarsal amputation of left foot ([place])     Plan: Continue routine wound care continue with his compression socks protective shoe wear.  Follow-Up Instructions: No follow-ups on file.   Ortho Exam  [name], oriented, no adenopathy, well-dressed, normal affect, normal respiratory effort. Examination patient has no ascending cellulitis no drainage.  He has a Wagner grade 1 ulcer over the residual transmetatarsal amputation.  After informed consent a 10 blade knife was used to debride the skin and soft tissue back to healthy viable bleeding granulation tissue there is no exposed bone or tendon there is no tunneling no abscess.  After debridement the ulcer is 3 cm in diameter and 3 mm deep.  Imaging: No results found. No images are attached to the encounter.  Labs: Lab Results  Component Value Date   HGBA1C 5.6 02/10/2022   HGBA1C 5.5 02/10/2022   HGBA1C 5.5 02/10/2022   HGBA1C 5.5 (A) 02/10/2022   HGBA1C 5.5 02/10/2022   ESRSEDRATE 30 (H) 12/15/2021   ESRSEDRATE 34 (H) 11/24/2020   ESRSEDRATE 38 (H)  07/14/2020   CRP 1.3 11/24/2020   CRP 0.9 07/14/2020   CRP 0.7 05/03/2020   LABURIC 3.5 (L) 09/10/2013   REPTSTATUS 12/15/2021 FINAL 12/14/2021   CULT  12/14/2021    NO GROWTH Performed at [facility], [address], [place],  [address]    LABORGA ENTEROCOCCUS FAECALIS 09/11/2019     Lab Results  Component Value Date   ALBUMIN 2.8 (L) 12/15/2021   ALBUMIN 3.2 (L) 12/14/2021   ALBUMIN 3.4 (L) 10/13/2021    Lab Results  Component Value Date   MG 1.7 05/07/2020   MG 1.6 (L) 05/06/2020   MG 1.8 05/05/2020   No results found for: "VD25OH"  No results found for: "PREALBUMIN"    Latest Ref Rng & Units 02/10/2022    4:15 PM 12/27/2021    9:35 AM 12/20/2021   12:40 PM  CBC EXTENDED  WBC 3.8 - 10.8 Thousand/uL 5.1  4.7  4.9   RBC 4.20 - 5.80 Million/uL 3.66  3.52  3.72   Hemoglobin 13.2 - 17.1 g/dL 11.3  11.0  11.7   HCT 38.5 - 50.0 % 33.7  32.9  33.7   Platelets 140 - 400 Thousand/uL 124  76  121.0   NEUT# 1,500 - 7,800 cells/uL  2,355  2.9   Lymph# 850 -  3,900 cells/uL  1,612  1.3      There is no height or weight on file to calculate BMI.  Orders:  No orders of the defined types were placed in this encounter.  No orders of the defined types were placed in this encounter.    Procedures: No procedures performed  Clinical Data: No additional findings.  ROS:  All other systems negative, except as noted in the HPI. Review of Systems  Objective: Vital Signs: There were no vitals taken for this visit.  Specialty Comments:  No specialty comments available.  PMFS History: Patient Active Problem List   Diagnosis Date Noted   Venous stasis dermatitis 12/27/2021   Cellulitis 12/14/2021   Wound of right lower extremity 12/14/2021   Transient hypotension 12/14/2021   Heart failure with preserved ejection fraction ([place]) 12/14/2021   Prediabetes 12/14/2021   Thrombocytopenia ([place]) 12/14/2021   Obesity, Class III, BMI 40-49.9 (morbid obesity) ([place])  12/14/2021   BPH (benign prostatic hyperplasia) 12/14/2021   Vaccine counseling 11/16/2021   Weakness 02/23/2021   Physical deconditioning 02/23/2021   Rhinitis 07/14/2020   Mitral valve vegetation 05/28/2020   Non-pressure chronic ulcer of other part of left foot limited to breakdown of skin ([place])    Esophageal dysphagia    Bacteremia    Endocarditis of mitral valve    Fever 04/28/2020   Cervical lymphadenopathy 03/15/2020   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   ICD (implantable cardioverter-defibrillator) infection ([place]) 12/22/2019   Gram-positive bacteremia 11/27/2019   FUO (fever of unknown origin) 11/26/2019   Low blood pressure reading 11/05/2019   Wound dehiscence 10/22/2019   PFO (patent foramen ovale) 10/06/2019   ICD (implantable cardioverter-defibrillator) battery depletion    Pressure injury of skin 09/13/2019   Enterococcal bacteremia history  09/13/2019   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   Urinary retention 09/13/2019   Phimosis 09/13/2019   SIRS (systemic inflammatory response syndrome) ([place]) 09/12/2019   Sepsis ([place]) 09/12/2019   Enterococcus UTI 09/12/2019   Cellulitis of foot    History of transmetatarsal amputation of left foot ([place])    Subacute osteomyelitis, left ankle and foot ([place])    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 ([place]) 04/17/2018   Diabetic neuropathy, painful ([place]) 12/26/2016   Neuropathic pain of foot, left 06/20/2016   Insulin resistance 07/22/2015   Status post amputation of left great toe ([place]) 06/08/2015   Spinal stenosis of lumbar region 02/16/2015   Hereditary and idiopathic peripheral neuropathy 01/12/2015   Oral thrush 12/28/2014   Orthostatic dizziness 05/24/2014   Paresthesias with subjective weakness 01/30/2014   Bilateral thigh pain 01/30/2014   Abdominal wall pain in  right upper quadrant 01/22/2014   Gross hematuria 09/25/2013   Intertrigo 09/25/2013   IBS (irritable bowel syndrome) 09/11/2013   Chronic pain syndrome 08/06/2013   CAD (coronary artery disease), native coronary artery 12/25/2012   Paroxysmal atrial fibrillation ([place]) 12/25/2012   Moderate aortic stenosis 12/25/2012   Aortic stenosis 12/24/2012   Personal history of colonic adenoma 10/30/2012   Diverticulosis of colon (without mention of hemorrhage) 10/30/2012   Internal hemorrhoids 10/30/2012   Change in bowel habits intermittent loose stools 10/30/2012   Routine health maintenance 06/09/2012   Hereditary peripheral neuropathy 10/10/2011   Long term current use of anticoagulant - apixiban 08/16/2010   Dyspnea on exertion 09/08/2009   HYPERCHOLESTEROLEMIA-PURE 01/13/2009   Essential hypertension, benign 01/13/2009   EDEMA  01/13/2009   GOUT 01/12/2009   GLAUCOMA 01/12/2009   Venous insufficiency (chronic) (peripheral) 01/12/2009   COPD (chronic obstructive pulmonary disease) ([place]) 01/12/2009   VENTRAL HERNIA 01/12/2009   OSTEOARTHRITIS 01/12/2009   ARTHRITIS 01/12/2009   DEGENERATIVE DISC DISEASE 01/12/2009   CATARACT, HX OF 01/12/2009   HEART MURMUR, HX OF 01/12/2009   BENIGN PROSTATIC HYPERTROPHY, HX OF 01/12/2009   Obesity 01/17/2007   OBSTRUCTIVE SLEEP APNEA 01/17/2007   ASTHMA 01/17/2007   GERD 01/17/2007   HALLUX VALGUS, ACQUIRED 01/17/2007   Pulmonary hypertension ([place]) 01/17/2007   Past Medical History:  Diagnosis Date   AICD (automatic cardioverter/defibrillator) present    Balanitis    +severe phimosis+ buried penis->circ not possible but dorsal slit done 10/2019   BPH (benign prostatic hypertrophy)    with urinary retention.  Renal u/s 12/04/19 NORMAL KIDNEYS, NORMAL BLADDER, NO HYDRONEPHROSIS   CAD (coronary artery disease)    Nonobstructive by 12/2012 cath; then 03/2016 he required BMS to RCA ([facility]).  In-stent restenosis on cath 11/01/16, baloon angioplasty  successful.   CATARACT, HX OF    Chronic combined systolic and diastolic CHF (congestive heart failure) ([place]) 05/2016   Ischemic CM.  04/2018 EF 40-45%, grd I DD.  07/2021 EF 55-60%, AV well seated   Chronic pain syndrome    Lumbar DDD; chronic neuropathic pain (DM); has spinal stimulator and sees pain mgmt MD   Complicated UTI (urinary tract infection) 09/2019   Phimoses, acute urinary retention, entoerococcus UTI, enterococc bacteremia.  F/u blood clx's neg x 5d.     COPD    Debilitated patient    Diabetes mellitus type 2 with complications ([place])    PYK9X jumped from 5.7% to 6.1% 03/2015---started metformin at that time.  DM 2 dx by fasting gluc criteria 2018.  Has chronic neuropath pain   Enterococcal bacteremia 09/2019   Phimoses, acute urinary retention, entoerococcus UTI, enterococc bacteremia.  F/u blood clx's neg x 5d.     Essential hypertension, benign    Fatty liver 2007   2007 u/s showed fatty liver with hepatosplenomegaly.  2019 repeat u/s->fatty liver but no cirrhosis or hepatosplenomegaly.   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 Tommy Medal)   Lumbosacral neuritis    Lumbosacral spondylosis    Lumbar spinal stenosis with neurogenic claudication--contributes to his chronic pain syndrome   Morbid obesity ([place])    Normal memory function 08/2014   Neuropsychological testing ([facility]): no cognitive impairment or sign of neurodegenerative disorder.  Likely has adjustment d/o with mixed anxiety/depressed features and may benefit from low dose SNRI.     Normocytic anemia 03/2016   Mild-pt needs ferritin  and vit B12 level checked (as of 03/22/16). Hb stable 09/2019.  Hemoccult x 3 NEG 03/2022   NSTEMI (non-ST elevated myocardial infarction) ([place]) 03/20/2016   BMS to RCA   Obesity hypoventilation syndrome (HCC)    Orthostatic hypotension    OSA on CPAP    8 cm H2O   OSTEOARTHRITIS    Paroxysmal atrial fibrillation ([place]) 2003    (? chronic?) Off anticoag for a while due to falls.  Then  apixaban started 12/2014.   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 Ella Bodo): neuropathic pain->diff to treat, failed nucynta, failed spinal stimul trial, oxycontin hs + tramadol + gabap as of 12/2017 f/u Dr. Letta Pate.   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   Sick sinus syndrome (HCC)    PPM placed   Thrombocytopenia ([place]) 2018   HSM on 2007 abd u/s---suspect some mild splenic sequestration chronically.   Unspecified glaucoma(365.9)    Unspecified hereditary and idiopathic peripheral neuropathy approx age 36   bilat LE's, ? left arm, too.  Feet became progressively numb + left foot pain intermittently.  Pt may be trying a spinal stimulator (as of 05/2015)   Vaccine counseling 11/16/2021   VENOUS INSUFFICIENCY    Being followed by Dr. Sharol Given as of 10/2016 for two R LL venous stasis ulcers/skin  tears.  Healed as of 10/30/16 f/u with Dr. Sharol Given.   Venous stasis dermatitis 12/27/2021   VENTRAL HERNIA     Family History  Problem Relation Age of Onset   Hypertension Mother    Coronary artery disease Mother    Heart attack Mother    Neuropathy Mother    Pulmonary fibrosis Father        asbestosis    Past Surgical History:  Procedure Laterality Date   AMPUTATION Left 04/11/2013   Procedure: AMPUTATION DIGIT Left 3rd toe;  Surgeon: [name], MD;  Location: [facility];  Service: Orthopedics;  Laterality: Left;  Left 3rd toe amputation at [facility]   AMPUTATION Left 08/29/2019   Procedure: LEFT TRANSMETATARSAL AMPUTATION;  Surgeon: [name], MD;  Location: [facility];  Service: Orthopedics;  Laterality: Left;   BIOPSY  05/04/2020   Procedure: BIOPSY;  Surgeon: Jackquline Denmark, MD;  Location: [facility] ENDOSCOPY;  Service: Endoscopy;;   [place]; 03/10/16   1997 Non-obstructive disease.  03/2016 BMS to RCA, with 25% pDiag dz, o/w normal cors per cath 03/07/16.  Cath 11/01/16: in stent restenosis, successful baloon angioplasty. 08/2020 branch vessel dz, cont medical therapy rec'd.   CARDIAC CATHETERIZATION  12/24/2012   mild < 20% LCx, prox 30% RCA; LVEF 55-65% , moderate pulmonary HTN, moderate AS   CARDIAC DEFIBRILLATOR PLACEMENT  11/07/2017   [facility] MRI Quad CRT defibrillator   CARDIOVASCULAR STRESS TEST  05/11/16 ([facility])   2017 Myocardial perfusion imaging:  No ischemia; scar in apex, global hypokinesis, EF 36%.  06/15/20->mod primarily fixed inferolat wall defect mildly worse with stress c/w infarct/scar with mild peri-infarct ischemia, normal EF-->for cath per cards.   Carotid dopplers  03/09/2016   [facility]: no hemodynamically significant stenosis on either side.   CHOLECYSTECTOMY     COLONOSCOPY [name] 10/30/2012   Procedure: COLONOSCOPY;  Surgeon: [name], MD;  Location: [facility] ENDOSCOPY;  Service: Endoscopy;  Laterality: [name];   CORONARY ANGIOPLASTY WITH STENT PLACEMENT  03/2016;  04/2017   2017-[facility]: BMS to RCA-pt was placed on Brilinta.  04/2017: DES to RCA.   DORSAL SLIT [name] 10/29/2019   for severe phimosis. Procedure: DORSAL SLIT;  Surgeon: Alexis Frock, MD;  Location: [facility] ORS;  Service: Urology;  Laterality: [name];  [place]  05/04/2020   Procedure: ESOPHAGEAL DILATION;  Surgeon: Jackquline Denmark, MD;  Location: [facility] ENDOSCOPY;  Service: Endoscopy;;   ESOPHAGOGASTRODUODENOSCOPY (EGD) WITH PROPOFOL [name] 05/04/2020   Procedure: ESOPHAGOGASTRODUODENOSCOPY (EGD) WITH PROPOFOL;  Surgeon: Jackquline Denmark, MD;  Location: [facility] ENDOSCOPY;  Service: Endoscopy;  Laterality: [name];   EYE SURGERY Bilateral cataract   HEMORRHOID SURGERY     INTRAOCULAR LENS INSERTION Bilateral    KNEE SURGERY Right    LEFT AND RIGHT HEART CATHETERIZATION WITH CORONARY ANGIOGRAM [name] 12/24/2012   Procedure: LEFT AND RIGHT HEART CATHETERIZATION WITH CORONARY ANGIOGRAM;  Surgeon: [name], MD;  Location: [facility] CATH LAB;  Service: Cardiovascular;  Laterality: [name];   LEG SURGERY Bilateral    lenghtening    PACEMAKER PLACEMENT  04/13/2016   2nd deg HB after TAVR, pt had DC MDT PPM placed.   SHOULDER ARTHROSCOPY  08/30/2011   Procedure: ARTHROSCOPY SHOULDER;  Surgeon: [name], MD;  Location: [facility];  Service: Orthopedics;  Laterality: Right;  Right Shoulder Arthroscopy, Debridement, and Decompression   SPINAL CORD STIMULATOR INSERTION [name] 09/10/2015   Procedure: LUMBAR SPINAL CORD STIMULATOR INSERTION;  Surgeon: [name], MD;  Location: [facility] NEURO ORS;  Service: Neurosurgery;  Laterality: [name];   TEE WITHOUT CARDIOVERSION [name] 09/16/2019   Procedure: TRANSESOPHAGEAL ECHOCARDIOGRAM (TEE);  Surgeon: Skeet Latch, MD;  Location[facility] ENDOSCOPY;  Service: Cardiovascular;  Laterality: [name];   TEE WITHOUT CARDIOVERSION [name] 12/02/2019   +vegetation on AVR and pacer lead in RV.  EF 55-60%, normal wall motion.  Valves function normal.  Procedure: TRANSESOPHAGEAL ECHOCARDIOGRAM (TEE);   Surgeon: Geralynn Rile, MD;  Location: [facility];  Service: Cardiovascular;  Laterality: [name];   TOE AMPUTATION Left    due to osteomyelitis.  R big toe surg due to osteoarth   TONSILLECTOMY     traeculectomy Left    eye   TRANSCATHETER AORTIC VALVE REPLACEMENT, TRANSFEMORAL  04/11/2016   TRANSESOPHAGEAL ECHOCARDIOGRAM  03/09/2016; 09/2019   [facility]: EF 55-60%, PFO seen with bi-directional shunting, no thrombus in appendage.  09/2019 ->no valvular vegetations. Small patent foramen ovale with predominantly left to right shunting across the interatrial septum.   TRANSTHORACIC ECHOCARDIOGRAM  01/2015; 01/2016; 05/18/16; 09/18/16, 05/2017, 08/2017   01/2015 No signif change in aortic stenosis (moderate).  01/2016 Severe LVH w/small LV cavity, EF 60-65%, grade I diast dysfxn.  05/2016 (s/p TAVR): EF 50-55%, grd I DD, biopros AV good.  08/2016--EF 50-55%, LV septal motion c/w conduction abnl, grd I DD,mild MS,bioprosth aortic valve well seated, w/trace AR. 05/2017 TTE EF 35%. 08/2017-EF 35%, mod diff hypokin LV, grd I DD, biopros AV good.    TRANSTHORACIC ECHOCARDIOGRAM  04/2018; 09/2019   04/2018: EF 40-45%, mod diffuse LV hypokin, grd I DD, bioprosth AV well seated, no AS or AR. 09/2019 EF 60-65%, grd I DD, valves fine, including bioprosth AV. 04/29/20 (tech diff) EF 55-60%, grd I DD, vegetation on MV.  07/2021 EF 55-60% (s/p upgrade to BiV PPM), AV well seated.   VITRECTOMY     Social History   Occupational History   Occupation: Engineer, production    Comment: retired  Tobacco Use   Smoking status: Never   Smokeless tobacco: Never  Vaping Use   Vaping Use: Never used  Substance and Sexual Activity   Alcohol use: No    Alcohol/week: 0.0 standard drinks of alcohol   Drug use: No    Types: Oxycodone   Sexual activity: Not Currently

## 2022-04-01 ENCOUNTER — Other Ambulatory Visit: Payer: Self-pay | Admitting: Family Medicine

## 2022-04-15 ENCOUNTER — Other Ambulatory Visit: Payer: Self-pay | Admitting: Family Medicine

## 2022-04-20 ENCOUNTER — Ambulatory Visit (INDEPENDENT_AMBULATORY_CARE_PROVIDER_SITE_OTHER): Payer: Medicare Other | Admitting: Orthopedic Surgery

## 2022-04-20 DIAGNOSIS — L97521 Non-pressure chronic ulcer of other part of left foot limited to breakdown of skin: Secondary | ICD-10-CM | POA: Diagnosis not present

## 2022-04-21 ENCOUNTER — Encounter: Payer: Self-pay | Admitting: Orthopedic Surgery

## 2022-04-24 ENCOUNTER — Other Ambulatory Visit: Payer: Self-pay | Admitting: Family Medicine

## 2022-04-24 ENCOUNTER — Encounter: Payer: Self-pay | Admitting: Orthopedic Surgery

## 2022-04-24 NOTE — Progress Notes (Signed)
Office Visit Note   Patient: Tim [name]           Date of Birth: Nov 06, 1940           MRN: [id number] Visit Date: 04/20/2022              Requested by: Tammi Sou, MD [address] [place],  [place] [address] PCP: Tammi Sou, MD  Chief Complaint  Patient presents with   Left Foot - Follow-up      HPI: Patient is an 80 year old gentleman who presents in follow-up for ulceration left transmetatarsal amputation.  Patient is currently wearing a compression sock and protective shoe wear.  Assessment & Plan: Visit Diagnoses:  1. Non-pressure chronic ulcer of other part of left foot limited to breakdown of skin ([place])     Plan: Ulcer was debrided back to healthy viable tissue.  Follow-Up Instructions: Return in about 4 weeks (around 05/18/2022).   Ortho Exam  [name], oriented, no adenopathy, well-dressed, normal affect, normal respiratory effort. Examination patient has a chronic wound over the transmetatarsal amputation.  After informed consent a 10 blade knife was used to debride the skin and soft tissue back to healthy viable tissue the wound measures 4 cm in diameter and 2 mm deep after debridement.  There is good healthy granulation tissue this was touched with silver nitrate.  There is no tunneling no exposed bone or tendon.  Imaging: No results found. No images are attached to the encounter.  Labs: Lab Results  Component Value Date   HGBA1C 5.6 02/10/2022   HGBA1C 5.5 02/10/2022   HGBA1C 5.5 02/10/2022   HGBA1C 5.5 (A) 02/10/2022   HGBA1C 5.5 02/10/2022   ESRSEDRATE 30 (H) 12/15/2021   ESRSEDRATE 34 (H) 11/24/2020   ESRSEDRATE 38 (H) 07/14/2020   CRP 1.3 11/24/2020   CRP 0.9 07/14/2020   CRP 0.7 05/03/2020   LABURIC 3.5 (L) 09/10/2013   REPTSTATUS 12/15/2021 FINAL 12/14/2021   CULT  12/14/2021    NO GROWTH Performed at [facility], [place] [address], [place], [place] [address]    LABORGA ENTEROCOCCUS FAECALIS 09/11/2019      Lab Results  Component Value Date   ALBUMIN 2.8 (L) 12/15/2021   ALBUMIN 3.2 (L) 12/14/2021   ALBUMIN 3.4 (L) 10/13/2021    Lab Results  Component Value Date   MG 1.7 05/07/2020   MG 1.6 (L) 05/06/2020   MG 1.8 05/05/2020   No results found for: "VD25OH"  No results found for: "PREALBUMIN"    Latest Ref Rng & Units 02/10/2022    4:15 PM 12/27/2021    9:35 AM 12/20/2021   12:40 PM  CBC EXTENDED  WBC 3.8 - 10.8 Thousand/uL 5.1  4.7  4.9   RBC 4.20 - 5.80 Million/uL 3.66  3.52  3.72   Hemoglobin 13.2 - 17.1 g/dL 11.3  11.0  11.7   HCT 38.5 - 50.0 % 33.7  32.9  33.7   Platelets 140 - 400 Thousand/uL 124  76  121.0   NEUT# 1,500 - 7,800 cells/uL  2,355  2.9   Lymph# 850 - 3,900 cells/uL  1,612  1.3      There is no height or weight on file to calculate BMI.  Orders:  No orders of the defined types were placed in this encounter.  No orders of the defined types were placed in this encounter.    Procedures: No procedures performed  Clinical Data: No additional  findings.  ROS:  All other systems negative, except as noted in the HPI. Review of Systems  Objective: Vital Signs: There were no vitals taken for this visit.  Specialty Comments:  No specialty comments available.  PMFS History: Patient Active Problem List   Diagnosis Date Noted   Venous stasis dermatitis 12/27/2021   Cellulitis 12/14/2021   Wound of right lower extremity 12/14/2021   Transient hypotension 12/14/2021   Heart failure with preserved ejection fraction ([place]) 12/14/2021   Prediabetes 12/14/2021   Thrombocytopenia ([place]) 12/14/2021   Obesity, Class III, BMI 40-49.9 (morbid obesity) ([place]) 12/14/2021   BPH (benign prostatic hyperplasia) 12/14/2021   Vaccine counseling 11/16/2021   Weakness 02/23/2021   Physical deconditioning 02/23/2021   Rhinitis 07/14/2020   Mitral valve vegetation 05/28/2020   Non-pressure chronic ulcer of other part of left foot limited to breakdown of skin  ([place])    Esophageal dysphagia    Bacteremia    Endocarditis of mitral valve    Fever 04/28/2020   Cervical lymphadenopathy 03/15/2020   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   ICD (implantable cardioverter-defibrillator) infection ([employer]) 12/22/2019   Gram-positive bacteremia 11/27/2019   FUO (fever of unknown origin) 11/26/2019   Low blood pressure reading 11/05/2019   Wound dehiscence 10/22/2019   PFO (patent foramen ovale) 10/06/2019   ICD (implantable cardioverter-defibrillator) battery depletion    Pressure injury of skin 09/13/2019   Enterococcal bacteremia history  09/13/2019   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   Urinary retention 09/13/2019   Phimosis 09/13/2019   SIRS (systemic inflammatory response syndrome) ([place]) 09/12/2019   Sepsis ([place]) 09/12/2019   Enterococcus UTI 09/12/2019   Cellulitis of foot    History of transmetatarsal amputation of left foot ([place])    Subacute osteomyelitis, left ankle and foot ([place])    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 ([place]) 04/17/2018   Diabetic neuropathy, painful ([place]) 12/26/2016   Neuropathic pain of foot, left 06/20/2016   Insulin resistance 07/22/2015   Status post amputation of left great toe ([place]) 06/08/2015   Spinal stenosis of lumbar region 02/16/2015   Hereditary and idiopathic peripheral neuropathy 01/12/2015   Oral thrush 12/28/2014   Orthostatic dizziness 05/24/2014   Paresthesias with subjective weakness 01/30/2014   Bilateral thigh pain 01/30/2014   Abdominal wall pain in right upper quadrant 01/22/2014   Gross hematuria 09/25/2013   Intertrigo 09/25/2013   IBS (irritable bowel syndrome) 09/11/2013   Chronic pain syndrome 08/06/2013   CAD (coronary artery disease), native coronary artery 12/25/2012   Paroxysmal atrial fibrillation ([place]) 12/25/2012   Moderate  aortic stenosis 12/25/2012   Aortic stenosis 12/24/2012   Personal history of colonic adenoma 10/30/2012   Diverticulosis of colon (without mention of hemorrhage) 10/30/2012   Internal hemorrhoids 10/30/2012   Change in bowel habits intermittent loose stools 10/30/2012   Routine health maintenance 06/09/2012   Hereditary peripheral neuropathy 10/10/2011   Long term current use of anticoagulant - apixiban 08/16/2010   Dyspnea on exertion 09/08/2009   HYPERCHOLESTEROLEMIA-PURE 01/13/2009   Essential hypertension, benign 01/13/2009   EDEMA 01/13/2009   GOUT 01/12/2009   GLAUCOMA 01/12/2009   Venous insufficiency (chronic) (peripheral) 01/12/2009   COPD (chronic obstructive pulmonary disease) ([place]) 01/12/2009   VENTRAL HERNIA 01/12/2009   OSTEOARTHRITIS 01/12/2009   ARTHRITIS 01/12/2009   DEGENERATIVE DISC DISEASE 01/12/2009   CATARACT, HX OF 01/12/2009   HEART MURMUR, HX OF 01/12/2009   BENIGN  PROSTATIC HYPERTROPHY, HX OF 01/12/2009   Obesity 01/17/2007   OBSTRUCTIVE SLEEP APNEA 01/17/2007   ASTHMA 01/17/2007   GERD 01/17/2007   HALLUX VALGUS, ACQUIRED 01/17/2007   Pulmonary hypertension ([place]) 01/17/2007   Past Medical History:  Diagnosis Date   AICD (automatic cardioverter/defibrillator) present    Balanitis    +severe phimosis+ buried penis->circ not possible but dorsal slit done 10/2019   BPH (benign prostatic hypertrophy)    with urinary retention.  Renal u/s 12/04/19 NORMAL KIDNEYS, NORMAL BLADDER, NO HYDRONEPHROSIS   CAD (coronary artery disease)    Nonobstructive by 12/2012 cath; then 03/2016 he required BMS to RCA ([facility]).  In-stent restenosis on cath 11/01/16, baloon angioplasty successful.   CATARACT, HX OF    Chronic combined systolic and diastolic CHF (congestive heart failure) ([place]) 05/2016   Ischemic CM.  04/2018 EF 40-45%, grd I DD.  07/2021 EF 55-60%, AV well seated   Chronic pain syndrome    Lumbar DDD; chronic neuropathic pain (DM); has spinal stimulator and sees  pain mgmt MD   Complicated UTI (urinary tract infection) 09/2019   Phimoses, acute urinary retention, entoerococcus UTI, enterococc bacteremia.  F/u blood clx's neg x 5d.     COPD    Debilitated patient    Diabetes mellitus type 2 with complications ([place])    LKG4W jumped from 5.7% to 6.1% 03/2015---started metformin at that time.  DM 2 dx by fasting gluc criteria 2018.  Has chronic neuropath pain   Enterococcal bacteremia 09/2019   Phimoses, acute urinary retention, entoerococcus UTI, enterococc bacteremia.  F/u blood clx's neg x 5d.     Essential hypertension, benign    Fatty liver 2007   2007 u/s showed fatty liver with hepatosplenomegaly.  2019 repeat u/s->fatty liver but no cirrhosis or hepatosplenomegaly.   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 Tommy Medal)   Lumbosacral neuritis    Lumbosacral spondylosis    Lumbar spinal stenosis with neurogenic claudication--contributes to his chronic pain syndrome   Morbid obesity ([place])    Normal memory function 08/2014   Neuropsychological testing ([facility]): no cognitive impairment or sign of neurodegenerative disorder.  Likely has adjustment d/o with mixed anxiety/depressed features and may benefit from low dose SNRI.     Normocytic anemia 03/2016   Mild-pt needs ferritin and vit B12 level checked (as of 03/22/16). Hb stable 09/2019.  Hemoccult x 3 NEG 03/2022   NSTEMI (non-ST elevated myocardial infarction) ([place]) 03/20/2016   BMS to RCA   Obesity hypoventilation syndrome (HCC)    Orthostatic hypotension    OSA on CPAP    8 cm H2O   OSTEOARTHRITIS    Paroxysmal  atrial fibrillation ([place]) 2003    (? chronic?) Off anticoag for a while due to falls.  Then apixaban started 12/2014.   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 Ella Bodo): neuropathic pain->diff to treat, failed nucynta, failed spinal stimul trial, oxycontin hs + tramadol + gabap as of 12/2017 f/u Dr. Letta Pate.   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   Sick sinus syndrome (HCC)    PPM placed   Thrombocytopenia ([place]) 2018   HSM on 2007 abd u/s---suspect some mild splenic sequestration chronically.   Unspecified glaucoma(365.9)    Unspecified hereditary and idiopathic peripheral neuropathy approx age 40   bilat LE's, ? left arm, too.  Feet became progressively numb + left foot pain intermittently.  Pt may be trying a spinal stimulator (as of 05/2015)   Vaccine counseling 11/16/2021   VENOUS INSUFFICIENCY    Being followed by Dr. Sharol Given as of 10/2016 for two R LL venous stasis ulcers/skin tears.  Healed as of 10/30/16 f/u with Dr. Sharol Given.   Venous stasis dermatitis 12/27/2021   VENTRAL HERNIA     Family History  Problem Relation Age of Onset   Hypertension Mother    Coronary artery disease Mother    Heart attack Mother    Neuropathy Mother    Pulmonary fibrosis Father         asbestosis    Past Surgical History:  Procedure Laterality Date   AMPUTATION Left 04/11/2013   Procedure: AMPUTATION DIGIT Left 3rd toe;  Surgeon: [name], MD;  Location: [facility];  Service: Orthopedics;  Laterality: Left;  Left 3rd toe amputation at [facility]   AMPUTATION Left 08/29/2019   Procedure: LEFT TRANSMETATARSAL AMPUTATION;  Surgeon: [name], MD;  Location: [facility];  Service: Orthopedics;  Laterality: Left;   BIOPSY  05/04/2020   Procedure: BIOPSY;  Surgeon: Jackquline Denmark, MD;  Location: [name] ENDOSCOPY;  Service: Endoscopy;;   [place]; 03/10/16   1997 Non-obstructive disease.  03/2016 BMS to RCA, with 25% pDiag dz, o/w normal cors per cath 03/07/16.  Cath 11/01/16: in stent restenosis, successful baloon angioplasty. 08/2020 branch vessel dz, cont medical therapy rec'd.   CARDIAC CATHETERIZATION  12/24/2012   mild < 20% LCx, prox 30% RCA; LVEF 55-65% , moderate pulmonary HTN, moderate AS   CARDIAC DEFIBRILLATOR PLACEMENT  11/07/2017   [facility] MRI Quad CRT defibrillator   CARDIOVASCULAR STRESS TEST  05/11/16 ([facility])   2017 Myocardial perfusion imaging:  No ischemia; scar in apex, global hypokinesis, EF 36%.  06/15/20->mod primarily fixed inferolat wall defect mildly worse with stress c/w infarct/scar with mild peri-infarct ischemia, normal EF-->for cath per cards.   Carotid dopplers  03/09/2016   [facility]: no hemodynamically significant stenosis on either side.   CHOLECYSTECTOMY     COLONOSCOPY [name] 10/30/2012   Procedure: COLONOSCOPY;  Surgeon: [name], MD;  Location: [facility] ENDOSCOPY;  Service: Endoscopy;  Laterality: [name];   CORONARY ANGIOPLASTY WITH STENT PLACEMENT  03/2016; 04/2017   2017-[facility]: BMS to RCA-pt was placed on Brilinta.  04/2017: DES to RCA.   DORSAL SLIT [name] 10/29/2019   for severe phimosis. Procedure: DORSAL SLIT;  Surgeon: Alexis Frock, MD;  Location: [facility] ORS;  Service: Urology;  Laterality: [name];  [place]  05/04/2020    Procedure: ESOPHAGEAL DILATION;  Surgeon: Jackquline Denmark, MD;  Location[name] ENDOSCOPY;  Service: Endoscopy;;   ESOPHAGOGASTRODUODENOSCOPY (EGD) WITH PROPOFOL [name] 05/04/2020   Procedure: ESOPHAGOGASTRODUODENOSCOPY (EGD) WITH PROPOFOL;  Surgeon: Jackquline Denmark, MD;  Location: [facility] ENDOSCOPY;  Service: Endoscopy;  Laterality: [name];   EYE SURGERY Bilateral cataract   HEMORRHOID SURGERY     INTRAOCULAR LENS INSERTION Bilateral    KNEE SURGERY Right    LEFT AND RIGHT HEART CATHETERIZATION WITH CORONARY ANGIOGRAM [name] 12/24/2012   Procedure: LEFT AND RIGHT HEART CATHETERIZATION WITH CORONARY ANGIOGRAM;  Surgeon: [name], MD;  Location: [facility] CATH LAB;  Service: Cardiovascular;  Laterality: [name];   LEG SURGERY Bilateral    lenghtening    PACEMAKER PLACEMENT  04/13/2016   2nd deg HB after TAVR, pt had DC MDT PPM placed.   SHOULDER ARTHROSCOPY  08/30/2011   Procedure: ARTHROSCOPY SHOULDER;  Surgeon: [name], MD;  Location: [facility];  Service: Orthopedics;  Laterality: Right;  Right Shoulder Arthroscopy, Debridement, and Decompression   SPINAL CORD STIMULATOR INSERTION [name] 09/10/2015   Procedure: LUMBAR SPINAL CORD STIMULATOR INSERTION;  Surgeon: [name], MD;  Location: [facility] NEURO ORS;  Service: Neurosurgery;  Laterality: [name];   TEE WITHOUT CARDIOVERSION [name] 09/16/2019   Procedure: TRANSESOPHAGEAL ECHOCARDIOGRAM (TEE);  Surgeon: Skeet Latch, MD;  Location: [facility] ENDOSCOPY;  Service: Cardiovascular;  Laterality: [name];   TEE WITHOUT CARDIOVERSION [name] 12/02/2019   +vegetation on AVR and pacer lead in RV.  EF 55-60%, normal wall motion.  Valves function normal.  Procedure: TRANSESOPHAGEAL ECHOCARDIOGRAM (TEE);  Surgeon: Geralynn Rile, MD;  Location: [facility];  Service: Cardiovascular;  Laterality: [name];   TOE AMPUTATION Left    due to osteomyelitis.  R big toe surg due to osteoarth   TONSILLECTOMY     traeculectomy Left    eye   TRANSCATHETER AORTIC VALVE REPLACEMENT, TRANSFEMORAL   04/11/2016   TRANSESOPHAGEAL ECHOCARDIOGRAM  03/09/2016; 09/2019   [facility]: EF 55-60%, PFO seen with bi-directional shunting, no thrombus in appendage.  09/2019 ->no valvular vegetations. Small patent foramen ovale with predominantly left to right shunting across the interatrial septum.   TRANSTHORACIC ECHOCARDIOGRAM  01/2015; 01/2016; 05/18/16; 09/18/16, 05/2017, 08/2017   01/2015 No signif change in aortic stenosis (moderate).  01/2016 Severe LVH w/small LV cavity, EF 60-65%, grade I diast dysfxn.  05/2016 (s/p TAVR): EF 50-55%, grd I DD, biopros AV good.  08/2016--EF 50-55%, LV septal motion c/w conduction abnl, grd I DD,mild MS,bioprosth aortic valve well seated, w/trace AR. 05/2017 TTE EF 35%. 08/2017-EF 35%, mod diff hypokin LV, grd I DD, biopros AV good.    TRANSTHORACIC ECHOCARDIOGRAM  04/2018; 09/2019   04/2018: EF 40-45%, mod diffuse LV hypokin, grd I DD, bioprosth AV well seated, no AS or AR. 09/2019 EF 60-65%, grd I DD, valves fine, including bioprosth AV. 04/29/20 (tech diff) EF 55-60%, grd I DD, vegetation on MV.  07/2021 EF 55-60% (s/p upgrade to BiV PPM), AV well seated.   VITRECTOMY     Social History   Occupational History   Occupation: Engineer, production    Comment: retired  Tobacco Use   Smoking status: Never   Smokeless tobacco: Never  Scientific laboratory technician Use: Never used  Substance and Sexual Activity   Alcohol use: No    Alcohol/week: 0.0 standard drinks of alcohol   Drug use: No    Types: Oxycodone   Sexual activity: Not Currently

## 2022-05-15 ENCOUNTER — Encounter: Payer: Self-pay | Admitting: Family Medicine

## 2022-05-15 ENCOUNTER — Ambulatory Visit: Payer: Medicare Other | Admitting: Family Medicine

## 2022-05-15 VITALS — BP 130/83 | HR 80 | Temp 97.3°F | Ht 76.0 in | Wt 304.4 lb

## 2022-05-15 DIAGNOSIS — M792 Neuralgia and neuritis, unspecified: Secondary | ICD-10-CM | POA: Diagnosis not present

## 2022-05-15 DIAGNOSIS — E1142 Type 2 diabetes mellitus with diabetic polyneuropathy: Secondary | ICD-10-CM

## 2022-05-15 DIAGNOSIS — Z23 Encounter for immunization: Secondary | ICD-10-CM | POA: Diagnosis not present

## 2022-05-15 LAB — POCT GLYCOSYLATED HEMOGLOBIN (HGB A1C)
HbA1c POC (<> result, manual entry): 5.9 % (ref 4.0–5.6)
HbA1c, POC (controlled diabetic range): 5.9 % (ref 0.0–7.0)
HbA1c, POC (prediabetic range): 5.9 % (ref 5.7–6.4)
Hemoglobin A1C: 5.9 % — AB (ref 4.0–5.6)

## 2022-05-15 MED ORDER — PANTOPRAZOLE SODIUM 40 MG PO TBEC: 40.0000 mg | DELAYED_RELEASE_TABLET | Freq: Every day | ORAL | 1 refills | Status: DC

## 2022-05-15 MED ORDER — POTASSIUM CHLORIDE CRYS ER 20 MEQ PO TBCR
20.0000 meq | EXTENDED_RELEASE_TABLET | Freq: Every day | ORAL | 1 refills | Status: DC
Start: 2022-05-15 — End: 2022-12-05

## 2022-05-15 MED ORDER — ALLOPURINOL 300 MG PO TABS: 300.0000 mg | ORAL_TABLET | Freq: Every day | ORAL | 1 refills | Status: DC

## 2022-05-15 NOTE — Progress Notes (Signed)
OFFICE VISIT  05/15/2022  CC: f/u dm, pain, edema  Patient is a 81 y.o. male who presents for 70-monthfollow-up diabetes and neuropathic pain right foot. A/P as of last visit: "#1 bilateral lower extremity edema, chronic and stable.  This is secondary to his chronic heart failure as well as venous insufficiency. He has chronic venous stasis dermatitis and suspicion recently of superimposed cellulitis on right lower leg. This is improving. He had thrombocytopenia when he was recently on Zyvox.  He discontinued this medication as instructed by infectious disease MD.  Recheck platelets today.   2.  Neuropathic pain in left foot. Continue gabapentin 800 mg 4 times daily.  Add oxycodone 20 mg, 1 twice daily as needed.  #60 prescribed today.   #3 type 2 diabetes with neuropathy. Well-controlled without glycemic agents. Hemoglobin A1c 5.5% today.   #4 coronary artery disease, chronic combined systolic and diastolic heart failure. Has had some medication dosing limitations due to hypotension/dizziness. Continue Entresto 24-26 one half tab twice a day and so far he is tolerating the Toprol XL one half of the 25 mg tab daily. Lipid panel and complete metabolic panel today.  Forward results to his cardiologist, Dr. SClovis Riley"  INTERIM HX: Platelets improved to 124,000 last time he was here.  All other labs were normal.  He is feeling good, moving around better lately. Back pain is less of a problem of late, possibly d/t 30 lb wt loss that has come d/t abnl taste from that he attributes to the amoxil. Has only had to use oxycodone a handful of times for neuropathy: it has worked well.   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  Cardiology follow-up 02/27/2022 for coronary  artery disease with history of multiple PCI's, history of ischemic cardiomyopathy which responded well to biventricular ICD, paroxysmal A-fib, history of NSVT, history of TAVR, and history of endocarditis of prosthetic valve and pacer (on chronic amoxil suppressive therapy).  He was having no further hypotension problems.  Was continued on beta-blocker and Entresto as well as Eliquis, aspirin, rosuvastatin, and Zetia.  Most recent monthly orthopedic follow-up with Dr. DSharol Given10/19/2023 for ulceration of left foot transmetatarsal amputation.  The ulceration was limited to the skin and debridement was performed that day.   Past Medical History:  Diagnosis Date   AICD (automatic cardioverter/defibrillator) present    Balanitis    +severe phimosis+ buried penis->circ not possible but dorsal slit done 10/2019   BPH (benign prostatic hypertrophy)    with urinary retention.  Renal u/s 12/04/19 NORMAL KIDNEYS, NORMAL BLADDER, NO HYDRONEPHROSIS   CAD (coronary artery disease)    Nonobstructive by 12/2012 cath; then 03/2016 he required BMS to RCA ([facility]).  In-stent restenosis on cath 11/01/16, baloon angioplasty successful.   CATARACT, HX OF    Chronic combined systolic and diastolic CHF (congestive heart failure) ([place] 05/2016   Ischemic CM.  04/2018 EF 40-45%, grd I DD.  07/2021 EF 55-60%, AV well seated   Chronic pain syndrome    Lumbar DDD; chronic neuropathic pain (DM); has spinal stimulator and sees pain mgmt MD   Complicated UTI (urinary tract infection) 09/2019   Phimoses, acute urinary retention, entoerococcus UTI, enterococc bacteremia.  F/u blood clx's neg x 5d.     COPD    Debilitated patient    Diabetes  mellitus type 2 with complications ([place])    UDJ4H jumped from 5.7% to 6.1% 03/2015---started metformin at that time.  DM 2 dx by fasting gluc criteria 2018.  Has chronic neuropath pain   Enterococcal bacteremia 09/2019   Phimoses, acute urinary retention, entoerococcus UTI, enterococc bacteremia.   F/u blood clx's neg x 5d.     Essential hypertension, benign    Fatty liver 2007   2007 u/s showed fatty liver with hepatosplenomegaly.  2019 repeat u/s->fatty liver but no cirrhosis or hepatosplenomegaly.   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 Tommy Medal)   Lumbosacral neuritis    Lumbosacral spondylosis    Lumbar spinal stenosis with neurogenic claudication--contributes to his chronic pain syndrome   Morbid obesity ([place])    Normal memory function 08/2014   Neuropsychological testing ([facility]): no cognitive impairment or sign of neurodegenerative disorder.  Likely has adjustment d/o with mixed anxiety/depressed features and may benefit from low dose SNRI.     Normocytic anemia 03/2016   Mild-pt needs ferritin and vit B12 level checked (as of 03/22/16). Hb stable 09/2019.  Hemoccult x 3 NEG 03/2022   NSTEMI (non-ST elevated myocardial infarction) ([place]) 03/20/2016   BMS to RCA   Obesity hypoventilation syndrome (HCC)    Orthostatic hypotension    OSA on CPAP    8 cm H2O   OSTEOARTHRITIS    Paroxysmal atrial fibrillation ([place]) 2003    (? chronic?) Off anticoag for a while due to falls.  Then apixaban started 12/2014.   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 Ella Bodo): neuropathic pain->diff to treat, failed nucynta, failed spinal stimul trial, oxycontin hs + tramadol + gabap as of 12/2017 f/u Dr. Letta Pate.   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   Sick sinus syndrome (HCC)    PPM placed   Thrombocytopenia ([place]) 2018   HSM on 2007 abd u/s---suspect some mild splenic sequestration chronically.   Unspecified glaucoma(365.9)    Unspecified hereditary and idiopathic peripheral neuropathy approx age 58   bilat LE's, ? left arm, too.  Feet became progressively numb + left foot pain intermittently.  Pt may be trying a spinal stimulator (as of 05/2015)   Vaccine counseling 11/16/2021   VENOUS INSUFFICIENCY    Being followed by Dr. Sharol Given as of 10/2016 for two R LL venous stasis ulcers/skin tears.  Healed as of 10/30/16 f/u  with Dr. Sharol Given.   Venous stasis dermatitis 12/27/2021   VENTRAL HERNIA     Past Surgical History:  Procedure Laterality Date   AMPUTATION Left 04/11/2013   Procedure: AMPUTATION DIGIT Left 3rd toe;  Surgeon: [name], MD;  Location: [employer];  Service: Orthopedics;  Laterality: Left;  Left 3rd toe amputation at [facility]   AMPUTATION Left 08/29/2019   Procedure: LEFT TRANSMETATARSAL AMPUTATION;  Surgeon: [name], MD;  Location: [place];  Service: Orthopedics;  Laterality: Left;   BIOPSY  05/04/2020   Procedure: BIOPSY;  Surgeon: Jackquline Denmark, MD;  Location: [facility] ENDOSCOPY;  Service: Endoscopy;;   [place]; 03/10/16   1997 Non-obstructive disease.  03/2016 BMS to RCA, with 25% pDiag dz, o/w normal cors per cath 03/07/16.  Cath 11/01/16: in stent restenosis,  successful baloon angioplasty. 08/2020 branch vessel dz, cont medical therapy rec'd.   CARDIAC CATHETERIZATION  12/24/2012   mild < 20% LCx, prox 30% RCA; LVEF 55-65% , moderate pulmonary HTN, moderate AS   CARDIAC DEFIBRILLATOR PLACEMENT  11/07/2017   [facility] MRI Quad CRT defibrillator   CARDIOVASCULAR STRESS TEST  05/11/16 ([facility])   2017 Myocardial perfusion imaging:  No ischemia; scar in apex, global hypokinesis, EF 36%.  06/15/20->mod primarily fixed inferolat wall defect mildly worse with stress c/w infarct/scar with mild peri-infarct ischemia, normal EF-->for cath per cards.   Carotid dopplers  03/09/2016   [facility]: no hemodynamically significant stenosis on either side.   CHOLECYSTECTOMY     COLONOSCOPY [name] 10/30/2012   Procedure: COLONOSCOPY;  Surgeon: [name], MD;  Location: [facility];  Service: Endoscopy;  Laterality: [name];   CORONARY ANGIOPLASTY WITH STENT PLACEMENT  03/2016; 04/2017   2017-[facility]: BMS to RCA-pt was placed on Brilinta.  04/2017: DES to RCA.   DORSAL SLIT [name] 10/29/2019   for severe phimosis. Procedure: DORSAL SLIT;  Surgeon: Alexis Frock, MD;  Location: [facility];  Service: Urology;  Laterality: [name];  [place]  05/04/2020   Procedure: ESOPHAGEAL DILATION;  Surgeon: Jackquline Denmark, MD;  Location: [facility] ENDOSCOPY;  Service: Endoscopy;;   ESOPHAGOGASTRODUODENOSCOPY (EGD) WITH PROPOFOL [name] 05/04/2020   Procedure: ESOPHAGOGASTRODUODENOSCOPY (EGD) WITH PROPOFOL;  Surgeon: Jackquline Denmark, MD;  Location: [facility] ENDOSCOPY;  Service: Endoscopy;  Laterality: [name];   EYE SURGERY Bilateral cataract   HEMORRHOID SURGERY     INTRAOCULAR LENS INSERTION Bilateral    KNEE SURGERY Right    LEFT AND RIGHT HEART CATHETERIZATION WITH CORONARY ANGIOGRAM [name] 12/24/2012   Procedure: LEFT AND RIGHT HEART CATHETERIZATION WITH CORONARY ANGIOGRAM;  Surgeon: [name], MD;  Location: [facility] CATH LAB;  Service: Cardiovascular;  Laterality: [name];   LEG SURGERY Bilateral     lenghtening    PACEMAKER PLACEMENT  04/13/2016   2nd deg HB after TAVR, pt had DC MDT PPM placed.   SHOULDER ARTHROSCOPY  08/30/2011   Procedure: ARTHROSCOPY SHOULDER;  Surgeon: [name], MD;  Location: [place];  Service: Orthopedics;  Laterality: Right;  Right Shoulder Arthroscopy, Debridement, and Decompression   SPINAL CORD STIMULATOR INSERTION [name] 09/10/2015   Procedure: LUMBAR SPINAL CORD STIMULATOR INSERTION;  Surgeon: [name], MD;  Location: [place] NEURO ORS;  Service: Neurosurgery;  Laterality: [name];   TEE WITHOUT CARDIOVERSION [name] 09/16/2019   Procedure: TRANSESOPHAGEAL ECHOCARDIOGRAM (TEE);  Surgeon: Skeet Latch, MD;  Location: [facility] ENDOSCOPY;  Service: Cardiovascular;  Laterality: [name];   TEE WITHOUT CARDIOVERSION [name] 12/02/2019   +vegetation on AVR and pacer lead in RV.  EF  55-60%, normal wall motion.  Valves function normal.  Procedure: TRANSESOPHAGEAL ECHOCARDIOGRAM (TEE);  Surgeon: Geralynn Rile, MD;  Location: [place];  Service: Cardiovascular;  Laterality: [name];   TOE AMPUTATION Left    due to osteomyelitis.  R big toe surg due to osteoarth   TONSILLECTOMY     traeculectomy Left    eye   TRANSCATHETER AORTIC VALVE REPLACEMENT, TRANSFEMORAL  04/11/2016   TRANSESOPHAGEAL ECHOCARDIOGRAM  03/09/2016; 09/2019   [facility]: EF 55-60%, PFO seen with bi-directional shunting, no thrombus in appendage.  09/2019 ->no valvular vegetations. Small patent foramen ovale with predominantly left to right shunting across the interatrial septum.   TRANSTHORACIC ECHOCARDIOGRAM  01/2015; 01/2016; 05/18/16; 09/18/16, 05/2017, 08/2017   01/2015 No signif change in aortic stenosis (moderate).  01/2016 Severe LVH w/small LV cavity, EF 60-65%, grade I diast dysfxn.  05/2016 (s/p TAVR): EF 50-55%, grd I DD, biopros AV good.  08/2016--EF 50-55%, LV septal motion c/w conduction abnl, grd I DD,mild MS,bioprosth aortic valve well seated, w/trace AR. 05/2017 TTE EF 35%. 08/2017-EF 35%, mod diff hypokin LV,  grd I DD, biopros AV good.    TRANSTHORACIC ECHOCARDIOGRAM  04/2018; 09/2019   04/2018: EF 40-45%, mod diffuse LV hypokin, grd I DD, bioprosth AV well seated, no AS or AR. 09/2019 EF 60-65%, grd I DD, valves fine, including bioprosth AV. 04/29/20 (tech diff) EF 55-60%, grd I DD, vegetation on MV.  07/2021 EF 55-60% (s/p upgrade to BiV PPM), AV well seated.   VITRECTOMY      Outpatient Medications Prior to Visit  Medication Sig Dispense Refill   albuterol (PROVENTIL HFA;VENTOLIN HFA) 108 (90 BASE) MCG/ACT inhaler Inhale 2 puffs into the lungs 4 (four) times daily as needed for wheezing or shortness of breath.     allopurinol (ZYLOPRIM) 300 MG tablet TAKE 1 TABLET BY MOUTH EVERY DAY WITH FOOD 90 tablet 0   amoxicillin (AMOXIL) 500 MG tablet Take 2 tablets (1,000 mg total) by mouth 2 (two) times daily. Resume taking 6/21 PM after linezolid course completed 120 tablet 11   apixaban (ELIQUIS) 5 MG TABS tablet Take 5 mg by mouth 2 (two) times daily.     ASPIRIN LOW DOSE 81 MG EC tablet Take 81 mg by mouth daily.     B Complex-C (B-COMPLEX WITH VITAMIN C) tablet Take 1 tablet by mouth daily. Take 1 tablet daily, 257m     ezetimibe (ZETIA) 10 MG tablet Take 1 tablet (10 mg total) by mouth daily. 30 tablet 1   finasteride (PROSCAR) 5 MG tablet TAKE 1 TABLET BY MOUTH EVERY DAY 90 tablet 0   gabapentin (NEURONTIN) 800 MG tablet TAKE 1 TABLET BY MOUTH 4 TIMES DAILY. 360 tablet 1   KLOR-CON M20 20 MEQ tablet TAKE 1 TABLET BY MOUTH EVERY DAY 90 tablet 0   metoprolol succinate (TOPROL-XL) 25 MG 24 hr tablet Take 12.5 mg by mouth daily.     Multiple Vitamin (MULTIVITAMIN ADULT PO) Take 1 tablet by mouth daily.     niacin (NIASPAN) 1000 MG CR tablet TAKE 1 TABLET BY MOUTH TWICE A DAY 180 tablet 0   Oxycodone HCl 20 MG TABS 1 tab po bid prn severe pain 60 tablet 0   pantoprazole (PROTONIX) 40 MG tablet TAKE 1 TABLET BY MOUTH EVERY DAY 90 tablet 0   Probiotic Product (ALIGN PO) Take by mouth daily.      rosuvastatin (CRESTOR) 40 MG tablet take 1 tablet by mouth once daily (Patient taking differently: Take 40 mg  by mouth daily.) 15 tablet 0   sacubitril-valsartan (ENTRESTO) 24-26 MG Take 1 tablet by mouth 2 (two) times daily.      vitamin C (ASCORBIC ACID) 250 MG tablet Take 250 mg by mouth daily.     VITAMIN D, CHOLECALCIFEROL, PO Take 1 tablet by mouth daily.      WIXELA INHUB 250-50 MCG/ACT AEPB TAKE 1 PUFF BY MOUTH TWICE A DAY (Patient taking differently: Inhale 1 puff into the lungs in the morning and at bedtime.) 60 each 5   nitroGLYCERIN (NITROSTAT) 0.4 MG SL tablet Place 0.4 mg under the tongue every 5 (five) minutes as needed for chest pain. (Patient not taking: Reported on 05/15/2022)     No facility-administered medications prior to visit.    Allergies  Allergen Reactions   Brimonidine Tartrate Shortness Of Breath    Alphagan-Shortness of breath   Brinzolamide Shortness Of Breath    AZOPT- Shortness of breath   Latanoprost Shortness Of Breath    XALATAN- Shortness of breath   Nucynta [Tapentadol] Shortness Of Breath   Sulfa Antibiotics Palpitations   Timolol Maleate Shortness Of Breath and Other (See Comments)    TIMOPTIC- Aggravated asthma   Diltiazem Swelling     leg swelling   Rofecoxib Swelling     VIOXX- leg swelling   Vancomycin Rash and Other (See Comments)    Blisters   Codeine Other (See Comments)    Childhood reaction   Tamsulosin Other (See Comments)    Dizziness    Celecoxib Other (See Comments)    CELLBREX-confusion   Colchicine Diarrhea    diarrhea   Tape Rash    ROS As per HPI  PE:    05/15/2022    3:34 PM 02/13/2022    2:21 PM 02/10/2022    3:30 PM  Vitals with BMI  Height 6' 4"   6' 4"   Weight 304 lbs 6 oz    BMI 44.01    Systolic 027 253 664  Diastolic 83 68 75  Pulse 80 80 78   Physical Exam  Gen: Alert, well appearing.  Patient is oriented to person, place, time, and situation. CV: RRR, no m/r/g.   LUNGS: CTA bilat, nonlabored  resps, good aeration in all lung fields.   LABS:  Last CBC Lab Results  Component Value Date   WBC 5.1 02/10/2022   HGB 11.3 (L) 02/10/2022   HCT 33.7 (L) 02/10/2022   MCV 92.1 02/10/2022   MCH 30.9 02/10/2022   RDW 14.3 02/10/2022   PLT 124 (L) 02/10/2022   Lab Results  Component Value Date   IRON 173 12/20/2021   TIBC 262 12/20/2021   FERRITIN 60 40/34/7425   Last metabolic panel Lab Results  Component Value Date   GLUCOSE 130 (H) 02/10/2022   NA 141 02/10/2022   K 4.1 02/10/2022   CL 107 02/10/2022   CO2 27 02/10/2022   BUN 12 02/10/2022   CREATININE 0.67 (L) 02/10/2022   GFRNONAA >60 12/15/2021   CALCIUM 9.3 02/10/2022   PROT 6.1 02/10/2022   ALBUMIN 2.8 (L) 12/15/2021   BILITOT 0.6 02/10/2022   ALKPHOS 49 12/15/2021   AST 24 02/10/2022   ALT 17 02/10/2022   ANIONGAP 10 12/15/2021   Last lipids Lab Results  Component Value Date   CHOL 115 02/10/2022   HDL 58 02/10/2022   LDLCALC 43 02/10/2022   TRIG 65 02/10/2022   CHOLHDL 2.0 02/10/2022   Last hemoglobin A1c Lab Results  Component Value Date  HGBA1C 5.6 02/10/2022   Last thyroid functions Lab Results  Component Value Date   TSH 0.905 04/29/2020   T3TOTAL 89 09/16/2018   Last vitamin B12 and Folate Lab Results  Component Value Date   [id number] 927 05/24/2016   IMPRESSION AND PLAN:  #1 diabetes with diabetic peripheral neuropathy. Great control with diet only. POC Hba1c today is 5.9%.  #2 Neuropathic pain in left foot. Continue gabapentin 800 mg 4 times daily.  Recent addition of oxycodone 20 mg on an as-needed basis has been helpful and he is only used it a few times.  He notes that it is helpful psychologically for him to know he has something to take in case the pain is very severe.  This makes him more willing to put up with mild to moderate pain. No new prescription needed today.  Electrolytes and kidney function have been stable and consistently normal over at least the last  year.  Will wait to recheck this at next follow-up in 3 months.  An After Visit Summary was printed and given to the patient.  FOLLOW UP: No follow-ups on file.  Signed:  Crissie Sickles, MD           05/15/2022

## 2022-05-18 ENCOUNTER — Ambulatory Visit: Payer: Medicare Other | Admitting: Orthopedic Surgery

## 2022-05-19 ENCOUNTER — Other Ambulatory Visit: Payer: Self-pay | Admitting: Family Medicine

## 2022-05-22 ENCOUNTER — Other Ambulatory Visit: Payer: Self-pay

## 2022-05-22 ENCOUNTER — Encounter: Payer: Self-pay | Admitting: Infectious Disease

## 2022-05-22 ENCOUNTER — Ambulatory Visit (INDEPENDENT_AMBULATORY_CARE_PROVIDER_SITE_OTHER): Payer: Medicare Other | Admitting: Infectious Disease

## 2022-05-22 VITALS — BP 119/76 | HR 73 | Resp 16 | Ht 76.0 in | Wt 304.4 lb

## 2022-05-22 DIAGNOSIS — I38 Endocarditis, valve unspecified: Secondary | ICD-10-CM

## 2022-05-22 DIAGNOSIS — T826XXD Infection and inflammatory reaction due to cardiac valve prosthesis, subsequent encounter: Secondary | ICD-10-CM

## 2022-05-22 DIAGNOSIS — M86272 Subacute osteomyelitis, left ankle and foot: Secondary | ICD-10-CM | POA: Diagnosis not present

## 2022-05-22 DIAGNOSIS — T827XXD Infection and inflammatory reaction due to other cardiac and vascular devices, implants and grafts, subsequent encounter: Secondary | ICD-10-CM

## 2022-05-22 NOTE — Progress Notes (Signed)
Subjective:  Chief complaint: Follow-up for pacemaker infection with Enterococcus, also osteomyelitis of foot     Patient ID: Tim [name], male    DOB: 1941-01-31, 81 y.o.   MRN: [id number]  HPI  81 y.o. male who  initially presented with AMP sensitive enterococcal UTI complicated by enterococcal bacteremia in March 2021 in the setting of AICD and TAVR. TTE and TEE at the time were without evidence of vegetation and was prescribed 2 weeks of ampicillin. On follow up on 4/21 there was worsening of his amputation site with erythema and drainage. He was placed on doxycycline by Dr. Sharol Given which was then changed to Zyvox by me. This resolved his symptoms. On follow up with myself  on 11/26/2019 he was experiencing fevers and dizziness with concern for systemic infection. Blood cultures in the ID clinic were positive for Enterococcus faecalis bacteremia. Surgical site was well healed at that time. 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 Recommended treatment was 6 weeks of ampicillin and ceftriaxone through 6/21. Feeling better on 6/21 and transitioned to life-long suppression with amoxicillin three times daily.    [name] was then seen on 7/15 /2021 seen by Dr. Johnnye Sima with concern for cellulitis and was continued on his Amoxicillin. He followed up with Dr. Johnnye Sima on 8/6 and continued to do well with Amoxicillin.   Blood cultures no growth but MV had vegetation. TEE was not successful.  He was retreated with ampicillin high-dose and ceftriaxone twice daily.  He completed 6 weeks of therapy and  was then on  high-dose amoxicillin to 1000 g twice daily.  Followed very closely by Dr. Sharol Given for his chronic surgical wound site where he had osteomyelitis in that area is been doing well according to him and also going to Dr. Jess Barters notes.  He was then admitted to [facility] with infection of his foot having presented with redness and drainage from the left TMA site. Blood  cultures from admission were negative. CT of the left foot had cellulitis of the distal forefoot with abscess and acute osteomyelitis of the second third and fourth metatarsal stumps. There was also possible acute osteomyelitis of the first metatarsal stump.  On 1/12 he underwent I&D with revision of transmetatarsal amputation and bone biopsy.  Operative culture grew 1 colony of staph lugdinensis. There was concern for residual osteomyelitis  He was discharged to SNF on 1/22 on cefepime/flagyl with plans to continue through 2/22. He presented back to the ED 1/23 reporting inadequate care at the SNF. He was afebrile with WBC 7,300 and procalcitonin 0.16. He reported some queasiness and metallic taste in his mouth concerning for flagyl adverse events. He was changed to daptomycin and ertapenem to finish his course and sent back to the facility on 07/28/2021  He followed up with Dr. Bonney Roussel. He was also was seen in followup with Dr.Taylor with Podiatry with [facility] and later with Dr. Sharol Given here in [place] who has been happy with his progress. After finishing his  IV antibiotics he went back to chronic high dose suppressive therapy with amoxicillin 1 gram po bid.   Mortimer [name] was  then admitted the hospital and was treated for possible cellulitis in the context of superimposed venous stasis changes.  He was given Zyvox IV and then a supply of oral Zyvox which was then extended by his primary care physician.   Tim [name] has continued to follow closely Dr. Sharol Given who has performed local debridement on his foot to the last visit  with him and then also again in October 2023.  Tim [name] appears to be doing relatively well he is suffering from painful neuropathy which is a chronic issue for him.  He says he is feeling better and better.  He had lost weight although he somewhat attributes that to having difficulty tolerating foods in the context of chronic amoxicillin therapy.  He does need to have his pacemaker generator change a year  from now and there is certainly some anxiety about this since none of the local electrophysiologist were willing to undertake this.  His electrophysiologist at [facility] however is willing to do this in roughly a years time.         Past Medical History:  Diagnosis Date   AICD (automatic cardioverter/defibrillator) present    Balanitis    +severe phimosis+ buried penis->circ not possible but dorsal slit done 10/2019   BPH (benign prostatic hypertrophy)    with urinary retention.  Renal u/s 12/04/19 NORMAL KIDNEYS, NORMAL BLADDER, NO HYDRONEPHROSIS   CAD (coronary artery disease)    Nonobstructive by 12/2012 cath; then 03/2016 he required BMS to RCA ([facility]).  In-stent restenosis on cath 11/01/16, baloon angioplasty successful.   CATARACT, HX OF    Chronic combined systolic and diastolic CHF (congestive heart failure) ([place]) 05/2016   Ischemic CM.  04/2018 EF 40-45%, grd I DD.  07/2021 EF 55-60%, AV well seated   Chronic pain syndrome    Lumbar DDD; chronic neuropathic pain (DM); has spinal stimulator and sees pain mgmt MD   Complicated UTI (urinary tract infection) 09/2019   Phimoses, acute urinary retention, entoerococcus UTI, enterococc bacteremia.  F/u blood clx's neg x 5d.     COPD    Debilitated patient    Diabetes mellitus type 2 with complications ([place])    FTD3U jumped from 5.7% to 6.1% 03/2015---started metformin at that time.  DM 2 dx by fasting gluc criteria 2018.  Has chronic neuropath pain   Enterococcal bacteremia 09/2019   Phimoses, acute urinary retention, entoerococcus UTI, enterococc bacteremia.  F/u blood clx's neg x 5d.     Essential hypertension, benign    Fatty liver 2007   2007 u/s showed fatty liver with hepatosplenomegaly.  2019 repeat u/s->fatty liver but no cirrhosis or hepatosplenomegaly.   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 Tommy Medal)   Lumbosacral neuritis    Lumbosacral spondylosis    Lumbar spinal stenosis with neurogenic claudication--contributes to his chronic pain syndrome   Morbid obesity ([place])    Normal memory function 08/2014   Neuropsychological testing ([facility]): no cognitive impairment or sign of neurodegenerative disorder.  Likely has adjustment d/o with mixed anxiety/depressed features and may benefit from low dose SNRI.     Normocytic anemia 03/2016   Mild-pt needs ferritin and vit B12 level checked (as of 03/22/16). Hb stable 09/2019.  Hemoccult x 3 NEG 03/2022   NSTEMI (non-ST elevated myocardial infarction) ([place]) 03/20/2016   BMS to RCA   Obesity hypoventilation syndrome (HCC)    Orthostatic hypotension    OSA on CPAP    8 cm H2O   OSTEOARTHRITIS  Paroxysmal atrial fibrillation ([place]) 2003    (? chronic?) Off anticoag for a while due to falls.  Then apixaban started 12/2014.   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 Ella Bodo): neuropathic pain->diff to treat, failed nucynta, failed spinal stimul trial, oxycontin hs + tramadol + gabap as of 12/2017 f/u Dr. Letta Pate.   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   Sick sinus syndrome (HCC)    PPM placed   Thrombocytopenia ([place]) 2018   HSM on 2007 abd u/s---suspect some mild splenic sequestration chronically.   Unspecified glaucoma(365.9)    Unspecified hereditary and idiopathic peripheral neuropathy approx age 89   bilat LE's, ? left arm, too.  Feet became progressively numb + left foot pain intermittently.  Pt may be trying a spinal stimulator (as of 05/2015)   Vaccine counseling 11/16/2021   VENOUS INSUFFICIENCY    Being followed by Dr. Sharol Given as of 10/2016 for two R LL venous stasis ulcers/skin tears.  Healed as of 10/30/16 f/u with Dr. Sharol Given.   Venous stasis dermatitis 12/27/2021   VENTRAL HERNIA     Past Surgical History:  Procedure Laterality Date   AMPUTATION Left 04/11/2013   Procedure: AMPUTATION DIGIT Left 3rd toe;  Surgeon: [name], MD;  Location: [place];  Service: Orthopedics;  Laterality: Left;  Left 3rd toe amputation at [facility]   AMPUTATION Left 08/29/2019   Procedure: LEFT TRANSMETATARSAL AMPUTATION;  Surgeon: [name], MD;  Location: [place];  Service: Orthopedics;  Laterality: Left;   BIOPSY  05/04/2020   Procedure: BIOPSY;  Surgeon: Jackquline Denmark, MD;  Location: [name] ENDOSCOPY;  Service: Endoscopy;;   [place]; 03/10/16   1997 Non-obstructive disease.  03/2016 BMS to RCA, with 25% pDiag dz, o/w normal cors per cath 03/07/16.  Cath 11/01/16: in stent restenosis, successful baloon angioplasty. 08/2020 branch vessel dz, cont medical therapy rec'd.   CARDIAC CATHETERIZATION  12/24/2012   mild < 20% LCx, prox 30% RCA; LVEF 55-65% , moderate pulmonary HTN, moderate AS   CARDIAC DEFIBRILLATOR PLACEMENT  11/07/2017   [place] MRI Quad CRT defibrillator   CARDIOVASCULAR STRESS TEST  05/11/16 ([facility])   2017 Myocardial perfusion imaging:  No  ischemia; scar in apex, global hypokinesis, EF 36%.  06/15/20->mod primarily fixed inferolat wall defect mildly worse with stress c/w infarct/scar with mild peri-infarct ischemia, normal EF-->for cath per cards.   Carotid dopplers  03/09/2016   [facility]: no hemodynamically significant stenosis on either side.   CHOLECYSTECTOMY     COLONOSCOPY [name] 10/30/2012   Procedure: COLONOSCOPY;  Surgeon: [name], MD;  Location: [facility] ENDOSCOPY;  Service: Endoscopy;  Laterality: [name];   CORONARY ANGIOPLASTY WITH STENT PLACEMENT  03/2016; 04/2017   2017-[facility]: BMS to RCA-pt was placed on Brilinta.  04/2017: DES to RCA.   DORSAL SLIT [name] 10/29/2019   for severe phimosis. Procedure: DORSAL SLIT;  Surgeon: Alexis Frock, MD;  Location: [facility] ORS;  Service: Urology;  Laterality: [name];  [place]  05/04/2020   Procedure: ESOPHAGEAL DILATION;  Surgeon: Jackquline Denmark, MD;  Location[facility] ENDOSCOPY;  Service: Endoscopy;;   ESOPHAGOGASTRODUODENOSCOPY (EGD) WITH PROPOFOL [name] 05/04/2020   Procedure: ESOPHAGOGASTRODUODENOSCOPY (EGD) WITH PROPOFOL;  Surgeon: Jackquline Denmark, MD;  Location: [facility] ENDOSCOPY;  Service: Endoscopy;  Laterality: [name];   EYE SURGERY Bilateral cataract   HEMORRHOID SURGERY     INTRAOCULAR LENS INSERTION Bilateral    KNEE SURGERY Right    LEFT AND RIGHT HEART CATHETERIZATION WITH CORONARY ANGIOGRAM [name] 12/24/2012   Procedure: LEFT AND RIGHT HEART CATHETERIZATION WITH CORONARY ANGIOGRAM;  Surgeon: [name], MD;  Location: [facility] CATH LAB;  Service: Cardiovascular;  Laterality: [name];   LEG SURGERY Bilateral    lenghtening    PACEMAKER PLACEMENT  04/13/2016   2nd deg HB after TAVR, pt had DC MDT PPM placed.   SHOULDER ARTHROSCOPY  08/30/2011   Procedure: ARTHROSCOPY SHOULDER;  Surgeon: [name], MD;  Location: [place];  Service: Orthopedics;  Laterality: Right;  Right Shoulder Arthroscopy, Debridement, and Decompression   SPINAL CORD STIMULATOR INSERTION [name] 09/10/2015    Procedure: LUMBAR SPINAL CORD STIMULATOR INSERTION;  Surgeon: [name], MD;  Location: [place] [facility]O ORS;  Service: Neurosurgery;  Laterality: [name];   TEE WITHOUT CARDIOVERSION [name] 09/16/2019   Procedure: TRANSESOPHAGEAL ECHOCARDIOGRAM (TEE);  Surgeon: Skeet Latch, MD;  Location: [facility] ENDOSCOPY;  Service: Cardiovascular;  Laterality: [name];   TEE WITHOUT CARDIOVERSION [name] 12/02/2019   +vegetation on AVR and pacer lead in RV.  EF 55-60%, normal wall motion.  Valves function normal.  Procedure: TRANSESOPHAGEAL ECHOCARDIOGRAM (TEE);  Surgeon: Geralynn Rile, MD;  Location: [place];  Service: Cardiovascular;  Laterality: [name];   TOE AMPUTATION Left    due to osteomyelitis.  R big toe surg due to osteoarth   TONSILLECTOMY     traeculectomy Left    eye   TRANSCATHETER AORTIC VALVE REPLACEMENT, TRANSFEMORAL  04/11/2016   TRANSESOPHAGEAL ECHOCARDIOGRAM  03/09/2016; 09/2019   [facility]: EF 55-60%, PFO seen with bi-directional shunting, no thrombus in appendage.  09/2019 ->no valvular vegetations. Small patent foramen ovale with predominantly left to right shunting across the interatrial septum.   TRANSTHORACIC ECHOCARDIOGRAM  01/2015; 01/2016; 05/18/16; 09/18/16, 05/2017, 08/2017   01/2015 No signif change in aortic stenosis (moderate).  01/2016 Severe LVH w/small LV cavity, EF 60-65%, grade I diast dysfxn.  05/2016 (s/p TAVR): EF 50-55%, grd I DD, biopros AV good.  08/2016--EF 50-55%, LV septal motion c/w conduction abnl, grd I DD,mild MS,bioprosth aortic valve well seated, w/trace AR. 05/2017 TTE EF 35%. 08/2017-EF 35%, mod diff hypokin LV, grd I DD, biopros AV good.    TRANSTHORACIC ECHOCARDIOGRAM  04/2018; 09/2019   04/2018: EF 40-45%, mod diffuse LV hypokin, grd I DD, bioprosth AV well seated, no AS or AR. 09/2019 EF 60-65%, grd I DD, valves fine, including bioprosth AV. 04/29/20 (tech diff) EF 55-60%, grd I DD, vegetation on MV.  07/2021 EF 55-60% (s/p upgrade to BiV PPM), AV well seated.   VITRECTOMY       Family History  Problem Relation Age of Onset   Hypertension Mother    Coronary artery disease Mother    Heart attack Mother    Neuropathy Mother    Pulmonary fibrosis Father  asbestosis      Social History   Socioeconomic History   Marital status: Married    Spouse name: Not on file   Number of children: 0   Years of education: 20   Highest education level: Not on file  Occupational History   Occupation: Engineer, production    Comment: retired  Tobacco Use   Smoking status: Never   Smokeless tobacco: Never  Scientific laboratory technician Use: Never used  Substance and Sexual Activity   Alcohol use: No    Alcohol/week: 0.0 standard drinks of alcohol   Drug use: No    Types: Oxycodone   Sexual activity: Not Currently  Other Topics Concern   Not on file  Social History Narrative   HSG, [name] - BS, [employer] - MS-engineering, 2 years on PhD - [place]. Married - '65 - 78yr/divorced; '76- 3 yrs/divorced; '92 . No children. 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 ACP/Living Will - Yes CPR; long-term Mechanical ventilation as long as he was able to cognate; ok for long term artificial nutrition. Precondition being able to cognate and not to have too much pain.    Social Determinants of Health   Financial Resource Strain: Not on file  Food Insecurity: Not on file  Transportation Needs: Not on file  Physical Activity: Not on file  Stress: Not on file  Social Connections: Not on file    Allergies  Allergen Reactions   Brimonidine Tartrate Shortness Of Breath    Alphagan-Shortness of breath   Brinzolamide Shortness Of Breath    AZOPT- Shortness of breath   Latanoprost Shortness Of Breath    XALATAN- Shortness of breath   Nucynta [Tapentadol] Shortness Of Breath   Sulfa Antibiotics Palpitations   Timolol Maleate Shortness Of Breath and Other (See Comments)    TIMOPTIC- Aggravated asthma   Diltiazem Swelling     leg swelling    Rofecoxib Swelling     VIOXX- leg swelling   Vancomycin Rash and Other (See Comments)    Blisters   Codeine Other (See Comments)    Childhood reaction   Tamsulosin Other (See Comments)    Dizziness    Celecoxib Other (See Comments)    CELLBREX-confusion   Colchicine Diarrhea    diarrhea   Tape Rash     Current Outpatient Medications:    albuterol (PROVENTIL HFA;VENTOLIN HFA) 108 (90 BASE) MCG/ACT inhaler, Inhale 2 puffs into the lungs 4 (four) times daily as needed for wheezing or shortness of breath., Disp: , Rfl:    allopurinol (ZYLOPRIM) 300 MG tablet, Take 1 tablet (300 mg total) by mouth daily. with food, Disp: 90 tablet, Rfl: 1   amoxicillin (AMOXIL) 500 MG tablet, Take 2 tablets (1,000 mg total) by mouth 2 (two) times daily. Resume taking 6/21 PM after linezolid course completed, Disp: 120 tablet, Rfl: 11   apixaban (ELIQUIS) 5 MG TABS tablet, Take 5 mg by mouth 2 (two) times daily., Disp: , Rfl:    ASPIRIN LOW DOSE 81 MG EC tablet, Take 81 mg by mouth daily., Disp: , Rfl:    B Complex-C (B-COMPLEX WITH VITAMIN C) tablet, Take 1 tablet by mouth daily. Take 1 tablet daily, 2568m Disp: , Rfl:    ezetimibe (ZETIA) 10 MG tablet, Take 1 tablet (10 mg total) by mouth daily., Disp: 30 tablet, Rfl: 1   finasteride (PROSCAR) 5 MG tablet, TAKE 1 TABLET BY MOUTH EVERY DAY, Disp: 90  tablet, Rfl: 0   gabapentin (NEURONTIN) 800 MG tablet, TAKE 1 TABLET BY MOUTH 4 TIMES DAILY., Disp: 360 tablet, Rfl: 1   metoprolol succinate (TOPROL-XL) 25 MG 24 hr tablet, Take 12.5 mg by mouth daily., Disp: , Rfl:    Multiple Vitamin (MULTIVITAMIN ADULT PO), Take 1 tablet by mouth daily., Disp: , Rfl:    niacin (NIASPAN) 1000 MG CR tablet, TAKE 1 TABLET BY MOUTH TWICE A DAY, Disp: 180 tablet, Rfl: 0   nitroGLYCERIN (NITROSTAT) 0.4 MG SL tablet, Place 0.4 mg under the tongue every 5 (five) minutes as needed for chest pain. (Patient not taking: Reported on 05/15/2022), Disp: , Rfl:    Oxycodone HCl 20 MG  TABS, 1 tab po bid prn severe pain, Disp: 60 tablet, Rfl: 0   pantoprazole (PROTONIX) 40 MG tablet, Take 1 tablet (40 mg total) by mouth daily., Disp: 90 tablet, Rfl: 1   potassium chloride SA (KLOR-CON M20) 20 MEQ tablet, Take 1 tablet (20 mEq total) by mouth daily., Disp: 90 tablet, Rfl: 1   Probiotic Product (ALIGN PO), Take by mouth daily., Disp: , Rfl:    rosuvastatin (CRESTOR) 40 MG tablet, take 1 tablet by mouth once daily (Patient taking differently: Take 40 mg by mouth daily.), Disp: 15 tablet, Rfl: 0   sacubitril-valsartan (ENTRESTO) 24-26 MG, Take 1 tablet by mouth 2 (two) times daily. , Disp: , Rfl:    vitamin C (ASCORBIC ACID) 250 MG tablet, Take 250 mg by mouth daily., Disp: , Rfl:    VITAMIN D, CHOLECALCIFEROL, PO, Take 1 tablet by mouth daily. , Disp: , Rfl:    WIXELA INHUB 250-50 MCG/ACT AEPB, INHALE 1 PUFF BY MOUTH TWICE A DAY, Disp: 60 each, Rfl: 5   Review of Systems  Constitutional:  Negative for activity change, appetite change, chills, diaphoresis, fatigue, fever and unexpected weight change.  HENT:  Negative for congestion, rhinorrhea, sinus pressure, sneezing, sore throat and trouble swallowing.   Eyes:  Negative for photophobia and visual disturbance.  Respiratory:  Negative for cough, chest tightness, shortness of breath, wheezing and stridor.   Cardiovascular:  Negative for chest pain, palpitations and leg swelling.  Gastrointestinal:  Negative for abdominal distention, abdominal pain, anal bleeding, blood in stool, constipation, diarrhea, nausea and vomiting.  Genitourinary:  Negative for difficulty urinating, dysuria, flank pain and hematuria.  Musculoskeletal:  Negative for arthralgias, back pain, gait problem, joint swelling and myalgias.  Skin:  Negative for color change, pallor, rash and wound.  Neurological:  Negative for dizziness, tremors, weakness and light-headedness.  Hematological:  Negative for adenopathy. Does not bruise/bleed easily.   Psychiatric/Behavioral:  Negative for agitation, behavioral problems, confusion, decreased concentration, dysphoric mood and sleep disturbance.        Objective:   Physical Exam Constitutional:      Appearance: He is well-developed.  HENT:     Head: Normocephalic and atraumatic.  Eyes:     Conjunctiva/sclera: Conjunctivae normal.  Cardiovascular:     Rate and Rhythm: Normal rate and regular rhythm.  Pulmonary:     Effort: Pulmonary effort is normal. No respiratory distress.     Breath sounds: No wheezing.  Abdominal:     General: There is no distension.     Palpations: Abdomen is soft.  Musculoskeletal:        General: No tenderness. Normal range of motion.     Cervical back: Normal range of motion and neck supple.  Skin:    General: Skin is warm and dry.  Coloration: Skin is not pale.     Findings: No erythema or rash.  Neurological:     General: No focal deficit present.     Mental Status: He is alert and oriented to person, place, and time.  Psychiatric:        Mood and Affect: Mood normal.        Behavior: Behavior normal.        Thought Content: Thought content normal.        Judgment: Judgment normal.       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

## 2022-05-23 ENCOUNTER — Ambulatory Visit: Payer: Medicare Other | Admitting: Orthopedic Surgery

## 2022-06-05 ENCOUNTER — Ambulatory Visit: Payer: Medicare Other | Admitting: Orthopedic Surgery

## 2022-06-13 ENCOUNTER — Ambulatory Visit (INDEPENDENT_AMBULATORY_CARE_PROVIDER_SITE_OTHER): Payer: Medicare Other | Admitting: Orthopedic Surgery

## 2022-06-13 DIAGNOSIS — L97521 Non-pressure chronic ulcer of other part of left foot limited to breakdown of skin: Secondary | ICD-10-CM

## 2022-06-13 DIAGNOSIS — Z89432 Acquired absence of left foot: Secondary | ICD-10-CM

## 2022-06-14 ENCOUNTER — Ambulatory Visit: Payer: Medicare Other | Admitting: Family Medicine

## 2022-06-14 ENCOUNTER — Encounter: Payer: Self-pay | Admitting: Orthopedic Surgery

## 2022-06-14 NOTE — Progress Notes (Signed)
Office Visit Note   Patient: Tim [name]           Date of Birth: 19-Jul-1940           MRN: [id number] Visit Date: 06/13/2022              Requested by: Tammi Sou, MD [address] [place],  [place] [address] PCP: Tammi Sou, MD  Chief Complaint  Patient presents with   Left Foot - Follow-up    Hx transmet  amputation  f/u ulcer       HPI: Patient is an 81 year old gentleman who presents with recurrent ulceration left transmetatarsal amputation.  Patient states he is currently wearing compression stockings.  Assessment & Plan: Visit Diagnoses:  1. Non-pressure chronic ulcer of other part of left foot limited to breakdown of skin ([place])     Plan: Ulcer was debrided there is good healthy granulation tissue good petechial bleeding.  Continue with compression and dry dressing change.  Follow-Up Instructions: Return in about 2 months (around 08/14/2022).   Ortho Exam  [name], oriented, no adenopathy, well-dressed, normal affect, normal respiratory effort. Examination patient has a Wagner grade 1 ulcer over the left transmetatarsal amputation residual limb.  There is no ascending cellulitis no odor or drainage.  After informed consent a 10 blade was used to debride the ulcer back to healthy viable granulation tissue.  The ulcer was 3 cm in diameter 3 mm deep this was touched with silver nitrate.  It did not probe to bone or tendon.  Imaging: No results found. No images are attached to the encounter.  Labs: Lab Results  Component Value Date   HGBA1C 5.9 (A) 05/15/2022   HGBA1C 5.9 05/15/2022   HGBA1C 5.9 05/15/2022   HGBA1C 5.9 05/15/2022   ESRSEDRATE 30 (H) 12/15/2021   ESRSEDRATE 34 (H) 11/24/2020   ESRSEDRATE 38 (H) 07/14/2020   CRP 1.3 11/24/2020   CRP 0.9 07/14/2020   CRP 0.7 05/03/2020   LABURIC 3.5 (L) 09/10/2013   REPTSTATUS 12/15/2021 FINAL 12/14/2021   CULT  12/14/2021    NO GROWTH Performed at [facility], [place] [address] [place], [place] [address]    LABORGA ENTEROCOCCUS FAECALIS 09/11/2019     Lab Results  Component Value Date   ALBUMIN 2.8 (L) 12/15/2021   ALBUMIN 3.2 (L) 12/14/2021   ALBUMIN 3.4 (L) 10/13/2021    Lab Results  Component Value Date   MG 1.7 05/07/2020   MG 1.6 (L) 05/06/2020   MG 1.8 05/05/2020   No results found for: "VD25OH"  No results found for: "PREALBUMIN"    Latest Ref Rng & Units 02/10/2022    4:15 PM 12/27/2021    9:35 AM 12/20/2021   12:40 PM  CBC EXTENDED  WBC 3.8 - 10.8 Thousand/uL 5.1  4.7  4.9   RBC 4.20 - 5.80 Million/uL 3.66  3.52  3.72   Hemoglobin 13.2 - 17.1 g/dL 11.3  11.0  11.7   HCT 38.5 - 50.0 % 33.7  32.9  33.7   Platelets 140 - 400 Thousand/uL 124  76  121.0   NEUT# 1,500 - 7,800 cells/uL  2,355  2.9   Lymph# 850 - 3,900 cells/uL  1,612  1.3      There is no height or weight on file to calculate BMI.  Orders:  No orders of the defined types were placed in this encounter.  No orders of the defined types were  placed in this encounter.    Procedures: No procedures performed  Clinical Data: No additional findings.  ROS:  All other systems negative, except as noted in the HPI. Review of Systems  Objective: Vital Signs: There were no vitals taken for this visit.  Specialty Comments:  No specialty comments available.  PMFS History: Patient Active Problem List   Diagnosis Date Noted   Venous stasis dermatitis 12/27/2021   Cellulitis 12/14/2021   Wound of right lower extremity 12/14/2021   Transient hypotension 12/14/2021   Heart failure with preserved ejection fraction ([place]) 12/14/2021   Prediabetes 12/14/2021   Thrombocytopenia ([place]) 12/14/2021   Obesity, Class III, BMI 40-49.9 (morbid obesity) ([place]) 12/14/2021   BPH (benign prostatic hyperplasia) 12/14/2021   Vaccine counseling 11/16/2021   Weakness 02/23/2021   Physical deconditioning 02/23/2021   Rhinitis 07/14/2020   Mitral valve vegetation 05/28/2020    Non-pressure chronic ulcer of other part of left foot limited to breakdown of skin ([place])    Esophageal dysphagia    Bacteremia    Endocarditis of mitral valve    Fever 04/28/2020   Cervical lymphadenopathy 03/15/2020   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 ([place]) 04/17/2018   Diabetic neuropathy, painful ([place]) 12/26/2016   Neuropathic pain of foot, left 06/20/2016   Insulin resistance 07/22/2015   Status post amputation of left great toe ([place]) 06/08/2015   Spinal stenosis of lumbar region 02/16/2015   Hereditary and idiopathic peripheral neuropathy 01/12/2015   Oral thrush 12/28/2014   Orthostatic dizziness 05/24/2014   Paresthesias with subjective weakness 01/30/2014   Bilateral thigh pain 01/30/2014   Abdominal wall pain in right upper quadrant 01/22/2014   Gross hematuria 09/25/2013   Intertrigo 09/25/2013   IBS (irritable bowel syndrome) 09/11/2013   Chronic pain syndrome 08/06/2013   CAD (coronary artery disease), native coronary  artery 12/25/2012   Paroxysmal atrial fibrillation ([place]) 12/25/2012   Moderate aortic stenosis 12/25/2012   Aortic stenosis 12/24/2012   Personal history of colonic adenoma 10/30/2012   Diverticulosis of colon (without mention of hemorrhage) 10/30/2012   Internal hemorrhoids 10/30/2012   Change in bowel habits intermittent loose stools 10/30/2012   Routine health maintenance 06/09/2012   Hereditary peripheral neuropathy 10/10/2011   Long term current use of anticoagulant - apixiban 08/16/2010   Dyspnea on exertion 09/08/2009   HYPERCHOLESTEROLEMIA-PURE 01/13/2009   Essential hypertension, benign 01/13/2009   EDEMA 01/13/2009   GOUT 01/12/2009   GLAUCOMA 01/12/2009   Venous insufficiency (chronic) (peripheral) 01/12/2009   COPD (chronic obstructive pulmonary disease) ([place]) 01/12/2009   VENTRAL HERNIA 01/12/2009   OSTEOARTHRITIS 01/12/2009   ARTHRITIS 01/12/2009   DEGENERATIVE [place] DISEASE 01/12/2009  CATARACT, HX OF 01/12/2009   HEART MURMUR, HX OF 01/12/2009   BENIGN PROSTATIC HYPERTROPHY, HX OF 01/12/2009   Obesity 01/17/2007   OBSTRUCTIVE SLEEP APNEA 01/17/2007   ASTHMA 01/17/2007   GERD 01/17/2007   HALLUX VALGUS, ACQUIRED 01/17/2007   Pulmonary hypertension ([place]) 01/17/2007   Past Medical History:  Diagnosis Date   AICD (automatic cardioverter/defibrillator) present    Balanitis    +severe phimosis+ buried penis->circ not possible but dorsal slit done 10/2019   BPH (benign prostatic hypertrophy)    with urinary retention.  Renal u/s 12/04/19 NORMAL KIDNEYS, NORMAL BLADDER, NO HYDRONEPHROSIS   CAD (coronary artery disease)    Nonobstructive by 12/2012 cath; then 03/2016 he required BMS to RCA ([facility]).  In-stent restenosis on cath 11/01/16, baloon angioplasty successful.   CATARACT, HX OF    Chronic combined systolic and diastolic CHF (congestive heart failure) ([place]) 05/2016   Ischemic CM.  04/2018 EF 40-45%, grd I DD.  07/2021 EF 55-60%, AV well seated   Chronic pain  syndrome    Lumbar DDD; chronic neuropathic pain (DM); has spinal stimulator and sees pain mgmt MD   Complicated UTI (urinary tract infection) 09/2019   Phimoses, acute urinary retention, entoerococcus UTI, enterococc bacteremia.  F/u blood clx's neg x 5d.     COPD    Debilitated patient    Diabetes mellitus type 2 with complications ([place])    OIZ1I jumped from 5.7% to 6.1% 03/2015---started metformin at that time.  DM 2 dx by fasting gluc criteria 2018.  Has chronic neuropath pain   Enterococcal bacteremia 09/2019   Phimoses, acute urinary retention, entoerococcus UTI, enterococc bacteremia.  F/u blood clx's neg x 5d.     Essential hypertension, benign    Fatty liver 2007   2007 u/s showed fatty liver with hepatosplenomegaly.  2019 repeat u/s->fatty liver but no cirrhosis or hepatosplenomegaly.   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 Tommy Medal)   Lumbosacral neuritis    Lumbosacral spondylosis    Lumbar spinal stenosis with neurogenic claudication--contributes to his chronic pain syndrome   Morbid obesity ([place])    Normal memory function 08/2014   Neuropsychological testing ([facility]): no cognitive impairment or sign of neurodegenerative disorder.  Likely has adjustment d/o with mixed anxiety/depressed features and may benefit from low dose SNRI.     Normocytic anemia 03/2016   Mild-pt needs ferritin and vit B12 level checked (as of 03/22/16). Hb stable 09/2019.  Hemoccult x 3 NEG 03/2022   NSTEMI (non-ST elevated myocardial infarction) ([place]) 03/20/2016   BMS to RCA   Obesity hypoventilation syndrome (HCC)     Orthostatic hypotension    OSA on CPAP    8 cm H2O   OSTEOARTHRITIS    Paroxysmal atrial fibrillation ([place]) 2003    (? chronic?) Off anticoag for a while due to falls.  Then apixaban started 12/2014.   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 Ella Bodo): neuropathic pain->diff to treat, failed nucynta, failed spinal stimul trial, oxycontin hs + tramadol + gabap as of 12/2017 f/u Dr. Letta Pate.   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   Sick sinus syndrome (HCC)    PPM placed   Thrombocytopenia ([place]) 2018   HSM on 2007 abd u/s---suspect some mild splenic sequestration chronically.   Unspecified glaucoma(365.9)    Unspecified hereditary and idiopathic peripheral neuropathy approx age 25   bilat LE's, ? left arm, too.  Feet became progressively numb + left foot pain intermittently.  Pt may be trying a spinal stimulator (as of 05/2015)   Vaccine counseling 11/16/2021   VENOUS INSUFFICIENCY    Being followed by Dr. Sharol Given as of 10/2016 for two R LL venous stasis ulcers/skin tears.  Healed as of 10/30/16 f/u with Dr. Sharol Given.   Venous stasis dermatitis 12/27/2021   VENTRAL HERNIA     Family History  Problem Relation Age of Onset   Hypertension Mother    Coronary artery disease Mother     Heart attack Mother    Neuropathy Mother    Pulmonary fibrosis Father        asbestosis    Past Surgical History:  Procedure Laterality Date   AMPUTATION Left 04/11/2013   Procedure: AMPUTATION DIGIT Left 3rd toe;  Surgeon: [name], MD;  Location: [facility];  Service: Orthopedics;  Laterality: Left;  Left 3rd toe amputation at [facility]   AMPUTATION Left 08/29/2019   Procedure: LEFT TRANSMETATARSAL AMPUTATION;  Surgeon: [name], MD;  Location: [facility];  Service: Orthopedics;  Laterality: Left;   BIOPSY  05/04/2020   Procedure: BIOPSY;  Surgeon: Jackquline Denmark, MD;  Location: [facility] ENDOSCOPY;  Service: Endoscopy;;   [place]; 03/10/16   1997 Non-obstructive disease.  03/2016 BMS to RCA, with 25% pDiag dz, o/w normal cors per cath 03/07/16.  Cath 11/01/16: in stent restenosis, successful baloon angioplasty. 08/2020 branch vessel dz, cont medical therapy rec'd.   CARDIAC CATHETERIZATION  12/24/2012   mild < 20% LCx, prox 30% RCA; LVEF 55-65% , moderate pulmonary HTN, moderate AS   CARDIAC DEFIBRILLATOR PLACEMENT  11/07/2017   [facility] MRI Quad CRT defibrillator   CARDIOVASCULAR STRESS TEST  05/11/16 ([facility])   2017 Myocardial perfusion imaging:  No ischemia; scar in apex, global hypokinesis, EF 36%.  06/15/20->mod primarily fixed inferolat wall defect mildly worse with stress c/w infarct/scar with mild peri-infarct ischemia, normal EF-->for cath per cards.   Carotid dopplers  03/09/2016   [facility]: no hemodynamically significant stenosis on either side.   CHOLECYSTECTOMY     COLONOSCOPY [name] 10/30/2012   Procedure: COLONOSCOPY;  Surgeon: [name], MD;  Location: [facility] ENDOSCOPY;  Service: Endoscopy;  Laterality: [name];   CORONARY ANGIOPLASTY WITH STENT PLACEMENT  03/2016; 04/2017   2017-[facility]: BMS to RCA-pt was placed on Brilinta.  04/2017: DES to RCA.   DORSAL SLIT [name] 10/29/2019   for severe phimosis. Procedure: DORSAL SLIT;  Surgeon: Alexis Frock, MD;  Location: [facility] ORS;   Service: Urology;  Laterality: [name];  [place]  05/04/2020   Procedure: ESOPHAGEAL DILATION;  Surgeon: Jackquline Denmark, MD;  Location: [facility] ENDOSCOPY;  Service: Endoscopy;;   ESOPHAGOGASTRODUODENOSCOPY (EGD) WITH PROPOFOL [name] 05/04/2020   Procedure: ESOPHAGOGASTRODUODENOSCOPY (EGD) WITH PROPOFOL;  Surgeon: Jackquline Denmark, MD;  Location: [facility] ENDOSCOPY;  Service: Endoscopy;  Laterality: [name];   EYE SURGERY Bilateral cataract   HEMORRHOID SURGERY     INTRAOCULAR LENS INSERTION Bilateral    KNEE SURGERY Right    LEFT AND RIGHT HEART CATHETERIZATION WITH CORONARY ANGIOGRAM [name] 12/24/2012   Procedure: LEFT AND RIGHT HEART CATHETERIZATION WITH CORONARY ANGIOGRAM;  Surgeon: [name], MD;  Location: [facility] CATH LAB;  Service: Cardiovascular;  Laterality: [name];   LEG SURGERY Bilateral    lenghtening    PACEMAKER PLACEMENT  04/13/2016   2nd deg HB after TAVR, pt had DC MDT PPM placed.   SHOULDER ARTHROSCOPY  08/30/2011   Procedure: ARTHROSCOPY SHOULDER;  Surgeon: [name], MD;  Location: [facility];  Service: Orthopedics;  Laterality: Right;  Right Shoulder Arthroscopy, Debridement, and Decompression   SPINAL CORD STIMULATOR INSERTION [name] 09/10/2015   Procedure: LUMBAR SPINAL CORD STIMULATOR INSERTION;  Surgeon: [name], MD;  Location: [facility] NEURO ORS;  Service: Neurosurgery;  Laterality: [name];   TEE WITHOUT CARDIOVERSION [name] 09/16/2019   Procedure: TRANSESOPHAGEAL ECHOCARDIOGRAM (TEE);  Surgeon: Skeet Latch, MD;  Location: [name] ENDOSCOPY;  Service: Cardiovascular;  Laterality: [name];   TEE WITHOUT CARDIOVERSION [name] 12/02/2019   +vegetation on AVR and pacer lead in RV.  EF 55-60%, normal wall motion.  Valves function normal.  Procedure: TRANSESOPHAGEAL ECHOCARDIOGRAM (TEE);  Surgeon: Geralynn Rile, MD;  Location: [facility];  Service: Cardiovascular;  Laterality: [name];   TOE AMPUTATION Left    due to osteomyelitis.  R big toe surg due to osteoarth   TONSILLECTOMY      traeculectomy Left    eye   TRANSCATHETER AORTIC VALVE REPLACEMENT, TRANSFEMORAL  04/11/2016   TRANSESOPHAGEAL ECHOCARDIOGRAM  03/09/2016; 09/2019   [facility]: EF 55-60%, PFO seen with bi-directional shunting, no thrombus in appendage.  09/2019 ->no valvular vegetations. Small patent foramen ovale with predominantly left to right shunting across the interatrial septum.   TRANSTHORACIC ECHOCARDIOGRAM  01/2015; 01/2016; 05/18/16; 09/18/16, 05/2017, 08/2017   01/2015 No signif change in aortic stenosis (moderate).  01/2016 Severe LVH w/small LV cavity, EF 60-65%, grade I diast dysfxn.  05/2016 (s/p TAVR): EF 50-55%, grd I DD, biopros AV good.  08/2016--EF 50-55%, LV septal motion c/w conduction abnl, grd I DD,mild MS,bioprosth aortic valve well seated, w/trace AR. 05/2017 TTE EF 35%. 08/2017-EF 35%, mod diff hypokin LV, grd I DD, biopros AV good.    TRANSTHORACIC ECHOCARDIOGRAM  04/2018; 09/2019   04/2018: EF 40-45%, mod diffuse LV hypokin, grd I DD, bioprosth AV well seated, no AS or AR. 09/2019 EF 60-65%, grd I DD, valves fine, including bioprosth AV. 04/29/20 (tech diff) EF 55-60%, grd I DD, vegetation on MV.  07/2021 EF 55-60% (s/p upgrade to BiV PPM), AV well seated.   VITRECTOMY     Social History   Occupational History   Occupation: Engineer, production    Comment: retired  Tobacco Use   Smoking status: Never   Smokeless tobacco: Never  Vaping Use   Vaping Use: Never used  Substance and Sexual Activity   Alcohol use: No    Alcohol/week: 0.0 standard drinks of alcohol   Drug use: No  Types: Oxycodone   Sexual activity: Not Currently

## 2022-06-15 ENCOUNTER — Encounter: Payer: Self-pay | Admitting: Family Medicine

## 2022-06-15 ENCOUNTER — Ambulatory Visit: Payer: Medicare Other | Admitting: Family Medicine

## 2022-06-15 VITALS — BP 123/74 | HR 71 | Temp 97.6°F | Ht 76.0 in

## 2022-06-15 DIAGNOSIS — J441 Chronic obstructive pulmonary disease with (acute) exacerbation: Secondary | ICD-10-CM

## 2022-06-15 DIAGNOSIS — J180 Bronchopneumonia, unspecified organism: Secondary | ICD-10-CM | POA: Diagnosis not present

## 2022-06-15 LAB — POCT INFLUENZA A/B
Influenza A, POC: NEGATIVE
Influenza B, POC: NEGATIVE

## 2022-06-15 LAB — POC COVID19 BINAXNOW: SARS Coronavirus 2 Ag: NEGATIVE

## 2022-06-15 MED ORDER — AMOXICILLIN-POT CLAVULANATE 875-125 MG PO TABS: 1.0000 | ORAL_TABLET | Freq: Two times a day (BID) | ORAL | 0 refills | Status: DC

## 2022-06-15 MED ORDER — PREDNISONE 20 MG PO TABS: ORAL_TABLET | ORAL | 0 refills | Status: DC

## 2022-06-15 MED ORDER — AZITHROMYCIN 250 MG PO TABS: ORAL_TABLET | ORAL | 0 refills | Status: DC

## 2022-06-15 NOTE — Patient Instructions (Signed)
Stop your amoxicillin for 10 days while you are on the other antibiotics.

## 2022-06-15 NOTE — Progress Notes (Signed)
OFFICE VISIT  06/15/2022  CC:  Chief Complaint  Patient presents with   Chest congestion    Has tried taking Robitussin DM qid, nasal spray qid   Cough    Productive, 3 weeks. Phlegm has been dark yellow but most times, it is clear. Gotten worse in the last week. Denies fever, sore throat.   Nasal Congestion    Runny nose   Patient is a 81 y.o. male who presents for cold symptoms.  HPI: Onset approximately 3 weeks ago of nasal congestion, postnasal drip, and significant cough and wheeze.  His coughing and wheezing has gotten progressively worse and he is producing thick sputum. No fever, no headache, no sore throat.  P.o. intake is fine. Mild shortness of breath with ambulation a bit worse than his baseline.  No chest pain.  No lower extremity swelling.  ROS as above, plus-->  no dizziness, no rashes, no melena/hematochezia.  No polyuria or polydipsia.  No myalgias or arthralgias.  No focal weakness, paresthesias, or tremors.  No acute vision or hearing abnormalities.  No dysuria or unusual/new urinary urgency or frequency.  No recent changes in lower legs. No n/v/d or abd pain.  No palpitations.     Past Medical History:  Diagnosis Date   AICD (automatic cardioverter/defibrillator) present    Balanitis    +severe phimosis+ buried penis->circ not possible but dorsal slit done 10/2019   BPH (benign prostatic hypertrophy)    with urinary retention.  Renal u/s 12/04/19 NORMAL KIDNEYS, NORMAL BLADDER, NO HYDRONEPHROSIS   CAD (coronary artery disease)    Nonobstructive by 12/2012 cath; then 03/2016 he required BMS to RCA ([facility]).  In-stent restenosis on cath 11/01/16, baloon angioplasty successful.   CATARACT, HX OF    Chronic combined systolic and diastolic CHF (congestive heart failure) ([place]) 05/2016   Ischemic CM.  04/2018 EF 40-45%, grd I DD.  07/2021 EF 55-60%, AV well seated   Chronic pain syndrome    Lumbar DDD; chronic neuropathic pain (DM); has spinal stimulator and sees pain mgmt  MD   Complicated UTI (urinary tract infection) 09/2019   Phimoses, acute urinary retention, entoerococcus UTI, enterococc bacteremia.  F/u blood clx's neg x 5d.     COPD    Debilitated patient    Diabetes mellitus type 2 with complications ([place])    IOE7O jumped from 5.7% to 6.1% 03/2015---started metformin at that time.  DM 2 dx by fasting gluc criteria 2018.  Has chronic neuropath pain   Enterococcal bacteremia 09/2019   Phimoses, acute urinary retention, entoerococcus UTI, enterococc bacteremia.  F/u blood clx's neg x 5d.     Essential hypertension, benign    Fatty liver 2007   2007 u/s showed fatty liver with hepatosplenomegaly.  2019 repeat u/s->fatty liver but no cirrhosis or hepatosplenomegaly.   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 Tommy Medal)   Lumbosacral neuritis    Lumbosacral spondylosis    Lumbar spinal stenosis with neurogenic claudication--contributes to his chronic pain syndrome   Morbid obesity ([place])    Normal memory function 08/2014   Neuropsychological testing ([facility]): no cognitive impairment or  sign of neurodegenerative disorder.  Likely has adjustment d/o with mixed anxiety/depressed features and may benefit from low dose SNRI.     Normocytic anemia 03/2016   Mild-pt needs ferritin and vit B12 level checked (as of 03/22/16). Hb stable 09/2019.  Hemoccult x 3 NEG 03/2022   NSTEMI (non-ST elevated myocardial infarction) ([facility]) 03/20/2016   BMS to RCA   Obesity hypoventilation syndrome (HCC)    Orthostatic hypotension    OSA on CPAP    8 cm H2O   OSTEOARTHRITIS    Paroxysmal atrial  fibrillation ([place]) 2003    (? chronic?) Off anticoag for a while due to falls.  Then apixaban started 12/2014.   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 Ella Bodo): neuropathic pain->diff to treat, failed nucynta, failed spinal stimul trial, oxycontin hs + tramadol + gabap as of 12/2017 f/u Dr. Letta Pate.   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   Sick sinus syndrome (HCC)    PPM placed   Thrombocytopenia ([place]) 2018   HSM on 2007 abd u/s---suspect some mild splenic sequestration chronically.   Unspecified glaucoma(365.9)    Unspecified hereditary and idiopathic peripheral neuropathy approx age 6   bilat LE's, ? left arm, too.  Feet became progressively numb + left foot pain intermittently.  Pt may be trying a spinal stimulator (as of 05/2015)   Vaccine counseling 11/16/2021   VENOUS INSUFFICIENCY    Being followed by Dr. Sharol Given as of 10/2016 for two R LL venous stasis ulcers/skin tears.  Healed as of 10/30/16 f/u with Dr. Sharol Given.   Venous stasis dermatitis 12/27/2021   VENTRAL HERNIA     Past Surgical History:  Procedure Laterality Date   AMPUTATION Left 04/11/2013   Procedure: AMPUTATION DIGIT Left 3rd toe;  Surgeon: [name], MD;  Location: [place];  Service: Orthopedics;   Laterality: Left;  Left 3rd toe amputation at [facility]   AMPUTATION Left 08/29/2019   Procedure: LEFT TRANSMETATARSAL AMPUTATION;  Surgeon: [name], MD;  Location: [place];  Service: Orthopedics;  Laterality: Left;   BIOPSY  05/04/2020   Procedure: BIOPSY;  Surgeon: Jackquline Denmark, MD;  Location: [facility] ENDOSCOPY;  Service: Endoscopy;;   [place]; 03/10/16   1997 Non-obstructive disease.  03/2016 BMS to RCA, with 25% pDiag dz, o/w normal cors per cath 03/07/16.  Cath 11/01/16: in stent restenosis, successful baloon angioplasty. 08/2020 branch vessel dz, cont medical therapy rec'd.   CARDIAC CATHETERIZATION  12/24/2012   mild < 20% LCx, prox 30% RCA; LVEF 55-65% , moderate pulmonary HTN, moderate AS   CARDIAC DEFIBRILLATOR PLACEMENT  11/07/2017   [place] MRI Quad CRT defibrillator   CARDIOVASCULAR STRESS TEST  05/11/16 ([facility])   2017 Myocardial perfusion imaging:  No ischemia; scar in apex,  global hypokinesis, EF 36%.  06/15/20->mod primarily fixed inferolat wall defect mildly worse with stress c/w infarct/scar with mild peri-infarct ischemia, normal EF-->for cath per cards.   Carotid dopplers  03/09/2016   [facility]: no hemodynamically significant stenosis on either side.   CHOLECYSTECTOMY     COLONOSCOPY [name] 10/30/2012   Procedure: COLONOSCOPY;  Surgeon: [name], MD;  Location: [facility] ENDOSCOPY;  Service: Endoscopy;  Laterality: [name];   CORONARY ANGIOPLASTY WITH STENT PLACEMENT  03/2016; 04/2017   2017-[facility]: BMS to RCA-pt was placed on Brilinta.  04/2017: DES to RCA.   DORSAL SLIT [name] 10/29/2019   for severe phimosis. Procedure: DORSAL SLIT;  Surgeon: Alexis Frock, MD;  Location: [facility];  Service: Urology;  Laterality: [name];  [place]  05/04/2020   Procedure: ESOPHAGEAL DILATION;  Surgeon: Jackquline Denmark, MD;  Location: [facility] ENDOSCOPY;  Service: Endoscopy;;   ESOPHAGOGASTRODUODENOSCOPY (EGD) WITH PROPOFOL [name] 05/04/2020   Procedure: ESOPHAGOGASTRODUODENOSCOPY  (EGD) WITH PROPOFOL;  Surgeon: Jackquline Denmark, MD;  Location: [facility] ENDOSCOPY;  Service: Endoscopy;  Laterality: [name];   EYE SURGERY Bilateral cataract   HEMORRHOID SURGERY     INTRAOCULAR LENS INSERTION Bilateral    KNEE SURGERY Right    LEFT AND RIGHT HEART CATHETERIZATION WITH CORONARY ANGIOGRAM [name] 12/24/2012   Procedure: LEFT AND RIGHT HEART CATHETERIZATION WITH CORONARY ANGIOGRAM;  Surgeon: [name], MD;  Location: [name] CATH LAB;  Service: Cardiovascular;  Laterality: [name];   LEG SURGERY Bilateral    lenghtening    PACEMAKER PLACEMENT  04/13/2016   2nd deg HB after TAVR, pt had DC MDT PPM placed.   SHOULDER ARTHROSCOPY  08/30/2011   Procedure: ARTHROSCOPY SHOULDER;  Surgeon: [name], MD;  Location: [place];  Service: Orthopedics;  Laterality: Right;  Right Shoulder Arthroscopy, Debridement, and Decompression   SPINAL CORD STIMULATOR INSERTION [name] 09/10/2015   Procedure: LUMBAR SPINAL CORD STIMULATOR INSERTION;  Surgeon: [name], MD;  Location: [place] [facility]URO ORS;  Service: Neurosurgery;  Laterality: [name];   TEE WITHOUT CARDIOVERSION [name] 09/16/2019   Procedure: TRANSESOPHAGEAL ECHOCARDIOGRAM (TEE);  Surgeon: Skeet Latch, MD;  Location: [facility] ENDOSCOPY;  Service: Cardiovascular;  Laterality: [name];   TEE WITHOUT CARDIOVERSION [name] 12/02/2019   +vegetation on AVR and pacer lead in RV.  EF 55-60%, normal wall motion.  Valves function normal.  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  R big toe surg due to osteoarth   TONSILLECTOMY     traeculectomy Left    eye   TRANSCATHETER AORTIC VALVE REPLACEMENT, TRANSFEMORAL  04/11/2016   TRANSESOPHAGEAL ECHOCARDIOGRAM  03/09/2016; 09/2019   [facility]: EF 55-60%, PFO seen with bi-directional shunting, no thrombus in appendage.  09/2019 ->no valvular vegetations. Small patent foramen ovale with  predominantly left to right shunting across the interatrial septum.   TRANSTHORACIC ECHOCARDIOGRAM  01/2015; 01/2016; 05/18/16; 09/18/16, 05/2017, 08/2017   01/2015 No signif change in aortic stenosis (moderate).  01/2016 Severe LVH w/small LV cavity, EF 60-65%, grade I diast dysfxn.  05/2016 (s/p TAVR): EF 50-55%, grd I DD, biopros AV good.  08/2016--EF 50-55%, LV septal motion c/w conduction abnl, grd I DD,mild MS,bioprosth aortic valve well seated, w/trace AR. 05/2017 TTE EF 35%. 08/2017-EF 35%, mod diff hypokin LV, grd I DD, biopros AV good.    TRANSTHORACIC ECHOCARDIOGRAM  04/2018; 09/2019   04/2018: EF 40-45%, mod diffuse LV hypokin, grd  I DD, bioprosth AV well seated, no AS or AR. 09/2019 EF 60-65%, grd I DD, valves fine, including bioprosth AV. 04/29/20 (tech diff) EF 55-60%, grd I DD, vegetation on MV.  07/2021 EF 55-60% (s/p upgrade to BiV PPM), AV well seated.   VITRECTOMY      Outpatient Medications Prior to Visit  Medication Sig Dispense Refill   albuterol (PROVENTIL HFA;VENTOLIN HFA) 108 (90 BASE) MCG/ACT inhaler Inhale 2 puffs into the lungs 4 (four) times daily as needed for wheezing or shortness of breath.     allopurinol (ZYLOPRIM) 300 MG tablet Take 1 tablet (300 mg total) by mouth daily. with food 90 tablet 1   amoxicillin (AMOXIL) 500 MG tablet Take 2 tablets (1,000 mg total) by mouth 2 (two) times daily. Resume taking 6/21 PM after linezolid course completed 120 tablet 11   apixaban (ELIQUIS) 5 MG TABS tablet Take 5 mg by mouth 2 (two) times daily.     ASPIRIN LOW DOSE 81 MG EC tablet Take 81 mg by mouth daily.     B Complex-C (B-COMPLEX WITH VITAMIN C) tablet Take 1 tablet by mouth daily. Take 1 tablet daily, 210m     ezetimibe (ZETIA) 10 MG tablet Take 1 tablet (10 mg total) by mouth daily. 30 tablet 1   finasteride (PROSCAR) 5 MG tablet TAKE 1 TABLET BY MOUTH EVERY DAY 90 tablet 0   gabapentin (NEURONTIN) 800 MG tablet TAKE 1 TABLET BY MOUTH 4 TIMES DAILY. 360 tablet 1   metoprolol  succinate (TOPROL-XL) 25 MG 24 hr tablet Take 12.5 mg by mouth daily.     Multiple Vitamin (MULTIVITAMIN ADULT PO) Take 1 tablet by mouth daily.     niacin (NIASPAN) 1000 MG CR tablet TAKE 1 TABLET BY MOUTH TWICE A DAY 180 tablet 0   nitroGLYCERIN (NITROSTAT) 0.4 MG SL tablet Place 0.4 mg under the tongue every 5 (five) minutes as needed for chest pain.     Oxycodone HCl 20 MG TABS 1 tab po bid prn severe pain 60 tablet 0   pantoprazole (PROTONIX) 40 MG tablet Take 1 tablet (40 mg total) by mouth daily. 90 tablet 1   potassium chloride SA (KLOR-CON M20) 20 MEQ tablet Take 1 tablet (20 mEq total) by mouth daily. 90 tablet 1   Probiotic Product (ALIGN PO) Take by mouth daily.     rosuvastatin (CRESTOR) 40 MG tablet take 1 tablet by mouth once daily (Patient taking differently: Take 40 mg by mouth daily.) 15 tablet 0   sacubitril-valsartan (ENTRESTO) 24-26 MG Take 1 tablet by mouth 2 (two) times daily.      vitamin C (ASCORBIC ACID) 250 MG tablet Take 250 mg by mouth daily.     VITAMIN D, CHOLECALCIFEROL, PO Take 1 tablet by mouth daily.      WIXELA INHUB 250-50 MCG/ACT AEPB INHALE 1 PUFF BY MOUTH TWICE A DAY 60 each 5   No facility-administered medications prior to visit.    Allergies  Allergen Reactions   Brimonidine Tartrate Shortness Of Breath    Alphagan-Shortness of breath   Brinzolamide Shortness Of Breath    AZOPT- Shortness of breath   Latanoprost Shortness Of Breath    XALATAN- Shortness of breath   Nucynta [Tapentadol] Shortness Of Breath   Sulfa Antibiotics Palpitations   Timolol Maleate Shortness Of Breath and Other (See Comments)    TIMOPTIC- Aggravated asthma   Diltiazem Swelling     leg swelling   Rofecoxib Swelling     VIOXX-  leg swelling   Vancomycin Rash and Other (See Comments)    Blisters   Codeine Other (See Comments)    Childhood reaction   Tamsulosin Other (See Comments)    Dizziness    Celecoxib Other (See Comments)    CELLBREX-confusion   Colchicine  Diarrhea    diarrhea   Tape Rash    ROS As per HPI  PE:    06/15/2022    1:08 PM 05/22/2022    4:32 PM 05/15/2022    3:34 PM  Vitals with BMI  Height 6' 4"  6' 4"  6' 4"   Weight  304 lbs 6 oz 304 lbs 6 oz  BMI  64.40 34.74  Systolic 259 563 875  Diastolic 74 76 83  Pulse 71 73 80     Physical Exam  VS: noted--normal. Gen: alert, NAD, NONTOXIC APPEARING. HEENT: eyes without injection, drainage, or swelling.  Ears: EACs clear, TMs with normal light reflex and landmarks.  Nose: Clear rhinorrhea, with some dried, crusty exudate adherent to mildly injected mucosa.  No purulent d/c.  No paranasal sinus TTP.  No facial swelling.  Throat and mouth without focal lesion.  No pharyngial swelling, erythema, or exudate.   Neck: supple, no LAD.   LUNGS: Trace exp wheezing and prolonged exp phase.  nonlabored resps.  Question of subtle R base insp crackles vs transmitted bowel sounds.   CV: RRR, no m/r/g. EXT: trace bilat LL pitting edema SKIN: no rash   LABS:  Last CBC Lab Results  Component Value Date   WBC 5.1 02/10/2022   HGB 11.3 (L) 02/10/2022   HCT 33.7 (L) 02/10/2022   MCV 92.1 02/10/2022   MCH 30.9 02/10/2022   RDW 14.3 02/10/2022   PLT 124 (L) 02/10/2022   Lab Results  Component Value Date   IRON 173 12/20/2021   TIBC 262 12/20/2021   FERRITIN 60 64/33/2951   Last metabolic panel Lab Results  Component Value Date   GLUCOSE 130 (H) 02/10/2022   NA 141 02/10/2022   K 4.1 02/10/2022   CL 107 02/10/2022   CO2 27 02/10/2022   BUN 12 02/10/2022   CREATININE 0.67 (L) 02/10/2022   GFRNONAA >60 12/15/2021   CALCIUM 9.3 02/10/2022   PROT 6.1 02/10/2022   ALBUMIN 2.8 (L) 12/15/2021   BILITOT 0.6 02/10/2022   ALKPHOS 49 12/15/2021   AST 24 02/10/2022   ALT 17 02/10/2022   ANIONGAP 10 12/15/2021   Last hemoglobin A1c Lab Results  Component Value Date   HGBA1C 5.9 (A) 05/15/2022   HGBA1C 5.9 05/15/2022   HGBA1C 5.9 05/15/2022   HGBA1C 5.9 05/15/2022    IMPRESSION AND PLAN:  Acute bronchopneumonia. Stop amoxil. Start augmentin 875 bid x 10d + azith x 5d. Restart amoxil when finished with augmentin. Prednisone 92m qd x 2d, then 436mqd x 4d then 2058md x 4d. Cont wixela and prn albuterol.  An After Visit Summary was printed and given to the patient.  FOLLOW UP: Return for 5-7d f/u resp illness.  Signed:  PhiCrissie SicklesD           06/15/2022

## 2022-06-20 ENCOUNTER — Ambulatory Visit: Payer: Medicare Other | Admitting: Family Medicine

## 2022-06-20 ENCOUNTER — Encounter: Payer: Self-pay | Admitting: Family Medicine

## 2022-06-20 VITALS — BP 118/71 | HR 110 | Temp 96.9°F | Ht 76.0 in | Wt 302.2 lb

## 2022-06-20 DIAGNOSIS — Z23 Encounter for immunization: Secondary | ICD-10-CM

## 2022-06-20 DIAGNOSIS — J441 Chronic obstructive pulmonary disease with (acute) exacerbation: Secondary | ICD-10-CM

## 2022-06-20 DIAGNOSIS — J180 Bronchopneumonia, unspecified organism: Secondary | ICD-10-CM

## 2022-06-20 MED ORDER — METHYLPREDNISOLONE ACETATE 80 MG/ML IJ SUSP
80.0000 mg | Freq: Once | INTRAMUSCULAR | Status: AC
  Administered 2022-06-20: 80 mg via INTRAMUSCULAR

## 2022-06-20 MED ORDER — ALBUTEROL SULFATE HFA 108 (90 BASE) MCG/ACT IN AERS: 2.0000 | INHALATION_SPRAY | Freq: Four times a day (QID) | RESPIRATORY_TRACT | 0 refills | Status: DC | PRN

## 2022-06-20 NOTE — Progress Notes (Signed)
OFFICE VISIT  06/20/2022  CC:  Chief Complaint  Patient presents with   Follow-up   Patient is a 81 y.o. male who presents for 5-day follow-up bronchopneumonia. A/P as of last visit: "Acute bronchopneumonia. Stop amoxil. Start augmentin 875 bid x 10d + azith x 5d. Restart amoxil when finished with augmentin. Prednisone 70m qd x 2d, then 421mqd x 4d then 2081md x 4d. Cont wixela and prn albuterol."  INTERIM HX: BobMikki Santeeys he is feeling somewhat better.  Offing less often although he does have significant prolongation of his coughing when he does start. No fever.  He does have wheezing.  He does not have an albuterol rescue inhaler. He takes WixIraqily but he also has Dulera at home and wonders if he should use this.  ROS: No significant change in lower extremities, no chest pain, no dizziness, no palpitations.  Past Medical History:  Diagnosis Date   AICD (automatic cardioverter/defibrillator) present    Balanitis    +severe phimosis+ buried penis->circ not possible but dorsal slit done 10/2019   BPH (benign prostatic hypertrophy)    with urinary retention.  Renal u/s 12/04/19 NORMAL KIDNEYS, NORMAL BLADDER, NO HYDRONEPHROSIS   CAD (coronary artery disease)    Nonobstructive by 12/2012 cath; then 03/2016 he required BMS to RCA ([facility]).  In-stent restenosis on cath 11/01/16, baloon angioplasty successful.   CATARACT, HX OF    Chronic combined systolic and diastolic CHF (congestive heart failure) (HCCTobias1/2017   Ischemic CM.  04/2018 EF 40-45%, grd I DD.  07/2021 EF 55-60%, AV well seated   Chronic pain syndrome    Lumbar DDD; chronic neuropathic pain (DM); has spinal stimulator and sees pain mgmt MD   Complicated UTI (urinary tract infection) 09/2019   Phimoses, acute urinary retention, entoerococcus UTI, enterococc bacteremia.  F/u blood clx's neg x 5d.     COPD    Debilitated patient    Diabetes mellitus type 2 with complications (HCCMer [place]  HbAVWU9Wmped from 5.7% to 6.1%  03/2015---started metformin at that time.  DM 2 dx by fasting gluc criteria 2018.  Has chronic neuropath pain   Enterococcal bacteremia 09/2019   Phimoses, acute urinary retention, entoerococcus UTI, enterococc bacteremia.  F/u blood clx's neg x 5d.     Essential hypertension, benign    Fatty liver 2007   2007 u/s showed fatty liver with hepatosplenomegaly.  2019 repeat u/s->fatty liver but no cirrhosis or hepatosplenomegaly.   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 VanTommy Medal Lumbosacral neuritis    Lumbosacral spondylosis    Lumbar spinal stenosis with neurogenic claudication--contributes to his chronic pain syndrome   Morbid obesity ([place]CC[place]  Normal memory function 08/2014   Neuropsychological testing ([facility]): no cognitive impairment or sign of neurodegenerative disorder.  Likely has adjustment d/o with mixed anxiety/depressed features and may benefit from low dose SNRI.     Normocytic anemia 03/2016   Mild-pt needs ferritin and vit B12 level checked (as of 03/22/16). Hb stable 09/2019.  Hemoccult x 3 NEG 03/2022   NSTEMI (non-ST elevated myocardial infarction) (HCCCarson9/18/2017  BMS to RCA   Obesity hypoventilation syndrome (HCC)    Orthostatic hypotension    OSA on CPAP    8 cm H2O   OSTEOARTHRITIS    Paroxysmal atrial fibrillation ([place]) 2003    (? chronic?) Off anticoag for a while due to falls.  Then apixaban started 12/2014.   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 Ella Bodo): neuropathic pain->diff to treat, failed nucynta, failed spinal stimul trial, oxycontin hs  + tramadol + gabap as of 12/2017 f/u Dr. Letta Pate.   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   Sick sinus syndrome (HCC)    PPM placed   Thrombocytopenia ([place]) 2018   HSM on 2007 abd u/s---suspect some mild splenic sequestration chronically.   Unspecified glaucoma(365.9)    Unspecified hereditary and idiopathic peripheral neuropathy approx age 82   bilat LE's, ? left arm, too.  Feet became progressively numb + left foot pain intermittently.  Pt may be trying a spinal stimulator (as of 05/2015)   Vaccine counseling 11/16/2021   VENOUS INSUFFICIENCY    Being followed by Dr. Sharol Given as of 10/2016 for two R LL venous stasis ulcers/skin tears.  Healed as of 10/30/16 f/u with Dr. Sharol Given.   Venous stasis dermatitis 12/27/2021   VENTRAL HERNIA     Past Surgical History:  Procedure Laterality Date   AMPUTATION Left 04/11/2013   Procedure: AMPUTATION DIGIT Left 3rd toe;  Surgeon: [name], MD;  Location: [place];  Service: Orthopedics;  Laterality: Left;  Left 3rd toe amputation at [facility]   AMPUTATION Left 08/29/2019   Procedure: LEFT TRANSMETATARSAL AMPUTATION;  Surgeon: [name], MD;  Location: [place];  Service: Orthopedics;  Laterality: Left;   BIOPSY  05/04/2020   Procedure: BIOPSY;  Surgeon: Jackquline Denmark, MD;   Location: [facility] ENDOSCOPY;  Service: Endoscopy;;   [place]; 03/10/16   1997 Non-obstructive disease.  03/2016 BMS to RCA, with 25% pDiag dz, o/w normal cors per cath 03/07/16.  Cath 11/01/16: in stent restenosis, successful baloon angioplasty. 08/2020 branch vessel dz, cont medical therapy rec'd.   CARDIAC CATHETERIZATION  12/24/2012   mild < 20% LCx, prox 30% RCA; LVEF 55-65% , moderate pulmonary HTN, moderate AS   CARDIAC DEFIBRILLATOR PLACEMENT  11/07/2017   [facility] MRI Quad CRT defibrillator   CARDIOVASCULAR STRESS TEST  05/11/16 ([facility])   2017 Myocardial perfusion imaging:  No ischemia; scar in apex, global hypokinesis, EF 36%.  06/15/20->mod primarily fixed inferolat wall defect mildly worse with stress c/w infarct/scar with mild peri-infarct ischemia, normal EF-->for cath per cards.   Carotid dopplers  03/09/2016   [facility]: no hemodynamically significant stenosis on either side.   CHOLECYSTECTOMY     COLONOSCOPY [name] 10/30/2012   Procedure: COLONOSCOPY;  Surgeon: Ofilia Neas  Carlean Purl, MD;  Location: [facility];  Service: Endoscopy;  Laterality: [name];   CORONARY ANGIOPLASTY WITH STENT PLACEMENT  03/2016; 04/2017   2017-[facility]: BMS to RCA-pt was placed on Brilinta.  04/2017: DES to RCA.   DORSAL SLIT [name] 10/29/2019   for severe phimosis. Procedure: DORSAL SLIT;  Surgeon: Alexis Frock, MD;  Location: [facility];  Service: Urology;  Laterality: [name];  [place]  05/04/2020   Procedure: ESOPHAGEAL DILATION;  Surgeon: Jackquline Denmark, MD;  Location: [facility] ENDOSCOPY;  Service: Endoscopy;;   ESOPHAGOGASTRODUODENOSCOPY (EGD) WITH PROPOFOL [name] 05/04/2020   Procedure: ESOPHAGOGASTRODUODENOSCOPY (EGD) WITH PROPOFOL;  Surgeon: Jackquline Denmark, MD;  Location: [facility] ENDOSCOPY;  Service: Endoscopy;  Laterality: [name];   EYE SURGERY Bilateral cataract   HEMORRHOID SURGERY     INTRAOCULAR LENS INSERTION Bilateral    KNEE SURGERY Right    LEFT AND RIGHT HEART CATHETERIZATION WITH CORONARY  ANGIOGRAM [name] 12/24/2012   Procedure: LEFT AND RIGHT HEART CATHETERIZATION WITH CORONARY ANGIOGRAM;  Surgeon: [name], MD;  Location: [facility] C[facility] LAB;  Service: Cardiovascular;  Laterality: [name];   LEG SURGERY Bilateral    lenghtening    PACEMAKER PLACEMENT  04/13/2016   2nd deg HB after TAVR, pt had DC MDT PPM placed.   SHOULDER ARTHROSCOPY  08/30/2011   Procedure: ARTHROSCOPY SHOULDER;  Surgeon: [name], MD;  Location: [place];  Service: Orthopedics;  Laterality: Right;  Right Shoulder Arthroscopy, Debridement, and Decompression   SPINAL CORD STIMULATOR INSERTION [name] 09/10/2015   Procedure: LUMBAR SPINAL CORD STIMULATOR INSERTION;  Surgeon: [name], MD;  Location: [place] [facility]URO ORS;  Service: Neurosurgery;  Laterality: [name];   TEE WITHOUT CARDIOVERSION [name] 09/16/2019   Procedure: TRANSESOPHAGEAL ECHOCARDIOGRAM (TEE);  Surgeon: Skeet Latch, MD;  Location: [facility] ENDOSCOPY;  Service: Cardiovascular;  Laterality: [name];   TEE WITHOUT CARDIOVERSION [name] 12/02/2019   +vegetation on AVR and pacer lead in RV.  EF 55-60%, normal wall motion.  Valves function normal.  Procedure: TRANSESOPHAGEAL ECHOCARDIOGRAM (TEE);  Surgeon: Geralynn Rile, MD;  Location: [place];  Service: Cardiovascular;  Laterality: [name];   TOE AMPUTATION Left    due to osteomyelitis.  R big toe surg due to osteoarth   TONSILLECTOMY     traeculectomy Left    eye   TRANSCATHETER AORTIC VALVE REPLACEMENT, TRANSFEMORAL  04/11/2016   TRANSESOPHAGEAL ECHOCARDIOGRAM  03/09/2016; 09/2019   [facility]: EF 55-60%, PFO seen with bi-directional shunting, no thrombus in appendage.  09/2019 ->no valvular vegetations. Small patent foramen ovale with predominantly left to right shunting across the interatrial septum.   TRANSTHORACIC ECHOCARDIOGRAM  01/2015; 01/2016; 05/18/16; 09/18/16, 05/2017, 08/2017   01/2015 No signif change in aortic stenosis (moderate).  01/2016 Severe LVH w/small LV cavity, EF 60-65%, grade I diast dysfxn.  05/2016  (s/p TAVR): EF 50-55%, grd I DD, biopros AV good.  08/2016--EF 50-55%, LV septal motion c/w conduction abnl, grd I DD,mild MS,bioprosth aortic valve well seated, w/trace AR. 05/2017 TTE EF 35%. 08/2017-EF 35%, mod diff hypokin LV, grd I DD, biopros AV good.    TRANSTHORACIC ECHOCARDIOGRAM  04/2018; 09/2019   04/2018: EF 40-45%, mod diffuse LV hypokin, grd I DD, bioprosth AV well seated, no AS or AR. 09/2019 EF 60-65%, grd I DD, valves fine, including bioprosth AV. 04/29/20 (tech diff) EF 55-60%, grd I DD, vegetation on MV.  07/2021 EF 55-60% (s/p upgrade to BiV PPM), AV well seated.   VITRECTOMY      Outpatient Medications Prior to Visit  Medication Sig  Dispense Refill   allopurinol (ZYLOPRIM) 300 MG tablet Take 1 tablet (300 mg total) by mouth daily. with food 90 tablet 1   amoxicillin-clavulanate (AUGMENTIN) 875-125 MG tablet Take 1 tablet by mouth 2 (two) times daily. 20 tablet 0   apixaban (ELIQUIS) 5 MG TABS tablet Take 5 mg by mouth 2 (two) times daily.     ASPIRIN LOW DOSE 81 MG EC tablet Take 81 mg by mouth daily.     azithromycin (ZITHROMAX) 250 MG tablet 2 tabs po qd x 1d, then 1 tab po qd x 4d 6 tablet 0   B Complex-C (B-COMPLEX WITH VITAMIN C) tablet Take 1 tablet by mouth daily. Take 1 tablet daily, 282m     ezetimibe (ZETIA) 10 MG tablet Take 1 tablet (10 mg total) by mouth daily. 30 tablet 1   finasteride (PROSCAR) 5 MG tablet TAKE 1 TABLET BY MOUTH EVERY DAY 90 tablet 0   gabapentin (NEURONTIN) 800 MG tablet TAKE 1 TABLET BY MOUTH 4 TIMES DAILY. 360 tablet 1   Loratadine (CLARITIN PO) Take 1 tablet by mouth in the morning and at bedtime.     metoprolol succinate (TOPROL-XL) 25 MG 24 hr tablet Take 12.5 mg by mouth daily.     Multiple Vitamin (MULTIVITAMIN ADULT PO) Take 1 tablet by mouth daily.     niacin (NIASPAN) 1000 MG CR tablet TAKE 1 TABLET BY MOUTH TWICE A DAY 180 tablet 0   nitroGLYCERIN (NITROSTAT) 0.4 MG SL tablet Place 0.4 mg under the tongue every 5 (five) minutes as  needed for chest pain.     Oxycodone HCl 20 MG TABS 1 tab po bid prn severe pain 60 tablet 0   Oxymetazoline HCl (NASAL SPRAY NA) Place into the nose.     pantoprazole (PROTONIX) 40 MG tablet Take 1 tablet (40 mg total) by mouth daily. 90 tablet 1   potassium chloride SA (KLOR-CON M20) 20 MEQ tablet Take 1 tablet (20 mEq total) by mouth daily. 90 tablet 1   predniSONE (DELTASONE) 20 MG tablet 3 tabs po qd x 2d, then 2 tabs po qd x 4d, then 1 tab po qd x 4d 18 tablet 0   Probiotic Product (ALIGN PO) Take by mouth daily.     rosuvastatin (CRESTOR) 40 MG tablet take 1 tablet by mouth once daily (Patient taking differently: Take 40 mg by mouth daily.) 15 tablet 0   sacubitril-valsartan (ENTRESTO) 24-26 MG Take 1 tablet by mouth 2 (two) times daily.      vitamin C (ASCORBIC ACID) 250 MG tablet Take 250 mg by mouth daily.     VITAMIN D, CHOLECALCIFEROL, PO Take 1 tablet by mouth daily.      WIXELA INHUB 250-50 MCG/ACT AEPB INHALE 1 PUFF BY MOUTH TWICE A DAY 60 each 5   Mometasone Furo-Formoterol Fum (DULERA IN) Inhale into the lungs.     albuterol (PROVENTIL HFA;VENTOLIN HFA) 108 (90 BASE) MCG/ACT inhaler Inhale 2 puffs into the lungs 4 (four) times daily as needed for wheezing or shortness of breath. (Patient not taking: Reported on 06/20/2022)     amoxicillin (AMOXIL) 500 MG tablet Take 2 tablets (1,000 mg total) by mouth 2 (two) times daily. Resume taking 6/21 PM after linezolid course completed (Patient not taking: Reported on 06/20/2022) 120 tablet 11   No facility-administered medications prior to visit.    Allergies  Allergen Reactions   Brimonidine Tartrate Shortness Of Breath    Alphagan-Shortness of breath   Brinzolamide Shortness Of Breath  AZOPT- Shortness of breath   Latanoprost Shortness Of Breath    XALATAN- Shortness of breath   Nucynta [Tapentadol] Shortness Of Breath   Sulfa Antibiotics Palpitations   Timolol Maleate Shortness Of Breath and Other (See Comments)     TIMOPTIC- Aggravated asthma   Diltiazem Swelling     leg swelling   Rofecoxib Swelling     VIOXX- leg swelling   Vancomycin Rash and Other (See Comments)    Blisters   Codeine Other (See Comments)    Childhood reaction   Tamsulosin Other (See Comments)    Dizziness    Celecoxib Other (See Comments)    CELLBREX-confusion   Colchicine Diarrhea    diarrhea   Tape Rash    ROS As per HPI  PE:    06/20/2022   11:07 AM 06/15/2022    1:08 PM 05/22/2022    4:32 PM  Vitals with BMI  Height 6' 4"  6' 4"  6' 4"   Weight 302 lbs 3 oz  304 lbs 6 oz  BMI 61.4  43.15  Systolic 400 867 619  Diastolic 71 74 76  Pulse 509 71 73     Physical Exam  Gen: Alert, well appearing.  Patient is oriented to person, place, time, and situation. AFFECT: pleasant, lucid thought and speech. CV: RRR, no m/r/g.   LUNGS: diffuse exp wheezing and prolonged exp phase, mild dec aeration diffusely with exhalation.  Subtle R base crackles.  LABS:  Last metabolic panel Lab Results  Component Value Date   GLUCOSE 130 (H) 02/10/2022   NA 141 02/10/2022   K 4.1 02/10/2022   CL 107 02/10/2022   CO2 27 02/10/2022   BUN 12 02/10/2022   CREATININE 0.67 (L) 02/10/2022   GFRNONAA >60 12/15/2021   CALCIUM 9.3 02/10/2022   PROT 6.1 02/10/2022   ALBUMIN 2.8 (L) 12/15/2021   BILITOT 0.6 02/10/2022   ALKPHOS 49 12/15/2021   AST 24 02/10/2022   ALT 17 02/10/2022   ANIONGAP 10 12/15/2021   IMPRESSION AND PLAN:  Acute COPD exacerbation with acute bronchopneumonia. Improving some but still significant wheezing and airflow limitation. Depo-Medrol 80 mg IM given today, and he will finish the prednisone taper that I started him on 5 days ago.   He finishes his azithromycin today and will continue his Augmentin as prescribed. He will continue Robitussin DM every 6 hours as needed. I recommended albuterol MDI 2 puffs every 6 hours as needed--new prescription sent today. I will take Dulera off of his medication  list. His chronic/maintenance inhaler is Wixela.  Prevnar 20 today.  An After Visit Summary was printed and given to the patient.  FOLLOW UP: Return if symptoms worsen or fail to improve.  Signed:  Crissie Sickles, MD           06/20/2022

## 2022-06-28 NOTE — Progress Notes (Signed)
This encounter was created in error - please disregard.

## 2022-07-08 ENCOUNTER — Other Ambulatory Visit: Payer: Self-pay | Admitting: Family Medicine

## 2022-07-10 ENCOUNTER — Telehealth: Payer: Self-pay | Admitting: Family Medicine

## 2022-07-10 ENCOUNTER — Other Ambulatory Visit: Payer: Self-pay

## 2022-07-10 ENCOUNTER — Other Ambulatory Visit (HOSPITAL_COMMUNITY): Payer: Self-pay

## 2022-07-10 MED ORDER — IPRATROPIUM BROMIDE 0.03 % NA SOLN: NASAL | 2 refills | Status: DC

## 2022-07-10 MED ORDER — AMOXICILLIN 500 MG PO CAPS: 1000.0000 mg | ORAL_CAPSULE | Freq: Two times a day (BID) | ORAL | 0 refills | Status: DC

## 2022-07-10 NOTE — Progress Notes (Signed)
Patient stating he is having pharmacy issues with amoxicillin '500mg'$  prescription.   Per pharmacy the Rx was canceled by another providers office on 06/20/2022.  Dr. Tommy Medal manages this medication and has   Sending in a new rx of amoxicillin. Take 2 tablets (1,000 mg total) by mouth 2 (two) times daily for long suppressive therapy.  [name], LPN

## 2022-07-10 NOTE — Telephone Encounter (Signed)
Pt is needing refill on nasal spray Ipratropium

## 2022-07-10 NOTE — Telephone Encounter (Signed)
Pt advised refill sent. °

## 2022-07-11 ENCOUNTER — Other Ambulatory Visit: Payer: Self-pay | Admitting: Family Medicine

## 2022-07-28 ENCOUNTER — Other Ambulatory Visit: Payer: Self-pay | Admitting: Family Medicine

## 2022-07-31 ENCOUNTER — Other Ambulatory Visit: Payer: Self-pay | Admitting: Family Medicine

## 2022-08-03 ENCOUNTER — Telehealth: Payer: Self-pay

## 2022-08-03 NOTE — Telephone Encounter (Signed)
Spoke with pt to schedule AWV in office. Patient declined to schedule wellness visit at this time.   

## 2022-08-04 ENCOUNTER — Other Ambulatory Visit: Payer: Self-pay | Admitting: Infectious Disease

## 2022-08-07 NOTE — Telephone Encounter (Signed)
Ok to refill 

## 2022-08-07 NOTE — Telephone Encounter (Signed)
Ok sent a year supply today, his last prescription did not have any refills.

## 2022-08-14 ENCOUNTER — Ambulatory Visit: Payer: Medicare Other | Admitting: Orthopedic Surgery

## 2022-08-14 ENCOUNTER — Ambulatory Visit: Payer: Medicare Other | Admitting: Family Medicine

## 2022-08-21 ENCOUNTER — Ambulatory Visit: Payer: Medicare Other | Admitting: Family Medicine

## 2022-08-28 ENCOUNTER — Ambulatory Visit: Payer: Medicare Other | Admitting: Orthopedic Surgery

## 2022-09-01 ENCOUNTER — Encounter: Payer: Self-pay | Admitting: Family Medicine

## 2022-09-01 ENCOUNTER — Ambulatory Visit: Payer: Medicare Other | Admitting: Family Medicine

## 2022-09-01 ENCOUNTER — Other Ambulatory Visit: Payer: Self-pay | Admitting: Family Medicine

## 2022-09-01 VITALS — BP 117/72 | HR 92 | Temp 97.6°F | Ht 76.0 in | Wt 297.0 lb

## 2022-09-01 DIAGNOSIS — E1142 Type 2 diabetes mellitus with diabetic polyneuropathy: Secondary | ICD-10-CM

## 2022-09-01 LAB — POCT GLYCOSYLATED HEMOGLOBIN (HGB A1C)
HbA1c POC (<> result, manual entry): 6 % (ref 4.0–5.6)
HbA1c, POC (controlled diabetic range): 6 % (ref 0.0–7.0)
HbA1c, POC (prediabetic range): 6 % (ref 5.7–6.4)
Hemoglobin A1C: 6 % — AB (ref 4.0–5.6)

## 2022-09-01 MED ORDER — PANTOPRAZOLE SODIUM 40 MG PO TBEC: 40.0000 mg | DELAYED_RELEASE_TABLET | Freq: Every day | ORAL | 1 refills | Status: DC

## 2022-09-01 MED ORDER — PROMETHAZINE HCL 12.5 MG PO TABS: 12.5000 mg | ORAL_TABLET | Freq: Three times a day (TID) | ORAL | 1 refills | Status: DC | PRN

## 2022-09-01 NOTE — Progress Notes (Unsigned)
OFFICE VISIT  09/03/2022  CC:  Chief Complaint  Patient presents with   Medical Management of Chronic Issues    Patient is a 82 y.o. male who presents accompanied by his wife for 3 month follow-up diabetes, CHF, chronic bilateral lower extremity edema.  INTERIM HX:  GENERAL CARDIAC HX:  coronary artery disease with history of multiple PCI's, history of ischemic cardiomyopathy which responded well to biventricular ICD, paroxysmal A-fib, history of NSVT, history of TAVR, and history of endocarditis of prosthetic valve and pacer (on chronic amoxil suppressive therapy).   [facility] ED visit 08/15/2022, was fatigued, BP soft.  Extensive evaluation failed to reveal any acute abnormality (EKG, cardiac enzymes, chest x-ray, CT angio chest, CT head, CT abdomen and pelvis, c-Met, CBC, and lipase). He did have mild Elev AST and ALT. He followed up at his cardiologist on 08/21/2022--> plan was to update his echo to assess LV function.  Due to hypotension on Entresto they held off on restart of that medication.  They recommend he start torsemide 10 mg twice a day, hold evening dose if hypotensive. His statin was held due to mildly elevated transaminases in the emergency department.  Repeat of these that day showed them normalized. LABS at cardiologist that day: Gluc 158, Na 143, K 4.2, sCr 0.63, AST 49, ALT 37.  CURRENTLY: Still signif prob with dizziness, describes sometimes onset 30-60 sec after standing up. Home bp's consistently low. He has not taken torsemide (not on metoprolol anymore either).  No palpitations or CP.  Says he's at baseline DOE.  No SOB at rest. He has chronic/unchanged forearms and legs edema.  Has signif substernal burning and indigestion despite pantoprazole 40 qd, states he had good control when on this bid.   PMP AWARE reviewed today: most recent rx for oxycodone was filled 02/10/2022, # 21, rx by me. Most recent gabapentin prescription was  filled 06/16/2022, #360, prescription by me. No red flags.  ROS as above, plus--> no fevers,  no SOB, no wheezing, no cough, no HAs, no rashes, no melena/hematochezia.  No polyuria or polydipsia.  No myalgias or arthralgias.  No focal weakness, paresthesias, or tremors.  No acute vision or hearing abnormalities.  No dysuria or unusual/new urinary urgency or frequency.  No n/v/d or abd pain.    Past Medical History:  Diagnosis Date   AICD (automatic cardioverter/defibrillator) present    Balanitis    +severe phimosis+ buried penis->circ not possible but dorsal slit done 10/2019   BPH (benign prostatic hypertrophy)    with urinary retention.  Renal u/s 12/04/19 NORMAL KIDNEYS, NORMAL BLADDER, NO HYDRONEPHROSIS   CAD (coronary artery disease)    Nonobstructive by 12/2012 cath; then 03/2016 he required BMS to RCA ([facility]).  In-stent restenosis on cath 11/01/16, baloon angioplasty successful.   CATARACT, HX OF    Chronic combined systolic and diastolic CHF (congestive heart failure) ([place]) 05/2016   Ischemic CM.  04/2018 EF 40-45%, grd I DD.  07/2021 EF 55-60%, AV well seated   Chronic pain syndrome    Lumbar DDD; chronic neuropathic pain (DM); has spinal stimulator and sees pain mgmt MD   Complicated UTI (urinary tract infection) 09/2019   Phimoses, acute urinary retention, entoerococcus UTI, enterococc bacteremia.  F/u blood clx's neg x 5d.     COPD    Debilitated patient    Diabetes mellitus type 2 with complications ([place])    0000000 jumped from 5.7% to 6.1% 03/2015---started metformin at that time.  DM  2 dx by fasting gluc criteria 2018.  Has chronic neuropath pain   Enterococcal bacteremia 09/2019   Phimoses, acute urinary retention, entoerococcus UTI, enterococc bacteremia.  F/u blood clx's neg x 5d.     Essential hypertension, benign    Fatty liver 2007   2007 u/s showed fatty liver with hepatosplenomegaly.  2019 repeat u/s->fatty liver but no cirrhosis or hepatosplenomegaly.   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 Tommy Medal)   Lumbosacral neuritis    Lumbosacral spondylosis    Lumbar spinal stenosis with neurogenic claudication--contributes to his chronic pain syndrome   Morbid obesity ([place])    Normal memory function 08/2014   Neuropsychological testing ([facility]): no cognitive impairment or sign of neurodegenerative disorder.  Likely has adjustment d/o with mixed anxiety/depressed features and may benefit from low dose SNRI.     Normocytic anemia 03/2016   Mild-pt needs ferritin and vit B12 level checked (as of 03/22/16). Hb stable 09/2019.  Hemoccult x 3 NEG 03/2022   NSTEMI (non-ST elevated myocardial infarction) ([place]) 03/20/2016   BMS to RCA   Obesity hypoventilation syndrome (HCC)    Orthostatic hypotension    OSA on CPAP    8 cm H2O   OSTEOARTHRITIS    Paroxysmal atrial fibrillation ([place]) 2003    (? chronic?) Off anticoag for a while due to falls.  Then apixaban started 12/2014.   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 Ella Bodo): neuropathic pain->diff to treat, failed nucynta, failed spinal stimul trial, oxycontin hs + tramadol + gabap as of 12/2017 f/u Dr. Letta Pate.   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   Sick sinus syndrome (HCC)    PPM placed   Thrombocytopenia ([place]) 2018   HSM on 2007 abd u/s---suspect some mild splenic sequestration chronically.   Unspecified glaucoma(365.9)    Unspecified hereditary and idiopathic peripheral neuropathy approx age 30   bilat LE's, ? left arm, too.  Feet became progressively numb + left foot pain intermittently.  Pt may be trying a spinal stimulator (as of 05/2015)   Vaccine counseling 11/16/2021   VENOUS INSUFFICIENCY    Being followed by Dr. Sharol Given as of 10/2016 for two R LL venous stasis ulcers/skin tears.  Healed as of 10/30/16 f/u with Dr. Sharol Given.   Venous stasis dermatitis 12/27/2021   VENTRAL HERNIA     Past Surgical History:  Procedure  Laterality Date   AMPUTATION Left 04/11/2013   Procedure: AMPUTATION DIGIT Left 3rd toe;  Surgeon: [name], MD;  Location: [place];  Service: Orthopedics;  Laterality: Left;  Left 3rd toe amputation at [facility]   AMPUTATION Left 08/29/2019   Procedure: LEFT TRANSMETATARSAL AMPUTATION;  Surgeon: [name], MD;  Location: [place];  Service: Orthopedics;  Laterality: Left;   BIOPSY  05/04/2020   Procedure: BIOPSY;  Surgeon: Jackquline Denmark, MD;  Location: [name] ENDOSCOPY;  Service: Endoscopy;;   [place]; 03/10/16   1997 Non-obstructive disease.  03/2016 BMS to RCA, with 25% pDiag dz, o/w normal cors per cath 03/07/16.  Cath 11/01/16: in stent restenosis, successful baloon angioplasty. 08/2020 branch vessel dz, cont medical therapy rec'd.   CARDIAC CATHETERIZATION  12/24/2012   mild < 20% LCx, prox 30% RCA; LVEF 55-65% , moderate pulmonary HTN, moderate AS   CARDIAC  DEFIBRILLATOR PLACEMENT  11/07/2017   [place] MRI Quad CRT defibrillator   CARDIOVASCULAR STRESS TEST  05/11/16 ([facility])   2017 Myocardial perfusion imaging:  No ischemia; scar in apex, global hypokinesis, EF 36%.  06/15/20->mod primarily fixed inferolat wall defect mildly worse with stress c/w infarct/scar with mild peri-infarct ischemia, normal EF-->for cath per cards.   Carotid dopplers  03/09/2016   [facility]: no hemodynamically significant stenosis on either side.   CHOLECYSTECTOMY     COLONOSCOPY [name] 10/30/2012   Procedure: COLONOSCOPY;  Surgeon: [name], MD;  Location: [facility] ENDOSCOPY;  Service: Endoscopy;  Laterality: [name];   CORONARY ANGIOPLASTY WITH STENT PLACEMENT  03/2016; 04/2017   2017-[facility]: BMS to RCA-pt was placed on Brilinta.  04/2017: DES to RCA.   DORSAL SLIT [name] 10/29/2019   for severe phimosis. Procedure: DORSAL SLIT;  Surgeon: Alexis Frock, MD;  Location: [facility] ORS;  Service: Urology;  Laterality: [name];  [place]  05/04/2020   Procedure: ESOPHAGEAL DILATION;  Surgeon: Jackquline Denmark, MD;  Location: [facility] ENDOSCOPY;  Service: Endoscopy;;   ESOPHAGOGASTRODUODENOSCOPY (EGD) WITH PROPOFOL [name] 05/04/2020   Procedure: ESOPHAGOGASTRODUODENOSCOPY (EGD) WITH PROPOFOL;  Surgeon: Jackquline Denmark, MD;  Location: [facility] ENDOSCOPY;  Service: Endoscopy;  Laterality: [name];   EYE SURGERY Bilateral cataract   HEMORRHOID SURGERY     INTRAOCULAR LENS INSERTION Bilateral    KNEE SURGERY Right    LEFT AND RIGHT HEART CATHETERIZATION WITH CORONARY ANGIOGRAM [name] 12/24/2012   Procedure: LEFT AND RIGHT HEART CATHETERIZATION WITH CORONARY ANGIOGRAM;  Surgeon: [name], MD;  Location: [facility] CATH LAB;  Service: Cardiovascular;  Laterality: [name];   LEG SURGERY Bilateral    lenghtening    PACEMAKER PLACEMENT  04/13/2016   2nd deg HB after TAVR, pt had DC MDT PPM placed.   SHOULDER ARTHROSCOPY  08/30/2011   Procedure: ARTHROSCOPY SHOULDER;  Surgeon: [name], MD;  Location: [place];   Service: Orthopedics;  Laterality: Right;  Right Shoulder Arthroscopy, Debridement, and Decompression   SPINAL CORD STIMULATOR INSERTION [name] 09/10/2015   Procedure: LUMBAR SPINAL CORD STIMULATOR INSERTION;  Surgeon: [name], MD;  Location: [place] [facility]URO ORS;  Service: Neurosurgery;  Laterality: [name];   TEE WITHOUT CARDIOVERSION [name] 09/16/2019   Procedure: TRANSESOPHAGEAL ECHOCARDIOGRAM (TEE);  Surgeon: Skeet Latch, MD;  Location: [facility] END[facility]COPY;  Service: Cardiovascular;  Laterality: [name];   TEE WITHOUT CARDIOVERSION [name] 12/02/2019   +vegetation on AVR and pacer lead in RV.  EF 55-60%, normal wall motion.  Valves function normal.  Procedure: TRANSESOPHAGEAL ECHOCARDIOGRAM (TEE);  Surgeon: Geralynn Rile, MD;  Location: [facility]  ENDOSCOPY;  Service: Cardiovascular;  Laterality: [name];   TOE AMPUTATION Left    due to osteomyelitis.  R big toe surg due to osteoarth   TONSILLECTOMY     traeculectomy Left    eye   TRANSCATHETER AORTIC VALVE REPLACEMENT, TRANSFEMORAL  04/11/2016   TRANSESOPHAGEAL ECHOCARDIOGRAM  03/09/2016; 09/2019   [facility]: EF 55-60%, PFO seen with bi-directional shunting, no thrombus in appendage.  09/2019 ->no valvular vegetations. Small patent foramen ovale with predominantly left to right shunting across the interatrial septum.   TRANSTHORACIC ECHOCARDIOGRAM  01/2015; 01/2016; 05/18/16; 09/18/16, 05/2017, 08/2017   01/2015 No signif change in aortic stenosis (moderate).  01/2016 Severe LVH w/small LV cavity, EF 60-65%, grade I diast dysfxn.  05/2016 (s/p TAVR): EF 50-55%, grd I DD, biopros AV good.  08/2016--EF 50-55%, LV septal motion c/w conduction abnl, grd I DD,mild MS,bioprosth aortic valve well seated, w/trace AR. 05/2017 TTE EF 35%. 08/2017-EF 35%, mod diff hypokin LV, grd I DD, biopros AV good.    TRANSTHORACIC ECHOCARDIOGRAM  04/2018; 09/2019   04/2018: EF 40-45%, mod diffuse LV hypokin, grd I DD, bioprosth AV well seated, no AS or AR. 09/2019 EF 60-65%, grd I DD, valves fine,  including bioprosth AV. 04/29/20 (tech diff) EF 55-60%, grd I DD, vegetation on MV.  07/2021 EF 55-60% (s/p upgrade to BiV PPM), AV well seated.   VITRECTOMY      Outpatient Medications Prior to Visit  Medication Sig Dispense Refill   albuterol (VENTOLIN HFA) 108 (90 Base) MCG/ACT inhaler Inhale 2 puffs into the lungs every 6 (six) hours as needed for wheezing or shortness of breath. 18 g 0   allopurinol (ZYLOPRIM) 300 MG tablet Take 1 tablet (300 mg total) by mouth daily. with food 90 tablet 1   amoxicillin (AMOXIL) 500 MG capsule TAKE 2 CAPSULES BY MOUTH TWICE A DAY 120 capsule 11   apixaban (ELIQUIS) 5 MG TABS tablet Take 5 mg by mouth 2 (two) times daily.     ASPIRIN LOW DOSE 81 MG EC tablet Take 81 mg by mouth daily.     B Complex-C (B-COMPLEX WITH VITAMIN C) tablet Take 1 tablet by mouth daily. Take 1 tablet daily, '250mg'$      ezetimibe (ZETIA) 10 MG tablet Take 1 tablet (10 mg total) by mouth daily. 30 tablet 1   finasteride (PROSCAR) 5 MG tablet TAKE 1 TABLET BY MOUTH EVERY DAY 90 tablet 0   gabapentin (NEURONTIN) 800 MG tablet TAKE 1 TABLET BY MOUTH FOUR TIMES A DAY 120 tablet 0   ipratropium (ATROVENT) 0.03 % nasal spray 2 SPRAY EACH NOSTRIL EVERY 12 HOURS FOR RUNNY NOSE 30 mL 2   Loratadine (CLARITIN PO) Take 1 tablet by mouth in the morning and at bedtime.     Multiple Vitamin (MULTIVITAMIN ADULT PO) Take 1 tablet by mouth daily.     niacin (NIASPAN) 1000 MG CR tablet TAKE 1 TABLET BY MOUTH TWICE A DAY 60 tablet 0   Oxycodone HCl 20 MG TABS 1 tab po bid prn severe pain 60 tablet 0   Oxymetazoline HCl (NASAL SPRAY NA) Place into the nose.     potassium chloride SA (KLOR-CON M20) 20 MEQ tablet Take 1 tablet (20 mEq total) by mouth daily. 90 tablet 1   Probiotic Product (ALIGN PO) Take by mouth daily.     vitamin C (ASCORBIC ACID) 250 MG tablet Take 250 mg by mouth daily.     VITAMIN D, CHOLECALCIFEROL, PO Take 1 tablet by mouth daily.  WIXELA INHUB 250-50 MCG/ACT AEPB INHALE 1  PUFF BY MOUTH TWICE A DAY 60 each 5   metoprolol succinate (TOPROL-XL) 25 MG 24 hr tablet Take 12.5 mg by mouth daily.     pantoprazole (PROTONIX) 40 MG tablet Take 1 tablet (40 mg total) by mouth daily. 90 tablet 1   nitroGLYCERIN (NITROSTAT) 0.4 MG SL tablet Place 0.4 mg under the tongue every 5 (five) minutes as needed for chest pain. (Patient not taking: Reported on 09/01/2022)     amoxicillin-clavulanate (AUGMENTIN) 875-125 MG tablet Take 1 tablet by mouth 2 (two) times daily. 20 tablet 0   azithromycin (ZITHROMAX) 250 MG tablet 2 tabs po qd x 1d, then 1 tab po qd x 4d 6 tablet 0   predniSONE (DELTASONE) 20 MG tablet 3 tabs po qd x 2d, then 2 tabs po qd x 4d, then 1 tab po qd x 4d 18 tablet 0   rosuvastatin (CRESTOR) 40 MG tablet take 1 tablet by mouth once daily (Patient not taking: Reported on 09/01/2022) 15 tablet 0   sacubitril-valsartan (ENTRESTO) 24-26 MG Take 1 tablet by mouth 2 (two) times daily.  (Patient not taking: Reported on 09/01/2022)     No facility-administered medications prior to visit.    Allergies  Allergen Reactions   Brimonidine Tartrate Shortness Of Breath    Alphagan-Shortness of breath   Brinzolamide Shortness Of Breath    AZOPT- Shortness of breath   Latanoprost Shortness Of Breath    XALATAN- Shortness of breath   Nucynta [Tapentadol] Shortness Of Breath   Sulfa Antibiotics Palpitations   Timolol Maleate Shortness Of Breath and Other (See Comments)    TIMOPTIC- Aggravated asthma   Diltiazem Swelling     leg swelling   Rofecoxib Swelling     VIOXX- leg swelling   Vancomycin Rash and Other (See Comments)    Blisters   Codeine Other (See Comments)    Childhood reaction   Tamsulosin Other (See Comments)    Dizziness    Celecoxib Other (See Comments)    CELLBREX-confusion   Colchicine Diarrhea    diarrhea   Tape Rash    Review of Systems As per HPI  PE:    09/01/2022    3:50 PM 06/20/2022   11:07 AM 06/15/2022    1:08 PM  Vitals with BMI  Height  '6\' 4"'$  '6\' 4"'$  '6\' 4"'$   Weight 297 lbs 302 lbs 3 oz   BMI 0000000 [id number]   Systolic [id number]  Diastolic 72 71 74  Pulse 92 110 71  02 sat 100% RA  Physical Exam  Gen: Alert, well appearing.  Patient is oriented to person, place, time, and situation. CV: RRR, no m/r/g.   LUNGS: CTA bilat, nonlabored resps, good aeration in all lung fields. EXT: 1+ pitting bilat LLs, no pitting in arms  LABS:  Last CBC Lab Results  Component Value Date   WBC 5.1 02/10/2022   HGB 11.3 (L) 02/10/2022   HCT 33.7 (L) 02/10/2022   MCV 92.1 02/10/2022   MCH 30.9 02/10/2022   RDW 14.3 02/10/2022   PLT 124 (L) 02/10/2022   Lab Results  Component Value Date   CHOL 115 02/10/2022   HDL 58 02/10/2022   LDLCALC 43 02/10/2022   TRIG 65 02/10/2022   CHOLHDL 2.0 [id number]   Last metabolic panel Lab Results  Component Value Date   GLUCOSE 130 (H) 02/10/2022   NA 141 02/10/2022   K 4.1 02/10/2022   CL 107 02/10/2022  CO2 27 02/10/2022   BUN 12 02/10/2022   CREATININE 0.67 (L) 02/10/2022   GFRNONAA >60 12/15/2021   CALCIUM 9.3 02/10/2022   PROT 6.1 02/10/2022   ALBUMIN 2.8 (L) 12/15/2021   BILITOT 0.6 02/10/2022   ALKPHOS 49 12/15/2021   AST 24 02/10/2022   ALT 17 02/10/2022   ANIONGAP 10 12/15/2021   Last hemoglobin A1c Lab Results  Component Value Date   HGBA1C 6.0 (A) 09/01/2022   HGBA1C 6.0 09/01/2022   HGBA1C 6.0 09/01/2022   HGBA1C 6.0 09/01/2022   Last thyroid functions Lab Results  Component Value Date   TSH 0.905 04/29/2020   T3TOTAL 89 09/16/2018   Last vitamin B12 and Folate Lab Results  Component Value Date   [id number] 05/24/2016   IMPRESSION AND PLAN:  1) DM 2, good control on no glycemic meds. POC Hba1c today is 6.0%  2) Hypotension.  Primarily orthostatic. Off antihypertensives and diuretics. Cardiology has echo and stress test planned for 10/03/22.  3) GERD: not ideal control on pantoprazole 40 qd-->inc to '40mg'$  bid.  Spent 35 min with pt today reviewing  HPI, reviewing relevant past history, doing exam, reviewing and discussing lab and imaging data, and formulating plans.  An After Visit Summary was printed and given to the patient.  FOLLOW UP: Return in about 3 months (around 12/02/2022) for routine chronic illness f/u.  Signed:  Crissie Sickles, MD           09/03/2022

## 2022-09-01 NOTE — Telephone Encounter (Signed)
Pt has appt today, will address refill then.

## 2022-09-02 ENCOUNTER — Other Ambulatory Visit: Payer: Self-pay | Admitting: Family Medicine

## 2022-09-04 NOTE — Telephone Encounter (Signed)
Please fill, if appropriate.

## 2022-09-11 ENCOUNTER — Telehealth: Payer: Self-pay

## 2022-09-11 NOTE — Transitions of Care (Post Inpatient/ED Visit) (Unsigned)
   09/11/2022  Name: Tim [name] MRN: [id number] DOB: May 30, 1941  Today's TOC FU Call Status: Today's TOC FU Call Status:: Unsuccessul Call (1st Attempt) Unsuccessful Call (1st Attempt) Date: 09/11/22  Attempted to reach the patient regarding the most recent Inpatient/ED visit.  Follow Up Plan: Additional outreach attempts will be made to reach the patient to complete the Transitions of Care (Post Inpatient/ED visit) call.   [place] LPN [place] Advisor Direct Dial [phone]

## 2022-09-12 NOTE — Transitions of Care (Post Inpatient/ED Visit) (Signed)
   09/12/2022  Name: MAXIMUM REILAND MRN: [id number] DOB: [birth year]  Today's TOC FU Call Status: Today's TOC FU Call Status:: Successful TOC FU Call Competed Unsuccessful Call (1st Attempt) Date: 09/11/22 [name] FU Call Complete Date: 09/12/22  Transition Care Management Follow-up Telephone Call Date of Discharge: 09/07/22 Discharge Facility: Other ([place]) Name of Other ([facility]) Discharge Facility: [facility] Type of Discharge: (S) Inpatient Admission Primary Inpatient Discharge Diagnosis:: chest pain How have you been since you were released from the hospital?: Better Any questions or concerns?: Yes (pt had 1 episode of chest pain (sharp over left side of chest about the size of palm, and left shoulder blade)  since being home- took nitro and chest pain resolved in 20-30 minutes)  Items Reviewed: Did you receive and understand the discharge instructions provided?: Yes Medications obtained and verified?: Yes (Medications Reviewed) Any new allergies since your discharge?: No Dietary orders reviewed?: Yes Do you have support at home?: Yes  Home Care and Equipment/Supplies: [place] Ordered?: No Any new equipment or medical supplies ordered?: No  Functional Questionnaire: Do you need assistance with bathing/showering or dressing?: No Do you need assistance with meal preparation?: No Do you need assistance with eating?: No Do you have difficulty maintaining continence: No Do you need assistance with getting out of bed/getting out of a chair/moving?: No Do you have difficulty managing or taking your medications?: No  Folllow up appointments reviewed: PCP Follow-up appointment confirmed?: Yes Date of PCP follow-up appointment?: 09/13/22 Follow-up Provider: [facility] Follow-up appointment confirmed?: No Do you need transportation to your follow-up appointment?: No Do you understand care options if your condition(s) worsen?:  Yes-patient verbalized understanding    [place] LPN [place] [phone]

## 2022-09-13 ENCOUNTER — Ambulatory Visit: Payer: Medicare Other | Admitting: Family Medicine

## 2022-09-13 ENCOUNTER — Encounter: Payer: Self-pay | Admitting: Family Medicine

## 2022-09-13 VITALS — BP 129/81 | HR 87 | Temp 97.8°F | Ht 76.0 in | Wt 290.0 lb

## 2022-09-13 DIAGNOSIS — R079 Chest pain, unspecified: Secondary | ICD-10-CM | POA: Diagnosis not present

## 2022-09-13 DIAGNOSIS — Z7901 Long term (current) use of anticoagulants: Secondary | ICD-10-CM

## 2022-09-13 DIAGNOSIS — I255 Ischemic cardiomyopathy: Secondary | ICD-10-CM

## 2022-09-13 DIAGNOSIS — D500 Iron deficiency anemia secondary to blood loss (chronic): Secondary | ICD-10-CM

## 2022-09-13 DIAGNOSIS — I951 Orthostatic hypotension: Secondary | ICD-10-CM

## 2022-09-13 MED ORDER — PREDNISONE 20 MG PO TABS: ORAL_TABLET | ORAL | 0 refills | Status: DC

## 2022-09-13 NOTE — Progress Notes (Signed)
09/13/2022  CC:  Chief Complaint  Patient presents with   Hospitalization Follow-up    Patient is a 82 y.o. Caucasian male who presents accompanied by his wife for hospital follow up, specifically [facility] face-to-face visit. Dates hospitalized: 09/04/2022 to 09/07/2022 Days since d/c from hospital:  6 days Patient was discharged from hospital to home Reason for admission to hospital: Chest pain Date of interactive (phone) contact with patient and/or caregiver: 09/11/2022  I have reviewed patient's discharge summary plus pertinent specific notes, labs, and imaging from the hospitalization.  Patient ruled out for acute coronary syndrome.  Most pertinent Hospital problem at this point is chronic iron deficiency anemia: Hemoglobin was 9 on admission and was 8.4 on the day of discharge.  White blood cell count 4.4, platelet count 110,000.  No anemia workup was done. Day of discharge chemistry panel showed a sodium 141, potassium 3.8, chloride 106, bicarb 25, BUN 14, creatinine 0.61, calcium 8.3, AST 56, ALT 37, alkaline phosphatase 93, albumin 2.9. Chest x-ray showed no acute airspace opacities. Right upper extremity venous Doppler showed no evidence of DVT. Echo on 09/04/2022 showed EF 50 to [id number], mild diastolic impairment, well-seated bioprosthetic aortic valve.  There has been slight interval worsening of the peak velocity and mean gradient when compared to prior study from January 2023. Myocardial perfusion imaging on 09/06/2022: Small mild ischemia in the anterolateral wall.  This corresponds to the patient's known branch vessel obstructive coronary disease in the diagonal.  No large areas of reversible ischemia.  Ejection fraction 28%.  Wt 135.2 Kg on 09/07/22, day of d/c.  CURRENTLY: Feeling a bit better.  Has chronic orthostatic dizziness and generalized fatigue. For the last week since getting out of the hospital he has felt like he had a rattly cough and wheezing with some  breathlessness.  No fever.  Denies any history of melena.  He does say that his stools have been black intermittently lately because he is been taking Pepto for generalized gassiness/indigestion.  No bright red blood in stool or urine.  No abdominal pain. No known history of GI bleed.  He has not been taking torsemide but has this for as needed use.  Medication reconciliation was done today and patient is taking meds as recommended by discharging hospitalist/specialist.   Ferrous gluconate 324 mg tablet twice daily, albuterol inhaler, allopurinol 300 mg daily, amoxicillin 1000 mg twice a day, aspirin 81 mg a day, Eliquis 5 mg twice daily, Zetia 10 mg daily, finasteride 5 mg daily, Wixela and hub 250-51 puff twice daily, gabapentin 800 mg 4 times daily, pantoprazole 40 mg a day, potassium chloride 20 mEq daily, torsemide 20 mg daily as needed.  PMH:  Past Medical History:  Diagnosis Date   AICD (automatic cardioverter/defibrillator) present    Balanitis    +severe phimosis+ buried penis->circ not possible but dorsal slit done 10/2019   BPH (benign prostatic hypertrophy)    with urinary retention.  Renal u/s 12/04/19 NORMAL KIDNEYS, NORMAL BLADDER, NO HYDRONEPHROSIS   CAD (coronary artery disease)    Nonobstructive by 12/2012 cath; then 03/2016 he required BMS to RCA ([facility]).  In-stent restenosis on cath 11/01/16, baloon angioplasty successful.   CATARACT, HX OF    Chronic combined systolic and diastolic CHF (congestive heart failure) ([place]) 05/2016   Ischemic CM.  04/2018 EF 40-45%, grd I DD.  07/2021 EF 55-60%, AV well seated   Chronic pain syndrome    Lumbar DDD; chronic neuropathic pain (DM); has spinal stimulator and  sees pain mgmt MD   Complicated UTI (urinary tract infection) 09/2019   Phimoses, acute urinary retention, entoerococcus UTI, enterococc bacteremia.  F/u blood clx's neg x 5d.     COPD    Debilitated patient    Diabetes mellitus type 2 with complications ([place])    0000000 jumped  from 5.7% to 6.1% 03/2015---started metformin at that time.  DM 2 dx by fasting gluc criteria 2018.  Has chronic neuropath pain   Enterococcal bacteremia 09/2019   Phimoses, acute urinary retention, entoerococcus UTI, enterococc bacteremia.  F/u blood clx's neg x 5d.     Essential hypertension, benign    Fatty liver 2007   2007 u/s showed fatty liver with hepatosplenomegaly.  2019 repeat u/s->fatty liver but no cirrhosis or hepatosplenomegaly.   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 Tommy Medal)   Lumbosacral neuritis    Lumbosacral spondylosis    Lumbar spinal stenosis with neurogenic claudication--contributes to his chronic pain syndrome   Morbid obesity ([place])    Normal memory function 08/2014   Neuropsychological testing ([facility]): no cognitive impairment or sign of neurodegenerative disorder.  Likely has adjustment d/o with mixed anxiety/depressed features and may benefit from low dose SNRI.     Normocytic anemia 03/2016   Mild-pt needs ferritin and vit B12 level checked (as of 03/22/16). Hb stable 09/2019.  Hemoccult x 3 NEG 03/2022   NSTEMI (non-ST elevated myocardial infarction) ([place]) 03/20/2016   BMS to RCA   Obesity hypoventilation syndrome (HCC)    Orthostatic hypotension    OSA on CPAP    8 cm H2O   OSTEOARTHRITIS    Paroxysmal atrial fibrillation ([place]) 2003    (? chronic?) Off anticoag for a while due to falls.  Then apixaban started 12/2014.   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 Ella Bodo): neuropathic pain->diff to treat, failed nucynta, failed spinal stimul  trial, oxycontin hs + tramadol + gabap as of 12/2017 f/u Dr. Letta Pate.   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   Sick sinus syndrome (HCC)    PPM placed   Thrombocytopenia ([place]) 2018   HSM on 2007 abd u/s---suspect some mild splenic sequestration chronically.   Unspecified glaucoma(365.9)    Unspecified hereditary and idiopathic peripheral neuropathy approx age 43   bilat LE's, ? left arm, too.  Feet became progressively numb + left foot pain intermittently.  Pt  may be trying a spinal stimulator (as of 05/2015)   Vaccine counseling 11/16/2021   VENOUS INSUFFICIENCY    Being followed by Dr. Sharol Given as of 10/2016 for two R LL venous stasis ulcers/skin tears.  Healed as of 10/30/16 f/u with Dr. Sharol Given.   Venous stasis dermatitis 12/27/2021   VENTRAL HERNIA     PSH:  Past Surgical History:  Procedure Laterality Date   AMPUTATION Left 04/11/2013   Procedure: AMPUTATION DIGIT Left 3rd toe;  Surgeon: [name], MD;  Location: [place];  Service: Orthopedics;  Laterality: Left;  Left 3rd toe amputation at [facility]   AMPUTATION Left 08/29/2019   Procedure: LEFT TRANSMETATARSAL AMPUTATION;  Surgeon: [name], MD;  Location: [place];  Service: Orthopedics;  Laterality: Left;   BIOPSY  05/04/2020   Procedure: BIOPSY;  Surgeon:  Jackquline Denmark, MD;  Location: [name] ENDOSCOPY;  Service: Endoscopy;;   [place]; 03/10/16   1997 Non-obstructive disease.  03/2016 BMS to RCA, with 25% pDiag dz, o/w normal cors per cath 03/07/16.  Cath 11/01/16: in stent restenosis, successful baloon angioplasty. 08/2020 branch vessel dz, cont medical therapy rec'd.   CARDIAC CATHETERIZATION  12/24/2012   mild < 20% LCx, prox 30% RCA; LVEF 55-65% , moderate pulmonary HTN, moderate AS   CARDIAC DEFIBRILLATOR PLACEMENT  11/07/2017   [place] MRI Quad CRT defibrillator   CARDIOVASCULAR STRESS TEST  05/11/16 ([facility])   2017 Myocardial perfusion imaging:  No ischemia; scar in apex, global hypokinesis, EF 36%.  06/15/20->mod primarily fixed inferolat wall defect mildly worse with stress c/w infarct/scar with mild peri-infarct ischemia, normal EF-->for cath per cards.   Carotid dopplers  03/09/2016   [facility]: no hemodynamically significant stenosis on either side.   CHOLECYSTECTOMY     COLONOSCOPY [name] 10/30/2012   Procedure: COLONOSCOPY;  Surgeon: [name], MD;  Location: [facility] ENDOSCOPY;  Service: Endoscopy;  Laterality: [name];   CORONARY ANGIOPLASTY WITH STENT PLACEMENT  03/2016; 04/2017   2017-[facility]: BMS to RCA-pt was placed on Brilinta.  04/2017: DES to RCA.   DORSAL SLIT [name] 10/29/2019   for severe phimosis. Procedure: DORSAL SLIT;  Surgeon: Alexis Frock, MD;  Location: [facility] ORS;  Service: Urology;  Laterality: [name];  [place]  05/04/2020   Procedure: ESOPHAGEAL DILATION;  Surgeon: Jackquline Denmark, MD;  Location[facility] ENDOSCOPY;  Service: Endoscopy;;   ESOPHAGOGASTRODUODENOSCOPY (EGD) WITH PROPOFOL [name] 05/04/2020   Procedure: ESOPHAGOGASTRODUODENOSCOPY (EGD) WITH PROPOFOL;  Surgeon: Jackquline Denmark, MD;  Location: [facility] ENDOSCOPY;  Service: Endoscopy;  Laterality: [name];   EYE SURGERY Bilateral cataract   HEMORRHOID SURGERY     INTRAOCULAR LENS INSERTION Bilateral    KNEE SURGERY Right    LEFT AND RIGHT HEART  CATHETERIZATION WITH CORONARY ANGIOGRAM [name] 12/24/2012   Procedure: LEFT AND RIGHT HEART CATHETERIZATION WITH CORONARY ANGIOGRAM;  Surgeon: [name], MD;  Location: [facility] CATH LAB;  Service: Cardiovascular;  Laterality: [name];   LEG SURGERY Bilateral    lenghtening    PACEMAKER PLACEMENT  04/13/2016   2nd deg HB after TAVR, pt had DC MDT PPM placed.   SHOULDER ARTHROSCOPY  08/30/2011   Procedure: ARTHROSCOPY SHOULDER;  Surgeon: [name], MD;  Location: [place];  Service: Orthopedics;  Laterality: Right;  Right Shoulder Arthroscopy, Debridement, and Decompression   SPINAL CORD STIMULATOR INSERTION [name] 09/10/2015   Procedure: LUMBAR SPINAL CORD STIMULATOR INSERTION;  Surgeon: [name], MD;  Location: [place] NEURO ORS;  Service: Neurosurgery;  Laterality: [name];  TEE WITHOUT CARDIOVERSION [name] 09/16/2019   Procedure: TRANSESOPHAGEAL ECHOCARDIOGRAM (TEE);  Surgeon: Skeet Latch, MD;  Location: [facility] ENDOSCOPY;  Service: Cardiovascular;  Laterality: [name];   TEE WITHOUT CARDIOVERSION [name] 12/02/2019   +vegetation on AVR and pacer lead in RV.  EF 55-60%, normal wall motion.  Valves function normal.  Procedure: TRANSESOPHAGEAL ECHOCARDIOGRAM (TEE);  Surgeon: Geralynn Rile, MD;  Location: [place];  Service: Cardiovascular;  Laterality: [name];   TOE AMPUTATION Left    due to osteomyelitis.  R big toe surg due to osteoarth   TONSILLECTOMY     traeculectomy Left    eye   TRANSCATHETER AORTIC VALVE REPLACEMENT, TRANSFEMORAL  04/11/2016   TRANSESOPHAGEAL ECHOCARDIOGRAM  03/09/2016; 09/2019   [facility]: EF 55-60%, PFO seen with bi-directional shunting, no thrombus in appendage.  09/2019 ->no valvular vegetations. Small patent foramen ovale with predominantly left to right shunting across the interatrial septum.   TRANSTHORACIC ECHOCARDIOGRAM  01/2015; 01/2016; 05/18/16; 09/18/16, 05/2017, 08/2017   01/2015 No signif change in aortic stenosis (moderate).  01/2016 Severe LVH w/small LV cavity, EF 60-65%,  grade I diast dysfxn.  05/2016 (s/p TAVR): EF 50-55%, grd I DD, biopros AV good.  08/2016--EF 50-55%, LV septal motion c/w conduction abnl, grd I DD,mild MS,bioprosth aortic valve well seated, w/trace AR. 05/2017 TTE EF 35%. 08/2017-EF 35%, mod diff hypokin LV, grd I DD, biopros AV good.    TRANSTHORACIC ECHOCARDIOGRAM  04/2018; 09/2019   04/2018: EF 40-45%, mod diffuse LV hypokin, grd I DD, bioprosth AV well seated, no AS or AR. 09/2019 EF 60-65%, grd I DD, valves fine, including bioprosth AV. 04/29/20 (tech diff) EF 55-60%, grd I DD, vegetation on MV.  07/2021 EF 55-60% (s/p upgrade to BiV PPM), AV well seated.   VITRECTOMY      MEDS:  Outpatient Medications Prior to Visit  Medication Sig Dispense Refill   albuterol (VENTOLIN HFA) 108 (90 Base) MCG/ACT inhaler Inhale 2 puffs into the lungs every 6 (six) hours as needed for wheezing or shortness of breath. 18 g 0   allopurinol (ZYLOPRIM) 300 MG tablet Take 1 tablet (300 mg total) by mouth daily. with food 90 tablet 1   amoxicillin (AMOXIL) 500 MG capsule TAKE 2 CAPSULES BY MOUTH TWICE A DAY 120 capsule 11   apixaban (ELIQUIS) 5 MG TABS tablet Take 5 mg by mouth 2 (two) times daily.     ASPIRIN LOW DOSE 81 MG EC tablet Take 81 mg by mouth daily.     B Complex-C (B-COMPLEX WITH VITAMIN C) tablet Take 1 tablet by mouth daily. Take 1 tablet daily, '250mg'$      ezetimibe (ZETIA) 10 MG tablet Take 1 tablet (10 mg total) by mouth daily. 30 tablet 1   ferrous gluconate (FERGON) 324 MG tablet Take 324 mg by mouth 2 (two) times daily.     finasteride (PROSCAR) 5 MG tablet TAKE 1 TABLET BY MOUTH EVERY DAY 90 tablet 0   gabapentin (NEURONTIN) 800 MG tablet TAKE 1 TABLET BY MOUTH FOUR TIMES A DAY 120 tablet 0   ipratropium (ATROVENT) 0.03 % nasal spray 2 SPRAY EACH NOSTRIL EVERY 12 HOURS FOR RUNNY NOSE 30 mL 2   Loratadine (CLARITIN PO) Take 1 tablet by mouth in the morning and at bedtime.     mometasone-formoterol (DULERA) 100-5 MCG/ACT AERO Inhale 2 puffs into  the lungs as needed for wheezing or shortness of breath.     Multiple Vitamin (MULTIVITAMIN ADULT PO) Take 1 tablet by mouth daily.  niacin (NIASPAN) 1000 MG CR tablet TAKE 1 TABLET BY MOUTH TWICE A DAY 180 tablet 3   nitroGLYCERIN (NITROSTAT) 0.4 MG SL tablet Place 0.4 mg under the tongue every 5 (five) minutes as needed for chest pain.     Oxycodone HCl 20 MG TABS 1 tab po bid prn severe pain 60 tablet 0   Oxymetazoline HCl (NASAL SPRAY NA) Place into the nose.     pantoprazole (PROTONIX) 40 MG tablet Take 1 tablet (40 mg total) by mouth daily. 180 tablet 1   potassium chloride SA (KLOR-CON M20) 20 MEQ tablet Take 1 tablet (20 mEq total) by mouth daily. 90 tablet 1   Probiotic Product (ALIGN PO) Take by mouth daily.     promethazine (PHENERGAN) 12.5 MG tablet Take 1 tablet (12.5 mg total) by mouth every 8 (eight) hours as needed for nausea or vomiting. 30 tablet 1   vitamin C (ASCORBIC ACID) 250 MG tablet Take 250 mg by mouth daily.     VITAMIN D, CHOLECALCIFEROL, PO Take 1 tablet by mouth daily.      WIXELA INHUB 250-50 MCG/ACT AEPB INHALE 1 PUFF BY MOUTH TWICE A DAY 60 each 5   No facility-administered medications prior to visit.   Physical Exam    09/13/2022   11:36 AM 09/01/2022    3:50 PM 06/20/2022   11:07 AM  Vitals with BMI  Height '6\' 4"'$  '6\' 4"'$  '6\' 4"'$   Weight 290 lbs 297 lbs 302 lbs 3 oz  BMI 35.31 0000000 [id number]  Systolic  [id number]  Diastolic  72 71  Pulse  92 110   General: Alert, chronically ill-appearing sitting in wheelchair. Cardiovascular: Regular rhythm and rate, soft systolic murmur.  No rub or gallop. Lungs are clear on inspiration, mild diffusely decreased aeration on exhalation with some soft end-expiratory wheeze bilaterally.  Breathing is nonlabored.  Pertinent labs/imaging See HPI above  ASSESSMENT/PLAN:  #1 chest pain, acute coronary syndrome ruled out during recent hospitalization. Stress test and echo did not show any evidence to suggest new  ischemia.  #2 CAD, see #1 above. Continue aspirin 81 mg daily and Zetia 10 mg daily.  3.  Acute on chronic anemia. He has baseline anemia of chronic disease. Recent drop in hemoglobin suggestive of possible GI bleeding. He is tolerating iron twice a day for the last week. CBC and iron panel today.  If hemoglobin has dropped significantly since his discharge last week we will have to get him off Eliquis and aspirin. Home Hemoccults x 3 ordered. Refer to GI-> digestive health specialist in [place].  (Patient has no history of GI bleed.  He had a screening colonoscopy in 2014 which was normal and an EGD in 2021 which showed few gastric polyps that were removed, otherwise no abnormalities).  #3 orthostatic dizziness/hypotension. He has a significant history of hypotension.  He is off all antihypertensives at this time. His anemia is likely contributing to his symptoms.  #4 ischemic cardiomyopathy. His blood pressure will not tolerate Entresto or beta-blocker. Will consider Jardiance in the near future.  #5 acute bronchitis/COPD exacerbation. Prednisone 40 mg daily x 5 days prescribed today. Albuterol every 6 hours as needed.  Medical decision making of high complexity was utilized today.  FOLLOW UP:  1 wk  Signed:  Crissie Sickles, MD           09/13/2022

## 2022-09-19 ENCOUNTER — Ambulatory Visit (INDEPENDENT_AMBULATORY_CARE_PROVIDER_SITE_OTHER): Payer: Medicare Other | Admitting: Orthopedic Surgery

## 2022-09-19 DIAGNOSIS — Z89432 Acquired absence of left foot: Secondary | ICD-10-CM

## 2022-09-21 ENCOUNTER — Ambulatory Visit: Payer: Medicare Other | Admitting: Family Medicine

## 2022-09-21 ENCOUNTER — Encounter: Payer: Self-pay | Admitting: Family Medicine

## 2022-09-21 VITALS — BP 126/78 | HR 71 | Temp 97.8°F | Ht 76.0 in | Wt 290.0 lb

## 2022-09-21 DIAGNOSIS — R079 Chest pain, unspecified: Secondary | ICD-10-CM | POA: Diagnosis not present

## 2022-09-21 DIAGNOSIS — D500 Iron deficiency anemia secondary to blood loss (chronic): Secondary | ICD-10-CM

## 2022-09-21 DIAGNOSIS — H04123 Dry eye syndrome of bilateral lacrimal glands: Secondary | ICD-10-CM

## 2022-09-21 DIAGNOSIS — J441 Chronic obstructive pulmonary disease with (acute) exacerbation: Secondary | ICD-10-CM

## 2022-09-21 NOTE — Addendum Note (Signed)
Addended by: Deveron Furlong D on: 09/21/2022 03:28 PM   Modules accepted: Orders

## 2022-09-21 NOTE — Progress Notes (Signed)
OFFICE VISIT  09/21/2022  CC:  Chief Complaint  Patient presents with   Medical Management of Chronic Issues    Patient is a 82 y.o. male who presents for 1 week follow-up COPD exacerbation. A/P as of last visit: "#1 chest pain, acute coronary syndrome ruled out during recent hospitalization. Stress test and echo did not show any evidence to suggest new ischemia.   #2 CAD, see #1 above. Continue aspirin 81 mg daily and Zetia 10 mg daily.   3.  Acute on chronic anemia. He has baseline anemia of chronic disease. Recent drop in hemoglobin suggestive of possible GI bleeding. He is tolerating iron twice a day for the last week. CBC and iron panel today.  If hemoglobin has dropped significantly since his discharge last week we will have to get him off Eliquis and aspirin. Home Hemoccults x 3 ordered. Refer to GI-> digestive health specialist in [place].   (Patient has no history of GI bleed.  He had a screening colonoscopy in 2014 which was normal and an EGD in 2021 which showed few gastric polyps that were removed, otherwise no abnormalities).   #3 orthostatic dizziness/hypotension. He has a significant history of hypotension.  He is off all antihypertensives at this time. His anemia is likely contributing to his symptoms.   #4 ischemic cardiomyopathy. His blood pressure will not tolerate Entresto or beta-blocker. Will consider Jardiance in the near future.   #5 acute bronchitis/COPD exacerbation. Prednisone 40 mg daily x 5 days prescribed today. Albuterol every 6 hours as needed."  INTERIM HX: Feeling much better. More energy, less wheezing and coughing. No periods of dizziness anymore. Blood pressures have been up into the normal range.  Feels like he has a film over his eyes, intermittent blurring of his vision, some excessive watering lately.  Not really itchy.  No pain.  No double vision or loss of visual field. Tried some moisturizing eyedrops last night and it did  help for a while.   Past Medical History:  Diagnosis Date   AICD (automatic cardioverter/defibrillator) present    Balanitis    +severe phimosis+ buried penis->circ not possible but dorsal slit done 10/2019   BPH (benign prostatic hypertrophy)    with urinary retention.  Renal u/s 12/04/19 NORMAL KIDNEYS, NORMAL BLADDER, NO HYDRONEPHROSIS   CAD (coronary artery disease)    Nonobstructive by 12/2012 cath; then 03/2016 he required BMS to RCA ([facility]).  In-stent restenosis on cath 11/01/16, baloon angioplasty successful.   CATARACT, HX OF    Chronic combined systolic and diastolic CHF (congestive heart failure) ([place]) 05/2016   Ischemic CM.  04/2018 EF 40-45%, grd I DD.  07/2021 EF 55-60%, AV well seated   Chronic pain syndrome    Lumbar DDD; chronic neuropathic pain (DM); has spinal stimulator and sees pain mgmt MD   Complicated UTI (urinary tract infection) 09/2019   Phimoses, acute urinary retention, entoerococcus UTI, enterococc bacteremia.  F/u blood clx's neg x 5d.     COPD    Debilitated patient    Diabetes mellitus type 2 with complications ([place])    0000000 jumped from 5.7% to 6.1% 03/2015---started metformin at that time.  DM 2 dx by fasting gluc criteria 2018.  Has chronic neuropath pain   Enterococcal bacteremia 09/2019   Phimoses, acute urinary retention, entoerococcus UTI, enterococc bacteremia.  F/u blood clx's neg x 5d.     Essential hypertension, benign    Fatty liver 2007   2007 u/s showed fatty liver with hepatosplenomegaly.  2019 repeat u/s->fatty liver but no cirrhosis or hepatosplenomegaly.   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 Tommy Medal)   Lumbosacral neuritis    Lumbosacral spondylosis    Lumbar spinal stenosis with neurogenic claudication--contributes to his chronic pain syndrome   Morbid obesity ([place])    Normal memory function 08/2014   Neuropsychological testing ([facility]): no cognitive impairment or sign of neurodegenerative disorder.  Likely has adjustment d/o with mixed anxiety/depressed features and may benefit from low dose SNRI.     Normocytic anemia 03/2016   Mild-pt needs ferritin and vit B12 level checked (as of 03/22/16). Hb stable 09/2019.  Hemoccult x 3 NEG 03/2022   NSTEMI (non-ST elevated myocardial infarction) ([place]) 03/20/2016   BMS to RCA   Obesity hypoventilation syndrome (HCC)    Orthostatic hypotension    OSA on CPAP    8 cm H2O   OSTEOARTHRITIS    Paroxysmal atrial fibrillation ([place]) 2003    (? chronic?) Off anticoag for a while due to falls.  Then apixaban started 12/2014.   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 Ella Bodo): neuropathic pain->diff to treat, failed nucynta, failed spinal stimul trial, oxycontin hs + tramadol + gabap as of 12/2017 f/u Dr. Letta Pate.   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   Sick sinus syndrome (HCC)    PPM  placed   Thrombocytopenia ([place]) 2018   HSM on 2007 abd u/s---suspect some mild splenic sequestration chronically.   Unspecified glaucoma(365.9)    Unspecified hereditary and idiopathic peripheral neuropathy approx age 75   bilat LE's, ? left arm, too.  Feet became progressively numb + left foot pain intermittently.  Pt may be trying a spinal stimulator (as of 05/2015)   Vaccine counseling 11/16/2021   VENOUS INSUFFICIENCY    Being followed by Dr. Sharol Given as of 10/2016 for two R LL venous stasis ulcers/skin tears.  Healed as of 10/30/16 f/u with Dr. Sharol Given.   Venous stasis dermatitis 12/27/2021   VENTRAL HERNIA     Past Surgical History:  Procedure Laterality Date   AMPUTATION Left 04/11/2013   Procedure: AMPUTATION DIGIT Left 3rd toe;  Surgeon: [name], MD;  Location: [place];  Service: Orthopedics;  Laterality: Left;  Left 3rd toe amputation at [facility]   AMPUTATION Left 08/29/2019   Procedure: LEFT TRANSMETATARSAL AMPUTATION;  Surgeon: [name], MD;  Location:  [facility];  Service: Orthopedics;  Laterality: Left;   BIOPSY  05/04/2020   Procedure: BIOPSY;  Surgeon: Jackquline Denmark, MD;  Location: [facility] ENDOSCOPY;  Service: Endoscopy;;   [place]; 03/10/16   1997 Non-obstructive disease.  03/2016 BMS to RCA, with 25% pDiag dz, o/w normal cors per cath 03/07/16.  Cath 11/01/16: in stent restenosis, successful baloon angioplasty. 08/2020 branch vessel dz, cont medical therapy rec'd.   CARDIAC CATHETERIZATION  12/24/2012   mild < 20% LCx, prox 30% RCA; LVEF 55-65% , moderate pulmonary HTN, moderate AS   CARDIAC DEFIBRILLATOR PLACEMENT  11/07/2017   [facility] MRI Quad CRT defibrillator   CARDIOVASCULAR STRESS TEST  05/11/16 ([facility])   2017 Myocardial perfusion imaging:  No ischemia; scar in apex, global hypokinesis, EF 36%.  06/15/20->mod primarily fixed inferolat wall defect mildly worse with stress c/w infarct/scar with mild peri-infarct ischemia, normal EF-->for cath per cards.   Carotid  dopplers  03/09/2016   [facility]: no hemodynamically significant stenosis on either side.   CHOLECYSTECTOMY     COLONOSCOPY [name] 10/30/2012   Procedure: COLONOSCOPY;  Surgeon: [name], MD;  Location: [facility] ENDOSCOPY;  Service: Endoscopy;  Laterality: [name];   CORONARY ANGIOPLASTY WITH STENT PLACEMENT  03/2016; 04/2017   2017-[facility]: BMS to RCA-pt was placed on Brilinta.  04/2017: DES to RCA.   DORSAL SLIT [name] 10/29/2019   for severe phimosis. Procedure: DORSAL SLIT;  Surgeon: Alexis Frock, MD;  Location: [facility] ORS;  Service: Urology;  Laterality: [name];  [place]  05/04/2020   Procedure: ESOPHAGEAL DILATION;  Surgeon: Jackquline Denmark, MD;  Location: [facility] ENDOSCOPY;  Service: Endoscopy;;   ESOPHAGOGASTRODUODENOSCOPY (EGD) WITH PROPOFOL [name] 05/04/2020   Procedure: ESOPHAGOGASTRODUODENOSCOPY (EGD) WITH PROPOFOL;  Surgeon: Jackquline Denmark, MD;  Location: [facility] ENDOSCOPY;  Service: Endoscopy;  Laterality: [name];   EYE SURGERY Bilateral cataract   HEMORRHOID SURGERY     INTRAOCULAR LENS INSERTION Bilateral    KNEE SURGERY Right    LEFT AND RIGHT HEART CATHETERIZATION WITH CORONARY ANGIOGRAM [name] 12/24/2012   Procedure: LEFT AND RIGHT HEART CATHETERIZATION WITH CORONARY ANGIOGRAM;  Surgeon: [name], MD;  Location: [facility] CATH LAB;  Service: Cardiovascular;  Laterality: [name];   LEG SURGERY Bilateral    lenghtening    PACEMAKER PLACEMENT  04/13/2016   2nd deg HB after TAVR, pt had DC MDT PPM placed.   SHOULDER ARTHROSCOPY  08/30/2011   Procedure: ARTHROSCOPY SHOULDER;  Surgeon: [name], MD;  Location: [place];  Service: Orthopedics;  Laterality: Right;  Right Shoulder Arthroscopy, Debridement, and Decompression   SPINAL CORD STIMULATOR INSERTION [name] 09/10/2015   Procedure: LUMBAR SPINAL CORD STIMULATOR INSERTION;  Surgeon: [name], MD;  Location: [place] NEURO ORS;  Service: Neurosurgery;  Laterality: [name];   TEE WITHOUT CARDIOVERSION [name] 09/16/2019   Procedure: TRANSESOPHAGEAL  ECHOCARDIOGRAM (TEE);  Surgeon: Skeet Latch, MD;  Location: [name] ENDOSCOPY;  Service: Cardiovascular;  Laterality: [name];   TEE WITHOUT CARDIOVERSION [name] 12/02/2019   +vegetation on AVR and pacer lead in RV.  EF 55-60%, normal wall motion.  Valves function normal.  Procedure: TRANSESOPHAGEAL ECHOCARDIOGRAM (TEE);  Surgeon: Geralynn Rile, MD;  Location: [place];  Service: Cardiovascular;  Laterality: [name];   TOE AMPUTATION Left    due to osteomyelitis.  R big toe surg due to osteoarth   TONSILLECTOMY     traeculectomy Left    eye   TRANSCATHETER AORTIC VALVE REPLACEMENT, TRANSFEMORAL  04/11/2016   TRANSESOPHAGEAL ECHOCARDIOGRAM  03/09/2016; 09/2019  [facility]: EF 55-60%, PFO seen with bi-directional shunting, no thrombus in appendage.  09/2019 ->no valvular vegetations. Small patent foramen ovale with predominantly left to right shunting across the interatrial septum.   TRANSTHORACIC ECHOCARDIOGRAM  01/2015; 01/2016; 05/18/16; 09/18/16, 05/2017, 08/2017   01/2015 No signif change in aortic stenosis (moderate).  01/2016 Severe LVH w/small LV cavity, EF 60-65%, grade I diast dysfxn.  05/2016 (s/p TAVR): EF 50-55%, grd I DD, biopros AV good.  08/2016--EF 50-55%, LV septal motion c/w conduction abnl, grd I DD,mild MS,bioprosth aortic valve well seated, w/trace AR. 05/2017 TTE EF 35%. 08/2017-EF 35%, mod diff hypokin LV, grd I DD, biopros AV good.    TRANSTHORACIC ECHOCARDIOGRAM  04/2018; 09/2019   04/2018: EF 40-45%, mod diffuse LV hypokin, grd I DD, bioprosth AV well seated, no AS or AR. 09/2019 EF 60-65%, grd I DD, valves fine, including bioprosth AV. 04/29/20 (tech diff) EF 55-60%, grd I DD, vegetation on MV.  07/2021 EF 55-60% (s/p upgrade to BiV PPM), AV well seated.   VITRECTOMY      Outpatient Medications Prior to Visit  Medication Sig Dispense Refill   albuterol (VENTOLIN HFA) 108 (90 Base) MCG/ACT inhaler Inhale 2 puffs into the lungs every 6 (six) hours as needed for wheezing or shortness of  breath. 18 g 0   allopurinol (ZYLOPRIM) 300 MG tablet Take 1 tablet (300 mg total) by mouth daily. with food 90 tablet 1   amoxicillin (AMOXIL) 500 MG capsule TAKE 2 CAPSULES BY MOUTH TWICE A DAY 120 capsule 11   apixaban (ELIQUIS) 5 MG TABS tablet Take 5 mg by mouth 2 (two) times daily.     ASPIRIN LOW DOSE 81 MG EC tablet Take 81 mg by mouth daily.     B Complex-C (B-COMPLEX WITH VITAMIN C) tablet Take 1 tablet by mouth daily. Take 1 tablet daily, 250mg      ezetimibe (ZETIA) 10 MG tablet Take 1 tablet (10 mg total) by mouth daily. 30 tablet 1   ferrous gluconate (FERGON) 324 MG tablet Take 324 mg by mouth 2 (two) times daily.     finasteride (PROSCAR) 5 MG tablet TAKE 1 TABLET BY MOUTH EVERY DAY 90 tablet 0   gabapentin (NEURONTIN) 800 MG tablet TAKE 1 TABLET BY MOUTH FOUR TIMES A DAY 120 tablet 0   ipratropium (ATROVENT) 0.03 % nasal spray 2 SPRAY EACH NOSTRIL EVERY 12 HOURS FOR RUNNY NOSE 30 mL 2   Loratadine (CLARITIN PO) Take 1 tablet by mouth in the morning and at bedtime.     mometasone-formoterol (DULERA) 100-5 MCG/ACT AERO Inhale 2 puffs into the lungs as needed for wheezing or shortness of breath.     Multiple Vitamin (MULTIVITAMIN ADULT PO) Take 1 tablet by mouth daily.     niacin (NIASPAN) 1000 MG CR tablet TAKE 1 TABLET BY MOUTH TWICE A DAY 180 tablet 3   Oxycodone HCl 20 MG TABS 1 tab po bid prn severe pain 60 tablet 0   Oxymetazoline HCl (NASAL SPRAY NA) Place into the nose.     pantoprazole (PROTONIX) 40 MG tablet Take 1 tablet (40 mg total) by mouth daily. 180 tablet 1   potassium chloride SA (KLOR-CON M20) 20 MEQ tablet Take 1 tablet (20 mEq total) by mouth daily. 90 tablet 1   predniSONE (DELTASONE) 20 MG tablet 2 tabs po qd x 5d then 1 tab po qd x 5d 15 tablet 0   Probiotic Product (ALIGN PO) Take by mouth daily.     promethazine (  PHENERGAN) 12.5 MG tablet Take 1 tablet (12.5 mg total) by mouth every 8 (eight) hours as needed for nausea or vomiting. 30 tablet 1   vitamin C  (ASCORBIC ACID) 250 MG tablet Take 250 mg by mouth daily.     VITAMIN D, CHOLECALCIFEROL, PO Take 1 tablet by mouth daily.      WIXELA INHUB 250-50 MCG/ACT AEPB INHALE 1 PUFF BY MOUTH TWICE A DAY 60 each 5   nitroGLYCERIN (NITROSTAT) 0.4 MG SL tablet Place 0.4 mg under the tongue every 5 (five) minutes as needed for chest pain. (Patient not taking: Reported on 09/21/2022)     No facility-administered medications prior to visit.    Allergies  Allergen Reactions   Brimonidine Tartrate Shortness Of Breath    Alphagan-Shortness of breath   Brinzolamide Shortness Of Breath    AZOPT- Shortness of breath   Latanoprost Shortness Of Breath    XALATAN- Shortness of breath   Nucynta [Tapentadol] Shortness Of Breath   Sulfa Antibiotics Palpitations   Timolol Maleate Shortness Of Breath and Other (See Comments)    TIMOPTIC- Aggravated asthma   Diltiazem Swelling     leg swelling   Rofecoxib Swelling     VIOXX- leg swelling   Vancomycin Rash and Other (See Comments)    Blisters   Codeine Other (See Comments)    Childhood reaction   Tamsulosin Other (See Comments)    Dizziness    Celecoxib Other (See Comments)    CELLBREX-confusion   Colchicine Diarrhea    diarrhea   Tape Rash    Review of Systems As per HPI  PE:    09/21/2022    2:53 PM 09/13/2022   11:36 AM 09/01/2022    3:50 PM  Vitals with BMI  Height 6\' 4"  6\' 4"  6\' 4"   Weight 290 lbs 290 lbs 297 lbs  BMI 35.31 [id number]  Systolic [id number]  Diastolic 79 81 72  Pulse 71 87 92     Physical Exam  Gen: Alert, well appearing.  Patient is oriented to person, place, time, and situation. Eyes: No swelling, erythema, or drainage. Extraocular movements intact.  PERRLA. Vascular: Regular rhythm and rate with soft systolic murmur. Lungs are clear to auscultation bilaterally, aeration is good and breathing is nonlabored. Extremities show trace bilateral lower extremity pitting edema palpable through compression  stockings.  LABS:  Last CBC Lab Results  Component Value Date   WBC 5.1 02/10/2022   HGB 11.3 (L) 02/10/2022   HCT 33.7 (L) 02/10/2022   MCV 92.1 02/10/2022   MCH 30.9 02/10/2022   RDW 14.3 02/10/2022   PLT 124 (L) 02/10/2022   Lab Results  Component Value Date   IRON 173 12/20/2021   TIBC 262 12/20/2021   FERRITIN 60 [id number]   Last metabolic panel Lab Results  Component Value Date   GLUCOSE 130 (H) 02/10/2022   NA 141 02/10/2022   K 4.1 02/10/2022   CL 107 02/10/2022   CO2 27 02/10/2022   BUN 12 02/10/2022   CREATININE 0.67 (L) 02/10/2022   GFRNONAA >60 12/15/2021   CALCIUM 9.3 02/10/2022   PROT 6.1 02/10/2022   ALBUMIN 2.8 (L) 12/15/2021   BILITOT 0.6 02/10/2022   ALKPHOS 49 12/15/2021   AST 24 02/10/2022   ALT 17 02/10/2022   ANIONGAP 10 12/15/2021   Last hemoglobin A1c Lab Results  Component Value Date   HGBA1C 6.0 (A) 09/01/2022   HGBA1C 6.0 09/01/2022   HGBA1C 6.0 09/01/2022  HGBA1C 6.0 09/01/2022   IMPRESSION AND PLAN:  #1 COPD exacerbation. Resolving appropriately with prednisone.  He has a few more days of 20 mg dose.  Continue albuterol as needed and wixela inhub 1p bid.  #2 dry eye syndrome. Encouraged use of over-the-counter moisturizing drops. For itching and drainage it is okay to try over-the-counter Zaditor drops.  #3 history of subacute osteoarthritis of ankle and foot. Recent visit with Dr. Sharol Given to went very well and he was very pleased.  He has been released to as needed follow-up.  4.  Iron deficiency anemia. Has GI appointment for later this month. CBC and iron panel today.  An After Visit Summary was printed and given to the patient.  FOLLOW UP: Return in about 3 months (around 12/22/2022) for routine chronic illness f/u.  Signed:  Crissie Sickles, MD           09/21/2022

## 2022-09-22 LAB — BASIC METABOLIC PANEL
BUN: 15 mg/dL (ref 6–23)
CO2: 28 mEq/L (ref 19–32)
Calcium: 8.6 mg/dL (ref 8.4–10.5)
Chloride: 105 mEq/L (ref 96–112)
Creatinine, Ser: 0.76 mg/dL (ref 0.40–1.50)
GFR: 83.52 mL/min (ref >60.00)
Glucose, Bld: 175 mg/dL — ABNORMAL HIGH (ref 70–99)
Potassium: 4.1 mEq/L (ref 3.5–5.1)
Sodium: 142 mEq/L (ref 135–145)

## 2022-09-22 LAB — CBC
HCT: 31.6 % — ABNORMAL LOW (ref 39.0–52.0)
Hemoglobin: 10.2 g/dL — ABNORMAL LOW (ref 13.0–17.0)
MCHC: 32.2 g/dL (ref 30.0–36.0)
MCV: 88.4 fl (ref 78.0–100.0)
Platelets: 204 10*3/uL (ref 150.0–400.0)
RBC: 3.57 Mil/uL — ABNORMAL LOW (ref 4.22–5.81)
RDW: 17.9 % — ABNORMAL HIGH (ref 11.5–15.5)
WBC: 6.7 10*3/uL (ref 4.0–10.5)

## 2022-09-22 LAB — IRON,TIBC AND FERRITIN PANEL
%SAT: 76 % (calc) — ABNORMAL HIGH (ref 20–48)
Ferritin: 12 ng/mL — ABNORMAL LOW (ref 24–380)
Iron: 218 ug/dL — ABNORMAL HIGH (ref 50–180)
TIBC: 285 mcg/dL (calc) (ref 250–425)

## 2022-09-24 ENCOUNTER — Encounter: Payer: Self-pay | Admitting: Orthopedic Surgery

## 2022-09-24 NOTE — Progress Notes (Signed)
Office Visit Note   Patient: Tim [name]           Date of Birth: [birth year]           MRN: [id number] Visit Date: 09/19/2022              Requested by: Tammi Sou, MD [address] [place],  [place] [address] PCP: Tammi Sou, MD  Chief Complaint  Patient presents with   Left Foot - Wound Check    Hx left transmet amputation      HPI: Patient is a 82 year old gentleman who presents in follow-up for ulceration left transmetatarsal amputation.  Assessment & Plan: Visit Diagnoses:  1. History of transmetatarsal amputation of left foot (HCC)     Plan: Ulcer was debrided of skin and soft tissue the underlying tissue was healthy and viable no tunneling no abscess no signs of infection.  Follow-Up Instructions: No follow-ups on file.   Ortho Exam  [name], oriented, no adenopathy, well-dressed, normal affect, normal respiratory effort. Examination patient has a Wagner grade 1 ulcer left transmetatarsal amputation.  After informed consent a 10 blade knife was used to debride the skin and soft tissue back to healthy viable tissue.  There is no exposed bone or tendon.  There is no cellulitis no odor no drainage.  The ulcer is 2 cm in diameter and 2 mm deep after debridement.  Imaging: No results found. No images are attached to the encounter.  Labs: Lab Results  Component Value Date   HGBA1C 6.0 (A) 09/01/2022   HGBA1C 6.0 09/01/2022   HGBA1C 6.0 09/01/2022   HGBA1C 6.0 09/01/2022   ESRSEDRATE 30 (H) 12/15/2021   ESRSEDRATE 34 (H) 11/24/2020   ESRSEDRATE 38 (H) 07/14/2020   CRP 1.3 11/24/2020   CRP 0.9 07/14/2020   CRP 0.7 05/03/2020   LABURIC 3.5 (L) 09/10/2013   REPTSTATUS 12/15/2021 FINAL 12/14/2021   CULT  12/14/2021    NO GROWTH Performed at [facility], [place] [address], [place], [place] [address]    LABORGA ENTEROCOCCUS FAECALIS 09/11/2019     Lab Results  Component Value Date   ALBUMIN 2.8 (L) 12/15/2021   ALBUMIN  3.2 (L) 12/14/2021   ALBUMIN 3.4 (L) 10/13/2021    Lab Results  Component Value Date   MG 1.7 05/07/2020   MG 1.6 (L) 05/06/2020   MG 1.8 05/05/2020   No results found for: "VD25OH"  No results found for: "PREALBUMIN"    Latest Ref Rng & Units 09/21/2022    3:22 PM 02/10/2022    4:15 PM 12/27/2021    9:35 AM  CBC EXTENDED  WBC 4.0 - 10.5 K/uL 6.7  5.1  4.7   RBC 4.22 - 5.81 Mil/uL 3.57  3.66  3.52   Hemoglobin 13.0 - 17.0 g/dL 10.2  11.3  11.0   HCT 39.0 - 52.0 % 31.6  33.7  32.9   Platelets 150.0 - 400.0 K/uL 204.0  124  76   NEUT# 1,500 - 7,800 cells/uL   2,355   Lymph# 850 - 3,900 cells/uL   1,612      There is no height or weight on file to calculate BMI.  Orders:  No orders of the defined types were placed in this encounter.  No orders of the defined types were placed in this encounter.    Procedures: No procedures performed  Clinical Data: No additional findings.  ROS:  All other systems negative, except  as noted in the HPI. Review of Systems  Objective: Vital Signs: There were no vitals taken for this visit.  Specialty Comments:  No specialty comments available.  PMFS History: Patient Active Problem List   Diagnosis Date Noted   Venous stasis dermatitis 12/27/2021   Cellulitis 12/14/2021   Wound of right lower extremity 12/14/2021   Transient hypotension 12/14/2021   Heart failure with preserved ejection fraction ([place]) 12/14/2021   Prediabetes 12/14/2021   Thrombocytopenia ([place]) 12/14/2021   Obesity, Class III, BMI 40-49.9 (morbid obesity) ([place]) 12/14/2021   BPH (benign prostatic hyperplasia) 12/14/2021   Vaccine counseling 11/16/2021   Weakness 02/23/2021   Physical deconditioning 02/23/2021   Rhinitis 07/14/2020   Mitral valve vegetation 05/28/2020   Non-pressure chronic ulcer of other part of left foot limited to breakdown of skin ([place])    Esophageal dysphagia    Bacteremia    Endocarditis of mitral valve    Fever 04/28/2020    Cervical lymphadenopathy 03/15/2020   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   ICD (implantable cardioverter-defibrillator) infection ([place]) 12/22/2019   Gram-positive bacteremia 11/27/2019   FUO (fever of unknown origin) 11/26/2019   Low blood pressure reading 11/05/2019   Wound dehiscence 10/22/2019   PFO (patent foramen ovale) 10/06/2019   ICD (implantable cardioverter-defibrillator) battery depletion    Pressure injury of skin 09/13/2019   Enterococcal bacteremia history  09/13/2019   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   Urinary retention 09/13/2019   Phimosis 09/13/2019   SIRS (systemic inflammatory response syndrome) ([place]) 09/12/2019   Sepsis ([place]) 09/12/2019   Enterococcus UTI 09/12/2019   Cellulitis of foot    History of transmetatarsal amputation of left foot ([place])    Subacute osteomyelitis, left ankle and foot ([place])    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 ([place]) 04/17/2018   Diabetic neuropathy, painful ([place]) 12/26/2016   Neuropathic pain of foot, left 06/20/2016   Insulin resistance 07/22/2015   Status post amputation of left great toe ([place]) 06/08/2015   Spinal stenosis of lumbar region 02/16/2015   Hereditary and idiopathic peripheral neuropathy 01/12/2015   Oral thrush 12/28/2014   Orthostatic dizziness 05/24/2014   Paresthesias with subjective weakness 01/30/2014   Bilateral thigh pain 01/30/2014   Abdominal wall pain in right upper quadrant 01/22/2014   Gross hematuria 09/25/2013   Intertrigo 09/25/2013   IBS (irritable bowel syndrome) 09/11/2013   Chronic pain syndrome 08/06/2013   CAD (coronary artery disease), native coronary artery 12/25/2012   Paroxysmal atrial fibrillation ([place]) 12/25/2012   Moderate aortic stenosis 12/25/2012   Aortic stenosis 12/24/2012   Personal history of colonic adenoma 10/30/2012    Diverticulosis of colon (without mention of hemorrhage) 10/30/2012   Internal hemorrhoids 10/30/2012   Change in bowel habits intermittent loose stools 10/30/2012   Routine health maintenance 06/09/2012   Hereditary peripheral neuropathy 10/10/2011   Long term current use of anticoagulant - apixiban 08/16/2010   Dyspnea on exertion 09/08/2009   HYPERCHOLESTEROLEMIA-PURE 01/13/2009   Essential hypertension, benign 01/13/2009   EDEMA 01/13/2009   GOUT 01/12/2009   GLAUCOMA 01/12/2009   Venous insufficiency (chronic) (peripheral) 01/12/2009   COPD (chronic obstructive pulmonary disease) ([place]) 01/12/2009   VENTRAL HERNIA 01/12/2009   OSTEOARTHRITIS 01/12/2009   ARTHRITIS 01/12/2009   DEGENERATIVE DISC DISEASE 01/12/2009   CATARACT, HX OF 01/12/2009   HEART MURMUR, HX OF 01/12/2009   BENIGN PROSTATIC HYPERTROPHY, HX OF 01/12/2009   Obesity 01/17/2007  OBSTRUCTIVE SLEEP APNEA 01/17/2007   ASTHMA 01/17/2007   GERD 01/17/2007   HALLUX VALGUS, ACQUIRED 01/17/2007   Pulmonary hypertension ([place]) 01/17/2007   Past Medical History:  Diagnosis Date   AICD (automatic cardioverter/defibrillator) present    Balanitis    +severe phimosis+ buried penis->circ not possible but dorsal slit done 10/2019   BPH (benign prostatic hypertrophy)    with urinary retention.  Renal u/s 12/04/19 NORMAL KIDNEYS, NORMAL BLADDER, NO HYDRONEPHROSIS   CAD (coronary artery disease)    Nonobstructive by 12/2012 cath; then 03/2016 he required BMS to RCA ([facility]).  In-stent restenosis on cath 11/01/16, baloon angioplasty successful.   CATARACT, HX OF    Chronic combined systolic and diastolic CHF (congestive heart failure) ([place]) 05/2016   Ischemic CM.  04/2018 EF 40-45%, grd I DD.  07/2021 EF 55-60%, AV well seated   Chronic pain syndrome    Lumbar DDD; chronic neuropathic pain (DM); has spinal stimulator and sees pain mgmt MD   Complicated UTI (urinary tract infection) 09/2019   Phimoses, acute urinary retention,  entoerococcus UTI, enterococc bacteremia.  F/u blood clx's neg x 5d.     COPD    Debilitated patient    Diabetes mellitus type 2 with complications ([place])    0000000 jumped from 5.7% to 6.1% 03/2015---started metformin at that time.  DM 2 dx by fasting gluc criteria 2018.  Has chronic neuropath pain   Enterococcal bacteremia 09/2019   Phimoses, acute urinary retention, entoerococcus UTI, enterococc bacteremia.  F/u blood clx's neg x 5d.     Essential hypertension, benign    Fatty liver 2007   2007 u/s showed fatty liver with hepatosplenomegaly.  2019 repeat u/s->fatty liver but no cirrhosis or hepatosplenomegaly.   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 Tommy Medal)   Lumbosacral neuritis    Lumbosacral spondylosis    Lumbar spinal stenosis with neurogenic claudication--contributes to his chronic pain syndrome   Morbid obesity ([place])    Normal memory function 08/2014   Neuropsychological testing ([facility]): no cognitive impairment or sign of neurodegenerative disorder.  Likely has adjustment d/o with mixed anxiety/depressed features and may benefit from low dose SNRI.     Normocytic anemia 03/2016   Mild-pt needs ferritin and vit B12 level checked (as of 03/22/16). Hb stable 09/2019.  Hemoccult x 3 NEG 03/2022   NSTEMI (non-ST elevated myocardial infarction) ([place]) 03/20/2016   BMS to RCA   Obesity hypoventilation syndrome (HCC)    Orthostatic hypotension    OSA on CPAP    8 cm H2O   OSTEOARTHRITIS    Paroxysmal atrial fibrillation ([place]) 2003    (? chronic?) Off anticoag for a while due to falls.  Then apixaban started  12/2014.   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 Ella Bodo): neuropathic pain->diff to treat, failed nucynta, failed spinal stimul trial, oxycontin hs + tramadol + gabap as of 12/2017 f/u Dr. Letta Pate.   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   Sick sinus syndrome (HCC)    PPM placed   Thrombocytopenia ([place]) 2018   HSM on 2007 abd u/s---suspect some mild splenic sequestration chronically.   Unspecified glaucoma(365.9)    Unspecified hereditary and idiopathic peripheral neuropathy approx age 70   bilat LE's, ? left arm, too.  Feet became progressively numb + left foot pain intermittently.  Pt may be trying a spinal stimulator (as of 05/2015)   Vaccine counseling 11/16/2021   VENOUS INSUFFICIENCY    Being followed by Dr. Sharol Given as of 10/2016 for two R LL venous stasis ulcers/skin tears.  Healed as of 10/30/16 f/u with Dr. Sharol Given.   Venous stasis dermatitis 12/27/2021   VENTRAL HERNIA     Family History  Problem Relation Age of Onset   Hypertension Mother    Coronary artery disease Mother    Heart attack Mother    Neuropathy Mother    Pulmonary fibrosis Father        asbestosis    Past Surgical History:  Procedure Laterality Date   AMPUTATION Left 04/11/2013   Procedure:  AMPUTATION DIGIT Left 3rd toe;  Surgeon: [name], MD;  Location: [facility];  Service: Orthopedics;  Laterality: Left;  Left 3rd toe amputation at [facility]   AMPUTATION Left 08/29/2019   Procedure: LEFT TRANSMETATARSAL AMPUTATION;  Surgeon: [name], MD;  Location: [facility];  Service: Orthopedics;  Laterality: Left;   BIOPSY  05/04/2020   Procedure: BIOPSY;  Surgeon: Jackquline Denmark, MD;  Location: [facility] ENDOSCOPY;  Service: Endoscopy;;   [place]; 03/10/16   1997 Non-obstructive disease.  03/2016 BMS to RCA, with 25% pDiag dz, o/w normal cors per cath 03/07/16.  Cath 11/01/16: in stent restenosis, successful baloon angioplasty. 08/2020 branch vessel dz, cont medical therapy rec'd.   CARDIAC CATHETERIZATION  12/24/2012   mild < 20% LCx, prox 30% RCA; LVEF 55-65% , moderate pulmonary HTN, moderate AS   CARDIAC DEFIBRILLATOR PLACEMENT  11/07/2017   [facility] MRI Quad CRT defibrillator   CARDIOVASCULAR STRESS TEST  05/11/16 ([facility])   2017 Myocardial perfusion imaging:  No ischemia; scar in apex, global hypokinesis, EF 36%.  06/15/20->mod primarily fixed inferolat wall defect mildly worse with stress c/w infarct/scar with mild peri-infarct ischemia, normal EF-->for cath per cards.   Carotid dopplers  03/09/2016   [facility]: no hemodynamically significant stenosis on either side.   CHOLECYSTECTOMY     COLONOSCOPY [name] 10/30/2012   Procedure: COLONOSCOPY;  Surgeon: [name], MD;  Location: [facility] ENDOSCOPY;  Service: Endoscopy;  Laterality: [name];   CORONARY ANGIOPLASTY WITH STENT PLACEMENT  03/2016; 04/2017   2017-[facility]: BMS to RCA-pt was placed on Brilinta.  04/2017: DES to RCA.   DORSAL SLIT [name] 10/29/2019   for severe phimosis. Procedure: DORSAL SLIT;  Surgeon: Alexis Frock, MD;  Location: [facility] ORS;  Service: Urology;  Laterality: [name];  [place]  05/04/2020  Procedure: ESOPHAGEAL DILATION;  Surgeon: Jackquline Denmark, MD;  Location: [facility] ENDOSCOPY;  Service: Endoscopy;;    ESOPHAGOGASTRODUODENOSCOPY (EGD) WITH PROPOFOL [name] 05/04/2020   Procedure: ESOPHAGOGASTRODUODENOSCOPY (EGD) WITH PROPOFOL;  Surgeon: Jackquline Denmark, MD;  Location: [facility]COPY;  Service: Endoscopy;  Laterality: [name];   EYE SURGERY Bilateral cataract   HEMORRHOID SURGERY     INTRAOCULAR LENS INSERTION Bilateral    KNEE SURGERY Right    LEFT AND RIGHT HEART CATHETERIZATION WITH CORONARY ANGIOGRAM [name] 12/24/2012   Procedure: LEFT AND RIGHT HEART CATHETERIZATION WITH CORONARY ANGIOGRAM;  Surgeon: [name], MD;  Location: [facility] CATH LAB;  Service: Cardiovascular;  Laterality: [name];   LEG SURGERY Bilateral    lenghtening    PACEMAKER PLACEMENT  04/13/2016   2nd deg HB after TAVR, pt had DC MDT PPM placed.   SHOULDER ARTHROSCOPY  08/30/2011   Procedure: ARTHROSCOPY SHOULDER;  Surgeon: [name], MD;  Location: [facility];  Service: Orthopedics;  Laterality: Right;  Right Shoulder Arthroscopy, Debridement, and Decompression   SPINAL CORD STIMULATOR INSERTION [name] 09/10/2015   Procedure: LUMBAR SPINAL CORD STIMULATOR INSERTION;  Surgeon: [name], MD;  Location: [facility] NEURO ORS;  Service: Neurosurgery;  Laterality: [name];   TEE WITHOUT CARDIOVERSION [name] 09/16/2019   Procedure: TRANSESOPHAGEAL ECHOCARDIOGRAM (TEE);  Surgeon: Skeet Latch, MD;  Location: [facility] ENDOSCOPY;  Service: Cardiovascular;  Laterality: [name];   TEE WITHOUT CARDIOVERSION [name] 12/02/2019   +vegetation on AVR and pacer lead in RV.  EF 55-60%, normal wall motion.  Valves function normal.  Procedure: TRANSESOPHAGEAL ECHOCARDIOGRAM (TEE);  Surgeon: Geralynn Rile, MD;  Location: [facility];  Service: Cardiovascular;  Laterality: [name];   TOE AMPUTATION Left    due to osteomyelitis.  R big toe surg due to osteoarth   TONSILLECTOMY     traeculectomy Left    eye   TRANSCATHETER AORTIC VALVE REPLACEMENT, TRANSFEMORAL  04/11/2016   TRANSESOPHAGEAL ECHOCARDIOGRAM  03/09/2016; 09/2019   [facility]: EF 55-60%, PFO seen with bi-directional  shunting, no thrombus in appendage.  09/2019 ->no valvular vegetations. Small patent foramen ovale with predominantly left to right shunting across the interatrial septum.   TRANSTHORACIC ECHOCARDIOGRAM  01/2015; 01/2016; 05/18/16; 09/18/16, 05/2017, 08/2017   01/2015 No signif change in aortic stenosis (moderate).  01/2016 Severe LVH w/small LV cavity, EF 60-65%, grade I diast dysfxn.  05/2016 (s/p TAVR): EF 50-55%, grd I DD, biopros AV good.  08/2016--EF 50-55%, LV septal motion c/w conduction abnl, grd I DD,mild MS,bioprosth aortic valve well seated, w/trace AR. 05/2017 TTE EF 35%. 08/2017-EF 35%, mod diff hypokin LV, grd I DD, biopros AV good.    TRANSTHORACIC ECHOCARDIOGRAM  04/2018; 09/2019   04/2018: EF 40-45%, mod diffuse LV hypokin, grd I DD, bioprosth AV well seated, no AS or AR. 09/2019 EF 60-65%, grd I DD, valves fine, including bioprosth AV. 04/29/20 (tech diff) EF 55-60%, grd I DD, vegetation on MV.  07/2021 EF 55-60% (s/p upgrade to BiV PPM), AV well seated.   VITRECTOMY     Social History   Occupational History   Occupation: Engineer, production    Comment: retired  Tobacco Use   Smoking status: Never   Smokeless tobacco: Never  Scientific laboratory technician Use: Never used  Substance and Sexual Activity   Alcohol use: No    Alcohol/week: 0.0 standard drinks of alcohol   Drug use: No    Types: Oxycodone   Sexual activity: Not Currently

## 2022-09-27 ENCOUNTER — Encounter: Payer: Self-pay | Admitting: Infectious Disease

## 2022-09-27 ENCOUNTER — Other Ambulatory Visit: Payer: Self-pay

## 2022-09-27 ENCOUNTER — Ambulatory Visit (INDEPENDENT_AMBULATORY_CARE_PROVIDER_SITE_OTHER): Payer: Medicare Other | Admitting: Infectious Disease

## 2022-09-27 VITALS — BP 125/76 | HR 94 | Resp 16 | Ht 76.0 in | Wt 290.0 lb

## 2022-09-27 DIAGNOSIS — L03119 Cellulitis of unspecified part of limb: Secondary | ICD-10-CM | POA: Diagnosis not present

## 2022-09-27 DIAGNOSIS — T827XXD Infection and inflammatory reaction due to other cardiac and vascular devices, implants and grafts, subsequent encounter: Secondary | ICD-10-CM

## 2022-09-27 DIAGNOSIS — I059 Rheumatic mitral valve disease, unspecified: Secondary | ICD-10-CM

## 2022-09-27 DIAGNOSIS — R42 Dizziness and giddiness: Secondary | ICD-10-CM

## 2022-09-27 DIAGNOSIS — R7881 Bacteremia: Secondary | ICD-10-CM

## 2022-09-27 DIAGNOSIS — G609 Hereditary and idiopathic neuropathy, unspecified: Secondary | ICD-10-CM

## 2022-09-27 DIAGNOSIS — B952 Enterococcus as the cause of diseases classified elsewhere: Secondary | ICD-10-CM

## 2022-09-27 DIAGNOSIS — E1140 Type 2 diabetes mellitus with diabetic neuropathy, unspecified: Secondary | ICD-10-CM

## 2022-09-27 DIAGNOSIS — Z89432 Acquired absence of left foot: Secondary | ICD-10-CM

## 2022-09-27 NOTE — Progress Notes (Signed)
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   MRN: [id number]  HPI  82 y.o. male who  initially presented with AMP sensitive enterococcal UTI complicated by enterococcal bacteremia in March 2021 in the setting of AICD and TAVR. TTE and TEE at the time were without evidence of vegetation and was prescribed 2 weeks of ampicillin. On follow up on 4/21 there was worsening of his amputation site with erythema and drainage. He was placed on doxycycline by Dr. Sharol Given which was then changed to Zyvox by me. This resolved his symptoms. On follow up with myself  on 11/26/2019 he was experiencing fevers and dizziness with concern for systemic infection. Blood cultures in the ID clinic were positive for Enterococcus faecalis bacteremia. Surgical site was well healed at that time. 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 Recommended treatment was 6 weeks of ampicillin and ceftriaxone through 6/21. Feeling better on 6/21 and transitioned to life-long suppression with amoxicillin three times daily.    Mr. Getter was then seen on 7/15 /2021 seen by Dr. Johnnye Sima with concern for cellulitis and was continued on his Amoxicillin. He followed up with Dr. Johnnye Sima on 8/6 and continued to do well with Amoxicillin.   Blood cultures no growth but MV had vegetation. TEE was not successful.  He was retreated with ampicillin high-dose and ceftriaxone twice daily.  He completed 6 weeks of therapy and  was then on  high-dose amoxicillin to 1000 g twice daily.  Followed very closely by Dr. Sharol Given for his chronic surgical wound site where he had osteomyelitis in that area is been doing well according to him and also going to Dr. Jess Barters notes.  He was then admitted to [facility] with infection of his  foot having presented with redness and drainage from the left TMA site. Blood cultures from admission were negative. CT of the left foot had cellulitis of the distal forefoot with abscess and acute osteomyelitis of the second third and fourth metatarsal stumps. There was also possible acute osteomyelitis of the first metatarsal stump.  On 1/12 he underwent I&D with revision of transmetatarsal amputation and bone biopsy.  Operative culture grew 1 colony of staph lugdinensis. There was concern for residual osteomyelitis  He was discharged to SNF on 1/22 on cefepime/flagyl with plans to continue through 2/22. He presented back to the ED 1/23 reporting inadequate care at the SNF. He was afebrile with WBC 7,300 and procalcitonin 0.16. He reported some queasiness and metallic taste in his mouth concerning for flagyl adverse events. He was changed to daptomycin and ertapenem to finish his course and sent back to the facility on 07/28/2021  He followed up with Dr. Bonney Roussel. He was also was seen in followup with Dr.Taylor with Podiatry with [facility] and later with Dr. Sharol Given here in [place] who has been happy with his progress. After finishing his  IV antibiotics he went back to chronic high dose suppressive therapy with amoxicillin 1 gram po bid.   [name] was  then admitted the hospital and was treated for possible cellulitis in the context of superimposed venous stasis changes.  He was given Zyvox IV and then a supply of oral Zyvox which was then extended by his primary care physician.   [name] has continued to follow closely Dr. Sharol Given who  has performed local debridements on his foot.  He saw Dr. Sharol Given yet again this month and underwent debridement of soft tissue.  Also recovering from an episode of bronchitis.  He had a low-grade temperature of 99.8 while suffering from this illness.  As mentioned he has had problems with orthostatic hypotension is off all antihypertensive medications at present.      Past  Medical History:  Diagnosis Date   AICD (automatic cardioverter/defibrillator) present    Balanitis    +severe phimosis+ buried penis->circ not possible but dorsal slit done 10/2019   BPH (benign prostatic hypertrophy)    with urinary retention.  Renal u/s 12/04/19 NORMAL KIDNEYS, NORMAL BLADDER, NO HYDRONEPHROSIS   CAD (coronary artery disease)    Nonobstructive by 12/2012 cath; then 03/2016 he required BMS to RCA ([facility]).  In-stent restenosis on cath 11/01/16, baloon angioplasty successful.   CATARACT, HX OF    Chronic combined systolic and diastolic CHF (congestive heart failure) ([place]) 05/2016   Ischemic CM.  04/2018 EF 40-45%, grd I DD.  07/2021 EF 55-60%, AV well seated   Chronic pain syndrome    Lumbar DDD; chronic neuropathic pain (DM); has spinal stimulator and sees pain mgmt MD   Complicated UTI (urinary tract infection) 09/2019   Phimoses, acute urinary retention, entoerococcus UTI, enterococc bacteremia.  F/u blood clx's neg x 5d.     COPD    Debilitated patient    Diabetes mellitus type 2 with complications ([place])    0000000 jumped from 5.7% to 6.1% 03/2015---started metformin at that time.  DM 2 dx by fasting gluc criteria 2018.  Has chronic neuropath pain   Enterococcal bacteremia 09/2019   Phimoses, acute urinary retention, entoerococcus UTI, enterococc bacteremia.  F/u blood clx's neg x 5d.     Essential hypertension, benign    Fatty liver 2007   2007 u/s showed fatty liver with hepatosplenomegaly.  2019 repeat u/s->fatty liver but no cirrhosis or hepatosplenomegaly.   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 Tommy Medal)    Lumbosacral neuritis    Lumbosacral spondylosis    Lumbar spinal stenosis with neurogenic claudication--contributes to his chronic pain syndrome   Morbid obesity ([place])    Normal memory function 08/2014   Neuropsychological testing ([facility]): no cognitive impairment or sign of neurodegenerative disorder.  Likely has adjustment d/o with mixed anxiety/depressed features and may benefit from low dose SNRI.     Normocytic anemia 03/2016   Mild-pt needs ferritin and vit B12 level checked (as of 03/22/16). Hb stable 09/2019.  Hemoccult x 3 NEG 03/2022   NSTEMI (non-ST elevated myocardial infarction) ([place]) 03/20/2016   BMS to RCA   Obesity hypoventilation syndrome (HCC)    Orthostatic hypotension    OSA on CPAP    8 cm H2O   OSTEOARTHRITIS    Paroxysmal atrial fibrillation ([place]) 2003    (? chronic?) Off anticoag for a while due to falls.  Then apixaban started 12/2014.   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 Ella Bodo): neuropathic pain->diff to treat, failed nucynta, failed spinal stimul trial, oxycontin hs + tramadol +  gabap as of 12/2017 f/u Dr. Letta Pate.   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   Sick sinus syndrome (HCC)    PPM placed   Thrombocytopenia ([place])  2018   HSM on 2007 abd u/s---suspect some mild splenic sequestration chronically.   Unspecified glaucoma(365.9)    Unspecified hereditary and idiopathic peripheral neuropathy approx age 55   bilat LE's, ? left arm, too.  Feet became progressively numb + left foot pain intermittently.  Pt may be trying a spinal stimulator (as of 05/2015)   Vaccine counseling 11/16/2021   VENOUS INSUFFICIENCY    Being followed by Dr. Sharol Given as of 10/2016 for two R LL venous stasis ulcers/skin tears.  Healed as of 10/30/16 f/u with Dr. Sharol Given.   Venous stasis dermatitis 12/27/2021   VENTRAL HERNIA     Past Surgical History:  Procedure Laterality Date   AMPUTATION Left 04/11/2013   Procedure: AMPUTATION DIGIT Left 3rd toe;  Surgeon: [name], MD;  Location: [place];  Service: Orthopedics;  Laterality: Left;  Left 3rd toe amputation at [facility]   AMPUTATION Left 08/29/2019   Procedure: LEFT TRANSMETATARSAL AMPUTATION;  Surgeon: [name], MD;  Location: [place];  Service: Orthopedics;  Laterality: Left;   BIOPSY  05/04/2020   Procedure: BIOPSY;  Surgeon: Jackquline Denmark, MD;  Location: [facility] ENDOSCOPY;  Service: Endoscopy;;   [place]; 03/10/16   1997 Non-obstructive disease.  03/2016 BMS to RCA, with 25% pDiag dz, o/w normal cors per cath 03/07/16.  Cath 11/01/16: in stent restenosis, successful baloon angioplasty. 08/2020 branch vessel dz, cont medical therapy rec'd.   CARDIAC CATHETERIZATION  12/24/2012   mild < 20% LCx, prox 30% RCA; LVEF 55-65% , moderate pulmonary HTN, moderate AS   CARDIAC DEFIBRILLATOR PLACEMENT  11/07/2017   [place] MRI Quad CRT defibrillator   CARDIOVASCULAR STRESS TEST  05/11/16 ([facility])   2017 Myocardial perfusion imaging:  No ischemia; scar in apex, global hypokinesis, EF 36%.  06/15/20->mod primarily fixed inferolat wall defect mildly worse with stress c/w infarct/scar with mild peri-infarct ischemia, normal EF-->for cath per cards.   Carotid dopplers  03/09/2016   [facility]: no  hemodynamically significant stenosis on either side.   CHOLECYSTECTOMY     COLONOSCOPY [name] 10/30/2012   Procedure: COLONOSCOPY;  Surgeon: [name], MD;  Location: [facility] ENDOSCOPY;  Service: Endoscopy;  Laterality: [name];   CORONARY ANGIOPLASTY WITH STENT PLACEMENT  03/2016; 04/2017   2017-[facility]: BMS to RCA-pt was placed on Brilinta.  04/2017: DES to RCA.   DORSAL SLIT [name] 10/29/2019   for severe phimosis. Procedure: DORSAL SLIT;  Surgeon: Alexis Frock, MD;  Location: [facility] ORS;  Service: Urology;  Laterality: [name];  [place]  05/04/2020   Procedure: ESOPHAGEAL DILATION;  Surgeon: Jackquline Denmark,  MD;  Location: [place];  Service: Endoscopy;;   ESOPHAGOGASTRODUODENOSCOPY (EGD) WITH PROPOFOL [name] 05/04/2020   Procedure: ESOPHAGOGASTRODUODENOSCOPY (EGD) WITH PROPOFOL;  Surgeon: Jackquline Denmark, MD;  Location: [facility] ENDOSCOPY;  Service: Endoscopy;  Laterality: [name];   EYE SURGERY Bilateral cataract   HEMORRHOID SURGERY     INTRAOCULAR LENS INSERTION Bilateral    KNEE SURGERY Right    LEFT AND RIGHT HEART CATHETERIZATION WITH CORONARY ANGIOGRAM [name] 12/24/2012   Procedure: LEFT AND RIGHT HEART CATHETERIZATION WITH CORONARY ANGIOGRAM;  Surgeon: [name], MD;  Location: [facility] CATH LAB;  Service: Cardiovascular;  Laterality: [name];   LEG SURGERY Bilateral    lenghtening    PACEMAKER PLACEMENT  04/13/2016   2nd deg HB after TAVR, pt had DC MDT PPM placed.   SHOULDER ARTHROSCOPY  08/30/2011   Procedure: ARTHROSCOPY SHOULDER;  Surgeon: [name], MD;  Location: [place];  Service: Orthopedics;  Laterality: Right;  Right Shoulder Arthroscopy, Debridement, and Decompression   SPINAL CORD STIMULATOR INSERTION [name] 09/10/2015   Procedure: LUMBAR SPINAL CORD STIMULATOR INSERTION;  Surgeon: [name], MD;  Location: [place] NEURO ORS;  Service: Neurosurgery;  Laterality: [name];   TEE WITHOUT CARDIOVERSION [name] 09/16/2019   Procedure: TRANSESOPHAGEAL ECHOCARDIOGRAM (TEE);  Surgeon: Skeet Latch, MD;  Location: [facility]COPY;  Service: Cardiovascular;  Laterality: [name];   TEE WITHOUT CARDIOVERSION [name] 12/02/2019   +vegetation on AVR and pacer lead in RV.  EF 55-60%, normal wall motion.  Valves function normal.  Procedure: TRANSESOPHAGEAL ECHOCARDIOGRAM (TEE);  Surgeon: Geralynn Rile, MD;  Location: [place];  Service: Cardiovascular;  Laterality: [name];   TOE AMPUTATION Left    due to osteomyelitis.  R big toe surg due to osteoarth   TONSILLECTOMY     traeculectomy Left    eye   TRANSCATHETER AORTIC VALVE REPLACEMENT, TRANSFEMORAL  04/11/2016   TRANSESOPHAGEAL ECHOCARDIOGRAM  03/09/2016; 09/2019   [facility]: EF 55-60%, PFO seen with bi-directional shunting, no thrombus in appendage.  09/2019 ->no valvular vegetations. Small patent foramen ovale with predominantly left to right shunting across the interatrial septum.   TRANSTHORACIC ECHOCARDIOGRAM  01/2015; 01/2016; 05/18/16; 09/18/16, 05/2017, 08/2017   01/2015 No signif change in aortic stenosis (moderate).  01/2016 Severe LVH w/small LV cavity, EF 60-65%, grade I diast dysfxn.  05/2016 (s/p TAVR): EF 50-55%, grd I DD, biopros AV good.  08/2016--EF 50-55%, LV septal motion c/w conduction abnl, grd I DD,mild MS,bioprosth aortic valve well seated, w/trace AR. 05/2017 TTE EF 35%. 08/2017-EF 35%, mod diff hypokin LV, grd I DD, biopros AV good.    TRANSTHORACIC ECHOCARDIOGRAM  04/2018; 09/2019   04/2018: EF 40-45%, mod diffuse LV hypokin, grd I DD, bioprosth AV well seated, no AS or AR. 09/2019 EF 60-65%, grd I DD, valves fine, including bioprosth AV. 04/29/20 (tech diff) EF 55-60%, grd I DD, vegetation on MV.  07/2021 EF 55-60% (s/p upgrade to BiV PPM), AV well seated.   VITRECTOMY      Family History  Problem Relation Age of Onset   Hypertension Mother    Coronary artery disease Mother    Heart attack Mother    Neuropathy Mother    Pulmonary fibrosis Father        asbestosis      Social History   Socioeconomic History   Marital  status: Married    Spouse name: Not on file   Number of children: 0   Years of education: 20   Highest education level: Not on file  Occupational History  Occupation: Engineer, production    Comment: retired  Tobacco Use   Smoking status: Never   Smokeless tobacco: Never  Vaping Use   Vaping Use: Never used  Substance and Sexual Activity   Alcohol use: No    Alcohol/week: 0.0 standard drinks of alcohol   Drug use: No    Types: Oxycodone   Sexual activity: Not Currently  Other Topics Concern   Not on file  Social History Narrative   HSG, [name] - BS, [name] - MS-engineering, 2 years on PhD - [employer]. Married - '65 - 60yrs/divorced; '76- 3 yrs/divorced; '92 . No children. 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    Lives with wife as of 2020. ACP/Living Will - Yes CPR; long-term Mechanical ventilation as long as he was able to cognate; ok for long term artificial nutrition. Precondition being able to cognate and not to have too much pain.    Social Determinants of Health   Financial Resource Strain: Not on file  Food Insecurity: Not on file  Transportation Needs: Not on file  Physical Activity: Not on file  Stress: Not on file  Social Connections: Not on file    Allergies  Allergen Reactions   Brimonidine Tartrate Shortness Of Breath    Alphagan-Shortness of breath   Brinzolamide Shortness Of Breath    AZOPT- Shortness of breath   Latanoprost Shortness Of Breath    XALATAN- Shortness of breath   Nucynta [Tapentadol] Shortness Of Breath   Sulfa Antibiotics Palpitations   Timolol Maleate Shortness Of Breath and Other (See Comments)    TIMOPTIC- Aggravated asthma   Diltiazem Swelling     leg swelling   Rofecoxib Swelling     VIOXX- leg swelling   Vancomycin Rash and Other (See Comments)    Blisters   Codeine Other (See Comments)    Childhood reaction   Tamsulosin Other (See Comments)    Dizziness    Celecoxib Other (See Comments)    CELLBREX-confusion    Colchicine Diarrhea    diarrhea   Tape Rash     Current Outpatient Medications:    albuterol (VENTOLIN HFA) 108 (90 Base) MCG/ACT inhaler, Inhale 2 puffs into the lungs every 6 (six) hours as needed for wheezing or shortness of breath., Disp: 18 g, Rfl: 0   allopurinol (ZYLOPRIM) 300 MG tablet, Take 1 tablet (300 mg total) by mouth daily. with food, Disp: 90 tablet, Rfl: 1   amoxicillin (AMOXIL) 500 MG capsule, TAKE 2 CAPSULES BY MOUTH TWICE A DAY, Disp: 120 capsule, Rfl: 11   apixaban (ELIQUIS) 5 MG TABS tablet, Take 5 mg by mouth 2 (two) times daily., Disp: , Rfl:    ASPIRIN LOW DOSE 81 MG EC tablet, Take 81 mg by mouth daily., Disp: , Rfl:    B Complex-C (B-COMPLEX WITH VITAMIN C) tablet, Take 1 tablet by mouth daily. Take 1 tablet daily, 250mg , Disp: , Rfl:    ezetimibe (ZETIA) 10 MG tablet, Take 1 tablet (10 mg total) by mouth daily., Disp: 30 tablet, Rfl: 1   ferrous gluconate (FERGON) 324 MG tablet, Take 324 mg by mouth 2 (two) times daily., Disp: , Rfl:    finasteride (PROSCAR) 5 MG tablet, TAKE 1 TABLET BY MOUTH EVERY DAY, Disp: 90 tablet, Rfl: 0   gabapentin (NEURONTIN) 800 MG tablet, TAKE 1 TABLET BY MOUTH FOUR TIMES A DAY, Disp: 120 tablet, Rfl: 0   ipratropium (ATROVENT) 0.03 % nasal spray, 2 SPRAY EACH NOSTRIL EVERY 12 HOURS FOR RUNNY  NOSE, Disp: 30 mL, Rfl: 2   Loratadine (CLARITIN PO), Take 1 tablet by mouth in the morning and at bedtime., Disp: , Rfl:    mometasone-formoterol (DULERA) 100-5 MCG/ACT AERO, Inhale 2 puffs into the lungs as needed for wheezing or shortness of breath., Disp: , Rfl:    Multiple Vitamin (MULTIVITAMIN ADULT PO), Take 1 tablet by mouth daily., Disp: , Rfl:    niacin (NIASPAN) 1000 MG CR tablet, TAKE 1 TABLET BY MOUTH TWICE A DAY, Disp: 180 tablet, Rfl: 3   nitroGLYCERIN (NITROSTAT) 0.4 MG SL tablet, Place 0.4 mg under the tongue every 5 (five) minutes as needed for chest pain. (Patient not taking: Reported on 09/21/2022), Disp: , Rfl:    Oxycodone  HCl 20 MG TABS, 1 tab po bid prn severe pain, Disp: 60 tablet, Rfl: 0   Oxymetazoline HCl (NASAL SPRAY NA), Place into the nose., Disp: , Rfl:    pantoprazole (PROTONIX) 40 MG tablet, Take 1 tablet (40 mg total) by mouth daily., Disp: 180 tablet, Rfl: 1   potassium chloride SA (KLOR-CON M20) 20 MEQ tablet, Take 1 tablet (20 mEq total) by mouth daily., Disp: 90 tablet, Rfl: 1   predniSONE (DELTASONE) 20 MG tablet, 2 tabs po qd x 5d then 1 tab po qd x 5d, Disp: 15 tablet, Rfl: 0   Probiotic Product (ALIGN PO), Take by mouth daily., Disp: , Rfl:    promethazine (PHENERGAN) 12.5 MG tablet, Take 1 tablet (12.5 mg total) by mouth every 8 (eight) hours as needed for nausea or vomiting., Disp: 30 tablet, Rfl: 1   vitamin C (ASCORBIC ACID) 250 MG tablet, Take 250 mg by mouth daily., Disp: , Rfl:    VITAMIN D, CHOLECALCIFEROL, PO, Take 1 tablet by mouth daily. , Disp: , Rfl:    WIXELA INHUB 250-50 MCG/ACT AEPB, INHALE 1 PUFF BY MOUTH TWICE A DAY, Disp: 60 each, Rfl: 5   Review of Systems  Constitutional:  Negative for activity change, appetite change, chills, diaphoresis, fatigue, fever and unexpected weight change.  HENT:  Negative for congestion, rhinorrhea, sinus pressure, sneezing, sore throat and trouble swallowing.   Eyes:  Negative for photophobia and visual disturbance.  Respiratory:  Negative for cough, chest tightness, shortness of breath, wheezing and stridor.   Cardiovascular:  Negative for chest pain, palpitations and leg swelling.  Gastrointestinal:  Negative for abdominal distention, abdominal pain, anal bleeding, blood in stool, constipation, diarrhea, nausea and vomiting.  Genitourinary:  Negative for difficulty urinating, dysuria, flank pain and hematuria.  Musculoskeletal:  Negative for arthralgias, back pain, gait problem, joint swelling and myalgias.  Skin:  Positive for wound. Negative for color change, pallor and rash.  Neurological:  Positive for dizziness, weakness and  light-headedness. Negative for tremors.  Hematological:  Negative for adenopathy. Does not bruise/bleed easily.  Psychiatric/Behavioral:  Negative for agitation, behavioral problems, confusion, decreased concentration, dysphoric mood and sleep disturbance.        Objective:   Physical Exam Constitutional:      Appearance: He is well-developed.  HENT:     Head: Normocephalic and atraumatic.  Eyes:     Conjunctiva/sclera: Conjunctivae normal.  Cardiovascular:     Rate and Rhythm: Normal rate and regular rhythm.  Pulmonary:     Effort: Pulmonary effort is normal. No respiratory distress.     Breath sounds: No wheezing.  Abdominal:     General: There is no distension.     Palpations: Abdomen is soft.  Musculoskeletal:  General: No tenderness. Normal range of motion.     Cervical back: Normal range of motion and neck supple.  Skin:    General: Skin is warm and dry.     Coloration: Skin is not pale.     Findings: No erythema or rash.  Neurological:     General: No focal deficit present.     Mental Status: He is alert and oriented to person, place, and time.  Psychiatric:        Mood and Affect: Mood normal.        Behavior: Behavior normal.        Thought Content: Thought content normal.        Judgment: Judgment normal.       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 Sharol Given    For static hypotension: Certainly we can always worry about him having a  bloodstream infection though I do not think that is what is going on right now. We do need to keep in mind that he broke through amoxicillin in the past albeit at a lower dose.

## 2022-10-02 ENCOUNTER — Encounter: Payer: Self-pay | Admitting: Family Medicine

## 2022-10-02 ENCOUNTER — Other Ambulatory Visit: Payer: Self-pay | Admitting: Family Medicine

## 2022-10-02 LAB — HEMOCCULT SLIDES (X 3 CARDS)
Fecal Occult Blood: NEGATIVE
OCCULT 1: NEGATIVE
OCCULT 2: NEGATIVE
OCCULT 3: NEGATIVE
OCCULT 4: NEGATIVE
OCCULT 5: NEGATIVE

## 2022-10-24 ENCOUNTER — Other Ambulatory Visit: Payer: Self-pay | Admitting: Family Medicine

## 2022-11-20 ENCOUNTER — Other Ambulatory Visit: Payer: Self-pay | Admitting: Family Medicine

## 2022-12-03 ENCOUNTER — Other Ambulatory Visit: Payer: Self-pay | Admitting: Family Medicine

## 2022-12-07 ENCOUNTER — Other Ambulatory Visit: Payer: Self-pay | Admitting: Family Medicine

## 2022-12-07 NOTE — Telephone Encounter (Signed)
Pt has appt tomorrow

## 2022-12-08 ENCOUNTER — Encounter: Payer: Self-pay | Admitting: Family Medicine

## 2022-12-08 ENCOUNTER — Ambulatory Visit: Payer: Medicare Other | Admitting: Family Medicine

## 2022-12-08 VITALS — BP 119/73 | HR 94 | Temp 98.0°F | Wt 289.0 lb

## 2022-12-08 DIAGNOSIS — E876 Hypokalemia: Secondary | ICD-10-CM

## 2022-12-08 DIAGNOSIS — R600 Localized edema: Secondary | ICD-10-CM

## 2022-12-08 DIAGNOSIS — R7401 Elevation of levels of liver transaminase levels: Secondary | ICD-10-CM

## 2022-12-08 DIAGNOSIS — D509 Iron deficiency anemia, unspecified: Secondary | ICD-10-CM

## 2022-12-08 DIAGNOSIS — E1140 Type 2 diabetes mellitus with diabetic neuropathy, unspecified: Secondary | ICD-10-CM

## 2022-12-08 LAB — POCT GLYCOSYLATED HEMOGLOBIN (HGB A1C)
HbA1c POC (<> result, manual entry): 5.5 % (ref 4.0–5.6)
HbA1c, POC (controlled diabetic range): 5.5 % (ref 0.0–7.0)
HbA1c, POC (prediabetic range): 5.5 % — AB (ref 5.7–6.4)
Hemoglobin A1C: 5.5 % (ref 4.0–5.6)

## 2022-12-08 MED ORDER — PROMETHAZINE HCL 12.5 MG PO TABS: 12.5000 mg | ORAL_TABLET | Freq: Three times a day (TID) | ORAL | 1 refills | Status: DC | PRN

## 2022-12-08 MED ORDER — FERROUS GLUCONATE 324 (38 FE) MG PO TABS: 324.0000 mg | ORAL_TABLET | Freq: Two times a day (BID) | ORAL | 1 refills | Status: DC

## 2022-12-08 MED ORDER — GABAPENTIN 800 MG PO TABS: 800.0000 mg | ORAL_TABLET | Freq: Four times a day (QID) | ORAL | 1 refills | Status: DC

## 2022-12-08 MED ORDER — POTASSIUM CHLORIDE CRYS ER 20 MEQ PO TBCR: 20.0000 meq | EXTENDED_RELEASE_TABLET | Freq: Every day | ORAL | 1 refills | Status: DC

## 2022-12-08 MED ORDER — FINASTERIDE 5 MG PO TABS: 5.0000 mg | ORAL_TABLET | Freq: Every day | ORAL | 1 refills | Status: DC

## 2022-12-08 NOTE — Progress Notes (Signed)
OFFICE VISIT  12/08/2022  CC:  Chief Complaint  Patient presents with   Diabetes    Patient is a 82 y.o. male who presents for 25-month follow-up diabetes, anemia, and chronic bilateral lower extremity edema.  INTERIM HX: Was having palpitations so he went to the emergency department on 12/01/2022.  Ruled out for MI.  Palpitations had resolved by the time he got evaluated. His potassium was 3.2 so he was prescribed a 10 mill equivalent dose of potassium to take in addition to his usual 20 mill equivalent dose for 5 days.  He was confused about this so he has not done the extra 10 mill equivalent dosing.  His hemoglobin on labs that day had come up to 10.9.  He has continued to take 2 iron tabs a day.  AST was 165 and ALT was 80. He has some waxing and waning nausea and general GI upset as well as alternating constipation and diarrhea.  Recent follow-up with gastroenterology on 12/04/2022--> they are looking further into his elevated transaminases with a autoimmune panel, acute viral hepatitis panel, and a liver ultrasound.    Legs have not been swollen much at all lately. He does take torsemide every morning but this is not on his medication list.  ROS as above, plus--> no fevers, no CP, no SOB, no wheezing, no cough, no dizziness, no HAs, no rashes, no melena/hematochezia.  No polyuria or polydipsia.  No myalgias or arthralgias.  No focal weakness, paresthesias, or tremors.  No acute vision or hearing abnormalities.  No dysuria or unusual/new urinary urgency or frequency.  No recent changes in lower legs.  He has chronic bilateral arm edema.  Past Medical History:  Diagnosis Date   AICD (automatic cardioverter/defibrillator) present    Balanitis    +severe phimosis+ buried penis->circ not possible but dorsal slit done 10/2019   BPH (benign prostatic hypertrophy)    with urinary retention.  Renal u/s 12/04/19 NORMAL KIDNEYS, NORMAL BLADDER, NO HYDRONEPHROSIS   CAD (coronary artery disease)     Nonobstructive by 12/2012 cath; then 03/2016 he required BMS to RCA ([facility]).  In-stent restenosis on cath 11/01/16, baloon angioplasty successful.   CATARACT, HX OF    Chronic combined systolic and diastolic CHF (congestive heart failure) (HCC) 05/2016   Ischemic CM.  04/2018 EF 40-45%, grd I DD.  07/2021 EF 55-60%, AV well seated   Chronic pain syndrome    Lumbar DDD; chronic neuropathic pain (DM); has spinal stimulator and sees pain mgmt MD   Complicated UTI (urinary tract infection) 09/2019   Phimoses, acute urinary retention, entoerococcus UTI, enterococc bacteremia.  F/u blood clx's neg x 5d.     COPD    Debilitated patient    Diabetes mellitus type 2 with complications (HCC)    HbA1c jumped from 5.7% to 6.1% 03/2015---started metformin at that time.  DM 2 dx by fasting gluc criteria 2018.  Has chronic neuropath pain   Enterococcal bacteremia 09/2019   Phimoses, acute urinary retention, entoerococcus UTI, enterococc bacteremia.  F/u blood clx's neg x 5d.     Essential hypertension, benign    Fatty liver 2007   2007 u/s showed fatty liver with hepatosplenomegaly.  2019 repeat u/s->fatty liver but no cirrhosis or hepatosplenomegaly.   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 Daiva Eves)   Iron deficiency anemia    09/2022.  Hemoccults x 3 neg Mar 2024.  Improving with oral iron. GI following, no endoscopies   Lumbosacral neuritis    Lumbosacral spondylosis    Lumbar spinal stenosis with neurogenic claudication--contributes to his chronic pain syndrome   Morbid obesity (HCC)    Normal memory function 08/2014   Neuropsychological  testing ([facility]): no cognitive impairment or sign of neurodegenerative disorder.  Likely has adjustment d/o with mixed anxiety/depressed features and may benefit from low dose SNRI.     Normocytic anemia 03/2016   Mild-pt needs ferritin and vit B12 level checked (as of 03/22/16). Hb stable 09/2019.  Hemoccult x 3 NEG 03/2022.  Hemoccults NEG x 03 Sep 2022   NSTEMI (non-ST elevated myocardial infarction) (HCC) 03/20/2016   BMS to RCA   Obesity hypoventilation syndrome (HCC)    Orthostatic hypotension    OSA on CPAP    8 cm H2O   OSTEOARTHRITIS    Paroxysmal atrial fibrillation (HCC) 2003    (? chronic?) Off anticoag for a while due to falls.  Then apixaban started 12/2014.   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 Larna Daughters): neuropathic pain->diff to treat, failed nucynta, failed spinal stimul trial, oxycontin hs + tramadol + gabap as of 12/2017 f/u Dr. Wynn Banker.   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   Sick sinus syndrome (HCC)    PPM placed   Thrombocytopenia (HCC) 2018   HSM on 2007 abd u/s---suspect some mild splenic sequestration chronically.   Unspecified glaucoma(365.9)    Unspecified hereditary and idiopathic peripheral neuropathy approx age 37   bilat  LE's, ? left arm, too.  Feet became progressively numb + left foot pain intermittently.  Pt may be trying a spinal stimulator (as of 05/2015)   Vaccine counseling 11/16/2021   VENOUS INSUFFICIENCY    Being followed by Dr. Lajoyce Corners as of 10/2016 for two R LL venous stasis ulcers/skin tears.  Healed as of 10/30/16 f/u with Dr. Lajoyce Corners.   Venous stasis dermatitis 12/27/2021   VENTRAL HERNIA     Past Surgical History:  Procedure Laterality Date   AMPUTATION Left 04/11/2013   Procedure: AMPUTATION DIGIT Left 3rd toe;  Surgeon: Nadara Mustard, MD;  Location: [facility] OR;  Service: Orthopedics;  Laterality: Left;  Left 3rd toe amputation at [facility]   AMPUTATION Left 08/29/2019   Procedure: LEFT TRANSMETATARSAL AMPUTATION;  Surgeon: Nadara Mustard, MD;  Location: [facility] OR;  Service: Orthopedics;  Laterality: Left;   BIOPSY  05/04/2020   Procedure: BIOPSY;  Surgeon: Lynann Bologna, MD;  Location: [facility] ENDOSCOPY;  Service: Endoscopy;;   CARDIAC CATHETERIZATION  1997; 03/10/16   1997 Non-obstructive disease.  03/2016 BMS to RCA, with 25% pDiag dz, o/w normal cors per cath 03/07/16.  Cath 11/01/16: in stent restenosis, successful baloon angioplasty. 08/2020 branch vessel dz, cont medical therapy rec'd.   CARDIAC CATHETERIZATION  12/24/2012   mild < 20% LCx, prox 30% RCA; LVEF 55-65% , moderate pulmonary HTN, moderate AS   CARDIAC DEFIBRILLATOR PLACEMENT  11/07/2017   [facility] MRI Quad CRT defibrillator   CARDIOVASCULAR STRESS TEST  05/11/16 ([facility])   2017 Myocardial perfusion imaging:  No ischemia; scar in apex, global hypokinesis, EF 36%.  06/15/20->mod primarily fixed inferolat wall defect mildly worse with stress c/w infarct/scar with mild peri-infarct ischemia, normal EF-->for cath per cards.   Carotid dopplers  03/09/2016   [facility]: no hemodynamically significant stenosis on either side.   CHOLECYSTECTOMY     COLONOSCOPY [name] 10/30/2012   Procedure: COLONOSCOPY;  Surgeon: Iva Boop, MD;  Location: [facility] ENDOSCOPY;  Service:  Endoscopy;  Laterality: [name];   CORONARY ANGIOPLASTY WITH STENT PLACEMENT  03/2016; 04/2017   2017-[facility]: BMS to RCA-pt was placed on Brilinta.  04/2017: DES to RCA.   DORSAL SLIT [name] 10/29/2019   for severe phimosis. Procedure: DORSAL SLIT;  Surgeon: Sebastian Ache, MD;  Location: [facility] ORS;  Service: Urology;  Laterality: [name];  45 MINS   ESOPHAGEAL DILATION  05/04/2020   Procedure: ESOPHAGEAL DILATION;  Surgeon: Lynann Bologna, MD;  Location: [name] ENDOSCOPY;  Service: Endoscopy;;   ESOPHAGOGASTRODUODENOSCOPY (EGD) WITH PROPOFOL [name] 05/04/2020   Procedure: ESOPHAGOGASTRODUODENOSCOPY (EGD) WITH PROPOFOL;  Surgeon: Lynann Bologna, MD;  Location: [facility] ENDOSCOPY;  Service: Endoscopy;  Laterality: [name];   EYE SURGERY Bilateral cataract   HEMORRHOID SURGERY     INTRAOCULAR LENS INSERTION Bilateral    KNEE SURGERY Right    LEFT AND RIGHT HEART CATHETERIZATION WITH CORONARY ANGIOGRAM [name] 12/24/2012   Procedure: LEFT AND RIGHT HEART CATHETERIZATION WITH CORONARY ANGIOGRAM;  Surgeon: Peter M Swaziland, MD;  Location: [name] CATH LAB;  Service: Cardiovascular;  Laterality: [name];   LEG SURGERY Bilateral    lenghtening    PACEMAKER PLACEMENT  04/13/2016   2nd deg HB after TAVR, pt had DC MDT PPM placed.   SHOULDER ARTHROSCOPY  08/30/2011   Procedure: ARTHROSCOPY SHOULDER;  Surgeon: Nadara Mustard, MD;  Location: [facility] OR;  Service: Orthopedics;  Laterality: Right;  Right Shoulder Arthroscopy, Debridement, and Decompression   SPINAL CORD STIMULATOR INSERTION [name] 09/10/2015   Procedure: LUMBAR SPINAL CORD STIMULATOR INSERTION;  Surgeon: Odette Fraction, MD;  Location: [facility] NEURO ORS;  Service: Neurosurgery;  Laterality: [name];   TEE WITHOUT CARDIOVERSION [name] 09/16/2019   Procedure: TRANSESOPHAGEAL ECHOCARDIOGRAM (TEE);  Surgeon: Chilton Si, MD;  Location: [name] ENDOSCOPY;  Service: Cardiovascular;  Laterality: [name];   TEE WITHOUT CARDIOVERSION [name] 12/02/2019   +vegetation on AVR and pacer lead in RV.  EF 55-60%, normal wall  motion.  Valves function normal.  Procedure: TRANSESOPHAGEAL ECHOCARDIOGRAM (TEE);  Surgeon: Sande Rives, MD;  Location: [facility] ENDOSCOPY;  Service: Cardiovascular;  Laterality: [name];   TOE AMPUTATION Left    due to osteomyelitis.  R big toe surg due to osteoarth   TONSILLECTOMY     traeculectomy Left    eye   TRANSCATHETER AORTIC VALVE REPLACEMENT, TRANSFEMORAL  04/11/2016   TRANSESOPHAGEAL ECHOCARDIOGRAM  03/09/2016; 09/2019   [facility]: EF 55-60%, PFO seen with bi-directional shunting, no thrombus in appendage.  09/2019 ->no valvular vegetations. Small patent foramen ovale with predominantly  left to right shunting across the interatrial septum.   TRANSTHORACIC ECHOCARDIOGRAM  01/2015; 01/2016; 05/18/16; 09/18/16, 05/2017, 08/2017   01/2015 No signif change in aortic stenosis (moderate).  01/2016 Severe LVH w/small LV cavity, EF 60-65%, grade I diast dysfxn.  05/2016 (s/p TAVR): EF 50-55%, grd I DD, biopros AV good.  08/2016--EF 50-55%, LV septal motion c/w conduction abnl, grd I DD,mild MS,bioprosth aortic valve well seated, w/trace AR. 05/2017 TTE EF 35%. 08/2017-EF 35%, mod diff hypokin LV, grd I DD, biopros AV good.    TRANSTHORACIC ECHOCARDIOGRAM  04/2018; 09/2019   04/2018: EF 40-45%, mod diffuse LV hypokin, grd I DD, bioprosth AV well seated, no AS or AR. 09/2019 EF 60-65%, grd I DD, valves fine, including bioprosth AV. 04/29/20 (tech diff) EF 55-60%, grd I DD, vegetation on MV.  07/2021 EF 55-60% (s/p upgrade to BiV PPM), AV well seated.   VITRECTOMY      Outpatient Medications Prior to Visit  Medication Sig Dispense Refill   albuterol (VENTOLIN HFA) 108 (90 Base) MCG/ACT inhaler Inhale 2 puffs into the lungs every 6 (six) hours as needed for wheezing or shortness of breath. 18 g 0   allopurinol (ZYLOPRIM) 300 MG tablet TAKE 1 TABLET (300 MG TOTAL) BY MOUTH DAILY. WITH FOOD 90 tablet 1   amoxicillin (AMOXIL) 500 MG capsule TAKE 2 CAPSULES BY MOUTH TWICE A DAY 120 capsule 11   apixaban (ELIQUIS) 5  MG TABS tablet Take 5 mg by mouth 2 (two) times daily.     ASPIRIN LOW DOSE 81 MG EC tablet Take 81 mg by mouth daily.     B Complex-C (B-COMPLEX WITH VITAMIN C) tablet Take 1 tablet by mouth daily. Take 1 tablet daily, 250mg      ezetimibe (ZETIA) 10 MG tablet Take 1 tablet (10 mg total) by mouth daily. 30 tablet 1   fluticasone-salmeterol (WIXELA INHUB) 250-50 MCG/ACT AEPB TAKE 1 PUFF BY MOUTH TWICE A DAY 60 each 2   Loratadine (CLARITIN PO) Take 1 tablet by mouth in the morning and at bedtime.     mometasone-formoterol (DULERA) 100-5 MCG/ACT AERO Inhale 2 puffs into the lungs as needed for wheezing or shortness of breath.     Multiple Vitamin (MULTIVITAMIN ADULT PO) Take 1 tablet by mouth daily.     niacin (NIASPAN) 1000 MG CR tablet TAKE 1 TABLET BY MOUTH TWICE A DAY 180 tablet 3   nitroGLYCERIN (NITROSTAT) 0.4 MG SL tablet Place 0.4 mg under the tongue every 5 (five) minutes as needed for chest pain.     Oxycodone HCl 20 MG TABS 1 tab po bid prn severe pain 60 tablet 0   Oxymetazoline HCl (NASAL SPRAY NA) Place into the nose.     pantoprazole (PROTONIX) 40 MG tablet Take 1 tablet (40 mg total) by mouth daily. (Patient taking differently: Take 40 mg by mouth 2 (two) times daily.) 180 tablet 1   Probiotic Product (ALIGN PO) Take by mouth daily.     rosuvastatin (CRESTOR) 20 MG tablet Take 40 mg by mouth.     vitamin C (ASCORBIC ACID) 250 MG tablet Take 250 mg by mouth daily.     VITAMIN D, CHOLECALCIFEROL, PO Take 1 tablet by mouth daily.      ferrous gluconate (FERGON) 324 MG tablet Take 324 mg by mouth 2 (two) times daily.     ipratropium (ATROVENT) 0.03 % nasal spray 2 SPRAY EACH NOSTRIL EVERY 12 HOURS FOR RUNNY NOSE (Patient not taking: Reported on 12/08/2022) 30 mL  2   finasteride (PROSCAR) 5 MG tablet TAKE 1 TABLET BY MOUTH EVERY DAY 90 tablet 0   gabapentin (NEURONTIN) 800 MG tablet TAKE 1 TABLET BY MOUTH FOUR TIMES A DAY 120 tablet 1   potassium chloride SA (KLOR-CON M20) 20 MEQ tablet  Take 1 tablet (20 mEq total) by mouth daily. 90 tablet 1   predniSONE (DELTASONE) 20 MG tablet 2 tabs po qd x 5d then 1 tab po qd x 5d (Patient not taking: Reported on 09/27/2022) 15 tablet 0   promethazine (PHENERGAN) 12.5 MG tablet Take 1 tablet (12.5 mg total) by mouth every 8 (eight) hours as needed for nausea or vomiting. (Patient not taking: Reported on 12/08/2022) 30 tablet 1   No facility-administered medications prior to visit.    Allergies  Allergen Reactions   Brimonidine Tartrate Shortness Of Breath    Alphagan-Shortness of breath   Brinzolamide Shortness Of Breath    AZOPT- Shortness of breath   Latanoprost Shortness Of Breath    XALATAN- Shortness of breath   Nucynta [Tapentadol] Shortness Of Breath   Sulfa Antibiotics Palpitations   Timolol Maleate Shortness Of Breath and Other (See Comments)    TIMOPTIC- Aggravated asthma   Diltiazem Swelling     leg swelling   Rofecoxib Swelling     VIOXX- leg swelling   Vancomycin Rash and Other (See Comments)    Blisters   Codeine Other (See Comments)    Childhood reaction   Tamsulosin Other (See Comments)    Dizziness    Celecoxib Other (See Comments)    CELLBREX-confusion   Colchicine Diarrhea    diarrhea   Tape Rash    Review of Systems As per HPI  PE:    12/08/2022    3:21 PM 09/27/2022    4:13 PM 09/21/2022    3:00 PM  Vitals with BMI  Height  6\' 4"    Weight 289 lbs 290 lbs   BMI  35.31   Systolic 119 125 098  Diastolic 73 76 78  Pulse 94 94      Physical Exam  NAD, pleasant CV: RRR, no m/r/g.   LUNGS: CTA bilat, nonlabored resps, good aeration in all lung fields.   LABS:  Last CBC Lab Results  Component Value Date   WBC 6.7 09/21/2022   HGB 10.2 (L) 09/21/2022   HCT 31.6 (L) 09/21/2022   MCV 88.4 09/21/2022   MCH 30.9 02/10/2022   RDW 17.9 (H) 09/21/2022   PLT 204.0 09/21/2022   Lab Results  Component Value Date   IRON 218 (H) 09/21/2022   TIBC 285 09/21/2022   FERRITIN 12 (L) 09/21/2022    Last metabolic panel Lab Results  Component Value Date   GLUCOSE 175 (H) 09/21/2022   NA 142 09/21/2022   K 4.1 09/21/2022   CL 105 09/21/2022   CO2 28 09/21/2022   BUN 15 09/21/2022   CREATININE 0.76 09/21/2022   GFRNONAA >60 12/15/2021   CALCIUM 8.6 09/21/2022   PROT 6.1 02/10/2022   ALBUMIN 2.8 (L) 12/15/2021   BILITOT 0.6 02/10/2022   ALKPHOS 49 12/15/2021   AST 24 02/10/2022   ALT 17 02/10/2022   ANIONGAP 10 12/15/2021   Last lipids Lab Results  Component Value Date   CHOL 115 02/10/2022   HDL 58 02/10/2022   LDLCALC 43 02/10/2022   TRIG 65 02/10/2022   CHOLHDL 2.0 02/10/2022   Last hemoglobin A1c Lab Results  Component Value Date   HGBA1C 6.0 (A) 09/01/2022  HGBA1C 6.0 09/01/2022   HGBA1C 6.0 09/01/2022   HGBA1C 6.0 09/01/2022   Last thyroid functions Lab Results  Component Value Date   TSH 0.905 04/29/2020   T3TOTAL 89 09/16/2018   Last vitamin B12 and Folate Lab Results  Component Value Date   VITAMINB12 429 05/24/2016   IMPRESSION AND PLAN:  #1 diabetes with peripheral neuropathy. Great control on diet alone.  Hemoglobin A1c today is 5.5%.  2.  Iron deficiency anemia.  Suspect malabsorption, possibly due to use of high-dose PPI chronically. Hemoccults have been negative. GI saw him and felt like endoscopy not necessary at this time. He has been on iron twice a day for the last several months.  Hemoglobin most recently 10.9 a week ago. Check iron panel today.  3.  Chronic bilateral lower extremity edema: Multifactorial  (venous insufficiency/lymphedema, pulmonary hypertension, chronic congestive heart failure, immobility).  this has been stable lately. Continue torsemide daily.  4.  Hypokalemia: Noted on emergency department labs 12/01/2022. Recheck potassium today.  5.  Elevated transaminases: AST was 165 and ALT 80 one week ago in the ED. Alkaline phosphatase was 95, total bilirubin 0.46, and albumin 2.7. Per GI, will do lab workup.   He has an ultrasound of his liver scheduled. [name] has a lot of trouble getting out of his house so we chose to get his labs done here today and we will forward results to his gastroenterology provider.  An After Visit Summary was printed and given to the patient.  FOLLOW UP: Return in about 3 months (around 03/10/2023) for routine chronic illness f/u.  Signed:  Santiago Bumpers, MD           12/08/2022

## 2022-12-09 LAB — IRON,TIBC AND FERRITIN PANEL: Ferritin: 20 ng/mL — ABNORMAL LOW (ref 24–380)

## 2022-12-09 LAB — COMPREHENSIVE METABOLIC PANEL
BUN: 10 mg/dL (ref 7–25)
Creat: 0.67 mg/dL — ABNORMAL LOW (ref 0.70–1.22)
Sodium: 143 mmol/L (ref 135–146)

## 2022-12-10 LAB — IRON,TIBC AND FERRITIN PANEL: Iron: 46 ug/dL — ABNORMAL LOW (ref 50–180)

## 2022-12-10 LAB — COMPREHENSIVE METABOLIC PANEL
AST: 41 U/L — ABNORMAL HIGH (ref 10–35)
Alkaline phosphatase (APISO): 68 U/L (ref 35–144)
Potassium: 4.1 mmol/L (ref 3.5–5.3)
Total Bilirubin: 0.6 mg/dL (ref 0.2–1.2)
Total Protein: 5 g/dL — ABNORMAL LOW (ref 6.1–8.1)

## 2022-12-10 LAB — HEPATITIS PANEL, ACUTE: Hep B C IgM: NONREACTIVE

## 2022-12-10 LAB — ANA SCREEN,IFA,REFLEX TITER/PATTERN,REFLEX MPLX 11 AB CASCADE: Anti Nuclear Antibody (ANA): NEGATIVE

## 2022-12-11 LAB — COMPREHENSIVE METABOLIC PANEL
ALT: 28 U/L (ref 9–46)
BUN/Creatinine Ratio: 15 (calc) (ref 6–22)
Calcium: 8.2 mg/dL — ABNORMAL LOW (ref 8.6–10.3)
Chloride: 110 mmol/L (ref 98–110)

## 2022-12-11 LAB — ANA SCREEN,IFA,REFLEX TITER/PATTERN,REFLEX MPLX 11 AB CASCADE: Rheumatoid fact SerPl-aCnc: <10 IU/mL (ref <14)

## 2022-12-13 ENCOUNTER — Ambulatory Visit (INDEPENDENT_AMBULATORY_CARE_PROVIDER_SITE_OTHER): Payer: Medicare Other | Admitting: Family

## 2022-12-13 ENCOUNTER — Encounter: Payer: Self-pay | Admitting: Family

## 2022-12-13 DIAGNOSIS — L03115 Cellulitis of right lower limb: Secondary | ICD-10-CM

## 2022-12-13 MED ORDER — CEPHALEXIN 500 MG PO CAPS: 500.0000 mg | ORAL_CAPSULE | Freq: Three times a day (TID) | ORAL | 0 refills | Status: DC

## 2022-12-13 NOTE — Progress Notes (Signed)
Office Visit Note   Patient: Tim [name]           Date of Birth: 01/24/1941           MRN: [id number] Visit Date: 12/13/2022              Requested by: Jeoffrey Massed, MD [address] [place],  [place] [address] PCP: Jeoffrey Massed, MD  No chief complaint on file.     HPI: The patient is an 82 year old gentleman who is well-known to our office who presents today for new edema erythema and drainage to the right lower extremity.  He has not previously had issues with his right lower leg.  History significant for COPD, CHF, status post TAVR, aortic stenosis, endocarditis of prosthetic valve and ICD as well as diabetes.  He is on a gram of amoxicillin twice daily chronically.  Denies chills or fevers  Assessment & Plan: Visit Diagnoses: No diagnosis found.  Plan: On exam concern for cellulitis will add cephalexin.  He will continue his amoxicillin.  He will follow-up in 1 week.  We have applied a Dynaflex compression to the right lower extremity discussed return precautions  Follow-Up Instructions: No follow-ups on file.   Ortho Exam  [name], oriented, no adenopathy, well-dressed, normal affect, normal respiratory effort. On examination of right lower extremity he has significant edema with hemosiderin staining scattered ulcerations and dermatitis.  There is pitting edema with mild warmth mild erythema  Imaging: No results found. No images are attached to the encounter.  Labs: Lab Results  Component Value Date   HGBA1C 5.5 12/08/2022   HGBA1C 5.5 12/08/2022   HGBA1C 5.5 (A) 12/08/2022   HGBA1C 5.5 12/08/2022   ESRSEDRATE 30 (H) 12/15/2021   ESRSEDRATE 34 (H) 11/24/2020   ESRSEDRATE 38 (H) 07/14/2020   CRP 1.3 11/24/2020   CRP 0.9 07/14/2020   CRP 0.7 05/03/2020   LABURIC 3.5 (L) 09/10/2013   REPTSTATUS 12/15/2021 FINAL 12/14/2021   CULT  12/14/2021    NO GROWTH Performed at [facility], [address], [place], [place] [address]     [facility] ENTEROCOCCUS FAECALIS 09/11/2019     Lab Results  Component Value Date   ALBUMIN 2.8 (L) 12/15/2021   ALBUMIN 3.2 (L) 12/14/2021   ALBUMIN 3.4 (L) 10/13/2021    Lab Results  Component Value Date   MG 1.7 05/07/2020   MG 1.6 (L) 05/06/2020   MG 1.8 05/05/2020   No results found for: "VD25OH"  No results found for: "PREALBUMIN"    Latest Ref Rng & Units 09/21/2022    3:22 PM 02/10/2022    4:15 PM 12/27/2021    9:35 AM  CBC EXTENDED  WBC 4.0 - 10.5 K/uL 6.7  5.1  4.7   RBC 4.22 - 5.81 Mil/uL 3.57  3.66  3.52   Hemoglobin 13.0 - 17.0 g/dL 32.4  40.1  02.7   HCT 39.0 - 52.0 % 31.6  33.7  32.9   Platelets 150.0 - 400.0 K/uL 204.0  124  76   NEUT# 1,500 - 7,800 cells/uL   2,355   Lymph# 850 - 3,900 cells/uL   1,612      There is no height or weight on file to calculate BMI.  Orders:  No orders of the defined types were placed in this encounter.  Meds ordered this encounter  Medications   cephALEXin (KEFLEX) 500 MG capsule    Sig: Take 1 capsule (500  mg total) by mouth 3 (three) times daily.    Dispense:  30 capsule    Refill:  0     Procedures: No procedures performed  Clinical Data: No additional findings.  ROS:  All other systems negative, except as noted in the HPI. Review of Systems  Objective: Vital Signs: There were no vitals taken for this visit.  Specialty Comments:  No specialty comments available.  PMFS History: Patient Active Problem List   Diagnosis Date Noted   Venous stasis dermatitis 12/27/2021   Cellulitis 12/14/2021   Wound of right lower extremity 12/14/2021   Transient hypotension 12/14/2021   Heart failure with preserved ejection fraction (HCC) 12/14/2021   Prediabetes 12/14/2021   Thrombocytopenia (HCC) 12/14/2021   Obesity, Class III, BMI 40-49.9 (morbid obesity) (HCC) 12/14/2021   BPH (benign prostatic hyperplasia) 12/14/2021   Vaccine counseling 11/16/2021   Weakness 02/23/2021   Physical deconditioning  02/23/2021   Rhinitis 07/14/2020   Mitral valve vegetation 05/28/2020   Non-pressure chronic ulcer of other part of left foot limited to breakdown of skin (HCC)    Esophageal dysphagia    Bacteremia    Endocarditis of mitral valve    Fever 04/28/2020   Cervical lymphadenopathy 03/15/2020   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   ICD (implantable cardioverter-defibrillator) infection (HCC) 12/22/2019   Gram-positive bacteremia 11/27/2019   FUO (fever of unknown origin) 11/26/2019   Low blood pressure reading 11/05/2019   Wound dehiscence 10/22/2019   PFO (patent foramen ovale) 10/06/2019   ICD (implantable cardioverter-defibrillator) battery depletion    Pressure injury of skin 09/13/2019   Enterococcal bacteremia history  09/13/2019   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   Urinary retention 09/13/2019   Phimosis 09/13/2019   SIRS (systemic inflammatory response syndrome) (HCC) 09/12/2019   Sepsis (HCC) 09/12/2019   Enterococcus UTI 09/12/2019   Cellulitis of foot    History of transmetatarsal amputation of left foot (HCC)    Subacute osteomyelitis, left ankle and foot (HCC)    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) 04/17/2018   Diabetic neuropathy, painful (HCC) 12/26/2016   Neuropathic pain of foot, left 06/20/2016   Insulin resistance 07/22/2015   Status post amputation of left great toe (HCC) 06/08/2015   Spinal stenosis of lumbar region 02/16/2015   Hereditary and idiopathic peripheral neuropathy 01/12/2015   Oral thrush 12/28/2014   Orthostatic dizziness 05/24/2014   Paresthesias with subjective weakness 01/30/2014   Bilateral thigh pain 01/30/2014   Abdominal wall pain in right upper quadrant 01/22/2014   Gross hematuria 09/25/2013   Intertrigo 09/25/2013   IBS (irritable bowel syndrome) 09/11/2013   Chronic  pain syndrome 08/06/2013   CAD (coronary artery disease), native coronary artery 12/25/2012   Paroxysmal atrial fibrillation (HCC) 12/25/2012   Moderate aortic stenosis 12/25/2012   Aortic stenosis 12/24/2012   Personal history of colonic adenoma 10/30/2012   Diverticulosis of colon (without mention of hemorrhage) 10/30/2012   Internal hemorrhoids 10/30/2012   Change in bowel habits intermittent loose stools 10/30/2012   Routine health maintenance 06/09/2012   Hereditary peripheral neuropathy 10/10/2011   Long term current use of anticoagulant - apixiban 08/16/2010   Dyspnea on exertion 09/08/2009   HYPERCHOLESTEROLEMIA-PURE 01/13/2009   Essential hypertension, benign 01/13/2009   EDEMA 01/13/2009   GOUT 01/12/2009   GLAUCOMA 01/12/2009   Venous insufficiency (chronic) (peripheral) 01/12/2009   COPD (chronic obstructive pulmonary disease) (HCC) 01/12/2009  VENTRAL HERNIA 01/12/2009   OSTEOARTHRITIS 01/12/2009   ARTHRITIS 01/12/2009   DEGENERATIVE DISC DISEASE 01/12/2009   CATARACT, HX OF 01/12/2009   HEART MURMUR, HX OF 01/12/2009   BENIGN PROSTATIC HYPERTROPHY, HX OF 01/12/2009   Obesity 01/17/2007   OBSTRUCTIVE SLEEP APNEA 01/17/2007   ASTHMA 01/17/2007   GERD 01/17/2007   HALLUX VALGUS, ACQUIRED 01/17/2007   Pulmonary hypertension (HCC) 01/17/2007   Past Medical History:  Diagnosis Date   AICD (automatic cardioverter/defibrillator) present    Balanitis    +severe phimosis+ buried penis->circ not possible but dorsal slit done 10/2019   BPH (benign prostatic hypertrophy)    with urinary retention.  Renal u/s 12/04/19 NORMAL KIDNEYS, NORMAL BLADDER, NO HYDRONEPHROSIS   CAD (coronary artery disease)    Nonobstructive by 12/2012 cath; then 03/2016 he required BMS to RCA ([facility]).  In-stent restenosis on cath 11/01/16, baloon angioplasty successful.   CATARACT, HX OF    Chronic combined systolic and diastolic CHF (congestive heart failure) (HCC) 05/2016   Ischemic CM.  04/2018 EF  40-45%, grd I DD.  07/2021 EF 55-60%, AV well seated   Chronic pain syndrome    Lumbar DDD; chronic neuropathic pain (DM); has spinal stimulator and sees pain mgmt MD   Complicated UTI (urinary tract infection) 09/2019   Phimoses, acute urinary retention, entoerococcus UTI, enterococc bacteremia.  F/u blood clx's neg x 5d.     COPD    Debilitated patient    Diabetes mellitus type 2 with complications (HCC)    HbA1c jumped from 5.7% to 6.1% 03/2015---started metformin at that time.  DM 2 dx by fasting gluc criteria 2018.  Has chronic neuropath pain   Enterococcal bacteremia 09/2019   Phimoses, acute urinary retention, entoerococcus UTI, enterococc bacteremia.  F/u blood clx's neg x 5d.     Essential hypertension, benign    Fatty liver 2007   2007 u/s showed fatty liver with hepatosplenomegaly.  2019 repeat u/s->fatty liver but no cirrhosis or hepatosplenomegaly.   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 Daiva Eves)   Iron deficiency anemia    09/2022.  Hemoccults x 3 neg Mar 2024.  Improving with oral iron. GI following, no endoscopies   Lumbosacral neuritis    Lumbosacral spondylosis    Lumbar spinal stenosis with neurogenic claudication--contributes to his chronic pain syndrome   Morbid obesity (HCC)    Normal memory function 08/2014   Neuropsychological testing ([facility]): no cognitive impairment or sign of neurodegenerative disorder.  Likely has adjustment d/o with mixed anxiety/depressed features and may benefit from low dose SNRI.     Normocytic anemia 03/2016   Mild-pt needs ferritin and vit B12 level checked  (as of 03/22/16). Hb stable 09/2019.  Hemoccult x 3 NEG 03/2022.  Hemoccults NEG x 03 Sep 2022   NSTEMI (non-ST elevated myocardial infarction) (HCC) 03/20/2016   BMS to RCA   Obesity hypoventilation syndrome (HCC)    Orthostatic hypotension    OSA on CPAP    8 cm H2O   OSTEOARTHRITIS    Paroxysmal atrial fibrillation (HCC) 2003    (? chronic?) Off anticoag for a while  due to falls.  Then apixaban started 12/2014.   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 Larna Daughters): neuropathic pain->diff to treat, failed nucynta, failed spinal stimul trial, oxycontin hs + tramadol + gabap as of 12/2017 f/u Dr. Wynn Banker.   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   Sick sinus syndrome (HCC)    PPM placed   Thrombocytopenia (HCC) 2018   HSM on 2007 abd u/s---suspect some mild splenic sequestration chronically.   Unspecified glaucoma(365.9)    Unspecified hereditary and idiopathic peripheral neuropathy approx age 35   bilat LE's, ? left arm, too.  Feet became progressively numb + left foot pain intermittently.  Pt may be trying a spinal stimulator (as of 05/2015)   Vaccine counseling 11/16/2021   VENOUS INSUFFICIENCY    Being followed by Dr. Lajoyce Corners as of 10/2016 for two R LL venous stasis ulcers/skin  tears.  Healed as of 10/30/16 f/u with Dr. Lajoyce Corners.   Venous stasis dermatitis 12/27/2021   VENTRAL HERNIA     Family History  Problem Relation Age of Onset   Hypertension Mother    Coronary artery disease Mother    Heart attack Mother    Neuropathy Mother    Pulmonary fibrosis Father        asbestosis    Past Surgical History:  Procedure Laterality Date   AMPUTATION Left 04/11/2013   Procedure: AMPUTATION DIGIT Left 3rd toe;  Surgeon: Nadara Mustard, MD;  Location: [facility] OR;  Service: Orthopedics;  Laterality: Left;  Left 3rd toe amputation at [facility]   AMPUTATION Left 08/29/2019   Procedure: LEFT TRANSMETATARSAL AMPUTATION;  Surgeon: Nadara Mustard, MD;  Location: [facility] OR;  Service: Orthopedics;  Laterality: Left;   BIOPSY  05/04/2020   Procedure: BIOPSY;  Surgeon: Lynann Bologna, MD;  Location: [facility] ENDOSCOPY;  Service: Endoscopy;;   CARDIAC CATHETERIZATION  1997; 03/10/16   1997 Non-obstructive disease.  03/2016 BMS to RCA, with 25% pDiag dz, o/w normal cors per cath 03/07/16.  Cath 11/01/16: in stent restenosis, successful baloon angioplasty. 08/2020 branch vessel dz, cont medical therapy rec'd.   CARDIAC CATHETERIZATION  12/24/2012   mild < 20% LCx, prox 30% RCA; LVEF 55-65% , moderate pulmonary HTN, moderate AS   CARDIAC DEFIBRILLATOR PLACEMENT  11/07/2017   [facility] MRI Quad CRT defibrillator   CARDIOVASCULAR STRESS TEST  05/11/16 ([facility])   2017 Myocardial perfusion imaging:  No ischemia; scar in apex, global hypokinesis, EF 36%.  06/15/20->mod primarily fixed inferolat wall defect mildly worse with stress c/w infarct/scar with mild peri-infarct ischemia, normal EF-->for cath per cards.   Carotid dopplers  03/09/2016   [facility]: no hemodynamically significant stenosis on either side.   CHOLECYSTECTOMY     COLONOSCOPY [name] 10/30/2012   Procedure: COLONOSCOPY;  Surgeon: Iva Boop,  MD;  Location: [facility] ENDOSCOPY;  Service: Endoscopy;  Laterality: [name];   CORONARY ANGIOPLASTY WITH STENT PLACEMENT  03/2016;  04/2017   2017-[facility]: BMS to RCA-pt was placed on Brilinta.  04/2017: DES to RCA.   DORSAL SLIT [name] 10/29/2019   for severe phimosis. Procedure: DORSAL SLIT;  Surgeon: Sebastian Ache, MD;  Location: [facility] ORS;  Service: Urology;  Laterality: [name];  45 MINS   ESOPHAGEAL DILATION  05/04/2020   Procedure: ESOPHAGEAL DILATION;  Surgeon: Lynann Bologna, MD;  Location: [facility] ENDOSCOPY;  Service: Endoscopy;;   ESOPHAGOGASTRODUODENOSCOPY (EGD) WITH PROPOFOL [name] 05/04/2020   Procedure: ESOPHAGOGASTRODUODENOSCOPY (EGD) WITH PROPOFOL;  Surgeon: Lynann Bologna, MD;  Location: [name] ENDOSCOPY;  Service: Endoscopy;  Laterality: [name];   EYE SURGERY Bilateral cataract   HEMORRHOID SURGERY     INTRAOCULAR LENS INSERTION Bilateral    KNEE SURGERY Right    LEFT AND RIGHT HEART CATHETERIZATION WITH CORONARY ANGIOGRAM [name] 12/24/2012   Procedure: LEFT AND RIGHT HEART CATHETERIZATION WITH CORONARY ANGIOGRAM;  Surgeon: Peter M Swaziland, MD;  Location: [facility] CATH LAB;  Service: Cardiovascular;  Laterality: [name];   LEG SURGERY Bilateral    lenghtening    PACEMAKER PLACEMENT  04/13/2016   2nd deg HB after TAVR, pt had DC MDT PPM placed.   SHOULDER ARTHROSCOPY  08/30/2011   Procedure: ARTHROSCOPY SHOULDER;  Surgeon: Nadara Mustard, MD;  Location: [facility] OR;  Service: Orthopedics;  Laterality: Right;  Right Shoulder Arthroscopy, Debridement, and Decompression   SPINAL CORD STIMULATOR INSERTION [name] 09/10/2015   Procedure: LUMBAR SPINAL CORD STIMULATOR INSERTION;  Surgeon: Odette Fraction, MD;  Location: [facility] NEURO ORS;  Service: Neurosurgery;  Laterality: [name];   TEE WITHOUT CARDIOVERSION [name] 09/16/2019   Procedure: TRANSESOPHAGEAL ECHOCARDIOGRAM (TEE);  Surgeon: Chilton Si, MD;  Location: [facility] ENDOSCOPY;  Service: Cardiovascular;  Laterality: [name];   TEE WITHOUT CARDIOVERSION [name] 12/02/2019   +vegetation on AVR and pacer lead in RV.  EF 55-60%, normal wall motion.  Valves function normal.  Procedure: TRANSESOPHAGEAL ECHOCARDIOGRAM (TEE);   Surgeon: Sande Rives, MD;  Location: [facility] ENDOSCOPY;  Service: Cardiovascular;  Laterality: [name];   TOE AMPUTATION Left    due to osteomyelitis.  R big toe surg due to osteoarth   TONSILLECTOMY     traeculectomy Left    eye   TRANSCATHETER AORTIC VALVE REPLACEMENT, TRANSFEMORAL  04/11/2016   TRANSESOPHAGEAL ECHOCARDIOGRAM  03/09/2016; 09/2019   [facility]: EF 55-60%, PFO seen with bi-directional shunting, no thrombus in appendage.  09/2019 ->no valvular vegetations. Small patent foramen ovale with predominantly left to right shunting across the interatrial septum.   TRANSTHORACIC ECHOCARDIOGRAM  01/2015; 01/2016; 05/18/16; 09/18/16, 05/2017, 08/2017   01/2015 No signif change in aortic stenosis (moderate).  01/2016 Severe LVH w/small LV cavity, EF 60-65%, grade I diast dysfxn.  05/2016 (s/p TAVR): EF 50-55%, grd I DD, biopros AV good.  08/2016--EF 50-55%, LV septal motion c/w conduction abnl, grd I DD,mild MS,bioprosth aortic valve well seated, w/trace AR. 05/2017 TTE EF 35%. 08/2017-EF 35%, mod diff hypokin LV, grd I DD, biopros AV good.    TRANSTHORACIC ECHOCARDIOGRAM  04/2018; 09/2019   04/2018: EF 40-45%, mod diffuse LV hypokin, grd I DD, bioprosth AV well seated, no AS or AR. 09/2019 EF 60-65%, grd I DD, valves fine, including bioprosth AV. 04/29/20 (tech diff) EF 55-60%, grd I DD, vegetation on MV.  07/2021 EF 55-60% (s/p upgrade to BiV PPM), AV well seated.   VITRECTOMY     Social History   Occupational History   Occupation: Public relations account executive  Comment: retired  Tobacco Use   Smoking status: Never    Passive exposure: Never   Smokeless tobacco: Never  Vaping Use   Vaping Use: Never used  Substance and Sexual Activity   Alcohol use: No    Alcohol/week: 0.0 standard drinks of alcohol   Drug use: No    Types: Oxycodone   Sexual activity: Not Currently

## 2022-12-14 LAB — HEPATITIS PANEL, ACUTE
Hep A IgM: NONREACTIVE
Hepatitis B Surface Ag: NONREACTIVE
Hepatitis C Ab: NONREACTIVE

## 2022-12-14 LAB — ANA SCREEN,IFA,REFLEX TITER/PATTERN,REFLEX MPLX 11 AB CASCADE
Cyclic Citrullin Peptide Ab: <16 UNITS
MUTATED CITRULLINATED VIMENTIN (MCV) AB: <20 U/mL (ref <20)

## 2022-12-14 LAB — IRON,TIBC AND FERRITIN PANEL
%SAT: 20 % (calc) (ref 20–48)
TIBC: 230 mcg/dL (calc) — ABNORMAL LOW (ref 250–425)

## 2022-12-14 LAB — COMPREHENSIVE METABOLIC PANEL
AG Ratio: 1.4 (calc) (ref 1.0–2.5)
Albumin: 2.9 g/dL — ABNORMAL LOW (ref 3.6–5.1)
CO2: 28 mmol/L (ref 20–32)
Globulin: 2.1 g/dL (calc) (ref 1.9–3.7)
Glucose, Bld: 132 mg/dL — ABNORMAL HIGH (ref 65–99)

## 2022-12-14 LAB — MITOCHONDRIAL ANTIBODIES: Mitochondrial M2 Ab, IgG: <=20 U (ref <=20.0)

## 2022-12-14 LAB — ANTI-SMOOTH MUSCLE ANTIBODY, IGG: Actin (Smooth Muscle) Antibody (IGG): <20 U (ref <20)

## 2022-12-20 ENCOUNTER — Ambulatory Visit (INDEPENDENT_AMBULATORY_CARE_PROVIDER_SITE_OTHER): Payer: Medicare Other | Admitting: Family

## 2022-12-20 DIAGNOSIS — L03115 Cellulitis of right lower limb: Secondary | ICD-10-CM | POA: Diagnosis not present

## 2022-12-28 ENCOUNTER — Ambulatory Visit: Payer: Medicare Other | Admitting: Orthopedic Surgery

## 2023-01-05 ENCOUNTER — Encounter: Payer: Self-pay | Admitting: Family

## 2023-01-05 NOTE — Progress Notes (Signed)
Office Visit Note   Patient: Tim [name]           Date of Birth: 1940-12-19           MRN: [id number] Visit Date: 12/20/2022              Requested by: Jeoffrey Massed, MD [address] [place],  [place] [address] PCP: Jeoffrey Massed, MD  Chief Complaint  Patient presents with   Right Leg - Follow-up       HPI: The patient is an 82 year old gentleman who is well-known to our office who presents today in follow-up for ulcer to the right lower extremity.  Has been in a compression wrap for the last week.  Is much improved after a course of Keflex  History significant for COPD, CHF, status post TAVR, aortic stenosis, endocarditis of prosthetic valve and ICD as well as diabetes.  He is on a gram of amoxicillin twice daily chronically.  Denies chills or fevers  Assessment & Plan: Visit Diagnoses: No diagnosis found.  Plan: He will resume his home compression and long-term wound care.  Complete course of Keflex.  Follow-Up Instructions: No follow-ups on file.   Ortho Exam  [name], oriented, no adenopathy, well-dressed, normal affect, normal respiratory effort. On examination of right lower extremity he has good wrinkling of the skin improvement in ulcers and dermatitis.  There is no erythema or warmth.  Resolving cellulitis.  Imaging: No results found. No images are attached to the encounter.  Labs: Lab Results  Component Value Date   HGBA1C 5.5 12/08/2022   HGBA1C 5.5 12/08/2022   HGBA1C 5.5 (A) 12/08/2022   HGBA1C 5.5 12/08/2022   ESRSEDRATE 30 (H) 12/15/2021   ESRSEDRATE 34 (H) 11/24/2020   ESRSEDRATE 38 (H) 07/14/2020   CRP 1.3 11/24/2020   CRP 0.9 07/14/2020   CRP 0.7 05/03/2020   LABURIC 3.5 (L) 09/10/2013   REPTSTATUS 12/15/2021 FINAL 12/14/2021   CULT  12/14/2021    NO GROWTH Performed at [facility], [address], [place], [place] [address]    [facility] ENTEROCOCCUS FAECALIS 09/11/2019     Lab Results  Component  Value Date   ALBUMIN 2.8 (L) 12/15/2021   ALBUMIN 3.2 (L) 12/14/2021   ALBUMIN 3.4 (L) 10/13/2021    Lab Results  Component Value Date   MG 1.7 05/07/2020   MG 1.6 (L) 05/06/2020   MG 1.8 05/05/2020   No results found for: "VD25OH"  No results found for: "PREALBUMIN"    Latest Ref Rng & Units 09/21/2022    3:22 PM 02/10/2022    4:15 PM 12/27/2021    9:35 AM  CBC EXTENDED  WBC 4.0 - 10.5 K/uL 6.7  5.1  4.7   RBC 4.22 - 5.81 Mil/uL 3.57  3.66  3.52   Hemoglobin 13.0 - 17.0 g/dL 82.9  56.2  13.0   HCT 39.0 - 52.0 % 31.6  33.7  32.9   Platelets 150.0 - 400.0 K/uL 204.0  124  76   NEUT# 1,500 - 7,800 cells/uL   2,355   Lymph# 850 - 3,900 cells/uL   1,612      There is no height or weight on file to calculate BMI.  Orders:  No orders of the defined types were placed in this encounter.  No orders of the defined types were placed in this encounter.    Procedures: No procedures performed  Clinical Data: No additional findings.  ROS:  All other systems negative, except as noted in the HPI. Review of Systems  Objective: Vital Signs: There were no vitals taken for this visit.  Specialty Comments:  No specialty comments available.  PMFS History: Patient Active Problem List   Diagnosis Date Noted   Venous stasis dermatitis 12/27/2021   Cellulitis 12/14/2021   Wound of right lower extremity 12/14/2021   Transient hypotension 12/14/2021   Heart failure with preserved ejection fraction (HCC) 12/14/2021   Prediabetes 12/14/2021   Thrombocytopenia (HCC) 12/14/2021   Obesity, Class III, BMI 40-49.9 (morbid obesity) (HCC) 12/14/2021   BPH (benign prostatic hyperplasia) 12/14/2021   Vaccine counseling 11/16/2021   Weakness 02/23/2021   Physical deconditioning 02/23/2021   Rhinitis 07/14/2020   Mitral valve vegetation 05/28/2020   Non-pressure chronic ulcer of other part of left foot limited to breakdown of skin (HCC)    Esophageal dysphagia    Bacteremia     Endocarditis of mitral valve    Fever 04/28/2020   Cervical lymphadenopathy 03/15/2020   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   ICD (implantable cardioverter-defibrillator) infection (HCC) 12/22/2019   Gram-positive bacteremia 11/27/2019   FUO (fever of unknown origin) 11/26/2019   Low blood pressure reading 11/05/2019   Wound dehiscence 10/22/2019   PFO (patent foramen ovale) 10/06/2019   ICD (implantable cardioverter-defibrillator) battery depletion    Pressure injury of skin 09/13/2019   Enterococcal bacteremia history  09/13/2019   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   Urinary retention 09/13/2019   Phimosis 09/13/2019   SIRS (systemic inflammatory response syndrome) (HCC) 09/12/2019   Sepsis (HCC) 09/12/2019   Enterococcus UTI 09/12/2019   Cellulitis of foot    History of transmetatarsal amputation of left foot (HCC)    Subacute osteomyelitis, left ankle and foot (HCC)    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) 04/17/2018   Diabetic neuropathy, painful (HCC) 12/26/2016   Neuropathic pain of foot, left 06/20/2016   Insulin resistance 07/22/2015   Status post amputation of left great toe (HCC) 06/08/2015   Spinal stenosis of lumbar region 02/16/2015   Hereditary and idiopathic peripheral neuropathy 01/12/2015   Oral thrush 12/28/2014   Orthostatic dizziness 05/24/2014   Paresthesias with subjective weakness 01/30/2014   Bilateral thigh pain 01/30/2014   Abdominal wall pain in right upper quadrant 01/22/2014   Gross hematuria 09/25/2013   Intertrigo 09/25/2013   IBS (irritable bowel syndrome) 09/11/2013   Chronic pain syndrome 08/06/2013   CAD (coronary artery disease), native coronary artery 12/25/2012   Paroxysmal atrial fibrillation (HCC) 12/25/2012   Moderate aortic stenosis 12/25/2012   Aortic stenosis  12/24/2012   Personal history of colonic adenoma 10/30/2012   Diverticulosis of colon (without mention of hemorrhage) 10/30/2012   Internal hemorrhoids 10/30/2012   Change in bowel habits intermittent loose stools 10/30/2012   Routine health maintenance 06/09/2012   Hereditary peripheral neuropathy 10/10/2011   Long term current use of anticoagulant - apixiban 08/16/2010   Dyspnea on exertion 09/08/2009   HYPERCHOLESTEROLEMIA-PURE 01/13/2009   Essential hypertension, benign 01/13/2009   EDEMA 01/13/2009   GOUT 01/12/2009   GLAUCOMA 01/12/2009   Venous insufficiency (chronic) (peripheral) 01/12/2009   COPD (chronic obstructive pulmonary disease) (HCC) 01/12/2009   VENTRAL HERNIA 01/12/2009   OSTEOARTHRITIS 01/12/2009   ARTHRITIS 01/12/2009   DEGENERATIVE DISC DISEASE 01/12/2009   CATARACT, HX OF 01/12/2009   HEART MURMUR, HX OF 01/12/2009   BENIGN PROSTATIC HYPERTROPHY, HX OF  01/12/2009   Obesity 01/17/2007   OBSTRUCTIVE SLEEP APNEA 01/17/2007   ASTHMA 01/17/2007   GERD 01/17/2007   HALLUX VALGUS, ACQUIRED 01/17/2007   Pulmonary hypertension (HCC) 01/17/2007   Past Medical History:  Diagnosis Date   AICD (automatic cardioverter/defibrillator) present    Balanitis    +severe phimosis+ buried penis->circ not possible but dorsal slit done 10/2019   BPH (benign prostatic hypertrophy)    with urinary retention.  Renal u/s 12/04/19 NORMAL KIDNEYS, NORMAL BLADDER, NO HYDRONEPHROSIS   CAD (coronary artery disease)    Nonobstructive by 12/2012 cath; then 03/2016 he required BMS to RCA ([facility]).  In-stent restenosis on cath 11/01/16, baloon angioplasty successful.   CATARACT, HX OF    Chronic combined systolic and diastolic CHF (congestive heart failure) (HCC) 05/2016   Ischemic CM.  04/2018 EF 40-45%, grd I DD.  07/2021 EF 55-60%, AV well seated   Chronic pain syndrome    Lumbar DDD; chronic neuropathic pain (DM); has spinal stimulator and sees pain mgmt MD   Complicated UTI (urinary tract  infection) 09/2019   Phimoses, acute urinary retention, entoerococcus UTI, enterococc bacteremia.  F/u blood clx's neg x 5d.     COPD    Debilitated patient    Diabetes mellitus type 2 with complications (HCC)    HbA1c jumped from 5.7% to 6.1% 03/2015---started metformin at that time.  DM 2 dx by fasting gluc criteria 2018.  Has chronic neuropath pain   Enterococcal bacteremia 09/2019   Phimoses, acute urinary retention, entoerococcus UTI, enterococc bacteremia.  F/u blood clx's neg x 5d.     Essential hypertension, benign    Fatty liver 2007   2007 u/s showed fatty liver with hepatosplenomegaly.  2019 repeat u/s->fatty liver but no cirrhosis or hepatosplenomegaly.   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 Daiva Eves)   Iron deficiency anemia    09/2022.  Hemoccults x 3 neg Mar 2024.  Improving with oral iron. GI following, no endoscopies   Lumbosacral neuritis    Lumbosacral spondylosis    Lumbar spinal stenosis with neurogenic claudication--contributes to his chronic pain syndrome   Morbid obesity (HCC)    Normal memory function 08/2014   Neuropsychological testing ([facility]): no cognitive impairment or sign of neurodegenerative disorder.  Likely has adjustment d/o with mixed anxiety/depressed features and may benefit from low dose SNRI.     Normocytic anemia 03/2016   Mild-pt needs ferritin and vit B12 level checked (as of 03/22/16). Hb stable 09/2019.  Hemoccult x 3 NEG 03/2022.  Hemoccults NEG x 03 Sep 2022   NSTEMI (non-ST elevated myocardial infarction) (HCC) 03/20/2016   BMS to RCA   Obesity hypoventilation  syndrome (HCC)    Orthostatic hypotension    OSA on CPAP    8 cm H2O   OSTEOARTHRITIS    Paroxysmal atrial fibrillation (HCC) 2003    (? chronic?) Off anticoag for a while due to falls.  Then apixaban started 12/2014.   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 Larna Daughters): neuropathic pain->diff to treat, failed nucynta, failed spinal stimul trial, oxycontin hs + tramadol +  gabap as of 12/2017 f/u Dr. Wynn Banker.   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   Sick sinus syndrome (HCC)    PPM placed   Thrombocytopenia (HCC) 2018   HSM on 2007 abd u/s---suspect some mild splenic sequestration chronically.   Unspecified glaucoma(365.9)    Unspecified hereditary and idiopathic peripheral neuropathy approx age 29   bilat LE's, ? left arm, too.  Feet became progressively numb + left foot pain intermittently.  Pt may be trying a spinal stimulator (as of 05/2015)   Vaccine counseling 11/16/2021   VENOUS INSUFFICIENCY    Being followed by Dr. Lajoyce Corners as of 10/2016 for two R LL venous stasis ulcers/skin tears.  Healed as of 10/30/16 f/u with Dr. Lajoyce Corners.   Venous stasis dermatitis 12/27/2021   VENTRAL HERNIA     Family History  Problem Relation Age of Onset   Hypertension Mother    Coronary artery  disease Mother    Heart attack Mother    Neuropathy Mother    Pulmonary fibrosis Father        asbestosis    Past Surgical History:  Procedure Laterality Date   AMPUTATION Left 04/11/2013   Procedure: AMPUTATION DIGIT Left 3rd toe;  Surgeon: Nadara Mustard, MD;  Location: [facility] OR;  Service: Orthopedics;  Laterality: Left;  Left 3rd toe amputation at [facility]   AMPUTATION Left 08/29/2019   Procedure: LEFT TRANSMETATARSAL AMPUTATION;  Surgeon: Nadara Mustard, MD;  Location: [facility] OR;  Service: Orthopedics;  Laterality: Left;   BIOPSY  05/04/2020   Procedure: BIOPSY;  Surgeon: Lynann Bologna, MD;  Location: [facility] ENDOSCOPY;  Service: Endoscopy;;   CARDIAC CATHETERIZATION  1997; 03/10/16   1997 Non-obstructive disease.  03/2016 BMS to RCA, with 25% pDiag dz, o/w normal cors per cath 03/07/16.  Cath 11/01/16: in stent restenosis, successful baloon angioplasty. 08/2020 branch vessel dz, cont medical therapy rec'd.   CARDIAC CATHETERIZATION  12/24/2012   mild < 20% LCx, prox 30% RCA; LVEF 55-65% , moderate pulmonary HTN, moderate AS   CARDIAC DEFIBRILLATOR PLACEMENT  11/07/2017   [facility] MRI Quad CRT defibrillator   CARDIOVASCULAR STRESS TEST  05/11/16 ([facility])   2017 Myocardial perfusion imaging:  No ischemia; scar in apex, global hypokinesis, EF 36%.  06/15/20->mod primarily fixed inferolat wall defect mildly worse with stress c/w infarct/scar with mild peri-infarct ischemia, normal EF-->for cath per cards.   Carotid dopplers  03/09/2016   [facility]: no hemodynamically significant stenosis on either side.   CHOLECYSTECTOMY     COLONOSCOPY [name] 10/30/2012   Procedure: COLONOSCOPY;  Surgeon: Iva Boop, MD;  Location: [facility] ENDOSCOPY;  Service: Endoscopy;  Laterality: [name];   CORONARY ANGIOPLASTY WITH STENT PLACEMENT  03/2016; 04/2017   2017-[facility]: BMS to RCA-pt was placed on Brilinta.  04/2017: DES to RCA.  DORSAL SLIT [name] 10/29/2019   for severe phimosis. Procedure: DORSAL SLIT;  Surgeon: Sebastian Ache, MD;   Location: [facility] ORS;  Service: Urology;  Laterality: [name];  45 MINS   ESOPHAGEAL DILATION  05/04/2020   Procedure: ESOPHAGEAL DILATION;  Surgeon: Lynann Bologna, MD;  Location: [facility] ENDOSCOPY;  Service: Endoscopy;;   ESOPHAGOGASTRODUODENOSCOPY (EGD) WITH PROPOFOL [name] 05/04/2020   Procedure: ESOPHAGOGASTRODUODENOSCOPY (EGD) WITH PROPOFOL;  Surgeon: Lynann Bologna, MD;  Location: [facility] ENDOSCOPY;  Service: Endoscopy;  Laterality: [name];   EYE SURGERY Bilateral cataract   HEMORRHOID SURGERY     INTRAOCULAR LENS INSERTION Bilateral    KNEE SURGERY Right    LEFT AND RIGHT HEART CATHETERIZATION WITH CORONARY ANGIOGRAM [name] 12/24/2012   Procedure: LEFT AND RIGHT HEART CATHETERIZATION WITH CORONARY ANGIOGRAM;  Surgeon: Peter M Swaziland, MD;  Location: [name] CATH LAB;  Service: Cardiovascular;  Laterality: [name];   LEG SURGERY Bilateral    lenghtening    PACEMAKER PLACEMENT  04/13/2016   2nd deg HB after TAVR, pt had DC MDT PPM placed.   SHOULDER ARTHROSCOPY  08/30/2011   Procedure: ARTHROSCOPY SHOULDER;  Surgeon: Nadara Mustard, MD;  Location: [name] OR;  Service: Orthopedics;  Laterality: Right;  Right Shoulder Arthroscopy, Debridement, and Decompression   SPINAL CORD STIMULATOR INSERTION [name] 09/10/2015   Procedure: LUMBAR SPINAL CORD STIMULATOR INSERTION;  Surgeon: Odette Fraction, MD;  Location: [facility] NEURO ORS;  Service: Neurosurgery;  Laterality: [name];   TEE WITHOUT CARDIOVERSION [name] 09/16/2019   Procedure: TRANSESOPHAGEAL ECHOCARDIOGRAM (TEE);  Surgeon: Chilton Si, MD;  Location: [facility] ENDOSCOPY;  Service: Cardiovascular;  Laterality: [name];   TEE WITHOUT CARDIOVERSION [name] 12/02/2019   +vegetation on AVR and pacer lead in RV.  EF 55-60%, normal wall motion.  Valves function normal.  Procedure: TRANSESOPHAGEAL ECHOCARDIOGRAM (TEE);  Surgeon: Sande Rives, MD;  Location: [facility] ENDOSCOPY;  Service: Cardiovascular;  Laterality: [name];   TOE AMPUTATION Left    due to osteomyelitis.  R big toe surg due to osteoarth    TONSILLECTOMY     traeculectomy Left    eye   TRANSCATHETER AORTIC VALVE REPLACEMENT, TRANSFEMORAL  04/11/2016   TRANSESOPHAGEAL ECHOCARDIOGRAM  03/09/2016; 09/2019   [facility]: EF 55-60%, PFO seen with bi-directional shunting, no thrombus in appendage.  09/2019 ->no valvular vegetations. Small patent foramen ovale with predominantly left to right shunting across the interatrial septum.   TRANSTHORACIC ECHOCARDIOGRAM  01/2015; 01/2016; 05/18/16; 09/18/16, 05/2017, 08/2017   01/2015 No signif change in aortic stenosis (moderate).  01/2016 Severe LVH w/small LV cavity, EF 60-65%, grade I diast dysfxn.  05/2016 (s/p TAVR): EF 50-55%, grd I DD, biopros AV good.  08/2016--EF 50-55%, LV septal motion c/w conduction abnl, grd I DD,mild MS,bioprosth aortic valve well seated, w/trace AR. 05/2017 TTE EF 35%. 08/2017-EF 35%, mod diff hypokin LV, grd I DD, biopros AV good.    TRANSTHORACIC ECHOCARDIOGRAM  04/2018; 09/2019   04/2018: EF 40-45%, mod diffuse LV hypokin, grd I DD, bioprosth AV well seated, no AS or AR. 09/2019 EF 60-65%, grd I DD, valves fine, including bioprosth AV. 04/29/20 (tech diff) EF 55-60%, grd I DD, vegetation on MV.  07/2021 EF 55-60% (s/p upgrade to BiV PPM), AV well seated.   VITRECTOMY     Social History   Occupational History   Occupation: Public relations account executive    Comment: retired  Tobacco Use   Smoking status: Never    Passive exposure: Never   Smokeless tobacco: Never  Vaping Use   Vaping Use: Never used  Substance and Sexual Activity  Alcohol use: No    Alcohol/week: 0.0 standard drinks of alcohol   Drug use: No    Types: Oxycodone   Sexual activity: Not Currently

## 2023-02-15 ENCOUNTER — Encounter (INDEPENDENT_AMBULATORY_CARE_PROVIDER_SITE_OTHER): Payer: Self-pay

## 2023-02-25 ENCOUNTER — Other Ambulatory Visit: Payer: Self-pay | Admitting: Family Medicine

## 2023-02-28 ENCOUNTER — Ambulatory Visit: Payer: Medicare Other | Admitting: Infectious Disease

## 2023-03-09 ENCOUNTER — Ambulatory Visit: Payer: Medicare Other | Admitting: Family Medicine

## 2023-03-09 ENCOUNTER — Encounter: Payer: Self-pay | Admitting: Family Medicine

## 2023-03-09 VITALS — BP 137/82 | HR 89 | Temp 98.6°F | Ht 76.0 in | Wt 277.4 lb

## 2023-03-09 DIAGNOSIS — I480 Paroxysmal atrial fibrillation: Secondary | ICD-10-CM | POA: Diagnosis not present

## 2023-03-09 DIAGNOSIS — Z23 Encounter for immunization: Secondary | ICD-10-CM

## 2023-03-09 DIAGNOSIS — E1140 Type 2 diabetes mellitus with diabetic neuropathy, unspecified: Secondary | ICD-10-CM | POA: Diagnosis not present

## 2023-03-09 DIAGNOSIS — D508 Other iron deficiency anemias: Secondary | ICD-10-CM

## 2023-03-09 LAB — POCT GLYCOSYLATED HEMOGLOBIN (HGB A1C)
HbA1c POC (<> result, manual entry): 5.4 % (ref 4.0–5.6)
HbA1c, POC (controlled diabetic range): 5.4 % (ref 0.0–7.0)
HbA1c, POC (prediabetic range): 5.4 % — AB (ref 5.7–6.4)
Hemoglobin A1C: 5.4 % (ref 4.0–5.6)

## 2023-03-09 NOTE — Progress Notes (Unsigned)
OFFICE VISIT  03/09/2023  CC:  Chief Complaint  Patient presents with   Medical Management of Chronic Issues    Patient is a 82 y.o. male who presents for 61-month follow-up diabetes, anemia, and chronic bilateral lower extremity edema. A/P as of last visit: "1 diabetes with peripheral neuropathy. Great control on diet alone.  Hemoglobin A1c today is 5.5%.   2.  Iron deficiency anemia.  Suspect malabsorption, possibly due to use of high-dose PPI chronically. Hemoccults have been negative. GI saw him and felt like endoscopy not necessary at this time. He has been on iron twice a day for the last several months.  Hemoglobin most recently 10.9 a week ago. Check iron panel today.   3.  Chronic bilateral lower extremity edema: Multifactorial  (venous insufficiency/lymphedema, pulmonary hypertension, chronic congestive heart failure, immobility).  this has been stable lately. Continue torsemide daily.   4.  Hypokalemia: Noted on emergency department labs 12/01/2022. Recheck potassium today.   5.  Elevated transaminases: AST was 165 and ALT 80 one week ago in the ED. Alkaline phosphatase was 95, total bilirubin 0.46, and albumin 2.7. Per GI, will do lab workup.  He has an ultrasound of his liver scheduled. [name] has a lot of trouble getting out of his house so we chose to get his labs done here today and we will forward results to his gastroenterology provider."  INTERIM HX: ***  His liver ultrasound did not show any concerning findings. His autoimmune workup was negative. His LFTs trended down, suggestive of an acute infectious gastroenteritis with a reactive LFT elevation. GI continues to recommend iron supplementation and monitoring, no procedures.  The last saw him on 02/09/2023.  They wanted to see him back in 6 months.  Past Medical History:  Diagnosis Date   AICD (automatic cardioverter/defibrillator) present    Balanitis    +severe phimosis+ buried penis->circ not possible but  dorsal slit done 10/2019   BPH (benign prostatic hypertrophy)    with urinary retention.  Renal u/s 12/04/19 NORMAL KIDNEYS, NORMAL BLADDER, NO HYDRONEPHROSIS   CAD (coronary artery disease)    Nonobstructive by 12/2012 cath; then 03/2016 he required BMS to RCA ([facility]).  In-stent restenosis on cath 11/01/16, baloon angioplasty successful.   CATARACT, HX OF    Chronic combined systolic and diastolic CHF (congestive heart failure) (HCC) 05/2016   Ischemic CM.  04/2018 EF 40-45%, grd I DD.  07/2021 EF 55-60%, AV well seated   Chronic pain syndrome    Lumbar DDD; chronic neuropathic pain (DM); has spinal stimulator and sees pain mgmt MD   Complicated UTI (urinary tract infection) 09/2019   Phimoses, acute urinary retention, entoerococcus UTI, enterococc bacteremia.  F/u blood clx's neg x 5d.     COPD    Debilitated patient    Diabetes mellitus type 2 with complications (HCC)    HbA1c jumped from 5.7% to 6.1% 03/2015---started metformin at that time.  DM 2 dx by fasting gluc criteria 2018.  Has chronic neuropath pain   Enterococcal bacteremia 09/2019   Phimoses, acute urinary retention, entoerococcus UTI, enterococc bacteremia.  F/u blood clx's neg x 5d.     Essential hypertension, benign    Fatty liver 2007   2007 u/s showed fatty liver with hepatosplenomegaly.  2019 repeat u/s->fatty liver but no cirrhosis or hepatosplenomegaly.   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 Daiva Eves)   Iron deficiency anemia    09/2022.  Hemoccults x 3 neg Mar 2024.  Improving with oral iron. GI following, no endoscopies   Lumbosacral neuritis    Lumbosacral  spondylosis    Lumbar spinal stenosis with neurogenic claudication--contributes to his chronic pain syndrome   Morbid obesity (HCC)    Normal memory function 08/2014   Neuropsychological testing ([facility]): no cognitive impairment or sign of neurodegenerative disorder.  Likely has adjustment d/o with mixed anxiety/depressed features and may benefit from low dose SNRI.     Normocytic anemia 03/2016   Mild-pt needs ferritin and vit B12 level checked (as of 03/22/16). Hb stable 09/2019.  Hemoccult x 3 NEG 03/2022.  Hemoccults NEG x 03 Sep 2022   NSTEMI (non-ST elevated myocardial infarction) (HCC) 03/20/2016   BMS to RCA   Obesity hypoventilation syndrome (HCC)    Orthostatic hypotension    OSA on CPAP    8 cm H2O   OSTEOARTHRITIS    Paroxysmal atrial fibrillation (HCC) 2003    (? chronic?) Off anticoag for a while due to falls.  Then apixaban started 12/2014.   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 Larna Daughters): neuropathic pain->diff to treat, failed nucynta, failed spinal stimul trial, oxycontin hs + tramadol + gabap as of 12/2017 f/u Dr. Wynn Banker.   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   Sick sinus syndrome (HCC)    PPM placed   Thrombocytopenia (HCC) 2018    HSM on 2007 abd u/s---suspect some mild splenic sequestration chronically.   Unspecified glaucoma(365.9)    Unspecified hereditary and idiopathic peripheral neuropathy approx age 39   bilat LE's, ? left arm, too.  Feet became progressively numb + left foot pain intermittently.  Pt may be trying a spinal stimulator (as of 05/2015)   Vaccine counseling 11/16/2021   VENOUS INSUFFICIENCY    Being followed by Dr. Lajoyce Corners as of 10/2016 for two R LL venous stasis ulcers/skin tears.  Healed as of 10/30/16 f/u with Dr. Lajoyce Corners.   Venous stasis dermatitis 12/27/2021   VENTRAL HERNIA     Past Surgical History:  Procedure Laterality Date   AMPUTATION Left 04/11/2013   Procedure: AMPUTATION DIGIT Left 3rd toe;  Surgeon: Nadara Mustard, MD;  Location: [facility] OR;  Service: Orthopedics;  Laterality: Left;  Left 3rd toe amputation at [facility]   AMPUTATION Left 08/29/2019   Procedure: LEFT TRANSMETATARSAL AMPUTATION;  Surgeon: Nadara Mustard, MD;  Location: [facility] OR;  Service: Orthopedics;  Laterality: Left;  BIOPSY  05/04/2020   Procedure: BIOPSY;  Surgeon: Lynann Bologna, MD;  Location: [facility] ENDOSCOPY;  Service: Endoscopy;;   CARDIAC CATHETERIZATION  1997; 03/10/16   1997 Non-obstructive disease.  03/2016 BMS to RCA, with 25% pDiag dz, o/w normal cors per cath 03/07/16.  Cath 11/01/16: in stent restenosis, successful baloon angioplasty. 08/2020 branch vessel dz, cont medical therapy rec'd.   CARDIAC CATHETERIZATION  12/24/2012   mild < 20% LCx, prox 30% RCA; LVEF 55-65% , moderate pulmonary HTN, moderate AS   CARDIAC DEFIBRILLATOR PLACEMENT  11/07/2017   [facility] MRI Quad CRT defibrillator   CARDIOVASCULAR STRESS TEST  05/11/16 ([facility])   2017 Myocardial perfusion imaging:  No ischemia; scar in apex, global hypokinesis, EF 36%.  06/15/20->mod primarily fixed inferolat wall defect mildly worse with stress c/w infarct/scar with mild peri-infarct ischemia, normal EF-->for cath per cards.   Carotid dopplers  03/09/2016   [facility]: no  hemodynamically significant stenosis on either side.   CHOLECYSTECTOMY     COLONOSCOPY [name] 10/30/2012   Procedure: COLONOSCOPY;  Surgeon: Iva Boop, MD;  Location: [facility] ENDOSCOPY;  Service: Endoscopy;  Laterality: [name];   CORONARY ANGIOPLASTY WITH STENT PLACEMENT  03/2016; 04/2017   2017-[facility]: BMS to RCA-pt was placed on Brilinta.  04/2017: DES to RCA.   DORSAL SLIT [name] 10/29/2019   for severe phimosis. Procedure: DORSAL SLIT;  Surgeon: Sebastian Ache, MD;  Location: [facility] ORS;  Service: Urology;  Laterality: [name];  45 MINS   ESOPHAGEAL DILATION  05/04/2020   Procedure: ESOPHAGEAL DILATION;  Surgeon: Lynann Bologna, MD;  Location: [facility] ENDOSCOPY;  Service: Endoscopy;;   ESOPHAGOGASTRODUODENOSCOPY (EGD) WITH PROPOFOL [name] 05/04/2020   Procedure: ESOPHAGOGASTRODUODENOSCOPY (EGD) WITH PROPOFOL;  Surgeon: Lynann Bologna, MD;  Location: [facility] ENDOSCOPY;  Service: Endoscopy;  Laterality: [name];   EYE SURGERY Bilateral cataract   HEMORRHOID SURGERY     INTRAOCULAR LENS INSERTION Bilateral    KNEE SURGERY Right    LEFT AND RIGHT HEART CATHETERIZATION WITH CORONARY ANGIOGRAM [name] 12/24/2012   Procedure: LEFT AND RIGHT HEART CATHETERIZATION WITH CORONARY ANGIOGRAM;  Surgeon: Peter M Swaziland, MD;  Location: [facility] CATH LAB;  Service: Cardiovascular;  Laterality: [name];   LEG SURGERY Bilateral    lenghtening    PACEMAKER PLACEMENT  04/13/2016   2nd deg HB after TAVR, pt had DC MDT PPM placed.   SHOULDER ARTHROSCOPY  08/30/2011   Procedure: ARTHROSCOPY SHOULDER;  Surgeon: Nadara Mustard, MD;  Location: [facility] OR;  Service: Orthopedics;  Laterality: Right;  Right Shoulder Arthroscopy, Debridement, and Decompression   SPINAL CORD STIMULATOR INSERTION [name] 09/10/2015   Procedure: LUMBAR SPINAL CORD STIMULATOR INSERTION;  Surgeon: Odette Fraction, MD;  Location: [facility] NEURO ORS;  Service: Neurosurgery;  Laterality: [name];   TEE WITHOUT CARDIOVERSION [name] 09/16/2019   Procedure: TRANSESOPHAGEAL ECHOCARDIOGRAM (TEE);  Surgeon: Chilton Si, MD;  Location: [facility] ENDOSCOPY;  Service: Cardiovascular;  Laterality: [name];   TEE WITHOUT CARDIOVERSION [name] 12/02/2019   +vegetation on AVR and pacer lead in RV.  EF 55-60%, normal wall motion.  Valves function normal.  Procedure: TRANSESOPHAGEAL ECHOCARDIOGRAM (TEE);  Surgeon: Sande Rives, MD;  Location: [facility] ENDOSCOPY;  Service: Cardiovascular;  Laterality: [name];   TOE AMPUTATION Left    due to osteomyelitis.  R big toe surg due to osteoarth   TONSILLECTOMY     traeculectomy Left    eye   TRANSCATHETER AORTIC VALVE REPLACEMENT, TRANSFEMORAL  04/11/2016   TRANSESOPHAGEAL ECHOCARDIOGRAM  03/09/2016; 09/2019   [facility]: EF 55-60%, PFO seen with bi-directional shunting, no thrombus  in appendage.  09/2019 ->no valvular vegetations. Small patent foramen ovale with predominantly left to right shunting across the interatrial septum.   TRANSTHORACIC ECHOCARDIOGRAM  01/2015; 01/2016; 05/18/16; 09/18/16, 05/2017, 08/2017   01/2015 No signif change in aortic stenosis (moderate).  01/2016 Severe LVH w/small LV cavity, EF 60-65%, grade I diast dysfxn.  05/2016 (s/p TAVR): EF 50-55%, grd I DD, biopros AV good.  08/2016--EF 50-55%, LV septal motion c/w conduction abnl, grd I DD,mild MS,bioprosth aortic valve well seated, w/trace AR. 05/2017 TTE EF 35%. 08/2017-EF 35%, mod diff hypokin LV, grd I DD, biopros AV good.    TRANSTHORACIC ECHOCARDIOGRAM  04/2018; 09/2019   04/2018: EF 40-45%, mod diffuse LV hypokin, grd I DD, bioprosth AV well seated, no AS or AR. 09/2019 EF 60-65%, grd I DD, valves fine, including bioprosth AV. 04/29/20 (tech diff) EF 55-60%, grd I DD, vegetation on MV.  07/2021 EF 55-60% (s/p upgrade to BiV PPM), AV well seated.   VITRECTOMY      Outpatient Medications Prior to Visit  Medication Sig Dispense Refill   allopurinol (ZYLOPRIM) 300 MG tablet TAKE 1 TABLET (300 MG TOTAL) BY MOUTH DAILY. WITH FOOD 90 tablet 1   amoxicillin (AMOXIL) 500 MG capsule TAKE 2 CAPSULES BY MOUTH TWICE A DAY 120  capsule 11   apixaban (ELIQUIS) 5 MG TABS tablet Take 5 mg by mouth 2 (two) times daily.     ASPIRIN LOW DOSE 81 MG EC tablet Take 81 mg by mouth daily.     B Complex-C (B-COMPLEX WITH VITAMIN C) tablet Take 1 tablet by mouth daily. Take 1 tablet daily, 250mg      ezetimibe (ZETIA) 10 MG tablet Take 1 tablet (10 mg total) by mouth daily. 30 tablet 1   ferrous gluconate (FERGON) 324 MG tablet Take 1 tablet (324 mg total) by mouth 2 (two) times daily. 180 tablet 1   finasteride (PROSCAR) 5 MG tablet Take 1 tablet (5 mg total) by mouth daily. 90 tablet 1   fluticasone-salmeterol (WIXELA INHUB) 250-50 MCG/ACT AEPB INHALE 1 PUFF BY MOUTH TWICE A DAY 60 each 1   gabapentin (NEURONTIN) 800 MG tablet Take 1 tablet (800 mg total) by mouth 4 (four) times daily. 120 tablet 1   ipratropium (ATROVENT) 0.03 % nasal spray 2 SPRAY EACH NOSTRIL EVERY 12 HOURS FOR RUNNY NOSE 30 mL 2   Multiple Vitamin (MULTIVITAMIN ADULT PO) Take 1 tablet by mouth daily.     niacin (NIASPAN) 1000 MG CR tablet TAKE 1 TABLET BY MOUTH TWICE A DAY 180 tablet 3   Oxymetazoline HCl (NASAL SPRAY NA) Place into the nose.     pantoprazole (PROTONIX) 40 MG tablet Take 1 tablet (40 mg total) by mouth daily. (Patient taking differently: Take 40 mg by mouth 2 (two) times daily.) 180 tablet 1   potassium chloride SA (KLOR-CON M20) 20 MEQ tablet Take 1 tablet (20 mEq total) by mouth daily. 90 tablet 1   rosuvastatin (CRESTOR) 20 MG tablet Take 40 mg by mouth.     vitamin C (ASCORBIC ACID) 250 MG tablet Take 250 mg by mouth daily.     VITAMIN D, CHOLECALCIFEROL, PO Take 1 tablet by mouth daily.      albuterol (VENTOLIN HFA) 108 (90 Base) MCG/ACT inhaler TAKE 2 PUFFS BY MOUTH EVERY 6 HOURS AS NEEDED FOR WHEEZE OR SHORTNESS OF BREATH (Patient not taking: Reported on 03/09/2023) 18 g 1   cephALEXin (KEFLEX) 500 MG capsule Take 1 capsule (500 mg total) by mouth 3 (  three) times daily. (Patient not taking: Reported on 03/09/2023) 30 capsule 0   Loratadine  (CLARITIN PO) Take 1 tablet by mouth in the morning and at bedtime. (Patient not taking: Reported on 03/09/2023)     mometasone-formoterol (DULERA) 100-5 MCG/ACT AERO Inhale 2 puffs into the lungs as needed for wheezing or shortness of breath. (Patient not taking: Reported on 03/09/2023)     nitroGLYCERIN (NITROSTAT) 0.4 MG SL tablet Place 0.4 mg under the tongue every 5 (five) minutes as needed for chest pain. (Patient not taking: Reported on 03/09/2023)     Oxycodone HCl 20 MG TABS 1 tab po bid prn severe pain (Patient not taking: Reported on 03/09/2023) 60 tablet 0   Probiotic Product (ALIGN PO) Take by mouth daily. (Patient not taking: Reported on 03/09/2023)     promethazine (PHENERGAN) 12.5 MG tablet Take 1 tablet (12.5 mg total) by mouth every 8 (eight) hours as needed for nausea or vomiting. (Patient not taking: Reported on 03/09/2023) 30 tablet 1   No facility-administered medications prior to visit.    Allergies  Allergen Reactions   Brimonidine Tartrate Shortness Of Breath    Alphagan-Shortness of breath   Brinzolamide Shortness Of Breath    AZOPT- Shortness of breath   Latanoprost Shortness Of Breath    XALATAN- Shortness of breath   Nucynta [Tapentadol] Shortness Of Breath   Sulfa Antibiotics Palpitations   Timolol Maleate Shortness Of Breath and Other (See Comments)    TIMOPTIC- Aggravated asthma   Diltiazem Swelling     leg swelling   Rofecoxib Swelling     VIOXX- leg swelling   Vancomycin Rash and Other (See Comments)    Blisters   Codeine Other (See Comments)    Childhood reaction   Tamsulosin Other (See Comments)    Dizziness    Celecoxib Other (See Comments)    CELLBREX-confusion   Colchicine Diarrhea    diarrhea   Tape Rash    Review of Systems As per HPI  PE:    03/09/2023    2:51 PM 12/08/2022    3:21 PM 09/27/2022    4:13 PM  Vitals with BMI  Height 6\' 4"   6\' 4"   Weight 277 lbs 6 oz 289 lbs 290 lbs  BMI 33.78  35.31  Systolic 137 119 387  Diastolic 82 73 76   Pulse 89 94 94     Physical Exam  ***  LABS:  Last CBC Lab Results  Component Value Date   WBC 6.7 09/21/2022   HGB 10.2 (L) 09/21/2022   HCT 31.6 (L) 09/21/2022   MCV 88.4 09/21/2022   MCH 30.9 02/10/2022   RDW 17.9 (H) 09/21/2022   PLT 204.0 09/21/2022   Lab Results  Component Value Date   IRON 46 (L) 12/08/2022   TIBC 230 (L) 12/08/2022   FERRITIN 20 (L) 12/08/2022   Last metabolic panel Lab Results  Component Value Date   GLUCOSE 132 (H) 12/08/2022   NA 143 12/08/2022   K 4.1 12/08/2022   CL 110 12/08/2022   CO2 28 12/08/2022   BUN 10 12/08/2022   CREATININE 0.67 (L) 12/08/2022   GFR 83.52 09/21/2022   CALCIUM 8.2 (L) 12/08/2022   PROT 5.0 (L) 12/08/2022   ALBUMIN 2.8 (L) 12/15/2021   BILITOT 0.6 12/08/2022   ALKPHOS 49 12/15/2021   AST 41 (H) 12/08/2022   ALT 28 12/08/2022   ANIONGAP 10 12/15/2021   Last lipids Lab Results  Component Value Date   CHOL 115  02/10/2022   HDL 58 02/10/2022   LDLCALC 43 02/10/2022   TRIG 65 02/10/2022   CHOLHDL 2.0 02/10/2022   Last hemoglobin A1c Lab Results  Component Value Date   HGBA1C 5.4 03/09/2023   HGBA1C 5.4 03/09/2023   HGBA1C 5.4 (A) 03/09/2023   HGBA1C 5.4 03/09/2023   Last thyroid functions Lab Results  Component Value Date   TSH 0.905 04/29/2020   T3TOTAL 89 09/16/2018   Last vitamin B12 and Folate Lab Results  Component Value Date   VITAMINB12 429 05/24/2016   IMPRESSION AND PLAN:  No problem-specific Assessment & Plan notes found for this encounter.  POC Hba1c is 5.4% today.  An After Visit Summary was printed and given to the patient.  FOLLOW UP: No follow-ups on file.  Signed:  Santiago Bumpers, MD           03/09/2023

## 2023-03-10 LAB — CBC
HCT: 37 % — ABNORMAL LOW (ref 38.5–50.0)
Hemoglobin: 12.4 g/dL — ABNORMAL LOW (ref 13.2–17.1)
MCH: 31.1 pg (ref 27.0–33.0)
MCHC: 33.5 g/dL (ref 32.0–36.0)
MCV: 92.7 fL (ref 80.0–100.0)
MPV: 9.9 fL (ref 7.5–12.5)
Platelets: 103 10*3/uL — ABNORMAL LOW (ref 140–400)
RBC: 3.99 10*6/uL — ABNORMAL LOW (ref 4.20–5.80)
RDW: 12.8 % (ref 11.0–15.0)
WBC: 4.5 10*3/uL (ref 3.8–10.8)

## 2023-03-10 LAB — BASIC METABOLIC PANEL
BUN: 15 mg/dL (ref 7–25)
CO2: 32 mmol/L (ref 20–32)
Calcium: 9.2 mg/dL (ref 8.6–10.3)
Chloride: 104 mmol/L (ref 98–110)
Creat: 0.72 mg/dL (ref 0.70–1.22)
Glucose, Bld: 136 mg/dL — ABNORMAL HIGH (ref 65–99)
Potassium: 4.5 mmol/L (ref 3.5–5.3)
Sodium: 141 mmol/L (ref 135–146)

## 2023-03-10 LAB — IRON,TIBC AND FERRITIN PANEL
%SAT: 39 % (ref 20–48)
Ferritin: 24 ng/mL (ref 24–380)
Iron: 107 ug/dL (ref 50–180)
TIBC: 274 ug/dL (ref 250–425)

## 2023-03-14 ENCOUNTER — Other Ambulatory Visit: Payer: Self-pay | Admitting: Family Medicine

## 2023-03-15 NOTE — Telephone Encounter (Signed)
Refill requested for Gabapentin. Next OV 12/6

## 2023-03-29 ENCOUNTER — Ambulatory Visit (INDEPENDENT_AMBULATORY_CARE_PROVIDER_SITE_OTHER): Payer: Medicare Other | Admitting: Infectious Disease

## 2023-03-29 ENCOUNTER — Encounter: Payer: Self-pay | Admitting: Infectious Disease

## 2023-03-29 ENCOUNTER — Other Ambulatory Visit: Payer: Self-pay

## 2023-03-29 VITALS — BP 121/78 | HR 82 | Temp 97.7°F

## 2023-03-29 DIAGNOSIS — M7989 Other specified soft tissue disorders: Secondary | ICD-10-CM

## 2023-03-29 DIAGNOSIS — Z952 Presence of prosthetic heart valve: Secondary | ICD-10-CM

## 2023-03-29 DIAGNOSIS — M86272 Subacute osteomyelitis, left ankle and foot: Secondary | ICD-10-CM

## 2023-03-29 DIAGNOSIS — B952 Enterococcus as the cause of diseases classified elsewhere: Secondary | ICD-10-CM

## 2023-03-29 DIAGNOSIS — I480 Paroxysmal atrial fibrillation: Secondary | ICD-10-CM

## 2023-03-29 DIAGNOSIS — Z23 Encounter for immunization: Secondary | ICD-10-CM

## 2023-03-29 DIAGNOSIS — T827XXD Infection and inflammatory reaction due to other cardiac and vascular devices, implants and grafts, subsequent encounter: Secondary | ICD-10-CM | POA: Diagnosis not present

## 2023-03-29 DIAGNOSIS — Z7185 Encounter for immunization safety counseling: Secondary | ICD-10-CM

## 2023-03-29 DIAGNOSIS — Z89432 Acquired absence of left foot: Secondary | ICD-10-CM

## 2023-03-29 DIAGNOSIS — I059 Rheumatic mitral valve disease, unspecified: Secondary | ICD-10-CM

## 2023-03-29 LAB — CBC WITH DIFFERENTIAL/PLATELET
Absolute Monocytes: 493 cells/uL (ref 200–950)
Basophils Absolute: 58 cells/uL (ref 0–200)
Basophils Relative: 1 %
Eosinophils Absolute: 191 cells/uL (ref 15–500)
Eosinophils Relative: 3.3 %
HCT: 39.5 % (ref 38.5–50.0)
Hemoglobin: 12.8 g/dL — ABNORMAL LOW (ref 13.2–17.1)
Lymphs Abs: 1833 cells/uL (ref 850–3900)
MCH: 30.8 pg (ref 27.0–33.0)
MCHC: 32.4 g/dL (ref 32.0–36.0)
MCV: 95 fL (ref 80.0–100.0)
MPV: 10.7 fL (ref 7.5–12.5)
Monocytes Relative: 8.5 %
Neutro Abs: 3225 cells/uL (ref 1500–7800)
Neutrophils Relative %: 55.6 %
Platelets: 131 10*3/uL — ABNORMAL LOW (ref 140–400)
RBC: 4.16 10*6/uL — ABNORMAL LOW (ref 4.20–5.80)
RDW: 13.1 % (ref 11.0–15.0)
Total Lymphocyte: 31.6 %
WBC: 5.8 10*3/uL (ref 3.8–10.8)

## 2023-03-29 LAB — SEDIMENTATION RATE: Sed Rate: 14 mm/h (ref 0–20)

## 2023-03-29 MED ORDER — AMOXICILLIN 500 MG PO CAPS: 1000.0000 mg | ORAL_CAPSULE | Freq: Two times a day (BID) | ORAL | 11 refills | Status: DC

## 2023-03-29 NOTE — Progress Notes (Signed)
Subjective:  Chief complaint: Follow-up for pacemaker infection with Enterococcus, endocarditis osteomyelitis of the foot with wound having healed up but now some worsening swelling at that site over the last 2 weeks     Patient ID: Tim [name], male    DOB: February 25, 1941, 82 y.o.   MRN: [id number]  HPI  82 y.o. male who  initially presented with AMP sensitive enterococcal UTI complicated by enterococcal bacteremia in March 2021 in the setting of AICD and TAVR. TTE and TEE at the time were without evidence of vegetation and was prescribed 2 weeks of ampicillin. On follow up on 4/21 there was worsening of his amputation site with erythema and drainage. He was placed on doxycycline by Dr. Lajoyce [name] which was then changed to Zyvox by me. This resolved his symptoms. On follow up with myself  on 11/26/2019 he was experiencing fevers and dizziness with concern for systemic infection. Blood cultures in the ID clinic were positive for Enterococcus faecalis bacteremia. Surgical site was well healed at that time. 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 Recommended treatment was 6 weeks of ampicillin and ceftriaxone through 6/21. Feeling better on 6/21 and transitioned to life-long suppression with amoxicillin three times daily.    [name] was then seen on 7/15 /2021 seen by Dr. Ninetta [name] with concern for cellulitis and was continued on his Amoxicillin. He followed up with Dr. Ninetta [name] on 8/6 and continued to do well with Amoxicillin.   Blood cultures no growth but MV had vegetation. TEE was not successful.  He was retreated with ampicillin high-dose and ceftriaxone twice daily.  He completed 6 weeks of therapy and  was then on  high-dose amoxicillin to 1000 g twice daily.  Followed very closely by Dr. Lajoyce [name] for his chronic surgical wound site where he had osteomyelitis in that area is been doing well according to him and also going to Dr. Audrie [name] notes.  He was then admitted to [facility]  with infection of his foot having presented with redness and drainage from the left TMA site. Blood cultures from admission were negative. CT of the left foot had cellulitis of the distal forefoot with abscess and acute osteomyelitis of the second third and fourth metatarsal stumps. There was also possible acute osteomyelitis of the first metatarsal stump.  On 1/12 he underwent I&D with revision of transmetatarsal amputation and bone biopsy.  Operative culture grew 1 colony of staph lugdinensis. There was concern for residual osteomyelitis  He was discharged to SNF on 1/22 on cefepime/flagyl with plans to continue through 2/22. He presented back to the ED 1/23 reporting inadequate care at the SNF. He was afebrile with WBC 7,300 and procalcitonin 0.16. He reported some queasiness and metallic taste in his mouth concerning for flagyl adverse events. He was changed to daptomycin and ertapenem to finish his course and sent back to the facility on 07/28/2021  He followed up with Dr. Lunette [name]. He was also was seen in followup with Dr.Taylor with Podiatry with [facility] and later with Dr. Lajoyce [name] here in [place] who has been happy with his progress. After finishing his  IV antibiotics he went back to chronic high dose suppressive therapy with amoxicillin 1 gram po bid.   Tim [name] was  then admitted the hospital and was treated for possible cellulitis in the context of superimposed venous stasis changes.  He was given Zyvox IV and then a supply of oral Zyvox which was then extended by his primary care physician.   Tim [name]  has continued to follow closely Dr. Lajoyce [name] who has performed local debridements on his foot.  Last saw Tim [name] in March.   Then followed in Dr. Audrie [name] office and recently seen Tim [name] as recently as June 2024.   Time he was struggling with an early potential infection in the right leg which is resolved.  He now in the last 2 weeks to experience swelling at the amputation site on the left where the wound  had completely healed up and still appears healed up.  He is otherwise doing well.      Past Medical History:  Diagnosis Date   AICD (automatic cardioverter/defibrillator) present    Balanitis    +severe phimosis+ buried penis->circ not possible but dorsal slit done 10/2019   BPH (benign prostatic hypertrophy)    with urinary retention.  Renal u/s 12/04/19 NORMAL KIDNEYS, NORMAL BLADDER, NO HYDRONEPHROSIS   CAD (coronary artery disease)    Nonobstructive by 12/2012 cath; then 03/2016 he required BMS to RCA ([facility]).  In-stent restenosis on cath 11/01/16, baloon angioplasty successful.   CATARACT, HX OF    Chronic combined systolic and diastolic CHF (congestive heart failure) (HCC) 05/2016   Ischemic CM.  04/2018 EF 40-45%, grd I DD.  07/2021 EF 55-60%, AV well seated   Chronic pain syndrome    Lumbar DDD; chronic neuropathic pain (DM); has spinal stimulator and sees pain mgmt MD   Complicated UTI (urinary tract infection) 09/2019   Phimoses, acute urinary retention, entoerococcus UTI, enterococc bacteremia.  F/u blood clx's neg x 5d.     COPD    Debilitated patient    Diabetes mellitus type 2 with complications (HCC)    HbA1c jumped from 5.7% to 6.1% 03/2015---started metformin at that time.  DM 2 dx by fasting gluc criteria 2018.  Has chronic neuropath pain   Enterococcal bacteremia 09/2019   Phimoses, acute urinary retention, entoerococcus UTI, enterococc bacteremia.  F/u blood clx's neg x 5d.     Essential hypertension, benign    Fatty liver 2007   2007 u/s showed fatty liver with hepatosplenomegaly.  2019 repeat u/s->fatty liver but no cirrhosis or hepatosplenomegaly.   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 Daiva Eves)   Iron deficiency anemia    09/2022.  Hemoccults x 3 neg Mar 2024.  Improving with oral iron. GI following, no endoscopies   Lumbosacral neuritis    Lumbosacral spondylosis    Lumbar spinal stenosis with neurogenic claudication--contributes to his chronic pain syndrome   Morbid obesity (HCC)    Normal memory function 08/2014   Neuropsychological testing ([facility]): no cognitive impairment or sign of neurodegenerative disorder.  Likely has adjustment d/o with mixed anxiety/depressed features and may benefit from low dose SNRI.     Normocytic anemia 03/2016   Mild-pt needs ferritin and vit B12 level checked (as of 03/22/16). Hb stable 09/2019.  Hemoccult x 3 NEG 03/2022.  Hemoccults NEG x 03 Sep 2022   NSTEMI (non-ST elevated myocardial infarction) (HCC) 03/20/2016   BMS to RCA   Obesity hypoventilation syndrome (HCC)    Orthostatic hypotension    OSA on  CPAP    8 cm H2O   OSTEOARTHRITIS    Paroxysmal atrial fibrillation (HCC) 2003    (? chronic?) Off anticoag for a while due to falls.  Then apixaban started 12/2014.   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 Larna Daughters): neuropathic pain->diff to treat, failed nucynta, failed spinal stimul trial, oxycontin hs + tramadol + gabap as of 12/2017 f/u Dr. Wynn Banker.   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   Sick sinus syndrome (HCC)    PPM placed   Thrombocytopenia (HCC) 2018   HSM on 2007 abd u/s---suspect some mild splenic sequestration chronically.   Unspecified glaucoma(365.9)    Unspecified hereditary and idiopathic peripheral neuropathy approx age 60   bilat LE's, ? left arm, too.  Feet became progressively numb + left foot pain intermittently.  Pt may be trying a spinal stimulator (as of 05/2015)   Vaccine counseling 11/16/2021   VENOUS INSUFFICIENCY    Being followed by Dr. Lajoyce [name] as of 10/2016 for two R LL venous stasis ulcers/skin tears.  Healed as of 10/30/16 f/u with Dr. Lajoyce [name].   Venous stasis dermatitis 12/27/2021   VENTRAL HERNIA     Past Surgical History:  Procedure Laterality Date   AMPUTATION Left 04/11/2013   Procedure: AMPUTATION DIGIT Left 3rd toe;  Surgeon: Nadara Mustard, MD;  Location: [facility] OR;  Service: Orthopedics;  Laterality: Left;  Left 3rd toe amputation at [facility]   AMPUTATION Left 08/29/2019   Procedure: LEFT TRANSMETATARSAL AMPUTATION;  Surgeon: Nadara Mustard, MD;  Location: [facility] OR;  Service: Orthopedics;  Laterality: Left;   BIOPSY  05/04/2020   Procedure: BIOPSY;  Surgeon: Lynann Bologna, MD;  Location: [facility] ENDOSCOPY;  Service: Endoscopy;;   CARDIAC CATHETERIZATION  1997; 03/10/16   1997 Non-obstructive disease.  03/2016 BMS to RCA, with 25% pDiag dz, o/w normal cors per cath 03/07/16.  Cath 11/01/16: in stent restenosis, successful baloon angioplasty. 08/2020 branch vessel dz, cont medical therapy rec'd.   CARDIAC CATHETERIZATION  12/24/2012   mild < 20% LCx, prox 30% RCA; LVEF 55-65% , moderate pulmonary HTN, moderate AS   CARDIAC DEFIBRILLATOR PLACEMENT  11/07/2017   Claria MRI Quad CRT defibrillator   CARDIOVASCULAR STRESS TEST  05/11/16 ([facility])   2017 Myocardial perfusion imaging:  No  ischemia; scar in apex, global hypokinesis, EF 36%.  06/15/20->mod primarily fixed inferolat wall defect mildly worse with stress c/w infarct/scar with mild peri-infarct ischemia, normal EF-->for cath per cards.   Carotid dopplers  03/09/2016   [facility]: no hemodynamically significant stenosis on either side.   CHOLECYSTECTOMY     COLONOSCOPY [name] 10/30/2012   Procedure: COLONOSCOPY;  Surgeon: Iva Boop, MD;  Location: [facility] ENDOSCOPY;  Service: Endoscopy;  Laterality: [name];   CORONARY ANGIOPLASTY WITH STENT PLACEMENT  03/2016; 04/2017   [id number]: BMS to RCA-pt was placed on Brilinta.  04/2017: DES to RCA.   DORSAL SLIT [name] 10/29/2019   for severe phimosis. Procedure: DORSAL SLIT;  Surgeon: Sebastian Ache, MD;  Location: [facility] ORS;  Service: Urology;  Laterality: [name];  45 MINS   ESOPHAGEAL DILATION  05/04/2020   Procedure: ESOPHAGEAL DILATION;  Surgeon: Lynann Bologna, MD;  Location: [facility] ENDOSCOPY;  Service: Endoscopy;;   ESOPHAGOGASTRODUODENOSCOPY (EGD) WITH PROPOFOL [name] 05/04/2020   Procedure: ESOPHAGOGASTRODUODENOSCOPY (EGD) WITH PROPOFOL;  Surgeon: Lynann Bologna, MD;  Location: [name] ENDOSCOPY;  Service: Endoscopy;  Laterality: [name];   EYE SURGERY Bilateral cataract   HEMORRHOID SURGERY     INTRAOCULAR LENS INSERTION Bilateral    KNEE SURGERY Right    LEFT AND RIGHT HEART CATHETERIZATION WITH CORONARY ANGIOGRAM [name] 12/24/2012   Procedure: LEFT AND RIGHT HEART CATHETERIZATION WITH CORONARY ANGIOGRAM;  Surgeon: Peter M Swaziland, MD;  Location: [facility] CATH LAB;  Service: Cardiovascular;  Laterality: [name];   LEG SURGERY Bilateral    lenghtening    PACEMAKER PLACEMENT  04/13/2016   2nd deg HB after TAVR, pt had DC MDT PPM placed.   SHOULDER ARTHROSCOPY  08/30/2011   Procedure: ARTHROSCOPY SHOULDER;  Surgeon: Nadara Mustard, MD;  Location: [facility] OR;  Service: Orthopedics;  Laterality: Right;  Right Shoulder Arthroscopy, Debridement, and Decompression   SPINAL CORD STIMULATOR INSERTION [name] 09/10/2015    Procedure: LUMBAR SPINAL CORD STIMULATOR INSERTION;  Surgeon: Odette Fraction, MD;  Location: [facility] NEURO ORS;  Service: Neurosurgery;  Laterality: [name];   TEE WITHOUT CARDIOVERSION [name] 09/16/2019   Procedure: TRANSESOPHAGEAL ECHOCARDIOGRAM (TEE);  Surgeon: Chilton Si, MD;  Location: [facility] ENDOSCOPY;  Service: Cardiovascular;  Laterality: [name];   TEE WITHOUT CARDIOVERSION [name] 12/02/2019   +vegetation on AVR and pacer lead in RV.  EF 55-60%, normal wall motion.  Valves function normal.  Procedure: TRANSESOPHAGEAL ECHOCARDIOGRAM (TEE);  Surgeon: Sande Rives, MD;  Location: [facility] ENDOSCOPY;  Service: Cardiovascular;  Laterality: [name];   TOE AMPUTATION Left    due to osteomyelitis.  R big toe surg due to osteoarth   TONSILLECTOMY     traeculectomy Left    eye   TRANSCATHETER AORTIC VALVE REPLACEMENT, TRANSFEMORAL  04/11/2016   TRANSESOPHAGEAL ECHOCARDIOGRAM  03/09/2016; 09/2019   [facility]: EF 55-60%, PFO seen with bi-directional shunting, no thrombus in appendage.  09/2019 ->no valvular vegetations. Small patent foramen ovale with predominantly left to right shunting across the interatrial septum.   TRANSTHORACIC ECHOCARDIOGRAM  01/2015; 01/2016; 05/18/16; 09/18/16, 05/2017, 08/2017   01/2015 No signif change in aortic stenosis (moderate).  01/2016 Severe LVH w/small LV cavity, EF 60-65%, grade I diast dysfxn.  05/2016 (s/p TAVR): EF 50-55%, grd I DD, biopros AV good.  08/2016--EF 50-55%, LV septal motion c/w conduction abnl, grd I DD,mild MS,bioprosth aortic valve well seated, w/trace AR. 05/2017 TTE EF 35%. 08/2017-EF 35%, mod diff hypokin LV, grd I DD, biopros AV good.    TRANSTHORACIC ECHOCARDIOGRAM  04/2018; 09/2019   04/2018: EF 40-45%, mod diffuse LV hypokin, grd I DD, bioprosth AV well seated, no AS or AR. 09/2019 EF 60-65%, grd I DD, valves fine, including bioprosth AV. 04/29/20 (tech diff) EF 55-60%, grd I DD, vegetation on MV.  07/2021 EF 55-60% (s/p upgrade to BiV PPM), AV well seated.   VITRECTOMY       Family History  Problem Relation Age of Onset   Hypertension Mother    Coronary artery disease Mother    Heart attack Mother  Neuropathy Mother    Pulmonary fibrosis Father        asbestosis      Social History   Socioeconomic History   Marital status: Married    Spouse name: Not on file   Number of children: 0   Years of education: 20   Highest education level: Not on file  Occupational History   Occupation: Public relations account executive    Comment: retired  Tobacco Use   Smoking status: Never    Passive exposure: Never   Smokeless tobacco: Never  Vaping Use   Vaping status: Never Used  Substance and Sexual Activity   Alcohol use: No    Alcohol/week: 0.0 standard drinks of alcohol   Drug use: No    Types: Oxycodone   Sexual activity: Not Currently  Other Topics Concern   Not on file  Social History Narrative   HSG, [name] - BS, [name] - MS-engineering, 2 years on PhD - [place]. Married - '65 - 94yrs/divorced; '76- 3 yrs/divorced; '92 . No children. 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    Lives with wife as of 2020. ACP/Living Will - Yes CPR; long-term Mechanical ventilation as long as he was able to cognate; ok for long term artificial nutrition. Precondition being able to cognate and not to have too much pain.    Social Determinants of Health   Financial Resource Strain: Low Risk  (09/04/2022)   Received from [facility], [facility]   Overall Financial Resource Strain (CARDIA)    Difficulty of Paying Living Expenses: Not hard at all  Food Insecurity: No Food Insecurity (08/19/2021)   Received from [facility], [facility]   Hunger Vital Sign    Worried About Running Out of Food in the Last Year: Never true    Ran Out of Food in the Last Year: Never true  Transportation Needs: No Transportation Needs (07/11/2021)   Received from [facility], [facility]   PRAPARE - Transportation    Lack of Transportation (Medical): No    Lack of  Transportation (Non-Medical): No  Physical Activity: Not on file  Stress: No Stress Concern Present (09/04/2022)   Received from [facility], [facility] - Occupational Stress Questionnaire    Feeling of Stress : Only a little  Social Connections: Unknown (11/01/2021)   Received from [facility], [facility]   Social Network    Social Network: Not on file    Allergies  Allergen Reactions   Brimonidine Tartrate Shortness Of Breath    Alphagan-Shortness of breath   Brinzolamide Shortness Of Breath    AZOPT- Shortness of breath   Latanoprost Shortness Of Breath    XALATAN- Shortness of breath   Nucynta [Tapentadol] Shortness Of Breath   Sulfa Antibiotics Palpitations   Timolol Maleate Shortness Of Breath and Other (See Comments)    TIMOPTIC- Aggravated asthma   Diltiazem Swelling     leg swelling   Rofecoxib Swelling     VIOXX- leg swelling   Vancomycin Rash and Other (See Comments)    Blisters   Codeine Other (See Comments)    Childhood reaction   Tamsulosin Other (See Comments)    Dizziness    Celecoxib Other (See Comments)    CELLBREX-confusion   Colchicine Diarrhea    diarrhea   Tape Rash     Current Outpatient Medications:    albuterol (VENTOLIN HFA) 108 (90 Base) MCG/ACT inhaler, TAKE 2 PUFFS BY MOUTH EVERY 6 HOURS  AS NEEDED FOR WHEEZE OR SHORTNESS OF BREATH (Patient not taking: Reported on 03/09/2023), Disp: 18 g, Rfl: 1   allopurinol (ZYLOPRIM) 300 MG tablet, TAKE 1 TABLET (300 MG TOTAL) BY MOUTH DAILY. WITH FOOD, Disp: 90 tablet, Rfl: 1   amoxicillin (AMOXIL) 500 MG capsule, TAKE 2 CAPSULES BY MOUTH TWICE A DAY, Disp: 120 capsule, Rfl: 11   apixaban (ELIQUIS) 5 MG TABS tablet, Take 5 mg by mouth 2 (two) times daily., Disp: , Rfl:    ASPIRIN LOW DOSE 81 MG EC tablet, Take 81 mg by mouth daily., Disp: , Rfl:    B Complex-C (B-COMPLEX WITH VITAMIN C) tablet, Take 1 tablet by mouth daily. Take 1 tablet daily, 250mg , Disp:  , Rfl:    cephALEXin (KEFLEX) 500 MG capsule, Take 1 capsule (500 mg total) by mouth 3 (three) times daily. (Patient not taking: Reported on 03/09/2023), Disp: 30 capsule, Rfl: 0   ezetimibe (ZETIA) 10 MG tablet, Take 1 tablet (10 mg total) by mouth daily., Disp: 30 tablet, Rfl: 1   ferrous gluconate (FERGON) 324 MG tablet, Take 1 tablet (324 mg total) by mouth 2 (two) times daily., Disp: 180 tablet, Rfl: 1   finasteride (PROSCAR) 5 MG tablet, Take 1 tablet (5 mg total) by mouth daily., Disp: 90 tablet, Rfl: 1   fluticasone-salmeterol (WIXELA INHUB) 250-50 MCG/ACT AEPB, INHALE 1 PUFF BY MOUTH TWICE A DAY, Disp: 60 each, Rfl: 1   gabapentin (NEURONTIN) 800 MG tablet, TAKE 1 TABLET BY MOUTH 4 TIMES DAILY., Disp: 360 tablet, Rfl: 1   ipratropium (ATROVENT) 0.03 % nasal spray, 2 SPRAY EACH NOSTRIL EVERY 12 HOURS FOR RUNNY NOSE, Disp: 30 mL, Rfl: 2   Loratadine (CLARITIN PO), Take 1 tablet by mouth in the morning and at bedtime. (Patient not taking: Reported on 03/09/2023), Disp: , Rfl:    mometasone-formoterol (DULERA) 100-5 MCG/ACT AERO, Inhale 2 puffs into the lungs as needed for wheezing or shortness of breath. (Patient not taking: Reported on 03/09/2023), Disp: , Rfl:    Multiple Vitamin (MULTIVITAMIN ADULT PO), Take 1 tablet by mouth daily., Disp: , Rfl:    niacin (NIASPAN) 1000 MG CR tablet, TAKE 1 TABLET BY MOUTH TWICE A DAY, Disp: 180 tablet, Rfl: 3   nitroGLYCERIN (NITROSTAT) 0.4 MG SL tablet, Place 0.4 mg under the tongue every 5 (five) minutes as needed for chest pain. (Patient not taking: Reported on 03/09/2023), Disp: , Rfl:    Oxycodone HCl 20 MG TABS, 1 tab po bid prn severe pain (Patient not taking: Reported on 03/09/2023), Disp: 60 tablet, Rfl: 0   Oxymetazoline HCl (NASAL SPRAY NA), Place into the nose., Disp: , Rfl:    pantoprazole (PROTONIX) 40 MG tablet, Take 1 tablet (40 mg total) by mouth daily. (Patient taking differently: Take 40 mg by mouth 2 (two) times daily.), Disp: 180 tablet, Rfl: 1    potassium chloride SA (KLOR-CON M20) 20 MEQ tablet, Take 1 tablet (20 mEq total) by mouth daily., Disp: 90 tablet, Rfl: 1   Probiotic Product (ALIGN PO), Take by mouth daily. (Patient not taking: Reported on 03/09/2023), Disp: , Rfl:    promethazine (PHENERGAN) 12.5 MG tablet, Take 1 tablet (12.5 mg total) by mouth every 8 (eight) hours as needed for nausea or vomiting. (Patient not taking: Reported on 03/09/2023), Disp: 30 tablet, Rfl: 1   rosuvastatin (CRESTOR) 20 MG tablet, Take 40 mg by mouth., Disp: , Rfl:    vitamin C (ASCORBIC ACID) 250 MG tablet, Take 250 mg by mouth  daily., Disp: , Rfl:    VITAMIN D, CHOLECALCIFEROL, PO, Take 1 tablet by mouth daily. , Disp: , Rfl:    Review of Systems  Constitutional:  Negative for activity change, appetite change, chills, diaphoresis, fatigue, fever and unexpected weight change.  HENT:  Negative for congestion, rhinorrhea, sinus pressure, sneezing, sore throat and trouble swallowing.   Eyes:  Negative for photophobia and visual disturbance.  Respiratory:  Negative for cough, chest tightness, shortness of breath, wheezing and stridor.   Cardiovascular:  Negative for chest pain, palpitations and leg swelling.  Gastrointestinal:  Negative for abdominal distention, abdominal pain, anal bleeding, blood in stool, constipation, diarrhea, nausea and vomiting.  Genitourinary:  Negative for difficulty urinating, dysuria, flank pain and hematuria.  Musculoskeletal:  Negative for arthralgias, back pain, gait problem, joint swelling and myalgias.  Skin:  Negative for color change, pallor, rash and wound.  Neurological:  Negative for dizziness, tremors, weakness and light-headedness.  Hematological:  Negative for adenopathy. Does not bruise/bleed easily.  Psychiatric/Behavioral:  Negative for agitation, behavioral problems, confusion, decreased concentration, dysphoric mood and sleep disturbance.        Objective:   Physical Exam Constitutional:      Appearance:  He is well-developed.  HENT:     Head: Normocephalic and atraumatic.  Eyes:     Conjunctiva/sclera: Conjunctivae normal.  Cardiovascular:     Rate and Rhythm: Normal rate and regular rhythm.  Pulmonary:     Effort: Pulmonary effort is normal. No respiratory distress.     Breath sounds: No wheezing.  Abdominal:     General: There is no distension.     Palpations: Abdomen is soft.  Musculoskeletal:        General: No tenderness. Normal range of motion.     Cervical back: Normal range of motion and neck supple.  Skin:    General: Skin is warm and dry.     Coloration: Skin is not pale.     Findings: No erythema or rash.  Neurological:     General: No focal deficit present.     Mental Status: He is alert and oriented to person, place, and time.  Psychiatric:        Mood and Affect: Mood normal.        Behavior: Behavior normal.        Thought Content: Thought content normal.        Judgment: Judgment normal.       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  Plan on seeing him at the end of October.  If the swelling is worsening in his foot I would pursue imaging with CT of the foot.  Endocarditis with prosthetic valve endocarditis and ICD infection due to Enterococcus faecalis: He has had recurrence of infection but has done well with higher dose amoxicillin 1000 mg twice daily which she will continue I have sent in   Counseling  he has had his recent flu shot and wanted to get his COVID-19 vaccine which we also  recommended.

## 2023-03-30 LAB — BASIC METABOLIC PANEL WITH GFR
BUN: 14 mg/dL (ref 7–25)
CO2: 29 mmol/L (ref 20–32)
Calcium: 8.7 mg/dL (ref 8.6–10.3)
Chloride: 107 mmol/L (ref 98–110)
Creat: 0.73 mg/dL (ref 0.70–1.22)
Glucose, Bld: 134 mg/dL — ABNORMAL HIGH (ref 65–99)
Potassium: 4 mmol/L (ref 3.5–5.3)
Sodium: 142 mmol/L (ref 135–146)
eGFR: 90 mL/min/{1.73_m2} (ref >=60)

## 2023-03-30 LAB — C-REACTIVE PROTEIN: CRP: <3 mg/L (ref <8.0)

## 2023-05-03 ENCOUNTER — Encounter: Payer: Self-pay | Admitting: Infectious Disease

## 2023-05-03 ENCOUNTER — Other Ambulatory Visit: Payer: Self-pay

## 2023-05-03 ENCOUNTER — Ambulatory Visit: Payer: Medicare Other | Admitting: Infectious Disease

## 2023-05-03 VITALS — BP 112/70 | HR 77 | Temp 98.4°F

## 2023-05-03 DIAGNOSIS — T827XXD Infection and inflammatory reaction due to other cardiac and vascular devices, implants and grafts, subsequent encounter: Secondary | ICD-10-CM

## 2023-05-03 DIAGNOSIS — I330 Acute and subacute infective endocarditis: Secondary | ICD-10-CM

## 2023-05-03 DIAGNOSIS — T8744 Infection of amputation stump, left lower extremity: Secondary | ICD-10-CM | POA: Diagnosis not present

## 2023-05-03 DIAGNOSIS — T826XXD Infection and inflammatory reaction due to cardiac valve prosthesis, subsequent encounter: Secondary | ICD-10-CM

## 2023-05-03 DIAGNOSIS — I38 Endocarditis, valve unspecified: Secondary | ICD-10-CM

## 2023-05-03 DIAGNOSIS — Z7185 Encounter for immunization safety counseling: Secondary | ICD-10-CM

## 2023-05-03 DIAGNOSIS — M869 Osteomyelitis, unspecified: Secondary | ICD-10-CM

## 2023-05-03 DIAGNOSIS — M86372 Chronic multifocal osteomyelitis, left ankle and foot: Secondary | ICD-10-CM | POA: Diagnosis not present

## 2023-05-03 HISTORY — DX: Osteomyelitis, unspecified: M86.9

## 2023-05-03 NOTE — Progress Notes (Signed)
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   MRN: [id number]  HPI  82 y.o. male who  initially presented with AMP sensitive enterococcal UTI complicated by enterococcal bacteremia in March 2021 in the setting of AICD and TAVR. TTE and TEE at the time were without evidence of vegetation and was prescribed 2 weeks of ampicillin. On follow up on 4/21 there was worsening of his amputation site with erythema and drainage. He was placed on doxycycline by Dr. Lajoyce Corners which was then changed to Zyvox by me. This resolved his symptoms. On follow up with myself  on 11/26/2019 he was experiencing fevers and dizziness with concern for systemic infection. Blood cultures in the ID clinic were positive for Enterococcus faecalis bacteremia. Surgical site was well healed at that time. 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 Recommended treatment was 6 weeks of ampicillin and ceftriaxone through 6/21. Feeling better on 6/21 and transitioned to life-long suppression with amoxicillin three times daily.    Tim [name] was then seen on 7/15 /2021 seen by Dr. Ninetta Lights with concern for cellulitis and was continued on his Amoxicillin. He followed up with Dr. Ninetta Lights on 8/6 and continued to do well with Amoxicillin.   Blood cultures no growth but MV had vegetation. TEE was not successful.  He was retreated with ampicillin high-dose and ceftriaxone twice daily.  He completed 6 weeks of therapy and  was then on  high-dose amoxicillin to 1000 g twice daily.  Interim history :    "Followed very closely by Dr. Lajoyce Corners for his chronic surgical wound site where he had osteomyelitis in that area is been doing well according to him and also going to Dr. Audrie Lia notes.  He was then admitted to [facility] with infection of his foot having presented  with redness and drainage from the left TMA site. Blood cultures from admission were negative. CT of the left foot had cellulitis of the distal forefoot with abscess and acute osteomyelitis of the second third and fourth metatarsal stumps. There was also possible acute osteomyelitis of the first metatarsal stump.  On 1/12 he underwent I&D with revision of transmetatarsal amputation and bone biopsy.  Operative culture grew 1 colony of staph lugdinensis. There was concern for residual osteomyelitis  He was discharged to SNF on 1/22 on cefepime/flagyl with plans to continue through 2/22. He presented back to the ED 1/23 reporting inadequate care at the SNF. He was afebrile with WBC 7,300 and procalcitonin 0.16. He reported some queasiness and metallic taste in his mouth concerning for flagyl adverse events. He was changed to daptomycin and ertapenem to finish his course and sent back to the facility on 07/28/2021  He followed up with Dr. Lunette Stands. He was also was seen in followup with Dr.Taylor with Podiatry with [facility] and later with Dr. Lajoyce Corners here in [place] who has been happy with his progress. After finishing his  IV antibiotics he went back to chronic high dose suppressive therapy with amoxicillin 1 gram po bid.   Tim [name] was  then admitted the hospital and was treated for possible cellulitis in the context of superimposed venous stasis changes.  He was given Zyvox IV and then a supply of oral Zyvox which was then extended by his primary care physician.   Tim [name] has continued to follow closely Dr. Lajoyce Corners  who has performed local debridements on his foot.  Last saw Tim [name] in March.   Then followed in Dr. Audrie Lia office and recently seen [name] as recently as June 2024.   Time he was struggling with an early potential infection in the right leg which is resolved."  2 weeks prior to his last appointment with me in September he did experience swelling at the amputation site and was concerned about potential  recurrence of infection.  We checked inflammatory markers which were completely normal we also schedule a follow-up appointment with me this month.  He is doing well.  The edema is still is present may be a tad worse but the erythema at the site is no worse and he has no systemic symptoms concerning for infection       Past Medical History:  Diagnosis Date   AICD (automatic cardioverter/defibrillator) present    Balanitis    +severe phimosis+ buried penis->circ not possible but dorsal slit done 10/2019   BPH (benign prostatic hypertrophy)    with urinary retention.  Renal u/s 12/04/19 NORMAL KIDNEYS, NORMAL BLADDER, NO HYDRONEPHROSIS   CAD (coronary artery disease)    Nonobstructive by 12/2012 cath; then 03/2016 he required BMS to RCA ([facility]).  In-stent restenosis on cath 11/01/16, baloon angioplasty successful.   CATARACT, HX OF    Chronic combined systolic and diastolic CHF (congestive heart failure) (HCC) 05/2016   Ischemic CM.  04/2018 EF 40-45%, grd I DD.  07/2021 EF 55-60%, AV well seated   Chronic pain syndrome    Lumbar DDD; chronic neuropathic pain (DM); has spinal stimulator and sees pain mgmt MD   Complicated UTI (urinary tract infection) 09/2019   Phimoses, acute urinary retention, entoerococcus UTI, enterococc bacteremia.  F/u blood clx's neg x 5d.     COPD    Debilitated patient    Diabetes mellitus type 2 with complications (HCC)    HbA1c jumped from 5.7% to 6.1% 03/2015---started metformin at that time.  DM 2 dx by fasting gluc criteria 2018.  Has chronic neuropath pain   Enterococcal bacteremia 09/2019   Phimoses, acute urinary retention, entoerococcus UTI, enterococc bacteremia.  F/u blood clx's neg x 5d.     Essential hypertension, benign    Fatty liver 2007   2007 u/s showed fatty liver with hepatosplenomegaly.  2019 repeat u/s->fatty liver but no cirrhosis or hepatosplenomegaly.   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 Daiva Eves)   Iron deficiency anemia    09/2022.  Hemoccults x 3 neg Mar 2024.  Improving with oral iron. GI following, no endoscopies   Lumbosacral neuritis    Lumbosacral spondylosis    Lumbar spinal stenosis with neurogenic claudication--contributes to his chronic pain syndrome   Morbid obesity (HCC)    Normal memory function 08/2014   Neuropsychological testing ([facility]): no cognitive impairment or sign of neurodegenerative disorder.  Likely has adjustment d/o with mixed anxiety/depressed features and may benefit from low dose SNRI.     Normocytic anemia 03/2016   Mild-pt needs ferritin and vit B12 level checked (as of 03/22/16). Hb stable 09/2019.  Hemoccult x  3 NEG 03/2022.  Hemoccults NEG x 03 Sep 2022   NSTEMI (non-ST elevated myocardial infarction) (HCC) 03/20/2016   BMS to RCA   Obesity hypoventilation syndrome (HCC)    Orthostatic hypotension    OSA on CPAP    8 cm H2O   OSTEOARTHRITIS    Paroxysmal atrial fibrillation (HCC) 2003    (? chronic?) Off anticoag for a while due to falls.  Then apixaban started 12/2014.   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 Larna Daughters): neuropathic pain->diff to treat, failed nucynta, failed spinal stimul trial, oxycontin hs + tramadol + gabap as of 12/2017 f/u Dr. Wynn Banker.   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   Sick sinus syndrome (HCC)    PPM placed   Thrombocytopenia (HCC) 2018   HSM on 2007 abd u/s---suspect some mild splenic sequestration chronically.   Unspecified glaucoma(365.9)    Unspecified hereditary and idiopathic peripheral neuropathy approx age 61   bilat LE's, ? left arm, too.  Feet became progressively numb + left foot pain intermittently.  Pt may be trying a spinal stimulator (as of 05/2015)   Vaccine counseling 11/16/2021   VENOUS INSUFFICIENCY    Being followed by Dr. Lajoyce Corners as of 10/2016 for two R LL venous stasis ulcers/skin tears.  Healed as of 10/30/16 f/u with Dr. Lajoyce Corners.   Venous stasis dermatitis 12/27/2021   VENTRAL HERNIA     Past Surgical History:  Procedure Laterality Date   AMPUTATION Left 04/11/2013   Procedure: AMPUTATION DIGIT Left 3rd toe;  Surgeon: Nadara Mustard, MD;  Location: [facility] OR;  Service: Orthopedics;  Laterality: Left;  Left 3rd toe amputation at [facility]   AMPUTATION Left 08/29/2019   Procedure: LEFT TRANSMETATARSAL AMPUTATION;  Surgeon: Nadara Mustard, MD;  Location[facility] OR;  Service: Orthopedics;  Laterality: Left;   BIOPSY  05/04/2020   Procedure: BIOPSY;  Surgeon: Lynann Bologna, MD;  Location: [facility] ENDOSCOPY;  Service: Endoscopy;;   CARDIAC CATHETERIZATION  1997; 03/10/16   1997 Non-obstructive disease.  03/2016 BMS to RCA, with 25% pDiag dz, o/w normal cors per cath 03/07/16.  Cath 11/01/16: in stent restenosis, successful baloon angioplasty. 08/2020 branch vessel dz, cont medical therapy rec'd.   CARDIAC CATHETERIZATION  12/24/2012   mild < 20% LCx,  prox 30% RCA; LVEF 55-65% , moderate pulmonary HTN, moderate AS   CARDIAC DEFIBRILLATOR PLACEMENT  11/07/2017   Claria MRI Quad CRT defibrillator   CARDIOVASCULAR STRESS TEST  05/11/16 ([facility])   2017 Myocardial perfusion imaging:  No ischemia; scar in apex, global hypokinesis, EF 36%.  06/15/20->mod primarily fixed inferolat wall defect mildly worse with stress c/w infarct/scar with mild peri-infarct ischemia, normal EF-->for cath per cards.   Carotid dopplers  03/09/2016   [facility]: no hemodynamically significant stenosis  on either side.   CHOLECYSTECTOMY     COLONOSCOPY [name] 10/30/2012   Procedure: COLONOSCOPY;  Surgeon: Iva Boop, MD;  Location: [facility] ENDOSCOPY;  Service: Endoscopy;  Laterality: [name];   CORONARY ANGIOPLASTY WITH STENT PLACEMENT  03/2016; 04/2017   2017-[facility]: BMS to RCA-pt was placed on Brilinta.  04/2017: DES to RCA.   DORSAL SLIT [name] 10/29/2019   for severe phimosis. Procedure: DORSAL SLIT;  Surgeon: Sebastian Ache, MD;  Location: [facility] ORS;  Service: Urology;  Laterality: [name];  45 MINS   ESOPHAGEAL DILATION  05/04/2020   Procedure: ESOPHAGEAL DILATION;  Surgeon: Lynann Bologna, MD;  Location: [facility] END[facility]COPY;  Service: Endoscopy;;   ESOPHAGOGASTRODUODENOSCOPY (EGD) WITH PROPOFOL [name] 05/04/2020   Procedure: ESOPHAGOGASTRODUODENOSCOPY (EGD) WITH PROPOFOL;  Surgeon: Lynann Bologna, MD;  Location: [facility] ENDOSCOPY;  Service: Endoscopy;  Laterality: [name];   EYE SURGERY Bilateral cataract   HEMORRHOID SURGERY     INTRAOCULAR LENS INSERTION Bilateral    KNEE SURGERY Right    LEFT AND RIGHT HEART CATHETERIZATION WITH CORONARY ANGIOGRAM [name] 12/24/2012   Procedure: LEFT AND RIGHT HEART CATHETERIZATION WITH CORONARY ANGIOGRAM;  Surgeon: Peter M Swaziland, MD;  Location: [facility] CATH LAB;  Service: Cardiovascular;  Laterality: [name];   LEG SURGERY Bilateral    lenghtening    PACEMAKER PLACEMENT  04/13/2016   2nd deg HB after TAVR, pt had DC MDT PPM placed.   SHOULDER ARTHROSCOPY  08/30/2011    Procedure: ARTHROSCOPY SHOULDER;  Surgeon: Nadara Mustard, MD;  Location: [facility] OR;  Service: Orthopedics;  Laterality: Right;  Right Shoulder Arthroscopy, Debridement, and Decompression   SPINAL CORD STIMULATOR INSERTION [name] 09/10/2015   Procedure: LUMBAR SPINAL CORD STIMULATOR INSERTION;  Surgeon: Odette Fraction, MD;  Location: [facility] NEURO ORS;  Service: Neurosurgery;  Laterality: [name];   TEE WITHOUT CARDIOVERSION [name] 09/16/2019   Procedure: TRANSESOPHAGEAL ECHOCARDIOGRAM (TEE);  Surgeon: Chilton Si, MD;  Location: [facility] ENDOSCOPY;  Service: Cardiovascular;  Laterality: [name];   TEE WITHOUT CARDIOVERSION [name] 12/02/2019   +vegetation on AVR and pacer lead in RV.  EF 55-60%, normal wall motion.  Valves function normal.  Procedure: TRANSESOPHAGEAL ECHOCARDIOGRAM (TEE);  Surgeon: Sande Rives, MD;  Location: [facility] ENDOSCOPY;  Service: Cardiovascular;  Laterality: [name];   TOE AMPUTATION Left    due to osteomyelitis.  R big toe surg due to osteoarth   TONSILLECTOMY     traeculectomy Left    eye   TRANSCATHETER AORTIC VALVE REPLACEMENT, TRANSFEMORAL  04/11/2016   TRANSESOPHAGEAL ECHOCARDIOGRAM  03/09/2016; 09/2019   [facility]: EF 55-60%, PFO seen with bi-directional shunting, no thrombus in appendage.  09/2019 ->no valvular vegetations. Small patent foramen ovale with predominantly left to right shunting across the interatrial septum.   TRANSTHORACIC ECHOCARDIOGRAM  01/2015; 01/2016; 05/18/16; 09/18/16, 05/2017, 08/2017   01/2015 No signif change in aortic stenosis (moderate).  01/2016 Severe LVH w/small LV cavity, EF 60-65%, grade I diast dysfxn.  05/2016 (s/p TAVR): EF 50-55%, grd I DD, biopros AV good.  08/2016--EF 50-55%, LV septal motion c/w conduction abnl, grd I DD,mild MS,bioprosth aortic valve well seated, w/trace AR. 05/2017 TTE EF 35%. 08/2017-EF 35%, mod diff hypokin LV, grd I DD, biopros AV good.    TRANSTHORACIC ECHOCARDIOGRAM  04/2018; 09/2019   04/2018: EF 40-45%, mod diffuse LV hypokin, grd I DD,  bioprosth AV well seated, no AS or AR. 09/2019 EF 60-65%, grd I DD, valves fine, including bioprosth AV. 04/29/20 (tech diff) EF 55-60%, grd I DD, vegetation on MV.  07/2021 EF 55-60% (s/p upgrade to  BiV PPM), AV well seated.   VITRECTOMY      Family History  Problem Relation Age of Onset   Hypertension Mother    Coronary artery disease Mother    Heart attack Mother    Neuropathy Mother    Pulmonary fibrosis Father        asbestosis      Social History   Socioeconomic History   Marital status: Married    Spouse name: Not on file   Number of children: 0   Years of education: 20   Highest education level: Not on file  Occupational History   Occupation: Public relations account executive    Comment: retired  Tobacco Use   Smoking status: Never    Passive exposure: Never   Smokeless tobacco: Never  Vaping Use   Vaping status: Never Used  Substance and Sexual Activity   Alcohol use: No    Alcohol/week: 0.0 standard drinks of alcohol   Drug use: No    Types: Oxycodone   Sexual activity: Not Currently  Other Topics Concern   Not on file  Social History Narrative   HSG, [name] - BS, [name] - MS-engineering, 2 years on PhD - [place]. Married - '65 - 53yrs/divorced; '76- 3 yrs/divorced; '92 . No children. 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    Lives with wife as of 2020. ACP/Living Will - Yes CPR; long-term Mechanical ventilation as long as he was able to cognate; ok for long term artificial nutrition. Precondition being able to cognate and not to have too much pain.    Social Determinants of Health   Financial Resource Strain: Low Risk  (04/12/2023)   Received from [facility]   Overall Financial Resource Strain (CARDIA)    Difficulty of Paying Living Expenses: Not very hard  Food Insecurity: No Food Insecurity (04/12/2023)   [facility]   Hunger Vital Sign    Worried About Running Out of Food in the Last Year: Never true    Ran Out of Food in the Last  Year: Never true  Transportation Needs: No Transportation Needs (04/12/2023)   Received from [name] - Transportation    Lack of Transportation (Medical): No    Lack of Transportation (Non-Medical): No  Physical Activity: Not on file  Stress: No Stress Concern Present (09/04/2022)   Received from [facility] - Occupational Stress Questionnaire    Feeling of Stress : Only a little  Social Connections: Unknown (11/01/2021)   Received from [facility]   Social Network    Social Network: Not on file    Allergies  Allergen Reactions   Brimonidine Tartrate Shortness Of Breath    Alphagan-Shortness of breath   Brinzolamide Shortness Of Breath    AZOPT- Shortness of breath   Latanoprost Shortness Of Breath    XALATAN- Shortness of breath   Nucynta [Tapentadol] Shortness Of Breath   Sulfa Antibiotics Palpitations   Timolol Maleate Shortness Of Breath and Other (See Comments)    TIMOPTIC- Aggravated asthma   Diltiazem Swelling     leg swelling   Rofecoxib Swelling     VIOXX- leg swelling   Vancomycin Rash and Other (See Comments)    Blisters   Codeine Other (See Comments)    Childhood reaction   Tamsulosin Other (See Comments)    Dizziness    Celecoxib Other (See Comments)    CELLBREX-confusion   Colchicine Diarrhea  diarrhea   Tape Rash     Current Outpatient Medications:    albuterol (VENTOLIN HFA) 108 (90 Base) MCG/ACT inhaler, TAKE 2 PUFFS BY MOUTH EVERY 6 HOURS AS NEEDED FOR WHEEZE OR SHORTNESS OF BREATH (Patient not taking: Reported on 03/29/2023), Disp: 18 g, Rfl: 1   allopurinol (ZYLOPRIM) 300 MG tablet, TAKE 1 TABLET (300 MG TOTAL) BY MOUTH DAILY. WITH FOOD, Disp: 90 tablet, Rfl: 1   amoxicillin (AMOXIL) 500 MG capsule, Take 2 capsules (1,000 mg total) by mouth 2 (two) times daily., Disp: 120 capsule, Rfl: 11   apixaban (ELIQUIS) 5 MG TABS tablet, Take 5 mg by mouth 2 (two)  times daily., Disp: , Rfl:    ASPIRIN LOW DOSE 81 MG EC tablet, Take 81 mg by mouth daily., Disp: , Rfl:    B Complex-C (B-COMPLEX WITH VITAMIN C) tablet, Take 1 tablet by mouth daily. Take 1 tablet daily, 250mg  (Patient not taking: Reported on 03/29/2023), Disp: , Rfl:    ezetimibe (ZETIA) 10 MG tablet, Take 1 tablet (10 mg total) by mouth daily., Disp: 30 tablet, Rfl: 1   ferrous gluconate (FERGON) 324 MG tablet, Take 1 tablet (324 mg total) by mouth 2 (two) times daily., Disp: 180 tablet, Rfl: 1   finasteride (PROSCAR) 5 MG tablet, Take 1 tablet (5 mg total) by mouth daily., Disp: 90 tablet, Rfl: 1   fluticasone-salmeterol (WIXELA INHUB) 250-50 MCG/ACT AEPB, INHALE 1 PUFF BY MOUTH TWICE A DAY, Disp: 60 each, Rfl: 1   gabapentin (NEURONTIN) 800 MG tablet, TAKE 1 TABLET BY MOUTH 4 TIMES DAILY., Disp: 360 tablet, Rfl: 1   ipratropium (ATROVENT) 0.03 % nasal spray, 2 SPRAY EACH NOSTRIL EVERY 12 HOURS FOR RUNNY NOSE, Disp: 30 mL, Rfl: 2   Loratadine (CLARITIN PO), Take 1 tablet by mouth in the morning and at bedtime. (Patient not taking: Reported on 03/09/2023), Disp: , Rfl:    mometasone-formoterol (DULERA) 100-5 MCG/ACT AERO, Inhale 2 puffs into the lungs as needed for wheezing or shortness of breath. (Patient not taking: Reported on 03/09/2023), Disp: , Rfl:    Multiple Vitamin (MULTIVITAMIN ADULT PO), Take 1 tablet by mouth daily., Disp: , Rfl:    niacin (NIASPAN) 1000 MG CR tablet, TAKE 1 TABLET BY MOUTH TWICE A DAY, Disp: 180 tablet, Rfl: 3   nitroGLYCERIN (NITROSTAT) 0.4 MG SL tablet, Place 0.4 mg under the tongue every 5 (five) minutes as needed for chest pain. (Patient not taking: Reported on 03/09/2023), Disp: , Rfl:    Oxycodone HCl 20 MG TABS, 1 tab po bid prn severe pain (Patient not taking: Reported on 03/09/2023), Disp: 60 tablet, Rfl: 0   Oxymetazoline HCl (NASAL SPRAY NA), Place into the nose., Disp: , Rfl:    pantoprazole (PROTONIX) 40 MG tablet, Take 1 tablet (40 mg total) by mouth daily.  (Patient taking differently: Take 40 mg by mouth 2 (two) times daily.), Disp: 180 tablet, Rfl: 1   potassium chloride SA (KLOR-CON M20) 20 MEQ tablet, Take 1 tablet (20 mEq total) by mouth daily., Disp: 90 tablet, Rfl: 1   Probiotic Product (ALIGN PO), Take by mouth daily. (Patient not taking: Reported on 03/09/2023), Disp: , Rfl:    promethazine (PHENERGAN) 12.5 MG tablet, Take 1 tablet (12.5 mg total) by mouth every 8 (eight) hours as needed for nausea or vomiting. (Patient not taking: Reported on 03/09/2023), Disp: 30 tablet, Rfl: 1   rosuvastatin (CRESTOR) 20 MG tablet, Take 20 mg by mouth., Disp: , Rfl:    vitamin  C (ASCORBIC ACID) 250 MG tablet, Take 250 mg by mouth daily., Disp: , Rfl:    VITAMIN D, CHOLECALCIFEROL, PO, Take 1 tablet by mouth daily. , Disp: , Rfl:    Review of Systems  Constitutional:  Negative for activity change, appetite change, chills, diaphoresis, fatigue, fever and unexpected weight change.  HENT:  Negative for congestion, rhinorrhea, sinus pressure, sneezing, sore throat and trouble swallowing.   Eyes:  Negative for photophobia and visual disturbance.  Respiratory:  Negative for cough, chest tightness, shortness of breath, wheezing and stridor.   Cardiovascular:  Negative for chest pain, palpitations and leg swelling.  Gastrointestinal:  Negative for abdominal distention, abdominal pain, anal bleeding, blood in stool, constipation, diarrhea, nausea and vomiting.  Genitourinary:  Negative for difficulty urinating, dysuria, flank pain and hematuria.  Musculoskeletal:  Negative for arthralgias, back pain, gait problem, joint swelling and myalgias.  Skin:  Negative for color change, pallor, rash and wound.  Neurological:  Negative for dizziness, tremors, weakness and light-headedness.  Hematological:  Negative for adenopathy. Does not bruise/bleed easily.  Psychiatric/Behavioral:  Negative for agitation, behavioral problems, confusion, decreased concentration, dysphoric  mood and sleep disturbance.        Objective:   Physical Exam Constitutional:      Appearance: He is well-developed.  HENT:     Head: Normocephalic and atraumatic.  Eyes:     Conjunctiva/sclera: Conjunctivae normal.  Cardiovascular:     Rate and Rhythm: Normal rate and regular rhythm.  Pulmonary:     Effort: Pulmonary effort is normal. No respiratory distress.     Breath sounds: No wheezing.  Abdominal:     General: There is no distension.     Palpations: Abdomen is soft.  Musculoskeletal:        General: No tenderness. Normal range of motion.     Cervical back: Normal range of motion and neck supple.  Skin:    General: Skin is warm and dry.     Coloration: Skin is not pale.     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  Neurological:     General: No focal deficit present.     Mental Status: He is alert and oriented to person, place, and time.  Psychiatric:        Mood and Affect: Mood normal.        Behavior: Behavior normal.        Thought Content: Thought content normal.        Judgment: Judgment normal.       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

## 2023-05-19 ENCOUNTER — Other Ambulatory Visit: Payer: Self-pay | Admitting: Family Medicine

## 2023-06-08 ENCOUNTER — Encounter: Payer: Self-pay | Admitting: Family Medicine

## 2023-06-08 ENCOUNTER — Ambulatory Visit: Payer: Medicare Other | Admitting: Family Medicine

## 2023-06-08 VITALS — BP 134/84 | HR 92 | Wt 278.2 lb

## 2023-06-08 DIAGNOSIS — D508 Other iron deficiency anemias: Secondary | ICD-10-CM

## 2023-06-08 DIAGNOSIS — E1142 Type 2 diabetes mellitus with diabetic polyneuropathy: Secondary | ICD-10-CM | POA: Diagnosis not present

## 2023-06-08 DIAGNOSIS — R600 Localized edema: Secondary | ICD-10-CM

## 2023-06-08 LAB — POCT GLYCOSYLATED HEMOGLOBIN (HGB A1C)
HbA1c POC (<> result, manual entry): 5.5 % (ref 4.0–5.6)
HbA1c, POC (controlled diabetic range): 5.5 % (ref 0.0–7.0)
HbA1c, POC (prediabetic range): 5.5 % — AB (ref 5.7–6.4)
Hemoglobin A1C: 5.5 % (ref 4.0–5.6)

## 2023-06-08 MED ORDER — OXYCODONE HCL 20 MG PO TABS: ORAL_TABLET | ORAL | 0 refills | Status: DC

## 2023-06-08 MED ORDER — NALOXONE HCL 4 MG/0.1ML NA LIQD: NASAL | 1 refills | Status: DC

## 2023-06-08 NOTE — Progress Notes (Signed)
OFFICE VISIT  06/08/2023  CC:  Chief Complaint  Patient presents with   Medical Management of Chronic Issues    Patient is a 82 y.o. male who presents for 60-month follow-up diabetes, anemia, and chronic bilateral lower extremity edema. A/P as of last visit: "1) DM, great control with diet only. POC Hba1c is 5.4% today.   2)  Iron deficiency anemia.  Suspect malabsorption, possibly due to use of high-dose PPI chronically. Hemoccults have been negative. GI is following him and have felt like endoscopy not necessary at this time. Tolerating oral iron very well. Hb 10.9 about 6 mo ago. Rpt CBC and iron panel today.   3) Chronic bilateral lower extremity edema: Multifactorial  (venous insufficiency/lymphedema, pulmonary hypertension, chronic congestive heart failure, immobility)--> this has been stable lately. Continue torsemide daily PRN. BMET today.   4) Extensive/complicated cardiac hx: CAD, SSS, PAF, ischemic cardiomyopathy, TAVR, hx prosthetic valve endocarditis.   All stable on last cardiology f/u 01/03/23. Cont crestor, eliquis, ASA, amoxil, zetia, and prn torsemide."  INTERIM HX: Feeling pretty well. Has been able to walk more lately. Feels like legs not swelling much lately. Takes torsemide only 1-2 times a month.  Pain in low back and feet waxes and wanes, finds gabapentin helpful and occ use of oxycodone 20mg  tab.   PMP AWARE reviewed today: most recent rx for oxycodone 20mg  was filled 02/10/22, # 60, rx by me. No red flags.  ROS as above, plus--> no fevers, no CP, no SOB, no wheezing, no cough, no dizziness, no HAs, no rashes, no melena/hematochezia.  No polyuria or polydipsia.  No myalgias or arthralgias.  No focal weakness, paresthesias, or tremors.  No acute vision or hearing abnormalities.  No dysuria or unusual/new urinary urgency or frequency.  No recent changes in lower legs. No n/v/d or abd pain.  No palpitations.     Past Medical History:  Diagnosis Date    AICD (automatic cardioverter/defibrillator) present    Balanitis    +severe phimosis+ buried penis->circ not possible but dorsal slit done 10/2019   BPH (benign prostatic hypertrophy)    with urinary retention.  Renal u/s 12/04/19 NORMAL KIDNEYS, NORMAL BLADDER, NO HYDRONEPHROSIS   CAD (coronary artery disease)    Nonobstructive by 12/2012 cath; then 03/2016 he required BMS to RCA ([facility]).  In-stent restenosis on cath 11/01/16, baloon angioplasty successful.   CATARACT, HX OF    Chronic combined systolic and diastolic CHF (congestive heart failure) (HCC) 05/2016   Ischemic CM.  04/2018 EF 40-45%, grd I DD.  07/2021 EF 55-60%, AV well seated   Chronic pain syndrome    Lumbar DDD; chronic neuropathic pain (DM); has spinal stimulator and sees pain mgmt MD   Complicated UTI (urinary tract infection) 09/2019   Phimoses, acute urinary retention, entoerococcus UTI, enterococc bacteremia.  F/u blood clx's neg x 5d.     COPD    Debilitated patient    Diabetes mellitus type 2 with complications (HCC)    HbA1c jumped from 5.7% to 6.1% 03/2015---started metformin at that time.  DM 2 dx by fasting gluc criteria 2018.  Has chronic neuropath pain   Enterococcal bacteremia 09/2019   Phimoses, acute urinary retention, entoerococcus UTI, enterococc bacteremia.  F/u blood clx's neg x 5d.     Essential hypertension, benign    Fatty liver 2007   2007 u/s showed fatty liver with hepatosplenomegaly.  2019 repeat u/s->fatty liver but no cirrhosis or hepatosplenomegaly.   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 Daiva Eves)   Iron deficiency anemia    09/2022.   Hemoccults x 3 neg Mar 2024.  Improving with oral iron. GI following, no endoscopies   Lumbosacral neuritis    Lumbosacral spondylosis    Lumbar spinal stenosis with neurogenic claudication--contributes to his chronic pain syndrome   Morbid obesity (HCC)    Normal memory function 08/2014   Neuropsychological testing ([facility]): no cognitive impairment or sign of neurodegenerative disorder.  Likely has adjustment d/o with mixed anxiety/depressed features and may benefit from low dose SNRI.     Normocytic anemia 03/2016   Mild-pt needs ferritin and vit B12 level checked (as of 03/22/16). Hb stable 09/2019.  Hemoccult x 3 NEG 03/2022.  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 chronic?) Off anticoag for a while due to falls.  Then apixaban started 12/2014.   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 Larna Daughters): neuropathic pain->diff to treat, failed nucynta, failed spinal stimul trial, oxycontin hs + tramadol + gabap as of 12/2017 f/u Dr. Wynn Banker.   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   Sick sinus syndrome (HCC)    PPM placed   Thrombocytopenia (HCC) 2018   HSM on 2007 abd u/s---suspect some mild splenic sequestration chronically.   Unspecified glaucoma(365.9)    Unspecified hereditary and idiopathic peripheral neuropathy approx age 64   bilat LE's, ? left arm, too.  Feet became progressively numb + left foot pain intermittently.  Pt may be trying a spinal stimulator (as of 05/2015)   Vaccine counseling 11/16/2021   VENOUS INSUFFICIENCY    Being followed by Dr. Lajoyce Corners as of 10/2016 for two R LL venous stasis ulcers/skin tears.  Healed as of 10/30/16 f/u with Dr. Lajoyce Corners.   Venous stasis dermatitis 12/27/2021   VENTRAL HERNIA     Past Surgical History:  Procedure Laterality Date   AMPUTATION Left 04/11/2013   Procedure: AMPUTATION DIGIT Left 3rd toe;  Surgeon: Nadara Mustard, MD;  Location: [facility] OR;  Service: Orthopedics;  Laterality: Left;  Left 3rd toe amputation  at [facility]   AMPUTATION Left 08/29/2019   Procedure: LEFT TRANSMETATARSAL AMPUTATION;  Surgeon: Nadara Mustard, MD;  Location: [name] OR;  Service: Orthopedics;  Laterality: Left;   BIOPSY  05/04/2020   Procedure: BIOPSY;  Surgeon: Lynann Bologna, MD;  Location: [facility] ENDOSCOPY;  Service: Endoscopy;;   CARDIAC CATHETERIZATION  1997; 03/10/16   1997 Non-obstructive disease.  03/2016 BMS to RCA, with 25% pDiag dz, o/w normal cors per cath 03/07/16.  Cath 11/01/16: in stent restenosis, successful baloon angioplasty. 08/2020 branch vessel dz, cont medical therapy rec'd.   CARDIAC CATHETERIZATION  12/24/2012   mild < 20% LCx, prox 30% RCA; LVEF 55-65% , moderate pulmonary HTN, moderate AS   CARDIAC DEFIBRILLATOR PLACEMENT  11/07/2017   [facility] MRI Quad CRT defibrillator   CARDIOVASCULAR STRESS TEST  05/11/16 ([facility])   2017 Myocardial perfusion imaging:  No ischemia; scar in apex, global hypokinesis, EF 36%.  06/15/20->mod primarily fixed inferolat  wall defect mildly worse with stress c/w infarct/scar with mild peri-infarct ischemia, normal EF-->for cath per cards.   Carotid dopplers  03/09/2016   [facility]: no hemodynamically significant stenosis on either side.   CHOLECYSTECTOMY     COLONOSCOPY [name] 10/30/2012   Procedure: COLONOSCOPY;  Surgeon: Iva Boop, MD;  Location: [facility] ENDOSCOPY;  Service: Endoscopy;  Laterality: [name];   CORONARY ANGIOPLASTY WITH STENT PLACEMENT  03/2016; 04/2017   2017-[facility]: BMS to RCA-pt was placed on Brilinta.  04/2017: DES to RCA.   DORSAL SLIT [name] 10/29/2019   for severe phimosis. Procedure: DORSAL SLIT;  Surgeon: Sebastian Ache, MD;  Location: [facility] ORS;  Service: Urology;  Laterality: [name];  45 MINS   ESOPHAGEAL DILATION  05/04/2020   Procedure: ESOPHAGEAL DILATION;  Surgeon: Lynann Bologna, MD;  Location: [facility] ENDOSCOPY;  Service: Endoscopy;;   ESOPHAGOGASTRODUODENOSCOPY (EGD) WITH PROPOFOL [name] 05/04/2020   Procedure: ESOPHAGOGASTRODUODENOSCOPY (EGD) WITH PROPOFOL;  Surgeon: Lynann Bologna, MD;  Location: [facility] ENDOSCOPY;  Service: Endoscopy;  Laterality: [name];   EYE SURGERY Bilateral cataract   HEMORRHOID SURGERY     INTRAOCULAR LENS INSERTION Bilateral    KNEE SURGERY Right    LEFT AND RIGHT HEART CATHETERIZATION WITH CORONARY ANGIOGRAM [name] 12/24/2012   Procedure: LEFT AND RIGHT HEART CATHETERIZATION WITH CORONARY ANGIOGRAM;  Surgeon: Peter M Swaziland, MD;  Location: [facility] CATH LAB;  Service: Cardiovascular;  Laterality: [name];   LEG SURGERY Bilateral    lenghtening    PACEMAKER PLACEMENT  04/13/2016   2nd deg HB after TAVR, pt had DC MDT PPM placed.   SHOULDER ARTHROSCOPY  08/30/2011   Procedure: ARTHROSCOPY SHOULDER;  Surgeon: Nadara Mustard, MD;  Location: [facility] OR;  Service: Orthopedics;  Laterality: Right;  Right Shoulder Arthroscopy, Debridement, and Decompression   SPINAL CORD STIMULATOR INSERTION [name] 09/10/2015   Procedure: LUMBAR SPINAL CORD STIMULATOR INSERTION;  Surgeon: Odette Fraction, MD;  Location: [facility] NEURO  ORS;  Service: Neurosurgery;  Laterality: [name];   TEE WITHOUT CARDIOVERSION [name] 09/16/2019   Procedure: TRANSESOPHAGEAL ECHOCARDIOGRAM (TEE);  Surgeon: Chilton Si, MD;  Location: [facility] ENDOSCOPY;  Service: Cardiovascular;  Laterality: [name];   TEE WITHOUT CARDIOVERSION [name] 12/02/2019   +vegetation on AVR and pacer lead in RV.  EF 55-60%, normal wall motion.  Valves function normal.  Procedure: TRANSESOPHAGEAL ECHOCARDIOGRAM (TEE);  Surgeon: Sande Rives, MD;  Location: [facility] ENDOSCOPY;  Service: Cardiovascular;  Laterality: [name];   TOE AMPUTATION Left    due to osteomyelitis.  R big toe surg due to osteoarth   TONSILLECTOMY     traeculectomy Left  eye   TRANSCATHETER AORTIC VALVE REPLACEMENT, TRANSFEMORAL  04/11/2016   TRANSESOPHAGEAL ECHOCARDIOGRAM  03/09/2016; 09/2019   [facility]: EF 55-60%, PFO seen with bi-directional shunting, no thrombus in appendage.  09/2019 ->no valvular vegetations. Small patent foramen ovale with predominantly left to right shunting across the interatrial septum.   TRANSTHORACIC ECHOCARDIOGRAM  01/2015; 01/2016; 05/18/16; 09/18/16, 05/2017, 08/2017   01/2015 No signif change in aortic stenosis (moderate).  01/2016 Severe LVH w/small LV cavity, EF 60-65%, grade I diast dysfxn.  05/2016 (s/p TAVR): EF 50-55%, grd I DD, biopros AV good.  08/2016--EF 50-55%, LV septal motion c/w conduction abnl, grd I DD,mild MS,bioprosth aortic valve well seated, w/trace AR. 05/2017 TTE EF 35%. 08/2017-EF 35%, mod diff hypokin LV, grd I DD, biopros AV good.    TRANSTHORACIC ECHOCARDIOGRAM  04/2018; 09/2019   04/2018: EF 40-45%, mod diffuse LV hypokin, grd I DD, bioprosth AV well seated, no AS or AR. 09/2019 EF 60-65%, grd I DD, valves fine, including bioprosth AV. 04/29/20 (tech diff) EF 55-60%, grd I DD, vegetation on MV.  07/2021 EF 55-60% (s/p upgrade to BiV PPM), AV well seated.   VITRECTOMY      Outpatient Medications Prior to Visit  Medication Sig Dispense Refill   allopurinol (ZYLOPRIM)  300 MG tablet TAKE 1 TABLET (300 MG TOTAL) BY MOUTH DAILY. WITH FOOD 90 tablet 1   amoxicillin (AMOXIL) 500 MG capsule Take 2 capsules (1,000 mg total) by mouth 2 (two) times daily. 120 capsule 11   apixaban (ELIQUIS) 5 MG TABS tablet Take 5 mg by mouth 2 (two) times daily.     ASPIRIN LOW DOSE 81 MG EC tablet Take 81 mg by mouth daily.     ezetimibe (ZETIA) 10 MG tablet Take 1 tablet (10 mg total) by mouth daily. 30 tablet 1   ferrous gluconate (FERGON) 324 MG tablet Take 1 tablet (324 mg total) by mouth 2 (two) times daily. 180 tablet 1   finasteride (PROSCAR) 5 MG tablet Take 1 tablet (5 mg total) by mouth daily. 90 tablet 1   fluticasone-salmeterol (WIXELA INHUB) 250-50 MCG/ACT AEPB INHALE 1 PUFF BY MOUTH TWICE A DAY 60 each 1   gabapentin (NEURONTIN) 800 MG tablet TAKE 1 TABLET BY MOUTH 4 TIMES DAILY. 360 tablet 1   ipratropium (ATROVENT) 0.03 % nasal spray 2 SPRAY EACH NOSTRIL EVERY 12 HOURS FOR RUNNY NOSE 30 mL 2   Loratadine (CLARITIN PO) Take 1 tablet by mouth in the morning and at bedtime.     Multiple Vitamin (MULTIVITAMIN ADULT PO) Take 1 tablet by mouth daily.     niacin (NIASPAN) 1000 MG CR tablet TAKE 1 TABLET BY MOUTH TWICE A DAY 180 tablet 3   Oxycodone HCl 20 MG TABS 1 tab po bid prn severe pain 60 tablet 0   Oxymetazoline HCl (NASAL SPRAY NA) Place into the nose.     pantoprazole (PROTONIX) 40 MG tablet Take 1 tablet (40 mg total) by mouth daily. (Patient taking differently: Take 40 mg by mouth 2 (two) times daily.) 180 tablet 1   potassium chloride SA (KLOR-CON M20) 20 MEQ tablet Take 1 tablet (20 mEq total) by mouth daily. 90 tablet 1   rosuvastatin (CRESTOR) 20 MG tablet Take 20 mg by mouth.     vitamin C (ASCORBIC ACID) 250 MG tablet Take 250 mg by mouth daily.     VITAMIN D, CHOLECALCIFEROL, PO Take 1 tablet by mouth daily.      albuterol (VENTOLIN HFA) 108 (90 Base) MCG/ACT  inhaler TAKE 2 PUFFS BY MOUTH EVERY 6 HOURS AS NEEDED FOR WHEEZE OR SHORTNESS OF BREATH (Patient  not taking: Reported on 03/29/2023) 18 g 1   B Complex-C (B-COMPLEX WITH VITAMIN C) tablet Take 1 tablet by mouth daily. Take 1 tablet daily, 250mg  (Patient not taking: Reported on 03/29/2023)     mometasone-formoterol (DULERA) 100-5 MCG/ACT AERO Inhale 2 puffs into the lungs as needed for wheezing or shortness of breath. (Patient not taking: Reported on 03/09/2023)     nitroGLYCERIN (NITROSTAT) 0.4 MG SL tablet Place 0.4 mg under the tongue every 5 (five) minutes as needed for chest pain. (Patient not taking: Reported on 03/09/2023)     Probiotic Product (ALIGN PO) Take by mouth daily. (Patient not taking: Reported on 03/09/2023)     promethazine (PHENERGAN) 12.5 MG tablet Take 1 tablet (12.5 mg total) by mouth every 8 (eight) hours as needed for nausea or vomiting. (Patient not taking: Reported on 03/09/2023) 30 tablet 1   No facility-administered medications prior to visit.    Allergies  Allergen Reactions   Brimonidine Tartrate Shortness Of Breath    Alphagan-Shortness of breath   Brinzolamide Shortness Of Breath    AZOPT- Shortness of breath   Latanoprost Shortness Of Breath    XALATAN- Shortness of breath   Nucynta [Tapentadol] Shortness Of Breath   Sulfa Antibiotics Palpitations   Timolol Maleate Shortness Of Breath and Other (See Comments)    TIMOPTIC- Aggravated asthma   Diltiazem Swelling     leg swelling   Rofecoxib Swelling     VIOXX- leg swelling   Vancomycin Rash and Other (See Comments)    Blisters   Codeine Other (See Comments)    Childhood reaction   Tamsulosin Other (See Comments)    Dizziness    Celecoxib Other (See Comments)    CELLBREX-confusion   Colchicine Diarrhea    diarrhea   Tape Rash    Review of Systems As per HPI  PE:    06/08/2023    3:01 PM 05/03/2023    4:39 PM 03/29/2023    1:55 PM  Vitals with BMI  Weight 278 lbs 3 oz    Systolic 134 112 086  Diastolic 84 70 78  Pulse 92 77 82     Physical Exam  Gen: Alert, well appearing.  Patient is  oriented to person, place, time, and situation. AFFECT: pleasant, lucid thought and speech. CV: RRR (rate 100), distant S1 and S2, no murmur LUNGS: CTA bilat, non-labored resps EXT: 1+ bilat LE pitting edema  LABS:  Last CBC Lab Results  Component Value Date   WBC 5.8 03/29/2023   HGB 12.8 (L) 03/29/2023   HCT 39.5 03/29/2023   MCV 95.0 03/29/2023   MCH 30.8 03/29/2023   RDW 13.1 03/29/2023   PLT 131 (L) 03/29/2023   Lab Results  Component Value Date   IRON 107 03/09/2023   TIBC 274 03/09/2023   FERRITIN 24 03/09/2023   Last metabolic panel Lab Results  Component Value Date   GLUCOSE 134 (H) 03/29/2023   NA 142 03/29/2023   K 4.0 03/29/2023   CL 107 03/29/2023   CO2 29 03/29/2023   BUN 14 03/29/2023   CREATININE 0.73 03/29/2023   EGFR 90 03/29/2023   CALCIUM 8.7 03/29/2023   PROT 5.0 (L) 12/08/2022   ALBUMIN 2.8 (L) 12/15/2021   BILITOT 0.6 12/08/2022   ALKPHOS 49 12/15/2021   AST 41 (H) 12/08/2022   ALT 28 12/08/2022   ANIONGAP 10  12/15/2021   Last lipids Lab Results  Component Value Date   CHOL 115 02/10/2022   HDL 58 02/10/2022   LDLCALC 43 02/10/2022   TRIG 65 02/10/2022   CHOLHDL 2.0 02/10/2022   Last hemoglobin A1c Lab Results  Component Value Date   HGBA1C 5.4 03/09/2023   HGBA1C 5.4 03/09/2023   HGBA1C 5.4 (A) 03/09/2023   HGBA1C 5.4 03/09/2023   Last thyroid functions Lab Results  Component Value Date   TSH 0.905 04/29/2020   T3TOTAL 89 09/16/2018   Last vitamin B12 and Folate Lab Results  Component Value Date   VITAMINB12 429 05/24/2016   IMPRESSION AND PLAN:  1) DM, great control with diet only. POC Hba1c is 5.5% today.   2)  Iron deficiency anemia.  Suspect malabsorption, possibly due to use of high-dose PPI chronically. Hemoccults have been negative. GI is following him and have felt like endoscopy not necessary at this time. Tolerating oral iron very well. Hb improved to 12.8 about 3 mo ago. Rpt CBC and iron panel  today.   3) Chronic bilateral lower extremity edema: Multifactorial  (venous insufficiency/lymphedema, pulmonary hypertension, chronic congestive heart failure, immobility)--> this has been stable lately. Continue torsemide daily PRN. BMET today.   4) Extensive/complicated cardiac hx: CAD, SSS, PAF, ischemic cardiomyopathy, TAVR, hx prosthetic valve endocarditis.   All stable on last cardiology f/u 01/03/23. Cont crestor, eliquis, ASA, amoxil, zetia, and prn torsemide  An After Visit Summary was printed and given to the patient.  FOLLOW UP: 3 mo  Signed:  Santiago Bumpers, MD           06/08/2023

## 2023-06-09 LAB — CBC
HCT: 39.7 % (ref 38.5–50.0)
Hemoglobin: 13.4 g/dL (ref 13.2–17.1)
MCH: 31.8 pg (ref 27.0–33.0)
MCHC: 33.8 g/dL (ref 32.0–36.0)
MCV: 94.3 fL (ref 80.0–100.0)
MPV: 10.4 fL (ref 7.5–12.5)
Platelets: 104 10*3/uL — ABNORMAL LOW (ref 140–400)
RBC: 4.21 10*6/uL (ref 4.20–5.80)
RDW: 13.3 % (ref 11.0–15.0)
WBC: 5.4 10*3/uL (ref 3.8–10.8)

## 2023-06-09 LAB — BASIC METABOLIC PANEL
BUN: 15 mg/dL (ref 7–25)
CO2: 28 mmol/L (ref 20–32)
Calcium: 8.9 mg/dL (ref 8.6–10.3)
Chloride: 106 mmol/L (ref 98–110)
Creat: 0.73 mg/dL (ref 0.70–1.22)
Glucose, Bld: 146 mg/dL — ABNORMAL HIGH (ref 65–99)
Potassium: 4.3 mmol/L (ref 3.5–5.3)
Sodium: 143 mmol/L (ref 135–146)

## 2023-06-09 LAB — IRON,TIBC AND FERRITIN PANEL
%SAT: 23 % (ref 20–48)
Ferritin: 22 ng/mL — ABNORMAL LOW (ref 24–380)
Iron: 62 ug/dL (ref 50–180)
TIBC: 265 ug/dL (ref 250–425)

## 2023-06-10 ENCOUNTER — Other Ambulatory Visit: Payer: Self-pay | Admitting: Family Medicine

## 2023-06-20 ENCOUNTER — Other Ambulatory Visit: Payer: Self-pay | Admitting: Family Medicine

## 2023-06-20 MED ORDER — FLUTICASONE-SALMETEROL 250-50 MCG/ACT IN AEPB: 1.0000 | INHALATION_SPRAY | Freq: Two times a day (BID) | RESPIRATORY_TRACT | 1 refills | Status: DC

## 2023-06-20 NOTE — Telephone Encounter (Signed)
Copied from CRM [id number]. Topic: Clinical - Medication Refill >> Jun 20, 2023  3:34 PM Isabell A wrote: Most Recent Primary Care Visit:  Provider: Jeoffrey Massed  Department: [facility]  Visit Type: OFFICE VISIT  Date: 06/08/2023  Medication: fluticasone-salmeterol (WIXELA INHUB) 250-50 MCG  Has the patient contacted their pharmacy? Yes (Agent: If no, request that the patient contact the pharmacy for the refill. If patient does not wish to contact the pharmacy document the reason why and proceed with request.) (Agent: If yes, when and what did the pharmacy advise?)  Spoke with pharmacy,   Is this the correct pharmacy for this prescription? Yes If no, delete pharmacy and type the correct one.  This is the patient's preferred pharmacy:  [phone] [place], [place] - [address] [place] [address] Phone: [phone] Fax: [phone]  Has the prescription been filled recently? Yes  Is the patient out of the medication? Yes  Has the patient been seen for an appointment in the last year OR does the patient have an upcoming appointment? Yes  Can we respond through MyChart? Yes  Agent: Please be advised that Rx refills may take up to 3 business days. We ask that you follow-up with your pharmacy.

## 2023-06-22 ENCOUNTER — Other Ambulatory Visit: Payer: Self-pay | Admitting: Family Medicine

## 2023-07-08 ENCOUNTER — Other Ambulatory Visit: Payer: Self-pay | Admitting: Family Medicine

## 2023-07-15 ENCOUNTER — Other Ambulatory Visit: Payer: Self-pay | Admitting: Family Medicine

## 2023-07-19 ENCOUNTER — Other Ambulatory Visit: Payer: Self-pay | Admitting: Family Medicine

## 2023-07-19 NOTE — Telephone Encounter (Signed)
Copied from CRM [id number]. Topic: Clinical - Medication Refill >> Jul 19, 2023  3:15 PM Lennart Pall wrote: Most Recent Primary Care Visit:  Provider: Jeoffrey Massed  Department: [facility]  Visit Type: OFFICE VISIT  Date: 06/08/2023  Medication: ***  Has the patient contacted their pharmacy?  (Agent: If no, request that the patient contact the pharmacy for the refill. If patient does not wish to contact the pharmacy document the reason why and proceed with request.) (Agent: If yes, when and what did the pharmacy advise?)  Is this the correct pharmacy for this prescription?  If no, delete pharmacy and type the correct one.  This is the patient's preferred pharmacy:  [phone] [place], [place] - [address] [place]  [place] [address] Phone: [phone] Fax: [phone]  [facility] [address] [place] [address] Phone: [phone] Fax: [phone]   Has the prescription been filled recently?   Is the patient out of the medication?   Has the patient been seen for an appointment in the last year OR does the patient have an upcoming appointment?   Can we respond through MyChart?   Agent: Please be advised that Rx refills may take up to 3 business days. We ask that you follow-up with your pharmacy.

## 2023-07-20 ENCOUNTER — Telehealth: Payer: Self-pay | Admitting: Family Medicine

## 2023-07-20 MED ORDER — OXYCODONE HCL 20 MG PO TABS: ORAL_TABLET | ORAL | 0 refills | Status: DC

## 2023-07-20 NOTE — Telephone Encounter (Unsigned)
Copied from CRM [id number]. Topic: Clinical - Prescription Issue >> Jul 20, 2023  1:28 PM Florestine Avers wrote: Reason for CRM: Patient called in to inquire of the status of his medication refill for Oxycodone HCl 20 MG TABS. Says he spoke to someone yesterday who assured him that it would be sent over yesterday and it was not. The patient is requesting it be filled before the weekend and a phone call to let him know that it has been sent over to the pharmacy.

## 2023-07-20 NOTE — Telephone Encounter (Signed)
Pt made aware med was sent in today.

## 2023-07-20 NOTE — Telephone Encounter (Signed)
[facility] [place] Next OV 09/06/23

## 2023-07-20 NOTE — Telephone Encounter (Signed)
Copied from CRM [id number]. Topic: Clinical - Medication Refill >> Jul 20, 2023  1:24 PM Florestine Avers wrote: Most Recent Primary Care Visit:  Provider: Jeoffrey Massed  Department: [facility]  Visit Type: OFFICE VISIT  Date: 06/08/2023  Medication: ***  Has the patient contacted their pharmacy?  (Agent: If no, request that the patient contact the pharmacy for the refill. If patient does not wish to contact the pharmacy document the reason why and proceed with request.) (Agent: If yes, when and what did the pharmacy advise?)  Is this the correct pharmacy for this prescription?  If no, delete pharmacy and type the correct one.  This is the patient's preferred pharmacy:  [phone] [place], [place] - [address] [place] [address] Phone: [phone] Fax: [phone]  [facility] [address] [place] [address] Phone: [phone] Fax: [phone]   Has the prescription been filled recently?   Is the patient out of the medication?   Has the patient been seen for an appointment in the last year OR does the patient have an upcoming appointment?   Can we respond through MyChart?   Agent: Please be advised that Rx refills may take up to 3 business days. We ask that you follow-up with your pharmacy.

## 2023-07-31 ENCOUNTER — Other Ambulatory Visit: Payer: Self-pay | Admitting: Family Medicine

## 2023-08-14 ENCOUNTER — Other Ambulatory Visit: Payer: Self-pay | Admitting: Family Medicine

## 2023-09-03 ENCOUNTER — Ambulatory Visit: Payer: Medicare Other | Admitting: Infectious Disease

## 2023-09-04 ENCOUNTER — Other Ambulatory Visit: Payer: Self-pay | Admitting: Family Medicine

## 2023-09-06 ENCOUNTER — Other Ambulatory Visit: Payer: Self-pay | Admitting: Family Medicine

## 2023-09-06 ENCOUNTER — Ambulatory Visit: Payer: Medicare Other | Admitting: Family Medicine

## 2023-09-06 MED ORDER — TORSEMIDE 20 MG PO TABS: 20.0000 mg | ORAL_TABLET | Freq: Two times a day (BID) | ORAL | 0 refills | Status: DC

## 2023-09-06 MED ORDER — OXYCODONE HCL 20 MG PO TABS: ORAL_TABLET | ORAL | 0 refills | Status: DC

## 2023-09-06 NOTE — Telephone Encounter (Signed)
 [facility] [place] Next OV 3/19

## 2023-09-06 NOTE — Telephone Encounter (Signed)
 Copied from CRM [id number]. Topic: Clinical - Medication Refill >> Sep 06, 2023  8:10 AM Gurney Maxin H wrote: Most Recent Primary Care Visit:  Provider: Jeoffrey Massed  Department: [facility]  Visit Type: OFFICE VISIT  Date: 06/08/2023  Medication: Oxycodone HCl 20 MG TABS,   Has the patient contacted their pharmacy? Yes, contact provider (Agent: If no, request that the patient contact the pharmacy for the refill. If patient does not wish to contact the pharmacy document the reason why and proceed with request.) (Agent: If yes, when and what did the pharmacy advise?)  Is this the correct pharmacy for this prescription? Yes If no, delete pharmacy and type the correct one.  This is the patient's preferred pharmacy:  [phone] [place], [place] - [address] [place] [address] Phone: [phone] Fax: [phone]     Has the prescription been filled recently? No  Is the patient out of the medication? No  Has the patient been seen for an appointment in the last year OR does the patient have an upcoming appointment? Yes  Can we respond through MyChart? No  Agent: Please be advised that Rx refills may take up to 3 business days. We ask that you follow-up with your pharmacy.

## 2023-09-06 NOTE — Telephone Encounter (Signed)
 Copied from CRM [id number]. Topic: Clinical - Medication Refill >> Sep 06, 2023  8:16 AM Marica Otter wrote: Most Recent Primary Care Visit:  Provider: Jeoffrey Massed  Department: [facility]  Visit Type: OFFICE VISIT  Date: 06/08/2023  Medication: Torsemide 20 mg  Has the patient contacted their pharmacy? Yes, contact provider (Agent: If no, request that the patient contact the pharmacy for the refill. If patient does not wish to contact the pharmacy document the reason why and proceed with request.) (Agent: If yes, when and what did the pharmacy advise?)  Is this the correct pharmacy for this prescription? Yes If no, delete pharmacy and type the correct one.  This is the patient's preferred pharmacy:  [phone] [place], [place] - [address] [place] [address] Phone: [phone] Fax: [phone]    Has the prescription been filled recently? No  Is the patient out of the medication? No  Has the patient been seen for an appointment in the last year OR does the patient have an upcoming appointment? Yes  Can we respond through MyChart? No  Agent: Please be advised that Rx refills may take up to 3 business days. We ask that you follow-up with your pharmacy.

## 2023-09-06 NOTE — Telephone Encounter (Signed)
 Copied from CRM [id number]. Topic: Clinical - Medication Refill >> Sep 06, 2023  8:14 AM Marica Otter wrote: Most Recent Primary Care Visit:  Provider: Jeoffrey Massed  Department: [facility]  Visit Type: OFFICE VISIT  Date: 06/08/2023  Medication: Torsemide 20 mg  Has the patient contacted their pharmacy? Yes, no refills (Agent: If no, request that the patient contact the pharmacy for the refill. If patient does not wish to contact the pharmacy document the reason why and proceed with request.) (Agent: If yes, when and what did the pharmacy advise?)  Is this the correct pharmacy for this prescription? Yes If no, delete pharmacy and type the correct one.  This is the patient's preferred pharmacy:  [phone] [place], [place] - [address] [place] [address] Phone: [phone] Fax: [phone]     Has the prescription been filled recently? No  Is the patient out of the medication? No  Has the patient been seen for an appointment in the last year OR does the patient have an upcoming appointment? Yes  Can we respond through MyChart? No  Agent: Please be advised that Rx refills may take up to 3 business days. We ask that you follow-up with your pharmacy.

## 2023-09-16 ENCOUNTER — Other Ambulatory Visit: Payer: Self-pay | Admitting: Family Medicine

## 2023-09-17 ENCOUNTER — Ambulatory Visit: Payer: Medicare Other | Admitting: Infectious Disease

## 2023-09-18 ENCOUNTER — Other Ambulatory Visit: Payer: Self-pay | Admitting: Family Medicine

## 2023-09-18 NOTE — Telephone Encounter (Signed)
 Pt has upcoming appt tomorrow 3/19

## 2023-09-19 ENCOUNTER — Ambulatory Visit: Payer: Self-pay

## 2023-09-19 ENCOUNTER — Ambulatory Visit: Admitting: Family Medicine

## 2023-09-19 NOTE — Progress Notes (Deleted)
 Virtual Visit via Video Note  I connected with Tim [name]  on 09/19/23 at  2:40 PM EDT by a video enabled telemedicine application and verified that I am speaking with the correct person using two identifiers.  Location patient: [place] Location provider:work or home office Persons participating in the virtual visit: patient, provider  I discussed the limitations and requested verbal permission for telemedicine visit. The patient expressed understanding and agreed to proceed.   HPI: 83 y/o male being seen today for depression. ***  ROS: See pertinent positives and negatives per HPI.  Past Medical History:  Diagnosis Date   AICD (automatic cardioverter/defibrillator) present    Balanitis    +severe phimosis+ buried penis->circ not possible but dorsal slit done 10/2019   BPH (benign prostatic hypertrophy)    with urinary retention.  Renal u/s 12/04/19 NORMAL KIDNEYS, NORMAL BLADDER, NO HYDRONEPHROSIS   CAD (coronary artery disease)    Nonobstructive by 12/2012 cath; then 03/2016 he required BMS to RCA ([facility]).  In-stent restenosis on cath 11/01/16, baloon angioplasty successful.   CATARACT, HX OF    Chronic combined systolic and diastolic CHF (congestive heart failure) (HCC) 05/2016   Ischemic CM.  04/2018 EF 40-45%, grd I DD.  07/2021 EF 55-60%, AV well seated   Chronic pain syndrome    Lumbar DDD; chronic neuropathic pain (DM); has spinal stimulator and sees pain mgmt MD   Complicated UTI (urinary tract infection) 09/2019   Phimoses, acute urinary retention, entoerococcus UTI, enterococc bacteremia.  F/u blood clx's neg x 5d.     COPD    Debilitated patient    Diabetes mellitus type 2 with complications (HCC)    HbA1c jumped from 5.7% to 6.1% 03/2015---started metformin at that time.  DM 2 dx by fasting gluc criteria 2018.  Has chronic neuropath pain   Enterococcal bacteremia 09/2019   Phimoses, acute urinary retention, entoerococcus UTI, enterococc bacteremia.  F/u blood clx's neg x 5d.      Essential hypertension, benign    Fatty liver 2007   2007 u/s showed fatty liver with hepatosplenomegaly.  2019 repeat u/s->fatty liver but no cirrhosis or hepatosplenomegaly.   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 Daiva Eves)   Iron deficiency anemia    09/2022.  Hemoccults x 3 neg Mar 2024.  Improving with oral iron. GI following, no endoscopies   Lumbosacral neuritis    Lumbosacral spondylosis    Lumbar spinal stenosis with neurogenic claudication--contributes to his chronic pain syndrome   Morbid obesity (HCC)    Normal memory function 08/2014   Neuropsychological testing ([facility]): no cognitive impairment or sign of neurodegenerative disorder.  Likely has adjustment d/o with mixed anxiety/depressed features and may benefit from low dose SNRI.     Normocytic anemia 03/2016   Mild-pt needs ferritin and vit B12 level checked (as of 03/22/16). Hb stable 09/2019.  Hemoccult x 3 NEG 03/2022.  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 chronic?) Off anticoag for a while due to falls.  Then apixaban started 12/2014.   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 Larna Daughters): neuropathic pain->diff to treat, failed nucynta, failed spinal stimul trial, oxycontin hs + tramadol + gabap as of 12/2017 f/u Dr. Wynn Banker.    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   Sick sinus syndrome (HCC)    PPM placed   Thrombocytopenia (HCC) 2018   HSM on 2007 abd u/s---suspect some mild splenic sequestration chronically.   Unspecified glaucoma(365.9)    Unspecified hereditary and idiopathic peripheral neuropathy approx age 49   bilat LE's, ? left arm, too.  Feet became progressively numb + left foot pain intermittently.  Pt may be trying a spinal stimulator (as of 05/2015)   Vaccine counseling 11/16/2021   VENOUS INSUFFICIENCY    Being followed by Dr. Lajoyce Corners as of 10/2016 for two R LL venous stasis ulcers/skin tears.  Healed as of 10/30/16 f/u with Dr. Lajoyce Corners.   Venous stasis dermatitis 12/27/2021   VENTRAL HERNIA     Past Surgical History:  Procedure Laterality Date   AMPUTATION Left 04/11/2013   Procedure: AMPUTATION DIGIT Left 3rd toe;  Surgeon: Nadara Mustard, MD;  Location: [facility] OR;  Service: Orthopedics;  Laterality: Left;  Left 3rd toe amputation at [facility]   AMPUTATION Left 08/29/2019   Procedure: LEFT TRANSMETATARSAL AMPUTATION;  Surgeon: Nadara Mustard, MD;  Location: [facility] OR;  Service: Orthopedics;  Laterality: Left;   BIOPSY  05/04/2020   Procedure: BIOPSY;  Surgeon: Lynann Bologna, MD;  Location: [facility] ENDOSCOPY;  Service: Endoscopy;;   CARDIAC  CATHETERIZATION  1997; 03/10/16   1997 Non-obstructive disease.  03/2016 BMS to RCA, with 25% pDiag dz, o/w normal cors per cath 03/07/16.  Cath 11/01/16: in stent restenosis, successful baloon angioplasty. 08/2020 branch vessel dz, cont medical therapy rec'd.   CARDIAC CATHETERIZATION  12/24/2012   mild < 20% LCx, prox 30% RCA; LVEF 55-65% , moderate pulmonary HTN, moderate AS   CARDIAC DEFIBRILLATOR PLACEMENT  11/07/2017   [facility] MRI Quad CRT defibrillator   CARDIOVASCULAR STRESS TEST  05/11/16 ([facility])   2017 Myocardial perfusion imaging:  No ischemia; scar in apex, global hypokinesis, EF 36%.  06/15/20->mod primarily fixed inferolat wall defect mildly worse with stress c/w infarct/scar with mild peri-infarct ischemia, normal EF-->for cath per cards.   Carotid dopplers  03/09/2016   [facility]: no hemodynamically significant stenosis on either side.   CHOLECYSTECTOMY     COLONOSCOPY [name] 10/30/2012   Procedure: COLONOSCOPY;  Surgeon: Iva Boop, MD;  Location: [facility] ENDOSCOPY;  Service: Endoscopy;  Laterality: [name];   CORONARY  ANGIOPLASTY WITH STENT PLACEMENT  03/2016; 04/2017   2017-[facility]: BMS to RCA-pt was placed on Brilinta.  04/2017: DES to RCA.   DORSAL SLIT [name] 10/29/2019   for severe phimosis. Procedure: DORSAL SLIT;  Surgeon: Sebastian Ache, MD;  Location: [facility] ORS;  Service: Urology;  Laterality: [name];  45 MINS   ESOPHAGEAL DILATION  05/04/2020   Procedure: ESOPHAGEAL DILATION;  Surgeon: Lynann Bologna, MD;  Location: [facility] ENDOSCOPY;  Service: Endoscopy;;   ESOPHAGOGASTRODUODENOSCOPY (EGD) WITH PROPOFOL [name] 05/04/2020   Procedure: ESOPHAGOGASTRODUODENOSCOPY (EGD) WITH PROPOFOL;  Surgeon: Lynann Bologna, MD;  Location: [facility] ENDOSCOPY;  Service: Endoscopy;  Laterality: [name];   EYE SURGERY Bilateral cataract   HEMORRHOID SURGERY     INTRAOCULAR LENS INSERTION Bilateral    KNEE SURGERY Right    LEFT AND RIGHT HEART CATHETERIZATION WITH CORONARY ANGIOGRAM [name] 12/24/2012   Procedure: LEFT AND RIGHT HEART  CATHETERIZATION WITH CORONARY ANGIOGRAM;  Surgeon: Peter M Sw[place]and, MD;  Location: [facility] CATH LAB;  Service: Cardiovascular;  Laterality: [name];   LEG SURGERY Bilateral    lenghtening    PACEMAKER PLACEMENT  04/13/2016   2nd deg HB after TAVR, pt had DC MDT PPM placed.   SHOULDER ARTHROSCOPY  08/30/2011   Procedure: ARTHROSCOPY SHOULDER;  Surgeon: Nadara Mustard, MD;  Location: [facility] OR;  Service: Orthopedics;  Laterality: Right;  Right Shoulder Arthroscopy, Debridement, and Decompression   SPINAL CORD STIMULATOR INSERTION [name] 09/10/2015   Procedure: LUMBAR SPINAL CORD STIMULATOR INSERTION;  Surgeon: Odette Fraction, MD;  Location: [facility] NEURO ORS;  Service: Neurosurgery;  Laterality: [name];   TEE WITHOUT CARDIOVERSION [name] 09/16/2019   Procedure: TRANSESOPHAGEAL ECHOCARDIOGRAM (TEE);  Surgeon: Chilton Si, MD;  Location: [name] ENDOSCOPY;  Service: Cardiovascular;  Laterality: [name];   TEE WITHOUT CARDIOVERSION [name] 12/02/2019   +vegetation on AVR and pacer lead in RV.  EF 55-60%, normal wall motion.  Valves function normal.  Procedure: TRANSESOPHAGEAL ECHOCARDIOGRAM (TEE);  Surgeon: Sande Rives, MD;  Location: [facility] ENDOSCOPY;  Service: Cardiovascular;  Laterality: [name];   TOE AMPUTATION Left    due to osteomyelitis.  R big toe surg due to osteoarth   TONSILLECTOMY     traeculectomy Left    eye   TRANSCATHETER AORTIC VALVE REPLACEMENT, TRANSFEMORAL  04/11/2016   TRANSESOPHAGEAL ECHOCARDIOGRAM  03/09/2016; 09/2019   [facility]: EF 55-60%, PFO seen with bi-directional shunting, no thrombus in appendage.  09/2019 ->no valvular vegetations. Small patent foramen ovale with predominantly left to right shunting across the interatrial septum.   TRANSTHORACIC ECHOCARDIOGRAM  01/2015; 01/2016; 05/18/16; 09/18/16, 05/2017, 08/2017   01/2015 No signif change in aortic stenosis (moderate).  01/2016 Severe LVH w/small LV cavity, EF 60-65%, grade I diast dysfxn.  05/2016 (s/p TAVR): EF 50-55%, grd I DD, biopros AV good.  08/2016--EF  50-55%, LV septal motion c/w conduction abnl, grd I DD,mild MS,bioprosth aortic valve well seated, w/trace AR. 05/2017 TTE EF 35%. 08/2017-EF 35%, mod diff hypokin LV, grd I DD, biopros AV good.    TRANSTHORACIC ECHOCARDIOGRAM  04/2018; 09/2019   04/2018: EF 40-45%, mod diffuse LV hypokin, grd I DD, bioprosth AV well seated, no AS or AR. 09/2019 EF 60-65%, grd I DD, valves fine, including bioprosth AV. 04/29/20 (tech diff) EF 55-60%, grd I DD, vegetation on MV.  07/2021 EF 55-60% (s/p upgrade to BiV PPM), AV well seated.   VITRECTOMY       Current Outpatient Medications:    allopurinol (ZYLOPRIM) 300 MG tablet, TAKE 1 TABLET (300 MG TOTAL) BY MOUTH DAILY. WITH FOOD,  Disp: 90 tablet, Rfl: 0   amoxicillin (AMOXIL) 500 MG capsule, Take 2 capsules (1,000 mg total) by mouth 2 (two) times daily., Disp: 120 capsule, Rfl: 11   apixaban (ELIQUIS) 5 MG TABS tablet, Take 5 mg by mouth 2 (two) times daily., Disp: , Rfl:    ASPIRIN LOW DOSE 81 MG EC tablet, Take 81 mg by mouth daily., Disp: , Rfl:    B Complex-C (B-COMPLEX WITH VITAMIN C) tablet, Take 1 tablet by mouth daily. Take 1 tablet daily, 250mg  (Patient not taking: Reported on 03/29/2023), Disp: , Rfl:    ezetimibe (ZETIA) 10 MG tablet, Take 1 tablet (10 mg total) by mouth daily., Disp: 30 tablet, Rfl: 1   ferrous gluconate (FERGON) 324 MG tablet, TAKE 1 TABLET BY MOUTH 2 TIMES DAILY., Disp: 180 tablet, Rfl: 0   finasteride (PROSCAR) 5 MG tablet, TAKE 1 TABLET (5 MG TOTAL) BY MOUTH DAILY., Disp: 90 tablet, Rfl: 0   gabapentin (NEURONTIN) 800 MG tablet, TAKE 1 TABLET BY MOUTH 4 TIMES DAILY., Disp: 360 tablet, Rfl: 1   ipratropium (ATROVENT) 0.03 % nasal spray, 2 SPRAY EACH NOSTRIL EVERY 12 HOURS FOR RUNNY NOSE, Disp: 30 mL, Rfl: 2   Loratadine (CLARITIN PO), Take 1 tablet by mouth in the morning and at bedtime., Disp: , Rfl:    mometasone-formoterol (DULERA) 100-5 MCG/ACT AERO, Inhale 2 puffs into the lungs as needed for wheezing or shortness of breath.  (Patient not taking: Reported on 03/09/2023), Disp: , Rfl:    Multiple Vitamin (MULTIVITAMIN ADULT PO), Take 1 tablet by mouth daily., Disp: , Rfl:    naloxone (NARCAN) nasal spray 4 mg/0.1 mL, One spray in nostril as needed for opoid overdose.  May repeat dose in 5 min if needed., Disp: 2 each, Rfl: 1   niacin (NIASPAN) 1000 MG CR tablet, TAKE 1 TABLET BY MOUTH TWICE A DAY, Disp: 180 tablet, Rfl: 3   nitroGLYCERIN (NITROSTAT) 0.4 MG SL tablet, Place 0.4 mg under the tongue every 5 (five) minutes as needed for chest pain. (Patient not taking: Reported on 03/09/2023), Disp: , Rfl:    Oxycodone HCl 20 MG TABS, 1 tab po bid prn severe pain, Disp: 45 tablet, Rfl: 0   Oxymetazoline HCl (NASAL SPRAY NA), Place into the nose., Disp: , Rfl:    pantoprazole (PROTONIX) 40 MG tablet, Take 1 tablet (40 mg total) by mouth daily. (Patient taking differently: Take 40 mg by mouth 2 (two) times daily.), Disp: 180 tablet, Rfl: 1   potassium chloride SA (KLOR-CON M20) 20 MEQ tablet, TAKE 1 TABLET BY MOUTH EVERY DAY, Disp: 90 tablet, Rfl: 0   Probiotic Product (ALIGN PO), Take by mouth daily. (Patient not taking: Reported on 03/09/2023), Disp: , Rfl:    rosuvastatin (CRESTOR) 20 MG tablet, Take 20 mg by mouth., Disp: , Rfl:    torsemide (DEMADEX) 20 MG tablet, Take 1 tablet (20 mg total) by mouth 2 (two) times daily., Disp: 30 tablet, Rfl: 0   VENTOLIN HFA 108 (90 Base) MCG/ACT inhaler, TAKE 2 PUFFS BY MOUTH EVERY 6 HOURS AS NEEDED FOR WHEEZE OR SHORTNESS OF BREATH, Disp: 18 each, Rfl: 1   vitamin C (ASCORBIC ACID) 250 MG tablet, Take 250 mg by mouth daily., Disp: , Rfl:    VITAMIN D, CHOLECALCIFEROL, PO, Take 1 tablet by mouth daily. , Disp: , Rfl:    WIXELA INHUB 250-50 MCG/ACT AEPB, INHALE 1 PUFF INTO THE LUNGS IN THE MORNING AND AT BEDTIME., Disp: 60 each, Rfl: 1  EXAM:  VITALS per patient if applicable:     06/08/2023    3:01 PM 05/03/2023    4:39 PM 03/29/2023    1:55 PM  Vitals with BMI  Weight 278 lbs 3 oz     Systolic 134 112 191  Diastolic 84 70 78  Pulse 92 77 82   GENERAL: alert, oriented, appears well and in no acute distress  HEENT: atraumatic, conjunttiva clear, no obvious abnormalities on inspection of external nose and ears  NECK: normal movements of the head and neck  LUNGS: on inspection no signs of respiratory distress, breathing rate appears normal, no obvious gross SOB, gasping or wheezing  CV: no obvious cyanosis  MS: moves all visible extremities without noticeable abnormality  PSYCH/NEURO: pleasant and cooperative, no obvious depression or anxiety, speech and thought processing grossly intact  LABS: none today    Chemistry      Component Value Date/Time   NA 143 06/08/2023 1530   NA 139 07/23/2021 0000   K 4.3 06/08/2023 1530   CL 106 06/08/2023 1530   CO2 28 06/08/2023 1530   BUN 15 06/08/2023 1530   BUN 19 07/23/2021 0000   CREATININE 0.73 06/08/2023 1530   GLU 127 11/01/2020 0000      Component Value Date/Time   CALCIUM 8.9 06/08/2023 1530   ALKPHOS 49 12/15/2021 0343   AST 41 (H) 12/08/2022 1613   ALT 28 12/08/2022 1613   BILITOT 0.6 12/08/2022 1613      ASSESSMENT AND PLAN:  Discussed the following assessment and plan:  No diagnosis found.     I discussed the assessment and treatment plan with the patient. The patient was provided an opportunity to ask questions and all were answered. The patient agreed with the plan and demonstrated an understanding of the instructions.   F/u: ***  Signed:  Santiago Bumpers, MD           09/19/2023

## 2023-09-19 NOTE — Telephone Encounter (Addendum)
 Patient had a follow up appointment that was scheduled for this afternoon. Patient's wife Tim [name] called because patient's mobility has been decreasing so she feels that it would be too difficult for him to physically come into the office today. She is tearful over the phone. She feels like his mobility has been gradually worsening for the past 6 weeks. He is not able to stand for long periods of time. She also mentioned that patient has been depressed for 2-3 weeks. Denies suicidal ideation. She stated that patient has a fear of the possibility of going into a nursing home. He is worried about losing his cognitive abilities and worried about all his health needs being covered under his Medicare. Tim [name] asked about another option for patient to speak to the provider without needing to come to the office. She also mentioned that patient is supposed to have monitoring of his A1C. She was questioning if she can have home health services set up so that a nurse can come to patient's home to do his bloodwork.   I called the Clinic Access line and consulted with [place]. She stated patient's appointment today may be changed to virtual to address the depression concerns, but a follow up appt may need to be rescheduled for a later date. Today's appointment has been changed to virtual.  When I notified Tim [name] that the appointment was changed to virtual, she became upset and stated that patient and herself cannot navigate mychart to do the virtual appointment. She confirmed that patient is able to log in to [facility]. This RN provided instructions on how to prepare for the visit and I informed her that the provider would call at the scheduled appt time. She still does not feel comfortable doing the virtual appointment.    She is asking if there is anyway the provider can call patient through a telephone call rather than a virtual appt? Please advise.

## 2023-09-24 ENCOUNTER — Ambulatory Visit: Admitting: Family Medicine

## 2023-09-27 ENCOUNTER — Telehealth: Admitting: Family Medicine

## 2023-09-27 ENCOUNTER — Encounter: Payer: Self-pay | Admitting: Family Medicine

## 2023-09-27 VITALS — BP 114/68 | HR 77 | Temp 98.0°F

## 2023-09-27 DIAGNOSIS — G894 Chronic pain syndrome: Secondary | ICD-10-CM

## 2023-09-27 DIAGNOSIS — R5381 Other malaise: Secondary | ICD-10-CM

## 2023-09-27 DIAGNOSIS — R296 Repeated falls: Secondary | ICD-10-CM

## 2023-09-27 DIAGNOSIS — I5042 Chronic combined systolic (congestive) and diastolic (congestive) heart failure: Secondary | ICD-10-CM

## 2023-09-27 DIAGNOSIS — Z993 Dependence on wheelchair: Secondary | ICD-10-CM | POA: Diagnosis not present

## 2023-09-27 DIAGNOSIS — R531 Weakness: Secondary | ICD-10-CM | POA: Diagnosis not present

## 2023-09-27 DIAGNOSIS — E66812 Obesity, class 2: Secondary | ICD-10-CM

## 2023-09-27 MED ORDER — ALBUTEROL SULFATE HFA 108 (90 BASE) MCG/ACT IN AERS: 2.0000 | INHALATION_SPRAY | Freq: Four times a day (QID) | RESPIRATORY_TRACT | 0 refills | Status: DC | PRN

## 2023-09-27 MED ORDER — FERROUS GLUCONATE 324 (38 FE) MG PO TABS: 324.0000 mg | ORAL_TABLET | Freq: Two times a day (BID) | ORAL | 1 refills | Status: DC

## 2023-09-27 NOTE — Progress Notes (Signed)
 Virtual Visit via Video Note  I connected with Tim [name]  on 09/27/23 at  3:40 PM EDT by a video enabled telemedicine application and verified that I am speaking with the correct person using two identifiers.  Location patient: [place] Location provider:work or home office Persons participating in the virtual visit: patient, provider  I discussed the limitations and requested verbal permission for telemedicine visit. The patient expressed understanding and agreed to proceed.  HPI: 83 y/o male being seen today to discuss worsening debilitation.  Tim [name]'s generalized weakness and instability are progressing.  He has fallen a couple of times and needed first responders to help him up.  Fortunately, the only injury he sustained was a skin tear to the wrist.  He says this is healing up well. He has a very difficult time just transferring from the toilet to a chair. He rarely uses walker. He spends most of his time in a recliner. His immobility has progressed to the point where he cannot leave his house.  Recently he noted an increase in the swelling of both of his arms.  The chronic swelling in his legs worsened a little bit as well.  He started taking torsemide 20 mg twice a day.  Sounds like this was a couple weeks ago and he has done several days on and off of torsemide since that time.  He started to get some muscle spasms in his hands so he tried to combat this with drinking Gatorade light.  He does note that the swelling in his arms has gone down and that the swelling in his legs is no worse than his baseline now. He does feel short of breath when he gets up and walks anywhere.  He has no chest pain.  He monitors his oxygen fairly regularly and notes that at rest it is consistently above 95%.  Sometimes when he gets up and ambulates it drops to 85.  However, he says his oxygen sometimes stays perfectly normal when he gets up and walks. He does sometimes feel presyncopal. He noted 1 night in the last couple  of weeks in which she felt very cold for several hours.  This has not recurred. He does feel exasperated and depressed at times due to his declining medical status.  On 1 occasion he was crying and could not control his emotions for several hours.  On a good note he says that his regular use of his oxycodone has helped alleviate his pain very well. Chronic pain in back and knees, NSAIDs no help, oxycodone prn allows for improved function and quality of life. PMP AWARE reviewed today: most recent rx for oxycodone20mg   was filled 09/06/23, # 45,, rx by me. No red flags.   ROS: See pertinent positives and negatives per HPI.  Past Medical History:  Diagnosis Date   AICD (automatic cardioverter/defibrillator) present    Balanitis    +severe phimosis+ buried penis->circ not possible but dorsal slit done 10/2019   BPH (benign prostatic hypertrophy)    with urinary retention.  Renal u/s 12/04/19 NORMAL KIDNEYS, NORMAL BLADDER, NO HYDRONEPHROSIS   CAD (coronary artery disease)    Nonobstructive by 12/2012 cath; then 03/2016 he required BMS to RCA ([facility]).  In-stent restenosis on cath 11/01/16, baloon angioplasty successful.   CATARACT, HX OF    Chronic combined systolic and diastolic CHF (congestive heart failure) (HCC) 05/2016   Ischemic CM.  04/2018 EF 40-45%, grd I DD.  07/2021 EF 55-60%, AV well seated   Chronic pain syndrome  Lumbar DDD; chronic neuropathic pain (DM); has spinal stimulator and sees pain mgmt MD   Complicated UTI (urinary tract infection) 09/2019   Phimoses, acute urinary retention, entoerococcus UTI, enterococc bacteremia.  F/u blood clx's neg x 5d.     COPD    Debilitated patient    Diabetes mellitus type 2 with complications (HCC)    HbA1c jumped from 5.7% to 6.1% 03/2015---started metformin at that time.  DM 2 dx by fasting gluc criteria 2018.  Has chronic neuropath pain   Enterococcal bacteremia 09/2019   Phimoses, acute urinary retention, entoerococcus UTI, enterococc  bacteremia.  F/u blood clx's neg x 5d.     Essential hypertension, benign    Fatty liver 2007   2007 u/s showed fatty liver with hepatosplenomegaly.  2019 repeat u/s->fatty liver but no cirrhosis or hepatosplenomegaly.   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 Daiva Eves)   Iron deficiency anemia    09/2022.  Hemoccults x 3 neg Mar 2024.  Improving with oral iron. GI following, no endoscopies   Lumbosacral neuritis    Lumbosacral spondylosis    Lumbar spinal stenosis with neurogenic claudication--contributes to his chronic pain syndrome   Morbid obesity (HCC)    Normal memory function 08/2014   Neuropsychological testing ([facility]): no cognitive impairment or sign of neurodegenerative disorder.  Likely has adjustment d/o with mixed anxiety/depressed features and may benefit from low dose SNRI.     Normocytic anemia 03/2016   Mild-pt needs ferritin and vit B12 level checked (as of 03/22/16). Hb stable 09/2019.  Hemoccult x 3 NEG 03/2022.  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 chronic?) Off anticoag for a while due to falls.  Then apixaban started 12/2014.   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 Larna Daughters): neuropathic pain->diff to treat, failed nucynta, failed spinal stimul trial, oxycontin hs + tramadol  + gabap as of 12/2017 f/u Dr. Wynn Banker.   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   Sick sinus syndrome (HCC)    PPM placed   Thrombocytopenia (  HCC) 2018   HSM on 2007 abd u/s---suspect some mild splenic sequestration chronically.   Unspecified glaucoma(365.9)    Unspecified hereditary and idiopathic peripheral neuropathy approx age 85   bilat LE's, ? left arm, too.  Feet became progressively numb + left foot pain intermittently.  Pt may be trying a spinal stimulator (as of 05/2015)   Vaccine counseling 11/16/2021   VENOUS INSUFFICIENCY    Being followed by Dr. Lajoyce Corners as of 10/2016 for two R LL venous stasis ulcers/skin tears.  Healed as of 10/30/16 f/u with Dr. Lajoyce Corners.   Venous stasis dermatitis 12/27/2021   VENTRAL HERNIA     Past Surgical History:  Procedure Laterality Date   AMPUTATION Left 04/11/2013   Procedure: AMPUTATION DIGIT Left 3rd toe;  Surgeon: Nadara Mustard, MD;  Location: [facility] OR;  Service: Orthopedics;  Laterality: Left;  Left 3rd toe amputation at [facility]   AMPUTATION Left 08/29/2019   Procedure: LEFT TRANSMETATARSAL AMPUTATION;  Surgeon: Nadara Mustard, MD;  Location: [name] OR;  Service: Orthopedics;  Laterality: Left;   BIOPSY  05/04/2020   Procedure: BIOPSY;  Surgeon: Lynann Bologna, MD;  Location: [facility]  ENDOSCOPY;  Service: Endoscopy;;   CARDIAC CATHETERIZATION  1997; 03/10/16   1997 Non-obstructive disease.  03/2016 BMS to RCA, with 25% pDiag dz, o/w normal cors per cath 03/07/16.  Cath 11/01/16: in stent restenosis, successful baloon angioplasty. 08/2020 branch vessel dz, cont medical therapy rec'd.   CARDIAC CATHETERIZATION  12/24/2012   mild < 20% LCx, prox 30% RCA; LVEF 55-65% , moderate pulmonary HTN, moderate AS   CARDIAC DEFIBRILLATOR PLACEMENT  11/07/2017   [facility] MRI Quad CRT defibrillator   CARDIOVASCULAR STRESS TEST  05/11/16 ([facility])   2017 Myocardial perfusion imaging:  No ischemia; scar in apex, global hypokinesis, EF 36%.  06/15/20->mod primarily fixed inferolat wall defect mildly worse with stress c/w infarct/scar with mild peri-infarct ischemia, normal EF-->for cath per cards.   Carotid dopplers  03/09/2016   [facility]: no hemodynamically significant stenosis on either side.   CHOLECYSTECTOMY     COLONOSCOPY [name] 10/30/2012   Procedure: COLONOSCOPY;  Surgeon: Iva Boop, MD;  Location: [facility] ENDOSCOPY;  Service: Endoscopy;  Laterality: [name];   CORONARY ANGIOPLASTY WITH STENT PLACEMENT  03/2016; 04/2017   2017-[facility]: BMS to RCA-pt was placed on Brilinta.  04/2017: DES to RCA.   DORSAL SLIT [name] 10/29/2019   for severe phimosis. Procedure: DORSAL SLIT;  Surgeon: Sebastian Ache, MD;  Location: [facility] ORS;  Service: Urology;  Laterality: [name];  45 MINS   ESOPHAGEAL DILATION  05/04/2020   Procedure: ESOPHAGEAL DILATION;  Surgeon: Lynann Bologna, MD;  Location: [facility] ENDOSCOPY;  Service: Endoscopy;;   ESOPHAGOGASTRODUODENOSCOPY (EGD) WITH PROPOFOL [name] 05/04/2020   Procedure: ESOPHAGOGASTRODUODENOSCOPY (EGD) WITH PROPOFOL;  Surgeon: Lynann Bologna, MD;  Location[facility] ENDOSCOPY;  Service: Endoscopy;  Laterality: [name];   EYE SURGERY Bilateral cataract   HEMORRHOID SURGERY     INTRAOCULAR LENS INSERTION Bilateral    KNEE SURGERY Right    LEFT AND RIGHT HEART CATHETERIZATION WITH CORONARY ANGIOGRAM [name]  12/24/2012   Procedure: LEFT AND RIGHT HEART CATHETERIZATION WITH CORONARY ANGIOGRAM;  Surgeon: Peter M Swaziland, MD;  Location: [facility] CATH LAB;  Service: Cardiovascular;  Laterality: [name];   LEG SURGERY Bilateral    lenghtening    PACEMAKER PLACEMENT  04/13/2016   2nd deg HB after TAVR, pt had DC MDT PPM placed.   SHOULDER ARTHROSCOPY  08/30/2011   Procedure: ARTHROSCOPY SHOULDER;  Surgeon: Nadara Mustard, MD;  Location: [facility]  OR;  Service: Orthopedics;  Laterality: Right;  Right Shoulder Arthroscopy, Debridement, and Decompression   SPINAL CORD STIMULATOR INSERTION [name] 09/10/2015   Procedure: LUMBAR SPINAL CORD STIMULATOR INSERTION;  Surgeon: Odette Fraction, MD;  Location: [facility] NEURO ORS;  Service: Neurosurgery;  Laterality: [name];   TEE WITHOUT CARDIOVERSION [name] 09/16/2019   Procedure: TRANSESOPHAGEAL ECHOCARDIOGRAM (TEE);  Surgeon: Chilton Si, MD;  Location: [facility] ENDOSCOPY;  Service: Cardiovascular;  Laterality: [name];   TEE WITHOUT CARDIOVERSION [name] 12/02/2019   +vegetation on AVR and pacer lead in RV.  EF 55-60%, normal wall motion.  Valves function normal.  Procedure: TRANSESOPHAGEAL ECHOCARDIOGRAM (TEE);  Surgeon: Sande Rives, MD;  Location: [facility] ENDOSCOPY;  Service: Cardiovascular;  Laterality: [name];   TOE AMPUTATION Left    due to osteomyelitis.  R big toe surg due to osteoarth   TONSILLECTOMY     traeculectomy Left    eye   TRANSCATHETER AORTIC VALVE REPLACEMENT, TRANSFEMORAL  04/11/2016   TRANSESOPHAGEAL ECHOCARDIOGRAM  03/09/2016; 09/2019   [facility]: EF 55-60%, PFO seen with bi-directional shunting, no thrombus in appendage.  09/2019 ->no valvular vegetations. Small patent foramen ovale with predominantly left to right shunting across the interatrial septum.   TRANSTHORACIC ECHOCARDIOGRAM  01/2015; 01/2016; 05/18/16; 09/18/16, 05/2017, 08/2017   01/2015 No signif change in aortic stenosis (moderate).  01/2016 Severe LVH w/small LV cavity, EF 60-65%, grade I diast dysfxn.  05/2016 (s/p TAVR): EF  50-55%, grd I DD, biopros AV good.  08/2016--EF 50-55%, LV septal motion c/w conduction abnl, grd I DD,mild MS,bioprosth aortic valve well seated, w/trace AR. 05/2017 TTE EF 35%. 08/2017-EF 35%, mod diff hypokin LV, grd I DD, biopros AV good.    TRANSTHORACIC ECHOCARDIOGRAM  04/2018; 09/2019   04/2018: EF 40-45%, mod diffuse LV hypokin, grd I DD, bioprosth AV well seated, no AS or AR. 09/2019 EF 60-65%, grd I DD, valves fine, including bioprosth AV. 04/29/20 (tech diff) EF 55-60%, grd I DD, vegetation on MV.  07/2021 EF 55-60% (s/p upgrade to BiV PPM), AV well seated.   VITRECTOMY     Social History   Socioeconomic History   Marital status: Married    Spouse name: Not on file   Number of children: 0   Years of education: 20   Highest education level: Master's degree (e.g., MA, MS, MEng, MEd, MSW, MBA)  Occupational History   Occupation: Public relations account executive    Comment: retired  Tobacco Use   Smoking status: Never    Passive exposure: Never   Smokeless tobacco: Never  Vaping Use   Vaping status: Never Used  Substance and Sexual Activity   Alcohol use: No    Alcohol/week: 0.0 standard drinks of alcohol   Drug use: No    Types: Oxycodone   Sexual activity: Not Currently  Other Topics Concern   Not on file  Social History Narrative   HSG, [name] - BS, [name] - MS-engineering, 2 years on PhD - [employer] [place]. Married - '65 - 71yrs/divorced; '76- 3 yrs/divorced; '92 . No children. 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    Lives with wife as of 2020. ACP/Living Will - Yes CPR; long-term Mechanical ventilation as long as he was able to cognate; ok for long term artificial nutrition. Precondition being able to cognate and not to have too much pain.    Social Drivers of Corporate investment banker Strain: Low Risk  (09/23/2023)   Overall Financial Resource Strain (CARDIA)    Difficulty of Paying Living Expenses: Not very hard  Food Insecurity: No Food Insecurity (09/23/2023)   Hunger  Vital Sign    Worried About Running Out of Food in the Last Year: Never true    Ran Out of Food in the Last Year: Never true  Transportation Needs: Unmet Transportation Needs (09/23/2023)   PRAPARE - Transportation    Lack of Transportation (Medical): Yes    Lack of Transportation (Non-Medical): Yes  Physical Activity: Unknown (09/23/2023)   Exercise Vital Sign    Days of Exercise per Week: 0 days    Minutes of Exercise per Session: Not on file  Stress: Stress Concern Present (09/23/2023)   [employer] of Occupational Health - Occupational Stress Questionnaire    Feeling of Stress : Very much  Social Connections: Socially Isolated (09/23/2023)   Social Connection and Isolation Panel [NHANES]    Frequency of Communication with Friends and Family: Once a week    Frequency of Social Gatherings with Friends and Family: Never    Attends Religious Services: Never    Database administrator or Organizations: No    Attends Engineer, structural: Not on file    Marital Status: Married     Current Outpatient Medications:    albuterol (VENTOLIN HFA) 108 (90 Base) MCG/ACT inhaler, Inhale 2 puffs into the lungs every 6 (six) hours as needed for wheezing or shortness of breath., Disp: 8 g, Rfl: 0   allopurinol (ZYLOPRIM) 300 MG tablet, TAKE 1 TABLET (300 MG TOTAL) BY MOUTH DAILY. WITH FOOD, Disp: 90 tablet, Rfl: 0   amoxicillin (AMOXIL) 500 MG capsule, Take 2 capsules (1,000 mg total) by mouth 2 (two) times daily., Disp: 120 capsule, Rfl: 11   apixaban (ELIQUIS) 5 MG TABS tablet, Take 5 mg by mouth 2 (two) times daily., Disp: , Rfl:    ASPIRIN LOW DOSE 81 MG EC tablet, Take 81 mg by mouth daily., Disp: , Rfl:    B Complex-C (B-COMPLEX WITH VITAMIN C) tablet, Take 1 tablet by mouth daily. Take 1 tablet daily, 250mg  (Patient not taking: Reported on 03/29/2023), Disp: , Rfl:    ezetimibe (ZETIA) 10 MG tablet, Take 1 tablet (10 mg total) by mouth daily., Disp: 30 tablet, Rfl: 1   ferrous  gluconate (FERGON) 324 MG tablet, Take 1 tablet (324 mg total) by mouth 2 (two) times daily., Disp: 90 tablet, Rfl: 1   finasteride (PROSCAR) 5 MG tablet, TAKE 1 TABLET (5 MG TOTAL) BY MOUTH DAILY., Disp: 90 tablet, Rfl: 0   gabapentin (NEURONTIN) 800 MG tablet, TAKE 1 TABLET BY MOUTH 4 TIMES DAILY., Disp: 360 tablet, Rfl: 1   ipratropium (ATROVENT) 0.03 % nasal spray, 2 SPRAY EACH NOSTRIL EVERY 12 HOURS FOR RUNNY NOSE, Disp: 30 mL, Rfl: 2   Loratadine (CLARITIN PO), Take 1 tablet by mouth in the morning and at bedtime., Disp: , Rfl:    mometasone-formoterol (DULERA) 100-5 MCG/ACT AERO, Inhale 2 puffs into the lungs as needed for wheezing or shortness of breath. (Patient not taking: Reported on 03/09/2023), Disp: , Rfl:    Multiple Vitamin (MULTIVITAMIN ADULT PO), Take 1 tablet by mouth daily., Disp: , Rfl:    naloxone (NARCAN) nasal spray 4 mg/0.1 mL, One spray in nostril as needed for opoid overdose.  May repeat dose in 5 min if needed., Disp: 2 each, Rfl: 1   niacin (NIASPAN) 1000 MG CR tablet, TAKE 1 TABLET BY MOUTH TWICE A DAY, Disp: 180 tablet, Rfl: 1   nitroGLYCERIN (NITROSTAT) 0.4 MG SL tablet, Place 0.4 mg under  the tongue every 5 (five) minutes as needed for chest pain. (Patient not taking: Reported on 03/09/2023), Disp: , Rfl:    Oxycodone HCl 20 MG TABS, 1 tab po bid prn severe pain, Disp: 45 tablet, Rfl: 0   Oxymetazoline HCl (NASAL SPRAY NA), Place into the nose., Disp: , Rfl:    pantoprazole (PROTONIX) 40 MG tablet, Take 1 tablet (40 mg total) by mouth daily. (Patient taking differently: Take 40 mg by mouth 2 (two) times daily.), Disp: 180 tablet, Rfl: 1   potassium chloride SA (KLOR-CON M20) 20 MEQ tablet, TAKE 1 TABLET BY MOUTH EVERY DAY, Disp: 90 tablet, Rfl: 0   Probiotic Product (ALIGN PO), Take by mouth daily. (Patient not taking: Reported on 03/09/2023), Disp: , Rfl:    rosuvastatin (CRESTOR) 20 MG tablet, Take 20 mg by mouth., Disp: , Rfl:    torsemide (DEMADEX) 20 MG tablet, Take 1  tablet (20 mg total) by mouth 2 (two) times daily., Disp: 30 tablet, Rfl: 0   vitamin C (ASCORBIC ACID) 250 MG tablet, Take 250 mg by mouth daily., Disp: , Rfl:    VITAMIN D, CHOLECALCIFEROL, PO, Take 1 tablet by mouth daily. , Disp: , Rfl:    WIXELA INHUB 250-50 MCG/ACT AEPB, INHALE 1 PUFF INTO THE LUNGS IN THE MORNING AND AT BEDTIME., Disp: 60 each, Rfl: 1  EXAM:  VITALS per patient if applicable:     09/27/2023    6:17 PM 06/08/2023    3:01 PM 05/03/2023    4:39 PM  Vitals with BMI  Weight  278 lbs 3 oz   Systolic 114 134 960  Diastolic 68 84 70  Pulse 77 92 77   GENERAL: alert, oriented, appears well and in no acute distress  HEENT: atraumatic, conjunttiva clear, no obvious abnormalities on inspection of external nose and ears  NECK: normal movements of the head and neck  LUNGS: on inspection no signs of respiratory distress, breathing rate appears normal, no obvious gross SOB, gasping or wheezing  CV: no obvious cyanosis  MS: moves all visible extremities without noticeable abnormality  PSYCH/NEURO: pleasant and cooperative, no obvious depression or anxiety, speech and thought processing grossly intact  LABS: none today    Chemistry      Component Value Date/Time   NA 143 06/08/2023 1530   NA 139 07/23/2021 0000   K 4.3 06/08/2023 1530   CL 106 06/08/2023 1530   CO2 28 06/08/2023 1530   BUN 15 06/08/2023 1530   BUN 19 07/23/2021 0000   CREATININE 0.73 06/08/2023 1530   GLU 127 11/01/2020 0000      Component Value Date/Time   CALCIUM 8.9 06/08/2023 1530   ALKPHOS 49 12/15/2021 0343   AST 41 (H) 12/08/2022 1613   ALT 28 12/08/2022 1613   BILITOT 0.6 12/08/2022 1613     Lab Results  Component Value Date   WBC 5.4 06/08/2023   HGB 13.4 06/08/2023   HCT 39.7 06/08/2023   MCV 94.3 06/08/2023   PLT 104 (L) 06/08/2023   Lab Results  Component Value Date   IRON 62 06/08/2023   TIBC 265 06/08/2023   FERRITIN 22 (L) 06/08/2023   Lab Results  Component  Value Date   HGBA1C 5.5 06/08/2023   HGBA1C 5.5 06/08/2023   HGBA1C 5.5 (A) 06/08/2023   HGBA1C 5.5 06/08/2023   Lab Results  Component Value Date   TESTOSTERONE 108 (L) 02/22/2016   ASSESSMENT AND PLAN:  Discussed the following assessment and plan:  #  1 chronic debilitation, progressing. His wife takes excellent care of him but his mobility problem is very difficult to overcome. He needs home health nursing and home health PT. Needs lift chair.  2.  Chronic combined systolic and diastolic heart failure (ischemic CM). He is unable to weigh himself. Symptomatically he has chronic DOE and is hard to judge from this symptom alone whether or not he is volume overloaded. He has been recently taking his torsemide in response to increased swelling of both arms.  The swelling is gone down.  I advised him to stop taking torsemide at this time but he may take it in the future as needed for significant increase in swelling.  We will need to start monitoring his electrolytes and creatinine more regularly and hopefully can do this via home health nursing since he is unable to leave the house. He is followed by [name] cardiology. Most recent follow-up with them was January 03, 2023. March 2024 EF 50 to 55%.  3.  Chronic pain syndrome. Responding well to oxycodone 20 mg taken at least once a day and sometimes twice a day regularly.   I discussed the assessment and treatment plan with the patient. The patient was provided an opportunity to ask questions and all were answered. The patient agreed with the plan and demonstrated an understanding of the instructions.   F/u: 1 mo  Signed:  Santiago Bumpers, MD           09/27/2023

## 2023-10-03 ENCOUNTER — Ambulatory Visit: Admitting: Infectious Disease

## 2023-10-06 ENCOUNTER — Other Ambulatory Visit: Payer: Self-pay | Admitting: Family Medicine

## 2023-10-12 ENCOUNTER — Other Ambulatory Visit: Payer: Self-pay | Admitting: Family Medicine

## 2023-10-17 ENCOUNTER — Other Ambulatory Visit: Payer: Self-pay | Admitting: Family Medicine

## 2023-10-18 ENCOUNTER — Other Ambulatory Visit: Payer: Self-pay | Admitting: Family Medicine

## 2023-10-18 NOTE — Telephone Encounter (Signed)
 Copied from CRM [id number]. Topic: Clinical - Medication Refill >> Oct 18, 2023 11:24 AM Adonis Hoot wrote: Most Recent Primary Care Visit:  Provider: Shelvia Dick  Department: [facility]  Visit Type: MYCHART VIDEO VISIT  Date: 09/27/2023  Medication: apixaban (ELIQUIS) 5 MG TABS tablet  Has the patient contacted their pharmacy? Yes (Agent: If no, request that the patient contact the pharmacy for the refill. If patient does not wish to contact the pharmacy document the reason why and proceed with request.) (Agent: If yes, when and what did the pharmacy advise?)  Is this the correct pharmacy for this prescription? Yes If no, delete pharmacy and type the correct one.  This is the patient's preferred pharmacy:  [phone] - [place], [place] - [address] [place] [address] Phone: [phone] Fax: [phone]    Has the prescription been filled recently? No  Is the patient out of the medication? Yes  Has the patient been seen for an appointment in the last year OR does the patient have an upcoming appointment? Yes  Can we respond through MyChart? Yes  Agent: Please be advised that Rx refills may take up to 3 business days. We ask that you follow-up with your pharmacy.

## 2023-10-25 ENCOUNTER — Other Ambulatory Visit: Payer: Self-pay | Admitting: Family Medicine

## 2023-10-25 NOTE — Telephone Encounter (Signed)
 Copied from CRM [id number]. Topic: Clinical - Medication Refill >> Oct 25, 2023  3:35 PM Freya Jesus wrote: Most Recent Primary Care Visit:  Provider: Shelvia Dick  Department: [facility]  Visit Type: [facility] VIDEO VISIT  Date: 09/27/2023  Medication: Oxycodone  HCl 20 MG TABS [[id number]]  Has the patient contacted their pharmacy? No (Agent: If no, request that the patient contact the pharmacy for the refill. If patient does not wish to contact the pharmacy document the reason why and proceed with request.) (Agent: If yes, when and what did the pharmacy advise?)  Is this the correct pharmacy for this prescription? Yes If no, delete pharmacy and type the correct one.  This is the patient's preferred pharmacy:  [phone] [place], [place] - [address] [place] [address] Phone: [phone] Fax: [phone]    Has the prescription been filled recently? Yes  Is the patient out of the medication? Yes  Has the patient been seen for an appointment in the last year OR does the patient have an upcoming appointment? Yes  Can we respond through MyChart? No  Agent: Please be advised that Rx refills may take up to 3 business days. We ask that you follow-up with your pharmacy.

## 2023-10-26 ENCOUNTER — Other Ambulatory Visit: Payer: Self-pay | Admitting: Family Medicine

## 2023-10-27 MED ORDER — OXYCODONE HCL 20 MG PO TABS
ORAL_TABLET | ORAL | 0 refills | Status: DC
Start: 2023-10-27 — End: 2023-12-12

## 2023-10-29 ENCOUNTER — Other Ambulatory Visit: Payer: Self-pay | Admitting: Family Medicine

## 2023-11-07 ENCOUNTER — Other Ambulatory Visit: Payer: Self-pay | Admitting: Family Medicine

## 2023-11-13 ENCOUNTER — Other Ambulatory Visit: Payer: Self-pay | Admitting: Family Medicine

## 2023-11-14 ENCOUNTER — Ambulatory Visit: Admitting: Infectious Disease

## 2023-11-21 NOTE — Addendum Note (Signed)
 Addended by: Terris Fickle D on: 11/21/2023 09:47 AM   Modules accepted: Orders

## 2023-11-25 ENCOUNTER — Other Ambulatory Visit: Payer: Self-pay | Admitting: Family Medicine

## 2023-11-28 ENCOUNTER — Telehealth: Payer: Self-pay

## 2023-11-28 NOTE — Telephone Encounter (Signed)
 noted

## 2023-11-28 NOTE — Telephone Encounter (Signed)
 Copied from CRM [id number]. Topic: General - Other >> Nov 28, 2023  4:39 PM Luane Rumps D wrote: Reason for CRM: Bethany with [facility] called to advise that they received the referral for home health for Tim [name], and when they called to set it up the patient requested to delay services til 6/03. Per patient request they will start home health services next week.  Sent as FYI

## 2023-12-07 ENCOUNTER — Telehealth: Payer: Self-pay

## 2023-12-07 NOTE — Telephone Encounter (Signed)
 Approved VO given to [place].

## 2023-12-07 NOTE — Telephone Encounter (Signed)
Okay great!

## 2023-12-07 NOTE — Telephone Encounter (Signed)
 Copied from CRM [id number]. Topic: Clinical - Home Health Verbal Orders >> Dec 07, 2023  8:23 AM Alyse July wrote: Caller/Agency: [facility] Callback Number: [phone] VM can be left  Service Requested: Physical Therapy(Continue) Frequency: once a week and twice a week 3 1 week for 5 weeks Any new concerns about the patient? No

## 2023-12-10 ENCOUNTER — Telehealth: Payer: Self-pay

## 2023-12-10 NOTE — Telephone Encounter (Signed)
 Yes okay

## 2023-12-10 NOTE — Telephone Encounter (Signed)
 Communication  Caller/Agency: [facility]    Callback Number: [phone] secure line    Service Requested: Skilled Nursing    Frequency: 1 week 1, and then one every other week    Any new concerns about the patient? No   Okay for orders?

## 2023-12-10 NOTE — Telephone Encounter (Signed)
 Spoke with Samantha about approved orders

## 2023-12-11 ENCOUNTER — Telehealth: Payer: Self-pay

## 2023-12-11 NOTE — Telephone Encounter (Signed)
 Copied from CRM [id number]. Topic: Clinical - Lab/Test Results >> Dec 11, 2023 12:48 PM [name] wrote: Reason for CRM: Patient is wanting a full blood work panel to be done since it's been a while since he last had blood drawn. If this order is possible, he would like if his nurse through [facility] could complete that for him and come to his house for the blood work to be completed.  Tim [name], Tim Fonder "[name]" (Patient)   Subject  Tim [name], Tim Fonder "[name]" (Patient)   Topic  Clinical - Home Health Verbal Orders    Communication  Caller/Agency: [facility]    Callback Number: [phone] confidential voicemail    Service Requested: Skilled Nursing    Frequency: every other week for 8 weeks    Any new concerns about the patient? No if labs need to be drawn please leave that in her message and she will take care of it

## 2023-12-11 NOTE — Telephone Encounter (Signed)
 Please specify what labs pt will need and I can notify home health nurse

## 2023-12-11 NOTE — Telephone Encounter (Signed)
Yes, okay.

## 2023-12-12 ENCOUNTER — Other Ambulatory Visit: Payer: Self-pay

## 2023-12-12 ENCOUNTER — Other Ambulatory Visit: Payer: Self-pay | Admitting: Family Medicine

## 2023-12-12 MED ORDER — TORSEMIDE 20 MG PO TABS
20.0000 mg | ORAL_TABLET | Freq: Two times a day (BID) | ORAL | 1 refills | Status: AC
Start: 2023-12-12 — End: ?

## 2023-12-12 MED ORDER — OXYCODONE HCL 20 MG PO TABS: ORAL_TABLET | ORAL | 0 refills | Status: DC

## 2023-12-12 MED ORDER — POTASSIUM CHLORIDE CRYS ER 20 MEQ PO TBCR: 20.0000 meq | EXTENDED_RELEASE_TABLET | Freq: Every day | ORAL | 1 refills | Status: DC

## 2023-12-12 MED ORDER — ALLOPURINOL 300 MG PO TABS: 300.0000 mg | ORAL_TABLET | Freq: Every day | ORAL | 1 refills | Status: DC

## 2023-12-12 MED ORDER — FLUTICASONE-SALMETEROL 250-50 MCG/ACT IN AEPB: 1.0000 | INHALATION_SPRAY | Freq: Two times a day (BID) | RESPIRATORY_TRACT | 5 refills | Status: DC

## 2023-12-12 NOTE — Telephone Encounter (Signed)
 Left detailed message with approved verbal orders along with requested labs on secure voicemail. Office and fax number provided as well.

## 2023-12-12 NOTE — Telephone Encounter (Signed)
 Please order c-Met, CBC with differential, iron panel, and lipid panel.  Diagnosis chronic congestive heart failure and iron deficiency anemia.

## 2023-12-12 NOTE — Telephone Encounter (Signed)
 Copied from CRM [id number]. Topic: Clinical - Medication Refill >> Dec 12, 2023  2:20 PM Deaijah H wrote: Medication: Oxycodone  HCl 20 MG TABS  Has the patient contacted their pharmacy? Yes (Agent: If no, request that the patient contact the pharmacy for the refill. If patient does not wish to contact the pharmacy document the reason why and proceed with request.) (Agent: If yes, when and what did the pharmacy advise?) To speak with provider  This is the patient's preferred pharmacy:  [phone] [place], [place] - [address] [place] [address] Phone: [phone] Fax: [phone]   Is this the correct pharmacy for this prescription? Yes If no, delete pharmacy and type the correct one.   Has the prescription been filled recently? Yes  Is the patient out of the medication? No, out in 2 days  Has the patient been seen for an appointment in the last year OR does the patient have an upcoming appointment? Yes  Can we respond through MyChart? Yes  Agent: Please be advised that Rx refills may take up to 3 business days. We ask that you follow-up with your pharmacy.

## 2023-12-14 ENCOUNTER — Telehealth: Payer: Self-pay

## 2023-12-14 NOTE — Telephone Encounter (Signed)
 Copied from CRM [id number]. Topic: General - Other >> Dec 14, 2023  2:04 PM Annelle Kiel wrote: Reason for CRM: patient wife is calling in wanting to know if dr Johnette Naegeli has ordered the patient some labs and also wants to know if the order can be sent over to home health care [facility]  [phone]    she would like a call back when this has been done  Tried calling patient, unable to LVM.     12/12/23 11:17 AM BDS Note: Left detailed message with approved verbal orders along with requested labs on secure voicemail. Office and fax number provided as well.

## 2023-12-27 ENCOUNTER — Other Ambulatory Visit: Payer: Self-pay | Admitting: Family Medicine

## 2024-01-02 ENCOUNTER — Telehealth: Payer: Self-pay

## 2024-01-02 NOTE — Telephone Encounter (Signed)
 Yes okay

## 2024-01-02 NOTE — Telephone Encounter (Signed)
 Copied from CRM [id number]. Topic: Clinical - Home Health Verbal Orders >> Jan 01, 2024  2:50 PM Robinson DEL wrote: Caller/Agency: [facility] Callback Number: [phone] Secure line Service Requested: Lab orders that provider wanted home health to perform okay to leave order on voicemail Frequency: Leave the labs that provider wants done on voicemail Any new concerns about the patient? No

## 2024-01-02 NOTE — Telephone Encounter (Signed)
 Left secure VM

## 2024-01-03 LAB — BASIC METABOLIC PANEL WITH GFR
BUN: 24 — AB (ref 4–21)
CO2: 28 — AB (ref 13–22)
Chloride: 99 (ref 99–108)
Creatinine: 1.1 (ref 0.6–1.3)
Glucose: 130
Potassium: 4 meq/L (ref 3.5–5.1)
Sodium: 143 (ref 137–147)

## 2024-01-03 LAB — HEPATIC FUNCTION PANEL
ALT: 53 U/L — AB (ref 10–40)
AST: 122 — AB (ref 14–40)
Alkaline Phosphatase: 115 (ref 25–125)
Bilirubin, Total: 0.7

## 2024-01-03 LAB — CBC AND DIFFERENTIAL
HCT: 41 (ref 41–53)
Hemoglobin: 13.3 — AB (ref 13.5–17.5)
Neutrophils Absolute: 2.7
Platelets: 89 K/uL — AB (ref 150–400)
WBC: 5.4

## 2024-01-03 LAB — IRON,TIBC AND FERRITIN PANEL
%SAT: 29
Iron: 71
TIBC: 243
UIBC: 172

## 2024-01-03 LAB — COMPREHENSIVE METABOLIC PANEL WITH GFR
Albumin: 3.3 — AB (ref 3.5–5.0)
Calcium: 8.8 (ref 8.7–10.7)
Globulin: 2.2
eGFR: 65

## 2024-01-03 LAB — CBC: RBC: 4.13 (ref 3.87–5.11)

## 2024-01-03 LAB — LIPID PANEL: Cholesterol: 76 (ref 0–200)

## 2024-01-29 ENCOUNTER — Other Ambulatory Visit: Payer: Self-pay | Admitting: Family Medicine

## 2024-01-29 MED ORDER — OXYCODONE HCL 20 MG PO TABS: ORAL_TABLET | ORAL | 0 refills | Status: DC

## 2024-01-29 NOTE — Telephone Encounter (Signed)
 Oxycodone  rx sent. Needs o/v (virtual okay)

## 2024-01-29 NOTE — Telephone Encounter (Unsigned)
 Copied from CRM [id number]. Topic: Clinical - Medication Refill >> Jan 29, 2024  2:19 PM Aisha D wrote: Medication: Oxycodone  HCl 20 MG TABS  Has the patient contacted their pharmacy? Yes (Agent: If no, request that the patient contact the pharmacy for the refill. If patient does not wish to contact the pharmacy document the reason why and proceed with request.) (Agent: If yes, when and what did the pharmacy advise?)  This is the patient's preferred pharmacy:  [phone] [place] [place] - [address] [place] [address] Phone: [phone] Fax: [phone]  Is this the correct pharmacy for this prescription? Yes If no, delete pharmacy and type the correct one.   Has the prescription been filled recently? No  Is the patient out of the medication? No  Has the patient been seen for an appointment in the last year OR does the patient have an upcoming appointment? Yes  Can we respond through MyChart? Yes  Agent: Please be advised that Rx refills may take up to 3 business days. We ask that you follow-up with your pharmacy.

## 2024-02-04 ENCOUNTER — Other Ambulatory Visit: Payer: Self-pay | Admitting: Family Medicine

## 2024-02-12 ENCOUNTER — Encounter: Payer: Self-pay | Admitting: Family

## 2024-02-12 ENCOUNTER — Ambulatory Visit: Admitting: Family

## 2024-02-12 ENCOUNTER — Other Ambulatory Visit (INDEPENDENT_AMBULATORY_CARE_PROVIDER_SITE_OTHER): Payer: Self-pay

## 2024-02-12 DIAGNOSIS — M25571 Pain in right ankle and joints of right foot: Secondary | ICD-10-CM

## 2024-02-12 LAB — BASIC METABOLIC PANEL WITH GFR
BUN: 23 — AB (ref 4–21)
CO2: 28 — AB (ref 13–22)
Chloride: 99 (ref 99–108)
Creatinine: 1.1 (ref 0.6–1.3)
Glucose: 127
Potassium: 3.6 meq/L (ref 3.5–5.1)
Sodium: 141 (ref 137–147)

## 2024-02-12 LAB — HEPATIC FUNCTION PANEL
ALT: 40 U/L (ref 10–40)
AST: 99 — AB (ref 14–40)
Alkaline Phosphatase: 186 — AB (ref 25–125)
Bilirubin, Total: 0.9

## 2024-02-12 LAB — COMPREHENSIVE METABOLIC PANEL WITH GFR
Albumin: 3.3 — AB (ref 3.5–5.0)
Calcium: 9.1 (ref 8.7–10.7)
Globulin: 2.8

## 2024-02-12 LAB — LIPID PANEL
Cholesterol: 87 (ref 0–200)
HDL: 49 (ref 35–70)
LDL Cholesterol: 22
Triglycerides: 77 (ref 40–160)

## 2024-02-12 MED ORDER — DICLOFENAC SODIUM 75 MG PO TBEC: 75.0000 mg | DELAYED_RELEASE_TABLET | Freq: Two times a day (BID) | ORAL | 0 refills | Status: DC

## 2024-02-12 NOTE — Progress Notes (Signed)
 Office Visit Note   Patient: Tim [name]           Date of Birth: [birth year]           MRN: [id number] Visit Date: 02/12/2024              Requested by: [name], MD [address] [place],  [place] [address] PCP: [name], MD  No chief complaint on file.     HPI: The patient is an 83 year old gentleman who is well-known to our office who presents today for evaluation of the right foot and ankle after a mechanical fall several weeks ago, he thinks about 4 weeks ago. Unsure of mechanism of injury, may have twisted foot or ankle  Having lateral and plantar foot pain, not improving. Initially had bruising beneath the forefoot. Denies swelling. Pain with weight bearing over lateral foot. 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  History of diabetes.  On chronic antimicrobial therapy with amoxicillin  twice daily.  Assessment & Plan: Visit Diagnoses: No diagnosis found.  Plan: ankle sprain. Discussed conservative measures and ankle rom and strengthening. Will follow up as needed.  Follow-Up Instructions: No follow-ups on file.   Right Ankle Exam   Tenderness  The patient is experiencing tenderness in the ATF and CF. Swelling: mild  Range of Motion  The patient has normal right ankle ROM.  Tests  Anterior drawer: negative  Other  Erythema: absent Pulse: present   Comments:  No bony tenderness to foot. No ecchymosis       [name], oriented, no adenopathy, well-dressed, normal affect, normal respiratory effort.     Imaging: No results found. No images are attached to the encounter.  Labs: Lab Results  Component Value Date   HGBA1C 5.5 06/08/2023   HGBA1C 5.5 06/08/2023   HGBA1C 5.5 (A) 06/08/2023   HGBA1C 5.5 06/08/2023   ESRSEDRATE 14 03/29/2023    ESRSEDRATE 30 (H) 12/15/2021   ESRSEDRATE 34 (H) 11/24/2020   CRP <3.0 03/29/2023   CRP 1.3 11/24/2020   CRP 0.9 07/14/2020   LABURIC 3.5 (L) 09/10/2013   REPTSTATUS 12/15/2021 FINAL 12/14/2021   CULT  12/14/2021    NO GROWTH Performed at [facility], [address] [place], [place] [address]    [facility] ENTEROCOCCUS FAECALIS 09/11/2019     Lab Results  Component Value Date   ALBUMIN 3.3 (A) 01/03/2024   ALBUMIN 2.8 (L) 12/15/2021   ALBUMIN 3.2 (L) 12/14/2021    Lab Results  Component Value Date   MG 1.7 05/07/2020   MG 1.6 (L) 05/06/2020   MG 1.8 05/05/2020   No results found for: VD25OH  No results found for: PREALBUMIN    Latest Ref Rng & Units 01/03/2024   12:00 AM 06/08/2023    3:30 PM 03/29/2023    2:10 PM  CBC EXTENDED  WBC  5.4     5.4  5.8   RBC 3.87 - 5.11 4.13     4.21  4.16   Hemoglobin 13.5 - 17.5 13.3     13.4  12.8   HCT 41 - 53 41     39.7  39.5   Platelets 150 - 400 K/uL 89     104  131   NEUT#  2.70  3,225   Lymph# 850 - 3,900 cells/uL   1,833      This result is from an external source.     There is no height or weight on file to calculate BMI.  Orders:  No orders of the defined types were placed in this encounter.  No orders of the defined types were placed in this encounter.    Procedures: No procedures performed  Clinical Data: No additional findings.  ROS:  All other systems negative, except as noted in the HPI. Review of Systems  Objective: Vital Signs: There were no vitals taken for this visit.  Specialty Comments:  No specialty comments available.  PMFS History: Patient Active Problem List   Diagnosis Date Noted   Osteomyelitis of left foot (HCC) 05/03/2023   Venous stasis dermatitis 12/27/2021   Cellulitis 12/14/2021   Wound of right lower extremity 12/14/2021   Transient hypotension 12/14/2021   Heart failure with preserved ejection fraction (HCC) 12/14/2021   Prediabetes 12/14/2021    Thrombocytopenia (HCC) 12/14/2021   Obesity, Class III, BMI 40-49.9 (morbid obesity) 12/14/2021   BPH (benign prostatic hyperplasia) 12/14/2021   Vaccine counseling 11/16/2021   Weakness 02/23/2021   Physical deconditioning 02/23/2021   Rhinitis 07/14/2020   Mitral valve vegetation 05/28/2020   Non-pressure chronic ulcer of other part of left foot limited to breakdown of skin (HCC)    Esophageal dysphagia    Bacteremia    Endocarditis of mitral valve    Fever 04/28/2020   Cervical lymphadenopathy 03/15/2020   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   ICD (implantable cardioverter-defibrillator) infection (HCC) 12/22/2019   Gram-positive bacteremia 11/27/2019   FUO (fever of unknown origin) 11/26/2019   Low blood pressure reading 11/05/2019   Wound dehiscence 10/22/2019   PFO (patent foramen ovale) 10/06/2019   ICD (implantable cardioverter-defibrillator) battery depletion    Pressure injury of skin 09/13/2019   Enterococcal bacteremia history  09/13/2019   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   Urinary retention 09/13/2019   Phimosis 09/13/2019   SIRS (systemic inflammatory response syndrome) (HCC) 09/12/2019   Sepsis (HCC) 09/12/2019   Enterococcus UTI 09/12/2019   Cellulitis of foot    History of transmetatarsal amputation of left foot (HCC)    Subacute osteomyelitis, left ankle and foot (HCC)    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) 04/17/2018   Diabetic neuropathy, painful (HCC) 12/26/2016   Neuropathic pain of foot, left 06/20/2016   Insulin  resistance 07/22/2015   Status post amputation of left great toe (HCC) 06/08/2015   Spinal stenosis of lumbar region 02/16/2015   Hereditary and idiopathic peripheral neuropathy 01/12/2015   Oral thrush 12/28/2014   Orthostatic dizziness 05/24/2014   Paresthesias with subjective  weakness 01/30/2014   Bilateral thigh pain 01/30/2014   Abdominal wall pain in right upper quadrant 01/22/2014   Gross hematuria 09/25/2013   Intertrigo 09/25/2013   IBS (irritable bowel syndrome) 09/11/2013   Chronic pain syndrome 08/06/2013   CAD (coronary artery disease), native coronary artery 12/25/2012   Paroxysmal atrial fibrillation (HCC) 12/25/2012   Moderate aortic stenosis 12/25/2012   Aortic stenosis 12/24/2012   History of colonic polyps 10/30/2012   Diverticulosis of colon 10/30/2012   Internal hemorrhoids 10/30/2012   Change in bowel habits intermittent loose stools 10/30/2012   Routine health maintenance 06/09/2012   Hereditary peripheral neuropathy 10/10/2011   Long term current use of anticoagulant - apixiban 08/16/2010  Dyspnea on exertion 09/08/2009   HYPERCHOLESTEROLEMIA-PURE 01/13/2009   Essential hypertension, benign 01/13/2009   EDEMA 01/13/2009   GOUT 01/12/2009   GLAUCOMA 01/12/2009   Venous insufficiency (chronic) (peripheral) 01/12/2009   COPD (chronic obstructive pulmonary disease) (HCC) 01/12/2009   VENTRAL HERNIA 01/12/2009   OSTEOARTHRITIS 01/12/2009   ARTHRITIS 01/12/2009   DEGENERATIVE DISC DISEASE 01/12/2009   CATARACT, HX OF 01/12/2009   HEART MURMUR, HX OF 01/12/2009   BENIGN PROSTATIC HYPERTROPHY, HX OF 01/12/2009   Obesity 01/17/2007   OBSTRUCTIVE SLEEP APNEA 01/17/2007   ASTHMA 01/17/2007   GERD 01/17/2007   HALLUX VALGUS, ACQUIRED 01/17/2007   Pulmonary hypertension (HCC) 01/17/2007   Past Medical History:  Diagnosis Date   AICD (automatic cardioverter/defibrillator) present    Balanitis    +severe phimosis+ buried penis->circ not possible but dorsal slit done 10/2019   BPH (benign prostatic hypertrophy)    with urinary retention.  Renal u/s 12/04/19 NORMAL KIDNEYS, NORMAL BLADDER, NO HYDRONEPHROSIS   CAD (coronary artery disease)    Nonobstructive by 12/2012 cath; then 03/2016 he required BMS to RCA ([facility]).  In-stent restenosis  on cath 11/01/16, baloon angioplasty successful.   CATARACT, HX OF    Chronic combined systolic and diastolic CHF (congestive heart failure) (HCC) 05/2016   Ischemic CM.  04/2018 EF 40-45%, grd I DD.  07/2021 EF 55-60%, AV well seated   Chronic pain syndrome    Lumbar DDD; chronic neuropathic pain (DM); has spinal stimulator and sees pain mgmt MD   Complicated UTI (urinary tract infection) 09/2019   Phimoses, acute urinary retention, entoerococcus UTI, enterococc bacteremia.  F/u blood clx's neg x 5d.     COPD    Debilitated patient    Diabetes mellitus type 2 with complications (HCC)    HbA1c jumped from 5.7% to 6.1% 03/2015---started metformin  at that time.  DM 2 dx by fasting gluc criteria 2018.  Has chronic neuropath pain   Enterococcal bacteremia 09/2019   Phimoses, acute urinary retention, entoerococcus UTI, enterococc bacteremia.  F/u blood clx's neg x 5d.     Essential hypertension, benign    Fatty liver 2007   2007 u/s showed fatty liver with hepatosplenomegaly.  2019 repeat u/s->fatty liver but no cirrhosis or hepatosplenomegaly.   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 Fleeta Rothman)   Iron deficiency anemia    09/2022.  Hemoccults x 3 neg Mar 2024.  Improving with oral iron. GI following, no endoscopies   Lumbosacral neuritis    Lumbosacral spondylosis    Lumbar spinal stenosis with neurogenic claudication--contributes to his chronic pain syndrome   Morbid obesity (HCC)    Normal memory function 08/2014   Neuropsychological testing ([facility]): no cognitive impairment or sign of neurodegenerative disorder.   Likely has adjustment d/o with mixed anxiety/depressed features and may benefit from low dose SNRI.     Normocytic anemia 03/2016   Mild-pt needs ferritin and vit B12 level checked (as of 03/22/16). Hb stable 09/2019.  Hemoccult x 3 NEG 03/2022.  Hemoccults NEG x 03 Sep 2022   NSTEMI (non-ST elevated myocardial infarction) (HCC) 03/20/2016   BMS to RCA  Obesity hypoventilation syndrome (HCC)    Orthostatic hypotension    OSA on CPAP    8 cm H2O   OSTEOARTHRITIS    Osteomyelitis of left foot (HCC) 05/03/2023   Paroxysmal atrial fibrillation (HCC) 2003    (? chronic?) Off anticoag for a while due to falls.  Then apixaban  started 12/2014.   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 Clorinda): neuropathic pain->diff to treat, failed nucynta , failed spinal stimul trial, oxycontin  hs + tramadol  + gabap as of 12/2017 f/u Dr. Carilyn.   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   Sick sinus syndrome (HCC)    PPM placed   Thrombocytopenia (HCC) 2018   HSM on 2007 abd u/s---suspect some mild splenic sequestration chronically.   Unspecified glaucoma(365.9)    Unspecified hereditary and idiopathic peripheral neuropathy approx age 89   bilat LE's, ? left arm, too.  Feet became progressively  numb + left foot pain intermittently.  Pt may be trying a spinal stimulator (as of 05/2015)   Vaccine counseling 11/16/2021   VENOUS INSUFFICIENCY    Being followed by Dr. Harden as of 10/2016 for two R LL venous stasis ulcers/skin tears.  Healed as of 10/30/16 f/u with Dr. Harden.   Venous stasis dermatitis 12/27/2021   VENTRAL HERNIA     Family History  Problem Relation Age of Onset   Hypertension Mother    Coronary artery disease Mother    Heart attack Mother    Neuropathy Mother    Pulmonary fibrosis Father        asbestosis    Past Surgical History:  Procedure Laterality Date   AMPUTATION Left 04/11/2013   Procedure: AMPUTATION DIGIT Left 3rd toe;  Surgeon: Jerona LULLA Harden, MD;  Location: [facility] OR;  Service: Orthopedics;  Laterality: Left;  Left 3rd toe amputation at [facility]   AMPUTATION Left 08/29/2019   Procedure: LEFT TRANSMETATARSAL AMPUTATION;  Surgeon: Harden Jerona LULLA, MD;  Location: [facility] OR;  Service: Orthopedics;  Laterality: Left;   BIOPSY  05/04/2020   Procedure: BIOPSY;  Surgeon: Charlanne Groom, MD;  Location: [facility] ENDOSCOPY;  Service: Endoscopy;;   CARDIAC CATHETERIZATION  1997; 03/10/16   1997 Non-obstructive disease.  03/2016 BMS to RCA, with 25% pDiag dz, o/w normal cors per cath 03/07/16.  Cath 11/01/16: in stent restenosis, successful baloon angioplasty. 08/2020 branch vessel dz, cont medical therapy rec'd.   CARDIAC CATHETERIZATION  12/24/2012   mild < 20% LCx, prox 30% RCA; LVEF 55-65% , moderate pulmonary HTN, moderate AS   CARDIAC DEFIBRILLATOR PLACEMENT  11/07/2017   Claria MRI Quad CRT defibrillator   CARDIOVASCULAR STRESS TEST  05/11/16 ([facility])   2017 Myocardial perfusion imaging:  No ischemia; scar in apex, global hypokinesis, EF 36%.  06/15/20->mod primarily fixed inferolat wall defect  mildly worse with stress c/w infarct/scar with mild peri-infarct ischemia, normal EF-->for cath per cards.   Carotid dopplers  03/09/2016   [facility]: no hemodynamically significant stenosis on either  side.   CHOLECYSTECTOMY     COLONOSCOPY [name] 10/30/2012   Procedure: COLONOSCOPY;  Surgeon: Lupita FORBES Commander, MD;  Location: [facility] ENDOSCOPY;  Service: Endoscopy;  Laterality: [name];   CORONARY ANGIOPLASTY WITH STENT PLACEMENT  03/2016; 04/2017   2017-[facility]: BMS to RCA-pt was placed on Brilinta.  04/2017: DES to RCA.   DORSAL SLIT [name] 10/29/2019   for severe phimosis. Procedure: DORSAL SLIT;  Surgeon: Alvaro Hummer, MD;  Location: [facility] ORS;  Service: Urology;  Laterality: [name];  45 MINS   ESOPHAGEAL DILATION  05/04/2020   Procedure: ESOPHAGEAL DILATION;  Surgeon: Charlanne Groom, MD;  Location[facility] ENDOSCOPY;  Service: Endoscopy;;   ESOPHAGOGASTRODUODENOSCOPY (EGD) WITH PROPOFOL  [name] 05/04/2020   Procedure: ESOPHAGOGASTRODUODENOSCOPY (EGD) WITH PROPOFOL ;  Surgeon: Charlanne Groom, MD;  Location: [facility] ENDOSCOPY;  Service: Endoscopy;  Laterality: [name];   EYE SURGERY Bilateral cataract   HEMORRHOID SURGERY     INTRAOCULAR LENS INSERTION Bilateral    KNEE SURGERY Right    LEFT AND RIGHT HEART CATHETERIZATION WITH CORONARY ANGIOGRAM [name] 12/24/2012   Procedure: LEFT AND RIGHT HEART CATHETERIZATION WITH CORONARY ANGIOGRAM;  Surgeon: Peter M Swaziland, MD;  Location: [facility] C[facility] LAB;  Service: Cardiovascular;  Laterality: [name];   LEG SURGERY Bilateral    lenghtening    PACEMAKER PLACEMENT  04/13/2016   2nd deg HB after TAVR, pt had DC MDT PPM placed.   SHOULDER ARTHROSCOPY  08/30/2011   Procedure: ARTHROSCOPY SHOULDER;  Surgeon: Jerona LULLA Sage, MD;  Location: [facility] OR;  Service: Orthopedics;  Laterality: Right;  Right Shoulder Arthroscopy, Debridement, and Decompression   SPINAL CORD STIMULATOR INSERTION [name] 09/10/2015   Procedure: LUMBAR SPINAL CORD STIMULATOR INSERTION;  Surgeon: Deward Fabian, MD;  Location: [facility] NEURO ORS;  Service: Neurosurgery;  Laterality: [name];   TEE WITHOUT CARDIOVERSION [name] 09/16/2019   Procedure: TRANSESOPHAGEAL ECHOCARDIOGRAM (TEE);  Surgeon: Raford Riggs, MD;  Location: [facility] ENDOSCOPY;  Service:  Cardiovascular;  Laterality: [name];   TEE WITHOUT CARDIOVERSION [name] 12/02/2019   +vegetation on AVR and pacer lead in RV.  EF 55-60%, normal wall motion.  Valves function normal.  Procedure: TRANSESOPHAGEAL ECHOCARDIOGRAM (TEE);  Surgeon: Barbaraann Darryle Ned, MD;  Location: [facility] ENDOSCOPY;  Service: Cardiovascular;  Laterality: [name];   TOE AMPUTATION Left    due to osteomyelitis.  R big toe surg due to osteoarth   TONSILLECTOMY     traeculectomy Left    eye   TRANSCATHETER AORTIC VALVE REPLACEMENT, TRANSFEMORAL  04/11/2016   TRANSESOPHAGEAL ECHOCARDIOGRAM  03/09/2016; 09/2019   [facility]: EF 55-60%, PFO seen with bi-directional shunting, no thrombus in appendage.  09/2019 ->no valvular vegetations. Small patent foramen ovale with predominantly left to right shunting across the interatrial septum.   TRANSTHORACIC ECHOCARDIOGRAM  01/2015; 01/2016; 05/18/16; 09/18/16, 05/2017, 08/2017   01/2015 No signif change in aortic stenosis (moderate).  01/2016 Severe LVH w/small LV cavity, EF 60-65%, grade I diast dysfxn.  05/2016 (s/p TAVR): EF 50-55%, grd I DD, biopros AV good.  08/2016--EF 50-55%, LV septal motion c/w conduction abnl, grd I DD,mild MS,bioprosth aortic valve well seated, w/trace AR. 05/2017 TTE EF 35%. 08/2017-EF 35%, mod diff hypokin LV, grd I DD, biopros AV good.    TRANSTHORACIC ECHOCARDIOGRAM  04/2018; 09/2019   04/2018: EF 40-45%, mod diffuse LV hypokin, grd I DD, bioprosth AV well seated, no AS or AR. 09/2019  EF 60-65%, grd I DD, valves fine, including bioprosth AV. 04/29/20 (tech diff) EF 55-60%, grd I DD, vegetation on MV.  07/2021 EF 55-60% (s/p upgrade to BiV PPM), AV well seated.   VITRECTOMY     Social History   Occupational History   Occupation: Public relations account executive    Comment: retired  Tobacco Use   Smoking status: Never    Passive exposure: Never   Smokeless tobacco: Never  Vaping Use   Vaping status: Never Used  Substance and Sexual Activity   Alcohol  use: No    Alcohol /week: 0.0 standard  drinks of alcohol    Drug use: No    Types: Oxycodone    Sexual activity: Not Currently

## 2024-02-19 ENCOUNTER — Other Ambulatory Visit: Payer: Self-pay | Admitting: Family Medicine

## 2024-02-19 MED ORDER — FERROUS GLUCONATE 324 (38 FE) MG PO TABS: 324.0000 mg | ORAL_TABLET | Freq: Two times a day (BID) | ORAL | 1 refills | Status: DC

## 2024-02-19 MED ORDER — FINASTERIDE 5 MG PO TABS: 5.0000 mg | ORAL_TABLET | Freq: Every day | ORAL | 0 refills | Status: DC

## 2024-02-19 NOTE — Telephone Encounter (Signed)
 Copied from CRM #[id number]. Topic: Clinical - Medication Refill >> Feb 19, 2024  1:38 PM Leah C wrote: Medication: finasteride  (PROSCAR ) 5 MG tablet, ferrous gluconate  (FERGON) 324 MG tablet  Has the patient contacted their pharmacy? Yes and contacted pharmacy. Patients needed to schedule an appointment for refill   This is the patient's preferred pharmacy:  [facility] [phone] - [place],  - [address] [place] [place] [address] Phone: [phone] Fax: [phone]   Is this the correct pharmacy for this prescription? Yes If no, delete pharmacy and type the correct one.   Has the prescription been filled recently? No  Is the patient out of the medication? Yes  Has the patient been seen for an appointment in the last year OR does the patient have an upcoming appointment? Yes, Mar 06 2024 02:40 PM - Office Visit [facility] - Aleene VEAR Hockey, MD  Can we respond through MyChart? Yes  Agent: Please be advised that Rx refills may take up to 3 business days. We ask that you follow-up with your pharmacy.

## 2024-02-28 ENCOUNTER — Ambulatory Visit: Admitting: Physician Assistant

## 2024-02-28 DIAGNOSIS — L97521 Non-pressure chronic ulcer of other part of left foot limited to breakdown of skin: Secondary | ICD-10-CM

## 2024-02-28 DIAGNOSIS — L03115 Cellulitis of right lower limb: Secondary | ICD-10-CM

## 2024-02-28 NOTE — Progress Notes (Unsigned)
 Office Visit Note   Patient: Tim [name]           Date of Birth: 02-Jan-1941           MRN: [id number] Visit Date: 02/28/2024              Requested by: [name], MD [address] [place],  [place] [address] PCP: [name], MD  Chief Complaint  Patient presents with   Right Ankle - Wound Check      HPI: The patient is an 83 year old gentleman who is well-known to our office who presents today for evaluation of the right foot and ankle after a mechanical fall several weeks ago, he thinks about 4 weeks ago. Unsure of mechanism of injury, may have twisted foot or ankle.  He has developed right lower leg increased edema and blistering.  He states he has become very weak in his upper and lower extremities.  He has moved the bedside toilet beside his recliner and has been sleeping in the recliner.  He just feel weak in general and has not been able to leave his house.    He has a PCP appointment on 03/06/24 and an infectious disease appointment.  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      Assessment & Plan: Visit Diagnoses:  1. Cellulitis of right lower extremity   2. Non-pressure chronic ulcer of other part of left foot limited to breakdown of skin (HCC)     Plan: Continue to use Vashe dressing daily after washing with soap and water .  Compression dressing with ace wraps and elevation when possible.  He states he sleeps in a recliner.    Follow-Up Instructions: Return in about 2 weeks (around 03/13/2024).   Ortho Exam  [name], oriented, no adenopathy, well-dressed, normal affect, normal respiratory effort.  He seems to get tired and pauses with talking.  He states he has global generalized weakness.   He appears ill Lateral right leg 6 x 5 cm and medially 3 x 5 cm both superficial flat wounds.  He has brawny skin changes with evidence of chronic venous insufficiency.  Moderate pitting edema in the right LE, minimal edema in the left LE.         Imaging: No results found. No images are attached to the encounter.  Labs: Lab Results  Component Value Date   HGBA1C 5.5 06/08/2023   HGBA1C 5.5 06/08/2023   HGBA1C 5.5 (A) 06/08/2023   HGBA1C 5.5 06/08/2023   ESRSEDRATE 14 03/29/2023   ESRSEDRATE 30 (H) 12/15/2021   ESRSEDRATE 34 (H) 11/24/2020   CRP <3.0 03/29/2023   CRP 1.3 11/24/2020   CRP 0.9 07/14/2020   LABURIC 3.5 (L) 09/10/2013   REPTSTATUS 12/15/2021 FINAL 12/14/2021   CULT  12/14/2021    NO GROWTH Performed at [facility], [address], [place], [place] [address]    [name] ENTEROCOCCUS FAECALIS 09/11/2019     Lab Results  Component Value Date   ALBUMIN 3.3 (A) 01/03/2024   ALBUMIN 2.8 (L) 12/15/2021   ALBUMIN 3.2 (L) 12/14/2021    Lab Results  Component Value Date   MG 1.7 05/07/2020   MG 1.6 (L) 05/06/2020   MG 1.8 05/05/2020   No results found for: VD25OH  No results found for: PREALBUMIN    Latest Ref Rng & Units 01/03/2024   12:00 AM 06/08/2023    3:30 PM 03/29/2023  2:10 PM  CBC EXTENDED  WBC  5.4     5.4  5.8   RBC 3.87 - 5.11 4.13     4.21  4.16   Hemoglobin 13.5 - 17.5 13.3     13.4  12.8   HCT 41 - 53 41     39.7  39.5   Platelets 150 - 400 K/uL 89     104  131   NEUT#  2.70      3,225   Lymph# 850 - 3,900 cells/uL   1,833      This result is from an external source.     There is no height or weight on file to calculate BMI.  Orders:  No orders of the defined types were placed in this encounter.  No orders of the defined types were placed in this encounter.    Procedures: No procedures performed  Clinical Data: No additional findings.  ROS:  All other systems negative, except as noted in the HPI. Review of Systems  Objective: Vital Signs: There were no vitals taken for this visit.  Specialty Comments:  No specialty comments available.  PMFS History: Patient Active Problem List   Diagnosis Date Noted   Osteomyelitis of left foot (HCC)  05/03/2023   Venous stasis dermatitis 12/27/2021   Cellulitis 12/14/2021   Wound of right lower extremity 12/14/2021   Transient hypotension 12/14/2021   Heart failure with preserved ejection fraction (HCC) 12/14/2021   Prediabetes 12/14/2021   Thrombocytopenia (HCC) 12/14/2021   Obesity, Class III, BMI 40-49.9 (morbid obesity) 12/14/2021   BPH (benign prostatic hyperplasia) 12/14/2021   Vaccine counseling 11/16/2021   Weakness 02/23/2021   Physical deconditioning 02/23/2021   Rhinitis 07/14/2020   Mitral valve vegetation 05/28/2020   Non-pressure chronic ulcer of other part of left foot limited to breakdown of skin (HCC)    Esophageal dysphagia    Bacteremia    Endocarditis of mitral valve    Fever 04/28/2020   Cervical lymphadenopathy 03/15/2020   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   ICD (implantable cardioverter-defibrillator) infection (HCC) 12/22/2019   Gram-positive bacteremia 11/27/2019   FUO (fever of unknown origin) 11/26/2019   Low blood pressure reading 11/05/2019   Wound dehiscence 10/22/2019   PFO (patent foramen ovale) 10/06/2019   ICD (implantable cardioverter-defibrillator) battery depletion    Pressure injury of skin 09/13/2019   Enterococcal bacteremia history  09/13/2019   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   Urinary retention 09/13/2019   Phimosis 09/13/2019   SIRS (systemic inflammatory response syndrome) (HCC) 09/12/2019   Sepsis (HCC) 09/12/2019   Enterococcus UTI 09/12/2019   Cellulitis of foot    History of transmetatarsal amputation of left foot (HCC)    Subacute osteomyelitis, left ankle and foot (HCC)    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) 04/17/2018   Diabetic neuropathy, painful (HCC) 12/26/2016   Neuropathic pain of foot, left 06/20/2016   Insulin  resistance 07/22/2015   Status post  amputation of left great toe (HCC) 06/08/2015   Spinal stenosis of lumbar region 02/16/2015   Hereditary and idiopathic peripheral neuropathy 01/12/2015   Oral thrush 12/28/2014   Orthostatic dizziness 05/24/2014   Paresthesias with subjective weakness 01/30/2014   Bilateral thigh pain 01/30/2014   Abdominal wall pain in right upper quadrant 01/22/2014   Gross hematuria 09/25/2013   Intertrigo 09/25/2013   IBS (irritable bowel syndrome) 09/11/2013   Chronic pain  syndrome 08/06/2013   CAD (coronary artery disease), native coronary artery 12/25/2012   Paroxysmal atrial fibrillation (HCC) 12/25/2012   Moderate aortic stenosis 12/25/2012   Aortic stenosis 12/24/2012   History of colonic polyps 10/30/2012   Diverticulosis of colon 10/30/2012   Internal hemorrhoids 10/30/2012   Change in bowel habits intermittent loose stools 10/30/2012   Routine health maintenance 06/09/2012   Hereditary peripheral neuropathy 10/10/2011   Long term current use of anticoagulant - apixiban 08/16/2010   Dyspnea on exertion 09/08/2009   HYPERCHOLESTEROLEMIA-PURE 01/13/2009   Essential hypertension, benign 01/13/2009   EDEMA 01/13/2009   GOUT 01/12/2009   GLAUCOMA 01/12/2009   Venous insufficiency (chronic) (peripheral) 01/12/2009   COPD (chronic obstructive pulmonary disease) (HCC) 01/12/2009   VENTRAL HERNIA 01/12/2009   OSTEOARTHRITIS 01/12/2009   ARTHRITIS 01/12/2009   DEGENERATIVE DISC DISEASE 01/12/2009   CATARACT, HX OF 01/12/2009   HEART MURMUR, HX OF 01/12/2009   BENIGN PROSTATIC HYPERTROPHY, HX OF 01/12/2009   Obesity 01/17/2007   OBSTRUCTIVE SLEEP APNEA 01/17/2007   ASTHMA 01/17/2007   GERD 01/17/2007   HALLUX VALGUS, ACQUIRED 01/17/2007   Pulmonary hypertension (HCC) 01/17/2007   Past Medical History:  Diagnosis Date   AICD (automatic cardioverter/defibrillator) present    Balanitis    +severe phimosis+ buried penis->circ not possible but dorsal slit done 10/2019   BPH (benign  prostatic hypertrophy)    with urinary retention.  Renal u/s 12/04/19 NORMAL KIDNEYS, NORMAL BLADDER, NO HYDRONEPHROSIS   CAD (coronary artery disease)    Nonobstructive by 12/2012 cath; then 03/2016 he required BMS to RCA ([facility]).  In-stent restenosis on cath 11/01/16, baloon angioplasty successful.   CATARACT, HX OF    Chronic combined systolic and diastolic CHF (congestive heart failure) (HCC) 05/2016   Ischemic CM.  04/2018 EF 40-45%, grd I DD.  07/2021 EF 55-60%, AV well seated   Chronic pain syndrome    Lumbar DDD; chronic neuropathic pain (DM); has spinal stimulator and sees pain mgmt MD   Complicated UTI (urinary tract infection) 09/2019   Phimoses, acute urinary retention, entoerococcus UTI, enterococc bacteremia.  F/u blood clx's neg x 5d.     COPD    Debilitated patient    Diabetes mellitus type 2 with complications (HCC)    HbA1c jumped from 5.7% to 6.1% 03/2015---started metformin  at that time.  DM 2 dx by fasting gluc criteria 2018.  Has chronic neuropath pain   Enterococcal bacteremia 09/2019   Phimoses, acute urinary retention, entoerococcus UTI, enterococc bacteremia.  F/u blood clx's neg x 5d.     Essential hypertension, benign    Fatty liver 2007   2007 u/s showed fatty liver with hepatosplenomegaly.  2019 repeat u/s->fatty liver but no cirrhosis or hepatosplenomegaly.   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 Fleeta Rothman)   Iron deficiency anemia    09/2022.  Hemoccults x 3 neg Mar 2024.  Improving with oral iron. GI following, no endoscopies   Lumbosacral neuritis    Lumbosacral spondylosis    Lumbar spinal stenosis  with neurogenic claudication--contributes to his chronic pain syndrome   Morbid obesity (HCC)    Normal  memory function 08/2014   Neuropsychological testing ([facility]): no cognitive impairment or sign of neurodegenerative disorder.  Likely has adjustment d/o with mixed anxiety/depressed features and may benefit from low dose SNRI.     Normocytic anemia 03/2016   Mild-pt needs ferritin and vit B12 level checked (as of 03/22/16). Hb stable 09/2019.  Hemoccult x 3 NEG 03/2022.  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 chronic?) Off anticoag for a while due to falls.  Then apixaban  started 12/2014.   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 Clorinda): neuropathic pain->diff to treat, failed nucynta , failed spinal stimul trial, oxycontin  hs + tramadol  + gabap as of 12/2017 f/u Dr. Carilyn.   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   Sick sinus syndrome (HCC)    PPM placed   Thrombocytopenia (HCC) 2018    HSM on 2007 abd u/s---suspect some mild splenic sequestration chronically.   Unspecified glaucoma(365.9)    Unspecified hereditary and idiopathic peripheral neuropathy approx age 89   bilat LE's, ? left arm, too.  Feet became progressively numb + left foot pain intermittently.  Pt may be trying a spinal stimulator (as of 05/2015)   Vaccine counseling 11/16/2021   VENOUS INSUFFICIENCY    Being followed by Dr. Harden as of 10/2016 for two R LL venous stasis ulcers/skin tears.  Healed as of 10/30/16 f/u with Dr. Harden.   Venous stasis dermatitis 12/27/2021   VENTRAL HERNIA     Family History  Problem Relation Age of Onset   Hypertension Mother    Coronary artery disease Mother    Heart attack Mother    Neuropathy Mother    Pulmonary fibrosis Father        asbestosis    Past Surgical History:  Procedure Laterality Date   AMPUTATION Left 04/11/2013   Procedure: AMPUTATION DIGIT Left 3rd toe;  Surgeon: Jerona LULLA Harden, MD;  Location: [facility] OR;  Service: Orthopedics;  Laterality: Left;  Left 3rd toe amputation at [facility]   AMPUTATION Left 08/29/2019   Procedure: LEFT TRANSMETATARSAL AMPUTATION;  Surgeon: Harden Jerona LULLA, MD;  Location: [facility] OR;  Service: Orthopedics;  Laterality: Left;   BIOPSY  05/04/2020   Procedure: BIOPSY;  Surgeon: Charlanne Groom, MD;  Location: [facility] ENDOSCOPY;  Service: Endoscopy;;   CARDIAC CATHETERIZATION  1997; 03/10/16   1997 Non-obstructive disease.  03/2016 BMS to RCA, with 25% pDiag dz, o/w normal cors per cath 03/07/16.  Cath 11/01/16: in stent restenosis, successful baloon angioplasty. 08/2020 branch vessel dz, cont medical therapy rec'd.   CARDIAC CATHETERIZATION  12/24/2012   mild < 20% LCx, prox 30% RCA; LVEF 55-65% , moderate pulmonary HTN, moderate AS   CARDIAC DEFIBRILLATOR PLACEMENT  11/07/2017   Claria MRI Quad CRT defibrillator   CARDIOVASCULAR STRESS TEST  05/11/16 ([facility])   2017 Myocardial perfusion imaging:  No ischemia; scar in apex, global hypokinesis, EF 36%.   06/15/20->mod primarily fixed inferolat wall defect mildly worse with stress c/w infarct/scar with mild peri-infarct ischemia, normal EF-->for cath per cards.   Carotid dopplers  03/09/2016   [facility]: no hemodynamically significant stenosis on either side.   CHOLECYSTECTOMY     COLONOSCOPY [name] 10/30/2012   Procedure: COLONOSCOPY;  Surgeon: Lupita FORBES Commander, MD;  Location: [facility] ENDOSCOPY;  Service: Endoscopy;  Laterality: [name];   CORONARY ANGIOPLASTY WITH STENT PLACEMENT  03/2016; 04/2017   2017-[facility]: BMS to RCA-pt was placed on Brilinta.  04/2017: DES to RCA.   DORSAL SLIT [name] 10/29/2019   for severe phimosis. Procedure: DORSAL SLIT;  Surgeon: Alvaro Hummer, MD;  Location: [facility] ORS;  Service: Urology;  Laterality: [name];  45 MINS   ESOPHAGEAL DILATION  05/04/2020   Procedure: ESOPHAGEAL DILATION;  Surgeon: Charlanne Groom, MD;  Location: [facility] ENDOSCOPY;  Service: Endoscopy;;   ESOPHAGOGASTRODUODENOSCOPY (EGD) WITH PROPOFOL  [name] 05/04/2020   Procedure: ESOPHAGOGASTRODUODENOSCOPY (EGD) WITH PROPOFOL ;  Surgeon: Charlanne Groom, MD;  Location: [name] ENDOSCOPY;  Service: Endoscopy;  Laterality: [name];   EYE SURGERY Bilateral cataract   HEMORRHOID SURGERY     INTRAOCULAR LENS INSERTION Bilateral    KNEE SURGERY Right    LEFT AND RIGHT HEART CATHETERIZATION WITH CORONARY ANGIOGRAM [name] 12/24/2012   Procedure: LEFT AND RIGHT HEART CATHETERIZATION WITH CORONARY ANGIOGRAM;  Surgeon: Peter M Swaziland, MD;  Location: [facility] CATH LAB;  Service: Cardiovascular;  Laterality: [name];   LEG SURGERY Bilateral    lenghtening    PACEMAKER PLACEMENT  04/13/2016   2nd deg HB after TAVR, pt had DC MDT PPM placed.   SHOULDER ARTHROSCOPY  08/30/2011   Procedure: ARTHROSCOPY SHOULDER;  Surgeon: Jerona LULLA Sage, MD;  Location: [facility] OR;  Service: Orthopedics;  Laterality: Right;  Right Shoulder Arthroscopy, Debridement, and Decompression   SPINAL CORD STIMULATOR INSERTION [name] 09/10/2015   Procedure: LUMBAR SPINAL CORD STIMULATOR INSERTION;   Surgeon: Deward Fabian, MD;  Location: [facility] NEURO ORS;  Service: Neurosurgery;  Laterality: [name];   TEE WITHOUT CARDIOVERSION [name] 09/16/2019   Procedure: TRANSESOPHAGEAL ECHOCARDIOGRAM (TEE);  Surgeon: Raford Riggs, MD;  Location: [facility] ENDOSCOPY;  Service: Cardiovascular;  Laterality: [name];   TEE WITHOUT CARDIOVERSION [name] 12/02/2019   +vegetation on AVR and pacer lead in RV.  EF 55-60%, normal wall motion.  Valves function normal.  Procedure: TRANSESOPHAGEAL ECHOCARDIOGRAM (TEE);  Surgeon: Barbaraann Darryle Ned, MD;  Location: [facility] ENDOSCOPY;  Service: Cardiovascular;  Laterality: [name];   TOE AMPUTATION Left    due to osteomyelitis.  R big toe surg due to osteoarth   TONSILLECTOMY     traeculectomy Left    eye   TRANSCATHETER AORTIC VALVE REPLACEMENT, TRANSFEMORAL  04/11/2016   TRANSESOPHAGEAL ECHOCARDIOGRAM  03/09/2016; 09/2019   [facility]: EF 55-60%, PFO seen with bi-directional shunting, no thrombus in appendage.  09/2019 ->no valvular vegetations. Small patent foramen ovale with predominantly left to right shunting across the interatrial septum.   TRANSTHORACIC ECHOCARDIOGRAM  01/2015; 01/2016; 05/18/16; 09/18/16, 05/2017, 08/2017   01/2015 No signif change in aortic stenosis (moderate).  01/2016 Severe LVH w/small  LV cavity, EF 60-65%, grade I diast dysfxn.  05/2016 (s/p TAVR): EF 50-55%, grd I DD, biopros AV good.  08/2016--EF 50-55%, LV septal motion c/w conduction abnl, grd I DD,mild MS,bioprosth aortic valve well seated, w/trace AR. 05/2017 TTE EF 35%. 08/2017-EF 35%, mod diff hypokin LV, grd I DD, biopros AV good.    TRANSTHORACIC ECHOCARDIOGRAM  04/2018; 09/2019   04/2018: EF 40-45%, mod diffuse LV hypokin, grd I DD, bioprosth AV well seated, no AS or AR. 09/2019 EF 60-65%, grd I DD, valves fine, including bioprosth AV. 04/29/20 (tech diff) EF 55-60%, grd I DD, vegetation on MV.  07/2021 EF 55-60% (s/p upgrade to BiV PPM), AV well seated.   VITRECTOMY     Social History   Occupational History    Occupation: Public relations account executive    Comment: retired  Tobacco Use   Smoking status: Never    Passive exposure: Never   Smokeless tobacco: Never  Vaping Use   Vaping status: Never Used  Substance and Sexual Activity   Alcohol  use: No    Alcohol /week: 0.0 standard drinks of alcohol    Drug use: No    Types: Oxycodone    Sexual activity: Not Currently

## 2024-02-29 ENCOUNTER — Encounter: Payer: Self-pay | Admitting: Physician Assistant

## 2024-03-01 ENCOUNTER — Other Ambulatory Visit: Payer: Self-pay | Admitting: Family Medicine

## 2024-03-04 ENCOUNTER — Encounter: Payer: Self-pay | Admitting: Family

## 2024-03-06 ENCOUNTER — Encounter: Payer: Self-pay | Admitting: Family Medicine

## 2024-03-06 ENCOUNTER — Ambulatory Visit: Admitting: Family Medicine

## 2024-03-06 VITALS — BP 100/60 | HR 96 | Temp 97.4°F | Ht 76.0 in | Wt 277.0 lb

## 2024-03-06 DIAGNOSIS — R7401 Elevation of levels of liver transaminase levels: Secondary | ICD-10-CM

## 2024-03-06 DIAGNOSIS — R748 Abnormal levels of other serum enzymes: Secondary | ICD-10-CM | POA: Diagnosis not present

## 2024-03-06 DIAGNOSIS — R29898 Other symptoms and signs involving the musculoskeletal system: Secondary | ICD-10-CM

## 2024-03-06 DIAGNOSIS — M6281 Muscle weakness (generalized): Secondary | ICD-10-CM | POA: Diagnosis not present

## 2024-03-06 DIAGNOSIS — E1142 Type 2 diabetes mellitus with diabetic polyneuropathy: Secondary | ICD-10-CM

## 2024-03-06 DIAGNOSIS — R251 Tremor, unspecified: Secondary | ICD-10-CM

## 2024-03-06 DIAGNOSIS — Z23 Encounter for immunization: Secondary | ICD-10-CM | POA: Diagnosis not present

## 2024-03-06 MED ORDER — FLUTICASONE PROPIONATE 50 MCG/ACT NA SUSP: 2.0000 | Freq: Every day | NASAL | 6 refills | Status: DC

## 2024-03-06 NOTE — Progress Notes (Signed)
 OFFICE VISIT  03/06/2024  CC: No chief complaint on file.   Patient is a 83 y.o. male who presents for follow-up diabetes and wants to discuss neuromuscular changes recently.    INTERIM HX: Approximately 6 to 7 weeks ago he felt acute onset of some tremulousness in his arms, the tendency for him to lose complete strength in the arms and legs for a second at a time when trying to rise out of his chair.  He notes some rigidity and impaired full relaxation in both legs and both arms.  No pain.  No focal weakness.  No headaches.  No paresthesias.  No dizziness or vertigo. Also describes very brief and sudden visual phenomena: Bright flashes alternating with some dimming flashes. Says right ankle relaxes much more slowly after the foot is flexed.  Left middle finger with triggering, painful.  Says he has a round dent in the middle of his skull about the size of a silver dollar that is new.  No preceding trauma, no pain.  He was seen at orthopedics on 02/28/2024 and diagnosed with right lower leg cellulitis.  He was dispensed some topical medication from the orthopedist and says this is helping well.  ROS as above, plus--> no fevers, no CP, no SOB, no wheezing, no cough, no rashes, no melena/hematochezia.  No polyuria or polydipsia.  No myalgias or arthralgias. Right ear feels full on/off lately.  No dysuria or unusual/new urinary urgency or frequency. No n/v/d or abd pain.  No palpitations.     Past Medical History:  Diagnosis Date   AICD (automatic cardioverter/defibrillator) present    Balanitis    +severe phimosis+ buried penis->circ not possible but dorsal slit done 10/2019   BPH (benign prostatic hypertrophy)    with urinary retention.  Renal u/s 12/04/19 NORMAL KIDNEYS, NORMAL BLADDER, NO HYDRONEPHROSIS   CAD (coronary artery disease)    Nonobstructive by 12/2012 cath; then 03/2016 he required BMS to RCA ([facility]).  In-stent restenosis on cath 11/01/16, baloon angioplasty successful.    CATARACT, HX OF    Chronic combined systolic and diastolic CHF (congestive heart failure) (HCC) 05/2016   Ischemic CM.  04/2018 EF 40-45%, grd I DD.  07/2021 EF 55-60%, AV well seated   Chronic pain syndrome    Lumbar DDD; chronic neuropathic pain (DM); has spinal stimulator and sees pain mgmt MD   Complicated UTI (urinary tract infection) 09/2019   Phimoses, acute urinary retention, entoerococcus UTI, enterococc bacteremia.  F/u blood clx's neg x 5d.     COPD    Debilitated patient    Diabetes mellitus type 2 with complications (HCC)    HbA1c jumped from 5.7% to 6.1% 03/2015---started metformin  at that time.  DM 2 dx by fasting gluc criteria 2018.  Has chronic neuropath pain   Enterococcal bacteremia 09/2019   Phimoses, acute urinary retention, entoerococcus UTI, enterococc bacteremia.  F/u blood clx's neg x 5d.     Essential hypertension, benign    Fatty liver 2007   2007 u/s showed fatty liver with hepatosplenomegaly.  2019 repeat u/s->fatty liver but no cirrhosis or hepatosplenomegaly.   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 Fleeta Rothman)   Iron deficiency anemia    09/2022.  Hemoccults x 3 neg Mar 2024.  Improving with oral iron. GI following, no endoscopies   Lumbosacral neuritis    Lumbosacral spondylosis    Lumbar spinal stenosis with neurogenic claudication--contributes to his chronic pain syndrome   Morbid obesity (HCC)    Normal memory function 08/2014   Neuropsychological testing ([facility]): no cognitive impairment or sign of neurodegenerative disorder.  Likely has adjustment d/o with mixed  anxiety/depressed features and may benefit from low dose SNRI.     Normocytic anemia 03/2016   Mild-pt needs ferritin and vit B12 level checked (as of 03/22/16). Hb stable 09/2019.  Hemoccult x 3 NEG 03/2022.  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 chronic?) Off anticoag for a while due to falls.  Then apixaban  started 12/2014.   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 Clorinda): neuropathic pain->diff to treat, failed nucynta , failed spinal stimul trial, oxycontin  hs + tramadol  + gabap as of 12/2017 f/u Dr. Carilyn.   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   Sick sinus syndrome (HCC)    PPM placed   Thrombocytopenia (HCC) 2018   HSM on 2007 abd u/s---suspect some mild splenic sequestration chronically.   Unspecified glaucoma(365.9)    Unspecified hereditary and idiopathic peripheral neuropathy approx age 5   bilat LE's, ? left arm, too.  Feet became progressively numb + left foot pain intermittently.  Pt may  be trying a spinal stimulator (as of 05/2015)   Vaccine counseling 11/16/2021   VENOUS INSUFFICIENCY    Being followed by Dr. Harden as of 10/2016 for two R LL venous stasis ulcers/skin tears.  Healed as of 10/30/16 f/u with Dr. Harden.   Venous stasis dermatitis 12/27/2021   VENTRAL HERNIA     Past Surgical History:  Procedure Laterality Date   AMPUTATION Left 04/11/2013   Procedure: AMPUTATION DIGIT Left 3rd toe;  Surgeon: Jerona LULLA Harden, MD;  Location: [facility] OR;  Service: Orthopedics;  Laterality: Left;  Left 3rd toe amputation at [facility]   AMPUTATION Left 08/29/2019   Procedure: LEFT TRANSMETATARSAL AMPUTATION;  Surgeon: Harden Jerona LULLA, MD;  Location: [facility] OR;  Service: Orthopedics;  Laterality: Left;   BIOPSY  05/04/2020   Procedure: BIOPSY;  Surgeon: Charlanne Groom, MD;  Location: [facility] ENDOSCOPY;  Service: Endoscopy;;   CARDIAC CATHETERIZATION  1997; 03/10/16   1997  Non-obstructive disease.  03/2016 BMS to RCA, with 25% pDiag dz, o/w normal cors per cath 03/07/16.  Cath 11/01/16: in stent restenosis, successful baloon angioplasty. 08/2020 branch vessel dz, cont medical therapy rec'd.   CARDIAC CATHETERIZATION  12/24/2012   mild < 20% LCx, prox 30% RCA; LVEF 55-65% , moderate pulmonary HTN, moderate AS   CARDIAC DEFIBRILLATOR PLACEMENT  11/07/2017   [facility]aria MRI Quad CRT defibrillator   CARDIOVASCULAR STRESS TEST  05/11/16 ([facility])   2017 Myocardial perfusion imaging:  No ischemia; scar in apex, global hypokinesis, EF 36%.  06/15/20->mod primarily fixed inferolat wall defect mildly worse with stress c/w infarct/scar with mild peri-infarct ischemia, normal EF-->for cath per cards.   Carotid dopplers  03/09/2016   [facility]: no hemodynamically significant stenosis on either side.   CHOLECYSTECTOMY     COLONOSCOPY [name] 10/30/2012   Procedure: COLONOSCOPY;  Surgeon: Lupita FORBES Commander, MD;  Location: [facility] ENDOSCOPY;  Service: Endoscopy;  Laterality: [name];   CORONARY ANGIOPLASTY WITH STENT PLACEMENT  03/2016; 04/2017    2017-[facility]: BMS to RCA-pt was placed on Brilinta.  04/2017: DES to RCA.   DORSAL SLIT [name] 10/29/2019   for severe phimosis. Procedure: DORSAL SLIT;  Surgeon: Alvaro Hummer, MD;  Location: [facility] ORS;  Service: Urology;  Laterality: [name];  45 MINS   ESOPHAGEAL DILATION  05/04/2020   Procedure: ESOPHAGEAL DILATION;  Surgeon: Charlanne Groom, MD;  Location: [facility] ENDOSCOPY;  Service: Endoscopy;;   ESOPHAGOGASTRODUODENOSCOPY (EGD) WITH PROPOFOL  [name] 05/04/2020   Procedure: ESOPHAGOGASTRODUODENOSCOPY (EGD) WITH PROPOFOL ;  Surgeon: Charlanne Groom, MD;  Location: [facility] ENDOSCOPY;  Service: Endoscopy;  Laterality: [name];   EYE SURGERY Bilateral cataract   HEMORRHOID SURGERY     INTRAOCULAR LENS INSERTION Bilateral    KNEE SURGERY Right    LEFT AND RIGHT HEART CATHETERIZATION WITH CORONARY ANGIOGRAM [name] 12/24/2012   Procedure: LEFT AND RIGHT HEART CATHETERIZATION WITH CORONARY ANGIOGRAM;  Surgeon: Peter M Swaziland, MD;  Location: [facility] CATH LAB;  Service: Cardiovascular;  Laterality: [name];   LEG SURGERY Bilateral    lenghtening    PACEMAKER PLACEMENT  04/13/2016   2nd deg HB after TAVR, pt had DC MDT PPM placed.   SHOULDER ARTHROSCOPY  08/30/2011   Procedure: ARTHROSCOPY SHOULDER;  Surgeon: Jerona LULLA Sage, MD;  Location: [facility] OR;  Service: Orthopedics;  Laterality: Right;  Right Shoulder Arthroscopy, Debridement, and Decompression   SPINAL CORD STIMULATOR INSERTION [name] 09/10/2015   Procedure: LUMBAR SPINAL CORD STIMULATOR INSERTION;  Surgeon: Deward Fabian, MD;  Location: [facility] NEURO ORS;  Service: Neurosurgery;  Laterality: [name];   TEE WITHOUT CARDIOVERSION [name] 09/16/2019   Procedure: TRANSESOPHAGEAL ECHOCARDIOGRAM (TEE);  Surgeon: Raford Riggs, MD;  Location: [facility] ENDOSCOPY;  Service: Cardiovascular;  Laterality: [name];   TEE WITHOUT CARDIOVERSION [name] 12/02/2019   +vegetation on AVR and pacer lead in RV.  EF 55-60%, normal wall motion.  Valves function normal.  Procedure: TRANSESOPHAGEAL ECHOCARDIOGRAM (TEE);  Surgeon:  Barbaraann Darryle Ned, MD;  Location: [facility] ENDOSCOPY;  Service: Cardiovascular;  Laterality: [name];   TOE AMPUTATION Left    due to osteomyelitis.  R big toe surg due to osteoarth   TONSILLECTOMY     traeculectomy Left    eye   TRANSCATHETER AORTIC VALVE REPLACEMENT, TRANSFEMORAL  04/11/2016   TRANSESOPHAGEAL ECHOCARDIOGRAM  03/09/2016; 09/2019   [facility]: EF 55-60%, PFO seen with bi-directional shunting, no thrombus in appendage.  09/2019 ->no valvular vegetations. Small patent foramen ovale with predominantly left to right shunting across the interatrial septum.   TRANSTHORACIC ECHOCARDIOGRAM  01/2015; 01/2016; 05/18/16;  09/18/16, 05/2017, 08/2017   01/2015 No signif change in aortic stenosis (moderate).  01/2016 Severe LVH w/small LV cavity, EF 60-65%, grade I diast dysfxn.  05/2016 (s/p TAVR): EF 50-55%, grd I DD, biopros AV good.  08/2016--EF 50-55%, LV septal motion c/w conduction abnl, grd I DD,mild MS,bioprosth aortic valve well seated, w/trace AR. 05/2017 TTE EF 35%. 08/2017-EF 35%, mod diff hypokin LV, grd I DD, biopros AV good.    TRANSTHORACIC ECHOCARDIOGRAM  04/2018; 09/2019   04/2018: EF 40-45%, mod diffuse LV hypokin, grd I DD, bioprosth AV well seated, no AS or AR. 09/2019 EF 60-65%, grd I DD, valves fine, including bioprosth AV. 04/29/20 (tech diff) EF 55-60%, grd I DD, vegetation on MV.  07/2021 EF 55-60% (s/p upgrade to BiV PPM), AV well seated.   VITRECTOMY      Outpatient Medications Prior to Visit  Medication Sig Dispense Refill   albuterol  (VENTOLIN  HFA) 108 (90 Base) MCG/ACT inhaler TAKE 2 PUFFS BY MOUTH EVERY 6 HOURS AS NEEDED FOR WHEEZE OR SHORTNESS OF BREATH 18 each 0   allopurinol  (ZYLOPRIM ) 300 MG tablet Take 1 tablet (300 mg total) by mouth daily. with food 90 tablet 1   amoxicillin  (AMOXIL ) 500 MG capsule Take 2 capsules (1,000 mg total) by mouth 2 (two) times daily. 120 capsule 11   apixaban  (ELIQUIS ) 5 MG TABS tablet Take 5 mg by mouth 2 (two) times daily.     ASPIRIN  LOW DOSE 81  MG EC tablet Take 81 mg by mouth daily.     B Complex-C (B-COMPLEX WITH VITAMIN C ) tablet Take 1 tablet by mouth daily. Take 1 tablet daily, 250mg      ezetimibe  (ZETIA ) 10 MG tablet Take 1 tablet (10 mg total) by mouth daily. 30 tablet 1   ferrous gluconate  (FERGON) 324 MG tablet Take 1 tablet (324 mg total) by mouth 2 (two) times daily. 90 tablet 1   finasteride  (PROSCAR ) 5 MG tablet Take 1 tablet (5 mg total) by mouth daily. 90 tablet 0   fluticasone -salmeterol (WIXELA INHUB) 250-50 MCG/ACT AEPB Inhale 1 puff into the lungs 2 (two) times daily. in the morning and at bedtime. 60 each 5   gabapentin  (NEURONTIN ) 800 MG tablet TAKE 1 TABLET BY MOUTH FOUR TIMES A DAY 360 tablet 1   Loratadine (CLARITIN PO) Take 1 tablet by mouth in the morning and at bedtime.     mometasone -formoterol  (DULERA ) 100-5 MCG/ACT AERO Inhale 2 puffs into the lungs as needed for wheezing or shortness of breath.     Multiple Vitamin (MULTIVITAMIN ADULT PO) Take 1 tablet by mouth daily.     naloxone  (NARCAN ) nasal spray 4 mg/0.1 mL One spray in nostril as needed for opoid overdose.  May repeat dose in 5 min if needed. 2 each 1   niacin  (NIASPAN ) 1000 MG CR tablet TAKE 1 TABLET BY MOUTH TWICE A DAY 180 tablet 1   nitroGLYCERIN  (NITROSTAT ) 0.4 MG SL tablet Place 0.4 mg under the tongue every 5 (five) minutes as needed for chest pain.     Oxycodone  HCl 20 MG TABS 1 tab po bid prn severe pain 45 tablet 0   pantoprazole  (PROTONIX ) 40 MG tablet Take 1 tablet (40 mg total) by mouth daily. (Patient taking differently: Take 40 mg by mouth 2 (two) times daily.) 180 tablet 1   potassium chloride  SA (KLOR-CON  M) 20 MEQ tablet Take 1 tablet (20 mEq total) by mouth daily. 90 tablet 1   rosuvastatin  (CRESTOR ) 20 MG tablet Take 20  mg by mouth.     torsemide  (DEMADEX ) 20 MG tablet Take 1 tablet (20 mg total) by mouth 2 (two) times daily. 180 tablet 1   vitamin C  (ASCORBIC ACID) 250 MG tablet Take 250 mg by mouth daily.     VITAMIN D,  CHOLECALCIFEROL, PO Take 1 tablet by mouth daily.      diclofenac  (VOLTAREN ) 75 MG EC tablet Take 1 tablet (75 mg total) by mouth 2 (two) times daily. 60 tablet 0   ipratropium (ATROVENT ) 0.03 % nasal spray 2 SPRAY EACH NOSTRIL EVERY 12 HOURS FOR RUNNY NOSE 30 mL 2   Oxymetazoline HCl (NASAL SPRAY NA) Place into the nose.     No facility-administered medications prior to visit.    Allergies  Allergen Reactions   Brimonidine Tartrate Shortness Of Breath    Alphagan-Shortness of breath   Brinzolamide Shortness Of Breath    AZOPT- Shortness of breath   Latanoprost Shortness Of Breath    XALATAN- Shortness of breath   Nucynta  [Tapentadol ] Shortness Of Breath   Sulfa Antibiotics Palpitations   Timolol Maleate Shortness Of Breath and Other (See Comments)    TIMOPTIC- Aggravated asthma   Diltiazem Swelling     leg swelling   Rofecoxib Swelling     VIOXX- leg swelling   Vancomycin Rash and Other (See Comments)    Blisters   Codeine Other (See Comments)    Childhood reaction   Tamsulosin Other (See Comments)    Dizziness    Celecoxib Other (See Comments)    CELLBREX-confusion   Colchicine Diarrhea    diarrhea   Tape Rash    Review of Systems As per HPI  PE:    03/06/2024    2:34 PM 09/27/2023    6:17 PM 06/08/2023    3:01 PM  Vitals with BMI  Height 6' 4    Weight 277 lbs  278 lbs 3 oz  BMI 33.73    Systolic 100 114 865  Diastolic 60 68 84  Pulse 96 77 92    Physical Exam  Gen: Alert, well appearing, sitting up in wheelchair.  Patient is oriented to person, place, time, and situation. Eyes: No icterus.  Extraocular movements intact.  No nystagmus.  Pupils equal and round and reactive to light. Ears: Canals are clear, tympanic membranes a bit dull but otherwise normal. Oropharynx with no erythema, swelling, or exudate.  Mucosa is moist. Tongue is midline.  Facial muscles symmetric upper and lower. He has mild tremulousness of the hands when arms are held outstretched  in front of him, left a bit worse than the right.  No intention tremor.  No cogwheeling of the arms or legs.  Range of motion of the shoulders and hips are a bit impaired due to body habitus.  Strength 5 out of 5 proximally distally bilaterally in arms and legs.  DTRs in patellar regions 2+ bilaterally.   LABS:  Last CBC Lab Results  Component Value Date   WBC 5.4 01/03/2024   HGB 13.3 (A) 01/03/2024   HCT 41 01/03/2024   MCV 94.3 06/08/2023   MCH 31.8 06/08/2023   RDW 13.3 06/08/2023   PLT 89 (A) 01/03/2024   Last metabolic panel Lab Results  Component Value Date   GLUCOSE 146 (H) 06/08/2023   NA 143 01/03/2024   K 4.0 01/03/2024   CL 99 01/03/2024   CO2 28 (A) 01/03/2024   BUN 24 (A) 01/03/2024   CREATININE 1.1 01/03/2024   EGFR 65 01/03/2024  CALCIUM  8.8 01/03/2024   PROT 5.0 (L) 12/08/2022   ALBUMIN 3.3 (A) 01/03/2024   BILITOT 0.6 12/08/2022   ALKPHOS 115 01/03/2024   AST 122 (A) 01/03/2024   ALT 53 (A) 01/03/2024   ANIONGAP 10 12/15/2021   Last lipids Lab Results  Component Value Date   CHOL 76 01/03/2024   HDL 58 02/10/2022   LDLCALC 43 02/10/2022   TRIG 65 02/10/2022   CHOLHDL 2.0 02/10/2022   Last hemoglobin A1c Lab Results  Component Value Date   HGBA1C 5.5 06/08/2023   HGBA1C 5.5 06/08/2023   HGBA1C 5.5 (A) 06/08/2023   HGBA1C 5.5 06/08/2023   Last thyroid  functions Lab Results  Component Value Date   TSH 0.905 04/29/2020   T3TOTAL 89 09/16/2018   Last vitamin B12 and Folate Lab Results  Component Value Date   VITAMINB12 429 05/24/2016   IMPRESSION AND PLAN:  #1 diabetes with peripheral neuropathy. Well-controlled with diet alone. POC Hba1c today is 5.8%. Monitor renal function today.  2.  Subacute neuromuscular symptoms of unknown etiology (tremulousness, question of rigidity/impaired muscle relaxation, visual phenomena). Check general lab panel: CBC, c-Met, TSH, magnesium , vitamin B12, vitamin B1, vitamin B6.  3) IDA, hemoccults  neg 09/2022. Taking iron and tolerating well.  GI has seen him, endoscopy not recommended. Following cbc today.  4) Elev alk phos and elev transaminases on labs 02/12/24 (by his cardiologist). Suspect d/t fatty liver. CMET today.  An After Visit Summary was printed and given to the patient.  FOLLOW UP: Return in about 4 weeks (around 04/03/2024) for routine chronic illness f/u.  Signed:  Gerlene Hockey, MD           03/06/2024

## 2024-03-07 LAB — COMPREHENSIVE METABOLIC PANEL WITH GFR
ALT: 45 U/L (ref 0–53)
AST: 106 U/L — ABNORMAL HIGH (ref 0–37)
Albumin: 3.1 g/dL — ABNORMAL LOW (ref 3.5–5.2)
Alkaline Phosphatase: 99 U/L (ref 39–117)
BUN: 44 mg/dL — ABNORMAL HIGH (ref 6–23)
CO2: 31 meq/L (ref 19–32)
Calcium: 8.4 mg/dL (ref 8.4–10.5)
Chloride: 102 meq/L (ref 96–112)
Creatinine, Ser: 2.06 mg/dL — ABNORMAL HIGH (ref 0.40–1.50)
GFR: 29.08 mL/min — ABNORMAL LOW (ref >60.00)
Glucose, Bld: 124 mg/dL — ABNORMAL HIGH (ref 70–99)
Potassium: 4.7 meq/L (ref 3.5–5.1)
Sodium: 144 meq/L (ref 135–145)
Total Bilirubin: 0.8 mg/dL (ref 0.2–1.2)
Total Protein: 5.6 g/dL — ABNORMAL LOW (ref 6.0–8.3)

## 2024-03-07 LAB — VITAMIN B12: Vitamin B-12: 980 pg/mL — ABNORMAL HIGH (ref 211–911)

## 2024-03-07 LAB — CBC WITH DIFFERENTIAL/PLATELET
Basophils Absolute: 0.1 K/uL (ref 0.0–0.1)
Basophils Relative: 1.1 % (ref 0.0–3.0)
Eosinophils Absolute: 0.2 K/uL (ref 0.0–0.7)
Eosinophils Relative: 3.4 % (ref 0.0–5.0)
HCT: 36.2 % — ABNORMAL LOW (ref 39.0–52.0)
Hemoglobin: 12.1 g/dL — ABNORMAL LOW (ref 13.0–17.0)
Lymphocytes Relative: 20.6 % (ref 12.0–46.0)
Lymphs Abs: 1.2 K/uL (ref 0.7–4.0)
MCHC: 33.5 g/dL (ref 30.0–36.0)
MCV: 98.3 fl (ref 78.0–100.0)
Monocytes Absolute: 0.5 K/uL (ref 0.1–1.0)
Monocytes Relative: 8.6 % (ref 3.0–12.0)
Neutro Abs: 4 K/uL (ref 1.4–7.7)
Neutrophils Relative %: 66.3 % (ref 43.0–77.0)
Platelets: 91 K/uL — ABNORMAL LOW (ref 150.0–400.0)
RBC: 3.68 Mil/uL — ABNORMAL LOW (ref 4.22–5.81)
RDW: 15.3 % (ref 11.5–15.5)
WBC: 6 K/uL (ref 4.0–10.5)

## 2024-03-07 LAB — MAGNESIUM: Magnesium: 1.8 mg/dL (ref 1.5–2.5)

## 2024-03-07 LAB — TSH: TSH: 2.26 u[IU]/mL (ref 0.35–5.50)

## 2024-03-10 LAB — POCT GLYCOSYLATED HEMOGLOBIN (HGB A1C)
HbA1c POC (<> result, manual entry): 5.8 % (ref 4.0–5.6)
HbA1c, POC (controlled diabetic range): 5.8 % (ref 0.0–7.0)
HbA1c, POC (prediabetic range): 5.8 % (ref 5.7–6.4)
Hemoglobin A1C: 5.8 % — AB (ref 4.0–5.6)

## 2024-03-11 ENCOUNTER — Ambulatory Visit: Payer: Self-pay | Admitting: Family Medicine

## 2024-03-11 DIAGNOSIS — R7989 Other specified abnormal findings of blood chemistry: Secondary | ICD-10-CM

## 2024-03-11 LAB — VITAMIN B1: Vitamin B1 (Thiamine): 146 nmol/L — ABNORMAL HIGH (ref 8–30)

## 2024-03-11 NOTE — Progress Notes (Signed)
 OK, 45 oz fluids per day. OK to continue torsemide  every day. Ok for bmet 9/17.

## 2024-03-12 NOTE — Addendum Note (Signed)
 Addended by: FLETA CARE D on: 03/12/2024 11:31 AM   Modules accepted: Orders

## 2024-03-13 ENCOUNTER — Ambulatory Visit (INDEPENDENT_AMBULATORY_CARE_PROVIDER_SITE_OTHER): Admitting: Orthopedic Surgery

## 2024-03-13 DIAGNOSIS — L03115 Cellulitis of right lower limb: Secondary | ICD-10-CM | POA: Diagnosis not present

## 2024-03-16 NOTE — Progress Notes (Signed)
 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   MRN: [id number]  HPI  83 y.o. male who  initially presented with AMP sensitive enterococcal UTI complicated by enterococcal bacteremia in March 2021 in the setting of AICD and TAVR. TTE and TEE at the time were without evidence of vegetation and was prescribed 2 weeks of ampicillin . On follow up on 4/21 there was worsening of his amputation site with erythema and drainage. He was placed on doxycycline  by Dr. Harden which was then changed to Zyvox  by me. This resolved his symptoms. On follow up with myself  on 11/26/2019 he was experiencing fevers and dizziness with concern for systemic infection. Blood cultures in the ID clinic were positive for Enterococcus faecalis bacteremia. Surgical site was well healed at that time. 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 Recommended treatment was 6 weeks of ampicillin  and ceftriaxone  through 6/21. Feeling better on 6/21 and transitioned to life-long suppression with amoxicillin  three times daily.    Tim [name] was then seen on 7/15 /2021 seen by Dr. Eben with concern for cellulitis and was continued on his Amoxicillin . He followed up with Dr. Eben on 8/6 and continued to do well with Amoxicillin .   Blood cultures no growth but MV had vegetation. TEE was not successful.  He was retreated with ampicillin  high-dose and ceftriaxone  twice daily.  He completed 6 weeks of therapy and  was then on  high-dose amoxicillin  to 1000 g twice daily.  Interim history :    Followed very closely by Dr. Harden for his chronic surgical wound site where he had osteomyelitis in that area is been doing well according to him and also going to Dr. Crist notes.  He was then admitted to [facility] with infection of his foot having presented  with redness and drainage from the left TMA site. Blood cultures from admission were negative. CT of the left foot had cellulitis of the distal forefoot with abscess and acute osteomyelitis of the second third and fourth metatarsal stumps. There was also possible acute osteomyelitis of the first metatarsal stump.  On 1/12 he underwent I&D with revision of transmetatarsal amputation and bone biopsy.  Operative culture grew 1 colony of staph lugdinensis. There was concern for residual osteomyelitis  He was discharged to SNF on 1/22 on cefepime/flagyl with plans to continue through 2/22. He presented back to the ED 1/23 reporting inadequate care at the SNF. He was afebrile with WBC 7,300 and procalcitonin 0.16. He reported some queasiness and metallic taste in his mouth concerning for flagyl adverse events. He was changed to daptomycin  and ertapenem to finish his course and sent back to the facility on 07/28/2021  He followed up with Dr. Elia. He was also was seen in followup with Dr.Taylor with Podiatry with [facility] and later with Dr. Harden here in [place] who has been happy with his progress. After finishing his  IV antibiotics he went back to chronic high dose suppressive therapy with amoxicillin  1 gram po bid.   Tim [name] was  then admitted the hospital and was treated for possible cellulitis in the context of superimposed venous stasis changes.  He was given Zyvox  IV and then a supply of oral Zyvox  which was then extended by his primary care physician.   Tim [name] has continued to follow closely Dr. Harden  who has performed local debridements on his foot.  Last saw Tim [name] in March.   Then followed in Dr. Crist office and recently seen Rocky Hans as recently as June 2024.   Time he was struggling with an early potential infection in the right leg which is resolved.   We checked inflammatory markers which were completely normal we also schedule a follow-up appointment with me this month.  He is doing well.   The edema is still is present may be a tad worse but the erythema at the site is no worse and he has no systemic symptoms concerning for infection  Discussed the use of AI scribe software for clinical note transcription with the patient, who gave verbal consent to proceed.  History of Present Illness   Tim [name] is an 83 year old male who presents with blisters and boils on his leg. He is followed by Dr. Crist office closely     He has experienced a sudden appearance of boils and blisters on his leg, varying in size, for approximately four weeks. The etiology is unclear, but cellulitis was considered by another provider. He reports very little edema in his legs. he is not on NEW antibiotics.  He is on chronic amoxicillin  therapy now higher dose due to  previous breakthrough infection when on a lower dose. He takes the medication more than twice a day and has been on autofill for a long time. Recently, he faced an issue with the pharmacy refusing to refill the prescription until a later date, despite having the correct dosage through Sunday. He has previously experienced a shortage of amoxicillin , leading to a reduced supply.  He has a history of amputation on the left side, which is currently healed and not causing any issues. There is very little edema in his legs, and he denies any significant swelling that could have contributed to the blisters.  Recent blood work, including a CBC, showed mild anemia and low platelets, which have been consistent findings. His hemoglobin A1c was 5.8, indicating borderline diabetes. Kidney function was checked in August and was normal at that time, with a creatinine level of 1.1. He is scheduled for repeat blood work I believe to workup his anemia.           Past Medical History:  Diagnosis Date   AICD (automatic cardioverter/defibrillator) present    Balanitis    +severe phimosis+ buried penis->circ not possible but dorsal slit done 10/2019    BPH (benign prostatic hypertrophy)    with urinary retention.  Renal u/s 12/04/19 NORMAL KIDNEYS, NORMAL BLADDER, NO HYDRONEPHROSIS   CAD (coronary artery disease)    Nonobstructive by 12/2012 cath; then 03/2016 he required BMS to RCA ([facility]).  In-stent restenosis on cath 11/01/16, baloon angioplasty successful.   CATARACT, HX OF    Chronic combined systolic and diastolic CHF (congestive heart failure) (HCC) 05/2016   Ischemic CM.  04/2018 EF 40-45%, grd I DD.  07/2021 EF 55-60%, AV well seated   Chronic pain syndrome    Lumbar DDD; chronic neuropathic pain (DM); has spinal stimulator and sees pain mgmt MD   Complicated UTI (urinary tract infection) 09/2019   Phimoses, acute urinary retention, entoerococcus UTI, enterococc bacteremia.  F/u blood clx's neg x 5d.     COPD    Debilitated patient    Diabetes mellitus type 2 with complications (HCC)    HbA1c jumped from 5.7% to 6.1% 03/2015---started metformin  at that time.  DM 2 dx by  fasting gluc criteria 2018.  Has chronic neuropath pain   Enterococcal bacteremia 09/2019   Phimoses, acute urinary retention, entoerococcus UTI, enterococc bacteremia.  F/u blood clx's neg x 5d.     Essential hypertension, benign    Fatty liver 2007   2007 u/s showed fatty liver with hepatosplenomegaly.  2019 repeat u/s->fatty liver but no cirrhosis or hepatosplenomegaly.   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 Fleeta Rothman)   Iron deficiency anemia    09/2022.  Hemoccults x 3 neg Mar 2024.  Improving with oral iron. GI following, no endoscopies   Lumbosacral neuritis    Lumbosacral spondylosis    Lumbar  spinal stenosis with neurogenic claudication--contributes to his chronic pain syndrome   Morbid obesity (HCC)    Normal memory function 08/2014   Neuropsychological testing ([facility]): no cognitive impairment or sign of neurodegenerative disorder.  Likely has adjustment d/o with mixed anxiety/depressed features and may benefit from low dose SNRI.     Normocytic anemia 03/2016   Mild-pt needs ferritin and vit B12 level checked (as of 03/22/16). Hb stable 09/2019.  Hemoccult x 3 NEG 03/2022.  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 chronic?) Off anticoag for a while due to falls.  Then apixaban  started 12/2014.   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 Clorinda): neuropathic pain->diff to treat, failed nucynta , failed spinal stimul trial, oxycontin  hs + tramadol  + gabap as of 12/2017 f/u Dr. Carilyn.   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   Sick sinus syndrome (HCC)    PPM placed    Thrombocytopenia (HCC) 2018   HSM on 2007 abd u/s---suspect some mild splenic sequestration chronically.   Unspecified glaucoma(365.9)    Unspecified hereditary and idiopathic peripheral neuropathy approx age 2   bilat LE's, ? left arm, too.  Feet became progressively numb + left foot pain intermittently.  Pt may be trying a spinal stimulator (as of 05/2015)   Vaccine counseling 11/16/2021   VENOUS INSUFFICIENCY    Being followed by Dr. Harden as of 10/2016  for two R LL venous stasis ulcers/skin tears.  Healed as of 10/30/16 f/u with Dr. Harden.   Venous stasis dermatitis 12/27/2021   VENTRAL HERNIA     Past Surgical History:  Procedure Laterality Date   AMPUTATION Left 04/11/2013   Procedure: AMPUTATION DIGIT Left 3rd toe;  Surgeon: Jerona LULLA Harden, MD;  Location: [facility] OR;  Service: Orthopedics;  Laterality: Left;  Left 3rd toe amputation at [facility]   AMPUTATION Left 08/29/2019   Procedure: LEFT TRANSMETATARSAL AMPUTATION;  Surgeon: Harden Jerona LULLA, MD;  Location: [facility] OR;  Service: Orthopedics;  Laterality: Left;   BIOPSY  05/04/2020   Procedure: BIOPSY;  Surgeon: Charlanne Groom, MD;  Location: [facility] ENDOSCOPY;  Service: Endoscopy;;   CARDIAC CATHETERIZATION  1997; 03/10/16   1997 Non-obstructive disease.  03/2016 BMS to RCA, with 25% pDiag dz, o/w normal cors per cath 03/07/16.  Cath 11/01/16: in stent restenosis, successful baloon angioplasty. 08/2020 branch vessel dz, cont medical therapy rec'd.   CARDIAC CATHETERIZATION  12/24/2012   mild < 20% LCx, prox 30% RCA; LVEF 55-65% , moderate pulmonary HTN, moderate AS   CARDIAC DEFIBRILLATOR PLACEMENT  11/07/2017   Claria MRI Quad CRT defibrillator   CARDIOVASCULAR STRESS TEST  05/11/16 ([facility])   2017 Myocardial perfusion imaging:  No ischemia; scar in apex, global hypokinesis, EF 36%.  06/15/20->mod primarily fixed inferolat wall defect mildly worse with stress c/w infarct/scar with mild peri-infarct ischemia, normal EF-->for cath per cards.   Carotid dopplers   03/09/2016   [facility]: no hemodynamically significant stenosis on either side.   CHOLECYSTECTOMY     COLONOSCOPY [name] 10/30/2012   Procedure: COLONOSCOPY;  Surgeon: Lupita FORBES Commander, MD;  Location: [facility] ENDOSCOPY;  Service: Endoscopy;  Laterality: [name];   CORONARY ANGIOPLASTY WITH STENT PLACEMENT  03/2016; 04/2017   2017-[facility]: BMS to RCA-pt was placed on Brilinta.  04/2017: DES to RCA.   DORSAL SLIT [name] 10/29/2019   for severe phimosis. Procedure: DORSAL SLIT;  Surgeon: Alvaro Hummer, MD;  Location: [facility] ORS;  Service: Urology;  Laterality: [name];  45 MINS   ESOPHAGEAL DILATION  05/04/2020   Procedure: ESOPHAGEAL DILATION;  Surgeon: Charlanne Groom, MD;  Location: [facility] ENDOSCOPY;  Service: Endoscopy;;   ESOPHAGOGASTRODUODENOSCOPY (EGD) WITH PROPOFOL  [name] 05/04/2020   Procedure: ESOPHAGOGASTRODUODENOSCOPY (EGD) WITH PROPOFOL ;  Surgeon: Charlanne Groom, MD;  Location: [facility] ENDOSCOPY;  Service: Endoscopy;  Laterality: [name];   EYE SURGERY Bilateral cataract   HEMORRHOID SURGERY     INTRAOCULAR LENS INSERTION Bilateral    KNEE SURGERY Right    LEFT AND RIGHT HEART CATHETERIZATION WITH CORONARY ANGIOGRAM [name] 12/24/2012   Procedure: LEFT AND RIGHT HEART CATHETERIZATION WITH CORONARY ANGIOGRAM;  Surgeon: Peter M Swaziland, MD;  Location: [facility] CATH LAB;  Service: Cardiovascular;  Laterality: [name];   LEG SURGERY Bilateral    lenghtening    PACEMAKER PLACEMENT  04/13/2016   2nd deg HB after TAVR, pt had DC MDT PPM placed.   SHOULDER ARTHROSCOPY  08/30/2011   Procedure: ARTHROSCOPY SHOULDER;  Surgeon: Jerona LULLA Harden, MD;  Location: [facility] OR;  Service: Orthopedics;  Laterality: Right;  Right Shoulder Arthroscopy, Debridement, and Decompression   SPINAL CORD STIMULATOR INSERTION [name] 09/10/2015   Procedure: LUMBAR SPINAL CORD STIMULATOR INSERTION;  Surgeon: Deward Fabian, MD;  Location: [facility] NEURO ORS;  Service: Neurosurgery;  Laterality: [name];   TEE WITHOUT CARDIOVERSION [name] 09/16/2019   Procedure: TRANSESOPHAGEAL ECHOCARDIOGRAM  (TEE);  Surgeon: Raford Riggs, MD;  Location: [facility] ENDOSCOPY;  Service: Cardiovascular;  Laterality: [name];   TEE WITHOUT CARDIOVERSION  [name] 12/02/2019   +vegetation on AVR and pacer lead in RV.  EF 55-60%, normal wall motion.  Valves function normal.  Procedure: TRANSESOPHAGEAL ECHOCARDIOGRAM (TEE);  Surgeon: Barbaraann Darryle Ned, MD;  Location: [facility] ENDOSCOPY;  Service: Cardiovascular;  Laterality: [name];   TOE AMPUTATION Left    due to osteomyelitis.  R big toe surg due to osteoarth   TONSILLECTOMY     traeculectomy Left    eye   TRANSCATHETER AORTIC VALVE REPLACEMENT, TRANSFEMORAL  04/11/2016   TRANSESOPHAGEAL ECHOCARDIOGRAM  03/09/2016; 09/2019   [facility]: EF 55-60%, PFO seen with bi-directional shunting, no thrombus in appendage.  09/2019 ->no valvular vegetations. Small patent foramen ovale with predominantly left to right shunting across the interatrial septum.   TRANSTHORACIC ECHOCARDIOGRAM  01/2015; 01/2016; 05/18/16; 09/18/16, 05/2017, 08/2017   01/2015 No signif change in aortic stenosis (moderate).  01/2016 Severe LVH w/small LV cavity, EF 60-65%, grade I diast dysfxn.  05/2016 (s/p TAVR): EF 50-55%, grd I DD, biopros AV good.  08/2016--EF 50-55%, LV septal motion c/w conduction abnl, grd I DD,mild MS,bioprosth aortic valve well seated, w/trace AR. 05/2017 TTE EF 35%. 08/2017-EF 35%, mod diff hypokin LV, grd I DD, biopros AV good.    TRANSTHORACIC ECHOCARDIOGRAM  04/2018; 09/2019   04/2018: EF 40-45%, mod diffuse LV hypokin, grd I DD, bioprosth AV well seated, no AS or AR. 09/2019 EF 60-65%, grd I DD, valves fine, including bioprosth AV. 04/29/20 (tech diff) EF 55-60%, grd I DD, vegetation on MV.  07/2021 EF 55-60% (s/p upgrade to BiV PPM), AV well seated.   VITRECTOMY      Family History  Problem Relation Age of Onset   Hypertension Mother    Coronary artery disease Mother    Heart attack Mother    Neuropathy Mother    Pulmonary fibrosis Father        asbestosis      Social History    Socioeconomic History   Marital status: Married    Spouse name: Not on file   Number of children: 0   Years of education: 20   Highest education level: Master's degree (e.g., MA, MS, MEng, MEd, MSW, MBA)  Occupational History   Occupation: Public relations account executive    Comment: retired  Tobacco Use   Smoking status: Never    Passive exposure: Never   Smokeless tobacco: Never  Vaping Use   Vaping status: Never Used  Substance and Sexual Activity   Alcohol  use: No    Alcohol /week: 0.0 standard drinks of alcohol    Drug use: No    Types: Oxycodone    Sexual activity: Not Currently  Other Topics Concern   Not on file  Social History Narrative   HSG, [name] - BS, [name] - MS-engineering, 2 years on PhD - [employer] . Married - '65 - 57yrs/divorced; '76- 3 yrs/divorced; '92 . No children. 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    Lives with wife as of 2020. ACP/Living Will - Yes CPR; long-term Mechanical ventilation as long as he was able to cognate; ok for long term artificial nutrition. Precondition being able to cognate and not to have too much pain.    Social Drivers of Corporate investment banker Strain: Low Risk  (09/23/2023)   Overall Financial Resource Strain (CARDIA)    Difficulty of Paying Living Expenses: Not very hard  Food Insecurity: No Food Insecurity (09/23/2023)   Hunger Vital Sign    Worried About Running Out of Food in the Last Year: Never true  Ran Out of Food in the Last Year: Never true  Transportation Needs: Unmet Transportation Needs (09/23/2023)   PRAPARE - Transportation    Lack of Transportation (Medical): Yes    Lack of Transportation (Non-Medical): Yes  Physical Activity: Unknown (09/23/2023)   Exercise Vital Sign    Days of Exercise per Week: 0 days    Minutes of Exercise per Session: Not on file  Stress: Stress Concern Present (09/23/2023)   [employer] of Occupational Health - Occupational Stress Questionnaire    Feeling of Stress : Very  much  Social Connections: Socially Isolated (09/23/2023)   Social Connection and Isolation Panel    Frequency of Communication with Friends and Family: Once a week    Frequency of Social Gatherings with Friends and Family: Never    Attends Religious Services: Never    Database administrator or Organizations: No    Attends Engineer, structural: Not on file    Marital Status: Married    Allergies  Allergen Reactions   Brimonidine Tartrate Shortness Of Breath    Alphagan-Shortness of breath   Brinzolamide Shortness Of Breath    AZOPT- Shortness of breath   Latanoprost Shortness Of Breath    XALATAN- Shortness of breath   Nucynta  [Tapentadol ] Shortness Of Breath   Sulfa Antibiotics Palpitations   Timolol Maleate Shortness Of Breath and Other (See Comments)    TIMOPTIC- Aggravated asthma   Diltiazem Swelling     leg swelling   Rofecoxib Swelling     VIOXX- leg swelling   Vancomycin Rash and Other (See Comments)    Blisters   Codeine Other (See Comments)    Childhood reaction   Tamsulosin Other (See Comments)    Dizziness    Celecoxib Other (See Comments)    CELLBREX-confusion   Colchicine Diarrhea    diarrhea   Tape Rash     Current Outpatient Medications:    albuterol  (VENTOLIN  HFA) 108 (90 Base) MCG/ACT inhaler, TAKE 2 PUFFS BY MOUTH EVERY 6 HOURS AS NEEDED FOR WHEEZE OR SHORTNESS OF BREATH, Disp: 18 each, Rfl: 0   allopurinol  (ZYLOPRIM ) 300 MG tablet, Take 1 tablet (300 mg total) by mouth daily. with food, Disp: 90 tablet, Rfl: 1   amoxicillin  (AMOXIL ) 500 MG capsule, Take 2 capsules (1,000 mg total) by mouth 2 (two) times daily., Disp: 120 capsule, Rfl: 11   apixaban  (ELIQUIS ) 5 MG TABS tablet, Take 5 mg by mouth 2 (two) times daily., Disp: , Rfl:    ASPIRIN  LOW DOSE 81 MG EC tablet, Take 81 mg by mouth daily., Disp: , Rfl:    B Complex-C (B-COMPLEX WITH VITAMIN C ) tablet, Take 1 tablet by mouth daily. Take 1 tablet daily, 250mg , Disp: , Rfl:    diclofenac   (VOLTAREN ) 75 MG EC tablet, Take 1 tablet (75 mg total) by mouth 2 (two) times daily., Disp: 60 tablet, Rfl: 0   ezetimibe  (ZETIA ) 10 MG tablet, Take 1 tablet (10 mg total) by mouth daily., Disp: 30 tablet, Rfl: 1   ferrous gluconate  (FERGON) 324 MG tablet, Take 1 tablet (324 mg total) by mouth 2 (two) times daily., Disp: 90 tablet, Rfl: 1   finasteride  (PROSCAR ) 5 MG tablet, Take 1 tablet (5 mg total) by mouth daily., Disp: 90 tablet, Rfl: 0   fluticasone  (FLONASE ) 50 MCG/ACT nasal spray, Place 2 sprays into both nostrils daily., Disp: 16 g, Rfl: 6   fluticasone -salmeterol (WIXELA INHUB) 250-50 MCG/ACT AEPB, Inhale 1 puff into the lungs 2 (two) times  daily. in the morning and at bedtime., Disp: 60 each, Rfl: 5   gabapentin  (NEURONTIN ) 800 MG tablet, TAKE 1 TABLET BY MOUTH FOUR TIMES A DAY, Disp: 360 tablet, Rfl: 1   Loratadine (CLARITIN PO), Take 1 tablet by mouth in the morning and at bedtime., Disp: , Rfl:    mometasone -formoterol  (DULERA ) 100-5 MCG/ACT AERO, Inhale 2 puffs into the lungs as needed for wheezing or shortness of breath., Disp: , Rfl:    Multiple Vitamin (MULTIVITAMIN ADULT PO), Take 1 tablet by mouth daily., Disp: , Rfl:    naloxone  (NARCAN ) nasal spray 4 mg/0.1 mL, One spray in nostril as needed for opoid overdose.  May repeat dose in 5 min if needed., Disp: 2 each, Rfl: 1   niacin  (NIASPAN ) 1000 MG CR tablet, TAKE 1 TABLET BY MOUTH TWICE A DAY, Disp: 180 tablet, Rfl: 1   nitroGLYCERIN  (NITROSTAT ) 0.4 MG SL tablet, Place 0.4 mg under the tongue every 5 (five) minutes as needed for chest pain., Disp: , Rfl:    Oxycodone  HCl 20 MG TABS, 1 tab po bid prn severe pain, Disp: 45 tablet, Rfl: 0   pantoprazole  (PROTONIX ) 40 MG tablet, Take 1 tablet (40 mg total) by mouth daily. (Patient taking differently: Take 40 mg by mouth 2 (two) times daily.), Disp: 180 tablet, Rfl: 1   potassium chloride  SA (KLOR-CON  M) 20 MEQ tablet, Take 1 tablet (20 mEq total) by mouth daily., Disp: 90 tablet, Rfl:  1   rosuvastatin  (CRESTOR ) 20 MG tablet, Take 20 mg by mouth., Disp: , Rfl:    torsemide  (DEMADEX ) 20 MG tablet, Take 1 tablet (20 mg total) by mouth 2 (two) times daily., Disp: 180 tablet, Rfl: 1   vitamin C  (ASCORBIC ACID) 250 MG tablet, Take 250 mg by mouth daily., Disp: , Rfl:    VITAMIN D, CHOLECALCIFEROL, PO, Take 1 tablet by mouth daily. , Disp: , Rfl:    Review of Systems  Constitutional:  Negative for activity change, appetite change, chills, diaphoresis, fatigue, fever and unexpected weight change.  HENT:  Negative for congestion, rhinorrhea, sinus pressure, sneezing, sore throat and trouble swallowing.   Eyes:  Negative for photophobia and visual disturbance.  Respiratory:  Negative for cough, chest tightness, shortness of breath, wheezing and stridor.   Cardiovascular:  Negative for chest pain, palpitations and leg swelling.  Gastrointestinal:  Negative for abdominal distention, abdominal pain, anal bleeding, blood in stool, constipation, diarrhea, nausea and vomiting.  Genitourinary:  Negative for difficulty urinating, dysuria, flank pain and hematuria.  Musculoskeletal:  Negative for arthralgias, back pain, gait problem, joint swelling and myalgias.  Skin:  Negative for color change, pallor, rash and wound.  Neurological:  Negative for dizziness, tremors, weakness and light-headedness.  Hematological:  Negative for adenopathy. Does not bruise/bleed easily.  Psychiatric/Behavioral:  Negative for agitation, behavioral problems, confusion, decreased concentration, dysphoric mood and sleep disturbance.        Objective:   Physical Exam Constitutional:      Appearance: He is well-developed.  HENT:     Head: Normocephalic and atraumatic.  Eyes:     Conjunctiva/sclera: Conjunctivae normal.  Cardiovascular:     Rate and Rhythm: Normal rate and regular rhythm.  Pulmonary:     Effort: Pulmonary effort is normal. No respiratory distress.     Breath sounds: No wheezing.   Abdominal:     General: There is no distension.     Palpations: Abdomen is soft.  Musculoskeletal:        General:  No tenderness. Normal range of motion.     Cervical back: Normal range of motion and neck supple.  Skin:    General: Skin is warm and dry.     Coloration: Skin is not pale.     Findings: No erythema or rash.  Neurological:     General: No focal deficit present.     Mental Status: He is alert and oriented to person, place, and time.  Psychiatric:        Mood and Affect: Mood normal.        Behavior: Behavior normal.        Thought Content: Thought content normal.        Judgment: Judgment normal.     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 Current pharmacy issues with refill timing. - Send new prescription for amoxicillin  to pharmacy. - Arrange a 90-day supply with refills to reduce pharmacy visits and give 3 refills --he will stay on this for LIFE.  Chronic venous insufficiency Blisters on leg possibly related to venous stasis changes. No significant edema. Current assessment does not support cellulitis or bone infection.  Chronic thrombocytopenia Chronic thrombocytopenia with low platelet count, consistent with previous findings.  Anemia Mild anemia noted on recent CBC, not significantly worse than previous results. Further workup may be planned by another provider.      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

## 2024-03-17 ENCOUNTER — Ambulatory Visit (INDEPENDENT_AMBULATORY_CARE_PROVIDER_SITE_OTHER): Admitting: Infectious Disease

## 2024-03-17 ENCOUNTER — Other Ambulatory Visit: Payer: Self-pay | Admitting: Family Medicine

## 2024-03-17 ENCOUNTER — Encounter: Payer: Self-pay | Admitting: Infectious Disease

## 2024-03-17 ENCOUNTER — Other Ambulatory Visit: Payer: Self-pay

## 2024-03-17 VITALS — BP 133/72 | HR 88 | Temp 97.9°F

## 2024-03-17 DIAGNOSIS — Z952 Presence of prosthetic heart valve: Secondary | ICD-10-CM | POA: Diagnosis not present

## 2024-03-17 DIAGNOSIS — D696 Thrombocytopenia, unspecified: Secondary | ICD-10-CM | POA: Diagnosis not present

## 2024-03-17 DIAGNOSIS — T827XXD Infection and inflammatory reaction due to other cardiac and vascular devices, implants and grafts, subsequent encounter: Secondary | ICD-10-CM

## 2024-03-17 DIAGNOSIS — B952 Enterococcus as the cause of diseases classified elsewhere: Secondary | ICD-10-CM

## 2024-03-17 DIAGNOSIS — I480 Paroxysmal atrial fibrillation: Secondary | ICD-10-CM

## 2024-03-17 DIAGNOSIS — I330 Acute and subacute infective endocarditis: Secondary | ICD-10-CM

## 2024-03-17 DIAGNOSIS — I872 Venous insufficiency (chronic) (peripheral): Secondary | ICD-10-CM | POA: Diagnosis not present

## 2024-03-17 DIAGNOSIS — M86372 Chronic multifocal osteomyelitis, left ankle and foot: Secondary | ICD-10-CM | POA: Diagnosis not present

## 2024-03-17 DIAGNOSIS — R7881 Bacteremia: Secondary | ICD-10-CM

## 2024-03-17 DIAGNOSIS — I059 Rheumatic mitral valve disease, unspecified: Secondary | ICD-10-CM

## 2024-03-17 DIAGNOSIS — I890 Lymphedema, not elsewhere classified: Secondary | ICD-10-CM

## 2024-03-17 DIAGNOSIS — Z7185 Encounter for immunization safety counseling: Secondary | ICD-10-CM

## 2024-03-17 DIAGNOSIS — I878 Other specified disorders of veins: Secondary | ICD-10-CM

## 2024-03-17 MED ORDER — AMOXICILLIN 500 MG PO CAPS: 1000.0000 mg | ORAL_CAPSULE | Freq: Two times a day (BID) | ORAL | 3 refills | Status: DC

## 2024-03-18 ENCOUNTER — Encounter: Payer: Self-pay | Admitting: Orthopedic Surgery

## 2024-03-18 NOTE — Progress Notes (Signed)
 Office Visit Note   Patient: Tim [name]           Date of Birth: January 10, 1941           MRN: [id number] Visit Date: 03/13/2024              Requested by: [name], MD [address] [place],  [place] [address] PCP: [name], MD  Chief Complaint  Patient presents with   Right Leg - Follow-up      HPI: Discussed the use of AI scribe software for clinical note transcription with the patient, who gave verbal consent to proceed.  History of Present Illness Imer Foxworth is an 83 year old male who presents for wound care follow-up for venous ulcers.  He has been experiencing venous ulcers on both legs. The ulcers have been managed with Vosh and Ace wraps. Recently, his Lasix  was discontinued, which was previously used to manage his brawny edema in both extremities.     Assessment & Plan: Visit Diagnoses:  1. Cellulitis of right lower extremity     Plan: Assessment and Plan Assessment & Plan Chronic bilateral lower extremity venous ulcers Chronic venous ulcers with healthy granulation tissue, no infection, healing well. - Continue Vosh and Ace wraps. - Elevate legs frequently.  Chronic bilateral lower extremity edema Chronic brawny edema in both lower extremities. Lasix  and torsemide  discontinued due to blood work irregularities and potential kidney stress.      Follow-Up Instructions: Return in about 4 weeks (around 04/10/2024).   Ortho Exam  [name], oriented, no adenopathy, well-dressed, normal affect, normal respiratory effort. Physical Exam EXTREMITIES: Brawny edema in both extremities. Venous ulcers on both legs with flat granulation tissue and good bleeding. No cellulitis, odor, or signs of infection.      Imaging: No results found. No images are attached to the encounter.  Labs: Lab Results  Component Value Date   HGBA1C 5.8 (A) 03/06/2024   HGBA1C 5.8 03/06/2024   HGBA1C 5.8 03/06/2024   HGBA1C 5.8  03/06/2024   ESRSEDRATE 14 03/29/2023   ESRSEDRATE 30 (H) 12/15/2021   ESRSEDRATE 34 (H) 11/24/2020   CRP <3.0 03/29/2023   CRP 1.3 11/24/2020   CRP 0.9 07/14/2020   LABURIC 3.5 (L) 09/10/2013   REPTSTATUS 12/15/2021 FINAL 12/14/2021   CULT  12/14/2021    NO GROWTH Performed at [name], [address] [place], [place] [address]    [facility] ENTEROCOCCUS FAECALIS 09/11/2019     Lab Results  Component Value Date   ALBUMIN 3.1 (L) 03/06/2024   ALBUMIN 3.3 (A) 02/12/2024   ALBUMIN 3.3 (A) 01/03/2024    Lab Results  Component Value Date   MG 1.8 03/06/2024   MG 1.7 05/07/2020   MG 1.6 (L) 05/06/2020   No results found for: VD25OH  No results found for: PREALBUMIN    Latest Ref Rng & Units 03/06/2024    3:29 PM 01/03/2024   12:00 AM 06/08/2023    3:30 PM  CBC EXTENDED  WBC 4.0 - 10.5 K/uL 6.0  5.4     5.4   RBC 4.22 - 5.81 Mil/uL 3.68  4.13     4.21   Hemoglobin 13.0 - 17.0 g/dL 87.8  86.6     86.5   HCT 39.0 - 52.0 % 36.2  41     39.7   Platelets 150.0 - 400.0 K/uL 91.0  89     104  NEUT# 1.4 - 7.7 K/uL 4.0  2.70       Lymph# 0.7 - 4.0 K/uL 1.2        This result is from an external source.     There is no height or weight on file to calculate BMI.  Orders:  No orders of the defined types were placed in this encounter.  No orders of the defined types were placed in this encounter.    Procedures: No procedures performed  Clinical Data: No additional findings.  ROS:  All other systems negative, except as noted in the HPI. Review of Systems  Objective: Vital Signs: There were no vitals taken for this visit.  Specialty Comments:  No specialty comments available.  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) 12/14/2021   Prediabetes 12/14/2021   Thrombocytopenia (HCC) 12/14/2021   Obesity, Class III, BMI 40-49.9 (morbid obesity) 12/14/2021   BPH (benign prostatic hyperplasia) 12/14/2021   Vaccine counseling 11/16/2021   Weakness 02/23/2021   Physical deconditioning 02/23/2021   Rhinitis 07/14/2020   Mitral valve vegetation 05/28/2020   Non-pressure chronic ulcer of other part of left foot limited to breakdown of skin (HCC)    Esophageal dysphagia    Bacteremia    Endocarditis of mitral valve    Fever 04/28/2020   Cervical lymphadenopathy 03/15/2020   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   ICD (implantable cardioverter-defibrillator) infection (HCC) 12/22/2019   Gram-positive bacteremia 11/27/2019   FUO (fever of unknown origin) 11/26/2019   Low blood pressure reading 11/05/2019   Wound dehiscence 10/22/2019   PFO (patent foramen ovale) 10/06/2019   ICD (implantable cardioverter-defibrillator) battery depletion    Pressure injury of skin 09/13/2019   Enterococcal bacteremia history  09/13/2019   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   Urinary retention 09/13/2019   Phimosis 09/13/2019   SIRS (systemic inflammatory response syndrome) (HCC) 09/12/2019   Sepsis (HCC) 09/12/2019   Enterococcus UTI 09/12/2019   Cellulitis of foot    History of transmetatarsal amputation of left foot (HCC)    Subacute osteomyelitis, left ankle and foot (HCC)    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) 04/17/2018   Diabetic neuropathy, painful (HCC) 12/26/2016   Neuropathic pain of foot, left 06/20/2016   Insulin  resistance 07/22/2015   Status post amputation of left great toe (HCC) 06/08/2015   Spinal stenosis of lumbar region 02/16/2015   Hereditary and idiopathic peripheral neuropathy 01/12/2015   Oral thrush  12/28/2014   Orthostatic dizziness 05/24/2014   Paresthesias with subjective weakness 01/30/2014   Bilateral thigh pain 01/30/2014   Abdominal wall pain in right upper quadrant 01/22/2014   Gross hematuria 09/25/2013   Intertrigo 09/25/2013   IBS (irritable bowel syndrome) 09/11/2013   Chronic pain syndrome 08/06/2013   CAD (coronary artery disease), native coronary artery 12/25/2012   Paroxysmal atrial fibrillation (HCC) 12/25/2012   Moderate aortic stenosis 12/25/2012   Aortic stenosis 12/24/2012   History of colonic polyps 10/30/2012   Diverticulosis of colon 10/30/2012   Internal hemorrhoids 10/30/2012   Change in bowel habits intermittent loose stools 10/30/2012   Routine health maintenance 06/09/2012   Hereditary peripheral neuropathy 10/10/2011  Long term current use of anticoagulant - apixiban 08/16/2010   Dyspnea on exertion 09/08/2009   HYPERCHOLESTEROLEMIA-PURE 01/13/2009   Essential hypertension, benign 01/13/2009   EDEMA 01/13/2009   GOUT 01/12/2009   GLAUCOMA 01/12/2009   Venous insufficiency (chronic) (peripheral) 01/12/2009   COPD (chronic obstructive pulmonary disease) (HCC) 01/12/2009   VENTRAL HERNIA 01/12/2009   OSTEOARTHRITIS 01/12/2009   ARTHRITIS 01/12/2009   DEGENERATIVE DISC DISEASE 01/12/2009   CATARACT, HX OF 01/12/2009   HEART MURMUR, HX OF 01/12/2009   BENIGN PROSTATIC HYPERTROPHY, HX OF 01/12/2009   Obesity 01/17/2007   OBSTRUCTIVE SLEEP APNEA 01/17/2007   ASTHMA 01/17/2007   GERD 01/17/2007   HALLUX VALGUS, ACQUIRED 01/17/2007   Pulmonary hypertension (HCC) 01/17/2007   Past Medical History:  Diagnosis Date   AICD (automatic cardioverter/defibrillator) present    Balanitis    +severe phimosis+ buried penis->circ not possible but dorsal slit done 10/2019   BPH (benign prostatic hypertrophy)    with urinary retention.  Renal u/s 12/04/19 NORMAL KIDNEYS, NORMAL BLADDER, NO HYDRONEPHROSIS   CAD (coronary artery disease)    Nonobstructive by  12/2012 cath; then 03/2016 he required BMS to RCA ([facility]).  In-stent restenosis on cath 11/01/16, baloon angioplasty successful.   CATARACT, HX OF    Chronic combined systolic and diastolic CHF (congestive heart failure) (HCC) 05/2016   Ischemic CM.  04/2018 EF 40-45%, grd I DD.  07/2021 EF 55-60%, AV well seated   Chronic pain syndrome    Lumbar DDD; chronic neuropathic pain (DM); has spinal stimulator and sees pain mgmt MD   Complicated UTI (urinary tract infection) 09/2019   Phimoses, acute urinary retention, entoerococcus UTI, enterococc bacteremia.  F/u blood clx's neg x 5d.     COPD    Debilitated patient    Diabetes mellitus type 2 with complications (HCC)    HbA1c jumped from 5.7% to 6.1% 03/2015---started metformin  at that time.  DM 2 dx by fasting gluc criteria 2018.  Has chronic neuropath pain   Enterococcal bacteremia 09/2019   Phimoses, acute urinary retention, entoerococcus UTI, enterococc bacteremia.  F/u blood clx's neg x 5d.     Essential hypertension, benign    Fatty liver 2007   2007 u/s showed fatty liver with hepatosplenomegaly.  2019 repeat u/s->fatty liver but no cirrhosis or hepatosplenomegaly.   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 Fleeta Rothman)   Iron deficiency anemia    09/2022.  Hemoccults x 3 neg Mar 2024.  Improving with oral iron. GI following, no endoscopies   Lumbosacral neuritis    Lumbosacral spondylosis    Lumbar spinal stenosis with neurogenic claudication--contributes to his chronic pain syndrome   Morbid obesity (HCC)    Normal memory function 08/2014   Neuropsychological testing ([facility]): no cognitive impairment or sign of neurodegenerative disorder.  Likely has adjustment d/o with mixed anxiety/depressed features and may benefit from low dose SNRI.     Normocytic anemia 03/2016   Mild-pt needs ferritin and vit B12 level checked (as of 03/22/16). Hb stable 09/2019.  Hemoccult x 3 NEG 03/2022.  Hemoccults NEG x 03 Sep 2022   NSTEMI (  non-ST elevated myocardial infarction) (HCC) 03/20/2016   BMS to RCA   Obesity hypoventilation syndrome (HCC)    Orthostatic hypotension    OSA on CPAP    8 cm H2O   OSTEOARTHRITIS    Osteomyelitis of left foot (HCC) 05/03/2023   Paroxysmal atrial fibrillation (HCC) 2003    (? chronic?) Off anticoag for a while due to falls.  Then apixaban  started 12/2014.   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 Clorinda): neuropathic pain->diff to treat, failed nucynta , failed spinal stimul trial, oxycontin  hs + tramadol  + gabap as of 12/2017 f/u Dr. Carilyn.   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   Sick sinus syndrome (HCC)    PPM placed   Thrombocytopenia (HCC) 2018   HSM on 2007 abd u/s---suspect some mild splenic sequestration chronically.   Unspecified glaucoma(365.9)    Unspecified hereditary and idiopathic peripheral neuropathy  approx age 1   bilat LE's, ? left arm, too.  Feet became progressively numb + left foot pain intermittently.  Pt may be trying a spinal stimulator (as of 05/2015)   Vaccine counseling 11/16/2021   VENOUS INSUFFICIENCY    Being followed by Dr. Harden as of 10/2016 for two R LL venous stasis ulcers/skin tears.  Healed as of 10/30/16 f/u with Dr. Harden.   Venous stasis dermatitis 12/27/2021   VENTRAL HERNIA     Family History  Problem Relation Age of Onset   Hypertension Mother    Coronary artery disease Mother    Heart attack Mother    Neuropathy Mother    Pulmonary fibrosis Father        asbestosis    Past Surgical History:  Procedure Laterality Date   AMPUTATION Left 04/11/2013   Procedure: AMPUTATION DIGIT Left 3rd toe;  Surgeon: Jerona LULLA Harden, MD;  Location: [facility] OR;  Service: Orthopedics;  Laterality: Left;  Left 3rd toe amputation at [facility]   AMPUTATION Left 08/29/2019   Procedure: LEFT TRANSMETATARSAL AMPUTATION;  Surgeon: Harden Jerona LULLA, MD;  Location: [name] OR;  Service: Orthopedics;  Laterality: Left;   BIOPSY  05/04/2020   Procedure: BIOPSY;  Surgeon: Charlanne Groom, MD;  Location: [facility] ENDOSCOPY;  Service: Endoscopy;;   CARDIAC CATHETERIZATION  1997; 03/10/16   1997 Non-obstructive disease.  03/2016 BMS to RCA, with 25% pDiag dz, o/w normal cors per cath 03/07/16.  Cath 11/01/16: in stent restenosis, successful baloon angioplasty. 08/2020 branch vessel dz, cont medical therapy rec'd.   CARDIAC CATHETERIZATION  12/24/2012   mild < 20% LCx, prox 30% RCA; LVEF 55-65% , moderate pulmonary HTN, moderate AS   CARDIAC DEFIBRILLATOR PLACEMENT  11/07/2017   Claria MRI Quad CRT defibrillator   CARDIOVASCULAR STRESS TEST  05/11/16 ([facility])   2017 Myocardial perfusion imaging:  No ischemia; scar in  apex, global hypokinesis, EF 36%.  06/15/20->mod primarily fixed inferolat wall defect mildly worse with stress c/w infarct/scar with mild peri-infarct ischemia, normal EF-->for cath per cards.   Carotid dopplers   03/09/2016   [facility]: no hemodynamically significant stenosis on either side.   CHOLECYSTECTOMY     COLONOSCOPY [name] 10/30/2012   Procedure: COLONOSCOPY;  Surgeon: Lupita FORBES Commander, MD;  Location: [facility] ENDOSCOPY;  Service: Endoscopy;  Laterality: [name];   CORONARY ANGIOPLASTY WITH STENT PLACEMENT  03/2016; 04/2017   2017-[facility]: BMS to RCA-pt was placed on Brilinta.  04/2017: DES to RCA.   DORSAL SLIT [name] 10/29/2019   for severe phimosis. Procedure: DORSAL SLIT;  Surgeon: Alvaro Hummer, MD;  Location: [facility] ORS;  Service: Urology;  Laterality: [name];  45 MINS   ESOPHAGEAL DILATION  05/04/2020   Procedure: ESOPHAGEAL DILATION;  Surgeon: Charlanne Groom, MD;  Location[facility] END[facility]COPY;  Service: Endoscopy;;   ESOPHAGOGASTRODUODENOSCOPY (EGD) WITH PROPOFOL  [name] 05/04/2020   Procedure: ESOPHAGOGASTRODUODENOSCOPY (EGD) WITH PROPOFOL ;  Surgeon: Charlanne Groom, MD;  Location: [facility] ENDOSCOPY;  Service: Endoscopy;  Laterality: [name];   EYE SURGERY Bilateral cataract   HEMORRHOID SURGERY     INTRAOCULAR LENS INSERTION Bilateral    KNEE SURGERY Right    LEFT AND RIGHT HEART CATHETERIZATION WITH CORONARY ANGIOGRAM [name] 12/24/2012   Procedure: LEFT AND RIGHT HEART CATHETERIZATION WITH CORONARY ANGIOGRAM;  Surgeon: Peter M Swaziland, MD;  Location[facility] CATH LAB;  Service: Cardiovascular;  Laterality: [name];   LEG SURGERY Bilateral    lenghtening    PACEMAKER PLACEMENT  04/13/2016   2nd deg HB after TAVR, pt had DC MDT PPM placed.   SHOULDER ARTHROSCOPY  08/30/2011   Procedure: ARTHROSCOPY SHOULDER;  Surgeon: Jerona LULLA Sage, MD;  Location: [facility] OR;  Service: Orthopedics;  Laterality: Right;  Right Shoulder Arthroscopy, Debridement, and Decompression   SPINAL CORD STIMULATOR INSERTION [name] 09/10/2015   Procedure: LUMBAR SPINAL CORD STIMULATOR INSERTION;  Surgeon: Deward Fabian, MD;  Location: [facility] NEURO ORS;  Service: Neurosurgery;  Laterality: [name];   TEE WITHOUT CARDIOVERSION [name] 09/16/2019   Procedure: TRANSESOPHAGEAL ECHOCARDIOGRAM  (TEE);  Surgeon: Raford Riggs, MD;  Location: [facility] ENDOSCOPY;  Service: Cardiovascular;  Laterality: [name];   TEE WITHOUT CARDIOVERSION [name] 12/02/2019   +vegetation on AVR and pacer lead in RV.  EF 55-60%, normal wall motion.  Valves function normal.  Procedure: TRANSESOPHAGEAL ECHOCARDIOGRAM (TEE);  Surgeon: Barbaraann Darryle Ned, MD;  Location: [facility] ENDOSCOPY;  Service: Cardiovascular;  Laterality: [name];   TOE AMPUTATION Left    due to osteomyelitis.  R big toe surg due to osteoarth   TONSILLECTOMY     traeculectomy Left    eye   TRANSCATHETER AORTIC VALVE REPLACEMENT, TRANSFEMORAL  04/11/2016   TRANSESOPHAGEAL ECHOCARDIOGRAM  03/09/2016; 09/2019   [facility]: EF 55-60%, PFO seen with bi-directional shunting, no thrombus in appendage.  09/2019 ->no valvular vegetations. Small patent foramen ovale with predominantly left to right shunting across the interatrial septum.   TRANSTHORACIC ECHOCARDIOGRAM  01/2015; 01/2016; 05/18/16; 09/18/16, 05/2017, 08/2017   01/2015 No signif change in aortic stenosis (moderate).  01/2016 Severe LVH w/small LV cavity, EF 60-65%, grade I diast dysfxn.  05/2016 (s/p TAVR): EF 50-55%, grd I DD, biopros AV good.  08/2016--EF 50-55%, LV septal motion c/w conduction abnl, grd I DD,mild MS,bioprosth aortic valve well seated, w/trace AR. 05/2017 TTE EF 35%. 08/2017-EF 35%, mod diff hypokin LV, grd I DD, biopros AV good.    TRANSTHORACIC ECHOCARDIOGRAM  04/2018; 09/2019   04/2018: EF 40-45%, mod diffuse LV hypokin,  grd I DD, bioprosth AV well seated, no AS or AR. 09/2019 EF 60-65%, grd I DD, valves fine, including bioprosth AV. 04/29/20 (tech diff) EF 55-60%, grd I DD, vegetation on MV.  07/2021 EF 55-60% (s/p upgrade to BiV PPM), AV well seated.   VITRECTOMY     Social History   Occupational History   Occupation: Public relations account executive    Comment: retired  Tobacco Use   Smoking status: Never    Passive exposure: Never   Smokeless tobacco: Never  Vaping Use   Vaping status: Never Used   Substance and Sexual Activity   Alcohol  use: No    Alcohol /week: 0.0 standard drinks of alcohol    Drug use: No    Types: Oxycodone    Sexual activity: Not Currently

## 2024-03-19 ENCOUNTER — Other Ambulatory Visit (INDEPENDENT_AMBULATORY_CARE_PROVIDER_SITE_OTHER)

## 2024-03-19 DIAGNOSIS — R7989 Other specified abnormal findings of blood chemistry: Secondary | ICD-10-CM | POA: Diagnosis not present

## 2024-03-19 LAB — BASIC METABOLIC PANEL WITH GFR
BUN: 12 mg/dL (ref 6–23)
CO2: 26 meq/L (ref 19–32)
Calcium: 8.5 mg/dL (ref 8.4–10.5)
Chloride: 106 meq/L (ref 96–112)
Creatinine, Ser: 0.71 mg/dL (ref 0.40–1.50)
GFR: 84.37 mL/min (ref >60.00)
Glucose, Bld: 123 mg/dL — ABNORMAL HIGH (ref 70–99)
Potassium: 4.4 meq/L (ref 3.5–5.1)
Sodium: 139 meq/L (ref 135–145)

## 2024-03-20 ENCOUNTER — Other Ambulatory Visit: Payer: Self-pay | Admitting: Family Medicine

## 2024-03-20 ENCOUNTER — Ambulatory Visit: Payer: Self-pay | Admitting: Family Medicine

## 2024-03-20 NOTE — Telephone Encounter (Signed)
 Copied from CRM [id number]. Topic: Clinical - Medication Refill >> Mar 20, 2024  3:05 PM [facility] ORN wrote: Medication: Oxycodone  HCl 20 MG TABS  Has the patient contacted their pharmacy? No (Agent: If no, request that the patient contact the pharmacy for the refill. If patient does not wish to contact the pharmacy document the reason why and proceed with request.) (Agent: If yes, when and what did the pharmacy advise?)  This is the patient's preferred pharmacy:  [facility] [phone] - [place], [place] - [address] [place] [address] Phone: [phone] Fax: [phone]  Is this the correct pharmacy for this prescription? Yes If no, delete pharmacy and type the correct one.   Has the prescription been filled recently? No  Is the patient out of the medication? No  Has the patient been seen for an appointment in the last year OR does the patient have an upcoming appointment? Yes  Can we respond through MyChart? No  Agent: Please be advised that Rx refills may take up to 3 business days. We ask that you follow-up with your pharmacy.

## 2024-03-21 MED ORDER — OXYCODONE HCL 20 MG PO TABS: ORAL_TABLET | ORAL | 0 refills | Status: DC

## 2024-03-25 LAB — VITAMIN B1: Vitamin B1 (Thiamine): 17 nmol/L (ref 8–30)

## 2024-03-28 NOTE — Discharge Summary (Addendum)
 [employer] [facility]  [facility] Inpatient Discharge Summary  PCP: Aleene VEAR Hockey, MD Discharge Details   Admit date:         03/21/2024 Discharge date:        03/28/2024  Hospital Days:    7 days  Code Status:   Full Code Advanced Directives on file: Living Will Teacher, music of Desire for Natural Death), Healthcare Power of Attorney     Living Will (Declaration of Desire for Natural Death): No, copy requested from patient  Discharge Diagnoses:  Principal Problem:   Chest pain Active Problems:   Chronic obstructive pulmonary disease (*)   Atrial fibrillation (*)   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 Follow-Up Appointments Already Scheduled: No future appointments.  Discharge Medications:   Current Discharge Medication List     START taking these medications      Details  amLODIPine besylate 5 mg tablet Commonly known as: NORVASC  Take one tablet (5 mg dose) by mouth 2 (two) times daily. Quantity: 60 tablet   carvedilol 3.125 mg tablet Commonly known as: COREG  Take one tablet (3.125 mg dose) by mouth 2 (two) times daily. Quantity: 60 tablet   clotrimazole -betamethasone  cream Commonly known as: LOTRISONE   Apply topically 2 (two) times daily. Quantity: 45 g   losartan  potassium 25 mg tablet Commonly known as: COZAAR  Start taking on: March 29, 2024  Take one tablet (25 mg dose) by mouth daily. Indication: High Blood Pressure Quantity: 30 tablet   ranolazine 500 mg 12 hr tablet Commonly known as: RANEXA  Take one tablet  (500 mg dose) by mouth 2 (two) times daily. Quantity: 60 tablet       CONTINUE these medications which have NOT CHANGED      Details  albuterol  sulfate HFA 108 (90 Base) MCG/ACT inhaler Commonly known as: PROVENTIL ,VENTOLIN ,PROAIR   INHALE 2 PUFFS BY MOUTH EVERY 6 HOURS AS NEEDED FOR WHEEZE Quantity: 8 g   allopurinol  300 mg tablet Commonly known as: ZYLOPRIM   Take one tablet (300 mg dose) by mouth every morning.   amoxicillin  500 MG tablet Commonly known as: AMOXIL   Take two tablets (1,000 mg dose) by mouth 2 (two) times daily.   apixaban  5 mg tablet Commonly known as: ELIQUIS   Take one tablet (5 mg dose) by mouth 2 (two) times daily. Indication: Atrial Fibrillation Quantity: 180 tablet   ASPIRIN  LOW DOSE EC tablet Generic drug: aspirin   TAKE 1 TABLET BY MOUTH DAILY. Quantity: 90 tablet   B-complex with vitamin C  tablet  Take one tablet by mouth every evening.   cholecalciferol 1,000 units (25 mcg) tablet Commonly known as: VITAMIN D-3  Take one tablet (1,000 Units dose) by mouth every evening.   DULERA  100-5 MCG/ACT Aero inhaler Generic drug: mometasone  furoate & formoterol  fumarate  Inhale two puffs into the lungs 2 (two) times a day as needed.   ezetimibe  10 MG tablet Commonly known as: ZETIA   TAKE ONE TABLET (10 MG DOSE) BY MOUTH DAILY. LABS REQUIRED FOR FUTURE REFILLS Quantity: 90 tablet   ferrous gluconate  324 mg tablet Commonly known as: FERGON  Take one tablet (  324 mg dose) by mouth 2 (two) times daily. Quantity: 60 tablet   finasteride  5 mg tablet Commonly known as: PROSCAR   Take one tablet (5 mg dose) by mouth every morning.   fluticasone  propionate 50 mcg/actuation nasal spray Commonly known as: FLONASE   two sprays by Both Nostrils route as needed.   gabapentin  800 mg tablet Commonly known as: NEURONTIN   Take one tablet (800 mg dose) by mouth 4 (four) times daily.   loratadine 10 MG tablet Commonly known as: CLARITIN  Take one tablet (10  mg dose) by mouth 2 (two) times daily.   niacin  1,000 mg CR tablet Commonly known as: NIASPAN   Take one tablet (1,000 mg dose) by mouth 2 (two) times daily.   nitroGLYCERIN  0.4 mg SL tablet Commonly known as: NITROSTAT   TAKE 1 TABLET BY MOUTH EVERY 5 MINUTES AS NEEDED FOR CHEST PAIN Quantity: 75 tablet   ondansetron  4 mg disintegrating tablet Commonly known as: ZOFRAN -ODT  Take one tablet (4 mg dose) by mouth every 6 (six) hours as needed for Nausea. Quantity: 20 tablet   oxyCODONE  HCl 20 mg 12 hr tablet Commonly known as: OXYCONTIN   Take one tablet (20 mg dose) by mouth every 12 (twelve) hours as needed for Pain.   pantoprazole  sodium 40 mg tablet Commonly known as: PROTONIX   TAKE ONE TABLET BY MOUTH 2 TIMES DAILY. Quantity: 180 tablet   promethazine  25 MG tablet Commonly known as: PHENERGAN   Take one tablet (25 mg dose) by mouth every 6 (six) hours as needed for Nausea. Quantity: 20 tablet   rosuvastatin  calcium  20 mg tablet Commonly known as: CRESTOR   Take one tablet (20 mg dose) by mouth at bedtime. Quantity: 90 tablet   THERA Tabs  Take one tablet by mouth every morning.   torsemide  20 mg tablet Commonly known as: DEMADEX   Take one tablet (20 mg dose) by mouth as needed.   WIXELA INHUB 250-50 mcg/dose Aepb inhalation powder Generic drug: fluticasone -salmeterol  Inhale one puff into the lungs 2 (two) times daily.      * You might also be taking other medications not listed above. If you have questions about any of your other medications, talk to the person who prescribed them or your Primary Care Provider.          STOP taking these medications    potassium chloride  10 mEq CR tablet Commonly known as: KLOR-CON    potassium chloride  20 mEq CR tablet Commonly known as: K-DUR,KLOR-CON    potassium chloride  20 mEq packet Commonly known as: KLOR-CON        ASK your doctor about these medications      Details  Respiratory Therapy Supplies Misc   Inhale into the lungs at bedtime. CPAP        Allergies: Allergies[1]  Consultations this Admission: IP CONSULT TO HOSPITALIST INPATIENT CONSULT TO WOUND OSTOMY IP CONSULT TO NUTRITION SERVICES IP CONSULT TO IV TEAM IP CONSULT TO CASE MANAGEMENT, RN/SW  Procedures/Imaging: Procedure(s) (LRB): Heart Cath-Left Diagnostic ([name])    Cardiac Catheterization  Final Result    Echocardiogram Complete W Enhancing Agent  Final Result  Left Ventricle: EF: 55-60%. Wall motion is normal.  .  Aortic Valve: There is a TAVR bioprosthetic valve. The peak velocity   across the valve is 3.4 m/s.  SABRA  Peak velocities across the TAVR valve have increased slightly since   03/2023. Otherwise no significant change.      CT Angio Chest Pulmonary  Final Result  IMPRESSION:  CT CHEST  1.  Study limited by motion artifact.  No acute pulmonary embolus or thoracic aortic dissection identified.  2.  Nonspecific bilateral upper lobe groundglass densities which may represent air trapping, pneumonitis, or atypical pulmonary edema.  3.  Small bilateral pleural effusions.  4.  Pacemaker.  5.  Prominent coronary artery calcifications.              Electronically Signed by: Charlie Patch, MD on 03/21/2024 4:37 PM    XR Chest Ap Portable  Final Result  IMPRESSION:    1.  No evidence of acute cardiopulmonary abnormality.  2.  Mild cardiomegaly.      Electronically Signed by: Christopher Nguyen on 03/21/2024 2:20 PM      Pertinent Labs:  Cardiac Labs: No results for input(s): CK, CKMB, CTNI, BNP in the last 168 hours. CBC: Recent Labs    Units 03/27/24 0449 03/26/24 0124 03/25/24 0113 03/24/24 0044 03/23/24 0458 03/22/24 0450  WBC thou/mcL 4.1 5.9 4.9 4.6 4.3 4.6  HGB gm/dL 88.0* 89.8* 9.3* 89.7* 9.7* 9.8*  PLT thou/mcL 62* 77* 71* 71* 68* 65*   BMP: Recent Labs    Units 03/27/24 0449 03/26/24 0124 03/25/24 0113 03/24/24 0044 03/23/24 0458 03/22/24 0450  NA mmol/L  138 138 137 137 138 139  K mmol/L 4.3 4.5 4.0 3.8 4.0 3.9  CL mmol/L 104 104 103 101 103 106  CO2 mmol/L 25 23 22 22 24 24   BUN mg/dL 12 12 12 13 11 13   CREATININE mg/dL 9.29* 9.38* 9.24* 9.30* 0.70* 0.70*  MAGNESIUM  mg/dL  --   --   --  1.7 1.8 1.4*   Lipid Panel: No results for input(s): CHOL, TRIG, HDL, LDL in the last 168 hours. Liver Enzymes: Recent Labs    Units 03/24/24 0044 03/23/24 0458 03/22/24 0450  AST U/L 80* 56* 58*  ALT U/L 45 32 33  ALKPHOS U/L 117 104 109  BILITOT mg/dL 0.5 0.5 0.6   Endocrine Panels: Recent Labs    Units 03/27/24 0449 03/26/24 0124 03/25/24 0113 03/24/24 0044 03/23/24 0458 03/22/24 0450 03/21/24 2038  HGBA1C %  --   --   --   --   --  5.4  --   GLUCOSE mg/dL 880* 873* 882* 876* 881* 119* [name] Course   Physicians involved in care during this hospitalization Attending Provider: Oneil Debby Shanks, MD Attending Provider: Gerilyn DELENA Sales, MD Attending Provider: Sallyann SHAUNNA Going, MD Attending Provider: Norleen Gladis Colbert Mickey. Attending Provider: Lani CHRISTELLA Headland, MD Attending Provider: Rosaline CHRISTELLA Prescott, DO Admitting Provider: Willim Fears, MD Consulting Physician: Jayson JINNY Bihari, MD Consulting Physician: [employer] [facility] [employer] Consulting Physician: Willim Fears, MD Consulting Physician: Lani CHRISTELLA Headland, MD Consulting Physician: Jayson JINNY Bihari, MD  HPI per admitting provider:   Ms. Saputo is a 83 year old male with a known history of coronary artery disease, who presents for evaluation of chest pain concerning for unstable angina. He has had multiple interventions to his right coronary artery in 2017 and 2018, aortic stenosis status post-transcatheter aortic valve replacement in 2017, and bioprosthetic endocarditis of the aortic valve, which was medically managed. Additionally, he had a pacemaker placed in 2017, which was upgraded to a biventricular implantable  cardioverter-defibrillator in September 2019 due to nonischemic cardiomyopathy. His most EF is 55%. His other medical history includes venous ulcerations for which he is following with wound care, AMP sensitive enterococcal UTI complicated by enterococcal bacteremia in March 2021 in  the setting of his AICD and TAVR with TEE showing prosthetic aortic valve endocarditis and ICD infection currently on Amoxicillin  BID, chronic thrombocytopenia, anemia, obesity, paroxysmal A.Fib on Eliquis , pulmonary HTN, COPD, CHF, asthma, and arthritis.   The patient reports waking in the middle of the night with a dull, intense pain in the center of his chest, which radiated potentially to the left shoulder blade. The pain reached a severity of 9/10 at its worst. He took three sublingual nitroglycerin  tablets, five minutes apart, which relieved the pain. He went back to sleep but was subsequently awoken again by recurrent pain, accompanied by mild shortness of breath. Concerned by the recurrence, he called 911. He denies palpitations, presyncope, or syncope. He has also noted increased swelling in his lower extremities but denies orthopnea or paroxysmal nocturnal dyspnea. He is unable to determine if the pain worsens with exertion, as he spends most of his time in a recliner due to significant mobility issues, which also make it difficult for him to leave the house for appointments. Upon presentation to the emergency department his troponin level is 53, N-terminal pro B-type natriuretic peptide (NT-proBNP) is elevated at 1000. His EKG shows a V-paced rhythm. He will be admitted for chest pain with cardiology consultation.     Hospital Course:    Chest Pain (Unstable Angina) - improving since starting ranexa - Extensive cardiac history as noted in the HPI Bare-metal stent (BMS) to right coronary artery (RCA) in 2017. 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 Drug-eluting stent (DES) in RCA for ISR in 2018. LHC during this admission: CONCLUSIONS:  Successful transradial cardiac catheterization Branch vessel obstructive coronary disease in a diagonal   RECOMMENDATIONS: The patient's ostial diagonal disease has progressed and I believe this is the cause for his non-ST segment elevation myocardial infarction.  This is a small branch vessel and not amenable to PCI given the fact that it would jeopardize the LAD and the diameter of the diagonal is likely too small for stenting.  Recommend medical therapy with aspirin , Eliquis , statin.  Recommend the addition of Ranexa.   BiVICD 2019  AS s/p TAVR 2017 AV bioprosthetic endocarditis which was treated medically - he remains on lifelong amoxicillin  prophylaxis - cont Eliquis , aspirin , statin -  Ranexa added per Cardiology recs, he has room to increase ranexa dose if cp returns - nitro prn --- LHC--> Branch vessel obstructive coronary disease in a diagonal cardiology recommended medical management and added Ranexa.    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 per his ID dr note, continued   #Chronic Thrombocytopenia - Daily CBC - Continue to trend platelet counts - If platelet count below 50k then consider holding anticoagulation    #COPD #Asthma - Resumed Home Inhalers - IS - RT    #Chronic Lower Extremity Wounds due to Venous stasis - Wound care consult    #HLD - Continue home rosuvastatin  20 mg daily  - Continue home Zetia  10 mg daily    #Chronic Back Pain - Continue home oxycodone  20 mg q12 hours - Continue home Gabapentin  QID   #Gout - Continue home allopurinol     Weakness: - acute on chronic - PT AMPAC 10, SNF recommended but patient decided he wanted to go home and declined HH.  He is DC to home  with his wife.   He reports he has all necessary equipment.  BP (!) 168/74 (BP Location: Left Upper Arm, Patient Position: Lying)   Pulse 64   Temp 97.5 F (36.4 C) (Oral)   Resp 18   Ht 1.93 m (6' 4)   Wt 125.2 kg (276 lb 0.3 oz)   SpO2 95%   BMI 33.60 kg/m   Physical Exam Constitutional - resting comfortably, cooperative, obese Eyes - extraocular movements intact       CV - (+)S1S2, ++ BL LE Resp - diminished, normal effort on RA GI - (+)BS, soft, non-tender, non-distended MSK - moving extremities spontaneously Skin - chronic BL LE skin changes/multiple ecchymosis on extremities Neuro - no focal deficits Psych - normal affect and mood axo   Post Hospital Care   Activity:   Weight Bearing Status:          Oxygen Orders for Discharge: O2 Device: None (Room air) SpO2: 95 %  Diet: Diet and Nourishment Orders (From admission, onward)     Start       03/24/24 1419  Cardiac diet No Caffeine  Diet effective now       Question:  Additional restrictions:  Answer:  No Caffeine              Wound Care Recommendations: As below Wound Orders (From admission, onward)     Start       03/23/24 0600  WOUND CARE     Comments: To the bilateral lower extremities apply Coloplast moisture barrier ointment, then apply a vaseline gauze to the fragile/ruptured blistered areas, then wrap with kerlix from just below the toes to the knee, change daily.  Order Comments: To the bilateral lower extremities apply Coloplast moisture barrier ointment, then apply a vaseline gauze to the fragile/ruptured blistered areas, then wrap with kerlix from just below the toes to the knee, change daily.        03/22/24 2000  WOUND CARE     Comments: To the abdominal fold, groin areas, medial thighs and buttocks clean with coloplast skin cleanser then pat skin dry and apply lotrisone  cream then apply Baza clear ointment BID  Order Comments: To the abdominal fold, groin areas, medial thighs and buttocks clean  with coloplast skin cleanser then pat skin dry and apply lotrisone  cream then apply Baza clear ointment BID        03/22/24 1508  WOUND CARE     Comments: To the bilateral heels apply Coloplast moisture barrier ointment daily and prn.  Keep heels floated over pillows.  Order Comments: To the bilateral heels apply Coloplast moisture barrier ointment daily and prn.  Keep heels floated over pillows.        03/21/24 2232  OPEN BLISTER(S)     Comments: Location: BLE, Clean area with sodium chloride  0.9%.  Apply Vaseline gauze.  Cover with dry dressing.  Wrap/secure as needed.  Change dressing daily and as needed.  Consult Wound Ostomy Services [name]) for treatment regimen as indicated / needed.  Order Comments: Location: BLE, Clean area with sodium chloride  0.9%.  Apply Vaseline gauze.  Cover with dry dressing.  Wrap/secure as needed.  Change dressing daily and as needed.  Consult Wound Ostomy Services [facility]) for treatment regimen as indicated / needed.     Question:  Screening order?  Answer:  Yes              Lines/Drains/Airways: Patient Lines/Drains/Airways Status     Active LDAs  Name Placement date Placement time Site Days   Peripheral IV 20 G Right Antecubital 03/21/24  1246  Antecubital  7            Therapy Recommendations:  PT: Anticipated PT needs at discharge: Moderate intensity therapy - less than 3 hours of therapy per day (vs HHPT if pt continues to progress) Anticipated Caregiver Needs at Discharge: Supervision for safety/cues, Physical hands-on assistance DME Equipment Recommendations: Has all needed equipment     AM-PAC Basic Mobility Raw Score (out of 24): 16 Routine Mobility Goal: Standing 1 OR More Minutes & Wash Face, Comb Hair, Shave, Brush Teeth 5  OT:          SLP:              Home Health Orders: DME Orders (From admission, onward)    None      Home Health Agency     None       I spent 60 minutes performing discharge  services.   Electronically signed: Rosaline HERO Shipp, DO 03/28/2024 / 12:51 PM       [1] Allergies Allergen Reactions  . Alphagan P Palpitations  . Azopt [Brinzolamide] Palpitations  . Tape [Contact Dermatitis] Itching  . Timoptic Palpitations  . Xalatan [Latanoprost] Palpitations  . Cardizem Swelling    legs  . Codeine Unknown  . Diltiazem Swelling    legs  . Tamsulosin Unknown  . Vancomycin Rash  . Vioxx [Rofecoxib] Swelling    legs  . Celebrex [Celecoxib] Confusion  . Colchicine Diarrhea    sudden violent diarrhea  *Some images could not be shown.

## 2024-03-28 NOTE — Nursing Note (Addendum)
 AVS printed and given to pt. Verbal and written instructions given regarding meds, followup appointments, s/s complications to report. Pt verbalized understanding and agreed to d/c. Pt discharge with family via discharge lounge. Tele and PIV removed.

## 2024-03-31 ENCOUNTER — Telehealth: Payer: Self-pay

## 2024-03-31 NOTE — Transitions of Care (Post Inpatient/ED Visit) (Signed)
 03/31/2024  Name: Tim [name] MRN: [id number] DOB: 1940-07-12  Today's TOC FU Call Status: Today's TOC FU Call Status:: Successful TOC FU Call Completed TOC FU Call Complete Date: 03/31/24 Patient's Name and Date of Birth confirmed.  Transition Care Management Follow-up Telephone Call Date of Discharge: 03/28/24 Discharge Facility: Other ([facility]) Name of Other ([facility]) Discharge Facility: [facility] Type of Discharge: Inpatient Admission Primary Inpatient Discharge Diagnosis:: STEMI How have you been since you were released from the hospital?: Better (reports that he is sleepy. no chest pain.) Any questions or concerns?: No  Items Reviewed: Did you receive and understand the discharge instructions provided?: Yes Medications obtained,verified, and reconciled?: Partial Review Completed Reason for Partial Mediation Review: patient reports that his wife manages his medications and he does not want to review.  I did review that potassium was discontinued and patient verbalized understanding. Any new allergies since your discharge?: No Dietary orders reviewed?: Yes Type of Diet Ordered:: low salt heart healthy diet Do you have support at home?: Yes People in Home [RPT]: spouse  Medications Reviewed Today: Medications Reviewed Today     Reviewed by Rumalda Alan PENNER, RN (Registered Nurse) on 03/31/24 at 1103  Med List Status: <None>   Medication Order Taking? Sig Documenting Provider Last Dose Status Informant  albuterol  (VENTOLIN  HFA) 108 (90 Base) MCG/ACT inhaler [id number]  TAKE 2 PUFFS BY MOUTH EVERY 6 HOURS AS NEEDED FOR WHEEZE OR SHORTNESS OF BREATH McGowen, Aleene DEL, MD  Active   allopurinol  (ZYLOPRIM ) 300 MG tablet [id number]  Take 1 tablet (300 mg total) by mouth daily. with food McGowen, Aleene DEL, MD  Active   amoxicillin  (AMOXIL ) 500 MG capsule [id number]  Take 2 capsules (1,000 mg total) by mouth 2 (two) times daily. Fleeta Rothman, Jomarie SAILOR, MD  Active   apixaban   (ELIQUIS ) 5 MG TABS tablet [id number]  Take 5 mg by mouth 2 (two) times daily. [provider]  Active Self, Spouse/Significant Other, Pharmacy Records  ASPIRIN  LOW DOSE 81 MG EC tablet [id number]  Take 81 mg by mouth daily. [provider]  Active Self, Spouse/Significant Other, Pharmacy Records  B Complex-C (B-COMPLEX WITH VITAMIN C ) tablet [id number]  Take 1 tablet by mouth daily. Take 1 tablet daily, 250mg  [provider]  Active Self, Spouse/Significant Other, Pharmacy Records  diclofenac  (VOLTAREN ) 75 MG EC tablet [id number]  Take 1 tablet (75 mg total) by mouth 2 (two) times daily. Valdemar Rocky SAUNDERS, NP  Active   ezetimibe  (ZETIA ) 10 MG tablet [id number]  Take 1 tablet (10 mg total) by mouth daily. McGowen, Philip H, MD  Active Self, Spouse/Significant Other, Pharmacy Records  ferrous gluconate  [name]) 324 MG tablet [id number]  Take 1 tablet (324 mg total) by mouth 2 (two) times daily. McGowen, Philip H, MD  Active   finasteride  (PROSCAR ) 5 MG tablet [id number]  Take 1 tablet (5 mg total) by mouth daily. McGowen, Philip H, MD  Active   fluticasone  (FLONASE ) 50 MCG/ACT nasal spray [id number]  Place 2 sprays into both nostrils daily. McGowen, Philip H, MD  Active   fluticasone -salmeterol [facility]) 250-50 MCG/ACT AEPB [id number]  Inhale 1 puff into the lungs 2 (two) times daily. in the morning and at bedtime. McGowen, Philip H, MD  Active   gabapentin  (NEURONTIN ) 800 MG tablet [id number]  TAKE 1 TABLET BY MOUTH FOUR TIMES A DAY McGowen, Aleene DEL, MD  Active   Loratadine (CLARITIN PO) [id number]  Take 1 tablet by mouth in the morning and at bedtime.  [provider]  Active   mometasone -formoterol  (DULERA ) 100-5 MCG/ACT AERO [id number]  Inhale 2 puffs into the lungs as needed for wheezing or shortness of breath. [provider]  Active   Multiple Vitamin (MULTIVITAMIN ADULT PO) [id number]  Take 1 tablet by mouth daily. [provider]  Active Self,  Spouse/Significant Other, Pharmacy Records  naloxone  (NARCAN ) nasal spray 4 mg/0.1 mL [id number]  One spray in nostril as needed for opoid overdose.  May repeat dose in 5 min if needed. McGowen, Philip H, MD  Active   niacin  (NIASPAN ) 1000 MG CR tablet [id number]  TAKE 1 TABLET BY MOUTH TWICE A DAY McGowen, Aleene DEL, MD  Active   nitroGLYCERIN  (NITROSTAT ) 0.4 MG SL tablet [id number]  Place 0.4 mg under the tongue every 5 (five) minutes as needed for chest pain. [provider]  Active Self, Spouse/Significant Other, Pharmacy Records           Med Note DINO OVERCAST   Thu Nov 27, 2019  6:43 PM)    Oxycodone  HCl 20 MG TABS [id number]  1 tab po bid prn severe pain McGowen, Aleene DEL, MD  Active   pantoprazole  (PROTONIX ) 40 MG tablet [id number]  Take 1 tablet (40 mg total) by mouth daily.  Patient taking differently: Take 40 mg by mouth 2 (two) times daily.   McGowen, Philip H, MD  Active   potassium chloride  SA (KLOR-CON  M) 20 MEQ tablet [id number]  Take 1 tablet (20 mEq total) by mouth daily.  Patient not taking: Reported on 03/31/2024   McGowen, Philip H, MD  Active   rosuvastatin  (CRESTOR ) 20 MG tablet [id number]  Take 20 mg by mouth. [provider]  Active   torsemide  (DEMADEX ) 20 MG tablet [id number]  Take 1 tablet (20 mg total) by mouth 2 (two) times daily. McGowen, Philip H, MD  Active   vitamin C  (ASCORBIC ACID) 250 MG tablet [id number]  Take 250 mg by mouth daily. [provider]  Active Self, Spouse/Significant Other, Pharmacy Records  VITAMIN D, CHOLECALCIFEROL, PO [id number]  Take 1 tablet by mouth daily.  [provider]  Active Self, Spouse/Significant Other, Pharmacy Records            Home Care and Equipment/Supplies: Were Home Health Services Ordered?: No Any new equipment or medical supplies ordered?: No  Functional Questionnaire: Do you need assistance with bathing/showering or dressing?: Yes (wife) Do you need assistance with meal  preparation?: Yes (wife) Do you need assistance with eating?: No Do you have difficulty maintaining continence: Yes (BSC next to recliner) Do you need assistance with getting out of bed/getting out of a chair/moving?: Yes (uses a walker) Do you have difficulty managing or taking your medications?: No  Follow up appointments reviewed: PCP Follow-up appointment confirmed?: Yes Date of PCP follow-up appointment?: 04/07/24 Follow-up Provider: PCP Specialist Hospital Follow-up appointment confirmed?: Yes Date of Specialist follow-up appointment?: 04/10/24 Follow-Up Specialty Provider:: Cardiology Do you need transportation to your follow-up appointment?: No Do you understand care options if your condition(s) worsen?: Yes-patient verbalized understanding  SDOH Interventions Today    Flowsheet Row Most Recent Value  SDOH Interventions   Food Insecurity Interventions Intervention Not Indicated  Housing Interventions Intervention Not Indicated  Transportation Interventions Other (Comment)  [patient reports that it is harded and harded to get out of his home. He has 4 steps to go down.  Encouraged patient to call his insurance provider to inquire about his transportation needs.]   Placed call to patient to  reviewed recent discharge from [facility].  Patient declined to review medications but was able to confirm that he is no longer taking potassium.  I reviewed with patient that the fact he had refused rehab and home health. He reports that he just finished a PT episodes and he is doing his home exercises. Wife is caregiver.  Wife does daily dressing changes.    Interventions: Attempted to review medications- patient declined Reviewed when to call 911 for chest pain or shortness of breath. Reviewed home plan of care to use nitroglycerin  if needed for chest pain and call 911.  Reviewed that patient is unable to weigh due to tremors. Reviewed and encouraged patient to monitor BP several times per day  and take to his PCP and cardiology  follow ups.  Reviewed low salt heart healthy diet Reviewed wound care and assessed for difficulties- declined. Patient asked about labs draws at home and I told him he would have to have home health nursing for this and he declined this referral. Encouraged patient to discuss with PCP. Reviewed follow up appointments. Encouraged patient to get up and move as much as he can safely to avoid pressure areas.  Reviewed patients concern for transportation. Encouraged patient to call [facility] and inquire about his benefits. He agreed. Assess falls and discussed preventions.  Reviewed and offered 30 day TOC program and patient declined. I provided my contact information if patient needs to call me back. He voiced understanding.   Alan Ee, RN, BSN, CEN [employer]- Transition of Care Team.  [facility] [phone]

## 2024-04-07 ENCOUNTER — Ambulatory Visit: Admitting: Family Medicine

## 2024-04-10 ENCOUNTER — Emergency Department (HOSPITAL_COMMUNITY)

## 2024-04-10 ENCOUNTER — Inpatient Hospital Stay (HOSPITAL_COMMUNITY)

## 2024-04-10 ENCOUNTER — Inpatient Hospital Stay (HOSPITAL_COMMUNITY): Admission: EM | Admit: 2024-04-10 | Discharge: 2024-04-18 | DRG: 682 | Disposition: A | Discharge status: Home Health

## 2024-04-10 ENCOUNTER — Other Ambulatory Visit: Payer: Self-pay

## 2024-04-10 ENCOUNTER — Encounter (HOSPITAL_COMMUNITY): Payer: Self-pay

## 2024-04-10 DIAGNOSIS — Z1152 Encounter for screening for COVID-19: Secondary | ICD-10-CM

## 2024-04-10 DIAGNOSIS — E876 Hypokalemia: Secondary | ICD-10-CM | POA: Diagnosis present

## 2024-04-10 DIAGNOSIS — I255 Ischemic cardiomyopathy: Secondary | ICD-10-CM | POA: Diagnosis present

## 2024-04-10 DIAGNOSIS — R571 Hypovolemic shock: Secondary | ICD-10-CM | POA: Diagnosis present

## 2024-04-10 DIAGNOSIS — I340 Nonrheumatic mitral (valve) insufficiency: Secondary | ICD-10-CM | POA: Diagnosis not present

## 2024-04-10 DIAGNOSIS — I428 Other cardiomyopathies: Secondary | ICD-10-CM | POA: Diagnosis present

## 2024-04-10 DIAGNOSIS — Z8679 Personal history of other diseases of the circulatory system: Secondary | ICD-10-CM

## 2024-04-10 DIAGNOSIS — Z7901 Long term (current) use of anticoagulants: Secondary | ICD-10-CM

## 2024-04-10 DIAGNOSIS — K219 Gastro-esophageal reflux disease without esophagitis: Secondary | ICD-10-CM | POA: Diagnosis present

## 2024-04-10 DIAGNOSIS — Z794 Long term (current) use of insulin: Secondary | ICD-10-CM

## 2024-04-10 DIAGNOSIS — Z79899 Other long term (current) drug therapy: Secondary | ICD-10-CM | POA: Diagnosis not present

## 2024-04-10 DIAGNOSIS — R410 Disorientation, unspecified: Secondary | ICD-10-CM | POA: Diagnosis present

## 2024-04-10 DIAGNOSIS — T426X5A Adverse effect of other antiepileptic and sedative-hypnotic drugs, initial encounter: Secondary | ICD-10-CM | POA: Diagnosis present

## 2024-04-10 DIAGNOSIS — G4733 Obstructive sleep apnea (adult) (pediatric): Secondary | ICD-10-CM

## 2024-04-10 DIAGNOSIS — Y831 Surgical operation with implant of artificial internal device as the cause of abnormal reaction of the patient, or of later complication, without mention of misadventure at the time of the procedure: Secondary | ICD-10-CM | POA: Diagnosis present

## 2024-04-10 DIAGNOSIS — R579 Shock, unspecified: Secondary | ICD-10-CM | POA: Diagnosis present

## 2024-04-10 DIAGNOSIS — Z7951 Long term (current) use of inhaled steroids: Secondary | ICD-10-CM

## 2024-04-10 DIAGNOSIS — G928 Other toxic encephalopathy: Secondary | ICD-10-CM | POA: Diagnosis present

## 2024-04-10 DIAGNOSIS — E66811 Obesity, class 1: Secondary | ICD-10-CM | POA: Diagnosis present

## 2024-04-10 DIAGNOSIS — I480 Paroxysmal atrial fibrillation: Secondary | ICD-10-CM | POA: Diagnosis present

## 2024-04-10 DIAGNOSIS — R319 Hematuria, unspecified: Secondary | ICD-10-CM | POA: Diagnosis not present

## 2024-04-10 DIAGNOSIS — I2510 Atherosclerotic heart disease of native coronary artery without angina pectoris: Secondary | ICD-10-CM | POA: Diagnosis present

## 2024-04-10 DIAGNOSIS — Z8711 Personal history of peptic ulcer disease: Secondary | ICD-10-CM

## 2024-04-10 DIAGNOSIS — E669 Obesity, unspecified: Secondary | ICD-10-CM | POA: Diagnosis not present

## 2024-04-10 DIAGNOSIS — Z886 Allergy status to analgesic agent status: Secondary | ICD-10-CM

## 2024-04-10 DIAGNOSIS — I110 Hypertensive heart disease with heart failure: Secondary | ICD-10-CM | POA: Diagnosis present

## 2024-04-10 DIAGNOSIS — E872 Acidosis, unspecified: Secondary | ICD-10-CM | POA: Diagnosis present

## 2024-04-10 DIAGNOSIS — N400 Enlarged prostate without lower urinary tract symptoms: Secondary | ICD-10-CM | POA: Diagnosis not present

## 2024-04-10 DIAGNOSIS — Z95810 Presence of automatic (implantable) cardiac defibrillator: Secondary | ICD-10-CM | POA: Diagnosis not present

## 2024-04-10 DIAGNOSIS — G253 Myoclonus: Secondary | ICD-10-CM | POA: Diagnosis present

## 2024-04-10 DIAGNOSIS — Z79891 Long term (current) use of opiate analgesic: Secondary | ICD-10-CM

## 2024-04-10 DIAGNOSIS — N179 Acute kidney failure, unspecified: Secondary | ICD-10-CM | POA: Diagnosis present

## 2024-04-10 DIAGNOSIS — Z66 Do not resuscitate: Secondary | ICD-10-CM | POA: Diagnosis present

## 2024-04-10 DIAGNOSIS — I272 Pulmonary hypertension, unspecified: Secondary | ICD-10-CM | POA: Diagnosis present

## 2024-04-10 DIAGNOSIS — Z884 Allergy status to anesthetic agent status: Secondary | ICD-10-CM

## 2024-04-10 DIAGNOSIS — A419 Sepsis, unspecified organism: Secondary | ICD-10-CM | POA: Diagnosis not present

## 2024-04-10 DIAGNOSIS — M7989 Other specified soft tissue disorders: Secondary | ICD-10-CM | POA: Diagnosis not present

## 2024-04-10 DIAGNOSIS — Z8249 Family history of ischemic heart disease and other diseases of the circulatory system: Secondary | ICD-10-CM

## 2024-04-10 DIAGNOSIS — T82857D Stenosis of cardiac prosthetic devices, implants and grafts, subsequent encounter: Secondary | ICD-10-CM | POA: Diagnosis not present

## 2024-04-10 DIAGNOSIS — N19 Unspecified kidney failure: Secondary | ICD-10-CM

## 2024-04-10 DIAGNOSIS — R578 Other shock: Secondary | ICD-10-CM

## 2024-04-10 DIAGNOSIS — J4489 Other specified chronic obstructive pulmonary disease: Secondary | ICD-10-CM | POA: Diagnosis present

## 2024-04-10 DIAGNOSIS — I5042 Chronic combined systolic (congestive) and diastolic (congestive) heart failure: Secondary | ICD-10-CM | POA: Diagnosis present

## 2024-04-10 DIAGNOSIS — S81812A Laceration without foreign body, left lower leg, initial encounter: Secondary | ICD-10-CM | POA: Diagnosis present

## 2024-04-10 DIAGNOSIS — I330 Acute and subacute infective endocarditis: Secondary | ICD-10-CM | POA: Diagnosis present

## 2024-04-10 DIAGNOSIS — I3481 Nonrheumatic mitral (valve) annulus calcification: Secondary | ICD-10-CM | POA: Diagnosis not present

## 2024-04-10 DIAGNOSIS — I252 Old myocardial infarction: Secondary | ICD-10-CM

## 2024-04-10 DIAGNOSIS — R34 Anuria and oliguria: Secondary | ICD-10-CM | POA: Diagnosis present

## 2024-04-10 DIAGNOSIS — M109 Gout, unspecified: Secondary | ICD-10-CM

## 2024-04-10 DIAGNOSIS — Z6831 Body mass index (BMI) 31.0-31.9, adult: Secondary | ICD-10-CM | POA: Diagnosis not present

## 2024-04-10 DIAGNOSIS — I959 Hypotension, unspecified: Secondary | ICD-10-CM | POA: Diagnosis present

## 2024-04-10 DIAGNOSIS — E7800 Pure hypercholesterolemia, unspecified: Secondary | ICD-10-CM | POA: Diagnosis present

## 2024-04-10 DIAGNOSIS — Z91048 Other nonmedicinal substance allergy status: Secondary | ICD-10-CM

## 2024-04-10 DIAGNOSIS — R310 Gross hematuria: Secondary | ICD-10-CM | POA: Diagnosis present

## 2024-04-10 DIAGNOSIS — Z888 Allergy status to other drugs, medicaments and biological substances status: Secondary | ICD-10-CM

## 2024-04-10 DIAGNOSIS — K5903 Drug induced constipation: Secondary | ICD-10-CM | POA: Diagnosis present

## 2024-04-10 DIAGNOSIS — G894 Chronic pain syndrome: Secondary | ICD-10-CM | POA: Diagnosis present

## 2024-04-10 DIAGNOSIS — E871 Hypo-osmolality and hyponatremia: Secondary | ICD-10-CM | POA: Diagnosis present

## 2024-04-10 DIAGNOSIS — T82857A Stenosis of cardiac prosthetic devices, implants and grafts, initial encounter: Secondary | ICD-10-CM | POA: Diagnosis present

## 2024-04-10 DIAGNOSIS — Z955 Presence of coronary angioplasty implant and graft: Secondary | ICD-10-CM

## 2024-04-10 DIAGNOSIS — Z952 Presence of prosthetic heart valve: Secondary | ICD-10-CM | POA: Diagnosis not present

## 2024-04-10 DIAGNOSIS — I10 Essential (primary) hypertension: Secondary | ICD-10-CM | POA: Diagnosis not present

## 2024-04-10 DIAGNOSIS — Z7982 Long term (current) use of aspirin: Secondary | ICD-10-CM

## 2024-04-10 DIAGNOSIS — Z881 Allergy status to other antibiotic agents status: Secondary | ICD-10-CM

## 2024-04-10 DIAGNOSIS — R5381 Other malaise: Secondary | ICD-10-CM | POA: Diagnosis present

## 2024-04-10 DIAGNOSIS — I350 Nonrheumatic aortic (valve) stenosis: Secondary | ICD-10-CM | POA: Diagnosis not present

## 2024-04-10 DIAGNOSIS — T8209XD Other mechanical complication of heart valve prosthesis, subsequent encounter: Secondary | ICD-10-CM | POA: Diagnosis not present

## 2024-04-10 DIAGNOSIS — I058 Other rheumatic mitral valve diseases: Secondary | ICD-10-CM | POA: Diagnosis not present

## 2024-04-10 DIAGNOSIS — M170 Bilateral primary osteoarthritis of knee: Secondary | ICD-10-CM | POA: Diagnosis present

## 2024-04-10 DIAGNOSIS — Z86718 Personal history of other venous thrombosis and embolism: Secondary | ICD-10-CM

## 2024-04-10 DIAGNOSIS — Z882 Allergy status to sulfonamides status: Secondary | ICD-10-CM

## 2024-04-10 DIAGNOSIS — G5792 Unspecified mononeuropathy of left lower limb: Secondary | ICD-10-CM | POA: Diagnosis present

## 2024-04-10 LAB — I-STAT CHEM 8, ED
BUN: 97 mg/dL — ABNORMAL HIGH (ref 8–23)
Calcium, Ion: 1.11 mmol/L — ABNORMAL LOW (ref 1.15–1.40)
Chloride: 95 mmol/L — ABNORMAL LOW (ref 98–111)
Creatinine, Ser: 3.3 mg/dL — ABNORMAL HIGH (ref 0.61–1.24)
Glucose, Bld: 140 mg/dL — ABNORMAL HIGH (ref 70–99)
HCT: 35 % — ABNORMAL LOW (ref 39.0–52.0)
Hemoglobin: 11.9 g/dL — ABNORMAL LOW (ref 13.0–17.0)
Potassium: 5.5 mmol/L — ABNORMAL HIGH (ref 3.5–5.1)
Sodium: 130 mmol/L — ABNORMAL LOW (ref 135–145)
TCO2: 29 mmol/L (ref 22–32)

## 2024-04-10 LAB — URINALYSIS, ROUTINE W REFLEX MICROSCOPIC
Bilirubin Urine: NEGATIVE
Glucose, UA: NEGATIVE mg/dL
Hgb urine dipstick: NEGATIVE
Ketones, ur: NEGATIVE mg/dL
Leukocytes,Ua: NEGATIVE
Nitrite: NEGATIVE
Protein, ur: NEGATIVE mg/dL
Specific Gravity, Urine: 1.014 (ref 1.005–1.030)
pH: 5 (ref 5.0–8.0)

## 2024-04-10 LAB — ECHOCARDIOGRAM COMPLETE
AR max vel: 0.72 cm2
AV Area VTI: 0.76 cm2
AV Area mean vel: 0.66 cm2
AV Mean grad: 46 mmHg
AV Peak grad: 83.9 mmHg
Ao pk vel: 4.58 m/s
Area-P 1/2: 4.46 cm2
Calc EF: 70.8 %
Height: 76 in
MV VTI: 1.56 cm2
S' Lateral: 1.7 cm
Single Plane A2C EF: 69.3 %
Single Plane A4C EF: 70.5 %
Weight: 4194.03 [oz_av]

## 2024-04-10 LAB — CBC WITH DIFFERENTIAL/PLATELET
Abs Immature Granulocytes: 0.04 K/uL (ref 0.00–0.07)
Basophils Absolute: 0 K/uL (ref 0.0–0.1)
Basophils Relative: 0 %
Eosinophils Absolute: 0.1 K/uL (ref 0.0–0.5)
Eosinophils Relative: 2 %
HCT: 35 % — ABNORMAL LOW (ref 39.0–52.0)
Hemoglobin: 11.9 g/dL — ABNORMAL LOW (ref 13.0–17.0)
Immature Granulocytes: 0 %
Lymphocytes Relative: 20 %
Lymphs Abs: 1.8 K/uL (ref 0.7–4.0)
MCH: 32.8 pg (ref 26.0–34.0)
MCHC: 34 g/dL (ref 30.0–36.0)
MCV: 96.4 fL (ref 80.0–100.0)
Monocytes Absolute: 0.9 K/uL (ref 0.1–1.0)
Monocytes Relative: 10 %
Neutro Abs: 6.2 K/uL (ref 1.7–7.7)
Neutrophils Relative %: 68 %
Platelets: 153 K/uL (ref 150–400)
RBC: 3.63 MIL/uL — ABNORMAL LOW (ref 4.22–5.81)
RDW: 14.4 % (ref 11.5–15.5)
WBC: 9 K/uL (ref 4.0–10.5)
nRBC: 0 % (ref 0.0–0.2)

## 2024-04-10 LAB — RESP PANEL BY RT-PCR (RSV, FLU A&B, COVID)  RVPGX2
Influenza A by PCR: NEGATIVE
Influenza B by PCR: NEGATIVE
Resp Syncytial Virus by PCR: NEGATIVE
SARS Coronavirus 2 by RT PCR: NEGATIVE

## 2024-04-10 LAB — LACTIC ACID, PLASMA
Lactic Acid, Venous: 0.8 mmol/L (ref 0.5–1.9)
Lactic Acid, Venous: 0.7 mmol/L (ref 0.5–1.9)
Lactic Acid, Venous: 0.9 mmol/L (ref 0.5–1.9)

## 2024-04-10 LAB — APTT: aPTT: 33 s (ref 24–36)

## 2024-04-10 LAB — I-STAT CG4 LACTIC ACID, ED
Lactic Acid, Venous: 2 mmol/L (ref 0.5–1.9)
Lactic Acid, Venous: 2 mmol/L (ref 0.5–1.9)

## 2024-04-10 LAB — COMPREHENSIVE METABOLIC PANEL WITH GFR
ALT: 36 U/L (ref 0–44)
AST: 75 U/L — ABNORMAL HIGH (ref 15–41)
Albumin: 3.4 g/dL — ABNORMAL LOW (ref 3.5–5.0)
Alkaline Phosphatase: 90 U/L (ref 38–126)
Anion gap: 14 (ref 5–15)
BUN: 83 mg/dL — ABNORMAL HIGH (ref 8–23)
CO2: 25 mmol/L (ref 22–32)
Calcium: 9 mg/dL (ref 8.9–10.3)
Chloride: 91 mmol/L — ABNORMAL LOW (ref 98–111)
Creatinine, Ser: 3.12 mg/dL — ABNORMAL HIGH (ref 0.61–1.24)
GFR, Estimated: 19 mL/min — ABNORMAL LOW (ref >60)
Glucose, Bld: 144 mg/dL — ABNORMAL HIGH (ref 70–99)
Potassium: 4.9 mmol/L (ref 3.5–5.1)
Sodium: 130 mmol/L — ABNORMAL LOW (ref 135–145)
Total Bilirubin: 0.9 mg/dL (ref 0.0–1.2)
Total Protein: 6.5 g/dL (ref 6.5–8.1)

## 2024-04-10 LAB — RAPID URINE DRUG SCREEN, HOSP PERFORMED
Amphetamines: NOT DETECTED
Barbiturates: NOT DETECTED
Benzodiazepines: NOT DETECTED
Cocaine: NOT DETECTED
Opiates: POSITIVE — AB
Tetrahydrocannabinol: NOT DETECTED

## 2024-04-10 LAB — BRAIN NATRIURETIC PEPTIDE: B Natriuretic Peptide: 60.6 pg/mL (ref 0.0–100.0)

## 2024-04-10 LAB — CORTISOL: Cortisol, Plasma: 8.4 ug/dL

## 2024-04-10 LAB — TROPONIN I (HIGH SENSITIVITY): Troponin I (High Sensitivity): 10 ng/L

## 2024-04-10 LAB — PROCALCITONIN: Procalcitonin: 0.13 ng/mL

## 2024-04-10 LAB — GLUCOSE, CAPILLARY
Glucose-Capillary: 160 mg/dL — ABNORMAL HIGH (ref 70–99)
Glucose-Capillary: 161 mg/dL — ABNORMAL HIGH (ref 70–99)
Glucose-Capillary: 163 mg/dL — ABNORMAL HIGH (ref 70–99)
Glucose-Capillary: 196 mg/dL — ABNORMAL HIGH (ref 70–99)

## 2024-04-10 LAB — MRSA NEXT GEN BY PCR, NASAL: MRSA by PCR Next Gen: NOT DETECTED

## 2024-04-10 LAB — CK: Total CK: 53 U/L (ref 49–397)

## 2024-04-10 LAB — ETHANOL: Alcohol, Ethyl (B): <15 mg/dL (ref <15)

## 2024-04-10 LAB — PROTIME-INR
INR: 1.9 — ABNORMAL HIGH (ref 0.8–1.2)
Prothrombin Time: 22.5 s — ABNORMAL HIGH (ref 11.4–15.2)

## 2024-04-10 MED ORDER — SODIUM CHLORIDE 0.9 % IV BOLUS
1000.0000 mL | Freq: Once | INTRAVENOUS | Status: AC
  Administered 2024-04-10: 1000 mL via INTRAVENOUS

## 2024-04-10 MED ORDER — SODIUM ZIRCONIUM CYCLOSILICATE 10 G PO PACK
10.0000 g | PACK | Freq: Every day | ORAL | Status: AC
  Administered 2024-04-10: 10 g via ORAL
  Filled 2024-04-10: qty 1

## 2024-04-10 MED ORDER — DOCUSATE SODIUM 100 MG PO CAPS: 100.0000 mg | ORAL_CAPSULE | Freq: Two times a day (BID) | ORAL | Status: DC | PRN

## 2024-04-10 MED ORDER — ACETAMINOPHEN 325 MG PO TABS
650.0000 mg | ORAL_TABLET | Freq: Four times a day (QID) | ORAL | Status: DC | PRN
  Administered 2024-04-11 – 2024-04-15 (×3): 650 mg via ORAL
  Filled 2024-04-10 (×3): qty 2

## 2024-04-10 MED ORDER — PIPERACILLIN-TAZOBACTAM 3.375 G IVPB
3.3750 g | Freq: Three times a day (TID) | INTRAVENOUS | Status: DC
  Administered 2024-04-10 – 2024-04-11 (×3): 3.375 g via INTRAVENOUS
  Filled 2024-04-10 (×3): qty 50

## 2024-04-10 MED ORDER — ASPIRIN 81 MG PO CHEW
81.0000 mg | CHEWABLE_TABLET | Freq: Every day | ORAL | Status: DC
Start: 2024-04-10 — End: 2024-04-11
  Filled 2024-04-10: qty 1

## 2024-04-10 MED ORDER — APIXABAN 2.5 MG PO TABS
2.5000 mg | ORAL_TABLET | Freq: Two times a day (BID) | ORAL | Status: DC
  Administered 2024-04-10 – 2024-04-11 (×2): 2.5 mg via ORAL
  Filled 2024-04-10 (×2): qty 1

## 2024-04-10 MED ORDER — ORAL CARE MOUTH RINSE
15.0000 mL | OROMUCOSAL | Status: AC | PRN
Start: 2024-04-10 — End: ?

## 2024-04-10 MED ORDER — SODIUM CHLORIDE 0.9 % IV SOLN
2.0000 g | Freq: Once | INTRAVENOUS | Status: AC
  Administered 2024-04-10: 2 g via INTRAVENOUS
  Filled 2024-04-10: qty 20

## 2024-04-10 MED ORDER — FENTANYL CITRATE PF 50 MCG/ML IJ SOSY
50.0000 ug | PREFILLED_SYRINGE | Freq: Once | INTRAMUSCULAR | Status: AC
  Administered 2024-04-10: 50 ug via INTRAVENOUS
  Filled 2024-04-10: qty 1

## 2024-04-10 MED ORDER — NYSTATIN 100000 UNIT/GM EX POWD
Freq: Two times a day (BID) | CUTANEOUS | Status: DC
  Administered 2024-04-13 – 2024-04-18 (×2): 1 via TOPICAL
  Filled 2024-04-10: qty 15

## 2024-04-10 MED ORDER — SODIUM CHLORIDE 0.9 % IV SOLN
250.0000 mL | INTRAVENOUS | Status: AC
  Administered 2024-04-10: 250 mL via INTRAVENOUS

## 2024-04-10 MED ORDER — CHLORHEXIDINE GLUCONATE CLOTH 2 % EX PADS
6.0000 | MEDICATED_PAD | Freq: Every day | CUTANEOUS | Status: DC
  Administered 2024-04-10 – 2024-04-18 (×9): 6 via TOPICAL

## 2024-04-10 MED ORDER — PIPERACILLIN-TAZOBACTAM 3.375 G IVPB 30 MIN
3.3750 g | Freq: Once | INTRAVENOUS | Status: AC
  Administered 2024-04-10: 3.375 g via INTRAVENOUS
  Filled 2024-04-10: qty 50

## 2024-04-10 MED ORDER — PERFLUTREN LIPID MICROSPHERE
1.0000 mL | INTRAVENOUS | Status: AC | PRN
  Administered 2024-04-10: 2 mL via INTRAVENOUS

## 2024-04-10 MED ORDER — NOREPINEPHRINE 4 MG/250ML-% IV SOLN
0.0000 ug/min | INTRAVENOUS | Status: DC
  Administered 2024-04-10: 6 ug/min via INTRAVENOUS
  Administered 2024-04-10: 5 ug/min via INTRAVENOUS
  Administered 2024-04-11: 6 ug/min via INTRAVENOUS
  Administered 2024-04-12: 3 ug/min via INTRAVENOUS
  Filled 2024-04-10 (×4): qty 250

## 2024-04-10 MED ORDER — LINEZOLID 600 MG/300ML IV SOLN
600.0000 mg | Freq: Two times a day (BID) | INTRAVENOUS | Status: DC
  Administered 2024-04-10 – 2024-04-11 (×3): 600 mg via INTRAVENOUS
  Filled 2024-04-10 (×3): qty 300

## 2024-04-10 MED ORDER — FENTANYL CITRATE (PF) 50 MCG/ML IJ SOSY
12.5000 ug | PREFILLED_SYRINGE | INTRAMUSCULAR | Status: DC | PRN
Start: 2024-04-10 — End: 2024-04-11
  Administered 2024-04-11: 12.5 ug via INTRAVENOUS
  Filled 2024-04-10: qty 1

## 2024-04-10 MED ORDER — FENTANYL CITRATE (PF) 50 MCG/ML IJ SOSY
12.5000 ug | PREFILLED_SYRINGE | Freq: Once | INTRAMUSCULAR | Status: AC
  Administered 2024-04-10: 12.5 ug via INTRAVENOUS
  Filled 2024-04-10: qty 1

## 2024-04-10 MED ORDER — LIDOCAINE 5 % EX PTCH
1.0000 | MEDICATED_PATCH | CUTANEOUS | Status: DC
  Administered 2024-04-10 – 2024-04-17 (×8): 1 via TRANSDERMAL
  Filled 2024-04-10 (×9): qty 1

## 2024-04-10 MED ORDER — POLYETHYLENE GLYCOL 3350 17 G PO PACK: 17.0000 g | PACK | Freq: Every day | ORAL | Status: DC | PRN

## 2024-04-10 MED ORDER — LACTATED RINGERS IV BOLUS
1000.0000 mL | Freq: Once | INTRAVENOUS | Status: AC
Start: 2024-04-10 — End: 2024-04-10
  Administered 2024-04-10: 1000 mL via INTRAVENOUS

## 2024-04-10 NOTE — H&P (Signed)
 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  His wife notes over the weekend he was a lot little more confused.  He had worsening neuropathy symptoms and was taking extra gabapentin  and extra oxycodone .  She then notes over the next couple of days of the decreased urine output, decreased oral intake.  She checked his blood pressure at home and it was reading low.  At 1 point he was having such severe neuropathy that he fell and developed a left leg laceration.  The bleeding on this has since stopped.  His wife called EMS overnight due to the confusion and ended up bringing him in.  The patient was confused and lethargic when he came in but since receiving some IV fluids and vasopressors he seems more alert.  He is able to deny any fevers chills night sweats or weight loss.  He denies any cough or shortness of breath or chest pain.  He has some mild dysuria.  No abdominal pain or nausea.  No new skin rashes other than the laceration.  PCCM is consulted on this patient with hypotension requiring vasopressors and confusion.   Pertinent  Medical History  CAD s/p PCI, unstable angina, cannot intervene S/p BiV ICD H/o TAVR, bioprosthetic endocarditis on suppressive amoxicllin Gout [place] Htn Chronic pain on opioids GERD  Significant Hospital Events: Including procedures, antibiotic start and stop dates in addition to other pertinent events     Interim History / Subjective:    Objective    Blood pressure (!) 118/57, pulse 82, temperature 98.3 F (36.8 C),  temperature source Oral, resp. rate 15, height 6' 4 (1.93 m), weight 118.9 kg, SpO2 97%.        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 - repeat echocardiogram today - hold diuretics, anti-hypertensives  Oliguric AKI - likely pre-renal vs  intrinsic - follow ua - follow uop  OSA on CPAP - nocturnal cpap, per home settings  Code status - verified DNR/DNI with family at bedside  Chronic pain on oxycodone  and gabapentin  at home Myoclonus/Jerking Suspect gabapentin  accumulation due to renal failure Hold gabapentin   BPH - proscar  at home -  may need  foley  Gout Hold allopurinol  given AKI    Labs   CBC: Recent Labs  Lab 04/10/24 0548 04/10/24 0601  WBC 9.0  --   NEUTROABS 6.2  --   HGB 11.9* 11.9*  HCT 35.0* 35.0*  MCV 96.4  --   PLT 153  --     Basic Metabolic Panel: Recent Labs  Lab 04/10/24 0548 04/10/24 0601  NA 130* 130*  K 4.9 5.5*  CL 91* 95*  CO2 25  --   GLUCOSE 144* 140*  BUN 83* 97*  CREATININE 3.12* 3.30*  CALCIUM  9.0  --    GFR: Estimated Creatinine Clearance: 23.5 mL/min (A) (by C-G formula based on SCr of 3.3 mg/dL (H)). Recent Labs  Lab 04/10/24 0548 04/10/24 0558 04/10/24 0841 04/10/24 0844 04/10/24 0942  WBC 9.0  --   --   --   --   LATICACIDVEN  --  2.0* 2.0* 0.8 0.7    Liver Function Tests: Recent Labs  Lab 04/10/24 0548  AST 75*  ALT 36  ALKPHOS 90  BILITOT 0.9  PROT 6.5  ALBUMIN 3.4*   No results for input(s): LIPASE, AMYLASE in the last 168 hours. No results for input(s): AMMONIA in the last 168 hours.  ABG    Component Value Date/Time   PHART 7.397 12/24/2012 1719   PCO2ART 40.9 12/24/2012 1719   PO2ART 87.0 12/24/2012 1719   HCO3 26.7 (H) 12/24/2012 1726   TCO2 29 04/10/2024 0601   O2SAT 69.0 12/24/2012 1726     Coagulation Profile: Recent Labs  Lab 04/10/24 0618  INR 1.9*    Cardiac Enzymes: Recent Labs  Lab 04/10/24 0942  CKTOTAL 53    HbA1C: Hemoglobin A1C  Date/Time Value Ref Range Status  03/06/2024 02:50 PM 5.8 (A) 4.0 - 5.6 % Final  06/08/2023 03:20 PM 5.5 4.0 - 5.6 % Final   HbA1c, POC (prediabetic range)  Date/Time Value Ref Range Status  03/06/2024 02:50 PM 5.8 5.7 - 6.4 % Final  06/08/2023 03:20 PM 5.5 (A) 5.7 - 6.4 % Final   HbA1c, POC (controlled diabetic range)  Date/Time Value Ref Range Status  03/06/2024 02:50 PM 5.8 0.0 - 7.0 % Final  06/08/2023 03:20 PM 5.5 0.0 - 7.0 % Final   HbA1c POC (<> result, manual entry)  Date/Time Value Ref Range Status  03/06/2024 02:50 PM 5.8 4.0 - 5.6 % Final  06/08/2023 03:20 PM  5.5 4.0 - 5.6 % Final    CBG: Recent Labs  Lab 04/10/24 1043  GLUCAP 161*    Review of Systems:   + confusion, dizziness, neuropathy, dysuria - fevers chills night sweats hematuria Rest per HPI above Past Medical History:  He,  has a past medical history of AICD (automatic cardioverter/defibrillator) present, Balanitis, BPH (benign prostatic hypertrophy), CAD (coronary artery disease), CATARACT, HX OF, Chronic combined systolic and diastolic CHF (congestive heart failure) (HCC) (05/2016), Chronic pain syndrome, Complicated UTI (urinary tract infection) (09/2019), COPD, Debilitated patient, Diabetes mellitus type 2 with complications (HCC), Enterococcal bacteremia (09/2019), Essential hypertension, benign, Fatty liver (2007), Generalized weakness, GERD (gastroesophageal reflux disease), Glaucoma, GOUT, HH (hiatus hernia), HYPERCHOLESTEROLEMIA-PURE, Hypogonadism male, ICD (implantable cardioverter-defibrillator) infection (  12/22/2019), 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), Iron deficiency anemia, Lumbosacral neuritis, Lumbosacral spondylosis, Morbid obesity (HCC), Normal memory function (08/2014), Normocytic anemia (03/2016), NSTEMI (non-ST elevated myocardial infarction) (HCC) (03/20/2016), Obesity hypoventilation syndrome (HCC), Orthostatic hypotension, OSA on CPAP, OSTEOARTHRITIS, 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), Paroxysmal atrial fibrillation (HCC) (2003), Peripheral neuropathy, Personal history of colonic adenoma (10/30/2012), PFO (patent foramen ovale) (09/2019), Physical deconditioning (02/23/2021), Presence of cardiac defibrillator (11/07/2017), Primary osteoarthritis of both knees, 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   Surgical History:   Past Surgical History:  Procedure Laterality Date   AMPUTATION Left 04/11/2013   Procedure: AMPUTATION DIGIT Left 3rd toe;  Surgeon: Jerona LULLA Sage, MD;  Location: [facility] OR;  Service: Orthopedics;  Laterality: Left;  Left 3rd toe amputation at [facility]   AMPUTATION Left 08/29/2019   Procedure: LEFT TRANSMETATARSAL AMPUTATION;  Surgeon: Sage Jerona LULLA, MD;  Location: [facility] OR;  Service: Orthopedics;  Laterality: Left;   BIOPSY  05/04/2020   Procedure: BIOPSY;  Surgeon: Charlanne Groom, MD;  Location: [name] ENDOSCOPY;  Service: Endoscopy;;   CARDIAC CATHETERIZATION  1997; 03/10/16   1997 Non-obstructive disease.  03/2016 BMS to RCA, with 25% pDiag dz, o/w normal cors per cath 03/07/16.  Cath 11/01/16: in stent restenosis, successful baloon angioplasty. 08/2020 branch vessel dz, cont medical therapy rec'd.   CARDIAC CATHETERIZATION  12/24/2012   mild < 20% LCx, prox 30% RCA; LVEF 55-65% , moderate pulmonary HTN, moderate AS   CARDIAC DEFIBRILLATOR PLACEMENT  11/07/2017   Claria MRI Quad CRT defibrillator   CARDIOVASCULAR STRESS TEST  05/11/16 ([facility])   2017 Myocardial perfusion imaging:  No ischemia; scar in apex, global hypokinesis, EF 36%.  06/15/20->mod primarily fixed inferolat wall defect mildly worse with stress c/w infarct/scar with mild peri-infarct ischemia, normal EF-->for cath per cards.   Carotid dopplers  03/09/2016   [facility]: no hemodynamically significant stenosis on either side.   CHOLECYSTECTOMY     COLONOSCOPY [name] 10/30/2012   Procedure: COLONOSCOPY;  Surgeon: Lupita FORBES Commander, MD;  Location: [facility] ENDOSCOPY;  Service: Endoscopy;  Laterality: [name];   CORONARY ANGIOPLASTY WITH STENT PLACEMENT  03/2016; 04/2017   2017-[facility]: BMS to RCA-pt was placed on Brilinta.  04/2017: DES to RCA.   DORSAL SLIT  [name] 10/29/2019   for severe phimosis. Procedure: DORSAL SLIT;  Surgeon: Alvaro Hummer, MD;  Location: [facility] ORS;  Service: Urology;  Laterality: [name];  45 MINS   ESOPHAGEAL DILATION  05/04/2020   Procedure: ESOPHAGEAL DILATION;  Surgeon: Charlanne Groom, MD;  Location: [facility] ENDOSCOPY;  Service: Endoscopy;;   ESOPHAGOGASTRODUODENOSCOPY (EGD) WITH PROPOFOL  [name] 05/04/2020   Procedure: ESOPHAGOGASTRODUODENOSCOPY (EGD) WITH PROPOFOL ;  Surgeon: Charlanne Groom, MD;  Location: [facility] ENDOSCOPY;  Service: Endoscopy;  Laterality: [name];   EYE SURGERY Bilateral cataract   HEMORRHOID SURGERY     INTRAOCULAR LENS INSERTION Bilateral    KNEE SURGERY Right    LEFT AND RIGHT HEART CATHETERIZATION WITH CORONARY ANGIOGRAM [name] 12/24/2012   Procedure: LEFT AND RIGHT HEART CATHETERIZATION WITH CORONARY ANGIOGRAM;  Surgeon: Peter M Swaziland, MD;  Location: [facility] CATH LAB;  Service: Cardiovascular;  Laterality: [name];   LEG SURGERY Bilateral    lenghtening    PACEMAKER PLACEMENT  04/13/2016   2nd deg HB  after TAVR, pt had DC MDT PPM placed.   SHOULDER ARTHROSCOPY  08/30/2011   Procedure: ARTHROSCOPY SHOULDER;  Surgeon: Jerona LULLA Sage, MD;  Location[facility] OR;  Service: Orthopedics;  Laterality: Right;  Right Shoulder Arthroscopy, Debridement, and Decompression   SPINAL CORD STIMULATOR INSERTION [name] 09/10/2015   Procedure: LUMBAR SPINAL CORD STIMULATOR INSERTION;  Surgeon: Deward Fabian, MD;  Location: [facility] NEURO ORS;  Service: Neurosurgery;  Laterality: [name];   TEE WITHOUT CARDIOVERSION [name] 09/16/2019   Procedure: TRANSESOPHAGEAL ECHOCARDIOGRAM (TEE);  Surgeon: Raford Riggs, MD;  Location: [facility] ENDOSCOPY;  Service: Cardiovascular;  Laterality: [name];   TEE WITHOUT CARDIOVERSION [name] 12/02/2019   +vegetation on AVR and pacer lead in RV.  EF 55-60%, normal wall motion.  Valves function normal.  Procedure: TRANSESOPHAGEAL ECHOCARDIOGRAM (TEE);  Surgeon: Barbaraann Darryle Ned, MD;  Location: [name] ENDOSCOPY;  Service: Cardiovascular;  Laterality:  [name];   TOE AMPUTATION Left    due to osteomyelitis.  R big toe surg due to osteoarth   TONSILLECTOMY     traeculectomy Left    eye   TRANSCATHETER AORTIC VALVE REPLACEMENT, TRANSFEMORAL  04/11/2016   TRANSESOPHAGEAL ECHOCARDIOGRAM  03/09/2016; 09/2019   [facility]: EF 55-60%, PFO seen with bi-directional shunting, no thrombus in appendage.  09/2019 ->no valvular vegetations. Small patent foramen ovale with predominantly left to right shunting across the interatrial septum.   TRANSTHORACIC ECHOCARDIOGRAM  01/2015; 01/2016; 05/18/16; 09/18/16, 05/2017, 08/2017   01/2015 No signif change in aortic stenosis (moderate).  01/2016 Severe LVH w/small LV cavity, EF 60-65%, grade I diast dysfxn.  05/2016 (s/p TAVR): EF 50-55%, grd I DD, biopros AV good.  08/2016--EF 50-55%, LV septal motion c/w conduction abnl, grd I DD,mild MS,bioprosth aortic valve well seated, w/trace AR. 05/2017 TTE EF 35%. 08/2017-EF 35%, mod diff hypokin LV, grd I DD, biopros AV good.    TRANSTHORACIC ECHOCARDIOGRAM  04/2018; 09/2019   04/2018: EF 40-45%, mod diffuse LV hypokin, grd I DD, bioprosth AV well seated, no AS or AR. 09/2019 EF 60-65%, grd I DD, valves fine, including bioprosth AV. 04/29/20 (tech diff) EF 55-60%, grd I DD, vegetation on MV.  07/2021 EF 55-60% (s/p upgrade to BiV PPM), AV well seated.   VITRECTOMY       Social History:   reports that he has never smoked. He has never been exposed to tobacco smoke. He has never used smokeless tobacco. He reports that he does not drink alcohol  and does not use drugs.   Family History:  His family history includes Coronary artery disease in his mother; Heart attack in his mother; Hypertension in his mother; Neuropathy in his mother; Pulmonary fibrosis in his father.   Allergies Allergies  Allergen Reactions   Brimonidine Tartrate Shortness Of Breath    Alphagan-Shortness of breath   Brinzolamide Shortness Of Breath    AZOPT- Shortness of breath   Latanoprost Shortness Of Breath     XALATAN- Shortness of breath   Nucynta  [Tapentadol ] Shortness Of Breath   Sulfa Antibiotics Palpitations   Timolol Maleate Shortness Of Breath and Other (See Comments)    TIMOPTIC- Aggravated asthma   Diltiazem Swelling     leg swelling   Rofecoxib Swelling     VIOXX- leg swelling   Vancomycin Rash and Other (See Comments)    Blisters   Codeine Other (See Comments)    Childhood reaction   Tamsulosin Other (See Comments)    Dizziness    Celecoxib Other (See Comments)    CELLBREX-confusion   Colchicine Diarrhea  diarrhea   Tape Rash     Home Medications  Prior to Admission medications   Medication Sig Start Date End Date Taking? Authorizing Provider  albuterol  (VENTOLIN  HFA) 108 (90 Base) MCG/ACT inhaler TAKE 2 PUFFS BY MOUTH EVERY 6 HOURS AS NEEDED FOR WHEEZE OR SHORTNESS OF BREATH 03/04/24  Yes McGowen, Aleene DEL, MD  allopurinol  (ZYLOPRIM ) 300 MG tablet Take 1 tablet (300 mg total) by mouth daily. with food Patient taking differently: Take 300 mg by mouth every evening. 12/12/23  Yes McGowen, Aleene DEL, MD  amLODipine (NORVASC) 5 MG tablet Take 5 mg by mouth in the morning and at bedtime. 03/28/24 06/26/24 Yes [provider]  amoxicillin  (AMOXIL ) 500 MG capsule Take 2 capsules (1,000 mg total) by mouth 2 (two) times daily. 03/17/24  Yes Fleeta Rothman, Jomarie SAILOR, MD  apixaban  (ELIQUIS ) 5 MG TABS tablet Take 5 mg by mouth 2 (two) times daily.   Yes [provider]  ASPIRIN  LOW DOSE 81 MG EC tablet Take 81 mg by mouth daily. 02/03/19  Yes [provider]  B Complex-C (B-COMPLEX WITH VITAMIN C ) tablet Take 1 tablet by mouth daily. Take 1 tablet daily, 250mg    Yes [provider]  carvedilol (COREG) 3.125 MG tablet Take 3.125 mg by mouth 2 (two) times daily with a meal. 03/28/24 06/26/24 Yes [provider]  clotrimazole -betamethasone  (LOTRISONE ) cream Apply 1 Application topically daily. 03/28/24 03/28/25 Yes [provider]  ezetimibe  (ZETIA )  10 MG tablet Take 1 tablet (10 mg total) by mouth daily. 12/16/19  Yes McGowen, Aleene DEL, MD  ferrous gluconate  (FERGON) 324 MG tablet Take 1 tablet (324 mg total) by mouth 2 (two) times daily. 02/19/24  Yes McGowen, Aleene DEL, MD  FIBER PO Take 5 capsules by mouth in the morning and at bedtime.   Yes [provider]  finasteride  (PROSCAR ) 5 MG tablet Take 1 tablet (5 mg total) by mouth daily. 02/19/24  Yes McGowen, Aleene DEL, MD  fluticasone  (FLONASE ) 50 MCG/ACT nasal spray Place 2 sprays into both nostrils daily. Patient taking differently: Place 2 sprays into both nostrils daily as needed for allergies. 03/06/24  Yes McGowen, Aleene DEL, MD  fluticasone -salmeterol (WIXELA INHUB) 250-50 MCG/ACT AEPB Inhale 1 puff into the lungs 2 (two) times daily. in the morning and at bedtime. 12/12/23  Yes McGowen, Aleene DEL, MD  gabapentin  (NEURONTIN ) 800 MG tablet TAKE 1 TABLET BY MOUTH FOUR TIMES A DAY Patient taking differently: Take 800 mg by mouth 2 (two) times daily. 10/29/23  Yes McGowen, Aleene DEL, MD  loratadine (CLARITIN) 10 MG tablet Take 10 mg by mouth daily.   Yes [provider]  losartan  (COZAAR ) 25 MG tablet Take 25 mg by mouth daily. 03/29/24 06/27/24 Yes [provider]  mometasone -formoterol  (DULERA ) 100-5 MCG/ACT AERO Inhale 2 puffs into the lungs as needed for wheezing or shortness of breath.   Yes [provider]  Multiple Vitamin (MULTIVITAMIN ADULT PO) Take 2 tablets by mouth daily.   Yes [provider]  naloxone  (NARCAN ) nasal spray 4 mg/0.1 mL One spray in nostril as needed for opoid overdose.  May repeat dose in 5 min if needed. Patient taking differently: Place 0.4 mg into the nose as needed (OD). 06/08/23  Yes McGowen, Aleene DEL, MD  niacin  (NIASPAN ) 1000 MG CR tablet TAKE 1 TABLET BY MOUTH TWICE A DAY 03/17/24  Yes McGowen, Aleene DEL, MD  nitroGLYCERIN  (NITROSTAT ) 0.4 MG SL tablet Place 0.4 mg under the tongue every 5 (  five) minutes as needed for chest  pain. 09/04/17  Yes [provider]  Oxycodone  HCl 20 MG TABS 1 tab po bid prn severe pain Patient taking differently: Take 20 mg by mouth daily. 03/21/24  Yes McGowen, Aleene DEL, MD  pantoprazole  (PROTONIX ) 40 MG tablet Take 1 tablet (40 mg total) by mouth daily. Patient taking differently: Take 40 mg by mouth 2 (two) times daily. 09/01/22  Yes McGowen, Aleene DEL, MD  potassium chloride  SA (KLOR-CON  M) 20 MEQ tablet Take 1 tablet (20 mEq total) by mouth daily. Patient taking differently: Take 10 mEq by mouth 2 (two) times daily. 12/12/23  Yes McGowen, Aleene DEL, MD  ranolazine (RANEXA) 500 MG 12 hr tablet Take 500 mg by mouth 2 (two) times daily. 03/28/24 06/26/24 Yes [provider]  rosuvastatin  (CRESTOR ) 20 MG tablet Take 20 mg by mouth daily. 10/09/22  Yes [provider]  torsemide  (DEMADEX ) 20 MG tablet Take 1 tablet (20 mg total) by mouth 2 (two) times daily. Patient taking differently: Take 20 mg by mouth daily. 12/12/23  Yes McGowen, Aleene DEL, MD  VITAMIN D, CHOLECALCIFEROL, PO Take 1 tablet by mouth daily.    Yes [provider]  diclofenac  (VOLTAREN ) 75 MG EC tablet Take 1 tablet (75 mg total) by mouth 2 (two) times daily. Patient not taking: Reported on 04/10/2024 02/12/24   Valdemar Rocky SAUNDERS, NP     Critical care time:     The patient is critically ill due to shock.  Critical care was necessary to treat or prevent imminent or life-threatening deterioration.  Critical care was time spent personally by me on the following activities: development of treatment plan with patient and/or surrogate as well as nursing, discussions with consultants, evaluation of patient's response to treatment, examination of patient, obtaining history from patient or surrogate, ordering and performing treatments and interventions, ordering and review of laboratory studies, ordering and review of radiographic studies, pulse oximetry, re-evaluation of patient's condition and participation in  multidisciplinary rounds.   Critical Care Time devoted to patient care services described in this note is 75 minutes. This time reflects time of care of this signee Kadeja Granada S Tytan Sandate . This critical care time does not reflect separately billable procedures or procedure time, teaching time or supervisory time of PA/NP/Med student/Med Resident etc but could involve care discussion time.       Verdon GORMAN [facility] 04/10/2024 11:21 AM  Pager: see AMION  If no response to pager , please call critical care on call (see AMION) until 7pm After 7:00 pm call Elink

## 2024-04-10 NOTE — ED Provider Notes (Signed)
 Physical Exam  BP (!) 84/43   Pulse 73   Temp 98.1 F (36.7 C) (Oral)   Resp 14   Ht 6' 4 (1.93 m)   Wt 130 kg   SpO2 100%   BMI 34.89 kg/m   Physical Exam Vitals and nursing note reviewed.  Constitutional:      General: He is not in acute distress.    Appearance: He is well-developed.  HENT:     Head: Normocephalic and atraumatic.  Eyes:     Conjunctiva/sclera: Conjunctivae normal.  Cardiovascular:     Rate and Rhythm: Normal rate and regular rhythm.     Heart sounds: No murmur heard. Pulmonary:     Effort: Pulmonary effort is normal. No respiratory distress.     Breath sounds: Normal breath sounds.  Abdominal:     Palpations: Abdomen is soft.     Tenderness: There is no abdominal tenderness.  Musculoskeletal:        General: No swelling.     Cervical back: Neck supple.  Skin:    General: Skin is warm and dry.     Capillary Refill: Capillary refill takes less than 2 seconds.  Neurological:     Mental Status: He is alert.  Psychiatric:        Mood and Affect: Mood normal.     Procedures  Procedures  ED Course / MDM   Clinical Course as of 04/10/24 0709  Thu Apr 10, 2024  0608 I-stat chem 8, ED(!) Patient with significantly elevated creatinine at 3.3 and a BUN of 97 which is likely part of the somnolence and altered mental status with shaking.  Last echo on 20 September showed an EF of 55 to 60%.  He does have a history of ischemic cardiomyopathy but given recent echo results, we will give fluid given hypotension.  Renal failure may be related to recent addition of ARB and/or hypotension. [CH]    Clinical Course User Index [CH] Horton, Charmaine FALCON, MD   Medical Decision Making Amount and/or Complexity of Data Reviewed Labs: ordered. Decision-making details documented in ED Course. Radiology: ordered.  Risk Prescription drug management. Decision regarding hospitalization.    Received patient signout.  Patient was recently mated to outside hospital in  the setting of NSTEMI.  Was discharged on September 26.  Patient was started on 3 different blood pressure medications at that time.  Comes in today because of generalized weakness.  More confusion at home.  Currently, little concern for sepsis or infection.  Feel like most of the naps are related to his aggressive blood pressure control.  Has AKI as well.  Reviewed last echo, last EF 55 to 60%.  Patient received a liter of fluid here in the ED and plan on receiving a second liter of fluid as well as obtaining a CT head due to reported possible fall.  Will reassess after fluid resuscitation.  Initially when to go see the patient.  Patient is mentating well.  Systolics in the high 80s.  ANO x 3 with GCS 15.  Stated that he was feeling better than when he initially presented.  Reviewed blood pressure monitor notes that they were taken at home it looks like he has been running pretty hypotensive over the past couple of days to week or so.  Probably explains his AKI and ongoing hypotension.   After 1.5 L, patient was dropped his blood pressure little bit more.  Systolics in the high 70s.  Went to go see the  patient again.  Patient acting a little bit more sleepy and groggy.  Nonfocal exam.  Continue to have soft and benign abdomen.  No rebound guarding or tenderness.  Remained in room air.  I did add ceftriaxone  2 g to cover for any kind of UTI while we were waiting on this.  Also started on patient peripheral Levophed.  Maps improved.  Mentation improved as well.  Given need for peripheral pressors, patient will be admitted to ICU.  Still think this is likely the setting of polypharmacy but did cover with ceftriaxone  for any kind of UTI.  CRITICAL CARE Performed by: Lavonia LOISE Pat   Total critical care time: 45 minutes  Critical care time was exclusive of separately billable procedures and treating other patients.  Critical care was necessary to treat or prevent imminent or life-threatening  deterioration.  Critical care was time spent personally by me on the following activities: development of treatment plan with patient and/or surrogate as well as nursing, discussions with consultants, evaluation of patient's response to treatment, examination of patient, obtaining history from patient or surrogate, ordering and performing treatments and interventions, ordering and review of laboratory studies, ordering and review of radiographic studies, pulse oximetry and re-evaluation of patient's condition.      Pat Lavonia LOISE, MD 04/10/24 [phone]

## 2024-04-10 NOTE — ED Notes (Signed)
 Admitting at bedside

## 2024-04-10 NOTE — ED Provider Notes (Signed)
 [place] EMERGENCY DEPARTMENT AT [facility] Provider Note   CSN: [id number] Arrival date & time: 04/10/24  9487     Patient presents with: Laceration   Tim [name] is a 83 y.o. male.   HPI     This is an 83 year old male with a history of COPD, CHF, cardiomyopathy, diabetes, hypertension who presents from home with concern for generalized weakness.  EMS report is that patient has been having more confusion at home.  Also noted to have a laceration to the left leg.  Patient for me does not have any specific complaints.  He was recently admitted the hospital and states that he was placed on blood pressure medications and feels like his blood pressure may be low.  This has happened to him previously he stated.  He has an injury to the left lower leg.  He states that this started off as a blood blister.  Does not remember injuring himself.  He is on Eliquis .  5:59 AM Wife is now at the bedside.  States that he has been having increasing neuropathy since Sunday.  She has had to medicate him heavily at home for this.  He slept all day on Monday.  She states that he has had increasing confusion and has had difficulty getting up to the bedside commode which she normally is able to do independently.  He is only urinated once or twice daily which concerned her.  She states that he has been having some shaking episodes and difficulty coordinating his hand to his mouth when eating.  No known history of stroke.  Of note, he was placed on amlodipine, losartan , and a beta-blocker at discharge from recent hospitalization.  Prior to Admission medications   Medication Sig Start Date End Date Taking? Authorizing Provider  albuterol  (VENTOLIN  HFA) 108 (90 Base) MCG/ACT inhaler TAKE 2 PUFFS BY MOUTH EVERY 6 HOURS AS NEEDED FOR WHEEZE OR SHORTNESS OF BREATH 03/04/24   McGowen, Aleene DEL, MD  allopurinol  (ZYLOPRIM ) 300 MG tablet Take 1 tablet (300 mg total) by mouth daily. with food 12/12/23    McGowen, Aleene DEL, MD  amoxicillin  (AMOXIL ) 500 MG capsule Take 2 capsules (1,000 mg total) by mouth 2 (two) times daily. 03/17/24   Fleeta Kathie Jomarie LOISE, MD  apixaban  (ELIQUIS ) 5 MG TABS tablet Take 5 mg by mouth 2 (two) times daily.    [provider]  ASPIRIN  LOW DOSE 81 MG EC tablet Take 81 mg by mouth daily. 02/03/19   [provider]  B Complex-C (B-COMPLEX WITH VITAMIN C ) tablet Take 1 tablet by mouth daily. Take 1 tablet daily, 250mg     [provider]  diclofenac  (VOLTAREN ) 75 MG EC tablet Take 1 tablet (75 mg total) by mouth 2 (two) times daily. 02/12/24   Valdemar Rocky SAUNDERS, NP  ezetimibe  (ZETIA ) 10 MG tablet Take 1 tablet (10 mg total) by mouth daily. 12/16/19   McGowen, Aleene DEL, MD  ferrous gluconate  (FERGON) 324 MG tablet Take 1 tablet (324 mg total) by mouth 2 (two) times daily. 02/19/24   McGowen, Aleene DEL, MD  finasteride  (PROSCAR ) 5 MG tablet Take 1 tablet (5 mg total) by mouth daily. 02/19/24   McGowen, Aleene DEL, MD  fluticasone  (FLONASE ) 50 MCG/ACT nasal spray Place 2 sprays into both nostrils daily. 03/06/24   McGowen, Aleene DEL, MD  fluticasone -salmeterol (WIXELA INHUB) 250-50 MCG/ACT AEPB Inhale 1 puff into the lungs 2 (two) times daily. in the morning and at bedtime. 12/12/23  McGowen, Aleene DEL, MD  gabapentin  (NEURONTIN ) 800 MG tablet TAKE 1 TABLET BY MOUTH FOUR TIMES A DAY 10/29/23   McGowen, Aleene DEL, MD  Loratadine (CLARITIN PO) Take 1 tablet by mouth in the morning and at bedtime.    [provider]  mometasone -formoterol  (DULERA ) 100-5 MCG/ACT AERO Inhale 2 puffs into the lungs as needed for wheezing or shortness of breath.    [provider]  Multiple Vitamin (MULTIVITAMIN ADULT PO) Take 1 tablet by mouth daily.    [provider]  naloxone  (NARCAN ) nasal spray 4 mg/0.1 mL One spray in nostril as needed for opoid overdose.  May repeat dose in 5 min if needed. 06/08/23   McGowen, Aleene DEL, MD  niacin  (NIASPAN ) 1000 MG CR tablet TAKE  1 TABLET BY MOUTH TWICE A DAY 03/17/24   McGowen, Aleene DEL, MD  nitroGLYCERIN  (NITROSTAT ) 0.4 MG SL tablet Place 0.4 mg under the tongue every 5 (five) minutes as needed for chest pain. 09/04/17   [provider]  Oxycodone  HCl 20 MG TABS 1 tab po bid prn severe pain 03/21/24   McGowen, Aleene DEL, MD  pantoprazole  (PROTONIX ) 40 MG tablet Take 1 tablet (40 mg total) by mouth daily. Patient taking differently: Take 40 mg by mouth 2 (two) times daily. 09/01/22   McGowen, Aleene DEL, MD  potassium chloride  SA (KLOR-CON  M) 20 MEQ tablet Take 1 tablet (20 mEq total) by mouth daily. Patient not taking: Reported on 03/31/2024 12/12/23   McGowen, Philip H, MD  rosuvastatin  (CRESTOR ) 20 MG tablet Take 20 mg by mouth. 10/09/22   [provider]  torsemide  (DEMADEX ) 20 MG tablet Take 1 tablet (20 mg total) by mouth 2 (two) times daily. 12/12/23   McGowen, Aleene DEL, MD  vitamin C  (ASCORBIC ACID) 250 MG tablet Take 250 mg by mouth daily.    [provider]  VITAMIN D, CHOLECALCIFEROL, PO Take 1 tablet by mouth daily.     [provider]    Allergies: Brimonidine tartrate, Brinzolamide, Latanoprost, Nucynta  [tapentadol ], Sulfa antibiotics, Timolol maleate, Diltiazem, Rofecoxib, Vancomycin, Codeine, Tamsulosin, Celecoxib, Colchicine, and Tape    Review of Systems  Constitutional:  Negative for fever.  Respiratory:  Negative for shortness of breath.   Cardiovascular:  Negative for chest pain.  Gastrointestinal:  Negative for abdominal pain, nausea and vomiting.  Neurological:  Positive for weakness and numbness. Negative for dizziness.  All other systems reviewed and are negative.   Updated Vital Signs BP (!) 84/44   Pulse 71   Temp 98.1 F (36.7 C) (Oral)   Resp 14   Ht 1.93 m (6' 4)   Wt 130 kg   SpO2 99%   BMI 34.89 kg/m   Physical Exam Vitals and nursing note reviewed.  Constitutional:      Appearance: He is well-developed. He is obese.     Comments: Chronically  ill-appearing, no acute distress  HENT:     Head: Normocephalic and atraumatic.     Nose: Nose normal.     Mouth/Throat:     Mouth: Mucous membranes are moist.  Eyes:     Pupils: Pupils are equal, round, and reactive to light.  Cardiovascular:     Rate and Rhythm: Normal rate and regular rhythm.     Heart sounds: Normal heart sounds. No murmur heard. Pulmonary:     Effort: Pulmonary effort is normal. No respiratory distress.     Breath sounds: Normal breath sounds. No wheezing.  Abdominal:  Palpations: Abdomen is soft.     Tenderness: There is no abdominal tenderness. There is no rebound.     Comments: Rash noted under pannus  Musculoskeletal:     Cervical back: Neck supple.     Right lower leg: Edema present.     Left lower leg: Edema present.     Comments: Left midfoot amputation  Lymphadenopathy:     Cervical: No cervical adenopathy.  Skin:    General: Skin is warm and dry.     Comments: Superficial skin tear left lateral calf with oozing noted  Neurological:     Mental Status: He is alert and oriented to person, place, and time.     Comments: Appears to have some word finding difficulty at times and fluency is affected, slightly tremulous, strength grossly intact     (all labs ordered are listed, but only abnormal results are displayed) Labs Reviewed  CULTURE, BLOOD (ROUTINE X 2)  CULTURE, BLOOD (ROUTINE X 2)  CBC WITH DIFFERENTIAL/PLATELET  BRAIN NATRIURETIC PEPTIDE  COMPREHENSIVE METABOLIC PANEL WITH GFR  URINALYSIS, ROUTINE W REFLEX MICROSCOPIC  ETHANOL  RAPID URINE DRUG SCREEN, HOSP PERFORMED  APTT  PROTIME-INR  I-STAT CG4 LACTIC ACID, ED  I-STAT CHEM 8, ED    EKG: None  Radiology: No results found.   .Critical Care  Performed by: Bari Charmaine FALCON, MD Authorized by: Bari Charmaine FALCON, MD   Critical care provider statement:    Critical care time (minutes):  45   Critical care was necessary to treat or prevent imminent or life-threatening  deterioration of the following conditions:  Renal failure   Critical care was time spent personally by me on the following activities:  Development of treatment plan with patient or surrogate, discussions with consultants, evaluation of patient's response to treatment, examination of patient, ordering and review of laboratory studies, ordering and review of radiographic studies, ordering and performing treatments and interventions, pulse oximetry, re-evaluation of patient's condition and review of old charts    Medications Ordered in the ED - No data to display  Clinical Course as of 04/10/24 0609  Thu Apr 10, 2024  0608 I-stat chem 8, ED(!) Patient with significantly elevated creatinine at 3.3 and a BUN of 97 which is likely part of the somnolence and altered mental status with shaking.  Last echo on 20 September showed an EF of 55 to 60%.  He does have a history of ischemic cardiomyopathy but given recent echo results, we will give fluid given hypotension.  Renal failure may be related to recent addition of ARB and/or hypotension. [CH]    Clinical Course User Index [CH] Allee Busk, Charmaine FALCON, MD                                 Medical Decision Making Amount and/or Complexity of Data Reviewed Labs: ordered. Decision-making details documented in ED Course. Radiology: ordered.  Risk Prescription drug management. Decision regarding hospitalization.   This patient presents to the ED for concern of confusion, low blood pressure, decreased urination, this involves an extensive number of treatment options, and is a complaint that carries with it a high risk of complications and morbidity.  I considered the following differential and admission for this acute, potentially life threatening condition.  The differential diagnosis includes sepsis, metabolic derangement, CVA, renal dysfunction  MDM:    This is a 83 year old male who presents with confusion, low blood pressure, decreased urination.  He  is awake and alert.  He has no specific complaints.  Does have a small laceration or skin tear to the left lower leg.  He is technically oriented.  Wife states he has been more somnolent with decreased urine output.  Was just recently admitted for an NSTEMI.  Was placed on multiple blood pressure medications at that time.  Patient indicates that previously he has been taken off blood pressure medications because of episodes of hypotension.  He is afebrile.  Denies any infectious symptoms.  Sepsis workup was initiated but antibiotics were held as there is no obvious source and is not obviously septic.  White count is normal.  Lactate is 2.  Chemistry indicates acute kidney injury with a creatinine of 3.3.  Potassium 5.5.  Last EF was 55 to 60%.  He was given a liter of fluids with some marginal improvement of his blood pressure.  Question whether he may have some prerenal kidney failure related to low blood pressure.  Given that he has a history of bottoming out his pressures with blood pressure medications and was recently started on several, this could be the etiology.  He is also on losartan .  Chest x-ray is reassuring.  EKG is reassuring.  CT head is pending.  A second liter of fluids was ordered after he tolerated a full 1 L of fluids previously.  He will need admission.  After additional liter of fluids, depending on blood pressure reading, would admit to appropriate service.  At this time do not feel he needs IV pressors and will fluid resuscitate.  (Labs, imaging, consults)  Labs: I Ordered, and personally interpreted labs.  The pertinent results include: CBC CMP, blood cultures, lactate, urinalysis  Imaging Studies ordered: I ordered imaging studies including chest x-ray, CT head I independently visualized and interpreted imaging. I agree with the radiologist interpretation  Additional history obtained from chart review.  External records from outside source obtained and reviewed including prior  admission  Cardiac Monitoring: The patient was maintained on a cardiac monitor.  If on the cardiac monitor, I personally viewed and interpreted the cardiac monitored which showed an underlying rhythm of: Sinus  Reevaluation: After the interventions noted above, I reevaluated the patient and found that they have :stayed the same  Social Determinants of Health:  lives independently  Disposition: Admit, signed out to oncoming provider  Co morbidities that complicate the patient evaluation  Past Medical History:  Diagnosis Date   AICD (automatic cardioverter/defibrillator) present    Balanitis    +severe phimosis+ buried penis->circ not possible but dorsal slit done 10/2019   BPH (benign prostatic hypertrophy)    with urinary retention.  Renal u/s 12/04/19 NORMAL KIDNEYS, NORMAL BLADDER, NO HYDRONEPHROSIS   CAD (coronary artery disease)    Nonobstructive by 12/2012 cath; then 03/2016 he required BMS to RCA ([facility]).  In-stent restenosis on cath 11/01/16, baloon angioplasty successful.   CATARACT, HX OF    Chronic combined systolic and diastolic CHF (congestive heart failure) (HCC) 05/2016   Ischemic CM.  04/2018 EF 40-45%, grd I DD.  07/2021 EF 55-60%, AV well seated   Chronic pain syndrome    Lumbar DDD; chronic neuropathic pain (DM); has spinal stimulator and sees pain mgmt MD   Complicated UTI (urinary tract infection) 09/2019   Phimoses, acute urinary retention, entoerococcus UTI, enterococc bacteremia.  F/u blood clx's neg x 5d.     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  DM 2 dx by fasting gluc criteria 2018.  Has chronic neuropath pain   Enterococcal bacteremia 09/2019   Phimoses, acute urinary retention, entoerococcus UTI, enterococc bacteremia.  F/u blood clx's neg x 5d.     Essential hypertension, benign    Fatty liver 2007   2007 u/s showed fatty liver with  hepatosplenomegaly.  2019 repeat u/s->fatty liver but no cirrhosis or hepatosplenomegaly.   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 Fleeta Rothman)   Iron deficiency anemia    09/2022.  Hemoccults x 3 neg Mar 2024.  Improving with oral iron. GI following, no endoscopies   Lumbosacral neuritis    Lumbosacral spondylosis    Lumbar spinal stenosis with neurogenic claudication--contributes to his chronic pain syndrome   Morbid obesity (HCC)    Normal memory function 08/2014   Neuropsychological testing ([facility]): no cognitive impairment or sign of neurodegenerative disorder.  Likely has adjustment d/o with mixed anxiety/depressed features and may benefit from low dose SNRI.     Normocytic anemia 03/2016   Mild-pt needs ferritin and vit B12 level checked (as of 03/22/16). Hb stable 09/2019.  Hemoccult x 3 NEG 03/2022.  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 chronic?) Off anticoag for a while due to falls.  Then apixaban  started 12/2014.   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 Clorinda): neuropathic pain->diff to treat, failed nucynta , failed spinal stimul trial, oxycontin  hs + tramadol  + gabap as of 12/2017 f/u Dr. Carilyn.   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   Sick sinus syndrome (HCC)    PPM placed   Thrombocytopenia 2018   HSM on 2007 abd u/s---suspect some mild splenic sequestration chronically.   Unspecified glaucoma(365.9)    Unspecified hereditary and idiopathic peripheral neuropathy approx age 42   bilat LE's, ? left arm, too.  Feet became progressively numb + left foot pain intermittently.  Pt may be trying a spinal stimulator (as of 05/2015)  Vaccine counseling 11/16/2021   VENOUS INSUFFICIENCY    Being followed by Dr. Harden as of 10/2016 for two R LL venous stasis ulcers/skin tears.  Healed as of 10/30/16 f/u with Dr. Harden.   Venous stasis dermatitis 12/27/2021   VENTRAL HERNIA      Medicines Meds ordered this encounter  Medications   sodium chloride  0.9 % bolus 1,000 mL   fentaNYL  (SUBLIMAZE ) injection 50 mcg   sodium chloride  0.9 % bolus 1,000 mL   cefTRIAXone  (ROCEPHIN ) 2 g in sodium chloride  0.9 % 100 mL IVPB    Antibiotic Indication::   UTI   0.9 %  sodium chloride  infusion   norepinephrine (LEVOPHED) 4mg  in 250mL (0.016 mg/mL) premix infusion    IV Access:   Peripheral   Chlorhexidine  Gluconate Cloth 2 % PADS 6 each   docusate sodium  (COLACE) capsule 100 mg   polyethylene glycol (MIRALAX / GLYCOLAX) packet 17 g   lactated ringers  bolus 1,000 mL   sodium zirconium cyclosilicate (LOKELMA) packet 10 g    piperacillin -tazobactam (ZOSYN ) IVPB 3.375 g    Antibiotic Indication::   Sepsis   piperacillin -tazobactam (ZOSYN ) IVPB 3.375 g    Antibiotic Indication::   Sepsis   nystatin  (MYCOSTATIN /NYSTOP ) topical powder   linezolid  (ZYVOX ) IVPB 600 mg    Antibiotic Indication::   Other Indication (list below)    Other Indication::   Hx enterococcus bacteremia   apixaban  (ELIQUIS ) tablet 2.5 mg   aspirin  chewable tablet 81 mg   fentaNYL  (SUBLIMAZE ) injection 12.5 mcg   perflutren  lipid microspheres (DEFINITY ) IV suspension   Oral care mouth rinse   lidocaine  (LIDODERM ) 5 % 1 patch   acetaminophen  (TYLENOL ) tablet 650 mg   fentaNYL  (SUBLIMAZE ) injection 12.5 mcg    I have reviewed the patients home medicines and have made adjustments as needed  Problem List / ED Course: Problem List Items Addressed This Visit   None Visit Diagnoses       AKI (acute kidney injury)    -  Primary     Hypotension, unspecified hypotension type       Relevant Medications   norepinephrine (LEVOPHED) 4mg  in (0.016 mg/mL) premix infusion   amLODipine (NORVASC) 5 MG tablet   carvedilol (COREG) 3.125 MG tablet   losartan  (COZAAR ) 25 MG tablet   ranolazine (RANEXA) 500 MG 12 hr tablet   apixaban  (ELIQUIS ) tablet 2.5 mg   aspirin  chewable tablet 81 mg     Confusion         Uremia                    Final diagnoses:  None    ED Discharge Orders     None          Bari Charmaine FALCON, MD 04/10/24 2352

## 2024-04-10 NOTE — Progress Notes (Signed)
 eLink Physician-Brief Progress Note Patient Name: Tim [name] DOB: June 10, 1941 MRN: [id number]   Date of Service  04/10/2024  HPI/Events of Note  Patient presents with septic versus hypovolemic shock on low-dose norepinephrine 6-9 mics  No anticipated procedures  Passed swallow eval, has PMNs, requesting advance of diet  eICU Interventions  Advance to clear liquid diet, can reassess full diet in the morning   0256 -patient has chronic lower extremity neuropathy.  States that it is 8 out of 10 and normally takes gabapentin /OxyContin  at home.  Has an AKI.  Additional dose of fentanyl , fentanyl  as needed already in place.  9562 -having some nausea in addition to persistent pain.  Will increase the dosage of fentanyl  to 25 mcg.  Extra dose now.  And Zofran .  Intervention Category Minor Interventions: Routine modifications to care plan (e.g. PRN medications for pain, fever)  Jaeveon Ashland 04/10/2024, 10:25 PM

## 2024-04-10 NOTE — ED Triage Notes (Signed)
 Pt BIB [facility] from home. Pt has a left lower leg laceration. Pt's wife reports he caught his leg on a chair at home. Pt did not fall and is on blood thinners, eliquis . Bleeding controlled at this time. Pt does not remember hitting his leg, pt notes he has been having more confusion since being discharged. Hx COPD. GCS 14  EMS 97.1T axillary 130cbg 108/39 BP 103P 95% RA

## 2024-04-11 ENCOUNTER — Encounter: Payer: Self-pay | Admitting: Family Medicine

## 2024-04-11 DIAGNOSIS — N179 Acute kidney failure, unspecified: Secondary | ICD-10-CM

## 2024-04-11 DIAGNOSIS — T82857A Stenosis of cardiac prosthetic devices, implants and grafts, initial encounter: Secondary | ICD-10-CM | POA: Diagnosis not present

## 2024-04-11 DIAGNOSIS — I058 Other rheumatic mitral valve diseases: Secondary | ICD-10-CM

## 2024-04-11 DIAGNOSIS — I480 Paroxysmal atrial fibrillation: Secondary | ICD-10-CM

## 2024-04-11 DIAGNOSIS — R579 Shock, unspecified: Secondary | ICD-10-CM | POA: Diagnosis not present

## 2024-04-11 DIAGNOSIS — I2510 Atherosclerotic heart disease of native coronary artery without angina pectoris: Secondary | ICD-10-CM

## 2024-04-11 LAB — CBC
HCT: 33.3 % — ABNORMAL LOW (ref 39.0–52.0)
Hemoglobin: 11.7 g/dL — ABNORMAL LOW (ref 13.0–17.0)
MCH: 32.9 pg (ref 26.0–34.0)
MCHC: 35.1 g/dL (ref 30.0–36.0)
MCV: 93.5 fL (ref 80.0–100.0)
Platelets: 148 K/uL — ABNORMAL LOW (ref 150–400)
RBC: 3.56 MIL/uL — ABNORMAL LOW (ref 4.22–5.81)
RDW: 14.2 % (ref 11.5–15.5)
WBC: 10.2 K/uL (ref 4.0–10.5)
nRBC: 0 % (ref 0.0–0.2)

## 2024-04-11 LAB — BASIC METABOLIC PANEL WITH GFR
Anion gap: 15 (ref 5–15)
BUN: 63 mg/dL — ABNORMAL HIGH (ref 8–23)
CO2: 23 mmol/L (ref 22–32)
Calcium: 8.7 mg/dL — ABNORMAL LOW (ref 8.9–10.3)
Chloride: 95 mmol/L — ABNORMAL LOW (ref 98–111)
Creatinine, Ser: 2.23 mg/dL — ABNORMAL HIGH (ref 0.61–1.24)
GFR, Estimated: 28 mL/min — ABNORMAL LOW (ref >60)
Glucose, Bld: 173 mg/dL — ABNORMAL HIGH (ref 70–99)
Potassium: 3.8 mmol/L (ref 3.5–5.1)
Sodium: 133 mmol/L — ABNORMAL LOW (ref 135–145)

## 2024-04-11 LAB — GLUCOSE, CAPILLARY
Glucose-Capillary: 162 mg/dL — ABNORMAL HIGH (ref 70–99)
Glucose-Capillary: 170 mg/dL — ABNORMAL HIGH (ref 70–99)
Glucose-Capillary: 207 mg/dL — ABNORMAL HIGH (ref 70–99)
Glucose-Capillary: 162 mg/dL — ABNORMAL HIGH (ref 70–99)
Glucose-Capillary: 183 mg/dL — ABNORMAL HIGH (ref 70–99)

## 2024-04-11 LAB — PHOSPHORUS: Phosphorus: 3.8 mg/dL (ref 2.5–4.6)

## 2024-04-11 LAB — MAGNESIUM: Magnesium: 1.6 mg/dL — ABNORMAL LOW (ref 1.7–2.4)

## 2024-04-11 MED ORDER — ASPIRIN 81 MG PO CHEW
81.0000 mg | CHEWABLE_TABLET | Freq: Every day | ORAL | Status: DC
  Administered 2024-04-11: 81 mg via ORAL
  Filled 2024-04-11: qty 1

## 2024-04-11 MED ORDER — SODIUM CHLORIDE 0.9 % IV SOLN
2.0000 g | INTRAVENOUS | Status: DC
  Administered 2024-04-11 – 2024-04-16 (×6): 2 g via INTRAVENOUS
  Filled 2024-04-11 (×6): qty 20

## 2024-04-11 MED ORDER — ONDANSETRON HCL 4 MG/2ML IJ SOLN
4.0000 mg | Freq: Four times a day (QID) | INTRAMUSCULAR | Status: AC | PRN
  Administered 2024-04-11 – 2024-04-13 (×3): 4 mg via INTRAVENOUS
  Filled 2024-04-11 (×3): qty 2

## 2024-04-11 MED ORDER — SODIUM CHLORIDE 0.9 % IV SOLN
10.0000 mg/kg | Freq: Every day | INTRAVENOUS | Status: DC
  Filled 2024-04-11: qty 20

## 2024-04-11 MED ORDER — FENTANYL CITRATE (PF) 50 MCG/ML IJ SOSY
25.0000 ug | PREFILLED_SYRINGE | Freq: Once | INTRAMUSCULAR | Status: AC
  Administered 2024-04-11: 25 ug via INTRAVENOUS
  Filled 2024-04-11: qty 1

## 2024-04-11 MED ORDER — INSULIN ASPART 100 UNIT/ML IJ SOLN
0.0000 [IU] | Freq: Three times a day (TID) | INTRAMUSCULAR | Status: DC
  Administered 2024-04-11: 3 [IU] via SUBCUTANEOUS
  Administered 2024-04-11: 2 [IU] via SUBCUTANEOUS
  Administered 2024-04-12 – 2024-04-17 (×9): 1 [IU] via SUBCUTANEOUS

## 2024-04-11 MED ORDER — SODIUM CHLORIDE 0.9 % IV SOLN
8.0000 mg/kg | Freq: Every day | INTRAVENOUS | Status: DC
  Filled 2024-04-11: qty 16

## 2024-04-11 MED ORDER — GABAPENTIN 100 MG PO CAPS
100.0000 mg | ORAL_CAPSULE | Freq: Three times a day (TID) | ORAL | Status: DC
  Administered 2024-04-11 – 2024-04-12 (×6): 100 mg via ORAL
  Filled 2024-04-11 (×6): qty 1

## 2024-04-11 MED ORDER — FENTANYL CITRATE (PF) 50 MCG/ML IJ SOSY
12.5000 ug | PREFILLED_SYRINGE | Freq: Once | INTRAMUSCULAR | Status: AC
  Administered 2024-04-11: 12.5 ug via INTRAVENOUS
  Filled 2024-04-11: qty 1

## 2024-04-11 MED ORDER — APIXABAN 2.5 MG PO TABS
2.5000 mg | ORAL_TABLET | Freq: Two times a day (BID) | ORAL | Status: DC
  Administered 2024-04-12: 2.5 mg via ORAL
  Filled 2024-04-11: qty 1

## 2024-04-11 MED ORDER — INSULIN ASPART 100 UNIT/ML IJ SOLN: 0.0000 [IU] | Freq: Every day | INTRAMUSCULAR | Status: DC

## 2024-04-11 MED ORDER — FENTANYL CITRATE (PF) 50 MCG/ML IJ SOSY
25.0000 ug | PREFILLED_SYRINGE | INTRAMUSCULAR | Status: DC | PRN
  Administered 2024-04-13 – 2024-04-15 (×6): 25 ug via INTRAVENOUS
  Filled 2024-04-11 (×6): qty 1

## 2024-04-11 MED ORDER — MAGNESIUM SULFATE 2 GM/50ML IV SOLN
2.0000 g | Freq: Once | INTRAVENOUS | Status: AC
  Administered 2024-04-11: 2 g via INTRAVENOUS
  Filled 2024-04-11: qty 50

## 2024-04-11 MED ORDER — LINEZOLID 600 MG/300ML IV SOLN
600.0000 mg | Freq: Two times a day (BID) | INTRAVENOUS | Status: DC
  Administered 2024-04-11 – 2024-04-16 (×10): 600 mg via INTRAVENOUS
  Filled 2024-04-11 (×11): qty 300

## 2024-04-11 NOTE — Consult Note (Signed)
 Cardiology Consultation   Patient ID: HAIDAN NHAN MRN: [id number]; DOB: 1941/03/30  Admit date: 04/10/2024 Date of Consult: 04/11/2024  PCP:  [name], MD   [facility] Providers Cardiologist:  [facility]  Patient Profile: Tim [name] is a 83 y.o. 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 Gatha.  History of Present Illness: [name] past medical history as listed above.  He presented to [name] emergency department on 04/10/2024 with generalized weakness, confusion, laceration to the left leg.  Once his wife arrived to the emergency department she reported that he began having increasing neuropathy since Sunday prior to arrival.  His wife reports that he had been more confused, having decreased urine output, and had been rather somnolent, notes that he was sleeping all day Monday.  He was just recently admitted to [facility] from 9/19 to 03/28/2024 for NSTEMI.  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  He had a PPM placed in 2017 which was recently upgraded to a BiV ICD in 2019 for nonischemic cardiomyopathy.  His most recent LVEF is 55%.  During his admission to [name] he underwent a cardiac catheterization which showed: Ostial diagonal disease progressed, noted to be a small branch vessel and not amendable to PCI without jeopardizing the LAD, recommended medical therapy with ASA, Eliquis ,  statin, addition of Ranexa.  The following medications he was discharged on include: Amlodipine 5 mg BID, carvedilol 3.125 mg BID, losartan  25 mg daily, Ranexa 500 mg BID, Eliquis  5 mg BID, Zetia  10 mg daily, Crestor  20 mg daily, torsemide  20 mg PRN.  Of note when he was discharged home his BP was 168/74.  On the ED, BP was as low as 55/45, HR remained 60 to 90s, SpO2 98 to 100% on room air, afebrile.  Relevant workup since admission includes: BMP shows hyponatremia, creatinine elevated to 3.12 (from 0.713 weeks prior) trending down to 2.23 today, troponin negative, BNP normal, CBC shows stable chronic anemia, stable chronic thrombocytopenia.  Echocardiogram this admission showed: LVEF 65 to 70%, mild LVH, normal systolic RV function, moderately dilated LA, possible vegetation vs mobile calcium  on anterior leaflet of mitral valve, mild MS, severe MAC, severe calcification of aortic valve, severe AS, normal IVC.  He was admitted to the ICU for septic vs hypovolemic shock.  Cardiology was consulted in the setting of history of endocarditis, vegetation noted on TTE, history of atrial fibrillation, history of aortic stenosis s/p TAVR 2017, CHF s/p BiV ICD.  After speaking with the patient and his wife, he is doing much better today when compared to yesterday. The patient tells me that he does not remember much but his wife agrees with the history as above. She tells me that after leaving [facility] he was doing well and had no issues. She tells me that at discharge he was started on four new medications: amlodipine 5 mg BID, carvedilol 3.125 mg BID, losartan  25 mg daily, Ranexa 500 mg BID. She thinks  it is possible that all these new medications are what caused his BP to drop and his confusion to set in.   She tells me he follows regularly with ID, he was last seen 03/2024, prior to his admission at [facility]. They discussed that he was going to stay on his amoxacillin therapy for life for his endocarditis.   Patient is  able to tell me that he feels much better than he did yesterday. His external cath shows frank blood in the canister. He tells me that overall he is feeling good. His BP has been stable. Currently on Levophed at 5 mcg/hr, weaning as tolerated.   Past Medical History:  Diagnosis Date   AICD (automatic cardioverter/defibrillator) present    Balanitis    +severe phimosis+ buried penis->circ not possible but dorsal slit done 10/2019   BPH (benign prostatic hypertrophy)    with urinary retention.  Renal u/s 12/04/19 NORMAL KIDNEYS, NORMAL BLADDER, NO HYDRONEPHROSIS   CAD (coronary artery disease)    Nonobstructive by 12/2012 cath; then 03/2016 he required BMS to RCA ([facility]).  In-stent restenosis on cath 11/01/16, baloon angioplasty successful.   CATARACT, HX OF    Chronic combined systolic and diastolic CHF (congestive heart failure) (HCC) 05/2016   Ischemic CM.  04/2018 EF 40-45%, grd I DD.  07/2021 EF 55-60%, AV well seated   Chronic pain syndrome    Lumbar DDD; chronic neuropathic pain (DM); has spinal stimulator and sees pain mgmt MD   Complicated UTI (urinary tract infection) 09/2019   Phimoses, acute urinary retention, entoerococcus UTI, enterococc bacteremia.  F/u blood clx's neg x 5d.     COPD    Debilitated patient    Diabetes mellitus type 2 with complications (HCC)    HbA1c jumped from 5.7% to 6.1% 03/2015---started metformin  at that time.  DM 2 dx by fasting gluc criteria 2018.  Has chronic neuropath pain   Enterococcal bacteremia 09/2019   Phimoses, acute urinary retention, entoerococcus UTI, enterococc bacteremia.  F/u blood clx's neg x 5d.     Essential hypertension, benign    Fatty liver 2007   2007 u/s showed fatty liver with hepatosplenomegaly.  2019 repeat u/s->fatty liver but no cirrhosis or hepatosplenomegaly.   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 Fleeta Rothman)   Iron deficiency anemia    09/2022.  Hemoccults x 3 neg Mar 2024.  Improving with oral iron. GI following, no endoscopies   Lumbosacral neuritis    Lumbosacral spondylosis    Lumbar spinal stenosis with neurogenic claudication--contributes to his chronic pain syndrome   Morbid obesity (HCC)    Normal memory function 08/2014   Neuropsychological testing ([facility]): no cognitive impairment or sign of neurodegenerative disorder.  Likely has adjustment d/o with mixed anxiety/depressed features and may benefit from low dose SNRI.     Normocytic anemia 03/2016   Mild-pt needs ferritin and vit B12 level checked (as of 03/22/16). Hb stable 09/2019.  Hemoccult x 3 NEG 03/2022.  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 chronic?) Off anticoag for a while due to falls.  Then apixaban  started 12/2014.   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 Clorinda): neuropathic pain->diff to treat, failed nucynta , failed spinal stimul trial, oxycontin  hs + tramadol  + gabap as of 12/2017 f/u Dr. Carilyn.   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   Sick sinus syndrome (HCC)    PPM placed   Thrombocytopenia 2018   HSM on 2007 abd u/s---suspect some mild splenic sequestration chronically.   Unspecified glaucoma(365.9)    Unspecified hereditary and idiopathic peripheral neuropathy approx age 67   bilat LE's, ? left arm, too.  Feet became progressively numb + left foot pain intermittently.  Pt may be trying a spinal stimulator (as of 05/2015)   Vaccine counseling 11/16/2021   VENOUS INSUFFICIENCY    Being followed by Dr. Harden as of 10/2016 for two R LL venous stasis ulcers/skin tears.  Healed as of 10/30/16 f/u with Dr. Harden.   Venous stasis dermatitis 12/27/2021   VENTRAL HERNIA    Past Surgical History:  Procedure Laterality Date   AMPUTATION Left 04/11/2013   Procedure: AMPUTATION DIGIT Left 3rd toe;  Surgeon: Jerona LULLA Harden, MD;  Location: [facility] OR;  Service: Orthopedics;  Laterality: Left;  Left 3rd toe amputation at [facility]   AMPUTATION Left 08/29/2019   Procedure: LEFT TRANSMETATARSAL AMPUTATION;  Surgeon: Harden Jerona LULLA, MD;  Location: [facility] OR;  Service: Orthopedics;  Laterality: Left;   BIOPSY  05/04/2020   Procedure: BIOPSY;  Surgeon: Charlanne Groom, MD;  Location: [facility] ENDOSCOPY;  Service: Endoscopy;;   CARDIAC CATHETERIZATION  1997; 03/10/16   1997 Non-obstructive disease.  03/2016 BMS to RCA, with 25% pDiag dz, o/w normal cors per cath 03/07/16.  Cath 11/01/16: in stent restenosis, successful baloon angioplasty. 08/2020 branch vessel dz, cont medical therapy rec'd.   CARDIAC CATHETERIZATION  12/24/2012   mild < 20% LCx, prox 30% RCA; LVEF 55-65% , moderate  pulmonary HTN, moderate AS   CARDIAC DEFIBRILLATOR PLACEMENT  11/07/2017   Claria MRI Quad CRT defibrillator   CARDIOVASCULAR STRESS TEST  05/11/16 ([facility])   2017 Myocardial perfusion imaging:  No ischemia; scar in apex, global hypokinesis, EF 36%.  06/15/20->mod primarily fixed inferolat wall defect mildly worse with stress c/w infarct/scar with mild peri-infarct ischemia, normal EF-->for cath per cards.   Carotid dopplers  03/09/2016   [facility]: no hemodynamically significant stenosis on either side.   CHOLECYSTECTOMY     COLONOSCOPY [name] 10/30/2012   Procedure: COLONOSCOPY;  Surgeon: Lupita FORBES Commander, MD;  Location: [facility] ENDOSCOPY;  Service: Endoscopy;  Laterality: [name];   CORONARY ANGIOPLASTY WITH STENT PLACEMENT  03/2016; 04/2017   2017-[facility]: BMS to RCA-pt was placed on Brilinta.  04/2017: DES to RCA.   DORSAL SLIT [name] 10/29/2019   for severe phimosis. Procedure: DORSAL SLIT;  Surgeon: Alvaro Hummer, MD;  Location: [facility] ORS;  Service: Urology;  Laterality: [name];  45 MINS   ESOPHAGEAL DILATION  05/04/2020   Procedure: ESOPHAGEAL DILATION;  Surgeon: Charlanne Groom, MD;  Location: [facility] ENDOSCOPY;  Service: Endoscopy;;   ESOPHAGOGASTRODUODENOSCOPY (EGD) WITH PROPOFOL  [name] 05/04/2020   Procedure: ESOPHAGOGASTRODUODENOSCOPY (EGD) WITH PROPOFOL ;  Surgeon: Charlanne Groom, MD;  Location: [facility] ENDOSCOPY;  Service: Endoscopy;  Laterality: [name];   EYE SURGERY Bilateral cataract   HEMORRHOID SURGERY     INTRAOCULAR LENS INSERTION Bilateral    KNEE SURGERY Right    LEFT AND RIGHT HEART CATHETERIZATION WITH CORONARY ANGIOGRAM [name] 12/24/2012   Procedure: LEFT AND RIGHT HEART CATHETERIZATION WITH CORONARY ANGIOGRAM;  Surgeon: Peter M Swaziland, MD;  Location: [facility] CATH LAB;  Service: Cardiovascular;  Laterality: [name];   LEG SURGERY Bilateral    lenghtening    PACEMAKER PLACEMENT  04/13/2016   2nd deg HB after TAVR, pt had DC MDT PPM placed.   SHOULDER ARTHROSCOPY  08/30/2011   Procedure: ARTHROSCOPY SHOULDER;  Surgeon:  Jerona LULLA Sage, MD;  Location: [facility] OR;  Service: Orthopedics;  Laterality: Right;  Right Shoulder Arthroscopy, Debridement, and Decompression   SPINAL CORD STIMULATOR INSERTION [name] 09/10/2015   Procedure: LUMBAR SPINAL CORD STIMULATOR INSERTION;  Surgeon: Deward Fabian, MD;  Location: [facility] NEURO ORS;  Service: Neurosurgery;  Laterality: [name];   TEE WITHOUT CARDIOVERSION [name] 09/16/2019   Procedure: TRANSESOPHAGEAL ECHOCARDIOGRAM (TEE);  Surgeon: Raford Riggs, MD;  Location: [name] ENDOSCOPY;  Service: Cardiovascular;  Laterality: [name];   TEE WITHOUT CARDIOVERSION [name] 12/02/2019   +vegetation on AVR and pacer lead in RV.  EF 55-60%, normal wall motion.  Valves function normal.  Procedure: TRANSESOPHAGEAL ECHOCARDIOGRAM (TEE);  Surgeon: Barbaraann Darryle Ned, MD;  Location: [facility] ENDOSCOPY;  Service: Cardiovascular;  Laterality: [name];   TOE AMPUTATION Left    due to osteomyelitis.  R big toe surg due to osteoarth   TONSILLECTOMY     traeculectomy Left    eye   TRANSCATHETER AORTIC VALVE REPLACEMENT, TRANSFEMORAL  04/11/2016   TRANSESOPHAGEAL ECHOCARDIOGRAM  03/09/2016; 09/2019   [facility]: EF 55-60%, PFO seen with bi-directional shunting, no thrombus in appendage.  09/2019 ->no valvular vegetations. Small patent foramen ovale with predominantly left to right shunting across the interatrial septum.   TRANSTHORACIC ECHOCARDIOGRAM  01/2015; 01/2016; 05/18/16; 09/18/16, 05/2017, 08/2017   01/2015 No signif change in aortic stenosis (moderate).  01/2016 Severe LVH w/small LV cavity, EF 60-65%, grade I diast dysfxn.  05/2016 (s/p TAVR): EF 50-55%, grd I DD, biopros AV good.  08/2016--EF 50-55%, LV septal motion c/w conduction abnl, grd I DD,mild MS,bioprosth aortic valve well seated, w/trace AR. 05/2017 TTE EF 35%. 08/2017-EF 35%, mod diff hypokin LV, grd I DD, biopros AV good.    TRANSTHORACIC ECHOCARDIOGRAM  04/2018; 09/2019   04/2018: EF 40-45%, mod diffuse LV hypokin, grd I DD, bioprosth AV well seated, no AS or AR. 09/2019 EF  60-65%, grd I DD, valves fine, including bioprosth AV. 04/29/20 (tech diff) EF 55-60%, grd I DD, vegetation on MV.  07/2021 EF 55-60% (s/p upgrade to BiV PPM), AV well seated.   VITRECTOMY      Home Medications:  Prior to Admission medications   Medication Sig  Start Date End Date Taking? Authorizing Provider  albuterol  (VENTOLIN  HFA) 108 (90 Base) MCG/ACT inhaler TAKE 2 PUFFS BY MOUTH EVERY 6 HOURS AS NEEDED FOR WHEEZE OR SHORTNESS OF BREATH 03/04/24  Yes McGowen, Aleene DEL, MD  allopurinol  (ZYLOPRIM ) 300 MG tablet Take 1 tablet (300 mg total) by mouth daily. with food Patient taking differently: Take 300 mg by mouth every evening. 12/12/23  Yes McGowen, Aleene DEL, MD  amLODipine (NORVASC) 5 MG tablet Take 5 mg by mouth in the morning and at bedtime. 03/28/24 06/26/24 Yes [provider]  amoxicillin  (AMOXIL ) 500 MG capsule Take 2 capsules (1,000 mg total) by mouth 2 (two) times daily. 03/17/24  Yes Fleeta Rothman, Jomarie SAILOR, MD  apixaban  (ELIQUIS ) 5 MG TABS tablet Take 5 mg by mouth 2 (two) times daily.   Yes [provider]  ASPIRIN  LOW DOSE 81 MG EC tablet Take 81 mg by mouth daily. 02/03/19  Yes [provider]  B Complex-C (B-COMPLEX WITH VITAMIN C ) tablet Take 1 tablet by mouth daily. Take 1 tablet daily, 250mg    Yes [provider]  carvedilol (COREG) 3.125 MG tablet Take 3.125 mg by mouth 2 (two) times daily with a meal. 03/28/24 06/26/24 Yes [provider]  clotrimazole -betamethasone  (LOTRISONE ) cream Apply 1 Application topically daily. 03/28/24 03/28/25 Yes [provider]  ezetimibe  (ZETIA ) 10 MG tablet Take 1 tablet (10 mg total) by mouth daily. 12/16/19  Yes McGowen, Aleene DEL, MD  ferrous gluconate  (FERGON) 324 MG tablet Take 1 tablet (324 mg total) by mouth 2 (two) times daily. 02/19/24  Yes McGowen, Aleene DEL, MD  FIBER PO Take 5 capsules by mouth in the morning and at bedtime.   Yes [provider]  finasteride  (PROSCAR ) 5 MG tablet Take  1 tablet (5 mg total) by mouth daily. 02/19/24  Yes McGowen, Aleene DEL, MD  fluticasone  (FLONASE ) 50 MCG/ACT nasal spray Place 2 sprays into both nostrils daily. Patient taking differently: Place 2 sprays into both nostrils daily as needed for allergies. 03/06/24  Yes McGowen, Aleene DEL, MD  fluticasone -salmeterol (WIXELA INHUB) 250-50 MCG/ACT AEPB Inhale 1 puff into the lungs 2 (two) times daily. in the morning and at bedtime. 12/12/23  Yes McGowen, Aleene DEL, MD  gabapentin  (NEURONTIN ) 800 MG tablet TAKE 1 TABLET BY MOUTH FOUR TIMES A DAY Patient taking differently: Take 800 mg by mouth 2 (two) times daily. 10/29/23  Yes McGowen, Aleene DEL, MD  loratadine (CLARITIN) 10 MG tablet Take 10 mg by mouth daily.   Yes [provider]  losartan  (COZAAR ) 25 MG tablet Take 25 mg by mouth daily. 03/29/24 06/27/24 Yes [provider]  mometasone -formoterol  (DULERA ) 100-5 MCG/ACT AERO Inhale 2 puffs into the lungs as needed for wheezing or shortness of breath.   Yes [provider]  Multiple Vitamin (MULTIVITAMIN ADULT PO) Take 2 tablets by mouth daily.   Yes [provider]  naloxone  (NARCAN ) nasal spray 4 mg/0.1 mL One spray in nostril as needed for opoid overdose.  May repeat dose in 5 min if needed. Patient taking differently: Place 0.4 mg into the nose as needed (OD). 06/08/23  Yes McGowen, Aleene DEL, MD  niacin  (NIASPAN ) 1000 MG CR tablet TAKE 1 TABLET BY MOUTH TWICE A DAY 03/17/24  Yes McGowen, Aleene DEL, MD  nitroGLYCERIN  (NITROSTAT ) 0.4 MG SL tablet Place 0.4 mg under the tongue every 5 (five) minutes as needed for chest pain. 09/04/17  Yes [provider]  Oxycodone  HCl 20 MG TABS 1  tab po bid prn severe pain Patient taking differently: Take 20 mg by mouth daily. 03/21/24  Yes McGowen, Aleene DEL, MD  pantoprazole  (PROTONIX ) 40 MG tablet Take 1 tablet (40 mg total) by mouth daily. Patient taking differently: Take 40 mg by mouth 2 (two) times daily. 09/01/22  Yes McGowen,  Aleene DEL, MD  potassium chloride  SA (KLOR-CON  M) 20 MEQ tablet Take 1 tablet (20 mEq total) by mouth daily. Patient taking differently: Take 10 mEq by mouth 2 (two) times daily. 12/12/23  Yes McGowen, Aleene DEL, MD  ranolazine (RANEXA) 500 MG 12 hr tablet Take 500 mg by mouth 2 (two) times daily. 03/28/24 06/26/24 Yes [provider]  rosuvastatin  (CRESTOR ) 20 MG tablet Take 20 mg by mouth daily. 10/09/22  Yes [provider]  torsemide  (DEMADEX ) 20 MG tablet Take 1 tablet (20 mg total) by mouth 2 (two) times daily. Patient taking differently: Take 20 mg by mouth daily. 12/12/23  Yes McGowen, Aleene DEL, MD  VITAMIN D, CHOLECALCIFEROL, PO Take 1 tablet by mouth daily.    Yes [provider]  diclofenac  (VOLTAREN ) 75 MG EC tablet Take 1 tablet (75 mg total) by mouth 2 (two) times daily. Patient not taking: Reported on 04/10/2024 02/12/24   Zamora, Erin R, NP   Scheduled Meds:  [START ON 04/12/2024] apixaban   2.5 mg Oral BID   aspirin   81 mg Oral Daily   Chlorhexidine  Gluconate Cloth  6 each Topical Daily   gabapentin   100 mg Oral TID   insulin  aspart  0-5 Units Subcutaneous QHS   insulin  aspart  0-9 Units Subcutaneous TID WC   lidocaine   1 patch Transdermal Q24H   nystatin    Topical BID   Continuous Infusions:  cefTRIAXone  (ROCEPHIN )  IV 200 mL/hr at 04/11/24 1200   linezolid  (ZYVOX ) IV     magnesium  sulfate bolus IVPB     norepinephrine (LEVOPHED) Adult infusion 5 mcg/min (04/11/24 1200)   PRN Meds: acetaminophen , docusate sodium , fentaNYL  (SUBLIMAZE ) injection, ondansetron  (ZOFRAN ) IV, mouth rinse, polyethylene glycol  Allergies:    Allergies  Allergen Reactions   Brimonidine Tartrate Shortness Of Breath    Alphagan-Shortness of breath   Brinzolamide Shortness Of Breath    AZOPT- Shortness of breath   Latanoprost Shortness Of Breath    XALATAN- Shortness of breath   Nucynta  [Tapentadol ] Shortness Of Breath   Sulfa Antibiotics Palpitations   Timolol Maleate  Shortness Of Breath and Other (See Comments)    TIMOPTIC- Aggravated asthma   Diltiazem Swelling     leg swelling   Rofecoxib Swelling     VIOXX- leg swelling   Vancomycin Rash and Other (See Comments)    Blisters   Codeine Other (See Comments)    Childhood reaction   Tamsulosin Other (See Comments)    Dizziness    Celecoxib Other (See Comments)    CELLBREX-confusion   Colchicine Diarrhea    diarrhea   Tape Rash   Social History:   Social History   Socioeconomic History   Marital status: Married    Spouse name: Not on file   Number of children: 0   Years of education: 20   Highest education level: Master's degree (e.g., MA, MS, MEng, MEd, MSW, MBA)  Occupational History   Occupation: Public relations account executive    Comment: retired  Tobacco Use   Smoking status: Never    Passive exposure: Never   Smokeless tobacco: Never  Vaping Use   Vaping status: Never Used  Substance and Sexual Activity  Alcohol  use: No    Alcohol /week: 0.0 standard drinks of alcohol    Drug use: No    Types: Oxycodone    Sexual activity: Not Currently  Other Topics Concern   Not on file  Social History Narrative   HSG, [name] - BS, [name] - MS-engineering, 2 years on PhD - [employer] . Married - '65 - 63yrs/divorced; '76- 3 yrs/divorced; '92 . No children. 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    Lives with wife as of 2020. ACP/Living Will - Yes CPR; long-term Mechanical ventilation as long as he was able to cognate; ok for long term artificial nutrition. Precondition being able to cognate and not to have too much pain.    Social Drivers of Corporate investment banker Strain: Low Risk  (09/23/2023)   Overall Financial Resource Strain (CARDIA)    Difficulty of Paying Living Expenses: Not very hard  Food Insecurity: No Food Insecurity (03/31/2024)   Hunger Vital Sign    Worried About Running Out of Food in the Last Year: Never true    Ran Out of Food in the Last Year: Never true   Transportation Needs: No Transportation Needs (03/31/2024)   PRAPARE - Administrator, Civil Service (Medical): No    Lack of Transportation (Non-Medical): No  Physical Activity: Unknown (09/23/2023)   Exercise Vital Sign    Days of Exercise per Week: 0 days    Minutes of Exercise per Session: Not on file  Stress: No Stress Concern Present (03/21/2024)   Received from [name] of Occupational Health - Occupational Stress Questionnaire    Do you feel stress - tense, restless, nervous, or anxious, or unable to sleep at night because your mind is troubled all the time - these days?: Not at all  Social Connections: Socially Isolated (09/23/2023)   Social Connection and Isolation Panel    Frequency of Communication with Friends and Family: Once a week    Frequency of Social Gatherings with Friends and Family: Never    Attends Religious Services: Never    Database administrator or Organizations: No    Attends Engineer, structural: Not on file    Marital Status: Married  Catering manager Violence: Not At Risk (03/31/2024)   Humiliation, Afraid, Rape, and Kick questionnaire    Fear of Current or Ex-Partner: No    Emotionally Abused: No    Physically Abused: No    Sexually Abused: No    Family History:   Family History  Problem Relation Age of Onset   Hypertension Mother    Coronary artery disease Mother    Heart attack Mother    Neuropathy Mother    Pulmonary fibrosis Father        asbestosis    ROS:  Please see the history of present illness.  All other ROS reviewed and negative.     Physical Exam/Data: Vitals:   04/11/24 1115 04/11/24 1145 04/11/24 1200 04/11/24 1215  BP: (!) 126/56  (!) 105/90 118/61  Pulse:      Resp: 13  16 16   Temp:  98.3 F (36.8 C)    TempSrc:  Oral    SpO2:      Weight:      Height:        Intake/Output Summary (Last 24 hours) at 04/11/2024 1344 Last data filed at 04/11/2024 1200 Gross per 24 hour   Intake 1675.73 ml  Output 2050 ml  Net -374.27 ml  04/11/2024    5:00 AM 04/10/2024   10:40 AM 04/10/2024    5:26 AM  Last 3 Weights  Weight (lbs) 258 lb 2.5 oz 262 lb 2 oz 286 lb 9.6 oz  Weight (kg) 117.1 kg 118.9 kg 130 kg     Body mass index is 31.42 kg/m.   General:  chronically ill-appearing, in no acute distress HEENT: normal Neck: no JVD Vascular: No carotid bruits; Distal pulses 2+ bilaterally Cardiac:  RRR; + systolic murmur Lungs:  clear to auscultation bilaterally Abd: soft, nontender, no hepatomegaly  Ext: 1+ edema, chronic venous stasis changes noted Musculoskeletal:  No deformities Skin: warm and dry  Neuro:  no focal abnormalities noted Psych:  Normal affect, alert   EKG:  The EKG was personally reviewed and demonstrates:  V-paced, HR 81  Telemetry:  Telemetry was personally reviewed and demonstrates:  V-paced, HR 80s-90s, PVCs  Relevant CV Studies:  Echocardiogram, 04/10/2024 Left ventricular ejection fraction, by estimation, is 65 to 70% . The left ventricle has normal function. The left ventricle has no regional wall motion abnormalities. There is mild concentric left ventricular hypertrophy. Left ventricular diastolic parameters are indeterminate.  Right ventricular systolic function is normal. The right ventricular size is normal.  Left atrial size was moderately dilated. Possible vegetation versus mobile calcium  on anterior leaflet. The mitral valve is abnormal. No evidence of mitral valve regurgitation. Mild mitral stenosis. Severe mitral annular calcification. The aortic valve is abnormal. There is severe calcifcation of the aortic valve. Aortic valve regurgitation is not visualized. Severe aortic valve stenosis. Aortic valve area, by VTI measures 0. 76 cm . Aortic valve mean gradient measures 46. 0 mmHg. Aortic valve Vmax measures 4. 58 m/ s.  The inferior vena cava is normal in size with greater than 50% respiratory variability, suggesting right  atrial pressure of 3 mmHg.  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  This is a small branch vessel and not amenable to PCI given the fact that it would jeopardize the LAD and the diameter of the diagonal is likely too small for stenting. Recommend medical therapy with aspirin , Eliquis , statin.   Recommend the addition of Ranexa.   Laboratory Data: High Sensitivity Troponin:   Recent Labs  Lab 04/10/24 0942  TROPONINIHS 10     Chemistry Recent Labs  Lab 04/10/24 0548 04/10/24 0601 04/11/24 0725  NA 130* 130* 133*  K 4.9 5.5* 3.8  CL 91* 95* 95*  CO2 25  --  23  GLUCOSE 144* 140* 173*  BUN 83* 97* 63*  CREATININE 3.12* 3.30* 2.23*  CALCIUM  9.0  --  8.7*  MG  --   --  1.6*  GFRNONAA 19*  --  28*  ANIONGAP 14  --  15    Recent Labs  Lab 04/10/24 0548  PROT 6.5  ALBUMIN 3.4*  AST 75*  ALT 36  ALKPHOS 90  BILITOT 0.9   Lipids No results for input(s): CHOL, TRIG, HDL, LABVLDL,  LDLCALC, CHOLHDL in the last 168 hours.  Hematology Recent Labs  Lab 04/10/24 0548 04/10/24 0601 04/11/24 0725  WBC 9.0  --  10.2  RBC 3.63*  --  3.56*  HGB 11.9* 11.9* 11.7*  HCT 35.0* 35.0* 33.3*  MCV 96.4  --  93.5  MCH 32.8  --  32.9  MCHC 34.0  --  35.1  RDW 14.4  --  14.2  PLT 153  --  148*   Thyroid  No results for input(s): TSH, FREET4 in the last 168 hours.   BNP Recent Labs  Lab 04/10/24 0548  BNP 60.6    DDimer No results for input(s): DDIMER in the last 168 hours.  Radiology/Studies:  ECHOCARDIOGRAM COMPLETE Result Date: 04/10/2024    ECHOCARDIOGRAM REPORT   Patient Name:   Tim [name] Date of Exam: 04/10/2024 Medical Rec #:  [id number]       Height:       76.0 in Accession #:    [id number]      Weight:       262.1 lb Date of Birth:  12-28-1940       BSA:          2.485 m Patient Age:    83 years        BP:           94/63 mmHg Patient Gender: M               HR:           89 bpm. Exam Location:  Inpatient Procedure: 2D Echo, Cardiac Doppler, Color Doppler and Intracardiac            Opacification Agent (Both Spectral and Color Flow Doppler were            utilized during procedure). Indications:    Shock R57.9  History:        Patient has prior history of Echocardiogram examinations, most                 recent 04/29/2020. CHF, CAD and Previous Myocardial Infarction,                 Defibrillator, COPD and Pulmonary HTN, Arrythmias:Atrial                 Fibrillation, Signs/Symptoms:Edema, Dyspnea and Murmur; Risk                 Factors:Sleep Apnea, Hypertension and Diabetes. H/O Shock,                 Aortic stenosis S/P TAVR, Hypotension, Mitral valve vegitation,                 Prosthetic valve endocarditis, Small PFO.  Sonographer:    BERNARDA ROCKS Referring Phys: [id number] JOSHUA C SMITH IMPRESSIONS  1. Left ventricular ejection fraction, by estimation, is 65 to 70%. The left ventricle has normal function. The left ventricle has no regional wall motion abnormalities. There is mild concentric left ventricular hypertrophy. Left ventricular diastolic parameters are indeterminate.  2. Right ventricular systolic function is normal. The right ventricular size is normal.  3. Left atrial size was moderately dilated.  4. Possible vegetation versus mobile calcium  on anterior leaflet. The mitral valve is abnormal. No evidence of mitral valve regurgitation.  Mild mitral stenosis. Severe mitral annular calcification.  5. The aortic valve is abnormal. There is severe calcifcation of the aortic valve. Aortic valve regurgitation is not visualized. Severe aortic valve stenosis.  Aortic valve area, by VTI measures 0.76 cm. Aortic valve mean gradient measures 46.0 mmHg. Aortic valve Vmax measures 4.58 m/s.  6. The inferior vena cava is normal in size with greater than 50% respiratory variability, suggesting right atrial pressure of 3 mmHg. Conclusion(s)/Recommendation(s): Technically very difficult study due to poor sound wave transmission. FINDINGS  Left Ventricle: Left ventricular ejection fraction, by estimation, is 65 to 70%. The left ventricle has normal function. The left ventricle has no regional wall motion abnormalities. Definity  contrast agent was given IV to delineate the left ventricular  endocardial borders. The left ventricular internal cavity size was normal in size. There is mild concentric left ventricular hypertrophy. Left ventricular diastolic parameters are indeterminate. Right Ventricle: The right ventricular size is normal. No increase in right ventricular wall thickness. Right ventricular systolic function is normal. Left Atrium: Left atrial size was moderately dilated. Right Atrium: Right atrial size was normal in size. Pericardium: There is no evidence of pericardial effusion. Mitral Valve: Possible vegetation versus mobile calcium  on anterior leaflet. The mitral valve is abnormal. There is mild calcification of the mitral valve leaflet(s). Severe mitral annular calcification. No evidence of mitral valve regurgitation. Mild mitral valve stenosis. MV peak gradient, 11.7 mmHg. The mean mitral valve gradient is 5.0 mmHg. Tricuspid Valve: The tricuspid valve is not well visualized. Tricuspid valve regurgitation is not demonstrated. No evidence of tricuspid stenosis. Aortic Valve: The aortic valve is abnormal. There is severe calcifcation of the aortic  valve. Aortic valve regurgitation is not visualized. Severe aortic stenosis is present. Aortic valve mean gradient measures 46.0 mmHg. Aortic valve peak gradient measures 83.9 mmHg. Aortic valve area, by VTI measures 0.76 cm. 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 Pulmonic Valve: The pulmonic valve was not well visualized. Pulmonic valve regurgitation is not visualized. No evidence of pulmonic stenosis. Aorta: The aortic root is normal in size and structure. Venous: The inferior vena cava was not well visualized. The inferior vena cava is normal in size with greater than 50% respiratory variability, suggesting right atrial pressure of 3 mmHg. IAS/Shunts: No atrial level shunt detected by color flow Doppler. Additional Comments: A device lead is visualized.  LEFT VENTRICLE PLAX 2D LVIDd:         2.80 cm      Diastology LVIDs:         1.70 cm      LV e' medial:    10.30 cm/s LV PW:         1.10 cm      LV E/e' medial:  11.2 LV IVS:        1.10 cm      LV e' lateral:   8.05 cm/s LVOT diam:     1.70 cm      LV E/e' lateral: 14.3 LV SV:         67 LV SV Index:   27 LVOT Area:     2.27 cm  LV Volumes (MOD) LV vol d, MOD A2C: 180.0 ml LV vol d, MOD A4C: 141.0 ml LV vol s, MOD A2C: 55.3 ml LV vol s, MOD A4C: 41.6 ml LV SV MOD A2C:     124.7 ml LV SV MOD A4C:     141.0 ml LV SV MOD BP:      116.7 ml RIGHT VENTRICLE             IVC RV Basal diam:  3.85 cm  IVC diam: 1.30 cm RV S prime:     23.50 cm/s TAPSE (M-mode): 3.3 cm LEFT ATRIUM             Index        RIGHT ATRIUM           Index LA diam:        4.70 cm 1.89 cm/m   RA Area:     17.60 cm LA Vol (A2C):   99.7 ml 40.12 ml/m  RA Volume:   47.40 ml  19.07 ml/m LA Vol (A4C):   60.5 ml 24.34 ml/m LA Biplane Vol: 81.4 ml 32.75 ml/m  AORTIC VALVE                     PULMONIC VALVE AV Area (Vmax):    0.72 cm      PV Vmax:          0.90 m/s AV Area (Vmean):   0.66 cm      PV Peak grad:     3.2 mmHg AV Area  (VTI):     0.76 cm      PR End Diast Vel: 1.99 msec AV Vmax:           458.00 cm/s AV Vmean:          319.000 cm/s AV VTI:            0.886 m AV Peak Grad:      83.9 mmHg AV Mean Grad:      46.0 mmHg LVOT Vmax:         145.00 cm/s LVOT Vmean:        93.050 cm/s LVOT VTI:          0.297 m LVOT/AV VTI ratio: 0.34  AORTA Ao Root diam: 2.30 cm MITRAL VALVE MV Area (PHT): 4.46 cm     SHUNTS MV Area VTI:   1.56 cm     Systemic VTI:  0.30 m MV Peak grad:  11.7 mmHg    Systemic Diam: 1.70 cm MV Mean grad:  5.0 mmHg MV Vmax:       1.71 m/s MV Vmean:      98.4 cm/s MV Decel Time: 170 msec MV E velocity: 115.00 cm/s MV A velocity: 167.00 cm/s MV E/A ratio:  0.69 Toribio Fuel MD Electronically signed by Toribio Fuel MD Signature Date/Time: 04/10/2024/3:46:19 PM    Final    CT HEAD WO CONTRAST Result Date: 04/10/2024 EXAM: CT HEAD WITHOUT CONTRAST 04/10/2024 06:58:57 AM TECHNIQUE: CT of the head was performed without the administration of intravenous contrast. Automated exposure control, iterative reconstruction, and/or weight based adjustment of the mA/kV was utilized to reduce the radiation dose to as low as reasonably achievable. COMPARISON: Head CT 12/05/2019. CLINICAL HISTORY: 83 year old male. Mental status change, unknown cause. History of leg laceration without fall, treated, discharged, and having confusion since. FINDINGS: BRAIN AND VENTRICLES: No acute hemorrhage. No evidence of acute infarct. No hydrocephalus. No extra-axial collection. No mass effect or midline shift. Cerebral volume is stable and within normal limits for age. 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 Otherwise gray white differentiation within normal limits for age. Calcified atherosclerosis at the skull base. No suspicious intracranial vascular hyperdensity. ORBITS: Postoperative changes to both orbits. SINUSES: Chronic retained secretions and or polyp extending from the posterior right nasal cavity into the nasopharynx, but  considerably smaller since 2021. SOFT TISSUES AND SKULL: Mild motion artifact at the skull base. No skull  fracture. No acute scalp soft tissue injury identified. IMPRESSION: 1. No acute intracranial abnormality. Small chronic right cerebellar infarcts. Electronically signed by: Helayne Hurst MD 04/10/2024 07:10 AM EDT RP Workstation: [id number]   DG Chest Portable 1 View Result Date: 04/10/2024 EXAM: 1 VIEW(S) XRAY OF THE CHEST 04/10/2024 06:23:00 AM COMPARISON: Portable chest 04/28/2020, chest CT 12/25/2012. CLINICAL HISTORY: 83 year old male. Hypotension. Encounter for hypotension. 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 LUNGS AND PLEURA: Normal lung volumes. Allowing for portable technique both lungs appear clear. No focal pulmonary opacity. No pulmonary edema. No pleural effusion. No pneumothorax. HEART AND MEDIASTINUM: Previous TAVR. Heart size and mediastinal contours within normal limits. BONES AND SOFT TISSUES: No acute osseous abnormality. IMPRESSION: 1. No acute cardiopulmonary process. 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 Suspected vegetation on mitral valve, new Meds: Amoxicillin  1000 mg BID Echo this admission showed: Possible vegetation noted on mitral valve Infectious disease following Blood cultures pending Currently on vasopressors, weaning down as tolerated per primary Suspect he will benefit from a TEE this admission, possibly Tuesday based on scheduling   Informed Consent   Shared Decision Making/Informed Consent   The risks [esophageal damage, perforation (1:10,000 risk), bleeding, pharyngeal hematoma as well as other potential complications associated with conscious sedation including aspiration, arrhythmia, respiratory failure and death], benefits  (treatment guidance and diagnostic support) and alternatives of a transesophageal echocardiogram were discussed in detail with Mr. Brennen and he is willing to proceed.     Paroxysmal atrial fibrillation Home meds: Eliquis  5 mg BID, carvedilol 3.125 mg BID Currently in a V-paced rhythm  Currently holding home Eliquis  due to hematuria Scheduled to start low-dose Eliquis  tomorrow  Currently holding home beta-blocker due to shock  CAD s/p PCI to RCA 2017, 2018 Hyperlipidemia Home meds: ASA 81 mg daily, Zetia  10 mg daily, Crestor  20 mg daily, Amlodipine 5 mg BID, carvedilol 3.125 mg BID, losartan  25 mg daily, Ranexa 500 mg BID LHC 03/2024 showed: Normal LM, 25% stenosis M LAD, 95% stenosis in ostial small diagonal, normal OM, widely patent RCA stent, normal PDA --diagonal lesion was not good PCI target, recommended ASA, Eliquis , statin, Ranexa Home medications on hold in the setting of shock Suspect this may have all been with the addition of amlodipine, carvedilol, losartan , Ranexa and taking his PRN torsemide  daily, without any other known triggers likely this was due to his medications Continue to hold home medications for now and will follow and titrate as needed, but likely does not need as many medications as when discharged   Cardiomyopathy s/p BiV ICD, 2019 Hypertension Home meds: Amlodipine 5 mg BID, carvedilol 3.125 mg BID, losartan  25 mg daily, Ranexa 500 mg BID, torsemide  20 mg PRN Echo this admission showed LVEF 65 to 70%, normal RV function BNP normal, troponin normal  Remote device check 02/2024: A-sensed, V-paced 96.8%, 6 months battery remaining, normal device function Medications on hold in the setting of shock  Per primary AKI, oliguric Shock Endocarditis OSA on CPAP Chronic pain BPH Gout Electrolyte disturbances   Risk Assessment/Risk Scores:      [place]  Heart Association (NYHA) Functional Class NYHA Class I  CHA2DS2-VASc Score = 6   This indicates a 9.7%  annual risk of stroke. The patient's score is based upon: CHF History: 1 HTN History: 1 Diabetes History:  1 Stroke History: 0 Vascular Disease History: 1 Age Score: 2 Gender Score: 0     For questions or updates, please contact [facility] Please consult [email or web address] for contact info under     Signed, Waddell DELENA Donath, PA-C  04/11/2024 1:44 PM

## 2024-04-11 NOTE — Progress Notes (Addendum)
 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  His wife notes over the weekend he was a lot little more confused.  He had worsening neuropathy symptoms and was taking extra gabapentin  and extra oxycodone .  She then notes over the next couple of days of the decreased urine output, decreased oral intake.  She checked his blood pressure at home and it was reading low.  At 1 point he was having such severe neuropathy that he fell and developed a left leg laceration.  The bleeding on this has since stopped.  His wife called EMS overnight due to the confusion and ended up bringing him in.  The patient was confused and lethargic when he came in but since receiving some IV fluids and vasopressors he seems more alert.  He is able to deny any fevers chills night sweats or weight loss.  He denies any cough or shortness of breath or chest pain.  He has some mild dysuria.  No abdominal pain or nausea.  No new skin rashes other than the laceration.  PCCM is consulted on this patient with hypotension requiring vasopressors and confusion.   Pertinent  Medical History  CAD s/p PCI, unstable angina, cannot intervene S/p BiV ICD H/o TAVR, bioprosthetic endocarditis on suppressive amoxicllin Gout [facility] [facility] Chronic pain on opioids GERD  Significant Hospital Events: Including procedures, antibiotic start and stop dates in addition to other pertinent events   10/10 - Echo shows possible vegetation versus calcium  on the anterior mitral leaflet cardiology and ID are  consulted  Interim History / Subjective:  Patient remains confused. He wakes up and intermittently follow commands.  His Levophed needs are coming down.  He is on 5 mcg/min of Levophed.  No family around yet.  Blood cultures are still pending.  Echo shows possible vegetation.  There is some bleeding in the urine.  Foley is in place.  Objective    Blood pressure 130/69, pulse 97, temperature 98.5 F (36.9 C), temperature source Oral, resp. rate 13, height 6' 4 (1.93 m), weight 117.1 kg, SpO2 95%.        Intake/Output Summary (Last 24 hours) at 04/11/2024 1008 Last data filed at 04/11/2024 0700 Gross per 24 hour  Intake 2320.45 ml  Output 1300 ml  Net 1020.45 ml   Filed Weights   04/10/24 0526 04/10/24 1040 04/11/24 0500  Weight: 130 kg 118.9 kg 117.1 kg    Examination: General: elderly, chronically ill apearing.  No distress HENT: dry mucus membranes Lungs: diminished, clear, no wheeze, on room air, non labored Cardiovascular: RRR, systolic murmur Abdomen: obese, soft, nontender Extremities: Venous eczema noted both legs.  There is pitting edema.  Left heel skin damages bandaged.  Left toe amputations noted. Neuro: awake, alert, intermittently follows commands.  He is not oriented.   GU: no scrotal edema.  Foley shows some bloody urine.   Resolved problem list   Assessment and Plan   Septic vs Hypovolemic Shock H/o Endocarditis H/o Atrial Fibrillation - Status post IVF resuscitation - vasopressors peripherall with norepinephrine goal MAP>65  - Levophed is being weaned down.  If we go about 10 mcg/min will consider central line. - blood cultures pending, - UA negative -Chest x-ray shows no pneumonia. - empiric pip-tazo.  And Zyvox .  ICD still in place. - ID is consulted. - Audiology is consulted. - Patient currently is on Eliquis  which will be held given the hematuria.  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 - Repeat echocardiogram on 10 9 showed possible vegetation versus calcification on the anterior leaflet. -Consulted cardiology - hold diuretics, anti-hypertensive medications. - On Eliquis  will be held today for hematuria  Oliguric AKI -improving - likely pre-renal vs intrinsic -Urine output is improving -Creatinine is improving -down to 2.23 from 3.30 - Urinalysis is negative - Monitor renal function, electrolytes and urine output.  OSA on CPAP - nocturnal cpap, per home settings  Code status - verified DNR/DNI with family at bedside  Chronic pain on oxycodone  and gabapentin  at home Myoclonus/Jerking -resolved Suspect gabapentin  accumulation due to renal failure Low-dose Neurontin  is restarted.  BPH - proscar  at home -  may need foley  Gout Hold allopurinol  given AKI  Prophylaxis already on Eliquis  this will be held given hematuria. CODE STATUS DNR/DNI Wife is in the room spoke with her and updated. Monitor nutritional status.    Labs   CBC: Recent Labs  Lab 04/10/24 0548 04/10/24 0601 04/11/24 0725  WBC 9.0  --  10.2  NEUTROABS 6.2  --   --   HGB 11.9* 11.9* 11.7*  HCT 35.0* 35.0* 33.3*  MCV 96.4  --  93.5  PLT 153  --  148*    Basic Metabolic Panel: Recent Labs  Lab 04/10/24 0548 04/10/24 0601 04/11/24 0725  NA 130* 130* 133*  K 4.9 5.5* 3.8  CL 91* 95* 95*  CO2 25  --  23  GLUCOSE 144* 140* 173*  BUN 83* 97* 63*  CREATININE 3.12* 3.30* 2.23*  CALCIUM  9.0  --  8.7*  MG  --   --  1.6*  PHOS  --   --  3.8   GFR: Estimated Creatinine Clearance: 34.5 mL/min (A) (by C-G formula based on SCr of 2.23 mg/dL (H)). Recent Labs  Lab 04/10/24 0548 04/10/24 0558 04/10/24 0841 04/10/24 0844 04/10/24 0942 04/10/24 1306 04/11/24 0725  PROCALCITON  --   --   --    --  0.13  --   --   WBC 9.0  --   --   --   --   --  10.2  LATICACIDVEN  --    < > 2.0* 0.8 0.7 0.9  --    < > = values in this interval not displayed.    Liver Function Tests: Recent Labs  Lab 04/10/24 0548  AST 75*  ALT 36  ALKPHOS 90  BILITOT 0.9  PROT 6.5  ALBUMIN 3.4*   No results for input(s): LIPASE, AMYLASE in the last 168 hours. No results for input(s): AMMONIA in the last 168 hours.  ABG    Component Value Date/Time   PHART 7.397 12/24/2012 1719   PCO2ART 40.9 12/24/2012 1719   PO2ART 87.0 12/24/2012 1719   HCO3 26.7 (H) 12/24/2012 1726   TCO2 29 04/10/2024 0601   O2SAT 69.0 12/24/2012 1726     Coagulation Profile: Recent Labs  Lab 04/10/24  9381  INR 1.9*    Cardiac Enzymes: Recent Labs  Lab 04/10/24 0942  CKTOTAL 53    HbA1C: Hemoglobin A1C  Date/Time Value Ref Range Status  03/06/2024 02:50 PM 5.8 (A) 4.0 - 5.6 % Final  06/08/2023 03:20 PM 5.5 4.0 - 5.6 % Final   HbA1c, POC (prediabetic range)  Date/Time Value Ref Range Status  03/06/2024 02:50 PM 5.8 5.7 - 6.4 % Final  06/08/2023 03:20 PM 5.5 (A) 5.7 - 6.4 % Final   HbA1c, POC (controlled diabetic range)  Date/Time Value Ref Range Status  03/06/2024 02:50 PM 5.8 0.0 - 7.0 % Final  06/08/2023 03:20 PM 5.5 0.0 - 7.0 % Final   HbA1c POC (<> result, manual entry)  Date/Time Value Ref Range Status  03/06/2024 02:50 PM 5.8 4.0 - 5.6 % Final  06/08/2023 03:20 PM 5.5 4.0 - 5.6 % Final    CBG: Recent Labs  Lab 04/10/24 1518 04/10/24 2007 04/10/24 2352 04/11/24 0339 04/11/24 0759  GLUCAP 163* 160* 196* 162* 170*    Review of Systems:   + confusion, dizziness, neuropathy, dysuria - fevers chills night sweats hematuria Rest per HPI above Past Medical History:  He,  has a past medical history of AICD (automatic cardioverter/defibrillator) present, Balanitis, BPH (benign prostatic hypertrophy), CAD (coronary artery disease), CATARACT, HX OF, Chronic combined systolic and  diastolic CHF (congestive heart failure) (HCC) (05/2016), Chronic pain syndrome, Complicated UTI (urinary tract infection) (09/2019), COPD, Debilitated patient, Diabetes mellitus type 2 with complications (HCC), Enterococcal bacteremia (09/2019), Essential hypertension, benign, Fatty liver (2007), Generalized weakness, GERD (gastroesophageal reflux disease), Glaucoma, GOUT, HH (hiatus hernia), HYPERCHOLESTEROLEMIA-PURE, Hypogonadism male, ICD (implantable cardioverter-defibrillator) infection (12/22/2019), 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), Iron deficiency anemia, Lumbosacral neuritis, Lumbosacral spondylosis, Morbid obesity (HCC), Normal memory function (08/2014), Normocytic anemia (03/2016), NSTEMI (non-ST elevated myocardial infarction) (HCC) (03/20/2016), Obesity hypoventilation syndrome (HCC), Orthostatic hypotension, OSA on CPAP, OSTEOARTHRITIS, 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), Paroxysmal atrial fibrillation (HCC) (2003), Peripheral neuropathy, Personal history of colonic adenoma (10/30/2012), PFO (patent foramen ovale) (09/2019), Physical deconditioning (02/23/2021), Presence of cardiac defibrillator (11/07/2017), Primary osteoarthritis of both knees, 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   Surgical History:   Past Surgical History:  Procedure Laterality Date   AMPUTATION Left 04/11/2013   Procedure: AMPUTATION DIGIT Left 3rd toe;  Surgeon: Jerona LULLA Sage, MD;  Location: [facility] OR;  Service: Orthopedics;  Laterality: Left;  Left 3rd toe  amputation at [facility]   AMPUTATION Left 08/29/2019   Procedure: LEFT TRANSMETATARSAL AMPUTATION;  Surgeon: Sage Jerona LULLA, MD;  Location: [facility] OR;  Service: Orthopedics;  Laterality: Left;   BIOPSY  05/04/2020   Procedure: BIOPSY;  Surgeon: Charlanne Groom, MD;  Location: [facility] ENDOSCOPY;  Service: Endoscopy;;   CARDIAC CATHETERIZATION  1997; 03/10/16   1997 Non-obstructive disease.  03/2016 BMS to RCA, with 25% pDiag dz, o/w normal cors per cath 03/07/16.  Cath 11/01/16: in stent restenosis, successful baloon angioplasty. 08/2020 branch vessel dz, cont medical therapy rec'd.   CARDIAC CATHETERIZATION  12/24/2012   mild < 20% LCx, prox 30% RCA; LVEF 55-65% , moderate pulmonary HTN, moderate AS   CARDIAC DEFIBRILLATOR PLACEMENT  11/07/2017   [facility] MRI Quad CRT defibrillator   CARDIOVASCULAR STRESS  TEST  05/11/16 ([facility])   2017 Myocardial perfusion imaging:  No ischemia; scar in apex, global hypokinesis, EF 36%.  06/15/20->mod primarily fixed inferolat wall defect mildly worse with stress c/w infarct/scar with mild peri-infarct ischemia, normal EF-->for cath per cards.   Carotid dopplers  03/09/2016   [facility]: no hemodynamically significant stenosis on either side.   CHOLECYSTECTOMY     COLONOSCOPY [name] 10/30/2012   Procedure: COLONOSCOPY;  Surgeon: Lupita FORBES Commander, MD;  Location: [facility] ENDOSCOPY;  Service: Endoscopy;  Laterality: [name];   CORONARY ANGIOPLASTY WITH STENT PLACEMENT  03/2016; 04/2017   2017-[facility]: BMS to RCA-pt was placed on Brilinta.  04/2017: DES to RCA.   DORSAL SLIT [name] 10/29/2019   for severe phimosis. Procedure: DORSAL SLIT;  Surgeon: Alvaro Hummer, MD;  Location: [facility] ORS;  Service: Urology;  Laterality: [name];  45 MINS   ESOPHAGEAL DILATION  05/04/2020   Procedure: ESOPHAGEAL DILATION;  Surgeon: Charlanne Groom, MD;  Location: [name] ENDOSCOPY;  Service: Endoscopy;;   ESOPHAGOGASTRODUODENOSCOPY (EGD) WITH PROPOFOL  [name] 05/04/2020   Procedure: ESOPHAGOGASTRODUODENOSCOPY (EGD) WITH PROPOFOL ;  Surgeon:  Charlanne Groom, MD;  Location: [name] ENDOSCOPY;  Service: Endoscopy;  Laterality: [name];   EYE SURGERY Bilateral cataract   HEMORRHOID SURGERY     INTRAOCULAR LENS INSERTION Bilateral    KNEE SURGERY Right    LEFT AND RIGHT HEART CATHETERIZATION WITH CORONARY ANGIOGRAM [name] 12/24/2012   Procedure: LEFT AND RIGHT HEART CATHETERIZATION WITH CORONARY ANGIOGRAM;  Surgeon: Peter M Swaziland, MD;  Location: [facility] CATH LAB;  Service: Cardiovascular;  Laterality: [name];   LEG SURGERY Bilateral    lenghtening    PACEMAKER PLACEMENT  04/13/2016   2nd deg HB after TAVR, pt had DC MDT PPM placed.   SHOULDER ARTHROSCOPY  08/30/2011   Procedure: ARTHROSCOPY SHOULDER;  Surgeon: Jerona LULLA Sage, MD;  Location: [facility] OR;  Service: Orthopedics;  Laterality: Right;  Right Shoulder Arthroscopy, Debridement, and Decompression   SPINAL CORD STIMULATOR INSERTION [name] 09/10/2015   Procedure: LUMBAR SPINAL CORD STIMULATOR INSERTION;  Surgeon: Deward Fabian, MD;  Location: [facility] NEURO ORS;  Service: Neurosurgery;  Laterality: [name];   TEE WITHOUT CARDIOVERSION [name] 09/16/2019   Procedure: TRANSESOPHAGEAL ECHOCARDIOGRAM (TEE);  Surgeon: Raford Riggs, MD;  Location: [name] ENDOSCOPY;  Service: Cardiovascular;  Laterality: [name];   TEE WITHOUT CARDIOVERSION [name] 12/02/2019   +vegetation on AVR and pacer lead in RV.  EF 55-60%, normal wall motion.  Valves function normal.  Procedure: TRANSESOPHAGEAL ECHOCARDIOGRAM (TEE);  Surgeon: Barbaraann Darryle Ned, MD;  Location: [facility] ENDOSCOPY;  Service: Cardiovascular;  Laterality: [name];   TOE AMPUTATION Left    due to osteomyelitis.  R big toe surg due to osteoarth   TONSILLECTOMY     traeculectomy Left    eye   TRANSCATHETER AORTIC VALVE REPLACEMENT, TRANSFEMORAL  04/11/2016   TRANSESOPHAGEAL ECHOCARDIOGRAM  03/09/2016; 09/2019   [facility]: EF 55-60%, PFO seen with bi-directional shunting, no thrombus in appendage.  09/2019 ->no valvular vegetations. Small patent foramen ovale with predominantly left to right  shunting across the interatrial septum.   TRANSTHORACIC ECHOCARDIOGRAM  01/2015; 01/2016; 05/18/16; 09/18/16, 05/2017, 08/2017   01/2015 No signif change in aortic stenosis (moderate).  01/2016 Severe LVH w/small LV cavity, EF 60-65%, grade I diast dysfxn.  05/2016 (s/p TAVR): EF 50-55%, grd I DD, biopros AV good.  08/2016--EF 50-55%, LV septal motion c/w conduction abnl, grd I DD,mild MS,bioprosth aortic valve well seated, w/trace AR. 05/2017 TTE EF 35%. 08/2017-EF 35%, mod diff hypokin LV, grd I DD, biopros AV good.  TRANSTHORACIC ECHOCARDIOGRAM  04/2018; 09/2019   04/2018: EF 40-45%, mod diffuse LV hypokin, grd I DD, bioprosth AV well seated, no AS or AR. 09/2019 EF 60-65%, grd I DD, valves fine, including bioprosth AV. 04/29/20 (tech diff) EF 55-60%, grd I DD, vegetation on MV.  07/2021 EF 55-60% (s/p upgrade to BiV PPM), AV well seated.   VITRECTOMY       Social History:   reports that he has never smoked. He has never been exposed to tobacco smoke. He has never used smokeless tobacco. He reports that he does not drink alcohol  and does not use drugs.   Family History:  His family history includes Coronary artery disease in his mother; Heart attack in his mother; Hypertension in his mother; Neuropathy in his mother; Pulmonary fibrosis in his father.   Allergies Allergies  Allergen Reactions   Brimonidine Tartrate Shortness Of Breath    Alphagan-Shortness of breath   Brinzolamide Shortness Of Breath    AZOPT- Shortness of breath   Latanoprost Shortness Of Breath    XALATAN- Shortness of breath   Nucynta  [Tapentadol ] Shortness Of Breath   Sulfa Antibiotics Palpitations   Timolol Maleate Shortness Of Breath and Other (See Comments)    TIMOPTIC- Aggravated asthma   Diltiazem Swelling     leg swelling   Rofecoxib Swelling     VIOXX- leg swelling   Vancomycin Rash and Other (See Comments)    Blisters   Codeine Other (See Comments)    Childhood reaction   Tamsulosin Other (See Comments)     Dizziness    Celecoxib Other (See Comments)    CELLBREX-confusion   Colchicine Diarrhea    diarrhea   Tape Rash     Home Medications  Prior to Admission medications   Medication Sig Start Date End Date Taking? Authorizing Provider  albuterol  (VENTOLIN  HFA) 108 (90 Base) MCG/ACT inhaler TAKE 2 PUFFS BY MOUTH EVERY 6 HOURS AS NEEDED FOR WHEEZE OR SHORTNESS OF BREATH 03/04/24  Yes McGowen, Aleene DEL, MD  allopurinol  (ZYLOPRIM ) 300 MG tablet Take 1 tablet (300 mg total) by mouth daily. with food Patient taking differently: Take 300 mg by mouth every evening. 12/12/23  Yes McGowen, Aleene DEL, MD  amLODipine (NORVASC) 5 MG tablet Take 5 mg by mouth in the morning and at bedtime. 03/28/24 06/26/24 Yes [provider]  amoxicillin  (AMOXIL ) 500 MG capsule Take 2 capsules (1,000 mg total) by mouth 2 (two) times daily. 03/17/24  Yes Fleeta Rothman, Jomarie SAILOR, MD  apixaban  (ELIQUIS ) 5 MG TABS tablet Take 5 mg by mouth 2 (two) times daily.   Yes [provider]  ASPIRIN  LOW DOSE 81 MG EC tablet Take 81 mg by mouth daily. 02/03/19  Yes [provider]  B Complex-C (B-COMPLEX WITH VITAMIN C ) tablet Take 1 tablet by mouth daily. Take 1 tablet daily, 250mg    Yes [provider]  carvedilol (COREG) 3.125 MG tablet Take 3.125 mg by mouth 2 (two) times daily with a meal. 03/28/24 06/26/24 Yes [provider]  clotrimazole -betamethasone  (LOTRISONE ) cream Apply 1 Application topically daily. 03/28/24 03/28/25 Yes [provider]  ezetimibe  (ZETIA ) 10 MG tablet Take 1 tablet (10 mg total) by mouth daily. 12/16/19  Yes McGowen, Aleene DEL, MD  ferrous gluconate  (FERGON) 324 MG tablet Take 1 tablet (324 mg total) by mouth 2 (two) times daily. 02/19/24  Yes McGowen, Aleene DEL, MD  FIBER PO Take 5 capsules by mouth in the morning and at bedtime.   Yes [provider]  finasteride  (PROSCAR ) 5 MG tablet Take 1 tablet (5 mg total) by mouth daily. 02/19/24  Yes McGowen, Aleene DEL, MD   fluticasone  (FLONASE ) 50 MCG/ACT nasal spray Place 2 sprays into both nostrils daily. Patient taking differently: Place 2 sprays into both nostrils daily as needed for allergies. 03/06/24  Yes McGowen, Aleene DEL, MD  fluticasone -salmeterol (WIXELA INHUB) 250-50 MCG/ACT AEPB Inhale 1 puff into the lungs 2 (two) times daily. in the morning and at bedtime. 12/12/23  Yes McGowen, Aleene DEL, MD  gabapentin  (NEURONTIN ) 800 MG tablet TAKE 1 TABLET BY MOUTH FOUR TIMES A DAY Patient taking differently: Take 800 mg by mouth 2 (two) times daily. 10/29/23  Yes McGowen, Aleene DEL, MD  loratadine (CLARITIN) 10 MG tablet Take 10 mg by mouth daily.   Yes [provider]  losartan  (COZAAR ) 25 MG tablet Take 25 mg by mouth daily. 03/29/24 06/27/24 Yes [provider]  mometasone -formoterol  (DULERA ) 100-5 MCG/ACT AERO Inhale 2 puffs into the lungs as needed for wheezing or shortness of breath.   Yes [provider]  Multiple Vitamin (MULTIVITAMIN ADULT PO) Take 2 tablets by mouth daily.   Yes [provider]  naloxone  (NARCAN ) nasal spray 4 mg/0.1 mL One spray in nostril as needed for opoid overdose.  May repeat dose in 5 min if needed. Patient taking differently: Place 0.4 mg into the nose as needed (OD). 06/08/23  Yes McGowen, Aleene DEL, MD  niacin  (NIASPAN ) 1000 MG CR tablet TAKE 1 TABLET BY MOUTH TWICE A DAY 03/17/24  Yes McGowen, Aleene DEL, MD  nitroGLYCERIN  (NITROSTAT ) 0.4 MG SL tablet Place 0.4 mg under the tongue every 5 (five) minutes as needed for chest pain. 09/04/17  Yes [provider]  Oxycodone  HCl 20 MG TABS 1 tab po bid prn severe pain Patient taking differently: Take 20 mg by mouth daily. 03/21/24  Yes McGowen, Aleene DEL, MD  pantoprazole  (PROTONIX ) 40 MG tablet Take 1 tablet (40 mg total) by mouth daily. Patient taking differently: Take 40 mg by mouth 2 (two) times daily. 09/01/22  Yes McGowen, Aleene DEL, MD  potassium chloride  SA (KLOR-CON  M) 20 MEQ tablet Take 1 tablet  (20 mEq total) by mouth daily. Patient taking differently: Take 10 mEq by mouth 2 (two) times daily. 12/12/23  Yes McGowen, Aleene DEL, MD  ranolazine (RANEXA) 500 MG 12 hr tablet Take 500 mg by mouth 2 (two) times daily. 03/28/24 06/26/24 Yes [provider]  rosuvastatin  (CRESTOR ) 20 MG tablet Take 20 mg by mouth daily. 10/09/22  Yes [provider]  torsemide  (DEMADEX ) 20 MG tablet Take 1 tablet (20 mg total) by mouth 2 (two) times daily. Patient taking differently: Take 20 mg by mouth daily. 12/12/23  Yes McGowen, Aleene DEL, MD  VITAMIN D, CHOLECALCIFEROL, PO Take 1 tablet by mouth daily.    Yes [provider]  diclofenac  (VOLTAREN ) 75 MG EC tablet Take 1 tablet (75 mg total) by mouth 2 (two) times daily. Patient not taking: Reported on 04/10/2024 02/12/24   Valdemar Rocky SAUNDERS, NP     Critical care time:     The patient is critically ill due to shock.  Critical care was necessary to treat or prevent imminent or life-threatening deterioration.  Critical care was time spent personally by me on the following activities: development of treatment plan with patient and/or surrogate as well as nursing, discussions with consultants, evaluation of patient's response to treatment, examination of patient, obtaining history from patient or surrogate,  ordering and performing treatments and interventions, ordering and review of laboratory studies, ordering and review of radiographic studies, pulse oximetry, re-evaluation of patient's condition and participation in multidisciplinary rounds.   Critical Care Time devoted to patient care services described in this note is 40 minutes. This time reflects time of care of this signee [facility] . This critical care time does not reflect separately billable procedures or procedure time, teaching time or supervisory time of PA/NP/Med student/Med Resident etc but could involve care discussion time.       [facility] Pulmonary and  Critical Care Medicine 04/11/2024 10:08 AM  Pager: see AMION If no response to pager , please call critical care on call (see AMION) until 7pm After 7:00 pm call Elink

## 2024-04-11 NOTE — Plan of Care (Signed)
  Problem: Education: Goal: Knowledge of General Education information will improve Description: Including pain rating scale, medication(s)/side effects and non-pharmacologic comfort measures Outcome: Progressing   Problem: Clinical Measurements: Goal: Cardiovascular complication will be avoided Outcome: Progressing   Problem: Nutrition: Goal: Adequate nutrition will be maintained Outcome: Progressing   Problem: Elimination: Goal: Will not experience complications related to bowel motility Outcome: Progressing Goal: Will not experience complications related to urinary retention Outcome: Progressing   Problem: Pain Managment: Goal: General experience of comfort will improve and/or be controlled Outcome: Progressing   Problem: Clinical Measurements: Goal: Diagnostic test results will improve Outcome: Not Progressing   Problem: Activity: Goal: Risk for activity intolerance will decrease Outcome: Not Progressing   Problem: Coping: Goal: Level of anxiety will decrease Outcome: Not Progressing   Problem: Safety: Goal: Ability to remain free from injury will improve Outcome: Not Progressing   Problem: Skin Integrity: Goal: Risk for impaired skin integrity will decrease Outcome: Not Progressing

## 2024-04-11 NOTE — Consult Note (Signed)
 [facility]    Date of Admission:  04/10/2024    Reason for Consult: Infective endocarditis    Referring Provider: Dr. Tamela   83 year old male with past medical history of COPD, CHF, cardiomyopathy, diabetes mellitus, hypertension was brought in from home for concern of generalized weakness. Patient apparently was quite confused at home.  Patient was noted to have laceration to the left leg.  Patient has been complaining of progressive neuropathy as per the wife since Sunday that is 03/07/2024. 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  He also has chronic pain and is on opioids. The patient also noticed that the patient has had increasing confusion and was even finding it difficult to get to the bedside commode. Usually he is quite independent.  Patient also had decreased urinary output and urinated only once or twice per day.  Patient was found to have some shaking episodes and there was some difficulty coordinating his hand to his mouth while eating.  Patient had decreased p.o. intake. Patient was found to have creatinine of 3.3 and BUN of 97. Recent history of admission for non-STEMI.  He apparently was placed on 3 blood pressure medications at that time.  His lactic acid was about 2.  His potassium was 5.5. Patient was found to have low blood pressures and had to be placed on IV fluids and pressors.  Currently he is admitted to the ICU with septic shock. Echocardiogram done today revealed possible vegetation versus calcium  on the anterior mitral valve leaflet. 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  He has had infection involving implantable cardioverter-defibrillator.   Assessment:  Sepsis,an ongoing issue. Septic shock, on pressors. Altered mental status, ongoing. Lactic acidosis. Continues on IV  ceftriaxone  and linezolid . He has left AICD. Left foot stump appears quite clean. ?Mitral valve endocarditis. History of CAD, status post PCI, TAVR with bioprosthetic endocarditis on suppressive amoxicillin . 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 History of infection involving implantable cardioverter defibrillator. AKI.  Plan: Will follow-up on the blood cultures. Continue ceftriaxone  2 g daily as well as linezolid  600 mg twice daily.  Monitor ESR, CRP. Will get x-rays of the left foot. Patient needs TEE.   Principal Problem:   Shock (HCC)    apixaban   2.5 mg Oral BID   aspirin   81 mg Oral Daily   Chlorhexidine  Gluconate Cloth  6 each Topical Daily   gabapentin   100 mg Oral TID   insulin  aspart  0-5 Units Subcutaneous QHS   insulin  aspart  0-9 Units Subcutaneous TID WC   lidocaine   1 patch Transdermal Q24H   nystatin    Topical BID    HPI: 83 year old male with past medical history of COPD, CHF, cardiomyopathy, diabetes mellitus, hypertension was brought in from home for concern of generalized weakness. Patient apparently was quite confused at home.  Patient was noted to have laceration to the left leg.  Patient has been complaining of progressive neuropathy as per the wife since Sunday that is 03/07/2024. 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  He also has chronic pain and is on opioids. The patient also noticed that the patient has had increasing confusion and was even finding it difficult to get to the bedside commode. Usually he is quite independent.  Patient also had decreased urinary output and urinated only once or twice per day.  Patient was found to have some shaking episodes and there was some difficulty coordinating his hand to his mouth while eating.  Patient had decreased p.o. intake. Patient was found to have creatinine of 3.3 and BUN of  97. Recent history of admission for non-STEMI.  He apparently was placed on 3 blood pressure medications at that time.  His lactic acid was about 2.  His potassium was 5.5. Patient was found to have low blood pressures and had to be placed on IV fluids and pressors.  Currently he is admitted to the ICU with septic shock. Echocardiogram done today revealed possible vegetation versus calcium  on the anterior mitral valve leaflet.   Review of Systems: Review of Systems  Constitutional:  Positive for malaise/fatigue.       Patient appears very weak and tired.  He appears confused.  HENT: Negative.    Respiratory:  Positive for shortness of breath.   Gastrointestinal: Negative.   Genitourinary: Negative.   Musculoskeletal: Negative.   Neurological:        Patient has altered mental status and appears lethargic.    Past Medical History:  Diagnosis Date   AICD (automatic cardioverter/defibrillator) present    Balanitis    +severe phimosis+ buried penis->circ not possible but dorsal slit done 10/2019   BPH (benign prostatic hypertrophy)    with urinary retention.  Renal u/s 12/04/19 NORMAL KIDNEYS, NORMAL BLADDER, NO HYDRONEPHROSIS   CAD (coronary artery disease)    Nonobstructive by 12/2012 cath; then 03/2016 he required BMS to RCA ([facility]).  In-stent restenosis on cath 11/01/16, baloon angioplasty successful.   CATARACT, HX OF    Chronic combined systolic and diastolic CHF (congestive heart failure) (HCC) 05/2016   Ischemic CM.  04/2018 EF 40-45%, grd I DD.  07/2021 EF 55-60%, AV well seated   Chronic pain syndrome    Lumbar DDD; chronic neuropathic pain (DM); has spinal stimulator and sees pain mgmt MD   Complicated UTI (urinary tract infection) 09/2019   Phimoses, acute urinary retention, entoerococcus UTI, enterococc bacteremia.  F/u blood clx's neg x 5d.     COPD    Debilitated patient    Diabetes mellitus type 2 with complications (HCC)    HbA1c jumped from 5.7% to 6.1% 03/2015---started  metformin  at that time.  DM 2 dx by fasting gluc criteria 2018.  Has chronic neuropath pain   Enterococcal bacteremia 09/2019   Phimoses, acute urinary retention, entoerococcus UTI, enterococc bacteremia.  F/u blood clx's neg x 5d.     Essential hypertension, benign    Fatty liver 2007   2007 u/s showed fatty liver with hepatosplenomegaly.  2019 repeat u/s->fatty liver but no cirrhosis or hepatosplenomegaly.   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 Fleeta Rothman)   Iron deficiency anemia    09/2022.  Hemoccults x 3 neg Mar 2024.  Improving with oral iron. GI following, no endoscopies   Lumbosacral neuritis    Lumbosacral spondylosis    Lumbar spinal stenosis with neurogenic claudication--contributes to his chronic pain syndrome   Morbid  obesity (HCC)    Normal memory function 08/2014   Neuropsychological testing ([facility]): no cognitive impairment or sign of neurodegenerative disorder.  Likely has adjustment d/o with mixed anxiety/depressed features and may benefit from low dose SNRI.     Normocytic anemia 03/2016   Mild-pt needs ferritin and vit B12 level checked (as of 03/22/16). Hb stable 09/2019.  Hemoccult x 3 NEG 03/2022.  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 chronic?) Off anticoag for a while due to falls.  Then apixaban  started 12/2014.    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 Clorinda): neuropathic pain->diff to treat, failed nucynta , failed spinal stimul trial, oxycontin  hs + tramadol  + gabap as of 12/2017 f/u Dr. Carilyn.   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   Sick sinus syndrome (HCC)    PPM placed   Thrombocytopenia 2018   HSM on 2007 abd u/s---suspect some mild splenic sequestration chronically.   Unspecified glaucoma(365.9)    Unspecified hereditary and idiopathic peripheral neuropathy approx age 89   bilat LE's, ? left arm, too.  Feet became progressively numb + left foot pain intermittently.  Pt may be trying a spinal stimulator (as of 05/2015)   Vaccine counseling 11/16/2021   VENOUS INSUFFICIENCY    Being followed by Dr. Harden as of 10/2016 for two R LL venous stasis ulcers/skin tears.  Healed as of 10/30/16 f/u with Dr. Harden.   Venous stasis dermatitis 12/27/2021   VENTRAL HERNIA     Social History   Tobacco Use   Smoking status: Never    Passive exposure: Never   Smokeless tobacco: Never  Vaping Use   Vaping status: Never Used  Substance Use Topics   Alcohol  use: No    Alcohol /week: 0.0 standard drinks of alcohol    Drug use: No    Types: Oxycodone     Family History  Problem Relation Age  of Onset   Hypertension Mother    Coronary artery disease Mother    Heart attack Mother    Neuropathy Mother    Pulmonary fibrosis Father        asbestosis   Allergies  Allergen Reactions   Brimonidine Tartrate Shortness Of Breath    Alphagan-Shortness of breath   Brinzolamide Shortness Of Breath    AZOPT- Shortness of breath   Latanoprost Shortness Of Breath    XALATAN- Shortness of breath   Nucynta  [Tapentadol ] Shortness Of Breath  Sulfa Antibiotics Palpitations   Timolol Maleate Shortness Of Breath and Other (See Comments)    TIMOPTIC- Aggravated asthma   Diltiazem Swelling     leg swelling   Rofecoxib Swelling     VIOXX- leg swelling   Vancomycin Rash and Other (See Comments)    Blisters   Codeine Other (See Comments)    Childhood reaction   Tamsulosin Other (See Comments)    Dizziness    Celecoxib Other (See Comments)    CELLBREX-confusion   Colchicine Diarrhea    diarrhea   Tape Rash    OBJECTIVE: Blood pressure 130/69, pulse 97, temperature 98.5 F (36.9 C), temperature source Oral, resp. rate 13, height 6' 4 (1.93 m), weight 117.1 kg, SpO2 95%.  Physical Exam Constitutional:      Appearance: He is ill-appearing.  HENT:     Head: Normocephalic and atraumatic.     Nose: Nose normal.     Mouth/Throat:     Mouth: Mucous membranes are moist.  Musculoskeletal:     Right lower leg: Edema present.     Left lower leg: Edema present.     Lab Results Lab Results  Component Value Date   WBC 10.2 04/11/2024   HGB 11.7 (L) 04/11/2024   HCT 33.3 (L) 04/11/2024   MCV 93.5 04/11/2024   PLT 148 (L) 04/11/2024    Lab Results  Component Value Date   CREATININE 2.23 (H) 04/11/2024   BUN 63 (H) 04/11/2024   NA 133 (L) 04/11/2024   K 3.8 04/11/2024   CL 95 (L) 04/11/2024   CO2 23 04/11/2024    Lab Results  Component Value Date   ALT 36 04/10/2024   AST 75 (H) 04/10/2024   ALKPHOS 90 04/10/2024   BILITOT 0.9 04/10/2024     Microbiology: Recent  Results (from the past 240 hours)  Blood culture (routine x 2)     Status: None (Preliminary result)   Collection Time: 04/10/24  5:48 AM   Specimen: BLOOD RIGHT ARM  Result Value Ref Range Status   Specimen Description BLOOD RIGHT ARM  Final   Special Requests   Final    BOTTLES DRAWN AEROBIC AND ANAEROBIC Blood Culture adequate volume   Culture   Final    NO GROWTH 1 DAY Performed at [facility], [address] [place], [place] [address]    Report Status PENDING  Incomplete  Blood culture (routine x 2)     Status: None (Preliminary result)   Collection Time: 04/10/24  6:18 AM   Specimen: BLOOD  Result Value Ref Range Status   Specimen Description BLOOD SITE NOT SPECIFIED  Final   Special Requests   Final    BOTTLES DRAWN AEROBIC AND ANAEROBIC Blood Culture adequate volume   Culture   Final    NO GROWTH 1 DAY Performed at [name], [address] [place], [place] [address]    Report Status PENDING  Incomplete  MRSA Next Gen by PCR, Nasal     Status: None   Collection Time: 04/10/24  9:42 AM   Specimen: Nasal Mucosa; Nasal Swab  Result Value Ref Range Status   MRSA by PCR Next Gen NOT DETECTED NOT DETECTED Final    Comment: (NOTE) The GeneXpert MRSA Assay (FDA approved for NASAL specimens only), is one component of a comprehensive MRSA colonization surveillance program. It is not intended to diagnose MRSA infection nor to guide or monitor treatment for MRSA infections. Test performance is not  FDA approved in patients less than 76 years old. Performed at [facility], [address], [place], [place] [address]   Resp panel by RT-PCR (RSV, Flu A&B, Covid) Nasal Mucosa     Status: None   Collection Time: 04/10/24  9:42 AM   Specimen: Nasal Mucosa; Nasal Swab  Result Value Ref Range Status   SARS Coronavirus 2 by RT PCR NEGATIVE NEGATIVE Final   Influenza A by PCR NEGATIVE NEGATIVE Final   Influenza B by PCR NEGATIVE NEGATIVE Final    Comment:  (NOTE) The Xpert Xpress SARS-CoV-2/FLU/RSV plus assay is intended as an aid in the diagnosis of influenza from Nasopharyngeal swab specimens and should not be used as a sole basis for treatment. Nasal washings and aspirates are unacceptable for Xpert Xpress SARS-CoV-2/FLU/RSV testing.  Fact Sheet for Patients: BloggerCourse.com  Fact Sheet for Healthcare Providers: SeriousBroker.it  This test is not yet approved or cleared by the [place]  FDA and has been authorized for detection and/or diagnosis of SARS-CoV-2 by FDA under an Emergency Use Authorization (EUA). This EUA will remain in effect (meaning this test can be used) for the duration of the COVID-19 declaration under Section 564(b)(1) of the Act, 21 U.S.C. section 360bbb-3(b)(1), unless the authorization is terminated or revoked.     Resp Syncytial Virus by PCR NEGATIVE NEGATIVE Final    Comment: (NOTE) Fact Sheet for Patients: BloggerCourse.com  Fact Sheet for Healthcare Providers: SeriousBroker.it  This test is not yet approved or cleared by the [place]  FDA and has been authorized for detection and/or diagnosis of SARS-CoV-2 by FDA under an Emergency Use Authorization (EUA). This EUA will remain in effect (meaning this test can be used) for the duration of the COVID-19 declaration under Section 564(b)(1) of the Act, 21 U.S.C. section 360bbb-3(b)(1), unless the authorization is terminated or revoked.  Performed at [facility], [address] [place], [place] [address]   ECHOCARDIOGRAM COMPLETE Result Date: 04/10/2024    ECHOCARDIOGRAM REPORT   Patient Name:   Tim [name] Date of Exam: 04/10/2024 Medical Rec #:  [id number]       Height:       76.0 in Accession #:    [id number]      Weight:       262.1 lb Date of Birth:  [birth year]       BSA:          2.485 m Patient Age:    83 years        BP:            94/63 mmHg Patient Gender: M               HR:           89 bpm. Exam Location:  Inpatient Procedure: 2D Echo, Cardiac Doppler, Color Doppler and Intracardiac            Opacification Agent (Both Spectral and Color Flow Doppler were            utilized during procedure). Indications:    Shock R57.9  History:        Patient has prior history of Echocardiogram examinations, most                 recent 04/29/2020. CHF, CAD and Previous Myocardial Infarction,                 Defibrillator, COPD and Pulmonary HTN, Arrythmias:Atrial  Fibrillation, Signs/Symptoms:Edema, Dyspnea and Murmur; Risk                 Factors:Sleep Apnea, Hypertension and Diabetes. H/O Shock,                 Aortic stenosis S/P TAVR, Hypotension, Mitral valve vegitation,                 Prosthetic valve endocarditis, Small PFO.  Sonographer:    BERNARDA ROCKS Referring Phys: [id number] JOSHUA C SMITH IMPRESSIONS  1. Left ventricular ejection fraction, by estimation, is 65 to 70%. The left ventricle has normal function. The left ventricle has no regional wall motion abnormalities. There is mild concentric left ventricular hypertrophy. Left ventricular diastolic parameters are indeterminate.  2. Right ventricular systolic function is normal. The right ventricular size is normal.  3. Left atrial size was moderately dilated.  4. Possible vegetation versus mobile calcium  on anterior leaflet. The mitral valve is abnormal. No evidence of mitral valve regurgitation. Mild mitral stenosis. Severe mitral annular calcification.  5. The aortic valve is abnormal. There is severe calcifcation of the aortic valve. Aortic valve regurgitation is not visualized. Severe aortic valve stenosis. Aortic valve area, by VTI measures 0.76 cm. Aortic valve mean gradient measures 46.0 mmHg. Aortic valve Vmax measures 4.58 m/s.  6. The inferior vena cava is normal in size with greater than 50% respiratory variability, suggesting right atrial pressure of 3 mmHg.  Conclusion(s)/Recommendation(s): Technically very difficult study due to poor sound wave transmission. FINDINGS  Left Ventricle: Left ventricular ejection fraction, by estimation, is 65 to 70%. The left ventricle has normal function. The left ventricle has no regional wall motion abnormalities. Definity  contrast agent was given IV to delineate the left ventricular  endocardial borders. The left ventricular internal cavity size was normal in size. There is mild concentric left ventricular hypertrophy. Left ventricular diastolic parameters are indeterminate. Right Ventricle: The right ventricular size is normal. No increase in right ventricular wall thickness. Right ventricular systolic function is normal. Left Atrium: Left atrial size was moderately dilated. Right Atrium: Right atrial size was normal in size. Pericardium: There is no evidence of pericardial effusion. Mitral Valve: Possible vegetation versus mobile calcium  on anterior leaflet. The mitral valve is abnormal. There is mild calcification of the mitral valve leaflet(s). Severe mitral annular calcification. No evidence of mitral valve regurgitation. Mild mitral valve stenosis. MV peak gradient, 11.7 mmHg. The mean mitral valve gradient is 5.0 mmHg. Tricuspid Valve: The tricuspid valve is not well visualized. Tricuspid valve regurgitation is not demonstrated. No evidence of tricuspid stenosis. Aortic Valve: The aortic valve is abnormal. There is severe calcifcation of the aortic valve. Aortic valve regurgitation is not visualized. Severe aortic stenosis is present. Aortic valve mean gradient measures 46.0 mmHg. Aortic valve peak gradient measures 83.9 mmHg. Aortic valve area, by VTI measures 0.76 cm. 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 Pulmonic Valve: The pulmonic valve was not well visualized. Pulmonic valve regurgitation is not visualized. No evidence of pulmonic stenosis. Aorta: The aortic root  is normal in size and structure. Venous: The inferior vena cava was not well visualized. The inferior vena cava is normal in size with greater than 50% respiratory variability, suggesting right atrial pressure of 3 mmHg. IAS/Shunts: No atrial level shunt detected by color flow Doppler. Additional Comments: A device lead is visualized.  LEFT VENTRICLE PLAX 2D LVIDd:         2.80 cm  Diastology LVIDs:         1.70 cm      LV e' medial:    10.30 cm/s LV PW:         1.10 cm      LV E/e' medial:  11.2 LV IVS:        1.10 cm      LV e' lateral:   8.05 cm/s LVOT diam:     1.70 cm      LV E/e' lateral: 14.3 LV SV:         67 LV SV Index:   27 LVOT Area:     2.27 cm  LV Volumes (MOD) LV vol d, MOD A2C: 180.0 ml LV vol d, MOD A4C: 141.0 ml LV vol s, MOD A2C: 55.3 ml LV vol s, MOD A4C: 41.6 ml LV SV MOD A2C:     124.7 ml LV SV MOD A4C:     141.0 ml LV SV MOD BP:      116.7 ml RIGHT VENTRICLE             IVC RV Basal diam:  3.85 cm     IVC diam: 1.30 cm RV S prime:     23.50 cm/s TAPSE (M-mode): 3.3 cm LEFT ATRIUM             Index        RIGHT ATRIUM           Index LA diam:        4.70 cm 1.89 cm/m   RA Area:     17.60 cm LA Vol (A2C):   99.7 ml 40.12 ml/m  RA Volume:   47.40 ml  19.07 ml/m LA Vol (A4C):   60.5 ml 24.34 ml/m LA Biplane Vol: 81.4 ml 32.75 ml/m  AORTIC VALVE                     PULMONIC VALVE AV Area (Vmax):    0.72 cm      PV Vmax:          0.90 m/s AV Area (Vmean):   0.66 cm      PV Peak grad:     3.2 mmHg AV Area (VTI):     0.76 cm      PR End Diast Vel: 1.99 msec AV Vmax:           458.00 cm/s AV Vmean:          319.000 cm/s AV VTI:            0.886 m AV Peak Grad:      83.9 mmHg AV Mean Grad:      46.0 mmHg LVOT Vmax:         145.00 cm/s LVOT Vmean:        93.050 cm/s LVOT VTI:          0.297 m LVOT/AV VTI ratio: 0.34  AORTA Ao Root diam: 2.30 cm MITRAL VALVE MV Area (PHT): 4.46 cm     SHUNTS MV Area VTI:   1.56 cm     Systemic VTI:  0.30 m MV Peak grad:  11.7 mmHg    Systemic Diam: 1.70 cm  MV Mean grad:  5.0 mmHg MV Vmax:       1.71 m/s MV Vmean:      98.4 cm/s MV Decel Time: 170 msec MV E velocity: 115.00 cm/s MV A velocity: 167.00 cm/s MV E/A ratio:  0.69 Daniel Bensimhon  MD Electronically signed by Toribio Fuel MD Signature Date/Time: 04/10/2024/3:46:19 PM    Final    CT HEAD WO CONTRAST Result Date: 04/10/2024 EXAM: CT HEAD WITHOUT CONTRAST 04/10/2024 06:58:57 AM TECHNIQUE: CT of the head was performed without the administration of intravenous contrast. Automated exposure control, iterative reconstruction, and/or weight based adjustment of the mA/kV was utilized to reduce the radiation dose to as low as reasonably achievable. COMPARISON: Head CT 12/05/2019. CLINICAL HISTORY: 83 year old male. Mental status change, unknown cause. History of leg laceration without fall, treated, discharged, and having confusion since. FINDINGS: BRAIN AND VENTRICLES: No acute hemorrhage. No evidence of acute infarct. No hydrocephalus. No extra-axial collection. No mass effect or midline shift. Cerebral volume is stable and within normal limits for age. 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 Otherwise gray white differentiation within normal limits for age. Calcified atherosclerosis at the skull base. No suspicious intracranial vascular hyperdensity. ORBITS: Postoperative changes to both orbits. SINUSES: Chronic retained secretions and or polyp extending from the posterior right nasal cavity into the nasopharynx, but considerably smaller since 2021. SOFT TISSUES AND SKULL: Mild motion artifact at the skull base. No skull fracture. No acute scalp soft tissue injury identified. IMPRESSION: 1. No acute intracranial abnormality. Small chronic right cerebellar infarcts. Electronically signed by: Helayne Hurst MD 04/10/2024 07:10 AM EDT RP Workstation: [id number]   DG Chest Portable 1 View Result Date: 04/10/2024 EXAM: 1 VIEW(S) XRAY OF THE CHEST 04/10/2024 06:23:00 AM COMPARISON: Portable chest  04/28/2020, chest CT 12/25/2012. CLINICAL HISTORY: 83 year old male. Hypotension. Encounter for hypotension. 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 LUNGS AND PLEURA: Normal lung volumes. Allowing for portable technique both lungs appear clear. No focal pulmonary opacity. No pulmonary edema. No pleural effusion. No pneumothorax. HEART AND MEDIASTINUM: Previous TAVR. Heart size and mediastinal contours within normal limits. BONES AND SOFT TISSUES: No acute osseous abnormality. IMPRESSION: 1. No acute cardiopulmonary process. Electronically signed by: Helayne Hurst MD 04/10/2024 06:34 AM EDT RP Workstation: [id number]   XR Ankle 2 Views Right Result Date: 03/04/2024 Radiographs of right ankle show degenerative changes. Mortise is congruent. Plantar and posterior calcaneal spurring present. No fracture.  XR Foot 2 Views Right Result Date: 03/04/2024 Radiographs of right foot with degenerative changes present. Significant loss of joint space with spurring to first metatarsophalangeal joint and interphalangeal joint of great toe. No acute finding.    Dr. Zoanne, MD [name] Telephone [phone]  04/11/2024 10:56 AM    Total Encounter Time: 80 minutes.

## 2024-04-12 ENCOUNTER — Inpatient Hospital Stay (HOSPITAL_COMMUNITY)

## 2024-04-12 DIAGNOSIS — A419 Sepsis, unspecified organism: Secondary | ICD-10-CM | POA: Diagnosis not present

## 2024-04-12 DIAGNOSIS — Z95810 Presence of automatic (implantable) cardiac defibrillator: Secondary | ICD-10-CM | POA: Diagnosis not present

## 2024-04-12 DIAGNOSIS — Z79899 Other long term (current) drug therapy: Secondary | ICD-10-CM

## 2024-04-12 DIAGNOSIS — R579 Shock, unspecified: Secondary | ICD-10-CM | POA: Diagnosis not present

## 2024-04-12 DIAGNOSIS — R410 Disorientation, unspecified: Secondary | ICD-10-CM

## 2024-04-12 DIAGNOSIS — N19 Unspecified kidney failure: Secondary | ICD-10-CM

## 2024-04-12 DIAGNOSIS — N179 Acute kidney failure, unspecified: Principal | ICD-10-CM

## 2024-04-12 DIAGNOSIS — R319 Hematuria, unspecified: Secondary | ICD-10-CM

## 2024-04-12 LAB — CBC WITH DIFFERENTIAL/PLATELET
Abs Immature Granulocytes: 0.05 K/uL (ref 0.00–0.07)
Basophils Absolute: 0 K/uL (ref 0.0–0.1)
Basophils Relative: 0 %
Eosinophils Absolute: 0.1 K/uL (ref 0.0–0.5)
Eosinophils Relative: 1 %
HCT: 29.9 % — ABNORMAL LOW (ref 39.0–52.0)
Hemoglobin: 10.4 g/dL — ABNORMAL LOW (ref 13.0–17.0)
Immature Granulocytes: 1 %
Lymphocytes Relative: 16 %
Lymphs Abs: 1.6 K/uL (ref 0.7–4.0)
MCH: 33 pg (ref 26.0–34.0)
MCHC: 34.8 g/dL (ref 30.0–36.0)
MCV: 94.9 fL (ref 80.0–100.0)
Monocytes Absolute: 1 K/uL (ref 0.1–1.0)
Monocytes Relative: 10 %
Neutro Abs: 7.1 K/uL (ref 1.7–7.7)
Neutrophils Relative %: 72 %
Platelets: 134 K/uL — ABNORMAL LOW (ref 150–400)
RBC: 3.15 MIL/uL — ABNORMAL LOW (ref 4.22–5.81)
RDW: 14.2 % (ref 11.5–15.5)
WBC: 9.8 K/uL (ref 4.0–10.5)
nRBC: 0 % (ref 0.0–0.2)

## 2024-04-12 LAB — BASIC METABOLIC PANEL WITH GFR
Anion gap: 10 (ref 5–15)
BUN: 45 mg/dL — ABNORMAL HIGH (ref 8–23)
CO2: 29 mmol/L (ref 22–32)
Calcium: 8.7 mg/dL — ABNORMAL LOW (ref 8.9–10.3)
Chloride: 95 mmol/L — ABNORMAL LOW (ref 98–111)
Creatinine, Ser: 1.54 mg/dL — ABNORMAL HIGH (ref 0.61–1.24)
GFR, Estimated: 44 mL/min — ABNORMAL LOW (ref >60)
Glucose, Bld: 143 mg/dL — ABNORMAL HIGH (ref 70–99)
Potassium: 4 mmol/L (ref 3.5–5.1)
Sodium: 134 mmol/L — ABNORMAL LOW (ref 135–145)

## 2024-04-12 LAB — C-REACTIVE PROTEIN: CRP: <0.5 mg/dL (ref <1.0)

## 2024-04-12 LAB — GLUCOSE, CAPILLARY
Glucose-Capillary: 116 mg/dL — ABNORMAL HIGH (ref 70–99)
Glucose-Capillary: 136 mg/dL — ABNORMAL HIGH (ref 70–99)
Glucose-Capillary: 147 mg/dL — ABNORMAL HIGH (ref 70–99)
Glucose-Capillary: 147 mg/dL — ABNORMAL HIGH (ref 70–99)
Glucose-Capillary: 154 mg/dL — ABNORMAL HIGH (ref 70–99)
Glucose-Capillary: 191 mg/dL — ABNORMAL HIGH (ref 70–99)

## 2024-04-12 LAB — MAGNESIUM: Magnesium: 2 mg/dL (ref 1.7–2.4)

## 2024-04-12 LAB — SEDIMENTATION RATE: Sed Rate: 12 mm/h (ref 0–16)

## 2024-04-12 MED ORDER — BOOST / RESOURCE BREEZE PO LIQD CUSTOM
1.0000 | Freq: Three times a day (TID) | ORAL | Status: DC
  Administered 2024-04-12 – 2024-04-18 (×13): 1 via ORAL

## 2024-04-12 NOTE — Progress Notes (Signed)
 Pt complains for Left leg pain and CCM called to bedside and this RN assessed and no signs of trauma or b/l differences

## 2024-04-12 NOTE — Consult Note (Signed)
 Urology Consult   Physician requesting consult: Dr. Gatha  Reason for consult: hematuria, on Eliquis    History of Present Illness: Tim [name] is a 83 y.o. 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 (In hospital for shock and cardiac problems including severe prosthetic valve stenosis and possible mitral valve vegetation.)   On evaluation, patient is AF with some soft Bps. Labs notable for elevated Cr to 1.5 (downtrended from 3 at admission, baseline appears ~1). Hgb 10.4 (from 11.7 the day prior). Condom catheter in place draining dark brown urine, no clot and very thin. Denies difficulty with urination. No previous history of gross hematuria prior to hospitalization.   On Eliquis  for Afib. Per chart review, very distant history of DVT (2010) and was previously on Coumadin.   Was seen by Dr. Alvaro in 2021 for severe phimosis s/p dorsal slit. At that time, prostate measured approximately 100g.   Past Medical History:  Diagnosis Date   AICD (automatic cardioverter/defibrillator) present    Balanitis    +severe phimosis+ buried penis->circ not possible but dorsal slit done 10/2019   BPH (benign prostatic hypertrophy)    with urinary retention.  Renal u/s 12/04/19 NORMAL KIDNEYS, NORMAL BLADDER, NO HYDRONEPHROSIS   CAD (coronary artery disease)    Nonobstructive by 12/2012 cath; then 03/2016 he required BMS to RCA ([facility]).  In-stent restenosis on cath 11/01/16, baloon angioplasty successful.   CATARACT, HX OF    Chronic combined systolic and diastolic CHF (congestive heart failure) (HCC) 05/2016   Ischemic CM.  04/2018 EF 40-45%, grd I DD.  07/2021 EF 55-60%, AV well seated   Chronic pain syndrome    Lumbar DDD; chronic neuropathic pain (DM); has spinal stimulator and sees pain mgmt MD   Complicated UTI (urinary tract infection) 09/2019    Phimoses, acute urinary retention, entoerococcus UTI, enterococc bacteremia.  F/u blood clx's neg x 5d.     COPD    Debilitated patient    Diabetes mellitus type 2 with complications (HCC)    HbA1c jumped from 5.7% to 6.1% 03/2015---started metformin  at that time.  DM 2 dx by fasting gluc criteria 2018.  Has chronic neuropath pain   Enterococcal bacteremia 09/2019   Phimoses, acute urinary retention, entoerococcus UTI, enterococc bacteremia.  F/u blood clx's neg x 5d.     Essential hypertension, benign    Fatty liver 2007   2007 u/s showed fatty liver with hepatosplenomegaly.  2019 repeat u/s->fatty liver but no cirrhosis or hepatosplenomegaly.   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 Fleeta Rothman)   Iron deficiency anemia    09/2022.  Hemoccults x 3 neg Mar 2024.  Improving with oral iron. GI following, no endoscopies   Lumbosacral neuritis    Lumbosacral spondylosis    Lumbar spinal stenosis with neurogenic claudication--contributes to his chronic pain syndrome   Morbid obesity (HCC)    Normal memory function 08/2014   Neuropsychological testing ([facility]): no cognitive impairment or sign of neurodegenerative disorder.  Likely has adjustment d/o with mixed anxiety/depressed features and may benefit from low dose SNRI.     Normocytic anemia 03/2016   Mild-pt needs ferritin and vit B12 level checked (as of 03/22/16). Hb stable 09/2019.  Hemoccult x 3 NEG 03/2022.  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 chronic?) Off anticoag for a while due to falls.  Then apixaban  started 12/2014.   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 Clorinda): neuropathic pain->diff to treat, failed nucynta , failed spinal stimul trial, oxycontin  hs + tramadol  + gabap as of 12/2017 f/u Dr. Carilyn.   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   Sick sinus syndrome (HCC)    PPM placed   Thrombocytopenia 2018   HSM on 2007 abd u/s---suspect some mild splenic sequestration chronically.   Unspecified glaucoma(365.9)    Unspecified hereditary and idiopathic peripheral neuropathy approx age 73   bilat LE's, ? left arm, too.  Feet became progressively numb + left foot pain intermittently.  Pt may be trying a spinal stimulator (as of 05/2015)   Vaccine counseling 11/16/2021   VENOUS INSUFFICIENCY    Being followed by Dr. Harden as of 10/2016 for two R LL venous stasis ulcers/skin tears.  Healed as of 10/30/16 f/u with Dr. Harden.   Venous stasis dermatitis 12/27/2021   VENTRAL HERNIA     Past Surgical History:  Procedure Laterality Date   AMPUTATION Left  04/11/2013   Procedure: AMPUTATION DIGIT Left 3rd toe;  Surgeon: Jerona LULLA Harden, MD;  Location: [facility] OR;  Service: Orthopedics;  Laterality: Left;  Left 3rd toe amputation at [facility]   AMPUTATION Left 08/29/2019   Procedure: LEFT TRANSMETATARSAL AMPUTATION;  Surgeon: Harden Jerona LULLA, MD;  Location: [facility] OR;  Service: Orthopedics;  Laterality: Left;   BIOPSY  05/04/2020   Procedure: BIOPSY;  Surgeon: Charlanne Groom, MD;  Location: [name] ENDOSCOPY;  Service: Endoscopy;;   CARDIAC CATHETERIZATION  1997; 03/10/16   1997 Non-obstructive disease.  03/2016 BMS to RCA, with 25% pDiag dz, o/w normal cors per cath 03/07/16.  Cath 11/01/16: in stent restenosis, successful baloon angioplasty. 08/2020 branch vessel dz, cont medical therapy rec'd.   CARDIAC CATHETERIZATION  12/24/2012   mild < 20% LCx, prox 30% RCA; LVEF 55-65% , moderate pulmonary HTN, moderate AS   CARDIAC DEFIBRILLATOR PLACEMENT  11/07/2017   [facility] MRI Quad CRT defibrillator   CARDIOVASCULAR STRESS TEST  05/11/16 ([facility])   2017 Myocardial  perfusion imaging:  No ischemia; scar in apex, global hypokinesis, EF 36%.  06/15/20->mod primarily fixed inferolat wall defect mildly worse with stress c/w infarct/scar with mild peri-infarct ischemia, normal EF-->for cath per cards.   Carotid dopplers  03/09/2016   [facility]: no hemodynamically significant stenosis on either side.   CHOLECYSTECTOMY     COLONOSCOPY [name] 10/30/2012   Procedure: COLONOSCOPY;  Surgeon: Lupita FORBES Commander, MD;  Location: [facility] ENDOSCOPY;  Service: Endoscopy;  Laterality: [name];   CORONARY ANGIOPLASTY WITH STENT PLACEMENT  03/2016; 04/2017   2017-[facility]: BMS to RCA-pt was placed on Brilinta.  04/2017: DES to RCA.   DORSAL SLIT [name] 10/29/2019   for severe phimosis. Procedure: DORSAL SLIT;  Surgeon: Alvaro Hummer, MD;  Location: [facility] ORS;  Service: Urology;  Laterality: [name];  45 MINS   ESOPHAGEAL DILATION  05/04/2020   Procedure: ESOPHAGEAL DILATION;  Surgeon: Charlanne Groom, MD;  Location: [facility] ENDOSCOPY;   Service: Endoscopy;;   ESOPHAGOGASTRODUODENOSCOPY (EGD) WITH PROPOFOL  [name] 05/04/2020   Procedure: ESOPHAGOGASTRODUODENOSCOPY (EGD) WITH PROPOFOL ;  Surgeon: Charlanne Groom, MD;  Location: [facility] ENDOSCOPY;  Service: Endoscopy;  Laterality: [name];   EYE SURGERY Bilateral cataract   HEMORRHOID SURGERY     INTRAOCULAR LENS INSERTION Bilateral    KNEE SURGERY Right    LEFT AND RIGHT HEART CATHETERIZATION WITH CORONARY ANGIOGRAM [name] 12/24/2012   Procedure: LEFT AND RIGHT HEART CATHETERIZATION WITH CORONARY ANGIOGRAM;  Surgeon: Peter M Swaziland, MD;  Location: [facility] CATH LAB;  Service: Cardiovascular;  Laterality: [name];   LEG SURGERY Bilateral    lenghtening    PACEMAKER PLACEMENT  04/13/2016   2nd deg HB after TAVR, pt had DC MDT PPM placed.   SHOULDER ARTHROSCOPY  08/30/2011   Procedure: ARTHROSCOPY SHOULDER;  Surgeon: Jerona LULLA Sage, MD;  Location: [name] OR;  Service: Orthopedics;  Laterality: Right;  Right Shoulder Arthroscopy, Debridement, and Decompression   SPINAL CORD STIMULATOR INSERTION [name] 09/10/2015   Procedure: LUMBAR SPINAL CORD STIMULATOR INSERTION;  Surgeon: Deward Fabian, MD;  Location: [facility] NEURO ORS;  Service: Neurosurgery;  Laterality: [name];   TEE WITHOUT CARDIOVERSION [name] 09/16/2019   Procedure: TRANSESOPHAGEAL ECHOCARDIOGRAM (TEE);  Surgeon: Raford Riggs, MD;  Location: [facility] ENDOSCOPY;  Service: Cardiovascular;  Laterality: [name];   TEE WITHOUT CARDIOVERSION [name] 12/02/2019   +vegetation on AVR and pacer lead in RV.  EF 55-60%, normal wall motion.  Valves function normal.  Procedure: TRANSESOPHAGEAL ECHOCARDIOGRAM (TEE);  Surgeon: Barbaraann Darryle Ned, MD;  Location: [facility] ENDOSCOPY;  Service: Cardiovascular;  Laterality: [name];   TOE AMPUTATION Left    due to osteomyelitis.  R big toe surg due to osteoarth   TONSILLECTOMY     traeculectomy Left    eye   TRANSCATHETER AORTIC VALVE REPLACEMENT, TRANSFEMORAL  04/11/2016   TRANSESOPHAGEAL ECHOCARDIOGRAM  03/09/2016; 09/2019   [facility]: EF 55-60%, PFO  seen with bi-directional shunting, no thrombus in appendage.  09/2019 ->no valvular vegetations. Small patent foramen ovale with predominantly left to right shunting across the interatrial septum.   TRANSTHORACIC ECHOCARDIOGRAM  01/2015; 01/2016; 05/18/16; 09/18/16, 05/2017, 08/2017   01/2015 No signif change in aortic stenosis (moderate).  01/2016 Severe LVH w/small LV cavity, EF 60-65%, grade I diast dysfxn.  05/2016 (s/p TAVR): EF 50-55%, grd I DD, biopros AV good.  08/2016--EF 50-55%, LV septal motion c/w conduction abnl, grd I DD,mild MS,bioprosth aortic valve well seated, w/trace AR. 05/2017 TTE EF 35%. 08/2017-EF 35%, mod diff hypokin LV, grd I DD, biopros AV good.    TRANSTHORACIC ECHOCARDIOGRAM  04/2018; 09/2019  04/2018: EF 40-45%, mod diffuse LV hypokin, grd I DD, bioprosth AV well seated, no AS or AR. 09/2019 EF 60-65%, grd I DD, valves fine, including bioprosth AV. 04/29/20 (tech diff) EF 55-60%, grd I DD, vegetation on MV.  07/2021 EF 55-60% (s/p upgrade to BiV PPM), AV well seated.   VITRECTOMY      Current Hospital Medications:  Home Meds:  No current facility-administered medications on file prior to encounter.   Current Outpatient Medications on File Prior to Encounter  Medication Sig Dispense Refill   albuterol  (VENTOLIN  HFA) 108 (90 Base) MCG/ACT inhaler TAKE 2 PUFFS BY MOUTH EVERY 6 HOURS AS NEEDED FOR WHEEZE OR SHORTNESS OF BREATH 18 each 0   allopurinol  (ZYLOPRIM ) 300 MG tablet Take 1 tablet (300 mg total) by mouth daily. with food (Patient taking differently: Take 300 mg by mouth every evening.) 90 tablet 1   amLODipine (NORVASC) 5 MG tablet Take 5 mg by mouth in the morning and at bedtime.     amoxicillin  (AMOXIL ) 500 MG capsule Take 2 capsules (1,000 mg total) by mouth 2 (two) times daily. 360 capsule 3   apixaban  (ELIQUIS ) 5 MG TABS tablet Take 5 mg by mouth 2 (two) times daily.     ASPIRIN  LOW DOSE 81 MG EC tablet Take 81 mg by mouth daily.     B Complex-C (B-COMPLEX WITH  VITAMIN C ) tablet Take 1 tablet by mouth daily. Take 1 tablet daily, 250mg      carvedilol (COREG) 3.125 MG tablet Take 3.125 mg by mouth 2 (two) times daily with a meal.     clotrimazole -betamethasone  (LOTRISONE ) cream Apply 1 Application topically daily.     ezetimibe  (ZETIA ) 10 MG tablet Take 1 tablet (10 mg total) by mouth daily. 30 tablet 1   ferrous gluconate  (FERGON) 324 MG tablet Take 1 tablet (324 mg total) by mouth 2 (two) times daily. 90 tablet 1   FIBER PO Take 5 capsules by mouth in the morning and at bedtime.     finasteride  (PROSCAR ) 5 MG tablet Take 1 tablet (5 mg total) by mouth daily. 90 tablet 0   fluticasone  (FLONASE ) 50 MCG/ACT nasal spray Place 2 sprays into both nostrils daily. (Patient taking differently: Place 2 sprays into both nostrils daily as needed for allergies.) 16 g 6   fluticasone -salmeterol (WIXELA INHUB) 250-50 MCG/ACT AEPB Inhale 1 puff into the lungs 2 (two) times daily. in the morning and at bedtime. 60 each 5   gabapentin  (NEURONTIN ) 800 MG tablet TAKE 1 TABLET BY MOUTH FOUR TIMES A DAY (Patient taking differently: Take 800 mg by mouth 2 (two) times daily.) 360 tablet 1   loratadine (CLARITIN) 10 MG tablet Take 10 mg by mouth daily.     losartan  (COZAAR ) 25 MG tablet Take 25 mg by mouth daily.     mometasone -formoterol  (DULERA ) 100-5 MCG/ACT AERO Inhale 2 puffs into the lungs as needed for wheezing or shortness of breath.     Multiple Vitamin (MULTIVITAMIN ADULT PO) Take 2 tablets by mouth daily.     naloxone  (NARCAN ) nasal spray 4 mg/0.1 mL One spray in nostril as needed for opoid overdose.  May repeat dose in 5 min if needed. (Patient taking differently: Place 0.4 mg into the nose as needed (OD).) 2 each 1   niacin  (NIASPAN ) 1000 MG CR tablet TAKE 1 TABLET BY MOUTH TWICE A DAY 180 tablet 0   nitroGLYCERIN  (NITROSTAT ) 0.4 MG SL tablet Place 0.4 mg under the tongue every 5 (five) minutes  as needed for chest pain.     Oxycodone  HCl 20 MG TABS 1 tab po bid prn  severe pain (Patient taking differently: Take 20 mg by mouth daily.) 45 tablet 0   pantoprazole  (PROTONIX ) 40 MG tablet Take 1 tablet (40 mg total) by mouth daily. (Patient taking differently: Take 40 mg by mouth 2 (two) times daily.) 180 tablet 1   potassium chloride  SA (KLOR-CON  M) 20 MEQ tablet Take 1 tablet (20 mEq total) by mouth daily. (Patient taking differently: Take 10 mEq by mouth 2 (two) times daily.) 90 tablet 1   ranolazine (RANEXA) 500 MG 12 hr tablet Take 500 mg by mouth 2 (two) times daily.     rosuvastatin  (CRESTOR ) 20 MG tablet Take 20 mg by mouth daily.     torsemide  (DEMADEX ) 20 MG tablet Take 1 tablet (20 mg total) by mouth 2 (two) times daily. (Patient taking differently: Take 20 mg by mouth daily.) 180 tablet 1   VITAMIN D, CHOLECALCIFEROL, PO Take 1 tablet by mouth daily.      diclofenac  (VOLTAREN ) 75 MG EC tablet Take 1 tablet (75 mg total) by mouth 2 (two) times daily. (Patient not taking: Reported on 04/10/2024) 60 tablet 0     Scheduled Meds:  Chlorhexidine  Gluconate Cloth  6 each Topical Daily   feeding supplement  1 Container Oral TID BM   gabapentin   100 mg Oral TID   insulin  aspart  0-5 Units Subcutaneous QHS   insulin  aspart  0-9 Units Subcutaneous TID WC   lidocaine   1 patch Transdermal Q24H   nystatin    Topical BID   Continuous Infusions:  cefTRIAXone  (ROCEPHIN )  IV 2 g (04/12/24 1057)   linezolid  (ZYVOX ) IV 600 mg (04/12/24 1102)   norepinephrine (LEVOPHED) Adult infusion 2 mcg/min (04/12/24 0900)   PRN Meds:.acetaminophen , docusate sodium , fentaNYL  (SUBLIMAZE ) injection, ondansetron  (ZOFRAN ) IV, mouth rinse, polyethylene glycol  Allergies:  Allergies  Allergen Reactions   Brimonidine Tartrate Shortness Of Breath    Alphagan-Shortness of breath   Brinzolamide Shortness Of Breath    AZOPT- Shortness of breath   Latanoprost Shortness Of Breath    XALATAN- Shortness of breath   Nucynta  [Tapentadol ] Shortness Of Breath   Sulfa Antibiotics  Palpitations   Timolol Maleate Shortness Of Breath and Other (See Comments)    TIMOPTIC- Aggravated asthma   Diltiazem Swelling     leg swelling   Rofecoxib Swelling     VIOXX- leg swelling   Vancomycin Rash and Other (See Comments)    Blisters   Codeine Other (See Comments)    Childhood reaction   Tamsulosin Other (See Comments)    Dizziness    Celecoxib Other (See Comments)    CELLBREX-confusion   Colchicine Diarrhea    diarrhea   Tape Rash    Family History  Problem Relation Age of Onset   Hypertension Mother    Coronary artery disease Mother    Heart attack Mother    Neuropathy Mother    Pulmonary fibrosis Father        asbestosis    Social History:  reports that he has never smoked. He has never been exposed to tobacco smoke. He has never used smokeless tobacco. He reports that he does not drink alcohol  and does not use drugs.  ROS: A complete review of systems was performed.  All systems are negative except for pertinent findings as noted.  Physical Exam:  Vital signs in last 24 hours: Temp:  [98.5 F (36.9 C)-99.4 F (  37.4 C)] 98.5 F (36.9 C) (10/11 0749) Pulse Rate:  [53-100] 53 (10/11 1300) Resp:  [11-29] 23 (10/11 1300) BP: (67-132)/(27-101) 111/61 (10/11 1300) SpO2:  [90 %-100 %] 99 % (10/11 1300) FiO2 (%):  [21 %] 21 % (10/10 2218) Weight:  [117.8 kg] 117.8 kg (10/11 0456) Constitutional:  Obese male. Alert and oriented, No acute distress.  Respiratory: Normal respiratory effort GI: Abdomen is soft, nontender, nondistended GU: condom catheter in place draining thin dark brown urine  Psychiatric: Normal mood and affect  Laboratory Data:  Recent Labs    04/10/24 0548 04/10/24 0601 04/11/24 0725 04/12/24 0244  WBC 9.0  --  10.2 9.8  HGB 11.9* 11.9* 11.7* 10.4*  HCT 35.0* 35.0* 33.3* 29.9*  PLT 153  --  148* 134*    Recent Labs    04/10/24 0548 04/10/24 0601 04/11/24 0725 04/12/24 0244  NA 130* 130* 133* 134*  K 4.9 5.5* 3.8 4.0  CL  91* 95* 95* 95*  GLUCOSE 144* 140* 173* 143*  BUN 83* 97* 63* 45*  CALCIUM  9.0  --  8.7* 8.7*  CREATININE 3.12* 3.30* 2.23* 1.54*     Results for orders placed or performed during the hospital encounter of 04/10/24 (from the past 24 hours)  Glucose, capillary     Status: Abnormal   Collection Time: 04/11/24  3:17 PM  Result Value Ref Range   Glucose-Capillary 162 (H) 70 - 99 mg/dL  Glucose, capillary     Status: Abnormal   Collection Time: 04/11/24  7:51 PM  Result Value Ref Range   Glucose-Capillary 183 (H) 70 - 99 mg/dL  CBC with Differential/Platelet     Status: Abnormal   Collection Time: 04/12/24  2:44 AM  Result Value Ref Range   WBC 9.8 4.0 - 10.5 K/uL   RBC 3.15 (L) 4.22 - 5.81 MIL/uL   Hemoglobin 10.4 (L) 13.0 - 17.0 g/dL   HCT 70.0 (L) 60.9 - 47.9 %   MCV 94.9 80.0 - 100.0 fL   MCH 33.0 26.0 - 34.0 pg   MCHC 34.8 30.0 - 36.0 g/dL   RDW 85.7 88.4 - 84.4 %   Platelets 134 (L) 150 - 400 K/uL   nRBC 0.0 0.0 - 0.2 %   Neutrophils Relative % 72 %   Neutro Abs 7.1 1.7 - 7.7 K/uL   Lymphocytes Relative 16 %   Lymphs Abs 1.6 0.7 - 4.0 K/uL   Monocytes Relative 10 %   Monocytes Absolute 1.0 0.1 - 1.0 K/uL   Eosinophils Relative 1 %   Eosinophils Absolute 0.1 0.0 - 0.5 K/uL   Basophils Relative 0 %   Basophils Absolute 0.0 0.0 - 0.1 K/uL   Immature Granulocytes 1 %   Abs Immature Granulocytes 0.05 0.00 - 0.07 K/uL  Basic metabolic panel     Status: Abnormal   Collection Time: 04/12/24  2:44 AM  Result Value Ref Range   Sodium 134 (L) 135 - 145 mmol/L   Potassium 4.0 3.5 - 5.1 mmol/L   Chloride 95 (L) 98 - 111 mmol/L   CO2 29 22 - 32 mmol/L   Glucose, Bld 143 (H) 70 - 99 mg/dL   BUN 45 (H) 8 - 23 mg/dL   Creatinine, Ser 8.45 (H) 0.61 - 1.24 mg/dL   Calcium  8.7 (L) 8.9 - 10.3 mg/dL   GFR, Estimated 44 (L) >60 mL/min   Anion gap 10 5 - 15  Magnesium      Status: None   Collection Time:  04/12/24  2:44 AM  Result Value Ref Range   Magnesium  2.0 1.7 - 2.4 mg/dL   Glucose, capillary     Status: Abnormal   Collection Time: 04/12/24  7:46 AM  Result Value Ref Range   Glucose-Capillary 147 (H) 70 - 99 mg/dL  Sedimentation rate     Status: None   Collection Time: 04/12/24 10:55 AM  Result Value Ref Range   Sed Rate 12 0 - 16 mm/hr  C-reactive protein     Status: None   Collection Time: 04/12/24 10:55 AM  Result Value Ref Range   CRP <0.5 <1.0 mg/dL  Glucose, capillary     Status: Abnormal   Collection Time: 04/12/24 11:30 AM  Result Value Ref Range   Glucose-Capillary 147 (H) 70 - 99 mg/dL   *Note: Due to a large number of results and/or encounters for the requested time period, some results have not been displayed. A complete set of results can be found in Results Review.   Recent Results (from the past 240 hours)  Blood culture (routine x 2)     Status: None (Preliminary result)   Collection Time: 04/10/24  5:48 AM   Specimen: BLOOD RIGHT ARM  Result Value Ref Range Status   Specimen Description BLOOD RIGHT ARM  Final   Special Requests   Final    BOTTLES DRAWN AEROBIC AND ANAEROBIC Blood Culture adequate volume   Culture   Final    NO GROWTH 2 DAYS Performed at [facility], [address] [place], [place] [address]    Report Status PENDING  Incomplete  Blood culture (routine x 2)     Status: None (Preliminary result)   Collection Time: 04/10/24  6:18 AM   Specimen: BLOOD  Result Value Ref Range Status   Specimen Description BLOOD SITE NOT SPECIFIED  Final   Special Requests   Final    BOTTLES DRAWN AEROBIC AND ANAEROBIC Blood Culture adequate volume   Culture   Final    NO GROWTH 2 DAYS Performed at [facility], [address], [place], [place] [address]    Report Status PENDING  Incomplete  MRSA Next Gen by PCR, Nasal     Status: None   Collection Time: 04/10/24  9:42 AM   Specimen: Nasal Mucosa; Nasal Swab  Result Value Ref Range Status   MRSA by PCR Next Gen NOT DETECTED NOT DETECTED Final    Comment:  (NOTE) The GeneXpert MRSA Assay (FDA approved for NASAL specimens only), is one component of a comprehensive MRSA colonization surveillance program. It is not intended to diagnose MRSA infection nor to guide or monitor treatment for MRSA infections. Test performance is not FDA approved in patients less than 83 years old. Performed at [facility], [address], [place], [place] [address]   Resp panel by RT-PCR (RSV, Flu A&B, Covid) Nasal Mucosa     Status: None   Collection Time: 04/10/24  9:42 AM   Specimen: Nasal Mucosa; Nasal Swab  Result Value Ref Range Status   SARS Coronavirus 2 by RT PCR NEGATIVE NEGATIVE Final   Influenza A by PCR NEGATIVE NEGATIVE Final   Influenza B by PCR NEGATIVE NEGATIVE Final    Comment: (NOTE) The Xpert Xpress SARS-CoV-2/FLU/RSV plus assay is intended as an aid in the diagnosis of influenza from Nasopharyngeal swab specimens and should not be used as a sole basis for treatment. Nasal washings and aspirates are unacceptable for Xpert Xpress SARS-CoV-2/FLU/RSV testing.  Fact Sheet for Patients: BloggerCourse.com  Fact Sheet for Healthcare Providers: SeriousBroker.it  This test is not yet approved or cleared by the [place]  FDA and has been authorized for detection and/or diagnosis of SARS-CoV-2 by FDA under an Emergency Use Authorization (EUA). This EUA will remain in effect (meaning this test can be used) for the duration of the COVID-19 declaration under Section 564(b)(1) of the Act, 21 U.S.C. section 360bbb-3(b)(1), unless the authorization is terminated or revoked.     Resp Syncytial Virus by PCR NEGATIVE NEGATIVE Final    Comment: (NOTE) Fact Sheet for Patients: BloggerCourse.com  Fact Sheet for Healthcare Providers: SeriousBroker.it  This test is not yet approved or cleared by the [place]  FDA and has been authorized for  detection and/or diagnosis of SARS-CoV-2 by FDA under an Emergency Use Authorization (EUA). This EUA will remain in effect (meaning this test can be used) for the duration of the COVID-19 declaration under Section 564(b)(1) of the Act, 21 U.S.C. section 360bbb-3(b)(1), unless the authorization is terminated or revoked.  Performed at [facility], [address] [place], [place] [address]     Renal Function: Recent Labs    04/10/24 0548 04/10/24 0601 04/11/24 0725 04/12/24 0244  CREATININE 3.12* 3.30* 2.23* 1.54*   Estimated Creatinine Clearance: 50.1 mL/min (A) (by C-G formula based on SCr of 1.54 mg/dL (H)).  Radiologic Imaging: DG Foot 2 Views Left Result Date: 04/12/2024 EXAM: 2 VIEW(S) XRAY OF THE LEFT FOOT 04/12/2024 09:51:00 AM COMPARISON: None available. 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 Midfoot degenerative changes. Calcaneal spur. No joint dislocation. SOFT TISSUES: Soft tissue swelling of the forefoot. Unchanged tiny, linear radiodensity along the plantar aspect of the mid foot measuring 5 mm IMPRESSION: 1. 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 No convincing evidence for acute osteomyelitis. 2. Soft tissue swelling of the forefoot. Electronically signed by: Waddell Calk MD 04/12/2024 01:00 PM EDT RP Workstation: [id number]   ECHOCARDIOGRAM COMPLETE Result Date: 04/10/2024    ECHOCARDIOGRAM REPORT   Patient Name:   Tim [name] Date of Exam: 04/10/2024 Medical Rec #:  [id number]       Height:       76.0 in Accession #:    [id number]      Weight:       262.1 lb Date of Birth:  08-Aug-1940       BSA:          2.485 m Patient Age:    83 years        BP:           94/63 mmHg Patient Gender: M               HR:           89 bpm. Exam Location:  Inpatient Procedure: 2D Echo, Cardiac Doppler, Color Doppler and  Intracardiac            Opacification Agent (Both Spectral and Color Flow Doppler were            utilized during procedure). Indications:    Shock R57.9  History:        Patient has prior history of Echocardiogram examinations, most                 recent 04/29/2020. CHF, CAD and Previous Myocardial Infarction,  Defibrillator, COPD and Pulmonary HTN, Arrythmias:Atrial                 Fibrillation, Signs/Symptoms:Edema, Dyspnea and Murmur; Risk                 Factors:Sleep Apnea, Hypertension and Diabetes. H/O Shock,                 Aortic stenosis S/P TAVR, Hypotension, Mitral valve vegitation,                 Prosthetic valve endocarditis, Small PFO.  Sonographer:    BERNARDA ROCKS Referring Phys: [id number] JOSHUA C SMITH IMPRESSIONS  1. Left ventricular ejection fraction, by estimation, is 65 to 70%. The left ventricle has normal function. The left ventricle has no regional wall motion abnormalities. There is mild concentric left ventricular hypertrophy. Left ventricular diastolic parameters are indeterminate.  2. Right ventricular systolic function is normal. The right ventricular size is normal.  3. Left atrial size was moderately dilated.  4. Possible vegetation versus mobile calcium  on anterior leaflet. The mitral valve is abnormal. No evidence of mitral valve regurgitation. Mild mitral stenosis. Severe mitral annular calcification.  5. The aortic valve is abnormal. There is severe calcifcation of the aortic valve. Aortic valve regurgitation is not visualized. Severe aortic valve stenosis. Aortic valve area, by VTI measures 0.76 cm. Aortic valve mean gradient measures 46.0 mmHg. Aortic valve Vmax measures 4.58 m/s.  6. The inferior vena cava is normal in size with greater than 50% respiratory variability, suggesting right atrial pressure of 3 mmHg. Conclusion(s)/Recommendation(s): Technically very difficult study due to poor sound wave transmission. FINDINGS  Left Ventricle: Left ventricular  ejection fraction, by estimation, is 65 to 70%. The left ventricle has normal function. The left ventricle has no regional wall motion abnormalities. Definity  contrast agent was given IV to delineate the left ventricular  endocardial borders. The left ventricular internal cavity size was normal in size. There is mild concentric left ventricular hypertrophy. Left ventricular diastolic parameters are indeterminate. Right Ventricle: The right ventricular size is normal. No increase in right ventricular wall thickness. Right ventricular systolic function is normal. Left Atrium: Left atrial size was moderately dilated. Right Atrium: Right atrial size was normal in size. Pericardium: There is no evidence of pericardial effusion. Mitral Valve: Possible vegetation versus mobile calcium  on anterior leaflet. The mitral valve is abnormal. There is mild calcification of the mitral valve leaflet(s). Severe mitral annular calcification. No evidence of mitral valve regurgitation. Mild mitral valve stenosis. MV peak gradient, 11.7 mmHg. The mean mitral valve gradient is 5.0 mmHg. Tricuspid Valve: The tricuspid valve is not well visualized. Tricuspid valve regurgitation is not demonstrated. No evidence of tricuspid stenosis. Aortic Valve: The aortic valve is abnormal. There is severe calcifcation of the aortic valve. Aortic valve regurgitation is not visualized. Severe aortic stenosis is present. Aortic valve mean gradient measures 46.0 mmHg. Aortic valve peak gradient measures 83.9 mmHg. Aortic valve area, by VTI measures 0.76 cm. 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 Pulmonic Valve: The pulmonic valve was not well visualized. Pulmonic valve regurgitation is not visualized. No evidence of pulmonic stenosis. Aorta: The aortic root is normal in size and structure. Venous: The inferior vena cava was not well visualized. The inferior vena cava is normal in size with greater  than 50% respiratory variability, suggesting right atrial pressure of 3 mmHg. IAS/Shunts: No atrial level shunt detected by color flow Doppler. Additional  Comments: A device lead is visualized.  LEFT VENTRICLE PLAX 2D LVIDd:         2.80 cm      Diastology LVIDs:         1.70 cm      LV e' medial:    10.30 cm/s LV PW:         1.10 cm      LV E/e' medial:  11.2 LV IVS:        1.10 cm      LV e' lateral:   8.05 cm/s LVOT diam:     1.70 cm      LV E/e' lateral: 14.3 LV SV:         67 LV SV Index:   27 LVOT Area:     2.27 cm  LV Volumes (MOD) LV vol d, MOD A2C: 180.0 ml LV vol d, MOD A4C: 141.0 ml LV vol s, MOD A2C: 55.3 ml LV vol s, MOD A4C: 41.6 ml LV SV MOD A2C:     124.7 ml LV SV MOD A4C:     141.0 ml LV SV MOD BP:      116.7 ml RIGHT VENTRICLE             IVC RV Basal diam:  3.85 cm     IVC diam: 1.30 cm RV S prime:     23.50 cm/s TAPSE (M-mode): 3.3 cm LEFT ATRIUM             Index        RIGHT ATRIUM           Index LA diam:        4.70 cm 1.89 cm/m   RA Area:     17.60 cm LA Vol (A2C):   99.7 ml 40.12 ml/m  RA Volume:   47.40 ml  19.07 ml/m LA Vol (A4C):   60.5 ml 24.34 ml/m LA Biplane Vol: 81.4 ml 32.75 ml/m  AORTIC VALVE                     PULMONIC VALVE AV Area (Vmax):    0.72 cm      PV Vmax:          0.90 m/s AV Area (Vmean):   0.66 cm      PV Peak grad:     3.2 mmHg AV Area (VTI):     0.76 cm      PR End Diast Vel: 1.99 msec AV Vmax:           458.00 cm/s AV Vmean:          319.000 cm/s AV VTI:            0.886 m AV Peak Grad:      83.9 mmHg AV Mean Grad:      46.0 mmHg LVOT Vmax:         145.00 cm/s LVOT Vmean:        93.050 cm/s LVOT VTI:          0.297 m LVOT/AV VTI ratio: 0.34  AORTA Ao Root diam: 2.30 cm MITRAL VALVE MV Area (PHT): 4.46 cm     SHUNTS MV Area VTI:   1.56 cm     Systemic VTI:  0.30 m MV Peak grad:  11.7 mmHg    Systemic Diam: 1.70 cm MV Mean grad:  5.0 mmHg MV Vmax:       1.71 m/s MV Vmean:  98.4 cm/s MV Decel Time: 170 msec MV E velocity: 115.00 cm/s MV A velocity:  167.00 cm/s MV E/A ratio:  0.69 Toribio Fuel MD Electronically signed by Toribio Fuel MD Signature Date/Time: 04/10/2024/3:46:19 PM    Final     I independently reviewed the above imaging studies.  Impression/Recommendation 20M with significant heart history, consulted for gross hematuria on Eliquis .   #gross hematuria, no clot and draining well with condom catheter  - Reviewed various etiologies of hematuria, including infection, kidney stones, trauma, enlarged prostate, and cancer. Discussed typical workup for [facility] involves CT Urogram and cystoscopy to rule out concerning lesions in the upper tract or bladder. Discussed that while he is inpatient, his current cardiac concerns will take precedence and that the majority of this workup will occur outpatient.  - Recommend obtaining UA and urine culture. Treat as indicated.  - If able, please obtain CT Urogram prior to discharge as this will help with expedited workup.  - We will plan for cytoscopic evaluation outpatient. - If medically safe to do so, recommend holding Eliquis  until outpatient follow-up with Urology.   - Start Finasteride  5mg  daily, recommend continuing this at discharge.  - Please re-engage Urology is urine becomes thicker with large clots and/or persistently clotting off catheter.   Adira Limburg N Rilen Shukla 04/12/2024, 2:35 PM

## 2024-04-12 NOTE — Plan of Care (Signed)
  Problem: Education: Goal: Knowledge of General Education information will improve Description: Including pain rating scale, medication(s)/side effects and non-pharmacologic comfort measures Outcome: Progressing   Problem: Health Behavior/Discharge Planning: Goal: Ability to manage health-related needs will improve Outcome: Progressing   Problem: Clinical Measurements: Goal: Ability to maintain clinical measurements within normal limits will improve Outcome: Progressing Goal: Diagnostic test results will improve Outcome: Progressing Goal: Respiratory complications will improve Outcome: Progressing   Problem: Coping: Goal: Level of anxiety will decrease Outcome: Progressing   Problem: Elimination: Goal: Will not experience complications related to bowel motility Outcome: Progressing Goal: Will not experience complications related to urinary retention Outcome: Progressing   Problem: Pain Managment: Goal: General experience of comfort will improve and/or be controlled Outcome: Progressing   Problem: Activity: Goal: Risk for activity intolerance will decrease Outcome: Not Progressing   Problem: Nutrition: Goal: Adequate nutrition will be maintained Outcome: Not Progressing   Problem: Safety: Goal: Ability to remain free from injury will improve Outcome: Not Progressing   Problem: Skin Integrity: Goal: Risk for impaired skin integrity will decrease Outcome: Not Progressing   Problem: Nutritional: Goal: Maintenance of adequate nutrition will improve Outcome: Not Progressing

## 2024-04-12 NOTE — Progress Notes (Signed)
 Progress Note  Patient Name: Tim [name] Date of Encounter: 04/12/2024  Primary Cardiologist:   Redell Shallow, MD   Subjective   He denies chest pain or SOB.   Inpatient Medications    Scheduled Meds:  apixaban   2.5 mg Oral BID   Chlorhexidine  Gluconate Cloth  6 each Topical Daily   gabapentin   100 mg Oral TID   insulin  aspart  0-5 Units Subcutaneous QHS   insulin  aspart  0-9 Units Subcutaneous TID WC   lidocaine   1 patch Transdermal Q24H   nystatin    Topical BID   Continuous Infusions:  cefTRIAXone  (ROCEPHIN )  IV Stopped (04/11/24 1221)   linezolid  (ZYVOX ) IV Stopped (04/11/24 2311)   norepinephrine (LEVOPHED) Adult infusion 2 mcg/min (04/12/24 0700)   PRN Meds: acetaminophen , docusate sodium , fentaNYL  (SUBLIMAZE ) injection, ondansetron  (ZOFRAN ) IV, mouth rinse, polyethylene glycol   Vital Signs    Vitals:   04/12/24 0515 04/12/24 0530 04/12/24 0545 04/12/24 0749  BP: 113/60 (!) 111/59 (!) 100/55   Pulse: 91 94 89   Resp: 15 14 17    Temp:    98.5 F (36.9 C)  TempSrc:    Oral  SpO2: 96% 95% 95%   Weight:      Height:        Intake/Output Summary (Last 24 hours) at 04/12/2024 0904 Last data filed at 04/12/2024 0700 Gross per 24 hour  Intake 1051.78 ml  Output 2350 ml  Net -1298.22 ml   Filed Weights   04/10/24 1040 04/11/24 0500 04/12/24 0456  Weight: 118.9 kg 117.1 kg 117.8 kg    Telemetry    NA - Personally Reviewed  ECG    NA - Personally Reviewed  Physical Exam   GEN: No acute distress.   Neck: No  JVD Cardiac: RRR, 3/6 apical systolic murmur, no diastolic murmurs, rubs, or gallops.  Respiratory:     Decreased breath sounds at the bases.  GI: Soft, nontender, non-distended  MS:    Diffuse upper extremity edema, mild lower edema; No deformity. Neuro:  Nonfocal  Psych: Normal affect   Labs    Chemistry Recent Labs  Lab 04/10/24 0548 04/10/24 0601 04/11/24 0725 04/12/24 0244  NA 130* 130* 133* 134*  K 4.9 5.5* 3.8 4.0   CL 91* 95* 95* 95*  CO2 25  --  23 29  GLUCOSE 144* 140* 173* 143*  BUN 83* 97* 63* 45*  CREATININE 3.12* 3.30* 2.23* 1.54*  CALCIUM  9.0  --  8.7* 8.7*  PROT 6.5  --   --   --   ALBUMIN 3.4*  --   --   --   AST 75*  --   --   --   ALT 36  --   --   --   ALKPHOS 90  --   --   --   BILITOT 0.9  --   --   --   GFRNONAA 19*  --  28* 44*  ANIONGAP 14  --  15 10     Hematology Recent Labs  Lab 04/10/24 0548 04/10/24 0601 04/11/24 0725 04/12/24 0244  WBC 9.0  --  10.2 9.8  RBC 3.63*  --  3.56* 3.15*  HGB 11.9* 11.9* 11.7* 10.4*  HCT 35.0* 35.0* 33.3* 29.9*  MCV 96.4  --  93.5 94.9  MCH 32.8  --  32.9 33.0  MCHC 34.0  --  35.1 34.8  RDW 14.4  --  14.2 14.2  PLT 153  --  148* 134*    Cardiac EnzymesNo results for input(s): TROPONINI in the last 168 hours. No results for input(s): TROPIPOC in the last 168 hours.   BNP Recent Labs  Lab 04/10/24 0548  BNP 60.6     DDimer No results for input(s): DDIMER in the last 168 hours.   Radiology    ECHOCARDIOGRAM COMPLETE Result Date: 04/10/2024    ECHOCARDIOGRAM REPORT   Patient Name:   Tim [name] Date of Exam: 04/10/2024 Medical Rec #:  [id number]       Height:       76.0 in Accession #:    [id number]      Weight:       262.1 lb Date of Birth:  [birth year]       BSA:          2.485 m Patient Age:    83 years        BP:           94/63 mmHg Patient Gender: M               HR:           89 bpm. Exam Location:  Inpatient Procedure: 2D Echo, Cardiac Doppler, Color Doppler and Intracardiac            Opacification Agent (Both Spectral and Color Flow Doppler were            utilized during procedure). Indications:    Shock R57.9  History:        Patient has prior history of Echocardiogram examinations, most                 recent 04/29/2020. CHF, CAD and Previous Myocardial Infarction,                 Defibrillator, COPD and Pulmonary HTN, Arrythmias:Atrial                 Fibrillation, Signs/Symptoms:Edema, Dyspnea and Murmur; Risk                  Factors:Sleep Apnea, Hypertension and Diabetes. H/O Shock,                 Aortic stenosis S/P TAVR, Hypotension, Mitral valve vegitation,                 Prosthetic valve endocarditis, Small PFO.  Sonographer:    BERNARDA ROCKS Referring Phys: [id number] JOSHUA C SMITH IMPRESSIONS  1. Left ventricular ejection fraction, by estimation, is 65 to 70%. The left ventricle has normal function. The left ventricle has no regional wall motion abnormalities. There is mild concentric left ventricular hypertrophy. Left ventricular diastolic parameters are indeterminate.  2. Right ventricular systolic function is normal. The right ventricular size is normal.  3. Left atrial size was moderately dilated.  4. Possible vegetation versus mobile calcium  on anterior leaflet. The mitral valve is abnormal. No evidence of mitral valve regurgitation. Mild mitral stenosis. Severe mitral annular calcification.  5. The aortic valve is abnormal. There is severe calcifcation of the aortic valve. Aortic valve regurgitation is not visualized. Severe aortic valve stenosis. Aortic valve area, by VTI measures 0.76 cm. Aortic valve mean gradient measures 46.0 mmHg. Aortic valve Vmax measures 4.58 m/s.  6. The inferior vena cava is normal in size with greater than 50% respiratory variability, suggesting right atrial pressure of 3 mmHg. Conclusion(s)/Recommendation(s): Technically very difficult study due to poor sound wave transmission. FINDINGS  Left Ventricle: Left ventricular ejection  fraction, by estimation, is 65 to 70%. The left ventricle has normal function. The left ventricle has no regional wall motion abnormalities. Definity  contrast agent was given IV to delineate the left ventricular  endocardial borders. The left ventricular internal cavity size was normal in size. There is mild concentric left ventricular hypertrophy. Left ventricular diastolic parameters are indeterminate. Right Ventricle: The right ventricular size is  normal. No increase in right ventricular wall thickness. Right ventricular systolic function is normal. Left Atrium: Left atrial size was moderately dilated. Right Atrium: Right atrial size was normal in size. Pericardium: There is no evidence of pericardial effusion. Mitral Valve: Possible vegetation versus mobile calcium  on anterior leaflet. The mitral valve is abnormal. There is mild calcification of the mitral valve leaflet(s). Severe mitral annular calcification. No evidence of mitral valve regurgitation. Mild mitral valve stenosis. MV peak gradient, 11.7 mmHg. The mean mitral valve gradient is 5.0 mmHg. Tricuspid Valve: The tricuspid valve is not well visualized. Tricuspid valve regurgitation is not demonstrated. No evidence of tricuspid stenosis. Aortic Valve: The aortic valve is abnormal. There is severe calcifcation of the aortic valve. Aortic valve regurgitation is not visualized. Severe aortic stenosis is present. Aortic valve mean gradient measures 46.0 mmHg. Aortic valve peak gradient measures 83.9 mmHg. Aortic valve area, by VTI measures 0.76 cm. 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 Pulmonic Valve: The pulmonic valve was not well visualized. Pulmonic valve regurgitation is not visualized. No evidence of pulmonic stenosis. Aorta: The aortic root is normal in size and structure. Venous: The inferior vena cava was not well visualized. The inferior vena cava is normal in size with greater than 50% respiratory variability, suggesting right atrial pressure of 3 mmHg. IAS/Shunts: No atrial level shunt detected by color flow Doppler. Additional Comments: A device lead is visualized.  LEFT VENTRICLE PLAX 2D LVIDd:         2.80 cm      Diastology LVIDs:         1.70 cm      LV e' medial:    10.30 cm/s LV PW:         1.10 cm      LV E/e' medial:  11.2 LV IVS:        1.10 cm      LV e' lateral:   8.05 cm/s LVOT diam:     1.70 cm      LV E/e' lateral:  14.3 LV SV:         67 LV SV Index:   27 LVOT Area:     2.27 cm  LV Volumes (MOD) LV vol d, MOD A2C: 180.0 ml LV vol d, MOD A4C: 141.0 ml LV vol s, MOD A2C: 55.3 ml LV vol s, MOD A4C: 41.6 ml LV SV MOD A2C:     124.7 ml LV SV MOD A4C:     141.0 ml LV SV MOD BP:      116.7 ml RIGHT VENTRICLE             IVC RV Basal diam:  3.85 cm     IVC diam: 1.30 cm RV S prime:     23.50 cm/s TAPSE (M-mode): 3.3 cm LEFT ATRIUM             Index        RIGHT ATRIUM           Index LA diam:  4.70 cm 1.89 cm/m   RA Area:     17.60 cm LA Vol (A2C):   99.7 ml 40.12 ml/m  RA Volume:   47.40 ml  19.07 ml/m LA Vol (A4C):   60.5 ml 24.34 ml/m LA Biplane Vol: 81.4 ml 32.75 ml/m  AORTIC VALVE                     PULMONIC VALVE AV Area (Vmax):    0.72 cm      PV Vmax:          0.90 m/s AV Area (Vmean):   0.66 cm      PV Peak grad:     3.2 mmHg AV Area (VTI):     0.76 cm      PR End Diast Vel: 1.99 msec AV Vmax:           458.00 cm/s AV Vmean:          319.000 cm/s AV VTI:            0.886 m AV Peak Grad:      83.9 mmHg AV Mean Grad:      46.0 mmHg LVOT Vmax:         145.00 cm/s LVOT Vmean:        93.050 cm/s LVOT VTI:          0.297 m LVOT/AV VTI ratio: 0.34  AORTA Ao Root diam: 2.30 cm MITRAL VALVE MV Area (PHT): 4.46 cm     SHUNTS MV Area VTI:   1.56 cm     Systemic VTI:  0.30 m MV Peak grad:  11.7 mmHg    Systemic Diam: 1.70 cm MV Mean grad:  5.0 mmHg MV Vmax:       1.71 m/s MV Vmean:      98.4 cm/s MV Decel Time: 170 msec MV E velocity: 115.00 cm/s MV A velocity: 167.00 cm/s MV E/A ratio:  0.69 Toribio Fuel MD Electronically signed by Toribio Fuel MD Signature Date/Time: 04/10/2024/3:46:19 PM    Final     Cardiac Studies   See above  Patient Profile     83 y.o. 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  Cardiology consulted for severe prosthetic valve stenosis and possible mitral valve vegetation.    Assessment & Plan    Shock:   Thought to be related to hypovolemia and responsive to volume.  Blood cultures negative to date.  Holding home blood pressure medications.  BP still remains labile.  TAVR with a history of endocarditis: Will be assessed as below.  Mitral valve lesion: This is thought to be calcification rather than active vegetation.  To further clarify once he is stable he is planned to have TEE.  AKI: Creatinine continues to improve with hydration.  CAD: No current acute coronary symptoms.  Plan continued medical management.  Discontinue aspirin  as he is on Eliquis  chronically.  PAF: No recurrent paroxysms.  On Eliquis   Complete heart block: Status post permanent pacemaker with normal function.  For questions or updates, please contact [facility] Please consult [email or web address] for contact info under Cardiology/STEMI.   Signed, Lynwood Schilling, MD  04/12/2024, 9:04 AM

## 2024-04-12 NOTE — Progress Notes (Signed)
 [facility]    Date of Admission:  04/10/2024   Total days of antibiotics 3/ceftriaxone  plus linezolid    ID: Tim [name] is a 83 y.o. 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 He is admitted for encephalopathy/sepsis.   Principal Problem:   Shock (HCC)    Subjective: Afebrile. Blood cx remain NGTD as of 10/9 Being evaluated by cardiology for TEE  Medications:   apixaban   2.5 mg Oral BID   Chlorhexidine  Gluconate Cloth  6 each Topical Daily   feeding supplement  1 Container Oral TID BM   gabapentin   100 mg Oral TID   insulin  aspart  0-5 Units Subcutaneous QHS   insulin  aspart  0-9 Units Subcutaneous TID WC   lidocaine   1 patch Transdermal Q24H   nystatin    Topical BID    Objective: Vital signs in last 24 hours: Temp:  [98.5 F (36.9 C)-99.4 F (37.4 C)] 98.5 F (36.9 C) (10/11 0749) Pulse Rate:  [78-100] 84 (10/11 0915) Resp:  [11-29] 13 (10/11 0915) BP: (67-132)/(27-101) 83/63 (10/11 0915) SpO2:  [90 %-100 %] 93 % (10/11 0915) FiO2 (%):  [21 %] 21 % (10/10 2218) Weight:  [117.8 kg] 117.8 kg (10/11 0456) Physical Exam  Constitutional: He is oriented to person, place, and time. He appears well-developed and well-nourished. No distress.  HENT:  Mouth/Throat: Oropharynx is clear and moist. No oropharyngeal exudate.  Cardiovascular: Normal rate, regular rhythm and normal heart sounds. Exam reveals no gallop and no friction rub.  No murmur heard.  Pulmonary/Chest: Effort normal and breath sounds normal. No respiratory distress. He has no wheezes.  Abdominal: Soft. Bowel sounds are normal. He exhibits no distension. There is no tenderness.  Lymphadenopathy:  He has no cervical adenopathy.  Neurological: He is alert and oriented to person, place, and time.  Skin: Skin is warm and dry. No rash noted. Tissue injury to left lower  elg Psychiatric: He has a normal mood and affect. His behavior is normal.    Lab Results Recent Labs    04/11/24 0725 04/12/24 0244  WBC 10.2 9.8  HGB 11.7* 10.4*  HCT 33.3* 29.9*  NA 133* 134*  K 3.8 4.0  CL 95* 95*  CO2 23 29  BUN 63* 45*  CREATININE 2.23* 1.54*   Liver Panel Recent Labs    04/10/24 0548  PROT 6.5  ALBUMIN 3.4*  AST 75*  ALT 36  ALKPHOS 90  BILITOT 0.9   Sedimentation Rate No results for input(s): ESRSEDRATE in the last 72 hours. C-Reactive Protein No results for input(s): CRP in the last 72 hours.  Microbiology: reviewed Studies/Results: ECHOCARDIOGRAM COMPLETE Result Date: 04/10/2024    ECHOCARDIOGRAM REPORT   Patient Name:   ZAKI GERTSCH Date of Exam: 04/10/2024 Medical Rec #:  [id number]       Height:       76.0 in Accession #:    [id number]      Weight:       262.1 lb Date of Birth:  [birth year]       BSA:          2.485 m Patient Age:    83 years        BP:           94/63 mmHg Patient Gender: M               HR:  89 bpm. Exam Location:  Inpatient Procedure: 2D Echo, Cardiac Doppler, Color Doppler and Intracardiac            Opacification Agent (Both Spectral and Color Flow Doppler were            utilized during procedure). Indications:    Shock R57.9  History:        Patient has prior history of Echocardiogram examinations, most                 recent 04/29/2020. CHF, CAD and Previous Myocardial Infarction,                 Defibrillator, COPD and Pulmonary HTN, Arrythmias:Atrial                 Fibrillation, Signs/Symptoms:Edema, Dyspnea and Murmur; Risk                 Factors:Sleep Apnea, Hypertension and Diabetes. H/O Shock,                 Aortic stenosis S/P TAVR, Hypotension, Mitral valve vegitation,                 Prosthetic valve endocarditis, Small PFO.  Sonographer:    BERNARDA ROCKS Referring Phys: [id number] JOSHUA C SMITH IMPRESSIONS  1. Left ventricular ejection fraction, by estimation, is 65 to 70%. The left ventricle has  normal function. The left ventricle has no regional wall motion abnormalities. There is mild concentric left ventricular hypertrophy. Left ventricular diastolic parameters are indeterminate.  2. Right ventricular systolic function is normal. The right ventricular size is normal.  3. Left atrial size was moderately dilated.  4. Possible vegetation versus mobile calcium  on anterior leaflet. The mitral valve is abnormal. No evidence of mitral valve regurgitation. Mild mitral stenosis. Severe mitral annular calcification.  5. The aortic valve is abnormal. There is severe calcifcation of the aortic valve. Aortic valve regurgitation is not visualized. Severe aortic valve stenosis. Aortic valve area, by VTI measures 0.76 cm. Aortic valve mean gradient measures 46.0 mmHg. Aortic valve Vmax measures 4.58 m/s.  6. The inferior vena cava is normal in size with greater than 50% respiratory variability, suggesting right atrial pressure of 3 mmHg. Conclusion(s)/Recommendation(s): Technically very difficult study due to poor sound wave transmission. FINDINGS  Left Ventricle: Left ventricular ejection fraction, by estimation, is 65 to 70%. The left ventricle has normal function. The left ventricle has no regional wall motion abnormalities. Definity  contrast agent was given IV to delineate the left ventricular  endocardial borders. The left ventricular internal cavity size was normal in size. There is mild concentric left ventricular hypertrophy. Left ventricular diastolic parameters are indeterminate. Right Ventricle: The right ventricular size is normal. No increase in right ventricular wall thickness. Right ventricular systolic function is normal. Left Atrium: Left atrial size was moderately dilated. Right Atrium: Right atrial size was normal in size. Pericardium: There is no evidence of pericardial effusion. Mitral Valve: Possible vegetation versus mobile calcium  on anterior leaflet. The mitral valve is abnormal. There is mild  calcification of the mitral valve leaflet(s). Severe mitral annular calcification. No evidence of mitral valve regurgitation. Mild mitral valve stenosis. MV peak gradient, 11.7 mmHg. The mean mitral valve gradient is 5.0 mmHg. Tricuspid Valve: The tricuspid valve is not well visualized. Tricuspid valve regurgitation is not demonstrated. No evidence of tricuspid stenosis. Aortic Valve: The aortic valve is abnormal. There is severe calcifcation of the aortic valve. Aortic valve regurgitation is not visualized. Severe aortic  stenosis is present. Aortic valve mean gradient measures 46.0 mmHg. Aortic valve peak gradient measures 83.9 mmHg. Aortic valve area, by VTI measures 0.76 cm. 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 Pulmonic Valve: The pulmonic valve was not well visualized. Pulmonic valve regurgitation is not visualized. No evidence of pulmonic stenosis. Aorta: The aortic root is normal in size and structure. Venous: The inferior vena cava was not well visualized. The inferior vena cava is normal in size with greater than 50% respiratory variability, suggesting right atrial pressure of 3 mmHg. IAS/Shunts: No atrial level shunt detected by color flow Doppler. Additional Comments: A device lead is visualized.  LEFT VENTRICLE PLAX 2D LVIDd:         2.80 cm      Diastology LVIDs:         1.70 cm      LV e' medial:    10.30 cm/s LV PW:         1.10 cm      LV E/e' medial:  11.2 LV IVS:        1.10 cm      LV e' lateral:   8.05 cm/s LVOT diam:     1.70 cm      LV E/e' lateral: 14.3 LV SV:         67 LV SV Index:   27 LVOT Area:     2.27 cm  LV Volumes (MOD) LV vol d, MOD A2C: 180.0 ml LV vol d, MOD A4C: 141.0 ml LV vol s, MOD A2C: 55.3 ml LV vol s, MOD A4C: 41.6 ml LV SV MOD A2C:     124.7 ml LV SV MOD A4C:     141.0 ml LV SV MOD BP:      116.7 ml RIGHT VENTRICLE             IVC RV Basal diam:  3.85 cm     IVC diam: 1.30 cm RV S prime:     23.50 cm/s TAPSE  (M-mode): 3.3 cm LEFT ATRIUM             Index        RIGHT ATRIUM           Index LA diam:        4.70 cm 1.89 cm/m   RA Area:     17.60 cm LA Vol (A2C):   99.7 ml 40.12 ml/m  RA Volume:   47.40 ml  19.07 ml/m LA Vol (A4C):   60.5 ml 24.34 ml/m LA Biplane Vol: 81.4 ml 32.75 ml/m  AORTIC VALVE                     PULMONIC VALVE AV Area (Vmax):    0.72 cm      PV Vmax:          0.90 m/s AV Area (Vmean):   0.66 cm      PV Peak grad:     3.2 mmHg AV Area (VTI):     0.76 cm      PR End Diast Vel: 1.99 msec AV Vmax:           458.00 cm/s AV Vmean:          319.000 cm/s AV VTI:            0.886 m AV Peak Grad:      83.9 mmHg AV Mean Grad:  46.0 mmHg LVOT Vmax:         145.00 cm/s LVOT Vmean:        93.050 cm/s LVOT VTI:          0.297 m LVOT/AV VTI ratio: 0.34  AORTA Ao Root diam: 2.30 cm MITRAL VALVE MV Area (PHT): 4.46 cm     SHUNTS MV Area VTI:   1.56 cm     Systemic VTI:  0.30 m MV Peak grad:  11.7 mmHg    Systemic Diam: 1.70 cm MV Mean grad:  5.0 mmHg MV Vmax:       1.71 m/s MV Vmean:      98.4 cm/s MV Decel Time: 170 msec MV E velocity: 115.00 cm/s MV A velocity: 167.00 cm/s MV E/A ratio:  0.69 Toribio Fuel MD Electronically signed by Toribio Fuel MD Signature Date/Time: 04/10/2024/3:46:19 PM    Final      Assessment/Plan: Sepsis of unclear origin = appears much improved, mentation back to baseline. No new bacteremia - no growth at 48hrs. Await another 24hrs before consideration to de-escalate abtx  Abn TTE, possible veg vs calcified lesion of mv = continue on linezolid  and ceftriaxone  for the timebeing. Agree with TEE (he reports it was difficult in the past?)  Hx of IACD, end of life of battery = patient reports that he is concerned that his battery of device is end of life, and unclear what are the next steps  Medication toxicity/long term medication = platelets stable while on linezolid   evaluation of this patient requires complex antimicrobial therapy evaluation and counseling  and isolation needs for disease transmission risk assessment and mitigation.   [facility] Pager: [phone]  04/12/2024, 12:04 PM

## 2024-04-12 NOTE — Progress Notes (Signed)
 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  His wife notes over the weekend he was a lot little more confused.  He had worsening neuropathy symptoms and was taking extra gabapentin  and extra oxycodone .  She then notes over the next couple of days of the decreased urine output, decreased oral intake.  She checked his blood pressure at home and it was reading low.  At 1 point he was having such severe neuropathy that he fell and developed a left leg laceration.  The bleeding on this has since stopped.  His wife called EMS overnight due to the confusion and ended up bringing him in.  The patient was confused and lethargic when he came in but since receiving some IV fluids and vasopressors he seems more alert.  He is able to deny any fevers chills night sweats or weight loss.  He denies any cough or shortness of breath or chest pain.  He has some mild dysuria.  No abdominal pain or nausea.  No new skin rashes other than the laceration.  PCCM is consulted on this patient with hypotension requiring vasopressors and confusion.   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  Confusion is improved.  He still has hematuria.  Hemoglobin slightly dropped to 10.4.  Levophed is weaned down to stop..  Objective    Blood pressure (!) 119/56, pulse 86, temperature 97.8 F (36.6 C), temperature source Oral, resp. rate (!) 23, height 6' 4 (1.93 m), weight 117.8 kg, SpO2 100%.    FiO2 (%):  [21 %] 21 %   Intake/Output Summary (Last 24 hours) at 04/12/2024 1813 Last data filed at 04/12/2024 1700 Gross per 24 hour  Intake 854.82 ml  Output 2150 ml  Net -1295.18 ml   Filed Weights   04/10/24 1040 04/11/24 0500 04/12/24 0456  Weight: 118.9 kg 117.1 kg 117.8 kg    Examination: General: Alert and oriented confused at times no distress HENT: Moist mucous membranes Lungs: Clear lungs bilaterally Cardiovascular: RRR, systolic murmur S1-S2 heard Abdomen: obese, soft, nontender Extremities: Venous eczema noted both legs.  There is pitting edema.  Left heel skin damages bandaged.  Left toe amputations noted. Neuro: awake, alert, intermittently follows commands.  He is not oriented.   GU: no scrotal edema.  Foley shows bloody urine.   Resolved problem list   Assessment and Plan   Septic vs Hypovolemic Shock H/o Endocarditis H/o Atrial Fibrillation - Status post IVF resuscitation - vasopressors peripherall with norepinephrine goal MAP>65  - Off Levophed today - blood cultures growth to date - UA negative -Chest x-ray shows no pneumonia. - Appreciate ID input on empiric pip-tazo and Zyvox  -Appreciate cardiology input -  Patient currently is on Eliquis  which will be held given the hematuria.  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 - Repeat  echocardiogram on 10/9 showed possible vegetation versus calcification on the anterior leaflet. -Consulted cardiology - hold diuretics, anti-hypertensive medications. - Eliquis  was restarted therefore we will stop Eliquis  today given the hematuria - Appreciate cardiology input plans for TEE noted  Oliguric AKI -improving Hematuria - likely pre-renal vs intrinsic -Urine output is improving -Creatinine is improving  - Urinalysis is negative - Monitor renal function, electrolytes and urine output. - Consult urology  OSA on CPAP - nocturnal cpap, per home settings  Code status - verified DNR/DNI with family at bedside  Chronic pain on oxycodone  and gabapentin  at home Myoclonus/Jerking -resolved Suspect gabapentin  accumulation due to renal failure Low-dose Neurontin  is restarted. Foot x-ray shows no osteomyelitis  BPH - proscar  at home -  may need foley  Gout Hold allopurinol  given AKI  Prophylaxis already on Eliquis  this will be held given hematuria. CODE STATUS DNR/DNI Wife is in the room spoke with her and updated. Monitor nutritional status.  If blood pressure remains stable we can aim transferring out tomorrow.   Labs   CBC: Recent Labs  Lab 04/10/24 0548 04/10/24 0601 04/11/24 0725 04/12/24 0244  WBC 9.0  --  10.2 9.8  NEUTROABS 6.2  --   --  7.1  HGB 11.9* 11.9* 11.7* 10.4*  HCT 35.0* 35.0* 33.3* 29.9*  MCV 96.4  --  93.5 94.9  PLT 153  --  148* 134*    Basic Metabolic Panel: Recent Labs  Lab 04/10/24 0548 04/10/24 0601 04/11/24 0725 04/12/24 0244  NA 130* 130* 133* 134*  K 4.9 5.5* 3.8 4.0  CL 91* 95* 95* 95*  CO2 25  --  23 29  GLUCOSE 144* 140* 173* 143*  BUN 83* 97* 63* 45*  CREATININE 3.12* 3.30* 2.23* 1.54*  CALCIUM  9.0  --  8.7* 8.7*  MG  --   --  1.6* 2.0  PHOS  --   --  3.8  --    GFR: Estimated Creatinine Clearance: 50.1 mL/min (A) (by C-G formula based on SCr of 1.54 mg/dL (H)). Recent Labs  Lab 04/10/24 0548 04/10/24 0558  04/10/24 0841 04/10/24 0844 04/10/24 0942 04/10/24 1306 04/11/24 0725 04/12/24 0244  PROCALCITON  --   --   --   --  0.13  --   --   --   WBC 9.0  --   --   --   --   --  10.2 9.8  LATICACIDVEN  --    < > 2.0* 0.8 0.7 0.9  --   --    < > = values in this interval not displayed.    Liver Function Tests: Recent Labs  Lab 04/10/24 0548  AST 75*  ALT 36  ALKPHOS 90  BILITOT 0.9  PROT 6.5  ALBUMIN 3.4*   No results for input(s): LIPASE, AMYLASE in the last 168 hours. No results for input(s): AMMONIA in the last 168 hours.  ABG    Component Value Date/Time   PHART 7.397 12/24/2012 1719   PCO2ART 40.9 12/24/2012 1719   PO2ART 87.0 12/24/2012 1719   HCO3 26.7 (H) 12/24/2012 1726   TCO2 29 04/10/2024 0601  O2SAT 69.0 12/24/2012 1726     Coagulation Profile: Recent Labs  Lab 04/10/24 0618  INR 1.9*    Cardiac Enzymes: Recent Labs  Lab 04/10/24 0942  CKTOTAL 53    HbA1C: Hemoglobin A1C  Date/Time Value Ref Range Status  03/06/2024 02:50 PM 5.8 (A) 4.0 - 5.6 % Final  06/08/2023 03:20 PM 5.5 4.0 - 5.6 % Final   HbA1c, POC (prediabetic range)  Date/Time Value Ref Range Status  03/06/2024 02:50 PM 5.8 5.7 - 6.4 % Final  06/08/2023 03:20 PM 5.5 (A) 5.7 - 6.4 % Final   HbA1c, POC (controlled diabetic range)  Date/Time Value Ref Range Status  03/06/2024 02:50 PM 5.8 0.0 - 7.0 % Final  06/08/2023 03:20 PM 5.5 0.0 - 7.0 % Final   HbA1c POC (<> result, manual entry)  Date/Time Value Ref Range Status  03/06/2024 02:50 PM 5.8 4.0 - 5.6 % Final  06/08/2023 03:20 PM 5.5 4.0 - 5.6 % Final    CBG: Recent Labs  Lab 04/11/24 1951 04/12/24 0746 04/12/24 1130 04/12/24 1618 04/12/24 1735  GLUCAP 183* 147* 147* 116* 136*    Review of Systems:   + confusion, dizziness, neuropathy, dysuria - fevers chills night sweats hematuria Rest per HPI above Past Medical History:  He,  has a past medical history of AICD (automatic cardioverter/defibrillator) present,  Balanitis, BPH (benign prostatic hypertrophy), CAD (coronary artery disease), CATARACT, HX OF, Chronic combined systolic and diastolic CHF (congestive heart failure) (HCC) (05/2016), Chronic pain syndrome, Complicated UTI (urinary tract infection) (09/2019), COPD, Debilitated patient, Diabetes mellitus type 2 with complications (HCC), Enterococcal bacteremia (09/2019), Essential hypertension, benign, Fatty liver (2007), Generalized weakness, GERD (gastroesophageal reflux disease), Glaucoma, GOUT, HH (hiatus hernia), HYPERCHOLESTEROLEMIA-PURE, Hypogonadism male, ICD (implantable cardioverter-defibrillator) infection (12/22/2019), 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), Iron deficiency anemia, Lumbosacral neuritis, Lumbosacral spondylosis, Morbid obesity (HCC), Normal memory function (08/2014), Normocytic anemia (03/2016), NSTEMI (non-ST elevated myocardial infarction) (HCC) (03/20/2016), Obesity hypoventilation syndrome (HCC), Orthostatic hypotension, OSA on CPAP, OSTEOARTHRITIS, 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), Paroxysmal atrial fibrillation (HCC) (2003), Peripheral neuropathy, Personal history of colonic adenoma (10/30/2012), PFO (patent foramen ovale) (09/2019), Physical deconditioning (02/23/2021), Presence of cardiac defibrillator (11/07/2017), Primary osteoarthritis of both knees, 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   Surgical History:   Past Surgical History:  Procedure Laterality Date   AMPUTATION Left 04/11/2013   Procedure: AMPUTATION  DIGIT Left 3rd toe;  Surgeon: Jerona LULLA Sage, MD;  Location: [facility] OR;  Service: Orthopedics;  Laterality: Left;  Left 3rd toe amputation at [facility]   AMPUTATION Left 08/29/2019   Procedure: LEFT TRANSMETATARSAL AMPUTATION;  Surgeon: Sage Jerona LULLA, MD;  Location: [facility] OR;  Service: Orthopedics;  Laterality: Left;   BIOPSY  05/04/2020   Procedure: BIOPSY;  Surgeon: Charlanne Groom, MD;  Location: [name] ENDOSCOPY;  Service: Endoscopy;;   CARDIAC CATHETERIZATION  1997; 03/10/16   1997 Non-obstructive disease.  03/2016 BMS to RCA, with 25% pDiag dz, o/w normal cors per cath 03/07/16.  Cath 11/01/16: in stent restenosis, successful baloon angioplasty. 08/2020 branch vessel dz, cont medical therapy rec'd.   CARDIAC CATHETERIZATION  12/24/2012   mild < 20% LCx, prox 30% RCA; LVEF 55-65% , moderate pulmonary HTN, moderate AS   CARDIAC  DEFIBRILLATOR PLACEMENT  11/07/2017   Claria MRI Quad CRT defibrillator   CARDIOVASCULAR STRESS TEST  05/11/16 ([facility])   2017 Myocardial perfusion imaging:  No ischemia; scar in apex, global hypokinesis, EF 36%.  06/15/20->mod primarily fixed inferolat wall defect mildly worse with stress c/w infarct/scar with mild peri-infarct ischemia, normal EF-->for cath per cards.   Carotid dopplers  03/09/2016   [facility]: no hemodynamically significant stenosis on either side.   CHOLECYSTECTOMY     COLONOSCOPY [name] 10/30/2012   Procedure: COLONOSCOPY;  Surgeon: Lupita FORBES Commander, MD;  Location: [facility] ENDOSCOPY;  Service: Endoscopy;  Laterality: [name];   CORONARY ANGIOPLASTY WITH STENT PLACEMENT  03/2016; 04/2017   2017-[facility]: BMS to RCA-pt was placed on Brilinta.  04/2017: DES to RCA.   DORSAL SLIT [name] 10/29/2019   for severe phimosis. Procedure: DORSAL SLIT;  Surgeon: Alvaro Hummer, MD;  Location: [facility] ORS;  Service: Urology;  Laterality: [name];  45 MINS   ESOPHAGEAL DILATION  05/04/2020   Procedure: ESOPHAGEAL DILATION;  Surgeon: Charlanne Groom, MD;  Location[name] END[facility]COPY;  Service: Endoscopy;;    ESOPHAGOGASTRODUODENOSCOPY (EGD) WITH PROPOFOL  [name] 05/04/2020   Procedure: ESOPHAGOGASTRODUODENOSCOPY (EGD) WITH PROPOFOL ;  Surgeon: Charlanne Groom, MD;  Location: [facility] ENDOSCOPY;  Service: Endoscopy;  Laterality: [name];   EYE SURGERY Bilateral cataract   HEMORRHOID SURGERY     INTRAOCULAR LENS INSERTION Bilateral    KNEE SURGERY Right    LEFT AND RIGHT HEART CATHETERIZATION WITH CORONARY ANGIOGRAM [name] 12/24/2012   Procedure: LEFT AND RIGHT HEART CATHETERIZATION WITH CORONARY ANGIOGRAM;  Surgeon: Peter M Swaziland, MD;  Location: [facility] CATH LAB;  Service: Cardiovascular;  Laterality: [name];   LEG SURGERY Bilateral    lenghtening    PACEMAKER PLACEMENT  04/13/2016   2nd deg HB after TAVR, pt had DC MDT PPM placed.   SHOULDER ARTHROSCOPY  08/30/2011   Procedure: ARTHROSCOPY SHOULDER;  Surgeon: Jerona LULLA Sage, MD;  Location: [facility] OR;  Service: Orthopedics;  Laterality: Right;  Right Shoulder Arthroscopy, Debridement, and Decompression   SPINAL CORD STIMULATOR INSERTION [name] 09/10/2015   Procedure: LUMBAR SPINAL CORD STIMULATOR INSERTION;  Surgeon: Deward Fabian, MD;  Location: [facility] NEURO ORS;  Service: Neurosurgery;  Laterality: [name];   TEE WITHOUT CARDIOVERSION [name] 09/16/2019   Procedure: TRANSESOPHAGEAL ECHOCARDIOGRAM (TEE);  Surgeon: Raford Riggs, MD;  Location: [facility] ENDOSCOPY;  Service: Cardiovascular;  Laterality: [name];   TEE WITHOUT CARDIOVERSION [name] 12/02/2019   +vegetation on AVR and pacer lead in RV.  EF 55-60%, normal wall motion.  Valves function normal.  Procedure: TRANSESOPHAGEAL ECHOCARDIOGRAM (TEE);  Surgeon: Barbaraann Darryle Ned, MD;  Location: [name] ENDOSCOPY;  Service: Cardiovascular;  Laterality: [name];   TOE AMPUTATION Left    due to osteomyelitis.  R big toe surg due to osteoarth   TONSILLECTOMY     traeculectomy Left    eye   TRANSCATHETER AORTIC VALVE REPLACEMENT, TRANSFEMORAL  04/11/2016   TRANSESOPHAGEAL ECHOCARDIOGRAM  03/09/2016; 09/2019   [facility]: EF 55-60%, PFO seen with bi-directional  shunting, no thrombus in appendage.  09/2019 ->no valvular vegetations. Small patent foramen ovale with predominantly left to right shunting across the interatrial septum.   TRANSTHORACIC ECHOCARDIOGRAM  01/2015; 01/2016; 05/18/16; 09/18/16, 05/2017, 08/2017   01/2015 No signif change in aortic stenosis (moderate).  01/2016 Severe LVH w/small LV cavity, EF 60-65%, grade I diast dysfxn.  05/2016 (s/p TAVR): EF 50-55%, grd I DD, biopros AV good.  08/2016--EF 50-55%, LV septal motion c/w conduction abnl, grd I DD,mild MS,bioprosth aortic valve well seated, w/trace AR. 05/2017 TTE EF  35%. 08/2017-EF 35%, mod diff hypokin LV, grd I DD, biopros AV good.    TRANSTHORACIC ECHOCARDIOGRAM  04/2018; 09/2019   04/2018: EF 40-45%, mod diffuse LV hypokin, grd I DD, bioprosth AV well seated, no AS or AR. 09/2019 EF 60-65%, grd I DD, valves fine, including bioprosth AV. 04/29/20 (tech diff) EF 55-60%, grd I DD, vegetation on MV.  07/2021 EF 55-60% (s/p upgrade to BiV PPM), AV well seated.   VITRECTOMY       Social History:   reports that he has never smoked. He has never been exposed to tobacco smoke. He has never used smokeless tobacco. He reports that he does not drink alcohol  and does not use drugs.   Family History:  His family history includes Coronary artery disease in his mother; Heart attack in his mother; Hypertension in his mother; Neuropathy in his mother; Pulmonary fibrosis in his father.   Allergies Allergies  Allergen Reactions   Brimonidine Tartrate Shortness Of Breath    Alphagan-Shortness of breath   Brinzolamide Shortness Of Breath    AZOPT- Shortness of breath   Latanoprost Shortness Of Breath    XALATAN- Shortness of breath   Nucynta  [Tapentadol ] Shortness Of Breath   Sulfa Antibiotics Palpitations   Timolol Maleate Shortness Of Breath and Other (See Comments)    TIMOPTIC- Aggravated asthma   Diltiazem Swelling     leg swelling   Rofecoxib Swelling     VIOXX- leg swelling   Vancomycin Rash and  Other (See Comments)    Blisters   Codeine Other (See Comments)    Childhood reaction   Tamsulosin Other (See Comments)    Dizziness    Celecoxib Other (See Comments)    CELLBREX-confusion   Colchicine Diarrhea    diarrhea   Tape Rash     Home Medications  Prior to Admission medications   Medication Sig Start Date End Date Taking? Authorizing Provider  albuterol  (VENTOLIN  HFA) 108 (90 Base) MCG/ACT inhaler TAKE 2 PUFFS BY MOUTH EVERY 6 HOURS AS NEEDED FOR WHEEZE OR SHORTNESS OF BREATH 03/04/24  Yes McGowen, Aleene DEL, MD  allopurinol  (ZYLOPRIM ) 300 MG tablet Take 1 tablet (300 mg total) by mouth daily. with food Patient taking differently: Take 300 mg by mouth every evening. 12/12/23  Yes McGowen, Aleene DEL, MD  amLODipine (NORVASC) 5 MG tablet Take 5 mg by mouth in the morning and at bedtime. 03/28/24 06/26/24 Yes [provider]  amoxicillin  (AMOXIL ) 500 MG capsule Take 2 capsules (1,000 mg total) by mouth 2 (two) times daily. 03/17/24  Yes Fleeta Rothman, Jomarie SAILOR, MD  apixaban  (ELIQUIS ) 5 MG TABS tablet Take 5 mg by mouth 2 (two) times daily.   Yes [provider]  ASPIRIN  LOW DOSE 81 MG EC tablet Take 81 mg by mouth daily. 02/03/19  Yes [provider]  B Complex-C (B-COMPLEX WITH VITAMIN C ) tablet Take 1 tablet by mouth daily. Take 1 tablet daily, 250mg    Yes [provider]  carvedilol (COREG) 3.125 MG tablet Take 3.125 mg by mouth 2 (two) times daily with a meal. 03/28/24 06/26/24 Yes [provider]  clotrimazole -betamethasone  (LOTRISONE ) cream Apply 1 Application topically daily. 03/28/24 03/28/25 Yes [provider]  ezetimibe  (ZETIA ) 10 MG tablet Take 1 tablet (10 mg total) by mouth daily. 12/16/19  Yes McGowen, Aleene DEL, MD  ferrous gluconate  (FERGON) 324 MG tablet Take 1 tablet (324 mg total) by mouth 2 (two) times daily. 02/19/24  Yes McGowen, Aleene DEL, MD  FIBER  PO Take 5 capsules by mouth in the morning and at bedtime.   Yes [provider]  finasteride  (PROSCAR ) 5 MG tablet Take 1 tablet (5 mg total) by mouth daily. 02/19/24  Yes McGowen, Aleene DEL, MD  fluticasone  (FLONASE ) 50 MCG/ACT nasal spray Place 2 sprays into both nostrils daily. Patient taking differently: Place 2 sprays into both nostrils daily as needed for allergies. 03/06/24  Yes McGowen, Aleene DEL, MD  fluticasone -salmeterol (WIXELA INHUB) 250-50 MCG/ACT AEPB Inhale 1 puff into the lungs 2 (two) times daily. in the morning and at bedtime. 12/12/23  Yes McGowen, Aleene DEL, MD  gabapentin  (NEURONTIN ) 800 MG tablet TAKE 1 TABLET BY MOUTH FOUR TIMES A DAY Patient taking differently: Take 800 mg by mouth 2 (two) times daily. 10/29/23  Yes McGowen, Aleene DEL, MD  loratadine (CLARITIN) 10 MG tablet Take 10 mg by mouth daily.   Yes [provider]  losartan  (COZAAR ) 25 MG tablet Take 25 mg by mouth daily. 03/29/24 06/27/24 Yes [provider]  mometasone -formoterol  (DULERA ) 100-5 MCG/ACT AERO Inhale 2 puffs into the lungs as needed for wheezing or shortness of breath.   Yes [provider]  Multiple Vitamin (MULTIVITAMIN ADULT PO) Take 2 tablets by mouth daily.   Yes [provider]  naloxone  (NARCAN ) nasal spray 4 mg/0.1 mL One spray in nostril as needed for opoid overdose.  May repeat dose in 5 min if needed. Patient taking differently: Place 0.4 mg into the nose as needed (OD). 06/08/23  Yes McGowen, Aleene DEL, MD  niacin  (NIASPAN ) 1000 MG CR tablet TAKE 1 TABLET BY MOUTH TWICE A DAY 03/17/24  Yes McGowen, Aleene DEL, MD  nitroGLYCERIN  (NITROSTAT ) 0.4 MG SL tablet Place 0.4 mg under the tongue every 5 (five) minutes as needed for chest pain. 09/04/17  Yes [provider]  Oxycodone  HCl 20 MG TABS 1 tab po bid prn severe pain Patient taking differently: Take 20 mg by mouth daily. 03/21/24  Yes McGowen, Aleene DEL, MD  pantoprazole  (PROTONIX ) 40 MG tablet Take 1 tablet (40 mg total) by mouth daily. Patient taking differently: Take 40 mg  by mouth 2 (two) times daily. 09/01/22  Yes McGowen, Aleene DEL, MD  potassium chloride  SA (KLOR-CON  M) 20 MEQ tablet Take 1 tablet (20 mEq total) by mouth daily. Patient taking differently: Take 10 mEq by mouth 2 (two) times daily. 12/12/23  Yes McGowen, Aleene DEL, MD  ranolazine (RANEXA) 500 MG 12 hr tablet Take 500 mg by mouth 2 (two) times daily. 03/28/24 06/26/24 Yes [provider]  rosuvastatin  (CRESTOR ) 20 MG tablet Take 20 mg by mouth daily. 10/09/22  Yes [provider]  torsemide  (DEMADEX ) 20 MG tablet Take 1 tablet (20 mg total) by mouth 2 (two) times daily. Patient taking differently: Take 20 mg by mouth daily. 12/12/23  Yes McGowen, Aleene DEL, MD  VITAMIN D, CHOLECALCIFEROL, PO Take 1 tablet by mouth daily.    Yes [provider]  diclofenac  (VOLTAREN ) 75 MG EC tablet Take 1 tablet (75 mg total) by mouth 2 (two) times daily. Patient not taking: Reported on 04/10/2024 02/12/24   Valdemar Rocky SAUNDERS, NP     Critical care time: 35 minutes    The patient is critically ill due to shock.  Critical care was necessary to treat or prevent imminent or life-threatening deterioration.  Critical care was time spent personally by me on the following activities: development of treatment plan with patient and/or surrogate as well as nursing, discussions  with consultants, evaluation of patient's response to treatment, examination of patient, obtaining history from patient or surrogate, ordering and performing treatments and interventions, ordering and review of laboratory studies, ordering and review of radiographic studies, pulse oximetry, re-evaluation of patient's condition and participation in multidisciplinary rounds.   Critical Care Time devoted to patient care services described in this note is 35 minutes. This time reflects time of care of this signee [facility] . This critical care time does not reflect separately billable procedures or procedure time, teaching time or  supervisory time of PA/NP/Med student/Med Resident etc but could involve care discussion time.       [facility] Pulmonary and Critical Care Medicine 04/12/2024 6:13 PM  Pager: see AMION If no response to pager , please call critical care on call (see AMION) until 7pm After 7:00 pm call Elink

## 2024-04-13 ENCOUNTER — Encounter (HOSPITAL_COMMUNITY)

## 2024-04-13 LAB — CBC
HCT: 27.3 % — ABNORMAL LOW (ref 39.0–52.0)
Hemoglobin: 9.6 g/dL — ABNORMAL LOW (ref 13.0–17.0)
MCH: 33.3 pg (ref 26.0–34.0)
MCHC: 35.2 g/dL (ref 30.0–36.0)
MCV: 94.8 fL (ref 80.0–100.0)
Platelets: 105 K/uL — ABNORMAL LOW (ref 150–400)
RBC: 2.88 MIL/uL — ABNORMAL LOW (ref 4.22–5.81)
RDW: 13.9 % (ref 11.5–15.5)
WBC: 7.3 K/uL (ref 4.0–10.5)
nRBC: 0 % (ref 0.0–0.2)

## 2024-04-13 LAB — BASIC METABOLIC PANEL WITH GFR
Anion gap: 10 (ref 5–15)
BUN: 26 mg/dL — ABNORMAL HIGH (ref 8–23)
CO2: 25 mmol/L (ref 22–32)
Calcium: 8.5 mg/dL — ABNORMAL LOW (ref 8.9–10.3)
Chloride: 100 mmol/L (ref 98–111)
Creatinine, Ser: 1.01 mg/dL (ref 0.61–1.24)
GFR, Estimated: >60 mL/min (ref >60)
Glucose, Bld: 125 mg/dL — ABNORMAL HIGH (ref 70–99)
Potassium: 3.3 mmol/L — ABNORMAL LOW (ref 3.5–5.1)
Sodium: 135 mmol/L (ref 135–145)

## 2024-04-13 LAB — GLUCOSE, CAPILLARY
Glucose-Capillary: 125 mg/dL — ABNORMAL HIGH (ref 70–99)
Glucose-Capillary: 113 mg/dL — ABNORMAL HIGH (ref 70–99)
Glucose-Capillary: 134 mg/dL — ABNORMAL HIGH (ref 70–99)
Glucose-Capillary: 110 mg/dL — ABNORMAL HIGH (ref 70–99)
Glucose-Capillary: 124 mg/dL — ABNORMAL HIGH (ref 70–99)

## 2024-04-13 LAB — MAGNESIUM: Magnesium: 1.7 mg/dL (ref 1.7–2.4)

## 2024-04-13 MED ORDER — GABAPENTIN 300 MG PO CAPS
300.0000 mg | ORAL_CAPSULE | Freq: Three times a day (TID) | ORAL | Status: DC
  Administered 2024-04-13 – 2024-04-17 (×13): 300 mg via ORAL
  Filled 2024-04-13 (×13): qty 1

## 2024-04-13 MED ORDER — FINASTERIDE 5 MG PO TABS
5.0000 mg | ORAL_TABLET | Freq: Every day | ORAL | Status: DC
  Administered 2024-04-13 – 2024-04-18 (×6): 5 mg via ORAL
  Filled 2024-04-13 (×6): qty 1

## 2024-04-13 MED ORDER — POTASSIUM CHLORIDE CRYS ER 20 MEQ PO TBCR
40.0000 meq | EXTENDED_RELEASE_TABLET | ORAL | Status: AC
  Administered 2024-04-13 (×2): 40 meq via ORAL
  Filled 2024-04-13 (×2): qty 2

## 2024-04-13 MED ORDER — MAGNESIUM SULFATE 2 GM/50ML IV SOLN
2.0000 g | Freq: Once | INTRAVENOUS | Status: AC
  Administered 2024-04-13: 2 g via INTRAVENOUS
  Filled 2024-04-13: qty 50

## 2024-04-13 MED ORDER — OXYCODONE HCL 5 MG PO TABS
5.0000 mg | ORAL_TABLET | ORAL | Status: DC | PRN
  Administered 2024-04-13 – 2024-04-17 (×14): 5 mg via ORAL
  Filled 2024-04-13 (×14): qty 1

## 2024-04-13 MED ORDER — MELATONIN 3 MG PO TABS
3.0000 mg | ORAL_TABLET | Freq: Every day | ORAL | Status: DC
  Administered 2024-04-13 – 2024-04-17 (×5): 3 mg via ORAL
  Filled 2024-04-13 (×5): qty 1

## 2024-04-13 NOTE — Progress Notes (Signed)
 eLink Physician-Brief Progress Note Patient Name: Tim [name] DOB: 04-19-1941 MRN: [id number]   Date of Service  04/13/2024  HPI/Events of Note  Requesting something more for his neuropathic pain, renal function has improved.  Normally on gabapentin  800 mg twice daily at home  Restarted gabapentin  100 mg 3 times daily  eICU Interventions  Will escalate the gabapentin  dosing to 300 mg 3 times daily with first dose now     Intervention Category Minor Interventions: Routine modifications to care plan (e.g. PRN medications for pain, fever)  Thaddus Mcdowell 04/13/2024, 6:03 AM

## 2024-04-13 NOTE — Progress Notes (Signed)
 [facility] ADULT ICU REPLACEMENT PROTOCOL   The patient does apply for the [facility] Adult ICU Electrolyte Replacment Protocol based on the criteria listed below:   1.Exclusion criteria: TCTS, ECMO, Dialysis, and Myasthenia Gravis patients 2. Is GFR >/= 30 ml/min? Yes.    Patient's GFR today is >60 3. Is SCr </= 2? No. Patient's SCr is 1.01 mg/dL 4. Did SCr increase >/= 0.5 in 24 hours? No. 5.Pt's weight >40kg  Yes.   6. Abnormal electrolyte(s): K+ 3.3, Mag 1.7  7. Electrolytes replaced per protocol 8.  Call MD STAT for K+ </= 2.5, Phos </= 1, or Mag </= 1 Physician:  Dr. Haze Rummer, Recardo ORN 04/13/2024 3:51 AM

## 2024-04-13 NOTE — Plan of Care (Signed)
  Problem: Clinical Measurements: Goal: Ability to maintain clinical measurements within normal limits will improve Outcome: Progressing Goal: Diagnostic test results will improve Outcome: Progressing Goal: Respiratory complications will improve Outcome: Progressing Goal: Cardiovascular complication will be avoided Outcome: Progressing   Problem: Activity: Goal: Risk for activity intolerance will decrease Outcome: Not Progressing   Problem: Nutrition: Goal: Adequate nutrition will be maintained Outcome: Not Progressing   Problem: Coping: Goal: Level of anxiety will decrease Outcome: Not Progressing   Problem: Elimination: Goal: Will not experience complications related to bowel motility Outcome: Not Progressing   Problem: Pain Managment: Goal: General experience of comfort will improve and/or be controlled Outcome: Not Progressing   Problem: Skin Integrity: Goal: Risk for impaired skin integrity will decrease Outcome: Not Progressing   Problem: Skin Integrity: Goal: Risk for impaired skin integrity will decrease Outcome: Not Progressing

## 2024-04-13 NOTE — Progress Notes (Signed)
 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  His wife notes over the weekend he was a lot little more confused.  He had worsening neuropathy symptoms and was taking extra gabapentin  and extra oxycodone .  She then notes over the next couple of days of the decreased urine output, decreased oral intake.  She checked his blood pressure at home and it was reading low.  At 1 point he was having such severe neuropathy that he fell and developed a left leg laceration.  The bleeding on this has since stopped.  His wife called EMS overnight due to the confusion and ended up bringing him in.  The patient was confused and lethargic when he came in but since receiving some IV fluids and vasopressors he seems more alert.  He is able to deny any fevers chills night sweats or weight loss.  He denies any cough or shortness of breath or chest pain.  He has some mild dysuria.  No abdominal pain or nausea.  No new skin rashes other than the laceration.  PCCM is consulted on this patient with hypotension requiring vasopressors and confusion.   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  Hematuria continues.  Hemoglobin is slightly down at 9.6.  Hemodynamically stable.  He is ready to be transferred out of ICU.  TEE is planned tomorrow.  Objective    Blood pressure 113/62, pulse 84, temperature 99.3 F (37.4 C), resp. rate 12, height 6' 4 (1.93 m), weight 114.1 kg, SpO2 95%.    FiO2 (%):  [21 %] 21 %   Intake/Output Summary (Last 24 hours) at 04/13/2024 0939 Last data filed at 04/13/2024 0600 Gross per 24 hour  Intake 759.6 ml  Output 1900 ml  Net -1140.4 ml   Filed Weights   04/11/24 0500 04/12/24 0456 04/13/24 0500  Weight: 117.1 kg 117.8 kg 114.1 kg    Examination: General: Alert and oriented no acute distress. HENT: Moist mucous membranes Lungs: Clear lungs bilaterally Cardiovascular: RRR, systolic murmur S1-S2 heard Abdomen: obese, soft, nontender Extremities: Venous eczema noted both legs.  Proved edema.Left toe amputations noted. Neuro: awake, alert, intermittently follows commands.   GU: no scrotal edema.  Foley shows bloody urine.   Resolved problem list   Assessment and Plan   Septic vs Hypovolemic Shock -resolved H/o Endocarditis H/o Atrial Fibrillation - Status post IVF resuscitation - vasopressors peripherall with norepinephrine goal MAP>65  - Off Levophed today - blood cultures growth to date - UA negative -Chest x-ray shows no pneumonia. - Appreciate ID input on empiric Rocephin  and Zyvox  -Appreciate cardiology input - Patient currently is on Eliquis  which will be held given the hematuria.  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 - Repeat echocardiogram on 10/9 showed possible vegetation versus calcification on the anterior  leaflet. -Consulted cardiology - hold diuretics, anti-hypertensive medications. - Off Eliquis  because of hematuria - Appreciate cardiology input plans for TEE noted  Oliguric AKI - resolved Hematuria - likely pre-renal vs intrinsic -Urine output is improving - Urinalysis is negative - Monitor renal function, electrolytes and urine output. - Appreciate urology input  OSA on CPAP - nocturnal cpap, per home settings  Code status - verified DNR/DNI   Chronic pain on oxycodone  and gabapentin  at home Myoclonus/Jerking -resolved Suspect gabapentin  accumulation due to renal failure Low-dose Neurontin  is restarted -dose increased overnight Foot x-ray shows no osteomyelitis  BPH - proscar  at home -  may need foley  Gout Hold allopurinol  given AKI  Prophylaxis already on Eliquis  this will be held given hematuria. CODE STATUS DNR/DNI Wife is in the room spoke with her and updated. Monitor nutritional status.  Transfer out of ICU today TRH to pick up tomorrow.   Labs   CBC: Recent Labs  Lab 04/10/24 0548 04/10/24 0601 04/11/24 0725 04/12/24 0244 04/13/24 0202  WBC 9.0  --  10.2 9.8 7.3  NEUTROABS 6.2  --   --  7.1  --   HGB 11.9* 11.9* 11.7* 10.4* 9.6*  HCT 35.0* 35.0* 33.3* 29.9* 27.3*  MCV 96.4  --  93.5 94.9 94.8  PLT 153  --  148* 134* 105*    Basic Metabolic Panel: Recent Labs  Lab 04/10/24 0548 04/10/24 0601 04/11/24 0725 04/12/24 0244 04/13/24 0202  NA 130* 130* 133* 134* 135  K 4.9 5.5* 3.8 4.0 3.3*  CL 91* 95* 95* 95* 100  CO2 25  --  23 29 25   GLUCOSE 144* 140* 173* 143* 125*  BUN 83* 97* 63* 45* 26*  CREATININE 3.12* 3.30* 2.23* 1.54* 1.01  CALCIUM  9.0  --  8.7* 8.7* 8.5*  MG  --   --  1.6* 2.0 1.7  PHOS  --   --  3.8  --   --    GFR: Estimated Creatinine Clearance: 75.2 mL/min (by C-G formula based on SCr of 1.01 mg/dL). Recent Labs  Lab 04/10/24 0548 04/10/24 0558 04/10/24 0841 04/10/24 0844 04/10/24 0942 04/10/24 1306  04/11/24 0725 04/12/24 0244 04/13/24 0202  PROCALCITON  --   --   --   --  0.13  --   --   --   --   WBC 9.0  --   --   --   --   --  10.2 9.8 7.3  LATICACIDVEN  --    < > 2.0* 0.8 0.7 0.9  --   --   --    < > = values in this interval not displayed.    Liver Function Tests: Recent Labs  Lab 04/10/24 0548  AST 75*  ALT 36  ALKPHOS 90  BILITOT 0.9  PROT 6.5  ALBUMIN 3.4*   No results for input(s): LIPASE, AMYLASE in the last 168 hours. No results for input(s): AMMONIA in the last 168 hours.  ABG    Component Value Date/Time   PHART 7.397 12/24/2012 1719   PCO2ART 40.9 12/24/2012 1719   PO2ART 87.0 12/24/2012 1719   HCO3 26.7 (H) 12/24/2012 1726  TCO2 29 04/10/2024 0601   O2SAT 69.0 12/24/2012 1726     Coagulation Profile: Recent Labs  Lab 04/10/24 0618  INR 1.9*    Cardiac Enzymes: Recent Labs  Lab 04/10/24 0942  CKTOTAL 53    HbA1C: Hemoglobin A1C  Date/Time Value Ref Range Status  03/06/2024 02:50 PM 5.8 (A) 4.0 - 5.6 % Final  06/08/2023 03:20 PM 5.5 4.0 - 5.6 % Final   HbA1c, POC (prediabetic range)  Date/Time Value Ref Range Status  03/06/2024 02:50 PM 5.8 5.7 - 6.4 % Final  06/08/2023 03:20 PM 5.5 (A) 5.7 - 6.4 % Final   HbA1c, POC (controlled diabetic range)  Date/Time Value Ref Range Status  03/06/2024 02:50 PM 5.8 0.0 - 7.0 % Final  06/08/2023 03:20 PM 5.5 0.0 - 7.0 % Final   HbA1c POC (<> result, manual entry)  Date/Time Value Ref Range Status  03/06/2024 02:50 PM 5.8 4.0 - 5.6 % Final  06/08/2023 03:20 PM 5.5 4.0 - 5.6 % Final    CBG: Recent Labs  Lab 04/12/24 1735 04/12/24 1951 04/12/24 2347 04/13/24 0343 04/13/24 0739  GLUCAP 136* 191* 154* 125* 113*    Review of Systems:   + confusion, dizziness, neuropathy, dysuria - fevers chills night sweats hematuria Rest per HPI above Past Medical History:  He,  has a past medical history of AICD (automatic cardioverter/defibrillator) present, Balanitis, BPH (benign  prostatic hypertrophy), CAD (coronary artery disease), CATARACT, HX OF, Chronic combined systolic and diastolic CHF (congestive heart failure) (HCC) (05/2016), Chronic pain syndrome, Complicated UTI (urinary tract infection) (09/2019), COPD, Debilitated patient, Diabetes mellitus type 2 with complications (HCC), Enterococcal bacteremia (09/2019), Essential hypertension, benign, Fatty liver (2007), Generalized weakness, GERD (gastroesophageal reflux disease), Glaucoma, GOUT, HH (hiatus hernia), HYPERCHOLESTEROLEMIA-PURE, Hypogonadism male, ICD (implantable cardioverter-defibrillator) infection (12/22/2019), 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), Iron deficiency anemia, Lumbosacral neuritis, Lumbosacral spondylosis, Morbid obesity (HCC), Normal memory function (08/2014), Normocytic anemia (03/2016), NSTEMI (non-ST elevated myocardial infarction) (HCC) (03/20/2016), Obesity hypoventilation syndrome (HCC), Orthostatic hypotension, OSA on CPAP, OSTEOARTHRITIS, 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), Paroxysmal atrial fibrillation (HCC) (2003), Peripheral neuropathy, Personal history of colonic adenoma (10/30/2012), PFO (patent foramen ovale) (09/2019), Physical deconditioning (02/23/2021), Presence of cardiac defibrillator (11/07/2017), Primary osteoarthritis of both knees, 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   Surgical History:   Past Surgical History:  Procedure Laterality Date   AMPUTATION Left 04/11/2013   Procedure: AMPUTATION DIGIT Left 3rd toe;   Surgeon: Jerona LULLA Sage, MD;  Location: [facility] OR;  Service: Orthopedics;  Laterality: Left;  Left 3rd toe amputation at [facility]   AMPUTATION Left 08/29/2019   Procedure: LEFT TRANSMETATARSAL AMPUTATION;  Surgeon: Sage Jerona LULLA, MD;  Location: [facility] OR;  Service: Orthopedics;  Laterality: Left;   BIOPSY  05/04/2020   Procedure: BIOPSY;  Surgeon: Charlanne Groom, MD;  Location: [name] ENDOSCOPY;  Service: Endoscopy;;   CARDIAC CATHETERIZATION  1997; 03/10/16   1997 Non-obstructive disease.  03/2016 BMS to RCA, with 25% pDiag dz, o/w normal cors per cath 03/07/16.  Cath 11/01/16: in stent restenosis, successful baloon angioplasty. 08/2020 branch vessel dz, cont medical therapy rec'd.   CARDIAC CATHETERIZATION  12/24/2012   mild < 20% LCx, prox 30% RCA; LVEF 55-65% , moderate pulmonary  HTN, moderate AS   CARDIAC DEFIBRILLATOR PLACEMENT  11/07/2017   [facility] MRI Quad CRT defibrillator   CARDIOVASCULAR STRESS TEST  05/11/16 ([facility])   2017 Myocardial perfusion imaging:  No ischemia; scar in apex, global hypokinesis, EF 36%.  06/15/20->mod primarily fixed inferolat wall defect mildly worse with stress c/w infarct/scar with mild peri-infarct ischemia, normal EF-->for cath per cards.   Carotid dopplers  03/09/2016   [facility]: no hemodynamically significant stenosis on either side.   CHOLECYSTECTOMY     COLONOSCOPY [name] 10/30/2012   Procedure: COLONOSCOPY;  Surgeon: Lupita FORBES Commander, MD;  Location: [facility] ENDOSCOPY;  Service: Endoscopy;  Laterality: [name];   CORONARY ANGIOPLASTY WITH STENT PLACEMENT  03/2016; 04/2017   2017-[facility]: BMS to RCA-pt was placed on Brilinta.  04/2017: DES to RCA.   DORSAL SLIT [name] 10/29/2019   for severe phimosis. Procedure: DORSAL SLIT;  Surgeon: Alvaro Hummer, MD;  Location: [facility] ORS;  Service: Urology;  Laterality: [name];  45 MINS   ESOPHAGEAL DILATION  05/04/2020   Procedure: ESOPHAGEAL DILATION;  Surgeon: Charlanne Groom, MD;  Location: [facility] ENDOSCOPY;  Service: Endoscopy;;   ESOPHAGOGASTRODUODENOSCOPY (EGD)  WITH PROPOFOL  [name] 05/04/2020   Procedure: ESOPHAGOGASTRODUODENOSCOPY (EGD) WITH PROPOFOL ;  Surgeon: Charlanne Groom, MD;  Location: [facility] ENDOSCOPY;  Service: Endoscopy;  Laterality: [name];   EYE SURGERY Bilateral cataract   HEMORRHOID SURGERY     INTRAOCULAR LENS INSERTION Bilateral    KNEE SURGERY Right    LEFT AND RIGHT HEART CATHETERIZATION WITH CORONARY ANGIOGRAM [name] 12/24/2012   Procedure: LEFT AND RIGHT HEART CATHETERIZATION WITH CORONARY ANGIOGRAM;  Surgeon: Peter M Swaziland, MD;  Location: [facility] CATH LAB;  Service: Cardiovascular;  Laterality: [name];   LEG SURGERY Bilateral    lenghtening    PACEMAKER PLACEMENT  04/13/2016   2nd deg HB after TAVR, pt had DC MDT PPM placed.   SHOULDER ARTHROSCOPY  08/30/2011   Procedure: ARTHROSCOPY SHOULDER;  Surgeon: Jerona LULLA Sage, MD;  Location: [name] OR;  Service: Orthopedics;  Laterality: Right;  Right Shoulder Arthroscopy, Debridement, and Decompression   SPINAL CORD STIMULATOR INSERTION [name] 09/10/2015   Procedure: LUMBAR SPINAL CORD STIMULATOR INSERTION;  Surgeon: Deward Fabian, MD;  Location: [facility] NEURO ORS;  Service: Neurosurgery;  Laterality: [name];   TEE WITHOUT CARDIOVERSION [name] 09/16/2019   Procedure: TRANSESOPHAGEAL ECHOCARDIOGRAM (TEE);  Surgeon: Raford Riggs, MD;  Location: [facility] ENDOSCOPY;  Service: Cardiovascular;  Laterality: [name];   TEE WITHOUT CARDIOVERSION [name] 12/02/2019   +vegetation on AVR and pacer lead in RV.  EF 55-60%, normal wall motion.  Valves function normal.  Procedure: TRANSESOPHAGEAL ECHOCARDIOGRAM (TEE);  Surgeon: Barbaraann Darryle Ned, MD;  Location: [facility] ENDOSCOPY;  Service: Cardiovascular;  Laterality: [name];   TOE AMPUTATION Left    due to osteomyelitis.  R big toe surg due to osteoarth   TONSILLECTOMY     traeculectomy Left    eye   TRANSCATHETER AORTIC VALVE REPLACEMENT, TRANSFEMORAL  04/11/2016   TRANSESOPHAGEAL ECHOCARDIOGRAM  03/09/2016; 09/2019   [facility]: EF 55-60%, PFO seen with bi-directional shunting, no thrombus in appendage.   09/2019 ->no valvular vegetations. Small patent foramen ovale with predominantly left to right shunting across the interatrial septum.   TRANSTHORACIC ECHOCARDIOGRAM  01/2015; 01/2016; 05/18/16; 09/18/16, 05/2017, 08/2017   01/2015 No signif change in aortic stenosis (moderate).  01/2016 Severe LVH w/small LV cavity, EF 60-65%, grade I diast dysfxn.  05/2016 (s/p TAVR): EF 50-55%, grd I DD, biopros AV good.  08/2016--EF 50-55%, LV septal motion c/w conduction abnl, grd I DD,mild MS,bioprosth aortic valve well  seated, w/trace AR. 05/2017 TTE EF 35%. 08/2017-EF 35%, mod diff hypokin LV, grd I DD, biopros AV good.    TRANSTHORACIC ECHOCARDIOGRAM  04/2018; 09/2019   04/2018: EF 40-45%, mod diffuse LV hypokin, grd I DD, bioprosth AV well seated, no AS or AR. 09/2019 EF 60-65%, grd I DD, valves fine, including bioprosth AV. 04/29/20 (tech diff) EF 55-60%, grd I DD, vegetation on MV.  07/2021 EF 55-60% (s/p upgrade to BiV PPM), AV well seated.   VITRECTOMY       Social History:   reports that he has never smoked. He has never been exposed to tobacco smoke. He has never used smokeless tobacco. He reports that he does not drink alcohol  and does not use drugs.   Family History:  His family history includes Coronary artery disease in his mother; Heart attack in his mother; Hypertension in his mother; Neuropathy in his mother; Pulmonary fibrosis in his father.   Allergies Allergies  Allergen Reactions   Brimonidine Tartrate Shortness Of Breath    Alphagan-Shortness of breath   Brinzolamide Shortness Of Breath    AZOPT- Shortness of breath   Latanoprost Shortness Of Breath    XALATAN- Shortness of breath   Nucynta  [Tapentadol ] Shortness Of Breath   Sulfa Antibiotics Palpitations   Timolol Maleate Shortness Of Breath and Other (See Comments)    TIMOPTIC- Aggravated asthma   Diltiazem Swelling     leg swelling   Rofecoxib Swelling     VIOXX- leg swelling   Vancomycin Rash and Other (See Comments)    Blisters    Codeine Other (See Comments)    Childhood reaction   Tamsulosin Other (See Comments)    Dizziness    Celecoxib Other (See Comments)    CELLBREX-confusion   Colchicine Diarrhea    diarrhea   Tape Rash     Home Medications  Prior to Admission medications   Medication Sig Start Date End Date Taking? Authorizing Provider  albuterol  (VENTOLIN  HFA) 108 (90 Base) MCG/ACT inhaler TAKE 2 PUFFS BY MOUTH EVERY 6 HOURS AS NEEDED FOR WHEEZE OR SHORTNESS OF BREATH 03/04/24  Yes McGowen, Aleene DEL, MD  allopurinol  (ZYLOPRIM ) 300 MG tablet Take 1 tablet (300 mg total) by mouth daily. with food Patient taking differently: Take 300 mg by mouth every evening. 12/12/23  Yes McGowen, Aleene DEL, MD  amLODipine (NORVASC) 5 MG tablet Take 5 mg by mouth in the morning and at bedtime. 03/28/24 06/26/24 Yes [provider]  amoxicillin  (AMOXIL ) 500 MG capsule Take 2 capsules (1,000 mg total) by mouth 2 (two) times daily. 03/17/24  Yes Fleeta Rothman, Jomarie SAILOR, MD  apixaban  (ELIQUIS ) 5 MG TABS tablet Take 5 mg by mouth 2 (two) times daily.   Yes [provider]  ASPIRIN  LOW DOSE 81 MG EC tablet Take 81 mg by mouth daily. 02/03/19  Yes [provider]  B Complex-C (B-COMPLEX WITH VITAMIN C ) tablet Take 1 tablet by mouth daily. Take 1 tablet daily, 250mg    Yes [provider]  carvedilol (COREG) 3.125 MG tablet Take 3.125 mg by mouth 2 (two) times daily with a meal. 03/28/24 06/26/24 Yes [provider]  clotrimazole -betamethasone  (LOTRISONE ) cream Apply 1 Application topically daily. 03/28/24 03/28/25 Yes [provider]  ezetimibe  (ZETIA ) 10 MG tablet Take 1 tablet (10 mg total) by mouth daily. 12/16/19  Yes McGowen, Aleene DEL, MD  ferrous gluconate  (FERGON) 324 MG tablet Take 1 tablet (324 mg total) by mouth 2 (two) times daily. 02/19/24  Yes  McGowen, Aleene DEL, MD  FIBER PO Take 5 capsules by mouth in the morning and at bedtime.   Yes [provider]  finasteride   (PROSCAR ) 5 MG tablet Take 1 tablet (5 mg total) by mouth daily. 02/19/24  Yes McGowen, Aleene DEL, MD  fluticasone  (FLONASE ) 50 MCG/ACT nasal spray Place 2 sprays into both nostrils daily. Patient taking differently: Place 2 sprays into both nostrils daily as needed for allergies. 03/06/24  Yes McGowen, Aleene DEL, MD  fluticasone -salmeterol (WIXELA INHUB) 250-50 MCG/ACT AEPB Inhale 1 puff into the lungs 2 (two) times daily. in the morning and at bedtime. 12/12/23  Yes McGowen, Aleene DEL, MD  gabapentin  (NEURONTIN ) 800 MG tablet TAKE 1 TABLET BY MOUTH FOUR TIMES A DAY Patient taking differently: Take 800 mg by mouth 2 (two) times daily. 10/29/23  Yes McGowen, Aleene DEL, MD  loratadine (CLARITIN) 10 MG tablet Take 10 mg by mouth daily.   Yes [provider]  losartan  (COZAAR ) 25 MG tablet Take 25 mg by mouth daily. 03/29/24 06/27/24 Yes [provider]  mometasone -formoterol  (DULERA ) 100-5 MCG/ACT AERO Inhale 2 puffs into the lungs as needed for wheezing or shortness of breath.   Yes [provider]  Multiple Vitamin (MULTIVITAMIN ADULT PO) Take 2 tablets by mouth daily.   Yes [provider]  naloxone  (NARCAN ) nasal spray 4 mg/0.1 mL One spray in nostril as needed for opoid overdose.  May repeat dose in 5 min if needed. Patient taking differently: Place 0.4 mg into the nose as needed (OD). 06/08/23  Yes McGowen, Aleene DEL, MD  niacin  (NIASPAN ) 1000 MG CR tablet TAKE 1 TABLET BY MOUTH TWICE A DAY 03/17/24  Yes McGowen, Aleene DEL, MD  nitroGLYCERIN  (NITROSTAT ) 0.4 MG SL tablet Place 0.4 mg under the tongue every 5 (five) minutes as needed for chest pain. 09/04/17  Yes [provider]  Oxycodone  HCl 20 MG TABS 1 tab po bid prn severe pain Patient taking differently: Take 20 mg by mouth daily. 03/21/24  Yes McGowen, Aleene DEL, MD  pantoprazole  (PROTONIX ) 40 MG tablet Take 1 tablet (40 mg total) by mouth daily. Patient taking differently: Take 40 mg by mouth 2 (two) times daily.  09/01/22  Yes McGowen, Aleene DEL, MD  potassium chloride  SA (KLOR-CON  M) 20 MEQ tablet Take 1 tablet (20 mEq total) by mouth daily. Patient taking differently: Take 10 mEq by mouth 2 (two) times daily. 12/12/23  Yes McGowen, Aleene DEL, MD  ranolazine (RANEXA) 500 MG 12 hr tablet Take 500 mg by mouth 2 (two) times daily. 03/28/24 06/26/24 Yes [provider]  rosuvastatin  (CRESTOR ) 20 MG tablet Take 20 mg by mouth daily. 10/09/22  Yes [provider]  torsemide  (DEMADEX ) 20 MG tablet Take 1 tablet (20 mg total) by mouth 2 (two) times daily. Patient taking differently: Take 20 mg by mouth daily. 12/12/23  Yes McGowen, Aleene DEL, MD  VITAMIN D, CHOLECALCIFEROL, PO Take 1 tablet by mouth daily.    Yes [provider]  diclofenac  (VOLTAREN ) 75 MG EC tablet Take 1 tablet (75 mg total) by mouth 2 (two) times daily. Patient not taking: Reported on 04/10/2024 02/12/24   Valdemar Rocky SAUNDERS, NP     Critical care time: 31 minutes      [facility] Pulmonary and Critical Care Medicine 04/13/2024 9:39 AM  Pager: see AMION If no response to pager , please call critical care on call (see AMION) until 7pm After 7:00 pm call Elink

## 2024-04-14 ENCOUNTER — Inpatient Hospital Stay (HOSPITAL_COMMUNITY)

## 2024-04-14 ENCOUNTER — Ambulatory Visit: Admitting: Orthopedic Surgery

## 2024-04-14 DIAGNOSIS — M7989 Other specified soft tissue disorders: Secondary | ICD-10-CM

## 2024-04-14 DIAGNOSIS — E669 Obesity, unspecified: Secondary | ICD-10-CM

## 2024-04-14 DIAGNOSIS — T82857A Stenosis of cardiac prosthetic devices, implants and grafts, initial encounter: Secondary | ICD-10-CM

## 2024-04-14 DIAGNOSIS — R410 Disorientation, unspecified: Secondary | ICD-10-CM

## 2024-04-14 DIAGNOSIS — N179 Acute kidney failure, unspecified: Secondary | ICD-10-CM | POA: Diagnosis not present

## 2024-04-14 DIAGNOSIS — R579 Shock, unspecified: Secondary | ICD-10-CM | POA: Diagnosis not present

## 2024-04-14 DIAGNOSIS — T82857D Stenosis of cardiac prosthetic devices, implants and grafts, subsequent encounter: Secondary | ICD-10-CM | POA: Diagnosis not present

## 2024-04-14 LAB — BASIC METABOLIC PANEL WITH GFR
Anion gap: 10 (ref 5–15)
BUN: 12 mg/dL (ref 8–23)
CO2: 25 mmol/L (ref 22–32)
Calcium: 8.7 mg/dL — ABNORMAL LOW (ref 8.9–10.3)
Chloride: 103 mmol/L (ref 98–111)
Creatinine, Ser: 0.84 mg/dL (ref 0.61–1.24)
GFR, Estimated: >60 mL/min (ref >60)
Glucose, Bld: 113 mg/dL — ABNORMAL HIGH (ref 70–99)
Potassium: 3.4 mmol/L — ABNORMAL LOW (ref 3.5–5.1)
Sodium: 138 mmol/L (ref 135–145)

## 2024-04-14 LAB — GLUCOSE, CAPILLARY
Glucose-Capillary: 129 mg/dL — ABNORMAL HIGH (ref 70–99)
Glucose-Capillary: 115 mg/dL — ABNORMAL HIGH (ref 70–99)
Glucose-Capillary: 98 mg/dL (ref 70–99)
Glucose-Capillary: 98 mg/dL (ref 70–99)

## 2024-04-14 LAB — MAGNESIUM: Magnesium: 1.7 mg/dL (ref 1.7–2.4)

## 2024-04-14 MED ORDER — POTASSIUM CHLORIDE 20 MEQ PO PACK
40.0000 meq | PACK | Freq: Once | ORAL | Status: AC
  Administered 2024-04-14: 40 meq via ORAL
  Filled 2024-04-14: qty 2

## 2024-04-14 MED ORDER — ASPIRIN 81 MG PO TBEC
81.0000 mg | DELAYED_RELEASE_TABLET | Freq: Every day | ORAL | Status: DC
  Administered 2024-04-14 – 2024-04-18 (×5): 81 mg via ORAL
  Filled 2024-04-14 (×5): qty 1

## 2024-04-14 NOTE — Plan of Care (Signed)

## 2024-04-14 NOTE — Progress Notes (Signed)
 Patient ID: Tim [name], male   DOB: February 08, 1941, 83 y.o.   MRN: [id number]    Subjective: Pt voiding without difficulty.  Objective: Vital signs in last 24 hours: Temp:  [97.6 F (36.4 C)-99 F (37.2 C)] 97.6 F (36.4 C) (10/13 0751) Pulse Rate:  [79-93] 90 (10/13 0751) Resp:  [13-21] 16 (10/13 0751) BP: (96-122)/(49-72) 110/59 (10/13 0751) SpO2:  [92 %-100 %] 98 % (10/13 0751) Weight:  [116.3 kg] 116.3 kg (10/13 0400)  Intake/Output from previous day: 10/12 0701 - 10/13 0700 In: -  Out: 1800 [Urine:1800] Intake/Output this shift: No intake/output data recorded.  Physical Exam:  General: Alert and oriented GU: Urine now more tea colored.  Lab Results: Recent Labs    04/12/24 0244 04/13/24 0202  HGB 10.4* 9.6*  HCT 29.9* 27.3*   BMET Recent Labs    04/13/24 0202 04/14/24 0452  NA 135 138  K 3.3* 3.4*  CL 100 103  CO2 25 25  GLUCOSE 125* 113*  BUN 26* 12  CREATININE 1.01 0.84  CALCIUM  8.5* 8.7*     Studies/Results:   Assessment/Plan: 1) Hematuria:  Urine clearing and without concern for retention.  If reasonable, hold anticoagulation until urine is clear and then can restart.  I will arrange outpatient hematuria evaluation with cystoscopy and CT imaging.  Please call if further issues or concerns for worsening hematuria/clot retention.   LOS: 4 days   Noretta Ferrara 04/14/2024, 8:14 AM

## 2024-04-14 NOTE — Care Management Important Message (Signed)
 Important Message  Patient Details  Name: Tim [name] MRN: [id number] Date of Birth: [birth year]   Important Message Given:  Yes - Medicare IM     Claretta Deed 04/14/2024, 3:43 PM

## 2024-04-14 NOTE — Progress Notes (Signed)
 Patient states that he has a CPAP at home and is not compliant with CPAP. Patient is not ready to be placed on Cpap.

## 2024-04-14 NOTE — Progress Notes (Signed)
 Left lower extremity venous duplex has been completed.  Results can be found in chart review under CV Proc.  04/14/2024 10:37 AM  Paxtyn Wisdom Elden Appl, RVT.

## 2024-04-14 NOTE — Progress Notes (Signed)
 PHARMACY - PHYSICIAN COMMUNICATION CRITICAL VALUE ALERT - BLOOD CULTURE IDENTIFICATION (BCID)  Tim [name] is an 83 y.o. male who presented to [name] on 04/10/2024 with encephalopathy/septic. 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   Assessment:  1/4 blood cultures with gram positive rods (most likely contaminate)  Name of physician (or Provider) Contacted: Dr. Keturah  Current antibiotics: Ceftriaxone  2g IV q24h and linezolid  600mg  IV every 12 hours   Changes to prescribed antibiotics recommended:   -Continue current plans, follow up plan to de-escalate antibiotics  Tim [name], PharmD, BCPS Clinical Pharmacist 04/14/2024 5:04 AM

## 2024-04-14 NOTE — Progress Notes (Signed)
 Transition of Care [facility]) - Inpatient Brief Assessment   Patient Details  Name: Tim [name] MRN: [id number] Date of Birth: August 21, 1940  Transition of Care [facility]) CM/SW Contact:    Rosaline JONELLE Joe, RN Phone Number: 04/14/2024, 4:34 PM   Clinical Narrative: Patient admitted to the hospital with shock.  Patient is being followed by ID MD.  Per MD patient was seen by cardiology and is pending TEE tomorrow.  PT/OT pending at this time.  CM will continue to follow the patient for any needs.   Transition of Care Asessment: Insurance and Status: (P) Insurance coverage has been reviewed Patient has primary care physician: (P) Yes Home environment has been reviewed: (P) from home Prior level of function:: (P) self Prior/Current Home Services: (P) No current home services Social Drivers of Health Review: (P) SDOH reviewed no interventions necessary Readmission risk has been reviewed: (P) Yes Transition of care needs: (P) no transition of care needs at this time

## 2024-04-14 NOTE — Progress Notes (Signed)
 Progress Note  Patient Name: Tim [name] Date of Encounter: 04/14/2024  Primary Cardiologist:   Redell Shallow, MD   Subjective   BP 110/59.  Cr 0.84.  Denies any chest pain or dyspnea  Inpatient Medications    Scheduled Meds:  Chlorhexidine  Gluconate Cloth  6 each Topical Daily   feeding supplement  1 Container Oral TID BM   finasteride   5 mg Oral Daily   gabapentin   300 mg Oral TID   insulin  aspart  0-5 Units Subcutaneous QHS   insulin  aspart  0-9 Units Subcutaneous TID WC   lidocaine   1 patch Transdermal Q24H   melatonin  3 mg Oral QHS   nystatin    Topical BID   Continuous Infusions:  cefTRIAXone  (ROCEPHIN )  IV 2 g (04/13/24 1052)   linezolid  (ZYVOX ) IV 600 mg (04/14/24 0851)   PRN Meds: acetaminophen , docusate sodium , fentaNYL  (SUBLIMAZE ) injection, mouth rinse, oxyCODONE , polyethylene glycol   Vital Signs    Vitals:   04/13/24 2051 04/14/24 0026 04/14/24 0400 04/14/24 0751  BP: 121/69 (!) 118/59 (!) 115/54 (!) 110/59  Pulse: 93 89 86 90  Resp: 16 16 16 16   Temp: 98.2 F (36.8 C) 98.5 F (36.9 C) 98 F (36.7 C) 97.6 F (36.4 C)  TempSrc:      SpO2: 100% 98% 98% 98%  Weight:   116.3 kg   Height:        Intake/Output Summary (Last 24 hours) at 04/14/2024 1026 Last data filed at 04/14/2024 1000 Gross per 24 hour  Intake --  Output 2500 ml  Net -2500 ml   Filed Weights   04/12/24 0456 04/13/24 0500 04/14/24 0400  Weight: 117.8 kg 114.1 kg 116.3 kg    Telemetry    V paced- Personally Reviewed  ECG    NA - Personally Reviewed  Physical Exam   GEN: No acute distress.   Neck: No  JVD Cardiac: RRR, 3/6 apical systolic murmur, no diastolic murmurs, rubs, or gallops.  Respiratory:     Decreased breath sounds at the bases.  GI: Soft, nontender, non-distended  MS:    Diffuse upper extremity edema, mild lower edema; No deformity. Neuro:  Nonfocal  Psych: Normal affect   Labs    Chemistry Recent Labs  Lab 04/10/24 0548  04/10/24 0601 04/12/24 0244 04/13/24 0202 04/14/24 0452  NA 130*   < > 134* 135 138  K 4.9   < > 4.0 3.3* 3.4*  CL 91*   < > 95* 100 103  CO2 25   < > 29 25 25   GLUCOSE 144*   < > 143* 125* 113*  BUN 83*   < > 45* 26* 12  CREATININE 3.12*   < > 1.54* 1.01 0.84  CALCIUM  9.0   < > 8.7* 8.5* 8.7*  PROT 6.5  --   --   --   --   ALBUMIN 3.4*  --   --   --   --   AST 75*  --   --   --   --   ALT 36  --   --   --   --   ALKPHOS 90  --   --   --   --   BILITOT 0.9  --   --   --   --   GFRNONAA 19*   < > 44* >60 >60  ANIONGAP 14   < > 10 10 10    < > = values in this  interval not displayed.     Hematology Recent Labs  Lab 04/11/24 0725 04/12/24 0244 04/13/24 0202  WBC 10.2 9.8 7.3  RBC 3.56* 3.15* 2.88*  HGB 11.7* 10.4* 9.6*  HCT 33.3* 29.9* 27.3*  MCV 93.5 94.9 94.8  MCH 32.9 33.0 33.3  MCHC 35.1 34.8 35.2  RDW 14.2 14.2 13.9  PLT 148* 134* 105*    Cardiac EnzymesNo results for input(s): TROPONINI in the last 168 hours. No results for input(s): TROPIPOC in the last 168 hours.   BNP Recent Labs  Lab 04/10/24 0548  BNP 60.6     DDimer No results for input(s): DDIMER in the last 168 hours.   Radiology    No results found.   Cardiac Studies   See above  Patient Profile     83 y.o. 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  Cardiology consulted for severe prosthetic valve stenosis and possible mitral valve vegetation.    Assessment & Plan    Shock:  Thought to be related to hypovolemia and responsive to volume.  Blood cultures negative to date.  Holding home blood pressure medications.  He is now normotensive off Levophed.  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  Echocardiogram this admission shows EF 65 to 70%, normal RV function, possible vegetation versus calcium  on anterior mitral valve leaflet, severe bioprosthetic aortic valve stenosis with mean gradient 46 mmHg, V-max 4.6 m/s, EOA 0.8 cm. -TEE planned for tomorrow for further evaluation   Mitral valve lesion: This is thought to be calcification rather than active vegetation.  To further clarify with TEE.   AKI: Creatinine continues to improve with hydration, has resolved   CAD: No current acute coronary symptoms.  Admitted at [facility] 03/2024 with NSTEMI, showed 95% ostial diagonal stenosis, small vessel and medical management recommended.  Plan continued medical management.  Discontinued aspirin  as he is on Eliquis  chronically, would restart while eliquis  on hold due to history of coronary stenting   PAF: No recurrent paroxysms.  On Eliquis , currently on hold given hematuria   Complete heart block: Status post permanent pacemaker with normal function.  ICD interrogation 04/02/2024 showed battery at 4 months.   For questions or updates, please contact [facility] Please consult [email or web address] for contact info under Cardiology/STEMI.   Signed, Lonni LITTIE Nanas, MD  04/14/2024, 10:26 AM

## 2024-04-14 NOTE — Progress Notes (Signed)
 Progress Note   Patient: Tim [name] FMW:[id number] DOB: 1940/10/24 DOA: 04/10/2024     4 DOS: the patient was seen and examined on 04/14/2024   Brief hospital course: Tim [name] is a 83 year old man past medical history of coronary artery disease s/p PCI, TAVR with bioprosthetic endocarditis on suppressive amoxicillin  therapy, chronic pain on opioids.presented for confusion, hypotension, decreased urine output.  He was taking extra gabapentin  and extra oxycodone .  Has decreased oral intake.  Patient got IV fluid resuscitation, admitted to ICU for hypotension, AKI requiring vasopressors.  04/11/2024 echocardiogram showed possible vegetation versus calcium  on anterior mitral leaflet.  Cardiology and ID consulted.  Patient's blood pressure improved off pressors, transferred to TRH service 04/14/2024.  Assessment and Plan: Hypovolemic shock-resolved Possible septic shock- Patient is off pressors now. Low blood pressures could be from his pain meds, muscle relaxants, gabapentin  which he is taking extra doses. COVID/ flu negative. Blood cultures no growth to date. UA unremarkable, MRSA screen negative.  Echocardiogram possible mitral leaflet vegetation. DVT study negative noted. Continue IV antibiotics Rocephin  and Zyvox  therapy. Cardiology plan to do TEE tomorrow. PT/OT evaluation for dc planning.  History of CAD s/p PCI. 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 Status post ICD with CRT in 2019. Patient had left heart cath September 2025, showed 95% ostial diagonal stenosis, small vessel and medical management recommended. Repeat echocardiogram showed possible vegetation versus calcification on anterior mitral leaflet. Cardiology follow-up appreciated.  Hold diuretics and antihypertensive medications given low blood pressures upon presentation. He is off Eliquis  due to hematuria on presentation. Plan for TEE  tomorrow as per cardiology.  Atrial fibrillation: Rate controlled heart rate 79-93. Eliquis  on hold due to hematuria. Hold Coreg due to low blood pressures.  Acute kidney injury-prerenal In the setting of shock. Urine output gradually improving. Continue to monitor daily electrolytes, renal function. Avoid nephrotoxic drugs. Urology follow-up appreciated.  Patient need outpatient evaluation with hematuria protocol CT scan and cystoscopy per urology.  Hypokalemia- Oral potassium supplementation ordered.  Chronic pain syndrome- Patient is on oxycodone , and gabapentin  therapy at home. Caution with pain medications due to low blood pressures. He is at risk for polypharmacy.   BPH- Continue Proscar .  Foley as needed for retention.  Gout-continue to hold allopurinol  due to AKI.  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 Diet, exercise and weight reduction advised.      Out of bed to chair. Incentive spirometry. Nursing supportive care. Fall, aspiration precautions. Diet:  Diet Orders (From admission, onward)     Start     Ordered   04/15/24 0001  Diet NPO time specified Except for: Sips with Meds  Diet effective midnight       Comments: Patient to remain NPO until fully awake following the TEE.  Question:  Except for  Answer:  Noralyn with Meds   04/14/24 1337   04/10/24 2223  Diet clear liquid Room service appropriate? Yes; Fluid consistency: Thin  Diet effective now       Question Answer Comment  Room service appropriate? Yes   Fluid consistency: Thin      04/10/24 2225           DVT prophylaxis:   Level of care: Telemetry Medical   Code Status: Limited: Do not attempt resuscitation (DNR) -DNR-LIMITED -Do Not Intubate/DNI   Subjective: Patient is seen and examined today morning.  He is lying in bed, vascular technician at bedside doing DVT study.  Blood  pressure stable.  Eating poor.  Did not get out of bed.  Physical Exam: Vitals:   04/14/24 0026 04/14/24 0400 04/14/24  0751 04/14/24 1115  BP: (!) 118/59 (!) 115/54 (!) 110/59 115/79  Pulse: 89 86 90 84  Resp: 16 16 16 16   Temp: 98.5 F (36.9 C) 98 F (36.7 C) 97.6 F (36.4 C) 98.3 F (36.8 C)  TempSrc:      SpO2: 98% 98% 98% 100%  Weight:  116.3 kg    Height:        General - Elderly obese weak Caucasian male, no apparent distress HEENT - PERRLA, EOMI, atraumatic head, non tender sinuses. Lung - Clear, diffuse rales, rhonchi, no wheezes. Heart - S1, S2 heard, no murmurs, rubs, 1+ pitting pedal edema. Abdomen - Soft, non tender, obese, bowel sounds good Neuro - Alert, awake and oriented x 3, non focal exam. Skin - Warm and dry.  Chronic skin changes bilateral lower extremities  Data Reviewed:      Latest Ref Rng & Units 04/13/2024    2:02 AM 04/12/2024    2:44 AM 04/11/2024    7:25 AM  CBC  WBC 4.0 - 10.5 K/uL 7.3  9.8  10.2   Hemoglobin 13.0 - 17.0 g/dL 9.6  89.5  88.2   Hematocrit 39.0 - 52.0 % 27.3  29.9  33.3   Platelets 150 - 400 K/uL 105  134  148       Latest Ref Rng & Units 04/14/2024    4:52 AM 04/13/2024    2:02 AM 04/12/2024    2:44 AM  BMP  Glucose 70 - 99 mg/dL 886  874  856   BUN 8 - 23 mg/dL 12  26  45   Creatinine 0.61 - 1.24 mg/dL 9.15  8.98  8.45   Sodium 135 - 145 mmol/L 138  135  134   Potassium 3.5 - 5.1 mmol/L 3.4  3.3  4.0   Chloride 98 - 111 mmol/L 103  100  95   CO2 22 - 32 mmol/L 25  25  29    Calcium  8.9 - 10.3 mg/dL 8.7  8.5  8.7    VAS US  LOWER EXTREMITY VENOUS (DVT) Result Date: 04/14/2024  Lower Venous DVT Study Patient Name:  Tim [name]  Date of Exam:   04/14/2024 Medical Rec #: [id number]        Accession #:    [id number] Date of Birth: Apr 06, 1941        Patient Gender: M Patient Age:   61 years Exam Location:  [facility] Procedure:      VAS US  LOWER EXTREMITY VENOUS (DVT) Referring Phys: TAMELA STAKES --------------------------------------------------------------------------------  Indications: Swelling, and Edema.  Comparison Study:  Previous study of bilateral lower extremities on 1.6.2023. Performing Technologist: Edilia Elden Appl  Examination Guidelines: A complete evaluation includes B-mode imaging, spectral Doppler, color Doppler, and power Doppler as needed of all accessible portions of each vessel. Bilateral testing is considered an integral part of a complete examination. Limited examinations for reoccurring indications may be performed as noted. The reflux portion of the exam is performed with the patient in reverse Trendelenburg.  +-----+---------------+---------+-----------+----------+--------------+ RIGHTCompressibilityPhasicitySpontaneityPropertiesThrombus Aging +-----+---------------+---------+-----------+----------+--------------+ CFV  Full           Yes      Yes                                 +-----+---------------+---------+-----------+----------+--------------+  SFJ  Full           Yes      Yes                                 +-----+---------------+---------+-----------+----------+--------------+   +---------+---------------+---------+-----------+----------+-------------------+ LEFT     CompressibilityPhasicitySpontaneityPropertiesThrombus Aging      +---------+---------------+---------+-----------+----------+-------------------+ CFV      Full           Yes      Yes                                      +---------+---------------+---------+-----------+----------+-------------------+ SFJ      Full           Yes      Yes                                      +---------+---------------+---------+-----------+----------+-------------------+ FV Prox  Full                                                             +---------+---------------+---------+-----------+----------+-------------------+ FV Mid   Full                                                             +---------+---------------+---------+-----------+----------+-------------------+ FV DistalFull                                                              +---------+---------------+---------+-----------+----------+-------------------+ PFV      Full                                                             +---------+---------------+---------+-----------+----------+-------------------+ POP      Full           Yes      Yes                                      +---------+---------------+---------+-----------+----------+-------------------+ PTV                                                   Not well visualized +---------+---------------+---------+-----------+----------+-------------------+ PERO  Not well visualized +---------+---------------+---------+-----------+----------+-------------------+ Fluid collection noted in the left popliteal fossa measuring 4.2 x 0.7 x x1.5 cm. Left posterior tibial veins and peroneal veins were not visualized due to wrap around bandages.    Summary: RIGHT: - No evidence of common femoral vein obstruction.   LEFT: - There is no evidence of deep vein thrombosis in the lower extremity.  - A cystic structure is found in the popliteal fossa.  *See table(s) above for measurements and observations. Electronically signed by Debby Robertson on 04/14/2024 at 10:47:34 AM.    Final     Family Communication: Discussed with patient, understand and agree. All questions answered.  Disposition: Status is: Inpatient Remains inpatient appropriate because: TEE planned for tomorrow, PT OT evaluation, IV antibiotics  Planned Discharge Destination: Home with Home Health and Rehab     Time spent: 46 minutes  Author: Concepcion Riser, MD 04/14/2024 3:57 PM Secure chat 7am to 7pm For on call review [email or web address].

## 2024-04-15 ENCOUNTER — Inpatient Hospital Stay (HOSPITAL_COMMUNITY)

## 2024-04-15 ENCOUNTER — Encounter (HOSPITAL_COMMUNITY): Admission: EM | Disposition: A | Discharge status: Home Health | Payer: Self-pay | Source: Home / Self Care

## 2024-04-15 DIAGNOSIS — T8209XD Other mechanical complication of heart valve prosthesis, subsequent encounter: Secondary | ICD-10-CM

## 2024-04-15 DIAGNOSIS — R579 Shock, unspecified: Secondary | ICD-10-CM | POA: Diagnosis not present

## 2024-04-15 DIAGNOSIS — I340 Nonrheumatic mitral (valve) insufficiency: Secondary | ICD-10-CM

## 2024-04-15 DIAGNOSIS — I330 Acute and subacute infective endocarditis: Secondary | ICD-10-CM | POA: Diagnosis not present

## 2024-04-15 DIAGNOSIS — I3481 Nonrheumatic mitral (valve) annulus calcification: Secondary | ICD-10-CM | POA: Diagnosis not present

## 2024-04-15 DIAGNOSIS — I350 Nonrheumatic aortic (valve) stenosis: Secondary | ICD-10-CM | POA: Diagnosis not present

## 2024-04-15 DIAGNOSIS — T82857D Stenosis of cardiac prosthetic devices, implants and grafts, subsequent encounter: Secondary | ICD-10-CM | POA: Diagnosis not present

## 2024-04-15 DIAGNOSIS — I10 Essential (primary) hypertension: Secondary | ICD-10-CM | POA: Diagnosis not present

## 2024-04-15 DIAGNOSIS — Z8679 Personal history of other diseases of the circulatory system: Secondary | ICD-10-CM

## 2024-04-15 DIAGNOSIS — I2510 Atherosclerotic heart disease of native coronary artery without angina pectoris: Secondary | ICD-10-CM | POA: Diagnosis not present

## 2024-04-15 DIAGNOSIS — I480 Paroxysmal atrial fibrillation: Secondary | ICD-10-CM

## 2024-04-15 DIAGNOSIS — N179 Acute kidney failure, unspecified: Secondary | ICD-10-CM | POA: Diagnosis not present

## 2024-04-15 DIAGNOSIS — R410 Disorientation, unspecified: Secondary | ICD-10-CM | POA: Diagnosis not present

## 2024-04-15 HISTORY — PX: TRANSESOPHAGEAL ECHOCARDIOGRAM (CATH LAB): EP1270

## 2024-04-15 LAB — ECHO TEE
AR max vel: 0.74 cm2
AV Area VTI: 0.55 cm2
AV Area mean vel: 0.77 cm2
AV Mean grad: 40 mmHg
AV Peak grad: 68.2 mmHg
Ao pk vel: 4.13 m/s

## 2024-04-15 LAB — TROPONIN I (HIGH SENSITIVITY): Troponin I (High Sensitivity): 41 ng/L — ABNORMAL HIGH (ref <18)

## 2024-04-15 LAB — CBC
HCT: 25.6 % — ABNORMAL LOW (ref 39.0–52.0)
Hemoglobin: 8.9 g/dL — ABNORMAL LOW (ref 13.0–17.0)
MCH: 33.1 pg (ref 26.0–34.0)
MCHC: 34.8 g/dL (ref 30.0–36.0)
MCV: 95.2 fL (ref 80.0–100.0)
Platelets: 76 K/uL — ABNORMAL LOW (ref 150–400)
RBC: 2.69 MIL/uL — ABNORMAL LOW (ref 4.22–5.81)
RDW: 14.1 % (ref 11.5–15.5)
WBC: 5.9 K/uL (ref 4.0–10.5)
nRBC: 0 % (ref 0.0–0.2)

## 2024-04-15 LAB — BASIC METABOLIC PANEL WITH GFR
Anion gap: 10 (ref 5–15)
BUN: 5 mg/dL — ABNORMAL LOW (ref 8–23)
CO2: 24 mmol/L (ref 22–32)
Calcium: 8.4 mg/dL — ABNORMAL LOW (ref 8.9–10.3)
Chloride: 103 mmol/L (ref 98–111)
Creatinine, Ser: 0.68 mg/dL (ref 0.61–1.24)
GFR, Estimated: >60 mL/min (ref >60)
Glucose, Bld: 111 mg/dL — ABNORMAL HIGH (ref 70–99)
Potassium: 3.6 mmol/L (ref 3.5–5.1)
Sodium: 137 mmol/L (ref 135–145)

## 2024-04-15 LAB — GLUCOSE, CAPILLARY
Glucose-Capillary: 123 mg/dL — ABNORMAL HIGH (ref 70–99)
Glucose-Capillary: 102 mg/dL — ABNORMAL HIGH (ref 70–99)
Glucose-Capillary: 130 mg/dL — ABNORMAL HIGH (ref 70–99)
Glucose-Capillary: 103 mg/dL — ABNORMAL HIGH (ref 70–99)

## 2024-04-15 LAB — CULTURE, BLOOD (ROUTINE X 2)
Culture: NO GROWTH
Special Requests: ADEQUATE

## 2024-04-15 SURGERY — TRANSESOPHAGEAL ECHOCARDIOGRAM (TEE) (CATHLAB)
Anesthesia: Monitor Anesthesia Care

## 2024-04-15 MED ORDER — NITROGLYCERIN 2 % TD OINT
0.5000 [in_us] | TOPICAL_OINTMENT | Freq: Four times a day (QID) | TRANSDERMAL | Status: DC | PRN
  Administered 2024-04-15 – 2024-04-18 (×3): 0.5 [in_us] via TOPICAL
  Filled 2024-04-15: qty 30

## 2024-04-15 MED ORDER — LIDOCAINE 2% (20 MG/ML) 5 ML SYRINGE
INTRAMUSCULAR | Status: DC | PRN
  Administered 2024-04-15: 60 mg via INTRAVENOUS

## 2024-04-15 MED ORDER — PROPOFOL 10 MG/ML IV BOLUS
INTRAVENOUS | Status: DC | PRN
  Administered 2024-04-15 (×2): 20 mg via INTRAVENOUS
  Administered 2024-04-15: 100 mg via INTRAVENOUS
  Administered 2024-04-15 (×4): 20 mg via INTRAVENOUS

## 2024-04-15 MED ORDER — SODIUM CHLORIDE 0.9 % IV SOLN: INTRAVENOUS | Status: DC

## 2024-04-15 MED ORDER — GLYCOPYRROLATE 0.2 MG/ML IJ SOLN
INTRAMUSCULAR | Status: DC | PRN
Start: 2024-04-15 — End: 2024-04-15
  Administered 2024-04-15: .2 mg via INTRAVENOUS

## 2024-04-15 NOTE — Progress Notes (Signed)
 Progress Note  Patient Name: Tim [name] Date of Encounter: 04/15/2024  Primary Cardiologist:   Redell Shallow, MD   Subjective   BP 106/45.  Cr 0.68.  Denies any chest pain or dyspnea.    Inpatient Medications    Scheduled Meds:  aspirin  EC  81 mg Oral Daily   Chlorhexidine  Gluconate Cloth  6 each Topical Daily   feeding supplement  1 Container Oral TID BM   finasteride   5 mg Oral Daily   gabapentin   300 mg Oral TID   insulin  aspart  0-5 Units Subcutaneous QHS   insulin  aspart  0-9 Units Subcutaneous TID WC   lidocaine   1 patch Transdermal Q24H   melatonin  3 mg Oral QHS   nystatin    Topical BID   Continuous Infusions:  sodium chloride  20 mL/hr at 04/15/24 0746   cefTRIAXone  (ROCEPHIN )  IV 2 g (04/14/24 1051)   linezolid  (ZYVOX ) IV 600 mg (04/15/24 0747)   PRN Meds: acetaminophen , docusate sodium , fentaNYL  (SUBLIMAZE ) injection, nitroGLYCERIN , mouth rinse, oxyCODONE , polyethylene glycol   Vital Signs    Vitals:   04/14/24 2332 04/15/24 0238 04/15/24 0409 04/15/24 0837  BP: 114/66 (!) 121/51  (!) 106/45  Pulse: 86 90  81  Resp: 18 18  18   Temp: 97.6 F (36.4 C) 98.2 F (36.8 C)  98.2 F (36.8 C)  TempSrc: Oral Oral    SpO2: 100% 99%  99%  Weight:   117.7 kg   Height:        Intake/Output Summary (Last 24 hours) at 04/15/2024 0953 Last data filed at 04/15/2024 0000 Gross per 24 hour  Intake 800 ml  Output 1500 ml  Net -700 ml   Filed Weights   04/13/24 0500 04/14/24 0400 04/15/24 0409  Weight: 114.1 kg 116.3 kg 117.7 kg    Telemetry    V paced- Personally Reviewed  ECG    NA - Personally Reviewed  Physical Exam   GEN: No acute distress.   Neck: No  JVD Cardiac: RRR, 3/6 apical systolic murmur, no diastolic murmurs, rubs, or gallops.  Respiratory:     Decreased breath sounds at the bases.  GI: Soft, nontender, non-distended  MS:    Diffuse upper extremity edema, mild lower edema; No deformity. Neuro:  Nonfocal  Psych: Normal  affect   Labs    Chemistry Recent Labs  Lab 04/10/24 0548 04/10/24 0601 04/13/24 0202 04/14/24 0452 04/15/24 0330  NA 130*   < > 135 138 137  K 4.9   < > 3.3* 3.4* 3.6  CL 91*   < > 100 103 103  CO2 25   < > 25 25 24   GLUCOSE 144*   < > 125* 113* 111*  BUN 83*   < > 26* 12 5*  CREATININE 3.12*   < > 1.01 0.84 0.68  CALCIUM  9.0   < > 8.5* 8.7* 8.4*  PROT 6.5  --   --   --   --   ALBUMIN 3.4*  --   --   --   --   AST 75*  --   --   --   --   ALT 36  --   --   --   --   ALKPHOS 90  --   --   --   --   BILITOT 0.9  --   --   --   --   GFRNONAA 19*   < > >60 >60 >60  ANIONGAP 14   < > 10 10 10    < > = values in this interval not displayed.     Hematology Recent Labs  Lab 04/12/24 0244 04/13/24 0202 04/15/24 0330  WBC 9.8 7.3 5.9  RBC 3.15* 2.88* 2.69*  HGB 10.4* 9.6* 8.9*  HCT 29.9* 27.3* 25.6*  MCV 94.9 94.8 95.2  MCH 33.0 33.3 33.1  MCHC 34.8 35.2 34.8  RDW 14.2 13.9 14.1  PLT 134* 105* 76*    Cardiac EnzymesNo results for input(s): TROPONINI in the last 168 hours. No results for input(s): TROPIPOC in the last 168 hours.   BNP Recent Labs  Lab 04/10/24 0548  BNP 60.6     DDimer No results for input(s): DDIMER in the last 168 hours.   Radiology    VAS US  LOWER EXTREMITY VENOUS (DVT) Result Date: 04/14/2024  Lower Venous DVT Study Patient Name:  Tim [name]  Date of Exam:   04/14/2024 Medical Rec #: [id number]        Accession #:    [id number] Date of Birth: 1940/11/21        Patient Gender: M Patient Age:   83 years Exam Location:  [facility] Procedure:      VAS US  LOWER EXTREMITY VENOUS (DVT) Referring Phys: [facility] --------------------------------------------------------------------------------  Indications: Swelling, and Edema.  Comparison Study: Previous study of bilateral lower extremities on 1.6.2023. Performing Technologist: Edilia Elden Appl  Examination Guidelines: A complete evaluation includes B-mode imaging, spectral  Doppler, color Doppler, and power Doppler as needed of all accessible portions of each vessel. Bilateral testing is considered an integral part of a complete examination. Limited examinations for reoccurring indications may be performed as noted. The reflux portion of the exam is performed with the patient in reverse Trendelenburg.  +-----+---------------+---------+-----------+----------+--------------+ RIGHTCompressibilityPhasicitySpontaneityPropertiesThrombus Aging +-----+---------------+---------+-----------+----------+--------------+ CFV  Full           Yes      Yes                                 +-----+---------------+---------+-----------+----------+--------------+ SFJ  Full           Yes      Yes                                 +-----+---------------+---------+-----------+----------+--------------+   +---------+---------------+---------+-----------+----------+-------------------+ LEFT     CompressibilityPhasicitySpontaneityPropertiesThrombus Aging      +---------+---------------+---------+-----------+----------+-------------------+ CFV      Full           Yes      Yes                                      +---------+---------------+---------+-----------+----------+-------------------+ SFJ      Full           Yes      Yes                                      +---------+---------------+---------+-----------+----------+-------------------+ FV Prox  Full                                                             +---------+---------------+---------+-----------+----------+-------------------+  FV Mid   Full                                                             +---------+---------------+---------+-----------+----------+-------------------+ FV DistalFull                                                             +---------+---------------+---------+-----------+----------+-------------------+ PFV      Full                                                              +---------+---------------+---------+-----------+----------+-------------------+ POP      Full           Yes      Yes                                      +---------+---------------+---------+-----------+----------+-------------------+ PTV                                                   Not well visualized +---------+---------------+---------+-----------+----------+-------------------+ PERO                                                  Not well visualized +---------+---------------+---------+-----------+----------+-------------------+ Fluid collection noted in the left popliteal fossa measuring 4.2 x 0.7 x x1.5 cm. Left posterior tibial veins and peroneal veins were not visualized due to wrap around bandages.    Summary: RIGHT: - No evidence of common femoral vein obstruction.   LEFT: - There is no evidence of deep vein thrombosis in the lower extremity.  - A cystic structure is found in the popliteal fossa.  *See table(s) above for measurements and observations. Electronically signed by Debby Robertson on 04/14/2024 at 10:47:34 AM.    Final      Cardiac Studies   See above  Patient Profile     83 y.o. 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  Cardiology consulted for severe prosthetic valve stenosis and possible mitral valve vegetation.    Assessment & Plan    Shock:  Resolved. Thought to be related to hypovolemia and responsive to volume.  Blood cultures negative to date.  Holding home blood pressure medications.  He is now normotensive off Levophed.     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  Echocardiogram this  admission shows EF 65 to 70%, normal RV function, possible vegetation versus calcium  on anterior mitral valve leaflet, severe bioprosthetic aortic valve stenosis with mean gradient 46 mmHg, V-max 4.6 m/s, EOA 0.8 cm. -TEE planned for today for further evaluation.  Previously not felt to be surgical candidate, may be candidate for valve-in-valve TAVR for severe bioprosthetic stenosis   Mitral valve lesion: This is thought to be calcification rather than active vegetation.  To further clarify with TEE.   AKI: Creatinine continues to improve with hydration, has resolved   CAD: No current acute coronary symptoms.  Admitted at [facility] 03/2024 with NSTEMI, showed 95% ostial diagonal stenosis, small vessel and medical management recommended.  Plan continued medical management.  Discontinued aspirin  as he is on Eliquis  chronically, would restart while eliquis  on hold due to history of coronary stenting   PAF: No recurrent paroxysms.  On Eliquis , currently on hold given hematuria, which is resolving   Complete heart block: Status post permanent pacemaker with normal function.  ICD interrogation 04/02/2024 showed battery at 4 months.   For questions or updates, please contact [facility] Please consult [email or web address] for contact info under Cardiology/STEMI.   Signed, Lonni LITTIE Nanas, MD  04/15/2024, 9:53 AM

## 2024-04-15 NOTE — Evaluation (Signed)
 Physical Therapy Evaluation Patient Details Name: Tim [name] MRN: [id number] DOB: [birth year] Today's Date: 04/15/2024  History of Present Illness  Pt is an 83 y/o male presenting with AMS and hypotension requiring ICU admission and pressor in setting of shock. Pending TEE 10/14. PMH: TAVR with hx of endocarditis, mitral valve lesion, AKI, CAD, PAF, CAD s/p PCI, complete heart block s/p PPM, HTN, a fib, DM, OSA  Clinical Impression  Patient presents with decreased mobility due to weakness, imbalance, limited activity tolerance and high risk for falls.  Previously at home walking about 72' 4 times a day with walker, transitions to [facility] from recliner on his own and had 3 falls earlier this year.  Able to sit up to EOB with max A and stood with mod A from elevated surface though super fearful his legs would give out so did not walk far.  Attempted orthostatic testing though pt could not stand too long and cuff not taking first try.  Wife present and supportive, though pt may need post-acute inpatient rehab (<3 hours/day) prior to home depending on progress.  Their preference is for home.  PT will continue to follow.         If plan is discharge home, recommend the following: A lot of help with bathing/dressing/bathroom;A lot of help with walking and/or transfers;Assist for transportation;Assistance with cooking/housework   Can travel by private vehicle   No    Equipment Recommendations None recommended by PT  Recommendations for Other Services       Functional Status Assessment Patient has had a recent decline in their functional status and demonstrates the ability to make significant improvements in function in a reasonable and predictable amount of time.     Precautions / Restrictions Precautions Precautions: Fall Recall of Precautions/Restrictions: Intact Precaution/Restrictions Comments: watch BP      Mobility  Bed Mobility   Bed Mobility: Rolling, Sidelying to Sit, Sit to  Sidelying Rolling: Used rails, Mod assist Sidelying to sit: Used rails, Max assist     Sit to sidelying: Max assist General bed mobility comments: rolled due to back issues and pain under bottom; assist for trunk and legs to sit and to supine, RN in to assist with repositioning up in bed end of session    Transfers Overall transfer level: Needs assistance Equipment used: Rolling walker (2 wheels) Transfers: Sit to/from Stand Sit to Stand: From elevated surface, Mod assist           General transfer comment: lifting assist to stand with cues for hand placement; seems extremely fearful of legs buckling and weak and with difficulty maintaining head upright even after standing so checked BP    Ambulation/Gait Ambulation/Gait assistance: Min assist, Mod assist Gait Distance (Feet): 3 Feet Assistive device: Rolling walker (2 wheels) Gait Pattern/deviations: Step-to pattern, Decreased stride length, Shuffle, Wide base of support, Trunk flexed       General Gait Details: steps forward then backward with A For balance and walker proximity esp backing up  Stairs            Wheelchair Mobility     Tilt Bed    Modified Rankin (Stroke Patients Only)       Balance Overall balance assessment: Needs assistance Sitting-balance support: Feet supported Sitting balance-Leahy Scale: Poor Sitting balance - Comments: weakness, so CGA while seated at EOB   Standing balance support: Bilateral upper extremity supported Standing balance-Leahy Scale: Poor Standing balance comment: UE support and min A for balance  Pertinent Vitals/Pain Pain Assessment Pain Assessment: Faces Faces Pain Scale: Hurts even more Pain Location: bottom, back, scrotum Pain Descriptors / Indicators: Grimacing, Guarding, Sore Pain Intervention(s): Monitored during session, Limited activity within patient's tolerance, Repositioned    Home Living Family/patient  expects to be discharged to:: Private residence Living Arrangements: Spouse/significant other Available Help at Discharge: Family Type of Home: House Home Access: Stairs to enter Entrance Stairs-Rails: Doctor, general practice of Steps: 4 (has stair lift)   Home Layout: One level Home Equipment: Agricultural consultant (2 wheels);Rollator (4 wheels);BSC/3in1;Wheelchair - manual      Prior Function Prior Level of Function : Needs assist             Mobility Comments: use of RW to transfer to/from [facility]. able to take some steps towards garage steps with chair lift, toward kitchen but increasingly difficult. sleeps in recliner; 3 falls in past 6 months ADLs Comments: Able to perform UB ADLs. Wife assists with sponge bathing, LB Dressing and emptying BSC. 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, wrap on lower leg; AROM WFL, strength hip flexion 4-/5, knee extension 4/5, ankle DF4+/5 LLE Sensation: decreased light touch;history of peripheral neuropathy (sensation absent to mid shin)    Cervical / Trunk Assessment Cervical / Trunk Assessment: Kyphotic;Other exceptions Cervical / Trunk Exceptions: chronic back pain (likey stenosis) walks flexed  Communication   Communication Communication: Impaired Factors Affecting Communication: Hearing impaired    Cognition Arousal: Alert Behavior During Therapy: Flat affect                             Following commands: Intact       Cueing Cueing Techniques: Verbal cues,  Gestural cues, Tactile cues     General Comments General comments (skin integrity, edema, etc.): wife at the bedside; BP seated 124/69 HR 84; after standing (attempted to take standing though pt could not maintain and BP didn't take first time) 118/72 HR 93    Exercises     Assessment/Plan    PT Assessment Patient needs continued PT services  PT Problem List Decreased strength;Decreased activity tolerance;Decreased balance;Decreased mobility;Decreased knowledge of use of DME;Pain       PT Treatment Interventions DME instruction;Gait training;Functional mobility training;Therapeutic activities;Therapeutic exercise;Balance training;Patient/family education    PT Goals (Current goals can be found in the Care Plan section)  Acute Rehab PT Goals Patient Stated Goal: return home PT Goal Formulation: With patient/family Time For Goal Achievement: 04/29/24 Potential to Achieve Goals: Fair    Frequency Min 2X/week     Co-evaluation               AM-PAC PT 6 Clicks Mobility  Outcome Measure Help needed turning from your back to your side while in a flat bed without using bedrails?: Total Help needed moving from lying on your back to sitting on the side of a flat bed without using bedrails?: Total Help needed moving to and from a bed to a chair (including a wheelchair)?: A Lot Help needed standing up from a chair using your arms (e.g.,  wheelchair or bedside chair)?: A Lot Help needed to walk in hospital room?: Total Help needed climbing 3-5 steps with a railing? : Total 6 Click Score: 8    End of Session Equipment Utilized During Treatment: Gait belt Activity Tolerance: Patient limited by fatigue Patient left: in bed;with family/visitor present;Other (comment) (transporter arrived to take for TEE)   PT Visit Diagnosis: Other abnormalities of gait and mobility (R26.89);Muscle weakness (generalized) (M62.81);Difficulty in walking, not elsewhere classified (R26.2);History of  falling (Z91.81)    Time: 8994-8951 PT Time Calculation (min) (ACUTE ONLY): 43 min   Charges:   PT Evaluation $PT Eval Moderate Complexity: 1 Mod PT Treatments $Therapeutic Activity: 23-37 mins PT General Charges $$ ACUTE PT VISIT: 1 Visit         Tim [name], PT Acute Rehabilitation Services Office:[phone] 04/15/2024   Tim [name] 04/15/2024, 1:14 PM

## 2024-04-15 NOTE — Anesthesia Preprocedure Evaluation (Addendum)
 Anesthesia Evaluation  Patient identified by MRN, date of birth, ID band Patient awake    Reviewed: Allergy & Precautions, NPO status , Patient's Chart, lab work & pertinent test results, reviewed documented beta blocker date and time   History of Anesthesia Complications Negative for: history of anesthetic complications  Airway Mallampati: II  TM Distance: >3 FB Neck ROM: Full    Dental no notable dental hx.    Pulmonary asthma , sleep apnea and Continuous Positive Airway Pressure Ventilation , COPD,  COPD inhaler   Pulmonary exam normal        Cardiovascular hypertension, Pt. on medications and Pt. on home beta blockers + CAD, + Past MI, + Cardiac Stents (2017) and +CHF  Normal cardiovascular exam+ dysrhythmias Atrial Fibrillation + Cardiac Defibrillator   S/P TAVR 2017  TEE 09/2019: EF 60-65%, mild MR, TAVR gradient mildly elevated, small PFO with predominantly left to right shunting across the atrial septum   Neuro/Psych negative neurological ROS  negative psych ROS   GI/Hepatic Neg liver ROS, hiatal hernia, PUD,GERD  Medicated and Controlled,,  Endo/Other  diabetes, Type 2  Class 3 obesity  Renal/GU negative Renal ROS  negative genitourinary   Musculoskeletal  (+) Arthritis ,    Abdominal  (+) + obese  Peds  Hematology  (+) Blood dyscrasia, anemia Hgb 10.0   Anesthesia Other Findings Bacteremia  Reproductive/Obstetrics negative OB ROS                              Anesthesia Physical Anesthesia Plan  ASA: III  Anesthesia Plan: MAC   Post-op Pain Management:    Induction:   PONV Risk Score and Plan: 1 and Treatment may vary due to age or medical condition and Propofol  infusion  Airway Management Planned: Natural Airway and Nasal Cannula  Additional Equipment:   Intra-op Plan:   Post-operative Plan:   Informed Consent: I have reviewed the patients History and Physical,  chart, labs and discussed the procedure including the risks, benefits and alternatives for the proposed anesthesia with the patient or authorized representative who has indicated his/her understanding and acceptance.   Patient has DNR.  Discussed DNR with patient.     Plan Discussed with: CRNA  Anesthesia Plan Comments: (Discussed DNR with patient in preop. Patient would accept intubation if required periprocedurally. Patient does not wish to have defibrillation performed in event of malignant arrhythmia. Lawence, MD)         Anesthesia Quick Evaluation

## 2024-04-15 NOTE — Interval H&P Note (Signed)
 History and Physical Interval Note:  04/15/2024 11:15 AM  Tim [name]  has presented today for surgery, with the diagnosis of Bacteremia, veg seen on TTE.  The various methods of treatment have been discussed with the patient and family. After consideration of risks, benefits and other options for treatment, the patient has consented to  Procedure(s): TRANSESOPHAGEAL ECHOCARDIOGRAM ([name]) as a surgical intervention.  The patient's history has been reviewed, patient examined, no change in status, stable for surgery.  I have reviewed the patient's chart and labs.  Questions were answered to the patient's satisfaction.     [name]

## 2024-04-15 NOTE — Evaluation (Signed)
 Occupational Therapy Evaluation Patient Details Name: Tim [name] MRN: [id number] DOB: 10/30/1940 Today's Date: 04/15/2024   History of Present Illness   Pt is an 83 y/o male presenting with AMS and hypotension requiring ICU admission and pressor in setting of shock. Pending TEE 10/14. PMH: TAVR with hx of endocarditis, mitral valve lesion, AKI, CAD, PAF, CAD s/p PCI, complete heart block s/p PPM, HTN, a fib, DM, OSA     Clinical Impressions PTA, pt lives with spouse, typically able to complete transfers/mobilize a limited distance with RW. Wife assists with LB ADLs and IADLs. Pt presents now with deficits in strength, endurance and balance. Pt also limited by chronic back pain and acute bottom/scrotal discomfort. Pt requires Max A for bed mobility, up to Mod A for brief standing at bedside with RW before fatiguing. Pt requires up to Mod A for UB ADL and Max A for LB ADLs d/t deficits. Wife at bedside, hopeful for pt to be able to transfer w/o assist in order to DC home safely. Recommend postacute rehab stay based on current presentation though noted pt/family hopeful to progress to DC home.     If plan is discharge home, recommend the following:   A lot of help with walking and/or transfers;A lot of help with bathing/dressing/bathroom     Functional Status Assessment   Patient has had a recent decline in their functional status and demonstrates the ability to make significant improvements in function in a reasonable and predictable amount of time.     Equipment Recommendations   None recommended by OT     Recommendations for Other Services         Precautions/Restrictions   Precautions Precautions: Fall Restrictions Weight Bearing Restrictions Per Provider Order: No     Mobility Bed Mobility Overal bed mobility: Needs Assistance Bed Mobility: Supine to Sit     Supine to sit: HOB elevated, Used rails, Max assist     General bed mobility comments: HOB  elevated significantly to simulate recliner at home. assist for BLE to EOB, assist to scoot hips and lift trunk. cued for bedrail use    Transfers Overall transfer level: Needs assistance Equipment used: Rolling walker (2 wheels) Transfers: Sit to/from Stand Sit to Stand: Mod assist, From elevated surface           General transfer comment: Light Mod A to stand from elevated bed with BUE on RW. significantly forward flexed.able to stand < 30 seconds. difficulty lifting feet to step along bedside      Balance                                           ADL either performed or assessed with clinical judgement   ADL Overall ADL's : Needs assistance/impaired Eating/Feeding: Set up   Grooming: Set up;Sitting;Bed level   Upper Body Bathing: Moderate assistance;Sitting   Lower Body Bathing: Maximal assistance;Sitting/lateral leans;Sit to/from stand   Upper Body Dressing : Moderate assistance;Sitting   Lower Body Dressing: Maximal assistance;Sit to/from stand;Sitting/lateral leans   Toilet Transfer: Moderate assistance;Stand-pivot;Rolling walker (2 wheels);BSC/3in1   Toileting- Clothing Manipulation and Hygiene: Maximal assistance;Sitting/lateral lean;Sit to/from stand Toileting - Clothing Manipulation Details (indicate cue type and reason): assist for posterior hygiene in standing as smear noted on pad             Vision Ability to See in Adequate Light: 0  Adequate Patient Visual Report: No change from baseline Vision Assessment?: No apparent visual deficits     Perception         Praxis         Pertinent Vitals/Pain Pain Assessment Pain Assessment: Faces Faces Pain Scale: Hurts even more Pain Location: bottom, back, scrotum Pain Descriptors / Indicators: Grimacing, Guarding, Sore Pain Intervention(s): Monitored during session, Limited activity within patient's tolerance     Extremity/Trunk Assessment Upper Extremity Assessment Upper  Extremity Assessment: Right hand dominant;RUE deficits/detail;LUE deficits/detail RUE Deficits / Details: edematous from wrist to upper UE LUE Deficits / Details: edematous from wrist to upper UE   Lower Extremity Assessment Lower Extremity Assessment: Defer to PT evaluation   Cervical / Trunk Assessment Cervical / Trunk Assessment: Kyphotic   Communication Communication Communication: Impaired Factors Affecting Communication: Hearing impaired   Cognition Arousal: Alert Behavior During Therapy: WFL for tasks assessed/performed Cognition: No apparent impairments                               Following commands: Intact       Cueing  General Comments   Cueing Techniques: Verbal cues;Gestural cues;Tactile cues  Wife at bedside. Lab in to draw blood cultures requiring increased time. Blood from LUE from blood draw upon standing- lab tech assisting to rewrap LUE site. Due to scrotal and bottom pain- discussed potential air mattress with RN   Exercises     Shoulder Instructions      Home Living Family/patient expects to be discharged to:: Private residence Living Arrangements: Spouse/significant other Available Help at Discharge: Family Type of Home: House Home Access: Stairs to enter Secretary/administrator of Steps: 4 Entrance Stairs-Rails: Right;Left (chair lift on steps in garage) Home Layout: One level     Bathroom Shower/Tub: Sponge bathes at baseline         Home Equipment: Agricultural consultant (2 wheels);Rollator (4 wheels);BSC/3in1;Wheelchair - manual          Prior Functioning/Environment Prior Level of Function : Needs assist             Mobility Comments: use of RW to transfer to/from [facility]. able to take some steps towards garage steps with chair lift, toward kitchen but increasingly difficult. sleeps in recliner ADLs Comments: Able to perform UB ADLs. Wife assists with sponge bathing, LB Dressing and emptying BSC. Wife manages IADLs    OT  Problem List: Decreased strength;Decreased activity tolerance;Impaired balance (sitting and/or standing);Pain;Cardiopulmonary status limiting activity;Decreased knowledge of use of DME or AE;Decreased knowledge of precautions   OT Treatment/Interventions: Self-care/ADL training;Therapeutic exercise;Energy conservation;DME and/or AE instruction;Therapeutic activities;Patient/family education;Balance training      OT Goals(Current goals can be found in the care plan section)   Acute Rehab OT Goals Patient Stated Goal: for pt to be able to transfer self and take a couple of steps OT Goal Formulation: With patient/family Time For Goal Achievement: 04/29/24 Potential to Achieve Goals: Good ADL Goals Pt Will Transfer to Toilet: with contact guard assist;stand pivot transfer;bedside commode Pt Will Perform Toileting - Clothing Manipulation and hygiene: with contact guard assist;sit to/from stand;sitting/lateral leans Pt/caregiver will Perform Home Exercise Program: Increased strength;Both right and left upper extremity;With theraband;Independently;With written HEP provided Additional ADL Goal #1: Pt to increase standing tolerance > 2 min during ADLs/mobility without need for seated rest break   OT Frequency:  Min 2X/week    Co-evaluation  AM-PAC OT 6 Clicks Daily Activity     Outcome Measure Help from another person eating meals?: A Little Help from another person taking care of personal grooming?: A Little Help from another person toileting, which includes using toliet, bedpan, or urinal?: A Lot Help from another person bathing (including washing, rinsing, drying)?: A Lot Help from another person to put on and taking off regular upper body clothing?: A Lot Help from another person to put on and taking off regular lower body clothing?: A Lot 6 Click Score: 14   End of Session Equipment Utilized During Treatment: Rolling walker (2 wheels) Nurse Communication: Mobility  status  Activity Tolerance: Patient limited by fatigue Patient left: in bed;with call bell/phone within reach;with nursing/sitter in room;with family/visitor present  OT Visit Diagnosis: Unsteadiness on feet (R26.81);Other abnormalities of gait and mobility (R26.89);Muscle weakness (generalized) (M62.81)                Time: 9165-9078 OT Time Calculation (min): 47 min Charges:  OT General Charges $OT Visit: 1 Visit OT Evaluation $OT Eval Moderate Complexity: 1 Mod OT Treatments $Self Care/Home Management : 8-22 mins $Therapeutic Activity: 8-22 mins  Mliss NOVAK, OTR/L Acute Rehab Services Office: [phone]   Mliss Fish 04/15/2024, 10:18 AM

## 2024-04-15 NOTE — Anesthesia Postprocedure Evaluation (Signed)
 Anesthesia Post Note  Patient: Tim [name]  Procedure(s) Performed: TRANSESOPHAGEAL ECHOCARDIOGRAM     Patient location during evaluation: PACU Anesthesia Type: MAC Level of consciousness: awake and alert Pain management: pain level controlled Vital Signs Assessment: post-procedure vital signs reviewed and stable Respiratory status: spontaneous breathing, nonlabored ventilation and respiratory function stable Cardiovascular status: blood pressure returned to baseline and stable Postop Assessment: no apparent nausea or vomiting Anesthetic complications: no   No notable events documented.  Last Vitals:  Vitals:   04/15/24 1224 04/15/24 1254  BP: (!) 114/53 (!) 117/47  Pulse: 79 90  Resp: 17 18  Temp:  37 C  SpO2: 97% 97%    Last Pain:  Vitals:   04/15/24 1224  TempSrc:   PainSc: 0-No pain                 Butler Levander Pinal

## 2024-04-15 NOTE — Progress Notes (Signed)
 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   Mr. Walbert blood cultures remain without growth to date and TEE shows no active endocarditis with small mobile, calcified mass on the mitral valve likely a resolved vegetation. Tolerating linezolid  and ceftriaxone  with no adverse side effects and will continue therapeutic drug monitoring of plaetlet count. Discussed plan of care to continue with linezolid  and ceftriaxone . Will discuss potential need for endocarditis treatment versus restarting suppression. Continue standard/universal precautions. Remaining medical and supportive care per Internal Medicine.   PLAN:  Continue current dose of linezolid  and ceftriaxone . Therapeutic drug monitoring of platelet levels. Monitor cultures for bacteremia or new organisms. Standard/universal precautions.  Remaining medical and supportive care per Internal Medicine.  Principal Problem:   Shock (HCC) Active Problems:   PAF (paroxysmal atrial fibrillation) (HCC)   Hypotension   AKI (acute kidney injury)   Confusion   Uremia   Stenosis of prosthetic aortic valve    aspirin  EC  81 mg Oral Daily   Chlorhexidine  Gluconate Cloth  6 each Topical Daily   feeding supplement  1 Container Oral TID BM   finasteride   5 mg Oral Daily   gabapentin   300 mg Oral TID   insulin  aspart  0-5 Units Subcutaneous QHS   insulin  aspart  0-9 Units Subcutaneous TID WC   lidocaine   1 patch Transdermal Q24H   melatonin  3 mg Oral QHS   nystatin    Topical BID    SUBJECTIVE:  Afebrile overnight with no acute  events. Tolerating antibiotics with no adverse side effects. Wife at bedside. Awaiting TEE.   Allergies  Allergen Reactions   Brimonidine Tartrate Shortness Of Breath    Alphagan-Shortness of breath   Brinzolamide Shortness Of Breath    AZOPT- Shortness of breath   Latanoprost Shortness Of Breath    XALATAN- Shortness of breath   Nucynta  [Tapentadol ] Shortness Of Breath   Sulfa Antibiotics Palpitations   Timolol Maleate Shortness Of Breath and Other (See Comments)    TIMOPTIC- Aggravated asthma   Diltiazem Swelling     leg swelling   Rofecoxib Swelling     VIOXX- leg swelling   Vancomycin Rash and Other (See Comments)    Blisters   Codeine Other (See Comments)    Childhood reaction   Tamsulosin Other (See Comments)    Dizziness    Celecoxib Other (See Comments)    CELLBREX-confusion   Colchicine Diarrhea    diarrhea   Tape Rash     Review of Systems: Review of Systems  Constitutional:  Negative for chills, fever and weight loss.  Respiratory:  Negative for cough, shortness of breath and wheezing.   Cardiovascular:  Negative for chest pain and leg swelling.  Gastrointestinal:  Negative for abdominal pain, constipation, diarrhea, nausea and vomiting.  Skin:  Negative for rash.      OBJECTIVE: Vitals:   04/15/24 1053 04/15/24 1204 04/15/24 1214 04/15/24 1224  BP: 118/60 (!) 102/48 (!) 106/53 (!) 114/53  Pulse: 73 72 82 79  Resp: 14 18 17 17   Temp: 97.6 F (36.4 C) (!)  97.4 F (36.3 C)    TempSrc: Temporal Tympanic    SpO2: 99% 99% 96% 97%  Weight:      Height:       Body mass index is 31.59 kg/m.  Physical Exam Constitutional:      General: He is not in acute distress.    Appearance: He is well-developed.  Cardiovascular:     Rate and Rhythm: Normal rate and regular rhythm.     Heart sounds: Normal heart sounds.  Pulmonary:     Effort: Pulmonary effort is normal.     Breath sounds: Normal breath sounds.  Skin:    General: Skin is warm and dry.   Neurological:     Mental Status: He is alert and oriented to person, place, and time.     Lab Results Lab Results  Component Value Date   WBC 5.9 04/15/2024   HGB 8.9 (L) 04/15/2024   HCT 25.6 (L) 04/15/2024   MCV 95.2 04/15/2024   PLT 76 (L) 04/15/2024    Lab Results  Component Value Date   CREATININE 0.68 04/15/2024   BUN 5 (L) 04/15/2024   NA 137 04/15/2024   K 3.6 04/15/2024   CL 103 04/15/2024   CO2 24 04/15/2024    Lab Results  Component Value Date   ALT 36 04/10/2024   AST 75 (H) 04/10/2024   ALKPHOS 90 04/10/2024   BILITOT 0.9 04/10/2024     Microbiology: Recent Results (from the past 240 hours)  Blood culture (routine x 2)     Status: None (Preliminary result)   Collection Time: 04/10/24  5:48 AM   Specimen: BLOOD RIGHT ARM  Result Value Ref Range Status   Specimen Description BLOOD RIGHT ARM  Final   Special Requests   Final    BOTTLES DRAWN AEROBIC AND ANAEROBIC Blood Culture adequate volume   Culture  Setup Time   Final    GRAM POSITIVE RODS AEROBIC BOTTLE ONLY CRITICAL RESULT CALLED TO, READ BACK BY AND VERIFIED WITH: [facility] PHARMD 04/14/2024 @ 0457 BY AB    Culture   Final    CULTURE REINCUBATED FOR BETTER GROWTH Performed at [facility], [address] [place], [place] [address]    Report Status PENDING  Incomplete  Blood culture (routine x 2)     Status: None   Collection Time: 04/10/24  6:18 AM   Specimen: BLOOD  Result Value Ref Range Status   Specimen Description BLOOD SITE NOT SPECIFIED  Final   Special Requests   Final    BOTTLES DRAWN AEROBIC AND ANAEROBIC Blood Culture adequate volume   Culture   Final    NO GROWTH 5 DAYS Performed at [facility], [address], [place], [place] [address]    Report Status 04/15/2024 FINAL  Final  MRSA Next Gen by PCR, Nasal     Status: None   Collection Time: 04/10/24  9:42 AM   Specimen: Nasal Mucosa; Nasal Swab  Result Value Ref Range Status   MRSA by PCR Next Gen NOT  DETECTED NOT DETECTED Final    Comment: (NOTE) The GeneXpert MRSA Assay (FDA approved for NASAL specimens only), is one component of a comprehensive MRSA colonization surveillance program. It is not intended to diagnose MRSA infection nor to guide or monitor treatment for MRSA infections. Test performance is not FDA approved in patients less than 67 years old. Performed at [facility], [address] [place], [place] [address]  Resp panel by RT-PCR (RSV, Flu A&B, Covid) Nasal Mucosa     Status: None   Collection Time: 04/10/24  9:42 AM   Specimen: Nasal Mucosa; Nasal Swab  Result Value Ref Range Status   SARS Coronavirus 2 by RT PCR NEGATIVE NEGATIVE Final   Influenza A by PCR NEGATIVE NEGATIVE Final   Influenza B by PCR NEGATIVE NEGATIVE Final    Comment: (NOTE) The Xpert Xpress SARS-CoV-2/FLU/RSV plus assay is intended as an aid in the diagnosis of influenza from Nasopharyngeal swab specimens and should not be used as a sole basis for treatment. Nasal washings and aspirates are unacceptable for Xpert Xpress SARS-CoV-2/FLU/RSV testing.  Fact Sheet for Patients: BloggerCourse.com  Fact Sheet for Healthcare Providers: SeriousBroker.it  This test is not yet approved or cleared by the [place]  FDA and has been authorized for detection and/or diagnosis of SARS-CoV-2 by FDA under an Emergency Use Authorization (EUA). This EUA will remain in effect (meaning this test can be used) for the duration of the COVID-19 declaration under Section 564(b)(1) of the Act, 21 U.S.C. section 360bbb-3(b)(1), unless the authorization is terminated or revoked.     Resp Syncytial Virus by PCR NEGATIVE NEGATIVE Final    Comment: (NOTE) Fact Sheet for Patients: BloggerCourse.com  Fact Sheet for Healthcare Providers: SeriousBroker.it  This test is not yet approved or cleared by the  [place]  FDA and has been authorized for detection and/or diagnosis of SARS-CoV-2 by FDA under an Emergency Use Authorization (EUA). This EUA will remain in effect (meaning this test can be used) for the duration of the COVID-19 declaration under Section 564(b)(1) of the Act, 21 U.S.C. section 360bbb-3(b)(1), unless the authorization is terminated or revoked.  Performed at [facility], [address], [place], [place] [address]     I have personally spent 28 minutes involved in face-to-face and non-face-to-face activities for this patient on the day of the visit. Professional time spent includes the following activities: preparing to see the patient (review of tests), performing a medically appropriate examination, ordering medications, communicating with other health care professionals, documenting clinical information in the EMR, communicating results and counseling patient and family regarding medication and plan of care, and care coordination.   Greg Jaggar Benko, NP [facility]  04/15/2024  12:32 PM

## 2024-04-15 NOTE — Plan of Care (Signed)

## 2024-04-15 NOTE — Progress Notes (Addendum)
 Admission Nurse Note: At the bedside with the patient/wife for admission. States previously had [facility] in [place], happy with services. Patient/wife agreeable with SNF plan for inpatient rehab. Desires to discharge home w/ HH on when ready to transfer home after rehab. States he has had challenges with word finding for extended time with unknown cause. States also has residual challenges with taste impacting appetite after receiving IV abx on previous hospital stay.

## 2024-04-15 NOTE — TOC Progression Note (Signed)
 Transition of Care [facility]) - Progression Note    Patient Details  Name: Tim [name] MRN: [id number] Date of Birth: 1941/01/14  Transition [facility]) CM/SW Contact  Rosaline JONELLE Joe, RN Phone Number: 04/15/2024, 3:20 PM  Clinical Narrative:    Transition of Care [facility]) - Inpatient Brief Assessment   Patient Details  Name: Tim [name] MRN: [id number] Date of Birth: 1940-10-26  Transition of Care [facility]) CM/SW Contact:    Rosaline JONELLE Joe, RN Phone Number: 04/15/2024, 3:20 PM   Clinical Narrative: Patient admitted to the hospital for AMS, hypotension.  Patient lives at home with spouse.  Patient is recommended for SNF placement and patient was agreeable - patient states that he went to [facility] in the past and will not return there for rehabilitation services.  DME at the home includes RW, rolator, 3:1, WC, forearm crutches, old CPAP at home that is not working.  Patient states that he recently attempted CPAP sleep study at home but was unable to complete the study.  SNF work up started and MSW to follow up with the patient/wife tomorrow to provide bed offers.  Transition of Care Asessment: Insurance and Status: Insurance coverage has been reviewed Patient has primary care physician: Yes Home environment has been reviewed: from home Prior level of function:: self Prior/Current Home Services: No current home services Social Drivers of Health Review: SDOH reviewed no interventions necessary Readmission risk has been reviewed: Yes Transition of care needs: no transition of care needs at this time                      Expected Discharge Plan and Services                                               Social Drivers of Health (SDOH) Interventions SDOH Screenings   Food Insecurity: No Food Insecurity (04/14/2024)  Housing: Low Risk  (04/14/2024)  Transportation Needs: No Transportation Needs (04/14/2024)  Utilities: Not At  Risk (04/14/2024)  Depression (PHQ2-9): Low Risk  (03/31/2024)  Financial Resource Strain: Low Risk  (09/23/2023)  Physical Activity: Unknown (09/23/2023)  Social Connections: Moderately Isolated (04/14/2024)  Stress: No Stress Concern Present (03/21/2024)   Received from [facility]  Tobacco Use: Low Risk  (04/10/2024)    Readmission Risk Interventions    04/15/2024    3:20 PM 04/14/2024    4:33 PM  Readmission Risk Prevention Plan  Transportation Screening Complete Complete  PCP or Specialist Appt within 5-7 Days Complete Complete  Home Care Screening Complete Complete  Medication Review (RN CM) Complete Referral to Pharmacy

## 2024-04-15 NOTE — Progress Notes (Signed)
  Echocardiogram Echocardiogram Transesophageal has been performed.  Koleen KANDICE Popper, RDCS 04/15/2024, 12:08 PM

## 2024-04-15 NOTE — Transfer of Care (Signed)
 Immediate Anesthesia Transfer of Care Note  Patient: Tim [name]  Procedure(s) Performed: TRANSESOPHAGEAL ECHOCARDIOGRAM  Patient Location: PACU and Cath Lab  Anesthesia Type:MAC  Level of Consciousness: awake  Airway & Oxygen Therapy: Patient Spontanous Breathing and Patient connected to nasal cannula oxygen  Post-op Assessment: Report given to RN and Post -op Vital signs reviewed and stable  Post vital signs: stable  Last Vitals:  Vitals Value Taken Time  BP 102/48 04/15/24 12:04  Temp 36.3 C 04/15/24 12:04  Pulse 72 04/15/24 12:04  Resp 18 04/15/24 12:04  SpO2 99 % 04/15/24 12:04    Last Pain:  Vitals:   04/15/24 1204  TempSrc: Tympanic  PainSc: Asleep         Complications: No notable events documented.

## 2024-04-15 NOTE — CV Procedure (Signed)
 TRANSESOPHAGEAL ECHOCARDIOGRAM (TEE) NOTE  INDICATIONS: bioprosthetic valve infective endocarditis  PROCEDURE:   Informed consent was obtained prior to the procedure. The risks, benefits and alternatives for the procedure were discussed and the patient comprehended these risks.  Risks include, but are not limited to, cough, sore throat, vomiting, nausea, somnolence, esophageal and stomach trauma or perforation, bleeding, low blood pressure, aspiration, pneumonia, infection, trauma to the teeth and death.    After a procedural time-out, the patient was given propofol  for sedation by anesthesia. See their separate report.  The patient's heart rate, blood pressure, and oxygen saturation are monitored continuously during the procedure.The oropharynx was anesthetized with topical cetacaine .  The transesophageal probe was inserted in the esophagus and stomach without difficulty and multiple views were obtained.  The patient was kept under observation until the patient left the procedure room.  I was present face-to-face 100% of this time. The patient left the procedure room in stable condition.   Agitated microbubble saline contrast was not administered.  COMPLICATIONS:    There were no immediate complications.  Findings:  LEFT VENTRICLE: The left ventricular wall thickness is mildly increased.  The left ventricular cavity is normal in size. Wall motion is normal.  LVEF is 65-70%.  RIGHT VENTRICLE:  The right ventricle is normal in structure and function without any thrombus or masses.  Pacemaker wires noted without thrombus or vegetation.  LEFT ATRIUM:  The left atrium is moderately dilated in size without any thrombus or masses.  There is not spontaneous echo contrast (smoke) in the left atrium consistent with a low flow state.  LEFT ATRIAL APPENDAGE:  The left atrial appendage is free of any thrombus or masses. The appendage has single lobes. Pulse doppler indicates moderate flow in the  appendage.  ATRIAL SEPTUM:  The atrial septum appears intact and is free of thrombus and/or masses.  There is no evidence for interatrial shunting by color doppler.  RIGHT ATRIUM:  The right atrium is normal in size and function without any thrombus or masses. Pacemaker wires are noted without evidence of thrombus or vegetation.  MITRAL VALVE:  The mitral valve is degenerative demonstrating bileaflet thickening and calcification with Mild regurgitation. There is a mobile calcified mass on the anterior leaflet, which likely represents old vegetation. No active endocarditis is suspected. There is no significant mitral stenosis.   AORTIC VALVE:  The aortic valve has been previously replaced with a 29 mm Edwards Sapien 3 TAVR, however, the valve leaflets appear sclerotic and restricted with no regurgitation.  There is severe stenosis of the valve with mean gradient of 40 mmHg. No masses or vegetations were noted. The valve was visualized with 2D/3D modes.  TRICUSPID VALVE:  The tricuspid valve is normal in structure and function with trivial regurgitation.  There were no vegetations or stenosis   PULMONIC VALVE:  The pulmonic valve is normal in structure and function with no regurgitation.  There were no vegetations or stenosis.   AORTIC ARCH, ASCENDING AND DESCENDING AORTA:  There was grade 1 Shaune et. Al, 1992) atherosclerosis of the aortic arch and proximal descending aorta.  12. PULMONARY VEINS: Anomalous pulmonary venous return was not noted.  13. PERICARDIUM: The pericardium appeared normal and non-thickened.  There is no pericardial effusion.  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  Time Spent Directly with the Patient:  45 minutes   Tim KYM Maxcy, MD, [facility], FNLA, FACP  [place]  [facility]  Medical Director of the [facility]  [facility] Diplomate of the American Board of Clinical Lipidology Attending Cardiologist  Direct Dial: [phone]  Fax: [phone]  Website:  [email or web address] Tim [name] Tim [name] 04/15/2024, 12:03 PM

## 2024-04-15 NOTE — NC FL2 (Signed)
 [place]  MEDICAID FL2 LEVEL OF CARE FORM     IDENTIFICATION  Patient Name: Tim [name] Birthdate: 1940-07-24 Sex: male Admission Date (Current Location): 04/10/2024  [name] and [place] Number:  Producer, television/film/video and Address:  The . [facility], [address], [place], [place] [address]      Provider Number: [id number]  Attending Physician Name and Address:  Darci Pore, MD  Relative Name and Phone Number:  [name] Stage, spouse - [phone]    Current Level of Care: Hospital Recommended Level of Care: Skilled Nursing Facility Prior Approval Number:    Date Approved/Denied:   PASRR Number:    Discharge Plan: SNF    Current Diagnoses: Patient Active Problem List   Diagnosis Date Noted   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   AKI (acute kidney injury) 04/12/2024   Confusion 04/12/2024   Uremia 04/12/2024   Shock (HCC) 04/10/2024   Osteomyelitis of left foot (HCC) 05/03/2023   Venous stasis dermatitis 12/27/2021   Cellulitis 12/14/2021   Wound of right lower extremity 12/14/2021   Transient hypotension 12/14/2021   Heart failure with preserved ejection fraction (HCC) 12/14/2021   Prediabetes 12/14/2021   Thrombocytopenia 12/14/2021   Obesity, Class III, BMI 40-49.9 (morbid obesity) (HCC) 12/14/2021   BPH (benign prostatic hyperplasia) 12/14/2021   Vaccine counseling 11/16/2021   Weakness 02/23/2021   Physical deconditioning 02/23/2021   Rhinitis 07/14/2020   Mitral valve vegetation 05/28/2020   Non-pressure chronic ulcer of other part of left foot limited to breakdown of skin (HCC)    Esophageal dysphagia    Bacteremia    Endocarditis of mitral valve    Fever 04/28/2020   Cervical lymphadenopathy 03/15/2020   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   ICD (implantable cardioverter-defibrillator) infection 12/22/2019   Gram-positive bacteremia 11/27/2019   FUO (fever of unknown origin) 11/26/2019   Hypotension  11/05/2019   Wound dehiscence 10/22/2019   PFO (patent foramen ovale) 10/06/2019   ICD (implantable cardioverter-defibrillator) battery depletion    Pressure injury of skin 09/13/2019   Enterococcal bacteremia history  09/13/2019   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   Urinary retention 09/13/2019   Phimosis 09/13/2019   SIRS (systemic inflammatory response syndrome) (HCC) 09/12/2019   Sepsis (HCC) 09/12/2019   Enterococcus UTI 09/12/2019   Cellulitis of foot    History of transmetatarsal amputation of left foot (HCC)    Subacute osteomyelitis, left ankle and foot (HCC)    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) 04/17/2018   Diabetic neuropathy, painful (HCC) 12/26/2016   Neuropathic pain of foot, left 06/20/2016   Insulin  resistance 07/22/2015   Status post amputation of left great toe 06/08/2015   Spinal stenosis of lumbar region 02/16/2015   Hereditary and idiopathic peripheral neuropathy 01/12/2015   Oral thrush 12/28/2014   Orthostatic dizziness 05/24/2014   Paresthesias with subjective weakness 01/30/2014   Bilateral thigh pain 01/30/2014   Abdominal wall pain in right upper quadrant 01/22/2014   Gross hematuria 09/25/2013   Intertrigo 09/25/2013   IBS (irritable bowel syndrome) 09/11/2013   Chronic pain syndrome 08/06/2013   CAD (coronary artery disease), native coronary artery 12/25/2012   PAF (paroxysmal atrial fibrillation) (HCC) 12/25/2012   Moderate aortic stenosis 12/25/2012   Aortic stenosis 12/24/2012   History of colonic polyps 10/30/2012   Diverticulosis of colon 10/30/2012   Internal hemorrhoids 10/30/2012   Change in bowel habits  intermittent loose stools 10/30/2012   Routine health maintenance 06/09/2012   Hereditary peripheral neuropathy 10/10/2011   Long term current use of anticoagulant - apixiban  08/16/2010   Dyspnea on exertion 09/08/2009   HYPERCHOLESTEROLEMIA-PURE 01/13/2009   Essential hypertension, benign 01/13/2009   EDEMA 01/13/2009   GOUT 01/12/2009   GLAUCOMA 01/12/2009   Venous insufficiency (chronic) (peripheral) 01/12/2009   COPD (chronic obstructive pulmonary disease) (HCC) 01/12/2009   VENTRAL HERNIA 01/12/2009   OSTEOARTHRITIS 01/12/2009   ARTHRITIS 01/12/2009   DEGENERATIVE DISC DISEASE 01/12/2009   CATARACT, HX OF 01/12/2009   HEART MURMUR, HX OF 01/12/2009   BENIGN PROSTATIC HYPERTROPHY, HX OF 01/12/2009   Obesity 01/17/2007   OBSTRUCTIVE SLEEP APNEA 01/17/2007   ASTHMA 01/17/2007   GERD 01/17/2007   HALLUX VALGUS, ACQUIRED 01/17/2007   Pulmonary hypertension (HCC) 01/17/2007    Orientation RESPIRATION BLADDER Height & Weight     Self, Time, Situation, Place  Normal External catheter Weight: 117.7 kg Height:  6' 4 (193 cm)  BEHAVIORAL SYMPTOMS/MOOD NEUROLOGICAL BOWEL NUTRITION STATUS      Incontinent Diet (See discharge summary)  AMBULATORY STATUS COMMUNICATION OF NEEDS Skin   Extensive Assist Verbally Other (Comment) (Stage 2 wound to coccyx)                       Personal Care Assistance Level of Assistance  Bathing, Feeding, Dressing, Total care Bathing Assistance: Limited assistance Feeding assistance: Limited assistance Dressing Assistance: Maximum assistance     Functional Limitations Info  Sight, Hearing, Speech Sight Info: Adequate Hearing Info: Adequate Speech Info: Adequate    SPECIAL CARE FACTORS FREQUENCY  PT (By licensed PT), OT (By licensed OT)     PT Frequency: 5 x per week OT Frequency: 5 x per week            Contractures Contractures Info: Not present    Additional Factors Info  Code Status, Allergies, Insulin  Sliding Scale Code Status Info: DNR Allergies Info: brimonidine, brinzolamide, latanoprost, sulfa, timolol, diltiazem, rofecoxib, tamsulosin, celecoxib, colchicine, tape   Insulin  Sliding Scale  Info: Novolog  SQ 3 x a day with meals and Q HS       Current Medications (04/15/2024):  This is the current hospital active medication list Current Facility-Administered Medications  Medication Dose Route Frequency Provider Last Rate Last Admin   acetaminophen  (TYLENOL ) tablet 650 mg  650 mg Oral Q6H PRN Mona Vinie BROCKS, MD   650 mg at 04/13/24 2222   aspirin  EC tablet 81 mg  81 mg Oral Daily Mona Vinie BROCKS, MD   81 mg at 04/15/24 9266   cefTRIAXone  (ROCEPHIN ) 2 g in sodium chloride  0.9 % 100 mL IVPB  2 g Intravenous Q24H Mona Vinie BROCKS, MD 200 mL/hr at 04/15/24 1006 2 g at 04/15/24 1006   Chlorhexidine  Gluconate Cloth 2 % PADS 6 each  6 each Topical Daily Mona Vinie BROCKS, MD   6 each at 04/15/24 0818   docusate sodium  (COLACE) capsule 100 mg  100 mg Oral BID PRN Mona Vinie BROCKS, MD       feeding supplement (BOOST / RESOURCE BREEZE) liquid 1 Container  1 Container Oral TID BM Mona Vinie BROCKS, MD   1 Container at 04/15/24 1309   fentaNYL  (SUBLIMAZE ) injection 25 mcg  25 mcg Intravenous Q4H PRN Hilty, Kenneth C, MD   25 mcg at 04/15/24 0732   finasteride  (PROSCAR ) tablet 5 mg  5 mg Oral Daily Hilty, Vinie BROCKS, MD   5  mg at 04/15/24 0732   gabapentin  (NEURONTIN ) capsule 300 mg  300 mg Oral TID Mona Vinie BROCKS, MD   300 mg at 04/15/24 1516   insulin  aspart (novoLOG ) injection 0-5 Units  0-5 Units Subcutaneous QHS Hilty, Vinie BROCKS, MD       insulin  aspart (novoLOG ) injection 0-9 Units  0-9 Units Subcutaneous TID WC Mona Vinie BROCKS, MD   1 Units at 04/15/24 0926   lidocaine  (LIDODERM ) 5 % 1 patch  1 patch Transdermal Q24H Mona Vinie BROCKS, MD   1 patch at 04/14/24 1631   linezolid  (ZYVOX ) IVPB 600 mg  600 mg Intravenous Q12H Mona Vinie BROCKS, MD 300 mL/hr at 04/15/24 0747 600 mg at 04/15/24 0747   melatonin tablet 3 mg  3 mg Oral QHS Mona Vinie BROCKS, MD   3 mg at 04/14/24 2131   nitroGLYCERIN  (NITROGLYN) 2 % ointment 0.5 inch  0.5 inch Topical Q6H PRN Mona Vinie BROCKS, MD       nystatin   (MYCOSTATIN /NYSTOP ) topical powder   Topical BID Mona Vinie BROCKS, MD   Given at 04/15/24 0732   Oral care mouth rinse  15 mL Mouth Rinse PRN Hilty, Vinie BROCKS, MD       oxyCODONE  (Oxy IR/ROXICODONE ) immediate release tablet 5 mg  5 mg Oral Q4H PRN Mona Vinie BROCKS, MD   5 mg at 04/15/24 1516   polyethylene glycol (MIRALAX / GLYCOLAX) packet 17 g  17 g Oral Daily PRN Mona Vinie BROCKS, MD         Discharge Medications: Please see discharge summary for a list of discharge medications.  Relevant Imaging Results:  Relevant Lab Results:   Additional Information SS # [id number]  Rosaline JONELLE Joe, RN

## 2024-04-15 NOTE — Progress Notes (Signed)
 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  He was taking extra gabapentin  and extra oxycodone .  Has decreased oral intake.  Patient got IV fluid resuscitation, admitted to ICU for hypotension, AKI requiring vasopressors.  04/11/2024 echocardiogram showed possible vegetation versus calcium  on anterior mitral leaflet.  Cardiology and ID consulted.  Patient's blood pressure improved off pressors, transferred to TRH service 04/14/2024.  Assessment and Plan: Hypovolemic shock-resolved Possible septic shock- Patient is off pressors now. Low blood pressures could be from his pain meds, muscle relaxants, gabapentin  which he is taking extra doses. COVID/ flu negative. Blood cultures no growth to date. UA unremarkable, MRSA screen negative. DVT study negative noted. Echocardiogram possible mitral leaflet vegetation. 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 IV antibiotics Rocephin  and Zyvox  therapy transitioned to prior suppressive Amoxicillin  1gm bid (home dose) as per ID. PT/OT advised STR, TOC working on placement.  History of CAD s/p PCI. 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 Status post ICD with CRT in 2019. Patient had left heart cath September 2025, showed 95% ostial diagonal stenosis, small vessel and medical management recommended. Repeat echocardiogram showed possible vegetation versus calcification on anterior mitral leaflet. 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 Cardiology advised structural heart eval for possible severe aortic stenosis. He is off Eliquis  due to hematuria on presentation, this seems improving.   Atrial fibrillation: Rate controlled heart rate 79-93. Eliquis  on hold due to hematuria. Hold Coreg due to low blood pressures.  Acute kidney injury-prerenal In the setting of shock. Urine output gradually improving. Continue to monitor daily electrolytes, renal function. Avoid nephrotoxic drugs.  BPH- Hematuria - improving. Continue Proscar .  Urology follow-up appreciated.  Patient need outpatient evaluation with hematuria protocol CT scan and cystoscopy per urology.  Hypokalemia- Oral potassium supplementation as needed.  Chronic pain syndrome- Patient is on oxycodone , and gabapentin  therapy at home. Caution with pain medications due to low blood pressures. He is at risk for polypharmacy.   Gout-continue to hold allopurinol  due to AKI.  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 Diet, exercise and weight reduction advised.      Out of bed to chair. Incentive spirometry. Nursing supportive care. Fall, aspiration precautions. Diet:  Diet Orders (From admission, onward)     Start     Ordered   04/15/24 1300  Diet Heart Room service appropriate? Yes; Fluid consistency: Thin  Diet effective now       Question Answer Comment  Room service appropriate? Yes   Fluid consistency: Thin      04/15/24 1259           DVT prophylaxis:   Level of care: Telemetry Medical   Code Status: Limited: Do not attempt resuscitation (DNR) -DNR-LIMITED -Do Not Intubate/DNI   Subjective: Patient is seen and examined today after TEE procedure.  He is lying in bed, wife at bedside. He is weak, eating fair. Did not get out of bed. TOC discussed STR placement with wife.  Physical Exam: Vitals:   04/15/24  1214 04/15/24 1224 04/15/24 1254 04/15/24 1631  BP: (!) 106/53 (!)  114/53 (!) 117/47 (!) 109/50  Pulse: 82 79 90 85  Resp: 17 17 18 18   Temp:   98.6 F (37 C) (!) 97.1 F (36.2 C)  TempSrc:      SpO2: 96% 97% 97% 98%  Weight:      Height:        General - Elderly obese weak Caucasian male, no apparent distress HEENT - PERRLA, EOMI, atraumatic head, non tender sinuses. Lung - Clear, diffuse rales, rhonchi, no wheezes. Heart - S1, S2 heard, no murmurs, rubs, 1+ extremity edema. Abdomen - Soft, non tender, obese, bowel sounds good Neuro - Alert, awake and oriented x 3, non focal exam. Skin - Warm and dry.  Chronic skin changes bilateral lower extremities  Data Reviewed:      Latest Ref Rng & Units 04/15/2024    3:30 AM 04/13/2024    2:02 AM 04/12/2024    2:44 AM  CBC  WBC 4.0 - 10.5 K/uL 5.9  7.3  9.8   Hemoglobin 13.0 - 17.0 g/dL 8.9  9.6  89.5   Hematocrit 39.0 - 52.0 % 25.6  27.3  29.9   Platelets 150 - 400 K/uL 76  105  134       Latest Ref Rng & Units 04/15/2024    3:30 AM 04/14/2024    4:52 AM 04/13/2024    2:02 AM  BMP  Glucose 70 - 99 mg/dL 888  886  874   BUN 8 - 23 mg/dL 5  12  26    Creatinine 0.61 - 1.24 mg/dL 9.31  9.15  8.98   Sodium 135 - 145 mmol/L 137  138  135   Potassium 3.5 - 5.1 mmol/L 3.6  3.4  3.3   Chloride 98 - 111 mmol/L 103  103  100   CO2 22 - 32 mmol/L 24  25  25    Calcium  8.9 - 10.3 mg/dL 8.4  8.7  8.5    ECHO TEE Result Date: 04/15/2024    TRANSESOPHOGEAL ECHO REPORT   Patient Name:   Tim [name] Date of Exam: 04/15/2024 Medical Rec #:  [id number]       Height:       76.0 in Accession #:    [id number]      Weight:       259.5 lb Date of Birth:  1940/09/15       BSA:          2.474 m Patient Age:    83 years        BP:           121/51 mmHg Patient Gender: M               HR:           79 bpm. Exam Location:  Inpatient Procedure: Transesophageal Echo, Color Doppler, Cardiac Doppler and 3D Echo            (Both Spectral and Color Flow Doppler were utilized during            procedure). Indications:      Aortic Stenosis  History:         Patient has prior history of Echocardiogram examinations, most                  recent 04/10/2024. CHF, Previous Myocardial Infarction and CAD,  Defibrillator and Pacemaker, Pulmonary HTN and COPD, s/p AVR,                  29 mm Edwards Sapien prosthetic, stented (TAVR) valve present                  in the aortic position., Aortic Valve Disease and Mitral Valve                  Disease; Risk Factors:Sleep Apnea and Hypertension.                  Aortic Valve: 29 mm Sapien prosthetic, stented (TAVR) valve is                  present in the aortic position.  Sonographer:     Koleen Popper RDCS Referring Phys:  [id number] WADDELL A PARCELLS Diagnosing Phys: Vinie Maxcy MD PROCEDURE: After discussion of the risks and benefits of a TEE, an informed consent was obtained from the patient. The transesophogeal probe was passed without difficulty through the esophogus of the patient. Imaged were obtained with the patient in a supine position. Sedation performed by different physician. The patient was monitored while under deep sedation. Anesthestetic sedation was provided intravenously by Anesthesiology: 220mg  of Propofol , 60mg  of Lidocaine . The patient developed no complications during the procedure.  IMPRESSIONS  1. Left ventricular ejection fraction, by estimation, is 65 to 70%. The left ventricle has normal function. There is mild left ventricular hypertrophy.  2. Right ventricular systolic function is normal. The right ventricular size is normal.  3. Calcified small mobile mass on the anterior leaflet, likely old vegetation. The mitral valve is degenerative. Mild mitral valve regurgitation.  4. The aortic valve has been repaired/replaced. There is a 29 mm Sapien prosthetic (TAVR) valve present in the aortic position. Echo findings are consistent with stenosis of the aortic prosthesis. EOA, by VTI measures 0.55 cm. Aortic valve mean gradient measures 40.0 mmHg. Aortic  valve Vmax measures 4.13 m/s. Peak gradient 68.2 mmHg, DI 0.20.  5. 3D performed of the aortic valve and demonstrates 3D evaluation of the aortic valve prosthesis demonstrates severe prosthetic aortic valve stenosis.  6. Left atrial size was moderately dilated. No left atrial/left atrial appendage thrombus was detected. Conclusion(s)/Recommendation(s): No evidence of vegetation/infective endocarditis on this transesophageael echocardiogram. Severe stenosis of the aortic TAVR valve- consider structural heart team evaluation for possible valve-in-valve procedure if there are not other contraindications to the procedure. FINDINGS  Left Ventricle: Left ventricular ejection fraction, by estimation, is 65 to 70%. The left ventricle has normal function. The left ventricular internal cavity size was normal in size. There is mild left ventricular hypertrophy. Right Ventricle: The right ventricular size is normal. No increase in right ventricular wall thickness. Right ventricular systolic function is normal. Left Atrium: Left atrial size was moderately dilated. No left atrial/left atrial appendage thrombus was detected. Right Atrium: Right atrial size was normal in size. Pericardium: There is no evidence of pericardial effusion. Mitral Valve: Calcified small mobile mass on the anterior leaflet, likely old vegetation. The mitral valve is degenerative in appearance. Mild to moderate mitral annular calcification. Mild mitral valve regurgitation. Tricuspid Valve: The tricuspid valve is grossly normal. Tricuspid valve regurgitation is trivial. Aortic Valve: The aortic valve has been repaired/replaced. Aortic valve regurgitation is not visualized. Severe aortic stenosis is present. Aortic valve mean gradient measures 40.0 mmHg. Aortic valve peak gradient measures 68.2 mmHg. Aortic valve area, by VTI measures 0.55 cm. There  is a 29 mm Sapien prosthetic, stented (TAVR) valve present in the aortic position. Pulmonic Valve: The  pulmonic valve was grossly normal. Pulmonic valve regurgitation is not visualized. Aorta: The aortic root and ascending aorta are structurally normal, with no evidence of dilitation. IAS/Shunts: No atrial level shunt detected by color flow Doppler. Additional Comments: 3D was performed not requiring image post processing on an independent workstation and was abnormal. Spectral Doppler performed. LEFT VENTRICLE PLAX 2D LVOT diam:     1.90 cm LV SV:         46 LV SV Index:   19 LVOT Area:     2.84 cm  AORTIC VALVE AV Area (Vmax):    0.74 cm AV Area (Vmean):   0.77 cm AV Area (VTI):     0.55 cm AV Vmax:           413.00 cm/s AV Vmean:          296.000 cm/s AV VTI:            0.834 m AV Peak Grad:      68.2 mmHg AV Mean Grad:      40.0 mmHg LVOT Vmax:         108.00 cm/s LVOT Vmean:        80.400 cm/s LVOT VTI:          0.163 m LVOT/AV VTI ratio: 0.20  AORTA Ao Asc diam: 3.50 cm  SHUNTS Systemic VTI:  0.16 m Systemic Diam: 1.90 cm Vinie Maxcy MD Electronically signed by Vinie Maxcy MD Signature Date/Time: 04/15/2024/3:20:08 PM    Final    EP STUDY Result Date: 04/15/2024 See surgical note for result.  VAS US  LOWER EXTREMITY VENOUS (DVT) Result Date: 04/14/2024  Lower Venous DVT Study Patient Name:  Tim [name]  Date of Exam:   04/14/2024 Medical Rec #: [id number]        Accession #:    [id number] Date of Birth: 07-Aug-1940        Patient Gender: M Patient Age:   64 years Exam Location:  [facility] Procedure:      VAS US  LOWER EXTREMITY VENOUS (DVT) Referring Phys: [facility] --------------------------------------------------------------------------------  Indications: Swelling, and Edema.  Comparison Study: Previous study of bilateral lower extremities on 1.6.2023. Performing Technologist: Edilia Elden Appl  Examination Guidelines: A complete evaluation includes B-mode imaging, spectral Doppler, color Doppler, and power Doppler as needed of all accessible portions of each vessel.  Bilateral testing is considered an integral part of a complete examination. Limited examinations for reoccurring indications may be performed as noted. The reflux portion of the exam is performed with the patient in reverse Trendelenburg.  +-----+---------------+---------+-----------+----------+--------------+ RIGHTCompressibilityPhasicitySpontaneityPropertiesThrombus Aging +-----+---------------+---------+-----------+----------+--------------+ CFV  Full           Yes      Yes                                 +-----+---------------+---------+-----------+----------+--------------+ SFJ  Full           Yes      Yes                                 +-----+---------------+---------+-----------+----------+--------------+   +---------+---------------+---------+-----------+----------+-------------------+ LEFT     CompressibilityPhasicitySpontaneityPropertiesThrombus Aging      +---------+---------------+---------+-----------+----------+-------------------+ CFV      Full  Yes      Yes                                      +---------+---------------+---------+-----------+----------+-------------------+ SFJ      Full           Yes      Yes                                      +---------+---------------+---------+-----------+----------+-------------------+ FV Prox  Full                                                             +---------+---------------+---------+-----------+----------+-------------------+ FV Mid   Full                                                             +---------+---------------+---------+-----------+----------+-------------------+ FV DistalFull                                                             +---------+---------------+---------+-----------+----------+-------------------+ PFV      Full                                                              +---------+---------------+---------+-----------+----------+-------------------+ POP      Full           Yes      Yes                                      +---------+---------------+---------+-----------+----------+-------------------+ PTV                                                   Not well visualized +---------+---------------+---------+-----------+----------+-------------------+ PERO                                                  Not well visualized +---------+---------------+---------+-----------+----------+-------------------+ Fluid collection noted in the left popliteal fossa measuring 4.2 x 0.7 x x1.5 cm. Left posterior tibial veins and peroneal veins were not visualized due to wrap around bandages.    Summary: RIGHT: - No evidence of common femoral vein obstruction.   LEFT: - There is no evidence of deep vein thrombosis in the lower extremity.  -  A cystic structure is found in the popliteal fossa.  *See table(s) above for measurements and observations. Electronically signed by Debby Robertson on 04/14/2024 at 10:47:34 AM.    Final     Family Communication: Discussed with patient, wife at bedside. They understand and agree. All questions answered.  Disposition: Status is: Inpatient Remains inpatient appropriate because:monitor h/h, hematuria, debility, placement  Planned Discharge Destination: Rehab     Time spent: 48 minutes  Author: Concepcion Riser, MD 04/15/2024 4:49 PM Secure chat 7am to 7pm For on call review [email or web address].

## 2024-04-16 ENCOUNTER — Encounter (HOSPITAL_COMMUNITY): Payer: Self-pay | Admitting: Internal Medicine

## 2024-04-16 DIAGNOSIS — R579 Shock, unspecified: Secondary | ICD-10-CM | POA: Diagnosis not present

## 2024-04-16 DIAGNOSIS — T82857D Stenosis of cardiac prosthetic devices, implants and grafts, subsequent encounter: Secondary | ICD-10-CM | POA: Diagnosis not present

## 2024-04-16 DIAGNOSIS — N179 Acute kidney failure, unspecified: Secondary | ICD-10-CM | POA: Diagnosis not present

## 2024-04-16 LAB — CBC
HCT: 23.4 % — ABNORMAL LOW (ref 39.0–52.0)
Hemoglobin: 8.1 g/dL — ABNORMAL LOW (ref 13.0–17.0)
MCH: 33.5 pg (ref 26.0–34.0)
MCHC: 34.6 g/dL (ref 30.0–36.0)
MCV: 96.7 fL (ref 80.0–100.0)
Platelets: 77 K/uL — ABNORMAL LOW (ref 150–400)
RBC: 2.42 MIL/uL — ABNORMAL LOW (ref 4.22–5.81)
RDW: 14 % (ref 11.5–15.5)
WBC: 5.8 K/uL (ref 4.0–10.5)
nRBC: 0 % (ref 0.0–0.2)

## 2024-04-16 LAB — BASIC METABOLIC PANEL WITH GFR
Anion gap: 9 (ref 5–15)
BUN: 6 mg/dL — ABNORMAL LOW (ref 8–23)
CO2: 24 mmol/L (ref 22–32)
Calcium: 8.2 mg/dL — ABNORMAL LOW (ref 8.9–10.3)
Chloride: 103 mmol/L (ref 98–111)
Creatinine, Ser: 0.69 mg/dL (ref 0.61–1.24)
GFR, Estimated: >60 mL/min (ref >60)
Glucose, Bld: 105 mg/dL — ABNORMAL HIGH (ref 70–99)
Potassium: 3.6 mmol/L (ref 3.5–5.1)
Sodium: 136 mmol/L (ref 135–145)

## 2024-04-16 LAB — GLUCOSE, CAPILLARY
Glucose-Capillary: 104 mg/dL — ABNORMAL HIGH (ref 70–99)
Glucose-Capillary: 135 mg/dL — ABNORMAL HIGH (ref 70–99)
Glucose-Capillary: 117 mg/dL — ABNORMAL HIGH (ref 70–99)
Glucose-Capillary: 129 mg/dL — ABNORMAL HIGH (ref 70–99)

## 2024-04-16 MED ORDER — TRAZODONE HCL 50 MG PO TABS
50.0000 mg | ORAL_TABLET | Freq: Every evening | ORAL | Status: DC | PRN
  Administered 2024-04-16 (×2): 50 mg via ORAL
  Filled 2024-04-16 (×2): qty 1

## 2024-04-16 MED ORDER — AMOXICILLIN 500 MG PO CAPS
1000.0000 mg | ORAL_CAPSULE | Freq: Two times a day (BID) | ORAL | Status: DC
  Administered 2024-04-16 – 2024-04-18 (×5): 1000 mg via ORAL
  Filled 2024-04-16 (×5): qty 2

## 2024-04-16 MED ORDER — ALUM & MAG HYDROXIDE-SIMETH 200-200-20 MG/5ML PO SUSP
15.0000 mL | Freq: Four times a day (QID) | ORAL | Status: DC | PRN
  Administered 2024-04-16: 15 mL via ORAL
  Filled 2024-04-16: qty 30

## 2024-04-16 MED ORDER — LOPERAMIDE HCL 2 MG PO CAPS
2.0000 mg | ORAL_CAPSULE | ORAL | Status: DC | PRN
  Administered 2024-04-16 – 2024-04-17 (×2): 2 mg via ORAL
  Filled 2024-04-16 (×2): qty 1

## 2024-04-16 NOTE — Plan of Care (Signed)

## 2024-04-16 NOTE — Congregational Nurse Program (Signed)
 Pt had trouble sleeping and asked this nurse to not wake him up when finally asleep. Trazodone and melatonin given, MD aware.

## 2024-04-16 NOTE — Progress Notes (Signed)
  [facility]   MULTIDISCIPLINARY HEART VALVE TEAM  Structural heart team consulted for consideration of VIV TAVR done in 2017 at [facility] with Dr. Sheppard Bihari. Recently had a cath with Dr. Bihari 03/24/24. We have discussed that given his history with Dr. Bihari at [facility] and our backlong and inability to see him until next month, he should return there to discuss VIV TAVR. I have spoken to their structural heart nurse navigator Andriette they and they have arranged for an apt with Dr. Dorine structural PA on 04/24/24 for consultation and to set up TAVR scans. We have powershared the TTE and TEE done here during this admission.   The patient and wife agreed to do the valve work up and pacemaker generator change at [facility] and then potentially transfer care to [facility] in the future.   Lamarr Hummer PA-C  [facility]  Pager [phone]

## 2024-04-16 NOTE — Progress Notes (Signed)
 Patient has Dreamstation CPAP at bedside and refused. Patient does have his own cpap. Patient states he wants a new sleep study done before wearing CPAP.

## 2024-04-16 NOTE — Progress Notes (Signed)
 Progress Note  Patient Name: Tim [name] Date of Encounter: 04/16/2024  Primary Cardiologist:   Redell Shallow, MD   Subjective   BP 101/51.  Cr 0.69.  Denies any chest pain or dyspnea.    Inpatient Medications    Scheduled Meds:  aspirin  EC  81 mg Oral Daily   Chlorhexidine  Gluconate Cloth  6 each Topical Daily   feeding supplement  1 Container Oral TID BM   finasteride   5 mg Oral Daily   gabapentin   300 mg Oral TID   insulin  aspart  0-5 Units Subcutaneous QHS   insulin  aspart  0-9 Units Subcutaneous TID WC   lidocaine   1 patch Transdermal Q24H   melatonin  3 mg Oral QHS   nystatin    Topical BID   Continuous Infusions:  cefTRIAXone  (ROCEPHIN )  IV 2 g (04/15/24 1006)   linezolid  (ZYVOX ) IV 600 mg (04/15/24 2053)   PRN Meds: acetaminophen , docusate sodium , nitroGLYCERIN , mouth rinse, oxyCODONE , polyethylene glycol, traZODone   Vital Signs    Vitals:   04/15/24 1254 04/15/24 1631 04/15/24 2043 04/16/24 0020  BP: (!) 117/47 (!) 109/50 (!) 107/51 (!) 101/51  Pulse: 90 85 84 86  Resp: 18 18  18   Temp: 98.6 F (37 C) (!) 97.1 F (36.2 C) 99.7 F (37.6 C) 98.4 F (36.9 C)  TempSrc:   Oral Oral  SpO2: 97% 98% 98% 97%  Weight:      Height:        Intake/Output Summary (Last 24 hours) at 04/16/2024 0826 Last data filed at 04/15/2024 2053 Gross per 24 hour  Intake 100 ml  Output 900 ml  Net -800 ml   Filed Weights   04/13/24 0500 04/14/24 0400 04/15/24 0409  Weight: 114.1 kg 116.3 kg 117.7 kg    Telemetry    V paced- Personally Reviewed  ECG    NA - Personally Reviewed  Physical Exam   GEN: No acute distress.   Neck: No  JVD Cardiac: RRR, 3/6 apical systolic murmur, no diastolic murmurs, rubs, or gallops.  Respiratory:     Decreased breath sounds at the bases.  GI: Soft, nontender, non-distended  MS:    Diffuse upper extremity edema, mild lower edema; No deformity. Neuro:  Nonfocal  Psych: Normal affect   Labs    Chemistry Recent  Labs  Lab 04/10/24 0548 04/10/24 0601 04/14/24 0452 04/15/24 0330 04/16/24 0131  NA 130*   < > 138 137 136  K 4.9   < > 3.4* 3.6 3.6  CL 91*   < > 103 103 103  CO2 25   < > 25 24 24   GLUCOSE 144*   < > 113* 111* 105*  BUN 83*   < > 12 5* 6*  CREATININE 3.12*   < > 0.84 0.68 0.69  CALCIUM  9.0   < > 8.7* 8.4* 8.2*  PROT 6.5  --   --   --   --   ALBUMIN 3.4*  --   --   --   --   AST 75*  --   --   --   --   ALT 36  --   --   --   --   ALKPHOS 90  --   --   --   --   BILITOT 0.9  --   --   --   --   GFRNONAA 19*   < > >60 >60 >60  ANIONGAP 14   < >  10 10 9    < > = values in this interval not displayed.     Hematology Recent Labs  Lab 04/13/24 0202 04/15/24 0330 04/16/24 0131  WBC 7.3 5.9 5.8  RBC 2.88* 2.69* 2.42*  HGB 9.6* 8.9* 8.1*  HCT 27.3* 25.6* 23.4*  MCV 94.8 95.2 96.7  MCH 33.3 33.1 33.5  MCHC 35.2 34.8 34.6  RDW 13.9 14.1 14.0  PLT 105* 76* 77*    Cardiac EnzymesNo results for input(s): TROPONINI in the last 168 hours. No results for input(s): TROPIPOC in the last 168 hours.   BNP Recent Labs  Lab 04/10/24 0548  BNP 60.6     DDimer No results for input(s): DDIMER in the last 168 hours.   Radiology    ECHO TEE Result Date: 04/15/2024    TRANSESOPHOGEAL ECHO REPORT   Patient Name:   Tim [name] Date of Exam: 04/15/2024 Medical Rec #:  [id number]       Height:       76.0 in Accession #:    [id number]      Weight:       259.5 lb Date of Birth:  Nov 11, 1940       BSA:          2.474 m Patient Age:    83 years        BP:           121/51 mmHg Patient Gender: M               HR:           79 bpm. Exam Location:  Inpatient Procedure: Transesophageal Echo, Color Doppler, Cardiac Doppler and 3D Echo            (Both Spectral and Color Flow Doppler were utilized during            procedure). Indications:     Aortic Stenosis  History:         Patient has prior history of Echocardiogram examinations, most                  recent 04/10/2024. CHF, Previous  Myocardial Infarction and CAD,                  Defibrillator and Pacemaker, Pulmonary HTN and COPD, s/p AVR,                  29 mm Edwards Sapien prosthetic, stented (TAVR) valve present                  in the aortic position., Aortic Valve Disease and Mitral Valve                  Disease; Risk Factors:Sleep Apnea and Hypertension.                  Aortic Valve: 29 mm Sapien prosthetic, stented (TAVR) valve is                  present in the aortic position.  Sonographer:     Koleen Popper RDCS Referring Phys:  [id number] WADDELL A PARCELLS Diagnosing Phys: Vinie Maxcy MD PROCEDURE: After discussion of the risks and benefits of a TEE, an informed consent was obtained from the patient. The transesophogeal probe was passed without difficulty through the esophogus of the patient. Imaged were obtained with the patient in a supine position. Sedation performed by different physician. The patient was monitored while under deep sedation. Anesthestetic  sedation was provided intravenously by Anesthesiology: 220mg  of Propofol , 60mg  of Lidocaine . The patient developed no complications during the procedure.  IMPRESSIONS  1. Left ventricular ejection fraction, by estimation, is 65 to 70%. The left ventricle has normal function. There is mild left ventricular hypertrophy.  2. Right ventricular systolic function is normal. The right ventricular size is normal.  3. Calcified small mobile mass on the anterior leaflet, likely old vegetation. The mitral valve is degenerative. Mild mitral valve regurgitation.  4. The aortic valve has been repaired/replaced. There is a 29 mm Sapien prosthetic (TAVR) valve present in the aortic position. Echo findings are consistent with stenosis of the aortic prosthesis. EOA, by VTI measures 0.55 cm. Aortic valve mean gradient measures 40.0 mmHg. Aortic valve Vmax measures 4.13 m/s. Peak gradient 68.2 mmHg, DI 0.20.  5. 3D performed of the aortic valve and demonstrates 3D evaluation of the aortic  valve prosthesis demonstrates severe prosthetic aortic valve stenosis.  6. Left atrial size was moderately dilated. No left atrial/left atrial appendage thrombus was detected. Conclusion(s)/Recommendation(s): No evidence of vegetation/infective endocarditis on this transesophageael echocardiogram. Severe stenosis of the aortic TAVR valve- consider structural heart team evaluation for possible valve-in-valve procedure if there are not other contraindications to the procedure. FINDINGS  Left Ventricle: Left ventricular ejection fraction, by estimation, is 65 to 70%. The left ventricle has normal function. The left ventricular internal cavity size was normal in size. There is mild left ventricular hypertrophy. Right Ventricle: The right ventricular size is normal. No increase in right ventricular wall thickness. Right ventricular systolic function is normal. Left Atrium: Left atrial size was moderately dilated. No left atrial/left atrial appendage thrombus was detected. Right Atrium: Right atrial size was normal in size. Pericardium: There is no evidence of pericardial effusion. Mitral Valve: Calcified small mobile mass on the anterior leaflet, likely old vegetation. The mitral valve is degenerative in appearance. Mild to moderate mitral annular calcification. Mild mitral valve regurgitation. Tricuspid Valve: The tricuspid valve is grossly normal. Tricuspid valve regurgitation is trivial. Aortic Valve: The aortic valve has been repaired/replaced. Aortic valve regurgitation is not visualized. Severe aortic stenosis is present. Aortic valve mean gradient measures 40.0 mmHg. Aortic valve peak gradient measures 68.2 mmHg. Aortic valve area, by VTI measures 0.55 cm. There is a 29 mm Sapien prosthetic, stented (TAVR) valve present in the aortic position. Pulmonic Valve: The pulmonic valve was grossly normal. Pulmonic valve regurgitation is not visualized. Aorta: The aortic root and ascending aorta are structurally normal,  with no evidence of dilitation. IAS/Shunts: No atrial level shunt detected by color flow Doppler. Additional Comments: 3D was performed not requiring image post processing on an independent workstation and was abnormal. Spectral Doppler performed. LEFT VENTRICLE PLAX 2D LVOT diam:     1.90 cm LV SV:         46 LV SV Index:   19 LVOT Area:     2.84 cm  AORTIC VALVE AV Area (Vmax):    0.74 cm AV Area (Vmean):   0.77 cm AV Area (VTI):     0.55 cm AV Vmax:           413.00 cm/s AV Vmean:          296.000 cm/s AV VTI:            0.834 m AV Peak Grad:      68.2 mmHg AV Mean Grad:      40.0 mmHg LVOT Vmax:         108.00  cm/s LVOT Vmean:        80.400 cm/s LVOT VTI:          0.163 m LVOT/AV VTI ratio: 0.20  AORTA Ao Asc diam: 3.50 cm  SHUNTS Systemic VTI:  0.16 m Systemic Diam: 1.90 cm Vinie Maxcy MD Electronically signed by Vinie Maxcy MD Signature Date/Time: 04/15/2024/3:20:08 PM    Final    EP STUDY Result Date: 04/15/2024 See surgical note for result.  VAS US  LOWER EXTREMITY VENOUS (DVT) Result Date: 04/14/2024  Lower Venous DVT Study Patient Name:  Tim [name]  Date of Exam:   04/14/2024 Medical Rec #: [id number]        Accession #:    [id number] Date of Birth: 10/19/1940        Patient Gender: M Patient Age:   60 years Exam Location:  [name] Procedure:      VAS US  LOWER EXTREMITY VENOUS (DVT) Referring Phys: [facility] --------------------------------------------------------------------------------  Indications: Swelling, and Edema.  Comparison Study: Previous study of bilateral lower extremities on 1.6.2023. Performing Technologist: Edilia Elden Appl  Examination Guidelines: A complete evaluation includes B-mode imaging, spectral Doppler, color Doppler, and power Doppler as needed of all accessible portions of each vessel. Bilateral testing is considered an integral part of a complete examination. Limited examinations for reoccurring indications may be performed as noted. The  reflux portion of the exam is performed with the patient in reverse Trendelenburg.  +-----+---------------+---------+-----------+----------+--------------+ RIGHTCompressibilityPhasicitySpontaneityPropertiesThrombus Aging +-----+---------------+---------+-----------+----------+--------------+ CFV  Full           Yes      Yes                                 +-----+---------------+---------+-----------+----------+--------------+ SFJ  Full           Yes      Yes                                 +-----+---------------+---------+-----------+----------+--------------+   +---------+---------------+---------+-----------+----------+-------------------+ LEFT     CompressibilityPhasicitySpontaneityPropertiesThrombus Aging      +---------+---------------+---------+-----------+----------+-------------------+ CFV      Full           Yes      Yes                                      +---------+---------------+---------+-----------+----------+-------------------+ SFJ      Full           Yes      Yes                                      +---------+---------------+---------+-----------+----------+-------------------+ FV Prox  Full                                                             +---------+---------------+---------+-----------+----------+-------------------+ FV Mid   Full                                                             +---------+---------------+---------+-----------+----------+-------------------+  FV DistalFull                                                             +---------+---------------+---------+-----------+----------+-------------------+ PFV      Full                                                             +---------+---------------+---------+-----------+----------+-------------------+ POP      Full           Yes      Yes                                       +---------+---------------+---------+-----------+----------+-------------------+ PTV                                                   Not well visualized +---------+---------------+---------+-----------+----------+-------------------+ PERO                                                  Not well visualized +---------+---------------+---------+-----------+----------+-------------------+ Fluid collection noted in the left popliteal fossa measuring 4.2 x 0.7 x x1.5 cm. Left posterior tibial veins and peroneal veins were not visualized due to wrap around bandages.    Summary: RIGHT: - No evidence of common femoral vein obstruction.   LEFT: - There is no evidence of deep vein thrombosis in the lower extremity.  - A cystic structure is found in the popliteal fossa.  *See table(s) above for measurements and observations. Electronically signed by Debby Robertson on 04/14/2024 at 10:47:34 AM.    Final      Cardiac Studies   See above  Patient Profile     83 y.o. 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  Cardiology consulted for severe prosthetic valve stenosis and possible mitral valve vegetation.    Assessment & Plan    Shock:  Resolved. Thought to be related to hypovolemia and responsive to volume.  Blood cultures negative to date.  Holding home blood pressure medications.  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  Echocardiogram this admission shows EF 65 to 70%, normal RV function, possible vegetation versus calcium  on anterior mitral valve leaflet, severe bioprosthetic aortic valve stenosis with mean gradient 46 mmHg, V-max 4.6 m/s, EOA  0.8 cm.  Cath 03/24/24 at [facility] showed stenosis in small branch for which medical management was recommended, also with 60% mid RCA stenosis. -TEE yesterday showed EF 65 to 70%, normal RV function calcified small  mobile mass on anterior mitral valve leaflet likely old vegetation, mild mitral regurgitation, severe prosthetic aortic valve stenosis with V-max 4.1 m/s, mean gradient 40 mmHg, EOA 0.6 cm, DI 0.2 -Given no evidence of active infection, and patient now with severe stenosis of TAVR valve, and considering on previous evaluation by cardiac surgery not felt to be surgical candidate, will refer to structural heart team to consider valve-in-valve TAVR.     Mitral valve lesion: This is thought to be calcification rather than active vegetation.  To further clarify with TEE.   AKI: Creatinine continues to improve with hydration, has resolved   CAD: No current acute coronary symptoms.  Ad[name] 03/2024 with NSTEMI, showed 95% ostial diagonal stenosis, small vessel and medical management recommended.  Plan continued medical management.  Discontinued aspirin  as he is on Eliquis  chronically, would restart while eliquis  on hold due to history of coronary stenting   PAF: No recurrent paroxysms.  On Eliquis , currently on hold given hematuria, which is resolving   Complete heart block: Status post permanent pacemaker with normal function.  ICD interrogation 04/02/2024 showed battery at 4 months.  Discussed referral to EP states he will follow-up with his EP doctor at [facility]   For questions or updates, please contact [facility] Please consult [email or web address] for contact info under Cardiology/STEMI.   Signed, Lonni LITTIE Nanas, MD  04/16/2024, 8:26 AM

## 2024-04-16 NOTE — TOC Progression Note (Signed)
 Transition of Care [facility]) - Progression Note    Patient Details  Name: Tim [name] MRN: [id number] Date of Birth: Feb 12, 1941  [facility]) CM/SW Contact  Sherline Clack, [place] Phone Number: 04/16/2024, 2:04 PM  Clinical Narrative:     CSW presented SNF list with Medicare ratings to patient and wife at bedside. Wife told CSW she would like to tour some of the facilities before choosing, due to a bad experience at [facility]. CSW left phone number to contact once patient and wife have made a choice. CSW will check in tomorrow for family's decision. Patient and wife informed that insurance authorization can only be started after they have made a facility choice. CSW will continue to follow.                     Expected Discharge Plan and Services                                               Social Drivers of Health (SDOH) Interventions SDOH Screenings   Food Insecurity: No Food Insecurity (04/14/2024)  Housing: Low Risk  (04/14/2024)  Transportation Needs: No Transportation Needs (04/14/2024)  Utilities: Not At Risk (04/14/2024)  Depression (PHQ2-9): Low Risk  (03/31/2024)  Financial Resource Strain: Low Risk  (09/23/2023)  Physical Activity: Unknown (09/23/2023)  Social Connections: Moderately Isolated (04/14/2024)  Stress: No Stress Concern Present (03/21/2024)   Received from [facility]  Tobacco Use: Low Risk  (04/10/2024)    Readmission Risk Interventions    04/15/2024    3:20 PM 04/14/2024    4:33 PM  Readmission Risk Prevention Plan  Transportation Screening Complete Complete  PCP or Specialist Appt within 5-7 Days Complete Complete  Home Care Screening Complete Complete  Medication Review (RN CM) Complete Referral to Pharmacy

## 2024-04-16 NOTE — Progress Notes (Signed)
 Mobility Specialist: Progress Note   04/16/24 1556  Mobility  Activity Ambulated with assistance  Level of Assistance +2 (takes two people)  Assistive Device Front wheel walker  Activity Response Tolerated well  Mobility Referral Yes  Mobility visit 1 Mobility  Mobility Specialist Start Time (ACUTE ONLY) 1539  Mobility Specialist Stop Time (ACUTE ONLY) 1551  Mobility Specialist Time Calculation (min) (ACUTE ONLY) 12 min    Pt received in chair, requesting assistance back to bed. MinA+2 for STS and stand pivot. C/o bottom/scrotum pain. ModA+2 for sit>supine. Left in bed with all needs met, call bell in reach. Wife present.   Ileana Lute Mobility Specialist Please contact via SecureChat or Rehab office at [phone]

## 2024-04-16 NOTE — Progress Notes (Signed)
 Physical Therapy Treatment Patient Details Name: Tim [name] MRN: [id number] DOB: 11/23/1940 Today's Date: 04/16/2024   History of Present Illness Pt is an 83 y/o male presenting with AMS and hypotension requiring ICU admission and pressor in setting of shock. Pending TEE 10/14. PMH: TAVR with hx of endocarditis, mitral valve lesion, AKI, CAD, PAF, CAD s/p PCI, complete heart block s/p PPM, HTN, a fib, DM, OSA    PT Comments  Patient progressing to in room ambulation this session with 2 A for safety.  Patient needing to sit quickly with arm fatigue.  Also attempted second stand though felt unstable and fearful so returned to sitting and declined further ambulation.  PT will continue to follow, still appropriate for inpatient rehab (<3 hours/day) at d/c.     If plan is discharge home, recommend the following: A lot of help with bathing/dressing/bathroom;A lot of help with walking and/or transfers;Assist for transportation;Assistance with cooking/housework   Can travel by private vehicle     No  Equipment Recommendations  None recommended by PT    Recommendations for Other Services       Precautions / Restrictions Precautions Precautions: Fall Recall of Precautions/Restrictions: Intact     Mobility  Bed Mobility Overal bed mobility: Needs Assistance Bed Mobility: Supine to Sit     Supine to sit: HOB elevated, Used rails, Min assist, +2 for safety/equipment     General bed mobility comments: lifted shoulders using rail and able to move legs to EOB, assist for balance and scooting    Transfers Overall transfer level: Needs assistance Equipment used: Rolling walker (2 wheels) Transfers: Sit to/from Stand Sit to Stand: From elevated surface, Min assist, +2 safety/equipment           General transfer comment: cues for hand placement, assist for balance, anterior weight shift; when standing from recliner fearful due to anterior bias/LOB with min A +2 for safety     Ambulation/Gait Ambulation/Gait assistance: Min assist, +2 safety/equipment Gait Distance (Feet): 10 Feet Assistive device: Rolling walker (2 wheels) Gait Pattern/deviations: Step-to pattern, Step-through pattern, Decreased stride length, Trunk flexed, Wide base of support       General Gait Details: forward to door with chair following, assist for balance, cues for posture, +2 for chair follow for safety, needing to sit with c/o arm fatigue   Stairs             Wheelchair Mobility     Tilt Bed    Modified Rankin (Stroke Patients Only)       Balance Overall balance assessment: Needs assistance Sitting-balance support: Feet supported Sitting balance-Leahy Scale: Fair     Standing balance support: Bilateral upper extremity supported Standing balance-Leahy Scale: Poor Standing balance comment: UE support and min A for balance                            Communication Communication Communication: Impaired Factors Affecting Communication: Hearing impaired  Cognition Arousal: Alert Behavior During Therapy: WFL for tasks assessed/performed                             Following commands: Intact      Cueing Cueing Techniques: Verbal cues, Gestural cues, Tactile cues  Exercises General Exercises - Lower Extremity Ankle Circles/Pumps: AROM, Both, 10 reps, Supine    General Comments General comments (skin integrity, edema, etc.): wife present and remains supportive, VSS  Pertinent Vitals/Pain Pain Assessment Pain Assessment: Faces Faces Pain Scale: Hurts little more Pain 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, bottom scooting Pain Descriptors / Indicators: Grimacing, Guarding, Sore Pain Intervention(s): Monitored during session, Repositioned, Limited activity within patient's tolerance    Home Living                          Prior Function            PT Goals (current goals can now be found in the care plan section)  Progress towards PT goals: Progressing toward goals    Frequency    Min 2X/week      PT Plan      Co-evaluation              AM-PAC PT 6 Clicks Mobility   Outcome Measure  Help needed turning from your back to your side while in a flat bed without using bedrails?: A Lot Help needed moving from lying on your back to sitting on the side of a flat bed without using bedrails?: A Lot Help needed moving to and from a bed to a chair (including a wheelchair)?: Total Help needed standing up from a chair using your arms (e.g., wheelchair or bedside chair)?: A Lot Help needed to walk in hospital room?: Total Help needed climbing 3-5 steps with a railing? : Total 6 Click Score: 9    End of Session Equipment Utilized During Treatment: Gait belt Activity Tolerance: Patient limited by fatigue Patient left: in chair;with call bell/phone within reach;with chair alarm set;with family/visitor present Nurse Communication: Mobility status PT Visit Diagnosis: Other abnormalities of gait and mobility (R26.89);Muscle weakness (generalized) (M62.81)     Time: 8599-8574 PT Time Calculation (min) (ACUTE ONLY): 25 min  Charges:    $Gait Training: 8-22 mins $Therapeutic Activity: 8-22 mins PT General Charges $$ ACUTE PT VISIT: 1 Visit                     Micheline Portal, PT Acute Rehabilitation Services Office:[phone] 04/16/2024    [name] 04/16/2024, 2:43 PM

## 2024-04-16 NOTE — Progress Notes (Signed)
 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  He was taking extra gabapentin  and extra oxycodone .  Has decreased oral intake.  Patient got IV fluid resuscitation, admitted to ICU for hypotension, AKI requiring vasopressors.  04/11/2024 echocardiogram showed possible vegetation versus calcium  on anterior mitral leaflet.  Cardiology and ID consulted.  Patient's blood pressure improved off pressors, transferred to TRH service 04/14/2024.  Assessment and Plan: Hypovolemic shock-resolved Possible septic shock- Patient is off pressors now. Low blood pressures could be from his pain meds, muscle relaxants, gabapentin  which he is taking extra doses. COVID/ flu negative. Blood cultures no growth to date. UA unremarkable, MRSA screen negative. DVT study negative noted. Echocardiogram possible mitral leaflet vegetation. 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 --Treated with IV antibiotics Rocephin  and Zyvox   --Resumed on suppressive Amoxicillin  1gm bid (home dose) as per ID. --PT/OT advised SNF/rehab --TOC working on placement  History of CAD s/p PCI. 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 Status post ICD with CRT in 2019. Patient had left heart cath September 2025, showed 95% ostial diagonal stenosis, small vessel and medical management recommended. Repeat echocardiogram showed possible vegetation versus calcification on anterior mitral leaflet. 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 --Cardiology advised structural heart eval for possible severe aortic stenosis. --Off Eliquis  due to hematuria on presentation, this seems improving.   Paroxysmal Atrial fibrillation: HR's controlled in 70's to 90 Eliquis  on hold due to hematuria. Hold Coreg due to low blood pressures.  Acute kidney injury-prerenal In the setting of shock. Urine output gradually improving. Continue to monitor daily electrolytes, renal function. Avoid nephrotoxic drugs.  BPH- Hematuria - improving. Continue Proscar .  Urology follow-up appreciated.  Patient need outpatient evaluation with hematuria protocol CT scan and cystoscopy per urology.  Hypokalemia- Oral potassium supplementation as needed.  Chronic pain syndrome- Patient is on oxycodone , and gabapentin  therapy at home. Caution with pain medications due to low blood pressures. He is at risk for polypharmacy.   Gout-continue to hold allopurinol  due to AKI.  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 Diet, exercise and weight reduction advised.      Out of bed to chair. Incentive spirometry. Nursing supportive care. Fall, aspiration precautions. Diet:  Diet Orders (From admission, onward)     Start     Ordered   04/15/24 1300  Diet Heart Room service appropriate? Yes; Fluid consistency: Thin  Diet effective now       Question Answer Comment  Room service appropriate? Yes   Fluid consistency: Thin      04/15/24 1259           DVT prophylaxis:   Level of care: Telemetry Medical   Code Status: Limited: Do not attempt resuscitation (DNR) -DNR-LIMITED -Do Not Intubate/DNI   Subjective: Pt seen with wife at bedside today. He reports feeling okay, but having diarrhea.  Asks for something to help with that.  Denies abdominal pain or nausea/vomiting.  Otherwise reports being very weak last attempt with PT, hopes to work  with them again today.  He has not  really been out of the bed.      Physical Exam: Vitals:   04/15/24 2043 04/16/24 0020 04/16/24 0712 04/16/24 0836  BP: (!) 107/51 (!) 101/51  (!) 97/45  Pulse: 84 86  72  Resp:  18  18  Temp: 99.7 F (37.6 C) 98.4 F (36.9 C)  98.3 F (36.8 C)  TempSrc: Oral Oral    SpO2: 98% 97%  95%  Weight:   115.5 kg   Height:        General exam: awake, alert, no acute distress HEENT: moist mucus membranes, hearing grossly normal  Respiratory system: CTAB, no wheezes, rales or rhonchi, normal respiratory effort. Cardiovascular system: normal S1/S2, RRR, 2/6 systolic murmur  Gastrointestinal system: soft, NT, ND, +bowel sounds. Central nervous system: A&O x 3. no gross focal neurologic deficits, normal speech Skin: dry, intact, normal temperature Psychiatry: normal mood, congruent affect, judgement and insight appear normal   Data Reviewed:      Latest Ref Rng & Units 04/16/2024    1:31 AM 04/15/2024    3:30 AM 04/13/2024    2:02 AM  CBC  WBC 4.0 - 10.5 K/uL 5.8  5.9  7.3   Hemoglobin 13.0 - 17.0 g/dL 8.1  8.9  9.6   Hematocrit 39.0 - 52.0 % 23.4  25.6  27.3   Platelets 150 - 400 K/uL 77  76  105       Latest Ref Rng & Units 04/16/2024    1:31 AM 04/15/2024    3:30 AM 04/14/2024    4:52 AM  BMP  Glucose 70 - 99 mg/dL 894  888  886   BUN 8 - 23 mg/dL 6  5  12    Creatinine 0.61 - 1.24 mg/dL 9.30  9.31  9.15   Sodium 135 - 145 mmol/L 136  137  138   Potassium 3.5 - 5.1 mmol/L 3.6  3.6  3.4   Chloride 98 - 111 mmol/L 103  103  103   CO2 22 - 32 mmol/L 24  24  25    Calcium  8.9 - 10.3 mg/dL 8.2  8.4  8.7    ECHO TEE Result Date: 04/15/2024    TRANSESOPHOGEAL ECHO REPORT   Patient Name:   Tim [name] Date of Exam: 04/15/2024 Medical Rec #:  [id number]       Height:       76.0 in Accession #:    [id number]      Weight:       259.5 lb Date of Birth:  Dec 17, 1940       BSA:          2.474 m Patient Age:    83 years        BP:           121/51 mmHg Patient Gender: M                HR:           79 bpm. Exam Location:  Inpatient Procedure: Transesophageal Echo, Color Doppler, Cardiac Doppler and 3D Echo            (Both Spectral and Color Flow Doppler were utilized during            procedure). Indications:     Aortic Stenosis  History:         Patient has prior history of Echocardiogram examinations, most  recent 04/10/2024. CHF, Previous Myocardial Infarction and CAD,                  Defibrillator and Pacemaker, Pulmonary HTN and COPD, s/p AVR,                  29 mm Edwards Sapien prosthetic, stented (TAVR) valve present                  in the aortic position., Aortic Valve Disease and Mitral Valve                  Disease; Risk Factors:Sleep Apnea and Hypertension.                  Aortic Valve: 29 mm Sapien prosthetic, stented (TAVR) valve is                  present in the aortic position.  Sonographer:     Koleen Popper RDCS Referring Phys:  [id number] WADDELL A PARCELLS Diagnosing Phys: Vinie Maxcy MD PROCEDURE: After discussion of the risks and benefits of a TEE, an informed consent was obtained from the patient. The transesophogeal probe was passed without difficulty through the esophogus of the patient. Imaged were obtained with the patient in a supine position. Sedation performed by different physician. The patient was monitored while under deep sedation. Anesthestetic sedation was provided intravenously by Anesthesiology: 220mg  of Propofol , 60mg  of Lidocaine . The patient developed no complications during the procedure.  IMPRESSIONS  1. Left ventricular ejection fraction, by estimation, is 65 to 70%. The left ventricle has normal function. There is mild left ventricular hypertrophy.  2. Right ventricular systolic function is normal. The right ventricular size is normal.  3. Calcified small mobile mass on the anterior leaflet, likely old vegetation. The mitral valve is degenerative. Mild mitral valve regurgitation.  4. The aortic valve has been repaired/replaced.  There is a 29 mm Sapien prosthetic (TAVR) valve present in the aortic position. Echo findings are consistent with stenosis of the aortic prosthesis. EOA, by VTI measures 0.55 cm. Aortic valve mean gradient measures 40.0 mmHg. Aortic valve Vmax measures 4.13 m/s. Peak gradient 68.2 mmHg, DI 0.20.  5. 3D performed of the aortic valve and demonstrates 3D evaluation of the aortic valve prosthesis demonstrates severe prosthetic aortic valve stenosis.  6. Left atrial size was moderately dilated. No left atrial/left atrial appendage thrombus was detected. Conclusion(s)/Recommendation(s): No evidence of vegetation/infective endocarditis on this transesophageael echocardiogram. Severe stenosis of the aortic TAVR valve- consider structural heart team evaluation for possible valve-in-valve procedure if there are not other contraindications to the procedure. FINDINGS  Left Ventricle: Left ventricular ejection fraction, by estimation, is 65 to 70%. The left ventricle has normal function. The left ventricular internal cavity size was normal in size. There is mild left ventricular hypertrophy. Right Ventricle: The right ventricular size is normal. No increase in right ventricular wall thickness. Right ventricular systolic function is normal. Left Atrium: Left atrial size was moderately dilated. No left atrial/left atrial appendage thrombus was detected. Right Atrium: Right atrial size was normal in size. Pericardium: There is no evidence of pericardial effusion. Mitral Valve: Calcified small mobile mass on the anterior leaflet, likely old vegetation. The mitral valve is degenerative in appearance. Mild to moderate mitral annular calcification. Mild mitral valve regurgitation. Tricuspid Valve: The tricuspid valve is grossly normal. Tricuspid valve regurgitation is trivial. Aortic Valve: The aortic valve has been repaired/replaced. Aortic valve regurgitation is not visualized. Severe aortic stenosis  is present. Aortic valve mean  gradient measures 40.0 mmHg. Aortic valve peak gradient measures 68.2 mmHg. Aortic valve area, by VTI measures 0.55 cm. There is a 29 mm Sapien prosthetic, stented (TAVR) valve present in the aortic position. Pulmonic Valve: The pulmonic valve was grossly normal. Pulmonic valve regurgitation is not visualized. Aorta: The aortic root and ascending aorta are structurally normal, with no evidence of dilitation. IAS/Shunts: No atrial level shunt detected by color flow Doppler. Additional Comments: 3D was performed not requiring image post processing on an independent workstation and was abnormal. Spectral Doppler performed. LEFT VENTRICLE PLAX 2D LVOT diam:     1.90 cm LV SV:         46 LV SV Index:   19 LVOT Area:     2.84 cm  AORTIC VALVE AV Area (Vmax):    0.74 cm AV Area (Vmean):   0.77 cm AV Area (VTI):     0.55 cm AV Vmax:           413.00 cm/s AV Vmean:          296.000 cm/s AV VTI:            0.834 m AV Peak Grad:      68.2 mmHg AV Mean Grad:      40.0 mmHg LVOT Vmax:         108.00 cm/s LVOT Vmean:        80.400 cm/s LVOT VTI:          0.163 m LVOT/AV VTI ratio: 0.20  AORTA Ao Asc diam: 3.50 cm  SHUNTS Systemic VTI:  0.16 m Systemic Diam: 1.90 cm Vinie Maxcy MD Electronically signed by Vinie Maxcy MD Signature Date/Time: 04/15/2024/3:20:08 PM    Final    EP STUDY Result Date: 04/15/2024 See surgical note for result.   Family Communication: wife at bedside on rounds today 10/15.  Disposition: Status is: Inpatient Remains inpatient appropriate because:monitor h/h, hematuria, awaiting SNF placement   Planned Discharge Destination: Rehab     Time spent: 45 minutes  Author: Burnard DELENA Cunning, DO 04/16/2024 3:55 PM Secure chat 7am to 7pm For on call review [email or web address].

## 2024-04-17 ENCOUNTER — Inpatient Hospital Stay (HOSPITAL_COMMUNITY)

## 2024-04-17 DIAGNOSIS — T82857D Stenosis of cardiac prosthetic devices, implants and grafts, subsequent encounter: Secondary | ICD-10-CM | POA: Diagnosis not present

## 2024-04-17 DIAGNOSIS — I480 Paroxysmal atrial fibrillation: Secondary | ICD-10-CM | POA: Diagnosis not present

## 2024-04-17 DIAGNOSIS — R579 Shock, unspecified: Secondary | ICD-10-CM | POA: Diagnosis not present

## 2024-04-17 LAB — GLUCOSE, CAPILLARY
Glucose-Capillary: 121 mg/dL — ABNORMAL HIGH (ref 70–99)
Glucose-Capillary: 149 mg/dL — ABNORMAL HIGH (ref 70–99)
Glucose-Capillary: 122 mg/dL — ABNORMAL HIGH (ref 70–99)
Glucose-Capillary: 116 mg/dL — ABNORMAL HIGH (ref 70–99)

## 2024-04-17 LAB — CULTURE, BLOOD (ROUTINE X 2)
Culture  Setup Time: NO GROWTH
Special Requests: ADEQUATE

## 2024-04-17 LAB — CBC
HCT: 23.4 % — ABNORMAL LOW (ref 39.0–52.0)
Hemoglobin: 8.2 g/dL — ABNORMAL LOW (ref 13.0–17.0)
MCH: 33.5 pg (ref 26.0–34.0)
MCHC: 35 g/dL (ref 30.0–36.0)
MCV: 95.5 fL (ref 80.0–100.0)
Platelets: 65 K/uL — ABNORMAL LOW (ref 150–400)
RBC: 2.45 MIL/uL — ABNORMAL LOW (ref 4.22–5.81)
RDW: 13.7 % (ref 11.5–15.5)
WBC: 4.9 K/uL (ref 4.0–10.5)
nRBC: 0 % (ref 0.0–0.2)

## 2024-04-17 MED ORDER — OXYCODONE HCL 5 MG PO TABS
10.0000 mg | ORAL_TABLET | Freq: Every day | ORAL | Status: AC
Start: 2024-04-17 — End: ?
  Administered 2024-04-18: 10 mg via ORAL
  Filled 2024-04-17: qty 2

## 2024-04-17 MED ORDER — IOHEXOL 350 MG/ML SOLN
100.0000 mL | Freq: Once | INTRAVENOUS | Status: AC | PRN
Start: 2024-04-17 — End: 2024-04-17
  Administered 2024-04-17: 100 mL via INTRAVENOUS

## 2024-04-17 MED ORDER — GABAPENTIN 400 MG PO CAPS
400.0000 mg | ORAL_CAPSULE | Freq: Three times a day (TID) | ORAL | Status: DC
  Administered 2024-04-17 – 2024-04-18 (×3): 400 mg via ORAL
  Filled 2024-04-17 (×4): qty 1

## 2024-04-17 NOTE — Progress Notes (Addendum)
 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  He was taking extra gabapentin  and extra oxycodone .  Has decreased oral intake.  Patient got IV fluid resuscitation, admitted to ICU for hypotension, AKI requiring vasopressors.  04/11/2024 echocardiogram showed possible vegetation versus calcium  on anterior mitral leaflet.  Cardiology and ID consulted.  Patient's blood pressure improved off pressors, transferred to TRH service 04/14/2024.  Assessment and Plan: Hypovolemic shock-resolved Sepsis / septic shock ruled out Patient is off pressors now. Low blood pressures could be from his pain meds, muscle relaxants, gabapentin  which he is taking extra doses. COVID/ flu negative. Blood cultures no growth to date. UA unremarkable, MRSA screen negative. DVT study negative noted. Echocardiogram possible mitral leaflet vegetation. 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 --Treated with IV antibiotics Rocephin  and Zyvox   --Resumed on suppressive Amoxicillin  1gm bid (home dose) as per ID. --PT/OT advised SNF/rehab --TOC working on placement  History of CAD s/p PCI. 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 Status post ICD with CRT in 2019. Patient had left heart cath September 2025, showed 95% ostial diagonal stenosis, small vessel and medical management recommended. Repeat echocardiogram showed possible vegetation versus calcification on anterior mitral leaflet. 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 --Cardiology consulted - have signed off  --Cardiology advised structural heart eval for possible severe aortic stenosis.  This will be at [facility] with Dr. Shlomo 10/23, as pt follows there. --Off Eliquis  due to hematuria on presentation, this seems improving.  --Continue ASA while off Eliquis , stop ASA when Eliquis  is resumed  Toxic/metabolic encephalopathy -- attributed to polypharmacy.  Sedating medications were held or reduced considerably.  Mentation at baseline. --Minimize polypharmacy, but pt does require pain control --Delirium precautions  Paroxysmal Atrial fibrillation: HR's controlled in 70's to 90 Eliquis  on hold due to hematuria. Hold Coreg due to low blood pressures.  Acute kidney injury-prerenal In the setting of shock. Urine output gradually improving. Continue to monitor daily electrolytes, renal function. Avoid nephrotoxic drugs.  BPH- Hematuria - improving. Continue Proscar .   Urology follow-up appreciated.   10/16 -- CT urogram ordered per urology -- pending Continue holding Eliquis  until okay with urology to resume.  Remainder of evaluation for hematuria will be as outpatient.  Hypokalemia- Oral potassium supplementation as needed.  Chronic pain syndrome- Patient is on oxycodone , and gabapentin  therapy at home. At risk for polypharmacy.  10/16 -- pt not sleeping due to untreated pain since admission.   --Gabapentin  resumed at lower dose, increase slightly from 300 >> 400 mg (on 800 mg at home) --Oxycodone  PRN --Will add nighttime oxycodone  10 mg (takes 20 mg nightly at home) - to start tonight  Gout-continue to hold allopurinol  due to AKI.  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 Diet, exercise and weight reduction advised.      Out of bed to chair. Incentive spirometry. Nursing supportive care. Fall, aspiration precautions. Diet:  Diet Orders (From admission, onward)      Start     Ordered  04/15/24 1300  Diet Heart Room service appropriate? Yes; Fluid consistency: Thin  Diet effective now       Question Answer Comment  Room service appropriate? Yes   Fluid consistency: Thin      04/15/24 1259           DVT prophylaxis:   Level of care: Telemetry Medical   Code Status: Limited: Do not attempt resuscitation (DNR) -DNR-LIMITED -Do Not Intubate/DNI   Subjective: Pt seen with wife at bedside today. He reports great difficulty with pain at night and not getting rest.  He reports taking his home oxycodone  at night, this has been held since admission and other meds reduced.  He reports very poor appetite, in part due to changes in taste - everything tastes bad, long term problem slowly improving, but some food served here are things he's not used to.  Had more diarrhea this AM, improved after imodium yesterday.  Typically struggles with constipation from pain meds.     Physical Exam: Vitals:   04/16/24 1623 04/16/24 2128 04/17/24 0000 04/17/24 0844  BP: (!) 135/51 (!) 120/58 110/63 112/64  Pulse: 80 91 93 84  Resp: 19  18 18   Temp: 98.9 F (37.2 C) 99 F (37.2 C) 98.7 F (37.1 C)   TempSrc:  Oral Oral   SpO2: 100% 99% 100% 98%  Weight:      Height:        General exam: awake, alert, no acute distress HEENT: moist mucus membranes, hearing grossly normal  Respiratory system: CTAB, no wheezes, rales or rhonchi, normal respiratory effort. Cardiovascular system: normal S1/S2, RRR, 2/6 systolic murmur, trace edema Gastrointestinal system: soft, NT, ND, +bowel sounds. Central nervous system: A&O x 3. no gross focal neurologic deficits, normal speech Skin: dry, intact, normal temperature Psychiatry: normal mood, congruent affect, judgement and insight appear normal   Data Reviewed:      Latest Ref Rng & Units 04/17/2024   11:13 AM 04/16/2024    1:31 AM 04/15/2024    3:30 AM  CBC  WBC 4.0 - 10.5 K/uL 4.9  5.8  5.9   Hemoglobin 13.0 -  17.0 g/dL 8.2  8.1  8.9   Hematocrit 39.0 - 52.0 % 23.4  23.4  25.6   Platelets 150 - 400 K/uL 65  77  76       Latest Ref Rng & Units 04/16/2024    1:31 AM 04/15/2024    3:30 AM 04/14/2024    4:52 AM  BMP  Glucose 70 - 99 mg/dL 894  888  886   BUN 8 - 23 mg/dL 6  5  12    Creatinine 0.61 - 1.24 mg/dL 9.30  9.31  9.15   Sodium 135 - 145 mmol/L 136  137  138   Potassium 3.5 - 5.1 mmol/L 3.6  3.6  3.4   Chloride 98 - 111 mmol/L 103  103  103   CO2 22 - 32 mmol/L 24  24  25    Calcium  8.9 - 10.3 mg/dL 8.2  8.4  8.7    No results found.   Family Communication: wife at bedside on rounds today 10/16.  Disposition: Status is: Inpatient Remains inpatient appropriate because: awaiting SNF placement, monitoring anemia closely    Planned Discharge Destination: Rehab     Time spent: 42 minutes  Author: Burnard DELENA Cunning, DO 04/17/2024 12:40 PM Secure chat 7am to 7pm For on call review [email or web address].

## 2024-04-17 NOTE — Progress Notes (Signed)
   04/17/24 2103  BiPAP/CPAP/SIPAP  BiPAP/CPAP/SIPAP Pt Type Adult  Reason BIPAP/CPAP not in use Non-compliant  BiPAP/CPAP /SiPAP Vitals  Pulse Rate 93  Resp 18  SpO2 96 %  Bilateral Breath Sounds Clear

## 2024-04-17 NOTE — Congregational Nurse Program (Addendum)
 All wound dressings change. Pt refused bed change peri care. Then agreed to change gown and pad.

## 2024-04-17 NOTE — Progress Notes (Signed)
 Physical Therapy Treatment Patient Details Name: Tim [name] MRN: [id number] DOB: Oct 08, 1940 Today's Date: 04/17/2024   History of Present Illness Pt is an 83 y/o male presenting 10/9 with AMS and hypotension requiring ICU admission and pressor in setting of shock. Pending TEE 10/14. PMH: TAVR with hx of endocarditis, mitral valve lesion, AKI, CAD, PAF, CAD s/p PCI, complete heart block s/p PPM, HTN, a fib, DM, OSA    PT Comments  The pt is making good functional progress, now only needing supervision to transition supine to sit EOB with HOB elevated and no use of rails, simulating exiting his adjustable bed at home. He was also able to transfer to stand and ambulate up to ~10 ft with a RW at a CGA level. However, he continues to fatigue quickly, requiring a close chair follow to sit quickly as needed when ambulating. He remains at high risk for falls. Educated pt on his risk for falls and that if he decides to d/c home that he would need his wife to guard him and have a chair follow closely behind him with all standing mobility. He verbalized understanding. Based on his hx of several falls this year and his noted deficits in balance, strength, and endurance, he could benefit from short-term inpatient rehab, < 3 hours/day. Based on this session, it is possible he could d/c home with his wife providing him with assistance though if he decides he would rather go home with HHPT instead. Will continue to follow acutely.     If plan is discharge home, recommend the following: A lot of help with bathing/dressing/bathroom;Assist for transportation;Assistance with cooking/housework;A little help with walking and/or transfers   Can travel by private vehicle     Yes  Equipment Recommendations  Other (comment) (roho cushion for w/c)    Recommendations for Other Services       Precautions / Restrictions Precautions Precautions: Fall Recall of Precautions/Restrictions: Intact Precaution/Restrictions  Comments: watch BP Restrictions Weight Bearing Restrictions Per Provider Order: No     Mobility  Bed Mobility Overal bed mobility: Needs Assistance Bed Mobility: Supine to Sit     Supine to sit: Supervision, HOB elevated     General bed mobility comments: Extra time with pt pulling on edge of mattress and sheets to lift trunk and pivot body to sit R EOB from elevated HOB, no use of rails to simulate bed mobility in his adjustable bed at home, supervision for safety    Transfers Overall transfer level: Needs assistance Equipment used: Rolling walker (2 wheels) Transfers: Sit to/from Stand Sit to Stand: Contact guard assist           General transfer comment: Pt pushes up from his seat and gradually extends his legs before transitioning one hand at a time to the RW, then extending his trunk. Pt maintains a kyphotic posture. Extra time, but no LOB, CGA for safety    Ambulation/Gait Ambulation/Gait assistance: Contact guard assist, +2 safety/equipment Gait Distance (Feet): 10 Feet (x2 bouts of ~10 ft > ~2 ft) Assistive device: Rolling walker (2 wheels) Gait Pattern/deviations: Step-through pattern, Decreased stride length, Trunk flexed, Wide base of support, Shuffle Gait velocity: reduced Gait velocity interpretation: <1.31 ft/sec, indicative of household ambulator   General Gait Details: Pt takes slow, small steps with a flexed posture. Cues needed for breathing control to reduce anxiety and try to progress distance. Pt needed close chair follow for safety as he fatigues quickly and needs to sit. CGA + 2 for safety  Stairs             Wheelchair Mobility     Tilt Bed    Modified Rankin (Stroke Patients Only)       Balance Overall balance assessment: Needs assistance Sitting-balance support: Feet supported, No upper extremity supported Sitting balance-Leahy Scale: Fair Sitting balance - Comments: supervision sitting statically EOB   Standing balance  support: Bilateral upper extremity supported, During functional activity, Reliant on assistive device for balance Standing balance-Leahy Scale: Poor Standing balance comment: reliant on RW                            Communication Communication Communication: Impaired Factors Affecting Communication: Hearing impaired  Cognition Arousal: Alert Behavior During Therapy: WFL for tasks assessed/performed, Anxious   PT - Cognitive impairments: No apparent impairments                       PT - Cognition Comments: Pt reporting some mild anxiety with standing mobility. HOH, needs extra time to process cues/education Following commands: Intact      Cueing Cueing Techniques: Verbal cues, Tactile cues  Exercises      General Comments General comments (skin integrity, edema, etc.): Educated pt on his risk for falls and that if he decides to d/c home that he would need his wife to guard him and have a chair follow closely behind him with all standing mobility. Educated pt on different breathing exercises, like pursed lip breathing. He verbalized understanding.      Pertinent Vitals/Pain Pain Assessment Pain Assessment: Faces Faces Pain Scale: Hurts little more Pain Location: back Pain Descriptors / Indicators: Discomfort, Grimacing, Guarding Pain Intervention(s): Limited activity within patient's tolerance, Monitored during session, Repositioned    Home Living                          Prior Function            PT Goals (current goals can now be found in the care plan section) Acute Rehab PT Goals Patient Stated Goal: potentially return home PT Goal Formulation: With patient Time For Goal Achievement: 04/29/24 Potential to Achieve Goals: Fair Progress towards PT goals: Progressing toward goals    Frequency    Min 2X/week      PT Plan      Co-evaluation              AM-PAC PT 6 Clicks Mobility   Outcome Measure  Help needed  turning from your back to your side while in a flat bed without using bedrails?: A Little Help needed moving from lying on your back to sitting on the side of a flat bed without using bedrails?: A Little Help needed moving to and from a bed to a chair (including a wheelchair)?: A Little Help needed standing up from a chair using your arms (e.g., wheelchair or bedside chair)?: A Little Help needed to walk in hospital room?: Total (<20 ft) Help needed climbing 3-5 steps with a railing? : Total 6 Click Score: 14    End of Session Equipment Utilized During Treatment: Gait belt Activity Tolerance: Patient tolerated treatment well;Patient limited by fatigue Patient left: in chair;with call bell/phone within reach;with chair alarm set   PT Visit Diagnosis: Other abnormalities of gait and mobility (R26.89);Muscle weakness (generalized) (M62.81);Unsteadiness on feet (R26.81);Difficulty in walking, not elsewhere classified (R26.2)     Time: 8791-8765  PT Time Calculation (min) (ACUTE ONLY): 26 min  Charges:    $Gait Training: 8-22 mins $Therapeutic Activity: 8-22 mins PT General Charges $$ ACUTE PT VISIT: 1 Visit                     Theo Ferretti, PT, DPT Acute Rehabilitation Services  Office: [phone]    Theo CHRISTELLA Ferretti 04/17/2024, 5:56 PM

## 2024-04-17 NOTE — Progress Notes (Signed)
 1445 Pt was yelling out Help upon entering the room pt hair was noted on the floor. When Pt was asked what happened Pt stated I cut it my hair Pt was up in chair. Pt had scissors on the left side of the bed on the floor. Scissors removed from pt bedside and charge was notified. 1556 Wife [name] was notified of the incident. Wife called Pt upon ending phone call with the nurse. Pt in bed with 3/4 side rails raised, bed alarm on, call bell within reach. Pt advised nothing is needed at this time.

## 2024-04-17 NOTE — Plan of Care (Signed)

## 2024-04-17 NOTE — Plan of Care (Signed)

## 2024-04-17 NOTE — TOC Progression Note (Signed)
 Transition of Care [facility]) - Progression Note    Patient Details  Name: Tim [name] MRN: [id number] Date of Birth: 06/08/1941  Transition of [facility]) CM/SW Contact  Sherline Clack, [place] Phone Number: 04/17/2024, 10:13 AM  Clinical Narrative:     CSW spoke with patient and wife at bedside regarding SNF choice. Patient's wife identified [facility], [facility], and [facility] as facilities of interest, but requested CSW let PT know the patient would like to be evaluated for home. Patient was previously connected with [facility] and is interested in reconnecting with them if able to go home. CSW will continue to follow and update discharge plan.    Expected Discharge Plan: Skilled Nursing Facility Barriers to Discharge: Continued Medical Work up, English as a second language teacher, SNF Pending bed offer               Expected Discharge Plan and Services       Living arrangements for the past 2 months: Single Family Home                                       Social Drivers of Health (SDOH) Interventions SDOH Screenings   Food Insecurity: No Food Insecurity (04/14/2024)  Housing: Low Risk  (04/14/2024)  Transportation Needs: No Transportation Needs (04/14/2024)  Utilities: Not At Risk (04/14/2024)  Depression (PHQ2-9): Low Risk  (03/31/2024)  Financial Resource Strain: Low Risk  (09/23/2023)  Physical Activity: Unknown (09/23/2023)  Social Connections: Moderately Isolated (04/14/2024)  Stress: No Stress Concern Present (03/21/2024)   Received from [facility]  Tobacco Use: Low Risk  (04/10/2024)    Readmission Risk Interventions    04/15/2024    3:20 PM 04/14/2024    4:33 PM  Readmission Risk Prevention Plan  Transportation Screening Complete Complete  PCP or Specialist Appt within 5-7 Days Complete Complete  Home Care Screening Complete Complete  Medication Review (RN CM) Complete Referral to Pharmacy

## 2024-04-17 NOTE — Progress Notes (Signed)
 Mobility Specialist: Progress Note   04/17/24 1627  Mobility  Activity Pivoted/transferred from bed to chair  Level of Assistance +2 (takes two people)  Press photographer wheel walker  Activity Response Tolerated well  Mobility Referral Yes  Mobility visit 1 Mobility  Mobility Specialist Start Time (ACUTE ONLY) 1540  Mobility Specialist Stop Time (ACUTE ONLY) 1550  Mobility Specialist Time Calculation (min) (ACUTE ONLY) 10 min    Pt received in chair, confused and requesting help from rehab to get back to bed. Anxious of mobility and not allowing nursing staff to assist him. Educated pt on the proper way to stand and transfer with RW. MinA+2 for STS from chair. MinA for stand pivot to EOB. Left on EOB with nursing staff, all needs met.   Ileana Lute Mobility Specialist Please contact via SecureChat or Rehab office at [phone]

## 2024-04-17 NOTE — Progress Notes (Signed)
 Progress Note  Patient Name: Tim [name] Date of Encounter: 04/17/2024  Primary Cardiologist:   Redell Shallow, MD   Subjective   BP 112/64.  Denies any chest pain or dyspnea.    Inpatient Medications    Scheduled Meds:  amoxicillin   1,000 mg Oral Q12H   aspirin  EC  81 mg Oral Daily   Chlorhexidine  Gluconate Cloth  6 each Topical Daily   feeding supplement  1 Container Oral TID BM   finasteride   5 mg Oral Daily   gabapentin   300 mg Oral TID   insulin  aspart  0-5 Units Subcutaneous QHS   insulin  aspart  0-9 Units Subcutaneous TID WC   lidocaine   1 patch Transdermal Q24H   melatonin  3 mg Oral QHS   nystatin    Topical BID   Continuous Infusions:   PRN Meds: acetaminophen , alum & mag hydroxide-simeth, docusate sodium , loperamide, nitroGLYCERIN , mouth rinse, oxyCODONE , polyethylene glycol, traZODone   Vital Signs    Vitals:   04/16/24 1623 04/16/24 2128 04/17/24 0000 04/17/24 0844  BP: (!) 135/51 (!) 120/58 110/63 112/64  Pulse: 80 91 93 84  Resp: 19  18 18   Temp: 98.9 F (37.2 C) 99 F (37.2 C) 98.7 F (37.1 C)   TempSrc:  Oral Oral   SpO2: 100% 99% 100% 98%  Weight:      Height:        Intake/Output Summary (Last 24 hours) at 04/17/2024 0848 Last data filed at 04/17/2024 0124 Gross per 24 hour  Intake 200 ml  Output 1050 ml  Net -850 ml   Filed Weights   04/14/24 0400 04/15/24 0409 04/16/24 0712  Weight: 116.3 kg 117.7 kg 115.5 kg    Telemetry    V paced- Personally Reviewed  ECG    NA - Personally Reviewed  Physical Exam   GEN: No acute distress.   Neck: No  JVD Cardiac: RRR, 3/6 apical systolic murmur, no diastolic murmurs, rubs, or gallops.  Respiratory:     Decreased breath sounds at the bases.  GI: Soft, nontender, non-distended  MS:    Diffuse upper extremity edema, mild lower edema; No deformity. Neuro:  Nonfocal  Psych: Normal affect   Labs    Chemistry Recent Labs  Lab 04/14/24 0452 04/15/24 0330 04/16/24 0131   NA 138 137 136  K 3.4* 3.6 3.6  CL 103 103 103  CO2 25 24 24   GLUCOSE 113* 111* 105*  BUN 12 5* 6*  CREATININE 0.84 0.68 0.69  CALCIUM  8.7* 8.4* 8.2*  GFRNONAA >60 >60 >60  ANIONGAP 10 10 9      Hematology Recent Labs  Lab 04/13/24 0202 04/15/24 0330 04/16/24 0131  WBC 7.3 5.9 5.8  RBC 2.88* 2.69* 2.42*  HGB 9.6* 8.9* 8.1*  HCT 27.3* 25.6* 23.4*  MCV 94.8 95.2 96.7  MCH 33.3 33.1 33.5  MCHC 35.2 34.8 34.6  RDW 13.9 14.1 14.0  PLT 105* 76* 77*    Cardiac EnzymesNo results for input(s): TROPONINI in the last 168 hours. No results for input(s): TROPIPOC in the last 168 hours.   BNP No results for input(s): BNP, PROBNP in the last 168 hours.    DDimer No results for input(s): DDIMER in the last 168 hours.   Radiology    ECHO TEE Result Date: 04/15/2024    TRANSESOPHOGEAL ECHO REPORT   Patient Name:   Tim [name] Date of Exam: 04/15/2024 Medical Rec #:  [id number]       Height:  76.0 in Accession #:    [id number]      Weight:       259.5 lb Date of Birth:  1941-06-13       BSA:          2.474 m Patient Age:    83 years        BP:           121/51 mmHg Patient Gender: M               HR:           79 bpm. Exam Location:  Inpatient Procedure: Transesophageal Echo, Color Doppler, Cardiac Doppler and 3D Echo            (Both Spectral and Color Flow Doppler were utilized during            procedure). Indications:     Aortic Stenosis  History:         Patient has prior history of Echocardiogram examinations, most                  recent 04/10/2024. CHF, Previous Myocardial Infarction and CAD,                  Defibrillator and Pacemaker, Pulmonary HTN and COPD, s/p AVR,                  29 mm Edwards Sapien prosthetic, stented (TAVR) valve present                  in the aortic position., Aortic Valve Disease and Mitral Valve                  Disease; Risk Factors:Sleep Apnea and Hypertension.                  Aortic Valve: 29 mm Sapien prosthetic, stented (TAVR) valve  is                  present in the aortic position.  Sonographer:     Koleen Popper RDCS Referring Phys:  [id number] WADDELL A PARCELLS Diagnosing Phys: Vinie Maxcy MD PROCEDURE: After discussion of the risks and benefits of a TEE, an informed consent was obtained from the patient. The transesophogeal probe was passed without difficulty through the esophogus of the patient. Imaged were obtained with the patient in a supine position. Sedation performed by different physician. The patient was monitored while under deep sedation. Anesthestetic sedation was provided intravenously by Anesthesiology: 220mg  of Propofol , 60mg  of Lidocaine . The patient developed no complications during the procedure.  IMPRESSIONS  1. Left ventricular ejection fraction, by estimation, is 65 to 70%. The left ventricle has normal function. There is mild left ventricular hypertrophy.  2. Right ventricular systolic function is normal. The right ventricular size is normal.  3. Calcified small mobile mass on the anterior leaflet, likely old vegetation. The mitral valve is degenerative. Mild mitral valve regurgitation.  4. The aortic valve has been repaired/replaced. There is a 29 mm Sapien prosthetic (TAVR) valve present in the aortic position. Echo findings are consistent with stenosis of the aortic prosthesis. EOA, by VTI measures 0.55 cm. Aortic valve mean gradient measures 40.0 mmHg. Aortic valve Vmax measures 4.13 m/s. Peak gradient 68.2 mmHg, DI 0.20.  5. 3D performed of the aortic valve and demonstrates 3D evaluation of the aortic valve prosthesis demonstrates severe prosthetic aortic valve stenosis.  6. Left atrial size was moderately dilated. No left atrial/left  atrial appendage thrombus was detected. Conclusion(s)/Recommendation(s): No evidence of vegetation/infective endocarditis on this transesophageael echocardiogram. 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 FINDINGS  Left Ventricle: Left ventricular ejection fraction, by estimation, is 65 to 70%. The left ventricle has normal function. The left ventricular internal cavity size was normal in size. There is mild left ventricular hypertrophy. Right Ventricle: The right ventricular size is normal. No increase in right ventricular wall thickness. Right ventricular systolic function is normal. Left Atrium: Left atrial size was moderately dilated. No left atrial/left atrial appendage thrombus was detected. Right Atrium: Right atrial size was normal in size. Pericardium: There is no evidence of pericardial effusion. Mitral Valve: Calcified small mobile mass on the anterior leaflet, likely old vegetation. The mitral valve is degenerative in appearance. Mild to moderate mitral annular calcification. Mild mitral valve regurgitation. Tricuspid Valve: The tricuspid valve is grossly normal. Tricuspid valve regurgitation is trivial. Aortic Valve: The aortic valve has been repaired/replaced. Aortic valve regurgitation is not visualized. Severe aortic stenosis is present. Aortic valve mean gradient measures 40.0 mmHg. Aortic valve peak gradient measures 68.2 mmHg. Aortic valve area, by VTI measures 0.55 cm. There is a 29 mm Sapien prosthetic, stented (TAVR) valve present in the aortic position. Pulmonic Valve: The pulmonic valve was grossly normal. Pulmonic valve regurgitation is not visualized. Aorta: The aortic root and ascending aorta are structurally normal, with no evidence of dilitation. IAS/Shunts: No atrial level shunt detected by color flow Doppler. Additional Comments: 3D was performed not requiring image post processing on an independent workstation and was abnormal. Spectral Doppler performed. LEFT VENTRICLE PLAX 2D LVOT diam:     1.90 cm LV SV:         46 LV SV Index:   19 LVOT Area:     2.84 cm  AORTIC VALVE AV Area (Vmax):    0.74 cm AV Area (Vmean):    0.77 cm AV Area (VTI):     0.55 cm AV Vmax:           413.00 cm/s AV Vmean:          296.000 cm/s AV VTI:            0.834 m AV Peak Grad:      68.2 mmHg AV Mean Grad:      40.0 mmHg LVOT Vmax:         108.00 cm/s LVOT Vmean:        80.400 cm/s LVOT VTI:          0.163 m LVOT/AV VTI ratio: 0.20  AORTA Ao Asc diam: 3.50 cm  SHUNTS Systemic VTI:  0.16 m Systemic Diam: 1.90 cm Vinie Maxcy MD Electronically signed by Vinie Maxcy MD Signature Date/Time: 04/15/2024/3:20:08 PM    Final    EP STUDY Result Date: 04/15/2024 See surgical note for result.    Cardiac Studies   See above  Patient Profile     83 y.o. 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  Cardiology consulted for severe prosthetic valve stenosis and possible mitral valve vegetation.    Assessment & Plan    Shock:  Resolved. Thought to be related to hypovolemia and responsive to volume.  Blood cultures negative to date.  Holding home blood pressure medications.  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  Echocardiogram this admission shows EF 65 to 70%, normal RV function, possible vegetation versus calcium  on anterior mitral valve leaflet, severe bioprosthetic aortic valve stenosis with mean gradient 46 mmHg, V-max 4.6 m/s, EOA 0.8 cm.  Cath 03/24/24 at [facility] showed stenosis in small branch for which medical management was recommended, also with 60% mid RCA stenosis. -TEE showed EF 65 to 70%, normal RV function calcified small mobile mass on anterior mitral valve leaflet likely old vegetation, mild mitral regurgitation, severe prosthetic aortic valve stenosis with V-max 4.1 m/s,  mean gradient 40 mmHg, EOA 0.6 cm, DI 0.2 -Given no evidence of active infection, and patient now with severe stenosis of TAVR valve, and considering on previous evaluation by cardiac surgery not felt to be surgical candidate, recommended structural heart team evaluation to consider valve-in-valve TAVR.  Has followed at [facility], has appointment on 10/23 with their structural team to evaluate for VIV TAVR   Mitral valve lesion: This is thought to be calcification rather than active vegetation.     AKI: Resolved with IV fluids   CAD: No current acute coronary symptoms.  Admitted at [name] 03/2024 with NSTEMI, showed 95% ostial diagonal stenosis, small vessel and medical management recommended.  Plan continued medical management.  Discontinued aspirin  as he is on Eliquis  chronically, would restart while eliquis  on hold due to history of coronary stenting   PAF: No recurrent paroxysms.  On Eliquis , currently on hold given hematuria, which is resolving   Complete heart block: Status post permanent pacemaker with normal function.  ICD interrogation 04/02/2024 showed battery at 4 months.  Discussed referral to EP states he will follow-up with his EP doctor at [facility] will sign off.   Medication Recommendations: restart Eliquis  5 mg twice daily once OK per urology (would continue aspirin  81 mg daily until Eliquis  restarted but can discontinue aspirin  once restart Eliquis ).  Would hold home amlodipine and carvedilol and losartan  and torsemide , has been normotensive off antihypertensive medications Other recommendations (labs, testing, etc): None Follow up as an outpatient: Has follow-up scheduled at [facility] on 10/23    For questions or updates, please contact [facility] Please consult [email or web address] for contact info under Cardiology/STEMI.   Signed, Lonni LITTIE Nanas, MD  04/17/2024, 8:48 AM

## 2024-04-18 DIAGNOSIS — R579 Shock, unspecified: Secondary | ICD-10-CM | POA: Diagnosis not present

## 2024-04-18 LAB — BASIC METABOLIC PANEL WITH GFR
Anion gap: 9 (ref 5–15)
BUN: 6 mg/dL — ABNORMAL LOW (ref 8–23)
CO2: 23 mmol/L (ref 22–32)
Calcium: 8 mg/dL — ABNORMAL LOW (ref 8.9–10.3)
Chloride: 106 mmol/L (ref 98–111)
Creatinine, Ser: 0.68 mg/dL (ref 0.61–1.24)
GFR, Estimated: >60 mL/min (ref >60)
Glucose, Bld: 109 mg/dL — ABNORMAL HIGH (ref 70–99)
Potassium: 3.7 mmol/L (ref 3.5–5.1)
Sodium: 138 mmol/L (ref 135–145)

## 2024-04-18 LAB — CBC
HCT: 23 % — ABNORMAL LOW (ref 39.0–52.0)
Hemoglobin: 8 g/dL — ABNORMAL LOW (ref 13.0–17.0)
MCH: 33.5 pg (ref 26.0–34.0)
MCHC: 34.8 g/dL (ref 30.0–36.0)
MCV: 96.2 fL (ref 80.0–100.0)
Platelets: 65 K/uL — ABNORMAL LOW (ref 150–400)
RBC: 2.39 MIL/uL — ABNORMAL LOW (ref 4.22–5.81)
RDW: 13.7 % (ref 11.5–15.5)
WBC: 5.4 K/uL (ref 4.0–10.5)
nRBC: 0 % (ref 0.0–0.2)

## 2024-04-18 LAB — MAGNESIUM: Magnesium: 1.3 mg/dL — ABNORMAL LOW (ref 1.7–2.4)

## 2024-04-18 LAB — GLUCOSE, CAPILLARY
Glucose-Capillary: 91 mg/dL (ref 70–99)
Glucose-Capillary: 108 mg/dL — ABNORMAL HIGH (ref 70–99)
Glucose-Capillary: 106 mg/dL — ABNORMAL HIGH (ref 70–99)

## 2024-04-18 MED ORDER — GABAPENTIN 800 MG PO TABS: 800.0000 mg | ORAL_TABLET | Freq: Two times a day (BID) | ORAL | Status: DC

## 2024-04-18 MED ORDER — TORSEMIDE 20 MG PO TABS: 20.0000 mg | ORAL_TABLET | Freq: Every day | ORAL | 1 refills | Status: DC | PRN

## 2024-04-18 MED ORDER — NYSTATIN 100000 UNIT/GM EX POWD: Freq: Two times a day (BID) | CUTANEOUS | 0 refills | Status: AC

## 2024-04-18 MED ORDER — DOCUSATE SODIUM 100 MG PO CAPS: 100.0000 mg | ORAL_CAPSULE | Freq: Two times a day (BID) | ORAL | Status: DC | PRN

## 2024-04-18 NOTE — Progress Notes (Signed)
 3 Days Post-Op Subjective: Stepped in to check on Tim [name] this morning.  He was sleeping, I did not wake him.  Urine is clear today.  Only very slightly blood-tinged.  Objective: Vital signs in last 24 hours: Temp:  [97.7 F (36.5 C)-99.6 F (37.6 C)] 98.6 F (37 C) (10/17 0755) Pulse Rate:  [68-95] 68 (10/17 0755) Resp:  [18-19] 19 (10/17 0442) BP: (110-133)/(54-81) 110/81 (10/17 0755) SpO2:  [95 %-100 %] 95 % (10/17 0755) Weight:  [115.4 kg] 115.4 kg (10/17 0500)  Assessment/Plan: # Gross hematuria # Anticoagulation  Urine has almost completely cleared.  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  Regarding the anticoagulation, if the only reason the patient is on it is prophylactic for atrial fibrillation he has not had for many years, ideally he would be off of it.  If he absolutely must have it then please wait until patient has been completely clear of bleeding for 48 hours before retrialing.  CT hematuria imaging with cystoscopy evaluation on an outpatient basis.  Dr. Renda will be arranging this.  Urology will sign off at this time.  Please call with questions.  Intake/Output from previous day: 10/16 0701 - 10/17 0700 In: -  Out: 825 [Urine:825]  Intake/Output this shift: No intake/output data recorded.  Physical Exam:  General: Sleeping CV: No cyanosis Lungs: equal chest rise Abdomen: Soft, NTND, no rebound or guarding Gu: Pure wick in place draining clear urine, lightly blood-tinged  Lab Results: Recent Labs    04/16/24 0131 04/17/24 1113 04/18/24 0143  HGB 8.1* 8.2* 8.0*  HCT 23.4* 23.4* 23.0*   BMET Recent Labs    04/16/24 0131 04/17/24 1113 04/18/24 0143  NA 136  --  138  K 3.6  --  3.7  CL 103  --  106  CO2 24  --  23  GLUCOSE 105*  --  109*  BUN 6*  --  6*  CREATININE 0.69  --  0.68  CALCIUM  8.2*  --  8.0*  HGB 8.1* 8.2* 8.0*  WBC 5.8 4.9 5.4     Studies/Results: CT ABDOMEN PELVIS W WO CONTRAST Result  Date: 04/18/2024 CLINICAL DATA:  Gross hematuria. EXAM: CT ABDOMEN AND PELVIS WITHOUT AND WITH CONTRAST TECHNIQUE: Multidetector CT imaging of the abdomen and pelvis was performed following the standard protocol before and following the bolus administration of intravenous contrast. RADIATION DOSE REDUCTION: This exam was performed according to the departmental dose-optimization program which includes automated exposure control, adjustment of the mA and/or kV according to patient size and/or use of iterative reconstruction technique. CONTRAST:  OMNIPAQUE  IOHEXOL  350 MG/ML SOLN COMPARISON:  CT stone study 12/04/2019 FINDINGS: Lower chest: Dependent atelectasis. Hepatobiliary: No suspicious focal abnormality within the liver parenchyma. Cholecystectomy. Mild progression of common duct distension measuring 17 mm diameter today compared to 14 mm previously, potentially related to prior cholecystectomy. Common bile duct gradually tapers into the ampulla. Pancreas: No focal mass lesion. No dilatation of the main duct. No intraparenchymal cyst. No peripancreatic edema. Spleen: No splenomegaly. No suspicious focal mass lesion. Adrenals/Urinary Tract: No adrenal nodule or mass. Pre contrast imaging shows no stones in either kidney or ureter no hydroureteronephrosis. No bladder stones. Imaging after IV contrast administration reveals no suspicious enhancing mass lesion in either kidney. Delayed post-contrast imaging shows no wall thickening or soft tissue filling defect in either intrarenal collecting system or renal pelvis. Neither ureter is completely opacified. Within this limitation there is no focal hydroureter evidence of  a mass lesion along the course of either ureter. Delayed imaging of the bladder shows incomplete opacification of the bladder lumen. No discrete or focal mucosal mass lesion evident. Prostatic mass-effect on the bladder base compatible with median lobe hypertrophy. Subtle bladder wall  trabeculation evident. Although not definite, a suggestion of asymmetric minimal non focal wall thickening noted right bladder (axial image 76/series 7). Stomach/Bowel: Stomach is unremarkable. No gastric wall thickening. No evidence of outlet obstruction. Duodenum is normally positioned as is the ligament of Treitz. No small bowel wall thickening. No small bowel dilatation. No gross colonic mass. No colonic wall thickening. Vascular/Lymphatic: There is moderate atherosclerotic calcification of the abdominal aorta without aneurysm. There is no gastrohepatic or hepatoduodenal ligament lymphadenopathy. No retroperitoneal or mesenteric lymphadenopathy. No pelvic sidewall lymphadenopathy. Reproductive: Prostate size upper normal. Other: No intraperitoneal free fluid. Musculoskeletal: No worrisome lytic or sclerotic osseous abnormality. Thoracic spinal stimulator device evident. IMPRESSION: 1. No acute findings in the abdomen or pelvis. Specifically, no definite findings to explain the patient's history of hematuria. 2. Prostatic mass-effect on the bladder base compatible with median lobe hypertrophy. Subtle bladder wall trabeculation evident. Although not definite, a suggestion of asymmetric minimal non focal wall thickening noted right bladder. Cystoscopy may prove helpful to further evaluate. 3. Mild progression of common duct distension measuring 17 mm today compared to 14 mm previously, potentially related to prior cholecystectomy. Common bile duct gradually tapers into the ampulla. Correlation with liver function test may prove helpful. 4.  Aortic Atherosclerosis (ICD10-I70.0). Electronically Signed   By: Camellia Candle M.D.   On: 04/18/2024 06:28      LOS: 8 days   Ole Bourdon, NP [facility] Pager: [phone]  04/18/2024, 8:27 AM

## 2024-04-18 NOTE — TOC Transition Note (Signed)
 Transition of Care [facility]) - Discharge Note   Patient Details  Name: Tim [name] MRN: [id number] Date of Birth: 1941-01-11  Transition of Care [facility]) CM/SW Contact:  Andrez JULIANNA George, RN Phone Number: 04/18/2024, 12:43 PM   Clinical Narrative:     Pt and spouse have decided to dc home instead of rehab. They asked to use [facility] for [facility] as they have used them before. [facility] accepted. Information on the AVS.  Pt has needed DME at home except a recommended Roho Cushion. CM has sent order to [employer]. [employer] will have the cushion sent to pts home.  Wife is able to provide needed supervision and assistance at home.  Wife is transporting him home.  Final next level of care: Home w Home Health Services Barriers to Discharge: No Barriers Identified   Patient Goals and CMS Choice   CMS Medicare.gov Compare Post Acute Care list provided to:: Patient Choice offered to / list presented to : Patient, Spouse      Discharge Placement                       Discharge Plan and Services Additional resources added to the After Visit Summary for                  DME Arranged:  (Roho cushion) DME Agency: [employer] Date DME Agency Contacted: 04/18/24   Representative spoke with at DME Agency: [facility] HH Arranged: OT, PT, RN HH Agency: [facility] ([facility] Date HH Agency Contacted: 04/18/24   Representative spoke with at [facility] Agency: [facility]  Social Drivers of Health (SDOH) Interventions SDOH Screenings   Food Insecurity: No Food Insecurity (04/14/2024)  Housing: Low Risk  (04/14/2024)  Transportation Needs: No Transportation Needs (04/14/2024)  Utilities: Not At Risk (04/14/2024)  Depression (PHQ2-9): Low Risk  (03/31/2024)  Financial Resource Strain: Low Risk  (09/23/2023)  Physical Activity: Unknown (09/23/2023)  Social Connections: Moderately Isolated (04/14/2024)  Stress: No Stress Concern Present (03/21/2024)   Received from [facility]  Tobacco  Use: Low Risk  (04/10/2024)     Readmission Risk Interventions    04/15/2024    3:20 PM 04/14/2024    4:33 PM  Readmission Risk Prevention Plan  Transportation Screening Complete Complete  PCP or Specialist Appt within 5-7 Days Complete Complete  Home Care Screening Complete Complete  Medication Review (RN CM) Complete Referral to Pharmacy

## 2024-04-18 NOTE — Plan of Care (Signed)

## 2024-04-18 NOTE — TOC Progression Note (Signed)
 Transition of Care [name]) - Progression Note    Patient Details  Name: Tim [name] MRN: [id number] Date of Birth: 11-22-1940  Transition of Care [facility] CM/SW Contact  Sherline Clack, [place] Phone Number: 04/18/2024, 5:02 PM  Clinical Narrative:     Patient unable to get up into a wheelchair despite help from two NT's. CSW spoke with wife and wife would like to stay one more night. CSW spoke about SNF with wife again, but wife is unsure and would like CSW to give her time to decide. Weekend coverage CSW made aware.  Expected Discharge Plan: Skilled Nursing Facility Barriers to Discharge: No Barriers Identified               Expected Discharge Plan and Services       Living arrangements for the past 2 months: Single Family Home Expected Discharge Date: 04/18/24               DME Arranged:  (Roho cushion) DME Agency: [employer] Date DME Agency Contacted: 04/18/24   Representative spoke with at DME Agency: [place] HH Arranged: OT, PT, RN HH Agency: [facility] Date HH Agency Contacted: 04/18/24   Representative spoke with at [facility] Agency: [facility]   Social Drivers of Health (SDOH) Interventions SDOH Screenings   Food Insecurity: No Food Insecurity (04/14/2024)  Housing: Low Risk  (04/14/2024)  Transportation Needs: No Transportation Needs (04/14/2024)  Utilities: Not At Risk (04/14/2024)  Depression (PHQ2-9): Low Risk  (03/31/2024)  Financial Resource Strain: Low Risk  (09/23/2023)  Physical Activity: Unknown (09/23/2023)  Social Connections: Moderately Isolated (04/14/2024)  Stress: No Stress Concern Present (03/21/2024)   Received from [facility]  Tobacco Use: Low Risk  (04/10/2024)    Readmission Risk Interventions    04/15/2024    3:20 PM 04/14/2024    4:33 PM  Readmission Risk Prevention Plan  Transportation Screening Complete Complete  PCP or Specialist Appt within 5-7 Days Complete Complete  Home Care  Screening Complete Complete  Medication Review (RN CM) Complete Referral to Pharmacy

## 2024-04-18 NOTE — Progress Notes (Signed)
 OT Cancellation Note  Patient Details Name: Tim [name] MRN: [id number] DOB: March 30, 1941   Cancelled Treatment:    Reason Eval/Treat Not Completed: Patient declined, no reason specified (Patient declined OT stating he was being discharged and wanted to rest before he leaves. OT offered to address dressing before discharge and patient declined.)  Kristopher Delk LITTIE Laine 04/18/2024, 12:40 PM  Dick Laine, OTA Acute Rehabilitation Services  Office [phone]

## 2024-04-18 NOTE — Discharge Summary (Signed)
 Physician Discharge Summary   Patient: Tim [name] MRN: [id number] DOB: 08-24-1940  Admit date:     04/10/2024  Discharge date: {dischdate:26783}  Discharge Physician: Burnard DELENA Cunning   PCP: [name], MD   Recommendations at discharge:  {Tip this will not be part of the note when signed- Example include specific recommendations for outpatient follow-up, pending tests to follow-up on. (Optional):26781}  ***  Discharge Diagnoses: Principal Problem:   Shock (HCC) Active Problems:   PAF (paroxysmal atrial fibrillation) (HCC)   Hypotension   AKI (acute kidney injury)   Confusion   Uremia   Stenosis of prosthetic aortic valve  Resolved Problems:   * No resolved hospital problems. [name] Course: No notes on file  Assessment and Plan: No notes have been filed under this hospital service. Service: Hospitalist     {Tip this will not be part of the note when signed Body mass index is 30.97 kg/m. , ,  (Optional):26781}  {(NOTE) Pain control PDMP Statment (Optional):26782} Consultants: *** Procedures performed: ***  Disposition: {Plan; Disposition:26390} Diet recommendation:  {Diet_Plan:26776} DISCHARGE MEDICATION: Allergies as of 04/18/2024       Reactions   Brimonidine Tartrate Shortness Of Breath   Alphagan-Shortness of breath   Brinzolamide Shortness Of Breath   AZOPT- Shortness of breath   Latanoprost Shortness Of Breath   XALATAN- Shortness of breath   Nucynta  [tapentadol ] Shortness Of Breath   Sulfa Antibiotics Palpitations   Timolol Maleate Shortness Of Breath, Other (See Comments)   TIMOPTIC- Aggravated asthma   Diltiazem Swelling    leg swelling   Rofecoxib Swelling    VIOXX- leg swelling   Vancomycin Rash, Other (See Comments)   Blisters   Codeine Other (See Comments)   Childhood reaction   Tamsulosin Other (See Comments)   Dizziness    Celecoxib Other (See Comments)   CELLBREX-confusion   Colchicine Diarrhea   diarrhea   Tape  Rash        Medication List     PAUSE taking these medications    amLODipine 5 MG tablet Wait to take this until your doctor or other care provider tells you to start again. Commonly known as: NORVASC Take 5 mg by mouth in the morning and at bedtime.   apixaban  5 MG Tabs tablet Wait to take this until your doctor or other care provider tells you to start again. Commonly known as: ELIQUIS  Take 5 mg by mouth 2 (two) times daily.   carvedilol 3.125 MG tablet Wait to take this until your doctor or other care provider tells you to start again. Commonly known as: COREG Take 3.125 mg by mouth 2 (two) times daily with a meal.   losartan  25 MG tablet Wait to take this until your doctor or other care provider tells you to start again. Commonly known as: COZAAR  Take 25 mg by mouth daily.       STOP taking these medications    diclofenac  75 MG EC tablet Commonly known as: VOLTAREN        TAKE these medications    albuterol  108 (90 Base) MCG/ACT inhaler Commonly known as: VENTOLIN  HFA TAKE 2 PUFFS BY MOUTH EVERY 6 HOURS AS NEEDED FOR WHEEZE OR SHORTNESS OF BREATH   allopurinol  300 MG tablet Commonly known as: ZYLOPRIM  Take 1 tablet (300 mg total) by mouth daily. with food What changed:  when to take this additional instructions   amoxicillin  500 MG capsule Commonly known as: AMOXIL  Take 2 capsules (1,000  mg total) by mouth 2 (two) times daily.   Aspirin  Low Dose 81 MG tablet Generic drug: aspirin  EC Take 81 mg by mouth daily.   B-complex with vitamin C  tablet Take 1 tablet by mouth daily. Take 1 tablet daily, 250mg    clotrimazole -betamethasone  cream Commonly known as: LOTRISONE  Apply 1 Application topically daily.   docusate sodium  100 MG capsule Commonly known as: COLACE Take 1 capsule (100 mg total) by mouth 2 (two) times daily as needed for mild constipation.   Dulera  100-5 MCG/ACT Aero Generic drug: mometasone -formoterol  Inhale 2 puffs into the lungs  as needed for wheezing or shortness of breath.   ezetimibe  10 MG tablet Commonly known as: ZETIA  Take 1 tablet (10 mg total) by mouth daily.   ferrous gluconate  324 MG tablet Commonly known as: FERGON Take 1 tablet (324 mg total) by mouth 2 (two) times daily.   FIBER PO Take 5 capsules by mouth in the morning and at bedtime.   finasteride  5 MG tablet Commonly known as: PROSCAR  Take 1 tablet (5 mg total) by mouth daily.   fluticasone  50 MCG/ACT nasal spray Commonly known as: FLONASE  Place 2 sprays into both nostrils daily. What changed:  when to take this reasons to take this   fluticasone -salmeterol 250-50 MCG/ACT Aepb Commonly known as: Wixela Inhub Inhale 1 puff into the lungs 2 (two) times daily. in the morning and at bedtime.   gabapentin  800 MG tablet Commonly known as: NEURONTIN  Take 1 tablet (800 mg total) by mouth 2 (two) times daily.   loratadine 10 MG tablet Commonly known as: CLARITIN Take 10 mg by mouth daily.   MULTIVITAMIN ADULT PO Take 2 tablets by mouth daily.   naloxone  4 MG/0.1ML Liqd nasal spray kit Commonly known as: NARCAN  One spray in nostril as needed for opoid overdose.  May repeat dose in 5 min if needed. What changed:  how much to take how to take this when to take this reasons to take this additional instructions   niacin  1000 MG CR tablet Commonly known as: NIASPAN  TAKE 1 TABLET BY MOUTH TWICE A DAY   nitroGLYCERIN  0.4 MG SL tablet Commonly known as: NITROSTAT  Place 0.4 mg under the tongue every 5 (five) minutes as needed for chest pain.   nystatin  powder Commonly known as: MYCOSTATIN /NYSTOP  Apply topically 2 (two) times daily.   Oxycodone  HCl 20 MG Tabs 1 tab po bid prn severe pain What changed:  how much to take how to take this when to take this additional instructions   pantoprazole  40 MG tablet Commonly known as: PROTONIX  Take 1 tablet (40 mg total) by mouth daily. What changed: when to take this   potassium  chloride SA 20 MEQ tablet Commonly known as: KLOR-CON  M Take 1 tablet (20 mEq total) by mouth daily. What changed:  how much to take when to take this   ranolazine 500 MG 12 hr tablet Commonly known as: RANEXA Take 500 mg by mouth 2 (two) times daily.   rosuvastatin  20 MG tablet Commonly known as: CRESTOR  Take 20 mg by mouth daily.   torsemide  20 MG tablet Commonly known as: DEMADEX  Take 1 tablet (20 mg total) by mouth daily as needed (weight gain or swelling). What changed:  when to take this reasons to take this   VITAMIN D (CHOLECALCIFEROL) PO Take 1 tablet by mouth daily.               Durable Medical Equipment  (From admission, onward)  Start     Ordered   04/18/24 1207  For home use only DME Other see comment  Once       Comments: Roho cushion  Question:  Length of Need  Answer:  Lifetime   04/18/24 1207            Contact information for follow-up providers     Katie Felts Follow up.   Why: @ 10:20am, please call Jennie at [phone] for questions or concerns. Contact information: [address]  [place], [place] [address]             Contact information for after-discharge care     Home Medical Care     [facility]) .   Service: Home Health Services Contact information: [address] [place]  [address] [phone]                    Discharge Exam: Fredricka Weights   04/15/24 0409 04/16/24 0712 04/18/24 0500  Weight: 117.7 kg 115.5 kg 115.4 kg   ***  Condition at discharge: {DC Condition:26389}  The results of significant diagnostics from this hospitalization (including imaging, microbiology, ancillary and laboratory) are listed below for reference.   Imaging Studies: CT ABDOMEN PELVIS W WO CONTRAST Result Date: 04/18/2024 CLINICAL DATA:  Gross hematuria. EXAM: CT ABDOMEN AND PELVIS WITHOUT AND WITH CONTRAST TECHNIQUE:  Multidetector CT imaging of the abdomen and pelvis was performed following the standard protocol before and following the bolus administration of intravenous contrast. RADIATION DOSE REDUCTION: This exam was performed according to the departmental dose-optimization program which includes automated exposure control, adjustment of the mA and/or kV according to patient size and/or use of iterative reconstruction technique. CONTRAST:  OMNIPAQUE  IOHEXOL  350 MG/ML SOLN COMPARISON:  CT stone study 12/04/2019 FINDINGS: Lower chest: Dependent atelectasis. Hepatobiliary: No suspicious focal abnormality within the liver parenchyma. Cholecystectomy. Mild progression of common duct distension measuring 17 mm diameter today compared to 14 mm previously, potentially related to prior cholecystectomy. Common bile duct gradually tapers into the ampulla. Pancreas: No focal mass lesion. No dilatation of the main duct. No intraparenchymal cyst. No peripancreatic edema. Spleen: No splenomegaly. No suspicious focal mass lesion. Adrenals/Urinary Tract: No adrenal nodule or mass. Pre contrast imaging shows no stones in either kidney or ureter no hydroureteronephrosis. No bladder stones. Imaging after IV contrast administration reveals no suspicious enhancing mass lesion in either kidney. Delayed post-contrast imaging shows no wall thickening or soft tissue filling defect in either intrarenal collecting system or renal pelvis. Neither ureter is completely opacified. Within this limitation there is no focal hydroureter evidence of a mass lesion along the course of either ureter. Delayed imaging of the bladder shows incomplete opacification of the bladder lumen. No discrete or focal mucosal mass lesion evident. Prostatic mass-effect on the bladder base compatible with median lobe hypertrophy. Subtle bladder wall trabeculation evident. Although not definite, a suggestion of asymmetric minimal non focal wall thickening noted right bladder  (axial image 76/series 7). Stomach/Bowel: Stomach is unremarkable. No gastric wall thickening. No evidence of outlet obstruction. Duodenum is normally positioned as is the ligament of Treitz. No small bowel wall thickening. No small bowel dilatation. No gross colonic mass. No colonic wall thickening. Vascular/Lymphatic: There is moderate atherosclerotic calcification of the abdominal aorta without aneurysm. There is no gastrohepatic or hepatoduodenal ligament lymphadenopathy. No retroperitoneal or mesenteric lymphadenopathy. No pelvic sidewall lymphadenopathy. Reproductive: Prostate size upper normal. Other: No intraperitoneal  free fluid. Musculoskeletal: No worrisome lytic or sclerotic osseous abnormality. Thoracic spinal stimulator device evident. IMPRESSION: 1. No acute findings in the abdomen or pelvis. Specifically, no definite findings to explain the patient's history of hematuria. 2. Prostatic mass-effect on the bladder base compatible with median lobe hypertrophy. Subtle bladder wall trabeculation evident. Although not definite, a suggestion of asymmetric minimal non focal wall thickening noted right bladder. Cystoscopy may prove helpful to further evaluate. 3. Mild progression of common duct distension measuring 17 mm today compared to 14 mm previously, potentially related to prior cholecystectomy. Common bile duct gradually tapers into the ampulla. Correlation with liver function test may prove helpful. 4.  Aortic Atherosclerosis (ICD10-I70.0). Electronically Signed   By: Camellia Candle M.D.   On: 04/18/2024 06:28   ECHO TEE Result Date: 04/15/2024    TRANSESOPHOGEAL ECHO REPORT   Patient Name:   Tim [name] Date of Exam: 04/15/2024 Medical Rec #:  [id number]       Height:       76.0 in Accession #:    [id number]      Weight:       259.5 lb Date of Birth:  [birth year]       BSA:          2.474 m Patient Age:    83 years        BP:           121/51 mmHg Patient Gender: M               HR:           79  bpm. Exam Location:  Inpatient Procedure: Transesophageal Echo, Color Doppler, Cardiac Doppler and 3D Echo            (Both Spectral and Color Flow Doppler were utilized during            procedure). Indications:     Aortic Stenosis  History:         Patient has prior history of Echocardiogram examinations, most                  recent 04/10/2024. CHF, Previous Myocardial Infarction and CAD,                  Defibrillator and Pacemaker, Pulmonary HTN and COPD, s/p AVR,                  29 mm Edwards Sapien prosthetic, stented (TAVR) valve present                  in the aortic position., Aortic Valve Disease and Mitral Valve                  Disease; Risk Factors:Sleep Apnea and Hypertension.                  Aortic Valve: 29 mm Sapien prosthetic, stented (TAVR) valve is                  present in the aortic position.  Sonographer:     Koleen Popper RDCS Referring Phys:  [id number] WADDELL A PARCELLS Diagnosing Phys: Vinie Maxcy MD PROCEDURE: After discussion of the risks and benefits of a TEE, an informed consent was obtained from the patient. The transesophogeal probe was passed without difficulty through the esophogus of the patient. Imaged were obtained with the patient in a supine position. Sedation performed by different physician. The patient was monitored while under deep  sedation. Anesthestetic sedation was provided intravenously by Anesthesiology: 220mg  of Propofol , 60mg  of Lidocaine . The patient developed no complications during the procedure.  IMPRESSIONS  1. Left ventricular ejection fraction, by estimation, is 65 to 70%. The left ventricle has normal function. There is mild left ventricular hypertrophy.  2. Right ventricular systolic function is normal. The right ventricular size is normal.  3. Calcified small mobile mass on the anterior leaflet, likely old vegetation. The mitral valve is degenerative. Mild mitral valve regurgitation.  4. The aortic valve has been repaired/replaced. There is a 29 mm  Sapien prosthetic (TAVR) valve present in the aortic position. Echo findings are consistent with stenosis of the aortic prosthesis. EOA, by VTI measures 0.55 cm. Aortic valve mean gradient measures 40.0 mmHg. Aortic valve Vmax measures 4.13 m/s. Peak gradient 68.2 mmHg, DI 0.20.  5. 3D performed of the aortic valve and demonstrates 3D evaluation of the aortic valve prosthesis demonstrates severe prosthetic aortic valve stenosis.  6. Left atrial size was moderately dilated. No left atrial/left atrial appendage thrombus was detected. Conclusion(s)/Recommendation(s): No evidence of vegetation/infective endocarditis on this transesophageael echocardiogram. Severe stenosis of the aortic TAVR valve- consider structural heart team evaluation for possible valve-in-valve procedure if there are not other contraindications to the procedure. FINDINGS  Left Ventricle: Left ventricular ejection fraction, by estimation, is 65 to 70%. The left ventricle has normal function. The left ventricular internal cavity size was normal in size. There is mild left ventricular hypertrophy. Right Ventricle: The right ventricular size is normal. No increase in right ventricular wall thickness. Right ventricular systolic function is normal. Left Atrium: Left atrial size was moderately dilated. No left atrial/left atrial appendage thrombus was detected. Right Atrium: Right atrial size was normal in size. Pericardium: There is no evidence of pericardial effusion. Mitral Valve: Calcified small mobile mass on the anterior leaflet, likely old vegetation. The mitral valve is degenerative in appearance. Mild to moderate mitral annular calcification. Mild mitral valve regurgitation. Tricuspid Valve: The tricuspid valve is grossly normal. Tricuspid valve regurgitation is trivial. Aortic Valve: The aortic valve has been repaired/replaced. Aortic valve regurgitation is not visualized. Severe aortic stenosis is present. Aortic valve mean gradient measures  40.0 mmHg. Aortic valve peak gradient measures 68.2 mmHg. Aortic valve area, by VTI measures 0.55 cm. There is a 29 mm Sapien prosthetic, stented (TAVR) valve present in the aortic position. Pulmonic Valve: The pulmonic valve was grossly normal. Pulmonic valve regurgitation is not visualized. Aorta: The aortic root and ascending aorta are structurally normal, with no evidence of dilitation. IAS/Shunts: No atrial level shunt detected by color flow Doppler. Additional Comments: 3D was performed not requiring image post processing on an independent workstation and was abnormal. Spectral Doppler performed. LEFT VENTRICLE PLAX 2D LVOT diam:     1.90 cm LV SV:         46 LV SV Index:   19 LVOT Area:     2.84 cm  AORTIC VALVE AV Area (Vmax):    0.74 cm AV Area (Vmean):   0.77 cm AV Area (VTI):     0.55 cm AV Vmax:           413.00 cm/s AV Vmean:          296.000 cm/s AV VTI:            0.834 m AV Peak Grad:      68.2 mmHg AV Mean Grad:      40.0 mmHg LVOT Vmax:  108.00 cm/s LVOT Vmean:        80.400 cm/s LVOT VTI:          0.163 m LVOT/AV VTI ratio: 0.20  AORTA Ao Asc diam: 3.50 cm  SHUNTS Systemic VTI:  0.16 m Systemic Diam: 1.90 cm Vinie Maxcy MD Electronically signed by Vinie Maxcy MD Signature Date/Time: 04/15/2024/3:20:08 PM    Final    EP STUDY Result Date: 04/15/2024 See surgical note for result.  VAS US  LOWER EXTREMITY VENOUS (DVT) Result Date: 04/14/2024  Lower Venous DVT Study Patient Name:  Tim [name]  Date of Exam:   04/14/2024 Medical Rec #: [id number]        Accession #:    [id number] Date of Birth: May 11, 1941        Patient Gender: M Patient Age:   12 years Exam Location:  [facility] Procedure:      VAS US  LOWER EXTREMITY VENOUS (DVT) Referring Phys: [facility] --------------------------------------------------------------------------------  Indications: Swelling, and Edema.  Comparison Study: Previous study of bilateral lower extremities on 1.6.2023. Performing  Technologist: Edilia Elden Appl  Examination Guidelines: A complete evaluation includes B-mode imaging, spectral Doppler, color Doppler, and power Doppler as needed of all accessible portions of each vessel. Bilateral testing is considered an integral part of a complete examination. Limited examinations for reoccurring indications may be performed as noted. The reflux portion of the exam is performed with the patient in reverse Trendelenburg.  +-----+---------------+---------+-----------+----------+--------------+ RIGHTCompressibilityPhasicitySpontaneityPropertiesThrombus Aging +-----+---------------+---------+-----------+----------+--------------+ CFV  Full           Yes      Yes                                 +-----+---------------+---------+-----------+----------+--------------+ SFJ  Full           Yes      Yes                                 +-----+---------------+---------+-----------+----------+--------------+   +---------+---------------+---------+-----------+----------+-------------------+ LEFT     CompressibilityPhasicitySpontaneityPropertiesThrombus Aging      +---------+---------------+---------+-----------+----------+-------------------+ CFV      Full           Yes      Yes                                      +---------+---------------+---------+-----------+----------+-------------------+ SFJ      Full           Yes      Yes                                      +---------+---------------+---------+-----------+----------+-------------------+ FV Prox  Full                                                             +---------+---------------+---------+-----------+----------+-------------------+ FV Mid   Full                                                             +---------+---------------+---------+-----------+----------+-------------------+  FV DistalFull                                                              +---------+---------------+---------+-----------+----------+-------------------+ PFV      Full                                                             +---------+---------------+---------+-----------+----------+-------------------+ POP      Full           Yes      Yes                                      +---------+---------------+---------+-----------+----------+-------------------+ PTV                                                   Not well visualized +---------+---------------+---------+-----------+----------+-------------------+ PERO                                                  Not well visualized +---------+---------------+---------+-----------+----------+-------------------+ Fluid collection noted in the left popliteal fossa measuring 4.2 x 0.7 x x1.5 cm. Left posterior tibial veins and peroneal veins were not visualized due to wrap around bandages.    Summary: RIGHT: - No evidence of common femoral vein obstruction.   LEFT: - There is no evidence of deep vein thrombosis in the lower extremity.  - A cystic structure is found in the popliteal fossa.  *See table(s) above for measurements and observations. Electronically signed by Debby Robertson on 04/14/2024 at 10:47:34 AM.    Final    DG Foot 2 Views Left Result Date: 04/12/2024 EXAM: 2 VIEW(S) XRAY OF THE LEFT FOOT 04/12/2024 09:51:00 AM COMPARISON: None available. 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 Midfoot degenerative changes. Calcaneal spur. No joint dislocation. SOFT TISSUES: Soft tissue swelling of the forefoot. Unchanged tiny, linear radiodensity along the plantar aspect of the mid foot measuring 5 mm IMPRESSION: 1. 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 No convincing evidence for acute osteomyelitis. 2. Soft tissue swelling of the forefoot. Electronically  signed by: Waddell Calk MD 04/12/2024 01:00 PM EDT RP Workstation: [id number]   ECHOCARDIOGRAM COMPLETE Result Date: 04/10/2024    ECHOCARDIOGRAM REPORT   Patient Name:   Tim [name] Date of Exam: 04/10/2024 Medical Rec #:  [id number]       Height:       76.0 in Accession #:    [id number]      Weight:       262.1 lb Date of Birth:  06/19/1941       BSA:          2.485 m Patient Age:  83 years        BP:           94/63 mmHg Patient Gender: M               HR:           89 bpm. Exam Location:  Inpatient Procedure: 2D Echo, Cardiac Doppler, Color Doppler and Intracardiac            Opacification Agent (Both Spectral and Color Flow Doppler were            utilized during procedure). Indications:    Shock R57.9  History:        Patient has prior history of Echocardiogram examinations, most                 recent 04/29/2020. CHF, CAD and Previous Myocardial Infarction,                 Defibrillator, COPD and Pulmonary HTN, Arrythmias:Atrial                 Fibrillation, Signs/Symptoms:Edema, Dyspnea and Murmur; Risk                 Factors:Sleep Apnea, Hypertension and Diabetes. H/O Shock,                 Aortic stenosis S/P TAVR, Hypotension, Mitral valve vegitation,                 Prosthetic valve endocarditis, Small PFO.  Sonographer:    BERNARDA ROCKS Referring Phys: [id number] JOSHUA C SMITH IMPRESSIONS  1. Left ventricular ejection fraction, by estimation, is 65 to 70%. The left ventricle has normal function. The left ventricle has no regional wall motion abnormalities. There is mild concentric left ventricular hypertrophy. Left ventricular diastolic parameters are indeterminate.  2. Right ventricular systolic function is normal. The right ventricular size is normal.  3. Left atrial size was moderately dilated.  4. Possible vegetation versus mobile calcium  on anterior leaflet. The mitral valve is abnormal. No evidence of mitral valve regurgitation. Mild mitral stenosis. Severe mitral annular calcification.  5.  The aortic valve is abnormal. There is severe calcifcation of the aortic valve. Aortic valve regurgitation is not visualized. Severe aortic valve stenosis. Aortic valve area, by VTI measures 0.76 cm. Aortic valve mean gradient measures 46.0 mmHg. Aortic valve Vmax measures 4.58 m/s.  6. The inferior vena cava is normal in size with greater than 50% respiratory variability, suggesting right atrial pressure of 3 mmHg. Conclusion(s)/Recommendation(s): Technically very difficult study due to poor sound wave transmission. FINDINGS  Left Ventricle: Left ventricular ejection fraction, by estimation, is 65 to 70%. The left ventricle has normal function. The left ventricle has no regional wall motion abnormalities. Definity  contrast agent was given IV to delineate the left ventricular  endocardial borders. The left ventricular internal cavity size was normal in size. There is mild concentric left ventricular hypertrophy. Left ventricular diastolic parameters are indeterminate. Right Ventricle: The right ventricular size is normal. No increase in right ventricular wall thickness. Right ventricular systolic function is normal. Left Atrium: Left atrial size was moderately dilated. Right Atrium: Right atrial size was normal in size. Pericardium: There is no evidence of pericardial effusion. Mitral Valve: Possible vegetation versus mobile calcium  on anterior leaflet. The mitral valve is abnormal. There is mild calcification of the mitral valve leaflet(s). Severe mitral annular calcification. No evidence of mitral valve regurgitation. Mild mitral valve stenosis. MV peak gradient, 11.7 mmHg. The mean  mitral valve gradient is 5.0 mmHg. Tricuspid Valve: The tricuspid valve is not well visualized. Tricuspid valve regurgitation is not demonstrated. No evidence of tricuspid stenosis. Aortic Valve: The aortic valve is abnormal. There is severe calcifcation of the aortic valve. Aortic valve regurgitation is not visualized. Severe aortic  stenosis is present. Aortic valve mean gradient measures 46.0 mmHg. Aortic valve peak gradient measures 83.9 mmHg. Aortic valve area, by VTI measures 0.76 cm. 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 Pulmonic Valve: The pulmonic valve was not well visualized. Pulmonic valve regurgitation is not visualized. No evidence of pulmonic stenosis. Aorta: The aortic root is normal in size and structure. Venous: The inferior vena cava was not well visualized. The inferior vena cava is normal in size with greater than 50% respiratory variability, suggesting right atrial pressure of 3 mmHg. IAS/Shunts: No atrial level shunt detected by color flow Doppler. Additional Comments: A device lead is visualized.  LEFT VENTRICLE PLAX 2D LVIDd:         2.80 cm      Diastology LVIDs:         1.70 cm      LV e' medial:    10.30 cm/s LV PW:         1.10 cm      LV E/e' medial:  11.2 LV IVS:        1.10 cm      LV e' lateral:   8.05 cm/s LVOT diam:     1.70 cm      LV E/e' lateral: 14.3 LV SV:         67 LV SV Index:   27 LVOT Area:     2.27 cm  LV Volumes (MOD) LV vol d, MOD A2C: 180.0 ml LV vol d, MOD A4C: 141.0 ml LV vol s, MOD A2C: 55.3 ml LV vol s, MOD A4C: 41.6 ml LV SV MOD A2C:     124.7 ml LV SV MOD A4C:     141.0 ml LV SV MOD BP:      116.7 ml RIGHT VENTRICLE             IVC RV Basal diam:  3.85 cm     IVC diam: 1.30 cm RV S prime:     23.50 cm/s TAPSE (M-mode): 3.3 cm LEFT ATRIUM             Index        RIGHT ATRIUM           Index LA diam:        4.70 cm 1.89 cm/m   RA Area:     17.60 cm LA Vol (A2C):   99.7 ml 40.12 ml/m  RA Volume:   47.40 ml  19.07 ml/m LA Vol (A4C):   60.5 ml 24.34 ml/m LA Biplane Vol: 81.4 ml 32.75 ml/m  AORTIC VALVE                     PULMONIC VALVE AV Area (Vmax):    0.72 cm      PV Vmax:          0.90 m/s AV Area (Vmean):   0.66 cm      PV Peak grad:     3.2 mmHg AV Area (VTI):     0.76 cm      PR End Diast Vel: 1.99 msec AV Vmax:  458.00 cm/s AV Vmean:          319.000 cm/s AV VTI:            0.886 m AV Peak Grad:      83.9 mmHg AV Mean Grad:      46.0 mmHg LVOT Vmax:         145.00 cm/s LVOT Vmean:        93.050 cm/s LVOT VTI:          0.297 m LVOT/AV VTI ratio: 0.34  AORTA Ao Root diam: 2.30 cm MITRAL VALVE MV Area (PHT): 4.46 cm     SHUNTS MV Area VTI:   1.56 cm     Systemic VTI:  0.30 m MV Peak grad:  11.7 mmHg    Systemic Diam: 1.70 cm MV Mean grad:  5.0 mmHg MV Vmax:       1.71 m/s MV Vmean:      98.4 cm/s MV Decel Time: 170 msec MV E velocity: 115.00 cm/s MV A velocity: 167.00 cm/s MV E/A ratio:  0.69 Toribio Fuel MD Electronically signed by Toribio Fuel MD Signature Date/Time: 04/10/2024/3:46:19 PM    Final    CT HEAD WO CONTRAST Result Date: 04/10/2024 EXAM: CT HEAD WITHOUT CONTRAST 04/10/2024 06:58:57 AM TECHNIQUE: CT of the head was performed without the administration of intravenous contrast. Automated exposure control, iterative reconstruction, and/or weight based adjustment of the mA/kV was utilized to reduce the radiation dose to as low as reasonably achievable. COMPARISON: Head CT 12/05/2019. CLINICAL HISTORY: 83 year old male. Mental status change, unknown cause. History of leg laceration without fall, treated, discharged, and having confusion since. FINDINGS: BRAIN AND VENTRICLES: No acute hemorrhage. No evidence of acute infarct. No hydrocephalus. No extra-axial collection. No mass effect or midline shift. Cerebral volume is stable and within normal limits for age. 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 Otherwise gray white differentiation within normal limits for age. Calcified atherosclerosis at the skull base. No suspicious intracranial vascular hyperdensity. ORBITS: Postoperative changes to both orbits. SINUSES: Chronic retained secretions and or polyp extending from the posterior right nasal cavity into the nasopharynx, but considerably smaller since 2021. SOFT TISSUES AND SKULL: Mild motion  artifact at the skull base. No skull fracture. No acute scalp soft tissue injury identified. IMPRESSION: 1. No acute intracranial abnormality. Small chronic right cerebellar infarcts. Electronically signed by: Helayne Hurst MD 04/10/2024 07:10 AM EDT RP Workstation: [id number]   DG Chest Portable 1 View Result Date: 04/10/2024 EXAM: 1 VIEW(S) XRAY OF THE CHEST 04/10/2024 06:23:00 AM COMPARISON: Portable chest 04/28/2020, chest CT 12/25/2012. CLINICAL HISTORY: 83 year old male. Hypotension. Encounter for hypotension. 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 LUNGS AND PLEURA: Normal lung volumes. Allowing for portable technique both lungs appear clear. No focal pulmonary opacity. No pulmonary edema. No pleural effusion. No pneumothorax. HEART AND MEDIASTINUM: Previous TAVR. Heart size and mediastinal contours within normal limits. BONES AND SOFT TISSUES: No acute osseous abnormality. IMPRESSION: 1. No acute cardiopulmonary process. Electronically signed by: Helayne Hurst MD 04/10/2024 06:34 AM EDT RP Workstation: [id number]    Microbiology: Results for orders placed or performed during the hospital encounter of 04/10/24  Blood culture (routine x 2)     Status: None   Collection Time: 04/10/24  5:48 AM   Specimen: BLOOD RIGHT ARM  Result Value Ref Range Status   Specimen Description BLOOD RIGHT ARM  Final   Special Requests   Final  BOTTLES DRAWN AEROBIC AND ANAEROBIC Blood Culture adequate volume   Culture  Setup Time   Final    GRAM POSITIVE RODS AEROBIC BOTTLE ONLY CRITICAL RESULT CALLED TO, READ BACK BY AND VERIFIED WITH: [name] PHARMD 04/14/2024 @ 0457 BY AB    Culture   Final    GRAM POSITIVE RODS EGGERTHELLA CATENAFORMIS Standardized susceptibility testing for this organism is not available. Performed at [facility], [address] [place], [place] [address]    Report Status 04/17/2024 FINAL  Final  Blood culture (routine  x 2)     Status: None   Collection Time: 04/10/24  6:18 AM   Specimen: BLOOD  Result Value Ref Range Status   Specimen Description BLOOD SITE NOT SPECIFIED  Final   Special Requests   Final    BOTTLES DRAWN AEROBIC AND ANAEROBIC Blood Culture adequate volume   Culture   Final    NO GROWTH 5 DAYS Performed at [facility], [address], [place], [place] [address]    Report Status 04/15/2024 FINAL  Final  MRSA Next Gen by PCR, Nasal     Status: None   Collection Time: 04/10/24  9:42 AM   Specimen: Nasal Mucosa; Nasal Swab  Result Value Ref Range Status   MRSA by PCR Next Gen NOT DETECTED NOT DETECTED Final    Comment: (NOTE) The GeneXpert MRSA Assay (FDA approved for NASAL specimens only), is one component of a comprehensive MRSA colonization surveillance program. It is not intended to diagnose MRSA infection nor to guide or monitor treatment for MRSA infections. Test performance is not FDA approved in patients less than 67 years old. Performed at [facility], [address], [place], [place] [address]   Resp panel by RT-PCR (RSV, Flu A&B, Covid) Nasal Mucosa     Status: None   Collection Time: 04/10/24  9:42 AM   Specimen: Nasal Mucosa; Nasal Swab  Result Value Ref Range Status   SARS Coronavirus 2 by RT PCR NEGATIVE NEGATIVE Final   Influenza A by PCR NEGATIVE NEGATIVE Final   Influenza B by PCR NEGATIVE NEGATIVE Final    Comment: (NOTE) The Xpert Xpress SARS-CoV-2/FLU/RSV plus assay is intended as an aid in the diagnosis of influenza from Nasopharyngeal swab specimens and should not be used as a sole basis for treatment. Nasal washings and aspirates are unacceptable for Xpert Xpress SARS-CoV-2/FLU/RSV testing.  Fact Sheet for Patients: BloggerCourse.com  Fact Sheet for Healthcare Providers: SeriousBroker.it  This test is not yet approved or cleared by the [place]  FDA and has been authorized for  detection and/or diagnosis of SARS-CoV-2 by FDA under an Emergency Use Authorization (EUA). This EUA will remain in effect (meaning this test can be used) for the duration of the COVID-19 declaration under Section 564(b)(1) of the Act, 21 U.S.C. section 360bbb-3(b)(1), unless the authorization is terminated or revoked.     Resp Syncytial Virus by PCR NEGATIVE NEGATIVE Final    Comment: (NOTE) Fact Sheet for Patients: BloggerCourse.com  Fact Sheet for Healthcare Providers: SeriousBroker.it  This test is not yet approved or cleared by the [place]  FDA and has been authorized for detection and/or diagnosis of SARS-CoV-2 by FDA under an Emergency Use Authorization (EUA). This EUA will remain in effect (meaning this test can be used) for the duration of the COVID-19 declaration under Section 564(b)(1) of the Act, 21 U.S.C. section 360bbb-3(b)(1), unless the authorization is terminated or revoked.  Performed at [facility], [address] [place], [place] [address]   Culture, blood (Routine X 2) w Reflex to ID Panel     Status: None (Preliminary result)   Collection Time: 04/15/24  9:38 AM   Specimen: BLOOD LEFT ARM  Result Value Ref Range Status   Specimen Description BLOOD LEFT ARM  Final   Special Requests   Final    BOTTLES DRAWN AEROBIC AND ANAEROBIC Blood Culture adequate volume   Culture   Final    NO GROWTH 3 DAYS Performed at [name], [address] [place], [place] [address]    Report Status PENDING  Incomplete  Culture, blood (Routine X 2) w Reflex to ID Panel     Status: None (Preliminary result)   Collection Time: 04/15/24  9:51 AM   Specimen: BLOOD LEFT HAND  Result Value Ref Range Status   Specimen Description BLOOD LEFT HAND  Final   Special Requests   Final    BOTTLES DRAWN AEROBIC AND ANAEROBIC Blood Culture results may not be optimal due to an inadequate volume of blood received in  culture bottles   Culture   Final    NO GROWTH 3 DAYS Performed at [facility], [address], [place], [place] [address]    Report Status PENDING  Incomplete   *Note: Due to a large number of results and/or encounters for the requested time period, some results have not been displayed. A complete set of results can be found in Results Review.    Labs: CBC: Recent Labs  Lab 04/12/24 0244 04/13/24 0202 04/15/24 0330 04/16/24 0131 04/17/24 1113 04/18/24 0143  WBC 9.8 7.3 5.9 5.8 4.9 5.4  NEUTROABS 7.1  --   --   --   --   --   HGB 10.4* 9.6* 8.9* 8.1* 8.2* 8.0*  HCT 29.9* 27.3* 25.6* 23.4* 23.4* 23.0*  MCV 94.9 94.8 95.2 96.7 95.5 96.2  PLT 134* 105* 76* 77* 65* 65*   Basic Metabolic Panel: Recent Labs  Lab 04/12/24 0244 04/13/24 0202 04/14/24 0452 04/15/24 0330 04/16/24 0131 04/18/24 0143  NA 134* 135 138 137 136 138  K 4.0 3.3* 3.4* 3.6 3.6 3.7  CL 95* 100 103 103 103 106  CO2 29 25 25 24 24 23   GLUCOSE 143* 125* 113* 111* 105* 109*  BUN 45* 26* 12 5* 6* 6*  CREATININE 1.54* 1.01 0.84 0.68 0.69 0.68  CALCIUM  8.7* 8.5* 8.7* 8.4* 8.2* 8.0*  MG 2.0 1.7 1.7  --   --  1.3*   Liver Function Tests: No results for input(s): AST, ALT, ALKPHOS, BILITOT, PROT, ALBUMIN in the last 168 hours. CBG: Recent Labs  Lab 04/17/24 1704 04/17/24 2046 04/18/24 0754 04/18/24 1113 04/18/24 1722  GLUCAP 122* 116* 91 108* 106*    Discharge time spent: {LESS THAN/GREATER THAN:26388} 30 minutes.  Signed: Burnard DELENA Cunning, DO [facility] 04/18/2024

## 2024-04-20 LAB — CULTURE, BLOOD (ROUTINE X 2)
Culture: NO GROWTH
Culture: NO GROWTH
Special Requests: ADEQUATE

## 2024-04-21 ENCOUNTER — Telehealth: Payer: Self-pay

## 2024-04-21 NOTE — Telephone Encounter (Signed)
 Approved verbal orders given for PT. Once the clinician goes out for assessment, they will give us  a call with requested frequency

## 2024-04-21 NOTE — Telephone Encounter (Signed)
Yes, okay.

## 2024-04-21 NOTE — Telephone Encounter (Signed)
 Copied from CRM #[id number]. Topic: Clinical - Home Health Verbal Orders >> Apr 21, 2024 10:37 AM Roselie BROCKS wrote: Caller/Agency: [facility]  Callback Number: [phone] option 2 Service Requested: Physical Therapy Frequency: seeing patient for skilled nursing ,physical therapy Frontal scalp thinning with intact frontal hairline and miniaturization  He scheduled for Oct 23rd  Any new concerns about the patient? No

## 2024-04-25 ENCOUNTER — Telehealth: Payer: Self-pay

## 2024-04-25 NOTE — Telephone Encounter (Signed)
 Yes okay

## 2024-04-25 NOTE — Telephone Encounter (Signed)
 Copied from CRM [id number]. Topic: Clinical - Home Health Verbal Orders >> Apr 25, 2024  1:25 PM Mercedes MATSU wrote: Caller/Agency: [facility] Callback Number: [phone] secure line message can be left Service Requested: Physical Therapy Frequency: 1 time a week for 9 weeks, which will address strength and mobility. Any new concerns about the patient? No

## 2024-04-26 LAB — BASIC METABOLIC PANEL WITH GFR
BUN: 14 (ref 4–21)
CO2: 28 — AB (ref 13–22)
Chloride: 98 — AB (ref 99–108)
Creatinine: 0.7 (ref 0.6–1.3)
Glucose: 108
Potassium: 3.9 meq/L (ref 3.5–5.1)
Sodium: 132 — AB (ref 137–147)

## 2024-04-26 LAB — COMPREHENSIVE METABOLIC PANEL WITH GFR
Albumin: 2.7 — AB (ref 3.5–5.0)
Calcium: 9.1 (ref 8.7–10.7)
eGFR: 89

## 2024-04-26 LAB — HEPATIC FUNCTION PANEL
ALT: 22 U/L (ref 10–40)
AST: 40 (ref 14–40)
Alkaline Phosphatase: 84 (ref 25–125)
Bilirubin, Total: 0.7

## 2024-04-28 NOTE — Telephone Encounter (Signed)
 Left detailed message advising of approval of orders on secure voicemail. Office contact information provided as well.

## 2024-05-02 ENCOUNTER — Telehealth: Payer: Self-pay

## 2024-05-02 NOTE — Telephone Encounter (Signed)
 Forms faxed back.

## 2024-05-02 NOTE — Telephone Encounter (Signed)
 Home health orders received 10/31 for Tim [name] [facility] health initiation orders: Yes.  Home health re-certification orders: No. Patient last seen by ordering physician for this condition: 03/06/24. Must be less than 90 days for re-certification and less than 30 days prior for initiation. Visit must have been for the condition the orders are being placed.  Patient meets criteria for Physician to sign orders: Yes.        Current med list has been attached: Yes        Orders placed on physicians desk for signature: 10/31 (date) If patient does not meet criteria for orders to be signed: pt was called to schedule appt. Appt is scheduled for [name].    Placed on PCP desk to review and sign, if appropriate.   Ledford Goodson D Shirleymae Hauth

## 2024-05-02 NOTE — Telephone Encounter (Signed)
 Received fax from [facility] 04/24/24 to 06/22/24  Order # [id number]

## 2024-05-02 NOTE — Telephone Encounter (Signed)
 Signed and put in box to go up front. Signed:  Gerlene Hockey, MD           05/02/2024

## 2024-05-05 ENCOUNTER — Telehealth: Payer: Self-pay

## 2024-05-05 ENCOUNTER — Encounter: Payer: Self-pay | Admitting: Radiology

## 2024-05-05 NOTE — Telephone Encounter (Signed)
 Left detailed message with approved verbal orders along with our office phone and fax number

## 2024-05-05 NOTE — Telephone Encounter (Signed)
 Copied from CRM [id number]. Topic: Clinical - Home Health Verbal Orders >> May 05, 2024 11:46 AM Revonda BIRCH wrote: Caller/AgencyBETHA Saha with [facility]  Callback Number: [phone],dzrlmz line  Service Requested: Skilled Nursing Frequency: once a week for one week, every other week for 8 weeks Any new concerns about the patient? No   Okay for verbal orders?

## 2024-05-05 NOTE — Telephone Encounter (Signed)
Yes, okay.

## 2024-05-08 ENCOUNTER — Telehealth: Payer: Self-pay

## 2024-05-08 ENCOUNTER — Ambulatory Visit: Admitting: Diagnostic Neuroimaging

## 2024-05-08 NOTE — Transitions of Care (Post Inpatient/ED Visit) (Signed)
   05/08/2024  Name: Tim [name] MRN: [id number] DOB: Feb 16, 1941  Today's TOC FU Call Status: Today's TOC FU Call Status:: Unsuccessful Call (1st Attempt) Unsuccessful Call (1st Attempt) Date: 05/08/24  Attempted to reach the patient regarding the most recent Inpatient/ED visit.  Follow Up Plan: Additional outreach attempts will be made to reach the patient to complete the Transitions of Care (Post Inpatient/ED visit) call.   Alan Ee, RN, BSN, CEN [employer]- Transition of Care Team.  [facility] [phone]

## 2024-05-08 NOTE — Telephone Encounter (Signed)
 Yes okay

## 2024-05-08 NOTE — Telephone Encounter (Signed)
 Copied from CRM #[id number]. Topic: Clinical - Home Health Verbal Orders >> May 08, 2024 12:00 PM Thersia BROCKS wrote: Caller/Agency: [facility]  Callback Number: [phone] Service Requested: Skilled Nursing Frequency: 1 time a week for 6 weeks  Any new concerns about the patient? No , need okay from primary docotr take orders from the urologist  Okay for verbal orders? PCP initiated home health services

## 2024-05-08 NOTE — Transitions of Care (Post Inpatient/ED Visit) (Signed)
   05/08/2024  Name: Tim [name] MRN: [id number] DOB: 25-Aug-1940  Today's TOC FU Call Status: Today's TOC FU Call Status:: Unsuccessful Call (2nd Attempt) Unsuccessful Call (2nd Attempt) Date: 05/08/24  Attempted to reach the patient regarding the most recent Inpatient/ED visit.  Follow Up Plan: Additional outreach attempts will be made to reach the patient to complete the Transitions of Care (Post Inpatient/ED visit) call.   Alan Ee, RN, BSN, CEN [employer]- Transition of Care Team.  [facility] [phone]

## 2024-05-09 ENCOUNTER — Telehealth: Payer: Self-pay

## 2024-05-09 NOTE — Transitions of Care (Post Inpatient/ED Visit) (Signed)
   05/09/2024  Name: NEFI MUSICH MRN: [id number] DOB: Nov 09, 1940  Today's TOC FU Call Status: Today's TOC FU Call Status:: Unsuccessful Call (3rd Attempt) Unsuccessful Call (3rd Attempt) Date: 05/09/24  Attempted to reach the patient regarding the most recent Inpatient/ED visit.  Follow Up Plan: No further outreach attempts will be made at this time. We have been unable to contact the patient.  Alan Ee, RN, BSN, CEN [employer]- Transition of Care Team.  [facility] [phone]

## 2024-05-12 ENCOUNTER — Telehealth: Payer: Self-pay

## 2024-05-12 NOTE — Telephone Encounter (Signed)
 Home health orders received 05/12/24 for Tim [name] [name] health initiation orders: Yes.  Home health re-certification orders: No. Patient last seen by ordering physician for this condition: 03/06/24. Must be less than 90 days for re-certification and less than 30 days prior for initiation. Visit must have been for the condition the orders are being placed.  Patient meets criteria for Physician to sign orders: Yes.        Current med list has been attached: Yes        Orders placed on physicians desk for signature: 05/12/24 (date) If patient does not meet criteria for orders to be signed: pt was called to schedule appt. Appt is scheduled for [name].   [name] D Caidence Kaseman

## 2024-05-12 NOTE — Telephone Encounter (Signed)
 Rec'd fax from  [facility] regarding  verbal order given on 05/08/24 # [id number]  [place] inbox front office

## 2024-05-12 NOTE — Telephone Encounter (Signed)
 Copied from CRM [id number]. Topic: Clinical - Home Health Verbal Orders >> May 12, 2024  2:11 PM Burnard DEL wrote: Caller/Agency: [facility] [place] Number: [phone] Service Requested: Physical Therapy Frequency: 1wk6 Any new concerns about the patient? No   Okay for verba orders?

## 2024-05-13 ENCOUNTER — Telehealth: Payer: Self-pay

## 2024-05-13 NOTE — Telephone Encounter (Signed)
 Home health orders received 05/13/24 for [name] [facility] health initiation orders: Yes.  Home health re-certification orders: No. Patient last seen by ordering physician for this condition: 03/06/24. Must be less than 90 days for re-certification and less than 30 days prior for initiation. Visit must have been for the condition the orders are being placed.  Patient meets criteria for Physician to sign orders: Yes.        Current med list has been attached: No        Orders placed on physicians desk for signature: 05/13/24 (date) If patient does not meet criteria for orders to be signed: pt was called to schedule appt. Appt is scheduled for [name].   Placed on PCP desk to review and sign, if appropriate.   Tim [name]

## 2024-05-13 NOTE — Telephone Encounter (Signed)
 Yes okay

## 2024-05-13 NOTE — Telephone Encounter (Signed)
 Rec'd fax from  [facility] regarding  OT order # [id number]   [place] inbox front office

## 2024-05-13 NOTE — Telephone Encounter (Signed)
 Patient wife called regarding recent hsfu with [facility]. Was diagnosed with C diff and started oral vancomycin x10 days. Last dose is 11/12 afternoon.  Is not c/o loose watery stool. Is monitoring temp which has  been up to 99.5 x3. Will monitor for now.  Did ask if pt c/o increase diarrhea, fever, chills. To contact provider or visit ED for evaluation.  Would like to ask provider if stool testing will be needed after completing antibiotics or if blood needs to be ordered through [facility]. Also asking if pt is okay to stop vancomycin after last dose or if this should be extended.  HH rn will be coming this Thursday if labs are needed.  [id number] - [place], [place] - [address]  Scheduled to [place] FNP 11/24.

## 2024-05-13 NOTE — Telephone Encounter (Signed)
Yes, okay.

## 2024-05-13 NOTE — Telephone Encounter (Signed)
 Home health orders received 05/13/24 for [name] [facility] health initiation orders: Yes.  Home health re-certification orders: No. Patient last seen by ordering physician for this condition: 03/06/24. Must be less than 90 days for re-certification and less than 30 days prior for initiation. Visit must have been for the condition the orders are being placed.  Patient meets criteria for Physician to sign orders: Yes.        Current med list has been attached: No        Orders placed on physicians desk for signature: 05/13/24 (date) If patient does not meet criteria for orders to be signed: pt was called to schedule appt. Appt is scheduled for [name].   Placed on PCP desk to review and sign, if appropriate.   [name]

## 2024-05-13 NOTE — Telephone Encounter (Signed)
 Approved verbal orders left on secure VM for [name].

## 2024-05-13 NOTE — Telephone Encounter (Signed)
 Copied from CRM [id number]. Topic: Clinical - Home Health Verbal Orders >> May 13, 2024 11:22 AM Ahlexyia S wrote: Caller/Agency: Ray with [facility] Callback Number: [phone] Service Requested: Occupational Therapy Frequency: 1x week for 4 weeks effective yesterday Any new concerns about the patient? No

## 2024-05-13 NOTE — Telephone Encounter (Signed)
 Closing encounter, see other telephone note for approved and written HH orders.

## 2024-05-13 NOTE — Telephone Encounter (Signed)
 Signed and put in box to go up front. Signed:  Gerlene Hockey, MD           05/13/2024

## 2024-05-13 NOTE — Telephone Encounter (Signed)
 Rec'd fax from  [facility] regarding  verbal order given on 05/13/24 # [id number]   [place] inbox front office

## 2024-05-14 ENCOUNTER — Telehealth: Payer: Self-pay

## 2024-05-14 NOTE — Telephone Encounter (Signed)
 Copied from CRM [id number]. Topic: General - Other >> May 14, 2024  3:57 PM Mesmerise C wrote: Reason for CRM: Patient's wife wanted to report that he has been in hospital 3 times in 3 months, has home health and has therapy, has mobility problems for 6 months now states he's been housebound due to issues, inquiring since he's missed appointments will he be dropped as patient doesn't want to happen,has tried to make appointments but since due to his health has had trouble getting around wife can be reached at [phone]

## 2024-05-14 NOTE — Telephone Encounter (Signed)
 Pt's wife advised he will remain in our care, reassured he will not be dismissed due to health conditions.

## 2024-05-14 NOTE — Telephone Encounter (Signed)
 No I will definitely not drop him as a patient! Any missed appointments are certainly understood.

## 2024-05-15 ENCOUNTER — Telehealth: Payer: Self-pay

## 2024-05-15 NOTE — Telephone Encounter (Signed)
 Please review and advise.

## 2024-05-15 NOTE — Telephone Encounter (Signed)
 Copied from CRM #[id number]. Topic: Clinical - Home Health Verbal Orders >> May 15, 2024 10:49 AM Drema MATSU wrote: Caller/Agency: [facility] Callback Number: [phone] Service Requested: Skilled Nursing Frequency: decrease to 1 week 1 Any new concerns about the patient? No

## 2024-05-15 NOTE — Telephone Encounter (Signed)
 Spoke with [facility] regarding VO

## 2024-05-15 NOTE — Telephone Encounter (Signed)
 Copied from CRM [id number]. Topic: Clinical - Home Health Verbal Orders >> May 15, 2024  9:48 AM Berneda FALCON wrote: Caller/Agency: Lucie, RN from [facility] Callback Number: [phone] Service Requested: Skilled Nursing Frequency: Requesting decrease in frequency, nursing will discharge next week, but PT and OT stay. Currently 1 week 7, see him this Friday but discharge next week. Any new concerns about the patient? No

## 2024-05-15 NOTE — Telephone Encounter (Signed)
 Yes okay

## 2024-05-16 NOTE — Telephone Encounter (Signed)
 Rec'd fax from [name] regarding  verbal order # [id number]  McGowen inbox front office

## 2024-05-16 NOTE — Telephone Encounter (Signed)
Placed on PCP desk to review and sign, if appropriate.  

## 2024-05-17 ENCOUNTER — Other Ambulatory Visit: Payer: Self-pay | Admitting: Family Medicine

## 2024-05-19 ENCOUNTER — Telehealth: Payer: Self-pay

## 2024-05-19 NOTE — Telephone Encounter (Signed)
 Home health orders received 05/19/24 for [name] health initiation orders: No.  Home health re-certification orders: Yes. Patient last seen by ordering physician for this condition: 03/06/24. Must be less than 90 days for re-certification and less than 30 days prior for initiation. Visit must have been for the condition the orders are being placed.  Patient meets criteria for Physician to sign orders: No.        Current med list has been attached: No        Orders placed on physicians desk for signature: 05/19/24 (date) If patient does not meet criteria for orders to be signed: pt was called to schedule appt. Appt is scheduled for n/a.   Tim [name] Sharps

## 2024-05-19 NOTE — Telephone Encounter (Signed)
 Signed and put in box to go up front. Signed:  Gerlene Hockey, MD           05/19/2024

## 2024-05-26 ENCOUNTER — Ambulatory Visit: Payer: Self-pay | Admitting: Family

## 2024-05-27 ENCOUNTER — Telehealth: Payer: Self-pay

## 2024-05-27 NOTE — Telephone Encounter (Signed)
 Home health orders received 05/27/24 for [name] health initiation orders: Yes.  Home health re-certification orders: No. Patient last seen by ordering physician for this condition: 03/06/24. Must be less than 90 days for re-certification and less than 30 days prior for initiation. Visit must have been for the condition the orders are being placed.  Patient meets criteria for Physician to sign orders: Yes.        Current med list has been attached: No        Orders placed on physicians desk for signature: 05/27/24 (date) If patient does not meet criteria for orders to be signed: pt was called to schedule appt. Appt is scheduled for [name].   Placed on PCP desk to review and sign, if appropriate.   Nai Borromeo D Preciliano Castell

## 2024-05-27 NOTE — Telephone Encounter (Signed)
 Rec'd fax from [facility] regarding  Order # [id number]   McGowen inbox front office

## 2024-06-05 ENCOUNTER — Other Ambulatory Visit: Payer: Self-pay

## 2024-06-05 ENCOUNTER — Inpatient Hospital Stay (HOSPITAL_COMMUNITY)
Admission: EM | Admit: 2024-06-05 | Discharge: 2024-06-25 | DRG: 698 | Disposition: A | Discharge status: Hospice | Attending: Family Medicine | Admitting: Family Medicine

## 2024-06-05 ENCOUNTER — Encounter (HOSPITAL_COMMUNITY): Payer: Self-pay

## 2024-06-05 DIAGNOSIS — T826XXA Infection and inflammatory reaction due to cardiac valve prosthesis, initial encounter: Secondary | ICD-10-CM | POA: Diagnosis present

## 2024-06-05 DIAGNOSIS — Z91048 Other nonmedicinal substance allergy status: Secondary | ICD-10-CM

## 2024-06-05 DIAGNOSIS — Z515 Encounter for palliative care: Secondary | ICD-10-CM

## 2024-06-05 DIAGNOSIS — H9190 Unspecified hearing loss, unspecified ear: Secondary | ICD-10-CM | POA: Diagnosis present

## 2024-06-05 DIAGNOSIS — G4733 Obstructive sleep apnea (adult) (pediatric): Secondary | ICD-10-CM | POA: Diagnosis present

## 2024-06-05 DIAGNOSIS — Z89422 Acquired absence of other left toe(s): Secondary | ICD-10-CM

## 2024-06-05 DIAGNOSIS — Z7901 Long term (current) use of anticoagulants: Secondary | ICD-10-CM

## 2024-06-05 DIAGNOSIS — Z8711 Personal history of peptic ulcer disease: Secondary | ICD-10-CM

## 2024-06-05 DIAGNOSIS — I493 Ventricular premature depolarization: Secondary | ICD-10-CM | POA: Diagnosis present

## 2024-06-05 DIAGNOSIS — G894 Chronic pain syndrome: Secondary | ICD-10-CM | POA: Diagnosis present

## 2024-06-05 DIAGNOSIS — R310 Gross hematuria: Secondary | ICD-10-CM | POA: Diagnosis present

## 2024-06-05 DIAGNOSIS — E440 Moderate protein-calorie malnutrition: Secondary | ICD-10-CM | POA: Diagnosis present

## 2024-06-05 DIAGNOSIS — N179 Acute kidney failure, unspecified: Secondary | ICD-10-CM

## 2024-06-05 DIAGNOSIS — E871 Hypo-osmolality and hyponatremia: Secondary | ICD-10-CM | POA: Diagnosis present

## 2024-06-05 DIAGNOSIS — E876 Hypokalemia: Secondary | ICD-10-CM | POA: Diagnosis present

## 2024-06-05 DIAGNOSIS — Z882 Allergy status to sulfonamides status: Secondary | ICD-10-CM

## 2024-06-05 DIAGNOSIS — Y846 Urinary catheterization as the cause of abnormal reaction of the patient, or of later complication, without mention of misadventure at the time of the procedure: Secondary | ICD-10-CM | POA: Diagnosis present

## 2024-06-05 DIAGNOSIS — I252 Old myocardial infarction: Secondary | ICD-10-CM

## 2024-06-05 DIAGNOSIS — E8721 Acute metabolic acidosis: Secondary | ICD-10-CM | POA: Diagnosis present

## 2024-06-05 DIAGNOSIS — Z79891 Long term (current) use of opiate analgesic: Secondary | ICD-10-CM

## 2024-06-05 DIAGNOSIS — E7800 Pure hypercholesterolemia, unspecified: Secondary | ICD-10-CM | POA: Diagnosis present

## 2024-06-05 DIAGNOSIS — I2510 Atherosclerotic heart disease of native coronary artery without angina pectoris: Secondary | ICD-10-CM | POA: Diagnosis present

## 2024-06-05 DIAGNOSIS — E66811 Obesity, class 1: Secondary | ICD-10-CM | POA: Diagnosis present

## 2024-06-05 DIAGNOSIS — Z8249 Family history of ischemic heart disease and other diseases of the circulatory system: Secondary | ICD-10-CM

## 2024-06-05 DIAGNOSIS — Z66 Do not resuscitate: Secondary | ICD-10-CM | POA: Diagnosis present

## 2024-06-05 DIAGNOSIS — K7200 Acute and subacute hepatic failure without coma: Secondary | ICD-10-CM | POA: Diagnosis present

## 2024-06-05 DIAGNOSIS — J440 Chronic obstructive pulmonary disease with acute lower respiratory infection: Secondary | ICD-10-CM | POA: Diagnosis present

## 2024-06-05 DIAGNOSIS — G9340 Encephalopathy, unspecified: Secondary | ICD-10-CM | POA: Diagnosis present

## 2024-06-05 DIAGNOSIS — M545 Low back pain, unspecified: Secondary | ICD-10-CM | POA: Diagnosis present

## 2024-06-05 DIAGNOSIS — Z9049 Acquired absence of other specified parts of digestive tract: Secondary | ICD-10-CM

## 2024-06-05 DIAGNOSIS — I5042 Chronic combined systolic (congestive) and diastolic (congestive) heart failure: Secondary | ICD-10-CM | POA: Diagnosis present

## 2024-06-05 DIAGNOSIS — D65 Disseminated intravascular coagulation [defibrination syndrome]: Secondary | ICD-10-CM | POA: Diagnosis present

## 2024-06-05 DIAGNOSIS — L89153 Pressure ulcer of sacral region, stage 3: Secondary | ICD-10-CM | POA: Diagnosis present

## 2024-06-05 DIAGNOSIS — N400 Enlarged prostate without lower urinary tract symptoms: Secondary | ICD-10-CM | POA: Diagnosis present

## 2024-06-05 DIAGNOSIS — Z6832 Body mass index (BMI) 32.0-32.9, adult: Secondary | ICD-10-CM

## 2024-06-05 DIAGNOSIS — I495 Sick sinus syndrome: Secondary | ICD-10-CM | POA: Diagnosis present

## 2024-06-05 DIAGNOSIS — Z8679 Personal history of other diseases of the circulatory system: Secondary | ICD-10-CM

## 2024-06-05 DIAGNOSIS — R7401 Elevation of levels of liver transaminase levels: Secondary | ICD-10-CM

## 2024-06-05 DIAGNOSIS — F4322 Adjustment disorder with anxiety: Secondary | ICD-10-CM | POA: Diagnosis present

## 2024-06-05 DIAGNOSIS — F329 Major depressive disorder, single episode, unspecified: Secondary | ICD-10-CM | POA: Diagnosis present

## 2024-06-05 DIAGNOSIS — I255 Ischemic cardiomyopathy: Secondary | ICD-10-CM | POA: Diagnosis present

## 2024-06-05 DIAGNOSIS — Z95810 Presence of automatic (implantable) cardiac defibrillator: Secondary | ICD-10-CM

## 2024-06-05 DIAGNOSIS — R6521 Severe sepsis with septic shock: Secondary | ICD-10-CM | POA: Diagnosis present

## 2024-06-05 DIAGNOSIS — F32A Depression, unspecified: Secondary | ICD-10-CM | POA: Diagnosis present

## 2024-06-05 DIAGNOSIS — R54 Age-related physical debility: Secondary | ICD-10-CM | POA: Diagnosis present

## 2024-06-05 DIAGNOSIS — D72823 Leukemoid reaction: Secondary | ICD-10-CM | POA: Diagnosis present

## 2024-06-05 DIAGNOSIS — I110 Hypertensive heart disease with heart failure: Secondary | ICD-10-CM | POA: Diagnosis present

## 2024-06-05 DIAGNOSIS — D638 Anemia in other chronic diseases classified elsewhere: Secondary | ICD-10-CM | POA: Diagnosis present

## 2024-06-05 DIAGNOSIS — K3184 Gastroparesis: Secondary | ICD-10-CM | POA: Diagnosis not present

## 2024-06-05 DIAGNOSIS — L8990 Pressure ulcer of unspecified site, unspecified stage: Secondary | ICD-10-CM | POA: Diagnosis present

## 2024-06-05 DIAGNOSIS — J150 Pneumonia due to Klebsiella pneumoniae: Secondary | ICD-10-CM | POA: Diagnosis present

## 2024-06-05 DIAGNOSIS — A419 Sepsis, unspecified organism: Principal | ICD-10-CM | POA: Diagnosis present

## 2024-06-05 DIAGNOSIS — H409 Unspecified glaucoma: Secondary | ICD-10-CM | POA: Diagnosis present

## 2024-06-05 DIAGNOSIS — K567 Ileus, unspecified: Secondary | ICD-10-CM | POA: Diagnosis not present

## 2024-06-05 DIAGNOSIS — Y738 Miscellaneous gastroenterology and urology devices associated with adverse incidents, not elsewhere classified: Secondary | ICD-10-CM | POA: Diagnosis present

## 2024-06-05 DIAGNOSIS — Z59819 Housing instability, housed unspecified: Secondary | ICD-10-CM

## 2024-06-05 DIAGNOSIS — A4159 Other Gram-negative sepsis: Secondary | ICD-10-CM | POA: Diagnosis present

## 2024-06-05 DIAGNOSIS — Z1624 Resistance to multiple antibiotics: Secondary | ICD-10-CM | POA: Diagnosis present

## 2024-06-05 DIAGNOSIS — Z59868 Other specified financial insecurity: Secondary | ICD-10-CM

## 2024-06-05 DIAGNOSIS — E872 Acidosis, unspecified: Secondary | ICD-10-CM

## 2024-06-05 DIAGNOSIS — N4889 Other specified disorders of penis: Secondary | ICD-10-CM | POA: Diagnosis present

## 2024-06-05 DIAGNOSIS — N170 Acute kidney failure with tubular necrosis: Secondary | ICD-10-CM | POA: Diagnosis present

## 2024-06-05 DIAGNOSIS — T83021A Displacement of indwelling urethral catheter, initial encounter: Secondary | ICD-10-CM | POA: Diagnosis present

## 2024-06-05 DIAGNOSIS — Y831 Surgical operation with implant of artificial internal device as the cause of abnormal reaction of the patient, or of later complication, without mention of misadventure at the time of the procedure: Secondary | ICD-10-CM | POA: Diagnosis present

## 2024-06-05 DIAGNOSIS — Z7951 Long term (current) use of inhaled steroids: Secondary | ICD-10-CM

## 2024-06-05 DIAGNOSIS — G9341 Metabolic encephalopathy: Secondary | ICD-10-CM | POA: Diagnosis present

## 2024-06-05 DIAGNOSIS — Z881 Allergy status to other antibiotic agents status: Secondary | ICD-10-CM

## 2024-06-05 DIAGNOSIS — N99820 Postprocedural hemorrhage and hematoma of a genitourinary system organ or structure following a genitourinary system procedure: Secondary | ICD-10-CM | POA: Diagnosis present

## 2024-06-05 DIAGNOSIS — E1142 Type 2 diabetes mellitus with diabetic polyneuropathy: Secondary | ICD-10-CM | POA: Diagnosis present

## 2024-06-05 DIAGNOSIS — Q2112 Patent foramen ovale: Secondary | ICD-10-CM

## 2024-06-05 DIAGNOSIS — T83511A Infection and inflammatory reaction due to indwelling urethral catheter, initial encounter: Principal | ICD-10-CM | POA: Diagnosis present

## 2024-06-05 DIAGNOSIS — Z888 Allergy status to other drugs, medicaments and biological substances status: Secondary | ICD-10-CM

## 2024-06-05 DIAGNOSIS — F05 Delirium due to known physiological condition: Secondary | ICD-10-CM | POA: Diagnosis not present

## 2024-06-05 DIAGNOSIS — Z79899 Other long term (current) drug therapy: Secondary | ICD-10-CM

## 2024-06-05 DIAGNOSIS — M79605 Pain in left leg: Secondary | ICD-10-CM | POA: Diagnosis present

## 2024-06-05 DIAGNOSIS — Z885 Allergy status to narcotic agent status: Secondary | ICD-10-CM

## 2024-06-05 DIAGNOSIS — I480 Paroxysmal atrial fibrillation: Secondary | ICD-10-CM | POA: Diagnosis present

## 2024-06-05 DIAGNOSIS — N390 Urinary tract infection, site not specified: Secondary | ICD-10-CM | POA: Diagnosis present

## 2024-06-05 DIAGNOSIS — E1165 Type 2 diabetes mellitus with hyperglycemia: Secondary | ICD-10-CM | POA: Diagnosis present

## 2024-06-05 DIAGNOSIS — Z7982 Long term (current) use of aspirin: Secondary | ICD-10-CM

## 2024-06-05 DIAGNOSIS — Z792 Long term (current) use of antibiotics: Secondary | ICD-10-CM

## 2024-06-05 DIAGNOSIS — I442 Atrioventricular block, complete: Secondary | ICD-10-CM | POA: Diagnosis present

## 2024-06-05 DIAGNOSIS — Z8661 Personal history of infections of the central nervous system: Secondary | ICD-10-CM

## 2024-06-05 DIAGNOSIS — R45851 Suicidal ideations: Secondary | ICD-10-CM | POA: Diagnosis not present

## 2024-06-05 DIAGNOSIS — R5381 Other malaise: Secondary | ICD-10-CM | POA: Diagnosis present

## 2024-06-05 DIAGNOSIS — M79604 Pain in right leg: Secondary | ICD-10-CM | POA: Diagnosis present

## 2024-06-05 MED ORDER — SODIUM CHLORIDE 0.9 % IV SOLN: INTRAVENOUS | Status: DC

## 2024-06-05 MED ORDER — SODIUM CHLORIDE 0.9 % IV BOLUS (SEPSIS)
1000.0000 mL | Freq: Once | INTRAVENOUS | Status: AC
  Administered 2024-06-06: 1000 mL via INTRAVENOUS

## 2024-06-05 MED ORDER — SODIUM CHLORIDE 0.9 % IV SOLN
2.0000 g | Freq: Once | INTRAVENOUS | Status: AC
  Administered 2024-06-06: 2 g via INTRAVENOUS
  Filled 2024-06-05: qty 12.5

## 2024-06-05 NOTE — ED Provider Notes (Signed)
 [place] EMERGENCY DEPARTMENT AT [name] Provider Note   CSN: [id number] Arrival date & time: 06/05/24  2340     Patient presents with: Code Sepsis   Tim [name] is a 83 y.o. male.  {Add pertinent medical, surgical, social history, OB history to HPI:464} 83 year old male brought in by EMS from home, wife noted he was not acting like himself, checked his temp and it was elevated so she gave Tylenol  and called 911. EMS found patient to have 103.7 axillary temp.  Has indwelling foley with gross hematuria, unknown thinners. Last changed 1 week ago, reported to have blood at that time, unsure how long he has had blood in the Foley today. On PO vanc for c diff.         Prior to Admission medications   Medication Sig Start Date End Date Taking? Authorizing Provider  albuterol  (VENTOLIN  HFA) 108 (90 Base) MCG/ACT inhaler TAKE 2 PUFFS BY MOUTH EVERY 6 HOURS AS NEEDED FOR WHEEZE OR SHORTNESS OF BREATH 03/04/24   McGowen, Aleene DEL, MD  allopurinol  (ZYLOPRIM ) 300 MG tablet Take 1 tablet (300 mg total) by mouth every evening. 05/19/24   McGowen, Aleene DEL, MD  amLODipine (NORVASC) 5 MG tablet Take 5 mg by mouth in the morning and at bedtime. 03/28/24 06/26/24  [provider]  amoxicillin  (AMOXIL ) 500 MG capsule Take 2 capsules (1,000 mg total) by mouth 2 (two) times daily. 03/17/24   Fleeta Kathie Jomarie LOISE, MD  apixaban  (ELIQUIS ) 5 MG TABS tablet Take 5 mg by mouth 2 (two) times daily.    [provider]  ASPIRIN  LOW DOSE 81 MG EC tablet Take 81 mg by mouth daily. 02/03/19   [provider]  B Complex-C (B-COMPLEX WITH VITAMIN C ) tablet Take 1 tablet by mouth daily. Take 1 tablet daily, 250mg     [provider]  carvedilol (COREG) 3.125 MG tablet Take 3.125 mg by mouth 2 (two) times daily with a meal. 03/28/24 06/26/24  [provider]  clotrimazole -betamethasone  (LOTRISONE ) cream Apply 1 Application topically daily. 03/28/24 03/28/25   [provider]  docusate sodium  (COLACE) 100 MG capsule Take 1 capsule (100 mg total) by mouth 2 (two) times daily as needed for mild constipation. 04/18/24   Fausto Burnard LABOR, DO  ezetimibe  (ZETIA ) 10 MG tablet Take 1 tablet (10 mg total) by mouth daily. 12/16/19   McGowen, Aleene DEL, MD  ferrous gluconate  (FERGON) 324 MG tablet Take 1 tablet (324 mg total) by mouth 2 (two) times daily. 02/19/24   McGowen, Aleene DEL, MD  FIBER PO Take 5 capsules by mouth in the morning and at bedtime.    [provider]  finasteride  (PROSCAR ) 5 MG tablet Take 1 tablet (5 mg total) by mouth daily. 02/19/24   McGowen, Aleene DEL, MD  fluticasone  (FLONASE ) 50 MCG/ACT nasal spray Place 2 sprays into both nostrils daily. Patient taking differently: Place 2 sprays into both nostrils daily as needed for allergies. 03/06/24   McGowen, Aleene DEL, MD  fluticasone -salmeterol (WIXELA INHUB) 250-50 MCG/ACT AEPB Inhale 1 puff into the lungs 2 (two) times daily. in the morning and at bedtime. 12/12/23   McGowen, Aleene DEL, MD  gabapentin  (NEURONTIN ) 800 MG tablet Take 1 tablet (800 mg total) by mouth 2 (two) times daily. 04/18/24   Fausto Burnard LABOR, DO  loratadine (CLARITIN) 10 MG tablet Take 10 mg by mouth daily.    [provider]  losartan  (COZAAR ) 25 MG tablet Take 25  mg by mouth daily. 03/29/24 06/27/24  [provider]  mometasone -formoterol  (DULERA ) 100-5 MCG/ACT AERO Inhale 2 puffs into the lungs as needed for wheezing or shortness of breath.    [provider]  Multiple Vitamin (MULTIVITAMIN ADULT PO) Take 2 tablets by mouth daily.    [provider]  naloxone  (NARCAN ) nasal spray 4 mg/0.1 mL One spray in nostril as needed for opoid overdose.  May repeat dose in 5 min if needed. Patient taking differently: Place 0.4 mg into the nose as needed (OD). 06/08/23   McGowen, Aleene DEL, MD  niacin  (NIASPAN ) 1000 MG CR tablet TAKE 1 TABLET BY MOUTH TWICE A DAY 03/17/24   McGowen, Aleene DEL,  MD  nitroGLYCERIN  (NITROSTAT ) 0.4 MG SL tablet Place 0.4 mg under the tongue every 5 (five) minutes as needed for chest pain. 09/04/17   [provider]  nystatin  (MYCOSTATIN /NYSTOP ) powder Apply topically 2 (two) times daily. 04/18/24   Fausto Sor A, DO  Oxycodone  HCl 20 MG TABS 1 tab po bid prn severe pain Patient taking differently: Take 20 mg by mouth daily. 03/21/24   McGowen, Aleene DEL, MD  pantoprazole  (PROTONIX ) 40 MG tablet Take 1 tablet (40 mg total) by mouth daily. Patient taking differently: Take 40 mg by mouth 2 (two) times daily. 09/01/22   McGowen, Aleene DEL, MD  potassium chloride  SA (KLOR-CON  M) 20 MEQ tablet Take 0.5 tablets (10 mEq total) by mouth 2 (two) times daily. 05/19/24   McGowen, Aleene DEL, MD  ranolazine  (RANEXA ) 500 MG 12 hr tablet Take 500 mg by mouth 2 (two) times daily. 03/28/24 06/26/24  [provider]  rosuvastatin  (CRESTOR ) 20 MG tablet Take 20 mg by mouth daily. 10/09/22   [provider]  torsemide  (DEMADEX ) 20 MG tablet Take 1 tablet (20 mg total) by mouth daily as needed (weight gain or swelling). 04/18/24   Fausto Sor A, DO  VITAMIN D, CHOLECALCIFEROL, PO Take 1 tablet by mouth daily.     [provider]    Allergies: Brimonidine tartrate, Brinzolamide, Latanoprost, Nucynta  [tapentadol ], Sulfa antibiotics, Timolol maleate, Diltiazem, Rofecoxib, Vancomycin , Codeine, Tamsulosin, Celecoxib, Colchicine, and Tape    Review of Systems Level 5 caveat for change in mental status  Updated Vital Signs BP (!) 158/143   Pulse (!) 123   Resp (!) 21   Ht 6' 4 (1.93 m)   Wt 115 kg   SpO2 93%   BMI 30.86 kg/m   Physical Exam Vitals and nursing note reviewed.  Constitutional:      General: He is not in acute distress.    Appearance: He is well-developed. He is obese. He is ill-appearing. He is not diaphoretic.  HENT:     Head: Normocephalic and atraumatic.     Mouth/Throat:     Mouth: Mucous membranes are dry.   Cardiovascular:     Rate and Rhythm: Tachycardia present. Rhythm irregular.     Heart sounds: Normal heart sounds.  Pulmonary:     Effort: Pulmonary effort is normal.     Breath sounds: Normal breath sounds.  Abdominal:     Palpations: Abdomen is soft.     Tenderness: There is no abdominal tenderness.  Musculoskeletal:     Right lower leg: Edema present.     Left lower leg: Edema present.     Comments: Left mid foot amputation. Left DP, right PT pulses present by doppler at bedside  Skin:    General: Skin is warm and dry.  Neurological:  Comments: Mumbles to voice, follow very simple commands (move fingers)     (all labs ordered are listed, but only abnormal results are displayed) Labs Reviewed  RESP PANEL BY RT-PCR (RSV, FLU A&B, COVID)  [id number]  CULTURE, BLOOD (ROUTINE X 2)  CULTURE, BLOOD (ROUTINE X 2)  COMPREHENSIVE METABOLIC PANEL WITH GFR  CBC WITH DIFFERENTIAL/PLATELET  PROTIME-INR  URINALYSIS, W/ REFLEX TO CULTURE (INFECTION SUSPECTED)  I-STAT CG4 LACTIC ACID, ED    EKG: EKG Interpretation Date/Time:  Thursday June 05 2024 23:47:11 EST Ventricular Rate:  115 PR Interval:  117 QRS Duration:  134 QT Interval:  344 QTC Calculation: 476 R Axis:   -84  Text Interpretation: Paced rhythm Multiple ventricular premature complexes   Confirmed by Bari Pfeiffer ([id number]) on 06/05/2024 11:56:01 PM  Radiology: No results found.  {Document cardiac monitor, telemetry assessment procedure when appropriate:32947} Procedures   Medications Ordered in the ED  ceFEPIme  (MAXIPIME ) 2 g in sodium chloride  0.9 % 100 mL IVPB (has no administration in time range)  0.9 %  sodium chloride  infusion (has no administration in time range)  sodium chloride  0.9 % bolus 1,000 mL (has no administration in time range)      {Click here for ABCD2, HEART and other calculators REFRESH Note before signing:1}                              Medical Decision Making Amount and/or  Complexity of Data Reviewed Labs: ordered. Radiology: ordered.  Risk Prescription drug management.   This patient presents to the ED for concern of ***, this involves an extensive number of treatment options, and is a complaint that carries with it a high risk of complications and morbidity.  The differential diagnosis includes ***   Co morbidities / Chronic conditions that complicate the patient evaluation  ***   Additional history obtained:  Additional history obtained from EMR External records from outside source obtained and reviewed including ***   Lab Tests:  I Ordered, and personally interpreted labs.  The pertinent results include:  ***   Imaging Studies ordered:  I ordered imaging studies including ***  I independently visualized and interpreted imaging which showed *** I agree with the radiologist interpretation   Cardiac Monitoring: / EKG:  The patient was maintained on a cardiac monitor.  I personally viewed and interpreted the cardiac monitored which showed an underlying rhythm of: ***   Problem List / ED Course / Critical interventions / Medication management  *** I ordered medication including ***   Reevaluation of the patient after these medicines showed that the patient *** I have reviewed the patients home medicines and have made adjustments as needed   Consultations Obtained:  I requested consultation with the ***,  and discussed lab and imaging findings as well as pertinent plan - they recommend: ***   Social Determinants of Health:  ***   Test / Admission - Considered:  ***   {Document critical care time when appropriate  Document review of labs and clinical decision tools ie CHADS2VASC2, etc  Document your independent review of radiology images and any outside records  Document your discussion with family members, caretakers and with consultants  Document social determinants of health affecting pt's care  Document your decision  making why or why not admission, treatments were needed:32947:::1}   Final diagnoses:  None    ED Discharge Orders     None

## 2024-06-05 NOTE — ED Triage Notes (Signed)
 Pt spo2 dropped to 70% on ra. Pt placved on oxygen 4l 96%

## 2024-06-05 NOTE — ED Triage Notes (Signed)
 Pt coming in from home where he was not asking like himself wife took his temperature it was high so she gave tylonol. Ems temp 103.7 axillary.  750 saline 20 lwrist  Pt has blood in his catheter  Pt wife has c diff.  Ems vitals  Bp 140/100 Hr 130  Spo2 98% 6l

## 2024-06-06 ENCOUNTER — Inpatient Hospital Stay (HOSPITAL_COMMUNITY)

## 2024-06-06 ENCOUNTER — Emergency Department (HOSPITAL_COMMUNITY)

## 2024-06-06 DIAGNOSIS — N170 Acute kidney failure with tubular necrosis: Secondary | ICD-10-CM | POA: Diagnosis present

## 2024-06-06 DIAGNOSIS — G9340 Encephalopathy, unspecified: Secondary | ICD-10-CM | POA: Diagnosis not present

## 2024-06-06 DIAGNOSIS — T826XXA Infection and inflammatory reaction due to cardiac valve prosthesis, initial encounter: Secondary | ICD-10-CM | POA: Diagnosis present

## 2024-06-06 DIAGNOSIS — T83511A Infection and inflammatory reaction due to indwelling urethral catheter, initial encounter: Secondary | ICD-10-CM | POA: Diagnosis present

## 2024-06-06 DIAGNOSIS — R609 Edema, unspecified: Secondary | ICD-10-CM | POA: Diagnosis not present

## 2024-06-06 DIAGNOSIS — J150 Pneumonia due to Klebsiella pneumoniae: Secondary | ICD-10-CM | POA: Diagnosis present

## 2024-06-06 DIAGNOSIS — E872 Acidosis, unspecified: Secondary | ICD-10-CM | POA: Diagnosis not present

## 2024-06-06 DIAGNOSIS — E1165 Type 2 diabetes mellitus with hyperglycemia: Secondary | ICD-10-CM | POA: Diagnosis present

## 2024-06-06 DIAGNOSIS — I4891 Unspecified atrial fibrillation: Secondary | ICD-10-CM | POA: Diagnosis not present

## 2024-06-06 DIAGNOSIS — L89153 Pressure ulcer of sacral region, stage 3: Secondary | ICD-10-CM | POA: Diagnosis present

## 2024-06-06 DIAGNOSIS — F329 Major depressive disorder, single episode, unspecified: Secondary | ICD-10-CM | POA: Diagnosis not present

## 2024-06-06 DIAGNOSIS — D649 Anemia, unspecified: Secondary | ICD-10-CM | POA: Diagnosis not present

## 2024-06-06 DIAGNOSIS — I110 Hypertensive heart disease with heart failure: Secondary | ICD-10-CM | POA: Diagnosis present

## 2024-06-06 DIAGNOSIS — N179 Acute kidney failure, unspecified: Secondary | ICD-10-CM | POA: Diagnosis not present

## 2024-06-06 DIAGNOSIS — I509 Heart failure, unspecified: Secondary | ICD-10-CM | POA: Diagnosis not present

## 2024-06-06 DIAGNOSIS — D638 Anemia in other chronic diseases classified elsewhere: Secondary | ICD-10-CM | POA: Diagnosis present

## 2024-06-06 DIAGNOSIS — F4329 Adjustment disorder with other symptoms: Secondary | ICD-10-CM | POA: Diagnosis not present

## 2024-06-06 DIAGNOSIS — A4159 Other Gram-negative sepsis: Secondary | ICD-10-CM | POA: Diagnosis present

## 2024-06-06 DIAGNOSIS — G9341 Metabolic encephalopathy: Secondary | ICD-10-CM | POA: Diagnosis present

## 2024-06-06 DIAGNOSIS — Z1612 Extended spectrum beta lactamase (ESBL) resistance: Secondary | ICD-10-CM | POA: Diagnosis not present

## 2024-06-06 DIAGNOSIS — A419 Sepsis, unspecified organism: Secondary | ICD-10-CM | POA: Diagnosis present

## 2024-06-06 DIAGNOSIS — E66811 Obesity, class 1: Secondary | ICD-10-CM | POA: Diagnosis present

## 2024-06-06 DIAGNOSIS — K7200 Acute and subacute hepatic failure without coma: Secondary | ICD-10-CM | POA: Diagnosis present

## 2024-06-06 DIAGNOSIS — Z7901 Long term (current) use of anticoagulants: Secondary | ICD-10-CM | POA: Diagnosis not present

## 2024-06-06 DIAGNOSIS — Z515 Encounter for palliative care: Secondary | ICD-10-CM | POA: Diagnosis not present

## 2024-06-06 DIAGNOSIS — E440 Moderate protein-calorie malnutrition: Secondary | ICD-10-CM | POA: Diagnosis present

## 2024-06-06 DIAGNOSIS — Y846 Urinary catheterization as the cause of abnormal reaction of the patient, or of later complication, without mention of misadventure at the time of the procedure: Secondary | ICD-10-CM | POA: Diagnosis present

## 2024-06-06 DIAGNOSIS — R5381 Other malaise: Secondary | ICD-10-CM | POA: Diagnosis not present

## 2024-06-06 DIAGNOSIS — N390 Urinary tract infection, site not specified: Secondary | ICD-10-CM | POA: Diagnosis present

## 2024-06-06 DIAGNOSIS — N99820 Postprocedural hemorrhage and hematoma of a genitourinary system organ or structure following a genitourinary system procedure: Secondary | ICD-10-CM | POA: Diagnosis present

## 2024-06-06 DIAGNOSIS — M7989 Other specified soft tissue disorders: Secondary | ICD-10-CM | POA: Diagnosis not present

## 2024-06-06 DIAGNOSIS — I442 Atrioventricular block, complete: Secondary | ICD-10-CM | POA: Diagnosis present

## 2024-06-06 DIAGNOSIS — Z7189 Other specified counseling: Secondary | ICD-10-CM | POA: Diagnosis not present

## 2024-06-06 DIAGNOSIS — Y831 Surgical operation with implant of artificial internal device as the cause of abnormal reaction of the patient, or of later complication, without mention of misadventure at the time of the procedure: Secondary | ICD-10-CM | POA: Diagnosis present

## 2024-06-06 DIAGNOSIS — D65 Disseminated intravascular coagulation [defibrination syndrome]: Secondary | ICD-10-CM | POA: Diagnosis present

## 2024-06-06 DIAGNOSIS — Y738 Miscellaneous gastroenterology and urology devices associated with adverse incidents, not elsewhere classified: Secondary | ICD-10-CM | POA: Diagnosis present

## 2024-06-06 DIAGNOSIS — F22 Delusional disorders: Secondary | ICD-10-CM | POA: Diagnosis not present

## 2024-06-06 DIAGNOSIS — Z960 Presence of urogenital implants: Secondary | ICD-10-CM | POA: Diagnosis not present

## 2024-06-06 DIAGNOSIS — E8770 Fluid overload, unspecified: Secondary | ICD-10-CM | POA: Diagnosis not present

## 2024-06-06 DIAGNOSIS — D696 Thrombocytopenia, unspecified: Secondary | ICD-10-CM | POA: Diagnosis not present

## 2024-06-06 DIAGNOSIS — R6521 Severe sepsis with septic shock: Secondary | ICD-10-CM | POA: Diagnosis present

## 2024-06-06 DIAGNOSIS — R7401 Elevation of levels of liver transaminase levels: Secondary | ICD-10-CM | POA: Diagnosis not present

## 2024-06-06 DIAGNOSIS — J449 Chronic obstructive pulmonary disease, unspecified: Secondary | ICD-10-CM | POA: Diagnosis not present

## 2024-06-06 DIAGNOSIS — N401 Enlarged prostate with lower urinary tract symptoms: Secondary | ICD-10-CM | POA: Diagnosis not present

## 2024-06-06 DIAGNOSIS — F419 Anxiety disorder, unspecified: Secondary | ICD-10-CM | POA: Diagnosis not present

## 2024-06-06 DIAGNOSIS — E8721 Acute metabolic acidosis: Secondary | ICD-10-CM | POA: Diagnosis present

## 2024-06-06 DIAGNOSIS — J440 Chronic obstructive pulmonary disease with acute lower respiratory infection: Secondary | ICD-10-CM | POA: Diagnosis present

## 2024-06-06 DIAGNOSIS — I480 Paroxysmal atrial fibrillation: Secondary | ICD-10-CM | POA: Diagnosis not present

## 2024-06-06 DIAGNOSIS — I5042 Chronic combined systolic (congestive) and diastolic (congestive) heart failure: Secondary | ICD-10-CM | POA: Diagnosis present

## 2024-06-06 DIAGNOSIS — Z66 Do not resuscitate: Secondary | ICD-10-CM | POA: Diagnosis present

## 2024-06-06 DIAGNOSIS — E119 Type 2 diabetes mellitus without complications: Secondary | ICD-10-CM | POA: Diagnosis not present

## 2024-06-06 LAB — BASIC METABOLIC PANEL WITH GFR
Anion gap: 11 (ref 5–15)
BUN: 21 mg/dL (ref 8–23)
CO2: 19 mmol/L — ABNORMAL LOW (ref 22–32)
Calcium: 7.8 mg/dL — ABNORMAL LOW (ref 8.9–10.3)
Chloride: 105 mmol/L (ref 98–111)
Creatinine, Ser: 1.37 mg/dL — ABNORMAL HIGH (ref 0.61–1.24)
GFR, Estimated: 51 mL/min — ABNORMAL LOW (ref >60)
Glucose, Bld: 188 mg/dL — ABNORMAL HIGH (ref 70–99)
Potassium: 4.4 mmol/L (ref 3.5–5.1)
Sodium: 135 mmol/L (ref 135–145)

## 2024-06-06 LAB — CBC WITH DIFFERENTIAL/PLATELET
Abs Immature Granulocytes: 0.7 K/uL — ABNORMAL HIGH (ref 0.00–0.07)
Band Neutrophils: 17 %
Basophils Absolute: 0 K/uL (ref 0.0–0.1)
Basophils Relative: 0 %
Eosinophils Absolute: 0 K/uL (ref 0.0–0.5)
Eosinophils Relative: 0 %
HCT: 39.1 % (ref 39.0–52.0)
Hemoglobin: 12.9 g/dL — ABNORMAL LOW (ref 13.0–17.0)
Lymphocytes Relative: 2 %
Lymphs Abs: 0.1 K/uL — ABNORMAL LOW (ref 0.7–4.0)
MCH: 32.5 pg (ref 26.0–34.0)
MCHC: 33 g/dL (ref 30.0–36.0)
MCV: 98.5 fL (ref 80.0–100.0)
Metamyelocytes Relative: 9 %
Monocytes Absolute: 0.1 K/uL (ref 0.1–1.0)
Monocytes Relative: 1 %
Myelocytes: 1 %
Neutro Abs: 5.8 K/uL (ref 1.7–7.7)
Neutrophils Relative %: 70 %
Platelets: 61 K/uL — ABNORMAL LOW (ref 150–400)
RBC: 3.97 MIL/uL — ABNORMAL LOW (ref 4.22–5.81)
RDW: 15.4 % (ref 11.5–15.5)
WBC: 6.7 K/uL (ref 4.0–10.5)
nRBC: 0.3 % — ABNORMAL HIGH (ref 0.0–0.2)

## 2024-06-06 LAB — BLOOD CULTURE ID PANEL (REFLEXED) - BCID2
A.calcoaceticus-baumannii: NOT DETECTED
Bacteroides fragilis: NOT DETECTED
CTX-M ESBL: DETECTED — AB
Candida albicans: NOT DETECTED
Candida auris: NOT DETECTED
Candida glabrata: NOT DETECTED
Candida krusei: NOT DETECTED
Candida parapsilosis: NOT DETECTED
Candida tropicalis: NOT DETECTED
Carbapenem resist OXA 48 LIKE: NOT DETECTED
Carbapenem resistance IMP: NOT DETECTED
Carbapenem resistance KPC: NOT DETECTED
Carbapenem resistance NDM: NOT DETECTED
Carbapenem resistance VIM: NOT DETECTED
Cryptococcus neoformans/gattii: NOT DETECTED
Enterobacter cloacae complex: NOT DETECTED
Enterobacterales: DETECTED — AB
Enterococcus Faecium: NOT DETECTED
Enterococcus faecalis: NOT DETECTED
Escherichia coli: DETECTED — AB
Haemophilus influenzae: NOT DETECTED
Klebsiella aerogenes: NOT DETECTED
Klebsiella oxytoca: NOT DETECTED
Klebsiella pneumoniae: DETECTED — AB
Listeria monocytogenes: NOT DETECTED
Neisseria meningitidis: NOT DETECTED
Proteus species: NOT DETECTED
Pseudomonas aeruginosa: NOT DETECTED
Salmonella species: NOT DETECTED
Serratia marcescens: NOT DETECTED
Staphylococcus aureus (BCID): NOT DETECTED
Staphylococcus epidermidis: NOT DETECTED
Staphylococcus lugdunensis: NOT DETECTED
Staphylococcus species: NOT DETECTED
Stenotrophomonas maltophilia: NOT DETECTED
Streptococcus agalactiae: NOT DETECTED
Streptococcus pneumoniae: NOT DETECTED
Streptococcus pyogenes: NOT DETECTED
Streptococcus species: NOT DETECTED

## 2024-06-06 LAB — CBC
HCT: 34.3 % — ABNORMAL LOW (ref 39.0–52.0)
HCT: 35 % — ABNORMAL LOW (ref 39.0–52.0)
HCT: 37.1 % — ABNORMAL LOW (ref 39.0–52.0)
Hemoglobin: 11.4 g/dL — ABNORMAL LOW (ref 13.0–17.0)
Hemoglobin: 11.9 g/dL — ABNORMAL LOW (ref 13.0–17.0)
Hemoglobin: 12.6 g/dL — ABNORMAL LOW (ref 13.0–17.0)
MCH: 32.2 pg (ref 26.0–34.0)
MCH: 33.1 pg (ref 26.0–34.0)
MCH: 32.7 pg (ref 26.0–34.0)
MCHC: 33.2 g/dL (ref 30.0–36.0)
MCHC: 34 g/dL (ref 30.0–36.0)
MCHC: 34 g/dL (ref 30.0–36.0)
MCV: 96.9 fL (ref 80.0–100.0)
MCV: 97.5 fL (ref 80.0–100.0)
MCV: 96.4 fL (ref 80.0–100.0)
Platelets: 61 K/uL — ABNORMAL LOW (ref 150–400)
Platelets: 65 K/uL — ABNORMAL LOW (ref 150–400)
Platelets: 71 K/uL — ABNORMAL LOW (ref 150–400)
RBC: 3.54 MIL/uL — ABNORMAL LOW (ref 4.22–5.81)
RBC: 3.59 MIL/uL — ABNORMAL LOW (ref 4.22–5.81)
RBC: 3.85 MIL/uL — ABNORMAL LOW (ref 4.22–5.81)
RDW: 15.5 % (ref 11.5–15.5)
RDW: 15.5 % (ref 11.5–15.5)
RDW: 15.4 % (ref 11.5–15.5)
WBC: 29.9 K/uL — ABNORMAL HIGH (ref 4.0–10.5)
WBC: 35.2 K/uL — ABNORMAL HIGH (ref 4.0–10.5)
WBC: 44.1 K/uL — ABNORMAL HIGH (ref 4.0–10.5)
nRBC: 0.1 % (ref 0.0–0.2)
nRBC: 0 % (ref 0.0–0.2)
nRBC: 0 % (ref 0.0–0.2)

## 2024-06-06 LAB — URINALYSIS, W/ REFLEX TO CULTURE (INFECTION SUSPECTED)
RBC / HPF: >50 RBC/hpf (ref 0–5)
WBC, UA: >50 WBC/hpf (ref 0–5)

## 2024-06-06 LAB — COMPREHENSIVE METABOLIC PANEL WITH GFR
ALT: 80 U/L — ABNORMAL HIGH (ref 0–44)
ALT: 121 U/L — ABNORMAL HIGH (ref 0–44)
AST: 325 U/L — ABNORMAL HIGH (ref 15–41)
AST: 504 U/L — ABNORMAL HIGH (ref 15–41)
Albumin: 2.5 g/dL — ABNORMAL LOW (ref 3.5–5.0)
Albumin: 2.1 g/dL — ABNORMAL LOW (ref 3.5–5.0)
Alkaline Phosphatase: 144 U/L — ABNORMAL HIGH (ref 38–126)
Alkaline Phosphatase: 95 U/L (ref 38–126)
Anion gap: 10 (ref 5–15)
Anion gap: 8 (ref 5–15)
BUN: 15 mg/dL (ref 8–23)
BUN: 18 mg/dL (ref 8–23)
CO2: 24 mmol/L (ref 22–32)
CO2: 21 mmol/L — ABNORMAL LOW (ref 22–32)
Calcium: 8.1 mg/dL — ABNORMAL LOW (ref 8.9–10.3)
Calcium: 7.6 mg/dL — ABNORMAL LOW (ref 8.9–10.3)
Chloride: 103 mmol/L (ref 98–111)
Chloride: 107 mmol/L (ref 98–111)
Creatinine, Ser: 1.47 mg/dL — ABNORMAL HIGH (ref 0.61–1.24)
Creatinine, Ser: 1.42 mg/dL — ABNORMAL HIGH (ref 0.61–1.24)
GFR, Estimated: 47 mL/min — ABNORMAL LOW (ref >60)
GFR, Estimated: 49 mL/min — ABNORMAL LOW (ref >60)
Glucose, Bld: 109 mg/dL — ABNORMAL HIGH (ref 70–99)
Glucose, Bld: 126 mg/dL — ABNORMAL HIGH (ref 70–99)
Potassium: 3.8 mmol/L (ref 3.5–5.1)
Potassium: 3.5 mmol/L (ref 3.5–5.1)
Sodium: 137 mmol/L (ref 135–145)
Sodium: 136 mmol/L (ref 135–145)
Total Bilirubin: 1.7 mg/dL — ABNORMAL HIGH (ref 0.0–1.2)
Total Bilirubin: 1.3 mg/dL — ABNORMAL HIGH (ref 0.0–1.2)
Total Protein: 5.3 g/dL — ABNORMAL LOW (ref 6.5–8.1)
Total Protein: 4.4 g/dL — ABNORMAL LOW (ref 6.5–8.1)

## 2024-06-06 LAB — I-STAT CHEM 8, ED
BUN: 18 mg/dL (ref 8–23)
Calcium, Ion: 1.12 mmol/L — ABNORMAL LOW (ref 1.15–1.40)
Chloride: 100 mmol/L (ref 98–111)
Creatinine, Ser: 1.2 mg/dL (ref 0.61–1.24)
Glucose, Bld: 104 mg/dL — ABNORMAL HIGH (ref 70–99)
HCT: 40 % (ref 39.0–52.0)
Hemoglobin: 13.6 g/dL (ref 13.0–17.0)
Potassium: 4 mmol/L (ref 3.5–5.1)
Sodium: 137 mmol/L (ref 135–145)
TCO2: 23 mmol/L (ref 22–32)

## 2024-06-06 LAB — RESP PANEL BY RT-PCR (RSV, FLU A&B, COVID)  RVPGX2
Influenza A by PCR: NEGATIVE
Influenza B by PCR: NEGATIVE
Resp Syncytial Virus by PCR: NEGATIVE
SARS Coronavirus 2 by RT PCR: NEGATIVE

## 2024-06-06 LAB — COOXEMETRY PANEL
Carboxyhemoglobin: 2 % — ABNORMAL HIGH (ref 0.5–1.5)
Methemoglobin: 0.9 % (ref 0.0–1.5)
O2 Saturation: 77.9 %
Total hemoglobin: 12 g/dL (ref 12.0–16.0)

## 2024-06-06 LAB — POCT I-STAT 7, (LYTES, BLD GAS, ICA,H+H)
Acid-base deficit: 5 mmol/L — ABNORMAL HIGH (ref 0.0–2.0)
Bicarbonate: 19 mmol/L — ABNORMAL LOW (ref 20.0–28.0)
Calcium, Ion: 1.17 mmol/L (ref 1.15–1.40)
HCT: 35 % — ABNORMAL LOW (ref 39.0–52.0)
Hemoglobin: 11.9 g/dL — ABNORMAL LOW (ref 13.0–17.0)
O2 Saturation: 97 %
Patient temperature: 37
Potassium: 3.7 mmol/L (ref 3.5–5.1)
Sodium: 136 mmol/L (ref 135–145)
TCO2: 20 mmol/L — ABNORMAL LOW (ref 22–32)
pCO2 arterial: 31.8 mmHg — ABNORMAL LOW (ref 32–48)
pH, Arterial: 7.383 (ref 7.35–7.45)
pO2, Arterial: 94 mmHg (ref 83–108)

## 2024-06-06 LAB — PROTIME-INR
INR: 3.7 — ABNORMAL HIGH (ref 0.8–1.2)
INR: 2.3 — ABNORMAL HIGH (ref 0.8–1.2)
Prothrombin Time: 38 s — ABNORMAL HIGH (ref 11.4–15.2)
Prothrombin Time: 26.4 s — ABNORMAL HIGH (ref 11.4–15.2)

## 2024-06-06 LAB — GLUCOSE, CAPILLARY
Glucose-Capillary: 120 mg/dL — ABNORMAL HIGH (ref 70–99)
Glucose-Capillary: 129 mg/dL — ABNORMAL HIGH (ref 70–99)
Glucose-Capillary: 172 mg/dL — ABNORMAL HIGH (ref 70–99)
Glucose-Capillary: 185 mg/dL — ABNORMAL HIGH (ref 70–99)
Glucose-Capillary: 187 mg/dL — ABNORMAL HIGH (ref 70–99)
Glucose-Capillary: 81 mg/dL (ref 70–99)

## 2024-06-06 LAB — MAGNESIUM: Magnesium: 1.7 mg/dL (ref 1.7–2.4)

## 2024-06-06 LAB — DIC (DISSEMINATED INTRAVASCULAR COAGULATION)PANEL
D-Dimer, Quant: >20 ug{FEU}/mL — ABNORMAL HIGH (ref 0.00–0.50)
Fibrinogen: <60 mg/dL — CL (ref 210–475)
INR: 2.7 — ABNORMAL HIGH (ref 0.8–1.2)
Platelets: 66 K/uL — ABNORMAL LOW (ref 150–400)
Prothrombin Time: 29.5 s — ABNORMAL HIGH (ref 11.4–15.2)
Smear Review: NONE SEEN
aPTT: 61 s — ABNORMAL HIGH (ref 24–36)

## 2024-06-06 LAB — LACTIC ACID, PLASMA
Lactic Acid, Venous: 2.8 mmol/L (ref 0.5–1.9)
Lactic Acid, Venous: 2.6 mmol/L (ref 0.5–1.9)

## 2024-06-06 LAB — I-STAT CG4 LACTIC ACID, ED: Lactic Acid, Venous: 5 mmol/L (ref 0.5–1.9)

## 2024-06-06 LAB — HEMOGLOBIN A1C
Hgb A1c MFr Bld: 4.5 % — ABNORMAL LOW (ref 4.8–5.6)
Mean Plasma Glucose: 82.45 mg/dL

## 2024-06-06 LAB — CG4 I-STAT (LACTIC ACID): Lactic Acid, Venous: 2.9 mmol/L (ref 0.5–1.9)

## 2024-06-06 LAB — MRSA NEXT GEN BY PCR, NASAL: MRSA by PCR Next Gen: NOT DETECTED

## 2024-06-06 LAB — AMMONIA: Ammonia: 37 umol/L — ABNORMAL HIGH (ref 9–35)

## 2024-06-06 LAB — TSH: TSH: 0.717 u[IU]/mL (ref 0.350–4.500)

## 2024-06-06 LAB — CORTISOL: Cortisol, Plasma: 46.2 ug/dL

## 2024-06-06 MED ORDER — LINEZOLID 600 MG/300ML IV SOLN
600.0000 mg | Freq: Two times a day (BID) | INTRAVENOUS | Status: DC
  Administered 2024-06-06: 600 mg via INTRAVENOUS
  Filled 2024-06-06 (×2): qty 300

## 2024-06-06 MED ORDER — LACTATED RINGERS IV BOLUS (SEPSIS)
1000.0000 mL | Freq: Once | INTRAVENOUS | Status: AC
  Administered 2024-06-06: 1000 mL via INTRAVENOUS

## 2024-06-06 MED ORDER — LACTATED RINGERS IV SOLN: INTRAVENOUS | Status: AC

## 2024-06-06 MED ORDER — BUDESONIDE 0.5 MG/2ML IN SUSP
0.5000 mg | Freq: Two times a day (BID) | RESPIRATORY_TRACT | Status: DC
  Administered 2024-06-06 – 2024-06-19 (×25): 0.5 mg via RESPIRATORY_TRACT
  Filled 2024-06-06 (×27): qty 2

## 2024-06-06 MED ORDER — MAGNESIUM SULFATE 2 GM/50ML IV SOLN
2.0000 g | Freq: Once | INTRAVENOUS | Status: AC
  Administered 2024-06-06: 2 g via INTRAVENOUS
  Filled 2024-06-06: qty 50

## 2024-06-06 MED ORDER — OXYCODONE HCL 5 MG PO TABS
10.0000 mg | ORAL_TABLET | Freq: Four times a day (QID) | ORAL | Status: DC | PRN
  Administered 2024-06-07 – 2024-06-13 (×11): 10 mg via ORAL
  Filled 2024-06-06 (×12): qty 2

## 2024-06-06 MED ORDER — HYDROCORTISONE SOD SUC (PF) 100 MG IJ SOLR
100.0000 mg | Freq: Three times a day (TID) | INTRAMUSCULAR | Status: DC
  Administered 2024-06-06: 100 mg via INTRAVENOUS
  Filled 2024-06-06: qty 2

## 2024-06-06 MED ORDER — SODIUM CHLORIDE 0.9 % IV SOLN: 2.0000 g | Freq: Three times a day (TID) | INTRAVENOUS | Status: DC

## 2024-06-06 MED ORDER — DOCUSATE SODIUM 100 MG PO CAPS: 100.0000 mg | ORAL_CAPSULE | Freq: Two times a day (BID) | ORAL | Status: DC | PRN

## 2024-06-06 MED ORDER — OXYCODONE HCL 5 MG PO TABS
5.0000 mg | ORAL_TABLET | Freq: Four times a day (QID) | ORAL | Status: DC | PRN
  Administered 2024-06-06 – 2024-06-20 (×6): 5 mg via ORAL
  Filled 2024-06-06 (×6): qty 1

## 2024-06-06 MED ORDER — SODIUM CHLORIDE 0.9 % IR SOLN
3000.0000 mL | Status: DC
  Administered 2024-06-06: 3000 mL

## 2024-06-06 MED ORDER — DIPHENHYDRAMINE HCL 50 MG/ML IJ SOLN
25.0000 mg | Freq: Once | INTRAMUSCULAR | Status: AC
  Administered 2024-06-06: 25 mg via INTRAVENOUS
  Filled 2024-06-06: qty 1

## 2024-06-06 MED ORDER — ORAL CARE MOUTH RINSE: 15.0000 mL | OROMUCOSAL | Status: DC | PRN

## 2024-06-06 MED ORDER — VANCOMYCIN HCL 2000 MG/400ML IV SOLN
2000.0000 mg | Freq: Once | INTRAVENOUS | Status: DC
  Filled 2024-06-06: qty 400

## 2024-06-06 MED ORDER — POLYETHYLENE GLYCOL 3350 17 G PO PACK: 17.0000 g | PACK | Freq: Every day | ORAL | Status: DC | PRN

## 2024-06-06 MED ORDER — ARFORMOTEROL TARTRATE 15 MCG/2ML IN NEBU
15.0000 ug | INHALATION_SOLUTION | Freq: Two times a day (BID) | RESPIRATORY_TRACT | Status: DC
  Administered 2024-06-06 – 2024-06-19 (×24): 15 ug via RESPIRATORY_TRACT
  Filled 2024-06-06 (×26): qty 2

## 2024-06-06 MED ORDER — POTASSIUM CHLORIDE 10 MEQ/50ML IV SOLN
10.0000 meq | INTRAVENOUS | Status: AC
  Administered 2024-06-06 (×4): 10 meq via INTRAVENOUS
  Filled 2024-06-06 (×4): qty 50

## 2024-06-06 MED ORDER — INSULIN ASPART 100 UNIT/ML IJ SOLN
0.0000 [IU] | INTRAMUSCULAR | Status: DC
  Administered 2024-06-06 – 2024-06-08 (×11): 1 [IU] via SUBCUTANEOUS
  Filled 2024-06-06 (×6): qty 1
  Filled 2024-06-06: qty 3
  Filled 2024-06-06 (×3): qty 1
  Filled 2024-06-06: qty 2
  Filled 2024-06-06 (×2): qty 1

## 2024-06-06 MED ORDER — STERILE WATER FOR INJECTION IJ SOLN
INTRAMUSCULAR | Status: AC
  Filled 2024-06-06: qty 10

## 2024-06-06 MED ORDER — GERHARDT'S BUTT CREAM
TOPICAL_CREAM | Freq: Two times a day (BID) | CUTANEOUS | Status: DC
  Administered 2024-06-07 – 2024-06-13 (×6): 1 via TOPICAL
  Filled 2024-06-06 (×4): qty 60

## 2024-06-06 MED ORDER — HYDROCORTISONE SOD SUC (PF) 100 MG IJ SOLR
100.0000 mg | Freq: Two times a day (BID) | INTRAMUSCULAR | Status: DC
  Administered 2024-06-06 – 2024-06-08 (×4): 100 mg via INTRAVENOUS
  Filled 2024-06-06 (×4): qty 2

## 2024-06-06 MED ORDER — NOREPINEPHRINE 4 MG/250ML-% IV SOLN
0.0000 ug/min | INTRAVENOUS | Status: DC
  Administered 2024-06-06: 2 ug/min via INTRAVENOUS
  Filled 2024-06-06 (×2): qty 250

## 2024-06-06 MED ORDER — SODIUM CHLORIDE 0.9 % IV SOLN
2.0000 g | Freq: Once | INTRAVENOUS | Status: AC
  Administered 2024-06-06: 2 g via INTRAVENOUS
  Filled 2024-06-06: qty 2000

## 2024-06-06 MED ORDER — VANCOMYCIN HCL 250 MG PO CAPS
500.0000 mg | ORAL_CAPSULE | Freq: Four times a day (QID) | ORAL | Status: DC
  Administered 2024-06-06 (×2): 500 mg via ORAL
  Filled 2024-06-06 (×3): qty 2

## 2024-06-06 MED ORDER — CHLORHEXIDINE GLUCONATE CLOTH 2 % EX PADS
6.0000 | MEDICATED_PAD | Freq: Every day | CUTANEOUS | Status: DC
  Administered 2024-06-06 – 2024-06-24 (×18): 6 via TOPICAL

## 2024-06-06 MED ORDER — POTASSIUM CHLORIDE 10 MEQ/100ML IV SOLN: 10.0000 meq | INTRAVENOUS | Status: DC

## 2024-06-06 MED ORDER — ACETAMINOPHEN 325 MG PO TABS: 650.0000 mg | ORAL_TABLET | ORAL | Status: DC | PRN

## 2024-06-06 MED ORDER — VANCOMYCIN HCL 125 MG PO CAPS
125.0000 mg | ORAL_CAPSULE | Freq: Every day | ORAL | Status: DC
  Administered 2024-06-07 – 2024-06-09 (×3): 125 mg via ORAL
  Filled 2024-06-06 (×3): qty 1

## 2024-06-06 MED ORDER — OXIDIZED CELLULOSE EX PADS
1.0000 | MEDICATED_PAD | Freq: Once | CUTANEOUS | Status: AC
  Administered 2024-06-06: 1 via TOPICAL
  Filled 2024-06-06: qty 1

## 2024-06-06 MED ORDER — METRONIDAZOLE 500 MG/100ML IV SOLN
500.0000 mg | Freq: Three times a day (TID) | INTRAVENOUS | Status: DC
  Administered 2024-06-06: 500 mg via INTRAVENOUS
  Filled 2024-06-06: qty 100

## 2024-06-06 MED ORDER — SODIUM CHLORIDE 0.9 % IR SOLN: 3000.0000 mL | Status: DC

## 2024-06-06 MED ORDER — SODIUM CHLORIDE 0.9 % IV SOLN
1.0000 g | Freq: Three times a day (TID) | INTRAVENOUS | Status: DC
  Administered 2024-06-06 – 2024-06-09 (×10): 1 g via INTRAVENOUS
  Filled 2024-06-06 (×10): qty 20

## 2024-06-06 MED ORDER — LACTATED RINGERS IV BOLUS (SEPSIS)
500.0000 mL | Freq: Once | INTRAVENOUS | Status: AC
  Administered 2024-06-06: 500 mL via INTRAVENOUS

## 2024-06-06 MED ORDER — NOREPINEPHRINE 16 MG/250ML-% IV SOLN
0.0000 ug/min | INTRAVENOUS | Status: DC
  Administered 2024-06-06: 25 ug/min via INTRAVENOUS
  Administered 2024-06-06: 21 ug/min via INTRAVENOUS
  Administered 2024-06-07: 14 ug/min via INTRAVENOUS
  Administered 2024-06-08: 13 ug/min via INTRAVENOUS
  Administered 2024-06-09: 9 ug/min via INTRAVENOUS
  Filled 2024-06-06 (×6): qty 250

## 2024-06-06 MED ORDER — REVEFENACIN 175 MCG/3ML IN SOLN
175.0000 ug | Freq: Every day | RESPIRATORY_TRACT | Status: AC
  Administered 2024-06-06 – 2024-06-19 (×12): 175 ug via RESPIRATORY_TRACT
  Filled 2024-06-06 (×15): qty 3

## 2024-06-06 MED ORDER — IPRATROPIUM-ALBUTEROL 0.5-2.5 (3) MG/3ML IN SOLN: 3.0000 mL | RESPIRATORY_TRACT | Status: DC | PRN

## 2024-06-06 MED ORDER — MAGNESIUM SULFATE 4 GM/100ML IV SOLN
4.0000 g | Freq: Once | INTRAVENOUS | Status: AC
  Administered 2024-06-06: 4 g via INTRAVENOUS
  Filled 2024-06-06: qty 100

## 2024-06-06 MED ORDER — VASOPRESSIN 20 UNITS/100 ML INFUSION FOR SHOCK
0.0000 [IU]/min | INTRAVENOUS | Status: DC
  Administered 2024-06-06 – 2024-06-08 (×6): 0.03 [IU]/min via INTRAVENOUS
  Filled 2024-06-06 (×6): qty 100

## 2024-06-06 MED ORDER — PANTOPRAZOLE SODIUM 40 MG IV SOLR
40.0000 mg | Freq: Two times a day (BID) | INTRAVENOUS | Status: DC
  Administered 2024-06-06 – 2024-06-10 (×9): 40 mg via INTRAVENOUS
  Filled 2024-06-06 (×10): qty 10

## 2024-06-06 NOTE — Progress Notes (Signed)
 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 DNR/DNI Vitals, labs, and images reviewed.  Suspicious for urinary source  eICU Interventions  Broad-spectrum antibiotics in place, fluid resuscitated, maintain norepinephrine  as needed for MAP greater than 65 [name] testing, empiric treatment with Flagyl  DVT prophylaxis with SCDs in the setting of hematuria and CBI Urology Consult GI prophylaxis with therapeutic PPI     Intervention Category Evaluation Type: New Patient Evaluation  Thedore Pickel 06/06/2024, 4:16 AM

## 2024-06-06 NOTE — Progress Notes (Signed)
 CCM at bedside, just placed CVC and a-line.

## 2024-06-06 NOTE — Progress Notes (Signed)
 PHARMACY - PHYSICIAN COMMUNICATION CRITICAL VALUE ALERT - BLOOD CULTURE IDENTIFICATION (BCID)  Tim [name] is an 83 y.o. male who presented to [facility] on 06/05/2024 with a chief complaint of sepsis.  Assessment:  BCID with E coli and Klebsiella pneumoniae with resistance detected (in both bottles of the single culture collected 12/5 00:02)  Name of physician (or Provider) Contacted: Chand  Current antibiotics: Linezolid , Merrem , PO vancomycin  QID  Changes to prescribed antibiotics recommended:  STOP linezolid , continue merrem , change PO vancomycin  to daily as ppx  Results for orders placed or performed during the hospital encounter of 06/05/24  Blood Culture ID Panel (Reflexed) (Collected: 06/06/2024 12:02 AM)  Result Value Ref Range   Enterococcus faecalis NOT DETECTED NOT DETECTED   Enterococcus Faecium NOT DETECTED NOT DETECTED   Listeria monocytogenes NOT DETECTED NOT DETECTED   Staphylococcus species NOT DETECTED NOT DETECTED   Staphylococcus aureus (BCID) NOT DETECTED NOT DETECTED   Staphylococcus epidermidis NOT DETECTED NOT DETECTED   Staphylococcus lugdunensis NOT DETECTED NOT DETECTED   Streptococcus species NOT DETECTED NOT DETECTED   Streptococcus agalactiae NOT DETECTED NOT DETECTED   Streptococcus pneumoniae NOT DETECTED NOT DETECTED   Streptococcus pyogenes NOT DETECTED NOT DETECTED   A.calcoaceticus-baumannii NOT DETECTED NOT DETECTED   Bacteroides fragilis NOT DETECTED NOT DETECTED   Enterobacterales DETECTED (A) NOT DETECTED   Enterobacter cloacae complex NOT DETECTED NOT DETECTED   Escherichia coli DETECTED (A) NOT DETECTED   Klebsiella aerogenes NOT DETECTED NOT DETECTED   Klebsiella oxytoca NOT DETECTED NOT DETECTED   Klebsiella pneumoniae DETECTED (A) NOT DETECTED   Proteus species NOT DETECTED NOT DETECTED   Salmonella species NOT DETECTED NOT DETECTED   Serratia marcescens NOT DETECTED NOT DETECTED   Haemophilus influenzae NOT DETECTED NOT DETECTED    Neisseria meningitidis NOT DETECTED NOT DETECTED   Pseudomonas aeruginosa NOT DETECTED NOT DETECTED   Stenotrophomonas maltophilia NOT DETECTED NOT DETECTED   Candida albicans NOT DETECTED NOT DETECTED   Candida auris NOT DETECTED NOT DETECTED   Candida glabrata NOT DETECTED NOT DETECTED   Candida krusei NOT DETECTED NOT DETECTED   Candida parapsilosis NOT DETECTED NOT DETECTED   Candida tropicalis NOT DETECTED NOT DETECTED   Cryptococcus neoformans/gattii NOT DETECTED NOT DETECTED   CTX-M ESBL DETECTED (A) NOT DETECTED   Carbapenem resistance IMP NOT DETECTED NOT DETECTED   Carbapenem resistance KPC NOT DETECTED NOT DETECTED   Carbapenem resistance NDM NOT DETECTED NOT DETECTED   Carbapenem resist OXA 48 LIKE NOT DETECTED NOT DETECTED   Carbapenem resistance VIM NOT DETECTED NOT DETECTED    Maurilio Fila, PharmD Clinical Pharmacist 06/06/2024  2:38 PM

## 2024-06-06 NOTE — Progress Notes (Signed)
 Patient blood culture is growing E. coli and Klebsiella, one of them is multidrug-resistant organism.  Probably urinary source He did not have bowel movement since admission to ICU  Will discontinue enteric precautions Continue meropenem  Stop Zyvox   Valinda Novas, MD

## 2024-06-06 NOTE — Progress Notes (Signed)
 ED Pharmacy Antibiotic Sign Off An antibiotic consult was received from an ED provider for vancomycin  per pharmacy dosing for r/o meningitis. A chart review was completed to assess appropriateness. Vanc allergy (rash) noted; this is typically an infusion reaction; pt did receive a course of oral vanc last month with no signs of reaction though this does not exclude a true allergy given oral vanc is typically not systemically absorbed; d/w ED PA Leita who agrees a trial of vanc run at a slow infusion rate and premedication with diphenhydramine .  The following one time order(s) were placed:  Vancomycin  2000mg  IV x1  Further antibiotic and/or antibiotic pharmacy consults should be ordered by the admitting provider if indicated.   Thank you for allowing pharmacy to be a part of this patient's care.   Marvetta Dauphin, PharmD, BCPS   Clinical Pharmacist 06/06/24 1:54 AM

## 2024-06-06 NOTE — H&P (Signed)
 NAME:  Tim [name], MRN:  [id number], DOB:  1941-04-21, LOS: 0 ADMISSION DATE:  06/05/2024, CONSULTATION DATE:  12/5 REFERRING MD:  Dr. 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  Patient has history of enterococcal infection of the AICD lead that is being treated with chronic amoxicillin .  Patient recently admitted to [facility] on 10/9-10/17 with mixed hypovolemic/septic shock.  Patient been admitted to [facility] on 10/29-11/1 with [name] treated w/ vanc. Wife states he has not had diarrhea symptoms and off po vanc. Did have diarrhea on 12/3 and so did wife. Wife also states he has poor po intake. On 12/4 overnight patient had high fever. EMS called found temp of 103.7. States currently on po vanc for c diff. On arrival to [name] ED temp 99.3 F and wbc 6.7. LA 5. Chronic foley in place w/ hematuria which was replaced. UA inconclusive. INR 3.7. CXR unremarkable. Cultures obtained and started on broad spectrum abx. Despite fluids remains hypotensive started on levo. PCCM consulted.   Pertinent  Medical History   Past Medical History:  Diagnosis Date   AICD (automatic cardioverter/defibrillator) present    Balanitis    +severe phimosis+ buried penis->circ not possible but dorsal slit done 10/2019   BPH (benign prostatic hypertrophy)    with urinary retention.  Renal u/s 12/04/19 NORMAL KIDNEYS, NORMAL BLADDER, NO HYDRONEPHROSIS   CAD (coronary artery disease)    Nonobstructive by 12/2012 cath; then 03/2016 he required BMS to RCA ([facility]).  In-stent restenosis on cath 11/01/16, baloon angioplasty successful.   CATARACT, HX OF    Chronic combined systolic and diastolic CHF (congestive heart failure)  (HCC) 05/2016   Ischemic CM.  04/2018 EF 40-45%, grd I DD.  07/2021 EF 55-60%, AV well seated   Chronic pain syndrome    Lumbar DDD; chronic neuropathic pain (DM); has spinal stimulator and sees pain mgmt MD   Complicated UTI (urinary tract infection) 09/2019   Phimoses, acute urinary retention, entoerococcus UTI, enterococc bacteremia.  F/u blood clx's neg x 5d.     COPD    Debilitated patient    Diabetes mellitus type 2 with complications (HCC)    HbA1c jumped from 5.7% to 6.1% 03/2015---started metformin  at that time.  DM 2 dx by fasting gluc criteria 2018.  Has chronic neuropath pain   Enterococcal bacteremia 09/2019   Phimoses, acute urinary retention, entoerococcus UTI, enterococc bacteremia.  F/u blood clx's neg x 5d.     Essential hypertension, benign    Fatty liver 2007   2007 u/s showed fatty liver with hepatosplenomegaly.  2019 repeat u/s->fatty liver but no cirrhosis or hepatosplenomegaly.   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 Fleeta Rothman)   Iron deficiency anemia    09/2022.  Hemoccults x 3 neg Mar 2024.  Improving with oral iron. GI following, no endoscopies  Lumbosacral neuritis    Lumbosacral spondylosis    Lumbar spinal stenosis with neurogenic claudication--contributes to his chronic pain syndrome   Morbid obesity (HCC)    Normal memory function 08/2014   Neuropsychological testing ([facility]): no cognitive impairment or sign of neurodegenerative disorder.  Likely has adjustment d/o with mixed anxiety/depressed features and may benefit from low dose SNRI.     Normocytic anemia 03/2016   Mild-pt needs  ferritin and vit B12 level checked (as of 03/22/16). Hb stable 09/2019.  Hemoccult x 3 NEG 03/2022.  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 chronic?) Off anticoag for a while due to falls.  Then apixaban  started 12/2014.   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 Clorinda): neuropathic pain->diff to treat, failed nucynta , failed spinal stimul trial, oxycontin  hs + tramadol  + gabap as of 12/2017 f/u Dr. Carilyn.   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   Sick sinus syndrome (HCC)    PPM placed   Thrombocytopenia 2018   HSM on 2007 abd u/s---suspect some mild splenic sequestration chronically.   Unspecified glaucoma(365.9)    Unspecified hereditary and idiopathic peripheral neuropathy approx age 84   bilat LE's, ? left arm, too.  Feet became progressively numb + left foot pain intermittently.  Pt may be trying a spinal stimulator (as of 05/2015)   Vaccine counseling 11/16/2021   VENOUS INSUFFICIENCY    Being  followed by Dr. Harden as of 10/2016 for two R LL venous stasis ulcers/skin tears.  Healed as of 10/30/16 f/u with Dr. Harden.   Venous stasis dermatitis 12/27/2021   VENTRAL HERNIA      Significant Hospital Events: Including procedures, antibiotic start and stop dates in addition to other pertinent events   12/5 admit to [facility]  Interim History / Subjective:  See above  Objective    Blood pressure (!) 64/46, pulse (!) 120, temperature 99.3 F (37.4 C), temperature source Temporal, resp. rate 13, height 6' 4 (1.93 m), weight 115 kg, SpO2 100%.       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 on chronic amoxicillin  LA Plan: -levo for map goal >65 -getting fluids; trend LA -po vanc and flagyl ; add cefepime ; ID consult -cultures, cdif, gi panel pending -DNR limited; given frequent admissions will order palliative care -consider echo -CT scan CAP  Hematuria: has had issue in past and not on eliquis  BPH w/ chronic foley in place Plan: -foley exchanged in ED -given significant hematuria with some clotting, CBI started in ED -consider urology consult  COPD Plan: -triple therapy nebs  Acute encephalopathy: mildly confused; alert to self and year; likely sepsis related; also on  Plan: -CT head -ammonia and tsh -limit sedating meds -treat for sepsis as above  AKI Plan: -cont foley -iv fluids -Trend BMP / urinary  output -Replace electrolytes as indicated -Avoid nephrotoxic agents, ensure adequate renal perfusion  Elevated LFTs Plan: -ct abd/pelvis -trend cmp  AS s/p TAVR with 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 on chronic amoxicillin  CAD s/p PCI Combined systolic/diastolic CHF PAF not on AC due to hematuria Plan: -hold anti-htn meds with hypotension -daily weights; strict I/o's -consider echo -hold statin w/ elevated lfts -hold asa w/ hematuria  Trace B/l swelling Plan: -this is likely chronic but since patient has not been on [facility] will check dvt us   chronic pain syndrome Plan: -with ams hold pain medications  DMT2 Plan: -ssi and cbg monitoring  -a1c  Elevated INR: per wife not on AC; possibly sepsis related Anemia Thrombocytopenia Plan: -trend coags and cbc -scds for now for dvt ppx   Wife at bedside updated. States he is DNR Limited.  Labs   CBC: Recent Labs  Lab 06/06/24 0049 06/06/24 0111  WBC 6.7  --   NEUTROABS PENDING  --   HGB 12.9* 13.6  HCT 39.1 40.0  MCV 98.5  --   PLT 61*  --     Basic Metabolic Panel: Recent Labs  Lab 06/06/24 0111  NA 137  K 4.0  CL 100  GLUCOSE 104*  BUN 18  CREATININE 1.20   GFR: Estimated Creatinine Clearance: 63.6 mL/min (by C-G formula based on SCr of 1.2 mg/dL). Recent Labs  Lab 06/06/24 0049 06/06/24 0111  WBC 6.7  --   LATICACIDVEN  --  5.0*    Liver Function Tests: No results for input(s): AST, ALT, ALKPHOS, BILITOT, PROT, ALBUMIN in the last 168 hours. No results for input(s): LIPASE, AMYLASE in the last 168 hours. No results for input(s): AMMONIA in the last 168 hours.  ABG    Component Value Date/Time   PHART 7.397 12/24/2012 1719   PCO2ART 40.9 12/24/2012 1719   PO2ART 87.0 12/24/2012 1719   HCO3 26.7 (H) 12/24/2012 1726   TCO2 23 06/06/2024 0111   O2SAT 69.0 12/24/2012 1726     Coagulation Profile: Recent Labs  Lab 06/06/24 0049   INR 3.7*    Cardiac Enzymes: No results for input(s): CKTOTAL, CKMB, CKMBINDEX, TROPONINI in the last 168 hours.  HbA1C: Hemoglobin A1C  Date/Time Value Ref Range Status  03/06/2024 02:50 PM 5.8 (A) 4.0 - 5.6 % Final  06/08/2023 03:20 PM 5.5 4.0 - 5.6 % Final   HbA1c, POC (prediabetic range)  Date/Time Value Ref Range Status  03/06/2024 02:50 PM 5.8 5.7 - 6.4 % Final  06/08/2023 03:20 PM 5.5 (  A) 5.7 - 6.4 % Final   HbA1c, POC (controlled diabetic range)  Date/Time Value Ref Range Status  03/06/2024 02:50 PM 5.8 0.0 - 7.0 % Final  06/08/2023 03:20 PM 5.5 0.0 - 7.0 % Final   HbA1c POC (<> result, manual entry)  Date/Time Value Ref Range Status  03/06/2024 02:50 PM 5.8 4.0 - 5.6 % Final  06/08/2023 03:20 PM 5.5 4.0 - 5.6 % Final    CBG: No results for input(s): GLUCAP in the last 168 hours.  Review of Systems:   Patient is encephalopathic; therefore, history has been obtained from chart review.    Past Medical History:  He,  has a past medical history of AICD (automatic cardioverter/defibrillator) present, Balanitis, BPH (benign prostatic hypertrophy), CAD (coronary artery disease), CATARACT, HX OF, Chronic combined systolic and diastolic CHF (congestive heart failure) (HCC) (05/2016), Chronic pain syndrome, Complicated UTI (urinary tract infection) (09/2019), COPD, Debilitated patient, Diabetes mellitus type 2 with complications (HCC), Enterococcal bacteremia (09/2019), Essential hypertension, benign, Fatty liver (2007), Generalized weakness, GERD (gastroesophageal reflux disease), Glaucoma, GOUT, HH (hiatus hernia), HYPERCHOLESTEROLEMIA-PURE, Hypogonadism male, ICD (implantable cardioverter-defibrillator) infection (12/22/2019), 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), Iron deficiency anemia, Lumbosacral neuritis, Lumbosacral spondylosis, Morbid obesity (HCC), Normal memory function (08/2014), Normocytic anemia (03/2016), NSTEMI (non-ST elevated myocardial  infarction) (HCC) (03/20/2016), Obesity hypoventilation syndrome (HCC), Orthostatic hypotension, OSA on CPAP, OSTEOARTHRITIS, 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), Paroxysmal atrial fibrillation (HCC) (2003), Peripheral neuropathy, Personal history of colonic adenoma (10/30/2012), PFO (patent foramen ovale) (09/2019), Physical deconditioning (02/23/2021), Presence of cardiac defibrillator (11/07/2017), Primary osteoarthritis of both knees, Prosthetic valve endocarditis (12/22/2019), PUD (peptic ulcer disease), PULMONARY HYPERTENSION, HX OF, Secondary male hypogonadism (2017), Sepsis (HCC) (04/29/2020), Severe aortic stenosis, Shortness of breath, Sick sinus syndrome (HCC), Thrombocytopenia (2018), Unspecified glaucoma(365.9), Unspecified hereditary and idiopathic peripheral neuropathy (approx age 73), Vaccine counseling (11/16/2021), VENOUS INSUFFICIENCY, Venous stasis dermatitis (12/27/2021), and VENTRAL HERNIA.   Surgical History:   Past Surgical History:  Procedure Laterality Date   AMPUTATION Left 04/11/2013   Procedure: AMPUTATION DIGIT Left 3rd toe;  Surgeon: Jerona LULLA Sage, MD;  Location: [facility] OR;  Service: Orthopedics;  Laterality: Left;  Left 3rd toe amputation at [facility]   AMPUTATION Left 08/29/2019   Procedure: LEFT TRANSMETATARSAL AMPUTATION;  Surgeon: Sage Jerona LULLA, MD;  Location: [facility] OR;  Service: Orthopedics;  Laterality: Left;   BIOPSY  05/04/2020   Procedure: BIOPSY;  Surgeon: Charlanne Groom, MD;  Location: [facility] ENDOSCOPY;  Service: Endoscopy;;   CARDIAC CATHETERIZATION  1997; 03/10/16   1997 Non-obstructive disease.  03/2016 BMS to RCA, with 25% pDiag dz, o/w normal cors per cath 03/07/16.  Cath 11/01/16: in stent restenosis, successful baloon angioplasty. 08/2020 branch vessel dz, cont medical therapy rec'd.   CARDIAC CATHETERIZATION  12/24/2012   mild < 20% LCx, prox 30% RCA; LVEF 55-65% , moderate pulmonary HTN, moderate AS   CARDIAC DEFIBRILLATOR PLACEMENT  11/07/2017   Claria MRI Quad  CRT defibrillator   CARDIOVASCULAR STRESS TEST  05/11/16 ([facility])   2017 Myocardial perfusion imaging:  No ischemia; scar in apex, global hypokinesis, EF 36%.  06/15/20->mod primarily fixed inferolat wall defect mildly worse with stress c/w infarct/scar with mild peri-infarct ischemia, normal EF-->for cath per cards.   Carotid dopplers  03/09/2016   [facility]: no hemodynamically significant stenosis on either side.   CHOLECYSTECTOMY     COLONOSCOPY [name] 10/30/2012   Procedure: COLONOSCOPY;  Surgeon: Lupita FORBES Commander, MD;  Location: [facility] ENDOSCOPY;  Service: Endoscopy;  Laterality: [name];   CORONARY ANGIOPLASTY  WITH STENT PLACEMENT  03/2016; 04/2017   2017-[facility]: BMS to RCA-pt was placed on Brilinta.  04/2017: DES to RCA.   DORSAL SLIT [name] 10/29/2019   for severe phimosis. Procedure: DORSAL SLIT;  Surgeon: Alvaro Hummer, MD;  Location: [facility] ORS;  Service: Urology;  Laterality: [name];  45 MINS   ESOPHAGEAL DILATION  05/04/2020   Procedure: ESOPHAGEAL DILATION;  Surgeon: Charlanne Groom, MD;  Location: [name] ENDOSCOPY;  Service: Endoscopy;;   ESOPHAGOGASTRODUODENOSCOPY (EGD) WITH PROPOFOL  [name] 05/04/2020   Procedure: ESOPHAGOGASTRODUODENOSCOPY (EGD) WITH PROPOFOL ;  Surgeon: Charlanne Groom, MD;  Location: [facility] ENDOSCOPY;  Service: Endoscopy;  Laterality: [name];   EYE SURGERY Bilateral cataract   HEMORRHOID SURGERY     INTRAOCULAR LENS INSERTION Bilateral    KNEE SURGERY Right    LEFT AND RIGHT HEART CATHETERIZATION WITH CORONARY ANGIOGRAM [name] 12/24/2012   Procedure: LEFT AND RIGHT HEART CATHETERIZATION WITH CORONARY ANGIOGRAM;  Surgeon: Peter M Jordan, MD;  Location: [facility] CATH LAB;  Service: Cardiovascular;  Laterality: [name];   LEG SURGERY Bilateral    lenghtening    PACEMAKER PLACEMENT  04/13/2016   2nd deg HB after TAVR, pt had DC MDT PPM placed.   SHOULDER ARTHROSCOPY  08/30/2011   Procedure: ARTHROSCOPY SHOULDER;  Surgeon: Jerona LULLA Sage, MD;  Location: [facility] OR;  Service: Orthopedics;  Laterality: Right;  Right  Shoulder Arthroscopy, Debridement, and Decompression   SPINAL CORD STIMULATOR INSERTION [name] 09/10/2015   Procedure: LUMBAR SPINAL CORD STIMULATOR INSERTION;  Surgeon: Deward Fabian, MD;  Location: [facility] NEURO ORS;  Service: Neurosurgery;  Laterality: [name];   TEE WITHOUT CARDIOVERSION [name] 09/16/2019   Procedure: TRANSESOPHAGEAL ECHOCARDIOGRAM (TEE);  Surgeon: Raford Riggs, MD;  Location: [facility] ENDOSCOPY;  Service: Cardiovascular;  Laterality: [name];   TEE WITHOUT CARDIOVERSION [name] 12/02/2019   +vegetation on AVR and pacer lead in RV.  EF 55-60%, normal wall motion.  Valves function normal.  Procedure: TRANSESOPHAGEAL ECHOCARDIOGRAM (TEE);  Surgeon: Barbaraann Darryle Ned, MD;  Location: [facility] ENDOSCOPY;  Service: Cardiovascular;  Laterality: [name];   TOE AMPUTATION Left    due to osteomyelitis.  R big toe surg due to osteoarth   TONSILLECTOMY     traeculectomy Left    eye   TRANSCATHETER AORTIC VALVE REPLACEMENT, TRANSFEMORAL  04/11/2016   TRANSESOPHAGEAL ECHOCARDIOGRAM  03/09/2016; 09/2019   [facility]: EF 55-60%, PFO seen with bi-directional shunting, no thrombus in appendage.  09/2019 ->no valvular vegetations. Small patent foramen ovale with predominantly left to right shunting across the interatrial septum.   TRANSESOPHAGEAL ECHOCARDIOGRAM (CATH LAB) [name] 04/15/2024   Procedure: TRANSESOPHAGEAL ECHOCARDIOGRAM;  Surgeon: Mona Vinie BROCKS, MD;  Location: [facility] INVASIVE CV LAB;  Service: Cardiovascular;  Laterality: [name];   TRANSTHORACIC ECHOCARDIOGRAM  01/2015; 01/2016; 05/18/16; 09/18/16, 05/2017, 08/2017   01/2015 No signif change in aortic stenosis (moderate).  01/2016 Severe LVH w/small LV cavity, EF 60-65%, grade I diast dysfxn.  05/2016 (s/p TAVR): EF 50-55%, grd I DD, biopros AV good.  08/2016--EF 50-55%, LV septal motion c/w conduction abnl, grd I DD,mild MS,bioprosth aortic valve well seated, w/trace AR. 05/2017 TTE EF 35%. 08/2017-EF 35%, mod diff hypokin LV, grd I DD, biopros AV good.    TRANSTHORACIC  ECHOCARDIOGRAM  04/2018; 09/2019   04/2018: EF 40-45%, mod diffuse LV hypokin, grd I DD, bioprosth AV well seated, no AS or AR. 09/2019 EF 60-65%, grd I DD, valves fine, including bioprosth AV. 04/29/20 (tech diff) EF 55-60%, grd I DD, vegetation on MV.  07/2021 EF 55-60% (s/p upgrade to BiV PPM), AV well seated.  VITRECTOMY       Social History:   reports that he has never smoked. He has never been exposed to tobacco smoke. He has never used smokeless tobacco. He reports that he does not drink alcohol  and does not use drugs.   Family History:  His family history includes Coronary artery disease in his mother; Heart attack in his mother; Hypertension in his mother; Neuropathy in his mother; Pulmonary fibrosis in his father.   Allergies Allergies  Allergen Reactions   Brimonidine Tartrate Shortness Of Breath    Alphagan-Shortness of breath   Brinzolamide Shortness Of Breath    AZOPT- Shortness of breath   Latanoprost Shortness Of Breath    XALATAN- Shortness of breath   Nucynta  [Tapentadol ] Shortness Of Breath   Sulfa Antibiotics Palpitations   Timolol Maleate Shortness Of Breath and Other (See Comments)    TIMOPTIC- Aggravated asthma   Diltiazem Swelling     leg swelling   Rofecoxib Swelling     VIOXX- leg swelling   Vancomycin  Rash and Other (See Comments)    Blisters   Codeine Other (See Comments)    Childhood reaction   Tamsulosin Other (See Comments)    Dizziness    Celecoxib Other (See Comments)    CELLBREX-confusion   Colchicine Diarrhea    diarrhea   Tape Rash     Home Medications  Prior to Admission medications   Medication Sig Start Date End Date Taking? Authorizing Provider  albuterol  (VENTOLIN  HFA) 108 (90 Base) MCG/ACT inhaler TAKE 2 PUFFS BY MOUTH EVERY 6 HOURS AS NEEDED FOR WHEEZE OR SHORTNESS OF BREATH 03/04/24   McGowen, Aleene DEL, MD  allopurinol  (ZYLOPRIM ) 300 MG tablet Take 1 tablet (300 mg total) by mouth every evening. 05/19/24   McGowen, Aleene DEL, MD   amLODipine (NORVASC) 5 MG tablet Take 5 mg by mouth in the morning and at bedtime. 03/28/24 06/26/24  [provider]  amoxicillin  (AMOXIL ) 500 MG capsule Take 2 capsules (1,000 mg total) by mouth 2 (two) times daily. 03/17/24   Fleeta Kathie Jomarie LOISE, MD  apixaban  (ELIQUIS ) 5 MG TABS tablet Take 5 mg by mouth 2 (two) times daily.    [provider]  ASPIRIN  LOW DOSE 81 MG EC tablet Take 81 mg by mouth daily. 02/03/19   [provider]  B Complex-C (B-COMPLEX WITH VITAMIN C ) tablet Take 1 tablet by mouth daily. Take 1 tablet daily, 250mg     [provider]  carvedilol (COREG) 3.125 MG tablet Take 3.125 mg by mouth 2 (two) times daily with a meal. 03/28/24 06/26/24  [provider]  clotrimazole -betamethasone  (LOTRISONE ) cream Apply 1 Application topically daily. 03/28/24 03/28/25  [provider]  docusate sodium  (COLACE) 100 MG capsule Take 1 capsule (100 mg total) by mouth 2 (two) times daily as needed for mild constipation. 04/18/24   Fausto Burnard LABOR, DO  ezetimibe  (ZETIA ) 10 MG tablet Take 1 tablet (10 mg total) by mouth daily. 12/16/19   McGowen, Aleene DEL, MD  ferrous gluconate  (FERGON) 324 MG tablet Take 1 tablet (324 mg total) by mouth 2 (two) times daily. 02/19/24   McGowen, Aleene DEL, MD  FIBER PO Take 5 capsules by mouth in the morning and at bedtime.    [provider]  finasteride  (PROSCAR ) 5 MG tablet Take 1 tablet (5 mg total) by mouth daily. 02/19/24   McGowen, Aleene DEL, MD  fluticasone  (FLONASE ) 50 MCG/ACT nasal spray Place 2 sprays into both nostrils daily. Patient taking differently:  Place 2 sprays into both nostrils daily as needed for allergies. 03/06/24   McGowen, Aleene DEL, MD  fluticasone -salmeterol (WIXELA INHUB) 250-50 MCG/ACT AEPB Inhale 1 puff into the lungs 2 (two) times daily. in the morning and at bedtime. 12/12/23   McGowen, Aleene DEL, MD  gabapentin  (NEURONTIN ) 800 MG tablet Take 1 tablet (800 mg total) by mouth 2 (two)  times daily. 04/18/24   Fausto Burnard LABOR, DO  loratadine (CLARITIN) 10 MG tablet Take 10 mg by mouth daily.    [provider]  losartan  (COZAAR ) 25 MG tablet Take 25 mg by mouth daily. 03/29/24 06/27/24  [provider]  mometasone -formoterol  (DULERA ) 100-5 MCG/ACT AERO Inhale 2 puffs into the lungs as needed for wheezing or shortness of breath.    [provider]  Multiple Vitamin (MULTIVITAMIN ADULT PO) Take 2 tablets by mouth daily.    [provider]  naloxone  (NARCAN ) nasal spray 4 mg/0.1 mL One spray in nostril as needed for opoid overdose.  May repeat dose in 5 min if needed. Patient taking differently: Place 0.4 mg into the nose as needed (OD). 06/08/23   McGowen, Aleene DEL, MD  niacin  (NIASPAN ) 1000 MG CR tablet TAKE 1 TABLET BY MOUTH TWICE A DAY 03/17/24   McGowen, Aleene DEL, MD  nitroGLYCERIN  (NITROSTAT ) 0.4 MG SL tablet Place 0.4 mg under the tongue every 5 (five) minutes as needed for chest pain. 09/04/17   [provider]  nystatin  (MYCOSTATIN /NYSTOP ) powder Apply topically 2 (two) times daily. 04/18/24   Fausto Burnard A, DO  Oxycodone  HCl 20 MG TABS 1 tab po bid prn severe pain Patient taking differently: Take 20 mg by mouth daily. 03/21/24   McGowen, Aleene DEL, MD  pantoprazole  (PROTONIX ) 40 MG tablet Take 1 tablet (40 mg total) by mouth daily. Patient taking differently: Take 40 mg by mouth 2 (two) times daily. 09/01/22   McGowen, Aleene DEL, MD  potassium chloride  SA (KLOR-CON  M) 20 MEQ tablet Take 0.5 tablets (10 mEq total) by mouth 2 (two) times daily. 05/19/24   McGowen, Aleene DEL, MD  ranolazine (RANEXA) 500 MG 12 hr tablet Take 500 mg by mouth 2 (two) times daily. 03/28/24 06/26/24  [provider]  rosuvastatin  (CRESTOR ) 20 MG tablet Take 20 mg by mouth daily. 10/09/22   [provider]  torsemide  (DEMADEX ) 20 MG tablet Take 1 tablet (20 mg total) by mouth daily as needed (weight gain or swelling). 04/18/24   Fausto Burnard  A, DO  VITAMIN D, CHOLECALCIFEROL, PO Take 1 tablet by mouth daily.     [provider]     Critical care time: 45 minutes     JD Emilio DEVONNA [facility] 06/06/2024, 1:38 AM  Please see [email or web address] for pager details.  From 7A-7P if no response, please call [phone]. After hours, please call ELink [phone].

## 2024-06-06 NOTE — Consult Note (Signed)
 Urology Consult Note   Requesting Attending Physician:  Tim Scholz, MD Service Providing Consult: Urology  Consulting Attending: Dr. Nieves   Reason for Consult: Gross hematuria  HPI: Tim [name] is seen in consultation for reasons noted above at the request of Tim Scholz, MD. Patient is a 83 y.o. male presenting to [facility] emergency department via EMS from home.  On their arrival patient was found to be febrile with altered mental status-Tmax 103.7.  Chronic indwelling Foley catheter was noted to have gross hematuria, last changed 1 week prior with bleeding since.  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  I have met Tim [name] a couple of months ago for hematuria.  Long urologic history and has been seen by [facility] and [facility] u[facility].  He was arousable but resting with eyes closed on arrival.  Warren blood coming from displaced Foley catheter.  His wife accompanied him at bedside.  -----------------  Assessment:   83 y.o. male with gross hematuria in the context of sepsis and suspected DIC   Recommendations:  # Gross hematuria # DIC # Traumatic Foley placement  54F three-way hematuria coud placed with CBI on low gtt.  His not immediately clear whether he has DIC, regardless his urethral bleeding was primarily result of traumatic placement with balloon inflated in the prostate or urethra.  No clot material was appreciated during hand irrigation.  This should be self-limiting once his coagulation issues are corrected.  Continue CBI, titrating to roughly pink lemonade colored irrigant.  Hand irrigate as necessary  Urology will follow  Case and plan discussed with Dr. Nieves  Past Medical History: Past  Medical History:  Diagnosis Date   AICD (automatic cardioverter/defibrillator) present    Balanitis    +severe phimosis+ buried penis->circ not possible but dorsal slit done 10/2019   BPH (benign prostatic hypertrophy)    with urinary retention.  Renal u/s 12/04/19 NORMAL KIDNEYS, NORMAL BLADDER, NO HYDRONEPHROSIS   CAD (coronary artery disease)    Nonobstructive by 12/2012 cath; then 03/2016 he required BMS to RCA ([facility]).  In-stent restenosis on cath 11/01/16, baloon angioplasty successful.   CATARACT, HX OF    Chronic combined systolic and diastolic CHF (congestive heart failure) (HCC) 05/2016   Ischemic CM.  04/2018 EF 40-45%, grd I DD.  07/2021 EF 55-60%, AV well seated   Chronic pain syndrome    Lumbar DDD; chronic neuropathic pain (DM); has spinal stimulator and sees pain mgmt MD   Complicated UTI (urinary tract infection) 09/2019   Phimoses, acute urinary retention, entoerococcus UTI, enterococc bacteremia.  F/u blood clx's neg x 5d.     COPD    Debilitated patient    Diabetes mellitus type 2 with complications (HCC)    HbA1c jumped from 5.7% to 6.1% 03/2015---started metformin  at that time.  DM 2 dx by fasting gluc criteria 2018.  Has chronic neuropath pain   Enterococcal bacteremia 09/2019   Phimoses, acute urinary retention, entoerococcus UTI, enterococc bacteremia.  F/u blood clx's neg x 5d.     Essential hypertension, benign    Fatty liver 2007   2007 u/s showed fatty liver with hepatosplenomegaly.  2019 repeat u/s->fatty liver but no cirrhosis or hepatosplenomegaly.   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 Fleeta Rothman)   Iron  deficiency anemia    09/2022.  Hemoccults x 3 neg Mar 2024.  Improving with oral iron. GI following, no endoscopies   Lumbosacral neuritis    Lumbosacral spondylosis    Lumbar spinal stenosis with neurogenic claudication--contributes to his chronic pain syndrome   Morbid obesity (HCC)    Normal memory function 08/2014   Neuropsychological testing ([facility]): no cognitive impairment or sign of neurodegenerative disorder.  Likely has adjustment d/o with mixed anxiety/depressed features and may benefit from low dose SNRI.     Normocytic anemia 03/2016   Mild-pt needs ferritin and vit B12 level checked (as of 03/22/16). Hb stable 09/2019.  Hemoccult x 3 NEG 03/2022.  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 chronic?) Off anticoag for a while due to falls.  Then apixaban  started 12/2014.   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 Clorinda): neuropathic pain->diff to treat, failed nucynta , failed spinal stimul trial, oxycontin  hs + tramadol  + gabap as of 12/2017 f/u Dr. Carilyn.   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   Sick sinus syndrome (HCC)    PPM placed   Thrombocytopenia 2018   HSM on 2007 abd u/s---suspect some mild splenic sequestration chronically.   Unspecified glaucoma(365.9)    Unspecified hereditary and idiopathic peripheral neuropathy approx age 35   bilat LE's, ? left arm, too.  Feet became progressively numb + left foot pain intermittently.  Pt may be trying a spinal stimulator (as of 05/2015)   Vaccine counseling 11/16/2021   VENOUS INSUFFICIENCY    Being followed by Dr. Harden as of 10/2016 for two R LL venous stasis ulcers/skin tears.  Healed as of 10/30/16 f/u with Dr. Harden.   Venous stasis dermatitis 12/27/2021   VENTRAL HERNIA     Past Surgical History:  Past Surgical History:  Procedure Laterality Date   AMPUTATION Left 04/11/2013   Procedure: AMPUTATION DIGIT Left 3rd toe;  Surgeon: Jerona LULLA Harden, MD;  Location: [facility] OR;  Service: Orthopedics;  Laterality: Left;  Left 3rd toe amputation at [facility]   AMPUTATION  Left 08/29/2019   Procedure: LEFT TRANSMETATARSAL AMPUTATION;  Surgeon: Harden Jerona GAILS, MD;  Location[facility] OR;  Service: Orthopedics;  Laterality: Left;   BIOPSY  05/04/2020   Procedure: BIOPSY;  Surgeon: Charlanne Groom, MD;  Location: [facility] ENDOSCOPY;  Service: Endoscopy;;   CARDIAC CATHETERIZATION  1997; 03/10/16   1997 Non-obstructive disease.  03/2016 BMS to RCA, with 25% pDiag dz, o/w normal cors per cath 03/07/16.  Cath 11/01/16: in stent restenosis, successful baloon angioplasty. 08/2020 branch vessel dz, cont medical therapy rec'd.   CARDIAC CATHETERIZATION  12/24/2012   mild < 20% LCx, prox 30% RCA; LVEF 55-65% , moderate pulmonary HTN, moderate AS   CARDIAC DEFIBRILLATOR PLACEMENT  11/07/2017   Claria MRI Quad CRT defibrillator   CARDIOVASCULAR STRESS TEST  05/11/16 ([facility])   2017 Myocardial perfusion imaging:  No ischemia; scar in apex, global hypokinesis, EF  36%.  06/15/20->mod primarily fixed inferolat wall defect mildly worse with stress c/w infarct/scar with mild peri-infarct ischemia, normal EF-->for cath per cards.   Carotid dopplers  03/09/2016   [facility]: no hemodynamically significant stenosis on either side.   CHOLECYSTECTOMY     COLONOSCOPY [name] 10/30/2012   Procedure: COLONOSCOPY;  Surgeon: Lupita FORBES Commander, MD;  Location: [facility] ENDOSCOPY;  Service: Endoscopy;  Laterality: [name];   CORONARY ANGIOPLASTY WITH STENT PLACEMENT  03/2016; 04/2017   2017-[facility]: BMS to RCA-pt was placed on Brilinta.  04/2017: DES to RCA.   DORSAL SLIT [name] 10/29/2019   for severe phimosis. Procedure: DORSAL SLIT;  Surgeon: Alvaro Hummer, MD;  Location: [facility] ORS;  Service: Urology;  Laterality: [name];  45 MINS   ESOPHAGEAL DILATION  05/04/2020   Procedure: ESOPHAGEAL DILATION;  Surgeon: Charlanne Groom, MD;  Location: [facility] ENDOSCOPY;  Service: Endoscopy;;   ESOPHAGOGASTRODUODENOSCOPY (EGD) WITH PROPOFOL  [name] 05/04/2020   Procedure: ESOPHAGOGASTRODUODENOSCOPY (EGD) WITH PROPOFOL ;  Surgeon: Charlanne Groom, MD;  Location: [facility] ENDOSCOPY;  Service: Endoscopy;  Laterality: [name];   EYE SURGERY Bilateral cataract   HEMORRHOID SURGERY     INTRAOCULAR LENS INSERTION Bilateral    KNEE SURGERY Right    LEFT AND RIGHT HEART CATHETERIZATION WITH CORONARY ANGIOGRAM [name] 12/24/2012   Procedure: LEFT AND RIGHT HEART CATHETERIZATION WITH CORONARY ANGIOGRAM;  Surgeon: Peter M Jordan, MD;  Location: [name] CATH LAB;  Service: Cardiovascular;  Laterality: [name];   LEG SURGERY Bilateral    lenghtening    PACEMAKER PLACEMENT  04/13/2016   2nd deg HB after TAVR, pt had DC MDT PPM placed.   SHOULDER ARTHROSCOPY  08/30/2011   Procedure: ARTHROSCOPY SHOULDER;  Surgeon: Jerona GAILS Harden, MD;  Location: [facility] OR;  Service: Orthopedics;  Laterality: Right;  Right Shoulder Arthroscopy, Debridement, and Decompression   SPINAL CORD STIMULATOR INSERTION [name] 09/10/2015   Procedure: LUMBAR SPINAL CORD STIMULATOR INSERTION;   Surgeon: Deward Fabian, MD;  Location: [facility] NEURO ORS;  Service: Neurosurgery;  Laterality: [name];   TEE WITHOUT CARDIOVERSION [name] 09/16/2019   Procedure: TRANSESOPHAGEAL ECHOCARDIOGRAM (TEE);  Surgeon: Raford Riggs, MD;  Location: [name] ENDOSCOPY;  Service: Cardiovascular;  Laterality: [name];   TEE WITHOUT CARDIOVERSION [name] 12/02/2019   +vegetation on AVR and pacer lead in RV.  EF 55-60%, normal wall motion.  Valves function normal.  Procedure: TRANSESOPHAGEAL ECHOCARDIOGRAM (TEE);  Surgeon: Barbaraann Darryle Ned, MD;  Location: [name] ENDOSCOPY;  Service: Cardiovascular;  Laterality: [name];   TOE AMPUTATION Left    due to osteomyelitis.  R big toe surg due to osteoarth   TONSILLECTOMY     traeculectomy Left    eye  TRANSCATHETER AORTIC VALVE REPLACEMENT, TRANSFEMORAL  04/11/2016   TRANSESOPHAGEAL ECHOCARDIOGRAM  03/09/2016; 09/2019   [facility]: EF 55-60%, PFO seen with bi-directional shunting, no thrombus in appendage.  09/2019 ->no valvular vegetations. Small patent foramen ovale with predominantly left to right shunting across the interatrial septum.   TRANSESOPHAGEAL ECHOCARDIOGRAM (CATH LAB) [name] 04/15/2024   Procedure: TRANSESOPHAGEAL ECHOCARDIOGRAM;  Surgeon: Mona Vinie BROCKS, MD;  Location: [facility] INVASIVE CV LAB;  Service: Cardiovascular;  Laterality: [name];   TRANSTHORACIC ECHOCARDIOGRAM  01/2015; 01/2016; 05/18/16; 09/18/16, 05/2017, 08/2017   01/2015 No signif change in aortic stenosis (moderate).  01/2016 Severe LVH w/small LV cavity, EF 60-65%, grade I diast dysfxn.  05/2016 (s/p TAVR): EF 50-55%, grd I DD, biopros AV good.  08/2016--EF 50-55%, LV septal motion c/w conduction abnl, grd I DD,mild MS,bioprosth aortic valve well seated, w/trace AR. 05/2017 TTE EF 35%. 08/2017-EF 35%, mod diff hypokin LV, grd I DD, biopros AV good.    TRANSTHORACIC ECHOCARDIOGRAM  04/2018; 09/2019   04/2018: EF 40-45%, mod diffuse LV hypokin, grd I DD, bioprosth AV well seated, no AS or AR. 09/2019 EF 60-65%, grd I DD, valves fine,  including bioprosth AV. 04/29/20 (tech diff) EF 55-60%, grd I DD, vegetation on MV.  07/2021 EF 55-60% (s/p upgrade to BiV PPM), AV well seated.   VITRECTOMY      Medication: Current Facility-Administered Medications  Medication Dose Route Frequency Provider Last Rate Last Admin   acetaminophen  (TYLENOL ) tablet 650 mg  650 mg Oral Q4H PRN Payne, [name] D, PA-C       arformoterol  (BROVANA ) nebulizer solution 15 mcg  15 mcg Nebulization Q12H Payne, [name] D, PA-C       budesonide  (PULMICORT ) nebulizer solution 0.5 mg  0.5 mg Nebulization BID Payne, [name] D, PA-C       Chlorhexidine  Gluconate Cloth 2 % PADS 6 each  6 each Topical Daily Payne, [name] D, PA-C       docusate sodium  (COLACE) capsule 100 mg  100 mg Oral BID PRN Payne, [name] D, PA-C       Gerhardt's butt cream   Topical BID Paliwal, Ria, MD       hydrocortisone  sodium succinate  (SOLU-CORTEF ) 100 MG injection 100 mg  100 mg Intravenous Q12H Tim Scholz, MD       insulin  aspart (novoLOG ) injection 0-6 Units  0-6 Units Subcutaneous Q4H Payne, [name] D, PA-C       ipratropium-albuterol  (DUONEB) 0.5-2.5 (3) MG/3ML nebulizer solution 3 mL  3 mL Nebulization Q4H PRN Payne, [name] D, PA-C       lactated ringers  infusion   Intravenous Continuous Bari Charmaine FALCON, MD   Stopped at 06/06/24 9567   linezolid  (ZYVOX ) IVPB 600 mg  600 mg Intravenous Q12H Claudene Toribio BROCKS, MD       meropenem  (MERREM ) 1 g in sodium chloride  0.9 % 100 mL IVPB  1 g Intravenous Q8H Mattie Marvetta SQUIBB, RPH 200 mL/hr at 06/06/24 0749 1 g at 06/06/24 0749   norepinephrine  (LEVOPHED ) 16 mg in 250mL (0.064 mg/mL) premix infusion  0-40 mcg/min Intravenous Continuous Tim Scholz, MD 23.4 mL/hr at 06/06/24 0700 25 mcg/min at 06/06/24 0700   oxyCODONE  (Oxy IR/ROXICODONE ) immediate release tablet 5 mg  5 mg Oral Q6H PRN Paliwal, Ria, MD   5 mg at 06/06/24 0720   Or   oxyCODONE  (Oxy IR/ROXICODONE ) immediate release tablet 10 mg  10 mg Oral Q6H PRN Paliwal, Aditya, MD       pantoprazole   (PROTONIX ) injection 40 mg  40 mg Intravenous Q12H Payne, [name] D, PA-C   40 mg at 06/06/24 0617   polyethylene glycol (MIRALAX  / GLYCOLAX ) packet 17 g  17 g Oral Daily PRN Payne, [name] D, PA-C       revefenacin  (YUPELRI ) nebulizer solution 175 mcg  175 mcg Nebulization Daily Payne, [name] D, PA-C       sodium chloride  irrigation 0.9 % 3,000 mL  3,000 mL Irrigation Continuous Murphy, Laura A, PA-C       vancomycin  (VANCOCIN ) capsule 500 mg  500 mg Oral Q6H Payne, [name] D, PA-C   500 mg at 06/06/24 0617   vasopressin  (PITRESSIN) 20 Units in 100 mL (0.2 unit/mL) infusion-*FOR SHOCK*  0-0.03 Units/min Intravenous Continuous Payne, [name] D, PA-C 9 mL/hr at 06/06/24 0700 0.03 Units/min at 06/06/24 0700    Allergies: Allergies  Allergen Reactions   Brimonidine Tartrate Shortness Of Breath    Alphagan-Shortness of breath   Brinzolamide Shortness Of Breath    AZOPT- Shortness of breath   Latanoprost Shortness Of Breath    XALATAN- Shortness of breath   Nucynta  [Tapentadol ] Shortness Of Breath   Sulfa Antibiotics Palpitations   Timolol Maleate Shortness Of Breath and Other (See Comments)    TIMOPTIC- Aggravated asthma   Diltiazem Swelling     leg swelling   Rofecoxib Swelling     VIOXX- leg swelling   Vancomycin  Rash    Tolerates oral vanc   Codeine Other (See Comments)    Childhood reaction   Tamsulosin Other (See Comments)    Dizziness    Celecoxib Other (See Comments)    CELLBREX-confusion   Colchicine Diarrhea    diarrhea   Tape Rash    Social History: Social History   Tobacco Use   Smoking status: Never    Passive exposure: Never   Smokeless tobacco: Never  Vaping Use   Vaping status: Never Used  Substance Use Topics   Alcohol  use: No    Alcohol /week: 0.0 standard drinks of alcohol    Drug use: No    Types: Oxycodone     Family History Family History  Problem Relation Age of Onset   Hypertension Mother    Coronary artery disease Mother    Heart attack Mother     Neuropathy Mother    Pulmonary fibrosis Father        asbestosis    Review of Systems  Unable to perform ROS: Critical illness     Objective   Vital signs in last 24 hours: BP (!) 108/56   Pulse (!) 129   Temp 99.3 F (37.4 C) (Temporal)   Resp (!) 21   Ht 6' 4 (1.93 m)   Wt 115 kg   SpO2 98%   BMI 30.86 kg/m   Physical Exam General: A&O, resting, appropriate HEENT: [place]/AT Pulmonary: Normal work of breathing Cardiovascular: no cyanosis Abdomen: Soft, NTTP, nondistended GU: 3f three-way hematuria coud catheter in place on low gtt. draining light pink irrigant   Most Recent Labs: Lab Results  Component Value Date   WBC 29.9 (H) 06/06/2024   HGB 11.4 (L) 06/06/2024   HCT 34.3 (L) 06/06/2024   PLT 61 (L) 06/06/2024    Lab Results  Component Value Date   NA 136 06/06/2024   K 3.5 06/06/2024   CL 107 06/06/2024   CO2 21 (L) 06/06/2024   BUN 18 06/06/2024   CREATININE 1.42 (H) 06/06/2024   CALCIUM  7.6 (L) 06/06/2024   MG 0.9 (LL) 06/06/2024   PHOS 3.8 04/11/2024  Lab Results  Component Value Date   INR 3.7 (H) 06/06/2024   APTT 33 04/10/2024     Urine Culture: @[id number] (laburin,org,[id number],[id number])@   IMAGING: DG Chest Port 1 View Result Date: 06/06/2024 EXAM: 1 VIEW(S) XRAY OF THE CHEST 06/06/2024 12:55:00 AM COMPARISON: Comparison with 04/10/2024. CLINICAL HISTORY: Questionable sepsis - evaluate for abnormality. FINDINGS: LINES, TUBES AND DEVICES: TAVR. Left chest wall icd. Spine stimulator leads project over the thoracic spine. LUNGS AND PLEURA: The right apex is obscured. No focal pulmonary opacity. No pleural effusion. No pneumothorax. HEART AND MEDIASTINUM: Stable cardiomediastinal silhouette given the patient rotation. BONES AND SOFT TISSUES: No acute osseous abnormality. IMPRESSION: 1. No acute cardiopulmonary abnormality identified. Electronically signed by: Norman Gatlin MD 06/06/2024 01:00 AM EST RP Workstation: [id number]     ------  Ole Bourdon, NP Pager: [phone]   Please contact the urology consult pager with any further questions/concerns.

## 2024-06-06 NOTE — Progress Notes (Signed)
 Patient seen, examined and chart reviewed  In brief 83 year old male was admitted early this morning with septic shock, currently on Levophed  at 25 mics and vasopressin .  Lactate is trending down, currently at 2.8 down from 5 upon initial presentation He continued to have hematuria   Physical exam: General: Critically ill-appearing obese elderly male, lying on the bed HEENT: /AT, eyes anicteric.  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 on chronic antibiotic with amoxicillin  Lactic acidosis Anemia of critical illness Shock liver Coagulopathy of liver disease versus DIC Thrombocytopenia of critical illness versus DIC Acute kidney injury due to septic ATN Congestive heart failure was ruled out  Continue broad-spectrum antibiotics with Zyvox  and meropenem  Follow-up cultures and adjust antibiotic accordingly Continue vasopressor support with MAP goal 65 currently on 25 mics of Levophed  and vasopressin  Appreciate urology follow-up, patient is getting Foley replacement and possible CBI Lactate is trending down I do not see any active bleeding, hemorrhagic shock was ruled out Monitor H&H and platelet count LFTs are trending up in the setting of shock, closely monitor DIC panel is pending Watch for signs of bleeding Serum creatinine started trending down, Avoid nephrotoxic agent Monitor intake and output Echocardiogram in  October 2025 showing normal EF but persistent calcified mobile mass on mitral valve   The patient is critically ill due to severe sepsis with septic shock requiring titration of vasopressor support and working up for DIC.  Critical care was necessary to treat or prevent imminent or life-threatening deterioration.  Critical care was time spent personally by me on the following activities: development of treatment plan with patient and/or surrogate as well as nursing, discussions with consultants, evaluation of patient's response to treatment, examination of patient, obtaining history from patient or surrogate, ordering and performing treatments and interventions, ordering and review of laboratory studies, ordering and review of radiographic studies, pulse oximetry, re-evaluation of patient's condition and participation in multidisciplinary rounds.   During this encounter critical care time was devoted to patient care services described in this note for 35 minutes.     Valinda Novas, MD [facility] See Amion for pager If no response to pager, please call [phone] until 7pm After 7pm, Please call E-link [phone]

## 2024-06-06 NOTE — Sepsis Progress Note (Signed)
 Notified bedside nurse of need to draw repeat lactic acid.

## 2024-06-06 NOTE — Progress Notes (Signed)
 RT unable to get abg on pt. Attempted 2x. RN aware.

## 2024-06-06 NOTE — ED Provider Notes (Signed)
 Patient presents with altered mental status and fever.  Wife reports recent [name] infection.  He has also had several recent hospitalizations but reports he had been progressively improving.  On my initial evaluation he is quite ill-appearing.  Low blood pressure.  Tachycardia.  Appears septic.  Broad-spectrum antibiotics and IV fluids were ordered.  Placed an ultrasound-guided IV.  Patient with recent catheter placement and gross hematuria.  Minimal catheter output while in the emergency department.  Based on venous collapse, appears very significantly volume down.  Long discussion with wife regarding goals of care.  I reviewed his living will and given this information, decision was made to make him DNR.  Patient had persistently low blood pressures.  Started peripheral Levophed  and consulted with ICU for admission.  Urinalysis is difficult to interpret given gross hematuria.  No obvious pneumonia.  Of note, on repeat evaluations, patient favored holding his neck turn to the right and flexed slightly.  Wife states that this is abnormal for him.  He will not extend his neck backwards and reports pain.  Made sure to cover for meningitis although I do not think he is stable enough to lay flat for an LP.  Physical Exam  BP (!) 84/50   Pulse (!) 116   Temp 99.3 F (37.4 C) (Temporal)   Resp (!) 29   Ht 1.93 m (6' 4)   Wt 115 kg   SpO2 100%   BMI 30.86 kg/m   Physical Exam Ill-appearing Neck flexed forward and axilla rotated to the right Coarse breath sounds bilaterally Lower extremity edema Left foot amputation  Procedures  .Ultrasound ED Peripheral IV (Provider)  Date/Time: 06/06/2024 4:33 AM  Performed by: Bari Charmaine FALCON, MD Authorized by: Bari Charmaine FALCON, MD   Procedure details:    Indications: hypotension and multiple failed IV attempts     Skin Prep: chlorhexidine  gluconate     Location: Right proximal arm.   Angiocath:  18 G   Bedside Ultrasound Guided: Yes      Images: not archived     Patient tolerated procedure without complications: Yes     Dressing applied: Yes   .Critical Care  Performed by: Bari Charmaine FALCON, MD Authorized by: Bari Charmaine FALCON, MD   Critical care provider statement:    Critical care time (minutes):  31   Critical care was necessary to treat or prevent imminent or life-threatening deterioration of the following conditions:  Sepsis   Critical care was time spent personally by me on the following activities:  Development of treatment plan with patient or surrogate, discussions with consultants, evaluation of patient's response to treatment, examination of patient, ordering and review of laboratory studies, ordering and review of radiographic studies, ordering and performing treatments and interventions, pulse oximetry, re-evaluation of patient's condition and review of old charts   ED Course / MDM   Clinical Course as of 06/06/24 0430  Fri Jun 06, 2024  0017 Patient quite ill-appearing.  Now with blood pressures in the 50s to 70s.  Full 30 cc/kg ordered.  Tachycardic with frequent PVCs.  Recent Foley change today with hematuria noted. [CH]  0150 I had a long discussion with the patient's wife at the bedside.  She states that he has been adamant that he would not want to be on a ventilator.  Initially she indicated that she would want him to have CPR; however I discussed with her that this is not compatible with his wishes to not be on a ventilator.  If patient were to require life-saving measures with CPR and advanced ACLS, he would likely end up on a ventilator.  Wife was able to provide his LivingWell to me.  It is very clear that he does not want advanced measures and irreversible conditions.  Based on this and discussions with wife, decision was to make him DNR. [CH]    Clinical Course User Index [CH] Avynn Klassen, Charmaine FALCON, MD   Medical Decision Making Amount and/or Complexity of Data Reviewed Labs: ordered. Radiology:  ordered.  Risk Prescription drug management. Decision regarding hospitalization.   Problem List Items Addressed This Visit       Genitourinary   AKI (acute kidney injury)     Other   * (Principal) Septic shock (HCC) - Primary   Relevant Medications   vancomycin  (VANCOREADY) IVPB 2000 mg/400 mL   vancomycin  (VANCOCIN ) capsule 500 mg (Start on 06/06/2024  6:00 AM)   metroNIDAZOLE  (FLAGYL ) IVPB 500 mg   ceFEPIme  (MAXIPIME ) 2 g in sodium chloride  0.9 % 100 mL IVPB (Start on 06/06/2024  8:00 AM)   Gerhardt's butt cream (Start on 06/06/2024 10:00 AM)   Other Visit Diagnoses       Lactic acidosis         Encephalopathy, unspecified type         Transaminitis                 Bari Charmaine FALCON, MD 06/06/24 [phone]

## 2024-06-06 NOTE — Sepsis Progress Note (Signed)
 Elink monitoring for the code sepsis protocol.

## 2024-06-06 NOTE — Procedures (Signed)
   Procedure: insert indwelling catheter bedside   Urology Procedure Note:  83 year old male with chronic indwelling Foley, PMH for dorsal slit and urinary incontinence.  Admitted to cardiac intensive care unit with septic shock and suspected DIC.  Three-way catheter for continuous bladder irrigation.  On arrival patient was alert but resting.  After discussing the procedure, shared decision was made to proceed with three-way Foley catheter placement and CBI.    Foley catheter with scant dark red blood, clearly displaced into the prostate or bulbar urethra.  10 mL balloon was evacuated and catheter was removed without difficulty, followed by rush of frank blood.  Patient was prepped and draped in the usual sterile fashion.  Dorsal slit had largely closed due to generalized edema but meatus was able to be visualized.  10 mL of lidocaine  infused lubricant was injected directly into the meatus.  After movement had been provided for anesthetic effect, a 69f three-way hematuria coud catheter was advanced into the bladder without difficulty.  Placement was confirmed with hand irrigation, which flushed qualitatively and quantitatively.  No clot material was returned.  Having confirmed placement, 30 cc of sterile water  was injected into the retention balloon.  Catheter was placed to gravity drainage without dependent loops, concluding the procedure.  Further hand irrigation did not return any clot material and CBI was started at a low gtt.  Reviewed goals and troubleshooting with nursing.  Please call with questions  Ole Bourdon, NP [facility] Pager: [phone]

## 2024-06-06 NOTE — Procedures (Addendum)
 Central Venous Catheter Insertion Procedure Note  Tim [name]  [id number]  1941/02/16  Date:06/06/24  Time:5:38 AM   Provider Performing:Kazmir Oki D Emilio   Procedure: Insertion of Non-tunneled Central Venous [phone]) with US  guidance (23062)   Indication(s) Medication administration  Consent Risks of the procedure as well as the alternatives and risks of each were explained to the patient and/or caregiver.  Consent for the procedure was obtained and is signed in the bedside chart  Anesthesia Topical only with 1% lidocaine    Timeout Verified patient identification, verified procedure, site/side was marked, verified correct patient position, special equipment/implants available, medications/allergies/relevant history reviewed, required imaging and test results available.  Sterile Technique Maximal sterile technique including full sterile barrier drape, hand hygiene, sterile gown, sterile gloves, mask, hair covering, sterile ultrasound probe cover (if used).  Procedure Description Area of catheter insertion was cleaned with chlorhexidine  and draped in sterile fashion.  With real-time ultrasound guidance a central venous catheter was placed into the left femoral vein. Nonpulsatile blood flow and easy flushing noted in all ports.  The catheter was sutured in place and sterile dressing applied.  Complications/Tolerance None; patient tolerated the procedure well. Chest X-ray is ordered to verify placement for internal jugular or subclavian cannulation.   Chest x-ray is not ordered for femoral cannulation.  EBL Minimal  Specimen(s) None  JD Emilio RIGGERS [facility] 06/06/2024, 5:38 AM  Please see [email or web address] for pager details.  From 7A-7P if no response, please call [phone]. After hours, please call ELink [phone].

## 2024-06-06 NOTE — Consult Note (Signed)
 Consultation Note Date: 06/06/2024   Patient Name: Tim [name]  DOB: [birth year]  MRN: [id number]  Age / Sex: 83 y.o., male  PCP: [name], MD Referring Physician: Harold Scholz, MD  Reason for Consultation: Establishing goals of care  HPI/Patient Profile: 83 y.o. 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   Patient has history of enterococcal infection of the AICD lead that is being treated with chronic amoxicillin .  Patient recently admitted at [facility] from 10/9-10/17 with mixed hypovolemic/septic shock.  Also noted patient has been admitted to [facility] from 10/29-11/1 with [name] and was treated with vancomycin .  Wife states he has poor oral intake.  On 06/05/2024 overnight patient had high fever.  EMS called found temperature of 103.7.  Patient has chronic Foley in place with hematuria, Foley replaced.  Despite IV fluids remains hypotensive, therefore started on levo drip.  Transferred to ICU services for intensive care management.  Worth to note that patient has had 2 inpatient admissions in the last 6 months.  PMT has been consulted to assist with goals of care conversation. Patient/Family face treatment option decisions, advanced directive decisions and anticipatory care needs.   Family face treatment option decision, advance directive decisions and anticipatory care needs.   Clinical Assessment and Goals of Care:  I have reviewed medical records including: EPIC notes: Reviewed critical care progress note from 06/06/2024, patient is currently being treated for septic shock, currently on Levophed  at 25 mics and  vasopressin .  Thankfully lactate is trending down currently at 2.8 from 5 upon initial presentation.  Patient is also being worked up for DIC.  Reviewed consult note from urology, patient currently having gross hematuria likely related to traumatic Foley placement, patient was then placed on three-way hematuria coud with continuous bladder irrigation. MAR: Currently receiving IV antibiotics, levophed  29 mcg/min, pitressin 0.03 units/min. Also received PRN oxycodone  today at 0720 for moderate pain.  VS: Reviewed today at 1115: BP 133/56, MAP 79, HR 129, RR 23, 98.5 F Available advanced directives in ACP: Yes, living will Labs: 06/06/2024 WBC significantly increased to 35.2 from 6.7 initial WBC in the setting of septic shock, hemoglobin stable at 11.9. 06/06/2024 PLT significantly low at 65, d-dimer >20, fibrinogen <60, PT 26.4, INR 2.3, APTT 61, values supportive of DIC.  His latest (06/06/24 0835) lactic acid is improved, down to 2.6 from 5 early this morning.  Lab values reviewed for prognostication.   Met with patient/family to discuss diagnosis prognosis, GOC, EOL wishes, disposition and options.  I introduced Palliative Medicine as specialized medical care for people living with serious illness. It focuses on providing relief from the symptoms and stress of a serious illness. The goal is to improve quality of life for both the patient and the family.  Created space and opportunity for family to explore thoughts and feelings regarding patient's current medical condition.   I visited patient at bedside today, wife was present.  The patient is resting comfortably in a semi fowler-s position and showed no signs of distress. He is receiving O2 supplement at 4LPM/[place], maintaining saturation at 99%. Continuous bladder irrigation is ongoing, and the urine is gradually clearing.   The patient was briefly awake during my assessment and was able  to answer basic orientation questions. He demonstrated awareness of  his hospital admission and intensive care services being provided. I explained to him that he is critically ill due to septic shock and DIC, and he acknowledge understanding of the situation. We reviewed his HCPOA and living will, and he re affirmed his wishes. Should his condition deteriorate further, he prefers to focus on comfort and quality of life rather than pursue life-prolonging interventions. His wife was present during this discussion and participated in the conversation regarding his healthcare preferences.   I also had an in-depth discussion with his wife about his acute and chronic medical conditions.  We reviewed that he is currently admitted for severe septic shock and possible DIC and that we are managing additional issues such as electrolyte imbalances.  She demonstrated a clear understanding of the seriousness of his condition, recognizing that short and long-term prognosis are poor, and that meaningful recovery is unlikely.  She shared that his health has been declining progressively over the past 6 months, particularly in terms of functional capacity, and that he has been hospitalized multiple times recently for various issues including [name] colitis, shock and cardiac problems.  She identified mobility is his greatest challenge.    The overall severity of the patient's illness was discussed.  It was explained that the patient is experiencing a significant burden of disease, with multiple chronic conditions and an acute illness contributing to a complex clinical picture.  The discussion included education about high risk for further health decline, potential for setbacks, and the likely with recurrent hospitalizations.  The patient and wife were informed that, even with recovery from the current episode, the overall prognosis remains guarded due to the cumulative impact of ongoing health challenges.  We discussed his goals of care in detail.  His wife expressed that his health  problems have been continuous over the past 3 months and acknowledge the ongoing decline.  When asked about his healthcare preferences, she reiterated that he would not want extraordinary or heroic measures, and would prefer comfort focused care if his condition has to worsen.  Current plan is to continue medical interventions, as both the patient and his wife wish to allow some time for potential clinical improvement.  However, they are in agreement that if his condition deteriorates, the focus will shift to comfort care.  I coordinated this plan with the ICU nursing staff and Dr. Harold, the attending MD. Discussed that for now patient is to maintain current DNR-limited status, and to continue with present medical interventions, with a transition to comfort-focused care if the patient's condition declines, in accordance with his expressed wishes.  We talked about transition to comfort measures in house and what would entail inclusive of medication to control pain, dyspnea, agitation, nausea, itching, and hiccups.  We discussed stopping all unnecessary measures such as cardiac monitoring, blood draws, needlesticks, and frequent vital signs.  I utilized reflective listening throughout the interaction.   We discussed the importance of establishing clear goals of care, ensuring that all interventions and treatments align with the patient's beliefs, values, and preferences. The patient and family were encouraged to share their wishes and priorities, and we reviewed how our care plan can best support these goals, whether that involves pursuing aggressive interventions or focusing on comfort measures.  I provided education on the philosophy of hospice and palliative care, highlighting the focus on symptom management, comfort, and support for both patient and family. We discussed the different settings in which these  services can be provided, including home hospice and residential hospice facilities, and the types  of support available in each.  The family was informed that hospice is a federal Medicare benefit, and I reviewed what hospice services entail, including interdisciplinary support (nursing, social work, building services engineer), medication management, equipment provision, and respite care. I clarified eligibility criteria and the process for enrollment.  Discussed the importance of continued conversation with family and their  medical providers regarding overall plan of care and treatment options, ensuring decisions are within the context of the patients values and GOCs. The patient, family, and medical team were encouraged to revisit these discussions regularly, as preferences and circumstances may change over time.   The difference between aggressive medical intervention and comfort care was considered in light of the patient's goals of care. Hospice and Palliative Care services outpatient were explained and offered. Natural trajectory and expectations at EOL were discussed.  Questions and concerns addressed.  Patient  encouraged to call with questions or concerns.     Discussed the importance of continued conversation with family and the medical providers regarding overall plan of care and treatment options, ensuring decisions are within the context of the patient's values and GOCs.   Questions and concerns were addressed.  Hard Choices booklet left for review. The family was encouraged to call with questions or concerns.  PMT will continue to support holistically.  Social History: Patient lives with his wife. Married for 38 years. Has a daughter that is out of state. He worked with [employer] and [employer] for many years, retired last 2002. He is a technical sales engineer, loves teacher, music.   Functional and Nutritional State: Patient is wheelchair bound at baseline. Mobility has been progressively declining over the last 6 months.   Palliative Symptoms: Progressive weakness, loss of appetite, confusion  Advance  Directives: A detailed discussion regarding advanced directives was held with the patient and family today, highlighting the benefits of having clear documentation to guide care in accordance with the patient's wishes. The patient has previously completed advanced directive documents. Wife provided a copy for inclusion in our medical records to ensure their preferences are honored.   Code Status: DNR-Limited  Primary Decision Maker: Wife (HCPOA) [name] Stage   SUMMARY OF RECOMMENDATIONS   Code Status: Maintain DNR/DNI status DNR form completed, HCPOA and living will document placed on patient's hard file. Continue with current medical interventions, allow time for outcomes, in hopes for improvement. Recommends and as per patient's wishes/preferences, to transition to comfort-focused care if his health further declines.  Continue to provide psycho-social and emotional support to patient and family Palliative medicine team will continue to follow.   Symptom Management: Per Primary team Palliative medicine is available to assist as needed.    Palliative Prophylaxis:  Aspiration, Bowel Regimen, Delirium Protocol, Frequent Pain Assessment, and Turn Reposition   Prognosis:  Prognosis is poor given high disease burden.  Discharge Planning: To Be Determined      Primary Diagnoses: Present on Admission:  Septic shock [facility])    Physical Exam Vitals and nursing note reviewed.  Constitutional:      Appearance: He is ill-appearing.  HENT:     Mouth/Throat:     Mouth: Mucous membranes are moist.  Cardiovascular:     Rate and Rhythm: Tachycardia present.  Pulmonary:     Effort: Pulmonary effort is normal.     Comments: Os supplement at 4LPM/[place] Abdominal:     General: Abdomen is flat.  Genitourinary:    Comments:  Foley with ongoing CBI  Musculoskeletal:     Cervical back: Normal range of motion.     Comments: Generalized weakness, left toes amputatee  Skin:    General: Skin  is warm.     Comments: Scattered significant bruises, more notable in patient's BUE.  Neurological:     Comments: Drowsy     Vital Signs: BP (!) 108/56   Pulse (!) 129   Temp 99.3 F (37.4 C) (Temporal)   Resp (!) 21   Ht 6' 4 (1.93 m)   Wt 115 kg   SpO2 98%   BMI 30.86 kg/m  Pain Scale: 0-10   Pain Score: 5    SpO2: SpO2: 98 % O2 Device:SpO2: 98 % O2 Flow Rate: .O2 Flow Rate (L/min): 4 L/min  Palliative Assessment/Data: 30%    I personally spent a total of 90 minutes in the care of the patient today including preparing to see the patient, getting/reviewing separately obtained history, performing a medically appropriate exam/evaluation, counseling and educating, referring and communicating with other health care professionals, documenting clinical information in the EHR, independently interpreting results, and coordinating care.     Kathlyne JULIANNA Tracie Mickey, NP  Palliative Medicine Team Team phone # [phone]  Thank you for allowing the Palliative Medicine Team to assist in the care of this patient. Please utilize secure chat with additional questions, if there is no response within 30 minutes please call the above phone number.  Palliative Medicine Team providers are available by phone from 7am to 7pm daily and can be reached through the team cell phone.  Should this patient require assistance outside of these hours, please call the patient's attending physician.

## 2024-06-06 NOTE — Procedures (Addendum)
 Arterial Catheter Insertion Procedure Note  Tim [name]  [id number]  31-Jul-1940  Date:06/06/24  Time:5:39 AM    Provider Performing: Tim [name]    Procedure: Insertion of Arterial Line (63379) with US  guidance (23062)   Indication(s) Blood pressure monitoring and/or need for frequent ABGs  Consent Risks of the procedure as well as the alternatives and risks of each were explained to the patient and/or caregiver.  Consent for the procedure was obtained and is signed in the bedside chart  Anesthesia None   Time Out Verified patient identification, verified procedure, site/side was marked, verified correct patient position, special equipment/implants available, medications/allergies/relevant history reviewed, required imaging and test results available.   Sterile Technique Maximal sterile technique including full sterile barrier drape, hand hygiene, sterile gown, sterile gloves, mask, hair covering, sterile ultrasound probe cover (if used).   Procedure Description Area of catheter insertion was cleaned with chlorhexidine  and draped in sterile fashion. With real-time ultrasound guidance an arterial catheter was placed into the left femoral artery.  Appropriate arterial tracings confirmed on monitor.     Complications/Tolerance None; patient tolerated the procedure well.   EBL Minimal   Specimen(s) None  Tim [name] [facility] 06/06/2024, 5:39 AM  Please see [email or web address] for pager details.  From 7A-7P if no response, please call [phone]. After hours, please call ELink [phone].

## 2024-06-07 LAB — COMPREHENSIVE METABOLIC PANEL WITH GFR
ALT: 110 U/L — ABNORMAL HIGH (ref 0–44)
AST: 274 U/L — ABNORMAL HIGH (ref 15–41)
Albumin: 2.1 g/dL — ABNORMAL LOW (ref 3.5–5.0)
Alkaline Phosphatase: 81 U/L (ref 38–126)
Anion gap: 11 (ref 5–15)
BUN: 23 mg/dL (ref 8–23)
CO2: 18 mmol/L — ABNORMAL LOW (ref 22–32)
Calcium: 7.8 mg/dL — ABNORMAL LOW (ref 8.9–10.3)
Chloride: 104 mmol/L (ref 98–111)
Creatinine, Ser: 1.22 mg/dL (ref 0.61–1.24)
GFR, Estimated: 58 mL/min — ABNORMAL LOW (ref >60)
Glucose, Bld: 180 mg/dL — ABNORMAL HIGH (ref 70–99)
Potassium: 4.2 mmol/L (ref 3.5–5.1)
Sodium: 133 mmol/L — ABNORMAL LOW (ref 135–145)
Total Bilirubin: 1.1 mg/dL (ref 0.0–1.2)
Total Protein: 4.9 g/dL — ABNORMAL LOW (ref 6.5–8.1)

## 2024-06-07 LAB — GLUCOSE, CAPILLARY
Glucose-Capillary: 188 mg/dL — ABNORMAL HIGH (ref 70–99)
Glucose-Capillary: 151 mg/dL — ABNORMAL HIGH (ref 70–99)
Glucose-Capillary: 164 mg/dL — ABNORMAL HIGH (ref 70–99)
Glucose-Capillary: 164 mg/dL — ABNORMAL HIGH (ref 70–99)
Glucose-Capillary: 191 mg/dL — ABNORMAL HIGH (ref 70–99)
Glucose-Capillary: 192 mg/dL — ABNORMAL HIGH (ref 70–99)

## 2024-06-07 LAB — CBC
HCT: 32.6 % — ABNORMAL LOW (ref 39.0–52.0)
Hemoglobin: 11.3 g/dL — ABNORMAL LOW (ref 13.0–17.0)
MCH: 33.2 pg (ref 26.0–34.0)
MCHC: 34.7 g/dL (ref 30.0–36.0)
MCV: 95.9 fL (ref 80.0–100.0)
Platelets: 45 K/uL — ABNORMAL LOW (ref 150–400)
RBC: 3.4 MIL/uL — ABNORMAL LOW (ref 4.22–5.81)
RDW: 15.3 % (ref 11.5–15.5)
WBC: 46.8 K/uL — ABNORMAL HIGH (ref 4.0–10.5)
nRBC: 0 % (ref 0.0–0.2)

## 2024-06-07 LAB — PROTIME-INR
INR: 2.3 — ABNORMAL HIGH (ref 0.8–1.2)
Prothrombin Time: 26.1 s — ABNORMAL HIGH (ref 11.4–15.2)

## 2024-06-07 LAB — MAGNESIUM: Magnesium: 1.9 mg/dL (ref 1.7–2.4)

## 2024-06-07 MED ORDER — LIDOCAINE 5 % EX PTCH
1.0000 | MEDICATED_PATCH | CUTANEOUS | Status: DC
  Administered 2024-06-07 – 2024-06-25 (×17): 1 via TRANSDERMAL
  Filled 2024-06-07 (×16): qty 1

## 2024-06-07 MED ORDER — SODIUM BICARBONATE 8.4 % IV SOLN
100.0000 meq | Freq: Once | INTRAVENOUS | Status: AC
  Administered 2024-06-07: 100 meq via INTRAVENOUS
  Filled 2024-06-07: qty 100

## 2024-06-07 MED ORDER — MAGNESIUM SULFATE 2 GM/50ML IV SOLN
2.0000 g | Freq: Once | INTRAVENOUS | Status: AC
  Administered 2024-06-07: 2 g via INTRAVENOUS
  Filled 2024-06-07: qty 50

## 2024-06-07 MED ORDER — DICLOFENAC SODIUM 1 % EX GEL
2.0000 g | Freq: Four times a day (QID) | CUTANEOUS | Status: DC | PRN
  Administered 2024-06-13 – 2024-06-22 (×2): 2 g via TOPICAL
  Filled 2024-06-07: qty 100

## 2024-06-07 NOTE — Progress Notes (Signed)
 Notified per [facility] platelet value of 45, previously 71. Both non-critical values. Will cont to monitor.

## 2024-06-07 NOTE — Progress Notes (Signed)
 Palliative Medicine Inpatient Follow Up Note   HPI:  83 y.o. 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    Patient has history of enterococcal infection of the AICD lead that is being treated with chronic amoxicillin .  Patient recently admitted at [facility] from 10/9-10/17 with mixed hypovolemic/septic shock.  Also noted patient has been admitted to [facility] from 10/29-11/1 with [name] and was treated with vancomycin .  Wife states he has poor oral intake.  On 06/05/2024 overnight patient had high fever.  EMS called found temperature of 103.7.  Patient has chronic Foley in place with hematuria, Foley replaced.  Despite IV fluids remains hypotensive, therefore started on levo drip.  Transferred to ICU services for intensive care management.   Worth to note that patient has had 2 inpatient admissions in the last 6 months.   PMT has been consulted to assist with goals of care conversation. Patient/Family face treatment option decisions, advanced directive decisions and anticipatory care needs.    Family face treatment option decision, advance directive decisions and anticipatory care needs.    Today's Discussion 06/07/2024   I have reviewed medical records including: EPIC notes:  Reviewed Dr. Hugh progress note from 06/06/2024, blood culture growing e coli and klebsiella MDR, plan to continue on meronem.  Vital signs: 06/07/2024 at 0845: HR 119, RR 21, O2 94 RA, BP 114/79 MAR: Reviewed over the last 24 hours. Continued with IV antibiotics, levo at 14 mcg/min, vasopressin  0.03 units/min Available advanced directives in ACP:  Labs: 06/07/2024 WBC increased from 44.1  to 46.8, ongoing leukocytosis, ongoing IV antibiotics, platelets decreased to 45 from 71 yesterday, bleeding precautions, patient being treated for DIC. Reviewed for prognostication.   Met with patient to discuss diagnosis prognosis, GOC, EOL wishes, disposition and options.  Coordinated the plan of care with bedside RN, who reported that patient is more alert and conversant today compared to yesterday.  The patient appears more anxious now that he is more aware of his situation.  He expressed concerns about chest pain, which the RN already communicated to the critical care MD.  During my bedside assessment, no family members were present.  The patient appeared chronically ill but was not in any acute distress, lying comfortably in high Fowlers position on room air.  He denied any pain or discomfort and showed no signs or gestures indicating distress.  He was able to converse meaningfully and communicate his needs effectively.  The patient shared that the patient shared that, although the critical care doctor conveyed that his condition looks better today, he personally does not feel any improvement yet.  He remains hopeful for recovery and eventual discharge home with his wife.  When asked about his worries, he expressed concern about further health decline.  He reiterated his previous statement that, should his health continue to deteriorate, he would prefer to pursue comfort measures rather than aggressive interventions which may not change the overall outcome.  I briefly discussed the concept of palliative and hospice care with him.  The patient demonstrated a good understanding of hospice, noting positive experiences with family members who had received hospice care in the past.  He stated that if his condition improves and he j is discharged home, he  would like to utilize outpatient palliative services, with the understanding that he could transition to hospice care if his health worsens in the  future.  I affirmed that this is a reasonable plan.  The goals of care remain unchanged.  Continue current medical interventions and allow time for potential improvement.  If his condition worsens during this hospitalization, he is open to transitioning to comfort measures.  If he improves and is able to return home, he is interested in outpatient palliative services (with option to transition to hospice care if needed), home health PT and OT.   Created space and opportunity for patient to explore thoughts feelings and fears regarding current medical situation.  Patient and his family face treatment option decisions, advanced directive decisions and anticipatory care needs.   Questions and concerns addressed   Palliative Support Provided.   Objective Assessment: Vital Signs Vitals:   06/07/24 0830 06/07/24 0845  BP:    Pulse: (!) 124 (!) 118  Resp: (!) 21 (!) 21  Temp:    SpO2: 93% 94%    Intake/Output Summary (Last 24 hours) at 06/07/2024 0851 Last data filed at 06/07/2024 0800 Gross per 24 hour  Intake 5318.79 ml  Output 2295 ml  Net 3023.79 ml   Last Weight  Most recent update: 06/07/2024  5:35 AM    Weight  113.4 kg (250 lb)             Gen: Ill-appearing, not in any form of acute distress. HEENT: mucous membranes are moist CV: tachycardia, HR 118 PULM: clear to auscultation bilaterally.  ABD: soft, nontender SKIN: scattered bruises, more pronounced on BLE EXT: Swelling on all four extremities Neuro: alert and oriented x3.   SUMMARY OF RECOMMENDATIONS    Code Status: Maintain DNR/DNI status Continue with current medical interventions, allow time for outcomes, and hopes for improvement. Recommends and as per patient's wishes, to transition to comfort focused care if his health further declines. Continue to provide psycho-social and emotional support to patient and family Palliative medicine team will continue to follow.   Symptom Management: Per Primary  team Palliative medicine is available to assist as needed.    I personally spent a total of 50 minutes in the care of the patient today including preparing to see the patient, getting/reviewing separately obtained history, performing a medically appropriate exam/evaluation, counseling and educating, referring and communicating with other health care professionals, documenting clinical information in the EHR, independently interpreting results, communicating results, and coordinating care.   ______________________________________________________________________________________ Kathlyne Bolder NP-C [facility] Palliative Medicine Team Team Cell Phone: [phone] Please utilize secure chat with additional questions, if there is no response within 30 minutes please call the above phone number  Palliative Medicine Team providers are available by phone from 7am to 7pm daily and can be reached through the team cell phone.  Should this patient require assistance outside of these hours, please call the patient's attending physician.

## 2024-06-07 NOTE — Progress Notes (Signed)
 S: No urologic complaints.  O: He is alert and oriented, Foley catheter in place with clear urine.  No hematuria or clots.  A/P: Chronic Foley-urine now clear off CBI.  Continue Foley to gravity.

## 2024-06-07 NOTE — Progress Notes (Signed)
 NAME:  Tim [name], MRN:  [id number], DOB:  04/16/1941, LOS: 1 ADMISSION DATE:  06/05/2024, CONSULTATION DATE:  12/5 REFERRING MD:  Dr. 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  Patient has history of enterococcal infection of the AICD lead that is being treated with chronic amoxicillin .  Patient recently admitted to [facility] on 10/9-10/17 with mixed hypovolemic/septic shock.  Patient been admitted to [facility] on 10/29-11/1 with [name] treated w/ vanc. Wife states he has not had diarrhea symptoms and off po vanc. Did have diarrhea on 12/3 and so did wife. Wife also states he has poor po intake. On 12/4 overnight patient had high fever. EMS called found temp of 103.7. States currently on po vanc for c diff. On arrival to [facility] ED temp 99.3 F and wbc 6.7. LA 5. Chronic foley in place w/ hematuria which was replaced. UA inconclusive. INR 3.7. CXR unremarkable. Cultures obtained and started on broad spectrum abx. Despite fluids remains hypotensive started on levo. PCCM consulted.   Pertinent  Medical History   Past Medical History:  Diagnosis Date   AICD (automatic cardioverter/defibrillator) present    Balanitis    +severe phimosis+ buried penis->circ not possible but dorsal slit done 10/2019   BPH (benign prostatic hypertrophy)    with urinary retention.  Renal u/s 12/04/19 NORMAL KIDNEYS, NORMAL BLADDER, NO HYDRONEPHROSIS   CAD (coronary artery disease)    Nonobstructive by 12/2012 cath; then 03/2016 he required BMS to RCA ([facility]).  In-stent restenosis on cath 11/01/16, baloon angioplasty successful.   CATARACT, HX OF    Chronic combined systolic and diastolic CHF (congestive heart failure)  (HCC) 05/2016   Ischemic CM.  04/2018 EF 40-45%, grd I DD.  07/2021 EF 55-60%, AV well seated   Chronic pain syndrome    Lumbar DDD; chronic neuropathic pain (DM); has spinal stimulator and sees pain mgmt MD   Complicated UTI (urinary tract infection) 09/2019   Phimoses, acute urinary retention, entoerococcus UTI, enterococc bacteremia.  F/u blood clx's neg x 5d.     COPD    Debilitated patient    Diabetes mellitus type 2 with complications (HCC)    HbA1c jumped from 5.7% to 6.1% 03/2015---started metformin  at that time.  DM 2 dx by fasting gluc criteria 2018.  Has chronic neuropath pain   Enterococcal bacteremia 09/2019   Phimoses, acute urinary retention, entoerococcus UTI, enterococc bacteremia.  F/u blood clx's neg x 5d.     Essential hypertension, benign    Fatty liver 2007   2007 u/s showed fatty liver with hepatosplenomegaly.  2019 repeat u/s->fatty liver but no cirrhosis or hepatosplenomegaly.   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 Fleeta Rothman)   Iron deficiency anemia    09/2022.  Hemoccults x 3 neg Mar 2024.  Improving with oral iron. GI following, no endoscopies  Lumbosacral neuritis    Lumbosacral spondylosis    Lumbar spinal stenosis with neurogenic claudication--contributes to his chronic pain syndrome   Morbid obesity (HCC)    Normal memory function 08/2014   Neuropsychological testing ([facility]): no cognitive impairment or sign of neurodegenerative disorder.  Likely has adjustment d/o with mixed anxiety/depressed features and may benefit from low dose SNRI.     Normocytic anemia 03/2016   Mild-pt needs  ferritin and vit B12 level checked (as of 03/22/16). Hb stable 09/2019.  Hemoccult x 3 NEG 03/2022.  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 chronic?) Off anticoag for a while due to falls.  Then apixaban  started 12/2014.   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 Clorinda): neuropathic pain->diff to treat, failed nucynta , failed spinal stimul trial, oxycontin  hs + tramadol  + gabap as of 12/2017 f/u Dr. Carilyn.   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   Sick sinus syndrome (HCC)    PPM placed   Thrombocytopenia 2018   HSM on 2007 abd u/s---suspect some mild splenic sequestration chronically.   Unspecified glaucoma(365.9)    Unspecified hereditary and idiopathic peripheral neuropathy approx age 54   bilat LE's, ? left arm, too.  Feet became progressively numb + left foot pain intermittently.  Pt may be trying a spinal stimulator (as of 05/2015)   Vaccine counseling 11/16/2021   VENOUS INSUFFICIENCY    Being  followed by Dr. Harden as of 10/2016 for two R LL venous stasis ulcers/skin tears.  Healed as of 10/30/16 f/u with Dr. Harden.   Venous stasis dermatitis 12/27/2021   VENTRAL HERNIA      Significant Hospital Events: Including procedures, antibiotic start and stop dates in addition to other pertinent events   12/5 admit to [facility]  Interim History / Subjective:  Patient spiked fever with Tmax 100.7 Blood culture is growing ESBL gram-negative rods E. coli/Klebsiella Vasopressor requirement is improving, currently on 13 mics of Levophed  and vasopressin  at 0.03 unit Stated feeling better Foley catheter was changed by urology  Objective    Blood pressure (!) 134/108, pulse (!) 118, temperature 98.3 F (36.8 C), temperature source Oral, resp. rate (!) 21, height 6' 4 (1.93 m), weight 113.4 kg, SpO2 94%. CVP:  [0 mmHg-21 mmHg] 2 mmHg      Intake/Output Summary (Last 24 hours) at 06/07/2024 0915 Last data filed at 06/07/2024 0800 Gross per 24 hour  Intake 5186.05 ml  Output 2295 ml  Net 2891.05 ml   Filed Weights   06/05/24 2350 06/06/24 0751 06/07/24 0500  Weight: 115 kg 115 kg 113.4 kg    Examination: General: Acute on chronically ill-appearing elderly obese male, lying on the bed HEENT: [place]/AT, eyes anicteric.  moist mucus membranes Neuro: Alert, awake following commands Chest: Coarse breath sounds, no wheezes or rhonchi Heart: Irregularly irregular/tachycardic, no murmurs or gallops Abdomen: Soft, nontender, nondistended, bowel sounds present Skin: Multiple bruise marks noted large 1 on bilateral upper extremities  Patient Lines/Drains/Airways Status     Active Line/Drains/Airways     Name Placement date Placement time Site Days   CVC Triple Lumen 06/06/24 Left Femoral 06/06/24  0500  -- 1   Urethral Catheter Ole Bourdon, NP Triple-lumen 24 Fr. 06/06/24  0830  Triple-lumen  1   Wound 06/06/24 0417 Pressure Injury Sacrum Medial Stage 2 -  Partial thickness loss of dermis  presenting as a shallow open injury with a red, pink wound bed without slough. 06/06/24  0417  Sacrum  1   Wound 06/06/24 0423 Other (Comment) Arm Anterior;Left;Lower;Proximal 06/06/24  0423  Arm  1   Wound 06/06/24 0424 Other (Comment) Pretibial Right 06/06/24  0424  Pretibial  1   Wound 06/06/24 0425 Other (Comment) Arm Anterior;Lower;Proximal;Right 06/06/24  0425  Arm  1   Wound 06/06/24 0427 Other (Comment) Pretibial Left 06/06/24  0427  Pretibial  1   Wound 06/06/24 1100 Traumatic Arm Lower;Posterior;Right 06/06/24  1100  Arm  1   Wound 06/06/24 1854 Traumatic Arm Left;Lower;Posterior 06/06/24  1854  Arm  1            Resolved problem list   Assessment and Plan  Severe sepsis with septic shock, POA Acute complicated urinary tract infection in the setting of chronic indwelling Foley catheter, POA ESBL gram-negative bacteremia with E. coli/Klebsiella Leukemoid reaction DIC, improving BPH with chronic indwelling Foley catheter Traumatic Foley catheter exchange leading to gross hematuria, improved Aortic stenosis status post TAVR Enterococcal prosthetic mitral valve endocarditis and pacemaker infection on chronic antibiotic with amoxicillin  Lactic acidosis Shock liver, improving Coagulopathy of liver disease versus DIC Thrombocytopenia critical illness Acute kidney injury due to septic ATN, improving Acute metabolic acidosis Hyponatremia COPD, not in exacerbation Paroxysmal A-fib with RVR Obesity Chronic pain syndrome Diabetes type 2  Continue vasopressor support with MAP goal 65 Vasopressor requirement is improving, currently on 13 mics of Levophed  and vasopressin  White count trended up to 46 likely due to leukemoid reaction Blood cultures positive for E. coli and Klebsiella with ESBL Continue IV meropenem  Zyvox . Monitor CBC Initially labs are consistent with DIC, now improving Foley was exchanged by urology Hematuria has cleared after replacement of Foley Lactate  is trending down LFTs are improving Platelet counts remain low, no signs of active bleeding Serum creatinine started improving Will give 2 A of bicarbonate Monitor electrolytes Remain in A-fib with RVR likely in the setting of acute illness Diet and exercise counseling provided Continue low-dose oxycodone  Continue insulin  with CBG goal 140-180   Labs   CBC: Recent Labs  Lab 06/06/24 0049 06/06/24 0111 06/06/24 0656 06/06/24 0834 06/06/24 0835 06/06/24 1210 06/07/24 0357  WBC 6.7  --  29.9*  --  35.2* 44.1* 46.8*  NEUTROABS 5.8  --   --   --   --   --   --   HGB 12.9*   < > 11.4* 11.9* 11.9* 12.6* 11.3*  HCT 39.1   < > 34.3* 35.0*  35.0* 37.1* 32.6*  MCV 98.5  --  96.9  --  97.5 96.4 95.9  PLT 61*  --  61*  --  66*  65* 71* 45*   < > = values in this interval not displayed.    Basic Metabolic Panel: Recent Labs  Lab 06/06/24 0049 06/06/24 0111 06/06/24 0656 06/06/24 0834 06/06/24 1500 06/07/24 0357  NA 137 137 136 136 135 133*  K 3.8 4.0 3.5 3.7 4.4 4.2  CL 103 100 107  --  105 104  CO2 24  --  21*  --  19* 18*  GLUCOSE 109* 104* 126*  --  188* 180*  BUN 15 18 18   --  21 23  CREATININE 1.47* 1.20 1.42*  --  1.37* 1.22  CALCIUM  8.1*  --  7.6*  --  7.8* 7.8*  MG  --   --  0.9*  --  1.7 1.9   GFR: Estimated Creatinine Clearance: 62.1 mL/min (by C-G formula based on SCr of 1.22 mg/dL). Recent Labs  Lab 06/06/24 0111 06/06/24 0604 06/06/24 0655 06/06/24 0656 06/06/24 0835 06/06/24 1210 06/07/24 0357  WBC  --   --   --  29.9* 35.2* 44.1* 46.8*  LATICACIDVEN 5.0* 2.9* 2.8*  --  2.6*  --   --     Liver Function Tests: Recent Labs  Lab 06/06/24 0049 06/06/24 0656 06/07/24 0357  AST 325* 504* 274*  ALT 80* 121* 110*  ALKPHOS 144* 95 81  BILITOT 1.7* 1.3* 1.1  PROT 5.3* 4.4* 4.9*  ALBUMIN 2.5* 2.1* 2.1*   No results for input(s): LIPASE, AMYLASE in the last 168 hours. Recent Labs  Lab 06/06/24 0835  AMMONIA 37*    ABG    Component Value  Date/Time   PHART 7.383 06/06/2024 0834   PCO2ART 31.8 (L) 06/06/2024 0834   PO2ART 94 06/06/2024 0834   HCO3 19.0 (L) 06/06/2024 0834   TCO2 20 (L) 06/06/2024 0834   ACIDBASEDEF 5.0 (H) 06/06/2024 0834   O2SAT 97 06/06/2024 0834     Coagulation Profile: Recent Labs  Lab 06/06/24 0049 06/06/24 0835 06/06/24 1210 06/07/24 0357  INR 3.7* 2.7* 2.3* 2.3*    Cardiac Enzymes: No results for input(s): CKTOTAL, CKMB, CKMBINDEX, TROPONINI in the last 168 hours.  HbA1C: Hemoglobin A1C  Date/Time Value Ref Range Status  03/06/2024 02:50 PM 5.8 (A) 4.0 - 5.6 % Final  06/08/2023 03:20 PM 5.5 4.0 - 5.6 % Final   HbA1c, POC (prediabetic range)  Date/Time Value Ref Range Status  03/06/2024 02:50 PM 5.8 5.7 - 6.4 % Final  06/08/2023 03:20 PM 5.5 (A) 5.7 - 6.4 % Final   HbA1c, POC (controlled diabetic range)  Date/Time Value Ref Range Status  03/06/2024 02:50 PM 5.8 0.0 - 7.0 % Final  06/08/2023 03:20 PM 5.5 0.0 - 7.0 % Final   HbA1c POC (<> result, manual entry)  Date/Time Value Ref Range Status  03/06/2024 02:50 PM 5.8 4.0 - 5.6 % Final  06/08/2023 03:20 PM 5.5 4.0 - 5.6 % Final   Hgb A1c MFr Bld  Date/Time Value Ref Range Status  06/06/2024 06:57 AM 4.5 (L) 4.8 - 5.6 % Final    Comment:    (NOTE) Diagnosis of Diabetes The following HbA1c ranges recommended by the American Diabetes Association (ADA) may be used as an aid in the diagnosis of diabetes mellitus.  Hemoglobin             Suggested A1C NGSP%  Diagnosis  <5.7                   Non Diabetic  5.7-6.4                Pre-Diabetic  >6.4                   Diabetic  <7.0                   Glycemic control for                       adults with diabetes.      CBG: Recent Labs  Lab 06/06/24 1209 06/06/24 1659 06/06/24 1945 06/06/24 2339 06/07/24 0400  GLUCAP 185* 187* 129* 172* 188*    The patient is critically ill due to severe sepsis with septic shock due to gram-negative  bacteremia.  Critical care was necessary to treat or prevent imminent or life-threatening deterioration.  Critical care was time spent personally by me on the following activities: development of treatment plan with patient and/or surrogate as well as nursing, discussions with consultants, evaluation of patient's response to treatment, examination of patient, obtaining history from patient or surrogate, ordering and performing treatments and interventions, ordering and review of laboratory studies, ordering and review of radiographic studies, pulse oximetry, re-evaluation of patient's condition and participation in multidisciplinary rounds.   During this encounter critical care time was devoted to patient care services described in this note for 44 minutes.     Valinda Novas, MD [facility] See Amion for pager If no response to pager, please call [phone] until 7pm After 7pm, Please call E-link [phone]

## 2024-06-08 LAB — COMPREHENSIVE METABOLIC PANEL WITH GFR
ALT: 125 U/L — ABNORMAL HIGH (ref 0–44)
AST: 242 U/L — ABNORMAL HIGH (ref 15–41)
Albumin: 2.1 g/dL — ABNORMAL LOW (ref 3.5–5.0)
Alkaline Phosphatase: 103 U/L (ref 38–126)
Anion gap: 8 (ref 5–15)
BUN: 27 mg/dL — ABNORMAL HIGH (ref 8–23)
CO2: 24 mmol/L (ref 22–32)
Calcium: 8 mg/dL — ABNORMAL LOW (ref 8.9–10.3)
Chloride: 106 mmol/L (ref 98–111)
Creatinine, Ser: 0.79 mg/dL (ref 0.61–1.24)
GFR, Estimated: >60 mL/min (ref >60)
Glucose, Bld: 156 mg/dL — ABNORMAL HIGH (ref 70–99)
Potassium: 3.8 mmol/L (ref 3.5–5.1)
Sodium: 138 mmol/L (ref 135–145)
Total Bilirubin: 1.5 mg/dL — ABNORMAL HIGH (ref 0.0–1.2)
Total Protein: 5.1 g/dL — ABNORMAL LOW (ref 6.5–8.1)

## 2024-06-08 LAB — MAGNESIUM
Magnesium: 0.9 mg/dL — CL (ref 1.7–2.4)
Magnesium: 2.3 mg/dL (ref 1.7–2.4)

## 2024-06-08 LAB — CBC
HCT: 30.4 % — ABNORMAL LOW (ref 39.0–52.0)
Hemoglobin: 10.5 g/dL — ABNORMAL LOW (ref 13.0–17.0)
MCH: 32.9 pg (ref 26.0–34.0)
MCHC: 34.5 g/dL (ref 30.0–36.0)
MCV: 95.3 fL (ref 80.0–100.0)
Platelets: 43 K/uL — ABNORMAL LOW (ref 150–400)
RBC: 3.19 MIL/uL — ABNORMAL LOW (ref 4.22–5.81)
RDW: 15.4 % (ref 11.5–15.5)
WBC: 56.7 K/uL (ref 4.0–10.5)
nRBC: 0 % (ref 0.0–0.2)

## 2024-06-08 LAB — URINE CULTURE: Culture: >=100000 — AB

## 2024-06-08 LAB — CULTURE, BLOOD (ROUTINE X 2)
Special Requests: ADEQUATE
Special Requests: ADEQUATE

## 2024-06-08 LAB — DIC (DISSEMINATED INTRAVASCULAR COAGULATION)PANEL
D-Dimer, Quant: 6.31 ug{FEU}/mL — ABNORMAL HIGH (ref 0.00–0.50)
Fibrinogen: 491 mg/dL — ABNORMAL HIGH (ref 210–475)
INR: 1.2 (ref 0.8–1.2)
Platelets: 39 K/uL — ABNORMAL LOW (ref 150–400)
Prothrombin Time: 15.6 s — ABNORMAL HIGH (ref 11.4–15.2)
Smear Review: NONE SEEN
aPTT: 33 s (ref 24–36)

## 2024-06-08 LAB — GLUCOSE, CAPILLARY
Glucose-Capillary: 131 mg/dL — ABNORMAL HIGH (ref 70–99)
Glucose-Capillary: 149 mg/dL — ABNORMAL HIGH (ref 70–99)
Glucose-Capillary: 151 mg/dL — ABNORMAL HIGH (ref 70–99)
Glucose-Capillary: 160 mg/dL — ABNORMAL HIGH (ref 70–99)
Glucose-Capillary: 179 mg/dL — ABNORMAL HIGH (ref 70–99)
Glucose-Capillary: 92 mg/dL (ref 70–99)

## 2024-06-08 LAB — BASIC METABOLIC PANEL WITH GFR
Anion gap: 9 (ref 5–15)
BUN: 28 mg/dL — ABNORMAL HIGH (ref 8–23)
CO2: 23 mmol/L (ref 22–32)
Calcium: 7.9 mg/dL — ABNORMAL LOW (ref 8.9–10.3)
Chloride: 104 mmol/L (ref 98–111)
Creatinine, Ser: 0.64 mg/dL (ref 0.61–1.24)
GFR, Estimated: >60 mL/min (ref >60)
Glucose, Bld: 136 mg/dL — ABNORMAL HIGH (ref 70–99)
Potassium: 3.9 mmol/L (ref 3.5–5.1)
Sodium: 136 mmol/L (ref 135–145)

## 2024-06-08 MED ORDER — HYDROCORTISONE SOD SUC (PF) 100 MG IJ SOLR
100.0000 mg | INTRAMUSCULAR | Status: AC
  Administered 2024-06-09: 100 mg via INTRAVENOUS
  Filled 2024-06-08: qty 2

## 2024-06-08 MED ORDER — GABAPENTIN 100 MG PO CAPS
100.0000 mg | ORAL_CAPSULE | Freq: Three times a day (TID) | ORAL | Status: DC
  Administered 2024-06-08 – 2024-06-25 (×52): 100 mg via ORAL
  Filled 2024-06-08 (×52): qty 1

## 2024-06-08 MED ORDER — POTASSIUM CHLORIDE 20 MEQ PO PACK
40.0000 meq | PACK | Freq: Once | ORAL | Status: AC
  Administered 2024-06-08: 40 meq via ORAL
  Filled 2024-06-08: qty 2

## 2024-06-08 NOTE — Progress Notes (Signed)
 NAME:  Tim [name], MRN:  [id number], DOB:  14-Oct-1940, LOS: 2 ADMISSION DATE:  06/05/2024, CONSULTATION DATE:  12/5 REFERRING MD:  Dr. 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  Patient has history of enterococcal infection of the AICD lead that is being treated with chronic amoxicillin .  Patient recently admitted to [facility] on 10/9-10/17 with mixed hypovolemic/septic shock.  Patient been admitted to [name] on 10/29-11/1 with [name] treated w/ vanc. Wife states he has not had diarrhea symptoms and off po vanc. Did have diarrhea on 12/3 and so did wife. Wife also states he has poor po intake. On 12/4 overnight patient had high fever. EMS called found temp of 103.7. States currently on po vanc for c diff. On arrival to [facility] ED temp 99.3 F and wbc 6.7. LA 5. Chronic foley in place w/ hematuria which was replaced. UA inconclusive. INR 3.7. CXR unremarkable. Cultures obtained and started on broad spectrum abx. Despite fluids remains hypotensive started on levo. PCCM consulted.   Pertinent  Medical History   Past Medical History:  Diagnosis Date   AICD (automatic cardioverter/defibrillator) present    Balanitis    +severe phimosis+ buried penis->circ not possible but dorsal slit done 10/2019   BPH (benign prostatic hypertrophy)    with urinary retention.  Renal u/s 12/04/19 NORMAL KIDNEYS, NORMAL BLADDER, NO HYDRONEPHROSIS   CAD (coronary artery disease)    Nonobstructive by 12/2012 cath; then 03/2016 he required BMS to RCA ([facility]).  In-stent restenosis on cath 11/01/16, baloon angioplasty successful.   CATARACT, HX OF    Chronic combined systolic and diastolic CHF (congestive heart failure)  (HCC) 05/2016   Ischemic CM.  04/2018 EF 40-45%, grd I DD.  07/2021 EF 55-60%, AV well seated   Chronic pain syndrome    Lumbar DDD; chronic neuropathic pain (DM); has spinal stimulator and sees pain mgmt MD   Complicated UTI (urinary tract infection) 09/2019   Phimoses, acute urinary retention, entoerococcus UTI, enterococc bacteremia.  F/u blood clx's neg x 5d.     COPD    Debilitated patient    Diabetes mellitus type 2 with complications (HCC)    HbA1c jumped from 5.7% to 6.1% 03/2015---started metformin  at that time.  DM 2 dx by fasting gluc criteria 2018.  Has chronic neuropath pain   Enterococcal bacteremia 09/2019   Phimoses, acute urinary retention, entoerococcus UTI, enterococc bacteremia.  F/u blood clx's neg x 5d.     Essential hypertension, benign    Fatty liver 2007   2007 u/s showed fatty liver with hepatosplenomegaly.  2019 repeat u/s->fatty liver but no cirrhosis or hepatosplenomegaly.   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 Fleeta Rothman)   Iron deficiency anemia    09/2022.  Hemoccults x 3 neg Mar 2024.  Improving with oral iron. GI following, no endoscopies  Lumbosacral neuritis    Lumbosacral spondylosis    Lumbar spinal stenosis with neurogenic claudication--contributes to his chronic pain syndrome   Morbid obesity (HCC)    Normal memory function 08/2014   Neuropsychological testing ([facility]): no cognitive impairment or sign of neurodegenerative disorder.  Likely has adjustment d/o with mixed anxiety/depressed features and may benefit from low dose SNRI.     Normocytic anemia 03/2016   Mild-pt needs  ferritin and vit B12 level checked (as of 03/22/16). Hb stable 09/2019.  Hemoccult x 3 NEG 03/2022.  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 chronic?) Off anticoag for a while due to falls.  Then apixaban  started 12/2014.   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 Clorinda): neuropathic pain->diff to treat, failed nucynta , failed spinal stimul trial, oxycontin  hs + tramadol  + gabap as of 12/2017 f/u Dr. Carilyn.   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   Sick sinus syndrome (HCC)    PPM placed   Thrombocytopenia 2018   HSM on 2007 abd u/s---suspect some mild splenic sequestration chronically.   Unspecified glaucoma(365.9)    Unspecified hereditary and idiopathic peripheral neuropathy approx age 5   bilat LE's, ? left arm, too.  Feet became progressively numb + left foot pain intermittently.  Pt may be trying a spinal stimulator (as of 05/2015)   Vaccine counseling 11/16/2021   VENOUS INSUFFICIENCY    Being  followed by Dr. Harden as of 10/2016 for two R LL venous stasis ulcers/skin tears.  Healed as of 10/30/16 f/u with Dr. Harden.   Venous stasis dermatitis 12/27/2021   VENTRAL HERNIA      Significant Hospital Events: Including procedures, antibiotic start and stop dates in addition to other pertinent events   12/5 admit to [facility] 12/6 spiked fever with Tmax of 100.7, blood culture is growing ESBL gram-negative rods with E. coli/Klebsiella.  Vasopressor requirement improving, Levophed  at 13, vasopressin  at 0.03 unit.  Foley catheter was exchanged by urology, no more hematuria  Interim History / Subjective:  Patient became afebrile, no white count trended up to 56K Vasopressor requirement continue to improve, currently on 9 mics of Levophed  and vasopressin  at 0.03 Stated feeling better, appetite is improving  Objective    Blood pressure (!) 116/50, pulse (!) 104, temperature (!) 97 F (36.1 C), temperature source Axillary, resp. rate 12, height 6' 4 (1.93 m), weight 118 kg, SpO2 100%. CVP:  [0 mmHg-42  mmHg] 42 mmHg      Intake/Output Summary (Last 24 hours) at 06/08/2024 1009 Last data filed at 06/08/2024 0900 Gross per 24 hour  Intake 825.41 ml  Output 902 ml  Net -76.59 ml   Filed Weights   06/06/24 0751 06/07/24 0500 06/08/24 0436  Weight: 115 kg 113.4 kg 118 kg    Examination: General: Acute on chronically ill-appearing elderly obese male, lying on the bed HEENT: [place]/AT, eyes anicteric.  moist mucus membranes Neuro: Alert, awake following commands, moving all 4 extremities Chest: Coarse breath sounds, no wheezes or rhonchi Heart: Irregularly irregular, tachycardic Abdomen: Soft, nontender, nondistended, bowel sounds present Skin: Multiple bruise marks noted on bilateral upper extremities  Labs reviewed  Patient Lines/Drains/Airways Status     Active Line/Drains/Airways     Name Placement date Placement time Site Days   Arterial Line 06/06/24 Left Femoral 06/06/24  0539   Femoral  2   CVC Triple Lumen 06/06/24 Left Femoral 06/06/24  0500  -- 2   Urethral Catheter Ole Bourdon, NP Triple-lumen 24 Fr. 06/06/24  0830  Triple-lumen  2   Wound 06/06/24 0417 Pressure Injury Sacrum Medial Stage 2 -  Partial thickness loss of dermis presenting as a shallow open injury with a red, pink wound bed without slough. 06/06/24  0417  Sacrum  2   Wound 06/06/24 0423 Other (Comment) Arm Anterior;Left;Lower;Proximal 06/06/24  0423  Arm  2   Wound 06/06/24 0424 Other (Comment) Pretibial Right 06/06/24  0424  Pretibial  2   Wound 06/06/24 0425 Other (Comment) Arm Anterior;Lower;Proximal;Right 06/06/24  0425  Arm  2   Wound 06/06/24 0427 Other (Comment) Pretibial Left 06/06/24  0427  Pretibial  2   Wound 06/06/24 1100 Traumatic Arm Lower;Posterior;Right 06/06/24  1100  Arm  2   Wound 06/06/24 1854 Traumatic Arm Left;Lower;Posterior 06/06/24  1854  Arm  2            Resolved problem list   Assessment and Plan  Gram-negative severe sepsis with septic shock, POA Acute complicated urinary tract infection in the setting of chronic indwelling Foley catheter, POA ESBL E. 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 on chronic antibiotic with amoxicillin  Acute metabolic acidosis/ Lactic acidosis, improving Shock liver, improving Coagulopathy in the setting of shock liver versus DIC Thrombocytopenia critical illness, slowly declining Acute kidney injury due to septic ATN, resolved Hyponatremia, resolved COPD, not in exacerbation Paroxysmal A-fib with RVR Obesity Chronic pain syndrome Diabetes type 2  Vasopressor requirement is improving, currently on 9 mics of Levophed  and vasopressin  at 0.03 Will stop vasopressin   and continue Levophed  to maintain MAP goal 65 White count continue to trend up, currently at 56K due to leukemoid reaction Decrease hydrocortisone  to 100 mg daily Foley catheter was replaced by urology Blood culture sensitivities are back both organisms are sensitive to meropenem  Repeat DIC panel Hematuria has resolved LFT continue to improve Platelet count remained in 40s, H&H is stable, watch for signs of bleeding Serum creatinine is back to baseline at 0.7 now Serum sodium has improved Remain in A-fib with RVR Diet and exercise counseling provided Restarted back on low-dose gabapentin  at 100 mg 3 times daily Continue low-dose oxycodone  Continue insulin  with CBG goal 140-180   Labs   CBC: Recent Labs  Lab 06/06/24 0049 06/06/24 0111 06/06/24 0656  06/06/24 0834 06/06/24 0835 06/06/24 1210 06/07/24 0357 06/08/24 0435  WBC 6.7  --  29.9*  --  35.2* 44.1* 46.8* 56.7*  NEUTROABS 5.8  --   --   --   --   --   --   --   HGB 12.9*   < > 11.4* 11.9* 11.9* 12.6* 11.3* 10.5*  HCT 39.1   < > 34.3* 35.0* 35.0* 37.1* 32.6* 30.4*  MCV 98.5  --  96.9  --  97.5 96.4 95.9 95.3  PLT 61*  --  61*  --  66*  65* 71* 45* 43*   < > = values in this interval not displayed.    Basic Metabolic Panel: Recent Labs  Lab 06/06/24 0049 06/06/24 0111 06/06/24 0656 06/06/24 0834 06/06/24 1500 06/07/24 0357 06/08/24 0435  NA 137 137 136 136 135 133* 138  K 3.8 4.0 3.5 3.7 4.4 4.2 3.8  CL 103 100 107  --  105 104 106  CO2 24  --  21*  --  19* 18* 24  GLUCOSE 109* 104* 126*  --  188* 180* 156*  BUN 15 18 18   --  21 23 27*  CREATININE 1.47* 1.20 1.42*  --  1.37* 1.22 0.79  CALCIUM  8.1*  --  7.6*  --  7.8* 7.8* 8.0*  MG  --   --  0.9*  --  1.7 1.9 2.3   GFR: Estimated Creatinine Clearance: 96.5 mL/min (by C-G formula based on SCr of 0.79 mg/dL). Recent Labs  Lab 06/06/24 0111 06/06/24 0604 06/06/24 0655 06/06/24 0656 06/06/24 0835 06/06/24 1210 06/07/24 0357 06/08/24 0435  WBC  --    --   --    < > 35.2* 44.1* 46.8* 56.7*  LATICACIDVEN 5.0* 2.9* 2.8*  --  2.6*  --   --   --    < > = values in this interval not displayed.    Liver Function Tests: Recent Labs  Lab 06/06/24 0049 06/06/24 0656 06/07/24 0357 06/08/24 0435  AST 325* 504* 274* 242*  ALT 80* 121* 110* 125*  ALKPHOS 144* 95 81 103  BILITOT 1.7* 1.3* 1.1 1.5*  PROT 5.3* 4.4* 4.9* 5.1*  ALBUMIN 2.5* 2.1* 2.1* 2.1*   No results for input(s): LIPASE, AMYLASE in the last 168 hours. Recent Labs  Lab 06/06/24 0835  AMMONIA 37*    ABG    Component Value Date/Time   PHART 7.383 06/06/2024 0834   PCO2ART 31.8 (L) 06/06/2024 0834   PO2ART 94 06/06/2024 0834   HCO3 19.0 (L) 06/06/2024 0834   TCO2 20 (L) 06/06/2024 0834   ACIDBASEDEF 5.0 (H) 06/06/2024 0834   O2SAT 97 06/06/2024 0834     Coagulation Profile: Recent Labs  Lab 06/06/24 0049 06/06/24 0835 06/06/24 1210 06/07/24 0357  INR 3.7* 2.7* 2.3* 2.3*    Cardiac Enzymes: No results for input(s): CKTOTAL, CKMB, CKMBINDEX, TROPONINI in the last 168 hours.  HbA1C: Hemoglobin A1C  Date/Time Value Ref Range Status  03/06/2024 02:50 PM 5.8 (A) 4.0 - 5.6 % Final  06/08/2023 03:20 PM 5.5 4.0 - 5.6 % Final   HbA1c, POC (prediabetic range)  Date/Time Value Ref Range Status  03/06/2024 02:50 PM 5.8 5.7 - 6.4 % Final  06/08/2023 03:20 PM 5.5 (A) 5.7 - 6.4 % Final   HbA1c, POC (controlled diabetic range)  Date/Time Value Ref Range Status  03/06/2024 02:50 PM 5.8 0.0 - 7.0 % Final  06/08/2023 03:20 PM 5.5 0.0 - 7.0 % Final  HbA1c POC (<> result, manual entry)  Date/Time Value Ref Range Status  03/06/2024 02:50 PM 5.8 4.0 - 5.6 % Final  06/08/2023 03:20 PM 5.5 4.0 - 5.6 % Final   Hgb A1c MFr Bld  Date/Time Value Ref Range Status  06/06/2024 06:57 AM 4.5 (L) 4.8 - 5.6 % Final    Comment:    (NOTE) Diagnosis of Diabetes The following HbA1c ranges recommended by the American Diabetes Association (ADA) may be used as an aid  in the diagnosis of diabetes mellitus.  Hemoglobin             Suggested A1C NGSP%              Diagnosis  <5.7                   Non Diabetic  5.7-6.4                Pre-Diabetic  >6.4                   Diabetic  <7.0                   Glycemic control for                       adults with diabetes.      CBG: Recent Labs  Lab 06/07/24 1519 06/07/24 1948 06/07/24 2328 06/08/24 0431 06/08/24 0706  GLUCAP 164* 191* 192* 160* 92    The patient is critically ill due to severe sepsis with septic shock due to gram-negative bacteremia.  Critical care was necessary to treat or prevent imminent or life-threatening deterioration.  Critical care was time spent personally by me on the following activities: development of treatment plan with patient and/or surrogate as well as nursing, discussions with consultants, evaluation of patient's response to treatment, examination of patient, obtaining history from patient or surrogate, ordering and performing treatments and interventions, ordering and review of laboratory studies, ordering and review of radiographic studies, pulse oximetry, re-evaluation of patient's condition and participation in multidisciplinary rounds.   During this encounter critical care time was devoted to patient care services described in this note for 38 minutes.     Valinda Novas, MD [facility] See Amion for pager If no response to pager, please call [phone] until 7pm After 7pm, Please call E-link [phone]

## 2024-06-08 NOTE — Progress Notes (Signed)
 Palliative Medicine Inpatient Follow Up Note   HPI:  83 y.o. 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    Patient has history of enterococcal infection of the AICD lead that is being treated with chronic amoxicillin .  Patient recently admitted at [facility] from 10/9-10/17 with mixed hypovolemic/septic shock.  Also noted patient has been admitted to [facility] from 10/29-11/1 with [name] and was treated with vancomycin .  Wife states he has poor oral intake.  On 06/05/2024 overnight patient had high fever.  EMS called found temperature of 103.7.  Patient has chronic Foley in place with hematuria, Foley replaced.  Despite IV fluids remains hypotensive, therefore started on levo drip.  Transferred to ICU services for intensive care management.   Worth to note that patient has had 2 inpatient admissions in the last 6 months.  Hospital stay day 2.   PMT has been consulted to assist with goals of care conversation. Patient/Family face treatment option decisions, advanced directive decisions and anticipatory care needs.    Family face treatment option decision, advance directive decisions and anticipatory care needs.   Today's Discussion 06/08/2024   I have reviewed medical records including: EPIC notes: Reviewed critical care progress note from 06/07/2024, patient currently being treated mainly for severe sepsis with septic shock due to gram-negative bacteria.  Initial labs were consistent with DIC, however now improving.  Reviewed progress note from urology 06/07/2024, noted for no more hematuria or clots, patient is with chronic Foley, urine now clear therefore CBI is  turned off, continue Foley to gravity. Vital signs: Reviewed from 06/08/2024 at 8 AM: Temperature 97, heart rate 118, respiration 17, 94% on room air, blood pressure 149/62. MAR: Reviewed medicines the patient received over the last 24 hours.  Patient receiving IV antibiotics for the sepsis.  On Levophed  drip 11 mcg/min, and vasopressin  at 0.03 units/min.  Patient also received as needed oxycodone  yesterday at 1018, 1609, and today at 0219 to address pain. Available advanced directives in ACP: Patient has pre-existing advance directives uploaded on file. Labs: Complete blood count reviewed from 06/08/2024, noted for significant elevation in WBC at 56.7, increased from 46.8 yesterday.  Hemoglobin stable at 10.5.  This is being reviewed for prognostication.  Met with patient and his wife to discuss diagnosis prognosis, GOC, EOL wishes, disposition and options.  I visited the patient at bedside today, with his wife present.  He was lying comfortably in bed in a Semi-Fowler's position, breathing spontaneously on room air.  Although chronically ill-appearing, he was not in acute distress.  He was able to communicate his needs and denied any pain or discomfort.  He engaged in meaningful conversation throughout the visit.  The patient reported feeling better today compared to previous days.  His appetite remains poor but is gradually improving.  He mentioned that he did not sleep well last night but is hopeful for better rest tonight.  His appetite is also improving.  We discussed his current medical condition and ongoing treatment plan.  I explained that we are continuing to address his acute issues primarily septic shock, with intravenous antibiotics.  I also shared that he is still receiving continuous pressors to support his hemodynamic status.  He expressed hope for continued clinical  improvement and shared his desire to return home with his wife.  I emphasized that this would likely be an uphill battle with  potential setbacks due to his chronic comorbidities.  Also shared that he remains high risk for decompensation, to which he acknowledged.  The patient identified his main challenge as limited mobility and overall functional decline.  He described feeling weak and struggling to move around at home even prior to hospital stay.  His wife added that being able to transfer from bed to the bedside commode and back with make a significant difference.  Both shared that prior to hospitalization, he was receiving physical and Occupational Therapy weekly through [facility], along with weekly nursing visits to assist with activities of daily living and Foley care.  Educated and discussed the importance of being able to recognize signs of sudden worsening of health and to have a plan in place for urgent care needs.   I verbalized my concern for patient's high risk for decompensation and the importance of clarification regarding overall plan of care as it relates to symptom management, rehospitalization and ensuring decisions are within the context of the patient's values and goals of care.   Overall, goals of care remain unchanged.  Continue treating reversible conditions, allow time for outcomes, and maintain hope for clinical improvement.  We also explored potential discharge options should his condition improved.  He expressed strong preference to return home with home health services including PT and OT, rather than going to skilled nursing facility, citing a previous negative experience.  He reiterated that he does desired to pursue outpatient palliative care services at discharge.  I agree with this plan is reasonable and we will initiate a transitional care consult to help facilitate this arrangement.  Created space and opportunity for patient to explore thoughts feelings and fears regarding current medical situation.  Questions and concerns addressed.  Palliative Support Provided.   Coordinated  care with bedside RN who reports no significant change in status overall. Patient is still on pressors. Had some delirium episodes last night but mentation is back to baseline this morning.   Objective Assessment: Vital Signs Vitals:   06/08/24 0844 06/08/24 0845  BP:    Pulse:    Resp:    Temp:    SpO2: 100% 100%    Intake/Output Summary (Last 24 hours) at 06/08/2024 0912 Last data filed at 06/08/2024 0800 Gross per 24 hour  Intake 728.48 ml  Output 1097 ml  Net -368.52 ml   Last Weight  Most recent update: 06/08/2024  4:36 AM    Weight  118 kg (260 lb 2.3 oz)             Gen: Chronically ill-appearing HEENT: Mucous membranes are moist CV: Tachycardia, heart rate 105 PULM: Clear, but diminished bilaterally ABD: Abdominal obesity noted EXT: Edema to all 4 extremities Neuro: Alert and oriented x 3  SUMMARY OF RECOMMENDATIONS    Code Status: Maintain DNR/DNI status Continue with current medical interventions, allow time for outcomes, and hopes for clinical improvement. TOC consult placed to help assist with setting up outpatient palliative services. Continue to provide psycho-social and emotional support to patient and family Palliative medicine team will continue to follow.   Symptom Management: Per Primary team  I personally spent a total of 50 minutes in the care of the patient today including preparing to see the patient, getting/reviewing separately obtained history, performing a medically appropriate exam/evaluation, counseling and educating, referring and communicating with other health  care professionals, documenting clinical information in the EHR, independently interpreting results, communicating results, and coordinating care.    ______________________________________________________________________________________ Kathlyne Bolder NP-C [place] [facility] Team Team Cell Phone: [phone] Please utilize secure chat with additional questions, if  there is no response within 30 minutes please call the above phone number  Palliative Medicine Team providers are available by phone from 7am to 7pm daily and can be reached through the team cell phone.  Should this patient require assistance outside of these hours, please call the patient's attending physician.

## 2024-06-09 ENCOUNTER — Other Ambulatory Visit (HOSPITAL_COMMUNITY): Payer: Self-pay

## 2024-06-09 ENCOUNTER — Telehealth (HOSPITAL_COMMUNITY): Payer: Self-pay

## 2024-06-09 LAB — COMPREHENSIVE METABOLIC PANEL WITH GFR
ALT: 238 U/L — ABNORMAL HIGH (ref 0–44)
AST: 523 U/L — ABNORMAL HIGH (ref 15–41)
Albumin: 1.9 g/dL — ABNORMAL LOW (ref 3.5–5.0)
Alkaline Phosphatase: 198 U/L — ABNORMAL HIGH (ref 38–126)
Anion gap: 9 (ref 5–15)
BUN: 25 mg/dL — ABNORMAL HIGH (ref 8–23)
CO2: 26 mmol/L (ref 22–32)
Calcium: 8.1 mg/dL — ABNORMAL LOW (ref 8.9–10.3)
Chloride: 104 mmol/L (ref 98–111)
Creatinine, Ser: 0.81 mg/dL (ref 0.61–1.24)
GFR, Estimated: >60 mL/min (ref >60)
Glucose, Bld: 111 mg/dL — ABNORMAL HIGH (ref 70–99)
Potassium: 3.7 mmol/L (ref 3.5–5.1)
Sodium: 139 mmol/L (ref 135–145)
Total Bilirubin: 1.5 mg/dL — ABNORMAL HIGH (ref 0.0–1.2)
Total Protein: 4.8 g/dL — ABNORMAL LOW (ref 6.5–8.1)

## 2024-06-09 LAB — CBC
HCT: 29 % — ABNORMAL LOW (ref 39.0–52.0)
Hemoglobin: 9.7 g/dL — ABNORMAL LOW (ref 13.0–17.0)
MCH: 32.2 pg (ref 26.0–34.0)
MCHC: 33.4 g/dL (ref 30.0–36.0)
MCV: 96.3 fL (ref 80.0–100.0)
Platelets: 39 K/uL — ABNORMAL LOW (ref 150–400)
RBC: 3.01 MIL/uL — ABNORMAL LOW (ref 4.22–5.81)
RDW: 15.3 % (ref 11.5–15.5)
WBC: 46.1 K/uL — ABNORMAL HIGH (ref 4.0–10.5)
nRBC: 0 % (ref 0.0–0.2)

## 2024-06-09 LAB — GLUCOSE, CAPILLARY
Glucose-Capillary: 127 mg/dL — ABNORMAL HIGH (ref 70–99)
Glucose-Capillary: 101 mg/dL — ABNORMAL HIGH (ref 70–99)
Glucose-Capillary: 133 mg/dL — ABNORMAL HIGH (ref 70–99)
Glucose-Capillary: 158 mg/dL — ABNORMAL HIGH (ref 70–99)
Glucose-Capillary: 153 mg/dL — ABNORMAL HIGH (ref 70–99)

## 2024-06-09 LAB — MAGNESIUM: Magnesium: 2.1 mg/dL (ref 1.7–2.4)

## 2024-06-09 MED ORDER — POTASSIUM CHLORIDE CRYS ER 20 MEQ PO TBCR
30.0000 meq | EXTENDED_RELEASE_TABLET | Freq: Once | ORAL | Status: AC
  Administered 2024-06-09: 30 meq via ORAL
  Filled 2024-06-09: qty 1

## 2024-06-09 MED ORDER — COLLAGENASE 250 UNIT/GM EX OINT
1.0000 | TOPICAL_OINTMENT | Freq: Every day | CUTANEOUS | Status: DC
  Administered 2024-06-09 – 2024-06-25 (×17): 1 via TOPICAL
  Filled 2024-06-09: qty 30

## 2024-06-09 MED ORDER — FUROSEMIDE 10 MG/ML IJ SOLN
40.0000 mg | Freq: Once | INTRAMUSCULAR | Status: AC
  Administered 2024-06-09: 40 mg via INTRAVENOUS
  Filled 2024-06-09: qty 4

## 2024-06-09 MED ORDER — AMOXICILLIN 500 MG PO CAPS
1000.0000 mg | ORAL_CAPSULE | Freq: Two times a day (BID) | ORAL | Status: DC
  Administered 2024-06-16 – 2024-06-19 (×7): 1000 mg via ORAL
  Filled 2024-06-09 (×8): qty 2

## 2024-06-09 MED ORDER — SODIUM CHLORIDE 0.9 % IV SOLN
1.0000 g | Freq: Three times a day (TID) | INTRAVENOUS | Status: AC
  Administered 2024-06-09 – 2024-06-15 (×19): 1 g via INTRAVENOUS
  Filled 2024-06-09 (×21): qty 20

## 2024-06-09 MED ORDER — VANCOMYCIN HCL 125 MG PO CAPS
125.0000 mg | ORAL_CAPSULE | Freq: Every day | ORAL | Status: AC
  Administered 2024-06-10 – 2024-06-17 (×8): 125 mg via ORAL
  Filled 2024-06-09 (×10): qty 1

## 2024-06-09 MED ORDER — INSULIN ASPART 100 UNIT/ML IJ SOLN
0.0000 [IU] | Freq: Three times a day (TID) | INTRAMUSCULAR | Status: DC
  Administered 2024-06-09 – 2024-06-14 (×5): 1 [IU] via SUBCUTANEOUS
  Filled 2024-06-09 (×4): qty 1

## 2024-06-09 NOTE — Consult Note (Addendum)
 WOC Nurse Consult Note: Reason for Consult: requested to assess multiple wounds.  Wound type: bruises and partial thickness in both arms.  Weeping serous drainage. Wound bed: 100% red, two open wound less than 1 cm x 0.2 cm , 100% red. Drainage (amount, consistency, odor) Copious serous amount due to edema. Periwound: edema, hematoma. Dressing procedure/placement/frequency: Cleanse with saline, pat dry. Apply ABD pad to the open areas, wrap with Kerlix and hold tape. Change PRN.  Wound type: PI Stage 3 Pressure Injury POA: Yes Measurement: 1 cm x 0.4 cm Wound bed: 100% yellow. Drainage (amount, consistency, odor) Scant amount. Periwound: redness, bleachable areas, not considered a PI. Dressing procedure/placement/frequency: Cleanse with saline, pat dry. Apply Medihoney daily to the wound bed, top with foam dressing. The foam can stay up to 3 days if not saturated or soiled. Ok to lift and reapply the Medihoney gel.  WOC team will not plan to follow further. Please reconsult if further assistance is needed. Thank-you,  Lela Holm MSN, RN, CWCN, CNS.  (Phone [phone])

## 2024-06-09 NOTE — Progress Notes (Addendum)
 NAME:  Tim [name], MRN:  [id number], DOB:  [birth year], LOS: 3 ADMISSION DATE:  06/05/2024, CONSULTATION DATE:  12/5 REFERRING MD:  Dr. 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  Patient has history of enterococcal infection of the AICD lead that is being treated with chronic amoxicillin .  Patient recently admitted to [facility] on 10/9-10/17 with mixed hypovolemic/septic shock.  Patient been admitted to [facility] on 10/29-11/1 with [name] treated w/ vanc. Wife states he has not had diarrhea symptoms and off po vanc. Did have diarrhea on 12/3 and so did wife. Wife also states he has poor po intake. On 12/4 overnight patient had high fever. EMS called found temp of 103.7. States currently on po vanc for c diff. On arrival to [name] ED temp 99.3 F and wbc 6.7. LA 5. Chronic foley in place w/ hematuria which was replaced. UA inconclusive. INR 3.7. CXR unremarkable. Cultures obtained and started on broad spectrum abx. Despite fluids remains hypotensive started on levo. PCCM consulted.   Pertinent  Medical History   Past Medical History:  Diagnosis Date   AICD (automatic cardioverter/defibrillator) present    Balanitis    +severe phimosis+ buried penis->circ not possible but dorsal slit done 10/2019   BPH (benign prostatic hypertrophy)    with urinary retention.  Renal u/s 12/04/19 NORMAL KIDNEYS, NORMAL BLADDER, NO HYDRONEPHROSIS   CAD (coronary artery disease)    Nonobstructive by 12/2012 cath; then 03/2016 he required BMS to RCA ([facility]).  In-stent restenosis on cath 11/01/16, baloon angioplasty successful.   CATARACT, HX OF    Chronic combined systolic and diastolic CHF (congestive heart failure)  (HCC) 05/2016   Ischemic CM.  04/2018 EF 40-45%, grd I DD.  07/2021 EF 55-60%, AV well seated   Chronic pain syndrome    Lumbar DDD; chronic neuropathic pain (DM); has spinal stimulator and sees pain mgmt MD   Complicated UTI (urinary tract infection) 09/2019   Phimoses, acute urinary retention, entoerococcus UTI, enterococc bacteremia.  F/u blood clx's neg x 5d.     COPD    Debilitated patient    Diabetes mellitus type 2 with complications (HCC)    HbA1c jumped from 5.7% to 6.1% 03/2015---started metformin  at that time.  DM 2 dx by fasting gluc criteria 2018.  Has chronic neuropath pain   Enterococcal bacteremia 09/2019   Phimoses, acute urinary retention, entoerococcus UTI, enterococc bacteremia.  F/u blood clx's neg x 5d.     Essential hypertension, benign    Fatty liver 2007   2007 u/s showed fatty liver with hepatosplenomegaly.  2019 repeat u/s->fatty liver but no cirrhosis or hepatosplenomegaly.   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 Fleeta Rothman)   Iron deficiency anemia    09/2022.  Hemoccults x 3 neg Mar 2024.  Improving with oral iron. GI following, no endoscopies  Lumbosacral neuritis    Lumbosacral spondylosis    Lumbar spinal stenosis with neurogenic claudication--contributes to his chronic pain syndrome   Morbid obesity (HCC)    Normal memory function 08/2014   Neuropsychological testing ([facility]): no cognitive impairment or sign of neurodegenerative disorder.  Likely has adjustment d/o with mixed anxiety/depressed features and may benefit from low dose SNRI.     Normocytic anemia 03/2016   Mild-pt needs  ferritin and vit B12 level checked (as of 03/22/16). Hb stable 09/2019.  Hemoccult x 3 NEG 03/2022.  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 chronic?) Off anticoag for a while due to falls.  Then apixaban  started 12/2014.   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 Clorinda): neuropathic pain->diff to treat, failed nucynta , failed spinal stimul trial, oxycontin  hs + tramadol  + gabap as of 12/2017 f/u Dr. Carilyn.   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   Sick sinus syndrome (HCC)    PPM placed   Thrombocytopenia 2018   HSM on 2007 abd u/s---suspect some mild splenic sequestration chronically.   Unspecified glaucoma(365.9)    Unspecified hereditary and idiopathic peripheral neuropathy approx age 83   bilat LE's, ? left arm, too.  Feet became progressively numb + left foot pain intermittently.  Pt may be trying a spinal stimulator (as of 05/2015)   Vaccine counseling 11/16/2021   VENOUS INSUFFICIENCY    Being  followed by Dr. Harden as of 10/2016 for two R LL venous stasis ulcers/skin tears.  Healed as of 10/30/16 f/u with Dr. Harden.   Venous stasis dermatitis 12/27/2021   VENTRAL HERNIA      Significant Hospital Events: Including procedures, antibiotic start and stop dates in addition to other pertinent events   12/5 admit to [facility] 12/6 spiked fever with Tmax of 100.7, blood culture is growing ESBL gram-negative rods with E. coli/Klebsiella.  Vasopressor requirement improving, Levophed  at 13, vasopressin  at 0.03 unit.  Foley catheter was exchanged by urology, no more hematuria 12/8 norepinephrine  requirements coming down.  Platelet count stabilized.  LFTs climbing compared to last 72 hours  Interim History / Subjective:  No distress  Cont to wean pressors   Objective    Blood pressure 132/66, pulse 92, temperature 98.8 F (37.1 C), temperature source Oral, resp. rate 10, height 6' 4 (1.93 m), weight 120 kg, SpO2 94%. CVP:  [2 mmHg-59 mmHg] 8 mmHg      Intake/Output  Summary (Last 24 hours) at 06/09/2024 1006 Last data filed at 06/09/2024 9362 Gross per 24 hour  Intake 426.83 ml  Output 805 ml  Net -378.17 ml   Filed Weights   06/07/24 0500 06/08/24 0436 06/09/24 0500  Weight: 113.4 kg 118 kg 120 kg    Examination:  General This is a chronically ill-appearing 83 year old male currently lying in bed he is in no acute distress this morning on room air HEENT normocephalic atraumatic mucous membranes are moist no obvious jugular venous distention sclera nonicteric Pulmonary speaking in full sentences, currently on room air.  Decreased both bases. Cardiac tachycardic intermittently paced rhythm without murmur rub or gallop Extremities diffuse anasarca involving the upper and lower extremities with weeping lower extremities.  His right upper extremity is wrapped with weeping blisters pulses are palpable he has a brisk capillary refill Abdomen obese, bowel sounds present, no organomegaly.   Tolerating diet GU clear yellow via Foley catheter Neuro he is awake, oriented x 2, has poor recall, very depressed affect has generalized weakness further complicated by baseline hearing loss   Resolved problem list  Traumatic Foley catheter exchange leading to gross hematuria, resolved Acute metabolic acidosis/ Lactic acidosis Acute kidney injury due to septic ATN, resolved Hyponatremia, resolved Assessment and Plan   Septic shock 2/2 Acute complicated urinary tract infection in the setting of chronic indwelling Foley; w/ resultant ESBL E. coli/Klebsiella bacteremia (POA) Leukemoid reaction -cath replaced by Urology  -wbcs slowly improving -still pressor dep but NE down to 7 mcg/min and VPA is off Plan Cont IV meropenem  day 4 of appropriate therapy. Planning on 10 d rx  Continue to wean norepinephrine , will change titration goal to maintain systolic blood pressure greater than 100 Hydrocortisone  was weaned to daily and pressor requirements cont to trend down so will not resume  Hold home antihypertensives Contact precautions fort ESBL producer Cont tele  Am cbc. Holding steroids may also help w/ wbcs Repeating blood cultures   Total body volume overload currently 3.3 L positive History of aortic stenosis status post TAVR.  Last TEE in October 2025 showed ejection fraction 65 to 70% Plan Holding home meds for now    H/o BPH and chronic foley  Foley replaced 12/6 Plan F/u w/ urology   Paroxysmal A-fib with RVR; currently in CVR H/o CHB, has PPM.  Plan Tele Rate control  Currently holding his carvedilol and aspirin  Holding his home apixaban  Needs PPM replacement Feb 2026  Elevated LFTs Initially felt shock livers and LFTs were trending down. They have now increased over last 24hrs. He has had prior cholecystectomy -? DILI Plan Trend LFTs am   DIC w/ sepsis induced thrombocytopenia.  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 with amoxicillin  Plan Resume amoxicillin  (was on life long suppressive rx)  after completion of his ESBL infection   H/o c diff  Plan Cont oral vanc and stop 48 hrs after meropenem  completed  Hypoactive delirium Plan Supportive care  Diabetes type 2 Plan Ssi goal 140-180   COPD, not in exacerbation Plan Cont BDs: brovana , yupelri  and budesonide    Obesity Plan Diet council  RT following  Chronic pain syndrome Plan Cont tid gabapentin   Multiple pressure ulcers: Including sacrum, left anterior lower arm, right pretibial, right anterior lower arm Plan Maximize nutrition Pressure relief interventions WOC consultation   Difficult IV access Femoral arterial  and vein cath placed 12/5 Plan Weaning pressors Hope to dc aline in next 24 hrs Will repeat Blood cultures if clear could consider PICC        I personally  spent 55 minutes  on this patient which included: review of medical records, nursing notes, progress notes, evaluation, interpretation of lab data and diagnostic studies, taking independent history, performing exam, documenting plan, ordering diagnostics and interventions for the following critical care issues: Circulatory shock, Acute respiratory failure, Acute renal failure, Severe metabolic derangements with the following interventions which included: titration of hemodynamic drips to desired MAP, evaluation and management of acute hepatic failure , prevention of further deterioration

## 2024-06-09 NOTE — Consult Note (Signed)
 Please note that the [facility] nursing team is utilizing a standardized work plan to manage patient consults. We are triaging consults and will try to see the patients within 24 hours. Wound photos in the patient's chart allow us  to consult on the patient in the most efficient and timely manner.    Thank you,  Doyal Polite, MSN, RN, [facility] WOC Team [phone] (Available Mon-Fri [phone])

## 2024-06-09 NOTE — Telephone Encounter (Signed)
 Pharmacy Patient Advocate Encounter  Insurance verification completed.    The patient is insured through [facility]. Patient has Medicare and is not eligible for a copay card, but may be able to apply for patient assistance or Medicare RX Payment Plan (Patient Must reach out to their plan, if eligible for payment plan), if available.    Ran test claim for Vancomycin  125mg   Capsule and the current 30 day co-pay is $0.   This test claim was processed through [facility]- copay amounts may vary at other pharmacies due to [employer], or as the patient moves through the different stages of their insurance plan.

## 2024-06-09 NOTE — Progress Notes (Signed)
 No sig change over course of day Had good response to lasix  NE down to 1 mcg/min Plan Cont to slowly wean NE goal SBP >100 Cont abx Cont supportive care

## 2024-06-09 NOTE — Progress Notes (Addendum)
 Pharmacy Antibiotic Note  Tim [name] is a 83 y.o. male admitted on 06/05/2024 with ESBL Klebsiella and E coli bacteremia and E coli UTI.  Of note, was positive for C diff during recent admission 10/27-11/5 and treated with 10d of PO vancomycin  per ID recommendations - therapy ended 11/11.  *Initially r/o meningitis s/p amp, CFP, flagyl  x1 (vanc ordered, not given)  Today is D4 Merrem  and PO vancomycin  125 mg daily (C diff ppx dosing).   Now s/p stress steroids, vasopressin  weaned off and currently on NE@7 .  Per discussion with CCM, plan 10d total for ESBL GN bacteremia with Merrem  and continue PO vancomycin  for C diff ppx.  If clinical status worsens, low threshold to extend to 14 days.    Of note, was on amoxicillin  1000 mg BID PTA for chronic suppression with his history of enterococcal prosthetic mitral valve endocarditis and PPM infection.   Plan: Meropenem  1g IV q8h, EOT 12/14 PM PO vancomycin  125 mg daily, EOT 12/17 (2d post systemic abx) Then, START amoxicillin  1000 mg BID (PTA) chronic suppression on 12/15 (entered) Monitor clinical status, renal function, LFTs (c/f DILI but meropenem  carries low-risk so CTM for now)  Height: 6' 4 (193 cm) Weight: 120 kg (264 lb 8.8 oz) IBW/kg (Calculated) : 86.8  Temp (24hrs), Avg:98.3 F (36.8 C), Min:97.8 F (36.6 C), Max:98.8 F (37.1 C)  Recent Labs  Lab 06/06/24 0111 06/06/24 0604 06/06/24 0655 06/06/24 0656 06/06/24 0835 06/06/24 1210 06/06/24 1500 06/07/24 0357 06/08/24 0435 06/08/24 1650 06/09/24 0420 06/09/24 0645  WBC  --   --   --    < > 35.2* 44.1*  --  46.8* 56.7*  --  46.1*  --   CREATININE 1.20  --   --    < >  --   --  1.37* 1.22 0.79 0.64  --  0.81  LATICACIDVEN 5.0* 2.9* 2.8*  --  2.6*  --   --   --   --   --   --   --    < > = values in this interval not displayed.    Estimated Creatinine Clearance: 96.1 mL/min (by C-G formula based on SCr of 0.81 mg/dL).    Allergies  Allergen Reactions   Brimonidine  Tartrate Shortness Of Breath    Alphagan-Shortness of breath   Brinzolamide Shortness Of Breath    AZOPT- Shortness of breath   Latanoprost Shortness Of Breath    XALATAN- Shortness of breath   Nucynta  [Tapentadol ] Shortness Of Breath   Sulfa Antibiotics Palpitations   Timolol Maleate Shortness Of Breath and Other (See Comments)    TIMOPTIC- Aggravated asthma   Diltiazem Swelling     leg swelling   Rofecoxib Swelling     VIOXX- leg swelling   Vancomycin  Rash    Tolerates oral vanc   Codeine Other (See Comments)    Childhood reaction   Tamsulosin Other (See Comments)    Dizziness    Celecoxib Other (See Comments)    CELLBREX-confusion   Colchicine Diarrhea    diarrhea   Tape Rash    Antimicrobials this admission: Meropenem  12/5 > [12/14] PO vancomycin  12/5 > [12/16] Amp/CFP/Zyvox /Flagyl  x1 12/5   Microbiology results: 12/5 BCx: ESBL E. Coli, Klebsiella pnumoniae (both ESBL, both S: meropenem , quinolones) 12/5 UCx: ESBL E. coli  12/5 MRSA PCR: negative  Thank you for allowing pharmacy to be a part of this patient's care.  Maurilio Fila, PharmD Clinical Pharmacist 06/09/2024  2:16 PM

## 2024-06-10 ENCOUNTER — Other Ambulatory Visit: Payer: Self-pay

## 2024-06-10 DIAGNOSIS — R45851 Suicidal ideations: Secondary | ICD-10-CM

## 2024-06-10 DIAGNOSIS — R5381 Other malaise: Secondary | ICD-10-CM | POA: Diagnosis present

## 2024-06-10 DIAGNOSIS — G9340 Encephalopathy, unspecified: Secondary | ICD-10-CM | POA: Diagnosis present

## 2024-06-10 LAB — COMPREHENSIVE METABOLIC PANEL WITH GFR
ALT: 256 U/L — ABNORMAL HIGH (ref 0–44)
AST: 370 U/L — ABNORMAL HIGH (ref 15–41)
Albumin: 1.8 g/dL — ABNORMAL LOW (ref 3.5–5.0)
Alkaline Phosphatase: 218 U/L — ABNORMAL HIGH (ref 38–126)
Anion gap: 11 (ref 5–15)
BUN: 22 mg/dL (ref 8–23)
CO2: 22 mmol/L (ref 22–32)
Calcium: 7.8 mg/dL — ABNORMAL LOW (ref 8.9–10.3)
Chloride: 105 mmol/L (ref 98–111)
Creatinine, Ser: 0.61 mg/dL (ref 0.61–1.24)
GFR, Estimated: >60 mL/min (ref >60)
Glucose, Bld: 100 mg/dL — ABNORMAL HIGH (ref 70–99)
Potassium: 3.6 mmol/L (ref 3.5–5.1)
Sodium: 138 mmol/L (ref 135–145)
Total Bilirubin: 1.1 mg/dL (ref 0.0–1.2)
Total Protein: 4.5 g/dL — ABNORMAL LOW (ref 6.5–8.1)

## 2024-06-10 LAB — CBC
HCT: 26.3 % — ABNORMAL LOW (ref 39.0–52.0)
Hemoglobin: 9 g/dL — ABNORMAL LOW (ref 13.0–17.0)
MCH: 32.6 pg (ref 26.0–34.0)
MCHC: 34.2 g/dL (ref 30.0–36.0)
MCV: 95.3 fL (ref 80.0–100.0)
Platelets: 32 K/uL — ABNORMAL LOW (ref 150–400)
RBC: 2.76 MIL/uL — ABNORMAL LOW (ref 4.22–5.81)
RDW: 14.7 % (ref 11.5–15.5)
WBC: 24 K/uL — ABNORMAL HIGH (ref 4.0–10.5)
nRBC: 0 % (ref 0.0–0.2)

## 2024-06-10 LAB — GLUCOSE, CAPILLARY
Glucose-Capillary: 96 mg/dL (ref 70–99)
Glucose-Capillary: 108 mg/dL — ABNORMAL HIGH (ref 70–99)
Glucose-Capillary: 106 mg/dL — ABNORMAL HIGH (ref 70–99)
Glucose-Capillary: 30 mg/dL — CL (ref 70–99)
Glucose-Capillary: 40 mg/dL — CL (ref 70–99)
Glucose-Capillary: 57 mg/dL — ABNORMAL LOW (ref 70–99)
Glucose-Capillary: 96 mg/dL (ref 70–99)
Glucose-Capillary: 102 mg/dL — ABNORMAL HIGH (ref 70–99)

## 2024-06-10 LAB — DIC (DISSEMINATED INTRAVASCULAR COAGULATION)PANEL
D-Dimer, Quant: 6.4 ug{FEU}/mL — ABNORMAL HIGH (ref 0.00–0.50)
Fibrinogen: 359 mg/dL (ref 210–475)
INR: 1.2 (ref 0.8–1.2)
Platelets: 35 K/uL — ABNORMAL LOW (ref 150–400)
Prothrombin Time: 15.4 s — ABNORMAL HIGH (ref 11.4–15.2)
Smear Review: NONE SEEN
aPTT: 28 s (ref 24–36)

## 2024-06-10 LAB — MAGNESIUM: Magnesium: 1.8 mg/dL (ref 1.7–2.4)

## 2024-06-10 LAB — BILIRUBIN, FRACTIONATED(TOT/DIR/INDIR)
Bilirubin, Direct: 0.2 mg/dL (ref 0.0–0.2)
Indirect Bilirubin: 0.9 mg/dL (ref 0.3–0.9)
Total Bilirubin: 1.1 mg/dL (ref 0.0–1.2)

## 2024-06-10 LAB — LACTATE DEHYDROGENASE: LDH: 390 U/L — ABNORMAL HIGH (ref 105–235)

## 2024-06-10 MED ORDER — ENSURE PLUS HIGH PROTEIN PO LIQD
237.0000 mL | Freq: Two times a day (BID) | ORAL | Status: DC
  Administered 2024-06-11 (×2): 237 mL via ORAL

## 2024-06-10 MED ORDER — SODIUM CHLORIDE 0.9% FLUSH: 10.0000 mL | Freq: Two times a day (BID) | INTRAVENOUS | Status: DC

## 2024-06-10 MED ORDER — FUROSEMIDE 10 MG/ML IJ SOLN
40.0000 mg | Freq: Once | INTRAMUSCULAR | Status: AC
  Administered 2024-06-10: 40 mg via INTRAVENOUS
  Filled 2024-06-10: qty 4

## 2024-06-10 MED ORDER — FAMOTIDINE 20 MG PO TABS
20.0000 mg | ORAL_TABLET | Freq: Two times a day (BID) | ORAL | Status: DC
  Administered 2024-06-10 – 2024-06-24 (×30): 20 mg via ORAL
  Filled 2024-06-10 (×31): qty 1

## 2024-06-10 MED ORDER — LIDOCAINE HCL URETHRAL/MUCOSAL 2 % EX GEL
1.0000 | Freq: Once | CUTANEOUS | Status: DC
  Filled 2024-06-10: qty 6

## 2024-06-10 MED ORDER — MAGNESIUM SULFATE 2 GM/50ML IV SOLN
2.0000 g | Freq: Once | INTRAVENOUS | Status: AC
  Administered 2024-06-10: 2 g via INTRAVENOUS
  Filled 2024-06-10: qty 50

## 2024-06-10 MED ORDER — SODIUM CHLORIDE 0.9% FLUSH: 10.0000 mL | INTRAVENOUS | Status: DC | PRN

## 2024-06-10 MED ORDER — POTASSIUM CHLORIDE CRYS ER 20 MEQ PO TBCR
40.0000 meq | EXTENDED_RELEASE_TABLET | Freq: Once | ORAL | Status: AC
  Administered 2024-06-10: 40 meq via ORAL
  Filled 2024-06-10: qty 2

## 2024-06-10 MED ORDER — SODIUM CHLORIDE 0.9% FLUSH
10.0000 mL | Freq: Two times a day (BID) | INTRAVENOUS | Status: DC
  Administered 2024-06-10 – 2024-06-14 (×9): 10 mL
  Administered 2024-06-16: 11:00:00 20 mL
  Administered 2024-06-16 – 2024-06-23 (×12): 10 mL

## 2024-06-10 MED ORDER — DULOXETINE HCL 20 MG PO CPEP
20.0000 mg | ORAL_CAPSULE | Freq: Every day | ORAL | Status: DC
  Administered 2024-06-10 – 2024-06-13 (×4): 20 mg via ORAL
  Filled 2024-06-10 (×5): qty 1

## 2024-06-10 MED ORDER — MIDODRINE HCL 5 MG PO TABS
5.0000 mg | ORAL_TABLET | Freq: Once | ORAL | Status: AC
  Administered 2024-06-10: 5 mg via ORAL
  Filled 2024-06-10: qty 1

## 2024-06-10 MED ORDER — HYDROXYZINE HCL 25 MG PO TABS
25.0000 mg | ORAL_TABLET | Freq: Three times a day (TID) | ORAL | Status: DC | PRN
  Administered 2024-06-10 – 2024-06-13 (×2): 25 mg via ORAL
  Filled 2024-06-10 (×2): qty 1

## 2024-06-10 MED ORDER — MIDODRINE HCL 5 MG PO TABS
5.0000 mg | ORAL_TABLET | Freq: Three times a day (TID) | ORAL | Status: DC
  Administered 2024-06-10 – 2024-06-16 (×18): 5 mg via ORAL
  Filled 2024-06-10 (×18): qty 1

## 2024-06-10 NOTE — Progress Notes (Signed)
 Peripherally Inserted Central Catheter Placement  The IV Nurse has discussed with the patient and/or persons authorized to consent for the patient, the purpose of this procedure and the potential benefits and risks involved with this procedure.  The benefits include less needle sticks, lab draws from the catheter, and the patient may be discharged home with the catheter. Risks include, but not limited to, infection, bleeding, blood clot (thrombus formation), and puncture of an artery; nerve damage and irregular heartbeat and possibility to perform a PICC exchange if needed/ordered by physician.  Alternatives to this procedure were also discussed.  Bard Power PICC patient education guide, fact sheet on infection prevention and patient information card has been provided to patient /or left at bedside.    PICC Placement Documentation    RUE PICC attempted by Remonia Brought, RN, unable to advance line centrally. PICC removed.     Matthias Merle Chenice 06/10/2024, 4:11 PM

## 2024-06-10 NOTE — Progress Notes (Signed)
 [name] ADULT ICU REPLACEMENT PROTOCOL   The patient does apply for the [facility] Adult ICU Electrolyte Replacment Protocol based on the criteria listed below:   1.Exclusion criteria: TCTS, ECMO, Dialysis, and Myasthenia Gravis patients 2. Is GFR >/= 30 ml/min? Yes.    Patient's GFR today is >60 3. Is SCr </= 2? Yes.   Patient's SCr is 0.61 mg/dL 4. Did SCr increase >/= 0.5 in 24 hours? No. 5.Pt's weight >40kg  Yes.   6. Abnormal electrolyte(s): potassium 3.6, mag 1.8  7. Electrolytes replaced per protocol 8.  Call MD STAT for K+ </= 2.5, Phos </= 1, or Mag </= 1 Physician:  protocol  Claretta JINNY Sharps 06/10/2024 6:05 AM

## 2024-06-10 NOTE — Progress Notes (Signed)
 NAME:  Tim [name], MRN:  [id number], DOB:  1941/03/01, LOS: 4 ADMISSION DATE:  06/05/2024, CONSULTATION DATE:  12/5 REFERRING MD:  Dr. 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  Patient has history of enterococcal infection of the AICD lead that is being treated with chronic amoxicillin .  Patient recently admitted to [facility] on 10/9-10/17 with mixed hypovolemic/septic shock.  Patient been admitted to [facility] on 10/29-11/1 with [name] treated w/ vanc. Wife states he has not had diarrhea symptoms and off po vanc. Did have diarrhea on 12/3 and so did wife. Wife also states he has poor po intake. On 12/4 overnight patient had high fever. EMS called found temp of 103.7. States currently on po vanc for c diff. On arrival to [facility] ED temp 99.3 F and wbc 6.7. LA 5. Chronic foley in place w/ hematuria which was replaced. UA inconclusive. INR 3.7. CXR unremarkable. Cultures obtained and started on broad spectrum abx. Despite fluids remains hypotensive started on levo. PCCM consulted.   Pertinent  Medical History   Past Medical History:  Diagnosis Date   AICD (automatic cardioverter/defibrillator) present    Balanitis    +severe phimosis+ buried penis->circ not possible but dorsal slit done 10/2019   BPH (benign prostatic hypertrophy)    with urinary retention.  Renal u/s 12/04/19 NORMAL KIDNEYS, NORMAL BLADDER, NO HYDRONEPHROSIS   CAD (coronary artery disease)    Nonobstructive by 12/2012 cath; then 03/2016 he required BMS to RCA ([facility]).  In-stent restenosis on cath 11/01/16, baloon angioplasty successful.   CATARACT, HX OF    Chronic combined systolic and diastolic CHF (congestive heart failure)  (HCC) 05/2016   Ischemic CM.  04/2018 EF 40-45%, grd I DD.  07/2021 EF 55-60%, AV well seated   Chronic pain syndrome    Lumbar DDD; chronic neuropathic pain (DM); has spinal stimulator and sees pain mgmt MD   Complicated UTI (urinary tract infection) 09/2019   Phimoses, acute urinary retention, entoerococcus UTI, enterococc bacteremia.  F/u blood clx's neg x 5d.     COPD    Debilitated patient    Diabetes mellitus type 2 with complications (HCC)    HbA1c jumped from 5.7% to 6.1% 03/2015---started metformin  at that time.  DM 2 dx by fasting gluc criteria 2018.  Has chronic neuropath pain   Enterococcal bacteremia 09/2019   Phimoses, acute urinary retention, entoerococcus UTI, enterococc bacteremia.  F/u blood clx's neg x 5d.     Essential hypertension, benign    Fatty liver 2007   2007 u/s showed fatty liver with hepatosplenomegaly.  2019 repeat u/s->fatty liver but no cirrhosis or hepatosplenomegaly.   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 Fleeta Rothman)   Iron deficiency anemia    09/2022.  Hemoccults x 3 neg Mar 2024.  Improving with oral iron. GI following, no endoscopies  Lumbosacral neuritis    Lumbosacral spondylosis    Lumbar spinal stenosis with neurogenic claudication--contributes to his chronic pain syndrome   Morbid obesity (HCC)    Normal memory function 08/2014   Neuropsychological testing ([facility]): no cognitive impairment or sign of neurodegenerative disorder.  Likely has adjustment d/o with mixed anxiety/depressed features and may benefit from low dose SNRI.     Normocytic anemia 03/2016   Mild-pt needs  ferritin and vit B12 level checked (as of 03/22/16). Hb stable 09/2019.  Hemoccult x 3 NEG 03/2022.  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 chronic?) Off anticoag for a while due to falls.  Then apixaban  started 12/2014.   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 Clorinda): neuropathic pain->diff to treat, failed nucynta , failed spinal stimul trial, oxycontin  hs + tramadol  + gabap as of 12/2017 f/u Dr. Carilyn.   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   Sick sinus syndrome (HCC)    PPM placed   Thrombocytopenia 2018   HSM on 2007 abd u/s---suspect some mild splenic sequestration chronically.   Unspecified glaucoma(365.9)    Unspecified hereditary and idiopathic peripheral neuropathy approx age 89   bilat LE's, ? left arm, too.  Feet became progressively numb + left foot pain intermittently.  Pt may be trying a spinal stimulator (as of 05/2015)   Vaccine counseling 11/16/2021   VENOUS INSUFFICIENCY    Being  followed by Dr. Harden as of 10/2016 for two R LL venous stasis ulcers/skin tears.  Healed as of 10/30/16 f/u with Dr. Harden.   Venous stasis dermatitis 12/27/2021   VENTRAL HERNIA      Significant Hospital Events: Including procedures, antibiotic start and stop dates in addition to other pertinent events   12/5 admit to [facility] 12/6 spiked fever with Tmax of 100.7, blood culture is growing ESBL gram-negative rods with E. coli/Klebsiella.  Vasopressor requirement improving, Levophed  at 13, vasopressin  at 0.03 unit.  Foley catheter was exchanged by urology, no more hematuria 12/8 norepinephrine  requirements coming down.  Platelet count stabilized.  LFTs climbing compared to last 72 hours  Interim History / Subjective:  No distress  Cont to wean pressors   Objective    Blood pressure 132/66, pulse (!) 104, temperature 98.2 F (36.8 C), temperature source Oral, resp. rate 14, height 6' 4 (1.93 m), weight 118.9 kg, SpO2 99%. CVP:  [0 mmHg-11 mmHg] 8 mmHg  Intake/Output Summary (Last 24 hours) at 06/10/2024 1123 Last data filed at 06/10/2024 0900 Gross per 24 hour  Intake 529.87 ml  Output 1810 ml  Net -1280.13 ml   Filed Weights   06/08/24 0436 06/09/24 0500 06/10/24 0500  Weight: 118 kg 120 kg 118.9 kg    Examination:  General laying in bed no distress HENT NCAT no JVD MMM Pulm dec'd bases no accessory use  Card rrr no MRG Abd soft + bowel sounds Ext diffuse anasarca. Left arm wrapped w/ scattered areas of ecchymosis LE edema as well w/ weeping blistering. Numerous pressure ulcers new ulceration noted under CVL dressing Neuro awake affect flat and sp slow. Generalized weakness. Oriented x 3 but confused w very minimal recall and inattentive  Gu cl yellow    Resolved problem list  Traumatic Foley catheter exchange leading to gross hematuria, resolved Acute metabolic acidosis/ Lactic acidosis Acute kidney injury due to septic ATN, resolved Hyponatremia, resolved Assessment  and Plan   Septic shock 2/2 Acute complicated urinary tract infection in the setting of chronic indwelling Foley; w/ resultant ESBL E. coli/Klebsiella bacteremia (POA) Leukemoid reaction -cath replaced by Urology  -wbcs slowly improving -scr stable w/ lower SBP goal. I suspect that there is a component of chronic hypotension  Plan Cont IV meropenem  day 5 of appropriate therapy. Planning on 10 d rx  SBP goal 90 Add midodrine  tid Hold home antihypertensives Contact precautions fort ESBL producer Cont tele  Am cbc.  Repeating blood cultures   Total body volume overload currently 3.3 L positive History of aortic stenosis status post TAVR.  Last TEE in October 2025 showed ejection fraction 65 to 70% Plan Holding home meds for now  Lasix  again x 1 today Will need daily assessment   H/o BPH and chronic foley  Foley replaced 12/6 Plan F/u w/ urology   Paroxysmal A-fib with RVR; currently in CVR H/o CHB, has PPM.  Plan Tele Rate control  Currently holding his carvedilol and aspirin  Holding his home apixaban  Needs PPM replacement Feb 2026  Elevated LFTs Initially felt shock livers and LFTs were trending down. They increased on 12/8 but are now trending down again Plan Trend LFTs am  Sending auto-immune workup   sepsis induced thrombocytopenia (presumed).  due to gram-negative sepsis Plts still low in spite of what seems to be improving sepsis picture. He has not been on heparin .  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 Has hearing loss that is likely contributing.  Wife feels he would act on intent if able  Plan Supportive care Psych consulted  Starting cymbalta  and hydroxyzine     Diabetes type 2 Plan Ssi goal 140-180   COPD, not in exacerbation Plan Cont BDs: brovana , yupelri  and budesonide    Obesity Plan Diet council  RT following  Chronic pain syndrome Plan Cont tid gabapentin  @ reduced dose   Multiple pressure ulcers: Including sacrum, left anterior lower arm, right pretibial, right anterior lower arm Plan Maximize nutrition Pressure relief interventions WOC consultation completed see 12/8  Difficult IV access Femoral  arterial and vein cath placed 12/5 Plan  Will repeat Blood cultures  Ask IV team to eval for PICC  Dc a line      My time 55 min included: review of most recent records, direct face to face time obtaining history, performing physical exam, developing and documenting plan as well as discussing this plan with the patient and/or care givers.  [id number] new problem significant >45 min

## 2024-06-10 NOTE — Progress Notes (Signed)

## 2024-06-10 NOTE — Consult Note (Cosign Needed Addendum)
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 source Oral, resp. rate 18, height 6' 4 (1.93 m), weight 118.9 kg, SpO2 96%. Body mass index is 31.91 kg/m.

## 2024-06-10 NOTE — TOC Initial Note (Signed)
 Transition of Care [facility]) - Initial/Assessment Note    Patient Details  Name: Tim [name] MRN: [id number] Date of Birth: Mar 08, 1941  [facility]) CM/SW Contact:    Isaiah Public, LCSWA Phone Number: 06/10/2024, 2:21 PM  Clinical Narrative:                  Patient from home. Patient presents with shock. Psych and PT/OT consulted. ICM will continue to follow and await psych and PT/OT recommendations.          Patient Goals and CMS Choice            Expected Discharge Plan and Services                                              Prior Living Arrangements/Services                       Activities of Daily Living   ADL Screening (condition at time of admission) Independently performs ADLs?: No Does the patient have a NEW difficulty with bathing/dressing/toileting/self-feeding that is expected to last >3 days?: Yes (Initiates electronic notice to provider for possible OT consult) Does the patient have a NEW difficulty with getting in/out of bed, walking, or climbing stairs that is expected to last >3 days?: No Does the patient have a NEW difficulty with communication that is expected to last >3 days?: No Is the patient deaf or have difficulty hearing?: Yes Does the patient have difficulty seeing, even when wearing glasses/contacts?: No Does the patient have difficulty concentrating, remembering, or making decisions?: No  Permission Sought/Granted                  Emotional Assessment              Admission diagnosis:  Septic shock (HCC) [A41.9, R65.21] Patient Active Problem List   Diagnosis Date Noted   Septic shock (HCC) 06/06/2024   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   AKI (acute kidney injury) 04/12/2024   Confusion 04/12/2024   Uremia 04/12/2024   Shock (HCC) 04/10/2024   Osteomyelitis of left foot (HCC) 05/03/2023   Venous stasis dermatitis 12/27/2021   Cellulitis 12/14/2021   Wound of right  lower extremity 12/14/2021   Transient hypotension 12/14/2021   Heart failure with preserved ejection fraction (HCC) 12/14/2021   Prediabetes 12/14/2021   Thrombocytopenia 12/14/2021   Obesity, Class III, BMI 40-49.9 (morbid obesity) (HCC) 12/14/2021   BPH (benign prostatic hyperplasia) 12/14/2021   Vaccine counseling 11/16/2021   Weakness 02/23/2021   Physical deconditioning 02/23/2021   Rhinitis 07/14/2020   Mitral valve vegetation 05/28/2020   Non-pressure chronic ulcer of other part of left foot limited to breakdown of skin (HCC)    Esophageal dysphagia    Bacteremia    Endocarditis of mitral valve    Fever 04/28/2020   Cervical lymphadenopathy 03/15/2020   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   ICD (implantable cardioverter-defibrillator) infection 12/22/2019   Gram-positive bacteremia 11/27/2019   FUO (fever of unknown origin) 11/26/2019   Hypotension 11/05/2019   Wound dehiscence 10/22/2019   PFO (patent foramen ovale) 10/06/2019   ICD (implantable cardioverter-defibrillator) battery depletion    Pressure injury of skin 09/13/2019   Enterococcal bacteremia history  09/13/2019   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  Urinary retention 09/13/2019   Phimosis 09/13/2019   SIRS (systemic inflammatory response syndrome) (HCC) 09/12/2019   Sepsis (HCC) 09/12/2019   Enterococcus UTI 09/12/2019   Cellulitis of foot    History of transmetatarsal amputation of left foot (HCC)    Subacute osteomyelitis, left ankle and foot (HCC)    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) 04/17/2018   Diabetic neuropathy, painful (HCC) 12/26/2016   Neuropathic pain of foot, left 06/20/2016   Insulin  resistance 07/22/2015   Status post amputation of left great toe 06/08/2015   Spinal stenosis of lumbar region 02/16/2015   Hereditary and  idiopathic peripheral neuropathy 01/12/2015   Oral thrush 12/28/2014   Orthostatic dizziness 05/24/2014   Paresthesias with subjective weakness 01/30/2014   Bilateral thigh pain 01/30/2014   Abdominal wall pain in right upper quadrant 01/22/2014   Gross hematuria 09/25/2013   Intertrigo 09/25/2013   IBS (irritable bowel syndrome) 09/11/2013   Chronic pain syndrome 08/06/2013   CAD (coronary artery disease), native coronary artery 12/25/2012   PAF (paroxysmal atrial fibrillation) (HCC) 12/25/2012   Moderate aortic stenosis 12/25/2012   Aortic stenosis 12/24/2012   History of colonic polyps 10/30/2012   Diverticulosis of colon 10/30/2012   Internal hemorrhoids 10/30/2012   Change in bowel habits intermittent loose stools 10/30/2012   Routine health maintenance 06/09/2012   Hereditary peripheral neuropathy 10/10/2011   Long term current use of anticoagulant - apixiban 08/16/2010   Dyspnea on exertion 09/08/2009   HYPERCHOLESTEROLEMIA-PURE 01/13/2009   Essential hypertension, benign 01/13/2009   EDEMA 01/13/2009   GOUT 01/12/2009   GLAUCOMA 01/12/2009   Venous insufficiency (chronic) (peripheral) 01/12/2009   COPD (chronic obstructive pulmonary disease) (HCC) 01/12/2009   VENTRAL HERNIA 01/12/2009   OSTEOARTHRITIS 01/12/2009   ARTHRITIS 01/12/2009   DEGENERATIVE DISC DISEASE 01/12/2009   CATARACT, HX OF 01/12/2009   HEART MURMUR, HX OF 01/12/2009   BENIGN PROSTATIC HYPERTROPHY, HX OF 01/12/2009   Obesity 01/17/2007   OBSTRUCTIVE SLEEP APNEA 01/17/2007   ASTHMA 01/17/2007   GERD 01/17/2007   HALLUX VALGUS, ACQUIRED 01/17/2007   Pulmonary hypertension (HCC) 01/17/2007   PCP:  [name], MD Pharmacy:   [id number] - [place] - [address] [place]  [place] [address] Phone: [phone] Fax: [phone]  [name] [address] [place] [address] Phone: [phone] Fax:  [phone]     Social Drivers of Health (SDOH) Social History: SDOH Screenings   Food Insecurity: No Food Insecurity (06/07/2024)  Housing: Low Risk  (06/07/2024)  Recent Concern: Housing - High Risk (05/01/2024)   Received from [facility]  Transportation Needs: No Transportation Needs (06/07/2024)  Utilities: Not At Risk (06/07/2024)  Depression (PHQ2-9): Low Risk  (03/31/2024)  Financial Resource Strain: Low Risk  (09/23/2023)  Physical Activity: Unknown (09/23/2023)  Social Connections: Moderately Isolated (06/07/2024)  Stress: No Stress Concern Present (05/01/2024)   Received from [facility]  Tobacco Use: Low Risk  (06/05/2024)   SDOH Interventions:     Readmission Risk Interventions    04/15/2024    3:20 PM 04/14/2024    4:33 PM  Readmission Risk Prevention Plan  Transportation Screening Complete Complete  PCP or Specialist Appt within 5-7 Days Complete Complete  Home Care Screening Complete Complete  Medication Review (RN CM) Complete Referral to Pharmacy

## 2024-06-10 NOTE — Plan of Care (Signed)

## 2024-06-10 NOTE — TOC Progression Note (Addendum)
 Transition of Care [name]) - Progression Note    Patient Details  Name: Tim [name] MRN: [id number] Date of Birth: 1940-08-12  Transition of [facility]) CM/SW Contact  Graves-Bigelow, Erminio Deems, RN Phone Number: 06/10/2024, 2:56 PM  Clinical Narrative: Patient presented for AMS and fever. Spouse was at the bedside during the time of the visit. PTA patient was from home with spouse. Patient has DME: cane, rolling walker, wheelchair, and rollator in the home. Patient is currently active with [facility] RN/PT- may benefit from Aide. Patient will need orders and F2F if the plan continues to be for home-PT/OT has been consulted. ICM will continue to follow for additional needs as the patient gets closer to discharge.    Expected Discharge Plan: Home w Home Health Services Barriers to Discharge: Continued Medical Work up  Expected Discharge Plan and Services   Discharge Planning Services: CM Consult Post Acute Care Choice: Home Health, Resumption of Svcs/PTA Provider Living arrangements for the past 2 months: Single Family Home    HH Arranged: RN, PT [facility] Agency: [facility] Date HH Agency Contacted: 06/10/24 Time HH Agency Contacted: 1455 Representative spoke with at [facility] Agency: [place]   Social Drivers of Health (SDOH) Interventions SDOH Screenings   Food Insecurity: No Food Insecurity (06/07/2024)  Housing: Low Risk  (06/07/2024)  Recent Concern: Housing - High Risk (05/01/2024)   Received from [facility]  Transportation Needs: No Transportation Needs (06/07/2024)  Utilities: Not At Risk (06/07/2024)  Depression (PHQ2-9): Low Risk  (03/31/2024)  Financial Resource Strain: Low Risk  (09/23/2023)  Physical Activity: Unknown (09/23/2023)  Social Connections: Moderately Isolated (06/07/2024)  Stress: No Stress Concern Present (05/01/2024)   Received from [facility]  Tobacco Use: Low Risk  (06/05/2024)    Readmission Risk Interventions     04/15/2024    3:20 PM 04/14/2024    4:33 PM  Readmission Risk Prevention Plan  Transportation Screening Complete Complete  PCP or Specialist Appt within 5-7 Days Complete Complete  Home Care Screening Complete Complete  Medication Review (RN CM) Complete Referral to Pharmacy

## 2024-06-10 NOTE — Progress Notes (Signed)
 4W Coude team contacted by primary nurse at 2025 to change catheter from 24Fr to 16Fr coude catheter per order; Coude team unable to be reached at this time. Multiple attempts to contact coude team were made and unsuccessful during shift; Oncoming day shift RN and provider made aware.

## 2024-06-10 NOTE — Progress Notes (Signed)
     Subjective: Pt alert but quite confused. Accompanied by wife. Urine remains clear yellow.   Objective: Vital signs in last 24 hours: Temp:  [97.6 F (36.4 C)-98.8 F (37.1 C)] 98.2 F (36.8 C) (12/09 0600) Pulse Rate:  [84-102] 93 (12/09 0045) Resp:  [3-23] 18 (12/09 0045) BP: (132)/(66) 132/66 (12/08 0800) SpO2:  [91 %-99 %] 98 % (12/09 0045) Arterial Line BP: (88-142)/(37-69) 109/45 (12/09 0045) Weight:  [118.9 kg] 118.9 kg (12/09 0500)  Assessment/Plan: # Gross hematuria # DIC # Traumatic Foley placement  CBI remains clamped. Clear yellow urine. To avoid erosion, would recommend exchanging 3-way hematuria catheter for 62f coude later this week if his urine remains clear.  Intake/Output from previous day: 12/08 0701 - 12/09 0700 In: 372 [I.V.:72; IV Piggyback:300] Out: 1810 [Urine:1810]  Intake/Output this shift: No intake/output data recorded.  Physical Exam:  General: Alert and confused CV: No cyanosis Lungs: equal chest rise Gu: 3 way hematuria coude in place draining clear yellow urine, 3rd prot capped.   Lab Results: Recent Labs    06/08/24 0435 06/09/24 0420 06/10/24 0410  HGB 10.5* 9.7* 9.0*  HCT 30.4* 29.0* 26.3*   BMET Recent Labs    06/09/24 0420 06/09/24 0645 06/10/24 0410  NA  --  139 138  K  --  3.7 3.6  CL  --  104 105  CO2  --  26 22  GLUCOSE  --  111* 100*  BUN  --  25* 22  CREATININE  --  0.81 0.61  CALCIUM   --  8.1* 7.8*  HGB 9.7*  --  9.0*  WBC 46.1*  --  24.0*     Studies/Results: No results found.    LOS: 4 days   Ole Bourdon, NP [facility] Pager: [phone]  06/10/2024, 7:07 AM

## 2024-06-10 NOTE — Progress Notes (Signed)
 PT Cancellation Note  Patient Details Name: Tim [name] MRN: [id number] DOB: 07-28-1940   Cancelled Treatment:    Reason Eval/Treat Not Completed: Patient at procedure or test/unavailable, pt receiving PICC line upon my arrival, and RN reports plan is to remove CVC after PICC line placed which will require 1hour bedrest. Will continue to follow and evaluate as time/schedule allows.   Izetta Call, PT, DPT   Acute Rehabilitation Department Office [phone] Secure Chat Communication Preferred   [name] 06/10/2024, 2:40 PM

## 2024-06-10 NOTE — Progress Notes (Signed)
 Daily Progress Note   Patient Name: Tim [name]       Date: 06/10/2024 DOB: 04-04-1941  Age: 83 y.o. MRN#: [id number] Attending Physician: Gretta Leita SQUIBB, DO Primary Care Physician: [name], MD Admit Date: 06/05/2024 Length of Stay: 4 days  Reason for Consultation/Follow-up: Establishing goals of care  Subjective:  Subjective: Chart reviewed including persona review of pertinent labs, imaging, and notes from palliative care and PCCM.  I saw and examined Mr. Michalowski today.  His wife was at the bedside as well.  He reports that he is having a rough day overall.  He feels as though he gets confused and this leads to a lot of distress.  He expressed being worried that he was going to get a divorce and would not know how to function without his wife.  He relays that when he is feeling sick or out of it he thinks he may be better off dead.  Expressed that he had told some other providers about considering suicide.  He then referred again to being confused and relays story where the last time he was in the hospital he thought that he was living in a [place] in [place].  Again stated he has difficulty determining what is real at times.  He is very circular in conversation and has trouble expressing himself today.  Discussed plan to continue current care and go day by day.  Palliative to continue to follow.  Review of Systems Reports confusion Objective:   Vital Signs:  BP 132/66 Comment (BP Location): ALINE  Pulse 93   Temp 98.2 F (36.8 C) (Oral)   Resp 18   Ht 6' 4 (1.93 m)   Wt 118.9 kg   SpO2 98%   BMI 31.91 kg/m   Physical Exam: General: NAD, alert, ? confused Eyes:  anicteric sclera HENT: normocephalic, atraumatic, moist mucous membranes Cardiovascular: RRR Respiratory: no increased work of breathing noted, not in respiratory distress Abdomen: not distended Neuro: A&O, following commands easily Psych: flat affect, tangential with circular thought  patterns  Imaging: @IMAGES @  I personally reviewed recent imaging.   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  Recommendations/Plan: # Complex medical decision making/goals of care:  -  Continue current care, allow time for outcomes.  -  Code Status: Limited: Do not attempt resuscitation (DNR) -DNR-LIMITED -Do Not Intubate/DNI   Prognosis: Unable to determine  # Psychosocial Support:  - Mr. Kuper mentioned suicide during my encounter in the context of feeling that he has been confused, feeling poorly, and is concerned about not being with his wife.  He reports he does not know what is real sometimes.  Discussed plan for psych consult and he is agreeable.  # Discharge Planning: To Be Determined  Discussed with: patient, wife, PCCM  Thank you for allowing the palliative care team to participate in the care [name].  Amaryllis Meissner, MD Palliative Care Provider PMT # [phone]  If patient remains symptomatic despite maximum doses, please call PMT at [phone] between 0700 and 1900. Outside of these hours, please call attending, as PMT does not have night coverage.   I personally spent a total of 40 minutes in the care of the patient today including preparing to see the patient, getting/reviewing separately  obtained history, performing a medically appropriate exam/evaluation, counseling and educating, and documenting clinical information in the EHR.

## 2024-06-10 NOTE — Progress Notes (Signed)
 OT Cancellation Note  Patient Details Name: Tim [name] MRN: [id number] DOB: [birth year]   Cancelled Treatment:    Reason Eval/Treat Not Completed: Medical issues which prohibited therapy (pt receiving PICC line upon my arrival, and RN reports plan is to remove CVC after PICC line placed which will require 1hour bedrest. Will continue to follow and evaluate as time/schedule allows.)  Kimbra Marcelino K, OTD, OTR/L SecureChat Preferred Acute Rehab [phone]   Jaylyn Iyer K Koonce 06/10/2024, 3:00 PM

## 2024-06-11 LAB — COMPREHENSIVE METABOLIC PANEL WITH GFR
ALT: 196 U/L — ABNORMAL HIGH (ref 0–44)
AST: 202 U/L — ABNORMAL HIGH (ref 15–41)
Albumin: 1.8 g/dL — ABNORMAL LOW (ref 3.5–5.0)
Alkaline Phosphatase: 212 U/L — ABNORMAL HIGH (ref 38–126)
Anion gap: 4 — ABNORMAL LOW (ref 5–15)
BUN: 19 mg/dL (ref 8–23)
CO2: 30 mmol/L (ref 22–32)
Calcium: 7.7 mg/dL — ABNORMAL LOW (ref 8.9–10.3)
Chloride: 105 mmol/L (ref 98–111)
Creatinine, Ser: 0.81 mg/dL (ref 0.61–1.24)
GFR, Estimated: >60 mL/min (ref >60)
Glucose, Bld: 90 mg/dL (ref 70–99)
Potassium: 4 mmol/L (ref 3.5–5.1)
Sodium: 139 mmol/L (ref 135–145)
Total Bilirubin: 1.1 mg/dL (ref 0.0–1.2)
Total Protein: 4.4 g/dL — ABNORMAL LOW (ref 6.5–8.1)

## 2024-06-11 LAB — CBC
HCT: 28.4 % — ABNORMAL LOW (ref 39.0–52.0)
Hemoglobin: 9.5 g/dL — ABNORMAL LOW (ref 13.0–17.0)
MCH: 32.8 pg (ref 26.0–34.0)
MCHC: 33.5 g/dL (ref 30.0–36.0)
MCV: 97.9 fL (ref 80.0–100.0)
Platelets: 46 K/uL — ABNORMAL LOW (ref 150–400)
RBC: 2.9 MIL/uL — ABNORMAL LOW (ref 4.22–5.81)
RDW: 14.9 % (ref 11.5–15.5)
WBC: 11.6 K/uL — ABNORMAL HIGH (ref 4.0–10.5)
nRBC: 0.3 % — ABNORMAL HIGH (ref 0.0–0.2)

## 2024-06-11 LAB — GLUCOSE, CAPILLARY
Glucose-Capillary: 87 mg/dL (ref 70–99)
Glucose-Capillary: 128 mg/dL — ABNORMAL HIGH (ref 70–99)
Glucose-Capillary: 136 mg/dL — ABNORMAL HIGH (ref 70–99)
Glucose-Capillary: 154 mg/dL — ABNORMAL HIGH (ref 70–99)

## 2024-06-11 LAB — MAGNESIUM: Magnesium: 1.7 mg/dL (ref 1.7–2.4)

## 2024-06-11 LAB — AMMONIA: Ammonia: <13 umol/L (ref 9–35)

## 2024-06-11 MED ORDER — MAGNESIUM SULFATE 2 GM/50ML IV SOLN: 2.0000 g | Freq: Once | INTRAVENOUS | Status: DC

## 2024-06-11 MED ORDER — FUROSEMIDE 10 MG/ML IJ SOLN
20.0000 mg | Freq: Once | INTRAMUSCULAR | Status: AC
  Administered 2024-06-11: 20 mg via INTRAVENOUS
  Filled 2024-06-11: qty 2

## 2024-06-11 MED ORDER — ARIPIPRAZOLE 2 MG PO TABS
1.0000 mg | ORAL_TABLET | Freq: Every day | ORAL | Status: DC
  Administered 2024-06-11 – 2024-06-15 (×5): 1 mg via ORAL
  Filled 2024-06-11 (×7): qty 1

## 2024-06-11 MED ORDER — ARIPIPRAZOLE 2 MG PO TABS: 1.0000 mg | ORAL_TABLET | Freq: Every day | ORAL | Status: DC

## 2024-06-11 MED ORDER — MAGNESIUM SULFATE 2 GM/50ML IV SOLN
2.0000 g | Freq: Once | INTRAVENOUS | Status: AC
  Administered 2024-06-11: 2 g via INTRAVENOUS
  Filled 2024-06-11: qty 50

## 2024-06-11 NOTE — Plan of Care (Signed)
  Problem: Clinical Measurements: Goal: Will remain free from infection Outcome: Progressing   Problem: Nutrition: Goal: Adequate nutrition will be maintained Outcome: Progressing   Problem: Pain Managment: Goal: General experience of comfort will improve and/or be controlled Outcome: Progressing   Problem: Safety: Goal: Ability to remain free from injury will improve Outcome: Progressing   Problem: Skin Integrity: Goal: Risk for impaired skin integrity will decrease Outcome: Progressing

## 2024-06-11 NOTE — Evaluation (Signed)
 Occupational Therapy Evaluation Patient Details Name: Tim [name] MRN: [id number] DOB: 1940-11-21 Today's Date: 06/11/2024   History of Present Illness   The pt is an 83 yo male admitted 12/4 with fever 103.7, found to have ESBL klebsiella ecoli bacteremia and UTI. Hospital stay complicated by hypotension requiring pressors and ICU, and acute metabolic encephalopathy. PMH includes: Cdiff 10/27-11/5 at [facility], HTN TAVR, hx of endocarditis, mitral valve lesion, AKI, CAD, PAF, CAD s/p PCI, complete heart block s/p PPM, HTN, a fib, DM, OSA, depression, AICD, glaucoma     Clinical Impressions Pt admitted based on above, and was seen based on problem list below. PTA pt was living with wife who assisted with ADLs, and reports only transferring from recliner to [facility] at baseline. Today pt is requiring set up  to total +2 assist for ADLs. Bed mobility was up to  total +2. Unable to achieve stand today d/t pt's c/o of pain, and poor command following. Pt with decreased cog, A & O x1, poor sequencing, reasoning, and problem solving. Based on today's performance recommending <3 hours of skilled rehab daily. OT will continue to follow acutely to maximize functional independence.    If plan is discharge home, recommend the following:   Two people to help with walking and/or transfers;Two people to help with bathing/dressing/bathroom;Assistance with cooking/housework;Assist for transportation     Functional Status Assessment   Patient has had a recent decline in their functional status and demonstrates the ability to make significant improvements in function in a reasonable and predictable amount of time.     Equipment Recommendations   Other (comment) (Defer to next venue)      Precautions/Restrictions   Precautions Precautions: Fall Recall of Precautions/Restrictions: Impaired Restrictions Weight Bearing Restrictions Per Provider Order: No     Mobility Bed Mobility Overal bed  mobility: Needs Assistance Bed Mobility: Supine to Sit, Sit to Supine, Rolling Rolling: Total assist, +2 for physical assistance, +2 for safety/equipment   Supine to sit: Mod assist, +2 for physical assistance, +2 for safety/equipment, HOB elevated, Used rails Sit to supine: Total assist, +2 for physical assistance, +2 for safety/equipment   General bed mobility comments: Pt required step-by-step cues to move bil legs EOB.  Mod +2 for trunk assistance. Total +2 to return to supine. Pt with poor command following to assist with rolling for pericare Total +2    Transfers Overall transfer level: Needs assistance Equipment used: 2 person hand held assist Transfers: Sit to/from Stand Sit to Stand: Total assist, +2 physical assistance, +2 safety/equipment, From elevated surface           General transfer comment: from elevated bed height, and 2 person [facility] assist. Pt not following commands or attempting to assist therapist with stand      Balance Overall balance assessment: Needs assistance Sitting-balance support: No upper extremity supported, Feet supported Sitting balance-Leahy Scale: Poor Sitting balance - Comments: Poor trunk and neck control, as fatigued increase trunk flex increased             ADL either performed or assessed with clinical judgement   ADL Overall ADL's : Needs assistance/impaired Eating/Feeding: Set up;Sitting   Grooming: Set up;Sitting   Upper Body Bathing: Set up;Sitting   Lower Body Bathing: Maximal assistance   Upper Body Dressing : Set up;Sitting   Lower Body Dressing: Maximal assistance       Toileting- Clothing Manipulation and Hygiene: Total assistance;+2 for physical assistance;+2 for safety/equipment;Bed level Toileting - Clothing Manipulation Details (indicate  cue type and reason): Incontinent.       General ADL Comments: Pt limited by pain with mobility, decreased strength, and activity tolerance     Vision Baseline  Vision/History: 0 No visual deficits Vision Assessment?: No apparent visual deficits            Pertinent Vitals/Pain Pain Assessment Pain Assessment: Faces Faces Pain Scale: Hurts whole lot Pain Location: L heel, back, generalized Pain Descriptors / Indicators: Discomfort, Grimacing, Crying, Moaning, Guarding Pain Intervention(s): Limited activity within patient's tolerance     Extremity/Trunk Assessment Upper Extremity Assessment Upper Extremity Assessment: Generalized weakness   Lower Extremity Assessment Lower Extremity Assessment: Defer to PT evaluation RLE Deficits / Details: MMT score of 3+ quads; hx of neuropathy per pt LLE Deficits / Details: hx of transmet amputation; MMT score of 3+ quads; hx of neuropathy per pt   Cervical / Trunk Assessment Cervical / Trunk Assessment: Kyphotic   Communication Communication Communication: Impaired Factors Affecting Communication: Hearing impaired;Reduced clarity of speech   Cognition Arousal: Alert Behavior During Therapy: Flat affect, Anxious Cognition: Cognition impaired   Orientation impairments: Place, Situation Awareness: Online awareness impaired   Attention impairment (select first level of impairment): Sustained attention Executive functioning impairment (select all impairments): Problem solving, Reasoning, Sequencing, Organization OT - Cognition Comments: Pt aware of hospitalization, but reporting he is in [place] . Unaware of time or reason for admission. Not following simple commands   Following commands: Impaired Following commands impaired: Follows one step commands inconsistently, Follows one step commands with increased time     Cueing  General Comments   Cueing Techniques: Verbal cues;Tactile cues  VSS on RA           Home Living Family/patient expects to be discharged to:: Private residence Living Arrangements: Spouse/significant other Available Help at Discharge: Family Type of Home:  House Home Access: Stairs to enter [employer] of Steps: 4 (has stair lift) Entrance Stairs-Rails: Right;Left Home Layout: One level     Bathroom Shower/Tub: Sponge bathes at baseline   Bathroom Toilet: Handicapped height Bathroom Accessibility: Yes How Accessible: Accessible via walker Home Equipment: Rolling Walker (2 wheels);Rollator (4 wheels);BSC/3in1   Additional Comments: Admission info retrieved from previous admission, pt unreliable historian.      Prior Functioning/Environment Prior Level of Function : Needs assist             Mobility Comments: Reports transfer to [name] with RW, pt reports he has not been ambulating recently. Per PT notes in November 2025, pt is mod I with RW ambulating up to ~50 ft at baseline ADLs Comments: Wife assists with ADLs. Sponge bathe at baseline    OT Problem List: Decreased strength;Decreased range of motion;Decreased activity tolerance;Impaired balance (sitting and/or standing);Decreased safety awareness;Decreased knowledge of use of DME or AE;Decreased knowledge of precautions;Cardiopulmonary status limiting activity;Decreased cognition   OT Treatment/Interventions: Self-care/ADL training;Therapeutic exercise;Energy conservation;DME and/or AE instruction;Therapeutic activities;Patient/family education;Balance training;Cognitive remediation/compensation      OT Goals(Current goals can be found in the care plan section)   Acute Rehab OT Goals Patient Stated Goal: To lay down OT Goal Formulation: With patient Time For Goal Achievement: 06/25/24 Potential to Achieve Goals: Fair   OT Frequency:  Min 2X/week    Co-evaluation PT/OT/SLP Co-Evaluation/Treatment: Yes Reason for Co-Treatment: Necessary to address cognition/behavior during functional activity;For patient/therapist safety;To address functional/ADL transfers PT goals addressed during session: Mobility/safety with mobility;Balance OT goals addressed during  session: ADL's and self-care      AM-PAC OT 6 Clicks Daily Activity  Outcome Measure Help from another person eating meals?: None Help from another person taking care of personal grooming?: A Little Help from another person toileting, which includes using toliet, bedpan, or urinal?: Total Help from another person bathing (including washing, rinsing, drying)?: A Lot Help from another person to put on and taking off regular upper body clothing?: A Little Help from another person to put on and taking off regular lower body clothing?: A Lot 6 Click Score: 15   End of Session Equipment Utilized During Treatment: Gait belt;Rolling walker (2 wheels) Nurse Communication: Mobility status;Other (comment) (Pt on Bedpan)  Activity Tolerance: Patient limited by pain Patient left: in bed;with call bell/phone within reach (Pt on bed pan, NT notified)  OT Visit Diagnosis: Unsteadiness on feet (R26.81);Other abnormalities of gait and mobility (R26.89);Muscle weakness (generalized) (M62.81)                Time: 8960-8879 OT Time Calculation (min): 41 min Charges:  OT General Charges $OT Visit: 1 Visit OT Evaluation $OT Eval Moderate Complexity: 1 Mod  Fynlee Rowlands C, OT  Acute Rehabilitation Services Office [phone] Secure chat preferred   Adrianne GORMAN Savers 06/11/2024, 1:45 PM

## 2024-06-11 NOTE — Consult Note (Signed)
 WOC Nurse Consult Note:  WOC consult performed remotely utilizing imaging and chart review Reason for Consult: new stage 3 to left groin  Wound type: Full thickness wound to L groin, suspect moisture related, inconsistent with pressure Pressure Injury POA: NA- not pressure Measurement: see nursing flow sheets Wound bed: 100 % covered with yellow slough Drainage (amount, consistency, odor) see nursing flow sheets Periwound: intact Dressing procedure/placement/frequency:  Cleanse wound with Vashe (lawson # [id number]) allow to air dry.  Apply Aquacel AG (lawson [phone]) and cover with silicone foam dressing.  Change daily.    WOC team will not follow patient at this time, please re consult if new needs arise.  Thank you,  Doyal Polite, MSN, RN, [facility] WOC Team [phone] (Available Mon-Fri 0700-1500)

## 2024-06-11 NOTE — Progress Notes (Addendum)
 Informed to 4W nursing staffs for Caude catheter insertion, supplies at bedside.   1527; Requested to 4W staffs for Cude catheter placement.   1658 called 4W staffs to request above procedure, MD also notified.

## 2024-06-11 NOTE — Evaluation (Signed)
 Physical Therapy Evaluation Patient Details Name: Tim [name] MRN: [id number] DOB: [birth year] Today's Date: 06/11/2024  History of Present Illness  The pt is an 83 yo male admitted 12/4 with fever 103.7, found to have ESBL klebsiella ecoli bacteremia and UTI. Hospital stay complicated by hypotension requiring pressors and ICU, and acute metabolic encephalopathy. PMH includes: Cdiff 10/27-11/5 at [facility], HTN TAVR, hx of endocarditis, mitral valve lesion, AKI, CAD, PAF, CAD s/p PCI, complete heart block s/p PPM, HTN, a fib, DM, OSA, depression, AICD, glaucoma   Clinical Impression  Pt presents with condition above and deficits mentioned below, see PT Problem List. Currently, the pt is disoriented to location and situation and is a poor historian. Per chart review, pt was mod I using a RW while ambulating up to ~50 ft at a time prior to November 2025. The pt reports he has only been able to perform transfers lately though. Per chart, he lives with his wife in a 1-level house with a stair lift to enter. Currently, the pt displays deficits in cognition, balance, power, activity tolerance, and gross strength. He is requiring modAx2 to transition supine to sit but then total assist x2 to return to supine and roll bil due pt fatiguing quickly. He did not display any initiation to attempt transfers si to stand or lateral scoot along EOB, and thus was unsuccessful with total assist x2. He reported anxiety with transfer attempts. The pt could benefit from inpatient rehab, < 3 hours/day, to maximize his return to baseline and reduce caregiver burden. Will continue to follow acutely.      If plan is discharge home, recommend the following: Two people to help with walking and/or transfers;Two people to help with bathing/dressing/bathroom;Assistance with cooking/housework;Assistance with feeding;Direct supervision/assist for medications management;Direct supervision/assist for financial management;Assist for  transportation;Help with stairs or ramp for entrance;Supervision due to cognitive status   Can travel by private vehicle   No    Equipment Recommendations Wheelchair (measurements PT);Wheelchair cushion (measurements PT);Hospital bed;Hoyer lift  Recommendations for Other Services       Functional Status Assessment Patient has had a recent decline in their functional status and demonstrates the ability to make significant improvements in function in a reasonable and predictable amount of time.     Precautions / Restrictions Precautions Precautions: Fall Restrictions Weight Bearing Restrictions Per Provider Order: No      Mobility  Bed Mobility Overal bed mobility: Needs Assistance Bed Mobility: Supine to Sit, Sit to Supine, Rolling Rolling: Total assist, +2 for physical assistance, +2 for safety/equipment   Supine to sit: Mod assist, +2 for physical assistance, +2 for safety/equipment, HOB elevated, Used rails Sit to supine: Total assist, +2 for physical assistance, +2 for safety/equipment   General bed mobility comments: Pt required step-by-step cues to move bil legs one at a time off L EOB and to bring R UE towards therapist on his L to pull up to sit L EOB, modAx2 for trunk and hip pivot to side of bed. Total assist x2 to manage trunk and legs back to supine as pt was not following commands well after fatiguing sitting EOB. Poor initiation to roll for linen change, pericare, and bed pan placement end of session, needing total assistx2 to complete.    Transfers Overall transfer level: Needs assistance Equipment used: 2 person hand held assist Transfers: Sit to/from Stand Sit to Stand: Total assist, +2 physical assistance, +2 safety/equipment, From elevated surface           General  transfer comment: EOB elevated, knees blocked, and cues provided for pt to hold onto therapists to pull up to stand. Total assist x2 provided to attempt to stand from EOB several times but no  initiation noted by pt despite rocking pt's trunk to facilitate standing. Unable to clear buttocks despite attempts    Ambulation/Gait               General Gait Details: unable at this time  Stairs            Wheelchair Mobility     Tilt Bed    Modified Rankin (Stroke Patients Only) Modified Rankin (Stroke Patients Only) Pre-Morbid Rankin Score: Moderately severe disability Modified Rankin: Severe disability     Balance Overall balance assessment: Needs assistance Sitting-balance support: No upper extremity supported, Feet supported Sitting balance-Leahy Scale: Poor Sitting balance - Comments: Pt requiring minA for static sitting balance, progressively letting his head/neck flex and drop more as he fatigued. Poor ability to hold his head up when cued.       Standing balance comment: unable despite total assist x2 being provided                             Pertinent Vitals/Pain Pain Assessment Pain Assessment: Faces Faces Pain Scale: Hurts whole lot Pain Location: L heel, back, generalized Pain Descriptors / Indicators: Discomfort, Grimacing, Crying, Moaning, Guarding Pain Intervention(s): Limited activity within patient's tolerance, Monitored during session, Repositioned    Home Living Family/patient expects to be discharged to:: Private residence Living Arrangements: Spouse/significant other Available Help at Discharge: Family Type of Home: House Home Access: Stairs to enter Entrance Stairs-Rails: Doctor, [employer] of Steps: 4 (has stair lift)   Home Layout: One level Home Equipment: [employer] (2 wheels);Rollator (4 wheels);BSC/3in1 Additional Comments: Admission info retrieved from previous admission, pt unreliable historian.    Prior Function Prior Level of Function : Needs assist             Mobility Comments: Reports transfer to [facility] with RW, pt reports he has not been ambulating recently. Per PT notes in  November 2025, pt is mod I with RW ambulating up to ~50 ft at baseline ADLs Comments: Wife assists with ADLs. Sponge bathe at baseline     Extremity/Trunk Assessment   Upper Extremity Assessment Upper Extremity Assessment: Defer to OT evaluation    Lower Extremity Assessment Lower Extremity Assessment: Generalized weakness;RLE deficits/detail;LLE deficits/detail RLE Deficits / Details: MMT score of 3+ quads; hx of neuropathy per pt LLE Deficits / Details: hx of transmet amputation; MMT score of 3+ quads; hx of neuropathy per pt    Cervical / Trunk Assessment Cervical / Trunk Assessment: Kyphotic  Communication   Communication Communication: Impaired Factors Affecting Communication: Hearing impaired;Reduced clarity of speech    Cognition Arousal: Alert Behavior During Therapy: Flat affect, Anxious   PT - Cognitive impairments: No family/caregiver present to determine baseline, Orientation, Awareness, Memory, Attention, Initiation, Sequencing, Problem solving, Safety/Judgement   Orientation impairments: Place, Situation                   PT - Cognition Comments: Pt oriented to self and initially stated the date was December 1525 but eventually corrected to 2025. he knew Christmas had not passed yet. Pt unaware of situation and thought he was in [place]  city. Pt has decreased awareness of his deficits or awareness to hold his head up when sitting. Pt anxious in regards  to standing, displaying no initiation to do so despite cues and encouragement. Delayed processing noted. Needs frequent redirecting and reminders of current task due to forgetting what he was doing or saying quickly. Following commands: Impaired Following commands impaired: Follows one step commands inconsistently, Follows one step commands with increased time     Cueing Cueing Techniques: Verbal cues, Tactile cues     General Comments General comments (skin integrity, edema, etc.): VSS on RA     Exercises     Assessment/Plan    PT Assessment Patient needs continued PT services  PT Problem List Decreased strength;Decreased activity tolerance;Decreased balance;Decreased mobility;Decreased cognition       PT Treatment Interventions DME instruction;Gait training;Functional mobility training;Therapeutic activities;Therapeutic exercise;Balance training;Neuromuscular re-education;Cognitive remediation;Patient/family education;Wheelchair mobility training;Stair training    PT Goals (Current goals can be found in the Care Plan section)  Acute Rehab PT Goals Patient Stated Goal: to get better PT Goal Formulation: With patient Time For Goal Achievement: 06/25/24 Potential to Achieve Goals: Fair    Frequency Min 2X/week     Co-evaluation   Reason for Co-Treatment: Necessary to address cognition/behavior during functional activity;For patient/therapist safety;To address functional/ADL transfers PT goals addressed during session: Mobility/safety with mobility;Balance OT goals addressed during session: ADL's and self-care       AM-PAC PT 6 Clicks Mobility  Outcome Measure Help needed turning from your back to your side while in a flat bed without using bedrails?: Total Help needed moving from lying on your back to sitting on the side of a flat bed without using bedrails?: Total Help needed moving to and from a bed to a chair (including a wheelchair)?: Total Help needed standing up from a chair using your arms (e.g., wheelchair or bedside chair)?: Total Help needed to walk in hospital room?: Total Help needed climbing 3-5 steps with a railing? : Total 6 Click Score: 6    End of Session Equipment Utilized During Treatment: Gait belt Activity Tolerance: Patient limited by fatigue Patient left: in bed;with call bell/phone within reach;with bed alarm set;Other (comment) (on bed pan) Nurse Communication: Mobility status;Other (comment) (pt currently on bed pan) PT Visit  Diagnosis: Muscle weakness (generalized) (M62.81);Difficulty in walking, not elsewhere classified (R26.2)    Time: 8960-8879 PT Time Calculation (min) (ACUTE ONLY): 41 min   Charges:   PT Evaluation $PT Eval Moderate Complexity: 1 Mod PT Treatments $Therapeutic Activity: 8-22 mins PT General Charges $$ ACUTE PT VISIT: 1 Visit         Theo Ferretti, PT, DPT Acute Rehabilitation Services  Office: [phone]   Theo CHRISTELLA Ferretti 06/11/2024, 1:10 PM

## 2024-06-11 NOTE — Progress Notes (Signed)
 [name] ADULT ICU REPLACEMENT PROTOCOL   The patient does apply for the [facility] Adult ICU Electrolyte Replacment Protocol based on the criteria listed below:   1.Exclusion criteria: TCTS, ECMO, Dialysis, and Myasthenia Gravis patients 2. Is GFR >/= 30 ml/min? Yes.    Patient's GFR today is >60 3. Is SCr </= 2? Yes.   Patient's SCr is 0.81 mg/dL 4. Did SCr increase >/= 0.5 in 24 hours? No. 5.Pt's weight >40kg  Yes.   6. Abnormal electrolyte(s): mag 1.7  7. Electrolytes replaced per protocol 8.  Call MD STAT for K+ </= 2.5, Phos </= 1, or Mag </= 1 Physician:  protocol  Claretta JINNY Sharps 06/11/2024 6:09 AM

## 2024-06-11 NOTE — Progress Notes (Signed)
 Initial Nutrition Assessment  DOCUMENTATION CODES:   Non-severe (moderate) malnutrition in context of chronic illness  INTERVENTION:  Continue current diet as ordered Order with assistance Increase Ensure Plus High Protein po TID, each supplement provides 350 kcal and 20 grams of protein. Magic cup TID with meals, each supplement provides 290 kcal and 9 grams of protein MVI with minerals daily Consider Cortrak placement if continues with poor oral intake and episodes of hypoglycemia  NUTRITION DIAGNOSIS:   Moderate Malnutrition related to chronic illness (CAD, AS, endocarditis, HF, COPD) as evidenced by moderate fat depletion, moderate muscle depletion, edema.  GOAL:   Patient will meet greater than or equal to 90% of their needs  MONITOR:   PO intake, Supplement acceptance, Labs, Weight trends, Skin  REASON FOR ASSESSMENT:   Consult Assessment of nutrition requirement/status  ASSESSMENT:   Pt admitted with septic shock secondary to bacteremia and UTI from chronic foley. 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 Recent admission 10/9-10/17 with mixed hypovolemic/septic shock and following admission to [name] 10/29-11/1 with c diff.  Spoke with pt at bedside. His wife initially present though stepped out upon arrival of PT/OT. Nutrition history is limited as pt having difficulty with memory and recall. Of note on admission his wife had reported pt with poor oral intake though time frame not recorded.   Patient states that he has an ok appetite but reports that the food does not compare to his mom's cooking that he has grown accustomed to. He is working on breakfast meal at time of visit but only took a couple bites of french toast as he does not enjoy this. He had taken a couple bites of apple sauce at time of visit.   Pt with Ensure on table and reports that he enjoys these  and is willing to continue these supplements to augment intake.   Of note, meal completions documented to be 0% x 3 meals (12/9-12/10). Per RN documentation pt did consume ensure, will escalate oral nutrition supplement to further optimize PO intake.   Of note, pt has been hypoglycemic d/t poor oral intake.  On 12/9: 102, 106, 40, 30, 96, 57, 108  Admit weight: 115 kg Current weight:  120 kg  Medications: SSI 0-6 units QID, IV abx  Labs:  Cr 0.46 CBG's 87-154 x24 hours  NUTRITION - FOCUSED PHYSICAL EXAM:  Flowsheet Row Most Recent Value  Orbital Region Severe depletion  Upper Arm Region No depletion  Thoracic and Lumbar Region No depletion  Buccal Region Moderate depletion  Temple Region Moderate depletion  Clavicle Bone Region Moderate depletion  Clavicle and Acromion Bone Region No depletion  Scapular Bone Region Unable to assess  Dorsal Hand Unable to assess  [BUE]  Patellar Region Unable to assess  Anterior Thigh Region Unable to assess  [moderate pitting edema]  Posterior Calf Region Unable to assess  [prevalon boots]  Edema (RD Assessment) Moderate  Hair Reviewed  Eyes Reviewed  Mouth Reviewed  Skin Reviewed  Nails Reviewed    Diet Order:   Diet Order             DIET DYS 3 Room service appropriate? Yes with Assist; Fluid consistency: Thin  Diet effective now                   EDUCATION NEEDS:   Not appropriate for education at this time  Skin:  Skin Assessment:  Skin Integrity Issues: Skin Integrity Issues:: Stage III, Other (Comment) Stage III: sacrum, left thigh Other: skin tear to left arm, bilateral pretibial, laceration to lower arm  Last BM:  12/7  Height:   Ht Readings from Last 1 Encounters:  06/05/24 6' 4 (1.93 m)    Weight:   Wt Readings from Last 1 Encounters:  06/12/24 120 kg    Ideal Body Weight:  91.8 kg  BMI:  Body mass index is 32.2 kg/m.  Estimated Nutritional Needs:   Kcal:  2400-2600  Protein:   140-150g  Fluid:  >/=2L  Tim [name], RDN, LDN Clinical Nutrition See [facility] for contact information.

## 2024-06-11 NOTE — Consult Note (Signed)
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 Place and Person)  Thought Content:WDL  History of Schizophrenia/Schizoaffective disorder:No data recorded Duration of Psychotic Symptoms:No data recorded Hallucinations:Hallucinations: None  Ideas of Reference:None  Suicidal Thoughts:Suicidal Thoughts: No  Homicidal Thoughts:Homicidal Thoughts: No   Sensorium  Memory:Immediate Fair  Judgment:Fair  Insight:Fair   Executive Functions  Concentration:Good  Attention Span:Fair  Recall:Fair  Fund of Knowledge:Good  Language:Good   Psychomotor Activity  Psychomotor Activity:Psychomotor Activity: Normal   Assets  Assets:Communication Skills; Desire for Improvement; Housing; Social Support   Sleep  Sleep:Sleep: Fair  Physical Exam:  Physical Exam Constitutional:      General: He is not in acute distress.    Appearance: He is not diaphoretic.  Pulmonary:     Effort: Pulmonary effort is normal.  Neurological:     Mental Status: He is alert.    Review of Systems  Psychiatric/Behavioral:  Negative for depression, hallucinations and suicidal ideas. The patient is not nervous/anxious and does not have insomnia.    Blood pressure 111/81, pulse 95, temperature (!) 97.3 F (36.3 C), temperature source  Oral, resp. rate 17, height 6' 4 (1.93 m), weight 119.5 kg, SpO2 96%. Body mass index is 32.07 kg/m.

## 2024-06-11 NOTE — Hospital Course (Addendum)
 Tim [name] is a 83 y.o. 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 Patient has history of enterococcal infection of the AICD lead that is being treated with chronic amoxicillin .   Recent admission at [name] on 10/9-10/17:mixed hypovolemic/septic shock. At [name] on 10/29-11/1:Tim [name] treated w/ vanc. Wife states he has not had diarrhea symptoms and off po vanc. Did have diarrhea on 12/3 and so did wife. Wife also states he has poor po intake.  On 12/4 overnight patient had high fever. EMS called found temp of 103.7. In [name] 99.3 F and wbc 6.7. LA 5. Chronic foley in place w/ hematuria which was replaced. UA inconclusive. INR 3.7. CXR unremarkable. Cultures obtained and started on broad spectrum abx. Despite fluids remains hypotensive started on levoPHED  12/5 admit to [name] 12/6 spiked fever with Tmax of 100.7, blood culture is growing ESBL gram-negative rods with E. coli/Klebsiella.  Vasopressor requirement improving, Levophed  at 13, vasopressin  at 0.03 unit.  Foley catheter was exchanged by urology, no more hematuria 12/8 norepinephrine  requirements coming down.  Platelet count stabilized.  LFTs climbing compared to last 72 hours 12/10: Transferred to [name] service 12-11/12 having episodes of vomiting x-ray shows ileus versus gastroparesis> has resolved and tolerating p.o. diet Seen by cardiology 12/16 -palliative care following, patient opting for comfort measures 12/17 After extensive palliative meeting 12/18 patient decided to stop all meds except for comfort meds, and also pacemaker to be turned off  Subjective: Seen and examined Wife at the bedside.  [name] awake complains of pain on the low back and also left leg Palliative just saw and adjusting  pain medication Remains on medication including diuretics and comforting meds and as needed opiates Ativan  for comfort  Assessment and plan:  Goals of care Deconditioning/debility Recurrent hospitalization Poor oral intake Patient is DNR limited: he has had very poor intake very deconditioned with complex medical comorbidities Created space and opportunity for patient to explore thoughts, feelings and fears regarding current medical situation-and also provided therapeutic listening -wife is very overwhelmed with his medical condition and his overall decline-and also anxious about his rehab. Again he is at high risk for readmission decompensation overall prognosis guarded.  He had a long conversation with cardiology  At this time patient is opting for palliative care approach/hospice,-hospice team evaluated did not meet criteria for inpatient hospice at this time Patient wife unable to care for him at home.  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 Blood culture12/4-5: E. coli and Klebsiella - repeat blood culture 12/9 NGTD  Urine culture- E. coli positive and hematuria Foley changed by urology 12/11 [name] Significantly leukocytosis 56k - which is resolved, vss and afebrile.  Completed meropenem   and now on home Amoxicillin  12/15>> STOPPED 12/18 FOR COMFORT CARE S/P PO vancomycin  for Tim [name] prophylaxis  Paroxysmal A-fib with RVR: H/o CHB, has PPM History of hypertension-his Coreg amlodipine losartan  has been on hold since  October AS S/P TAVR-Last  TEE in October 2025 showed ejection fraction 65 to 70%  Anasarca: Rate stable.His Eliquis  has been on hold since October ( he had gross hematuira and eliquis  was held that time)- This admit home RN exchanged Foley and he had gross hematuria after that and admitted to [facility] with septic shock- 12/15 Discussed w/ Dr Renda from uro-could consider Eliquis  trial, seen by cardiology 12/16 at this time recommended to hold Eliquis  given his recurrent hematuria, still has Foley in place  He has been tolerating DVT prophylaxis. Needs PPM replacement Feb 2026-patient reports she has not been able to get back to her cardiologist> seen by cardiology team here, he will need to follow-up with Dr. Cornelia at [facility] unlikely is candidate for aggressive measures-He does not appear to be a candidate for valve or structural team procedure at this point given his recent sepsis. Beta-blocker antihypertensive remains on hold, Now on torsemide  only.  VomitingX2 on 12/12 Possible ileus versus gastroparesis on imaging: OFF Reglan .Tolerating p.o.  Hypokalemia Hypomagnesemia: Resolved  Thrombocytopenia: likely 2/2 sepsis and antibiotics, resolved   Anemia of chronic illness Stable.  Monitor and transfuse for Hb <7   Hyperglycemia Stable  Acute metabolic encephalopathy: Suspecting encephalopathy and delirium in the setting of sepsis and ICU admission.  Mental status has improved but remains deconditioned and weak  Chronic back pain on chronic opiates: Patient reports no surgeon could fix his back, PTA on oxycodone  20 mg prn, gabapentin  800.  Now on comfort measures with opiates for pain management as #1  H/o C diff Continue suppressive vancomycin  while on meropenem .has had intermittent constipation but now loose stool continue symptomatic management .   Elevated transaminases: Likely in the setting of shock, resolving.     Adjustment disorder versus MDD/anxiety/paranoid  thoughts Suicidal ideation: Psych has been-safety sitter has been discontinued, no SI.  Continue Remeron  bedtime rest OF THE MEDS STOPPED   ? Trigeminy in monitor: Has paced rhythm, asymptomatic   Class I Obesity w/ Body mass index is 31.72 kg/m.: Will benefit with PCP follow-up, weight loss,healthy lifestyle and outpatient sleep eval if not done.  Left upper extremity single-lumen  midline 12/9 Foley in place   Mobility: PT Orders: Active PT Follow up Rec: Skilled Nursing-Short Term Rehab (<3 Hours/Day)06/18/2024 1500   DVT prophylaxis:  lovenox  Code Status:   Code Status: Do not attempt resuscitation (DNR) - Comfort care Family Communication: plan of care discussed with patient/wife at bedside updated Patient status is: Remains hospitalized because of severity of illness Level of care: Med-Surg   Dispo: The patient is from: HOME            Anticipated disposition: tbd PLAN FOR HOSPICE  Objective: Vitals last 24 hrs: Vitals:   06/19/24 1130 06/19/24 1726 06/19/24 1951 06/20/24 0821  BP: 118/63 120/63 (!) 122/59 132/61  Pulse: 85 89 89 91  Resp: 19 19 19 18   Temp: 98.7 F (37.1 C) 98.1 F (36.7 C) 98.5 F (36.9 C) 98.4 F (36.9 C)  TempSrc: Axillary  Oral Oral  SpO2: 100% 97% 95% 92%  Weight:      Height:       Physical Examination: General exam: AOOx3 obese and ill-appearing HEENT:Oral mucosa moist, Ear/Nose WNL grossly Respiratory system: Clear to auscultation bilaterally diminished at bases, no use of accessory muscle Cardiovascular system: S1 & S2 +, No JVD. Gastrointestinal system: Abdomen soft, very obese,NT BS+ Nervous System: Alert, awake, moving all extremities,and following commands. Extremities: extremities warm, leg edema trace, left TMA old healed. Skin:  Warm, no rashes MSK: Normal muscle bulk,tone, power   Medications reviewed:  Scheduled Meds:  Chlorhexidine  Gluconate Cloth  6 each Topical Daily   collagenase   1 Application Topical Daily    famotidine   20 mg Oral BID   feeding supplement  237 mL Oral TID BM   gabapentin   100 mg Oral TID   Gerhardt's butt cream   Topical BID   lidocaine   1 patch Transdermal Q24H   mirtazapine   15 mg Oral QHS   sodium chloride  flush  10-40 mL Intracatheter Q12H   torsemide   20 mg Oral Daily   Continuous Infusions:    Diet: Diet Order             DIET SOFT Room service appropriate? Yes with Assist; Fluid consistency: Thin  Diet effective now                 Sick patient will resume

## 2024-06-11 NOTE — Progress Notes (Signed)
 PROGRESS NOTE ODAY [name]  FMW:[id number] DOB: 1940-09-13 DOA: 06/05/2024 PCP: [name], MD  Brief Narrative/Hospital Course: Tim [name] is a 83 y.o. 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 Patient has history of enterococcal infection of the AICD lead that is being treated with chronic amoxicillin .   Recent admission at [name] on 10/9-10/17:mixed hypovolemic/septic shock. At [facility] on 10/29-11/1:[name] treated w/ vanc. Wife states he has not had diarrhea symptoms and off po vanc. Did have diarrhea on 12/3 and so did wife. Wife also states he has poor po intake.  On 12/4 overnight patient had high fever. EMS called found temp of 103.7. In [name] 99.3 F and wbc 6.7. LA 5. Chronic foley in place w/ hematuria which was replaced. UA inconclusive. INR 3.7. CXR unremarkable. Cultures obtained and started on broad spectrum abx. Despite fluids remains hypotensive started on levoPHED  12/5 admit to [facility] 12/6 spiked fever with Tmax of 100.7, blood culture is growing ESBL gram-negative rods with E. coli/Klebsiella.  Vasopressor requirement improving, Levophed  at 13, vasopressin  at 0.03 unit.  Foley catheter was exchanged by urology, no more hematuria 12/8 norepinephrine  requirements coming down.  Platelet count stabilized.  LFTs climbing compared to last 72 hours 12/10: Transferred to [facility] service  Subjective: Seen and examined Wife at the bedside.  [name] awake oriented drinking Ensure Denies nausea vomiting chest pain shortness of breath.  He is on room air Appears obese but debilitated weak and frail No BM for several days but no abdominal pain or discomfort Overnight on room air, afebrile, VSS, Labs reviewed AST ALT continues to downtrend  normal TV ammonia less than 13 stable electrolytes CBC-WBC 56> 46> 24 and now at 11, hemoglobin stable 9>9.5 platelet 32>35> 46  Assessment and plan:  Septic shock-shock resolved K pneumonia E coli bacteremia UTI-with history of BPH and chronic Foley Leukemoid reaction with leukocytosis: Admitted with septic shock with multiple infection.  Shock resolved.  Now on low-dose midodrine  for support-SBP goal above 90 holding antihypertensive.  Blood culture from admission 12/4-5: E. coli and Klebsiella positive repeat blood culture 12/9 NGTD  Urine culture on admission E. coli positive Foley changed by urology.  Patient vitally stable afebrile and leukocytosis significantly improvING Continue meropenem  transitioning to amoxicillin  to 12/15 Continue contact precaution for ESBL monitor CBC.   H/o C diff Continue suppressive vancomycin  while on meropenem .  No diarrhea but constipation   Elevated transaminases: Likely in the setting of shock, resolving.    Aortic stenosis status post TAVR. Last TEE in October 2025 showed ejection fraction 65 to 70%  Anasarca:Volume overload with net balance Improving Net IO Since Admission: -632.9 mL [06/11/24 0837] Monitor fluid status Holding home meds for now, received Lasix  in ICU good output 2940cc Current weight 263 on admission 253lb.  Will do 1 more dose of Lasix   Paroxysmal A-fib with RVR: H/o CHB, has PPM: Rate currently controlled.  Holding home Coreg and aspirin  in the setting of thrombocytopenia hypertension Needs PPM replacement Feb 2026  Thrombocytopenia:  likely 2/2 sepsis and antibiotics, and proving continue to trend holding DVT prophylaxis for now   Anemia of chronic illness Monitor and transfuse for Hb <7   Hyperglycemia Cont SSI PRN   Goals of care Deconditioning/debility Recurrent hospitalization question rehab potential, continue  PT OT aggressively.  Patient is DNR limited.   Enterococcal prosthetic mitral valve endocarditis  and pacemaker infection on chronic antibiotic with amoxicillin : Will resume amoxicillin  (was on life long suppressive rx)  after completion of his meropenem   Acute metabolic encephalopathy: Suspecting encephalopathy and delirium in the setting of sepsis and ICU admission.  Continue delirium precaution frequent reorientation PT OT and mobilize    Suicidal ideation: Psych has been consulted continue Cymbalta , Atarax . Safety sitter has been discontinued    Urinary retention long-term would consider if he is a candidate for suprapubic catheter. change CBI to coude PER COUDE TEAM  Class I Obesity w/ Body mass index is 32.07 kg/m.: Will benefit with PCP follow-up, weight loss,healthy lifestyle and outpatient sleep eval if not done.  Left upper extremity single-lumen  midline 12/9 Foley in place due to exchange 12/10  Mobility: PT Orders: Active PT Follow up Rec:    DVT prophylaxis: SCDs Start: 06/06/24 0316 Code Status:   Code Status: Limited: Do not attempt resuscitation (DNR) -DNR-LIMITED -Do Not Intubate/DNI  Family Communication: plan of care discussed with patient/wife at bedside. Patient status is: Remains hospitalized because of severity of illness Level of care: Progressive   Dispo: The patient is from: HOME            Anticipated disposition: TBD Objective: Vitals last 24 hrs: Vitals:   06/11/24 0500 06/11/24 0600 06/11/24 0703 06/11/24 0757  BP: 130/60 128/63 135/66 (!) 143/60  Pulse: 86 86 94 98  Resp: 13 14 18 17   Temp:   98.5 F (36.9 C) 98.3 F (36.8 C)  TempSrc:   Axillary Oral  SpO2: 96% 97% 98% 96%  Weight: 119.5 kg     Height:        Physical Examination: General exam: alert awake, oriented, older than stated age HEENT:Oral mucosa moist, Ear/Nose WNL grossly Respiratory system: Bilaterally diminished BS,no use of accessory muscle Cardiovascular system: S1 & S2 +, No JVD. Gastrointestinal system: Abdomen soft,NT,ND, BS+ Nervous System: Alert, awake,  moving all extremities,and following commands. Extremities: extremities warm, leg edema + Skin: Warm, no rashes MSK: Normal muscle bulk,tone, power   Medications reviewed:  Scheduled Meds:  [START ON 06/16/2024] amoxicillin   1,000 mg Oral BID   arformoterol   15 mcg Nebulization Q12H   budesonide  (PULMICORT ) nebulizer solution  0.5 mg Nebulization BID   Chlorhexidine  Gluconate Cloth  6 each Topical Daily   collagenase   1 Application Topical Daily   DULoxetine   20 mg Oral Daily   famotidine   20 mg Oral BID   feeding supplement  237 mL Oral BID BM   gabapentin   100 mg Oral TID   Gerhardt's butt cream   Topical BID   insulin  aspart  0-6 Units Subcutaneous TID AC & HS   lidocaine   1 patch Transdermal Q24H   lidocaine   1 Application Urethral Once   midodrine   5 mg Oral TID WC   revefenacin   175 mcg Nebulization Daily   sodium chloride  flush  10-40 mL Intracatheter Q12H   vancomycin   125 mg Oral Daily   Continuous Infusions:  magnesium  sulfate bolus IVPB     meropenem  (MERREM ) IV Stopped (06/11/24 0137)   Diet: Diet Order             DIET DYS 3 Room service appropriate? Yes with Assist; Fluid consistency: Thin  Diet effective now                   Data Reviewed: I have  personally reviewed following labs and imaging studies ( see epic result tab) CBC: Recent Labs  Lab 06/06/24 0049 06/06/24 0111 06/07/24 0357 06/08/24 0435 06/08/24 1013 06/09/24 0420 06/10/24 0410 06/10/24 1150 06/11/24 0355  WBC 6.7   < > 46.8* 56.7*  --  46.1* 24.0*  --  11.6*  NEUTROABS 5.8  --   --   --   --   --   --   --   --   HGB 12.9*   < > 11.3* 10.5*  --  9.7* 9.0*  --  9.5*  HCT 39.1   < > 32.6* 30.4*  --  29.0* 26.3*  --  28.4*  MCV 98.5   < > 95.9 95.3  --  96.3 95.3  --  97.9  PLT 61*   < > 45* 43* 39* 39* 32* 35* 46*   < > = values in this interval not displayed.   CMP: Recent Labs  Lab 06/07/24 0357 06/08/24 0435 06/08/24 1650 06/09/24 0645 06/10/24 0410 06/11/24 0355   NA 133* 138 136 139 138 139  K 4.2 3.8 3.9 3.7 3.6 4.0  CL 104 106 104 104 105 105  CO2 18* 24 23 26 22 30   GLUCOSE 180* 156* 136* 111* 100* 90  BUN 23 27* 28* 25* 22 19  CREATININE 1.22 0.79 0.64 0.81 0.61 0.81  CALCIUM  7.8* 8.0* 7.9* 8.1* 7.8* 7.7*  MG 1.9 2.3  --  2.1 1.8 1.7   GFR: Estimated Creatinine Clearance: 95.9 mL/min (by C-G formula based on SCr of 0.81 mg/dL). Recent Labs  Lab 06/07/24 0357 06/08/24 0435 06/09/24 0645 06/10/24 0410 06/10/24 1150 06/11/24 0355  AST 274* 242* 523* 370*  --  202*  ALT 110* 125* 238* 256*  --  196*  ALKPHOS 81 103 198* 218*  --  212*  BILITOT 1.1 1.5* 1.5* 1.1 1.1 1.1  PROT 4.9* 5.1* 4.8* 4.5*  --  4.4*  ALBUMIN 2.1* 2.1* 1.9* 1.8*  --  1.8*   No results for input(s): LIPASE, AMYLASE in the last 168 hours.  Recent Labs  Lab 06/06/24 0835 06/11/24 0355  AMMONIA 37* <13   Coagulation Profile:  Recent Labs  Lab 06/06/24 0835 06/06/24 1210 06/07/24 0357 06/08/24 1013 06/10/24 1150  INR 2.7* 2.3* 2.3* 1.2 1.2   Unresulted Labs (From admission, onward)     Start     Ordered   06/12/24 0500  Basic metabolic panel with GFR  Tomorrow morning,   R       Question:  Specimen collection method  Answer:  Unit=Unit collect   06/11/24 0837   06/12/24 0500  CBC  Tomorrow morning,   R       Question:  Specimen collection method  Answer:  Unit=Unit collect   06/11/24 0837   06/07/24 0500  Magnesium   Daily,   R     Question:  Specimen collection method  Answer:  Lab=Lab collect   06/06/24 1126   06/06/24 0303  Gastrointestinal Panel by PCR , Stool  (Gastrointestinal Panel by PCR, Stool                                                                                                                                                     **  Does Not include CLOSTRIDIUM DIFFICILE testing. **If CDIFF testing is needed, place order from the C Difficile Testing order set.**)  Once,   URGENT        06/06/24 0302            Antimicrobials/Microbiology: Anti-infectives (From admission, onward)    Start     Dose/Rate Route Frequency Ordered Stop   06/16/24 1000  amoxicillin  (AMOXIL ) capsule 1,000 mg        1,000 mg Oral 2 times daily 06/09/24 1406     06/10/24 1000  vancomycin  (VANCOCIN ) capsule 125 mg        125 mg Oral Daily 06/09/24 1406 06/18/24 0959   06/09/24 1600  meropenem  (MERREM ) 1 g in sodium chloride  0.9 % 100 mL IVPB        1 g 200 mL/hr over 30 Minutes Intravenous Every 8 hours 06/09/24 1406 06/16/24 0759   06/07/24 1000  vancomycin  (VANCOCIN ) capsule 125 mg  Status:  Discontinued        125 mg Oral Daily 06/06/24 1432 06/09/24 1406   06/06/24 1000  linezolid  (ZYVOX ) IVPB 600 mg  Status:  Discontinued        600 mg 300 mL/hr over 60 Minutes Intravenous Every 12 hours 06/06/24 0704 06/06/24 1432   06/06/24 0800  ceFEPIme  (MAXIPIME ) 2 g in sodium chloride  0.9 % 100 mL IVPB  Status:  Discontinued        2 g 200 mL/hr over 30 Minutes Intravenous Every 8 hours 06/06/24 0311 06/06/24 0704   06/06/24 0800  meropenem  (MERREM ) 1 g in sodium chloride  0.9 % 100 mL IVPB  Status:  Discontinued        1 g 200 mL/hr over 30 Minutes Intravenous Every 8 hours 06/06/24 0712 06/09/24 1406   06/06/24 0600  vancomycin  (VANCOCIN ) capsule 500 mg  Status:  Discontinued        500 mg Oral Every 6 hours 06/06/24 0310 06/06/24 1432   06/06/24 0400  metroNIDAZOLE  (FLAGYL ) IVPB 500 mg  Status:  Discontinued        500 mg 100 mL/hr over 60 Minutes Intravenous Every 8 hours 06/06/24 0310 06/06/24 0705   06/06/24 0200  vancomycin  (VANCOREADY) IVPB 2000 mg/400 mL  Status:  Discontinued        2,000 mg 100 mL/hr over 240 Minutes Intravenous  Once 06/06/24 0153 06/06/24 0705   06/06/24 0145  ampicillin  (OMNIPEN) 2 g in sodium chloride  0.9 % 100 mL IVPB        2 g 300 mL/hr over 20 Minutes Intravenous  Once 06/06/24 0139 06/06/24 0328   06/06/24 0000  ceFEPIme  (MAXIPIME ) 2 g in sodium chloride  0.9 % 100 mL IVPB        2  g 200 mL/hr over 30 Minutes Intravenous  Once 06/05/24 2358 06/06/24 0118         Component Value Date/Time   SDES BLOOD RIGHT HAND 06/10/2024 1813   SPECREQUEST  06/10/2024 1813    BOTTLES DRAWN AEROBIC AND ANAEROBIC Blood Culture results may not be optimal due to an inadequate volume of blood received in culture bottles   CULT  06/10/2024 1813    NO GROWTH < 24 HOURS Performed at [facility], [address] [place], [place] [address]    REPTSTATUS PENDING 06/10/2024 1813    Procedures:    Mennie LAMY, MD [facility] 06/11/2024, 11:06 AM

## 2024-06-11 NOTE — Progress Notes (Signed)
 Patient remained confused, has poor oral intake, had Ensure protein supplements only, patient's wife was at bedside. Frequent repositioning done and all the wound measured and dressing changed, has some oozing from left upper arm, ABD pad and kerlix applied and arms elevated.  Patient's catheter couldn't be changed today, report provided to next shift RN.

## 2024-06-12 DIAGNOSIS — E440 Moderate protein-calorie malnutrition: Secondary | ICD-10-CM | POA: Insufficient documentation

## 2024-06-12 LAB — BASIC METABOLIC PANEL WITH GFR
Anion gap: 6 (ref 5–15)
BUN: 16 mg/dL (ref 8–23)
CO2: 30 mmol/L (ref 22–32)
Calcium: 7.8 mg/dL — ABNORMAL LOW (ref 8.9–10.3)
Chloride: 102 mmol/L (ref 98–111)
Creatinine, Ser: 0.46 mg/dL — ABNORMAL LOW (ref 0.61–1.24)
GFR, Estimated: >60 mL/min (ref >60)
Glucose, Bld: 114 mg/dL — ABNORMAL HIGH (ref 70–99)
Potassium: 3.6 mmol/L (ref 3.5–5.1)
Sodium: 138 mmol/L (ref 135–145)

## 2024-06-12 LAB — CBC
HCT: 30 % — ABNORMAL LOW (ref 39.0–52.0)
Hemoglobin: 10.1 g/dL — ABNORMAL LOW (ref 13.0–17.0)
MCH: 32.8 pg (ref 26.0–34.0)
MCHC: 33.7 g/dL (ref 30.0–36.0)
MCV: 97.4 fL (ref 80.0–100.0)
Platelets: 68 K/uL — ABNORMAL LOW (ref 150–400)
RBC: 3.08 MIL/uL — ABNORMAL LOW (ref 4.22–5.81)
RDW: 15 % (ref 11.5–15.5)
WBC: 9.7 K/uL (ref 4.0–10.5)
nRBC: 0 % (ref 0.0–0.2)

## 2024-06-12 LAB — MAGNESIUM: Magnesium: 1.7 mg/dL (ref 1.7–2.4)

## 2024-06-12 LAB — GLUCOSE, CAPILLARY
Glucose-Capillary: 103 mg/dL — ABNORMAL HIGH (ref 70–99)
Glucose-Capillary: 122 mg/dL — ABNORMAL HIGH (ref 70–99)
Glucose-Capillary: 136 mg/dL — ABNORMAL HIGH (ref 70–99)
Glucose-Capillary: 132 mg/dL — ABNORMAL HIGH (ref 70–99)

## 2024-06-12 MED ORDER — HYDROMORPHONE HCL 1 MG/ML IJ SOLN
0.5000 mg | Freq: Once | INTRAMUSCULAR | Status: AC
  Administered 2024-06-12: 0.5 mg via INTRAVENOUS
  Filled 2024-06-12: qty 0.5

## 2024-06-12 MED ORDER — ENSURE PLUS HIGH PROTEIN PO LIQD
237.0000 mL | Freq: Three times a day (TID) | ORAL | Status: DC
  Administered 2024-06-12 – 2024-06-14 (×6): 237 mL via ORAL

## 2024-06-12 MED ADMIN — THERA M PLUS PO TABS: 1 | ORAL | NDC 1650058694

## 2024-06-12 MED FILL — Multiple Vitamin Tab: 1.0000 | ORAL | Qty: 1 | Status: AC

## 2024-06-12 NOTE — Progress Notes (Addendum)
 Verbal order with read back placed for Dilaudid  0.5 mg IV, for urologic procedure [place], NP declined to place order at this time.

## 2024-06-12 NOTE — Progress Notes (Signed)
° °      ° °                                                                                         °                       °  Re: Tim [name] Date of Birth: [birth year] Date: 06/12/2024  To Whom It May Concern:  Please be advised that the above-named patient will require a short-term nursing home stay-anticipated 30 days or less for rehabilitation and strengthening. The plan is to return home.

## 2024-06-12 NOTE — TOC Transition Note (Signed)
 Transition of Care [name]) - Discharge Note   Patient Details  Name: Tim [name] MRN: [id number] Date of Birth: [birth year]  Transition of [facility]) CM/SW Contact:  Montie LOISE Louder, LCSW Phone Number: 06/12/2024, 3:52 PM   Clinical Narrative:     CSW spoke with patient's spouse, [name]- CSW introduced self and explained role. CSW discuss with spouse, recommendation for short term rehab at [facility]. [name] agrees with recommendation and gave CSW permission to send out SNF referrals. CSW explained the Snf process. All questions answered.  TOC will provide bed offers once available  PSARR pending- will upload once signed   Montie Louder, MSW, LCSW Clinical Social Worker     Final next level of care: Skilled Nursing Facility Barriers to Discharge: Continued Medical Work up   Patient Goals and CMS Choice            Discharge Placement                       Discharge Plan and Services Additional resources added to the After Visit Summary for   In-house Referral: Clinical Social Work Discharge Planning Services: [employer] Consult Post Acute Care Choice: Home Health, Resumption of Svcs/PTA Provider                    HH Arranged: CHARITY FUNDRAISER, PT HH Agency: [facility] Date HH Agency Contacted: 06/10/24 Time HH Agency Contacted: 1455 Representative spoke with at [facility] Agency: [facility]  Social Drivers of Health (SDOH) Interventions SDOH Screenings   Food Insecurity: No Food Insecurity (06/07/2024)  Housing: Low Risk (06/07/2024)  Recent Concern: Housing - High Risk (05/01/2024)   Received from [facility]  Transportation Needs: No Transportation Needs (06/07/2024)  Utilities: Not At Risk (06/07/2024)  Depression (PHQ2-9): Low Risk (03/31/2024)  Financial Resource Strain: Low Risk (09/23/2023)  Physical Activity: Unknown (09/23/2023)  Social Connections: Moderately Isolated (06/07/2024)  Stress: No Stress Concern Present (05/01/2024)   Received from [facility]  Tobacco Use: Low Risk (06/05/2024)     Readmission Risk Interventions    04/15/2024    3:20 PM 04/14/2024    4:33 PM  Readmission Risk Prevention Plan  Transportation Screening Complete Complete  PCP or Specialist Appt within 5-7 Days Complete Complete  Home Care Screening Complete Complete  Medication Review (RN CM) Complete Referral to Pharmacy

## 2024-06-12 NOTE — Progress Notes (Signed)
 PROGRESS NOTE Tim [name]  FMW:[id number] DOB: 1941/04/23 DOA: 06/05/2024 PCP: [name], MD  Brief Narrative/Hospital Course: Tim [name] is a 83 y.o. 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 Patient has history of enterococcal infection of the AICD lead that is being treated with chronic amoxicillin .   Recent admission at [facility] on 10/9-10/17:mixed hypovolemic/septic shock. At [name] on 10/29-11/1:[name] treated w/ vanc. Wife states he has not had diarrhea symptoms and off po vanc. Did have diarrhea on 12/3 and so did wife. Wife also states he has poor po intake.  On 12/4 overnight patient had high fever. EMS called found temp of 103.7. In [name] 99.3 F and wbc 6.7. LA 5. Chronic foley in place w/ hematuria which was replaced. UA inconclusive. INR 3.7. CXR unremarkable. Cultures obtained and started on broad spectrum abx. Despite fluids remains hypotensive started on levoPHED  12/5 admit to [facility] 12/6 spiked fever with Tmax of 100.7, blood culture is growing ESBL gram-negative rods with E. coli/Klebsiella.  Vasopressor requirement improving, Levophed  at 13, vasopressin  at 0.03 unit.  Foley catheter was exchanged by urology, no more hematuria 12/8 norepinephrine  requirements coming down.  Platelet count stabilized.  LFTs climbing compared to last 72 hours 12/10: Transferred to [facility] service  Subjective: Seen and examined today Wife at bedside Overnight vitals stable, on room air , has been intermittently confused labs reviewed stable electrolytes CBC-WBC 56> 46> 24 and now at 11, hemoglobin stable 9>9.5> 10.1  platelet 32>35> 46> 68  Assessment and plan:  Septic shock-shock resolved Kleb pneumonia E coli bacteremia UTI-with history of BPH and  chronic Foley Leukemoid reaction with leukocytosis Hematuria due to traumatic Foley insertion resolved: Admitted with septic shock with multiple infection.  Shock resolved.  Now on low-dose midodrine  for support-SBP goal above 90 holding antihypertensive.  Blood culture from admission 12/4-5: E. coli and Klebsiella positive repeat blood culture 12/9 NGTD  Urine culture on admission E. coli positive Foley changed by urology.  Patient vitally stable afebrile and leukocytosis significantly improvng Continue meropenem  transitioning to amoxicillin  to 12/15 Continue contact precaution for ESBL monitor CBC. Hematuria catheter replacement per urology today.  Normal materia   H/o C diff Continue suppressive vancomycin  while on meropenem .  No diarrhea but constipation   Elevated transaminases: Likely in the setting of shock, resolving.    Aortic stenosis status post TAVR. Last TEE in October 2025 showed ejection fraction 65 to 70%  Anasarca:Volume overload with net balance Improving Net IO Since Admission: -752.9 mL [06/12/24 1115] Monitor fluid status Holding home meds for now, received Lasix  in ICU good output 2940cc Current weight 263 on admission 253lb.  Will do 1 more dose of Lasix   Paroxysmal A-fib with RVR: H/o CHB, has PPM: Rate currently controlled.  Holding home Coreg and aspirin  in the setting of thrombocytopenia hypertension Needs PPM replacement Feb 2026  Thrombocytopenia:  likely 2/2 sepsis and antibiotics, and proving continue to trend holding DVT prophylaxis for now   Anemia of chronic illness Monitor and transfuse for Hb <7   Hyperglycemia Cont SSI PRN   Goals of care Deconditioning/debility Recurrent hospitalization question rehab potential, continue PT OT aggressively.  Patient is DNR limited.   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  Continue delirium precaution frequent reorientation PT OT and mobilize    Suicidal ideation: Psych has been consulted continue Cymbalta , Atarax . Safety sitter has been discontinued    Urinary retention long-term would consider if he is a candidate for suprapubic catheter. change CBI to coude PER COUDE TEAM  Class I Obesity w/ Body mass index is 32.2 kg/m.: Will benefit with PCP follow-up, weight loss,healthy lifestyle and outpatient sleep eval if not done.  GOC: DNR Wife overhwlemed with current illness and provided therapeutic listening- upset about last SNF  Left upper extremity single-lumen  midline 12/9 Foley in place due to exchange 12/10  Mobility: PT Orders: Active PT Follow up Rec: Skilled Nursing-Short Term Rehab (<3 Hours/Day)06/11/2024 1203   DVT prophylaxis: SCDs Start: 06/06/24 0316 Code Status:   Code Status: Limited: Do not attempt resuscitation (DNR) -DNR-LIMITED -Do Not Intubate/DNI  Family Communication: plan of care discussed with patient/wife at bedside. Patient status is: Remains hospitalized because of severity of illness Level of care: Progressive   Dispo: The patient is from: HOME            Anticipated disposition: TBD Objective: Vitals last 24 hrs: Vitals:   06/12/24 0318 06/12/24 0531 06/12/24 0755 06/12/24 0902  BP: 133/69   112/60  Pulse: 87   92  Resp: 20 13  13   Temp: 98.1 F (36.7 C)   98.5 F (36.9 C)  TempSrc: Oral   Oral  SpO2: 96%  99% 97%  Weight:  120 kg    Height:        Physical Examination: General exam: alert awake, oriented x 2-3 obese  HEENT:Oral mucosa moist, Ear/Nose WNL grossly Respiratory system: Bilaterally diminished BS,no use of accessory muscle Cardiovascular system: S1 & S2 +, No JVD. Gastrointestinal system: Abdomen soft,NT,ND, BS+ Nervous System: Alert,  awake, moving all extremities,and following commands. Extremities: extremities warm, leg edema +, left TMA old Skin: Warm, no rashes MSK: Normal muscle bulk,tone, power   Medications reviewed:  Scheduled Meds:  [START ON 06/16/2024] amoxicillin   1,000 mg Oral BID   arformoterol   15 mcg Nebulization Q12H   ARIPiprazole   1 mg Oral QHS   budesonide  (PULMICORT ) nebulizer solution  0.5 mg Nebulization BID   Chlorhexidine  Gluconate Cloth  6 each Topical Daily   collagenase   1 Application Topical Daily   DULoxetine   20 mg Oral Daily   famotidine   20 mg Oral BID   feeding supplement  237 mL Oral TID BM   gabapentin   100 mg Oral TID   Gerhardt's butt cream   Topical BID   insulin  aspart  0-6 Units Subcutaneous TID AC & HS   lidocaine   1 patch Transdermal Q24H   lidocaine   1 Application Urethral Once   midodrine   5 mg Oral TID WC   multivitamin with minerals  1 tablet Oral Daily   revefenacin   175 mcg Nebulization Daily   sodium chloride  flush  10-40 mL Intracatheter Q12H   vancomycin   125 mg Oral Daily   Continuous Infusions:  meropenem  (MERREM ) IV 1 g (06/11/24 2326)   Diet: Diet Order             DIET DYS 3 Room service appropriate? Yes with Assist; Fluid consistency: Thin  Diet effective now  Data Reviewed: I have personally reviewed following labs and imaging studies ( see epic result tab) CBC: Recent Labs  Lab 06/06/24 0049 06/06/24 0111 06/08/24 0435 06/08/24 1013 06/09/24 0420 06/10/24 0410 06/10/24 1150 06/11/24 0355 06/12/24 0500  WBC 6.7   < > 56.7*  --  46.1* 24.0*  --  11.6* 9.7  NEUTROABS 5.8  --   --   --   --   --   --   --   --   HGB 12.9*   < > 10.5*  --  9.7* 9.0*  --  9.5* 10.1*  HCT 39.1   < > 30.4*  --  29.0* 26.3*  --  28.4* 30.0*  MCV 98.5   < > 95.3  --  96.3 95.3  --  97.9 97.4  PLT 61*   < > 43*   < > 39* 32* 35* 46* 68*   < > = values in this interval not displayed.   CMP: Recent Labs  Lab 06/08/24 0435  06/08/24 1650 06/09/24 0645 06/10/24 0410 06/11/24 0355 06/12/24 0500  NA 138 136 139 138 139 138  K 3.8 3.9 3.7 3.6 4.0 3.6  CL 106 104 104 105 105 102  CO2 24 23 26 22 30 30   GLUCOSE 156* 136* 111* 100* 90 114*  BUN 27* 28* 25* 22 19 16   CREATININE 0.79 0.64 0.81 0.61 0.81 0.46*  CALCIUM  8.0* 7.9* 8.1* 7.8* 7.7* 7.8*  MG 2.3  --  2.1 1.8 1.7 1.7   GFR: Estimated Creatinine Clearance: 97.3 mL/min (A) (by C-G formula based on SCr of 0.46 mg/dL (L)). Recent Labs  Lab 06/07/24 0357 06/08/24 0435 06/09/24 0645 06/10/24 0410 06/10/24 1150 06/11/24 0355  AST 274* 242* 523* 370*  --  202*  ALT 110* 125* 238* 256*  --  196*  ALKPHOS 81 103 198* 218*  --  212*  BILITOT 1.1 1.5* 1.5* 1.1 1.1 1.1  PROT 4.9* 5.1* 4.8* 4.5*  --  4.4*  ALBUMIN 2.1* 2.1* 1.9* 1.8*  --  1.8*   No results for input(s): LIPASE, AMYLASE in the last 168 hours.  Recent Labs  Lab 06/06/24 0835 06/11/24 0355  AMMONIA 37* <13   Coagulation Profile:  Recent Labs  Lab 06/06/24 0835 06/06/24 1210 06/07/24 0357 06/08/24 1013 06/10/24 1150  INR 2.7* 2.3* 2.3* 1.2 1.2   Unresulted Labs (From admission, onward)     Start     Ordered   06/07/24 0500  Magnesium   Daily,   R     Question:  Specimen collection method  Answer:  Lab=Lab collect   06/06/24 1126   06/06/24 0303  Gastrointestinal Panel by PCR , Stool  (Gastrointestinal Panel by PCR, Stool                                                                                                                                                     **  Does Not include CLOSTRIDIUM DIFFICILE testing. **If CDIFF testing is needed, place order from the C Difficile Testing order set.**)  Once,   URGENT        06/06/24 0302           Antimicrobials/Microbiology: Anti-infectives (From admission, onward)    Start     Dose/Rate Route Frequency Ordered Stop   06/16/24 1000  amoxicillin  (AMOXIL ) capsule 1,000 mg        1,000 mg Oral 2 times daily 06/09/24 1406      06/10/24 1000  vancomycin  (VANCOCIN ) capsule 125 mg        125 mg Oral Daily 06/09/24 1406 06/18/24 0959   06/09/24 1600  meropenem  (MERREM ) 1 g in sodium chloride  0.9 % 100 mL IVPB        1 g 200 mL/hr over 30 Minutes Intravenous Every 8 hours 06/09/24 1406 06/16/24 0759   06/07/24 1000  vancomycin  (VANCOCIN ) capsule 125 mg  Status:  Discontinued        125 mg Oral Daily 06/06/24 1432 06/09/24 1406   06/06/24 1000  linezolid  (ZYVOX ) IVPB 600 mg  Status:  Discontinued        600 mg 300 mL/hr over 60 Minutes Intravenous Every 12 hours 06/06/24 0704 06/06/24 1432   06/06/24 0800  ceFEPIme  (MAXIPIME ) 2 g in sodium chloride  0.9 % 100 mL IVPB  Status:  Discontinued        2 g 200 mL/hr over 30 Minutes Intravenous Every 8 hours 06/06/24 0311 06/06/24 0704   06/06/24 0800  meropenem  (MERREM ) 1 g in sodium chloride  0.9 % 100 mL IVPB  Status:  Discontinued        1 g 200 mL/hr over 30 Minutes Intravenous Every 8 hours 06/06/24 0712 06/09/24 1406   06/06/24 0600  vancomycin  (VANCOCIN ) capsule 500 mg  Status:  Discontinued        500 mg Oral Every 6 hours 06/06/24 0310 06/06/24 1432   06/06/24 0400  metroNIDAZOLE  (FLAGYL ) IVPB 500 mg  Status:  Discontinued        500 mg 100 mL/hr over 60 Minutes Intravenous Every 8 hours 06/06/24 0310 06/06/24 0705   06/06/24 0200  vancomycin  (VANCOREADY) IVPB 2000 mg/400 mL  Status:  Discontinued        2,000 mg 100 mL/hr over 240 Minutes Intravenous  Once 06/06/24 0153 06/06/24 0705   06/06/24 0145  ampicillin  (OMNIPEN) 2 g in sodium chloride  0.9 % 100 mL IVPB        2 g 300 mL/hr over 20 Minutes Intravenous  Once 06/06/24 0139 06/06/24 0328   06/06/24 0000  ceFEPIme  (MAXIPIME ) 2 g in sodium chloride  0.9 % 100 mL IVPB        2 g 200 mL/hr over 30 Minutes Intravenous  Once 06/05/24 2358 06/06/24 0118         Component Value Date/Time   SDES BLOOD RIGHT HAND 06/10/2024 1813   SPECREQUEST  06/10/2024 1813    BOTTLES DRAWN AEROBIC AND ANAEROBIC Blood  Culture results may not be optimal due to an inadequate volume of blood received in culture bottles   CULT  06/10/2024 1813    NO GROWTH 2 DAYS Performed at [facility], [address] [place], [place] [address]    REPTSTATUS PENDING 06/10/2024 1813    Procedures:    Mennie LAMY, MD [facility] 06/12/2024, 11:15 AM

## 2024-06-12 NOTE — [place] FL2 (Signed)
 [place]  MEDICAID FL2 LEVEL OF CARE FORM     IDENTIFICATION  Patient Name: Tim [name] Birthdate: 1941-04-18 Sex: male Admission Date (Current Location): 06/05/2024  [facility] and [place] Number:  [employer] and Address:  [facility] [place], [address], [place], [place] [address]      Provider Number: [id number]  Attending Physician Name and Address:  Christobal Guadalajara, MD  Relative Name and Phone Number:       Current Level of Care: Hospital Recommended Level of Care: Skilled Nursing Facility Prior Approval Number:    Date Approved/Denied:   PASRR Number: Pending  Discharge Plan:      Current Diagnoses: Patient Active Problem List   Diagnosis Date Noted   Malnutrition of moderate degree 06/12/2024   Suicidal ideation 06/10/2024   Encephalopathy 06/10/2024   Debility 06/10/2024   Septic shock (HCC) 06/06/2024   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   AKI (acute kidney injury) 04/12/2024   Confusion 04/12/2024   Uremia 04/12/2024   Shock (HCC) 04/10/2024   Osteomyelitis of left foot (HCC) 05/03/2023   Venous stasis dermatitis 12/27/2021   Cellulitis 12/14/2021   Wound of right lower extremity 12/14/2021   Transient hypotension 12/14/2021   Heart failure with preserved ejection fraction (HCC) 12/14/2021   Prediabetes 12/14/2021   Thrombocytopenia 12/14/2021   Obesity, Class III, BMI 40-49.9 (morbid obesity) (HCC) 12/14/2021   BPH (benign prostatic hyperplasia) 12/14/2021   Vaccine counseling 11/16/2021   Weakness 02/23/2021   Physical deconditioning 02/23/2021   Rhinitis 07/14/2020   Mitral valve vegetation 05/28/2020   Non-pressure chronic ulcer of other part of left foot limited to breakdown of skin (HCC)    Esophageal dysphagia    Bacteremia    Endocarditis of mitral valve    Fever 04/28/2020   Cervical lymphadenopathy 03/15/2020   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   ICD (implantable  cardioverter-defibrillator) infection 12/22/2019   Gram-positive bacteremia 11/27/2019   FUO (fever of unknown origin) 11/26/2019   Hypotension 11/05/2019   Wound dehiscence 10/22/2019   PFO (patent foramen ovale) 10/06/2019   ICD (implantable cardioverter-defibrillator) battery depletion    Pressure injury of skin 09/13/2019   Enterococcal bacteremia history  09/13/2019   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   Urinary retention 09/13/2019   Phimosis 09/13/2019   SIRS (systemic inflammatory response syndrome) (HCC) 09/12/2019   Sepsis (HCC) 09/12/2019   Enterococcus UTI 09/12/2019   Cellulitis of foot    History of transmetatarsal amputation of left foot (HCC)    Subacute osteomyelitis, left ankle and foot (HCC)    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) 04/17/2018   Diabetic neuropathy, painful (HCC) 12/26/2016   Neuropathic pain of foot, left 06/20/2016   Insulin  resistance 07/22/2015   Status post amputation of left great toe 06/08/2015   Spinal stenosis of lumbar region 02/16/2015   Hereditary and idiopathic peripheral neuropathy 01/12/2015   Oral thrush 12/28/2014   Orthostatic dizziness 05/24/2014   Paresthesias with subjective weakness 01/30/2014   Bilateral thigh pain 01/30/2014   Abdominal wall pain in right upper quadrant 01/22/2014   Gross hematuria 09/25/2013   Intertrigo 09/25/2013   IBS (irritable bowel syndrome) 09/11/2013   Chronic pain syndrome 08/06/2013   CAD (coronary artery disease), native coronary artery 12/25/2012   PAF (paroxysmal atrial fibrillation) (HCC) 12/25/2012   Moderate aortic stenosis 12/25/2012   Aortic stenosis 12/24/2012  History of colonic polyps 10/30/2012   Diverticulosis of colon 10/30/2012   Internal hemorrhoids 10/30/2012   Change in bowel habits intermittent loose stools 10/30/2012    Routine health maintenance 06/09/2012   Hereditary peripheral neuropathy 10/10/2011   Long term current use of anticoagulant - apixiban 08/16/2010   Dyspnea on exertion 09/08/2009   HYPERCHOLESTEROLEMIA-PURE 01/13/2009   Essential hypertension, benign 01/13/2009   EDEMA 01/13/2009   GOUT 01/12/2009   GLAUCOMA 01/12/2009   Venous insufficiency (chronic) (peripheral) 01/12/2009   COPD (chronic obstructive pulmonary disease) (HCC) 01/12/2009   VENTRAL HERNIA 01/12/2009   OSTEOARTHRITIS 01/12/2009   ARTHRITIS 01/12/2009   DEGENERATIVE DISC DISEASE 01/12/2009   CATARACT, HX OF 01/12/2009   HEART MURMUR, HX OF 01/12/2009   BENIGN PROSTATIC HYPERTROPHY, HX OF 01/12/2009   Obesity 01/17/2007   OBSTRUCTIVE SLEEP APNEA 01/17/2007   ASTHMA 01/17/2007   GERD 01/17/2007   HALLUX VALGUS, ACQUIRED 01/17/2007   Pulmonary hypertension (HCC) 01/17/2007    Orientation RESPIRATION BLADDER Height & Weight     Self  Normal Incontinent Weight: 264 lb 8.8 oz (120 kg) Height:  6' 4 (193 cm)  BEHAVIORAL SYMPTOMS/MOOD NEUROLOGICAL BOWEL NUTRITION STATUS      Continent Diet (please see discharge summary)  AMBULATORY STATUS COMMUNICATION OF NEEDS Skin   Extensive Assist Verbally Skin abrasions, Other (Comment) (pressure injury sacrum medial Stage III, Arm Anterior Left & Right, Pretibial Right & Left Arm, Traumatic Arm Right & Left, Prressure injury lefdt thigh stage III)                       Personal Care Assistance Level of Assistance  Bathing, Feeding, Dressing Bathing Assistance: Maximum assistance Feeding assistance: Maximum assistance Dressing Assistance: Maximum assistance     Functional Limitations Info  Sight, Hearing, Speech Sight Info: Adequate Hearing Info: Adequate Speech Info: Adequate    SPECIAL CARE FACTORS FREQUENCY  PT (By licensed PT), OT (By licensed OT)     PT Frequency: 5x per week OT Frequency: 5x per week            Contractures Contractures Info: Not  present    Additional Factors Info  Code Status, Allergies, Psychotropic Code Status Info: DNR- Limited Allergies Info: Brimonidine Tartrate High Unspecified Shortness Of Breath Alphagan-Shortness of breath  Brinzolamide High Unspecified Shortness Of Breath AZOPT- Shortness of breath  Latanoprost High Unspecified Shortness Of Breath XALATAN- Shortness of breath  Nucynta  (tapentadol ) High Allergy Shortness Of Breath   Sulfa Antibiotics High  Palpitations   Timolol Maleate High Intolerance Shortness Of Breath, Other (See Comments) TIMOPTIC- Aggravated asthma  Diltiazem Medium Allergy Swelling  leg swelling  Rofecoxib Medium Allergy Swelling  VIOXX- leg swelling  Vancomycin  Medium Allergy Rash Tolerates oral vanc  Codeine Not Specified Unspecified Other (See Comments) Childhood reaction  Tamsulosin Not Specified Contraindication Other (See Comments) Dizziness  Celecoxib Low Intolerance Other (See Comments) CELLBREX-confusion  Colchicine Low Intolerance Diarrhea diarrhea  Tape Low Allergy Rash Psychotropic Info: see discharge summary- ARIPiprazole  (ABILIFY ) tablet 1 mg,DULoxetine  (CYMBALTA ) DR capsule 20 mg         Current Medications (06/12/2024):  This is the current hospital active medication list Current Facility-Administered Medications  Medication Dose Route Frequency Provider Last Rate Last Admin   acetaminophen  (TYLENOL ) tablet 650 mg  650 mg Oral Q4H PRN Payne, [name] D, PA-C       [START ON 06/16/2024] amoxicillin  (AMOXIL ) capsule 1,000 mg  1,000 mg Oral BID Gretta Leita SQUIBB, DO  arformoterol  (BROVANA ) nebulizer solution 15 mcg  15 mcg Nebulization Q12H Payne, [name] D, PA-C   15 mcg at 06/12/24 9244   ARIPiprazole  (ABILIFY ) tablet 1 mg  1 mg Oral QHS Lenard Calin, MD   1 mg at 06/11/24 2238   budesonide  (PULMICORT ) nebulizer solution 0.5 mg  0.5 mg Nebulization BID Payne, [name] D, PA-C   0.5 mg at 06/12/24 9244   Chlorhexidine  Gluconate Cloth 2 % PADS 6 each  6 each Topical Daily Payne,  [name] D, PA-C   6 each at 06/12/24 1032   collagenase  (SANTYL ) ointment 1 Application  1 Application Topical Daily Gretta Leita SQUIBB, DO   1 Application at 06/12/24 1033   diclofenac  Sodium (VOLTAREN ) 1 % topical gel 2 g  2 g Topical QID PRN Harold Scholz, MD       docusate sodium  (COLACE) capsule 100 mg  100 mg Oral BID PRN Payne, [name] D, PA-C       DULoxetine  (CYMBALTA ) DR capsule 20 mg  20 mg Oral Daily Rainey, Donovan, MD   20 mg at 06/12/24 1032   famotidine  (PEPCID ) tablet 20 mg  20 mg Oral BID Babcock, Peter E, NP   20 mg at 06/12/24 1032   feeding supplement (ENSURE PLUS HIGH PROTEIN) liquid 237 mL  237 mL Oral TID BM Kc, Mennie, MD   237 mL at 06/12/24 1210   gabapentin  (NEURONTIN ) capsule 100 mg  100 mg Oral TID Harold Scholz, MD   100 mg at 06/12/24 1031   Gerhardt's butt cream   Topical BID Paliwal, Aditya, MD   Given at 06/12/24 1034   hydrOXYzine  (ATARAX ) tablet 25 mg  25 mg Oral TID PRN Lenard Calin, MD   25 mg at 06/10/24 2217   insulin  aspart (novoLOG ) injection 0-6 Units  0-6 Units Subcutaneous TID AC & HS Babcock, Peter E, NP   1 Units at 06/11/24 2130   ipratropium-albuterol  (DUONEB) 0.5-2.5 (3) MG/3ML nebulizer solution 3 mL  3 mL Nebulization Q4H PRN Payne, [name] D, PA-C       lidocaine  (LIDODERM ) 5 % 1 patch  1 patch Transdermal Q24H Harold Scholz, MD   1 patch at 06/12/24 1032   lidocaine  (XYLOCAINE ) 2 % jelly 1 Application  1 Application Urethral Once Gretta Leita P, DO       meropenem  (MERREM ) 1 g in sodium chloride  0.9 % 100 mL IVPB  1 g Intravenous Q8H Gretta Leita P, DO 200 mL/hr at 06/12/24 1213 1 g at 06/12/24 1213   midodrine  (PROAMATINE ) tablet 5 mg  5 mg Oral TID WC Babcock, Peter E, NP   5 mg at 06/12/24 1210   multivitamin with minerals tablet 1 tablet  1 tablet Oral Daily Christobal Mennie, MD   1 tablet at 06/12/24 1031   Oral care mouth rinse  15 mL Mouth Rinse PRN Chand, Sudham, MD       oxyCODONE  (Oxy IR/ROXICODONE ) immediate release tablet 5 mg  5 mg Oral Q6H PRN  Paliwal, Aditya, MD   5 mg at 06/11/24 1244   Or   oxyCODONE  (Oxy IR/ROXICODONE ) immediate release tablet 10 mg  10 mg Oral Q6H PRN Paliwal, Aditya, MD   10 mg at 06/11/24 2237   polyethylene glycol (MIRALAX  / GLYCOLAX ) packet 17 g  17 g Oral Daily PRN Payne, [name] D, PA-C       revefenacin  (YUPELRI ) nebulizer solution 175 mcg  175 mcg Nebulization Daily Payne, [name] D, PA-C   175 mcg at 06/12/24 0754  sodium chloride  flush (NS) 0.9 % injection 10-40 mL  10-40 mL Intracatheter Q12H Gretta Doffing P, DO   10 mL at 06/12/24 1034   sodium chloride  flush (NS) 0.9 % injection 10-40 mL  10-40 mL Intracatheter PRN Gretta Doffing SQUIBB, DO       vancomycin  (VANCOCIN ) capsule 125 mg  125 mg Oral Daily Gretta Doffing P, DO   125 mg at 06/12/24 1031     Discharge Medications: Please see discharge summary for a list of discharge medications.  Relevant Imaging Results:  Relevant Lab Results:   Additional Information SSN # [id number]  Montie LOISE Louder, LCSW

## 2024-06-12 NOTE — Progress Notes (Signed)
 Daily Progress Note   Patient Name: Tim [name]       Date: 06/12/2024 DOB: [birth year]  Age: 83 y.o. MRN#: [id number] Attending Physician: Christobal Guadalajara, MD Primary Care Physician: [name], MD Admit Date: 06/05/2024 Length of Stay: 6 days  Reason for Consultation/Follow-up: Establishing goals of care  Subjective:  Subjective: Chart reviewed including personal review of pertinent labs, imaging, and notes from palliative care.  Now out of ICU and hospitalist notes reviewed.  I saw and examined Tim [name] today.  His wife was at the bedside as well.  He was lying in bed in no distress but does not really open his eyes during discussion with me today.  Expresses he currently has no pain or shortness of breath, but he remains frustrated overall with lack of progress.  States his primary concern at this time is getting cleaned up from bowel movement.  Overall, he feels that he is less confused than prior and seems more tired but is able to engage in conversation.  I also discussed with his wife.  We talked about concern with continued confusion.  She reports that she knows she cannot care for him at home but is concerned about plan for him to discharge to skilled facility as he had a bad experience at last facility.  She sees that he continues to decline overall, but is hopeful for some stabilization of function and that he can eventually become strong enough to return home.  We talked about her being overwhelmed and encouraged her to take time for herself and self-care.  Palliative to continue to follow.  Review of Systems Reports confusion Objective:   Vital Signs:  BP (!) 96/48 (BP Location: Right Arm)   Pulse 95   Temp 98.6 F (37 C) (Oral)   Resp 11   Ht 6' 4 (1.93 m)   Wt 120 kg   SpO2 97%   BMI 32.20 kg/m   Physical Exam: General: NAD, alert, ? confused Eyes:  anicteric sclera HENT: normocephalic, atraumatic, moist mucous membranes Cardiovascular:  RRR Respiratory: no increased work of breathing noted, not in respiratory distress Abdomen: not distended Neuro: A&O, following commands easily Psych: flat affect, tangential with circular thought patterns  Imaging: @IMAGES @  I personally reviewed recent imaging.   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  Recommendations/Plan: # Complex medical decision making/goals of care:  - Continue current care.  Currently beginning to pursue options for rehab at time of discharge from the hospital.  Wife understands necessity of this but remains concerned due to prior poor experiences.  -  Code Status: Limited: Do not attempt resuscitation (DNR) -DNR-LIMITED -Do Not Intubate/DNI   Prognosis: Unable to determine  # Discharge Planning: To Be Determined  Discussed with: patient, wife, bedside care team Thank you for allowing the palliative care team to participate in the care Tim [name].  Amaryllis Meissner, MD Palliative Care Provider PMT # [phone]  If patient remains symptomatic despite maximum doses, please call PMT at [phone] between 0700 and 1900. Outside of these hours, please call attending, as PMT does not have night coverage.   I personally spent a total of 40 minutes in the care of the patient today including preparing to see the patient, getting/reviewing separately obtained  history, performing a medically appropriate exam/evaluation, counseling and educating, and documenting clinical information in the EHR.

## 2024-06-12 NOTE — Progress Notes (Signed)
 S: NP Cam called me as the on-call doc this afternoon for assistance changing the patient's Foley catheter. Patient was having some penile pain.    O: Patient in no acute distress.  He was telling me about his musical instrument playing. Penile edema with fusion of the foreskin to the glans.    Procedure: with Cam retracting the foreskin to expose the glans I was able to prep the meatus and then passed a 22 French coud catheter without difficulty into the bladder.  The bladder was filled and seated at the bladder neck.  Patient tolerated well.  A/P: Urinary retention-three-way hematuria catheter downsized to a 2 8 catheter.  Draining clear yellow urine.  Consider voiding trial if he is more stable and able to ambulate.  Follow-up with Dr. Renda as planned for hematuria evaluation.

## 2024-06-13 ENCOUNTER — Inpatient Hospital Stay (HOSPITAL_COMMUNITY)

## 2024-06-13 LAB — GLUCOSE, CAPILLARY
Glucose-Capillary: 127 mg/dL — ABNORMAL HIGH (ref 70–99)
Glucose-Capillary: 116 mg/dL — ABNORMAL HIGH (ref 70–99)
Glucose-Capillary: 135 mg/dL — ABNORMAL HIGH (ref 70–99)
Glucose-Capillary: 133 mg/dL — ABNORMAL HIGH (ref 70–99)
Glucose-Capillary: 126 mg/dL — ABNORMAL HIGH (ref 70–99)

## 2024-06-13 LAB — BASIC METABOLIC PANEL WITH GFR
Anion gap: 11 (ref 5–15)
BUN: 16 mg/dL (ref 8–23)
CO2: 23 mmol/L (ref 22–32)
Calcium: 7.7 mg/dL — ABNORMAL LOW (ref 8.9–10.3)
Chloride: 103 mmol/L (ref 98–111)
Creatinine, Ser: 0.66 mg/dL (ref 0.61–1.24)
GFR, Estimated: >60 mL/min (ref >60)
Glucose, Bld: 138 mg/dL — ABNORMAL HIGH (ref 70–99)
Potassium: 4.1 mmol/L (ref 3.5–5.1)
Sodium: 137 mmol/L (ref 135–145)

## 2024-06-13 LAB — CBC
HCT: 30.3 % — ABNORMAL LOW (ref 39.0–52.0)
Hemoglobin: 10.1 g/dL — ABNORMAL LOW (ref 13.0–17.0)
MCH: 31.9 pg (ref 26.0–34.0)
MCHC: 33.3 g/dL (ref 30.0–36.0)
MCV: 95.6 fL (ref 80.0–100.0)
Platelets: 111 K/uL — ABNORMAL LOW (ref 150–400)
RBC: 3.17 MIL/uL — ABNORMAL LOW (ref 4.22–5.81)
RDW: 15 % (ref 11.5–15.5)
WBC: 8.6 K/uL (ref 4.0–10.5)
nRBC: 0 % (ref 0.0–0.2)

## 2024-06-13 LAB — MAGNESIUM: Magnesium: 1.5 mg/dL — ABNORMAL LOW (ref 1.7–2.4)

## 2024-06-13 MED ORDER — FUROSEMIDE 40 MG PO TABS
40.0000 mg | ORAL_TABLET | Freq: Every day | ORAL | Status: DC
  Administered 2024-06-13: 40 mg via ORAL
  Filled 2024-06-13: qty 1

## 2024-06-13 MED ORDER — HYDROMORPHONE HCL 1 MG/ML IJ SOLN: 0.5000 mg | INTRAMUSCULAR | Status: DC | PRN

## 2024-06-13 MED ORDER — MAGNESIUM OXIDE -MG SUPPLEMENT 400 (240 MG) MG PO TABS
400.0000 mg | ORAL_TABLET | Freq: Two times a day (BID) | ORAL | Status: AC
  Administered 2024-06-13 – 2024-06-14 (×4): 400 mg via ORAL
  Filled 2024-06-13 (×4): qty 1

## 2024-06-13 MED ORDER — ONDANSETRON HCL 4 MG/2ML IJ SOLN
4.0000 mg | Freq: Four times a day (QID) | INTRAMUSCULAR | Status: DC | PRN
  Administered 2024-06-13 (×2): 4 mg via INTRAVENOUS
  Filled 2024-06-13 (×2): qty 2

## 2024-06-13 MED ORDER — OXYCODONE HCL 5 MG PO TABS
10.0000 mg | ORAL_TABLET | Freq: Four times a day (QID) | ORAL | Status: DC | PRN
  Administered 2024-06-13 – 2024-06-25 (×13): 10 mg via ORAL
  Filled 2024-06-13 (×14): qty 2

## 2024-06-13 MED ORDER — MIRTAZAPINE 15 MG PO TBDP
15.0000 mg | ORAL_TABLET | Freq: Every day | ORAL | Status: DC
  Administered 2024-06-13 – 2024-06-15 (×3): 15 mg via ORAL
  Filled 2024-06-13 (×4): qty 1

## 2024-06-13 MED ORDER — BISACODYL 10 MG RE SUPP
10.0000 mg | Freq: Once | RECTAL | Status: AC
  Administered 2024-06-13: 10 mg via RECTAL
  Filled 2024-06-13: qty 1

## 2024-06-13 MED ADMIN — Metoclopramide HCl Inj 5 MG/ML (Base Equivalent): 10 mg | INTRAVENOUS | NDC 00409341421

## 2024-06-13 MED ADMIN — THERA M PLUS PO TABS: 1 | ORAL | NDC 1650058694

## 2024-06-13 MED FILL — Metoclopramide HCl Inj 5 MG/ML (Base Equivalent): 10.0000 mg | INTRAMUSCULAR | Qty: 2 | Status: AC

## 2024-06-13 NOTE — Plan of Care (Signed)
°  Problem: Education: Goal: Knowledge of General Education information will improve Description: Including pain rating scale, medication(s)/side effects and non-pharmacologic comfort measures Outcome: Not Progressing   Problem: Clinical Measurements: Goal: Diagnostic test results will improve Outcome: Not Progressing   Problem: Skin Integrity: Goal: Risk for impaired skin integrity will decrease Outcome: Not Progressing

## 2024-06-13 NOTE — Plan of Care (Signed)

## 2024-06-13 NOTE — Progress Notes (Addendum)
 Palliative Medicine Inpatient Follow Up Note   HPI: 83 y.o. 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    Patient has history of enterococcal infection of the AICD lead that is being treated with chronic amoxicillin .  Patient recently admitted at [facility] from 10/9-10/17 with mixed hypovolemic/septic shock.  Also noted patient has been admitted to [facility] from 10/29-11/1 with [name] and was treated with vancomycin .  Wife states he has poor oral intake.  On 06/05/2024 overnight patient had high fever.  EMS called found temperature of 103.7.  Patient has chronic Foley in place with hematuria, Foley replaced.  Despite IV fluids remains hypotensive, therefore started on levo drip.  Transferred to ICU services for intensive care management.   Worth to note that patient has had 2 inpatient admissions in the last 6 months.   Hospital stay day # 7.   PMT has been consulted to assist with goals of care conversation. Patient/Family face treatment option decisions, advanced directive decisions and anticipatory care needs.    Family face treatment option decision, advance directive decisions and anticipatory care needs.   Today's Discussion 06/13/2024   I have reviewed medical records including: EPIC notes: Reviewed prior PMT note from 06/12/2024, plan to pursue rehab at discharge however wife remains concerned due to prior poor experiences.  Reviewed TOC progress note from 06/12/2024: Patient planning for short-term nursing home at discharge for rehabilitation and strengthening.  Reviewed hospitalist note (Dr. CHRISTOBAL) from 06/12/2024 detailing plan of care, currently being  treated for multiple medical issues including sepsis.  Noting for deconditioning, debility and recurrent hospitalization questioning rehab potential, plans to continue PT OT aggressively.  Patient maintained his DNR limited.  Reviewed psychiatry consult note ( Dr. Lenard) from 06/11/2024, patient has been started on Abilify  and hydroxyzine  for depression and anxiety.  Reviewed to assess/track clinical course and prognostication.  MAR: Reviewed medicines that the patient received over the last 24 hours: Continues to be on IV antibiotic.  Received as needed oxycodone  10 mg today 9:29 AM. Available advanced directives in ACP: Declaration of Natural Death  Met with patient and patient's wife to discuss diagnosis prognosis, GOC, EOL wishes, disposition and options.  With his wife present.  Patient was visited at bedside today he was observed laying in bed in his Semi-Fowler's position, breathing spontaneously on room air.  The patient was minimally interactive, keeping his eyes closed for most of the assessment and providing limited verbal responses, making it difficult to assess his level of orientation.  He did not appear to be in acute distress and denied pain when questioned.  He was able to communicate his needs and, when asked about his current medical state, he replied not good.  The patient's wife reported that he has not been doing well over the past 3 days, with a noticeable overall decline.  She described 1 episode of vomiting last night frequent belching, and 2 additional episodes of vomiting this morning.  She expressed concern and some frustration regarding his ongoing decline, and noted that the attending physician is aware of the situation and is ordered an abdominal x-ray.  Discussion with the bedside RN revealed that the patient is very weak and deconditioned, with ongoing episodes of emesis.  Patient is currently n.p.o., and a radiologic study has been ordered as part of the ongoing  evaluation.  The patient's goals of care remain unchanged.  Continue treating reversible conditions and continue current medical interventions with the hope of clinical improvement, with ultimate goal of returning home with his wife.  His wife recognizes the patient's significant deconditioning and interested a short-term stay at a skilled nursing facility may be beneficial for rehabilitation prior to discharge home, although she voiced value concerns due to previous negative experiences  at the SNF.  During the visit, I updated the patient on his current medical interventions, and expressed the importance of ongoing communication regarding goals of care to ensure that all interventions remain aligned with his wishes and preferences.   Created space and opportunity for patient to explore thoughts feelings and fears regarding current medical situation.  Patient and his wife face treatment option decisions, advanced directive decisions and anticipatory care needs.   Questions and concerns addressed.  Palliative Support Provided.   Objective Assessment: Vital Signs Vitals:   06/13/24 0829 06/13/24 0833  BP:    Pulse:    Resp:    Temp:    SpO2: 99% (P) 99%    Intake/Output Summary (Last 24 hours) at 06/13/2024 0947 Last data filed at 06/12/2024 1400 Gross per 24 hour  Intake 240 ml  Output --  Net 240 ml   Last Weight  Most recent update: 06/13/2024  4:01 AM    Weight  119.7 kg (263 lb 14.3 oz)             Gen: Chronically ill-appearing, not in any acute distress HEENT: Mucous membranes are moist CV: Regular rate and rhythm PULM: Clear, diminished bilaterally ABD: Soft and nontender EXT: Edema noted to 4 extremities Neuro: Alert and oriented with periods of confusion  SUMMARY OF RECOMMENDATIONS    Code Status: Maintain DNR/DNI status Continue with current medical interventions, treat the treatable, allow time for outcomes and hopes for clinical improvement. Continue  to observe delirium precautions measures. Emphasized the importance of ongoing goals of care conversation between patient/family and the medical team to ensure medical interventions are aligned with patient's goals and preferences Palliative medicine team will continue to follow.   Symptom Management: Per Primary team Palliative medicine is available to assist as needed.     # Psychosocial support Provided psychosocial support to patient and patient's wife.   # Discharge planning Plan for discharge to skilled nursing facility for short-term rehab.   I personally spent a total of 35 minutes in the care of the patient today including preparing to see the patient, getting/reviewing separately obtained history, performing a medically appropriate exam/evaluation, counseling and educating, placing orders, documenting clinical information in the EHR, and coordinating care.     ______________________________________________________________________________________ Kathlyne Bolder NP-C [place] Palliative Medicine Team Team Cell Phone: [phone] Please utilize secure chat with additional questions, if there is no response within 30 minutes please call the above phone number  Palliative Medicine Team providers are available by phone from 7am to 7pm daily and can be reached through the team cell phone.  Should this patient require assistance outside of these hours, please call the patient's attending physician.

## 2024-06-13 NOTE — Consult Note (Addendum)
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 175 mcg, 175 mcg, Nebulization, Daily, Payne, [name] D, PA-C, 175 mcg at 06/13/24 9170   sodium chloride  flush (NS) 0.9 % injection 10-40 mL, 10-40 mL, Intracatheter, Q12H, Gretta Leita P, DO, 10 mL at 06/13/24 0935   sodium chloride  flush (NS) 0.9 % injection 10-40 mL, 10-40 mL, Intracatheter, PRN, Gretta Leita SQUIBB, DO   vancomycin  (VANCOCIN ) capsule 125 mg, 125 mg, Oral, Daily, Gretta Leita SQUIBB, DO, 125 mg at 06/13/24 9070  Allergies: Allergies  Allergen Reactions   Brimonidine Tartrate Shortness Of Breath    Alphagan-Shortness of breath   Brinzolamide Shortness Of Breath    AZOPT- Shortness of breath   Latanoprost Shortness Of  Breath    XALATAN- Shortness of breath   Nucynta  [Tapentadol ] Shortness Of Breath   Sulfa Antibiotics Palpitations   Timolol Maleate Shortness Of Breath and Other (See Comments)    TIMOPTIC- Aggravated asthma   Diltiazem Swelling     leg swelling   Rofecoxib Swelling     VIOXX- leg swelling   Vancomycin  Rash    Tolerates oral vanc   Codeine Other (See Comments)    Childhood reaction   Tamsulosin Other (See Comments)    Dizziness    Celecoxib Other (See Comments)    CELLBREX-confusion   Colchicine Diarrhea    diarrhea   Tape Rash    Objective  Vital signs:  Temp:  [98.6 F (37 C)-99.4 F (37.4 C)] 98.6 F (37 C) (12/12 0800) Pulse Rate:  [97-102]  97 (12/12 0800) Resp:  [11-26] 11 (12/12 0800) BP: (106-127)/(58-64) 127/63 (12/12 0800) SpO2:  [94 %-99 %] (P) 99 % (12/12 0833) Weight:  [119.7 kg] 119.7 kg (12/12 0300)  Psychiatric Specialty Exam:  Presentation  General Appearance: Appropriate for Environment  Eye Contact:Minimal  Speech:Slow  Speech Volume:Decreased  Handedness:No data recorded  Mood and Affect  Mood:Euthymic  Affect:Tearful; Constricted   Thought Process  Thought Processes:Coherent  Descriptions of Associations:Intact  Orientation:Other (comment) (Patient more drowsy this morning,)  Thought Content:WDL  History of Schizophrenia/Schizoaffective disorder:No data recorded Duration of Psychotic Symptoms:No data recorded Hallucinations:Hallucinations: None   Ideas of Reference:None  Suicidal Thoughts:Suicidal Thoughts: No   Homicidal Thoughts:Homicidal Thoughts: No    Sensorium  Memory:Immediate Fair  Judgment:Fair  Insight:Fair   Executive Functions  Concentration:Poor  Attention Span:Poor  Recall:Fair  Fund of Knowledge:Fair  Language:Fair   Psychomotor Activity  Psychomotor Activity:Psychomotor Activity: -- (Patient nodding head back in forth)    Assets  Assets:Desire for Improvement; Housing; Social Support   Sleep  Sleep:Sleep: Fair   Physical Exam: Physical Exam Constitutional:      General: He is not in acute distress.    Appearance: He is ill-appearing.  Pulmonary:     Effort: Pulmonary effort is normal.  Neurological:     Mental Status: He is alert.    Review of Systems  Gastrointestinal:  Positive for nausea and vomiting.  Psychiatric/Behavioral:  Negative for depression, hallucinations and suicidal ideas. The patient is not nervous/anxious and does not have insomnia.    Blood pressure 127/63, pulse 97, temperature 98.6 F (37 C), temperature source Oral, resp. rate 11, height 6' 4 (1.93 m), weight 119.7 kg, SpO2 (P) 99%. Body mass index is  32.12 kg/m.

## 2024-06-13 NOTE — Progress Notes (Signed)
 Physical Therapy Treatment Patient Details Name: Tim [name] MRN: [id number] DOB: [birth year] Today's Date: 06/13/2024   History of Present Illness The pt is an 83 yo male admitted 12/4 with fever 103.7, found to have ESBL klebsiella ecoli bacteremia and UTI. Hospital stay complicated by hypotension requiring pressors and ICU, and acute metabolic encephalopathy. PMH includes: Cdiff 10/27-11/5 at [facility], HTN TAVR, hx of endocarditis, mitral valve lesion, AKI, CAD, PAF, CAD s/p PCI, complete heart block s/p PPM, HTN, a fib, DM, OSA, depression, AICD, glaucoma   PT Comments  Pt presents to therapy session with increased lethargy and limited verbalizations. Pt primarily kept his eyes closed and would intermittently follow commands for mobility. RN/wife reported that pt has been nauseous with frequent belching during session. Required TotalAx2 to roll to the left with pt not attempting to assist. TotalAx2 to reposition in the bed with pillow's propping UE's for comfort. Limited ability to perform LE exercises with pt grimacing with movement. Discussed performing PROM/AAROM on UE/LE's to tolerance with spouse at bedside. Continue to recommend <3hrs post acute rehab with acute PT to follow.     If plan is discharge home, recommend the following: Two people to help with walking and/or transfers;Two people to help with bathing/dressing/bathroom;Assistance with cooking/housework;Assistance with feeding;Direct supervision/assist for medications management;Direct supervision/assist for financial management;Assist for transportation;Help with stairs or ramp for entrance;Supervision due to cognitive status   Can travel by private vehicle     No  Equipment Recommendations  Wheelchair (measurements PT);Wheelchair cushion (measurements PT);Hospital bed;Hoyer lift       Precautions / Restrictions Precautions Precautions: Fall Recall of Precautions/Restrictions: Impaired Restrictions Weight Bearing  Restrictions Per Provider Order: No     Mobility  Bed Mobility Overal bed mobility: Needs Assistance Bed Mobility: Rolling Rolling: Total assist, +2 for physical assistance, +2 for safety/equipment    General bed mobility comments: TotalAx2 for all aspects with pt not assisting with mobility    Transfers  General transfer comment: Deferred 2/2 nausea, pain, and level of alertness        Balance Overall balance assessment: Needs assistance     Communication Communication Communication: Impaired Factors Affecting Communication: Hearing impaired;Reduced clarity of speech (limited verbalizations)  Cognition Arousal: Lethargic Behavior During Therapy: Flat affect, Anxious   PT - Cognitive impairments: Difficult to assess Difficult to assess due to: Level of arousal    PT - Cognition Comments: Primarily kept eyes closed with intermittent following of commands. Would not respond verbally to questions with pt occasionally calling out for his wife Following commands: Impaired Following commands impaired: Follows one step commands inconsistently, Only follows one step commands consistently    Cueing Cueing Techniques: Verbal cues, Tactile cues  Exercises General Exercises - Lower Extremity Ankle Circles/Pumps: AROM, Both, 20 reps, Supine Straight Leg Raises: AAROM, Both, Supine (3 reps)        Pertinent Vitals/Pain Pain Assessment Pain Assessment: Faces Faces Pain Scale: Hurts even more Pain Location: Back Pain Descriptors / Indicators: Discomfort, Grimacing, Crying, Moaning, Guarding Pain Intervention(s): Limited activity within patient's tolerance, Monitored during session, Repositioned, RN gave pain meds during session     PT Goals (current goals can now be found in the care plan section) Acute Rehab PT Goals PT Goal Formulation: With patient Time For Goal Achievement: 06/25/24 Potential to Achieve Goals: Fair Progress towards PT goals: Progressing toward goals     Frequency    Min 2X/week       AM-PAC PT 6 Clicks Mobility   Outcome Measure  Help needed turning from your back to your side while in a flat bed without using bedrails?: Total Help needed moving from lying on your back to sitting on the side of a flat bed without using bedrails?: Total Help needed moving to and from a bed to a chair (including a wheelchair)?: Total Help needed standing up from a chair using your arms (e.g., wheelchair or bedside chair)?: Total Help needed to walk in hospital room?: Total Help needed climbing 3-5 steps with a railing? : Total 6 Click Score: 6    End of Session   Activity Tolerance: No increased pain;Patient limited by fatigue;Patient limited by lethargy Patient left: in bed;with call bell/phone within reach;with bed alarm set;with family/visitor present Nurse Communication: Mobility status PT Visit Diagnosis: Muscle weakness (generalized) (M62.81);Difficulty in walking, not elsewhere classified (R26.2)     Time: 1130-1155 PT Time Calculation (min) (ACUTE ONLY): 25 min  Charges:    $Therapeutic Exercise: 8-22 mins $Therapeutic Activity: 8-22 mins PT General Charges $$ ACUTE PT VISIT: 1 Visit                    Tim ORN, PT, DPT Secure Chat Preferred  Rehab Office [phone]   Tim [name] 06/13/2024, 12:56 PM

## 2024-06-13 NOTE — Progress Notes (Signed)
 PROGRESS NOTE Tim [name]  FMW:[id number] DOB: Mar 30, 1941 DOA: 06/05/2024 PCP: [name], MD  Brief Narrative/Hospital Course: Tim [name] is a 83 y.o. 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 Patient has history of enterococcal infection of the AICD lead that is being treated with chronic amoxicillin .   Recent admission at [facility] on 10/9-10/17:mixed hypovolemic/septic shock. At [facility] on 10/29-11/1:[name] treated w/ vanc. Wife states he has not had diarrhea symptoms and off po vanc. Did have diarrhea on 12/3 and so did wife. Wife also states he has poor po intake.  On 12/4 overnight patient had high fever. EMS called found temp of 103.7. In [name] 99.3 F and wbc 6.7. LA 5. Chronic foley in place w/ hematuria which was replaced. UA inconclusive. INR 3.7. CXR unremarkable. Cultures obtained and started on broad spectrum abx. Despite fluids remains hypotensive started on levoPHED  12/5 admit to [facility] 12/6 spiked fever with Tmax of 100.7, blood culture is growing ESBL gram-negative rods with E. coli/Klebsiella.  Vasopressor requirement improving, Levophed  at 13, vasopressin  at 0.03 unit.  Foley catheter was exchanged by urology, no more hematuria 12/8 norepinephrine  requirements coming down.  Platelet count stabilized.  LFTs climbing compared to last 72 hours 12/10: Transferred to [facility] service  Subjective: Seen and examined Seen examined this morning patient remains weak lethargic very poor oral intake,Constant belching, vomited last night and again this morning x 2 Wife at the bedside Overnight remains afebrile BP stable on room air Labs reviewed stable electrolytes, he had significant leukocytosis and has resolved.  Assessment and plan:  Septic  shock-shock resolved Kleb pneumonia E coli bacteremia UTI-with history of BPH and chronic Foley, urine retention Leukemoid reaction with leukocytosis Hematuria due to traumatic Foley insertion resolved: Admitted with septic shock with multiple infection.  Shock resolved.  Now on low-dose midodrine  for support-SBP goal above 90 holding antihypertensive.  Blood culture from admission 12/4-5: E. coli and Klebsiella positive repeat blood culture 12/9 NGTD  Urine culture on admission E. coli positive Foley changed by urology.  Patient vitally stable afebrile and leukocytosis significantly improvng Continue meropenem  transitioning to amoxicillin  to 12/15 Continue contact precaution for ESBL monitor CBC. Patient had CBI, three-way hematuria catheter downsized by Dr Nieves  12/11-patient needs outpatient follow-up with Dr. Renda.  Consider voiding trial as he is more stable and is able to ambulate. long-term would consider if he is a candidate for suprapubic catheter. Recent Labs  Lab 06/09/24 0420 06/10/24 0410 06/11/24 0355 06/12/24 0500 06/13/24 1130  WBC 46.1* 24.0* 11.6* 9.7 8.6    Aortic stenosis status post TAVR. Last TEE in October 2025 showed ejection fraction 65 to 70%  Anasarca:Volume overload with net balance Holding home meds for now, received Lasix  intermittently.  But is vomiting poor oral intake will hold off on further Lasix   Hypomagnesemia: Add magnesium  oxide  Paroxysmal A-fib with RVR: H/o CHB, has PPM: Rate currently controlled.  Holding home Coreg and aspirin  in the setting of thrombocytopenia hypertension Needs PPM replacement Feb 2026  Thrombocytopenia:  likely 2/2 sepsis and antibiotics, and improving DVT prophylaxis remains on hold.   Anemia of chronic illness Monitor and transfuse for Hb <7   Hyperglycemia Stable, cont SSI PRN   Goals of care Deconditioning/debility Recurrent hospitalization Poor oral intake question rehab potential,  continue PT OT  aggressively.  Patient is DNR limited. PMT is consulted he has had very poor intake Created space and opportunity for patient to explore thoughts, feelings and fears regarding current medical situation I provided therapeutic listening.  Patient is not interacting much.  Wife is very overwhelmed with his medical condition and his overall decline.  Will continue to address goals of care continue to treat reversible causes.  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  Continue delirium precaution frequent reorientation PT OT and mobilize   Poor intake VomitingX2: Get Xray abd, prn antiemetics ppi. X-ray possible ileus versus gastroparesis, will add scheduled Reglan 3 times daily, psychiatry stopping Cymbalta  due to concern for gastric irritation.Starting Remeron bedtime given his poor oral intake.   H/o C diff Continue suppressive vancomycin  while on meropenem .  No diarrhea but constipation-small BM 12/11   Elevated transaminases: Likely in the setting of shock, resolving.     Suicidal ideation: Psych has been consulted continue Cymbalta , Atarax . Safety sitter has been discontinued    Class I Obesity w/ Body mass index is 32.12 kg/m.: Will benefit with PCP follow-up, weight loss,healthy lifestyle and outpatient sleep eval if not done.  Left upper extremity single-lumen  midline 12/9 Foley in place   FEN: Continue dysphagia 3 diet, RD eval  Mobility: PT Orders: Active PT Follow up Rec: Skilled Nursing-Short Term Rehab (<3 Hours/Day)06/13/2024 1247   DVT prophylaxis: SCDs Start: 06/06/24 0316 Code Status:   Code Status: Limited: Do not attempt resuscitation (DNR) -DNR-LIMITED -Do Not Intubate/DNI  Family  Communication: plan of care discussed with patient/wife at bedside. Patient status is: Remains hospitalized because of severity of illness Level of care: Progressive   Dispo: The patient is from: HOME            Anticipated disposition: Hopefully SNF after completing IV meropenem    Objective: Vitals last 24 hrs: Vitals:   06/13/24 0800 06/13/24 0829 06/13/24 0833 06/13/24 1200  BP: 127/63   (!) 122/103  Pulse: 97   99  Resp: 11   13  Temp: 98.6 F (37 C)   98.4 F (36.9 C)  TempSrc: Oral   Oral  SpO2:  99% (P) 99%   Weight:      Height:       Physical Examination: General exam: AA, oriented mildly confused, not in distress, obese HEENT:Oral mucosa moist, Ear/Nose WNL grossly Respiratory system: Bilaterally diminished BS,no use of accessory muscle Cardiovascular system: S1 & S2 +, No JVD. Gastrointestinal system: Abdomen soft,NT,ND, BS+ Nervous System: Alert, awake, moving all extremities,and following commands. Extremities: extremities warm, leg edema +, left TMA old Skin: Warm, no rashes MSK: Normal muscle bulk,tone, power   Medications reviewed:  Scheduled Meds:  [START ON 06/16/2024] amoxicillin   1,000 mg Oral BID   arformoterol   15 mcg Nebulization Q12H   ARIPiprazole   1 mg Oral QHS   budesonide  (PULMICORT ) nebulizer solution  0.5 mg Nebulization BID   Chlorhexidine  Gluconate Cloth  6 each Topical Daily   collagenase   1 Application Topical Daily   famotidine   20 mg Oral BID   feeding supplement  237 mL Oral TID BM   gabapentin   100 mg Oral TID   Gerhardt's butt cream   Topical BID   insulin  aspart  0-6 Units  Subcutaneous TID AC & HS   lidocaine   1 patch Transdermal Q24H   lidocaine   1 Application Urethral Once   magnesium  oxide  400 mg Oral BID   metoCLOPramide (REGLAN) injection  10 mg Intravenous Q8H   midodrine   5 mg Oral TID WC   mirtazapine  15 mg Oral QHS   multivitamin with minerals  1 tablet Oral Daily   revefenacin   175 mcg Nebulization Daily    sodium chloride  flush  10-40 mL Intracatheter Q12H   vancomycin   125 mg Oral Daily   Continuous Infusions:  meropenem  (MERREM ) IV 1 g (06/13/24 0927)   Diet: Diet Order             DIET DYS 3 Room service appropriate? Yes with Assist; Fluid consistency: Thin  Diet effective now                   Data Reviewed: I have personally reviewed following labs and imaging studies ( see epic result tab) CBC: Recent Labs  Lab 06/09/24 0420 06/10/24 0410 06/10/24 1150 06/11/24 0355 06/12/24 0500 06/13/24 1130  WBC 46.1* 24.0*  --  11.6* 9.7 8.6  HGB 9.7* 9.0*  --  9.5* 10.1* 10.1*  HCT 29.0* 26.3*  --  28.4* 30.0* 30.3*  MCV 96.3 95.3  --  97.9 97.4 95.6  PLT 39* 32* 35* 46* 68* 111*   CMP: Recent Labs  Lab 06/09/24 0645 06/10/24 0410 06/11/24 0355 06/12/24 0500 06/13/24 0656  NA 139 138 139 138 137  K 3.7 3.6 4.0 3.6 4.1  CL 104 105 105 102 103  CO2 26 22 30 30 23   GLUCOSE 111* 100* 90 114* 138*  BUN 25* 22 19 16 16   CREATININE 0.81 0.61 0.81 0.46* 0.66  CALCIUM  8.1* 7.8* 7.7* 7.8* 7.7*  MG 2.1 1.8 1.7 1.7 1.5*   GFR: Estimated Creatinine Clearance: 97.2 mL/min (by C-G formula based on SCr of 0.66 mg/dL). Recent Labs  Lab 06/07/24 0357 06/08/24 0435 06/09/24 0645 06/10/24 0410 06/10/24 1150 06/11/24 0355  AST 274* 242* 523* 370*  --  202*  ALT 110* 125* 238* 256*  --  196*  ALKPHOS 81 103 198* 218*  --  212*  BILITOT 1.1 1.5* 1.5* 1.1 1.1 1.1  PROT 4.9* 5.1* 4.8* 4.5*  --  4.4*  ALBUMIN 2.1* 2.1* 1.9* 1.8*  --  1.8*   No results for input(s): LIPASE, AMYLASE in the last 168 hours.  Recent Labs  Lab 06/11/24 0355  AMMONIA <13   Coagulation Profile:  Recent Labs  Lab 06/07/24 0357 06/08/24 1013 06/10/24 1150  INR 2.3* 1.2 1.2   Unresulted Labs (From admission, onward)     Start     Ordered   06/14/24 0500  Basic metabolic panel with GFR  Tomorrow morning,   R       Question:  Specimen collection method  Answer:  Unit=Unit collect   06/13/24  0855   06/14/24 0500  CBC  Tomorrow morning,   R       Question:  Specimen collection method  Answer:  Unit=Unit collect   06/13/24 0855   06/07/24 0500  Magnesium   Daily,   R     Question:  Specimen collection method  Answer:  Lab=Lab collect   06/06/24 1126           Antimicrobials/Microbiology: Anti-infectives (From admission, onward)    Start     Dose/Rate Route Frequency Ordered Stop   06/16/24 1000  amoxicillin  (AMOXIL ) capsule 1,000 mg        1,000 mg Oral 2 times daily 06/09/24 1406     06/10/24 1000  vancomycin  (VANCOCIN ) capsule 125 mg        125 mg Oral Daily 06/09/24 1406 06/18/24 0959   06/09/24 1600  meropenem  (MERREM ) 1 g in sodium chloride  0.9 % 100 mL IVPB        1 g 200 mL/hr over 30 Minutes Intravenous Every 8 hours 06/09/24 1406 06/16/24 0759   06/07/24 1000  vancomycin  (VANCOCIN ) capsule 125 mg  Status:  Discontinued        125 mg Oral Daily 06/06/24 1432 06/09/24 1406   06/06/24 1000  linezolid  (ZYVOX ) IVPB 600 mg  Status:  Discontinued        600 mg 300 mL/hr over 60 Minutes Intravenous Every 12 hours 06/06/24 0704 06/06/24 1432   06/06/24 0800  ceFEPIme  (MAXIPIME ) 2 g in sodium chloride  0.9 % 100 mL IVPB  Status:  Discontinued        2 g 200 mL/hr over 30 Minutes Intravenous Every 8 hours 06/06/24 0311 06/06/24 0704   06/06/24 0800  meropenem  (MERREM ) 1 g in sodium chloride  0.9 % 100 mL IVPB  Status:  Discontinued        1 g 200 mL/hr over 30 Minutes Intravenous Every 8 hours 06/06/24 0712 06/09/24 1406   06/06/24 0600  vancomycin  (VANCOCIN ) capsule 500 mg  Status:  Discontinued        500 mg Oral Every 6 hours 06/06/24 0310 06/06/24 1432   06/06/24 0400  metroNIDAZOLE  (FLAGYL ) IVPB 500 mg  Status:  Discontinued        500 mg 100 mL/hr over 60 Minutes Intravenous Every 8 hours 06/06/24 0310 06/06/24 0705   06/06/24 0200  vancomycin  (VANCOREADY) IVPB 2000 mg/400 mL  Status:  Discontinued        2,000 mg 100 mL/hr over 240 Minutes Intravenous  Once  06/06/24 0153 06/06/24 0705   06/06/24 0145  ampicillin  (OMNIPEN) 2 g in sodium chloride  0.9 % 100 mL IVPB        2 g 300 mL/hr over 20 Minutes Intravenous  Once 06/06/24 0139 06/06/24 0328   06/06/24 0000  ceFEPIme  (MAXIPIME ) 2 g in sodium chloride  0.9 % 100 mL IVPB        2 g 200 mL/hr over 30 Minutes Intravenous  Once 06/05/24 2358 06/06/24 0118         Component Value Date/Time   SDES BLOOD RIGHT HAND 06/10/2024 1813   SPECREQUEST  06/10/2024 1813    BOTTLES DRAWN AEROBIC AND ANAEROBIC Blood Culture results may not be optimal due to an inadequate volume of blood received in culture bottles   CULT  06/10/2024 1813    NO GROWTH 3 DAYS Performed at [name], [address], [place], [place] [address]    REPTSTATUS PENDING 06/10/2024 1813    Procedures:    Mennie LAMY, MD [facility] 06/13/2024, 1:38 PM

## 2024-06-14 ENCOUNTER — Inpatient Hospital Stay (HOSPITAL_COMMUNITY)

## 2024-06-14 DIAGNOSIS — F4329 Adjustment disorder with other symptoms: Secondary | ICD-10-CM

## 2024-06-14 DIAGNOSIS — F22 Delusional disorders: Secondary | ICD-10-CM

## 2024-06-14 DIAGNOSIS — F419 Anxiety disorder, unspecified: Secondary | ICD-10-CM

## 2024-06-14 DIAGNOSIS — F329 Major depressive disorder, single episode, unspecified: Secondary | ICD-10-CM

## 2024-06-14 LAB — GLUCOSE, CAPILLARY
Glucose-Capillary: 115 mg/dL — ABNORMAL HIGH (ref 70–99)
Glucose-Capillary: 139 mg/dL — ABNORMAL HIGH (ref 70–99)
Glucose-Capillary: 156 mg/dL — ABNORMAL HIGH (ref 70–99)
Glucose-Capillary: 126 mg/dL — ABNORMAL HIGH (ref 70–99)

## 2024-06-14 LAB — BASIC METABOLIC PANEL WITH GFR
Anion gap: 7 (ref 5–15)
BUN: 14 mg/dL (ref 8–23)
CO2: 28 mmol/L (ref 22–32)
Calcium: 8 mg/dL — ABNORMAL LOW (ref 8.9–10.3)
Chloride: 106 mmol/L (ref 98–111)
Creatinine, Ser: 0.55 mg/dL — ABNORMAL LOW (ref 0.61–1.24)
GFR, Estimated: >60 mL/min (ref >60)
Glucose, Bld: 106 mg/dL — ABNORMAL HIGH (ref 70–99)
Potassium: 3.3 mmol/L — ABNORMAL LOW (ref 3.5–5.1)
Sodium: 141 mmol/L (ref 135–145)

## 2024-06-14 LAB — CBC
HCT: 29.6 % — ABNORMAL LOW (ref 39.0–52.0)
Hemoglobin: 9.8 g/dL — ABNORMAL LOW (ref 13.0–17.0)
MCH: 32.5 pg (ref 26.0–34.0)
MCHC: 33.1 g/dL (ref 30.0–36.0)
MCV: 98 fL (ref 80.0–100.0)
Platelets: 123 K/uL — ABNORMAL LOW (ref 150–400)
RBC: 3.02 MIL/uL — ABNORMAL LOW (ref 4.22–5.81)
RDW: 15.5 % (ref 11.5–15.5)
WBC: 9.1 K/uL (ref 4.0–10.5)
nRBC: 0 % (ref 0.0–0.2)

## 2024-06-14 LAB — MAGNESIUM: Magnesium: 1.4 mg/dL — ABNORMAL LOW (ref 1.7–2.4)

## 2024-06-14 MED ORDER — ROSUVASTATIN CALCIUM 20 MG PO TABS
20.0000 mg | ORAL_TABLET | Freq: Every day | ORAL | Status: DC
  Administered 2024-06-14 – 2024-06-19 (×6): 20 mg via ORAL
  Filled 2024-06-14 (×6): qty 1

## 2024-06-14 MED ORDER — TORSEMIDE 20 MG PO TABS
20.0000 mg | ORAL_TABLET | Freq: Every day | ORAL | Status: DC
  Administered 2024-06-14 – 2024-06-25 (×12): 20 mg via ORAL
  Filled 2024-06-14 (×12): qty 1

## 2024-06-14 MED ORDER — MAGNESIUM SULFATE 2 GM/50ML IV SOLN
2.0000 g | Freq: Once | INTRAVENOUS | Status: AC
  Administered 2024-06-14: 2 g via INTRAVENOUS
  Filled 2024-06-14: qty 50

## 2024-06-14 MED ORDER — ACETAMINOPHEN 325 MG PO TABS
650.0000 mg | ORAL_TABLET | Freq: Three times a day (TID) | ORAL | Status: DC
  Administered 2024-06-14 – 2024-06-19 (×15): 650 mg via ORAL
  Filled 2024-06-14 (×15): qty 2

## 2024-06-14 MED ORDER — BOOST / RESOURCE BREEZE PO LIQD CUSTOM
1.0000 | Freq: Three times a day (TID) | ORAL | Status: DC
  Administered 2024-06-14 – 2024-06-16 (×6): 1 via ORAL
  Filled 2024-06-14: qty 1

## 2024-06-14 MED ORDER — EZETIMIBE 10 MG PO TABS
10.0000 mg | ORAL_TABLET | Freq: Every day | ORAL | Status: DC
  Administered 2024-06-14 – 2024-06-19 (×6): 10 mg via ORAL
  Filled 2024-06-14 (×6): qty 1

## 2024-06-14 MED ORDER — POTASSIUM CHLORIDE 10 MEQ/100ML IV SOLN
10.0000 meq | INTRAVENOUS | Status: AC
  Administered 2024-06-14 (×2): 10 meq via INTRAVENOUS
  Filled 2024-06-14 (×2): qty 100

## 2024-06-14 MED ORDER — THIAMINE MONONITRATE 100 MG PO TABS
100.0000 mg | ORAL_TABLET | Freq: Every day | ORAL | Status: DC
  Administered 2024-06-14 – 2024-06-19 (×6): 100 mg via ORAL
  Filled 2024-06-14 (×6): qty 1

## 2024-06-14 MED ORDER — HYDROMORPHONE HCL 1 MG/ML IJ SOLN
0.5000 mg | Freq: Once | INTRAMUSCULAR | Status: AC
  Administered 2024-06-14: 0.5 mg via INTRAVENOUS
  Filled 2024-06-14: qty 0.5

## 2024-06-14 MED ORDER — RANOLAZINE ER 500 MG PO TB12
500.0000 mg | ORAL_TABLET | Freq: Two times a day (BID) | ORAL | Status: DC
  Administered 2024-06-14 – 2024-06-19 (×11): 500 mg via ORAL
  Filled 2024-06-14 (×11): qty 1

## 2024-06-14 MED ADMIN — Metoclopramide HCl Inj 5 MG/ML (Base Equivalent): 10 mg | INTRAVENOUS | NDC 00409341421

## 2024-06-14 MED ADMIN — THERA M PLUS PO TABS: 1 | ORAL | NDC 1650058694

## 2024-06-14 NOTE — Plan of Care (Signed)
°  Problem: Education: Goal: Knowledge of General Education information will improve Description: Including pain rating scale, medication(s)/side effects and non-pharmacologic comfort measures Outcome: Progressing   Problem: Health Behavior/Discharge Planning: Goal: Ability to manage health-related needs will improve Outcome: Progressing   Problem: Clinical Measurements: Goal: Ability to maintain clinical measurements within normal limits will improve 06/14/2024 0123 by Jens Harland GRADE, RN Outcome: Progressing 06/14/2024 0120 by Jens Harland GRADE, RN Outcome: Progressing Goal: Will remain free from infection 06/14/2024 0123 by Jens Harland GRADE, RN Outcome: Progressing 06/14/2024 0120 by Jens Harland GRADE, RN Outcome: Progressing Goal: Diagnostic test results will improve Outcome: Progressing Goal: Cardiovascular complication will be avoided Outcome: Progressing   Problem: Activity: Goal: Risk for activity intolerance will decrease 06/14/2024 0123 by Jens Harland GRADE, RN Outcome: Progressing 06/14/2024 0120 by Jens Harland GRADE, RN Outcome: Progressing   Problem: Elimination: Goal: Will not experience complications related to bowel motility Outcome: Progressing

## 2024-06-14 NOTE — Progress Notes (Addendum)
 PROGRESS NOTE Tim [name]  FMW:[id number] DOB: [birth year] DOA: 06/05/2024 PCP: [name], MD  Brief Narrative/Hospital Course: Tim [name] is a 83 y.o. 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 Patient has history of enterococcal infection of the AICD lead that is being treated with chronic amoxicillin .   Recent admission at [facility] on 10/9-10/17:mixed hypovolemic/septic shock. At [facility] on 10/29-11/1:[name] treated w/ vanc. Wife states he has not had diarrhea symptoms and off po vanc. Did have diarrhea on 12/3 and so did wife. Wife also states he has poor po intake.  On 12/4 overnight patient had high fever. EMS called found temp of 103.7. In [name] 99.3 F and wbc 6.7. LA 5. Chronic foley in place w/ hematuria which was replaced. UA inconclusive. INR 3.7. CXR unremarkable. Cultures obtained and started on broad spectrum abx. Despite fluids remains hypotensive started on levoPHED  12/5 admit to [facility] 12/6 spiked fever with Tmax of 100.7, blood culture is growing ESBL gram-negative rods with E. coli/Klebsiella.  Vasopressor requirement improving, Levophed  at 13, vasopressin  at 0.03 unit.  Foley catheter was exchanged by urology, no more hematuria 12/8 norepinephrine  requirements coming down.  Platelet count stabilized.  LFTs climbing compared to last 72 hours 12/10: Transferred to [name] service 12-11/12 having episodes of vomiting x-ray shows ileus versus gastroparesis  Subjective: Seen and examined today morning He is more alert awake today although intermittent confusion.  Wife not at the bedside Overnight remains afebrile vital stable on room air Labs with mild hypokalemia 3.3 and CBC with chronic anemia platelet at 123 up mag low  1.4 X-ray abdomen  pending, refused during night No more nausea vomiting had a bowel movement yesterday and he endorsed passing gas  Assessment and plan:  Septic shock-shock resolved Kleb pneumonia E coli bacteremia UTI-with history of BPH and chronic Foley, urine retention Leukemoid reaction with leukocytosis Hematuria due to traumatic Foley insertion resolved: Admitted with septic shock with multiple infection.  Shock resolved.  Now on low-dose midodrine  for support-SBP goal above 90 holding antihypertensive.  Blood culture from admission 12/4-5: E. coli and Klebsiella positive repeat blood culture 12/9 NGTD  Urine culture on admission E. coli positive Foley changed by urology.  Patient vitally stable afebrile and leukocytosis significantly improvng Continue meropenem  transitioning to amoxicillin  to 12/15 Continue contact precaution for ESBL monitor CBC. Patient had CBI, three-way hematuria catheter downsized by Dr Nieves  12/11-patient needs outpatient follow-up with Dr. Renda.  Consider voiding trial as he is more stable and is able to ambulate. long-term would consider if he is a candidate for suprapubic catheter. Recent Labs  Lab 06/10/24 0410 06/11/24 0355 06/12/24 0500 06/13/24 1130 06/14/24 0249  WBC 24.0* 11.6* 9.7 8.6 9.1    Aortic stenosis status post TAVR. Last TEE in October 2025 showed ejection fraction 65 to 70%  Anasarca:Volume overload with net balance Holding home meds for now, received Lasix  intermittently.  But is vomiting poor oral intake will hold off on further Lasix   Poor intake VomitingX2 on 12/12 Possible ileus versus gastroparesis on imaging: X-ray possible ileus versus gastroparesis on 12/12- added on Reglan  12/12 psychiatry stopped Cymbalta  due to concern for gastric irritation.Starting Remeron  bedtime given his poor oral intake.   Continue liquid diet and ADAT, as no more nausea vomiting and having bowel  movement, repeat x-ray  pending  Hypokalemia Hypomagnesemia: Continue to replace oral and IV   Paroxysmal A-fib with RVR: H/o CHB, has PPM History of hypertension-his Coreg amlodipine losartan  has been on hold since October: Rate currently controlled.Holding home Coreg and aspirin  in the setting of thrombocytopenia.  His Eliquis  has been on hold since October per wife by Dr Renda due to history of fall and also hematuria. Needs PPM replacement Feb 2026  Thrombocytopenia:  likely 2/2 sepsis and antibiotics, and improving DVT prophylaxis remains on hold.   Anemia of chronic illness Monitor and transfuse for Hb <7   Hyperglycemia Stable, cont SSI PRN   Goals of care Deconditioning/debility Recurrent hospitalization Poor oral intake question rehab potential, continue PT OT aggressively.  Patient is DNR limited. PMT is consulted he has had very poor intake Created space and opportunity for patient to explore thoughts, feelings and fears regarding current medical situation Previously provided therapeutic listening.Wife is very overwhelmed with his medical condition and his overall decline. Will continue to address goals of care continue to treat reversible causes.  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  Mental status fairly stable but he is very forgetful.   Continue delirium precaution frequent reorientation PT OT and mobilize   Chronic back pain on chronic opiates: Patient reports no surgeon could fix his back, PTA on oxycodone  20 mg prn, gabapentin  800.  Continue pain meds and low-dose gabapentin  as tolerated  H/o C diff Continue suppressive vancomycin  while on meropenem .  No diarrhea but constipation-small BM 12/11    Elevated transaminases: Likely in the setting of shock, resolving.     Suicidal ideation: Psych has been consulted continue Cymbalta , Atarax . Safety sitter has been discontinued    Class I Obesity w/ Body mass index is 30.57 kg/m.: Will benefit with PCP follow-up, weight loss,healthy lifestyle and outpatient sleep eval if not done.  Left upper extremity single-lumen  midline 12/9 Foley in place   FEN: Continue dysphagia 3 diet, RD eval  Mobility: PT Orders: Active PT Follow up Rec: Skilled Nursing-Short Term Rehab (<3 Hours/Day)06/13/2024 1247   DVT prophylaxis: SCDs Start: 06/06/24 0316 Code Status:   Code Status: Limited: Do not attempt resuscitation (DNR) -DNR-LIMITED -Do Not Intubate/DNI  Family Communication: plan of care discussed with patient/wife not at bedside.  I called and updated her Patient status is: Remains hospitalized because of severity of illness Level of care: Progressive   Dispo: The patient is from: HOME            Anticipated disposition: Hopefully SNF after completing IV meropenem    Objective: Vitals last 24 hrs: Vitals:   06/14/24 0002 06/14/24 0245 06/14/24 0803 06/14/24 0824  BP: 116/69 124/64  118/66  Pulse: 99 97 99 (!) 102  Resp: 12 20  18   Temp:  98.4 F (36.9 C)  98.4 F (36.9 C)  TempSrc:  Oral  Oral  SpO2: 94% 94%  100%  Weight:  113.9 kg    Height:       Physical Examination: General exam: Alert awake oriented to self place, appears very sick looking elderly frail and obese  HEENT:Oral mucosa moist, Ear/Nose WNL grossly Respiratory system: Bilaterally diminished BS,no use of accessory muscle Cardiovascular system: S1 & S2 +, No JVD. Gastrointestinal system: Abdomen soft, obese nontender  nondistended, BS+ Nervous System: Alert, awake, moving all extremities,and following commands. Extremities: extremities warm, leg edema +, left TMA old Skin: Warm, no rashes MSK: Normal muscle bulk,tone, power   Medications reviewed:   Scheduled Meds:  acetaminophen   650 mg Oral TID   [START ON 06/16/2024] amoxicillin   1,000 mg Oral BID   arformoterol   15 mcg Nebulization Q12H   ARIPiprazole   1 mg Oral QHS   budesonide  (PULMICORT ) nebulizer solution  0.5 mg Nebulization BID   Chlorhexidine  Gluconate Cloth  6 each Topical Daily   collagenase   1 Application Topical Daily   ezetimibe   10 mg Oral Daily   famotidine   20 mg Oral BID   feeding supplement  1 Container Oral TID BM   gabapentin   100 mg Oral TID   Gerhardt's butt cream   Topical BID   insulin  aspart  0-6 Units Subcutaneous TID AC & HS   lidocaine   1 patch Transdermal Q24H   lidocaine   1 Application Urethral Once   magnesium  oxide  400 mg Oral BID   metoCLOPramide  (REGLAN ) injection  10 mg Intravenous Q8H   midodrine   5 mg Oral TID WC   mirtazapine   15 mg Oral QHS   multivitamin with minerals  1 tablet Oral Daily   ranolazine   500 mg Oral BID   revefenacin   175 mcg Nebulization Daily   rosuvastatin   20 mg Oral Daily   sodium chloride  flush  10-40 mL Intracatheter Q12H   thiamine   100 mg Oral Daily   torsemide   20 mg Oral Daily   vancomycin   125 mg Oral Daily   Continuous Infusions:  meropenem  (MERREM ) IV 1 g (06/14/24 0844)   Diet: Diet Order             Diet clear liquid Room service appropriate? No; Fluid consistency: Thin  Diet effective now                   Data Reviewed: I have personally reviewed following labs and imaging studies ( see epic result tab) CBC: Recent Labs  Lab 06/10/24 0410 06/10/24 1150 06/11/24 0355 06/12/24 0500 06/13/24 1130 06/14/24 0249  WBC 24.0*  --  11.6* 9.7 8.6 9.1  HGB 9.0*  --  9.5* 10.1* 10.1* 9.8*  HCT 26.3*  --  28.4* 30.0* 30.3* 29.6*  MCV 95.3  --  97.9 97.4 95.6 98.0  PLT 32* 35* 46* 68* 111* 123*   CMP: Recent Labs  Lab 06/10/24 0410 06/11/24 0355 06/12/24 0500 06/13/24 0656 06/14/24 0249  NA 138 139 138 137 141  K 3.6 4.0 3.6 4.1 3.3*  CL 105 105 102 103 106  CO2 22 30 30 23 28    GLUCOSE 100* 90 114* 138* 106*  BUN 22 19 16 16 14   CREATININE 0.61 0.81 0.46* 0.66 0.55*  CALCIUM  7.8* 7.7* 7.8* 7.7* 8.0*  MG 1.8 1.7 1.7 1.5* 1.4*   GFR: Estimated Creatinine Clearance: 94.9 mL/min (A) (by C-G formula based on SCr of 0.55 mg/dL (L)). Recent Labs  Lab 06/08/24 0435 06/09/24 0645 06/10/24 0410 06/10/24 1150 06/11/24 0355  AST 242* 523* 370*  --  202*  ALT 125* 238* 256*  --  196*  ALKPHOS 103 198* 218*  --  212*  BILITOT 1.5* 1.5* 1.1 1.1 1.1  PROT 5.1* 4.8* 4.5*  --  4.4*  ALBUMIN 2.1* 1.9* 1.8*  --  1.8*   No results for input(s): LIPASE, AMYLASE in the last 168 hours.  Recent Labs  Lab 06/11/24 0355  AMMONIA <13   Coagulation Profile:  Recent Labs  Lab 06/08/24 1013 06/10/24 1150  INR 1.2 1.2   Unresulted Labs (From admission, onward)     Start     Ordered   06/15/24 0500  Basic metabolic panel with GFR  Tomorrow morning,   R       Question:  Specimen collection method  Answer:  Unit=Unit collect   06/14/24 0827   06/15/24 0500  CBC  Tomorrow morning,   R       Question:  Specimen collection method  Answer:  Unit=Unit collect   06/14/24 0827   06/07/24 0500  Magnesium   Daily,   R     Question:  Specimen collection method  Answer:  Lab=Lab collect   06/06/24 1126           Antimicrobials/Microbiology: Anti-infectives (From admission, onward)    Start     Dose/Rate Route Frequency Ordered Stop   06/16/24 1000  amoxicillin  (AMOXIL ) capsule 1,000 mg        1,000 mg Oral 2 times daily 06/09/24 1406     06/10/24 1000  vancomycin  (VANCOCIN ) capsule 125 mg        125 mg Oral Daily 06/09/24 1406 06/18/24 0959   06/09/24 1600  meropenem  (MERREM ) 1 g in sodium chloride  0.9 % 100 mL IVPB        1 g 200 mL/hr over 30 Minutes Intravenous Every 8 hours 06/09/24 1406 06/16/24 0759   06/07/24 1000  vancomycin  (VANCOCIN ) capsule 125 mg  Status:  Discontinued        125 mg Oral Daily 06/06/24 1432 06/09/24 1406   06/06/24 1000  linezolid  (ZYVOX )  IVPB 600 mg  Status:  Discontinued        600 mg 300 mL/hr over 60 Minutes Intravenous Every 12 hours 06/06/24 0704 06/06/24 1432   06/06/24 0800  ceFEPIme  (MAXIPIME ) 2 g in sodium chloride  0.9 % 100 mL IVPB  Status:  Discontinued        2 g 200 mL/hr over 30 Minutes Intravenous Every 8 hours 06/06/24 0311 06/06/24 0704   06/06/24 0800  meropenem  (MERREM ) 1 g in sodium chloride  0.9 % 100 mL IVPB  Status:  Discontinued        1 g 200 mL/hr over 30 Minutes Intravenous Every 8 hours 06/06/24 0712 06/09/24 1406   06/06/24 0600  vancomycin  (VANCOCIN ) capsule 500 mg  Status:  Discontinued        500 mg Oral Every 6 hours 06/06/24 0310 06/06/24 1432   06/06/24 0400  metroNIDAZOLE  (FLAGYL ) IVPB 500 mg  Status:  Discontinued        500 mg 100 mL/hr over 60 Minutes Intravenous Every 8 hours 06/06/24 0310 06/06/24 0705   06/06/24 0200  vancomycin  (VANCOREADY) IVPB 2000 mg/400 mL  Status:  Discontinued        2,000 mg 100 mL/hr over 240 Minutes Intravenous  Once 06/06/24 0153 06/06/24 0705   06/06/24 0145  ampicillin  (OMNIPEN) 2 g in sodium chloride  0.9 % 100 mL IVPB        2 g 300 mL/hr over 20 Minutes Intravenous  Once 06/06/24 0139 06/06/24 0328   06/06/24 0000  ceFEPIme  (MAXIPIME ) 2 g in sodium chloride  0.9 % 100 mL IVPB        2 g 200 mL/hr over 30 Minutes Intravenous  Once 06/05/24 2358 06/06/24 0118         Component Value Date/Time  SDES BLOOD RIGHT HAND 06/10/2024 1813   SPECREQUEST  06/10/2024 1813    BOTTLES DRAWN AEROBIC AND ANAEROBIC Blood Culture results may not be optimal due to an inadequate volume of blood received in culture bottles   CULT  06/10/2024 1813    NO GROWTH 4 DAYS Performed at [facility], [address], [place], [place] [address]    REPTSTATUS PENDING 06/10/2024 1813    Procedures:    Tim LAMY, MD [facility] 06/14/2024, 2:39 PM

## 2024-06-14 NOTE — Progress Notes (Addendum)
 Nutrition Brief Note  Consult received to start calorie count. Please see note on 12/10 for full assessment. Reached out to RN to hang calorie count envelope and document intake. Pt has had nausea and vomiting recently,  placed on CLD for concern for Ileus vs gastroparesis. Per MD, wait for imaging today before starting back on diet. If pt continues to be on CLD recommend initiation of TPN. If  diet is advanced and po intake is still poor recommend nutrition support Monday. Discussed with MD. Pt at refeeding risk, monitor lytes, add Thiamine .    INTERVENTION:  Diet per MD - currently CLD for possible Ileus  Recommend Dysphagia 3 diet, thin liquids if no concerns on imaging  Order with assistance CLD: Boost Breeze po TID, each supplement provides 250 kcal and 9 grams of protein FLD: Ensure Plus High Protein po TID, each supplement provides 350 kcal and 20 grams of protein. QOI:Fjhpr cup TID with meals, each supplement provides 290 kcal and 9 grams of protein 48 hour calorie count per MD MVI with minerals daily 100 mg Thiamine  daily  Recommend cortrak placement Monday if diet advanced and pt still with poor po  If Ileus present and pt continues on CLD recommend initiation of TPN    NUTRITION DIAGNOSIS:    Moderate Malnutrition related to chronic illness (CAD, AS, endocarditis, HF, COPD) as evidenced by moderate fat depletion, moderate muscle depletion, edema.   GOAL:    Patient will meet greater than or equal to 90% of their needs  Olivia Kenning, RD Registered Dietitian  See Amion for more information

## 2024-06-14 NOTE — Consult Note (Signed)
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 06/14/24 0842   meropenem  (MERREM ) 1 g in sodium chloride  0.9 % 100 mL IVPB, 1 g, Intravenous, Q8H, Clark, Leita SQUIBB, DO, Last Rate: 200 mL/hr at 06/14/24 0844, 1 g at 06/14/24 0844   metoCLOPramide  (REGLAN ) injection 10 mg, 10 mg, Intravenous, Q8H, Kc, Ramesh, MD, 10 mg at 06/14/24 0510   midodrine  (PROAMATINE ) tablet 5 mg, 5 mg, Oral, TID WC, Babcock, Peter E, NP, 5 mg at 06/14/24 9158   mirtazapine  (REMERON  SOL-TAB) disintegrating tablet 15 mg, 15 mg, Oral, QHS, Lenard Calin, MD, 15 mg at 06/13/24 2207   multivitamin with minerals tablet 1 tablet,  1 tablet, Oral, Daily, Kc, Ramesh, MD, 1 tablet at 06/14/24 0841   ondansetron  (ZOFRAN ) injection 4 mg, 4 mg, Intravenous, Q6H PRN, Kc, Ramesh, MD, 4 mg at 06/13/24 1737   Oral care mouth rinse, 15 mL, Mouth Rinse, PRN, Chand, Sudham, MD   oxyCODONE  (Oxy IR/ROXICODONE ) immediate release tablet 10 mg, 10 mg, Oral, Q6H PRN, Macrohon, Erwin F Jr., NP, 10 mg at 06/14/24 0602   oxyCODONE  (Oxy IR/ROXICODONE ) immediate release tablet 5 mg, 5 mg, Oral, Q6H PRN, 5 mg at 06/11/24 1244 **OR** [DISCONTINUED] oxyCODONE  (Oxy IR/ROXICODONE ) immediate release tablet 10 mg, 10 mg, Oral, Q6H PRN, Paliwal, Aditya, MD, 10 mg at 06/13/24 0929   polyethylene glycol (MIRALAX  / GLYCOLAX ) packet 17 g, 17 g, Oral, Daily PRN, Payne, [name] D, PA-C    potassium chloride  10 mEq in 100 mL IVPB, 10 mEq, Intravenous, Q1 Hr x 2, Kc, Ramesh, MD, Last Rate: 100 mL/hr at 06/14/24 1039, 10 mEq at 06/14/24 1039   revefenacin  (YUPELRI ) nebulizer solution 175 mcg, 175 mcg, Nebulization, Daily, Payne, [name] D, PA-C, 175 mcg at 06/14/24 9196   sodium chloride  flush (NS) 0.9 % injection 10-40 mL, 10-40 mL, Intracatheter, Q12H, Gretta Doffing P, DO, 10 mL at 06/14/24 9157   sodium chloride  flush (NS) 0.9 % injection 10-40 mL, 10-40 mL, Intracatheter, PRN, Gretta Doffing SQUIBB, DO   vancomycin  (VANCOCIN ) capsule 125 mg, 125 mg, Oral, Daily, Gretta Doffing SQUIBB, DO, 125 mg at 06/14/24 0932  Allergies: Allergies  Allergen Reactions   Brimonidine Tartrate Shortness Of Breath    Alphagan-Shortness of breath   Brinzolamide Shortness Of Breath    AZOPT- Shortness of breath   Latanoprost Shortness Of Breath    XALATAN- Shortness of breath   Nucynta  [Tapentadol ] Shortness Of Breath   Sulfa Antibiotics Palpitations   Timolol Maleate Shortness Of Breath and Other (See Comments)    TIMOPTIC- Aggravated asthma   Diltiazem Swelling     leg swelling   Rofecoxib Swelling     VIOXX- leg swelling   Vancomycin  Rash    Tolerates oral vanc   Codeine Other (See Comments)    Childhood reaction   Tamsulosin Other (See Comments)    Dizziness    Celecoxib Other (See Comments)    CELLBREX-confusion   Colchicine Diarrhea    diarrhea   Tape Rash    Objective  Vital signs:  Temp:  [98.1 F (36.7 C)-98.6 F (37 C)] 98.4 F (36.9 C) (12/13 0824) Pulse Rate:  [97-102] 102 (12/13 0824) Resp:  [12-20] 18 (12/13 0824) BP: (116-129)/(64-103) 118/66 (12/13 0824) SpO2:  [94 %-100 %] 100 % (12/13 0824) Weight:  [113.9 kg] 113.9 kg (12/13 0245)  Psychiatric Specialty Exam:  Presentation  General Appearance: Appropriate for Environment  Eye Contact:Good Speech: WDL Speech Volume:WDL   Mood and Affect  Mood: some depression related to medical and functional  issues Affect:appropriate   Thought Process  Thought Processes:Coherent  Descriptions of Associations:Intact  Orientation: alert and oriented times three Thought Content:WDL  History of Schizophrenia/Schizoaffective disorder:No data recorded Duration of Psychotic Symptoms:No data recorded Hallucinations:Hallucinations: None   Ideas of Reference:None  Suicidal Thoughts:Suicidal Thoughts: No   Homicidal Thoughts:Homicidal Thoughts: No    Sensorium  Memory:Immediate Fair  Judgment:Fair  Insight:Fair   Executive Functions  Concentration: Good Attention Span:Good Recall: Fund of Knowledge:Good Language:Good  Psychomotor Activity  Psychomotor Activity:Psychomotor Activity: -- (Patient nodding head back in forth)    Assets  Assets:Desire for Improvement; Housing; Social Support   Sleep  Sleep: he  believes not good  Physical Exam: Physical Exam Constitutional:      General: He is not in acute distress.    Appearance: He is ill-appearing.  Pulmonary:     Effort: Pulmonary effort is normal.  Neurological:     General: No focal deficit present.     Mental Status: He is alert and oriented to person, place, and time.    Review of Systems  Constitutional:  Positive for malaise/fatigue.  HENT: Negative.    Eyes: Negative.   Respiratory: Negative.    Cardiovascular: Negative.   Gastrointestinal: Negative.   Genitourinary: Negative.   Musculoskeletal: Negative.   Skin: Negative.   Neurological:        C/o neuropathic pain  Endo/Heme/Allergies: Negative.   Psychiatric/Behavioral:  Negative for depression, hallucinations and suicidal ideas. The patient is not nervous/anxious and does not have insomnia.    Blood pressure 118/66, pulse (!) 102, temperature 98.4 F (36.9 C), temperature source Oral, resp. rate 18, height 6' 4 (1.93 m), weight 113.9 kg, SpO2 100%. Body mass index is 30.57 kg/m.

## 2024-06-14 NOTE — Progress Notes (Addendum)
 Palliative Medicine Inpatient Follow Up Note   HPI:  83 y.o. 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    Patient has history of enterococcal infection of the AICD lead that is being treated with chronic amoxicillin .  Patient recently admitted at [facility] from 10/9-10/17 with mixed hypovolemic/septic shock.  Also noted patient has been admitted to [facility] from 10/29-11/1 with [name] and was treated with vancomycin .  Wife states he has poor oral intake.  On 06/05/2024 overnight patient had high fever.  EMS called found temperature of 103.7.  Patient has chronic Foley in place with hematuria, Foley replaced.  Despite IV fluids remains hypotensive, therefore started on levo drip.  Transferred to ICU services for intensive care management.   Worth to note that patient has had 2 inpatient admissions in the last 6 months.   Hospital stay day # 8.   PMT has been consulted to assist with goals of care conversation. Patient/Family face treatment option decisions, advanced directive decisions and anticipatory care needs.    Patient and his family face treatment option decision, advance directive decisions and anticipatory care needs.   Today's Discussion 06/14/2024  I have reviewed medical records including:  EPIC notes: Reviewed hospitalist (Dr. Christobal)  progress notes from 06/13/2024 detailing treatment plan, noted for patient not doing well, continues to have poor oral intake with reports of vomiting x 2, x-ray showing possible ileus versus gastroparesis.  Reglan  3 times daily started.  Remeron  also started due to his poor oral intake.  Reviewed to track clinical course  and prognostication.  MAR: Reviewed PRN meds received over the last 24 hours.  Received a total of 30 mg of as needed oxycodone  in the last 24 hours to address pain.  Reviewed to assess needs for medication adjustment to optimize comfort.  Available advanced directives in ACP: Patient has AD documents on file. Labs: Reviewed CMP from 06/14/2024.  Noted for low potassium at 3.3, and low magnesium  at 1.4.  Electrolyte currently being replaced.  Reviewed to assist with prescribing and prognostication  Met with patient to re-discuss diagnosis prognosis, GOC, EOL wishes, disposition and options.  Visited patient at bedside. No family member present. Patient observed resting in bed, alert and oriented x 3. Patient reports not having a restful night but doing well this morning.  No current signs of acute distress or discomfort. Able to make needs known and engage in meaningful conversation, though appears fatigued. Reports chronic tolerable pain to his lower extremities.   The patient reports that he is doing well this morning and is attempting to finish his breakfast, although he notes that his appetite is not that good.  He denies any nausea or vomiting today and has not experienced belching or bloating, which were present yesterday.  He is unable to recall the events of yesterday but states that today feels like better today.  I informed him of the plan to obtain a follow-up abdominal x-ray to rule out any underlying pathology in light of his previous episode of nausea and vomiting.  He mentioned that he may experience pain with unnecessary movements as he always does. I discussed prescribing a single dose of IV hydromorphone  to help manage pain associated with movement and transfers.  He agreed.  We revisited his goals of care.  The patient expressed hope for continued improvement but acknowledges that recovery may be prolonged and could involve setbacks, including the possibility of recurrent  hospitalizations.  He understands the cyclical nature of his chronic conditions.  When discussing what matters most to him, he stated that in the event of progressive decline or severely compromised quality of life, he would prefer a comfort focused care rather than life-prolonging interventions that may cause additional pain and discomfort.  He reaffirmed his CODE STATUS is DNR limited.  The patient is open to short-term skilled nursing facility for PT OT if his condition stabilizes, though he voiced concerns due to previous negative SNF experience.  Ultimately, his primary goal is to return home and be with his wife.  He also expressed concern about the stress of his illness and hospitalization are causing his wife, stating, my wife is my life, been married for 30 something years, she is me everything and I worry about her.  Emotional support was provided.  At the conclusion of our discussion, we reviewed his goals of care, which remain unchanged.  The plan is to continue treating reversible conditions and maintain current medical interventions in hopes for improvement.  Should his condition stabilize, a short SNF stay for PT OT and outpatient palliative care discharge will be considered, with the ultimate goal of returning home with his wife.  Created space and opportunity for patient to explore thoughts feelings and fears regarding current medical situation.  Patient and his wife face treatment option decisions, advanced directive decisions and anticipatory care needs.   Questions and concerns addressed   Palliative Support Provided.   Objective Assessment: Vital Signs Vitals:   06/14/24 0803 06/14/24 0824  BP:  118/66  Pulse: 99 (!) 102  Resp:  18  Temp:  98.4 F (36.9 C)  SpO2:  100%    Intake/Output Summary (Last 24 hours) at 06/14/2024 0913 Last data filed at 06/14/2024 0330 Gross per 24 hour  Intake --  Output 650 ml  Net -650 ml   Last Weight  Most recent update:  06/14/2024  2:46 AM    Weight  113.9 kg (251 lb 1.7 oz)             Gen: Chronically ill-appearing HEENT: Mucous membranes are moist CV: Regular rhythm, however tachycardic heart rate of 102. PULM: Clear but diminished bilaterally ABD: Soft and nontender EXT: Noted with 3+ edema to the upper extremities Neuro: Alert and oriented with periods of confusion  SUMMARY OF RECOMMENDATIONS    # Complex medical decision-making/goals of care CODE STATUS: Maintain DNR/DNI Continue with current medical interventions, treat the treatable, allow time for outcomes and hopeful clinical improvement. Continue to observe delirium precautions Emphasized the importance of ongoing goals of care conversation with the patient/family and medical team to ensure medical interventions are aligned with patient's goals and preferences. Palliative medicine team will continue to follow  # Symptom management Per primary team Will change Tylenol  from as needed to scheduled 3 times a day to address pain and improve comfort (r/t his chronic BLE pain) Ordered 1x dose of Hydromorphone  0.5mg  to be given 154 to 30 minutes prior to patient's transport to radiology to address pain related to transfers.  Palliative medicine is available to assist with symptom management as needed.  # Psychosocial support Provided psychosocial emotional support to patient and his wife.   # Discharge planning To be determined, likely skilled nursing facility for short-term  rehab prior to discharge home.  Discussed plan of care with the patient, nursing, and the medical team.   I personally spent a total of 50 minutes in the care of the patient today including preparing to see the patient, getting/reviewing separately obtained history, performing a medically appropriate exam/evaluation, counseling and educating, placing orders, referring and communicating with other health care professionals, documenting clinical information in the EHR,  communicating results, and coordinating care.     ______________________________________________________________________________________ Kathlyne Bolder NP-C [place] Palliative Medicine Team Team Cell Phone: [phone] Please utilize secure chat with additional questions, if there is no response within 30 minutes please call the above phone number  Palliative Medicine Team providers are available by phone from 7am to 7pm daily and can be reached through the team cell phone.  Should this patient require assistance outside of these hours, please call the patient's attending physician.

## 2024-06-14 NOTE — Plan of Care (Signed)
°  Problem: Nutrition: Goal: Adequate nutrition will be maintained Outcome: Progressing   Problem: Elimination: Goal: Will not experience complications related to bowel motility Outcome: Progressing Goal: Will not experience complications related to urinary retention Outcome: Progressing   Problem: Pain Managment: Goal: General experience of comfort will improve and/or be controlled Outcome: Progressing   Problem: Skin Integrity: Goal: Risk for impaired skin integrity will decrease Outcome: Progressing   Problem: Safety: Goal: Ability to remain free from injury will improve Outcome: Progressing

## 2024-06-15 DIAGNOSIS — R6521 Severe sepsis with septic shock: Secondary | ICD-10-CM | POA: Diagnosis not present

## 2024-06-15 DIAGNOSIS — A419 Sepsis, unspecified organism: Secondary | ICD-10-CM | POA: Diagnosis not present

## 2024-06-15 LAB — CULTURE, BLOOD (ROUTINE X 2)
Culture: NO GROWTH
Culture: NO GROWTH

## 2024-06-15 LAB — BASIC METABOLIC PANEL WITH GFR
Anion gap: 7 (ref 5–15)
BUN: 13 mg/dL (ref 8–23)
CO2: 29 mmol/L (ref 22–32)
Calcium: 7.6 mg/dL — ABNORMAL LOW (ref 8.9–10.3)
Chloride: 103 mmol/L (ref 98–111)
Creatinine, Ser: 0.63 mg/dL (ref 0.61–1.24)
GFR, Estimated: >60 mL/min (ref >60)
Glucose, Bld: 85 mg/dL (ref 70–99)
Potassium: 3.4 mmol/L — ABNORMAL LOW (ref 3.5–5.1)
Sodium: 139 mmol/L (ref 135–145)

## 2024-06-15 LAB — GLUCOSE, CAPILLARY
Glucose-Capillary: 86 mg/dL (ref 70–99)
Glucose-Capillary: 142 mg/dL — ABNORMAL HIGH (ref 70–99)
Glucose-Capillary: 120 mg/dL — ABNORMAL HIGH (ref 70–99)
Glucose-Capillary: 357 mg/dL — ABNORMAL HIGH (ref 70–99)
Glucose-Capillary: 101 mg/dL — ABNORMAL HIGH (ref 70–99)

## 2024-06-15 LAB — CBC
HCT: 27.8 % — ABNORMAL LOW (ref 39.0–52.0)
Hemoglobin: 9.2 g/dL — ABNORMAL LOW (ref 13.0–17.0)
MCH: 32.6 pg (ref 26.0–34.0)
MCHC: 33.1 g/dL (ref 30.0–36.0)
MCV: 98.6 fL (ref 80.0–100.0)
Platelets: 106 K/uL — ABNORMAL LOW (ref 150–400)
RBC: 2.82 MIL/uL — ABNORMAL LOW (ref 4.22–5.81)
RDW: 15.4 % (ref 11.5–15.5)
WBC: 5.7 K/uL (ref 4.0–10.5)
nRBC: 0 % (ref 0.0–0.2)

## 2024-06-15 LAB — MAGNESIUM: Magnesium: 1.4 mg/dL — ABNORMAL LOW (ref 1.7–2.4)

## 2024-06-15 MED ORDER — MAGNESIUM SULFATE 4 GM/100ML IV SOLN
4.0000 g | Freq: Once | INTRAVENOUS | Status: AC
  Administered 2024-06-15: 4 g via INTRAVENOUS
  Filled 2024-06-15: qty 100

## 2024-06-15 MED ORDER — ENOXAPARIN SODIUM 60 MG/0.6ML IJ SOSY
60.0000 mg | PREFILLED_SYRINGE | INTRAMUSCULAR | Status: DC
  Administered 2024-06-15 – 2024-06-19 (×5): 60 mg via SUBCUTANEOUS
  Filled 2024-06-15 (×5): qty 0.6

## 2024-06-15 MED ADMIN — Metoclopramide HCl Inj 5 MG/ML (Base Equivalent): 10 mg | INTRAVENOUS | NDC 00409341421

## 2024-06-15 MED ADMIN — THERA M PLUS PO TABS: 1 | ORAL | NDC 1650058694

## 2024-06-15 NOTE — Progress Notes (Signed)
 PHARMACY - ANTICOAGULATION CONSULT NOTE  Pharmacy Consult for lovenox  Indication: VTE prophylaxis  Allergies[1]  Patient Measurements: Height: 6' 4 (193 cm) Weight: 118.2 kg (260 lb 9.3 oz) IBW/kg (Calculated) : 86.8 HEPARIN  DW (KG): 111.9  Vital Signs: Temp: 97.8 F (36.6 C) (12/14 1149) Temp Source: Oral (12/14 1149) BP: 112/55 (12/14 1149) Pulse Rate: 95 (12/14 1149)  Labs: Recent Labs    06/13/24 0656 06/13/24 1130 06/13/24 1130 06/14/24 0249 06/15/24 0330  HGB  --  10.1*   < > 9.8* 9.2*  HCT  --  30.3*  --  29.6* 27.8*  PLT  --  111*  --  123* 106*  CREATININE 0.66  --   --  0.55* 0.63   < > = values in this interval not displayed.    Estimated Creatinine Clearance: 96.6 mL/min (by C-G formula based on SCr of 0.63 mg/dL).   Medical History: Past Medical History:  Diagnosis Date   AICD (automatic cardioverter/defibrillator) present    Balanitis    +severe phimosis+ buried penis->circ not possible but dorsal slit done 10/2019   BPH (benign prostatic hypertrophy)    with urinary retention.  Renal u/s 12/04/19 NORMAL KIDNEYS, NORMAL BLADDER, NO HYDRONEPHROSIS   CAD (coronary artery disease)    Nonobstructive by 12/2012 cath; then 03/2016 he required BMS to RCA ([facility]).  In-stent restenosis on cath 11/01/16, baloon angioplasty successful.   CATARACT, HX OF    Chronic combined systolic and diastolic CHF (congestive heart failure) (HCC) 05/2016   Ischemic CM.  04/2018 EF 40-45%, grd I DD.  07/2021 EF 55-60%, AV well seated   Chronic pain syndrome    Lumbar DDD; chronic neuropathic pain (DM); has spinal stimulator and sees pain mgmt MD   Complicated UTI (urinary tract infection) 09/2019   Phimoses, acute urinary retention, entoerococcus UTI, enterococc bacteremia.  F/u blood clx's neg x 5d.     COPD    Debilitated patient    Diabetes mellitus type 2 with complications (HCC)    HbA1c jumped from 5.7% to 6.1% 03/2015---started metformin  at that time.  DM 2 dx by fasting  gluc criteria 2018.  Has chronic neuropath pain   Enterococcal bacteremia 09/2019   Phimoses, acute urinary retention, entoerococcus UTI, enterococc bacteremia.  F/u blood clx's neg x 5d.     Essential hypertension, benign    Fatty liver 2007   2007 u/s showed fatty liver with hepatosplenomegaly.  2019 repeat u/s->fatty liver but no cirrhosis or hepatosplenomegaly.   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 Fleeta Rothman)   Iron deficiency anemia    09/2022.  Hemoccults x 3 neg Mar 2024.  Improving with oral iron. GI following, no endoscopies   Lumbosacral neuritis    Lumbosacral spondylosis    Lumbar spinal stenosis with neurogenic claudication--contributes to his chronic pain syndrome   Morbid obesity (HCC)    Normal memory function 08/2014   Neuropsychological testing ([facility]): no cognitive impairment or sign of neurodegenerative disorder.  Likely has adjustment d/o with mixed anxiety/depressed features and may benefit from low dose SNRI.     Normocytic anemia 03/2016   Mild-pt  needs ferritin and vit B12 level checked (as of 03/22/16). Hb stable 09/2019.  Hemoccult x 3 NEG 03/2022.  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 chronic?) Off anticoag for a while due to falls.  Then apixaban  started 12/2014.   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 Clorinda): neuropathic pain->diff to treat, failed nucynta , failed spinal stimul trial, oxycontin  hs + tramadol  + gabap as of 12/2017 f/u Dr. Carilyn.   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   Sick sinus syndrome (HCC)    PPM placed   Thrombocytopenia 2018   HSM on 2007 abd u/s---suspect some mild splenic sequestration chronically.   Unspecified glaucoma(365.9)    Unspecified hereditary and idiopathic peripheral neuropathy approx age 21   bilat LE's, ? left arm, too.  Feet became progressively numb + left foot pain intermittently.  Pt may be trying a spinal stimulator (as of 05/2015)   Vaccine counseling 11/16/2021   VENOUS INSUFFICIENCY    Being followed by Dr. Harden as of 10/2016 for two R LL venous stasis ulcers/skin tears.  Healed as of 10/30/16 f/u with Dr. Harden.   Venous stasis dermatitis 12/27/2021   VENTRAL HERNIA     Medications:  Scheduled:   acetaminophen   650 mg Oral TID   [START ON 06/16/2024] amoxicillin   1,000 mg Oral BID   arformoterol   15 mcg Nebulization Q12H   ARIPiprazole   1 mg Oral QHS   budesonide  (PULMICORT ) nebulizer solution  0.5 mg Nebulization BID   Chlorhexidine  Gluconate Cloth  6 each Topical Daily   collagenase   1 Application Topical Daily    enoxaparin  (LOVENOX ) injection  60 mg Subcutaneous Q24H   ezetimibe   10 mg Oral Daily   famotidine   20 mg Oral BID   feeding supplement  1 Container Oral TID BM   gabapentin   100 mg Oral TID   Gerhardt's butt cream   Topical BID   insulin  aspart  0-6 Units Subcutaneous TID AC & HS   lidocaine   1 patch Transdermal Q24H   lidocaine   1 Application Urethral Once   metoCLOPramide  (REGLAN ) injection  10 mg Intravenous Q8H   midodrine   5 mg Oral TID WC   mirtazapine   15 mg Oral QHS   multivitamin with minerals  1 tablet Oral Daily   ranolazine   500 mg Oral BID   revefenacin   175 mcg Nebulization Daily   rosuvastatin   20 mg Oral Daily  sodium chloride  flush  10-40 mL Intracatheter Q12H   thiamine   100 mg Oral Daily   torsemide   20 mg Oral Daily   vancomycin   125 mg Oral Daily   Infusions:   meropenem  (MERREM ) IV 1 g (06/15/24 9157)    Assessment: 66 yoM with PMH CAD s/p PCI, AS s/p TAVR with 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 Of note, PTA eliquis  has been on hold since October per wife due to history of fall and hematuria. Consulted to dose lovenox  for VTE prophylaxis.    Goal of Therapy:   Monitor platelets by anticoagulation protocol: Yes   Plan:  Lovenox  60mg  every 24 hours  Dionicia Canavan, PharmD, RPh PGY1 Acute Care Pharmacy Resident [name]  06/15/2024 2:23 PM       [1]  Allergies Allergen Reactions   Brimonidine Tartrate Shortness Of Breath    Alphagan-Shortness of breath   Brinzolamide Shortness Of Breath    AZOPT- Shortness of breath   Latanoprost Shortness Of Breath    XALATAN- Shortness of breath   Nucynta  [Tapentadol ] Shortness Of Breath   Sulfa Antibiotics Palpitations   Timolol Maleate Shortness Of Breath and Other (See Comments)    TIMOPTIC- Aggravated asthma    Diltiazem Swelling     leg swelling   Rofecoxib Swelling     VIOXX- leg swelling   Vancomycin  Rash    Tolerates oral vanc   Codeine Other (See Comments)    Childhood reaction   Tamsulosin Other (See Comments)    Dizziness    Celecoxib Other (See Comments)    CELLBREX-confusion   Colchicine Diarrhea    diarrhea   Tape Rash

## 2024-06-15 NOTE — Plan of Care (Signed)
°  Problem: Nutrition: Goal: Adequate nutrition will be maintained Outcome: Progressing   Problem: Activity: Goal: Risk for activity intolerance will decrease Outcome: Progressing   Problem: Pain Managment: Goal: General experience of comfort will improve and/or be controlled Outcome: Progressing   Problem: Elimination: Goal: Will not experience complications related to bowel motility Outcome: Progressing Goal: Will not experience complications related to urinary retention Outcome: Progressing   Problem: Safety: Goal: Ability to remain free from injury will improve Outcome: Progressing

## 2024-06-15 NOTE — Progress Notes (Signed)
 PROGRESS NOTE Tim [name]  FMW:[id number] DOB: [birth year] DOA: 06/05/2024 PCP: Tim Aleene DEL, MD  Brief Narrative/Hospital Course: Tim [name] is a 83 y.o. 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 Patient has history of enterococcal infection of the AICD lead that is being treated with chronic amoxicillin .   Recent admission at [facility] on 10/9-10/17:mixed hypovolemic/septic shock. At [facility] on 10/29-11/1:[name] treated w/ vanc. Wife states he has not had diarrhea symptoms and off po vanc. Did have diarrhea on 12/3 and so did wife. Wife also states he has poor po intake.  On 12/4 overnight patient had high fever. EMS called found temp of 103.7. In [name] 99.3 F and wbc 6.7. LA 5. Chronic foley in place w/ hematuria which was replaced. UA inconclusive. INR 3.7. CXR unremarkable. Cultures obtained and started on broad spectrum abx. Despite fluids remains hypotensive started on levoPHED  12/5 admit to [facility] 12/6 spiked fever with Tmax of 100.7, blood culture is growing ESBL gram-negative rods with E. coli/Klebsiella.  Vasopressor requirement improving, Levophed  at 13, vasopressin  at 0.03 unit.  Foley catheter was exchanged by urology, no more hematuria 12/8 norepinephrine  requirements coming down.  Platelet count stabilized.  LFTs climbing compared to last 72 hours 12/10: Transferred to [facility] service 12-11/12 having episodes of vomiting x-ray shows ileus versus gastroparesis  Subjective: Seen and examined  Wife at the bedside feeding him soup, She is more alert awake denies nausea vomiting chest pain and had a bowel movements Overnight afebrile BP stable to soft, doing well on room air, lab showed hypokalemia and hypomagnesemia stable hemoglobin  Assessment  and plan:  Septic shock-shock resolved Kleb pneumonia E coli bacteremia UTI-with history of BPH and chronic Foley, urine retention Leukemoid reaction with leukocytosis Hematuria due to traumatic Foley insertion resolved: Admitted with septic shock with multiple infection.  Shock resolved.  Now on low-dose midodrine  for support-SBP goal above 90 holding antihypertensive.  Blood culture from admission 12/4-5: E. coli and Klebsiella positive repeat blood culture 12/9 NGTD  Urine culture on admission E. coli positive-hematuria Foley changed by urology 12/11 Tim [name] Vitals stable afebrile , significantly leukocytosis 56k - resolved  On meropenem  completing and switching to home  Amoxicillin  12/15 -Continue control precaution/isolation Needs outpatient follow-up with Dr. Renda. Consider voiding trial as he is more stable and is able to ambulate. long-term would consider if he is a candidate for suprapubic catheter.   Aortic stenosis status post TAVR. Last TEE in October 2025 showed ejection fraction 65 to 70%  Anasarca:Volume overload with net balance Holding home meds for now, received Lasix  intermittently-continue on torsemide    Poor intake VomitingX2 on 12/12 Possible ileus versus gastroparesis on imaging: X-ray possible ileus versus gastroparesis on 12/12- added on Reglan  12/12 psychiatry stopped Cymbalta  due to concern for gastric irritation.Started Remeron  bedtime given his poor oral intake.  Repeat x-ray with improving gas pattern 12/13, having bowel movement-advance diet to soft diet today  Hypokalemia Hypomagnesemia: Continue to replace Recent Labs  Lab 06/11/24 0355 06/12/24 0500 06/13/24 0656 06/14/24 0249 06/15/24 0330  K 4.0 3.6 4.1 3.3* 3.4*  CALCIUM  7.7* 7.8* 7.7* 8.0* 7.6*  MG 1.7 1.7 1.5* 1.4* 1.4*   Paroxysmal A-fib with RVR: H/o CHB, has PPM History of hypertension-his Coreg amlodipine losartan  has been on hold since October:  Rate currently  controlled.Holding home Coreg and aspirin  in the setting of thrombocytopenia.  His Eliquis  has been on hold since October per wife by Dr Tim [name] due to history of fall and also hematuria. Needs PPM replacement Feb 2026.  Thrombocytopenia:  likely 2/2 sepsis and antibiotics, and stable >199k- will resume DVT prophylaxis    Anemia of chronic illness Monitor and transfuse for Hb <7   Hyperglycemia Stable, cont SSI PRN   Goals of care Deconditioning/debility Recurrent hospitalization Poor oral intake question rehab potential, continue PT OT aggressively.  Patient is DNR limited. PMT is consulted he has had very poor intake Created space and opportunity for patient to explore thoughts, feelings and fears regarding current medical situation Previously provided therapeutic listening.Wife is very overwhelmed with his medical condition and his overall decline. Will continue to address goals of care continue to treat reversible causes.  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  Mental status fairly stable but he is very forgetful.   Continue delirium precaution frequent reorientation PT OT and mobilize   Chronic back pain on chronic opiates: Patient reports no surgeon could fix his back, PTA on oxycodone  20 mg prn, gabapentin  800.  Continue pain meds and low-dose gabapentin  as tolerated  H/o C diff Continue suppressive vancomycin  while on meropenem .  No diarrhea but constipation-small BM 12/11   Elevated transaminases: Likely in the setting of shock, resolving.     Suicidal ideation: Psych has been consulted continue Cymbalta , Atarax . Safety sitter has been discontinued    Class I Obesity w/ Body mass index is  31.72 kg/m.: Will benefit with PCP follow-up, weight loss,healthy lifestyle and outpatient sleep eval if not done.  Left upper extremity single-lumen  midline 12/9 Foley in place   FEN: Continue dysphagia 3 diet, RD eval  Mobility: PT Orders: Active PT Follow up Rec: Skilled Nursing-Short Term Rehab (<3 Hours/Day)06/13/2024 1247   DVT prophylaxis: SCDs Start: 06/06/24 0316 lovenox  Code Status:   Code Status: Limited: Do not attempt resuscitation (DNR) -DNR-LIMITED -Do Not Intubate/DNI  Family Communication: plan of care discussed with patient/wife not at bedside.  I called and updated her Patient status is: Remains hospitalized because of severity of illness Level of care: Progressive   Dispo: The patient is from: HOME            Anticipated disposition: Hopefully SNF after completing IV meropenem    Objective: Vitals last 24 hrs: Vitals:   06/15/24 0400 06/15/24 0500 06/15/24 0800 06/15/24 1149  BP: (!) 96/57  118/65 (!) 112/55  Pulse: 79  88 95  Resp: 20  19 (!) 21  Temp: 98 F (36.7 C)  98.3 F (36.8 C) 97.8 F (36.6 C)  TempSrc: Oral  Axillary Oral  SpO2: 98%  98% 98%  Weight:  118.2 kg    Height:       Physical Examination: General exam: Alert awake fairly oriented, obese  HEENT:Oral mucosa moist, Ear/Nose WNL grossly Respiratory system: Bilaterally diminished BS Cardiovascular system: S1 & S2 +, No JVD. Gastrointestinal system: Abdomen soft, obese with mild distention  nt bs+ Nervous System: Alert, awake, moving all extremities,and following commands. Extremities: extremities warm, leg edema trace b/l with , left TMA old Skin: Warm, no rashes MSK: Normal muscle bulk,tone, power   Medications reviewed:  Scheduled  Meds:  acetaminophen   650 mg Oral TID   [START ON 06/16/2024] amoxicillin   1,000 mg Oral BID   arformoterol   15 mcg Nebulization Q12H   ARIPiprazole   1 mg Oral QHS   budesonide  (PULMICORT ) nebulizer solution  0.5 mg Nebulization BID    Chlorhexidine  Gluconate Cloth  6 each Topical Daily   collagenase   1 Application Topical Daily   enoxaparin  (LOVENOX ) injection  60 mg Subcutaneous Q24H   ezetimibe   10 mg Oral Daily   famotidine   20 mg Oral BID   feeding supplement  1 Container Oral TID BM   gabapentin   100 mg Oral TID   Gerhardt's butt cream   Topical BID   insulin  aspart  0-6 Units Subcutaneous TID AC & HS   lidocaine   1 patch Transdermal Q24H   lidocaine   1 Application Urethral Once   metoCLOPramide  (REGLAN ) injection  10 mg Intravenous Q8H   midodrine   5 mg Oral TID WC   mirtazapine   15 mg Oral QHS   multivitamin with minerals  1 tablet Oral Daily   ranolazine   500 mg Oral BID   revefenacin   175 mcg Nebulization Daily   rosuvastatin   20 mg Oral Daily   sodium chloride  flush  10-40 mL Intracatheter Q12H   thiamine   100 mg Oral Daily   torsemide   20 mg Oral Daily   vancomycin   125 mg Oral Daily   Continuous Infusions:  magnesium  sulfate bolus IVPB 4 g (06/15/24 0959)   meropenem  (MERREM ) IV 1 g (06/15/24 0842)   Diet: Diet Order             DIET SOFT Room service appropriate? Yes; Fluid consistency: Thin  Diet effective now                 Sick patient will resume Data Reviewed: I have personally reviewed following labs and imaging studies ( see epic result tab) CBC: Recent Labs  Lab 06/11/24 0355 06/12/24 0500 06/13/24 1130 06/14/24 0249 06/15/24 0330  WBC 11.6* 9.7 8.6 9.1 5.7  HGB 9.5* 10.1* 10.1* 9.8* 9.2*  HCT 28.4* 30.0* 30.3* 29.6* 27.8*  MCV 97.9 97.4 95.6 98.0 98.6  PLT 46* 68* 111* 123* 106*   CMP: Recent Labs  Lab 06/11/24 0355 06/12/24 0500 06/13/24 0656 06/14/24 0249 06/15/24 0330  NA 139 138 137 141 139  K 4.0 3.6 4.1 3.3* 3.4*  CL 105 102 103 106 103  CO2 30 30 23 28 29   GLUCOSE 90 114* 138* 106* 85  BUN 19 16 16 14 13   CREATININE 0.81 0.46* 0.66 0.55* 0.63  CALCIUM  7.7* 7.8* 7.7* 8.0* 7.6*  MG 1.7 1.7 1.5* 1.4* 1.4*   GFR: Estimated Creatinine Clearance: 96.6  mL/min (by C-G formula based on SCr of 0.63 mg/dL). Recent Labs  Lab 06/09/24 0645 06/10/24 0410 06/10/24 1150 06/11/24 0355  AST 523* 370*  --  202*  ALT 238* 256*  --  196*  ALKPHOS 198* 218*  --  212*  BILITOT 1.5* 1.1 1.1 1.1  PROT 4.8* 4.5*  --  4.4*  ALBUMIN 1.9* 1.8*  --  1.8*   No results for input(s): LIPASE, AMYLASE in the last 168 hours.  Recent Labs  Lab 06/11/24 0355  AMMONIA <13   Coagulation Profile:  Recent Labs  Lab 06/10/24 1150  INR 1.2   Unresulted Labs (From admission, onward)     Start     Ordered   06/16/24 0500  Basic metabolic panel with GFR  Tomorrow morning,   R       Question:  Specimen collection method  Answer:  Unit=Unit collect   06/15/24 0926   06/16/24 0500  CBC  Tomorrow morning,   R       Question:  Specimen collection method  Answer:  Unit=Unit collect   06/15/24 0926   06/07/24 0500  Magnesium   Daily,   R     Question:  Specimen collection method  Answer:  Lab=Lab collect   06/06/24 1126           Antimicrobials/Microbiology: Anti-infectives (From admission, onward)    Start     Dose/Rate Route Frequency Ordered Stop   06/16/24 1000  amoxicillin  (AMOXIL ) capsule 1,000 mg        1,000 mg Oral 2 times daily 06/09/24 1406     06/10/24 1000  vancomycin  (VANCOCIN ) capsule 125 mg        125 mg Oral Daily 06/09/24 1406 06/18/24 0959   06/09/24 1600  meropenem  (MERREM ) 1 g in sodium chloride  0.9 % 100 mL IVPB        1 g 200 mL/hr over 30 Minutes Intravenous Every 8 hours 06/09/24 1406 06/16/24 0759   06/07/24 1000  vancomycin  (VANCOCIN ) capsule 125 mg  Status:  Discontinued        125 mg Oral Daily 06/06/24 1432 06/09/24 1406   06/06/24 1000  linezolid  (ZYVOX ) IVPB 600 mg  Status:  Discontinued        600 mg 300 mL/hr over 60 Minutes Intravenous Every 12 hours 06/06/24 0704 06/06/24 1432   06/06/24 0800  ceFEPIme  (MAXIPIME ) 2 g in sodium chloride  0.9 % 100 mL IVPB  Status:  Discontinued        2 g 200 mL/hr over 30 Minutes  Intravenous Every 8 hours 06/06/24 0311 06/06/24 0704   06/06/24 0800  meropenem  (MERREM ) 1 g in sodium chloride  0.9 % 100 mL IVPB  Status:  Discontinued        1 g 200 mL/hr over 30 Minutes Intravenous Every 8 hours 06/06/24 0712 06/09/24 1406   06/06/24 0600  vancomycin  (VANCOCIN ) capsule 500 mg  Status:  Discontinued        500 mg Oral Every 6 hours 06/06/24 0310 06/06/24 1432   06/06/24 0400  metroNIDAZOLE  (FLAGYL ) IVPB 500 mg  Status:  Discontinued        500 mg 100 mL/hr over 60 Minutes Intravenous Every 8 hours 06/06/24 0310 06/06/24 0705   06/06/24 0200  vancomycin  (VANCOREADY) IVPB 2000 mg/400 mL  Status:  Discontinued        2,000 mg 100 mL/hr over 240 Minutes Intravenous  Once 06/06/24 0153 06/06/24 0705   06/06/24 0145  ampicillin  (OMNIPEN) 2 g in sodium chloride  0.9 % 100 mL IVPB        2 g 300 mL/hr over 20 Minutes Intravenous  Once 06/06/24 0139 06/06/24 0328   06/06/24 0000  ceFEPIme  (MAXIPIME ) 2 g in sodium chloride  0.9 % 100 mL IVPB        2 g 200 mL/hr over 30 Minutes Intravenous  Once 06/05/24 2358 06/06/24 0118         Component Value Date/Time   SDES BLOOD RIGHT HAND 06/10/2024 1813   SPECREQUEST  06/10/2024 1813    BOTTLES DRAWN AEROBIC AND ANAEROBIC Blood Culture results may not be optimal due to an inadequate volume of blood received in culture bottles   CULT  06/10/2024 1813    NO GROWTH 5 DAYS  Performed at [name], [address], [place], [place] [address]    REPTSTATUS 06/15/2024 FINAL 06/10/2024 1813    Procedures:    Mennie LAMY, MD [facility] 06/15/2024, 11:59 AM

## 2024-06-15 NOTE — TOC Progression Note (Signed)
 Transition of Care [name]) - Initial/Assessment Note    Patient Details  Name: Tim [name] MRN: [id number] Date of Birth: 06-21-1941  [facility]) CM/SW Contact:    Britt JULIANNA Bennetts, LCSW Phone Number: 06/15/2024, 9:06 AM  Clinical Narrative:                 CSW spoke with pt.'s spouse and attempted to present bed offers.  The spouse reports that she would prefer that bed offers be presented tomorrow.    ICM will continue to follow.  Expected Discharge Plan: Skilled Nursing Facility Barriers to Discharge: Continued Medical Work up   Patient Goals and CMS Choice            Expected Discharge Plan and Services In-house Referral: Clinical Social Work Discharge Planning Services: CM Consult Post Acute Care Choice: Home Health, Resumption of Svcs/PTA Provider Living arrangements for the past 2 months: Single Family Home                           HH Arranged: RN, PT HH [facility]: [facility] Date HH Agency Contacted: 06/10/24 Time HH Agency Contacted: 1455 Representative spoke with at [facility] Agency: [facility]  Prior Living Arrangements/Services Living arrangements for the past 2 months: Single Family Home Lives with:: Self, Spouse Patient language and need for interpreter reviewed:: No Do you feel safe going back to the place where you live?: Yes      Need for Family Participation in Patient Care: Yes (Comment) Care giver support system in place?: Yes (comment) Current home services: DME (cane, rolling walker, wheelchair and rollator.) Criminal Activity/Legal Involvement Pertinent to Current Situation/Hospitalization: No - Comment as needed  Activities of Daily Living   ADL Screening (condition at time of admission) Independently performs ADLs?: No Does the patient have a NEW difficulty with bathing/dressing/toileting/self-feeding that is expected to last >3 days?: Yes (Initiates electronic notice to provider for possible OT consult) Does  the patient have a NEW difficulty with getting in/out of bed, walking, or climbing stairs that is expected to last >3 days?: No Does the patient have a NEW difficulty with communication that is expected to last >3 days?: No Is the patient deaf or have difficulty hearing?: Yes Does the patient have difficulty seeing, even when wearing glasses/contacts?: No Does the patient have difficulty concentrating, remembering, or making decisions?: No  Permission Sought/Granted Permission sought to share information with : Family Supports Permission granted to share information with :  (Pt confused - spoke with spouse)     Permission granted to share info w AGENCY: SNFs  Permission granted to share info w Relationship: spouse  Permission granted to share info w Contact Information: [phone]  Emotional Assessment Appearance:: Appears stated age Attitude/Demeanor/Rapport: Unable to Assess Affect (typically observed): Unable to Assess   Alcohol  / Substance Use: Not Applicable Psych Involvement: Yes (comment)  Admission diagnosis:  Septic shock (HCC) [A41.9, R65.21] Patient Active Problem List   Diagnosis Date Noted   Malnutrition of moderate degree 06/12/2024   Suicidal ideation 06/10/2024   Encephalopathy 06/10/2024   Debility 06/10/2024   Septic shock (HCC) 06/06/2024   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   AKI (acute kidney injury) 04/12/2024   Confusion 04/12/2024   Uremia 04/12/2024   Shock (HCC) 04/10/2024   Osteomyelitis of left foot (HCC) 05/03/2023   Venous stasis dermatitis 12/27/2021   Cellulitis 12/14/2021   Wound of right lower extremity 12/14/2021  Transient hypotension 12/14/2021   Heart failure with preserved ejection fraction (HCC) 12/14/2021   Prediabetes 12/14/2021   Thrombocytopenia 12/14/2021   Obesity, Class III, BMI 40-49.9 (morbid obesity) (HCC) 12/14/2021   BPH (benign prostatic hyperplasia) 12/14/2021   Vaccine counseling 11/16/2021   Weakness  02/23/2021   Physical deconditioning 02/23/2021   Rhinitis 07/14/2020   Mitral valve vegetation 05/28/2020   Non-pressure chronic ulcer of other part of left foot limited to breakdown of skin (HCC)    Esophageal dysphagia    Bacteremia    Endocarditis of mitral valve    Fever 04/28/2020   Cervical lymphadenopathy 03/15/2020   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   ICD (implantable cardioverter-defibrillator) infection 12/22/2019   Gram-positive bacteremia 11/27/2019   FUO (fever of unknown origin) 11/26/2019   Hypotension 11/05/2019   Wound dehiscence 10/22/2019   PFO (patent foramen ovale) 10/06/2019   ICD (implantable cardioverter-defibrillator) battery depletion    Pressure injury of skin 09/13/2019   Enterococcal bacteremia history  09/13/2019   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   Urinary retention 09/13/2019   Phimosis 09/13/2019   SIRS (systemic inflammatory response syndrome) (HCC) 09/12/2019   Sepsis (HCC) 09/12/2019   Enterococcus UTI 09/12/2019   Cellulitis of foot    History of transmetatarsal amputation of left foot (HCC)    Subacute osteomyelitis, left ankle and foot (HCC)    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) 04/17/2018   Diabetic neuropathy, painful (HCC) 12/26/2016   Neuropathic pain of foot, left 06/20/2016   Insulin  resistance 07/22/2015   Status post amputation of left great toe 06/08/2015   Spinal stenosis of lumbar region 02/16/2015   Hereditary and idiopathic peripheral neuropathy 01/12/2015   Oral thrush 12/28/2014   Orthostatic dizziness 05/24/2014   Paresthesias with subjective weakness 01/30/2014   Bilateral thigh pain 01/30/2014   Abdominal wall pain in right upper quadrant 01/22/2014   Gross hematuria 09/25/2013   Intertrigo 09/25/2013   IBS (irritable bowel syndrome) 09/11/2013    Chronic pain syndrome 08/06/2013   CAD (coronary artery disease), native coronary artery 12/25/2012   PAF (paroxysmal atrial fibrillation) (HCC) 12/25/2012   Moderate aortic stenosis 12/25/2012   Aortic stenosis 12/24/2012   History of colonic polyps 10/30/2012   Diverticulosis of colon 10/30/2012   Internal hemorrhoids 10/30/2012   Change in bowel habits intermittent loose stools 10/30/2012   Routine health maintenance 06/09/2012   Hereditary peripheral neuropathy 10/10/2011   Long term current use of anticoagulant - apixiban 08/16/2010   Dyspnea on exertion 09/08/2009   HYPERCHOLESTEROLEMIA-PURE 01/13/2009   Essential hypertension, benign 01/13/2009   EDEMA 01/13/2009   GOUT 01/12/2009   GLAUCOMA 01/12/2009   Venous insufficiency (chronic) (peripheral) 01/12/2009   COPD (chronic obstructive pulmonary disease) (HCC) 01/12/2009   VENTRAL HERNIA 01/12/2009   OSTEOARTHRITIS 01/12/2009   ARTHRITIS 01/12/2009   DEGENERATIVE DISC DISEASE 01/12/2009   CATARACT, HX OF 01/12/2009   HEART MURMUR, HX OF 01/12/2009   BENIGN PROSTATIC HYPERTROPHY, HX OF 01/12/2009   Obesity 01/17/2007   OBSTRUCTIVE SLEEP APNEA 01/17/2007   ASTHMA 01/17/2007   GERD 01/17/2007   HALLUX VALGUS, ACQUIRED 01/17/2007   Pulmonary hypertension (HCC) 01/17/2007   PCP:  [name], MD Pharmacy:   [facility] [phone] - [place], [place] - [address] [place] [address] Phone: [phone] Fax: [phone]  [name] [address] [place]  [address] Phone: [phone] Fax: [phone]     Social Drivers of Health (SDOH) Social History: SDOH Screenings   Food Insecurity: No Food Insecurity (06/07/2024)  Housing: Low Risk (06/07/2024)  Recent Concern: Housing - High Risk (05/01/2024)   Received from [facility]  Transportation Needs: No Transportation Needs (06/07/2024)  Utilities: Not At Risk (06/07/2024)  Depression  (PHQ2-9): Low Risk (03/31/2024)  Financial Resource Strain: Low Risk (09/23/2023)  Physical Activity: Unknown (09/23/2023)  Social Connections: Moderately Isolated (06/07/2024)  Stress: No Stress Concern Present (05/01/2024)   Received from [facility]  Tobacco Use: Low Risk (06/05/2024)   SDOH Interventions:     Readmission Risk Interventions    04/15/2024    3:20 PM 04/14/2024    4:33 PM  Readmission Risk Prevention Plan  Transportation Screening Complete Complete  PCP or Specialist Appt within 5-7 Days Complete Complete  Home Care Screening Complete Complete  Medication Review (RN CM) Complete Referral to Pharmacy

## 2024-06-15 NOTE — Progress Notes (Signed)
 Palliative Medicine Inpatient Follow Up Note   HPI:   83 y.o. 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    Patient has history of enterococcal infection of the AICD lead that is being treated with chronic amoxicillin .  Patient recently admitted at [facility] from 10/9-10/17 with mixed hypovolemic/septic shock.  Also noted patient has been admitted to [facility] from 10/29-11/1 with [name] and was treated with vancomycin .  Wife states he has poor oral intake.  On 06/05/2024 overnight patient had high fever.  EMS called found temperature of 103.7.  Patient has chronic Foley in place with hematuria, Foley replaced.  Despite IV fluids remains hypotensive, therefore started on levo drip.  Transferred to ICU services for intensive care management.  Work note that patient has had 2 inpatient admissions in the last 6 months.  This is hospital stay day #9.  PMT has been consulted to assist with goals of care conversation.  Patient/family face treatment option decisions, advanced directive decisions, and anticipatory care needs.  Today's Discussion 06/15/2024  I have reviewed medical records including:  EPIC notes: Reviewed hospitalist progress notes from 06/14/2024, detailing plan of care.  Appears that WBC is improving, no longer in leukocytosis.  Noted for possible ileus versus gastroparesis on imaging, however no more nausea or vomiting and had a bowel movement, indicating improvement.  Also noted for electrolyte derangement (hypokalemia, hypomagnesemia), plan to continue with electrolyte replacement.  Reviewed to track clinical course and prognostication.  Vital signs:  Reviewed vital sign from 06/15/2024 at 10 AM: Blood pressure at 119/49, pulse rate 84 bpm, respiration 16 breaths/min, saturating well at 98% on room air. MAR: Reviewed PRN meds received over the last 24 hours.  Patient received 2 doses of 10 mg oxycodone  yesterday at 0002, and 0602.  Reviewed to assess needs for medication adjustment to optimize comfort.  Available advanced directives in ACP: Patient has HCPOA and living will document uploaded on file.   Met with patient/family to discuss diagnosis prognosis, GOC, EOL wishes, disposition and options.  I visited the patient at bedside today.  He was resting comfortably in bed in his Semi-Fowler's position, with wife present and assisting him with breakfast.  Although his appetite remains below baseline, there has been some improvement.  He is able to communicate his needs clearly and denies any pain or discomfort.  Oxygen saturations stable on room air, and he is not experiencing any acute distress.  He was able to participate in a meaningful conversation and reports no ongoing nausea or vomiting, stating that he feels better overall.  During my visit, the attending physician also evaluated the patient.  We reviewed the current medical plan and discussed the patient's progress, including the possibility of discharge within the next 2 to 3 days once he is medically stable.  I spoke with the patient's wife regarding discharge planning.  Both the patient and his wife understand that the medical team recommends discharge to a skilled nursing facility for short-term rehab to help him regain strength and endurance with the goal of safe return home.  However, they remain hesitant about this option due to previous negative experiences with such facilities.  We discussed that, given the severity of his illness, the road to  recovery may be difficult and unpredictable. Progress can be slow and there may be periods of improvement followed by setbacks or worsening  symptoms. This cyclical pattern is common in advanced disease, and its important to be prepared for ups and downs along the way.    Discussed with patient/family the importance of continued conversation with each other and the medical providers regarding overall plan of care and treatment options, ensuring decisions are within the context of the patients values and GOCs.    Discussed that the goal of rehab is improvement/stabalization of functional status,  which can be a difficult goal to meet for patients with advanced illness and multiple medical conditions. Reviewed what is needed for someone to have a positive rehabilitation experience to include adequate nutritional intake as well as willingness/ability to participate.   The goals of care remain unchanged.  Will continue with the current medical management, allow time for recovery, and monitor for further clinical improvement.  Both the patient and his wife acknowledge that recovery may be prolonged, with potential challenges or setbacks, and possible recurrent hospitalizations.  They remain hopeful that he will be medically optimized prior to discharge.  Created space and opportunity for patient to explore thoughts feelings and fears regarding current medical situation.  Patient and his wife face treatment option decisions, advanced directive decisions and anticipatory care needs.   Questions and concerns addressed   Palliative Support Provided.   Objective Assessment: Vital Signs Vitals:   06/15/24 0400 06/15/24 0800  BP: (!) 96/57 118/65  Pulse: 79 88  Resp: 20 19  Temp: 98 F (36.7 C) 98.3 F (36.8 C)  SpO2: 98% 98%    Intake/Output Summary (Last 24 hours) at 06/15/2024 1028 Last data filed at 06/15/2024 9348 Gross per 24 hour  Intake 1690 ml  Output 2175 ml  Net -485 ml   Last Weight  Most recent update: 06/15/2024  6:30 AM    Weight  118.2 kg (260 lb 9.3 oz)             Gen: Chronically ill-appearing HEENT:  Mucous membranes are moist CV: Regular rhythm, pulse rate 79 bpm PULM: Clear, however diminished bilaterally ABD: Soft and nontender EXT: Edema noted to all 4 extremities Neuro: Alert and oriented x 3  SUMMARY OF RECOMMENDATIONS    # Complex medical decision-making/goals of care CODE STATUS: Maintain DNR/DNI status Continue with current medical interventions, treat the treatable, allow time for outcomes and hopes for clinical improvement. Continue to observe delirium precautionary measures. Emphasized the importance of ongoing goals of care conversation between patient/family and the medical team to ensure medical interventions are aligned with the patient's goals and preferences. Recommends discharge to skilled nursing facility for short-term rehab, with ultimate goal to come home with wife. Palliative medicine team will continue to follow.   # Symptom management Per primary team Palliative medicine team is available to assist as needed  # Psychosocial support Provided psychosocial support to patient and patient's wife  # Discharge planning Plan for discharge to skilled nursing facility for short-term rehab   Discussed with: Patient, patient's wife, nursing staff, and the medical team.  I personally spent a total of 50 minutes in the care of the patient today including preparing to see the patient, getting/reviewing separately obtained history, performing a medically appropriate exam/evaluation, counseling and educating, referring and communicating with other health care professionals, documenting clinical information in the EHR, independently interpreting results, communicating results, and coordinating care.  ______________________________________________________________________________________ Kathlyne Bolder NP-C [place]  Palliative Medicine Team Team Cell Phone: [phone] Please utilize secure chat with additional questions, if there is no response within 30 minutes  please call the above phone number  Palliative Medicine Team providers are available by phone from 7am to 7pm daily and can be reached through the team cell phone.  Should this patient require assistance outside of these hours, please call the patient's attending physician.

## 2024-06-16 DIAGNOSIS — Z515 Encounter for palliative care: Secondary | ICD-10-CM | POA: Diagnosis not present

## 2024-06-16 DIAGNOSIS — J150 Pneumonia due to Klebsiella pneumoniae: Secondary | ICD-10-CM | POA: Diagnosis not present

## 2024-06-16 DIAGNOSIS — T83511A Infection and inflammatory reaction due to indwelling urethral catheter, initial encounter: Secondary | ICD-10-CM | POA: Diagnosis not present

## 2024-06-16 DIAGNOSIS — E8721 Acute metabolic acidosis: Secondary | ICD-10-CM | POA: Diagnosis not present

## 2024-06-16 DIAGNOSIS — Z66 Do not resuscitate: Secondary | ICD-10-CM | POA: Diagnosis not present

## 2024-06-16 DIAGNOSIS — L89153 Pressure ulcer of sacral region, stage 3: Secondary | ICD-10-CM | POA: Diagnosis not present

## 2024-06-16 DIAGNOSIS — K7200 Acute and subacute hepatic failure without coma: Secondary | ICD-10-CM | POA: Diagnosis not present

## 2024-06-16 DIAGNOSIS — A4159 Other Gram-negative sepsis: Secondary | ICD-10-CM | POA: Diagnosis not present

## 2024-06-16 DIAGNOSIS — N170 Acute kidney failure with tubular necrosis: Secondary | ICD-10-CM | POA: Diagnosis not present

## 2024-06-16 DIAGNOSIS — D65 Disseminated intravascular coagulation [defibrination syndrome]: Secondary | ICD-10-CM | POA: Diagnosis not present

## 2024-06-16 DIAGNOSIS — G9341 Metabolic encephalopathy: Secondary | ICD-10-CM | POA: Diagnosis not present

## 2024-06-16 LAB — GLUCOSE, CAPILLARY
Glucose-Capillary: 78 mg/dL (ref 70–99)
Glucose-Capillary: 102 mg/dL — ABNORMAL HIGH (ref 70–99)
Glucose-Capillary: 148 mg/dL — ABNORMAL HIGH (ref 70–99)

## 2024-06-16 LAB — BASIC METABOLIC PANEL WITH GFR
Anion gap: 10 (ref 5–15)
BUN: 15 mg/dL (ref 8–23)
CO2: 24 mmol/L (ref 22–32)
Calcium: 7.8 mg/dL — ABNORMAL LOW (ref 8.9–10.3)
Chloride: 103 mmol/L (ref 98–111)
Creatinine, Ser: 0.68 mg/dL (ref 0.61–1.24)
GFR, Estimated: >60 mL/min (ref >60)
Glucose, Bld: 75 mg/dL (ref 70–99)
Potassium: 4 mmol/L (ref 3.5–5.1)
Sodium: 137 mmol/L (ref 135–145)

## 2024-06-16 LAB — CBC
HCT: 32.3 % — ABNORMAL LOW (ref 39.0–52.0)
Hemoglobin: 10.2 g/dL — ABNORMAL LOW (ref 13.0–17.0)
MCH: 31.6 pg (ref 26.0–34.0)
MCHC: 31.6 g/dL (ref 30.0–36.0)
MCV: 100 fL (ref 80.0–100.0)
Platelets: 126 K/uL — ABNORMAL LOW (ref 150–400)
RBC: 3.23 MIL/uL — ABNORMAL LOW (ref 4.22–5.81)
RDW: 15.3 % (ref 11.5–15.5)
WBC: 6.2 K/uL (ref 4.0–10.5)
nRBC: 0 % (ref 0.0–0.2)

## 2024-06-16 LAB — MAGNESIUM: Magnesium: 1.8 mg/dL (ref 1.7–2.4)

## 2024-06-16 MED ORDER — METOCLOPRAMIDE HCL 5 MG/ML IJ SOLN
5.0000 mg | Freq: Three times a day (TID) | INTRAMUSCULAR | Status: AC
  Administered 2024-06-16 – 2024-06-17 (×4): 5 mg via INTRAVENOUS
  Filled 2024-06-16 (×4): qty 2

## 2024-06-16 MED ORDER — MAGNESIUM SULFATE IN D5W 1-5 GM/100ML-% IV SOLN
1.0000 g | Freq: Once | INTRAVENOUS | Status: AC
  Administered 2024-06-16: 18:00:00 1 g via INTRAVENOUS
  Filled 2024-06-16: qty 100

## 2024-06-16 MED ORDER — MIRTAZAPINE 15 MG PO TABS
15.0000 mg | ORAL_TABLET | Freq: Every day | ORAL | Status: DC
  Administered 2024-06-16 – 2024-06-24 (×9): 15 mg via ORAL
  Filled 2024-06-16 (×9): qty 1

## 2024-06-16 MED ADMIN — THERA M PLUS PO TABS: 1 | ORAL | @ 10:00:00 | NDC 1650058694

## 2024-06-16 MED ADMIN — Metoclopramide HCl Inj 5 MG/ML (Base Equivalent): 10 mg | INTRAVENOUS | @ 06:00:00 | NDC 00409341421

## 2024-06-16 NOTE — Progress Notes (Addendum)
 PROGRESS NOTE Tim [name]  FMW:[id number] DOB: Nov 13, 1940 DOA: 06/05/2024 PCP: Tim Aleene DEL, MD  Brief Narrative/Hospital Course: Tim [name] is a 83 y.o. 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 Patient has history of enterococcal infection of the AICD lead that is being treated with chronic amoxicillin .   Recent admission at [facility] on 10/9-10/17:mixed hypovolemic/septic shock. At [facility] on 10/29-11/1:[name] treated w/ vanc. Wife states he has not had diarrhea symptoms and off po vanc. Did have diarrhea on 12/3 and so did wife. Wife also states he has poor po intake.  On 12/4 overnight patient had high fever. EMS called found temp of 103.7. In [name] 99.3 F and wbc 6.7. LA 5. Chronic foley in place w/ hematuria which was replaced. UA inconclusive. INR 3.7. CXR unremarkable. Cultures obtained and started on broad spectrum abx. Despite fluids remains hypotensive started on levoPHED  12/5 admit to [facility] 12/6 spiked fever with Tmax of 100.7, blood culture is growing ESBL gram-negative rods with E. coli/Klebsiella.  Vasopressor requirement improving, Levophed  at 13, vasopressin  at 0.03 unit.  Foley catheter was exchanged by urology, no more hematuria 12/8 norepinephrine  requirements coming down.  Platelet count stabilized.  LFTs climbing compared to last 72 hours 12/10: Transferred to [facility] service 12-11/12 having episodes of vomiting x-ray shows ileus versus gastroparesis  Subjective: Seen and examined  Patient feels much better, having breakfast, wife at the bedside Overnight remains afebrile BP stable to soft in 90s-106, on room air Labs reviewed stable BMP and CBC  Assessment and plan:  Septic shock-shock resolved Kleb pneumonia E coli  bacteremia UTI-with history of BPH and chronic Foley, urine retention Leukemoid reaction with leukocytosis Hematuria due to traumatic Foley insertion- resolved: shock physiology resolved needing low-dose midodrine  as BP remains soft  Blood culture from admission 12/4-5: E. coli and Klebsiella positive repeat blood culture 12/9 NGTD  Urine culture- E. coli positive-hematuria Foley changed by urology 12/11 [name] Had significantly leukocytosis 56k - resolved.  Completed meropenem  and switch to home amoxicillin  12/15 and on oral vancomycin  for [name] prophylaxis Needs outpatient follow-up with Dr. Renda regarding his urological issues- will reach out to him ot update. Consider voiding trial as he is more stable and is able to ambulate. long-term would consider if he is a candidate for suprapubic catheter.   Aortic stenosis status post TAVR. Last TEE in October 2025 showed ejection fraction 65 to 70%  Anasarca:Volume overload with net balance Continue home torsemide .    Poor intake VomitingX2 on 12/12 Possible ileus versus gastroparesis on imaging: X-ray possible ileus versus gastroparesis on 12/12- added on Reglan  12/12 psychiatry stopped Cymbalta  due to concern for gastric irritation.Started Remeron  bedtime given his poor oral intake.  Repeat x-ray with improving gas pattern 12/13, having bowel movement-advance diet, will start to wean down Reglan .  Hypokalemia Hypomagnesemia: Resolved.    Paroxysmal A-fib with RVR: H/o CHB, has PPM History of hypertension-his Coreg amlodipine losartan  has been on hold since October: Rate currently controlled.Holding home Coreg and aspirin  in the setting of thrombocytopenia.  His Eliquis  has been on hold since October ( he had gross hematuira and eliquis  was held that time)- This admit home Rn exchanged Foley and he had gross hematuria after that and admitted to [facility] with septic shock- Discussed w/ Dr  Borden from [facility]- if hematuria from  traumatic foley and since urine is clear now- can do eliquis  if he needs it- wife requesting cardiology assistance and I have reached out to cardio   by Dr Renda due to history of fall and also hematuria.  Started on DVT prophylaxis here Needs PPM replacement Feb 2026.  Thrombocytopenia: likely 2/2 sepsis and antibiotics, and stable >199k-    Anemia of chronic illness Monitor and transfuse for Hb <7   Hyperglycemia Stable, cont SSI PRN   Goals of care Deconditioning/debility Recurrent hospitalization Poor oral intake question rehab potential, continue PT OT aggressively.  Patient is DNR limited. PMT is consulted he has had very poor intake Created space and opportunity for patient to explore thoughts, feelings and fears regarding current medical situation Previously provided therapeutic listening.Wife is very overwhelmed with his medical condition and his overall decline-and also anxious about his rehab. Will continue to address goals of care continue to treat reversible causes.  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  Mental status fairly stable but he is very forgetful.   Continue delirium precaution frequent reorientation PT OT and mobilize   Chronic back pain on chronic opiates: Patient reports no surgeon could fix his back, PTA on oxycodone  20 mg prn, gabapentin  800.  Continue pain meds and low-dose gabapentin  as tolerated  H/o C diff Continue suppressive vancomycin  while on meropenem .No diarrhea but constipation at times   Elevated transaminases: Likely in the setting of shock, resolving.     Adjustment disorder versus MDD/anxiety/paranoid thoughts Suicidal ideation: Psych has been-safety sitter  has been discontinued.  Now on Remeron  or Cymbalta  due to vomiting, also stopping Abilify  can resume if has paranoid thoughts again   ?  Trigeminy in monitor: Has paced rhythm, asymptomatic monitor electrolytes continue telemetry.  Mag 1.8 will replace with IV mag, potassium at 4  Class I Obesity w/ Body mass index is 31.72 kg/m.: Will benefit with PCP follow-up, weight loss,healthy lifestyle and outpatient sleep eval if not done.  Left upper extremity single-lumen  midline 12/9 Foley in place   FEN: Continue dysphagia 3 diet, RD eval  Mobility: PT Orders: Active PT Follow up Rec: Skilled Nursing-Short Term Rehab (<3 Hours/Day)06/13/2024 1247   DVT prophylaxis: SCDs Start: 06/06/24 0316 lovenox  Code Status:   Code Status: Limited: Do not attempt resuscitation (DNR) -DNR-LIMITED -Do Not Intubate/DNI  Family Communication: plan of care discussed with patient/wife at bedside updated Patient status is: Remains hospitalized because of severity of illness Level of care: Progressive   Dispo: The patient is from: HOME            Anticipated disposition: Hopefully SNF  soon  Objective: Vitals last 24 hrs: Vitals:   06/15/24 1938 06/15/24 2307 06/16/24 0330 06/16/24 0800  BP: 106/86 (!) 106/55 (!) 100/55 (!) 108/51  Pulse: 86 90 84 84  Resp: 20 17 17 18   Temp:      TempSrc:    Oral  SpO2: 97% 98% 97% 96%  Weight:      Height:       Physical Examination: General exam: AOO, weak frail obese HEENT:Oral mucosa moist, Ear/Nose WNL grossly Respiratory system: Bilaterally diminished BS Cardiovascular system: S1 & S2 +, No JVD. Gastrointestinal system: Abdomen soft, obese with mild distention  nt  bs+ Nervous System: Alert, awake, moving all extremities,and following commands. Extremities: extremities warm, leg edema trace b/l with , left TMA old healed. Skin: Warm, no rashes MSK: Normal muscle bulk,tone, power   Medications reviewed:  Scheduled Meds:  acetaminophen   650 mg Oral  TID   amoxicillin   1,000 mg Oral BID   arformoterol   15 mcg Nebulization Q12H   budesonide  (PULMICORT ) nebulizer solution  0.5 mg Nebulization BID   Chlorhexidine  Gluconate Cloth  6 each Topical Daily   collagenase   1 Application Topical Daily   enoxaparin  (LOVENOX ) injection  60 mg Subcutaneous Q24H   ezetimibe   10 mg Oral Daily   famotidine   20 mg Oral BID   feeding supplement  1 Container Oral TID BM   gabapentin   100 mg Oral TID   Gerhardt's butt cream   Topical BID   insulin  aspart  0-6 Units Subcutaneous TID AC & HS   lidocaine   1 patch Transdermal Q24H   lidocaine   1 Application Urethral Once   metoCLOPramide  (REGLAN ) injection  5 mg Intravenous Q8H   midodrine   5 mg Oral TID WC   mirtazapine   15 mg Oral QHS   multivitamin with minerals  1 tablet Oral Daily   ranolazine   500 mg Oral BID   revefenacin   175 mcg Nebulization Daily   rosuvastatin   20 mg Oral Daily   sodium chloride  flush  10-40 mL Intracatheter Q12H   thiamine   100 mg Oral Daily   torsemide   20 mg Oral Daily   vancomycin   125 mg Oral Daily   Continuous Infusions:  magnesium  sulfate bolus IVPB      Diet: Diet Order             DIET SOFT Room service appropriate? Yes; Fluid consistency: Thin  Diet effective now                 Sick patient will resume Data Reviewed: I have personally reviewed following labs and imaging studies ( see epic result tab) CBC: Recent Labs  Lab 06/12/24 0500 06/13/24 1130 06/14/24 0249 06/15/24 0330 06/16/24 0626  WBC 9.7 8.6 9.1 5.7 6.2  HGB 10.1* 10.1* 9.8* 9.2* 10.2*  HCT 30.0* 30.3* 29.6* 27.8* 32.3*  MCV 97.4 95.6 98.0 98.6 100.0  PLT 68* 111* 123* 106* 126*   CMP: Recent Labs  Lab 06/12/24 0500 06/13/24 0656 06/14/24 0249 06/15/24 0330 06/16/24 0626  NA 138 137 141 139 137  K 3.6 4.1 3.3* 3.4* 4.0  CL 102 103 106 103 103  CO2 30 23 28 29 24   GLUCOSE 114* 138* 106* 85 75  BUN 16 16 14 13 15   CREATININE 0.46* 0.66 0.55* 0.63 0.68  CALCIUM  7.8*  7.7* 8.0* 7.6* 7.8*  MG 1.7 1.5* 1.4* 1.4* 1.8   GFR: Estimated Creatinine Clearance: 96.6 mL/min (by C-G formula based on SCr of 0.68 mg/dL). Recent Labs  Lab 06/10/24 0410 06/10/24 1150 06/11/24 0355  AST 370*  --  202*  ALT 256*  --  196*  ALKPHOS 218*  --  212*  BILITOT 1.1 1.1 1.1  PROT 4.5*  --  4.4*  ALBUMIN 1.8*  --  1.8*   No results for input(s): LIPASE, AMYLASE in the last 168 hours.  Recent Labs  Lab 06/11/24 0355  AMMONIA <13   Coagulation Profile:  Recent Labs  Lab 06/10/24 1150  INR 1.2   Unresulted Labs (From admission, onward)     Start     Ordered  06/17/24 0500  Basic metabolic panel with GFR  Tomorrow morning,   R       Question:  Specimen collection method  Answer:  Lab=Lab collect   06/16/24 1200   06/17/24 0500  CBC  Tomorrow morning,   R       Question:  Specimen collection method  Answer:  Lab=Lab collect   06/16/24 1200   06/07/24 0500  Magnesium   Daily,   R     Question:  Specimen collection method  Answer:  Lab=Lab collect   06/06/24 1126           Antimicrobials/Microbiology: Anti-infectives (From admission, onward)    Start     Dose/Rate Route Frequency Ordered Stop   06/16/24 1000  amoxicillin  (AMOXIL ) capsule 1,000 mg        1,000 mg Oral 2 times daily 06/09/24 1406     06/10/24 1000  vancomycin  (VANCOCIN ) capsule 125 mg        125 mg Oral Daily 06/09/24 1406 06/18/24 0959   06/09/24 1600  meropenem  (MERREM ) 1 g in sodium chloride  0.9 % 100 mL IVPB        1 g 200 mL/hr over 30 Minutes Intravenous Every 8 hours 06/09/24 1406 06/16/24 0759   06/07/24 1000  vancomycin  (VANCOCIN ) capsule 125 mg  Status:  Discontinued        125 mg Oral Daily 06/06/24 1432 06/09/24 1406   06/06/24 1000  linezolid  (ZYVOX ) IVPB 600 mg  Status:  Discontinued        600 mg 300 mL/hr over 60 Minutes Intravenous Every 12 hours 06/06/24 0704 06/06/24 1432   06/06/24 0800  ceFEPIme  (MAXIPIME ) 2 g in sodium chloride  0.9 % 100 mL IVPB  Status:   Discontinued        2 g 200 mL/hr over 30 Minutes Intravenous Every 8 hours 06/06/24 0311 06/06/24 0704   06/06/24 0800  meropenem  (MERREM ) 1 g in sodium chloride  0.9 % 100 mL IVPB  Status:  Discontinued        1 g 200 mL/hr over 30 Minutes Intravenous Every 8 hours 06/06/24 0712 06/09/24 1406   06/06/24 0600  vancomycin  (VANCOCIN ) capsule 500 mg  Status:  Discontinued        500 mg Oral Every 6 hours 06/06/24 0310 06/06/24 1432   06/06/24 0400  metroNIDAZOLE  (FLAGYL ) IVPB 500 mg  Status:  Discontinued        500 mg 100 mL/hr over 60 Minutes Intravenous Every 8 hours 06/06/24 0310 06/06/24 0705   06/06/24 0200  vancomycin  (VANCOREADY) IVPB 2000 mg/400 mL  Status:  Discontinued        2,000 mg 100 mL/hr over 240 Minutes Intravenous  Once 06/06/24 0153 06/06/24 0705   06/06/24 0145  ampicillin  (OMNIPEN) 2 g in sodium chloride  0.9 % 100 mL IVPB        2 g 300 mL/hr over 20 Minutes Intravenous  Once 06/06/24 0139 06/06/24 0328   06/06/24 0000  ceFEPIme  (MAXIPIME ) 2 g in sodium chloride  0.9 % 100 mL IVPB        2 g 200 mL/hr over 30 Minutes Intravenous  Once 06/05/24 2358 06/06/24 0118         Component Value Date/Time   SDES BLOOD RIGHT HAND 06/10/2024 1813   SPECREQUEST  06/10/2024 1813    BOTTLES DRAWN AEROBIC AND ANAEROBIC Blood Culture results may not be optimal due to an inadequate volume of blood received in culture bottles   CULT  06/10/2024 1813    NO GROWTH 5 DAYS Performed at [facility], [address], [place], [place] [address]    REPTSTATUS 06/15/2024 FINAL 06/10/2024 1813    Procedures: Mennie LAMY, MD [facility] 06/16/2024, 4:39 PM

## 2024-06-16 NOTE — Progress Notes (Signed)
 Calorie Count Note  48 hour calorie count ordered.  Diet: advanced to Soft 12/14 Supplements:  Magic cup TID with meals, each supplement provides 290 kcal and 9 grams of protein Boost Breeze po TID, each supplement provides 250 kcal and 9 grams of protein  12/14: no meal tickets saved 12/15: Breakfast: 866 kcal, 19 gm protein            Supplements: 540 kcal, 18 gm protein  Patient did not order lunch today and has yet to order dinner today.   NUTRITION DIAGNOSIS:  Moderate Malnutrition related to chronic illness (CAD, AS, endocarditis, HF, COPD) as evidenced by moderate fat depletion, moderate muscle depletion, edema; ongoing.   GOAL:  Patient will meet greater than or equal to 90% of their needs; progressing.  INTERVENTION:  Continue calorie count x 24 hours more Continue MVI with minerals daily Continue Magic cup TID with meals, each supplement provides 290 kcal and 9 grams of protein Change supplement to Ensure Plus High Protein po BID, each supplement provides 350 kcal and 20 grams of protein   Suzen HUNT RD, LDN, CNSC Contact via secure chat. If unavailable, use group chat RD Inpatient.

## 2024-06-16 NOTE — Progress Notes (Signed)
 Physical Therapy Treatment Patient Details Name: Tim [name] MRN: [id number] DOB: Oct 30, 1940 Today's Date: 06/16/2024   History of Present Illness The pt is an 83 yo male admitted 12/4 with fever 103.7, found to have ESBL klebsiella ecoli bacteremia and UTI. Hospital stay complicated by hypotension requiring pressors and ICU, and acute metabolic encephalopathy. PMH includes: Cdiff 10/27-11/5 at [facility], HTN TAVR, hx of endocarditis, mitral valve lesion, AKI, CAD, PAF, CAD s/p PCI, complete heart block s/p PPM, HTN, a fib, DM, OSA, depression, AICD, glaucoma    PT Comments  Pt resting in bed on arrival, lethargic but participatory in session with pt keeping eyes closed most of session. With increased time pt able to follow simple single steps commands and giving fair effort for bed mobility and bed level exercises. Pt able to perform LE supine exercises for increased ROM and strength and pulling up into long sitting in bed x3 for increased UE strength and for carryover for functional transfers. EOB and OOB mobility deferred this session due to pt level of arousal and for pt safety. Pt continues to benefit from skilled PT services to progress toward functional mobility goals.     If plan is discharge home, recommend the following: Two people to help with walking and/or transfers;Two people to help with bathing/dressing/bathroom;Assistance with cooking/housework;Assistance with feeding;Direct supervision/assist for medications management;Direct supervision/assist for financial management;Assist for transportation;Help with stairs or ramp for entrance;Supervision due to cognitive status   Can travel by private vehicle     No  Equipment Recommendations  Wheelchair (measurements PT);Wheelchair cushion (measurements PT);Hospital bed;Hoyer lift    Recommendations for Other Services       Precautions / Restrictions Precautions Precautions: Fall Recall of Precautions/Restrictions:  Impaired Restrictions Weight Bearing Restrictions Per Provider Order: No     Mobility  Bed Mobility Overal bed mobility: Needs Assistance Bed Mobility: Rolling, Supine to Sit Rolling: Total assist   Supine to sit: Max assist     General bed mobility comments: total A to roll, max A to pull up to long sitting with bil rail use    Transfers Overall transfer level: Needs assistance                 General transfer comment: Deferred 2/2 level of alertness    Ambulation/Gait               General Gait Details: unable at this time   Stairs             Wheelchair Mobility     Tilt Bed    Modified Rankin (Stroke Patients Only)       Balance Overall balance assessment: Needs assistance Sitting-balance support: Bilateral upper extremity supported Sitting balance-Leahy Scale: Poor                                      Communication Communication Communication: Impaired Factors Affecting Communication: Hearing impaired;Reduced clarity of speech (limited verbalizations)  Cognition Arousal: Lethargic Behavior During Therapy: Flat affect, Anxious   PT - Cognitive impairments: Difficult to assess                       PT - Cognition Comments: Primarily kept eyes closed with intermittent following of commands. Following commands: Impaired      Cueing Cueing Techniques: Verbal cues, Tactile cues  Exercises General Exercises - Lower Extremity Ankle Circles/Pumps: AAROM, Both, 10 reps  Heel Slides: AAROM, Right, Left, 10 reps Hip ABduction/ADduction: AAROM, Both, 10 reps Other Exercises Other Exercises: long axis hip internal/external rotation x10 each side    General Comments        Pertinent Vitals/Pain      Home Living                          Prior Function            PT Goals (current goals can now be found in the care plan section) Acute Rehab PT Goals Patient Stated Goal: to get better PT Goal  Formulation: With patient Time For Goal Achievement: 06/25/24 Progress towards PT goals: Progressing toward goals    Frequency    Min 2X/week      PT Plan      Co-evaluation              AM-PAC PT 6 Clicks Mobility   Outcome Measure  Help needed turning from your back to your side while in a flat bed without using bedrails?: Total Help needed moving from lying on your back to sitting on the side of a flat bed without using bedrails?: Total Help needed moving to and from a bed to a chair (including a wheelchair)?: Total Help needed standing up from a chair using your arms (e.g., wheelchair or bedside chair)?: Total Help needed to walk in hospital room?: Total Help needed climbing 3-5 steps with a railing? : Total 6 Click Score: 6    End of Session   Activity Tolerance: No increased pain;Patient limited by fatigue;Patient limited by lethargy Patient left: in bed;with call bell/phone within reach;with bed alarm set;with family/visitor present Nurse Communication: Mobility status PT Visit Diagnosis: Muscle weakness (generalized) (M62.81);Difficulty in walking, not elsewhere classified (R26.2)     Time: 8642-8580 PT Time Calculation (min) (ACUTE ONLY): 22 min  Charges:    $Therapeutic Exercise: 8-22 mins PT General Charges $$ ACUTE PT VISIT: 1 Visit                     Aldean Pipe R. PTA Acute Rehabilitation Services Office: [phone]   Therisa CHRISTELLA Boor 06/16/2024, 2:22 PM

## 2024-06-16 NOTE — Consult Note (Addendum)
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  (LOVENOX ) injection 60 mg, 60 mg, Subcutaneous, Q24H, Labi, Vanna L, RPH, 60 mg at 06/16/24 1111   ezetimibe  (ZETIA ) tablet 10 mg, 10 mg, Oral, Daily, Kc, Ramesh, MD, 10 mg at 06/16/24 0932   famotidine  (PEPCID ) tablet 20 mg, 20 mg, Oral, BID, Babcock, Peter E, NP, 20 mg at 06/16/24 0932   feeding supplement (BOOST / RESOURCE BREEZE) liquid 1 Container, 1 Container, Oral, TID BM, Kc, Ramesh, MD, 1 Container at 06/16/24 9062   gabapentin  (NEURONTIN ) capsule 100 mg, 100 mg, Oral, TID, Babcock, Peter E, NP, 100 mg at 06/16/24 0932   Gerhardt's butt cream, , Topical, BID, Jenna Maude BRAVO, NP, Given at 06/16/24 9062   insulin  aspart (novoLOG ) injection 0-6 Units, 0-6 Units, Subcutaneous, TID AC & HS, Babcock, Peter E, NP, 1 Units at 06/14/24 1620   ipratropium-albuterol  (DUONEB) 0.5-2.5 (3) MG/3ML nebulizer solution 3 mL, 3 mL, Nebulization, Q4H PRN, Jenna Maude BRAVO, NP   lidocaine  (LIDODERM ) 5 %  1 patch, 1 patch, Transdermal, Q24H, Babcock, Peter E, NP, 1 patch at 06/16/24 1114   lidocaine  (XYLOCAINE ) 2 % jelly 1 Application, 1 Application, Urethral, Once, Babcock, Peter E, NP   magnesium  sulfate IVPB 1 g 100 mL, 1 g, Intravenous, Once, Kc, Ramesh, MD   metoCLOPramide  (REGLAN ) injection 5 mg, 5 mg, Intravenous, Q8H, Kc, Ramesh,  MD   midodrine  (PROAMATINE ) tablet 5 mg, 5 mg, Oral, TID WC, Babcock, Peter E, NP, 5 mg at 06/16/24 1110   mirtazapine  (REMERON ) tablet 15 mg, 15 mg, Oral, QHS, Lenard Calin, MD   multivitamin with minerals tablet 1 tablet, 1 tablet, Oral, Daily, Kc, Ramesh, MD, 1 tablet at 06/16/24 0932   ondansetron  (ZOFRAN ) injection 4 mg, 4 mg, Intravenous, Q6H PRN, Kc, Ramesh, MD, 4 mg at 06/13/24 1737   Oral care mouth rinse, 15 mL, Mouth Rinse, PRN, Babcock, Peter E, NP   oxyCODONE  (Oxy IR/ROXICODONE ) immediate release tablet 10 mg, 10 mg, Oral, Q6H PRN, Macrohon, Erwin F Jr., NP, 10 mg at 06/16/24 0051   oxyCODONE  (Oxy IR/ROXICODONE ) immediate release tablet 5 mg, 5 mg, Oral, Q6H PRN, 5 mg at 06/11/24 1244 **OR** [DISCONTINUED] oxyCODONE  (Oxy IR/ROXICODONE ) immediate release tablet 10 mg, 10 mg, Oral, Q6H PRN, Paliwal, Aditya, MD, 10 mg at 06/13/24 0929   polyethylene glycol (MIRALAX  / GLYCOLAX ) packet 17 g, 17 g, Oral, Daily PRN, Babcock, Peter E, NP   ranolazine  (RANEXA ) 12 hr tablet 500 mg, 500 mg, Oral, BID, Kc, Ramesh, MD, 500 mg at 06/16/24 0932   revefenacin  (YUPELRI ) nebulizer solution 175 mcg, 175 mcg, Nebulization, Daily, Babcock, Peter E, NP, 175 mcg at 06/16/24 0820   rosuvastatin  (CRESTOR ) tablet 20 mg, 20 mg, Oral, Daily, Kc, Ramesh, MD, 20 mg at 06/16/24 0932   sodium chloride  flush (NS) 0.9 % injection 10-40 mL, 10-40 mL, Intracatheter, Q12H, Jenna Maude BRAVO, NP, 20 mL at 06/16/24 1113   sodium chloride  flush (NS) 0.9 % injection 10-40 mL, 10-40 mL, Intracatheter, PRN, Babcock, Peter E, NP   thiamine  (VITAMIN B1) tablet 100 mg, 100 mg, Oral, Daily, Kc, Ramesh, MD, 100 mg at 06/16/24 0932   torsemide  (DEMADEX ) tablet 20 mg, 20 mg, Oral, Daily, Kc, Ramesh, MD, 20 mg at 06/16/24 0932   vancomycin  (VANCOCIN ) capsule 125 mg, 125 mg, Oral, Daily, Babcock, Peter E, NP, 125 mg at 06/16/24 1110  Allergies: Allergies  Allergen Reactions   Brimonidine Tartrate Shortness Of Breath     Alphagan-Shortness of breath   Brinzolamide Shortness Of Breath    AZOPT- Shortness of breath   Latanoprost Shortness Of Breath    XALATAN- Shortness of breath   Nucynta  [Tapentadol ] Shortness Of Breath   Sulfa Antibiotics Palpitations   Timolol Maleate Shortness Of Breath and Other (See Comments)    TIMOPTIC- Aggravated asthma   Diltiazem Swelling     leg swelling   Rofecoxib Swelling     VIOXX- leg swelling   Vancomycin  Rash    Tolerates oral vanc   Codeine Other (See Comments)    Childhood reaction   Tamsulosin Other (See Comments)    Dizziness    Celecoxib Other (See Comments)    CELLBREX-confusion   Colchicine Diarrhea    diarrhea   Tape Rash    Objective  Vital signs:  Temp:  [98.4 F (36.9 C)] 98.4 F (36.9 C) (12/14 1603) Pulse Rate:  [84-92] 84 (12/15 0800) Resp:  [17-20] 18 (12/15 0800) BP: (97-108)/(42-86) 108/51 (12/15 0800) SpO2:  [96 %-99 %] 96 % (12/15 0800)  Psychiatric Specialty Exam:  Presentation  General Appearance: Appropriate for Environment  Eye Contact:Good Speech: WDL Speech Volume:WDL   Mood and Affect  Mood: some depression related to medical and functional issues Affect:appropriate   Thought Process  Thought Processes:Coherent  Descriptions of Associations:Intact  Orientation: alert and oriented times three Thought Content:Logical  History of Schizophrenia/Schizoaffective disorder:No data recorded Duration of Psychotic Symptoms:No data recorded Hallucinations:Hallucinations: None    Ideas of Reference:None  Suicidal Thoughts:Suicidal Thoughts: No    Homicidal Thoughts:Homicidal Thoughts: No     Sensorium  Memory:Immediate Fair; Recent Fair  Judgment:Fair  Insight:Fair   Executive Functions  Concentration: Good Attention Span:Good Recall: Fund of Knowledge:Good Language:Good  Psychomotor Activity  Psychomotor Activity:Psychomotor Activity: Normal     Assets  Assets:Communication Skills; Desire  for Improvement; Housing; Social Support; Resilience   Sleep  Sleep: he believes not good  Physical Exam: Physical Exam Constitutional:      General: He is not in acute distress.    Appearance: He is not ill-appearing.  Pulmonary:     Effort: Pulmonary effort is normal.  Neurological:     General: No focal deficit present.     Mental Status: He is alert and oriented to person, place, and time.    Review of Systems  Constitutional:  Negative for malaise/fatigue.  HENT: Negative.    Eyes: Negative.   Respiratory: Negative.    Cardiovascular: Negative.   Gastrointestinal: Negative.   Genitourinary: Negative.   Musculoskeletal: Negative.   Skin: Negative.   Neurological:        C/o neuropathic pain  Endo/Heme/Allergies: Negative.   Psychiatric/Behavioral:  Negative for depression, hallucinations and suicidal ideas. The patient is not nervous/anxious and does not have insomnia.    Blood pressure (!) 108/51, pulse 84, temperature 98.4 F (36.9 C), temperature source Oral, resp. rate 18, height 6' 4 (1.93 m), weight 118.2 kg, SpO2 96%. Body mass index is 31.72 kg/m.

## 2024-06-17 ENCOUNTER — Telehealth: Payer: Self-pay

## 2024-06-17 ENCOUNTER — Other Ambulatory Visit (HOSPITAL_COMMUNITY): Payer: Self-pay

## 2024-06-17 LAB — BASIC METABOLIC PANEL WITH GFR
Anion gap: 9 (ref 5–15)
BUN: 13 mg/dL (ref 8–23)
CO2: 30 mmol/L (ref 22–32)
Calcium: 7.7 mg/dL — ABNORMAL LOW (ref 8.9–10.3)
Chloride: 100 mmol/L (ref 98–111)
Creatinine, Ser: 0.61 mg/dL (ref 0.61–1.24)
GFR, Estimated: >60 mL/min (ref >60)
Glucose, Bld: 95 mg/dL (ref 70–99)
Potassium: 2.9 mmol/L — ABNORMAL LOW (ref 3.5–5.1)
Sodium: 139 mmol/L (ref 135–145)

## 2024-06-17 LAB — CBC
HCT: 28.6 % — ABNORMAL LOW (ref 39.0–52.0)
Hemoglobin: 9.4 g/dL — ABNORMAL LOW (ref 13.0–17.0)
MCH: 31.8 pg (ref 26.0–34.0)
MCHC: 32.9 g/dL (ref 30.0–36.0)
MCV: 96.6 fL (ref 80.0–100.0)
Platelets: 137 K/uL — ABNORMAL LOW (ref 150–400)
RBC: 2.96 MIL/uL — ABNORMAL LOW (ref 4.22–5.81)
RDW: 15.4 % (ref 11.5–15.5)
WBC: 5.3 K/uL (ref 4.0–10.5)
nRBC: 0 % (ref 0.0–0.2)

## 2024-06-17 LAB — GLUCOSE, CAPILLARY
Glucose-Capillary: 92 mg/dL (ref 70–99)
Glucose-Capillary: 117 mg/dL — ABNORMAL HIGH (ref 70–99)
Glucose-Capillary: 150 mg/dL — ABNORMAL HIGH (ref 70–99)

## 2024-06-17 LAB — MAGNESIUM: Magnesium: 1.6 mg/dL — ABNORMAL LOW (ref 1.7–2.4)

## 2024-06-17 MED ORDER — MAGNESIUM SULFATE 2 GM/50ML IV SOLN
2.0000 g | Freq: Once | INTRAVENOUS | Status: AC
  Administered 2024-06-17: 10:00:00 2 g via INTRAVENOUS
  Filled 2024-06-17: qty 50

## 2024-06-17 MED ORDER — POTASSIUM CHLORIDE CRYS ER 20 MEQ PO TBCR
40.0000 meq | EXTENDED_RELEASE_TABLET | ORAL | Status: AC
  Administered 2024-06-17 (×2): 40 meq via ORAL
  Filled 2024-06-17 (×2): qty 2

## 2024-06-17 MED ORDER — POTASSIUM CHLORIDE 10 MEQ/100ML IV SOLN
10.0000 meq | Freq: Once | INTRAVENOUS | Status: AC
  Administered 2024-06-17: 07:00:00 10 meq via INTRAVENOUS
  Filled 2024-06-17: qty 100

## 2024-06-17 MED ORDER — ENSURE PLUS HIGH PROTEIN PO LIQD
237.0000 mL | Freq: Three times a day (TID) | ORAL | Status: DC
  Administered 2024-06-17 – 2024-06-25 (×16): 237 mL via ORAL

## 2024-06-17 MED ADMIN — THERA M PLUS PO TABS: 1 | ORAL | @ 10:00:00 | NDC 1650058694

## 2024-06-17 NOTE — TOC Progression Note (Signed)
 Transition of Care [name]) - Progression Note    Patient Details  Name: Tim [name] MRN: [id number] Date of Birth: 1941-06-04  [facility] of [facility]) CM/SW Contact  Montie LOISE Louder, [place] Phone Number: 06/17/2024, 11:46 AM  Clinical Narrative:     PASRR pending- state reviewer Nelwyn Shoulder will come and assess patient today @ 1pm.  Montie Louder, MSW, LCSW Clinical Social Worker  Expected Discharge Plan: Skilled Nursing Facility Barriers to Discharge: Awaiting State Approval THEONE), Continued Medical Work up               Expected Discharge Plan and Services In-house Referral: Clinical Social Work Discharge Planning Services: CM Consult Post Acute Care Choice: Home Health, Resumption of Svcs/PTA Provider Living arrangements for the past 2 months: Single Family Home                           HH Arranged: RN, PT HH Agency: [facility] Date HH Agency Contacted: 06/10/24 Time HH Agency Contacted: 1455 Representative spoke with at [name] Agency: [facility]   Social Drivers of Health (SDOH) Interventions SDOH Screenings   Food Insecurity: No Food Insecurity (06/07/2024)  Housing: Low Risk (06/07/2024)  Recent Concern: Housing - High Risk (05/01/2024)   Received from [facility]  Transportation Needs: No Transportation Needs (06/07/2024)  Utilities: Not At Risk (06/07/2024)  Depression (PHQ2-9): Low Risk (03/31/2024)  Financial Resource Strain: Low Risk (09/23/2023)  Physical Activity: Unknown (09/23/2023)  Social Connections: Moderately Isolated (06/07/2024)  Stress: No Stress Concern Present (05/01/2024)   Received from [facility]  Tobacco Use: Low Risk (06/05/2024)    Readmission Risk Interventions    04/15/2024    3:20 PM 04/14/2024    4:33 PM  Readmission Risk Prevention Plan  Transportation Screening Complete Complete  PCP or Specialist Appt within 5-7 Days Complete Complete  Home Care Screening Complete Complete  Medication  Review (RN CM) Complete Referral to Pharmacy

## 2024-06-17 NOTE — Plan of Care (Signed)

## 2024-06-17 NOTE — Progress Notes (Signed)
 PROGRESS NOTE Tim [name]  FMW:[id number] DOB: [birth year] DOA: 06/05/2024 PCP: [name], MD  Brief Narrative/Hospital Course: Tim [name] is a 83 y.o. 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 Patient has history of enterococcal infection of the AICD lead that is being treated with chronic amoxicillin .   Recent admission at [facility] on 10/9-10/17:mixed hypovolemic/septic shock. At [facility] on 10/29-11/1:[name] treated w/ vanc. Wife states he has not had diarrhea symptoms and off po vanc. Did have diarrhea on 12/3 and so did wife. Wife also states he has poor po intake.  On 12/4 overnight patient had high fever. EMS called found temp of 103.7. In [name] 99.3 F and wbc 6.7. LA 5. Chronic foley in place w/ hematuria which was replaced. UA inconclusive. INR 3.7. CXR unremarkable. Cultures obtained and started on broad spectrum abx. Despite fluids remains hypotensive started on levoPHED  12/5 admit to [facility] 12/6 spiked fever with Tmax of 100.7, blood culture is growing ESBL gram-negative rods with E. coli/Klebsiella.  Vasopressor requirement improving, Levophed  at 13, vasopressin  at 0.03 unit.  Foley catheter was exchanged by urology, no more hematuria 12/8 norepinephrine  requirements coming down.  Platelet count stabilized.  LFTs climbing compared to last 72 hours 12/10: Transferred to [name] service 12-11/12 having episodes of vomiting x-ray shows ileus versus gastroparesis> has resolved and tolerating p.o. diet  Subjective: Seen and examined this am Patient wife at the bedside, he appears more alert awake today Urine is clear without hematuria on Foley bag Overnight remains afebrile BP soft in 100s. Labs with  lo k and low mag hb stable 9-10g Wt 260  lb?  Assessment and plan:  Septic shock-shock resolved Kleb pneumonia E coli bacteremia UTI-with history of BPH and chronic Foley, urine retention Leukemoid reaction with leukocytosis Hematuria due to traumatic Foley insertion- resolved: Shock resolved but needing midodrine  due to soft BP. Blood culture12/4-5: E. coli and Klebsiella - repeat blood culture 12/9 NGTD  Urine culture- E. coli positive and hematuria Foley changed by urology 12/11 [name] Had significantly leukocytosis 56k - which is resolved.  Completed meropenem   and now on home Amoxicillin  12/15>>  and on oral vancomycin  for [name] prophylaxis- d/c 12/17 Needs outpatient follow-up with Dr. Renda regarding his urological issues I have reached out to Dr. Maryfrances is okay with trial of anticoagulation while inpatient since no hematuria if needed-will defer to cardiology Consider voiding trial as he is more stable and is able to ambulate. long-term would consider if he is a candidate for suprapubic catheter.   Aortic stenosis status post TAVR. Last TEE in October 2025 showed ejection fraction 65 to 70%  Anasarca:Volume overload with net balance Continue home torsemide .  Having good diuresis monitor electrolytes and replace. Net IO Since Admission: -5,890.34 mL [06/17/24 1114]   Poor intake VomitingX2 on 12/12 Possible ileus versus gastroparesis on imaging: Repeat x-ray improved, initially on Reglan  and will discontinue.  Tolerating diet.  No more nausea vomiting.  Having bowel movement.    Hypokalemia Hypomagnesemia: Replace aggressively, recheck.  Paroxysmal A-fib with RVR: H/o CHB, has PPM History of hypertension-his Coreg amlodipine losartan  has been on hold since October: Rate currently controlled.Holding home Coreg and aspirin  in the setting of thrombocytopenia.  His Eliquis  has been on hold since October ( he had  gross hematuira and eliquis  was held that time)- This admit home RN exchanged Foley and  he had gross hematuria after that and admitted to [facility] with septic shock- 12/15 Discussed w/ Dr Renda from uro- if hematuria from traumatic foley and since urine is clear now- can do eliquis  trial if he needs it-cardiology input recommended defer to cardiology for anticoagulation,.  He has been tolerating DVT prophylaxis without issues for 2 days now Needs PPM replacement Feb 2026-patient reports she has not been able to get back to her cardiologist,.  Thrombocytopenia: likely 2/2 sepsis and antibiotics, and stable >100k-    Anemia of chronic illness Monitor and transfuse for Hb <7   Hyperglycemia Stable, cont SSI PRN   Goals of care Deconditioning/debility Recurrent hospitalization Poor oral intake question rehab potential, continue PT OT aggressively.  Patient is DNR limited. he has had very poor intake very deconditioned with complex medical comorbidities Created space and opportunity for patient to explore thoughts, feelings and fears regarding current medical situation-and also provided therapeutic listening -wife is very overwhelmed with his medical condition and his overall decline-and also anxious about his rehab. Will continue to address goals of care continue to treat reversible causes. Continue to augment diet, continue Remeron .  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 Mental status fairly stable but he is very forgetful-.  Decondition. Continue delirium precaution frequent reorientation PT OT and mobilize   Chronic back pain on chronic opiates: Patient reports no surgeon could fix his back, PTA on oxycodone  20 mg prn, gabapentin  800.  Continue pain meds and low-dose gabapentin  as tolerated  H/o C diff Continue  suppressive vancomycin  while on meropenem .No diarrhea but constipation at times   Elevated transaminases: Likely in the setting of shock, resolving.     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  Now on Remeron  or Cymbalta  due to vomiting, also stopping Abilify  can resume if has paranoid thoughts again   ? Trigeminy in monitor: Has paced rhythm, asymptomatic monitor electrolytes continue telemetry.  Continue to replace electrolytes  Class I Obesity w/ Body mass index is 31.72 kg/m.: Will benefit with PCP follow-up, weight loss,healthy lifestyle and outpatient sleep eval if not done.  Left upper extremity single-lumen  midline 12/9 Foley in place   Mobility: PT Orders: Active PT Follow up Rec: Skilled Nursing-Short Term Rehab (<3 Hours/Day)06/16/2024 1400   DVT prophylaxis: SCDs Start: 06/06/24 0316 lovenox  Code Status:   Code Status: Limited: Do not attempt resuscitation (DNR) -DNR-LIMITED -Do Not Intubate/DNI  Family Communication: plan of care discussed with patient/wife at bedside updated Patient status is: Remains hospitalized because of severity of illness Level of care: Progressive   Dispo: The patient is from: HOME            Anticipated disposition: Hopefully SNF once has bed  Objective: Vitals last 24 hrs: Vitals:   06/17/24 0332 06/17/24 0754 06/17/24 0800 06/17/24 1107  BP: (!) 128/51 (!) 106/49 (!) 116/50 (!) 108/55  Pulse: 91 92 90 89  Resp: 20 15 12 20   Temp: 98.4 F (36.9 C) 98.3 F (36.8 C)  98.3 F (36.8 C)  TempSrc: Oral Oral  Oral  SpO2: 96% 99% 100% 94%  Weight:      Height:  Physical Examination: General exam: AOO weak obese frail much older than the stated age HEENT:Oral mucosa moist, Ear/Nose WNL grossly Respiratory system: Bilaterally diminished BS, no use of accessory muscle Cardiovascular system: S1 & S2 +, No JVD. Gastrointestinal system: Abdomen  soft, obese with NT BS+ Nervous System: Alert, awake, moving all extremities,and following commands. Extremities: extremities warm, leg edema trace, left TMA old healed. Skin: Warm, no rashes MSK: Normal muscle bulk,tone, power   Medications reviewed:  Scheduled Meds:  acetaminophen   650 mg Oral TID   amoxicillin   1,000 mg Oral BID   arformoterol   15 mcg Nebulization Q12H   budesonide  (PULMICORT ) nebulizer solution  0.5 mg Nebulization BID   Chlorhexidine  Gluconate Cloth  6 each Topical Daily   collagenase   1 Application Topical Daily   enoxaparin  (LOVENOX ) injection  60 mg Subcutaneous Q24H   ezetimibe   10 mg Oral Daily   famotidine   20 mg Oral BID   feeding supplement  237 mL Oral TID BM   gabapentin   100 mg Oral TID   Gerhardt's butt cream   Topical BID   insulin  aspart  0-6 Units Subcutaneous TID AC & HS   lidocaine   1 patch Transdermal Q24H   lidocaine   1 Application Urethral Once   metoCLOPramide  (REGLAN ) injection  5 mg Intravenous Q8H   mirtazapine   15 mg Oral QHS   multivitamin with minerals  1 tablet Oral Daily   ranolazine   500 mg Oral BID   revefenacin   175 mcg Nebulization Daily   rosuvastatin   20 mg Oral Daily   sodium chloride  flush  10-40 mL Intracatheter Q12H   thiamine   100 mg Oral Daily   torsemide   20 mg Oral Daily   vancomycin   125 mg Oral Daily   Continuous Infusions:  magnesium  sulfate bolus IVPB 2 g (06/17/24 1022)    Diet: Diet Order             DIET SOFT Room service appropriate? Yes with Assist; Fluid consistency: Thin  Diet effective now                 Sick patient will resume Data Reviewed: I have personally reviewed following labs and imaging studies ( see epic result tab) CBC: Recent Labs  Lab 06/13/24 1130 06/14/24 0249 06/15/24 0330 06/16/24 0626 06/17/24 0301  WBC 8.6 9.1 5.7 6.2 5.3  HGB 10.1* 9.8* 9.2* 10.2* 9.4*  HCT 30.3* 29.6* 27.8* 32.3* 28.6*  MCV 95.6 98.0 98.6 100.0 96.6  PLT 111* 123* 106* 126* 137*    CMP: Recent Labs  Lab 06/13/24 0656 06/14/24 0249 06/15/24 0330 06/16/24 0626 06/17/24 0301  NA 137 141 139 137 139  K 4.1 3.3* 3.4* 4.0 2.9*  CL 103 106 103 103 100  CO2 23 28 29 24 30   GLUCOSE 138* 106* 85 75 95  BUN 16 14 13 15 13   CREATININE 0.66 0.55* 0.63 0.68 0.61  CALCIUM  7.7* 8.0* 7.6* 7.8* 7.7*  MG 1.5* 1.4* 1.4* 1.8 1.6*   GFR: Estimated Creatinine Clearance: 96.6 mL/min (by C-G formula based on SCr of 0.61 mg/dL). Recent Labs  Lab 06/10/24 1150 06/11/24 0355  AST  --  202*  ALT  --  196*  ALKPHOS  --  212*  BILITOT 1.1 1.1  PROT  --  4.4*  ALBUMIN  --  1.8*   No results for input(s): LIPASE, AMYLASE in the last 168 hours.  Recent Labs  Lab 06/11/24 0355  AMMONIA <13   Coagulation Profile:  Recent Labs  Lab 06/10/24 1150  INR 1.2   Unresulted Labs (From admission, onward)     Start     Ordered   06/18/24 0500  Basic metabolic panel with GFR  Tomorrow morning,   R       Question:  Specimen collection method  Answer:  Lab=Lab collect   06/17/24 1110   06/18/24 0500  CBC  Tomorrow morning,   R       Question:  Specimen collection method  Answer:  Lab=Lab collect   06/17/24 1110   06/07/24 0500  Magnesium   Daily,   R     Question:  Specimen collection method  Answer:  Lab=Lab collect   06/06/24 1126           Antimicrobials/Microbiology: Anti-infectives (From admission, onward)    Start     Dose/Rate Route Frequency Ordered Stop   06/16/24 1000  amoxicillin  (AMOXIL ) capsule 1,000 mg        1,000 mg Oral 2 times daily 06/09/24 1406     06/10/24 1000  vancomycin  (VANCOCIN ) capsule 125 mg        125 mg Oral Daily 06/09/24 1406 06/18/24 0959   06/09/24 1600  meropenem  (MERREM ) 1 g in sodium chloride  0.9 % 100 mL IVPB        1 g 200 mL/hr over 30 Minutes Intravenous Every 8 hours 06/09/24 1406 06/16/24 0759   06/07/24 1000  vancomycin  (VANCOCIN ) capsule 125 mg  Status:  Discontinued        125 mg Oral Daily 06/06/24 1432 06/09/24 1406    06/06/24 1000  linezolid  (ZYVOX ) IVPB 600 mg  Status:  Discontinued        600 mg 300 mL/hr over 60 Minutes Intravenous Every 12 hours 06/06/24 0704 06/06/24 1432   06/06/24 0800  ceFEPIme  (MAXIPIME ) 2 g in sodium chloride  0.9 % 100 mL IVPB  Status:  Discontinued        2 g 200 mL/hr over 30 Minutes Intravenous Every 8 hours 06/06/24 0311 06/06/24 0704   06/06/24 0800  meropenem  (MERREM ) 1 g in sodium chloride  0.9 % 100 mL IVPB  Status:  Discontinued        1 g 200 mL/hr over 30 Minutes Intravenous Every 8 hours 06/06/24 0712 06/09/24 1406   06/06/24 0600  vancomycin  (VANCOCIN ) capsule 500 mg  Status:  Discontinued        500 mg Oral Every 6 hours 06/06/24 0310 06/06/24 1432   06/06/24 0400  metroNIDAZOLE  (FLAGYL ) IVPB 500 mg  Status:  Discontinued        500 mg 100 mL/hr over 60 Minutes Intravenous Every 8 hours 06/06/24 0310 06/06/24 0705   06/06/24 0200  vancomycin  (VANCOREADY) IVPB 2000 mg/400 mL  Status:  Discontinued        2,000 mg 100 mL/hr over 240 Minutes Intravenous  Once 06/06/24 0153 06/06/24 0705   06/06/24 0145  ampicillin  (OMNIPEN) 2 g in sodium chloride  0.9 % 100 mL IVPB        2 g 300 mL/hr over 20 Minutes Intravenous  Once 06/06/24 0139 06/06/24 0328   06/06/24 0000  ceFEPIme  (MAXIPIME ) 2 g in sodium chloride  0.9 % 100 mL IVPB        2 g 200 mL/hr over 30 Minutes Intravenous  Once 06/05/24 2358 06/06/24 0118         Component Value Date/Time   SDES BLOOD RIGHT HAND 06/10/2024 1813   SPECREQUEST  06/10/2024 1813  BOTTLES DRAWN AEROBIC AND ANAEROBIC Blood Culture results may not be optimal due to an inadequate volume of blood received in culture bottles   CULT  06/10/2024 1813    NO GROWTH 5 DAYS Performed at [facility], [address], [place], [place] [address]    REPTSTATUS 06/15/2024 FINAL 06/10/2024 1813    Procedures: Mennie LAMY, MD [facility] 06/17/2024, 11:15 AM

## 2024-06-17 NOTE — Progress Notes (Signed)
 Occupational Therapy Treatment Patient Details Name: Tim [name] MRN: [id number] DOB: February 07, 1941 Today's Date: 06/17/2024   History of present illness The pt is an 83 yo male admitted 12/4 with fever 103.7, found to have ESBL klebsiella ecoli bacteremia and UTI. Hospital stay complicated by hypotension requiring pressors and ICU, and acute metabolic encephalopathy. PMH includes: Cdiff 10/27-11/5 at [facility], HTN TAVR, hx of endocarditis, mitral valve lesion, AKI, CAD, PAF, CAD s/p PCI, complete heart block s/p PPM, HTN, a fib, DM, OSA, depression, AICD, glaucoma   OT comments  Pt presented in bed with wife at the start of session. Pt needed increase in time but then started to become more awake in session. He completed supine to sit with max assist to EOB and then was able to complete sitting with mod-CGA while engaging in self care for about 15-18 mins. Pt then noted to have had BM and required max assist for sitting to supine and then had peri care completed with total assist at bed level. He then needed total x2 to reposition to [facility]. Patient will benefit from continued inpatient follow up therapy, <3 hours/day but wife may decline and if so, then recommendation for [facility] and they will require medical transport with the return to home.       If plan is discharge home, recommend the following:  Two people to help with walking and/or transfers;Two people to help with bathing/dressing/bathroom;Assistance with cooking/housework;Assist for transportation   Equipment Recommendations  Other (comment) (TBD at next venue)    Recommendations for Other Services      Precautions / Restrictions Precautions Precautions: Fall Recall of Precautions/Restrictions: Impaired       Mobility Bed Mobility Overal bed mobility: Needs Assistance Bed Mobility: Rolling, Supine to Sit, Sit to Supine Rolling: Mod assist, Max assist, Used rails   Supine to sit: Mod assist, HOB elevated, Used rails Sit to  supine: Max assist, HOB elevated, Used rails   General bed mobility comments: with increase in repeating of rolling had improved follow through in session    Transfers                   General transfer comment: deffered     Balance Overall balance assessment: Needs assistance Sitting-balance support: Feet supported Sitting balance-Leahy Scale: Fair Sitting balance - Comments: intially needed heacy mod assist but then progressed to CGA Postural control: Posterior lean                                 ADL either performed or assessed with clinical judgement   ADL Overall ADL's : Needs assistance/impaired Eating/Feeding: Set up;Sitting Eating/Feeding Details (indicate cue type and reason): to encourage to eat may need min assist Grooming: Brushing hair;Set up;Sitting;Contact guard assist;Minimal assistance   Upper Body Bathing: Sitting;Minimal assistance   Lower Body Bathing: Maximal assistance;Total assistance;Bed level   Upper Body Dressing : Minimal assistance   Lower Body Dressing: Maximal assistance;Total assistance;Bed level       Toileting- Clothing Manipulation and Hygiene: Total assistance;Bed level              Extremity/Trunk Assessment Upper Extremity Assessment Upper Extremity Assessment: Generalized weakness (BUE edema)   Lower Extremity Assessment Lower Extremity Assessment: Defer to PT evaluation        Vision   Vision Assessment?: No apparent visual deficits   Perception     Praxis     Communication Communication  Communication: Impaired Factors Affecting Communication: Hearing impaired;Reduced clarity of speech   Cognition Arousal: Lethargic Behavior During Therapy: Flat affect               OT - Cognition Comments: Pt needs increase in time to motor plan. sequence                 Following commands: Impaired Following commands impaired: Follows one step commands inconsistently, Only follows one step  commands consistently      Cueing   Cueing Techniques: Verbal cues, Tactile cues  Exercises      Shoulder Instructions       General Comments Spoke to wife about rec for SNF but she may want to go [facility]. The last time attempted to stand was with [facility] at home.    Pertinent Vitals/ Pain       Pain Assessment Pain Assessment: Faces Faces Pain Scale: Hurts little more Pain Location: Back Pain Descriptors / Indicators: Discomfort, Grimacing, Crying, Moaning, Guarding Pain Intervention(s): Limited activity within patient's tolerance, Monitored during session, Repositioned  Home Living                                          Prior Functioning/Environment              Frequency  Min 2X/week        Progress Toward Goals  OT Goals(current goals can now be found in the care plan section)  Progress towards OT goals: Progressing toward goals  Acute Rehab OT Goals Patient Stated Goal: none OT Goal Formulation: With patient Time For Goal Achievement: 06/25/24 Potential to Achieve Goals: Fair ADL Goals Pt Will Perform Lower Body Dressing: with mod assist;sit to/from stand Pt Will Transfer to Toilet: with mod assist;stand pivot transfer;bedside commode Pt Will Perform Toileting - Clothing Manipulation and hygiene: with mod assist;sitting/lateral leans;sit to/from stand Additional ADL Goal #1: Pt will complete bed mobility with min assist as precursor for ADLs  Plan      Co-evaluation                 AM-PAC OT 6 Clicks Daily Activity     Outcome Measure   Help from another person eating meals?: A Little Help from another person taking care of personal grooming?: A Little Help from another person toileting, which includes using toliet, bedpan, or urinal?: Total Help from another person bathing (including washing, rinsing, drying)?: A Lot Help from another person to put on and taking off regular upper body clothing?: A Little Help from another  person to put on and taking off regular lower body clothing?: A Lot 6 Click Score: 14    End of Session    OT Visit Diagnosis: Unsteadiness on feet (R26.81);Other abnormalities of gait and mobility (R26.89);Muscle weakness (generalized) (M62.81)   Activity Tolerance Patient tolerated treatment well   Patient Left in bed;with call bell/phone within reach;with bed alarm set   Nurse Communication Mobility status        Time: 9174-9074 OT Time Calculation (min): 60 min  Charges: OT General Charges $OT Visit: 1 Visit OT Treatments $Self Care/Home Management : 53-67 mins  Warrick POUR OTR/L  Acute Rehab Services  [phone] office number   Warrick Berber 06/17/2024, 9:33 AM

## 2024-06-17 NOTE — Progress Notes (Signed)
 Daily Progress Note   Patient Name: Tim [name]       Date: 06/17/2024 DOB: 08/01/1940  Age: 83 y.o. MRN#: [id number] Attending Physician: Christobal Guadalajara, MD Primary Care Physician: [name], MD Admit Date: 06/05/2024 Length of Stay: 11 days  Reason for Consultation/Follow-up: Establishing goals of care  Subjective:  Subjective: Chart reviewed including most recent notes from hospitalist, OT, PT, RD, and TOC.  Labs reviewed and note sodium 139, potassium 2.9, creatinine 0.61, mag 1.6, WBC 5.3, hemoglobin 9.4.  I saw and examined Mr. Ostermiller today.  His wife was at the bedside as well.  Patient was sleeping at time of my arrival.  His wife reports that he had a very busy morning working with PT and OT and they have worn him out.  We talked again about her concerns with ensuring that he gets the best care possible at time of transition to skilled facility.  Mr. Whiteley then woke up and reports that he feels as though he is having a good day.  Denies pain currently.  Discussed his appetite and he reports that it remains low.  He states that he wants to work with therapy, however, it is frustrating to see how weak he is and much work is ahead of him to regain his strength.  Discussed slow nature of progress following prolonged hospitalization.  His ultimate goal remains to get strong enough to return home with his wife.  We discussed again patient and his wife's concerns about plans for discharge to a skilled facility.  They both state understanding that he needs rehab to regain his strength, but they also feel that he had such a bad experience the last time that he went to rehab they are anxious about what lies ahead.  He then expressed, we will figure it out.  It is what I need to do to get stronger.  We again reviewed reasons for their anxiety and strategies them both to feel better about choice of facility. Discussed that TOC could provide information on offers and recommended that  they review medicare start ratings followed by [name] visiting the facility they choose after they have narrowed down choices.  Reports she has the site saved with Medicare star ratings on her iPad.  We talked about [name] being overwhelmed and encouraged her to take time for herself and self-care.  Palliative to continue to follow.  Review of Systems Denies complaints reports appetite down a little but mood better today Objective:   Vital Signs:  BP (!) 108/55 (BP Location: Right Arm)   Pulse 89   Temp 98.3 F (36.8 C) (Oral)   Resp 20   Ht 6' 4 (1.93 m)   Wt 118.2 kg   SpO2 94%   BMI 31.72 kg/m   Physical Exam: General: NAD, alert, confusion resolved today Eyes:  anicteric sclera HENT: normocephalic, atraumatic, moist mucous membranes Cardiovascular: RRR Respiratory: no increased work of breathing noted, not in respiratory distress Abdomen: not distended Neuro: A&O, following commands easily Psych: flat affect, tangential with circular thought patterns  Imaging: @IMAGES @  I personally reviewed recent imaging.   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  Recommendations/Plan: # Complex medical decision making/goals of care:  -  Continue current care.  Plan for  SNF for rehab when he is medically ready to discharge.    - We had another discussion today regarding prior experience at skilled facility and there are concerns regarding this.  They both understand that his wife cannot meet his care needs at home and safest option discharge with a goal of regaining strength would be rehab at skilled facility.  Overall, his goal remains to return home when he completes rehab.   -  Code Status: Limited: Do  not attempt resuscitation (DNR) -DNR-LIMITED -Do Not Intubate/DNI   Prognosis: Unable to determine  # Discharge Planning: SNF with palliative care to follow  Discussed with: patient, wife, bedside care team Thank you for allowing the palliative care team to participate in the care [name].  Amaryllis Meissner, MD Palliative Care Provider PMT # [phone]  If patient remains symptomatic despite maximum doses, please call PMT at [phone] between 0700 and 1900. Outside of these hours, please call attending, as PMT does not have night coverage.   I personally spent a total of 52 minutes in the care of the patient today including preparing to see the patient, getting/reviewing separately obtained history, performing a medically appropriate exam/evaluation, counseling and educating, referring and communicating with other health care professionals, and documenting clinical information in the EHR.

## 2024-06-17 NOTE — Consult Note (Addendum)
 Cardiology Consultation   Patient ID: Tim [name] MRN: [id number]; DOB: 1940/11/20  Admit date: 06/05/2024 Date of Consult: 06/17/2024  PCP:  [name], MD   [facility] Providers Cardiologist:  Redell Shallow, MD     Patient Profile: Tim [name] is a 83 y.o. 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  History of Present Illness: Tim [name] is a 83 year old male with prior cardiac history listed below.  The patient has been followed by cardiology at [facility].  On 03/2016 the patient received a bare-metal stent to the RCA.  On 04/2016 the patient had TAVR for severe aortic stenosis.   About this time the patient was received a pacemaker for complete heart block.  Later on in 12/2019 the patient did have endocarditis to this prosthetic valve and ICD lead.  On 10/2016 the patient received PTCA for in-stent restenosis of the RCA  On 04/2017 the patient received a drug-eluting stent to the RCA for in-stent restenosis.  TTE on 05/2017 showed a reduced LVEF of 35%.  On 03/2018 the pacemaker was upgraded to a biventricular ICD. TTE on 04/2018 showed a reduced LVEF of 40 to 45%.   Had an NSTEMI on 03/2024 at [facility].  A cardiac catheterization was performed that showed a normal left main, 25% stenosis in the mid LAD, 95% stenosis in the ostial diagonals, normal LCx, and 60% stenosis in the mid RCA.  It was felt like the ostial disease in the diagonals was the cause of the NSTEMI.  The branch was too small to intervene and this was managed medically with aspirin , Eliquis , and statin.  On 04/10/2024 the patient was hospitalized at [facility].  During his hospitalization he was followed by  cardiology.  He had a TEE performed that showed normal LVEF of 65 to 70%, mild LVH, normal RV function, old vegetation on anterior leaflet of mitral valve, replaced aortic valve, severe prosthetic aortic valve stenosis (VTI 0.55 cm).  The patient was recommended to follow-up with the structural team at [facility].  It does not appear like the patient was able to make it to that appointment.  During the hospitalization his Eliquis  was stopped due to concerns of hematuria.   The patient was hospitalized at [facility] on 04/30/2024 for a partial intestinal obstruction.    Device check on 05/13/2024 showed normal device function and 3 months of battery life left.  The patient was hospitalized at [facility] for concerns of septic shock, bacteremia, and UTI.  Patient also had an episode of gross hematuria this hospitalization.  On interview patient reported that he did have a significant amount of hematuria this hospitalization.  Reported that he has a pounding sensation in his chest with atrial fibrillation. The patient's wife reported that she was concerned because of his severe aortic stenosis and depleting battery and ICD.  Reported that they have a difficult time making outpatient follow-up appointments because of how many times he has been hospitalized in the last few months.  Stated that the patient is planning to go to rehab and this could be for several months.  Labs showed thrombocytopenia with a platelet count of 137, hypokalemia of 2.9, renin of 0.61, calcium  of 7.7, hypomagnesemia of 1.6, WBC count of  5.3, and anemia with a hemoglobin of 9.4.  EKG showed an a sensed V paced rhythm with a rate of 86.   Past Medical History:  Diagnosis Date   AICD (automatic cardioverter/defibrillator) present    Balanitis    +severe phimosis+ buried penis->circ not possible but dorsal slit done 10/2019   BPH (benign prostatic hypertrophy)    with urinary retention.  Renal u/s 12/04/19 NORMAL KIDNEYS, NORMAL  BLADDER, NO HYDRONEPHROSIS   CAD (coronary artery disease)    Nonobstructive by 12/2012 cath; then 03/2016 he required BMS to RCA ([facility]).  In-stent restenosis on cath 11/01/16, baloon angioplasty successful.   CATARACT, HX OF    Chronic combined systolic and diastolic CHF (congestive heart failure) (HCC) 05/2016   Ischemic CM.  04/2018 EF 40-45%, grd I DD.  07/2021 EF 55-60%, AV well seated   Chronic pain syndrome    Lumbar DDD; chronic neuropathic pain (DM); has spinal stimulator and sees pain mgmt MD   Complicated UTI (urinary tract infection) 09/2019   Phimoses, acute urinary retention, entoerococcus UTI, enterococc bacteremia.  F/u blood clx's neg x 5d.     COPD    Debilitated patient    Diabetes mellitus type 2 with complications (HCC)    HbA1c jumped from 5.7% to 6.1% 03/2015---started metformin  at that time.  DM 2 dx by fasting gluc criteria 2018.  Has chronic neuropath pain   Enterococcal bacteremia 09/2019   Phimoses, acute urinary retention, entoerococcus UTI, enterococc bacteremia.  F/u blood clx's neg x 5d.     Essential hypertension, benign    Fatty liver 2007   2007 u/s showed fatty liver with hepatosplenomegaly.  2019 repeat u/s->fatty liver but no cirrhosis or hepatosplenomegaly.   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 Fleeta Rothman)   Iron deficiency anemia    09/2022.  Hemoccults x 3 neg Mar 2024.  Improving with oral iron. GI following, no endoscopies   Lumbosacral neuritis    Lumbosacral spondylosis    Lumbar spinal stenosis with neurogenic claudication--contributes to his chronic pain syndrome   Morbid obesity (HCC)     Normal memory function 08/2014   Neuropsychological testing ([facility]): no cognitive impairment or sign of neurodegenerative disorder.  Likely has adjustment d/o with mixed anxiety/depressed features and may benefit from low dose SNRI.     Normocytic anemia 03/2016   Mild-pt needs ferritin and vit B12 level checked (as of 03/22/16). Hb stable 09/2019.  Hemoccult x 3 NEG 03/2022.  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 chronic?) Off anticoag for a while due to falls.  Then apixaban  started 12/2014.   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 Clorinda): neuropathic pain->diff to treat, failed nucynta , failed spinal stimul trial, oxycontin  hs + tramadol  + gabap as  of 12/2017 f/u Dr. Carilyn.   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   Sick sinus syndrome (HCC)    PPM placed   Thrombocytopenia 2018   HSM on 2007 abd u/s---suspect some mild splenic sequestration chronically.   Unspecified  glaucoma(365.9)    Unspecified hereditary and idiopathic peripheral neuropathy approx age 29   bilat LE's, ? left arm, too.  Feet became progressively numb + left foot pain intermittently.  Pt may be trying a spinal stimulator (as of 05/2015)   Vaccine counseling 11/16/2021   VENOUS INSUFFICIENCY    Being followed by Dr. Harden as of 10/2016 for two R LL venous stasis ulcers/skin tears.  Healed as of 10/30/16 f/u with Dr. Harden.   Venous stasis dermatitis 12/27/2021   VENTRAL HERNIA     Past Surgical History:  Procedure Laterality Date   AMPUTATION Left 04/11/2013   Procedure: AMPUTATION DIGIT Left 3rd toe;  Surgeon: Jerona LULLA Harden, MD;  Location: [facility] OR;  Service: Orthopedics;  Laterality: Left;  Left 3rd toe amputation at [facility]   AMPUTATION Left 08/29/2019   Procedure: LEFT TRANSMETATARSAL AMPUTATION;  Surgeon: Harden Jerona LULLA, MD;  Location: [facility] OR;  Service: Orthopedics;  Laterality: Left;   BIOPSY  05/04/2020   Procedure: BIOPSY;  Surgeon: Charlanne Groom, MD;  Location: [name] ENDOSCOPY;  Service: Endoscopy;;   CARDIAC CATHETERIZATION  1997; 03/10/16   1997 Non-obstructive disease.  03/2016 BMS to RCA, with 25% pDiag dz, o/w normal cors per cath 03/07/16.  Cath 11/01/16: in stent restenosis, successful baloon angioplasty. 08/2020 branch vessel dz, cont medical therapy rec'd.   CARDIAC CATHETERIZATION  12/24/2012   mild < 20% LCx, prox 30% RCA; LVEF 55-65% , moderate pulmonary HTN, moderate AS   CARDIAC DEFIBRILLATOR PLACEMENT  11/07/2017   Claria MRI Quad CRT defibrillator   CARDIOVASCULAR STRESS TEST  05/11/16 ([facility])   2017 Myocardial perfusion imaging:  No ischemia; scar in apex, global hypokinesis, EF 36%.  06/15/20->mod primarily fixed inferolat wall defect mildly worse with stress c/w infarct/scar with mild peri-infarct ischemia, normal EF-->for cath per cards.   Carotid dopplers  03/09/2016   [facility]: no hemodynamically significant stenosis on either side.   CHOLECYSTECTOMY     COLONOSCOPY [name]  10/30/2012   Procedure: COLONOSCOPY;  Surgeon: Lupita FORBES Commander, MD;  Location: [facility] ENDOSCOPY;  Service: Endoscopy;  Laterality: [name];   CORONARY ANGIOPLASTY WITH STENT PLACEMENT  03/2016; 04/2017   2017-[facility]: BMS to RCA-pt was placed on Brilinta.  04/2017: DES to RCA.   DORSAL SLIT [name] 10/29/2019   for severe phimosis. Procedure: DORSAL SLIT;  Surgeon: Alvaro Hummer, MD;  Location: [facility] ORS;  Service: Urology;  Laterality: [name];  45 MINS   ESOPHAGEAL DILATION  05/04/2020   Procedure: ESOPHAGEAL DILATION;  Surgeon: Charlanne Groom, MD;  Location: [facility]  ENDOSCOPY;  Service: Endoscopy;;   ESOPHAGOGASTRODUODENOSCOPY (EGD) WITH PROPOFOL  [name] 05/04/2020   Procedure: ESOPHAGOGASTRODUODENOSCOPY (EGD) WITH PROPOFOL ;  Surgeon: Charlanne Groom, MD;  Location: [facility] ENDOSCOPY;  Service: Endoscopy;  Laterality: [name];   EYE SURGERY Bilateral cataract   HEMORRHOID SURGERY     INTRAOCULAR LENS INSERTION Bilateral    KNEE SURGERY Right    LEFT AND RIGHT HEART CATHETERIZATION WITH CORONARY ANGIOGRAM [name] 12/24/2012   Procedure: LEFT AND RIGHT HEART CATHETERIZATION WITH CORONARY ANGIOGRAM;  Surgeon: Peter M Jordan, MD;  Location: [facility] CATH LAB;  Service: Cardiovascular;  Laterality: [name];   LEG SURGERY Bilateral    lenghtening    PACEMAKER PLACEMENT  04/13/2016   2nd deg HB after TAVR, pt had DC MDT PPM placed.   SHOULDER ARTHROSCOPY  08/30/2011   Procedure: ARTHROSCOPY SHOULDER;  Surgeon: Jerona LULLA Sage, MD;  Location: [facility] OR;  Service: Orthopedics;  Laterality: Right;  Right Shoulder Arthroscopy, Debridement, and Decompression   SPINAL CORD STIMULATOR INSERTION [name] 09/10/2015   Procedure: LUMBAR SPINAL CORD STIMULATOR INSERTION;  Surgeon: Deward Fabian, MD;  Location: [facility] NEURO ORS;  Service: Neurosurgery;  Laterality: [name];   TEE WITHOUT CARDIOVERSION [name] 09/16/2019   Procedure: TRANSESOPHAGEAL ECHOCARDIOGRAM (TEE);  Surgeon: Raford Riggs, MD;  Location: [facility] ENDOSCOPY;  Service: Cardiovascular;  Laterality: [name];   TEE  WITHOUT CARDIOVERSION [name] 12/02/2019   +vegetation on AVR and pacer lead in RV.  EF 55-60%, normal wall motion.  Valves function normal.  Procedure: TRANSESOPHAGEAL ECHOCARDIOGRAM (TEE);  Surgeon: Barbaraann Darryle Ned, MD;  Location: [facility] ENDOSCOPY;  Service: Cardiovascular;  Laterality: [name];   TOE AMPUTATION Left    due to osteomyelitis.  R big toe surg due to osteoarth   TONSILLECTOMY     traeculectomy Left    eye   TRANSCATHETER AORTIC VALVE REPLACEMENT, TRANSFEMORAL  04/11/2016   TRANSESOPHAGEAL ECHOCARDIOGRAM  03/09/2016; 09/2019   [facility]: EF 55-60%, PFO seen with bi-directional shunting, no thrombus in appendage.  09/2019 ->no valvular vegetations. Small patent foramen ovale with predominantly left to right shunting across the interatrial septum.   TRANSESOPHAGEAL ECHOCARDIOGRAM (CATH LAB) [name] 04/15/2024   Procedure: TRANSESOPHAGEAL ECHOCARDIOGRAM;  Surgeon: Mona Vinie BROCKS, MD;  Location: [facility] INVASIVE CV LAB;  Service: Cardiovascular;  Laterality: [name];   TRANSTHORACIC ECHOCARDIOGRAM  01/2015; 01/2016; 05/18/16; 09/18/16, 05/2017, 08/2017   01/2015 No signif change in aortic stenosis (moderate).  01/2016 Severe LVH w/small LV cavity, EF 60-65%, grade I diast dysfxn.  05/2016 (s/p TAVR): EF 50-55%, grd I DD, biopros AV good.  08/2016--EF 50-55%, LV septal motion c/w conduction abnl, grd I DD,mild MS,bioprosth aortic valve well seated, w/trace AR. 05/2017 TTE EF 35%. 08/2017-EF 35%, mod diff hypokin LV, grd I DD, biopros AV good.    TRANSTHORACIC ECHOCARDIOGRAM  04/2018; 09/2019   04/2018: EF 40-45%, mod diffuse LV hypokin, grd I DD, bioprosth AV well seated, no AS or AR. 09/2019 EF 60-65%, grd I DD, valves fine, including bioprosth AV. 04/29/20 (tech diff) EF 55-60%, grd I DD, vegetation on MV.  07/2021 EF 55-60% (s/p upgrade to BiV PPM), AV well seated.   VITRECTOMY       Home Medications:  Prior to Admission medications  Medication Sig Start Date End Date Taking? Authorizing Provider  albuterol   (VENTOLIN  HFA) 108 (90 Base) MCG/ACT inhaler TAKE 2 PUFFS BY MOUTH EVERY 6 HOURS AS NEEDED FOR WHEEZE OR SHORTNESS OF BREATH 03/04/24  Yes McGowen, Aleene DEL, MD  allopurinol  (ZYLOPRIM ) 300 MG tablet Take 1 tablet (300 mg total) by mouth every  evening. Patient taking differently: Take 300 mg by mouth in the morning. 05/19/24  Yes McGowen, Aleene DEL, MD  amoxicillin  (AMOXIL ) 500 MG capsule Take 2 capsules (1,000 mg total) by mouth 2 (two) times daily. 03/17/24  Yes Fleeta Rothman, Jomarie SAILOR, MD  ASPIRIN  LOW DOSE 81 MG EC tablet Take 81 mg by mouth daily. 02/03/19  Yes [provider]  B Complex-C (B-COMPLEX WITH VITAMIN C ) tablet Take 1 tablet by mouth daily. Take 1 tablet daily, 250mg    Yes [provider]  clotrimazole -betamethasone  (LOTRISONE ) cream Apply 1 Application topically daily. 03/28/24 03/28/25 Yes [provider]  ezetimibe  (ZETIA ) 10 MG tablet Take 1 tablet (10 mg total) by mouth daily. 12/16/19  Yes McGowen, Aleene DEL, MD  ferrous sulfate 325 (65 FE) MG tablet Take 325 mg by mouth every other day.   Yes [provider]  FIBER PO Take 5 capsules by mouth in the morning.   Yes [provider]  finasteride  (PROSCAR ) 5 MG tablet Take 1 tablet (5 mg total) by mouth daily. 02/19/24  Yes McGowen, Aleene DEL, MD  fluticasone  (FLONASE ) 50 MCG/ACT nasal spray Place 2 sprays into both nostrils daily. Patient taking differently: Place 2 sprays into both nostrils daily as needed for allergies. 03/06/24  Yes McGowen, Aleene DEL, MD  fluticasone -salmeterol (WIXELA INHUB) 250-50 MCG/ACT AEPB Inhale 1 puff into the lungs 2 (two) times daily. in the morning and at bedtime. 12/12/23  Yes McGowen, Aleene DEL, MD  gabapentin  (NEURONTIN ) 800 MG tablet Take 1 tablet (800 mg total) by mouth 2 (two) times daily. Patient taking differently: Take 800 mg by mouth in the morning, at noon, in the evening, and at bedtime. 04/18/24  Yes Fausto Sor A, DO  loratadine (CLARITIN) 10 MG tablet Take  10 mg by mouth daily.   Yes [provider]  Multiple Vitamin (MULTIVITAMIN ADULT PO) Take 1 tablet by mouth in the morning and at bedtime.   Yes [provider]  niacin  (NIASPAN ) 1000 MG CR tablet TAKE 1 TABLET BY MOUTH TWICE A DAY 03/17/24  Yes McGowen, Aleene DEL, MD  nitroGLYCERIN  (NITROSTAT ) 0.4 MG SL tablet Place 0.4 mg under the tongue every 5 (five) minutes as needed for chest pain. 09/04/17  Yes [provider]  nystatin  (MYCOSTATIN /NYSTOP ) powder Apply topically 2 (two) times daily. 04/18/24  Yes Fausto Sor A, DO  Oxycodone  HCl 20 MG TABS 1 tab po bid prn severe pain Patient taking differently: Take 20 mg by mouth daily as needed (for pain). 03/21/24  Yes McGowen, Aleene DEL, MD  potassium chloride  SA (KLOR-CON  M) 20 MEQ tablet Take 0.5 tablets (10 mEq total) by mouth 2 (two) times daily. 05/19/24  Yes McGowen, Aleene DEL, MD  ranolazine  (RANEXA ) 500 MG 12 hr tablet Take 500 mg by mouth 2 (two) times daily. 03/28/24 06/26/24 Yes [provider]  rosuvastatin  (CRESTOR ) 20 MG tablet Take 20 mg by mouth daily. 10/09/22  Yes [provider]  VITAMIN D, CHOLECALCIFEROL, PO Take 1 tablet by mouth daily.    Yes [provider]  [Paused] amLODipine (NORVASC) 5 MG tablet Take 5 mg by mouth in the morning and at bedtime. Wait to take this until your doctor or other care provider tells you to start again. 03/28/24 06/26/24  [provider]  [Paused] apixaban  (ELIQUIS ) 5 MG TABS tablet Take 5 mg by mouth 2 (two) times daily. Wait to take this until your doctor or other care provider tells you to start again.  [provider]  [Paused] carvedilol (COREG) 3.125 MG tablet Take 3.125 mg by mouth 2 (two) times daily with a meal. Wait to take this until your doctor or other care provider tells you to start again. 03/28/24 06/26/24  [provider]  docusate sodium  (COLACE) 100 MG capsule Take 1 capsule (100 mg total) by mouth 2 (two)  times daily as needed for mild constipation. Patient not taking: Reported on 06/06/2024 04/18/24   Fausto Sor A, DO  [Paused] losartan  (COZAAR ) 25 MG tablet Take 25 mg by mouth daily. Wait to take this until your doctor or other care provider tells you to start again. 03/29/24 06/27/24  [provider]  naloxone  (NARCAN ) nasal spray 4 mg/0.1 mL One spray in nostril as needed for opoid overdose.  May repeat dose in 5 min if needed. Patient taking differently: Place 0.4 mg into the nose as needed (OD). 06/08/23   McGowen, Aleene DEL, MD  pantoprazole  (PROTONIX ) 40 MG tablet Take 1 tablet (40 mg total) by mouth daily. Patient not taking: Reported on 06/06/2024 09/01/22   McGowen, Philip H, MD  torsemide  (DEMADEX ) 20 MG tablet Take 1 tablet (20 mg total) by mouth daily as needed (weight gain or swelling). Patient not taking: Reported on 06/06/2024 04/18/24   Fausto Sor A, DO    Scheduled Meds:  acetaminophen   650 mg Oral TID   amoxicillin   1,000 mg Oral BID   arformoterol   15 mcg Nebulization Q12H   budesonide  (PULMICORT ) nebulizer solution  0.5 mg Nebulization BID   Chlorhexidine  Gluconate Cloth  6 each Topical Daily   collagenase   1 Application Topical Daily   enoxaparin  (LOVENOX ) injection  60 mg Subcutaneous Q24H   ezetimibe   10 mg Oral Daily   famotidine   20 mg Oral BID   feeding supplement  237 mL Oral TID BM   gabapentin   100 mg Oral TID   Gerhardt's butt cream   Topical BID   insulin  aspart  0-6 Units Subcutaneous TID AC & HS   lidocaine   1 patch Transdermal Q24H   lidocaine   1 Application Urethral Once   metoCLOPramide  (REGLAN ) injection  5 mg Intravenous Q8H   mirtazapine   15 mg Oral QHS   multivitamin with minerals  1 tablet Oral Daily   ranolazine   500 mg Oral BID   revefenacin   175 mcg Nebulization Daily   rosuvastatin   20 mg Oral Daily   sodium chloride  flush  10-40 mL Intracatheter Q12H   thiamine   100 mg Oral Daily   torsemide   20 mg Oral Daily   Continuous  Infusions:  PRN Meds: diclofenac  Sodium, docusate sodium , ipratropium-albuterol , ondansetron  (ZOFRAN ) IV, mouth rinse, oxyCODONE , oxyCODONE  **OR** [DISCONTINUED] oxyCODONE , polyethylene glycol, sodium chloride  flush  Allergies:   Allergies[1]  Social History:   Social History   Socioeconomic History   Marital status: Married    Spouse name: Not on file   Number of children: 0   Years of education: 20   Highest education level: Master's degree (e.g., MA, MS, MEng, MEd, MSW, MBA)  Occupational History   Occupation: public relations account executive    Comment: retired  Tobacco Use   Smoking status: Never    Passive exposure: Never   Smokeless tobacco: Never  Vaping Use   Vaping status: Never Used  Substance and Sexual Activity   Alcohol  use: No    Alcohol /week: 0.0 standard drinks of alcohol    Drug use: No    Types: Oxycodone    Sexual activity: Not Currently  Other  Topics Concern   Not on file  Social History Narrative   HSG, [name] - BS, [employer] - MS-engineering, 2 years on PhD - [employer] . Married - '65 - 66yrs/divorced; '76- 3 yrs/divorced; '92 . No children. 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    Lives with wife as of 2020. ACP/Living Will - Yes CPR; long-term Mechanical ventilation as long as he was able to cognate; ok for long term artificial nutrition. Precondition being able to cognate and not to have too much pain.    Social Drivers of Health   Tobacco Use: Low Risk (06/05/2024)   Patient History    Smoking Tobacco Use: Never    Smokeless Tobacco Use: Never    Passive Exposure: Never  Financial Resource Strain: Low Risk (09/23/2023)   Overall Financial Resource Strain (CARDIA)    Difficulty of Paying Living Expenses: Not very hard  Food Insecurity: No Food Insecurity (06/07/2024)   Epic    Worried About Programme Researcher, Broadcasting/film/video in the Last Year: Never true    Ran Out of Food in the Last Year: Never true  Transportation Needs: No Transportation Needs (06/07/2024)    Epic    Lack of Transportation (Medical): No    Lack of Transportation (Non-Medical): No  Physical Activity: Unknown (09/23/2023)   Exercise Vital Sign    Days of Exercise per Week: 0 days    Minutes of Exercise per Session: Not on file  Stress: No Stress Concern Present (05/01/2024)   Received from [facility] - Occupational Stress Questionnaire    Do you feel stress - tense, restless, nervous, or anxious, or unable to sleep at night because your mind is troubled all the time - these days?: Not at all  Social Connections: Moderately Isolated (06/07/2024)   Social Connection and Isolation Panel    Frequency of Communication with Friends and Family: Once a week    Frequency of Social Gatherings with Friends and Family: Never    Attends Religious Services: Never    Database Administrator or Organizations: No    Attends Engineer, Structural: 1 to 4 times per year    Marital Status: Married  Catering Manager Violence: Not At Risk (06/07/2024)   Epic    Fear of Current or Ex-Partner: No    Emotionally Abused: No    Physically Abused: No    Sexually Abused: No  Depression (PHQ2-9): Low Risk (03/31/2024)   Depression (PHQ2-9)    PHQ-2 Score: 0  Alcohol  Screen: Not on file  Housing: Low Risk (06/07/2024)   Epic    Unable to Pay for Housing in the Last Year: No    Number of Times Moved in the Last Year: 0    Homeless in the Last Year: No  Recent Concern: Housing - High Risk (05/01/2024)   Received from [facility]    In the last 12 months, was there a time when you were not able to pay the mortgage or rent on time?: Yes    Number of Times Moved in the Last Year: 0    At any time in the past 12 months, were you homeless or living in a shelter (including now)?: No  Utilities: Not At Risk (06/07/2024)   Epic    Threatened with loss of utilities: No  Health Literacy: Not on file    Family History:    Family History  Problem  Relation Age of  Onset   Hypertension Mother    Coronary artery disease Mother    Heart attack Mother    Neuropathy Mother    Pulmonary fibrosis Father        asbestosis     ROS:  Please see the history of present illness.   All other ROS reviewed and negative.     Physical Exam/Data: Vitals:   06/17/24 0332 06/17/24 0754 06/17/24 0800 06/17/24 1107  BP: (!) 128/51 (!) 106/49 (!) 116/50 (!) 108/55  Pulse: 91 92 90 89  Resp: 20 15 12 20   Temp: 98.4 F (36.9 C) 98.3 F (36.8 C)  98.3 F (36.8 C)  TempSrc: Oral Oral  Oral  SpO2: 96% 99% 100% 94%  Weight:      Height:        Intake/Output Summary (Last 24 hours) at 06/17/2024 1335 Last data filed at 06/17/2024 1111 Gross per 24 hour  Intake 242.56 ml  Output 2875 ml  Net -2632.44 ml      06/15/2024    5:00 AM 06/14/2024    2:45 AM 06/13/2024    3:00 AM  Last 3 Weights  Weight (lbs) 260 lb 9.3 oz 251 lb 1.7 oz 263 lb 14.3 oz  Weight (kg) 118.2 kg 113.9 kg 119.7 kg     Body mass index is 31.72 kg/m.  General:  Well nourished, well developed, in no acute distress.  Alert and orientated on room air HEENT: normal Neck: no JVD Vascular: No carotid bruits; Distal pulses 2+ bilaterally Cardiac:  normal S1, S2; RRR; no murmur. Lungs:  clear to auscultation bilaterally, no wheezing, rhonchi or rales  Abd: soft, nontender, no hepatomegaly  Ext: no edema Musculoskeletal:  No deformities. Skin: warm and dry  Neuro:  no focal abnormalities noted Psych:  Normal affect   EKG:  The EKG was personally reviewed and demonstrates:  a sensed V paced rhythm with a rate of 86. Telemetry:  Telemetry was personally reviewed and demonstrates: A sensed V paced rhythm with heart rates in the 80s and 90s  Relevant CV Studies: Prior TTE on 04/15/2024 IMPRESSIONS     1. Left ventricular ejection fraction, by estimation, is 65 to 70%. The  left ventricle has normal function. There is mild left ventricular  hypertrophy.   2. Right  ventricular systolic function is normal. The right ventricular  size is normal.   3. Calcified small mobile mass on the anterior leaflet, likely old  vegetation. The mitral valve is degenerative. Mild mitral valve  regurgitation.   4. The aortic valve has been repaired/replaced. There is a 29 mm Sapien  prosthetic (TAVR) valve present in the aortic position. Echo findings are  consistent with stenosis of the aortic prosthesis. EOA, by VTI measures  0.55 cm. Aortic valve mean  gradient measures 40.0 mmHg. Aortic valve Vmax measures 4.13 m/s. Peak  gradient 68.2 mmHg, DI 0.20.   5. 3D performed of the aortic valve and demonstrates 3D evaluation of the  aortic valve prosthesis demonstrates severe prosthetic aortic valve  stenosis.   6. Left atrial size was moderately dilated. No left atrial/left atrial  appendage thrombus was detected.    Laboratory Data: High Sensitivity Troponin:  No results for input(s): TROPONINIHS in the last 720 hours. No results for input(s): TRNPT in the last 720 hours.    Chemistry Recent Labs  Lab 06/15/24 0330 06/16/24 0626 06/17/24 0301  NA 139 137 139  K 3.4* 4.0 2.9*  CL 103 103 100  CO2 29  24 30  GLUCOSE 85 75 95  BUN 13 15 13   CREATININE 0.63 0.68 0.61  CALCIUM  7.6* 7.8* 7.7*  MG 1.4* 1.8 1.6*  GFRNONAA >60 >60 >60  ANIONGAP 7 10 9     Recent Labs  Lab 06/11/24 0355  PROT 4.4*  ALBUMIN 1.8*  AST 202*  ALT 196*  ALKPHOS 212*  BILITOT 1.1   Lipids No results for input(s): CHOL, TRIG, HDL, LABVLDL, LDLCALC, CHOLHDL in the last 168 hours.  Hematology Recent Labs  Lab 06/15/24 0330 06/16/24 0626 06/17/24 0301  WBC 5.7 6.2 5.3  RBC 2.82* 3.23* 2.96*  HGB 9.2* 10.2* 9.4*  HCT 27.8* 32.3* 28.6*  MCV 98.6 100.0 96.6  MCH 32.6 31.6 31.8  MCHC 33.1 31.6 32.9  RDW 15.4 15.3 15.4  PLT 106* 126* 137*   Thyroid  No results for input(s): TSH, FREET4 in the last 168 hours.  BNPNo results for input(s): BNP, PROBNP in  the last 168 hours.  DDimer No results for input(s): DDIMER in the last 168 hours.  Radiology/Studies:  DG Abd 1 View Result Date: 06/14/2024 EXAM: 1 VIEW XRAY OF THE ABDOMEN 06/14/2024 11:56:00 AM COMPARISON: 06/13/2024 CLINICAL HISTORY: Ileus (HCC) FINDINGS: LINES, TUBES AND DEVICES: Partially visualized spinal cord stimulator noted. Status post TAVR (Transcatheter Aortic Valve Replacement). BOWEL: Mild gaseous distension of the transverse colon, decreased from prior. SOFT TISSUES: Cholecystectomy clips noted. No abnormal calcifications. BONES: No acute fracture. IMPRESSION: 1. Mild gaseous distension of the transverse colon, decreased from prior. Electronically signed by: Pinkie Pebbles MD 06/14/2024 05:58 PM EST RP Workstation: [id number]     Assessment and Plan: Tim [name] is a 83 y.o. 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  Septic shock -resolved Bacteremia -resolved Metabolic encephalopathy -resolved History of [name] UTI The patient was hospitalized at [facility] for concerns of septic shock, bacteremia, and UTI.  There are currently plans to transfer the patient to an inpatient rehab. Management per primary   Atrial fibrillation Has a pounding sensation in the chest with atrial fibrillation. On 04/2024 Eliquis  was held due to hematuria.  Patient continues to have a Foley catheter in place and recently had an episode of hematuria.  Because of this we will continue to hold Eliquis .   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 TEE performed on 04/2024 showed normal LVEF of 65 to 70%, mild LVH, normal RV function, old vegetation on anterior leaflet of mitral valve, replaced aortic valve,and severe prosthetic aortic valve stenosis (VTI 0.55 cm).' Recommend outpatient follow-up with [facility]   Complete heart block s/p PPM on 04/2016 with BIV ICD 03/2018 Patient's wife expressed concern that he has had multiple hospitalizations and has not been able to make it to outpatient follow-up to get his battery and his ICD replaced Last interrogation on 05/2024 estimated the patient had about 3 months of battery life remaining. Recommend outpatient follow-up with [facility]   CAD s/p BMS to RCA on 03/2016, DES to RCA on 04/2017 Hyperlipidemia Had an NSTEMI on  03/2024 at [facility].  A cardiac catheterization was performed that showed a normal left main, 25% stenosis in the mid LAD, 95% stenosis in the ostial diagonals, normal LCx, and 60% stenosis in the mid RCA.  It was felt like the ostial disease in the diagonals was the cause of the NSTEMI.  The branch was too small to intervene and this was managed medically with aspirin , Eliquis , and statin.  Aspirin  is currently on hold given ongoing concerns for hematuria. Continue Zetia  10 mg daily. Continue Crestor  20 mg daily. Continue ranexa  500mg  BID.    Otherwise management per primary.     Risk Assessment/Risk Scores:       New York  Heart Association (NYHA) Functional Class NYHA Class III  CHA2DS2-VASc Score = 6   This indicates a 9.7% annual risk of stroke. The patient's score is based upon: CHF History: 1 HTN History: 1 Diabetes History: 1 Stroke History: 0 Vascular Disease History: 1 Age Score: 2 Gender Score: 0        For questions or updates, please contact [facility] Please consult [email or web address] for contact info under      Signed, Morse Clause, PA-C  06/17/2024 1:35 PM  Personally seen and examined. Agree with above.  83 year old with complex cardiac history as noted above. Hospitalized here with Klebsiella pneumonia bacterial sepsis as well as E. coli sepsis. Currently quite ill-appearing, weak in bed, wife at bedside.  Ventricular pacing noted from BiV ICD.  Most recent TEE here in October 25 showed EF of 70%.  Has a history of paroxysmal atrial fibrillation but has not been on anticoagulation since October 2025 secondary to hematuria.  Lets continue to hold given his thrombocytopenia and ongoing issues with hematuria and anemia.  His wife asked about ICD replacement (followed by Dr. Cornelia at [place].  LV paced 99.6% of the time.  Has a history of complete heart block.  Last interrogation on 05/10/2024 and care everywhere showed battery status-okay with 32-month battery longevity  In addition, his 29 mm SAPIEN TAVR valve in the aortic position was evaluated by TEE in October 2025 and demonstrated mean gradient of 40 mmHg peak V-max of 4.1 m/s and dimensionless index of 0.2 demonstrating severe prosthetic valve stenosis.  At that time, recommended that he follow-up with [facility] structural team.  It does not appear that the patient has made that appointment.  He has had several hospitalizations.  He was hospitalized at [facility] in late October for intestinal obstruction.  Unfortunately, he does not appear to be a candidate for valve in valve or structural team procedure at this point given his recent sepsis.  Of course he takes chronic antibiotics given his prior endocarditis.  Also, changing defibrillator battery may result in serious infection given his recent sepsis. He does have 3 months until ERI. Then after that 3 more months until battery depleted (6 more months total)  Discussed at length with he and his wife.   As was stated previously, our recommendation would be to heal over the next few weeks and  to be sure he is heading in the right direction prior to consideration of these procedures.  His EP doctor, Dr. Cornelia, should be included in these discussions.  He has been monitoring his defibrillator.  Obviously, we can always ask our EP team for guidance if necessary but it would be preferential for him to heal as best as possible, then have this BiV ICD replaced at [facility] by Dr. Cornelia.  Please reach out if  any further assistance is necessary.  Tim Parchment, MD      [1]  Allergies Allergen Reactions   Brimonidine Tartrate Shortness Of Breath    Alphagan-Shortness of breath   Brinzolamide Shortness Of Breath    AZOPT- Shortness of breath   Latanoprost Shortness Of Breath    XALATAN- Shortness of breath   Nucynta  [Tapentadol ] Shortness Of Breath   Sulfa Antibiotics Palpitations   Timolol Maleate Shortness Of Breath and Other (See Comments)    TIMOPTIC- Aggravated asthma   Diltiazem Swelling     leg swelling   Rofecoxib Swelling     VIOXX- leg swelling   Vancomycin  Rash    Tolerates oral vanc   Codeine Other (See Comments)    Childhood reaction   Tamsulosin Other (See Comments)    Dizziness    Celecoxib Other (See Comments)    CELLBREX-confusion   Colchicine Diarrhea    diarrhea   Tape Rash

## 2024-06-17 NOTE — Progress Notes (Signed)
 Calorie Count Note  48 hour calorie count ordered.  Diet: GI soft Supplements:  Magic cup TID with meals, each supplement provides 290 kcal and 9 grams of protein Boost Breeze po TID, each supplement provides 250 kcal and 9 grams of protein  RD working remotely. Discussed calorie count with RN.  Per tickets retrieved, pt consumed 1 magic cup for lunch and about half the chicken from dinner but did not receive food items that he likes.   He was documented to have been given 2 Boost Breeze but flowsheet reflects 1 consumed.   Overall, po intake yesterday per report reflects about 1156 kcal and 55g protein consumed. This meets about 48% of minimum estimated kcals and 39% minimum estimated protein needs.   Discussed with MD and RN. Patient at times has really good oral intake (60-100% of a meal) though throughout admission many meals have been 0-30%. Remeron  added yesterday which could potentially work as an appetite stimulant however these often can take several weeks to aid in appetite changes. Will adjust nutrition supplements to more nutritionally dense supplement and add snacks in between meals to continue to augment intake.   NUTRITION DIAGNOSIS:  Moderate Malnutrition related to chronic illness (CAD, AS, endocarditis, HF, COPD) as evidenced by moderate fat depletion, moderate muscle depletion, edema; ongoing.   GOAL:  Patient will meet greater than or equal to 90% of their needs; progressing.   INTERVENTION:  Discontinue calorie count Continue MVI with minerals daily Continue Magic cup TID with meals, each supplement provides 290 kcal and 9 grams of protein Change supplement to Ensure Plus High Protein po BID, each supplement provides 350 kcal and 20 grams of protein Add snacks TID between meals  Tim [name], RDN, LDN Clinical Nutrition See AMiON for contact information.

## 2024-06-17 NOTE — Progress Notes (Signed)
 Daily Progress Note   Patient Name: Tim [name]       Date: 06/17/2024 DOB: 1940/09/26  Age: 83 y.o. MRN#: [id number] Attending Physician: Christobal Guadalajara, MD Primary Care Physician: [name], MD Admit Date: 06/05/2024 Length of Stay: 11 days  Reason for Consultation/Follow-up: Establishing goals of care  Subjective:  Subjective: Chart reviewed including recent notes from hospitalist, [facility], psychiatry and palliative care notes from the weekend.  Note that he has been having improvement in paranoia and confusion.  Labs reviewed and notable for sodium 137, creatinine 0.68, WBC 6.2, hemoglobin 10.2.  I saw and examined [name] today.  His wife was at the bedside as well.  Patient was lying in bed and eating breakfast.  He seems more interactive and less anxious/flat today.  Reports he is currently feeling well with no pain or shortness of breath.  Both [name] and his wife expressed concern about plan to discharge to skilled facility.  His wife reports that he had a horrible experience the last time he went to skilled facility.  He was at [facility] and she had multiple examples of why she feels that he did not receive adequate care while there.  States that the people that they dealt with were nice, however, they were severely understaffed for the amount of care that people need.  We had a long discussion regarding why skilled facility would be the best option to continue his care when he leaves the hospital.  Discussed concerns that she had expressed prior about her ability to care for him at home in his current state.  We talked about the fact that even with additional hired help, his care needs remain very high at this time.  She agrees that he will need to work to regain some strength and functional status prior to any conversation about him returning home.  He acknowledges this fact as well.  She reports that she is supposed to be receiving information on bed offers  later today to review.  We discussed strategies for her to feel better about choice of facility. Discussed that TOC could provide information on offers and recommended that she speak to friends about prior experiences, review medicare start ratings, and choose to visit 1-2 personally after she had narrowed down choices.  I showed her how to find Medicare star ratings site on her ipad.  We talked about her being overwhelmed and encouraged her to take time for herself and self-care.  Palliative to continue to follow.  Review of Systems Denies complaints reports appetite and mood better today Objective:   Vital Signs:  BP (!) 116/50   Pulse 90   Temp 98.3 F (36.8 C) (Oral)   Resp 12   Ht 6' 4 (1.93 m)   Wt 118.2 kg   SpO2 100%   BMI 31.72 kg/m   Physical Exam: General: NAD, alert, confusion resolved today Eyes:  anicteric sclera HENT: normocephalic, atraumatic, moist mucous membranes Cardiovascular: RRR Respiratory: no increased work of breathing noted, not in respiratory distress Abdomen: not distended Neuro: A&O, following commands easily Psych: flat affect, tangential with circular thought patterns  Imaging: @IMAGES @  I personally reviewed recent imaging.   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  Recommendations/Plan: # Complex medical  decision making/goals of care:  -  Continue current care.  Plan for SNF for rehab when medically ready.    - Long discussion today regarding prior experience at SNF and wife's concerns regarding this.  We talked about the fact that she cannot meet his care needs at home and skilled facility would be the safest option at discharge and would be in line with his  overall goal of regaining enough strength to return home.  We discussed her concerns with choosing facility and I recommended she talk to friends and family, review Medicare star ratings, and visit facilities to see which would be the best fit for Mr. Calvey as we are getting closer to his discharge.  -  Code Status: Limited: Do not attempt resuscitation (DNR) -DNR-LIMITED -Do Not Intubate/DNI   Prognosis: Unable to determine  # Discharge Planning: SNF with palliative care to follow  Discussed with: patient, wife, bedside care team Thank you for allowing the palliative care team to participate in the care Lamar JINNY Rolly.  Amaryllis Meissner, MD Palliative Care Provider PMT # [phone]  If patient remains symptomatic despite maximum doses, please call PMT at [phone] between 0700 and 1900. Outside of these hours, please call attending, as PMT does not have night coverage.   I personally spent a total of 55 minutes in the care of the patient today including preparing to see the patient, getting/reviewing separately obtained history, performing a medically appropriate exam/evaluation, counseling and educating, referring and communicating with other health care professionals, and documenting clinical information in the EHR.

## 2024-06-17 NOTE — Telephone Encounter (Signed)
 CMA verbally asked provider if PCP gets removed, confirmed only if pt goes on hospice.

## 2024-06-17 NOTE — Telephone Encounter (Signed)
 Pt is on palliative care; saw palliative provider last on 06/06/24. Does PCP get removed?

## 2024-06-18 DIAGNOSIS — A419 Sepsis, unspecified organism: Secondary | ICD-10-CM | POA: Diagnosis not present

## 2024-06-18 DIAGNOSIS — R6521 Severe sepsis with septic shock: Secondary | ICD-10-CM | POA: Diagnosis not present

## 2024-06-18 LAB — GLUCOSE, CAPILLARY
Glucose-Capillary: 122 mg/dL — ABNORMAL HIGH (ref 70–99)
Glucose-Capillary: 123 mg/dL — ABNORMAL HIGH (ref 70–99)
Glucose-Capillary: 138 mg/dL — ABNORMAL HIGH (ref 70–99)
Glucose-Capillary: 126 mg/dL — ABNORMAL HIGH (ref 70–99)

## 2024-06-18 LAB — BASIC METABOLIC PANEL WITH GFR
Anion gap: 8 (ref 5–15)
BUN: 16 mg/dL (ref 8–23)
CO2: 30 mmol/L (ref 22–32)
Calcium: 8.5 mg/dL — ABNORMAL LOW (ref 8.9–10.3)
Chloride: 102 mmol/L (ref 98–111)
Creatinine, Ser: 0.58 mg/dL — ABNORMAL LOW (ref 0.61–1.24)
GFR, Estimated: >60 mL/min (ref >60)
Glucose, Bld: 126 mg/dL — ABNORMAL HIGH (ref 70–99)
Potassium: 3.7 mmol/L (ref 3.5–5.1)
Sodium: 140 mmol/L (ref 135–145)

## 2024-06-18 LAB — CBC
HCT: 30.8 % — ABNORMAL LOW (ref 39.0–52.0)
Hemoglobin: 10 g/dL — ABNORMAL LOW (ref 13.0–17.0)
MCH: 31.7 pg (ref 26.0–34.0)
MCHC: 32.5 g/dL (ref 30.0–36.0)
MCV: 97.8 fL (ref 80.0–100.0)
Platelets: 165 K/uL (ref 150–400)
RBC: 3.15 MIL/uL — ABNORMAL LOW (ref 4.22–5.81)
RDW: 15.6 % — ABNORMAL HIGH (ref 11.5–15.5)
WBC: 6 K/uL (ref 4.0–10.5)
nRBC: 0 % (ref 0.0–0.2)

## 2024-06-18 LAB — MAGNESIUM: Magnesium: 1.8 mg/dL (ref 1.7–2.4)

## 2024-06-18 MED ORDER — DIPHENOXYLATE-ATROPINE 2.5-0.025 MG PO TABS
1.0000 | ORAL_TABLET | Freq: Once | ORAL | Status: AC
  Administered 2024-06-18: 12:00:00 1 via ORAL
  Filled 2024-06-18: qty 1

## 2024-06-18 MED ORDER — MAGNESIUM SULFATE 2 GM/50ML IV SOLN
2.0000 g | Freq: Once | INTRAVENOUS | Status: AC
  Administered 2024-06-18: 12:00:00 2 g via INTRAVENOUS
  Filled 2024-06-18: qty 50

## 2024-06-18 MED ORDER — POTASSIUM CHLORIDE CRYS ER 20 MEQ PO TBCR
40.0000 meq | EXTENDED_RELEASE_TABLET | Freq: Once | ORAL | Status: AC
  Administered 2024-06-18: 12:00:00 40 meq via ORAL
  Filled 2024-06-18: qty 2

## 2024-06-18 MED ADMIN — THERA M PLUS PO TABS: 1 | ORAL | @ 11:00:00 | NDC 1650058694

## 2024-06-18 NOTE — TOC Progression Note (Addendum)
 Transition of Care (TOC) - Progression Note  Rayfield Gobble RN, BSN Inpatient Care Management Unit 4E- RN Case Manager See Treatment Team for direct phone #   Patient Details  Name: Tim [name] MRN: [id number] Date of Birth: 13-Sep-1940  Transition of Care [facility]) CM/SW Contact  Gobble, Rayfield Hurst, RN Phone Number: 06/18/2024, 1:42 PM  Clinical Narrative:    consult received for potential IP Hospice- CM spoke with wife at bedside confirmed Hospice choice- wife states she is agreeable for referral to be sent to [facility] for them to review for IP Hospice eligibility.   Call made to [facility] liaison Jeryl) - referral given for IP Hospice - they will review for eligibility- per liaison they do not have any beds available today should pt meet eligibility. Liaison will follow up with this CM.    Expected Discharge Plan: Skilled Nursing Facility Barriers to Discharge: Awaiting State Approval (PASRR), Continued Medical Work up               Expected Discharge Plan and Services In-house Referral: Clinical Social Work Discharge Planning Services: CM Consult Post Acute Care Choice: Home Health, Resumption of Svcs/PTA Provider Living arrangements for the past 2 months: Single Family Home                           HH Arranged: RN, PT HH Agency: [facility] Date HH Agency Contacted: 06/10/24 Time HH Agency Contacted: 1455 Representative spoke with at [facility] [place] Agency: [facility]   Social Drivers of Health (SDOH) Interventions SDOH Screenings   Food Insecurity: No Food Insecurity (06/07/2024)  Housing: Low Risk (06/07/2024)  Recent Concern: Housing - High Risk (05/01/2024)   Received from [facility]  Transportation Needs: No Transportation Needs (06/07/2024)  Utilities: Not At Risk (06/07/2024)  Depression (PHQ2-9): Low Risk (03/31/2024)  Financial Resource Strain: Low Risk (09/23/2023)  Physical Activity: Unknown (09/23/2023)   Social Connections: Moderately Isolated (06/07/2024)  Stress: No Stress Concern Present (05/01/2024)   Received from [facility]  Tobacco Use: Low Risk (06/05/2024)    Readmission Risk Interventions    04/15/2024    3:20 PM 04/14/2024    4:33 PM  Readmission Risk Prevention Plan  Transportation Screening Complete Complete  PCP or Specialist Appt within 5-7 Days Complete Complete  Home Care Screening Complete Complete  Medication Review (RN CM) Complete Referral to Pharmacy

## 2024-06-18 NOTE — TOC Progression Note (Signed)
 Transition of Care [facility]) - Progression Note    Patient Details  Name: Tim [name] MRN: [id number] Date of Birth: 1940/11/17  Transition of Care [facility]) CM/SW Contact  Montie LOISE Louder, [place] Phone Number: 06/18/2024, 11:17 AM  Clinical Narrative:     PSARR remains pending   Expected Discharge Plan: Skilled Nursing Facility Barriers to Discharge: Awaiting State Approval THE[place]), Continued Medical Work up               Expected Discharge Plan and Services In-house Referral: Clinical Social Work Discharge Planning Services: CM Consult Post Acute Care Choice: Home Health, Resumption of Svcs/PTA Provider Living arrangements for the past 2 months: Single Family Home                           HH Arranged: RN, PT HH Agency: [facility] Date HH Agency Contacted: 06/10/24 Time HH Agency Contacted: 1455 Representative spoke with at [facility] Agency: [facility]   Social Drivers of Health (SDOH) Interventions SDOH Screenings   Food Insecurity: No Food Insecurity (06/07/2024)  Housing: Low Risk (06/07/2024)  Recent Concern: Housing - High Risk (05/01/2024)   Received from [facility]  Transportation Needs: No Transportation Needs (06/07/2024)  Utilities: Not At Risk (06/07/2024)  Depression (PHQ2-9): Low Risk (03/31/2024)  Financial Resource Strain: Low Risk (09/23/2023)  Physical Activity: Unknown (09/23/2023)  Social Connections: Moderately Isolated (06/07/2024)  Stress: No Stress Concern Present (05/01/2024)   Received from [facility]  Tobacco Use: Low Risk (06/05/2024)    Readmission Risk Interventions    04/15/2024    3:20 PM 04/14/2024    4:33 PM  Readmission Risk Prevention Plan  Transportation Screening Complete Complete  PCP or Specialist Appt within 5-7 Days Complete Complete  Home Care Screening Complete Complete  Medication Review (RN CM) Complete Referral to Pharmacy

## 2024-06-18 NOTE — Progress Notes (Signed)
 Physical Therapy Treatment Patient Details Name: Tim [name] MRN: [id number] DOB: 08-26-1940 Today's Date: 06/18/2024   History of Present Illness The pt is an 83 yo male admitted 12/4 with fever 103.7, found to have ESBL klebsiella ecoli bacteremia and UTI. Hospital stay complicated by hypotension requiring pressors and ICU, and acute metabolic encephalopathy. PMH includes: Cdiff 10/27-11/5 at [facility], HTN TAVR, hx of endocarditis, mitral valve lesion, AKI, CAD, PAF, CAD s/p PCI, complete heart block s/p PPM, HTN, a fib, DM, OSA, depression, AICD, glaucoma    PT Comments  Pt resting in bed on arrival and agreeable to session. Pt eager to come to sitting EOB and requiring mod A to manage Les and to elevate trunk to sitting. Pt able to maintain sitting up EOB ~15 mins before fatigue. Pt requiring max A to scoot along EOB to [facility] with explicit cues throughout for sequencing and hand placement. Pt returning to supine with max A. Pt tearful at end of session, therapeutic listening and encouragement provided. Pt continues to benefit from skilled PT services to progress toward functional mobility goals.     If plan is discharge home, recommend the following: Two people to help with walking and/or transfers;Two people to help with bathing/dressing/bathroom;Assistance with cooking/housework;Assistance with feeding;Direct supervision/assist for medications management;Direct supervision/assist for financial management;Assist for transportation;Help with stairs or ramp for entrance;Supervision due to cognitive status   Can travel by private vehicle     No  Equipment Recommendations  Wheelchair (measurements PT);Wheelchair cushion (measurements PT);Hospital bed;Hoyer lift    Recommendations for Other Services       Precautions / Restrictions Precautions Precautions: Fall Recall of Precautions/Restrictions: Impaired Restrictions Weight Bearing Restrictions Per Provider Order: No     Mobility  Bed  Mobility Overal bed mobility: Needs Assistance Bed Mobility: Supine to Sit, Sit to Supine     Supine to sit: Mod assist, HOB elevated, Used rails Sit to supine: Max assist, HOB elevated, Used rails   General bed mobility comments: ceus for step by step sequenicng to EOB, mod A to bring LEs fully off and to elevate trunk, max A for all aspects to return to supine    Transfers Overall transfer level: Needs assistance Equipment used: None Transfers: Bed to chair/wheelchair/BSC            Lateral/Scoot Transfers: Max assist General transfer comment: pt laterally scooting x3 along EOB with max A  to complete and MAX cues each scoot for refocusing on task    Ambulation/Gait               General Gait Details: unable at this time   Stairs             Wheelchair Mobility     Tilt Bed    Modified Rankin (Stroke Patients Only)       Balance Overall balance assessment: Needs assistance Sitting-balance support: Feet supported Sitting balance-Leahy Scale: Fair Sitting balance - Comments: grossly CGA at EOB                                    Communication Communication Communication: Impaired Factors Affecting Communication: Hearing impaired;Reduced clarity of speech  Cognition Arousal: Alert Behavior During Therapy: Flat affect   PT - Cognitive impairments: Awareness, Attention, Initiation, Sequencing, Problem solving, Safety/Judgement                       PT -  Cognition Comments: pt asking repeating questions throughout session Following commands: Impaired Following commands impaired: Follows one step commands inconsistently, Only follows one step commands consistently    Cueing Cueing Techniques: Verbal cues, Tactile cues  Exercises      General Comments General comments (skin integrity, edema, etc.): pt tearful at end of session      Pertinent Vitals/Pain Pain Assessment Pain Assessment: Faces Faces Pain Scale: Hurts  little more Pain Location: Back Pain Descriptors / Indicators: Discomfort, Grimacing, Crying, Moaning, Guarding Pain Intervention(s): Monitored during session, Limited activity within patient's tolerance    Home Living                          Prior Function            PT Goals (current goals can now be found in the care plan section) Acute Rehab PT Goals Patient Stated Goal: to get better Progress towards PT goals: Progressing toward goals    Frequency    Min 2X/week      PT Plan      Co-evaluation              AM-PAC PT 6 Clicks Mobility   Outcome Measure  Help needed turning from your back to your side while in a flat bed without using bedrails?: Total Help needed moving from lying on your back to sitting on the side of a flat bed without using bedrails?: Total Help needed moving to and from a bed to a chair (including a wheelchair)?: Total Help needed standing up from a chair using your arms (e.g., wheelchair or bedside chair)?: Total Help needed to walk in hospital room?: Total Help needed climbing 3-5 steps with a railing? : Total 6 Click Score: 6    End of Session   Activity Tolerance: No increased pain;Patient limited by fatigue;Patient limited by lethargy Patient left: in bed;with call bell/phone within reach;with family/visitor present Nurse Communication: Mobility status;Other (comment) (pt IV alarming that infusion complete) PT Visit Diagnosis: Muscle weakness (generalized) (M62.81);Difficulty in walking, not elsewhere classified (R26.2)     Time: 8472-8450 PT Time Calculation (min) (ACUTE ONLY): 22 min  Charges:    $Therapeutic Activity: 8-22 mins PT General Charges $$ ACUTE PT VISIT: 1 Visit                     Zoila Ditullio R. PTA Acute Rehabilitation Services Office: [phone]   Therisa CHRISTELLA Boor 06/18/2024, 4:03 PM

## 2024-06-18 NOTE — Progress Notes (Signed)
 PROGRESS NOTE Tim [name]  FMW:[id number] DOB: [birth year] DOA: 06/05/2024 PCP: [name], MD  Brief Narrative/Hospital Course: Tim [name] is a 83 y.o. 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 Patient has history of enterococcal infection of the AICD lead that is being treated with chronic amoxicillin .   Recent admission at [facility] on 10/9-10/17:mixed hypovolemic/septic shock. At [facility] on 10/29-11/1:[name] treated w/ vanc. Wife states he has not had diarrhea symptoms and off po vanc. Did have diarrhea on 12/3 and so did wife. Wife also states he has poor po intake.  On 12/4 overnight patient had high fever. EMS called found temp of 103.7. In [name] 99.3 F and wbc 6.7. LA 5. Chronic foley in place w/ hematuria which was replaced. UA inconclusive. INR 3.7. CXR unremarkable. Cultures obtained and started on broad spectrum abx. Despite fluids remains hypotensive started on levoPHED  12/5 admit to [facility] 12/6 spiked fever with Tmax of 100.7, blood culture is growing ESBL gram-negative rods with E. coli/Klebsiella.  Vasopressor requirement improving, Levophed  at 13, vasopressin  at 0.03 unit.  Foley catheter was exchanged by urology, no more hematuria 12/8 norepinephrine  requirements coming down.  Platelet count stabilized.  LFTs climbing compared to last 72 hours 12/10: Transferred to [facility] service 12-11/12 having episodes of vomiting x-ray shows ileus versus gastroparesis> has resolved and tolerating p.o. diet  Subjective: Seen and examined Wife and patient tearful this morning Patient voiced that he would like to approach with palliative option at this point after he had conversation with cardiology yesterday Overnight on room air, vitals fairly stable and  afebrile -labs showed overall stable CBC mag improved BMP stable   Assessment and plan:  Goals of care Deconditioning/debility Recurrent hospitalization Poor oral intake question rehab potential, continue PT OT aggressively.  Patient is DNR limited. he has had very poor intake very deconditioned with complex medical comorbidities Created space and opportunity for patient to explore thoughts, feelings and fears regarding current medical situation-and also provided therapeutic listening -wife is very overwhelmed with his medical condition and his overall decline-and also anxious about his rehab. Again he is at high risk for readmission decompensation overall prognosis guarded.  He had a long conversation with cardiology yesterday-team she is deciding for palliative/hospice care-await for palliative to see him today.  Patient and wife very tearful-understand that he had complex medical comorbidities has been in and out of hospital with multiple serious critical illness. Continue to augment diet, continue Remeron .  Palliative care to follow.    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 Blood culture12/4-5: E. coli and Klebsiella - repeat blood culture 12/9 NGTD  Urine culture- E. coli positive and hematuria Foley changed by urology 12/11 [name] Significantly leukocytosis 56k - which is resolved, vss and afebrile.  Completed meropenem   and now on home Amoxicillin  12/15>>  and on oral vancomycin  for [name] prophylaxis- d/c 12/17 Needs outpatient follow-up with Dr. Renda regarding his urological issues-I have reached out to Dr. Renda Consider voiding trial as he is more  stable and is able to ambulate. long-term  would consider if he is a candidate for suprapubic catheter.   Aortic stenosis status post TAVR. Last TEE in October 2025 showed ejection fraction 65 to 70%  Anasarca: Continue home torsemide .  Having good diuresis monitor electrolytes and replace.  He does not appear to be a candidate for valve or structural team procedure at this point given his recent sepsis.Net IO Since Admission: -7,545.34 mL [06/18/24 0840] .  Monitoring his weight.  VomitingX2 on 12/12 Possible ileus versus gastroparesis on imaging: Repeat x-ray improved, initially on Reglan  and discontinued.  Tolerating p.o., having bowel movement.  Continue to augment nutritional status.  Remeron   Hypokalemia Hypomagnesemia: Resolved  Paroxysmal A-fib with RVR: H/o CHB, has PPM History of hypertension-his Coreg amlodipine losartan  has been on hold since October: Rate currently controlled.Holding home Coreg and aspirin  in the setting of thrombocytopenia.  His Eliquis  has been on hold since October ( he had gross hematuira and eliquis  was held that time)- This admit home RN exchanged Foley and he had gross hematuria after that and admitted to [facility] with septic shock- 12/15 Discussed w/ Dr Renda from uro-could consider Eliquis  trial, seen by cardiology 12/16 at this time recommended to hold Eliquis  given his recurrent hematuria, still has Foley in place  He has been tolerating DVT prophylaxis without issues for 2 days now Needs PPM replacement Feb 2026-patient reports she has not been able to get back to her cardiologist> seen by cardiology team here, he will need to follow-up with Dr. Cornelia at [facility].  Thrombocytopenia: likely 2/2 sepsis and antibiotics, resolved   Anemia of chronic illness Stable.  Monitor and transfuse for Hb <7   Hyperglycemia Stable, cont SSI PRN  Acute metabolic encephalopathy: Suspecting encephalopathy and delirium in the setting of sepsis and ICU  admission. Mental status fairly stable but he is very forgetful-.  Decondition. Continue delirium precaution frequent reorientation PT OT and mobilize   Chronic back pain on chronic opiates: Patient reports no surgeon could fix his back, PTA on oxycodone  20 mg prn, gabapentin  800.  Continue pain meds and low-dose gabapentin  as tolerated  H/o C diff Continue suppressive vancomycin  while on meropenem .No diarrhea but constipation at times   Elevated transaminases: Likely in the setting of shock, resolving.     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  Now on Remeron  or Cymbalta  due to vomiting, also stopping Abilify  can resume if has paranoid thoughts again   ? Trigeminy in monitor: Has paced rhythm, asymptomatic monitor electrolytes continue telemetry.  Continue to replace electrolytes  Class I Obesity w/ Body mass index is 31.72 kg/m.: Will benefit with PCP follow-up, weight loss,healthy lifestyle and outpatient sleep eval if not done.  Left upper extremity single-lumen  midline 12/9 Foley in place    Mobility: PT Orders: Active PT Follow up Rec: Skilled Nursing-Short Term Rehab (<3 Hours/Day)06/16/2024 1400   DVT prophylaxis: SCDs Start: 06/06/24 0316 lovenox  Code Status:   Code Status: Limited: Do not attempt resuscitation (DNR) -DNR-LIMITED -Do Not Intubate/DNI  Family Communication: plan of care discussed with patient/wife at bedside updated Patient status is: Remains hospitalized because of severity of illness Level of care: Progressive   Dispo: The patient is from: HOME            Anticipated disposition: tbd  Objective: Vitals last 24 hrs: Vitals:   06/18/24 0000 06/18/24 0200 06/18/24 0337 06/18/24 0819  BP: 123/68 127/77  127/64  Pulse:   94 97  Resp: ROLLEN)  23 (!) 23 16 20   Temp:   98.6 F (37 C) (!) 97.4 F (36.3 C)  TempSrc:   Oral Oral  SpO2:   98% 100%  Weight:       Height:       Physical Examination: General exam: AOO,3, patient weak frail HEENT:Oral mucosa moist, Ear/Nose WNL grossly Respiratory system: clear to auscultation diminished at the bases, no use of accessory muscle Cardiovascular system: S1 & S2 +, No JVD. Gastrointestinal system: Abdomen soft, obese with NT BS+ Nervous System: Alert, awake, moving all extremities,and following commands. Extremities: extremities warm, leg edema trace, left TMA old healed. Skin: Warm, no rashes MSK: Normal muscle bulk,tone, power   Medications reviewed:  Scheduled Meds:  acetaminophen   650 mg Oral TID   amoxicillin   1,000 mg Oral BID   arformoterol   15 mcg Nebulization Q12H   budesonide  (PULMICORT ) nebulizer solution  0.5 mg Nebulization BID   Chlorhexidine  Gluconate Cloth  6 each Topical Daily   collagenase   1 Application Topical Daily   enoxaparin  (LOVENOX ) injection  60 mg Subcutaneous Q24H   ezetimibe   10 mg Oral Daily   famotidine   20 mg Oral BID   feeding supplement  237 mL Oral TID BM   gabapentin   100 mg Oral TID   Gerhardt's butt cream   Topical BID   insulin  aspart  0-6 Units Subcutaneous TID AC & HS   lidocaine   1 patch Transdermal Q24H   lidocaine   1 Application Urethral Once   mirtazapine   15 mg Oral QHS   multivitamin with minerals  1 tablet Oral Daily   ranolazine   500 mg Oral BID   revefenacin   175 mcg Nebulization Daily   rosuvastatin   20 mg Oral Daily   sodium chloride  flush  10-40 mL Intracatheter Q12H   thiamine   100 mg Oral Daily   torsemide   20 mg Oral Daily   Continuous Infusions:    Diet: Diet Order             DIET SOFT Room service appropriate? Yes with Assist; Fluid consistency: Thin  Diet effective now                 Sick patient will resume Data Reviewed: I have personally reviewed following labs and imaging studies ( see epic result tab) CBC: Recent Labs  Lab 06/14/24 0249 06/15/24 0330 06/16/24 0626 06/17/24 0301 06/18/24 0315  WBC 9.1  5.7 6.2 5.3 6.0  HGB 9.8* 9.2* 10.2* 9.4* 10.0*  HCT 29.6* 27.8* 32.3* 28.6* 30.8*  MCV 98.0 98.6 100.0 96.6 97.8  PLT 123* 106* 126* 137* 165   CMP: Recent Labs  Lab 06/14/24 0249 06/15/24 0330 06/16/24 0626 06/17/24 0301 06/18/24 0315  NA 141 139 137 139 140  K 3.3* 3.4* 4.0 2.9* 3.7  CL 106 103 103 100 102  CO2 28 29 24 30 30   GLUCOSE 106* 85 75 95 126*  BUN 14 13 15 13 16   CREATININE 0.55* 0.63 0.68 0.61 0.58*  CALCIUM  8.0* 7.6* 7.8* 7.7* 8.5*  MG 1.4* 1.4* 1.8 1.6* 1.8   GFR: Estimated Creatinine Clearance: 96.6 mL/min (A) (by C-G formula based on SCr of 0.58 mg/dL (L)). No results for input(s): AST, ALT, ALKPHOS, BILITOT, PROT, ALBUMIN in the last 168 hours.  No results for input(s): LIPASE, AMYLASE in the last 168 hours.  No results for input(s): AMMONIA in the last 168 hours.  Coagulation Profile:  No results for input(s): INR, PROTIME in the last 168  hours.  Unresulted Labs (From admission, onward)     Start     Ordered   06/07/24 0500  Magnesium   Daily,   R     Question:  Specimen collection method  Answer:  Lab=Lab collect   06/06/24 1126           Antimicrobials/Microbiology: Anti-infectives (From admission, onward)    Start     Dose/Rate Route Frequency Ordered Stop   06/16/24 1000  amoxicillin  (AMOXIL ) capsule 1,000 mg        1,000 mg Oral 2 times daily 06/09/24 1406     06/10/24 1000  vancomycin  (VANCOCIN ) capsule 125 mg        125 mg Oral Daily 06/09/24 1406 06/17/24 1124   06/09/24 1600  meropenem  (MERREM ) 1 g in sodium chloride  0.9 % 100 mL IVPB        1 g 200 mL/hr over 30 Minutes Intravenous Every 8 hours 06/09/24 1406 06/16/24 0759   06/07/24 1000  vancomycin  (VANCOCIN ) capsule 125 mg  Status:  Discontinued        125 mg Oral Daily 06/06/24 1432 06/09/24 1406   06/06/24 1000  linezolid  (ZYVOX ) IVPB 600 mg  Status:  Discontinued        600 mg 300 mL/hr over 60 Minutes Intravenous Every 12 hours 06/06/24 0704 06/06/24  1432   06/06/24 0800  ceFEPIme  (MAXIPIME ) 2 g in sodium chloride  0.9 % 100 mL IVPB  Status:  Discontinued        2 g 200 mL/hr over 30 Minutes Intravenous Every 8 hours 06/06/24 0311 06/06/24 0704   06/06/24 0800  meropenem  (MERREM ) 1 g in sodium chloride  0.9 % 100 mL IVPB  Status:  Discontinued        1 g 200 mL/hr over 30 Minutes Intravenous Every 8 hours 06/06/24 0712 06/09/24 1406   06/06/24 0600  vancomycin  (VANCOCIN ) capsule 500 mg  Status:  Discontinued        500 mg Oral Every 6 hours 06/06/24 0310 06/06/24 1432   06/06/24 0400  metroNIDAZOLE  (FLAGYL ) IVPB 500 mg  Status:  Discontinued        500 mg 100 mL/hr over 60 Minutes Intravenous Every 8 hours 06/06/24 0310 06/06/24 0705   06/06/24 0200  vancomycin  (VANCOREADY) IVPB 2000 mg/400 mL  Status:  Discontinued        2,000 mg 100 mL/hr over 240 Minutes Intravenous  Once 06/06/24 0153 06/06/24 0705   06/06/24 0145  ampicillin  (OMNIPEN) 2 g in sodium chloride  0.9 % 100 mL IVPB        2 g 300 mL/hr over 20 Minutes Intravenous  Once 06/06/24 0139 06/06/24 0328   06/06/24 0000  ceFEPIme  (MAXIPIME ) 2 g in sodium chloride  0.9 % 100 mL IVPB        2 g 200 mL/hr over 30 Minutes Intravenous  Once 06/05/24 2358 06/06/24 0118         Component Value Date/Time   SDES BLOOD RIGHT HAND 06/10/2024 1813   SPECREQUEST  06/10/2024 1813    BOTTLES DRAWN AEROBIC AND ANAEROBIC Blood Culture results may not be optimal due to an inadequate volume of blood received in culture bottles   CULT  06/10/2024 1813    NO GROWTH 5 DAYS Performed at [facility], [address] [place], [place] [address]    REPTSTATUS 06/15/2024 FINAL 06/10/2024 1813    Procedures: Mennie LAMY, MD [facility] 06/18/2024, 11:53 AM

## 2024-06-19 DIAGNOSIS — A419 Sepsis, unspecified organism: Secondary | ICD-10-CM | POA: Diagnosis not present

## 2024-06-19 DIAGNOSIS — R6521 Severe sepsis with septic shock: Secondary | ICD-10-CM | POA: Diagnosis not present

## 2024-06-19 LAB — MAGNESIUM: Magnesium: 1.9 mg/dL (ref 1.7–2.4)

## 2024-06-19 LAB — GLUCOSE, CAPILLARY
Glucose-Capillary: 102 mg/dL — ABNORMAL HIGH (ref 70–99)
Glucose-Capillary: 104 mg/dL — ABNORMAL HIGH (ref 70–99)
Glucose-Capillary: 105 mg/dL — ABNORMAL HIGH (ref 70–99)
Glucose-Capillary: 99 mg/dL (ref 70–99)

## 2024-06-19 MED ORDER — HALOPERIDOL LACTATE 5 MG/ML IJ SOLN: 0.5000 mg | INTRAMUSCULAR | Status: DC | PRN

## 2024-06-19 MED ORDER — HALOPERIDOL LACTATE 2 MG/ML PO CONC: 0.5000 mg | ORAL | Status: DC | PRN

## 2024-06-19 MED ORDER — ACETAMINOPHEN 650 MG RE SUPP: 650.0000 mg | Freq: Four times a day (QID) | RECTAL | Status: DC | PRN

## 2024-06-19 MED ORDER — HALOPERIDOL 1 MG PO TABS: 0.5000 mg | ORAL_TABLET | ORAL | Status: DC | PRN

## 2024-06-19 MED ORDER — BIOTENE DRY MOUTH MT LIQD: 15.0000 mL | OROMUCOSAL | Status: DC | PRN

## 2024-06-19 MED ORDER — LORAZEPAM 2 MG/ML PO CONC: 1.0000 mg | ORAL | Status: DC | PRN

## 2024-06-19 MED ORDER — LORAZEPAM 1 MG PO TABS
1.0000 mg | ORAL_TABLET | ORAL | Status: DC | PRN
  Administered 2024-06-21 – 2024-06-22 (×2): 1 mg via ORAL
  Filled 2024-06-19 (×2): qty 1

## 2024-06-19 MED ORDER — HYDROMORPHONE HCL 1 MG/ML IJ SOLN
0.5000 mg | INTRAMUSCULAR | Status: DC | PRN
  Administered 2024-06-22 – 2024-06-23 (×2): 1 mg via INTRAVENOUS
  Filled 2024-06-19: qty 1
  Filled 2024-06-19: qty 2
  Filled 2024-06-19: qty 1

## 2024-06-19 MED ORDER — GLYCOPYRROLATE 0.2 MG/ML IJ SOLN: 0.2000 mg | INTRAMUSCULAR | Status: DC | PRN

## 2024-06-19 MED ORDER — ACETAMINOPHEN 325 MG PO TABS: 650.0000 mg | ORAL_TABLET | Freq: Four times a day (QID) | ORAL | Status: DC | PRN

## 2024-06-19 MED ORDER — POLYVINYL ALCOHOL 1.4 % OP SOLN: 1.0000 [drp] | Freq: Four times a day (QID) | OPHTHALMIC | Status: DC | PRN

## 2024-06-19 MED ORDER — LORAZEPAM 2 MG/ML IJ SOLN
1.0000 mg | INTRAMUSCULAR | Status: DC | PRN
  Administered 2024-06-23: 1 mg via INTRAVENOUS
  Filled 2024-06-19: qty 1

## 2024-06-19 MED ORDER — GLYCOPYRROLATE 1 MG PO TABS: 1.0000 mg | ORAL_TABLET | ORAL | Status: DC | PRN

## 2024-06-19 MED ADMIN — THERA M PLUS PO TABS: 1 | ORAL | @ 10:00:00 | NDC 1650058694

## 2024-06-19 NOTE — Progress Notes (Signed)
 Daily Progress Note   Patient Name: Tim [name]       Date: 06/19/2024 DOB: 10/07/1940  Age: 83 y.o. MRN#: [id number] Attending Physician: Christobal Guadalajara, MD Primary Care Physician: [name], MD Admit Date: 06/05/2024 Length of Stay: 13 days  Reason for Consultation/Follow-up: Establishing goals of care  Subjective:  Subjective: Chart reviewed including most recent notes from hospitalist, OT, PT, RT, TOC, and Dr. Jeffrie from cardiology.  Note that it is not felt he is going to be a candidate for change of his pacemaker battery due to overall decline in his health and functional status.  Labs reviewed and note sodium 140, potassium 3.7, creatinine 0.58, mag 1.8, WBC 6.0, hemoglobin 10.0.  Received secure chat from patient's nurse that family requesting to speak with me today as Tim [name] has had a change in his mindset and is now interested in a more comfort based approach to his care.  I saw and examined Tim [name] today.  His wife was at the bedside as well.  He reports that he is symptomatically feeling a little better today.  He is, however, worried about a plan for the future as he met with cardiology last night and discussed with Dr. Jeffrie regarding the fact that with his comorbidities and poor functional status, he may not be a candidate for placement of his pacemaker battery that is set to expire in February of this year.  His wife reports that he has been talking with her about his concerns maintaining the best quality of forward as they are beginning to understand that he likely has limited prognosis at this point in time.  She requested more information about palliative care which we reviewed palliative care, hospice care, comfort care, and how to go about decision making on which of these would be the best fit for his goals moving forward.  He reports that he would like to be at home, we discussed home hospice services.  His wife expressed concern that it would be  difficult for her to meet his care needs at home as home hospice is not the same as 24-hour in-home care.  They inquired about inpatient hospice as his father had been there for end-of-life.  We reviewed criteria for inpatient hospice unit and GIP level of care.  I relayed to them that I did not think he was going to meet criteria for this based upon current prognosis, care needs, and symptom management needs.  We also discussed potential for hospice services with long-term care.  Reviewed hospice Medicare benefit and that this does not include coverage for room and board fee at long-term care facility.  We discussed the pros and cons of each of these in detail.  Patient and his wife request evaluation for potential transition to  inpatient hospice.  He reports being told by another doctor he would likely qualify.  We again reviewed criteria for inpatient hospice and I expressed that we can request evaluation but will need to continue to explore other options if he is determined by hospice agency not to meet criteria for GIP admission.  Review of Systems Denies complaints reports appetite down a little but mood better today Objective:   Vital Signs:  BP (!) 117/55 (BP Location: Right Arm)   Pulse 85   Temp 98.3 F (36.8 C) (Oral)   Resp 18   Ht 6' 4 (1.93 m)   Wt 118.2 kg   SpO2 97%   BMI 31.72 kg/m   Physical  Exam: General: NAD, alert, confusion resolved today Eyes:  anicteric sclera HENT: normocephalic, atraumatic, moist mucous membranes Cardiovascular: RRR Respiratory: no increased work of breathing noted, not in respiratory distress Abdomen: not distended Neuro: A&O, following commands easily Psych: flat affect, tangential with circular thought patterns  Imaging: @IMAGES @  I personally reviewed recent imaging.   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  Recommendations/Plan: # Complex medical decision making/goals of care:  - Patient is now interested in consideration for comfort based approach.  We discussed hospice services in detail.  - Patient and wife requesting evaluation for inpatient hospice.  Plan for referral, but we also discussed need to continue to explore other options as I do not think he meets criteria at this point. -  Code Status: Limited: Do not attempt resuscitation (DNR) -DNR-LIMITED -Do Not Intubate/DNI   Prognosis: Unable to determine  # Discharge Planning: To be determined  Discussed with: patient, wife, bedside care team Thank you for allowing the palliative care team to participate in the care Tim [name].  Amaryllis Meissner, MD Palliative Care Provider PMT # [phone]  If patient remains symptomatic despite maximum doses, please call PMT at [phone] between 0700 and 1900. Outside of these hours, please call attending, as PMT does not have night coverage.   I personally spent a total of 51 minutes in the care of the patient today including preparing to see the patient, getting/reviewing separately obtained history, performing a medically appropriate exam/evaluation, counseling and educating, placing orders, referring and communicating with other health care professionals, and coordinating care.

## 2024-06-19 NOTE — Progress Notes (Signed)
 [facility]   Referral for pt to go to the Hospice facility at [name] in [name]. Spoke to the pt's wife and discussed hospice care. She is tearful but acknowledges this is the most peaceful thing she could do to help her husband. We discussed stopping the aggressive medications and having his AICD deactivated and she feels that this is what her husband would want. She states she thinks he doesn't want to take any more meds either. I discussed criteria for pt to go the in patient facility. She would like for me toto proceed with seeing if my MD will approve pt for services.   Dr. Ubaldo our MD does not feel the pt meets our criteria at this time to transfer to the in patient facility but would be hospice appropriate at home or in SNF.   Spoke to Dr. Oneita with PC about the discussion and he will discuss with pt and wife possible changing to full comfort in  hospital and deactivating ICD to see if he continues to decline and then I will re-evaluate if needed.   Magdalena Berber RN [phone]

## 2024-06-19 NOTE — Progress Notes (Signed)
 PROGRESS NOTE BLESS BELSHE  FMW:[id number] DOB: 03/04/1941 DOA: 06/05/2024 PCP: [name], MD  Brief Narrative/Hospital Course: Tim [name] is a 83 y.o. 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 Patient has history of enterococcal infection of the AICD lead that is being treated with chronic amoxicillin .   Recent admission at [facility] on 10/9-10/17:mixed hypovolemic/septic shock. At [facility] on 10/29-11/1:[name] treated w/ vanc. Wife states he has not had diarrhea symptoms and off po vanc. Did have diarrhea on 12/3 and so did wife. Wife also states he has poor po intake.  On 12/4 overnight patient had high fever. EMS called found temp of 103.7. In [name] 99.3 F and wbc 6.7. LA 5. Chronic foley in place w/ hematuria which was replaced. UA inconclusive. INR 3.7. CXR unremarkable. Cultures obtained and started on broad spectrum abx. Despite fluids remains hypotensive started on levoPHED  12/5 admit to [facility] 12/6 spiked fever with Tmax of 100.7, blood culture is growing ESBL gram-negative rods with E. coli/Klebsiella.  Vasopressor requirement improving, Levophed  at 13, vasopressin  at 0.03 unit.  Foley catheter was exchanged by urology, no more hematuria 12/8 norepinephrine  requirements coming down.  Platelet count stabilized.  LFTs climbing compared to last 72 hours 12/10: Transferred to [facility] service 12-11/12 having episodes of vomiting x-ray shows ileus versus gastroparesis> has resolved and tolerating p.o. diet Seen by cardiology 12/16 -palliative care following, patient opting for comfort measures 12/17  Subjective: Seen and examined Wife at the bedside patient is resting comfortably somewhat tearful Complains of low back pain nursing notified for pain  medication.  No significant diarrhea Overnight afebrile vital stable.  Blood sugar 104 magnesium  1.9.  Last CBC BMP fairly stable 12/17 Waiting to hear from hospice team  Assessment and plan:  Goals of care Deconditioning/debility Recurrent hospitalization Poor oral intake Patient is DNR limited: he has had very poor intake very deconditioned with complex medical comorbidities Created space and opportunity for patient to explore thoughts, feelings and fears regarding current medical situation-and also provided therapeutic listening -wife is very overwhelmed with his medical condition and his overall decline-and also anxious about his rehab. Again he is at high risk for readmission decompensation overall prognosis guarded.  He had a long conversation with cardiology  At this time patient is opting for palliative care approach/hospice,-hospice team to evaluate.  Palliative care following. Patient and wife understand that he had complex medical comorbidities has been in and out of hospital with multiple serious critical illness. Continue to augment diet, continue Remeron .  Palliative care to follow.    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 Blood culture12/4-5: E. coli and Klebsiella - repeat blood culture 12/9 NGTD  Urine culture- E. coli positive and hematuria Foley changed by urology 12/11 [name] Significantly leukocytosis 56k - which is resolved, vss and afebrile.  Completed meropenem   and now on home Amoxicillin  12/15>>  and s/p PO vancomycin  for [name] prophylaxis Needs outpatient follow-up with Dr. Renda regarding his urological issues  Paroxysmal A-fib with  RVR: H/o CHB, has PPM History of hypertension-his Coreg  amlodipine losartan  has been on hold since October AS S/P TAVR-Last TEE in October 2025 showed ejection fraction 65 to 70%  Anasarca: Rate stable.His Eliquis  has been on hold since October ( he had gross hematuira and eliquis  was held that time)- This admit home RN exchanged Foley and he had gross hematuria after that and admitted to [facility] with septic shock- 12/15 Discussed w/ Dr Renda from uro-could consider Eliquis  trial, seen by cardiology 12/16 at this time recommended to hold Eliquis  given his recurrent hematuria, still has Foley in place  He has been tolerating DVT prophylaxis. Needs PPM replacement Feb 2026-patient reports she has not been able to get back to her cardiologist> seen by cardiology team here, he will need to follow-up with Dr. Cornelia at [facility] unlikely is candidate for aggressive measures-He does not appear to be a candidate for valve or structural team procedure at this point given his recent sepsis Beta-blocker antihypertensive remains on hold, continue Ranexa , torsemide , Crestor   VomitingX2 on 12/12 Possible ileus versus gastroparesis on imaging: Repeat x-ray improved, initially on Reglan  and discontinued.  Tolerating p.o., having bowel movement.  Continue to augment nutritional status.  Remeron   Hypokalemia Hypomagnesemia: Resolved  Thrombocytopenia: likely 2/2 sepsis and antibiotics, resolved   Anemia of chronic illness Stable.  Monitor and transfuse for Hb <7   Hyperglycemia Stable, cont SSI PRN  Acute metabolic encephalopathy: Suspecting encephalopathy and delirium in the setting of sepsis and ICU admission.  Mental status has improved but remains deconditioned and weak  Chronic back pain on chronic opiates: Patient reports no surgeon could fix his back, PTA on oxycodone  20 mg prn, gabapentin  800.  Continue pain meds and low-dose gabapentin  as tolerated  H/o C diff Continue suppressive vancomycin   while on meropenem .has had intermittent constipation but now loose stool continue symptomatic management .   Elevated transaminases: Likely in the setting of shock, resolving.     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  Now on Remeron  or Cymbalta  due to vomiting, also stopping Abilify  can resume if has paranoid thoughts again   ? Trigeminy in monitor: Has paced rhythm, asymptomatic monitor electrolytes continue telemetry.  Continue to replace electrolytes  Class I Obesity w/ Body mass index is 31.72 kg/m.: Will benefit with PCP follow-up, weight loss,healthy lifestyle and outpatient sleep eval if not done.  Left upper extremity single-lumen  midline 12/9 Foley in place   Mobility: PT Orders: Active PT Follow up Rec: Skilled Nursing-Short Term Rehab (<3 Hours/Day)06/18/2024 1500   DVT prophylaxis: SCDs Start: 06/06/24 0316 lovenox  Code Status:   Code Status: Limited: Do not attempt resuscitation (DNR) -DNR-LIMITED -Do Not Intubate/DNI  Family Communication: plan of care discussed with patient/wife at bedside updated Patient status is: Remains hospitalized because of severity of illness Level of care: Med-Surg   Dispo: The patient is from: HOME            Anticipated disposition: tbd.Waiting to hear from hospice team  Objective: Vitals last 24 hrs: Vitals:   06/19/24 0500 06/19/24 0734 06/19/24 0739 06/19/24 0829  BP:    101/64  Pulse:    84  Resp:    17  Temp:    98.7 F (37.1 C)  TempSrc:    Oral  SpO2:  98% 97% 97%  Weight: 118.2 kg     Height:       Physical Examination: General exam: AOOx3, morbidly obese but pleasant frail HEENT:Oral mucosa moist, Ear/Nose WNL grossly Respiratory system:  Clear to auscultation bilaterally diminished at bases, no use of accessory muscle Cardiovascular system: S1 & S2 +, No JVD. Gastrointestinal system: Abdomen soft, very obese,NT BS+ Nervous  System: Alert, awake, moving all extremities,and following commands. Extremities: extremities warm, leg edema trace, left TMA old healed. Skin: Warm, no rashes MSK: Normal muscle bulk,tone, power   Medications reviewed:  Scheduled Meds:  acetaminophen   650 mg Oral TID   amoxicillin   1,000 mg Oral BID   arformoterol   15 mcg Nebulization Q12H   budesonide  (PULMICORT ) nebulizer solution  0.5 mg Nebulization BID   Chlorhexidine  Gluconate Cloth  6 each Topical Daily   collagenase   1 Application Topical Daily   enoxaparin  (LOVENOX ) injection  60 mg Subcutaneous Q24H   ezetimibe   10 mg Oral Daily   famotidine   20 mg Oral BID   feeding supplement  237 mL Oral TID BM   gabapentin   100 mg Oral TID   Gerhardt's butt cream   Topical BID   insulin  aspart  0-6 Units Subcutaneous TID AC & HS   lidocaine   1 patch Transdermal Q24H   lidocaine   1 Application Urethral Once   mirtazapine   15 mg Oral QHS   multivitamin with minerals  1 tablet Oral Daily   ranolazine   500 mg Oral BID   revefenacin   175 mcg Nebulization Daily   rosuvastatin   20 mg Oral Daily   sodium chloride  flush  10-40 mL Intracatheter Q12H   thiamine   100 mg Oral Daily   torsemide   20 mg Oral Daily   Continuous Infusions:    Diet: Diet Order             DIET SOFT Room service appropriate? Yes with Assist; Fluid consistency: Thin  Diet effective now                 Sick patient will resume Data Reviewed: I have personally reviewed following labs and imaging studies ( see epic result tab) CBC: Recent Labs  Lab 06/14/24 0249 06/15/24 0330 06/16/24 0626 06/17/24 0301 06/18/24 0315  WBC 9.1 5.7 6.2 5.3 6.0  HGB 9.8* 9.2* 10.2* 9.4* 10.0*  HCT 29.6* 27.8* 32.3* 28.6* 30.8*  MCV 98.0 98.6 100.0 96.6 97.8  PLT 123* 106* 126* 137* 165   CMP: Recent Labs  Lab 06/14/24 0249 06/15/24 0330 06/16/24 0626 06/17/24 0301 06/18/24 0315 06/19/24 0343  NA 141 139 137 139 140  --   K 3.3* 3.4* 4.0 2.9* 3.7  --   CL  106 103 103 100 102  --   CO2 28 29 24 30 30   --   GLUCOSE 106* 85 75 95 126*  --   BUN 14 13 15 13 16   --   CREATININE 0.55* 0.63 0.68 0.61 0.58*  --   CALCIUM  8.0* 7.6* 7.8* 7.7* 8.5*  --   MG 1.4* 1.4* 1.8 1.6* 1.8 1.9   GFR: Estimated Creatinine Clearance: 96.6 mL/min (A) (by C-G formula based on SCr of 0.58 mg/dL (L)). No results for input(s): AST, ALT, ALKPHOS, BILITOT, PROT, ALBUMIN in the last 168 hours.  No results for input(s): LIPASE, AMYLASE in the last 168 hours.  No results for input(s): AMMONIA in the last 168 hours.  Coagulation Profile:  No results for input(s): INR, PROTIME in the last 168 hours.  Unresulted Labs (From admission, onward)    None      Antimicrobials/Microbiology: Anti-infectives (From admission, onward)    Start     Dose/Rate Route Frequency Ordered Stop  06/16/24 1000  amoxicillin  (AMOXIL ) capsule 1,000 mg        1,000 mg Oral 2 times daily 06/09/24 1406     06/10/24 1000  vancomycin  (VANCOCIN ) capsule 125 mg        125 mg Oral Daily 06/09/24 1406 06/17/24 1124   06/09/24 1600  meropenem  (MERREM ) 1 g in sodium chloride  0.9 % 100 mL IVPB        1 g 200 mL/hr over 30 Minutes Intravenous Every 8 hours 06/09/24 1406 06/16/24 0759   06/07/24 1000  vancomycin  (VANCOCIN ) capsule 125 mg  Status:  Discontinued        125 mg Oral Daily 06/06/24 1432 06/09/24 1406   06/06/24 1000  linezolid  (ZYVOX ) IVPB 600 mg  Status:  Discontinued        600 mg 300 mL/hr over 60 Minutes Intravenous Every 12 hours 06/06/24 0704 06/06/24 1432   06/06/24 0800  ceFEPIme  (MAXIPIME ) 2 g in sodium chloride  0.9 % 100 mL IVPB  Status:  Discontinued        2 g 200 mL/hr over 30 Minutes Intravenous Every 8 hours 06/06/24 0311 06/06/24 0704   06/06/24 0800  meropenem  (MERREM ) 1 g in sodium chloride  0.9 % 100 mL IVPB  Status:  Discontinued        1 g 200 mL/hr over 30 Minutes Intravenous Every 8 hours 06/06/24 0712 06/09/24 1406   06/06/24 0600   vancomycin  (VANCOCIN ) capsule 500 mg  Status:  Discontinued        500 mg Oral Every 6 hours 06/06/24 0310 06/06/24 1432   06/06/24 0400  metroNIDAZOLE  (FLAGYL ) IVPB 500 mg  Status:  Discontinued        500 mg 100 mL/hr over 60 Minutes Intravenous Every 8 hours 06/06/24 0310 06/06/24 0705   06/06/24 0200  vancomycin  (VANCOREADY) IVPB 2000 mg/400 mL  Status:  Discontinued        2,000 mg 100 mL/hr over 240 Minutes Intravenous  Once 06/06/24 0153 06/06/24 0705   06/06/24 0145  ampicillin  (OMNIPEN) 2 g in sodium chloride  0.9 % 100 mL IVPB        2 g 300 mL/hr over 20 Minutes Intravenous  Once 06/06/24 0139 06/06/24 0328   06/06/24 0000  ceFEPIme  (MAXIPIME ) 2 g in sodium chloride  0.9 % 100 mL IVPB        2 g 200 mL/hr over 30 Minutes Intravenous  Once 06/05/24 2358 06/06/24 0118         Component Value Date/Time   SDES BLOOD RIGHT HAND 06/10/2024 1813   SPECREQUEST  06/10/2024 1813    BOTTLES DRAWN AEROBIC AND ANAEROBIC Blood Culture results may not be optimal due to an inadequate volume of blood received in culture bottles   CULT  06/10/2024 1813    NO GROWTH 5 DAYS Performed at [facility], [address] [place], [place] [address]    REPTSTATUS 06/15/2024 FINAL 06/10/2024 1813    Procedures: Mennie LAMY, MD [facility] 06/19/2024, 1:11 PM

## 2024-06-19 NOTE — Progress Notes (Signed)
 Report given to receiving RN on 24M.

## 2024-06-19 NOTE — Progress Notes (Signed)
 Daily Progress Note   Patient Name: Tim [name]       Date: 06/19/2024 DOB: 07-Jul-1940  Age: 83 y.o. MRN#: [id number] Attending Physician: Tim Guadalajara, MD Primary Care Physician: Tim Aleene DEL, MD Admit Date: 06/05/2024 Length of Stay: 13 days  Reason for Consultation/Follow-up: Establishing goals of care  Subjective:  Subjective: Chart reviewed including most recent notes from hospitalist, OT, PT, OT, TOC and hospice liaison.  He does not currently meet criteria for inpatient hospice.  Case discussed in detail with Dr. CHRISTOBAL in person and with hospice liaison via phone.  Labs reviewed today and notable for magnesium  1.9.  I saw and examined Tim [name] today.  His wife was at the bedside as well.  We discussed in detail regarding clinical course over the last several days with his feeling that he continues to have decline with his overall clinical status.  He reports that his goal is now to focus on and he wants to ensure that he is pain-free and other symptoms are well-controlled as he reaches end-of-life.  We discussed conversation with liaison fact that he does not currently meet criteria for inpatient hospice unit.    We discussed options of SNF for rehab, LTC with hospice, or home with hospice as options for care at the time of discharge.  He and his wife remain very concerned about any plan for him to go to skilled facility and she reports that they will transition home with hospice if he is not able to transition to inpatient hospice at time of discharge.   She then talked with Octaviano about concern of ensuring he is comfortable at home.  She inquired about stopping his medications and working to transition to comfort care here in the hospital.  If he were to decline quickly after this transition, they would want to see if he will be eligible for him patient hospice.  I expressed that I am not sure he will decline quickly enough to qualify for the inpatient hospice directly from  the hospital even with a shift to full comfort, but if his wish is to transition to full comfort we would honor that with a focus on aggressive symptom management to ensure that he is as comfortable as possible.  We specifically medications related to his cardiac condition, his multiple inhalers, and amoxicillin  for endocarditis.  He would like to stop each of these.  We also discussed discontinuation of his ICD and he was agreeable to this as well.  Discussed details of shift to full comfort and working to treat pain, anxiety, agitation, shortness of breath, and nausea.  Also discussed that if he does not acutely decline over the next 24 to 48 hours, we will need to have plan in place to work to transition home with hospice support if this is the goal if he is in eligible for inpatient hospice.  Review of Systems Denies complaints reports appetite down a little but mood better today Objective:   Vital Signs:  BP 118/63 (BP Location: Right Arm)   Pulse 85   Temp 98.7 F (37.1 C) (Axillary)   Resp 19   Ht 6' 4 (1.93 m)   Wt 118.2 kg   SpO2 100%   BMI 31.72 kg/m   Physical Exam: General: NAD, alert, confusion resolved today Eyes:  anicteric sclera HENT: normocephalic, atraumatic, moist mucous membranes Cardiovascular: RRR Respiratory: no increased work of breathing noted, not in respiratory distress Abdomen: not distended Neuro: A&O, following commands easily Psych: flat  affect, tangential with circular thought patterns  Imaging: @IMAGES @  I personally reviewed recent imaging.   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  Recommendations/Plan: # Complex medical decision making/goals of care:  -  Patient evaluated and does not meet criteria for inpatient hospice at this time.    - He and wife both expressed desire to transition to full comfort care.  He no longer wants to take medications that are not specifically related to his comfort including discontinuation of amoxicillin  for suppression of his endocarditis.  - If he acutely declines over the next 24 to 48 hours after transitioning to full comfort, goal will be to transition to inpatient hospice for end-of-life care.  Otherwise, his wife expressed desire to take him home with hospice support.  If he does end up transitioning home with hospice support, she states that she is still deciding if she wants to transition home with [facility] or with hospice of the [place].  - Orders placed for full comfort for comfort care order set.  - While I am not on service tomorrow, I will ask another member of the palliative care team to follow-up to continue evaluation, symptom management, and conversations regarding goals of care and disposition planning. -  Code Status: Do not attempt resuscitation (DNR) - Comfort care  # Symptom management  - Pain: Dilaudid  as needed  - Anxiety: Ativan  as needed  - Agitation: Haldol  as needed  - Excess secretions: Robinul  as needed  - Nausea: Zofran  or Haldol  as needed  Prognosis: Unable to determine  # Discharge Planning: To be determined-either inpatient hospice or home with hospice services in the next 48 hours  Discussed with: patient, wife, bedside care team Thank you for allowing the palliative care team to participate in the care Tim [name].  Tim Meissner, MD Palliative Care Provider PMT # [phone]  If patient remains symptomatic despite maximum doses, please call PMT at [phone] between 0700 and 1900. Outside of these hours, please call attending, as PMT does not have night coverage.   I personally spent a total of 60 minutes in the care of the patient today including preparing to see  the patient, getting/reviewing separately obtained history, performing a medically appropriate exam/evaluation, counseling and educating, placing orders, referring and communicating with other health care professionals, documenting clinical information in the EHR, and coordinating care.

## 2024-06-19 NOTE — Progress Notes (Signed)
 Call placed to [employer], request made for ICD to be turned off.

## 2024-06-20 ENCOUNTER — Other Ambulatory Visit: Payer: Self-pay | Admitting: Family Medicine

## 2024-06-20 DIAGNOSIS — A419 Sepsis, unspecified organism: Secondary | ICD-10-CM | POA: Diagnosis not present

## 2024-06-20 DIAGNOSIS — R6521 Severe sepsis with septic shock: Secondary | ICD-10-CM | POA: Diagnosis not present

## 2024-06-20 NOTE — TOC Progression Note (Addendum)
 Transition of Care [name]) - Progression Note    Patient Details  Name: Tim [name] MRN: [id number] Date of Birth: 08-24-1940  [facility] CM/SW Contact  Lendia Dais, [place] Phone Number: 06/20/2024, 12:46 PM  Clinical Narrative: Pt is now comfort care and being followed by palliative. Pt will be monitored for acute declination for inpatient hospice. If no declination, spouse wishes would be for the pt to dc home w/ home hospice.  CSW will continue to follow.     Expected Discharge Plan: Skilled Nursing Facility Barriers to Discharge: Awaiting State Approval (PASRR), Continued Medical Work up               Expected Discharge Plan and Services In-house Referral: Clinical Social Work Discharge Planning Services: CM Consult Post Acute Care Choice: Home Health, Resumption of Svcs/PTA Provider Living arrangements for the past 2 months: Single Family Home                           HH Arranged: RN, PT HH [facility] Date HH Agency Contacted: 06/10/24 Time HH Agency Contacted: 1455 Representative spoke with at [facility] Agency: [facility]   Social Drivers of Health (SDOH) Interventions SDOH Screenings   Food Insecurity: No Food Insecurity (06/07/2024)  Housing: Low Risk (06/07/2024)  Recent Concern: Housing - High Risk (05/01/2024)   Received from [facility]  Transportation Needs: No Transportation Needs (06/07/2024)  Utilities: Not At Risk (06/07/2024)  Depression (PHQ2-9): Low Risk (03/31/2024)  Financial Resource Strain: Low Risk (09/23/2023)  Physical Activity: Unknown (09/23/2023)  Social Connections: Moderately Isolated (06/07/2024)  Stress: No Stress Concern Present (05/01/2024)   Received from [facility]  Tobacco Use: Low Risk (06/05/2024)    Readmission Risk Interventions    04/15/2024    3:20 PM 04/14/2024    4:33 PM  Readmission Risk Prevention Plan  Transportation Screening Complete Complete  PCP or  Specialist Appt within 5-7 Days Complete Complete  Home Care Screening Complete Complete  Medication Review (RN CM) Complete Referral to Pharmacy

## 2024-06-20 NOTE — Progress Notes (Signed)
 [employer] saw patient, and deactivated ICD per report. Donley Cammie Aureliano VEAR Vicci 06/20/2024 1:27 PM

## 2024-06-20 NOTE — Progress Notes (Signed)
 "  Palliative Medicine Inpatient Follow Up Note   HPI:  83 y.o. 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    Patient has history of enterococcal infection of the AICD lead that is being treated with chronic amoxicillin .  Patient recently admitted at [facility] from 10/9-10/17 with mixed hypovolemic/septic shock.  Also noted patient has been admitted to [facility] from 10/29-11/1 with [name] and was treated with vancomycin .  Wife states he has poor oral intake.  On 06/05/2024 overnight patient had high fever.  EMS called found temperature of 103.7.  Patient has chronic Foley in place with hematuria, Foley replaced.  Despite IV fluids remains hypotensive, therefore started on levo drip.  Transferred to ICU services for intensive care management.   Work note that patient has had 2 inpatient admissions in the last 6 months.  This is hospital stay day #14.   Transitioned to comfort-focused care on 06/19/2024  Today's Discussion 06/20/2024   I have reviewed medical records including:  EPIC notes: Reviewed PMT provider note from 06/19/2024. Transitioned patient to full comfort care. If continued decline, goal will be transitioning to inpatient hospice, otherwise, his wife expressed her desire to take him home with hospice support.   Vital signs: T 98.90F, BP 132/61, HR 84 BPM, 92% on RA MAR: Reviewed PRN meds received over the last 24 hours. He received a total of 20mg  of PRN Oxycodone . Reviewed to assess needs for medication adjustment to optimize comfort.  Available advanced directives in ACP: Advance directive documents available on file.  Labs:  None available, patient  on EOL pathway   Met with patient/family to discuss diagnosis prognosis, GOC, EOL wishes, disposition and options.  Created space and opportunity for patient to explore thoughts feelings and fears regarding current medical situation.  The patient was visited at bedside today and found resting comfortably in Semi-Fowler's position, without any signs of acute distress.  He was more alert and oriented, and was able to make his needs known, and reported that his sacral area was hurting, but after that he stated he was doing well overall.  During our conversation, he shared that he is now on a comfort focused pathway and does not wish to pursue any life-prolonging measures.  He expressed that he is at peace with his decision, stating, there is no other option left, my health is continuing to decline.  I reassured him that moving forward, our priority will be his comfort, and the medical team will focus on symptom management to optimize his comfort and quality of life.  We revisited disposition options, discussing that if his condition declines in the next 24 to 48 hours and he is deemed eligible, inpatient hospice admission would be the appropriate route.  If he remains stable and is not appropriate for inpatient hospice, both the patient and his wife prefer for him to be at home with hospice support from [place].  We also discussed that if his symptoms become difficult to manage at home, he may be able to transition to [facility] inpatient hospice facility in [place] to optimize symptom management.  Both the patient and his wife became tearful and emotional as they reflected on their life together.  The patient expressed both significant worry and  concern about leaving his wife behind, noting that they have always relied on a charter for support and have kept mostly to themselves over the years.  I have knowledge distracted their relationship and affirmed that their decision to spend quality time  together was meaningful.  Anticipatory care needs were addressed during the visit with  The patient's wife had insightful questions regarding hospice care at home, specifically about the provision of medical durable equipment and staff support.  I provided education in this aspects to ensure she felt informed and supported.  Additionally, I contacted [employer] to discuss and follow-up on the plan to disable the patient's ICD.  I later learned from the RN's note that the [employer] representative had come to deactivate the patient's ICD.  Provided education and counseling at length on the philosophy and benefits of hospice care. Discussed that it offers a holistic approach to care in the setting of end-stage illness, and is about supporting the patient where they are allowing nature to take it's course. Discussed the hospice team includes RNs, physicians, social workers, and chaplains. They can provide personal care, support for the family, and help keep patient out of the hospital as well as assist with DME needs for home hospice. Education provided on the difference between home vs residential hospice.  Education offered on focusing on a comfort and dignity approach allowing for natural death versus interventions attempting to prolong life when we see that the body is failing to thrive.  Education offered on the natural trajectory and expectations at end-of-life.   Questions and concerns addressed   Palliative Support Provided.   Objective Assessment: Vital Signs Vitals:   06/19/24 1951 06/20/24 0821  BP: (!) 122/59 132/61  Pulse: 89 91  Resp: 19 18  Temp: 98.5 F (36.9 C) 98.4 F (36.9 C)  SpO2: 95% 92%    Intake/Output Summary (Last 24 hours) at 06/20/2024 9046 Last data filed at 06/20/2024 0600 Gross per 24 hour  Intake 370 ml  Output 2960 ml  Net -2590 ml   Last Weight  Most recent update: 06/19/2024  6:51 AM    Weight  118.2 kg (260 lb 9.3 oz)             Gen: Alert,  oriented HEENT: mucous membranes are moist CV: Regular rate at 89 BPM PULM: clear, but diminished bilaterally, not in resp distress ABD: soft and non-tender ZKU:Zizfj noted to 4 extremities Neuro: following commands  SUMMARY OF RECOMMENDATIONS    # Complex medical decision-making/goals of care CODE STATUS: DNR comfort Patient to be reevaluated tomorrow 06/21/2024 for inpatient hospice eligibility. Patient's ICD deactivated 06/20/2024 If patient will acutely decline over the next 24 to 48 hours, goal is to transition to inpatient hospice for end-of-life care.  Otherwise, his wife expressed desire to take him home with hospice support. Continue to provide psychosocial and emotional support to patient and family  Palliative medicine team will continue to follow Comfort card for family Unrestricted visitations in the setting of EOL (per policy)  # Symptom Management:  Acetaminophen  PRN for mild pain/discomfort Dilaudid  PRN for pain/air hunger/comfort Robinul  PRN for excessive secretions Ativan  PRN for agitation/anxiety Zofran  PRN for nausea Artificial tears PRN for dry eyes Haldol  PRN for agitation/anxiety May have comfort feeding Oxygen PRN 2L or less for comfort. No escalation.    # Discharge planning To be determined  Discussed with: Patient, patient's wife [name], nursing, and medical team.  I personally spent a total of 50 minutes in the care  of the patient today including preparing to see the patient, getting/reviewing separately obtained history, performing a medically appropriate exam/evaluation, counseling and educating, placing orders, referring and communicating with other health care professionals, documenting clinical information in the EHR, communicating results, and coordinating care.    ______________________________________________________________________________________ Kathlyne Bolder NP-C [place] Palliative Medicine Team Team Cell Phone:  [phone] Please utilize secure chat with additional questions, if there is no response within 30 minutes please call the above phone number  Palliative Medicine Team providers are available by phone from 7am to 7pm daily and can be reached through the team cell phone.  Should this patient require assistance outside of these hours, please call the patient's attending physician.     "

## 2024-06-20 NOTE — Progress Notes (Signed)
 Nutrition Brief Note  Chart reviewed. Pt now transitioning to comfort care.  No further nutrition interventions planned at this time.  Please re-consult as needed.   Josette Glance, MS, RDN, LDN Clinical Dietitian I Please reach out via secure chat

## 2024-06-20 NOTE — Progress Notes (Signed)
 " PROGRESS NOTE Tim [name]  FMW:[id number] DOB: 1940-11-20 DOA: 06/05/2024 PCP: Tim Aleene DEL, MD  Brief Narrative/Hospital Course: Tim [name] is a 83 y.o. 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 Patient has history of enterococcal infection of the AICD lead that is being treated with chronic amoxicillin .   Recent admission at [facility] on 10/9-10/17:mixed hypovolemic/septic shock. At [facility] on 10/29-11/1:Tim [name] treated w/ vanc. Wife states he has not had diarrhea symptoms and off po vanc. Did have diarrhea on 12/3 and so did wife. Wife also states he has poor po intake.  On 12/4 overnight patient had high fever. EMS called found temp of 103.7. In [name] 99.3 F and wbc 6.7. LA 5. Chronic foley in place w/ hematuria which was replaced. UA inconclusive. INR 3.7. CXR unremarkable. Cultures obtained and started on broad spectrum abx. Despite fluids remains hypotensive started on levoPHED  12/5 admit to [name] 12/6 spiked fever with Tmax of 100.7, blood culture is growing ESBL gram-negative rods with E. coli/Klebsiella.  Vasopressor requirement improving, Levophed  at 13, vasopressin  at 0.03 unit.  Foley catheter was exchanged by urology, no more hematuria 12/8 norepinephrine  requirements coming down.  Platelet count stabilized.  LFTs climbing compared to last 72 hours 12/10: Transferred to [facility] service 12-11/12 having episodes of vomiting x-ray shows ileus versus gastroparesis> has resolved and tolerating p.o. diet Seen by cardiology 12/16 -palliative care following, patient opting for comfort measures 12/17 After extensive palliative meeting 12/18 patient decided to stop all meds except for comfort meds, and also pacemaker to be turned off  Subjective: Seen and  examined Wife at the bedside.  [name] awake complains of pain on the low back and also left leg Palliative just saw and adjusting pain medication Remains on medication including diuretics and comforting meds and as needed opiates Ativan  for comfort  Assessment and plan:  Goals of care Deconditioning/debility Recurrent hospitalization Poor oral intake Patient is DNR limited: he has had very poor intake very deconditioned with complex medical comorbidities Created space and opportunity for patient to explore thoughts, feelings and fears regarding current medical situation-and also provided therapeutic listening -wife is very overwhelmed with his medical condition and his overall decline-and also anxious about his rehab. Again he is at high risk for readmission decompensation overall prognosis guarded.  He had a long conversation with cardiology  At this time patient is opting for palliative care approach/hospice,-hospice team evaluated did not meet criteria for inpatient hospice at this time Patient wife unable to care for him at home.  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 Blood culture12/4-5: E. coli and Klebsiella - repeat blood culture 12/9 NGTD  Urine culture- E. coli positive and hematuria Foley changed by urology 12/11 [name] Significantly leukocytosis 56k - which is resolved, vss and afebrile.  Completed meropenem   and now on home Amoxicillin  12/15>> STOPPED 12/18 FOR COMFORT CARE S/P PO vancomycin  for Tim [name]  prophylaxis  Paroxysmal A-fib  with RVR: H/o CHB, has PPM History of hypertension-his Coreg amlodipine losartan  has been on hold since October AS S/P TAVR-Last TEE in October 2025 showed ejection fraction 65 to 70%  Anasarca: Rate stable.His Eliquis  has been on hold since October ( he had gross hematuira and eliquis  was held that time)- This admit home RN exchanged Foley and he had gross hematuria after that and admitted to [facility] with septic shock- 12/15 Discussed w/ Tim [name] from uro-could consider Eliquis  trial, seen by cardiology 12/16 at this time recommended to hold Eliquis  given his recurrent hematuria, still has Foley in place  He has been tolerating DVT prophylaxis. Needs PPM replacement Feb 2026-patient reports she has not been able to get back to her cardiologist> seen by cardiology team here, he will need to follow-up with Tim [name] at [facility] unlikely is candidate for aggressive measures-He does not appear to be a candidate for valve or structural team procedure at this point given his recent sepsis. Beta-blocker antihypertensive remains on hold, Now on torsemide  only.  VomitingX2 on 12/12 Possible ileus versus gastroparesis on imaging: OFF Reglan .Tolerating p.o.  Hypokalemia Hypomagnesemia: Resolved  Thrombocytopenia: likely 2/2 sepsis and antibiotics, resolved   Anemia of chronic illness Stable.  Monitor and transfuse for Hb <7   Hyperglycemia Stable  Acute metabolic encephalopathy: Suspecting encephalopathy and delirium in the setting of sepsis and ICU admission.  Mental status has improved but remains deconditioned and weak  Chronic back pain on chronic opiates: Patient reports no surgeon could fix his back, PTA on oxycodone  20 mg prn, gabapentin  800.  Now on comfort measures with opiates for pain management as #1  H/o C diff Continue suppressive vancomycin  while on meropenem .has had intermittent constipation but now loose stool continue symptomatic management  .   Elevated transaminases: Likely in the setting of shock, resolving.     Adjustment disorder versus MDD/anxiety/paranoid thoughts Suicidal ideation: Psych has been-safety sitter has been discontinued, no SI.  Continue Remeron  bedtime rest OF THE MEDS STOPPED   ? Trigeminy in monitor: Has paced rhythm, asymptomatic   Class I Obesity w/ Body mass index is 31.72 kg/m.: Will benefit with PCP follow-up, weight loss,healthy lifestyle and outpatient sleep eval if not done.  Left upper extremity single-lumen  midline 12/9 Foley in place   Mobility: PT Orders: Active PT Follow up Rec: Skilled Nursing-Short Term Rehab (<3 Hours/Day)06/18/2024 1500   DVT prophylaxis:  lovenox  Code Status:   Code Status: Do not attempt resuscitation (DNR) - Comfort care Family Communication: plan of care discussed with patient/wife at bedside updated Patient status is: Remains hospitalized because of severity of illness Level of care: Med-Surg   Dispo: The patient is from: HOME            Anticipated disposition: tbd PLAN FOR HOSPICE  Objective: Vitals last 24 hrs: Vitals:   06/19/24 1130 06/19/24 1726 06/19/24 1951 06/20/24 0821  BP: 118/63 120/63 (!) 122/59 132/61  Pulse: 85 89 89 91  Resp: 19 19 19 18   Temp: 98.7 F (37.1 C) 98.1 F (36.7 C) 98.5 F (36.9 C) 98.4 F (36.9 C)  TempSrc: Axillary  Oral Oral  SpO2: 100% 97% 95% 92%  Weight:      Height:       Physical Examination: General exam: AOOx3 obese and ill-appearing HEENT:Oral mucosa moist, Ear/Nose WNL grossly Respiratory system: Clear to auscultation bilaterally diminished at bases, no use of accessory muscle Cardiovascular system: S1 & S2 +, No JVD. Gastrointestinal system: Abdomen soft, very obese,NT  BS+ Nervous System: Alert, awake, moving all extremities,and following commands. Extremities: extremities warm, leg edema trace, left TMA old healed. Skin: Warm, no rashes MSK: Normal muscle bulk,tone, power   Medications  reviewed:  Scheduled Meds:  Chlorhexidine  Gluconate Cloth  6 each Topical Daily   collagenase   1 Application Topical Daily   famotidine   20 mg Oral BID   feeding supplement  237 mL Oral TID BM   gabapentin   100 mg Oral TID   Gerhardt's butt cream   Topical BID   lidocaine   1 patch Transdermal Q24H   mirtazapine   15 mg Oral QHS   sodium chloride  flush  10-40 mL Intracatheter Q12H   torsemide   20 mg Oral Daily   Continuous Infusions:    Diet: Diet Order             DIET SOFT Room service appropriate? Yes with Assist; Fluid consistency: Thin  Diet effective now                 Sick patient will resume Data Reviewed: I have personally reviewed following labs and imaging studies ( see epic result tab) CBC: Recent Labs  Lab 06/14/24 0249 06/15/24 0330 06/16/24 0626 06/17/24 0301 06/18/24 0315  WBC 9.1 5.7 6.2 5.3 6.0  HGB 9.8* 9.2* 10.2* 9.4* 10.0*  HCT 29.6* 27.8* 32.3* 28.6* 30.8*  MCV 98.0 98.6 100.0 96.6 97.8  PLT 123* 106* 126* 137* 165   CMP: Recent Labs  Lab 06/14/24 0249 06/15/24 0330 06/16/24 0626 06/17/24 0301 06/18/24 0315 06/19/24 0343  NA 141 139 137 139 140  --   K 3.3* 3.4* 4.0 2.9* 3.7  --   CL 106 103 103 100 102  --   CO2 28 29 24 30 30   --   GLUCOSE 106* 85 75 95 126*  --   BUN 14 13 15 13 16   --   CREATININE 0.55* 0.63 0.68 0.61 0.58*  --   CALCIUM  8.0* 7.6* 7.8* 7.7* 8.5*  --   MG 1.4* 1.4* 1.8 1.6* 1.8 1.9   GFR: Estimated Creatinine Clearance: 96.6 mL/min (A) (by C-G formula based on SCr of 0.58 mg/dL (L)). No results for input(s): AST, ALT, ALKPHOS, BILITOT, PROT, ALBUMIN in the last 168 hours.  No results for input(s): LIPASE, AMYLASE in the last 168 hours.  No results for input(s): AMMONIA in the last 168 hours.  Coagulation Profile:  No results for input(s): INR, PROTIME in the last 168 hours.  Unresulted Labs (From admission, onward)    None      Antimicrobials/Microbiology: Anti-infectives (From  admission, onward)    Start     Dose/Rate Route Frequency Ordered Stop   06/16/24 1000  amoxicillin  (AMOXIL ) capsule 1,000 mg  Status:  Discontinued        1,000 mg Oral 2 times daily 06/09/24 1406 06/19/24 1428   06/10/24 1000  vancomycin  (VANCOCIN ) capsule 125 mg        125 mg Oral Daily 06/09/24 1406 06/17/24 1124   06/09/24 1600  meropenem  (MERREM ) 1 g in sodium chloride  0.9 % 100 mL IVPB        1 g 200 mL/hr over 30 Minutes Intravenous Every 8 hours 06/09/24 1406 06/16/24 0759   06/07/24 1000  vancomycin  (VANCOCIN ) capsule 125 mg  Status:  Discontinued        125 mg Oral Daily 06/06/24 1432 06/09/24 1406   06/06/24 1000  linezolid  (ZYVOX ) IVPB 600 mg  Status:  Discontinued  600 mg 300 mL/hr over 60 Minutes Intravenous Every 12 hours 06/06/24 0704 06/06/24 1432   06/06/24 0800  ceFEPIme  (MAXIPIME ) 2 g in sodium chloride  0.9 % 100 mL IVPB  Status:  Discontinued        2 g 200 mL/hr over 30 Minutes Intravenous Every 8 hours 06/06/24 0311 06/06/24 0704   06/06/24 0800  meropenem  (MERREM ) 1 g in sodium chloride  0.9 % 100 mL IVPB  Status:  Discontinued        1 g 200 mL/hr over 30 Minutes Intravenous Every 8 hours 06/06/24 0712 06/09/24 1406   06/06/24 0600  vancomycin  (VANCOCIN ) capsule 500 mg  Status:  Discontinued        500 mg Oral Every 6 hours 06/06/24 0310 06/06/24 1432   06/06/24 0400  metroNIDAZOLE  (FLAGYL ) IVPB 500 mg  Status:  Discontinued        500 mg 100 mL/hr over 60 Minutes Intravenous Every 8 hours 06/06/24 0310 06/06/24 0705   06/06/24 0200  vancomycin  (VANCOREADY) IVPB 2000 mg/400 mL  Status:  Discontinued        2,000 mg 100 mL/hr over 240 Minutes Intravenous  Once 06/06/24 0153 06/06/24 0705   06/06/24 0145  ampicillin  (OMNIPEN) 2 g in sodium chloride  0.9 % 100 mL IVPB        2 g 300 mL/hr over 20 Minutes Intravenous  Once 06/06/24 0139 06/06/24 0328   06/06/24 0000  ceFEPIme  (MAXIPIME ) 2 g in sodium chloride  0.9 % 100 mL IVPB        2 g 200 mL/hr over 30  Minutes Intravenous  Once 06/05/24 2358 06/06/24 0118         Component Value Date/Time   SDES BLOOD RIGHT HAND 06/10/2024 1813   SPECREQUEST  06/10/2024 1813    BOTTLES DRAWN AEROBIC AND ANAEROBIC Blood Culture results may not be optimal due to an inadequate volume of blood received in culture bottles   CULT  06/10/2024 1813    NO GROWTH 5 DAYS Performed at [facility], [address] [place], [place] [address]    REPTSTATUS 06/15/2024 FINAL 06/10/2024 1813    Procedures: Mennie LAMY, MD [facility] 06/20/2024, 11:57 AM   "

## 2024-06-21 DIAGNOSIS — A419 Sepsis, unspecified organism: Secondary | ICD-10-CM | POA: Diagnosis not present

## 2024-06-21 DIAGNOSIS — R6521 Severe sepsis with septic shock: Secondary | ICD-10-CM | POA: Diagnosis not present

## 2024-06-21 MED ORDER — OXYCODONE HCL 5 MG PO TABS
5.0000 mg | ORAL_TABLET | Freq: Four times a day (QID) | ORAL | Status: DC
  Administered 2024-06-21 – 2024-06-25 (×16): 5 mg via ORAL
  Filled 2024-06-21 (×17): qty 1

## 2024-06-21 NOTE — TOC Progression Note (Addendum)
 Transition of Care [facility]) - Progression Note    Patient Details  Name: Tim [name] MRN: [id number] Date of Birth: 1941-04-20  Transition of Care [facility]) CM/SW Contact  Robynn Eileen Hoose, RN Phone Number: 06/21/2024, 1:44 PM  Clinical Narrative:   Secure message from palliative care provider regarding family wanting [name] inpatient. Call to [facility] with [facility] [place], she will follow up.  1500: Spoke with Intake coordinator for [employer] in [place], referral received, awaiting confirmation of acceptance.  1605: Phone call from [place] with [facility] received [phone]). She has a phone meeting with wife of patient tomorrow at noon. Attending providers updated on plan of care.     Expected Discharge Plan: Skilled Nursing Facility Barriers to Discharge: Awaiting State Approval (PASRR), Continued Medical Work up               Expected Discharge Plan and Services In-house Referral: Clinical Social Work Discharge Planning Services: CM Consult Post Acute Care Choice: Home Health, Resumption of Svcs/PTA Provider Living arrangements for the past 2 months: Single Family Home                           HH Arranged: RN, PT HH Agency: [facility] Date HH Agency Contacted: 06/10/24 Time HH Agency Contacted: 1455 Representative spoke with at [facility] Agency: [facility]   Social Drivers of Health (SDOH) Interventions SDOH Screenings   Food Insecurity: No Food Insecurity (06/07/2024)  Housing: Low Risk (06/07/2024)  Recent Concern: Housing - High Risk (05/01/2024)   Received from [facility]  Transportation Needs: No Transportation Needs (06/07/2024)  Utilities: Not At Risk (06/07/2024)  Depression (PHQ2-9): Low Risk (03/31/2024)  Financial Resource Strain: Low Risk (09/23/2023)  Physical Activity: Unknown (09/23/2023)  Social Connections: Moderately Isolated (06/07/2024)  Stress: No Stress Concern Present (05/01/2024)   Received from  [facility]  Tobacco Use: Low Risk (06/05/2024)    Readmission Risk Interventions    04/15/2024    3:20 PM 04/14/2024    4:33 PM  Readmission Risk Prevention Plan  Transportation Screening Complete Complete  PCP or Specialist Appt within 5-7 Days Complete Complete  Home Care Screening Complete Complete  Medication Review (RN CM) Complete Referral to Pharmacy

## 2024-06-21 NOTE — Progress Notes (Signed)
 "  Palliative Medicine Inpatient Follow Up Note   HPI:  83 y.o. 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    Patient has history of enterococcal infection of the AICD lead that is being treated with chronic amoxicillin .  Patient recently admitted at [facility] from 10/9-10/17 with mixed hypovolemic/septic shock.  Also noted patient has been admitted to [facility] from 10/29-11/1 with [name] and was treated with vancomycin .  Wife states he has poor oral intake.  On 06/05/2024 overnight patient had high fever.  EMS called found temperature of 103.7.  Patient has chronic Foley in place with hematuria, Foley replaced.  Despite IV fluids remains hypotensive, therefore started on levo drip.  Transferred to ICU services for intensive care management.   Work note that patient has had 2 inpatient admissions in the last 6 months.     Transitioned to comfort-focused care on 06/19/2024  This is hospital day #: 15   Today's Discussion 06/21/2024    I have reviewed medical records including:  EPIC notes: Reviewed TOC progress note from 06/20/2024, patient will be monitored for acute decline, if no further decline, spouse wishes patient to be discharged to home with home hospice. Vital signs: Reviewed from 06/21/2024 at 7:29 AM: Temperature 98.3, blood pressure 125/52 mmHg, pulse rate 85, RR 18, O2 saturation 99% on room air. MAR: Reviewed PRN meds received over the last 24 hours.  Received as needed 10 mg oxycodone  at 1942 last night, oxycodone  5 mg at 0 236, and 1305 yesterday.  Reviewed to assess needs for medication adjustment to optimize comfort.  Available advanced  directives in ACP: Advance directive documents available on file. Labs: Patient on end-of-life care, no labs to be reviewed.  Met with patient/family to discuss diagnosis prognosis, GOC, EOL wishes, disposition and options.  Patient is observed resting comfortably in bed. Not in any form of acute distress. Reports he is doing well, nothing much to tell you.  Alert and oriented x 3, able to make needs known. He reports ongoing intermittent pain and discomfort to his right lower and lateral back, as well as to his sacral area. Report pain is achy and dull, and can score from 6-8 out of 10. Able to engage in a meaningful conversation.    Goals of care were revisited and, for the most part, remain unchanged.  Patient and his wife, [name], remain at peace with the decision to transition to comfort focused care pathway.  They are currently awaiting disposition planning, with consideration for inpatient hospice facility with [facility] versus discharge home with hospice through [facility].  Patient and wife expressed desire for the patient to be reevaluated for inpatient hospice eligibility.  If the patient is not deemed eligible, they are agreeable to home hospice and specifically preferred [facility] hospice care, citing positive prior experience.  Wife shared concerns regarding home hospice logistics, including coordination and setting of necessary DME equipment at home.  She also expressed emotional distress and embarrassment related to the current condition of the home, stating she has not had time to clean.  Reassurance was provided regarding hospice support and coordination of care, including assistance with DME delivery and transition planning from hospital to home.  Emotional support was offered,  and concerns were acknowledged.  Plan is to proceed with hospice evaluation and continue to support family through disposition planning.  Discussed with patient that his AICD has already been  deactivated by [facility] yesterday 06/20/2024.   Discussed that with regards to his ongoing intermittent body pain, our focus is on relieving pain and optimizing comfort, therefore will make oxycodone  5 mg scheduled. Can continue to take dilaudid  for breakthrough pain.   Education offered on the natural trajectory and expectations at end-of-life.  I discussed and provided education today on end-of-life, it is expected that the person may become increasingly sleepy and less responsive, with changes in breathing patterns such as irregular shallow breaths and possible process.  The person relives intervention eating or drinking, and hands and feet may become cold or bluish circulation slows.  Confusion or difficulty recognizing that ones can occur, and skin may show changes in color mottling.  Throughout this process, our focus will be on keeping the patient comfortable and free from pain or distress.  This changes are natural part of the dying process. Patient/family verbalized understanding.   Created space and opportunity for patient to explore thoughts feelings and fears regarding current medical situation.  Patient and her family face treatment option decisions, advanced directive decisions and anticipatory care needs.   Questions and concerns addressed   Palliative Support Provided.   Objective Assessment: Vital Signs Vitals:   06/20/24 1959 06/21/24 0729  BP: (!) 128/57 (!) 125/52  Pulse: 91 85  Resp: 19 18  Temp: 98.9 F (37.2 C) 98.3 F (36.8 C)  SpO2: 97% 99%    Intake/Output Summary (Last 24 hours) at 06/21/2024 0915 Last data filed at 06/21/2024 0530 Gross per 24 hour  Intake --  Output 1250 ml  Net -1250 ml   Last Weight  Most recent update: 06/19/2024  6:51 AM    Weight  118.2 kg (260 lb 9.3 oz)             Gen: Chronically ill, not in distress HEENT: Mucous membranes are moist CV: Regular rate, 85 bpm PULM: Clear, but diminished bilaterally ABD: Soft and  nontender EXT: Edema up to 4 extremities noted Neuro: Alert and oriented x 3  SUMMARY OF RECOMMENDATIONS    # Complex medical decision-making/goals of care CODE STATUS: Continue with DNR comfort Patient to be reevaluated by [facility] [place] for inpatient hospice eligibility Patient's ICD deactivated 06/20/2024 If patient remains stable in next 24 to 48 hours, plan to discharge home with hospice support.  [facility] is a hospice organization/company preferred by wife Comfort cart for the family Unrestricted visitations in the setting of EOL (per policy)  # Symptom management Tylenol /acetaminophen  scheduled ATC to address generalized mild pain Change PRN Oxycodone  5mg  to Oxycodone  5mg  Q6 hours ATC.  Hydromorphone  as needed for pain/air hunger/comfort Robinul  as needed for excessive secretions Ativan  as needed for agitation/anxiety Zofran  as needed for nausea Artificial tears as needed for dry eyes Haldol  as needed for agitation/anxiety May have comfort feeding Oxygen supplement for comfort.  No escalation   # Psychosocial support Provided to patient, and patient's wife [name].  # Discharge planning Home with hospice support  # Treatment plan discussed with: Patient, patient's wife [name], nursing staff, and medical team.  I personally spent a total of 50 minutes in the care of the patient today including preparing to see the patient, getting/reviewing separately obtained history, performing a medically appropriate exam/evaluation, counseling and educating, placing orders, referring and communicating with other health  care professionals, documenting clinical information in the EHR, independently interpreting results, communicating results, and coordinating care.   ______________________________________________________________________________________ Kathlyne Bolder NP-C [facility] Palliative Medicine Team Team Cell Phone: [phone] Please utilize secure chat with additional  questions, if there is no response within 30 minutes please call the above phone number  Palliative Medicine Team providers are available by phone from 7am to 7pm daily and can be reached through the team cell phone.  Should this patient require assistance outside of these hours, please call the patient's attending physician.     "

## 2024-06-21 NOTE — Plan of Care (Signed)
  Problem: Education: Goal: Knowledge of General Education information will improve Description: Including pain rating scale, medication(s)/side effects and non-pharmacologic comfort measures Outcome: Progressing   Problem: Health Behavior/Discharge Planning: Goal: Ability to manage health-related needs will improve Outcome: Progressing   Problem: Clinical Measurements: Goal: Ability to maintain clinical measurements within normal limits will improve Outcome: Progressing Goal: Will remain free from infection Outcome: Progressing Goal: Diagnostic test results will improve Outcome: Progressing Goal: Respiratory complications will improve Outcome: Progressing Goal: Cardiovascular complication will be avoided Outcome: Progressing   Problem: Activity: Goal: Risk for activity intolerance will decrease Outcome: Progressing   Problem: Nutrition: Goal: Adequate nutrition will be maintained Outcome: Progressing   Problem: Coping: Goal: Level of anxiety will decrease Outcome: Progressing   Problem: Elimination: Goal: Will not experience complications related to bowel motility Outcome: Progressing Goal: Will not experience complications related to urinary retention Outcome: Progressing   Problem: Pain Managment: Goal: General experience of comfort will improve and/or be controlled Outcome: Progressing   Problem: Safety: Goal: Ability to remain free from injury will improve Outcome: Progressing   Problem: Skin Integrity: Goal: Risk for impaired skin integrity will decrease Outcome: Progressing   Problem: Education: Goal: Knowledge of General Education information will improve Description: Including pain rating scale, medication(s)/side effects and non-pharmacologic comfort measures Outcome: Progressing   Problem: Health Behavior/Discharge Planning: Goal: Ability to manage health-related needs will improve Outcome: Progressing   Problem: Clinical Measurements: Goal:  Ability to maintain clinical measurements within normal limits will improve Outcome: Progressing Goal: Will remain free from infection Outcome: Progressing Goal: Diagnostic test results will improve Outcome: Progressing Goal: Respiratory complications will improve Outcome: Progressing Goal: Cardiovascular complication will be avoided Outcome: Progressing   Problem: Activity: Goal: Risk for activity intolerance will decrease Outcome: Progressing   Problem: Nutrition: Goal: Adequate nutrition will be maintained Outcome: Progressing   Problem: Coping: Goal: Level of anxiety will decrease Outcome: Progressing   Problem: Elimination: Goal: Will not experience complications related to bowel motility Outcome: Progressing Goal: Will not experience complications related to urinary retention Outcome: Progressing   Problem: Pain Managment: Goal: General experience of comfort will improve and/or be controlled Outcome: Progressing   Problem: Safety: Goal: Ability to remain free from injury will improve Outcome: Progressing   Problem: Skin Integrity: Goal: Risk for impaired skin integrity will decrease Outcome: Progressing   Problem: Education: Goal: Knowledge of the prescribed therapeutic regimen will improve Outcome: Progressing   Problem: Coping: Goal: Ability to identify and develop effective coping behavior will improve Outcome: Progressing   Problem: Clinical Measurements: Goal: Quality of life will improve Outcome: Progressing   Problem: Respiratory: Goal: Verbalizations of increased ease of respirations will increase Outcome: Progressing   Problem: Role Relationship: Goal: Family's ability to cope with current situation will improve Outcome: Progressing Goal: Ability to verbalize concerns, feelings, and thoughts to partner or family member will improve Outcome: Progressing   Problem: Pain Management: Goal: Satisfaction with pain management regimen will  improve Outcome: Progressing

## 2024-06-21 NOTE — Progress Notes (Signed)
" °  Progress Note   Patient: Tim [name] FMW:[id number] DOB: 1941/01/31 DOA: 06/05/2024     15 DOS: the patient was seen and examined on 06/21/2024        Brief hospital course: 83 y.o. M with obesity, CAD, TAVR, history enterococcal prosthetic valve endocarditis, pacemaker infection, history CHF, chronic pain, A-fib, diabetes, COPD who presented with shock, found to have E. coli and Klebsiella bacteremia due to UTI due to chronic indwelling Foley.  Admitted to ICU on Levophed .          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 Transaminitis Depression Class I obesity  Patient was recently admitted to [name] in October for septic shock, and then shortly after that at [facility] with [name].  He had been stable at home for about a month until he became sick again, patient was admitted to the ICU, treated with antibiotics and Levophed , urology exchanged Foley catheter.  Stabilized and transferred to [facility].  Later hospitalization complicated by ileus.  In discussions with palliative care, patient requested transition to comfort measures.    - Home chronic amoxicillin  has been stopped - Home Zetia , finasteride , ICS/LABA, ranolazine , Crestor , amlodipine, Eliquis , carvedilol, and torsemide  losartan  have been stopped - ICD has been turned off -Continue torsemide  for symptomatic relief - Continue mirtazapine  - Ativan  for anxiety, Haldol  for agitation, Dilaudid  for pain - Consult Hospice, disposition  pending       Subjective: Patient has a good appetite today, eating breakfast.  No fever, respiratory distress, has some pain in his leg from how he is sitting.     Physical Exam: BP (!) 114/44 (BP Location: Right Arm)   Pulse 86   Temp 98.8 F (37.1 C) (Oral)   Resp 18   Ht 6' 4 (1.93 m)   Wt 118.2 kg   SpO2 98%   BMI 31.72 kg/m   Elderly adult male, lying in bed, interactive and appropriate RRR, no murmurs, no peripheral edema Respiratory rate normal, lungs clear without rales or wheezes Abdomen soft no tenderness palpation or guarding, no ascites or distention Oriented, tired, severe generalized weakness    Data Reviewed: Discussed with palliative care    Family Communication: Wife    Disposition: Status is: Inpatient Notified today that the patient will not qualify for inpatient hospice.  Family and palliative care are exploring alternative options.        Author: Lonni SHAUNNA Dalton, MD 06/21/2024 5:18 PM  For on call review [email or web address].    "

## 2024-06-22 DIAGNOSIS — R6521 Severe sepsis with septic shock: Secondary | ICD-10-CM | POA: Diagnosis not present

## 2024-06-22 DIAGNOSIS — A419 Sepsis, unspecified organism: Secondary | ICD-10-CM | POA: Diagnosis not present

## 2024-06-22 NOTE — Plan of Care (Signed)
  Problem: Education: Goal: Knowledge of General Education information will improve Description: Including pain rating scale, medication(s)/side effects and non-pharmacologic comfort measures Outcome: Progressing   Problem: Health Behavior/Discharge Planning: Goal: Ability to manage health-related needs will improve Outcome: Progressing   Problem: Clinical Measurements: Goal: Ability to maintain clinical measurements within normal limits will improve Outcome: Progressing Goal: Will remain free from infection Outcome: Progressing Goal: Diagnostic test results will improve Outcome: Progressing Goal: Respiratory complications will improve Outcome: Progressing Goal: Cardiovascular complication will be avoided Outcome: Progressing   Problem: Activity: Goal: Risk for activity intolerance will decrease Outcome: Progressing   Problem: Nutrition: Goal: Adequate nutrition will be maintained Outcome: Progressing   Problem: Coping: Goal: Level of anxiety will decrease Outcome: Progressing   Problem: Elimination: Goal: Will not experience complications related to bowel motility Outcome: Progressing Goal: Will not experience complications related to urinary retention Outcome: Progressing   Problem: Pain Managment: Goal: General experience of comfort will improve and/or be controlled Outcome: Progressing   Problem: Safety: Goal: Ability to remain free from injury will improve Outcome: Progressing   Problem: Skin Integrity: Goal: Risk for impaired skin integrity will decrease Outcome: Progressing   Problem: Education: Goal: Knowledge of General Education information will improve Description: Including pain rating scale, medication(s)/side effects and non-pharmacologic comfort measures Outcome: Progressing   Problem: Health Behavior/Discharge Planning: Goal: Ability to manage health-related needs will improve Outcome: Progressing   Problem: Clinical Measurements: Goal:  Ability to maintain clinical measurements within normal limits will improve Outcome: Progressing Goal: Will remain free from infection Outcome: Progressing Goal: Diagnostic test results will improve Outcome: Progressing Goal: Respiratory complications will improve Outcome: Progressing Goal: Cardiovascular complication will be avoided Outcome: Progressing   Problem: Activity: Goal: Risk for activity intolerance will decrease Outcome: Progressing   Problem: Nutrition: Goal: Adequate nutrition will be maintained Outcome: Progressing   Problem: Coping: Goal: Level of anxiety will decrease Outcome: Progressing   Problem: Elimination: Goal: Will not experience complications related to bowel motility Outcome: Progressing Goal: Will not experience complications related to urinary retention Outcome: Progressing   Problem: Pain Managment: Goal: General experience of comfort will improve and/or be controlled Outcome: Progressing   Problem: Safety: Goal: Ability to remain free from injury will improve Outcome: Progressing   Problem: Skin Integrity: Goal: Risk for impaired skin integrity will decrease Outcome: Progressing   Problem: Education: Goal: Knowledge of the prescribed therapeutic regimen will improve Outcome: Progressing   Problem: Coping: Goal: Ability to identify and develop effective coping behavior will improve Outcome: Progressing   Problem: Clinical Measurements: Goal: Quality of life will improve Outcome: Progressing   Problem: Respiratory: Goal: Verbalizations of increased ease of respirations will increase Outcome: Progressing   Problem: Role Relationship: Goal: Family's ability to cope with current situation will improve Outcome: Progressing Goal: Ability to verbalize concerns, feelings, and thoughts to partner or family member will improve Outcome: Progressing   Problem: Pain Management: Goal: Satisfaction with pain management regimen will  improve Outcome: Progressing

## 2024-06-22 NOTE — TOC Progression Note (Signed)
 Transition of Care [facility]) - Progression Note    Patient Details  Name: Tim [name] MRN: [id number] Date of Birth: 1940-07-11  Transition of Care [facility]) CM/SW Contact  Marval Gell, RN Phone Number: 06/22/2024, 12:51 PM  Clinical Narrative:     Received call from [facility]. They are able to accept patient and are working on setting up DME by 1pm tomorrow. Rep from [facility] is going to call [facility], in the AM to set up a time for transport.    Expected Discharge Plan: Skilled Nursing Facility Barriers to Discharge: Awaiting State Approval (PASRR), Continued Medical Work up               Expected Discharge Plan and Services In-house Referral: Clinical Social Work Discharge Planning Services: CM Consult Post Acute Care Choice: Home Health, Resumption of Svcs/PTA Provider Living arrangements for the past 2 months: Single Family Home                           HH Arranged: RN, PT HH [facility] Date HH Agency Contacted: 06/10/24 Time HH Agency Contacted: 1455 Representative spoke with at [facility] Agency: [facility]   Social Drivers of Health (SDOH) Interventions SDOH Screenings   Food Insecurity: No Food Insecurity (06/07/2024)  Housing: Low Risk (06/07/2024)  Recent Concern: Housing - High Risk (05/01/2024)   Received from [facility]  Transportation Needs: No Transportation Needs (06/07/2024)  Utilities: Not At Risk (06/07/2024)  Depression (PHQ2-9): Low Risk (03/31/2024)  Financial Resource Strain: Low Risk (09/23/2023)  Physical Activity: Unknown (09/23/2023)  Social Connections: Moderately Isolated (06/07/2024)  Stress: No Stress Concern Present (05/01/2024)   Received from [facility]  Tobacco Use: Low Risk (06/05/2024)    Readmission Risk Interventions    04/15/2024    3:20 PM 04/14/2024    4:33 PM  Readmission Risk Prevention Plan  Transportation Screening Complete Complete  PCP or Specialist Appt within 5-7 Days Complete  Complete  Home Care Screening Complete Complete  Medication Review (RN CM) Complete Referral to Pharmacy

## 2024-06-22 NOTE — Progress Notes (Signed)
 [facility]  Tim [name] FMW:[id number]  DOB: 01-05-1941  DOA: 06/05/2024  PCP: [name], MD  06/22/2024,7:16 AM  LOS: 16 days    Code Status: DNR comfort care     from: Home   83 y.o. M with obesity, CAD + PCI, TAVR, history enterococcal prosthetic valve endocarditis, pacemaker infection follows with Dr. Cornelia Balder EP, history CHF, chronic pain, A-fib, diabetes, COPD and BPH who presented with shock, found to have E. coli and Klebsiella bacteremia due to UTI due to chronic indwelling Foley.  Admitted to ICU  12/5 on Levophed ---> Urology, hospitalist, psychiatry all were consulted-patient was transferred 12/10 to the hospitalist service he was treated for septic shock---had a Three-way Foley catheter for hematuria which was downsized-blood complications of possible ileus versus gastroparesis-cardiology was involved in some of the discussions with regards to planning for his  defibrillator battery replacement was supposed to have been transferred from meropenem  to augmentin -- 12/18 patient decided on comfort measures  currently we await  discussion with [facility] with regards to planning for end-of-life care caseworker is trying to help with the same     Assessment and plan: Septic shock due to Klebsiella pneumoniae and E coli bacteremia due to UTI 2 chronic indwelling Foley 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 Transaminitis Depression Class I obesity  Patient was recently admitted to [facility] in October for septic shock, and then shortly after that at [name] with [name].   Dispo/Global plan:  hospice via discussion with social  work     Subjective:    weight coherent pleasant no distress looks well feels fair no fever no chills -- just fine she Occasional use with left lower extremity discomfort   Objective + exam Vitals:   06/20/24 0821 06/20/24 1959 06/21/24 0729 06/21/24 1642  BP: 132/61 (!) 128/57 (!) 125/52 (!) 114/44  Pulse: 91 91 85 86  Resp: 18 19 18 18   Temp: 98.4 F (36.9 C) 98.9 F (37.2 C) 98.3 F (36.8 C) 98.8 F (37.1 C)  TempSrc: Oral Oral Oral Oral  SpO2: 92% 97% 99% 98%  Weight:      Height:       Filed Weights   06/14/24 0245 06/15/24 0500 06/19/24 0500  Weight: 113.9 kg 118.2 kg 118.2 kg     Examination:  EOMI NCAT thick neck Mallampati 4 no icterus no pallor chest clear no wheeze rales rhonchi Power 5/5 ROM intact moving 4 limbs equally no focal deficit S1-S2 perm pacemaker in place.  Scheduled Meds:  Chlorhexidine  Gluconate Cloth  6 each Topical Daily   collagenase   1 Application Topical Daily   famotidine   20 mg Oral BID   feeding supplement  237 mL Oral TID BM   gabapentin   100 mg Oral TID   Gerhardt's butt cream   Topical BID   lidocaine   1 patch Transdermal Q24H   mirtazapine   15 mg Oral QHS   oxyCODONE   5 mg Oral Q6H   sodium chloride  flush  10-40 mL Intracatheter Q12H   torsemide   20 mg Oral Daily   Continuous Infusions: acetaminophen  **OR** acetaminophen , antiseptic oral rinse, artificial tears, diclofenac  Sodium, docusate sodium , glycopyrrolate  **OR** glycopyrrolate  **OR** glycopyrrolate , haloperidol  **OR** haloperidol  **OR** haloperidol  lactate, HYDROmorphone  (DILAUDID ) injection, ipratropium-albuterol ,  LORazepam  **OR** LORazepam  **OR** LORazepam , ondansetron  (ZOFRAN ) IV, mouth rinse, oxyCODONE , polyethylene glycol, sodium chloride  flush  Time  44  Jai-Gurmukh Isaly Fasching, MD  [facility]

## 2024-06-22 NOTE — Progress Notes (Signed)
 "  Palliative Medicine Inpatient Follow Up Note   HPI:  83 y.o. 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    Tim [name] has history of enterococcal infection of the AICD lead that is being treated with chronic amoxicillin .  Tim [name] recently admitted at [facility] from 10/9-10/17 with mixed hypovolemic/septic shock.  Also noted Tim [name] has been admitted to [facility] from 10/29-11/1 with [name] and was treated with vancomycin .  Wife states he has poor oral intake.  On 06/05/2024 overnight Tim [name] had high fever.  EMS called found temperature of 103.7.  Tim [name] has chronic Foley in place with hematuria, Foley replaced.  Despite IV fluids remains hypotensive, therefore started on levo drip.  Transferred to ICU services for intensive care management.   Work note that Tim [name] has had 2 inpatient admissions in the last 6 months.     Transitioned to comfort-focused care on 06/19/2024  This is hospital day #: 25  Today's Discussion 06/22/2024  I have reviewed medical records including:  EPIC notes: Reviewed progress/consult notes from [facility] 06/21/2024 detailing treatment plan. Reviewed TOC note from 06/21/2024: Tim [name] not currently eligible for inpatient hospice per [facility] [place].  Wife to have meeting with [facility] liaison today at 12 PM for hospice home planning.   Vital signs: From 06/22/2024 at 0835: T 98.3 F, BP 127/73, HR 103 BPM, RR 19 BPM, O2 98% RA 200 MAR: Reviewed PRN meds received over the last 24 hours. Received Lorazepam  1mg  on 06/21/2024 at 2337. Reviewed to assess needs for medication adjustment to optimize comfort.  Available advanced directives  in ACP: Available on file Labs: No recent labs to be reviewed  Met with Tim [name]/family to discuss diagnosis prognosis, GOC, EOL wishes, disposition and options.  Tim [name] is observed resting comfortably in bed. Not in any form of acute distress. No signs or gestures indicating pain or discomfort. Alert and oriented x 3, able to make needs known. Able to engage in a meaningful conversation.  Reports body pain is now well controlled since changing the oxycodone  from PRN to scheduled.  I discussed with Tim [name] and his wife that Tim [name] is not eligible for inpatient hospice per most recent eligibility assessment done by [facility]. Tim [name] and wife wanting to pursue home with hospice through [facility].   Later in the day, I reviewed TOC note, stating that [facility] is able to accept Tim [name], and are working on setting up DME by 1 PM tomorrow.  Goals of care unchanged. Tim [name] is to be discharged to home with outpatient palliative services through [facility], working on setting up DME by 1PM tomorrow.   Education offered on the natural trajectory and expectations at end-of-life.  Created space and opportunity for Tim [name] to explore thoughts feelings and fears regarding current medical situation.  Tim [name] and her family face treatment option decisions, advanced directive decisions and anticipatory care needs.   Questions and concerns addressed   Palliative Support Provided.   Objective Assessment: Vital Signs Vitals:   06/21/24 1642 06/22/24 0835  BP: (!) 114/44 127/73  Pulse: 86 (!) 103  Resp: 18 19  Temp: 98.8 F (37.1 C) 98.3 F (36.8 C)  SpO2: 98% 98%    Intake/Output Summary (  Last 24 hours) at 06/22/2024 9157 Last data filed at 06/22/2024 0542 Gross per 24 hour  Intake 120 ml  Output 2175 ml  Net -2055 ml   Last Weight  Most recent update: 06/19/2024  6:51 AM    Weight  118.2 kg (260 lb 9.3 oz)              Gen: Chronically ill-appearing, not in respiratory distress HEENT: Mucous membranes are moist CV: Regular rate and rhythm PULM: Clear to auscultation bilaterally ABD: Soft and nontender EXT: Noted to all 4 extremities Neuro: Alert and oriented x 3  SUMMARY OF RECOMMENDATIONS    # Complex medical decision-making/goals of care CODE STATUS: Maintain DNR comfort Tim [name] was reevaluated by [facility] [place] yesterday, at that time, Tim [name] was not eligible for inpatient hospice. Coordinated with TOC (06/21/2024) to assist with [facility] care for home hospice request.  Per note, wife will have a meeting today at noon time with [facility] liaison. UPDATE: Note from [facility] reflects [facility] able to accept Tim [name] for home hospice. Home DMEs to be set-up tomorrow at [facility] 06/23/2024.  Continue to provide psychosocial and emotional support to Tim [name] and family  palliative medicine team will continue to follow Comfort cart for the family Unrestricted visitations in the setting of end-of-life care (per policy)  # Symptom management Acetaminophen  as needed for mild pain/discomfort Oxycodone  5 mg scheduled ATC for pain and discomfort Dilaudid  as needed for pain/air hunger/comfort Robinul  as needed for excessive secretions Ativan  as needed for agitation/anxiety Haldol  as needed for agitation/anxiety May have comfort feeding Oxygen as needed for comfort.  No escalation  # Psychosocial support Provided to Tim [name], and Tim [name]'s wife, [name]  # Discharge planning Home with hospice with [facility].  # Treatment plan discussed with:  Tim [name], Tim [name]'s wife [name], nursing staff, and the medical team.  I personally spent a total of 50 minutes in the care of the Tim [name] today including preparing to see the Tim [name], getting/reviewing separately obtained history, performing a medically appropriate exam/evaluation, counseling and educating, referring and communicating with  other health care professionals, documenting clinical information in the EHR, independently interpreting results, communicating results, and coordinating care.   ______________________________________________________________________________________ Kathlyne Bolder NP-C [facility] Team Cell Phone: [phone] Please utilize secure chat with additional questions, if there is no response within 30 minutes please call the above phone number  Palliative Medicine Team providers are available by phone from 7am to 7pm daily and can be reached through the team cell phone.  Should this Tim [name] require assistance outside of these hours, please call the Tim [name]'s attending physician.     "

## 2024-06-23 DIAGNOSIS — A419 Sepsis, unspecified organism: Secondary | ICD-10-CM | POA: Diagnosis not present

## 2024-06-23 DIAGNOSIS — R6521 Severe sepsis with septic shock: Secondary | ICD-10-CM | POA: Diagnosis not present

## 2024-06-23 DIAGNOSIS — E440 Moderate protein-calorie malnutrition: Secondary | ICD-10-CM

## 2024-06-23 MED ORDER — LORAZEPAM 2 MG/ML PO CONC: 1.0000 mg | ORAL | 0 refills | Status: AC | PRN

## 2024-06-23 MED ORDER — MIRTAZAPINE 15 MG PO TABS: 15.0000 mg | ORAL_TABLET | Freq: Every day | ORAL | 0 refills | Status: AC

## 2024-06-23 MED ORDER — OXYCODONE HCL 20 MG PO TABS: 1.0000 | ORAL_TABLET | Freq: Four times a day (QID) | ORAL | 0 refills | Status: AC | PRN

## 2024-06-23 MED ORDER — TORSEMIDE 20 MG PO TABS: 20.0000 mg | ORAL_TABLET | Freq: Every day | ORAL | Status: AC

## 2024-06-23 MED ORDER — GABAPENTIN 100 MG PO CAPS: 100.0000 mg | ORAL_CAPSULE | Freq: Three times a day (TID) | ORAL | 0 refills | Status: AC

## 2024-06-23 MED ORDER — HALOPERIDOL LACTATE 2 MG/ML PO CONC: 0.6000 mg | ORAL | 0 refills | Status: AC | PRN

## 2024-06-23 NOTE — Discharge Summary (Signed)
 Physician Discharge Summary  Tim [name] FMW:[id number] DOB: Aug 24, 1940 DOA: 06/05/2024  PCP: [name], MD  Admit date: 06/05/2024 Discharge date: 06/23/2024  Time spent: 35 minutes  Recommendations for Outpatient Follow-up:  Patient discharging home with care of [facility]  Discharge Diagnoses:  MAIN problem for hospitalization   Septic shock POA 2/2 Klebsiella E. coli bacteremia--now hospice trajectory  Please see below for itemized issues addressed in [facility]ps[facility]- refer to other progress notes for clarity if needed  Discharge Condition: Guarded  Diet recommendation: Regular  Filed Weights   06/14/24 0245 06/15/24 0500 06/19/24 0500  Weight: 113.9 kg 118.2 kg 118.2 kg    History of present illness:  83 y.o. M with obesity, CAD + PCI, TAVR, history enterococcal prosthetic valve endocarditis, pacemaker infection follows with Dr. Cornelia Balder EP, history CHF, chronic pain, A-fib, diabetes, COPD and BPH who presented with shock, found to have E. coli and Klebsiella bacteremia due to UTI due to chronic indwelling Foley.  Admitted to ICU  12/5 on Levophed ---> Urology, hospitalist, psychiatry all were consulted-patient was transferred 12/10 to the hospitalist service he was treated for septic shock---had a Three-way Foley catheter for hematuria which was downsized-blood complications of possible ileus versus gastroparesis-cardiology was involved in some of the discussions with regards to planning for his  defibrillator battery replacement was supposed to have been transferred from meropenem  to augmentin -- 12/18 patient decided on comfort measures  currently we await  discussion with [facility] with regards to planning for end-of-life care caseworker is trying to help with the same      Assessment and plan: Septic shock due to Klebsiella pneumoniae and E coli bacteremia due to UTI 2 chronic indwelling Foley 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 Transaminitis Depression Class I obesity   Patient was ultimately seen by palliative care and decision was made 12/19 to deactivate ICD-patient wanted to go home with hospice support under the care of [facility]-meds were changed to reflect prescriptions were given at discharge for the same  Patient and family were appreciative of care input and delineation of goals during hospitalization and patient stabilized during hospital stay-I have asked that [facility] come and evaluate and discussed with the family support system at home as the wife seems overwhelmed.   Discharge Exam: Vitals:   06/22/24 0835 06/22/24 2024  BP: 127/73 137/84  Pulse: (!) 103 84  Resp: 19 20  Temp: 98.3 F (36.8 C) 98.5 F (36.9 C)  SpO2: 98% 95%    Subj on day of d/c   Coherent alert S1-S2 no murmur Abdomen soft Foley in place Wounds not examined on lower extremities  Discharge Instructions    Allergies as of 06/23/2024       Reactions   Brimonidine Tartrate Shortness Of Breath   Alphagan-Shortness of breath   Brinzolamide Shortness Of Breath   AZOPT- Shortness of breath   Latanoprost Shortness Of Breath   XALATAN- Shortness of breath   Nucynta  [tapentadol ] Shortness Of Breath   Sulfa Antibiotics Palpitations   Timolol Maleate Shortness Of Breath, Other (See Comments)   TIMOPTIC- Aggravated asthma   Diltiazem Swelling    leg swelling   Rofecoxib Swelling    VIOXX- leg swelling   Vancomycin  Rash  Tolerates oral vanc   Codeine Other (See Comments)   Childhood reaction   Tamsulosin Other (See Comments)   Dizziness    Celecoxib Other (See Comments)    CELLBREX-confusion   Colchicine Diarrhea   diarrhea   Tape Rash        Medication List     STOP taking these medications    allopurinol  300 MG tablet Commonly known as: ZYLOPRIM    amLODipine 5 MG tablet Commonly known as: NORVASC   amoxicillin  500 MG capsule Commonly known as: AMOXIL    apixaban  5 MG Tabs tablet Commonly known as: ELIQUIS    Aspirin  Low Dose 81 MG tablet Generic drug: aspirin  EC   B-complex with vitamin C  tablet   carvedilol 3.125 MG tablet Commonly known as: COREG   clotrimazole -betamethasone  cream Commonly known as: LOTRISONE    docusate sodium  100 MG capsule Commonly known as: COLACE   ezetimibe  10 MG tablet Commonly known as: ZETIA    ferrous sulfate 325 (65 FE) MG tablet   FIBER PO   fluticasone  50 MCG/ACT nasal spray Commonly known as: FLONASE    fluticasone -salmeterol 250-50 MCG/ACT Aepb Commonly known as: Wixela Inhub   gabapentin  800 MG tablet Commonly known as: NEURONTIN  Replaced by: gabapentin  100 MG capsule   losartan  25 MG tablet Commonly known as: COZAAR    MULTIVITAMIN ADULT PO   naloxone  4 MG/0.1ML Liqd nasal spray kit Commonly known as: NARCAN    niacin  1000 MG CR tablet Commonly known as: NIASPAN    pantoprazole  40 MG tablet Commonly known as: PROTONIX    potassium chloride  SA 20 MEQ tablet Commonly known as: KLOR-CON  M   ranolazine  500 MG 12 hr tablet Commonly known as: RANEXA    rosuvastatin  20 MG tablet Commonly known as: CRESTOR    VITAMIN D (CHOLECALCIFEROL) PO       TAKE these medications    albuterol  108 (90 Base) MCG/ACT inhaler Commonly known as: VENTOLIN  HFA TAKE 2 PUFFS BY MOUTH EVERY 6 HOURS AS NEEDED FOR WHEEZE OR SHORTNESS OF BREATH   finasteride  5 MG tablet Commonly known as: PROSCAR  Take 1 tablet (5 mg total) by mouth daily.   gabapentin  100 MG capsule Commonly known as: NEURONTIN  Take 1 capsule (100 mg total) by mouth 3 (three) times daily. Replaces: gabapentin  800 MG tablet    haloperidol  2 MG/ML solution Commonly known as: HALDOL  Place 0.3 mLs (0.6 mg total) under the tongue every 4 (four) hours as needed for agitation (or delirium).   loratadine 10 MG tablet Commonly known as: CLARITIN Take 10 mg by mouth daily.   LORazepam  2 MG/ML concentrated solution Commonly known as: ATIVAN  Place 0.5 mLs (1 mg total) under the tongue every 4 (four) hours as needed for anxiety.   mirtazapine  15 MG tablet Commonly known as: REMERON  Take 1 tablet (15 mg total) by mouth at bedtime.   nitroGLYCERIN  0.4 MG SL tablet Commonly known as: NITROSTAT  Place 0.4 mg under the tongue every 5 (five) minutes as needed for chest pain.   nystatin  powder Commonly known as: MYCOSTATIN /NYSTOP  Apply topically 2 (two) times daily.   Oxycodone  HCl 20 MG Tabs Take 1 tablet (20 mg total) by mouth every 6 (six) hours as needed. 1 tab po bid prn severe pain What changed:  how much to take how to take this when to take this reasons to take this   torsemide  20 MG tablet Commonly known as: DEMADEX  Take 1 tablet (20 mg total) by mouth daily as needed (weight gain or swelling). What changed: Another medication with the same  name was added. Make sure you understand how and when to take each.   torsemide  20 MG tablet Commonly known as: DEMADEX  Take 1 tablet (20 mg total) by mouth daily. What changed: You were already taking a medication with the same name, and this prescription was added. Make sure you understand how and when to take each.       Allergies[1]    The results of significant diagnostics from this hospitalization (including imaging, microbiology, ancillary and laboratory) are listed below for reference.    Significant Diagnostic Studies: DG Abd 1 View Result Date: 06/14/2024 EXAM: 1 VIEW XRAY OF THE ABDOMEN 06/14/2024 11:56:00 AM COMPARISON: 06/13/2024 CLINICAL HISTORY: Ileus (HCC) FINDINGS: LINES, TUBES AND DEVICES: Partially visualized spinal cord stimulator noted.  Status post TAVR (Transcatheter Aortic Valve Replacement). BOWEL: Mild gaseous distension of the transverse colon, decreased from prior. SOFT TISSUES: Cholecystectomy clips noted. No abnormal calcifications. BONES: No acute fracture. IMPRESSION: 1. Mild gaseous distension of the transverse colon, decreased from prior. Electronically signed by: Pinkie Pebbles MD 06/14/2024 05:58 PM EST RP Workstation: [id number]   DG Abd 1 View Result Date: 06/13/2024 CLINICAL DATA:  Nausea and vomiting. EXAM: ABDOMEN - 1 VIEW COMPARISON:  None Available. FINDINGS: Gaseous distension of the stomach is seen. Mild gaseous distension of transverse colon also noted. No dilated small bowel loops seen. Considerations include ileus and gastroparesis. Neurostimulator device seen overlying the a right abdomen. Prior cholecystectomy clips also noted. IMPRESSION: Gaseous distension of the stomach and transverse colon. Considerations include ileus and gastroparesis. Electronically Signed   By: Norleen DELENA Kil M.D.   On: 06/13/2024 11:57   US  EKG SITE RITE Result Date: 06/10/2024 If Site Rite image not attached, placement could not be confirmed due to current cardiac rhythm.  CT CHEST ABDOMEN PELVIS WO CONTRAST Result Date: 06/06/2024 CLINICAL DATA:  Sepsis, altered level of consciousness EXAM: CT CHEST, ABDOMEN AND PELVIS WITHOUT CONTRAST TECHNIQUE: Multidetector CT imaging of the chest, abdomen and pelvis was performed following the standard protocol without IV contrast. RADIATION DOSE REDUCTION: This exam was performed according to the departmental dose-optimization program which includes automated exposure control, adjustment of the mA and/or kV according to patient size and/or use of iterative reconstruction technique. COMPARISON:  04/17/2024, 06/06/2024 FINDINGS: CT CHEST FINDINGS Cardiovascular: Unenhanced imaging of the heart is unremarkable without pericardial effusion. Multi lead cardiac pacer and aortic valve prosthesis are  noted. Normal caliber of the thoracic aorta. Atherosclerosis of the aorta and coronary vasculature. Assessment of the vascular lumen cannot be performed without intravenous contrast. Mediastinum/Nodes: Nonspecific segmental air esophagram. Thyroid  and trachea are unremarkable. No pathologic adenopathy. Lungs/Pleura: Trace free-flowing bilateral pleural effusions with minimal dependent lower lobe atelectasis. No acute airspace disease. No pneumothorax. The central airways are patent. Musculoskeletal: No acute or destructive bony abnormalities. Spinal cord stimulator lead within the midthoracic central canal. Reconstructed images demonstrate no additional findings. CT ABDOMEN PELVIS FINDINGS Hepatobiliary: Cholecystectomy. Unremarkable unenhanced appearance to the liver. Stable expected postsurgical dilatation of the common bile duct. Pancreas: Unremarkable unenhanced appearance. Spleen: Unremarkable unenhanced appearance. Adrenals/Urinary Tract: No urinary tract calculi or obstructive uropathy within either kidney. The adrenals are unremarkable. Bladder is decompressed with a Foley catheter, limiting its evaluation. Stomach/Bowel: No bowel obstruction or ileus. Normal retrocecal appendix. Scattered sigmoid diverticulosis. Mild wall thickening of the distal rectosigmoid colon may reflect inflammatory or infectious colitis versus diverticulitis. Vascular/Lymphatic: Aortic atherosclerosis. Left common femoral venous and arterial catheters. No pathologic adenopathy. Reproductive: Prostate is unremarkable. Other: No free fluid or free intraperitoneal gas.  No abdominal wall hernia. Musculoskeletal: No acute or destructive bony abnormalities. Diffuse osteopenia. Reconstructed images demonstrate no additional findings. IMPRESSION: 1. Mild wall thickening of the distal rectosigmoid colon, favor inflammatory or infectious colitis over diverticulitis. 2. Trace bilateral pleural effusions with minimal dependent lower lobe  atelectasis. 3. Aortic Atherosclerosis (ICD10-I70.0). Coronary artery atherosclerosis. Electronically Signed   By: Ozell Daring M.D.   On: 06/06/2024 14:38   CT HEAD WO CONTRAST ( ) Result Date: 06/06/2024 CLINICAL DATA:  Mental status change, persistent or worsening. EXAM: CT HEAD WITHOUT CONTRAST TECHNIQUE: Contiguous axial images were obtained from the base of the skull through the vertex without intravenous contrast. RADIATION DOSE REDUCTION: This exam was performed according to the departmental dose-optimization program which includes automated exposure control, adjustment of the mA and/or kV according to patient size and/or use of iterative reconstruction technique. COMPARISON:  Head CT 04/10/2024 FINDINGS: Brain: No intracranial hemorrhage, mass effect, or midline shift. Stable brain volume. No hydrocephalus. The basilar cisterns are patent. 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 No evidence of territorial infarct or acute ischemia. No extra-axial or intracranial fluid collection. Vascular: Atherosclerosis of skullbase vasculature without hyperdense vessel or abnormal calcification. Skull: No fracture or focal lesion. Sinuses/Orbits: No acute findings. Postoperative change in the left orbit. Minor mucosal thickening of ethmoid air cells. No mastoid effusion. Other: None. IMPRESSION: 1. No acute intracranial abnormality. 2. Remote right cerebellar infarct. Electronically Signed   By: Andrea Gasman M.D.   On: 06/06/2024 14:33   VAS US  LOWER EXTREMITY VENOUS (DVT) Result Date: 06/06/2024  Lower Venous DVT Study Patient Name:  PAXTON BINNS  Date of Exam:   06/06/2024 Medical Rec #: [id number]        Accession #:    [id number] Date of Birth: [birth year]        Patient Gender: M Patient Age:   83 years Exam Location:  [facility] Procedure:      VAS US  LOWER EXTREMITY VENOUS (DVT) Referring Phys: NORLEEN PAYNE --------------------------------------------------------------------------------   Indications: Swelling, and Edema.  Comparison Study: Previous study of the left lower extremity on 10.13.2025. Performing Technologist: Edilia Elden Appl  Examination Guidelines: A complete evaluation includes B-mode imaging, spectral Doppler, color Doppler, and power Doppler as needed of all accessible portions of each vessel. Bilateral testing is considered an integral part of a complete examination. Limited examinations for reoccurring indications may be performed as noted. The reflux portion of the exam is performed with the patient in reverse Trendelenburg.  +---------+---------------+---------+-----------+----------+--------------+ RIGHT    CompressibilityPhasicitySpontaneityPropertiesThrombus Aging +---------+---------------+---------+-----------+----------+--------------+ CFV      Full           Yes      Yes                                 +---------+---------------+---------+-----------+----------+--------------+ SFJ      Full           Yes      Yes                                 +---------+---------------+---------+-----------+----------+--------------+ FV Prox  Full                                                        +---------+---------------+---------+-----------+----------+--------------+  FV Mid   Full                                                        +---------+---------------+---------+-----------+----------+--------------+ FV DistalFull                                                        +---------+---------------+---------+-----------+----------+--------------+ PFV      Full                                                        +---------+---------------+---------+-----------+----------+--------------+ POP      Full           Yes      Yes                                 +---------+---------------+---------+-----------+----------+--------------+ PTV      Full                                                         +---------+---------------+---------+-----------+----------+--------------+ PERO     Full                                                        +---------+---------------+---------+-----------+----------+--------------+   +---------+---------------+---------+-----------+----------+-------------------+ LEFT     CompressibilityPhasicitySpontaneityPropertiesThrombus Aging      +---------+---------------+---------+-----------+----------+-------------------+ CFV                                                   Not well visualized +---------+---------------+---------+-----------+----------+-------------------+ SFJ                                                   Not well visualized +---------+---------------+---------+-----------+----------+-------------------+ FV Prox  Full           Yes      Yes                                      +---------+---------------+---------+-----------+----------+-------------------+ FV Mid   Full                                                             +---------+---------------+---------+-----------+----------+-------------------+  FV DistalFull                                                             +---------+---------------+---------+-----------+----------+-------------------+ PFV      Full                                                             +---------+---------------+---------+-----------+----------+-------------------+ POP      Full           Yes      Yes                                      +---------+---------------+---------+-----------+----------+-------------------+ PTV      Full                                                             +---------+---------------+---------+-----------+----------+-------------------+ PERO     Full                                                             +---------+---------------+---------+-----------+----------+-------------------+ Left common  femoral vein and great saphenous vein at the saphenofemoral junction were not visualized due to CVC bandages.     Summary: RIGHT: - There is no evidence of deep vein thrombosis in the lower extremity.  - No cystic structure found in the popliteal fossa.  LEFT: - There is no evidence of deep vein thrombosis in the lower extremity. However, portions of this examination were limited- see technologist comments above.  - No cystic structure found in the popliteal fossa.  *See table(s) above for measurements and observations. Electronically signed by Debby Robertson on 06/06/2024 at 2:13:21 PM.    Final    DG Chest Port 1 View Result Date: 06/06/2024 EXAM: 1 VIEW(S) XRAY OF THE CHEST 06/06/2024 12:55:00 AM COMPARISON: Comparison with 04/10/2024. CLINICAL HISTORY: Questionable sepsis - evaluate for abnormality. FINDINGS: LINES, TUBES AND DEVICES: TAVR. Left chest wall icd. Spine stimulator leads project over the thoracic spine. LUNGS AND PLEURA: The right apex is obscured. No focal pulmonary opacity. No pleural effusion. No pneumothorax. HEART AND MEDIASTINUM: Stable cardiomediastinal silhouette given the patient rotation. BONES AND SOFT TISSUES: No acute osseous abnormality. IMPRESSION: 1. No acute cardiopulmonary abnormality identified. Electronically signed by: Norman Gatlin MD 06/06/2024 01:00 AM EST RP Workstation: [id number]    Microbiology: No results found for this or any previous visit (from the past 240 hours).   Labs: Basic Metabolic Panel: Recent Labs  Lab 06/17/24 0301 06/18/24 0315 06/19/24 0343  NA 139 140  --   K 2.9* 3.7  --   CL 100 102  --   CO2 30 30  --  GLUCOSE 95 126*  --   BUN 13 16  --   CREATININE 0.61 0.58*  --   CALCIUM  7.7* 8.5*  --   MG 1.6* 1.8 1.9   Liver Function Tests: No results for input(s): AST, ALT, ALKPHOS, BILITOT, PROT, ALBUMIN in the last 168 hours. No results for input(s): LIPASE, AMYLASE in the last 168 hours. No results for input(s):  AMMONIA in the last 168 hours. CBC: Recent Labs  Lab 06/17/24 0301 06/18/24 0315  WBC 5.3 6.0  HGB 9.4* 10.0*  HCT 28.6* 30.8*  MCV 96.6 97.8  PLT 137* 165   Cardiac Enzymes: No results for input(s): CKTOTAL, CKMB, CKMBINDEX, TROPONINI in the last 168 hours. BNP: BNP (last 3 results) Recent Labs    04/10/24 0548  BNP 60.6    ProBNP (last 3 results) No results for input(s): PROBNP in the last 8760 hours.  CBG: Recent Labs  Lab 06/18/24 2102 06/19/24 0644 06/19/24 0832 06/19/24 1134 06/19/24 1725  GLUCAP 126* 102* 104* 105* 99    Signed:  Colen Grimes MD   [facility] 06/23/2024, 9:45 AM      [1]  Allergies Allergen Reactions   Brimonidine Tartrate Shortness Of Breath    Alphagan-Shortness of breath   Brinzolamide Shortness Of Breath    AZOPT- Shortness of breath   Latanoprost Shortness Of Breath    XALATAN- Shortness of breath   Nucynta  [Tapentadol ] Shortness Of Breath   Sulfa Antibiotics Palpitations   Timolol Maleate Shortness Of Breath and Other (See Comments)    TIMOPTIC- Aggravated asthma   Diltiazem Swelling     leg swelling   Rofecoxib Swelling     VIOXX- leg swelling   Vancomycin  Rash    Tolerates oral vanc   Codeine Other (See Comments)    Childhood reaction   Tamsulosin Other (See Comments)    Dizziness    Celecoxib Other (See Comments)    CELLBREX-confusion   Colchicine Diarrhea    diarrhea   Tape Rash

## 2024-06-23 NOTE — Progress Notes (Signed)
 Discharge canceled for today and PTAR are planned for tomorrow No other changes to condition etc. see discharge summary

## 2024-06-23 NOTE — Progress Notes (Signed)
 " Daily Progress Note   Patient Name: Tim [name]       Date: 06/23/2024 DOB: Oct 08, 1940  Age: 83 y.o. MRN#: [id number] Attending Physician: Samtani, Jai-Gurmukh, MD Primary Care Physician: Tim Aleene DEL, MD Admit Date: 06/05/2024 Length of Stay: 17 days  Reason for Consultation/Follow-up: Establishing goals of care  Subjective:  Subjective: Chart reviewed including review of most recent notes from hospitalist, TOC, and palliative care.  Note plan to transition home with home hospice through [facility] today.  I met with patient and his wife in conjunction with Dr. Samtani.  Patient reports that he is not having pain or shortness of breath.  He is looking forward to going home, but he remains concerned about his wife as she is worried about plan for him to be at home.  Wife is overwhelmed and reports she has gotten some of the house ready for his return.  She has been feeling very nervous about his transition, but she is aware that he does not meet criteria for inpatient hospice at this time.  Review of Systems Denies complaints reports being anxious about going home, but also looking forward to being at home Objective:   Vital Signs:  BP 135/60 (BP Location: Right Arm)   Pulse 80   Temp 98.1 F (36.7 C) (Oral)   Resp 18   Ht 6' 4 (1.93 m)   Wt 118.2 kg   SpO2 99%   BMI 31.72 kg/m   Physical Exam: General: NAD, alert Eyes:  anicteric sclera HENT: normocephalic, atraumatic, moist mucous membranes Cardiovascular: RRR Respiratory: no increased work of breathing noted, not in respiratory distress Abdomen: not distended Neuro: A&O, following commands easily Psych: flat affect, does smile with discussion of being home  Imaging: @IMAGES @  I personally reviewed recent imaging.   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  Recommendations/Plan: # Complex medical decision making/goals of care:  - Patient does not meet criteria for inpatient hospice.  - Plan for transition home today with [facility] for home hospice  - Seen today for symptom check and appears stable and well managed on current regimen of scheduled oxycodone .  - Emotional support provided to wife at bedside.  Dr. Samtani to reach out to hospice agency for continued support for her as work to discharge home. -  Code Status: Do not attempt resuscitation (DNR) - Comfort care  # Symptom management at discharge  - Pain: oxycodone , gabapenting  - Anxiety: Ativan  as needed  - Agitation: Haldol  as needed  - Nausea: Haldol  as needed  Prognosis: < 6 months  # Discharge Planning: Home with hospice today  Discussed with: patient, wife, bedside care team Thank you for allowing the palliative care team to participate in the care Tim [name].  Tim Meissner, MD Palliative Care Provider PMT # [phone]  If patient remains symptomatic despite maximum doses, please call PMT at [phone] between 0700 and 1900. Outside of these hours, please call attending, as PMT does not have night coverage.   I personally spent a total of 38 minutes in the care of the patient today including preparing to see the patient, getting/reviewing separately obtained history, performing a medically appropriate exam/evaluation, counseling and educating, referring and communicating with other health care professionals, and documenting clinical information  in the EHR.   "

## 2024-06-23 NOTE — Progress Notes (Signed)
 RN informed by MD that pt's wife sat on the floor and needed assistance to help with bathroom since pt's wife appeared tired and weak.  Assisted pt back to rest on the couch and offered fluid.  [name], RN

## 2024-06-23 NOTE — TOC Transition Note (Addendum)
 Transition of Care [facility]) - Discharge Note   Patient Details  Name: Tim [name] MRN: [id number] Date of Birth: 17-Apr-1941  Transition of Care [name]) CM/SW Contact:  Tom-Johnson, Harvest Muskrat, RN Phone Number: 06/23/2024, 9:46 AM   Clinical Narrative:     Patient is scheduled for discharge with [facility] today.  Readmission Risk Assessment done. Hospital f/u and discharge instructions on AVS. [facility] scheduled to transport at discharge,pickup time is 12:30 pm.  CM spoke with Rosaline at [facility] about discharge plans and also informed with wife, [name] about discharge. DME's will be delivered to patient's home today by [facility].  No further ICM needs noted.  12:30- Discharged cancelled, wife overwhelmed about discharge, sat self on the floor in patient's room with MD present. CM spoke with wife and she states she's having Diarrhea and feels weak. States she will be okay with patient discharging home tomorrow 06/23/24.  CM notified Rosaline with [facility] and also cancelled PTAR.   CM will continue to follow as patient progresses towards discharge.      Final next level of care: Home w Hospice Care Barriers to Discharge: Barriers Resolved   Patient Goals and CMS Choice Patient states their goals for this hospitalization and ongoing recovery are:: To return home with Hospice CMS Medicare.gov Compare Post Acute Care list provided to:: Patient Choice offered to / list presented to : Patient, Spouse      Discharge Placement                Patient to be transferred to facility by: PTAR Name of family member notified: Wife, Marie    Discharge Plan and Services Additional resources added to the After Visit Summary for   In-house Referral: Clinical Social Work Discharge Planning Services: [employer] Consult Post Acute Care Choice: Home Health, Resumption of Svcs/PTA Provider          DME Arranged: [name] DME Agency: NA       HH Arranged: NA HH Agency:  NA Date HH Agency Contacted: 06/10/24 Time HH Agency Contacted: 1455 Representative spoke with at [facility] Agency: [facility]  Social Drivers of Health (SDOH) Interventions SDOH Screenings   Food Insecurity: No Food Insecurity (06/07/2024)  Housing: Low Risk (06/07/2024)  Recent Concern: Housing - High Risk (05/01/2024)   Received from [facility]  Transportation Needs: No Transportation Needs (06/07/2024)  Utilities: Not At Risk (06/07/2024)  Depression (PHQ2-9): Low Risk (03/31/2024)  Financial Resource Strain: Low Risk (09/23/2023)  Physical Activity: Unknown (09/23/2023)  Social Connections: Moderately Isolated (06/07/2024)  Stress: No Stress Concern Present (05/01/2024)   Received from [facility]  Tobacco Use: Low Risk (06/05/2024)     Readmission Risk Interventions    06/23/2024    9:45 AM 04/15/2024    3:20 PM 04/14/2024    4:33 PM  Readmission Risk Prevention Plan  Transportation Screening Complete Complete Complete  PCP or Specialist Appt within 5-7 Days  Complete Complete  PCP or Specialist Appt within 3-5 Days Complete    Home Care Screening  Complete Complete  Medication Review (RN CM)  Complete Referral to Pharmacy  HRI or Home Care Consult Complete    Social Work Consult for Recovery Care Planning/Counseling Complete    Palliative Care Screening Complete    Medication Review Oceanographer) Referral to Pharmacy

## 2024-06-24 MED ORDER — ONDANSETRON 4 MG PO TBDP
4.0000 mg | ORAL_TABLET | Freq: Three times a day (TID) | ORAL | Status: DC | PRN
  Filled 2024-06-24: qty 1

## 2024-06-24 NOTE — Progress Notes (Signed)
 Lower back and buttock issue with skin breakdown today according to wife--pain is controlled on oral meds No fever no chills eating well Stable for discharge with hospice when able  Reggie Grimes, MD [facility] 9:10 AM

## 2024-06-24 NOTE — Progress Notes (Signed)
 During am rounds, pt midline lying on tray table, pt states, he doesn't know what happened, no bleeding or edema noted, able to place #[id number] to [facility] to maintain IV access. CN made aware.

## 2024-06-24 NOTE — Plan of Care (Signed)
" °  Problem: Education: Goal: Knowledge of General Education information will improve Description: Including pain rating scale, medication(s)/side effects and non-pharmacologic comfort measures Outcome: Progressing   Problem: Education: Goal: Knowledge of General Education information will improve Description: Including pain rating scale, medication(s)/side effects and non-pharmacologic comfort measures Outcome: Progressing   Problem: Activity: Goal: Risk for activity intolerance will decrease Outcome: Progressing   Problem: Nutrition: Goal: Adequate nutrition will be maintained Outcome: Progressing   Problem: Coping: Goal: Level of anxiety will decrease Outcome: Progressing   Problem: Pain Managment: Goal: General experience of comfort will improve and/or be controlled Outcome: Progressing   "

## 2024-06-24 NOTE — Plan of Care (Signed)

## 2024-06-24 NOTE — TOC Progression Note (Signed)
 Transition of Care [facility]) - Progression Note    Patient Details  Name: Tim [name] MRN: [id number] Date of Birth: 13-Apr-1941  Transition of Care [name]) CM/SW Contact  Tom-Johnson, Dilara Navarrete Daphne, RN Phone Number: 06/24/2024, 9:48 AM  Clinical Narrative:     CM informed by Rosaline with [facility] that they will not be able to admit patient until tomorrow 06/25/24 d/t DME delivering later today. Rosaline requested PTAR arranged for 12:30 pm pickup time. MD and RN notified.  CM will continue to follow as patient progresses towards discharge.        Expected Discharge Plan: Skilled Nursing Facility Barriers to Discharge: Barriers Resolved               Expected Discharge Plan and Services In-house Referral: Clinical Social Work Discharge Planning Services: CM Consult Post Acute Care Choice: Home Health, Resumption of Svcs/PTA Provider Living arrangements for the past 2 months: Single Family Home                 DME Arranged: [name] DME Agency: NA       HH Arranged: NA HH Agency: NA Date HH Agency Contacted: 06/10/24 Time HH Agency Contacted: 1455 Representative spoke with at [facility] Agency: [facility]   Social Drivers of Health (SDOH) Interventions SDOH Screenings   Food Insecurity: No Food Insecurity (06/07/2024)  Housing: Low Risk (06/07/2024)  Recent Concern: Housing - High Risk (05/01/2024)   Received from [facility]  Transportation Needs: No Transportation Needs (06/07/2024)  Utilities: Not At Risk (06/07/2024)  Depression (PHQ2-9): Low Risk (03/31/2024)  Financial Resource Strain: Low Risk (09/23/2023)  Physical Activity: Unknown (09/23/2023)  Social Connections: Moderately Isolated (06/07/2024)  Stress: No Stress Concern Present (05/01/2024)   Received from [facility]  Tobacco Use: Low Risk (06/05/2024)    Readmission Risk Interventions    06/23/2024    9:45 AM 04/15/2024    3:20 PM 04/14/2024    4:33 PM  Readmission Risk Prevention Plan   Transportation Screening Complete Complete Complete  PCP or Specialist Appt within 5-7 Days  Complete Complete  PCP or Specialist Appt within 3-5 Days Complete    Home Care Screening  Complete Complete  Medication Review (RN CM)  Complete Referral to Pharmacy  HRI or Home Care Consult Complete    Social Work Consult for Recovery Care Planning/Counseling Complete    Palliative Care Screening Complete    Medication Review Oceanographer) Referral to Pharmacy

## 2024-06-25 DIAGNOSIS — A419 Sepsis, unspecified organism: Secondary | ICD-10-CM | POA: Diagnosis not present

## 2024-06-25 DIAGNOSIS — R6521 Severe sepsis with septic shock: Secondary | ICD-10-CM | POA: Diagnosis not present

## 2024-06-25 NOTE — TOC Transition Note (Signed)
 Transition of Care [facility]) - Discharge Note   Patient Details  Name: Tim [name] MRN: [id number] Date of Birth: [birth year]  Transition of Care [facility]) CM/SW Contact:  Tim KANDICE Stain, Tim [name] Phone Number: 06/25/2024, 10:29 AM   Clinical Narrative:     Patient stable to discharge home with home hospice. Becky with [facility] states patient will be admitted at 1400 today.  Spoke to wife, Tim [name], who confirms DME have been delivered and requesting PTAR pick up patient at 1230. PTAR notified.    Final next level of care: Home w Hospice Care Barriers to Discharge: Barriers Resolved   Patient Goals and CMS Choice Patient states their goals for this hospitalization and ongoing recovery are:: To return home with Hospice CMS Medicare.gov Compare Post Acute Care list provided to:: Patient Choice offered to / list presented to : Patient, Spouse      Discharge Placement             Home   Patient to be transferred to facility by: PTAR Name of family member notified: Wife, Tim [name]    Discharge Plan and Services Additional resources added to the After Visit Summary for   In-house Referral: Clinical Social Work Discharge Planning Services: [employer] Consult Post Acute Care Choice: Home Health, Resumption of Svcs/PTA Provider          DME Arranged: [name] DME Agency: NA       HH Arranged: NA HH Agency: NA Date HH Agency Contacted: 06/10/24 Time HH Agency Contacted: 1455 Representative spoke with at [facility] Agency: [facility]  Social Drivers of Health (SDOH) Interventions SDOH Screenings   Food Insecurity: No Food Insecurity (06/07/2024)  Housing: Low Risk (06/07/2024)  Recent Concern: Housing - High Risk (05/01/2024)   Received from [facility]  Transportation Needs: No Transportation Needs (06/07/2024)  Utilities: Not At Risk (06/07/2024)  Depression (PHQ2-9): Low Risk (03/31/2024)  Financial Resource Strain: Low Risk (09/23/2023)  Physical Activity: Unknown (09/23/2023)  Social Connections:  Moderately Isolated (06/07/2024)  Stress: No Stress Concern Present (05/01/2024)   Received from [facility]  Tobacco Use: Low Risk (06/05/2024)     Readmission Risk Interventions    06/23/2024    9:45 AM 04/15/2024    3:20 PM 04/14/2024    4:33 PM  Readmission Risk Prevention Plan  Transportation Screening Complete Complete Complete  PCP or Specialist Appt within 5-7 Days  Complete Complete  PCP or Specialist Appt within 3-5 Days Complete    Home Care Screening  Complete Complete  Medication Review (Tim [name] CM)  Complete Referral to Pharmacy  HRI or Home Care Consult Complete    Social Work Consult for Recovery Care Planning/Counseling Complete    Palliative Care Screening Complete    Medication Review Oceanographer) Referral to Pharmacy

## 2024-06-25 NOTE — Discharge Summary (Signed)
 Physician Discharge Summary  Tim [name] FMW:[id number] DOB: 11-20-1940 DOA: 06/05/2024  PCP: [name], MD  Admit date: 06/05/2024 Discharge date: 06/25/2024  Pain in sacral/rectal area is improved---no changes to overall plan of care other than turn every 2 hourly and continue pain management etc.  Time spent: 35 minutes  Recommendations for Outpatient Follow-up:  Patient discharging home with care of [facility]  Discharge Diagnoses:  MAIN problem for hospitalization   Septic shock POA 2/2 Klebsiella E. coli bacteremia--now hospice trajectory  Please see below for itemized issues addressed in HOpsital- refer to other progress notes for clarity if needed  Discharge Condition: Guarded  Diet recommendation: Regular  Filed Weights   06/14/24 0245 06/15/24 0500 06/19/24 0500  Weight: 113.9 kg 118.2 kg 118.2 kg    History of present illness:  83 y.o. M with obesity, CAD + PCI, TAVR, history enterococcal prosthetic valve endocarditis, pacemaker infection follows with Dr. Cornelia [name] EP, history CHF, chronic pain, A-fib, diabetes, COPD and BPH who presented with shock, found to have E. coli and Klebsiella bacteremia due to UTI due to chronic indwelling Foley.  Admitted to ICU  12/5 on Levophed ---> Urology, hospitalist, psychiatry all were consulted-patient was transferred 12/10 to the hospitalist service he was treated for septic shock---had a Three-way Foley catheter for hematuria which was downsized-blood complications of possible ileus versus gastroparesis-cardiology was involved in some of the discussions with regards to planning for his  defibrillator battery replacement was supposed to have been transferred from meropenem  to augmentin -- 12/18 patient decided on comfort measures and has stabilized reliably for discharge home with hospice and wife taking care of   Assessment and plan: Septic shock due to Klebsiella pneumoniae and E coli bacteremia due to UTI 2  chronic indwelling Foley 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 Transaminitis Depression Class I obesity   Patient was ultimately seen by palliative care and decision was made 12/19 to deactivate ICD-patient wanted to go home with hospice support under the care of [facility]-meds were changed to reflect prescriptions were given at discharge for the same  Patient and family were appreciative of care input and delineation of goals during hospitalization and patient stabilized during hospital stay-patient will likely discharge home with [facility] and transportation later today  Wound care to be provided prior to discharge to lower buttocks--patient to be turned regularly with hospice and patient also has a dressing on the area as best able   Discharge Exam: Vitals:   06/24/24 1927 06/25/24 0742  BP: 117/61 (!) 113/42  Pulse: 92 83  Resp: 19 18  Temp: 98.2 F (36.8 C) 98.2 F (36.8 C)  SpO2: 96% 97%    Subj on day of d/c   Coherent alert S1-S2 no murmur Abdomen soft Foley in place Wounds not examined on lower extremities  Discharge Instructions    Allergies as of 06/25/2024       Reactions   Brimonidine Tartrate Shortness Of Breath   Alphagan-Shortness of breath   Brinzolamide Shortness Of Breath   AZOPT- Shortness of breath   Latanoprost Shortness Of Breath   XALATAN- Shortness of breath   Nucynta  [tapentadol ] Shortness Of Breath   Sulfa Antibiotics Palpitations   Timolol Maleate Shortness Of Breath, Other (  See Comments)   TIMOPTIC- Aggravated asthma   Diltiazem Swelling    leg swelling   Rofecoxib  Swelling    VIOXX- leg swelling   Vancomycin  Rash   Tolerates oral vanc   Codeine Other (See Comments)   Childhood reaction   Tamsulosin Other (See Comments)   Dizziness    Celecoxib Other (See Comments)   CELLBREX-confusion   Colchicine Diarrhea   diarrhea   Tape Rash        Medication List     STOP taking these medications    allopurinol  300 MG tablet Commonly known as: ZYLOPRIM    amLODipine 5 MG tablet Commonly known as: NORVASC   amoxicillin  500 MG capsule Commonly known as: AMOXIL    apixaban  5 MG Tabs tablet Commonly known as: ELIQUIS    Aspirin  Low Dose 81 MG tablet Generic drug: aspirin  EC   B-complex with vitamin C  tablet   carvedilol 3.125 MG tablet Commonly known as: COREG   clotrimazole -betamethasone  cream Commonly known as: LOTRISONE    docusate sodium  100 MG capsule Commonly known as: COLACE   ezetimibe  10 MG tablet Commonly known as: ZETIA    ferrous sulfate 325 (65 FE) MG tablet   FIBER PO   fluticasone  50 MCG/ACT nasal spray Commonly known as: FLONASE    fluticasone -salmeterol 250-50 MCG/ACT Aepb Commonly known as: Wixela Inhub   gabapentin  800 MG tablet Commonly known as: NEURONTIN  Replaced by: gabapentin  100 MG capsule   losartan  25 MG tablet Commonly known as: COZAAR    MULTIVITAMIN ADULT PO   naloxone  4 MG/0.1ML Liqd nasal spray kit Commonly known as: NARCAN    niacin  1000 MG CR tablet Commonly known as: NIASPAN    pantoprazole  40 MG tablet Commonly known as: PROTONIX    potassium chloride  SA 20 MEQ tablet Commonly known as: KLOR-CON  M   ranolazine  500 MG 12 hr tablet Commonly known as: RANEXA    rosuvastatin  20 MG tablet Commonly known as: CRESTOR    VITAMIN D (CHOLECALCIFEROL) PO       TAKE these medications    albuterol  108 (90 Base) MCG/ACT inhaler Commonly known as: VENTOLIN  HFA TAKE 2 PUFFS BY MOUTH EVERY 6 HOURS AS NEEDED FOR WHEEZE OR SHORTNESS OF BREATH   finasteride  5 MG tablet Commonly known as:  PROSCAR  Take 1 tablet (5 mg total) by mouth daily.   gabapentin  100 MG capsule Commonly known as: NEURONTIN  Take 1 capsule (100 mg total) by mouth 3 (three) times daily. Replaces: gabapentin  800 MG tablet   haloperidol  2 MG/ML solution Commonly known as: HALDOL  Place 0.3 mLs (0.6 mg total) under the tongue every 4 (four) hours as needed for agitation (or delirium).   loratadine 10 MG tablet Commonly known as: CLARITIN Take 10 mg by mouth daily.   LORazepam  2 MG/ML concentrated solution Commonly known as: ATIVAN  Place 0.5 mLs (1 mg total) under the tongue every 4 (four) hours as needed for anxiety.   mirtazapine  15 MG tablet Commonly known as: REMERON  Take 1 tablet (15 mg total) by mouth at bedtime.   nitroGLYCERIN  0.4 MG SL tablet Commonly known as: NITROSTAT  Place 0.4 mg under the tongue every 5 (five) minutes as needed for chest pain.   nystatin  powder Commonly known as: MYCOSTATIN /NYSTOP  Apply topically 2 (two) times daily.   Oxycodone  HCl 20 MG Tabs Take 1 tablet (20 mg total) by mouth every 6 (six) hours as needed. 1 tab po bid prn severe pain What changed:  how much to take how to take this when to take this reasons to take this  torsemide  20 MG tablet Commonly known as: DEMADEX  Take 1 tablet (20 mg total) by mouth daily. What changed:  when to take this reasons to take this       Allergies[1]    The results of significant diagnostics from this hospitalization (including imaging, microbiology, ancillary and laboratory) are listed below for reference.    Significant Diagnostic Studies: DG Abd 1 View Result Date: 06/14/2024 EXAM: 1 VIEW XRAY OF THE ABDOMEN 06/14/2024 11:56:00 AM COMPARISON: 06/13/2024 CLINICAL HISTORY: Ileus (HCC) FINDINGS: LINES, TUBES AND DEVICES: Partially visualized spinal cord stimulator noted. Status post TAVR (Transcatheter Aortic Valve Replacement). BOWEL: Mild gaseous distension of the transverse colon, decreased from prior.  SOFT TISSUES: Cholecystectomy clips noted. No abnormal calcifications. BONES: No acute fracture. IMPRESSION: 1. Mild gaseous distension of the transverse colon, decreased from prior. Electronically signed by: Pinkie Pebbles MD 06/14/2024 05:58 PM EST RP Workstation: [id number]   DG Abd 1 View Result Date: 06/13/2024 CLINICAL DATA:  Nausea and vomiting. EXAM: ABDOMEN - 1 VIEW COMPARISON:  None Available. FINDINGS: Gaseous distension of the stomach is seen. Mild gaseous distension of transverse colon also noted. No dilated small bowel loops seen. Considerations include ileus and gastroparesis. Neurostimulator device seen overlying the a right abdomen. Prior cholecystectomy clips also noted. IMPRESSION: Gaseous distension of the stomach and transverse colon. Considerations include ileus and gastroparesis. Electronically Signed   By: Norleen DELENA Kil M.D.   On: 06/13/2024 11:57   US  EKG SITE RITE Result Date: 06/10/2024 If Site Rite image not attached, placement could not be confirmed due to current cardiac rhythm.  CT CHEST ABDOMEN PELVIS WO CONTRAST Result Date: 06/06/2024 CLINICAL DATA:  Sepsis, altered level of consciousness EXAM: CT CHEST, ABDOMEN AND PELVIS WITHOUT CONTRAST TECHNIQUE: Multidetector CT imaging of the chest, abdomen and pelvis was performed following the standard protocol without IV contrast. RADIATION DOSE REDUCTION: This exam was performed according to the departmental dose-optimization program which includes automated exposure control, adjustment of the mA and/or kV according to patient size and/or use of iterative reconstruction technique. COMPARISON:  04/17/2024, 06/06/2024 FINDINGS: CT CHEST FINDINGS Cardiovascular: Unenhanced imaging of the heart is unremarkable without pericardial effusion. Multi lead cardiac pacer and aortic valve prosthesis are noted. Normal caliber of the thoracic aorta. Atherosclerosis of the aorta and coronary vasculature. Assessment of the vascular lumen cannot  be performed without intravenous contrast. Mediastinum/Nodes: Nonspecific segmental air esophagram. Thyroid  and trachea are unremarkable. No pathologic adenopathy. Lungs/Pleura: Trace free-flowing bilateral pleural effusions with minimal dependent lower lobe atelectasis. No acute airspace disease. No pneumothorax. The central airways are patent. Musculoskeletal: No acute or destructive bony abnormalities. Spinal cord stimulator lead within the midthoracic central canal. Reconstructed images demonstrate no additional findings. CT ABDOMEN PELVIS FINDINGS Hepatobiliary: Cholecystectomy. Unremarkable unenhanced appearance to the liver. Stable expected postsurgical dilatation of the common bile duct. Pancreas: Unremarkable unenhanced appearance. Spleen: Unremarkable unenhanced appearance. Adrenals/Urinary Tract: No urinary tract calculi or obstructive uropathy within either kidney. The adrenals are unremarkable. Bladder is decompressed with a Foley catheter, limiting its evaluation. Stomach/Bowel: No bowel obstruction or ileus. Normal retrocecal appendix. Scattered sigmoid diverticulosis. Mild wall thickening of the distal rectosigmoid colon may reflect inflammatory or infectious colitis versus diverticulitis. Vascular/Lymphatic: Aortic atherosclerosis. Left common femoral venous and arterial catheters. No pathologic adenopathy. Reproductive: Prostate is unremarkable. Other: No free fluid or free intraperitoneal gas. No abdominal wall hernia. Musculoskeletal: No acute or destructive bony abnormalities. Diffuse osteopenia. Reconstructed images demonstrate no additional findings. IMPRESSION: 1. Mild wall thickening of the distal rectosigmoid colon, favor inflammatory  or infectious colitis over diverticulitis. 2. Trace bilateral pleural effusions with minimal dependent lower lobe atelectasis. 3. Aortic Atherosclerosis (ICD10-I70.0). Coronary artery atherosclerosis. Electronically Signed   By: Ozell Daring M.D.   On:  06/06/2024 14:38   CT HEAD WO CONTRAST ( ) Result Date: 06/06/2024 CLINICAL DATA:  Mental status change, persistent or worsening. EXAM: CT HEAD WITHOUT CONTRAST TECHNIQUE: Contiguous axial images were obtained from the base of the skull through the vertex without intravenous contrast. RADIATION DOSE REDUCTION: This exam was performed according to the departmental dose-optimization program which includes automated exposure control, adjustment of the mA and/or kV according to patient size and/or use of iterative reconstruction technique. COMPARISON:  Head CT 04/10/2024 FINDINGS: Brain: No intracranial hemorrhage, mass effect, or midline shift. Stable brain volume. No hydrocephalus. The basilar cisterns are patent. 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 No evidence of territorial infarct or acute ischemia. No extra-axial or intracranial fluid collection. Vascular: Atherosclerosis of skullbase vasculature without hyperdense vessel or abnormal calcification. Skull: No fracture or focal lesion. Sinuses/Orbits: No acute findings. Postoperative change in the left orbit. Minor mucosal thickening of ethmoid air cells. No mastoid effusion. Other: None. IMPRESSION: 1. No acute intracranial abnormality. 2. Remote right cerebellar infarct. Electronically Signed   By: Andrea Gasman M.D.   On: 06/06/2024 14:33   VAS US  LOWER EXTREMITY VENOUS (DVT) Result Date: 06/06/2024  Lower Venous DVT Study Patient Name:  GRANVEL PROUDFOOT  Date of Exam:   06/06/2024 Medical Rec #: [id number]        Accession #:    [id number] Date of Birth: 31-May-1941        Patient Gender: M Patient Age:   10 years Exam Location:  [facility] Procedure:      VAS US  LOWER EXTREMITY VENOUS (DVT) Referring Phys: NORLEEN PAYNE --------------------------------------------------------------------------------  Indications: Swelling, and Edema.  Comparison Study: Previous study of the left lower extremity on 10.13.2025. Performing Technologist:  Edilia Elden Appl  Examination Guidelines: A complete evaluation includes B-mode imaging, spectral Doppler, color Doppler, and power Doppler as needed of all accessible portions of each vessel. Bilateral testing is considered an integral part of a complete examination. Limited examinations for reoccurring indications may be performed as noted. The reflux portion of the exam is performed with the patient in reverse Trendelenburg.  +---------+---------------+---------+-----------+----------+--------------+ RIGHT    CompressibilityPhasicitySpontaneityPropertiesThrombus Aging +---------+---------------+---------+-----------+----------+--------------+ CFV      Full           Yes      Yes                                 +---------+---------------+---------+-----------+----------+--------------+ SFJ      Full           Yes      Yes                                 +---------+---------------+---------+-----------+----------+--------------+ FV Prox  Full                                                        +---------+---------------+---------+-----------+----------+--------------+ FV Mid   Full                                                        +---------+---------------+---------+-----------+----------+--------------+  FV DistalFull                                                        +---------+---------------+---------+-----------+----------+--------------+ PFV      Full                                                        +---------+---------------+---------+-----------+----------+--------------+ POP      Full           Yes      Yes                                 +---------+---------------+---------+-----------+----------+--------------+ PTV      Full                                                        +---------+---------------+---------+-----------+----------+--------------+ PERO     Full                                                         +---------+---------------+---------+-----------+----------+--------------+   +---------+---------------+---------+-----------+----------+-------------------+ LEFT     CompressibilityPhasicitySpontaneityPropertiesThrombus Aging      +---------+---------------+---------+-----------+----------+-------------------+ CFV                                                   Not well visualized +---------+---------------+---------+-----------+----------+-------------------+ SFJ                                                   Not well visualized +---------+---------------+---------+-----------+----------+-------------------+ FV Prox  Full           Yes      Yes                                      +---------+---------------+---------+-----------+----------+-------------------+ FV Mid   Full                                                             +---------+---------------+---------+-----------+----------+-------------------+ FV DistalFull                                                             +---------+---------------+---------+-----------+----------+-------------------+  PFV      Full                                                             +---------+---------------+---------+-----------+----------+-------------------+ POP      Full           Yes      Yes                                      +---------+---------------+---------+-----------+----------+-------------------+ PTV      Full                                                             +---------+---------------+---------+-----------+----------+-------------------+ PERO     Full                                                             +---------+---------------+---------+-----------+----------+-------------------+ Left common femoral vein and great saphenous vein at the saphenofemoral junction were not visualized due to CVC bandages.     Summary: RIGHT: - There is no evidence  of deep vein thrombosis in the lower extremity.  - No cystic structure found in the popliteal fossa.  LEFT: - There is no evidence of deep vein thrombosis in the lower extremity. However, portions of this examination were limited- see technologist comments above.  - No cystic structure found in the popliteal fossa.  *See table(s) above for measurements and observations. Electronically signed by Debby Robertson on 06/06/2024 at 2:13:21 PM.    Final    DG Chest Port 1 View Result Date: 06/06/2024 EXAM: 1 VIEW(S) XRAY OF THE CHEST 06/06/2024 12:55:00 AM COMPARISON: Comparison with 04/10/2024. CLINICAL HISTORY: Questionable sepsis - evaluate for abnormality. FINDINGS: LINES, TUBES AND DEVICES: TAVR. Left chest wall icd. Spine stimulator leads project over the thoracic spine. LUNGS AND PLEURA: The right apex is obscured. No focal pulmonary opacity. No pleural effusion. No pneumothorax. HEART AND MEDIASTINUM: Stable cardiomediastinal silhouette given the patient rotation. BONES AND SOFT TISSUES: No acute osseous abnormality. IMPRESSION: 1. No acute cardiopulmonary abnormality identified. Electronically signed by: Norman Gatlin MD 06/06/2024 01:00 AM EST RP Workstation: [id number]    Microbiology: No results found for this or any previous visit (from the past 240 hours).   Labs: Basic Metabolic Panel: Recent Labs  Lab 06/19/24 0343  MG 1.9   Liver Function Tests: No results for input(s): AST, ALT, ALKPHOS, BILITOT, PROT, ALBUMIN in the last 168 hours. No results for input(s): LIPASE, AMYLASE in the last 168 hours. No results for input(s): AMMONIA in the last 168 hours. CBC: No results for input(s): WBC, NEUTROABS, HGB, HCT, MCV, PLT in the last 168 hours.  Cardiac Enzymes: No results for input(s): CKTOTAL, CKMB, CKMBINDEX, TROPONINI in the last 168 hours. BNP: BNP (last 3 results) Recent Labs    04/10/24 0548  BNP 60.6  ProBNP (last 3 results) No  results for input(s): PROBNP in the last 8760 hours.  CBG: Recent Labs  Lab 06/18/24 2102 06/19/24 0644 06/19/24 0832 06/19/24 1134 06/19/24 1725  GLUCAP 126* 102* 104* 105* 99    Signed:  Colen Grimes MD   [facility] 06/25/2024, 1:40 PM       [1]  Allergies Allergen Reactions   Brimonidine Tartrate Shortness Of Breath    Alphagan-Shortness of breath   Brinzolamide Shortness Of Breath    AZOPT- Shortness of breath   Latanoprost Shortness Of Breath    XALATAN- Shortness of breath   Nucynta  [Tapentadol ] Shortness Of Breath   Sulfa Antibiotics Palpitations   Timolol Maleate Shortness Of Breath and Other (See Comments)    TIMOPTIC- Aggravated asthma   Diltiazem Swelling     leg swelling   Rofecoxib Swelling     VIOXX- leg swelling   Vancomycin  Rash    Tolerates oral vanc   Codeine Other (See Comments)    Childhood reaction   Tamsulosin Other (See Comments)    Dizziness    Celecoxib Other (See Comments)    CELLBREX-confusion   Colchicine Diarrhea    diarrhea   Tape Rash

## 2024-06-27 ENCOUNTER — Telehealth: Payer: Self-pay

## 2024-06-27 NOTE — Transitions of Care (Post Inpatient/ED Visit) (Signed)
" ° °  06/27/2024  Name: Tim [name] MRN: [id number] DOB: 1940/07/16  Today's TOC FU Call Status: Today's TOC FU Call Status:: Successful TOC FU Call Completed TOC FU Call Complete Date: 06/27/24  Patient's Name and Date of Birth confirmed. Name, DOB  Transition Care Management Follow-up Telephone Call Date of Discharge: 06/25/24 Discharge Facility: [name]) Type of Discharge: Inpatient Admission Primary Inpatient Discharge Diagnosis:: septic shock How have you been since you were released from the hospital?: Same Any questions or concerns?: No    Spoke with wife who states that [facility] is active. Reports that she has all the equipment needed. Reports that nurse has been out the last 2 days and is coming again today.  Wife denies any concerns today.   Alan Ee, RN, BSN, CEN Population Health- Transition of Care Team.  [facility] [phone]          "

## 2024-07-10 ENCOUNTER — Other Ambulatory Visit: Payer: Self-pay | Admitting: Family Medicine

## 2024-07-10 NOTE — Telephone Encounter (Signed)
 Copied from CRM #[id number]. Topic: Clinical - Medication Refill >> Jul 10, 2024  1:22 PM Ahlexyia S wrote: Medication: finasteride  (PROSCAR ) 5 MG tablet  Has the patient contacted their pharmacy? No, spouse stated she wanted to contact clinic first. (Agent: If no, request that the patient contact the pharmacy for the refill. If patient does not wish to contact the pharmacy document the reason why and proceed with request.) (Agent: If yes, when and what did the pharmacy advise?)  This is the patient's preferred pharmacy:  [facility] [phone] - [place], [place] - [address] [place] [address] Phone: [phone] Fax: [phone]  Is this the correct pharmacy for this prescription? Yes If no, delete pharmacy and type the correct one.   Has the prescription been filled recently? Spouse is unsure  Is the patient out of the medication? Yes  Has the patient been seen for an appointment in the last year OR does the patient have an upcoming appointment? Yes  Can we respond through MyChart? No, prefers phone calls.  Agent: Please be advised that Rx refills may take up to 3 business days. We ask that you follow-up with your pharmacy.

## 2024-07-11 ENCOUNTER — Emergency Department (HOSPITAL_COMMUNITY)
Admission: EM | Admit: 2024-07-11 | Discharge: 2024-07-11 | Disposition: A | Discharge status: Home / Self Care | Attending: Emergency Medicine | Admitting: Emergency Medicine

## 2024-07-11 ENCOUNTER — Other Ambulatory Visit: Payer: Self-pay

## 2024-07-11 ENCOUNTER — Encounter (HOSPITAL_COMMUNITY): Payer: Self-pay | Admitting: Emergency Medicine

## 2024-07-11 DIAGNOSIS — N3000 Acute cystitis without hematuria: Secondary | ICD-10-CM | POA: Insufficient documentation

## 2024-07-11 DIAGNOSIS — I110 Hypertensive heart disease with heart failure: Secondary | ICD-10-CM | POA: Insufficient documentation

## 2024-07-11 DIAGNOSIS — I509 Heart failure, unspecified: Secondary | ICD-10-CM | POA: Insufficient documentation

## 2024-07-11 DIAGNOSIS — T83098A Other mechanical complication of other indwelling urethral catheter, initial encounter: Secondary | ICD-10-CM | POA: Insufficient documentation

## 2024-07-11 DIAGNOSIS — Y732 Prosthetic and other implants, materials and accessory gastroenterology and urology devices associated with adverse incidents: Secondary | ICD-10-CM | POA: Insufficient documentation

## 2024-07-11 DIAGNOSIS — E119 Type 2 diabetes mellitus without complications: Secondary | ICD-10-CM | POA: Insufficient documentation

## 2024-07-11 DIAGNOSIS — I2510 Atherosclerotic heart disease of native coronary artery without angina pectoris: Secondary | ICD-10-CM | POA: Insufficient documentation

## 2024-07-11 DIAGNOSIS — T839XXA Unspecified complication of genitourinary prosthetic device, implant and graft, initial encounter: Secondary | ICD-10-CM

## 2024-07-11 LAB — BASIC METABOLIC PANEL WITH GFR
Anion gap: 10 (ref 5–15)
BUN: 12 mg/dL (ref 8–23)
CO2: 26 mmol/L (ref 22–32)
Calcium: 9.1 mg/dL (ref 8.9–10.3)
Chloride: 100 mmol/L (ref 98–111)
Creatinine, Ser: 0.59 mg/dL — ABNORMAL LOW (ref 0.61–1.24)
GFR, Estimated: >60 mL/min
Glucose, Bld: 114 mg/dL — ABNORMAL HIGH (ref 70–99)
Potassium: 3.8 mmol/L (ref 3.5–5.1)
Sodium: 135 mmol/L (ref 135–145)

## 2024-07-11 LAB — URINALYSIS, W/ REFLEX TO CULTURE (INFECTION SUSPECTED)
Bilirubin Urine: NEGATIVE
Glucose, UA: NEGATIVE mg/dL
Ketones, ur: NEGATIVE mg/dL
Nitrite: NEGATIVE
Protein, ur: 100 mg/dL — AB
RBC / HPF: >50 RBC/hpf (ref 0–5)
Specific Gravity, Urine: 1.015 (ref 1.005–1.030)
WBC, UA: >50 WBC/hpf (ref 0–5)
pH: 5 (ref 5.0–8.0)

## 2024-07-11 LAB — CBC WITH DIFFERENTIAL/PLATELET
Abs Immature Granulocytes: 0.02 K/uL (ref 0.00–0.07)
Basophils Absolute: 0.1 K/uL (ref 0.0–0.1)
Basophils Relative: 1 %
Eosinophils Absolute: 0.1 K/uL (ref 0.0–0.5)
Eosinophils Relative: 2 %
HCT: 36.9 % — ABNORMAL LOW (ref 39.0–52.0)
Hemoglobin: 12.2 g/dL — ABNORMAL LOW (ref 13.0–17.0)
Immature Granulocytes: 0 %
Lymphocytes Relative: 19 %
Lymphs Abs: 1.2 K/uL (ref 0.7–4.0)
MCH: 30.1 pg (ref 26.0–34.0)
MCHC: 33.1 g/dL (ref 30.0–36.0)
MCV: 91.1 fL (ref 80.0–100.0)
Monocytes Absolute: 1.2 K/uL — ABNORMAL HIGH (ref 0.1–1.0)
Monocytes Relative: 19 %
Neutro Abs: 3.7 K/uL (ref 1.7–7.7)
Neutrophils Relative %: 59 %
Platelets: 138 K/uL — ABNORMAL LOW (ref 150–400)
RBC: 4.05 MIL/uL — ABNORMAL LOW (ref 4.22–5.81)
RDW: 13.3 % (ref 11.5–15.5)
WBC: 6.4 K/uL (ref 4.0–10.5)
nRBC: 0 % (ref 0.0–0.2)

## 2024-07-11 MED ORDER — CEPHALEXIN 500 MG PO CAPS: 500.0000 mg | ORAL_CAPSULE | Freq: Four times a day (QID) | ORAL | 0 refills | Status: AC

## 2024-07-11 MED ORDER — CEPHALEXIN 250 MG PO CAPS
500.0000 mg | ORAL_CAPSULE | Freq: Once | ORAL | Status: AC
  Administered 2024-07-11: 500 mg via ORAL
  Filled 2024-07-11: qty 2

## 2024-07-11 NOTE — ED Provider Notes (Signed)
 " [place] EMERGENCY DEPARTMENT AT [facility] Provider Note   CSN: [id number] Arrival date & time: 07/11/24  1435     Patient presents with: Urinary Retention   Tim [name] is a 84 y.o. male.   HPI   Patient has a history of hypercholesterolemia gout hypertension acid reflux neuropathy obesity hypoventilation syndrome diabetes CHF, non-ST elevation MI, thrombocytopenia, coronary artery disease.  Patient presented to the ER for evaluation of urinary discomfort.  Patient was admitted to the hospital last month for sepsis.  Patient was discharged from the hospital on the 24th.  He began having urinary pain and discomfort.  The catheter was removed by home health staff.  Patient had not urinated since removal of the catheter.  He was sent to the ED for Foley replacement.  Patient had a catheter placed prior to my evaluation.  He states he is feeling better.  He denies any abdominal pain.  Prior to Admission medications  Medication Sig Start Date End Date Taking? Authorizing Provider  cephALEXin  (KEFLEX ) 500 MG capsule Take 1 capsule (500 mg total) by mouth 4 (four) times daily. 07/11/24  Yes Randol Simmonds, MD  albuterol  (VENTOLIN  HFA) 108 [phone] Base) MCG/ACT inhaler TAKE 2 PUFFS BY MOUTH EVERY 6 HOURS AS NEEDED FOR WHEEZE OR SHORTNESS OF BREATH 03/04/24   McGowen, Aleene DEL, MD  finasteride  (PROSCAR ) 5 MG tablet Take 1 tablet (5 mg total) by mouth daily. 02/19/24   McGowen, Aleene DEL, MD  gabapentin  (NEURONTIN ) 100 MG capsule Take 1 capsule (100 mg total) by mouth 3 (three) times daily. 06/23/24   Samtani, Jai-Gurmukh, MD  haloperidol  (HALDOL ) 2 MG/ML solution Place 0.3 mLs (0.6 mg total) under the tongue every 4 (four) hours as needed for agitation (or delirium). 06/23/24   Samtani, Jai-Gurmukh, MD  loratadine (CLARITIN) 10 MG tablet Take 10 mg by mouth daily.    [provider]  LORazepam  (ATIVAN ) 2 MG/ML concentrated solution Place 0.5 mLs (1 mg total) under the tongue every 4 (four)  hours as needed for anxiety. 06/23/24   Samtani, Jai-Gurmukh, MD  mirtazapine  (REMERON ) 15 MG tablet Take 1 tablet (15 mg total) by mouth at bedtime. 06/23/24   Samtani, Jai-Gurmukh, MD  nitroGLYCERIN  (NITROSTAT ) 0.4 MG SL tablet Place 0.4 mg under the tongue every 5 (five) minutes as needed for chest pain. 09/04/17   [provider]  nystatin  (MYCOSTATIN /NYSTOP ) powder Apply topically 2 (two) times daily. 04/18/24   Fausto Sor A, DO  Oxycodone  HCl 20 MG TABS Take 1 tablet (20 mg total) by mouth every 6 (six) hours as needed. 1 tab po bid prn severe pain 06/23/24   Samtani, Jai-Gurmukh, MD  torsemide  (DEMADEX ) 20 MG tablet Take 1 tablet (20 mg total) by mouth daily. 06/23/24   Samtani, Jai-Gurmukh, MD    Allergies: Brimonidine tartrate, Brinzolamide, Latanoprost, Nucynta  [tapentadol ], Sulfa antibiotics, Timolol maleate, Diltiazem, Rofecoxib, Vancomycin , Codeine, Tamsulosin, Celecoxib, Colchicine, and Tape    Review of Systems  Updated Vital Signs BP 112/67 (BP Location: Left Arm)   Pulse 93   Temp 98.7 F (37.1 C) (Oral)   Resp 18   Ht 1.93 m (6' 4)   Wt 118 kg   SpO2 97%   BMI 31.67 kg/m   Physical Exam Vitals and nursing note reviewed.  Constitutional:      Appearance: He is well-developed. He is not diaphoretic.  HENT:     Head: Normocephalic and atraumatic.     Right Ear: External ear normal.  Left Ear: External ear normal.     Mouth/Throat:     Pharynx: No oropharyngeal exudate or posterior oropharyngeal erythema.  Eyes:     General: No scleral icterus.       Right eye: No discharge.        Left eye: No discharge.     Conjunctiva/sclera: Conjunctivae normal.  Neck:     Trachea: No tracheal deviation.  Cardiovascular:     Rate and Rhythm: Normal rate and regular rhythm.  Pulmonary:     Effort: Pulmonary effort is normal. No respiratory distress.     Breath sounds: No stridor.  Abdominal:     General: There is no distension.     Tenderness: There is  no abdominal tenderness. There is no guarding.  Genitourinary:    Comments: Foley catheter in place, appropriately draining yellow-colored urine Musculoskeletal:        General: No swelling.     Cervical back: Neck supple.  Skin:    General: Skin is warm and dry.     Findings: No rash.  Neurological:     Mental Status: He is alert. Mental status is at baseline.     Cranial Nerves: No dysarthria or facial asymmetry.     Motor: No seizure activity.     (all labs ordered are listed, but only abnormal results are displayed) Labs Reviewed  BASIC METABOLIC PANEL WITH GFR - Abnormal; Notable for the following components:      Result Value   Glucose, Bld 114 (*)    Creatinine, Ser 0.59 (*)    All other components within normal limits  CBC WITH DIFFERENTIAL/PLATELET - Abnormal; Notable for the following components:   RBC 4.05 (*)    Hemoglobin 12.2 (*)    HCT 36.9 (*)    Platelets 138 (*)    Monocytes Absolute 1.2 (*)    All other components within normal limits  URINALYSIS, W/ REFLEX TO CULTURE (INFECTION SUSPECTED) - Abnormal; Notable for the following components:   APPearance CLOUDY (*)    Hgb urine dipstick MODERATE (*)    Protein, ur 100 (*)    Leukocytes,Ua LARGE (*)    Bacteria, UA RARE (*)    All other components within normal limits  URINE CULTURE    EKG: None  Radiology: No results found.   Procedures   Medications Ordered in the ED  cephALEXin  (KEFLEX ) capsule 500 mg (has no administration in time range)    Clinical Course as of 07/11/24 1655  Fri Jul 11, 2024  1652 CBC with Differential(!) CBC normal.  Metabolic panel normal.  Urinalysis does show large leukocyte esterase red blood cells and white blood cells. [JK]    Clinical Course User Index [JK] Randol Simmonds, MD                                 Medical Decision Making Labs without signs of significant metabolic abnormalities.  No signs of leukocytosis.  Urinalysis does suggest possibly of infection.   Will send off a urine culture with his urinary discomfort we will treat for possible infection with Keflex      Final diagnoses:  Problem with Foley catheter, initial encounter  Acute cystitis without hematuria    ED Discharge Orders          Ordered    cephALEXin  (KEFLEX ) 500 MG capsule  4 times daily        07/11/24 1655  Randol Simmonds, MD 07/11/24 1655  "

## 2024-07-11 NOTE — ED Notes (Signed)
 Contacted Marie, pt's spouse, regarding pt's discharge with [facility]

## 2024-07-11 NOTE — ED Notes (Signed)
 [facility] here for discharge

## 2024-07-11 NOTE — ED Provider Triage Note (Signed)
 Emergency Medicine Provider Triage Evaluation Note  Tim [name] , a 84 y.o. male  was evaluated in triage.  Pt complains of painful urination.  Pt was admitted on 12/4 for septic shock due to E. Coli and Klebsiella from an indwelling FC.  He was d/c on 12/24 with his foley.  Home health changed his FC today and he began to have a lot of pain, so that catheter was removed.  He has not urinated since the removal.  No fevers.  Review of Systems  Positive: dysuria Negative: fever  Physical Exam  BP 112/67 (BP Location: Left Arm)   Pulse 93   Temp 98.7 F (37.1 C) (Oral)   Resp 18   Ht 6' 4 (1.93 m)   Wt 118 kg   SpO2 97%   BMI 31.67 kg/m  Gen:   Awake, no distress.  Chronically ill. Resp:  Normal effort  MSK:   Diffuse weakness.  Bed bound. Other:    Medical Decision Making  Medically screening exam initiated at 3:15 PM.  Appropriate orders placed.  Tim [name] was informed that the remainder of the evaluation will be completed by another provider, this initial triage assessment does not replace that evaluation, and the importance of remaining in the ED until their evaluation is complete.     Dean Clarity, MD 07/11/24 [phone]

## 2024-07-11 NOTE — Discharge Instructions (Signed)
 Urine did show a possible urinary tract infection.  Take the antibiotics as prescribed.  Follow-up with your doctor to be rechecked

## 2024-07-11 NOTE — TOC CM/SW Note (Signed)
 Received call from [facility]. Patient is on service with them for home hospice.   Merilee Batty, MSN, RN Case Management [phone]

## 2024-07-11 NOTE — ED Triage Notes (Signed)
 Pt to ER from home, home health nurse was out today and replaced foley catheter without difficulty.  Reports that several minutes after catheter was replaced pt began to have spasmodic pain and catheter was leaking.  Home health removed catheter, pain ceased and pt was sent to ER for new catheter.

## 2024-07-11 NOTE — ED Notes (Signed)
 CALLED [facility] ETA 3 HOURS

## 2024-07-13 LAB — URINE CULTURE: Culture: 70000 — AB

## 2024-07-14 ENCOUNTER — Telehealth (HOSPITAL_BASED_OUTPATIENT_CLINIC_OR_DEPARTMENT_OTHER): Payer: Self-pay | Admitting: *Deleted

## 2024-07-14 NOTE — Telephone Encounter (Signed)
 Post ED Visit - Positive Culture Follow-up  Culture report reviewed by antimicrobial stewardship pharmacist: [name] [x]  Sharyne Glatter, Pharm.D. []  Venetia Gully, [address].D., BCPS AQ-ID []  Garrel Crews, Pharm.D., BCPS []  Almarie Lunger, Pharm.D., BCPS []  [place], [address].D., BCPS, AAHIVP []  Rosaline Bihari, Pharm.D., BCPS, AAHIVP []  Vernell Meier, PharmD, BCPS []  Latanya Hint, PharmD, BCPS []  Donald Medley, PharmD, BCPS []  Rocky Bold, PharmD []  Dorothyann Alert, PharmD, BCPS []  Morene Babe, PharmD  [facility] []  Rosaline Edison, PharmD []  Romona Bliss, PharmD []  Dolphus Roller, PharmD []  Veva Seip, Rph []  Vernell Daunt) Leonce, PharmD []  Eva Allis, PharmD []  Rosaline Millet, PharmD []  Iantha Batch, PharmD []  Arvin Gauss, PharmD []  Wanda Hasting, PharmD []  Ronal Rav, PharmD []  Rocky Slade, PharmD []  Bard Jeans, PharmD   Positive urine culture Treated with cephalexin , organism sensitive to the same and no further patient follow-up is required at this time.  Tim [name] 07/14/2024, 10:07 AM

## 2024-07-15 ENCOUNTER — Other Ambulatory Visit: Payer: Self-pay | Admitting: Family Medicine

## 2024-07-15 NOTE — Telephone Encounter (Signed)
 Copied from CRM #[id number]. Topic: Clinical - Medication Refill >> Jul 10, 2024  1:22 PM Ahlexyia S wrote: Medication: finasteride  (PROSCAR ) 5 MG tablet  Has the patient contacted their pharmacy? No, spouse stated she wanted to contact clinic first. (Agent: If no, request that the patient contact the pharmacy for the refill. If patient does not wish to contact the pharmacy document the reason why and proceed with request.) (Agent: If yes, when and what did the pharmacy advise?)  This is the patient's preferred pharmacy:  [facility] [phone] - [place], [place] - [address] [place] [place] [address] Phone: [phone] Fax: [phone]  Is this the correct pharmacy for this prescription? Yes If no, delete pharmacy and type the correct one.   Has the prescription been filled recently? Spouse is unsure  Is the patient out of the medication? Yes  Has the patient been seen for an appointment in the last year OR does the patient have an upcoming appointment? Yes  Can we respond through MyChart? No, prefers phone calls.  Agent: Please be advised that Rx refills may take up to 3 business days. We ask that you follow-up with your pharmacy. >> Jul 15, 2024  3:41 PM China J wrote: [name] HoPatient's wife is calling back to let Dr. Candise know that he was seen at [name] for 3 weeks in December. When discharged on the 24th, he came home and then went to hospice that same day. She said that all information should be in his records.spital for 3 weeks in dec. When discharged on the 24th, he came home and then went to hospice that same day. All information should be in his records. >> Jul 15, 2024  2:44 PM China J wrote: Patient is in hospice and unable to come in for an appointment. Usually she would be able to get his medications sent through the hospice's company's pharmacy but this medication is unable to be refilled there.The patient's wife is wondering  if Dr. McGowen could send this in.  Please sent to [facility] not [facility]. [facility] - [place], [place] - [address] [place] [address]  [address] [place] [address]  Phone: [phone] Fax: [phone]  Hours: Not open 24 hours

## 2024-07-17 ENCOUNTER — Other Ambulatory Visit: Payer: Self-pay

## 2024-07-17 MED ORDER — FINASTERIDE 5 MG PO TABS: 5.0000 mg | ORAL_TABLET | Freq: Every day | ORAL | 0 refills | Status: AC

## 2024-07-17 MED ORDER — FINASTERIDE 5 MG PO TABS: 5.0000 mg | ORAL_TABLET | Freq: Every day | ORAL | 0 refills | Status: DC

## 2024-09-22 ENCOUNTER — Ambulatory Visit: Admitting: Infectious Disease
